# Patient Record
Sex: Female | Born: 1970
Health system: Southern US, Community
[De-identification: ages and names within clinical notes are randomized; demographics above are authoritative.]

## PROBLEM LIST (undated history)

## (undated) DIAGNOSIS — I2510 Atherosclerotic heart disease of native coronary artery without angina pectoris: Secondary | ICD-10-CM

## (undated) DIAGNOSIS — I951 Orthostatic hypotension: Secondary | ICD-10-CM

## (undated) DIAGNOSIS — E039 Hypothyroidism, unspecified: Secondary | ICD-10-CM

## (undated) DIAGNOSIS — E119 Type 2 diabetes mellitus without complications: Secondary | ICD-10-CM

## (undated) DIAGNOSIS — K635 Polyp of colon: Secondary | ICD-10-CM

## (undated) DIAGNOSIS — I712 Thoracic aortic aneurysm, without rupture: Secondary | ICD-10-CM

## (undated) DIAGNOSIS — D500 Iron deficiency anemia secondary to blood loss (chronic): Secondary | ICD-10-CM

## (undated) DIAGNOSIS — Z955 Presence of coronary angioplasty implant and graft: Secondary | ICD-10-CM

## (undated) DIAGNOSIS — I10 Essential (primary) hypertension: Secondary | ICD-10-CM

## (undated) DIAGNOSIS — I639 Cerebral infarction, unspecified: Secondary | ICD-10-CM

## (undated) DIAGNOSIS — D649 Anemia, unspecified: Secondary | ICD-10-CM

## (undated) DIAGNOSIS — A419 Sepsis, unspecified organism: Secondary | ICD-10-CM

## (undated) DIAGNOSIS — Z1371 Encounter for nonprocreative screening for genetic disease carrier status: Secondary | ICD-10-CM

## (undated) DIAGNOSIS — N183 Chronic kidney disease, stage 3 (moderate): Secondary | ICD-10-CM

## (undated) DIAGNOSIS — I7122 Aneurysm of the aortic arch, without rupture: Secondary | ICD-10-CM

## (undated) DIAGNOSIS — Z803 Family history of malignant neoplasm of breast: Secondary | ICD-10-CM

## (undated) DIAGNOSIS — Z9289 Personal history of other medical treatment: Secondary | ICD-10-CM

## (undated) DIAGNOSIS — I214 Non-ST elevation (NSTEMI) myocardial infarction: Secondary | ICD-10-CM

## (undated) DIAGNOSIS — C434 Malignant melanoma of scalp and neck: Secondary | ICD-10-CM

## (undated) DIAGNOSIS — N10 Acute tubulo-interstitial nephritis: Secondary | ICD-10-CM

## (undated) DIAGNOSIS — G709 Myoneural disorder, unspecified: Secondary | ICD-10-CM

## (undated) DIAGNOSIS — E785 Hyperlipidemia, unspecified: Secondary | ICD-10-CM

## (undated) DIAGNOSIS — F319 Bipolar disorder, unspecified: Secondary | ICD-10-CM

## (undated) HISTORY — PX: CORONARY ANGIOPLASTY: SHX604

## (undated) HISTORY — PX: OTHER SURGICAL HISTORY: SHX169

## (undated) HISTORY — DX: Non-ST elevation (NSTEMI) myocardial infarction: I21.4

## (undated) HISTORY — DX: Malignant melanoma of scalp and neck: C43.4

## (undated) HISTORY — DX: Aneurysm of the aortic arch, without rupture: I71.22

## (undated) HISTORY — DX: Essential (primary) hypertension: I10

## (undated) HISTORY — DX: Iron deficiency anemia secondary to blood loss (chronic): D50.0

## (undated) HISTORY — DX: Family history of malignant neoplasm of breast: Z80.3

## (undated) HISTORY — PX: APPENDECTOMY: SHX54

## (undated) HISTORY — DX: Cerebral infarction, unspecified: I63.9

## (undated) HISTORY — PX: DILATION AND CURETTAGE OF UTERUS: SHX78

## (undated) HISTORY — DX: Atherosclerotic heart disease of native coronary artery without angina pectoris: I25.10

## (undated) HISTORY — DX: Orthostatic hypotension: I95.1

## (undated) HISTORY — DX: Acute pyelonephritis: N10

## (undated) HISTORY — DX: Polyp of colon: K63.5

## (undated) HISTORY — DX: Thoracic aortic aneurysm, without rupture: I71.2

## (undated) HISTORY — PX: RIGHT OOPHORECTOMY: SHX2359

## (undated) HISTORY — DX: Sepsis, unspecified organism: A41.9

## (undated) HISTORY — DX: Encounter for nonprocreative screening for genetic disease carrier status: Z13.71

## (undated) HISTORY — DX: Personal history of other medical treatment: Z92.89

## (undated) HISTORY — PX: TRIGGER FINGER RELEASE: SHX641

---

## 1997-12-11 ENCOUNTER — Encounter: Admission: RE | Admit: 1997-12-11 | Discharge: 1997-12-11 | Payer: Self-pay | Admitting: *Deleted

## 1998-06-13 ENCOUNTER — Other Ambulatory Visit: Admission: RE | Admit: 1998-06-13 | Discharge: 1998-06-13 | Payer: Self-pay | Admitting: Obstetrics & Gynecology

## 1998-06-14 ENCOUNTER — Encounter: Admission: RE | Admit: 1998-06-14 | Discharge: 1998-06-14 | Payer: Self-pay | Admitting: Obstetrics & Gynecology

## 1998-06-28 ENCOUNTER — Emergency Department (HOSPITAL_COMMUNITY): Admission: EM | Admit: 1998-06-28 | Discharge: 1998-06-29 | Payer: Self-pay | Admitting: Emergency Medicine

## 1998-06-28 ENCOUNTER — Encounter: Payer: Self-pay | Admitting: Emergency Medicine

## 1998-07-28 ENCOUNTER — Emergency Department (HOSPITAL_COMMUNITY): Admission: EM | Admit: 1998-07-28 | Discharge: 1998-07-28 | Payer: Self-pay | Admitting: Emergency Medicine

## 1998-09-17 ENCOUNTER — Encounter: Admission: RE | Admit: 1998-09-17 | Discharge: 1998-09-17 | Payer: Self-pay | Admitting: Obstetrics & Gynecology

## 1999-01-02 ENCOUNTER — Emergency Department (HOSPITAL_COMMUNITY): Admission: EM | Admit: 1999-01-02 | Discharge: 1999-01-02 | Payer: Self-pay | Admitting: *Deleted

## 1999-01-03 ENCOUNTER — Encounter: Admission: RE | Admit: 1999-01-03 | Discharge: 1999-01-03 | Payer: Self-pay | Admitting: Obstetrics & Gynecology

## 1999-01-06 ENCOUNTER — Encounter: Admission: RE | Admit: 1999-01-06 | Discharge: 1999-01-06 | Payer: Self-pay | Admitting: Obstetrics & Gynecology

## 1999-01-20 ENCOUNTER — Emergency Department (HOSPITAL_COMMUNITY): Admission: EM | Admit: 1999-01-20 | Discharge: 1999-01-20 | Payer: Self-pay | Admitting: Emergency Medicine

## 1999-01-30 ENCOUNTER — Encounter: Admission: RE | Admit: 1999-01-30 | Discharge: 1999-01-30 | Payer: Self-pay | Admitting: Obstetrics

## 1999-02-04 ENCOUNTER — Encounter: Admission: RE | Admit: 1999-02-04 | Discharge: 1999-02-04 | Payer: Self-pay | Admitting: Obstetrics & Gynecology

## 1999-02-05 ENCOUNTER — Emergency Department (HOSPITAL_COMMUNITY): Admission: EM | Admit: 1999-02-05 | Discharge: 1999-02-05 | Payer: Self-pay | Admitting: Emergency Medicine

## 1999-05-04 ENCOUNTER — Inpatient Hospital Stay (HOSPITAL_COMMUNITY): Admission: AD | Admit: 1999-05-04 | Discharge: 1999-05-04 | Payer: Self-pay | Admitting: Obstetrics & Gynecology

## 1999-05-07 ENCOUNTER — Inpatient Hospital Stay (HOSPITAL_COMMUNITY): Admission: AD | Admit: 1999-05-07 | Discharge: 1999-05-07 | Payer: Self-pay | Admitting: *Deleted

## 1999-05-14 ENCOUNTER — Encounter: Admission: RE | Admit: 1999-05-14 | Discharge: 1999-05-14 | Payer: Self-pay | Admitting: Obstetrics & Gynecology

## 1999-05-14 ENCOUNTER — Ambulatory Visit (HOSPITAL_COMMUNITY): Admission: RE | Admit: 1999-05-14 | Discharge: 1999-05-14 | Payer: Self-pay | Admitting: Obstetrics

## 1999-05-14 ENCOUNTER — Encounter: Payer: Self-pay | Admitting: Obstetrics

## 1999-05-20 ENCOUNTER — Encounter: Admission: RE | Admit: 1999-05-20 | Discharge: 1999-08-18 | Payer: Self-pay | Admitting: Obstetrics & Gynecology

## 1999-05-28 ENCOUNTER — Encounter: Admission: RE | Admit: 1999-05-28 | Discharge: 1999-05-28 | Payer: Self-pay | Admitting: Obstetrics & Gynecology

## 1999-06-04 ENCOUNTER — Ambulatory Visit (HOSPITAL_COMMUNITY): Admission: RE | Admit: 1999-06-04 | Discharge: 1999-06-04 | Payer: Self-pay | Admitting: Obstetrics & Gynecology

## 1999-06-04 ENCOUNTER — Encounter: Payer: Self-pay | Admitting: Obstetrics & Gynecology

## 1999-06-04 ENCOUNTER — Inpatient Hospital Stay (HOSPITAL_COMMUNITY): Admission: AD | Admit: 1999-06-04 | Discharge: 1999-06-04 | Payer: Self-pay | Admitting: *Deleted

## 1999-06-04 ENCOUNTER — Encounter: Admission: RE | Admit: 1999-06-04 | Discharge: 1999-06-04 | Payer: Self-pay | Admitting: Obstetrics & Gynecology

## 1999-06-05 ENCOUNTER — Ambulatory Visit (HOSPITAL_COMMUNITY): Admission: RE | Admit: 1999-06-05 | Discharge: 1999-06-05 | Payer: Self-pay | Admitting: Obstetrics & Gynecology

## 1999-06-05 ENCOUNTER — Encounter (INDEPENDENT_AMBULATORY_CARE_PROVIDER_SITE_OTHER): Payer: Self-pay

## 1999-06-08 ENCOUNTER — Inpatient Hospital Stay (HOSPITAL_COMMUNITY): Admission: AD | Admit: 1999-06-08 | Discharge: 1999-06-08 | Payer: Self-pay | Admitting: *Deleted

## 1999-06-08 ENCOUNTER — Encounter: Payer: Self-pay | Admitting: *Deleted

## 1999-06-10 ENCOUNTER — Encounter: Admission: RE | Admit: 1999-06-10 | Discharge: 1999-06-10 | Payer: Self-pay | Admitting: Obstetrics & Gynecology

## 1999-06-24 ENCOUNTER — Encounter: Admission: RE | Admit: 1999-06-24 | Discharge: 1999-06-24 | Payer: Self-pay | Admitting: Obstetrics & Gynecology

## 1999-07-22 ENCOUNTER — Ambulatory Visit (HOSPITAL_COMMUNITY): Admission: RE | Admit: 1999-07-22 | Discharge: 1999-07-22 | Payer: Self-pay | Admitting: Obstetrics

## 1999-08-25 ENCOUNTER — Other Ambulatory Visit: Admission: RE | Admit: 1999-08-25 | Discharge: 1999-08-25 | Payer: Self-pay | Admitting: Obstetrics

## 1999-08-29 ENCOUNTER — Inpatient Hospital Stay (HOSPITAL_COMMUNITY): Admission: AD | Admit: 1999-08-29 | Discharge: 1999-08-29 | Payer: Self-pay | Admitting: Obstetrics

## 1999-09-05 ENCOUNTER — Encounter (HOSPITAL_COMMUNITY): Admission: RE | Admit: 1999-09-05 | Discharge: 1999-12-04 | Payer: Self-pay | Admitting: Obstetrics

## 2000-01-06 ENCOUNTER — Inpatient Hospital Stay (HOSPITAL_COMMUNITY): Admission: AD | Admit: 2000-01-06 | Discharge: 2000-01-06 | Payer: Self-pay | Admitting: Obstetrics

## 2000-01-13 ENCOUNTER — Encounter (INDEPENDENT_AMBULATORY_CARE_PROVIDER_SITE_OTHER): Payer: Self-pay

## 2000-01-13 ENCOUNTER — Inpatient Hospital Stay (HOSPITAL_COMMUNITY): Admission: AD | Admit: 2000-01-13 | Discharge: 2000-01-16 | Payer: Self-pay | Admitting: Obstetrics

## 2000-01-13 ENCOUNTER — Encounter: Payer: Self-pay | Admitting: Obstetrics

## 2000-02-03 HISTORY — PX: NOVASURE ABLATION: SHX5394

## 2000-03-12 ENCOUNTER — Encounter: Payer: Self-pay | Admitting: Obstetrics

## 2000-03-12 ENCOUNTER — Encounter (INDEPENDENT_AMBULATORY_CARE_PROVIDER_SITE_OTHER): Payer: Self-pay

## 2000-03-12 ENCOUNTER — Ambulatory Visit (HOSPITAL_COMMUNITY): Admission: AD | Admit: 2000-03-12 | Discharge: 2000-03-12 | Payer: Self-pay | Admitting: Obstetrics

## 2000-04-22 ENCOUNTER — Emergency Department (HOSPITAL_COMMUNITY): Admission: EM | Admit: 2000-04-22 | Discharge: 2000-04-22 | Payer: Self-pay | Admitting: Emergency Medicine

## 2000-04-22 ENCOUNTER — Encounter: Payer: Self-pay | Admitting: Emergency Medicine

## 2000-05-30 ENCOUNTER — Emergency Department (HOSPITAL_COMMUNITY): Admission: EM | Admit: 2000-05-30 | Discharge: 2000-05-30 | Payer: Self-pay | Admitting: Emergency Medicine

## 2001-05-20 ENCOUNTER — Emergency Department (HOSPITAL_COMMUNITY): Admission: EM | Admit: 2001-05-20 | Discharge: 2001-05-20 | Payer: Self-pay | Admitting: Emergency Medicine

## 2001-06-22 ENCOUNTER — Encounter: Payer: Self-pay | Admitting: Emergency Medicine

## 2001-06-22 ENCOUNTER — Emergency Department (HOSPITAL_COMMUNITY): Admission: EM | Admit: 2001-06-22 | Discharge: 2001-06-22 | Payer: Self-pay | Admitting: Emergency Medicine

## 2001-06-27 ENCOUNTER — Emergency Department (HOSPITAL_COMMUNITY): Admission: EM | Admit: 2001-06-27 | Discharge: 2001-06-27 | Payer: Self-pay | Admitting: Emergency Medicine

## 2001-11-03 ENCOUNTER — Encounter: Admission: RE | Admit: 2001-11-03 | Discharge: 2001-11-03 | Payer: Self-pay | Admitting: Family Medicine

## 2001-11-03 ENCOUNTER — Encounter: Payer: Self-pay | Admitting: Family Medicine

## 2001-11-08 ENCOUNTER — Encounter: Admission: RE | Admit: 2001-11-08 | Discharge: 2001-11-08 | Payer: Self-pay | Admitting: *Deleted

## 2001-11-08 ENCOUNTER — Encounter: Payer: Self-pay | Admitting: *Deleted

## 2001-11-21 ENCOUNTER — Other Ambulatory Visit: Admission: RE | Admit: 2001-11-21 | Discharge: 2001-11-21 | Payer: Self-pay | Admitting: Obstetrics and Gynecology

## 2001-12-12 ENCOUNTER — Observation Stay (HOSPITAL_COMMUNITY): Admission: RE | Admit: 2001-12-12 | Discharge: 2001-12-13 | Payer: Self-pay | Admitting: Obstetrics and Gynecology

## 2001-12-12 ENCOUNTER — Encounter (INDEPENDENT_AMBULATORY_CARE_PROVIDER_SITE_OTHER): Payer: Self-pay | Admitting: *Deleted

## 2002-07-10 ENCOUNTER — Encounter (INDEPENDENT_AMBULATORY_CARE_PROVIDER_SITE_OTHER): Payer: Self-pay | Admitting: Specialist

## 2002-07-10 ENCOUNTER — Encounter: Payer: Self-pay | Admitting: Family Medicine

## 2002-07-10 ENCOUNTER — Ambulatory Visit (HOSPITAL_COMMUNITY): Admission: RE | Admit: 2002-07-10 | Discharge: 2002-07-10 | Payer: Self-pay | Admitting: Internal Medicine

## 2002-07-25 ENCOUNTER — Inpatient Hospital Stay (HOSPITAL_COMMUNITY): Admission: AD | Admit: 2002-07-25 | Discharge: 2002-07-28 | Payer: Self-pay | Admitting: Internal Medicine

## 2002-07-26 ENCOUNTER — Encounter: Payer: Self-pay | Admitting: Internal Medicine

## 2002-07-27 ENCOUNTER — Encounter: Payer: Self-pay | Admitting: Internal Medicine

## 2002-09-19 ENCOUNTER — Encounter: Admission: RE | Admit: 2002-09-19 | Discharge: 2002-09-19 | Payer: Self-pay | Admitting: Family Medicine

## 2002-09-19 ENCOUNTER — Encounter: Payer: Self-pay | Admitting: Family Medicine

## 2003-08-08 ENCOUNTER — Inpatient Hospital Stay (HOSPITAL_COMMUNITY): Admission: AD | Admit: 2003-08-08 | Discharge: 2003-08-10 | Payer: Self-pay | Admitting: Cardiology

## 2004-06-25 ENCOUNTER — Encounter: Admission: RE | Admit: 2004-06-25 | Discharge: 2004-06-25 | Payer: Self-pay | Admitting: Family Medicine

## 2004-08-22 ENCOUNTER — Ambulatory Visit (HOSPITAL_COMMUNITY): Admission: RE | Admit: 2004-08-22 | Discharge: 2004-08-22 | Payer: Self-pay | Admitting: Obstetrics

## 2004-09-21 ENCOUNTER — Emergency Department (HOSPITAL_COMMUNITY): Admission: EM | Admit: 2004-09-21 | Discharge: 2004-09-21 | Payer: Self-pay | Admitting: Family Medicine

## 2005-08-13 ENCOUNTER — Encounter (INDEPENDENT_AMBULATORY_CARE_PROVIDER_SITE_OTHER): Payer: Self-pay | Admitting: Specialist

## 2005-08-13 ENCOUNTER — Ambulatory Visit (HOSPITAL_COMMUNITY): Admission: RE | Admit: 2005-08-13 | Discharge: 2005-08-13 | Payer: Self-pay | Admitting: Obstetrics

## 2006-09-04 IMAGING — CR Imaging study
3 series · 3 of 3 positions shown · non-contrast
Comparison: none

CLINICAL DATA: Right ankle twisting injury.  Lateral pain and swelling. 
 RIGHT ANKLE - 3 VIEW:
 There is no evidence of fracture or dislocation.  Mild peripheral vascular calcification is noted.  Prominent dorsal and plantar calcaneal spurs are also noted.

[view not recorded (1 of 3)]
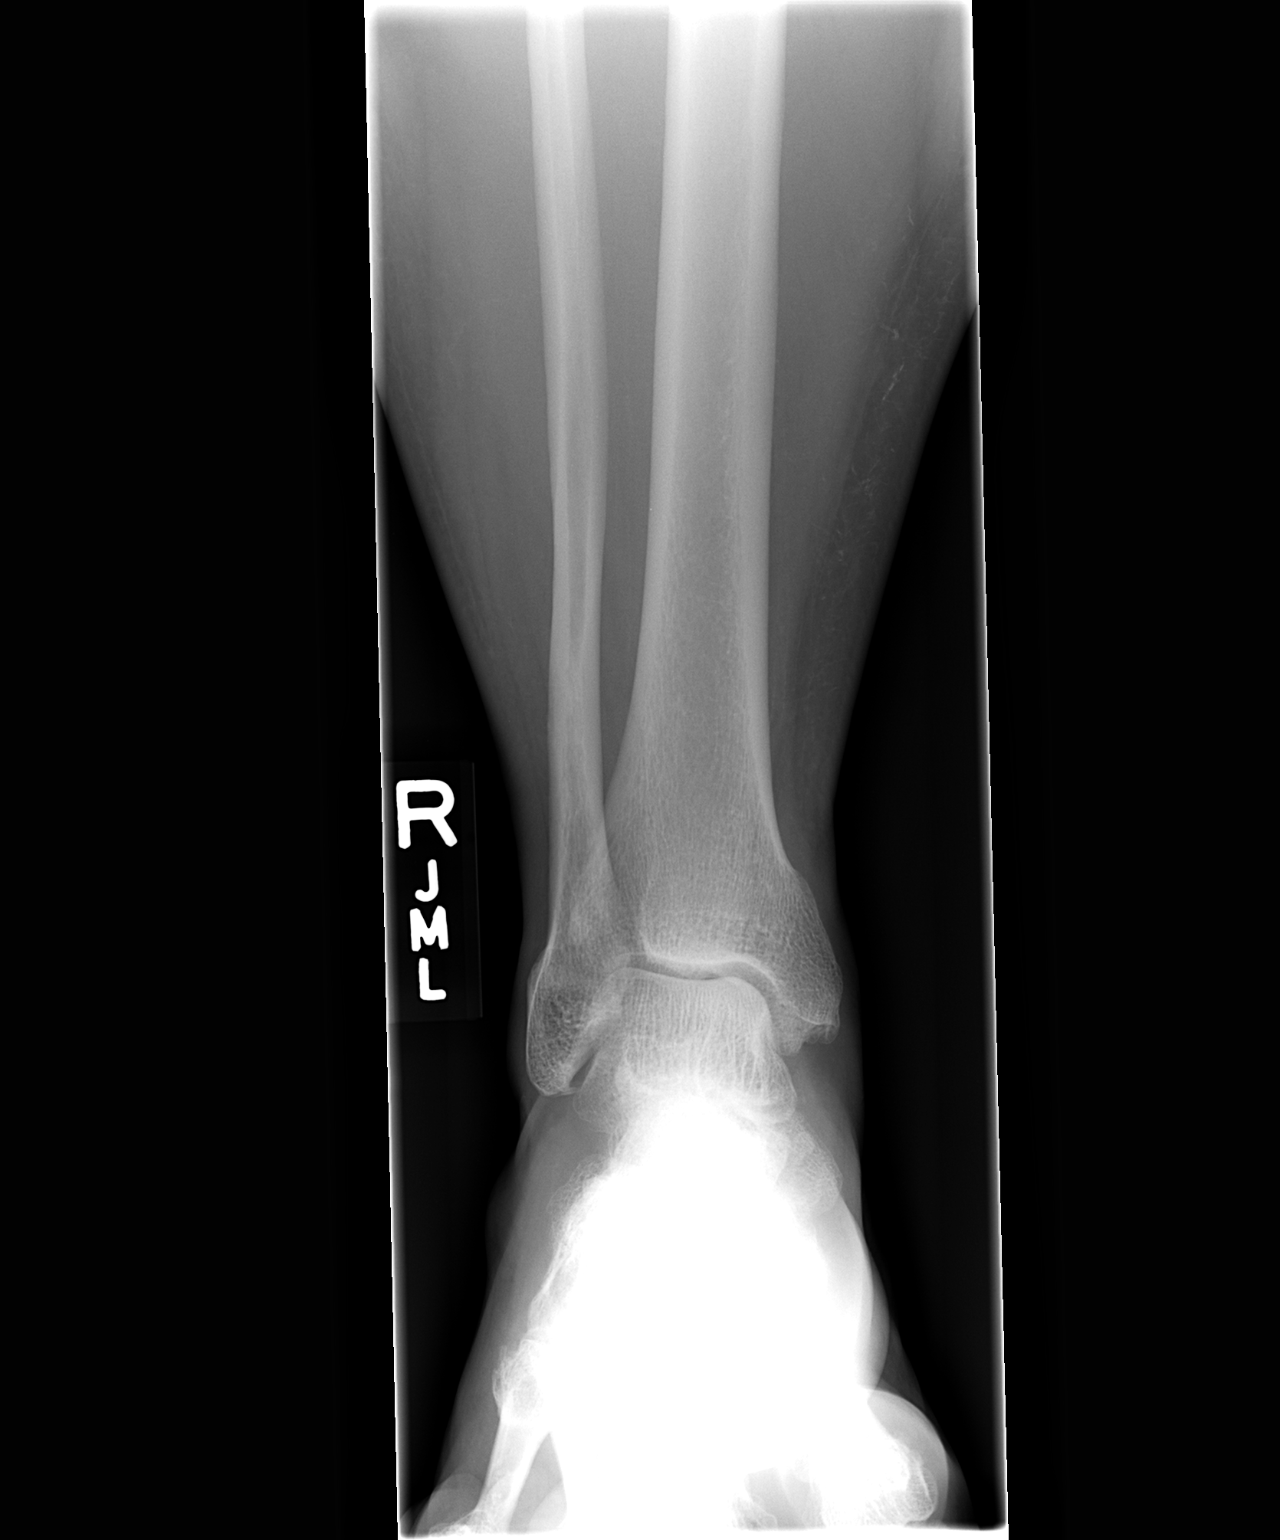

[view not recorded (2 of 3)]
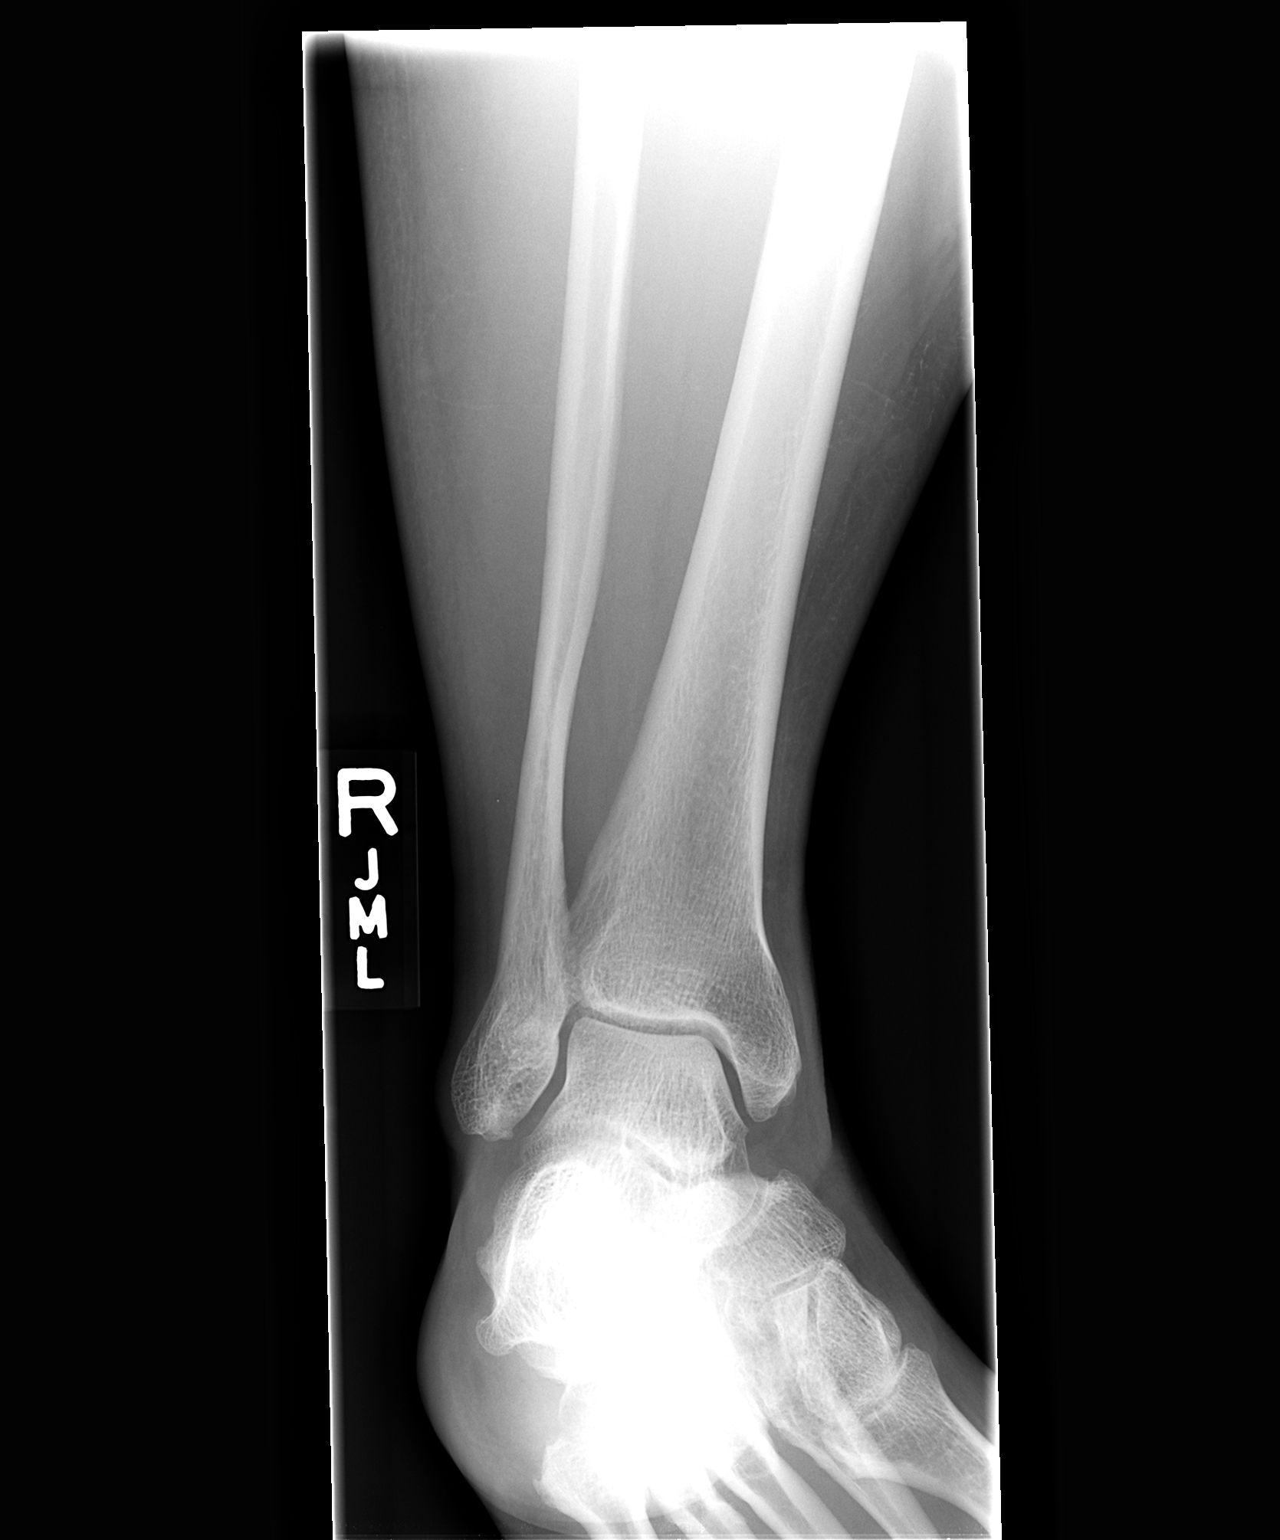

[view not recorded (3 of 3)]
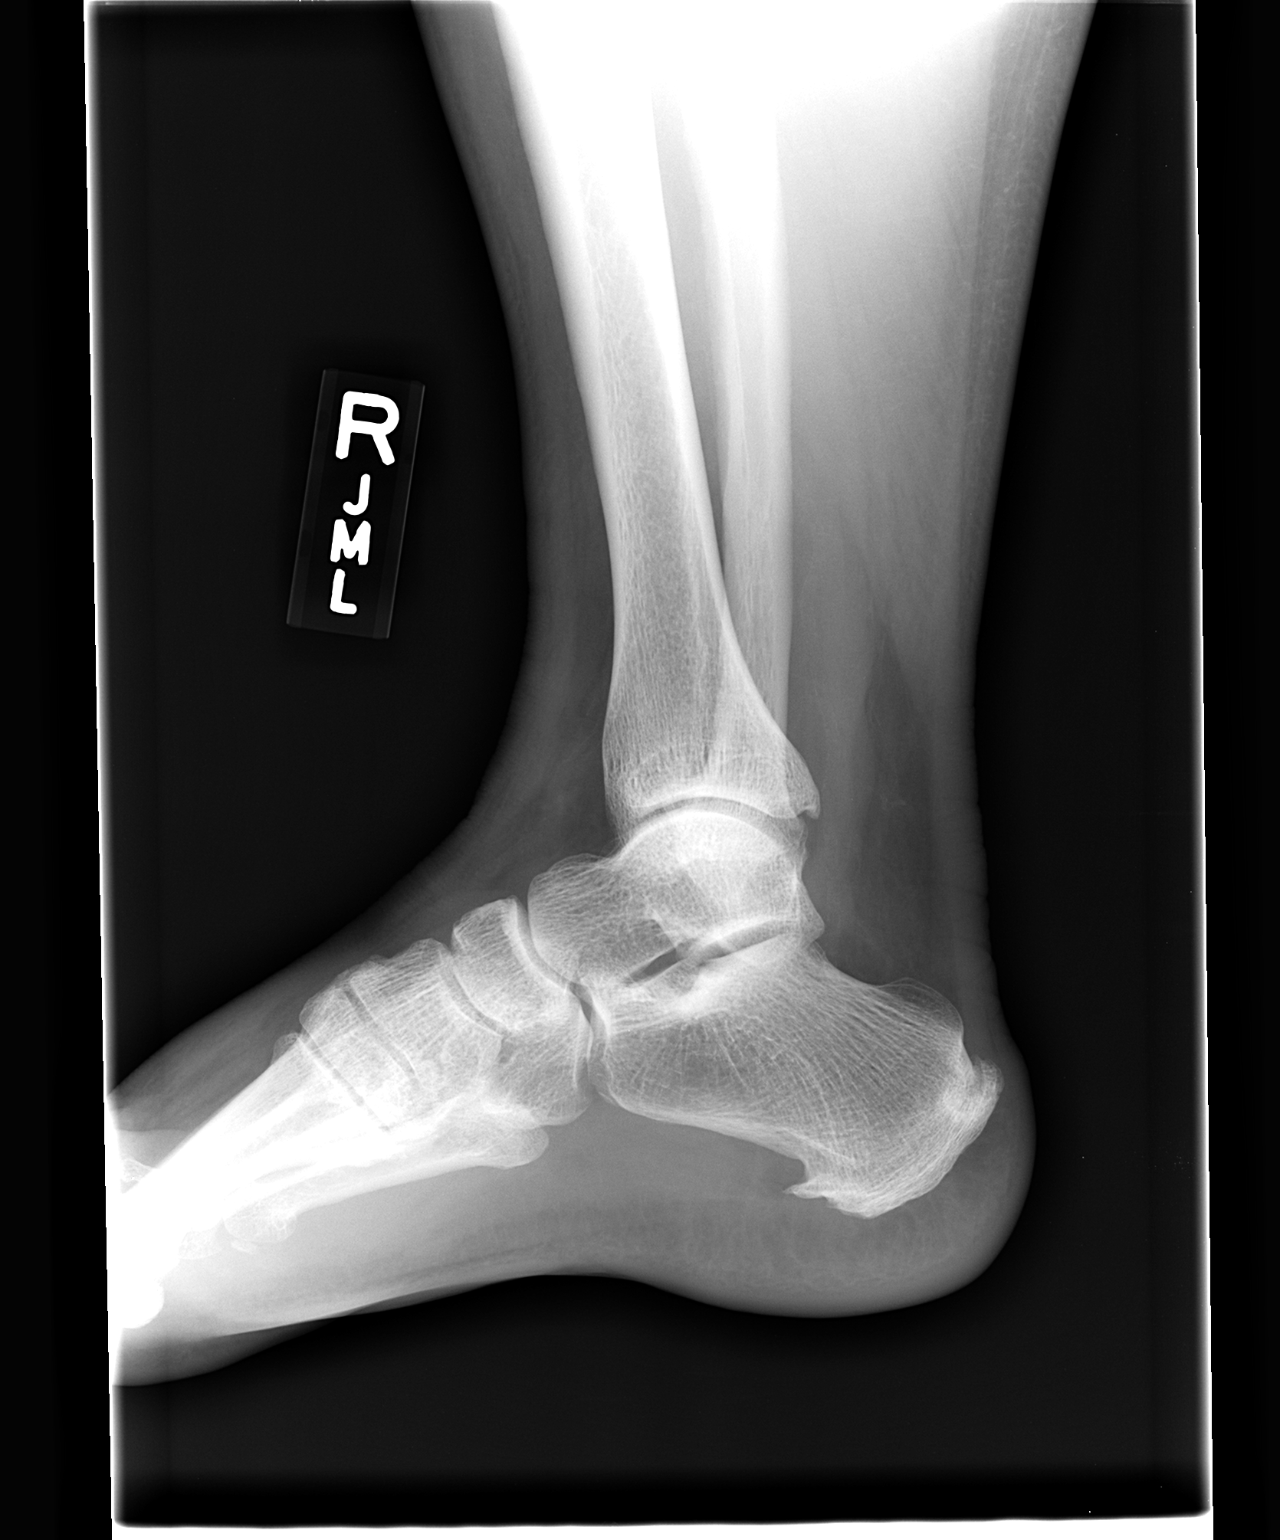

[3 of 3 positions shown; findings below may reference images not displayed]

IMPRESSION: No acute findings.

## 2007-02-12 ENCOUNTER — Emergency Department (HOSPITAL_COMMUNITY): Admission: EM | Admit: 2007-02-12 | Discharge: 2007-02-12 | Payer: Self-pay | Admitting: Family Medicine

## 2007-08-09 ENCOUNTER — Ambulatory Visit: Payer: Self-pay | Admitting: Cardiology

## 2007-08-12 ENCOUNTER — Encounter: Payer: Self-pay | Admitting: Cardiology

## 2007-08-12 ENCOUNTER — Ambulatory Visit: Payer: Self-pay

## 2007-09-06 ENCOUNTER — Encounter: Admission: RE | Admit: 2007-09-06 | Discharge: 2007-10-27 | Payer: Self-pay | Admitting: Orthopedic Surgery

## 2007-12-26 ENCOUNTER — Emergency Department (HOSPITAL_COMMUNITY): Admission: EM | Admit: 2007-12-26 | Discharge: 2007-12-26 | Payer: Self-pay | Admitting: Emergency Medicine

## 2008-04-02 ENCOUNTER — Encounter (INDEPENDENT_AMBULATORY_CARE_PROVIDER_SITE_OTHER): Payer: Self-pay | Admitting: *Deleted

## 2008-04-02 ENCOUNTER — Encounter: Payer: Self-pay | Admitting: Family Medicine

## 2008-04-02 LAB — CONVERTED CEMR LAB
Alkaline Phosphatase: 65 units/L
Chloride, Serum: 100 mmol/L
Cholesterol: 174 mg/dL
Creatinine, Ser: 0.9 mg/dL
HDL: 33 mg/dL
Hgb A1c MFr Bld: 8 %
Total Bilirubin: 0.4 mg/dL
Total Protein: 7.6 g/dL

## 2008-04-19 ENCOUNTER — Encounter (INDEPENDENT_AMBULATORY_CARE_PROVIDER_SITE_OTHER): Payer: Self-pay | Admitting: *Deleted

## 2008-05-01 ENCOUNTER — Ambulatory Visit: Payer: Self-pay | Admitting: Family Medicine

## 2008-05-01 DIAGNOSIS — I10 Essential (primary) hypertension: Secondary | ICD-10-CM

## 2008-05-01 DIAGNOSIS — F418 Other specified anxiety disorders: Secondary | ICD-10-CM

## 2008-05-08 ENCOUNTER — Encounter (INDEPENDENT_AMBULATORY_CARE_PROVIDER_SITE_OTHER): Payer: Self-pay | Admitting: *Deleted

## 2008-05-11 ENCOUNTER — Telehealth (INDEPENDENT_AMBULATORY_CARE_PROVIDER_SITE_OTHER): Payer: Self-pay | Admitting: *Deleted

## 2008-05-11 ENCOUNTER — Ambulatory Visit: Payer: Self-pay | Admitting: *Deleted

## 2008-05-14 ENCOUNTER — Encounter (INDEPENDENT_AMBULATORY_CARE_PROVIDER_SITE_OTHER): Payer: Self-pay | Admitting: *Deleted

## 2008-06-09 ENCOUNTER — Inpatient Hospital Stay (HOSPITAL_COMMUNITY): Admission: EM | Admit: 2008-06-09 | Discharge: 2008-06-15 | Payer: Self-pay | Admitting: Emergency Medicine

## 2008-06-09 ENCOUNTER — Ambulatory Visit: Payer: Self-pay | Admitting: Internal Medicine

## 2008-06-12 ENCOUNTER — Encounter: Payer: Self-pay | Admitting: Family Medicine

## 2008-06-13 ENCOUNTER — Ambulatory Visit: Payer: Self-pay | Admitting: Physical Medicine & Rehabilitation

## 2008-06-14 ENCOUNTER — Ambulatory Visit: Payer: Self-pay | Admitting: Cardiology

## 2008-06-15 ENCOUNTER — Encounter: Payer: Self-pay | Admitting: Internal Medicine

## 2008-06-21 ENCOUNTER — Ambulatory Visit: Payer: Self-pay | Admitting: Family Medicine

## 2008-06-21 DIAGNOSIS — I639 Cerebral infarction, unspecified: Secondary | ICD-10-CM | POA: Insufficient documentation

## 2008-06-25 ENCOUNTER — Telehealth (INDEPENDENT_AMBULATORY_CARE_PROVIDER_SITE_OTHER): Payer: Self-pay | Admitting: *Deleted

## 2008-07-10 ENCOUNTER — Encounter (INDEPENDENT_AMBULATORY_CARE_PROVIDER_SITE_OTHER): Payer: Self-pay | Admitting: *Deleted

## 2008-07-10 ENCOUNTER — Telehealth (INDEPENDENT_AMBULATORY_CARE_PROVIDER_SITE_OTHER): Payer: Self-pay | Admitting: *Deleted

## 2008-07-23 ENCOUNTER — Ambulatory Visit: Payer: Self-pay | Admitting: Family Medicine

## 2008-07-23 DIAGNOSIS — R209 Unspecified disturbances of skin sensation: Secondary | ICD-10-CM

## 2008-07-26 DIAGNOSIS — R5383 Other fatigue: Secondary | ICD-10-CM

## 2008-07-26 DIAGNOSIS — R5381 Other malaise: Secondary | ICD-10-CM

## 2008-07-31 ENCOUNTER — Telehealth (INDEPENDENT_AMBULATORY_CARE_PROVIDER_SITE_OTHER): Payer: Self-pay | Admitting: *Deleted

## 2008-08-27 ENCOUNTER — Ambulatory Visit: Payer: Self-pay | Admitting: Family Medicine

## 2008-08-27 DIAGNOSIS — B359 Dermatophytosis, unspecified: Secondary | ICD-10-CM | POA: Insufficient documentation

## 2008-08-31 ENCOUNTER — Telehealth (INDEPENDENT_AMBULATORY_CARE_PROVIDER_SITE_OTHER): Payer: Self-pay | Admitting: *Deleted

## 2008-09-05 ENCOUNTER — Encounter (INDEPENDENT_AMBULATORY_CARE_PROVIDER_SITE_OTHER): Payer: Self-pay | Admitting: *Deleted

## 2008-09-12 ENCOUNTER — Telehealth (INDEPENDENT_AMBULATORY_CARE_PROVIDER_SITE_OTHER): Payer: Self-pay | Admitting: *Deleted

## 2008-09-17 ENCOUNTER — Telehealth (INDEPENDENT_AMBULATORY_CARE_PROVIDER_SITE_OTHER): Payer: Self-pay | Admitting: *Deleted

## 2008-09-19 ENCOUNTER — Ambulatory Visit: Payer: Self-pay | Admitting: Family Medicine

## 2008-09-19 DIAGNOSIS — L259 Unspecified contact dermatitis, unspecified cause: Secondary | ICD-10-CM

## 2008-10-10 ENCOUNTER — Telehealth (INDEPENDENT_AMBULATORY_CARE_PROVIDER_SITE_OTHER): Payer: Self-pay | Admitting: *Deleted

## 2008-10-11 ENCOUNTER — Telehealth (INDEPENDENT_AMBULATORY_CARE_PROVIDER_SITE_OTHER): Payer: Self-pay | Admitting: *Deleted

## 2008-10-15 ENCOUNTER — Telehealth (INDEPENDENT_AMBULATORY_CARE_PROVIDER_SITE_OTHER): Payer: Self-pay | Admitting: *Deleted

## 2008-10-16 ENCOUNTER — Telehealth (INDEPENDENT_AMBULATORY_CARE_PROVIDER_SITE_OTHER): Payer: Self-pay | Admitting: *Deleted

## 2008-10-22 ENCOUNTER — Encounter: Payer: Self-pay | Admitting: Family Medicine

## 2008-10-24 ENCOUNTER — Telehealth (INDEPENDENT_AMBULATORY_CARE_PROVIDER_SITE_OTHER): Payer: Self-pay | Admitting: *Deleted

## 2008-11-08 ENCOUNTER — Telehealth (INDEPENDENT_AMBULATORY_CARE_PROVIDER_SITE_OTHER): Payer: Self-pay | Admitting: *Deleted

## 2008-11-14 ENCOUNTER — Encounter (INDEPENDENT_AMBULATORY_CARE_PROVIDER_SITE_OTHER): Payer: Self-pay | Admitting: *Deleted

## 2008-11-16 ENCOUNTER — Telehealth (INDEPENDENT_AMBULATORY_CARE_PROVIDER_SITE_OTHER): Payer: Self-pay | Admitting: *Deleted

## 2008-11-21 ENCOUNTER — Telehealth (INDEPENDENT_AMBULATORY_CARE_PROVIDER_SITE_OTHER): Payer: Self-pay | Admitting: *Deleted

## 2008-12-05 ENCOUNTER — Ambulatory Visit: Payer: Self-pay | Admitting: Family

## 2008-12-05 DIAGNOSIS — R351 Nocturia: Secondary | ICD-10-CM | POA: Insufficient documentation

## 2008-12-05 LAB — CONVERTED CEMR LAB
BUN: 11 mg/dL (ref 6–23)
Basophils Absolute: 0 10*3/uL (ref 0.0–0.1)
Basophils Relative: 0.1 % (ref 0.0–3.0)
CO2: 28 meq/L (ref 19–32)
Chloride: 103 meq/L (ref 96–112)
Eosinophils Absolute: 0.6 10*3/uL (ref 0.0–0.7)
Eosinophils Relative: 5.5 % — ABNORMAL HIGH (ref 0.0–5.0)
Glucose, Bld: 87 mg/dL (ref 70–99)
Hgb A1c MFr Bld: 7.5 % — ABNORMAL HIGH (ref 4.6–6.5)
Lymphocytes Relative: 18.6 % (ref 12.0–46.0)
Lymphs Abs: 1.9 10*3/uL (ref 0.7–4.0)
Monocytes Absolute: 0.6 10*3/uL (ref 0.1–1.0)
Neutro Abs: 6.9 10*3/uL (ref 1.4–7.7)
Platelets: 253 10*3/uL (ref 150.0–400.0)
Potassium: 4.2 meq/L (ref 3.5–5.1)
RBC: 5.11 M/uL (ref 3.87–5.11)
Sodium: 140 meq/L (ref 135–145)
TSH: 1.79 microintl units/mL (ref 0.35–5.50)

## 2008-12-07 ENCOUNTER — Encounter: Payer: Self-pay | Admitting: Family

## 2008-12-10 ENCOUNTER — Encounter: Payer: Self-pay | Admitting: Family

## 2008-12-10 ENCOUNTER — Telehealth (INDEPENDENT_AMBULATORY_CARE_PROVIDER_SITE_OTHER): Payer: Self-pay | Admitting: *Deleted

## 2008-12-18 ENCOUNTER — Telehealth (INDEPENDENT_AMBULATORY_CARE_PROVIDER_SITE_OTHER): Payer: Self-pay | Admitting: *Deleted

## 2008-12-26 ENCOUNTER — Telehealth (INDEPENDENT_AMBULATORY_CARE_PROVIDER_SITE_OTHER): Payer: Self-pay | Admitting: *Deleted

## 2009-01-25 IMAGING — CR Imaging study
2 series · 2 of 2 positions shown · non-contrast
Comparison: none

CLINICAL DATA: Fall.  
 LEFT HUMERUS - 2 VIEW:

[view not recorded (1 of 2)]
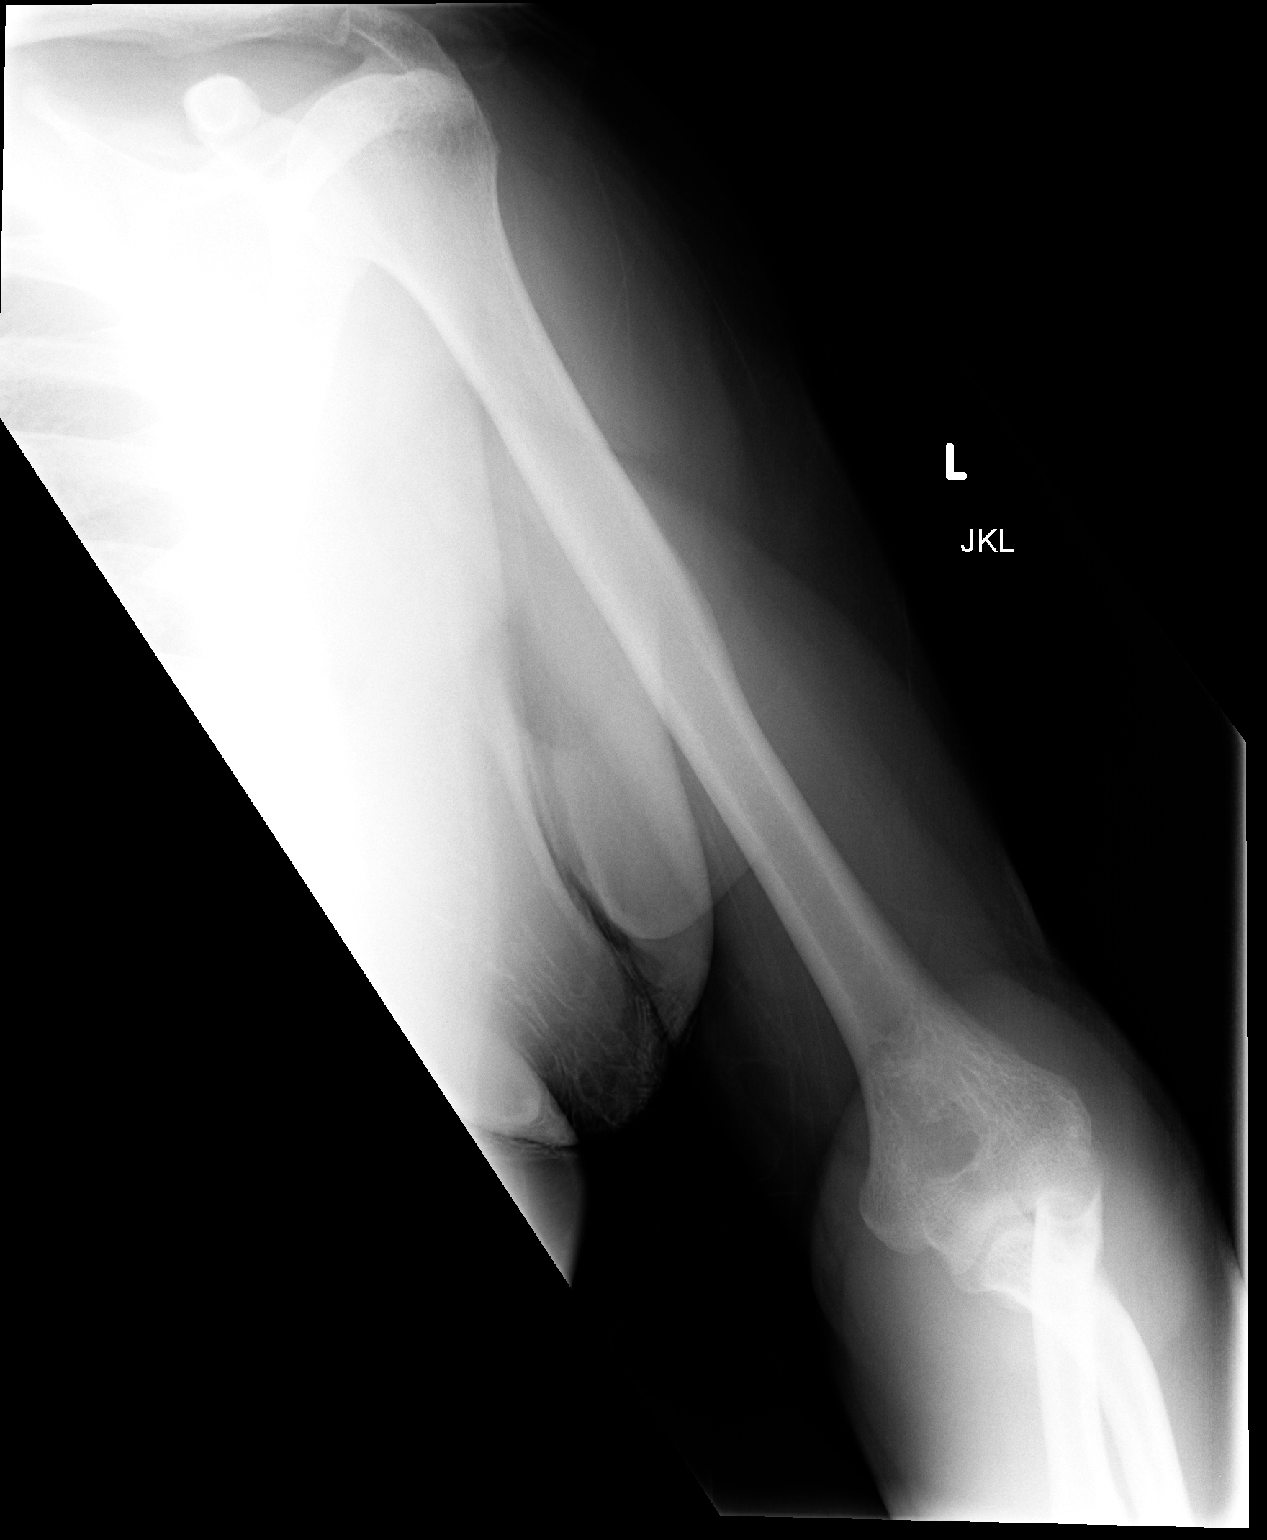

[view not recorded (2 of 2)]
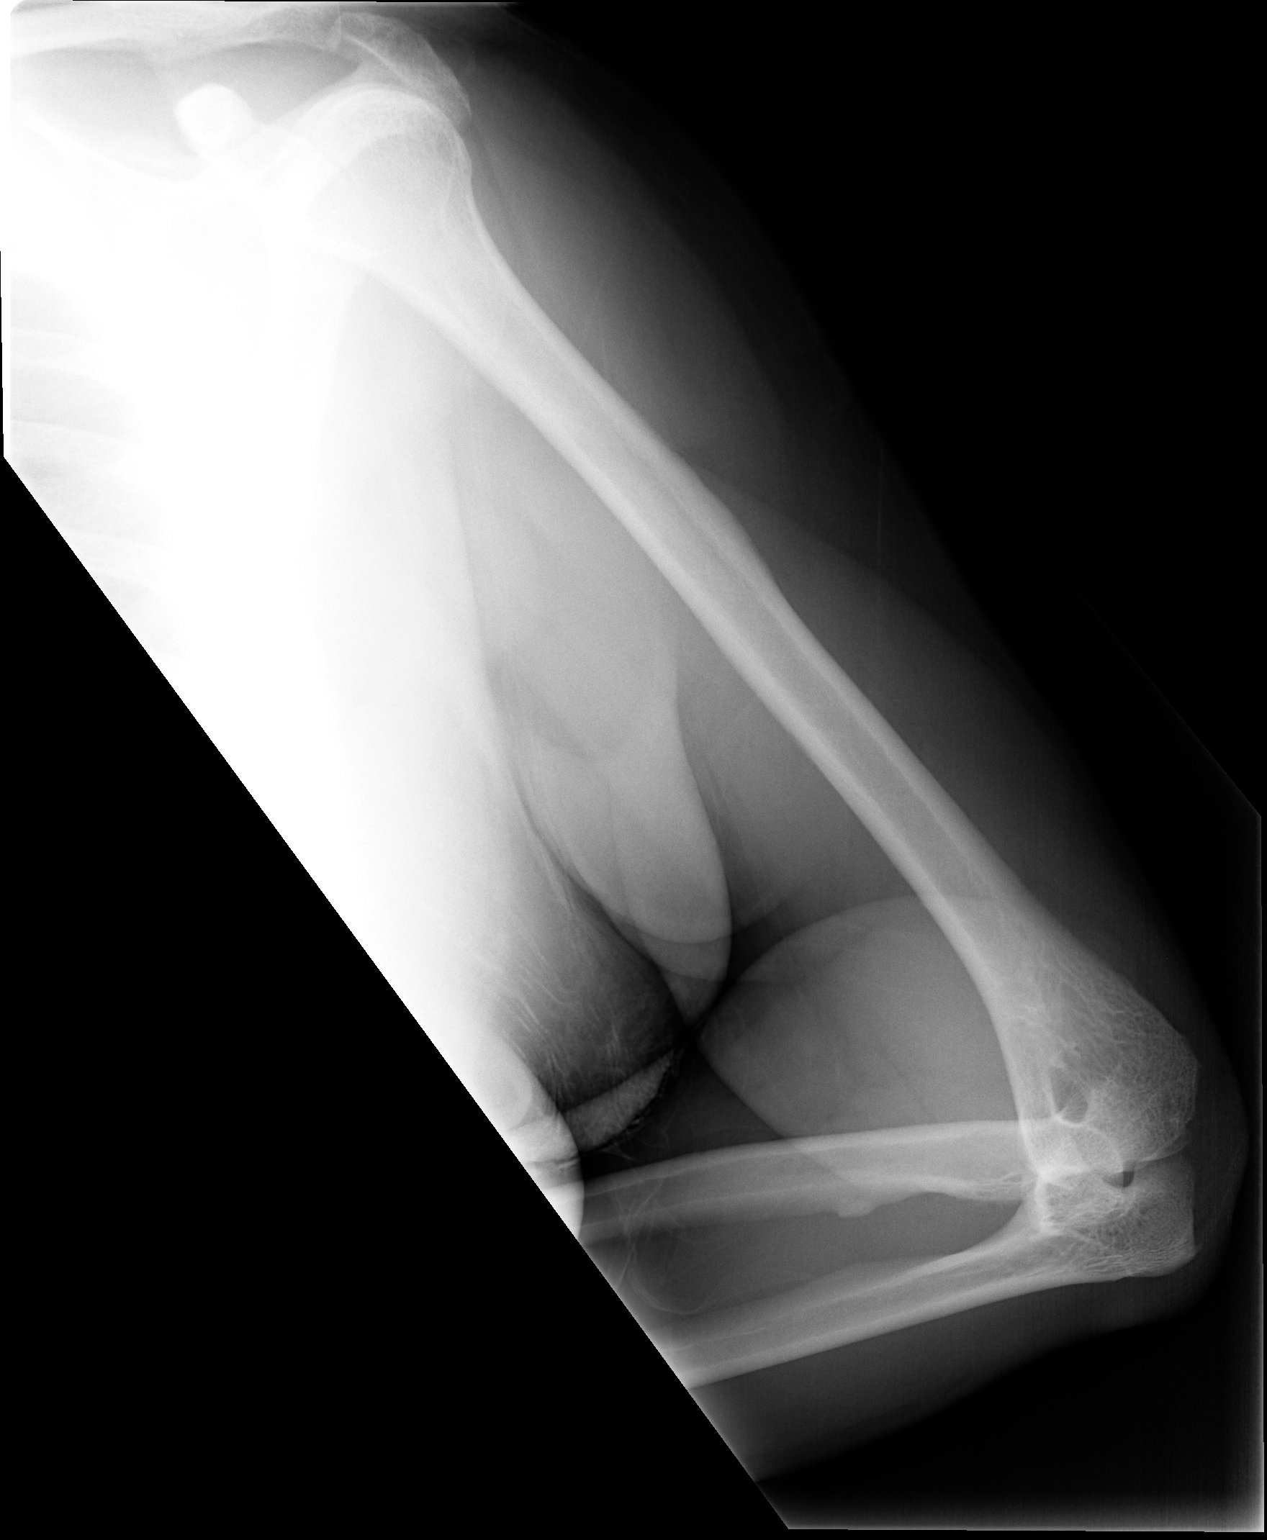

[2 of 2 positions shown; findings below may reference images not displayed]

FINDINGS: There is no evidence of fracture or other focal bone lesions.  Soft tissues are unremarkable.
IMPRESSION: Negative.

## 2009-02-18 ENCOUNTER — Telehealth (INDEPENDENT_AMBULATORY_CARE_PROVIDER_SITE_OTHER): Payer: Self-pay | Admitting: *Deleted

## 2009-03-04 ENCOUNTER — Telehealth (INDEPENDENT_AMBULATORY_CARE_PROVIDER_SITE_OTHER): Payer: Self-pay | Admitting: *Deleted

## 2009-03-11 ENCOUNTER — Ambulatory Visit: Payer: Self-pay | Admitting: Family

## 2009-03-11 ENCOUNTER — Telehealth (INDEPENDENT_AMBULATORY_CARE_PROVIDER_SITE_OTHER): Payer: Self-pay | Admitting: *Deleted

## 2009-03-11 LAB — CONVERTED CEMR LAB
ALT: 29 units/L (ref 0–35)
AST: 21 units/L (ref 0–37)
Alkaline Phosphatase: 60 units/L (ref 39–117)
Bilirubin Urine: NEGATIVE
Bilirubin, Direct: 0 mg/dL (ref 0.0–0.3)
Blood in Urine, dipstick: NEGATIVE
Creatinine,U: 45.6 mg/dL
Glucose, Bld: 81 mg/dL (ref 70–99)
Glucose, Urine, Semiquant: NEGATIVE
Hgb A1c MFr Bld: 8.2 % — ABNORMAL HIGH (ref 4.6–6.5)
LDL Cholesterol: 32 mg/dL (ref 0–99)
Microalb Creat Ratio: 6.6 mg/g (ref 0.0–30.0)
Protein, U semiquant: NEGATIVE
Total Bilirubin: 0.5 mg/dL (ref 0.3–1.2)
Total Protein: 7.1 g/dL (ref 6.0–8.3)
Triglycerides: 136 mg/dL (ref 0.0–149.0)
Urobilinogen, UA: 0.2

## 2009-03-12 ENCOUNTER — Encounter (INDEPENDENT_AMBULATORY_CARE_PROVIDER_SITE_OTHER): Payer: Self-pay | Admitting: *Deleted

## 2009-03-12 ENCOUNTER — Encounter: Payer: Self-pay | Admitting: Family Medicine

## 2009-03-19 ENCOUNTER — Telehealth (INDEPENDENT_AMBULATORY_CARE_PROVIDER_SITE_OTHER): Payer: Self-pay | Admitting: *Deleted

## 2009-04-01 ENCOUNTER — Telehealth (INDEPENDENT_AMBULATORY_CARE_PROVIDER_SITE_OTHER): Payer: Self-pay | Admitting: *Deleted

## 2009-04-24 ENCOUNTER — Telehealth: Payer: Self-pay | Admitting: Cardiology

## 2009-05-06 ENCOUNTER — Telehealth (INDEPENDENT_AMBULATORY_CARE_PROVIDER_SITE_OTHER): Payer: Self-pay | Admitting: *Deleted

## 2009-05-10 ENCOUNTER — Ambulatory Visit: Payer: Self-pay | Admitting: Family Medicine

## 2009-06-05 ENCOUNTER — Ambulatory Visit: Payer: Self-pay | Admitting: Cardiology

## 2009-06-12 ENCOUNTER — Ambulatory Visit: Payer: Self-pay | Admitting: Family Medicine

## 2009-06-14 ENCOUNTER — Telehealth (INDEPENDENT_AMBULATORY_CARE_PROVIDER_SITE_OTHER): Payer: Self-pay | Admitting: *Deleted

## 2009-06-14 LAB — CONVERTED CEMR LAB: Hgb A1c MFr Bld: 8.6 % — ABNORMAL HIGH (ref 4.6–6.5)

## 2009-06-20 ENCOUNTER — Telehealth: Payer: Self-pay | Admitting: Cardiology

## 2009-07-04 ENCOUNTER — Telehealth (INDEPENDENT_AMBULATORY_CARE_PROVIDER_SITE_OTHER): Payer: Self-pay | Admitting: *Deleted

## 2009-07-10 ENCOUNTER — Observation Stay (HOSPITAL_COMMUNITY): Admission: EM | Admit: 2009-07-10 | Discharge: 2009-07-11 | Payer: Self-pay | Admitting: Emergency Medicine

## 2009-07-10 ENCOUNTER — Ambulatory Visit: Payer: Self-pay | Admitting: Cardiovascular Disease

## 2009-07-11 ENCOUNTER — Encounter (INDEPENDENT_AMBULATORY_CARE_PROVIDER_SITE_OTHER): Payer: Self-pay | Admitting: Internal Medicine

## 2009-07-15 ENCOUNTER — Telehealth (INDEPENDENT_AMBULATORY_CARE_PROVIDER_SITE_OTHER): Payer: Self-pay | Admitting: *Deleted

## 2009-07-16 ENCOUNTER — Telehealth (INDEPENDENT_AMBULATORY_CARE_PROVIDER_SITE_OTHER): Payer: Self-pay | Admitting: *Deleted

## 2009-07-22 ENCOUNTER — Telehealth (INDEPENDENT_AMBULATORY_CARE_PROVIDER_SITE_OTHER): Payer: Self-pay | Admitting: *Deleted

## 2009-08-15 ENCOUNTER — Telehealth: Payer: Self-pay | Admitting: Family Medicine

## 2009-08-19 ENCOUNTER — Telehealth: Payer: Self-pay | Admitting: Family Medicine

## 2009-08-22 ENCOUNTER — Encounter: Payer: Self-pay | Admitting: Family Medicine

## 2009-08-22 ENCOUNTER — Telehealth (INDEPENDENT_AMBULATORY_CARE_PROVIDER_SITE_OTHER): Payer: Self-pay | Admitting: *Deleted

## 2009-09-02 ENCOUNTER — Encounter: Payer: Self-pay | Admitting: Family Medicine

## 2009-09-02 HISTORY — PX: GASTRIC BYPASS: SHX52

## 2009-09-03 ENCOUNTER — Telehealth (INDEPENDENT_AMBULATORY_CARE_PROVIDER_SITE_OTHER): Payer: Self-pay | Admitting: *Deleted

## 2009-09-06 ENCOUNTER — Encounter: Payer: Self-pay | Admitting: Family Medicine

## 2009-09-16 ENCOUNTER — Ambulatory Visit: Payer: Self-pay | Admitting: Family Medicine

## 2009-09-16 ENCOUNTER — Other Ambulatory Visit: Admission: RE | Admit: 2009-09-16 | Discharge: 2009-09-16 | Payer: Self-pay | Admitting: Family Medicine

## 2009-09-16 LAB — HM PAP SMEAR: HM Pap smear: NORMAL

## 2009-09-17 ENCOUNTER — Encounter: Payer: Self-pay | Admitting: Family Medicine

## 2009-09-17 LAB — CONVERTED CEMR LAB
AST: 13 units/L (ref 0–37)
Albumin: 3.7 g/dL (ref 3.5–5.2)
BUN: 14 mg/dL (ref 6–23)
Basophils Relative: 0.6 % (ref 0.0–3.0)
Bilirubin, Direct: 0.1 mg/dL (ref 0.0–0.3)
Chloride: 104 meq/L (ref 96–112)
Cholesterol: 103 mg/dL (ref 0–200)
Creatinine, Ser: 1 mg/dL (ref 0.4–1.2)
Eosinophils Relative: 1.3 % (ref 0.0–5.0)
GFR calc non Af Amer: 68.94 mL/min (ref >60)
Glucose, Bld: 170 mg/dL — ABNORMAL HIGH (ref 70–99)
LDL Cholesterol: 58 mg/dL (ref 0–99)
Lymphocytes Relative: 18.2 % (ref 12.0–46.0)
Lymphs Abs: 2.1 10*3/uL (ref 0.7–4.0)
MCV: 74.4 fL — ABNORMAL LOW (ref 78.0–100.0)
Monocytes Absolute: 0.6 10*3/uL (ref 0.1–1.0)
Neutro Abs: 8.8 10*3/uL — ABNORMAL HIGH (ref 1.4–7.7)
Neutrophils Relative %: 75 % (ref 43.0–77.0)
Potassium: 4.9 meq/L (ref 3.5–5.1)
RBC: 5.39 M/uL — ABNORMAL HIGH (ref 3.87–5.11)
RDW: 17.4 % — ABNORMAL HIGH (ref 11.5–14.6)
TSH: 1.21 microintl units/mL (ref 0.35–5.50)
WBC: 11.7 10*3/uL — ABNORMAL HIGH (ref 4.5–10.5)

## 2009-09-18 ENCOUNTER — Encounter (INDEPENDENT_AMBULATORY_CARE_PROVIDER_SITE_OTHER): Payer: Self-pay | Admitting: *Deleted

## 2009-09-18 LAB — CONVERTED CEMR LAB: Pap Smear: NEGATIVE

## 2009-09-19 ENCOUNTER — Encounter: Admission: RE | Admit: 2009-09-19 | Discharge: 2009-09-19 | Payer: Self-pay | Admitting: Family Medicine

## 2009-09-19 ENCOUNTER — Telehealth (INDEPENDENT_AMBULATORY_CARE_PROVIDER_SITE_OTHER): Payer: Self-pay | Admitting: *Deleted

## 2009-09-23 ENCOUNTER — Encounter: Payer: Self-pay | Admitting: Family Medicine

## 2009-09-26 ENCOUNTER — Encounter: Payer: Self-pay | Admitting: Family Medicine

## 2009-10-14 ENCOUNTER — Encounter: Payer: Self-pay | Admitting: Family

## 2009-11-04 ENCOUNTER — Telehealth: Payer: Self-pay | Admitting: Family Medicine

## 2009-11-21 ENCOUNTER — Emergency Department (HOSPITAL_COMMUNITY): Admission: EM | Admit: 2009-11-21 | Discharge: 2009-11-21 | Payer: Self-pay | Admitting: Emergency Medicine

## 2009-11-26 ENCOUNTER — Emergency Department (HOSPITAL_COMMUNITY)
Admission: EM | Admit: 2009-11-26 | Discharge: 2009-11-26 | Payer: Self-pay | Source: Home / Self Care | Admitting: Emergency Medicine

## 2009-12-02 ENCOUNTER — Ambulatory Visit: Payer: Self-pay | Admitting: Family Medicine

## 2009-12-07 ENCOUNTER — Encounter: Admission: RE | Admit: 2009-12-07 | Discharge: 2009-12-07 | Payer: Self-pay | Admitting: *Deleted

## 2009-12-18 ENCOUNTER — Ambulatory Visit: Payer: Self-pay | Admitting: Family Medicine

## 2009-12-20 LAB — CONVERTED CEMR LAB: Hgb A1c MFr Bld: 7.2 % — ABNORMAL HIGH (ref 4.6–6.5)

## 2009-12-23 ENCOUNTER — Telehealth (INDEPENDENT_AMBULATORY_CARE_PROVIDER_SITE_OTHER): Payer: Self-pay | Admitting: *Deleted

## 2010-01-14 ENCOUNTER — Telehealth (INDEPENDENT_AMBULATORY_CARE_PROVIDER_SITE_OTHER): Payer: Self-pay | Admitting: *Deleted

## 2010-02-10 ENCOUNTER — Ambulatory Visit
Admission: RE | Admit: 2010-02-10 | Discharge: 2010-02-10 | Payer: Self-pay | Source: Home / Self Care | Attending: Family Medicine | Admitting: Family Medicine

## 2010-02-10 ENCOUNTER — Telehealth (INDEPENDENT_AMBULATORY_CARE_PROVIDER_SITE_OTHER): Payer: Self-pay | Admitting: *Deleted

## 2010-02-10 ENCOUNTER — Encounter: Payer: Self-pay | Admitting: Family Medicine

## 2010-02-10 LAB — CONVERTED CEMR LAB
Ketones, urine, test strip: NEGATIVE
Nitrite: POSITIVE
Specific Gravity, Urine: <1.005

## 2010-02-24 ENCOUNTER — Encounter: Payer: Self-pay | Admitting: Internal Medicine

## 2010-03-04 NOTE — Progress Notes (Signed)
Summary: refill--Amlodipine  Phone Note Refill Request Message from:  Patient  Refills Requested: Medication #1:  AMLODIPINE BESYLATE 10 MG TABS take one tablet daily   Last Refilled: 09/03/2009 [facility] - fax [phone]  Initial call taken by: Carol Spring,  November 04, 2009 10:33 AM    Prescriptions: AMLODIPINE BESYLATE 10 MG TABS (AMLODIPINE BESYLATE) take one tablet daily  #30 x 3   Entered by:   Ernestene Mention CMA   Authorized by:   Annye Asa MD   Signed by:   Ernestene Mention CMA on 11/04/2009   Method used:   Electronically to        [facility] (retail)       [address]       [place], [place]  [address]       Ph: [phone]       Fax: [phone]   RxID:   [id number]

## 2010-03-04 NOTE — Assessment & Plan Note (Signed)
Summary: dm fu/kdc   Vital Signs:  Patient profile:   40 year old female Weight:      246 pounds Pulse rate:   76 / minute BP sitting:   120 / 80  (left arm)  Vitals Entered By: Malachi Bonds (Jun 12, 2009 3:26 PM) CC: dm f/u - ringworms used otc meds no relief   History of Present Illness: 40 yo woman here today for  1) DM- reports CBGs vary.  90-150 fasting.  no CP, SOB, HAs, visual changes, edema.  due for A1C.  reports dramatic sxs improvement since starting Gabapentin- foot numbness is much improved.  2) Somnolence- had to cancel pulm b/c son was in the hospital, plans on rescheduling  3) Ring worm- lesion on R arm and abd.  sxs 1st appeared a 'few weeks ago'.  has used OTC meds w/out relief but admits to inconsistent use.  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 One Tablet By Mouth Daily 2)  Amlodipine Besylate 10 Mg Tabs (Amlodipine Besylate) .... Take One Tablet Daily 3)  Crestor 10 Mg Tabs (Rosuvastatin Calcium) .... One Tablet By Mouth Daily 4)  Benazepril Hcl 40 Mg Tabs (Benazepril Hcl) .... Take One Tablet Daily 5)  Clonidine Hcl 0.1 Mg Tabs (Clonidine Hcl) .... Take One Tablet Two Times A Day 6)  Tricor 145 Mg Tabs (Fenofibrate) .... Take One Tablet Daily 7)  Novolog Penfill 100 Unit/ml Soln (Insulin Aspart) .... 50 Units Three Times A Day- Adjust As Directed in Office. 8)  Lantus Solostar 100 Unit/ml Soln (Insulin Glargine) .... 95 Units  Daily 9)  Aspirin 325 Mg Tabs (Aspirin) .... Take One Tablet Daily 10)  Metformin Hcl 1000 Mg Tabs (Metformin Hcl) .... Take 1 Tablet By Mouth Two Times A Day 11)  Gabapentin 300 Mg Caps (Gabapentin) .Marland Kitchen.. 1 Tab By Mouth At Bedtime.  Increase To Two Times A Day After 1 Week.  May Increase To Max of Three Times A Day.  Allergies (verified): No Known Drug Allergies  Past History:  Past Surgical History: Last updated: 06/03/2009 Left shoulder surgery Appendectomy R ovary and tube removed Hysteroscopy D&C and NovaSure ablation.  Social History: Last updated: 06/05/2009 currently separating/divorcing husband- has restraining order custodian at [place] Tobacco Use - No.  Alcohol Use - no  Review of Systems      See HPI  Physical Exam  General:  Obese white female awake, alert, NAD Lungs:  Normal respiratory effort, chest expands symmetrically. Lungs are clear to auscultation, no crackles or wheezes. Heart:  Normal rate and regular rhythm. S1 and S2 normal without gallop, murmur, click, rub or other extra sounds. Abdomen:  soft, NT,ND, +BS Pulses:  +2 carotid, radial, DP, PT Extremities:  no C/C/E Skin:  ring worm on R forearm and abdomen   Impression & Recommendations:  Problem # 1:  DIABETES MELLITUS, TYPE II (ICD-250.00) Assessment Unchanged due for A1C- pt did not bring log or meter.  will make  changes to meds as needed based on A1C. Her updated medication list for this problem includes:    Benazepril Hcl 40 Mg Tabs (Benazepril hcl) .Marland Kitchen... Take one tablet daily    Novolog Penfill 100 Unit/ml Soln (Insulin aspart) .Marland KitchenMarland KitchenMarland KitchenMarland Kitchen 50 units three times a day- adjust as directed in office.    Lantus Solostar 100 Unit/ml Soln (Insulin glargine) .Marland Kitchen... 95 units daily    Aspirin 325 Mg Tabs (Aspirin) .Marland Kitchen... Take one tablet daily    Metformin Hcl 1000 Mg Tabs (Metformin hcl) .Marland Kitchen... Take 1 tablet by mouth two times a day  Orders: Venipuncture [id number]) TLB-A1C / Hgb A1C  (Glycohemoglobin) (83036-A1C)  Problem # 2:  SOMNOLENCE (ICD-780.09) Assessment: Unchanged pt had to cancel pulm appt b/c son was in the hospital.  strongly encouraged pt to reschedule given high suspicion of sleep apnea.  Problem # 3:  RINGWORM (ICD-110.9) Assessment: Unchanged pt w/ classic lesions.  instructed pt on proper use of OTC Lamisil.  if no improvement after 1 month of use will consider systemic tx.  Problem # 4:  NUMBNESS (ICD-782.0) Assessment: Improved sxs much improved on neurontin.  continue meds.  Complete Medication List: 1)  Celexa 40 Mg Tabs (Citalopram hydrobromide) .... One tablet by mouth daily 2)  Amlodipine Besylate 10 Mg Tabs (Amlodipine besylate) .... Take one tablet daily 3)  Crestor 10 Mg Tabs (Rosuvastatin calcium) .... One tablet by mouth daily 4)  Benazepril Hcl 40 Mg Tabs (Benazepril hcl) .... Take one tablet daily 5)  Clonidine Hcl 0.1 Mg Tabs (Clonidine hcl) .... Take one tablet two times a day 6)  Tricor 145 Mg Tabs (Fenofibrate) .... Take one tablet daily 7)  Novolog Penfill 100 Unit/ml Soln (Insulin aspart) .... 50 units three times a day- adjust as directed in office. 8)  Lantus Solostar 100 Unit/ml Soln (Insulin glargine) .... 95 units daily 9)  Aspirin 325 Mg Tabs (Aspirin) .... Take one tablet daily 10)  Metformin Hcl 1000 Mg Tabs (Metformin hcl) .... Take 1 tablet by mouth two times a day 11)  Gabapentin 300 Mg Caps (Gabapentin) .Marland Kitchen.. 1 tab by mouth at bedtime.  increase to two times a day after 1 week.  may increase to max of three times a day.  Patient Instructions: 1)  Please schedule a follow-up appointment in 3 months for your complete physical- do not eat before this appt. 2)  We'll notify you of your lab results and adjust your meds as needed 3)  Please call and reschedule your pulmonary appt 4)  Use the Lamisil two times a day for at least a month on the ringworm 5)  GOOD LUCK WITH YOUR SURGERY

## 2010-03-04 NOTE — Assessment & Plan Note (Signed)
Summary: 2 mth fu/ns/kdc   Vital Signs:  Patient profile:   40 year old female Weight:      251.4 pounds Pulse rate:   66 / minute BP sitting:   114 / 70  (left arm)  Vitals Entered By: Malachi Bonds (March 11, 2009 10:36 AM) CC: 2 month f/u   CC:  2 month f/u.  History of Present Illness: [name] is a 40 year old female who presents today with c/o suprapubic pain.  Pain is sharp to dull in nature, worse with urination.  Started 2 days ago, took tylenol without relief.    Diabetes-  notes that her sugars AM fasting - range 89-200's,  Lunchtime- 150's, Bedtime- 230's.  She has appointment with a nutritionist tomorrow and recently started going to the gym. Patient is using lantus 40units subcutaneously two times a day, novolog 50 units three times a day, and metformin.  Declines referral to endocrinology.    Morbid obesity-  She is undergoing evaluation for gastric bypass.    Allergies: No Known Drug Allergies  Review of Systems       +frequency, + dysuria.    Physical Exam  General:  Obese white female awake, alert, NAD Lungs:  Normal respiratory effort, chest expands symmetrically. Lungs are clear to auscultation, no crackles or wheezes. Heart:  Normal rate and regular rhythm. S1 and S2 normal without gallop, murmur, click, rub or other extra sounds. Extremities:  no foot lesions or swelling   Impression & Recommendations:  Problem # 1:  SOMNOLENCE (ICD-780.09) Assessment Unchanged  Tells me that no-one contacted her about her sleep study- will reorder.  I am still highly suspicious that this is her primary problem.  Orders: Sleep Disorder Referral (Sleep Disorder)  Problem # 2:  DIABETES MELLITUS, TYPE II (ICD-250.00) Assessment: Deteriorated  Declines podiatry referral, she will make appointment for eye exam.  Will increase lantus to 45 units Subcutaneously two times a day from 40 units Subcutaneously two times a day. I also offered referral to endocrinology  which she refuses.  Her updated medication list for this problem includes:    Benazepril Hcl 40 Mg Tabs (Benazepril hcl) .Marland Kitchen... Take one tablet daily    Novolog Penfill 100 Unit/ml Soln (Insulin aspart) .Marland KitchenMarland KitchenMarland KitchenMarland Kitchen 50 units three times a day- adjust as directed in office.    Lantus Solostar 100 Unit/ml Soln (Insulin glargine) .Marland Kitchen... 95 units daily    Aspirin 325 Mg Tabs (Aspirin) .Marland Kitchen... Take one tablet daily    Metformin Hcl 1000 Mg Tabs (Metformin hcl) .Marland Kitchen... Take 1 tablet by mouth two times a day  Orders: Venipuncture [id number]) TLB-BMP (Basic Metabolic Panel-BMET) (99991111) TLB-A1C / Hgb A1C (Glycohemoglobin) (83036-A1C) TLB-Microalbumin/Creat Ratio, Urine (82043-MALB)  Problem # 3:  HYPERTENSION (ICD-401.9) Assessment: Improved  Her updated medication list for this problem includes:    Amlodipine Besylate 10 Mg Tabs (Amlodipine besylate) .Marland Kitchen... Take one tablet daily    Benazepril Hcl 40 Mg Tabs (Benazepril hcl) .Marland Kitchen... Take one tablet daily    Clonidine Hcl 0.1 Mg Tabs (Clonidine hcl) .Marland Kitchen... Take one tablet two times a day  BP today: 114/70 Prior BP: 124/72 (12/05/2008)  Labs Reviewed: K+: 4.2 (12/05/2008) Creat: : 1.0 (12/05/2008)   Chol: 174 (04/02/2008)   HDL: 33 (04/02/2008)   LDL: 110 (04/02/2008)   TG: 155 (04/02/2008)  Problem # 4:  ANXIETY (ICD-300.00) Assessment: Improved Notes that her anxiety is improved since celexa was increased to 40mg .   The following medications were removed from the  medication list:    Lorazepam 0.5 Mg Tabs (Lorazepam) .Marland Kitchen... Take one tablet as needed Her updated medication list for this problem includes:    Celexa 40 Mg Tabs (Citalopram hydrobromide) ..... One tablet by mouth daily  Problem # 5:  DYSURIA (ICD-788.1)  Orders: Specimen Handling (99000) T-Culture, Urine [id number]) UA Dipstick w/o Micro (manual) (81002)  Complete Medication List: 1)  Celexa 40 Mg Tabs (Citalopram hydrobromide) .... One tablet by mouth daily 2)  Amlodipine  Besylate 10 Mg Tabs (Amlodipine besylate) .... Take one tablet daily 3)  Crestor 20 Mg Tabs (Rosuvastatin calcium) .... Take one tablet at bedtime 4)  Benazepril Hcl 40 Mg Tabs (Benazepril hcl) .... Take one tablet daily 5)  Clonidine Hcl 0.1 Mg Tabs (Clonidine hcl) .... Take one tablet two times a day 6)  Tricor 145 Mg Tabs (Fenofibrate) .... Take one tablet daily 7)  Novolog Penfill 100 Unit/ml Soln (Insulin aspart) .... 50 units three times a day- adjust as directed in office. 8)  Lantus Solostar 100 Unit/ml Soln (Insulin glargine) .... 95 units daily 9)  Naprosyn 500 Mg Tabs (Naproxen) .Marland Kitchen.. 1 two times a day as needed for pain.  take w/ food 10)  Aspirin 325 Mg Tabs (Aspirin) .... Take one tablet daily 11)  Metformin Hcl 1000 Mg Tabs (Metformin hcl) .... Take 1 tablet by mouth two times a day 12)  Gabapentin 300 Mg Caps (Gabapentin) .Marland Kitchen.. 1 tab by mouth at bedtime.  increase to two times a day after 1 week.  may increase to max of three times a day. 13)  Triamcinolone Acetonide 0.1 % Oint (Triamcinolone acetonide) .... Apply in thin layer two times a day to affected area.  disp 1 large tube  Other Orders: TLB-Lipid Panel (80061-LIPID) TLB-Hepatic/Liver Function Pnl (80076-HEPATIC)  Patient Instructions: 1)  Please increase your Lantus to 45 units Subcutaneously twice daily.   Call if sugars are over 250 or less than 80 on this regimen.   2)  Keep your appointment with nutrition 3)  You will be contacted about your appointment for the sleep study- call us if you have not heard back in 1 week. 4)  Please follow up after your sleep study, and again in 3 months for diabetes follow up.  Laboratory Results   Urine Tests    Routine Urinalysis   Color: yellow Appearance: Clear Glucose: negative   (Normal Range: Negative) Bilirubin: negative   (Normal Range: Negative) Ketone: negative   (Normal Range: Negative) Spec. Gravity: 1.015   (Normal Range: 1.003-1.035) Blood: negative   (Normal  Range: Negative) pH: 6.0   (Normal Range: 5.0-8.0) Protein: negative   (Normal Range: Negative) Urobilinogen: 0.2   (Normal Range: 0-1) Nitrite: negative   (Normal Range: Negative) Leukocyte Esterace: negative   (Normal Range: Negative)       Laboratory Results   Urine Tests    Routine Urinalysis   Color: yellow Appearance: Clear Glucose: negative   (Normal Range: Negative) Bilirubin: negative   (Normal Range: Negative) Ketone: negative   (Normal Range: Negative) Spec. Gravity: 1.015   (Normal Range: 1.003-1.035) Blood: negative   (Normal Range: Negative) pH: 6.0   (Normal Range: 5.0-8.0) Protein: negative   (Normal Range: Negative) Urobilinogen: 0.2   (Normal Range: 0-1) Nitrite: negative   (Normal Range: Negative) Leukocyte Esterace: negative   (Normal Range: Negative)

## 2010-03-04 NOTE — Progress Notes (Signed)
Summary: crestor refill  Phone Note Refill Request Message from:  Fax from Pharmacy on April 01, 2009 11:05 AM  Refills Requested: Medication #1:  CRESTOR 10 MG TABS one tablet by mouth daily [facility] [address] fax [phone]   Method Requested: Fax to Local Pharmacy Next Appointment Scheduled: no appt Initial call taken by: Silva Bandy,  April 01, 2009 11:06 AM    Prescriptions: CRESTOR 10 MG TABS (ROSUVASTATIN CALCIUM) one tablet by mouth daily  #30 x 3   Entered by:   Malachi Bonds   Authorized by:   Annye Asa MD   Signed by:   Malachi Bonds on 04/02/2009   Method used:   Electronically to        [facility] (retail)       [address]       [place], [place]  [address]       Ph: [phone]       Fax: [phone]   RxID:   [id number]

## 2010-03-04 NOTE — Progress Notes (Signed)
Summary: gabapentin refill   Phone Note Refill Request Message from:  Pharmacy on [facility] [address] Joylene Igo #: [phone]  Refills Requested: Medication #1:  GABAPENTIN 300 MG CAPS 1 tab by mouth at bedtime.  increase to two times a day after 1 week.  may increase to max of three times a day.   Dosage confirmed as above?Dosage Confirmed   Supply Requested: 3 months Initial call taken by: Elna Breslow,  March 19, 2009 8:44 AM    Prescriptions: GABAPENTIN 300 MG CAPS (GABAPENTIN) 1 tab by mouth at bedtime.  increase to two times a day after 1 week.  may increase to max of three times a day.  #90 x 2   Entered by:   Malachi Bonds   Authorized by:   Annye Asa MD   Signed by:   Malachi Bonds on 03/19/2009   Method used:   Electronically to        [facility] (retail)       [address]       [place], [place]  [address]       Ph: [phone]       Fax: [phone]   RxID:   [id number]

## 2010-03-04 NOTE — Letter (Signed)
Summary: Blood Sugar Reading from Patient   Blood Sugar Reading from Patient   Imported By: Rise Patience 08/30/2009 10:17:03  _____________________________________________________________________  External Attachment:    Type:   Image     Comment:   External Document

## 2010-03-04 NOTE — Consult Note (Signed)
Summary: Lovey Newcomer MD Dermatology  Lovey Newcomer MD Dermatology   Imported By: Edmonia James 10/23/2009 14:21:05  _____________________________________________________________________  External Attachment:    Type:   Image     Comment:   External Document

## 2010-03-04 NOTE — Progress Notes (Signed)
Summary: note from pharmacy re celexa  Phone Note Refill Request   Refills Requested: Medication #1:  CELEXA 40 MG TABS 1 1/2 tablet by mouth daily [facility] - fax [phone] no phone # listed - note from pharmacy -  need new script FDA does not allow dose greater than 40 mg a day    Initial call taken by: Arbie Coo[place],  December 23, 2009 4:49 PM  Follow-up for Phone Call        spoke w/ pharmacist says just within the last month FDA come up w/ this warning due to heart arrthymia's so no longer can prescribe a dose greater than 40mg  ........Marland KitchenMalachi Bonds CMA  December 24, 2009 3:14 PM     New/Updated Medications: CELEXA 40 MG TABS (CITALOPRAM HYDROBROMIDE) take one tablet daily Prescriptions: CELEXA 40 MG TABS (CITALOPRAM HYDROBROMIDE) take one tablet daily  #30 x 3   Entered by:   Malachi Bonds CMA   Authorized by:   Annye Asa MD   Signed by:   Malachi Bonds CMA on 12/24/2009   Method used:   Electronically to        [facility] (retail)       [address]       [place], [place]  [address]       Ph: [phone]       Fax: [phone]   RxID:   [id number]

## 2010-03-04 NOTE — Assessment & Plan Note (Signed)
Summary: 3 month followup/kn   Vital Signs:  Patient profile:   40 year old female Weight:      198 pounds Pulse rate:   68 / minute BP sitting:   120 / 72  (left arm)  Vitals Entered By: Malachi Bonds CMA (December 18, 2009 3:47 PM) CC: diabetes f/u    History of Present Illness: 40 yo woman here today for  1) DM- s/p epidural injxn that elevated sugars.  otherwise running 60s-100.  had 3 episodes of lows in the 21-30s.  this was while taking the SSI.  taking Metformin daily.  not taking Lantus.  only using SSI when sugars are elevated.  2) HTN- excellent control on current meds.  no CP, SOB, visual changes, edema.  3) Depression- current stress related to home situation.  new place isn't ready til mid Dec, staying w/ parents, had large fight w/ mom and is now bouncing back and forth between sisters.  would like to increase Celexa.  4) Hyperlipidemia- stopped Crestor.  taking Tricor.  would like to switch to generic fenofibrate  Current Medications (verified): 1)  Celexa 40 Mg Tabs (Citalopram Hydrobromide) .... One Tablet By Mouth Daily 2)  Amlodipine Besylate 10 Mg Tabs (Amlodipine Besylate) .... Take One Tablet Daily 3)  Benazepril Hcl 40 Mg Tabs (Benazepril Hcl) .... Take One Tablet Daily 4)  Clonidine Hcl 0.1 Mg Tabs (Clonidine Hcl) .... Take One Tablet Two Times A Day 5)  Tricor 145 Mg Tabs (Fenofibrate) .... Take One Tablet Daily 6)  Novolog Penfill 100 Unit/ml Soln (Insulin Aspart) .... 50 Units Three Times A Day- Adjust As Directed in Office. 7)  Lantus Solostar 100 Unit/ml Soln (Insulin Glargine) .... 95 Units Daily 8)  Metformin Hcl 1000 Mg Tabs (Metformin Hcl) .... Take 1 Tablet By Mouth Two Times A Day 9)  Onetouch Ultra Test  Strp (Glucose Blood) .... Test Up To 6 Times Daily  Allergies (verified): No Known Drug Allergies  Past History:  Past medical, surgical, family and social histories (including risk factors) reviewed, and no changes noted (except as noted  below).  Past Medical History: Reviewed history from 06/05/2009 and no changes required. CVA (ICD-434.91) HYPERTENSION (ICD-401.9) HYPERLIPIDEMIA (ICD-272.4) OBESITY (ICD-278.00) DEPRESSION (ICD-311) ANXIETY (ICD-300.00) DIABETES MELLITUS, TYPE II (ICD-250.00)  Past Surgical History: Reviewed history from 09/16/2009 and no changes required. Left shoulder surgery Appendectomy R ovary and tube removed Hysteroscopy D&C and NovaSure ablation. gastric bypass 8/11  Family History: Reviewed history from 05/01/2008 and no changes required. CAD-father HTN-father,siblings,mother DM-father STROKE-father COLON CA-father BREAST CA-no  Social History: Reviewed history from 06/05/2009 and no changes required. currently separating/divorcing husband- has restraining order custodian at [place] Tobacco Use - No.  Alcohol Use - no  Review of Systems      See HPI  Physical Exam  General:  Well-developed,well-nourished,in no acute distress; alert,appropriate and cooperative throughout examination Neck:  very thick/short neck, but supple Lungs:  Normal respiratory effort, chest expands symmetrically. Lungs are clear to auscultation, no crackles or wheezes. Heart:  Normal rate and regular rhythm. S1 and S2 normal without gallop, murmur, click, rub or other extra sounds. Abdomen:  soft, NT/ND, +BS Pulses:  +2 carotid, radial, DP/PT Extremities:  no C/C/E Psych:  more engaged, good eye contact   Impression & Recommendations:  Problem # 1:  DIABETES MELLITUS, TYPE II (ICD-250.00) Assessment Unchanged given pt's ongoing weight loss and symptomatic and severe lows will stop Lantus and only use SSI when sugars are extremely elevated.  check A1C which may be falsely elevated due to steroids and epidural infxn.  will follow closely. The following medications were removed from the medication list:    Aspirin 325 Mg Tabs (Aspirin) .Marland Kitchen... Take one tablet daily Her updated  medication list for this problem includes:    Benazepril Hcl 40 Mg Tabs (Benazepril hcl) .Marland Kitchen... Take one tablet daily    Novolog Penfill 100 Unit/ml Soln (Insulin aspart) .Marland KitchenMarland KitchenMarland KitchenMarland Kitchen 50 units three times a day- adjust as directed in office.    Lantus Solostar 100 Unit/ml Soln (Insulin glargine) .Marland Kitchen... 95 units daily    Metformin Hcl 1000 Mg Tabs (Metformin hcl) .Marland Kitchen... Take 1 tablet by mouth two times a day  Orders: Venipuncture [id number]) TLB-A1C / Hgb A1C (Glycohemoglobin) (83036-A1C) Specimen Handling (99000)  Problem # 2:  HYPERTENSION (ICD-401.9) Assessment: Unchanged well controlled.  asymptomatic. Her updated medication list for this problem includes:    Amlodipine Besylate 10 Mg Tabs (Amlodipine besylate) .Marland Kitchen... Take one tablet daily    Benazepril Hcl 40 Mg Tabs (Benazepril hcl) .Marland Kitchen... Take one tablet daily    Clonidine Hcl 0.1 Mg Tabs (Clonidine hcl) .Marland Kitchen... Take one tablet two times a day  Problem # 3:  DEPRESSION (ICD-311) Assessment: Unchanged having difficult time w/ living situation.  increase Celexa to 60 mg (max dose) Her updated medication list for this problem includes:    Celexa 40 Mg Tabs (Citalopram hydrobromide) .Marland Kitchen... 1 1/2 tablet by mouth daily  Problem # 4:  HYPERLIPIDEMIA (ICD-272.4) Assessment: Unchanged switch to generic fenofibrate.  recheck cholesterol at next visit. The following medications were removed from the medication list:    Crestor 10 Mg Tabs (Rosuvastatin calcium) ..... One tablet by mouth daily Her updated medication list for this problem includes:    Fenofibrate 160 Mg Tabs (Fenofibrate) .Marland Kitchen... 1 tab daily  Complete Medication List: 1)  Celexa 40 Mg Tabs (Citalopram hydrobromide) .Marland Kitchen.. 1 1/2 tablet by mouth daily 2)  Amlodipine Besylate 10 Mg Tabs (Amlodipine besylate) .... Take one tablet daily 3)  Benazepril Hcl 40 Mg Tabs (Benazepril hcl) .... Take one tablet daily 4)  Clonidine Hcl 0.1 Mg Tabs (Clonidine hcl) .... Take one tablet two times a day 5)   Fenofibrate 160 Mg Tabs (Fenofibrate) .Marland Kitchen.. 1 tab daily 6)  Novolog Penfill 100 Unit/ml Soln (Insulin aspart) .... 50 units three times a day- adjust as directed in office. 7)  Lantus Solostar 100 Unit/ml Soln (Insulin glargine) .... 95 units daily 8)  Metformin Hcl 1000 Mg Tabs (Metformin hcl) .... Take 1 tablet by mouth two times a day 9)  Onetouch Ultra Test Strp (Glucose blood) .... Test up to 6 times daily  Patient Instructions: 1)  Follow up in 3 months for diabetes and cholesterol- do not eat before this appt 2)  Don't worry about the Vit D- we'll recheck this in Feb 3)  We'll notify you of your A1C- don't be surprised if it's high b/c of the steroids 4)  Make sure you're eating regularly- avoid the insulin unless your sugar is high 5)  Increase your Celexa to 1 1/2 tab daily 6)  The fenofibrate is in place of the Tricor 7)  Hang in there!! Prescriptions: CELEXA 40 MG TABS (CITALOPRAM HYDROBROMIDE) 1 1/2 tablet by mouth daily  #45 x 11   Entered and Authorized by:   Annye Asa MD   Signed by:   Annye Asa MD on 12/18/2009   Method used:   [facility]       [address]       [place], [place]  [address]       Ph: [phone]       Fax: [phone]   RxID:   [id number] FENOFIBRATE 160 MG TABS (FENOFIBRATE) 1 tab daily  #30 x 6   Entered and Authorized by:   Annye Asa MD   Signed by:   Annye Asa MD on 12/18/2009   Method used:   Electronically to        [facility] (retail)       [address]       [place], [place]  [address]       Ph: [phone]       Fax: [phone]   RxID:   [id number]    Orders Added: 1)  Venipuncture [id number] 2)  TLB-A1C / Hgb A1C (Glycohemoglobin) [83036-A1C] 3)  Specimen Handling [99000] 4)  Est. Patient Level IV [id number]

## 2010-03-04 NOTE — Progress Notes (Signed)
Summary: low BS  Phone Note Call from Patient   Caller: Patient Summary of Call: Patient calling says she has started liquid diet now she is having trouble w/ BS staying up. Wants to know what she should do about taking metformin has currently stopped insulin due to low reading between 50's-60's needs helping regulating. Was 46 this morning. Informed patient that she needs to stop both insulin's and metformin and callback week blood sugars weekly to see how they are running and we can make adjustments if needed.  Initial call taken by: Malachi Bonds CMA,  August 15, 2009 2:57 PM  Follow-up for Phone Call        agree.  if pt not eating needs to stop all diabetic medication. Follow-up by: Annye Asa MD,  August 15, 2009 3:01 PM

## 2010-03-04 NOTE — Letter (Signed)
Summary: [facility] General Surgery  [facility] General Surgery   Imported By: Edmonia James 09/17/2009 09:55:07  _____________________________________________________________________  External Attachment:    Type:   Image     Comment:   External Document

## 2010-03-04 NOTE — Progress Notes (Signed)
Summary: benazepril refill   Phone Note Refill Request   Refills Requested: Medication #1:  BENAZEPRIL HCL 40 MG TABS take one tablet daily    Prescriptions: BENAZEPRIL HCL 40 MG TABS (BENAZEPRIL HCL) take one tablet daily  #30 x 6   Entered by:   Malachi Bonds CMA   Authorized by:   Annye Asa MD   Signed by:   Malachi Bonds CMA on 09/20/2009   Method used:   Electronically to        [facility] (retail)       [address]       [place], [place]  [address]       Ph: [phone]       Fax: [phone]   RxID:   [id number]

## 2010-03-04 NOTE — Assessment & Plan Note (Signed)
Summary: SUR/GASTRIC BYPASS/DM    Primary Provider:  Annye Asa MD   History of Present Illness: The patient is a  female who I saw in the past for a nonischemic cardiomyopathy. Note, the patient had a nuclear study on August 08, 2003 for chest pain. At that time, her ejection fraction was 28%.  There was no scar or ischemia.  She did have a followup echocardiogram on August 08, 2003, and was felt to be technically difficult.  It was felt that there was significant wall motion abnormalities and ejection fraction was not given.  The patient, therefore ultimately underwent cardiac catheterization on August 09, 2003.  At that time, she was found to have mild plaque in the LAD, but there was no obstructive disease noted.  The ejection fraction cannot be evaluated due to ventricular ectopy.  She, therefore ultimately underwent a MUGA, which showed an ejection fraction of 51%.  Myoview in July of 2009 showed ejection fraction of 31% but the overall LV function was felt to be normal (LV function appeared better than calculated EF). There was no ischemia or infarction. Transesophageal echocardiogram in the setting of a CVA in May 2010  showed normal LV function. Patient is scheduled to have gastric bypass surgery and we were asked to evaluate preoperatively. She can have some dyspnea on exertion relieved with rest. There is no associated chest pain. There is no orthopnea or PND but has occasional mild pedal edema. She does not have exertional chest pain, claudication, palpitations or syncope.  Preventive Screening-Counseling & Management  Alcohol-Tobacco     Smoking Status: never  Current Medications (verified): 1)  Celexa 40 Mg Tabs (Citalopram Hydrobromide) .... One Tablet By Mouth Daily 2)  Amlodipine Besylate 10 Mg Tabs (Amlodipine Besylate) .... Take One Tablet Daily 3)  Crestor 10 Mg Tabs (Rosuvastatin Calcium) .... One Tablet By Mouth Daily 4)  Benazepril Hcl 40 Mg Tabs (Benazepril Hcl) .... Take One  Tablet Daily 5)  Clonidine Hcl 0.1 Mg Tabs (Clonidine Hcl) .... Take One Tablet Two Times A Day 6)  Tricor 145 Mg Tabs (Fenofibrate) .... Take One Tablet Daily 7)  Novolog Penfill 100 Unit/ml Soln (Insulin Aspart) .... 50 Units Three Times A Day- Adjust As Directed in Office. 8)  Lantus Solostar 100 Unit/ml Soln (Insulin Glargine) .... 95 Units Daily 9)  Aspirin 325 Mg Tabs (Aspirin) .... Take One Tablet Daily 10)  Metformin Hcl 1000 Mg Tabs (Metformin Hcl) .... Take 1 Tablet By Mouth Two Times A Day 11)  Gabapentin 300 Mg Caps (Gabapentin) .Marland Kitchen.. 1 Tab By Mouth At Bedtime.  Increase To Two Times A Day After 1 Week.  May Increase To Max of Three Times A Day.  Allergies: No Known Drug Allergies  Past History:  Past Medical History: CVA (ICD-434.91) HYPERTENSION (ICD-401.9) HYPERLIPIDEMIA (ICD-272.4) OBESITY (ICD-278.00) DEPRESSION (ICD-311) ANXIETY (ICD-300.00) DIABETES MELLITUS, TYPE II (ICD-250.00)  Past Surgical History: Reviewed history from 06/03/2009 and no changes required. Left shoulder surgery Appendectomy R ovary and tube removed Hysteroscopy D&C and NovaSure ablation.  Social History: Reviewed history from 05/01/2008 and no changes required. currently separating/divorcing husband- has restraining order custodian at [place] Tobacco Use - No.  Alcohol Use - no  Review of Systems       no fevers or chills, productive cough, hemoptysis, dysphasia, odynophagia, melena, hematochezia, dysuria, hematuria, rash, seizure activity, orthopnea, PND,  claudication. Remaining systems are negative.   Vital Signs:  Patient profile:   40 year old female Height:  64 inches Weight:      246 pounds BMI:     42.38 Pulse rate:   77 / minute Pulse rhythm:   regular Resp:     14 per minute BP sitting:   105 / 68  (left arm)  Vitals Entered By: Burnett Kanaris (Jun 05, 2009 4:09 PM)  Physical Exam  General:  Well-developed obese in no acute distress.  Skin  is warm and dry.  HEENT is normal.  Neck is supple. No thyromegaly.  Chest is clear to auscultation with normal expansion.  Cardiovascular exam is regular rate and rhythm.  Abdominal exam nontender or distended. No masses palpated. Extremities show no edema. neuro grossly intact    EKG  Procedure date:  06/05/2009  Findings:      Normal sinus rhythm at a rate of 77. Axis normal. Nonspecific ST changes.  Impression & Recommendations:  Problem # 1:  PREOPERATIVE EXAMINATION (ICD-V72.84) Patient is scheduled for preoperative evaluation. A previous catheterization showed nonobstructive coronary disease. A Myoview less than 2 years ago showed no ischemia. Her LV function is normal on recent transesophageal echocardiogram. I therefore think she can proceed with her surgery without further cardiac evaluation. I think her overall risk would be low.  Problem # 2:  HYPERTENSION (ICD-401.9) Blood pressure controlled on present medications. Will continue. Renal function monitored by her primary care. Her updated medication list for this problem includes:    Amlodipine Besylate 10 Mg Tabs (Amlodipine besylate) .Marland Kitchen... Take one tablet daily    Benazepril Hcl 40 Mg Tabs (Benazepril hcl) .Marland Kitchen... Take one tablet daily    Clonidine Hcl 0.1 Mg Tabs (Clonidine hcl) .Marland Kitchen... Take one tablet two times a day    Aspirin 325 Mg Tabs (Aspirin) .Marland Kitchen... Take one tablet daily  Problem # 3:  HYPERLIPIDEMIA (ICD-272.4) Continue present medications. Lipids and liver monitored by primary care. Her updated medication list for this problem includes:    Crestor 10 Mg Tabs (Rosuvastatin calcium) ..... One tablet by mouth daily    Tricor 145 Mg Tabs (Fenofibrate) .Marland Kitchen... Take one tablet daily  Problem # 4:  CVA (ICD-434.91)  Her updated medication list for this problem includes:    Aspirin 325 Mg Tabs (Aspirin) .Marland Kitchen... Take one tablet daily  Problem # 5:  DIABETES MELLITUS, TYPE II (ICD-250.00) Management per primary  care. Her updated medication list for this problem includes:    Benazepril Hcl 40 Mg Tabs (Benazepril hcl) .Marland Kitchen... Take one tablet daily    Novolog Penfill 100 Unit/ml Soln (Insulin aspart) .Marland KitchenMarland KitchenMarland KitchenMarland Kitchen 50 units three times a day- adjust as directed in office.    Lantus Solostar 100 Unit/ml Soln (Insulin glargine) .Marland Kitchen... 95 units daily    Aspirin 325 Mg Tabs (Aspirin) .Marland Kitchen... Take one tablet daily    Metformin Hcl 1000 Mg Tabs (Metformin hcl) .Marland Kitchen... Take 1 tablet by mouth two times a day  Problem # 6:  OBESITY (ICD-278.00)

## 2010-03-04 NOTE — Progress Notes (Signed)
Summary: discuss labs-lmom  Phone Note Outgoing Call   Action Taken: Assistance medications ready for pick up Summary of Call: pls call patient and let her know that she can cut her cholesterol medicine in half and take crestor 10 mg by mouth daily.  A1C is up from 7.5 to 8.2.  She should make the changes to her insulin that we talked about and follow up in 6 weeks for diabetes. Sooner if problems or concerns Initial call taken by: Nance Pear FNP,  March 11, 2009 5:55 PM  Follow-up for Phone Call        left message on machine [phone] patient cell ..........Marland KitchenMalachi Bonds  March 12, 2009 12:07 PM   Additional Follow-up for Phone Call Additional follow up Details #1::        PT AWARE....................Marland KitchenFelecia Deloach CMA  March 12, 2009 12:13 PM     New/Updated Medications: CRESTOR 10 MG TABS (ROSUVASTATIN CALCIUM) one tablet by mouth daily

## 2010-03-04 NOTE — Medication Information (Signed)
Summary: [facility]  [facility]   Imported By: Phillis Knack 10/23/2009 11:49:38  _____________________________________________________________________  External Attachment:    Type:   Image     Comment:   External Document

## 2010-03-04 NOTE — Progress Notes (Signed)
Summary: clonidine refill   Phone Note Refill Request Call back at [phone] Message from:  Pharmacy on July 22, 2009 9:56 AM  Refills Requested: Medication #1:  CLONIDINE HCL 0.1 MG TABS take one tablet two times a day   Dosage confirmed as above?Dosage Confirmed   Supply Requested: 1 month   Last Refilled: 06/25/2009 [facility] [address]  Next Appointment Scheduled: Auguest 15th 2011 Initial call taken by: Osborn Coho,  July 22, 2009 9:57 AM    Prescriptions: CLONIDINE HCL 0.1 MG TABS (CLONIDINE HCL) take one tablet two times a day  #60 x 6   Entered by:   Malachi Bonds   Authorized by:   Annye Asa MD   Signed by:   Malachi Bonds on 07/22/2009   Method used:   Electronically to        [facility] (retail)       [address]       [place], [place]  [address]       Ph: [phone]       Fax: [phone]   RxID:   [id number]

## 2010-03-04 NOTE — Progress Notes (Signed)
Summary: labs   Phone Note Call from Patient   Caller: Patient-msg on voicemail  Summary of Call: patient called back says during inital phone call she was unable to talk b/c she was around others but says she hasn't been taking medication regularly b/c she didn't have money for copay and during time son was in hospital she didn't have also would like to have another chance to get diabetes under control before being referred to endocrinology says she will do better by her next 3 month f/u.  Initial call taken by: Malachi Bonds,  Jun 14, 2009 3:35 PM  Follow-up for Phone Call        ok...we can give her 3 more months to try and get things together.  if we have samples of lantus or novolog please call her to pick these ups.  will hold on endo referral at this time Follow-up by: Annye Asa MD,  Jun 15, 2009 1:41 PM  Additional Follow-up for Phone Call Additional follow up Details #1::        spoke w/ patient aware of above information and that samples to be given at time of pick up .Marland KitchenMarland KitchenMarland KitchenMalachi Bonds  Jun 17, 2009 9:13 AM

## 2010-03-04 NOTE — Miscellaneous (Signed)
Summary: Vitamin D RX  Medications Added VITAMIN D (ERGOCALCIFEROL) 50000 UNIT CAPS (ERGOCALCIFEROL) 1 by mouth q week for 12 weeks       Clinical Lists Changes  Medications: Added new medication of VITAMIN D (ERGOCALCIFEROL) 50000 UNIT CAPS (ERGOCALCIFEROL) 1 by mouth q week for 12 weeks - Signed Rx of VITAMIN D (ERGOCALCIFEROL) 50000 UNIT CAPS (ERGOCALCIFEROL) 1 by mouth q week for 12 weeks;  #4 x 3;  Signed;  Entered by: Ernestene Mention CMA;  Authorized by: Annye Asa MD;  Method used: Electronically to [facility] [address] [place], [place], [place]  [address], Ph: [phone], Fax: [phone]    Prescriptions: VITAMIN D (ERGOCALCIFEROL) 50000 UNIT CAPS (ERGOCALCIFEROL) 1 by mouth q week for 12 weeks  #4 x 3   Entered by:   Ernestene Mention CMA   Authorized by:   Annye Asa MD   Signed by:   Ernestene Mention CMA on 09/17/2009   Method used:   Electronically to        [facility] (retail)       [address]       [place], [place]  [address]       Ph: [phone]       Fax: [phone]   RxID:   [id number]     Allergies: No Known Drug Allergies

## 2010-03-04 NOTE — Progress Notes (Signed)
Summary: gabapentin refill   Phone Note Refill Request Call back at [phone] Message from:  Pharmacy on July 15, 2009 9:58 AM  Refills Requested: Medication #1:  GABAPENTIN 300 MG CAPS 1 tab by mouth at bedtime.  increase to two times a day after 1 week.  may increase to max of three times a day..   Dosage confirmed as above?Dosage Confirmed   Supply Requested: 1 month [place]  Next Appointment Scheduled: Aug. 15th Initial call taken by: Elna Breslow,  July 15, 2009 10:02 AM    Prescriptions: GABAPENTIN 300 MG CAPS (GABAPENTIN) 1 tab by mouth at bedtime.  increase to two times a day after 1 week.  may increase to max of three times a day.  #90 x 2   Entered by:   Malachi Bonds   Authorized by:   Annye Asa MD   Signed by:   Malachi Bonds on 07/15/2009   Method used:   Electronically to        [facility] (retail)       [address]       [place], [place]  [address]       Ph: [phone]       Fax: [phone]   RxID:   [id number]

## 2010-03-04 NOTE — Progress Notes (Signed)
Summary: faxed surgery clearance   Phone Note From Other Clinic   Caller: tina from [place] office [phone] fax [phone] Request: Talk with Nurse Summary of Call: surgery is pending .  Initial call taken by: Neil Crouch,  Jun 20, 2009 2:36 PM  Follow-up for Phone Call        office note faxed Fredia Beets, RN  Jun 20, 2009 4:04 PM

## 2010-03-04 NOTE — Letter (Signed)
Summary: [facility]  [facility] Instructions/[facility]   Imported By: Edmonia James 09/19/2009 10:38:04  _____________________________________________________________________  External Attachment:    Type:   Image     Comment:   External Document

## 2010-03-04 NOTE — Progress Notes (Signed)
Summary: ?celexa  Phone Note From Other Clinic Call back at [phone]   Caller: Dr. Willette Alma (psychologist in [place]) Summary of Call: Patient is seeing Dr. Tamala Julian for psych clearance for wt loss surgery.  Patient is still depressed.  She is currently on Celexa 20mg .  She is not sleeping well.  Has difficulty going to sleep & then wakes up numerous times.  Dr. Tamala Julian feels like Celexa needs to be increased.  I advised Dr. Tamala Julian that I would discuss with Melissa & we would call the patient back tomorrow. Melissa, will you increase celexa & then have pt schedule f/u ov with you 3 weeks after increasing celexa?  Anything for sleep? She has lorazepam @ home to use as needed. Initial call taken by: Dawson Bills,  February 18, 2009 2:21 PM  Follow-up for Phone Call        Please call patient and let her know that I have increased her celexa to 40mg .  She should follow up with me in 3 weeks.  Advise patient that should she develop suicidal thoughts she is to go directly to ED.   Follow-up by: Nance Pear FNP,  February 18, 2009 5:07 PM  Additional Follow-up for Phone Call Additional follow up Details #1::        left message on machine for Dr. Willette Alma.Marland KitchenMarland KitchenMalachi Bonds  February 18, 2009 5:11 PM   left detailed msg on machine for patient of instructions but ask patient to call in the morning........Marland KitchenMalachi Bonds  February 18, 2009 5:14 PM     Additional Follow-up for Phone Call Additional follow up Details #2::    spoke w/ patient aware of recommendations and has appt scheduled to f/u .........Marland KitchenMalachi Bonds  February 19, 2009 9:31 AM   New/Updated Medications: CELEXA 40 MG TABS (CITALOPRAM HYDROBROMIDE) one tablet by mouth daily Prescriptions: CELEXA 40 MG TABS (CITALOPRAM HYDROBROMIDE) one tablet by mouth daily  #30 x 1   Entered and Authorized by:   Nance Pear FNP   Signed by:   Nance Pear FNP on 02/18/2009   Method used:   Electronically to        [facility] (retail)       [address]       [place], [place]  [address]       Ph: [phone]       Fax: [phone]   RxID:   [id number]

## 2010-03-04 NOTE — Letter (Signed)
Summary: Primary Care Consult Scheduled Letter  [facility]   [place], [place] [address]   Phone: [phone]  Fax: [phone]      03/12/2009 MRN: [id number]  [facility] [address] [place], [place]  [address]    Dear Ms. MASTIN,    We have scheduled an appointment for you.  At the recommendation of Debbrah Alar, FNP, we have scheduled you a consult with Dr. Danton Sewer with [facility] on 03-21-2009 at 11:30am.  Their address is [address]nd floor, [place] [address]. The office phone number is [phone].  If this appointment day and time is not convenient for you, please feel free to call the office of the doctor you are being referred to at the number listed above and reschedule the appointment.    It is important for you to keep your scheduled appointments. We are here to make sure you are given good patient care.   Thank you,    Renee, Patient Care Coordinator [facility]    **IF YOU ARE UNABLE TO KEEP THIS APPOINTMENT, OR NEED TO RESCHEDULE, PLEASE GIVE DR. CLANCE'S OFFICE A 24 HOUR NOTICE TO AVOID A $50 FEE**

## 2010-03-04 NOTE — Progress Notes (Signed)
Summary: amlodipine refill   Phone Note Refill Request Call back at [phone] Message from:  Pharmacy on July 04, 2009 2:51 PM  Refills Requested: Medication #1:  AMLODIPINE BESYLATE 10 MG TABS take one tablet daily   Dosage confirmed as above?Dosage Confirmed   Supply Requested: 1 month   Last Refilled: 06/04/2009 [facility] on [address]   Next Appointment Scheduled: 8.15.11 Initial call taken by: Elna Breslow,  July 04, 2009 2:51 PM    Prescriptions: AMLODIPINE BESYLATE 10 MG TABS (AMLODIPINE BESYLATE) take one tablet daily  #30 x 1   Entered by:   Malachi Bonds   Authorized by:   Annye Asa MD   Signed by:   Malachi Bonds on 07/04/2009   Method used:   Electronically to        [facility] (retail)       [address]       [place], [place]  [address]       Ph: [phone]       Fax: [phone]   RxID:   [id number]

## 2010-03-04 NOTE — Consult Note (Signed)
Summary: [facility]   Imported By: Edmonia James 12/19/2009 12:42:16  _____________________________________________________________________  External Attachment:    Type:   Image     Comment:   External Document

## 2010-03-04 NOTE — Progress Notes (Signed)
Summary: cardiac clearance   Phone Note Call from Patient Call back at Work Phone [phone]   Caller: Patient Reason for Call: Talk to Nurse Summary of Call: having gastric bypass, needs cardiac clearance Initial call taken by: Darnell Level,  April 24, 2009 9:15 AM  Follow-up for Phone Call        spoke with pt, she will need to be seen for clearence. appt 06-05-09 @ 4pm Fredia Beets, RN  April 24, 2009 10:43 AM

## 2010-03-04 NOTE — Progress Notes (Signed)
Summary: amlodipine refill   Phone Note Refill Request Call back at [phone] Message from:  Pharmacy on September 03, 2009 8:36 AM  Refills Requested: Medication #1:  AMLODIPINE BESYLATE 10 MG TABS take one tablet daily   Dosage confirmed as above?Dosage Confirmed   Supply Requested: 1 month   Last Refilled: 08/06/2009 [facility] [address]  Next Appointment Scheduled: [id number] Initial call taken by: Osborn Coho,  September 03, 2009 8:37 AM    Prescriptions: AMLODIPINE BESYLATE 10 MG TABS (AMLODIPINE BESYLATE) take one tablet daily  #30 x 1   Entered by:   Malachi Bonds CMA   Authorized by:   Annye Asa MD   Signed by:   Malachi Bonds CMA on 09/03/2009   Method used:   Electronically to        [facility] (retail)       [address]       [place], [place]  [address]       Ph: [phone]       Fax: [phone]   RxID:   [id number]

## 2010-03-04 NOTE — Progress Notes (Signed)
Summary: onetouch ultra refill   Phone Note Refill Request Message from:  Patient  Refills Requested: Medication #1:  ONETOUCH ULTRA TEST  STRP test up to 6 times daily. Initial call taken by: Malachi Bonds,  July 16, 2009 9:37 AM    New/Updated Medications: ONETOUCH ULTRA TEST  STRP (GLUCOSE BLOOD) test up to 6 times daily Prescriptions: ONETOUCH ULTRA TEST  STRP (GLUCOSE BLOOD) test up to 6 times daily  #200 x 0   Entered by:   Malachi Bonds   Authorized by:   Annye Asa MD   Signed by:   Malachi Bonds on 07/16/2009   Method used:   Electronically to        [facility] (retail)       [address]       [place], [place]  [address]       Ph: [phone]       Fax: [phone]   RxID:   [id number]

## 2010-03-04 NOTE — Assessment & Plan Note (Signed)
Summary: sleepy while driving and all the time//lch   Vital Signs:  Patient profile:   40 year old female Height:      64.25 inches Weight:      246 pounds BMI:     42.05 Pulse rate:   62 / minute BP sitting:   110 / 60  (left arm)  Vitals Entered By: Malachi Bonds (May 10, 2009 4:11 PM) CC: having trouble staying awake and now it's affecting her driving   History of Present Illness: 40 yo woman here today for somnolence.  did not go to pulm appt back in Feb.  pt reports that no matter how many hours she sleeps at night she wakes feeling tired.  sleep is not restful.  fell asleep in the carpool lane x2.  family and friends report pt snores loudly.  no episodes of apnea that pt is aware of.  Current Medications (verified): 1)  Celexa 40 Mg Tabs (Citalopram Hydrobromide) .... One Tablet By Mouth Daily 2)  Amlodipine Besylate 10 Mg Tabs (Amlodipine Besylate) .... Take One Tablet Daily 3)  Crestor 10 Mg Tabs (Rosuvastatin Calcium) .... One Tablet By Mouth Daily 4)  Benazepril Hcl 40 Mg Tabs (Benazepril Hcl) .... Take One Tablet Daily 5)  Clonidine Hcl 0.1 Mg Tabs (Clonidine Hcl) .... Take One Tablet Two Times A Day 6)  Tricor 145 Mg Tabs (Fenofibrate) .... Take One Tablet Daily 7)  Novolog Penfill 100 Unit/ml Soln (Insulin Aspart) .... 50 Units Three Times A Day- Adjust As Directed in Office. 8)  Lantus Solostar 100 Unit/ml Soln (Insulin Glargine) .... 95 Units Daily 9)  Naprosyn 500 Mg Tabs (Naproxen) .Marland Kitchen.. 1 Two Times A Day As Needed For Pain.  Take W/ Food 10)  Aspirin 325 Mg Tabs (Aspirin) .... Take One Tablet Daily 11)  Metformin Hcl 1000 Mg Tabs (Metformin Hcl) .... Take 1 Tablet By Mouth Two Times A Day 12)  Gabapentin 300 Mg Caps (Gabapentin) .Marland Kitchen.. 1 Tab By Mouth At Bedtime.  Increase To Two Times A Day After 1 Week.  May Increase To Max of Three Times A Day. 13)  Triamcinolone Acetonide 0.1 % Oint (Triamcinolone Acetonide) .... Apply in Thin Layer Two Times A Day To Affected  Area.  Disp 1 Large Tube  Allergies (verified): No Known Drug Allergies  Past History:  Past Medical History: Last updated: 06/21/2008 Diabetes mellitus, type II Hyperlipidemia Hypertension Cardio Myopathy Anxiety Depression hypothalamic CVA 5/10  Review of Systems      See HPI  Physical Exam  General:  Obese white female awake, alert, NAD Mouth:  very narrow palatine arch, crowded post pharynx Neck:  very thick/short neck, but supple Lungs:  Normal respiratory effort, chest expands symmetrically. Lungs are clear to auscultation, no crackles or wheezes. Heart:  Normal rate and regular rhythm. S1 and S2 normal without gallop, murmur, click, rub or other extra sounds.   Impression & Recommendations:  Problem # 1:  SOMNOLENCE (ICD-780.09) Assessment Unchanged pt's sxs mostly likely due to sleep apnea.  stressed that she definitely needs to keep her pulm appt b/c she needs a sleep study.  reviewed that sleep meds, alcohol will only confuse sleep picture.  no new meds at this time. Orders: Pulmonary Referral (Pulmonary)  Complete Medication List: 1)  Celexa 40 Mg Tabs (Citalopram hydrobromide) .... One tablet by mouth daily 2)  Amlodipine Besylate 10 Mg Tabs (Amlodipine besylate) .... Take one tablet daily 3)  Crestor 10 Mg Tabs (Rosuvastatin calcium) .... One tablet  by mouth daily 4)  Benazepril Hcl 40 Mg Tabs (Benazepril hcl) .... Take one tablet daily 5)  Clonidine Hcl 0.1 Mg Tabs (Clonidine hcl) .... Take one tablet two times a day 6)  Tricor 145 Mg Tabs (Fenofibrate) .... Take one tablet daily 7)  Novolog Penfill 100 Unit/ml Soln (Insulin aspart) .... 50 units three times a day- adjust as directed in office. 8)  Lantus Solostar 100 Unit/ml Soln (Insulin glargine) .... 95 units daily 9)  Naprosyn 500 Mg Tabs (Naproxen) .Marland Kitchen.. 1 two times a day as needed for pain.  take w/ food 10)  Aspirin 325 Mg Tabs (Aspirin) .... Take one tablet daily 11)  Metformin Hcl 1000 Mg Tabs  (Metformin hcl) .... Take 1 tablet by mouth two times a day 12)  Gabapentin 300 Mg Caps (Gabapentin) .Marland Kitchen.. 1 tab by mouth at bedtime.  increase to two times a day after 1 week.  may increase to max of three times a day. 13)  Triamcinolone Acetonide 0.1 % Oint (Triamcinolone acetonide) .... Apply in thin layer two times a day to affected area.  disp 1 large tube  Patient Instructions: 1)  Someone will call you with your sleep study appts 2)  Keep calling neurology to get your clearance 3)  Hang in there!!!

## 2010-03-04 NOTE — Progress Notes (Signed)
Summary: refill  Phone Note Refill Request Message from:  Fax from Pharmacy on August 19, 2009 10:35 AM  Refills Requested: Medication #1:  TRICOR 145 MG TABS take one tablet daily   Dosage confirmed as above?Dosage Confirmed [facility] - fax [phone]  Initial call taken by: Arbie Co[place],  August 19, 2009 10:37 AM    Prescriptions: TRICOR 145 MG TABS (FENOFIBRATE) take one tablet daily  #30 x 6   Entered by:   Clearnce Sorrel CMA   Authorized by:   Annye Asa MD   Signed by:   Clearnce Sorrel CMA on 08/19/2009   Method used:   Electronically to        [facility] (retail)       [address]       [place], [place]  [address]       Ph: [phone]       Fax: [phone]   RxID:   [id number]

## 2010-03-04 NOTE — Letter (Signed)
Summary: [facility]  [facility]   Imported By: Edmonia James 10/11/2009 08:25:44  _____________________________________________________________________  External Attachment:    Type:   Image     Comment:   External Document

## 2010-03-04 NOTE — Progress Notes (Signed)
Summary: fyi re blood sugars  Phone Note Outgoing Call   Call placed by: Verdie Mosher,  Jun 14, 2009 3:10 PM Call placed to: Patient Summary of Call: pt is taking insulin as directed and informed will be referred to endocrinology .Verdie Mosher  Jun 14, 2009 3:10 PM    Follow-up for Phone Call        please put in referral- [facility], she can address it when she gets back Follow-up by: Annye Asa MD,  Jun 14, 2009 3:13 PM  Additional Follow-up for Phone Call Additional follow up Details #1::        DONE .Verdie Mosher  Jun 14, 2009 3:23 PM  Additional Follow-up by: Verdie Mosher,  Jun 14, 2009 3:23 PM

## 2010-03-04 NOTE — Progress Notes (Signed)
Summary: refill  Phone Note Call from Patient   Caller: Patient Summary of Call: patient is requesting a metformin. [facility] on [address]. patient stated she has been out of her meds for 4 days. Initial call taken by: Silva Bandy,  March 04, 2009 3:50 PM  Follow-up for Phone Call        LEFT MSG FOR PT DAD, RX FAXED TO [facility].Verdie Mosher  March 04, 2009 4:21 PM  Follow-up by: Verdie Mosher,  March 04, 2009 4:21 PM    Prescriptions: METFORMIN HCL 1000 MG TABS (METFORMIN HCL) Take 1 tablet by mouth two times a day  #60 x 3   Entered by:   Verdie Mosher   Authorized by:   Alda Berthold. Paz MD   Signed by:   Verdie Mosher on 03/04/2009   Method used:   F[facility]ed to ...       [facility]       [address]       [place], [place]  [address]       Ph: [phone]       Fax: [phone]   RxID:   [id number]

## 2010-03-04 NOTE — Assessment & Plan Note (Signed)
Summary: cpx/lab/cbs   Vital Signs:  Patient profile:   40 year old female Height:      64 inches (162.56 cm) Weight:      219.38 pounds (99.72 kg) BMI:     37.79 Temp:     98.5 degrees F (36.94 degrees C) oral BP sitting:   110 / 70  (left arm) Cuff size:   regular  Vitals Entered By: Ernestene Mention CMA (September 16, 2009 8:16 AM) CC: CPX./kb Is Patient Diabetic? No Pain Assessment Patient in pain? no        History of Present Illness: 40 yo woman here today for CPE.  1) DM- CBGs ranging from 52-258.  have been more steady since surgery.  holding Metformin due to inability to swallow this.  doing sliding scale lantus and novolog as needed.  had eye exam, 'stage 1 diabetes' in R eye.  2) HTN- BP well controlled.  no CP, SOB, HAs, visual changes, edema.  3) Hyperlipidemia- forgetting to take Crestor nightly.  diet has drastically changed.  4) s/p Gastric Bypass- done on 09/04/09, has follow up on 8/25.  has lost 27 lbs.  Preventive Screening-Counseling & Management  Alcohol-Tobacco     Alcohol drinks/day: 0     Smoking Status: quit  Caffeine-Diet-Exercise     Does Patient Exercise: yes     Type of exercise: walking      Drug Use:  never.    Problems Prior to Update: 1)  Contact or Exposure To Other Viral Diseases  (ICD-V01.79) 2)  Screening For Malignant Neoplasm of The Cervix  (ICD-V76.2) 3)  Physical Examination  (ICD-V70.0) 4)  Preoperative Examination  (ICD-V72.84) 5)  Cva  (ICD-434.91) 6)  Hypertension  (ICD-401.9) 7)  Hyperlipidemia  (ICD-272.4) 8)  Dysuria  (ICD-788.1) 9)  Nocturia  (ICD-788.43) 10)  Somnolence  (ICD-780.09) 11)  Contact Dermatitis  (ICD-692.9) 12)  Ringworm  (ICD-110.9) 13)  Numbness  (ICD-782.0) 14)  Weakness  (ICD-780.79) 15)  Obesity  (ICD-278.00) 16)  Depression  (ICD-311) 17)  Anxiety  (ICD-300.00) 18)  Diabetes Mellitus, Type II  (ICD-250.00)  Current Medications (verified): 1)  Celexa 40 Mg Tabs (Citalopram Hydrobromide) ....  One Tablet By Mouth Daily 2)  Amlodipine Besylate 10 Mg Tabs (Amlodipine Besylate) .... Take One Tablet Daily 3)  Crestor 10 Mg Tabs (Rosuvastatin Calcium) .... One Tablet By Mouth Daily 4)  Benazepril Hcl 40 Mg Tabs (Benazepril Hcl) .... Take One Tablet Daily 5)  Clonidine Hcl 0.1 Mg Tabs (Clonidine Hcl) .... Take One Tablet Two Times A Day 6)  Tricor 145 Mg Tabs (Fenofibrate) .... Take One Tablet Daily 7)  Novolog Penfill 100 Unit/ml Soln (Insulin Aspart) .... 50 Units Three Times A Day- Adjust As Directed in Office. 8)  Lantus Solostar 100 Unit/ml Soln (Insulin Glargine) .... 95 Units Daily 9)  Aspirin 325 Mg Tabs (Aspirin) .... Take One Tablet Daily 10)  Metformin Hcl 1000 Mg Tabs (Metformin Hcl) .... Take 1 Tablet By Mouth Two Times A Day 11)  Gabapentin 300 Mg Caps (Gabapentin) .Marland Kitchen.. 1 Tab By Mouth At Bedtime.  Increase To Two Times A Day After 1 Week.  May Increase To Max of Three Times A Day. 12)  Onetouch Ultra Test  Strp (Glucose Blood) .... Test Up To 6 Times Daily 13)  Ursodiol 300 Mg Caps (Ursodiol) .Marland Kitchen.. 1 By Mouth Bid 14)  Centrum Silver  Chew (Multiple Vitamins-Minerals) .Marland Kitchen.. 1 By Mouth Bid 15)  Colace 100 Mg Caps (Docusate Sodium) .Marland KitchenMarland KitchenMarland Kitchen 1  By Mouth Qhs 16)  Omeprazole 20 Mg Cpdr (Omeprazole) .Marland Kitchen.. 1 By Mouth Once Daily  Allergies (verified): No Known Drug Allergies  Past History:  Family History: Last updated: 05/01/2008 CAD-father HTN-father,siblings,mother DM-father STROKE-father COLON CA-father BREAST CA-no  Social History: Last updated: 06/05/2009 currently separating/divorcing husband- has restraining order custodian at [employer] Tobacco Use - No.  Alcohol Use - no  Past Surgical History: Left shoulder surgery Appendectomy R ovary and tube removed Hysteroscopy D&C and NovaSure ablation. gastric bypass 8/11  Social History: Smoking Status:  quit Does Patient Exercise:  yes  Review of Systems       The patient complains of anorexia.  The  patient denies fever, weight loss, weight gain, vision loss, decreased hearing, hoarseness, chest pain, syncope, dyspnea on exertion, peripheral edema, prolonged cough, headaches, abdominal pain, melena, hematochezia, severe indigestion/heartburn, hematuria, suspicious skin lesions, depression, abnormal bleeding, enlarged lymph nodes, and breast masses.    Physical Exam  General:  Obese white female awake, alert, NAD Head:  Normocephalic and atraumatic without obvious abnormalities. No apparent alopecia or balding. Eyes:  PERRL, EOMI, fundi WNL Ears:  External ear exam shows no significant lesions or deformities.  Otoscopic examination reveals clear canals, tympanic membranes are intact bilaterally without bulging, retraction, inflammation or discharge. Hearing is grossly normal bilaterally. Nose:  External nasal examination shows no deformity or inflammation. Nasal mucosa are pink and moist without lesions or exudates. Mouth:  very narrow palatine arch, crowded post pharynx, otherwise WNL Neck:  very thick/short neck, but supple Breasts:  No mass, nodules, thickening, tenderness, bulging, retraction, inflamation, nipple discharge or skin changes noted.   Lungs:  Normal respiratory effort, chest expands symmetrically. Lungs are clear to auscultation, no crackles or wheezes. Heart:  Normal rate and regular rhythm. S1 and S2 normal without gallop, murmur, click, rub or other extra sounds. Abdomen:  5 surgical incisions healing well, soft NT/ND, +BS Genitalia:  Pelvic Exam:        External: normal female genitalia without lesions or masses        Vagina: normal without lesions or masses        Cervix: normal without lesions or masses        Adnexa: normal bimanual exam without masses or fullness        Uterus: normal by palpation        Pap smear: performed Pulses:  +2 carotid, radial, DP, PT Extremities:  no C/C/E Neurologic:  cranial nerves II-XII intact, sensation intact to light touch with  exception of R 4th and 5th toes which are numb, and DTRs symmetrical and normal.  strength in R arm 4/5, L arm 5/5.  legs 5/5 and symmetric. Skin:  irregularly shaped and irregularly colored mole on R side in midaxillary line Cervical Nodes:  No lymphadenopathy noted Axillary Nodes:  No palpable lymphadenopathy Psych:  more engaged, good eye contact   Impression & Recommendations:  Problem # 1:  PHYSICAL EXAMINATION (ICD-V70.0) Assessment New pt's PE WNL w/ exception of abnormal mole (see below).  check labs.  due for mammogram- will refer.  anticipatory guidance provided. Orders: T-Vitamin D (25-Hydroxy) [id number]) Specimen Handling (99000) Radiology Referral (Radiology)  Problem # 2:  SCREENING FOR MALIGNANT NEOPLASM OF THE CERVIX (ICD-V76.2) Assessment: New pap collected  Problem # 3:  CONTACT OR EXPOSURE TO OTHER VIRAL DISEASES (ICD-V01.79) Assessment: New check labs- Gonorrhea and CT from pap. Orders: T-HIV Antibody  (Reflex) [id number]) T-RPR (Syphilis) [id number])  Problem # 4:  DIABETES MELLITUS, TYPE  II (ICD-250.00) Assessment: Unchanged sugars labile w/ recent surgery.  will await results of A1C and adjust meds as needed.  unable to take metformin due to size of pills.  will follow closely. Her updated medication list for this problem includes:    Benazepril Hcl 40 Mg Tabs (Benazepril hcl) .Marland Kitchen... Take one tablet daily    Novolog Penfill 100 Unit/ml Soln (Insulin aspart) .Marland KitchenMarland KitchenMarland KitchenMarland Kitchen 50 units three times a day- adjust as directed in office.    Lantus Solostar 100 Unit/ml Soln (Insulin glargine) .Marland Kitchen... 95 units daily    Aspirin 325 Mg Tabs (Aspirin) .Marland Kitchen... Take one tablet daily    Metformin Hcl 1000 Mg Tabs (Metformin hcl) .Marland Kitchen... Take 1 tablet by mouth two times a day  Orders: Venipuncture [id number]) TLB-BMP (Basic Metabolic Panel-BMET) (99991111) TLB-TSH (Thyroid Stimulating Hormone) (84443-TSH) TLB-A1C / Hgb A1C (Glycohemoglobin) (83036-A1C) Specimen Handling  (99000)  Problem # 5:  HYPERLIPIDEMIA (ICD-272.4) Assessment: Unchanged not taking Crestor regularly- forgetting.  aware she needs to take this regularly.  check labs. Her updated medication list for this problem includes:    Crestor 10 Mg Tabs (Rosuvastatin calcium) ..... One tablet by mouth daily    Tricor 145 Mg Tabs (Fenofibrate) .Marland Kitchen... Take one tablet daily  Orders: TLB-Hepatic/Liver Function Pnl (80076-HEPATIC) TLB-Lipid Panel (80061-LIPID)  Problem # 6:  HYPERTENSION (ICD-401.9) Assessment: Unchanged well controlled and asymptomatic.  may need to adjust meds as pt continues to lose weight.  will follow. Her updated medication list for this problem includes:    Amlodipine Besylate 10 Mg Tabs (Amlodipine besylate) .Marland Kitchen... Take one tablet daily    Benazepril Hcl 40 Mg Tabs (Benazepril hcl) .Marland Kitchen... Take one tablet daily    Clonidine Hcl 0.1 Mg Tabs (Clonidine hcl) .Marland Kitchen... Take one tablet two times a day  Orders: TLB-CBC Platelet - w/Differential (85025-CBCD) Specimen Handling (99000)  Problem # 7:  BARIATRIC SURGERY STATUS (ICD-V45.86) Assessment: New pt has follow up on 8/25.  recovering well from surgery.  Problem # 8:  NEVUS, ATYPICAL (ICD-216.9) Assessment: New refer to derm for removal as mole is irregular in shape and color. Orders: Dermatology Referral (Derma)  Complete Medication List: 1)  Celexa 40 Mg Tabs (Citalopram hydrobromide) .... One tablet by mouth daily 2)  Amlodipine Besylate 10 Mg Tabs (Amlodipine besylate) .... Take one tablet daily 3)  Crestor 10 Mg Tabs (Rosuvastatin calcium) .... One tablet by mouth daily 4)  Benazepril Hcl 40 Mg Tabs (Benazepril hcl) .... Take one tablet daily 5)  Clonidine Hcl 0.1 Mg Tabs (Clonidine hcl) .... Take one tablet two times a day 6)  Tricor 145 Mg Tabs (Fenofibrate) .... Take one tablet daily 7)  Novolog Penfill 100 Unit/ml Soln (Insulin aspart) .... 50 units three times a day- adjust as directed in office. 8)  Lantus  Solostar 100 Unit/ml Soln (Insulin glargine) .... 95 units daily 9)  Aspirin 325 Mg Tabs (Aspirin) .... Take one tablet daily 10)  Metformin Hcl 1000 Mg Tabs (Metformin hcl) .... Take 1 tablet by mouth two times a day 11)  Gabapentin 300 Mg Caps (Gabapentin) .Marland Kitchen.. 1 tab by mouth at bedtime.  increase to two times a day after 1 week.  may increase to max of three times a day. 12)  Onetouch Ultra Test Strp (Glucose blood) .... Test up to 6 times daily 13)  Ursodiol 300 Mg Caps (Ursodiol) .Marland Kitchen.. 1 by mouth bid 14)  Centrum Silver Chew (Multiple vitamins-minerals) .Marland Kitchen.. 1 by mouth bid 15)  Colace 100 Mg Caps (Docusate sodium) .Marland KitchenMarland KitchenMarland Kitchen  1 by mouth qhs 16)  Omeprazole 20 Mg Cpdr (Omeprazole) .Marland Kitchen.. 1 by mouth once daily  Patient Instructions: 1)  Schedule a follow up in 3 months to recheck your diabetes 2)  Someone will call you with your dermatology and mammogram appts 3)  We'll notify you of your lab results 4)  Try and get regular exercise 5)  Call with any questions or concerns 6)  Keep up the good work!  You're doing great!

## 2010-03-04 NOTE — Progress Notes (Signed)
Summary: novolog refill   Phone Note Refill Request Message from:  Fax from Pharmacy  Refills Requested: Medication #1:  NOVOLOG PENFILL 100 UNIT/ML SOLN 50 units three times a day- adjust as directed in office. [facility] [address] [id number]  Initial call taken by: Despina Arias,  February 18, 2009 8:50 AM    Prescriptions: NOVOLOG PENFILL 100 UNIT/ML SOLN (INSULIN ASPART) 50 units three times a day- adjust as directed in office.  #3 boxes x 3   Entered by:   Malachi Bonds   Authorized by:   Annye Asa MD   Signed by:   Malachi Bonds on 02/19/2009   Method used:   Printed then faxed to ...       [facility]       [address]       [place], [place]  [address]       Ph: [phone]       Fax: [phone]   RxID:   [id number]

## 2010-03-04 NOTE — Progress Notes (Signed)
Summary: amolodipine refill   Phone Note Refill Request Message from:  Fax from Pharmacy on May 06, 2009 9:21 AM  Refills Requested: Medication #1:  AMLODIPINE BESYLATE 10 MG TABS take one tablet daily [facility] [address] Joylene Igo [id number]   Method Requested: Fax to Local Pharmacy Next Appointment Scheduled: NO APPT Initial call taken by: Silva Bandy,  May 06, 2009 9:22 AM    Prescriptions: AMLODIPINE BESYLATE 10 MG TABS (AMLODIPINE BESYLATE) take one tablet daily  #30 x 1   Entered by:   Malachi Bonds   Authorized by:   Annye Asa MD   Signed by:   Malachi Bonds on 05/06/2009   Method used:   Electronically to        [facility] (retail)       [address]       [place], [place]  [address]       Ph: [phone]       Fax: [phone]   RxID:   [id number]

## 2010-03-04 NOTE — Progress Notes (Signed)
Summary: blood sugar readings   Phone Note Call from Patient   Caller: Patient Summary of Call: patient faxed over copy of glucose reading and they have been ranging from 46-253 after reviewing per Dr. Birdie Riddle patient is to hold lantus if blood sugars are <100 and hold novolog if blood sugars are <100 prior to meal and take metformin two times a day patient informed of instructions. Initial call taken by: Malachi Bonds CMA,  August 22, 2009 3:09 PM

## 2010-03-04 NOTE — Assessment & Plan Note (Signed)
Summary: back pian.cbs   Vital Signs:  Patient profile:   40 year old female Weight:      198 pounds BMI:     34.11 Pulse rate:   98 / minute BP sitting:   112 / 78  (left arm)  Vitals Entered By: Malachi Bonds CMA (December 02, 2009 8:59 AM) CC: back pain x2 weeks painful to do anything and pain going down R side of leg and causing numbness    History of Present Illness: 40 yo woman here today w/ back pain.  sxs started weekend of 10/23 after going camping.  went to ER 6 days ago- given prednisone and vicodin w/out relief.  pain is no longer in the back but more in the R hip and leg.  pain described as a burning that is shooting down the leg.  R foot is numb.  ER did not do any xrays.  Allergies (verified): No Known Drug Allergies  Review of Systems      See HPI  Physical Exam  General:  obviously uncomfortable Msk:  + TTP over lumbar spine, R buttock + SLR at 20-30 degrees on R knee flexion/extension 3-4/5 on R, 5/5 on L hip adduction/abduction 3-4/5 on R, 5/5 on L Pulses:  +2 DP/PT Extremities:  no C/C/E   Impression & Recommendations:  Problem # 1:  LUMBAR RADICULOPATHY (ICD-724.4) Assessment New pt obviously uncomfortable.  having weakness and numbness in R leg.  start steroids.  refer to ortho ASAP for likely disc issue Her updated medication list for this problem includes:    Aspirin 325 Mg Tabs (Aspirin) .Marland Kitchen... Take one tablet daily  Orders: Orthopedic Referral (Ortho)  Complete Medication List: 1)  Celexa 40 Mg Tabs (Citalopram hydrobromide) .... One tablet by mouth daily 2)  Amlodipine Besylate 10 Mg Tabs (Amlodipine besylate) .... Take one tablet daily 3)  Crestor 10 Mg Tabs (Rosuvastatin calcium) .... One tablet by mouth daily 4)  Benazepril Hcl 40 Mg Tabs (Benazepril hcl) .... Take one tablet daily 5)  Clonidine Hcl 0.1 Mg Tabs (Clonidine hcl) .... Take one tablet two times a day 6)  Tricor 145 Mg Tabs (Fenofibrate) .... Take one tablet daily 7)   Novolog Penfill 100 Unit/ml Soln (Insulin aspart) .... 50 units three times a day- adjust as directed in office. 8)  Lantus Solostar 100 Unit/ml Soln (Insulin glargine) .... 95 units daily 9)  Aspirin 325 Mg Tabs (Aspirin) .... Take one tablet daily 10)  Metformin Hcl 1000 Mg Tabs (Metformin hcl) .... Take 1 tablet by mouth two times a day 11)  Gabapentin 300 Mg Caps (Gabapentin) .Marland Kitchen.. 1 tab by mouth at bedtime.  increase to two times a day after 1 week.  may increase to max of three times a day. 12)  Onetouch Ultra Test Strp (Glucose blood) .... Test up to 6 times daily 13)  Ursodiol 300 Mg Caps (Ursodiol) .Marland Kitchen.. 1 by mouth bid 14)  Centrum Silver Chew (Multiple vitamins-minerals) .Marland Kitchen.. 1 by mouth bid 15)  Colace 100 Mg Caps (Docusate sodium) .Marland Kitchen.. 1 by mouth qhs 16)  Omeprazole 20 Mg Cpdr (Omeprazole) .Marland Kitchen.. 1 by mouth once daily 17)  Vitamin D (ergocalciferol) 50000 Unit Caps (Ergocalciferol) .Marland Kitchen.. 1 by mouth q week for 12 weeks 18)  Prednisone (pak) 10 Mg Tabs (Prednisone) .Marland Kitchen.. 12 day pack.  take as directed.  Patient Instructions: 1)  Please go to ortho 2)  Take the Prednisone as directed for the inflammation 3)  Use the sliding scale for  your elevated sugars (this is normal w/ prednisone) 4)  Hang in there!!! Prescriptions: PREDNISONE (PAK) 10 MG TABS (PREDNISONE) 12 day pack.  take as directed.  #1 pack x 0   Entered and Authorized by:   Annye Asa MD   Signed by:   Annye Asa MD on 12/02/2009   Method used:   Electronically to        [facility] (retail)       [address]       [place], [place]  [address]       Ph: [phone]       Fax: [phone]   RxID:   [id number]    Orders Added: 1)  Orthopedic Referral [Ortho] 2)  Est. Patient Level III [id number]

## 2010-03-04 NOTE — Letter (Signed)
Summary: Results Follow up Letter  [facility]   [place], [place] [address]   Phone: [phone]  Fax: [phone]    09/18/2009 MRN: [id number]  [facility] [address] [place], [place]  [address]  Dear Ms. MASTIN,  The following are the results of your recent test(s):  Test         Result    Pap Smear:        Normal __X___  Not Normal _____ Comments: REPEAT 1 YR. ______________________________________________________ Cholesterol: LDL(Bad cholesterol):         Your goal is less than:         HDL (Good cholesterol):       Your goal is more than: Comments:  ______________________________________________________ Mammogram:        Normal _____  Not Normal _____ Comments:  ___________________________________________________________________ Hemoccult:        Normal _____  Not normal _______ Comments:    _____________________________________________________________________ Other Tests:    We routinely do not discuss normal results over the telephone.  If you desire a copy of the results, or you have any questions about this information we can discuss them at your next office visit.   Sincerely,

## 2010-03-04 NOTE — Letter (Signed)
Summary: [facility]   Imported By: Edmonia James 09/17/2009 09:59:40  _____________________________________________________________________  External Attachment:    Type:   Image     Comment:   External Document

## 2010-03-05 ENCOUNTER — Encounter: Payer: Self-pay | Admitting: Family Medicine

## 2010-03-06 NOTE — Progress Notes (Signed)
Summary: refill  Phone Note Refill Request Message from:  Fax from Pharmacy on January 14, 2010 10:33 AM  Refills Requested: Medication #1:  METFORMIN HCL 1000 MG TABS Take 1 tablet by mouth two times a day [facility] [address] [facility] [place] fax [phone]   Initial call taken by: Arbie [place],  January 14, 2010 10:34 AM    Prescriptions: METFORMIN HCL 1000 MG TABS (METFORMIN HCL) Take 1 tablet by mouth two times a day  #60 Each x 0   Entered by:   Aron Baba CMA ([place])   Authorized by:   Annye Asa MD   Signed by:   Aron Baba CMA ([place]) on 01/14/2010   Method used:   Electronically to        [facility] (retail)       [address]       [place],   [address]       Ph: [phone]       Fax: [phone]   RxID:   [id number]

## 2010-03-06 NOTE — Progress Notes (Signed)
Summary: urine cx-  Phone Note Outgoing Call   Call placed by: Malachi Bonds CMA,  February 10, 2010 4:41 PM Call placed to: Patient Summary of Call: can start Keflex 500mg  two times a day x5 days   Follow-up for Phone Call        spoke w/ patient aware of lab.[facility] KitchenMalachi Bonds CMA  February 10, 2010 4:43 PM     New/Updated Medications: KEFLEX 500 MG CAPS (CEPHALEXIN) take one tablet two times a day x5 days Prescriptions: KEFLEX 500 MG CAPS (CEPHALEXIN) take one tablet two times a day x5 days  #10 x 0   Entered by:   Malachi Bonds CMA   Authorized by:   Annye Asa MD   Signed by:   Malachi Bonds CMA on 02/10/2010   Method used:   Electronically to        [facility] (retail)       [address]       [place], [place]  [address]       Ph: [phone]       Fax: [phone]   RxID:   [id number]

## 2010-03-11 ENCOUNTER — Encounter (INDEPENDENT_AMBULATORY_CARE_PROVIDER_SITE_OTHER): Payer: Self-pay | Admitting: *Deleted

## 2010-03-20 ENCOUNTER — Ambulatory Visit: Payer: Self-pay | Admitting: Family Medicine

## 2010-03-20 NOTE — Miscellaneous (Signed)
   Clinical Lists Changes  Medications: Rx of AMLODIPINE BESYLATE 10 MG TABS (AMLODIPINE BESYLATE) take one tablet daily;  #30 x 6;  Signed;  Entered by: Malachi Bonds CMA;  Authorized by: Annye Asa MD;  Method used: Electronically to [facility] [address], [place], [place], [place]  [address], Ph: [phone], Fax: [phone]    Prescriptions: AMLODIPINE BESYLATE 10 MG TABS (AMLODIPINE BESYLATE) take one tablet daily  #30 x 6   Entered by:   Malachi Bonds CMA   Authorized by:   Annye Asa MD   Signed by:   Malachi Bonds CMA on 03/11/2010   Method used:   Electronically to        [facility] (retail)       [address]       [place], [place]  [address]       Ph: [phone]       Fax: [phone]   RxID:   [id number]

## 2010-03-21 ENCOUNTER — Ambulatory Visit: Payer: Self-pay | Admitting: Family Medicine

## 2010-03-24 ENCOUNTER — Encounter: Payer: Self-pay | Admitting: Family Medicine

## 2010-03-24 ENCOUNTER — Encounter (INDEPENDENT_AMBULATORY_CARE_PROVIDER_SITE_OTHER): Payer: BC Managed Care – PPO | Admitting: Family Medicine

## 2010-03-24 ENCOUNTER — Other Ambulatory Visit: Payer: Self-pay | Admitting: Family Medicine

## 2010-03-24 DIAGNOSIS — E785 Hyperlipidemia, unspecified: Secondary | ICD-10-CM

## 2010-03-24 DIAGNOSIS — E119 Type 2 diabetes mellitus without complications: Secondary | ICD-10-CM

## 2010-03-24 DIAGNOSIS — J019 Acute sinusitis, unspecified: Secondary | ICD-10-CM | POA: Insufficient documentation

## 2010-03-24 LAB — HEPATIC FUNCTION PANEL
ALT: 21 U/L (ref 0–35)
AST: 20 U/L (ref 0–37)
Albumin: 3.9 g/dL (ref 3.5–5.2)
Alkaline Phosphatase: 61 U/L (ref 39–117)
Total Protein: 6.6 g/dL (ref 6.0–8.3)

## 2010-03-24 LAB — LIPID PANEL
HDL: 31 mg/dL — ABNORMAL LOW (ref >39.00)
Triglycerides: 180 mg/dL — ABNORMAL HIGH (ref 0.0–149.0)

## 2010-03-24 LAB — BASIC METABOLIC PANEL
BUN: 9 mg/dL (ref 6–23)
CO2: 29 mEq/L (ref 19–32)
Calcium: 9.6 mg/dL (ref 8.4–10.5)
Chloride: 103 mEq/L (ref 96–112)
Creatinine, Ser: 0.8 mg/dL (ref 0.4–1.2)
GFR: 85.08 mL/min (ref >60.00)
Glucose, Bld: 148 mg/dL — ABNORMAL HIGH (ref 70–99)
Potassium: 4.4 mEq/L (ref 3.5–5.1)
Sodium: 140 mEq/L (ref 135–145)

## 2010-03-24 LAB — HEMOGLOBIN A1C: Hgb A1c MFr Bld: 6.7 % — ABNORMAL HIGH (ref 4.6–6.5)

## 2010-03-25 ENCOUNTER — Ambulatory Visit: Payer: Self-pay | Admitting: Family Medicine

## 2010-04-01 NOTE — Assessment & Plan Note (Signed)
Summary: 3 month diabetes check/ph--resch from Fri 2/17///sph  Medications Added AMOXICILLIN 875 MG TABS (AMOXICILLIN) 1 tab by mouth two times a day x10 days.  take w/ food.      Allergies Added: NKDA Nurse Visit   Vital Signs:  Patient profile:   40 year old female Weight:      186 pounds BMI:     32.04 Pulse rate:   72 / minute BP sitting:   110 / 80  (left arm)  Vitals Entered By: Malachi Bonds CMA (March 24, 2010 9:13 AM)  History of Present Illness: 40 yo woman here today for  dm- no CBGs >175.  only on Metformin.  no symptomatic lows since stopping lantus.  no CP, SOB, HAs, visual changes, edema.  illness- son recently sick.  started vomiting Tuesday night.  developed sore throat.  + HA, nasal congestion.  no longer having vomiting or diarrhea.  low grade temp on Friday.  no ear pain but pressure.  + facial pain.  hyperlipidemia- due for cholesterol recheck.  still on fenofibrate daily.  denies abd pain, N/V.  CC: 3 month roa and NVD xtues.   Current Medications (verified): 1)  Celexa 40 Mg Tabs (Citalopram Hydrobromide) .... Take One Tablet Daily 2)  Amlodipine Besylate 10 Mg Tabs (Amlodipine Besylate) .... Take One Tablet Daily 3)  Benazepril Hcl 40 Mg Tabs (Benazepril Hcl) .... Take One Tablet Daily 4)  Clonidine Hcl 0.1 Mg Tabs (Clonidine Hcl) .... Take One Tablet Two Times A Day 5)  Fenofibrate 160 Mg Tabs (Fenofibrate) .Marland Kitchen.. 1 Tab Daily 6)  Novolog Penfill 100 Unit/ml Soln (Insulin Aspart) .... 50 Units Three Times A Day- Adjust As Directed in Office. 7)  Lantus Solostar 100 Unit/ml Soln (Insulin Glargine) .... 95 Units Daily 8)  Metformin Hcl 1000 Mg Tabs (Metformin Hcl) .... Take 1 Tablet By Mouth Two Times A Day 9)  Onetouch Ultra Test  Strp (Glucose Blood) .... Test Up To 6 Times Daily 10)  Amoxicillin 875 Mg Tabs (Amoxicillin) .Marland Kitchen.. 1 Tab By Mouth Two Times A Day X10 Days.  Take W/ Food.  Allergies (verified): No Known Drug Allergies  Past  History:  Past medical, surgical, family and social histories (including risk factors) reviewed, and no changes noted (except as noted below).  Past Medical History: Reviewed history from 06/05/2009 and no changes required. CVA (ICD-434.91) HYPERTENSION (ICD-401.9) HYPERLIPIDEMIA (ICD-272.4) OBESITY (ICD-278.00) DEPRESSION (ICD-311) ANXIETY (ICD-300.00) DIABETES MELLITUS, TYPE II (ICD-250.00)  Past Surgical History: Reviewed history from 09/16/2009 and no changes required. Left shoulder surgery Appendectomy R ovary and tube removed Hysteroscopy D&C and NovaSure ablation. gastric bypass 8/11   Review of Systems      See HPI   Physical Exam  General:  obviously not feeling well Head:  Normocephalic and atraumatic without obvious abnormalities. No apparent alopecia or balding.  + TTP over frontal and maxillary sinuses Eyes:  no injxn or inflammation Ears:  External ear exam shows no significant lesions or deformities.  Otoscopic examination reveals clear canals, tympanic membranes are intact bilaterally without bulging, retraction, inflammation or discharge. Hearing is grossly normal bilaterally. Nose:  + congestion Mouth:  very narrow palatine arch, crowded post pharynx, otherwise WNL Neck:  No deformities, masses, or tenderness noted. Lungs:  Normal respiratory effort, chest expands symmetrically. Lungs are clear to auscultation, no crackles or wheezes. Heart:  Normal rate and regular rhythm. S1 and S2 normal without gallop, murmur, click, rub or other extra sounds. Abdomen:  soft, NT/ND, +BS Pulses:  +  2 carotid, radial, DP/PT Extremities:  no C/C/E    Impression & Recommendations:  Problem # 1:  SINUSITIS - ACUTE-NOS (ICD-461.9) Assessment New pt's sxs and PE consistent w/ sinus infxn.  start amox.  reviewed supportive care and red flags that should prompt return.  Pt expresses understanding and is in agreement w/ this plan. Her updated medication list for this  problem includes:    Amoxicillin 875 Mg Tabs (Amoxicillin) .Marland Kitchen... 1 tab by mouth two times a day x10 days.  take w/ food.  Problem # 2:  DIABETES MELLITUS, TYPE II (ICD-250.00) Assessment: Unchanged no longer on lantus or novolog- only on Metformin.  check A1C, adjust meds as needed. The following medications were removed from the medication list:    Novolog Penfill 100 Unit/ml Soln (Insulin aspart) .Marland KitchenMarland KitchenMarland KitchenMarland Kitchen 50 units three times a day- adjust as directed in office.    Lantus Solostar 100 Unit/ml Soln (Insulin glargine) .Marland Kitchen... 95 units daily Her updated medication list for this problem includes:    Benazepril Hcl 40 Mg Tabs (Benazepril hcl) .Marland Kitchen... Take one tablet daily    Metformin Hcl 1000 Mg Tabs (Metformin hcl) .Marland Kitchen... Take 1 tablet by mouth two times a day  Orders: Venipuncture [id number]) TLB-BMP (Basic Metabolic Panel-BMET) (99991111) TLB-A1C / Hgb A1C (Glycohemoglobin) (83036-A1C) Specimen Handling (99000)  Problem # 3:  HYPERLIPIDEMIA (ICD-272.4) Assessment: Unchanged check labs and adjust meds as needed. Her updated medication list for this problem includes:    Fenofibrate 160 Mg Tabs (Fenofibrate) .Marland Kitchen... 1 tab daily  Orders: TLB-Lipid Panel (80061-LIPID) TLB-Hepatic/Liver Function Pnl (80076-HEPATIC) Specimen Handling (99000)  Complete Medication List: 1)  Celexa 40 Mg Tabs (Citalopram hydrobromide) .... Take one tablet daily 2)  Amlodipine Besylate 10 Mg Tabs (Amlodipine besylate) .... Take one tablet daily 3)  Benazepril Hcl 40 Mg Tabs (Benazepril hcl) .... Take one tablet daily 4)  Clonidine Hcl 0.1 Mg Tabs (Clonidine hcl) .... Take one tablet two times a day 5)  Fenofibrate 160 Mg Tabs (Fenofibrate) .Marland Kitchen.. 1 tab daily 6)  Metformin Hcl 1000 Mg Tabs (Metformin hcl) .... Take 1 tablet by mouth two times a day 7)  Onetouch Ultra Test Strp (Glucose blood) .... Test up to 6 times daily 8)  Amoxicillin 875 Mg Tabs (Amoxicillin) .Marland Kitchen.. 1 tab by mouth two times a day x10 days.  take w/  food.   Patient Instructions: 1)  Please schedule a follow-up appointment in 3 months to recheck diabetes. 2)  You have a sinus infection- take the amoxicillin as directed.  take w/ food to avoid upset stomach 3)  Drink plenty of fluids 4)  Rest! 5)  We'll notify you of your lab results 6)  Call with any questions or concerns 7)  Hang in there!!!    Orders Added: 1)  Venipuncture [id number] 2)  TLB-BMP (Basic Metabolic Panel-BMET) [id number] 3)  TLB-A1C / Hgb A1C (Glycohemoglobin) [83036-A1C] 4)  TLB-Lipid Panel [80061-LIPID] 5)  TLB-Hepatic/Liver Function Pnl [80076-HEPATIC] 6)  Specimen Handling [99000] 7)  Est. Patient Level IV [id number] Prescriptions: METFORMIN HCL 1000 MG TABS (METFORMIN HCL) Take 1 tablet by mouth two times a day  #180 x 3   Entered and Authorized by:   Annye Asa MD   Signed by:   Annye Asa MD on 03/24/2010   Method used:   Electronically to        [facility].* (retail)       [address]  [place], [place]  [address]       Ph: [phone]       Fax: [phone]   RxID:   [id number] FENOFIBRATE 160 MG TABS (FENOFIBRATE) 1 tab daily  #90 x 3   Entered and Authorized by:   Annye Asa MD   Signed by:   Annye Asa MD on 03/24/2010   Method used:   Electronically to        [name].* (retail)       [address]       [place], [place]  [address]       Ph: [phone]       Fax: [phone]   RxID:   [id number] CLONIDINE HCL 0.1 MG TABS (CLONIDINE HCL) take one tablet two times a day  #180 x 3   Entered and Authorized by:   Annye Asa MD   Signed by:   Annye Asa MD on 03/24/2010   Method used:   Electronically to        [facility].* (retail)       [address]       [place], [place]  [address]       Ph: [phone]       Fax: [phone]   RxID:   [id number] BENAZEPRIL  HCL 40 MG TABS (BENAZEPRIL HCL) take one tablet daily  #90 x 3   Entered and Authorized by:   Annye Asa MD   Signed by:   Annye Asa MD on 03/24/2010   Method used:   Electronically to        [facility].* (retail)       [address]       [place], [place]  [address]       Ph: [phone]       Fax: [phone]   RxID:   [id number] AMLODIPINE BESYLATE 10 MG TABS (AMLODIPINE BESYLATE) take one tablet daily  #90 x 3   Entered and Authorized by:   Annye Asa MD   Signed by:   Annye Asa MD on 03/24/2010   Method used:   Electronically to        [facility].* (retail)       [address]       [place], [place]  [address]       Ph: [phone]       Fax: [phone]   RxID:   [id number] CELEXA 40 MG TABS (CITALOPRAM HYDROBROMIDE) take one tablet daily  #90 x 3   Entered and Authorized by:   Annye Asa MD   Signed by:   Annye Asa MD on 03/24/2010   Method used:   Electronically to        [facility].* (retail)       [address]       [place],   [address]       Ph: [phone]       Fax: [phone]   RxID:   [id number] AMOXICILLIN 875 MG TABS (AMOXICILLIN) 1 tab by mouth two times a day x10 days.  take w/ food.  #20 x 0   Entered and Authorized by:   Annye Asa MD  Signed by:   Annye Asa MD on 03/24/2010   Method used:   Electronically to        [facility] (retail)       [address]       [place], [place]  [address]       Ph: [phone]       Fax: [phone]   RxID:   [id number]

## 2010-04-07 ENCOUNTER — Telehealth: Payer: Self-pay | Admitting: Family Medicine

## 2010-04-14 ENCOUNTER — Ambulatory Visit (INDEPENDENT_AMBULATORY_CARE_PROVIDER_SITE_OTHER): Payer: BC Managed Care – PPO | Admitting: Family Medicine

## 2010-04-14 ENCOUNTER — Encounter: Payer: Self-pay | Admitting: Family Medicine

## 2010-04-14 DIAGNOSIS — N390 Urinary tract infection, site not specified: Secondary | ICD-10-CM

## 2010-04-14 LAB — CONVERTED CEMR LAB
Blood in Urine, dipstick: NEGATIVE
Nitrite: NEGATIVE
Protein, U semiquant: NEGATIVE
Specific Gravity, Urine: <1.005
Urobilinogen, UA: NEGATIVE

## 2010-04-15 ENCOUNTER — Encounter: Payer: Self-pay | Admitting: Family Medicine

## 2010-04-15 NOTE — Progress Notes (Signed)
Summary: Refill--Test strips  Phone Note Refill Request Message from:  Patient on April 07, 2010 2:54 PM  Refills Requested: Medication #1:  ONETOUCH ULTRA TEST  STRP test up to 6 times daily Patient notes on vm that pharmacy will send a fax, so I refilled med anyway.  Initial call taken by: Ernestene Mention CMA,  April 07, 2010 2:54 PM    Prescriptions: [name] TEST  STRP (GLUCOSE BLOOD) test up to 6 times daily  #200 x 0   Entered by:   Ernestene Mention CMA   Authorized by:   Annye Asa MD   Signed by:   Ernestene Mention CMA on 04/07/2010   Method used:   Electronically to        [facility].* (retail)       [address]       [place], [place]  [address]       Ph: [phone]       Fax: [phone]   RxID:   [id number]

## 2010-04-18 ENCOUNTER — Telehealth: Payer: Self-pay | Admitting: Family Medicine

## 2010-04-21 LAB — BASIC METABOLIC PANEL
BUN: 10 mg/dL (ref 6–23)
Calcium: 9.5 mg/dL (ref 8.4–10.5)
Creatinine, Ser: 0.87 mg/dL (ref 0.4–1.2)
GFR calc Af Amer: >60 mL/min (ref >60)
GFR calc non Af Amer: >60 mL/min (ref >60)

## 2010-04-21 LAB — RAPID URINE DRUG SCREEN, HOSP PERFORMED
Amphetamines: NOT DETECTED
Benzodiazepines: NOT DETECTED
Tetrahydrocannabinol: NOT DETECTED

## 2010-04-21 LAB — COMPREHENSIVE METABOLIC PANEL
ALT: 26 U/L (ref 0–35)
AST: 18 U/L (ref 0–37)
Albumin: 3.5 g/dL (ref 3.5–5.2)
Alkaline Phosphatase: 52 U/L (ref 39–117)
BUN: 8 mg/dL (ref 6–23)
CO2: 25 mEq/L (ref 19–32)
Calcium: 9 mg/dL (ref 8.4–10.5)
Chloride: 105 mEq/L (ref 96–112)
Creatinine, Ser: 0.93 mg/dL (ref 0.4–1.2)
GFR calc Af Amer: >60 mL/min (ref >60)
GFR calc non Af Amer: >60 mL/min (ref >60)
Glucose, Bld: 77 mg/dL (ref 70–99)
Potassium: 4.8 mEq/L (ref 3.5–5.1)
Sodium: 137 mEq/L (ref 135–145)
Total Bilirubin: 0.5 mg/dL (ref 0.3–1.2)

## 2010-04-21 LAB — URINALYSIS, ROUTINE W REFLEX MICROSCOPIC
Glucose, UA: NEGATIVE mg/dL
Hgb urine dipstick: NEGATIVE
Ketones, ur: NEGATIVE mg/dL
Protein, ur: NEGATIVE mg/dL

## 2010-04-21 LAB — CBC
MCHC: 32.7 g/dL (ref 30.0–36.0)
MCV: 72.8 fL — ABNORMAL LOW (ref 78.0–100.0)
Platelets: 225 10*3/uL (ref 150–400)
RBC: 5.05 MIL/uL (ref 3.87–5.11)
RDW: 15.8 % — ABNORMAL HIGH (ref 11.5–15.5)
WBC: 8.8 10*3/uL (ref 4.0–10.5)

## 2010-04-21 LAB — LIPID PANEL
Cholesterol: 120 mg/dL (ref 0–200)
HDL: 23 mg/dL — ABNORMAL LOW (ref >39)
Total CHOL/HDL Ratio: 5.2 RATIO
Triglycerides: 232 mg/dL — ABNORMAL HIGH (ref <150)

## 2010-04-21 LAB — PROTIME-INR
INR: 0.99 (ref 0.00–1.49)
Prothrombin Time: 13 seconds (ref 11.6–15.2)

## 2010-04-21 LAB — DIFFERENTIAL
Basophils Absolute: 0.1 10*3/uL (ref 0.0–0.1)
Basophils Relative: 1 % (ref 0–1)
Eosinophils Absolute: 0.3 10*3/uL (ref 0.0–0.7)
Eosinophils Relative: 3 % (ref 0–5)
Lymphocytes Relative: 19 % (ref 12–46)
Lymphs Abs: 1.6 10*3/uL (ref 0.7–4.0)
Monocytes Absolute: 0.5 10*3/uL (ref 0.1–1.0)
Neutro Abs: 7.1 10*3/uL (ref 1.7–7.7)
Neutrophils Relative %: 74 % (ref 43–77)

## 2010-04-21 LAB — GLUCOSE, CAPILLARY
Glucose-Capillary: 232 mg/dL — ABNORMAL HIGH (ref 70–99)
Glucose-Capillary: 321 mg/dL — ABNORMAL HIGH (ref 70–99)

## 2010-04-21 LAB — HEMOGLOBIN A1C
Hgb A1c MFr Bld: 8.5 % — ABNORMAL HIGH (ref <5.7)
Mean Plasma Glucose: 197 mg/dL — ABNORMAL HIGH (ref <117)

## 2010-04-21 LAB — CK TOTAL AND CKMB (NOT AT ARMC): Relative Index: INVALID (ref 0.0–2.5)

## 2010-04-22 NOTE — Progress Notes (Signed)
Summary: UTI still present  Phone Note Call from Patient Call back at Work Phone [phone]   Summary of Call: Patient called noting that she does not think Cipro treated her UTI. Patient would like to know if she should repeat ABX or try new ones. She notes it is better than it was, but is not gone. Please advise.  Initial call taken by: Ernestene Mention CMA,  April 18, 2010 10:46 AM  Follow-up for Phone Call        her urine culture didn't show that she actually needed antibiotics to treat the multilpe bacteria present (we all have bacteria to some degree).  she should continue to drink lots of water, consider cranberry juice, and if symptoms continue or worsen she should let us know.  she may need an appt w/ urology to make sure there is not something else going on Follow-up by: Annye Asa MD,  April 18, 2010 10:55 AM  Additional Follow-up for Phone Call Additional follow up Details #1::        Patient notified.  Additional Follow-up by: Ernestene Mention CMA,  April 18, 2010 11:11 AM

## 2010-04-22 NOTE — Assessment & Plan Note (Signed)
Summary: ?UTI/NTA   Vital Signs:  Patient profile:   40 year old female Height:      64 inches (162.56 cm) Weight:      187.13 pounds (85.06 kg) BMI:     32.24 Temp:     98.6 degrees F (37.00 degrees C) oral BP sitting:   130 / 68  (left arm) Cuff size:   regular  Vitals Entered By: Ernestene Mention CMA (April 14, 2010 2:43 PM) CC: Possible UTI./kb Is Patient Diabetic? Yes Pain Assessment Patient in pain? yes     Location: abdomen Intensity: 6 Type: cramp Comments Patient notes that she has been having abd pain, increased urinary frequency, and dysuria. She denies fever.   CC:  Possible UTI./kb.  History of Present Illness: 40 yo woman here today for ? UTI.  sxs started 5 days ago.  + dysuria, frequency, urgency.  no hematuria.  + abd pain.  no fevers.  having sex more regularly w/ new boyfriend.  Current Medications (verified): 1)  Celexa 40 Mg Tabs (Citalopram Hydrobromide) .... Take One Tablet Daily 2)  Amlodipine Besylate 10 Mg Tabs (Amlodipine Besylate) .... Take One Tablet Daily 3)  Benazepril Hcl 40 Mg Tabs (Benazepril Hcl) .... Take One Tablet Daily 4)  Clonidine Hcl 0.1 Mg Tabs (Clonidine Hcl) .... Take One Tablet Two Times A Day 5)  Fenofibrate 160 Mg Tabs (Fenofibrate) .Marland Kitchen.. 1 Tab Daily 6)  Metformin Hcl 1000 Mg Tabs (Metformin Hcl) .... Take 1 Tablet By Mouth Two Times A Day 7)  Onetouch Ultra Test  Strp (Glucose Blood) .... Test Up To 6 Times Daily 8)  Mvi .... Daily  Allergies (verified): No Known Drug Allergies  Review of Systems      See HPI  Physical Exam  General:  Well-developed,well-nourished,in no acute distress; alert,appropriate and cooperative throughout examination Abdomen:  soft, NT/ND, +BS, no suprapubic or CVA tenderness   Impression & Recommendations:  Problem # 1:  UTI (ICD-599.0) Assessment New pt's UA and sxs consistent w/ UTI.  start abx.  reviewed supportive care and red flags that should prompt return.  Pt expresses understanding  and is in agreement w/ this plan. The following medications were removed from the medication list:    Amoxicillin 875 Mg Tabs (Amoxicillin) .Marland Kitchen... 1 tab by mouth two times a day x10 days.  take w/ food. Her updated medication list for this problem includes:    Cipro 500 Mg Tabs (Ciprofloxacin hcl) .Marland Kitchen... 1 by mouth 2 times daily  Orders: Specimen Handling (99000) UA Dipstick W/ Micro (manual) (81000) T-Culture, Urine [id number])  Complete Medication List: 1)  Celexa 40 Mg Tabs (Citalopram hydrobromide) .... Take one tablet daily 2)  Amlodipine Besylate 10 Mg Tabs (Amlodipine besylate) .... Take one tablet daily 3)  Benazepril Hcl 40 Mg Tabs (Benazepril hcl) .... Take one tablet daily 4)  Clonidine Hcl 0.1 Mg Tabs (Clonidine hcl) .... Take one tablet two times a day 5)  Fenofibrate 160 Mg Tabs (Fenofibrate) .Marland Kitchen.. 1 tab daily 6)  Metformin Hcl 1000 Mg Tabs (Metformin hcl) .... Take 1 tablet by mouth two times a day 7)  Onetouch Ultra Test Strp (Glucose blood) .... Test up to 6 times daily 8)  Mvi  .... Daily 9)  Cipro 500 Mg Tabs (Ciprofloxacin hcl) .Marland Kitchen.. 1 by mouth 2 times daily  Patient Instructions: 1)  Follow up as scheduled 2)  Take the Cipro as directed for UTI 3)  Drink LOTS of water 4)  Hang in there!! Prescriptions:  CIPRO 500 MG TABS (CIPROFLOXACIN HCL) 1 by mouth 2 times daily  #6 x 0   Entered and Authorized by:   Annye Asa MD   Signed by:   Annye Asa MD on 04/14/2010   Method used:   Electronically to        [facility].* (retail)       [address]       [place], [place]  [address]       Ph: [phone]       Fax: [phone]   RxID:   [id number]    Orders Added: 1)  Specimen Handling [99000] 2)  UA Dipstick W/ Micro (manual) [81000] 3)  T-Culture, Urine [id number] 4)  Est. Patient Level III [id number]    Laboratory Results   Urine Tests   Date/Time Reported: April 14, 2010 2:44 PM   Routine  Urinalysis   Color: lt. yellow Appearance: Clear Glucose: negative   (Normal Range: Negative) Bilirubin: negative   (Normal Range: Negative) Ketone: negative   (Normal Range: Negative) Spec. Gravity: <1.005   (Normal Range: 1.003-1.035) Blood: negative   (Normal Range: Negative) pH: 5.0   (Normal Range: 5.0-8.0) Protein: negative   (Normal Range: Negative) Urobilinogen: negative   (Normal Range: 0-1) Nitrite: negative   (Normal Range: Negative) Leukocyte Esterace: moderate   (Normal Range: Negative)    Comments: Heath Lark  April 14, 2010 2:44 PM

## 2010-05-13 ENCOUNTER — Ambulatory Visit (INDEPENDENT_AMBULATORY_CARE_PROVIDER_SITE_OTHER): Payer: BC Managed Care – PPO | Admitting: Family Medicine

## 2010-05-13 ENCOUNTER — Encounter: Payer: Self-pay | Admitting: Family Medicine

## 2010-05-13 VITALS — BP 130/86 | HR 93 | Wt 185.2 lb

## 2010-05-13 DIAGNOSIS — M79609 Pain in unspecified limb: Secondary | ICD-10-CM

## 2010-05-13 DIAGNOSIS — M79673 Pain in unspecified foot: Secondary | ICD-10-CM

## 2010-05-13 LAB — LIPID PANEL
Cholesterol: 120 mg/dL (ref 0–200)
HDL: 20 mg/dL — ABNORMAL LOW (ref >39)
LDL Cholesterol: 70 mg/dL (ref 0–99)
Total CHOL/HDL Ratio: 6 RATIO
Triglycerides: 150 mg/dL — ABNORMAL HIGH (ref <150)

## 2010-05-13 LAB — BASIC METABOLIC PANEL
BUN: 12 mg/dL (ref 6–23)
BUN: 14 mg/dL (ref 6–23)
BUN: 9 mg/dL (ref 6–23)
CO2: 27 mEq/L (ref 19–32)
Chloride: 105 mEq/L (ref 96–112)
Chloride: 105 mEq/L (ref 96–112)
Creatinine, Ser: 0.92 mg/dL (ref 0.4–1.2)
Creatinine, Ser: 0.98 mg/dL (ref 0.4–1.2)
GFR calc Af Amer: >60 mL/min (ref >60)
GFR calc non Af Amer: >60 mL/min (ref >60)
GFR calc non Af Amer: >60 mL/min (ref >60)
Glucose, Bld: 118 mg/dL — ABNORMAL HIGH (ref 70–99)
Glucose, Bld: 199 mg/dL — ABNORMAL HIGH (ref 70–99)
Glucose, Bld: 200 mg/dL — ABNORMAL HIGH (ref 70–99)
Potassium: 3.9 mEq/L (ref 3.5–5.1)
Potassium: 3.9 mEq/L (ref 3.5–5.1)
Potassium: 4 mEq/L (ref 3.5–5.1)
Potassium: 4 mEq/L (ref 3.5–5.1)
Sodium: 133 mEq/L — ABNORMAL LOW (ref 135–145)
Sodium: 138 mEq/L (ref 135–145)

## 2010-05-13 LAB — GLUCOSE, CAPILLARY
Glucose-Capillary: 116 mg/dL — ABNORMAL HIGH (ref 70–99)
Glucose-Capillary: 128 mg/dL — ABNORMAL HIGH (ref 70–99)
Glucose-Capillary: 165 mg/dL — ABNORMAL HIGH (ref 70–99)
Glucose-Capillary: 165 mg/dL — ABNORMAL HIGH (ref 70–99)
Glucose-Capillary: 174 mg/dL — ABNORMAL HIGH (ref 70–99)
Glucose-Capillary: 196 mg/dL — ABNORMAL HIGH (ref 70–99)
Glucose-Capillary: 199 mg/dL — ABNORMAL HIGH (ref 70–99)
Glucose-Capillary: 225 mg/dL — ABNORMAL HIGH (ref 70–99)
Glucose-Capillary: 234 mg/dL — ABNORMAL HIGH (ref 70–99)
Glucose-Capillary: 240 mg/dL — ABNORMAL HIGH (ref 70–99)
Glucose-Capillary: 254 mg/dL — ABNORMAL HIGH (ref 70–99)
Glucose-Capillary: 303 mg/dL — ABNORMAL HIGH (ref 70–99)
Glucose-Capillary: 77 mg/dL (ref 70–99)

## 2010-05-13 LAB — CBC
HCT: 40.6 % (ref 36.0–46.0)
Hemoglobin: 13.6 g/dL (ref 12.0–15.0)
MCHC: 33.6 g/dL (ref 30.0–36.0)
MCV: 75.6 fL — ABNORMAL LOW (ref 78.0–100.0)
Platelets: 237 10*3/uL (ref 150–400)
RDW: 14.1 % (ref 11.5–15.5)

## 2010-05-13 LAB — MAGNESIUM: Magnesium: 2 mg/dL (ref 1.5–2.5)

## 2010-05-13 LAB — ANTITHROMBIN III: AntiThromb III Func: 101 % (ref 76–126)

## 2010-05-13 LAB — CARDIOLIPIN ANTIBODIES, IGG, IGM, IGA
Anticardiolipin IgA: <7 [APL'U] — ABNORMAL LOW (ref <13)
Anticardiolipin IgG: <7 [GPL'U] — ABNORMAL LOW (ref <11)

## 2010-05-13 LAB — APTT: aPTT: 25 seconds (ref 24–37)

## 2010-05-13 LAB — BETA-2-GLYCOPROTEIN I ABS, IGG/M/A
Beta-2-Glycoprotein I IgA: 5 U/mL (ref <10)
Beta-2-Glycoprotein I IgM: <4 U/mL (ref <10)

## 2010-05-13 LAB — LUPUS ANTICOAGULANT PANEL: Lupus Anticoagulant: NOT DETECTED

## 2010-05-13 LAB — TSH: TSH: 1.715 u[IU]/mL (ref 0.350–4.500)

## 2010-05-13 LAB — URINALYSIS, ROUTINE W REFLEX MICROSCOPIC
Bilirubin Urine: NEGATIVE
Nitrite: NEGATIVE
Specific Gravity, Urine: 1.016 (ref 1.005–1.030)
Urobilinogen, UA: 0.2 mg/dL (ref 0.0–1.0)

## 2010-05-13 LAB — VITAMIN B12: Vitamin B-12: 500 pg/mL (ref 211–911)

## 2010-05-13 LAB — HEMOGLOBIN A1C
Hgb A1c MFr Bld: 8.9 % — ABNORMAL HIGH (ref 4.6–6.1)
Mean Plasma Glucose: 209 mg/dL

## 2010-05-13 LAB — DIFFERENTIAL
Basophils Relative: 1 % (ref 0–1)
Monocytes Absolute: 0.5 10*3/uL (ref 0.1–1.0)

## 2010-05-13 LAB — PROTIME-INR: INR: 1 (ref 0.00–1.49)

## 2010-05-13 NOTE — Patient Instructions (Signed)
We'll call you with your ortho appt Wear your pretty post-op shoe Aleve for pain and swelling Hang in there!

## 2010-05-13 NOTE — Progress Notes (Signed)
  Subjective:    Patient ID: [name], female    DOB: Nov 24, 1970, 40 y.o.   MRN: [id number]  HPI L ankle pain- missed a step on Sunday.  Uncertain whether ankle rolled in or out- 'i just didn't want to hurt my back again'.  Ankle started swelling yesterday, + pain.  No bruising.  Review of Systems For ROS see HPI     Objective:   Physical Exam  Constitutional: She appears well-developed and well-nourished. No distress.  Musculoskeletal:       Left ankle: She exhibits normal range of motion, no ecchymosis and no deformity. tenderness. No lateral malleolus, no medial malleolus, no head of 5th metatarsal and no proximal fibula tenderness found.       Feet:          Assessment & Plan:

## 2010-05-13 NOTE — Assessment & Plan Note (Signed)
Given location of pain and swelling have concerns for metatarsal fx.  Place in post-op shoe and refer to ortho.

## 2010-05-20 ENCOUNTER — Telehealth: Payer: Self-pay | Admitting: Family Medicine

## 2010-05-20 NOTE — Telephone Encounter (Signed)
Pt called noting that she cannot see ortho due to previous bill dispute. The patient is sure that she paid it, but ortho must investigate. She notes that her foot is not any better and needs referral to alternate ortho. She is aware to await call from [facility] with appt.

## 2010-05-21 NOTE — Telephone Encounter (Signed)
I have now referred patient to [facility], she has appt on 05-22-10, and I have left message for patient to call me back asap for appt info /rh 05-21-10.

## 2010-05-23 IMAGING — CT HEAD^01A_HEAD_SEQUENCE (ADULT)
1 series · 16 of 30 positions shown, 20 images · non-contrast
Comparison: None

CLINICAL DATA: Numbness right side of the face, hand and foot

CT HEAD WITHOUT CONTRAST
TECHNIQUE: Contiguous axial images were obtained from the base of
the skull through the vertex without contrast.

[Series 2: headseq 4.8 h45s · axial · 0.41mm/px · z∈[-74,+59]mm · 16 of 30 slices shown, 20 images]
[im 2/30  brain]
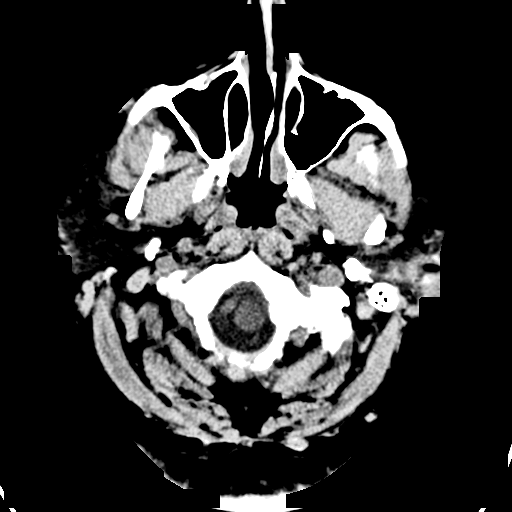
[im 2/30  bone]
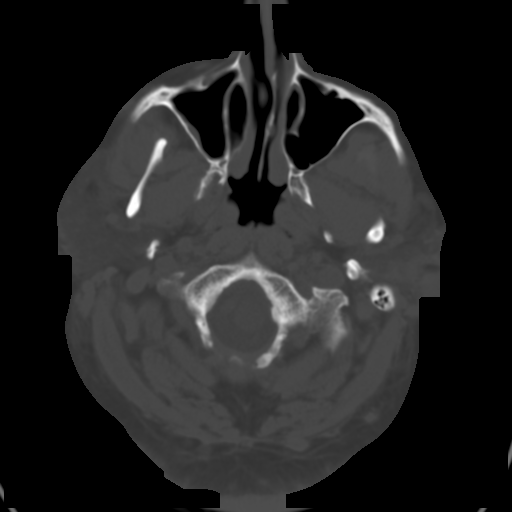
[im 4/30  brain]
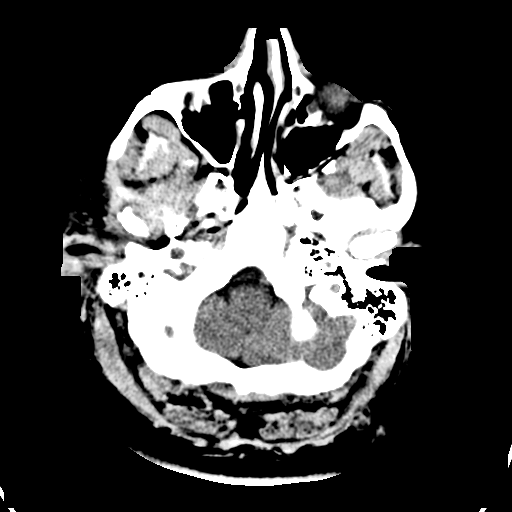
[im 6/30  brain]
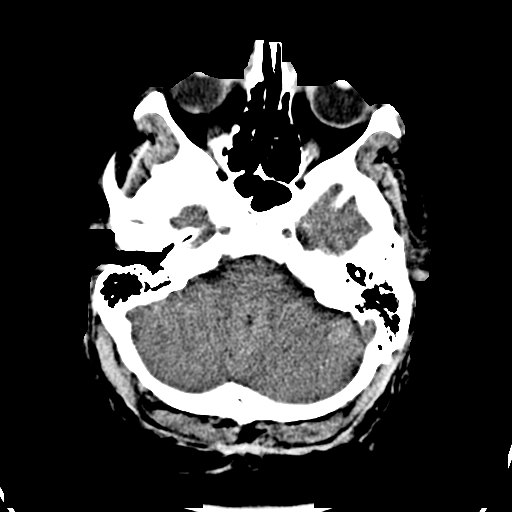
[im 8/30  brain]
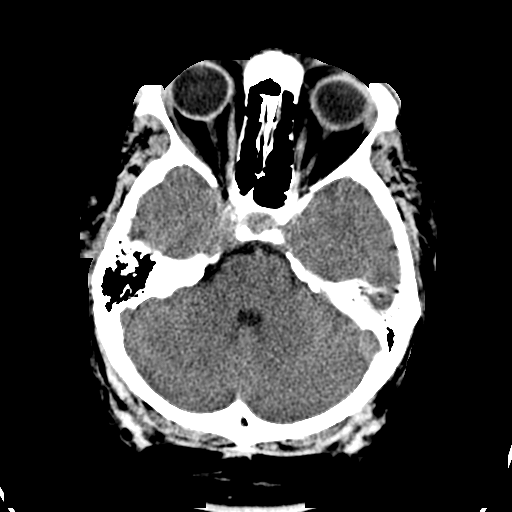
[im 9/30  brain]
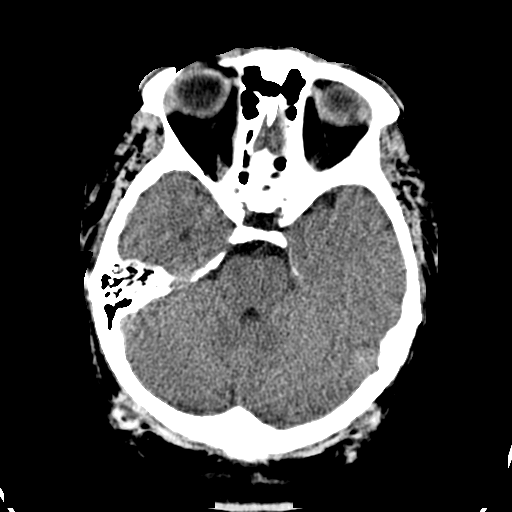
[im 9/30  bone]
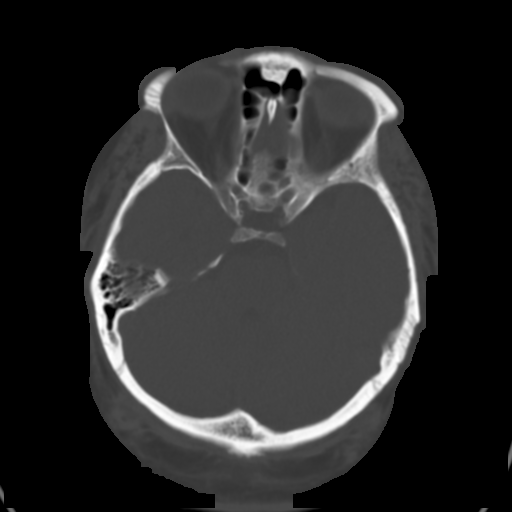
[im 11/30  brain]
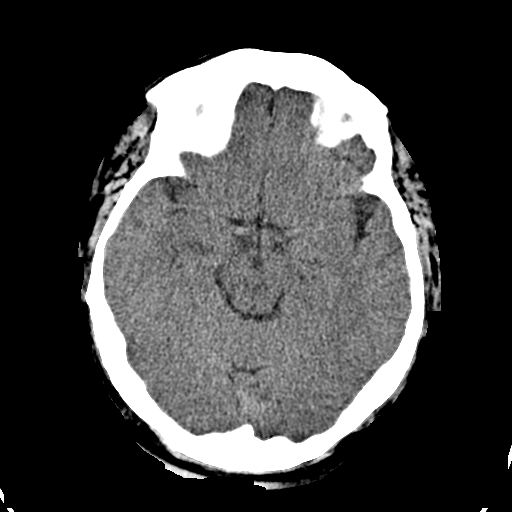
[im 13/30  brain]
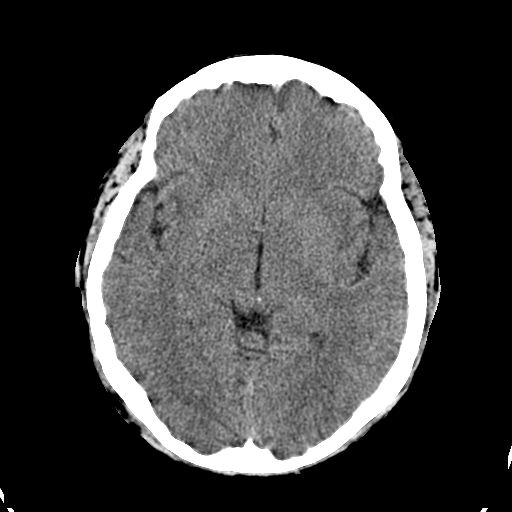
[im 15/30  brain]
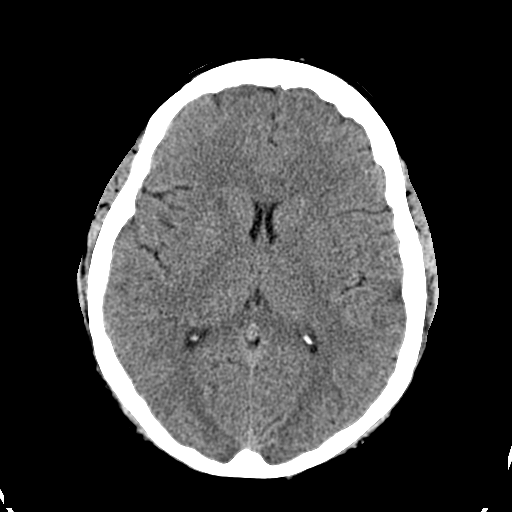
[im 16/30  brain]
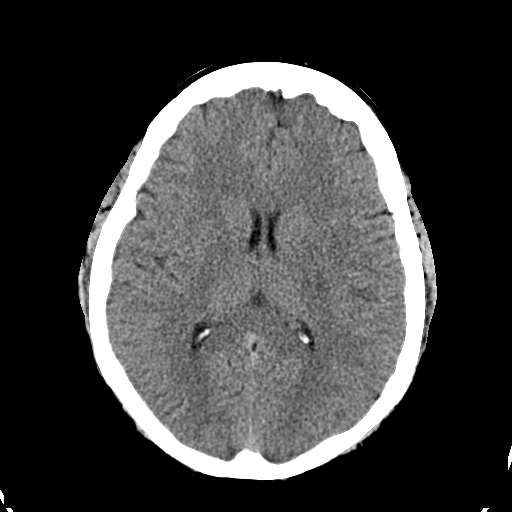
[im 16/30  bone]
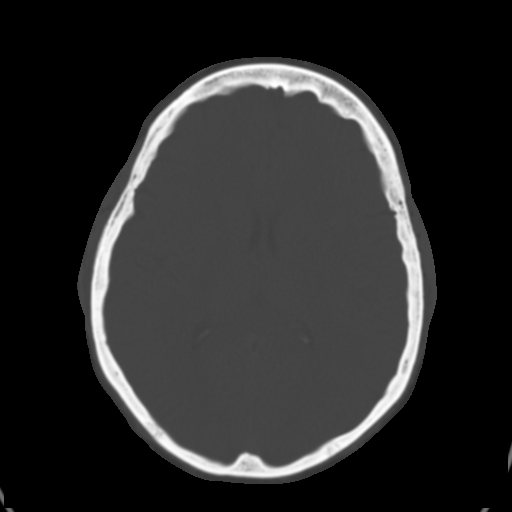
[im 18/30  brain]
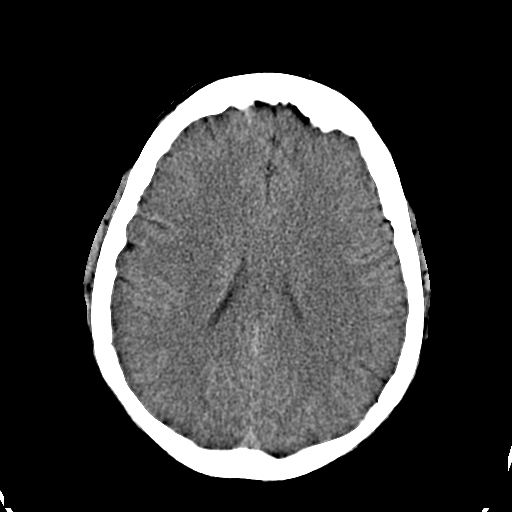
[im 20/30  brain]
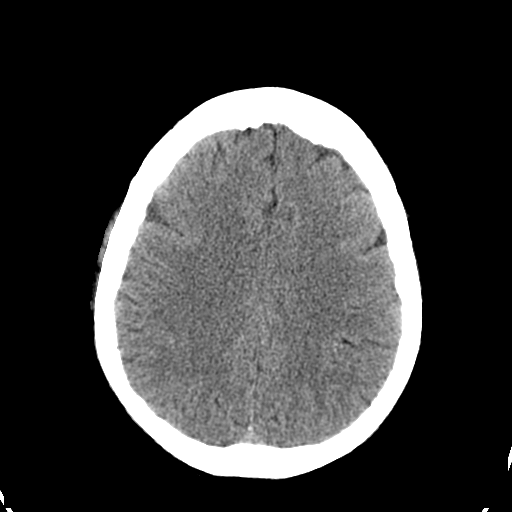
[im 22/30  brain]
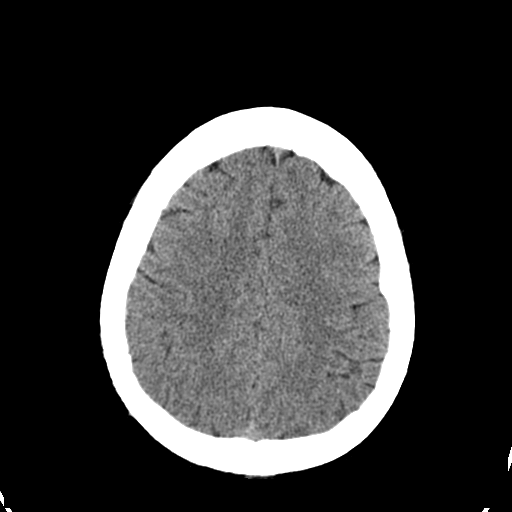
[im 23/30  brain]
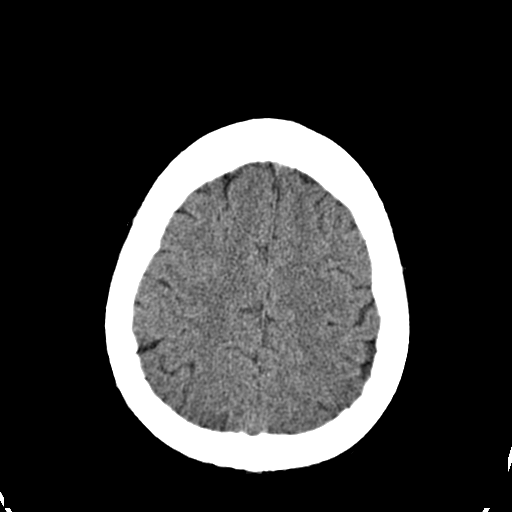
[im 23/30  bone]
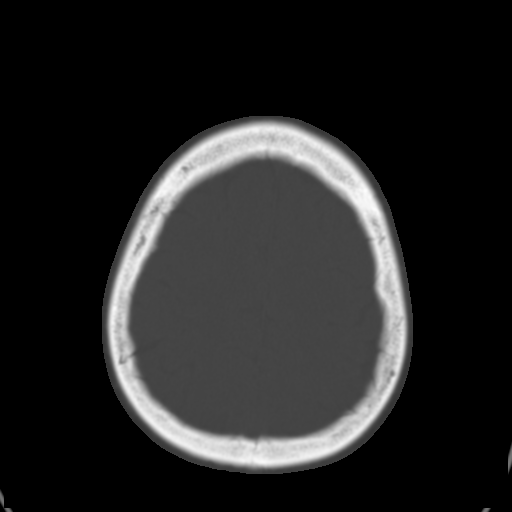
[im 25/30  brain]
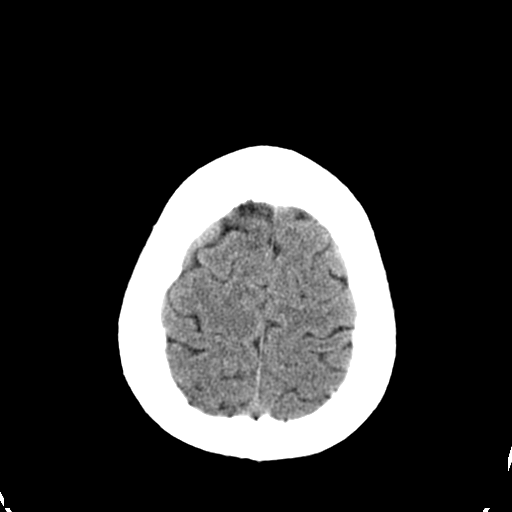
[im 27/30  brain]
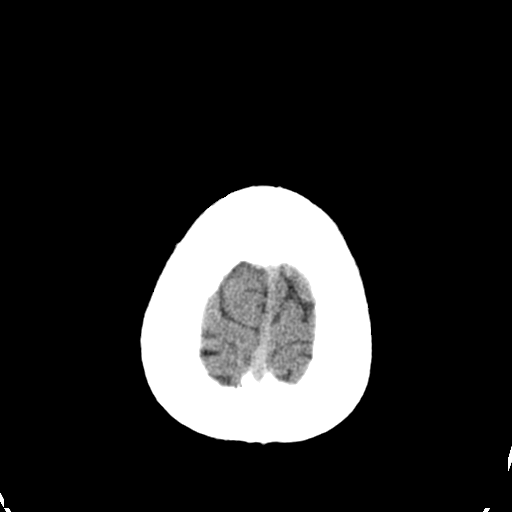
[im 29/30  brain]
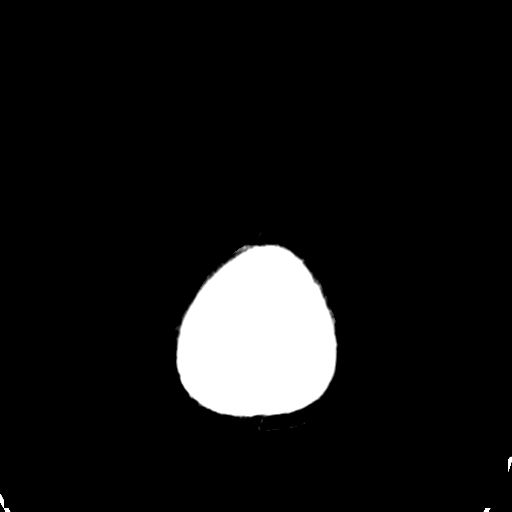

[16 of 30 positions shown; findings below may reference images not displayed]

FINDINGS: The cerebral and cerebellar parenchyma are symmetric and
normal appearance.  No acute intracranial hemorrhage, acute
infarction or mass lesions.  The ventricles and basilar cisterns
are midline.  Mastoid air cells and sinuses are clear.
IMPRESSION: No acute intracranial hemorrhage or edema.

## 2010-05-27 NOTE — Telephone Encounter (Signed)
I confirmed with [name] that pt was notified of the appt. We are not sure why encounter will not close.

## 2010-06-02 ENCOUNTER — Telehealth: Payer: Self-pay | Admitting: *Deleted

## 2010-06-02 MED ORDER — ZOLPIDEM TARTRATE 5 MG PO TABS: 5.0000 mg | ORAL_TABLET | ORAL | Status: DC

## 2010-06-02 NOTE — Telephone Encounter (Signed)
Pt left message on voicemail requesting for sleep stating that she has not been sleeping at all. Please advise.

## 2010-06-02 NOTE — Telephone Encounter (Signed)
Has she tried OTC sleep aid (Tylenol/Advil PM?)  This is the first step prior to prescription meds.

## 2010-06-02 NOTE — Telephone Encounter (Signed)
Ok for Ambien 5 mg nightly as needed.  Should not be used more than 3x/week to avoid body becoming dependent on meds.

## 2010-06-02 NOTE — Telephone Encounter (Signed)
RX sent and pt aware.  

## 2010-06-02 NOTE — Telephone Encounter (Signed)
Pt notes that she did try those meds, and it did not help that night. It only made her sleepy the next day. Pt takes an hour to go to sleep and if she wakes during the night it takes an hour and a half to go back to sleep. Pt is still requesting rx. Please advise.

## 2010-06-17 NOTE — Discharge Summary (Signed)
[name], [name]                 ACCOUNT NO.:  [id number]   MEDICAL RECORD NO.:  [id number]          PATIENT TYPE:  INP   LOCATION:  75                         FACILITY:  [facility]   PHYSICIAN:  Valerie A. Asa Lente, MDDATE OF BIRTH:  15-Apr-1970   DATE OF ADMISSION:  06/09/2008  DATE OF DISCHARGE:  06/14/2008                               DISCHARGE SUMMARY   DISCHARGE DIAGNOSES:  1. Acute left lateral thalamic stroke due to small vessel disease      status post neuro evaluation and further workup.  Continue medical      management as outlined below.  2. Mild right hemiparesis secondary to above.  No active PT needs      recommended.  3. Type 2 diabetes, insulin dependent and suboptimally controlled.      Hemoglobin A1c 8.9.  4. Hypertension.  5. Dyslipidemia.  6. History of depression.  7. Obesity.   DISCHARGE MEDICATIONS:  1. Aspirin 325 mg once daily.  2. Crestor 20 mg daily in place of previously prescribed Zocor due to      co-prescribed TriCor, see below.  3. Amlodipine 10 mg daily (see previously 5 mg daily).   Other medications are as prior to admission include:  1. Metformin 1000 mg q.a.m. plus 500 mg p.o. q.p.m.  2. TriCor 145 mg daily.  3. Lorazepam 0.5 mg t.i.d. p.r.n. anxiety.  4. Clonidine 0.1 mg b.i.d.  5. Benazepril 40 mg daily.  6. Celexa 20 mg daily.  7. NovoLog 35 units subcutaneously t.i.d. a.c. scheduled.  8. Lantus 40 units subcu b.i.d.  9. Naprosyn 500 mg p.o. b.i.d. p.r.n. arthritis pain.   DISPOSITION:  The patient is discharged home where she will be staying  for the time being with her father.  She lives also with her son.   Hospital followup will be with Stroke, Dr. Leonie Man and [facility]  to call for appointment in the next 3 weeks.  Also with primary care  physician, Dr. Raliegh Ip. Tibori.  To call for appointment in the next 2 weeks.   DISPOSITION:  As stated above.   HOSPITAL COURSE:  1. Acute left thalamic stroke.  The patient is a  40 year old woman      with multiple risk factors for atherosclerotic disease who came to      the emergency room on day of admission due to numbness and tingling      around her mouth as well as right side of her body.  Please see H      and P for further details.  She was found to have evidence of an      acute left thalamic stroke on MRI performed in the emergency room      and she was referred for admission for further evaluation and      treatment.  She was not on aspirin or other antiplatelet therapy      prior to admission, so she was begun on aspirin 325 mg daily.  2. Other risk stratification occurred during this hospitalization.      Hemoglobin A1c was suboptimally controlled diabetes,  at 8.9.  A 2-D      echo showed LVEF of 40-50% but no obvious source of embolism.  She      underwent a TEE at [facility] on Jun 15, 2008, which      excluded any possible embolism and had no other abnormalities.      Fasting lipid panel was rechecked and showed good control on her      current medications, but her Zocor was changed to equivalent-dosed      Crestor due to coexisting treatment with TriCor and increased risk      of myopathy and renal failure.  After review of her MRI and MRA,      there was not felt any need to pursue further angiogram but instead      continue aggressive medical management to control risk factors for      further small vessel disease complications.  3. Other medical issues.  The patient's other medical issues are as      listed above.  She is felt stable for discharge home at this time.      This has been discussed with the stroke team as well as the patient      interview.   Over 30 minutes were spent on day of discharge coordination for  discharge planning.      Valerie A. Asa Lente, MD  Electronically Signed     VAL/MEDQ  D:  06/15/2008  T:  06/16/2008  Job:  [id number]

## 2010-06-17 NOTE — Consult Note (Signed)
NAMEMALERY, PELLERITO                 ACCOUNT NO.:  [id number]   MEDICAL RECORD NO.:  [id number]          PATIENT TYPE:  INP   LOCATION:  [place]                         FACILITY:  [facility]   PHYSICIAN:  Princess Bruins. Hickling, M.D.DATE OF BIRTH:  [birth year]   DATE OF CONSULTATION:  06/12/2008  DATE OF DISCHARGE:                                 CONSULTATION   CHIEF COMPLAINT:  Right-sided weakness and numbness.   HISTORY OF THE PRESENT CONDITION:  [name] is a 40 year old separated  woman, mother of one, who has been  morbidly obese with type 2 diabetes  since she was 40 years of age.   The patient experienced onset of numbness in her face and arm beginning  around 10 a.m. May 7.  She did not seek medical care until the next  morning when her symptoms had progressed to involve her entire right  side, and were associated with difficulty ambulating with weakness and  numbness in the right leg.   The patient was brought to [facility] by her father.  She was  evaluated.  MRI scan of the brain showed a small acute to subacute  infarction of the left lateral thalamus.  This could be seen best on  diffusion, but could be seen to a lesser extent on T2 and flare images.  In addition, there was evidence of a fairly large gliotic lesion in the  centrum semiovale on the left side and a much smaller one on the right.  There was also widespread atrophy within the diencephalon manifested by  enlarged venous stasis and prominent sulci that were out of proportion  to the patient's age.   Her risk factors for stroke include type 2 diabetes mellitus that is  insulin-dependent and is in poor control, morbid obesity, hypertension,  hypercholesterolemia, and general anxiety disorder.   The patient has not taken particularly good care of herself and admits  that.  She has never had a stroke.   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   FAMILY HISTORY:  Remarkable for cerebrovascular accident in her father  that began at age 42.  He had to retire awhile ago.   SOCIAL HISTORY:  The patient is going through a difficult divorce and  custody battle.  She is separated from her husband, has custody of her 34-  year-old son.  She denies use of tobacco, alcohol, drugs.  She is a  Sports coach for [employer].  She is a Child psychotherapist.   MEDICATIONS:  1. Metformin 1000 mg in the morning, 500 mg in the evening.  2. Tricor 145 mg daily.  3. Simvastatin 80 mg daily.  4. Clonidine 0.1 mg twice daily.  5. Benazepril 40 mg daily.  6. Citalopram 20 mg daily.  7. Amlodipine 5 mg daily.  8. NovoLog 35 units with meals 3 times daily.  9. Lantus insulin 40 units twice daily.  10.Naproxen 500 mg twice daily.   DRUG ALLERGIES:  None  known.   REVIEW OF SYSTEMS:  Unremarkable for any symptoms except those referable  to the numbness and tingling and weakness that she has on her right  side.  She does not have headache.  She had some nausea initially.  This  has passed.  She has not had any vomiting, diarrhea, no rash, anemia,  bruisability, lymphadenopathy, no signs of systemic intercurrent  infection in head, neck, lungs, GI, GU.  A 12-system review is,  otherwise, negative.   EKG has been carried out and shows a regular sinus rhythm.   OTHER LABORATORY STUDIES:  Hemoglobin A1c is 8.9.  Urine pregnancy test  is negative.  Prothrombin time 13.3, INR 1.0, PTT 25.  CBC with  differential 9000 and white count 9200, hemoglobin 13.6, hematocrit  40.6, MCV 75.6, platelet count 237,000, 69 polys, 20 lymphs, 6 monos, 5  eosinophils, 1 basophil.  Basic metabolic panel:  Sodium Q000111Q, potassium  4.0, chloride 102, CO2 28, glucose has varied from 194-303, BUN 12,  creatinine 9.92.  Magnesium 2.0.  She has not had a lipid panel.  TSH,  vitamin B12, and BMET are pending at this time.   PHYSICAL  EXAMINATION:  Today a pleasant, morbidly obese woman in no  acute distress.  VITAL SIGNS:  Blood pressure 136/85 resting pulse 68, respirations 16,  temperature 97.9, oxygen saturation 99%.  EAR, NOSE, AND THROAT:  No bruit.  Supple neck.  No infection.  LUNGS:  Clear.  HEART:  No murmurs.  Pulses normal.  ABDOMEN:  Soft.  Bowel sounds normal.  No hepatosplenomegaly.  She has a  protuberant abdomen.  EXTREMITIES:  No edema, cyanosis.  Her legs are quite big.  She has no  tenderness or cords.  NEUROLOGIC:  The patient is awake, alert.  NIH stroke scale was 4.  She  has drift of her right leg, decreased sensation on the left, and  extinguishes to double simultaneous stimuli in the left leg.  Modified  Rankin was 0 prior to admission.  No dysphasia, dyspraxia, dysarthria.  Cranial nerves:  Round reactive pupils.  Fundi normal.  Visual fields  full.  Extraocular movements full.  Symmetric facial strength, midline  tongue, air conduction greater than bone conduction, hypesthesia of her  right face.  Motor examination:  She has no drift.  She has 4-/5 hip  flexor, 5/5 elsewhere.  Fine motor movements are slow and clumsy on the  right, normal on the left.  Sensation shows a right hemihypesthesia that  affects the right leg more so than the arm or face.  She has marked  decreased proprioception to the ankle and decreased vibration to the  ankle on the right side in comparison with the left.  Stereoagnosis,  however, was equal.  Finger-to-nose, heel-knee-shin were equal.  Gait  was not tested.  Deep tendon reflexes showed a right reflex predominance  of the right extensor and left flexor plantar response.   IMPRESSION:  1. Subcentimeter left lateral thalamic infarction. (434.01)  2. Risk factors for stroke include morbid obesity, type 2 diabetes      mellitus under poor control, hypertension, and dyslipidemia as well      as positive family history.  I believe that this is a small vessel       infarction.  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  The      anterior cerebral artery fills from the right side.  There is a      right posterior communicating artery.  There is a diverticulum at      the entrance of the left subclavian.  Posterior circulation shows a      dominant right vertebral normal basilar PICA, posterior inferior      cerebellar arteries and right anterior inferior cerebellar artery      is not seen.  Both superior cerebellar arteries and posterior      cerebral arteries are well seen.  The middle cerebral artery,      posterior cerebral artery, and anterior cerebral artery are robust.   I agree that the patient needs a 2-D echocardiogram.  If negative, I  would not persist with a transthoracic echocardiogram at this time.  She  might at some time need a bubble study, but I do not think this was  embolic.  She does not have atrial fibrillation.  I think this is small  vessel disease.  I doubt the need for an angiogram, but I will discuss  both of these with Dr. Leonie Man.  The patient needs aggressive risk factor  modification.  She needs physical and occupational therapy.  She passed  her nurse swallow screen.  She needs rehabilitation consult to consider  the venue for rehabilitation.  I agree with aspirin.  We need to check  her lipid panel.  She needs hypercoagulable panel.  She needs to start  Lovenox 40 units subcutaneously daily for deep venous thrombosis  prophylaxis.  She should be out of bed with assistance only.  We will  not be able to staff this person on the Stroke Service because of her  location at [facility].  We will try as best as we can to make certain  that she is seen by some member of the neurology team daily until her  workup is complete.      Princess Bruins. Gaynell Face, M.D.  Electronically Signed      WHH/MEDQ  D:  06/12/2008  T:  06/12/2008  Job:  [id number]   cc:   Annye Asa, M.D.   Evie Lacks. Plotnikov, MD  [name]  [place] [address]

## 2010-06-17 NOTE — Assessment & Plan Note (Signed)
HEALTHCARE                            CARDIOLOGY OFFICE NOTE   [name], [name]                        MRN:          [id number]  DATE:08/09/2007                            DOB:          1970-10-04    HISTORY OF PRESENT ILLNESS:  The patient is a 40 year old female who I  am asked to evaluate preoperatively prior to left shoulder surgery.  Note, the patient had a nuclear study on August 08, 2003 for chest pain.  At that time, her ejection fraction was 28%.  There was no scar or  ischemia.  She did have a followup echocardiogram on August 08, 2003, and  was felt to be technically difficult.  It was felt that there was  significant wall motion abnormalities and ejection fraction was not  given.  The patient, therefore ultimately underwent cardiac  catheterization on August 09, 2003.  At that time, she was found to have  mild plaque in the LAD, but there was no obstructive disease noted.  The  ejection fraction cannot be evaluated due to ventricular ectopy.  She,  therefore ultimately underwent a MUGA, which showed an ejection fraction  of 51%.  She was treated medically.  However, she is not returned to  this office since then.  She is scheduled for repair of her rotator cuff  on the left, and we are asked to evaluate prior to surgery.  Note, she  denies any dyspnea on exertion, orthopnea, PND, pedal edema,  palpitations, presyncope, syncope, or chest pain.   MEDICATIONS:  1. Zocor 40 mg p.o. daily.  2. Wellbutrin 300 mg p.o. daily.  3. Insulin.  4. Actos 15 mg p.o. daily.  5. Effexor 37.5 mg tablets 2 p.o. daily.  6. Benazepril 40 mg daily.  7. Clonidine 0.2 mg p.o. daily.  8. Metformin 1 gram in the morning and 500 in the evening.  9. TriCor 145 mg p.o. daily.   ALLERGIES:  No known drug allergies.   SOCIAL HISTORY:  She does not smoke nor does she consume alcohol.   FAMILY HISTORY:  Positive for coronary artery disease in her father who  she  states had a myocardial infarction in his 59s.   PAST MEDICAL HISTORY:  Significant for diabetes mellitus, hypertension,  and hyperlipidemia.   CARDIAC HISTORY:  As outlined in the HPI.   She has had a prior hysterectomy as well as an appendectomy.  She has  also had a cyst removed.  She has a history of anxiety and depression by  her report.  There is no other significant past medical history noted.   REVIEW OF SYSTEMS:  She denies any headaches, fevers, or chills.  There  is no productive cough or hemoptysis.  There is no dysphagia,  odynophagia, melena, or hematochezia.  There is no dysuria or hematuria.  No rashes or seizure activity.  There is orthopnea, PND, or pedal edema.  She does not have claudication.  She does have some pain in her feet  from neuropathy.  She also has some pain in her left shoulder.  Remaining systems  are negative.   PHYSICAL EXAMINATION:  VITAL SIGNS:  Blood pressure of 128/87 and the  pulse is 90.  She weighs 239 pounds.  GENERAL:  She is a well-developed and somewhat obese.  She is in no  acute distress at present.  SKIN:  Warm and dry.  She is not appeared to be depressed.  She has no  peripheral clubbing.  BACK:  Normal  HEENT:  Normal eyelids.  NECK:  Supple with normal upstroke bilaterally.  No bruits noted.  There  is no jugular venous distention.  I cannot appreciate thyromegaly.  CHEST:  Clear to auscultation and percussion.  CARDIOVASCULAR:  Regular rhythm.  Normal S1 and S2.  There are no  murmurs, rubs, or gallops noted.  She does have a tattoo over her chest.  ABDOMEN:  Nontender.  Positive bowel sounds.  No hepatosplenomegaly or  no mass appreciated.  There is no abdominal bruit.  EXTREMITIES:  She has 2+ femoral pulses bilaterally.  No bruits.  No  edema.  Palpate no cords.  Her distal pulses are diminished.  NEUROLOGIC:  Grossly intact.   Her electrocardiogram shows a sinus rhythm at a rate of 88.  The axis is  normal.  There  appears to be prior interseptal infarct.  There are  nonspecific ST changes.   DIAGNOSES:  1. Preoperative evaluation prior to shoulder surgery - the patient has      multiple risk factors including diabetes, hypertension,      hyperlipidemia, and family history.  Electrocardiogram also suggest      a prior anterior infarct.  She also has a history of      cardiomyopathy.  This was improved on her followup MUGA, however.      It has been 4 years since her previous evaluation.  I will proceed      with an stress Myoview for risk stratification as well as an      echocardiogram.  If these are normal then we are not pursue further      cardiac evaluation.  2. History of cardiomyopathy - we are repeating her echocardiogram to      reassess her left ventricular function.  3. Abnormal echocardiogram - as per #1, we will proceed with a stress      test.  4. Hypertension - blood pressure adequately controlled on present      medications.  5. Hyperlipidemia - she will continue on her statin, and this is being      followed by Dr. Juventino Slovak.  6. Diabetes mellitus - managed per Dr. Juventino Slovak.   We will see back her on an as-needed basis, pending results for Myoview  and echocardiogram.     Denice Bors. Stanford Breed, MD, [facility]  Electronically Signed    BSC/MedQ  DD: 08/09/2007  DT: 08/09/2007  Job #: [id number]   cc:   Herbie Baltimore A. Noemi Chapel, M.D.  Nelda Severe Juventino Slovak, M.D.

## 2010-06-17 NOTE — H&P (Signed)
NAMEKYLEEN, [name]                 ACCOUNT NO.:  [id number]   MEDICAL RECORD NO.:  [id number]          PATIENT TYPE:  INP   LOCATION:                               FACILITY:  [facility]   PHYSICIAN:  Scarlette Calico, MD       DATE OF BIRTH:  1970-03-24   DATE OF ADMISSION:  06/09/2008  DATE OF DISCHARGE:                              HISTORY & PHYSICAL   HISTORY OF PRESENT ILLNESS:  She developed numbness and tingling around  her mouth and her right upper and lower extremities at 10 a.m. the day  prior to admission.   HISTORY OF PRESENT ILLNESS:  This is a 40 year old obese female with  multiple medical problems who had this steady, but gradual onset of what  she describes as numbness, tingling, and incoordination around her mouth  in her right upper and lower extremities.  She says it developed rather  suddenly and has persisted.  She does not describe her symptoms as being  progressive, steady, or stuttering.  She states she delayed seeking  treatment because she felt the symptoms would dissipate on their own.  She had no warning that the symptoms were going to develop, and that she  was not experiencing any headache, blurred vision, chest pain, shortness  of breath, palpitations, or any preceding paresthesias.  She has been  under quite a bit of stress lately with going through a divorce, but  otherwise there have been no predisposing risk factors.   PAST MEDICAL HISTORY:  Positive for type 2 diabetes mellitus,  hypercholesterolemia, and hypertension.   SOCIAL HISTORY:  She is a Sports coach.  She is going through a divorce.  She denies abuse to tobacco, alcohol, or any illicit drugs.   FAMILY HISTORY:  Remarkable for several CVAs in her father.   CURRENT MEDICATIONS:  1. Metformin 1000 mg in the morning and 500 mg in the evening.  2. TriCor 145 mg once a day.  3. Simvastatin 80 mg once a day.  4. Clonidine 0.1 mg twice a day.  5. Benazepril 40 mg once a day.  6. Citalopram 20 mg once  a day.  7. Amlodipine 5 mg a day.  8. NovoLog 35 units with meals 3 times a day.  9. Lantus 40 units twice a day.  10.Naprosyn 500 mg twice a day.   She has no known drug allergies.   REVIEW OF SYSTEMS:  Negative for any recent fever, cough, shortness of  breath, chest pain, palpitations, edema, diarrhea, rash, or lethargy.   PHYSICAL EXAMINATION:  GENERAL:  An alert, pleasant, and obese female.  VITAL SIGNS:  Her temperature is 98.0, blood pressure is 173/91, pulse  is 85 and regular, respiratory rate is 16 and unlabored, pulse ox 98%,  and it is normal.  HEENT:  No facial asymmetry or cranial nerve defects.  Her pupils  equally react to light and accommodation.  Extraocular movements are  intact with no nystagmus.  Nasopharynx, oropharynx, and posterior  pharynx is normal.  NECK:  Supple with no lymphadenopathy or JVD. No carotid bruits.  LUNGS:  Clear anteriorly and posteriorly.  CARDIOVASCULAR:  Regular rhythm with no murmur, rub, or gallop.  ABDOMEN:  Obese, but there is no hepatosplenomegaly, mass, or tenderness  to palpation.  EXTREMITIES:  No edema.  Pulses are 2+ and equal throughout.  NEUROLOGIC:  She has mild drift in the right upper and right lower  extremities, and a positive Romberg sign.  Her gait is slightly ataxic.  Strength in the right upper and lower extremities 4+/5.  Left upper and  lower extremities 5/5.  Babinski responses are downgoing in both feet.  Cerebellar testing is mildly decreased on the right side compared to the  left.   LABORATORY DATA:  Her CBC is within normal limits.  Her protime is  normal at 13.3.  The urinalysis is unremarkable.  Basic metabolic panel  shows a blood sugar of 199.  The remainder is within normal limits.  She  has a GFR of greater than 60.   IMAGING DATA:  An MRI of the brain shows that she does have an acute  infarct in the left thalamic laterally.  There is mild chronic ischemia  as well.  An MRA of the head and neck  shows that she has several  pathological lesions.  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  There is mild stenosis of the left  common carotid artery proximally.  There is mild stenosis of the  proximal right vertebral artery.  There is left vertebral artery  occlusion within the small distal segment.  Radiologist comments that  this could to be due to a congenital variation of possibly a chronic  vertebral occlusion.  Telemetry here is showing a normal sinus rhythm  with no evidence of atrial fibrillation or atrial flutter.   ASSESSMENT:  1. Acute left thalamic cerebrovascular accident.  She has presented      too late for thrombolytics.  2. Hypertension, which is poorly controlled.  3. Diabetes, which is poorly controlled.   PLAN:  1. The patient has not been taking aspirin or any other antiplatelet      therapy, so she will be started on high dose aspirin at 325 mg once      a day and she will receive subcutaneous Lovenox for additional      anticoagulation as well as for prevention of a deep venous      thrombosis.  2. Her diabetes will be more aggressively managed with an increase in      the dose on the Metformin, an increase in the dose on the Lantus,      and sliding scale regular insulin to cover meals.  3. Continue on her current antihypertensive regimen with the exception      of increased amlodipine from 5 to 10.  I do not want to decrease      the blood pressure too much as it may exacerbate the      cerebrovascular accident.  4. She will be monitored on telemetry for dysrhythmia.  5. She will need a stroke consult since she has some anomalous vessels      noted on the MRA of the neck.  6. Other medications, we will continue without any changes.      Scarlette Calico, MD  Electronically Signed     TJ/MEDQ  D:  06/09/2008  T:  06/10/2008  Job:  [id number]

## 2010-06-20 NOTE — Op Note (Signed)
NAME:  [name], [name]                           ACCOUNT NO.:  [id number]   MEDICAL RECORD NO.:  [id number]                   PATIENT TYPE:  OBV   LOCATION:  9198                                 FACILITY:  [facility]   PHYSICIAN:  Daniel L. Cherylann Banas, M.D.           DATE OF BIRTH:  [birth year]   DATE OF PROCEDURE:  12/12/2001  DATE OF DISCHARGE:                                 OPERATIVE REPORT   PREOPERATIVE DIAGNOSIS:  Right lower quadrant pain, pelvic adhesions  suspected.   POSTOPERATIVE DIAGNOSIS:  Right lower quadrant pain, with pelvic adhesive  disease.   PROCEDURE:  Diagnostic laparoscopy with lysis of pelvic adhesions followed  by right salpingo-oophorectomy.   SURGEON:  Daniel L. Cherylann Banas, M.D.   FIRST ASSISTANT:  Timothy P. Fontaine, M.D.   INDICATIONS:  The patient is a 40 year old gravida 13, para 1, AB7, who came  to see me with a history of persistent right lower quadrant pain.  The pain  always starts at ovulation and gets more severe about one week before her  period and then is even worse during her period.  She has tried oral  contraceptives with no success.Marland Kitchen  She was seen by Dr. Redmond Pulling who was her  internist and by Dr. March Rummage who was the surgeon.  Abdominal CT scan as well a  pelvic CT scan failed to reveal any etiology of the above.  She also had an  ultrasound done which had a very small less than 1.5 cm left ovarian cyst.  She has a history of having had a previous ovarian cystectomy as well as a  previous appendectomy.  She also had a cesarean section.  As a result of  this chronic pain, she now enters the hospital for diagnostic laparoscopy.  If we have significant pelvic adhesive disease on the right ovary which I  suspect we will do a right salpingo-oophorectomy to prevent recurrence.   FINDINGS:  At the time of laparoscopy, the patient had a mobile, normal  uterus.  Her cul-de-sac was free of disease.  There was no endometriosis.  Left fallopian tube was  normal.  The left ovary had a small functional cyst  but otherwise was normal.  There was no adhesive disease on the left at all.  On the right side, there was significant adhesive disease involving the  right tube, right ovary and small bowel.  The small bowel was densely  adherent to the right adnexa.  There were also some adhesions between the  adnexa and the top of the peritoneum.   DESCRIPTION OF PROCEDURE:  After adequate general endotracheal anesthesia,  the patient was placed in the dorsal lithotomy position, prepped and draped  in the usual sterile manner.  A Foley catheter was inserted in the patient's  bladder.  A Hulka catheter was inserted into the patient's uterus.  A  pneumoperitoneum was created with a Voorhees needle.  Then a 10  mm trocar  was placed subumbilically and two 5 mm ports were placed in the pelvis.  The  pelvis was visualized and as noted above, a camera was used for  magnification.  It was felt that the source of the patient's pain was the  adhesive disease and it was decided that she should undergo a right salpingo-  oophorectomy.  Most of the procedure was done with the harmonic scalpel.  Care was taken to stay away from both the ureter and the bowel.  At one  point because the bowel was so densely adherent to the ovary and tube, a  scissor was used without using any harmonic scalpel current.  There were  several areas where bipolar was utilized when we were nowhere near the  bowel.  The right ovary and tube were then completely freed up both from its  blood supply in the uterus and the bowel without injuring the bowel and then  the ovary was cut in small pieces to remove as there was no neoplastic  lesion, and it was removed in pieces through the operating channel of the  scope.  Copious irrigation was done with 1000 cc of Ringer's lactate when  the procedure was done.  All fluid was removed.  There was absolutely no  bleeding at all.  The subumbilical  fascial incision was closed with 0  Vicryl, and the three skin  incisions were closed with Steri-Strips.  Estimated blood loss for the  entire procedure was less than 1000 cc with none replaced.  The patient  tolerated the procedure well and left the operating room in satisfactory  condition draining clear urine from the Foley catheter.                                               Daniel L. Cherylann Banas, M.D.    Lind Guest  D:  12/12/2001  T:  12/12/2001  Job:  [id number]

## 2010-06-20 NOTE — Discharge Summary (Signed)
   NAME:  [name], [name]                           ACCOUNT NO.:  [id number]   MEDICAL RECORD NO.:  [id number]                   PATIENT TYPE:  OBV   LOCATION:  9107                                 FACILITY:  [facility]   PHYSICIAN:  Daniel L. Cherylann Banas, M.D.           DATE OF BIRTH:  26-May-1970   DATE OF ADMISSION:  12/12/2001  DATE OF DISCHARGE:  12/13/2001                                 DISCHARGE SUMMARY   DISCHARGE DIAGNOSES:  Right lower quadrant pain with pelvic adhesive  disease.   PROCEDURE:  Diagnostic laparoscopy with lysis of pelvic adhesions followed  by right salpingo-oophorectomy.   HISTORY OF PRESENT ILLNESS:  The patient is a 40 year old gravida 62, para 1,  AB7, early history of persistent right lower quadrant pain.  Pain starts  always with ovulation, but gets more severe about one week before her  period.  Is even worse during her period.  She was tried on oral  contraceptives.  She has also had abdominal CT scan and pelvic CT which  failed to reveal any etiology.  She has also had a history of previous  ovarian cystectomy as well as previous appendectomy.  She enters the  hospital for diagnostic laparoscopy with the possibility of right salpingo-  oophorectomy to prevent recurrence.   HOSPITAL COURSE:  The patient was admitted where diagnostic laparoscopy,  lysis of pelvic adhesions, followed by ______ oophorectomy was performed by  Dr. Cherylann Banas, assisted by Dr. Phineas Real.  Findings included the patient had  a mobile, normal uterus.  Cul-de-sac was free of disease.  No endometriosis.  Left fallopian tube normal.  Left ovary had a small functional cyst, was  otherwise normal.  No adhesive disease on the left.  On the right there was  significant adhesive disease involving the right tube, right ovary, and  small bowel.  The small bowel was densely adherent to the right adnexa.  There were also some adhesions between the adnexa and the top of the  peritoneum.   Postoperatively patient had some chest discomfort, but no  shortness of breath and no pain otherwise.  Examination of the lungs  revealed some wheezing.  She was encouraged to continue with spirometer use.  CBC:  Hematocrit 34.2, hemoglobin 11.6, WBC 13.2, platelets 261,000.  The  patient is also a diabetic and was noted to have a CBG 328.  She was given  12 units of Regular insulin.  She was able to be discharged in satisfactory  condition on December 13, 2001.     Cyril Mourning . Hancock, N.P.                Daniel L. Cherylann Banas, M.D.    MKH/MEDQ  D:  01/03/2002  T:  01/03/2002  Job:  [id number]

## 2010-06-20 NOTE — Op Note (Signed)
[facility]  Patient:    [name], [name]                           MRN: [id number] Proc. Date: 01/13/00 Adm. Date:  [id number] Disc. Date: [id number] Attending:  Beatrix Fetters                           Operative Report  PREOPERATIVE DIAGNOSIS:       Prolonged rupture of membranes, chorioamnionitis 26-weeks gestation, transverse lie.  SURGEON:                      Frederico Hamman, M.D.  ASSISTANT:                    Margreta Journey, M.D.  ANESTHESIA:                   Spinal.  PROCEDURE:                    Patient placed on the operating table in supine position.  Abdomen prepped and draped.  Bladder emptied with Foley catheter. Transverse suprapubic incision made and carried down to rectus fascia.  The fascia incised the length of the incision.  Recti muscles retracted laterally, peritoneum incised longitudinally.  Transverse incision made in the visceral peritoneum above the bladder and bladder mobilized inferiorly.  The lower segment was developed.  A transverse lower uterine incision made and with the aid of the vacuum, fetus was converted to vertex and with the aid of the vacuum delivered.  She had a female, Apgars 3 and 6 and baby was intubated prior to leaving the operating room.  Placenta was removed manually and sent to pathology.  Uterine cavity cleaned with dry lap.  Uterine incision closed with interlocking suture of #1 chromic in two layers, including myometrium and endometrium.  Bladder flap reattached with 2-0 chromic.  The uterus was well contracted.  Tubes and ovaries normal.  Abdomen closed with ______ fascia ______  2-0 Dexon and the skin closed with subcuticular stitch of 3-0 Monocryl.  ______ . DD:  01/13/00 TD:  01/13/00 Job: [id number]

## 2010-06-20 NOTE — Op Note (Signed)
[name], [name]                 ACCOUNT NO.:  [id number]   MEDICAL RECORD NO.:  [id number]          PATIENT TYPE:  AMB   LOCATION:  [place]                           FACILITY:  [place]   PHYSICIAN:  Frederico Hamman, M.D.DATE OF BIRTH:  1970-10-12   DATE OF PROCEDURE:  08/13/2005  DATE OF DISCHARGE:                                 OPERATIVE REPORT   PREOPERATIVE DIAGNOSIS:  Dysfunctional uterine bleeding.   PROCEDURE:  Hysteroscopy D&C and NovaSure ablation.   Under general anesthesia, perineum and vagina prepped and draped. Bladder  emptied with straight catheter. Bimanual exam revealed uterus to be normal  size. Negative adnexa. Speculum inserted into the vagina. Cervix injected  with 8 cc of 1% Xylocaine, and the cervix curetted, small amount of tissue  obtained. Anterior lip of the cervix grasped with a tenaculum. The  endometrial cavity sounded to 10 cm. Cervical length was 5 cm. Cervix  dilated to #27 Kennon Rounds and the hysteroscope inserted. As noted, she had  endometrial polyp. Sharp curettage was then performed, a large amount of  tissue obtained. NovaSure device inserted. Cavity width was 4 cm. Cavity  integrity was established, and ablation occurred for 1 minute and 10 seconds  at 110 watts. Post ablation, the hysteroscopy was repeated, and the cavity  was totally ablated. Fluid deficit was 30 cc. The patient tolerated the  procedure well and was taken to the recovery room in good condition.           ______________________________  Frederico Hamman, M.D.     BAM/MEDQ  D:  08/13/2005  T:  08/13/2005  Job:  [id number]

## 2010-06-20 NOTE — Discharge Summary (Signed)
[name], [name]                             ACCOUNT NO.:  [id number]   MEDICAL RECORD NO.:  [id number]                   PATIENT TYPE:  INP   LOCATION:  2022                                 FACILITY:  [facility]   PHYSICIAN:  Kirk Ruths, M.D. [facility]            DATE OF BIRTH:  05-Dec-1970   DATE OF ADMISSION:  08/08/2003  DATE OF DISCHARGE:  08/10/2003                                 DISCHARGE SUMMARY   BRIEF HISTORY:  This is a 40 year old female who was referred to [facility] from Dr. Jeanann Lewandowsky for evaluation of chest pain,  palpitations, and dyspnea.  The patient had symptoms for at least two to  three weeks; however, she has felt very fatigued for six to eight months.  She has a strong family history of coronary artery disease.  She had been  experiencing exertional chest pain and dyspnea for several weeks.  She also  noted some palpitations and occasional episodes where her heart would race.  During these episodes, she felt dizzy and nauseated.  She had a Cardiolite  performed in the [place] office on the day of admission that revealed an  ejection fraction of only 28%.  A 2-D echocardiogram was also performed that  revealed a low ejection fraction; however, the final report was pending.  Decision was made to admit the patient for further cardiac evaluation.   ALLERGIES:  No known drug allergies.   PAST MEDICAL HISTORY:  1. Insulin-dependent diabetes mellitus x 15 years.  2. History of hypertension.  3. Elevated lipids, mainly triglycerides.  4. History of depression treated with Zoloft.  5. Gastroesophageal reflux disease.  6. Irritable bowel syndrome.   SOCIAL HISTORY:  The patient is married.  She lives in [place],  [place].  She has a 27-year-old son.  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  She smoked in high school but never heavily.  She uses  alcohol rarely.   FAMILY HISTORY:  Her father is alive in his 11s.  He had coronary  artery  bypass surgery with a previous MI.  His cardiac history began in his 87s.  Her mother is alive at age 63.  There is question of some heart problems  with her mother as well.  She has two sisters, one with kidney trouble.  Both have hypertension.   HOSPITAL COURSE:  As noted, this patient was admitted to [facility]  for further evaluation of chest pain with an abnormal Cardiolite, abnormal  echocardiogram, and abnormal EKG.  The Cardiolite was actually negative for  ischemia, but the ejection fraction was low at 28%.  Decision was made to  proceed with cardiac catheterization.  Her medications were adjusted prior  to the catheterization.   On August 09, 2003, the patient underwent cardiac catheterization performed by  Dr. Percival Spanish.  Please see his complete dictated report for full details.  The  LAD had a mid long 30 to 40% lesion.  There was no other significant  coronary artery disease.  The ejection fraction was difficult to assess  secondary to ectopy.  It was felt the patient would require a MUGA for  quantification of her ejection fraction.  Continued medical therapy and risk  reduction was recommended.  Arrangements were made to discharge the patient  home the following day with plans to titrate the Coreg up on outpatient  basis and plans for a MUGA after the Coreg had been increased.  The patient  would follow up with Dr. Stanford Breed in two to four weeks.   LABORATORY AND X-RAY DATA:  Cardiac enzymes were noted to be negative.  Comprehensive metabolic panel on July 6 was within normal limits except for  a glucose of 147.  BUN was 10, creatinine 0.9, potassium 3.8.  Serum  pregnancy test was negative.  INR was 1.0, PTT 24.  CBC revealed hemoglobin  of 13.3, hematocrit 40.2, WBC 12,000, platelets 323,000.  Hemoglobin A1C was  8.5.  TSH 1.751.   A portable chest x-ray showed no acute disease.   A BNP was 45.   DISCHARGE MEDICATIONS:  1. Coreg 3.125 mg b.i.d.  2.  Zoloft 50 mg daily.  3. TriCor as previously taken.  4. Lotensin 20 mg a.m. and 20 mg p.m.  5. She was not to take her Lotrel any longer.  6. She was to resume her Avandamet two days after her catheterization.  7. She was to resume her insulin as previously taken.  8. She was told to take Tylenol as needed for pain.   DISCHARGE INSTRUCTIONS:  1. The patient was told to avoid any heavy lifting for at least two weeks.  2. She was not to do any strenuous activity for two days.  She was not to     drive for two days.  3. She was to be on a low-salt, low-cholesterol, diabetic diet.  4. She was not to bathe or swim for one week.  5. She was to follow up with Dr. Stanford Breed July 28 at 2:30.  6. She was to follow up with her primary care physician, Dr. Joni Fears and     Dr. Jeanann Lewandowsky, as scheduled or as needed.   PROBLEM LIST AT TIME OF DISCHARGE:  1. Left ventricular dysfunction by office Cardiolite performed August 08, 2003,     with ejection fraction estimated at 28%.  2. Office echocardiogram with preliminary report indicating low ejection     fraction; final report is currently pending.  3. Cardiac catheterization performed August 09, 2003, revealed a 30 to 40% mid     left anterior descending artery lesion but no other significant coronary     disease with ejection fraction being difficult to evaluate.  4. Insulin-dependent diabetes mellitus.  5. Hypertension.  6. Elevated lipids.  7. History of palpitations with occasional tachyarrhythmias associated with     dizziness and nausea which probably needs further evaluation.  8. History of depression.  9. Positive family history of early coronary artery disease.  10.      Gastroesophageal reflux disease and irritable bowel syndrome.   PLAN:  As noted, the patient is to follow up with Dr. Stanford Breed within the  next couple of weeks.  Her Coreg will be titrated upward on an outpatient basis.  She will have a MUGA performed at some point in the  near future for  further evaluation of her ejection fraction. We  will call her at home to be  sure she is on the correct medication.  It appears that Dr. Stanford Breed also  added digoxin 0.125 mg prior to discharge.      Mikey Bussing, P.A. [facility]                  Kirk Ruths, M.D. [facility]    DR/MEDQ  D:  08/10/2003  T:  08/10/2003  Job:  [id number]   cc:   Jeanann Lewandowsky, M.D.  [address] [place] [address]  Fax: [place]. Redmond Pulling, M.D.  [address]  [place]  [place] [address]  Fax: [phone]

## 2010-06-20 NOTE — H&P (Signed)
[facility]  Patient:    [name], [name]                           MRN: [id number] Adm. Date:  [id number] Attending:  Beatrix Fetters                         History and Physical  HISTORY:                      The patient is a 40 year old gravida 5 para 1 abortus 4.  This is a patient of Dr. Alton Revere who delivered by way of cesarean section on December 12 at approximately [redacted] weeks gestation.  Patient indicates that she has been having continuous bleeding since that time.  She was evaluated by Dr. Ruthann Cancer at her six-weeks exam and was placed on progestin pills only.  The patient has continued bleeding and is here for evaluation.  PAST MEDICAL HISTORY:         Significant in that the patient has had a spontaneous abortion x 4, preterm delivery this past infant by way of C section at 24 weeks.  Has a history of hypertension that is followed by Dr. Montez Morita, and diabetes that is followed by Dr. Carlis Abbott.  SURGERIES:                    D&E x 2 in the past and an appendectomy.  SOCIAL HISTORY:               Denies tobacco, alcohol, and also drugs.  PHYSICAL EXAMINATION:  GENERAL:                      Revealed a well-developed, well-nourished female who is somewhat uncomfortable.  HEENT:                        Within normal limits.  NECK:                         Supple.  BREASTS:                      Without masses, tenderness, or discharge.  LUNGS:                        Clear to percussion and auscultation.  HEART:                        Normal sinus rhythm without murmur, rub, or gallop.  ABDOMEN:                      Obese; however, benign.  Surgical scar present.  EXTREMITIES AND NEUROLOGIC:   Within normal limits.  PELVIC:                       Revealed external genitalia and BUSB within normal limits.  The cervix revealed grayish fragments of products of conception at the cervical os.  The adnexa is benign.  The uterus  is approximately 16 to 18 weeks in size, moderate amount of tenderness on palpation.  LABORATORY DATA:              An ultrasound was obtained which revealed products of conception.  PLAN:  The patient was then scheduled for a dilatation and evacuation. DD:  03/12/00 TD:  03/13/00 Job: [id number]

## 2010-06-20 NOTE — Op Note (Signed)
[facility]  Patient:    MOE, ROHLFS                           MRN: [id number] Proc. Date: 03/12/00 Adm. Date:  [id number] Attending:  Beatrix Fetters                           Operative Report  PREOPERATIVE DIAGNOSIS:       Retained products of conception.  POSTOPERATIVE DIAGNOSIS:      Retained products of conception,                               submitted to pathology  OPERATION:                    Dilatation and evacuation.  SURGEON:                      Margreta Journey, M.D.  ANESTHESIA:                   MAC.  ESTIMATED BLOOD LOSS:         200 cc.  COMPLICATIONS:                None.  DISPOSITION:                  The patient tolerated the procedure well and returned to the recovery room in satisfactory condition.  DESCRIPTION OF PROCEDURE:     The patient was taken to the operating room and prepped and draped in the usual fashion for a dilatation and evacuation. The cervical os was already dilated to approximately a size 18 Pratt dilator. With the size 12 suction curet, evacuation of the endometrial cavity was then carried out removing a moderate amount of products of conception. This was done until the uterus was felt to be clean, at which point IV Pitocin was begun. The procedure was then terminated. The patient tolerated the procedure well and returned to recovery room in satisfactory condition.  The patient is to be discharged when fully alert. She has been instructed on the possible complications and care for this type of surgery. She has been told to return to Dr. Ruthann Cancer in two weeks for followup evaluation or to call me prior to that time should any problems arise. DD:  03/12/00 TD:  03/13/00 Job: [id number]

## 2010-06-20 NOTE — Cardiovascular Report (Signed)
NAMEMANJU, [name]                             ACCOUNT NO.:  [id number]   MEDICAL RECORD NO.:  [id number]                   PATIENT TYPE:  INP   LOCATION:  2908                                 FACILITY:  [facility]   PHYSICIAN:  Minus Breeding, M.D.                DATE OF BIRTH:  1970-03-15   DATE OF PROCEDURE:  08/09/2003  DATE OF DISCHARGE:                              CARDIAC CATHETERIZATION   REASON FOR PRESENTATION:  Evaluate the patient with cardiomyopathy (echo  suggested mildly reduced EF by report, the Cardiolite suggested EF was less  than 30%).  Chest pain during Cardiolite but no evidence of ischemia.   PROCEDURE NOTE:  Left heart catheterization performed via the right femoral  artery, right heart catheterization was performed via the right femoral  vein.  Both vessels were cannulated using puncture.  A #6 French arterial  sheath and #8 French venous sheath were inserted via modified Seldinger  technique.  Preformed Judkins and a pigtail catheter were utilized.  The  patient tolerated the procedure well and left the lab in stable condition.   RESULTS:  Hemodynamics:  LV 156/18, AO 152/88, RA mean 14, RV 29/8, PA 31/20  with a mean of 24, pulmonary capillary wedge pressure mean of 14, cardiac  output/cardiac index (thermodilution) 6.4/3.2.   Coronaries:  The left main was normal.  The LAD had mid long 30-40%  stenosis.  There was a first diagonal which was large and branching and  normal.  A second diagonal was large and normal.  A second diagonal was  large and normal.  The circumflex in the AV groove was normal.  There was  small OM1, OM2, and OM3.  There was a large OM4 which was normal.  The right  coronary artery was a large dominant vessel.  It was normal.  There was a  PDA and two posterolaterals.   Left ventriculogram:  The left ventriculogram was obtained in the RAO  projection.  The EF was impossible assess secondary to ventricular ectopy.   CONCLUSION:  Mild LAD  plaque.  Questionable LV function but with a preserved  cardiac output by thermodilution.   PLAN:  The patient will continue to be managed for secondary risk reduction  per Dr. Sherolyn Buba.  She will be managed for probable cardiomyopathy by  Dr. Stanford Breed.  He may reassess the LV function in the future with a MUGA.                                               Minus Breeding, M.D.    [place]  D:  08/09/2003  T:  08/10/2003  Job:  [id number]

## 2010-06-20 NOTE — Op Note (Signed)
[facility]  Patient:    [name], [name]                           MRN: [id number] Proc. Date: 06/05/99 Adm. Date:  [id number] Disc. Date: [id number] Attending:  Lahoma Crocker CC:         [facility]                           Operative Report  PREOPERATIVE DIAGNOSIS:       Embryonic demise.  POSTOPERATIVE DIAGNOSIS:      Embryonic demise.  OPERATION:                    Suction D&C.  SURGEON:                      Agnes Lawrence, M.D.  ASSISTANT:  ANESTHESIA:                   Paracervical block, managed anesthesia care.  COMPLICATIONS:                None.  ESTIMATED BLOOD LOSS:         50 cc.  FINDINGS:                     6 to 8 weeks size anteverted uterus, moderate products of conception.  DESCRIPTION OF PROCEDURE:     The patient was taken to the operating room.  A sterile speculum was placed in the patients vagina.  The cervix and vagina were  swabbed with Betadine and 2 cc of 2% lidocaine were injected into the anterior ip of the cervix.  The single tooth tenaculum was then applied to this location and 4 cc of 2% lidocaine were injected at 4 and 7 oclock to produce a paracervical block.  The cervix was then dilated with [facility] dilators and a 7 mm suction curet was then advanced gently to the uterine fundus.  The suction device was then activated and the curet rotated to clear the uterus of the products of conception. The sharp curettage was then performed until a gritty texture was noted.  The suction curet was then reintroduced to clear the uterus of all the remaining products of conception.  There was minimal bleeding noted and the tenaculum removed with good hemostasis noted.  The patient tolerated the procedure well.  The patient was taken to the PACU in stable condition.  PATHOLOGY:                    Products of conception.  Please note, a sample of the products were sent for genetic analysis. DD:  06/05/99 TD:   06/07/99 Job: [id number]

## 2010-06-20 NOTE — Discharge Summary (Signed)
[name], [name]                             ACCOUNT NO.:  [id number]   MEDICAL RECORD NO.:  [id number]                   PATIENT TYPE:  INP   LOCATION:  [id number]                                 FACILITY:  [facility]   PHYSICIAN:  Jeanann Lewandowsky, M.D.                 DATE OF BIRTH:  06-29-1970   DATE OF ADMISSION:  07/25/2002  DATE OF DISCHARGE:  07/28/2002                                 DISCHARGE SUMMARY   DISCHARGE DIAGNOSES:  1. Insulin-requiring type 2 diabetes mellitus with hyperosmolality.  2. Urinary tract infection.  3. Systemic hypertension.  4. Obesity.  5. Probable polycystic ovarian disease.   HISTORY OF PRESENT ILLNESS:  [name] is a 40 year old Caucasian lady who  has been followed in the office for the last six months with poorly-  controlled diabetes and significant insulin resistance.  The patient has  been diabetic for approximately 13 years.  She was seen in the emergency  room on the day of admission with a complaint of blurred vision, polyuria  and polydipsia, despite being on a sliding-scale regimen of 75/25 Humalog  twice a day.  She has a history of past infected ovarian cyst and was told  that she had hormonal problems related to this cyst and the ovaries.  She  subsequently had removal of her left tube and ovary related to infection.  Other medical problems include despondency, irritable bowel syndrome, GERD  and systemic hypertension.   PERTINENT PHYSICAL FINDINGS:  GENERAL:  She is an obese Caucasian lady  appearing quite anxious, without any respiratory distress and without an  odor of ketoacidemia.  VITAL SIGNS:  Her blood pressure was 165/84, a pulse rate of 84, respiratory  rate of 24 and temperature was 99.  HEENT:  Sclerae were anicteric.  No conjunctival pallor.  Funduscopic exam  revealed no proliferative changes.  She did have a few punctate hemorrhages.  She had no malar erythema.  NECK:  Her neck is supple.  No thyromegaly or jugular venous  distention.  LUNGS:  Her lungs were clear.  CARDIAC:  No murmurs, rubs, or gallops.  ABDOMEN:  She had no abdominal striae.  Her abdomen was obese but soft and  nontender.  SKIN:  There was no hirsutism noted.   PERTINENT LABORATORY DATA:  On admission, she had a normal EKG, a normal  sinus rhythm, no deviation of the electrical axis, no acute repolarization  abnormalities.   Three-way of the abdomen showed no acute abdominal or pulmonary  abnormalities.  She has a normal-sized heart and the lungs were clear.  An  ultrasound of the abdomen revealed no gallstones.  She did have some fatty  changes throughout the liver.   Admission CBC:  White count is normal, hematocrit of 35, MCV is low at 70,  sed rate of 23.  Her comprehensive metabolic panel is normal with the  exception of  a glucose of 205, cortisol of 11, FSH of 3.5, luteinizing  hormone of 5.2, testosterone of 61.8 and a urinalysis was completely normal  except for a glucose that was greater than 1000 mg% and she had many  bacteria.  A urine culture on two occasions revealed 35,000 colonies without  a definitive organism and greater than 100,000 colonies of multiple species  felt to possibly represent a contaminant.   HOSPITAL COURSE:  The patient was admitted with a diagnosis of poorly-  controlled diabetes mellitus without evidence of ketoacidemia, significant  insulin resistance, with a possible history of past polycystic ovarian  disease, without evidence of hyperketosis on this occasion.  She was started  on parenteral IV fluids along with initially a continuous infusion of  insulin until her blood sugars were brought down to the 200 range; at that  point, she was started on Lantus insulin and a fractional schedule of  Humalog to cover blood sugars before meals, at bedtime and 2:30 in the  morning.  On her second hospital day, her fasting capillary sugar was down  to 193, but she showed significant elevations  postprandially.  Her  fractional schedule was adjusted accordingly and she was started on Cipro  for presumed urinary tract infection.   Because of the patient's significant insulin resistance, it was decided that  we would not use Novolog, but instead would use 75/25 before each meal to  provide more aggressive control of her insulin-resistant state, particularly  postprandially.  It was my feeling, and confirmed by her husband, that the  patient was quite noncompliant with diet and ate significantly more calories  than she describes.   DISCHARGE DIET AND MEDICATIONS:  [name], she was discharged home on a  1600-calorie modified-carbohydrate diet, 20 units of Lantus each evening and  in addition to this, a sliding-scale regimen of 75/25 Humalog mix before  each meal; that regimen included a coverage schedule of between 100 to 150 -  - 10 units, 151 to 200 -- 15, 201 to 250 -- 22 units and 251 to 300 -- 30  units, greater than 300 -- 40 units, and she was to notify me of any blood  sugars greater than 300.  There was no coverage for her bedtime and 2:30-in-  the-morning blood sugars.  The patient is requested to resume her Effexor,  Prevacid, Lotrel and Levbid per her primary care physician.   DISCHARGE INSTRUCTIONS:  She is scheduled to be seen in my office in 7 to 10  days and she is requested to discontinue smoking any cigarettes at all.                                                Jeanann Lewandowsky, M.D.    PC/MEDQ  D:  08/31/2002  T:  09/01/2002  Job:  [id number]

## 2010-06-20 NOTE — Discharge Summary (Signed)
[facility]  Patient:    [name], [name]                           MRN: [id number] Adm. Date:  01/13/00 Disc. Date: 01/16/00 Attending:  Frederico Hamman, M.D.                           Discharge Summary                                The patient is a 40 year old gravida 5, para 0-0-4-0 [facility] April 19, 1999.  She is a diabetic on 100 units of Humalog in the a.m. and at lunch time and at dinner time and 100 units of Lantus at bedtime. She has had excellent control throughout the pregnancy.  She is also chronic hypertensive on Aldomet 500 mg p.o. b.i.d. plus Norvasc 10 mg q.d.  Patient was seen on December 11 with ruptured membranes at 26 weeks of gestation.  Her membranes had been leaking since last week Friday.  Her cervix was 2 cm with fetal hands protruding through the cervix.  On admission the ultrasound revealed a transverse lie.  Fetal heart was between 170-180.  Mothers temperature 99 on admission.  She received one dose of betamethasone.  Because of possible chorioamnionitis, it was decided she would be delivered by cesarean section because of transverse lie.  She underwent a primary low transverse cesarean section and had a female Apgar 3 and 6 weighing 2 pounds 4 ounces.  The placenta was removed manually and sent to pathology. Postoperatively the patient did well.  Her blood sugars were slightly elevated and her diabetes was followed by Dr. Carlis Abbott.  He restarted her on 25 of Lantus h.s.  Her blood pressures remained normal while in the hospital.  She was discharged on the third postoperative day to see me on Monday for a blood pressure check.  Hemoglobin was 10.5.  She was discharged home on Tylenol No. 3 one q.4h. p.r.n. for pain.  DISCHARGE DIAGNOSES:          Status post primary low transverse cesarean section at 26 weeks because of prolonged ruptured membranes, possible chorioamnionitis, diabetic and chronic hypertensive patient.DD:   01/16/00 TD:  01/16/00 Job: [id number]

## 2010-06-20 NOTE — H&P (Signed)
[facility]  Patient:    TENNYSON, KIRSCH                           MRN: [id number] Adm. Date:  [id number] Disc. Date: [id number] Attending:  Beatrix Fetters                         History and Physical  HISTORY OF PRESENT ILLNESS:   Patient is a 40 year old gravida 26, para 0-0-4-0 who had all early spontaneous abortions.  Her EDC was April 18, 2000.  She has been a diabetic for a number of years, and during this pregnancy she was well controlled with 100 units of Humalog in the a.m., 100 units at lunch, 100 units at dinner, 100 units at bedtime.  She was followed by Dr. Jeanann Lewandowsky. Her sugars have all been less than 140.  She is also chronic hypertensive and has been on Aldomet 500 mg p.o. b.i.d. plus Norvasc 10 mg q.d. with good control.  The patient was first seen at six weeks gestation and requested _________ agent because of habitual miscarriages.  The risks were explained to her.  She will be given 250 mg IM _________ .  The patient was seen today in the office _________ Friday.  The _________ was positive.  The _________ were palpable from the cervix and she was _________ .  She was not contracting.  She had been on terbutaline 5 mg p.o. q.6 because of some premature contractions.  This was started approximately a week ago. Ultrasound performed today showed transverse lie.  _________ was 99 plus fetal heart rate up to 170-180.  She received one dose of _________ .  The suspicion of chorioamnionitis was cited, but by C-section because of transverse lie.  PHYSICAL EXAMINATION:  GENERAL:                      Revealed well-developed female in no distress.  HEENT:                        Negative.  LUNGS:                        Clear.  HEART:                        Regular rhythm, normal _________ .  ABDOMEN:                      Abdomen is 26 weeks size.  EXTREMITIES:                  Negative. DD:  01/13/00 TD:  01/13/00 Job:  [id number]

## 2010-06-20 NOTE — H&P (Signed)
[name], [name]                             ACCOUNT NO.:  [id number]   MEDICAL RECORD NO.:  [id number]                   PATIENT TYPE:  INP   LOCATION:  2908                                 FACILITY:  [facility]   PHYSICIAN:  Kirk Ruths, M.D. [facility]            DATE OF BIRTH:  1970/11/19   DATE OF ADMISSION:  08/08/2003  DATE OF DISCHARGE:                                HISTORY & PHYSICAL   CHIEF COMPLAINT:  Chest pain and dyspnea.   HISTORY OF PRESENT ILLNESS:  This is a pleasant 40 year old female referred  by Dr. Carlis Abbott for evaluation of chest pain, palpitations, and dyspnea.  The  patient has had symptoms for at least two to three weeks, however, she has  felt very fatigued for six to eight months.  She has a strong family history  of coronary artery disease.  She has been experiencing exertional chest pain  and dyspnea over the past several weeks.  She has also noted some  palpitations and occasional episodes where her heart races.  During these  episodes she feels dizzy and nauseated.  She had a Cardiolite performed in  the [place] office today that showed no ischemia but her ejection fraction  was only 28%.  A 2-D echo was also performed that showed a low ejection  fraction, however, the final report is pending.  She was seen by myself and  Dr. Stanford Breed in the office and arrangements are being made to admit the  patient to [place] for cardiac catheterization to be  performed tomorrow.   ALLERGIES:  No known drug allergies.   MEDICATIONS:  1. Insulin Humalog 70/30 taken t.i.d. on a sliding scale basis, averaging     approximately 40 to 60 units per dose.  2. Zoloft 50 mg q.a.m.  3. Lotrel 10/20 q.a.m.  4. Avandamet one b.i.d. dosage not available.  5. Tricor one daily, dosage not available.   PAST MEDICAL HISTORY:  1. Insulin-dependent diabetes mellitus x15 years.  2. History of hypertension.  3. Elevated lipids, mainly elevated triglycerides.  4.  History of depression treated with Zoloft.  5. Gastroesophageal reflux disease.  6. Irritable bowel syndrome.   SOCIAL HISTORY:  The patient is married.  She lives in [place],  [place].  She has a 80-year-old son.  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  She smoked in high school but never heavily.  She uses  alcohol rarely.   FAMILY HISTORY:  Her father is alive in his 8s.  He had coronary artery  bypass graft surgery and previous MI.  His cardiac history started in his  44s.  Her mother is alive at age 76. There is a question of some heart  trouble with her mother as well.  She has two sisters, one with kidney  trouble. Both have hypertension.   REVIEW OF SYMPTOMS:  Significant for the above  noted symptoms as well as  recent dizziness, palpitations.  She wears glasses for reading and driving.  She has two pillow orthopnea.  She has paroxysmal nocturnal dyspnea as well  as dyspnea on exertion.  She has occasional dysphagia.  She has a tattoo on  her breast.  She had a colonoscopy with polyps one and a half years ago.  She had a bladder infection six months ago.  Her last mensis was July 30, 2003.  She has been a diabetic for 15 years.  Her weight has been stable.  She has occasional leg cramps.   PHYSICAL EXAMINATION:  GENERAL APPEARANCE:  A somewhat depressed appearing  40 year old white female in no acute distress.  VITAL SIGNS:  Blood pressure 160/100, pulse 72.  HEENT:  Unremarkable.  NECK:  No bruits, no jugular venous distension.  LUNGS:  Clear but slightly decreased.  CARDIOVASCULAR:  Regular rate and rhythm without murmur.  ABDOMEN:  Obese, soft and nontender.  EXTREMITIES:  Trace edema.  Pulses are intact.  SKIN:  Warm and dry.   LABORATORY DATA:  EKG shows normal sinus rhythm, rate 81 beats per minute  with a question of an old anterior MI.  Other labs are pending.   IMPRESSION:  1. Recent dyspnea and chest pain with a Cardiolite today  showing no     ischemia, ejection fraction 28% and echo showing a low ejection fraction     with final results pending.  2. Recent palpitations, question arrhythmia associated with dizziness and     nausea.  3. Abnormal EKG question previous myocardial infarction.  4. Diabetes mellitus.  5. Hypertension.  6. Elevated lipids.  7. Depression.  8. Positive family history of coronary artery disease.  9. Gastroesophageal reflux disease and irritable bowel syndrome.   PLAN:  As noted, the patient will be admitted to [facility] today for adjustment of her medication, further monitoring and  plans for cardiac catheterization tomorrow.  We will increase her Lotensin.  We  will add Coreg.  We will add aspirin.  We will give her Lasix and a statin  medication.  She may need an evaluation from the EP physicians.  We will  also plan on checking a BNP, TSH, enzymes and she will be treated with IV  heparin per pharmacy and IV nitroglycerin.  As noted, cardiac  catheterization will be performed tomorrow.      Mikey Bussing, P.A. [facility]                  Kirk Ruths, M.D. [facility]    DR/MEDQ  D:  08/08/2003  T:  08/08/2003  Job:  [id number]   cc:   Luna Kitchens. Redmond Pulling, M.D.  [address]  [place]  [place] [address]  Fax: [phone]   Jeanann Lewandowsky, M.D.  [address], [place]  [place] [address]  Fax: [phone]

## 2010-06-23 ENCOUNTER — Ambulatory Visit: Payer: BC Managed Care – PPO | Admitting: Family Medicine

## 2010-06-25 ENCOUNTER — Ambulatory Visit: Payer: BC Managed Care – PPO | Admitting: Family Medicine

## 2010-07-24 ENCOUNTER — Ambulatory Visit (INDEPENDENT_AMBULATORY_CARE_PROVIDER_SITE_OTHER): Payer: BC Managed Care – PPO | Admitting: Family Medicine

## 2010-07-24 VITALS — BP 130/66 | Temp 98.8°F | Wt 184.2 lb

## 2010-07-24 DIAGNOSIS — G4700 Insomnia, unspecified: Secondary | ICD-10-CM

## 2010-07-24 DIAGNOSIS — E119 Type 2 diabetes mellitus without complications: Secondary | ICD-10-CM

## 2010-07-24 DIAGNOSIS — R51 Headache: Secondary | ICD-10-CM

## 2010-07-24 DIAGNOSIS — R519 Headache, unspecified: Secondary | ICD-10-CM

## 2010-07-24 LAB — BASIC METABOLIC PANEL
CO2: 26 mEq/L (ref 19–32)
Chloride: 103 mEq/L (ref 96–112)
Creatinine, Ser: 0.9 mg/dL (ref 0.4–1.2)
Glucose, Bld: 202 mg/dL — ABNORMAL HIGH (ref 70–99)

## 2010-07-24 LAB — HEMOGLOBIN A1C: Hgb A1c MFr Bld: 7.5 % — ABNORMAL HIGH (ref 4.6–6.5)

## 2010-07-24 MED ORDER — TEMAZEPAM 15 MG PO CAPS: 15.0000 mg | ORAL_CAPSULE | Freq: Every evening | ORAL | Status: DC | PRN

## 2010-07-24 NOTE — Patient Instructions (Signed)
Schedule a follow up in 3 months to recheck diabetes and cholesterol- do not eat before this appt Your headaches are most likely allergy related.  Start the nasal spray- 2 sprays each nostril daily Start Claritin or Zyrtec daily Start the Temazepam nightly for sleep Call with any questions or concerns Have a great summer!

## 2010-07-24 NOTE — Progress Notes (Signed)
  Subjective:    Patient ID: [name], female    DOB: Mar 05, 1970, 40 y.o.   MRN: [id number]  HPI DM- reports CBG this AM was 111.  Yesterday sugar was high- asking for Novolog samples to use in these situations.  CBGs tend to depend on dietary intake.  Denies symptomatic lows.  No CP, SOB, edema, N/V/D.  HAs- sxs started 2 weeks ago, ~3 days/week.  Took aleve today w/out relief.  HAs are frontal.  No visual changes.  Denies photo or phonophobia.  Improve w/ sleep.  Don't correspond to particular time of day.  Today woke up w/ HA.  Denies nasal congestion but increased sneezing.  Denies facial pressure.  Insomnia- Ambien will help her go to sleep but it doesn't keep her asleep and 'i do crazy things when i'm on it'.  Took 1/2 a pill w/out good results.  Would like a different med if possible.  Son requesting her not to take it.   Review of Systems For ROS see HPI     Objective:   Physical Exam  Constitutional: She is oriented to person, place, and time. She appears well-developed and well-nourished. No distress.  HENT:  Head: Normocephalic and atraumatic.  Mouth/Throat: Oropharynx is clear and moist.       + edematous turbinates Mild discomfort w/ palpation of sinuses TMs normal bilaterally  Eyes: Conjunctivae and EOM are normal. Pupils are equal, round, and reactive to light.  Neck: Normal range of motion. Neck supple. No thyromegaly present.  Cardiovascular: Normal rate, regular rhythm, normal heart sounds and intact distal pulses.   Pulmonary/Chest: Effort normal and breath sounds normal. No respiratory distress. She has no wheezes. She has no rales.  Musculoskeletal: She exhibits no edema.  Lymphadenopathy:    She has no cervical adenopathy.  Neurological: She is alert and oriented to person, place, and time. No cranial nerve deficit.  Skin: Skin is warm and dry.  Psychiatric: She has a normal mood and affect. Her behavior is normal.          Assessment & Plan:

## 2010-07-25 ENCOUNTER — Encounter: Payer: Self-pay | Admitting: *Deleted

## 2010-08-03 ENCOUNTER — Encounter: Payer: Self-pay | Admitting: Family Medicine

## 2010-08-03 NOTE — Assessment & Plan Note (Signed)
Pt's frequent HAs are most likely sinus related and caused by untreated nasal allergies.  Start OTC anti-histamine.  Add steroid nasal spray.  Reviewed supportive care and red flags that should prompt return.  Pt expressed understanding and is in agreement w/ plan.

## 2010-08-03 NOTE — Assessment & Plan Note (Signed)
Stop ambien as pt is having severe side effects.  Start Temazepam prn for sleep.

## 2010-08-03 NOTE — Assessment & Plan Note (Signed)
Pt has not been following ADA diet.  Would like insulin to use for the times her sugar is high (has done sliding scale previously).  Stressed importance of healthy diet and exercise once she is able (currently has a broken knee cap).  Will check labs and make adjustments prn.

## 2010-08-13 ENCOUNTER — Telehealth: Payer: Self-pay | Admitting: *Deleted

## 2010-08-13 NOTE — Telephone Encounter (Signed)
Pt was requesting Novolog and Nasonex. Pt was advised that we do not have any Novolog, but will place 1 sample of Nasonex up front for pick up.

## 2010-08-29 ENCOUNTER — Other Ambulatory Visit: Payer: Self-pay | Admitting: Family Medicine

## 2010-08-29 DIAGNOSIS — Z1231 Encounter for screening mammogram for malignant neoplasm of breast: Secondary | ICD-10-CM

## 2010-09-22 ENCOUNTER — Ambulatory Visit: Payer: BC Managed Care – PPO

## 2010-09-22 ENCOUNTER — Ambulatory Visit
Admission: RE | Admit: 2010-09-22 | Discharge: 2010-09-22 | Disposition: A | Discharge status: Home / Self Care | Payer: BC Managed Care – PPO | Source: Ambulatory Visit | Attending: Family Medicine | Admitting: Family Medicine

## 2010-09-22 DIAGNOSIS — Z1231 Encounter for screening mammogram for malignant neoplasm of breast: Secondary | ICD-10-CM

## 2010-10-27 ENCOUNTER — Ambulatory Visit (INDEPENDENT_AMBULATORY_CARE_PROVIDER_SITE_OTHER): Payer: BC Managed Care – PPO | Admitting: Family Medicine

## 2010-10-27 ENCOUNTER — Encounter: Payer: Self-pay | Admitting: Family Medicine

## 2010-10-27 DIAGNOSIS — E785 Hyperlipidemia, unspecified: Secondary | ICD-10-CM

## 2010-10-27 DIAGNOSIS — E119 Type 2 diabetes mellitus without complications: Secondary | ICD-10-CM

## 2010-10-27 DIAGNOSIS — J019 Acute sinusitis, unspecified: Secondary | ICD-10-CM

## 2010-10-27 LAB — BASIC METABOLIC PANEL
BUN: 13 mg/dL (ref 6–23)
Creatinine, Ser: 1 mg/dL (ref 0.4–1.2)
GFR: 62.46 mL/min (ref >60.00)
Glucose, Bld: 155 mg/dL — ABNORMAL HIGH (ref 70–99)
Potassium: 5.3 mEq/L — ABNORMAL HIGH (ref 3.5–5.1)

## 2010-10-27 LAB — LIPID PANEL
Cholesterol: 158 mg/dL (ref 0–200)
Triglycerides: 231 mg/dL — ABNORMAL HIGH (ref 0.0–149.0)
VLDL: 46.2 mg/dL — ABNORMAL HIGH (ref 0.0–40.0)

## 2010-10-27 LAB — HEPATIC FUNCTION PANEL
ALT: 25 U/L (ref 0–35)
AST: 30 U/L (ref 0–37)
Albumin: 4.5 g/dL (ref 3.5–5.2)

## 2010-10-27 MED ORDER — BENZONATATE 200 MG PO CAPS: 200.0000 mg | ORAL_CAPSULE | Freq: Three times a day (TID) | ORAL | Status: AC | PRN

## 2010-10-27 MED ORDER — CLARITHROMYCIN ER 500 MG PO TB24: 1000.0000 mg | ORAL_TABLET | Freq: Every day | ORAL | Status: AC

## 2010-10-27 NOTE — Patient Instructions (Signed)
Follow up in 3 months to recheck diabetes We'll notify you of your lab results and make any med changes if needed You have a sinus infection- take the Biaxin as directed Use the cough pills as needed Take mucinex to thin your congestion Call with any questions or concerns Hang in there!!!

## 2010-10-27 NOTE — Assessment & Plan Note (Signed)
Start abx.  Reviewed supportive care and red flags that should prompt return.  Pt expressed understanding and is in agreement w/ plan.

## 2010-10-27 NOTE — Progress Notes (Signed)
  Subjective:    Patient ID: [name], female    DOB: [birth year], 40 y.o.   MRN: [id number]  HPI DM- chronic problem, s/p gastric bypass which has improved control.  On Metformin, Novolog as needed.  Pt reports CBGs are 'so-so'.  Pt has stopped low carb diet but plans on restarting.  No CP, SOB, HAs, visual changes, edema, symptomatic lows.  Hyperlipidemia- chronic problem, on fenofibrate but not statin.  Denies abd pain, N/V, myalgias.  URI- sxs started Thursday w/ vomiting and diarrhea.  Felt better on Friday.  Saturday 'could barely swallow' due to sore throat.  + chills, sweats, bilateral ear fullness, nasal congestion, facial pressure, body aches.  + cough w/ chest discomfort.  Review of Systems For ROS see HPI     Objective:   Physical Exam  Constitutional: She appears well-developed and well-nourished. No distress.  HENT:  Head: Normocephalic and atraumatic.  Right Ear: Tympanic membrane normal.  Left Ear: Tympanic membrane normal.  Nose: Mucosal edema and rhinorrhea present. Right sinus exhibits maxillary sinus tenderness and frontal sinus tenderness. Left sinus exhibits maxillary sinus tenderness and frontal sinus tenderness.  Mouth/Throat: Uvula is midline and mucous membranes are normal. Posterior oropharyngeal erythema present. No oropharyngeal exudate.  Eyes: Conjunctivae and EOM are normal. Pupils are equal, round, and reactive to light.  Neck: Normal range of motion. Neck supple.  Cardiovascular: Normal rate, regular rhythm, normal heart sounds and intact distal pulses.   Pulmonary/Chest: Effort normal and breath sounds normal. No respiratory distress. She has no wheezes.  Abdominal: Soft. Bowel sounds are normal. She exhibits no distension. There is no tenderness. There is no rebound.  Musculoskeletal: She exhibits no edema and no tenderness.  Lymphadenopathy:    She has no cervical adenopathy.  Skin: Skin is warm and dry.          Assessment & Plan:

## 2010-10-27 NOTE — Assessment & Plan Note (Signed)
Chronic problem.  Pt not currently following ADA diet. Encouraged her to do so.  Check labs.  Adjust meds prn.

## 2010-10-27 NOTE — Assessment & Plan Note (Signed)
Chronic problem for pt.  Tolerating fenofibrate w/out difficulty.  Check labs.  Add statin if needed to get LDL goal of 70.

## 2010-10-28 ENCOUNTER — Telehealth: Payer: Self-pay

## 2010-10-28 MED ORDER — SIMVASTATIN 10 MG PO TABS: 10.0000 mg | ORAL_TABLET | Freq: Every evening | ORAL | Status: DC

## 2010-10-28 NOTE — Telephone Encounter (Signed)
Message copied by Santiago Bumpers on Tue Oct 28, 2010  8:45 AM ------      Message from: Midge Minium      Created: Mon Oct 27, 2010  5:02 PM       A1C is stable- need to work on low carb diet.            Trigs are elevated again- needs to make sure she's taking the Fenofibrate daily.  Should also add Simvastatin 10mg  nightly to reduce her risk

## 2010-10-28 NOTE — Telephone Encounter (Signed)
Pt aware and Rx sent to pharmacy

## 2010-10-29 ENCOUNTER — Telehealth: Payer: Self-pay

## 2010-10-29 NOTE — Telephone Encounter (Signed)
Incoming fax for clarification on filling both simvastatin and biaxin due to a drug interaction.   Per Dr. Birdie Riddle, it is okay for pharmacy to fill both. Pharmacy notified

## 2010-12-17 ENCOUNTER — Telehealth: Payer: Self-pay | Admitting: *Deleted

## 2010-12-17 ENCOUNTER — Observation Stay: Payer: Self-pay | Admitting: Student

## 2010-12-17 NOTE — Telephone Encounter (Signed)
Pt c/o sob and pain/pressure in chest on and off for about 3 weeks. Pt states that it just feels funny in her chest just cant explain the feeling. Pt indicated that this feeling has gotten progressively worse throughout the week. Pt denies any increase in gas,burping, numbness, or tingling. Pt advise ED for evaluation

## 2010-12-17 NOTE — Telephone Encounter (Signed)
Pt return call and is currently in ED now for evaluation.

## 2010-12-17 NOTE — Telephone Encounter (Signed)
Left message to call office to verify the Pt did go to ED as previously instructed.

## 2010-12-17 NOTE — Telephone Encounter (Signed)
Must go to ER

## 2010-12-18 DIAGNOSIS — R072 Precordial pain: Secondary | ICD-10-CM

## 2010-12-19 ENCOUNTER — Other Ambulatory Visit: Payer: Self-pay | Admitting: Family Medicine

## 2010-12-22 ENCOUNTER — Encounter: Payer: Self-pay | Admitting: Family Medicine

## 2010-12-22 ENCOUNTER — Ambulatory Visit (INDEPENDENT_AMBULATORY_CARE_PROVIDER_SITE_OTHER): Payer: BC Managed Care – PPO | Admitting: Family Medicine

## 2010-12-22 DIAGNOSIS — R079 Chest pain, unspecified: Secondary | ICD-10-CM

## 2010-12-22 DIAGNOSIS — E119 Type 2 diabetes mellitus without complications: Secondary | ICD-10-CM

## 2010-12-22 MED ORDER — PROMETHAZINE HCL 25 MG PO TABS: 25.0000 mg | ORAL_TABLET | Freq: Four times a day (QID) | ORAL | Status: DC | PRN

## 2010-12-22 NOTE — Progress Notes (Signed)
  Subjective:    Patient ID: [name], female    DOB: [birth year], 40 y.o.   MRN: [id number]  HPI Chest pain- pt called on 11/14 w/ CP.  Directed to ER.  Went to [employer].  Was admitted for r/o.  Saw [place] cards while inpt.  No cardiac cause identified during hospital stay.  DM- during hospitalization, meds were adjusted.  Glipizide was added.  Pt reports she had hypoglycemic event in hospital but 'all they did was give me 2 cups of orange juice'.  Since D/C has felt dizzy, shaky, having diarrhea, feeling weak.  Yesterday sugar was 49.  Ate something- it rebounded to 80s but again dropped later.  Not eating due to nausea.  Review of Systems For ROS see HPI     Objective:   Physical Exam  Vitals reviewed. Constitutional: She is oriented to person, place, and time. She appears well-developed and well-nourished. No distress.       Pale  HENT:  Head: Normocephalic and atraumatic.  Eyes: Conjunctivae and EOM are normal. Pupils are equal, round, and reactive to light.  Neck: Normal range of motion. Neck supple. No thyromegaly present.  Cardiovascular: Normal rate, regular rhythm, normal heart sounds and intact distal pulses.   No murmur heard. Pulmonary/Chest: Effort normal and breath sounds normal. No respiratory distress.  Abdominal: Soft. She exhibits no distension. There is no tenderness.  Musculoskeletal: She exhibits no edema.  Lymphadenopathy:    She has no cervical adenopathy.  Neurological: She is alert and oriented to person, place, and time.  Skin: Skin is warm and dry.  Psychiatric: She has a normal mood and affect. Her behavior is normal.          Assessment & Plan:

## 2010-12-22 NOTE — Telephone Encounter (Signed)
Noted MD sent temazepam via e script

## 2010-12-22 NOTE — Telephone Encounter (Signed)
Last OV 10-27-10 last refill 10-18-10

## 2010-12-22 NOTE — Patient Instructions (Signed)
Follow up in 1 week to recheck sugars STOP the Glipizide Decrease Metformin to daily (if it's extended release) Continue the Omeprazole and the aspirin Make sure you are eating regularly Take the phenergan as needed for nausea Call with any questions or concerns Hang in there!!!

## 2010-12-24 ENCOUNTER — Telehealth: Payer: Self-pay | Admitting: *Deleted

## 2010-12-24 MED ORDER — METFORMIN HCL 1000 MG PO TABS: 1000.0000 mg | ORAL_TABLET | Freq: Two times a day (BID) | ORAL | Status: DC

## 2010-12-24 NOTE — Telephone Encounter (Signed)
Pt called to advise that MD tabori wanted clarification about her Metformin to verify that she is NOT on the extended release for Metformin as her chart states. Advised MD, was verbally advised to change to correct Metformin in chart and to advise pt to continue with medication as prescribed take 1000mg  two times daily with meals. Marland Kitchenleft message to have patient return my call for pt to verify what her medication bottle reads.

## 2010-12-30 NOTE — Assessment & Plan Note (Signed)
Deteriorated.  Pt is now having severe hypoglycemic episodes since addition of glipizide.  Will stop med.  Continue metformin.  Stressed the importance of eating regularly despite nausea to prevent additional episodes of hypoglycemia.  Script for phenergan given prn.  Will follow closely.  Pt expressed understanding and is in agreement w/ plan.

## 2010-12-30 NOTE — Assessment & Plan Note (Signed)
New.  Pt admitted to [facility] and ruled out for MI.  Uncertain as to whether she needs to f/u w/ cards- will need to get records from hospitalization.  Pt was started on PPI at d/c so it appears the thought is that it's GERD.  Will review records once available and refer to cards prn.  Pt expressed understanding and is in agreement w/ plan.

## 2011-01-02 ENCOUNTER — Ambulatory Visit (INDEPENDENT_AMBULATORY_CARE_PROVIDER_SITE_OTHER): Payer: BC Managed Care – PPO | Admitting: Family Medicine

## 2011-01-02 ENCOUNTER — Encounter: Payer: Self-pay | Admitting: Family Medicine

## 2011-01-02 DIAGNOSIS — F329 Major depressive disorder, single episode, unspecified: Secondary | ICD-10-CM

## 2011-01-02 DIAGNOSIS — G379 Demyelinating disease of central nervous system, unspecified: Secondary | ICD-10-CM | POA: Insufficient documentation

## 2011-01-02 DIAGNOSIS — G589 Mononeuropathy, unspecified: Secondary | ICD-10-CM

## 2011-01-02 DIAGNOSIS — E119 Type 2 diabetes mellitus without complications: Secondary | ICD-10-CM

## 2011-01-02 DIAGNOSIS — G629 Polyneuropathy, unspecified: Secondary | ICD-10-CM

## 2011-01-02 MED ORDER — BUPROPION HCL ER (XL) 150 MG PO TB24: 150.0000 mg | ORAL_TABLET | ORAL | Status: DC

## 2011-01-02 MED ORDER — GABAPENTIN 300 MG PO CAPS: 300.0000 mg | ORAL_CAPSULE | Freq: Three times a day (TID) | ORAL | Status: DC

## 2011-01-02 NOTE — Progress Notes (Signed)
  Subjective:    Patient ID: [name], female    DOB: [birth year], 40 y.o.   MRN: [id number]  HPI DM- no symptomatic lows since stopping Glipizide.  Still taking Metformin twice daily.  Neuropathy- feet and legs 'are burning real bad'.  sxs have worsened recently.  Previously on gabapentin but stopped after gastric bypass.  Wants to restart.  Depression- pharmacy would not increase script to 60mg .  Feels that sxs are not well controlled.  Chest pain- continues to have intermittent CP.  Had nuclear stress test during hospitalization and this was normal.  Had an episode at work on Tuesday and again yesterday.  sxs last 3-4 minutes, resolve spontaneously.   Review of Systems For ROS see HPI     Objective:   Physical Exam  Constitutional: She is oriented to person, place, and time. She appears well-developed and well-nourished. No distress.  HENT:  Head: Normocephalic and atraumatic.  Eyes: Conjunctivae and EOM are normal. Pupils are equal, round, and reactive to light.  Neck: Normal range of motion. Neck supple. No thyromegaly present.  Cardiovascular: Normal rate, regular rhythm, normal heart sounds and intact distal pulses.   No murmur heard. Pulmonary/Chest: Effort normal and breath sounds normal. No respiratory distress.  Abdominal: Soft. She exhibits no distension. There is no tenderness.  Musculoskeletal: She exhibits no edema.  Lymphadenopathy:    She has no cervical adenopathy.  Neurological: She is alert and oriented to person, place, and time.  Skin: Skin is warm and dry.  Psychiatric: She has a normal mood and affect. Her behavior is normal.          Assessment & Plan:

## 2011-01-02 NOTE — Patient Instructions (Signed)
Follow up in 2 months to recheck diabetes Start the gabapentin nightly.  Increase to twice daily after 1-2 weeks.  Then increase to 3 tabs daily Add Wellbutrin to Celexa If you continue to have chest pain, call me and we'll get you set up w/ cards Call with any questions or concerns Happy Holidays!!

## 2011-01-11 NOTE — Assessment & Plan Note (Signed)
Chronic problem- recently worsening.  Restart gabapentin.  Titrate per instructions.  Pt expressed understanding and is in agreement w/ plan.

## 2011-01-11 NOTE — Assessment & Plan Note (Signed)
Deteriorated.  Pharmacy would not fill script for celexa 60mg .  Will continue at 40mg  and add Wellbutrin.  Will continue to monitor closely.

## 2011-01-11 NOTE — Assessment & Plan Note (Signed)
Is no longer having symptomatic lows since stopping glipizide.  Encouraged ADA diet and regular exercise.  Will continue to follow closely.

## 2011-01-19 ENCOUNTER — Other Ambulatory Visit: Payer: Self-pay | Admitting: Family Medicine

## 2011-01-19 MED ORDER — OMEPRAZOLE 20 MG PO CPDR: 20.0000 mg | DELAYED_RELEASE_CAPSULE | Freq: Every day | ORAL | Status: DC

## 2011-01-19 NOTE — Telephone Encounter (Signed)
rx sent to pharmacy by e-script  

## 2011-01-26 ENCOUNTER — Ambulatory Visit (INDEPENDENT_AMBULATORY_CARE_PROVIDER_SITE_OTHER): Payer: BC Managed Care – PPO | Admitting: Family Medicine

## 2011-01-26 ENCOUNTER — Encounter: Payer: Self-pay | Admitting: Family Medicine

## 2011-01-26 DIAGNOSIS — E785 Hyperlipidemia, unspecified: Secondary | ICD-10-CM

## 2011-01-26 DIAGNOSIS — J029 Acute pharyngitis, unspecified: Secondary | ICD-10-CM

## 2011-01-26 DIAGNOSIS — E119 Type 2 diabetes mellitus without complications: Secondary | ICD-10-CM

## 2011-01-26 DIAGNOSIS — R002 Palpitations: Secondary | ICD-10-CM

## 2011-01-26 DIAGNOSIS — J069 Acute upper respiratory infection, unspecified: Secondary | ICD-10-CM

## 2011-01-26 LAB — BASIC METABOLIC PANEL
CO2: 27 mEq/L (ref 19–32)
Glucose, Bld: 88 mg/dL (ref 70–99)
Potassium: 4.3 mEq/L (ref 3.5–5.1)
Sodium: 140 mEq/L (ref 135–145)

## 2011-01-26 LAB — HEPATIC FUNCTION PANEL
ALT: 24 U/L (ref 0–35)
Alkaline Phosphatase: 53 U/L (ref 39–117)
Bilirubin, Direct: 0 mg/dL (ref 0.0–0.3)
Total Protein: 7.3 g/dL (ref 6.0–8.3)

## 2011-01-26 LAB — POCT RAPID STREP A (OFFICE): Rapid Strep A Screen: NEGATIVE

## 2011-01-26 LAB — LDL CHOLESTEROL, DIRECT: Direct LDL: 83.3 mg/dL

## 2011-01-26 MED ORDER — BUPROPION HCL ER (XL) 150 MG PO TB24: 150.0000 mg | ORAL_TABLET | ORAL | Status: DC

## 2011-01-26 MED ORDER — OMEPRAZOLE 20 MG PO CPDR: 20.0000 mg | DELAYED_RELEASE_CAPSULE | Freq: Every day | ORAL | Status: DC

## 2011-01-26 MED ORDER — SIMVASTATIN 10 MG PO TABS: 10.0000 mg | ORAL_TABLET | Freq: Every evening | ORAL | Status: DC

## 2011-01-26 MED ORDER — GABAPENTIN 300 MG PO CAPS: 300.0000 mg | ORAL_CAPSULE | Freq: Three times a day (TID) | ORAL | Status: DC

## 2011-01-26 NOTE — Progress Notes (Signed)
  Subjective:    Patient ID: [name], female    DOB: Nov 28, 1970, 40 y.o.   MRN: [id number]  HPI DM- chronic problem, CBGs better controlled since stopping Glipizide.  Now on metformin.  No longer having symptomatic lows.  Denies SOB, HAs, visual changes, edema.  Hyperlipidemia- chronic problem.  Started Zocor in September.  No abd pain, N/V, myalgias.  Sore throat- sxs started Friday, worsened yesterday.  Low grade temp yesterday.  Has been taking tylenol and coricidin.    Palpitations- reports she had these during her hospitalization, had normal stress test.  Does not have cards f/u pending.  Reports sxs are now 'constant'.  Denies pain, 'it's irritating'.   Review of Systems For ROS see HPI     Objective:   Physical Exam  Vitals reviewed. Constitutional: She is oriented to person, place, and time. She appears well-developed and well-nourished. No distress.  HENT:  Head: Normocephalic and atraumatic.  Nose: Nose normal.  Mouth/Throat: Oropharynx is clear and moist. No oropharyngeal exudate.       TMs WNL bilaterally No TTP over sinuses  Eyes: Conjunctivae and EOM are normal. Pupils are equal, round, and reactive to light.  Neck: Normal range of motion. Neck supple. No thyromegaly present.  Cardiovascular: Normal rate, regular rhythm, normal heart sounds and intact distal pulses.   No murmur heard. Pulmonary/Chest: Effort normal and breath sounds normal. No respiratory distress.  Abdominal: Soft. She exhibits no distension. There is no tenderness.  Musculoskeletal: She exhibits no edema.  Lymphadenopathy:    She has no cervical adenopathy.  Neurological: She is alert and oriented to person, place, and time.  Skin: Skin is warm and dry.  Psychiatric: She has a normal mood and affect. Her behavior is normal.          Assessment & Plan:

## 2011-01-26 NOTE — Patient Instructions (Signed)
Follow up in 3 months to recheck diabetes Someone will call you with your cardiology appt You have a viral illness- drink plenty of fluids, tylenol/ibuprofen for pain/fever, REST! We'll notify you of your lab results and make any changes if needed Call with any questions or concerns Merry Christmas!!!

## 2011-02-03 NOTE — Assessment & Plan Note (Signed)
Negative rapid strep.  No evidence of bacterial infxn on exam.  Most likely viral.  Reviewed supportive care and red flags that should prompt return.  Pt expressed understanding and is in agreement w/ plan.

## 2011-02-03 NOTE — Assessment & Plan Note (Signed)
Reports this has been persistent since hospitalization.  Had normal stress test.  Will refer back to cards for complete evaluation.

## 2011-02-03 NOTE — Assessment & Plan Note (Signed)
Chronic problem, tolerating statin w/out difficulty.  LDL goal is <70 due to DM and hx of CVA.  Check labs.  Adjust meds prn

## 2011-02-03 NOTE — Assessment & Plan Note (Signed)
Chronic problem.  Fair control.  Pt has had no further symptomatic lows since stopping Glipizide.  UTD on eye exam.  Check labs.  Adjust meds prn

## 2011-02-04 ENCOUNTER — Telehealth: Payer: Self-pay | Admitting: *Deleted

## 2011-02-04 NOTE — Telephone Encounter (Signed)
Pt requested her latest labs from 01-26-11 to be sent to her home address noted in chart verified with pt to be correct. Printed and mailed. Pt ended call abruptly

## 2011-02-06 ENCOUNTER — Telehealth: Payer: Self-pay | Admitting: *Deleted

## 2011-02-06 NOTE — Telephone Encounter (Signed)
Pt called per had received lab results in mail and her dad noted her liver function as 53.33 which is in the low range and her father is concerned based on family history of kidney issues and pt wants to have referral sent for urology, advised MD Birdie Riddle is out of the office and I can have another MD verify the referral and pt wanted to wait til MD Birdie Riddle could review the chart instead, she was not in a hurry and only wanted MD Tabori to advise, I advised that I would send note to MD Tabori and call her back asap. Pt agreed

## 2011-02-08 NOTE — Telephone Encounter (Signed)
Prior to referral to urology or nephrology (nephrology would be more appropriate), she needs to have labs repeated.  Hold all NSAIDS, increase fluid intake and repeat labs at her convenience.  Dx- renal insufficiency

## 2011-02-09 NOTE — Telephone Encounter (Signed)
Spoke to pt to advise results/instructions. Pt understood. Set up pt for labs to be drawn due to renal insufficency on 02-23-11 at 3:45pm Pt accepted appt and will discontinue use of NSAIDS and increase fluid intake

## 2011-02-11 ENCOUNTER — Ambulatory Visit: Payer: BC Managed Care – PPO | Admitting: Physician Assistant

## 2011-02-13 ENCOUNTER — Encounter: Payer: Self-pay | Admitting: Family Medicine

## 2011-02-13 ENCOUNTER — Encounter: Payer: Self-pay | Admitting: Cardiovascular Disease

## 2011-02-13 ENCOUNTER — Ambulatory Visit (INDEPENDENT_AMBULATORY_CARE_PROVIDER_SITE_OTHER): Payer: BC Managed Care – PPO | Admitting: Cardiovascular Disease

## 2011-02-13 ENCOUNTER — Ambulatory Visit (INDEPENDENT_AMBULATORY_CARE_PROVIDER_SITE_OTHER): Payer: BC Managed Care – PPO | Admitting: Family Medicine

## 2011-02-13 DIAGNOSIS — E785 Hyperlipidemia, unspecified: Secondary | ICD-10-CM

## 2011-02-13 DIAGNOSIS — R002 Palpitations: Secondary | ICD-10-CM

## 2011-02-13 DIAGNOSIS — F329 Major depressive disorder, single episode, unspecified: Secondary | ICD-10-CM

## 2011-02-13 DIAGNOSIS — R079 Chest pain, unspecified: Secondary | ICD-10-CM

## 2011-02-13 DIAGNOSIS — I6350 Cerebral infarction due to unspecified occlusion or stenosis of unspecified cerebral artery: Secondary | ICD-10-CM

## 2011-02-13 DIAGNOSIS — F3289 Other specified depressive episodes: Secondary | ICD-10-CM

## 2011-02-13 DIAGNOSIS — J111 Influenza due to unidentified influenza virus with other respiratory manifestations: Secondary | ICD-10-CM | POA: Insufficient documentation

## 2011-02-13 DIAGNOSIS — I10 Essential (primary) hypertension: Secondary | ICD-10-CM

## 2011-02-13 DIAGNOSIS — E119 Type 2 diabetes mellitus without complications: Secondary | ICD-10-CM

## 2011-02-13 NOTE — Assessment & Plan Note (Signed)
Cholesterol is at goal on the current lipid regimen. No changes to the medications were made. HDL is low. We have encouraged exercise.

## 2011-02-13 NOTE — Patient Instructions (Signed)
This was the flu Drink plenty of fluids Alternate tylenol and ibuprofen every 4 hrs for fever and body aches REST! Call with any questions or concerns Hang in there!!!

## 2011-02-13 NOTE — Assessment & Plan Note (Signed)
Pt's cluster of sxs consistent w/ flu.  Pt is through the worst of it so will not start Tamiflu due to cost.  Reviewed supportive care and red flags that should prompt return.  Pt expressed understanding and is in agreement w/ plan.

## 2011-02-13 NOTE — Assessment & Plan Note (Signed)
She has a history of chest pain dating back 8 years. Negative stress test in the past, heart catheterization showing no significant disease in 2005. Recent negative stress test.  Symptoms seem somewhat atypical. No further testing indicated at this time. We could try nitroglycerin p.r.n. For symptoms for possible spasm. She certainly may have GI issues given previous surgery. She currently takes omeprazole b.i.d..  Unable to exclude costochondritis or pericarditis and she could try Advil or Aleve on a periodic basis. If symptoms persist, we could perhaps try to arrange a cardiac CT scan though it is possible that insurance would not cover this.

## 2011-02-13 NOTE — Assessment & Plan Note (Signed)
She appeared depressed in the hospital and again today. Uncertain if this is playing a role in her current presentation. She does report that sleep, chronic headaches, decreased energy, and chronic chest pain. The symptoms combined may point towards underlying depression.

## 2011-02-13 NOTE — Progress Notes (Signed)
Patient ID: [name], female    DOB: 27-Apr-1970, 41 y.o.   MRN: [id number]  HPI Comments: 41 year old woman with a history of chest pain dating back to 2005 at that time with cardiac catheterization on August 09, 2003 found to have mild plaque in the LAD, but there was no obstructive disease noted.   MUGA, which showed an ejection fraction of 51%.   Transesophageal echocardiogram in the setting of a CVA in May 2010  showed normal LV function, s/p gastric bypass surgery, Who presented to the hospital December 18, 2010 with chest pain.   She had a stress test in the hospital and showed no ischemia. Cardiac enzymes were negative. No EKG changes noted.  Hemoglobin A1c was elevated estimated at 7.9 Total cholesterol 136, LDL 71, HDL 20, normal renal function  She reports that she continues to have occasional episodes of chest pain. Typically these seem to occur at rest, sometimes with exertion.   EKG shows normal sinus rhythm with rate 75 beats per minute with T-wave abnormality in lead V5 and V6    Outpatient Encounter Prescriptions as of 02/13/2011  Medication Sig Dispense Refill  . amLODipine (NORVASC) 10 MG tablet Take 10 mg by mouth daily.        . benazepril (LOTENSIN) 40 MG tablet Take 40 mg by mouth daily.        Marland Kitchen buPROPion (WELLBUTRIN XL) 150 MG 24 hr tablet Take 1 tablet (150 mg total) by mouth every morning.  90 tablet  3  . citalopram (CELEXA) 40 MG tablet Take 40 mg by mouth daily.        . cloNIDine (CATAPRES) 0.1 MG tablet Take 0.1 mg by mouth 2 (two) times daily.        . fenofibrate 160 MG tablet Take 160 mg by mouth daily.        Marland Kitchen gabapentin (NEURONTIN) 300 MG capsule Take 1 capsule (300 mg total) by mouth 3 (three) times daily.  270 capsule  3  . glucose blood (ONE TOUCH ULTRA TEST) test strip 1 each by Other route. Use as instructed  Up to 6 times daily       . insulin aspart (NOVOLOG) 100 UNIT/ML injection Inject into the skin 3 (three) times daily before meals. 50 units        . metFORMIN (GLUCOPHAGE) 1000 MG tablet Take 1 tablet (1,000 mg total) by mouth 2 (two) times daily with a meal.  60 tablet  11  . Multiple Vitamin (MULTIVITAMIN) tablet Take 1 tablet by mouth daily.      Marland Kitchen omeprazole (PRILOSEC) 20 MG capsule Take 1 capsule (20 mg total) by mouth daily.  90 capsule  3  . promethazine (PHENERGAN) 25 MG tablet       . simvastatin (ZOCOR) 10 MG tablet Take 1 tablet (10 mg total) by mouth every evening.  90 tablet  3  . temazepam (RESTORIL) 15 MG capsule TAKE ONE CAPSULE BY MOUTH AT BEDTIME AS NEEDED FOR  SLEEP  90 capsule  1     Review of Systems  Constitutional: Negative.   HENT: Negative.   Eyes: Negative.   Respiratory: Negative.   Cardiovascular: Positive for chest pain.  Gastrointestinal: Negative.   Musculoskeletal: Negative.   Skin: Negative.   Neurological: Negative.   Hematological: Negative.   Psychiatric/Behavioral: Positive for dysphoric mood.  All other systems reviewed and are negative.    BP 122/80  Pulse 75  Ht 5' 2.5" (1.588 m)  Wt 191 lb (86.637 kg)  BMI 34.38 kg/m2  Physical Exam  Nursing note and vitals reviewed. Constitutional: She is oriented to person, place, and time. She appears well-developed and well-nourished.  HENT:  Head: Normocephalic.  Nose: Nose normal.  Mouth/Throat: Oropharynx is clear and moist.  Eyes: Conjunctivae are normal. Pupils are equal, round, and reactive to light.  Neck: Normal range of motion. Neck supple. No JVD present.  Cardiovascular: Normal rate, regular rhythm, S1 normal, S2 normal, normal heart sounds and intact distal pulses.  Exam reveals no gallop and no friction rub.   No murmur heard. Pulmonary/Chest: Effort normal and breath sounds normal. No respiratory distress. She has no wheezes. She has no rales. She exhibits no tenderness.  Abdominal: Soft. Bowel sounds are normal. She exhibits no distension. There is no tenderness.  Musculoskeletal: Normal range of motion. She exhibits no  edema and no tenderness.  Lymphadenopathy:    She has no cervical adenopathy.  Neurological: She is alert and oriented to person, place, and time. Coordination normal.  Skin: Skin is warm and dry. No rash noted. No erythema.  Psychiatric: She has a normal mood and affect. Her behavior is normal. Judgment and thought content normal.         Assessment and Plan

## 2011-02-13 NOTE — Patient Instructions (Signed)
You are doing well. Try NSAIDS: advil or aleve for inflammation. May want to try a lower dose aspirin 81 mg daily  Please call us if you have new issues that need to be addressed before your next appt.

## 2011-02-13 NOTE — Assessment & Plan Note (Signed)
We have encouraged continued exercise, careful diet management in an effort to lose weight. 

## 2011-02-13 NOTE — Progress Notes (Signed)
  Subjective:    Patient ID: [name], female    DOB: 1970-09-01, 41 y.o.   MRN: [id number]  HPI Flu- sxs started Saturday morning, yesterday was 1st day out of bed.  + fever.  Cough, bodyaches, chills, sweats, HA.  Vomiting, diarrhea.  Taking Alka Seltzer cold and flu.  Able to hold down fluids.  Review of Systems For ROS see HPI     Objective:   Physical Exam  Vitals reviewed. Constitutional: She appears well-developed and well-nourished. No distress.  HENT:  Head: Normocephalic and atraumatic.       TMs normal bilaterally Mild nasal congestion Throat w/out erythema, edema, or exudate  Eyes: Conjunctivae and EOM are normal. Pupils are equal, round, and reactive to light.  Neck: Normal range of motion. Neck supple.  Cardiovascular: Normal rate, regular rhythm, normal heart sounds and intact distal pulses.   No murmur heard. Pulmonary/Chest: Effort normal and breath sounds normal. No respiratory distress. She has no wheezes.       + hacking cough  Abdominal: Soft. Bowel sounds are normal. She exhibits no distension. There is no tenderness. There is no rebound and no guarding.  Lymphadenopathy:    She has no cervical adenopathy.          Assessment & Plan:

## 2011-02-23 ENCOUNTER — Telehealth: Payer: Self-pay | Admitting: *Deleted

## 2011-02-23 ENCOUNTER — Other Ambulatory Visit: Payer: BC Managed Care – PPO

## 2011-02-23 ENCOUNTER — Other Ambulatory Visit (INDEPENDENT_AMBULATORY_CARE_PROVIDER_SITE_OTHER): Payer: BC Managed Care – PPO

## 2011-02-23 DIAGNOSIS — N189 Chronic kidney disease, unspecified: Secondary | ICD-10-CM

## 2011-02-23 DIAGNOSIS — N390 Urinary tract infection, site not specified: Secondary | ICD-10-CM

## 2011-02-23 LAB — POCT URINALYSIS DIPSTICK
Bilirubin, UA: NEGATIVE
Ketones, UA: NEGATIVE
Spec Grav, UA: 1.01

## 2011-02-23 NOTE — Telephone Encounter (Signed)
Advised LABs to be drawn with lab tech.

## 2011-02-23 NOTE — Telephone Encounter (Signed)
She needs a BMP and UA (dx renal insufficiency)

## 2011-02-23 NOTE — Telephone Encounter (Signed)
Pt was scheduled for labs today 02-23-11 and noted per renal insufficiency, however unable to locate which labs to be drawn, reviewed notes and encounters in chart but still unable to locate the exact labs to be drawn, pt came into office and [facility] labs and urine has been collected, please advise which tests/labs to send for pt?

## 2011-02-24 LAB — BASIC METABOLIC PANEL
BUN: 13 mg/dL (ref 6–23)
CO2: 25 mEq/L (ref 19–32)
Chloride: 103 mEq/L (ref 96–112)
Potassium: 4.4 mEq/L (ref 3.5–5.1)

## 2011-02-25 LAB — URINE CULTURE: Colony Count: >=100000

## 2011-02-25 MED ORDER — FLUCONAZOLE 100 MG PO TABS: 100.0000 mg | ORAL_TABLET | Freq: Every day | ORAL | Status: DC

## 2011-03-02 ENCOUNTER — Encounter: Payer: Self-pay | Admitting: Cardiovascular Disease

## 2011-03-18 ENCOUNTER — Telehealth: Payer: Self-pay | Admitting: *Deleted

## 2011-03-18 NOTE — Telephone Encounter (Signed)
Pt left vm stating that she is having sharp pain on the left side under her breast noted only after eating or drinking, called pt to offer an apt for tomorrow, offered 9:30am but pt requested to be seen later in the afternoon, pt accepted the 2:15pm apt for tomorrow, advised that she can take tums with the omeprazole per verbal instructions from MD Tabori, also advised if pt has any shortness of breath or worsening sxs to go to the ER, pt advised she did not want to go there because last time they kept her, I advised that I understand the concern however she needs to go if the sxs worsen of SOB starts, pt understood.

## 2011-03-19 ENCOUNTER — Encounter: Payer: Self-pay | Admitting: Family Medicine

## 2011-03-19 ENCOUNTER — Ambulatory Visit (INDEPENDENT_AMBULATORY_CARE_PROVIDER_SITE_OTHER): Payer: BC Managed Care – PPO | Admitting: Family Medicine

## 2011-03-19 ENCOUNTER — Telehealth: Payer: Self-pay | Admitting: Internal Medicine

## 2011-03-19 ENCOUNTER — Ambulatory Visit: Payer: BC Managed Care – PPO | Admitting: Family Medicine

## 2011-03-19 DIAGNOSIS — J019 Acute sinusitis, unspecified: Secondary | ICD-10-CM

## 2011-03-19 DIAGNOSIS — R079 Chest pain, unspecified: Secondary | ICD-10-CM

## 2011-03-19 DIAGNOSIS — R1012 Left upper quadrant pain: Secondary | ICD-10-CM

## 2011-03-19 LAB — CBC WITH DIFFERENTIAL/PLATELET
Basophils Relative: 1 % (ref 0.0–3.0)
Eosinophils Relative: 3 % (ref 0.0–5.0)
HCT: 36.2 % (ref 36.0–46.0)
Hemoglobin: 11.9 g/dL — ABNORMAL LOW (ref 12.0–15.0)
Lymphs Abs: 2.1 10*3/uL (ref 0.7–4.0)
MCV: 77.6 fl — ABNORMAL LOW (ref 78.0–100.0)
Monocytes Absolute: 0.5 10*3/uL (ref 0.1–1.0)
Monocytes Relative: 5.4 % (ref 3.0–12.0)
Neutro Abs: 6 10*3/uL (ref 1.4–7.7)
Platelets: 261 10*3/uL (ref 150.0–400.0)
WBC: 8.9 10*3/uL (ref 4.5–10.5)

## 2011-03-19 LAB — BASIC METABOLIC PANEL
BUN: 12 mg/dL (ref 6–23)
CO2: 27 mEq/L (ref 19–32)
Calcium: 9.6 mg/dL (ref 8.4–10.5)
Creatinine, Ser: 1.1 mg/dL (ref 0.4–1.2)
GFR: 59.66 mL/min — ABNORMAL LOW (ref >60.00)
Glucose, Bld: 105 mg/dL — ABNORMAL HIGH (ref 70–99)

## 2011-03-19 LAB — H. PYLORI ANTIBODY, IGG: H Pylori IgG: NEGATIVE

## 2011-03-19 MED ORDER — GI COCKTAIL ~~LOC~~
30.0000 mL | Freq: Once | ORAL | Status: AC
  Administered 2011-03-19: 30 mL via ORAL

## 2011-03-19 MED ORDER — SUCRALFATE 1 G PO TABS: 1.0000 g | ORAL_TABLET | Freq: Four times a day (QID) | ORAL | Status: DC

## 2011-03-19 MED ORDER — AMOXICILLIN 875 MG PO TABS: 875.0000 mg | ORAL_TABLET | Freq: Two times a day (BID) | ORAL | Status: AC

## 2011-03-19 NOTE — Telephone Encounter (Signed)
Dr. Birdie Riddle requesting pt be seen asap for abdominal pain. Pt scheduled to see Amy Esterwood PA 03/20/11@2pm . Joseph Art will notify pt of appt date and time.

## 2011-03-19 NOTE — Assessment & Plan Note (Signed)
Pt's pain w/ eating and location is consistent w/ peptic ulcer disease.  sxs temporarily improved w/ GI cocktail.  Will start Carafate, check H pylori.  Given hx of Gastric bypass have concerns about possible ulceration of surgical sites.  Will refer to GI.  Reviewed supportive care and red flags that should prompt return.  Pt expressed understanding and is in agreement w/ plan.

## 2011-03-19 NOTE — Patient Instructions (Signed)
We'll notify you of your lab results Start the Carafate 30-60 minutes before meals to improve pain Start the Nexium daily rather than the omeprazole We'll notify you of your lab results Call with any questions or concerns Hang in there!!!

## 2011-03-19 NOTE — Progress Notes (Signed)
  Subjective:    Patient ID: [name], female    DOB: 1970/11/29, 41 y.o.   MRN: [id number]  HPI Sharp pains under L breast w/ eating and drinking.  Started yesterday.  Vomited dinner.  No nausea when not eating.  No fevers, diarrhea.  Currently on omeprazole.  Added Tums w/out relief.  Burping caused increased pain.  HA- reports facial pain/pressure.  HAs occuring daily.  Denies fever.  Bilateral ear fullness, nasal congestion, PND.  No cough.  + sick contacts.   Review of Systems For ROS see HPI     Objective:   Physical Exam  Vitals reviewed. Constitutional: She appears well-developed and well-nourished. No distress.  HENT:  Head: Normocephalic and atraumatic.  Right Ear: Tympanic membrane normal.  Left Ear: Tympanic membrane normal.  Nose: Mucosal edema and rhinorrhea present. Right sinus exhibits maxillary sinus tenderness and frontal sinus tenderness. Left sinus exhibits maxillary sinus tenderness and frontal sinus tenderness.  Mouth/Throat: Uvula is midline and mucous membranes are normal. Posterior oropharyngeal erythema present. No oropharyngeal exudate.  Eyes: Conjunctivae and EOM are normal. Pupils are equal, round, and reactive to light.  Neck: Normal range of motion. Neck supple.  Cardiovascular: Normal rate, regular rhythm and normal heart sounds.   Pulmonary/Chest: Effort normal and breath sounds normal. No respiratory distress. She has no wheezes.  Abdominal: Soft. Bowel sounds are normal. She exhibits no distension. There is tenderness (LUQ TTP). There is no rebound and no guarding.  Lymphadenopathy:    She has no cervical adenopathy.          Assessment & Plan:

## 2011-03-20 ENCOUNTER — Encounter: Payer: Self-pay | Admitting: Internal Medicine

## 2011-03-20 ENCOUNTER — Encounter: Payer: Self-pay | Admitting: Physician Assistant

## 2011-03-20 ENCOUNTER — Ambulatory Visit (INDEPENDENT_AMBULATORY_CARE_PROVIDER_SITE_OTHER): Payer: BC Managed Care – PPO | Admitting: Physician Assistant

## 2011-03-20 VITALS — BP 110/76 | HR 60 | Ht 64.0 in | Wt 191.4 lb

## 2011-03-20 DIAGNOSIS — Z800 Family history of malignant neoplasm of digestive organs: Secondary | ICD-10-CM

## 2011-03-20 DIAGNOSIS — R1013 Epigastric pain: Secondary | ICD-10-CM

## 2011-03-20 MED ORDER — ESOMEPRAZOLE MAGNESIUM 40 MG PO CPDR: 40.0000 mg | DELAYED_RELEASE_CAPSULE | Freq: Every day | ORAL | Status: DC

## 2011-03-20 NOTE — Patient Instructions (Signed)
Take the Nexium samples 1 capsule twice daily. Stay on the Carafate twice daily.  Stay on a bland diet with small frequent meals. We scheduled the Endoscopy with Dr. Henrene Pastor for [facility] 03-25-2011. Directions provided.

## 2011-03-20 NOTE — Progress Notes (Signed)
I agree with EGD, then consider colon. ?symptoms related to gastric plication/small gastric remnant? R/o GOO

## 2011-03-20 NOTE — Progress Notes (Signed)
Subjective:    Patient ID: [name], female    DOB: 06-Jul-1970, 41 y.o.   MRN: [id number]  HPI [name] is a 41 year old female known to Dr.Perry from prior evaluation in 2004 at which time she had an endoscopy and colonoscopy. Colonoscopy revealed 2 small polyps and sigmoid colon diverticulosis. Colon biopsies showed benign colonic mucosa. Upper endoscopy showed , reflux esophagitis.  Patient underwent gastric bypass surgery 18 months ago done at [facility]. She does not know the name of the type of surgery she has had. She says over the past 6 months she has been having some problems with her stomach and intermittent chest pains. She has been seen and evaluated by cardiology and told that her pain was not cardiac in etiology and that she should be seen by GI. She has been on Prilosec twice daily over the past 6 months as well. She denies any regular heartburn or indigestion, no dysphagia, or odynophagia. She does stay on a 325 mg aspirin daily because of history of 2 prior strokes. She is not on any other blood thinners.  She now presents with acute epigastric and left upper quadrant abdominal pain over the past 3-4 days which is worse immediately after by mouth intake. She did have some nausea and vomiting 2 nights ago, has not vomited any blood. She describes her pain as sharp and intense. Her bowel movements have been normal for her and she has not noted any melena or hematochezia.  She was seen by primary care earlier this week and switch to Nexium 40 mg daily and Carafate 4 times daily was also added. She says she maybe feels a little bit better today.  H. pylori antibody was done and was negative CBC showed hemoglobin of 11.9 MCV low at 74    Review of Systems  Constitutional: Positive for appetite change.  HENT: Negative.   Eyes: Negative.   Respiratory: Negative.   Cardiovascular: Positive for chest pain.  Gastrointestinal: Positive for nausea and abdominal pain. Negative for  vomiting.  Genitourinary: Negative.   Musculoskeletal: Negative.   Skin: Negative.   Neurological: Negative.   Hematological: Negative.   Psychiatric/Behavioral: Negative.    Outpatient Prescriptions Prior to Visit  Medication Sig Dispense Refill  . amLODipine (NORVASC) 10 MG tablet Take 10 mg by mouth daily.        Marland Kitchen amoxicillin (AMOXIL) 875 MG tablet Take 1 tablet (875 mg total) by mouth 2 (two) times daily.  20 tablet  0  . benazepril (LOTENSIN) 40 MG tablet Take 40 mg by mouth daily.        Marland Kitchen buPROPion (WELLBUTRIN XL) 150 MG 24 hr tablet Take 1 tablet (150 mg total) by mouth every morning.  90 tablet  3  . citalopram (CELEXA) 40 MG tablet Take 40 mg by mouth daily.        . cloNIDine (CATAPRES) 0.1 MG tablet Take 0.1 mg by mouth 2 (two) times daily.        . fenofibrate 160 MG tablet Take 160 mg by mouth daily.        Marland Kitchen gabapentin (NEURONTIN) 300 MG capsule Take 1 capsule (300 mg total) by mouth 3 (three) times daily.  270 capsule  3  . glucose blood (ONE TOUCH ULTRA TEST) test strip 1 each by Other route. Use as instructed  Up to 6 times daily       . insulin aspart (NOVOLOG) 100 UNIT/ML injection Inject into the skin 3 (three) times daily  before meals. 50 units       . metFORMIN (GLUCOPHAGE) 1000 MG tablet Take 1 tablet (1,000 mg total) by mouth 2 (two) times daily with a meal.  60 tablet  11  . Multiple Vitamin (MULTIVITAMIN) tablet Take 1 tablet by mouth daily.      . promethazine (PHENERGAN) 25 MG tablet       . simvastatin (ZOCOR) 10 MG tablet Take 1 tablet (10 mg total) by mouth every evening.  90 tablet  3  . sucralfate (CARAFATE) 1 G tablet Take 1 tablet (1 g total) by mouth 4 (four) times daily.  120 tablet  0  . temazepam (RESTORIL) 15 MG capsule TAKE ONE CAPSULE BY MOUTH AT BEDTIME AS NEEDED FOR  SLEEP  90 capsule  1  . omeprazole (PRILOSEC) 20 MG capsule Take 1 capsule (20 mg total) by mouth daily.  90 capsule  3  . PRILOSEC OTC 20 MG tablet        No Known  Allergies Active Ambulatory Problems    Diagnosis Date Noted  . RINGWORM 08/27/2008  . NEVUS, ATYPICAL 09/16/2009  . DIABETES MELLITUS, TYPE II 05/01/2008  . HYPERLIPIDEMIA 05/01/2008  . OBESITY 05/01/2008  . ANXIETY 05/01/2008  . DEPRESSION 05/01/2008  . HYPERTENSION 05/01/2008  . CVA 06/21/2008  . CONTACT DERMATITIS 09/19/2008  . LUMBAR RADICULOPATHY 12/02/2009  . SOMNOLENCE 12/05/2008  . WEAKNESS 07/26/2008  . NUMBNESS 07/23/2008  . DYSURIA 03/11/2009  . NOCTURIA 12/05/2008  . BARIATRIC SURGERY STATUS 09/16/2009  . SINUSITIS - ACUTE-NOS 03/24/2010  . UTI 04/14/2010  . Foot pain 05/13/2010  . Sinus headache 08/03/2010  . Insomnia 08/03/2010  . Chest pain 12/22/2010  . Neuropathy 01/02/2011  . URI (upper respiratory infection) 01/26/2011  . Palpitations 01/26/2011  . Flu 02/13/2011  . LUQ pain 03/19/2011  . Family hx of colon cancer 03/20/2011   Resolved Ambulatory Problems    Diagnosis Date Noted  . No Resolved Ambulatory Problems   Past Medical History  Diagnosis Date  . CVA (cerebral vascular accident)   . Hypertension   . Hyperlipidemia   . Obesity   . Depression   . Anxiety   . Diabetes mellitus   . Colon polyp        Objective:   Physical Exam well-developed white female in no acute distress, pleasant, blood pressure 110/76 pulse 60 weight 191. Accompanied by her husband. HEENT; nontraumatic, normocephalic, EOMI PERRLA sclera Neck; Supple no JVD  Cardiovascular; regular rate and rhythm with S1-S2 no murmur gallop, Pulmonary; clear bilaterally, Abdomen; soft ,she is tender in the epigastrium and left upper quadrant no guarding no rebound no palpable mass or hepatosplenomegaly, she does have laparoscopic incisional scars. Bowel sounds are present, ;Rectal; Brown Hemoccult negative stool, Extremities ;no clubbing, cyanosis or edema, ;mood and affect appropriate.        Assessment & Plan:  #24 41 year old female status post gastric bypass 2011 for obesity  done at [facility] presenting with three-day history of sharp epigastric and left upper quadrant pain. Patient maintained on a full-strength aspirin chronically because of prior history of CVA. I am concerned she has ulcer  disease which may be aspirin related. Plan; schedule for upper endoscopy with Dr. Henrene Pastor next week, procedure was discussed in detail with the patient and she is agreeable to proceed Increase Nexium to 40 mg twice daily in the short-term, samples given today Continue Carafate 1 g 4 times daily between meals and at bedtime Bland diet with very small frequent  feedings Patient is to continue her aspirin given her history. If endoscopy is negative she will likely need CT abdomen and pelvis  #2 positive family history of colon cancer in patient's father is tender in his early 52s Patient did have a colonoscopy in 2004 with 2 benign polyps. She should have followup colonoscopy will need to be scheduled after her epigastric pain is sorted out and improved.  #3 history of CVA x2 #4 insulin-dependent diabetes mellitus

## 2011-03-25 ENCOUNTER — Ambulatory Visit (AMBULATORY_SURGERY_CENTER): Payer: BC Managed Care – PPO | Admitting: Internal Medicine

## 2011-03-25 ENCOUNTER — Encounter: Payer: Self-pay | Admitting: Internal Medicine

## 2011-03-25 VITALS — BP 136/73 | HR 63 | Temp 99.0°F | Resp 17 | Ht 64.0 in | Wt 191.0 lb

## 2011-03-25 DIAGNOSIS — K259 Gastric ulcer, unspecified as acute or chronic, without hemorrhage or perforation: Secondary | ICD-10-CM

## 2011-03-25 DIAGNOSIS — R079 Chest pain, unspecified: Secondary | ICD-10-CM

## 2011-03-25 DIAGNOSIS — R1013 Epigastric pain: Secondary | ICD-10-CM

## 2011-03-25 LAB — GLUCOSE, CAPILLARY: Glucose-Capillary: 101 mg/dL — ABNORMAL HIGH (ref 70–99)

## 2011-03-25 MED ORDER — SODIUM CHLORIDE 0.9 % IV SOLN: 500.0000 mL | INTRAVENOUS | Status: DC

## 2011-03-25 NOTE — Progress Notes (Signed)
Propofol per s camp crna per protocol. See scanned intra procedure report. emw

## 2011-03-25 NOTE — Progress Notes (Signed)
Patient did not experience any of the following events: a burn prior to discharge; a fall within the facility; wrong site/side/patient/procedure/implant event; or a hospital transfer or hospital admission upon discharge from the facility. (G8907) Patient did not have preoperative order for IV antibiotic SSI prophylaxis. (G8918)  

## 2011-03-25 NOTE — Op Note (Signed)
[facility] [address] [employer] [address] [place], [place]  [address]  ENDOSCOPY PROCEDURE REPORT  PATIENT:  [name], [name]  MR#:  [id number] BIRTHDATE:  [birth year], 40 yrs. old  GENDER:  female  ENDOSCOPIST:  Docia Chuck. Geri Seminole, MD Referred by:  Office  PROCEDURE DATE:  03/25/2011 PROCEDURE:  EGD, diagnostic [id number] ASA CLASS:  Class II INDICATIONS:  chest pain (deemed non-cardiac) x 6 months., Post prandial Abdominal pain, left upper quadraudrant 1-2 weeks. 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  The LB [id number] endoscope was introduced through the mouth and advanced to the roux limb, limited by prior surgery.   The instrument was slowly withdrawn as the mucosa was fully examined. <<PROCEDUREIMAGES>>  The esophagus and gastroesophageal junction were completely normal in appearance.  S/p roux-en/y gastric bypass. 3-4cm gastric pouch. widely patent anastomosis. A 1cm clean based ulcer was found at the anastomosis on the jejunal  side. Normal blind limb and roux limb.   Retroflexed views not attempted due to anatomy.    The scope was then withdrawn from the patient and the procedure completed.  COMPLICATIONS:  None  ENDOSCOPIC IMPRESSION: 1) Normal esophagus 2) S/p roux-en/y gastric bypass 3) Ulcer at the anastomosis  RECOMMENDATIONS: 1) Continue NEXIUM DAILY (MORNING) 2) Continue CARAFATE 3 TIMES DAILY (AFTERNOON, EVENNIG, BEDTIME)  3) Avoid NSAIDS----  LIKE IBUPROFEN TYPE MEDICATIONS 4) CHECK WITH YOUR PRIMARY CARE PROVIDER OR NEUROLOGIST TO SEE IF BABAY ASA       EQUALLY ACCEPTABLE TO FULL ASA 4) OP follow-up WITH DR Ura Yingling in 4 weeks.  ______________________________ Docia Chuck. Geri Seminole, MD  CC:  Midge Minium, MD;  The Patient  n. eSIGNED:   Docia Chuck. Geri Seminole at 03/25/2011 02:21 PM  [place], [place], [id number]

## 2011-03-25 NOTE — Patient Instructions (Signed)
YOU HAD AN ENDOSCOPIC PROCEDURE TODAY AT THE [facility]: Refer to the procedure report that was given to you for any specific questions about what was found during the examination.  If the procedure report does not answer your questions, please call your gastroenterologist to clarify.  If you requested that your care partner not be given the details of your procedure findings, then the procedure report has been included in a sealed envelope for you to review at your convenience later.  YOU SHOULD EXPECT: Some feelings of bloating in the abdomen. Passage of more gas than usual.  Walking can help get rid of the air that was put into your GI tract during the procedure and reduce the bloating. If you had a lower endoscopy (such as a colonoscopy or flexible sigmoidoscopy) you may notice spotting of blood in your stool or on the toilet paper. If you underwent a bowel prep for your procedure, then you may not have a normal bowel movement for a few days.  DIET: Your first meal following the procedure should be a light meal and then it is ok to progress to your normal diet.  A half-sandwich or bowl of soup is an example of a good first meal.  Heavy or fried foods are harder to digest and may make you feel nauseous or bloated.  Likewise meals heavy in dairy and vegetables can cause extra gas to form and this can also increase the bloating.  Drink plenty of fluids but you should avoid alcoholic beverages for 24 hours.  ACTIVITY: Your care partner should take you home directly after the procedure.  You should plan to take it easy, moving slowly for the rest of the day.  You can resume normal activity the day after the procedure however you should NOT DRIVE or use heavy machinery for 24 hours (because of the sedation medicines used during the test).    SYMPTOMS TO REPORT IMMEDIATELY: A gastroenterologist can be reached at any hour.  During normal business hours, 8:30 AM to 5:00 PM Monday through Friday,  call [phone].  After hours and on weekends, please call the GI answering service at [phone] who will take a message and have the physician on call contact you.   Following lower endoscopy (colonoscopy or flexible sigmoidoscopy):  Excessive amounts of blood in the stool  Significant tenderness or worsening of abdominal pains  Swelling of the abdomen that is new, acute  Fever of 100F or higher  Following upper endoscopy (EGD)  Vomiting of blood or coffee ground material  New chest pain or pain under the shoulder blades  Painful or persistently difficult swallowing  New shortness of breath  Fever of 100F or higher  Black, tarry-looking stools  FOLLOW UP: If any biopsies were taken you will be contacted by phone or by letter within the next 1-3 weeks.  Call your gastroenterologist if you have not heard about the biopsies in 3 weeks.  Our staff will call the home number listed on your records the next business day following your procedure to check on you and address any questions or concerns that you may have at that time regarding the information given to you following your procedure. This is a courtesy call and so if there is no answer at the home number and we have not heard from you through the emergency physician on call, we will assume that you have returned to your regular daily activities without incident.  SIGNATURES/CONFIDENTIALITY: You and/or your care   partner have signed paperwork which will be entered into your electronic medical record.  These signatures attest to the fact that that the information above on your After Visit Summary has been reviewed and is understood.  Full responsibility of the confidentiality of this discharge information lies with you and/or your care-partner.  

## 2011-03-26 ENCOUNTER — Telehealth: Payer: Self-pay

## 2011-03-26 NOTE — Telephone Encounter (Signed)
Left a message on the pt's answering machine for the pt to call if any questions or concerns called [phone]. [place]

## 2011-03-27 ENCOUNTER — Other Ambulatory Visit: Payer: Self-pay | Admitting: Family Medicine

## 2011-03-27 MED ORDER — CLONIDINE HCL 0.1 MG PO TABS: 0.1000 mg | ORAL_TABLET | Freq: Two times a day (BID) | ORAL | Status: DC

## 2011-03-27 NOTE — Telephone Encounter (Signed)
rx sent to pharmacy by e-script  

## 2011-03-31 NOTE — Assessment & Plan Note (Signed)
EKG unchanged.  Unlikely to be cardiac given recent negative w/u.  More likely severe GERD/PUD related.  Reviewed supportive care and red flags that should prompt return.  Pt expressed understanding and is in agreement w/ plan.

## 2011-03-31 NOTE — Assessment & Plan Note (Signed)
sxs and PE consistent w/ infxn.  Start abx.  Reviewed supportive care and red flags that should prompt return.  Pt expressed understanding and is in agreement w/ plan.  

## 2011-04-06 ENCOUNTER — Telehealth: Payer: Self-pay | Admitting: Family Medicine

## 2011-04-06 MED ORDER — AMLODIPINE BESYLATE 10 MG PO TABS: 10.0000 mg | ORAL_TABLET | Freq: Every day | ORAL | Status: DC

## 2011-04-06 NOTE — Telephone Encounter (Signed)
rx sent to pharmacy by e-script  

## 2011-04-06 NOTE — Telephone Encounter (Signed)
Refill: Amlodipine 10mg  tab. Take 1 tablet by mouth every day. Qty 90. Last fill 01-09-11

## 2011-04-13 ENCOUNTER — Telehealth: Payer: Self-pay | Admitting: Family Medicine

## 2011-04-13 NOTE — Telephone Encounter (Signed)
Refill: Fenofibrate 160mg  tab #90. Take 1 tablet by mouth every day. Last fill 01-16-11

## 2011-04-13 NOTE — Telephone Encounter (Signed)
Refill: Benazepril 40mg  tab #90. Take 1 tablet by mouth every day. Last fill 01-16-11

## 2011-04-14 MED ORDER — FENOFIBRATE 160 MG PO TABS: 160.0000 mg | ORAL_TABLET | Freq: Every day | ORAL | Status: DC

## 2011-04-14 MED ORDER — BENAZEPRIL HCL 40 MG PO TABS: 40.0000 mg | ORAL_TABLET | Freq: Every day | ORAL | Status: DC

## 2011-04-14 NOTE — Telephone Encounter (Signed)
rx sent to pharmacy by e-script  

## 2011-04-15 ENCOUNTER — Telehealth: Payer: Self-pay | Admitting: *Deleted

## 2011-04-15 MED ORDER — SULFAMETHOXAZOLE-TRIMETHOPRIM 800-160 MG PO TABS: 1.0000 | ORAL_TABLET | Freq: Two times a day (BID) | ORAL | Status: AC

## 2011-04-15 NOTE — Telephone Encounter (Signed)
Ok for Bactrim DS bid x 10 days

## 2011-04-15 NOTE — Telephone Encounter (Signed)
Pt has not been seen in a month.  Will need appt prior to abx.

## 2011-04-15 NOTE — Telephone Encounter (Signed)
Pt called to advise that she is still experiencing sinus issues, pt notes a headache in the front portion of her head, as well as her eyes and teeth are also hurting, with bloody drainage also noted, please advise

## 2011-04-15 NOTE — Telephone Encounter (Signed)
rx sent to pharmacy by e-script Pt aware

## 2011-04-15 NOTE — Telephone Encounter (Signed)
Called pt to advise about apt, pt advised that she is scheduled for an upcoming apt on 04-27-11 and that she currently can not afford a co-pay at this time

## 2011-04-22 ENCOUNTER — Encounter: Payer: Self-pay | Admitting: Internal Medicine

## 2011-04-22 ENCOUNTER — Ambulatory Visit (INDEPENDENT_AMBULATORY_CARE_PROVIDER_SITE_OTHER): Payer: BC Managed Care – PPO | Admitting: Internal Medicine

## 2011-04-22 DIAGNOSIS — R1013 Epigastric pain: Secondary | ICD-10-CM

## 2011-04-22 DIAGNOSIS — K259 Gastric ulcer, unspecified as acute or chronic, without hemorrhage or perforation: Secondary | ICD-10-CM

## 2011-04-22 DIAGNOSIS — D509 Iron deficiency anemia, unspecified: Secondary | ICD-10-CM

## 2011-04-22 DIAGNOSIS — K219 Gastro-esophageal reflux disease without esophagitis: Secondary | ICD-10-CM

## 2011-04-22 DIAGNOSIS — Z800 Family history of malignant neoplasm of digestive organs: Secondary | ICD-10-CM

## 2011-04-22 MED ORDER — ESOMEPRAZOLE MAGNESIUM 40 MG PO CPDR: 40.0000 mg | DELAYED_RELEASE_CAPSULE | Freq: Every day | ORAL | Status: DC

## 2011-04-22 MED ORDER — OMEPRAZOLE 40 MG PO CPDR: 40.0000 mg | DELAYED_RELEASE_CAPSULE | Freq: Two times a day (BID) | ORAL | Status: DC

## 2011-04-22 NOTE — Progress Notes (Signed)
HISTORY OF PRESENT ILLNESS:  [name] is a 41 y.o. female with hypertension, hyperlipidemia, coronary artery disease, prior CVA, and obesity status post Roux-en-Y gastric bypass surgery. She was seen in the office 03/20/2011 for abdominal pain. See that dictation. She was also noted to have a mild microcytic anemia. Upper endoscopy was performed February 20 and revealed evidence of prior gastric bypass surgery, normal esophagus, and a 1 cm anastomotic ulcer. She was treated with twice a day Nexium, 3 times a day Carafate, avoidance of NSAIDs, and check with her neurologist regarding changing from adult aspirin to baby aspirin. She could not comply with Carafate due to constipation. She has avoided NSAIDs. She did get approval for change to baby aspirin. Approximately 2 and half weeks after her endoscopy, she was feeling much better. Her appetite improved. However, she ran out of Nexium and did not refill it due to cost. Since then, she has had progressive recurrence of her abdominal complaints. As well, significant reflux symptoms. She did miss work yesterday due to feeling ill. No new issues or problems.  REVIEW OF SYSTEMS:  All non-GI ROS negative except for sinus an allergy trouble, anxiety, back pain, depression, fatigue, night sweats, shortness of breath, sleeping problems  Past Medical History  Diagnosis Date  . CVA (cerebral vascular accident)   . Hypertension   . Hyperlipidemia   . Obesity   . Depression   . Anxiety   . Diabetes mellitus     type 2  . Colon polyp   . GERD (gastroesophageal reflux disease)   . Myocardial infarction     WITH STROKE    Past Surgical History  Procedure Date  . Appendectomy   . Right oophorectomy   . Tubal ligation     R tube removed still w/ Left  . Dilation and curettage of uterus   . Gastric bypass 09/2009  . S/p gastric bypass     2011 [facility]    Social History [place]  reports that she has never smoked. She has never used  smokeless tobacco. She reports that she does not drink alcohol or use illicit drugs.  family history includes Colon cancer in her father; Diabetes in her father; Heart disease in her father; Hypertension in her father and mother; Kidney disease in her father; and Stroke in her father.  No Known Allergies     PHYSICAL EXAMINATION: Vital signs: BP 102/70  Pulse 64  Ht 5\' 4"  (1.626 m)  Wt 192 lb 4 oz (87.204 kg)  BMI 33.00 kg/m2 General: Well-developed, well-nourished, no acute distress HEENT: Sclerae are anicteric, conjunctiva pink. Oral mucosa intact Lungs: Clear Heart: Regular Abdomen: soft, nontender, nondistended, no obvious ascites, no peritoneal signs, normal bowel sounds. No organomegaly. Extremities: No edema Psychiatric: alert and oriented x3. Cooperative    ASSESSMENT:  #1. Problems with epigastric pain secondary to anastomotic ulcer and/or GERD. Significant recurrence of PPI. #2. Status post Roux-en-Y gastric bypass surgery #3. Family history of colon cancer in her father in his early 21s. Patient's last colonoscopy was in 2004. Due for followup. #4. Multiple significant medical problems   PLAN:  #1. Prescribe omeprazole 40 mg by mouth twice a day. Hopefully generic more affordable #2. Reflux precautions #3. Continue with other previous recommendations #4. Work excuse note for yesterday and today provided #5. Routine office followup in about 6 weeks #6. Discuss repeat screening colonoscopy at that time.

## 2011-04-22 NOTE — Patient Instructions (Signed)
We have sent the following medications to your pharmacy for you to pick up at your convenience:  Omeprazole.  We have given you some samples of Nexium to use until you fill your prescription.  Please follow up in 6 weeks

## 2011-04-24 ENCOUNTER — Other Ambulatory Visit: Payer: Self-pay | Admitting: *Deleted

## 2011-04-24 NOTE — Telephone Encounter (Signed)
Pt left vm requesting medications refills for Citalopram,Metformin and Phenagran, called to confirm, pt advised that she is coming in the office this Monday and that she has enough medications to last her until then she will just wait till she comes in the office to get the refills.

## 2011-04-27 ENCOUNTER — Encounter: Payer: Self-pay | Admitting: Family Medicine

## 2011-04-27 ENCOUNTER — Other Ambulatory Visit: Payer: Self-pay | Admitting: Internal Medicine

## 2011-04-27 ENCOUNTER — Ambulatory Visit (INDEPENDENT_AMBULATORY_CARE_PROVIDER_SITE_OTHER): Payer: BC Managed Care – PPO | Admitting: Family Medicine

## 2011-04-27 VITALS — BP 124/75 | HR 74 | Temp 98.6°F | Ht 63.75 in | Wt 194.2 lb

## 2011-04-27 DIAGNOSIS — K259 Gastric ulcer, unspecified as acute or chronic, without hemorrhage or perforation: Secondary | ICD-10-CM | POA: Insufficient documentation

## 2011-04-27 DIAGNOSIS — IMO0002 Reserved for concepts with insufficient information to code with codable children: Secondary | ICD-10-CM

## 2011-04-27 DIAGNOSIS — E119 Type 2 diabetes mellitus without complications: Secondary | ICD-10-CM

## 2011-04-27 MED ORDER — BUPROPION HCL ER (XL) 150 MG PO TB24: 150.0000 mg | ORAL_TABLET | ORAL | Status: DC

## 2011-04-27 MED ORDER — CLONIDINE HCL 0.1 MG PO TABS: 0.1000 mg | ORAL_TABLET | Freq: Two times a day (BID) | ORAL | Status: DC

## 2011-04-27 MED ORDER — FENOFIBRATE 160 MG PO TABS: 160.0000 mg | ORAL_TABLET | Freq: Every day | ORAL | Status: DC

## 2011-04-27 MED ORDER — BENAZEPRIL HCL 40 MG PO TABS: 40.0000 mg | ORAL_TABLET | Freq: Every day | ORAL | Status: DC

## 2011-04-27 MED ORDER — PROMETHAZINE HCL 25 MG PO TABS: 25.0000 mg | ORAL_TABLET | Freq: Four times a day (QID) | ORAL | Status: DC | PRN

## 2011-04-27 MED ORDER — METFORMIN HCL 1000 MG PO TABS: 1000.0000 mg | ORAL_TABLET | Freq: Two times a day (BID) | ORAL | Status: DC

## 2011-04-27 MED ORDER — CITALOPRAM HYDROBROMIDE 40 MG PO TABS: 40.0000 mg | ORAL_TABLET | Freq: Every day | ORAL | Status: DC

## 2011-04-27 MED ORDER — AMLODIPINE BESYLATE 10 MG PO TABS: 10.0000 mg | ORAL_TABLET | Freq: Every day | ORAL | Status: DC

## 2011-04-27 NOTE — Assessment & Plan Note (Signed)
Chronic problem.  Typically fair control.  On minimal meds since gastric bypass.  Check labs.  Adjust meds prn.

## 2011-04-27 NOTE — Assessment & Plan Note (Signed)
Deteriorated.  Unable to take NSAIDs due to gastric ulcer.  Using Vicodin and flexeril w/ some relief.  If sxs don't improve in 1 week (total of 2 weeks) she should call ortho for additional evaluation and tx.  Pt expressed understanding and is in agreement w/ plan.

## 2011-04-27 NOTE — Progress Notes (Signed)
  Subjective:    Patient ID: [name], female    DOB: [birth year], 41 y.o.   MRN: [id number]  HPI DM- chronic problem.  Off Novolog.  On Metformin BID.  CBGs well controlled per pt report.  Denies symptomatic lows.  No CP, SOB, HAs, visual changes, edema.  Gastric ulcer- pt saw Dr Henrene Pastor and was started on PPI BID.  Is having problems w/ pharmacy approval for BID dosing.  Has been out of meds and sxs are flaring.   Bulging discs- 1 week ago did heavy lifting and injured back.  Pain is radiating down R leg.  Taking boyfriend's Vicodin w/ some relief.  Also using Flexeril.  Leg cramps- bilateral, 'it's like a charley horse'.  Admits to poor water intake.   Review of Systems For ROS see HPI     Objective:   Physical Exam  Constitutional: She is oriented to person, place, and time. She appears well-developed and well-nourished. No distress.  HENT:  Head: Normocephalic and atraumatic.  Eyes: Conjunctivae and EOM are normal. Pupils are equal, round, and reactive to light.  Neck: Normal range of motion. Neck supple. No thyromegaly present.  Cardiovascular: Normal rate, regular rhythm, normal heart sounds and intact distal pulses.   No murmur heard. Pulmonary/Chest: Effort normal and breath sounds normal. No respiratory distress.  Abdominal: Soft. She exhibits no distension. There is no tenderness. There is no rebound and no guarding.  Musculoskeletal: She exhibits tenderness (over R lumbar area, paraspinal muscle and SI joint.). She exhibits no edema.       + SLR on R  Lymphadenopathy:    She has no cervical adenopathy.  Neurological: She is alert and oriented to person, place, and time. She has normal reflexes. Coordination normal.  Skin: Skin is warm and dry.  Psychiatric: She has a normal mood and affect. Her behavior is normal.          Assessment & Plan:

## 2011-04-27 NOTE — Patient Instructions (Signed)
Call Dr Henrene Pastor and ask about your prior authorization for the Omeprazole We'll notify you of your lab results and make any changes if needed Call ortho about your back Continue the Vicodin and Flexeril as needed for pain Call with any questions or concerns Hang in there!

## 2011-04-27 NOTE — Assessment & Plan Note (Signed)
New.  At site of gastric bypass anastomosis.  Pt has been out of PPI since 3/20 and sxs have returned.  Samples of Nexium provided and pt encouraged to call GI to check on status of prior auth.  Pt expressed understanding and is in agreement w/ plan.

## 2011-04-28 ENCOUNTER — Telehealth: Payer: Self-pay

## 2011-04-28 LAB — BASIC METABOLIC PANEL
BUN: 18 mg/dL (ref 6–23)
CO2: 27 mEq/L (ref 19–32)
Calcium: 9.1 mg/dL (ref 8.4–10.5)
Creatinine, Ser: 1.4 mg/dL — ABNORMAL HIGH (ref 0.4–1.2)
GFR: 43.73 mL/min — ABNORMAL LOW (ref >60.00)
Glucose, Bld: 96 mg/dL (ref 70–99)
Sodium: 135 mEq/L (ref 135–145)

## 2011-04-28 NOTE — Telephone Encounter (Signed)
left message requesting prior authorization for pt's prilosec

## 2011-04-28 NOTE — Telephone Encounter (Signed)
told pt I was waiting to hear back from [employer] on prior auth of prilosec; told her I would call her when I had heard back

## 2011-04-30 ENCOUNTER — Other Ambulatory Visit: Payer: Self-pay

## 2011-04-30 ENCOUNTER — Telehealth: Payer: Self-pay

## 2011-04-30 ENCOUNTER — Other Ambulatory Visit (INDEPENDENT_AMBULATORY_CARE_PROVIDER_SITE_OTHER): Payer: BC Managed Care – PPO

## 2011-04-30 DIAGNOSIS — N289 Disorder of kidney and ureter, unspecified: Secondary | ICD-10-CM

## 2011-04-30 LAB — BASIC METABOLIC PANEL
BUN: 12 mg/dL (ref 6–23)
GFR: 50.3 mL/min — ABNORMAL LOW (ref >60.00)
Glucose, Bld: 224 mg/dL — ABNORMAL HIGH (ref 70–99)
Potassium: 4 mEq/L (ref 3.5–5.1)

## 2011-04-30 MED ORDER — OMEPRAZOLE 40 MG PO CPDR: 40.0000 mg | DELAYED_RELEASE_CAPSULE | Freq: Two times a day (BID) | ORAL | Status: DC

## 2011-04-30 NOTE — Telephone Encounter (Signed)
Requesting prior auth. for omeprazole; representative said rx was approved so I sent rx to [facility]

## 2011-05-05 NOTE — Telephone Encounter (Signed)
CMA opened another phone note message answered.

## 2011-05-12 ENCOUNTER — Encounter: Payer: Self-pay | Admitting: Family Medicine

## 2011-05-12 NOTE — Telephone Encounter (Signed)
Please advise 

## 2011-05-14 NOTE — Telephone Encounter (Signed)
Called pt to schedule a 30 min office visit, received VM box, left message for pt to call back and schedule a 30 min office visit to discuss current issues.

## 2011-05-20 ENCOUNTER — Ambulatory Visit (INDEPENDENT_AMBULATORY_CARE_PROVIDER_SITE_OTHER): Payer: BC Managed Care – PPO | Admitting: Family Medicine

## 2011-05-20 ENCOUNTER — Encounter: Payer: Self-pay | Admitting: Family Medicine

## 2011-05-20 VITALS — BP 122/75 | HR 85 | Temp 98.7°F | Ht 64.25 in | Wt 196.6 lb

## 2011-05-20 DIAGNOSIS — R52 Pain, unspecified: Secondary | ICD-10-CM

## 2011-05-20 DIAGNOSIS — R404 Transient alteration of awareness: Secondary | ICD-10-CM

## 2011-05-20 DIAGNOSIS — F329 Major depressive disorder, single episode, unspecified: Secondary | ICD-10-CM

## 2011-05-20 DIAGNOSIS — F3289 Other specified depressive episodes: Secondary | ICD-10-CM

## 2011-05-20 LAB — CBC WITH DIFFERENTIAL/PLATELET
Basophils Absolute: 0.1 10*3/uL (ref 0.0–0.1)
Eosinophils Absolute: 0.3 10*3/uL (ref 0.0–0.7)
HCT: 33.3 % — ABNORMAL LOW (ref 36.0–46.0)
Lymphs Abs: 2.2 10*3/uL (ref 0.7–4.0)
MCV: 77.3 fl — ABNORMAL LOW (ref 78.0–100.0)
Monocytes Absolute: 0.5 10*3/uL (ref 0.1–1.0)
Monocytes Relative: 5.4 % (ref 3.0–12.0)
Platelets: 248 10*3/uL (ref 150.0–400.0)
RDW: 13.6 % (ref 11.5–14.6)

## 2011-05-20 LAB — BASIC METABOLIC PANEL
Calcium: 9.3 mg/dL (ref 8.4–10.5)
GFR: 52.73 mL/min — ABNORMAL LOW (ref >60.00)
Sodium: 133 mEq/L — ABNORMAL LOW (ref 135–145)

## 2011-05-20 LAB — SEDIMENTATION RATE: Sed Rate: 17 mm/hr (ref 0–22)

## 2011-05-20 MED ORDER — BUPROPION HCL ER (XL) 300 MG PO TB24: 300.0000 mg | ORAL_TABLET | Freq: Every day | ORAL | Status: DC

## 2011-05-20 NOTE — Assessment & Plan Note (Signed)
New.  Unclear if this is depression, rheumatologic process, or residual pain from recent injuries.  Check labs.  If continued sxs w/out evidence of cause will refer to rheum.  Reviewed supportive care and red flags that should prompt return.  Pt expressed understanding and is in agreement w/ plan.

## 2011-05-20 NOTE — Progress Notes (Signed)
  Subjective:    Patient ID: BRICIA FAHL, female    DOB: 07-06-1970, 41 y.o.   MRN: [id number]  HPI Not feeling well- reports >6 months of 'just not feeling good'.  + fatigue, joint pains, HA.  Having difficulty staying awake and forcing herself to get out of bed.  Still having CP despite normal cards eval.  Decreased urination.  Admits to depression w/ dad and sister's illnesses, work stress, son w/ ADHD.  Has good relationship.  Partner tells her that she snores.  Has never been told she has breathing pauses.  Will wake up feeling like she hasn't slept at all.   Review of Systems For ROS see HPI     Objective:   Physical Exam  Vitals reviewed. Constitutional: She is oriented to person, place, and time. She appears well-developed and well-nourished. No distress.  HENT:  Head: Normocephalic and atraumatic.  Eyes: Conjunctivae and EOM are normal. Pupils are equal, round, and reactive to light.  Neck: Normal range of motion. Neck supple. No thyromegaly present.  Cardiovascular: Normal rate, regular rhythm, normal heart sounds and intact distal pulses.   No murmur heard. Pulmonary/Chest: Effort normal and breath sounds normal. No respiratory distress.  Abdominal: Soft. She exhibits no distension. There is no tenderness.  Musculoskeletal: She exhibits no edema and no tenderness (no TTP over multiple trigger points).  Lymphadenopathy:    She has no cervical adenopathy.  Neurological: She is alert and oriented to person, place, and time.  Skin: Skin is warm and dry.  Psychiatric: She has a normal mood and affect. Her behavior is normal.          Assessment & Plan:

## 2011-05-20 NOTE — Assessment & Plan Note (Signed)
Given daytime sleepiness and fatigue, along w/ reported snoring- have concern for sleep apnea.  Refer to pulm for complete evaluation.  Pt expressed understanding and is in agreement w/ plan.

## 2011-05-20 NOTE — Patient Instructions (Signed)
We'll notify you of your pulmonary appt We'll notify you of your lab results Increase Wellbutrin to 300 Call with any questions or concerns Hang in there!!!

## 2011-05-20 NOTE — Assessment & Plan Note (Signed)
Deteriorated.  Pt tearful in office today.  Dad recently started HD, sister w/ closed head injury.  Will increase Wellbutrin to 300mg  daily and follow closely.

## 2011-05-21 LAB — ANA: Anti Nuclear Antibody(ANA): NEGATIVE

## 2011-05-22 ENCOUNTER — Encounter: Payer: Self-pay | Admitting: Family Medicine

## 2011-05-22 NOTE — Telephone Encounter (Signed)
Sent pt note back concerning request for sleep study.

## 2011-06-08 ENCOUNTER — Encounter: Payer: Self-pay | Admitting: Family Medicine

## 2011-06-09 ENCOUNTER — Ambulatory Visit: Payer: BC Managed Care – PPO | Admitting: Internal Medicine

## 2011-06-09 NOTE — Telephone Encounter (Signed)
Mailed 2 letters of mental health services for the pt, noted one list for [place] and the other list noted for pt current county [place], pt will contact our office if any further need noted, per pt email stating she is still having depression concerns, MD Birdie Riddle made aware verbally.

## 2011-06-19 ENCOUNTER — Institutional Professional Consult (permissible substitution): Payer: BC Managed Care – PPO | Admitting: Pulmonary Disease

## 2011-06-19 ENCOUNTER — Ambulatory Visit (INDEPENDENT_AMBULATORY_CARE_PROVIDER_SITE_OTHER): Payer: BC Managed Care – PPO | Admitting: Pulmonary Disease

## 2011-06-19 ENCOUNTER — Encounter: Payer: Self-pay | Admitting: Pulmonary Disease

## 2011-06-19 VITALS — BP 106/78 | HR 78 | Temp 98.7°F | Ht 62.5 in | Wt 195.2 lb

## 2011-06-19 DIAGNOSIS — G4710 Hypersomnia, unspecified: Secondary | ICD-10-CM

## 2011-06-19 DIAGNOSIS — R404 Transient alteration of awareness: Secondary | ICD-10-CM

## 2011-06-19 NOTE — Progress Notes (Signed)
Subjective:    Patient ID: [name], female    DOB: 1970/03/29, 41 y.o.   MRN: [id number]  HPI The patient is a 41 year old female who I've been asked to see for daytime hypersomnia.  The patient states that she has had significant daytime sleepiness over the last one year, but may have had some issues in early adulthood.  She has been noted to have very loud snoring, but no one has mentioned an abnormal breathing pattern during sleep.  She has very frequent awakenings at night, but denies choking arousals.  She is not rested in the mornings upon arising.  She does move her legs a lot at night for unknown reason, but is also on gabapentin for presumed neuropathy.  She is unsure if she kicks during the night, but states that her covers very disheveled in the mornings.  She notes severe sleep pressure during the day with her lunch and also rest breaks.  She falls asleep quickly trying to watch movies or television.  She also has sleepiness with driving.  The patient has had bariatric surgery in the past where she lost significant weight, but has gained 25 pounds back over the last 2 years.  Her Epworth sleepiness score today is 18.  Sleep Questionnaire: What time do you typically go to bed?( Between what hours) 9 - 10 pm How long does it take you to fall asleep? 1 hour How many times during the night do you wake up? 6 What time do you get out of bed to start your day? 0330 Do you drive or operate heavy machinery in your occupation? No How much has your weight changed (up or down) over the past two years? (In pounds) 25 lb (11.34 kg) Have you ever had a sleep study before? No Do you currently use CPAP? No Do you wear oxygen at any time? No    Review of Systems  Constitutional: Negative.  Negative for fever and unexpected weight change.  HENT: Negative for ear pain, nosebleeds, congestion, sore throat, rhinorrhea, sneezing, trouble swallowing, dental problem, postnasal drip and sinus pressure.   Eyes:  Negative.  Negative for redness and itching.  Respiratory: Positive for shortness of breath. Negative for cough, chest tightness and wheezing.   Cardiovascular: Positive for chest pain. Negative for palpitations and leg swelling.  Gastrointestinal: Negative.  Negative for nausea and vomiting.  Genitourinary: Negative.  Negative for dysuria.  Musculoskeletal: Negative.  Negative for joint swelling.  Skin: Negative.  Negative for rash.  Neurological: Positive for headaches.  Hematological: Negative.  Does not bruise/bleed easily.  Psychiatric/Behavioral: Negative for dysphoric mood. The patient is nervous/anxious.        Objective:   Physical Exam Constitutional:  Overweight female, no acute distress  HENT:  Nares patent without discharge, but deviation to left with narrowing.   Oropharynx without exudate, palate and uvula are elongated.  Small oropharynx.   Eyes:  Perrla, eomi, no scleral icterus  Neck:  No JVD, no TMG  Cardiovascular:  Normal rate, regular rhythm, no rubs or gallops.  No murmurs        Intact distal pulses  Pulmonary :  Normal breath sounds, no stridor or respiratory distress   No rales, rhonchi, or wheezing  Abdominal:  Soft, nondistended, bowel sounds present.  No tenderness noted.   Musculoskeletal:  No lower extremity edema noted.  Lymph Nodes:  No cervical lymphadenopathy noted  Skin:  No cyanosis noted  Neurologic: appears sleepy, appropriate, moves all 4 extremities  without obvious deficit.         Assessment & Plan:

## 2011-06-19 NOTE — Assessment & Plan Note (Signed)
I suspect from the patient's history that she has clinically significant obstructive sleep apnea.  However, I cannot exclude another issue such as the periodic limb movement syndrome.  Would also consider a daytime sleep disorder such as narcolepsy or idiopathic hypersomnia if her sleep study turns out to be unremarkable.  Will arrange for nocturnal polysomnography, and will see the patient back once the results are available.  I have also reminded her of her moral responsibility to not drive if she is sleepy.

## 2011-06-19 NOTE — Patient Instructions (Signed)
Will schedule for a sleep study, and arrange followup once results are available. Do not drive if sleepy.

## 2011-06-22 ENCOUNTER — Telehealth: Payer: Self-pay | Admitting: Family Medicine

## 2011-06-22 NOTE — Telephone Encounter (Signed)
Ok for #90, 1 refill 

## 2011-06-22 NOTE — Telephone Encounter (Signed)
Last OV 06-19-11 last refill 12-19-10 #90 with 1 refill

## 2011-06-22 NOTE — Telephone Encounter (Signed)
Refill: Temazepam 15mg  cap. Take one capsule by mouth at bedtime as needed for sleep. Qty 90. Last fill 03-21-11

## 2011-06-23 IMAGING — MR MR MRA HEAD W/O CM
7 of 11 series · 25 of 48 positions shown · non-contrast
Comparison: Head CT same day.  MRI 06/09/2008

MRI HEAD

CLINICAL DATA: Left sided numbness and weakness.  History of
previous stroke.  Diabetes and hypertension.

MRI HEAD WITHOUT CONTRAST
MRA HEAD WITHOUT CONTRAST
TECHNIQUE: Multiplanar, multiecho pulse sequences of the brain and
surrounding structures were obtained according to standard protocol
without intravenous contrast.  Angiographic images of the head were
obtained using MRA technique without contrast.

[Series 3: T1 · sagittal · 5.0mm · 0.47mm/px · 1 of 12 slices shown]
[im 1/12]
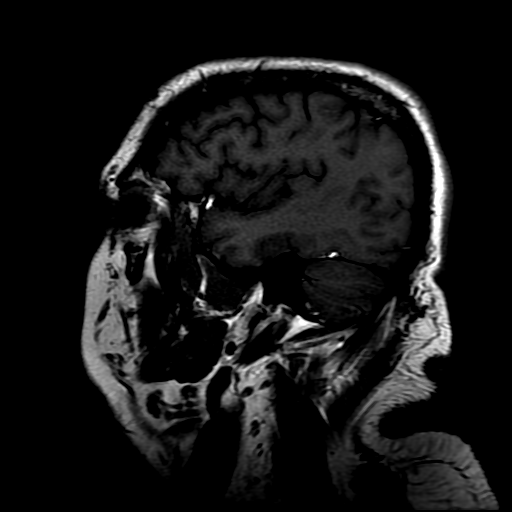

[Series 4: DWI · axial · 5.0mm · 1.09mm/px · z∈[-30,+101]mm · 6 of 50 slices shown (1 of 2)]
[im 1/50]
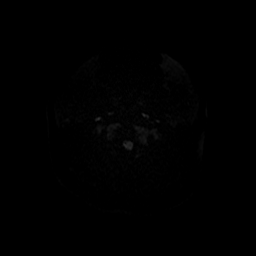
[im 10/50]
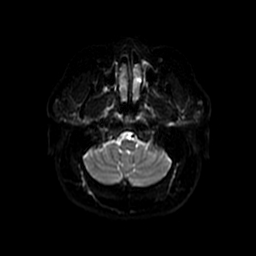
[im 20/50]
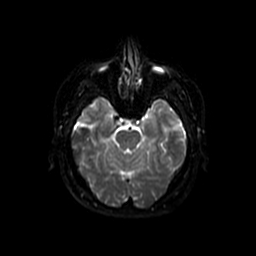
[im 30/50]
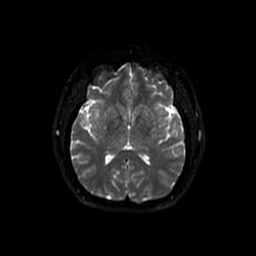
[im 40/50]
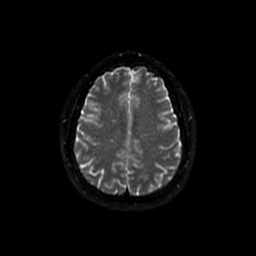
[im 50/50]
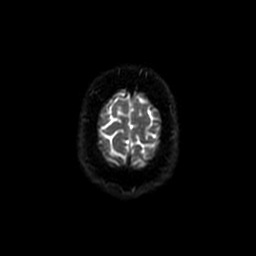

[Series 5: T2 · axial · 5.0mm · 0.43mm/px · z∈[-31,+101]mm · 3 of 20 slices shown (1 of 2)]
[im 1/20]
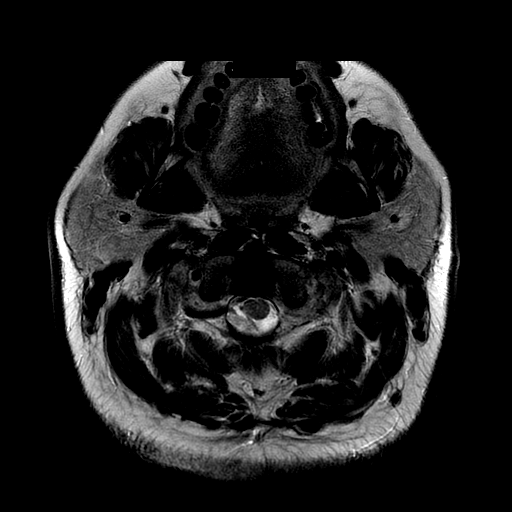
[im 10/20]
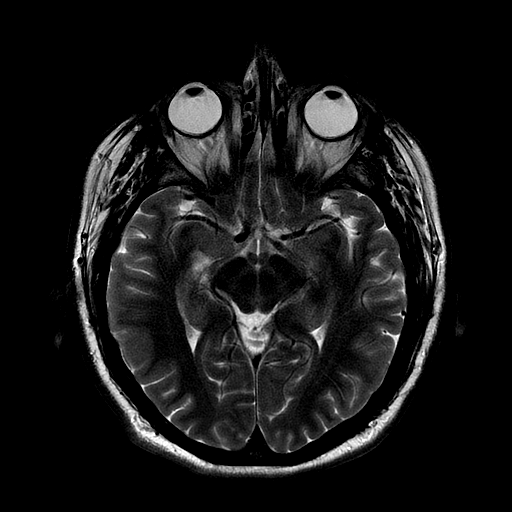
[im 20/20]
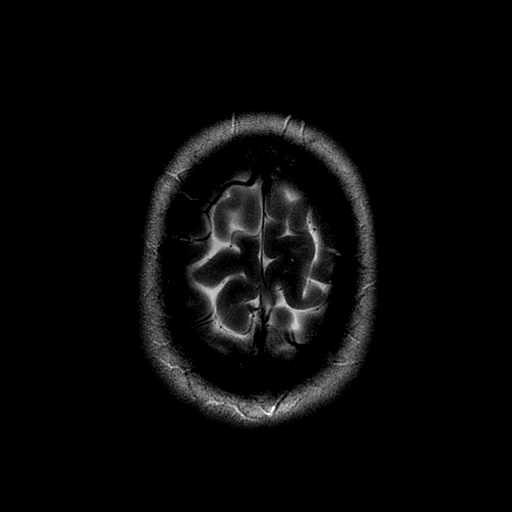

[Series 6: (id) mt fs · axial · 1.4mm · 0.43mm/px · z∈[-27,+34]mm · 6 of 136 slices shown]
[im 8/136]
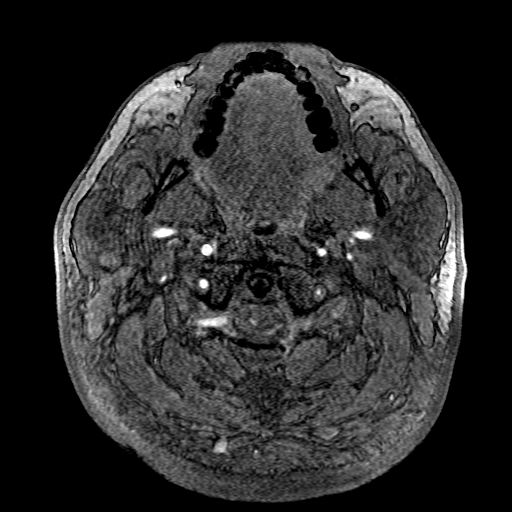
[im 24/136]
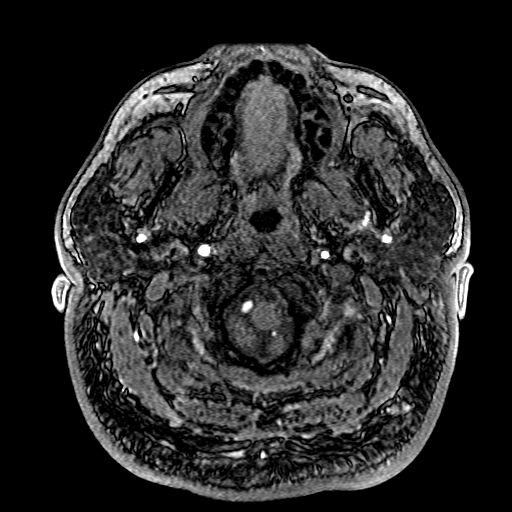
[im 40/136]
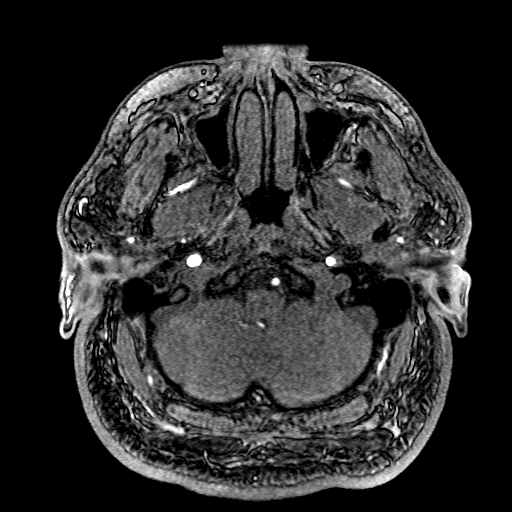
[im 56/136]
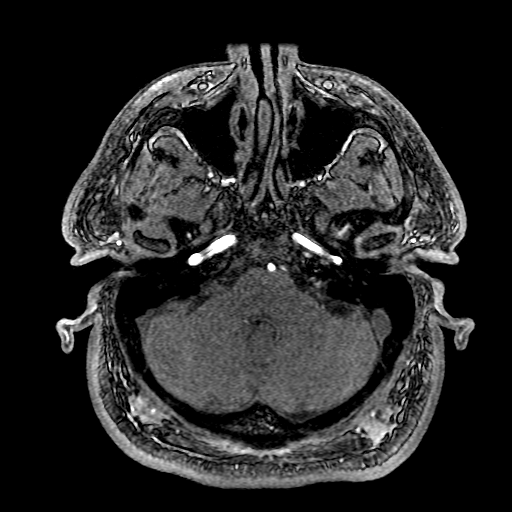
[im 80/136]
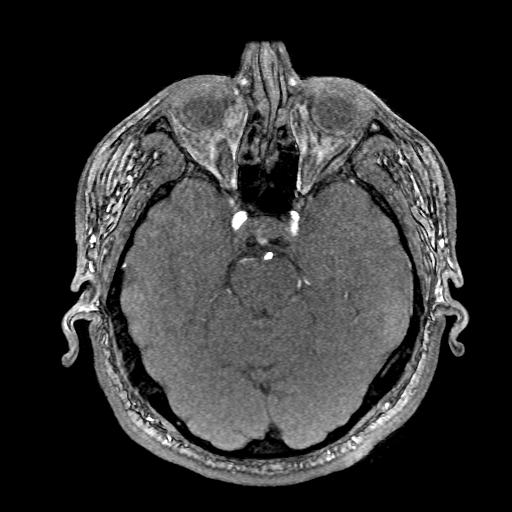
[im 96/136]
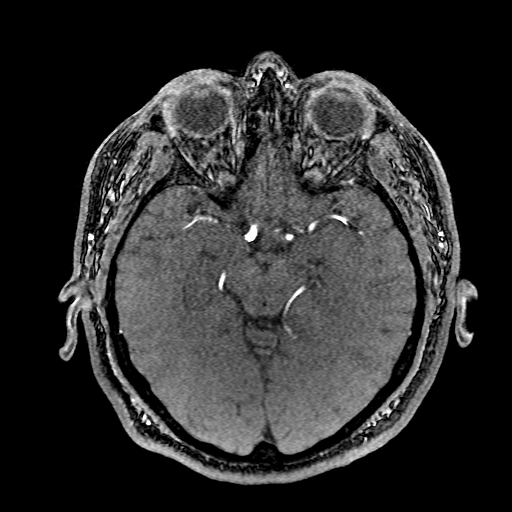

[Series 8: FLAIR · axial · 5.0mm · 0.43mm/px · z∈[-31,+101]mm · 3 of 20 slices shown]
[im 1/20]
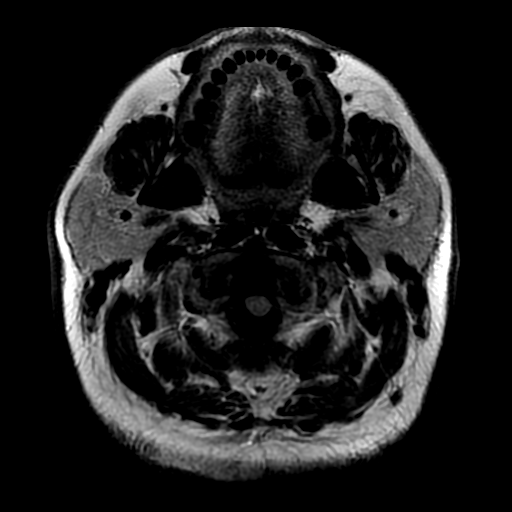
[im 10/20]
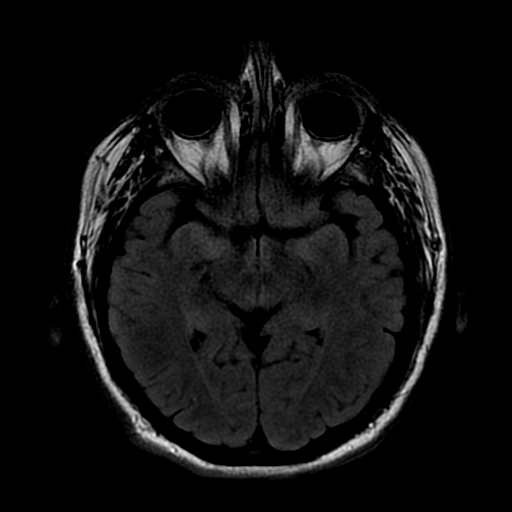
[im 20/20]
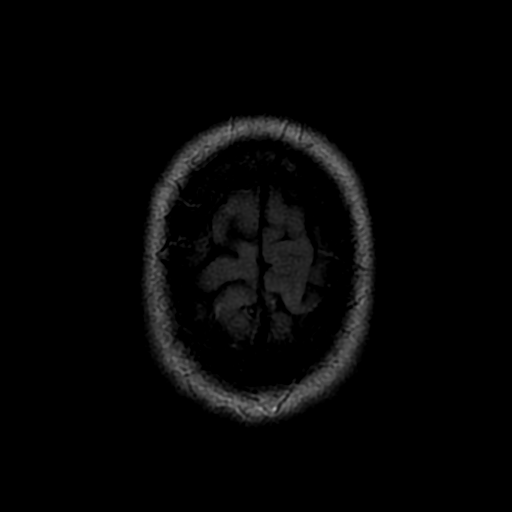

[Series 10: T2 · coronal · 5.0mm · 0.39mm/px · 3 of 22 slices shown (2 of 2)]
[im 1/22]
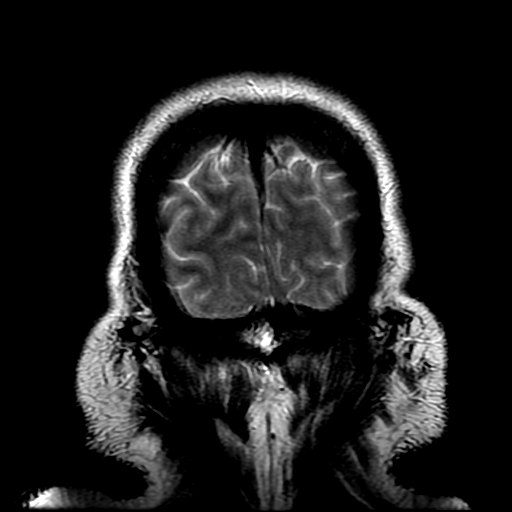
[im 11/22]
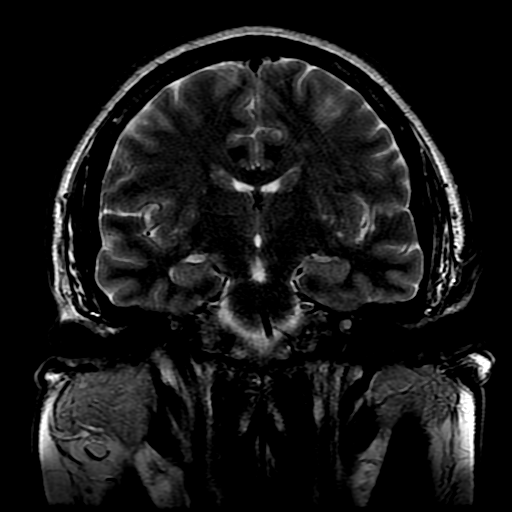
[im 22/22]
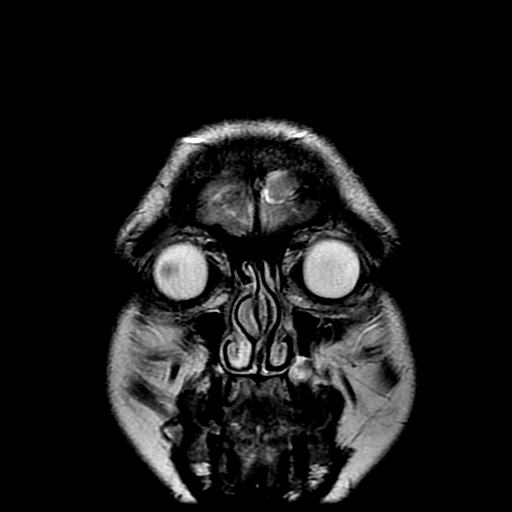

[Series 400: DWI · axial · 5.0mm · 1.09mm/px · z∈[-30,+101]mm · 3 of 25 slices shown (2 of 2)]
[im 1/25]
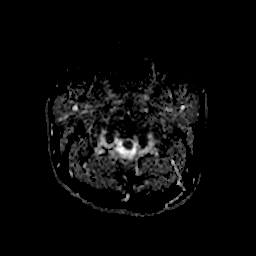
[im 13/25]
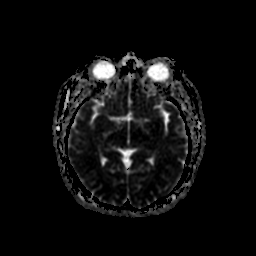
[im 25/25]
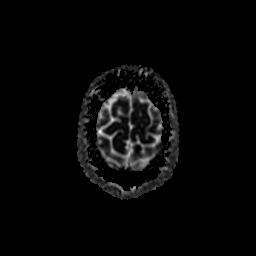

[25 of 48 positions shown; findings below may reference images not displayed]

FINDINGS: Diffusion imaging does not show any acute or subacute
infarction.  The brainstem and cerebellum are unremarkable.  Within
the cerebral hemispheres, there is an old lacunar infarction within
the left thalamus.  There are a few old small vessel changes effect
the basal ganglia and hemispheric deep white matter.  No cortical
or large vessel infarction.  No mass lesion, hemorrhage,
hydrocephalus or extra-axial collection.  No pituitary mass.
Sinuses, middle ears and mastoids are clear.
IMPRESSION: No acute finding.  Chronic small vessel insults throughout the
brain as outlined above.

MRA HEAD
FINDINGS: Both internal carotid arteries are patent into the brain.
The left internal carotid artery is smaller than the right, as it
supplies only the left middle cerebral artery territory.  The right
internal carotid artery supplies the right middle cerebral artery
territory and both anterior cerebral artery territories as well as
giving fetal origin to the right posterior cerebral artery.  Distal
branch vessels show some atherosclerotic irregularity diffusely.

I do not see flow in the distal left vertebral artery.  The right
vertebral artery is widely patent to the basilar.  No basilar
stenosis.  Posterior circulation branch vessels are patent but show
distal atherosclerotic irregularity.

I suspect there is a 2 x 3 mm anterior communicating region
aneurysm.  This has not changed since the previous study.
IMPRESSION: No anterior circulation vascular occlusion.  Anomalous anatomy as
outlined above.  Distal vessel atherosclerotic irregularity.

Chronic occlusion of the left vertebral artery.

2 x 3 mm anterior communicating aneurysm.

## 2011-06-23 IMAGING — CT CT HEAD W/O CM
1 series · 16 of 26 positions shown, 20 images · non-contrast
Comparison: MRI brain 06/09/2008

CLINICAL DATA: Left-sided weakness with headache

CT HEAD WITHOUT CONTRAST
TECHNIQUE: Contiguous axial images were obtained from the base of
the skull through the vertex without contrast.

[Series 2: brain · axial · 0.47mm/px · z∈[+111,+235]mm · 16 of 26 slices shown, 20 images]
[im 2/26  brain]
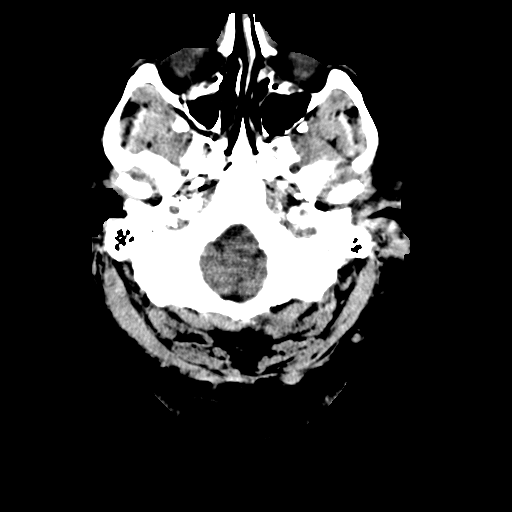
[im 2/26  bone]
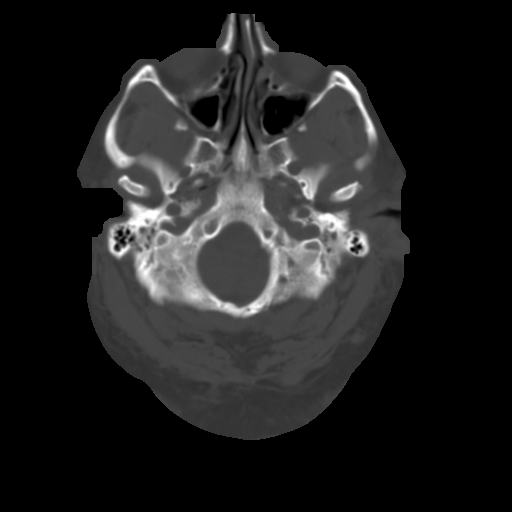
[im 4/26  brain]
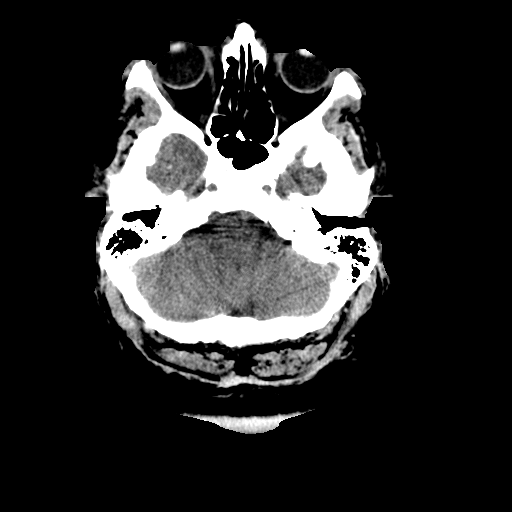
[im 5/26  brain]
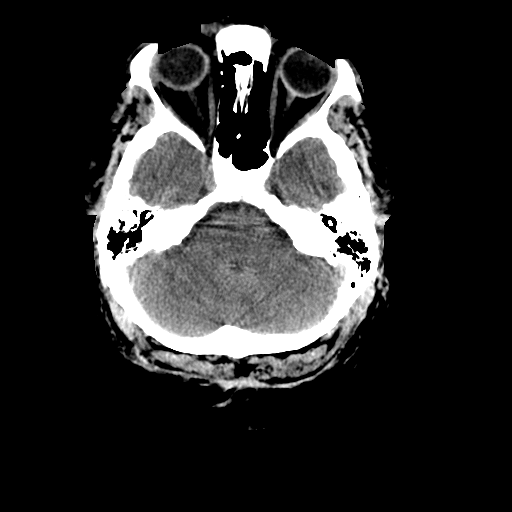
[im 7/26  brain]
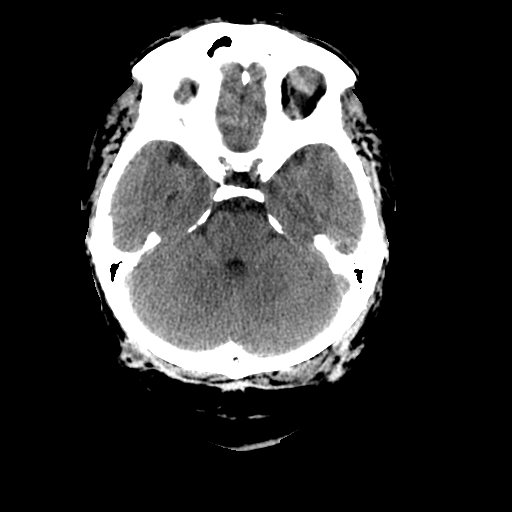
[im 8/26  brain]
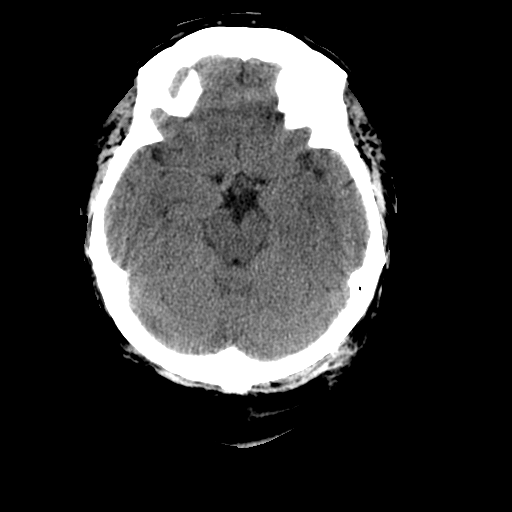
[im 8/26  bone]
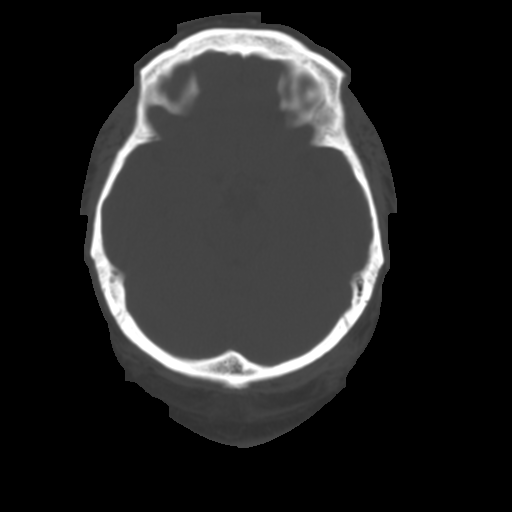
[im 10/26  brain]
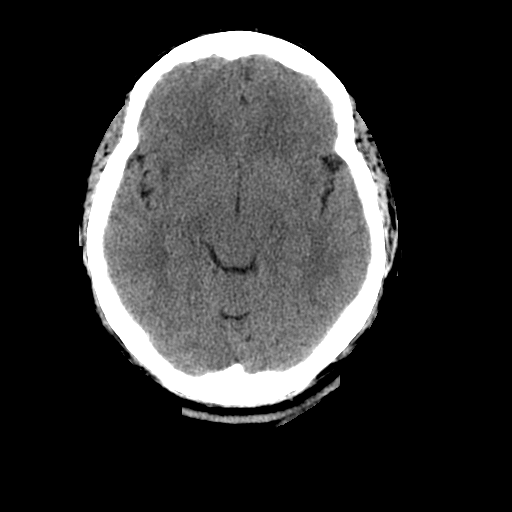
[im 11/26  brain]
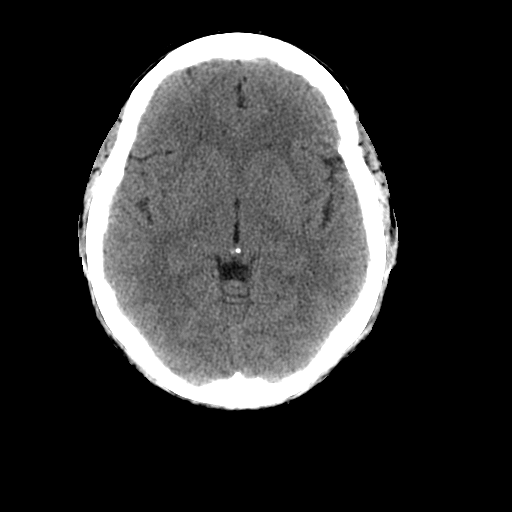
[im 13/26  brain]
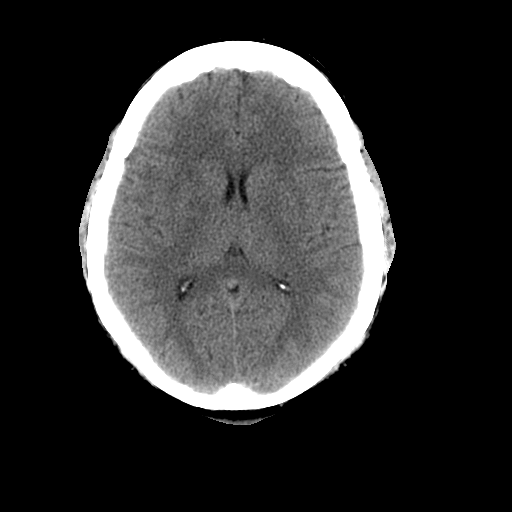
[im 14/26  brain]
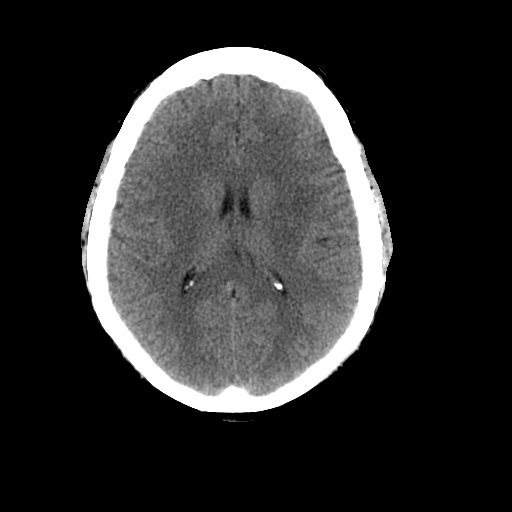
[im 14/26  bone]
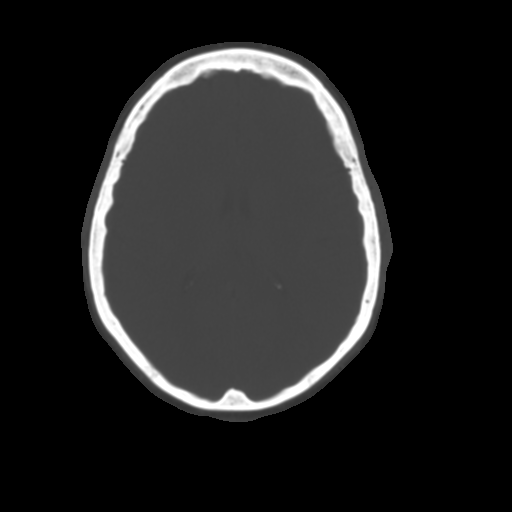
[im 16/26  brain]
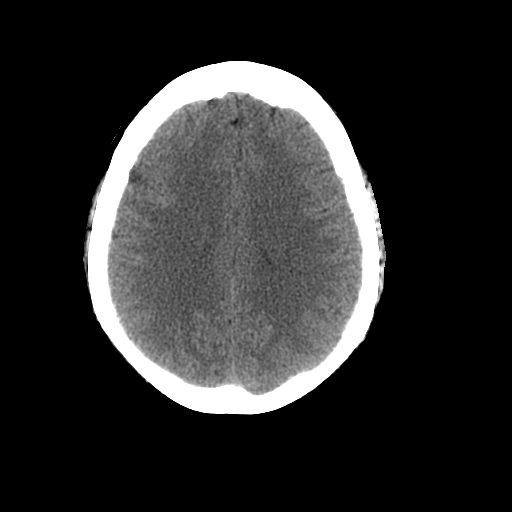
[im 17/26  brain]
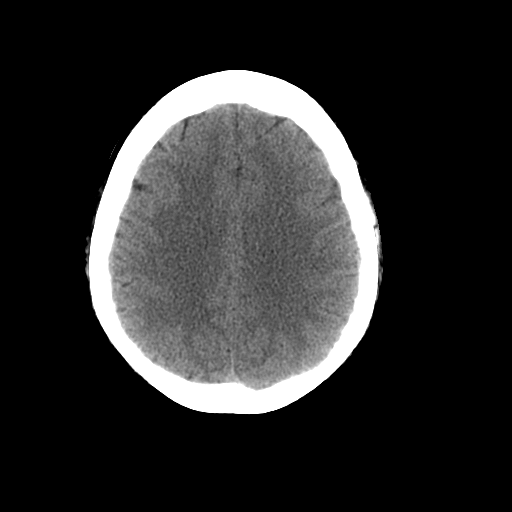
[im 19/26  brain]
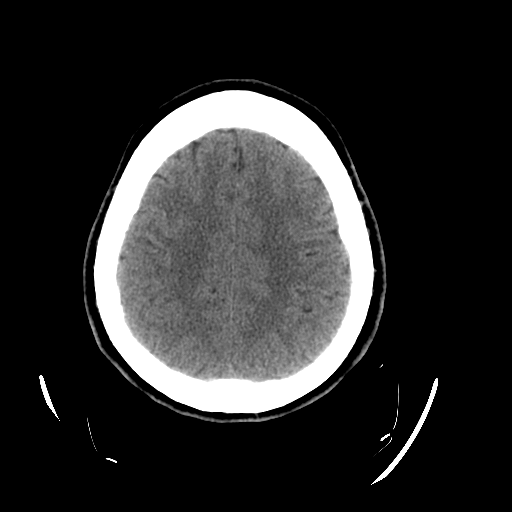
[im 20/26  brain]
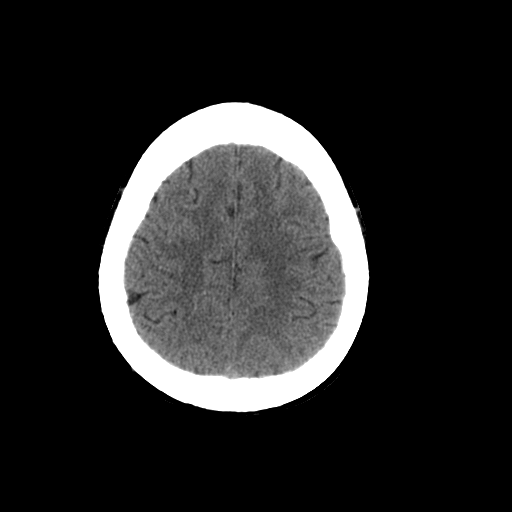
[im 20/26  bone]
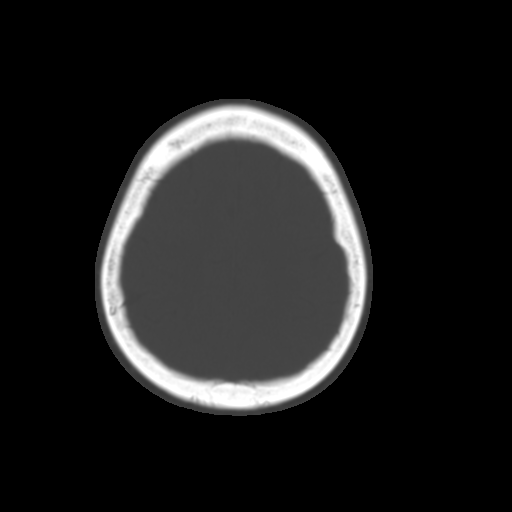
[im 22/26  brain]
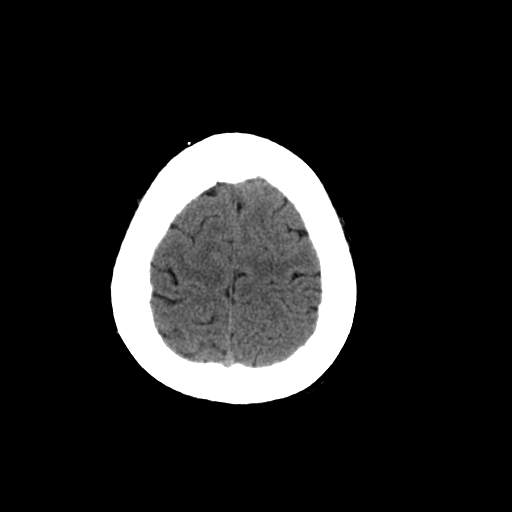
[im 23/26  brain]
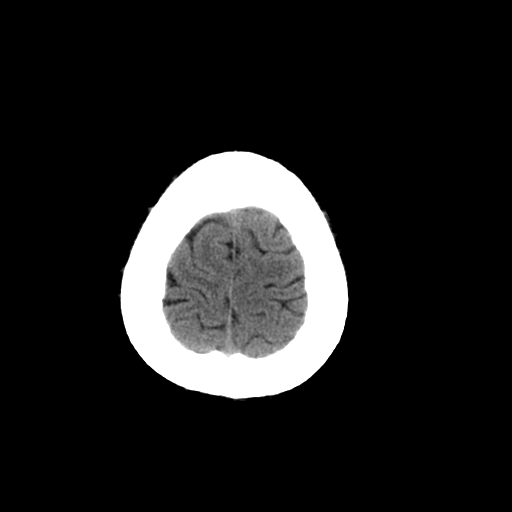
[im 25/26  brain]
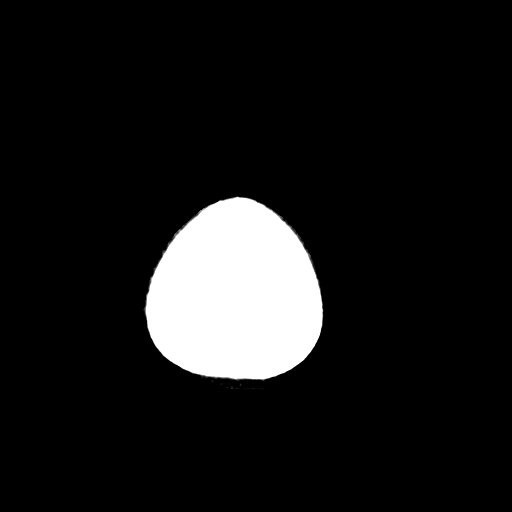

[16 of 26 positions shown; findings below may reference images not displayed]

FINDINGS: There is no evidence for acute infarction, intracranial
hemorrhage, mass lesion, hydrocephalus, or extra-axial fluid. There
is no atrophy or white matter disease.  Previously observed small
acute infarct is difficult to see on today's study.  The calvarium
is intact.  The sinuses and mastoids are clear.
IMPRESSION: Negative study

## 2011-06-23 MED ORDER — TEMAZEPAM 15 MG PO CAPS: 15.0000 mg | ORAL_CAPSULE | Freq: Every evening | ORAL | Status: DC | PRN

## 2011-06-23 NOTE — Telephone Encounter (Signed)
rx sent to pharmacy by e-script  

## 2011-06-24 IMAGING — MR MR [PERSON_NAME] SPINE W/O
4 of 5 series · 19 of 48 positions shown · non-contrast
Comparison: None.

CLINICAL DATA: Left leg pain, tingling, numbness 2 days.

MRI LUMBAR SPINE WITHOUT CONTRAST
TECHNIQUE: Multiplanar and multiecho pulse sequences of the lumbar
spine were obtained without intravenous contrast.

[Series 3: T2 · sagittal · 4.0mm · 0.55mm/px · 5 of 12 slices shown (1 of 2)]
[im 1/12]
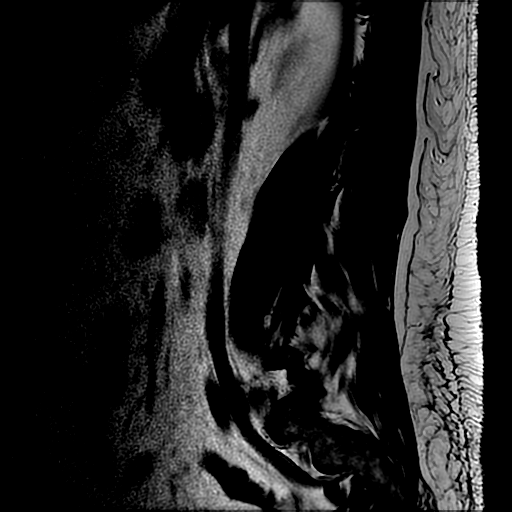
[im 3/12]
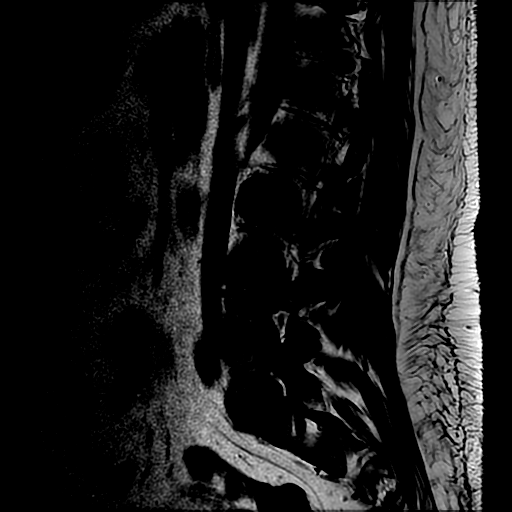
[im 6/12]
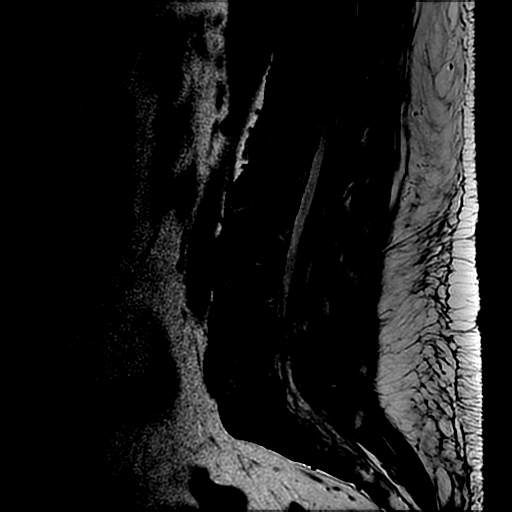
[im 9/12]
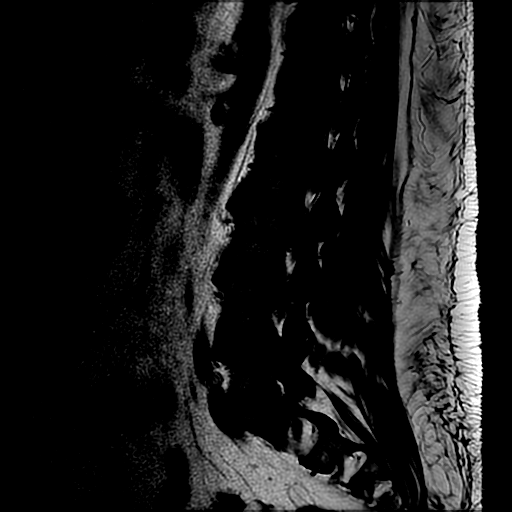
[im 12/12]
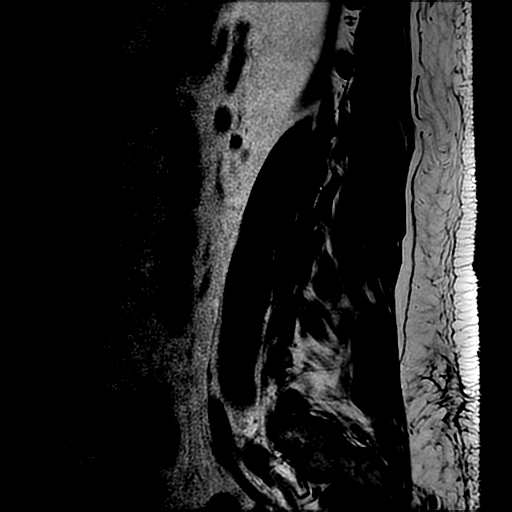

[Series 4: T1 · sagittal · 4.0mm · 0.55mm/px · 3 of 12 slices shown (1 of 2)]
[im 1/12]
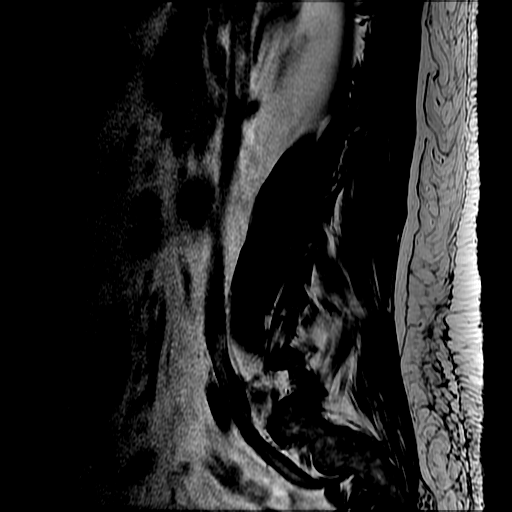
[im 6/12]
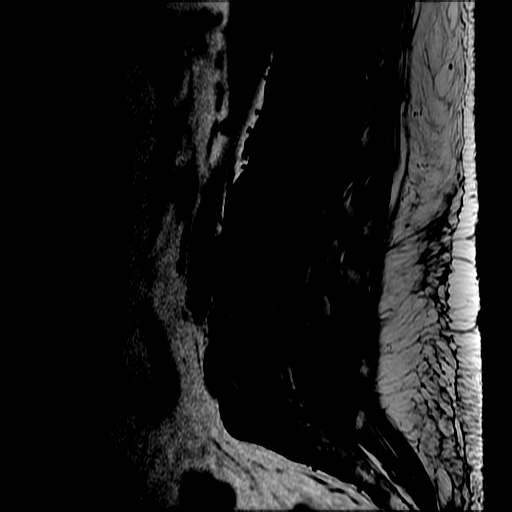
[im 12/12]
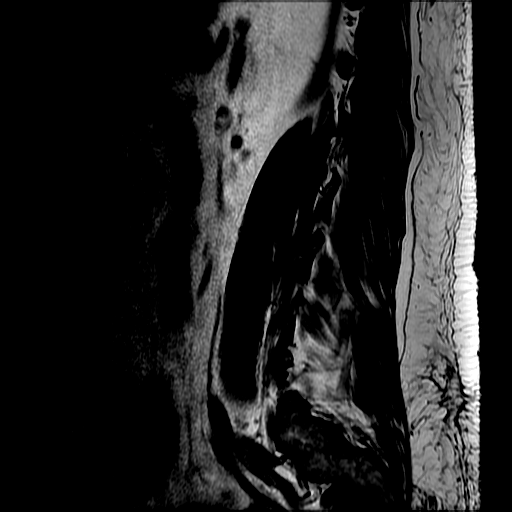

[Series 6: T2 · axial · 4.0mm · 0.39mm/px · z∈[-58,+147]mm · 8 of 46 slices shown (2 of 2)]
[im 3/46]
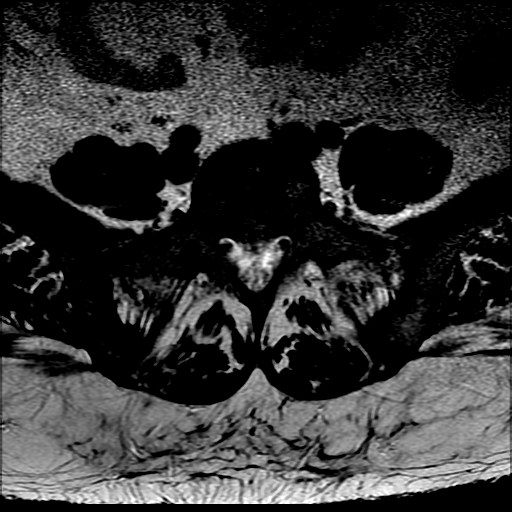
[im 6/46]
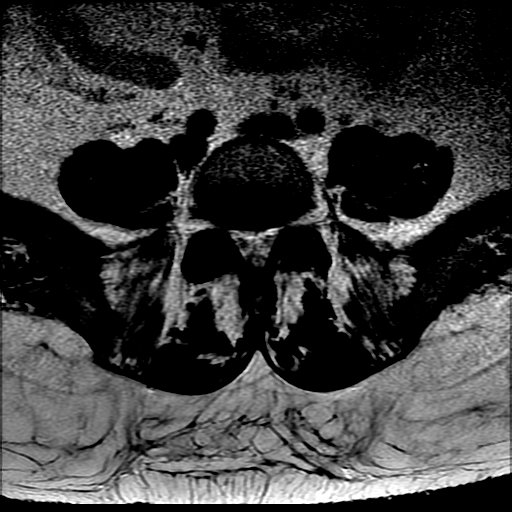
[im 9/46]
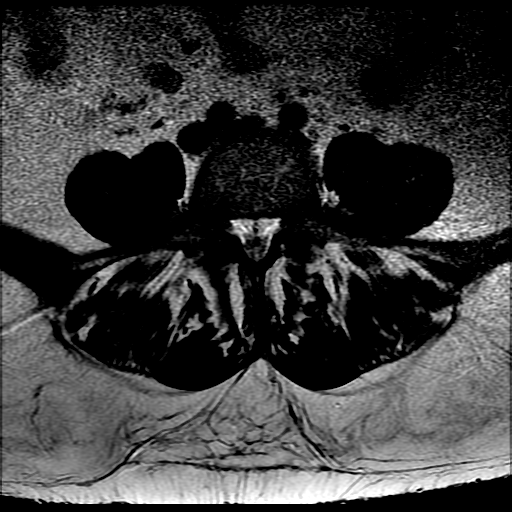
[im 15/46]
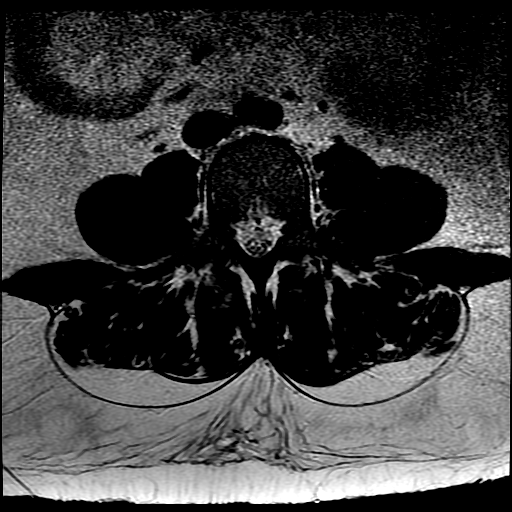
[im 20/46]
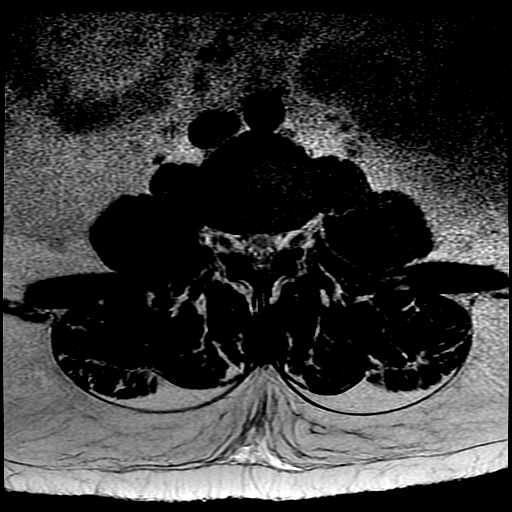
[im 23/46]
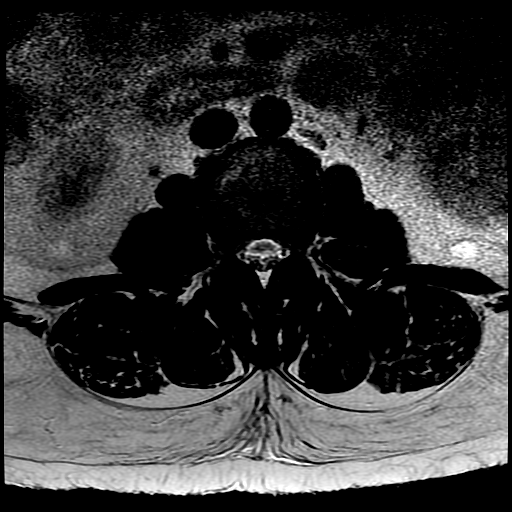
[im 26/46]
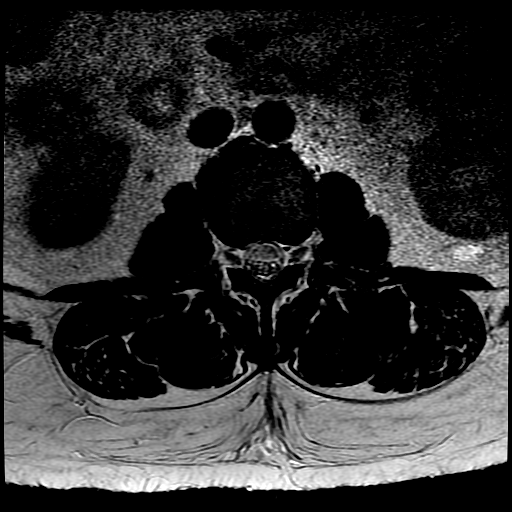
[im 40/46]
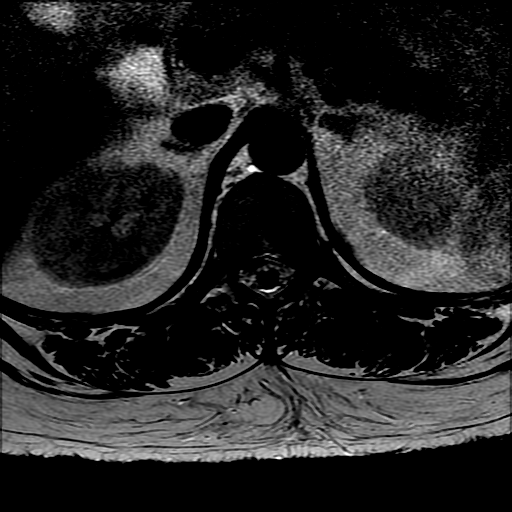

[Series 7: T1 · axial · 4.0mm · 0.39mm/px · z∈[-43,+147]mm · 3 of 46 slices shown (2 of 2)]
[im 6/46]
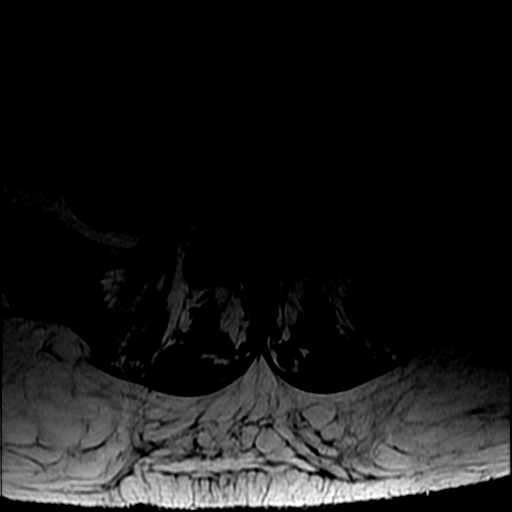
[im 23/46]
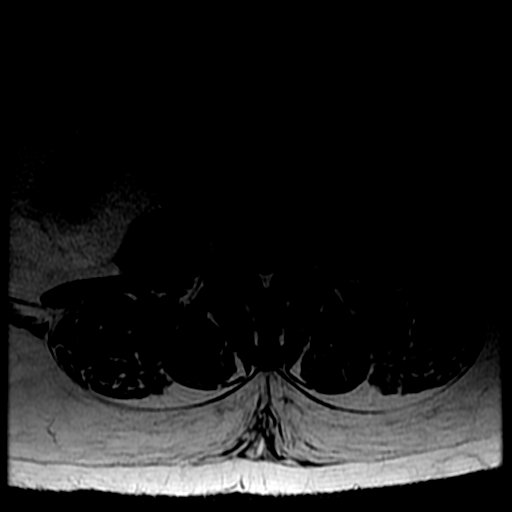
[im 40/46]
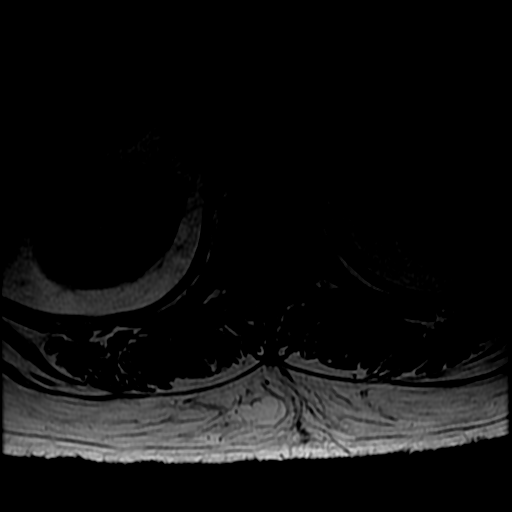

[19 of 48 positions shown; findings below may reference images not displayed]

FINDINGS: Normal lumbar alignment.  Negative for fracture or mass
lesion.  Conus medullaris is normal and terminates at L1-2.

T11-T12:  Small to moderate central disc protrusion with mild
flattening of the cord.  There is disc degeneration and mild facet
degeneration with mild spinal stenosis.

T12-L1:  Disc degeneration without spinal stenosis.

L1-2:  Disc degeneration with Schmorl's nodes.  No significant
spinal stenosis.

L2-3:  Mild disc degeneration.

L3-4:  Mild disc degeneration and facet degeneration with mild
spinal stenosis.

L4-5:  Moderately large central disc protrusion.  There is mild
vertebral spurring.  There is moderate facet and ligamentum flavum
hypertrophy with moderate spinal stenosis.

L5-S1:  Mild disc degeneration and disc bulging.  There is moderate
facet degeneration bilaterally.  Mild to moderate foraminal
encroachment bilaterally due to facet overgrowth.
IMPRESSION: Multilevel degenerative changes as described above.

The most degenerated level is L4-5 where there is a central disc
protrusion and moderate spinal stenosis.

## 2011-07-13 ENCOUNTER — Ambulatory Visit (HOSPITAL_BASED_OUTPATIENT_CLINIC_OR_DEPARTMENT_OTHER): Payer: BC Managed Care – PPO | Attending: Pulmonary Disease | Admitting: General Practice

## 2011-07-13 VITALS — Ht 62.0 in | Wt 195.0 lb

## 2011-07-13 DIAGNOSIS — G4710 Hypersomnia, unspecified: Secondary | ICD-10-CM | POA: Insufficient documentation

## 2011-07-13 DIAGNOSIS — R404 Transient alteration of awareness: Secondary | ICD-10-CM

## 2011-07-13 DIAGNOSIS — G4733 Obstructive sleep apnea (adult) (pediatric): Secondary | ICD-10-CM

## 2011-07-23 ENCOUNTER — Ambulatory Visit: Payer: BC Managed Care – PPO | Admitting: Internal Medicine

## 2011-07-28 ENCOUNTER — Ambulatory Visit: Payer: BC Managed Care – PPO | Admitting: Family Medicine

## 2011-07-29 DIAGNOSIS — G4730 Sleep apnea, unspecified: Secondary | ICD-10-CM

## 2011-07-29 DIAGNOSIS — G4710 Hypersomnia, unspecified: Secondary | ICD-10-CM

## 2011-07-29 NOTE — Procedures (Signed)
NAMESRISHTI, LABRAKE                 ACCOUNT NO.:  [id number]  MEDICAL RECORD NO.:  [id number]          PATIENT TYPE:  OUT  LOCATION:  SLEEP CENTER                 FACILITY:  [facility]  PHYSICIAN:  Kathee Delton, MD,FCCPDATE OF BIRTH:  05/28/68  DATE OF STUDY:  07/13/2011                           NOCTURNAL POLYSOMNOGRAM  REFERRING PHYSICIAN:  Kathee Delton, MD,FCCP  REFERRING PHYSICIAN:  Kathee Delton, MD,FCCP  INDICATIONS FOR STUDY:  Hypersomnia with sleep apnea.  EPWORTH SLEEPINESS SCORE:  22.  SLEEP ARCHITECTURE:  The patient had a total sleep time of 452 minutes with adequate slow-wave sleep for age and also 109 minutes of REM. Sleep onset latency was rapid at 0.5 minutes and REM onset was prolonged at 151 minutes.  Sleep efficiency was excellent at 98%.  RESPIRATORY DATA:  The patient had no obstructive apneas and only 2 obstructive hypopneas, giving her an apnea/hypopnea index of 0.3 events per hour.  The events that occurred were not positional and there was loud snoring noted throughout.  OXYGEN DATA:  There was O2 desaturation as low as 92% with her obstructive events.  CARDIAC DATA:  No clinically significant arrhythmias were noted.  MOVEMENT/PARASOMNIA:  The patient had no significant leg jerks or other abnormal behavior seen.  IMPRESSION/RECOMMENDATION: 1. Small numbers of obstructive events, which do not meet the AHI     criteria for the obstructive sleep apnea syndrome. 2. The patient had no significant sleep abnormality, which explains     her significant daytime sleepiness.  Clinical correlation is     suggested and consideration should be given to a possible daytime     sleep disorders such as narcolepsy or idiopathic hypersomnia.    Kathee Delton, MD,FCCP Diplomate, [place] Board of Sleep Medicine   [facility]/MED[facility]  D:  07/29/2011 08:39:48  T:  07/29/2011 10:06:54  Job:  [id number]

## 2011-08-03 ENCOUNTER — Telehealth: Payer: Self-pay | Admitting: Pulmonary Disease

## 2011-08-03 DIAGNOSIS — G4710 Hypersomnia, unspecified: Secondary | ICD-10-CM

## 2011-08-03 NOTE — Telephone Encounter (Signed)
[name], let pt know that her sleep study did not show sleep apnea or anything else to disrupt her sleep.  I would like to see her back in office to discuss  further workup if she wishes to keep evaluating her daytime sleepiness.

## 2011-08-05 NOTE — Telephone Encounter (Signed)
LMOMTCB x 1 

## 2011-08-05 NOTE — Telephone Encounter (Signed)
Pt made aware and says she is out of town right now and will call to get an appt scheduled once she returns on 08/20/11.

## 2011-09-03 ENCOUNTER — Ambulatory Visit: Payer: BC Managed Care – PPO | Admitting: Family Medicine

## 2011-09-04 ENCOUNTER — Other Ambulatory Visit: Payer: Self-pay | Admitting: Family Medicine

## 2011-09-04 DIAGNOSIS — Z1231 Encounter for screening mammogram for malignant neoplasm of breast: Secondary | ICD-10-CM

## 2011-09-23 ENCOUNTER — Ambulatory Visit: Payer: BC Managed Care – PPO

## 2011-09-30 ENCOUNTER — Ambulatory Visit
Admission: RE | Admit: 2011-09-30 | Discharge: 2011-09-30 | Disposition: A | Discharge status: Home / Self Care | Payer: BC Managed Care – PPO | Source: Ambulatory Visit | Attending: Family Medicine | Admitting: Family Medicine

## 2011-09-30 DIAGNOSIS — Z1231 Encounter for screening mammogram for malignant neoplasm of breast: Secondary | ICD-10-CM

## 2011-10-09 ENCOUNTER — Ambulatory Visit: Payer: BC Managed Care – PPO | Admitting: Pulmonary Disease

## 2011-11-04 IMAGING — CR Imaging study
3 series · 3 of 3 positions shown · non-contrast
Comparison: None.

CLINICAL DATA: Fell, pain

LEFT SHOULDER - 2+ VIEW

[view not recorded (1 of 3)]
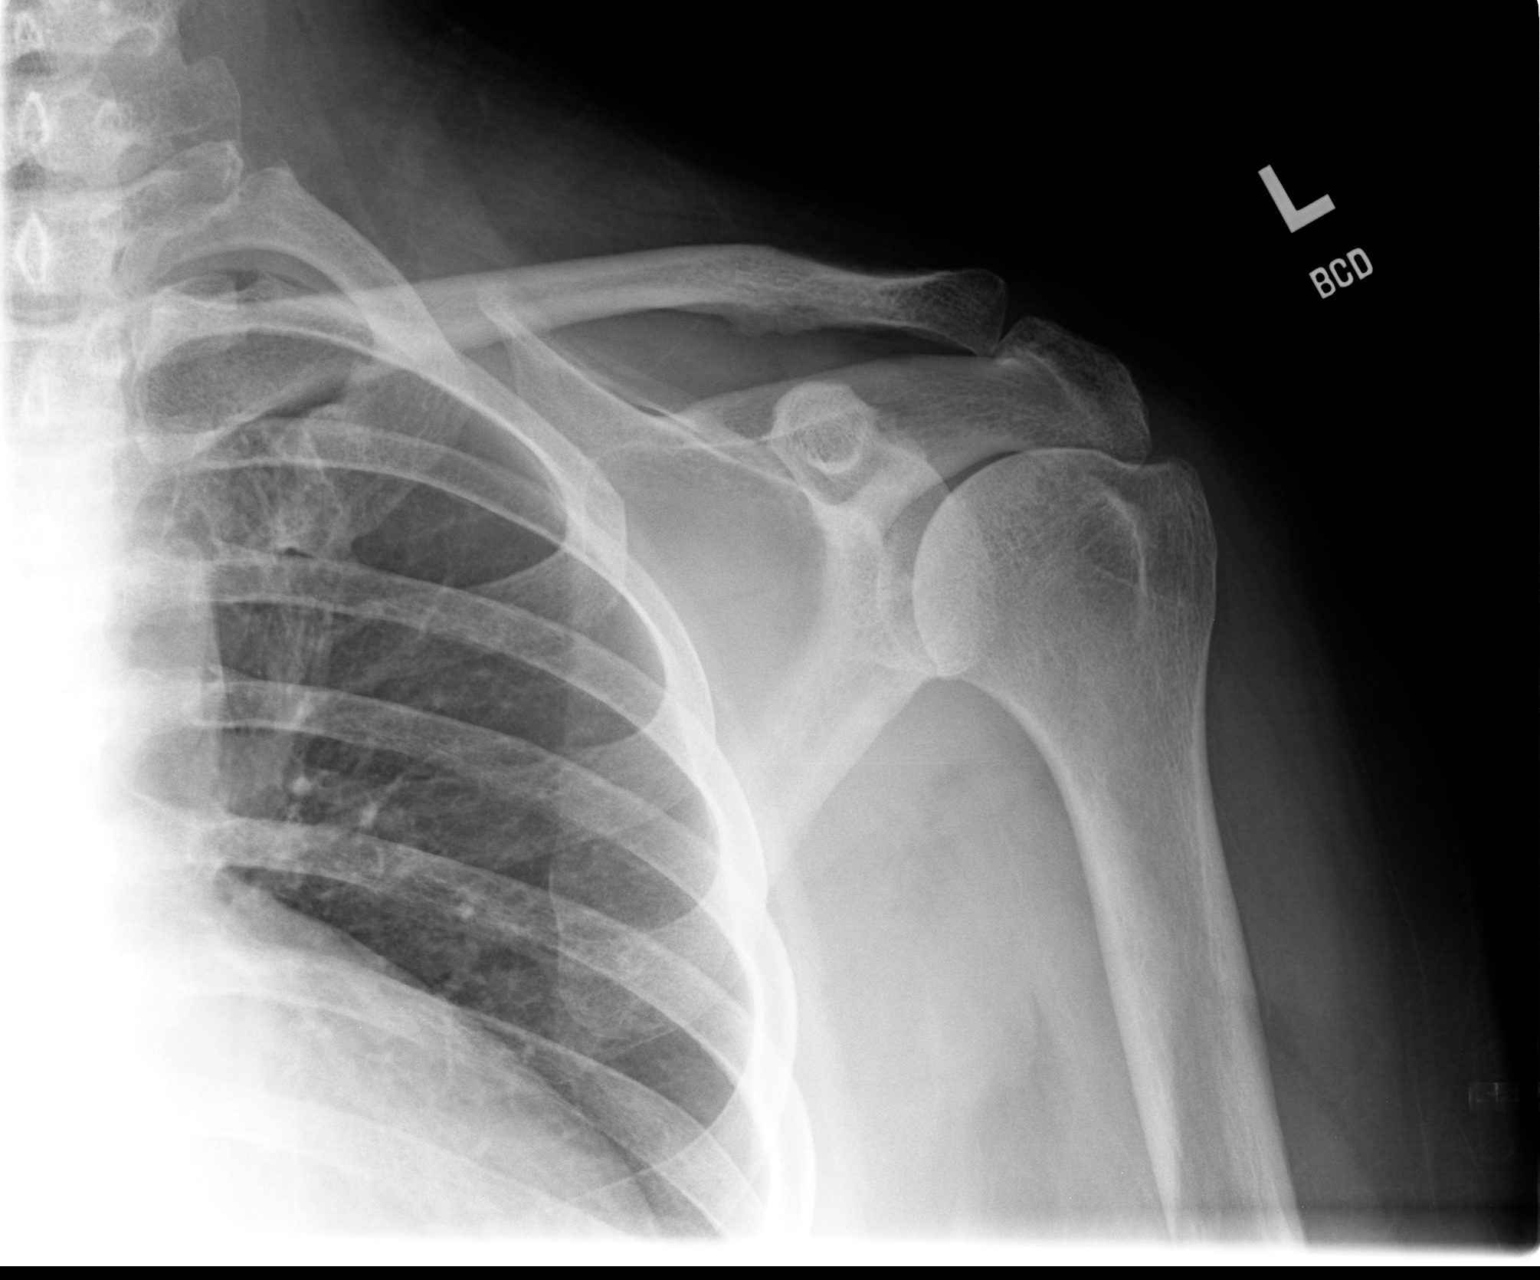

[view not recorded (2 of 3)]
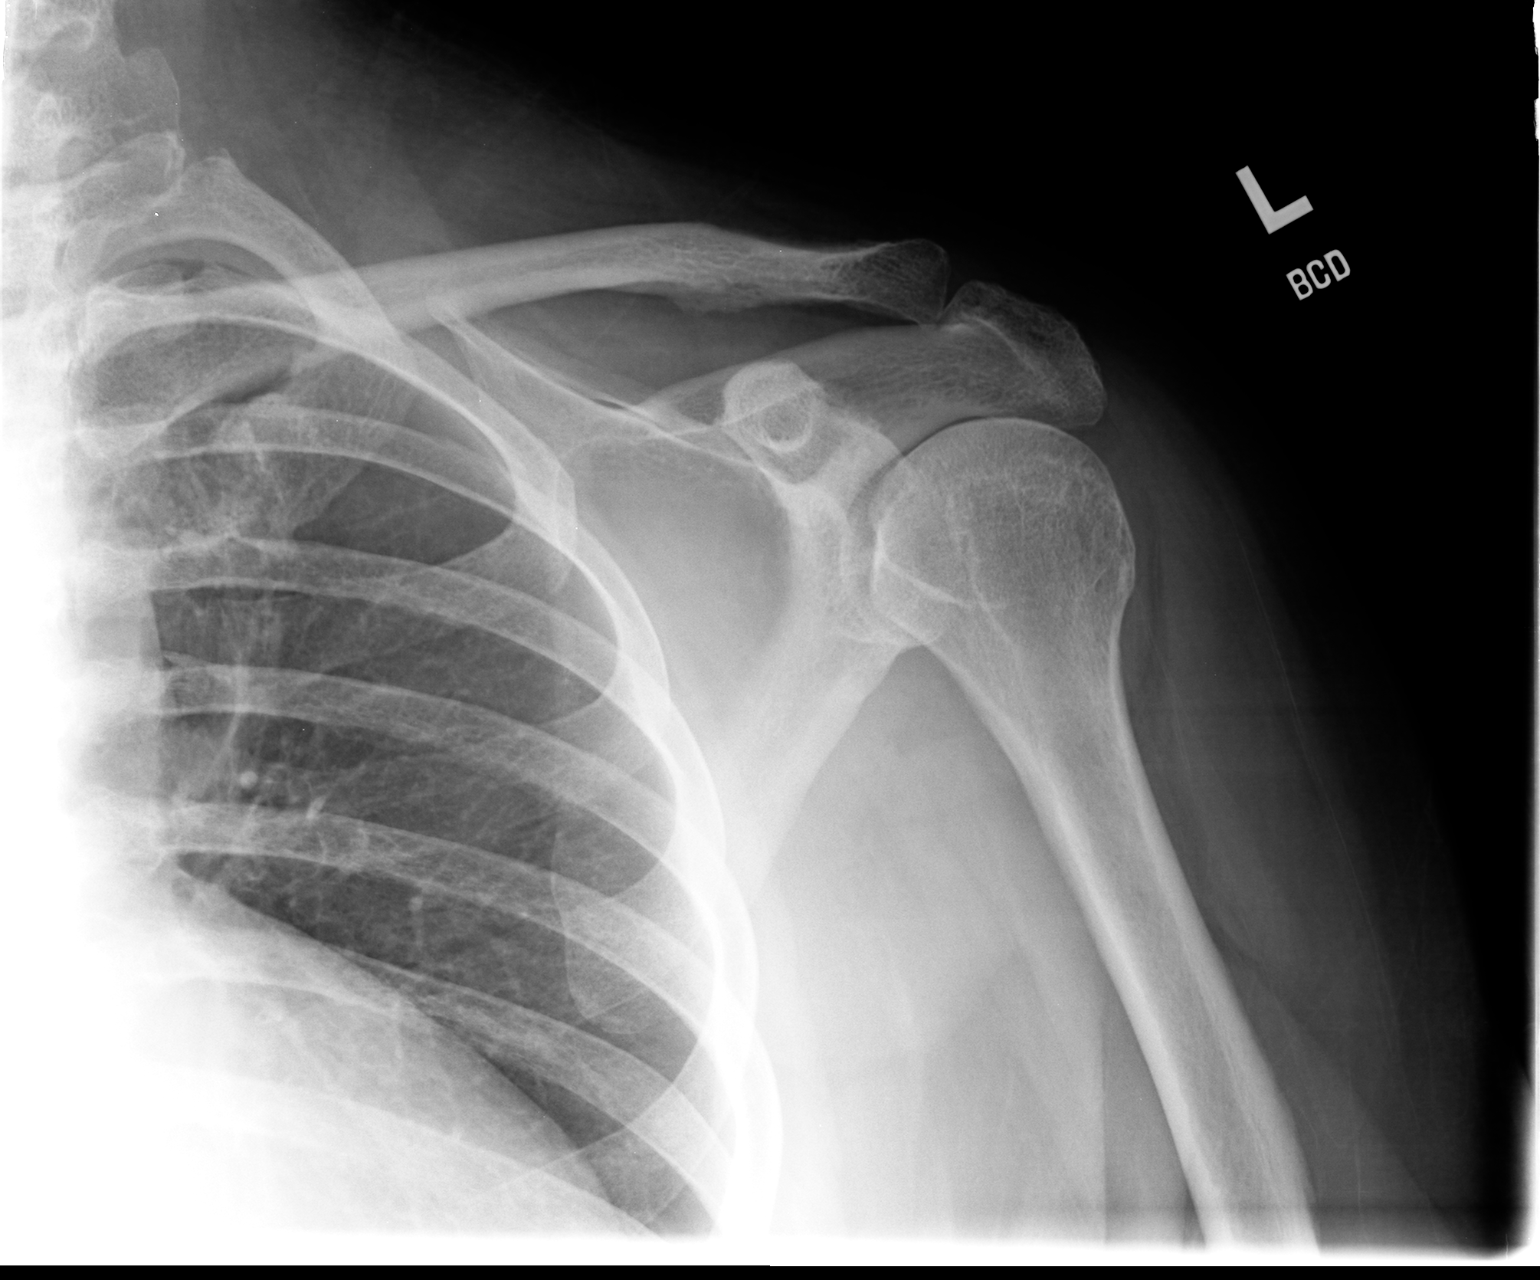

[view not recorded (3 of 3)]
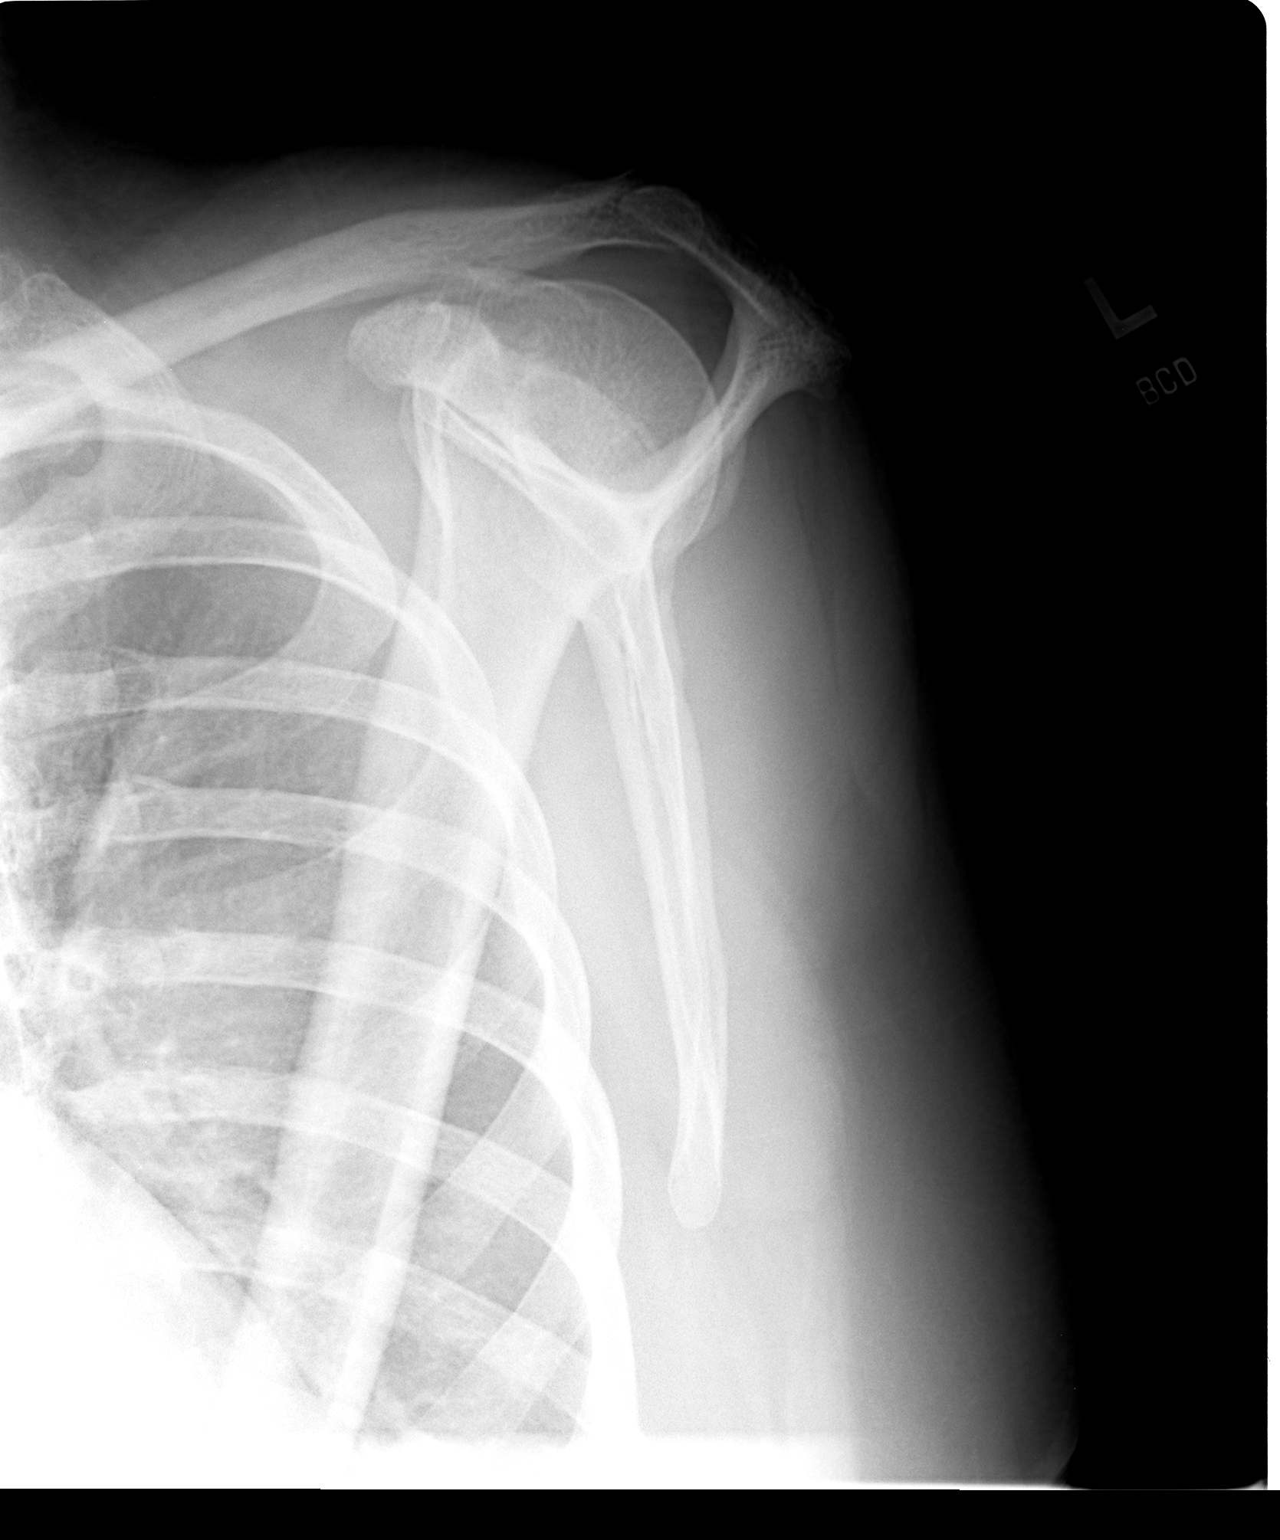

[3 of 3 positions shown; findings below may reference images not displayed]

FINDINGS: There is no evidence of fracture or dislocation.  There
is no evidence of arthropathy or other focal bone abnormality.
Soft tissues are unremarkable.
IMPRESSION: Negative.

## 2012-01-31 ENCOUNTER — Emergency Department: Payer: Self-pay | Admitting: Emergency Medicine

## 2012-01-31 LAB — COMPREHENSIVE METABOLIC PANEL
Albumin: 3.8 g/dL (ref 3.4–5.0)
BUN: 11 mg/dL (ref 7–18)
Calcium, Total: 9.2 mg/dL (ref 8.5–10.1)
Chloride: 105 mmol/L (ref 98–107)
EGFR (African American): >60
EGFR (Non-African Amer.): >60
Glucose: 335 mg/dL — ABNORMAL HIGH (ref 65–99)
SGOT(AST): 34 U/L (ref 15–37)
SGPT (ALT): 48 U/L (ref 12–78)
Total Protein: 7.5 g/dL (ref 6.4–8.2)

## 2012-01-31 LAB — URINALYSIS, COMPLETE
Bilirubin,UR: NEGATIVE
Blood: NEGATIVE
Ketone: NEGATIVE
Ph: 6 (ref 4.5–8.0)
RBC,UR: <1 /HPF (ref 0–5)
Specific Gravity: 1.007 (ref 1.003–1.030)
Squamous Epithelial: 2

## 2012-01-31 LAB — CBC
HCT: 37.3 % (ref 35.0–47.0)
HGB: 12 g/dL (ref 12.0–16.0)
MCH: 23.7 pg — ABNORMAL LOW (ref 26.0–34.0)
MCHC: 32.1 g/dL (ref 32.0–36.0)
MCV: 74 fL — ABNORMAL LOW (ref 80–100)
Platelet: 273 10*3/uL (ref 150–440)
RBC: 5.05 10*6/uL (ref 3.80–5.20)
WBC: 10 10*3/uL (ref 3.6–11.0)

## 2012-01-31 LAB — LIPASE, BLOOD: Lipase: 195 U/L (ref 73–393)

## 2012-02-01 LAB — URINE CULTURE

## 2012-02-02 ENCOUNTER — Telehealth: Payer: Self-pay | Admitting: Family Medicine

## 2012-02-02 DIAGNOSIS — G629 Polyneuropathy, unspecified: Secondary | ICD-10-CM

## 2012-02-02 MED ORDER — GABAPENTIN 300 MG PO CAPS: 300.0000 mg | ORAL_CAPSULE | Freq: Three times a day (TID) | ORAL | Status: DC

## 2012-02-02 NOTE — Telephone Encounter (Signed)
refill Gabapentin (Cap) 300 MG Take 1 capsule (300 mg total) by mouth 3 (three) times daily.  #270 last fill 11.29.13, last ov wt/labs 4.17.13

## 2012-02-02 NOTE — Telephone Encounter (Signed)
Refill for neurontin sent to [facility] pharmacy

## 2012-02-03 DIAGNOSIS — Z1371 Encounter for nonprocreative screening for genetic disease carrier status: Secondary | ICD-10-CM

## 2012-02-03 HISTORY — DX: Encounter for nonprocreative screening for genetic disease carrier status: Z13.71

## 2012-02-10 ENCOUNTER — Emergency Department: Payer: Self-pay

## 2012-02-10 LAB — COMPREHENSIVE METABOLIC PANEL
Albumin: 3.7 g/dL (ref 3.4–5.0)
Alkaline Phosphatase: 80 U/L (ref 50–136)
Anion Gap: 12 (ref 7–16)
BUN: 11 mg/dL (ref 7–18)
Bilirubin,Total: 0.2 mg/dL (ref 0.2–1.0)
Calcium, Total: 8.3 mg/dL — ABNORMAL LOW (ref 8.5–10.1)
Co2: 19 mmol/L — ABNORMAL LOW (ref 21–32)
Creatinine: 1.36 mg/dL — ABNORMAL HIGH (ref 0.60–1.30)
EGFR (African American): 55 — ABNORMAL LOW
EGFR (Non-African Amer.): 48 — ABNORMAL LOW
Glucose: 349 mg/dL — ABNORMAL HIGH (ref 65–99)
Osmolality: 287 (ref 275–301)
Potassium: 4.1 mmol/L (ref 3.5–5.1)
SGOT(AST): 36 U/L (ref 15–37)
SGPT (ALT): 42 U/L (ref 12–78)
Sodium: 137 mmol/L (ref 136–145)
Total Protein: 7.1 g/dL (ref 6.4–8.2)

## 2012-02-10 LAB — TROPONIN I: Troponin-I: <0.02 ng/mL

## 2012-02-10 LAB — URINALYSIS, COMPLETE
Blood: NEGATIVE
Glucose,UR: >=500 mg/dL (ref 0–75)
Protein: NEGATIVE
Specific Gravity: 1.003 (ref 1.003–1.030)
Squamous Epithelial: <1
WBC UR: NONE SEEN /HPF (ref 0–5)

## 2012-02-10 LAB — CBC
HCT: 35.2 % (ref 35.0–47.0)
MCHC: 32.3 g/dL (ref 32.0–36.0)
RBC: 4.81 10*6/uL (ref 3.80–5.20)
RDW: 13.9 % (ref 11.5–14.5)
WBC: 9.2 10*3/uL (ref 3.6–11.0)

## 2012-02-10 LAB — TSH: Thyroid Stimulating Horm: 1.85 u[IU]/mL

## 2012-02-15 ENCOUNTER — Emergency Department: Payer: Self-pay | Admitting: Emergency Medicine

## 2012-02-15 LAB — URINALYSIS, COMPLETE
Bilirubin,UR: NEGATIVE
Blood: NEGATIVE
Glucose,UR: NEGATIVE mg/dL (ref 0–75)
Ketone: NEGATIVE
Leukocyte Esterase: NEGATIVE
Nitrite: NEGATIVE
Ph: 6 (ref 4.5–8.0)
Protein: NEGATIVE
RBC,UR: NONE SEEN /HPF (ref 0–5)
Squamous Epithelial: <1
WBC UR: 1 /HPF (ref 0–5)

## 2012-02-15 LAB — COMPREHENSIVE METABOLIC PANEL
Alkaline Phosphatase: 87 U/L (ref 50–136)
Anion Gap: 5 — ABNORMAL LOW (ref 7–16)
BUN: 5 mg/dL — ABNORMAL LOW (ref 7–18)
Bilirubin,Total: 0.3 mg/dL (ref 0.2–1.0)
Calcium, Total: 9 mg/dL (ref 8.5–10.1)
Chloride: 108 mmol/L — ABNORMAL HIGH (ref 98–107)
Co2: 27 mmol/L (ref 21–32)
Creatinine: 1.04 mg/dL (ref 0.60–1.30)
EGFR (African American): >60
Glucose: 161 mg/dL — ABNORMAL HIGH (ref 65–99)
Osmolality: 280 (ref 275–301)
Potassium: 4.1 mmol/L (ref 3.5–5.1)
SGOT(AST): 25 U/L (ref 15–37)
SGPT (ALT): 40 U/L (ref 12–78)
Total Protein: 7.4 g/dL (ref 6.4–8.2)

## 2012-02-15 LAB — CBC
HGB: 11.8 g/dL — ABNORMAL LOW (ref 12.0–16.0)
MCH: 23.5 pg — ABNORMAL LOW (ref 26.0–34.0)
MCHC: 32.3 g/dL (ref 32.0–36.0)
Platelet: 261 10*3/uL (ref 150–440)

## 2012-03-19 ENCOUNTER — Other Ambulatory Visit: Payer: Self-pay

## 2012-04-28 ENCOUNTER — Other Ambulatory Visit: Payer: Self-pay | Admitting: Family Medicine

## 2012-05-16 ENCOUNTER — Telehealth: Payer: Self-pay

## 2012-05-16 MED ORDER — OMEPRAZOLE 40 MG PO CPDR: 40.0000 mg | DELAYED_RELEASE_CAPSULE | Freq: Two times a day (BID) | ORAL | Status: DC

## 2012-05-16 NOTE — Telephone Encounter (Signed)
Refilled Omeprazole 

## 2012-05-18 ENCOUNTER — Telehealth: Payer: Self-pay

## 2012-05-18 NOTE — Telephone Encounter (Signed)
Called pharmacy and verbally refilled Omeprazole - electronic rx did not go through

## 2012-05-24 ENCOUNTER — Ambulatory Visit: Payer: Self-pay | Admitting: Family Medicine

## 2012-05-25 ENCOUNTER — Inpatient Hospital Stay: Payer: Self-pay | Admitting: Internal Medicine

## 2012-05-25 ENCOUNTER — Telehealth: Payer: Self-pay | Admitting: *Deleted

## 2012-05-25 LAB — BASIC METABOLIC PANEL
Anion Gap: 6 — ABNORMAL LOW (ref 7–16)
Calcium, Total: 8.9 mg/dL (ref 8.5–10.1)
Chloride: 111 mmol/L — ABNORMAL HIGH (ref 98–107)
EGFR (African American): 51 — ABNORMAL LOW
Glucose: 85 mg/dL (ref 65–99)
Potassium: 4.5 mmol/L (ref 3.5–5.1)

## 2012-05-25 LAB — CBC
HCT: 32.7 % — ABNORMAL LOW (ref 35.0–47.0)
HGB: 10.8 g/dL — ABNORMAL LOW (ref 12.0–16.0)
MCH: 25.5 pg — ABNORMAL LOW (ref 26.0–34.0)
MCV: 77 fL — ABNORMAL LOW (ref 80–100)
RBC: 4.23 10*6/uL (ref 3.80–5.20)
WBC: 10.2 10*3/uL (ref 3.6–11.0)

## 2012-05-25 LAB — URINALYSIS, COMPLETE
Ketone: NEGATIVE
Ph: 5 (ref 4.5–8.0)
Protein: NEGATIVE
WBC UR: 1 /HPF (ref 0–5)

## 2012-05-25 LAB — TROPONIN I: Troponin-I: <0.02 ng/mL

## 2012-05-25 NOTE — Telephone Encounter (Signed)
Per DR. Athar work-in doctor, the patient  needs to go to urgent care if her pain level is at a ten. Patient has not been seen by Dr. Leonie Man since 2011 i let her know she needs a referral and we can get her in if she can wait  to go to  urgent care.

## 2012-05-25 NOTE — Telephone Encounter (Signed)
Patient  has been having severe headaches for a month, she went to her primary dr[facility]. He gave her a mri which only showed inflamed blood vessels patient tried excedrin migraine, tylenol Advil ibuprofen. Primary wants to put her on a prednisone pack patient. Today her headache is unbearable and wants to be seen. Light sensitive nose sensitive. Please contact [phone] please advise

## 2012-05-25 NOTE — Telephone Encounter (Signed)
Message copied by Joellen Jersey on Wed May 25, 2012  9:50 AM ------      Message from: Lawerance Sabal      Created: Wed May 25, 2012  9:21 AM      Contact: patient        Patient calling to see if she can come in today to see Dr. Leonie Man.  She's having HA's over x 1 month -- severe pain in the back of her head.  She would like to come in today if at all possible.              [facility]  ------

## 2012-05-26 DIAGNOSIS — I6789 Other cerebrovascular disease: Secondary | ICD-10-CM

## 2012-05-26 LAB — LIPID PANEL: VLDL Cholesterol, Calc: 70 mg/dL — ABNORMAL HIGH (ref 5–40)

## 2012-05-26 LAB — HEMOGLOBIN A1C: Hemoglobin A1C: 7.7 % — ABNORMAL HIGH (ref 4.2–6.3)

## 2012-06-01 ENCOUNTER — Telehealth: Payer: Self-pay | Admitting: Neurology

## 2012-06-01 NOTE — Telephone Encounter (Signed)
Message copied by Bridgett Larsson on Wed Jun 01, 2012  9:30 AM ------      Message from: Esperanza Sheets A      Created: Mon May 30, 2012  4:39 PM      Contact: Velva Harman, from [place] called and said that the Dr. There said that [facility] needs to be seen soon, having severe headaches. Please call Judson Roch  [phone] Ext: [phone]                  Thanks ------

## 2012-06-01 NOTE — Telephone Encounter (Signed)
Judson Roch, from [place] re: common pt.  I called and LMVM for her to return call.

## 2012-06-01 NOTE — Telephone Encounter (Signed)
Spoke to Judson Roch at [place], she said the patient had been having headaches and left sided numbness.  I told her the patient had not been seen since 2011 and that we needed a new patient referral.  She will send referral and we will get her in asap.  I asked her about a call for a MRI and she had no knowledge of what was needed.

## 2012-06-01 NOTE — Telephone Encounter (Signed)
ok 

## 2012-06-01 NOTE — Telephone Encounter (Signed)
I have faxed MRI and other studies from 2011 on patient, confirmation received.

## 2012-06-01 NOTE — Telephone Encounter (Signed)
Message copied by Bridgett Larsson on Wed Jun 01, 2012 11:11 AM ------      Message from: Lawerance Sabal      Created: Mon May 30, 2012  3:03 PM      Contact: Johnson M.D.'s assistant       Please return their call for MRI information on mutual patient call back number is:  [phone]  ------

## 2012-06-03 ENCOUNTER — Ambulatory Visit (INDEPENDENT_AMBULATORY_CARE_PROVIDER_SITE_OTHER): Payer: BC Managed Care – PPO | Admitting: Neurology

## 2012-06-03 ENCOUNTER — Encounter: Payer: Self-pay | Admitting: Neurology

## 2012-06-03 VITALS — BP 128/78 | HR 69 | Ht 62.0 in | Wt 191.0 lb

## 2012-06-03 DIAGNOSIS — I10 Essential (primary) hypertension: Secondary | ICD-10-CM

## 2012-06-03 DIAGNOSIS — K635 Polyp of colon: Secondary | ICD-10-CM

## 2012-06-03 DIAGNOSIS — R531 Weakness: Secondary | ICD-10-CM

## 2012-06-03 DIAGNOSIS — E669 Obesity, unspecified: Secondary | ICD-10-CM

## 2012-06-03 DIAGNOSIS — R200 Anesthesia of skin: Secondary | ICD-10-CM | POA: Insufficient documentation

## 2012-06-03 DIAGNOSIS — I219 Acute myocardial infarction, unspecified: Secondary | ICD-10-CM

## 2012-06-03 DIAGNOSIS — R4789 Other speech disturbances: Secondary | ICD-10-CM

## 2012-06-03 DIAGNOSIS — R209 Unspecified disturbances of skin sensation: Secondary | ICD-10-CM

## 2012-06-03 DIAGNOSIS — R5381 Other malaise: Secondary | ICD-10-CM

## 2012-06-03 DIAGNOSIS — K219 Gastro-esophageal reflux disease without esophagitis: Secondary | ICD-10-CM | POA: Insufficient documentation

## 2012-06-03 DIAGNOSIS — F32A Depression, unspecified: Secondary | ICD-10-CM | POA: Insufficient documentation

## 2012-06-03 DIAGNOSIS — R51 Headache: Secondary | ICD-10-CM

## 2012-06-03 DIAGNOSIS — E785 Hyperlipidemia, unspecified: Secondary | ICD-10-CM

## 2012-06-03 DIAGNOSIS — R4781 Slurred speech: Secondary | ICD-10-CM

## 2012-06-03 DIAGNOSIS — F329 Major depressive disorder, single episode, unspecified: Secondary | ICD-10-CM

## 2012-06-03 DIAGNOSIS — F419 Anxiety disorder, unspecified: Secondary | ICD-10-CM | POA: Insufficient documentation

## 2012-06-03 MED ORDER — NORTRIPTYLINE HCL 10 MG PO CAPS: 10.0000 mg | ORAL_CAPSULE | Freq: Every day | ORAL | Status: DC

## 2012-06-03 MED ORDER — TRAMADOL HCL 50 MG PO TABS: 50.0000 mg | ORAL_TABLET | Freq: Three times a day (TID) | ORAL | Status: DC | PRN

## 2012-06-03 NOTE — Progress Notes (Signed)
History: [name] is a 42 years old right-handed [name], accompanied by her fianc, and son at today's clinical visit,  she is referred by her primary care physician Dr. Caryn Section for evaluation of one month history of headaches   She has past medical history of diabetes, hypertension, hyperlipidemia, reported 1 month history of headaches, denied previous history of headaches, She complains of 10 out of 10 bilateral occipital area severe pounding headache, with associated left-sided weakness and numbness, was actually admitted to hospital in April 23rd 2014, at [facility], had extensive evaluation, MRI of the brain showed small vessel disease, MRA of the brain was normal, CT of the head was normal, echocardiogram was normal, MRA of the brain was normal,  Laboratory showed elevated creatinine 1.57, otherwise normal CMP, and elevated WBC 12, mild anemia at 10.8,  LDL 62, triglyceride 301  She continued to complain left arm numbness, weakness, left leg weakness, left knee buckle, difficulty with ambulating, 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   Constitutional:   fatigue Cardiovascular:  N/A Ear/Nose/Throat:  N/A Skin: N/A Eyes: Blurred vision Respiratory: N/A Gastroitestinal: N/A    Hematology/Lymphatic:  N/A Endocrine:  N/A Musculoskeletal:N/A Allergy/Immunology: N/A Neurological: Headache, numbness, weakness, insomnia dizziness Psychiatric:   Depression decreased energy,  PHYSICAL EXAMINATOINS:  Generalized: In no acute distress  Neck: Supple, no carotid bruits   Cardiac: Regular rate rhythm  Pulmonary: Clear to auscultation bilaterally  Musculoskeletal: No deformity  Neurological examination  Mentation: Alert oriented to time, place, history taking, and causual  conversation  Cranial nerve II-XII: Pupils were equal round reactive to light extraocular movements were full, visual field were full on confrontational test. facial sensation and strength were normal. hearing was intact to finger rubbing bilaterally. Uvula tongue midline.  head turning and shoulder shrug and were normal and symmetric.Tongue protrusion into cheek strength was normal.  Motor: give away weakness of left arm and leg.  Sensory: decreased fine touch, pinprick at left arm, leg,  Decreased left side vibratory sensation   Coordination: Normal finger to nose, heel-to-shin bilaterally there was no truncal ataxia  Gait: Rising up from seated position without assistance, normal stance, without trunk ataxia, moderate stride, good arm swing, smooth turning, able to perform tiptoe, and heel walking without difficulty.   Romberg signs: Negative  Deep tendon reflexes: Brachioradialis 2/2, biceps 2/2, triceps 2/2, patellar 2/2, Achilles 2/2, plantar responses were flexor bilaterally.  Assessment and plan: 42 years old right-handed female, presenting with one month history of headaches,with left-sided weakness, numbness, MRI of the brain showed small vessel disease  1 most consistent with complicated migraine. 2 keep aspirin, tramadol as needed nortriptyline every night, 3 bring MRI films to review on next visit in 3 months.Marland Kitchen

## 2012-06-04 ENCOUNTER — Encounter (HOSPITAL_COMMUNITY): Payer: Self-pay | Admitting: Family Medicine

## 2012-06-04 ENCOUNTER — Emergency Department (HOSPITAL_COMMUNITY)
Admission: EM | Admit: 2012-06-04 | Discharge: 2012-06-05 | Disposition: A | Discharge status: Home / Self Care | Payer: BC Managed Care – PPO | Attending: Emergency Medicine | Admitting: Emergency Medicine

## 2012-06-04 DIAGNOSIS — G43909 Migraine, unspecified, not intractable, without status migrainosus: Secondary | ICD-10-CM | POA: Insufficient documentation

## 2012-06-04 DIAGNOSIS — Z79899 Other long term (current) drug therapy: Secondary | ICD-10-CM | POA: Insufficient documentation

## 2012-06-04 DIAGNOSIS — R51 Headache: Secondary | ICD-10-CM | POA: Insufficient documentation

## 2012-06-04 DIAGNOSIS — Z794 Long term (current) use of insulin: Secondary | ICD-10-CM | POA: Insufficient documentation

## 2012-06-04 DIAGNOSIS — I252 Old myocardial infarction: Secondary | ICD-10-CM | POA: Insufficient documentation

## 2012-06-04 DIAGNOSIS — I10 Essential (primary) hypertension: Secondary | ICD-10-CM | POA: Insufficient documentation

## 2012-06-04 DIAGNOSIS — E669 Obesity, unspecified: Secondary | ICD-10-CM | POA: Insufficient documentation

## 2012-06-04 DIAGNOSIS — Z860100 Personal history of colon polyps, unspecified: Secondary | ICD-10-CM | POA: Insufficient documentation

## 2012-06-04 DIAGNOSIS — Z8719 Personal history of other diseases of the digestive system: Secondary | ICD-10-CM | POA: Insufficient documentation

## 2012-06-04 DIAGNOSIS — Z7982 Long term (current) use of aspirin: Secondary | ICD-10-CM | POA: Insufficient documentation

## 2012-06-04 DIAGNOSIS — Z8673 Personal history of transient ischemic attack (TIA), and cerebral infarction without residual deficits: Secondary | ICD-10-CM | POA: Insufficient documentation

## 2012-06-04 DIAGNOSIS — Z8679 Personal history of other diseases of the circulatory system: Secondary | ICD-10-CM | POA: Insufficient documentation

## 2012-06-04 DIAGNOSIS — F3289 Other specified depressive episodes: Secondary | ICD-10-CM | POA: Insufficient documentation

## 2012-06-04 DIAGNOSIS — E785 Hyperlipidemia, unspecified: Secondary | ICD-10-CM | POA: Insufficient documentation

## 2012-06-04 DIAGNOSIS — F411 Generalized anxiety disorder: Secondary | ICD-10-CM | POA: Insufficient documentation

## 2012-06-04 DIAGNOSIS — E119 Type 2 diabetes mellitus without complications: Secondary | ICD-10-CM | POA: Insufficient documentation

## 2012-06-04 DIAGNOSIS — F329 Major depressive disorder, single episode, unspecified: Secondary | ICD-10-CM | POA: Insufficient documentation

## 2012-06-04 DIAGNOSIS — Z8669 Personal history of other diseases of the nervous system and sense organs: Secondary | ICD-10-CM | POA: Insufficient documentation

## 2012-06-04 MED ORDER — DEXAMETHASONE SODIUM PHOSPHATE 4 MG/ML IJ SOLN: 4.0000 mg | Freq: Once | INTRAMUSCULAR | Status: DC

## 2012-06-04 MED ORDER — PROCHLORPERAZINE EDISYLATE 5 MG/ML IJ SOLN
10.0000 mg | Freq: Four times a day (QID) | INTRAMUSCULAR | Status: DC | PRN
  Administered 2012-06-04: 10 mg via INTRAVENOUS
  Filled 2012-06-04: qty 2

## 2012-06-04 MED ORDER — PROMETHAZINE HCL 25 MG/ML IJ SOLN
25.0000 mg | INTRAMUSCULAR | Status: DC | PRN
  Administered 2012-06-04: 25 mg via INTRAVENOUS
  Filled 2012-06-04: qty 1

## 2012-06-04 MED ORDER — FENTANYL CITRATE 0.05 MG/ML IJ SOLN
50.0000 ug | Freq: Once | INTRAMUSCULAR | Status: AC
  Administered 2012-06-04: 50 ug via INTRAVENOUS
  Filled 2012-06-04: qty 2

## 2012-06-04 MED ORDER — PROCHLORPERAZINE 25 MG RE SUPP: 25.0000 mg | Freq: Once | RECTAL | Status: DC

## 2012-06-04 MED ORDER — OXYCODONE-ACETAMINOPHEN 7.5-325 MG PO TABS: 1.0000 | ORAL_TABLET | ORAL | Status: DC | PRN

## 2012-06-04 MED ORDER — FENTANYL CITRATE 0.05 MG/ML IJ SOLN
100.0000 ug | Freq: Once | INTRAMUSCULAR | Status: AC
  Administered 2012-06-04: 100 ug via INTRAVENOUS
  Filled 2012-06-04: qty 2

## 2012-06-04 NOTE — ED Notes (Signed)
Pt resting on bed asleep.  Lightly shook pt's shoulder in an attempt to wake pt without arousing.  Pt breathing deep and regular

## 2012-06-04 NOTE — ED Provider Notes (Signed)
History     CSN: [id number]  Arrival date & time 06/04/12  1635   First MD Initiated Contact with Patient 06/04/12 1641      Chief Complaint  Patient presents with  . Headache     HPI Patient has headache for 1-2 months with history of depression hypertension and migraines.  Patient saw neurologist yesterday he diagnosed her with complex migraines and start her on a try cyclic along with tramadol.  Patient continues to have headache and left-sided weakness.   Patient admitted to hospital with MRI of the brain and MRA done on April 23 results which showed no acute findings.  No new symptoms today other than continuing headache. Past Medical History  Diagnosis Date  . CVA (cerebral vascular accident)   . Hypertension   . Hyperlipidemia   . Obesity   . Depression   . Anxiety   . Diabetes mellitus     type 2  . Colon polyp   . GERD (gastroesophageal reflux disease)   . Myocardial infarction     WITH STROKE  . Worsening headaches   . Numbness   . Slurred speech   . Depression   . Weakness     Past Surgical History  Procedure Laterality Date  . Appendectomy    . Right oophorectomy    . Tubal ligation      R tube removed still w/ Left  . Dilation and curettage of uterus    . Gastric bypass  09/2009  . S/p gastric bypass      2011 [facility]    Family History  Problem Relation Age of Onset  . Hypertension Mother   . Kidney disease Father   . Heart disease Father   . Hypertension Father   . Diabetes Father   . Stroke Father   . Colon cancer Father     colon cancer    History  Substance Use Topics  . Smoking status: Never Smoker   . Smokeless tobacco: Never Used  . Alcohol Use: No    OB History   Grav Para Term Preterm Abortions TAB SAB Ect Mult Living                  Review of Systems All other systems reviewed and are negative Allergies  Review of patient's allergies indicates no known allergies.  Home Medications   Current Outpatient Rx  Name   Route  Sig  Dispense  Refill  . amLODipine (NORVASC) 10 MG tablet      1 tab by mouth daily--Office visit due now   90 tablet   0   . aspirin 325 MG tablet   Oral   Take 325 mg by mouth daily.         . benazepril (LOTENSIN) 40 MG tablet   Oral   Take 1 tablet (40 mg total) by mouth daily.   90 tablet   3   . buPROPion (WELLBUTRIN XL) 300 MG 24 hr tablet   Oral   Take 300 mg by mouth daily.         . citalopram (CELEXA) 40 MG tablet   Oral   Take 1 tablet (40 mg total) by mouth daily.   90 tablet   3   . cloNIDine (CATAPRES) 0.1 MG tablet   Oral   Take 1 tablet (0.1 mg total) by mouth 2 (two) times daily.   180 tablet   3   . fenofibrate 160 MG tablet  Oral   Take 1 tablet (160 mg total) by mouth daily.   90 tablet   3   . gabapentin (NEURONTIN) 300 MG capsule   Oral   Take 1 capsule (300 mg total) by mouth 3 (three) times daily.   270 capsule   3   . glucose blood (ONE TOUCH ULTRA TEST) test strip   Other   1 each by Other route. Use as instructed  Up to 6 times daily          . Insulin Aspart (NOVOLOG [place])   Subcutaneous   Inject into the skin as directed.         Marland Kitchen EXPIRED: metFORMIN (GLUCOPHAGE) 1000 MG tablet   Oral   Take 1 tablet (1,000 mg total) by mouth 2 (two) times daily with a meal.   180 tablet   3   . nortriptyline (PAMELOR) 10 MG capsule   Oral   Take 1 capsule (10 mg total) by mouth at bedtime.   30 capsule   6     Patient needs office visit for further refills   . promethazine (PHENERGAN) 25 MG tablet   Oral   Take 25 mg by mouth every 6 (six) hours as needed.         . traMADol (ULTRAM) 50 MG tablet   Oral   Take 1 tablet (50 mg total) by mouth every 8 (eight) hours as needed for pain.   60 tablet   6     BP 91/48  Pulse 55  Temp(Src) 98.3 F (36.8 C) (Oral)  Resp 18  SpO2 97%  Physical Exam  Nursing note and vitals reviewed. Constitutional: She is oriented to person, place, and time. She appears  well-developed and well-nourished. No distress.  HENT:  Head: Normocephalic and atraumatic.  Eyes: Pupils are equal, round, and reactive to light.  Neck: Normal range of motion.  Cardiovascular: Normal rate and intact distal pulses.   Pulmonary/Chest: No respiratory distress.  Abdominal: Normal appearance. She exhibits no distension.  Musculoskeletal: Normal range of motion.  Neurological: She is alert and oriented to person, place, and time. No cranial nerve deficit. Coordination normal. GCS eye subscore is 4. GCS verbal subscore is 5. GCS motor subscore is 6.  Skin: Skin is warm and dry. No rash noted.  Psychiatric: She has a normal mood and affect. Her behavior is normal.    ED Course  Procedures (including critical care time) Medications  fentaNYL (SUBLIMAZE) injection 100 mcg (100 mcg Intravenous Given 06/04/12 1714)  fentaNYL (SUBLIMAZE) injection 50 mcg (50 mcg Intravenous Given 06/04/12 1810)  fentaNYL (SUBLIMAZE) injection 50 mcg (50 mcg Intravenous Given 06/04/12 1902)  oxyCODONE-acetaminophen (PERCOCET/ROXICET) 5-325 MG per tablet 1 tablet (1 tablet Oral Given 06/05/12 0041)    Labs Reviewed - No data to display No results found.   1. Chronic headache   2. Migraine       MDM   After treatment in the ED the patient feels much improved and wants to go home.      Dot Lanes, MD 06/09/12 [phone]

## 2012-06-04 NOTE — ED Notes (Signed)
Per pt she has had a HA x 1 month with left sided weakness. Was recently started on a new medication by her doctor for the HA. sts not getting any better.

## 2012-06-05 ENCOUNTER — Emergency Department (HOSPITAL_COMMUNITY): Payer: BC Managed Care – PPO

## 2012-06-05 MED ORDER — OXYCODONE-ACETAMINOPHEN 5-325 MG PO TABS
1.0000 | ORAL_TABLET | Freq: Once | ORAL | Status: AC
  Administered 2012-06-05: 1 via ORAL
  Filled 2012-06-05: qty 1

## 2012-06-05 NOTE — ED Notes (Signed)
Patient is resting comfortably. 

## 2012-06-06 ENCOUNTER — Telehealth: Payer: Self-pay | Admitting: Neurology

## 2012-06-06 NOTE — Telephone Encounter (Signed)
Patient is a patient of Dr.Yan for headaches. Patient wants to know will Dr. Krista Blue fill out FMLA paper?  I told patient I would ask the nurse. Told patient $20.00 dollar fee for form and 14 days turnover time.

## 2012-06-07 ENCOUNTER — Telehealth: Payer: Self-pay | Admitting: Neurology

## 2012-06-07 ENCOUNTER — Ambulatory Visit (INDEPENDENT_AMBULATORY_CARE_PROVIDER_SITE_OTHER): Payer: BC Managed Care – PPO | Admitting: Neurology

## 2012-06-07 DIAGNOSIS — G43901 Migraine, unspecified, not intractable, with status migrainosus: Secondary | ICD-10-CM

## 2012-06-07 MED ORDER — SODIUM CHLORIDE 0.9 % IV SOLN
500.0000 mg | INTRAVENOUS | Status: DC
  Administered 2012-06-07: 500 mg via INTRAVENOUS
  Administered 2012-06-07: 250 mg via INTRAVENOUS

## 2012-06-07 MED ORDER — VALPROATE SODIUM 500 MG/5ML IV SOLN
500.0000 mg | INTRAVENOUS | Status: DC
  Administered 2012-06-07: 500 mg via INTRAVENOUS

## 2012-06-07 MED ORDER — PROCHLORPERAZINE EDISYLATE 5 MG/ML IJ SOLN
10.0000 mg | Freq: Once | INTRAMUSCULAR | Status: AC
  Administered 2012-06-07: 10 mg via INTRAVENOUS

## 2012-06-07 MED ORDER — KETOROLAC TROMETHAMINE 60 MG/2ML IM SOLN
30.0000 mg | Freq: Once | INTRAMUSCULAR | Status: AC
  Administered 2012-06-07: 30 mg via INTRAMUSCULAR

## 2012-06-07 NOTE — Telephone Encounter (Signed)
I spoke to patient who stated that she has had a severe migraine since her visit on 06-03-12.  She went to the ER on Saturday and the ER doctor told her their is an interaction with her celexa and the meds Dr. Krista Blue put her on, so she stopped taking the celexa.  She wants to know what else she can do for the migraine.  Please advise her number is [phone].

## 2012-06-07 NOTE — Telephone Encounter (Signed)
I have called her, she complains of severe headaches for 3 weeks, it is at right side, pounding, severe, light noise sensitive, no nause, she has taken tramadol without helping,   I have advise her to come in for iv infusion. Depacone 500mg  iv, solumedrol 250mg ,

## 2012-06-07 NOTE — Telephone Encounter (Signed)
IV infusion, compazine 10mg  iv, toradol 30mg  im. For iv infusion

## 2012-06-07 NOTE — Telephone Encounter (Signed)
tws

## 2012-06-07 NOTE — Telephone Encounter (Signed)
Spoke to patient and told her we would fill out the [facility] paperwork, she just needs to pay $20.00 fee and we have a two week turn around time.  I also see another message about her severe migraine.  I left a message that I would forward that to the doctor.

## 2012-06-07 NOTE — Patient Instructions (Signed)
Patient told to call if no change in a day.

## 2012-06-07 NOTE — Progress Notes (Signed)
Patient here for Depacon 500mg , Solumedrol 250 mg, Toradol 30mg , and Compazine 10mg .   Patient to treatment room with husband.  Patient complains of headache pain level 10.  IV first attempted in left FA unsuccessful, second attempt in right FA unsuccessful, third attempt in right AC, good blood return, 24g angiocath.  Normal saline started and compazine 10mg  given IV push, then flushed with 8cc NS.  Depacon started and completed, no change in pain level.  New bag of 100cc NS started with Solumedrol 250mg , upon completion pain level about 7.  IV discontinued and Toradol 30 mg given IM in left gluteal.  Patient to check out and told to rest and call if no change tomorrow.

## 2012-06-07 NOTE — Telephone Encounter (Signed)
Patient is coming in this afternoon for an appointment.

## 2012-06-08 DIAGNOSIS — Z0289 Encounter for other administrative examinations: Secondary | ICD-10-CM

## 2012-06-10 ENCOUNTER — Telehealth: Payer: Self-pay | Admitting: Neurology

## 2012-06-10 NOTE — Telephone Encounter (Signed)
Patient was asked to call to advise [place] if IV infusion was not effective yesterday. Patient states that it was not. She reports that she is in bed with a pillow over her head and her pain is 9/10. She states that she does not like taking medication but will take her medication as ordered and will call back on Monday if she is still in pain. She was encouraged to take her medications as ordered and to stay well hydrated. Patient also stated that she submitted disability forms (they were submitted on Jun 07, 2012) and wants to go out of work on disability. She said she was asked if she had ever gone for headache rehabilitation or seen a headache specialist and she said she never has.

## 2012-06-13 NOTE — Telephone Encounter (Signed)
Reviewed agree above.

## 2012-06-20 ENCOUNTER — Telehealth: Payer: Self-pay | Admitting: Neurology

## 2012-06-20 NOTE — Telephone Encounter (Signed)
I spoke to patient and told her we do not do STD forms for headaches, but we did complete the FMLA form.  She will pick up forms tomorrow.  I have left them at the front.

## 2012-06-24 ENCOUNTER — Telehealth: Payer: Self-pay | Admitting: Neurology

## 2012-06-24 NOTE — Telephone Encounter (Signed)
The patient left message that she is having a bad headache and that none of the pain medicine is working.  I tried to call her back to get more info but had to leave a message saying I would forward her message to doctor to see what else can be done. Her number is [phone].

## 2012-06-24 NOTE — Telephone Encounter (Signed)
Left message 2nd time.

## 2012-06-24 NOTE — Telephone Encounter (Signed)
I have called and left a message

## 2012-06-28 ENCOUNTER — Telehealth: Payer: Self-pay | Admitting: Neurology

## 2012-06-28 MED ORDER — TOPIRAMATE 50 MG PO TABS: 100.0000 mg | ORAL_TABLET | Freq: Two times a day (BID) | ORAL | Status: DC

## 2012-06-28 NOTE — Telephone Encounter (Signed)
Patient called that she gets daily migraines x 2 months. If she wakes without a migraine, she has one by early afternoon. Severe (10/10) with throbbing and gets dizzy when trying to work around house. Unable to go to work. Taking 100 mg toradol plus 1000 mg excedrine migraine at the same time without any positive effect. Patient has come to office for IV methylPREDNISolone sodium succinate 500. Patient states that was ineffective and only made her sleepy. Please advise.

## 2012-06-28 NOTE — Telephone Encounter (Signed)
I have called her,  Left message at her cell and home phone.  Give her a follow up appt in next available for me or Hoyle Sauer

## 2012-07-11 NOTE — Telephone Encounter (Signed)
Error

## 2012-08-02 ENCOUNTER — Ambulatory Visit: Payer: Self-pay | Admitting: Hematology and Oncology

## 2012-08-02 LAB — CBC CANCER CENTER
Basophil %: 1.4 %
Eosinophil #: 0.2 x10 3/mm (ref 0.0–0.7)
HGB: 11.4 g/dL — ABNORMAL LOW (ref 12.0–16.0)
Lymphocyte %: 20.1 %
MCH: 24.5 pg — ABNORMAL LOW (ref 26.0–34.0)
MCHC: 32.3 g/dL (ref 32.0–36.0)
MCV: 76 fL — ABNORMAL LOW (ref 80–100)
Monocyte #: 0.4 x10 3/mm (ref 0.2–0.9)
Monocyte %: 4.8 %
Neutrophil #: 6.5 x10 3/mm (ref 1.4–6.5)
Neutrophil %: 72 %
RDW: 13.4 % (ref 11.5–14.5)

## 2012-08-02 LAB — IRON AND TIBC
Iron Saturation: 6 %
Iron: 24 ug/dL — ABNORMAL LOW (ref 50–170)
Unbound Iron-Bind.Cap.: 379 ug/dL

## 2012-08-02 LAB — TSH: Thyroid Stimulating Horm: 1.42 u[IU]/mL

## 2012-08-24 ENCOUNTER — Ambulatory Visit: Payer: BC Managed Care – PPO | Admitting: Neurology

## 2012-08-24 LAB — CBC CANCER CENTER
Basophil #: 0.1 x10 3/mm (ref 0.0–0.1)
Basophil %: 0.9 %
HGB: 11.4 g/dL — ABNORMAL LOW (ref 12.0–16.0)
Lymphocyte #: 2 x10 3/mm (ref 1.0–3.6)
Lymphocyte %: 17.8 %
MCH: 24.1 pg — ABNORMAL LOW (ref 26.0–34.0)
Monocyte %: 5.8 %
Neutrophil #: 8.4 x10 3/mm — ABNORMAL HIGH (ref 1.4–6.5)
Neutrophil %: 74 %
Platelet: 262 x10 3/mm (ref 150–440)
RBC: 4.75 10*6/uL (ref 3.80–5.20)
RDW: 13.5 % (ref 11.5–14.5)
WBC: 11.4 x10 3/mm — ABNORMAL HIGH (ref 3.6–11.0)

## 2012-08-24 LAB — FERRITIN: Ferritin (ARMC): 8 ng/mL (ref 8–388)

## 2012-08-24 LAB — IRON AND TIBC
Iron Bind.Cap.(Total): 361 ug/dL (ref 250–450)
Iron Saturation: 7 %
Unbound Iron-Bind.Cap.: 335 ug/dL

## 2012-08-24 LAB — RETICULOCYTES: Reticulocyte: 1.97 %

## 2012-08-26 ENCOUNTER — Ambulatory Visit: Payer: Self-pay | Admitting: Neurology

## 2012-09-02 ENCOUNTER — Ambulatory Visit: Payer: Self-pay | Admitting: Hematology and Oncology

## 2012-09-22 LAB — FERRITIN: Ferritin (ARMC): 262 ng/mL (ref 8–388)

## 2012-09-22 LAB — RETICULOCYTES
Absolute Retic Count: 0.1011 10*6/uL
Reticulocyte: 1.96 %

## 2012-09-22 LAB — CBC CANCER CENTER
Basophil #: 0.1 x10 3/mm (ref 0.0–0.1)
Eosinophil %: 1.6 %
HCT: 40 % (ref 35.0–47.0)
HGB: 13.2 g/dL (ref 12.0–16.0)
Lymphocyte %: 18.4 %
MCH: 25.6 pg — ABNORMAL LOW (ref 26.0–34.0)
MCHC: 33 g/dL (ref 32.0–36.0)
Monocyte %: 5.1 %
Neutrophil #: 7.6 x10 3/mm — ABNORMAL HIGH (ref 1.4–6.5)
Platelet: 220 x10 3/mm (ref 150–440)
RBC: 5.17 10*6/uL (ref 3.80–5.20)

## 2012-09-22 LAB — IRON AND TIBC
Iron Bind.Cap.(Total): 258 ug/dL (ref 250–450)
Iron: 63 ug/dL (ref 50–170)
Unbound Iron-Bind.Cap.: 195 ug/dL

## 2012-10-03 ENCOUNTER — Ambulatory Visit: Payer: Self-pay | Admitting: Hematology and Oncology

## 2012-10-29 ENCOUNTER — Emergency Department: Payer: Self-pay | Admitting: Internal Medicine

## 2012-10-29 LAB — CBC
HCT: 39.2 % (ref 35.0–47.0)
MCHC: 33.3 g/dL (ref 32.0–36.0)
RBC: 5.01 10*6/uL (ref 3.80–5.20)
RDW: 19.1 % — ABNORMAL HIGH (ref 11.5–14.5)
WBC: 11.5 10*3/uL — ABNORMAL HIGH (ref 3.6–11.0)

## 2012-10-29 LAB — COMPREHENSIVE METABOLIC PANEL
Albumin: 3.8 g/dL (ref 3.4–5.0)
Alkaline Phosphatase: 100 U/L (ref 50–136)
Bilirubin,Total: 0.3 mg/dL (ref 0.2–1.0)
Creatinine: 1.18 mg/dL (ref 0.60–1.30)
EGFR (African American): >60
Osmolality: 276 (ref 275–301)
SGPT (ALT): 52 U/L (ref 12–78)
Sodium: 137 mmol/L (ref 136–145)

## 2012-11-01 ENCOUNTER — Observation Stay: Payer: Self-pay | Admitting: Internal Medicine

## 2012-11-01 LAB — CBC
HGB: 13.4 g/dL (ref 12.0–16.0)
MCH: 26.3 pg (ref 26.0–34.0)
Platelet: 322 10*3/uL (ref 150–440)
RDW: 18.7 % — ABNORMAL HIGH (ref 11.5–14.5)
WBC: 12.9 10*3/uL — ABNORMAL HIGH (ref 3.6–11.0)

## 2012-11-01 LAB — BASIC METABOLIC PANEL
Anion Gap: 8 (ref 7–16)
BUN: 8 mg/dL (ref 7–18)
Chloride: 103 mmol/L (ref 98–107)
Creatinine: 1.38 mg/dL — ABNORMAL HIGH (ref 0.60–1.30)
EGFR (Non-African Amer.): 47 — ABNORMAL LOW
Glucose: 420 mg/dL — ABNORMAL HIGH (ref 65–99)
Osmolality: 288 (ref 275–301)
Potassium: 3.8 mmol/L (ref 3.5–5.1)

## 2012-11-01 LAB — CK TOTAL AND CKMB (NOT AT ARMC): CK-MB: 1.2 ng/mL (ref 0.5–3.6)

## 2012-11-01 LAB — TROPONIN I: Troponin-I: <0.02 ng/mL

## 2012-11-02 DIAGNOSIS — R079 Chest pain, unspecified: Secondary | ICD-10-CM

## 2012-11-02 LAB — CBC WITH DIFFERENTIAL/PLATELET
Basophil #: 0.1 10*3/uL (ref 0.0–0.1)
Basophil %: 0.9 %
Eosinophil #: 0.2 10*3/uL (ref 0.0–0.7)
Eosinophil %: 1.9 %
HCT: 34.7 % — ABNORMAL LOW (ref 35.0–47.0)
Lymphocyte #: 1.8 10*3/uL (ref 1.0–3.6)
Lymphocyte %: 20 %
Neutrophil %: 71.2 %
Platelet: 255 10*3/uL (ref 150–440)
WBC: 9 10*3/uL (ref 3.6–11.0)

## 2012-11-02 LAB — BASIC METABOLIC PANEL
Calcium, Total: 8.7 mg/dL (ref 8.5–10.1)
Chloride: 107 mmol/L (ref 98–107)
Creatinine: 1.14 mg/dL (ref 0.60–1.30)
EGFR (Non-African Amer.): 59 — ABNORMAL LOW
Potassium: 3.5 mmol/L (ref 3.5–5.1)
Sodium: 139 mmol/L (ref 136–145)

## 2012-11-02 LAB — TROPONIN I: Troponin-I: <0.02 ng/mL

## 2012-11-02 LAB — LIPID PANEL
Cholesterol: 147 mg/dL (ref 0–200)
Ldl Cholesterol, Calc: 55 mg/dL (ref 0–100)
VLDL Cholesterol, Calc: 73 mg/dL — ABNORMAL HIGH (ref 5–40)

## 2012-11-02 LAB — MAGNESIUM: Magnesium: 1.2 mg/dL — ABNORMAL LOW

## 2012-11-02 LAB — HEMOGLOBIN A1C: Hemoglobin A1C: 8.2 % — ABNORMAL HIGH (ref 4.2–6.3)

## 2012-11-02 LAB — TSH: Thyroid Stimulating Horm: 0.88 u[IU]/mL

## 2012-11-08 ENCOUNTER — Telehealth: Payer: Self-pay

## 2012-11-08 NOTE — Telephone Encounter (Signed)
Patient contacted regarding discharge from [facility] on 11/02/12.  Patient understands to follow up with provider Dr. [name] on 11/10/12 at 9:30 at [facility]. Patient understands discharge instructions? yes Patient understands medications and regiment? yes Patient understands to bring all medications to this visit? yes

## 2012-11-10 ENCOUNTER — Ambulatory Visit (INDEPENDENT_AMBULATORY_CARE_PROVIDER_SITE_OTHER): Payer: BC Managed Care – PPO | Admitting: Cardiovascular Disease

## 2012-11-10 ENCOUNTER — Encounter: Payer: Self-pay | Admitting: Cardiovascular Disease

## 2012-11-10 VITALS — BP 154/90 | HR 81 | Ht 62.5 in | Wt 192.5 lb

## 2012-11-10 DIAGNOSIS — R5381 Other malaise: Secondary | ICD-10-CM

## 2012-11-10 DIAGNOSIS — E785 Hyperlipidemia, unspecified: Secondary | ICD-10-CM

## 2012-11-10 DIAGNOSIS — R079 Chest pain, unspecified: Secondary | ICD-10-CM

## 2012-11-10 DIAGNOSIS — E119 Type 2 diabetes mellitus without complications: Secondary | ICD-10-CM

## 2012-11-10 DIAGNOSIS — R5383 Other fatigue: Secondary | ICD-10-CM

## 2012-11-10 DIAGNOSIS — R000 Tachycardia, unspecified: Secondary | ICD-10-CM

## 2012-11-10 DIAGNOSIS — I10 Essential (primary) hypertension: Secondary | ICD-10-CM

## 2012-11-10 MED ORDER — MAGNESIUM CITRATE 100 MG PO TABS: 100.0000 | ORAL_TABLET | Freq: Every day | ORAL | Status: DC

## 2012-11-10 MED ORDER — POTASSIUM CHLORIDE ER 10 MEQ PO TBCR: 10.0000 meq | EXTENDED_RELEASE_TABLET | Freq: Every day | ORAL | Status: DC | PRN

## 2012-11-10 NOTE — Assessment & Plan Note (Signed)
We have encouraged continued exercise, careful diet management in an effort to lose weight. 

## 2012-11-10 NOTE — Patient Instructions (Signed)
You are doing well. Please add clonidine 0.2 mg in the Am as well as the PM Start potassium one a day Start magnesium one a day  Please call us if you have new issues that need to be addressed before your next appt.  Your physician wants you to follow-up in: 2 month.

## 2012-11-10 NOTE — Assessment & Plan Note (Signed)
Long history of chest pain, noncardiac. Prior cardiac catheterizations showing no significant stenoses. Recent negative stress test. No further workup needed at this time

## 2012-11-10 NOTE — Assessment & Plan Note (Signed)
Suggested she stay on her simvastatin. Cholesterol is at goal on the current lipid regimen.

## 2012-11-10 NOTE — Progress Notes (Signed)
Patient ID: [name], female    DOB: 1970-05-26, 42 y.o.   MRN: [id number]  HPI Comments: 42 year old woman with a history of chest pain dating back to 2005 at that time with cardiac catheterization on August 09, 2003 found to have mild plaque in the LAD, but there was no obstructive disease noted.   MUGA, which showed an ejection fraction of 51%.   Transesophageal echocardiogram in the setting of a CVA in May 2010  showed normal LV function, s/p gastric bypass surgery, Who presented to the hospital December 18, 2010 with chest pain.   She had a stress test in the hospital and showed no ischemia. Cardiac enzymes were negative. No EKG changes noted.  Hemoglobin A1c was elevated estimated at 7.9 Total cholesterol 136, LDL 71, HDL 20, normal renal function  She reports that she continues to have occasional episodes of chest pain. Typically these seem to occur at rest, sometimes with exertion.   Recent admission to the hospital 11/01/2012 with discharge 11/02/2012 with chest pain. She ruled out with negative cardiac enzymes. Blood pressures were in the [id number] systolic range. Discharge blood pressure medications included amlodipine 10 mg daily, benazepril 40 mg daily, clonidine 0.2 mg., Coreg 3.125 mg twice a day  She is taking clonidine in the evening. Recent lab work showed total cholesterol 147, LDL 55, HDL 19 Echocardiogram April 2014 showed ejection fraction 50-55%, otherwise normal study Stress test 11/02/2012 showing no ischemia,  While in the hospital her potassium was 3.5, magnesium 1.2, hemoglobin A1c 8.2   she reports having significant loose bowel movements ( possibly from metformin)  EKG shows normal sinus rhythm with rate 81 beats per minute with poor R-wave progression to the anterior precordial leads    Outpatient Encounter Prescriptions as of 11/10/2012  Medication Sig Dispense Refill  . amLODipine (NORVASC) 10 MG tablet Take 10 mg by mouth daily.      Marland Kitchen aspirin 325 MG  tablet Take 325 mg by mouth daily.      . benazepril (LOTENSIN) 40 MG tablet Take 1 tablet (40 mg total) by mouth daily.  90 tablet  3  . buPROPion (WELLBUTRIN XL) 300 MG 24 hr tablet Take 300 mg by mouth daily.      . citalopram (CELEXA) 40 MG tablet Take 40 mg by mouth daily.      . cloNIDine (CATAPRES) 0.1 MG tablet Take 0.2 mg by mouth at bedtime.      . fenofibrate 160 MG tablet Take 1 tablet (160 mg total) by mouth daily.  90 tablet  3  . gabapentin (NEURONTIN) 300 MG capsule Take 1 capsule (300 mg total) by mouth 3 (three) times daily.  270 capsule  3  . glucose blood (ONE TOUCH ULTRA TEST) test strip 1 each by Other route. Use as instructed  Up to 6 times daily       . insulin aspart (NOVOLOG) 100 UNIT/ML injection Inject 4-15 Units into the skin 3 (three) times daily as needed for high blood sugar. Takes anywhere from 4-15 units as directed per sliding scale      . metFORMIN (GLUCOPHAGE) 1000 MG tablet Take 1,000 mg by mouth 2 (two) times daily with a meal.      . Multiple Vitamin (MULTIVITAMIN) tablet Take 1 tablet by mouth daily.       . nortriptyline (PAMELOR) 10 MG capsule Take 1 capsule (10 mg total) by mouth at bedtime.  30 capsule  6  . omeprazole (PRILOSEC) 40  MG capsule Take 1 capsule (40 mg total) by mouth 2 (two) times daily.  60 capsule  2  . promethazine (PHENERGAN) 25 MG tablet Take 25 mg by mouth every 6 (six) hours as needed. For nausea      . simvastatin (ZOCOR) 10 MG tablet Take 10 mg by mouth at bedtime.      . temazepam (RESTORIL) 15 MG capsule Take 1 capsule (15 mg total) by mouth at bedtime as needed.  90 capsule  1  . topiramate (TOPAMAX) 50 MG tablet Take 2 tablets (100 mg total) by mouth 2 (two) times daily. One po bid xone week.  120 tablet  6  . traMADol (ULTRAM) 50 MG tablet Take 1 tablet (50 mg total) by mouth every 8 (eight) hours as needed for pain.  60 tablet  6  . Magnesium Citrate 100 MG TABS Take 100 tablets by mouth daily.  30 tablet  6  . potassium  chloride (K-DUR) 10 MEQ tablet Take 1 tablet (10 mEq total) by mouth daily as needed.  30 tablet  6  . [DISCONTINUED] oxyCODONE-acetaminophen (PERCOCET) 7.5-325 MG per tablet Take 1 tablet by mouth every 4 (four) hours as needed for pain.  15 tablet  0   Review of Systems  Constitutional: Negative.   HENT: Negative.   Eyes: Negative.   Respiratory: Negative.   Gastrointestinal: Negative.   Endocrine: Negative.   Musculoskeletal: Negative.   Skin: Negative.   Allergic/Immunologic: Negative.   Neurological: Negative.   Hematological: Negative.   Psychiatric/Behavioral: Positive for dysphoric mood.  All other systems reviewed and are negative.    BP 154/90  Pulse 81  Ht 5' 2.5" (1.588 m)  Wt 192 lb 8 oz (87.317 kg)  BMI 34.63 kg/m2  Physical Exam  Nursing note and vitals reviewed. Constitutional: She is oriented to person, place, and time. She appears well-developed and well-nourished.  HENT:  Head: Normocephalic.  Nose: Nose normal.  Mouth/Throat: Oropharynx is clear and moist.  Eyes: Conjunctivae are normal. Pupils are equal, round, and reactive to light.  Neck: Normal range of motion. Neck supple. No JVD present.  Cardiovascular: Normal rate, regular rhythm, S1 normal, S2 normal, normal heart sounds and intact distal pulses.  Exam reveals no gallop and no friction rub.   No murmur heard. Pulmonary/Chest: Effort normal and breath sounds normal. No respiratory distress. She has no wheezes. She has no rales. She exhibits no tenderness.  Abdominal: Soft. Bowel sounds are normal. She exhibits no distension. There is no tenderness.  Musculoskeletal: Normal range of motion. She exhibits no edema and no tenderness.  Lymphadenopathy:    She has no cervical adenopathy.  Neurological: She is alert and oriented to person, place, and time. Coordination normal.  Skin: Skin is warm and dry. No rash noted. No erythema.  Psychiatric: She has a normal mood and affect. Her behavior is  normal. Judgment and thought content normal.    Assessment and Plan

## 2012-11-10 NOTE — Assessment & Plan Note (Signed)
Blood pressure is elevated. We have suggested she take her clonidine twice a day, not daily. Asked her to call us with her blood pressures over the next week or so.

## 2012-11-11 ENCOUNTER — Ambulatory Visit: Payer: Self-pay | Admitting: Family Medicine

## 2012-11-29 ENCOUNTER — Encounter: Payer: Self-pay | Admitting: *Deleted

## 2012-11-29 IMAGING — CR DG CHEST 2V
1 series · 2 of 2 positions shown · non-contrast
Comparison: none

REASON FOR EXAM: cp
COMMENTS:

PROCEDURE:     DXR - DXR CHEST PA (OR AP) AND LATERAL  - December 17, 2010 [DATE]
RESULT:     The lung fields are clear. The heart, mediastinal and osseous
structures show no significant abnormalities.

[Series 1: w chest pa · 0.14mm/px · 2 of 2 slices shown]
[im 1/2]
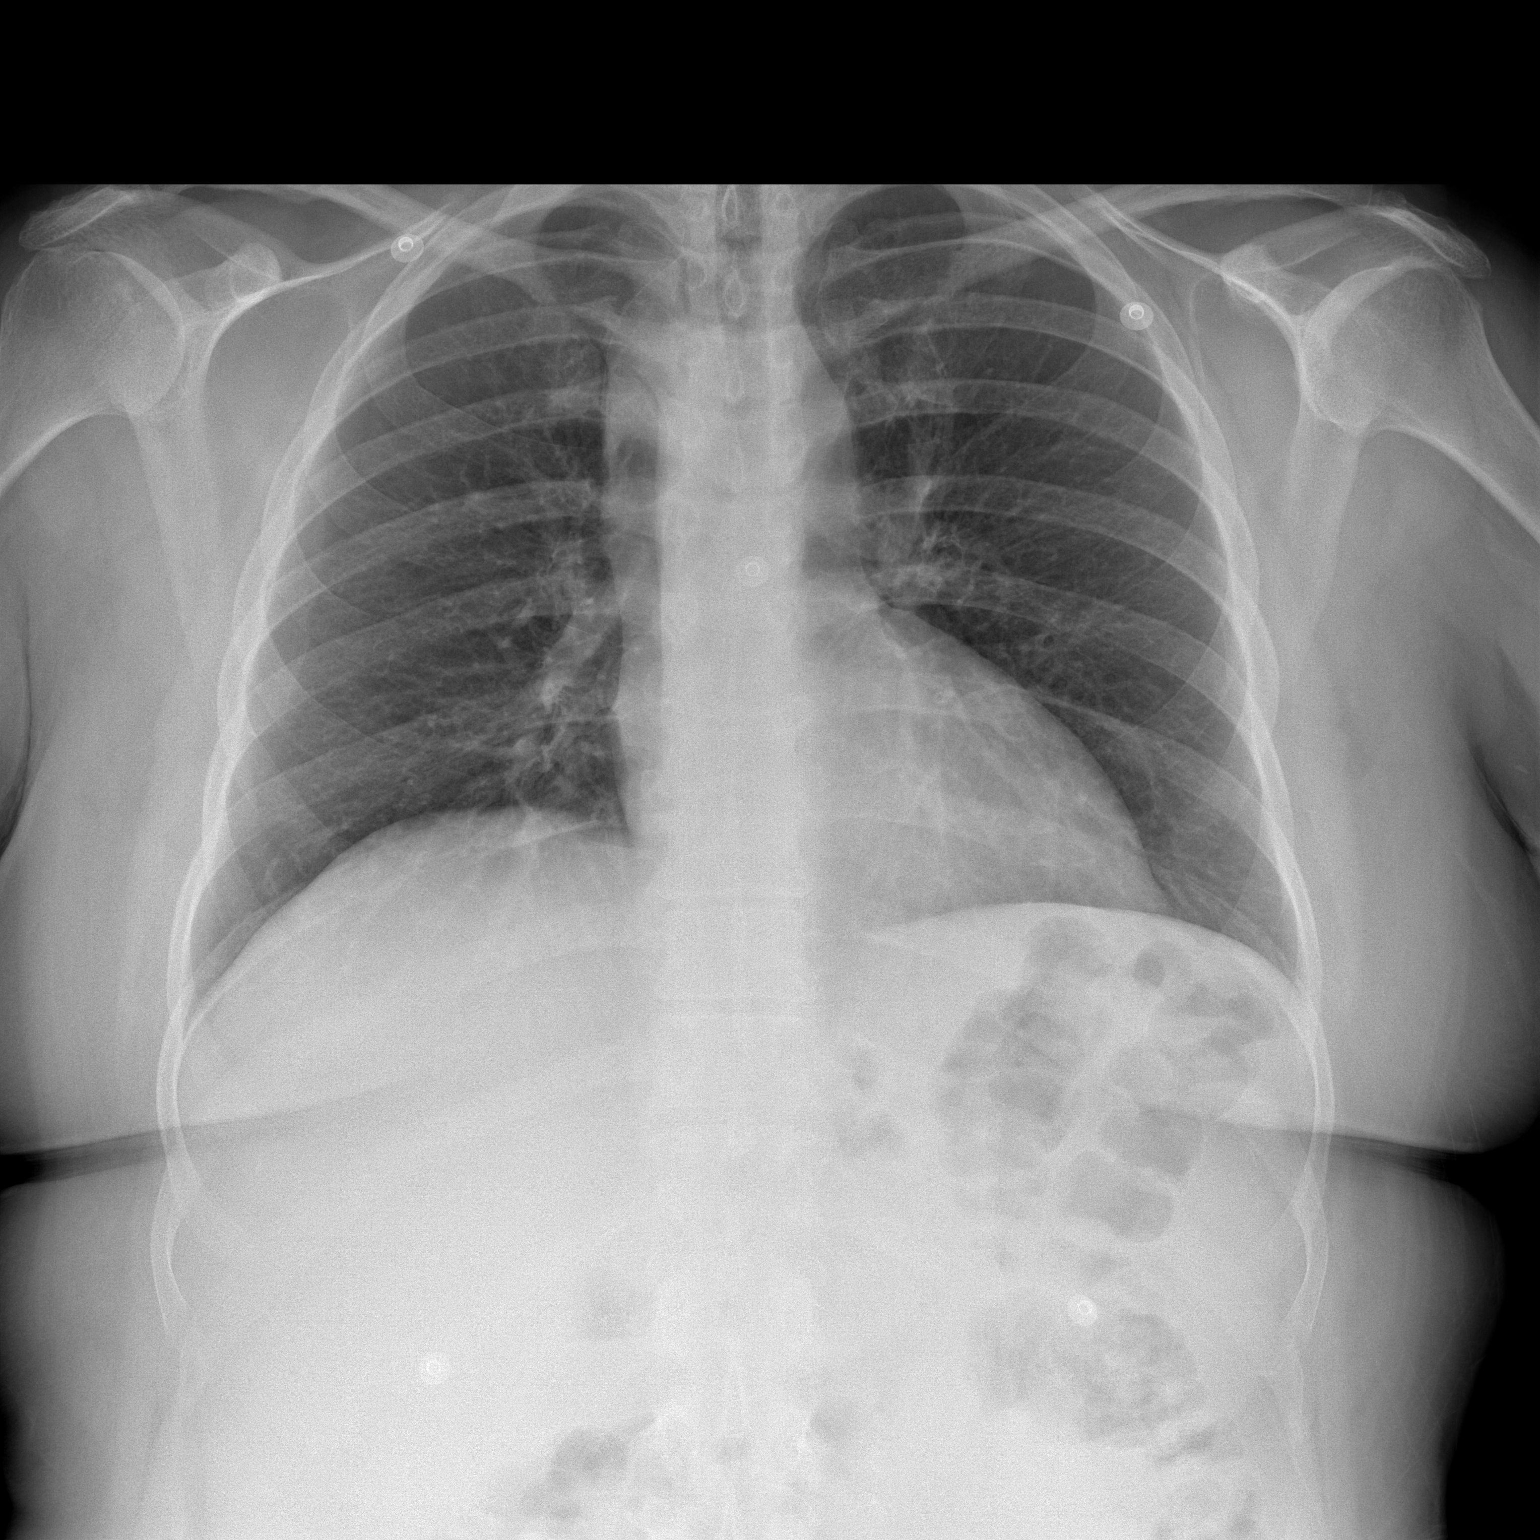
[im 2/2]
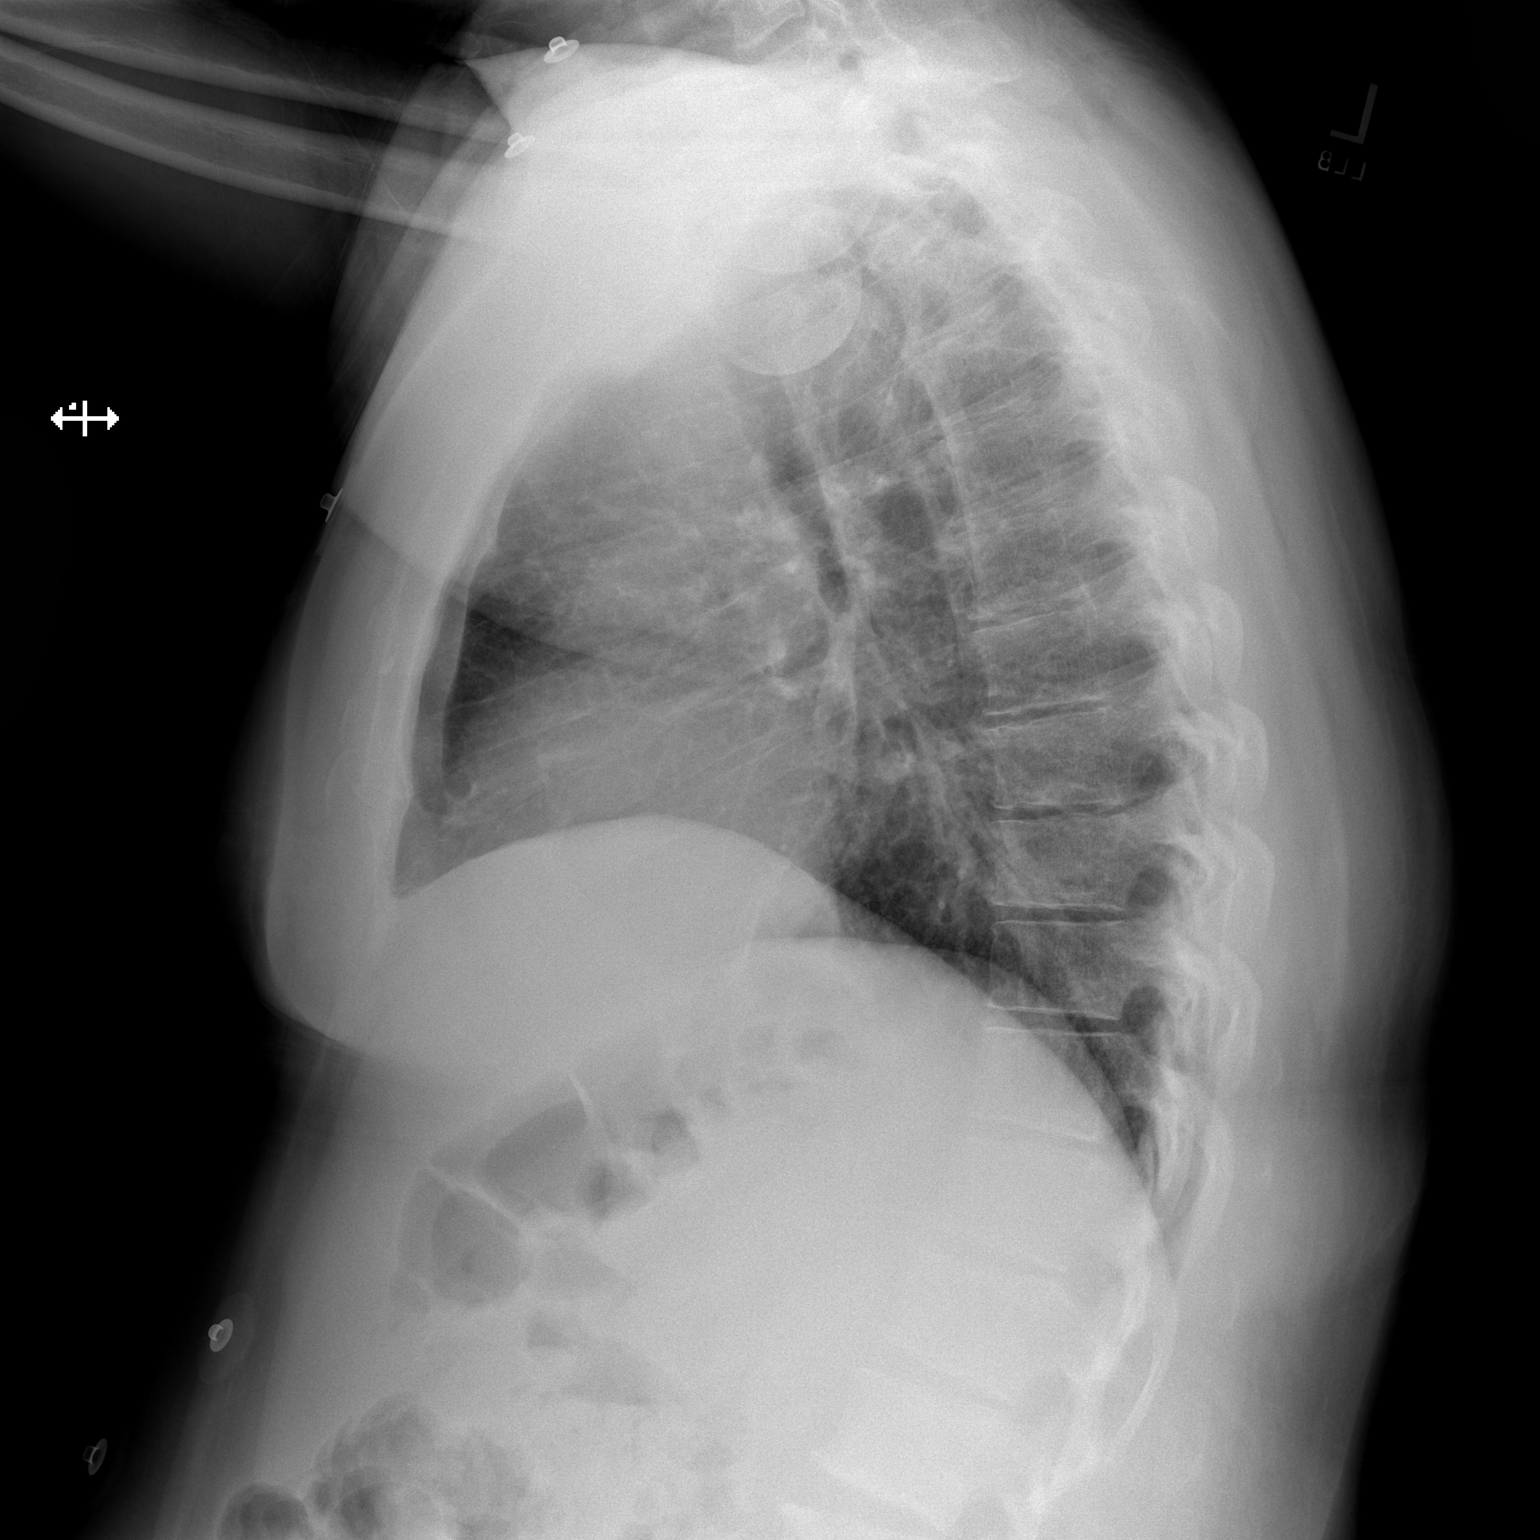

[2 of 2 positions shown; findings below may reference images not displayed]

IMPRESSION: 1.     No acute changes are identified.

## 2012-11-30 ENCOUNTER — Encounter: Payer: Self-pay | Admitting: *Deleted

## 2012-12-08 ENCOUNTER — Other Ambulatory Visit: Payer: Self-pay

## 2012-12-22 ENCOUNTER — Ambulatory Visit: Payer: Self-pay | Admitting: Gastroenterology

## 2013-01-12 ENCOUNTER — Encounter: Payer: Self-pay | Admitting: Cardiovascular Disease

## 2013-01-12 ENCOUNTER — Ambulatory Visit (INDEPENDENT_AMBULATORY_CARE_PROVIDER_SITE_OTHER): Payer: BC Managed Care – PPO | Admitting: Cardiovascular Disease

## 2013-01-12 VITALS — BP 142/98 | HR 81 | Ht 62.5 in | Wt 193.0 lb

## 2013-01-12 DIAGNOSIS — I10 Essential (primary) hypertension: Secondary | ICD-10-CM

## 2013-01-12 DIAGNOSIS — R002 Palpitations: Secondary | ICD-10-CM

## 2013-01-12 DIAGNOSIS — E669 Obesity, unspecified: Secondary | ICD-10-CM

## 2013-01-12 DIAGNOSIS — R079 Chest pain, unspecified: Secondary | ICD-10-CM

## 2013-01-12 LAB — EKG 12-LEAD

## 2013-01-12 MED ORDER — AMLODIPINE-VALSARTAN-HCTZ 10-320-25 MG PO TABS: 1.0000 | ORAL_TABLET | Freq: Every day | ORAL | Status: DC

## 2013-01-12 NOTE — Assessment & Plan Note (Signed)
She would like to consolidate her meds. We will start exforge HCTZ 10/320/25 mg daily, continue clonidine 0.1 mg BID

## 2013-01-12 NOTE — Patient Instructions (Signed)
You are doing well. Change to exforge HCT 10/320/25 mg once a day  (other option is called tribenzor) Once you get the pill and start the medication, Stop the amlodipine, stop the HCTZ, stop the benazepril  Try to take clonidine twice a day  Please call us if you have new issues that need to be addressed before your next appt.  Your physician wants you to follow-up in: 6 months.  You will receive a reminder letter in the mail two months in advance. If you don't receive a letter, please call our office to schedule the follow-up appointment.

## 2013-01-12 NOTE — Assessment & Plan Note (Signed)
No known CAD. No chest pain on todays visit

## 2013-01-12 NOTE — Assessment & Plan Note (Signed)
We have encouraged continued exercise, careful diet management in an effort to lose weight. 

## 2013-01-12 NOTE — Progress Notes (Signed)
Patient ID: [name], female    DOB: 12-08-1970, 42 y.o.   MRN: [id number]  HPI Comments: 42 year old woman with a long hx of smoking, history of chest pain dating back to 2005 at that time with cardiac catheterization on August 09, 2003 found to have mild plaque in the LAD, but there was no obstructive disease noted.   MUGA, which showed an ejection fraction of 51%.   Transesophageal echocardiogram in the setting of a CVA in May 2010  showed normal LV function, s/p gastric bypass surgery, Who presented to the hospital December 18, 2010 with chest pain.    stress test in the hospital and showed no ischemia. Cardiac enzymes were negative. No EKG changes noted.  Hemoglobin A1c was elevated estimated at 7.9 Total cholesterol 136, LDL 71, HDL 20, normal renal function  admission to the hospital 11/01/2012 with discharge 11/02/2012 with chest pain. She ruled out with negative cardiac enzymes. Blood pressures were in the 123XX123 systolic range. Discharge blood pressure medications included amlodipine 10 mg daily, benazepril 40 mg daily, clonidine 0.2 mg., Coreg 3.125 mg twice a day  She is taking clonidine in the evening only, 0.2 mg Blood pressure has been running high and she would like to consolidate her meds.  Recent lab work showed total cholesterol 147, LDL 55, HDL 19 Echocardiogram April 2014 showed ejection fraction 50-55%, otherwise normal study Stress test 11/02/2012 showing no ischemia,  While in the hospital her potassium was 3.5, magnesium 1.2, hemoglobin A1c 8.2   she reports having significant loose bowel movements ( possibly from metformin)  EKG shows normal sinus rhythm with rate 81 beats per minute with no significant ST or T wave changes   Outpatient Encounter Prescriptions as of 01/12/2013  Medication Sig  . amLODipine (NORVASC) 10 MG tablet Take 10 mg by mouth daily.  Marland Kitchen aspirin 325 MG tablet Take 325 mg by mouth daily.  . benazepril (LOTENSIN) 40 MG tablet Take 1 tablet  (40 mg total) by mouth daily.  Marland Kitchen buPROPion (WELLBUTRIN XL) 300 MG 24 hr tablet Take 300 mg by mouth daily.  . Canagliflozin (INVOKANA) 300 MG TABS Take by mouth daily.  . carvedilol (COREG) 3.125 MG tablet Take 3.125 mg by mouth 2 (two) times daily with a meal.  . citalopram (CELEXA) 40 MG tablet Take 40 mg by mouth daily.  . cloNIDine (CATAPRES) 0.1 MG tablet Take 0.2 mg by mouth at bedtime.  . fenofibrate (TRICOR) 145 MG tablet Take 145 mg by mouth daily.  Marland Kitchen gabapentin (NEURONTIN) 300 MG capsule Take 1 capsule (300 mg total) by mouth 3 (three) times daily.  Marland Kitchen glucose blood (ONE TOUCH ULTRA TEST) test strip 1 each by Other route. Use as instructed  Up to 6 times daily   . hydrochlorothiazide (HYDRODIURIL) 25 MG tablet Take 25 mg by mouth daily.  . insulin aspart (NOVOLOG) 100 UNIT/ML injection Inject 4-15 Units into the skin 3 (three) times daily as needed for high blood sugar. Takes anywhere from 4-15 units as directed per sliding scale  . lansoprazole (PREVACID) 15 MG capsule Take 15 mg by mouth 2 (two) times daily before a meal.  . metFORMIN (GLUCOPHAGE) 1000 MG tablet Take 1,000 mg by mouth 2 (two) times daily with a meal.  . Multiple Vitamin (MULTIVITAMIN) tablet Take 1 tablet by mouth daily.   . nortriptyline (PAMELOR) 10 MG capsule Take 1 capsule (10 mg total) by mouth at bedtime.  . promethazine (PHENERGAN) 25 MG tablet Take 25 mg  by mouth every 6 (six) hours as needed. For nausea  . simvastatin (ZOCOR) 10 MG tablet Take 10 mg by mouth at bedtime.  . temazepam (RESTORIL) 15 MG capsule Take 1 capsule (15 mg total) by mouth at bedtime as needed.  . [DISCONTINUED] fenofibrate 160 MG tablet Take 1 tablet (160 mg total) by mouth daily.  . [DISCONTINUED] Magnesium Citrate 100 MG TABS Take 100 tablets by mouth daily.  . [DISCONTINUED] omeprazole (PRILOSEC) 40 MG capsule Take 1 capsule (40 mg total) by mouth 2 (two) times daily.  . [DISCONTINUED] potassium chloride (K-DUR) 10 MEQ tablet Take 1  tablet (10 mEq total) by mouth daily as needed.  . [DISCONTINUED] topiramate (TOPAMAX) 50 MG tablet Take 2 tablets (100 mg total) by mouth 2 (two) times daily. One po bid xone week.  . [DISCONTINUED] traMADol (ULTRAM) 50 MG tablet Take 1 tablet (50 mg total) by mouth every 8 (eight) hours as needed for pain.     Review of Systems  Constitutional: Negative.   HENT: Negative.   Eyes: Negative.   Respiratory: Positive for shortness of breath.   Cardiovascular: Negative.   Gastrointestinal: Negative.   Endocrine: Negative.   Musculoskeletal: Negative.   Skin: Negative.   Allergic/Immunologic: Negative.   Neurological: Negative.   Hematological: Negative.   Psychiatric/Behavioral: Positive for dysphoric mood.  All other systems reviewed and are negative.    BP 142/98  Pulse 81  Ht 5' 2.5" (1.588 m)  Wt 193 lb (87.544 kg)  BMI 34.72 kg/m2  Physical Exam  Nursing note and vitals reviewed. Constitutional: She is oriented to person, place, and time. She appears well-developed and well-nourished.  HENT:  Head: Normocephalic.  Nose: Nose normal.  Mouth/Throat: Oropharynx is clear and moist.  Eyes: Conjunctivae are normal. Pupils are equal, round, and reactive to light.  Neck: Normal range of motion. Neck supple. No JVD present.  Cardiovascular: Normal rate, regular rhythm, S1 normal, S2 normal, normal heart sounds and intact distal pulses.  Exam reveals no gallop and no friction rub.   No murmur heard. Pulmonary/Chest: Effort normal and breath sounds normal. No respiratory distress. She has no wheezes. She has no rales. She exhibits no tenderness.  Abdominal: Soft. Bowel sounds are normal. She exhibits no distension. There is no tenderness.  Musculoskeletal: Normal range of motion. She exhibits no edema and no tenderness.  Lymphadenopathy:    She has no cervical adenopathy.  Neurological: She is alert and oriented to person, place, and time. Coordination normal.  Skin: Skin is warm  and dry. No rash noted. No erythema.  Psychiatric: She has a normal mood and affect. Her behavior is normal. Judgment and thought content normal.    Assessment and Plan

## 2013-01-28 ENCOUNTER — Inpatient Hospital Stay: Payer: Self-pay | Admitting: Internal Medicine

## 2013-01-28 LAB — URINALYSIS, COMPLETE
Bilirubin,UR: NEGATIVE
Blood: NEGATIVE
Ketone: NEGATIVE
Nitrite: POSITIVE
Ph: 5 (ref 4.5–8.0)
Specific Gravity: 1.014 (ref 1.003–1.030)
Squamous Epithelial: 7
WBC UR: 28 /HPF (ref 0–5)

## 2013-01-28 LAB — COMPREHENSIVE METABOLIC PANEL
Anion Gap: 7 (ref 7–16)
BUN: 15 mg/dL (ref 7–18)
Calcium, Total: 9 mg/dL (ref 8.5–10.1)
Co2: 26 mmol/L (ref 21–32)
Creatinine: 1.45 mg/dL — ABNORMAL HIGH (ref 0.60–1.30)
Glucose: 228 mg/dL — ABNORMAL HIGH (ref 65–99)
Potassium: 4.3 mmol/L (ref 3.5–5.1)
SGPT (ALT): 53 U/L (ref 12–78)
Sodium: 136 mmol/L (ref 136–145)

## 2013-01-28 LAB — CBC
HGB: 13 g/dL (ref 12.0–16.0)
MCH: 26.8 pg (ref 26.0–34.0)
MCHC: 33.3 g/dL (ref 32.0–36.0)
MCV: 81 fL (ref 80–100)
Platelet: 212 10*3/uL (ref 150–440)
RBC: 4.86 10*6/uL (ref 3.80–5.20)
WBC: 7.6 10*3/uL (ref 3.6–11.0)

## 2013-01-28 LAB — TROPONIN I: Troponin-I: <0.02 ng/mL

## 2013-01-29 ENCOUNTER — Ambulatory Visit: Payer: Self-pay | Admitting: Neurology

## 2013-01-29 DIAGNOSIS — I6350 Cerebral infarction due to unspecified occlusion or stenosis of unspecified cerebral artery: Secondary | ICD-10-CM

## 2013-01-29 LAB — HEMOGLOBIN A1C: Hemoglobin A1C: 8.5 % — ABNORMAL HIGH (ref 4.2–6.3)

## 2013-01-29 LAB — TROPONIN I: Troponin-I: <0.02 ng/mL

## 2013-01-29 LAB — LIPID PANEL

## 2013-01-30 LAB — COMPREHENSIVE METABOLIC PANEL
Alkaline Phosphatase: 121 U/L — ABNORMAL HIGH
BUN: 17 mg/dL (ref 7–18)
Calcium, Total: 9.4 mg/dL (ref 8.5–10.1)
Chloride: 104 mmol/L (ref 98–107)
Creatinine: 1.29 mg/dL (ref 0.60–1.30)
EGFR (African American): 58 — ABNORMAL LOW
EGFR (Non-African Amer.): 50 — ABNORMAL LOW
Osmolality: 278 (ref 275–301)
SGPT (ALT): 55 U/L (ref 12–78)
Sodium: 136 mmol/L (ref 136–145)
Total Protein: 7.1 g/dL (ref 6.4–8.2)

## 2013-01-30 LAB — CK: CK, Total: 41 U/L (ref 21–215)

## 2013-02-01 ENCOUNTER — Telehealth: Payer: Self-pay

## 2013-02-01 NOTE — Telephone Encounter (Signed)
Spoke w/ pt.  She is unclear as to whether or not she was put on the "right" meds while in the hospital.   She states that no one from our office saw while she was admitted and states that "I think they lied to me when they told me one of your doctors read my echo." Informed pt that Dr. Acie Fredrickson read her echo and that he is part of [facility].  She reports "I feel better knowing he's from your office." She states that her meds were changed while inpt and she would like to ensure that she was given the correct meds. Informed pt that she should receive a TCM call from doctors that she was scheduled to see after her d/c and they would address questions about her meds and d/c instructions. She states that her PCP's office called her today and sched an appt.    She reports that she will address med and d/c questions w/ PCP and will call us with further questions or concerns.

## 2013-02-01 NOTE — Telephone Encounter (Signed)
Pt was discharged yesterday from [facility] and has some questions regarding her medications

## 2013-02-03 LAB — CBC AND DIFFERENTIAL
HEMATOCRIT: 40 % (ref 36–46)
Hemoglobin: 12.9 g/dL (ref 12.0–16.0)
PLATELETS: 274 10*3/uL (ref 150–399)
WBC: 9.5 10^3/mL

## 2013-02-03 LAB — BASIC METABOLIC PANEL
BUN: 17 mg/dL (ref 4–21)
Creatinine: 1.6 mg/dL — AB (ref 0.5–1.1)
Glucose: 189 mg/dL
Potassium: 5.3 mmol/L (ref 3.4–5.3)
SODIUM: 137 mmol/L (ref 137–147)

## 2013-02-09 ENCOUNTER — Ambulatory Visit (INDEPENDENT_AMBULATORY_CARE_PROVIDER_SITE_OTHER): Payer: Medicaid Other | Admitting: Cardiovascular Disease

## 2013-02-09 ENCOUNTER — Encounter: Payer: Self-pay | Admitting: Cardiovascular Disease

## 2013-02-09 VITALS — BP 80/62 | HR 60 | Ht 62.0 in | Wt 193.0 lb

## 2013-02-09 DIAGNOSIS — R5381 Other malaise: Secondary | ICD-10-CM

## 2013-02-09 DIAGNOSIS — I6350 Cerebral infarction due to unspecified occlusion or stenosis of unspecified cerebral artery: Secondary | ICD-10-CM

## 2013-02-09 DIAGNOSIS — I10 Essential (primary) hypertension: Secondary | ICD-10-CM

## 2013-02-09 DIAGNOSIS — R531 Weakness: Secondary | ICD-10-CM

## 2013-02-09 DIAGNOSIS — E119 Type 2 diabetes mellitus without complications: Secondary | ICD-10-CM

## 2013-02-09 DIAGNOSIS — R5383 Other fatigue: Secondary | ICD-10-CM

## 2013-02-09 DIAGNOSIS — I951 Orthostatic hypotension: Secondary | ICD-10-CM

## 2013-02-09 DIAGNOSIS — E785 Hyperlipidemia, unspecified: Secondary | ICD-10-CM

## 2013-02-09 MED ORDER — MIDODRINE HCL 10 MG PO TABS: 10.0000 mg | ORAL_TABLET | Freq: Three times a day (TID) | ORAL | Status: DC | PRN

## 2013-02-09 NOTE — Assessment & Plan Note (Signed)
Recommended she stay on her high-dose Lipitor. Goal LDL less than 70

## 2013-02-09 NOTE — Patient Instructions (Signed)
Please add aspirin 81 mg with plavix If no complications, increase the aspirin up to 81 mg x 2 in a few weeks with plavix  For low blood pressure Start midodrine 5 mg (1/2 pil) three times a day Monitor your  Blood pressure If blood pressure continues to run low, You might need the full 10 mg pill three times a day  Please call us if you have new issues that need to be addressed before your next appt.  Your physician wants you to follow-up in: 1 month.

## 2013-02-09 NOTE — Assessment & Plan Note (Signed)
We have encouraged continued exercise, careful diet management in an effort to lose weight. 

## 2013-02-09 NOTE — Progress Notes (Signed)
Patient ID: [name], female    DOB: 05/15/1970, 43 y.o.   MRN: [id number]  HPI Comments: 43 year old woman with a long hx of smoking, numerous prior strokes,  history of chest pain dating back to 2005 at that time with cardiac catheterization on August 09, 2003 found to have mild plaque in the LAD, but there was no obstructive disease noted.   MUGA, which showed an ejection fraction of 51%.   Transesophageal echocardiogram in the setting of a CVA in May 2010  showed normal LV function, s/p gastric bypass surgery, Who presented to the hospital December 18, 2010 with chest pain.    stress test in the hospital and showed no ischemia. Cardiac enzymes were negative. No EKG changes noted.  Hemoglobin A1c was elevated estimated at 7.9 Total cholesterol 136, LDL 71, HDL 20, normal renal function  admission to the hospital 11/01/2012 with discharge 11/02/2012 with chest pain. She ruled out with negative cardiac enzymes. Blood pressures were in the 123XX123 systolic range. Discharge blood pressure medications included amlodipine 10 mg daily, benazepril 40 mg daily, clonidine 0.2 mg., Coreg 3.125 mg twice a day  In followup today, she reports recent stroke and admission to the hospital at the end of December 2014. She had acute onset left sided weakness including arm and leg. She reports that review of MRI showed acute stroke, as well as previous strokes. She was seen by neurology and aspirin changed to Plavix 75 mg daily. Hemoglobin A1c 8.5. Notes indicate short run of SVT while she was in the hospital and on the monitor. She is asymptomatic at the time. She does report occasional fast rhythms but is not particularly symptomatic.  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 buPROPion (WELLBUTRIN XL) 300 MG 24 hr tablet Take 300 mg by mouth daily.  . Canagliflozin (INVOKANA) 300 MG TABS Take by mouth daily.  . carvedilol (COREG) 3.125 MG tablet Take 3.125 mg by mouth 2 (two) times daily with a meal.  . gabapentin (NEURONTIN) 300 MG capsule Take 1 capsule (300 mg total) by mouth 3 (three) times daily.  Marland Kitchen glucose blood (ONE TOUCH ULTRA TEST) test strip 1 each by Other route. Use as instructed  Up to 6 times daily   . insulin aspart (NOVOLOG) 100 UNIT/ML injection Inject 4-15 Units into the skin 3 (three) times daily as needed for high blood sugar. Takes anywhere from 4-15 units as directed per sliding scale  . lansoprazole (PREVACID) 15 MG capsule Take 15 mg by mouth 2 (two) times daily before a meal.  . metFORMIN (GLUCOPHAGE) 1000 MG tablet Take 1,000 mg by mouth 2 (two) times daily with a meal.  . Multiple Vitamin (MULTIVITAMIN) tablet Take 1 tablet by mouth daily.   . nortriptyline (PAMELOR) 10 MG capsule Take 1 capsule (10 mg total) by mouth at bedtime.  . promethazine (PHENERGAN) 25 MG tablet Take 25 mg by mouth every 6 (six) hours as needed. For nausea  . temazepam (RESTORIL) 15 MG capsule Take  1 capsule (15 mg total) by mouth at bedtime as needed.  . fenofibrate (TRICOR) 145 MG tablet Take 145 mg by mouth daily.     Review of Systems  Constitutional: Negative.   HENT: Negative.   Eyes: Negative.   Cardiovascular: Negative.   Gastrointestinal: Negative.   Endocrine: Negative.   Musculoskeletal: Positive for gait problem.  Skin: Negative.   Allergic/Immunologic: Negative.   Neurological: Negative.        Left arm, left leg weakness  Hematological:  Negative.   Psychiatric/Behavioral: Positive for dysphoric mood.  All other systems reviewed and are negative.    BP 80/62  Pulse 60  Ht 5\' 2"  (1.575 m)  Wt 193 lb (87.544 kg)  BMI 35.29 kg/m2  Physical Exam  Nursing note and vitals reviewed. Constitutional: She is oriented to person, place, and time. She appears well-developed and well-nourished.  HENT:  Head: Normocephalic.  Nose: Nose normal.  Mouth/Throat: Oropharynx is clear and moist.  Eyes: Conjunctivae are normal. Pupils are equal, round, and reactive to light.  Neck: Normal range of motion. Neck supple. No JVD present.  Cardiovascular: Normal rate, regular rhythm, S1 normal, S2 normal, normal heart sounds and intact distal pulses.  Exam reveals no gallop and no friction rub.   No murmur heard. Pulmonary/Chest: Effort normal and breath sounds normal. No respiratory distress. She has no wheezes. She has no rales. She exhibits no tenderness.  Abdominal: Soft. Bowel sounds are normal. She exhibits no distension. There is no tenderness.  Musculoskeletal: Normal range of motion. She exhibits no edema and no tenderness.  Lymphadenopathy:    She has no cervical adenopathy.  Neurological: She is alert and oriented to person, place, and time. Coordination normal.  Skin: Skin is warm and dry. No rash noted. No erythema.  Psychiatric: She has a normal mood and affect. Her behavior is normal. Judgment and thought content normal.    Assessment and Plan

## 2013-02-09 NOTE — Assessment & Plan Note (Addendum)
Numerous prior strokes. We have suggested she restart aspirin 81 mg daily with her Plavix. Could titrate up to 81 mg x2 with her Plavix. If she has additional strokes, could consider warfarin or eliquis BID. We did discuss doing a Holter monitor or event monitor. This would help to rule out other arrhythmia. We'll discuss this with her again in followup. She did not seem particularly eager today to wear monitor

## 2013-02-09 NOTE — Assessment & Plan Note (Signed)
Since her stroke, blood pressure has been low sometimes in the 70 systolic, frequently 80 or 90 systolic. She has dizziness which is limiting her physical therapy and rehabilitation. We will start midodrine 5 mg 3 times a day. This can be titrated up to 10 mg 3 times a day for symptom relief. Suggested she closely monitor her blood pressure even when supine on the medication

## 2013-02-15 ENCOUNTER — Telehealth: Payer: Self-pay

## 2013-02-15 NOTE — Telephone Encounter (Signed)
Calling to report BP readings: 02/25/2013 80/50 sitting right arm. Standing, 60/36, right arm. After standing 1-2 minutes, feels lightheaded, loss of balance. This is with taking her BP medication.

## 2013-02-15 NOTE — Telephone Encounter (Signed)
Spoke w/ Izora Gala, home health nurse who reports that she is still in the driveway of [name]'s home and will go back inside to check on her.  States that when checking orthostatic pressures today, pt reported that she takes a whole midodrine pill in the am.  Izora Gala will clarify if pt is taking midodrine TID as instructed at her last ov. Per Dr. Donivan Scull instructions,   "For low blood pressure  Start midodrine 5 mg (1/2 pil) three times a day  Monitor your Blood pressure  If blood pressure continues to run low,  You might need the full 10 mg pill three times a day"  Izora Gala was given these instructions and will review them w/ pt. Advised Izora Gala that I will call and speak w/ pt if needed.  She will call back if pt needs clarification of if she has further questions or concerns.

## 2013-02-16 ENCOUNTER — Encounter: Payer: Self-pay | Admitting: *Deleted

## 2013-02-20 ENCOUNTER — Telehealth: Payer: Self-pay

## 2013-02-20 NOTE — Telephone Encounter (Signed)
Jeani Hawking, RN from [place] reports that pt was previously instructed by Dr. [name] at last ov:  "For low blood pressure  Start midodrine 5 mg (1/2 pil) three times a day  Monitor your Blood pressure  If blood pressure continues to run low,  You might need the full 10 mg pill three times a day"  Jeani Hawking reports that pt has been taking a whole 10mg  pill TID, but BP is still remaining low.  Reports sitting BP 90/60, standing 86/60 at today's visit to pt's home. Reports that she "just talked to her" about staying hydrated. Reports that since pt's stroke, pt's paperwork states a goal of systolic pressure of [id number] range, by pt is nowhere near this.  Please advise.  Thank you!

## 2013-02-21 ENCOUNTER — Telehealth: Payer: Self-pay

## 2013-02-21 NOTE — Telephone Encounter (Signed)
Which blood pressure medications is she currently taking? I see that several were recently discontinued. Would like to adjust her BP meds (if any). Continue midodrine at 10mg  TID for now. Stay hydrated. Add compression stockings.

## 2013-02-21 NOTE — Telephone Encounter (Signed)
Has dizziness improved from previous office visit 2 weeks ago? We don't have to get BP high, but definitely need to improve sx of dizziness.

## 2013-02-21 NOTE — Telephone Encounter (Signed)
error 

## 2013-02-21 NOTE — Telephone Encounter (Signed)
Spoke w/ pt.  She reports that all of her BP meds were d/c'd at her last ov and she is currently only taking midodrine 10mg  TID. Reports that her BP this am 98/62 sitting. Reports that she has TED hose at home, but has not been wearing them. Pt states that she will wear them today and see if this helps.  Reports that has been increasing her fluid intake in an effort to stay hydrated, but BP is not coming up. Please advise.  Thank you!

## 2013-02-22 NOTE — Telephone Encounter (Signed)
Spoke w/ pt.  She reports that she is not feeling as dizzy as she was before her last ov and states that has definitely improved. She reports that she started wearing her TED hose yesterday but has not noticed that these have helped.  Advised pt to continue to wear them and reiterated the need to stay hydrated and make sure she includes some salt so that her NA+ levels do not drop. She reports that she will monitor sx, home health nurse is sched to come out on Monday, but pt reports that she will contact us if she has any concerns before then.

## 2013-02-22 NOTE — Telephone Encounter (Signed)
Left message for pt to call back  °

## 2013-03-01 ENCOUNTER — Telehealth: Payer: Self-pay | Admitting: *Deleted

## 2013-03-01 DIAGNOSIS — I951 Orthostatic hypotension: Secondary | ICD-10-CM

## 2013-03-01 MED ORDER — FLUDROCORTISONE ACETATE 0.1 MG PO TABS: 0.1000 mg | ORAL_TABLET | Freq: Every day | ORAL | Status: DC

## 2013-03-01 NOTE — Telephone Encounter (Signed)
Home health nurse calling in her bp left arm fitting 90/60 and  Left arm stand 80/58 right arm sitting 88/58 r

## 2013-03-01 NOTE — Telephone Encounter (Signed)
Spoke w/ pt.  She reports that her BP is still low and she feels "like all I want to do is sleep".  Pt would like to know if Dr. [name] has any further recommendations for bringing her BP up so that she will feel better.  Please advise.  Thank you!

## 2013-03-01 NOTE — Telephone Encounter (Signed)
Glad she has less sx on midodrine. Could start florinef 0.1 mg daily with close monitoring of BP Call for significant leg swelling

## 2013-03-01 NOTE — Telephone Encounter (Signed)
Spoke w/ pt.  Advised her of Dr. Donivan Scull recommendations.  She is agreeable to starting florinef.  Reviewed medication w/ her and she is concerned that this will adversely affect her BG. Pt reports that she has to check her BG 7 times a day currently. Pt is to monitor sx and call with further questions or concerns.

## 2013-03-16 ENCOUNTER — Ambulatory Visit (INDEPENDENT_AMBULATORY_CARE_PROVIDER_SITE_OTHER): Payer: Medicaid Other | Admitting: Cardiovascular Disease

## 2013-03-16 ENCOUNTER — Encounter: Payer: Self-pay | Admitting: Cardiovascular Disease

## 2013-03-16 VITALS — BP 100/80 | HR 77 | Ht 62.5 in | Wt 193.5 lb

## 2013-03-16 DIAGNOSIS — R42 Dizziness and giddiness: Secondary | ICD-10-CM

## 2013-03-16 DIAGNOSIS — I951 Orthostatic hypotension: Secondary | ICD-10-CM

## 2013-03-16 DIAGNOSIS — E119 Type 2 diabetes mellitus without complications: Secondary | ICD-10-CM

## 2013-03-16 DIAGNOSIS — R002 Palpitations: Secondary | ICD-10-CM

## 2013-03-16 DIAGNOSIS — I6350 Cerebral infarction due to unspecified occlusion or stenosis of unspecified cerebral artery: Secondary | ICD-10-CM

## 2013-03-16 DIAGNOSIS — I10 Essential (primary) hypertension: Secondary | ICD-10-CM

## 2013-03-16 NOTE — Patient Instructions (Addendum)
Your blood pressure is low Hold the clonidine, Hold the HCTZ Cut the benazepril in 1/2, if not a 1/4 pill once a day OK to talk coreg twice a day  Monitor your blood pressure  Please call us if you have new issues that need to be addressed before your next appt.  Your physician wants you to follow-up in: 1 month.

## 2013-03-16 NOTE — Assessment & Plan Note (Signed)
Blood pressure running low today. Previously was very low and she held midodrine and Florinef, put herself back on her blood pressure medications. We have suggested she hold her clonidine, hold the HCTZ, continue Coreg 3.125 mg twice a day, cut her benazepril in half and monitor her blood pressure. If it continues to trend lower, she will need to hold her blood pressure medications, possibly even restart midodrine low-dose 5 mg 3 times a day, and if further decline, may need to add Florinef 3 times per week for blood pressure support

## 2013-03-16 NOTE — Assessment & Plan Note (Signed)
We have encouraged continued exercise, careful diet management in an effort to lose weight. 

## 2013-03-16 NOTE — Assessment & Plan Note (Signed)
Residual left sided weakness

## 2013-03-16 NOTE — Progress Notes (Signed)
Patient ID: [name], female    DOB: August 18, 1970, 43 y.o.   MRN: [id number]  HPI Comments: 43 year old woman with a long hx of smoking, numerous prior strokes,  history of chest pain dating back to 2005 at that time with cardiac catheterization on August 09, 2003 found to have mild plaque in the LAD, but there was no obstructive disease noted.   MUGA, which showed an ejection fraction of 51%.   Transesophageal echocardiogram in the setting of a CVA in May 2010  showed normal LV function, s/p gastric bypass surgery, Who presented to the hospital December 18, 2010 with chest pain.    stress test in the hospital and showed no ischemia. Cardiac enzymes were negative. No EKG changes noted.  Hemoglobin A1c was elevated estimated at 7.9 Total cholesterol 136, LDL 71, HDL 20, normal renal function  admission to the hospital 11/01/2012 with discharge 11/02/2012 with chest pain. She ruled out with negative cardiac enzymes. Blood pressures were in the 123XX123 systolic range. Discharge blood pressure medications included amlodipine 10 mg daily, benazepril 40 mg daily, clonidine 0.2 mg., Coreg 3.125 mg twice a day   recent stroke and admission to the hospital at the end of December 2014. She had acute onset left sided weakness including arm and leg. She reports that review of MRI showed acute stroke, as well as previous strokes. She was seen by neurology and aspirin changed to Plavix 75 mg daily. Hemoglobin A1c 8.5. Notes indicate short run of SVT while she was in the hospital and on the monitor. She is asymptomatic at the time. 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 He received a phone call that blood pressure continues to run low and midodrine dosing was increased up to 10 mg daily. She reports that this initially helped her blood pressure up to systolic 123XX123 or A999333. That it started to decrease down and  she was having continued symptoms of orthostasis. She was started on Florinef 0.1 mg daily. Today she reports she had systolic pressure of up to A999333 with diastolic pressure greater than 100. She stopped both the Florinef and the midodrine. She restarted several of her blood pressure medications including clonidine at nighttime, benazepril 40 mg in the day, HCTZ 25 daily, Coreg 3.125 mg twice a day. She reports that she does not feel well, systolic pressure of 123XX123 today. She has not been monitoring her blood pressure at home as previously recommended.  Echocardiogram 01/29/2013 suggest ejection fraction 40-45% CT scan of the head showing small vessel disease MRI showing acute infarct right parietal white matter, extensive chronic microvascular ischemic changes  total cholesterol 147, LDL 55, HDL 19 Echocardiogram April 2014 showed ejection fraction 50-55%, otherwise normal study Stress test 11/02/2012 showing no ischemia,  EKG shows normal sinus rhythm  with heart rate 77 beats per minute, nonspecific ST abnormality    Outpatient Encounter Prescriptions as of 03/16/2013  Medication Sig  . atorvastatin (LIPITOR) 40 MG tablet Take 40 mg by mouth daily.  . benazepril (LOTENSIN) 40 MG tablet Take 40 mg by mouth daily.  Marland Kitchen buPROPion (WELLBUTRIN XL) 300 MG 24 hr tablet Take 300 mg by mouth daily.  . carvedilol (COREG) 3.125 MG tablet Take 3.125 mg by mouth 2 (two) times daily with a meal.  . clopidogrel (PLAVIX) 75 MG tablet Take 75 mg by mouth daily with breakfast.  .  escitalopram (LEXAPRO) 20 MG tablet Take 20 mg by mouth daily.  . fenofibrate (TRICOR) 145 MG tablet Take 145 mg by mouth daily.  Marland Kitchen gabapentin (NEURONTIN) 300 MG capsule Take 1 capsule (300 mg total) by mouth 3 (three) times daily.  Marland Kitchen glucose blood (ONE TOUCH ULTRA TEST) test strip 1 each by Other route. Use as instructed  Up to 6 times daily   . hydrochlorothiazide (HYDRODIURIL) 25 MG tablet Take 25 mg by mouth daily.   . insulin  aspart (NOVOLOG) 100 UNIT/ML injection Inject 4-15 Units into the skin 3 (three) times daily as needed for high blood sugar. Takes anywhere from 4-15 units as directed per sliding scale  . insulin detemir (LEVEMIR) 100 UNIT/ML injection Inject 25 Units into the skin at bedtime.  . lansoprazole (PREVACID) 15 MG capsule Take 15 mg by mouth 2 (two) times daily before a meal.  . metFORMIN (GLUCOPHAGE) 1000 MG tablet Take 1,000 mg by mouth 2 (two) times daily with a meal.  . Multiple Vitamin (MULTIVITAMIN) tablet Take 1 tablet by mouth daily.   . promethazine (PHENERGAN) 25 MG tablet Take 25 mg by mouth every 6 (six) hours as needed. For nausea  . temazepam (RESTORIL) 15 MG capsule Take 1 capsule (15 mg total) by mouth at bedtime as needed.  . [DISCONTINUED] Canagliflozin (INVOKANA) 300 MG TABS Take by mouth daily.  . [DISCONTINUED] fludrocortisone (FLORINEF) 0.1 MG tablet Take 1 tablet (0.1 mg total) by mouth daily.  . [DISCONTINUED] midodrine (PROAMATINE) 10 MG tablet Take 1 tablet (10 mg total) by mouth 3 (three) times daily as needed.  . [DISCONTINUED] nortriptyline (PAMELOR) 10 MG capsule Take 1 capsule (10 mg total) by mouth at bedtime.      Review of Systems  Constitutional: Negative.        General malaise  HENT: Negative.   Eyes: Negative.   Respiratory: Negative.   Cardiovascular: Negative.   Gastrointestinal: Negative.   Endocrine: Negative.   Musculoskeletal: Positive for gait problem.  Skin: Negative.   Allergic/Immunologic: Negative.   Neurological: Positive for light-headedness.       Left arm, left leg weakness  Hematological: Negative.   Psychiatric/Behavioral: Positive for dysphoric mood.  All other systems reviewed and are negative.    BP 100/80  Pulse 77  Ht 5' 2.5" (1.588 m)  Wt 193 lb 8 oz (87.771 kg)  BMI 34.81 kg/m2  Physical Exam  Nursing note and vitals reviewed. Constitutional: She is oriented to person, place, and time. She appears well-developed and  well-nourished.  HENT:  Head: Normocephalic.  Nose: Nose normal.  Mouth/Throat: Oropharynx is clear and moist.  Eyes: Conjunctivae are normal. Pupils are equal, round, and reactive to light.  Neck: Normal range of motion. Neck supple. No JVD present.  Cardiovascular: Normal rate, regular rhythm, S1 normal, S2 normal, normal heart sounds and intact distal pulses.  Exam reveals no gallop and no friction rub.   No murmur heard. Pulmonary/Chest: Effort normal and breath sounds normal. No respiratory distress. She has no wheezes. She has no rales. She exhibits no tenderness.  Abdominal: Soft. Bowel sounds are normal. She exhibits no distension. There is no tenderness.  Musculoskeletal: Normal range of motion. She exhibits no edema and no tenderness.  Lymphadenopathy:    She has no cervical adenopathy.  Neurological: She is alert and oriented to person, place, and time. Coordination normal.  Skin: Skin is warm and dry. No rash noted. No erythema.  Psychiatric: She has a normal mood and affect.  Her behavior is normal. Judgment and thought content normal.    Assessment and Plan

## 2013-03-16 NOTE — Assessment & Plan Note (Signed)
Seems to have improved but we'll need to closely monitor her blood pressure over the next several weeks

## 2013-03-24 ENCOUNTER — Ambulatory Visit: Payer: Self-pay | Admitting: Family Medicine

## 2013-04-05 LAB — HM DIABETES EYE EXAM

## 2013-04-13 ENCOUNTER — Ambulatory Visit: Payer: Medicaid Other | Admitting: Cardiovascular Disease

## 2013-04-13 ENCOUNTER — Ambulatory Visit (INDEPENDENT_AMBULATORY_CARE_PROVIDER_SITE_OTHER): Payer: Medicaid Other | Admitting: Cardiovascular Disease

## 2013-04-13 ENCOUNTER — Encounter: Payer: Self-pay | Admitting: Cardiovascular Disease

## 2013-04-13 VITALS — BP 150/90 | HR 83 | Ht 62.0 in | Wt 194.0 lb

## 2013-04-13 DIAGNOSIS — E119 Type 2 diabetes mellitus without complications: Secondary | ICD-10-CM

## 2013-04-13 DIAGNOSIS — I6350 Cerebral infarction due to unspecified occlusion or stenosis of unspecified cerebral artery: Secondary | ICD-10-CM

## 2013-04-13 DIAGNOSIS — E669 Obesity, unspecified: Secondary | ICD-10-CM

## 2013-04-13 DIAGNOSIS — E785 Hyperlipidemia, unspecified: Secondary | ICD-10-CM

## 2013-04-13 DIAGNOSIS — I10 Essential (primary) hypertension: Secondary | ICD-10-CM

## 2013-04-13 MED ORDER — AMLODIPINE BESYLATE 10 MG PO TABS: 10.0000 mg | ORAL_TABLET | Freq: Every day | ORAL | Status: DC

## 2013-04-13 MED ORDER — HYDROCHLOROTHIAZIDE 25 MG PO TABS: 25.0000 mg | ORAL_TABLET | Freq: Every day | ORAL | Status: DC

## 2013-04-13 MED ORDER — CARVEDILOL 3.125 MG PO TABS: 3.1250 mg | ORAL_TABLET | Freq: Two times a day (BID) | ORAL | Status: DC

## 2013-04-13 NOTE — Patient Instructions (Signed)
You are doing well. Please start coreg twice a day Start amlodipine one a day (morning) Continue enalapril one a day Monitor blood pressure  Please call us if you have new issues that need to be addressed before your next appt.  Your physician wants you to follow-up in: 3 months.  You will receive a reminder letter in the mail two months in advance. If you don't receive a letter, please call our office to schedule the follow-up appointment.

## 2013-04-13 NOTE — Assessment & Plan Note (Signed)
Prior severe hypotension following recent stroke has now resolved, now restarting blood pressure medications. Residual left-sided weakness

## 2013-04-13 NOTE — Progress Notes (Signed)
Patient ID: [name], female    DOB: [birth year], 43 y.o.   MRN: [id number]  HPI Comments: 43 year old woman with a long hx of smoking, numerous prior strokes,  history of chest pain dating back to 2005 at that time with cardiac catheterization on August 09, 2003 found to have mild plaque in the LAD, but there was no obstructive disease noted.   MUGA, which showed an ejection fraction of 51%.   Transesophageal echocardiogram in the setting of a CVA in May 2010  showed normal LV function, s/p gastric bypass surgery, Who presented to the hospital December 18, 2010 with chest pain.    stress test in the hospital and showed no ischemia. Cardiac enzymes were negative. No EKG changes noted.  Hemoglobin A1c was elevated estimated at 7.9 Total cholesterol 136, LDL 71, HDL 20, normal renal function  admission to the hospital 11/01/2012 with discharge 11/02/2012 with chest pain. She ruled out with negative cardiac enzymes. Blood pressures were in the 123XX123 systolic range. Discharge blood pressure medications included amlodipine 10 mg daily, benazepril 40 mg daily, clonidine 0.2 mg., Coreg 3.125 mg twice a day   recent stroke and admission to the hospital at the end of December 2014. She had acute onset left sided weakness including arm and leg. She reports that review of MRI showed acute stroke, as well as previous strokes. She was seen by neurology and aspirin changed to Plavix 75 mg daily. Hemoglobin A1c 8.5. Notes indicate short run of SVT while she was in the hospital and on the monitor.  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 Now after several months, she has hypertension and is getting back on her previous hypertension medications . Recently started back on ACE inhibitor with enalapril 5 mg daily. On her last clinic visit we have suggested she hold the HCTZ, continue on carvedilol and cut the benazepril in  half. She's currently not taking anything other than enalapril Systolic pressures running in the 150-160 range.  She reports that she has stopped drinking sweet tea and hemoglobin A1c has significantly improved  Echocardiogram 01/29/2013 suggest ejection fraction 40-45% CT scan of the head showing small vessel disease MRI showing acute infarct right parietal white matter, extensive chronic microvascular ischemic changes  total cholesterol 147, LDL 55, HDL 19 Echocardiogram April 2014 showed ejection fraction 50-55%, otherwise normal study Stress test 11/02/2012 showing no ischemia,  EKG shows normal sinus rhythm  with heart rate 83 beats per minute, nonspecific ST abnormality    Outpatient Encounter Prescriptions as of 04/13/2013  Medication Sig  . atorvastatin (LIPITOR) 40 MG tablet Take 40 mg by mouth daily.  Marland Kitchen buPROPion (WELLBUTRIN XL) 300 MG 24 hr tablet Take 300 mg by mouth daily.  . clopidogrel (PLAVIX) 75 MG tablet Take 75 mg by mouth daily with breakfast.  . enalapril (VASOTEC) 5 MG tablet Take 5 mg by mouth daily.  Marland Kitchen escitalopram (LEXAPRO) 20 MG tablet Take 20 mg by mouth daily.  Marland Kitchen gabapentin (NEURONTIN) 300 MG capsule Take 1 capsule (300 mg total) by mouth 3 (three) times daily.  Marland Kitchen glucose blood (ONE TOUCH ULTRA TEST) test strip 1 each by Other route. Use as instructed  Up to 6 times daily   . insulin aspart (NOVOLOG) 100 UNIT/ML injection Inject 4-15 Units into the skin 3 (three) times daily as needed for high blood sugar. Takes anywhere from 4-15 units as directed per sliding scale  .  insulin detemir (LEVEMIR) 100 UNIT/ML injection Inject 25 Units into the skin at bedtime.  . lansoprazole (PREVACID) 15 MG capsule Take 15 mg by mouth 2 (two) times daily before a meal.  . metFORMIN (GLUCOPHAGE) 1000 MG tablet Take 1,000 mg by mouth 2 (two) times daily with a meal.  . Multiple Vitamin (MULTIVITAMIN) tablet Take 1 tablet by mouth daily.   . promethazine (PHENERGAN) 25 MG tablet Take  25 mg by mouth every 6 (six) hours as needed. For nausea  . temazepam (RESTORIL) 15 MG capsule Take 1 capsule (15 mg total) by mouth at bedtime as needed.     Review of Systems  Constitutional: Negative.   HENT: Negative.   Eyes: Negative.   Respiratory: Negative.   Cardiovascular: Negative.   Gastrointestinal: Negative.   Endocrine: Negative.   Musculoskeletal: Positive for gait problem.  Skin: Negative.   Allergic/Immunologic: Negative.   Neurological:       Left arm, left leg weakness  Hematological: Negative.   Psychiatric/Behavioral: Positive for dysphoric mood.  All other systems reviewed and are negative.    BP 150/90  Pulse 83  Ht 5\' 2"  (1.575 m)  Wt 194 lb (87.998 kg)  BMI 35.47 kg/m2  Physical Exam  Nursing note and vitals reviewed. Constitutional: She is oriented to person, place, and time. She appears well-developed and well-nourished.  HENT:  Head: Normocephalic.  Nose: Nose normal.  Mouth/Throat: Oropharynx is clear and moist.  Eyes: Conjunctivae are normal. Pupils are equal, round, and reactive to light.  Neck: Normal range of motion. Neck supple. No JVD present.  Cardiovascular: Normal rate, regular rhythm, S1 normal, S2 normal, normal heart sounds and intact distal pulses.  Exam reveals no gallop and no friction rub.   No murmur heard. Pulmonary/Chest: Effort normal and breath sounds normal. No respiratory distress. She has no wheezes. She has no rales. She exhibits no tenderness.  Abdominal: Soft. Bowel sounds are normal. She exhibits no distension. There is no tenderness.  Musculoskeletal: Normal range of motion. She exhibits no edema and no tenderness.  Lymphadenopathy:    She has no cervical adenopathy.  Neurological: She is alert and oriented to person, place, and time. Coordination normal.  Skin: Skin is warm and dry. No rash noted. No erythema.  Psychiatric: She has a normal mood and affect. Her behavior is normal. Judgment and thought content  normal.    Assessment and Plan

## 2013-04-13 NOTE — Assessment & Plan Note (Signed)
Cholesterol is at goal on the current lipid regimen. No changes to the medications were made.  

## 2013-04-13 NOTE — Assessment & Plan Note (Signed)
We have encouraged continued exercise, careful diet management in an effort to lose weight. 

## 2013-04-13 NOTE — Assessment & Plan Note (Signed)
She reports significantly improved diabetes control with dietary changes.

## 2013-04-13 NOTE — Assessment & Plan Note (Signed)
Will restart amlodipine 10 mg daily, restart coreg 3.125 continue enalapril 5 mg daily. If blood pressure continues to run high, she could either increase enalapril up to 10 mg daily or 5 mg twice a day, alternatively he could restart HCTZ

## 2013-04-17 ENCOUNTER — Telehealth: Payer: Self-pay | Admitting: *Deleted

## 2013-04-17 DIAGNOSIS — R002 Palpitations: Secondary | ICD-10-CM

## 2013-04-17 MED ORDER — CARVEDILOL 6.25 MG PO TABS: 6.2500 mg | ORAL_TABLET | Freq: Two times a day (BID) | ORAL | Status: DC

## 2013-04-17 NOTE — Telephone Encounter (Signed)
Okay to order a 48-hour Holter monitor Coreg can be increased as needed for better heart rate control Could try 6.25 mg twice a day

## 2013-04-17 NOTE — Telephone Encounter (Signed)
Patient called and having blurry vision, feels funny and heart pounding. Please advise

## 2013-04-17 NOTE — Telephone Encounter (Signed)
Spoke w/ pt.  She reports that at her last ov, she had discussed "spells" of her heart beating really fast and feeling like her heart is going to beat out of her chest. She experienced this again this afternoon.  BP @ kidney doctor this am 115/79. No other symptoms.  She reports that Dr. [name] had mentioned at her last visit that she would need a monitor and she is interested in this now.  Advised her that I would find out how long he recommends her to wear this and call her back.  Please advise.  Thank you.

## 2013-04-17 NOTE — Telephone Encounter (Signed)
Advised pt of Dr. Donivan Scull recommendations.   She is agreeable to this and will await a call from [facility] for her holter monitor.

## 2013-04-20 DIAGNOSIS — R002 Palpitations: Secondary | ICD-10-CM

## 2013-04-24 ENCOUNTER — Encounter: Payer: Self-pay | Admitting: Family Medicine

## 2013-04-28 ENCOUNTER — Telehealth: Payer: Self-pay

## 2013-04-28 NOTE — Telephone Encounter (Signed)
Reviewed holter results w/ pt.   She reports that she is still experiencing episodes of tachycardia, though not as frequent.  She is currently taking coreg 6.25mg  BID.  She would like to know if there are other options to reduce these episodes. Please advise.  Thank you.

## 2013-05-01 ENCOUNTER — Ambulatory Visit (INDEPENDENT_AMBULATORY_CARE_PROVIDER_SITE_OTHER): Payer: Medicaid Other

## 2013-05-01 ENCOUNTER — Other Ambulatory Visit: Payer: Self-pay

## 2013-05-01 DIAGNOSIS — R002 Palpitations: Secondary | ICD-10-CM

## 2013-05-01 NOTE — Telephone Encounter (Signed)
Spoke w/ pt.  Advised her of Dr. Donivan Scull recommendation.  She is agreeable to monitoring her HR when she feels good, as well as when she is experiencing episodes.  She will call with her numbers and discuss possible increase in coreg.

## 2013-05-01 NOTE — Telephone Encounter (Signed)
Need to know what pulse rate is running Could increase coreg dose but only if rate not too low

## 2013-05-03 ENCOUNTER — Encounter: Payer: Self-pay | Admitting: Family Medicine

## 2013-05-16 ENCOUNTER — Telehealth: Payer: Self-pay

## 2013-05-16 NOTE — Telephone Encounter (Signed)
Pt called and states she went to PCP and wanted dr [name] to know her BP is running low. 80/60. Please call.

## 2013-05-16 NOTE — Telephone Encounter (Signed)
Left message for pt to call back  °

## 2013-05-16 NOTE — Telephone Encounter (Signed)
Spoke w/ pt.  She reports that she was at her PCP's office today and was told to contact us, as her BP was 80/60. She reports that she was at the beach this weekend and felt funny, but attributed it to the change in the weather.  States that she felt a bit lightheaded, but not dizzy.  Advised pt to monitor her BP and call tomorrow w/ readings.  She states that she would prefer to have more numbers and will call Thursday, unless she is feeling bad again.

## 2013-05-18 ENCOUNTER — Telehealth: Payer: Self-pay

## 2013-05-18 NOTE — Telephone Encounter (Signed)
Pt was calling back with BP readings:  4/14- Left arm 83/65, HR 108; Right arm 87/62, HR 104  4/15 Left arm 100/66, HR 94; Right arm 104/61, HR 64; Left arm 98/64, HR 66; Right arm 106/73, HR 65; Left arm 105/62, HR 75; Right arm 104/63, HR 72  4/16 Left arm 72/54, HR 82; Right arm 86/54, HR 82

## 2013-05-18 NOTE — Telephone Encounter (Signed)
Pt was calling back with BP readings: 4/14- Left arm  83/65, HR 108; Right arm 87/62, HR 104 4/15 Left arm 100/66, HR 94; Right arm 104/61, HR 64; Left arm 98/64, HR 66; Right arm 106/73, HR 65; Left arm 105/62, HR 75; Right arm 104/63, HR 72 4/16 Left arm 72/54, HR 82; Right arm 86/54, HR 82

## 2013-05-18 NOTE — Telephone Encounter (Signed)
Would stop amlodipine If BP continues to run low, would hold enalapril

## 2013-05-19 ENCOUNTER — Emergency Department: Payer: Self-pay | Admitting: Emergency Medicine

## 2013-05-19 LAB — COMPREHENSIVE METABOLIC PANEL
ALT: 61 U/L (ref 12–78)
Albumin: 3.6 g/dL (ref 3.4–5.0)
Alkaline Phosphatase: 97 U/L
Anion Gap: 7 (ref 7–16)
BUN: 25 mg/dL — AB (ref 7–18)
Bilirubin,Total: 0.3 mg/dL (ref 0.2–1.0)
CO2: 25 mmol/L (ref 21–32)
Calcium, Total: 9.1 mg/dL (ref 8.5–10.1)
Chloride: 101 mmol/L (ref 98–107)
Creatinine: 1.56 mg/dL — ABNORMAL HIGH (ref 0.60–1.30)
EGFR (Non-African Amer.): 40 — ABNORMAL LOW
GFR CALC AF AMER: 46 — AB
Glucose: 107 mg/dL — ABNORMAL HIGH (ref 65–99)
OSMOLALITY: 271 (ref 275–301)
Potassium: 4.5 mmol/L (ref 3.5–5.1)
SGOT(AST): 20 U/L (ref 15–37)
Sodium: 133 mmol/L — ABNORMAL LOW (ref 136–145)
Total Protein: 7.1 g/dL (ref 6.4–8.2)

## 2013-05-19 LAB — URINALYSIS, COMPLETE
BILIRUBIN, UR: NEGATIVE
Bacteria: NONE SEEN
Blood: NEGATIVE
Glucose,UR: >=500 mg/dL (ref 0–75)
Ketone: NEGATIVE
Leukocyte Esterase: NEGATIVE
NITRITE: NEGATIVE
PROTEIN: NEGATIVE
Ph: 5 (ref 4.5–8.0)
Specific Gravity: 1.012 (ref 1.003–1.030)
Squamous Epithelial: 2

## 2013-05-19 LAB — CBC
HCT: 39.4 % (ref 35.0–47.0)
HGB: 12.8 g/dL (ref 12.0–16.0)
MCH: 26.5 pg (ref 26.0–34.0)
MCHC: 32.5 g/dL (ref 32.0–36.0)
MCV: 82 fL (ref 80–100)
PLATELETS: 248 10*3/uL (ref 150–440)
RBC: 4.83 10*6/uL (ref 3.80–5.20)
RDW: 14.1 % (ref 11.5–14.5)
WBC: 10.6 10*3/uL (ref 3.6–11.0)

## 2013-05-19 LAB — TROPONIN I

## 2013-05-19 NOTE — Telephone Encounter (Signed)
Spoke w/ pt.  Advised her of Dr. Donivan Scull recommendations.  She is agreeable to holding amlodipine and will call Monday w/ BP readings over the weekend.

## 2013-05-29 DIAGNOSIS — E785 Hyperlipidemia, unspecified: Secondary | ICD-10-CM | POA: Insufficient documentation

## 2013-05-29 DIAGNOSIS — G43909 Migraine, unspecified, not intractable, without status migrainosus: Secondary | ICD-10-CM | POA: Insufficient documentation

## 2013-06-06 ENCOUNTER — Ambulatory Visit: Payer: Self-pay | Admitting: Neurology

## 2013-06-06 LAB — GLUCOSE, CSF: Glucose, CSF: 118 mg/dL — ABNORMAL HIGH (ref 40–75)

## 2013-06-06 LAB — CSF CELL COUNT WITH DIFFERENTIAL
CSF Tube #: 3
EOS PCT: 0 %
Lymphocytes: 0 %
MONOCYTES/MACROPHAGES: 0 %
Neutrophils: 0 %
OTHER CELLS: 0 %
RBC (CSF): 105 /mm3
WBC (CSF): 0 /mm3

## 2013-06-06 LAB — CBC WITH DIFFERENTIAL/PLATELET
Basophil #: 0.1 10*3/uL (ref 0.0–0.1)
Basophil %: 1.2 %
EOS PCT: 3.4 %
Eosinophil #: 0.3 10*3/uL (ref 0.0–0.7)
HCT: 39.6 % (ref 35.0–47.0)
HGB: 12.8 g/dL (ref 12.0–16.0)
LYMPHS ABS: 2 10*3/uL (ref 1.0–3.6)
LYMPHS PCT: 21.4 %
MCH: 26.6 pg (ref 26.0–34.0)
MCHC: 32.2 g/dL (ref 32.0–36.0)
MCV: 82 fL (ref 80–100)
MONO ABS: 0.5 x10 3/mm (ref 0.2–0.9)
MONOS PCT: 5.8 %
NEUTROS ABS: 6.3 10*3/uL (ref 1.4–6.5)
NEUTROS PCT: 68.2 %
Platelet: 232 10*3/uL (ref 150–440)
RBC: 4.8 10*6/uL (ref 3.80–5.20)
RDW: 14 % (ref 11.5–14.5)
WBC: 9.3 10*3/uL (ref 3.6–11.0)

## 2013-06-06 LAB — COMPREHENSIVE METABOLIC PANEL
ALBUMIN: 4 g/dL (ref 3.4–5.0)
ALK PHOS: 113 U/L
Anion Gap: 6 — ABNORMAL LOW (ref 7–16)
BUN: 17 mg/dL (ref 7–18)
Bilirubin,Total: 0.4 mg/dL (ref 0.2–1.0)
CHLORIDE: 102 mmol/L (ref 98–107)
CO2: 28 mmol/L (ref 21–32)
Calcium, Total: 9.6 mg/dL (ref 8.5–10.1)
Creatinine: 1.42 mg/dL — ABNORMAL HIGH (ref 0.60–1.30)
EGFR (African American): 52 — ABNORMAL LOW
GFR CALC NON AF AMER: 45 — AB
GLUCOSE: 184 mg/dL — AB (ref 65–99)
OSMOLALITY: 278 (ref 275–301)
Potassium: 4.6 mmol/L (ref 3.5–5.1)
SGOT(AST): 32 U/L (ref 15–37)
SGPT (ALT): 106 U/L — ABNORMAL HIGH (ref 12–78)
Sodium: 136 mmol/L (ref 136–145)
Total Protein: 7.7 g/dL (ref 6.4–8.2)

## 2013-06-06 LAB — PROTEIN, CSF: Protein, CSF: 32 mg/dL (ref 15–45)

## 2013-06-06 LAB — PROTIME-INR
INR: 1
Prothrombin Time: 13 secs (ref 11.5–14.7)

## 2013-06-20 DIAGNOSIS — Z8673 Personal history of transient ischemic attack (TIA), and cerebral infarction without residual deficits: Secondary | ICD-10-CM

## 2013-06-20 DIAGNOSIS — I6930 Unspecified sequelae of cerebral infarction: Secondary | ICD-10-CM | POA: Insufficient documentation

## 2013-06-29 ENCOUNTER — Telehealth: Payer: Self-pay

## 2013-06-29 NOTE — Telephone Encounter (Signed)
Pt called and states her BP is still going low. Please call. (pt did have her readings with her).

## 2013-06-29 NOTE — Telephone Encounter (Signed)
Spoke w/ pt.  She reports that her BP has been running low, "in the range of 75-82/50" Pt reports that she has not felt syncopal, just tired.  Reports that she has been adjusting her own meds. She states that she stopped her clonidine at night, and her BP "shot back up". She then tried holding her enalapril, and again her BP rose. Advised pt against adjusting her own meds.  Attempted to discuss w/ her, but she shifted attention to her boyfriend, who is having issues and has an appt this afternoon.  She states that we will talk more when she comes in with him.

## 2013-06-29 NOTE — Telephone Encounter (Signed)
Pt would like to know if Dr. [name] can suggest anything over the phone or if she should come in for appt sooner than 6/15 in order to get her BP WNL.

## 2013-06-29 NOTE — Telephone Encounter (Signed)
Will probably need to cut doses in 1/2 rather than stopping anything Stopping clonidine can lead to rebound hypertension Will need to slowly wean meds

## 2013-06-30 NOTE — Telephone Encounter (Signed)
Left message for pt to call back  °

## 2013-06-30 NOTE — Telephone Encounter (Signed)
Spoke w/ pt.  Advised her of Dr. Donivan Scull recommendation.  She is agreeable and states that "if" she adjusts her own meds, she will not stop anything, only reduce the dosage. States that she and her boyfriend are going to the beach this weekend to relax.  Asked her to call w/ any questions or concerns.

## 2013-07-17 ENCOUNTER — Ambulatory Visit (INDEPENDENT_AMBULATORY_CARE_PROVIDER_SITE_OTHER): Payer: Medicaid Other | Admitting: Cardiovascular Disease

## 2013-07-17 ENCOUNTER — Encounter: Payer: Self-pay | Admitting: Cardiovascular Disease

## 2013-07-17 VITALS — BP 80/60 | HR 71 | Ht 62.0 in | Wt 187.2 lb

## 2013-07-17 DIAGNOSIS — R079 Chest pain, unspecified: Secondary | ICD-10-CM

## 2013-07-17 DIAGNOSIS — R4789 Other speech disturbances: Secondary | ICD-10-CM

## 2013-07-17 DIAGNOSIS — Z9884 Bariatric surgery status: Secondary | ICD-10-CM

## 2013-07-17 DIAGNOSIS — R4702 Dysphasia: Secondary | ICD-10-CM

## 2013-07-17 DIAGNOSIS — I6350 Cerebral infarction due to unspecified occlusion or stenosis of unspecified cerebral artery: Secondary | ICD-10-CM

## 2013-07-17 DIAGNOSIS — E119 Type 2 diabetes mellitus without complications: Secondary | ICD-10-CM

## 2013-07-17 DIAGNOSIS — I951 Orthostatic hypotension: Secondary | ICD-10-CM

## 2013-07-17 NOTE — Patient Instructions (Signed)
You are doing well. Please take HCTZ every other day Hold clonidine if BP runs low.   Please call us if you have new issues that need to be addressed before your next appt.  Your physician wants you to follow-up in: 6 months.  You will receive a reminder letter in the mail two months in advance. If you don't receive a letter, please call our office to schedule the follow-up appointment.

## 2013-07-17 NOTE — Assessment & Plan Note (Signed)
She has done well after her bariatric surgery. Weight has significantly decreased

## 2013-07-17 NOTE — Assessment & Plan Note (Signed)
We have encouraged continued exercise, careful diet management in an effort to lose weight. 

## 2013-07-17 NOTE — Assessment & Plan Note (Signed)
Suggested she continue on her aspirin and Plavix. No recent TIA or stroke symptoms.

## 2013-07-17 NOTE — Assessment & Plan Note (Signed)
Tomorrow she is scheduled to have barium swallow. She reports having food that sticks in her throat

## 2013-07-17 NOTE — Progress Notes (Signed)
Patient ID: [name], female    DOB: 1970/02/17, 43 y.o.   MRN: [id number]  HPI Comments: 43 year old woman with a long hx of smoking, numerous prior strokes,  history of chest pain dating back to 2005 at that time with cardiac catheterization on August 09, 2003 found to have mild plaque in the LAD, but there was no obstructive disease noted.   MUGA, which showed an ejection fraction of 51%.   Transesophageal echocardiogram in the setting of a CVA in May 2010  showed normal LV function, s/p gastric bypass surgery, Who presents for routine followup.  She states that her blood pressure has been running low. Blood pressure in the office today 80/60. He reports that this is not unusual for her. She provides a list of her blood pressure measurements over the past 2 weeks. Systolic pressure ranging from the high 70s up to 120. Frequent systolic pressures in the 80s to 90s. She does have orthostasis symptoms. She's not taking midodrine or Florinef. She is taking several medications for hypertension including HCTZ, clonidine in the evening, carvedilol, low-dose enalapril  She previously presented to the hospital December 18, 2010 with chest pain.   stress test in the hospital and showed no ischemia. Cardiac enzymes were negative. No EKG changes noted.  Hemoglobin A1c was elevated estimated at 7.9 Total cholesterol 136, LDL 71, HDL 20, normal renal function  admission to the hospital 11/01/2012 with discharge 11/02/2012 with chest pain. She ruled out with negative cardiac enzymes. Blood pressures were in the [id number] systolic range. Discharge blood pressure medications included amlodipine 10 mg daily, benazepril 40 mg daily, clonidine 0.2 mg., Coreg 3.125 mg twice a day  stroke and admission to the hospital at the end of December 2014. She had acute onset left sided weakness including arm and leg. She reports that review of MRI showed acute stroke, as well as previous strokes. She was seen by neurology and  aspirin changed to Plavix 75 mg daily. Hemoglobin A1c 8.5. Notes indicate short run of SVT while she was in the hospital and on the monitor.  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 After several months, these medications were weaned off, restarted on low-dose blood pressure medication  Echocardiogram 01/29/2013 suggest ejection fraction 40-45% CT scan of the head showing small vessel disease MRI showing acute infarct right parietal white matter, extensive chronic microvascular ischemic changes  total cholesterol 147, LDL 55, HDL 19 Echocardiogram April 2014 showed ejection fraction 50-55%, otherwise normal study Stress test 11/02/2012 showing no ischemia,  EKG shows normal sinus rhythm  with heart rate 71 beats per minute, nonspecific ST abnormality    Outpatient Encounter Prescriptions as of 07/17/2013  Medication Sig  . atorvastatin (LIPITOR) 40 MG tablet Take 40 mg by mouth daily.  Marland Kitchen buPROPion (WELLBUTRIN XL) 300 MG 24 hr tablet Take 300 mg by mouth daily.  . Canagliflozin 300 MG TABS Take 300 mg by mouth daily.  . carvedilol (COREG) 6.25 MG tablet Take 1 tablet (6.25 mg total) by mouth 2 (two) times daily.  . cloNIDine (CATAPRES) 0.2 MG tablet Take 0.2 mg by mouth daily.  . clopidogrel (PLAVIX) 75 MG tablet Take 75 mg by mouth daily with breakfast.  . cyclobenzaprine (FLEXERIL) 10 MG tablet Take 10 mg by mouth 3 (three) times daily as needed for muscle spasms.  . enalapril (VASOTEC) 5 MG tablet Take 5 mg by mouth daily.  Marland Kitchen escitalopram (LEXAPRO)  20 MG tablet Take 20 mg by mouth daily.  Marland Kitchen gabapentin (NEURONTIN) 300 MG capsule Take 1 capsule (300 mg total) by mouth 3 (three) times daily.  Marland Kitchen glucose blood (ONE TOUCH ULTRA TEST) test strip 1 each by Other route. Use as instructed  Up to 6 times daily   . hydrochlorothiazide (HYDRODIURIL) 25 MG tablet Take 1 tablet (25 mg total) by mouth daily.  .  insulin aspart (NOVOLOG) 100 UNIT/ML injection Inject 4-15 Units into the skin 3 (three) times daily as needed for high blood sugar. Takes anywhere from 4-15 units as directed per sliding scale  . insulin detemir (LEVEMIR) 100 UNIT/ML injection Inject 25 Units into the skin at bedtime.  . lansoprazole (PREVACID) 15 MG capsule Take 15 mg by mouth 2 (two) times daily before a meal.  . metFORMIN (GLUCOPHAGE) 1000 MG tablet Take 1,000 mg by mouth 2 (two) times daily with a meal.  . Multiple Vitamin (MULTIVITAMIN) tablet Take 1 tablet by mouth daily.   . promethazine (PHENERGAN) 25 MG tablet Take 25 mg by mouth every 6 (six) hours as needed. For nausea  . temazepam (RESTORIL) 15 MG capsule Take 1 capsule (15 mg total) by mouth at bedtime as needed.     Review of Systems  Constitutional: Negative.   HENT: Negative.   Eyes: Negative.   Respiratory: Negative.   Cardiovascular: Negative.   Gastrointestinal: Negative.   Endocrine: Negative.   Musculoskeletal: Positive for gait problem.  Skin: Negative.   Allergic/Immunologic: Negative.   Neurological: Negative.        Left arm, left leg weakness  Hematological: Negative.   Psychiatric/Behavioral: Positive for dysphoric mood.  All other systems reviewed and are negative.   BP 80/60  Pulse 71  Ht 5\' 2"  (1.575 m)  Wt 187 lb 4 oz (84.936 kg)  BMI 34.24 kg/m2  Physical Exam  Nursing note and vitals reviewed. Constitutional: She is oriented to person, place, and time. She appears well-developed and well-nourished.  HENT:  Head: Normocephalic.  Nose: Nose normal.  Mouth/Throat: Oropharynx is clear and moist.  Eyes: Conjunctivae are normal. Pupils are equal, round, and reactive to light.  Neck: Normal range of motion. Neck supple. No JVD present.  Cardiovascular: Normal rate, regular rhythm, S1 normal, S2 normal, normal heart sounds and intact distal pulses.  Exam reveals no gallop and no friction rub.   No murmur heard. Pulmonary/Chest:  Effort normal and breath sounds normal. No respiratory distress. She has no wheezes. She has no rales. She exhibits no tenderness.  Abdominal: Soft. Bowel sounds are normal. She exhibits no distension. There is no tenderness.  Musculoskeletal: Normal range of motion. She exhibits no edema and no tenderness.  Lymphadenopathy:    She has no cervical adenopathy.  Neurological: She is alert and oriented to person, place, and time. Coordination normal.  Skin: Skin is warm and dry. No rash noted. No erythema.  Psychiatric: She has a normal mood and affect. Her behavior is normal. Judgment and thought content normal.    Assessment and Plan

## 2013-07-17 NOTE — Assessment & Plan Note (Signed)
Low blood pressure on today's visit. We have recommended she take HCTZ every other day, consider holding the evening clonidine. Further medication modifications could be made based on new blood pressure numbers

## 2013-07-18 ENCOUNTER — Ambulatory Visit: Payer: Self-pay | Admitting: Gastroenterology

## 2013-07-31 ENCOUNTER — Ambulatory Visit: Payer: Self-pay | Admitting: Gastroenterology

## 2013-08-02 NOTE — Telephone Encounter (Signed)
This encounter was created in error - please disregard.

## 2013-08-08 ENCOUNTER — Telehealth: Payer: Self-pay

## 2013-08-08 NOTE — Telephone Encounter (Signed)
Request from [place] , sent to [place] on 08/08/2013 .

## 2013-09-05 ENCOUNTER — Telehealth: Payer: Self-pay

## 2013-09-05 ENCOUNTER — Telehealth: Payer: Self-pay | Admitting: *Deleted

## 2013-09-05 NOTE — Telephone Encounter (Signed)
Faxed cardiac clearance for pt to proceed w/ upper endoscopy on 09/12/13 under moderate sedation to [facility] at [phone]. Per Dr. [name], pt is cleared to proceed w/ procedure and to hold Plavix for 5 days, but he recommends that pt take aspirin 81mg  while off of Plavix.

## 2013-09-05 NOTE — Telephone Encounter (Signed)
Amy Nurse with Dr. Rayann Heman at [facility] needs clearance to hold plavix for 5 days. Please fax stat having egd next week 8/11

## 2013-09-05 NOTE — Telephone Encounter (Signed)
Request from [place] , sent to [facility] on 09/05/2013 Also mailed release information to pt

## 2013-09-12 ENCOUNTER — Ambulatory Visit: Payer: Self-pay | Admitting: Gastroenterology

## 2013-09-13 LAB — PATHOLOGY REPORT

## 2013-09-27 ENCOUNTER — Ambulatory Visit: Payer: Self-pay | Admitting: Gastroenterology

## 2013-10-05 ENCOUNTER — Telehealth: Payer: Self-pay

## 2013-10-05 ENCOUNTER — Ambulatory Visit: Payer: Self-pay | Admitting: Gastroenterology

## 2013-10-05 NOTE — Telephone Encounter (Signed)
If she has been consistently documenting low blood pressures (systolics < [id number]), then she can cut back on her coreg to 3.125mg  bid (cut the 6.25mg  tabs in 1/2) and on days that BP is running high, she can take a full tablet.

## 2013-10-05 NOTE — Telephone Encounter (Signed)
Pt states her BP is low, dizzy, and feels as if she is going to pass out, had to pull over and have someone come and get her. BP is 82/69.

## 2013-10-05 NOTE — Telephone Encounter (Signed)
Spoke w/ pt.  She reports that she has been having spells of lightheadedness & dizziness for the past 3-4 weeks.  Reports that they occur whether she has taken her HCTZ or note.  Pt reports that she is no longer taking clonidine.  She would like to know if any changes can be made to bring her BP up, as most of these episodes have occurred while she was driving.

## 2013-10-05 NOTE — Telephone Encounter (Signed)
Spoke w/ pt.  Advised her of Dr. Gollan's recommendation. She verbalizes understanding and will call back w/ any questions or concerns.  

## 2013-10-12 ENCOUNTER — Observation Stay: Payer: Self-pay | Admitting: Internal Medicine

## 2013-10-12 LAB — CBC WITH DIFFERENTIAL/PLATELET
BASOS ABS: 0.1 10*3/uL (ref 0.0–0.1)
BASOS PCT: 1.3 %
EOS ABS: 0.5 10*3/uL (ref 0.0–0.7)
Eosinophil %: 5.1 %
HCT: 39.2 % (ref 35.0–47.0)
HGB: 12.5 g/dL (ref 12.0–16.0)
LYMPHS ABS: 2.3 10*3/uL (ref 1.0–3.6)
Lymphocyte %: 22.4 %
MCH: 27 pg (ref 26.0–34.0)
MCHC: 31.9 g/dL — AB (ref 32.0–36.0)
MCV: 85 fL (ref 80–100)
Monocyte #: 0.5 x10 3/mm (ref 0.2–0.9)
Monocyte %: 5 %
Neutrophil #: 6.7 10*3/uL — ABNORMAL HIGH (ref 1.4–6.5)
Neutrophil %: 66.2 %
Platelet: 263 10*3/uL (ref 150–440)
RBC: 4.64 10*6/uL (ref 3.80–5.20)
RDW: 13.4 % (ref 11.5–14.5)
WBC: 10.1 10*3/uL (ref 3.6–11.0)

## 2013-10-12 LAB — BASIC METABOLIC PANEL
Anion Gap: 12 (ref 7–16)
BUN: 26 mg/dL — AB (ref 7–18)
CHLORIDE: 106 mmol/L (ref 98–107)
Calcium, Total: 8.1 mg/dL — ABNORMAL LOW (ref 8.5–10.1)
Co2: 15 mmol/L — ABNORMAL LOW (ref 21–32)
Creatinine: 1.88 mg/dL — ABNORMAL HIGH (ref 0.60–1.30)
EGFR (African American): 37 — ABNORMAL LOW
EGFR (Non-African Amer.): 32 — ABNORMAL LOW
GLUCOSE: 207 mg/dL — AB (ref 65–99)
Osmolality: 277 (ref 275–301)
POTASSIUM: 4.5 mmol/L (ref 3.5–5.1)
SODIUM: 133 mmol/L — AB (ref 136–145)

## 2013-10-12 LAB — URINALYSIS, COMPLETE
BILIRUBIN, UR: NEGATIVE
Blood: NEGATIVE
Ketone: NEGATIVE
LEUKOCYTE ESTERASE: NEGATIVE
NITRITE: NEGATIVE
PROTEIN: NEGATIVE
Ph: 5 (ref 4.5–8.0)
Specific Gravity: 1.011 (ref 1.003–1.030)
Squamous Epithelial: 4
WBC UR: 2 /HPF (ref 0–5)

## 2013-10-12 LAB — TROPONIN I
Troponin-I: <0.02 ng/mL
Troponin-I: <0.02 ng/mL

## 2013-10-13 ENCOUNTER — Telehealth: Payer: Self-pay | Admitting: *Deleted

## 2013-10-13 DIAGNOSIS — R55 Syncope and collapse: Secondary | ICD-10-CM

## 2013-10-13 LAB — BASIC METABOLIC PANEL
ANION GAP: 8 (ref 7–16)
BUN: 26 mg/dL — ABNORMAL HIGH (ref 7–18)
Calcium, Total: 8.1 mg/dL — ABNORMAL LOW (ref 8.5–10.1)
Chloride: 113 mmol/L — ABNORMAL HIGH (ref 98–107)
Co2: 23 mmol/L (ref 21–32)
Creatinine: 1.71 mg/dL — ABNORMAL HIGH (ref 0.60–1.30)
EGFR (African American): 41 — ABNORMAL LOW
EGFR (Non-African Amer.): 36 — ABNORMAL LOW
GLUCOSE: 238 mg/dL — AB (ref 65–99)
OSMOLALITY: 299 (ref 275–301)
Potassium: 4.3 mmol/L (ref 3.5–5.1)
Sodium: 144 mmol/L (ref 136–145)

## 2013-10-13 LAB — TROPONIN I: Troponin-I: <0.02 ng/mL

## 2013-10-13 NOTE — Telephone Encounter (Signed)
Patient called and she is at [facility] admitted last night due to lbp and passing out.

## 2013-10-13 NOTE — Telephone Encounter (Signed)
Dr. [name] is aware.  No consult has been requested as of this am.

## 2013-10-18 ENCOUNTER — Encounter: Payer: Self-pay | Admitting: *Deleted

## 2013-10-20 ENCOUNTER — Ambulatory Visit: Payer: Self-pay | Admitting: Cardiothoracic Surgery

## 2013-10-20 ENCOUNTER — Other Ambulatory Visit: Payer: Self-pay

## 2013-10-20 DIAGNOSIS — R55 Syncope and collapse: Secondary | ICD-10-CM

## 2013-10-20 DIAGNOSIS — R002 Palpitations: Secondary | ICD-10-CM

## 2013-10-25 ENCOUNTER — Encounter: Payer: Self-pay | Admitting: Physician Assistant

## 2013-10-26 ENCOUNTER — Ambulatory Visit: Payer: Self-pay | Admitting: Cardiothoracic Surgery

## 2013-10-26 ENCOUNTER — Ambulatory Visit: Payer: Medicaid Other | Admitting: Nurse Practitioner

## 2013-10-27 ENCOUNTER — Ambulatory Visit (INDEPENDENT_AMBULATORY_CARE_PROVIDER_SITE_OTHER): Payer: Medicaid Other | Admitting: Physician Assistant

## 2013-10-27 ENCOUNTER — Encounter: Payer: Self-pay | Admitting: Physician Assistant

## 2013-10-27 VITALS — BP 118/84 | HR 93 | Ht 62.0 in | Wt 190.2 lb

## 2013-10-27 DIAGNOSIS — I951 Orthostatic hypotension: Secondary | ICD-10-CM

## 2013-10-27 DIAGNOSIS — Z8673 Personal history of transient ischemic attack (TIA), and cerebral infarction without residual deficits: Secondary | ICD-10-CM

## 2013-10-27 DIAGNOSIS — R55 Syncope and collapse: Secondary | ICD-10-CM

## 2013-10-27 DIAGNOSIS — I2510 Atherosclerotic heart disease of native coronary artery without angina pectoris: Secondary | ICD-10-CM

## 2013-10-27 NOTE — Progress Notes (Signed)
Patient Name: [name], [name] February 16, 1970, MRN [id number]  Date of Encounter: 10/27/2013  Primary Care Provider:  Carlyn Reichert, MD Primary Cardiologist:  Dr. [name], MD  Patient Profile:  43 y.o. female with history of several prior CVAs, history of chronic systolic CHF, recurrent chest pain, IDDM, HTN, HLD, obesity, MS, and syncope who presents today for hospital follow up after recent admission at [facility] from 9/10-9/11 for syncope.    Problem List:   Past Medical History  Diagnosis Date  . Hypertension   . Hyperlipidemia   . Obesity   . Depression   . Anxiety   . IDDM (insulin dependent diabetes mellitus)   . Colon polyp   . GERD (gastroesophageal reflux disease)   . History of left heart catheterization (LHC)     a. cath 08/09/2003: mild LAD plaque, no obs dzs noted; b. 12/18/10 stress test w/o ischemia.   . Worsening headaches   . Slurred speech   . Depression   . CVA (cerebral vascular accident)     a. 06/2008-TEE nl LV fxn; b. 01/2013 - notes indicate short run of SVT at that time  . Multiple sclerosis 2015  . Syncope   . Chronic systolic CHF (congestive heart failure)     a. echo 01/29/2013 EF 40-45%, basal anteroseptal & mid anteroseptal segments abnl; b echo 05/2012 EF 50-55%, mildly dilated LA, nl RVSP  . Orthostatic hypotension   . Aortic arch aneurysm   . Cardiomyopathy   . Acute on chronic renal failure   . MS (mitral stenosis)    Past Surgical History  Procedure Laterality Date  . Appendectomy    . Right oophorectomy    . Tubal ligation      R tube removed still w/ Left  . Dilation and curettage of uterus    . Gastric bypass  09/2009  . S/p gastric bypass      2011 [facility]  . Cardiac catheterization      [facility]      Allergies:  Allergies  Allergen Reactions  . Tramadol     MOUTH BURNING AND CONFUSION     HPI:  43 y.o. female with the above problem list here for hospital follow up after recent admission at [facility] from  9/10-9/11 for syncope.   Patient with history of intermittent chest pain dating back to 2005 during which she underwent cardiac cath 08/09/2003 demonstrating mild plaque in the LAD, but no obstructive disease noted. She unfortunately suffered a CVA May 2010. TEE revealed normal LV function (no comment in note on thrombus status or bubble study) s/p gastric bypass study. She again presented with chest pain 12/18/2010, CE negative, no EKG changes. Stress test showed no ischemia. Again admitted with chest pain and acute onset of left sided weakness, including arm and leg12/2014, CE negative - notes indicate short run of SVT. MRI during that admission revealed acute stroke, as well as previous strokes. Neuro changed aspirin to Plavix 75 mg daily. Echo at that time revealed EF 40-45%. CT of the head showed small vessel disease. MRI with acute infarct of the right parietal white matter, extensive chronic microvascular ischemic changes. Factor V and anticardiolipin negative. 51 Hr Holter showed likely sinus tach or atrial tach. Follow up echo 05/2012 with EF 50-55%, o/w nl. Her last stress test was 11/02/2012 showing no ischemia.    She also has a history of hypertension, but complains of her BP running low. BP at her last OV 07/17/13 was 80/60  and this is apparently not unusual for her. She was noted to have orthostatic symptoms at that time. No taking midodrine or Florinef at that time. At that time antihypertensives included HCTZ, clonidine in the evening, carvedilol, and low-dose enalapril. Her HCTZ was changed to every other day and clonidine was held. She called during the first part of September with lightheadedness/dizziness x 3-4 weeks (BP 82/69). These were occuring regarding of taking HCTZ or not and she was no longer taking clonidine. She was advised to cut back her Coreg to 3.125 mg bid on days her BP was running <110 SBP.      She was recently admitted to [facility] 10/12/13 for low BP/syncope. Symptoms related to  orthostasis. No preceding events. No palpitations, chest pain, or shortness of breath. Had no postictal confusion, tongue biting, or loss of bowel or bladder function. Upon arrival to [facility] ER she was noted to be hypotensive with SBP in the 80s that improved with 2 liters NS. EKG without abnormalities. TnI negative x 3. SCr 1.88-->1.71. K+ 4.5-->4.3. She was discharged with a 48 hr Holter monitor that showed NSR with frequent periods of sinus tach, rare PVC.  She does have chronic headaches and has been diagnosed with Cadasil, which is a white matter disease with demyelination, presenting with chronic headaches.    Today she comes in stating she is feeling better. She has had one dizzy episode since her hospital discharge that lasted 10 minutes. She was laying in her bed when the episode occurred. She did not take her pulse or BP. 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 Per patient she believes this is what caused her two CVAs. She has been taper down on her antihypertensives to only Coreg 3.125 mg bid. She denies any chest pain, palpitations, SOB, DOE, diaphoresis, edema, orthopnea, PND, nausea, or vomiting.           Home Medications:  Prior to Admission medications   Medication Sig Start Date End Date Taking? Authorizing Provider  atorvastatin (LIPITOR) 40 MG tablet Take 40 mg by mouth daily.    Historical Provider, MD  buPROPion (WELLBUTRIN XL) 300 MG 24 hr tablet Take 300 mg by mouth daily.    Historical Provider, MD  Canagliflozin 300 MG TABS Take 300 mg by mouth daily.    Historical Provider, MD  carvedilol (COREG) 6.25 MG tablet Take 1 tablet (6.25 mg total) by mouth 2 (two) times daily. 04/17/13   Minna Merritts, MD  clopidogrel (PLAVIX) 75 MG tablet Take 75 mg by mouth daily with breakfast.    Historical Provider, MD  cyclobenzaprine (FLEXERIL) 10 MG tablet Take 10 mg by mouth 3 (three) times daily  as needed for muscle spasms.    Historical Provider, MD  escitalopram (LEXAPRO) 20 MG tablet Take 20 mg by mouth daily.    Historical Provider, MD  gabapentin (NEURONTIN) 300 MG capsule Take 1 capsule (300 mg total) by mouth 3 (three) times daily. 02/02/12 07/17/13  Midge Minium, MD  glucose blood (ONE TOUCH ULTRA TEST) test strip 1 each by Other route. Use as instructed  Up to 6 times daily     Historical Provider, MD  hydrochlorothiazide (HYDRODIURIL) 25 MG tablet Take 1 tablet (25 mg total) by mouth daily. 04/13/13   Minna Merritts, MD  insulin aspart (NOVOLOG) 100 UNIT/ML injection Inject 4-15 Units into the skin 3 (three) times daily as needed for high blood  sugar. Takes anywhere from 4-15 units as directed per sliding scale    Historical Provider, MD  insulin detemir (LEVEMIR) 100 UNIT/ML injection Inject 25 Units into the skin at bedtime.    Historical Provider, MD  lansoprazole (PREVACID) 15 MG capsule Take 15 mg by mouth 2 (two) times daily before a meal.    Historical Provider, MD  Multiple Vitamin (MULTIVITAMIN) tablet Take 1 tablet by mouth daily.     Historical Provider, MD  promethazine (PHENERGAN) 25 MG tablet Take 25 mg by mouth every 6 (six) hours as needed. For nausea 12/22/10   Midge Minium, MD  temazepam (RESTORIL) 15 MG capsule Take 1 capsule (15 mg total) by mouth at bedtime as needed. 06/23/11   Midge Minium, MD     Weights: Filed Weights   10/27/13 1311  Weight: 190 lb 4 oz (86.297 kg)     Review of Systems:  All other systems reviewed and are otherwise negative except as noted above.  Physical Exam:  Blood pressure 118/84, pulse 93, height 5\' 2"  (1.575 m), weight 190 lb 4 oz (86.297 kg).  General: Pleasant, NAD. She reported dizziness during exam - HR regular and 80s - BP 122/88.  Psych: Normal affect. Neuro: Alert and oriented X 3. Moves all extremities spontaneously. HEENT: Normal  Neck: Supple without bruits or JVD. Lungs:  Resp regular  and unlabored, CTA.  Heart: RRR no s3, s4, or murmurs. Abdomen: Soft, non-tender, non-distended, BS + x 4.  Extremities: No clubbing, cyanosis or edema. DP/PT/Radials 2+ and equal bilaterally.   Accessory Clinical Findings:  EKG - NSR, 93, slight flat st depression leads leads I & V6, slight up slopping st depression V4    Assessment & Plan:  1. History of syncopal episodes/orthostatic hypotension: -Symptoms appear to date back to her CVA in 01/2013. After this CVA many of her medications were stopped and she apparently restarted them all back at once. This in turn --> hypotension/orthostatic hypotension. She continues to takes varying medications and medication doses at home, possibly exacerbating her symptoms. She feels strongly that her symptoms are somehow related to this newly diagnosed congential vascular malformation, although I do not see how she was completely asymptomatic for her whole life up until 2010/2014. She has undergone 48 hr Holter monitoring x 2 - neither revealing a-fib. I discussed with her the possibility of further monitoring including 30 day or ILR, she would like to hold off on this until she has her vascular work performed and if she is still symptomatic she will revisit further cardiac monitoring. She has also undergone some coagulopathy evaluation, and she declined further evaluation at this time.  -Continue Coreg 3.125 mg bid   2. History of CVA x 2 (06/2008 & 01/2011): -See #1 -Continue daily aspirin 81 mg and Plavix 75 mg daily  3. CAD: -Continue aspirin and Plavix -On Lipitor 40 mg -No chest pain symptoms  4. IDDM: -She reports her A1C has improved to 7.1%     Christell Faith, PA-C [facility] [address], [place] [address] [phone] [place] 10/27/2013, 3:03 PM

## 2013-10-27 NOTE — Patient Instructions (Signed)
Your physician recommends that you schedule a follow-up appointment in:  Follow up with Dr. [name] in November

## 2013-11-02 ENCOUNTER — Ambulatory Visit: Payer: Self-pay | Admitting: Cardiothoracic Surgery

## 2013-11-02 DIAGNOSIS — G35 Multiple sclerosis: Secondary | ICD-10-CM | POA: Insufficient documentation

## 2013-11-07 ENCOUNTER — Telehealth: Payer: Self-pay | Admitting: Cardiovascular Disease

## 2013-11-07 NOTE — Telephone Encounter (Signed)
Spoke w/ pt.  She reports that she is scheduled to have surgery next Wed at [facility]. Reports that Dr. Genevive Bi feels that this will "fix her problem".   She will have her husband call after the surgery to update [place] on how she is doing.

## 2013-11-07 NOTE — Telephone Encounter (Signed)
Pt wanted to let [facility] and Dr.Gollan know that pt has been scheduled for surgery. For she had aneurysm behind her heart, dr over there said it wasn't causing the storkes but pt keeps on having them. Pt wanted to be called back as well had more to tell nurse.

## 2013-11-17 DIAGNOSIS — Q278 Other specified congenital malformations of peripheral vascular system: Secondary | ICD-10-CM | POA: Insufficient documentation

## 2013-11-22 ENCOUNTER — Encounter: Payer: Self-pay | Admitting: Physical Medicine & Rehabilitation

## 2013-12-20 ENCOUNTER — Telehealth: Payer: Self-pay

## 2013-12-20 NOTE — Telephone Encounter (Signed)
Spoke w/ pt.  She reports that she cancelled her last appt, as she cannot afford the co-pay. She recently had surgery at [facility] her that we do not have these records.  She will try to have these faxed over for her chart.  She reports that her HR has been running high.  On her BP monitor, she got a reading of 116.  She states that her HR typically "doesn't run under 100, but [name] was concerned about it". She reports episodes in which she can feel her heart beating fast, she does not check the rate, she lies down until it passes.  She states that she does not want her husband to know that something is going on, as she is afraid he will not let her drive.  She would like to know if there is something that can be done over the phone, as she cannot afford to come in for an EKG.  Please advise.  Thank you.

## 2013-12-20 NOTE — Telephone Encounter (Signed)
Left message for pt call back:  

## 2013-12-20 NOTE — Telephone Encounter (Signed)
Pt is returning your call

## 2013-12-20 NOTE — Telephone Encounter (Signed)
Pt would like to talk with a nurse, would not give a reason, states it is personal.

## 2013-12-20 NOTE — Telephone Encounter (Signed)
Left message for pt to call back  °

## 2013-12-21 NOTE — Telephone Encounter (Signed)
Left message for pt to call back  °

## 2013-12-21 NOTE — Telephone Encounter (Signed)
Spoke w/ pt.  Advised her of Dr. Donivan Scull recommendation.  She verbalizes understanding and is agreeable to plan.  Asked her to monitor her HR and BP, and to call me next week to let me know how she is doing.

## 2013-12-21 NOTE — Telephone Encounter (Signed)
Would increase the carvedilol slowly up to 6.25 mill grams twice a day, Increase up to 12.5 mg twice a day in one week if heart rate still elevated  We'll have to hold HCTZ, clonidine given low blood pressure in the past Uncertain if she is taking enalapril

## 2013-12-22 ENCOUNTER — Ambulatory Visit: Payer: Medicaid Other | Admitting: Cardiovascular Disease

## 2013-12-27 ENCOUNTER — Ambulatory Visit: Payer: Medicaid Other | Admitting: Physical Medicine & Rehabilitation

## 2013-12-29 ENCOUNTER — Inpatient Hospital Stay: Payer: Self-pay | Admitting: Internal Medicine

## 2013-12-29 DIAGNOSIS — R000 Tachycardia, unspecified: Secondary | ICD-10-CM

## 2013-12-29 LAB — DRUG SCREEN, URINE
AMPHETAMINES, UR SCREEN: NEGATIVE (ref ?–1000)
Barbiturates, Ur Screen: NEGATIVE (ref ?–200)
Benzodiazepine, Ur Scrn: POSITIVE (ref ?–200)
Cannabinoid 50 Ng, Ur ~~LOC~~: NEGATIVE (ref ?–50)
Cocaine Metabolite,Ur ~~LOC~~: NEGATIVE (ref ?–300)
MDMA (Ecstasy)Ur Screen: POSITIVE (ref ?–500)
METHADONE, UR SCREEN: NEGATIVE (ref ?–300)
Opiate, Ur Screen: NEGATIVE (ref ?–300)
PHENCYCLIDINE (PCP) UR S: NEGATIVE (ref ?–25)
Tricyclic, Ur Screen: NEGATIVE (ref ?–1000)

## 2013-12-29 LAB — TROPONIN I: Troponin-I: <0.02 ng/mL

## 2013-12-29 LAB — COMPREHENSIVE METABOLIC PANEL
ALBUMIN: 4.2 g/dL (ref 3.4–5.0)
Alkaline Phosphatase: 97 U/L
Anion Gap: 5 — ABNORMAL LOW (ref 7–16)
BILIRUBIN TOTAL: 0.3 mg/dL (ref 0.2–1.0)
BUN: 27 mg/dL — ABNORMAL HIGH (ref 7–18)
CALCIUM: 9.6 mg/dL (ref 8.5–10.1)
Chloride: 108 mmol/L — ABNORMAL HIGH (ref 98–107)
Co2: 26 mmol/L (ref 21–32)
Creatinine: 1.81 mg/dL — ABNORMAL HIGH (ref 0.60–1.30)
EGFR (African American): 39 — ABNORMAL LOW
GFR CALC NON AF AMER: 32 — AB
Glucose: 64 mg/dL — ABNORMAL LOW (ref 65–99)
OSMOLALITY: 281 (ref 275–301)
POTASSIUM: 3.9 mmol/L (ref 3.5–5.1)
SGOT(AST): 24 U/L (ref 15–37)
SGPT (ALT): 36 U/L
SODIUM: 139 mmol/L (ref 136–145)
Total Protein: 7.6 g/dL (ref 6.4–8.2)

## 2013-12-29 LAB — CBC
HCT: 43.8 % (ref 35.0–47.0)
HGB: 13.9 g/dL (ref 12.0–16.0)
MCH: 26.2 pg (ref 26.0–34.0)
MCHC: 31.7 g/dL — ABNORMAL LOW (ref 32.0–36.0)
MCV: 83 fL (ref 80–100)
PLATELETS: 292 10*3/uL (ref 150–440)
RBC: 5.29 10*6/uL — AB (ref 3.80–5.20)
RDW: 14.6 % — ABNORMAL HIGH (ref 11.5–14.5)
WBC: 14.7 10*3/uL — AB (ref 3.6–11.0)

## 2013-12-29 LAB — URINALYSIS, COMPLETE
Bacteria: NONE SEEN
Bilirubin,UR: NEGATIVE
Blood: NEGATIVE
Glucose,UR: >=500 mg/dL (ref 0–75)
Ketone: NEGATIVE
Leukocyte Esterase: NEGATIVE
Nitrite: NEGATIVE
Ph: 5 (ref 4.5–8.0)
Protein: NEGATIVE
Specific Gravity: 1.012 (ref 1.003–1.030)
Squamous Epithelial: 3

## 2013-12-29 LAB — BASIC METABOLIC PANEL
Anion Gap: 8 (ref 7–16)
BUN: 24 mg/dL — AB (ref 7–18)
CALCIUM: 7.9 mg/dL — AB (ref 8.5–10.1)
Chloride: 108 mmol/L — ABNORMAL HIGH (ref 98–107)
Co2: 20 mmol/L — ABNORMAL LOW (ref 21–32)
Creatinine: 1.89 mg/dL — ABNORMAL HIGH (ref 0.60–1.30)
EGFR (Non-African Amer.): 31 — ABNORMAL LOW
GFR CALC AF AMER: 37 — AB
Glucose: 319 mg/dL — ABNORMAL HIGH (ref 65–99)
Osmolality: 288 (ref 275–301)
Potassium: 4.4 mmol/L (ref 3.5–5.1)
Sodium: 136 mmol/L (ref 136–145)

## 2013-12-29 LAB — PROTIME-INR
INR: 1
Prothrombin Time: 13 secs (ref 11.5–14.7)

## 2013-12-29 LAB — T4, FREE: Free Thyroxine: 0.75 ng/dL — ABNORMAL LOW (ref 0.76–1.46)

## 2013-12-29 LAB — TSH: Thyroid Stimulating Horm: 6.69 u[IU]/mL — ABNORMAL HIGH

## 2013-12-29 LAB — MAGNESIUM
Magnesium: 1.7 mg/dL — ABNORMAL LOW
Magnesium: 1.6 mg/dL — ABNORMAL LOW

## 2013-12-29 LAB — PHOSPHORUS: Phosphorus: 2.4 mg/dL — ABNORMAL LOW (ref 2.5–4.9)

## 2013-12-30 DIAGNOSIS — E039 Hypothyroidism, unspecified: Secondary | ICD-10-CM | POA: Insufficient documentation

## 2013-12-30 LAB — CBC WITH DIFFERENTIAL/PLATELET
Basophil #: 0 10*3/uL (ref 0.0–0.1)
Basophil %: 0.2 %
Eosinophil #: 0 10*3/uL (ref 0.0–0.7)
Eosinophil %: 0.1 %
HCT: 40.1 % (ref 35.0–47.0)
HGB: 12.8 g/dL (ref 12.0–16.0)
Lymphocyte #: 0.7 10*3/uL — ABNORMAL LOW (ref 1.0–3.6)
Lymphocyte %: 6.1 %
MCH: 26 pg (ref 26.0–34.0)
MCHC: 31.9 g/dL — ABNORMAL LOW (ref 32.0–36.0)
MCV: 82 fL (ref 80–100)
MONO ABS: 0.1 x10 3/mm — AB (ref 0.2–0.9)
Monocyte %: 1.1 %
NEUTROS ABS: 11.2 10*3/uL — AB (ref 1.4–6.5)
NEUTROS PCT: 92.5 %
PLATELETS: 251 10*3/uL (ref 150–440)
RBC: 4.92 10*6/uL (ref 3.80–5.20)
RDW: 14.7 % — ABNORMAL HIGH (ref 11.5–14.5)
WBC: 12.1 10*3/uL — ABNORMAL HIGH (ref 3.6–11.0)

## 2013-12-30 LAB — COMPREHENSIVE METABOLIC PANEL
ALK PHOS: 87 U/L
Albumin: 3.5 g/dL (ref 3.4–5.0)
Anion Gap: 6 — ABNORMAL LOW (ref 7–16)
BUN: 23 mg/dL — AB (ref 7–18)
Bilirubin,Total: 0.2 mg/dL (ref 0.2–1.0)
Calcium, Total: 8.6 mg/dL (ref 8.5–10.1)
Chloride: 107 mmol/L (ref 98–107)
Co2: 23 mmol/L (ref 21–32)
Creatinine: 1.75 mg/dL — ABNORMAL HIGH (ref 0.60–1.30)
GFR CALC AF AMER: 40 — AB
GFR CALC NON AF AMER: 33 — AB
Glucose: 199 mg/dL — ABNORMAL HIGH (ref 65–99)
Osmolality: 281 (ref 275–301)
Potassium: 4.8 mmol/L (ref 3.5–5.1)
SGOT(AST): 14 U/L — ABNORMAL LOW (ref 15–37)
SGPT (ALT): 33 U/L
SODIUM: 136 mmol/L (ref 136–145)
TOTAL PROTEIN: 7.1 g/dL (ref 6.4–8.2)

## 2013-12-30 LAB — MAGNESIUM: MAGNESIUM: 2.5 mg/dL — AB

## 2013-12-30 LAB — HEMOGLOBIN A1C: Hemoglobin A1C: 6.8 % — ABNORMAL HIGH (ref 4.2–6.3)

## 2013-12-30 LAB — LIPID PANEL
Cholesterol: 197 mg/dL (ref 0–200)
HDL Cholesterol: 28 mg/dL — ABNORMAL LOW (ref 40–60)
Ldl Cholesterol, Calc: 117 mg/dL — ABNORMAL HIGH (ref 0–100)
Triglycerides: 261 mg/dL — ABNORMAL HIGH (ref 0–200)
VLDL CHOLESTEROL, CALC: 52 mg/dL — AB (ref 5–40)

## 2014-01-03 LAB — CULTURE, BLOOD (SINGLE)

## 2014-01-13 IMAGING — CT CT ABD-PELV W/ CM
1 of 2 series · 15 of 32 positions shown, 19 images · non-contrast
Comparison: none

REASON FOR EXAM: (1) LLQ pain; (2) LLQ pain
COMMENTS:

[Series 2: 3mm soft tissue · axial · 0.74mm/px · z∈[-549,-60]mm · 15 of 179 slices shown, 19 images]
[im 8/179  soft-tissue]
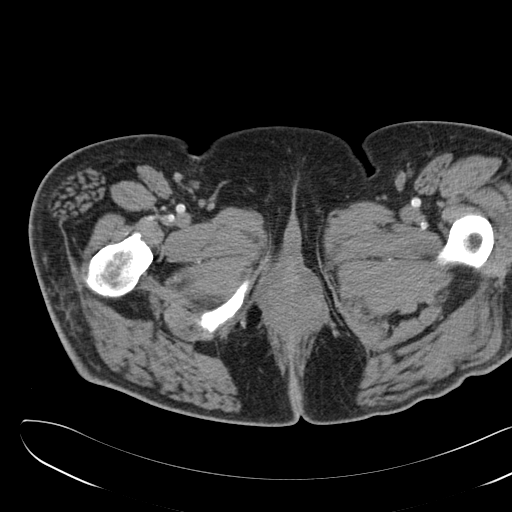
[im 8/179  bone]
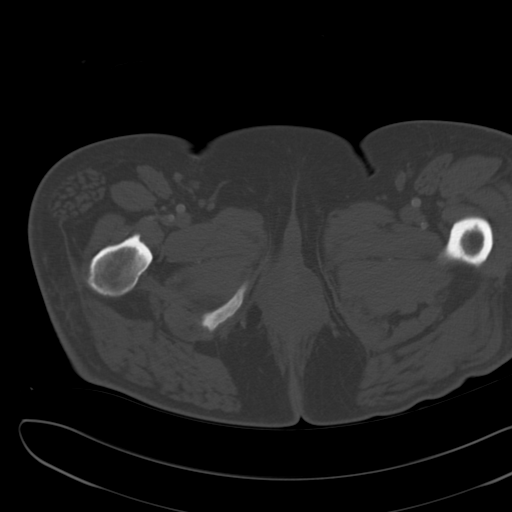
[im 24/179  soft-tissue]
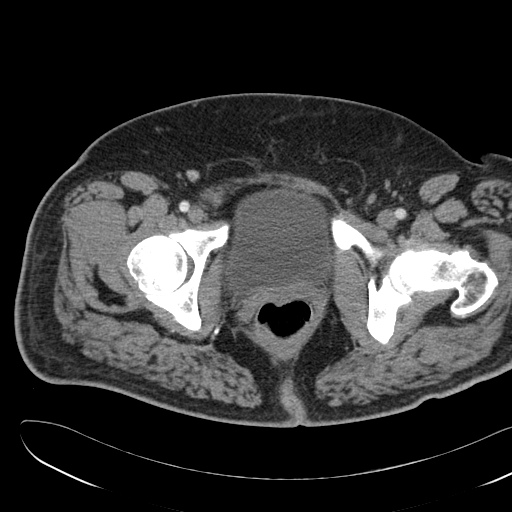
[im 39/179  soft-tissue]
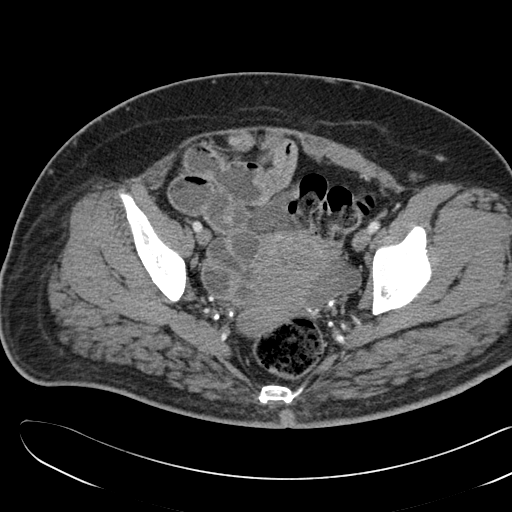
[im 47/179  soft-tissue]
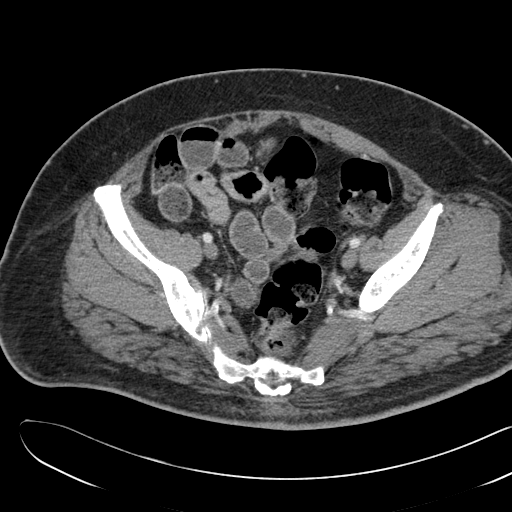
[im 62/179  soft-tissue]
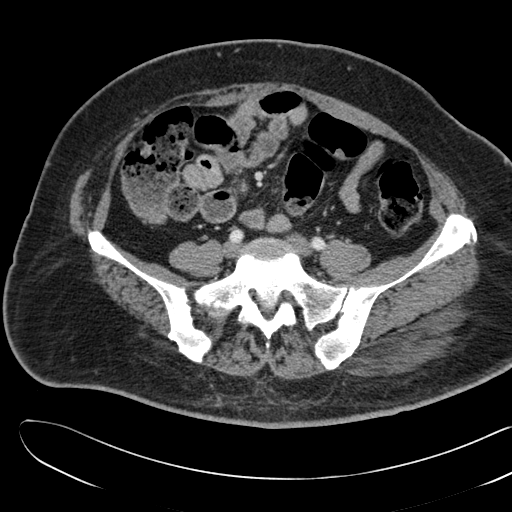
[im 78/179  soft-tissue]
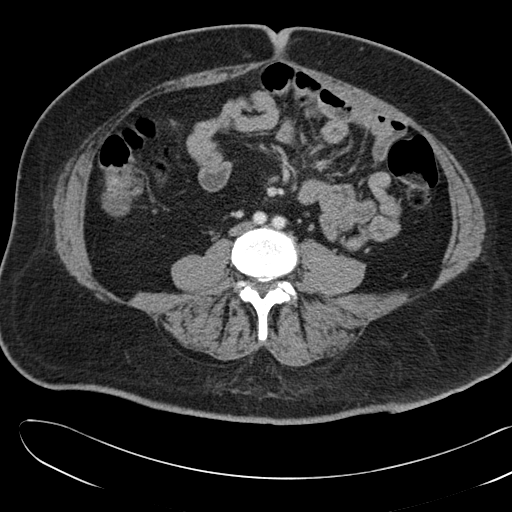
[im 93/179  soft-tissue]
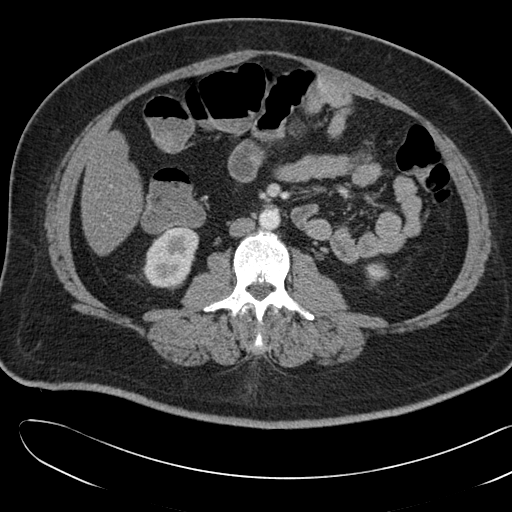
[im 101/179  soft-tissue]
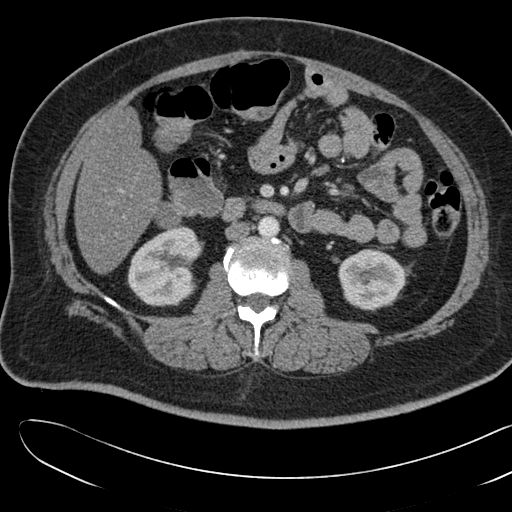
[im 117/179  soft-tissue]
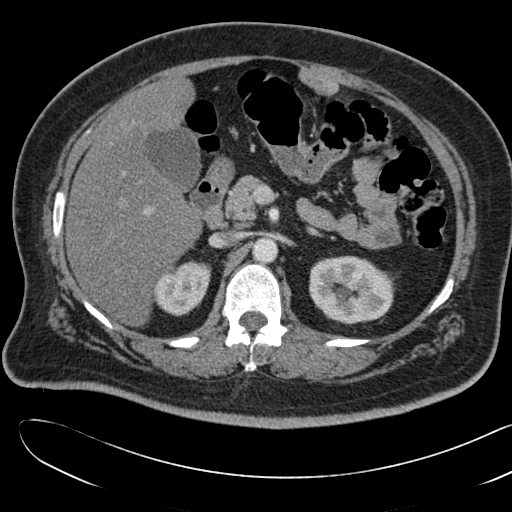
[im 117/179  bone]
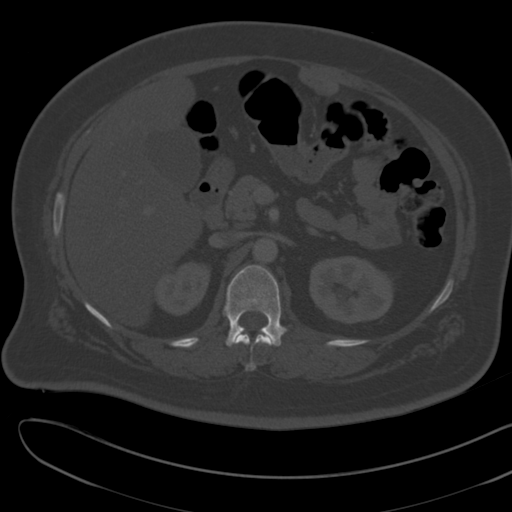
[im 132/179  soft-tissue]
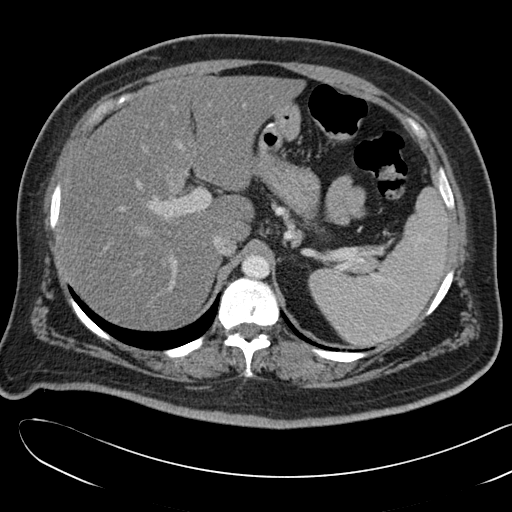
[im 140/179  soft-tissue]
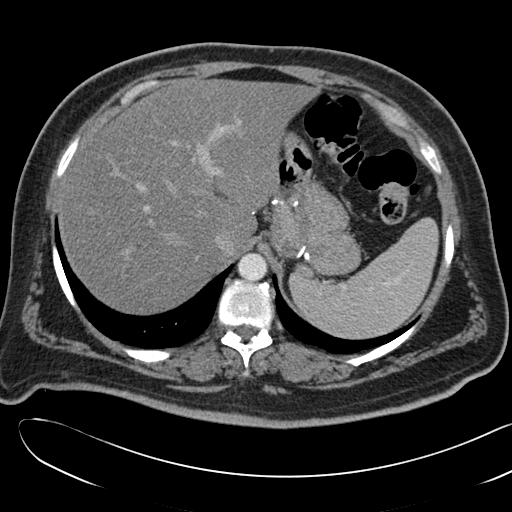
[im 148/179  lung]
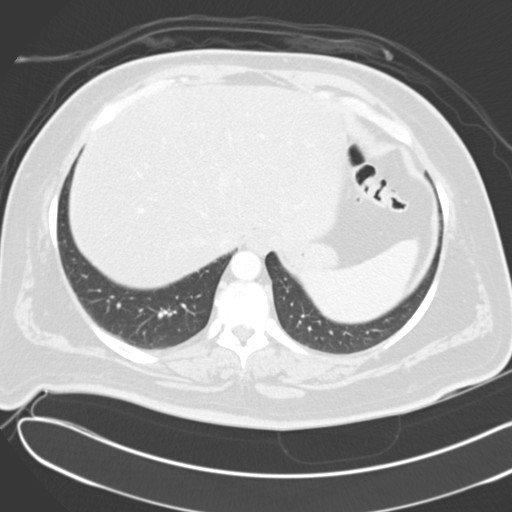
[im 155/179  soft-tissue]
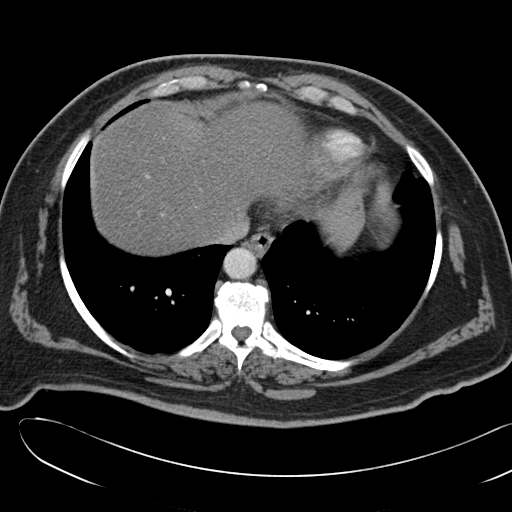
[im 155/179  lung]
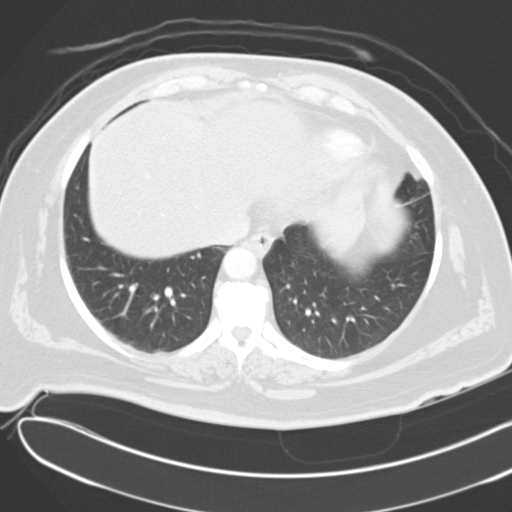
[im 163/179  lung]
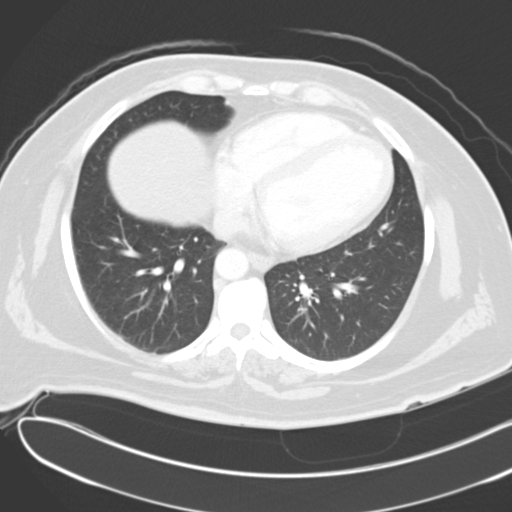
[im 171/179  soft-tissue]
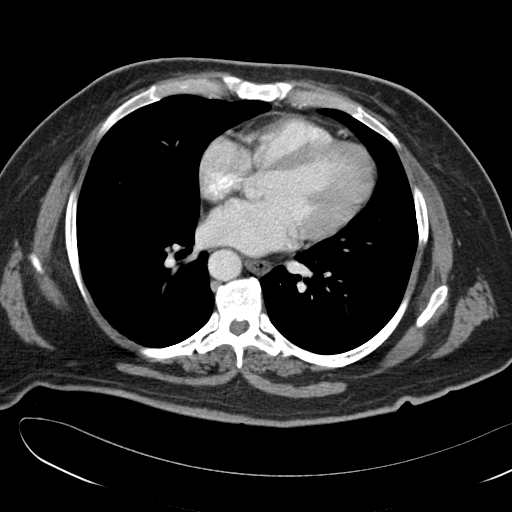
[im 171/179  lung]
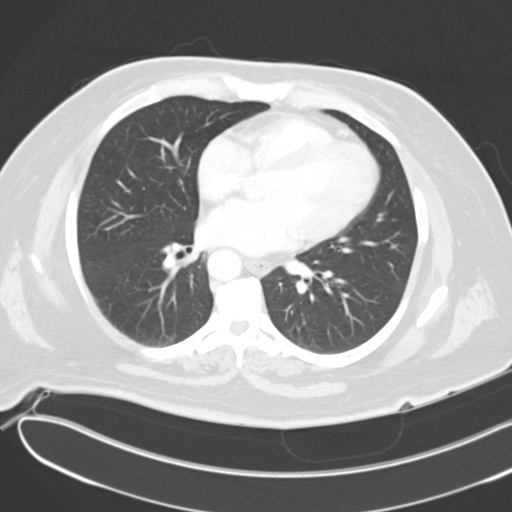

[15 of 32 positions shown; findings below may reference images not displayed]

PROCEDURE:     CT  - CT ABDOMEN / PELVIS  W  - January 31, 2012  [DATE]

RESULT:     CT of the abdomen and pelvis is performed utilizing 100 mL of
3sovue-WFF iodinated intravenous contrast. Images are reconstructed at
mm slice thickness in the axial plane. The patient has no previous exam for
comparison.

The lung bases appear clear. There is low-attenuation of the hepatic
parenchyma consistent with fatty infiltration. The spleen, pancreas,
gallbladder, adrenal glands and abdominal aorta appear normal. There appears
to be a nonobstructing 2 mm stone on image #78 in the anterior portion of
the lower pole of the left kidney. No additional stones are evident. The
abdominal aorta is normal in caliber. There is no free fluid or abnormal
fluid collection. The urinary bladder and uterus appear unremarkable. There
is a cystic area in the left adnexa, possibly representing a left ovarian
cyst measuring as much as 2.4 cm. Pelvic ultrasound could be performed for
correlation. Bony structures appear unremarkable.
IMPRESSION: 1. Left ovarian cyst appears to be present.
2. The appendix is not identified.

[REDACTED]

## 2014-01-13 IMAGING — US US PELV - US TRANSVAGINAL
2 series · 14 of 25 positions shown · non-contrast
Comparison: none

REASON FOR EXAM: LLQ pain
COMMENTS:

[Series 1: us pelv - us transvaginal · 0.33mm/px · 13 of 48 slices shown (1 of 2)]
[im 1/48]
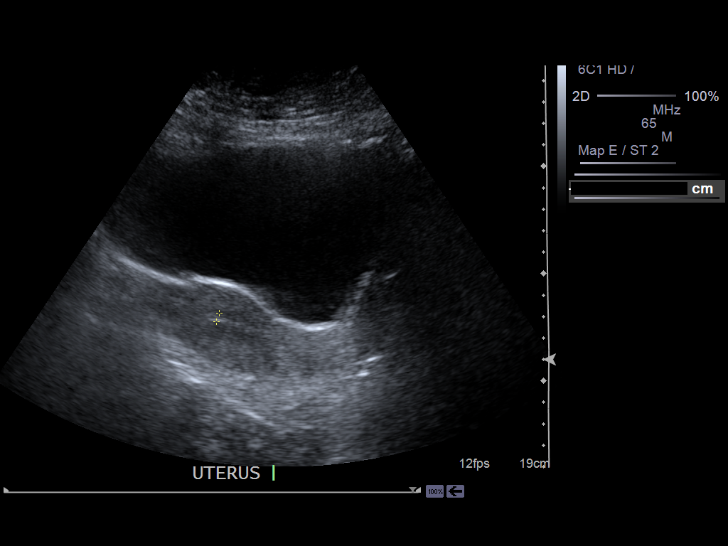
[im 5/48]
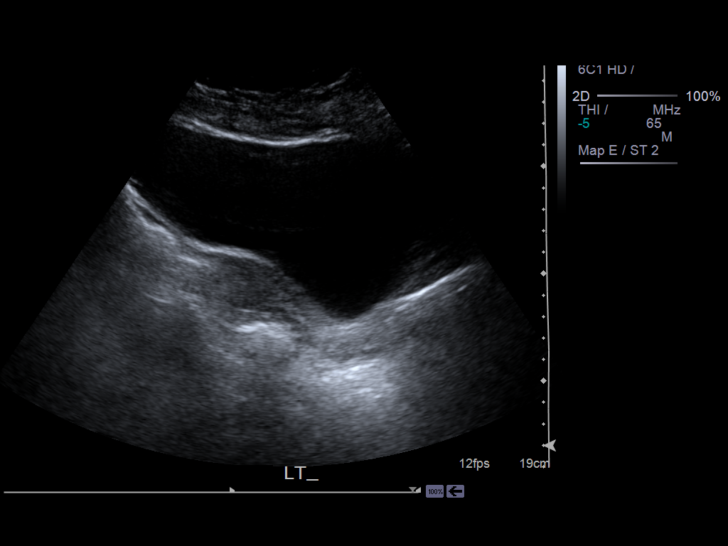
[im 9/48]
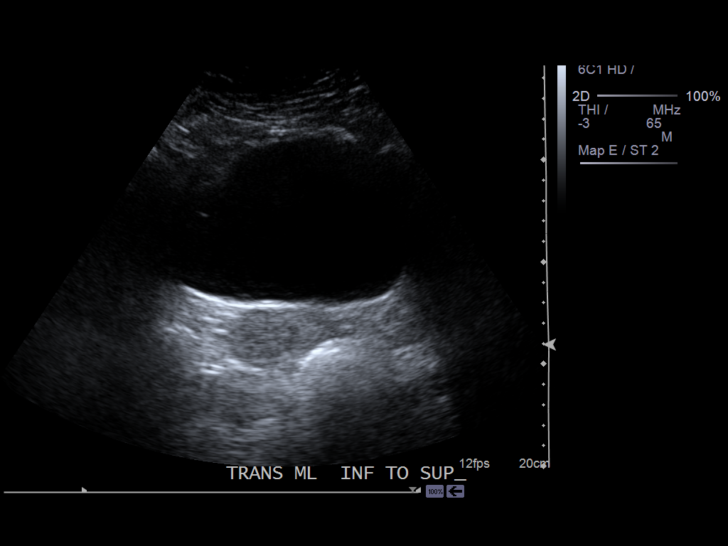
[im 13/48]
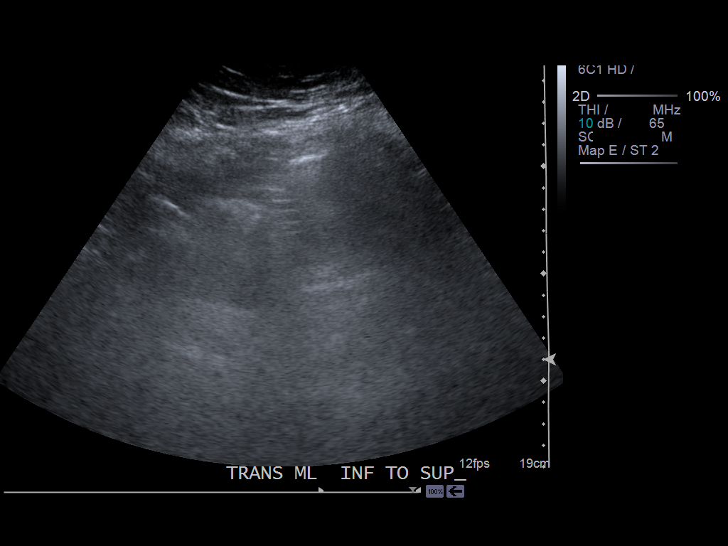
[im 17/48]
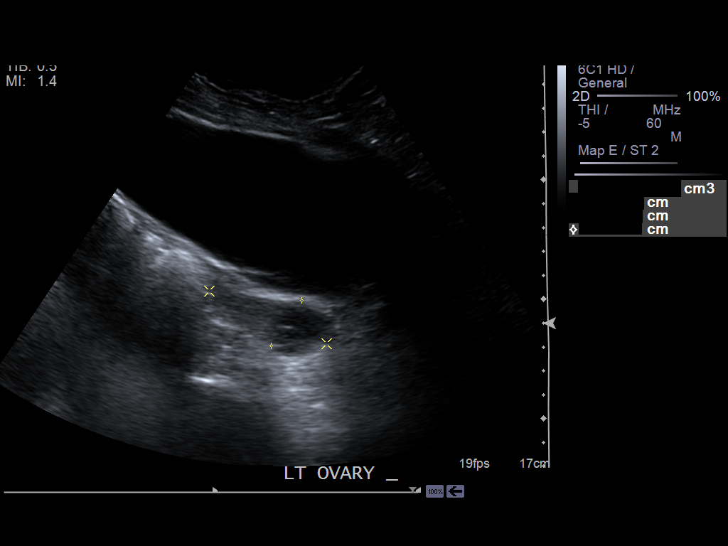
[im 19/48]
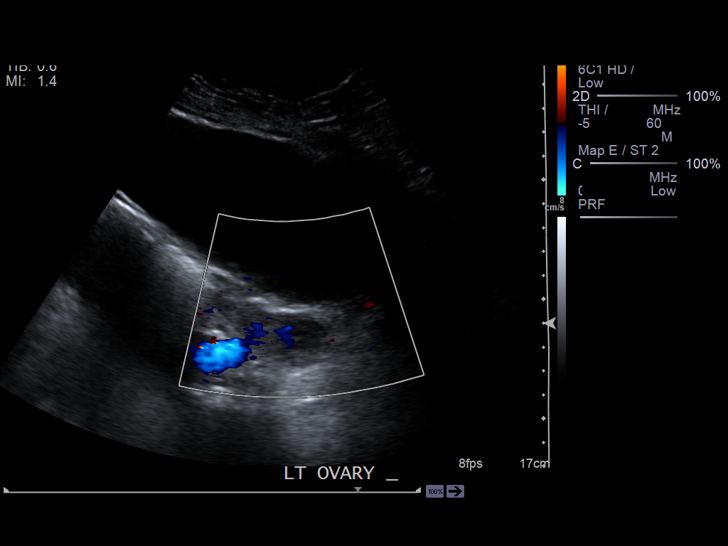
[im 23/48]
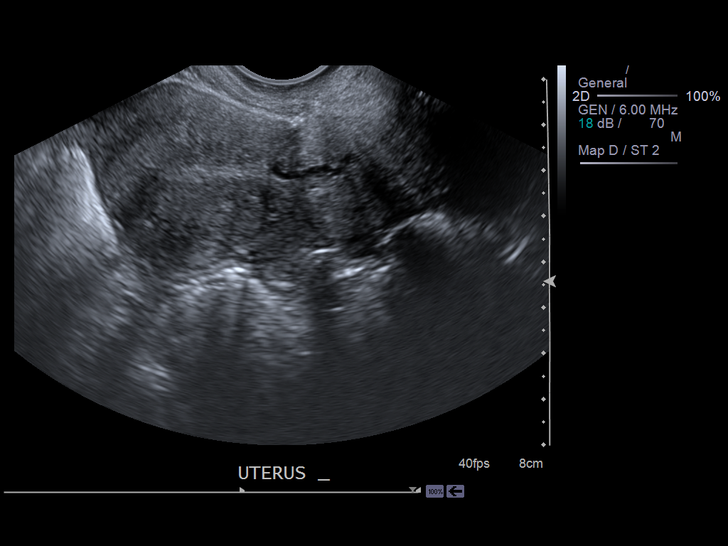
[im 27/48]
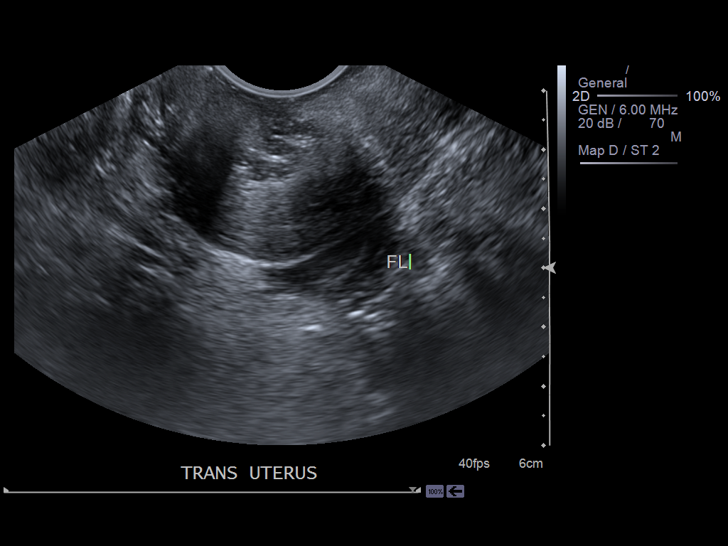
[im 31/48]
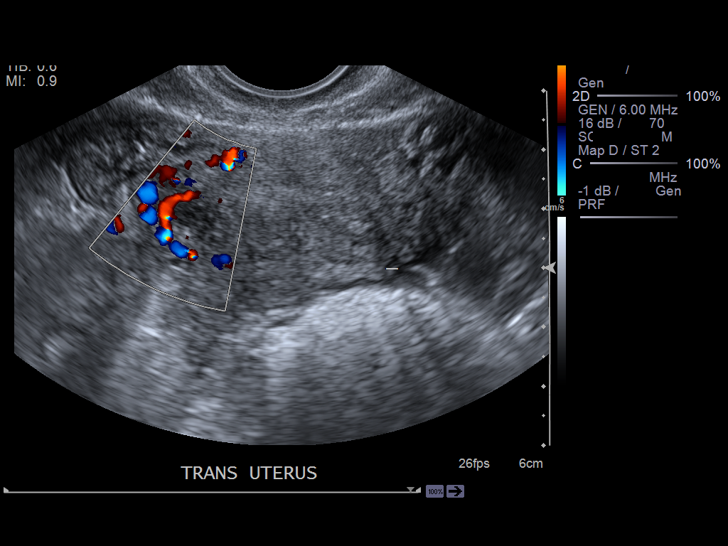
[im 33/48]
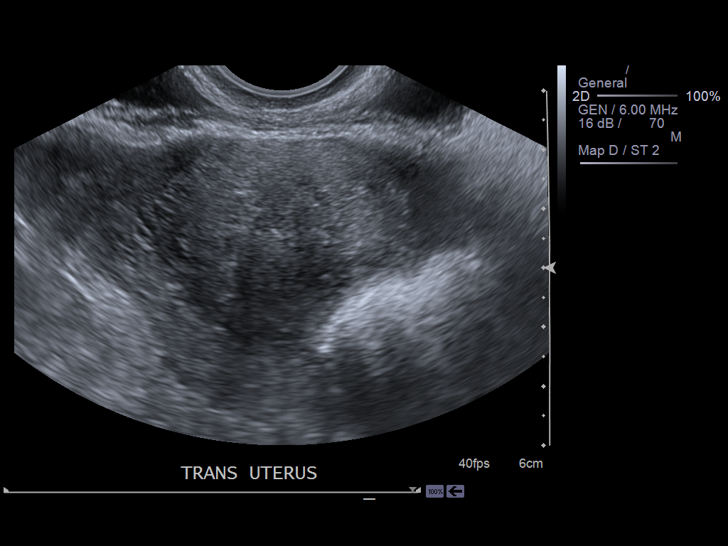
[im 37/48]
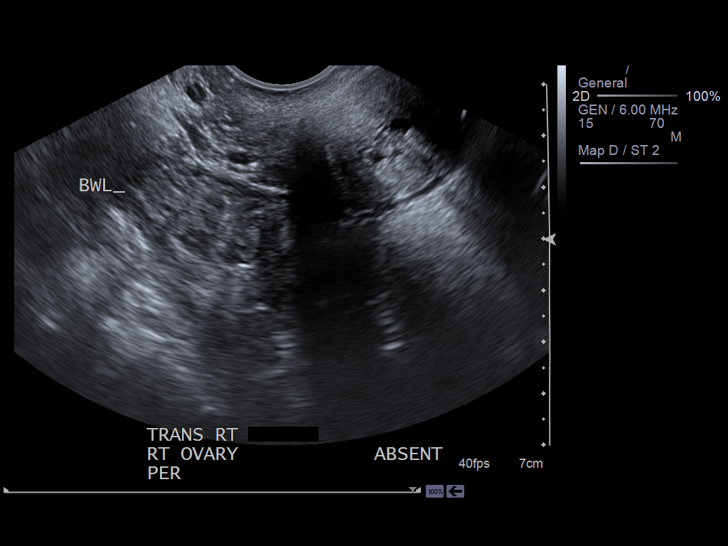
[im 41/48]
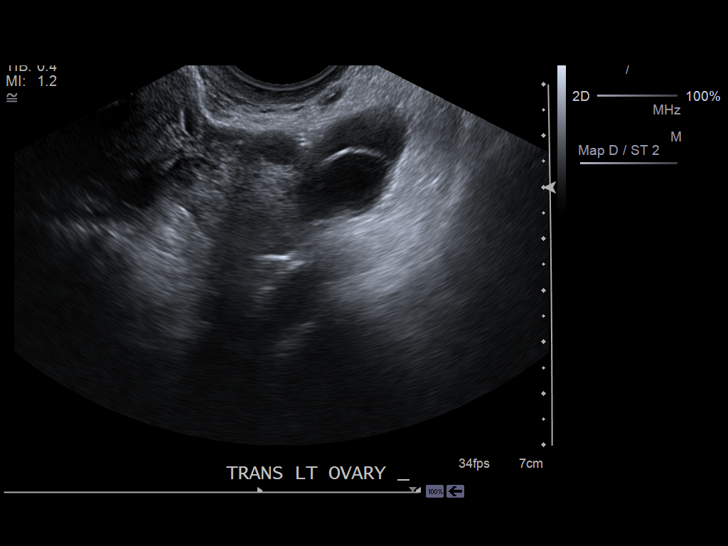
[im 45/48]
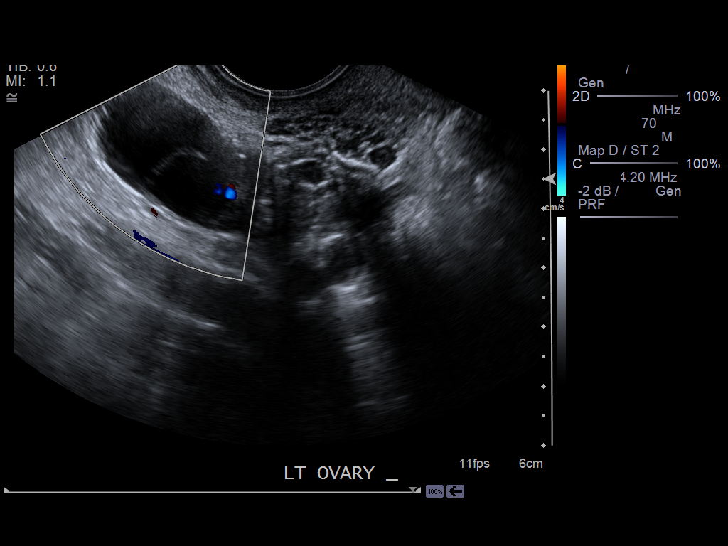

[Series 1001: us pelv - us transvaginal · 0.31mm/px · 1 of 2 slices shown (2 of 2)]
[im 1/2]
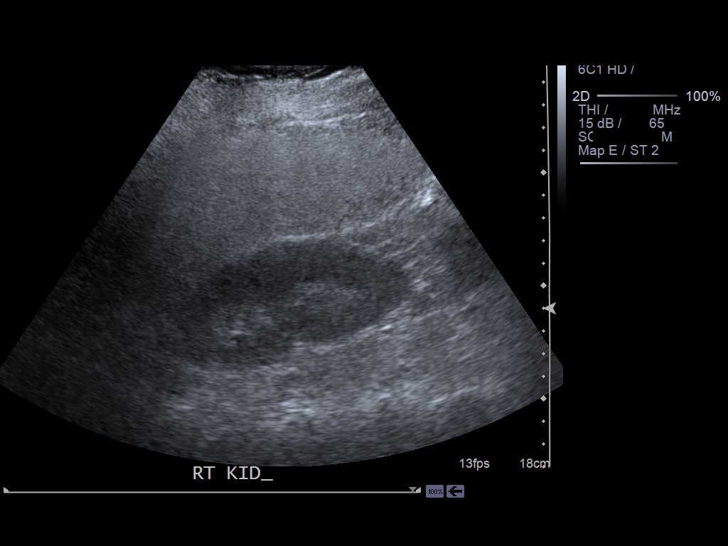

[14 of 25 positions shown; findings below may reference images not displayed]

PROCEDURE:     US  - US PELVIS EXAM W/TRANSVAGINAL  - January 31, 2012  [DATE]

RESULT:     Size, shape, and echotexture of the uterus is normal.
Endometrial stripe measures 4 mm. 1.2 cm fibroid in the right portion of the
uterus. Right ovary surgically absent. 3 cm septated left ovarian cyst is
present. No evidence of torsion. Small amount of free pelvic fluid.
IMPRESSION: 3 cm septated left ovarian cyst. Small uterine fibroid.
Small amount of free pelvic fluid.

## 2014-01-23 ENCOUNTER — Ambulatory Visit: Payer: Self-pay | Admitting: Family Medicine

## 2014-01-23 IMAGING — CR DG CHEST 2V
1 series · 2 of 2 positions shown · non-contrast
Comparison: none

REASON FOR EXAM: palpitations; LUQ pain
COMMENTS:

[Series 1: pa · 0.17mm/px · 2 of 2 slices shown]
[im 1/2]
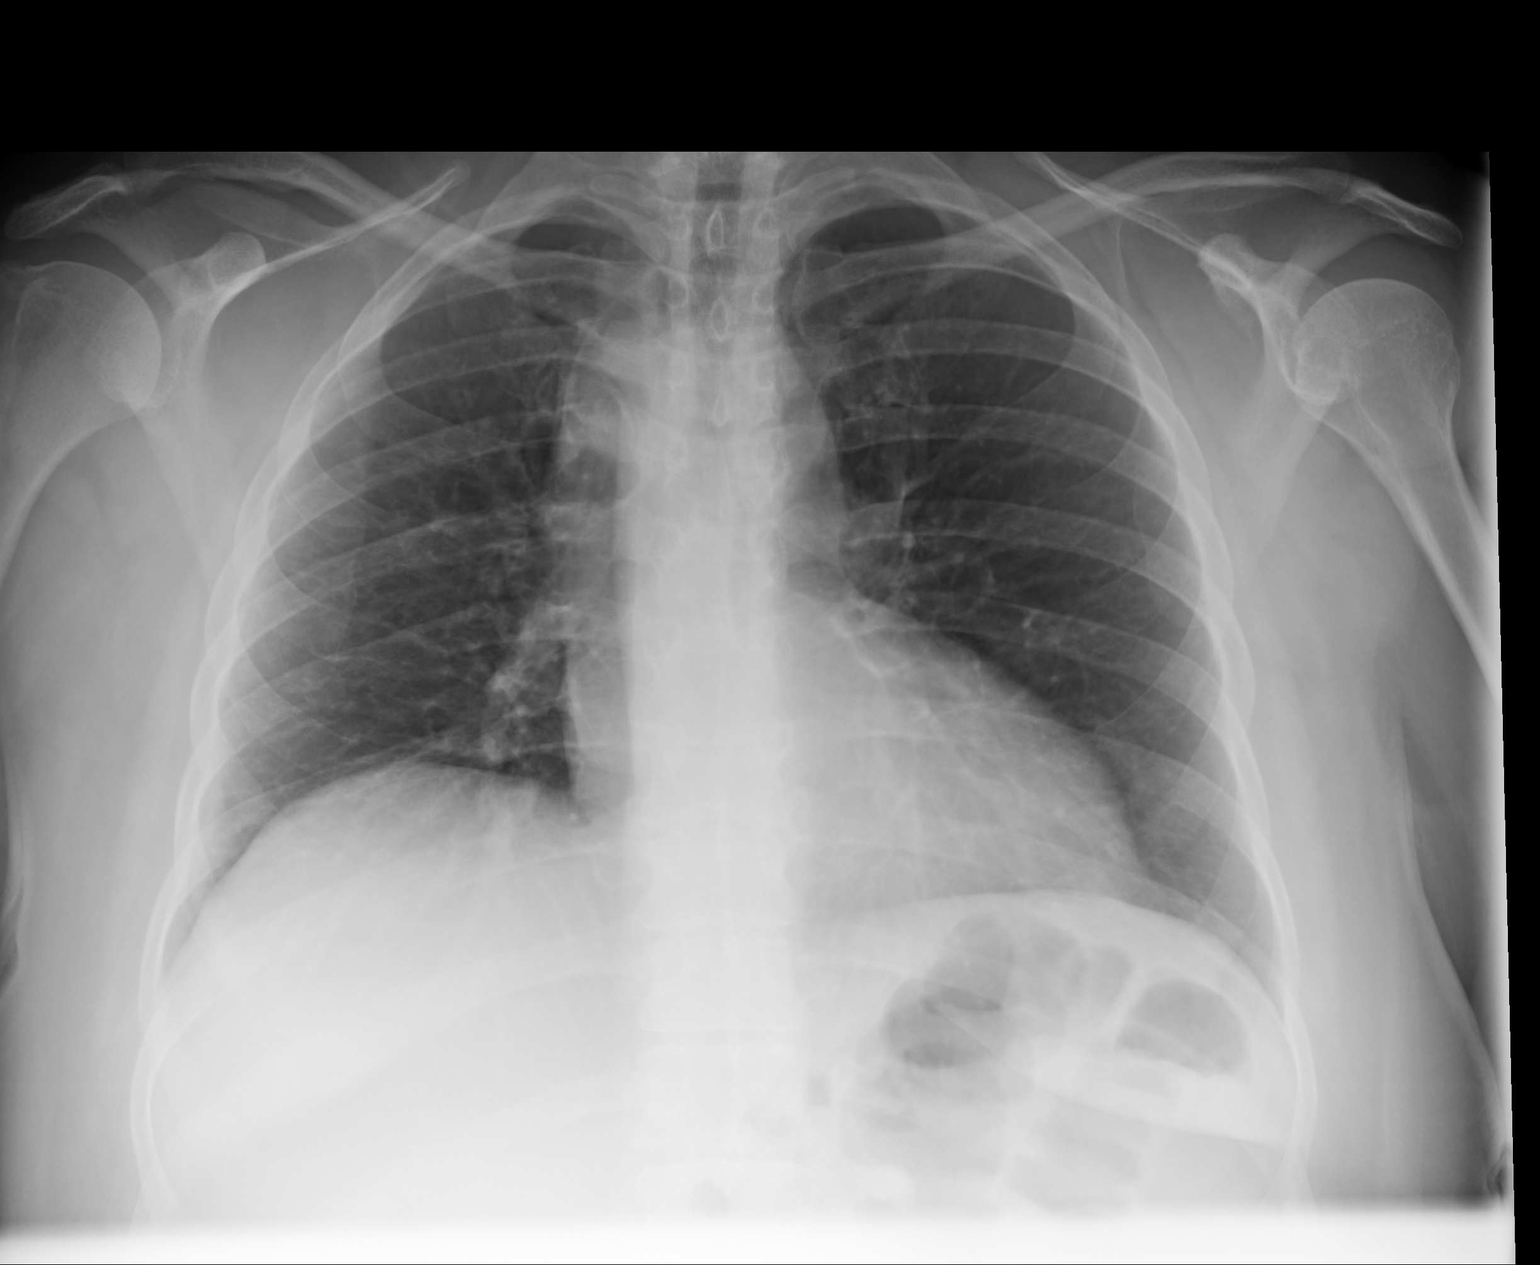
[im 2/2]
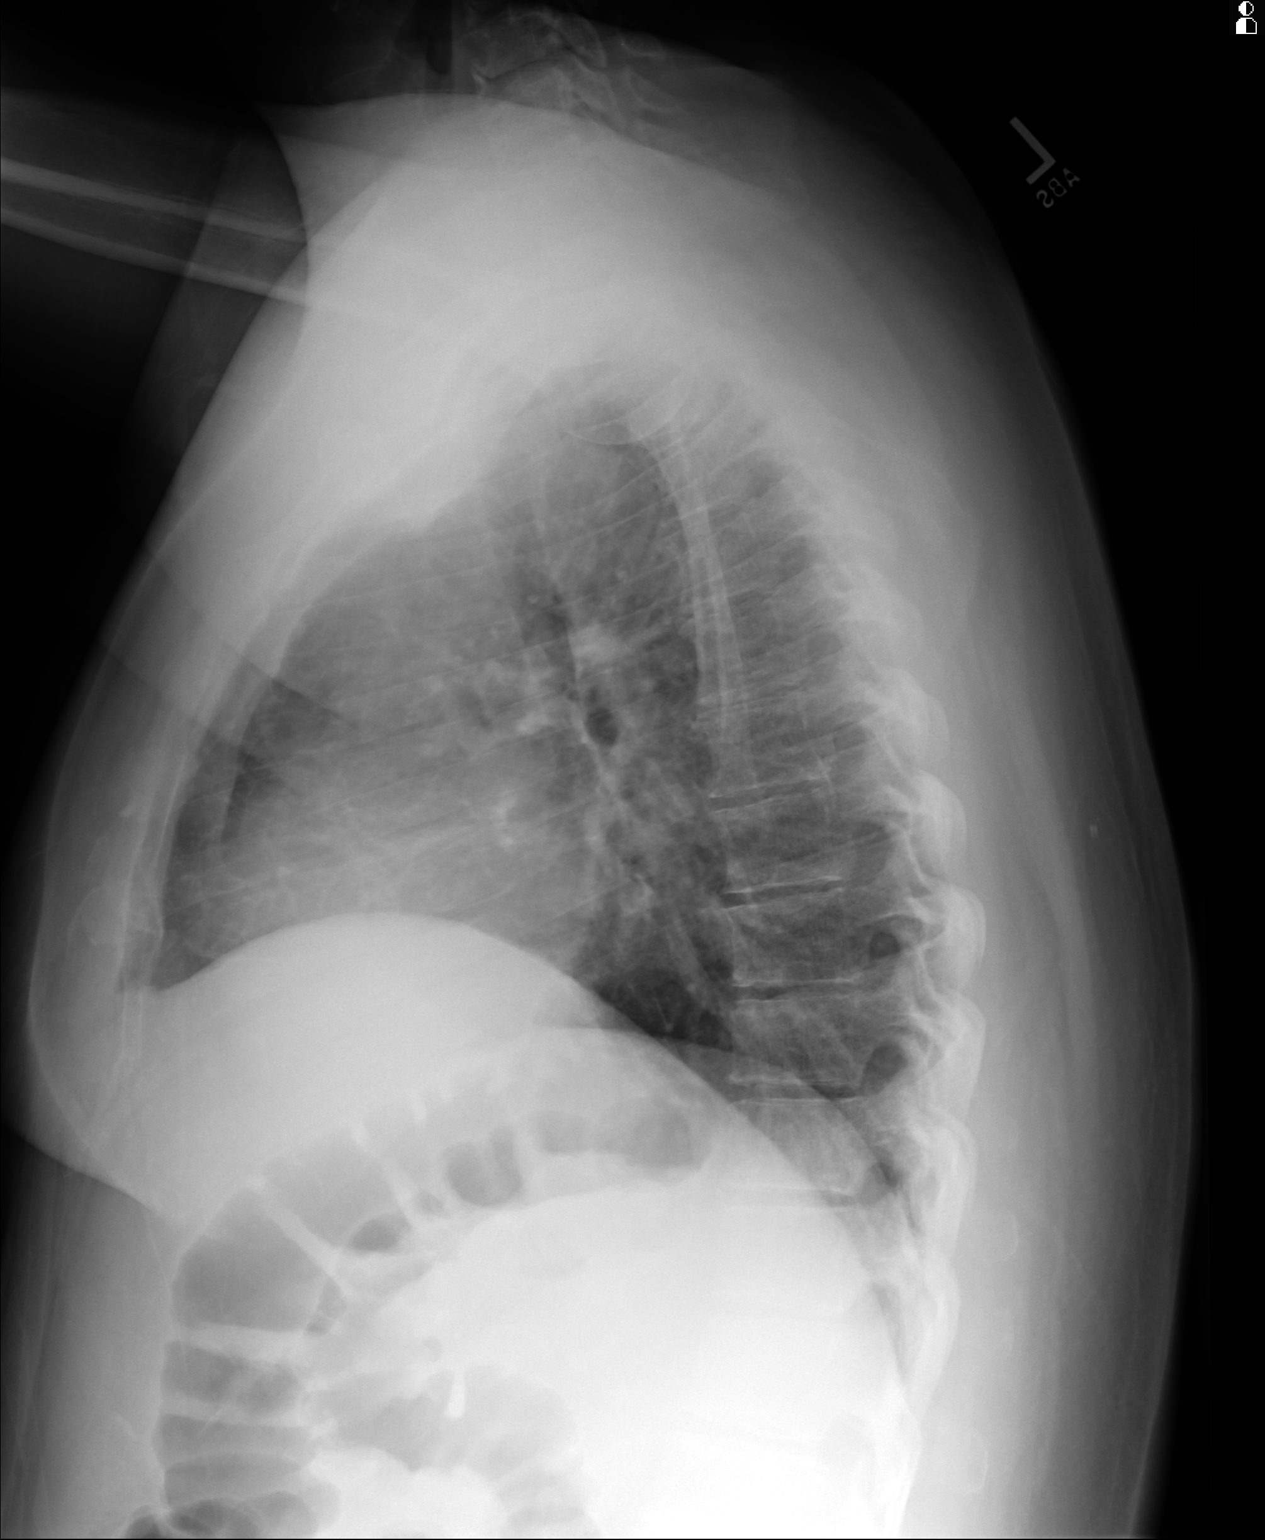

[2 of 2 positions shown; findings below may reference images not displayed]

PROCEDURE:     DXR - DXR CHEST PA (OR AP) AND LATERAL  - February 10, 2012  [DATE]

RESULT:     Comparison is made to the study December 17, 2010.

The lungs are reasonably well inflated. The right hemidiaphragm is higher
than the left which is stable. The cardiac silhouette is top normal in size.
The pulmonary vascularity is not engorged. Mediastinum is normal in width.
There is no pleural effusion. The bony thorax is normal in appearance.
IMPRESSION: There is mild pulmonary hypoinflation. I do not see
evidence of pneumonia nor CHF.

[REDACTED]

## 2014-01-28 IMAGING — CT CT STONE STUDY
1 of 2 series · 14 of 32 positions shown, 18 images · non-contrast
Comparison: none

REASON FOR EXAM: LLQ pain
COMMENTS:

[Series 2: 3mm soft tissue · axial · 0.75mm/px · z∈[-1038,-564]mm · 14 of 174 slices shown, 18 images]
[im 8/174  soft-tissue]
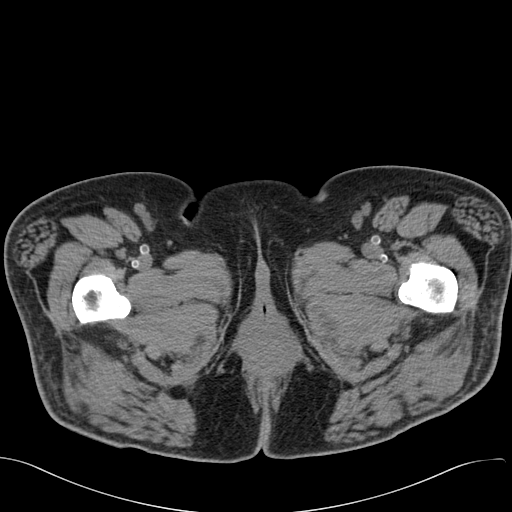
[im 8/174  bone]
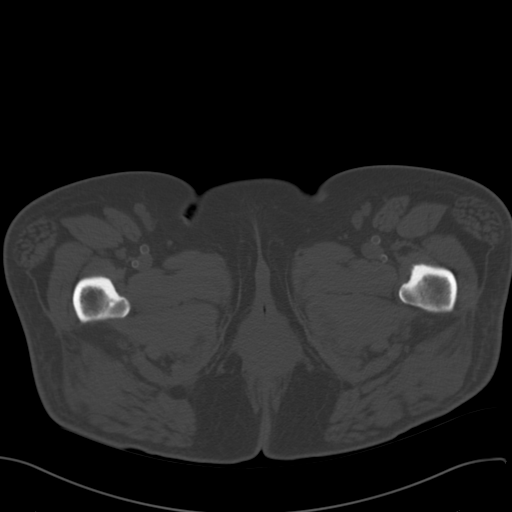
[im 22/174  soft-tissue]
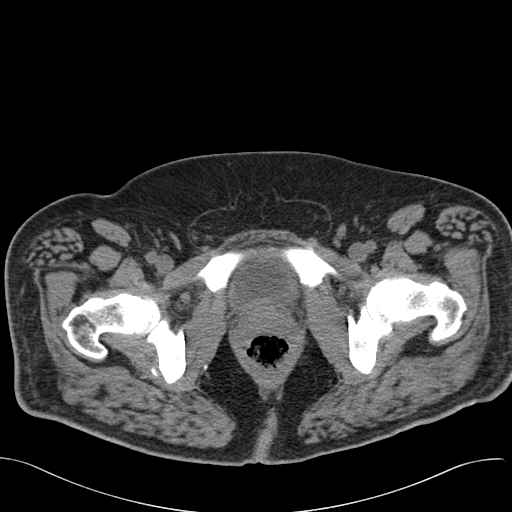
[im 37/174  soft-tissue]
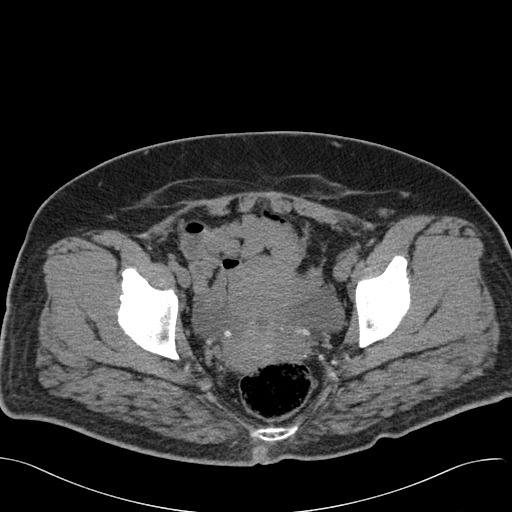
[im 51/174  soft-tissue]
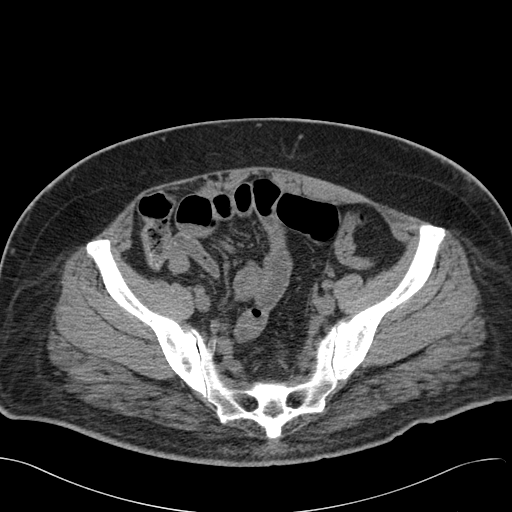
[im 65/174  soft-tissue]
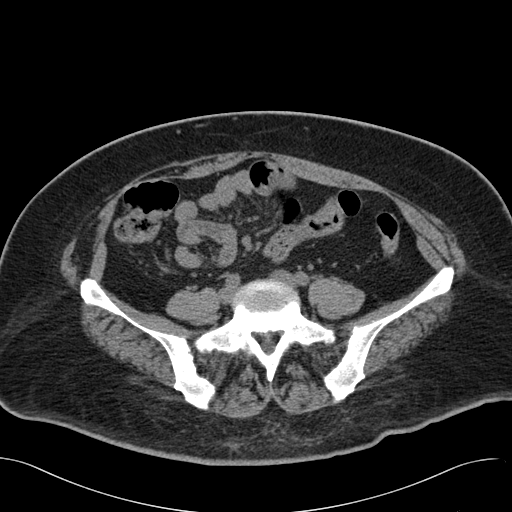
[im 80/174  soft-tissue]
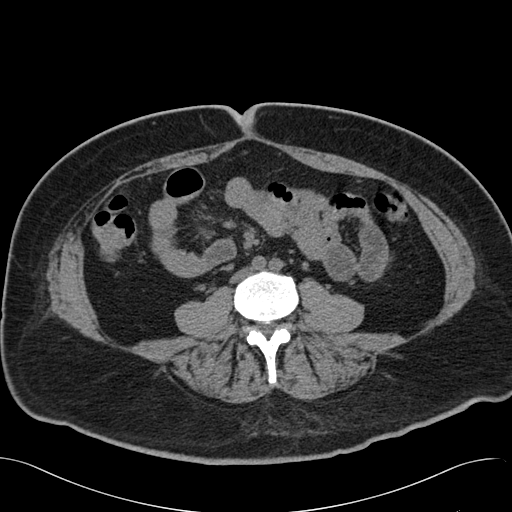
[im 94/174  soft-tissue]
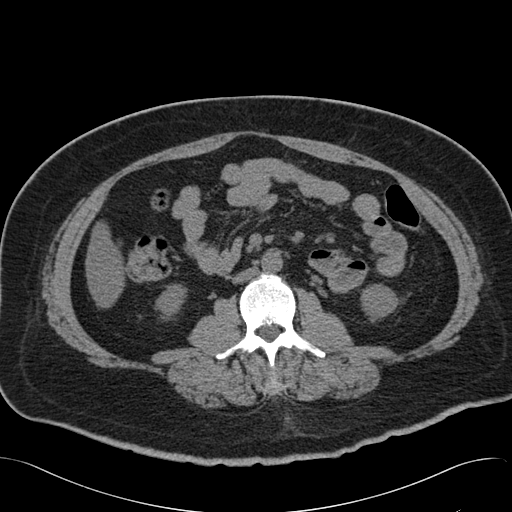
[im 109/174  soft-tissue]
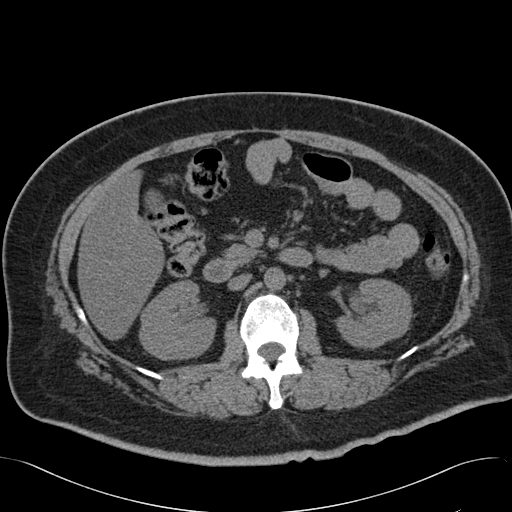
[im 123/174  soft-tissue]
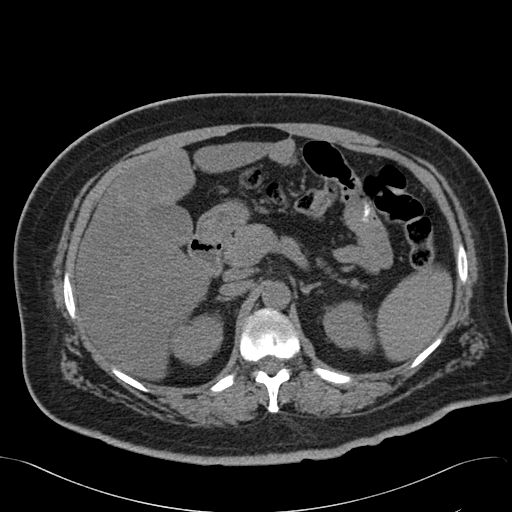
[im 123/174  bone]
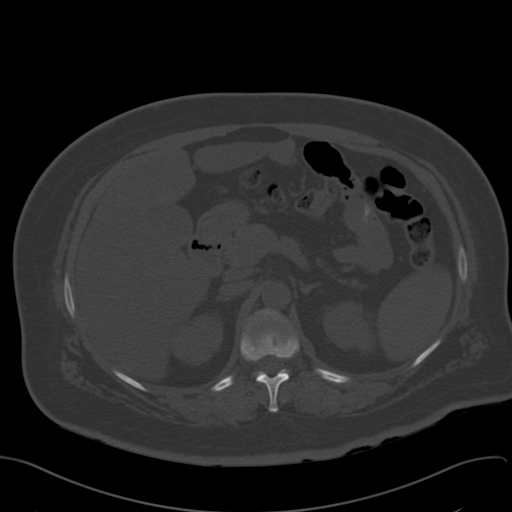
[im 137/174  soft-tissue]
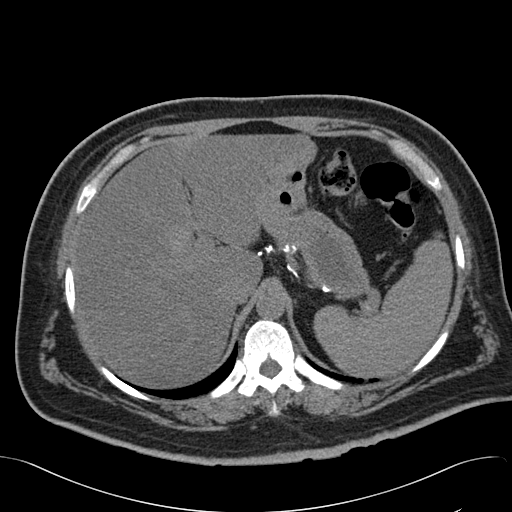
[im 145/174  lung]
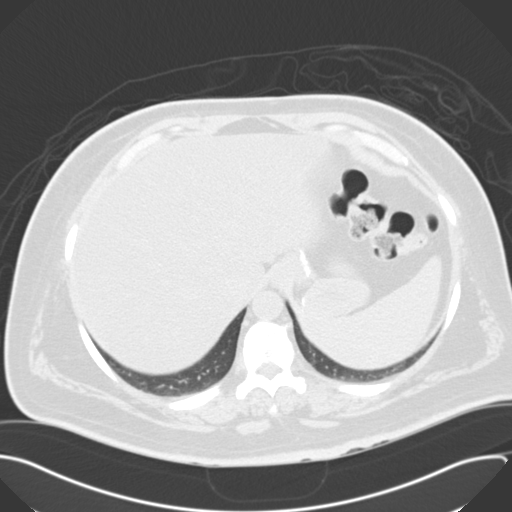
[im 152/174  soft-tissue]
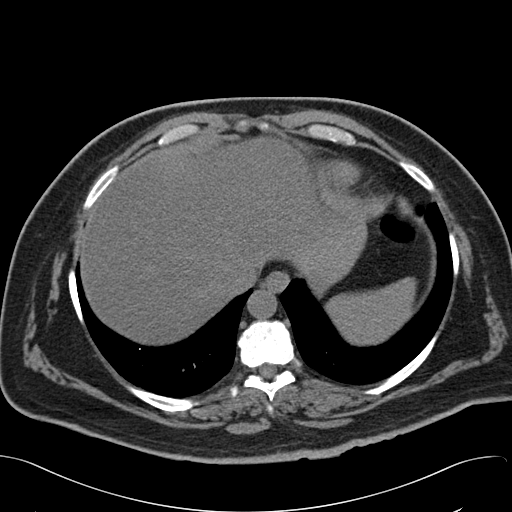
[im 152/174  lung]
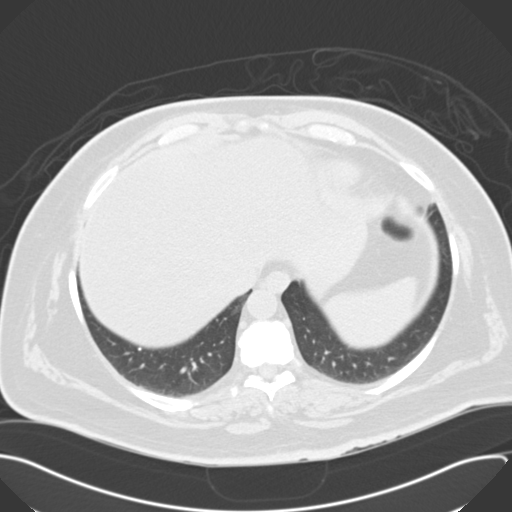
[im 159/174  lung]
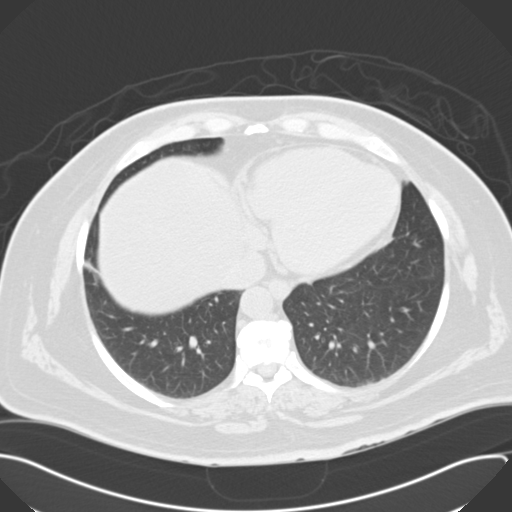
[im 166/174  soft-tissue]
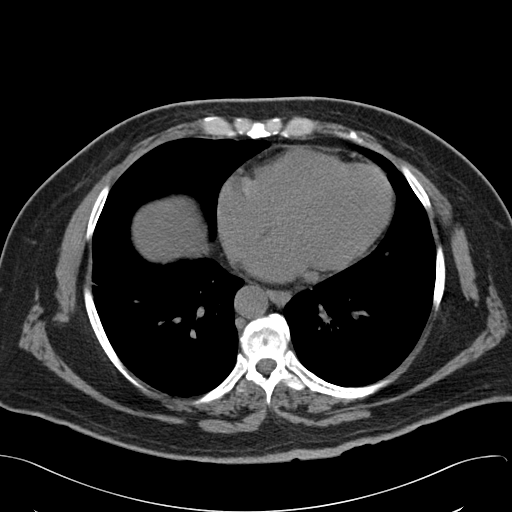
[im 166/174  lung]
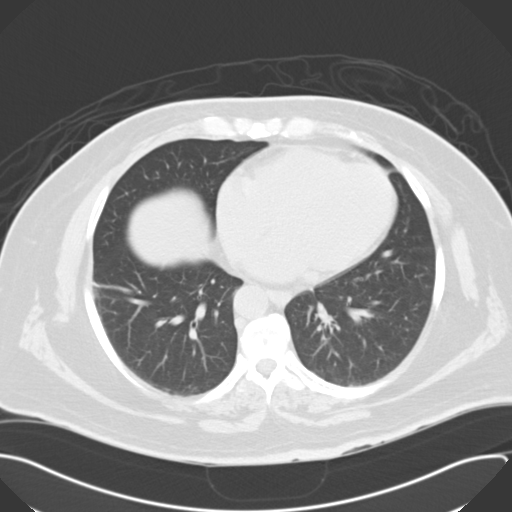

[14 of 32 positions shown; findings below may reference images not displayed]

PROCEDURE:     CT  - CT ABDOMEN /PELVIS WO (STONE)  - February 15, 2012  [DATE]

RESULT:     Axial noncontrast CT scanning was performed through the abdomen
and pelvis with reconstructions at 3 mm intervals and slice thicknesses.
Review of multiplanar reconstructed images was performed separately on the
VIA monitor. Comparison is made to the  study 31 January, 2012.

The kidneys exhibit normal contour. There is a trace of increased density in
the perinephric fat bilaterally which is stable. There is a nonobstructing
stone in a lower pole calyx on the left which is stable. No ureteral stones
are demonstrated. There are phleboliths within the pelvis. No ureterovesical
junction or bladder stone is demonstrated.

There are cystic appearing ovarian processes bilaterally with the largest on
the left. This left ovarian process measures approximately 3.3 cm AP x
cm transversely. The uterus is normal in appearance. A right adnexal cystic
process measures approximately 2.9 by 1.8 cm.

The unopacified loops of small and large bowel exhibit no evidence of ileus
nor of obstruction. There is no evidence of colitis or of diverticulitis.
The appendix is not discretely identified. The liver exhibits decreased
density consistent with fatty infiltration. The gallbladder is adequately
distended with no evidence of stones. There are surgical clips in the region
of the GE junction. The stomach is nondistended. The spleen is not enlarged.
There are no adrenal masses. The pancreas exhibits normal density with no
inflammatory changes. The caliber of the abdominal aorta is normal.

The lung bases are clear. The lumbar vertebral bodies are preserved in
height.
IMPRESSION: 1. There is no evidence of urinary tract obstruction currently. There is a
nonobstructing lower pole stone in the left kidney. No bladder abnormality
is demonstrated.
2. There are cystic adnexal processes bilaterally with the largest on the
left. There are internal densities which may indicate septations. Pelvic
ultrasound would be useful.
3. There is no evidence of colitis or diverticulitis nor other acute bowel
abnormality.
4. There is no acute hepatobiliary abnormality. There are fatty infiltrative
changes of the liver.

[REDACTED]

## 2014-02-02 HISTORY — PX: MELANOMA EXCISION: SHX5266

## 2014-03-06 ENCOUNTER — Encounter: Payer: Self-pay | Admitting: Cardiovascular Disease

## 2014-03-06 ENCOUNTER — Ambulatory Visit (INDEPENDENT_AMBULATORY_CARE_PROVIDER_SITE_OTHER): Payer: BC Managed Care – PPO | Admitting: Cardiovascular Disease

## 2014-03-06 VITALS — BP 110/82 | HR 89 | Ht 63.0 in | Wt 183.5 lb

## 2014-03-06 DIAGNOSIS — Q2548 Anomalous origin of subclavian artery: Secondary | ICD-10-CM

## 2014-03-06 DIAGNOSIS — I10 Essential (primary) hypertension: Secondary | ICD-10-CM

## 2014-03-06 DIAGNOSIS — IMO0002 Reserved for concepts with insufficient information to code with codable children: Secondary | ICD-10-CM

## 2014-03-06 DIAGNOSIS — Q254 Other congenital malformations of aorta: Secondary | ICD-10-CM

## 2014-03-06 DIAGNOSIS — E785 Hyperlipidemia, unspecified: Secondary | ICD-10-CM

## 2014-03-06 DIAGNOSIS — I639 Cerebral infarction, unspecified: Secondary | ICD-10-CM

## 2014-03-06 DIAGNOSIS — E1165 Type 2 diabetes mellitus with hyperglycemia: Secondary | ICD-10-CM

## 2014-03-06 DIAGNOSIS — E118 Type 2 diabetes mellitus with unspecified complications: Secondary | ICD-10-CM

## 2014-03-06 NOTE — Assessment & Plan Note (Signed)
Previous stroke, will continue on aspirin and Plavix

## 2014-03-06 NOTE — Assessment & Plan Note (Signed)
She had ligation of her left subclavian artery, bypass of left carotid to left subclavian, still with residual anomalous vessel requiring further ligation per the patient. She will hold her Plavix for 5-7 days prior to the procedure. It was felt the left subclavian was causing her dysphagia leading to her bypass surgery

## 2014-03-06 NOTE — Assessment & Plan Note (Signed)
Blood pressure is well controlled on today's visit. No changes made to the medications. 

## 2014-03-06 NOTE — Assessment & Plan Note (Signed)
Cholesterol is at goal on the current lipid regimen. No changes to the medications were made.  

## 2014-03-06 NOTE — Progress Notes (Signed)
Patient ID: [name], female    DOB: 25-Jun-1970, 44 y.o.   MRN: [id number]  HPI Comments: 44 year old woman with a long hx of smoking, numerous prior strokes,  history of chest pain dating back to 2005 at that time with cardiac catheterization on August 09, 2003 found to have mild plaque in the LAD, but there was no obstructive disease noted.   MUGA, which showed an ejection fraction of 51%.   Transesophageal echocardiogram in the setting of a CVA in May 2010  showed normal LV function, s/p gastric bypass surgery, Who presents for routine followup of her hypertension. She is status post left carotid to subclavian artery bypass with subclavian artery ligation, done at [facility] by vascular surgery  CT in October 2015 showing Right aortic arch with a large retro- esophageal vascular segment, persistent dorsal aortic segment) giving off aberrant left subclavian artery. This large caliber retroaortic segment measuring 18 mm compared to the ascending aorta which measures 22 mm. This large retroesophageal aortic segment along with the partially calcified atretic ductus ligament between the left aberrant subclavian artery and the left pulmonary artery creates an ALMOSTcomplete vascular ring. This is likelythe cause ofpatient's dysphagia [place].  In follow-up today, she reports that she is doing relatively well. She continues to have labile sugars. In November 2015, she was sent to the hospital with mental status changes, hypoglycemia with sugar of 30, low blood pressure, hypo-thermic. Unable to exclude SIRS.  She recovered well and now travels with sugar tablets in her pockets. She's been working with Dr. Caryn Section for regulation of her sugars. She is scheduled to have a hemoglobin A1c in the near future.  Reports that her blood pressure has been relatively stable. He was running high and she has placed herself back on Coreg 6.25 mill grams twice a day, now up of 5 mill grams daily, HCTZ 25 mg  daily, clonidine 0.2 mg at nighttime She's also taking her statin Lipitor 40 mg daily  Previously was taking midodrine or Florinef for hypotension following her stroke.  EKG on today's visit shows normal sinus rhythm with rate 89 bpm, nonspecific ST abnormality  She previously presented to the hospital December 18, 2010 with chest pain.   stress test in the hospital and showed no ischemia. Cardiac enzymes were negative. No EKG changes noted.   admission to the hospital 11/01/2012 with discharge 11/02/2012 with chest pain. She ruled out with negative cardiac enzymes. Blood pressures were in the [id number] systolic range. Discharge blood pressure medications included amlodipine 10 mg daily, benazepril 40 mg daily, clonidine 0.2 mg., Coreg 3.125 mg twice a day  stroke and admission to the hospital at the end of December 2014. She had acute onset left sided weakness including arm and leg. She reports that review of MRI showed acute stroke, as well as previous strokes. She was seen by neurology and aspirin changed to Plavix 75 mg daily. Hemoglobin A1c 8.5. Notes indicate short run of SVT while she was in the hospital and on the monitor.  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  Echocardiogram 01/29/2013 suggest ejection fraction 40-45% CT scan of the head showing small vessel disease MRI showing acute infarct right parietal white matter, extensive chronic microvascular ischemic changes  Echocardiogram April 2014 showed ejection fraction 50-55%, otherwise normal study Stress test 11/02/2012 showing no ischemia,   Allergies  Allergen Reactions  . Tramadol     MOUTH  BURNING AND CONFUSION    Outpatient Encounter Prescriptions as of 03/06/2014  Medication Sig  . aspirin EC 325 MG tablet Take 325 mg by mouth daily.   Marland Kitchen atorvastatin (LIPITOR) 40 MG tablet Take 40 mg by mouth daily.  Marland Kitchen buPROPion (WELLBUTRIN XL)  300 MG 24 hr tablet Take 300 mg by mouth daily.  . Canagliflozin 300 MG TABS Take 300 mg by mouth daily.  . carvedilol (COREG) 6.25 MG tablet Take 1 tablet (6.25 mg total) by mouth 2 (two) times daily with a meal.  . cloNIDine (CATAPRES) 0.2 MG tablet Take 0.2 mg by mouth daily.   . clopidogrel (PLAVIX) 75 MG tablet Take 75 mg by mouth daily with breakfast.  . cyclobenzaprine (FLEXERIL) 10 MG tablet Take 10 mg by mouth 3 (three) times daily as needed for muscle spasms.  Marland Kitchen escitalopram (LEXAPRO) 20 MG tablet Take 20 mg by mouth daily.  Marland Kitchen gabapentin (NEURONTIN) 300 MG capsule Take 1 capsule (300 mg total) by mouth 3 (three) times daily.  Marland Kitchen glucose blood (ONE TOUCH ULTRA TEST) test strip 1 each by Other route. Use as instructed  Up to 6 times daily   . hydrochlorothiazide (HYDRODIURIL) 25 MG tablet Take 25 mg by mouth daily.   . insulin aspart (NOVOLOG) 100 UNIT/ML injection Inject 4-15 Units into the skin 3 (three) times daily as needed for high blood sugar. Takes anywhere from 4-15 units as directed per sliding scale  . insulin detemir (LEVEMIR) 100 UNIT/ML injection Inject 25 Units into the skin as needed.   Marland Kitchen levothyroxine (SYNTHROID, LEVOTHROID) 25 MCG tablet Take 25 mcg by mouth daily before breakfast.   . metFORMIN (GLUCOPHAGE) 1000 MG tablet Take 1,000 mg by mouth 2 (two) times daily with a meal.   . Multiple Vitamin (MULTIVITAMIN) tablet Take 1 tablet by mouth daily.   . pantoprazole (PROTONIX) 40 MG tablet Take 80 mg by mouth daily.  . promethazine (PHENERGAN) 25 MG tablet Take 25 mg by mouth every 6 (six) hours as needed. For nausea  . temazepam (RESTORIL) 15 MG capsule Take 1 capsule (15 mg total) by mouth at bedtime as needed.  . [DISCONTINUED] carvedilol (COREG) 6.25 MG tablet Take 3.125 mg by mouth 2 (two) times daily with a meal.  . enalapril (VASOTEC) 5 MG tablet Take 1 tablet (5 mg total) by mouth daily.    Past Medical History  Diagnosis Date  . Hypertension   .  Hyperlipidemia   . Obesity   . Depression   . Anxiety   . IDDM (insulin dependent diabetes mellitus)   . Colon polyp   . GERD (gastroesophageal reflux disease)   . History of left heart catheterization (LHC)     a. cath 08/09/2003: mild LAD plaque, no obs dzs noted; b. 12/18/10 stress test w/o ischemia.   . Worsening headaches   . Slurred speech   . Depression   . CVA (cerebral vascular accident)     a. 06/2008-TEE nl LV fxn; b. 01/2013 - notes indicate short run of SVT at that time  . Multiple sclerosis 2015  . Syncope   . Chronic systolic CHF (congestive heart failure)     a. echo 01/29/2013 EF 40-45%, basal anteroseptal & mid anteroseptal segments abnl; b echo 05/2012 EF 50-55%, mildly dilated LA, nl RVSP  . Orthostatic hypotension   . Aortic arch aneurysm   . Cardiomyopathy   . Acute on chronic renal failure   . MS (mitral stenosis)     Past Surgical  History  Procedure Laterality Date  . Appendectomy    . Right oophorectomy    . Tubal ligation      R tube removed still w/ Left  . Dilation and curettage of uterus    . Gastric bypass  09/2009  . S/p gastric bypass      2011 [facility]  . Cardiac catheterization      [facility]     Social History  reports that she has never smoked. She has never used smokeless tobacco. She reports that she does not drink alcohol or use illicit drugs.  Family History family history includes Colon cancer in her father; Diabetes in her father; Heart disease in her father; Hypertension in her father and mother; Kidney disease in her father; Stroke in her father.     Review of Systems  Constitutional: Negative.   Respiratory: Negative.   Cardiovascular: Negative.   Gastrointestinal: Negative.   Musculoskeletal: Negative.   Neurological: Negative.        Left arm, left leg weakness  Hematological: Negative.   All other systems reviewed and are negative.   BP 110/82 mmHg  Pulse 89  Ht 5\' 3"  (1.6 m)  Wt 183 lb 8 oz (83.235 kg)  BMI  32.51 kg/m2  Physical Exam  Constitutional: She is oriented to person, place, and time. She appears well-developed and well-nourished.  HENT:  Head: Normocephalic.  Nose: Nose normal.  Mouth/Throat: Oropharynx is clear and moist.  Eyes: Conjunctivae are normal. Pupils are equal, round, and reactive to light.  Neck: Normal range of motion. Neck supple. No JVD present.  Cardiovascular: Normal rate, regular rhythm, S1 normal, S2 normal, normal heart sounds and intact distal pulses.  Exam reveals no gallop and no friction rub.   No murmur heard. Pulmonary/Chest: Effort normal and breath sounds normal. No respiratory distress. She has no wheezes. She has no rales. She exhibits no tenderness.  Abdominal: Soft. Bowel sounds are normal. She exhibits no distension. There is no tenderness.  Musculoskeletal: Normal range of motion. She exhibits no edema or tenderness.  Lymphadenopathy:    She has no cervical adenopathy.  Neurological: She is alert and oriented to person, place, and time. Coordination normal.  Skin: Skin is warm and dry. No rash noted. No erythema.  Psychiatric: She has a normal mood and affect. Her behavior is normal. Judgment and thought content normal.    Assessment and Plan  Nursing note and vitals reviewed.

## 2014-03-06 NOTE — Assessment & Plan Note (Signed)
Managed by Dr. Caryn Section. She reports sugars have been doing better, was very low in November 2015 requiring hospitalization

## 2014-03-06 NOTE — Patient Instructions (Addendum)
You are doing well. No medication changes were made.  If you dizziness with standing, Cut the clonidine in 1/2 at night (or 1/2 pill twice a day instead of 0.2 mg at night)  Please call us if you have new issues that need to be addressed before your next appt.  Your physician wants you to follow-up in: 6 months.  You will receive a reminder letter in the mail two months in advance. If you don't receive a letter, please call our office to schedule the follow-up appointment.

## 2014-03-20 LAB — HEMOGLOBIN A1C: HEMOGLOBIN A1C: 5.7 % (ref 4.0–6.0)

## 2014-03-26 ENCOUNTER — Emergency Department: Payer: Self-pay | Admitting: Emergency Medicine

## 2014-03-26 ENCOUNTER — Telehealth: Payer: Self-pay

## 2014-03-26 NOTE — Telephone Encounter (Signed)
Pt states she is on her way to [facility] ED for CP and SOB, states she had a procedure on Friday at [facility], insertion of a "mesh", she's not sure of the name of it.

## 2014-03-26 NOTE — Telephone Encounter (Signed)
Pt husband called, states pt is at ED with Chest pain and ED  This encounter was created in error - please disregard.

## 2014-05-07 IMAGING — MR MRI HEAD WITHOUT CONTRAST
7 of 8 series · 34 of 48 positions shown · non-contrast
Comparison: None

REASON FOR EXAM: headaches blurred vision
COMMENTS:

PROCEDURE:     MMR - MMR BRAIN WO CONTRAST  - May 24, 2012 [DATE]
RESULT:     History: Headaches
TECHNIQUE: Multiplanar, multisequence MRI of the brain was obtained without
IV contrast.

[Series 2: T1 · axial · 8.0mm · 0.55mm/px · z∈[+0,+91]mm · 4 of 25 slices shown (1 of 2)]
[im 1/25]
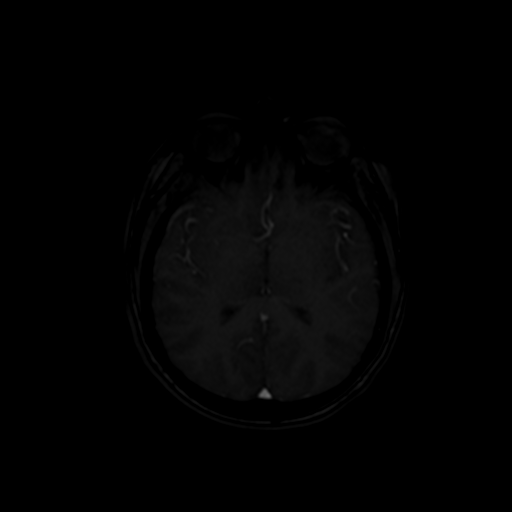
[im 9/25]
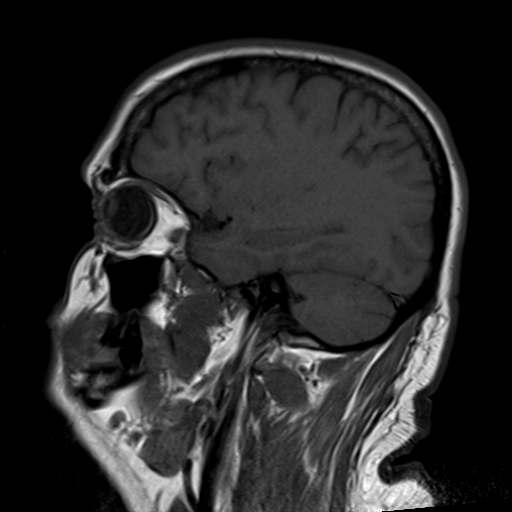
[im 17/25]
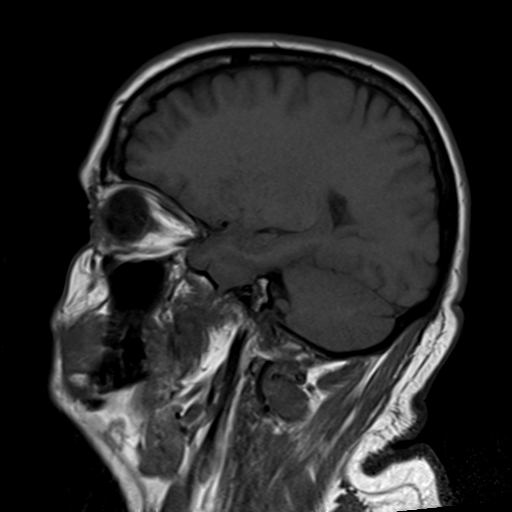
[im 25/25]
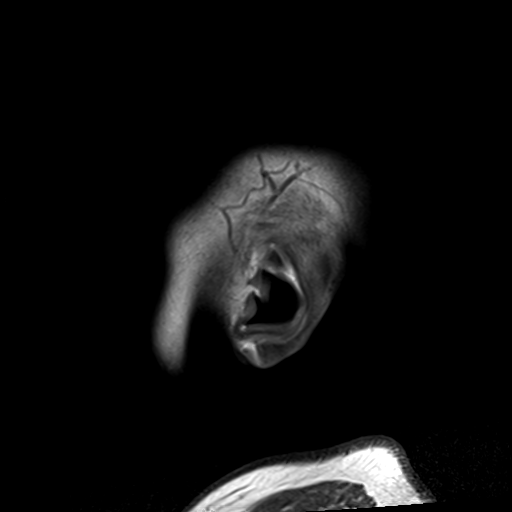

[Series 5: T1 · axial · 4.0mm · 0.45mm/px · 1 of 32 slices shown (2 of 2)]
[im 1/32]
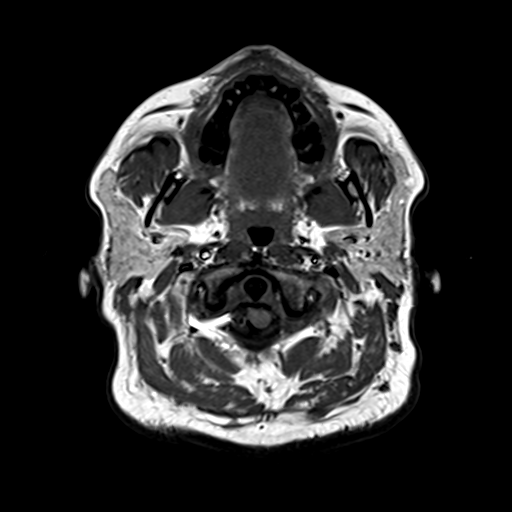

[Series 6: T2 · axial · 4.0mm · 0.72mm/px · z∈[-36,+144]mm · 7 of 32 slices shown (1 of 2)]
[im 1/32]
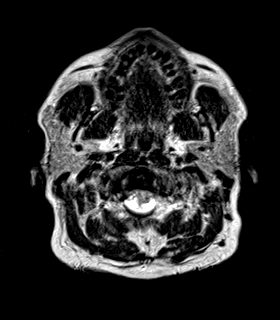
[im 6/32]
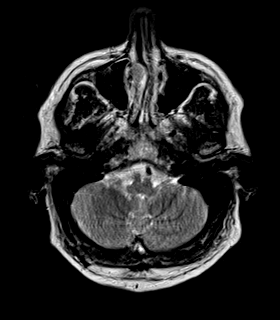
[im 11/32]
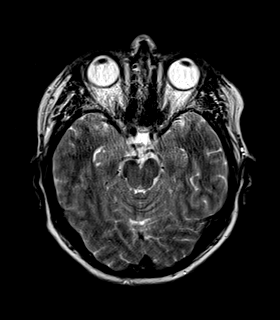
[im 16/32]
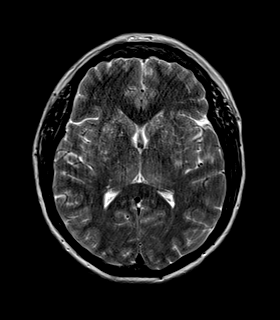
[im 21/32]
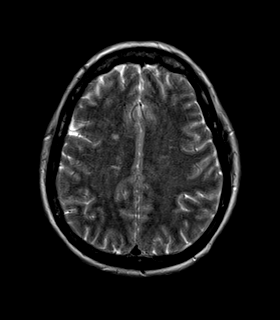
[im 26/32]
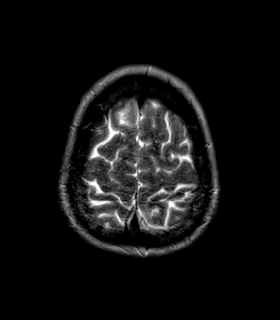
[im 32/32]
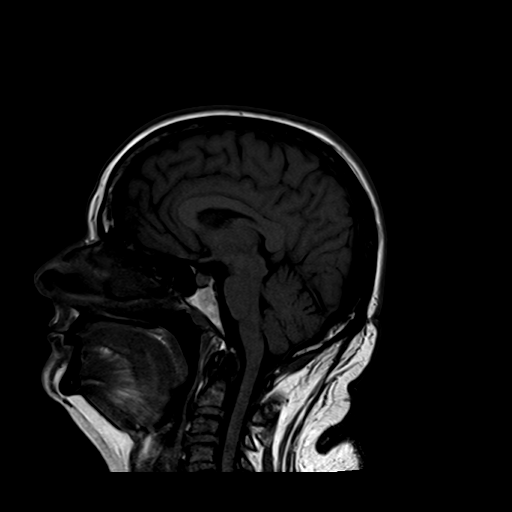

[Series 7: FLAIR · axial · 5.0mm · 0.45mm/px · z∈[-33,+141]mm · 5 of 24 slices shown]
[im 1/24]
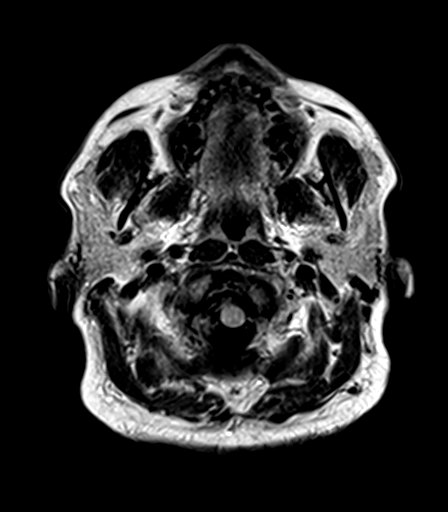
[im 6/24]
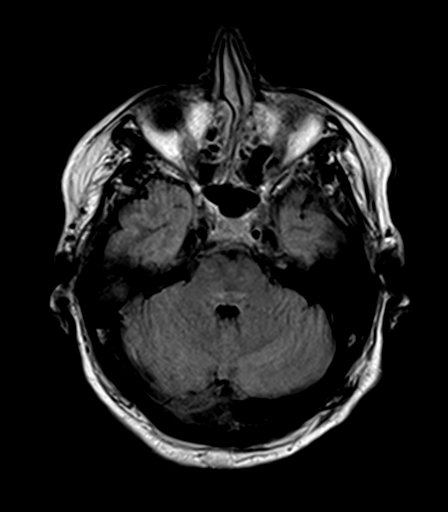
[im 12/24]
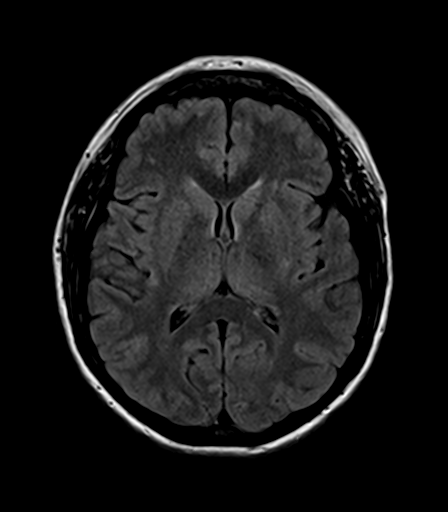
[im 18/24]
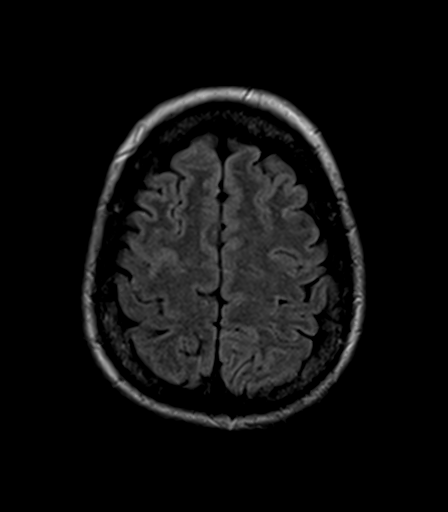
[im 24/24]
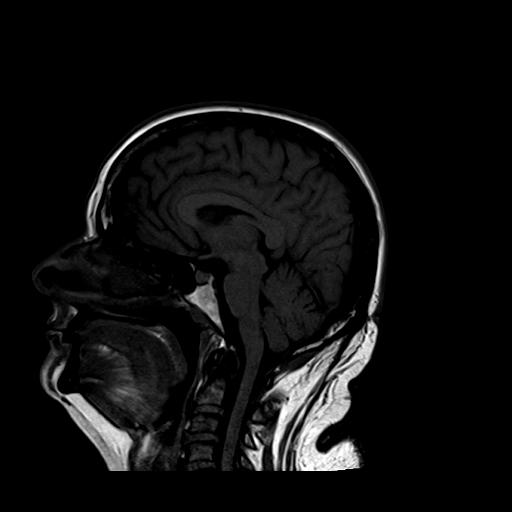

[Series 8: T2 · axial · 4.0mm · 0.72mm/px · z∈[-36,+144]mm · 7 of 32 slices shown (2 of 2)]
[im 1/32]
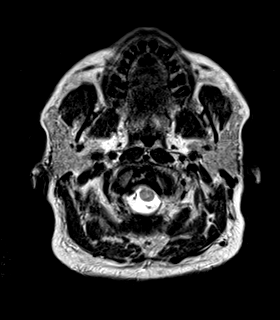
[im 6/32]
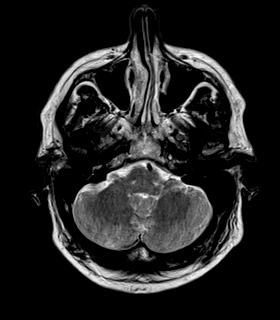
[im 11/32]
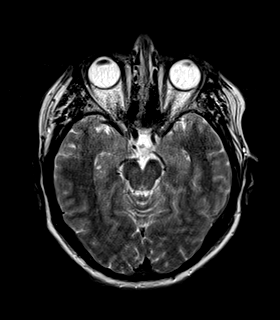
[im 16/32]
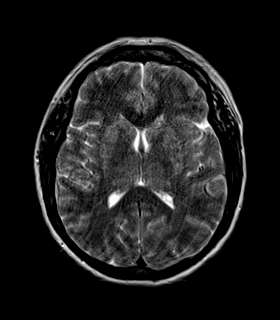
[im 21/32]
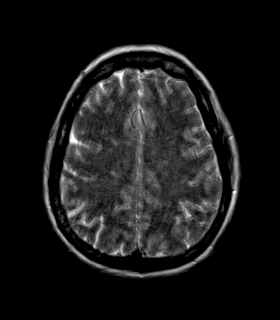
[im 26/32]
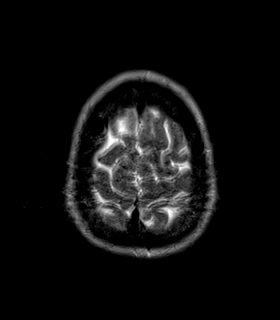
[im 32/32]
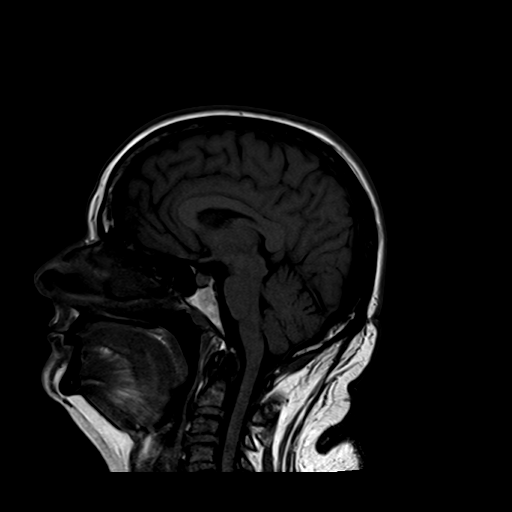

[Series 5002: ADC · axial · 5.0mm · 1.20mm/px · z∈[-33,+142]mm · 5 of 24 slices shown]
[im 1/24]
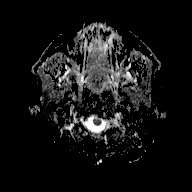
[im 6/24]
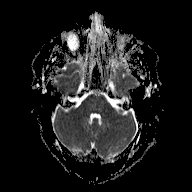
[im 12/24]
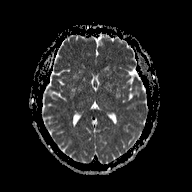
[im 18/24]
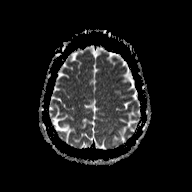
[im 24/24]
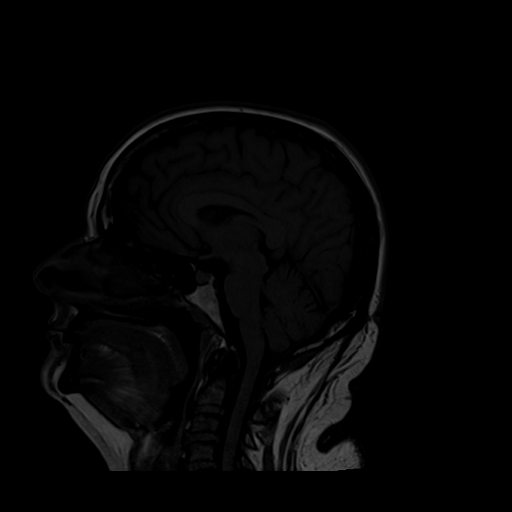

[Series 5003: DWI · axial · 5.0mm · 1.20mm/px · z∈[-33,+142]mm · 5 of 24 slices shown]
[im 1/24]
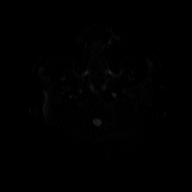
[im 6/24]
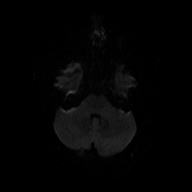
[im 12/24]
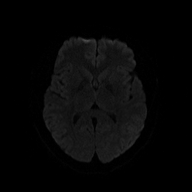
[im 18/24]
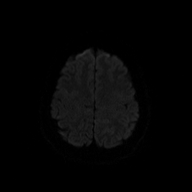
[im 24/24]
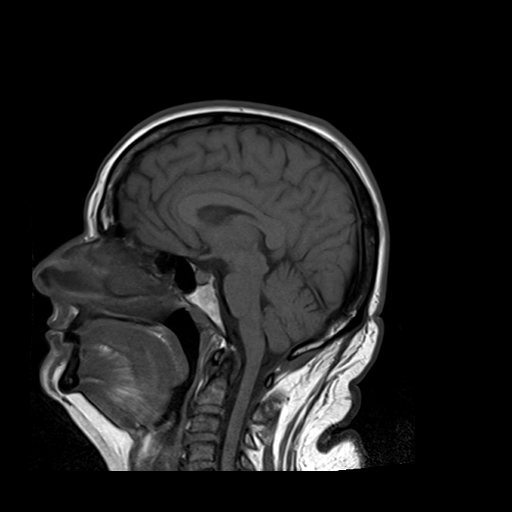

[34 of 48 positions shown; findings below may reference images not displayed]

FINDINGS: There is no acute infarct. There is no hemorrhage. There is no pathologic
extra-axial fluid collection. There is scattered foci of periventricular and
deep white matter T2 and FLAIR hyperintensity . There is no hydrocephalus.
The ventricles are normal for age. The basal cisterns are patent. There are
no abnormal extra-axial fluid collections.

The visualized paranasal sinuses and mastoid sinuses are clear. The skull
base and calvarium demonstrate normal signal. The major intracranial flow
voids, including dural venous sinuses appear patent.
IMPRESSION: No acute intracranial pathology.

There are scattered foci of T2 and FLAIR periventricular and deep white
matter hyperintensity which may be secondary to microangiopathic changes
versus vasculitis versus demyelinating process.

[REDACTED]

## 2014-05-08 IMAGING — US US CAROTID DUPLEX BILAT
1 series · 14 of 24 positions shown · non-contrast
Comparison: none

REASON FOR EXAM: CVA
COMMENTS:

PROCEDURE:     US  - US CAROTID DOPPLER BILATERAL  - May 25, 2012  [DATE]
RESULT:
TECHNIQUE: Grayscale, color flow, SPECTRAL waveform and Duplex color flow
Doppler imaging was performed of the right and left carotid systems.

[Series 1: us carotid duplex bilat · 0.08mm/px · 68 acquisitions, 14 frames shown]
[im 1/68]
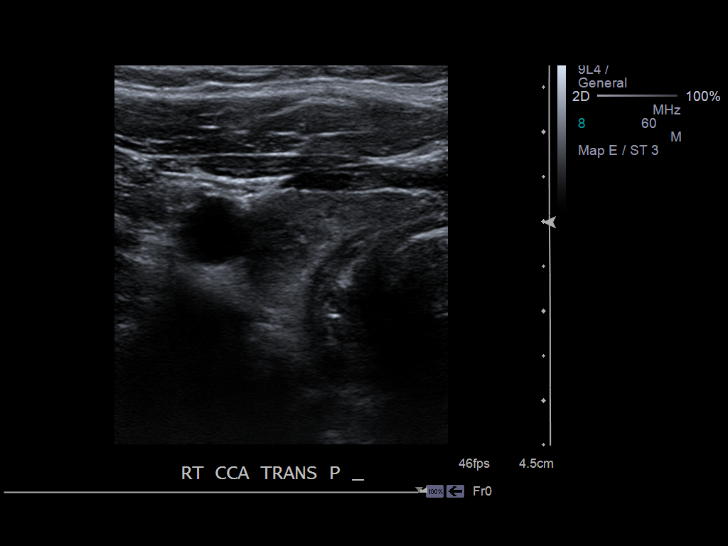
[im 6/68]
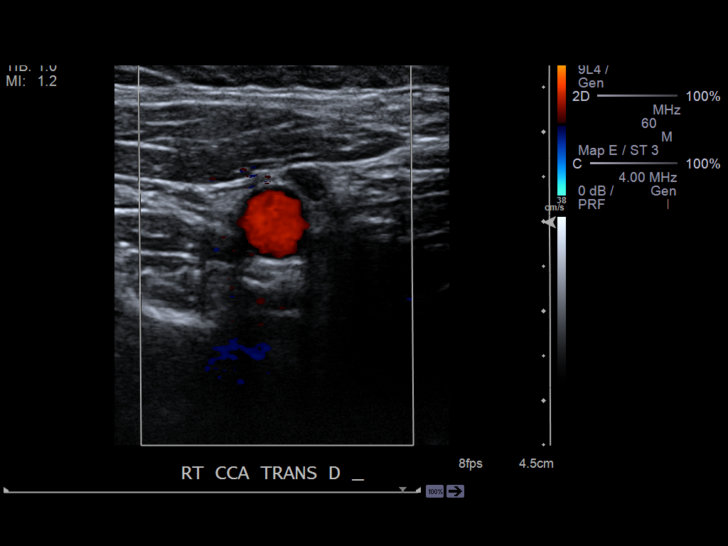
[im 12/68]
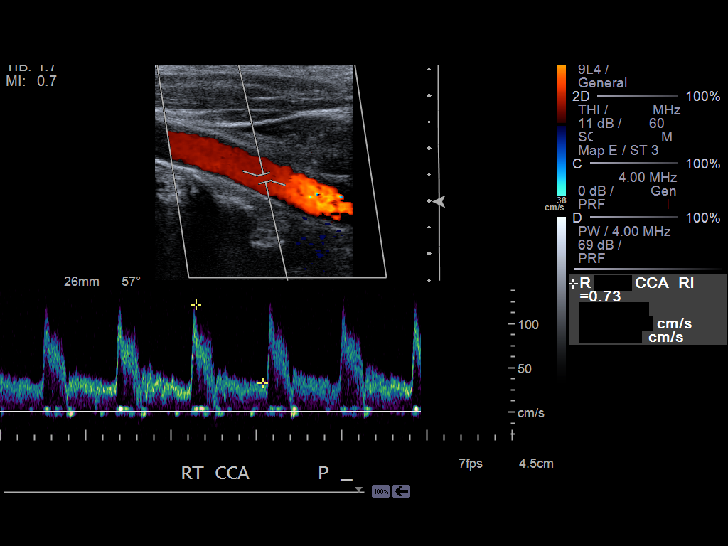
[im 18/68]
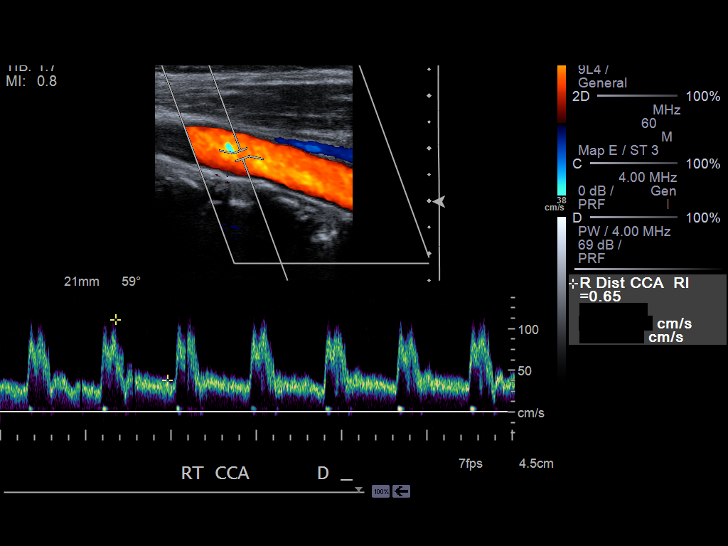
[im 21/68]
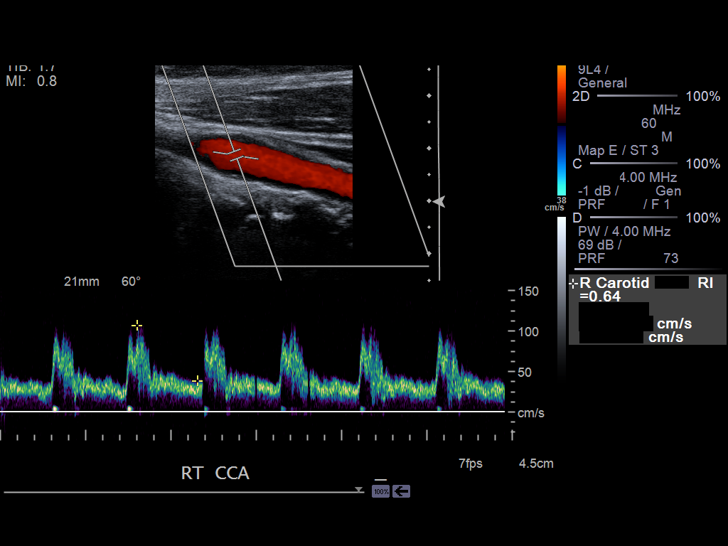
[im 27/68]
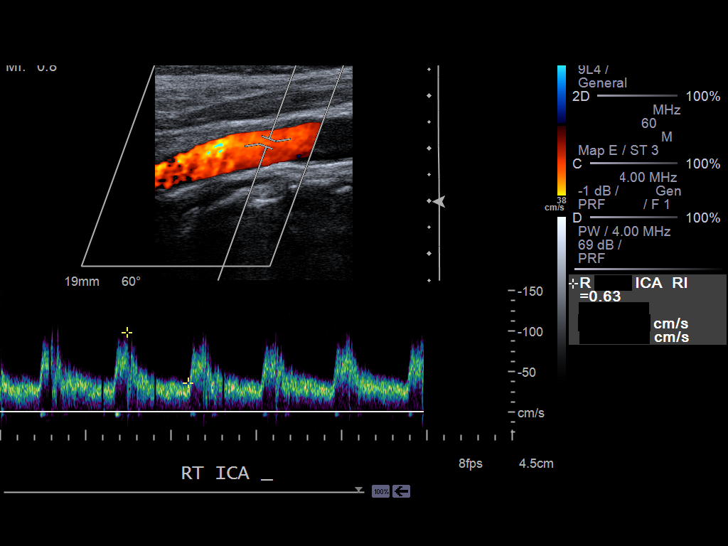
[im 33/68]
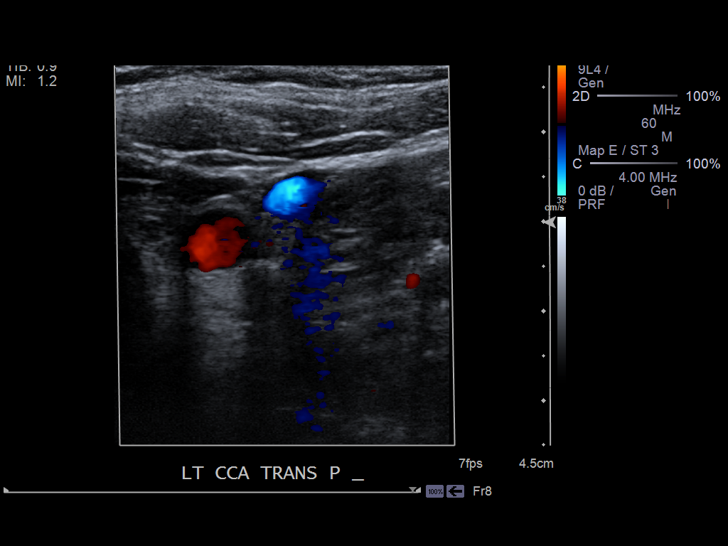
[im 38/68]
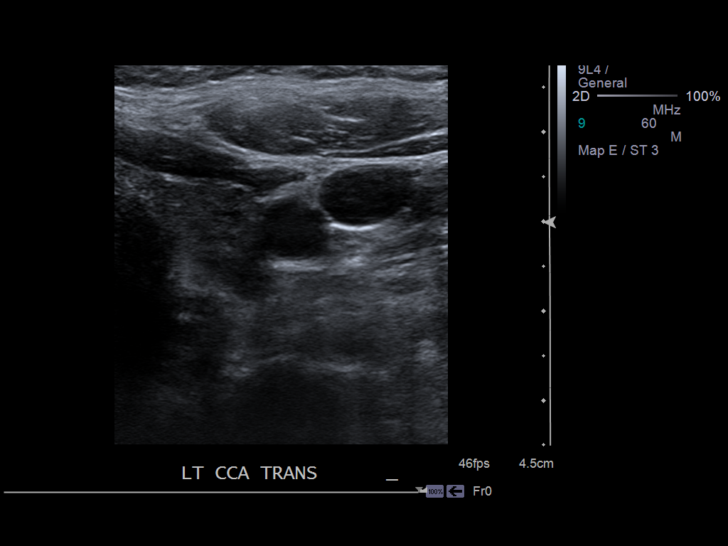
[im 44/68]
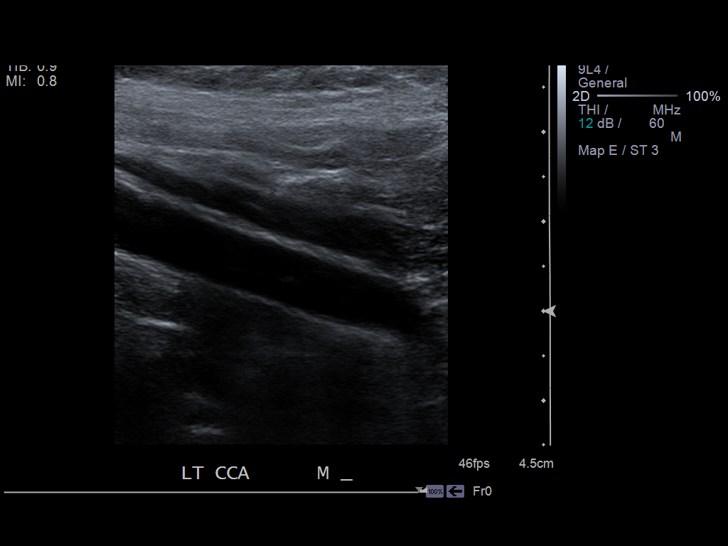
[im 50/68]
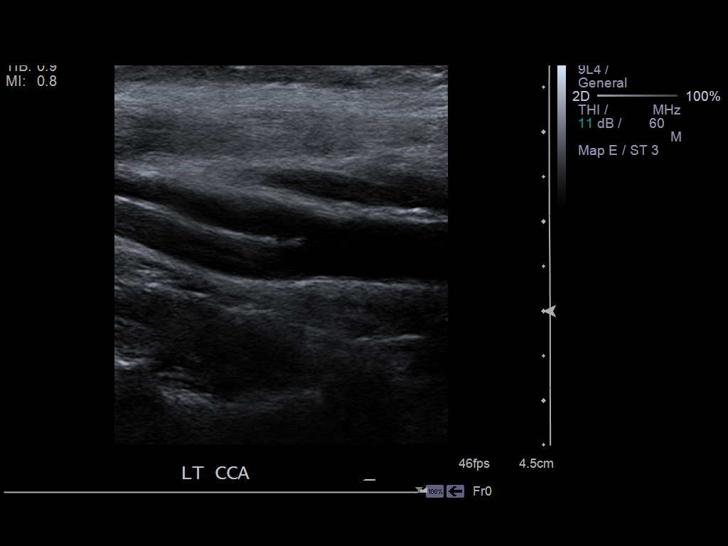
[im 56/68]
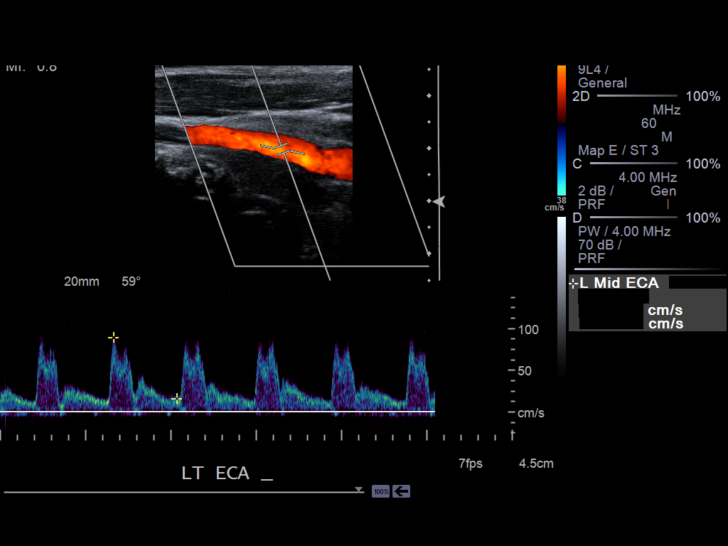
[im 59/68]
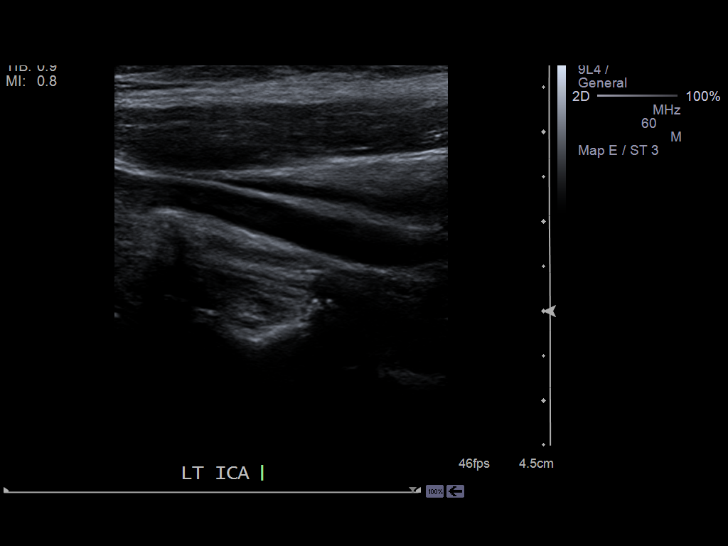
[im 65/68]
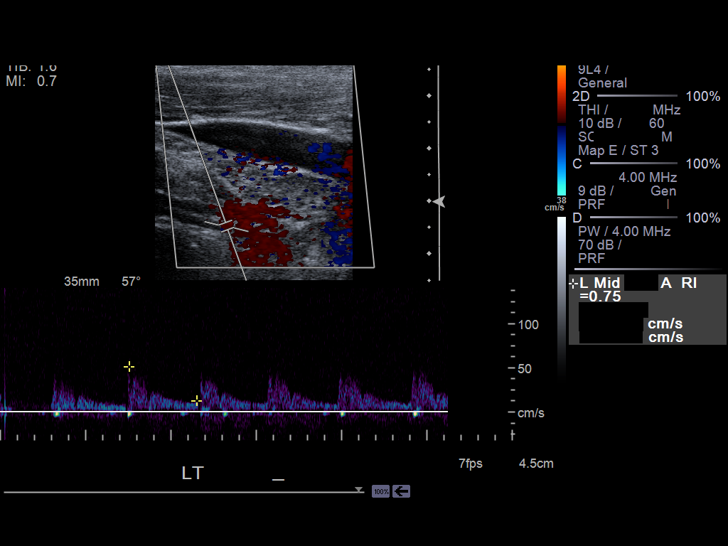
[im 68/68]
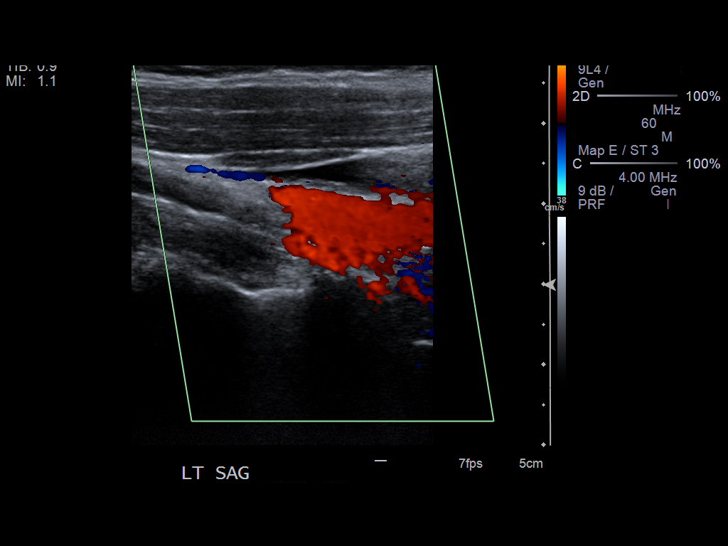

[14 of 24 positions shown; findings below may reference images not displayed]

Evaluation of the right carotid system demonstrates asymmetric plaque within
the internal carotid artery demonstrating less than 50% visual stenosis. No
further evidence of mural plaque is identified within the right or left
systems.

SPECTRAL waveform and color-flow imaging is unremarkable within the right
and left carotid systems.

ICA/CCA ratios:

Right: .81
Left:

Antegrade flow is identified within the right and left carotid arteries.
IMPRESSION: No evidence of hemodynamically significant stenosis within
the right or left carotid systems.

## 2014-05-08 IMAGING — CT CT HEAD WITHOUT CONTRAST
1 series · 16 of 30 positions shown, 20 images · non-contrast
Comparison: none

REASON FOR EXAM: headache, left sided paresthesia
COMMENTS:

PROCEDURE:     CT  - CT HEAD WITHOUT CONTRAST  - May 25, 2012  [DATE]
RESULT:     Comparison:  None
TECHNIQUE: Multiple axial images from the foramen magnum to the vertex were
obtained without IV contrast.

[Series 2: soft tissue · axial · 0.40mm/px · z∈[-162,-27]mm · 16 of 31 slices shown, 20 images]
[im 2/31  brain]
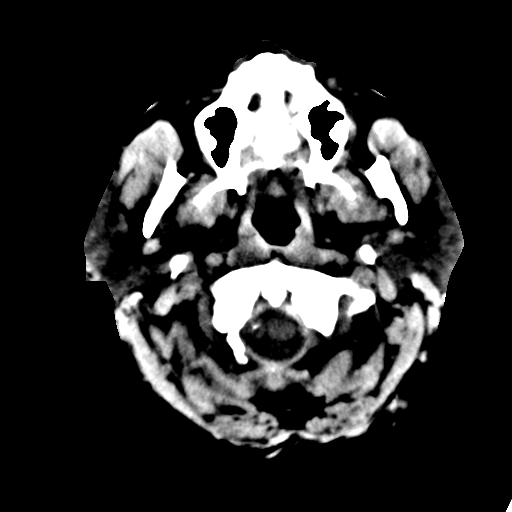
[im 2/31  bone]
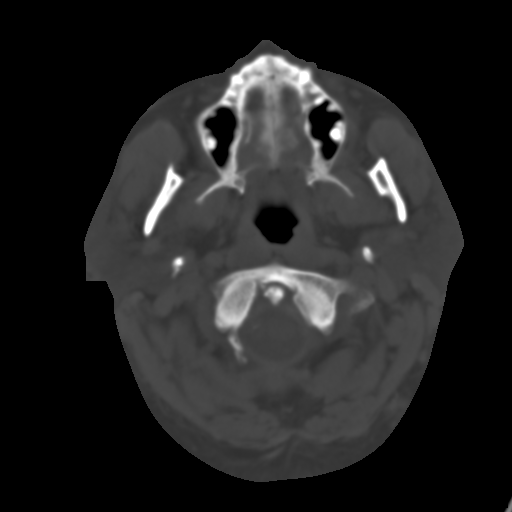
[im 4/31  brain]
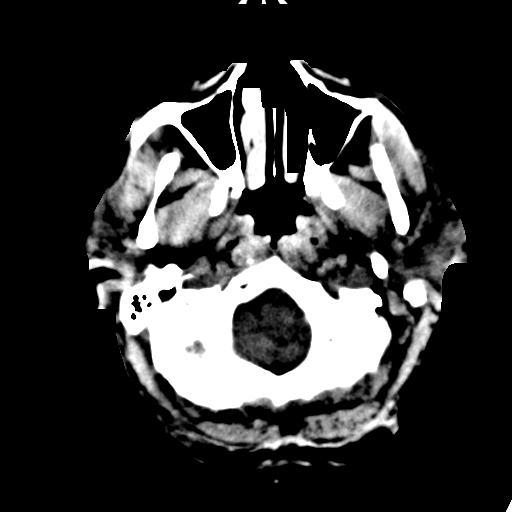
[im 6/31  brain]
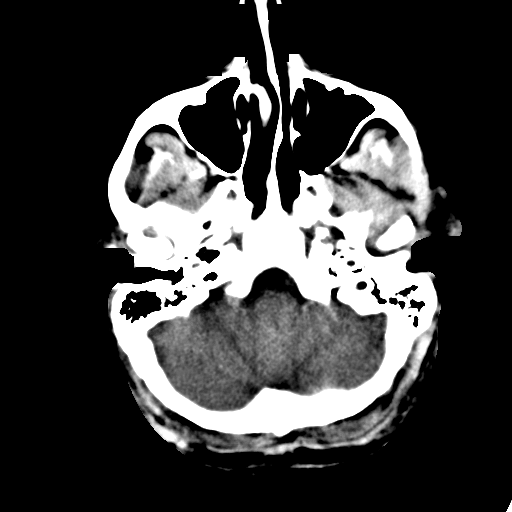
[im 8/31  brain]
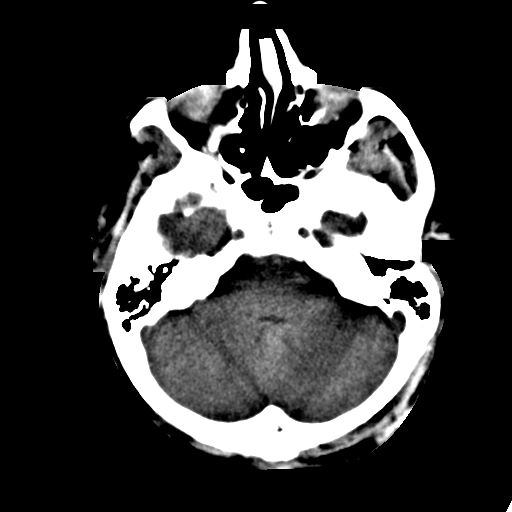
[im 9/31  brain]
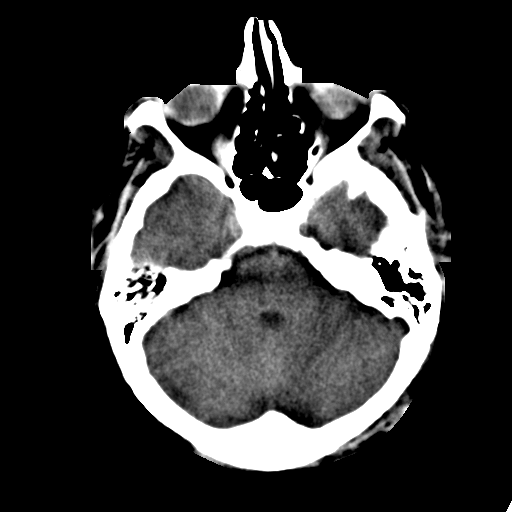
[im 9/31  bone]
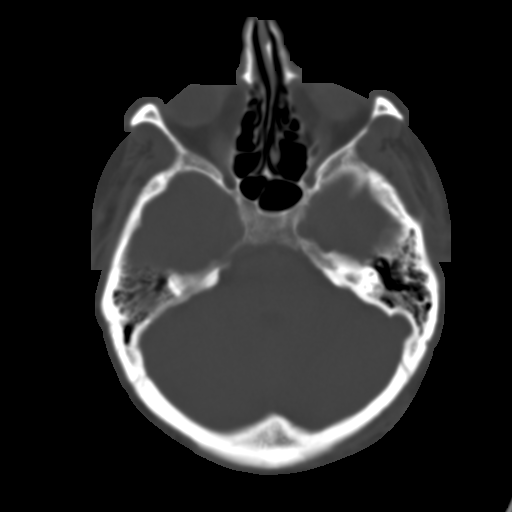
[im 11/31  brain]
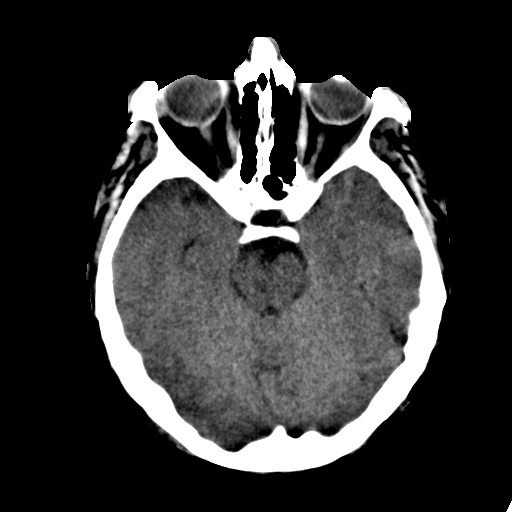
[im 13/31  brain]
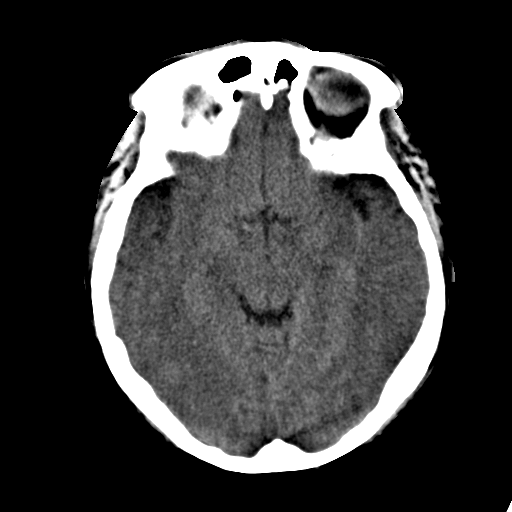
[im 15/31  brain]
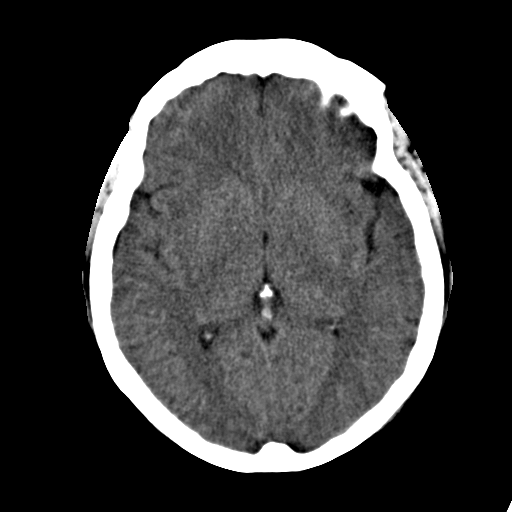
[im 16/31  brain]
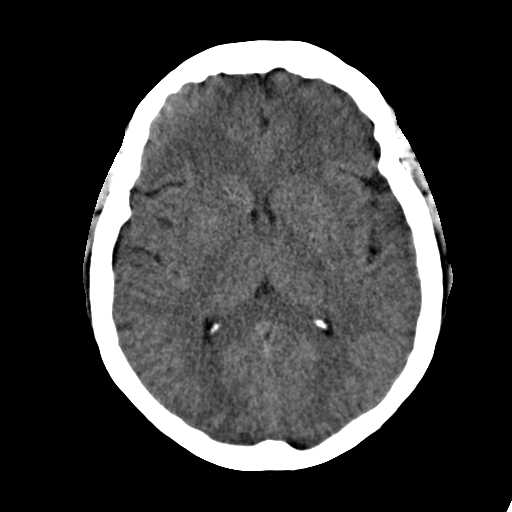
[im 16/31  bone]
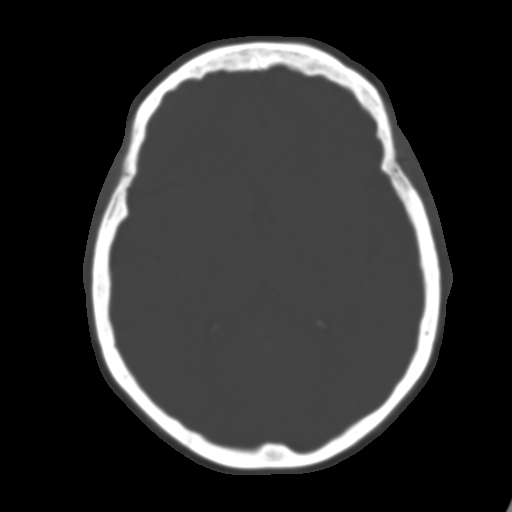
[im 18/31  brain]
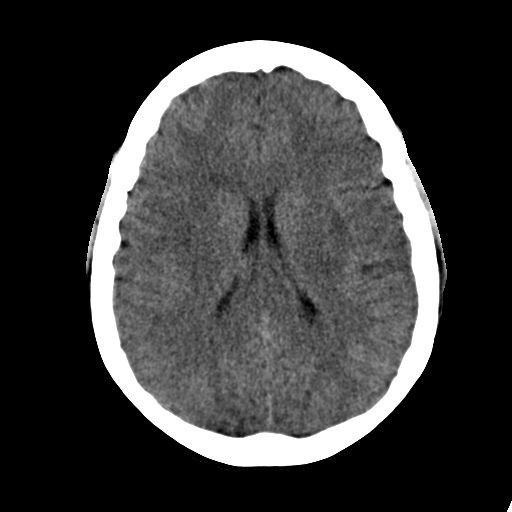
[im 20/31  brain]
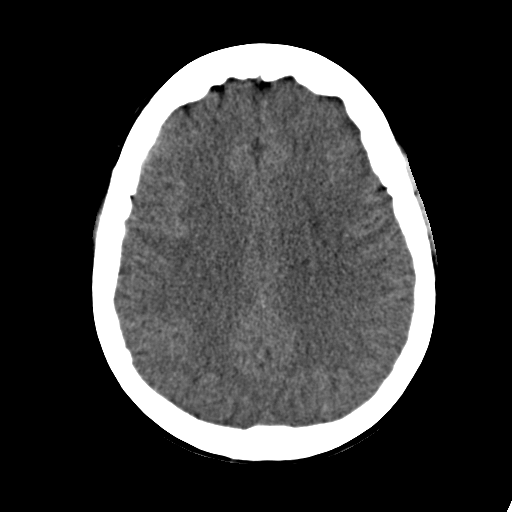
[im 22/31  brain]
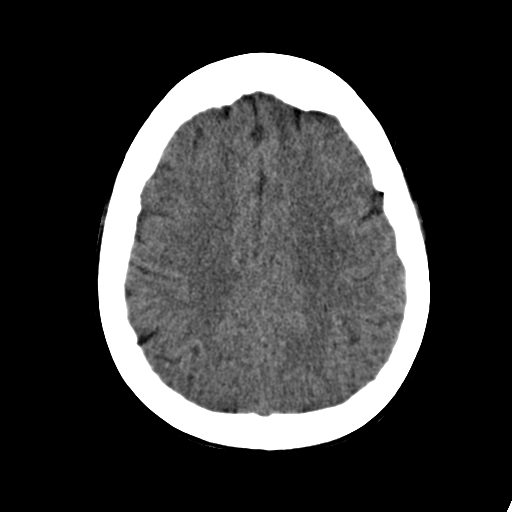
[im 23/31  brain]
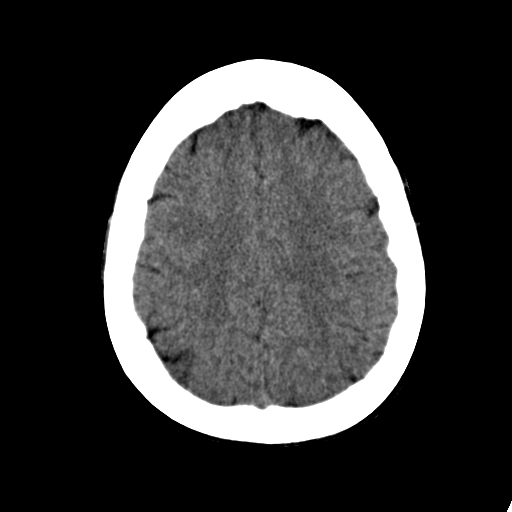
[im 23/31  bone]
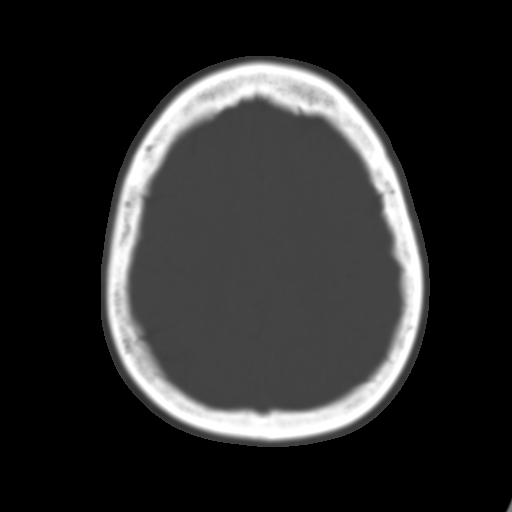
[im 25/31  brain]
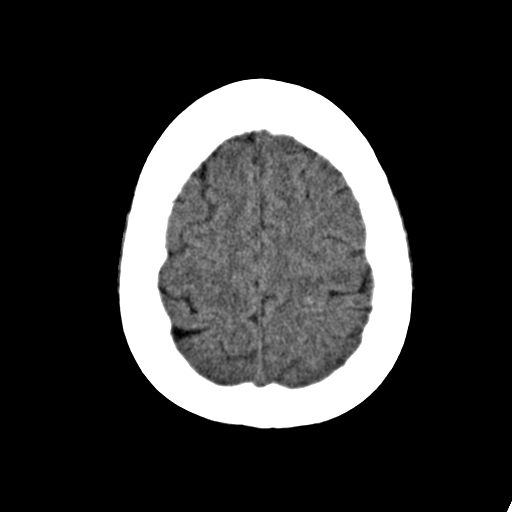
[im 27/31  brain]
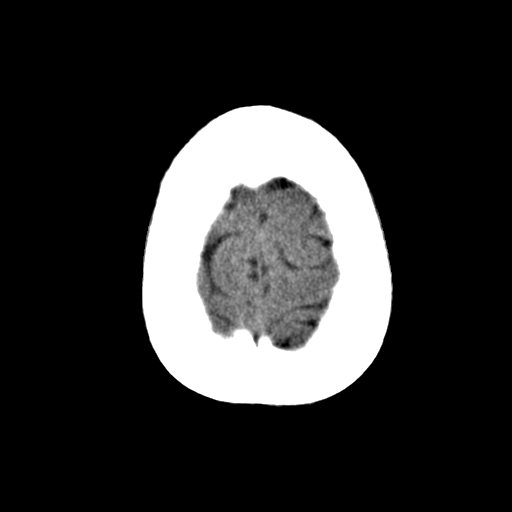
[im 29/31  brain]
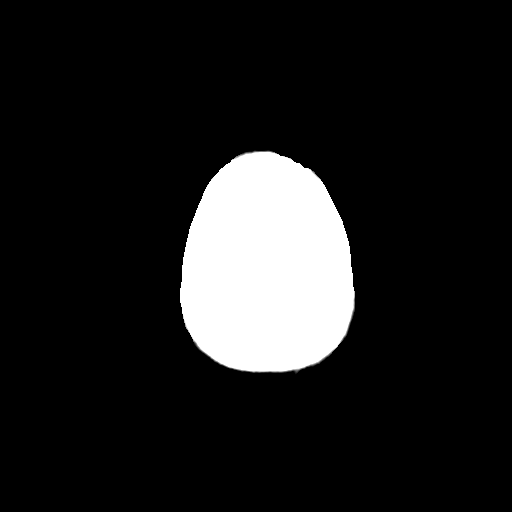

[16 of 30 positions shown; findings below may reference images not displayed]

FINDINGS: There is no evidence of mass effect, midline shift, or extra-axial fluid
collections.  There is no evidence of a space-occupying lesion or
intracranial hemorrhage. There is no evidence of a cortical-based area of
acute infarction.

The ventricles and sulci are appropriate for the patient's age. The basal
cisterns are patent.

Visualized portions of the orbits are unremarkable. The visualized portions
of the paranasal sinuses and mastoid air cells are unremarkable.

The osseous structures are unremarkable.
IMPRESSION: No acute intracranial process.

[REDACTED]

## 2014-05-09 IMAGING — MR MRA HEAD WITHOUT CONTRAST
1 series · 26 of 48 positions shown · non-contrast
Comparison: None

REASON FOR EXAM: left side weakness
COMMENTS:

PROCEDURE:     MR  - MRA BRAIN WO CONTRAST  - May 26, 2012  [DATE]
RESULT:     History: Left-sided weakness
TECHNIQUE: 3-D time-of-flight MRA images, without contrast, of the circle of
Willis were also obtained.

[Series 2: TOF · axial · 0.8mm · 0.39mm/px · z∈[-46,+21]mm · 26 of 92 slices shown]
[im 1/92]
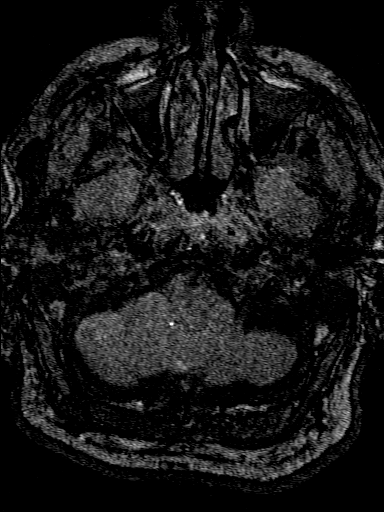
[im 2/92]
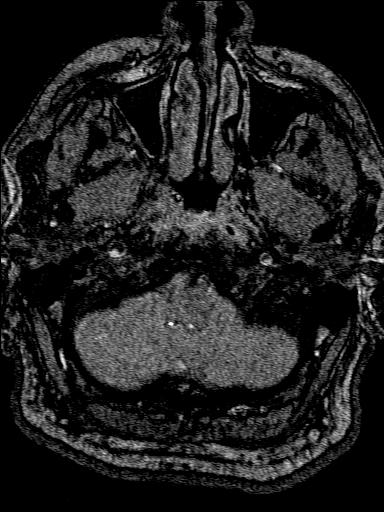
[im 4/92]
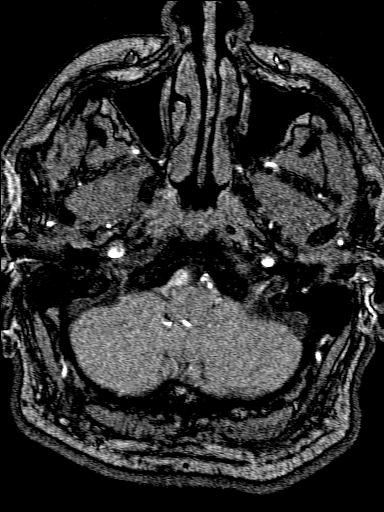
[im 6/92]
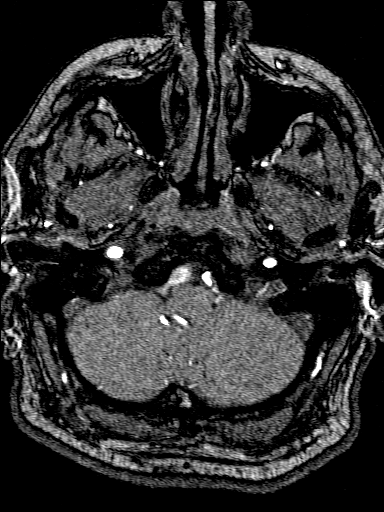
[im 8/92]
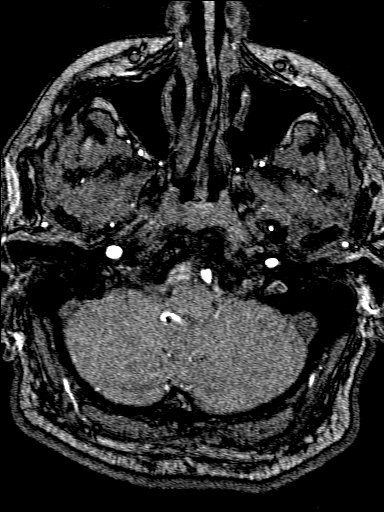
[im 10/92]
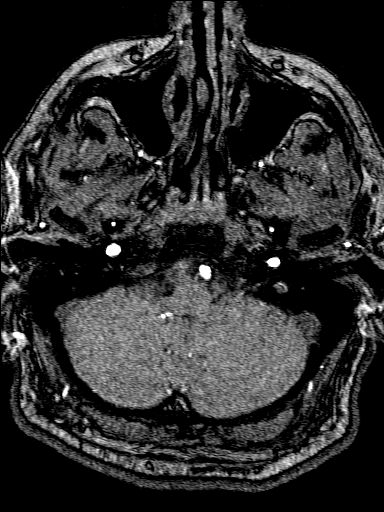
[im 12/92]
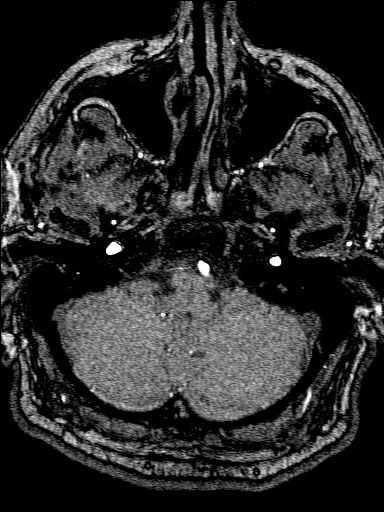
[im 14/92]
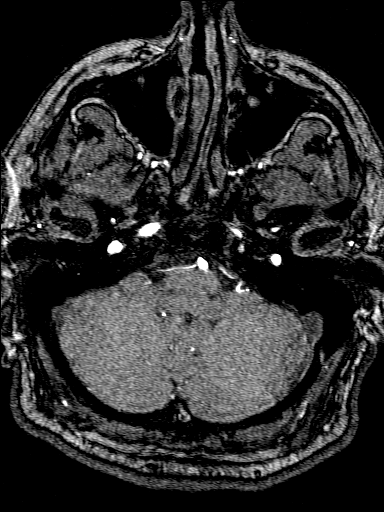
[im 16/92]
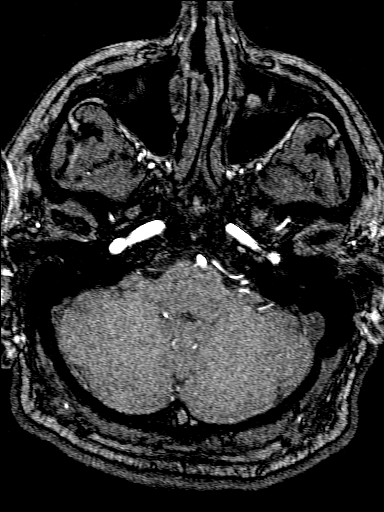
[im 18/92]
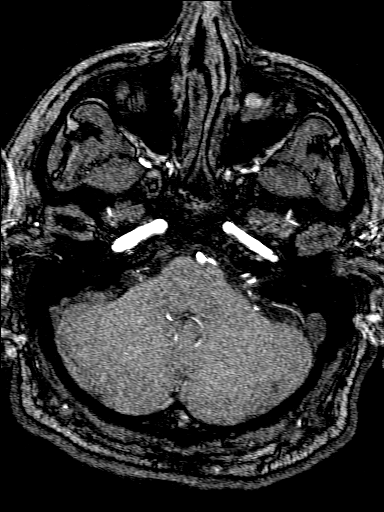
[im 20/92]
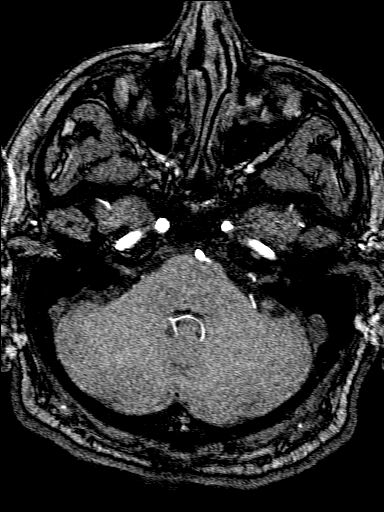
[im 22/92]
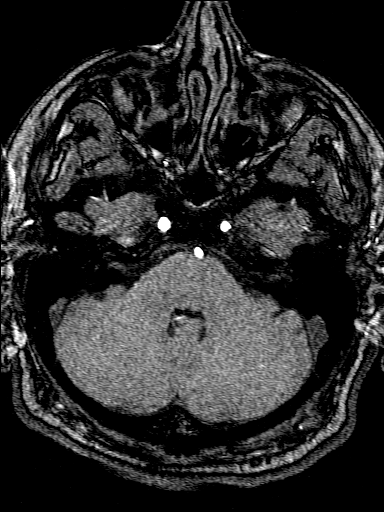
[im 24/92]
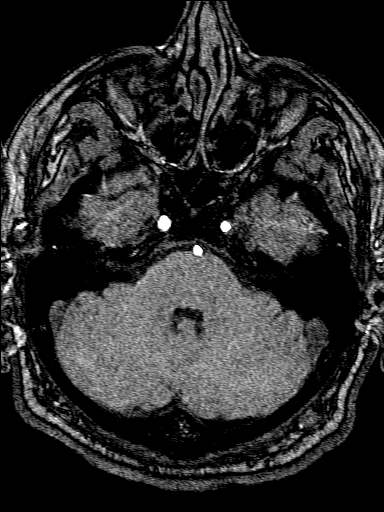
[im 26/92]
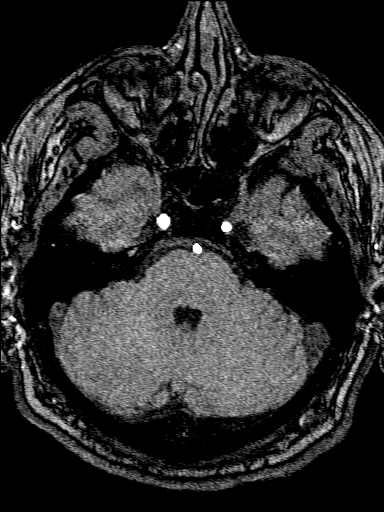
[im 28/92]
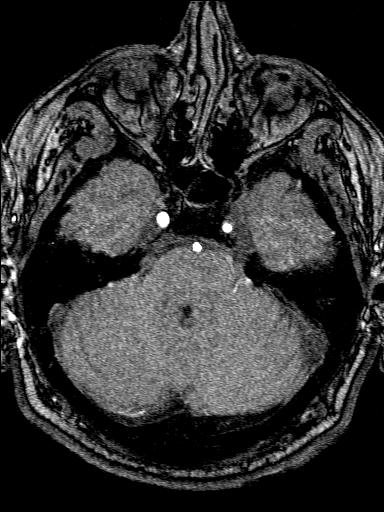
[im 30/92]
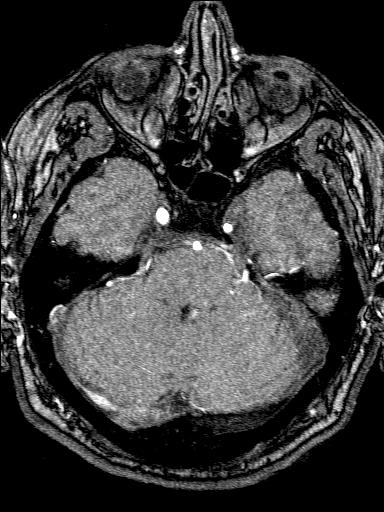
[im 31/92]
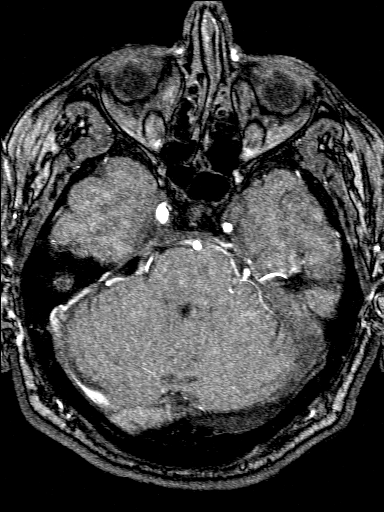
[im 33/92]
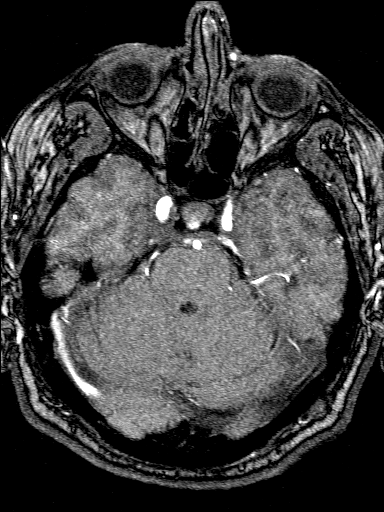
[im 35/92]
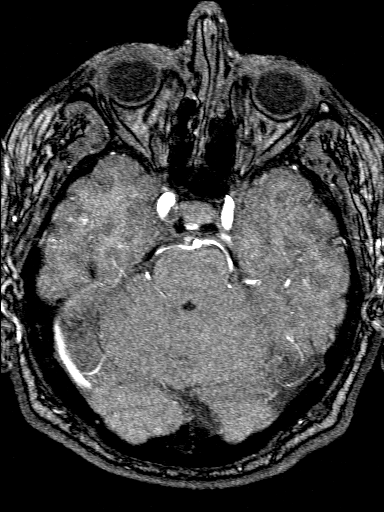
[im 41/92]
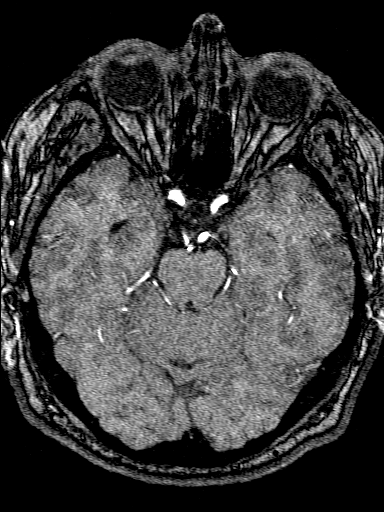
[im 47/92]
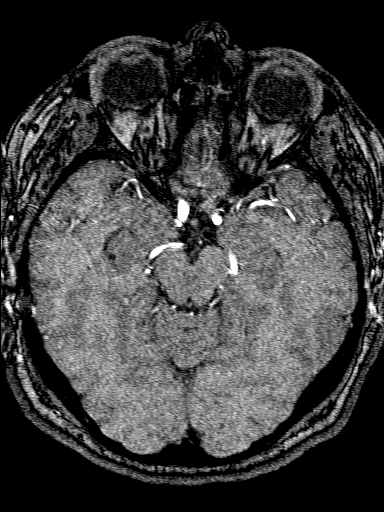
[im 53/92]
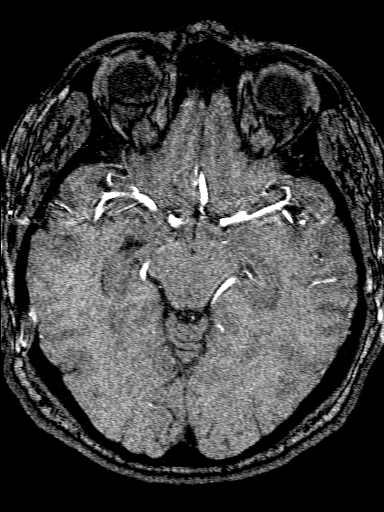
[im 64/92]
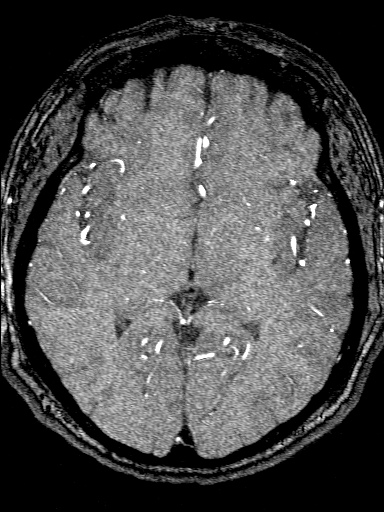
[im 76/92]
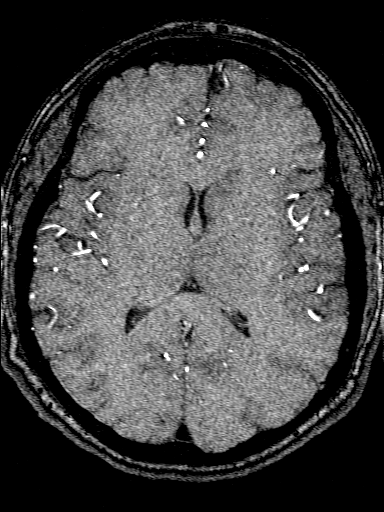
[im 78/92]
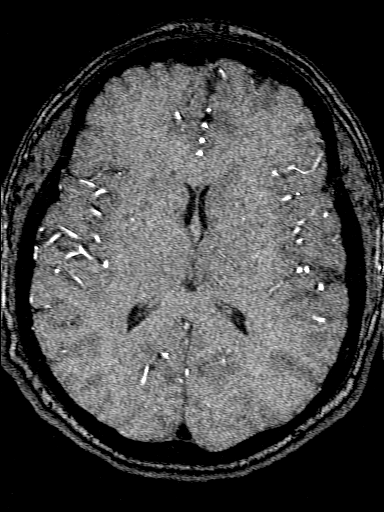
[im 88/92]
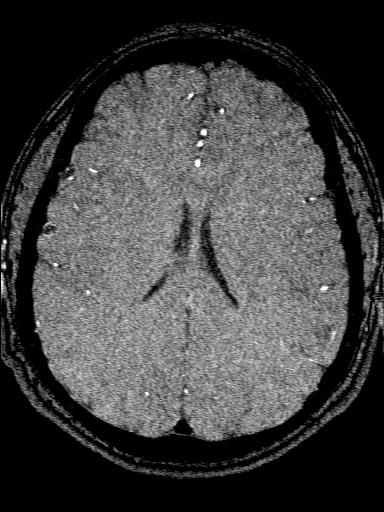

[26 of 48 positions shown; findings below may reference images not displayed]

FINDINGS: The intracranial internal carotid arteries, anterior cerebral arteries,
middle cerebral arteries and posterior cerebral arteries show no significant
abnormalities.

A small anterior communicating artery is present. There is a normal right
posterior communicating artery. The left posterior communicating artery is
not visualized. The distal vertebral arteries and basilar artery are normal
in appearance.

No aneurysm or significant intracranial stenosis demonstrated.
IMPRESSION: 1. No aneurysm or significant intracranial stenosis demonstrated.

[REDACTED]

## 2014-05-25 NOTE — H&P (Signed)
PATIENT NAME:  [name], REINER MR#:  [id number] DATE OF BIRTH:  [birth year]  DATE OF ADMISSION:  11/01/2012  PRIMARY CARE PHYSICIAN: Kirstie Peri. Caryn Section, MD  REFERRING PHYSICIAN:  Dr. Karma Greaser.  CHIEF COMPLAINT: Chest pain.   HISTORY OF PRESENT ILLNESS: A 44 year old [name] with a history of hypertension, diabetes, hyperlipidemia, presented to the ED with left arm pain for 2 days and chest pain for the past 1 day. The patient is alert, awake, oriented, in no acute distress. The patient said that she started to have left arm pain since yesterday and then developed left-sided chest pain today which is sharp, intermittent, no radiation. She also has some and nausea and mild sweating. The patient denies any headache, dizziness. No palpitation, orthopnea or nocturnal dyspnea. No leg edema. The patient had a stress test maybe 1 year or 2 years ago, has not gotten any heart workup.    PAST MEDICAL HISTORY: Hypertension, diabetes, hyperlipidemia, morbid obesity, migraine headache, CKD diagnosed about 7 months ago, also has a history of CVA.   PAST SURGICAL HISTORY: Post gastric bypass, hysterectomy, appendectomy, left shoulder surgery.   SOCIAL HISTORY: No smoking or drinking or illicit drugs.   FAMILY HISTORY:  Father had hypertension, diabetes, heart attack and stroke, also had colon cancer.   ALLERGIES: None.   HOME MEDICATIONS 1.  TriCor 140 mg p.o. daily.  2.  Topiramate 50 mg p.o. daily.  3.  Temazepam 50 mg p.o. at bedtime p.r.n. 4.  Zocor 20 mg p.o. at bedtime.  5.  Promethazine 25 mg IV every 6 hours p.r.n.  6.  NovoLog sliding scale p.r.n.  7.  Multivitamin 1 tablet p.o. b.i.d.  8.  Metformin 1000 mg p.o. b.i.d.  9.  Lansoprazole 30 mg p.o. daily.  10.  Gabapentin 300 mg p.o. t.i.d.  11.  Clonidine 0.1 mg p.o. b.i.d.  12.  Citalopram 40 mg p.o. daily.  13.  Bupropion 300 mg per 24 hours XL oral tablets 1 tablet a day.  14.  Benazepril 40 mg p.o. daily.  15.  Aspirin 325 mg  p.o. daily.  16.  Norvasc 10 mg p.o. daily.   REVIEW OF SYSTEMS CONSTITUTIONAL: The patient denies any fever or chills. No headache or dizziness. No weakness.  EYES: No double vision, blurred vision.  ENT: No postnasal drip, slurred speech or dysphagia.  CARDIOVASCULAR: Positive for chest pain, no palpitations. No orthopnea or nocturnal dyspnea. No leg edema.  PULMONARY: No cough, sputum, shortness of breath or hemoptysis.  GASTROINTESTINAL: No abdominal pain, nausea, vomiting or diarrhea. No melena or bloody stool.  GENITOURINARY: No dysuria, hematuria or incontinence.  SKIN: No rash or jaundice.  NEUROLOGIC: No syncope, loss of consciousness or seizure.  HEMATOLOGY: No easy bruising or bleeding.  ENDOCRINE: No polyuria, polydipsia, heat or cold intolerance.   PHYSICAL EXAMINATION VITAL SIGNS: Temperature 98.7, blood pressure was 205/117 and increased to 222/115, and now decreased to 146/58, pulse 94, O2 saturation 96% on room air.  GENERAL: The patient is alert, awake, oriented, in no acute distress.  HEENT: Pupils round, equal and reactive to light and accommodation. Moist oral mucosa. Clear oropharynx.  NECK: Supple. No JVD or carotid bruit. No lymphadenopathy. No thyromegaly.  CARDIOVASCULAR: S1, S2, regular rate and rhythm. No murmurs or gallops.  PULMONARY: Bilateral air entry. No wheezing or rales. No use of accessory muscles to breathe.  ABDOMEN: Obese, soft. Bowel sounds present. No organomegaly. No tenderness or distention.  EXTREMITIES: No edema, clubbing or cyanosis. No calf  tenderness. Strong bilateral pedal pulses.  SKIN: No rash or jaundice.  NEUROLOGY: A and O x 3. No focal deficit. Power 5/5. Sensation intact.   LABORATORY DATA: Glucose 420, BUN 8, creatinine 1.38, electrolytes are normal. Troponin less than 0.02.  WBC 12.9, hemoglobin 13.4, platelets 322. EKG showed sinus tachycardia at 112 BPM, left ventricular hypertrophy.   IMPRESSIONS 1.  Chest pain, possibly due  to hypertension malignancy but the need to rule out acute coronary syndrome.  2.  Hypertension malignancy.  3.  Acute renal failure on chronic kidney disease.  4.  Uncontrolled diabetes.  5.  Morbid obesity.   PLAN OF TREATMENT 1.  The patient will be placed for observation.  2.  We will continue aspirin, statin and ill get a stress test and follow up troponin level and lipid panel.  3.  Will continue hypertension medication, also we will give hydralazine IV p.r.n.  4.  For acute renal failure on CKD, we will give IV fluid support and follow up BMP.  5.  For uncontrolled diabetes, we will hold metformin and start a sliding scale, started Levemir 10 units at bedtime subcu and adjust the dose depending on patient's BS. I discussed patient's condition and plan of treatment with the patient. The patient is a full code.   TIME SPENT: About 52 minutes.     ____________________________ Demetrios Loll, MD qc:cs D: 11/01/2012 18:12:00 ET T: 11/01/2012 18:29:43 ET JOB#: [id number]  cc: Demetrios Loll, MD, <Dictator> Demetrios Loll MD ELECTRONICALLY SIGNED 11/03/2012 14:48

## 2014-05-25 NOTE — H&P (Signed)
PATIENT NAME:  [name], [name] MR#:  [id number] DATE OF BIRTH:  [birth year]  DATE OF ADMISSION:  01/28/2013  REASON FOR ADMISSION AND CHIEF COMPLAINT: Left-sided weakness.   REFERRING PHYSICIAN: Caryn Bee A. Paduchowski, MD  PRIMARY CARE PHYSICIAN: Demetrios Isaacs. Fisher, MD  HISTORY OF PRESENT ILLNESS: This is a very nice 44 year old female who has history of a previous CVA at the age of 10, severe headaches, migraine, CKD, hypertension, diabetes and hyperlipidemia, who comes today with a history of waking up at 1:00 a.m. in the morning feeling like her arm was numb, maybe because she slept on top of it, but then when she got up to go to the bathroom, she started feeling like her leg was really weak and very numb as well, and she was not able to walk properly. This morning, the symptoms persisted, for which she decided to come to the ER. Her left side was not able to be moved, especially in the lower extremity, today, and she had a headache that is stable. She has headaches every day, and she does not put attention to them. This headache does not look any different or more intense than her usual headaches. It is posterior, dull and occasionally gets sharp. She says that she has chronic nausea, for which she takes Phenergan every day, and that has not changed. There is no chest pain or any other problems. The patient apparently was treated at [facility] and then transferred to [facility] at age 63. We are going to request records.   REVIEW OF SYSTEMS: A 12-system review of systems is done.  CONSTITUTIONAL: No fever, chills, weight loss or weight gain.  EYES: No double vision right now. The patient states she has occasional blurry vision, but not right now, and it is related with headaches.  EARS, NOSE, THROAT: No postnasal drip. No difficulty swallowing. No slurred speech or dysphagia.  CARDIOVASCULAR: No chest pain. No shortness of breath, palpitations or orthopnea. No leg edema.  PULMONARY: No cough, wheezing or  shortness of breath. No hemoptysis.  GASTROINTESTINAL: No abdominal pain, constipation, diarrhea or melena.  GENITOURINARY: No dysuria, hematuria or changes in frequency. The patient actually has a urinary tract infection.  SKIN: No rashes, petechiae or new lesions.  NEUROLOGIC: Positive CVA in the past. Positive chronic headaches.  HEMATOLOGIC AND LYMPHATIC: No anemia, easy bruising or bleeding.  MUSCULOSKELETAL: No significant neck pain or back pain. No trauma to the back or neck.  PSYCHIATRIC: No significant anxiety or depression at this moment, but she has history of chronic anxiety and chronic depression. The patient feels her normal self now.  ENDOCRINOLOGY: No polyuria, polydipsia or polyphagia, cold or heat intolerance.   PAST MEDICAL HISTORY: 1. Hypertension.  2. Diabetes.  3. Hyperlipidemia.  4. Morbid obesity.  5. Migraine headaches.  6. CKD. 7. CVA at the age of 74.   PAST SURGICAL HISTORY:  1. Gastric bypass.  2. Hysterectomy.  3. Appendectomy.  4. Left shoulder surgery.   SOCIAL HISTORY: Never smoked. Does not use drugs. She is on medical leave because of her multiple problems, including her headaches. 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 She lives with her fiance and her son. She quit smoking, but she only smoked through high school as a social smoker.   FAMILY HISTORY: Her father had hypertension, diabetes and MI with a stroke and colon cancer.   ALLERGIES: Not known drug allergies.   CURRENT MEDICATIONS:  1. TriCor 145 mg once daily.  2. Simvastatin  20 mg once a day. 3. NovoLog sliding scale.  4. Multivitamin once daily.  5. Metformin 1000 mg twice daily. 6. Lansoprazole 30 mg twice daily. 7. Invokana 300 mg once a day.  8. Gabapentin 300 mg 3 times daily. 9. Clonidine 0.2 mg twice daily.   10. Citalopram 40 mg once daily. 11. Coreg 3.125 mg twice daily. 12. Bupropion 300 mg once a day. 13. Benazepril 40 mg once a day.  14. Aspirin 325 mg once a day.   15. Amlodipine 10 mg once a day.   PHYSICAL EXAMINATION:  VITAL SIGNS: Blood pressure 108/54, pulse 64, respirations 17, oxygen saturation 98%, temperature 97.6. Initially, her pulse was 70, respirations 18 and her blood pressure was 146/78, right now 108/54.  GENERAL: The patient is alert and oriented x3, in no acute distress. No respiratory distress. Hemodynamically stable. She is in bed, comfortable in a dark room.  HEENT: Her pupils are equal and reactive. Extraocular movements are intact. Mucosa are moist. Anicteric sclerae. Pink conjunctivae. No oral lesions. No oropharyngeal exudates.  NECK: Supple. No JVD. No thyromegaly. No adenopathy. No carotid bruits.  CARDIOVASCULAR: Regular rate and rhythm. No murmurs, rubs or gallops are appreciated. No displacement of PMI.  LUNGS: Clear, without any wheezing or crepitus. No use of accessory muscles. No dullness to percussion.  ABDOMEN: Soft, nontender, nondistended. No hepatosplenomegaly. No masses. Bowel sounds are positive. No rebounds.  GENITAL: Deferred.  EXTREMITIES: No significant edema, tenderness or clubbing.  VASCULAR: Pulses +2. Capillary refill less than 3.  PSYCHIATRIC: Flat affect.  NEUROLOGIC: Cranial nerves II through XII intact. No facial droop. Tongue is central. Uvula is central. Extraocular movements are intact. Campimetry is normal. The patient is able to raise her eyebrows without any asymmetric abnormalities. She is able to raise her shoulders with some degree of resistance at the level of the left shoulder. There is some drift of the left upper extremity whenever asked to hold in front of her, but it does it periodically. Sometimes, she can hold it, sometimes she cannot, and it is not a continuous drift. The patient just drops the arm right away or holds it for a little while. It does not correlate very well. Her sensation is decreased in both extremities, left upper and left lower. Her Babinski's are downward bilaterally, and  she has DTRs +2 bilaterally. Her strength is 1 out of 5 in the left lower extremity, at least 4 out of 5 on the left upper extremity and normal 5 out of 5 in right lower and right upper extremity. Cerebellar tests are intact. No dysmetria. Good rapid alternating movements. Good finger-to-nose test.  SKIN: Normal, without any rashes or petechiae.  LYMPHATIC: Negative for lymphadenopathy.  RESULTS: Her glucose is 228, creatinine 1.45, sodium 136, GFR is around 44, AST is 42, alkaline phosphatase 138. White count 7.6, hematocrit 39.1. Urinary: Positive white blood cells, positive red blood cells in urine, positive nitrites.   ASSESSMENT AND PLAN: This is a 44 year old female with left body numbness and weakness, with multiple risk factors, including hypertension, diabetes, hyperlipidemia and a previous cerebrovascular accident.   1. Left lower and upper extremity numbness and weakness, rule out cerebrovascular accident. The patient has continuation of these symptoms since the beginning at 1:00 a.m. There are no signs of them resolving, for which is more compatible with what could be a cerebrovascular accident, although her physical exam does not correlate all the symptoms, especially in the upper extremity, but her left lower extremity is very  numb and is very limp. I can get some strength giveaway at the level of the left upper extremity. So, if she is having a cerebrovascular accident, she is out of the window for tPA. The patient started on aspirin. Continue her cholesterol medication. Try to allow permissive hypertension. The patient is not hypertensive at this moment. She has high blood pressure, and she states it usually runs high, but right now, her blood pressure is in the 100s, for which we are going to hold all her medications except her Coreg, unless her systolic allows it. If her systolic stays below 120, we are going to hold the Coreg as well. The case was consulted with Dr. Loretha Brasil, who is going  to help [place] in evaluating this patient. She did have an ultrasound of the carotid arteries this year, which showed less than 50% stenosis, for which I do not think there is a need to repeat. Get an MRI today and echocardiogram. Get records from [facility] from her previous stroke. If she indeed has a stroke, consider for further workup, including an echocardiogram to rule out PFO as well as a possible TEE or a Holter monitoring. Consider the possibility of the patient having other coagulopathy, consider lupus anticoagulant. Overall, the patient is stable. We are going to admit her and observe her overnight.  2. For her blood pressure, continue Coreg unless her systolic is below 120. Allow permissive hypertension.  3. For her hyperlipidemia, continue TriCor and Zocor.  4. For her headaches, continue symptomatic treatment.  5. For her nausea, continue her chronic promethazine.  6. Diabetes. The patient also actually has chronic kidney disease with creatinine which is above 1.3, which is the cutoff for metformin in a female who has kidney disease due to the risk of metabolic acidosis. The patient's metformin is going to be held. If tomorrow her creatinine is better, will restart. For now, just keep her on insulin sliding scale. Consider the possibility of glipizide as treatment for her diabetes or Januvia.  7. Gastrointestinal prophylaxis with Protonix. 8. Deep venous thrombosis prophylaxis with heparin.   TIME SPENT: I spent about 45 minutes with this patient.    ____________________________ Felipa Furnace, MD rsg:lb D: 01/28/2013 13:15:57 ET T: 01/28/2013 14:01:20 ET JOB#: [id number]  cc: Felipa Furnace, MD, <Dictator> Monai Hindes Juanda Chance MD ELECTRONICALLY SIGNED 01/30/2013 0:52

## 2014-05-25 NOTE — Discharge Summary (Signed)
PATIENT NAME:  [name], [name] MR#:  [id number] DATE OF BIRTH:  [birth year]  DATE OF ADMISSION:  11/01/2012 DATE OF DISCHARGE:  11/02/2012  PRIMARY CARE PHYSICIAN: Dr. Caryn Section.   PRIMARY CARDIOLOGIST: Dr. [name]   CHIEF COMPLAINT: Chest pain, pressure.   DISCHARGE DIAGNOSES: 1.  Chest pain. I ruled out for acute coronary syndrome, likely secondary to malignant accelerated  hypertension.  2.  Malignant accelerated hypertension.  3.  diabetes.  4.   Hyperlipidemia.  5.  Obesity.  6.  History of migraines.  7.  History of chronic kidney disease. 8.  History of stroke.  9.  Status post gastric bypass.  10.  Hypomagnesemia.   DISCHARGE MEDICATIONS:  Metformin 1000 mg 2 times a day, amlodipine 10 mg daily, benazepril 40 mg daily, citalopram 40 mg daily, gabapentin 300 mg 3 times a day, bupropion extended release 300 mg once a day, NovoLog sliding scale, aspirin 325 mg daily, simvastatin 20 mg at bedtime, multivitamin 1 tab 2 times a day, lansoprazole 30 mg daily, TriCor 145 mg once a day, topiramate 50 mg once a day, clonidine 0.2 mg 2 times a day, carvedilol 3.125 mg 2 times a day.   DIET: Low sodium, low fat, low cholesterol, ADA diet.   ACTIVITY: As tolerated.   DISCHARGE INSTRUCTIONS:  1.  Please follow with PCP within 1 to 2 weeks.  2.  Please follow with Dr. [name] within a week for blood pressure check and monitoring as well as issues with your heart.  3.  Please follow with Dr. Gabriel Carina endocrinologist within 2 to 4 weeks.   DISPOSITION: Home.   SIGNIFICANT LABS AND IMAGING: A stress test done October 01 showing no significant ischemia and no significant wall motion abnormalities. Estimated EF is 47%. There are equivocal EKG changes. There is GI uptake artifact noted in the study. Overall this is a low risk scan. X-ray of the chest, PA and lateral on admission: No evidence of acute cardiopulmonary disease. Initial BUN 8, creatinine 1.38, creatinine the following day was 1.14. Initial  sodium 136, potassium 3.8. HDL is 19, LDL 55. Magnesium today was 1.2. Triglycerides are 367. Troponins negative x 2. TSH 0.88. Initial WBC was 12.9, last WBC of 9, platelets of 322 initially.   HISTORY OF PRESENT ILLNESS: AND HOSPITAL COURSE: For full details of H and P, please see the dictation on 09/30 by Dr. Bridgett Larsson but briefly this is a pleasant 44 year old obese female with history of hypertension, stroke, diabetes, hyperlipidemia, who came in with left arm pain since the day before admission, which developed with left-sided involvement of the chest,  sharp intermittent, no radiation, associated with some nausea and mild sweats. She was admitted to the hospitalist service under observation and was also noted to have significantly elevated blood pressure on arrival. Her initial blood pressure 193/110 but blood pressures peaked at 222/115. She was admitted to telemetry unit. She stated that her blood pressures have been running high for a few weeks. She had been recently started on Coreg by her nephrologist. She was ruled out for acute coronary syndrome with cyclic cardiac markers, had no further issues with chest pains and underwent a stress test. I discussed the results of the stress test with Dr. [name]. This is overall a low-risk study, but the patient has multiple risk factors including uncontrolled high blood pressure, suboptimally controlled diabetes and hyperlipidemia. I discussed all these concerns with the patient and we are in the process of making an appointment for her to  follow with an endocrinologist to treat these issues and also Dr. [name] is gracious enough to see the patient within a week for blood pressure management and further evaluation.   DIET: Adherence to her medication, eating healthy was strongly stressed to her and at this point as her symptoms are better with the low risk stress test, we will go ahead and discharge her. She had some magnesium replaced today. The symptoms are likely  secondary to malignant accelerated hypertension, which was on arrival, and at this point, we had increased her clonidine to twice a day and further blood pressure management to be done by Dr. [name] as an outpatient.   CODE STATUS: The patient is FULL CODE.    Total time spent is 40 minutes overall.  ____________________________ Vivien Presto, MD sa:dp D: 11/02/2012 14:28:54 ET T: 11/02/2012 16:00:01 ET JOB#: [id number]  cc: Vivien Presto, MD, <Dictator> [place] Caryn Section, MD Minna Merritts, MD  [facility] MD ELECTRONICALLY SIGNED 11/17/2012 14:14

## 2014-05-25 NOTE — H&P (Signed)
PATIENT NAME:  [name], [name] MR#:  [id number] DATE OF BIRTH:  [birth year]  DATE OF ADMISSION:  05/25/2012  PRIMARY CARE PHYSICIAN: Kirstie Peri. Caryn Section, MD  REFERRING PHYSICIAN:   Ponciano Ort, MD   CHIEF COMPLAINT: Left side weakness today.   HISTORY OF PRESENT ILLNESS: The patient is a 44 year old [name] with a past medical history of hypertension, diabetes, hyperlipidemia, presented to the ED with left side weakness today. The patient is alert, awake, oriented, in no acute distress. The patient said she has had a headache for the past 1 month. Recently, the headache is getting worse, so her physician got an MRI of the brain yesterday which showed no acute intracranial pathology. The patient developed left arm weakness and numbness about 2:00 p.m. today, then developed left lower extremity weakness and numbness, but she denies any dysphagia or slurred speech or incontinence. No chest pain, palpitations, orthopnea or nocturnal dyspnea. CAT scan of head is negative.   PAST MEDICAL HISTORY:  1.  Hypertension.  2.  Hyperlipidemia.  3.  History of CVA at the age of 27 and 61 with right side numbness.  4.  Diabetes.  5.  Morbid obesity.   PAST SURGICAL HISTORY:  1.  Status post gastric bypass.  2.  Hysterectomy  3.  Appendectomy.  4.  Left shoulder surgery.   SOCIAL HISTORY: She denies any smoking, drinking or illicit drugs.   FAMILY HISTORY: Father had hypertension, diabetes, a heart attack and stroke, also has colon cancer.   ALLERGIES: No known drug allergies.   HOME MEDICATIONS: Norvasc 10 mg p.o. daily, aspirin 81 mg p.o. daily, benazepril 40 mg p.o. daily, Bupropion XL 300 mg 1 tablet  a day, citalopram 40 mg p.o. daily, clonidine 0.1 mg 2 tablets once a day in the evening, fenofibrate 160 mg p.o. daily, gabapentin 300 mg p.o. t.i.d., metformin 1000 mg p.o. b.i.d., multivitamin 1 tablet once a day, NovoLog sliding scale p.r.n., omeprazole 40 mg p.o. b.i.d., Zocor 10 mg p.o. at  bedtime, temazepam 15 mg once a day at bedtime.    REVIEW OF SYSTEMS:  CONSTITUTIONAL: The patient denies any fever or chills but has a headache, dizziness and weakness.  EYES: No double vision or blurry vision.  ENT: No postnasal drip, slurred speech or dysphagia. No epistaxis.  CARDIOVASCULAR: No chest pain, palpitations, orthopnea or nocturnal dyspnea. No leg edema.  PULMONARY: No cough, sputum, shortness of breath or hemoptysis.  GASTROINTESTINAL: No abdominal pain, nausea, vomiting or diarrhea. No melena or bloody stool.  GENITOURINARY: No dysuria, hematuria or incontinence.  SKIN: No rash or jaundice.  NEUROLOGY: Positive for left-sided weakness and numbness and headache, dizziness, but no seizure, loss of consciousness or syncope.  PSYCHIATRIC: No depression or anxiety.  HEMATOLOGY: No easy bruising or bleeding.  ENDOCRINOLOGY: No polyuria, polydipsia, heat or cold intolerance.   PHYSICAL EXAMINATION: VITAL SIGNS: Temperature 98.6, blood pressure 103/55, pulse 64, respirations 16, O2 oxygen saturation 97% on room air.  GENERAL: The patient is alert, awake, oriented, in no acute distress.  HEENT: Pupils are round, equal and reactive to light and accommodation. Moist oral mucosa, clear oropharynx.  NECK: Supple. No JVD or carotid bruit. No thyromegaly. No thyromegaly.  CARDIOVASCULAR: S1, S2 regular rate and rhythm. No murmurs or gallops.  PULMONARY: Bilateral air entry. No wheezing or rales. No use of accessory muscles to breathe.  ABDOMEN: Soft, obese, bowel sounds present. No distention or tenderness. No organomegaly.  EXTREMITIES: No edema, clubbing or cyanosis. No calf tenderness.  Strong bilateral pedal pulses.  NEUROLOGICAL: A and O x 3. No facial droop. Left power about 4 out of 5 on the left arm and leg and 5 out of 5 on the right side. Sensation intact.   LABORATORY, DIAGNOSTIC AND RADIOLOGIC DATA:  Urinalysis is negative. Glucose 85, BUN 13, creatinine 1.44. Sodium 139,  potassium 4.5, chloride 111, bicarbonate 22. WBC 10.2, hemoglobin 10.8, platelets 270. Troponin less than 0.02.    CAT scan of head: No acute intracranial process.  MRI of brain yesterday: No acute intracranial pathology  IMPRESSION: 1.  Possible cerebrovascular accident.  2.  Acute renal failure.  3.  Hypertension.  4.  Diabetes.  5.  Hyperlipidemia.  6.  Anemia.  7.  Obesity.   PLAN OF TREATMENT: 1.  Possible cerebrovascular accident:  The patient will be admitted to the telemetry floor. We will start neuro checks and get an MRA of the brain and a carotid duplex.  In addition, we will get an echocardiograph. We will give aspirin 325 mg p.o. daily and continue a statin.  2.  Acute renal failure:  We will start normal saline IV and follow up BMP.  3.  Diabetes:  We will start sliding scale, check hemoglobin A1c.  4.  Hypertension:  We will continue the patient's home hypertension medication  5.  Gastrointestinal  and deep vein thrombosis prophylaxis.   I discussed the patient's condition and the plan of treatment with the patient.         TIME SPENT: About 56 minutes.  ____________________________ Demetrios Loll, MD qc:cb D: 05/25/2012 19:56:23 ET T: 05/25/2012 20:06:38 ET JOB#: [id number]  cc: Demetrios Loll, MD, <Dictator> Demetrios Loll MD ELECTRONICALLY SIGNED 05/26/2012 16:01

## 2014-05-25 NOTE — Discharge Summary (Signed)
PATIENT NAME:  [name], [name] MR#:  [id number] DATE OF BIRTH:  1970-10-08  DATE OF ADMISSION:  05/25/2012 DATE OF DISCHARGE:  05/26/2012  PRIMARY CARE PHYSICIAN: Kirstie Peri. Caryn Section, MD   CHIEF COMPLAINT: Headache.   CONSULTATIONS: None.   DISCHARGE DIAGNOSES:  1.  Headache and left-sided weakness likely secondary to migraine, cerebrovascular accident is ruled out.  2.  Hypertension.  3.  Diabetes mellitus, type 2. 4.  Hypertriglyceridemia.  5.  History of previous stroke.   DISCHARGE MEDICATIONS: 1.  Metformin 1 gram p.o. b.i.d.  2.  Fenofibrate 160 mg p.o. daily.  3.  Amlodipine 10 mg p.o. daily.  4.  Benazepril 40 mg p.o. daily.  5.  Celexa 40 mg p.o. daily.  6.  Omeprazole 40 mg p.o. b.i.d.  7.  Aspirin is increased from 81 mg to 325 mg daily. 8.  She was given Fioricet prescription for her migraines.  I gave her 20 tablets of Fioricet.   HOSPITAL COURSE: This is a 44 year old female patient with history of prior CVA who has residual numbness on the right side  came in because of weakness of the left side with some facial numbness. The patient had an MRI done in the MRI suite which did not show any acute intracranial pathology.   She developed left arm weakness, numbness and was admitted to the hospitalist service. The patient sees Dr. [name], who recommended an MRI of the brain for headache, which was negative; but because of weakness in the left side with multiple risk factors of diabetes, hypertension, history of previous strokes, she is admitted for overnight observation.  The patient's MRI did not show any acute changes. CT head is unremarkable. The patient's neurological exam on admission was normal. Her vitals were blood pressure 103/55 on admission.  The patient had an MRI of the brain which did not show any acute pathology. Carotid ultrasound did not show any stroke, and the patient complained of headache which did not improve with even morphine IV; but during my  examination the patient said she has had some photophobia.  Headache is mainly in the back of the head. I thought she might have tension headache versus migraine. The patient was started on Fioricet and will see if that helps. I told the patient to follow up with Dr. Caryn Section to refer her to a neurologist, and the patient said she will do it; but for now we are going to continue her on aspirin higher dose because of history of previous strokes and symptoms of stroke this time, and she needs to continue her statin.  Her lipid levels this time showed LDL is 31, but triglycerides are still high at 348 . She can continue the TriCor.   As a part of  stroke workup, she also had an echocardiogram which showed EF of 50 to 55% with normal LV function. The patient was seen by Physical Therapy who recommended outpatient physical therapy. The patient is discharged home as her headache was the only thing at this time, but her weakness resolved, and the patient's MRI of the brain is negative. Symptoms are consistent with migraine, and she can continue Fioricet; but due to her history of hypertension, we did not give her any Imitrex. The patient is discharged to home.   TIME SPENT:   More than 30 minutes.   ____________________________ Epifanio Lesches, MD sk:cb D: 05/26/2012 21:58:32 ET T: 05/26/2012 22:43:57 ET JOB#: [id number]  cc: Epifanio Lesches, MD, <Dictator> Epifanio Lesches MD ELECTRONICALLY  SIGNED 06/08/2012 19:33

## 2014-05-26 NOTE — Discharge Summary (Signed)
PATIENT NAME:  [name], [name] MR#:  [id number] DATE OF BIRTH:  Jun 04, 1970  DATE OF ADMISSION:  10/12/2013  DATE OF DISCHARGE:  10/13/2013  ADMISSION DIAGNOSIS: Syncope.   DISCHARGE DIAGNOSES: 1. Syncope.  2. History of systolic heart failure which was chronic and stable at this admission.  3. Acute kidney injury on chronic renal failure stage 3.  4. Diabetes.   CONSULTATIONS: None.   LABORATORY DATA: Troponins x 3 were negative. Sodium 144, potassium 4.3, chloride 113, bicarbonate 23, BUN 26, creatinine 1.71 and glucose is 238.   HOSPITAL COURSE: A 44 year old female with a history of strokes, hypertension and diabetes who presented with syncope. For further details, please refer to the H and P.  1. Syncope. I suspect this is related to her hypotension, as she was significantly hypotensive in the ER, which responded to IV fluids. She was not orthostatic in the ER. We also checked orthostatics when she was admitted and she was not orthostatic. Her troponins were negative. Her telemetry was normal. Going back to her old records, it does appear that she has a  cardiomyopathy on echocardiogram December 2014. Her EF at that time was 40%. I will therefore discharge her with a Holter monitor and follow up with cardiology.  2. Systolic heart failure, which is chronic and stable. Her EF is 40%. This was performed by an echocardiogram in December 2014.  3. Acute kidney injury and chronic renal failure stage 3. Her baseline creatinine is 1.2 to 1.6. Her creatinine did improve with IV fluids, holding any nephrotoxic agents at this time as well as metformin due to her low GFR.  4. Diabetes. Her A1c is 7.1, as she reports with holding metformin. She may resume her other outpatient medications.   DISCHARGE MEDICATIONS: 1. Gabapentin 300 mg 3 times a day.  2. Bupropion 300 mg daily.  3. Coreg 3.125 b.i.d.  4. Invokana 300 mg daily.  5. Plavix 75 mg daily.  6. Prevacid 15 mg b.i.d.   7. Lexapro 20 mg  daily.  8. Temazepam 15 mg at bedtime.  9. Lipitor 40 mg at bedtime.  10. Levemir 25 units at bedtime.  11. NovoLog 12 units 3 times a day.   DISCHARGE DIET: Low sodium, ADA diet.   DISCHARGE ACTIVITY: As tolerated.   DISCHARGE FOLLOWUP: The patient will need to follow up with Dr. [name] in 1 week. Again, she will be discharged with a Holter monitor and she will follow up with Dr. Caryn Section in 1 week.  TIME SPENT: Approximately 35 minutes.    ____________________________ Donell Beers. Benjie Karvonen, MD spm:TT D: 10/13/2013 12:02:32 ET T: 10/13/2013 14:02:37 ET JOB#: [id number]  cc: Vernia Teem P. Benjie Karvonen, MD, <Dictator> Minna Merritts, MD Kirstie Peri Caryn Section, MD Donell Beers Tikesha Mort MD ELECTRONICALLY SIGNED 10/13/2013 15:45

## 2014-05-26 NOTE — Discharge Summary (Signed)
PATIENT NAME:  [name], [name] MR#:  [id number] DATE OF BIRTH:  01-25-1971  DATE OF ADMISSION:  01/28/2013 DATE OF DISCHARGE:  01/31/2013  ADMITTING DIAGNOSIS:  Stroke.    DISCHARGE DIAGNOSES:  1.  Stroke with left-sided weakness.  2.  Hypertension.  3.  Supraventricular tachycardia. 4.  Acute renal failure, result of IV fluids. 5.  Diabetes mellitus with hemoglobin A1c 8.5. Not on insulin therapy.  6.  Hyperlipidemia.  7.  Hypertriglyceridemia.  8.  History of migraine headaches.  9.  Prior cerebrovascular accident.      DISCHARGE CONDITION: Stable.   DISCHARGE MEDICATIONS:  The patient is to resume: 1.  Metformin 1 g twice daily.  2.  Benazepril 40 p.o. daily.  3.  Citalopram 40 mg p.o. daily.  4.  Gabapentin 300 mg p.o. 3 times daily.  5.  Bupropion extended release 300 mg p.o. daily.  6.  NovoLog sliding scale.  7.  TriCor 145 mg p.o. daily.  8.  Carvedilol 3.125 mg p.o. twice daily.  9.  Lansoprazole 30 mg p.o. twice daily.  10.  Multivitamins once daily.  11.  Invokana 300 mg p.o. daily.  12.  Acetaminophen hydrocodone 325/5 mg one tablet every six hours as needed.  13.  Insulin detemir, which is Levemir, 25 units subcutaneously at bedtime.  14.  Atorvastatin 40 mg p.o. daily.  15.  Plavix 75 mg p.o. daily.  16.  Senna 1 tablet twice daily as needed.  17.  Zofran ODT 4 mg up to 6 times daily as needed.   HOME OXYGEN: None.  HOME HEALTH: With physical therapy, occupational therapy, as well as nurse, as well as nurse's aide.    DIET: Two g salt, low fat, low cholesterol, carbohydrate-controlled diet. Regular consistency with thin liquids and  ground/chopped meats and aspiration precautions.   ACTIVITY LIMITATIONS: As tolerated.   FOLLOWUP: Appointment with Dr. Caryn Section in 2 days after discharge. Dr. Manuella Ghazi, neurology, in 1 week after discharge.    REFERRAL: To home health, physical therapy, occupational therapy, as well as speech therapy.   CONSULTANTS: Care  management, social work, physical therapy, speech therapy, Dr. Irish Elders, neurologist.   RADIOLOGIC STUDIES: Chest, portable, single view, on the 27th of December 2014, showed no acute abnormalities.  CT scan of head without contrast, the 27th of December 2014, revealed mild small vessel disease. No demonstrable intracranial mass, hemorrhage, or acute infarct. Mild right-sided maxillary sinus disease was noted.   MRI of brain without contrast, the 27th of December 2014, revealed acute infarct in the right parietal white matter. Extensive chronic microvascular ischemic changes were also noted.   Ultrasound of abdomen, limited survey, 29th of December 2014, revealed fatty infiltration of the liver. No other abnormality was seen in the right upper quadrant of the abdomen.   Carotid ultrasound was not performed as it was recently done.   Echocardiogram, the 28th of December 2014, showed suboptimal apical windows, suboptimal subcostal windows as well as technically difficult study due to poor echo windows. Left ventricular ejection fraction by visual estimation was 40% to 45%.   moderately to severely decreased global left ventricular systolic function, basal anteroseptal segment as well as mid anteroseptal segment were abnormal.   Myoview stress test was not performed as it was recently done in October 2014.   HOSPITAL COURSE: The patient is a 44 year old [name], with history of diabetes, poorly controlled, with hemoglobin A1c 8.5, who presents to the hospital with complaints of left-sided weakness. Please refer to  Dr. Laurin Coder Gutierrez's admission note done on the 27th of December 2014.  On arrival to the Emergency Room, the patient's vitals: Blood pressure was 108/54, pulse was 64, respiratory rate was 17. Her O2 sats were 98% on room air. Temperature initially was 97.6.   Physical exam revealed left-sided weakness.   The patient's lab data done in the Emergency Room on the day of  admission revealed elevation of creatinine to 1.45% and glucose of 228; otherwise, BMP was unremarkable. The patient's liver enzymes revealed elevation of AST to 42 and alkaline phosphatase 138. Cardiac enzymes x3 were within normal limits. CBC was within normal limits. Urinalysis revealed 28 white blood cells, 4 red blood cells, trace leukocyte esterase;  however, urine cultures were negative. Mixed organisms were only noted. Results suggestive of contamination.   EKG shows normal sinus rhythm at 64 beats per minute, normal axis. No acute ST-T changes were noted.   The patient's chest x-ray was unremarkable as well as her head CT.   The patient was admitted to the hospital for further evaluation for possible stroke. She underwent an MRI of her brain which did reveal acute stroke in the right parietal white matter.   The patient was evaluated by speech therapist who recommended mechanical soft diet and aspiration precautions.   She was also seen by a physical therapist who recommended home health physical therapy.   The patient's aspirin was changed by neurologist, Dr. Irish Elders, to Plavix. Her lipid panel was checked and the patient was noted to have marked lipid abnormalities more signifying poor diabetes control.   The patient's LDL was found to be not calculable due to markedly elevated triglyceride levels. The patient's triglyceride level was noted to be 706. Total cholesterol was 176. HDL was 71. Hemoglobin A1c was checked and was found to be 8.5.   It was felt that the patient should continue her medical therapy for hypertriglyceridemia; however, the patient's prior statin was changed to Lipitor.   The patient was also initiated on intense diabetes management with insulin. Levemir was initiated. Diabetic education was obtained as well as dietary education. The patient was taught to inject insulin.   The patient was evaluated by neurologist, Dr. Irish Elders, who followed her along.   Dr.  Irish Elders recommended, very strict hypertension, diabetes, as well as hyperlipidemia control and follow up with Dr. Manuella Ghazi as outpatient.  She is to continue home health, physical, occupational therapy as well as speech therapist followup and treatment and Plavix for secondary intervention.   In regards to hypertension, the patient's blood pressure was satisfactorily controlled. She is to continue current management. We did not advance her blood pressure medications since we wanted her blood pressure to remain around 150s to 160s range. The patient is to resume her oral blood pressure medications according to her primary care physician's recommendations, but for now until at least 7 days after acute stroke, the patient was advised to continue lower doses of her medication.   On the day of discharge, 20th of December 2014, the patient's vitals were stable with temperature of 98.2, pulse was 78, respiratory rate 18, blood pressure 156/94. Saturation was 98% on room air at rest.   In regards to diabetes, as mentioned above, the patient was initiated on insulin therapy. She will be getting also lifestyle education as outpatient.   The patient was noted to have a short run of SVT while in the hospital and was advised to return back with her beta blockers. She was restarted  on her usual dose of Coreg.   For cardiomyopathy, she is to continue ACE inhibitors.   For hyperlipidemia as well as hypertriglyceridemia, we hope that with better diabetes control, her hypertriglyceridemia will be also better controlled. However, the patient is to follow up with her primary care physician, Dr. Caryn Section, and make decisions about adding additional medications for hypertriglyceridemia, hyperlipidemia control.   For migraine headaches, the patient is to continue to follow up with Dr. Manuella Ghazi.  Of note, since it is second stroke over the past few years, we took hypercoagulable workup testing. The patient is to follow up with Dr.  Manuella Ghazi and have labs checked at that time and inform about any changes and abnormalities if they exist.   CONDITION ON DISCHARGE: The patient is being discharged in stable condition with above-mentioned medications and followup.   TIME SPENT: 40 minutes.    ____________________________ Theodoro Grist, MD rv:np D: 01/31/2013 17:55:26 ET T: 01/31/2013 21:35:42 ET JOB#: [id number]  cc: Theodoro Grist, MD, <Dictator> Hemang K. Manuella Ghazi, MD Kirstie Peri Caryn Section, MD  Theodoro Grist MD ELECTRONICALLY SIGNED 02/14/2013 13:10

## 2014-05-26 NOTE — Discharge Summary (Signed)
PATIENT NAME:  [name], [name] MR#:  [id number] DATE OF BIRTH:  06-17-1970  DATE OF ADMISSION:  12/29/2013 DATE OF DISCHARGE:  12/30/2013  ADMITTING DIAGNOSIS: Altered mental status, decrease in responsiveness.   DISCHARGE DIAGNOSES: 1. Acute encephalopathy due to hypoglycemia, now mental status back to normal.  2. Hypoglycemia, Possibly due to poor p.o. intake, as well as concurrent insulin therapy.  3. Possible systemic inflammatory response syndrome without any evidence of infection. Cultures negative.  4. Hypothyroidism.  5. Acute kidney injury on chronic kidney disease, needs outpatient followup.  6. Hypomagnesemia.  7. History of cerebrovascular accident in the past as well as transient ischemic attacks.  8. Depression.  9. Hyperlipidemia.  10. Status post aneurysm repair in 2014. Unable tell us what type of aneurysm.   CONSULTANTS: None.   LABORATORY DATA: Admitting glucose 64, BUN 27, creatinine 1.81, sodium 139, potassium 3.9, chloride 108, CO2 of 26, magnesium 1.7, phosphorus 2.4, hemoglobin A1c is 7.8. LFTs were normal. Troponin less than 0.02. Free thyroxine 0.75 and TSH 6.69. Urinalysis: Toxic urine drug screen positive for benzodiazepines. Admitting WBC count 14.7, hemoglobin 13.9, platelet count 292,000. WBC on discharge was 12.1. Blood cultures: No growth. Urinalysis Nitrites negative, leukocytes negative.   DIAGNOSTIC DATA: Chest x-ray shows no active disease. CT of the head without contrast showed no acute intracranial abnormality, patchy chronic macrovascular white matter changes.   HOSPITAL COURSE: Please refer to H and P done by the admitting physician. The patient is a 44 year old, white female with a history of insulin-requiring diabetes, chronic kidney disease and multiple other medical problems, who was brought in with unresponsiveness. In the ER, the patient was noted to have severe hypoglycemia with a blood glucose of 30. She was also noticed to be hypothermic and  hypotensive. There was concern maybe she had some sepsis syndrome. The patient was given IV fluids and empiric antibiotics. Her insulin was held. She was placed on dextrose-containing fluids with her blood glucose normalizing. By the time I saw her, she was awake and walking around without any complaints. At this time, I strongly recommended the patient monitor her blood sugars closely. She is recommended to hold Invokana and metformin. She was placed on a lower dose of Levemir. The patient will need outpatient followup for her blood sugars. She is currently stable and stable for discharge.   DISCHARGE MEDICATIONS: Gabapentin 300 one tablet p.o. 3 times a day, bupropion 300 one 1 tab p.o. daily, temazepam 15 mg at bedtime, carvedilol 6.25 one tab p.o. b.i.d., enalapril 5 daily, Protonix 40 daily, cyclobenzaprine 10 mg 1 tablet p.o. 3 times a day as needed, Lexapro 20 daily, aspirin 325 p.o. daily, NovoLog sliding scale 3 units 3 times a day, Levemir 12 units at bedtime, levothyroxine 25 mcg daily, magnesium oxide 400 mg 1 tab p.o. b.i.d.   DISCHARGE DIET: Low-sodium, low-fat, low-cholesterol, carbohydrate-controlled diet.   ACTIVITY: As tolerated.   FOLLOWUP: With primary M.D. in 1 to 2 weeks. Follow with [facility] in 2 to 4 weeks. Check blood glucose 3 times a day, keep a log of blood sugars to take to primary MD to review.  TIME SPENT:  35 minutes.     ____________________________ Lafonda Mosses Posey Pronto, MD shp:TT D: 12/31/2013 12:38:13 ET T: 12/31/2013 19:57:10 ET JOB#: [id number]  cc: Kayleen Alig H. Posey Pronto, MD, <Dictator> Alric Seton MD ELECTRONICALLY SIGNED 01/06/2014 8:37

## 2014-05-26 NOTE — Consult Note (Signed)
PATIENT NAME:  [name], WEARY MR#:  [id number] DATE OF BIRTH:  1970/11/16  DATE OF CONSULTATION:  01/29/2013  REFERRING PHYSICIAN:   CONSULTING PHYSICIAN:  Leotis Pain, MD  REASON FOR CONSULTATION: Headache and left-sided weakness.  HISTORY OF PRESENT ILLNESS:  This is a 44 year old female with past medical history of stroke in 2009 at the age of 15, treated in [facility]; history of chronic headaches, hypertension, diabetes, hyperlipidemia, who woke up with numbness in the left upper extremity, at which point she thought she was sleeping on the left upper extremity. When she tried to stand up, she noted she was weak in the left upper as well as left lower extremity, at which time she came to the hospital. The patient follows up with Dr. Manuella Ghazi as an outpatient for chronic headaches. There was recent suspicion of a possible Cadasil diagnosis, at which point Dr. Manuella Ghazi sent some blood work. 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 Currently, the patient states symptoms have improved, left lower extremity is getting stronger. At baseline, she is on aspirin 325 a day.   REVIEW OF SYSTEMS  CONSTITUTIONAL:  No fever. No chills. No double vision.  EAR, NOSE, THROAT: No postnasal drip, no difficulty swallowing.  CARDIOVASCULAR: No chest pain. No shortness of breath.  PULMONARY: No cough. No wheezing or shortness of breath.  GASTROINTESTINAL: No abdominal pain or constipation.  GENITOURINARY: No dysuria, hematuria or change in frequency.  SKIN: No rashes.  NEUROLOGIC: She has weakness of the left side.  MUSCULOSKELETAL: No neck pain. No joint pain.  PSYCHIATRIC: No anxiety or depression.   PAST MEDICAL HISTORY: Hypertension, diabetes, hyperlipidemia, morbid obesity, migraine headaches, chronic kidney disease and history of stroke about 5 years ago.   PAST SURGICAL HISTORY: Gastric bypass, hysterectomy, appendectomy, left shoulder  surgery.   SOCIAL HISTORY: Does not smoke. No drug use. Currently lives with her fiance and her son and currently off work because of her headaches.   FAMILY HISTORY: Father had a history of hypertension, diabetes and MI as well as stroke.   CURRENT MEDICATIONS: Include TriCor, simvastatin, NovoLog, multivitamin, metformin, pantoprazole, Invokana, gabapentin, clonidine, citalopram, Coreg, buspirone, benazepril, aspirin 325, amlodipine.   IMAGING: CAT scan did not find any acute intracranial abnormalities status post MRI of the brain that showed right parietal small infarct about 1 cm in size that is hitting her motor cortex which has contributed to the left side weakness.   LABORATORY DATA: Workup includes cholesterol 176, triglycerides severely elevated at 706. Hemoglobin A1c is 8.5. White blood cell count is 7.6, hemoglobin is 13, hematocrit 39.1. Creatinine 1.45, BUN is 15.   PHYSICAL EXAMINATION: VITAL SIGNS: Include a temperature of 98.6, pulse 63, respirations 16, blood pressure 148/89, pulse ox 90% on room air.   NEUROLOGICAL EXAMINATION:  The patient is alert, awake, oriented to time, place, location, why she is in the hospital, able to tell how many quarters are in $1.50. Orientation intact. Speech appears to be fluent. Cognition appears to be intact. Short-term memory intact. Cranial nerve examination: Pupils 3 mm to 2 mm, reactive bilaterally. Facial sensation intact. Facial motor is intact. Uvula elevates symmetrically. Tongue is midline. Shoulder shrug intact. Motor strength examination: The patient has a drift of left upper extremity which is 4 out of 5, right upper extremity is 5 out of 5, left lower extremity is 4- out of 5 proximally and 4 out of 5 distally, right  lower extremity is 5 out of 5. Coordination: Finger-to-nose intact.  Sensation appears to be intact.  Gait could not be assessed. Reflexes are symmetrical throughout.   IMPRESSION: This is 44 year old female with past  medical history of hypertension, diabetes, hyperlipidemia, previous stroke about 5 years ago.  She sees Dr. Manuella Ghazi as outpatient. Presenting with left-sided numbness and weakness. Left-sided numbness has resolved but the weakness is still present left upper extremity drift and 4- out of 5 proximally and 4 out of 5 distally with lower extremity weakness. On evaluation, her hemoglobin A1c is elevated. She has hyperlipidemia.   PLAN: Diabetic management, blood pressure control. Continue statin. Switch her from aspirin 325 which she uses at home to Plavix 75 daily since she has failed aspirin therapy. I agree with sending out hypercoagulable panel which was sent out. She would need to follow up with Dr. Manuella Ghazi as outpatient, who sees her for chronic headaches as there was a question of possibly Cadasil diagnosis. This is a white matter disease with demyelination, presenting with chronic headaches. I suspect he sent a notch 3 mutation for laboratory workup but I cannot access the results of it. Agree with 2-D echo.  She recently had a carotid workup done, would not further pursue it. Physical therapy, occupational therapy and speech therapy.   Thank you, it was a pleasure seeing this patient.   ____________________________ Leotis Pain, MD yz:cs D: 01/29/2013 12:31:00 ET T: 01/29/2013 15:15:02 ET JOB#: [id number]  cc: Leotis Pain, MD, <Dictator> Leotis Pain MD ELECTRONICALLY SIGNED 02/03/2013 14:50

## 2014-05-26 NOTE — H&P (Signed)
PATIENT NAME:  [name], [name] MR#:  [id number] DATE OF BIRTH:  September 04, 1970  DATE OF ADMISSION:  12/29/2013  PRIMARY CARE PHYSICIAN:  [name], MD     REFERRING EMERGENCY ROOM PHYSICIAN:  Joanne Gavel, MD   CHIEF COMPLAINT: Unresponsiveness.   HISTORY OF PRESENT ILLNESS: The patient is a 44 year old female with past medical history of insulin-requiring diabetes mellitus, chronic kidney disease stage III, and multiple other medical problems. Once again fine until today this morning. The patient was found unresponsive by her son and he called his dad. Dad immediately came home and called EMS. This incident happened at around 10:00 a.m. EMS checked her Accu-Chek, glucose was at around 30. D50 was given and it went up to 240; then dropped down to 100 again. Her altered mental status significantly improved when the blood sugar went up. The patient is supposed to take a long-acting insulin sliding scale but she is reporting that she takes NovoLog on as needed basis, not taking any long-acting insulin; husband also reporting the same thing, she takes insulin as needed basis.   Patient was brought into the ED, the patient was found to be hypothermic, hypotensive, somewhat lethargic. Rectal temperature initially was at 95.5; blood pressure initially was at 125/72, but dropped down to 98/59.   The patient was started on IV fluids. She was placed on [facility], and cultures were obtained. She was started on broad-spectrum antibiotics. The patient's baseline creatinine is at 1.2, and today it is at 1.81. Magnesium is at 1.7. TSH is elevated at 6.69, and the FT4 is at 0.75. Urine drug screen is positive. The patient denies any fever or sick contacts. As her Accu-Cheks were persistently low, patient is placed on D5 normal saline at 100 mL/h, to maintain her blood sugars. The patient's husband and son are at bedside. The patient had a stress dose of steroids 100 mg IV in the ED.   During my examination,  the patient is arousable and answering questions appropriately, denies any similar complaints like low blood sugars and hypothermia in the past; denies any chest pain or shortness of breath, just complaining of generalized weakness.   PAST MEDICAL HISTORY: Insulin-requiring diabetes mellitus, chronic kidney disease stage III, baseline creatinine is at 1.2, chronic history of depression, hyperlipidemia, hypertension, stroke x 2, in the past with no residual neurological deficits.   PAST SURGICAL HISTORY: She had some aneurysmal repair recently done in January 2014, but she could not recall the name of aneurysm.   ALLERGIES: No known drug allergies.   PSYCHOSOCIAL HISTORY: Lives at home with husband and son. No history of smoking, alcohol or illicit drug usage.   FAMILY HISTORY: Hypertension, diabetes, and coronary artery disease runs in her family; also strokes run in her family.   HOME MEDICATIONS: Bupropion 300 mg 1 tablet p.o. once daily, aspirin 81 mg p.o. once daily, amlodipine 10 mg p.o. once daily, temazepam 15 mg 1 capsule p.o. once daily, pantoprazole 40 mg p.o. once daily, NovoLog 12 units subcutaneously 3 times a day, metformin 1000 mg 1 tablet p.o. 2 times a day, Levemir 25 units subcutaneously at bedtime, Invokana 300 mg 1 tablet p.o. once daily for diabetes mellitus; hydrochlorothiazide 25 mg 1 tablet p.o. once daily, gabapentin 300 mg 1 capsule p.o. 3 times a day, citalopram 20 mg p.o. once daily, enalapril 5 mg once daily, cyclobenzaprine 10 mg 1 tablet p.o. 3 times a day, clonidine 0.1 mg tablet p.o. b.i.d. Coreg 6.25 mg 1  tablet p.o. 2 times a day, bupropion 300 mg 1 tablet p.o. once daily.   REVIEW OF SYSTEMS: CONSTITUTIONAL: Complaining of fatigue and weakness, denies any fevers.  EYES: Denies blurry vision, double vision, glaucoma.  ENT: Denies epistaxis, discharge.  RESPIRATORY: Denies cough, COPD.  CARDIOVASCULAR: No chest pain, palpitations; the patient had 1 episode of  syncope today.  GASTROINTESTINAL: Denies nausea, vomiting, diarrhea, abdominal pain, hematemesis.  GENITOURINARY: No dysuria, hematuria.  GYNECOLOGIC: Denies breast mass or vaginal discharge.  ENDOCRINE: Denies polyuria, nocturia. Has chronic history of insulin-requiring diabetes mellitus, denies any thyroid problems.  HEMATOLOGIC AND LYMPHATIC: No anemia, easy bruising, bleeding.  INTEGUMENTARY: No acne, rash, lesions.  MUSCULOSKELETAL: No joint pain in the neck and back. Denies any gout.  NEUROLOGIC: Has 2 episodes of stroke in the past, and multiple TIAs, denies any history of seizures.  PSYCHIATRIC: No ADD, but has chronic history of depression, denies any suicidal or homicidal ideation.   PHYSICAL EXAMINATION: VITAL SIGNS: Temperature 96.2, currently in [facility], pulse 72, respirations 18, blood pressure 97/60, pulse oximetry is 100% on room air.  GENERAL APPEARANCE: Not in any acute distress, obese and frail looking female.    HEENT: Normocephalic, atraumatic. Pupils are equally reacting to light and accommodation. No scleral icterus. No conjunctival injection. No sinus tenderness, dry mucous membranes.  NECK: Supple. No JVD. No thyromegaly. Range of motion is intact.  LUNGS: Clear to auscultation bilaterally. No accessory muscle use and no anterior chest wall tenderness on palpation.  CARDIAC: S1, S2 normal, regular rate and rhythm, no murmurs.  GASTROINTESTINAL: Soft, obese, bowel sounds are positive in all 4 quadrants, nontender, nondistended, no hepatosplenomegaly, no masses felt.  NEUROLOGIC: Lethargic but arousable and answering all questions appropriately, oriented x 3, following verbal commands, motor and sensory are intact, reflexes are 2+.  EXTREMITIES: No edema. No cyanosis. No clubbing.  SKIN: Warm to touch, normal turgor, no rashes, no lesions.  MUSCULOSKELETAL: No joint effusion, tenderness, or edema.    PSYCHIATRIC: Flat mood and affect.   DIAGNOSTIC DATA: Glucose 64,  BUN 27, creatinine 1.81, baseline seemed to be at 1.20, sodium and potassium are normal, chloride is 108, CO2 of 26, GFR 32, anion gap is 5, serum osmolality normal, phosphorus 2.4, magnesium is 1.7, LFTs are normal, troponin less than 0.02, free TSH is elevated at 6.69, FT4 is 0.75, urine drug screen is positive for MDMA, and benzodiazepines, WBC 14.7, hemoglobin and hematocrit are normal, platelets are normal, PT-INR are normal.  Urinalysis nitrites and leukocyte esterase are negative, protein negative, blood negative, glucose is greater than 500, lactic acid is normal, 2.0; CAT scan of the head without contrast, no acute intracranial abnormality, patchy chronic microvascular white matter ischemic changes; chest x-ray, portable, no active disease; a 12-lead EKG, normal sinus rhythm at 65 beats per minute, normal PR and QRS intervals, no acute Kendra-T wave changes.   ASSESSMENT AND PLAN: A 44 year old [name] brought into the ED after she was found unresponsive and unarousable by her son this morning at around 10:00 a.m. The patient's Accu-Chek was at 30, as checked by the EMS. The patient was given D50 and brought into the emergency department.  1. Altered mental status/syncope. Probably from hyperglycemia and systemic inflammatory response syndrome.  2.  Persistent hypoglycemia. Probably from noncompliance with insulin, questionable overdose with insulin with a complaint of sepsis and acute kidney injury.  We are holding her home dose insulin. We will provide her IV fluids with dextrose, monitor Accu-Cheks closely. The patient  will be admitted to intensive care unit. We will check hemoglobin A1c. We will put a consult to inpatient diabetic clinic.  3.  Systemic inflammatory response syndrome with hypotension, hypothermia. Source is unknown at this time. Chest x-ray looks fine. Urine is with no infection,  it can be septicemia, pan cultures are obtained, stress dose steroids were given with Solu-Cortef.  We will continue Solu-Cortef and broad-spectrum antibiotics. Zosyn, Levaquin and vancomycin will be given.  4.  Probably new onset hyperthyroidism. TSH is elevated, and FT4 is normal. We will start her on Synthroid. As the patient is responding we will give her p.o. Synthroid, and we will check free T3, as well.  5.  Acute kidney injury with chronic kidney disease. Renal function, baseline creatinine is at 1.2. We will provide her aggressive hydration with IV fluids with close monitoring of urine output.  6.  Hypomagnesemia. We will replace magnesium.  7.  History of strokes in the past with transient ischemic attacks. I will resume her aspirin and statin.  8.  Currently, patient is hypotensive. We will hold off on her blood pressure medications as well as benzodiazepines in view of altered mental status.   Diagnosis and plan of care was discussed in detail with the patient and her husband at bedside. They both expressed understanding of the plan. The patient will be provided with gastrointestinal prophylaxis with Pepcid and deep vein thrombosis prophylaxis with Lovenox subcutaneous.   TIME SPENT:  Was 50 minutes.    ____________________________ Nicholes Mango, MD ag:nt D: 12/29/2013 15:54:39 ET T: 12/29/2013 17:18:57 ET JOB#: [id number]  cc: Nicholes Mango, MD, <Dictator> Kirstie Peri. Caryn Section, MD Nicholes Mango MD ELECTRONICALLY SIGNED 01/18/2014 16:44

## 2014-05-26 NOTE — H&P (Signed)
PATIENT NAME:  [name], [name] MR#:  [id number] DATE OF BIRTH:  08/11/1970  DATE OF ADMISSION:  10/12/2013  REFERRING PHYSICIAN: Staff.  PRIMARY CARE PHYSICIAN:  Dr. Sherrie Mustache.    CHIEF COMPLAINT: She is complaining of weakness and passing out.    HISTORY OF PRESENT ILLNESS: A 44 year old [name] with a history of chronic kidney disease, hypertension, type 2 diabetes, insulin requiring, as well as CVA x 2 without residual neurological deficit presenting after a syncopal episode. She describes a 1 to 2 day duration of lightheadedness, particularly when going from a sitting to standing position, without any further symptomatology including fevers or chills.  Today, she had an episode where she actually passed out.  No preceding events. No palpitations, chest pain, or shortness of breath. She has an unknown duration of loss of consciousness, however, determined it must have been short as she was on the telephone when it occurred.  Had no postictal confusion, tongue biting, or loss of bowel or bladder function. She thus presented to the hospital for further workup and evaluation. She was noted to be hypotensive in the ER. Original systolic blood pressure was in the 80s; however, it improved after receiving 2 liters normal saline. Otherwise, denies further symptomatology.  REVIEW OF SYSTEMS:   CONSTITUTIONAL: Positive for fatigue, weakness. Denies fevers, chills.  EYES: Denies blurry vision, double vision, eye pain.   HEENT: Denies tinnitus, ear pain, hearing loss.  RESPIRATORY: Denies cough, wheeze, shortness of breath.  CARDIOVASCULAR: Denies chest pain, palpitations, edema.  GASTROINTESTINAL: Denies nausea, vomiting, diarrhea, abdominal pain.  GENITOURINARY: Denies dysuria, hematuria.  ENDOCRINE: Denies nocturia or thyroid problems. HEMATOLOGIC AND LYMPHATIC: Denies easy bruising or bleeding. SKIN: Denies rashes or lesions.  MUSCULOSKELETAL: Denies pain in neck, back, shoulder, knees, hips,  or arthritic symptoms.  NEUROLOGIC: Denies paralysis, paresthesias.  PSYCHIATRIC: Denies any depressive symptoms.  Otherwise, full review of systems performed and is negative.   PAST MEDICAL HISTORY: Depression not otherwise specified, chronic kidney disease with baseline creatinine of about 1.2, hypertension, hyperlipidemia, CVA x 2 without residual neurological deficit, type 2 diabetes, insulin requiring.   SOCIAL HISTORY: Remote tobacco use. Denies any alcohol or drug use.   FAMILY HISTORY: Positive for hypertension, diabetes, coronary artery as well as CVA.   ALLERGIES: No known drug allergies.   HOME MEDICATIONS: Include benazepril 40 mg p.o. daily, enalapril 5 mg p.o. daily, clonidine 0.2 mg p.o. at bedtime, gabapentin 300 mg p.o. 3 times daily, Lexapro 20 mg p.o. daily, Invokana 300 mg daily, Levemir 25 units subcutaneously daily, metformin 1000 mg p.o. b.i.d., NovoLog subcutaneous 12 units 3 times daily, Lipitor 40 mg p.o. at bedtime, Plavix 75 mg p.o. daily, temazepam 50 mg p.o. at bedtime, Coreg 3.125 mg p.o. b.i.d., hydrochlorothiazide 25 mg p.o. daily, Prevacid 15 mg p.o. b.i.d., bupropion 300 mg p.o. daily.    PHYSICAL EXAMINATION:  VITAL SIGNS: On arrival temperature 98.5, heart rate 116, respirations 18, blood pressure 86/54. Current blood pressure 111/73, saturating 97% on room air. Weight 83.9 kg. BMI 33.9.  GENERAL: Well-nourished, very pleasant, [name] in no acute distress.  HEAD: Normocephalic, atraumatic.  EYES: Pupils equal, round, reactive to light. Extraocular muscles intact. No scleral icterus. Moist mucous membranes. Dentition intact. No abscess noted.  EARS, NOSE, AND THROAT: Clear, without exudates. No external lesions.  NECK: Supple. No thyromegaly. No nodules. No JVD.  PULMONARY: Clear to auscultation bilaterally without wheezes, rales, or rhonchi.  No use of accessory muscles. Good respiratory rate.  CHEST: Nontender to  palpation.  CARDIOVASCULAR: S1,  S2, regular rate and rhythm.  No murmurs, rubs, or gallops. No edema. Pedal pulses 2+ bilaterally. GASTROINTESTINAL: Soft, nontender, nondistended. No masses. Positive bowel sounds. No hepatosplenomegaly.  MUSCULOSKELETAL: No swelling, clubbing, or edema. Range of motion full in all extremities.  NEUROLOGIC: Cranial nerves II through XII intact. No gross focal neurological deficits. Sensation intact. Reflexes intact.  Pronator drift within normal limits.  Strength 5/5 in all extremities including proximal and distal flexion and extension. SKIN: No ulceration, lesions, rashes, or cyanosis. Skin warm, dry. Turgor intact.  PSYCHIATRIC: Mood and affect blunted.  She is awake, alert, oriented x 3. Insight and judgment intact.   LABORATORY DATA: EKG reveals low voltage otherwise no ST-T abnormalities. No conduction deficits noted.  The remainder of laboratory data: Sodium 133, potassium 4.5, chloride 106, bicarbonate of 15, anion gap of 12, BUN 26, creatinine 1.88, glucose 207. WBC 10.1, hemoglobin 12.5, platelets 263,000. Urinalysis negative for evidence of infection.   ASSESSMENT AND PLAN: A 44 year old [name] with a history of chronic kidney disease, type 2 diabetes, insulin requiring, as well as CVA presenting after syncope.  1.  Syncope.  Admit to telemetry under observational status. Trend cardiac enzymes x 3. There is no evidence of EKG abnormalities at this time.  She is not orthostatic in the Emergency Department though unsure if this was after she had already received a liter of fluids.   2.  Acute kidney injury on chronic kidney disease likely prerenal in etiology.  IV fluid hydration, follow urine output and renal function. 3.  Hypertension.  Given her relative hypotensive state, we will hold off her medications.  When blood pressure improves, would restart her Coreg followed by her ACE inhibitor.  4.  Type 2 diabetes, insulin requiring. We will hold p.o. agents, continue her Levemir  and add insulin sliding scale with q. 6 hours Accu-Cheks.   5.  Venous thromboembolism prophylaxis with heparin subcutaneously.  CODE STATUS: Patient is full code.   TIME SPENT: 45 minutes.    ____________________________ Cletis Athens. Steel Kerney, MD dkh:nr D: 10/12/2013 20:46:16 ET T: 10/12/2013 21:52:05 ET JOB#: [id number]  cc: Cletis Athens. Shandon Matson, MD, <Dictator> Azai Gaffin Synetta Shadow MD ELECTRONICALLY SIGNED 10/13/2013 2:02

## 2014-06-03 ENCOUNTER — Emergency Department: Payer: BC Managed Care – PPO

## 2014-06-03 ENCOUNTER — Other Ambulatory Visit: Payer: Self-pay

## 2014-06-03 ENCOUNTER — Encounter: Payer: Self-pay | Admitting: Emergency Medicine

## 2014-06-03 ENCOUNTER — Emergency Department
Admission: EM | Admit: 2014-06-03 | Discharge: 2014-06-03 | Disposition: A | Discharge status: Home / Self Care | Payer: BC Managed Care – PPO | Attending: Internal Medicine | Admitting: Internal Medicine

## 2014-06-03 DIAGNOSIS — Y9389 Activity, other specified: Secondary | ICD-10-CM | POA: Diagnosis not present

## 2014-06-03 DIAGNOSIS — S0990XA Unspecified injury of head, initial encounter: Secondary | ICD-10-CM | POA: Insufficient documentation

## 2014-06-03 DIAGNOSIS — Z7982 Long term (current) use of aspirin: Secondary | ICD-10-CM | POA: Diagnosis not present

## 2014-06-03 DIAGNOSIS — S299XXA Unspecified injury of thorax, initial encounter: Secondary | ICD-10-CM | POA: Diagnosis not present

## 2014-06-03 DIAGNOSIS — R52 Pain, unspecified: Secondary | ICD-10-CM

## 2014-06-03 DIAGNOSIS — Y998 Other external cause status: Secondary | ICD-10-CM | POA: Insufficient documentation

## 2014-06-03 DIAGNOSIS — Z79899 Other long term (current) drug therapy: Secondary | ICD-10-CM | POA: Insufficient documentation

## 2014-06-03 DIAGNOSIS — Z87891 Personal history of nicotine dependence: Secondary | ICD-10-CM | POA: Diagnosis not present

## 2014-06-03 DIAGNOSIS — Y9222 Religious institution as the place of occurrence of the external cause: Secondary | ICD-10-CM | POA: Diagnosis not present

## 2014-06-03 DIAGNOSIS — M6281 Muscle weakness (generalized): Secondary | ICD-10-CM | POA: Diagnosis present

## 2014-06-03 DIAGNOSIS — R296 Repeated falls: Secondary | ICD-10-CM | POA: Insufficient documentation

## 2014-06-03 DIAGNOSIS — R531 Weakness: Secondary | ICD-10-CM

## 2014-06-03 DIAGNOSIS — W01198A Fall on same level from slipping, tripping and stumbling with subsequent striking against other object, initial encounter: Secondary | ICD-10-CM | POA: Diagnosis not present

## 2014-06-03 DIAGNOSIS — I10 Essential (primary) hypertension: Secondary | ICD-10-CM | POA: Insufficient documentation

## 2014-06-03 DIAGNOSIS — E119 Type 2 diabetes mellitus without complications: Secondary | ICD-10-CM | POA: Insufficient documentation

## 2014-06-03 DIAGNOSIS — Z7902 Long term (current) use of antithrombotics/antiplatelets: Secondary | ICD-10-CM | POA: Insufficient documentation

## 2014-06-03 DIAGNOSIS — S79911A Unspecified injury of right hip, initial encounter: Secondary | ICD-10-CM | POA: Diagnosis not present

## 2014-06-03 DIAGNOSIS — R200 Anesthesia of skin: Secondary | ICD-10-CM | POA: Insufficient documentation

## 2014-06-03 LAB — CBC
HCT: 37.3 % (ref 35.0–47.0)
Hemoglobin: 11.8 g/dL — ABNORMAL LOW (ref 12.0–16.0)
MCH: 24.5 pg — AB (ref 26.0–34.0)
MCHC: 31.6 g/dL — ABNORMAL LOW (ref 32.0–36.0)
MCV: 77.6 fL — ABNORMAL LOW (ref 80.0–100.0)
Platelets: 255 10*3/uL (ref 150–440)
RBC: 4.81 MIL/uL (ref 3.80–5.20)
RDW: 13.8 % (ref 11.5–14.5)
WBC: 12.2 10*3/uL — AB (ref 3.6–11.0)

## 2014-06-03 LAB — BASIC METABOLIC PANEL
ANION GAP: 13 (ref 5–15)
BUN: 15 mg/dL (ref 6–20)
CALCIUM: 9 mg/dL (ref 8.9–10.3)
CHLORIDE: 100 mmol/L — AB (ref 101–111)
CO2: 24 mmol/L (ref 22–32)
Creatinine, Ser: 1.46 mg/dL — ABNORMAL HIGH (ref 0.44–1.00)
GFR calc Af Amer: 49 mL/min — ABNORMAL LOW (ref >60)
GFR calc non Af Amer: 42 mL/min — ABNORMAL LOW (ref >60)
GLUCOSE: 230 mg/dL — AB (ref 65–99)
Potassium: 4.1 mmol/L (ref 3.5–5.1)
SODIUM: 137 mmol/L (ref 135–145)

## 2014-06-03 LAB — URINALYSIS COMPLETE WITH MICROSCOPIC (ARMC ONLY)
Bilirubin Urine: NEGATIVE
Hgb urine dipstick: NEGATIVE
Ketones, ur: NEGATIVE mg/dL
LEUKOCYTES UA: NEGATIVE
Nitrite: NEGATIVE
Protein, ur: NEGATIVE mg/dL
RBC / HPF: NONE SEEN RBC/hpf (ref 0–5)
Specific Gravity, Urine: 1.013 (ref 1.005–1.030)
pH: 5 (ref 5.0–8.0)

## 2014-06-03 LAB — GLUCOSE, CAPILLARY: Glucose-Capillary: 183 mg/dL — ABNORMAL HIGH (ref 70–99)

## 2014-06-03 MED ORDER — SODIUM CHLORIDE 0.9 % IV SOLN
INTRAVENOUS | Status: DC
  Administered 2014-06-03: 16:00:00 via INTRAVENOUS

## 2014-06-03 NOTE — ED Notes (Signed)
Patient c/o right sided weakness. States that she has had several fall at home. Hx CVA's. Last known normal was Thursday.

## 2014-06-03 NOTE — ED Provider Notes (Addendum)
[facility] Emergency Department Provider Note  Next ____________________________________________  Time seen: 6  I have reviewed the triage vital signs and the nursing notes.   HISTORY  Chief Complaint Weakness       HPI [name] is a 44 y.o. female with a chief complaint of weakness and falling.  Onset of symptoms was last night at 3:30 AM. Patient awoke to go to the bathroom she sat on the commode and then became lightheaded and fell she did not have a syncopal episode. She did not lose consciousness. When she fell she fell onto her right side. She hurt her right hip and right rib area. She thinks that she hit her head. She was unable to get up from the bathroom floor. She called her husband. He helped her get up and ambulate back to bed. She went back to sleep but needed to be awakened by her husband to go to church which is unusual. She got up and went to the shower and while she was showering she had a second fall. She did not have a syncopal episode in the shower. When she fell in the shower she fell on her bottom. She then went with her family to church. In church she felt weak her hands and her feet felt numb. The pastor sounded fuzzy when he was speaking. She needed help for walking in church. Her family brought her to the emergency department for further evaluation.     Past Medical History  Diagnosis Date  . Hypertension   . Hyperlipidemia   . Obesity   . Depression   . Anxiety   . IDDM (insulin dependent diabetes mellitus)   . Colon polyp   . GERD (gastroesophageal reflux disease)   . History of left heart catheterization (LHC)     a. cath 08/09/2003: mild LAD plaque, no obs dzs noted; b. 12/18/10 stress test w/o ischemia.   . Worsening headaches   . Slurred speech   . Depression   . CVA (cerebral vascular accident)     a. 06/2008-TEE nl LV fxn; b. 01/2013 - notes indicate short run of SVT at that time  . Multiple sclerosis 2015   . Syncope   . Chronic systolic CHF (congestive heart failure)     a. echo 01/29/2013 EF 40-45%, basal anteroseptal & mid anteroseptal segments abnl; b echo 05/2012 EF 50-55%, mildly dilated LA, nl RVSP  . Orthostatic hypotension   . Aortic arch aneurysm   . Cardiomyopathy   . Acute on chronic renal failure   . MS (mitral stenosis)     Patient Active Problem List   Diagnosis Date Noted  . Depression, major, recurrent, in partial remission 05/09/2014  . Bipolar 1 disorder, depressed 05/09/2014  . Left subclavian artery, anomalous origin 03/06/2014  . Dysphasia 07/17/2013  . Orthostatic hypotension 02/09/2013  . Hyperlipidemia   . Obesity   . Depression   . Anxiety   . Colon polyp   . GERD (gastroesophageal reflux disease)   . Worsening headaches   . Numbness   . Slurred speech   . Weakness   . Body aches 05/20/2011  . Gastric ulcer 04/27/2011  . Family hx of colon cancer 03/20/2011  . LUQ pain 03/19/2011  . Flu 02/13/2011  . URI (upper respiratory infection) 01/26/2011  . Palpitations 01/26/2011  . Neuropathy 01/02/2011  . Chest pain 12/22/2010  . Sinus headache 08/03/2010  . Insomnia 08/03/2010  . Foot pain 05/13/2010  . UTI  04/14/2010  . SINUSITIS - ACUTE-NOS 03/24/2010  . LUMBAR RADICULOPATHY 12/02/2009  . NEVUS, ATYPICAL 09/16/2009  . BARIATRIC SURGERY STATUS 09/16/2009  . DYSURIA 03/11/2009  . Hypersomnia 12/05/2008  . NOCTURIA 12/05/2008  . CONTACT DERMATITIS 09/19/2008  . RINGWORM 08/27/2008  . WEAKNESS 07/26/2008  . NUMBNESS 07/23/2008  . CVA (cerebral vascular accident) 06/21/2008  . Diabetes mellitus type 2 with complications, uncontrolled 05/01/2008  . HYPERLIPIDEMIA 05/01/2008  . OBESITY 05/01/2008  . ANXIETY 05/01/2008  . Depression with anxiety 05/01/2008  . Essential hypertension 05/01/2008    Past Surgical History  Procedure Laterality Date  . Appendectomy    . Right oophorectomy    . Tubal ligation      R tube removed still w/ Left  .  Dilation and curettage of uterus    . Gastric bypass  09/2009  . S/p gastric bypass      2011 [facility]  . Cardiac catheterization      [place] removed      Current Outpatient Rx  Name  Route  Sig  Dispense  Refill  . aspirin EC 325 MG tablet   Oral   Take 325 mg by mouth daily.       3   . atorvastatin (LIPITOR) 40 MG tablet   Oral   Take 40 mg by mouth daily.         Marland Kitchen atorvastatin (LIPITOR) 80 MG tablet   Oral   Take 1 tablet by mouth daily.         Marland Kitchen buPROPion (WELLBUTRIN XL) 300 MG 24 hr tablet   Oral   Take 300 mg by mouth daily.         . Canagliflozin 300 MG TABS   Oral   Take 300 mg by mouth daily.         . carvedilol (COREG) 6.25 MG tablet   Oral   Take 1 tablet (6.25 mg total) by mouth 2 (two) times daily with a meal.         . citalopram (CELEXA) 40 MG tablet   Oral   Take by mouth every morning.         . cloNIDine (CATAPRES) 0.2 MG tablet   Oral   Take 0.2 mg by mouth daily.       5   . clopidogrel (PLAVIX) 75 MG tablet   Oral   Take 75 mg by mouth daily with breakfast.         . cyclobenzaprine (FLEXERIL) 10 MG tablet   Oral   Take 10 mg by mouth 3 (three) times daily as needed for muscle spasms.         . enalapril (VASOTEC) 5 MG tablet   Oral   Take 1 tablet (5 mg total) by mouth daily.         Marland Kitchen escitalopram (LEXAPRO) 20 MG tablet   Oral   Take 20 mg by mouth daily.         Marland Kitchen EXPIRED: gabapentin (NEURONTIN) 300 MG capsule   Oral   Take 1 capsule (300 mg total) by mouth 3 (three) times daily.   270 capsule   3   . gabapentin (NEURONTIN) 300 MG capsule   Oral   Take 1 capsule by mouth 3 (three) times daily.         Marland Kitchen glucose blood (ONE TOUCH ULTRA TEST) test strip   Other   1 each by Other route. Use as instructed  Up to 6 times daily          . hydrochlorothiazide (HYDRODIURIL) 25 MG tablet   Oral   Take 25 mg by mouth daily.          . insulin aspart (NOVOLOG) 100 UNIT/ML  injection   Subcutaneous   Inject 4-15 Units into the skin 3 (three) times daily as needed for high blood sugar. Takes anywhere from 4-15 units as directed per sliding scale         . insulin detemir (LEVEMIR) 100 UNIT/ML injection   Subcutaneous   Inject 25 Units into the skin as needed.          . Insulin Detemir (LEVEMIR) 100 UNIT/ML Pen   Subcutaneous   Inject into the skin as needed.         Marland Kitchen levothyroxine (SYNTHROID, LEVOTHROID) 25 MCG tablet   Oral   Take 25 mcg by mouth daily before breakfast.          . metFORMIN (GLUCOPHAGE) 1000 MG tablet   Oral   Take 1,000 mg by mouth 2 (two) times daily with a meal.          . Multiple Vitamin (MULTIVITAMIN) tablet   Oral   Take 1 tablet by mouth daily.          . pantoprazole (PROTONIX) 40 MG tablet   Oral   Take 80 mg by mouth daily.         . promethazine (PHENERGAN) 25 MG tablet   Oral   Take 25 mg by mouth every 6 (six) hours as needed. For nausea         . QUEtiapine (SEROQUEL XR) 50 MG TB24 24 hr tablet   Oral   Take 2 tablets by mouth at bedtime.         . SODIUM BICARBONATE, ANTACID, PO   Oral   Take 1 tablet by mouth 2 (two) times daily.         . temazepam (RESTORIL) 15 MG capsule   Oral   Take 1 capsule (15 mg total) by mouth at bedtime as needed.   90 capsule   1     Allergies Tramadol  Family History  Problem Relation Age of Onset  . Hypertension Mother   . Kidney disease Father   . Heart disease Father   . Hypertension Father   . Diabetes Father   . Stroke Father   . Colon cancer Father     colon cancer    Social History History  Substance Use Topics  . Smoking status: Former Research scientist (life sciences)  . Smokeless tobacco: Never Used  . Alcohol Use: No    Review of Systems  Constitutional: Negative for fever. Eyes: Negative for visual changes. ENT: Negative for sore throat. Cardiovascular: Negative for chest pain. Respiratory: Negative for shortness of  breath. Gastrointestinal: Negative for abdominal pain, vomiting and diarrhea. Genitourinary: Negative for dysuria. Musculoskeletal: Negative for back pain. Skin: Negative for rash. Neurological: Negative for headaches, focal weakness or numbness.   10-point ROS otherwise negative.  ____________________________________________   PHYSICAL EXAM:  VITAL SIGNS: ED Triage Vitals  Enc Vitals Group     BP 06/03/14 1422 98/63 mmHg     Pulse Rate 06/03/14 1422 95     Resp 06/03/14 1422 18     Temp 06/03/14 1422 98.3 F (36.8 C)     Temp Source 06/03/14 1422 Oral     SpO2 06/03/14 1422 97 %  Weight 06/03/14 1422 178 lb (80.74 kg)     Height 06/03/14 1422 5\' 3"  (1.6 m)     Head Cir --      Peak Flow --      Pain Score 06/03/14 1422 10     Pain Loc --      Pain Edu? --      Excl. in [place]? --      Constitutional: Alert and oriented. Well appearing and in no distress. Eyes: Conjunctivae are normal. PERRL. Normal extraocular movements. ENT   Head: Normocephalic and atraumatic.   Nose: No congestion/rhinnorhea.   Mouth/Throat: Mucous membranes are moist.   Neck: No stridor. Hematological/Lymphatic/Immunilogical: No cervical lymphadenopathy. Cardiovascular: Normal rate, regular rhythm. Normal and symmetric distal pulses are present in all extremities. No murmurs, rubs, or gallops. Respiratory: Normal respiratory effort without tachypnea nor retractions. Breath sounds are clear and equal bilaterally. No wheezes/rales/rhonchi. Gastrointestinal: Soft and nontender. No distention. No abdominal bruits. There is no CVA tenderness. Genitourinary: Deferred Musculoskeletal: Nontender with normal range of motion in all extremities. No joint effusions.  No lower extremity tenderness nor edema. Neurologic:  Normal speech and language. No gross focal neurologic deficits are appreciated. Speech is normal. No gait instability. Skin:  Skin is warm, dry and intact. No rash  noted. Psychiatric: Mood and affect are normal. Speech and behavior are normal. Patient exhibits appropriate insight and judgment.  ____________________________________________   EKG  ED ECG REPORT   Date: 06/03/2014  EKG Time: 1430   Rate: 96  rhythm NSR  Axis: Normal  Intervals:none  ST&T Change: Nonspecific ST-T wave changes, left atrial enlargement, left ventricular hypertrophy, anterior infarct age unknown.   ____________________________________________    RADIOLOGY Chest x-ray negative for any acute pathology Right hip and pelvic x-ray negative for any acute pathology  CT of head; consistent with small vessel disease.  ____________________________________________   PROCEDURES  Procedure(s) performed: None  Critical Care performed: No  ____________________________________________   INITIAL IMPRESSION / ASSESSMENT AND PLAN / ED COURSE  Pertinent labs & imaging results that were available during my care of the patient were reviewed by me and considered in my medical decision making (see chart for details). Impression 44 year old woman with multiple medical problems who has had several falls in the past day complaints of generalized weakness. There is no objective evidence of injury or an acute stroke Laboratory evaluation and radiologic imaging in the emergency department including CT of the head does not show any acute etiology for her weakness and falls. She has not had any loss of consciousness or syncopal episodes. Plan after hydration in the emergency department and cardiac monitoring for several hours which did not show any acute etiology of her symptoms I feel she is stable for discharge home and close follow-up by her PMD. Recommendation is that she increase by mouth fluids continue her present medication and follow up with her PMD tomorrow. Patient is ambulatory in the emergency Department without difficulty or weakness on  discharge.  ____________________________________________   FINAL CLINICAL IMPRESSION(S) / ED DIAGNOSES  Final diagnoses:  None  1. Falls 2. Weakness   Boris Lown, DO 06/03/14 [place], DO 06/03/14 Joen Laura

## 2014-06-06 ENCOUNTER — Other Ambulatory Visit: Payer: Self-pay | Admitting: Neurology

## 2014-06-06 DIAGNOSIS — M6281 Muscle weakness (generalized): Secondary | ICD-10-CM

## 2014-06-06 DIAGNOSIS — R4781 Slurred speech: Secondary | ICD-10-CM

## 2014-06-06 DIAGNOSIS — H538 Other visual disturbances: Secondary | ICD-10-CM

## 2014-06-09 ENCOUNTER — Ambulatory Visit: Payer: BC Managed Care – PPO

## 2014-06-19 ENCOUNTER — Ambulatory Visit
Admission: RE | Admit: 2014-06-19 | Discharge: 2014-06-19 | Disposition: A | Discharge status: Home / Self Care | Payer: BC Managed Care – PPO | Source: Ambulatory Visit | Attending: Neurology | Admitting: Neurology

## 2014-06-19 DIAGNOSIS — H538 Other visual disturbances: Secondary | ICD-10-CM | POA: Insufficient documentation

## 2014-06-19 DIAGNOSIS — E119 Type 2 diabetes mellitus without complications: Secondary | ICD-10-CM | POA: Insufficient documentation

## 2014-06-19 DIAGNOSIS — R4781 Slurred speech: Secondary | ICD-10-CM | POA: Diagnosis present

## 2014-06-19 DIAGNOSIS — I10 Essential (primary) hypertension: Secondary | ICD-10-CM | POA: Diagnosis not present

## 2014-06-19 DIAGNOSIS — M6281 Muscle weakness (generalized): Secondary | ICD-10-CM

## 2014-06-19 MED ORDER — GADOBENATE DIMEGLUMINE 529 MG/ML IV SOLN: 8.0000 mL | Freq: Once | INTRAVENOUS | Status: AC | PRN

## 2014-07-04 ENCOUNTER — Other Ambulatory Visit: Payer: Self-pay | Admitting: Family Medicine

## 2014-07-09 ENCOUNTER — Other Ambulatory Visit: Payer: Self-pay

## 2014-07-09 NOTE — Telephone Encounter (Signed)
Please call in  [facility] [address] [place], [place] [phone]   Randal Buba, CMA  32 minutes ago   (4:46 PM)               Requested Medications     temazepam (RESTORIL) 15 MG capsule    Sig: Take 1 capsule (15 mg total) by mouth at bedtime as needed. for sleep

## 2014-07-10 ENCOUNTER — Encounter: Payer: Self-pay | Admitting: Urology

## 2014-07-10 ENCOUNTER — Ambulatory Visit (INDEPENDENT_AMBULATORY_CARE_PROVIDER_SITE_OTHER): Payer: BC Managed Care – PPO | Admitting: Urology

## 2014-07-10 VITALS — BP 164/81 | HR 73 | Ht 63.0 in | Wt 182.6 lb

## 2014-07-10 DIAGNOSIS — R339 Retention of urine, unspecified: Secondary | ICD-10-CM

## 2014-07-10 DIAGNOSIS — R32 Unspecified urinary incontinence: Secondary | ICD-10-CM

## 2014-07-10 LAB — MICROSCOPIC EXAMINATION

## 2014-07-10 LAB — URINALYSIS, COMPLETE
BILIRUBIN UA: NEGATIVE
Ketones, UA: NEGATIVE
NITRITE UA: NEGATIVE
PH UA: 5.5 (ref 5.0–7.5)
Protein, UA: NEGATIVE
RBC, UA: NEGATIVE
SPEC GRAV UA: 1.01 (ref 1.005–1.030)
UUROB: 0.2 mg/dL (ref 0.2–1.0)

## 2014-07-10 LAB — BLADDER SCAN AMB NON-IMAGING: Scan Result: 376

## 2014-07-10 MED ORDER — TEMAZEPAM 15 MG PO CAPS: 15.0000 mg | ORAL_CAPSULE | Freq: Every evening | ORAL | Status: DC | PRN

## 2014-07-10 NOTE — Telephone Encounter (Signed)
Rx phoned into pharmacy.

## 2014-07-10 NOTE — Progress Notes (Signed)
07/10/2014 11:05 AM   [place] [birth year] [id number]  Referring provider: [name], MD [address] [place], [place] [address]  Chief Complaint  Patient presents with  . Urinary Incontinence    2 week follow up on urge incontinence    HPI: Mrs. [name] is a 44 y/o white female with MS who presented to [place] office two weeks ago for incontinence.  At that visit, she was experiencing incontinence with no control that started three months ago.  Her leakage is so severe that she has to wear a pad at night for enuresis.    She was given Vesicare 5 mg (2 weeks of samples) by Dr. Erlene Quan to see if her urgency and incontinence would improve to a tolerable level.  She did not notice any change.  Her PVR increased from 111 cc to 376 cc.  She ran out of the Vesicare samples 2 days ago. She was found to have a +UCx at her last visit and was given Cipro 500 mg for three days. She is currently not experiencing any dysuria, gross hematuria and/or suprapubic pain.  She has no associated fevers, chills, nausea and/or vomiting.   Previous Hx: She's been a long-standing diabetic since the age of 44 and at times her blood sugar was poorly controlled. She does have a history of peripheral neuropathy, CKDIII and other sequela of her long-standing diabetes. More recently, she reports that her blood sugar control has been more reasonable, per patient most recent hemoglobin A1c 6.9.  She does have baseline urinary urgency but over the past 3 months, started to experience fairly profound urge incontinence. She reports that she will feel the sensation to need to urinate and then immediately go in her pad, relatively large volumes. She is going through 5 pads a day which are saturated and also wearing a diaper at night due to enuresis. Prior to 3 months ago, she denies any significant incontinence. She does have some baseline mild stress incontinence with laughing, coughing, sneezing  but this is minimal.  She is not currently sexually active. She denies any vaginal pain, bulging, or dyspareunia. No history of GYN surgeries other than endometrial ablation.  No history of kidney stones, flank pain, gross hematuria, recurrent urinary tract infections.   PMH: Past Medical History  Diagnosis Date  . Hypertension   . Hyperlipidemia   . Obesity   . Depression   . Anxiety   . IDDM (insulin dependent diabetes mellitus)   . Colon polyp   . GERD (gastroesophageal reflux disease)   . History of left heart catheterization (LHC)     a. cath 08/09/2003: mild LAD plaque, no obs dzs noted; b. 12/18/10 stress test w/o ischemia.   . Worsening headaches   . Slurred speech   . Depression   . CVA (cerebral vascular accident)     a. 06/2008-TEE nl LV fxn; b. 01/2013 - notes indicate short run of SVT at that time  . Multiple sclerosis 2015  . Syncope   . Chronic systolic CHF (congestive heart failure)     a. echo 01/29/2013 EF 40-45%, basal anteroseptal & mid anteroseptal segments abnl; b echo 05/2012 EF 50-55%, mildly dilated LA, nl RVSP  . Orthostatic hypotension   . Aortic arch aneurysm   . Cardiomyopathy   . Acute on chronic renal failure   . MS (mitral stenosis)   . Cancer     Surgical History: Past Surgical History  Procedure Laterality Date  .  Appendectomy    . Right oophorectomy    . Tubal ligation      R tube removed still w/ Left  . Dilation and curettage of uterus    . Gastric bypass  09/2009  . S/p gastric bypass      2011 [facility]  . Cardiac catheterization      [place] removed      Home Medications:    Medication List       This list is accurate as of: 07/10/14 11:59 PM.  Always use your most recent med list.               atorvastatin 80 MG tablet  Commonly known as:  LIPITOR  Take 1 tablet by mouth daily.     atorvastatin 40 MG tablet  Commonly known as:  LIPITOR  Take 40 mg by mouth daily.     canagliflozin 300 MG Tabs  tablet  Commonly known as:  INVOKANA  Take 300 mg by mouth daily.     carvedilol 6.25 MG tablet  Commonly known as:  COREG  Take 1 tablet (6.25 mg total) by mouth 2 (two) times daily with a meal.     chlorthalidone 25 MG tablet  Commonly known as:  HYGROTON     citalopram 40 MG tablet  Commonly known as:  CELEXA  Take by mouth every morning.     cloNIDine 0.2 MG tablet  Commonly known as:  CATAPRES  Take 0.2 mg by mouth daily.     clopidogrel 75 MG tablet  Commonly known as:  PLAVIX  Take 75 mg by mouth daily with breakfast.     cyclobenzaprine 10 MG tablet  Commonly known as:  FLEXERIL  Take 10 mg by mouth 3 (three) times daily as needed for muscle spasms.     escitalopram 20 MG tablet  Commonly known as:  LEXAPRO  Take 20 mg by mouth daily.     gabapentin 300 MG capsule  Commonly known as:  NEURONTIN  Take 1 capsule by mouth 3 (three) times daily.     gabapentin 300 MG capsule  Commonly known as:  NEURONTIN  Take 1 capsule (300 mg total) by mouth 3 (three) times daily.     hydrochlorothiazide 25 MG tablet  Commonly known as:  HYDRODIURIL  Take 25 mg by mouth daily.     insulin aspart 100 UNIT/ML injection  Commonly known as:  novoLOG  Inject 4-15 Units into the skin 3 (three) times daily as needed for high blood sugar. Takes anywhere from 4-15 units as directed per sliding scale     Insulin Detemir 100 UNIT/ML Pen  Commonly known as:  LEVEMIR  Inject into the skin as needed.     insulin detemir 100 UNIT/ML injection  Commonly known as:  LEVEMIR  Inject 25 Units into the skin as needed.     levothyroxine 25 MCG tablet  Commonly known as:  SYNTHROID, LEVOTHROID  Take 25 mcg by mouth daily before breakfast.     metFORMIN 1000 MG tablet  Commonly known as:  GLUCOPHAGE  Take 1,000 mg by mouth 2 (two) times daily with a meal.     multivitamin tablet  Take 1 tablet by mouth daily.     ONE TOUCH ULTRA TEST test strip  Generic drug:  glucose blood  1 each  by Other route. Use as instructed  Up to 6 times daily     pantoprazole 40 MG tablet  Commonly known as:  PROTONIX  Take 80 mg by mouth daily.     promethazine 25 MG tablet  Commonly known as:  PHENERGAN  Take 25 mg by mouth every 6 (six) hours as needed. For nausea     QUEtiapine Fumarate 150 MG 24 hr tablet  Commonly known as:  SEROQUEL XR  Take 150 mg by mouth at bedtime.     QUEtiapine 50 MG Tb24 24 hr tablet  Commonly known as:  SEROQUEL XR  Take 2 tablets by mouth at bedtime.     SODIUM BICARBONATE (ANTACID) PO  Take 1 tablet by mouth 2 (two) times daily.     temazepam 15 MG capsule  Commonly known as:  RESTORIL  Take 1 capsule (15 mg total) by mouth at bedtime as needed. for sleep     VASOTEC 5 MG tablet  Generic drug:  enalapril  Take 1 tablet (5 mg total) by mouth daily.     WELLBUTRIN XL 300 MG 24 hr tablet  Generic drug:  buPROPion  Take 300 mg by mouth daily.        Allergies:  Allergies  Allergen Reactions  . Tramadol     MOUTH BURNING AND CONFUSION    Family History: Family History  Problem Relation Age of Onset  . Hypertension Mother   . Kidney disease Father   . Heart disease Father   . Hypertension Father   . Diabetes Father   . Stroke Father   . Colon cancer Father     colon cancer    Social History:  reports that she has quit smoking. She has never used smokeless tobacco. She reports that she does not drink alcohol or use illicit drugs.  ROS: Urological Symptom Review  Patient is experiencing the following symptoms: Frequent urination Leakage of urine   Review of Systems  Gastrointestinal (upper)  : Nausea  Gastrointestinal (lower) : Negative for lower GI symptoms  Constitutional : Night Sweats Fatigue  Skin: Negative for skin symptoms  Eyes: Blurred vision  Ear/Nose/Throat : Negative for Ear/Nose/Throat symptoms  Hematologic/Lymphatic: Negative for Hematologic/Lymphatic symptoms  Cardiovascular : Negative  for cardiovascular symptoms  Respiratory : Negative for respiratory symptoms  Endocrine: Excessive thirst  Musculoskeletal: Back pain Joint pain  Neurological: Headaches Dizziness  Psychologic: Depression Anxiety   Physical Exam: BP 164/81 mmHg  Pulse 73  Ht 5\' 3"  (1.6 m)  Wt 182 lb 9.6 oz (82.827 kg)  BMI 32.35 kg/m2  Normal external genitalia.  Normal urethral meatus. No urethral masses and/or tenderness. No bladder fullness or masses. No vaginal lesions or discharge.   Laboratory Data: Lab Results  Component Value Date   WBC 12.2* 06/03/2014   HGB 11.8* 06/03/2014   HCT 37.3 06/03/2014   MCV 77.6* 06/03/2014   PLT 255 06/03/2014    Lab Results  Component Value Date   CREATININE 1.46* 06/03/2014    No results found for: PSA  No results found for: TESTOSTERONE  Lab Results  Component Value Date   HGBA1C 7.3* 04/27/2011    Urinalysis    Component Value Date/Time   COLORURINE YELLOW* 06/03/2014 1419   APPEARANCEUR CLEAR* 06/03/2014 1419   LABSPEC 1.013 06/03/2014 1419   PHURINE 5.0 06/03/2014 1419   GLUCOSEU 2+* 07/10/2014 1125   [place] 06/03/2014 1419   HGBUR negative 04/14/2010 1435   BILIRUBINUR Negative 07/10/2014 1125   [place] 06/03/2014 1419   BILIRUBINUR neg 02/23/2011 1540   [place] 06/03/2014 1419   PROTEINUR NEGATIVE  06/03/2014 1419   PROTEINUR neg 02/23/2011 1540   UROBILINOGEN 0.2 02/23/2011 1540   UROBILINOGEN negative 04/14/2010 1435   NITRITE Negative 07/10/2014 1125   NITRITE NEGATIVE 06/03/2014 1419   NITRITE neg 02/23/2011 1540   LEUKOCYTESUR Trace* 07/10/2014 1125   LEUKOCYTESUR NEGATIVE 06/03/2014 1419    Pertinent Imaging: Results for ST SEMAJE, ROETHER (MRN [id number]) as of 07/10/2014 16:18  Ref. Range 07/10/2014 11:43  Scan Result Unknown [id number]    Assessment & Plan:    1. Urinary incontinence in female-  Patient with MS and worsening of her urinary leakage.  She was started on  Vesicare at her last visit 2 weeks ago in hopes that it would control the urgency component of the incontinence.  She did not notice any improvement and her PVR increased.  She is experiencing a worsening in her MS, as well.  We will hold the Vesicare and have patient return in 2 weeks for a PVR.  She will contact the office if she experiences a complete inability to void or seek care in the ED.   We will also schedule UDS. I explained to the patient that urodynamics is a study that assesses how the bladder and urethra are performing their job of storing and releasing urine.  Urodynamic tests can help explain symptoms such as: incontinence, frequent urination, urgency, hesitancy, dysuria, urinary retention and/or recurrent UTI's.  To perform the test, a special catheter is placed into the urethra, a rectal catheter is placed and electrodes are placed around the perineum.  The bladder is filled with water and the patient will be asked to void, it they can.  There may be a video component involved at some center, where the bladder activity is monitored by a video.   Patient prefers [facility] or [facility].  She does not want to go to [place].  If her PVR remains elevated at return visit, we will discuss CIC and instruct her in this technique.  She was not interested in learning CIC at this time.    - Urinalysis, Complete - Bladder Scan (Post Void Residual) in office  2. Incomplete bladder emptying- Patient's residual increased since Dr. Erlene Quan saw her two weeks ago from 111 cc to 376 cc.  I straight cathed the patient today and received a PVR of 380 cc of clear yellow urine.  I was hoping she would see that self cathing would not be difficult to learn.  She did not want to learn CIC and wants to wait to see if her PVR returns to baseline when she is no longer taking the [place].  She will not continue the Vesicare and will RTC in 2 weeks for follow up PVR.  If it remains elevated, we will need to teach her CIC  techniques.  She is also being referred to [facility] or [facility] for UDS.  She does not want to go to [place].     3. UTI- Patient had a +UCx two weeks ago with Klebsiella.  We will check her UA and sent her urine for UCx at her return visit in 2 weeks.   No Follow-up on file.  Zara Council, [facility] [address], [place], [place] [address] [phone]

## 2014-07-11 ENCOUNTER — Telehealth: Payer: Self-pay

## 2014-07-11 NOTE — Telephone Encounter (Signed)
Pt stated her DM are controlled. Last week her HgbA1c was 6.0.

## 2014-07-11 NOTE — Telephone Encounter (Signed)
-----   Message from Nori Riis, PA-C sent at 07/11/2014  8:15 AM EDT ----- Patient's glucose is elevated in her urine.  If her DM is uncontrolled, that can also affect the bladder adversely.

## 2014-07-12 DIAGNOSIS — R339 Retention of urine, unspecified: Secondary | ICD-10-CM | POA: Insufficient documentation

## 2014-07-16 ENCOUNTER — Other Ambulatory Visit: Payer: Self-pay | Admitting: Urology

## 2014-07-16 ENCOUNTER — Other Ambulatory Visit: Payer: Self-pay | Admitting: Family Medicine

## 2014-07-16 DIAGNOSIS — R339 Retention of urine, unspecified: Secondary | ICD-10-CM

## 2014-07-17 ENCOUNTER — Ambulatory Visit (INDEPENDENT_AMBULATORY_CARE_PROVIDER_SITE_OTHER): Payer: BC Managed Care – PPO | Admitting: Urology

## 2014-07-17 ENCOUNTER — Encounter: Payer: Self-pay | Admitting: Urology

## 2014-07-17 VITALS — BP 137/89 | HR 64 | Ht 63.0 in | Wt 186.0 lb

## 2014-07-17 DIAGNOSIS — R32 Unspecified urinary incontinence: Secondary | ICD-10-CM | POA: Diagnosis not present

## 2014-07-17 DIAGNOSIS — R339 Retention of urine, unspecified: Secondary | ICD-10-CM | POA: Diagnosis not present

## 2014-07-17 DIAGNOSIS — N390 Urinary tract infection, site not specified: Secondary | ICD-10-CM

## 2014-07-17 LAB — BLADDER SCAN AMB NON-IMAGING: SCAN RESULT: 439

## 2014-07-17 NOTE — Progress Notes (Signed)
10:01 PM   [place] May 08, 1970 [id number]  Referring provider: [name], MD [address] [place], [place] [address]  Chief Complaint  Patient presents with  . Follow up on uti    HPI: [name] is a 44 y/o white female with MS who presented to [place] office four weeks ago for incontinence.  At that visit, she was experiencing incontinence with no control that started three months ago.  Her leakage is so severe that she has to wear a pad at night for enuresis.    She was given Vesicare 5 mg (2 weeks of samples) by Dr. Erlene Quan to see if her urgency and incontinence would improve to a tolerable level.  She did not notice any change.  Her PVR increased from 111 cc to 376 cc.   She was found to have a +UCx at her last visit and was given Cipro 500 mg for three days.   Previous Hx: She's been a long-standing diabetic since the age of 23 and at times her blood sugar was poorly controlled. She does have a history of peripheral neuropathy, CKDIII and other sequela of her long-standing diabetes. More recently, she reports that her blood sugar control has been more reasonable, per patient most recent hemoglobin A1c 6.9.  She does have baseline urinary urgency but over the past 3 months, started to experience fairly profound urge incontinence. She reports that she will feel the sensation to need to urinate and then immediately go in her pad, relatively large volumes. She is going through 5 pads a day which are saturated and also wearing a diaper at night due to enuresis. Prior to 3 months ago, she denies any significant incontinence. She does have some baseline mild stress incontinence with laughing, coughing, sneezing but this is minimal.  She is not currently sexually active. She denies any vaginal pain, bulging, or dyspareunia. No history of GYN surgeries other than endometrial ablation.  No history of kidney stones, flank pain, gross hematuria, recurrent  urinary tract infections.   She presents today for recheck of her PVR in hopes that discontinuing the Vesicare with lower her PVR volume. It did not.  It was found to be 439 mL.  She is currently not experiencing any dysuria, gross hematuria and/or suprapubic pain.  She has no associated fevers, chills, nausea and/or vomiting.     PMH: Past Medical History  Diagnosis Date  . Hypertension   . Hyperlipidemia   . Obesity   . Depression   . Anxiety   . IDDM (insulin dependent diabetes mellitus)   . Colon polyp   . GERD (gastroesophageal reflux disease)   . History of left heart catheterization (LHC)     a. cath 08/09/2003: mild LAD plaque, no obs dzs noted; b. 12/18/10 stress test w/o ischemia.   . Worsening headaches   . Slurred speech   . Depression   . CVA (cerebral vascular accident)     a. 06/2008-TEE nl LV fxn; b. 01/2013 - notes indicate short run of SVT at that time  . Multiple sclerosis 2015  . Syncope   . Chronic systolic CHF (congestive heart failure)     a. echo 01/29/2013 EF 40-45%, basal anteroseptal & mid anteroseptal segments abnl; b echo 05/2012 EF 50-55%, mildly dilated LA, nl RVSP  . Orthostatic hypotension   . Aortic arch aneurysm   . Cardiomyopathy   . Acute on chronic renal failure   . MS (mitral stenosis)   .  Cancer     Surgical History: Past Surgical History  Procedure Laterality Date  . Appendectomy    . Right oophorectomy    . Tubal ligation      R tube removed still w/ Left  . Dilation and curettage of uterus    . Gastric bypass  09/2009  . S/p gastric bypass      2011 [facility]  . Cardiac catheterization      [place] removed      Home Medications:    Medication List       This list is accurate as of: 07/17/14 11:59 PM.  Always use your most recent med list.               atorvastatin 80 MG tablet  Commonly known as:  LIPITOR  Take 1 tablet by mouth daily.     canagliflozin 300 MG Tabs tablet  Commonly known as:   INVOKANA  Take 300 mg by mouth daily.     carvedilol 12.5 MG tablet  Commonly known as:  COREG  TAKE ONE TABLET BY MOUTH TWICE DAILY     chlorthalidone 25 MG tablet  Commonly known as:  HYGROTON  TAKE ONE TABLET BY MOUTH ONCE DAILY     citalopram 40 MG tablet  Commonly known as:  CELEXA  Take by mouth every morning.     cloNIDine 0.2 MG tablet  Commonly known as:  CATAPRES  Take 0.2 mg by mouth daily.     clopidogrel 75 MG tablet  Commonly known as:  PLAVIX  Take 75 mg by mouth daily with breakfast.     cyclobenzaprine 10 MG tablet  Commonly known as:  FLEXERIL  Take 10 mg by mouth 3 (three) times daily as needed for muscle spasms.     escitalopram 20 MG tablet  Commonly known as:  LEXAPRO  Take 20 mg by mouth daily.     gabapentin 300 MG capsule  Commonly known as:  NEURONTIN  Take 1 capsule by mouth 3 (three) times daily.     gabapentin 300 MG capsule  Commonly known as:  NEURONTIN  Take 1 capsule (300 mg total) by mouth 3 (three) times daily.     hydrochlorothiazide 25 MG tablet  Commonly known as:  HYDRODIURIL  Take 25 mg by mouth daily.     insulin aspart 100 UNIT/ML injection  Commonly known as:  novoLOG  Inject 4-15 Units into the skin 3 (three) times daily as needed for high blood sugar. Takes anywhere from 4-15 units as directed per sliding scale     Insulin Detemir 100 UNIT/ML Pen  Commonly known as:  LEVEMIR  Inject into the skin as needed.     insulin detemir 100 UNIT/ML injection  Commonly known as:  LEVEMIR  Inject 25 Units into the skin as needed.     levothyroxine 25 MCG tablet  Commonly known as:  SYNTHROID, LEVOTHROID  Take 25 mcg by mouth daily before breakfast.     metFORMIN 1000 MG tablet  Commonly known as:  GLUCOPHAGE  Take 1,000 mg by mouth 2 (two) times daily with a meal.     multivitamin tablet  Take 1 tablet by mouth daily.     naproxen 500 MG tablet  Commonly known as:  NAPROSYN     ONE TOUCH ULTRA TEST test strip    Generic drug:  glucose blood  1 each by Other route. Use as instructed  Up to 6 times  daily     pantoprazole 40 MG tablet  Commonly known as:  PROTONIX  Take 80 mg by mouth daily.     promethazine 25 MG tablet  Commonly known as:  PHENERGAN  Take 25 mg by mouth every 6 (six) hours as needed. For nausea     QUEtiapine Fumarate 150 MG 24 hr tablet  Commonly known as:  SEROQUEL XR  Take 150 mg by mouth at bedtime.     QUEtiapine 50 MG Tb24 24 hr tablet  Commonly known as:  SEROQUEL XR  Take 2 tablets by mouth at bedtime.     SODIUM BICARBONATE (ANTACID) PO  Take 1 tablet by mouth 2 (two) times daily.     temazepam 15 MG capsule  Commonly known as:  RESTORIL  Take 1 capsule (15 mg total) by mouth at bedtime as needed. for sleep     VASOTEC 5 MG tablet  Generic drug:  enalapril  Take 1 tablet (5 mg total) by mouth daily.     WELLBUTRIN XL 300 MG 24 hr tablet  Generic drug:  buPROPion  Take 300 mg by mouth daily.        Allergies:  Allergies  Allergen Reactions  . Tramadol     MOUTH BURNING AND CONFUSION    Family History: Family History  Problem Relation Age of Onset  . Hypertension Mother   . Kidney disease Father   . Heart disease Father   . Hypertension Father   . Diabetes Father   . Stroke Father   . Colon cancer Father     colon cancer    Social History:  reports that she has quit smoking. She has never used smokeless tobacco. She reports that she does not drink alcohol or use illicit drugs.  ROS: Urological Symptom Review  Patient is experiencing the following symptoms: Frequent urination Leakage of urine   Review of Systems  Gastrointestinal (upper)  : Nausea  Gastrointestinal (lower) : Negative for lower GI symptoms  Constitutional : Night Sweats Fatigue  Skin: Negative for skin symptoms  Eyes: Blurred vision  Ear/Nose/Throat : Negative for Ear/Nose/Throat symptoms  Hematologic/Lymphatic: Negative for Hematologic/Lymphatic  symptoms  Cardiovascular : Negative for cardiovascular symptoms  Respiratory : Negative for respiratory symptoms  Endocrine: Excessive thirst  Musculoskeletal: Back pain Joint pain  Neurological: Headaches Dizziness  Psychologic: Depression Anxiety   Physical Exam: BP 137/89 mmHg  Pulse 64  Ht 5\' 3"  (1.6 m)  Wt 186 lb (84.369 kg)  BMI 32.96 kg/m2   Laboratory Data: Results for orders placed or performed in visit on 07/17/14  CULTURE, URINE COMPREHENSIVE  Result Value Ref Range   Urine Culture, Comprehensive Final report    Result 1 Comment   Microscopic Examination  Result Value Ref Range   WBC, UA 0-5 0 -  5 /hpf   RBC, UA None seen 0 -  2 /hpf   Epithelial Cells (non renal) 0-10 0 - 10 /hpf   Bacteria, UA Few None seen/Few   Yeast, UA Present (A) None seen  Urinalysis, Complete  Result Value Ref Range   Specific Gravity, UA <1.005 (L) 1.005 - 1.030   pH, UA 5.0 5.0 - 7.5   Color, UA Yellow Yellow   Appearance Ur Cloudy (A) Clear   Leukocytes, UA Trace (A) Negative   Protein, UA Negative Negative/Trace   Glucose, UA 2+ (A) Negative   Ketones, UA Negative Negative   RBC, UA Negative Negative   Bilirubin, UA Negative Negative  Urobilinogen, Ur 0.2 0.2 - 1.0 mg/dL   Nitrite, UA Negative Negative   Microscopic Examination See below:   BLADDER SCAN AMB NON-IMAGING  Result Value Ref Range   Scan Result [id number]    Lab Results  Component Value Date   WBC 12.2* 06/03/2014   HGB 11.8* 06/03/2014   HCT 37.3 06/03/2014   MCV 77.6* 06/03/2014   PLT 255 06/03/2014    Lab Results  Component Value Date   CREATININE 1.46* 06/03/2014    No results found for: PSA  No results found for: TESTOSTERONE  Lab Results  Component Value Date   HGBA1C 7.3* 04/27/2011    Urinalysis    Component Value Date/Time   COLORURINE YELLOW* 06/03/2014 1419   APPEARANCEUR CLEAR* 06/03/2014 1419   LABSPEC 1.013 06/03/2014 1419   PHURINE 5.0 06/03/2014 1419   GLUCOSEU  2+* 07/17/2014 1455   HGBUR NEGATIVE 06/03/2014 1419   HGBUR negative 04/14/2010 1435   BILIRUBINUR Negative 07/17/2014 1455   BILIRUBINUR NEGATIVE 06/03/2014 1419   BILIRUBINUR neg 02/23/2011 1540   KETONESUR NEGATIVE 06/03/2014 1419   PROTEINUR NEGATIVE 06/03/2014 1419   PROTEINUR neg 02/23/2011 1540   UROBILINOGEN 0.2 02/23/2011 1540   UROBILINOGEN negative 04/14/2010 1435   NITRITE Negative 07/17/2014 1455   NITRITE NEGATIVE 06/03/2014 1419   NITRITE neg 02/23/2011 1540   LEUKOCYTESUR Trace* 07/17/2014 1455   LEUKOCYTESUR NEGATIVE 06/03/2014 1419    Pertinent Imaging: Results for [name], [name] (MRN [id number]) as of 07/18/2014 23:14  Ref. Range 07/17/2014 15:19  Scan Result Unknown [id number]     Assessment & Plan:    1. Urinary incontinence in female-  Patient with MS and worsening of her urinary leakage.  She was started on Vesicare at her last visit 4 weeks ago in hopes that it would control the urgency component of the incontinence.  She did not notice any improvement and her PVR increased.  She is experiencing a worsening in her MS, as well.  We held the [facility] and had patient return today for a PVR.    Unfortunately, the patient's PVR only increased. I spoke with the patient offering CIC versus an indwelling Foley. She was a bit resistant at first, but once she was instructed on the technique she found it fairly easy to catheterize herself.  She will herself up to 4 times daily keeping her PVR's below 200 mL.  She is referred to [facility] for UDS.  - Urinalysis, Complete - Bladder Scan (Post Void Residual) in office  2. Incomplete bladder emptying- Patient's residual increase from two weeks ago from 376 cc to 439 cc.  Patient was instructed on self-catheterization today.  3. UTI-   We will check her UA and send her urine for UCx.   No Follow-up on file.  Zara Council, [facility] [address] [place],  [place] [address] [phone]

## 2014-07-18 LAB — URINALYSIS, COMPLETE
BILIRUBIN UA: NEGATIVE
KETONES UA: NEGATIVE
Nitrite, UA: NEGATIVE
PH UA: 5 (ref 5.0–7.5)
Protein, UA: NEGATIVE
RBC UA: NEGATIVE
Urobilinogen, Ur: 0.2 mg/dL (ref 0.2–1.0)

## 2014-07-18 LAB — MICROSCOPIC EXAMINATION: RBC, UA: NONE SEEN /hpf (ref 0–?)

## 2014-07-19 LAB — CULTURE, URINE COMPREHENSIVE

## 2014-07-19 NOTE — Progress Notes (Signed)
Continuous Intermittent Catheterization  Due to incomplete bladder emptying patient is present today for a teaching of self I & O Catheterization. Patient was given detailed verbal and printed instructions of self catheterization. Patient was cleaned and prepped in a sterile fashion.  With instruction and assistance patient inserted a 14FR and urine return was noted 200 ml, urine was yellow in color. Patient tolerated well, no complications were noted Patient was given a sample bag with supplies to take home.  Instructions were given per Zara Council for patient to cath four times daily.  An order was placed with [employer] for catheters to be sent to the patient's home. Patient is to follow up after having UDS.  Preformed by: Toniann Fail, LPN

## 2014-07-20 ENCOUNTER — Telehealth: Payer: Self-pay

## 2014-07-20 NOTE — Telephone Encounter (Signed)
-----   Message from Nori Riis, PA-C sent at 07/20/2014  8:36 AM EDT ----- Patient has a negative UCx.

## 2014-07-20 NOTE — Telephone Encounter (Signed)
Spoke with pt to make aware of negative urine cx. Cw,lpn

## 2014-07-23 ENCOUNTER — Other Ambulatory Visit: Payer: Self-pay | Admitting: Family Medicine

## 2014-07-24 ENCOUNTER — Ambulatory Visit: Payer: BC Managed Care – PPO | Admitting: Psychiatry

## 2014-07-25 ENCOUNTER — Telehealth: Payer: Self-pay | Admitting: Family Medicine

## 2014-07-30 ENCOUNTER — Other Ambulatory Visit: Payer: Self-pay

## 2014-08-02 ENCOUNTER — Telehealth: Payer: Self-pay | Admitting: Family Medicine

## 2014-08-02 NOTE — Telephone Encounter (Signed)
Ok to call in rx.  Thanks.  

## 2014-08-02 NOTE — Telephone Encounter (Signed)
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  Patient states that pharmacy has not received refill for levothyroxine (SYNTHROID, LEVOTHROID) 25 MCG tablet.  Patient is out of this medication.  Looks like it was sent to [facility] on 07/23/2014 but they are saying they did not receive it.   She uses [facility] [place].  CB # [phone] jld

## 2014-08-09 ENCOUNTER — Telehealth: Payer: Self-pay | Admitting: Urology

## 2014-08-09 NOTE — Telephone Encounter (Signed)
LMOM

## 2014-08-09 NOTE — Telephone Encounter (Signed)
Abigail Butts from [place] office is wanting results from the pts urine test. Best number is [phone]) [phone](Rhea)

## 2014-08-09 NOTE — Telephone Encounter (Signed)
Spoke with pt

## 2014-08-13 ENCOUNTER — Telehealth: Payer: Self-pay | Admitting: Psychiatry

## 2014-08-13 NOTE — Telephone Encounter (Signed)
Leveda Anna from [employer] has stopped by the office several times stating he is unable to get in touch with pt. Remo Lipps states his company has to get in touch with pt before catheter supplies can be sent out. Nurse was able to get in contact with pt. Made pt aware [employer] is attempting to get in touch with her. Pt voiced understanding.  Nurse also has attempted to call Abigail Butts at the numbers provided. The lady who answers both number states there is no Abigail Butts at provided numbers. Cw,lpn

## 2014-08-17 MED ORDER — ARIPIPRAZOLE 5 MG PO TABS: 5.0000 mg | ORAL_TABLET | ORAL | Status: DC

## 2014-08-17 NOTE — Telephone Encounter (Signed)
Patient indicated that the co-pay for even the regular release Seroquel is too much for her. I discussed that the alternatives for augmentation for depression. She is artery had an augmentation trial with Wellbutrin. I discussed that perhaps we could use Abilify. Thus I will send in Abilify as an alternative in place of Seroquel. Should that medication be too costly as well we will discuss at the patient's next appointment trying a different antidepressant but side citalopram. Patient was agreeable to this plan.

## 2014-08-20 ENCOUNTER — Encounter: Payer: Self-pay | Admitting: Family Medicine

## 2014-08-20 ENCOUNTER — Ambulatory Visit (INDEPENDENT_AMBULATORY_CARE_PROVIDER_SITE_OTHER): Payer: BC Managed Care – PPO | Admitting: Family Medicine

## 2014-08-20 VITALS — BP 154/90 | HR 86 | Temp 98.4°F | Resp 16 | Wt 173.0 lb

## 2014-08-20 DIAGNOSIS — N760 Acute vaginitis: Secondary | ICD-10-CM | POA: Diagnosis not present

## 2014-08-20 DIAGNOSIS — N390 Urinary tract infection, site not specified: Secondary | ICD-10-CM | POA: Diagnosis not present

## 2014-08-20 LAB — POCT URINALYSIS DIPSTICK
BILIRUBIN UA: NEGATIVE
Glucose, UA: 500
KETONES UA: NEGATIVE
Nitrite, UA: NEGATIVE
Protein, UA: NEGATIVE
Spec Grav, UA: 1.015
Urobilinogen, UA: 0.2
pH, UA: 6

## 2014-08-20 MED ORDER — SULFAMETHOXAZOLE-TRIMETHOPRIM 800-160 MG PO TABS: 1.0000 | ORAL_TABLET | Freq: Two times a day (BID) | ORAL | Status: AC

## 2014-08-20 MED ORDER — FLUCONAZOLE 150 MG PO TABS: 150.0000 mg | ORAL_TABLET | Freq: Once | ORAL | Status: AC

## 2014-08-20 NOTE — Progress Notes (Signed)
Patient: [name] Female    DOB: Jul 31, 1970   44 y.o.   MRN: [id number] Visit Date: 08/20/2014  Today's Provider: [name], MD   Chief Complaint  Patient presents with  . Dysuria   Subjective:    Dysuria  The current episode started 1 to 4 weeks ago. The problem occurs intermittently. The problem has been unchanged. The quality of the pain is described as burning. Associated symptoms include chills, hesitancy, nausea, sweats and urgency. Pertinent negatives include no discharge, flank pain, frequency, hematuria, possible pregnancy or vomiting. She has tried antibiotics for the symptoms. The treatment provided no relief.   Patient comes in today stating she was seen by Dr. Layla Maw at [facility] the first week of July and was told she had a UTI. Cultures grew Klebsiella and she was initially treated with Cephalexin 250mg  four times daily , but was unable to tolerate the medication so she was changed to Tetracycline 250mg  four times daily, which she started 3 days ago. Patient states her symptoms have not improved and she thinks the Tetracycline is making her sick with chills and sweats. Patient thinks she may now also have a yeast infection due to new onset vaginal itching that started 3 days ago. She admits to poor PO intake including very little fluid intake the last several days.     Allergies  Allergen Reactions  . Tramadol     MOUTH BURNING AND CONFUSION   Previous Medications   ARIPIPRAZOLE (ABILIFY) 5 MG TABLET    Take 1 tablet (5 mg total) by mouth every morning. Stop seroquel.   ATORVASTATIN (LIPITOR) 80 MG TABLET    Take 1 tablet by mouth daily.   BUPROPION (WELLBUTRIN XL) 300 MG 24 HR TABLET    Take 300 mg by mouth daily.   CANAGLIFLOZIN 300 MG TABS    Take 300 mg by mouth daily.   CARVEDILOL (COREG) 12.5 MG TABLET    TAKE ONE TABLET BY MOUTH TWICE DAILY   CHLORTHALIDONE (HYGROTON) 25 MG TABLET    TAKE ONE TABLET BY MOUTH ONCE DAILY   CITALOPRAM (CELEXA)  40 MG TABLET    Take by mouth every morning.   CLONIDINE (CATAPRES) 0.2 MG TABLET    Take 0.2 mg by mouth daily.    CLOPIDOGREL (PLAVIX) 75 MG TABLET    Take 75 mg by mouth daily with breakfast.   CYCLOBENZAPRINE (FLEXERIL) 10 MG TABLET    Take 10 mg by mouth 3 (three) times daily as needed for muscle spasms.   ENALAPRIL (VASOTEC) 5 MG TABLET    Take 1 tablet (5 mg total) by mouth daily.   ESCITALOPRAM (LEXAPRO) 20 MG TABLET    Take 20 mg by mouth daily.   GABAPENTIN (NEURONTIN) 300 MG CAPSULE    Take 1 capsule (300 mg total) by mouth 3 (three) times daily.   GABAPENTIN (NEURONTIN) 300 MG CAPSULE    Take 1 capsule by mouth 3 (three) times daily.   GLUCOSE BLOOD (ONE TOUCH ULTRA TEST) TEST STRIP    1 each by Other route. Use as instructed  Up to 6 times daily    HYDROCHLOROTHIAZIDE (HYDRODIURIL) 25 MG TABLET    Take 25 mg by mouth daily.    INSULIN ASPART (NOVOLOG) 100 UNIT/ML INJECTION    Inject 4-15 Units into the skin 3 (three) times daily as needed for high blood sugar. Takes anywhere from 4-15 units as directed per sliding scale   INSULIN  DETEMIR (LEVEMIR) 100 UNIT/ML INJECTION    Inject 25 Units into the skin as needed.    INSULIN DETEMIR (LEVEMIR) 100 UNIT/ML PEN    Inject into the skin as needed.   LEVOTHYROXINE (SYNTHROID, LEVOTHROID) 25 MCG TABLET    TAKE ONE TABLET BY MOUTH ONCE DAILY   METFORMIN (GLUCOPHAGE) 1000 MG TABLET    Take 1,000 mg by mouth 2 (two) times daily with a meal.    MULTIPLE VITAMIN (MULTIVITAMIN) TABLET    Take 1 tablet by mouth daily.    NAPROXEN (NAPROSYN) 500 MG TABLET       PANTOPRAZOLE (PROTONIX) 40 MG TABLET    Take 80 mg by mouth daily.   PROMETHAZINE (PHENERGAN) 25 MG TABLET    Take 25 mg by mouth every 6 (six) hours as needed. For nausea   QUETIAPINE (SEROQUEL XR) 50 MG TB24 24 HR TABLET    Take 2 tablets by mouth at bedtime.   QUETIAPINE FUMARATE (SEROQUEL XR) 150 MG 24 HR TABLET    Take 150 mg by mouth at bedtime.   SODIUM BICARBONATE, ANTACID, PO    Take  1 tablet by mouth 2 (two) times daily.   TEMAZEPAM (RESTORIL) 15 MG CAPSULE    Take 1 capsule (15 mg total) by mouth at bedtime as needed. for sleep   TETRACYCLINE (ACHROMYCIN,SUMYCIN) 250 MG CAPSULE    Take 1 capsule by mouth QID.    Review of Systems  Constitutional: Positive for chills.  Gastrointestinal: Positive for nausea. Negative for vomiting.  Genitourinary: Positive for dysuria, hesitancy and urgency. Negative for frequency, hematuria and flank pain.    History  Substance Use Topics  . Smoking status: Former Research scientist (life sciences)  . Smokeless tobacco: Never Used     Comment: quit 28 years ago  . Alcohol Use: No   Objective:   BP 154/90 mmHg  Pulse 86  Temp(Src) 98.4 F (36.9 C) (Oral)  Resp 16  Wt 173 lb (78.472 kg)  SpO2 98%  Physical Exam    General Appearance:    Alert, cooperative, no distress  Eyes:    PERRL, conjunctiva/corneas clear, EOM's intact       Lungs:     Clear to auscultation bilaterally, respirations unlabored  Heart:    Regular rate and rhythm  Neurologic:   Awake, alert, oriented x 3. No apparent focal neurological           defect.         Results for orders placed or performed in visit on 08/20/14  POCT Urinalysis Dipstick  Result Value Ref Range   Color, UA yellow    Clarity, UA clear    Glucose, UA 500    Bilirubin, UA neg    Ketones, UA neg    Spec Grav, UA 1.015    Blood, UA Trace NH    pH, UA 6.0    Protein, UA NEG    Urobilinogen, UA 0.2    Nitrite, UA NEG    Leukocytes, UA moderate (2+) (A) Negative       Assessment & Plan:      1. UTI (lower urinary tract infection) Positive for Klebsiella at [facility] on 08-09-14. Reviewed C&S and was sensative to trimethoprim sulfa.  - POCT Urinalysis Dipstick - sulfamethoxazole-trimethoprim (BACTRIM DS,SEPTRA DS) 800-160 MG per tablet; Take 1 tablet by mouth 2 (two) times daily.  Dispense: 20 tablet; Refill: 0 - Urine culture - Counseled to increase fluid intake, particularly water. May try a few  glasses of  Cranberry juice a day as well.   2. Vaginitis Likely yeast - fluconazole (DIFLUCAN) 150 MG tablet; Take 1 tablet (150 mg total) by mouth once.  Dispense: 1 tablet; Refill: 0       [name], MD  [facility]

## 2014-08-21 ENCOUNTER — Other Ambulatory Visit: Payer: Self-pay | Admitting: Neurology

## 2014-08-21 DIAGNOSIS — R32 Unspecified urinary incontinence: Secondary | ICD-10-CM

## 2014-08-22 LAB — URINE CULTURE

## 2014-08-22 LAB — PLEASE NOTE

## 2014-08-23 ENCOUNTER — Encounter: Payer: Self-pay | Admitting: Family Medicine

## 2014-08-25 ENCOUNTER — Encounter: Payer: Self-pay | Admitting: Family Medicine

## 2014-08-25 DIAGNOSIS — C439 Malignant melanoma of skin, unspecified: Secondary | ICD-10-CM | POA: Insufficient documentation

## 2014-08-25 DIAGNOSIS — I5022 Chronic systolic (congestive) heart failure: Secondary | ICD-10-CM | POA: Insufficient documentation

## 2014-08-28 ENCOUNTER — Other Ambulatory Visit: Payer: Self-pay | Admitting: Family Medicine

## 2014-08-29 ENCOUNTER — Ambulatory Visit
Admission: RE | Admit: 2014-08-29 | Discharge: 2014-08-29 | Disposition: A | Discharge status: Home / Self Care | Payer: BC Managed Care – PPO | Source: Ambulatory Visit | Attending: Neurology | Admitting: Neurology

## 2014-08-29 DIAGNOSIS — R32 Unspecified urinary incontinence: Secondary | ICD-10-CM | POA: Diagnosis present

## 2014-08-29 MED ORDER — GADOBENATE DIMEGLUMINE 529 MG/ML IV SOLN
10.0000 mL | Freq: Once | INTRAVENOUS | Status: AC | PRN
  Administered 2014-08-29: 8 mL via INTRAVENOUS

## 2014-08-31 ENCOUNTER — Ambulatory Visit (INDEPENDENT_AMBULATORY_CARE_PROVIDER_SITE_OTHER): Payer: BC Managed Care – PPO | Admitting: Psychiatry

## 2014-08-31 ENCOUNTER — Encounter: Payer: Self-pay | Admitting: Psychiatry

## 2014-08-31 VITALS — BP 108/62 | HR 86 | Temp 97.1°F | Ht 63.0 in | Wt 171.4 lb

## 2014-08-31 DIAGNOSIS — F331 Major depressive disorder, recurrent, moderate: Secondary | ICD-10-CM

## 2014-08-31 MED ORDER — CITALOPRAM HYDROBROMIDE 40 MG PO TABS: 40.0000 mg | ORAL_TABLET | Freq: Every day | ORAL | Status: DC

## 2014-08-31 MED ORDER — BREXPIPRAZOLE 1 MG PO TABS: 1.0000 mg | ORAL_TABLET | Freq: Every morning | ORAL | Status: DC

## 2014-08-31 NOTE — Progress Notes (Signed)
[place] MD/PA/NP OP Progress Note  08/31/2014 9:53 AM [name]  MRN:  [id number]  Subjective:  Patient returns for follow-up of her major depressive disorder. She presents with her husband. Husband reports that patient has had issues with irritability. He states she can snap and become very angry. I did review signs of mania and hypomania and it does not appear that is going on where there is episodes of decreased sleep, pressured speech or impulsive or reckless behaviors. Patient states that she does take her citalopram and feels like when she doesn't take it her mood is even more unstable. Patient endorses some anhedonia and lack of enjoyment of things. Husband stated he went to the beach and she did not seem to enjoy things too much.  They expressed that the cost of the other augmenting medications Seroquel and Abilify were too much for them. I did discuss perhaps lithium as augmentation and mood stability however patient is taking a thiazide and it might be ideal if he could select another agent. We have decided we would give Rexulti  Chief Complaint:  Chief Complaint    Follow-up; Anxiety; Depression; Panic Attack     Visit Diagnosis:  No diagnosis found.  Past Medical History:  Past Medical History  Diagnosis Date  . Colon polyp   . GERD (gastroesophageal reflux disease)   . History of left heart catheterization (LHC)     a. cath 08/09/2003: mild LAD plaque, no obs dzs noted; b. 12/18/10 stress test w/o ischemia.   Marland Kitchen CVA (cerebral vascular accident)     a. 06/2008-TEE nl LV fxn; b. 01/2013 - notes indicate short run of SVT at that time  . Multiple sclerosis 2015  . Syncope   . Chronic systolic CHF (congestive heart failure)     a. echo 01/29/2013 EF 40-45%, basal anteroseptal & mid anteroseptal segments abnl; b echo 05/2012 EF 50-55%, mildly dilated LA, nl RVSP  . Orthostatic hypotension   . Aortic arch aneurysm   . Cardiomyopathy   . MS (mitral stenosis)   . Malignant melanoma  of skin of scalp     Past Surgical History  Procedure Laterality Date  . Appendectomy    . Right oophorectomy    . Tubal ligation      R tube removed still w/ Left  . Dilation and curettage of uterus    . Gastric bypass  09/2009  . S/p gastric bypass      2011 [facility]  . Cardiac catheterization      [place] removed     Family History:  Family History  Problem Relation Age of Onset  . Hypertension Mother   . Kidney disease Father   . Heart disease Father   . Hypertension Father   . Diabetes Father   . Stroke Father   . Colon cancer Father     colon cancer  . Kidney disease Sister   . Thyroid nodules Sister   . Hypertension Sister   . Hypertension Sister   . Diabetes Sister   . Hyperlipidemia Sister    Social History:  History   Social History  . Marital Status: Married    Spouse Name: N/A  . Number of Children: 1  . Years of Education: N/A   Occupational History  . CUSTODIAN   . Disabled    Social History Main Topics  . Smoking status: Former Smoker    Types: Cigarettes    Quit date: 08/31/1994  .  Smokeless tobacco: Never Used     Comment: quit 28 years ago  . Alcohol Use: No  . Drug Use: No  . Sexual Activity: Yes    Birth Control/ Protection: None   Other Topics Concern  . None   Social History Narrative   previously did custolial work. 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 Disabled through 08/02/2013 per forms 02/03/13   Additional History:   Assessment:   Musculoskeletal: Strength & Muscle Tone: within normal limits Gait & Station: normal Patient leans: N/A  Psychiatric Specialty Exam: HPI  Review of Systems  Psychiatric/Behavioral: Positive for depression. Negative for suicidal ideas, hallucinations, memory loss and substance abuse. The patient is not nervous/anxious and does not have insomnia.     Blood pressure 108/62, pulse 86, temperature 97.1 F (36.2 C), temperature source Tympanic,  height 5\' 3"  (1.6 m), weight 171 lb 6.4 oz (77.747 kg), SpO2 98 %.Body mass index is 30.37 kg/(m^2).  General Appearance: Neat and Well Groomed  Eye Contact:  Good  Speech:  Normal Rate  Volume:  Normal  Mood:  Not good  Affect:  Constricted  Thought Process:  Linear and Logical  Orientation:  Full (Time, Place, and Person)  Thought Content:  Negative  Suicidal Thoughts:  No  Homicidal Thoughts:  No  Memory:  Immediate;   Good Recent;   Good Remote;   Good  Judgement:  Good  Insight:  Good  Psychomotor Activity:  Negative  Concentration:  Good  Recall:  Good  Fund of Knowledge: Good  Language: Good  Akathisia:  Negative  Handed:  Right  AIMS (if indicated):  Done on 3/16 and was normal  Assets:  Communication Skills Desire for Improvement Social Support  ADL's:  Intact  Cognition: WNL  Sleep:  At times too much   Is the patient at risk to self?  No. Has the patient been a risk to self in the past 6 months?  No. Has the patient been a risk to self within the distant past?  Yes.   November 2015 Is the patient a risk to others?  No. Has the patient been a risk to others in the past 6 months?  No. Has the patient been a risk to others within the distant past?  No.  Current Medications: Current Outpatient Prescriptions  Medication Sig Dispense Refill  . atorvastatin (LIPITOR) 80 MG tablet Take 1 tablet by mouth daily.    . Canagliflozin 300 MG TABS Take 300 mg by mouth daily.    . carvedilol (COREG) 12.5 MG tablet TAKE ONE TABLET BY MOUTH TWICE DAILY 60 tablet 12  . chlorthalidone (HYGROTON) 25 MG tablet TAKE ONE TABLET BY MOUTH ONCE DAILY 30 tablet 6  . citalopram (CELEXA) 40 MG tablet Take by mouth every morning.    . cloNIDine (CATAPRES) 0.2 MG tablet Take 0.2 mg by mouth daily.   5  . clopidogrel (PLAVIX) 75 MG tablet Take 75 mg by mouth daily with breakfast.    . glucose blood (ONE TOUCH ULTRA TEST) test strip 1 each by Other route. Use as instructed  Up to 6 times  daily     . hydrochlorothiazide (HYDRODIURIL) 25 MG tablet Take 25 mg by mouth daily.     . insulin aspart (NOVOLOG) 100 UNIT/ML injection Inject 4-15 Units into the skin 3 (three) times daily as needed for high blood sugar. Takes anywhere from 4-15 units as directed per sliding scale    .  insulin detemir (LEVEMIR) 100 UNIT/ML injection Inject 25 Units into the skin as needed.     Marland Kitchen levothyroxine (SYNTHROID, LEVOTHROID) 25 MCG tablet TAKE ONE TABLET BY MOUTH ONCE DAILY 30 tablet 12  . metFORMIN (GLUCOPHAGE) 1000 MG tablet Take 1,000 mg by mouth 2 (two) times daily with a meal.     . pantoprazole (PROTONIX) 40 MG tablet Take 80 mg by mouth daily.    . promethazine (PHENERGAN) 25 MG tablet Take 25 mg by mouth every 6 (six) hours as needed. For nausea    . QUEtiapine (SEROQUEL XR) 50 MG TB24 24 hr tablet Take 2 tablets by mouth at bedtime.    . temazepam (RESTORIL) 15 MG capsule Take 1 capsule (15 mg total) by mouth at bedtime as needed. for sleep 30 capsule 3  . Brexpiprazole (REXULTI) 1 MG TABS Take 1 mg by mouth every morning. 30 tablet 1   Current Facility-Administered Medications  Medication Dose Route Frequency Provider Last Rate Last Dose  . methylPREDNISolone sodium succinate (SOLU-MEDROL) 500 mg in sodium chloride 0.9 % 100 mL IVPB  500 mg Intravenous Continuous Marcial Pacas, MD 0 mL/hr at 06/07/12 1600 250 mg at 06/07/12 1836  . valproate (DEPACON) 500 mg in sodium chloride 0.9 % 100 mL IVPB  500 mg Intravenous Continuous Marcial Pacas, MD 105 mL/hr at 06/07/12 1450 500 mg at 06/07/12 1450    Medical Decision Making:  Established Problem, Worsening (2), Review of Medication Regimen & Side Effects (2) and Review of New Medication or Change in Dosage (2)  Treatment Plan Summary:Medication management and Plan We will continue the patient's citalopram at 40 mg a day. I have told her to refrain from the to temazepam as much as possible. It appears this is prescribed by her primary care. We will start  rexulti 0.5 milligrams in the morning for 7 days and then she will increase to 1 mg. I provided her with sample packs two 0.5/1mg  starter and one 1mg /2mg  starter pack. I've written out instructions for her to start with a starter pack taking 0.5 for for 7 days and then going to the 1 mg tablets. I have also given her some other sample packs and instructed her that when she has completed the initial 0.5 mg for 7 days she should then use the additional 0.5 mg tablets and take 2 of them to equal her dose of 1 mg. I've also given her a sample pack to contains 1 mg and 2 mg tablets. I've written out instructions for her to take the 1 mg tablets daily and then to cut the 2 mg tablets in half to equal her daily dose of 1 mg. She's been provided written instructions on the sample packs. She'll follow-up in one month. She's been encouraged calling questions concerns prior to next point. Alternatives depending on success of this trial may be to implement a trial of clonazepam or Abilify.   [name] 08/31/2014, 9:53 AM

## 2014-08-31 NOTE — Addendum Note (Signed)
Addended by: Marjie Skiff on: 08/31/2014 10:11 AM   Modules accepted: Orders

## 2014-09-13 ENCOUNTER — Encounter: Payer: Self-pay | Admitting: *Deleted

## 2014-09-14 ENCOUNTER — Encounter
Admission: RE | Disposition: A | Discharge status: Home / Self Care | Payer: Self-pay | Source: Ambulatory Visit | Attending: Gastroenterology

## 2014-09-14 ENCOUNTER — Ambulatory Visit: Payer: BC Managed Care – PPO | Admitting: Certified Registered"

## 2014-09-14 ENCOUNTER — Encounter: Payer: Self-pay | Admitting: Anesthesiology

## 2014-09-14 ENCOUNTER — Ambulatory Visit
Admission: RE | Admit: 2014-09-14 | Discharge: 2014-09-14 | Disposition: A | Discharge status: Home / Self Care | Payer: BC Managed Care – PPO | Source: Ambulatory Visit | Attending: Gastroenterology | Admitting: Gastroenterology

## 2014-09-14 ENCOUNTER — Other Ambulatory Visit: Payer: Self-pay | Admitting: Family Medicine

## 2014-09-14 DIAGNOSIS — Z9884 Bariatric surgery status: Secondary | ICD-10-CM | POA: Diagnosis not present

## 2014-09-14 DIAGNOSIS — Z85820 Personal history of malignant melanoma of skin: Secondary | ICD-10-CM | POA: Diagnosis not present

## 2014-09-14 DIAGNOSIS — I5022 Chronic systolic (congestive) heart failure: Secondary | ICD-10-CM | POA: Diagnosis not present

## 2014-09-14 DIAGNOSIS — Z8673 Personal history of transient ischemic attack (TIA), and cerebral infarction without residual deficits: Secondary | ICD-10-CM | POA: Diagnosis not present

## 2014-09-14 DIAGNOSIS — Z86010 Personal history of colonic polyps: Secondary | ICD-10-CM | POA: Diagnosis not present

## 2014-09-14 DIAGNOSIS — R1013 Epigastric pain: Secondary | ICD-10-CM | POA: Diagnosis present

## 2014-09-14 DIAGNOSIS — E119 Type 2 diabetes mellitus without complications: Secondary | ICD-10-CM

## 2014-09-14 DIAGNOSIS — I25119 Atherosclerotic heart disease of native coronary artery with unspecified angina pectoris: Secondary | ICD-10-CM | POA: Insufficient documentation

## 2014-09-14 DIAGNOSIS — K219 Gastro-esophageal reflux disease without esophagitis: Secondary | ICD-10-CM | POA: Insufficient documentation

## 2014-09-14 DIAGNOSIS — I951 Orthostatic hypotension: Secondary | ICD-10-CM | POA: Insufficient documentation

## 2014-09-14 DIAGNOSIS — I429 Cardiomyopathy, unspecified: Secondary | ICD-10-CM | POA: Insufficient documentation

## 2014-09-14 DIAGNOSIS — I739 Peripheral vascular disease, unspecified: Secondary | ICD-10-CM | POA: Insufficient documentation

## 2014-09-14 DIAGNOSIS — E785 Hyperlipidemia, unspecified: Secondary | ICD-10-CM | POA: Insufficient documentation

## 2014-09-14 DIAGNOSIS — F419 Anxiety disorder, unspecified: Secondary | ICD-10-CM | POA: Insufficient documentation

## 2014-09-14 DIAGNOSIS — R1310 Dysphagia, unspecified: Secondary | ICD-10-CM | POA: Insufficient documentation

## 2014-09-14 DIAGNOSIS — I10 Essential (primary) hypertension: Secondary | ICD-10-CM | POA: Insufficient documentation

## 2014-09-14 DIAGNOSIS — G35 Multiple sclerosis: Secondary | ICD-10-CM | POA: Insufficient documentation

## 2014-09-14 DIAGNOSIS — E039 Hypothyroidism, unspecified: Secondary | ICD-10-CM | POA: Insufficient documentation

## 2014-09-14 DIAGNOSIS — Z87891 Personal history of nicotine dependence: Secondary | ICD-10-CM | POA: Insufficient documentation

## 2014-09-14 DIAGNOSIS — F329 Major depressive disorder, single episode, unspecified: Secondary | ICD-10-CM | POA: Insufficient documentation

## 2014-09-14 DIAGNOSIS — I050 Rheumatic mitral stenosis: Secondary | ICD-10-CM | POA: Insufficient documentation

## 2014-09-14 DIAGNOSIS — I209 Angina pectoris, unspecified: Secondary | ICD-10-CM | POA: Diagnosis not present

## 2014-09-14 HISTORY — DX: Type 2 diabetes mellitus without complications: E11.9

## 2014-09-14 HISTORY — PX: ESOPHAGOGASTRODUODENOSCOPY (EGD) WITH PROPOFOL: SHX5813

## 2014-09-14 LAB — GLUCOSE, CAPILLARY: GLUCOSE-CAPILLARY: 103 mg/dL — AB (ref 65–99)

## 2014-09-14 SURGERY — ESOPHAGOGASTRODUODENOSCOPY (EGD) WITH PROPOFOL
Anesthesia: General

## 2014-09-14 MED ORDER — PROPOFOL INFUSION 10 MG/ML OPTIME
INTRAVENOUS | Status: DC | PRN
  Administered 2014-09-14: 120 ug/kg/min via INTRAVENOUS

## 2014-09-14 MED ORDER — FENTANYL CITRATE (PF) 100 MCG/2ML IJ SOLN
INTRAMUSCULAR | Status: DC | PRN
  Administered 2014-09-14: 50 ug via INTRAVENOUS

## 2014-09-14 MED ORDER — INSULIN DETEMIR 100 UNIT/ML ~~LOC~~ SOLN: 25.0000 [IU] | SUBCUTANEOUS | Status: DC | PRN

## 2014-09-14 MED ORDER — SODIUM CHLORIDE 0.9 % IV SOLN: INTRAVENOUS | Status: DC

## 2014-09-14 MED ORDER — SODIUM CHLORIDE 0.9 % IV SOLN
INTRAVENOUS | Status: DC
  Administered 2014-09-14: 1000 mL via INTRAVENOUS

## 2014-09-14 MED ORDER — LIDOCAINE HCL (CARDIAC) 20 MG/ML IV SOLN
INTRAVENOUS | Status: DC | PRN
  Administered 2014-09-14: 50 mg via INTRAVENOUS

## 2014-09-14 MED ORDER — PROPOFOL 10 MG/ML IV BOLUS
INTRAVENOUS | Status: DC | PRN
  Administered 2014-09-14: 50 mg via INTRAVENOUS

## 2014-09-14 MED ORDER — GLYCOPYRROLATE 0.2 MG/ML IJ SOLN
INTRAMUSCULAR | Status: DC | PRN
  Administered 2014-09-14: 0.2 mg via INTRAVENOUS

## 2014-09-14 MED ORDER — MIDAZOLAM HCL 5 MG/5ML IJ SOLN
INTRAMUSCULAR | Status: DC | PRN
  Administered 2014-09-14: 1 mg via INTRAVENOUS

## 2014-09-14 NOTE — H&P (Signed)
Primary Care Physician:  [name], MD  Pre-Procedure History & Physical: HPI:  [name] is a 44 y.o. female is here for an endoscopy.   Past Medical History  Diagnosis Date  . Colon polyp   . GERD (gastroesophageal reflux disease)   . History of left heart catheterization (LHC)     a. cath 08/09/2003: mild LAD plaque, no obs dzs noted; b. 12/18/10 stress test w/o ischemia.   Marland Kitchen CVA (cerebral vascular accident)     a. 06/2008-TEE nl LV fxn; b. 01/2013 - notes indicate short run of SVT at that time  . Multiple sclerosis 2015  . Syncope   . Chronic systolic CHF (congestive heart failure)     a. echo 01/29/2013 EF 40-45%, basal anteroseptal & mid anteroseptal segments abnl; b echo 05/2012 EF 50-55%, mildly dilated LA, nl RVSP  . Orthostatic hypotension   . Aortic arch aneurysm   . Cardiomyopathy   . MS (mitral stenosis)   . Malignant melanoma of skin of scalp   . Hypertension   . Depression   . Diabetes mellitus without complication   . Headache     Migrains  . Hyperlipidemia   . Renal disease   . Renal disease   . Hypothyroidism   . Anxiety     Past Surgical History  Procedure Laterality Date  . Appendectomy    . Right oophorectomy    . Tubal ligation      R tube removed still w/ Left  . Dilation and curettage of uterus    . Gastric bypass  09/2009  . S/p gastric bypass      2011 [facility]  . Cardiac catheterization      [place] removed      Prior to Admission medications   Medication Sig Start Date End Date Taking? Authorizing Provider  aspirin 325 MG EC tablet Take 325 mg by mouth daily.   Yes Historical Provider, MD  atorvastatin (LIPITOR) 80 MG tablet Take 80 mg by mouth daily.    Yes Historical Provider, MD  baclofen (LIORESAL) 10 MG tablet Take 10 mg by mouth 3 (three) times daily.   Yes Historical Provider, MD  Brexpiprazole (REXULTI) 1 MG TABS Take 1 mg by mouth every morning. 08/31/14  Yes Marjie Skiff, MD  Canagliflozin 300  MG TABS Take 300 mg by mouth daily.   Yes Historical Provider, MD  carvedilol (COREG) 12.5 MG tablet TAKE ONE TABLET BY MOUTH TWICE DAILY 08/28/14  Yes [name], MD  chlorthalidone (HYGROTON) 25 MG tablet TAKE ONE TABLET BY MOUTH ONCE DAILY 07/16/14  Yes [name], MD  citalopram (CELEXA) 40 MG tablet Take 1 tablet (40 mg total) by mouth daily. 08/31/14  Yes Marjie Skiff, MD  citalopram (CELEXA) 40 MG tablet Take 40 mg by mouth daily.   Yes Historical Provider, MD  cloNIDine (CATAPRES) 0.2 MG tablet Take 0.2 mg by mouth daily.  01/06/14  Yes Historical Provider, MD  clopidogrel (PLAVIX) 75 MG tablet Take 75 mg by mouth daily with breakfast.   Yes Historical Provider, MD  glucose blood (ONE TOUCH ULTRA TEST) test strip 1 each by Other route. Use as instructed  Up to 6 times daily    Yes Historical Provider, MD  hydrochlorothiazide (HYDRODIURIL) 25 MG tablet Take 25 mg by mouth daily.  02/11/14  Yes Historical Provider, MD  insulin aspart (NOVOLOG) 100 UNIT/ML injection Inject 4-15 Units into the skin 3 (three)  times daily as needed for high blood sugar. Takes anywhere from 4-15 units as directed per sliding scale   Yes Historical Provider, MD  insulin detemir (LEVEMIR) 100 UNIT/ML injection Inject 25 Units into the skin as needed.    Yes Historical Provider, MD  levothyroxine (SYNTHROID, LEVOTHROID) 25 MCG tablet TAKE ONE TABLET BY MOUTH ONCE DAILY 07/23/14  Yes [name], MD  metFORMIN (GLUCOPHAGE) 1000 MG tablet Take 1,000 mg by mouth 2 (two) times daily with a meal.  03/05/14  Yes Historical Provider, MD  pantoprazole (PROTONIX) 40 MG tablet Take 80 mg by mouth daily.   Yes Historical Provider, MD  promethazine (PHENERGAN) 25 MG tablet Take 25 mg by mouth every 6 (six) hours as needed. For nausea 12/22/10  Yes Midge Minium, MD  QUEtiapine (SEROQUEL XR) 50 MG TB24 24 hr tablet Take 2 tablets by mouth at bedtime. 04/05/14  Yes Historical Provider, MD  temazepam (RESTORIL) 15 MG  capsule Take 1 capsule (15 mg total) by mouth at bedtime as needed. for sleep 07/10/14  Yes [name], MD    Allergies as of 09/06/2014 - Review Complete 08/31/2014  Allergen Reaction Noted  . Tramadol  01/12/2013    Family History  Problem Relation Age of Onset  . Hypertension Mother   . Kidney disease Father   . Heart disease Father   . Hypertension Father   . Diabetes Father   . Stroke Father   . Colon cancer Father     colon cancer  . Kidney disease Sister   . Thyroid nodules Sister   . Hypertension Sister   . Hypertension Sister   . Diabetes Sister   . Hyperlipidemia Sister     Social History   Social History  . Marital Status: Married    Spouse Name: N/A  . Number of Children: 1  . Years of Education: N/A   Occupational History  . CUSTODIAN   . Disabled    Social History Main Topics  . Smoking status: Former Smoker    Types: Cigarettes    Quit date: 08/31/1994  . Smokeless tobacco: Never Used     Comment: quit 28 years ago  . Alcohol Use: No  . Drug Use: No  . Sexual Activity: Yes    Birth Control/ Protection: None   Other Topics Concern  . Not on file   Social History Narrative   previously did custolial work. 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 Disabled through 08/02/2013 per forms 02/03/13     Physical Exam: BP 106/71 mmHg  Pulse 68  Temp(Src) 98.6 F (37 C) (Tympanic)  Resp 16  Ht 5\' 3"  (1.6 m)  Wt 77.565 kg (171 lb)  BMI 30.30 kg/m2  SpO2 100% General:   Alert,  pleasant and cooperative in NAD Head:  Normocephalic and atraumatic. Neck:  Supple; no masses or thyromegaly. Lungs:  Clear throughout to auscultation.    Heart:  Regular rate and rhythm. Abdomen:  Soft, nontender and nondistended. Normal bowel sounds, without guarding, and without rebound.   Neurologic:  Alert and  oriented x4;  grossly normal neurologically.  Impression/Plan: [name] is here for an endoscopy to be performed for  GERD, dysphagia  Risks, benefits, limitations, and alternatives regarding  endoscopy have been reviewed with the patient.  Questions have been answered.  All parties agreeable.   Josefine Class, MD  09/14/2014, 10:16 AM

## 2014-09-14 NOTE — Op Note (Signed)
[facility] Patient Name: [name] Procedure Date: 09/14/2014 10:23 AM MRN: [id number] Account #: [id number] Date of Birth: May 13, 1970 Admit Type: Outpatient Age: 44 Room: [facility] ENDO ROOM 2 Gender: Female Note Status: Finalized Procedure:         Upper GI endoscopy Indications:       Dysphagia, Heartburn, Suspected esophageal reflux Patient Profile:   This is a 44 year old female. Providers:         Gerrit Heck. Rayann Heman, MD Referring MD:      Kirstie Peri. Caryn Section, MD (Referring MD) Medicines:         Propofol per Anesthesia Complications:     No immediate complications. Procedure:         Pre-Anesthesia Assessment:                    - Prior to the procedure, a History and Physical was                     performed, and patient medications, allergies and                     sensitivities were reviewed. The patient's tolerance of                     previous anesthesia was reviewed.                    After obtaining informed consent, the endoscope was passed                     under direct vision. Throughout the procedure, the                     patient's blood pressure, pulse, and oxygen saturations                     were monitored continuously. The Olympus GIF-160 endoscope                     (S#. [id number]) was introduced through the mouth, and                     advanced to the jejunum. The upper GI endoscopy was                     accomplished without difficulty. The patient tolerated the                     procedure well. Findings:      The esophagus was normal.      Evidence of a gastric bypass was found. A gastric pouch with a small       size was found containing pill debris. The staple line appeared intact.       The gastrojejunal anastomosis was characterized by healthy appearing       mucosa. This was traversed. The jejunojejunal anastomosis was       characterized by healthy appearing mucosa.      The examined jejunum was  normal.      Biopsies were taken with a cold forceps in the lower third of the       esophagus for histology. Impression:        - Normal esophagus.                    -  Gastric bypass with a small-sized pouch intact staple                     line.                    - Normal examined jejunum.                    - Biopsies were taken with a cold forceps for histology in                     the lower third of the esophagus. Recommendation:    - Observe patient in GI recovery unit.                    - Resume regular diet.                    - Continue present medications.                    - Await pathology results.                    - Small frequent meals.                    - Consider 24 hour pH study to eval for GERD vs                     hypersensitive esophagus.                    - Consider carafate.                    - Cont PPI                    - The findings and recommendations were discussed with the                     patient.                    - The findings and recommendations were discussed with the                     patient's family. Procedure Code(s): --- Professional ---                    [phone], Esophagogastroduodenoscopy, flexible, transoral;                     with biopsy, single or multiple CPT copyright 2014 American Medical Association. All rights reserved. The codes documented in this report are preliminary and upon coder review may  be revised to meet current compliance requirements. [name], MD 09/14/2014 10:37:14 AM This report has been signed electronically. Number of Addenda: 0 Note Initiated On: 09/14/2014 10:23 AM      [facility] [place]

## 2014-09-14 NOTE — Discharge Instructions (Signed)
YOU HAD AN ENDOSCOPIC PROCEDURE TODAY: Refer to the procedure report that was given to you for any specific questions about what was found during the examination.  If the procedure report does not answer your questions, please call your gastroenterologist to clarify. ° °YOU SHOULD EXPECT: Some feelings of bloating in the abdomen. Passage of more gas than usual.  Walking can help get rid of the air that was put into your GI tract during the procedure and reduce the bloating. If you had a lower endoscopy (such as a colonoscopy or flexible sigmoidoscopy) you may notice spotting of blood in your stool or on the toilet paper.  ° °DIET: Your first meal following the procedure should be a light meal and then it is ok to progress to your normal diet.  A half-sandwich or bowl of soup is an example of a good first meal.  Heavy or fried foods are harder to digest and may make you feel nasueas or bloated.  Drink plenty of fluids but you should avoid alcoholic beverages for 24 hours. ° °ACTIVITY: Your care partner should take you home directly after the procedure.  You should plan to take it easy, moving slowly for the rest of the day.  You can resume normal activity the day after the procedure however you should NOT DRIVE or use heavy machinery for 24 hours (because of the sedation medicines used during the test).   ° °SYMPTOMS TO REPORT IMMEDIATELY  °A gastroenterologist can be reached at any hour.  Please call your doctor's office for any of the following symptoms: ° °· Following lower endoscopy (colonoscopy, flexible sigmoidoscopy) ° Excessive amounts of blood in the stool ° Significant tenderness, worsening of abdominal pains ° Swelling of the abdomen that is new, acute ° Fever of 100° or higher °· Following upper endoscopy (EGD, EUS, ERCP) ° Vomiting of blood or coffee ground material ° New, significant abdominal pain ° New, significant chest pain or pain under the shoulder blades ° Painful or persistently difficult  swallowing ° New shortness of breath ° Black, tarry-looking stools ° °FOLLOW UP: °If any biopsies were taken you will be contacted by phone or by letter within the next 1-3 weeks.  Call your gastroenterologist if you have not heard about the biopsies in 3 weeks.  °Please also call your gastroenterologist's office with any specific questions about appointments or follow up tests. ° °

## 2014-09-14 NOTE — Transfer of Care (Signed)
Immediate Anesthesia Transfer of Care Note  Patient: [name]  Procedure(s) Performed: Procedure(s): ESOPHAGOGASTRODUODENOSCOPY (EGD) WITH PROPOFOL (N/A)  Patient Location: Endoscopy Unit  Anesthesia Type:General  Level of Consciousness: awake  Airway & Oxygen Therapy: Patient Spontanous Breathing and Patient connected to nasal cannula oxygen  Post-op Assessment: Report given to RN  Post vital signs: Reviewed  Last Vitals:  Filed Vitals:   09/14/14 1036  BP: 97/57  Pulse: 73  Temp: 36.5 C  Resp: 12    Complications: No apparent anesthesia complications

## 2014-09-14 NOTE — Anesthesia Preprocedure Evaluation (Signed)
Anesthesia Evaluation  Patient identified by MRN, date of birth, ID band Patient awake    Reviewed: Allergy & Precautions, H&P , NPO status , Patient's Chart, lab work & pertinent test results, reviewed documented beta blocker date and time   History of Anesthesia Complications Negative for: history of anesthetic complications  Airway Mallampati: II  TM Distance: >3 FB Neck ROM: full    Dental no notable dental hx.    Pulmonary neg shortness of breath, neg sleep apnea, neg COPDneg recent URI, former smoker,  breath sounds clear to auscultation  Pulmonary exam normal       Cardiovascular Exercise Tolerance: Good hypertension, On Medications - angina+ Peripheral Vascular Disease and +CHF (cardiomyopathy) - CAD, - Past MI, - Cardiac Stents and - CABG Normal cardiovascular exam+ Valvular Problems/Murmurs (Mitral stenosis) Rhythm:regular Rate:Normal     Neuro/Psych  Headaches, PSYCHIATRIC DISORDERS (depression)  Neuromuscular disease (Multilple sclerosis) CVA (x2, residual left sided weakness), Residual Symptoms    GI/Hepatic Neg liver ROS, PUD, GERD-  Medicated,  Endo/Other  diabetesHypothyroidism   Renal/GU CRFRenal disease  negative genitourinary   Musculoskeletal   Abdominal   Peds  Hematology negative hematology ROS (+)   Anesthesia Other Findings Past Medical History:   Colon polyp                                                  GERD (gastroesophageal reflux disease)                       History of left heart catheterization (LHC)                    Comment:a. cath 08/09/2003: mild LAD plaque, no obs dzs               noted; b. 12/18/10 stress test w/o ischemia.    CVA (cerebral vascular accident)                               Comment:a. 06/2008-TEE nl LV fxn; b. 01/2013 - notes               indicate short run of SVT at that time   Multiple sclerosis                              2015         Syncope                                                       Chronic systolic CHF (congestive heart failure)                Comment:a. echo 01/29/2013 EF 40-45%, basal               anteroseptal & mid anteroseptal segments abnl;               b echo 05/2012 EF 50-55%, mildly dilated LA, nl               RVSP   Orthostatic hypotension  Aortic arch aneurysm                                         Cardiomyopathy                                               MS (mitral stenosis)                                         Malignant melanoma of skin of scalp                          Hypertension                                                 Depression                                                   Diabetes mellitus without complication                       Headache                                                       Comment:Migrains   Hyperlipidemia                                               Renal disease                                                Renal disease                                                Hypothyroidism                                               Anxiety                                                      Reproductive/Obstetrics negative OB ROS  Anesthesia Physical Anesthesia Plan  ASA: III  Anesthesia Plan: General   Post-op Pain Management:    Induction:   Airway Management Planned:   Additional Equipment:   Intra-op Plan:   Post-operative Plan:   Informed Consent: I have reviewed the patients History and Physical, chart, labs and discussed the procedure including the risks, benefits and alternatives for the proposed anesthesia with the patient or authorized representative who has indicated his/her understanding and acceptance.   Dental Advisory Given  Plan Discussed with: Anesthesiologist, CRNA and Surgeon  Anesthesia Plan Comments:         Anesthesia Quick  Evaluation

## 2014-09-14 NOTE — Telephone Encounter (Signed)
Pt contacted office for refill request on the following medications: insulin detemir (LEVEMIR) 100 UNIT/ML injection to [facility] in [place]. Pt stated that she would like to go back on this. Thanks TNP

## 2014-09-16 NOTE — Anesthesia Postprocedure Evaluation (Signed)
  Anesthesia Post-op Note  Patient: [name]  Procedure(s) Performed: Procedure(s): ESOPHAGOGASTRODUODENOSCOPY (EGD) WITH PROPOFOL (N/A)  Anesthesia type:General  Patient location: PACU  Post pain: Pain level controlled  Post assessment: Post-op Vital signs reviewed, Patient's Cardiovascular Status Stable, Respiratory Function Stable, Patent Airway and No signs of Nausea or vomiting  Post vital signs: Reviewed and stable  Last Vitals:  Filed Vitals:   09/14/14 1110  BP: 109/72  Pulse: 81  Temp:   Resp: 17    Level of consciousness: awake, alert  and patient cooperative  Complications: No apparent anesthesia complications

## 2014-09-17 LAB — SURGICAL PATHOLOGY

## 2014-09-19 ENCOUNTER — Telehealth: Payer: Self-pay

## 2014-09-19 NOTE — Telephone Encounter (Signed)
pt called states that she needs a rx for the rexulti but she wants a 2mg  instead of 1mg .  pt was last seen on 08-31-14. next appt is 10-02-14. pt states that "the 1mg  was not working and her stepson told her that she needed to increase it because it not working so she has been taking 2mg  for the last 3 days and she feel much better and it works better."  So pt would like for you to increase her medication to 2mg .  Do you want to increase her to 2mg  or keep her at 1mg  and discuss when she comes back in 10-02-14?

## 2014-09-20 MED ORDER — BREXPIPRAZOLE 2 MG PO TABS: 2.0000 mg | ORAL_TABLET | Freq: Every day | ORAL | Status: DC

## 2014-09-21 NOTE — Telephone Encounter (Signed)
pt was called and told that rx was sent into pharmacy but that she is not to make anymore adjustment to her medications without seeing dr.williams first

## 2014-09-24 ENCOUNTER — Encounter: Payer: Self-pay | Admitting: Cardiovascular Disease

## 2014-10-02 ENCOUNTER — Encounter: Payer: Self-pay | Admitting: Psychiatry

## 2014-10-02 ENCOUNTER — Ambulatory Visit (INDEPENDENT_AMBULATORY_CARE_PROVIDER_SITE_OTHER): Payer: BC Managed Care – PPO | Admitting: Psychiatry

## 2014-10-02 VITALS — BP 134/86 | HR 104 | Temp 97.7°F | Ht 63.0 in | Wt 177.4 lb

## 2014-10-02 DIAGNOSIS — F331 Major depressive disorder, recurrent, moderate: Secondary | ICD-10-CM

## 2014-10-02 MED ORDER — BREXPIPRAZOLE 2 MG PO TABS: 2.0000 mg | ORAL_TABLET | Freq: Every day | ORAL | Status: DC

## 2014-10-02 MED ORDER — CITALOPRAM HYDROBROMIDE 40 MG PO TABS: 40.0000 mg | ORAL_TABLET | Freq: Every day | ORAL | Status: DC

## 2014-10-02 NOTE — Progress Notes (Signed)
[facility] MD/PA/NP OP Progress Note  10/02/2014 11:32 AM [place]  MRN:  [id number]  Subjective:  Patient returns for follow-up of her major depressive disorder. Patient indicated overall she does think she is doing better. She can't say things are perfect. However she states she feels like she is handling things better. She cites a few stressors in her life that she feels she's tolerating better then she might without the current medicines. She states her son just began high school. She states that they have taken in her husband's godson because he has some unstable housing issues at this time. She states overall she does feel like she is not getting his irritated by things and does think the ricks salty has helped her have a more positive mood. She discussed attending a professional football game and having a panic attack as she moved up to the eights that were high up in the stadium. However she states when she was able to sit down and focus on looking only at the field she became calm. Chief Complaint: Better Chief Complaint    Follow-up     Visit Diagnosis:  No diagnosis found.  Past Medical History:  Past Medical History  Diagnosis Date  . Colon polyp   . GERD (gastroesophageal reflux disease)   . History of left heart catheterization (LHC)     a. cath 08/09/2003: mild LAD plaque, no obs dzs noted; b. 12/18/10 stress test w/o ischemia.   Marland Kitchen CVA (cerebral vascular accident)     a. 06/2008-TEE nl LV fxn; b. 01/2013 - notes indicate short run of SVT at that time  . Multiple sclerosis 2015  . Syncope   . Chronic systolic CHF (congestive heart failure)     a. echo 01/29/2013 EF 40-45%, basal anteroseptal & mid anteroseptal segments abnl; b echo 05/2012 EF 50-55%, mildly dilated LA, nl RVSP  . Orthostatic hypotension   . Aortic arch aneurysm   . Cardiomyopathy   . MS (mitral stenosis)   . Malignant melanoma of skin of scalp   . Hypertension   . Depression   . Diabetes mellitus without  complication   . Headache     Migrains  . Hyperlipidemia   . Renal disease   . Renal disease   . Hypothyroidism   . Anxiety     Past Surgical History  Procedure Laterality Date  . Appendectomy    . Right oophorectomy    . Tubal ligation      R tube removed still w/ Left  . Dilation and curettage of uterus    . Gastric bypass  09/2009  . S/p gastric bypass      2011 [facility]  . Cardiac catheterization      August removed    . Esophagogastroduodenoscopy (egd) with propofol N/A 09/14/2014    Procedure: ESOPHAGOGASTRODUODENOSCOPY (EGD) WITH PROPOFOL;  Surgeon: Josefine Class, MD;  Location: [facility] ENDOSCOPY;  Service: Endoscopy;  Laterality: N/A;   Family History:  Family History  Problem Relation Age of Onset  . Hypertension Mother   . Anxiety disorder Mother   . Depression Mother   . Bipolar disorder Mother   . Kidney disease Father   . Heart disease Father   . Hypertension Father   . Diabetes Father   . Stroke Father   . Colon cancer Father     colon cancer  . Anxiety disorder Father   . Kidney disease Sister   . Thyroid nodules  Sister   . Hypertension Sister   . Hypertension Sister   . Diabetes Sister   . Hyperlipidemia Sister   . Depression Sister    Social History:  Social History   Social History  . Marital Status: Married    Spouse Name: N/A  . Number of Children: 1  . Years of Education: N/A   Occupational History  . CUSTODIAN   . Disabled    Social History Main Topics  . Smoking status: Former Smoker    Types: Cigarettes    Quit date: 08/31/1994  . Smokeless tobacco: Never Used     Comment: quit 28 years ago  . Alcohol Use: No  . Drug Use: No  . Sexual Activity: Not Currently    Birth Control/ Protection: None   Other Topics Concern  . None   Social History Narrative   previously did custolial work. 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 Disabled through 08/02/2013 per forms 02/03/13    Additional History:   Assessment:   Musculoskeletal: Strength & Muscle Tone: within normal limits Gait & Station: normal Patient leans: N/A  Psychiatric Specialty Exam: HPI  Review of Systems  Psychiatric/Behavioral: Positive for depression (States that this is improved). Negative for suicidal ideas, hallucinations, memory loss and substance abuse. The patient is not nervous/anxious and does not have insomnia.     Blood pressure 134/86, pulse 104, temperature 97.7 F (36.5 C), temperature source Tympanic, height 5\' 3"  (1.6 m), weight 177 lb 6.4 oz (80.468 kg), SpO2 98 %.Body mass index is 31.43 kg/(m^2).  General Appearance: Neat and Well Groomed  Eye Contact:  Good  Speech:  Normal Rate  Volume:  Normal  Mood:  Better  Affect:  Constricted  Thought Process:  Linear and Logical  Orientation:  Full (Time, Place, and Person)  Thought Content:  Negative  Suicidal Thoughts:  No  Homicidal Thoughts:  No  Memory:  Immediate;   Good Recent;   Good Remote;   Good  Judgement:  Good  Insight:  Good  Psychomotor Activity:  Negative  Concentration:  Good  Recall:  Good  Fund of Knowledge: Good  Language: Good  Akathisia:  Negative  Handed:  Right  AIMS (if indicated):  Done on 3/16 and was normal  Assets:  Communication Skills Desire for Improvement Social Support  ADL's:  Intact  Cognition: WNL  Sleep:  At times too much   Is the patient at risk to self?  No. Has the patient been a risk to self in the past 6 months?  No. Has the patient been a risk to self within the distant past?  Yes.   November 2015 Is the patient a risk to others?  No. Has the patient been a risk to others in the past 6 months?  No. Has the patient been a risk to others within the distant past?  No.  Current Medications: Current Outpatient Prescriptions  Medication Sig Dispense Refill  . aspirin 325 MG EC tablet Take 325 mg by mouth daily.    . baclofen (LIORESAL) 10 MG tablet Take 10 mg by  mouth 3 (three) times daily.    . Brexpiprazole (REXULTI) 1 MG TABS Take 1 mg by mouth every morning. 30 tablet 1  . Brexpiprazole 2 MG TABS Take 2 mg by mouth daily. One 2 mg tablet daily. 30 tablet 0  . Canagliflozin 300 MG TABS Take 300 mg by mouth daily.    . carvedilol (COREG)  12.5 MG tablet TAKE ONE TABLET BY MOUTH TWICE DAILY 60 tablet 12  . chlorthalidone (HYGROTON) 25 MG tablet TAKE ONE TABLET BY MOUTH ONCE DAILY 30 tablet 6  . citalopram (CELEXA) 40 MG tablet Take 40 mg by mouth daily.    . cloNIDine (CATAPRES) 0.2 MG tablet Take 0.2 mg by mouth daily.   5  . glucose blood (ONE TOUCH ULTRA TEST) test strip 1 each by Other route. Use as instructed  Up to 6 times daily     . hydrochlorothiazide (HYDRODIURIL) 25 MG tablet Take 25 mg by mouth daily.     . insulin aspart (NOVOLOG) 100 UNIT/ML injection Inject 4-15 Units into the skin 3 (three) times daily as needed for high blood sugar. Takes anywhere from 4-15 units as directed per sliding scale    . insulin detemir (LEVEMIR) 100 UNIT/ML injection Inject 0.25 mLs (25 Units total) into the skin as needed. 10 mL 1  . levothyroxine (SYNTHROID, LEVOTHROID) 25 MCG tablet TAKE ONE TABLET BY MOUTH ONCE DAILY 30 tablet 12  . metFORMIN (GLUCOPHAGE) 1000 MG tablet Take 1,000 mg by mouth 2 (two) times daily with a meal.     . pantoprazole (PROTONIX) 40 MG tablet Take 80 mg by mouth daily.    . temazepam (RESTORIL) 15 MG capsule Take 1 capsule (15 mg total) by mouth at bedtime as needed. for sleep 30 capsule 3   Current Facility-Administered Medications  Medication Dose Route Frequency Provider Last Rate Last Dose  . methylPREDNISolone sodium succinate (SOLU-MEDROL) 500 mg in sodium chloride 0.9 % 100 mL IVPB  500 mg Intravenous Continuous Marcial Pacas, MD 0 mL/hr at 06/07/12 1600 250 mg at 06/07/12 1836  . valproate (DEPACON) 500 mg in sodium chloride 0.9 % 100 mL IVPB  500 mg Intravenous Continuous Marcial Pacas, MD 105 mL/hr at 06/07/12 1450 500 mg at  06/07/12 1450    Medical Decision Making:  Established Problem, Worsening (2), Review of Medication Regimen & Side Effects (2) and Review of New Medication or Change in Dosage (2)  Treatment Plan Summary:Medication management and Plan We will continue the patient's citalopram at 40 mg a day. I have told her to refrain from the to temazepam as much. We will continue the ricks salty at 2 mg daily. Patient feels better on this regimen. She'll follow up in 2 months. She's been encouraged colony questions concerns prior to her next point.   Faith Rogue 10/02/2014, 11:32 AM

## 2014-10-12 IMAGING — CR DG ABDOMEN 2V
1 series · 3 of 3 positions shown · non-contrast
Comparison: none

REASON FOR EXAM: pain
COMMENTS:

PROCEDURE:     DXR - DXR ABDOMEN 2 V FLAT AND ERECT  - October 29, 2012  [DATE]
RESULT:     History: Pain.
Comparison Study: CT abdomen 02/15/2012.

[Series 1: w abdomen upright · 0.14mm/px · 3 of 3 slices shown]
[im 1/3]
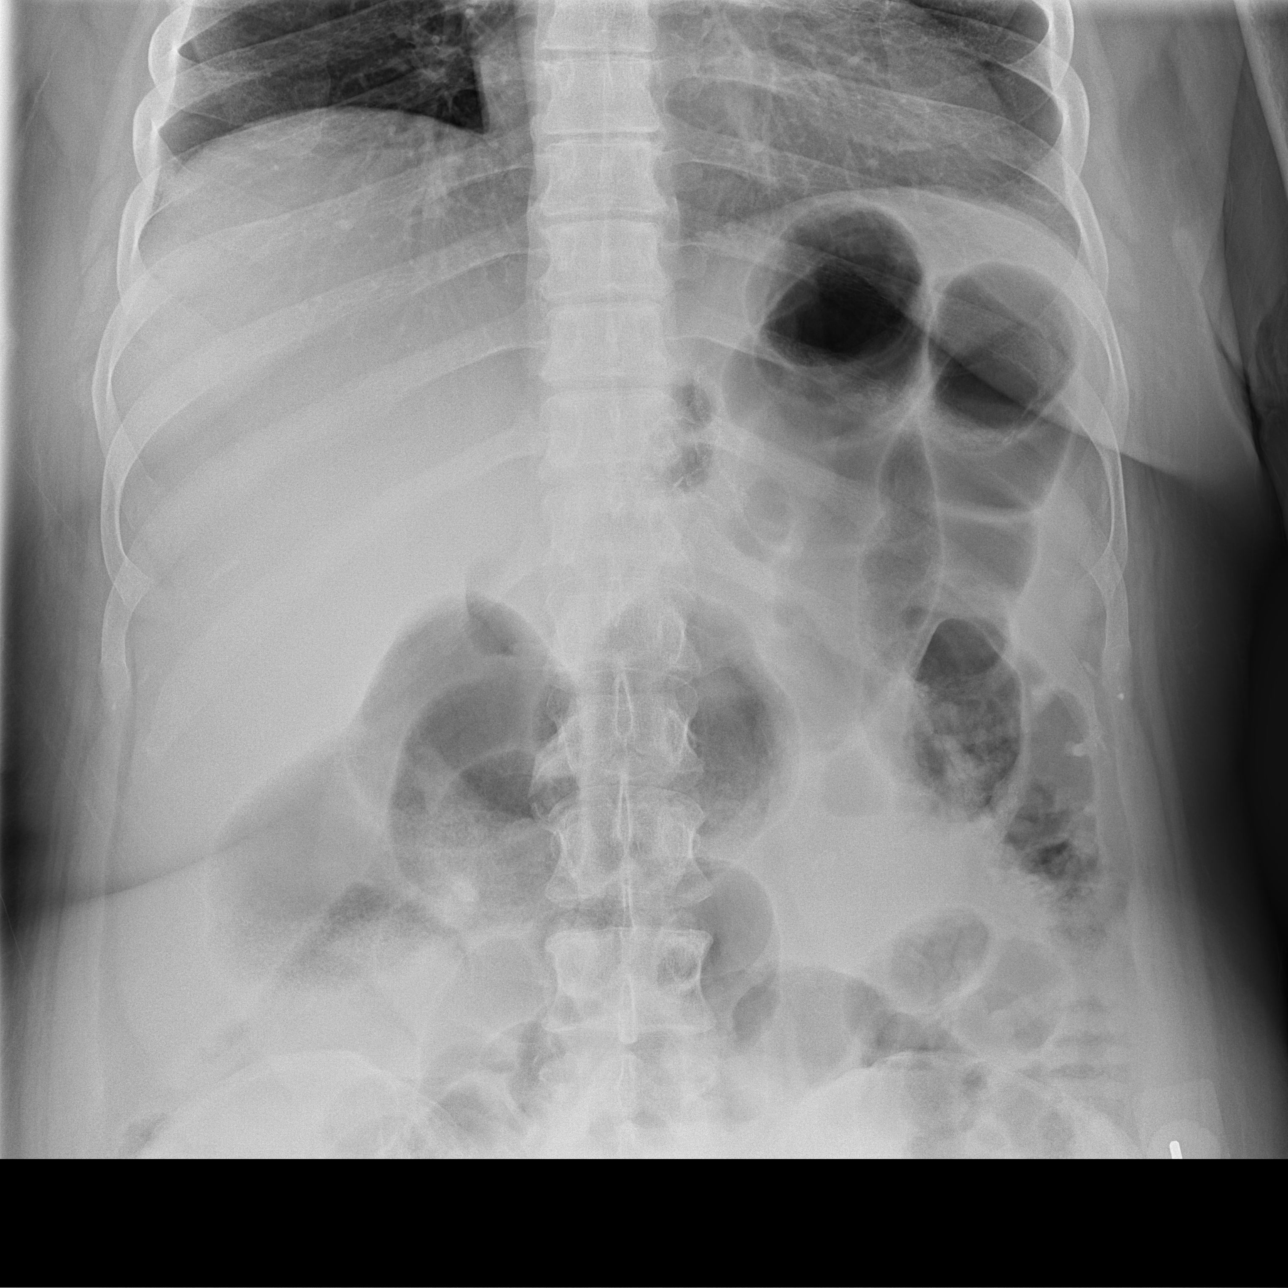
[im 2/3]
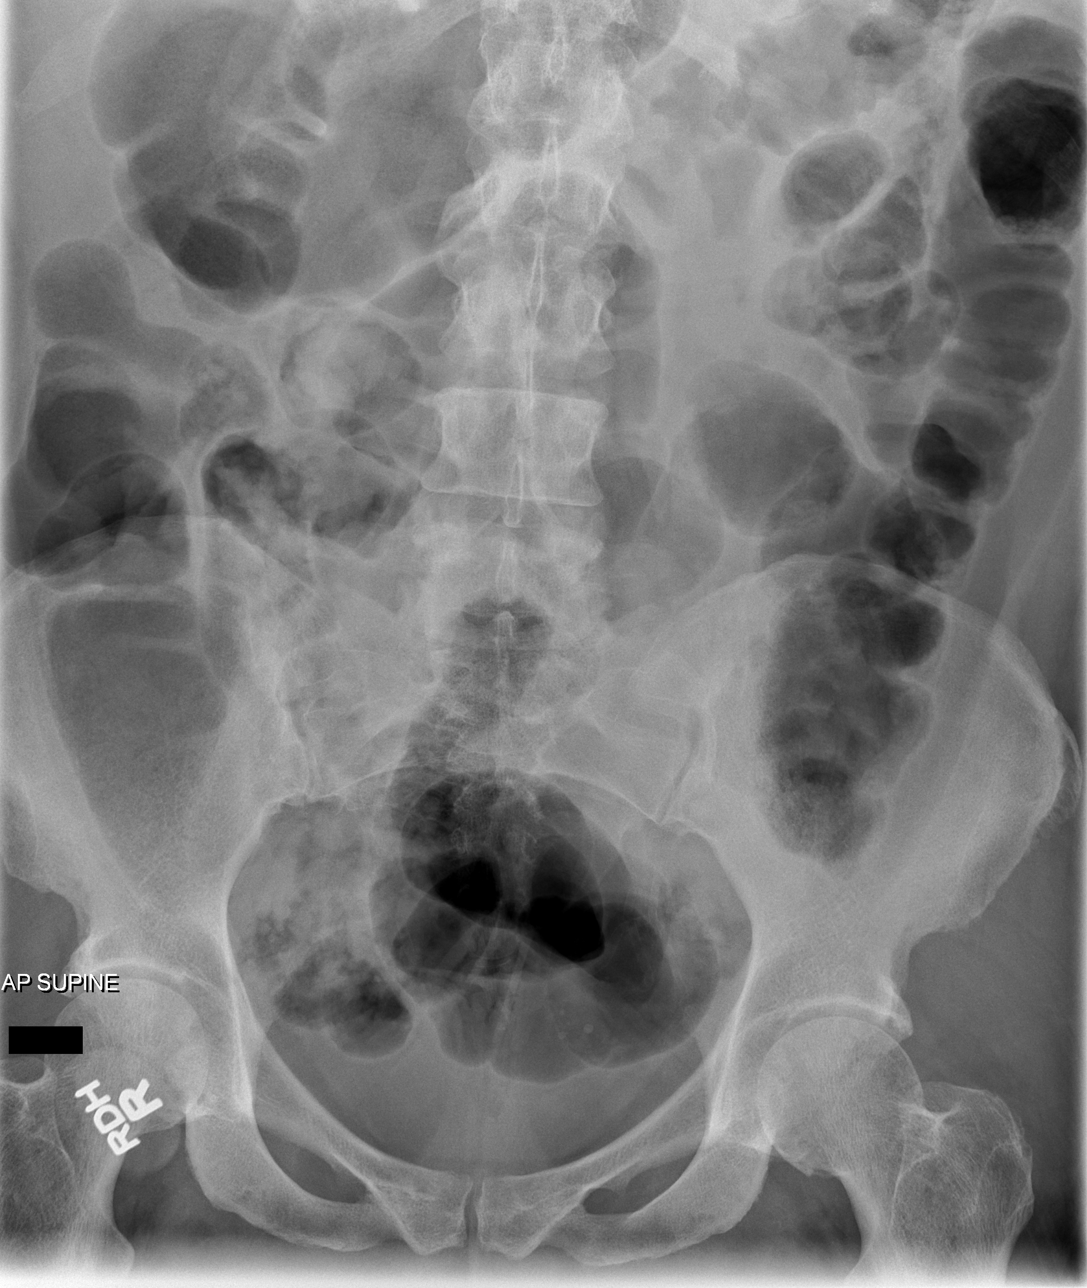
[im 3/3]
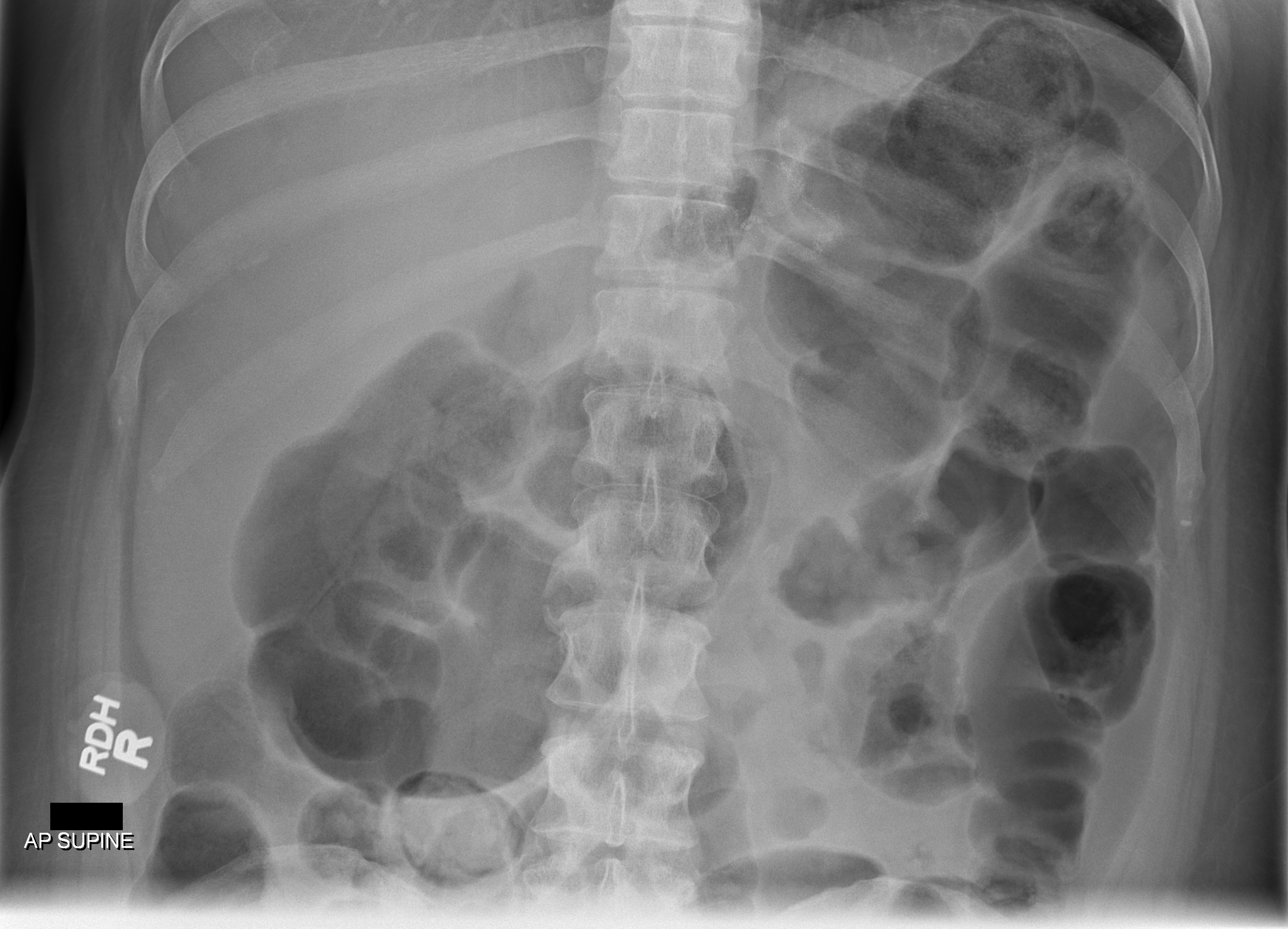

[3 of 3 positions shown; findings below may reference images not displayed]

FINDINGS: Soft tissue structures are unremarkable. Surgical sutures noted in
the upper abdomen. Mildly distended air-filled loops of small and large
bowel noted consistent with adynamic ileus. Calcification in pelvis most
consistent with phleboliths. Degenerative changes lumbar spine and both
hips. No free air.
IMPRESSION: Findings suggesting adynamic ileus.

## 2014-10-15 IMAGING — CR DG CHEST 2V
1 series · 2 of 2 positions shown · non-contrast
Comparison: none

REASON FOR EXAM: Chest Pain
COMMENTS:

PROCEDURE:     DXR - DXR CHEST PA (OR AP) AND LATERAL  - November 01, 2012  [DATE]
RESULT:     The lungs are clear. The cardiac silhouette and visualized bony
skeleton are unremarkable.

[Series 1: pa · 0.17mm/px · 2 of 2 slices shown]
[im 1/2]
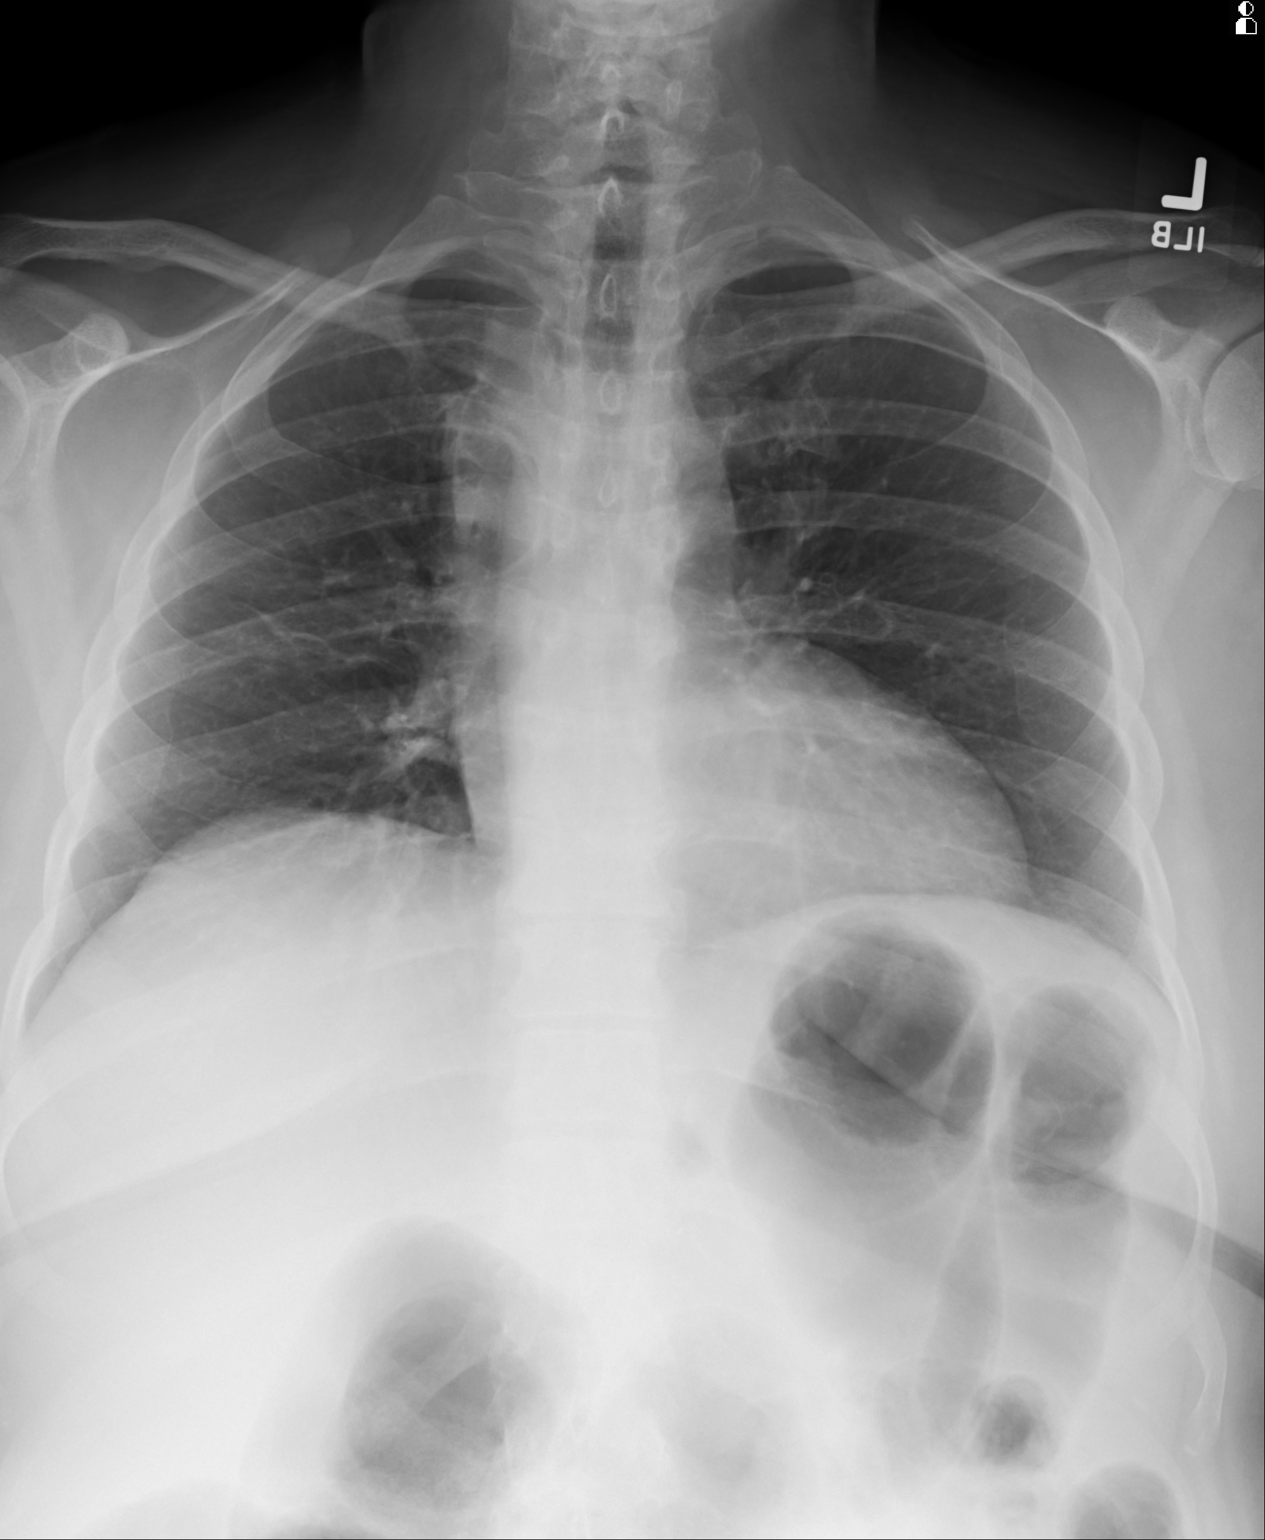
[im 2/2]
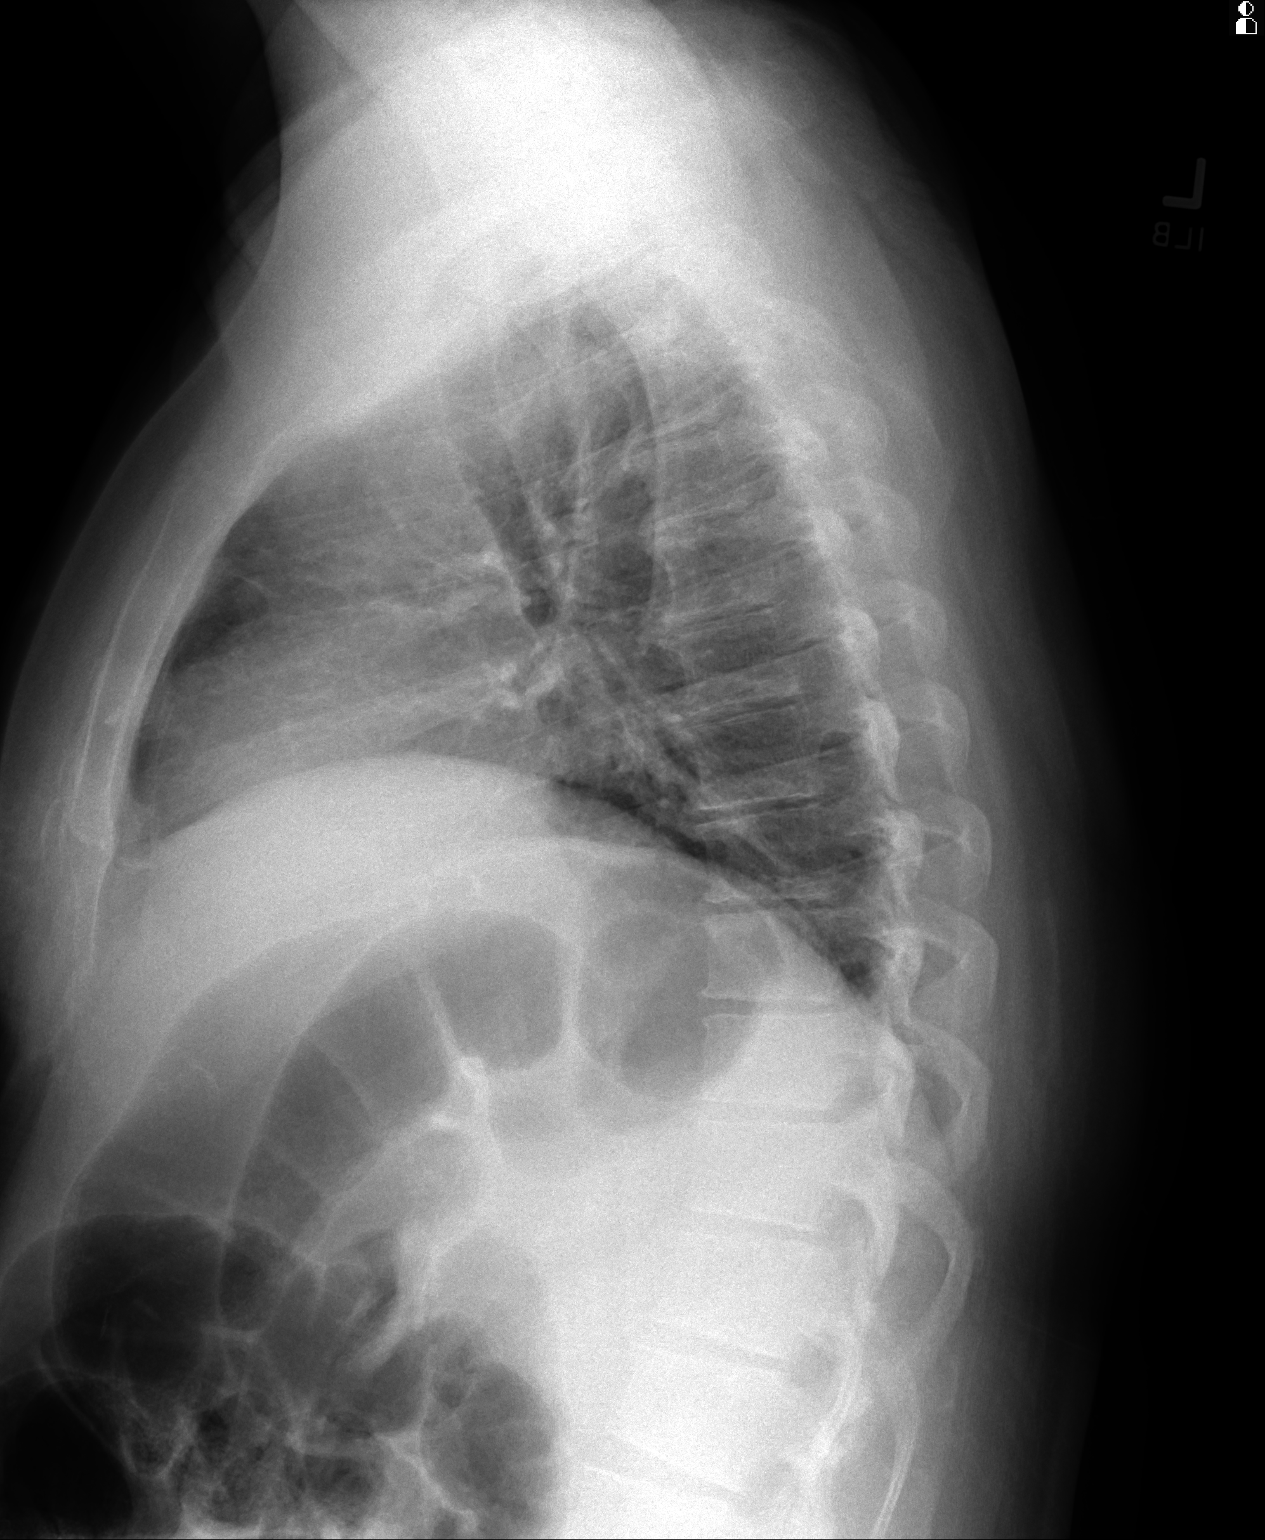

[2 of 2 positions shown; findings below may reference images not displayed]

IMPRESSION: 1. Chest radiograph without evidence of acute cardiopulmonary disease.
Comparison to prior study dated 02/10/2012.

## 2014-10-24 ENCOUNTER — Other Ambulatory Visit: Payer: Self-pay | Admitting: Family Medicine

## 2014-10-25 IMAGING — CR RIGHT KNEE - 3 VIEW
1 series · 3 of 3 positions shown · non-contrast
Comparison: none

REASON FOR EXAM: knee pain
COMMENTS:

PROCEDURE:     KDR - KDXR KNEE RT- 3 VIEWS ONLY  - November 11, 2012  [DATE]
RESULT:     Comparison:  None

[Series 1: ap · 0.17mm/px · 3 of 3 slices shown]
[im 1/3]
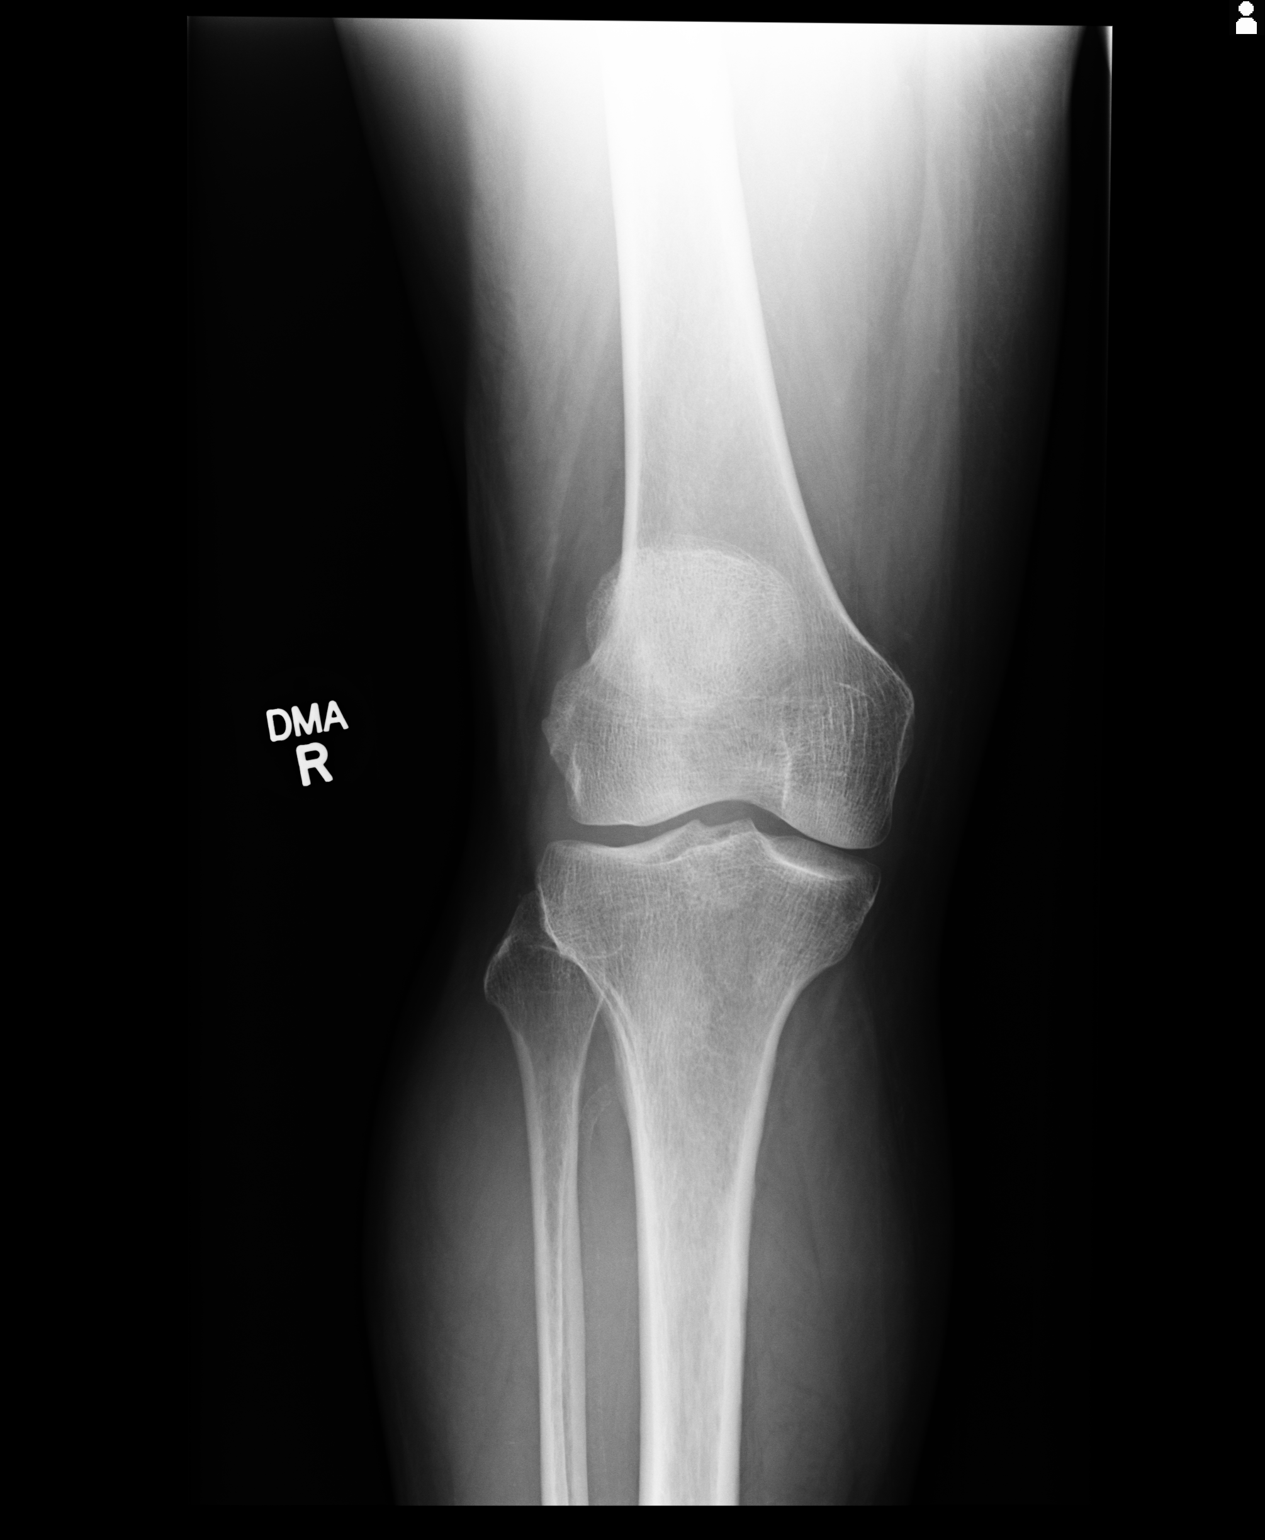
[im 2/3]
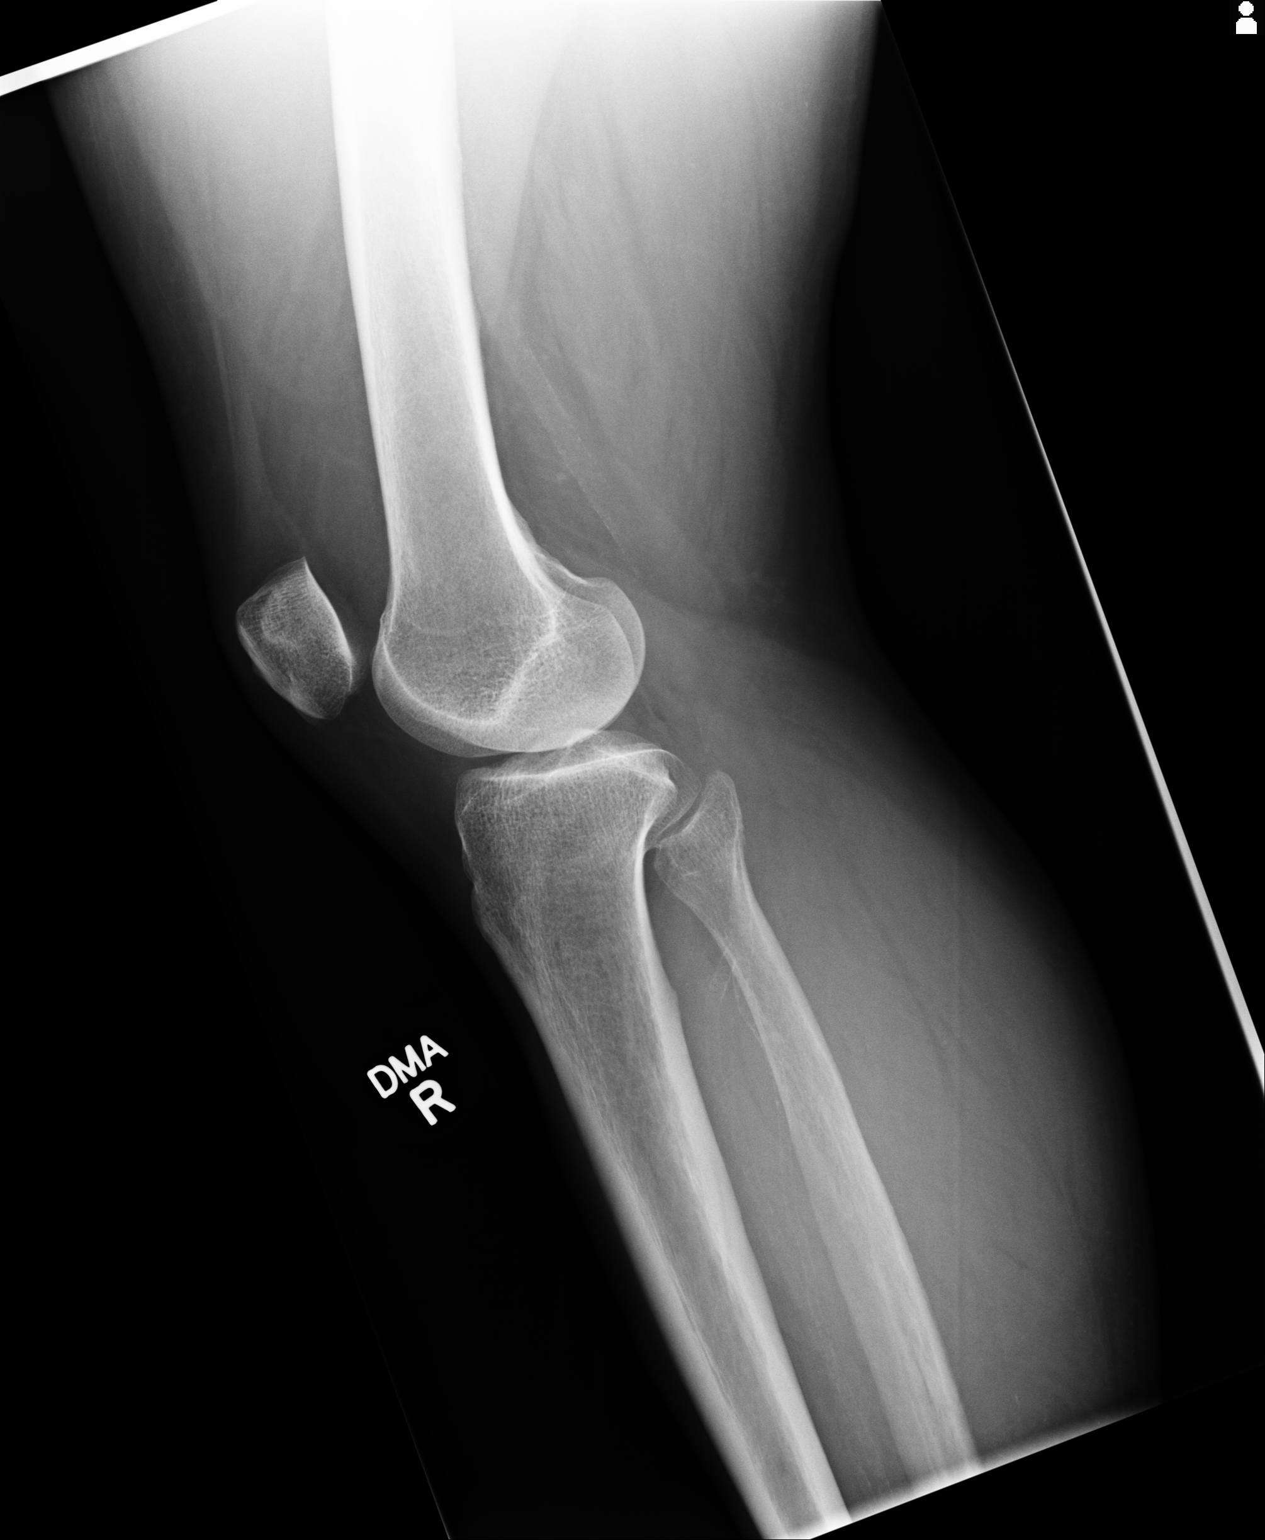
[im 3/3]
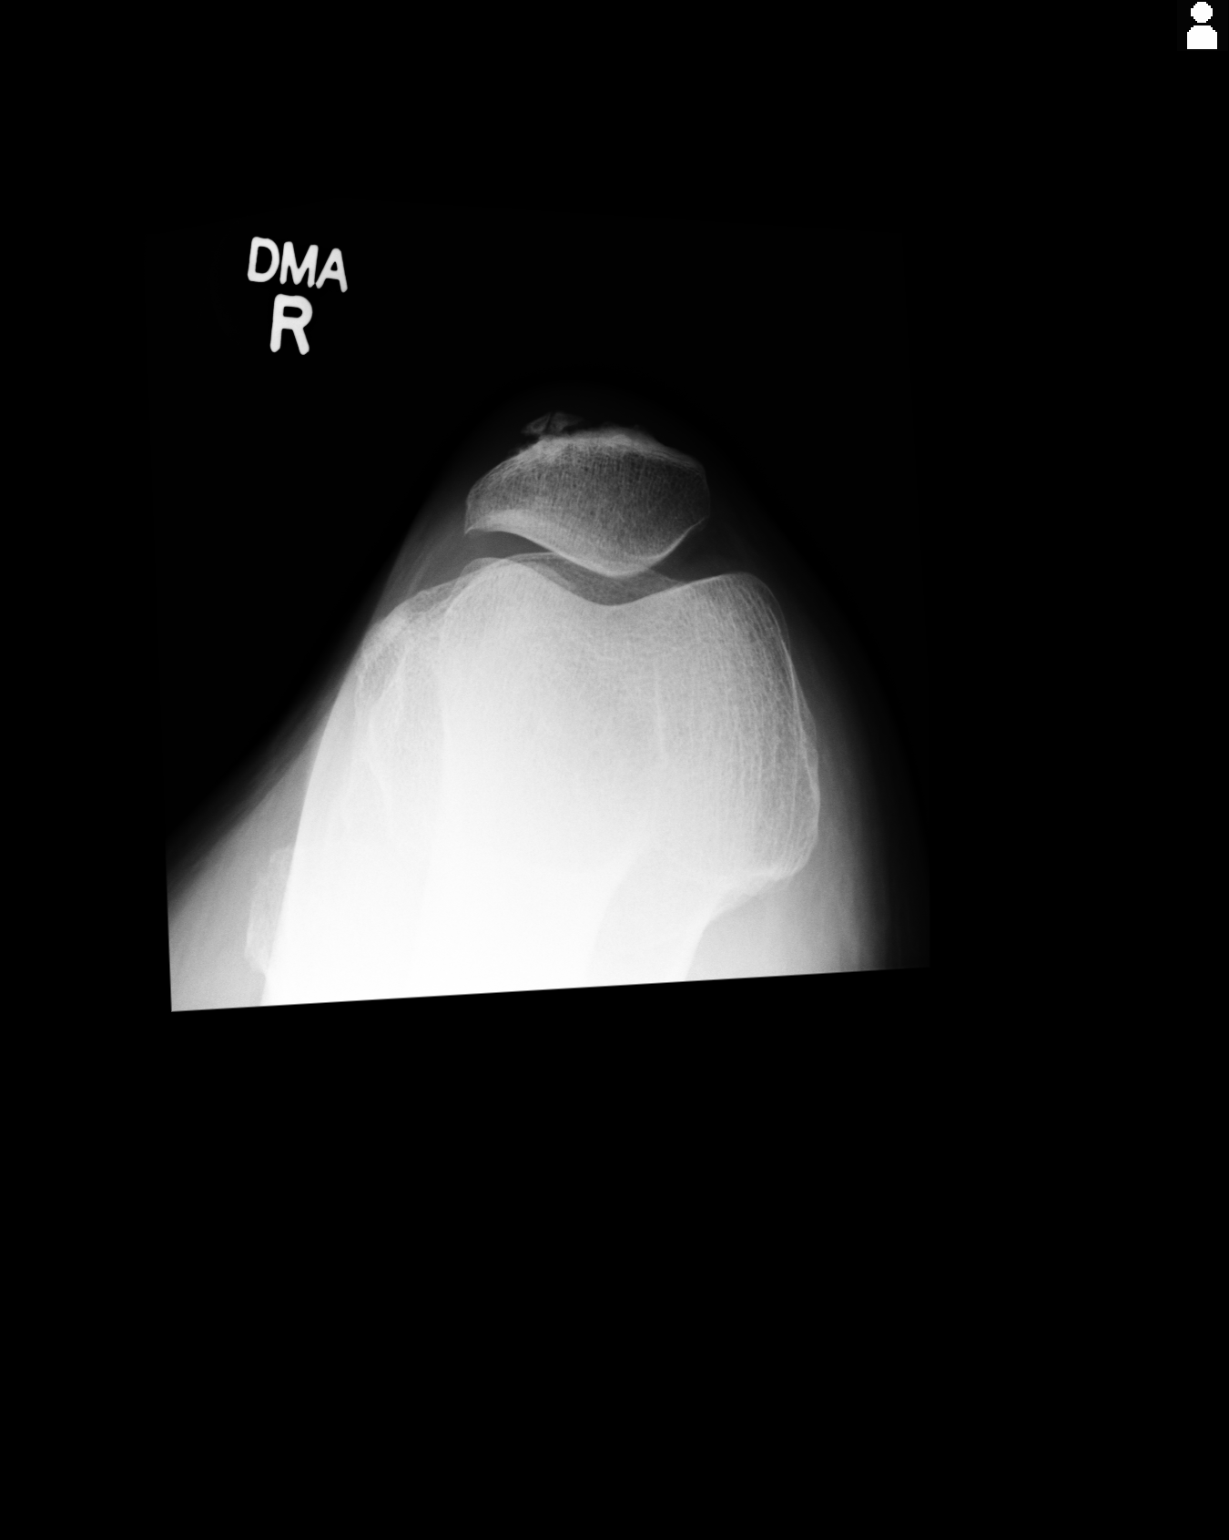

[3 of 3 positions shown; findings below may reference images not displayed]

FINDINGS: 3 views of the right knee demonstrates no acute fracture or dislocation.
There is no significant joint effusion.
IMPRESSION: No acute osseous injury of the right knee.

[REDACTED]

## 2014-11-04 ENCOUNTER — Other Ambulatory Visit: Payer: Self-pay | Admitting: Family Medicine

## 2014-11-04 NOTE — Telephone Encounter (Signed)
Please call in temazepam 

## 2014-11-05 ENCOUNTER — Other Ambulatory Visit: Payer: Self-pay | Admitting: Family Medicine

## 2014-11-06 ENCOUNTER — Other Ambulatory Visit: Payer: Self-pay | Admitting: Family Medicine

## 2014-11-06 NOTE — Telephone Encounter (Signed)
Pt called back saying the pharmacy told her this has not ok'd for a refill yet.  Pt's call back is    [phone]  Thanks Con Memos

## 2014-11-06 NOTE — Telephone Encounter (Signed)
Rx called in to pharmacy. 

## 2014-11-07 ENCOUNTER — Inpatient Hospital Stay
Admission: EM | Admit: 2014-11-07 | Discharge: 2014-11-13 | DRG: 247 | Disposition: A | Discharge status: Home / Self Care | Payer: BC Managed Care – PPO | Attending: Internal Medicine | Admitting: Internal Medicine

## 2014-11-07 ENCOUNTER — Other Ambulatory Visit: Payer: Self-pay

## 2014-11-07 ENCOUNTER — Encounter: Payer: Self-pay | Admitting: Cardiovascular Disease

## 2014-11-07 ENCOUNTER — Encounter: Payer: Self-pay | Admitting: *Deleted

## 2014-11-07 ENCOUNTER — Emergency Department: Payer: BC Managed Care – PPO

## 2014-11-07 DIAGNOSIS — I129 Hypertensive chronic kidney disease with stage 1 through stage 4 chronic kidney disease, or unspecified chronic kidney disease: Secondary | ICD-10-CM | POA: Diagnosis present

## 2014-11-07 DIAGNOSIS — F329 Major depressive disorder, single episode, unspecified: Secondary | ICD-10-CM | POA: Diagnosis present

## 2014-11-07 DIAGNOSIS — M5416 Radiculopathy, lumbar region: Secondary | ICD-10-CM | POA: Diagnosis present

## 2014-11-07 DIAGNOSIS — F419 Anxiety disorder, unspecified: Secondary | ICD-10-CM | POA: Diagnosis present

## 2014-11-07 DIAGNOSIS — I5022 Chronic systolic (congestive) heart failure: Secondary | ICD-10-CM | POA: Diagnosis present

## 2014-11-07 DIAGNOSIS — Z6832 Body mass index (BMI) 32.0-32.9, adult: Secondary | ICD-10-CM

## 2014-11-07 DIAGNOSIS — N184 Chronic kidney disease, stage 4 (severe): Secondary | ICD-10-CM | POA: Diagnosis present

## 2014-11-07 DIAGNOSIS — Z7984 Long term (current) use of oral hypoglycemic drugs: Secondary | ICD-10-CM

## 2014-11-07 DIAGNOSIS — E1122 Type 2 diabetes mellitus with diabetic chronic kidney disease: Secondary | ICD-10-CM | POA: Diagnosis present

## 2014-11-07 DIAGNOSIS — G43909 Migraine, unspecified, not intractable, without status migrainosus: Secondary | ICD-10-CM | POA: Diagnosis present

## 2014-11-07 DIAGNOSIS — D509 Iron deficiency anemia, unspecified: Secondary | ICD-10-CM | POA: Diagnosis present

## 2014-11-07 DIAGNOSIS — E039 Hypothyroidism, unspecified: Secondary | ICD-10-CM | POA: Diagnosis present

## 2014-11-07 DIAGNOSIS — R079 Chest pain, unspecified: Secondary | ICD-10-CM | POA: Diagnosis not present

## 2014-11-07 DIAGNOSIS — Z87891 Personal history of nicotine dependence: Secondary | ICD-10-CM

## 2014-11-07 DIAGNOSIS — Z7902 Long term (current) use of antithrombotics/antiplatelets: Secondary | ICD-10-CM

## 2014-11-07 DIAGNOSIS — E785 Hyperlipidemia, unspecified: Secondary | ICD-10-CM | POA: Diagnosis present

## 2014-11-07 DIAGNOSIS — Z8673 Personal history of transient ischemic attack (TIA), and cerebral infarction without residual deficits: Secondary | ICD-10-CM

## 2014-11-07 DIAGNOSIS — I25111 Atherosclerotic heart disease of native coronary artery with angina pectoris with documented spasm: Secondary | ICD-10-CM | POA: Diagnosis not present

## 2014-11-07 DIAGNOSIS — I050 Rheumatic mitral stenosis: Secondary | ICD-10-CM | POA: Diagnosis present

## 2014-11-07 DIAGNOSIS — Z85820 Personal history of malignant melanoma of skin: Secondary | ICD-10-CM

## 2014-11-07 DIAGNOSIS — E11319 Type 2 diabetes mellitus with unspecified diabetic retinopathy without macular edema: Secondary | ICD-10-CM | POA: Diagnosis present

## 2014-11-07 DIAGNOSIS — K219 Gastro-esophageal reflux disease without esophagitis: Secondary | ICD-10-CM | POA: Diagnosis present

## 2014-11-07 DIAGNOSIS — Z7982 Long term (current) use of aspirin: Secondary | ICD-10-CM

## 2014-11-07 DIAGNOSIS — Z794 Long term (current) use of insulin: Secondary | ICD-10-CM

## 2014-11-07 DIAGNOSIS — R197 Diarrhea, unspecified: Secondary | ICD-10-CM | POA: Diagnosis not present

## 2014-11-07 DIAGNOSIS — Z888 Allergy status to other drugs, medicaments and biological substances status: Secondary | ICD-10-CM

## 2014-11-07 DIAGNOSIS — I429 Cardiomyopathy, unspecified: Secondary | ICD-10-CM | POA: Diagnosis present

## 2014-11-07 DIAGNOSIS — D631 Anemia in chronic kidney disease: Secondary | ICD-10-CM | POA: Diagnosis present

## 2014-11-07 DIAGNOSIS — E1140 Type 2 diabetes mellitus with diabetic neuropathy, unspecified: Secondary | ICD-10-CM | POA: Diagnosis present

## 2014-11-07 DIAGNOSIS — I248 Other forms of acute ischemic heart disease: Secondary | ICD-10-CM | POA: Diagnosis present

## 2014-11-07 DIAGNOSIS — I209 Angina pectoris, unspecified: Secondary | ICD-10-CM

## 2014-11-07 DIAGNOSIS — Z79899 Other long term (current) drug therapy: Secondary | ICD-10-CM

## 2014-11-07 DIAGNOSIS — Z9884 Bariatric surgery status: Secondary | ICD-10-CM

## 2014-11-07 DIAGNOSIS — E1169 Type 2 diabetes mellitus with other specified complication: Secondary | ICD-10-CM | POA: Diagnosis present

## 2014-11-07 DIAGNOSIS — I69354 Hemiplegia and hemiparesis following cerebral infarction affecting left non-dominant side: Secondary | ICD-10-CM

## 2014-11-07 DIAGNOSIS — E1121 Type 2 diabetes mellitus with diabetic nephropathy: Secondary | ICD-10-CM | POA: Diagnosis present

## 2014-11-07 DIAGNOSIS — E669 Obesity, unspecified: Secondary | ICD-10-CM | POA: Diagnosis present

## 2014-11-07 DIAGNOSIS — Z8711 Personal history of peptic ulcer disease: Secondary | ICD-10-CM

## 2014-11-07 DIAGNOSIS — Z86010 Personal history of colonic polyps: Secondary | ICD-10-CM

## 2014-11-07 DIAGNOSIS — G35 Multiple sclerosis: Secondary | ICD-10-CM | POA: Diagnosis present

## 2014-11-07 DIAGNOSIS — M4802 Spinal stenosis, cervical region: Secondary | ICD-10-CM | POA: Diagnosis present

## 2014-11-07 HISTORY — DX: Chronic kidney disease, stage 3 (moderate): N18.3

## 2014-11-07 LAB — BASIC METABOLIC PANEL
Anion gap: 8 (ref 5–15)
BUN: 24 mg/dL — AB (ref 6–20)
CO2: 20 mmol/L — ABNORMAL LOW (ref 22–32)
CREATININE: 2 mg/dL — AB (ref 0.44–1.00)
Calcium: 8.4 mg/dL — ABNORMAL LOW (ref 8.9–10.3)
Chloride: 106 mmol/L (ref 101–111)
GFR calc Af Amer: 33 mL/min — ABNORMAL LOW (ref >60)
GFR calc non Af Amer: 29 mL/min — ABNORMAL LOW (ref >60)
Glucose, Bld: 234 mg/dL — ABNORMAL HIGH (ref 65–99)
Potassium: 3.9 mmol/L (ref 3.5–5.1)
SODIUM: 134 mmol/L — AB (ref 135–145)

## 2014-11-07 LAB — CBC
HCT: 36.1 % (ref 35.0–47.0)
Hemoglobin: 11.8 g/dL — ABNORMAL LOW (ref 12.0–16.0)
MCH: 25.8 pg — ABNORMAL LOW (ref 26.0–34.0)
MCHC: 32.8 g/dL (ref 32.0–36.0)
MCV: 78.7 fL — ABNORMAL LOW (ref 80.0–100.0)
PLATELETS: 293 10*3/uL (ref 150–440)
RBC: 4.58 MIL/uL (ref 3.80–5.20)
RDW: 14.6 % — AB (ref 11.5–14.5)
WBC: 10.4 10*3/uL (ref 3.6–11.0)

## 2014-11-07 LAB — GLUCOSE, CAPILLARY: Glucose-Capillary: 134 mg/dL — ABNORMAL HIGH (ref 65–99)

## 2014-11-07 LAB — TROPONIN I: TROPONIN I: 0.04 ng/mL — AB (ref <0.031)

## 2014-11-07 MED ORDER — ASPIRIN EC 81 MG PO TBEC
81.0000 mg | DELAYED_RELEASE_TABLET | Freq: Every day | ORAL | Status: DC
  Administered 2014-11-08 – 2014-11-11 (×4): 81 mg via ORAL
  Filled 2014-11-07 (×4): qty 1

## 2014-11-07 MED ORDER — PANTOPRAZOLE SODIUM 40 MG PO TBEC
40.0000 mg | DELAYED_RELEASE_TABLET | Freq: Two times a day (BID) | ORAL | Status: DC
  Administered 2014-11-07 – 2014-11-13 (×12): 40 mg via ORAL
  Filled 2014-11-07 (×12): qty 1

## 2014-11-07 MED ORDER — NITROGLYCERIN 2 % TD OINT
1.0000 [in_us] | TOPICAL_OINTMENT | Freq: Once | TRANSDERMAL | Status: AC
Start: 2014-11-07 — End: 2014-11-07
  Administered 2014-11-07: 1 [in_us] via TOPICAL

## 2014-11-07 MED ORDER — ONDANSETRON HCL 4 MG PO TABS: 4.0000 mg | ORAL_TABLET | Freq: Four times a day (QID) | ORAL | Status: DC | PRN

## 2014-11-07 MED ORDER — CLOPIDOGREL BISULFATE 75 MG PO TABS
75.0000 mg | ORAL_TABLET | Freq: Every day | ORAL | Status: DC
  Administered 2014-11-08 – 2014-11-11 (×4): 75 mg via ORAL
  Filled 2014-11-07 (×4): qty 1

## 2014-11-07 MED ORDER — NITROGLYCERIN 2 % TD OINT
1.0000 [in_us] | TOPICAL_OINTMENT | Freq: Once | TRANSDERMAL | Status: AC
Start: 2014-11-07 — End: 2014-11-07
  Administered 2014-11-07: 1 [in_us] via TOPICAL
  Filled 2014-11-07: qty 1

## 2014-11-07 MED ORDER — BACLOFEN 10 MG PO TABS: 10.0000 mg | ORAL_TABLET | Freq: Three times a day (TID) | ORAL | Status: DC | PRN

## 2014-11-07 MED ORDER — CITALOPRAM HYDROBROMIDE 20 MG PO TABS
40.0000 mg | ORAL_TABLET | Freq: Every day | ORAL | Status: DC
  Administered 2014-11-08 – 2014-11-13 (×6): 40 mg via ORAL
  Filled 2014-11-07 (×7): qty 2

## 2014-11-07 MED ORDER — CARVEDILOL 12.5 MG PO TABS
12.5000 mg | ORAL_TABLET | Freq: Two times a day (BID) | ORAL | Status: DC
  Administered 2014-11-07: 12.5 mg via ORAL
  Filled 2014-11-07 (×2): qty 1

## 2014-11-07 MED ORDER — MORPHINE SULFATE (PF) 2 MG/ML IV SOLN
2.0000 mg | INTRAVENOUS | Status: DC | PRN
  Administered 2014-11-07 – 2014-11-12 (×9): 2 mg via INTRAVENOUS
  Filled 2014-11-07 (×9): qty 1

## 2014-11-07 MED ORDER — BREXPIPRAZOLE 2 MG PO TABS: 2.0000 mg | ORAL_TABLET | Freq: Every day | ORAL | Status: DC

## 2014-11-07 MED ORDER — ACETAMINOPHEN 325 MG PO TABS
650.0000 mg | ORAL_TABLET | Freq: Four times a day (QID) | ORAL | Status: DC | PRN
  Administered 2014-11-10 – 2014-11-11 (×6): 650 mg via ORAL
  Filled 2014-11-07 (×7): qty 2

## 2014-11-07 MED ORDER — CANAGLIFLOZIN 300 MG PO TABS
300.0000 mg | ORAL_TABLET | Freq: Every day | ORAL | Status: DC
  Administered 2014-11-08: 300 mg via ORAL
  Filled 2014-11-07 (×3): qty 300

## 2014-11-07 MED ORDER — LEVOTHYROXINE SODIUM 50 MCG PO TABS
25.0000 ug | ORAL_TABLET | Freq: Every day | ORAL | Status: DC
  Administered 2014-11-08 – 2014-11-13 (×6): 25 ug via ORAL
  Filled 2014-11-07 (×6): qty 1

## 2014-11-07 MED ORDER — SODIUM CHLORIDE 0.9 % IJ SOLN
3.0000 mL | Freq: Two times a day (BID) | INTRAMUSCULAR | Status: DC
  Administered 2014-11-07 – 2014-11-12 (×10): 3 mL via INTRAVENOUS

## 2014-11-07 MED ORDER — TEMAZEPAM 15 MG PO CAPS
15.0000 mg | ORAL_CAPSULE | Freq: Every day | ORAL | Status: DC
  Administered 2014-11-07 – 2014-11-12 (×6): 15 mg via ORAL
  Filled 2014-11-07 (×6): qty 1

## 2014-11-07 MED ORDER — ENALAPRIL MALEATE 5 MG PO TABS
5.0000 mg | ORAL_TABLET | Freq: Every day | ORAL | Status: DC
  Administered 2014-11-09: 5 mg via ORAL
  Filled 2014-11-07 (×2): qty 1

## 2014-11-07 MED ORDER — ONDANSETRON HCL 4 MG/2ML IJ SOLN: 4.0000 mg | Freq: Four times a day (QID) | INTRAMUSCULAR | Status: DC | PRN

## 2014-11-07 MED ORDER — ENOXAPARIN SODIUM 40 MG/0.4ML ~~LOC~~ SOLN
40.0000 mg | SUBCUTANEOUS | Status: DC
  Administered 2014-11-07 – 2014-11-08 (×2): 40 mg via SUBCUTANEOUS
  Filled 2014-11-07 (×2): qty 0.4

## 2014-11-07 MED ORDER — INSULIN ASPART 100 UNIT/ML ~~LOC~~ SOLN
0.0000 [IU] | Freq: Every day | SUBCUTANEOUS | Status: DC
  Administered 2014-11-11 – 2014-11-12 (×2): 2 [IU] via SUBCUTANEOUS
  Filled 2014-11-07 (×2): qty 2

## 2014-11-07 MED ORDER — GABAPENTIN 300 MG PO CAPS
300.0000 mg | ORAL_CAPSULE | Freq: Every day | ORAL | Status: DC
  Administered 2014-11-07 – 2014-11-12 (×6): 300 mg via ORAL
  Filled 2014-11-07 (×5): qty 1

## 2014-11-07 MED ORDER — PROMETHAZINE HCL 25 MG PO TABS: 25.0000 mg | ORAL_TABLET | Freq: Four times a day (QID) | ORAL | Status: DC | PRN

## 2014-11-07 MED ORDER — ACETAMINOPHEN 650 MG RE SUPP: 650.0000 mg | Freq: Four times a day (QID) | RECTAL | Status: DC | PRN

## 2014-11-07 MED ORDER — INSULIN ASPART 100 UNIT/ML ~~LOC~~ SOLN
0.0000 [IU] | Freq: Three times a day (TID) | SUBCUTANEOUS | Status: DC
  Administered 2014-11-08: 2 [IU] via SUBCUTANEOUS
  Administered 2014-11-09 – 2014-11-10 (×2): 3 [IU] via SUBCUTANEOUS
  Administered 2014-11-10 – 2014-11-12 (×4): 2 [IU] via SUBCUTANEOUS
  Administered 2014-11-12: 3 [IU] via SUBCUTANEOUS
  Filled 2014-11-07 (×2): qty 2
  Filled 2014-11-07 (×3): qty 3
  Filled 2014-11-07 (×3): qty 2

## 2014-11-07 MED ORDER — GABAPENTIN 300 MG PO CAPS
600.0000 mg | ORAL_CAPSULE | Freq: Every morning | ORAL | Status: DC
  Administered 2014-11-08 – 2014-11-13 (×6): 600 mg via ORAL
  Filled 2014-11-07 (×7): qty 2

## 2014-11-07 MED ORDER — ASPIRIN 81 MG PO CHEW
243.0000 mg | CHEWABLE_TABLET | Freq: Once | ORAL | Status: AC
  Administered 2014-11-07: 243 mg via ORAL
  Filled 2014-11-07: qty 3

## 2014-11-07 MED ORDER — CLONIDINE HCL 0.1 MG PO TABS
0.2000 mg | ORAL_TABLET | Freq: Every day | ORAL | Status: DC
  Administered 2014-11-07: 0.2 mg via ORAL
  Filled 2014-11-07: qty 2

## 2014-11-07 NOTE — H&P (Signed)
[facility] NAME: Moriah Guarnieri    MR#:  [id number]  DATE OF BIRTH:  1970-02-17  DATE OF ADMISSION:  11/07/2014  PRIMARY CARE PHYSICIAN: [name], MD   REQUESTING/REFERRING PHYSICIAN: Dr. Conni Slipper  CHIEF COMPLAINT:   Chief Complaint  Patient presents with  . Chest Pain     HISTORY OF PRESENT ILLNESS:  [address] [name]  is a 44 y.o. female with a known history of coronary disease, history of previous CVA, GERD, anxiety, history of multiple sclerosis, history of mitral stenosis, depression, type 2 diabetes without complication insulin-dependent, CKD Stage III, who presents to the hospital due to chest pain that began yesterday. Patient says she developed substernal chest pressure while she was driving yesterday but resolved by the time she got home. She said it lasted anywhere between a few minutes and then she had a recurrence of her chest pain while she was preparing dinner yesterday evening. Patient took some nitroglycerin and the pain shortly thereafter resolved. She then broke up in the middle night last night having some substernal chest pressure radiating to her left arm. She took some more nitroglycerin which seemed to alleviate her pain and she was a fall back asleep. She called her cardiologist this morning who recommended that she come to the ER for further evaluation. Patient says that she still has some mild substernal chest pain but it's much improved since yesterday. She also admits to shortness of breath, diaphoresis, nausea with the symptoms as mentioned above. Given the recurrence of her symptoms hospitalist services were contacted for further treatment and evaluation.  PAST MEDICAL HISTORY:   Past Medical History  Diagnosis Date  . Colon polyp   . GERD (gastroesophageal reflux disease)   . History of left heart catheterization (LHC)     a. cath 08/09/2003: mild LAD plaque, no obs dzs noted; b. 12/18/10  stress test w/o ischemia.   Marland Kitchen CVA (cerebral vascular accident) ([place])     a. 06/2008-TEE nl LV fxn; b. 01/2013 - notes indicate short run of SVT at that time  . Multiple sclerosis ([place]) 2015  . Syncope   . Chronic systolic CHF (congestive heart failure) ([place])     a. echo 01/29/2013 EF 40-45%, basal anteroseptal & mid anteroseptal segments abnl; b echo 05/2012 EF 50-55%, mildly dilated LA, nl RVSP  . Orthostatic hypotension   . Aortic arch aneurysm ([place])   . Cardiomyopathy ([place])   . MS (mitral stenosis)   . Malignant melanoma of skin of scalp ([place])   . Hypertension   . Depression   . Diabetes mellitus without complication ([place])   . Headache     Migrains  . Hyperlipidemia   . Renal disease   . Renal disease   . Hypothyroidism   . Anxiety     PAST SURGICAL HISTORY:   Past Surgical History  Procedure Laterality Date  . Appendectomy    . Right oophorectomy    . Tubal ligation      R tube removed still w/ Left  . Dilation and curettage of uterus    . Gastric bypass  09/2009  . S/p gastric bypass      2011 [facility]  . Cardiac catheterization      [place] removed    . Esophagogastroduodenoscopy (egd) with propofol N/A 09/14/2014    Procedure: ESOPHAGOGASTRODUODENOSCOPY (EGD) WITH PROPOFOL;  Surgeon: Josefine Class, MD;  Location: [facility] ENDOSCOPY;  Service: Endoscopy;  Laterality: N/A;    SOCIAL HISTORY:   Social History  Substance Use Topics  . Smoking status: Former Smoker    Types: Cigarettes    Quit date: 08/31/1994  . Smokeless tobacco: Never Used     Comment: quit 28 years ago  . Alcohol Use: No    FAMILY HISTORY:   Family History  Problem Relation Age of Onset  . Hypertension Mother   . Anxiety disorder Mother   . Depression Mother   . Bipolar disorder Mother   . Kidney disease Father   . Heart disease Father   . Hypertension Father   . Diabetes Father   . Stroke Father   . Colon cancer Father     colon cancer  . Anxiety disorder  Father   . Kidney disease Sister   . Thyroid nodules Sister   . Hypertension Sister   . Hypertension Sister   . Diabetes Sister   . Hyperlipidemia Sister   . Depression Sister     DRUG ALLERGIES:   Allergies  Allergen Reactions  . Tramadol Other (See Comments)    Reaction:  Burning of pts mouth and confusion     REVIEW OF SYSTEMS:   Review of Systems  Constitutional: Negative for fever and weight loss.  HENT: Negative for congestion, nosebleeds and tinnitus.   Eyes: Negative for blurred vision, double vision and redness.  Respiratory: Positive for shortness of breath. Negative for cough and hemoptysis.   Cardiovascular: Positive for chest pain. Negative for orthopnea, leg swelling and PND.  Gastrointestinal: Positive for nausea. Negative for vomiting, abdominal pain, diarrhea and melena.  Genitourinary: Negative for dysuria, urgency and hematuria.  Musculoskeletal: Negative for joint pain and falls.  Neurological: Negative for dizziness, tingling, sensory change, focal weakness, seizures, weakness and headaches.  Endo/Heme/Allergies: Negative for polydipsia. Does not bruise/bleed easily.  Psychiatric/Behavioral: Negative for depression and memory loss. The patient is not nervous/anxious.     MEDICATIONS AT HOME:   Prior to Admission medications   Medication Sig Start Date End Date Taking? Authorizing Provider  aspirin EC 81 MG tablet Take 81 mg by mouth daily.   Yes Historical Provider, MD  baclofen (LIORESAL) 10 MG tablet Take 10 mg by mouth 3 (three) times daily as needed for muscle spasms.    Yes Historical Provider, MD  Brexpiprazole 2 MG TABS Take 2 mg by mouth daily. One tablet daily. 10/02/14  Yes Marjie Skiff, MD  canagliflozin (INVOKANA) 300 MG TABS tablet Take 300 mg by mouth daily.   Yes Historical Provider, MD  carvedilol (COREG) 12.5 MG tablet Take 12.5 mg by mouth 2 (two) times daily.   Yes Historical Provider, MD  citalopram (CELEXA) 40 MG tablet Take 1  tablet (40 mg total) by mouth daily. 10/02/14  Yes Marjie Skiff, MD  cloNIDine (CATAPRES) 0.2 MG tablet Take 0.2 mg by mouth at bedtime.   Yes Historical Provider, MD  clopidogrel (PLAVIX) 75 MG tablet Take 75 mg by mouth daily.   Yes Historical Provider, MD  enalapril (VASOTEC) 5 MG tablet Take 5 mg by mouth daily.   Yes Historical Provider, MD  gabapentin (NEURONTIN) 300 MG capsule Take 300-600 mg by mouth 2 (two) times daily. Pt takes two in the morning and one at bedtime.   Yes Historical Provider, MD  insulin aspart (NOVOLOG) 100 UNIT/ML injection Inject 4-15 Units into the skin 3 (three) times daily as needed for high blood sugar. Pt uses per sliding scale.  Yes Historical Provider, MD  insulin detemir (LEVEMIR) 100 UNIT/ML injection Inject 0.25 mLs (25 Units total) into the skin as needed. Patient taking differently: Inject 25 Units into the skin at bedtime as needed (for high blood sugar).  09/14/14  Yes Margarita Rana, MD  levothyroxine (SYNTHROID, LEVOTHROID) 25 MCG tablet Take 25 mcg by mouth daily before breakfast.   Yes Historical Provider, MD  metFORMIN (GLUCOPHAGE) 1000 MG tablet Take 1,000 mg by mouth 2 (two) times daily.   Yes Historical Provider, MD  pantoprazole (PROTONIX) 40 MG tablet Take 40 mg by mouth 2 (two) times daily.    Yes Historical Provider, MD  promethazine (PHENERGAN) 25 MG tablet Take 25 mg by mouth every 6 (six) hours as needed for nausea or vomiting.   Yes Historical Provider, MD  temazepam (RESTORIL) 15 MG capsule Take 15 mg by mouth at bedtime.   Yes Historical Provider, MD  Brexpiprazole (REXULTI) 1 MG TABS Take 1 mg by mouth every morning. Patient not taking: Reported on 11/07/2014 08/31/14   Marjie Skiff, MD      VITAL SIGNS:  Blood pressure 137/91, pulse 79, temperature 98.8 F (37.1 C), temperature source Oral, resp. rate 22, height 5\' 3"  (1.6 m), weight 81.194 kg (179 lb), SpO2 99 %.  PHYSICAL EXAMINATION:  Physical Exam  GENERAL:  44  y.o.-year-old patient lying in the bed with no acute distress.  EYES: Pupils equal, round, reactive to light and accommodation. No scleral icterus. Extraocular muscles intact.  HEENT: Head atraumatic, normocephalic. Oropharynx and nasopharynx clear. No oropharyngeal erythema, moist oral mucosa  NECK:  Supple, no jugular venous distention. No thyroid enlargement, no tenderness.  LUNGS: Normal breath sounds bilaterally, no wheezing, rales, rhonchi. No use of accessory muscles of respiration.  CARDIOVASCULAR: S1, S2 RRR. No murmurs, rubs, gallops, clicks.  ABDOMEN: Soft, nontender, nondistended. Bowel sounds present. No organomegaly or mass.  EXTREMITIES: No pedal edema, cyanosis, or clubbing. + 2 pedal & radial pulses b/l.   NEUROLOGIC: Cranial nerves II through XII are intact. No focal Motor or sensory deficits appreciated b/l PSYCHIATRIC: The patient is alert and oriented x 3. Good affect.  SKIN: No obvious rash, lesion, or ulcer.   LABORATORY PANEL:   CBC  Recent Labs Lab 11/07/14 1802  WBC 10.4  HGB 11.8*  HCT 36.1  PLT 293   ------------------------------------------------------------------------------------------------------------------  Chemistries   Recent Labs Lab 11/07/14 1802  NA 134*  K 3.9  CL 106  CO2 20*  GLUCOSE 234*  BUN 24*  CREATININE 2.00*  CALCIUM 8.4*   ------------------------------------------------------------------------------------------------------------------  Cardiac Enzymes  Recent Labs Lab 11/07/14 1802  TROPONINI 0.04*   ------------------------------------------------------------------------------------------------------------------  RADIOLOGY:  Dg Chest 2 View  11/07/2014   CLINICAL DATA:  Chest pain since yesterday  EXAM: CHEST  2 VIEW  COMPARISON:  06/03/2014  FINDINGS: Normal heart size. Clear lungs. Azygos lobe is noted. Low volumes. No pneumothorax. Postoperative changes in the left thoracic inlet.  IMPRESSION: No active  cardiopulmonary disease.   Electronically Signed   By: Marybelle Killings M.D.   On: 11/07/2014 19:00     IMPRESSION AND PLAN:   44 year old female with past medical history of coronary disease, multiple sclerosis, hypertension, hyperlipidemia, anxiety, chronic kidney disease stage III, 2 diabetes without complication, who presents to the hospital due to chest pain with mildly elevated troponin.  #1 chest pain-patient does have risk factors given her history of diabetes, history of coronary artery disease.   -We'll observe the patient overnight on telemetry, follow  serial cardiac markers. -I will get a nuclear medicine stress test in the a.m. Get a cardiology consult. -Continue aspirin, Plavix, beta blocker, statin, nitroglycerin.  #2 hypothyroidism-continue Synthroid.  #3 type 2 diabetes without complication-continue NovoLog sliding scale. -cont. Invokana.   #4 diabetic neuropathy-continue Neurontin.  #5 GERD-continue Protonix.  #6 depression-continue Celexa/Restoril   All the records are reviewed and case discussed with ED provider. Management plans discussed with the patient, family and they are in agreement.  CODE STATUS: Full  TOTAL TIME TAKING CARE OF THIS PATIENT: 45 minutes.    Henreitta Leber M.D on 11/07/2014 at 8:23 PM  Between 7am to 6pm - Pager - [phone]  After 6pm go to [email or web address] - password [email or web address] [facility]  Office  [phone]  CC: Primary care physician; [name], MD

## 2014-11-07 NOTE — Telephone Encounter (Signed)
Rx called in to pharmacy. 

## 2014-11-07 NOTE — Telephone Encounter (Signed)
Please call in temazepam 

## 2014-11-07 NOTE — ED Notes (Signed)
Pt reports chest pain since yesterday, has taken over 8 nitro since onset of the pain. Last took a nitro around 1pm today.

## 2014-11-07 NOTE — ED Notes (Signed)
Lab called with troponin of 0.04   md aware.

## 2014-11-07 NOTE — ED Provider Notes (Signed)
[facility] Emergency Department Provider Note  ____________________________________________  Time seen: Approximately 7:02 PM  I have reviewed the triage vital signs and the nursing notes.   HISTORY  Chief Complaint Chest Pain    HPI [name] is a 44 y.o. female patient reports she's been having chest pain since last night. The pain is heavy in her chest goes up into her neck into her left shoulder blade has gone all the way down the left arm 1.. Patient reports she's taken approximately 8 nitroglycerin since last night the Nitrol make the pain go away for about 2 hours and he comes back again. Patient takes one 81 mg aspirin and Plavix every day. Patient has had episodes of sweating and shortness of breath with this in the last couple days. It's worse with exercise 2.  Past Medical History  Diagnosis Date  . Colon polyp   . GERD (gastroesophageal reflux disease)   . History of left heart catheterization (LHC)     a. cath 08/09/2003: mild LAD plaque, no obs dzs noted; b. 12/18/10 stress test w/o ischemia.   Marland Kitchen CVA (cerebral vascular accident) ([place])     a. 06/2008-TEE nl LV fxn; b. 01/2013 - notes indicate short run of SVT at that time  . Multiple sclerosis ([place]) 2015  . Syncope   . Chronic systolic CHF (congestive heart failure) ([place])     a. echo 01/29/2013 EF 40-45%, basal anteroseptal & mid anteroseptal segments abnl; b echo 05/2012 EF 50-55%, mildly dilated LA, nl RVSP  . Orthostatic hypotension   . Aortic arch aneurysm ([place])   . Cardiomyopathy ([place])   . MS (mitral stenosis)   . Malignant melanoma of skin of scalp ([place])   . Hypertension   . Depression   . Diabetes mellitus without complication ([place])   . Headache     Migrains  . Hyperlipidemia   . Renal disease   . Renal disease   . Hypothyroidism   . Anxiety     Patient Active Problem List   Diagnosis Date Noted  . Malignant melanoma ([place]) 08/25/2014  . Chronic kidney disease (CKD),  stage III (moderate) 08/25/2014  . Chronic systolic CHF (congestive heart failure) ([place])   . Urinary incontinence in female 07/12/2014  . Incomplete bladder emptying 07/12/2014  . H/O surgical procedure 06/14/2014  . Depression, major, recurrent, in partial remission ([place]) 05/09/2014  . Bipolar 1 disorder, depressed ([place]) 05/09/2014  . Left subclavian artery, anomalous origin 03/06/2014  . Aberrant subclavian artery 11/17/2013  . Dysphagia lusoria 11/02/2013  . Cerebrovascular accident, old 11/02/2013  . DS (disseminated sclerosis) ([place]) 11/02/2013  . Diabetes mellitus, type 2 ([place]) 11/02/2013  . Mechanical and motor problems with internal organs 07/31/2013  . Dysphasia 07/17/2013  . Cerebral vascular accident ([place]) 06/20/2013  . Personal history of urinary disorder 05/30/2013  . Headache, migraine 05/29/2013  . Orthostatic hypotension 02/09/2013  . Diabetes ([place]) 12/12/2012  . Headache disorder 12/12/2012  . Hyperlipidemia   . Obesity   . GERD (gastroesophageal reflux disease)   . Numbness   . Slurred speech   . Weakness   . Gastric ulcer 04/27/2011  . Family hx of colon cancer 03/20/2011  . Palpitations 01/26/2011  . Neuropathy ([place]) 01/02/2011  . Chest pain 12/22/2010  . Insomnia 08/03/2010  . LUMBAR RADICULOPATHY 12/02/2009  . NEVUS, ATYPICAL 09/16/2009  . BARIATRIC SURGERY STATUS 09/16/2009  . DYSURIA 03/11/2009  . Hypersomnia 12/05/2008  . NOCTURIA 12/05/2008  . CONTACT DERMATITIS  09/19/2008  . WEAKNESS 07/26/2008  . NUMBNESS 07/23/2008  . CVA (cerebral vascular accident) ([place]) 06/21/2008  . Diabetes mellitus type 2 with complications, uncontrolled ([place]) 05/01/2008  . HYPERLIPIDEMIA 05/01/2008  . OBESITY 05/01/2008  . ANXIETY 05/01/2008  . Essential hypertension 05/01/2008    Past Surgical History  Procedure Laterality Date  . Appendectomy    . Right oophorectomy    . Tubal ligation      R tube removed still w/ Left  . Dilation and curettage of uterus     . Gastric bypass  09/2009  . S/p gastric bypass      2011 [facility]  . Cardiac catheterization      [place] removed    . Esophagogastroduodenoscopy (egd) with propofol N/A 09/14/2014    Procedure: ESOPHAGOGASTRODUODENOSCOPY (EGD) WITH PROPOFOL;  Surgeon: Josefine Class, MD;  Location: [facility] ENDOSCOPY;  Service: Endoscopy;  Laterality: N/A;    Current Outpatient Rx  Name  Route  Sig  Dispense  Refill  . aspirin 325 MG EC tablet   Oral   Take 325 mg by mouth daily.         . baclofen (LIORESAL) 10 MG tablet   Oral   Take 10 mg by mouth 3 (three) times daily.         . Brexpiprazole (REXULTI) 1 MG TABS   Oral   Take 1 mg by mouth every morning.   30 tablet   1   . Brexpiprazole 2 MG TABS   Oral   Take 2 mg by mouth daily. One tablet daily.   30 tablet   3     Hod, until patient calls for prescription.   . carvedilol (COREG) 12.5 MG tablet      TAKE ONE TABLET BY MOUTH TWICE DAILY   60 tablet   12   . chlorthalidone (HYGROTON) 25 MG tablet      TAKE ONE TABLET BY MOUTH ONCE DAILY   30 tablet   6   . citalopram (CELEXA) 40 MG tablet   Oral   Take 1 tablet (40 mg total) by mouth daily.   30 tablet   3     Hold until patient calls for a refill.   . cloNIDine (CATAPRES) 0.2 MG tablet   Oral   Take 0.2 mg by mouth daily.       5   . glucose blood (ONE TOUCH ULTRA TEST) test strip   Other   1 each by Other route. Use as instructed  Up to 6 times daily          . hydrochlorothiazide (HYDRODIURIL) 25 MG tablet   Oral   Take 25 mg by mouth daily.          . insulin aspart (NOVOLOG) 100 UNIT/ML injection   Subcutaneous   Inject 4-15 Units into the skin 3 (three) times daily as needed for high blood sugar. Takes anywhere from 4-15 units as directed per sliding scale         . insulin detemir (LEVEMIR) 100 UNIT/ML injection   Subcutaneous   Inject 0.25 mLs (25 Units total) into the skin as needed.   10 mL   1   . INVOKANA  300 MG TABS tablet      TAKE ONE TABLET BY MOUTH ONCE DAILY   30 tablet   5   . levothyroxine (SYNTHROID, LEVOTHROID) 25 MCG tablet      TAKE ONE TABLET BY MOUTH  ONCE DAILY   30 tablet   12   . metFORMIN (GLUCOPHAGE) 1000 MG tablet   Oral   Take 1,000 mg by mouth 2 (two) times daily with a meal.          . pantoprazole (PROTONIX) 40 MG tablet   Oral   Take 80 mg by mouth daily.         . promethazine (PHENERGAN) 25 MG tablet      TAKE ONE TABLET BY MOUTH EVERY 6 HOURS AS NEEDED FOR NAUSEA   30 tablet   3   . temazepam (RESTORIL) 15 MG capsule      TAKE ONE CAPSULE BY MOUTH AT BEDTIME AS NEEDED FOR SLEEP   30 capsule   2     Allergies Tramadol  Family History  Problem Relation Age of Onset  . Hypertension Mother   . Anxiety disorder Mother   . Depression Mother   . Bipolar disorder Mother   . Kidney disease Father   . Heart disease Father   . Hypertension Father   . Diabetes Father   . Stroke Father   . Colon cancer Father     colon cancer  . Anxiety disorder Father   . Kidney disease Sister   . Thyroid nodules Sister   . Hypertension Sister   . Hypertension Sister   . Diabetes Sister   . Hyperlipidemia Sister   . Depression Sister     Social History Social History  Substance Use Topics  . Smoking status: Former Smoker    Types: Cigarettes    Quit date: 08/31/1994  . Smokeless tobacco: Never Used     Comment: quit 28 years ago  . Alcohol Use: No    Review of Systems Constitutional: No fever/chills Eyes: No visual changes. ENT: No sore throat. Cardiovascular: See history of present illness Respiratory: See history of present illness Gastrointestinal: No abdominal pain.   no vomiting.  No diarrhea.  No constipation. Genitourinary: Negative for dysuria. Musculoskeletal: Negative for back pain. Skin: Negative for rash. Neurological: Negative for headaches, focal weakness or numbness.  10-point ROS otherwise  negative.  ____________________________________________   PHYSICAL EXAM:  VITAL SIGNS: ED Triage Vitals  Enc Vitals Group     BP 11/07/14 1803 115/73 mmHg     Pulse Rate 11/07/14 1803 99     Resp 11/07/14 1803 18     Temp 11/07/14 1803 98.8 F (37.1 C)     Temp Source 11/07/14 1803 Oral     SpO2 11/07/14 1803 100 %     Weight 11/07/14 1803 179 lb (81.194 kg)     Height 11/07/14 1803 5\' 3"  (1.6 m)     Head Cir --      Peak Flow --      Pain Score 11/07/14 1803 9     Pain Loc --      Pain Edu? --      Excl. in [place]? --     Constitutional: Alert and oriented. Well appearing and in no acute distress. Eyes: Conjunctivae are normal. PERRL. EOMI. Head: Atraumatic. Nose: No congestion/rhinnorhea. Mouth/Throat: Mucous membranes are moist.  Oropharynx non-erythematous. Neck: No stridor. Cardiovascular: Normal rate, regular rhythm. Grossly normal heart sounds.  Good peripheral circulation. Respiratory: Normal respiratory effort.  No retractions. Lungs CTAB. Gastrointestinal: Soft and nontender. No distention. No abdominal bruits. No CVA tenderness. Musculoskeletal: No lower extremity tenderness nor edema.  No joint effusions. Neurologic:  Normal speech and language. No gross focal neurologic deficits  are appreciated. No gait instability. Skin:  Skin is warm, dry and intact. No rash noted. Psychiatric: Mood and affect are normal. Speech and behavior are normal.  ____________________________________________   LABS (all labs ordered are listed, but only abnormal results are displayed)  Labs Reviewed  BASIC METABOLIC PANEL - Abnormal; Notable for the following:    Sodium 134 (*)    CO2 20 (*)    Glucose, Bld 234 (*)    BUN 24 (*)    Creatinine, Ser 2.00 (*)    Calcium 8.4 (*)    GFR calc non Af Amer 29 (*)    GFR calc Af Amer 33 (*)    All other components within normal limits  CBC - Abnormal; Notable for the following:    Hemoglobin 11.8 (*)    MCV 78.7 (*)    MCH 25.8 (*)     RDW 14.6 (*)    All other components within normal limits  TROPONIN I - Abnormal; Notable for the following:    Troponin I 0.04 (*)    All other components within normal limits   ____________________________________________  EKG  EKG read and interpreted by me shows normal sinus rhythm at a rate of 97 normal axis nonspecific ST-T wave changes there is a hint of nonsignificant ST elevation in V1 and V2 ____________________________________________  RADIOLOGY  Chest x-ray read by the radiologist as normal By me as well compared to previous chest x-ray the patient has a known aortic aneurysm in the chest the x-ray from today looks same as previous one although the mediastinum is slightly wide and both of them. ____________________________________________   PROCEDURES    ____________________________________________   INITIAL IMPRESSION / ASSESSMENT AND PLAN / ED COURSE  Pertinent labs & imaging results that were available during my care of the patient were reviewed by me and considered in my medical decision making (see chart for details).  Patient will get 3 more baby aspirin some Nitropaste along with IV fluid we will plan to admit her for chest pain ____________________________________________   FINAL CLINICAL IMPRESSION(S) / ED DIAGNOSES  Final diagnoses:  Ischemic chest pain [facility])      Nena Polio, MD 11/07/14 1907

## 2014-11-07 NOTE — ED Notes (Signed)
MD at bedside. 

## 2014-11-08 ENCOUNTER — Observation Stay (HOSPITAL_COMMUNITY): Payer: BC Managed Care – PPO

## 2014-11-08 ENCOUNTER — Encounter: Payer: Self-pay | Admitting: Physician Assistant

## 2014-11-08 DIAGNOSIS — R0789 Other chest pain: Secondary | ICD-10-CM

## 2014-11-08 DIAGNOSIS — R079 Chest pain, unspecified: Secondary | ICD-10-CM

## 2014-11-08 DIAGNOSIS — I209 Angina pectoris, unspecified: Secondary | ICD-10-CM | POA: Diagnosis not present

## 2014-11-08 LAB — NM MYOCAR MULTI W/SPECT W/WALL MOTION / EF
CHL CUP NUCLEAR SRS: 8
CHL CUP NUCLEAR SSS: 0
CHL CUP STRESS STAGE 2 GRADE: 0 %
CHL CUP STRESS STAGE 2 HR: 55 {beats}/min
CHL CUP STRESS STAGE 2 SBP: 116 mmHg
CHL CUP STRESS STAGE 2 SPEED: 0 mph
CHL CUP STRESS STAGE 3 GRADE: 0 %
CHL CUP STRESS STAGE 3 HR: 55 {beats}/min
CHL CUP STRESS STAGE 5 HR: 65 {beats}/min
CHL CUP STRESS STAGE 5 SPEED: 0 mph
CSEPEW: 1 METS
CSEPHR: 48 %
CSEPPHR: 84 {beats}/min
LV dias vol: 99 mL
LVSYSVOL: 47 mL
NUC STRESS TID: 1.25
Percent of predicted max HR: 48 %
Rest HR: 59 {beats}/min
SDS: 0
Stage 1 Grade: 0 %
Stage 1 HR: 59 {beats}/min
Stage 1 Speed: 0 mph
Stage 2 DBP: 62 mmHg
Stage 3 Speed: 0 mph
Stage 4 Grade: 0 %
Stage 4 HR: 84 {beats}/min
Stage 4 Speed: 0 mph
Stage 5 DBP: 66 mmHg
Stage 5 Grade: 0 %
Stage 5 SBP: 114 mmHg

## 2014-11-08 LAB — GLUCOSE, CAPILLARY
GLUCOSE-CAPILLARY: 127 mg/dL — AB (ref 65–99)
GLUCOSE-CAPILLARY: 168 mg/dL — AB (ref 65–99)
Glucose-Capillary: 96 mg/dL (ref 65–99)
Glucose-Capillary: 193 mg/dL — ABNORMAL HIGH (ref 65–99)

## 2014-11-08 LAB — TROPONIN I: TROPONIN I: 0.04 ng/mL — AB (ref <0.031)

## 2014-11-08 MED ORDER — SODIUM CHLORIDE 0.9 % IV BOLUS (SEPSIS)
500.0000 mL | Freq: Once | INTRAVENOUS | Status: AC
  Administered 2014-11-08: 500 mL via INTRAVENOUS

## 2014-11-08 MED ORDER — TECHNETIUM TC 99M SESTAMIBI - CARDIOLITE
13.4940 | Freq: Once | INTRAVENOUS | Status: AC | PRN
Start: 2014-11-08 — End: 2014-11-08
  Administered 2014-11-08: 13.494 via INTRAVENOUS

## 2014-11-08 MED ORDER — ATORVASTATIN CALCIUM 20 MG PO TABS
40.0000 mg | ORAL_TABLET | Freq: Every day | ORAL | Status: DC
  Filled 2014-11-08: qty 2

## 2014-11-08 MED ORDER — ISOSORBIDE MONONITRATE ER 30 MG PO TB24
30.0000 mg | ORAL_TABLET | Freq: Every day | ORAL | Status: DC
  Administered 2014-11-08 – 2014-11-13 (×6): 30 mg via ORAL
  Filled 2014-11-08 (×6): qty 1

## 2014-11-08 MED ORDER — REGADENOSON 0.4 MG/5ML IV SOLN
0.4000 mg | Freq: Once | INTRAVENOUS | Status: AC
  Administered 2014-11-08: 0.4 mg via INTRAVENOUS

## 2014-11-08 MED ORDER — CARVEDILOL 6.25 MG PO TABS
6.2500 mg | ORAL_TABLET | Freq: Two times a day (BID) | ORAL | Status: DC
  Administered 2014-11-08 – 2014-11-11 (×6): 6.25 mg via ORAL
  Filled 2014-11-08 (×6): qty 1

## 2014-11-08 MED ORDER — TECHNETIUM TC 99M SESTAMIBI - CARDIOLITE
32.3990 | Freq: Once | INTRAVENOUS | Status: AC | PRN
  Administered 2014-11-08: 32.399 via INTRAVENOUS

## 2014-11-08 MED ORDER — IBUPROFEN 200 MG PO TABS
400.0000 mg | ORAL_TABLET | Freq: Three times a day (TID) | ORAL | Status: DC
  Administered 2014-11-08 (×2): 400 mg via ORAL
  Filled 2014-11-08 (×3): qty 2

## 2014-11-08 MED ORDER — GI COCKTAIL ~~LOC~~
30.0000 mL | Freq: Once | ORAL | Status: AC
  Administered 2014-11-08: 30 mL via ORAL
  Filled 2014-11-08: qty 30

## 2014-11-08 NOTE — Progress Notes (Signed)
Alert and oriented. Sinus brady in the 50's to NSR in the 60's on tele. Complaints of chest pressure that at times radiates down her left arm and shoots pain up her shoulder, given PRN medications and scheduled ibuprofen. Slight improvement with the morphine, no improvement from ibuprofen after first dose. Patient is resting okay with no shortness of breath and is able to ambulate in the room independently. Patient verbalized to RN that she is very uncomfortable leaving the hospital without having a cardiac catheterization, and voiced she is frustrated with the cardiologist's decision. Stating "I have the same problems as my dad and doctors don't catch things in [place] until it is too late". Discussed with patient she could readdress it in the morning with the cardiology team when they round on her, patient is comfortable with this. No further questions at this time. Will continue to monitor.

## 2014-11-08 NOTE — Progress Notes (Signed)
Patient is complaining of chest pressure 5/10. Morphine is the only thing available. BP is currently 99/63. PA Christell Faith in room to assess patient and notified. Oral pain medication is being considered. Nitro paste removed from chest per PA order. Fluid bolus will be given before stress test. Will continue to monitor.

## 2014-11-08 NOTE — Progress Notes (Signed)
Discussed bed alarm importance with patient and educated on necessity of bed alarm.  Pt refuses bed alarm at this time.  Will continue to monitor and pt informed to call if she needs help.   Lynnda Shields, RN

## 2014-11-08 NOTE — Progress Notes (Signed)
Pt complaining of chest pain, pain not relieved after 2 mg Morphine.  MD, Dr. Lavetta Nielsen came to see pt.  MD ordered to finish bolus and monitor BP.  GI cocktail ordered in case r/t heart burn.  MD ordered EKG as well.  If BP improves pt can have more pain medication at scheduled time.  Will continue to monitor. Jessee Avers

## 2014-11-08 NOTE — Progress Notes (Signed)
[facility] - [place] at [facility]   PATIENT NAME: [name]    MR#:  [id number]  DATE OF BIRTH:  March 14, 1970  SUBJECTIVE:  CHIEF COMPLAINT:   Chief Complaint  Patient presents with  . Chest Pain  persistent pain on lt with some reproducibility. REVIEW OF SYSTEMS:  Review of Systems  Constitutional: Negative for fever, weight loss, malaise/fatigue and diaphoresis.  HENT: Negative for ear discharge, ear pain, hearing loss, nosebleeds, sore throat and tinnitus.   Eyes: Negative for blurred vision and pain.  Respiratory: Negative for cough, hemoptysis, shortness of breath and wheezing.   Cardiovascular: Positive for chest pain. Negative for palpitations, orthopnea and leg swelling.  Gastrointestinal: Negative for heartburn, nausea, vomiting, abdominal pain, diarrhea, constipation and blood in stool.  Genitourinary: Negative for dysuria, urgency and frequency.  Musculoskeletal: Negative for myalgias and back pain.  Skin: Negative for itching and rash.  Neurological: Negative for dizziness, tingling, tremors, focal weakness, seizures, weakness and headaches.  Psychiatric/Behavioral: Negative for depression. The patient is not nervous/anxious.    DRUG ALLERGIES:   Allergies  Allergen Reactions  . Tramadol Other (See Comments)    Reaction:  Burning of pts mouth and confusion    VITALS:  Blood pressure 124/74, pulse 56, temperature 98.7 F (37.1 C), temperature source Oral, resp. rate 19, height 5\' 3"  (1.6 m), weight 81.194 kg (179 lb), SpO2 100 %. PHYSICAL EXAMINATION:  Physical Exam  Constitutional: She is oriented to person, place, and time and well-developed, well-nourished, and in no distress.  HENT:  Head: Normocephalic and atraumatic.  Eyes: Conjunctivae and EOM are normal. Pupils are equal, round, and reactive to light.  Neck: Normal range of motion. Neck supple. No tracheal deviation present. No thyromegaly present.  Cardiovascular: Normal  rate, regular rhythm and normal heart sounds.   Pulmonary/Chest: Effort normal and breath sounds normal. No respiratory distress. She has no wheezes. She exhibits no tenderness.  Abdominal: Soft. Bowel sounds are normal. She exhibits no distension. There is no tenderness.  Musculoskeletal: Normal range of motion.  Lt chest tenderness  Neurological: She is alert and oriented to person, place, and time. No cranial nerve deficit.  Skin: Skin is warm and dry. No rash noted.  Psychiatric: Mood and affect normal.   LABORATORY PANEL:   CBC  Recent Labs Lab 11/07/14 1802  WBC 10.4  HGB 11.8*  HCT 36.1  PLT 293   ------------------------------------------------------------------------------------------------------------------ Chemistries   Recent Labs Lab 11/07/14 1802  NA 134*  K 3.9  CL 106  CO2 20*  GLUCOSE 234*  BUN 24*  CREATININE 2.00*  CALCIUM 8.4*   RADIOLOGY:  Dg Chest 2 View  11/07/2014   CLINICAL DATA:  Chest pain since yesterday  EXAM: CHEST  2 VIEW  COMPARISON:  06/03/2014  FINDINGS: Normal heart size. Clear lungs. Azygos lobe is noted. Low volumes. No pneumothorax. Postoperative changes in the left thoracic inlet.  IMPRESSION: No active cardiopulmonary disease.   Electronically Signed   By: Jolaine Click M.D.   On: 11/07/2014 19:00   ASSESSMENT AND PLAN:  44 year old female with past medical history of coronary disease, multiple sclerosis, hypertension, hyperlipidemia, anxiety, chronic kidney disease stage III, 2 diabetes without complication, who presents to the hospital due to chest pain with mildly elevated troponin.  #1 chest pain-patient does have risk factors given her history of diabetes, history of coronary artery disease.  -We'll observe the patient overnight on telemetry, follow serial cardiac markers. -nuclear medicine stress test results pending -  likely negative although patient still having pain. Sounds muscular - will try NSAIDs for 24 hrs. May need  VQ scan if pain persists. -Continue aspirin, Plavix, beta blocker, statin, nitroglycerin.  #2 hypothyroidism-continue Synthroid.  #3 type 2 diabetes without complication-continue NovoLog sliding scale. -cont. Invokana.   #4 diabetic neuropathy-continue Neurontin.  #5 GERD-continue Protonix.  #6 depression-continue Celexa/Restoril     All the records are reviewed and case discussed with Care Management/Social Worker. Management plans discussed with the patient, family and they are in agreement.  CODE STATUS: Full Code  TOTAL TIME TAKING CARE OF THIS PATIENT: 35 minutes.   More than 50% of the time was spent in counseling/coordination of care: YES  POSSIBLE D/C IN AM, DEPENDING ON CLINICAL CONDITION.   [facility], Skiler Olden M.D on 11/08/2014 at 5:06 PM  Between 7am to 6pm - Pager - [phone]  After 6pm go to [email or web address] - password [email or web address] [email or web address]  [place] [facility]  Office  [phone]  CC:  Primary care physician; Mila Merry, MD

## 2014-11-08 NOTE — Consult Note (Signed)
Cardiology Consultation Note  Patient ID: [name], MRN: [id number], DOB/AGE: Jun 02, 1970 44 y.o. Admit date: 11/07/2014   Date of Consult: 11/08/2014 Primary Physician: [name], MD Primary Cardiologist: Dr. [name], MD  Chief Complaint: Chest pain Reason for Consult: Left upper, outer chest wall pain  HPI: 44 y.o. female with h/o nonobstructive CAD by cardiac cath on August 09, 2003, numerous prior strokes, chronic systolic CHF, aortic arch aneurysm, aberrant left subclavian artery s/p bypass surgery, s/p gastric bypass surgery, CKD stage III, multiple sclerosis, orthostatic hypotension, HTN, HLD, syncope, hypothyroidism, migraine disorder, anxiety, and depression who presented to [facility] on 10/5 with complaints of nonexertional chest pain that began on the afternoon of 10/4 and have been stuttering in nature.   She last underwent ischemic evaluation in 2012 via nuclear stress testing that showed no ischemia in the setting of chest pain. Since then she has done well from a chest pain stand point. She underwent CT in 11/2013 showing right aortic arch with a large retro-esophageal vascular segment with persistent dorsal aortic segment giving off an aberrant left subclavian artery. The large caliber retroaortic segment measured 18 mm compared to the ascending aorta measured 22 mm. This large retroesophageal aortic segment along with the partially calcified atretic ductus ligament between the left aberrant subclavian artery and the left pulmonary artery created an almost complete vascular ring. This was felt to be the likelycause ofpatient's dysphagia. At her last follow up in 03/2014 she was doing well. She continued to have labile blood sugars, though glucose tabs were helping.   She emailed the office on 7:30 stating she had developed chest pain. Pain is worse with deep inspiration and is reproducible to palpation. Pain has been stuttering in nature since the evening of 10/4 when she was riding  in the car with her mother. She initially took a SL NTG from her husband with good results. Pain returned while she was cooking dinner and cleaning that night and lasted several minutes and self resolved. Pain then returned while sleeping x 2. She contacted the office on 10/5 asking what to do.    Upon her arrival to [facility] she was found to have troponin 0.04-->0.04-->0.03. ECG as below. CXR negative. SCr 2.00, K+ 3.9, glucose 234, WBC 10.4, hgb 11.8. Nitro paste was applied. Her BP has remained soft in the [id number] systolic. She has received a 500 mL NS bolus and GI cocktail. She continues to note intermittent chest pain. She is for [facility] this morning.     Past Medical History  Diagnosis Date  . Colon polyp   . GERD (gastroesophageal reflux disease)   . History of left heart catheterization (LHC)     a. cath 08/09/2003: mild LAD plaque, no obs dzs noted; b. 12/18/10 stress test w/o ischemia.   Marland Kitchen CVA (cerebral vascular accident) ([place])     a. 06/2008-TEE nl LV fxn; b. 01/2013 - notes indicate short run of SVT at that time  . Multiple sclerosis ([place]) 2015  . Syncope   . Chronic systolic CHF (congestive heart failure) ([place])     a. echo 01/29/2013 EF 40-45%, basal anteroseptal & mid anteroseptal segments abnl; b echo 05/2012 EF 50-55%, mildly dilated LA, nl RVSP  . Orthostatic hypotension   . Aortic arch aneurysm ([place])   . Cardiomyopathy ([place])   . MS (mitral stenosis)   . Malignant melanoma of skin of scalp ([place])   . Hypertension   . Depression   . Diabetes mellitus without complication (  [place])   . Headache     Migrains  . Hyperlipidemia   . Renal disease   . Renal disease   . Hypothyroidism   . Anxiety       Most Recent Cardiac Studies: Per PMH   Surgical History:  Past Surgical History  Procedure Laterality Date  . Appendectomy    . Right oophorectomy    . Tubal ligation      R tube removed still w/ Left  . Dilation and curettage of uterus    . Gastric bypass  09/2009  .  S/p gastric bypass      2011 [facility]  . Cardiac catheterization      [place] removed    . Esophagogastroduodenoscopy (egd) with propofol N/A 09/14/2014    Procedure: ESOPHAGOGASTRODUODENOSCOPY (EGD) WITH PROPOFOL;  Surgeon: Josefine Class, MD;  Location: [facility] ENDOSCOPY;  Service: Endoscopy;  Laterality: N/A;     Home Meds: Prior to Admission medications   Medication Sig Start Date End Date Taking? Authorizing Provider  aspirin EC 81 MG tablet Take 81 mg by mouth daily.   Yes Historical Provider, MD  baclofen (LIORESAL) 10 MG tablet Take 10 mg by mouth 3 (three) times daily as needed for muscle spasms.    Yes Historical Provider, MD  Brexpiprazole 2 MG TABS Take 2 mg by mouth daily. One tablet daily. 10/02/14  Yes Marjie Skiff, MD  canagliflozin (INVOKANA) 300 MG TABS tablet Take 300 mg by mouth daily.   Yes Historical Provider, MD  carvedilol (COREG) 12.5 MG tablet Take 12.5 mg by mouth 2 (two) times daily.   Yes Historical Provider, MD  citalopram (CELEXA) 40 MG tablet Take 1 tablet (40 mg total) by mouth daily. 10/02/14  Yes Marjie Skiff, MD  cloNIDine (CATAPRES) 0.2 MG tablet Take 0.2 mg by mouth at bedtime.   Yes Historical Provider, MD  clopidogrel (PLAVIX) 75 MG tablet Take 75 mg by mouth daily.   Yes Historical Provider, MD  enalapril (VASOTEC) 5 MG tablet Take 5 mg by mouth daily.   Yes Historical Provider, MD  gabapentin (NEURONTIN) 300 MG capsule Take 300-600 mg by mouth 2 (two) times daily. Pt takes two in the morning and one at bedtime.   Yes Historical Provider, MD  insulin aspart (NOVOLOG) 100 UNIT/ML injection Inject 4-15 Units into the skin 3 (three) times daily as needed for high blood sugar. Pt uses per sliding scale.   Yes Historical Provider, MD  insulin detemir (LEVEMIR) 100 UNIT/ML injection Inject 0.25 mLs (25 Units total) into the skin as needed. Patient taking differently: Inject 25 Units into the skin at bedtime as needed (for high blood  sugar).  09/14/14  Yes Margarita Rana, MD  levothyroxine (SYNTHROID, LEVOTHROID) 25 MCG tablet Take 25 mcg by mouth daily before breakfast.   Yes Historical Provider, MD  metFORMIN (GLUCOPHAGE) 1000 MG tablet Take 1,000 mg by mouth 2 (two) times daily.   Yes Historical Provider, MD  pantoprazole (PROTONIX) 40 MG tablet Take 40 mg by mouth 2 (two) times daily.    Yes Historical Provider, MD  promethazine (PHENERGAN) 25 MG tablet Take 25 mg by mouth every 6 (six) hours as needed for nausea or vomiting.   Yes Historical Provider, MD  temazepam (RESTORIL) 15 MG capsule Take 15 mg by mouth at bedtime.   Yes Historical Provider, MD  Brexpiprazole (REXULTI) 1 MG TABS Take 1 mg by mouth every morning. Patient not taking: Reported on 11/07/2014  08/31/14   Marjie Skiff, MD    Inpatient Medications:  . aspirin EC  81 mg Oral Daily  . Brexpiprazole  2 mg Oral Daily  . canagliflozin  300 mg Oral Daily  . carvedilol  12.5 mg Oral BID  . citalopram  40 mg Oral Daily  . cloNIDine  0.2 mg Oral QHS  . clopidogrel  75 mg Oral Daily  . enalapril  5 mg Oral Daily  . enoxaparin (LOVENOX) injection  40 mg Subcutaneous Q24H  . gabapentin  300 mg Oral QHS  . gabapentin  600 mg Oral q morning - 10a  . insulin aspart  0-5 Units Subcutaneous QHS  . insulin aspart  0-9 Units Subcutaneous TID WC  . levothyroxine  25 mcg Oral QAC breakfast  . pantoprazole  40 mg Oral BID  . sodium chloride  3 mL Intravenous Q12H  . temazepam  15 mg Oral QHS      Allergies:  Allergies  Allergen Reactions  . Tramadol Other (See Comments)    Reaction:  Burning of pts mouth and confusion     Social History   Social History  . Marital Status: Married    Spouse Name: N/A  . Number of Children: 1  . Years of Education: N/A   Occupational History  . CUSTODIAN   . Disabled    Social History Main Topics  . Smoking status: Former Smoker    Types: Cigarettes    Quit date: 08/31/1994  . Smokeless tobacco: Never Used      Comment: quit 28 years ago  . Alcohol Use: No  . Drug Use: No  . Sexual Activity: Not Currently    Birth Control/ Protection: None   Other Topics Concern  . Not on file   Social History Narrative   previously did custolial work. 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 Disabled through 08/02/2013 per forms 02/03/13     Family History  Problem Relation Age of Onset  . Hypertension Mother   . Anxiety disorder Mother   . Depression Mother   . Bipolar disorder Mother   . Kidney disease Father   . Heart disease Father   . Hypertension Father   . Diabetes Father   . Stroke Father   . Colon cancer Father     colon cancer  . Anxiety disorder Father   . Kidney disease Sister   . Thyroid nodules Sister   . Hypertension Sister   . Hypertension Sister   . Diabetes Sister   . Hyperlipidemia Sister   . Depression Sister      Review of Systems: Review of Systems  Constitutional: Positive for weight loss, malaise/fatigue and diaphoresis. Negative for fever and chills.  HENT: Negative for congestion.   Eyes: Negative for discharge and redness.  Respiratory: Positive for shortness of breath. Negative for cough, hemoptysis, sputum production and wheezing.   Cardiovascular: Positive for chest pain. Negative for palpitations, orthopnea, claudication, leg swelling and PND.       Left upper outer chest wall pain  Gastrointestinal: Positive for nausea. Negative for heartburn, vomiting and abdominal pain.  Musculoskeletal: Negative for falls.  Skin: Negative for rash.  Neurological: Positive for weakness. Negative for dizziness, tingling, tremors, sensory change, speech change, focal weakness and loss of consciousness.  Endo/Heme/Allergies: Does not bruise/bleed easily.  Psychiatric/Behavioral: The patient is nervous/anxious.   All other systems reviewed and are negative.    Labs:  Recent Labs  11/07/14  1802 11/08/14 0045 11/08/14 0658  TROPONINI 0.04* 0.04*  <0.03   Lab Results  Component Value Date   WBC 10.4 11/07/2014   HGB 11.8* 11/07/2014   HCT 36.1 11/07/2014   MCV 78.7* 11/07/2014   PLT 293 11/07/2014    Recent Labs Lab 11/07/14 1802  NA 134*  K 3.9  CL 106  CO2 20*  BUN 24*  CREATININE 2.00*  CALCIUM 8.4*  GLUCOSE 234*   Lab Results  Component Value Date   CHOL 197 12/30/2013   HDL 28* 12/30/2013   LDLCALC 117* 12/30/2013   TRIG 261* 12/30/2013   No results found for: DDIMER  Radiology/Studies:  Dg Chest 2 View  11/07/2014   CLINICAL DATA:  Chest pain since yesterday  EXAM: CHEST  2 VIEW  COMPARISON:  06/03/2014  FINDINGS: Normal heart size. Clear lungs. Azygos lobe is noted. Low volumes. No pneumothorax. Postoperative changes in the left thoracic inlet.  IMPRESSION: No active cardiopulmonary disease.   Electronically Signed   By: Marybelle Killings M.D.   On: 11/07/2014 19:00    EKG: 10/5 at 17:57 - NSR, 97 bpm, nonspecific st/t changes, TWI III. 10/6 at 4:10 AM - Sinus bradycardia, 55 bpm, prolonged QT at 485 msec, nonspecific st/t changes  Weights: Filed Weights   11/07/14 1803  Weight: 179 lb (81.194 kg)     Physical Exam: Blood pressure 99/63, pulse 55, temperature 97.7 F (36.5 C), temperature source Oral, resp. rate 18, height 5\' 3"  (1.6 m), weight 179 lb (81.194 kg), SpO2 100 %. Body mass index is 31.72 kg/(m^2). General: Well developed, well nourished, in no acute distress. Head: Normocephalic, atraumatic, sclera non-icteric, no xanthomas, nares are without discharge.  Neck: Negative for carotid bruits. JVD not elevated. Lungs: Clear bilaterally to auscultation without wheezes, rales, or rhonchi. Breathing is unlabored. Heart: RRR with S1 S2. No murmurs, rubs, or gallops appreciated. Chest pain worse with deep inspiration and reproducible to palpation along the left upper-outer chest wall.  Abdomen: Obese, soft, non-tender, non-distended with normoactive bowel sounds. No hepatomegaly. No rebound/guarding.  No obvious abdominal masses. Msk:  Strength and tone appear normal for age. Extremities: No clubbing or cyanosis. No edema.  Distal pedal pulses are 2+ and equal bilaterally. Neuro: Alert and oriented X 3. No facial asymmetry. No focal deficit. Moves all extremities spontaneously. Psych:  Responds to questions appropriately with a normal affect.    Assessment and Plan:   1. Elevated troponin with atypical chest pain: -Mildly elevated at 0.04-->0.04-->0.03, likely supply demand ischemia in the setting of her hypotension and CKD stage IV -Chest pain is atypical in nature as it is worse with deep inspiration, preventing her from taking a deep breath with auscultation on lung exam and and quite reproducible to palpation along the left upper-outer chest wall -She is for [facility] today to evaluate for high risk ischemia, previously with nonobstructive CAD by cardiac cath in 2005, and no ischemia by nuclear stress test in 2012 -Would be hesitant to rush to cardiac cath given her poor renal function with a baseline SCr around 2.0 -If Lexiscan is normal look to alternative etiologies such as pulmonary, MSK, or anxiety  2. Hypotension: -In the setting of persisting nitro paste being applied, this was removed this morning at the time of her consult by RN -Would give NS bolus, though patient is down for stress currently -Will monitor BP throughout the day, could cut back on her Coreg to 6.25 mg bid given her bradycardia as well -  Continue to monitor  3. History of previous strokes: -Continue aspirin and Plavix  4. CKD stage IV: -SCr stable -Avoid nephrotoxic agents -Should cardiac cath be needed would likely need to be a staged procedure, though would attempt to avoid this if possible -May see slight bump in renal function given her hypotension   5. History of left subclavian artery bypass: -Pain is just inferior to this and easily reproducible to palpation -Unlikely to be a complication,  though if symptoms persist could have vascular see  6. DM2: -Per IM  7. HLD: -Statin  Signed, Christell Faith, PA-C Pager: [phone] 11/08/2014, 9:45 AM

## 2014-11-08 NOTE — Progress Notes (Signed)
Pt complaining of CP- vital signs obtained.  BP low.  MD, Dr. Lavetta Nielsen notified-- 500 cc bolus ordered, okay'd to give prn dose of morphine with bolus.  Will continue to monitor. Jessee Avers

## 2014-11-08 NOTE — Progress Notes (Signed)
Patient taken for stress test before orders were placed to give bolus.

## 2014-11-09 ENCOUNTER — Encounter: Payer: Self-pay | Admitting: Cardiovascular Disease

## 2014-11-09 ENCOUNTER — Encounter
Admission: EM | Disposition: A | Discharge status: Home / Self Care | Payer: Self-pay | Source: Home / Self Care | Attending: Internal Medicine

## 2014-11-09 DIAGNOSIS — Z9884 Bariatric surgery status: Secondary | ICD-10-CM | POA: Diagnosis not present

## 2014-11-09 DIAGNOSIS — M5416 Radiculopathy, lumbar region: Secondary | ICD-10-CM | POA: Diagnosis present

## 2014-11-09 DIAGNOSIS — Z6832 Body mass index (BMI) 32.0-32.9, adult: Secondary | ICD-10-CM | POA: Diagnosis not present

## 2014-11-09 DIAGNOSIS — Z794 Long term (current) use of insulin: Secondary | ICD-10-CM | POA: Diagnosis not present

## 2014-11-09 DIAGNOSIS — F329 Major depressive disorder, single episode, unspecified: Secondary | ICD-10-CM | POA: Diagnosis present

## 2014-11-09 DIAGNOSIS — E11319 Type 2 diabetes mellitus with unspecified diabetic retinopathy without macular edema: Secondary | ICD-10-CM | POA: Diagnosis present

## 2014-11-09 DIAGNOSIS — E039 Hypothyroidism, unspecified: Secondary | ICD-10-CM | POA: Diagnosis present

## 2014-11-09 DIAGNOSIS — I69354 Hemiplegia and hemiparesis following cerebral infarction affecting left non-dominant side: Secondary | ICD-10-CM | POA: Diagnosis not present

## 2014-11-09 DIAGNOSIS — E1122 Type 2 diabetes mellitus with diabetic chronic kidney disease: Secondary | ICD-10-CM | POA: Diagnosis present

## 2014-11-09 DIAGNOSIS — E1121 Type 2 diabetes mellitus with diabetic nephropathy: Secondary | ICD-10-CM | POA: Diagnosis present

## 2014-11-09 DIAGNOSIS — I050 Rheumatic mitral stenosis: Secondary | ICD-10-CM | POA: Diagnosis present

## 2014-11-09 DIAGNOSIS — I25111 Atherosclerotic heart disease of native coronary artery with angina pectoris with documented spasm: Secondary | ICD-10-CM | POA: Diagnosis present

## 2014-11-09 DIAGNOSIS — I10 Essential (primary) hypertension: Secondary | ICD-10-CM | POA: Diagnosis not present

## 2014-11-09 DIAGNOSIS — D509 Iron deficiency anemia, unspecified: Secondary | ICD-10-CM | POA: Diagnosis present

## 2014-11-09 DIAGNOSIS — E785 Hyperlipidemia, unspecified: Secondary | ICD-10-CM | POA: Diagnosis present

## 2014-11-09 DIAGNOSIS — R197 Diarrhea, unspecified: Secondary | ICD-10-CM | POA: Diagnosis not present

## 2014-11-09 DIAGNOSIS — F419 Anxiety disorder, unspecified: Secondary | ICD-10-CM | POA: Diagnosis present

## 2014-11-09 DIAGNOSIS — E669 Obesity, unspecified: Secondary | ICD-10-CM | POA: Diagnosis present

## 2014-11-09 DIAGNOSIS — Z888 Allergy status to other drugs, medicaments and biological substances status: Secondary | ICD-10-CM | POA: Diagnosis not present

## 2014-11-09 DIAGNOSIS — E1140 Type 2 diabetes mellitus with diabetic neuropathy, unspecified: Secondary | ICD-10-CM | POA: Diagnosis present

## 2014-11-09 DIAGNOSIS — I5022 Chronic systolic (congestive) heart failure: Secondary | ICD-10-CM | POA: Diagnosis present

## 2014-11-09 DIAGNOSIS — I248 Other forms of acute ischemic heart disease: Secondary | ICD-10-CM | POA: Diagnosis present

## 2014-11-09 DIAGNOSIS — R079 Chest pain, unspecified: Secondary | ICD-10-CM | POA: Diagnosis present

## 2014-11-09 DIAGNOSIS — I209 Angina pectoris, unspecified: Secondary | ICD-10-CM | POA: Diagnosis not present

## 2014-11-09 DIAGNOSIS — E118 Type 2 diabetes mellitus with unspecified complications: Secondary | ICD-10-CM | POA: Diagnosis not present

## 2014-11-09 DIAGNOSIS — Z85820 Personal history of malignant melanoma of skin: Secondary | ICD-10-CM | POA: Diagnosis not present

## 2014-11-09 DIAGNOSIS — Z87891 Personal history of nicotine dependence: Secondary | ICD-10-CM | POA: Diagnosis not present

## 2014-11-09 DIAGNOSIS — Z7902 Long term (current) use of antithrombotics/antiplatelets: Secondary | ICD-10-CM | POA: Diagnosis not present

## 2014-11-09 DIAGNOSIS — Z8711 Personal history of peptic ulcer disease: Secondary | ICD-10-CM | POA: Diagnosis not present

## 2014-11-09 DIAGNOSIS — K219 Gastro-esophageal reflux disease without esophagitis: Secondary | ICD-10-CM | POA: Diagnosis present

## 2014-11-09 DIAGNOSIS — Z7982 Long term (current) use of aspirin: Secondary | ICD-10-CM | POA: Diagnosis not present

## 2014-11-09 DIAGNOSIS — Z86010 Personal history of colonic polyps: Secondary | ICD-10-CM | POA: Diagnosis not present

## 2014-11-09 DIAGNOSIS — G43909 Migraine, unspecified, not intractable, without status migrainosus: Secondary | ICD-10-CM | POA: Diagnosis present

## 2014-11-09 DIAGNOSIS — M4802 Spinal stenosis, cervical region: Secondary | ICD-10-CM | POA: Diagnosis present

## 2014-11-09 DIAGNOSIS — E1169 Type 2 diabetes mellitus with other specified complication: Secondary | ICD-10-CM | POA: Diagnosis present

## 2014-11-09 DIAGNOSIS — I129 Hypertensive chronic kidney disease with stage 1 through stage 4 chronic kidney disease, or unspecified chronic kidney disease: Secondary | ICD-10-CM | POA: Diagnosis present

## 2014-11-09 DIAGNOSIS — I429 Cardiomyopathy, unspecified: Secondary | ICD-10-CM | POA: Diagnosis present

## 2014-11-09 DIAGNOSIS — Z955 Presence of coronary angioplasty implant and graft: Secondary | ICD-10-CM | POA: Diagnosis not present

## 2014-11-09 DIAGNOSIS — N184 Chronic kidney disease, stage 4 (severe): Secondary | ICD-10-CM | POA: Diagnosis present

## 2014-11-09 DIAGNOSIS — Z7984 Long term (current) use of oral hypoglycemic drugs: Secondary | ICD-10-CM | POA: Diagnosis not present

## 2014-11-09 DIAGNOSIS — D631 Anemia in chronic kidney disease: Secondary | ICD-10-CM | POA: Diagnosis present

## 2014-11-09 DIAGNOSIS — Z79899 Other long term (current) drug therapy: Secondary | ICD-10-CM | POA: Diagnosis not present

## 2014-11-09 DIAGNOSIS — G35 Multiple sclerosis: Secondary | ICD-10-CM | POA: Diagnosis present

## 2014-11-09 DIAGNOSIS — Z8673 Personal history of transient ischemic attack (TIA), and cerebral infarction without residual deficits: Secondary | ICD-10-CM | POA: Diagnosis not present

## 2014-11-09 HISTORY — PX: CARDIAC CATHETERIZATION: SHX172

## 2014-11-09 LAB — BASIC METABOLIC PANEL
ANION GAP: 6 (ref 5–15)
BUN: 27 mg/dL — ABNORMAL HIGH (ref 6–20)
CALCIUM: 8.8 mg/dL — AB (ref 8.9–10.3)
CO2: 23 mmol/L (ref 22–32)
Chloride: 111 mmol/L (ref 101–111)
Creatinine, Ser: 1.76 mg/dL — ABNORMAL HIGH (ref 0.44–1.00)
GFR, EST AFRICAN AMERICAN: 39 mL/min — AB (ref >60)
GFR, EST NON AFRICAN AMERICAN: 34 mL/min — AB (ref >60)
GLUCOSE: 110 mg/dL — AB (ref 65–99)
POTASSIUM: 4.4 mmol/L (ref 3.5–5.1)
Sodium: 140 mmol/L (ref 135–145)

## 2014-11-09 LAB — LIPID PANEL
CHOLESTEROL: 164 mg/dL (ref 0–200)
HDL: 27 mg/dL — ABNORMAL LOW (ref >40)
LDL CALC: 85 mg/dL (ref 0–99)
TRIGLYCERIDES: 259 mg/dL — AB (ref <150)
Total CHOL/HDL Ratio: 6.1 RATIO
VLDL: 52 mg/dL — AB (ref 0–40)

## 2014-11-09 LAB — MRSA PCR SCREENING: MRSA by PCR: NEGATIVE

## 2014-11-09 LAB — GLUCOSE, CAPILLARY
GLUCOSE-CAPILLARY: 249 mg/dL — AB (ref 65–99)
Glucose-Capillary: 98 mg/dL (ref 65–99)
Glucose-Capillary: 120 mg/dL — ABNORMAL HIGH (ref 65–99)

## 2014-11-09 SURGERY — LEFT HEART CATH AND CORONARY ANGIOGRAPHY
Anesthesia: Moderate Sedation

## 2014-11-09 MED ORDER — MIDAZOLAM HCL 2 MG/2ML IJ SOLN
INTRAMUSCULAR | Status: DC | PRN
  Administered 2014-11-09: 1 mg via INTRAVENOUS

## 2014-11-09 MED ORDER — HEPARIN BOLUS VIA INFUSION
4000.0000 [IU] | Freq: Once | INTRAVENOUS | Status: AC
  Administered 2014-11-09: 4000 [IU] via INTRAVENOUS
  Filled 2014-11-09: qty 4000

## 2014-11-09 MED ORDER — FERROUS SULFATE 325 (65 FE) MG PO TABS
325.0000 mg | ORAL_TABLET | Freq: Every day | ORAL | Status: DC
  Administered 2014-11-10: 325 mg via ORAL
  Filled 2014-11-09: qty 1

## 2014-11-09 MED ORDER — HEPARIN (PORCINE) IN NACL 2-0.9 UNIT/ML-% IJ SOLN
INTRAMUSCULAR | Status: AC
  Filled 2014-11-09: qty 500

## 2014-11-09 MED ORDER — FENTANYL CITRATE (PF) 100 MCG/2ML IJ SOLN
INTRAMUSCULAR | Status: DC | PRN
  Administered 2014-11-09: 50 ug via INTRAVENOUS

## 2014-11-09 MED ORDER — METOPROLOL TARTRATE 1 MG/ML IV SOLN
5.0000 mg | Freq: Once | INTRAVENOUS | Status: AC
  Administered 2014-11-09: 5 mg via INTRAVENOUS
  Filled 2014-11-09: qty 5

## 2014-11-09 MED ORDER — IOHEXOL 300 MG/ML  SOLN
INTRAMUSCULAR | Status: DC | PRN
  Administered 2014-11-09: 80 mL via INTRA_ARTERIAL

## 2014-11-09 MED ORDER — SODIUM CHLORIDE 0.9 % IV SOLN
INTRAVENOUS | Status: DC
  Administered 2014-11-09 – 2014-11-10 (×3): via INTRAVENOUS

## 2014-11-09 MED ORDER — NITROGLYCERIN IN D5W 200-5 MCG/ML-% IV SOLN
10.0000 ug/min | INTRAVENOUS | Status: DC
  Administered 2014-11-09: 10 ug/min via INTRAVENOUS
  Filled 2014-11-09 (×2): qty 250

## 2014-11-09 MED ORDER — NITROGLYCERIN 5 MG/ML IV SOLN
INTRAVENOUS | Status: AC
  Filled 2014-11-09: qty 10

## 2014-11-09 MED ORDER — MIDAZOLAM HCL 2 MG/2ML IJ SOLN
INTRAMUSCULAR | Status: AC
  Filled 2014-11-09: qty 2

## 2014-11-09 MED ORDER — SODIUM CHLORIDE 0.9 % IV SOLN: INTRAVENOUS | Status: DC

## 2014-11-09 MED ORDER — NITROGLYCERIN 2 % TD OINT: 1.0000 [in_us] | TOPICAL_OINTMENT | Freq: Once | TRANSDERMAL | Status: DC

## 2014-11-09 MED ORDER — FENTANYL CITRATE (PF) 100 MCG/2ML IJ SOLN
INTRAMUSCULAR | Status: AC
  Filled 2014-11-09: qty 2

## 2014-11-09 MED ORDER — NITROGLYCERIN 1 MG/10 ML FOR IR/CATH LAB
INTRA_ARTERIAL | Status: DC | PRN
  Administered 2014-11-09: 200 ug

## 2014-11-09 MED ORDER — ASPIRIN 81 MG PO CHEW: 81.0000 mg | CHEWABLE_TABLET | ORAL | Status: AC

## 2014-11-09 MED ORDER — HEPARIN (PORCINE) IN NACL 100-0.45 UNIT/ML-% IJ SOLN
1750.0000 [IU]/h | INTRAMUSCULAR | Status: DC
  Administered 2014-11-09: 850 [IU]/h via INTRAVENOUS
  Administered 2014-11-10: 1350 [IU]/h via INTRAVENOUS
  Administered 2014-11-11: 1450 [IU]/h via INTRAVENOUS
  Filled 2014-11-09 (×10): qty 250

## 2014-11-09 SURGICAL SUPPLY — 11 items
CATH INFINITI 5FR ANG PIGTAIL (CATHETERS) ×1 IMPLANT
CATH INFINITI 5FR JL4 (CATHETERS) ×3 IMPLANT
CATH INFINITI JR4 5F (CATHETERS) ×3 IMPLANT
DEVICE CLOSURE MYNXGRIP 5F (Vascular Products) ×2 IMPLANT
KIT MANI 3VAL PERCEP (MISCELLANEOUS) ×3 IMPLANT
NDL PERC 18GX7CM (NEEDLE) ×1 IMPLANT
NEEDLE PERC 18GX7CM (NEEDLE) ×3 IMPLANT
NEEDLE SMART 18G ACCESS (NEEDLE) IMPLANT
PACK CARDIAC CATH (CUSTOM PROCEDURE TRAY) ×3 IMPLANT
SHEATH AVANTI 5FR X 11CM (SHEATH) ×3 IMPLANT
WIRE EMERALD 3MM-J .035X150CM (WIRE) ×3 IMPLANT

## 2014-11-09 NOTE — Progress Notes (Signed)
Patient is reporting that her chest pain has significantly increased to 9/10. Patient is sweaty and had intermittent periods of shortness of breath. Shortness of breath has mostly resolved now. Still sinus rhythm on tele. Vitals are stable. Patient placed on 2L of oxygen. Hospitalist on call paged, ordered to give nitro paste and call cardiologist on call. Will continue to closely monitor.

## 2014-11-09 NOTE — Progress Notes (Signed)
Report to leahj by cathy jarrett.  Check right groin for bleeding or hematoma.  Patient will be on bedrest for 2 hours post sheath pull---out of bed at 14:00.  Bilateral pulses are 2's DP's.Marland Kitchen

## 2014-11-09 NOTE — Progress Notes (Signed)
Cardiac catheterization report Severe mid LAD lesion estimated at 85-90%, long, tubular stenosis Otherwise only mild luminal irregularities  Case discussed with interventional cardiology. Given her renal failure, we will stage the procedure. Creatinine yesterday 2.0, today 1.79. She has had contrast from diagnostic procedure today. We will need to monitor her BMP closely over the weekend, continue fluid hydration today Restart fluids Sunday night in preparation for cardiac catheterization on Monday morning for PCI of the mid LAD Dr. Josefa Half to perform. Orders for catheterization will be placed on Sunday

## 2014-11-09 NOTE — Progress Notes (Addendum)
[facility] - [place] at [facility]   PATIENT NAME: [name]    MR#:  [id number]  DATE OF BIRTH:  Oct 18, 1970  SUBJECTIVE:  CHIEF COMPLAINT:   Chief Complaint  Patient presents with  . Chest Pain  Persistent pain on lt - cath showing severe LAD REVIEW OF SYSTEMS:  Review of Systems  Constitutional: Negative for fever, weight loss, malaise/fatigue and diaphoresis.  HENT: Negative for ear discharge, ear pain, hearing loss, nosebleeds, sore throat and tinnitus.   Eyes: Negative for blurred vision and pain.  Respiratory: Negative for cough, hemoptysis, shortness of breath and wheezing.   Cardiovascular: Positive for chest pain. Negative for palpitations, orthopnea and leg swelling.  Gastrointestinal: Negative for heartburn, nausea, vomiting, abdominal pain, diarrhea, constipation and blood in stool.  Genitourinary: Negative for dysuria, urgency and frequency.  Musculoskeletal: Negative for myalgias and back pain.  Skin: Negative for itching and rash.  Neurological: Negative for dizziness, tingling, tremors, focal weakness, seizures, weakness and headaches.  Psychiatric/Behavioral: Negative for depression. The patient is not nervous/anxious.    DRUG ALLERGIES:   Allergies  Allergen Reactions  . Tramadol Other (See Comments)    Reaction:  Burning of pts mouth and confusion    VITALS:  Blood pressure 128/72, pulse 64, temperature 98.4 F (36.9 C), temperature source Oral, resp. rate 16, height 5\' 3"  (1.6 m), weight 79.878 kg (176 lb 1.6 oz), SpO2 100 %. PHYSICAL EXAMINATION:  Physical Exam  Constitutional: She is oriented to person, place, and time and well-developed, well-nourished, and in no distress.  HENT:  Head: Normocephalic and atraumatic.  Eyes: Conjunctivae and EOM are normal. Pupils are equal, round, and reactive to light.  Neck: Normal range of motion. Neck supple. No tracheal deviation present. No thyromegaly present.  Cardiovascular:  Normal rate, regular rhythm and normal heart sounds.   Pulmonary/Chest: Effort normal and breath sounds normal. No respiratory distress. She has no wheezes. She exhibits no tenderness.  Abdominal: Soft. Bowel sounds are normal. She exhibits no distension. There is no tenderness.  Musculoskeletal: Normal range of motion.  Lt chest tenderness  Neurological: She is alert and oriented to person, place, and time. No cranial nerve deficit.  Skin: Skin is warm and dry. No rash noted.  Psychiatric: Mood and affect normal.   LABORATORY PANEL:   CBC  Recent Labs Lab 11/07/14 1802  WBC 10.4  HGB 11.8*  HCT 36.1  PLT 293   ------------------------------------------------------------------------------------------------------------------ Chemistries   Recent Labs Lab 11/09/14 0454  NA 140  K 4.4  CL 111  CO2 23  GLUCOSE 110*  BUN 27*  CREATININE 1.76*  CALCIUM 8.8*   RADIOLOGY:  No results found. ASSESSMENT AND PLAN:  44 year old female with past medical history of coronary disease, multiple sclerosis, hypertension, hyperlipidemia, anxiety, chronic kidney disease stage III, 2 diabetes without complication, who presents to the hospital due to chest pain with mildly elevated troponin.  #1 chest pain-patient does have risk factors given her history of diabetes, history of coronary artery disease.  Stress test neg. Due to her persistent symptoms - Cardiac catheterization performed this am. Showed - Severe mid LAD lesion estimated at 85-90%, long, tubular stenosis  Considering her renal function they are planning -  Monday morning for PCI of the mid LAD Will start gentle hydration and c/s nephro. Consider renal protection for cath on Monday.  #2 hypothyroidism-continue Synthroid.  #3 type 2 diabetes without complication-continue NovoLog sliding scale. -stop Invokana due to renal function.  #4 diabetic  neuropathy-continue Neurontin.  #5 GERD-continue Protonix.  #6  depression-continue Celexa/Restoril  #7 CKD 4: creat 1.7 (2.0)   All the records are reviewed and case discussed with Care Management/Social Worker. Management plans discussed with the patient, Dr Kirke Corin and they are in agreement.  CODE STATUS: Full Code  TOTAL TIME TAKING CARE OF THIS PATIENT: 35 minutes.   More than 50% of the time was spent in counseling/coordination of care: YES  POSSIBLE D/C IN AM, DEPENDING ON CLINICAL CONDITION.   [facility], Markes Shatswell M.D on 11/09/2014 at 2:39 PM  Between 7am to 6pm - Pager - [phone]  After 6pm go to [email or web address] - password [email or web address] [facility]  [facility]  Office  [phone]  CC: Primary care physician; Mila Merry, MD

## 2014-11-09 NOTE — Progress Notes (Signed)
CHF vest measurement was 34

## 2014-11-09 NOTE — Progress Notes (Signed)
[facility]  ROUNDING NOTE   Subjective:   [name]. [name] was admitted on 10/4 for chest pain. Had cardiac catheterization earlier today finding LAD obstruction. Scheduled for stent placement on Monday.   Was last seen in our office on 10/4 where she had chest pain but did not tell anyone. She has not been complaint with her medications.   Objective:  Vital signs in last 24 hours:  Temp:  [97.9 F (36.6 C)-98.9 F (37.2 C)] 98.4 F (36.9 C) (10/07 1403) Pulse Rate:  [59-86] 64 (10/07 1403) Resp:  [13-20] 16 (10/07 1403) BP: (108-157)/(56-75) 128/72 mmHg (10/07 1403) SpO2:  [96 %-100 %] 100 % (10/07 1403) Weight:  [79.878 kg (176 lb 1.6 oz)] 79.878 kg (176 lb 1.6 oz) (10/07 0906)  Weight change:  Filed Weights   11/07/14 1803 11/09/14 0906  Weight: 81.194 kg (179 lb) 79.878 kg (176 lb 1.6 oz)    Intake/Output: I/O last 3 completed shifts: In: 480 [P.O.:480] Out: 0    Intake/Output this shift:  Total I/O In: 3 [I.V.:3] Out: 200 [Urine:200]  Physical Exam: General: NAD  Head: Normocephalic, atraumatic. Moist oral mucosal membranes  Eyes: Anicteric, PERRL  Neck: Supple, trachea midline  Lungs:  Clear to auscultation  Heart: Regular rate and rhythm  Abdomen:  Soft, nontender,   Extremities: no peripheral edema.  Neurologic: Nonfocal, moving all four extremities  Skin: No lesions       Basic Metabolic Panel:  Recent Labs Lab 11/07/14 1802 11/09/14 0454  NA 134* 140  K 3.9 4.4  CL 106 111  CO2 20* 23  GLUCOSE 234* 110*  BUN 24* 27*  CREATININE 2.00* 1.76*  CALCIUM 8.4* 8.8*    Liver Function Tests: No results for input(s): AST, ALT, ALKPHOS, BILITOT, PROT, ALBUMIN in the last 168 hours. No results for input(s): LIPASE, AMYLASE in the last 168 hours. No results for input(s): AMMONIA in the last 168 hours.  CBC:  Recent Labs Lab 11/07/14 1802  WBC 10.4  HGB 11.8*  HCT 36.1  MCV 78.7*  PLT 293    Cardiac Enzymes:  Recent  Labs Lab 11/07/14 1802 11/08/14 0045 11/08/14 0658  TROPONINI 0.04* 0.04* <0.03    BNP: Invalid input(s): POCBNP  CBG:  Recent Labs Lab 11/08/14 1248 11/08/14 1640 11/08/14 1951 11/09/14 0749 11/09/14 1629  GLUCAP 96 193* 168* 98 249*    Microbiology: Results for orders placed or performed in visit on 08/20/14  Urine culture     Status: None   Collection Time: 08/21/14 12:00 AM  Result Value Ref Range Status   Urine Culture, Routine Final report  Final   Urine Culture result 1 Comment  Final    Comment: Mixed urogenital flora Less than 10,000 colonies/mL     Coagulation Studies: No results for input(s): LABPROT, INR in the last 72 hours.  Urinalysis: No results for input(s): COLORURINE, LABSPEC, PHURINE, GLUCOSEU, HGBUR, BILIRUBINUR, KETONESUR, PROTEINUR, UROBILINOGEN, NITRITE, LEUKOCYTESUR in the last 72 hours.  Invalid input(s): APPERANCEUR    Imaging: Dg Chest 2 View  11/07/2014   CLINICAL DATA:  Chest pain since yesterday  EXAM: CHEST  2 VIEW  COMPARISON:  06/03/2014  FINDINGS: Normal heart size. Clear lungs. Azygos lobe is noted. Low volumes. No pneumothorax. Postoperative changes in the left thoracic inlet.  IMPRESSION: No active cardiopulmonary disease.   Electronically Signed   By: Marybelle Killings M.D.   On: 11/07/2014 19:00   Nm Myocar Multi W/spect W/wall Motion / Ef  11/08/2014  There was no ST segment deviation noted during stress.  This is a low risk study.  The left ventricular ejection fraction is mildly decreased (45-54%).   The study is overall suboptimal due to GI uptake. No convincing evidence  of ischemia or infarcts.     Medications:   . sodium chloride 100 mL/hr at 11/09/14 0938   . [START ON 11/10/2014] aspirin  81 mg Oral Pre-Cath  . aspirin EC  81 mg Oral Daily  . atorvastatin  40 mg Oral q1800  . carvedilol  6.25 mg Oral BID  . citalopram  40 mg Oral Daily  . clopidogrel  75 mg Oral Daily  . enoxaparin (LOVENOX) injection  40  mg Subcutaneous Q24H  . gabapentin  300 mg Oral QHS  . gabapentin  600 mg Oral q morning - 10a  . insulin aspart  0-5 Units Subcutaneous QHS  . insulin aspart  0-9 Units Subcutaneous TID WC  . isosorbide mononitrate  30 mg Oral Daily  . levothyroxine  25 mcg Oral QAC breakfast  . pantoprazole  40 mg Oral BID  . sodium chloride  3 mL Intravenous Q12H  . temazepam  15 mg Oral QHS   acetaminophen **OR** acetaminophen, baclofen, morphine injection, ondansetron **OR** ondansetron (ZOFRAN) IV, promethazine  Assessment/ Plan:  [name] [name] is a 44 y.o. white female with hypertension, diabetes mellitus for over 20 years, diabetic retinopathy, history of CVA, gastric ulcer, colonic polyps, hyperlipidemia, gastric bypass performed [facility], CVA with mild L sided weakness 2015, carotid artery bypass and depression   1. Chronic kidney disease stage III with proteinuria: chronic kidney disease stage III secondary to diabetic nephropathy and vascular disease. Baseline creatinine of 1.93, eGFR of 31. Has received IV contrast with cardiac catheterization earlier today and then schedule for more contrast exposure on Monday.  - Continue IV fluids: NS at 59m/hr - Continue to monitor volume status.  - Continue to hold enalapril.   2. Hypertension: has a history of hypotension with multiple blood pressure medications.  -  Home regimen of carvedilol, clonidine, enalapril and chlorthalidone.  - off chlorthalidone, do not recommend restarting this medication due to low GFR.  - holding enalapril  3. Anemia of chronic kidney disease: hemoglobin 11.8. No indication for epo.  - history of iron deficiency anemia: will restart ferrous sulfate  4. Hyperlipidemia: she states atorvastatin gives myalgias of lower extremities. We will need to find another agent that patient can tolerate.   5. Diabetes mellitus type II Insulin dependent: off metformin and invokana during hospitalization. These are  not ideal for patient's level of kidney disease.  - Continue glucose control.    LOS: 0 Zoeya Gramajo 10/7/20165:24 PM

## 2014-11-09 NOTE — Progress Notes (Signed)
Paged cardiology due to patient's change in condition. Dr. Haroldine Laws ordered stat EKG, respiratory called and is currently performing. IV metoprolol, nitro drip, heparin per pharmacy ordered and patient will be transferred to the unit. Orders placed, meds will be given. Bed request has been put in.

## 2014-11-09 NOTE — Progress Notes (Signed)
  Called about patient with worsening CP and episode of diaphoresis post cath.   Cath films reviewed and shows 80% LAD lesion pending PCI on Monday.   SBP 150  HR 70s.   ECG done STAT and nos ST changed.   Will move to ICU. Restart heparin. Start IV NTG. Give one dose lopressor 5mg  IV.   Cate Oravec,MD 7:05 PM

## 2014-11-09 NOTE — Progress Notes (Signed)
Patient to have cardiac cath today. Consent and education completed. Fluids started. Per specials cath will be done around 12:30, patient can have clear liquids this AM.

## 2014-11-09 NOTE — Progress Notes (Signed)
Report called to CCU RN, Sharyn Lull. Orderly has been called for transport. Heparin drip has been started and IV metoprolol given. Vitals are stable. Patient is still having chest pressure that is going down her left arm, but has improved from a 9/10 to 6/10. Diaphoresis has improved slightly, but patient states she still feels very hot and is really tired.

## 2014-11-09 NOTE — Progress Notes (Signed)
Initial Nutrition Assessment   INTERVENTION:   Meals and Snacks: Cater to patient preferences on Heart healthy diet order Education: pt verbally understands current heart healthy diet order and is aware of Mrs. Dash to be sent on meal trays at this time.   NUTRITION DIAGNOSIS:   Inadequate oral intake related to acute illness as evidenced by per patient/family report.  GOAL:   Patient will meet greater than or equal to 90% of their needs  MONITOR:    (Energy Intake, Glucose Profile, Anthropometrics, Electrolyte and renal Profile)  REASON FOR ASSESSMENT:   Malnutrition Screening Tool    ASSESSMENT:   Pt admitted with CP. Pt scheduled for cardiac cath Monday 10/10.  Past Medical History  Diagnosis Date  . Colon polyp   . GERD (gastroesophageal reflux disease)   . History of left heart catheterization (LHC)     a. cath 08/09/2003: mild LAD plaque, no obs dzs noted; b. 12/18/10 stress test w/o ischemia.   Marland Kitchen CVA (cerebral vascular accident) ([place])     a. 06/2008-TEE nl LV fxn; b. 01/2013 - notes indicate short run of SVT at that time  . Multiple sclerosis ([place]) 2015  . Syncope   . Chronic systolic CHF (congestive heart failure) ([place])     a. echo 01/29/2013 EF 40-45%, basal anteroseptal & mid anteroseptal segments abnl; b echo 05/2012 EF 50-55%, mildly dilated LA, nl RVSP  . Orthostatic hypotension   . Aortic arch aneurysm ([place])   . NICM (nonischemic cardiomyopathy) ([place])   . MS (mitral stenosis)   . Malignant melanoma of skin of scalp ([place])   . Hypertension   . Depression   . Diabetes mellitus without complication ([place])   . Headache     Migrains  . Hyperlipidemia   . CKD (chronic kidney disease), stage III   . Hypothyroidism   . Anxiety      Diet Order:  Diet Heart Room service appropriate?: Yes; Fluid consistency:: Thin    Current Nutrition: Pt reports having just ordered lunch of chicken and cooked carrots. Pt ate 90-100% of CL tray this am.    Food/Nutrition-Related History: Pt reports appetite PTA usually really good some days and not so good other days. Pt reports not eating breakfast but usually eats later in the day and has bedtime snacks.    Medications: SS novolog, protonix, NS at 166mL/hr  Electrolyte/Renal Profile and Glucose Profile:   Recent Labs Lab 11/07/14 1802 11/09/14 0454  NA 134* 140  K 3.9 4.4  CL 106 111  CO2 20* 23  BUN 24* 27*  CREATININE 2.00* 1.76*  CALCIUM 8.4* 8.8*  GLUCOSE 234* 110*   Protein Profile: No results for input(s): ALBUMIN in the last 168 hours.  Gastrointestinal Profile: Last BM:  11/07/2014  Weight Change: Pt reports a little bit of weight loss probably recently. Per CHL weight loss of 2% in 4 months.   Skin:  Reviewed, no issues  Height:   Ht Readings from Last 1 Encounters:  11/09/14 5\' 3"  (1.6 m)    Weight:   Wt Readings from Last 1 Encounters:  11/09/14 176 lb 1.6 oz (79.878 kg)    Wt Readings from Last 10 Encounters:  11/09/14 176 lb 1.6 oz (79.878 kg)  10/02/14 177 lb 6.4 oz (80.468 kg)  09/14/14 171 lb (77.565 kg)  08/31/14 171 lb 6.4 oz (77.747 kg)  08/29/14 179 lb (81.194 kg)  08/20/14 173 lb (78.472 kg)  07/17/14 186 lb (84.369 kg)  07/10/14 182 lb  9.6 oz (82.827 kg)  06/19/14 178 lb (80.74 kg)  06/03/14 178 lb (80.74 kg)    BMI:  Body mass index is 31.2 kg/(m^2).   LOW Care Level  Dwyane Luo, [place], [place] Pager [phone]

## 2014-11-09 NOTE — Progress Notes (Signed)
SUBJECTIVE:   She continues to have substernal chest tightness.   Filed Vitals:   11/08/14 1817 11/08/14 1950 11/09/14 0137 11/09/14 0420  BP: 157/73 143/74 133/75 128/60  Pulse: 86 68 59 65  Temp:  98.4 F (36.9 C)  97.9 F (36.6 C)  TempSrc:  Oral  Oral  Resp:  16  18  Height:      Weight:      SpO2:  99%  98%    Intake/Output Summary (Last 24 hours) at 11/09/14 0836 Last data filed at 11/09/14 0640  Gross per 24 hour  Intake    480 ml  Output      0 ml  Net    480 ml    LABS: Basic Metabolic Panel:  Recent Labs  11/07/14 1802 11/09/14 0454  NA 134* 140  K 3.9 4.4  CL 106 111  CO2 20* 23  GLUCOSE 234* 110*  BUN 24* 27*  CREATININE 2.00* 1.76*  CALCIUM 8.4* 8.8*   Liver Function Tests: No results for input(s): AST, ALT, ALKPHOS, BILITOT, PROT, ALBUMIN in the last 72 hours. No results for input(s): LIPASE, AMYLASE in the last 72 hours. CBC:  Recent Labs  11/07/14 1802  WBC 10.4  HGB 11.8*  HCT 36.1  MCV 78.7*  PLT 293   Cardiac Enzymes:  Recent Labs  11/07/14 1802 11/08/14 0045 11/08/14 0658  TROPONINI 0.04* 0.04* <0.03   BNP: Invalid input(s): POCBNP D-Dimer: No results for input(s): DDIMER in the last 72 hours. Hemoglobin A1C: No results for input(s): HGBA1C in the last 72 hours. Fasting Lipid Panel:  Recent Labs  11/09/14 0454  CHOL 164  HDL 27*  LDLCALC 85  TRIG 259*  CHOLHDL 6.1   Thyroid Function Tests: No results for input(s): TSH, T4TOTAL, T3FREE, THYROIDAB in the last 72 hours.  Invalid input(s): FREET3 Anemia Panel: No results for input(s): VITAMINB12, FOLATE, FERRITIN, TIBC, IRON, RETICCTPCT in the last 72 hours.   PHYSICAL EXAM General: Well developed, well nourished, in no acute distress HEENT:  Normocephalic and atramatic Neck:  No JVD.  Lungs: Clear bilaterally to auscultation and percussion. Heart: HRRR . Normal S1 and S2 without gallops or murmurs.  Abdomen: Bowel sounds are positive, abdomen soft and  non-tender  Msk:  Back normal, normal gait. Normal strength and tone for age. Extremities: No clubbing, cyanosis or edema.   Neuro: Alert and oriented X 3. Psych:  Good affect, responds appropriately  TELEMETRY: Reviewed telemetry pt in normal sinus rhythm    ASSESSMENT AND PLAN:   1. Elevated troponin with atypical chest pain: -Mildly elevated at 0.04-->0.04-->0.03, likely supply demand ischemia in the setting of her hypotension and CKD stage IV -Chest pain is atypical in nature but she is extremely worried about this. She underwent a nuclear stress test which was overall suboptimal due to GI uptake but did not show convincing evidence of ischemia. The patient wants a definitive answer and she is extremely worried because her father had previous cardiac events after negative stress testing. I explained to the risk of contrast nephropathy that she is aware of this and wants to proceed. I also discussed the different risks associated with cardiac catheterization. Will hydrate the patient today and plan on doing the cardiac cath later today. She is otherwise on optimal medical therapy for coronary artery disease   2.Essential hypertension : Blood pressure was running low but seems to be optimal now.  3. History of previous strokes: -Continue aspirin and Plavix  4.  CKD stage IV: Creatinine improved.   5. History of left subclavian artery bypass:   6. DM2: -Per IM  7. HLD: -Statin  Kathlyn Sacramento, MD, [facility] 11/09/2014 8:36 AM

## 2014-11-09 NOTE — Progress Notes (Signed)
ANTICOAGULATION CONSULT NOTE - Initial Consult  Pharmacy Consult for Heparin Indication: chest pain/ACS  Allergies  Allergen Reactions  . Tramadol Other (See Comments)    Reaction:  Burning of pts mouth and confusion     Patient Measurements: Height: 5\' 3"  (160 cm) Weight: 176 lb 1.6 oz (79.878 kg) IBW/kg (Calculated) : 52.4 Heparin Dosing Weight: 69.8 kg   Vital Signs: Temp: 98.3 F (36.8 C) (10/07 1918) Temp Source: Oral (10/07 1918) BP: 145/86 mmHg (10/07 1918) Pulse Rate: 69 (10/07 1918)  Labs:  Recent Labs  11/07/14 1802 11/08/14 0045 11/08/14 0658 11/09/14 0454  HGB 11.8*  --   --   --   HCT 36.1  --   --   --   PLT 293  --   --   --   CREATININE 2.00*  --   --  1.76*  TROPONINI 0.04* 0.04* <0.03  --     Estimated Creatinine Clearance: 40 mL/min (by C-G formula based on Cr of 1.76).   Medical History: Past Medical History  Diagnosis Date  . Colon polyp   . GERD (gastroesophageal reflux disease)   . History of left heart catheterization (LHC)     a. cath 08/09/2003: mild LAD plaque, no obs dzs noted; b. 12/18/10 stress test w/o ischemia.   Marland Kitchen CVA (cerebral vascular accident) ([place])     a. 06/2008-TEE nl LV fxn; b. 01/2013 - notes indicate short run of SVT at that time  . Multiple sclerosis ([place]) 2015  . Syncope   . Chronic systolic CHF (congestive heart failure) ([place])     a. echo 01/29/2013 EF 40-45%, basal anteroseptal & mid anteroseptal segments abnl; b echo 05/2012 EF 50-55%, mildly dilated LA, nl RVSP  . Orthostatic hypotension   . Aortic arch aneurysm ([place])   . NICM (nonischemic cardiomyopathy) ([place])   . MS (mitral stenosis)   . Malignant melanoma of skin of scalp ([place])   . Hypertension   . Depression   . Diabetes mellitus without complication ([place])   . Headache     Migrains  . Hyperlipidemia   . CKD (chronic kidney disease), stage III   . Hypothyroidism   . Anxiety     Medications:  Facility-administered medications prior to admission   Medication Dose Route Frequency Provider Last Rate Last Dose  . methylPREDNISolone sodium succinate (SOLU-MEDROL) 500 mg in sodium chloride 0.9 % 100 mL IVPB  500 mg Intravenous Continuous Marcial Pacas, MD 0 mL/hr at 06/07/12 1600 250 mg at 06/07/12 1836  . valproate (DEPACON) 500 mg in sodium chloride 0.9 % 100 mL IVPB  500 mg Intravenous Continuous Marcial Pacas, MD 105 mL/hr at 06/07/12 1450 500 mg at 06/07/12 1450   Prescriptions prior to admission  Medication Sig Dispense Refill Last Dose  . aspirin EC 81 MG tablet Take 81 mg by mouth daily.   11/07/2014 at 0600  . baclofen (LIORESAL) 10 MG tablet Take 10 mg by mouth 3 (three) times daily as needed for muscle spasms.    Past Week at Unknown time  . Brexpiprazole 2 MG TABS Take 2 mg by mouth daily. One tablet daily. 30 tablet 3 11/07/2014 at Unknown time  . canagliflozin (INVOKANA) 300 MG TABS tablet Take 300 mg by mouth daily.   11/07/2014 at Unknown time  . carvedilol (COREG) 12.5 MG tablet Take 12.5 mg by mouth 2 (two) times daily.   11/07/2014 at 0600  . citalopram (CELEXA) 40 MG tablet Take 1 tablet (40 mg total)  by mouth daily. 30 tablet 3 11/07/2014 at Unknown time  . cloNIDine (CATAPRES) 0.2 MG tablet Take 0.2 mg by mouth at bedtime.   11/06/2014 at Unknown time  . clopidogrel (PLAVIX) 75 MG tablet Take 75 mg by mouth daily.   11/07/2014 at 0600  . enalapril (VASOTEC) 5 MG tablet Take 5 mg by mouth daily.   11/07/2014 at Unknown time  . gabapentin (NEURONTIN) 300 MG capsule Take 300-600 mg by mouth 2 (two) times daily. Pt takes two in the morning and one at bedtime.   11/07/2014 at Unknown time  . insulin aspart (NOVOLOG) 100 UNIT/ML injection Inject 4-15 Units into the skin 3 (three) times daily as needed for high blood sugar. Pt uses per sliding scale.   PRN at PRN  . insulin detemir (LEVEMIR) 100 UNIT/ML injection Inject 0.25 mLs (25 Units total) into the skin as needed. (Patient taking differently: Inject 25 Units into the skin at bedtime as  needed (for high blood sugar). ) 10 mL 1 Past Week at Unknown time  . levothyroxine (SYNTHROID, LEVOTHROID) 25 MCG tablet Take 25 mcg by mouth daily before breakfast.   11/07/2014 at Unknown time  . metFORMIN (GLUCOPHAGE) 1000 MG tablet Take 1,000 mg by mouth 2 (two) times daily.   11/07/2014 at Unknown time  . pantoprazole (PROTONIX) 40 MG tablet Take 40 mg by mouth 2 (two) times daily.    11/07/2014 at Unknown time  . promethazine (PHENERGAN) 25 MG tablet Take 25 mg by mouth every 6 (six) hours as needed for nausea or vomiting.   11/07/2014 at Unknown time  . temazepam (RESTORIL) 15 MG capsule Take 15 mg by mouth at bedtime.   11/06/2014 at Unknown time  . Brexpiprazole (REXULTI) 1 MG TABS Take 1 mg by mouth every morning. (Patient not taking: Reported on 11/07/2014) 30 tablet 1 Taking    Assessment: Pharmacy consulted to dose heparin in this 44 yr old female with ACS.  CrCl = 40 ml/min No prior anticoagulation noted.   Goal of Therapy:  Heparin level 0.3-0.7 units/ml Monitor platelets by anticoagulation protocol: Yes   Plan:  Will order Heparin 850 units IV X 1 bolus and then start Heparin gtt @ 850 units/hr. Will draw 1st HL 6 hrs after start of drip on 10/8 @ 1:30.   Raymond Azure D 11/09/2014,7:56 PM

## 2014-11-09 NOTE — Progress Notes (Signed)
Dr. Manuella Ghazi notified of patient's 2 second pause. No new orders. MD stated if it reoccurs more to call back. Will continue to assess. Patient was asymptomatic.

## 2014-11-10 LAB — BASIC METABOLIC PANEL
Anion gap: 3 — ABNORMAL LOW (ref 5–15)
BUN: 26 mg/dL — AB (ref 6–20)
CO2: 23 mmol/L (ref 22–32)
CREATININE: 1.58 mg/dL — AB (ref 0.44–1.00)
Calcium: 8.7 mg/dL — ABNORMAL LOW (ref 8.9–10.3)
Chloride: 113 mmol/L — ABNORMAL HIGH (ref 101–111)
GFR, EST AFRICAN AMERICAN: 44 mL/min — AB (ref >60)
GFR, EST NON AFRICAN AMERICAN: 38 mL/min — AB (ref >60)
Glucose, Bld: 119 mg/dL — ABNORMAL HIGH (ref 65–99)
Potassium: 4.5 mmol/L (ref 3.5–5.1)
SODIUM: 139 mmol/L (ref 135–145)

## 2014-11-10 LAB — CBC
HCT: 30.7 % — ABNORMAL LOW (ref 35.0–47.0)
HEMOGLOBIN: 9.9 g/dL — AB (ref 12.0–16.0)
MCH: 25.2 pg — ABNORMAL LOW (ref 26.0–34.0)
MCHC: 32.1 g/dL (ref 32.0–36.0)
MCV: 78.5 fL — ABNORMAL LOW (ref 80.0–100.0)
PLATELETS: 212 10*3/uL (ref 150–440)
RBC: 3.91 MIL/uL (ref 3.80–5.20)
RDW: 14.5 % (ref 11.5–14.5)
WBC: 10.1 10*3/uL (ref 3.6–11.0)

## 2014-11-10 LAB — HEPARIN LEVEL (UNFRACTIONATED)
HEPARIN UNFRACTIONATED: 0.12 [IU]/mL — AB (ref 0.30–0.70)
HEPARIN UNFRACTIONATED: 0.14 [IU]/mL — AB (ref 0.30–0.70)
HEPARIN UNFRACTIONATED: 0.2 [IU]/mL — AB (ref 0.30–0.70)

## 2014-11-10 LAB — GLUCOSE, CAPILLARY
GLUCOSE-CAPILLARY: 228 mg/dL — AB (ref 65–99)
GLUCOSE-CAPILLARY: 151 mg/dL — AB (ref 65–99)
Glucose-Capillary: 111 mg/dL — ABNORMAL HIGH (ref 65–99)
Glucose-Capillary: 123 mg/dL — ABNORMAL HIGH (ref 65–99)

## 2014-11-10 MED ORDER — HEPARIN BOLUS VIA INFUSION
2100.0000 [IU] | Freq: Once | INTRAVENOUS | Status: AC
  Administered 2014-11-10: 2100 [IU] via INTRAVENOUS
  Filled 2014-11-10: qty 2100

## 2014-11-10 MED ORDER — HEPARIN BOLUS VIA INFUSION
1050.0000 [IU] | Freq: Once | INTRAVENOUS | Status: AC
  Administered 2014-11-10: 1050 [IU] via INTRAVENOUS
  Filled 2014-11-10: qty 1050

## 2014-11-10 MED ORDER — LOPERAMIDE HCL 2 MG PO CAPS
2.0000 mg | ORAL_CAPSULE | ORAL | Status: DC | PRN
  Administered 2014-11-10 – 2014-11-12 (×2): 2 mg via ORAL
  Filled 2014-11-10 (×2): qty 1

## 2014-11-10 MED ORDER — BREXPIPRAZOLE 2 MG PO TABS
1.0000 | ORAL_TABLET | Freq: Every day | ORAL | Status: DC
  Filled 2014-11-10: qty 1

## 2014-11-10 MED ORDER — INFLUENZA VAC SPLIT QUAD 0.5 ML IM SUSY
0.5000 mL | PREFILLED_SYRINGE | INTRAMUSCULAR | Status: AC
  Administered 2014-11-11: 0.5 mL via INTRAMUSCULAR
  Filled 2014-11-10: qty 0.5

## 2014-11-10 MED ORDER — BREXPIPRAZOLE 2 MG PO TABS
2.0000 mg | ORAL_TABLET | Freq: Every day | ORAL | Status: DC
  Administered 2014-11-10 – 2014-11-12 (×3): 2 mg via ORAL
  Filled 2014-11-10: qty 1

## 2014-11-10 MED ORDER — ATORVASTATIN CALCIUM 20 MG PO TABS
80.0000 mg | ORAL_TABLET | Freq: Every day | ORAL | Status: DC
  Filled 2014-11-10: qty 4

## 2014-11-10 NOTE — Progress Notes (Signed)
[facility] [place] NAME: [name]    MR#:  [id number]  DATE OF BIRTH:  20-Jul-1970  SUBJECTIVE:  CHIEF COMPLAINT:   Chief Complaint  Patient presents with  . Chest Pain   Admitted for chest pain. Had normal Myoview. cath showing severe LAD. Waiting for repeat catheterization on Monday for PCI. Stage procedure due to CKD stage III. Transferred to ICU overnight due to recurring chest pain. Placed on nitro drip and heparin drip by cardiology.  REVIEW OF SYSTEMS:  Review of Systems  Constitutional: Negative for fever, weight loss, malaise/fatigue and diaphoresis.  HENT: Negative for ear discharge, ear pain, hearing loss, nosebleeds, sore throat and tinnitus.   Eyes: Negative for blurred vision and pain.  Respiratory: Negative for cough, hemoptysis, shortness of breath and wheezing.   Cardiovascular: Positive for chest pain. Negative for palpitations, orthopnea and leg swelling.  Gastrointestinal: Negative for heartburn, nausea, vomiting, abdominal pain, diarrhea, constipation and blood in stool.  Genitourinary: Negative for dysuria, urgency and frequency.  Musculoskeletal: Negative for myalgias and back pain.  Skin: Negative for itching and rash.  Neurological: Negative for dizziness, tingling, tremors, focal weakness, seizures, weakness and headaches.  Psychiatric/Behavioral: Negative for depression. The patient is not nervous/anxious.    DRUG ALLERGIES:   Allergies  Allergen Reactions  . Tramadol Other (See Comments)    Reaction:  Burning of pts mouth and confusion    VITALS:  Blood pressure 113/56, pulse 81, temperature 98.4 F (36.9 C), temperature source Oral, resp. rate 11, height 5\' 3"  (1.6 m), weight 79.878 kg (176 lb 1.6 oz), SpO2 97 %. PHYSICAL EXAMINATION:  Physical Exam  Constitutional: She is oriented to person, place, and time and well-developed, well-nourished, and in no distress.  HENT:  Head:  Normocephalic and atraumatic.  Eyes: Conjunctivae and EOM are normal. Pupils are equal, round, and reactive to light.  Neck: Normal range of motion. Neck supple. No tracheal deviation present. No thyromegaly present.  Cardiovascular: Normal rate, regular rhythm and normal heart sounds.   Pulmonary/Chest: Effort normal and breath sounds normal. No respiratory distress. She has no wheezes. She exhibits no tenderness.  Abdominal: Soft. Bowel sounds are normal. She exhibits no distension. There is no tenderness.  Musculoskeletal: Normal range of motion.  Lt chest tenderness  Neurological: She is alert and oriented to person, place, and time. No cranial nerve deficit.  Skin: Skin is warm and dry. No rash noted.  Psychiatric: Mood and affect normal.   LABORATORY PANEL:   CBC  Recent Labs Lab 11/10/14 0214  WBC 10.1  HGB 9.9*  HCT 30.7*  PLT 212   ------------------------------------------------------------------------------------------------------------------ Chemistries   Recent Labs Lab 11/10/14 0214  NA 139  K 4.5  CL 113*  CO2 23  GLUCOSE 119*  BUN 26*  CREATININE 1.58*  CALCIUM 8.7*   RADIOLOGY:  No results found. ASSESSMENT AND PLAN:  44 year old female with past medical history of coronary disease, multiple sclerosis, hypertension, hyperlipidemia, anxiety, chronic kidney disease stage III, 2 diabetes without complication, who presents to the hospital due to chest pain with mildly elevated troponin.  * Unstable angina-patient does have risk factors given her history of diabetes, history of coronary artery disease.  Stress test neg. Due to her persistent symptoms - Cardiac catheterization performed. Showed - Severe mid LAD lesion estimated at 85-90%, long, tubular stenosis. On aspirin, Plavix, atorvastatin, heparin drip, Coreg and Imdur. Continue nitro drip. Waiting for PCI on Monday.  * Hypothyroidism-continue  Synthroid.  * Type 2 diabetes without  complication-continue NovoLog sliding scale. Stop Invokana due to renal function. Metformin stopped.  * Diabetic neuropathy-continue Neurontin.  * GERD-continue Protonix.  * Depression-continue Celexa/Restoril  * CKD 4 Creatinine improving with IV fluids. Will get contrast again with repeat catheterization.   All the records are reviewed and case discussed with Care Management/Social Worker. Management plans discussed with the patient and in agreement.  CODE STATUS: Full Code  TOTAL TIME TAKING CARE OF THIS PATIENT: 35 minutes.   More than 50% of the time was spent in counseling/coordination of care: Renne Crigler R M.D on 11/10/2014 at 11:19 AM  Between 7am to 6pm - Pager - [phone]  After 6pm go to [email or web address] - password [email or web address] [facility]  Office  [phone]  CC: Primary care physician; [name], MD

## 2014-11-10 NOTE — Progress Notes (Signed)
Nitro stopped for apprx 2.5 hrs since shift change, since pt pain free and BP stable, however restarted due to pt c/o pressure returning to chest.  Rounding physician present and aware.  Restarted at previous rate

## 2014-11-10 NOTE — Progress Notes (Signed)
[facility]  ROUNDING NOTE   Subjective:   Transferred to CCU due to chest pain. Restarted on heparin gtt.   Creatinine 1.58 (1.76) Objective:  Vital signs in last 24 hours:  Temp:  [98.2 F (36.8 C)-98.9 F (37.2 C)] 98.4 F (36.9 C) (10/08 0700) Pulse Rate:  [63-84] 63 (10/08 0800) Resp:  [13-20] 14 (10/08 0800) BP: (108-169)/(56-87) 149/81 mmHg (10/08 0800) SpO2:  [93 %-100 %] 98 % (10/08 0800) Weight:  [79.878 kg (176 lb 1.6 oz)] 79.878 kg (176 lb 1.6 oz) (10/07 0906)  Weight change:  Filed Weights   11/07/14 1803 11/09/14 0906  Weight: 81.194 kg (179 lb) 79.878 kg (176 lb 1.6 oz)    Intake/Output: I/O last 3 completed shifts: In: 2677.7 [P.O.:840; I.V.:1837.7] Out: 200 [Urine:200]   Intake/Output this shift:  Total I/O In: 151.9 [I.V.:151.9] Out: 500 [Urine:500]  Physical Exam: General: NAD  Head: Normocephalic, atraumatic. Moist oral mucosal membranes  Eyes: Anicteric, PERRL  Neck: Supple, trachea midline  Lungs:  Clear to auscultation  Heart: Regular rate and rhythm  Abdomen:  Soft, nontender,   Extremities: no peripheral edema.  Neurologic: Nonfocal, moving all four extremities  Skin: No lesions       Basic Metabolic Panel:  Recent Labs Lab 11/07/14 1802 11/09/14 0454 11/10/14 0214  NA 134* 140 139  K 3.9 4.4 4.5  CL 106 111 113*  CO2 20* 23 23  GLUCOSE 234* 110* 119*  BUN 24* 27* 26*  CREATININE 2.00* 1.76* 1.58*  CALCIUM 8.4* 8.8* 8.7*    Liver Function Tests: No results for input(s): AST, ALT, ALKPHOS, BILITOT, PROT, ALBUMIN in the last 168 hours. No results for input(s): LIPASE, AMYLASE in the last 168 hours. No results for input(s): AMMONIA in the last 168 hours.  CBC:  Recent Labs Lab 11/07/14 1802 11/10/14 0214  WBC 10.4 10.1  HGB 11.8* 9.9*  HCT 36.1 30.7*  MCV 78.7* 78.5*  PLT 293 212    Cardiac Enzymes:  Recent Labs Lab 11/07/14 1802 11/08/14 0045 11/08/14 0658  TROPONINI 0.04* 0.04* <0.03     BNP: Invalid input(s): POCBNP  CBG:  Recent Labs Lab 11/08/14 1951 11/09/14 0749 11/09/14 1629 11/09/14 2125 11/10/14 0703  GLUCAP 168* 14 249* 120* 111*    Microbiology: Results for orders placed or performed during the hospital encounter of 11/07/14  MRSA PCR Screening     Status: None   Collection Time: 11/09/14  8:00 PM  Result Value Ref Range Status   MRSA by PCR NEGATIVE NEGATIVE Final    Comment:        The GeneXpert MRSA Assay (FDA approved for NASAL specimens only), is one component of a comprehensive MRSA colonization surveillance program. It is not intended to diagnose MRSA infection nor to guide or monitor treatment for MRSA infections.     Coagulation Studies: No results for input(s): LABPROT, INR in the last 72 hours.  Urinalysis: No results for input(s): COLORURINE, LABSPEC, PHURINE, GLUCOSEU, HGBUR, BILIRUBINUR, KETONESUR, PROTEINUR, UROBILINOGEN, NITRITE, LEUKOCYTESUR in the last 72 hours.  Invalid input(s): APPERANCEUR    Imaging: Nm Myocar Multi W/spect W/wall Motion / Ef  11/08/2014    There was no ST segment deviation noted during stress.  This is a low risk study.  The left ventricular ejection fraction is mildly decreased (45-54%).   The study is overall suboptimal due to GI uptake. No convincing evidence  of ischemia or infarcts.     Medications:   . sodium chloride 75 mL/hr  at 11/09/14 2045  . heparin 1,100 Units/hr (11/10/14 0515)  . nitroGLYCERIN 5 mcg/min (11/10/14 0233)   . aspirin  81 mg Oral Pre-Cath  . aspirin EC  81 mg Oral Daily  . atorvastatin  40 mg Oral q1800  . Brexpiprazole  2 mg Oral Daily  . carvedilol  6.25 mg Oral BID  . citalopram  40 mg Oral Daily  . clopidogrel  75 mg Oral Daily  . ferrous sulfate  325 mg Oral Q breakfast  . gabapentin  300 mg Oral QHS  . gabapentin  600 mg Oral q morning - 10a  . insulin aspart  0-5 Units Subcutaneous QHS  . insulin aspart  0-9 Units Subcutaneous TID WC  .  isosorbide mononitrate  30 mg Oral Daily  . levothyroxine  25 mcg Oral QAC breakfast  . nitroGLYCERIN  1 inch Topical Once  . pantoprazole  40 mg Oral BID  . sodium chloride  3 mL Intravenous Q12H  . temazepam  15 mg Oral QHS   acetaminophen **OR** acetaminophen, baclofen, morphine injection, ondansetron **OR** ondansetron (ZOFRAN) IV, promethazine  Assessment/ Plan:  [name] is a 44 y.o. white female with hypertension, diabetes mellitus for over 20 years, diabetic retinopathy, history of CVA, gastric ulcer, colonic polyps, hyperlipidemia, gastric bypass performed [facility], CVA with mild L sided weakness 2015, carotid artery bypass and depression   1. Chronic kidney disease stage III with proteinuria: chronic kidney disease stage III secondary to diabetic nephropathy and vascular disease. Baseline creatinine of 1.93, eGFR of 31. Has received IV contrast with cardiac catheterization earlier today and then schedule for more contrast exposure with further cardiac procedures scheduled.  - Continue IV fluids: NS at 33m/hr - Continue to monitor volume status.  - Continue to hold enalapril.   2. Hypertension: has a history of hypotension with multiple blood pressure medications.  -  Home regimen of carvedilol, clonidine, enalapril and chlorthalidone.  - off chlorthalidone, do not recommend restarting this medication due to low GFR.  - holding enalapril  3. Anemia of chronic kidney disease: hemoglobin 9.9. No indication for epo. Acute drop seems to be dilutional - history of iron deficiency anemia: restarted ferrous sulfate  4. Hyperlipidemia: she states atorvastatin gives myalgias of lower extremities. We will need to find another agent that patient can tolerate.   5. Diabetes mellitus type II Insulin dependent with chronic kidney disease: off metformin and invokana during hospitalization. These are not ideal for patient's level of kidney disease.  - Continue glucose  control.    LOS: [address] [address] and C10/8/20168:43 AM

## 2014-11-10 NOTE — Progress Notes (Signed)
ANTICOAGULATION CONSULT NOTE - Initial Consult  Pharmacy Consult for Heparin Indication: chest pain/ACS  Allergies  Allergen Reactions  . Tramadol Other (See Comments)    Reaction:  Burning of pts mouth and confusion     Patient Measurements: Height: 5\' 3"  (160 cm) Weight: 176 lb 1.6 oz (79.878 kg) IBW/kg (Calculated) : 52.4 Heparin Dosing Weight: 69.8 kg   Vital Signs: Temp: 98.4 F (36.9 C) (10/08 0700) Temp Source: Oral (10/08 0700) BP: 152/74 mmHg (10/08 1000) Pulse Rate: 98 (10/08 1000)  Labs:  Recent Labs  11/07/14 1802 11/08/14 0045 11/08/14 0658 11/09/14 0454 11/10/14 0214 11/10/14 1002  HGB 11.8*  --   --   --  9.9*  --   HCT 36.1  --   --   --  30.7*  --   PLT 293  --   --   --  212  --   HEPARINUNFRC  --   --   --   --  0.12* 0.14*  CREATININE 2.00*  --   --  1.76* 1.58*  --   TROPONINI 0.04* 0.04* <0.03  --   --   --     Estimated Creatinine Clearance: 44.5 mL/min (by C-G formula based on Cr of 1.58).   Medical History: Past Medical History  Diagnosis Date  . Colon polyp   . GERD (gastroesophageal reflux disease)   . History of left heart catheterization (LHC)     a. cath 08/09/2003: mild LAD plaque, no obs dzs noted; b. 12/18/10 stress test w/o ischemia.   Marland Kitchen CVA (cerebral vascular accident) ([place])     a. 06/2008-TEE nl LV fxn; b. 01/2013 - notes indicate short run of SVT at that time  . Multiple sclerosis ([place]) 2015  . Syncope   . Chronic systolic CHF (congestive heart failure) ([place])     a. echo 01/29/2013 EF 40-45%, basal anteroseptal & mid anteroseptal segments abnl; b echo 05/2012 EF 50-55%, mildly dilated LA, nl RVSP  . Orthostatic hypotension   . Aortic arch aneurysm ([place])   . NICM (nonischemic cardiomyopathy) ([place])   . MS (mitral stenosis)   . Malignant melanoma of skin of scalp ([place])   . Hypertension   . Depression   . Diabetes mellitus without complication ([place])   . Headache     Migrains  . Hyperlipidemia   . CKD (chronic kidney  disease), stage III   . Hypothyroidism   . Anxiety     Medications:  Facility-administered medications prior to admission  Medication Dose Route Frequency Provider Last Rate Last Dose  . methylPREDNISolone sodium succinate (SOLU-MEDROL) 500 mg in sodium chloride 0.9 % 100 mL IVPB  500 mg Intravenous Continuous Marcial Pacas, MD 0 mL/hr at 06/07/12 1600 250 mg at 06/07/12 1836  . valproate (DEPACON) 500 mg in sodium chloride 0.9 % 100 mL IVPB  500 mg Intravenous Continuous Marcial Pacas, MD 105 mL/hr at 06/07/12 1450 500 mg at 06/07/12 1450   Prescriptions prior to admission  Medication Sig Dispense Refill Last Dose  . aspirin EC 81 MG tablet Take 81 mg by mouth daily.   11/07/2014 at 0600  . baclofen (LIORESAL) 10 MG tablet Take 10 mg by mouth 3 (three) times daily as needed for muscle spasms.    Past Week at Unknown time  . Brexpiprazole 2 MG TABS Take 2 mg by mouth daily. One tablet daily. 30 tablet 3 11/07/2014 at Unknown time  . canagliflozin (INVOKANA) 300 MG TABS tablet Take 300 mg by  mouth daily.   11/07/2014 at Unknown time  . carvedilol (COREG) 12.5 MG tablet Take 12.5 mg by mouth 2 (two) times daily.   11/07/2014 at 0600  . citalopram (CELEXA) 40 MG tablet Take 1 tablet (40 mg total) by mouth daily. 30 tablet 3 11/07/2014 at Unknown time  . cloNIDine (CATAPRES) 0.2 MG tablet Take 0.2 mg by mouth at bedtime.   11/06/2014 at Unknown time  . clopidogrel (PLAVIX) 75 MG tablet Take 75 mg by mouth daily.   11/07/2014 at 0600  . enalapril (VASOTEC) 5 MG tablet Take 5 mg by mouth daily.   11/07/2014 at Unknown time  . gabapentin (NEURONTIN) 300 MG capsule Take 300-600 mg by mouth 2 (two) times daily. Pt takes two in the morning and one at bedtime.   11/07/2014 at Unknown time  . insulin aspart (NOVOLOG) 100 UNIT/ML injection Inject 4-15 Units into the skin 3 (three) times daily as needed for high blood sugar. Pt uses per sliding scale.   PRN at PRN  . insulin detemir (LEVEMIR) 100 UNIT/ML injection Inject  0.25 mLs (25 Units total) into the skin as needed. (Patient taking differently: Inject 25 Units into the skin at bedtime as needed (for high blood sugar). ) 10 mL 1 Past Week at Unknown time  . levothyroxine (SYNTHROID, LEVOTHROID) 25 MCG tablet Take 25 mcg by mouth daily before breakfast.   11/07/2014 at Unknown time  . metFORMIN (GLUCOPHAGE) 1000 MG tablet Take 1,000 mg by mouth 2 (two) times daily.   11/07/2014 at Unknown time  . pantoprazole (PROTONIX) 40 MG tablet Take 40 mg by mouth 2 (two) times daily.    11/07/2014 at Unknown time  . promethazine (PHENERGAN) 25 MG tablet Take 25 mg by mouth every 6 (six) hours as needed for nausea or vomiting.   11/07/2014 at Unknown time  . temazepam (RESTORIL) 15 MG capsule Take 15 mg by mouth at bedtime.   11/06/2014 at Unknown time  . Brexpiprazole (REXULTI) 1 MG TABS Take 1 mg by mouth every morning. (Patient not taking: Reported on 11/07/2014) 30 tablet 1 Taking    Assessment: Pharmacy consulted to dose heparin in this 44 yr old female with ACS.  CrCl = 40 ml/min No prior anticoagulation noted.   Goal of Therapy:  Heparin level 0.3-0.7 units/ml Monitor platelets by anticoagulation protocol: Yes   Plan:  Heparin level remains below goal at 0.14. Will bolus heparin 2100 units iv and increase infusion rate to 1350 units/hr. Will check another HL in 6 hours.    Ulice Dash D 11/10/2014,11:01 AM

## 2014-11-10 NOTE — Progress Notes (Signed)
Subjectiv No CP  NO SOB   Objective: Filed Vitals:   11/10/14 1000 11/10/14 1100 11/10/14 1200 11/10/14 1300  BP: 152/74 113/56 136/82 163/81  Pulse: 98 81 76 92  Temp:      TempSrc:      Resp: 18 11 19 20   Height:      Weight:      SpO2: 98% 97% 100% 99%   Weight change:   Intake/Output Summary (Last 24 hours) at 11/10/14 1402 Last data filed at 11/10/14 1307  Gross per 24 hour  Intake 3053.62 ml  Output    500 ml  Net 2553.62 ml   Net 2.8 L positive    General: Alert, awake, oriented x3, in no acute distress Neck:  JVP is normal Heart: Regular rate and rhythm, without murmurs, rubs, gallops.  Lungs: Clear to auscultation.  No rales or wheezes. Exemities:  No edema.   Neuro: Grossly intact, nonfocal.  Tel:  SR/ ST   Lab Results: Results for orders placed or performed during the hospital encounter of 11/07/14 (from the past 24 hour(s))  Glucose, capillary     Status: Abnormal   Collection Time: 11/09/14  4:29 PM  Result Value Ref Range   Glucose-Capillary 249 (H) 65 - 99 mg/dL  MRSA PCR Screening     Status: None   Collection Time: 11/09/14  8:00 PM  Result Value Ref Range   MRSA by PCR NEGATIVE NEGATIVE  Glucose, capillary     Status: Abnormal   Collection Time: 11/09/14  9:25 PM  Result Value Ref Range   Glucose-Capillary 120 (H) 65 - 99 mg/dL   Comment 1 Notify RN   CBC     Status: Abnormal   Collection Time: 11/10/14  2:14 AM  Result Value Ref Range   WBC 10.1 3.6 - 11.0 K/uL   RBC 3.91 3.80 - 5.20 MIL/uL   Hemoglobin 9.9 (L) 12.0 - 16.0 g/dL   HCT 30.7 (L) 35.0 - 47.0 %   MCV 78.5 (L) 80.0 - 100.0 fL   MCH 25.2 (L) 26.0 - 34.0 pg   MCHC 32.1 32.0 - 36.0 g/dL   RDW 14.5 11.5 - 14.5 %   Platelets 212 150 - 440 K/uL  Basic metabolic panel     Status: Abnormal   Collection Time: 11/10/14  2:14 AM  Result Value Ref Range   Sodium 139 135 - 145 mmol/L   Potassium 4.5 3.5 - 5.1 mmol/L   Chloride 113 (H) 101 - 111 mmol/L   CO2 23 22 - 32 mmol/L   Glucose, Bld 119 (H) 65 - 99 mg/dL   BUN 26 (H) 6 - 20 mg/dL   Creatinine, Ser 1.58 (H) 0.44 - 1.00 mg/dL   Calcium 8.7 (L) 8.9 - 10.3 mg/dL   GFR calc non Af Amer 38 (L) >60 mL/min   GFR calc Af Amer 44 (L) >60 mL/min   Anion gap 3 (L) 5 - 15  Heparin level (unfractionated)     Status: Abnormal   Collection Time: 11/10/14  2:14 AM  Result Value Ref Range   Heparin Unfractionated 0.12 (L) 0.30 - 0.70 IU/mL  Glucose, capillary     Status: Abnormal   Collection Time: 11/10/14  7:03 AM  Result Value Ref Range   Glucose-Capillary 111 (H) 65 - 99 mg/dL  Heparin level (unfractionated)     Status: Abnormal   Collection Time: 11/10/14 10:02 AM  Result Value Ref Range   Heparin Unfractionated 0.14 (L)  0.30 - 0.70 IU/mL  Glucose, capillary     Status: Abnormal   Collection Time: 11/10/14 11:16 AM  Result Value Ref Range   Glucose-Capillary 228 (H) 65 - 99 mg/dL    Studies/Results: No results found.  Medications:  REviewed    @PROBHOSP @  1  CAD  Cath showed 85 to 90% mid LAD lesion  Plan for cath on Monday with PCI of mid LAD  Dr Josefa Half to perform.   CP last night  None now  Back on IV heparin    2  Renal  Follow  Cr  Cut back on IV to 50 per hour total    3  HTN  BP up a little at times  Labile  Follow  I would not push lower  May compromise coronary flow      4  GI  Diarrhea   Pt thinks may be due to Fe  No one sick at home  Follow  Stop Fe  immodium ordered    LOS: 1 day   Dorris Carnes 11/10/2014, 2:02 PM

## 2014-11-10 NOTE — Progress Notes (Signed)
ANTICOAGULATION CONSULT NOTE - Initial Consult  Pharmacy Consult for Heparin Indication: chest pain/ACS  Allergies  Allergen Reactions  . Tramadol Other (See Comments)    Reaction:  Burning of pts mouth and confusion     Patient Measurements: Height: 5\' 3"  (160 cm) Weight: 176 lb 1.6 oz (79.878 kg) IBW/kg (Calculated) : 52.4 Heparin Dosing Weight: 69.8 kg   Vital Signs: Temp: 98.8 F (37.1 C) (10/08 0030) Temp Source: Oral (10/08 0030) BP: 123/76 mmHg (10/08 0200) Pulse Rate: 78 (10/08 0200)  Labs:  Recent Labs  11/07/14 1802 11/08/14 0045 11/08/14 0658 11/09/14 0454 11/10/14 0214  HGB 11.8*  --   --   --  9.9*  HCT 36.1  --   --   --  30.7*  PLT 293  --   --   --  212  HEPARINUNFRC  --   --   --   --  0.12*  CREATININE 2.00*  --   --  1.76* 1.58*  TROPONINI 0.04* 0.04* <0.03  --   --     Estimated Creatinine Clearance: 44.5 mL/min (by C-G formula based on Cr of 1.58).   Medical History: Past Medical History  Diagnosis Date  . Colon polyp   . GERD (gastroesophageal reflux disease)   . History of left heart catheterization (LHC)     a. cath 08/09/2003: mild LAD plaque, no obs dzs noted; b. 12/18/10 stress test w/o ischemia.   Marland Kitchen CVA (cerebral vascular accident) ([place])     a. 06/2008-TEE nl LV fxn; b. 01/2013 - notes indicate short run of SVT at that time  . Multiple sclerosis ([place]) 2015  . Syncope   . Chronic systolic CHF (congestive heart failure) ([place])     a. echo 01/29/2013 EF 40-45%, basal anteroseptal & mid anteroseptal segments abnl; b echo 05/2012 EF 50-55%, mildly dilated LA, nl RVSP  . Orthostatic hypotension   . Aortic arch aneurysm ([place])   . NICM (nonischemic cardiomyopathy) ([place])   . MS (mitral stenosis)   . Malignant melanoma of skin of scalp ([place])   . Hypertension   . Depression   . Diabetes mellitus without complication ([place])   . Headache     Migrains  . Hyperlipidemia   . CKD (chronic kidney disease), stage III   . Hypothyroidism   .  Anxiety     Medications:  Facility-administered medications prior to admission  Medication Dose Route Frequency Provider Last Rate Last Dose  . methylPREDNISolone sodium succinate (SOLU-MEDROL) 500 mg in sodium chloride 0.9 % 100 mL IVPB  500 mg Intravenous Continuous Marcial Pacas, MD 0 mL/hr at 06/07/12 1600 250 mg at 06/07/12 1836  . valproate (DEPACON) 500 mg in sodium chloride 0.9 % 100 mL IVPB  500 mg Intravenous Continuous Marcial Pacas, MD 105 mL/hr at 06/07/12 1450 500 mg at 06/07/12 1450   Prescriptions prior to admission  Medication Sig Dispense Refill Last Dose  . aspirin EC 81 MG tablet Take 81 mg by mouth daily.   11/07/2014 at 0600  . baclofen (LIORESAL) 10 MG tablet Take 10 mg by mouth 3 (three) times daily as needed for muscle spasms.    Past Week at Unknown time  . Brexpiprazole 2 MG TABS Take 2 mg by mouth daily. One tablet daily. 30 tablet 3 11/07/2014 at Unknown time  . canagliflozin (INVOKANA) 300 MG TABS tablet Take 300 mg by mouth daily.   11/07/2014 at Unknown time  . carvedilol (COREG) 12.5 MG tablet Take 12.5 mg  by mouth 2 (two) times daily.   11/07/2014 at 0600  . citalopram (CELEXA) 40 MG tablet Take 1 tablet (40 mg total) by mouth daily. 30 tablet 3 11/07/2014 at Unknown time  . cloNIDine (CATAPRES) 0.2 MG tablet Take 0.2 mg by mouth at bedtime.   11/06/2014 at Unknown time  . clopidogrel (PLAVIX) 75 MG tablet Take 75 mg by mouth daily.   11/07/2014 at 0600  . enalapril (VASOTEC) 5 MG tablet Take 5 mg by mouth daily.   11/07/2014 at Unknown time  . gabapentin (NEURONTIN) 300 MG capsule Take 300-600 mg by mouth 2 (two) times daily. Pt takes two in the morning and one at bedtime.   11/07/2014 at Unknown time  . insulin aspart (NOVOLOG) 100 UNIT/ML injection Inject 4-15 Units into the skin 3 (three) times daily as needed for high blood sugar. Pt uses per sliding scale.   PRN at PRN  . insulin detemir (LEVEMIR) 100 UNIT/ML injection Inject 0.25 mLs (25 Units total) into the skin as  needed. (Patient taking differently: Inject 25 Units into the skin at bedtime as needed (for high blood sugar). ) 10 mL 1 Past Week at Unknown time  . levothyroxine (SYNTHROID, LEVOTHROID) 25 MCG tablet Take 25 mcg by mouth daily before breakfast.   11/07/2014 at Unknown time  . metFORMIN (GLUCOPHAGE) 1000 MG tablet Take 1,000 mg by mouth 2 (two) times daily.   11/07/2014 at Unknown time  . pantoprazole (PROTONIX) 40 MG tablet Take 40 mg by mouth 2 (two) times daily.    11/07/2014 at Unknown time  . promethazine (PHENERGAN) 25 MG tablet Take 25 mg by mouth every 6 (six) hours as needed for nausea or vomiting.   11/07/2014 at Unknown time  . temazepam (RESTORIL) 15 MG capsule Take 15 mg by mouth at bedtime.   11/06/2014 at Unknown time  . Brexpiprazole (REXULTI) 1 MG TABS Take 1 mg by mouth every morning. (Patient not taking: Reported on 11/07/2014) 30 tablet 1 Taking    Assessment: Pharmacy consulted to dose heparin in this 44 yr old female with ACS.  CrCl = 40 ml/min No prior anticoagulation noted.   Goal of Therapy:  Heparin level 0.3-0.7 units/ml Monitor platelets by anticoagulation protocol: Yes   Plan:  Will order Heparin 850 units IV X 1 bolus and then start Heparin gtt @ 850 units/hr. Will draw 1st HL 6 hrs after start of drip on 10/8 @ 1:30.   10/08 02:00 anti-Xa 0.12. 2100 unit bolus and increase to 1100 units/hr. Recheck in 6 hours.  Kendle Erker S 11/10/2014,3:30 AM

## 2014-11-10 NOTE — Progress Notes (Signed)
ANTICOAGULATION CONSULT NOTE - Initial Consult  Pharmacy Consult for Heparin Indication: chest pain/ACS  Allergies  Allergen Reactions  . Tramadol Other (See Comments)    Reaction:  Burning of pts mouth and confusion     Patient Measurements: Height: 5\' 3"  (160 cm) Weight: 176 lb 1.6 oz (79.878 kg) IBW/kg (Calculated) : 52.4 Heparin Dosing Weight: 69.8 kg   Vital Signs: BP: 171/87 mmHg (10/08 1814) Pulse Rate: 82 (10/08 1814)  Labs:  Recent Labs  11/08/14 0045 11/08/14 0658 11/09/14 0454 11/10/14 0214 11/10/14 1002 11/10/14 1713  HGB  --   --   --  9.9*  --   --   HCT  --   --   --  30.7*  --   --   PLT  --   --   --  212  --   --   HEPARINUNFRC  --   --   --  0.12* 0.14* 0.20*  CREATININE  --   --  1.76* 1.58*  --   --   TROPONINI 0.04* <0.03  --   --   --   --     Estimated Creatinine Clearance: 44.5 mL/min (by C-G formula based on Cr of 1.58).   Medical History: Past Medical History  Diagnosis Date  . Colon polyp   . GERD (gastroesophageal reflux disease)   . History of left heart catheterization (LHC)     a. cath 08/09/2003: mild LAD plaque, no obs dzs noted; b. 12/18/10 stress test w/o ischemia.   Marland Kitchen CVA (cerebral vascular accident) ([place])     a. 06/2008-TEE nl LV fxn; b. 01/2013 - notes indicate short run of SVT at that time  . Multiple sclerosis ([place]) 2015  . Syncope   . Chronic systolic CHF (congestive heart failure) ([place])     a. echo 01/29/2013 EF 40-45%, basal anteroseptal & mid anteroseptal segments abnl; b echo 05/2012 EF 50-55%, mildly dilated LA, nl RVSP  . Orthostatic hypotension   . Aortic arch aneurysm ([place])   . NICM (nonischemic cardiomyopathy) ([place])   . MS (mitral stenosis)   . Malignant melanoma of skin of scalp ([place])   . Hypertension   . Depression   . Diabetes mellitus without complication ([place])   . Headache     Migrains  . Hyperlipidemia   . CKD (chronic kidney disease), stage III   . Hypothyroidism   . Anxiety     Medications:   Facility-administered medications prior to admission  Medication Dose Route Frequency Provider Last Rate Last Dose  . methylPREDNISolone sodium succinate (SOLU-MEDROL) 500 mg in sodium chloride 0.9 % 100 mL IVPB  500 mg Intravenous Continuous Marcial Pacas, MD 0 mL/hr at 06/07/12 1600 250 mg at 06/07/12 1836  . valproate (DEPACON) 500 mg in sodium chloride 0.9 % 100 mL IVPB  500 mg Intravenous Continuous Marcial Pacas, MD 105 mL/hr at 06/07/12 1450 500 mg at 06/07/12 1450   Prescriptions prior to admission  Medication Sig Dispense Refill Last Dose  . aspirin EC 81 MG tablet Take 81 mg by mouth daily.   11/07/2014 at 0600  . baclofen (LIORESAL) 10 MG tablet Take 10 mg by mouth 3 (three) times daily as needed for muscle spasms.    Past Week at Unknown time  . Brexpiprazole 2 MG TABS Take 2 mg by mouth daily. One tablet daily. 30 tablet 3 11/07/2014 at Unknown time  . canagliflozin (INVOKANA) 300 MG TABS tablet Take 300 mg by mouth daily.  11/07/2014 at Unknown time  . carvedilol (COREG) 12.5 MG tablet Take 12.5 mg by mouth 2 (two) times daily.   11/07/2014 at 0600  . citalopram (CELEXA) 40 MG tablet Take 1 tablet (40 mg total) by mouth daily. 30 tablet 3 11/07/2014 at Unknown time  . cloNIDine (CATAPRES) 0.2 MG tablet Take 0.2 mg by mouth at bedtime.   11/06/2014 at Unknown time  . clopidogrel (PLAVIX) 75 MG tablet Take 75 mg by mouth daily.   11/07/2014 at 0600  . enalapril (VASOTEC) 5 MG tablet Take 5 mg by mouth daily.   11/07/2014 at Unknown time  . gabapentin (NEURONTIN) 300 MG capsule Take 300-600 mg by mouth 2 (two) times daily. Pt takes two in the morning and one at bedtime.   11/07/2014 at Unknown time  . insulin aspart (NOVOLOG) 100 UNIT/ML injection Inject 4-15 Units into the skin 3 (three) times daily as needed for high blood sugar. Pt uses per sliding scale.   PRN at PRN  . insulin detemir (LEVEMIR) 100 UNIT/ML injection Inject 0.25 mLs (25 Units total) into the skin as needed. (Patient taking  differently: Inject 25 Units into the skin at bedtime as needed (for high blood sugar). ) 10 mL 1 Past Week at Unknown time  . levothyroxine (SYNTHROID, LEVOTHROID) 25 MCG tablet Take 25 mcg by mouth daily before breakfast.   11/07/2014 at Unknown time  . metFORMIN (GLUCOPHAGE) 1000 MG tablet Take 1,000 mg by mouth 2 (two) times daily.   11/07/2014 at Unknown time  . pantoprazole (PROTONIX) 40 MG tablet Take 40 mg by mouth 2 (two) times daily.    11/07/2014 at Unknown time  . promethazine (PHENERGAN) 25 MG tablet Take 25 mg by mouth every 6 (six) hours as needed for nausea or vomiting.   11/07/2014 at Unknown time  . temazepam (RESTORIL) 15 MG capsule Take 15 mg by mouth at bedtime.   11/06/2014 at Unknown time  . Brexpiprazole (REXULTI) 1 MG TABS Take 1 mg by mouth every morning. (Patient not taking: Reported on 11/07/2014) 30 tablet 1 Taking    Assessment: Pharmacy consulted to dose heparin in this 44 yr old female with ACS.  CrCl = 40 ml/min No prior anticoagulation noted.   Goal of Therapy:  Heparin level 0.3-0.7 units/ml Monitor platelets by anticoagulation protocol: Yes   Plan:  Heparin level remains below goal at 0.20. Will bolus heparin 1050 units iv and increase infusion rate to 1450 units/hr. Will check another HL in 6 hours.    Rebeka Kimble D Jeronimo Hellberg 11/10/2014,7:41 PM

## 2014-11-11 DIAGNOSIS — I10 Essential (primary) hypertension: Secondary | ICD-10-CM

## 2014-11-11 LAB — BASIC METABOLIC PANEL
ANION GAP: 6 (ref 5–15)
BUN: 18 mg/dL (ref 6–20)
CHLORIDE: 112 mmol/L — AB (ref 101–111)
CO2: 23 mmol/L (ref 22–32)
Calcium: 8.7 mg/dL — ABNORMAL LOW (ref 8.9–10.3)
Creatinine, Ser: 1.28 mg/dL — ABNORMAL HIGH (ref 0.44–1.00)
GFR calc non Af Amer: 49 mL/min — ABNORMAL LOW (ref >60)
GFR, EST AFRICAN AMERICAN: 57 mL/min — AB (ref >60)
Glucose, Bld: 124 mg/dL — ABNORMAL HIGH (ref 65–99)
Potassium: 4.2 mmol/L (ref 3.5–5.1)
Sodium: 141 mmol/L (ref 135–145)

## 2014-11-11 LAB — GLUCOSE, CAPILLARY
GLUCOSE-CAPILLARY: 176 mg/dL — AB (ref 65–99)
GLUCOSE-CAPILLARY: 188 mg/dL — AB (ref 65–99)
Glucose-Capillary: 92 mg/dL (ref 65–99)
Glucose-Capillary: 217 mg/dL — ABNORMAL HIGH (ref 65–99)

## 2014-11-11 LAB — HEPARIN LEVEL (UNFRACTIONATED)
HEPARIN UNFRACTIONATED: 0.28 [IU]/mL — AB (ref 0.30–0.70)
Heparin Unfractionated: 0.24 IU/mL — ABNORMAL LOW (ref 0.30–0.70)
Heparin Unfractionated: 0.31 IU/mL (ref 0.30–0.70)

## 2014-11-11 LAB — CBC
HCT: 30.9 % — ABNORMAL LOW (ref 35.0–47.0)
HEMOGLOBIN: 10 g/dL — AB (ref 12.0–16.0)
MCH: 25.4 pg — AB (ref 26.0–34.0)
MCHC: 32.4 g/dL (ref 32.0–36.0)
MCV: 78.6 fL — AB (ref 80.0–100.0)
Platelets: 195 10*3/uL (ref 150–440)
RBC: 3.93 MIL/uL (ref 3.80–5.20)
RDW: 14.3 % (ref 11.5–14.5)
WBC: 8.6 10*3/uL (ref 3.6–11.0)

## 2014-11-11 MED ORDER — CLONIDINE HCL ER 0.1 MG PO TB12
0.1000 mg | ORAL_TABLET | Freq: Every morning | ORAL | Status: DC
  Filled 2014-11-11: qty 1

## 2014-11-11 MED ORDER — ROSUVASTATIN CALCIUM 10 MG PO TABS
10.0000 mg | ORAL_TABLET | Freq: Every day | ORAL | Status: DC
  Administered 2014-11-11: 10 mg via ORAL
  Filled 2014-11-11: qty 1

## 2014-11-11 MED ORDER — HEPARIN BOLUS VIA INFUSION
1100.0000 [IU] | Freq: Once | INTRAVENOUS | Status: AC
  Administered 2014-11-11: 1100 [IU] via INTRAVENOUS
  Filled 2014-11-11: qty 1100

## 2014-11-11 MED ORDER — SODIUM BICARBONATE BOLUS VIA INFUSION
INTRAVENOUS | Status: DC
  Filled 2014-11-11: qty 1

## 2014-11-11 MED ORDER — CARVEDILOL 6.25 MG PO TABS
9.3750 mg | ORAL_TABLET | Freq: Two times a day (BID) | ORAL | Status: DC
  Administered 2014-11-11: 9.3 mg via ORAL
  Administered 2014-11-12 – 2014-11-13 (×3): 9.375 mg via ORAL
  Filled 2014-11-11 (×4): qty 1

## 2014-11-11 MED ORDER — SODIUM CHLORIDE 0.9 % IV SOLN
INTRAVENOUS | Status: DC
  Administered 2014-11-11: via INTRAVENOUS

## 2014-11-11 MED ORDER — SODIUM CHLORIDE 0.9 % IJ SOLN
3.0000 mL | Freq: Two times a day (BID) | INTRAMUSCULAR | Status: DC
Start: 2014-11-11 — End: 2014-11-12
  Administered 2014-11-11: 3 mL via INTRAVENOUS

## 2014-11-11 MED ORDER — SODIUM BICARBONATE 8.4 % IV SOLN
INTRAVENOUS | Status: AC
  Administered 2014-11-12: 06:00:00 via INTRAVENOUS
  Filled 2014-11-11: qty 1000

## 2014-11-11 MED ORDER — ASPIRIN 81 MG PO CHEW: 81.0000 mg | CHEWABLE_TABLET | ORAL | Status: DC

## 2014-11-11 MED ORDER — SODIUM CHLORIDE 0.9 % IJ SOLN: 3.0000 mL | INTRAMUSCULAR | Status: DC | PRN

## 2014-11-11 MED ORDER — CLONIDINE HCL 0.1 MG PO TABS
0.1000 mg | ORAL_TABLET | Freq: Every morning | ORAL | Status: DC
  Administered 2014-11-11 – 2014-11-13 (×3): 0.1 mg via ORAL
  Filled 2014-11-11 (×3): qty 1

## 2014-11-11 MED ORDER — HEPARIN BOLUS VIA INFUSION
1050.0000 [IU] | Freq: Once | INTRAVENOUS | Status: AC
  Administered 2014-11-11: 1050 [IU] via INTRAVENOUS
  Filled 2014-11-11: qty 1050

## 2014-11-11 MED ORDER — SODIUM CHLORIDE 0.9 % IV SOLN: 250.0000 mL | INTRAVENOUS | Status: DC | PRN

## 2014-11-11 MED ORDER — ASPIRIN 81 MG PO CHEW
81.0000 mg | CHEWABLE_TABLET | ORAL | Status: AC
  Administered 2014-11-12: 81 mg via ORAL
  Filled 2014-11-11: qty 1

## 2014-11-11 MED ORDER — SODIUM CHLORIDE 0.9 % WEIGHT BASED INFUSION
1.0000 mL/kg/h | INTRAVENOUS | Status: DC
  Administered 2014-11-11: 1 mL/kg/h via INTRAVENOUS

## 2014-11-11 NOTE — Progress Notes (Signed)
ANTICOAGULATION CONSULT NOTE - Initial Consult  Pharmacy Consult for Heparin Indication: chest pain/ACS  Allergies  Allergen Reactions  . Tramadol Other (See Comments)    Reaction:  Burning of pts mouth and confusion     Patient Measurements: Height: 5\' 3"  (160 cm) Weight: 176 lb 1.6 oz (79.878 kg) IBW/kg (Calculated) : 52.4 Heparin Dosing Weight: 69.8 kg   Vital Signs: Temp: 98.7 F (37.1 C) (10/09 0818) Temp Source: Oral (10/09 0818) BP: 158/100 mmHg (10/09 0818) Pulse Rate: 88 (10/09 0818)  Labs:  Recent Labs  11/09/14 0454 11/10/14 0214  11/10/14 1713 11/11/14 0228 11/11/14 0946  HGB  --  9.9*  --   --  10.0*  --   HCT  --  30.7*  --   --  30.9*  --   PLT  --  212  --   --  195  --   HEPARINUNFRC  --  0.12*  < > 0.20* 0.24* 0.31  CREATININE 1.76* 1.58*  --   --  1.28*  --   < > = values in this interval not displayed.  Estimated Creatinine Clearance: 55 mL/min (by C-G formula based on Cr of 1.28).   Medical History: Past Medical History  Diagnosis Date  . Colon polyp   . GERD (gastroesophageal reflux disease)   . History of left heart catheterization (LHC)     a. cath 08/09/2003: mild LAD plaque, no obs dzs noted; b. 12/18/10 stress test w/o ischemia.   Marland Kitchen CVA (cerebral vascular accident) ([place])     a. 06/2008-TEE nl LV fxn; b. 01/2013 - notes indicate short run of SVT at that time  . Multiple sclerosis ([place]) 2015  . Syncope   . Chronic systolic CHF (congestive heart failure) ([place])     a. echo 01/29/2013 EF 40-45%, basal anteroseptal & mid anteroseptal segments abnl; b echo 05/2012 EF 50-55%, mildly dilated LA, nl RVSP  . Orthostatic hypotension   . Aortic arch aneurysm ([place])   . NICM (nonischemic cardiomyopathy) ([place])   . MS (mitral stenosis)   . Malignant melanoma of skin of scalp ([place])   . Hypertension   . Depression   . Diabetes mellitus without complication ([place])   . Headache     Migrains  . Hyperlipidemia   . CKD (chronic kidney disease), stage  III   . Hypothyroidism   . Anxiety     Medications:  Facility-administered medications prior to admission  Medication Dose Route Frequency Provider Last Rate Last Dose  . methylPREDNISolone sodium succinate (SOLU-MEDROL) 500 mg in sodium chloride 0.9 % 100 mL IVPB  500 mg Intravenous Continuous Marcial Pacas, MD 0 mL/hr at 06/07/12 1600 250 mg at 06/07/12 1836  . valproate (DEPACON) 500 mg in sodium chloride 0.9 % 100 mL IVPB  500 mg Intravenous Continuous Marcial Pacas, MD 105 mL/hr at 06/07/12 1450 500 mg at 06/07/12 1450   Prescriptions prior to admission  Medication Sig Dispense Refill Last Dose  . aspirin EC 81 MG tablet Take 81 mg by mouth daily.   11/07/2014 at 0600  . baclofen (LIORESAL) 10 MG tablet Take 10 mg by mouth 3 (three) times daily as needed for muscle spasms.    Past Week at Unknown time  . Brexpiprazole 2 MG TABS Take 2 mg by mouth daily. One tablet daily. 30 tablet 3 11/07/2014 at Unknown time  . canagliflozin (INVOKANA) 300 MG TABS tablet Take 300 mg by mouth daily.   11/07/2014 at Unknown time  . carvedilol (  COREG) 12.5 MG tablet Take 12.5 mg by mouth 2 (two) times daily.   11/07/2014 at 0600  . citalopram (CELEXA) 40 MG tablet Take 1 tablet (40 mg total) by mouth daily. 30 tablet 3 11/07/2014 at Unknown time  . cloNIDine (CATAPRES) 0.2 MG tablet Take 0.2 mg by mouth at bedtime.   11/06/2014 at Unknown time  . clopidogrel (PLAVIX) 75 MG tablet Take 75 mg by mouth daily.   11/07/2014 at 0600  . enalapril (VASOTEC) 5 MG tablet Take 5 mg by mouth daily.   11/07/2014 at Unknown time  . gabapentin (NEURONTIN) 300 MG capsule Take 300-600 mg by mouth 2 (two) times daily. Pt takes two in the morning and one at bedtime.   11/07/2014 at Unknown time  . insulin aspart (NOVOLOG) 100 UNIT/ML injection Inject 4-15 Units into the skin 3 (three) times daily as needed for high blood sugar. Pt uses per sliding scale.   PRN at PRN  . insulin detemir (LEVEMIR) 100 UNIT/ML injection Inject 0.25 mLs (25  Units total) into the skin as needed. (Patient taking differently: Inject 25 Units into the skin at bedtime as needed (for high blood sugar). ) 10 mL 1 Past Week at Unknown time  . levothyroxine (SYNTHROID, LEVOTHROID) 25 MCG tablet Take 25 mcg by mouth daily before breakfast.   11/07/2014 at Unknown time  . metFORMIN (GLUCOPHAGE) 1000 MG tablet Take 1,000 mg by mouth 2 (two) times daily.   11/07/2014 at Unknown time  . pantoprazole (PROTONIX) 40 MG tablet Take 40 mg by mouth 2 (two) times daily.    11/07/2014 at Unknown time  . promethazine (PHENERGAN) 25 MG tablet Take 25 mg by mouth every 6 (six) hours as needed for nausea or vomiting.   11/07/2014 at Unknown time  . temazepam (RESTORIL) 15 MG capsule Take 15 mg by mouth at bedtime.   11/06/2014 at Unknown time  . Brexpiprazole (REXULTI) 1 MG TABS Take 1 mg by mouth every morning. (Patient not taking: Reported on 11/07/2014) 30 tablet 1 Taking    Assessment: Pharmacy consulted to dose heparin in this 44 yr old female with ACS.  No prior anticoagulation noted.   Goal of Therapy:  Heparin level 0.3-0.7 units/ml Monitor platelets by anticoagulation protocol: Yes   Plan:  Heparin level is at goal so will check a confirmatory level in 6 hours.    Ulice Dash D 11/11/2014,10:23 AM

## 2014-11-11 NOTE — Progress Notes (Signed)
[facility]  ROUNDING NOTE   Subjective:   Chest pain resolved on nitro gtt. Now with headache.  NS at 50  Creatinine 1.28 (1.58) (1.76) Objective:  Vital signs in last 24 hours:  Temp:  [98.3 F (36.8 C)-98.8 F (37.1 C)] 98.7 F (37.1 C) (10/09 0818) Pulse Rate:  [63-98] 88 (10/09 0818) Resp:  [10-22] 12 (10/09 0818) BP: (113-188)/(56-100) 158/100 mmHg (10/09 0818) SpO2:  [94 %-100 %] 99 % (10/09 0818)  Weight change:  Filed Weights   11/07/14 1803 11/09/14 0906  Weight: 81.194 kg (179 lb) 79.878 kg (176 lb 1.6 oz)    Intake/Output: I/O last 3 completed shifts: In: 3690.5 [I.V.:3690.5] Out: 500 [Urine:500]   Intake/Output this shift:  Total I/O In: 19.5 [I.V.:19.5] Out: -   Physical Exam: General: NAD, sitting in chair  Head: Normocephalic, atraumatic. Moist oral mucosal membranes  Eyes: Anicteric, PERRL  Neck: Supple, trachea midline  Lungs:  Clear to auscultation  Heart: Regular rate and rhythm  Abdomen:  Soft, nontender,   Extremities: no peripheral edema.  Neurologic: Nonfocal, moving all four extremities  Skin: No lesions       Basic Metabolic Panel:  Recent Labs Lab 11/07/14 1802 11/09/14 0454 11/10/14 0214 11/11/14 0228  NA 134* 140 139 141  K 3.9 4.4 4.5 4.2  CL 106 111 113* 112*  CO2 20* _0 GLUCOSE 234* 110* 119* 124*  BUN 24* 27* 26* 18  CREATININE 2.00* 1.76* 1.58* 1.28*  CALCIUM 8.4* 8.8* 8.7* 8.7*    Liver Function Tests: No results for input(s): AST, ALT, ALKPHOS, BILITOT, PROT, ALBUMIN in the last 168 hours. No results for input(s): LIPASE, AMYLASE in the last 168 hours. No results for input(s): AMMONIA in the last 168 hours.  CBC:  Recent Labs Lab 11/07/14 1802 11/10/14 0214 11/11/14 0228  WBC 10.4 10.1 8.6  HGB 11.8* 9.9* 10.0*  HCT 36.1 30.7* 30.9*  MCV 78.7* 78.5* 78.6*  PLT 293 212 195    Cardiac Enzymes:  Recent Labs Lab 11/07/14 1802 11/08/14 0045 11/08/14 0658  TROPONINI 0.04*  0.04* <0.03    BNP: Invalid input(s): POCBNP  CBG:  Recent Labs Lab 11/10/14 0703 11/10/14 1116 11/10/14 1611 11/10/14 2126 11/11/14 0729  GLUCAP 111* 228* 151* 123* 40    Microbiology: Results for orders placed or performed during the hospital encounter of 11/07/14  MRSA PCR Screening     Status: None   Collection Time: 11/09/14  8:00 PM  Result Value Ref Range Status   MRSA by PCR NEGATIVE NEGATIVE Final    Comment:        The GeneXpert MRSA Assay (FDA approved for NASAL specimens only), is one component of a comprehensive MRSA colonization surveillance program. It is not intended to diagnose MRSA infection nor to guide or monitor treatment for MRSA infections.     Coagulation Studies: No results for input(s): LABPROT, INR in the last 72 hours.  Urinalysis: No results for input(s): COLORURINE, LABSPEC, PHURINE, GLUCOSEU, HGBUR, BILIRUBINUR, KETONESUR, PROTEINUR, UROBILINOGEN, NITRITE, LEUKOCYTESUR in the last 72 hours.  Invalid input(s): APPERANCEUR    Imaging: No results found.   Medications:   . sodium chloride 50 mL/hr at 11/10/14 1430  . heparin 1,600 Units/hr (11/11/14 9150)  . nitroGLYCERIN 15 mcg/min (11/11/14 0741)   . aspirin EC  81 mg Oral Daily  . atorvastatin  80 mg Oral q1800  . Brexpiprazole  2 mg Oral Daily  . carvedilol  6.25 mg Oral BID  .  citalopram  40 mg Oral Daily  . clopidogrel  75 mg Oral Daily  . gabapentin  300 mg Oral QHS  . gabapentin  600 mg Oral q morning - 10a  . insulin aspart  0-5 Units Subcutaneous QHS  . insulin aspart  0-9 Units Subcutaneous TID WC  . isosorbide mononitrate  30 mg Oral Daily  . levothyroxine  25 mcg Oral QAC breakfast  . nitroGLYCERIN  1 inch Topical Once  . pantoprazole  40 mg Oral BID  . sodium chloride  3 mL Intravenous Q12H  . temazepam  15 mg Oral QHS   acetaminophen **OR** acetaminophen, baclofen, loperamide, morphine injection, ondansetron **OR** ondansetron (ZOFRAN) IV,  promethazine  Assessment/ Plan:  [name] is a 44 y.o. white female with hypertension, diabetes mellitus for over 20 years, diabetic retinopathy, history of CVA, gastric ulcer, colonic polyps, hyperlipidemia, gastric bypass performed [facility], CVA with mild L sided weakness 2015, carotid artery bypass and depression   1. Chronic kidney disease stage III with proteinuria: chronic kidney disease stage III secondary to diabetic nephropathy and vascular disease. Baseline creatinine of 1.93, eGFR of 31 on 10/4. Creatinine improved with IV fluids Has received IV contrast with cardiac catheterization on 10/7 and then schedule for more contrast exposure with further cardiac procedures scheduled.  - Continue IV fluids: NS at 61m/hr - Continue to monitor volume status.  - Continue to hold enalapril.   2. Hypertension: has a history of hypotension with multiple blood pressure medications.  However elevated currently.  -  Home regimen of carvedilol, clonidine, enalapril and chlorthalidone.  - holding enalapril and chlorthalidone.  - Clonidine to be restarted as per cards.   3. Anemia of chronic kidney disease: hemoglobin 10. No indication for epo - especially with ischemic event. - history of iron deficiency anemia: continue ferrous sulfate  4. Hyperlipidemia: she states atorvastatin gives myalgias of lower extremities. We will need to find another agent that patient can tolerate.   5. Diabetes mellitus type II Insulin dependent with chronic kidney disease: off metformin and invokana during hospitalization.  - Continue glucose control.    LOS: 2[place] Doroteo Nickolson 10/9/20169:28 AM

## 2014-11-11 NOTE — Progress Notes (Signed)
Subjective: Pt notes some chest pressure/pain earlier  NTG increased  Nne now.  Diarrhea resolved  Pt thinks t was iron supplement Objective: Filed Vitals:   11/11/14 1015 11/11/14 1045 11/11/14 1100 11/11/14 1200  BP:  150/87 108/71   Pulse: 102 82 86   Temp:    99 F (37.2 C)  TempSrc:    Oral  Resp: 16 18 18    Height:      Weight:      SpO2: 100% 97% 98%    Weight change:   Intake/Output Summary (Last 24 hours) at 11/11/14 1241 Last data filed at 11/11/14 1100  Gross per 24 hour  Intake 1986.11 ml  Output      0 ml  Net 1986.11 ml    General: Alert, awake, oriented x3, in no acute distress Neck:  JVP is normal Heart: Regular rate and rhythm, without murmurs, rubs, gallops.  Lungs: Clear to auscultation.  No rales or wheezes. Exemities:  No edema.   Neuro: Grossly intact, nonfocal.  Tele:  SR/ST   Lab Results: Results for orders placed or performed during the hospital encounter of 11/07/14 (from the past 24 hour(s))  Glucose, capillary     Status: Abnormal   Collection Time: 11/10/14  4:11 PM  Result Value Ref Range   Glucose-Capillary 151 (H) 65 - 99 mg/dL  Heparin level (unfractionated)     Status: Abnormal   Collection Time: 11/10/14  5:13 PM  Result Value Ref Range   Heparin Unfractionated 0.20 (L) 0.30 - 0.70 IU/mL  Glucose, capillary     Status: Abnormal   Collection Time: 11/10/14  9:26 PM  Result Value Ref Range   Glucose-Capillary 123 (H) 65 - 99 mg/dL   Comment 1 Notify RN   Basic metabolic panel     Status: Abnormal   Collection Time: 11/11/14  2:28 AM  Result Value Ref Range   Sodium 141 135 - 145 mmol/L   Potassium 4.2 3.5 - 5.1 mmol/L   Chloride 112 (H) 101 - 111 mmol/L   CO2 23 22 - 32 mmol/L   Glucose, Bld 124 (H) 65 - 99 mg/dL   BUN 18 6 - 20 mg/dL   Creatinine, Ser 1.28 (H) 0.44 - 1.00 mg/dL   Calcium 8.7 (L) 8.9 - 10.3 mg/dL   GFR calc non Af Amer 49 (L) >60 mL/min   GFR calc Af Amer 57 (L) >60 mL/min   Anion gap 6 5 - 15  CBC      Status: Abnormal   Collection Time: 11/11/14  2:28 AM  Result Value Ref Range   WBC 8.6 3.6 - 11.0 K/uL   RBC 3.93 3.80 - 5.20 MIL/uL   Hemoglobin 10.0 (L) 12.0 - 16.0 g/dL   HCT 30.9 (L) 35.0 - 47.0 %   MCV 78.6 (L) 80.0 - 100.0 fL   MCH 25.4 (L) 26.0 - 34.0 pg   MCHC 32.4 32.0 - 36.0 g/dL   RDW 14.3 11.5 - 14.5 %   Platelets 195 150 - 440 K/uL  Heparin level (unfractionated)     Status: Abnormal   Collection Time: 11/11/14  2:28 AM  Result Value Ref Range   Heparin Unfractionated 0.24 (L) 0.30 - 0.70 IU/mL  Glucose, capillary     Status: None   Collection Time: 11/11/14  7:29 AM  Result Value Ref Range   Glucose-Capillary 92 65 - 99 mg/dL  Heparin level (unfractionated)     Status: None   Collection Time:  11/11/14  9:46 AM  Result Value Ref Range   Heparin Unfractionated 0.31 0.30 - 0.70 IU/mL  Glucose, capillary     Status: Abnormal   Collection Time: 11/11/14 11:05 AM  Result Value Ref Range   Glucose-Capillary 176 (H) 65 - 99 mg/dL    Studies/Results: No results found.  Medications: Reviewed  @PROBHOSP @  1  CAD  Plan for PCI tomorrow  Orders written  Continue hydration.  Continue heparin  See 2.    2  HTN  BP is extremely labile  HR goes up as well when gets to commode Will add low dose clonidine 0.1 qd as she may be having rebound   Currently on 6.25 bid   Will increase to 9.375 bid    3.  Renal  Cr improved with hydration  COntinue  4.  Diarrhea  Resolved  5  HL LDL is 85  Needs to be on a statin  Myalgias on lipitor  Will try crestor    LOS: 2 days   Dorris Carnes 11/11/2014, 12:41 PM

## 2014-11-11 NOTE — Progress Notes (Signed)
ANTICOAGULATION CONSULT NOTE - Initial Consult  Pharmacy Consult for Heparin Indication: chest pain/ACS  Allergies  Allergen Reactions  . Tramadol Other (See Comments)    Reaction:  Burning of pts mouth and confusion     Patient Measurements: Height: 5\' 3"  (160 cm) Weight: 176 lb 1.6 oz (79.878 kg) IBW/kg (Calculated) : 52.4 Heparin Dosing Weight: 69.8 kg   Vital Signs: Temp: 98.8 F (37.1 C) (10/09 1900) Temp Source: Oral (10/09 1900) BP: 140/69 mmHg (10/09 1900) Pulse Rate: 97 (10/09 1900)  Labs:  Recent Labs  11/09/14 0454 11/10/14 0214  11/11/14 0228 11/11/14 0946 11/11/14 1617  HGB  --  9.9*  --  10.0*  --   --   HCT  --  30.7*  --  30.9*  --   --   PLT  --  212  --  195  --   --   HEPARINUNFRC  --  0.12*  < > 0.24* 0.31 0.28*  CREATININE 1.76* 1.58*  --  1.28*  --   --   < > = values in this interval not displayed.  Estimated Creatinine Clearance: 55 mL/min (by C-G formula based on Cr of 1.28).   Medical History: Past Medical History  Diagnosis Date  . Colon polyp   . GERD (gastroesophageal reflux disease)   . History of left heart catheterization (LHC)     a. cath 08/09/2003: mild LAD plaque, no obs dzs noted; b. 12/18/10 stress test w/o ischemia.   Marland Kitchen CVA (cerebral vascular accident) ([place])     a. 06/2008-TEE nl LV fxn; b. 01/2013 - notes indicate short run of SVT at that time  . Multiple sclerosis ([place]) 2015  . Syncope   . Chronic systolic CHF (congestive heart failure) ([place])     a. echo 01/29/2013 EF 40-45%, basal anteroseptal & mid anteroseptal segments abnl; b echo 05/2012 EF 50-55%, mildly dilated LA, nl RVSP  . Orthostatic hypotension   . Aortic arch aneurysm ([place])   . NICM (nonischemic cardiomyopathy) ([place])   . MS (mitral stenosis)   . Malignant melanoma of skin of scalp ([place])   . Hypertension   . Depression   . Diabetes mellitus without complication ([place])   . Headache     Migrains  . Hyperlipidemia   . CKD (chronic kidney disease), stage  III   . Hypothyroidism   . Anxiety     Medications:  Facility-administered medications prior to admission  Medication Dose Route Frequency Provider Last Rate Last Dose  . methylPREDNISolone sodium succinate (SOLU-MEDROL) 500 mg in sodium chloride 0.9 % 100 mL IVPB  500 mg Intravenous Continuous Marcial Pacas, MD 0 mL/hr at 06/07/12 1600 250 mg at 06/07/12 1836  . valproate (DEPACON) 500 mg in sodium chloride 0.9 % 100 mL IVPB  500 mg Intravenous Continuous Marcial Pacas, MD 105 mL/hr at 06/07/12 1450 500 mg at 06/07/12 1450   Prescriptions prior to admission  Medication Sig Dispense Refill Last Dose  . aspirin EC 81 MG tablet Take 81 mg by mouth daily.   11/07/2014 at 0600  . baclofen (LIORESAL) 10 MG tablet Take 10 mg by mouth 3 (three) times daily as needed for muscle spasms.    Past Week at Unknown time  . Brexpiprazole 2 MG TABS Take 2 mg by mouth daily. One tablet daily. 30 tablet 3 11/07/2014 at Unknown time  . canagliflozin (INVOKANA) 300 MG TABS tablet Take 300 mg by mouth daily.   11/07/2014 at Unknown time  . carvedilol (  COREG) 12.5 MG tablet Take 12.5 mg by mouth 2 (two) times daily.   11/07/2014 at 0600  . citalopram (CELEXA) 40 MG tablet Take 1 tablet (40 mg total) by mouth daily. 30 tablet 3 11/07/2014 at Unknown time  . cloNIDine (CATAPRES) 0.2 MG tablet Take 0.2 mg by mouth at bedtime.   11/06/2014 at Unknown time  . clopidogrel (PLAVIX) 75 MG tablet Take 75 mg by mouth daily.   11/07/2014 at 0600  . enalapril (VASOTEC) 5 MG tablet Take 5 mg by mouth daily.   11/07/2014 at Unknown time  . gabapentin (NEURONTIN) 300 MG capsule Take 300-600 mg by mouth 2 (two) times daily. Pt takes two in the morning and one at bedtime.   11/07/2014 at Unknown time  . insulin aspart (NOVOLOG) 100 UNIT/ML injection Inject 4-15 Units into the skin 3 (three) times daily as needed for high blood sugar. Pt uses per sliding scale.   PRN at PRN  . insulin detemir (LEVEMIR) 100 UNIT/ML injection Inject 0.25 mLs (25  Units total) into the skin as needed. (Patient taking differently: Inject 25 Units into the skin at bedtime as needed (for high blood sugar). ) 10 mL 1 Past Week at Unknown time  . levothyroxine (SYNTHROID, LEVOTHROID) 25 MCG tablet Take 25 mcg by mouth daily before breakfast.   11/07/2014 at Unknown time  . metFORMIN (GLUCOPHAGE) 1000 MG tablet Take 1,000 mg by mouth 2 (two) times daily.   11/07/2014 at Unknown time  . pantoprazole (PROTONIX) 40 MG tablet Take 40 mg by mouth 2 (two) times daily.    11/07/2014 at Unknown time  . promethazine (PHENERGAN) 25 MG tablet Take 25 mg by mouth every 6 (six) hours as needed for nausea or vomiting.   11/07/2014 at Unknown time  . temazepam (RESTORIL) 15 MG capsule Take 15 mg by mouth at bedtime.   11/06/2014 at Unknown time  . Brexpiprazole (REXULTI) 1 MG TABS Take 1 mg by mouth every morning. (Patient not taking: Reported on 11/07/2014) 30 tablet 1 Taking    Assessment: Pharmacy consulted to dose heparin in this 44 yr old female with ACS.  No prior anticoagulation noted.   Goal of Therapy:  Heparin level 0.3-0.7 units/ml Monitor platelets by anticoagulation protocol: Yes   Plan:  Heparin level is slighlty low at recheck. Will bolus 1050 units and then increase rate to 1750 units. Will recheck in 6 hours.   Sabree Nuon D Anyjah Roundtree 11/11/2014,7:56 PM

## 2014-11-11 NOTE — Progress Notes (Signed)
ANTICOAGULATION CONSULT NOTE - Initial Consult  Pharmacy Consult for Heparin Indication: chest pain/ACS  Allergies  Allergen Reactions  . Tramadol Other (See Comments)    Reaction:  Burning of pts mouth and confusion     Patient Measurements: Height: 5\' 3"  (160 cm) Weight: 176 lb 1.6 oz (79.878 kg) IBW/kg (Calculated) : 52.4 Heparin Dosing Weight: 69.8 kg   Vital Signs: Temp: 98.4 F (36.9 C) (10/09 0048) Temp Source: Oral (10/09 0048) BP: 145/80 mmHg (10/09 0000) Pulse Rate: 79 (10/09 0000)  Labs:  Recent Labs  11/08/14 0658 11/09/14 0454  11/10/14 0214 11/10/14 1002 11/10/14 1713 11/11/14 0228  HGB  --   --   --  9.9*  --   --  10.0*  HCT  --   --   --  30.7*  --   --  30.9*  PLT  --   --   --  212  --   --  195  HEPARINUNFRC  --   --   < > 0.12* 0.14* 0.20* 0.24*  CREATININE  --  1.76*  --  1.58*  --   --  1.28*  TROPONINI <0.03  --   --   --   --   --   --   < > = values in this interval not displayed.  Estimated Creatinine Clearance: 55 mL/min (by C-G formula based on Cr of 1.28).   Medical History: Past Medical History  Diagnosis Date  . Colon polyp   . GERD (gastroesophageal reflux disease)   . History of left heart catheterization (LHC)     a. cath 08/09/2003: mild LAD plaque, no obs dzs noted; b. 12/18/10 stress test w/o ischemia.   Marland Kitchen CVA (cerebral vascular accident) ([place])     a. 06/2008-TEE nl LV fxn; b. 01/2013 - notes indicate short run of SVT at that time  . Multiple sclerosis ([place]) 2015  . Syncope   . Chronic systolic CHF (congestive heart failure) ([place])     a. echo 01/29/2013 EF 40-45%, basal anteroseptal & mid anteroseptal segments abnl; b echo 05/2012 EF 50-55%, mildly dilated LA, nl RVSP  . Orthostatic hypotension   . Aortic arch aneurysm ([place])   . NICM (nonischemic cardiomyopathy) ([place])   . MS (mitral stenosis)   . Malignant melanoma of skin of scalp ([place])   . Hypertension   . Depression   . Diabetes mellitus without complication ([place])    . Headache     Migrains  . Hyperlipidemia   . CKD (chronic kidney disease), stage III   . Hypothyroidism   . Anxiety     Medications:  Facility-administered medications prior to admission  Medication Dose Route Frequency Provider Last Rate Last Dose  . methylPREDNISolone sodium succinate (SOLU-MEDROL) 500 mg in sodium chloride 0.9 % 100 mL IVPB  500 mg Intravenous Continuous Marcial Pacas, MD 0 mL/hr at 06/07/12 1600 250 mg at 06/07/12 1836  . valproate (DEPACON) 500 mg in sodium chloride 0.9 % 100 mL IVPB  500 mg Intravenous Continuous Marcial Pacas, MD 105 mL/hr at 06/07/12 1450 500 mg at 06/07/12 1450   Prescriptions prior to admission  Medication Sig Dispense Refill Last Dose  . aspirin EC 81 MG tablet Take 81 mg by mouth daily.   11/07/2014 at 0600  . baclofen (LIORESAL) 10 MG tablet Take 10 mg by mouth 3 (three) times daily as needed for muscle spasms.    Past Week at Unknown time  . Brexpiprazole 2 MG TABS Take  2 mg by mouth daily. One tablet daily. 30 tablet 3 11/07/2014 at Unknown time  . canagliflozin (INVOKANA) 300 MG TABS tablet Take 300 mg by mouth daily.   11/07/2014 at Unknown time  . carvedilol (COREG) 12.5 MG tablet Take 12.5 mg by mouth 2 (two) times daily.   11/07/2014 at 0600  . citalopram (CELEXA) 40 MG tablet Take 1 tablet (40 mg total) by mouth daily. 30 tablet 3 11/07/2014 at Unknown time  . cloNIDine (CATAPRES) 0.2 MG tablet Take 0.2 mg by mouth at bedtime.   11/06/2014 at Unknown time  . clopidogrel (PLAVIX) 75 MG tablet Take 75 mg by mouth daily.   11/07/2014 at 0600  . enalapril (VASOTEC) 5 MG tablet Take 5 mg by mouth daily.   11/07/2014 at Unknown time  . gabapentin (NEURONTIN) 300 MG capsule Take 300-600 mg by mouth 2 (two) times daily. Pt takes two in the morning and one at bedtime.   11/07/2014 at Unknown time  . insulin aspart (NOVOLOG) 100 UNIT/ML injection Inject 4-15 Units into the skin 3 (three) times daily as needed for high blood sugar. Pt uses per sliding scale.    PRN at PRN  . insulin detemir (LEVEMIR) 100 UNIT/ML injection Inject 0.25 mLs (25 Units total) into the skin as needed. (Patient taking differently: Inject 25 Units into the skin at bedtime as needed (for high blood sugar). ) 10 mL 1 Past Week at Unknown time  . levothyroxine (SYNTHROID, LEVOTHROID) 25 MCG tablet Take 25 mcg by mouth daily before breakfast.   11/07/2014 at Unknown time  . metFORMIN (GLUCOPHAGE) 1000 MG tablet Take 1,000 mg by mouth 2 (two) times daily.   11/07/2014 at Unknown time  . pantoprazole (PROTONIX) 40 MG tablet Take 40 mg by mouth 2 (two) times daily.    11/07/2014 at Unknown time  . promethazine (PHENERGAN) 25 MG tablet Take 25 mg by mouth every 6 (six) hours as needed for nausea or vomiting.   11/07/2014 at Unknown time  . temazepam (RESTORIL) 15 MG capsule Take 15 mg by mouth at bedtime.   11/06/2014 at Unknown time  . Brexpiprazole (REXULTI) 1 MG TABS Take 1 mg by mouth every morning. (Patient not taking: Reported on 11/07/2014) 30 tablet 1 Taking    Assessment: Pharmacy consulted to dose heparin in this 44 yr old female with ACS.  CrCl = 40 ml/min No prior anticoagulation noted.   Goal of Therapy:  Heparin level 0.3-0.7 units/ml Monitor platelets by anticoagulation protocol: Yes   Plan:  Heparin level remains below goal at 0.20. Will bolus heparin 1050 units iv and increase infusion rate to 1450 units/hr. Will check another HL in 6 hours.   10/9 02:30 heparin level 0.24. 1100 unit bolus and increase to 1600 units/hr. Recheck in 6 hours.   Felicity Penix S 11/11/2014,3:41 AM

## 2014-11-11 NOTE — Progress Notes (Signed)
[facility] [place] NAME: [name]    MR#:  [id number]  DATE OF BIRTH:  11-Nov-1970  SUBJECTIVE:  CHIEF COMPLAINT:   Chief Complaint  Patient presents with  . Chest Pain   Admitted for chest pain. Had normal Myoview. cath showing severe LAD. Waiting for repeat catheterization on Monday for PCI. Staged procedure due to CKD stage III. On heparin and nitro drip.  REVIEW OF SYSTEMS:  Review of Systems  Constitutional: Negative for fever, weight loss, malaise/fatigue and diaphoresis.  HENT: Negative for ear discharge, ear pain, hearing loss, nosebleeds, sore throat and tinnitus.   Eyes: Negative for blurred vision and pain.  Respiratory: Negative for cough, hemoptysis, shortness of breath and wheezing.   Cardiovascular: Positive for chest pain. Negative for palpitations, orthopnea and leg swelling.  Gastrointestinal: Negative for heartburn, nausea, vomiting, abdominal pain, diarrhea, constipation and blood in stool.  Genitourinary: Negative for dysuria, urgency and frequency.  Musculoskeletal: Negative for myalgias and back pain.  Skin: Negative for itching and rash.  Neurological: Negative for dizziness, tingling, tremors, focal weakness, seizures, weakness and headaches.  Psychiatric/Behavioral: Negative for depression. The patient is not nervous/anxious.    DRUG ALLERGIES:   Allergies  Allergen Reactions  . Tramadol Other (See Comments)    Reaction:  Burning of pts mouth and confusion    VITALS:  Blood pressure 108/71, pulse 86, temperature 99 F (37.2 C), temperature source Oral, resp. rate 18, height 5\' 3"  (1.6 m), weight 79.878 kg (176 lb 1.6 oz), SpO2 98 %. PHYSICAL EXAMINATION:  Physical Exam  Constitutional: She is oriented to person, place, and time and well-developed, well-nourished, and in no distress.  HENT:  Head: Normocephalic and atraumatic.  Eyes: Conjunctivae and EOM are normal. Pupils are equal,  round, and reactive to light.  Neck: Normal range of motion. Neck supple. No tracheal deviation present. No thyromegaly present.  Cardiovascular: Normal rate, regular rhythm and normal heart sounds.   Pulmonary/Chest: Effort normal and breath sounds normal. No respiratory distress. She has no wheezes. She exhibits no tenderness.  Abdominal: Soft. Bowel sounds are normal. She exhibits no distension. There is no tenderness.  Musculoskeletal: Normal range of motion.  Lt chest tenderness  Neurological: She is alert and oriented to person, place, and time. No cranial nerve deficit.  Skin: Skin is warm and dry. No rash noted.  Psychiatric: Mood and affect normal.   LABORATORY PANEL:   CBC  Recent Labs Lab 11/11/14 0228  WBC 8.6  HGB 10.0*  HCT 30.9*  PLT 195   ------------------------------------------------------------------------------------------------------------------ Chemistries   Recent Labs Lab 11/11/14 0228  NA 141  K 4.2  CL 112*  CO2 23  GLUCOSE 124*  BUN 18  CREATININE 1.28*  CALCIUM 8.7*   RADIOLOGY:  No results found. ASSESSMENT AND PLAN:  44 year old female with past medical history of coronary disease, multiple sclerosis, hypertension, hyperlipidemia, anxiety, chronic kidney disease stage III, 2 diabetes without complication, who presents to the hospital due to chest pain with mildly elevated troponin.  * Unstable angina-patient does have risk factors given her history of diabetes, history of coronary artery disease.  Stress test neg. Due to her persistent symptoms - Cardiac catheterization performed. Showed - Severe mid LAD lesion estimated at 85-90%, long, tubular stenosis. On aspirin, Plavix, atorvastatin, heparin drip, Coreg and Imdur. Continue nitro drip. Waiting for PCI on Monday.  * Hypothyroidism-continue Synthroid.  * Type 2 diabetes without complication-continue NovoLog sliding scale. Stop Invokana  due to renal function. Metformin  stopped.  * Diabetic neuropathy-continue Neurontin.  * GERD-continue Protonix.  * Depression-continue Celexa/Restoril  * CKD 3 Creatinine improving with IV fluids. Will get contrast again with repeat catheterization.   All the records are reviewed and case discussed with Care Management/Social Worker. Management plans discussed with the patient and in agreement.  CODE STATUS: Full Code  TOTAL TIME TAKING CARE OF THIS PATIENT: 25 minutes.     Hillary Bow R M.D on 11/11/2014 at 12:45 PM  Between 7am to 6pm - Pager - [phone]  After 6pm go to [email or web address] - password [email or web address] [facility]  Office  [phone]  CC: Primary care physician; [name], MD

## 2014-11-12 ENCOUNTER — Encounter
Admission: EM | Disposition: A | Discharge status: Home / Self Care | Payer: Self-pay | Source: Home / Self Care | Attending: Internal Medicine

## 2014-11-12 ENCOUNTER — Encounter: Payer: Self-pay | Admitting: Cardiology

## 2014-11-12 HISTORY — PX: CARDIAC CATHETERIZATION: SHX172

## 2014-11-12 LAB — BASIC METABOLIC PANEL
ANION GAP: 5 (ref 5–15)
BUN: 14 mg/dL (ref 6–20)
CALCIUM: 8.7 mg/dL — AB (ref 8.9–10.3)
CO2: 25 mmol/L (ref 22–32)
CREATININE: 1.23 mg/dL — AB (ref 0.44–1.00)
Chloride: 111 mmol/L (ref 101–111)
GFR calc Af Amer: >60 mL/min (ref >60)
GFR, EST NON AFRICAN AMERICAN: 52 mL/min — AB (ref >60)
GLUCOSE: 123 mg/dL — AB (ref 65–99)
Potassium: 4.2 mmol/L (ref 3.5–5.1)
Sodium: 141 mmol/L (ref 135–145)

## 2014-11-12 LAB — HEPARIN LEVEL (UNFRACTIONATED): HEPARIN UNFRACTIONATED: 0.42 [IU]/mL (ref 0.30–0.70)

## 2014-11-12 LAB — GLUCOSE, CAPILLARY
GLUCOSE-CAPILLARY: 105 mg/dL — AB (ref 65–99)
Glucose-Capillary: 188 mg/dL — ABNORMAL HIGH (ref 65–99)
Glucose-Capillary: 226 mg/dL — ABNORMAL HIGH (ref 65–99)
Glucose-Capillary: 238 mg/dL — ABNORMAL HIGH (ref 65–99)

## 2014-11-12 LAB — CBC
HCT: 29.1 % — ABNORMAL LOW (ref 35.0–47.0)
HEMOGLOBIN: 9.7 g/dL — AB (ref 12.0–16.0)
MCH: 26.1 pg (ref 26.0–34.0)
MCHC: 33.2 g/dL (ref 32.0–36.0)
MCV: 78.7 fL — ABNORMAL LOW (ref 80.0–100.0)
PLATELETS: 171 10*3/uL (ref 150–440)
RBC: 3.7 MIL/uL — ABNORMAL LOW (ref 3.80–5.20)
RDW: 14.5 % (ref 11.5–14.5)
WBC: 7.9 10*3/uL (ref 3.6–11.0)

## 2014-11-12 LAB — PROTIME-INR
INR: 1.13
PROTHROMBIN TIME: 14.7 s (ref 11.4–15.0)

## 2014-11-12 LAB — PLATELET COUNT: PLATELETS: 157 10*3/uL (ref 150–440)

## 2014-11-12 SURGERY — CORONARY STENT INTERVENTION
Anesthesia: Moderate Sedation

## 2014-11-12 SURGERY — CORONARY STENT INTERVENTION
Anesthesia: Moderate Sedation | Laterality: Bilateral

## 2014-11-12 MED ORDER — MIDAZOLAM HCL 2 MG/2ML IJ SOLN
INTRAMUSCULAR | Status: DC | PRN
  Administered 2014-11-12 (×2): 1 mg via INTRAVENOUS

## 2014-11-12 MED ORDER — LIDOCAINE HCL (PF) 1 % IJ SOLN
INTRAMUSCULAR | Status: AC
  Filled 2014-11-12: qty 30

## 2014-11-12 MED ORDER — HEPARIN (PORCINE) IN NACL 2-0.9 UNIT/ML-% IJ SOLN
INTRAMUSCULAR | Status: AC
  Filled 2014-11-12: qty 1000

## 2014-11-12 MED ORDER — ONDANSETRON HCL 4 MG/2ML IJ SOLN: 4.0000 mg | Freq: Four times a day (QID) | INTRAMUSCULAR | Status: DC | PRN

## 2014-11-12 MED ORDER — NITROGLYCERIN 1 MG/10 ML FOR IR/CATH LAB
INTRA_ARTERIAL | Status: DC | PRN
  Administered 2014-11-12 (×3): 200 ug via INTRACORONARY

## 2014-11-12 MED ORDER — MIDAZOLAM HCL 2 MG/2ML IJ SOLN
INTRAMUSCULAR | Status: AC
  Filled 2014-11-12: qty 2

## 2014-11-12 MED ORDER — SODIUM CHLORIDE 0.45 % IV SOLN
INTRAVENOUS | Status: DC
  Administered 2014-11-12: 13:00:00 via INTRAVENOUS

## 2014-11-12 MED ORDER — ACETAMINOPHEN 325 MG PO TABS: 650.0000 mg | ORAL_TABLET | ORAL | Status: DC | PRN

## 2014-11-12 MED ORDER — ASPIRIN 81 MG PO CHEW
CHEWABLE_TABLET | ORAL | Status: DC | PRN
  Administered 2014-11-12: 324 mg via ORAL

## 2014-11-12 MED ORDER — SODIUM CHLORIDE 0.9 % IV SOLN: 250.0000 mL | INTRAVENOUS | Status: DC | PRN

## 2014-11-12 MED ORDER — FENTANYL CITRATE (PF) 100 MCG/2ML IJ SOLN
INTRAMUSCULAR | Status: DC | PRN
  Administered 2014-11-12 (×2): 50 ug via INTRAVENOUS

## 2014-11-12 MED ORDER — ROSUVASTATIN CALCIUM 20 MG PO TABS
20.0000 mg | ORAL_TABLET | Freq: Every day | ORAL | Status: DC
  Administered 2014-11-12: 20 mg via ORAL
  Filled 2014-11-12: qty 1

## 2014-11-12 MED ORDER — CLOPIDOGREL BISULFATE 75 MG PO TABS
75.0000 mg | ORAL_TABLET | Freq: Every day | ORAL | Status: DC
  Administered 2014-11-13: 75 mg via ORAL
  Filled 2014-11-12: qty 1

## 2014-11-12 MED ORDER — FENTANYL CITRATE (PF) 100 MCG/2ML IJ SOLN
INTRAMUSCULAR | Status: AC
  Filled 2014-11-12: qty 2

## 2014-11-12 MED ORDER — BIVALIRUDIN BOLUS VIA INFUSION - CUPID
INTRAVENOUS | Status: DC | PRN
  Administered 2014-11-12: 61.725 mg via INTRAVENOUS

## 2014-11-12 MED ORDER — ONDANSETRON HCL 4 MG/2ML IJ SOLN
INTRAMUSCULAR | Status: AC
  Administered 2014-11-12: 4 mg
  Filled 2014-11-12: qty 2

## 2014-11-12 MED ORDER — NITROGLYCERIN 5 MG/ML IV SOLN
INTRAVENOUS | Status: AC
  Filled 2014-11-12: qty 10

## 2014-11-12 MED ORDER — IOHEXOL 300 MG/ML  SOLN
INTRAMUSCULAR | Status: DC | PRN
  Administered 2014-11-12: 160 mL via INTRA_ARTERIAL

## 2014-11-12 MED ORDER — ASPIRIN EC 325 MG PO TBEC
325.0000 mg | DELAYED_RELEASE_TABLET | Freq: Every day | ORAL | Status: DC
Start: 2014-11-12 — End: 2014-11-13
  Administered 2014-11-13: 325 mg via ORAL
  Filled 2014-11-12: qty 1

## 2014-11-12 MED ORDER — CLOPIDOGREL BISULFATE 75 MG PO TABS
ORAL_TABLET | ORAL | Status: DC | PRN
  Administered 2014-11-12: 600 mg via ORAL

## 2014-11-12 MED ORDER — BIVALIRUDIN 250 MG IV SOLR
INTRAVENOUS | Status: AC
  Filled 2014-11-12: qty 250

## 2014-11-12 MED ORDER — BIVALIRUDIN 250 MG IV SOLR
250.0000 mg | INTRAVENOUS | Status: DC | PRN
  Administered 2014-11-12: 1.75 mg/kg/h via INTRAVENOUS

## 2014-11-12 MED ORDER — SODIUM CHLORIDE 0.9 % IJ SOLN: 3.0000 mL | INTRAMUSCULAR | Status: DC | PRN

## 2014-11-12 MED ORDER — SODIUM CHLORIDE 0.9 % IJ SOLN: 3.0000 mL | Freq: Two times a day (BID) | INTRAMUSCULAR | Status: DC

## 2014-11-12 MED ORDER — SODIUM CHLORIDE 0.9 % WEIGHT BASED INFUSION
3.0000 mL/kg/h | INTRAVENOUS | Status: DC
  Administered 2014-11-12 (×2): 3 mL/kg/h via INTRAVENOUS

## 2014-11-12 MED ORDER — ASPIRIN 81 MG PO CHEW
CHEWABLE_TABLET | ORAL | Status: AC
  Filled 2014-11-12: qty 4

## 2014-11-12 MED ORDER — CLOPIDOGREL BISULFATE 75 MG PO TABS
ORAL_TABLET | ORAL | Status: AC
  Filled 2014-11-12: qty 8

## 2014-11-12 SURGICAL SUPPLY — 14 items
BALLN TREK RX 2.5X15 (BALLOONS) ×3
BALLOON TREK RX 2.5X15 (BALLOONS) IMPLANT
CATH VISTA GUIDE 6FR XB3 (CATHETERS) ×2 IMPLANT
CATH VISTA GUIDE 6FR XB3.5 (CATHETERS) ×2 IMPLANT
DEVICE CLOSURE MYNXGRIP 6/7F (Vascular Products) ×2 IMPLANT
DEVICE INFLAT 30 PLUS (MISCELLANEOUS) ×3 IMPLANT
KIT MANI 3VAL PERCEP (MISCELLANEOUS) ×3 IMPLANT
NDL PERC 18GX7CM (NEEDLE) ×1 IMPLANT
NEEDLE PERC 18GX7CM (NEEDLE) ×3 IMPLANT
PACK CARDIAC CATH (CUSTOM PROCEDURE TRAY) ×3 IMPLANT
SHEATH AVANTI 6FR X 11CM (SHEATH) ×3 IMPLANT
STENT XIENCE ALPINE RX 3.25X23 (Permanent Stent) ×2 IMPLANT
WIRE ASAHI PROWATER 180CM (WIRE) ×3 IMPLANT
WIRE EMERALD 3MM-J .035X150CM (WIRE) ×3 IMPLANT

## 2014-11-12 NOTE — Progress Notes (Signed)
Dr. Nadara Mustard with questions regarding bicarb gtt administration, orders received to continue bicarb gtt as currently infusing, no bolus in am and continue gtt prior to Catherization. See [facility] for further documentation, will continue to assess for changes/need.

## 2014-11-12 NOTE — Progress Notes (Signed)
Paged Dr. [name] about order clarification for plavix, aspirin, fluids, diet, and bed status.

## 2014-11-12 NOTE — Progress Notes (Signed)
[facility] [place] NAME: [name]    MR#:  [id number]  DATE OF BIRTH:  05/25/1970  SUBJECTIVE:  CHIEF COMPLAINT:   Chief Complaint  Patient presents with  . Chest Pain   Admitted for chest pain. Had normal Myoview. Cath today with PCI to LAD with DES  REVIEW OF SYSTEMS:  Review of Systems  Constitutional: Negative for fever, weight loss, malaise/fatigue and diaphoresis.  HENT: Negative for ear discharge, ear pain, hearing loss, nosebleeds, sore throat and tinnitus.   Eyes: Negative for blurred vision and pain.  Respiratory: Negative for cough, hemoptysis, shortness of breath and wheezing.   Cardiovascular: Positive for chest pain. Negative for palpitations, orthopnea and leg swelling.  Gastrointestinal: Negative for heartburn, nausea, vomiting, abdominal pain, diarrhea, constipation and blood in stool.  Genitourinary: Negative for dysuria, urgency and frequency.  Musculoskeletal: Negative for myalgias and back pain.  Skin: Negative for itching and rash.  Neurological: Negative for dizziness, tingling, tremors, focal weakness, seizures, weakness and headaches.  Psychiatric/Behavioral: Negative for depression. The patient is not nervous/anxious.    DRUG ALLERGIES:   Allergies  Allergen Reactions  . Tramadol Other (See Comments)    Reaction:  Burning of pts mouth and confusion    VITALS:  Blood pressure 144/74, pulse 68, temperature 97.9 F (36.6 C), temperature source Oral, resp. rate 23, height 5\' 3"  (1.6 m), weight 82.3 kg (181 lb 7 oz), SpO2 96 %. PHYSICAL EXAMINATION:  Physical Exam  Constitutional: She is oriented to person, place, and time and well-developed, well-nourished, and in no distress.  HENT:  Head: Normocephalic and atraumatic.  Eyes: Conjunctivae and EOM are normal. Pupils are equal, round, and reactive to light.  Neck: Normal range of motion. Neck supple. No tracheal deviation present. No  thyromegaly present.  Cardiovascular: Normal rate, regular rhythm and normal heart sounds.   Pulmonary/Chest: Effort normal and breath sounds normal. No respiratory distress. She has no wheezes. She exhibits no tenderness.  Abdominal: Soft. Bowel sounds are normal. She exhibits no distension. There is no tenderness.  Musculoskeletal: Normal range of motion.  Lt chest tenderness  Neurological: She is alert and oriented to person, place, and time. No cranial nerve deficit.  Skin: Skin is warm and dry. No rash noted.  Psychiatric: Mood and affect normal.   LABORATORY PANEL:   CBC  Recent Labs Lab 11/12/14 0333  WBC 7.9  HGB 9.7*  HCT 29.1*  PLT 171   ------------------------------------------------------------------------------------------------------------------ Chemistries   Recent Labs Lab 11/12/14 0333  NA 141  K 4.2  CL 111  CO2 25  GLUCOSE 123*  BUN 14  CREATININE 1.23*  CALCIUM 8.7*   RADIOLOGY:  No results found. ASSESSMENT AND PLAN:  44 year old female with past medical history of coronary disease, multiple sclerosis, hypertension, hyperlipidemia, anxiety, chronic kidney disease stage III, 2 diabetes without complication, who presents to the hospital due to chest pain with mildly elevated troponin.  * Unstable angina-patient does have risk factors given her history of diabetes, history of coronary artery disease.  Stress test neg. Due to her persistent symptoms - Cardiac catheterization performed. Showed - Severe mid LAD lesion estimated at 85-90%, long, tubular stenosis. On aspirin, Plavix, atorvastatin, heparin drip, Coreg and Imdur. PCI to LAD with DES done today.  * Hypothyroidism-continue Synthroid.  * Type 2 diabetes without complication-continue NovoLog sliding scale. Stop Invokana due to renal function. Metformin stopped.  * Diabetic neuropathy-continue Neurontin.  * GERD-continue Protonix.  *  Depression-continue Celexa/Restoril  * CKD  3 Place on IVF. repeat labs in AM   All the records are reviewed and case discussed with Care Management/Social Worker. Management plans discussed with the patient and in agreement.  CODE STATUS: Full Code  TOTAL TIME TAKING CARE OF THIS PATIENT: 25 minutes.   Discharge home tomorrow  Hillary Bow R M.D on 11/12/2014 at 11:58 AM  Between 7am to 6pm - Pager - [phone]  After 6pm go to [email or web address] - password [email or web address]  Office  [phone]  CC: Primary care physician; [name], MD

## 2014-11-12 NOTE — Progress Notes (Signed)
[facility]  ROUNDING NOTE   Subjective:  Pt just had stent placement. Stent placed in LAD.  No chest pain at the moment.  Renal function has been stable.   Objective:  Vital signs in last 24 hours:  Temp:  [97.5 F (36.4 C)-99.4 F (37.4 C)] 97.9 F (36.6 C) (10/10 1000) Pulse Rate:  [61-102] 68 (10/10 1015) Resp:  [13-27] 23 (10/10 1015) BP: (97-163)/(57-87) 144/74 mmHg (10/10 1000) SpO2:  [93 %-100 %] 96 % (10/10 1015) Weight:  [82.3 kg (181 lb 7 oz)] 82.3 kg (181 lb 7 oz) (10/10 0644)  Weight change:  Filed Weights   11/07/14 1803 11/09/14 0906 11/12/14 0644  Weight: 81.194 kg (179 lb) 79.878 kg (176 lb 1.6 oz) 82.3 kg (181 lb 7 oz)    Intake/Output: I/O last 3 completed shifts: In: 3242.8 [P.O.:720; I.V.:2522.8] Out: 1000 [Urine:1000]   Intake/Output this shift:     Physical Exam: General: NAD, laying in bed  Head: Normocephalic, atraumatic. Moist oral mucosal membranes  Eyes: Anicteric  Neck: Supple, trachea midline  Lungs:  Clear to auscultation normal effort  Heart: Regular rate and rhythm  Abdomen:  Soft, nontender, BS present  Extremities: no peripheral edema.  Neurologic: Nonfocal, moving all four extremities  Skin: No lesions       Basic Metabolic Panel:  Recent Labs Lab 11/07/14 1802 11/09/14 0454 11/10/14 0214 11/11/14 0228 11/12/14 0333  NA 134* 140 139 141 141  K 3.9 4.4 4.5 4.2 4.2  CL 106 111 113* 112* 111  CO2 20* '23 23 23 25  ' GLUCOSE 234* 110* 119* 124* 123*  BUN 24* 27* 26* 18 14  CREATININE 2.00* 1.76* 1.58* 1.28* 1.23*  CALCIUM 8.4* 8.8* 8.7* 8.7* 8.7*    Liver Function Tests: No results for input(s): AST, ALT, ALKPHOS, BILITOT, PROT, ALBUMIN in the last 168 hours. No results for input(s): LIPASE, AMYLASE in the last 168 hours. No results for input(s): AMMONIA in the last 168 hours.  CBC:  Recent Labs Lab 11/07/14 1802 11/10/14 0214 11/11/14 0228 11/12/14 0333  WBC 10.4 10.1 8.6 7.9  HGB 11.8* 9.9*  10.0* 9.7*  HCT 36.1 30.7* 30.9* 29.1*  MCV 78.7* 78.5* 78.6* 78.7*  PLT 293 212 195 171    Cardiac Enzymes:  Recent Labs Lab 11/07/14 1802 11/08/14 0045 11/08/14 0658  TROPONINI 0.04* 0.04* <0.03    BNP: Invalid input(s): POCBNP  CBG:  Recent Labs Lab 11/11/14 0729 11/11/14 1105 11/11/14 1546 11/11/14 1933 11/12/14 1012  GLUCAP 92 176* 188* 217* 105*    Microbiology: Results for orders placed or performed during the hospital encounter of 11/07/14  MRSA PCR Screening     Status: None   Collection Time: 11/09/14  8:00 PM  Result Value Ref Range Status   MRSA by PCR NEGATIVE NEGATIVE Final    Comment:        The GeneXpert MRSA Assay (FDA approved for NASAL specimens only), is one component of a comprehensive MRSA colonization surveillance program. It is not intended to diagnose MRSA infection nor to guide or monitor treatment for MRSA infections.     Coagulation Studies:  Recent Labs  11/12/14 0333  LABPROT 14.7  INR 1.13    Urinalysis: No results for input(s): COLORURINE, LABSPEC, PHURINE, GLUCOSEU, HGBUR, BILIRUBINUR, KETONESUR, PROTEINUR, UROBILINOGEN, NITRITE, LEUKOCYTESUR in the last 72 hours.  Invalid input(s): APPERANCEUR    Imaging: No results found.   Medications:   . sodium chloride 50 mL/hr at 11/11/14 2336  . sodium  chloride 1 mL/kg/hr (11/11/14 2336)  . sodium chloride     . aspirin EC  325 mg Oral Daily  . aspirin EC  81 mg Oral Daily  . Brexpiprazole  2 mg Oral Daily  . carvedilol  9.375 mg Oral BID  . citalopram  40 mg Oral Daily  . cloNIDine  0.1 mg Oral q morning - 10a  . clopidogrel  75 mg Oral Q breakfast  . gabapentin  300 mg Oral QHS  . gabapentin  600 mg Oral q morning - 10a  . insulin aspart  0-5 Units Subcutaneous QHS  . insulin aspart  0-9 Units Subcutaneous TID WC  . isosorbide mononitrate  30 mg Oral Daily  . levothyroxine  25 mcg Oral QAC breakfast  . nitroGLYCERIN  1 inch Topical Once  . pantoprazole   40 mg Oral BID  . rosuvastatin  10 mg Oral q1800  . sodium bicarbonate   Intravenous Pre-Cath  . sodium chloride  3 mL Intravenous Q12H  . sodium chloride  3 mL Intravenous Q12H  . temazepam  15 mg Oral QHS   sodium chloride, acetaminophen **OR** acetaminophen, acetaminophen, baclofen, loperamide, morphine injection, promethazine, sodium chloride  Assessment/ Plan:  [name] is a 44 y.o. white female with hypertension, diabetes mellitus for over 20 years, diabetic retinopathy, history of CVA, gastric ulcer, colonic polyps, hyperlipidemia, gastric bypass performed [facility], CVA with mild L sided weakness 2015, carotid artery bypass and depression   1. Chronic kidney disease stage III with proteinuria: chronic kidney disease stage III secondary to diabetic nephropathy and vascular disease. Baseline creatinine of 1.93, eGFR of 31 on 10/4. Has received IV contrast with cardiac catheterization on 10/7 and 10/10 -renal function has remained stable thus far, will continue IVF hydration post cardiac catherization.  Continue to monitor renal function trend for now.  2. Hypertension: has a history of hypotension with multiple blood pressure medications.   BP 144/74 this AM, continue coreg, clonidine, imdur.  Would like to add back an ACEi at some point.  3. Anemia of chronic kidney disease: hgb 9.7, avoiding epogen at this time given acute ischemic event.  4. Pure cholesterolemia:  On crestor now.  5. Diabetes mellitus type II Insulin dependent with chronic kidney disease: off metformin and invokana during hospitalization.  - Continue glucose control.    LOS: 3 [name] 10/10/201610:43 AM

## 2014-11-12 NOTE — Progress Notes (Signed)
Gave report to [place], Therapist, sports on 2A. Patient to be transferred to room 231. Vascular access site stable, level 1 no signs of bleeding or hematoma. Patient to notify family.

## 2014-11-12 NOTE — Progress Notes (Signed)
Pt refuses the bed alarm, pt has been educated regarding safety in the room.

## 2014-11-12 NOTE — OR Nursing (Signed)
7:15 Rt and left femoral pulses 2 plus  And PT pulses 1 plus.

## 2014-11-12 NOTE — Progress Notes (Signed)
Patient resting overnight, medicated for headache x 2 with relief. Up to [facility], steady on feet denies CP. NTG continues at 37mcg. Vital Signs stable. Patient transported by bed to Cath Lab 0705am, see [facility] for further updates. Will continue to assess for changes/need.

## 2014-11-12 NOTE — Progress Notes (Signed)
ANTICOAGULATION CONSULT NOTE - Initial Consult  Pharmacy Consult for Heparin Indication: chest pain/ACS  Allergies  Allergen Reactions  . Tramadol Other (See Comments)    Reaction:  Burning of pts mouth and confusion     Patient Measurements: Height: 5\' 3"  (160 cm) Weight: 176 lb 1.6 oz (79.878 kg) IBW/kg (Calculated) : 52.4 Heparin Dosing Weight: 69.8 kg   Vital Signs: Temp: 98.8 F (37.1 C) (10/09 1900) Temp Source: Oral (10/09 1900) BP: 111/66 mmHg (10/10 0200) Pulse Rate: 97 (10/09 1900)  Labs:  Recent Labs  11/10/14 0214  11/11/14 0228 11/11/14 0946 11/11/14 1617 11/12/14 0333  HGB 9.9*  --  10.0*  --   --  9.7*  HCT 30.7*  --  30.9*  --   --  29.1*  PLT 212  --  195  --   --  171  LABPROT  --   --   --   --   --  14.7  INR  --   --   --   --   --  1.13  HEPARINUNFRC 0.12*  < > 0.24* 0.31 0.28* 0.42  CREATININE 1.58*  --  1.28*  --   --  1.23*  < > = values in this interval not displayed.  Estimated Creatinine Clearance: 57.2 mL/min (by C-G formula based on Cr of 1.23).   Medical History: Past Medical History  Diagnosis Date  . Colon polyp   . GERD (gastroesophageal reflux disease)   . History of left heart catheterization (LHC)     a. cath 08/09/2003: mild LAD plaque, no obs dzs noted; b. 12/18/10 stress test w/o ischemia.   Marland Kitchen CVA (cerebral vascular accident) ([place])     a. 06/2008-TEE nl LV fxn; b. 01/2013 - notes indicate short run of SVT at that time  . Multiple sclerosis ([place]) 2015  . Syncope   . Chronic systolic CHF (congestive heart failure) ([place])     a. echo 01/29/2013 EF 40-45%, basal anteroseptal & mid anteroseptal segments abnl; b echo 05/2012 EF 50-55%, mildly dilated LA, nl RVSP  . Orthostatic hypotension   . Aortic arch aneurysm ([place])   . NICM (nonischemic cardiomyopathy) ([place])   . MS (mitral stenosis)   . Malignant melanoma of skin of scalp ([place])   . Hypertension   . Depression   . Diabetes mellitus without complication ([place])   .  Headache     Migrains  . Hyperlipidemia   . CKD (chronic kidney disease), stage III   . Hypothyroidism   . Anxiety     Medications:  Facility-administered medications prior to admission  Medication Dose Route Frequency Provider Last Rate Last Dose  . methylPREDNISolone sodium succinate (SOLU-MEDROL) 500 mg in sodium chloride 0.9 % 100 mL IVPB  500 mg Intravenous Continuous Marcial Pacas, MD 0 mL/hr at 06/07/12 1600 250 mg at 06/07/12 1836  . valproate (DEPACON) 500 mg in sodium chloride 0.9 % 100 mL IVPB  500 mg Intravenous Continuous Marcial Pacas, MD 105 mL/hr at 06/07/12 1450 500 mg at 06/07/12 1450   Prescriptions prior to admission  Medication Sig Dispense Refill Last Dose  . aspirin EC 81 MG tablet Take 81 mg by mouth daily.   11/07/2014 at 0600  . baclofen (LIORESAL) 10 MG tablet Take 10 mg by mouth 3 (three) times daily as needed for muscle spasms.    Past Week at Unknown time  . Brexpiprazole 2 MG TABS Take 2 mg by mouth daily. One tablet daily. 30 tablet  3 11/07/2014 at Unknown time  . canagliflozin (INVOKANA) 300 MG TABS tablet Take 300 mg by mouth daily.   11/07/2014 at Unknown time  . carvedilol (COREG) 12.5 MG tablet Take 12.5 mg by mouth 2 (two) times daily.   11/07/2014 at 0600  . citalopram (CELEXA) 40 MG tablet Take 1 tablet (40 mg total) by mouth daily. 30 tablet 3 11/07/2014 at Unknown time  . cloNIDine (CATAPRES) 0.2 MG tablet Take 0.2 mg by mouth at bedtime.   11/06/2014 at Unknown time  . clopidogrel (PLAVIX) 75 MG tablet Take 75 mg by mouth daily.   11/07/2014 at 0600  . enalapril (VASOTEC) 5 MG tablet Take 5 mg by mouth daily.   11/07/2014 at Unknown time  . gabapentin (NEURONTIN) 300 MG capsule Take 300-600 mg by mouth 2 (two) times daily. Pt takes two in the morning and one at bedtime.   11/07/2014 at Unknown time  . insulin aspart (NOVOLOG) 100 UNIT/ML injection Inject 4-15 Units into the skin 3 (three) times daily as needed for high blood sugar. Pt uses per sliding scale.   PRN  at PRN  . insulin detemir (LEVEMIR) 100 UNIT/ML injection Inject 0.25 mLs (25 Units total) into the skin as needed. (Patient taking differently: Inject 25 Units into the skin at bedtime as needed (for high blood sugar). ) 10 mL 1 Past Week at Unknown time  . levothyroxine (SYNTHROID, LEVOTHROID) 25 MCG tablet Take 25 mcg by mouth daily before breakfast.   11/07/2014 at Unknown time  . metFORMIN (GLUCOPHAGE) 1000 MG tablet Take 1,000 mg by mouth 2 (two) times daily.   11/07/2014 at Unknown time  . pantoprazole (PROTONIX) 40 MG tablet Take 40 mg by mouth 2 (two) times daily.    11/07/2014 at Unknown time  . promethazine (PHENERGAN) 25 MG tablet Take 25 mg by mouth every 6 (six) hours as needed for nausea or vomiting.   11/07/2014 at Unknown time  . temazepam (RESTORIL) 15 MG capsule Take 15 mg by mouth at bedtime.   11/06/2014 at Unknown time  . Brexpiprazole (REXULTI) 1 MG TABS Take 1 mg by mouth every morning. (Patient not taking: Reported on 11/07/2014) 30 tablet 1 Taking    Assessment: Pharmacy consulted to dose heparin in this 43 yr old female with ACS.  No prior anticoagulation noted.   Goal of Therapy:  Heparin level 0.3-0.7 units/ml Monitor platelets by anticoagulation protocol: Yes   Plan:  Heparin level is slighlty low at recheck. Will bolus 1050 units and then increase rate to 1750 units. Will recheck in 6 hours.  10/10 AM anti-Xa 0.42. Recheck in 6 hours to confirm.   Rabon Scholle S 11/12/2014,4:58 AM

## 2014-11-12 NOTE — Progress Notes (Signed)
Spoke with Dr. [name] on the phone about patient's orders. MD clarified fluids, medications, and activity. RN unable to put in activity from PCI orderset without re-doing whole orderset so had to place separately.

## 2014-11-13 ENCOUNTER — Telehealth: Payer: Self-pay

## 2014-11-13 DIAGNOSIS — Z955 Presence of coronary angioplasty implant and graft: Secondary | ICD-10-CM

## 2014-11-13 DIAGNOSIS — I25111 Atherosclerotic heart disease of native coronary artery with angina pectoris with documented spasm: Principal | ICD-10-CM

## 2014-11-13 DIAGNOSIS — E785 Hyperlipidemia, unspecified: Secondary | ICD-10-CM

## 2014-11-13 DIAGNOSIS — E118 Type 2 diabetes mellitus with unspecified complications: Secondary | ICD-10-CM

## 2014-11-13 LAB — CBC
HCT: 28.9 % — ABNORMAL LOW (ref 35.0–47.0)
Hemoglobin: 9.6 g/dL — ABNORMAL LOW (ref 12.0–16.0)
MCH: 26.3 pg (ref 26.0–34.0)
MCHC: 33.3 g/dL (ref 32.0–36.0)
MCV: 78.9 fL — ABNORMAL LOW (ref 80.0–100.0)
Platelets: 159 10*3/uL (ref 150–440)
RBC: 3.66 MIL/uL — ABNORMAL LOW (ref 3.80–5.20)
RDW: 14.6 % — ABNORMAL HIGH (ref 11.5–14.5)
WBC: 7.6 10*3/uL (ref 3.6–11.0)

## 2014-11-13 LAB — GLUCOSE, CAPILLARY: GLUCOSE-CAPILLARY: 82 mg/dL (ref 65–99)

## 2014-11-13 LAB — BASIC METABOLIC PANEL
ANION GAP: 7 (ref 5–15)
BUN: 13 mg/dL (ref 6–20)
CALCIUM: 8.8 mg/dL — AB (ref 8.9–10.3)
CHLORIDE: 109 mmol/L (ref 101–111)
CO2: 27 mmol/L (ref 22–32)
Creatinine, Ser: 1.26 mg/dL — ABNORMAL HIGH (ref 0.44–1.00)
GFR calc non Af Amer: 50 mL/min — ABNORMAL LOW (ref >60)
GFR, EST AFRICAN AMERICAN: 58 mL/min — AB (ref >60)
GLUCOSE: 107 mg/dL — AB (ref 65–99)
Potassium: 4.4 mmol/L (ref 3.5–5.1)
Sodium: 143 mmol/L (ref 135–145)

## 2014-11-13 MED ORDER — CLOPIDOGREL BISULFATE 75 MG PO TABS: 75.0000 mg | ORAL_TABLET | Freq: Every day | ORAL | Status: DC

## 2014-11-13 MED ORDER — ISOSORBIDE MONONITRATE ER 30 MG PO TB24: 30.0000 mg | ORAL_TABLET | Freq: Every day | ORAL | Status: DC

## 2014-11-13 MED ORDER — CLONIDINE HCL 0.2 MG PO TABS: 0.1000 mg | ORAL_TABLET | Freq: Every day | ORAL | Status: DC

## 2014-11-13 MED ORDER — CARVEDILOL 12.5 MG PO TABS: 12.5000 mg | ORAL_TABLET | Freq: Two times a day (BID) | ORAL | Status: DC

## 2014-11-13 MED ORDER — ROSUVASTATIN CALCIUM 20 MG PO TABS: 20.0000 mg | ORAL_TABLET | Freq: Every day | ORAL | Status: DC

## 2014-11-13 NOTE — Discharge Instructions (Signed)
°  DIET:  °Diabetic diet ° °DISCHARGE CONDITION:  °Stable ° °ACTIVITY:  °Activity as tolerated ° °OXYGEN:  °Home Oxygen: No. °  °Oxygen Delivery: room air ° °DISCHARGE LOCATION:  °home  ° °If you experience worsening of your admission symptoms, develop shortness of breath, life threatening emergency, suicidal or homicidal thoughts you must seek medical attention immediately by calling 911 or calling your MD immediately  if symptoms less severe. ° °You Must read complete instructions/literature along with all the possible adverse reactions/side effects for all the Medicines you take and that have been prescribed to you. Take any new Medicines after you have completely understood and accpet all the possible adverse reactions/side effects.  ° °Please note ° °You were cared for by a hospitalist during your hospital stay. If you have any questions about your discharge medications or the care you received while you were in the hospital after you are discharged, you can call the unit and asked to speak with the hospitalist on call if the hospitalist that took care of you is not available. Once you are discharged, your primary care physician will handle any further medical issues. Please note that NO REFILLS for any discharge medications will be authorized once you are discharged, as it is imperative that you return to your primary care physician (or establish a relationship with a primary care physician if you do not have one) for your aftercare needs so that they can reassess your need for medications and monitor your lab values. ° ° ° °

## 2014-11-13 NOTE — Progress Notes (Addendum)
Patient discharged home, IV and telemetry removed, reviewed instructions and home meds,vascular discharge instructions reviewed with patient, rx called into pharmacy, home meds returned to patient, family present at this time, no questions verbalized, escorted to visitors entrance

## 2014-11-13 NOTE — Progress Notes (Signed)
Spoke with patient over the phone about diabetes and home regimen for diabetes control. Patient reports that she is currently she taking Invokana 300 mg daily and Metformin 1000 mg BID as an outpatient for diabetes control.  Patient has Levemir 25 units QHS as needed and Novolog 4-12 units TID with meals as needed on home medication list. Patient reports that she does have Levemir and Novolog at home to use if needed but she has not had to use the Levemir in over 1 month and has not used the Novolog in over 3 months. Patient states that her glucose has been much better since she was started on Invokana. Patient reports that her last A1C was 6.2% at her last doctors visit. Patient reports that she has lost weight and had a decreased appetite since starting on the Invokana.  Patient states that she usually eats one good meal per day and has several snacks throughout the day. Asked patient to be sure her doctor knows that she is not requiring the Levemir and Novolog. Encouraged patient to continue to work with her doctor to maintain glycemic control. Patient verbalized understanding of information discussed and she states that she has no further questions at this time related to diabetes.   In reviewing the chart, noted glucose ranged from 105-238 mg/dl on 11/12/14 and fasting glucose is 82 mg/dl this morning. Added carb mod to heart healthy diet. Will continue to follow as inpatient.  Thanks, Barnie Alderman, RN, MSN, CCRN, CDE Diabetes Coordinator Inpatient Diabetes Program [phone] (Team Pager) [phone] (AP office) [phone] [facility] office) [phone] [facility] office)

## 2014-11-13 NOTE — Progress Notes (Signed)
Patient: [name] [name] / Admit Date: 11/07/2014 / Date of Encounter: 11/13/2014, 8:04 AM   Subjective: Feels well, no complaints,  Wants to go home, no chest pain lipitor causes leg pain, changed to crestor  Review of Systems: Review of Systems  Constitutional: Negative.   Respiratory: Negative.   Cardiovascular: Negative.   Gastrointestinal: Negative.   Musculoskeletal: Negative.   Neurological: Negative.   Psychiatric/Behavioral: Negative.   All other systems reviewed and are negative.    Objective: Telemetry: NSR Physical Exam: Blood pressure 131/70, pulse 64, temperature 98.3 F (36.8 C), temperature source Oral, resp. rate 16, height 5\' 3"  (1.6 m), weight 185 lb 13.6 oz (84.3 kg), SpO2 96 %. Body mass index is 32.93 kg/(m^2). General: Well developed, well nourished, in no acute distress. Head: Normocephalic, atraumatic, sclera non-icteric, no xanthomas, nares are without discharge. Neck: Negative for carotid bruits. JVP not elevated. Lungs: Clear bilaterally to auscultation without wheezes, rales, or rhonchi. Breathing is unlabored. Heart: RRR S1 S2 without murmurs, rubs, or gallops.  Abdomen: Soft, non-tender, non-distended with normoactive bowel sounds. No rebound/guarding. Extremities: No clubbing or cyanosis. No edema. Distal pedal pulses are 2+ and equal bilaterally. Neuro: Alert and oriented X 3. Moves all extremities spontaneously. Psych:  Responds to questions appropriately with a normal affect.   Intake/Output Summary (Last 24 hours) at 11/13/14 0804 Last data filed at 11/13/14 0701  Gross per 24 hour  Intake 2895.33 ml  Output   2450 ml  Net 445.33 ml    Inpatient Medications:  . aspirin EC  325 mg Oral Daily  . Brexpiprazole  2 mg Oral Daily  . carvedilol  9.375 mg Oral BID  . citalopram  40 mg Oral Daily  . cloNIDine  0.1 mg Oral q morning - 10a  . clopidogrel  75 mg Oral Q breakfast  . gabapentin  300 mg Oral QHS  . gabapentin  600 mg  Oral q morning - 10a  . insulin aspart  0-5 Units Subcutaneous QHS  . insulin aspart  0-9 Units Subcutaneous TID WC  . isosorbide mononitrate  30 mg Oral Daily  . levothyroxine  25 mcg Oral QAC breakfast  . pantoprazole  40 mg Oral BID  . rosuvastatin  20 mg Oral q1800  . sodium chloride  3 mL Intravenous Q12H  . temazepam  15 mg Oral QHS   Infusions:    Labs:  Recent Labs  11/12/14 0333 11/13/14 0355  NA 141 143  K 4.2 4.4  CL 111 109  CO2 25 27  GLUCOSE 123* 107*  BUN 14 13  CREATININE 1.23* 1.26*  CALCIUM 8.7* 8.8*   No results for input(s): AST, ALT, ALKPHOS, BILITOT, PROT, ALBUMIN in the last 72 hours.  Recent Labs  11/12/14 0333 11/12/14 1349 11/13/14 0355  WBC 7.9  --  7.6  HGB 9.7*  --  9.6*  HCT 29.1*  --  28.9*  MCV 78.7*  --  78.9*  PLT 171 157 159   No results for input(s): CKTOTAL, CKMB, TROPONINI in the last 72 hours. Invalid input(s): POCBNP No results for input(s): HGBA1C in the last 72 hours.   Weights: Filed Weights   11/09/14 [id number] 11/12/14 0644 11/12/14 1357  Weight: 176 lb 1.6 oz (79.878 kg) 181 lb 7 oz (82.3 kg) 185 lb 13.6 oz (84.3 kg)     Radiology/Studies:  Dg Chest 2 View  11/07/2014   CLINICAL DATA:  Chest pain since yesterday  EXAM: CHEST  2  VIEW  COMPARISON:  06/03/2014  FINDINGS: Normal heart size. Clear lungs. Azygos lobe is noted. Low volumes. No pneumothorax. Postoperative changes in the left thoracic inlet.  IMPRESSION: No active cardiopulmonary disease.   Electronically Signed   By: Marybelle Killings M.D.   On: 11/07/2014 19:00   Nm Myocar Multi W/spect W/wall Motion / Ef  11/08/2014    There was no ST segment deviation noted during stress.  This is a low risk study.  The left ventricular ejection fraction is mildly decreased (45-54%).   The study is overall suboptimal due to GI uptake. No convincing evidence  of ischemia or infarcts.     Assessment and Plan   1. Elevated troponin with unstable angina -Mildly elevated at  0.04-->0.04-->0.03,  nuclear stress test which was overall suboptimal due to GI uptake but did not show convincing evidence of ischemia. ---Cardiac catheterization performed for continued chest pain Severe mid LAD lesion estimated at 85-90%, long, tubular stenosis Creatinine  2.0,  1.79 day of cath PCI delayed secondary to renal failure Performed yesterday, successful PCI of the LAD -On aspirin, Plavix, crestor, Coreg and Imdur.  2.Essential hypertension : Stable on current meds  3. History of previous strokes: -Continue aspirin and Plavix  4. CKD stage IV: Creatinine improved.  Seen by Dr. Holley Raring  5. History of left subclavian artery bypass: Crestor, asa  6. DM2: -Per IM  7. HLD: -crestor 20 daily  Signed, Esmond Plants, MD [facility] 11/13/2014, 8:04 AM

## 2014-11-13 NOTE — Telephone Encounter (Signed)
Patient contacted regarding discharge from [facility] on 11/13/14.  Patient understands to follow up with Christell Faith, PA on 11/23/14 at 2:00 at [facility] Patient understands discharge instructions? yes Patient understands medications and regiment? yes Patient understands to bring all medications to this visit? yes

## 2014-11-13 NOTE — Progress Notes (Signed)
[facility]  ROUNDING NOTE   Subjective:  Doing well post cath. No chest pain this AM. Renal function stable. Good UOP noted.  Counselled pt on medication adherence.   Objective:  Vital signs in last 24 hours:  Temp:  [98.3 F (36.8 C)-98.8 F (37.1 C)] 98.3 F (36.8 C) (10/11 0406) Pulse Rate:  [64-71] 64 (10/11 0406) Resp:  [16-23] 16 (10/11 0406) BP: (107-166)/(57-75) 131/70 mmHg (10/11 0406) SpO2:  [96 %-98 %] 96 % (10/11 0406) Weight:  [84.3 kg (185 lb 13.6 oz)] 84.3 kg (185 lb 13.6 oz) (10/10 1357)  Weight change: 2 kg (4 lb 6.5 oz) Filed Weights   11/09/14 0906 11/12/14 0644 11/12/14 1357  Weight: 79.878 kg (176 lb 1.6 oz) 82.3 kg (181 lb 7 oz) 84.3 kg (185 lb 13.6 oz)    Intake/Output: I/O last 3 completed shifts: In: 3175.8 [P.O.:360; I.V.:2815.8] Out: 2450 [Urine:2450]   Intake/Output this shift:  Total I/O In: 816.7 [P.O.:240; I.V.:576.7] Out: -   Physical Exam: General: NAD, laying in bed  Head: Normocephalic, atraumatic. Moist oral mucosal membranes  Eyes: Anicteric  Neck: Supple, trachea midline  Lungs:  Clear to auscultation normal effort  Heart: Regular rate and rhythm  Abdomen:  Soft, nontender, BS present  Extremities: no peripheral edema.  Neurologic: Nonfocal, moving all four extremities  Skin: No lesions       Basic Metabolic Panel:  Recent Labs Lab 11/09/14 0454 11/10/14 0214 11/11/14 0228 11/12/14 0333 11/13/14 0355  NA 140 139 141 141 143  K 4.4 4.5 4.2 4.2 4.4  CL 111 113* 112* 111 109  CO2 '23 23 23 25 27  ' GLUCOSE 110* 119* 124* 123* 107*  BUN 27* 26* '18 14 13  ' CREATININE 1.76* 1.58* 1.28* 1.23* 1.26*  CALCIUM 8.8* 8.7* 8.7* 8.7* 8.8*    Liver Function Tests: No results for input(s): AST, ALT, ALKPHOS, BILITOT, PROT, ALBUMIN in the last 168 hours. No results for input(s): LIPASE, AMYLASE in the last 168 hours. No results for input(s): AMMONIA in the last 168 hours.  CBC:  Recent Labs Lab  11/07/14 1802 11/10/14 0214 11/11/14 0228 11/12/14 0333 11/12/14 1349 11/13/14 0355  WBC 10.4 10.1 8.6 7.9  --  7.6  HGB 11.8* 9.9* 10.0* 9.7*  --  9.6*  HCT 36.1 30.7* 30.9* 29.1*  --  28.9*  MCV 78.7* 78.5* 78.6* 78.7*  --  78.9*  PLT 293 212 195 171 157 159    Cardiac Enzymes:  Recent Labs Lab 11/07/14 1802 11/08/14 0045 11/08/14 0658  TROPONINI 0.04* 0.04* <0.03    BNP: Invalid input(s): POCBNP  CBG:  Recent Labs Lab 11/12/14 1012 11/12/14 1243 11/12/14 1733 11/12/14 1926 11/13/14 0729  GLUCAP 105* 188* 68* 56* 34    Microbiology: Results for orders placed or performed during the hospital encounter of 11/07/14  MRSA PCR Screening     Status: None   Collection Time: 11/09/14  8:00 PM  Result Value Ref Range Status   MRSA by PCR NEGATIVE NEGATIVE Final    Comment:        The GeneXpert MRSA Assay (FDA approved for NASAL specimens only), is one component of a comprehensive MRSA colonization surveillance program. It is not intended to diagnose MRSA infection nor to guide or monitor treatment for MRSA infections.     Coagulation Studies:  Recent Labs  11/12/14 0333  LABPROT 14.7  INR 1.13    Urinalysis: No results for input(s): COLORURINE, LABSPEC, [place], GLUCOSEU, [place], BILIRUBINUR, KETONESUR, PROTEINUR,  UROBILINOGEN, NITRITE, LEUKOCYTESUR in the last 72 hours.  Invalid input(s): APPERANCEUR    Imaging: No results found.   Medications:     . aspirin EC  325 mg Oral Daily  . Brexpiprazole  2 mg Oral Daily  . carvedilol  9.375 mg Oral BID  . citalopram  40 mg Oral Daily  . cloNIDine  0.1 mg Oral q morning - 10a  . clopidogrel  75 mg Oral Q breakfast  . gabapentin  300 mg Oral QHS  . gabapentin  600 mg Oral q morning - 10a  . insulin aspart  0-5 Units Subcutaneous QHS  . insulin aspart  0-9 Units Subcutaneous TID WC  . isosorbide mononitrate  30 mg Oral Daily  . levothyroxine  25 mcg Oral QAC breakfast  . pantoprazole  40 mg  Oral BID  . rosuvastatin  20 mg Oral q1800  . sodium chloride  3 mL Intravenous Q12H  . temazepam  15 mg Oral QHS   acetaminophen **OR** acetaminophen, acetaminophen, baclofen, loperamide, morphine injection, promethazine  Assessment/ Plan:  [name] is a 44 y.o. white female with hypertension, diabetes mellitus for over 20 years, diabetic retinopathy, history of CVA, gastric ulcer, colonic polyps, hyperlipidemia, gastric bypass performed [facility], CVA with mild L sided weakness 2015, carotid artery bypass and depression   1. Chronic kidney disease stage III with proteinuria: chronic kidney disease stage III secondary to diabetic nephropathy and vascular disease. Baseline creatinine of 1.93, eGFR of 31 on 10/4. Has received IV contrast with cardiac catheterization on 10/7 and 10/10 -Kidney function remains stable, has received post procedure hydration, ok to d/c IVFs.   Will continue to follow renal function as outpt.  2. Hypertension: has a history of hypotension with multiple blood pressure medications.   BP 131/70, continue current antihypertensives.  3. Anemia of chronic kidney disease: hgb 9.6, will continue to monitor hgb as outpt.  4. Pure cholesterolemia:  Continue crestor.   5. Diabetes mellitus type II Insulin dependent with chronic kidney disease: off metformin and invokana during hospitalization.  - Continue glucose control.    LOS: 4 [name] 10/11/201610:14 AM

## 2014-11-13 NOTE — Telephone Encounter (Signed)
-----   Message from Blain Pais sent at 11/13/2014 10:51 AM EDT ----- Regarding: tcm/ph 11/23/2014 2:00 Christell Faith, PA-C

## 2014-11-14 ENCOUNTER — Encounter: Payer: Self-pay | Admitting: Cardiovascular Disease

## 2014-11-19 NOTE — Discharge Summary (Signed)
[facility] NAME: [name]    MR#:  [id number]  DATE OF BIRTH:  1970/12/04  DATE OF ADMISSION:  11/07/2014 ADMITTING PHYSICIAN: Henreitta Leber, MD  DATE OF DISCHARGE: 11/13/2014 11:49 AM  PRIMARY CARE PHYSICIAN: [name], MD    ADMISSION DIAGNOSIS:  Ischemic chest pain ([place]) [I20.9]  DISCHARGE DIAGNOSIS:  Active Problems:   Chest pain   Ischemic chest pain (HCC)   Angina pectoris ([place])   Coronary artery disease involving native coronary artery of native heart with angina pectoris with documented spasm (HCC)   S/P coronary artery stent placement   Type 2 diabetes mellitus with complication, without long-term current use of insulin ([place])   SECONDARY DIAGNOSIS:   Past Medical History  Diagnosis Date  . Colon polyp   . GERD (gastroesophageal reflux disease)   . History of left heart catheterization (LHC)     a. cath 08/09/2003: mild LAD plaque, no obs dzs noted; b. 12/18/10 stress test w/o ischemia.   Marland Kitchen CVA (cerebral vascular accident) ([place])     a. 06/2008-TEE nl LV fxn; b. 01/2013 - notes indicate short run of SVT at that time  . Multiple sclerosis ([place]) 2015  . Syncope   . Chronic systolic CHF (congestive heart failure) ([place])     a. echo 01/29/2013 EF 40-45%, basal anteroseptal & mid anteroseptal segments abnl; b echo 05/2012 EF 50-55%, mildly dilated LA, nl RVSP  . Orthostatic hypotension   . Aortic arch aneurysm ([place])   . NICM (nonischemic cardiomyopathy) ([place])   . MS (mitral stenosis)   . Malignant melanoma of skin of scalp ([place])   . Hypertension   . Depression   . Diabetes mellitus without complication ([place])   . Headache     Migrains  . Hyperlipidemia   . CKD (chronic kidney disease), stage III   . Hypothyroidism   . Anxiety      ADMITTING HISTORY  Kendra [address] [name] [name] is a 44 y.o. female with a known history of coronary disease, history of previous CVA, GERD, anxiety, history of multiple  sclerosis, history of mitral stenosis, depression, type 2 diabetes without complication insulin-dependent, CKD Stage III, who presents to the hospital due to chest pain that began yesterday. Patient says she developed substernal chest pressure while she was driving yesterday but resolved by the time she got home. She said it lasted anywhere between a few minutes and then she had a recurrence of her chest pain while she was preparing dinner yesterday evening. Patient took some nitroglycerin and the pain shortly thereafter resolved. She then broke up in the middle night last night having some substernal chest pressure radiating to her left arm. She took some more nitroglycerin which seemed to alleviate her pain and she was a fall back asleep. She called her cardiologist this morning who recommended that she come to the ER for further evaluation. Patient says that she still has some mild substernal chest pain but it's much improved since yesterday. She also admits to shortness of breath, diaphoresis, nausea with the symptoms as mentioned above. Given the recurrence of her symptoms hospitalist services were contacted for further treatment and evaluation.   HOSPITAL COURSE:   44 year old female with past medical history of coronary disease, multiple sclerosis, hypertension, hyperlipidemia, anxiety, chronic kidney disease stage III, 2 diabetes without complication, who presents to the hospital due to chest pain with mildly elevated troponin.  * Unstable angina-patient does have risk factors  given her history of diabetes, history of coronary artery disease.  Stress test neg. Due to her persistent symptoms - Cardiac catheterization performed. Showed - Severe mid LAD lesion estimated at 85-90%, long, tubular stenosis. On aspirin, Plavix, atorvastatin Coreg and Imdur. Heparin started in hospital due to on going chest pain. PCI to LAD with DES done. Stale at time of discharge with no chest pain. Ambulated without  any issues. Referred to cardiac rehab by cardiology.  * Hypothyroidism-continue Synthroid.  * Type 2 diabetes without complication-continue NovoLog sliding scale. Invokana and metformin stopped initially due to elevated CR and contrast. Restart at discharge as cr is much improved.  * Diabetic neuropathy-continue Neurontin.  * GERD-continue Protonix.  * Depression-continue Celexa/Restoril  * CKD 3 Place on IVF. Cr stable inspite of contrast load with cath,   CONSULTS OBTAINED:  Treatment Team:  Wellington Hampshire, MD  DRUG ALLERGIES:   Allergies  Allergen Reactions  . Tramadol Other (See Comments)    Reaction:  Burning of pts mouth and confusion     DISCHARGE MEDICATIONS:   Discharge Medication List as of 11/13/2014 10:57 AM    START taking these medications   Details  isosorbide mononitrate (IMDUR) 30 MG 24 hr tablet Take 1 tablet (30 mg total) by mouth daily., Starting 11/13/2014, Until Discontinued, Normal    rosuvastatin (CRESTOR) 20 MG tablet Take 1 tablet (20 mg total) by mouth daily at 6 PM., Starting 11/13/2014, Until Discontinued, Normal      CONTINUE these medications which have CHANGED   Details  carvedilol (COREG) 12.5 MG tablet Take 1 tablet (12.5 mg total) by mouth 2 (two) times daily., Starting 11/13/2014, Until Discontinued, Normal    cloNIDine (CATAPRES) 0.2 MG tablet Take 0.5 tablets (0.1 mg total) by mouth at bedtime., Starting 11/13/2014, Until Discontinued, No Print    clopidogrel (PLAVIX) 75 MG tablet Take 1 tablet (75 mg total) by mouth daily., Starting 11/13/2014, Until Discontinued, Normal      CONTINUE these medications which have NOT CHANGED   Details  aspirin EC 81 MG tablet Take 81 mg by mouth daily., Until Discontinued, Historical Med    baclofen (LIORESAL) 10 MG tablet Take 10 mg by mouth 3 (three) times daily as needed for muscle spasms. , Until Discontinued, Historical Med    !! Brexpiprazole 2 MG TABS Take 2 mg by mouth daily. One  tablet daily., Starting 10/02/2014, Until Discontinued, Normal    canagliflozin (INVOKANA) 300 MG TABS tablet Take 300 mg by mouth daily., Until Discontinued, Historical Med    citalopram (CELEXA) 40 MG tablet Take 1 tablet (40 mg total) by mouth daily., Starting 10/02/2014, Until Discontinued, Normal    enalapril (VASOTEC) 5 MG tablet Take 5 mg by mouth daily., Until Discontinued, Historical Med    gabapentin (NEURONTIN) 300 MG capsule Take 300-600 mg by mouth 2 (two) times daily. Pt takes two in the morning and one at bedtime., Until Discontinued, Historical Med    insulin aspart (NOVOLOG) 100 UNIT/ML injection Inject 4-15 Units into the skin 3 (three) times daily as needed for high blood sugar. Pt uses per sliding scale., Until Discontinued, Historical Med    insulin detemir (LEVEMIR) 100 UNIT/ML injection Inject 0.25 mLs (25 Units total) into the skin as needed., Starting 09/14/2014, Until Discontinued, Normal    levothyroxine (SYNTHROID, LEVOTHROID) 25 MCG tablet Take 25 mcg by mouth daily before breakfast., Until Discontinued, Historical Med    metFORMIN (GLUCOPHAGE) 1000 MG tablet Take 1,000 mg by mouth 2 (two)  times daily., Until Discontinued, Historical Med    pantoprazole (PROTONIX) 40 MG tablet Take 40 mg by mouth 2 (two) times daily. , Until Discontinued, Historical Med    promethazine (PHENERGAN) 25 MG tablet Take 25 mg by mouth every 6 (six) hours as needed for nausea or vomiting., Until Discontinued, Historical Med    temazepam (RESTORIL) 15 MG capsule Take 15 mg by mouth at bedtime., Until Discontinued, Historical Med    !! Brexpiprazole (REXULTI) 1 MG TABS Take 1 mg by mouth every morning., Starting 08/31/2014, Until Discontinued, Print     !! - Potential duplicate medications found. Please discuss with provider.       Today    VITAL SIGNS:  Blood pressure 131/70, pulse 64, temperature 98.3 F (36.8 C), temperature source Oral, resp. rate 16, height 5\' 3"  (1.6 m),  weight 84.3 kg (185 lb 13.6 oz), SpO2 96 %.  I/O:  No intake or output data in the 24 hours ending 11/19/14 0856  PHYSICAL EXAMINATION:  Physical Exam  GENERAL:  44 y.o.-year-old patient lying in the bed with no acute distress.  LUNGS: Normal breath sounds bilaterally, no wheezing, rales,rhonchi or crepitation. No use of accessory muscles of respiration.  CARDIOVASCULAR: S1, S2 normal. No murmurs, rubs, or gallops.  ABDOMEN: Soft, non-tender, non-distended. Bowel sounds present. No organomegaly or mass.  NEUROLOGIC: Moves all 4 extremities. PSYCHIATRIC: The patient is alert and oriented x 3.  SKIN: No obvious rash, lesion, or ulcer.   DATA REVIEW:   CBC  Recent Labs Lab 11/13/14 0355  WBC 7.6  HGB 9.6*  HCT 28.9*  PLT 159    Chemistries   Recent Labs Lab 11/13/14 0355  NA 143  K 4.4  CL 109  CO2 27  GLUCOSE 107*  BUN 13  CREATININE 1.26*  CALCIUM 8.8*    Cardiac Enzymes No results for input(s): TROPONINI in the last 168 hours.  Microbiology Results  Results for orders placed or performed during the hospital encounter of 11/07/14  MRSA PCR Screening     Status: None   Collection Time: 11/09/14  8:00 PM  Result Value Ref Range Status   MRSA by PCR NEGATIVE NEGATIVE Final    Comment:        The GeneXpert MRSA Assay (FDA approved for NASAL specimens only), is one component of a comprehensive MRSA colonization surveillance program. It is not intended to diagnose MRSA infection nor to guide or monitor treatment for MRSA infections.     RADIOLOGY:  No results found.    Follow up with PCP in 1 week.  Management plans discussed with the patient, family and they are in agreement.  CODE STATUS:   TOTAL TIME TAKING CARE OF THIS PATIENT ON DAY OF DISCHARGE: more than 30 minutes.    Hillary Bow R M.D on 11/19/2014 at 8:56 AM  Between 7am to 6pm - Pager - [phone]  After 6pm go to [email or web address] - password [email or web address]  Office  [phone]  CC: Primary care physician; [name], MD     Note: This dictation was prepared with Dragon dictation along with smaller phrase technology. Any transcriptional errors that result from this process are unintentional.

## 2014-11-20 DIAGNOSIS — D126 Benign neoplasm of colon, unspecified: Secondary | ICD-10-CM | POA: Insufficient documentation

## 2014-11-21 ENCOUNTER — Ambulatory Visit (INDEPENDENT_AMBULATORY_CARE_PROVIDER_SITE_OTHER): Payer: BC Managed Care – PPO | Admitting: Family Medicine

## 2014-11-21 DIAGNOSIS — I25111 Atherosclerotic heart disease of native coronary artery with angina pectoris with documented spasm: Secondary | ICD-10-CM | POA: Diagnosis not present

## 2014-11-21 DIAGNOSIS — G629 Polyneuropathy, unspecified: Secondary | ICD-10-CM | POA: Diagnosis not present

## 2014-11-21 DIAGNOSIS — Z794 Long term (current) use of insulin: Secondary | ICD-10-CM

## 2014-11-21 DIAGNOSIS — E1140 Type 2 diabetes mellitus with diabetic neuropathy, unspecified: Secondary | ICD-10-CM | POA: Diagnosis not present

## 2014-11-21 MED ORDER — PREGABALIN 75 MG PO CAPS: 75.0000 mg | ORAL_CAPSULE | Freq: Two times a day (BID) | ORAL | Status: DC

## 2014-11-21 NOTE — Progress Notes (Signed)
Patient: [name] Female    DOB: [birth year]   44 y.o.   MRN: [id number] Visit Date: 11/21/2014  Today's Provider: [name], MD   Chief Complaint  Patient presents with  . Hospitalization Follow-up   Subjective:    HPI   Follow up Hospitalization  Patient was admitted to [facility] on 11/07/2014 and discharged on 11/13/2014. She was treated for chest pain.  Cardiac catheterization performed.  Showed -  Severe mid LAD lesion estimated at  85-90%, long, tubular stenosis. On aspirin, Plavix, atorvastatin Coreg and Imdur.  Heparin started in hospital due to on going chest pain.  PCI to LAD with DES done.  Stale at time of discharge with no chest pain. Ambulated without any issues. Referred to cardiac rehab by cardiology. Upon discharge chest pain resolved and referred to cardio and rehab. She reports good compliance with treatment. She reports this condition is Improved.  She states she has had no chest pain since discharge and feels much better with improvement in her energy level, breathing and generally sense of well being  However she does state that she has having worsening of burning and stinging in her feet, despite taking gabepentin regularly.   ----------------------------------------------------------------------   Allergies  Allergen Reactions  . Tramadol Other (See Comments)    Reaction:  Burning of pts mouth and confusion    Previous Medications   ASPIRIN EC 81 MG TABLET    Take 81 mg by mouth daily.   BACLOFEN (LIORESAL) 10 MG TABLET    Take 10 mg by mouth 3 (three) times daily as needed for muscle spasms.    CANAGLIFLOZIN (INVOKANA) 300 MG TABS TABLET    Take 300 mg by mouth daily.   CARVEDILOL (COREG) 12.5 MG TABLET    Take 1 tablet (12.5 mg total) by mouth 2 (two) times daily.   CITALOPRAM (CELEXA) 40 MG TABLET    Take 1 tablet (40 mg total) by mouth daily.   CLOPIDOGREL (PLAVIX) 75 MG TABLET    Take 1 tablet (75 mg total) by mouth daily.   GABAPENTIN (NEURONTIN) 300 MG CAPSULE    Take 300-600 mg by mouth 2 (two) times daily. Pt takes two in the morning and one at bedtime.   INSULIN ASPART (NOVOLOG) 100 UNIT/ML INJECTION    Inject 4-15 Units into the skin 3 (three) times daily as needed for high blood sugar. Pt uses per sliding scale.   INSULIN DETEMIR (LEVEMIR) 100 UNIT/ML INJECTION    Inject 0.25 mLs (25 Units total) into the skin as needed.   ISOSORBIDE MONONITRATE (IMDUR) 30 MG 24 HR TABLET    Take 1 tablet (30 mg total) by mouth daily.   LEVOTHYROXINE (SYNTHROID, LEVOTHROID) 25 MCG TABLET    Take 25 mcg by mouth daily before breakfast.   METFORMIN (GLUCOPHAGE) 1000 MG TABLET    Take 1,000 mg by mouth 2 (two) times daily.   PANTOPRAZOLE (PROTONIX) 40 MG TABLET    Take 40 mg by mouth 2 (two) times daily.    PROMETHAZINE (PHENERGAN) 25 MG TABLET    Take 25 mg by mouth every 6 (six) hours as needed for nausea or vomiting.   ROSUVASTATIN (CRESTOR) 20 MG TABLET    Take 1 tablet (20 mg total) by mouth daily at 6 PM.   TEMAZEPAM (RESTORIL) 15 MG CAPSULE    Take 15 mg by mouth at bedtime.    Review of Systems  Cardiovascular: Negative for chest pain and  palpitations.  Musculoskeletal: Positive for joint swelling and arthralgias.       Pain in bilateral hands and feet, also pain in left elbow  Neurological: Negative for dizziness and light-headedness.    Social History  Substance Use Topics  . Smoking status: Former Smoker    Types: Cigarettes    Quit date: 08/31/1994  . Smokeless tobacco: Never Used     Comment: quit 28 years ago  . Alcohol Use: No   Objective:   LMP  (Approximate)  Physical Exam   General Appearance:    Alert, cooperative, no distress  Eyes:    PERRL, conjunctiva/corneas clear, EOM's intact       Lungs:     Clear to auscultation bilaterally, respirations unlabored  Heart:    Regular rate and rhythm  Neurologic:   Awake, alert, oriented x 3. No apparent focal neurological           defect.            Assessment & Plan:     1. Neuropathy ([place]) No longer controlled with gabepentin. Will change to samples Lyrica 75mg  twice a day initially.   2. Coronary artery disease involving native coronary artery of native heart with angina pectoris with documented spasm (HCC) Asymptomatic. Compliant with medication.  Continue aggressive risk factor modification.  Follow up cardiology this week as scheduled.   3. Type 2 diabetes mellitus with diabetic neuropathy, with long-term current use of insulin ([place]) Follow up to check A1c in about 2 months.        [name], MD  [facility]

## 2014-11-23 ENCOUNTER — Ambulatory Visit (INDEPENDENT_AMBULATORY_CARE_PROVIDER_SITE_OTHER): Payer: BC Managed Care – PPO | Admitting: Physician Assistant

## 2014-11-23 ENCOUNTER — Other Ambulatory Visit: Payer: Self-pay

## 2014-11-23 ENCOUNTER — Encounter: Payer: Self-pay | Admitting: Family Medicine

## 2014-11-23 ENCOUNTER — Encounter: Payer: Self-pay | Admitting: Physician Assistant

## 2014-11-23 ENCOUNTER — Ambulatory Visit (INDEPENDENT_AMBULATORY_CARE_PROVIDER_SITE_OTHER): Payer: BC Managed Care – PPO

## 2014-11-23 VITALS — BP 140/90 | HR 105 | Ht 63.0 in | Wt 182.8 lb

## 2014-11-23 DIAGNOSIS — G8194 Hemiplegia, unspecified affecting left nondominant side: Secondary | ICD-10-CM | POA: Insufficient documentation

## 2014-11-23 DIAGNOSIS — A048 Other specified bacterial intestinal infections: Secondary | ICD-10-CM

## 2014-11-23 DIAGNOSIS — I255 Ischemic cardiomyopathy: Secondary | ICD-10-CM | POA: Insufficient documentation

## 2014-11-23 DIAGNOSIS — I2510 Atherosclerotic heart disease of native coronary artery without angina pectoris: Secondary | ICD-10-CM | POA: Diagnosis not present

## 2014-11-23 DIAGNOSIS — N184 Chronic kidney disease, stage 4 (severe): Secondary | ICD-10-CM

## 2014-11-23 DIAGNOSIS — R002 Palpitations: Secondary | ICD-10-CM | POA: Diagnosis not present

## 2014-11-23 DIAGNOSIS — I5022 Chronic systolic (congestive) heart failure: Secondary | ICD-10-CM | POA: Diagnosis not present

## 2014-11-23 DIAGNOSIS — I209 Angina pectoris, unspecified: Secondary | ICD-10-CM | POA: Diagnosis not present

## 2014-11-23 DIAGNOSIS — Z955 Presence of coronary angioplasty implant and graft: Secondary | ICD-10-CM

## 2014-11-23 DIAGNOSIS — R0602 Shortness of breath: Secondary | ICD-10-CM

## 2014-11-23 HISTORY — DX: Other specified bacterial intestinal infections: A04.8

## 2014-11-23 MED ORDER — ISOSORBIDE MONONITRATE ER 30 MG PO TB24: 30.0000 mg | ORAL_TABLET | Freq: Every day | ORAL | Status: DC

## 2014-11-23 MED ORDER — CLOPIDOGREL BISULFATE 75 MG PO TABS: 75.0000 mg | ORAL_TABLET | Freq: Every day | ORAL | Status: DC

## 2014-11-23 MED ORDER — CARVEDILOL 25 MG PO TABS: 25.0000 mg | ORAL_TABLET | Freq: Two times a day (BID) | ORAL | Status: DC

## 2014-11-23 NOTE — Progress Notes (Signed)
Cardiology Hospital Follow Up Note:   Date of Encounter: 11/23/2014  ID: [id number] DOB [birth year], MRN [id number]  PCP: [name], MD Primary Cardiologist: Dr. [name], MD  Chief Complaint  Patient presents with  . other    F/u hospital status post cardiac cath. c/o heart flutter lasted about 5-6 minutes and sob with lite excertion. Meds reviewed verbally with pt.     HPI:  44 year old female with history of previously nonobstructive CAD by cardiac cath on August 09, 2003, numerous prior strokes, chronic systolic CHF, aortic arch aneurysm, aberrant left subclavian artery s/p bypass surgery, s/p gastric bypass surgery, CKD stage III, multiple sclerosis, orthostatic hypotension, HTN, HLD, syncope, hypothyroidism, migraine disorder, anxiety, and depression who presents for hospital follow up after recent admission to [facility] from 10/5 to 10/11 with unstable angina and was found to have severe mid LAD lesion at 85-90% stenosis on diagnositc cardiac cath s/p PCI/DES on staged procedure.   She underwent ischemic evaluation in 2012 via nuclear stress testing that showed no ischemia in the setting of chest pain. Since then she had done well from a chest pain stand point. She underwent CT in 11/2013 showing right aortic arch with a large retro-esophageal vascular segment with persistent dorsal aortic segment giving off an aberrant left subclavian artery. The large caliber retroaortic segment measured 18 mm compared to the ascending aorta measured 22 mm. This large retroesophageal aortic segment along with the partially calcified atretic ductus ligament between the left aberrant subclavian artery and the left pulmonary artery created an almost complete vascular ring. This was felt to be the likelycause ofpatient's dysphagia. At her last follow up in 03/2014 she was doing well. She continued to have labile blood sugars, though glucose tabs were helping.    She emailed the office on 10/5 stating  she had developed chest pain. Pain was worse with deep inspiration and was reproducible to palpation. Pain had been stuttering in nature since the evening of 10/4 when she was riding in the car with her mother. She initially took a SL NTG from her husband with good results. Pain returned while she was cooking dinner and cleaning that night and lasted several minutes and self resolved. Pain then returned while sleeping x 2.    She was found to have troponin 0.04-->0.04-->0.03. ECG with nonspecific st/t changes. CXR negative. SCr 2.00, K+ 3.9, WBC 10.4, hgb 11.8. Nitro paste was applied. Her BP has remained soft in the [id number] systolic. She has received a 500 mL NS bolus and GI cocktail. She continued to note intermittent chest pain. She underwent Lexiscan Myoview on 10/6 that was low risk, EF mildly decreased at 45-54%. She continued to note chest pain, thus underwent diagnostic cardiac cath on 10/7 that showed LM no significant disease, mid LAD 85-90%, mild distal disease, prox LCx 30%, RCA with no significant disease, LV gram not performed given her CKD. Given her CKD (SCr 2.0 prior day and 1.79 on day of cath, it was recommended she undergo a staged procedure). She underwent successful PCI/DES to the mid LAD on 11/12/2014 with a 3.25 x 23 mm Xience Alpine stent. Post cardiac cath SCr 1.26. She was continued on aspirin and Plavix (previously on 2/2 history of strokes).   She has done well for the most part since her discharge, though has noted several episodes of palpitations, one occuring while cooking dinner, the others while walking around the house. No associated symptoms with the palpitations. Palpitations last approximately  5 minutes then self resolve. She has also noted an episode of SOB while walking to the mailbox on 10/20. No further chest pain. Her weight has been stable, no lower extremity edema, orthopnea, PND, or early satiety. She is tolerating her medications without issues.     Past Medical  History  Diagnosis Date  . Colon polyp   . GERD (gastroesophageal reflux disease)   . CAD (coronary artery disease)     a. cath 08/09/2003: mild LAD plaque, no obs dzs noted; b. 12/18/10 stress test w/o ischemia; c. cath 11/2014: LM: nl, mid 85-90% s/p PCI/DES, mid LCX 30%, RCA nl  . CVA (cerebral vascular accident) ([place])     a. 06/2008-TEE nl LV fxn; b. 01/2013 - notes indicate short run of SVT at that time  . Multiple sclerosis ([place]) 2015  . Syncope   . Chronic systolic CHF (congestive heart failure) ([place])     a. echo 01/29/2013 EF 40-45%, basal anteroseptal & mid anteroseptal segments abnl; b echo 05/2012 EF 50-55%, mildly dilated LA, nl RVSP  . Orthostatic hypotension   . Aortic arch aneurysm ([place])   . Ischemic cardiomyopathy   . MS (mitral stenosis)   . Malignant melanoma of skin of scalp ([place])   . Hypertension   . Depression   . Diabetes mellitus without complication ([place])   . Headache     Migrains  . Hyperlipidemia   . CKD (chronic kidney disease), stage IV ([place])   . Hypothyroidism   . Anxiety   . Gastric ulcer 04/27/2011  : Past Surgical History  Procedure Laterality Date  . Appendectomy    . Right oophorectomy    . Tubal ligation      R tube removed still w/ Left  . Dilation and curettage of uterus    . Gastric bypass  09/2009    [facility]   . Esophagogastroduodenoscopy (egd) with propofol N/A 09/14/2014    Procedure: ESOPHAGOGASTRODUODENOSCOPY (EGD) WITH PROPOFOL;  Surgeon: Josefine Class, MD;  Location: [facility] ENDOSCOPY;  Service: Endoscopy;  Laterality: N/A;  . Cardiac catheterization N/A 11/09/2014    Procedure: Coronary Angiography;  Surgeon: Minna Merritts, MD;  Location: [place] CV LAB;  Service: Cardiovascular;  Laterality: N/A;  . Cardiac catheterization N/A 11/12/2014    Procedure: Coronary Stent Intervention;  Surgeon: Isaias Cowman, MD;  Location: [place] CV LAB;  Service: Cardiovascular;  Laterality: N/A;  . Trigger  finger release    . Melanoma excision  2016    Dr. Evorn Gong  : Family History  Problem Relation Age of Onset  . Hypertension Mother   . Anxiety disorder Mother   . Depression Mother   . Bipolar disorder Mother   . Heart disease Mother   . Hyperlipidemia Mother   . Kidney disease Father   . Heart disease Father   . Hypertension Father   . Diabetes Father   . Stroke Father   . Colon cancer Father     dx in his 61's  . Anxiety disorder Father   . Depression Father   . Skin cancer Father   . Kidney disease Sister   . Thyroid nodules Sister   . Hypertension Sister   . Hypertension Sister   . Diabetes Sister   . Hyperlipidemia Sister   . Depression Sister   :  reports that she quit smoking about 20 years ago. Her smoking use included Cigarettes. She has never used smokeless tobacco. She reports that she does not  drink alcohol or use illicit drugs.:   Allergies:  Allergies  Allergen Reactions  . Tramadol Other (See Comments)    Reaction:  Burning of pts mouth and confusion      Home Medications:  Current Outpatient Prescriptions  Medication Sig Dispense Refill  . aspirin EC 81 MG tablet Take 81 mg by mouth daily.    . baclofen (LIORESAL) 10 MG tablet Take 10 mg by mouth 3 (three) times daily as needed for muscle spasms.     . Brexpiprazole 2 MG TABS Take 2 mg by mouth daily. One tablet daily. 30 tablet 3  . canagliflozin (INVOKANA) 300 MG TABS tablet Take 300 mg by mouth daily.    . carvedilol (COREG) 12.5 MG tablet Take 1 tablet (12.5 mg total) by mouth 2 (two) times daily. 60 tablet 0  . citalopram (CELEXA) 40 MG tablet Take 1 tablet (40 mg total) by mouth daily. 30 tablet 3  . clopidogrel (PLAVIX) 75 MG tablet Take 1 tablet (75 mg total) by mouth daily. 30 tablet 0  . insulin aspart (NOVOLOG) 100 UNIT/ML injection Inject 4-15 Units into the skin 3 (three) times daily as needed for high blood sugar. Pt uses per sliding scale.    . insulin detemir (LEVEMIR) 100 UNIT/ML  injection Inject 0.25 mLs (25 Units total) into the skin as needed. (Patient taking differently: Inject 25 Units into the skin at bedtime as needed (for high blood sugar). ) 10 mL 1  . isosorbide mononitrate (IMDUR) 30 MG 24 hr tablet Take 1 tablet (30 mg total) by mouth daily. 30 tablet 0  . levothyroxine (SYNTHROID, LEVOTHROID) 25 MCG tablet Take 25 mcg by mouth daily before breakfast.    . metFORMIN (GLUCOPHAGE) 1000 MG tablet Take 1,000 mg by mouth 2 (two) times daily.    . pantoprazole (PROTONIX) 40 MG tablet Take 40 mg by mouth 2 (two) times daily.     . pregabalin (LYRICA) 75 MG capsule Take 1 capsule (75 mg total) by mouth 2 (two) times daily. 28 capsule 0  . promethazine (PHENERGAN) 25 MG tablet Take 25 mg by mouth every 6 (six) hours as needed for nausea or vomiting.    . rosuvastatin (CRESTOR) 20 MG tablet Take 1 tablet (20 mg total) by mouth daily at 6 PM. 30 tablet 0  . temazepam (RESTORIL) 15 MG capsule Take 15 mg by mouth at bedtime.     No current facility-administered medications for this visit.     Review of Systems:  Review of Systems  Constitutional: Positive for malaise/fatigue. Negative for fever, chills, weight loss and diaphoresis.  HENT: Negative for congestion.   Eyes: Negative for discharge and redness.  Respiratory: Positive for shortness of breath. Negative for cough, hemoptysis, sputum production and wheezing.   Cardiovascular: Positive for palpitations. Negative for chest pain, orthopnea, claudication, leg swelling and PND.  Gastrointestinal: Negative for nausea, vomiting, blood in stool and melena.  Genitourinary: Negative for hematuria.  Musculoskeletal: Negative for falls.  Skin: Negative for rash.  Neurological: Positive for weakness. Negative for dizziness, sensory change, speech change, focal weakness and loss of consciousness.  Endo/Heme/Allergies: Does not bruise/bleed easily.  Psychiatric/Behavioral: Negative for substance abuse. The patient is  nervous/anxious.      Physical Exam:  Blood pressure 140/90, pulse 105, height 5\' 3"  (1.6 m), weight 182 lb 12 oz (82.895 kg). BMI: Body mass index is 32.38 kg/(m^2). General: Pleasant, NAD. Psych: Normal affect. Responds to questions with normal affect.  Neuro: Alert and oriented  X 3. Moves all extremities spontaneously. HEENT: Normocephalic, atraumatic. EOM intact bilaterally. Sclera anicteric.  Neck: Trachea midline. Supple without bruits or JVD. Lungs:  Respirations regular and unlabored, CTA bilaterally without wheezing, crackles, or rhonchi.  Heart: RRR, normal s3, s4. No murmurs, rubs, or gallops.  Abdomen: Obese, soft, non-tender, non-distended, BS + x 4.  Extremities: No clubbing, cyanosis or edema. DP/PT/Radials 2+ and equal bilaterally.   Accessory Clinical Findings:  EKG: sinus tachycardia, 105, bpm, nonspecific lateral st/t changes   Recent Labs: 12/30/2013: SGPT (ALT) 33 11/13/2014: BUN 13; Creatinine, Ser 1.26*; Hemoglobin 9.6*; Platelets 159; Potassium 4.4; Sodium 143     Component Value Date/Time   CHOL 164 11/09/2014 0454   CHOL 197 12/30/2013 0355   TRIG 259* 11/09/2014 0454   TRIG 261* 12/30/2013 0355   HDL 27* 11/09/2014 0454   HDL 28* 12/30/2013 0355   CHOLHDL 6.1 11/09/2014 0454   VLDL 52* 11/09/2014 0454   VLDL 52* 12/30/2013 0355   LDLCALC 85 11/09/2014 0454   LDLCALC 117* 12/30/2013 0355   LDLDIRECT 83.3 01/26/2011 0913    Weights: Wt Readings from Last 3 Encounters:  11/23/14 182 lb 12 oz (82.895 kg)  11/12/14 185 lb 13.6 oz (84.3 kg)  10/02/14 177 lb 6.4 oz (80.468 kg)    Estimated Creatinine Clearance: 56.9 mL/min (by C-G formula based on Cr of 1.26).   Other studies Reviewed: Additional studies/ records that were reviewed today include: prior office notes and [facility] admission.  Assessment & Plan:  1. CAD s/p PCI/DES as above:  -Currently without any angina -Continue aspirin 81 mg daily and Plavix 75 mg daily for at least 12 months  from the date of her PCI (11/12/2014)  -Continue Imdur  -Coreg increased to 25 mg bid given she was slightly tachycardic at 105 bpm and hypertensive at 140/90   2. Ischemic cardiomyopathy:  -Check echo to evaluate LV function and right side pressure  -Increase Coreg to 25 mg bid given she was slightly tachycardic and hypertensive as above -She appears euvolemic on exam -Given her history of CKD as below, will not add ACEi or spironolactone at this time   3. Palpitations: -Intermittent, lasting approximately 5-6 minutes when they occur -Check 48 hour Holter -If no palpitations are seen may need longer outpatient monitoring   4. Hypertension:  -Slightly hypertensive on today's visit -Increase Coreg to 25 mg bid -Imdur 30 mg daily  5. History of previous strokes:  -Aspirin and Plavix as above  -Stable  6. CKD stage IV:  -Check bmet -She has follow up with nephrology in February 2017  7. History of left subclavian artery bypass:  -Stable  8. DM2:  -Per PCP   9. HLD:  -Crestor 20 mg daily -Goal LDL <70 -Patient reports PCP plans to draw fasting lipid panel at the end of November    Dispo: -Follow up 6 months with Dr. [name], MD  Current medicines are reviewed at length with the patient today.  The patient did not have any concerns regarding medicines.   Christell Faith, PA-C [facility] [address] [place], [place] [address] [phone] [place] 11/23/2014, 2:49 PM

## 2014-11-23 NOTE — Patient Instructions (Addendum)
Medication Instructions:  Your physician has recommended you make the following change in your medication:  INCREASE coreg 25mg  twice per day   Labwork: BMET  Testing/Procedures: Your physician has requested that you have an echocardiogram. Echocardiography is a painless test that uses sound waves to create images of your heart. It provides your doctor with information about the size and shape of your heart and how well your heart's chambers and valves are working. This procedure takes approximately one hour. There are no restrictions for this procedure.  Your physician has recommended that you wear a holter monitor. Holter monitors are medical devices that record the heart's electrical activity. Doctors most often use these monitors to diagnose arrhythmias. Arrhythmias are problems with the speed or rhythm of the heartbeat. The monitor is a small, portable device. You can wear one while you do your normal daily activities. This is usually used to diagnose what is causing palpitations/syncope (passing out).    Follow-Up: Your physician recommends that you schedule a follow-up appointment in: six months with Dr. [name]   Any Other Special Instructions Will Be Listed Below (If Applicable).  Echocardiogram An echocardiogram, or echocardiography, uses sound waves (ultrasound) to produce an image of your heart. The echocardiogram is simple, painless, obtained within a short period of time, and offers valuable information to your health care provider. The images from an echocardiogram can provide information such as:  Evidence of coronary artery disease (CAD).  Heart size.  Heart muscle function.  Heart valve function.  Aneurysm detection.  Evidence of a past heart attack.  Fluid buildup around the heart.  Heart muscle thickening.  Assess heart valve function. LET [facility] CARE PROVIDER KNOW ABOUT:  Any allergies you have.  All medicines you are taking, including vitamins,  herbs, eye drops, creams, and over-the-counter medicines.  Previous problems you or members of your family have had with the use of anesthetics.  Any blood disorders you have.  Previous surgeries you have had.  Medical conditions you have.  Possibility of pregnancy, if this applies. BEFORE THE PROCEDURE  No special preparation is needed. Eat and drink normally.  PROCEDURE   In order to produce an image of your heart, gel will be applied to your chest and a wand-like tool (transducer) will be moved over your chest. The gel will help transmit the sound waves from the transducer. The sound waves will harmlessly bounce off your heart to allow the heart images to be captured in real-time motion. These images will then be recorded.  You may need an IV to receive a medicine that improves the quality of the pictures. AFTER THE PROCEDURE You may return to your normal schedule including diet, activities, and medicines, unless your health care provider tells you otherwise.   This information is not intended to replace advice given to you by your health care provider. Make sure you discuss any questions you have with your health care provider.   Document Released: 01/17/2000 Document Revised: 02/09/2014 Document Reviewed: 09/26/2012 Elsevier Interactive Patient Education 2016 [place]. Cardiac Event Monitoring A cardiac event monitor is a small recording device used to help detect abnormal heart rhythms (arrhythmias). The monitor is used to record heart rhythm when noticeable symptoms such as the following occur:  Fast heartbeats (palpitations), such as heart racing or fluttering.  Dizziness.  Fainting or light-headedness.  Unexplained weakness. The monitor is wired to two electrodes placed on your chest. Electrodes are flat, sticky disks that attach to your skin. The monitor can  be worn for up to 30 days. You will wear the monitor at all times, except when bathing.  HOW TO USE YOUR  CARDIAC EVENT MONITOR A technician will prepare your chest for the electrode placement. The technician will show you how to place the electrodes, how to work the monitor, and how to replace the batteries. Take time to practice using the monitor before you leave the office. Make sure you understand how to send the information from the monitor to your health care provider. This requires a telephone with a landline, not a cell phone. You need to:  Wear your monitor at all times, except when you are in water:  Do not get the monitor wet.  Take the monitor off when bathing. Do not swim or use a hot tub with it on.  Keep your skin clean. Do not put body lotion or moisturizer on your chest.  Change the electrodes daily or any time they stop sticking to your skin. You might need to use tape to keep them on.  It is possible that your skin under the electrodes could become irritated. To keep this from happening, try to put the electrodes in slightly different places on your chest. However, they must remain in the area under your left breast and in the upper right section of your chest.  Make sure the monitor is safely clipped to your clothing or in a location close to your body that your health care provider recommends.  Press the button to record when you feel symptoms of heart trouble, such as dizziness, weakness, light-headedness, palpitations, thumping, shortness of breath, unexplained weakness, or a fluttering or racing heart. The monitor is always on and records what happened slightly before you pressed the button, so do not worry about being too late to get good information.  Keep a diary of your activities, such as walking, doing chores, and taking medicine. It is especially important to note what you were doing when you pushed the button to record your symptoms. This will help your health care provider determine what might be contributing to your symptoms. The information stored in your monitor  will be reviewed by your health care provider alongside your diary entries.  Send the recorded information as recommended by your health care provider. It is important to understand that it will take some time for your health care provider to process the results.  Change the batteries as recommended by your health care provider. SEEK IMMEDIATE MEDICAL CARE IF:   You have chest pain.  You have extreme difficulty breathing or shortness of breath.  You develop a very fast heartbeat that persists.  You develop dizziness that does not go away.  You faint or constantly feel you are about to faint.   This information is not intended to replace advice given to you by your health care provider. Make sure you discuss any questions you have with your health care provider.   Document Released: 10/29/2007 Document Revised: 02/09/2014 Document Reviewed: 07/18/2012 Elsevier Interactive Patient Education [employer].

## 2014-11-24 LAB — BASIC METABOLIC PANEL
BUN / CREAT RATIO: 10 (ref 9–23)
BUN: 17 mg/dL (ref 6–24)
CALCIUM: 9 mg/dL (ref 8.7–10.2)
CHLORIDE: 104 mmol/L (ref 97–106)
CO2: 18 mmol/L (ref 18–29)
CREATININE: 1.71 mg/dL — AB (ref 0.57–1.00)
GFR calc non Af Amer: 35 mL/min/{1.73_m2} — ABNORMAL LOW (ref >59)
GFR, EST AFRICAN AMERICAN: 41 mL/min/{1.73_m2} — AB (ref >59)
Glucose: 93 mg/dL (ref 65–99)
Potassium: 4.7 mmol/L (ref 3.5–5.2)
Sodium: 141 mmol/L (ref 136–144)

## 2014-11-27 ENCOUNTER — Encounter: Payer: Self-pay | Admitting: Physician Assistant

## 2014-11-28 ENCOUNTER — Encounter: Payer: Self-pay | Admitting: Family Medicine

## 2014-11-28 ENCOUNTER — Ambulatory Visit (INDEPENDENT_AMBULATORY_CARE_PROVIDER_SITE_OTHER): Payer: BC Managed Care – PPO | Admitting: Cardiovascular Disease

## 2014-11-28 ENCOUNTER — Encounter: Payer: Self-pay | Admitting: Cardiovascular Disease

## 2014-11-28 VITALS — BP 150/96 | HR 85 | Ht 63.0 in | Wt 184.5 lb

## 2014-11-28 DIAGNOSIS — R079 Chest pain, unspecified: Secondary | ICD-10-CM | POA: Diagnosis not present

## 2014-11-28 DIAGNOSIS — E785 Hyperlipidemia, unspecified: Secondary | ICD-10-CM

## 2014-11-28 DIAGNOSIS — I471 Supraventricular tachycardia: Secondary | ICD-10-CM | POA: Diagnosis not present

## 2014-11-28 DIAGNOSIS — I255 Ischemic cardiomyopathy: Secondary | ICD-10-CM | POA: Diagnosis not present

## 2014-11-28 DIAGNOSIS — R000 Tachycardia, unspecified: Secondary | ICD-10-CM

## 2014-11-28 DIAGNOSIS — I25111 Atherosclerotic heart disease of native coronary artery with angina pectoris with documented spasm: Secondary | ICD-10-CM | POA: Diagnosis not present

## 2014-11-28 DIAGNOSIS — N184 Chronic kidney disease, stage 4 (severe): Secondary | ICD-10-CM

## 2014-11-28 MED ORDER — CARVEDILOL 12.5 MG PO TABS: 12.5000 mg | ORAL_TABLET | Freq: Two times a day (BID) | ORAL | Status: DC

## 2014-11-28 NOTE — Assessment & Plan Note (Signed)
Renal failure secondary to long-standing diabetes.  Stressed importance of aggressive diabetes control

## 2014-11-28 NOTE — Assessment & Plan Note (Signed)
Atypical chest pain since her stent was placed. Fatigue Encouraged her to start a regular walking program Stressed importance of aggressive diabetes control

## 2014-11-28 NOTE — Assessment & Plan Note (Signed)
We have encouraged continued exercise, careful diet management in an effort to lose weight. 

## 2014-11-28 NOTE — Assessment & Plan Note (Signed)
She continues to have paroxysmal tachycardia. Holter monitor showing narrow complex tachycardia at 150 bpm. Appears to be an atrial tachycardia. Increase Coreg up to 37.5 mg twice a day if tolerated We'll recommend a 30 day monitor

## 2014-11-28 NOTE — Assessment & Plan Note (Signed)
Currently with no symptoms of angina. No further workup at this time. Continue current medication regimen. 

## 2014-11-28 NOTE — Assessment & Plan Note (Signed)
Ideally LDL should be less than 70. In follow-up visit, will recommend she increase her Crestor up to 40 mg daily

## 2014-11-28 NOTE — Patient Instructions (Addendum)
Recent holter shows sinus tachycardia, rates 150 beats perminute  Please increase the coreg up to 1 1/2 twice a day (37.5 mg twice a day)  We will order a 30 day monitor for tachycardia  Please call us if you have new issues that need to be addressed before your next appt.  Your physician wants you to follow-up in: 6 weeks You will receive a reminder letter in the mail two months in advance. If you don't receive a letter, please call our office to schedule the follow-up appointment.    Cardiac Event Monitoring A cardiac event monitor is a small recording device used to help detect abnormal heart rhythms (arrhythmias). The monitor is used to record heart rhythm when noticeable symptoms such as the following occur:  Fast heartbeats (palpitations), such as heart racing or fluttering.  Dizziness.  Fainting or light-headedness.  Unexplained weakness. The monitor is wired to two electrodes placed on your chest. Electrodes are flat, sticky disks that attach to your skin. The monitor can be worn for up to 30 days. You will wear the monitor at all times, except when bathing.  HOW TO USE YOUR CARDIAC EVENT MONITOR A technician will prepare your chest for the electrode placement. The technician will show you how to place the electrodes, how to work the monitor, and how to replace the batteries. Take time to practice using the monitor before you leave the office. Make sure you understand how to send the information from the monitor to your health care provider. This requires a telephone with a landline, not a cell phone. You need to:  Wear your monitor at all times, except when you are in water:  Do not get the monitor wet.  Take the monitor off when bathing. Do not swim or use a hot tub with it on.  Keep your skin clean. Do not put body lotion or moisturizer on your chest.  Change the electrodes daily or any time they stop sticking to your skin. You might need to use tape to keep them  on.  It is possible that your skin under the electrodes could become irritated. To keep this from happening, try to put the electrodes in slightly different places on your chest. However, they must remain in the area under your left breast and in the upper right section of your chest.  Make sure the monitor is safely clipped to your clothing or in a location close to your body that your health care provider recommends.  Press the button to record when you feel symptoms of heart trouble, such as dizziness, weakness, light-headedness, palpitations, thumping, shortness of breath, unexplained weakness, or a fluttering or racing heart. The monitor is always on and records what happened slightly before you pressed the button, so do not worry about being too late to get good information.  Keep a diary of your activities, such as walking, doing chores, and taking medicine. It is especially important to note what you were doing when you pushed the button to record your symptoms. This will help your health care provider determine what might be contributing to your symptoms. The information stored in your monitor will be reviewed by your health care provider alongside your diary entries.  Send the recorded information as recommended by your health care provider. It is important to understand that it will take some time for your health care provider to process the results.  Change the batteries as recommended by your health care provider. SEEK IMMEDIATE MEDICAL CARE IF:  You have chest pain.  You have extreme difficulty breathing or shortness of breath.  You develop a very fast heartbeat that persists.  You develop dizziness that does not go away.  You faint or constantly feel you are about to faint.   This information is not intended to replace advice given to you by your health care provider. Make sure you discuss any questions you have with your health care provider.   Document Released:  10/29/2007 Document Revised: 02/09/2014 Document Reviewed: 07/18/2012 Elsevier Interactive Patient Education 2016 Else[employer]   Nonspecific Tachycardia Tachycardia is a faster than normal heartbeat (more than 100 beats per minute). In adults, the heart normally beats between 60 and 100 times a minute. A fast heartbeat may be a normal response to exercise or stress. It does not necessarily mean that something is wrong. However, sometimes when your heart beats too fast it may not be able to pump enough blood to the rest of your body. This can result in chest pain, shortness of breath, dizziness, and even fainting. Nonspecific tachycardia means that the specific cause or pattern of your tachycardia is unknown. CAUSES  Tachycardia may be harmless or it may be due to a more serious underlying cause. Possible causes of tachycardia include:  Exercise or exertion.  Fever.  Pain or injury.  Infection.  Loss of body fluids (dehydration).  Overactive thyroid.  Lack of red blood cells (anemia).  Anxiety and stress.  Alcohol.  Caffeine.  Tobacco products.  Diet pills.  Illegal drugs.  Heart disease. SYMPTOMS  Rapid or irregular heartbeat (palpitations).  Suddenly feeling your heart beating (cardiac awareness).  Dizziness.  Tiredness (fatigue).  Shortness of breath.  Chest pain.  Nausea.  Fainting. DIAGNOSIS  Your caregiver will perform a physical exam and take your medical history. In some cases, a heart specialist (cardiologist) may be consulted. Your caregiver may also order:  Blood tests.  Electrocardiography. This test records the electrical activity of your heart.  A heart monitoring test. TREATMENT  Treatment will depend on the likely cause of your tachycardia. The goal is to treat the underlying cause of your tachycardia. Treatment methods may include:  Replacement of fluids or blood through an intravenous (IV) tube for moderate to severe dehydration or  anemia.  New medicines or changes in your current medicines.  Diet and lifestyle changes.  Treatment for certain infections.  Stress relief or relaxation methods. HOME CARE INSTRUCTIONS   Rest.  Drink enough fluids to keep your urine clear or pale yellow.  Do not smoke.  Avoid:  Caffeine.  Tobacco.  Alcohol.  Chocolate.  Stimulants such as over-the-counter diet pills or pills that help you stay awake.  Situations that cause anxiety or stress.  Illegal drugs such as marijuana, phencyclidine (PCP), and cocaine.  Only take medicine as directed by your caregiver.  Keep all follow-up appointments as directed by your caregiver. SEEK IMMEDIATE MEDICAL CARE IF:   You have pain in your chest, upper arms, jaw, or neck.  You become weak, dizzy, or feel faint.  You have palpitations that will not go away.  You vomit, have diarrhea, or pass blood in your stool.  Your skin is cool, pale, and wet.  You have a fever that will not go away with rest, fluids, and medicine. MAKE SURE YOU:   Understand these instructions.  Will watch your condition.  Will get help right away if you are not doing well or get worse.   This information is not intended  to replace advice given to you by your health care provider. Make sure you discuss any questions you have with your health care provider.   Document Released: 02/27/2004 Document Revised: 04/13/2011 Document Reviewed: 08/03/2014 Elsevier Interactive Patient Education [employer].

## 2014-11-28 NOTE — Progress Notes (Signed)
Patient ID: [name], female    DOB: Apr 26, 1970, 44 y.o.   MRN: [id number]  HPI Comments: 44 year old woman with a long hx of smoking, numerous prior strokes,  history of chest pain dating back to 2005 at that time with cardiac catheterization on August 09, 2003 found to have mild plaque in the LAD, but there was no obstructive disease noted.   MUGA, which showed an ejection fraction of 51%.   Transesophageal echocardiogram in the setting of a CVA in May 2010  showed normal LV function, s/p gastric bypass surgery, Who presents for routine followup of her tachycardia and chest pain She is status post left carotid to subclavian artery bypass with subclavian artery ligation, done at [facility] by vascular surgery admission to [facility] from 10/5 to 10/11 with unstable angina and was found to have severe mid LAD lesion at 85-90% stenosis on diagnositc cardiac cath s/p PCI/DES on staged procedure.   In follow-up today, she reports having periods of tachycardia Tachycardia sporadic, comes and goes. Relatively symptomatic Holter monitor was previously ordered through our office that showed episode of narrow complex tachycardia with rate 150 bpm She reports that she was symptomatic Carvedilol was increased up to 25 mill grams twice a day. She has been on the higher dose for 6 days, continues to have symptoms Doing some low-grade exercise. Last weekend walked up to 1 mile at her pace  EKG on today's visit shows normal sinus rhythm with rate 85 bpm, no significant ST or T-wave changes  Other past medical history CT in October 2015 showing Right aortic arch with a large retro- esophageal vascular segment, persistent dorsal aortic segment) giving off aberrant left subclavian artery. This large caliber retroaortic segment measuring 18 mm compared to the ascending aorta which measures 22 mm. This large retroesophageal aortic segment along with the partially calcified atretic ductus ligament between the left  aberrant subclavian artery and the left pulmonary artery creates an ALMOSTcomplete vascular ring. This is likelythe cause ofpatient's dysphagia [place].  44 year old female with history of previously nonobstructive CAD by cardiac cath on August 09, 2003, numerous prior strokes, chronic systolic CHF, aortic arch aneurysm, aberrant left subclavian artery s/p bypass surgery, s/p gastric bypass surgery, CKD stage III, multiple sclerosis, orthostatic hypotension, HTN, HLD, syncope, hypothyroidism, migraine disorder, anxiety, and depression who presents for hospital follow up after recent   Previously was taking midodrine or Florinef for hypotension following her stroke.  EKG on today's visit shows normal sinus rhythm with rate 89 bpm, nonspecific ST abnormality  She previously presented to the hospital December 18, 2010 with chest pain.   stress test in the hospital and showed no ischemia. Cardiac enzymes were negative. No EKG changes noted.   admission to the hospital 11/01/2012 with discharge 11/02/2012 with chest pain. She ruled out with negative cardiac enzymes. Blood pressures were in the [id number] systolic range. Discharge blood pressure medications included amlodipine 10 mg daily, benazepril 40 mg daily, clonidine 0.2 mg., Coreg 3.125 mg twice a day  stroke and admission to the hospital at the end of December 2014. She had acute onset left sided weakness including arm and leg. She reports that review of MRI showed acute stroke, as well as previous strokes. She was seen by neurology and aspirin changed to Plavix 75 mg daily. Hemoglobin A1c 8.5. Notes indicate short run of SVT while she was in the hospital and on the monitor.  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  Echocardiogram 01/29/2013 suggest ejection fraction 40-45% CT scan of the head showing small vessel disease MRI showing acute infarct right  parietal white matter, extensive chronic microvascular ischemic changes  Echocardiogram April 2014 showed ejection fraction 50-55%, otherwise normal study Stress test 11/02/2012 showing no ischemia,   Allergies  Allergen Reactions  . Tramadol Other (See Comments)    Reaction:  Burning of pts mouth and confusion     Outpatient Encounter Prescriptions as of 11/28/2014  Medication Sig  . aspirin EC 81 MG tablet Take 81 mg by mouth daily.  . baclofen (LIORESAL) 10 MG tablet Take 10 mg by mouth 3 (three) times daily as needed for muscle spasms.   . Brexpiprazole 2 MG TABS Take 2 mg by mouth daily. One tablet daily.  . canagliflozin (INVOKANA) 300 MG TABS tablet Take 300 mg by mouth daily.  . carvedilol (COREG) 25 MG tablet Take 1 tablet (25 mg total) by mouth 2 (two) times daily.  . citalopram (CELEXA) 40 MG tablet Take 1 tablet (40 mg total) by mouth daily.  . clopidogrel (PLAVIX) 75 MG tablet Take 1 tablet (75 mg total) by mouth daily.  . insulin aspart (NOVOLOG) 100 UNIT/ML injection Inject 4-15 Units into the skin 3 (three) times daily as needed for high blood sugar. Pt uses per sliding scale.  . insulin detemir (LEVEMIR) 100 UNIT/ML injection Inject 0.25 mLs (25 Units total) into the skin as needed. (Patient taking differently: Inject 25 Units into the skin at bedtime as needed (for high blood sugar). )  . isosorbide mononitrate (IMDUR) 30 MG 24 hr tablet Take 1 tablet (30 mg total) by mouth daily.  Marland Kitchen levothyroxine (SYNTHROID, LEVOTHROID) 25 MCG tablet Take 25 mcg by mouth daily before breakfast.  . metFORMIN (GLUCOPHAGE) 1000 MG tablet Take 1,000 mg by mouth 2 (two) times daily.  . pantoprazole (PROTONIX) 40 MG tablet Take 40 mg by mouth 2 (two) times daily.   . pregabalin (LYRICA) 75 MG capsule Take 1 capsule (75 mg total) by mouth 2 (two) times daily.  . promethazine (PHENERGAN) 25 MG tablet Take 25 mg by mouth every 6 (six) hours as needed for nausea or vomiting.  . rosuvastatin  (CRESTOR) 20 MG tablet Take 1 tablet (20 mg total) by mouth daily at 6 PM.  . temazepam (RESTORIL) 15 MG capsule Take 15 mg by mouth at bedtime.  . carvedilol (COREG) 12.5 MG tablet Take 1 tablet (12.5 mg total) by mouth 2 (two) times daily.   No facility-administered encounter medications on file as of 11/28/2014.    Past Medical History  Diagnosis Date  . Colon polyp   . GERD (gastroesophageal reflux disease)   . CAD (coronary artery disease)     a. cath 08/09/2003: mild LAD plaque, no obs dzs noted; b. 12/18/10 stress test w/o ischemia; c. cath 11/2014: LM: nl, mid 85-90% s/p PCI/DES, mid LCX 30%, RCA nl  . CVA (cerebral vascular accident) ([place])     a. 06/2008-TEE nl LV fxn; b. 01/2013 - notes indicate short run of SVT at that time  . Multiple sclerosis ([place]) 2015  . Syncope   . Chronic systolic CHF (congestive heart failure) ([place])     a. echo 01/29/2013 EF 40-45%, basal anteroseptal & mid anteroseptal segments abnl; b echo 05/2012 EF 50-55%, mildly dilated LA, nl RVSP  . Orthostatic hypotension   . Aortic arch aneurysm ([place])   . Ischemic cardiomyopathy   . MS (  mitral stenosis)   . Malignant melanoma of skin of scalp ([place])   . Hypertension   . Depression   . Diabetes mellitus without complication ([place])   . Headache     Migrains  . Hyperlipidemia   . CKD (chronic kidney disease), stage IV ([name])   . Hypothyroidism   . Anxiety   . Gastric ulcer 04/27/2011    Past Surgical History  Procedure Laterality Date  . Appendectomy    . Right oophorectomy    . Tubal ligation      R tube removed still w/ Left  . Dilation and curettage of uterus    . Gastric bypass  09/2009    [facility]   . Esophagogastroduodenoscopy (egd) with propofol N/A 09/14/2014    Procedure: ESOPHAGOGASTRODUODENOSCOPY (EGD) WITH PROPOFOL;  Surgeon: Josefine Class, MD;  Location: [facility] ENDOSCOPY;  Service: Endoscopy;  Laterality: N/A;  . Cardiac catheterization N/A 11/09/2014    Procedure:  Coronary Angiography;  Surgeon: Minna Merritts, MD;  Location: [place] CV LAB;  Service: Cardiovascular;  Laterality: N/A;  . Cardiac catheterization N/A 11/12/2014    Procedure: Coronary Stent Intervention;  Surgeon: Isaias Cowman, MD;  Location: [place] CV LAB;  Service: Cardiovascular;  Laterality: N/A;  . Trigger finger release    . Melanoma excision  2016    Dr. Evorn Gong    Social History  reports that she quit smoking about 20 years ago. Her smoking use included Cigarettes. She has never used smokeless tobacco. She reports that she does not drink alcohol or use illicit drugs.  Family History family history includes Anxiety disorder in her father and mother; Bipolar disorder in her mother; Colon cancer in her father; Depression in her father, mother, and sister; Diabetes in her father and sister; Heart disease in her father and mother; Hyperlipidemia in her mother and sister; Hypertension in her father, mother, sister, and sister; Kidney disease in her father and sister; Skin cancer in her father; Stroke in her father; Thyroid nodules in her sister.     Review of Systems  Constitutional: Negative.   Respiratory: Negative.   Cardiovascular: Negative.   Gastrointestinal: Negative.   Musculoskeletal: Negative.   Neurological: Negative.        Left arm, left leg weakness  Hematological: Negative.   All other systems reviewed and are negative.   BP 150/96 mmHg  Pulse 85  Ht 5\' 3"  (1.6 m)  Wt 184 lb 8 oz (83.689 kg)  BMI 32.69 kg/m2  LMP  (Approximate)  Physical Exam  Constitutional: She is oriented to person, place, and time. She appears well-developed and well-nourished.  HENT:  Head: Normocephalic.  Nose: Nose normal.  Mouth/Throat: Oropharynx is clear and moist.  Eyes: Conjunctivae are normal. Pupils are equal, round, and reactive to light.  Neck: Normal range of motion. Neck supple. No JVD present.  Cardiovascular: Normal rate, regular rhythm, S1  normal, S2 normal, normal heart sounds and intact distal pulses.  Exam reveals no gallop and no friction rub.   No murmur heard. Pulmonary/Chest: Effort normal and breath sounds normal. No respiratory distress. She has no wheezes. She has no rales. She exhibits no tenderness.  Abdominal: Soft. Bowel sounds are normal. She exhibits no distension. There is no tenderness.  Musculoskeletal: Normal range of motion. She exhibits no edema or tenderness.  Lymphadenopathy:    She has no cervical adenopathy.  Neurological: She is alert and oriented to person, place, and time. Coordination normal.  Skin: Skin  is warm and dry. No rash noted. No erythema.  Psychiatric: She has a normal mood and affect. Her behavior is normal. Judgment and thought content normal.    Assessment and Plan  Nursing note and vitals reviewed.

## 2014-11-29 ENCOUNTER — Encounter (INDEPENDENT_AMBULATORY_CARE_PROVIDER_SITE_OTHER): Payer: BC Managed Care – PPO

## 2014-11-29 ENCOUNTER — Ambulatory Visit: Payer: Self-pay | Admitting: Psychiatry

## 2014-11-29 DIAGNOSIS — I471 Supraventricular tachycardia: Secondary | ICD-10-CM | POA: Diagnosis not present

## 2014-11-29 DIAGNOSIS — R079 Chest pain, unspecified: Secondary | ICD-10-CM

## 2014-12-05 ENCOUNTER — Encounter: Payer: Self-pay | Admitting: Psychiatry

## 2014-12-05 ENCOUNTER — Ambulatory Visit (INDEPENDENT_AMBULATORY_CARE_PROVIDER_SITE_OTHER): Payer: BC Managed Care – PPO | Admitting: Psychiatry

## 2014-12-05 VITALS — BP 124/88 | HR 84 | Temp 98.6°F | Ht 63.0 in | Wt 179.8 lb

## 2014-12-05 DIAGNOSIS — F331 Major depressive disorder, recurrent, moderate: Secondary | ICD-10-CM | POA: Diagnosis not present

## 2014-12-05 MED ORDER — CITALOPRAM HYDROBROMIDE 40 MG PO TABS: 40.0000 mg | ORAL_TABLET | Freq: Every day | ORAL | Status: DC

## 2014-12-05 MED ORDER — TEMAZEPAM 22.5 MG PO CAPS: 22.5000 mg | ORAL_CAPSULE | Freq: Every day | ORAL | Status: DC

## 2014-12-05 NOTE — Progress Notes (Signed)
[facility] MD/PA/NP OP Progress Note  12/05/2014 2:10 PM Gordon  MRN:  [id number]  Subjective:  Patient returns for follow-up of her major depressive disorder. Patient indicated that over the past 2 months she's had a significant stressor for her medical problems. She states she had a heart attack and then is now had a catheterization for which there is one location with a 90% blockage. She states she is now being worked up for arrhythmias. She states she did wear a 48 hour monitor and now is on a 30 day monitoring program. That being said she stated that while she was in the hospital she did not have her rexulti in the hospital did not stock it. She said as such she has not been taking the ricks salty and she states her husband thinks she is better without it. She states she does still feel like the citalopram has been helpful for her mood. She states that since she is now been out of work for the cardiac issues she's been moving around with her husband and they've been taking various trips and running errands. She states that she typically has been enjoying this.  She states that sleep was under control with the Restoril but now she notes that she is not sleeping as long on the medication. Chief Complaint: been a rough past two months Chief Complaint    Follow-up; Medication Refill; Medication Problem; Depression; Anxiety     Visit Diagnosis:     ICD-9-CM ICD-10-CM   1. Major depressive disorder, recurrent episode, moderate (HCC) 296.32 F33.1     Past Medical History:  Past Medical History  Diagnosis Date  . Colon polyp   . GERD (gastroesophageal reflux disease)   . CAD (coronary artery disease)     a. cath 08/09/2003: mild LAD plaque, no obs dzs noted; b. 12/18/10 stress test w/o ischemia; c. cath 11/2014: LM: nl, mid 85-90% s/p PCI/DES, mid LCX 30%, RCA nl  . CVA (cerebral vascular accident) ([place])     a. 06/2008-TEE nl LV fxn; b. 01/2013 - notes indicate short run of SVT at that time  .  Multiple sclerosis ([place]) 2015  . Syncope   . Chronic systolic CHF (congestive heart failure) ([place])     a. echo 01/29/2013 EF 40-45%, basal anteroseptal & mid anteroseptal segments abnl; b echo 05/2012 EF 50-55%, mildly dilated LA, nl RVSP  . Orthostatic hypotension   . Aortic arch aneurysm ([place])   . Ischemic cardiomyopathy   . MS (mitral stenosis)   . Malignant melanoma of skin of scalp ([place])   . Hypertension   . Depression   . Diabetes mellitus without complication ([place])   . Headache     Migrains  . Hyperlipidemia   . CKD (chronic kidney disease), stage IV ([place])   . Hypothyroidism   . Anxiety   . Gastric ulcer 04/27/2011  . Heart attack [facility])     Past Surgical History  Procedure Laterality Date  . Appendectomy    . Right oophorectomy    . Tubal ligation      R tube removed still w/ Left  . Dilation and curettage of uterus    . Gastric bypass  09/2009    [facility]   . Esophagogastroduodenoscopy (egd) with propofol N/A 09/14/2014    Procedure: ESOPHAGOGASTRODUODENOSCOPY (EGD) WITH PROPOFOL;  Surgeon: Josefine Class, MD;  Location: [facility] ENDOSCOPY;  Service: Endoscopy;  Laterality: N/A;  . Cardiac catheterization N/A 11/09/2014  Procedure: Coronary Angiography;  Surgeon: Minna Merritts, MD;  Location: [place] CV LAB;  Service: Cardiovascular;  Laterality: N/A;  . Cardiac catheterization N/A 11/12/2014    Procedure: Coronary Stent Intervention;  Surgeon: Isaias Cowman, MD;  Location: [place] CV LAB;  Service: Cardiovascular;  Laterality: N/A;  . Trigger finger release    . Melanoma excision  2016    Dr. Evorn Gong   Family History:  Family History  Problem Relation Age of Onset  . Hypertension Mother   . Anxiety disorder Mother   . Depression Mother   . Bipolar disorder Mother   . Heart disease Mother   . Hyperlipidemia Mother   . Kidney disease Father   . Heart disease Father   . Hypertension Father   . Diabetes Father   . Stroke  Father   . Colon cancer Father     dx in his 22's  . Anxiety disorder Father   . Depression Father   . Skin cancer Father   . Kidney disease Sister   . Thyroid nodules Sister   . Hypertension Sister   . Hypertension Sister   . Diabetes Sister   . Hyperlipidemia Sister   . Depression Sister    Social History:  Social History   Social History  . Marital Status: Married    Spouse Name: N/A  . Number of Children: 1  . Years of Education: N/A   Occupational History  . 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 Disabled through 08/02/2013 per forms 02/03/2013   Social History Main Topics  . Smoking status: Former Smoker    Types: Cigarettes    Quit date: 08/31/1994  . Smokeless tobacco: Never Used     Comment: quit 28 years ago  . Alcohol Use: No  . Drug Use: No  . Sexual Activity: Not Currently    Birth Control/ Protection: None   Other Topics Concern  . None   Social History Narrative   previously did custolial work. 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 Disabled through 08/02/2013 per forms 02/03/13   Additional History:   Assessment:   Musculoskeletal: Strength & Muscle Tone: within normal limits Gait & Station: normal Patient leans: N/A  Psychiatric Specialty Exam: Depression        Associated symptoms include insomnia.  Associated symptoms include no suicidal ideas.  Past medical history includes anxiety.   Anxiety Symptoms include insomnia. Patient reports no nervous/anxious behavior or suicidal ideas.      Review of Systems  Psychiatric/Behavioral: Negative for depression, suicidal ideas, hallucinations, memory loss and substance abuse. The patient has insomnia. The patient is not nervous/anxious.   All other systems reviewed and are negative.   Blood pressure 124/88, pulse 84, temperature 98.6 F (37 C), temperature source Tympanic, height 5\' 3"  (1.6 m),  weight 179 lb 12.8 oz (81.557 kg), SpO2 98 %.Body mass index is 31.86 kg/(m^2).  General Appearance: Neat and Well Groomed  Eye Contact:  Good  Speech:  Normal Rate  Volume:  Normal  Mood:  Okay  Affect:  Appropriate and able to smile despite her ongoing health issues  Thought Process:  Linear and Logical  Orientation:  Full (Time, Place, and Person)  Thought Content:  Negative  Suicidal Thoughts:  No  Homicidal Thoughts:  No  Memory:  Immediate;   Good Recent;   Good Remote;   Good  Judgement:  Good  Insight:  Good  Psychomotor Activity:  Negative  Concentration:  Good  Recall:  Good  Fund of Knowledge: Good  Language: Good  Akathisia:  Negative  Handed:  Right  AIMS (if indicated):  Done on 3/16 and was normal  Assets:  Communication Skills Desire for Improvement Social Support  ADL's:  Intact  Cognition: WNL  Sleep:  At times too much   Is the patient at risk to self?  No. Has the patient been a risk to self in the past 6 months?  No. Has the patient been a risk to self within the distant past?  Yes.   November 2015 Is the patient a risk to others?  No. Has the patient been a risk to others in the past 6 months?  No. Has the patient been a risk to others within the distant past?  No.  Current Medications: Current Outpatient Prescriptions  Medication Sig Dispense Refill  . aspirin EC 81 MG tablet Take 81 mg by mouth daily.    . baclofen (LIORESAL) 10 MG tablet Take 10 mg by mouth 3 (three) times daily as needed for muscle spasms.     . canagliflozin (INVOKANA) 300 MG TABS tablet Take 300 mg by mouth daily.    . carvedilol (COREG) 12.5 MG tablet Take 1 tablet (12.5 mg total) by mouth 2 (two) times daily. 60 tablet 11  . carvedilol (COREG) 25 MG tablet Take 1 tablet (25 mg total) by mouth 2 (two) times daily. 60 tablet 6  . citalopram (CELEXA) 40 MG tablet Take 1 tablet (40 mg total) by mouth daily. 30 tablet 3  . clopidogrel (PLAVIX) 75 MG tablet Take 1 tablet (75 mg  total) by mouth daily. 30 tablet 5  . insulin aspart (NOVOLOG) 100 UNIT/ML injection Inject 4-15 Units into the skin 3 (three) times daily as needed for high blood sugar. Pt uses per sliding scale.    . insulin detemir (LEVEMIR) 100 UNIT/ML injection Inject 0.25 mLs (25 Units total) into the skin as needed. (Patient taking differently: Inject 25 Units into the skin at bedtime as needed (for high blood sugar). ) 10 mL 1  . isosorbide mononitrate (IMDUR) 30 MG 24 hr tablet Take 1 tablet (30 mg total) by mouth daily. 30 tablet 5  . levothyroxine (SYNTHROID, LEVOTHROID) 25 MCG tablet Take 25 mcg by mouth daily before breakfast.    . metFORMIN (GLUCOPHAGE) 1000 MG tablet Take 1,000 mg by mouth 2 (two) times daily.    . pantoprazole (PROTONIX) 40 MG tablet Take 40 mg by mouth 2 (two) times daily.     . pregabalin (LYRICA) 75 MG capsule Take 1 capsule (75 mg total) by mouth 2 (two) times daily. 28 capsule 0  . promethazine (PHENERGAN) 25 MG tablet Take 25 mg by mouth every 6 (six) hours as needed for nausea or vomiting.    . rosuvastatin (CRESTOR) 20 MG tablet Take 1 tablet (20 mg total) by mouth daily at 6 PM. 30 tablet 0  . temazepam (RESTORIL) 22.5 MG capsule Take 1 capsule (22.5 mg total) by mouth at bedtime. 30 capsule 2   No current facility-administered medications for this visit.    Medical Decision Making:  Established Problem, Worsening (2), Review of Medication Regimen & Side Effects (2) and Review of New Medication or Change in Dosage (2)  Treatment Plan Summary:Medication management and Plan   Major depressive disorder, recurrent, moderate We will continue the patient's citalopram at 40 mg a  day. We will discontinue the ricks salty as patient has arty discontinue this and reports mood has continued to be stable.  Insomnia-increase  temazepam from 15 mg to 22.5 mg at bedtime.  Patient will follow up in 1 month. She's been encouraged call any questions or concerns prior to her next  appointment.       Faith Rogue 12/05/2014, 2:10 PM

## 2014-12-12 ENCOUNTER — Other Ambulatory Visit: Payer: Self-pay

## 2014-12-12 MED ORDER — PREGABALIN 75 MG PO CAPS: 75.0000 mg | ORAL_CAPSULE | Freq: Two times a day (BID) | ORAL | Status: DC

## 2014-12-12 NOTE — Telephone Encounter (Signed)
Please call in Lyrica

## 2014-12-12 NOTE — Telephone Encounter (Signed)
Patient states she was given samples of Lyrica 75mg . She states they have worked somewhat but she still has some burning in her feet. Patient has been out of this medication for 1 week and request more samples, or to have the rx sent into the pharmacy. Patient wants to know if the dosage should be increased.

## 2014-12-13 ENCOUNTER — Telehealth: Payer: Self-pay | Admitting: Family Medicine

## 2014-12-13 NOTE — Telephone Encounter (Signed)
Patient advised of x ray results and that rx has been called into pharmacy.

## 2014-12-13 NOTE — Telephone Encounter (Signed)
Pt states she would like to talk to [facility] about her getting simple's she had talked with her about, and also talk to her about her son's X-Ray [name] DOB [birth year])  to see if the results are back from last week. CC

## 2014-12-13 NOTE — Telephone Encounter (Signed)
Rx called in to pharmacy. 

## 2014-12-18 ENCOUNTER — Ambulatory Visit (INDEPENDENT_AMBULATORY_CARE_PROVIDER_SITE_OTHER): Payer: BC Managed Care – PPO

## 2014-12-18 ENCOUNTER — Other Ambulatory Visit: Payer: Self-pay

## 2014-12-18 DIAGNOSIS — R0602 Shortness of breath: Secondary | ICD-10-CM

## 2015-01-02 ENCOUNTER — Ambulatory Visit: Payer: Self-pay | Admitting: Family Medicine

## 2015-01-03 ENCOUNTER — Ambulatory Visit (INDEPENDENT_AMBULATORY_CARE_PROVIDER_SITE_OTHER): Payer: BC Managed Care – PPO | Admitting: Family Medicine

## 2015-01-03 ENCOUNTER — Encounter: Payer: Self-pay | Admitting: Family Medicine

## 2015-01-03 VITALS — BP 140/100 | HR 87 | Temp 98.7°F | Resp 16 | Ht 64.25 in | Wt 180.0 lb

## 2015-01-03 DIAGNOSIS — Z794 Long term (current) use of insulin: Secondary | ICD-10-CM

## 2015-01-03 DIAGNOSIS — J309 Allergic rhinitis, unspecified: Secondary | ICD-10-CM | POA: Diagnosis not present

## 2015-01-03 DIAGNOSIS — G629 Polyneuropathy, unspecified: Secondary | ICD-10-CM

## 2015-01-03 DIAGNOSIS — R51 Headache: Secondary | ICD-10-CM

## 2015-01-03 DIAGNOSIS — N3000 Acute cystitis without hematuria: Secondary | ICD-10-CM

## 2015-01-03 DIAGNOSIS — R309 Painful micturition, unspecified: Secondary | ICD-10-CM | POA: Diagnosis not present

## 2015-01-03 DIAGNOSIS — R519 Headache, unspecified: Secondary | ICD-10-CM

## 2015-01-03 DIAGNOSIS — I10 Essential (primary) hypertension: Secondary | ICD-10-CM

## 2015-01-03 DIAGNOSIS — E1140 Type 2 diabetes mellitus with diabetic neuropathy, unspecified: Secondary | ICD-10-CM

## 2015-01-03 LAB — POCT URINALYSIS DIPSTICK
Bilirubin, UA: NEGATIVE
Glucose, UA: 2
KETONES UA: NEGATIVE
Nitrite, UA: NEGATIVE
SPEC GRAV UA: 1.015
Urobilinogen, UA: 2
pH, UA: 6

## 2015-01-03 LAB — POCT GLYCOSYLATED HEMOGLOBIN (HGB A1C)
Est. average glucose Bld gHb Est-mCnc: 120
Hemoglobin A1C: 5.8

## 2015-01-03 MED ORDER — PREGABALIN 100 MG PO CAPS: 100.0000 mg | ORAL_CAPSULE | Freq: Two times a day (BID) | ORAL | Status: DC

## 2015-01-03 MED ORDER — SULFAMETHOXAZOLE-TRIMETHOPRIM 800-160 MG PO TABS: 1.0000 | ORAL_TABLET | Freq: Two times a day (BID) | ORAL | Status: DC

## 2015-01-03 NOTE — Progress Notes (Signed)
Patient: [name] Female    DOB: April 21, 1970   44 y.o.   MRN: [id number] Visit Date: 01/03/2015  Today's Provider: [name], MD   Chief Complaint  Patient presents with  . Follow-up  . Diabetes   Subjective:    HPI   Follow-up for neuropathy from 11/21/2014;   Changed from gabapentin which was not working to samples Lyrica 75mg  twice a day initially. Has been working much better than gabapentin. No longer having pins and needs in feet, although it often starts to wear off before next dose. She would like to increase does.     Diabetes Mellitus Type II, Follow-up:   Lab Results  Component Value Date   HGBA1C 5.7 03/20/2014   HGBA1C 6.8* 12/30/2013   HGBA1C 8.5* 01/29/2013    Last seen for diabetes 1 months ago.  Management since then includes no changes. She reports good compliance with treatment. She is not having side effects. none Current symptoms include none and have been stable. Home blood sugar records: fasting range: 248  Episodes of hypoglycemia? no   Current Insulin Regimen: Stopped Levemir several months ago, and rarely requires Novolog Most Recent Eye Exam: due Weight trend: fluctuating a bit Prior visit with dietician: no Current diet: well balanced Current exercise: none  Pertinent Labs:    Component Value Date/Time   CHOL 164 11/09/2014 0454   CHOL 197 12/30/2013 0355   TRIG 259* 11/09/2014 0454   TRIG 261* 12/30/2013 0355   HDL 27* 11/09/2014 0454   HDL 28* 12/30/2013 0355   LDLCALC 85 11/09/2014 0454   LDLCALC 117* 12/30/2013 0355   CREATININE 1.71* 11/23/2014 1453   CREATININE 1.75* 12/30/2013 0355   CREATININE 1.6* 02/03/2013    Wt Readings from Last 3 Encounters:  01/03/15 180 lb (81.647 kg)  12/05/14 179 lb 12.8 oz (81.557 kg)  11/28/14 184 lb 8 oz (83.689 kg)    ------------------------------------------------------------------------  Headaches Started having persistent headaches for the last 12 days. No  specific triggers, although her husband started smoking in the house a few weeks ago . She has also had a lot of clear nasal drainage and congestion. Headaches are on both sides across top of head and in back of neck. Baclofen helps, but makes her sleepy. Excedrin doesn't seem to help at all.   Dysruia Having burning on urination and frequency worsening over the last week or so. Some lower abdominal cramping. A few chills. No fevers.      Allergies  Allergen Reactions  . Tramadol Other (See Comments)    Reaction:  Burning of pts mouth and confusion    Previous Medications   ASPIRIN EC 81 MG TABLET    Take 81 mg by mouth daily.   ASPIRIN-ACETAMINOPHEN-CAFFEINE (EXCEDRIN PO)    Take by mouth as needed.   BACLOFEN (LIORESAL) 10 MG TABLET    Take 10 mg by mouth 3 (three) times daily as needed for muscle spasms.    CANAGLIFLOZIN (INVOKANA) 300 MG TABS TABLET    Take 300 mg by mouth daily.   CARVEDILOL (COREG) 12.5 MG TABLET    Take 1 tablet (12.5 mg total) by mouth 2 (two) times daily.   CARVEDILOL (COREG) 25 MG TABLET    Take 1 tablet (25 mg total) by mouth 2 (two) times daily.   CITALOPRAM (CELEXA) 40 MG TABLET    Take 1 tablet (40 mg total) by mouth daily.   CLOPIDOGREL (PLAVIX) 75 MG TABLET  Take 1 tablet (75 mg total) by mouth daily.   INSULIN ASPART (NOVOLOG) 100 UNIT/ML INJECTION    Inject 4-15 Units into the skin 3 (three) times daily as needed for high blood sugar. Pt uses per sliding scale.   ISOSORBIDE MONONITRATE (IMDUR) 30 MG 24 HR TABLET    Take 1 tablet (30 mg total) by mouth daily.   LEVOTHYROXINE (SYNTHROID, LEVOTHROID) 25 MCG TABLET    Take 25 mcg by mouth daily before breakfast.   METFORMIN (GLUCOPHAGE) 1000 MG TABLET    Take 1,000 mg by mouth 2 (two) times daily.   NAPROXEN SODIUM (ALEVE PO)    Take by mouth as needed.   PANTOPRAZOLE (PROTONIX) 40 MG TABLET    Take 40 mg by mouth 2 (two) times daily.    PROMETHAZINE (PHENERGAN) 25 MG TABLET    Take 25 mg by mouth every 6  (six) hours as needed for nausea or vomiting.   ROSUVASTATIN (CRESTOR) 20 MG TABLET    Take 1 tablet (20 mg total) by mouth daily at 6 PM.   TEMAZEPAM (RESTORIL) 22.5 MG CAPSULE    Take 1 capsule (22.5 mg total) by mouth at bedtime.    Review of Systems  Cardiovascular: Negative for chest pain.  Genitourinary: Positive for dysuria.       Painful urination and strong oder  Neurological: Positive for headaches. Negative for dizziness and light-headedness.    Social History  Substance Use Topics  . Smoking status: Former Smoker    Types: Cigarettes    Quit date: 08/31/1994  . Smokeless tobacco: Never Used     Comment: quit 28 years ago  . Alcohol Use: No   Objective:   BP 140/100 mmHg  Pulse 87  Temp(Src) 98.7 F (37.1 C) (Oral)  Resp 16  Ht 5' 4.25" (1.632 m)  Wt 180 lb (81.647 kg)  BMI 30.65 kg/m2  SpO2 98%  LMP  (Approximate)  Physical Exam   General Appearance:    Alert, cooperative, no distress  HEENT:  Pale boggy turbinates, clear discharge.   Eyes:    PERRL, conjunctiva/corneas clear, EOM's intact       Lungs:     Clear to auscultation bilaterally, respirations unlabored  Heart:    Regular rate and rhythm  Neurologic:   Awake, alert, oriented x 3. No apparent focal neurological           defect.   Abd:   No CVAT     Results for orders placed or performed in visit on 01/03/15  POCT urinalysis dipstick  Result Value Ref Range   Color, UA Yellow    Clarity, UA Cloudy    Glucose, UA 2 or more    Bilirubin, UA Neg    Ketones, UA Neg    Spec Grav, UA 1.015    Blood, UA Moderate    pH, UA 6.0    Protein, UA 100++    Urobilinogen, UA 2.0    Nitrite, UA Neg    Leukocytes, UA moderate (2+) (A) Negative   A1c=5.8    Assessment & Plan:     1. Painful urination  - POCT urinalysis dipstick  2. Type 2 diabetes mellitus with complication, with long-term current use of insulin ([place]) Doing much better since starting Invokana. Continue current medications.     3. Essential hypertension BP somewhat elevated today. She has follow up with Dr. [name] next week who is managing carvedilol.   4. Neuropathy ([place]) Much better since change from  gaba to pregabalin. Will increase from 75 to 100 BID to try to get more prolonged effect - pregabalin (LYRICA) 100 MG capsule; Take 1 capsule (100 mg total) by mouth 2 (two) times daily.  Dispense: 60 capsule; Refill: 5  5. Nonintractable headache, unspecified chronicity pattern, unspecified headache type Likely related to second hand smoke. May also be aggravated by allergies, possibly elevated BP. She is going to start OTC loratadine as below and eliminate second hand smoke exposure  6. Allergic rhinitis, unspecified allergic rhinitis type Loratadine 10mg  daily (OTC)  7. Acute cystitis without hematuria Call if symptoms change or if not rapidly improving.     - Urine culture - sulfamethoxazole-trimethoprim (BACTRIM DS,SEPTRA DS) 800-160 MG tablet; Take 1 tablet by mouth 2 (two) times daily.  Dispense: 14 tablet; Refill: 0   Addressed extensive list of chronic and acute medical problems today requiring extensive time in counseling and coordination care.  Over half of this 45 minute visit were spent in counseling and coordinating care of multiple medical problems.  Follow up 4 months.       [name], MD  [facility]

## 2015-01-03 NOTE — Patient Instructions (Signed)
Take OTC loratadine (Claritin) 10mg  daily for allerigies

## 2015-01-05 LAB — URINE CULTURE

## 2015-01-07 ENCOUNTER — Encounter: Payer: Self-pay | Admitting: Cardiovascular Disease

## 2015-01-07 ENCOUNTER — Ambulatory Visit (INDEPENDENT_AMBULATORY_CARE_PROVIDER_SITE_OTHER): Payer: BC Managed Care – PPO | Admitting: Cardiovascular Disease

## 2015-01-07 ENCOUNTER — Other Ambulatory Visit: Payer: Self-pay | Admitting: Family Medicine

## 2015-01-07 ENCOUNTER — Ambulatory Visit (INDEPENDENT_AMBULATORY_CARE_PROVIDER_SITE_OTHER): Payer: BC Managed Care – PPO | Admitting: Psychiatry

## 2015-01-07 ENCOUNTER — Ambulatory Visit: Payer: Self-pay | Admitting: Psychiatry

## 2015-01-07 ENCOUNTER — Encounter: Payer: Self-pay | Admitting: Psychiatry

## 2015-01-07 VITALS — BP 160/90 | HR 79 | Ht 63.0 in | Wt 180.5 lb

## 2015-01-07 VITALS — BP 172/102 | HR 94 | Temp 98.6°F | Ht 63.0 in | Wt 183.6 lb

## 2015-01-07 DIAGNOSIS — I209 Angina pectoris, unspecified: Secondary | ICD-10-CM

## 2015-01-07 DIAGNOSIS — R079 Chest pain, unspecified: Secondary | ICD-10-CM | POA: Diagnosis not present

## 2015-01-07 DIAGNOSIS — I5022 Chronic systolic (congestive) heart failure: Secondary | ICD-10-CM

## 2015-01-07 DIAGNOSIS — IMO0002 Reserved for concepts with insufficient information to code with codable children: Secondary | ICD-10-CM

## 2015-01-07 DIAGNOSIS — E1165 Type 2 diabetes mellitus with hyperglycemia: Secondary | ICD-10-CM

## 2015-01-07 DIAGNOSIS — F331 Major depressive disorder, recurrent, moderate: Secondary | ICD-10-CM | POA: Diagnosis not present

## 2015-01-07 DIAGNOSIS — I25111 Atherosclerotic heart disease of native coronary artery with angina pectoris with documented spasm: Secondary | ICD-10-CM

## 2015-01-07 DIAGNOSIS — Z955 Presence of coronary angioplasty implant and graft: Secondary | ICD-10-CM

## 2015-01-07 DIAGNOSIS — E118 Type 2 diabetes mellitus with unspecified complications: Secondary | ICD-10-CM

## 2015-01-07 DIAGNOSIS — E785 Hyperlipidemia, unspecified: Secondary | ICD-10-CM

## 2015-01-07 DIAGNOSIS — R000 Tachycardia, unspecified: Secondary | ICD-10-CM

## 2015-01-07 DIAGNOSIS — I10 Essential (primary) hypertension: Secondary | ICD-10-CM

## 2015-01-07 DIAGNOSIS — N183 Chronic kidney disease, stage 3 unspecified: Secondary | ICD-10-CM

## 2015-01-07 MED ORDER — LITHIUM CARBONATE ER 300 MG PO TBCR: 300.0000 mg | EXTENDED_RELEASE_TABLET | Freq: Two times a day (BID) | ORAL | Status: DC

## 2015-01-07 MED ORDER — DILTIAZEM HCL ER COATED BEADS 120 MG PO CP24: 120.0000 mg | ORAL_CAPSULE | Freq: Every day | ORAL | Status: DC

## 2015-01-07 MED ORDER — FLUCONAZOLE 150 MG PO TABS: 150.0000 mg | ORAL_TABLET | Freq: Once | ORAL | Status: DC

## 2015-01-07 NOTE — Patient Instructions (Signed)
Have lithium labs done 01/11/15 . Take morning does of lithium after the lab is drawn.

## 2015-01-07 NOTE — Assessment & Plan Note (Signed)
Currently with no symptoms of angina. No further workup at this time. Continue current medication regimen. Encouraged her to stay on her aspirin and Plavix

## 2015-01-07 NOTE — Assessment & Plan Note (Signed)
30 day monitor reviewed with her in detail. Given her blood pressure is elevated and she continues to have symptoms from her tachycardia, we will add diltiazem 120 mg daily Encouraged her to monitor her heart rate and blood pressure. If this continues to run high, could increase diltiazem up to 180 mg daily or 120 mg twice a day We did discuss sending propranolol as needed. We will wait for now

## 2015-01-07 NOTE — Patient Instructions (Addendum)
You are doing well.  Please start cardizem 120 mg daily  Ask Dr. Holley Raring about ACE inh, or ARB for blood pressure and kidney  Please monitor BP at home  Please call us if you have new issues that need to be addressed before your next appt.  Your physician wants you to follow-up in: 6 months.  You will receive a reminder letter in the mail two months in advance. If you don't receive a letter, please call our office to schedule the follow-up appointment.

## 2015-01-07 NOTE — Assessment & Plan Note (Signed)
Encouraged her to stay on her Crestor. Goal LDL less than 70

## 2015-01-07 NOTE — Assessment & Plan Note (Signed)
Recommended she talk with nephrology to consider starting ACE inhibitor or ARB

## 2015-01-07 NOTE — Assessment & Plan Note (Signed)
Angina pectoris has improved after stent placement October 2016 Continue current medication as above

## 2015-01-07 NOTE — Telephone Encounter (Signed)
rx sent to [facility]

## 2015-01-07 NOTE — Progress Notes (Signed)
Patient ID: [name], female    DOB: 05-24-1970, 44 y.o.   MRN: [id number]  HPI Comments: 44 year old woman with a long hx of smoking, numerous prior strokes,  history of chest pain dating back to 2005 at that time with cardiac catheterization on August 09, 2003 found to have mild plaque in the LAD, but there was no obstructive disease noted.   MUGA, which showed an ejection fraction of 51%.   Transesophageal echocardiogram in the setting of a CVA in May 2010  showed normal LV function, s/p gastric bypass surgery, Who presents for routine followup of her tachycardia and chest pain  She is status post left carotid to subclavian artery bypass with subclavian artery ligation, done at [facility] by vascular surgery admission to [facility] from 10/5 to 11/13/14 with unstable angina and was found to have severe mid LAD lesion at 85-90% stenosis on diagnositc cardiac cath s/p PCI/DES on staged procedure.   In follow-up today, she reports periods of tachycardia These can come on at rest, even with sitting 30 day monitor reviewed with her showing normal sinus rhythm with episodes of now complex tachycardia with rate greater than 100 bpm  She denies any significant chest pain. She is concerned as her blood pressure has been running high at home Typically systolic pressure [id number] or more She has been compliant on her medications. These were reviewed with her  EKG on today's visit shows normal sinus rhythm with rate 79 bpm, no significant ST changes, there is T-wave abnormality in inferior leads  Other past medical history CT in October 2015 showing Right aortic arch with a large retro- esophageal vascular segment, persistent dorsal aortic segment) giving off aberrant left subclavian artery. This large caliber retroaortic segment measuring 18 mm compared to the ascending aorta which measures 22 mm. This large retroesophageal aortic segment along with the partially calcified atretic ductus ligament between the  left aberrant subclavian artery and the left pulmonary artery creates an ALMOSTcomplete vascular ring. This is likelythe cause ofpatient's dysphagia [place].  Previously was taking midodrine or Florinef for hypotension following her stroke.  She previously presented to the hospital December 18, 2010 with chest pain.   stress test in the hospital and showed no ischemia. Cardiac enzymes were negative. No EKG changes noted.   admission to the hospital 11/01/2012 with discharge 11/02/2012 with chest pain. She ruled out with negative cardiac enzymes. Blood pressures were in the [id number] systolic range. Discharge blood pressure medications included amlodipine 10 mg daily, benazepril 40 mg daily, clonidine 0.2 mg., Coreg 3.125 mg twice a day  stroke and admission to the hospital at the end of December 2014. She had acute onset left sided weakness including arm and leg. She reports that review of MRI showed acute stroke, as well as previous strokes. She was seen by neurology and aspirin changed to Plavix 75 mg daily. Hemoglobin A1c 8.5. Notes indicate short run of SVT while she was in the hospital and on the monitor.  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  Echocardiogram 01/29/2013 suggest ejection fraction 40-45% CT scan of the head showing small vessel disease MRI showing acute infarct right parietal white matter, extensive chronic microvascular ischemic changes  Echocardiogram April 2014 showed ejection fraction 50-55%, otherwise normal study Stress test 11/02/2012 showing no ischemia,   Allergies  Allergen Reactions  . Tramadol Other (See Comments)    Reaction:  Burning of pts mouth  and confusion     Outpatient Encounter Prescriptions as of 01/07/2015  Medication Sig  . aspirin EC 81 MG tablet Take 81 mg by mouth daily.  . Aspirin-Acetaminophen-Caffeine (EXCEDRIN PO) Take by mouth as needed.   . baclofen (LIORESAL) 10 MG tablet Take 10 mg by mouth 3 (three) times daily as needed for muscle spasms.   . canagliflozin (INVOKANA) 300 MG TABS tablet Take 300 mg by mouth daily.  . carvedilol (COREG) 25 MG tablet Take 1 tablet (25 mg total) by mouth 2 (two) times daily.  . citalopram (CELEXA) 40 MG tablet Take 1 tablet (40 mg total) by mouth daily.  . clopidogrel (PLAVIX) 75 MG tablet Take 1 tablet (75 mg total) by mouth daily.  . ferrous sulfate 325 (65 FE) MG tablet Take 325 mg by mouth daily with breakfast.  . isosorbide mononitrate (IMDUR) 30 MG 24 hr tablet Take 1 tablet (30 mg total) by mouth daily.  Marland Kitchen levothyroxine (SYNTHROID, LEVOTHROID) 25 MCG tablet Take 25 mcg by mouth daily before breakfast.  . metFORMIN (GLUCOPHAGE) 1000 MG tablet Take 1,000 mg by mouth 2 (two) times daily.  . Naproxen Sodium (ALEVE PO) Take by mouth as needed.  . pantoprazole (PROTONIX) 40 MG tablet Take 40 mg by mouth 2 (two) times daily.   . pregabalin (LYRICA) 100 MG capsule Take 1 capsule (100 mg total) by mouth 2 (two) times daily.  . promethazine (PHENERGAN) 25 MG tablet Take 25 mg by mouth every 6 (six) hours as needed for nausea or vomiting.  . rosuvastatin (CRESTOR) 20 MG tablet Take 1 tablet (20 mg total) by mouth daily at 6 PM.  . sulfamethoxazole-trimethoprim (BACTRIM DS,SEPTRA DS) 800-160 MG tablet Take 1 tablet by mouth 2 (two) times daily.  . temazepam (RESTORIL) 22.5 MG capsule Take 1 capsule (22.5 mg total) by mouth at bedtime.  Marland Kitchen diltiazem (CARDIZEM CD) 120 MG 24 hr capsule Take 1 capsule (120 mg total) by mouth daily.  . [DISCONTINUED] carvedilol (COREG) 12.5 MG tablet Take 1 tablet (12.5 mg total) by mouth 2 (two) times daily. (Patient not taking: Reported on 01/07/2015)  . [DISCONTINUED] insulin aspart (NOVOLOG) 100 UNIT/ML injection Inject 4-15 Units into the skin 3 (three) times daily as needed for high blood sugar. Pt uses per sliding scale.   No facility-administered encounter  medications on file as of 01/07/2015.    Past Medical History  Diagnosis Date  . Colon polyp   . GERD (gastroesophageal reflux disease)   . CAD (coronary artery disease)     a. cath 08/09/2003: mild LAD plaque, no obs dzs noted; b. 12/18/10 stress test w/o ischemia; c. cath 11/2014: LM: nl, mid 85-90% s/p PCI/DES, mid LCX 30%, RCA nl  . CVA (cerebral vascular accident) ([place])     a. 06/2008-TEE nl LV fxn; b. 01/2013 - notes indicate short run of SVT at that time  . Multiple sclerosis ([place]) 2015  . Syncope   . Chronic systolic CHF (congestive heart failure) ([place])     a. echo 01/29/2013 EF 40-45%, basal anteroseptal & mid anteroseptal segments abnl; b echo 05/2012 EF 50-55%, mildly dilated LA, nl RVSP  . Orthostatic hypotension   . Aortic arch aneurysm ([place])   . Ischemic cardiomyopathy   . MS (mitral stenosis)   . Malignant melanoma of skin of scalp ([place])   . Hypertension   . Depression   . Diabetes mellitus without complication ([place])   . Headache     Migrains  . Hyperlipidemia   .  CKD (chronic kidney disease), stage IV ([place])   . Hypothyroidism   . Anxiety   . Gastric ulcer 04/27/2011  . Heart attack [facility])     Past Surgical History  Procedure Laterality Date  . Appendectomy    . Right oophorectomy    . Tubal ligation      R tube removed still w/ Left  . Dilation and curettage of uterus    . Gastric bypass  09/2009    [facility]   . Esophagogastroduodenoscopy (egd) with propofol N/A 09/14/2014    Procedure: ESOPHAGOGASTRODUODENOSCOPY (EGD) WITH PROPOFOL;  Surgeon: Josefine Class, MD;  Location: [facility] ENDOSCOPY;  Service: Endoscopy;  Laterality: N/A;  . Cardiac catheterization N/A 11/09/2014    Procedure: Coronary Angiography;  Surgeon: Minna Merritts, MD;  Location: [place] CV LAB;  Service: Cardiovascular;  Laterality: N/A;  . Cardiac catheterization N/A 11/12/2014    Procedure: Coronary Stent Intervention;  Surgeon: Isaias Cowman, MD;  Location:  [place] CV LAB;  Service: Cardiovascular;  Laterality: N/A;  . Trigger finger release    . Melanoma excision  2016    Dr. Evorn Gong    Social History  reports that she quit smoking about 20 years ago. Her smoking use included Cigarettes. She has never used smokeless tobacco. She reports that she does not drink alcohol or use illicit drugs.  Family History family history includes Anxiety disorder in her father and mother; Bipolar disorder in her mother; Colon cancer in her father; Depression in her father, mother, and sister; Diabetes in her father and sister; Heart disease in her father and mother; Hyperlipidemia in her mother and sister; Hypertension in her father, mother, sister, and sister; Kidney disease in her father and sister; Skin cancer in her father; Stroke in her father; Thyroid nodules in her sister.    Review of Systems  Constitutional: Positive for fatigue.  Respiratory: Negative.   Cardiovascular: Positive for palpitations.  Gastrointestinal: Negative.   Musculoskeletal: Negative.   Neurological: Negative.        Left arm, left leg weakness  Hematological: Negative.   All other systems reviewed and are negative.   BP 160/90 mmHg  Pulse 79  Ht 5\' 3"  (1.6 m)  Wt 180 lb 8 oz (81.874 kg)  BMI 31.98 kg/m2  LMP  (Approximate)  Physical Exam  Constitutional: She is oriented to person, place, and time. She appears well-developed and well-nourished.  HENT:  Head: Normocephalic.  Nose: Nose normal.  Mouth/Throat: Oropharynx is clear and moist.  Eyes: Conjunctivae are normal. Pupils are equal, round, and reactive to light.  Neck: Normal range of motion. Neck supple. No JVD present.  Cardiovascular: Normal rate, regular rhythm, S1 normal, S2 normal, normal heart sounds and intact distal pulses.  Exam reveals no gallop and no friction rub.   No murmur heard. Pulmonary/Chest: Effort normal and breath sounds normal. No respiratory distress. She has no wheezes. She has no  rales. She exhibits no tenderness.  Abdominal: Soft. Bowel sounds are normal. She exhibits no distension. There is no tenderness.  Musculoskeletal: Normal range of motion. She exhibits no edema or tenderness.  Lymphadenopathy:    She has no cervical adenopathy.  Neurological: She is alert and oriented to person, place, and time. Coordination normal.  Skin: Skin is warm and dry. No rash noted. No erythema.  Psychiatric: She has a normal mood and affect. Her behavior is normal. Judgment and thought content normal.    Assessment and Plan  Nursing note  and vitals reviewed.

## 2015-01-07 NOTE — Telephone Encounter (Signed)
Pt stated that she thinks the antibiotic that she started on 01/03/15 has caused a yeast infection and she would like something in the pill form to treat to [place]. Please advise. Thanks TNP

## 2015-01-07 NOTE — Progress Notes (Signed)
BH MD/PA/NP OP Progress Note  01/07/2015 2:47 PM [place]  MRN:  [id number]  Subjective:  Patient returns for follow-up of her major depressive disorder. Patient states that a big issues right now are anxiety and irritability. She states that she snapping at people quickly. She states that after she stopped the ricks salty her husband felt that her mood was much better however since her last visit he stated the old irritable patient has returned. We had previously discussed trials of Abilify and Seroquel however the co-pay was a concern to patient. I did discuss some of the other mood stabilizers that might have a lower co-pay such as Depakote and lithium. Given that this irritability seems to have persisted and be a common theme since the patient's initial presentation she may have depressive disorder with mixed features.  She states that sleep was under control with the Restoril and she found the increase from 15 mg to 22.5 mg helpful. Chief Complaint: been a rough past two months  Visit Diagnosis:   No diagnosis found.  Past Medical History:  Past Medical History  Diagnosis Date  . Colon polyp   . GERD (gastroesophageal reflux disease)   . CAD (coronary artery disease)     a. cath 08/09/2003: mild LAD plaque, no obs dzs noted; b. 12/18/10 stress test w/o ischemia; c. cath 11/2014: LM: nl, mid 85-90% s/p PCI/DES, mid LCX 30%, RCA nl  . CVA (cerebral vascular accident) ([place])     a. 06/2008-TEE nl LV fxn; b. 01/2013 - notes indicate short run of SVT at that time  . Multiple sclerosis ([place]) 2015  . Syncope   . Chronic systolic CHF (congestive heart failure) ([place])     a. echo 01/29/2013 EF 40-45%, basal anteroseptal & mid anteroseptal segments abnl; b echo 05/2012 EF 50-55%, mildly dilated LA, nl RVSP  . Orthostatic hypotension   . Aortic arch aneurysm ([place])   . Ischemic cardiomyopathy   . MS (mitral stenosis)   . Malignant melanoma of skin of scalp ([place])   . Hypertension   .  Depression   . Diabetes mellitus without complication ([place])   . Headache     Migrains  . Hyperlipidemia   . CKD (chronic kidney disease), stage IV ([place])   . Hypothyroidism   . Anxiety   . Gastric ulcer 04/27/2011  . Heart attack [facility])     Past Surgical History  Procedure Laterality Date  . Appendectomy    . Right oophorectomy    . Tubal ligation      R tube removed still w/ Left  . Dilation and curettage of uterus    . Gastric bypass  09/2009    [facility]   . Esophagogastroduodenoscopy (egd) with propofol N/A 09/14/2014    Procedure: ESOPHAGOGASTRODUODENOSCOPY (EGD) WITH PROPOFOL;  Surgeon: Josefine Class, MD;  Location: [facility] ENDOSCOPY;  Service: Endoscopy;  Laterality: N/A;  . Cardiac catheterization N/A 11/09/2014    Procedure: Coronary Angiography;  Surgeon: Minna Merritts, MD;  Location: [place] CV LAB;  Service: Cardiovascular;  Laterality: N/A;  . Cardiac catheterization N/A 11/12/2014    Procedure: Coronary Stent Intervention;  Surgeon: Isaias Cowman, MD;  Location: [place] CV LAB;  Service: Cardiovascular;  Laterality: N/A;  . Trigger finger release    . Melanoma excision  2016    Dr. Evorn Gong   Family History:  Family History  Problem Relation Age of Onset  . Hypertension Mother   . Anxiety disorder  Mother   . Depression Mother   . Bipolar disorder Mother   . Heart disease Mother   . Hyperlipidemia Mother   . Kidney disease Father   . Heart disease Father   . Hypertension Father   . Diabetes Father   . Stroke Father   . Colon cancer Father     dx in his 38's  . Anxiety disorder Father   . Depression Father   . Skin cancer Father   . Kidney disease Sister   . Thyroid nodules Sister   . Hypertension Sister   . Hypertension Sister   . Diabetes Sister   . Hyperlipidemia Sister   . Depression Sister    Social History:  Social History   Social History  . Marital Status: Married    Spouse Name: N/A  . Number of  Children: 1  . Years of Education: N/A   Occupational History  . 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 Disabled through 08/02/2013 per forms 02/03/2013   Social History Main Topics  . Smoking status: Former Smoker    Types: Cigarettes    Quit date: 08/31/1994  . Smokeless tobacco: Never Used     Comment: quit 28 years ago  . Alcohol Use: No  . Drug Use: No  . Sexual Activity: Not Currently    Birth Control/ Protection: None   Other Topics Concern  . Not on file   Social History Narrative   previously did custolial work. 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 Disabled through 08/02/2013 per forms 02/03/13   Additional History:   Assessment:   Musculoskeletal: Strength & Muscle Tone: within normal limits Gait & Station: normal Patient leans: N/A  Psychiatric Specialty Exam: Depression        Associated symptoms include insomnia (improved with temazepam).  Associated symptoms include no suicidal ideas.  Past medical history includes anxiety.   Anxiety Symptoms include insomnia (improved with temazepam). Patient reports no nervous/anxious behavior or suicidal ideas.      Review of Systems  Psychiatric/Behavioral: Negative for depression, suicidal ideas, hallucinations, memory loss and substance abuse. The patient has insomnia (improved with temazepam). The patient is not nervous/anxious.        Patient describes more of a baseline level of irritability  All other systems reviewed and are negative.   There were no vitals taken for this visit.There is no weight on file to calculate BMI.  General Appearance: Neat and Well Groomed  Eye Contact:  Good  Speech:  Normal Rate  Volume:  Normal  Mood:  Okay  Affect:  Appropriate and able to smile despite her ongoing health issues  Thought Process:  Linear and Logical  Orientation:  Full (Time, Place, and Person)  Thought Content:   Negative  Suicidal Thoughts:  No  Homicidal Thoughts:  No  Memory:  Immediate;   Good Recent;   Good Remote;   Good  Judgement:  Good  Insight:  Good  Psychomotor Activity:  Negative  Concentration:  Good  Recall:  Good  Fund of Knowledge: Good  Language: Good  Akathisia:  Negative  Handed:  Right  AIMS (if indicated):  Done on 3/16 and was normal  Assets:  Communication Skills Desire for Improvement Social Support  ADL's:  Intact  Cognition: WNL  Sleep:  At times too much   Is the patient at risk to self?  No. Has the patient been a risk to self in the past 6 months?  No. Has the patient been a risk to self within the distant past?  Yes.   November 2015 Is the patient a risk to others?  No. Has the patient been a risk to others in the past 6 months?  No. Has the patient been a risk to others within the distant past?  No.  Current Medications: Current Outpatient Prescriptions  Medication Sig Dispense Refill  . aspirin EC 81 MG tablet Take 81 mg by mouth daily.    . Aspirin-Acetaminophen-Caffeine (EXCEDRIN PO) Take by mouth as needed.    . baclofen (LIORESAL) 10 MG tablet Take 10 mg by mouth 3 (three) times daily as needed for muscle spasms.     . canagliflozin (INVOKANA) 300 MG TABS tablet Take 300 mg by mouth daily.    . carvedilol (COREG) 25 MG tablet Take 1 tablet (25 mg total) by mouth 2 (two) times daily. 60 tablet 6  . citalopram (CELEXA) 40 MG tablet Take 1 tablet (40 mg total) by mouth daily. 30 tablet 3  . clopidogrel (PLAVIX) 75 MG tablet Take 1 tablet (75 mg total) by mouth daily. 30 tablet 5  . diltiazem (CARDIZEM CD) 120 MG 24 hr capsule Take 1 capsule (120 mg total) by mouth daily. 30 capsule 11  . ferrous sulfate 325 (65 FE) MG tablet Take 325 mg by mouth daily with breakfast.    . isosorbide mononitrate (IMDUR) 30 MG 24 hr tablet Take 1 tablet (30 mg total) by mouth daily. 30 tablet 5  . levothyroxine (SYNTHROID, LEVOTHROID) 25 MCG tablet Take 25 mcg by  mouth daily before breakfast.    . lithium carbonate (LITHOBID) 300 MG CR tablet Take 1 tablet (300 mg total) by mouth 2 (two) times daily. 60 tablet 1  . metFORMIN (GLUCOPHAGE) 1000 MG tablet Take 1,000 mg by mouth 2 (two) times daily.    . Naproxen Sodium (ALEVE PO) Take by mouth as needed.    . pantoprazole (PROTONIX) 40 MG tablet Take 40 mg by mouth 2 (two) times daily.     . pregabalin (LYRICA) 100 MG capsule Take 1 capsule (100 mg total) by mouth 2 (two) times daily. 60 capsule 5  . promethazine (PHENERGAN) 25 MG tablet Take 25 mg by mouth every 6 (six) hours as needed for nausea or vomiting.    . rosuvastatin (CRESTOR) 20 MG tablet Take 1 tablet (20 mg total) by mouth daily at 6 PM. 30 tablet 0  . sulfamethoxazole-trimethoprim (BACTRIM DS,SEPTRA DS) 800-160 MG tablet Take 1 tablet by mouth 2 (two) times daily. 14 tablet 0  . temazepam (RESTORIL) 22.5 MG capsule Take 1 capsule (22.5 mg total) by mouth at bedtime. 30 capsule 2   No current facility-administered medications for this visit.    Medical Decision Making:  Established Problem, Worsening (2), Review of Medication Regimen & Side Effects (2) and Review of New Medication or Change in Dosage (2)  Treatment Plan Summary:Medication management and Plan   Major depressive disorder, recurrent, moderate We will continue the patient's citalopram at 40 mg a day. Patient may have a major depressive disorder with mixed features as she is continue to talk about the theme of irritability, low frustration tolerance. We have previously discussed use of Seroquel or Abilify that might help with depression and also provide mood stabilization for any mixed features. However patient has expressed concerns with the co-pays for these medications. I did discuss that  there are other mood stabilizers that are less expensive as alternatives. Patient states she is interested in perhaps trials of lithium or Depakote. Risk and benefits of lithium of been discussed  and patient is able to consent. We'll start lithium 300 mg twice a day to target any mixed features as well as perhaps augment for depression. I have given her a laboratory slip to have lithium levels and associated labs performed.  Insomnia-continue temazepam 22.5 mg at bedtime.  Patient will follow up in 1 month. She's been encouraged call any questions or concerns prior to her next appointment.       Faith Rogue 01/07/2015, 2:47 PM

## 2015-01-07 NOTE — Assessment & Plan Note (Signed)
We have encouraged continued exercise, careful diet management in an effort to lose weight. 

## 2015-01-07 NOTE — Assessment & Plan Note (Signed)
Currently on aspirin and Plavix. Was stay on this for least one year

## 2015-01-08 NOTE — Telephone Encounter (Signed)
Patient notified

## 2015-01-10 ENCOUNTER — Other Ambulatory Visit: Payer: Self-pay | Admitting: Family Medicine

## 2015-01-11 ENCOUNTER — Other Ambulatory Visit: Payer: Self-pay | Admitting: Psychiatry

## 2015-01-11 IMAGING — CR DG CHEST 1V PORT
1 series · 1 of 1 positions shown · non-contrast
Comparison: February 10, 2012

CLINICAL DATA: Left-sided numbness

EXAM:
PORTABLE CHEST - 1 VIEW

[ap]
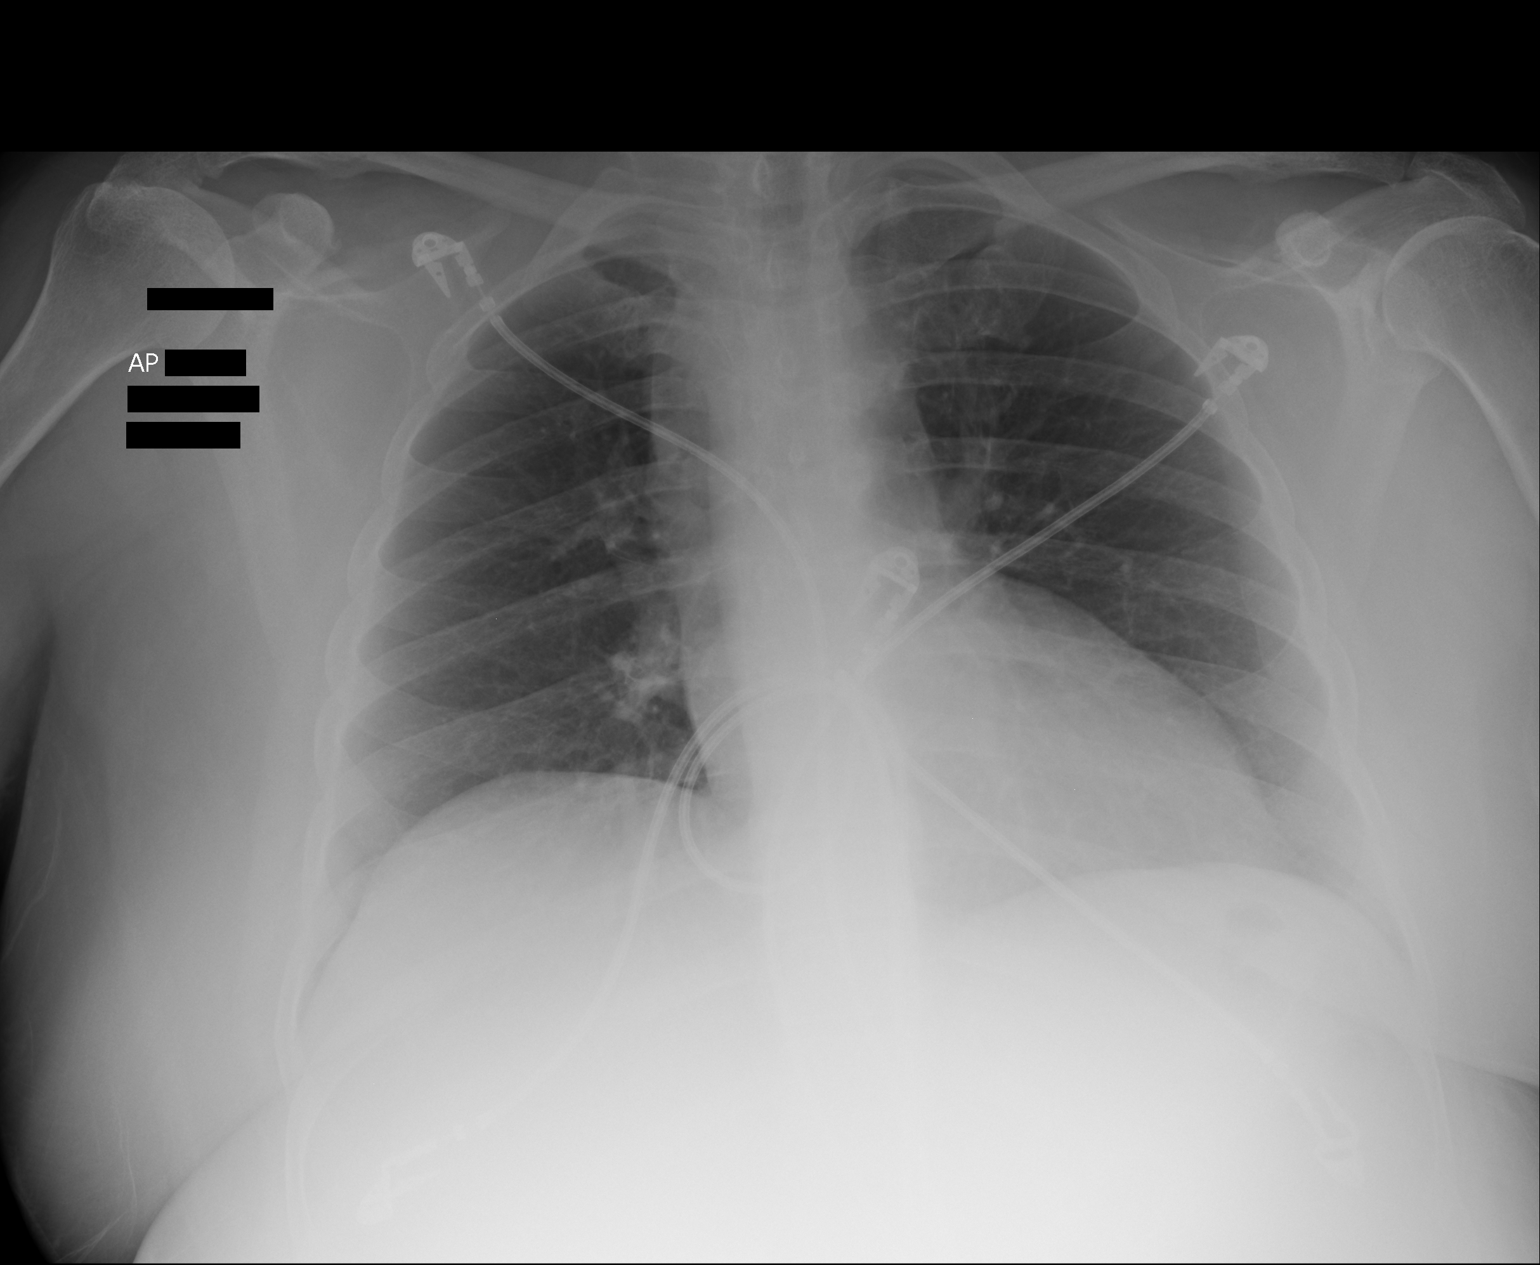

[1 of 1 positions shown; findings below may reference images not displayed]

FINDINGS: Lungs are clear. Heart size and pulmonary vascularity are normal. No
adenopathy. No pneumothorax. No bone lesions.
IMPRESSION: No abnormality noted.

## 2015-01-11 IMAGING — CT CT HEAD WITHOUT CONTRAST
2 of 3 series · 16 of 30 positions shown, 18 images · non-contrast
Comparison: May 25, 2012

CLINICAL DATA: Left-sided weakness

EXAM:
CT HEAD WITHOUT CONTRAST
TECHNIQUE: Contiguous axial images were obtained from the base of the skull
through the vertex without intravenous contrast. Study was obtained
within 24 hr of patient's arrival at the emergency department.

[Series 2: soft tissue · axial · 0.40mm/px · z∈[-148,-42]mm · 8 of 29 slices shown, 10 images]
[im 4/29  brain]
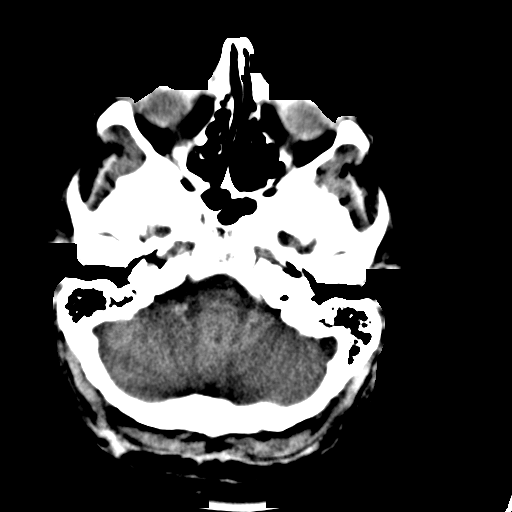
[im 4/29  bone]
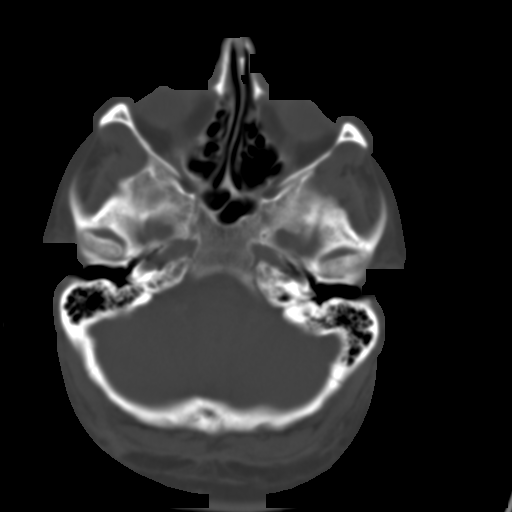
[im 7/29  brain]
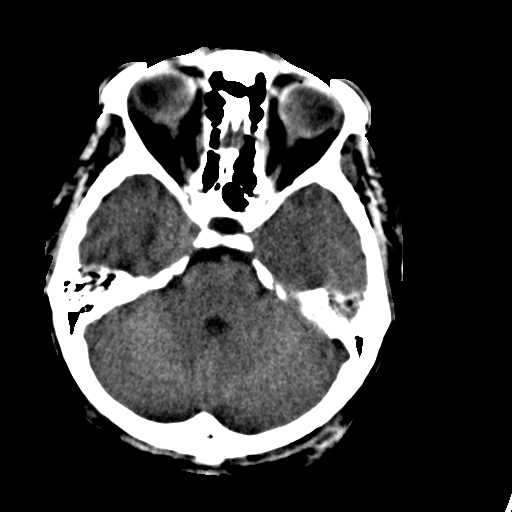
[im 10/29  brain]
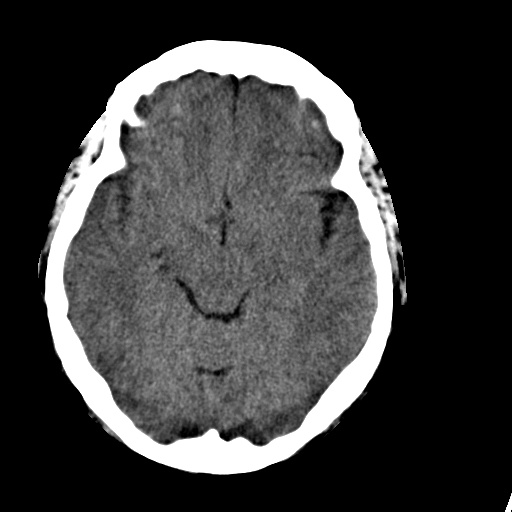
[im 13/29  brain]
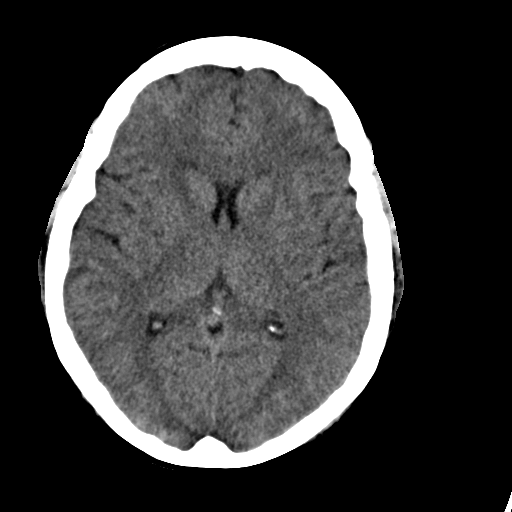
[im 16/29  brain]
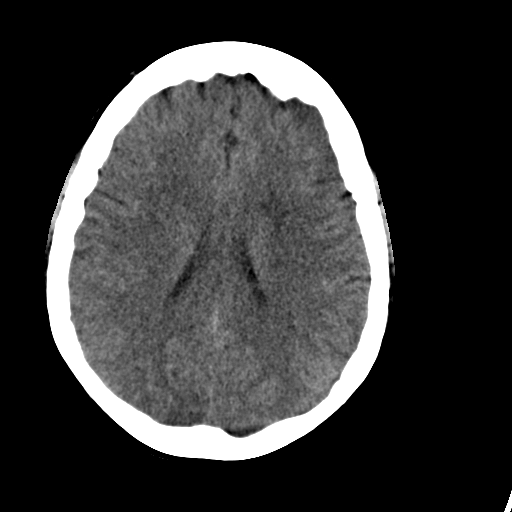
[im 16/29  bone]
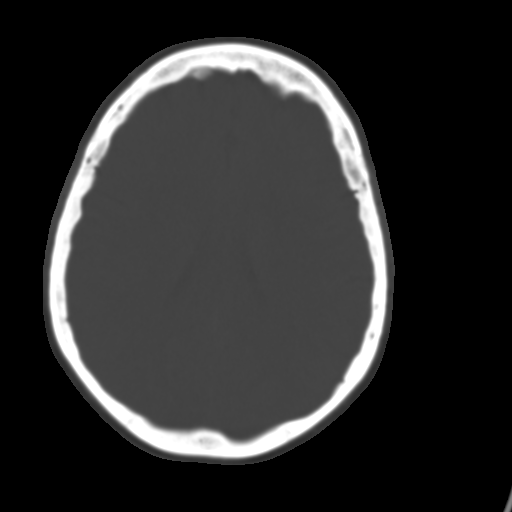
[im 19/29  brain]
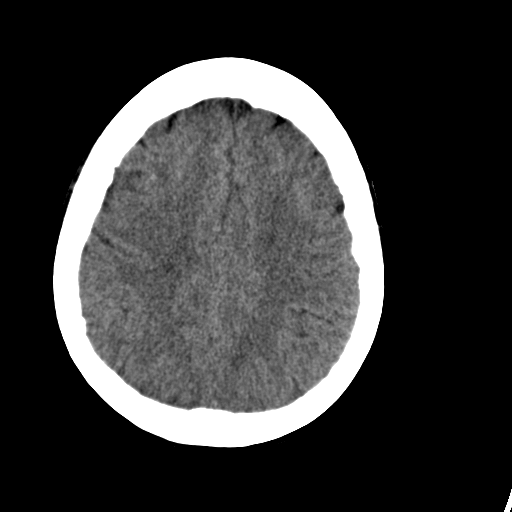
[im 22/29  brain]
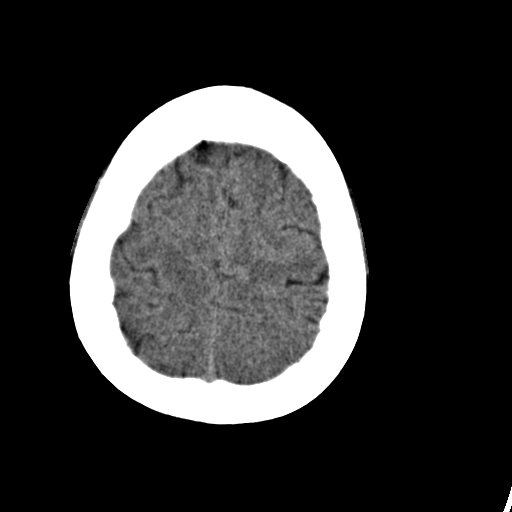
[im 25/29  brain]
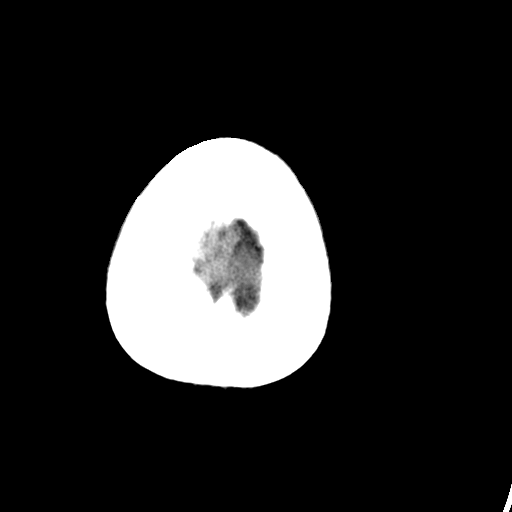

[Series 4: soft tissue recon · axial · 0.40mm/px · z∈[-162,-58]mm · 8 of 29 slices shown]
[im 4/29  brain]
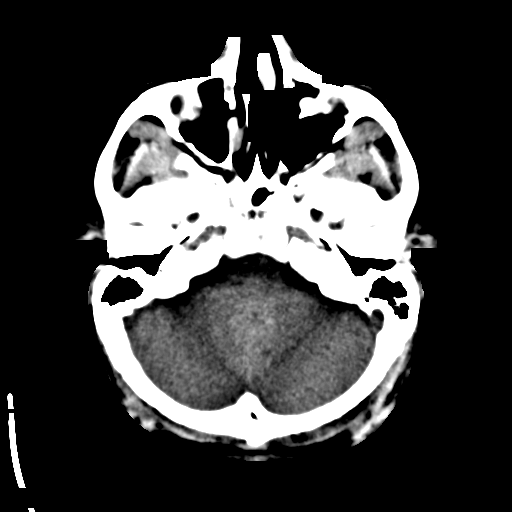
[im 7/29  brain]
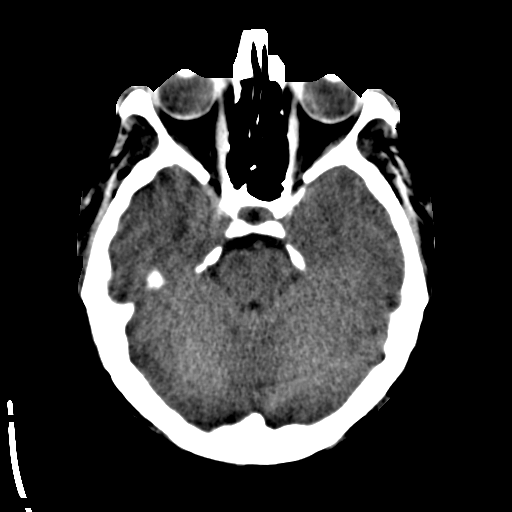
[im 10/29  brain]
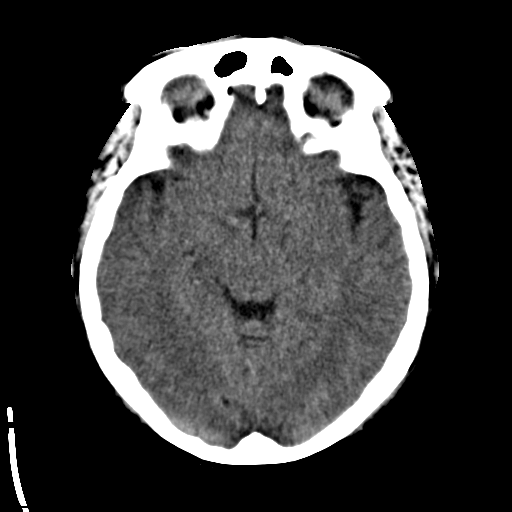
[im 13/29  brain]
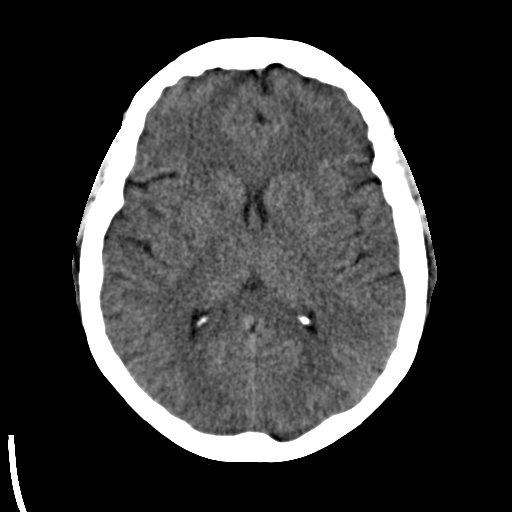
[im 16/29  brain]
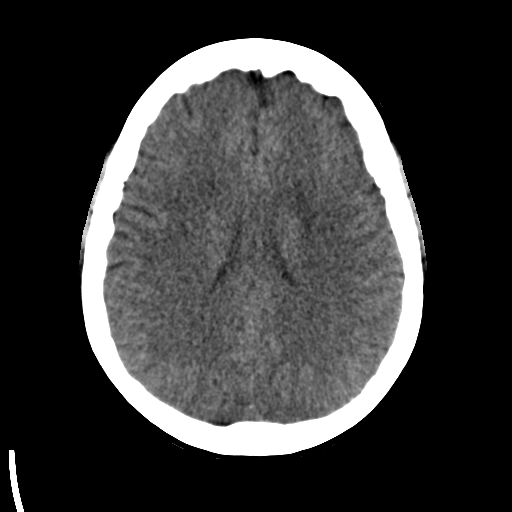
[im 19/29  brain]
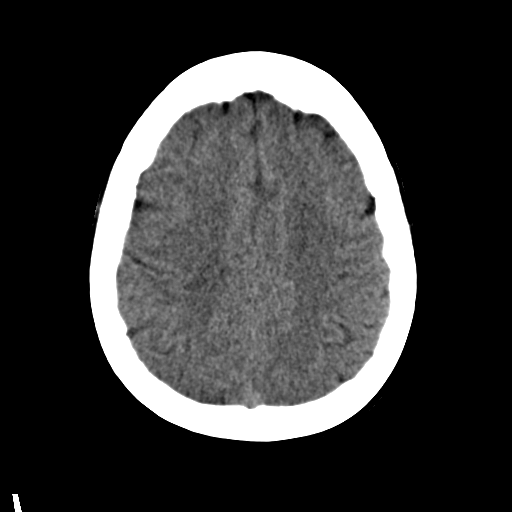
[im 22/29  brain]
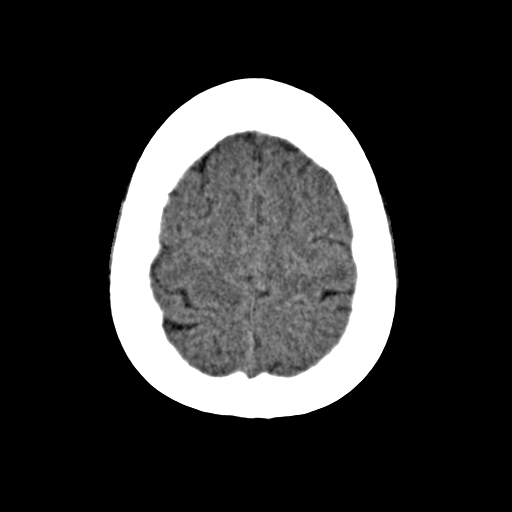
[im 25/29  brain]
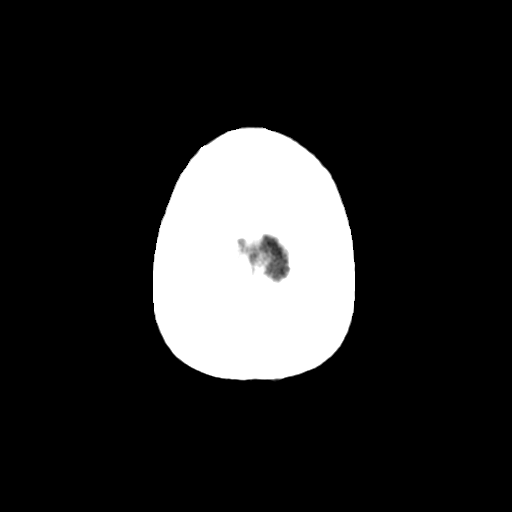

[16 of 30 positions shown; findings below may reference images not displayed]

FINDINGS: The ventricles are normal in size and configuration. There is no
mass, hemorrhage, extra-axial fluid collection, or midline shift.
There is mild patchy small vessel disease in the centra semiovale
bilaterally. There is no new apparent gray-white compartment lesion.
No acute infarct is demonstrable on this study.

Bony calvarium appears intact. The mastoid air cells are clear.
There is minimal mucosal thickening in the right maxillary antrum.
IMPRESSION: The mild small vessel disease. No demonstrable intracranial mass,
hemorrhage, or acute infarct. Mild right-sided maxillary sinus
disease.

## 2015-01-11 IMAGING — MR MRI HEAD WITHOUT CONTRAST
9 series · 43 of 48 positions shown · non-contrast
Comparison: CT today.  MRI 05/24/2012

CLINICAL DATA: Stroke.  Left-sided weakness

EXAM:
MRI HEAD WITHOUT CONTRAST
TECHNIQUE: Multiplanar, multiecho pulse sequences of the brain and surrounding
structures were obtained without intravenous contrast.

[Series 2: T1 · sagittal · 5.0mm · 0.45mm/px · 4 of 27 slices shown (1 of 2)]
[im 1/27]
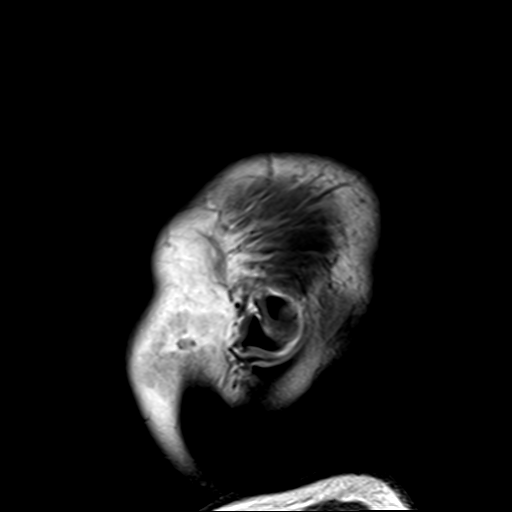
[im 9/27]
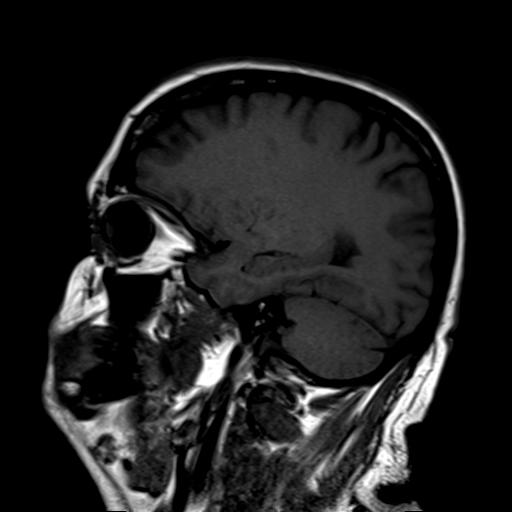
[im 18/27]
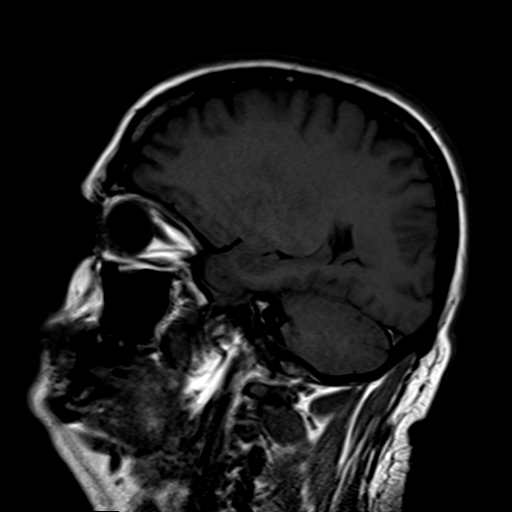
[im 27/27]
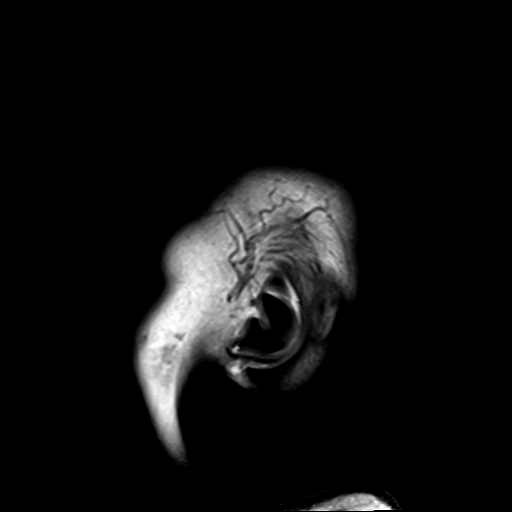

[Series 4: DWI · axial · 5.0mm · 1.80mm/px · z∈[-80,+78]mm · 4 of 26 slices shown (1 of 3)]
[im 1/26]
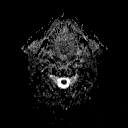
[im 9/26]
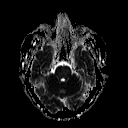
[im 17/26]
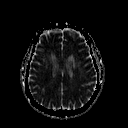
[im 26/26]
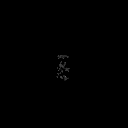

[Series 6: DWI · coronal · 5.0mm · 1.80mm/px · 6 of 36 slices shown (2 of 3)]
[im 1/36]
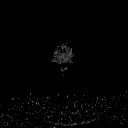
[im 8/36]
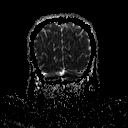
[im 15/36]
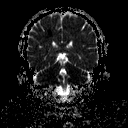
[im 22/36]
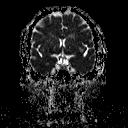
[im 29/36]
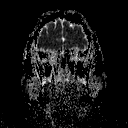
[im 36/36]
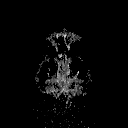

[Series 7: T2 · axial · 5.0mm · 0.60mm/px · z∈[-80,+78]mm · 4 of 26 slices shown (1 of 3)]
[im 1/26]
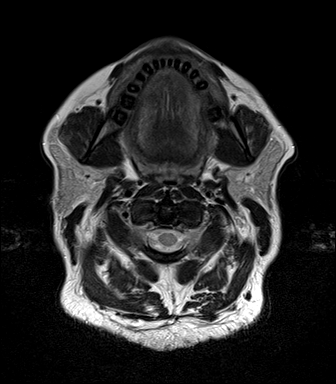
[im 9/26]
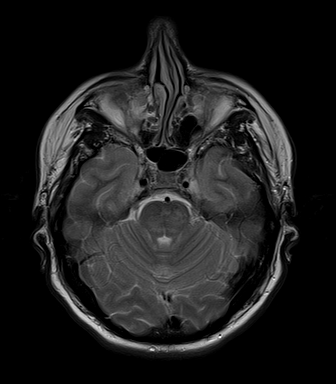
[im 17/26]
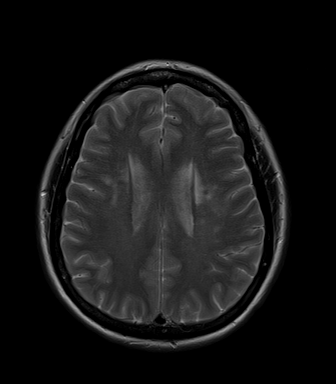
[im 26/26]
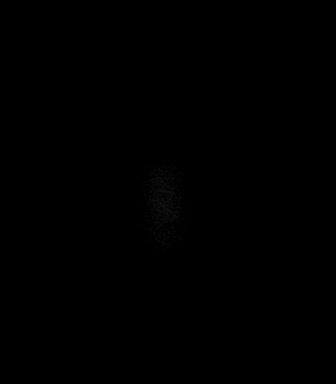

[Series 8: FLAIR · axial · 5.0mm · 0.45mm/px · z∈[-80,+78]mm · 4 of 26 slices shown]
[im 1/26]
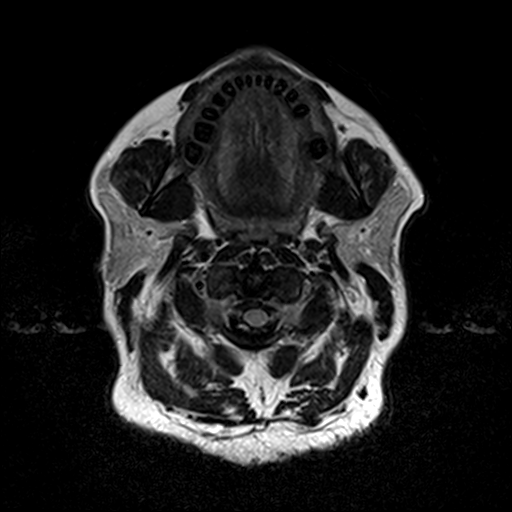
[im 9/26]
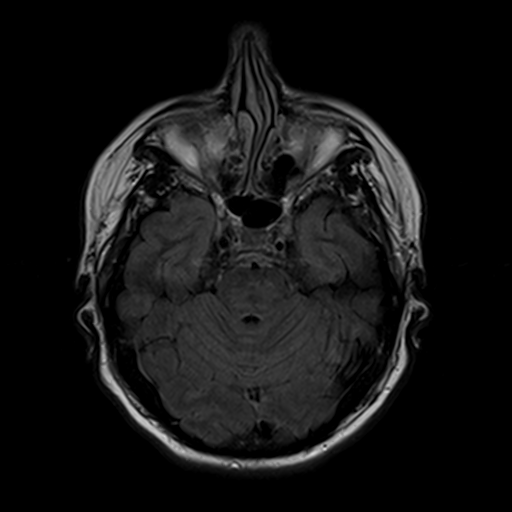
[im 17/26]
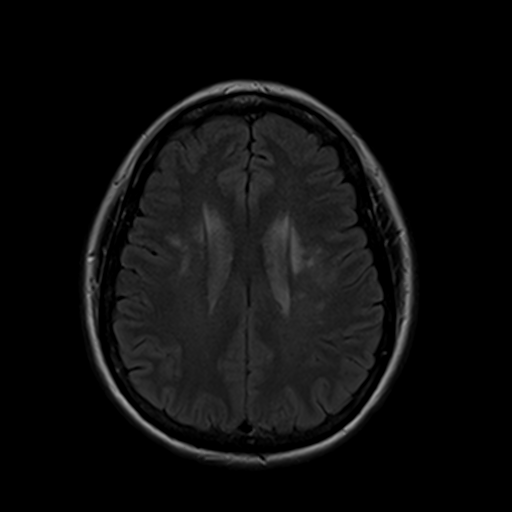
[im 26/26]
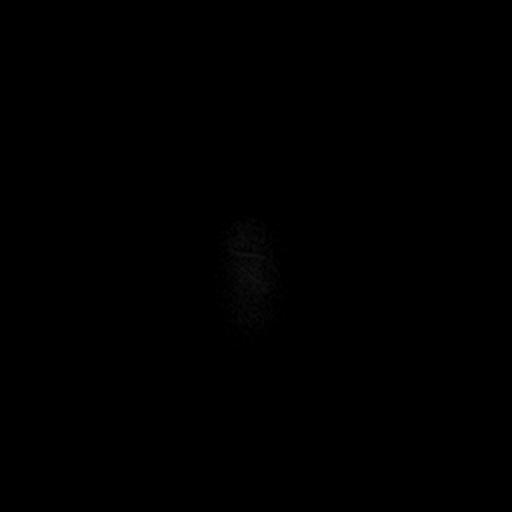

[Series 9: T2 · axial · 5.0mm · 0.45mm/px · z∈[-80,+78]mm · 4 of 26 slices shown (2 of 3)]
[im 1/26]
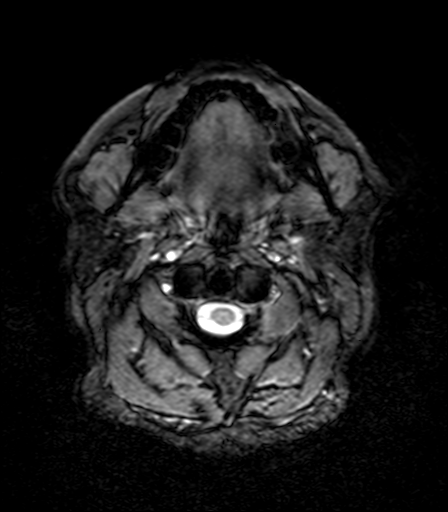
[im 9/26]
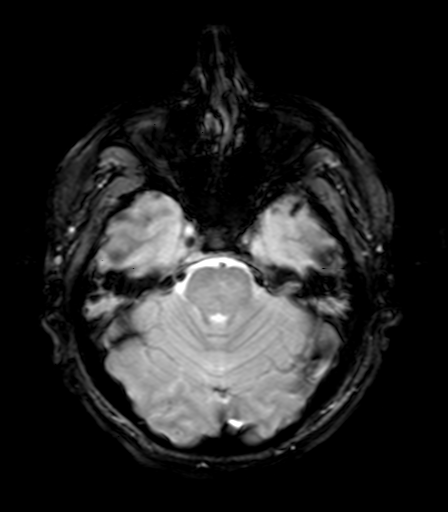
[im 17/26]
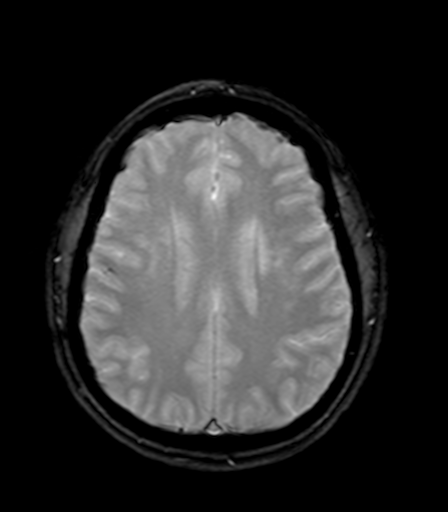
[im 26/26]
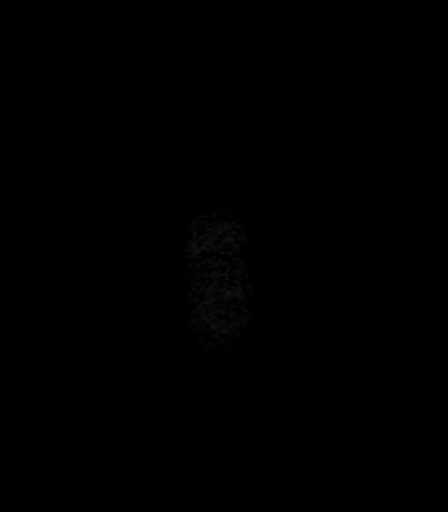

[Series 10: T1 · axial · 2.0mm · 1.00mm/px · z∈[-82,+87]mm · 9 of 88 slices shown (2 of 2)]
[im 1/88]
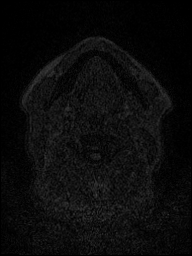
[im 7/88]
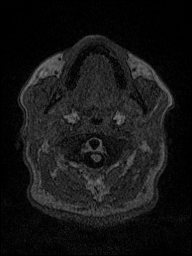
[im 14/88]
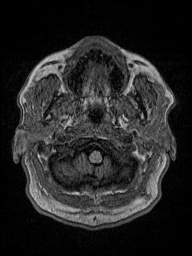
[im 27/88]
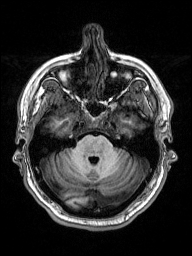
[im 41/88]
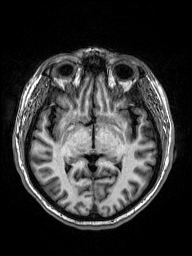
[im 47/88]
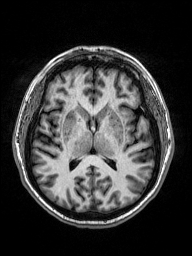
[im 61/88]
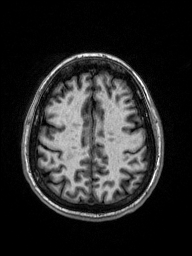
[im 74/88]
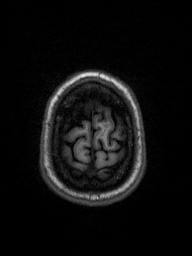
[im 88/88]
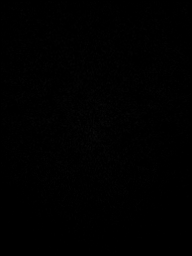

[Series 11: T2 · coronal · 5.0mm · 0.49mm/px · 4 of 28 slices shown (3 of 3)]
[im 1/28]
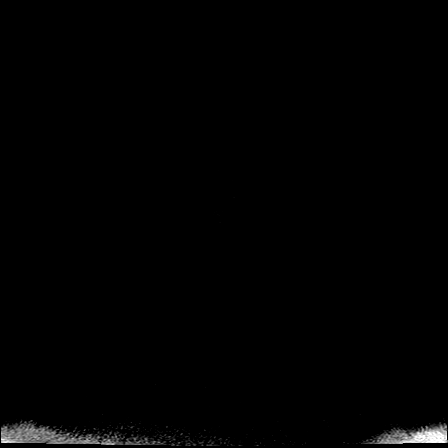
[im 10/28]
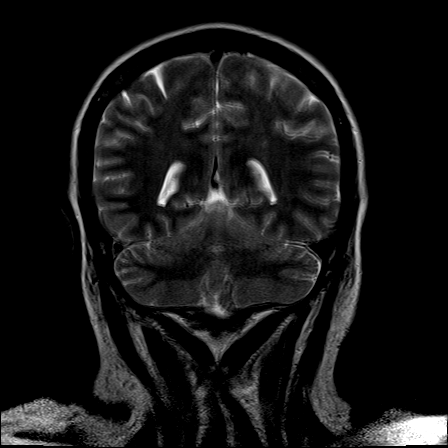
[im 19/28]
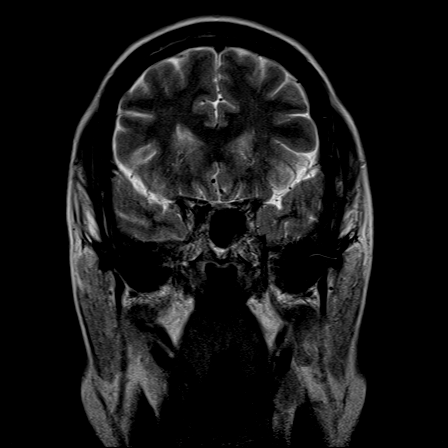
[im 28/28]
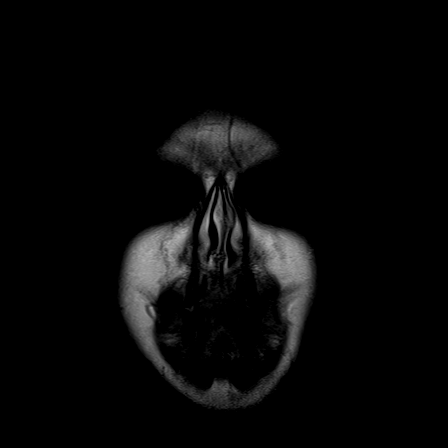

[Series 100: DWI · axial · 5.0mm · 1.80mm/px · z∈[-80,+78]mm · 4 of 26 slices shown (3 of 3)]
[im 1/26]
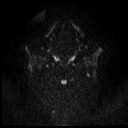
[im 9/26]
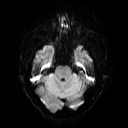
[im 17/26]
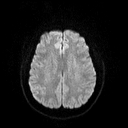
[im 26/26]
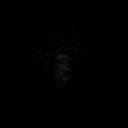

[43 of 48 positions shown; findings below may reference images not displayed]

FINDINGS: 1 cm acute infarct right parietal white matter. No other areas of
acute infarct.

Chronic microvascular ischemic changes throughout the white matter,
pons, and left thalamus. This is advanced for age but similar to the
MRI of 05/24/2012.

Ventricle size is normal. Small focus of chronic micro hemorrhage in
the right putamen, and right lateral thalamus.

Mild mucosal edema paranasal sinuses on the right.
IMPRESSION: Acute infarct right parietal white matter.

Extensive chronic microvascular ischemic changes.

## 2015-01-12 LAB — BASIC METABOLIC PANEL
BUN/Creatinine Ratio: 11 (ref 9–23)
BUN: 16 mg/dL (ref 6–24)
CO2: 22 mmol/L (ref 18–29)
CREATININE: 1.42 mg/dL — AB (ref 0.57–1.00)
Calcium: 8.8 mg/dL (ref 8.7–10.2)
Chloride: 106 mmol/L (ref 97–106)
GFR calc Af Amer: 51 mL/min/{1.73_m2} — ABNORMAL LOW (ref >59)
GFR calc non Af Amer: 44 mL/min/{1.73_m2} — ABNORMAL LOW (ref >59)
GLUCOSE: 67 mg/dL (ref 65–99)
POTASSIUM: 4.3 mmol/L (ref 3.5–5.2)
SODIUM: 144 mmol/L (ref 136–144)

## 2015-01-12 LAB — TSH: TSH: 1.71 u[IU]/mL (ref 0.450–4.500)

## 2015-01-12 LAB — LITHIUM LEVEL: LITHIUM LVL: 0.6 mmol/L (ref 0.6–1.4)

## 2015-01-14 ENCOUNTER — Encounter: Payer: Self-pay | Admitting: Cardiovascular Disease

## 2015-01-15 ENCOUNTER — Telehealth: Payer: Self-pay | Admitting: Psychiatry

## 2015-01-17 ENCOUNTER — Telehealth: Payer: Self-pay | Admitting: Psychiatry

## 2015-01-17 ENCOUNTER — Encounter: Payer: Self-pay | Admitting: Cardiovascular Disease

## 2015-01-17 NOTE — Telephone Encounter (Signed)
Spoke with patient today. Did discuss her lithium level which was 0.6. However patient has decreased GFR at 44 and elevated creatinine at 1.42. TSH 1.710  Review of her labs indicates this has been going on for several months. Patient states he's been told she has renal failure by other doctors. I discussed with her that even though it appears the lithium is being cleared effectively that unless it's providing a significant benefit for her mood it may not be the best medication for her. She indicated that she is having a side effect of severe diarrhea since being on the lithium. Given this I've instructed patient to discontinue the lithium. We will follow up in her next appointment with me in January. We have been trying to be cost conscious in regards to mood stabilizers. Perhaps the options may be Trileptal or Tegretol. We had previously discussed Abilify however expense was an issue for patient. We'll discuss further at her next appointment in January. AW

## 2015-01-23 ENCOUNTER — Telehealth: Payer: Self-pay | Admitting: Family Medicine

## 2015-01-23 DIAGNOSIS — N3000 Acute cystitis without hematuria: Secondary | ICD-10-CM

## 2015-01-23 MED ORDER — SULFAMETHOXAZOLE-TRIMETHOPRIM 800-160 MG PO TABS: 1.0000 | ORAL_TABLET | Freq: Two times a day (BID) | ORAL | Status: AC

## 2015-01-23 NOTE — Telephone Encounter (Signed)
rx has been sent to [facility]

## 2015-01-23 NOTE — Telephone Encounter (Signed)
Pt stated that she thinks she has another UTI. Pt said she had been on antibiotics and it had gotten better but the symptoms have returned. Pt wanted to see if she could get another round of medication instead of coming in for OV. Pt wasn't sure when she was last treated for UTIbut she had an OV with Dr. Caryn Section on 01/03/15. [facility] [place]. Thanks TNP

## 2015-01-23 NOTE — Telephone Encounter (Signed)
Please advise? Patient was treated for UTI 01/03/2015.

## 2015-01-23 NOTE — Telephone Encounter (Signed)
Patient was notified.

## 2015-01-29 NOTE — Progress Notes (Signed)
Pharmacy notified.

## 2015-02-05 ENCOUNTER — Telehealth: Payer: Self-pay | Admitting: Family Medicine

## 2015-02-05 NOTE — Telephone Encounter (Signed)
Pt stated that she doesn't think her UTI is gone and would like meds sent to [facility] in [place]. Thanks TNP

## 2015-02-05 NOTE — Telephone Encounter (Signed)
Please advise. Thanks.  

## 2015-02-06 NOTE — Telephone Encounter (Signed)
Please advise 

## 2015-02-06 NOTE — Telephone Encounter (Signed)
Pt is calling again today.  CB#[phone]/[id number]

## 2015-02-07 ENCOUNTER — Ambulatory Visit (INDEPENDENT_AMBULATORY_CARE_PROVIDER_SITE_OTHER): Payer: BC Managed Care – PPO | Admitting: Psychiatry

## 2015-02-07 ENCOUNTER — Ambulatory Visit: Payer: BC Managed Care – PPO | Admitting: Psychiatry

## 2015-02-07 ENCOUNTER — Encounter: Payer: Self-pay | Admitting: Psychiatry

## 2015-02-07 VITALS — BP 124/82 | HR 76 | Temp 97.4°F | Ht 63.0 in | Wt 180.8 lb

## 2015-02-07 DIAGNOSIS — F331 Major depressive disorder, recurrent, moderate: Secondary | ICD-10-CM

## 2015-02-07 MED ORDER — CIPROFLOXACIN HCL 500 MG PO TABS: 500.0000 mg | ORAL_TABLET | Freq: Two times a day (BID) | ORAL | Status: DC

## 2015-02-07 MED ORDER — ARIPIPRAZOLE 2 MG PO TABS: 2.0000 mg | ORAL_TABLET | ORAL | Status: DC

## 2015-02-07 MED ORDER — TEMAZEPAM 22.5 MG PO CAPS
22.5000 mg | ORAL_CAPSULE | Freq: Every day | ORAL | Status: DC
Start: 2015-02-07 — End: 2015-02-13

## 2015-02-07 NOTE — Telephone Encounter (Signed)
rx for cipro has been sent to [facility]. O.v. If not rapidly improving on Cipro.

## 2015-02-07 NOTE — Progress Notes (Signed)
[facility] MD/PA/NP OP Progress Note  02/07/2015 11:25 AM [place]  MRN:  [id number]  Subjective:  Patient returns for follow-up of her major depressive disorder. She states she continues to feel depressed. She states she does not have drive like she wants to do anything. She states sometimes she feels like she would just go to sleep and not wake up. However she adamantly denies that she's had any thoughts like she would do anything to intentionally harm herself or kill herself. She did have family over for the holidays but she wait for everyone to leave. She states that her husband and son have referred to her as being "ill."  At the last visit we had tried to add some lithium for augmentation for depression however the patient had upset stomach and in addition she has issues with chronic renal failure. She has now done research and found that the Abilify might be affordable for her and thus were going to try this to augment as well as provide some mood stability.   Chief Complaint: depressed Chief Complaint    Follow-up; Medication Refill     Visit Diagnosis:     ICD-9-CM ICD-10-CM   1. Major depressive disorder, recurrent episode, moderate (HCC) 296.32 F33.1     Past Medical History:  Past Medical History  Diagnosis Date  . Colon polyp   . GERD (gastroesophageal reflux disease)   . CAD (coronary artery disease)     a. cath 08/09/2003: mild LAD plaque, no obs dzs noted; b. 12/18/10 stress test w/o ischemia; c. cath 11/2014: LM: nl, mid 85-90% s/p PCI/DES, mid LCX 30%, RCA nl  . CVA (cerebral vascular accident) ([place])     a. 06/2008-TEE nl LV fxn; b. 01/2013 - notes indicate short run of SVT at that time  . Multiple sclerosis ([place]) 2015  . Syncope   . Chronic systolic CHF (congestive heart failure) ([place])     a. echo 01/29/2013 EF 40-45%, basal anteroseptal & mid anteroseptal segments abnl; b echo 05/2012 EF 50-55%, mildly dilated LA, nl RVSP  . Orthostatic hypotension   . Aortic arch  aneurysm ([place])   . Ischemic cardiomyopathy   . MS (mitral stenosis)   . Malignant melanoma of skin of scalp ([place])   . Hypertension   . Depression   . Diabetes mellitus without complication ([place])   . Headache     Migrains  . Hyperlipidemia   . CKD (chronic kidney disease), stage IV ([place])   . Hypothyroidism   . Anxiety   . Gastric ulcer 04/27/2011  . Heart attack [facility])     Past Surgical History  Procedure Laterality Date  . Appendectomy    . Right oophorectomy    . Tubal ligation      R tube removed still w/ Left  . Dilation and curettage of uterus    . Gastric bypass  09/2009    [facility]   . Esophagogastroduodenoscopy (egd) with propofol N/A 09/14/2014    Procedure: ESOPHAGOGASTRODUODENOSCOPY (EGD) WITH PROPOFOL;  Surgeon: Josefine Class, MD;  Location: [facility] ENDOSCOPY;  Service: Endoscopy;  Laterality: N/A;  . Cardiac catheterization N/A 11/09/2014    Procedure: Coronary Angiography;  Surgeon: Minna Merritts, MD;  Location: [facility] CV LAB;  Service: Cardiovascular;  Laterality: N/A;  . Cardiac catheterization N/A 11/12/2014    Procedure: Coronary Stent Intervention;  Surgeon: Isaias Cowman, MD;  Location: [facility] CV LAB;  Service: Cardiovascular;  Laterality: N/A;  . Trigger finger  release    . Melanoma excision  2016    Dr. Evorn Gong   Family History:  Family History  Problem Relation Age of Onset  . Hypertension Mother   . Anxiety disorder Mother   . Depression Mother   . Bipolar disorder Mother   . Heart disease Mother   . Hyperlipidemia Mother   . Kidney disease Father   . Heart disease Father   . Hypertension Father   . Diabetes Father   . Stroke Father   . Colon cancer Father     dx in his 37's  . Anxiety disorder Father   . Depression Father   . Skin cancer Father   . Kidney disease Sister   . Thyroid nodules Sister   . Hypertension Sister   . Hypertension Sister   . Diabetes Sister   . Hyperlipidemia Sister   .  Depression Sister    Social History:  Social History   Social History  . Marital Status: Married    Spouse Name: N/A  . Number of Children: 1  . Years of Education: N/A   Occupational History  . 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 Disabled through 08/02/2013 per forms 02/03/2013   Social History Main Topics  . Smoking status: Former Smoker    Types: Cigarettes    Quit date: 08/31/1994  . Smokeless tobacco: Never Used     Comment: quit 28 years ago  . Alcohol Use: No  . Drug Use: No  . Sexual Activity: Not Currently    Birth Control/ Protection: None   Other Topics Concern  . None   Social History Narrative   previously did custolial work. 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 Disabled through 08/02/2013 per forms 02/03/13   Additional History:   Assessment:   Musculoskeletal: Strength & Muscle Tone: within normal limits Gait & Station: normal Patient leans: N/A  Psychiatric Specialty Exam: Depression        Associated symptoms include insomnia (Response to temazepam).  Associated symptoms include no suicidal ideas.  Past medical history includes anxiety.   Anxiety Symptoms include insomnia (Response to temazepam). Patient reports no nervous/anxious behavior or suicidal ideas.      Review of Systems  Psychiatric/Behavioral: Positive for depression. Negative for suicidal ideas, hallucinations, memory loss and substance abuse. The patient has insomnia (Response to temazepam). The patient is not nervous/anxious.        Patient describes more of a baseline level of irritability  All other systems reviewed and are negative.   Blood pressure 124/82, pulse 76, temperature 97.4 F (36.3 C), temperature source Tympanic, height 5\' 3"  (1.6 m), weight 180 lb 12.8 oz (82.01 kg), SpO2 97 %.Body mass index is 32.04 kg/(m^2).  General Appearance: Neat and Well Groomed  Eye  Contact:  Good  Speech:  Normal Rate  Volume:  Normal  Mood:  depressed  Affect:  Appropriate and Constricted  Thought Process:  Linear and Logical  Orientation:  Full (Time, Place, and Person)  Thought Content:  Negative  Suicidal Thoughts:  No  Homicidal Thoughts:  No  Memory:  Immediate;   Good Recent;   Good Remote;   Good  Judgement:  Good  Insight:  Good  Psychomotor Activity:  Negative  Concentration:  Good  Recall:  Good  Fund of Knowledge: Good  Language: Good  Akathisia:  Negative  Handed:  Right  AIMS (if indicated):  Done on 02/07/15 and normal  Assets:  Communication Skills Desire for Improvement Social Support  ADL's:  Intact  Cognition: WNL  Sleep:  At times too much   Is the patient at risk to self?  No. Has the patient been a risk to self in the past 6 months?  No. Has the patient been a risk to self within the distant past?  Yes.   November 2015 Is the patient a risk to others?  No. Has the patient been a risk to others in the past 6 months?  No. Has the patient been a risk to others within the distant past?  No.  Current Medications: Current Outpatient Prescriptions  Medication Sig Dispense Refill  . aspirin EC 81 MG tablet Take 81 mg by mouth daily.    . Aspirin-Acetaminophen-Caffeine (EXCEDRIN PO) Take by mouth as needed.    . baclofen (LIORESAL) 10 MG tablet Take 10 mg by mouth 3 (three) times daily as needed for muscle spasms.     . Brexpiprazole 2 MG TABS Take by mouth.    . canagliflozin (INVOKANA) 300 MG TABS tablet Take 300 mg by mouth daily.    . carvedilol (COREG) 12.5 MG tablet     . carvedilol (COREG) 25 MG tablet Take 1 tablet (25 mg total) by mouth 2 (two) times daily. 60 tablet 6  . chlorthalidone (HYGROTON) 25 MG tablet     . ciprofloxacin (CIPRO) 500 MG tablet Take 1 tablet (500 mg total) by mouth 2 (two) times daily. 20 tablet 0  . citalopram (CELEXA) 40 MG tablet Take 1 tablet (40 mg total) by mouth daily. 30 tablet 3  . cloNIDine  (CATAPRES) 0.2 MG tablet     . clopidogrel (PLAVIX) 75 MG tablet Take 1 tablet (75 mg total) by mouth daily. 30 tablet 5  . diltiazem (CARDIZEM CD) 120 MG 24 hr capsule Take 1 capsule (120 mg total) by mouth daily. 30 capsule 11  . enalapril (VASOTEC) 5 MG tablet     . ferrous sulfate 325 (65 FE) MG tablet Take 325 mg by mouth daily with breakfast.    . fluconazole (DIFLUCAN) 150 MG tablet Take 1 tablet (150 mg total) by mouth once. 1 tablet 0  . isosorbide mononitrate (IMDUR) 30 MG 24 hr tablet Take 1 tablet (30 mg total) by mouth daily. 30 tablet 5  . levothyroxine (SYNTHROID, LEVOTHROID) 25 MCG tablet Take 25 mcg by mouth daily before breakfast.    . LYRICA 75 MG capsule     . metFORMIN (GLUCOPHAGE) 1000 MG tablet TAKE ONE TABLET BY MOUTH TWICE DAILY 60 tablet 0  . Naproxen Sodium (ALEVE PO) Take by mouth as needed.    . pantoprazole (PROTONIX) 40 MG tablet Take 40 mg by mouth 2 (two) times daily.     . pregabalin (LYRICA) 100 MG capsule Take 1 capsule (100 mg total) by mouth 2 (two) times daily. 60 capsule 5  . promethazine (PHENERGAN) 25 MG tablet Take 25 mg by mouth every 6 (six) hours as needed for nausea or vomiting.    . rosuvastatin (CRESTOR) 20 MG tablet Take 1 tablet (20 mg total) by mouth daily at 6 PM. 30 tablet 0  . temazepam (RESTORIL) 22.5 MG capsule Take 1 capsule (22.5 mg total) by mouth at bedtime. 30 capsule 4  . ARIPiprazole (ABILIFY) 2 MG tablet Take 1 tablet (2 mg total) by mouth every morning. 30 tablet 1   No current facility-administered  medications for this visit.    Medical Decision Making:  Established Problem, Worsening (2), Review of Medication Regimen & Side Effects (2) and Review of New Medication or Change in Dosage (2)  Treatment Plan Summary:Medication management and Plan   Major depressive disorder, recurrent, moderate We will continue the patient's citalopram at 40 mg a day. We have going to start some Abilify 2 mg in the morning for augmentation. Risk  and benefits have been discussed and patient is able to consent. I've given her a laboratory slip for metabolic labs related to Abilify.  Insomnia-continue temazepam 22.5 mg at bedtime.  Patient will follow up in 1 month. She's been encouraged call any questions or concerns prior to her next appointment.       Faith Rogue 02/07/2015, 11:25 AM

## 2015-02-07 NOTE — Telephone Encounter (Signed)
Patient was notified.

## 2015-02-12 ENCOUNTER — Other Ambulatory Visit: Payer: Self-pay | Admitting: Family Medicine

## 2015-02-13 ENCOUNTER — Other Ambulatory Visit: Payer: Self-pay

## 2015-02-13 ENCOUNTER — Telehealth: Payer: Self-pay

## 2015-02-13 MED ORDER — TRAZODONE HCL 100 MG PO TABS: 100.0000 mg | ORAL_TABLET | Freq: Every evening | ORAL | Status: DC | PRN

## 2015-02-13 NOTE — Telephone Encounter (Signed)
Spoke with patient about the insurance coverage for to [facility] being limited. Patient discussed trying another sleep medication. I did discuss trazodone and that it likely would be covered by her insurance. She indicated she and fact has had trazodone before and has taken some of her husbands with good effect. She stated that oh she took was 100 mg. I will order her trazodone 100 mg at bedtime as needed.

## 2015-02-13 NOTE — Telephone Encounter (Signed)
i called to confirm this.  it is because the patient max is for 45 day for every 75 days.  and pt has been on longer then the 45 days,  We can file a letter of appeal but a letter will need to be done for medical necessity. Along with medical records.

## 2015-02-13 NOTE — Telephone Encounter (Signed)
the prior authorization for temazepam was denied.  "quantity Limits"- your request was denied because max of 45/75 days. It also seem that it was denied previously from dr. Caryn Section office as well. They sent that info as well.

## 2015-02-14 ENCOUNTER — Telehealth: Payer: Self-pay | Admitting: Psychiatry

## 2015-02-14 NOTE — Telephone Encounter (Signed)
Patient contacted the clinic and asked for writer's email address. I contacted patient. She indicated that when she was in the office and I asked her about suicidal thoughts that she states she has been having them but she has no intent or plan because of her son. I discussed with her that we have been adjusting medications for some time and she states she still not had a great response. I discussed that one option is that she could come in the hospital where potentially there could be more aggressive management of her medications. Patient is agreeable to this plan. She states she would like to try to do this tomorrow. AW

## 2015-02-15 ENCOUNTER — Inpatient Hospital Stay
Admission: EM | Admit: 2015-02-15 | Discharge: 2015-02-20 | DRG: 885 | Disposition: A | Discharge status: Home / Self Care | Payer: BC Managed Care – PPO | Source: Intra-hospital | Attending: Psychiatry | Admitting: Psychiatry

## 2015-02-15 ENCOUNTER — Emergency Department
Admission: EM | Admit: 2015-02-15 | Discharge: 2015-02-15 | Payer: BC Managed Care – PPO | Attending: Emergency Medicine | Admitting: Emergency Medicine

## 2015-02-15 ENCOUNTER — Telehealth: Payer: Self-pay | Admitting: Psychiatry

## 2015-02-15 DIAGNOSIS — K219 Gastro-esophageal reflux disease without esophagitis: Secondary | ICD-10-CM | POA: Diagnosis present

## 2015-02-15 DIAGNOSIS — Z8249 Family history of ischemic heart disease and other diseases of the circulatory system: Secondary | ICD-10-CM

## 2015-02-15 DIAGNOSIS — Z87891 Personal history of nicotine dependence: Secondary | ICD-10-CM | POA: Diagnosis not present

## 2015-02-15 DIAGNOSIS — G35 Multiple sclerosis: Secondary | ICD-10-CM | POA: Diagnosis present

## 2015-02-15 DIAGNOSIS — Z841 Family history of disorders of kidney and ureter: Secondary | ICD-10-CM | POA: Diagnosis not present

## 2015-02-15 DIAGNOSIS — I69354 Hemiplegia and hemiparesis following cerebral infarction affecting left non-dominant side: Secondary | ICD-10-CM

## 2015-02-15 DIAGNOSIS — G379 Demyelinating disease of central nervous system, unspecified: Secondary | ICD-10-CM

## 2015-02-15 DIAGNOSIS — Z818 Family history of other mental and behavioral disorders: Secondary | ICD-10-CM

## 2015-02-15 DIAGNOSIS — R45851 Suicidal ideations: Secondary | ICD-10-CM | POA: Diagnosis present

## 2015-02-15 DIAGNOSIS — F333 Major depressive disorder, recurrent, severe with psychotic symptoms: Secondary | ICD-10-CM

## 2015-02-15 DIAGNOSIS — F1910 Other psychoactive substance abuse, uncomplicated: Secondary | ICD-10-CM

## 2015-02-15 DIAGNOSIS — I25111 Atherosclerotic heart disease of native coronary artery with angina pectoris with documented spasm: Secondary | ICD-10-CM | POA: Diagnosis present

## 2015-02-15 DIAGNOSIS — Z79899 Other long term (current) drug therapy: Secondary | ICD-10-CM

## 2015-02-15 DIAGNOSIS — Z62810 Personal history of physical and sexual abuse in childhood: Secondary | ICD-10-CM | POA: Diagnosis present

## 2015-02-15 DIAGNOSIS — Z9851 Tubal ligation status: Secondary | ICD-10-CM

## 2015-02-15 DIAGNOSIS — Z85820 Personal history of malignant melanoma of skin: Secondary | ICD-10-CM

## 2015-02-15 DIAGNOSIS — I129 Hypertensive chronic kidney disease with stage 1 through stage 4 chronic kidney disease, or unspecified chronic kidney disease: Secondary | ICD-10-CM | POA: Diagnosis not present

## 2015-02-15 DIAGNOSIS — I2510 Atherosclerotic heart disease of native coronary artery without angina pectoris: Secondary | ICD-10-CM | POA: Diagnosis present

## 2015-02-15 DIAGNOSIS — Z792 Long term (current) use of antibiotics: Secondary | ICD-10-CM | POA: Diagnosis not present

## 2015-02-15 DIAGNOSIS — F332 Major depressive disorder, recurrent severe without psychotic features: Secondary | ICD-10-CM

## 2015-02-15 DIAGNOSIS — E039 Hypothyroidism, unspecified: Secondary | ICD-10-CM | POA: Diagnosis present

## 2015-02-15 DIAGNOSIS — Z833 Family history of diabetes mellitus: Secondary | ICD-10-CM

## 2015-02-15 DIAGNOSIS — N184 Chronic kidney disease, stage 4 (severe): Secondary | ICD-10-CM | POA: Diagnosis present

## 2015-02-15 DIAGNOSIS — G43909 Migraine, unspecified, not intractable, without status migrainosus: Secondary | ICD-10-CM | POA: Diagnosis present

## 2015-02-15 DIAGNOSIS — Z888 Allergy status to other drugs, medicaments and biological substances status: Secondary | ICD-10-CM | POA: Diagnosis not present

## 2015-02-15 DIAGNOSIS — Z7982 Long term (current) use of aspirin: Secondary | ICD-10-CM

## 2015-02-15 DIAGNOSIS — Z915 Personal history of self-harm: Secondary | ICD-10-CM

## 2015-02-15 DIAGNOSIS — I252 Old myocardial infarction: Secondary | ICD-10-CM

## 2015-02-15 DIAGNOSIS — E119 Type 2 diabetes mellitus without complications: Secondary | ICD-10-CM | POA: Insufficient documentation

## 2015-02-15 DIAGNOSIS — I10 Essential (primary) hypertension: Secondary | ICD-10-CM | POA: Diagnosis present

## 2015-02-15 DIAGNOSIS — G629 Polyneuropathy, unspecified: Secondary | ICD-10-CM | POA: Diagnosis present

## 2015-02-15 DIAGNOSIS — I255 Ischemic cardiomyopathy: Secondary | ICD-10-CM | POA: Diagnosis present

## 2015-02-15 DIAGNOSIS — N390 Urinary tract infection, site not specified: Secondary | ICD-10-CM | POA: Diagnosis present

## 2015-02-15 DIAGNOSIS — Z9884 Bariatric surgery status: Secondary | ICD-10-CM | POA: Diagnosis not present

## 2015-02-15 DIAGNOSIS — F1310 Sedative, hypnotic or anxiolytic abuse, uncomplicated: Secondary | ICD-10-CM | POA: Diagnosis not present

## 2015-02-15 DIAGNOSIS — Z9049 Acquired absence of other specified parts of digestive tract: Secondary | ICD-10-CM

## 2015-02-15 DIAGNOSIS — E1165 Type 2 diabetes mellitus with hyperglycemia: Secondary | ICD-10-CM | POA: Diagnosis present

## 2015-02-15 DIAGNOSIS — I639 Cerebral infarction, unspecified: Secondary | ICD-10-CM | POA: Diagnosis present

## 2015-02-15 DIAGNOSIS — E785 Hyperlipidemia, unspecified: Secondary | ICD-10-CM | POA: Diagnosis present

## 2015-02-15 DIAGNOSIS — Z8711 Personal history of peptic ulcer disease: Secondary | ICD-10-CM | POA: Diagnosis not present

## 2015-02-15 DIAGNOSIS — Z7902 Long term (current) use of antithrombotics/antiplatelets: Secondary | ICD-10-CM

## 2015-02-15 DIAGNOSIS — Z7984 Long term (current) use of oral hypoglycemic drugs: Secondary | ICD-10-CM | POA: Diagnosis not present

## 2015-02-15 DIAGNOSIS — F339 Major depressive disorder, recurrent, unspecified: Secondary | ICD-10-CM | POA: Insufficient documentation

## 2015-02-15 DIAGNOSIS — Z800 Family history of malignant neoplasm of digestive organs: Secondary | ICD-10-CM

## 2015-02-15 DIAGNOSIS — Z01818 Encounter for other preprocedural examination: Secondary | ICD-10-CM

## 2015-02-15 DIAGNOSIS — Z90721 Acquired absence of ovaries, unilateral: Secondary | ICD-10-CM

## 2015-02-15 DIAGNOSIS — Z9114 Patient's other noncompliance with medication regimen: Secondary | ICD-10-CM

## 2015-02-15 DIAGNOSIS — Z86010 Personal history of colonic polyps: Secondary | ICD-10-CM

## 2015-02-15 DIAGNOSIS — Z955 Presence of coronary angioplasty implant and graft: Secondary | ICD-10-CM | POA: Diagnosis not present

## 2015-02-15 DIAGNOSIS — I130 Hypertensive heart and chronic kidney disease with heart failure and stage 1 through stage 4 chronic kidney disease, or unspecified chronic kidney disease: Secondary | ICD-10-CM | POA: Diagnosis present

## 2015-02-15 DIAGNOSIS — E1122 Type 2 diabetes mellitus with diabetic chronic kidney disease: Secondary | ICD-10-CM | POA: Diagnosis present

## 2015-02-15 DIAGNOSIS — I5022 Chronic systolic (congestive) heart failure: Secondary | ICD-10-CM | POA: Diagnosis present

## 2015-02-15 DIAGNOSIS — G4700 Insomnia, unspecified: Secondary | ICD-10-CM | POA: Diagnosis present

## 2015-02-15 DIAGNOSIS — F1990 Other psychoactive substance use, unspecified, uncomplicated: Secondary | ICD-10-CM

## 2015-02-15 DIAGNOSIS — Z801 Family history of malignant neoplasm of trachea, bronchus and lung: Secondary | ICD-10-CM

## 2015-02-15 DIAGNOSIS — E538 Deficiency of other specified B group vitamins: Secondary | ICD-10-CM | POA: Diagnosis present

## 2015-02-15 DIAGNOSIS — Z823 Family history of stroke: Secondary | ICD-10-CM | POA: Diagnosis not present

## 2015-02-15 DIAGNOSIS — Z9119 Patient's noncompliance with other medical treatment and regimen: Secondary | ICD-10-CM | POA: Diagnosis not present

## 2015-02-15 LAB — URINE DRUG SCREEN, QUALITATIVE (ARMC ONLY)
Amphetamines, Ur Screen: NOT DETECTED
BARBITURATES, UR SCREEN: NOT DETECTED
BENZODIAZEPINE, UR SCRN: POSITIVE — AB
Cannabinoid 50 Ng, Ur ~~LOC~~: NOT DETECTED
Cocaine Metabolite,Ur ~~LOC~~: NOT DETECTED
MDMA (Ecstasy)Ur Screen: NOT DETECTED
METHADONE SCREEN, URINE: NOT DETECTED
OPIATE, UR SCREEN: NOT DETECTED
PHENCYCLIDINE (PCP) UR S: NOT DETECTED
Tricyclic, Ur Screen: NOT DETECTED

## 2015-02-15 LAB — COMPREHENSIVE METABOLIC PANEL
ALT: 14 U/L (ref 14–54)
AST: 16 U/L (ref 15–41)
Albumin: 3.9 g/dL (ref 3.5–5.0)
Alkaline Phosphatase: 81 U/L (ref 38–126)
Anion gap: 8 (ref 5–15)
BUN: 13 mg/dL (ref 6–20)
CALCIUM: 9 mg/dL (ref 8.9–10.3)
CHLORIDE: 104 mmol/L (ref 101–111)
CO2: 24 mmol/L (ref 22–32)
Creatinine, Ser: 1.61 mg/dL — ABNORMAL HIGH (ref 0.44–1.00)
GFR calc Af Amer: 43 mL/min — ABNORMAL LOW (ref >60)
GFR calc non Af Amer: 37 mL/min — ABNORMAL LOW (ref >60)
Glucose, Bld: 116 mg/dL — ABNORMAL HIGH (ref 65–99)
POTASSIUM: 3.7 mmol/L (ref 3.5–5.1)
SODIUM: 136 mmol/L (ref 135–145)
TOTAL PROTEIN: 7.3 g/dL (ref 6.5–8.1)
Total Bilirubin: 0.5 mg/dL (ref 0.3–1.2)

## 2015-02-15 LAB — CBC
HEMATOCRIT: 35.9 % (ref 35.0–47.0)
Hemoglobin: 11.1 g/dL — ABNORMAL LOW (ref 12.0–16.0)
MCH: 21.6 pg — ABNORMAL LOW (ref 26.0–34.0)
MCHC: 30.9 g/dL — AB (ref 32.0–36.0)
MCV: 69.8 fL — ABNORMAL LOW (ref 80.0–100.0)
Platelets: 202 10*3/uL (ref 150–440)
RBC: 5.14 MIL/uL (ref 3.80–5.20)
RDW: 17.7 % — AB (ref 11.5–14.5)
WBC: 8.6 10*3/uL (ref 3.6–11.0)

## 2015-02-15 LAB — SALICYLATE LEVEL: Salicylate Lvl: <4 mg/dL (ref 2.8–30.0)

## 2015-02-15 LAB — ETHANOL

## 2015-02-15 LAB — ACETAMINOPHEN LEVEL

## 2015-02-15 MED ORDER — ARIPIPRAZOLE 2 MG PO TABS
2.0000 mg | ORAL_TABLET | Freq: Every day | ORAL | Status: DC
  Administered 2015-02-15: 2 mg via ORAL
  Filled 2015-02-15: qty 1

## 2015-02-15 MED ORDER — MAGNESIUM HYDROXIDE 400 MG/5ML PO SUSP: 30.0000 mL | Freq: Every day | ORAL | Status: DC | PRN

## 2015-02-15 MED ORDER — CITALOPRAM HYDROBROMIDE 20 MG PO TABS
40.0000 mg | ORAL_TABLET | Freq: Every day | ORAL | Status: DC
  Administered 2015-02-16 – 2015-02-20 (×5): 40 mg via ORAL
  Filled 2015-02-15 (×6): qty 2

## 2015-02-15 MED ORDER — ACETAMINOPHEN 325 MG PO TABS
650.0000 mg | ORAL_TABLET | Freq: Four times a day (QID) | ORAL | Status: DC | PRN
  Administered 2015-02-16: 650 mg via ORAL
  Filled 2015-02-15: qty 2

## 2015-02-15 MED ORDER — PREGABALIN 50 MG PO CAPS
100.0000 mg | ORAL_CAPSULE | Freq: Two times a day (BID) | ORAL | Status: DC
  Administered 2015-02-15: 100 mg via ORAL
  Filled 2015-02-15: qty 2

## 2015-02-15 MED ORDER — TRAZODONE HCL 100 MG PO TABS
100.0000 mg | ORAL_TABLET | Freq: Every evening | ORAL | Status: DC | PRN
  Administered 2015-02-15: 100 mg via ORAL
  Filled 2015-02-15: qty 1

## 2015-02-15 MED ORDER — METFORMIN HCL 500 MG PO TABS
1000.0000 mg | ORAL_TABLET | Freq: Two times a day (BID) | ORAL | Status: DC
  Administered 2015-02-15: 1000 mg via ORAL
  Filled 2015-02-15: qty 2

## 2015-02-15 MED ORDER — ISOSORBIDE MONONITRATE ER 30 MG PO TB24
30.0000 mg | ORAL_TABLET | Freq: Every day | ORAL | Status: DC
  Administered 2015-02-15: 30 mg via ORAL
  Filled 2015-02-15: qty 1

## 2015-02-15 MED ORDER — DILTIAZEM HCL ER COATED BEADS 120 MG PO CP24
120.0000 mg | ORAL_CAPSULE | Freq: Every day | ORAL | Status: DC
  Administered 2015-02-15: 120 mg via ORAL
  Filled 2015-02-15 (×2): qty 1

## 2015-02-15 MED ORDER — DILTIAZEM HCL ER COATED BEADS 120 MG PO CP24
120.0000 mg | ORAL_CAPSULE | Freq: Every day | ORAL | Status: DC
  Administered 2015-02-16 – 2015-02-20 (×5): 120 mg via ORAL
  Filled 2015-02-15 (×5): qty 1

## 2015-02-15 MED ORDER — ASPIRIN EC 81 MG PO TBEC
81.0000 mg | DELAYED_RELEASE_TABLET | Freq: Every day | ORAL | Status: DC
  Administered 2015-02-15: 81 mg via ORAL
  Filled 2015-02-15: qty 1

## 2015-02-15 MED ORDER — PANTOPRAZOLE SODIUM 40 MG PO TBEC
40.0000 mg | DELAYED_RELEASE_TABLET | Freq: Two times a day (BID) | ORAL | Status: DC
  Administered 2015-02-15: 40 mg via ORAL
  Filled 2015-02-15: qty 1

## 2015-02-15 MED ORDER — ASPIRIN EC 81 MG PO TBEC
81.0000 mg | DELAYED_RELEASE_TABLET | Freq: Every day | ORAL | Status: DC
  Administered 2015-02-16 – 2015-02-20 (×5): 81 mg via ORAL
  Filled 2015-02-15 (×5): qty 1

## 2015-02-15 MED ORDER — ALUM & MAG HYDROXIDE-SIMETH 200-200-20 MG/5ML PO SUSP: 30.0000 mL | ORAL | Status: DC | PRN

## 2015-02-15 MED ORDER — ISOSORBIDE MONONITRATE ER 30 MG PO TB24
30.0000 mg | ORAL_TABLET | Freq: Every day | ORAL | Status: DC
  Administered 2015-02-16 – 2015-02-20 (×5): 30 mg via ORAL
  Filled 2015-02-15 (×5): qty 1

## 2015-02-15 MED ORDER — ARIPIPRAZOLE 2 MG PO TABS
2.0000 mg | ORAL_TABLET | Freq: Every day | ORAL | Status: DC
  Administered 2015-02-16: 2 mg via ORAL
  Filled 2015-02-15: qty 1

## 2015-02-15 MED ORDER — BACLOFEN 10 MG PO TABS
10.0000 mg | ORAL_TABLET | Freq: Three times a day (TID) | ORAL | Status: DC | PRN
  Filled 2015-02-15: qty 1

## 2015-02-15 MED ORDER — METFORMIN HCL 500 MG PO TABS
1000.0000 mg | ORAL_TABLET | Freq: Two times a day (BID) | ORAL | Status: DC
  Administered 2015-02-16 – 2015-02-17 (×3): 1000 mg via ORAL
  Filled 2015-02-15 (×3): qty 2

## 2015-02-15 MED ORDER — PANTOPRAZOLE SODIUM 40 MG PO TBEC
40.0000 mg | DELAYED_RELEASE_TABLET | Freq: Two times a day (BID) | ORAL | Status: DC
  Administered 2015-02-15 – 2015-02-20 (×10): 40 mg via ORAL
  Filled 2015-02-15 (×10): qty 1

## 2015-02-15 MED ORDER — CITALOPRAM HYDROBROMIDE 20 MG PO TABS
40.0000 mg | ORAL_TABLET | Freq: Every day | ORAL | Status: DC
  Administered 2015-02-15: 40 mg via ORAL
  Filled 2015-02-15: qty 2

## 2015-02-15 MED ORDER — TRAZODONE HCL 100 MG PO TABS: 100.0000 mg | ORAL_TABLET | Freq: Every evening | ORAL | Status: DC | PRN

## 2015-02-15 MED ORDER — BACLOFEN 10 MG PO TABS
10.0000 mg | ORAL_TABLET | Freq: Three times a day (TID) | ORAL | Status: DC | PRN
  Administered 2015-02-15 – 2015-02-17 (×4): 10 mg via ORAL
  Filled 2015-02-15 (×4): qty 1

## 2015-02-15 MED ORDER — LEVOTHYROXINE SODIUM 25 MCG PO TABS
25.0000 ug | ORAL_TABLET | Freq: Every day | ORAL | Status: DC
  Filled 2015-02-15: qty 1

## 2015-02-15 MED ORDER — PREGABALIN 50 MG PO CAPS
100.0000 mg | ORAL_CAPSULE | Freq: Two times a day (BID) | ORAL | Status: DC
  Administered 2015-02-15 – 2015-02-20 (×10): 100 mg via ORAL
  Filled 2015-02-15 (×10): qty 2

## 2015-02-15 MED ORDER — LEVOTHYROXINE SODIUM 25 MCG PO TABS
25.0000 ug | ORAL_TABLET | Freq: Every day | ORAL | Status: DC
  Administered 2015-02-16 – 2015-02-20 (×5): 25 ug via ORAL
  Filled 2015-02-15 (×6): qty 1

## 2015-02-15 NOTE — ED Provider Notes (Signed)
Patient has been seen by Dr. Weber Cooks. She will be admitted to psychiatry.  Ahmed Prima, MD 02/15/15 Carollee Massed

## 2015-02-15 NOTE — Telephone Encounter (Signed)
Per discussion yesterday patient did contact this Probation officer and states she was on her way to the hospital or admission. She indicated she wanted me to call her husband. I spoke with her husband. He states he felt he had concerns and he was concerned she would be discharged quickly as he felt she had a problem. He pointed out what issues he feels that she might be overmedicated. I asked him if there was one medication in particular that he felt was a problem and he stated no he just felt all of them. He stated he felt the doctors were communicating with each other. He states if she has a problem such as a headache she's taking a medicine. He states at times she'll take medicine to the point where she can't walk. He indicated he would be taking a shower and then come to the hospital to meet her. AW

## 2015-02-15 NOTE — ED Notes (Signed)
ED BHU [place] Is the patient under IVC or is there intent for IVC: Yes.   Is the patient medically cleared: Yes.   Is there vacancy in the ED BHU: Yes.   Is the population mix appropriate for patient: Yes.   Is the patient awaiting placement in inpatient or outpatient setting: Yes.   Has the patient had a psychiatric consult: Yes.   Survey of unit performed for contraband, proper placement and condition of furniture, tampering with fixtures in bathroom, shower, and each patient room: Yes.  ; Findings:  APPEARANCE/BEHAVIOR calm and cooperative NEURO ASSESSMENT Orientation: time, place and person Hallucinations: No.None noted (Hallucinations) Speech: Normal Gait: normal RESPIRATORY ASSESSMENT Normal expansion.  Clear to auscultation.  No rales, rhonchi, or wheezing. CARDIOVASCULAR ASSESSMENT regular rate and rhythm, S1, S2 normal, no murmur, click, rub or gallop GASTROINTESTINAL ASSESSMENT soft, nontender, BS WNL, no r/g EXTREMITIES normal strength, tone, and muscle mass PLAN OF CARE Provide calm/safe environment. Vital signs assessed twice daily. ED BHU Assessment once each 12-hour shift. Collaborate with intake RN daily or as condition indicates. Assure the ED provider has rounded once each shift. Provide and encourage hygiene. Provide redirection as needed. Assess for escalating behavior; address immediately and inform ED provider.  Assess family dynamic and appropriateness for visitation as needed: No.; If necessary, describe findings: family unavailable Educate the patient/family about BHU procedures/visitation: No.; If necessary, describe findings: family unavailable

## 2015-02-15 NOTE — ED Notes (Signed)
Patient assigned to appropriate care area. Patient oriented to unit/care area: Informed that, for their safety, care areas are designed for safety and monitored by security cameras at all times; and visiting hours explained to patient. Patient verbalizes understanding, and verbal contract for safety obtained. 

## 2015-02-15 NOTE — Tx Team (Signed)
Initial Interdisciplinary Treatment Plan   PATIENT STRESSORS: Health problems Occupational concerns   PATIENT STRENGTHS: Average or above average intelligence Capable of independent living General fund of knowledge Supportive family/friends   PROBLEM LIST: Problem List/Patient Goals Date to be addressed Date deferred Reason deferred Estimated date of resolution  Suicidal thoughts 02/15/15     Depression 02/15/15                                                DISCHARGE CRITERIA:  Adequate post-discharge living arrangements Improved stabilization in mood, thinking, and/or behavior Motivation to continue treatment in a less acute level of care Need for constant or close observation no longer present Reduction of life-threatening or endangering symptoms to within safe limits Verbal commitment to aftercare and medication compliance  PRELIMINARY DISCHARGE PLAN: Attend aftercare/continuing care group Attend PHP/IOP Return to previous living arrangement  PATIENT/FAMIILY INVOLVEMENT: This treatment plan has been presented to and reviewed with the patient, [name], and/or family member.  The patient and family have been given the opportunity to ask questions and make suggestions.  [name] 02/15/2015, 11:11 PM

## 2015-02-15 NOTE — ED Notes (Signed)
Patient resting quietly in room. No noted distress or abnormal behaviors noted. Will continue 15 minute checks and observation by security camera for safety. 

## 2015-02-15 NOTE — ED Notes (Signed)
Pt waiting to see psychiatrist. No complaints or distress noted. Maintained on 15 minute checks and observation by security camera for safety.

## 2015-02-15 NOTE — ED Notes (Signed)
Pt states she has been depressed and they have tried to adjust her medication without any success.. States her pyschiatrist Dr. Jimmye Norman told her to come to the ED for possible admission to stabilize her.. States she is having thoughts of harming herself, states she would just take all of her medication or driving off the road, or I wish I would fall asleep and never wake up..states she has a hx of overdose in the past..

## 2015-02-15 NOTE — ED Notes (Signed)
Pt has passive SI that "comes and goes." Pt contracts for safety on the unit. Denies HI/AVH and pain. Pt stated she has dealt with depression for many years and feels she has not found the right medications.  Cooperative. Pt's husband came to visit. No complaints or distress noted. Maintained on 15 minute checks and observation by security camera for safety.

## 2015-02-15 NOTE — Progress Notes (Signed)
Pt admitted to unit from ED. Pt is alert and oriented x4. Pt verbalizes passive SI at this time and contracts for safety. Denies HI/AVH. Pt c/o back pain rated a 10 out of 10 due to a fall she had on Monday after slipping on some ice. Pt states that she has been depressed for years and nothing seems to work. States "I don't want to be around anyone. I just want to sleep." Pt reports she had a suicide attempt 2 years ago which no one knew about until this past week. Pt lives with husband and 47 year old son. Pt states "My son is the only thing keeping me alive. I worry about what would happen to him if I were to kill myself." Skin assessment performed and no contraband found. Pt has 3 tattoos, 1 on her right chest, 1 on her left chest, and 1 on her upper back. A scar is noted on pt's neck from an aneurysm. Another scar is noted on her upper back from a melanoma removal. Pt's feet are noted to be dry. Pt oriented to unit and provided with hygiene supplies. q15 minute safety checks maintained. Pt remains free from harm.

## 2015-02-15 NOTE — BH Assessment (Signed)
Tele Assessment Note   [name] is an 45 y.o. female. Patient walked-in to the ED as she reports directed by Dr. Jimmye Norman her psychiatrist because of suicidal thoughts with a plan to overdose.  Patient continues to endorse suicidal thoughts with a plan.  Patient reports 2 years ago overdosing on her personal medications and was taken to the emergency department for diabetic coma but never informed anyone she intentionally overdosed.  Patient reports she needs some help. Patient denies homicidal thoughts, A/HV, and other self-injurious behaviors.   This Probation officer consulted with Dr. Weber Cooks it is recommended to refer for inpatient hospitalization for safety.    Diagnosis: Major Depressive Disorder, severe w/o psychosis  Past Medical History:  Past Medical History  Diagnosis Date  . Colon polyp   . GERD (gastroesophageal reflux disease)   . CAD (coronary artery disease)     a. cath 08/09/2003: mild LAD plaque, no obs dzs noted; b. 12/18/10 stress test w/o ischemia; c. cath 11/2014: LM: nl, mid 85-90% s/p PCI/DES, mid LCX 30%, RCA nl  . CVA (cerebral vascular accident) ([name])     a. 06/2008-TEE nl LV fxn; b. 01/2013 - notes indicate short run of SVT at that time  . Multiple sclerosis ([name]) 2015  . Syncope   . Chronic systolic CHF (congestive heart failure) ([name])     a. echo 01/29/2013 EF 40-45%, basal anteroseptal & mid anteroseptal segments abnl; b echo 05/2012 EF 50-55%, mildly dilated LA, nl RVSP  . Orthostatic hypotension   . Aortic arch aneurysm ([name])   . Ischemic cardiomyopathy   . MS (mitral stenosis)   . Malignant melanoma of skin of scalp ([name])   . Hypertension   . Depression   . Diabetes mellitus without complication ([place])   . Headache     Migrains  . Hyperlipidemia   . CKD (chronic kidney disease), stage IV ([name])   . Hypothyroidism   . Anxiety   . Gastric ulcer 04/27/2011  . Heart attack [name])     Past Surgical History  Procedure Laterality Date  . Appendectomy    .  Right oophorectomy    . Tubal ligation      R tube removed still w/ Left  . Dilation and curettage of uterus    . Gastric bypass  09/2009    [facility]   . Esophagogastroduodenoscopy (egd) with propofol N/A 09/14/2014    Procedure: ESOPHAGOGASTRODUODENOSCOPY (EGD) WITH PROPOFOL;  Surgeon: Kendra Class, MD;  Location: [name] ENDOSCOPY;  Service: Endoscopy;  Laterality: N/A;  . Cardiac catheterization N/A 11/09/2014    Procedure: Coronary Angiography;  Surgeon: Kendra Merritts, MD;  Location: [place] CV LAB;  Service: Cardiovascular;  Laterality: N/A;  . Cardiac catheterization N/A 11/12/2014    Procedure: Coronary Stent Intervention;  Surgeon: Kendra Cowman, MD;  Location: [place] CV LAB;  Service: Cardiovascular;  Laterality: N/A;  . Trigger finger release    . Melanoma excision  2016    Dr. Evorn [name]    Family History:  Family History  Problem Relation Age of Onset  . Hypertension Mother   . Anxiety disorder Mother   . Depression Mother   . Bipolar disorder Mother   . Heart disease Mother   . Hyperlipidemia Mother   . Kidney disease Father   . Heart disease Father   . Hypertension Father   . Diabetes Father   . Stroke Father   . Colon cancer Father     dx in  his 20's  . Anxiety disorder Father   . Depression Father   . Skin cancer Father   . Kidney disease Sister   . Thyroid nodules Sister   . Hypertension Sister   . Hypertension Sister   . Diabetes Sister   . Hyperlipidemia Sister   . Depression Sister     Social History:  reports that she quit smoking about 20 years ago. Her smoking use included Cigarettes. She has never used smokeless tobacco. She reports that she does not drink alcohol or use illicit drugs.  Additional Social History:  Alcohol / Drug Use Pain Medications: see chart  Prescriptions: see chart Over the Counter: see chart History of alcohol / drug use?: No history of alcohol / drug abuse  CIWA: CIWA-Ar BP: (!)  144/62 mmHg Pulse Rate: 76 COWS:    PATIENT STRENGTHS: (choose at least two) Capable of independent living Communication skills Supportive family/friends Work skills  Allergies:  Allergies  Allergen Reactions  . Tramadol Other (See Comments)    Reaction:  Burning of pts mouth and confusion     Home Medications:  (Not in a hospital admission)  OB/GYN Status:  No LMP recorded (approximate). Patient has had a hysterectomy.  General Assessment Data Location of Assessment: [name] ED TTS Assessment: In system Is this a Tele or Face-to-Face Assessment?: Tele Assessment Is this an Initial Assessment or a Re-assessment for this encounter?: Initial Assessment Marital status: Married [place] name: [name] Is patient pregnant?: No Pregnancy Status: No Living Arrangements: Spouse/significant other, Children Can pt return to current living arrangement?: Yes Admission Status: Voluntary Is patient capable of signing voluntary admission?: Yes Referral Source: Self/Family/Friend Insurance type: Insurance risk surveyor Exam ([place]) Medical Exam completed: Yes  Crisis Care Plan Living Arrangements: Spouse/significant other, Children Name of Psychiatrist: Dr. Jimmye Norman  Education Status Is patient currently in school?: No Highest grade of school patient has completed: some college  Risk to self with the past 6 months Suicidal Ideation: Yes-Currently Present Has patient been a risk to self within the past 6 months prior to admission? : Yes Suicidal Intent: Yes-Currently Present Has patient had any suicidal intent within the past 6 months prior to admission? : Yes Is patient at risk for suicide?: Yes Suicidal Plan?: Yes-Currently Present Has patient had any suicidal plan within the past 6 months prior to admission? : Yes Specify Current Suicidal Plan: overdose Access to Means: Yes Specify Access to Suicidal Means: personal medications What has been your use of drugs/alcohol  within the last 12 months?: denies Previous Attempts/Gestures: Yes How many times?: 1 Other Self Harm Risks: denies Triggers for Past Attempts: Unpredictable Intentional Self Injurious Behavior: None Family Suicide History: No Recent stressful life event(s): Trauma (Comment) Persecutory voices/beliefs?: No Depression: Yes Depression Symptoms: Despondent, Isolating, Fatigue, Guilt, Loss of interest in usual pleasures, Feeling worthless/self pity, Feeling angry/irritable (hopelessness) Substance abuse history and/or treatment for substance abuse?: No  Risk to Others within the past 6 months Homicidal Ideation: No-Not Currently/Within Last 6 Months Does patient have any lifetime risk of violence toward others beyond the six months prior to admission? : No Thoughts of Harm to Others: No-Not Currently Present/Within Last 6 Months Current Homicidal Intent: No-Not Currently/Within Last 6 Months Current Homicidal Plan: No-Not Currently/Within Last 6 Months Access to Homicidal Means: No Identified Victim: na History of harm to others?: No Assessment of Violence: None Noted Violent Behavior Description: na Does patient have access to weapons?: No Criminal Charges Pending?: No Does patient have  a court date: No Is patient on probation?: No  Psychosis Hallucinations: None noted Delusions: None noted  Mental Status Report Appearance/Hygiene: In hospital gown Eye Contact: Fair Motor Activity: Freedom of movement Speech: Logical/coherent Level of Consciousness: Alert Mood: Depressed, Anhedonia Affect: Depressed Anxiety Level: None Thought Processes: Coherent, Relevant Judgement: Unimpaired Orientation: Person, Place, Time, Situation Obsessive Compulsive Thoughts/Behaviors: None  Cognitive Functioning Concentration: Fair Memory: Recent Intact, Remote Intact IQ: Average Insight: Fair Impulse Control: Fair Appetite: Fair Weight Loss: 0 Weight Gain: 0 Sleep: Increased Total  Hours of Sleep: 9 Vegetative Symptoms: Staying in bed  ADLScreening [facility] Assessment Services) Patient's cognitive ability adequate to safely complete daily activities?: Yes Patient able to express need for assistance with ADLs?: Yes Independently performs ADLs?: Yes (appropriate for developmental age)  Prior Inpatient Therapy Prior Inpatient Therapy: No  Prior Outpatient Therapy Prior Outpatient Therapy: Yes Prior Therapy Dates: currently Prior Therapy Facilty/Provider(s): Dr. Jimmye Norman Reason for Treatment: [place] Does patient have an ACCT team?: No Does patient have Intensive In-House Services?  : No Does patient have [facility] services? : No Does patient have P4CC services?: No  ADL Screening (condition at time of admission) Patient's cognitive ability adequate to safely complete daily activities?: Yes Patient able to express need for assistance with ADLs?: Yes Independently performs ADLs?: Yes (appropriate for developmental age)       Abuse/Neglect Assessment (Assessment to be complete while patient is alone) Physical Abuse: Denies Verbal Abuse: Denies Sexual Abuse: Denies Exploitation of patient/patient's resources: Denies Self-Neglect: Denies Values / Beliefs Cultural Requests During Hospitalization: None Spiritual Requests During Hospitalization: None Consults Spiritual Care Consult Needed: No Social Work Consult Needed: No Regulatory affairs officer (For Healthcare) Does patient have an advance directive?: No Would patient like information on creating an advanced directive?: No - patient declined information    Additional Information 1:1 In Past 12 Months?: No CIRT Risk: No Elopement Risk: No Does patient have medical clearance?: Yes     Disposition:  Disposition Initial Assessment Completed for this Encounter: Yes Disposition of Patient: Inpatient treatment program Type of inpatient treatment program: Adult  Edmund Hilda 02/15/2015 4:56 PM

## 2015-02-15 NOTE — ED Provider Notes (Signed)
[facility] Emergency Department Provider Note  ____________________________________________  Time seen: Approximately 9:43 AM  I have reviewed the triage vital signs and the nursing notes.   HISTORY  Chief Complaint Suicidal    HPI [name] is a 45 y.o. female since for evaluation of severe depression. She reports she is on treatment and followed by Dr. Jimmye Norman of psychiatry. She reports despite frequent visits and treatment her depression symptoms continue to worsen to the point that she has been having thoughts of killing herself by overdose. She denies taking an overdose or any action because she cannot come brand her son finding her dad.  She does have a history of heart disease and previous stroke but denies any current or active symptoms. She has not been sick recently and feels like she is in her normal state of health. She has some chronic left-sided weakness secondary to her old stroke.  No headaches fevers chills.  Reports a previous suicide attempt 2 years ago which she did not report to her family where she attempted overdose.   Past Medical History  Diagnosis Date  . Colon polyp   . GERD (gastroesophageal reflux disease)   . CAD (coronary artery disease)     a. cath 08/09/2003: mild LAD plaque, no obs dzs noted; b. 12/18/10 stress test w/o ischemia; c. cath 11/2014: LM: nl, mid 85-90% s/p PCI/DES, mid LCX 30%, RCA nl  . CVA (cerebral vascular accident) ([employer])     a. 06/2008-TEE nl LV fxn; b. 01/2013 - notes indicate short run of SVT at that time  . Multiple sclerosis ([place]) 2015  . Syncope   . Chronic systolic CHF (congestive heart failure) ([place])     a. echo 01/29/2013 EF 40-45%, basal anteroseptal & mid anteroseptal segments abnl; b echo 05/2012 EF 50-55%, mildly dilated LA, nl RVSP  . Orthostatic hypotension   . Aortic arch aneurysm ([place])   . Ischemic cardiomyopathy   . MS (mitral stenosis)   . Malignant melanoma of skin of scalp  ([place])   . Hypertension   . Depression   . Diabetes mellitus without complication ([place])   . Headache     Migrains  . Hyperlipidemia   . CKD (chronic kidney disease), stage IV ([place])   . Hypothyroidism   . Anxiety   . Gastric ulcer 04/27/2011  . Heart attack [facility])     Patient Active Problem List   Diagnosis Date Noted  . Tachycardia 11/28/2014  . Helicobacter pylori infection 11/23/2014  . Hemiparesis, left ([place]) 11/23/2014  . Ischemic cardiomyopathy   . CAD (coronary artery disease)   . CKD (chronic kidney disease), stage IV ([place])   . Arthritis 11/20/2014  . Benign neoplasm of colon 11/20/2014  . Coronary artery disease involving native coronary artery of native heart with angina pectoris with documented spasm ([place])   . S/P coronary artery stent placement   . Ischemic chest pain ([place])   . Angina pectoris ([place])   . Malignant melanoma ([place]) 08/25/2014  . Chronic kidney disease (CKD), stage III (moderate) 08/25/2014  . Chronic systolic CHF (congestive heart failure) ([place])   . Urinary incontinence in female 07/12/2014  . Incomplete bladder emptying 07/12/2014  . Depression, major, recurrent, in partial remission ([place]) 05/09/2014  . Bipolar 1 disorder, depressed ([place]) 05/09/2014  . Left subclavian artery, anomalous origin 03/06/2014  . Adult hypothyroidism 12/30/2013  . Aberrant subclavian artery 11/17/2013  . Dysphagia lusoria 11/02/2013  . DS (disseminated sclerosis) ([place]) 11/02/2013  .  Multiple sclerosis ([place]) 11/02/2013  . Mechanical and motor problems with internal organs 07/31/2013  . Cerebrovascular accident (CVA) ([place]) 06/20/2013  . Personal history of urinary disorder 05/30/2013  . Headache, migraine 05/29/2013  . Diabetes ([place]) 05/29/2013  . Hyperlipidemia   . Obesity   . GERD (gastroesophageal reflux disease)   . Family hx of colon cancer 03/20/2011  . Palpitations 01/26/2011  . Neuropathy ([place]) 01/02/2011  . Insomnia 08/03/2010  . LUMBAR RADICULOPATHY  12/02/2009  . Bariatric surgery status 09/16/2009  . DYSURIA 03/11/2009  . Hypersomnia 12/05/2008  . CVA (cerebral vascular accident) ([place]) 06/21/2008  . Diabetes mellitus type 2 with complications, uncontrolled ([place]) 05/01/2008  . ANXIETY 05/01/2008  . Essential hypertension 05/01/2008    Past Surgical History  Procedure Laterality Date  . Appendectomy    . Right oophorectomy    . Tubal ligation      R tube removed still w/ Left  . Dilation and curettage of uterus    . Gastric bypass  09/2009    [facility]   . Esophagogastroduodenoscopy (egd) with propofol N/A 09/14/2014    Procedure: ESOPHAGOGASTRODUODENOSCOPY (EGD) WITH PROPOFOL;  Surgeon: Josefine Class, MD;  Location[facility] ENDOSCOPY;  Service: Endoscopy;  Laterality: N/A;  . Cardiac catheterization N/A 11/09/2014    Procedure: Coronary Angiography;  Surgeon: Minna Merritts, MD;  Location: [place] CV LAB;  Service: Cardiovascular;  Laterality: N/A;  . Cardiac catheterization N/A 11/12/2014    Procedure: Coronary Stent Intervention;  Surgeon: Isaias Cowman, MD;  Location: [place] CV LAB;  Service: Cardiovascular;  Laterality: N/A;  . Trigger finger release    . Melanoma excision  2016    Dr. Evorn Gong    Current Outpatient Rx  Name  Route  Sig  Dispense  Refill  . ARIPiprazole (ABILIFY) 2 MG tablet   Oral   Take 1 tablet (2 mg total) by mouth every morning.   30 tablet   1   . canagliflozin (INVOKANA) 300 MG TABS tablet   Oral   Take 300 mg by mouth daily.         . ciprofloxacin (CIPRO) 500 MG tablet   Oral   Take 1 tablet (500 mg total) by mouth 2 (two) times daily.   20 tablet   0   . citalopram (CELEXA) 40 MG tablet   Oral   Take 1 tablet (40 mg total) by mouth daily.   30 tablet   3   . diltiazem (CARDIZEM CD) 120 MG 24 hr capsule   Oral   Take 1 capsule (120 mg total) by mouth daily.   30 capsule   11   . levothyroxine (SYNTHROID, LEVOTHROID) 25 MCG tablet    Oral   Take 25 mcg by mouth daily before breakfast.         . metFORMIN (GLUCOPHAGE) 1000 MG tablet      TAKE ONE TABLET BY MOUTH TWICE DAILY   60 tablet   12   . pantoprazole (PROTONIX) 40 MG tablet   Oral   Take 40 mg by mouth 2 (two) times daily.          . pregabalin (LYRICA) 100 MG capsule   Oral   Take 1 capsule (100 mg total) by mouth 2 (two) times daily.   60 capsule   5   . traZODone (DESYREL) 100 MG tablet   Oral   Take 1 tablet (100 mg total) by mouth at bedtime as needed for sleep.  30 tablet   0   . carvedilol (COREG) 25 MG tablet   Oral   Take 1 tablet (25 mg total) by mouth 2 (two) times daily. Patient not taking: Reported on 02/15/2015   60 tablet   6   . clopidogrel (PLAVIX) 75 MG tablet   Oral   Take 1 tablet (75 mg total) by mouth daily. Patient not taking: Reported on 02/15/2015   30 tablet   5   . fluconazole (DIFLUCAN) 150 MG tablet   Oral   Take 1 tablet (150 mg total) by mouth once. Patient not taking: Reported on 02/15/2015   1 tablet   0   . isosorbide mononitrate (IMDUR) 30 MG 24 hr tablet   Oral   Take 1 tablet (30 mg total) by mouth daily. Patient not taking: Reported on 02/15/2015   30 tablet   5   . rosuvastatin (CRESTOR) 20 MG tablet   Oral   Take 1 tablet (20 mg total) by mouth daily at 6 PM. Patient not taking: Reported on 02/15/2015   30 tablet   0     Allergies Tramadol  Family History  Problem Relation Age of Onset  . Hypertension Mother   . Anxiety disorder Mother   . Depression Mother   . Bipolar disorder Mother   . Heart disease Mother   . Hyperlipidemia Mother   . Kidney disease Father   . Heart disease Father   . Hypertension Father   . Diabetes Father   . Stroke Father   . Colon cancer Father     dx in his 31's  . Anxiety disorder Father   . Depression Father   . Skin cancer Father   . Kidney disease Sister   . Thyroid nodules Sister   . Hypertension Sister   . Hypertension Sister   .  Diabetes Sister   . Hyperlipidemia Sister   . Depression Sister     Social History Social History  Substance Use Topics  . Smoking status: Former Smoker    Types: Cigarettes    Quit date: 08/31/1994  . Smokeless tobacco: Never Used     Comment: quit 28 years ago  . Alcohol Use: No    Review of Systems Constitutional: No fever/chills Eyes: No visual changes. ENT: No sore throat. Cardiovascular: Denies chest pain. Respiratory: Denies shortness of breath. Gastrointestinal: No abdominal pain.  No nausea, no vomiting.  No diarrhea.  No constipation. Genitourinary: Negative for dysuria. Musculoskeletal: Negative for back pain. Skin: Negative for rash. Neurological: Negative for headaches, focal weakness or numbness.  10-point ROS otherwise negative.  ____________________________________________   PHYSICAL EXAM:  VITAL SIGNS: ED Triage Vitals  Enc Vitals Group     BP 02/15/15 0903 159/77 mmHg     Pulse Rate 02/15/15 0903 74     Resp 02/15/15 0903 18     Temp 02/15/15 0903 98.1 F (36.7 C)     Temp Source 02/15/15 0903 Oral     SpO2 02/15/15 0903 100 %     Weight 02/15/15 0903 180 lb (81.647 kg)     Height 02/15/15 0903 5\' 2"  (1.575 m)     Head Cir --      Peak Flow --      Pain Score --      Pain Loc --      Pain Edu? --      Excl. in [place]? --    Constitutional: Alert and oriented. Well appearing and in no  acute distress. Amicable, soft-spoken and somewhat flat affect it. Eyes: Conjunctivae are normal. PERRL. EOMI. Head: Atraumatic. Nose: No congestion/rhinnorhea. Mouth/Throat: Mucous membranes are moist.  Oropharynx non-erythematous. Neck: No stridor.   Cardiovascular: Normal rate, regular rhythm. Grossly normal heart sounds.  Good peripheral circulation. Respiratory: Normal respiratory effort.  No retractions. Lungs CTAB. Gastrointestinal: Soft and nontender. No distention. No abdominal bruits. No CVA tenderness. Musculoskeletal: No lower extremity tenderness  nor edema.  No joint effusions. Neurologic:  Normal speech and language. No gross focal neurologic deficits are appreciated except for some very minimal left-sided weakness which the patient reports is chronic since a previous stroke. No evidence of acute deficit. Skin:  Skin is warm, dry and intact. No rash noted. Psychiatric: Mood and affect are flat. ____________________________________________   LABS (all labs ordered are listed, but only abnormal results are displayed)  Labs Reviewed  COMPREHENSIVE METABOLIC PANEL - Abnormal; Notable for the following:    Glucose, Bld 116 (*)    Creatinine, Ser 1.61 (*)    GFR calc non Af Amer 37 (*)    GFR calc Af Amer 43 (*)    All other components within normal limits  ACETAMINOPHEN LEVEL - Abnormal; Notable for the following:    Acetaminophen (Tylenol), Serum <10 (*)    All other components within normal limits  CBC - Abnormal; Notable for the following:    Hemoglobin 11.1 (*)    MCV 69.8 (*)    MCH 21.6 (*)    MCHC 30.9 (*)    RDW 17.7 (*)    All other components within normal limits  URINE DRUG SCREEN, QUALITATIVE ([facility] ONLY) - Abnormal; Notable for the following:    Benzodiazepine, Ur Scrn POSITIVE (*)    All other components within normal limits  ETHANOL  SALICYLATE LEVEL   ____________________________________________  EKG   ____________________________________________  RADIOLOGY    ____________________________________________   PROCEDURES  Procedure(s) performed: None  Critical Care performed: No  ____________________________________________   INITIAL IMPRESSION / ASSESSMENT AND PLAN / ED COURSE  Pertinent labs & imaging results that were available during my care of the patient were reviewed by me and considered in my medical decision making (see chart for details).  Patient transfer evaluation of worsening depressive symptoms for which she reports has been steadily progressing for the last several years. No  signs or symptoms to suggest need for emergent CT imaging or brain imaging. No infectious signs or symptoms. We'll place the patient on her home medications and request psychiatry consultation. ____________________________________________   FINAL CLINICAL IMPRESSION(S) / ED DIAGNOSES  Final diagnoses:  Episode of recurrent major depressive disorder, unspecified depression episode severity (HCC)      Delman Kitten, MD 02/15/15 1550

## 2015-02-15 NOTE — Consult Note (Signed)
[facility] Consult   Reason for Consult:  Consult for this 45 year old woman with a history of recurrent severe depression who comes voluntarily to the emergency room seeking evaluation for suicidal thoughts Referring Physician:  Quale Patient Identification: [name] MRN:  [id number] Principal Diagnosis: Severe recurrent major depression without psychotic features [facility]) Diagnosis:   Patient Active Problem List   Diagnosis Date Noted  . Severe recurrent major depression without psychotic features ([place]) [F33.2] 02/15/2015  . Suicidal ideation [R45.851] 02/15/2015  . Coronary artery disease [I25.10] 02/15/2015  . Tachycardia [R00.0] 11/28/2014  . Helicobacter pylori infection [B96.81] 11/23/2014  . Hemiparesis, left ([place]) [G81.94] 11/23/2014  . Ischemic cardiomyopathy [I25.5]   . CAD (coronary artery disease) [I25.10]   . CKD (chronic kidney disease), stage IV ([place]) [N18.4]   . Arthritis [M19.90] 11/20/2014  . Benign neoplasm of colon [D12.6] 11/20/2014  . Coronary artery disease involving native coronary artery of native heart with angina pectoris with documented spasm ([place]) [I25.111]   . S/P coronary artery stent placement [Z95.5]   . Ischemic chest pain ([place]) [I20.9]   . Angina pectoris ([place]) [I20.9]   . Malignant melanoma ([place]) [C43.9] 08/25/2014  . Chronic kidney disease (CKD), stage III (moderate) [N18.3] 08/25/2014  . Chronic systolic CHF (congestive heart failure) ([place]) [I50.22]   . Urinary incontinence in female [R32] 07/12/2014  . Incomplete bladder emptying [R33.9] 07/12/2014  . Depression, major, recurrent, in partial remission ([place]) [F33.41] 05/09/2014  . Bipolar 1 disorder, depressed ([place]) [F31.9] 05/09/2014  . Left subclavian artery, anomalous origin [Q25.48] 03/06/2014  . Adult hypothyroidism [E03.9] 12/30/2013  . Aberrant subclavian artery [Q27.8] 11/17/2013  . Dysphagia lusoria [Q27.9] 11/02/2013  . DS (disseminated sclerosis) ([place]) [G35]  11/02/2013  . Multiple sclerosis ([place]) [G35] 11/02/2013  . Mechanical and motor problems with internal organs [R68.89] 07/31/2013  . Cerebrovascular accident (CVA) ([place]) [I63.9] 06/20/2013  . Personal history of urinary disorder [Z87.448] 05/30/2013  . Headache, migraine [G43.909] 05/29/2013  . Diabetes ([place]) [E11.9] 05/29/2013  . Hyperlipidemia [E78.5]   . Obesity [E66.9]   . GERD (gastroesophageal reflux disease) [K21.9]   . Family hx of colon cancer [Z80.0] 03/20/2011  . Palpitations [R00.2] 01/26/2011  . Neuropathy ([place]) [G62.9] 01/02/2011  . Insomnia [G47.00] 08/03/2010  . LUMBAR RADICULOPATHY [IMO0002] 12/02/2009  . Bariatric surgery status [Z98.84] 09/16/2009  . DYSURIA [R30.0] 03/11/2009  . Hypersomnia [G47.10] 12/05/2008  . CVA (cerebral vascular accident) ([place]) [I63.9] 06/21/2008  . Diabetes mellitus type 2 with complications, uncontrolled ([place]) [E11.8, E11.65] 05/01/2008  . ANXIETY [F41.1] 05/01/2008  . Essential hypertension [I10] 05/01/2008    Total Time spent with patient: 1 hour  Subjective:   Chaz [address] [name] is a 45 y.o. female patient admitted with "I just feel like nothing is working".  HPI:  Patient interviewed. Chart reviewed. Old notes reviewed. Labs reviewed. Discussed case with her outpatient psychiatric provider. This 45 year old woman reports that she's had problems with depression probably going back as much as 10 years or more. It has been getting worse recently. Her mood feels down and sad and negative pre-much all the time. She is consumed by negative thoughts about herself. Feels excessive guilt. She is taking medicine to help her sleep but despite that still wakes up frequently at night. Feels like she has no enjoyment in things. Finds himself frequently having suicidal thoughts. Has recently been thinking about overdosing on her medicine or cutting herself. Patient is compliant with her psychiatric treatment through her outpatient doctor, Dr. Jimmye Norman.  She denies that she is drinking or abusing any drugs. Patient is not working. Seems to have an adequate relationship with her family but still feels guilty all the time. She feels bad about her medical problems and the fact that she is not working. Patient denies she's having any actual hallucinations. No homicidal ideation.  Social history: Lives with her husband and 54 year old son. She and her husband been together for about 5 years. She is not currently working outside the home.  Medical history: Multiple medical problems including coronary artery disease and what she reports as a history of myocardial infarction, history of stroke, history of renal impairment, diabetes, history of migraines. Patient is on multiple medications.  Substance abuse history: Denies alcohol or substance abuse denies any past significant substance abuse problems  Past Psychiatric History: Patient has a long history of depression but says she went for many years not getting any treatment. Did talk to her primary care doctor. Has been seeing Dr. Jimmye Norman for about a year. She says she has 1 prior suicide attempt but it was not recognized as such the time and she has never been admitted to a psychiatric hospital. Dr. Jimmye Norman has prescribed multiple medicines for her including Seroquel and lithium without any clear benefit. Patient apparently has been considered to possibly have bipolar disorder although she is not describing a manic episode.  Risk to Self: Is patient at risk for suicide?: Yes Risk to Others:   Prior Inpatient Therapy:   Prior Outpatient Therapy:    Past Medical History:  Past Medical History  Diagnosis Date  . Colon polyp   . GERD (gastroesophageal reflux disease)   . CAD (coronary artery disease)     a. cath 08/09/2003: mild LAD plaque, no obs dzs noted; b. 12/18/10 stress test w/o ischemia; c. cath 11/2014: LM: nl, mid 85-90% s/p PCI/DES, mid LCX 30%, RCA nl  . CVA (cerebral vascular accident) ([place])      a. 06/2008-TEE nl LV fxn; b. 01/2013 - notes indicate short run of SVT at that time  . Multiple sclerosis ([place]) 2015  . Syncope   . Chronic systolic CHF (congestive heart failure) ([place])     a. echo 01/29/2013 EF 40-45%, basal anteroseptal & mid anteroseptal segments abnl; b echo 05/2012 EF 50-55%, mildly dilated LA, nl RVSP  . Orthostatic hypotension   . Aortic arch aneurysm ([place])   . Ischemic cardiomyopathy   . MS (mitral stenosis)   . Malignant melanoma of skin of scalp ([place])   . Hypertension   . Depression   . Diabetes mellitus without complication ([place])   . Headache     Migrains  . Hyperlipidemia   . CKD (chronic kidney disease), stage IV ([place])   . Hypothyroidism   . Anxiety   . Gastric ulcer 04/27/2011  . Heart attack [facility])     Past Surgical History  Procedure Laterality Date  . Appendectomy    . Right oophorectomy    . Tubal ligation      R tube removed still w/ Left  . Dilation and curettage of uterus    . Gastric bypass  09/2009    [facility]   . Esophagogastroduodenoscopy (egd) with propofol N/A 09/14/2014    Procedure: ESOPHAGOGASTRODUODENOSCOPY (EGD) WITH PROPOFOL;  Surgeon: Josefine Class, MD;  Location: [facility] ENDOSCOPY;  Service: Endoscopy;  Laterality: N/A;  . Cardiac catheterization N/A 11/09/2014    Procedure: Coronary Angiography;  Surgeon: Minna Merritts, MD;  Location: [facility]  CV LAB;  Service: Cardiovascular;  Laterality: N/A;  . Cardiac catheterization N/A 11/12/2014    Procedure: Coronary Stent Intervention;  Surgeon: Isaias Cowman, MD;  Location: [facility] CV LAB;  Service: Cardiovascular;  Laterality: N/A;  . Trigger finger release    . Melanoma excision  2016    Dr. Evorn Gong   Family History:  Family History  Problem Relation Age of Onset  . Hypertension Mother   . Anxiety disorder Mother   . Depression Mother   . Bipolar disorder Mother   . Heart disease Mother   . Hyperlipidemia Mother   . Kidney disease  Father   . Heart disease Father   . Hypertension Father   . Diabetes Father   . Stroke Father   . Colon cancer Father     dx in his 74's  . Anxiety disorder Father   . Depression Father   . Skin cancer Father   . Kidney disease Sister   . Thyroid nodules Sister   . Hypertension Sister   . Hypertension Sister   . Diabetes Sister   . Hyperlipidemia Sister   . Depression Sister    Family Psychiatric  History: Patient reports that her mother has bipolar disorder and also drinks too much also her sister has a history of depression but also a head injury Social History:  History  Alcohol Use No     History  Drug Use No    Social History   Social History  . Marital Status: Married    Spouse Name: N/A  . Number of Children: 1  . Years of Education: N/A   Occupational History  . 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 Disabled through 08/02/2013 per forms 02/03/2013   Social History Main Topics  . Smoking status: Former Smoker    Types: Cigarettes    Quit date: 08/31/1994  . Smokeless tobacco: Never Used     Comment: quit 28 years ago  . Alcohol Use: No  . Drug Use: No  . Sexual Activity: Not Currently    Birth Control/ Protection: None   Other Topics Concern  . None   Social History Narrative   previously did custolial work. 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 Disabled through 08/02/2013 per forms 02/03/13   Additional Social History:                          Allergies:   Allergies  Allergen Reactions  . Tramadol Other (See Comments)    Reaction:  Burning of pts mouth and confusion     Labs:  Results for orders placed or performed during the hospital encounter of 02/15/15 (from the past 48 hour(s))  Comprehensive metabolic panel     Status: Abnormal   Collection Time: 02/15/15  9:07 AM  Result Value Ref Range   Sodium 136 135 - 145 mmol/L   Potassium  3.7 3.5 - 5.1 mmol/L   Chloride 104 101 - 111 mmol/L   CO2 24 22 - 32 mmol/L   Glucose, Bld 116 (H) 65 - 99 mg/dL   BUN 13 6 - 20 mg/dL   Creatinine, Ser 1.61 (H) 0.44 - 1.00 mg/dL   Calcium 9.0 8.9 - 10.3 mg/dL   Total Protein 7.3 6.5 - 8.1 g/dL   Albumin 3.9 3.5 - 5.0 g/dL   AST 16 15 -  41 U/L   ALT 14 14 - 54 U/L   Alkaline Phosphatase 81 38 - 126 U/L   Total Bilirubin 0.5 0.3 - 1.2 mg/dL   GFR calc non Af Amer 37 (L) >60 mL/min   GFR calc Af Amer 43 (L) >60 mL/min    Comment: (NOTE) The eGFR has been calculated using the CKD EPI equation. This calculation has not been validated in all clinical situations. eGFR's persistently <60 mL/min signify possible Chronic Kidney Disease.    Anion gap 8 5 - 15  Ethanol (ETOH)     Status: None   Collection Time: 02/15/15  9:07 AM  Result Value Ref Range   Alcohol, Ethyl (B) <5 <5 mg/dL    Comment:        LOWEST DETECTABLE LIMIT FOR SERUM ALCOHOL IS 5 mg/dL FOR MEDICAL PURPOSES ONLY   Salicylate level     Status: None   Collection Time: 02/15/15  9:07 AM  Result Value Ref Range   Salicylate Lvl <3.5 2.8 - 30.0 mg/dL  Acetaminophen level     Status: Abnormal   Collection Time: 02/15/15  9:07 AM  Result Value Ref Range   Acetaminophen (Tylenol), Serum <10 (L) 10 - 30 ug/mL    Comment:        THERAPEUTIC CONCENTRATIONS VARY SIGNIFICANTLY. A RANGE OF 10-30 ug/mL MAY BE AN EFFECTIVE CONCENTRATION FOR MANY PATIENTS. HOWEVER, SOME ARE BEST TREATED AT CONCENTRATIONS OUTSIDE THIS RANGE. ACETAMINOPHEN CONCENTRATIONS >150 ug/mL AT 4 HOURS AFTER INGESTION AND >50 ug/mL AT 12 HOURS AFTER INGESTION ARE OFTEN ASSOCIATED WITH TOXIC REACTIONS.   CBC     Status: Abnormal   Collection Time: 02/15/15  9:07 AM  Result Value Ref Range   WBC 8.6 3.6 - 11.0 K/uL   RBC 5.14 3.80 - 5.20 MIL/uL   Hemoglobin 11.1 (L) 12.0 - 16.0 g/dL   HCT 35.9 35.0 - 47.0 %   MCV 69.8 (L) 80.0 - 100.0 fL   MCH 21.6 (L) 26.0 - 34.0 pg   MCHC 30.9 (L) 32.0 -  36.0 g/dL   RDW 17.7 (H) 11.5 - 14.5 %   Platelets 202 150 - 440 K/uL  Urine Drug Screen, Qualitative ([facility] only)     Status: Abnormal   Collection Time: 02/15/15 11:22 AM  Result Value Ref Range   Tricyclic, Ur Screen NONE DETECTED NONE DETECTED   Amphetamines, Ur Screen NONE DETECTED NONE DETECTED   MDMA (Ecstasy)Ur Screen NONE DETECTED NONE DETECTED   Cocaine Metabolite,Ur [place] NONE DETECTED NONE DETECTED   Opiate, Ur Screen NONE DETECTED NONE DETECTED   Phencyclidine (PCP) Ur S NONE DETECTED NONE DETECTED   Cannabinoid 50 Ng, Ur [place] NONE DETECTED NONE DETECTED   Barbiturates, Ur Screen NONE DETECTED NONE DETECTED   Benzodiazepine, Ur Scrn POSITIVE (A) NONE DETECTED   Methadone Scn, Ur NONE DETECTED NONE DETECTED    Comment: (NOTE) 573  Tricyclics, urine               Cutoff 1000 ng/mL 200  Amphetamines, urine             Cutoff 1000 ng/mL 300  MDMA (Ecstasy), urine           Cutoff 500 ng/mL 400  Cocaine Metabolite, urine       Cutoff 300 ng/mL 500  Opiate, urine                   Cutoff 300 ng/mL 600  Phencyclidine (PCP), urine  Cutoff 25 ng/mL 700  Cannabinoid, urine              Cutoff 50 ng/mL 800  Barbiturates, urine             Cutoff 200 ng/mL 900  Benzodiazepine, urine           Cutoff 200 ng/mL 1000 Methadone, urine                Cutoff 300 ng/mL 1100 1200 The urine drug screen provides only a preliminary, unconfirmed 1300 analytical test result and should not be used for non-medical 1400 purposes. Clinical consideration and professional judgment should 1500 be applied to any positive drug screen result due to possible 1600 interfering substances. A more specific alternate chemical method 1700 must be used in order to obtain a confirmed analytical result.  1800 Gas chromato graphy / mass spectrometry (GC/MS) is the preferred 1900 confirmatory method.     Current Facility-Administered Medications  Medication Dose Route Frequency Provider Last Rate Last Dose  .  ARIPiprazole (ABILIFY) tablet 2 mg  2 mg Oral Daily Gonzella Lex, MD      . aspirin EC tablet 81 mg  81 mg Oral Daily Delman Kitten, MD   81 mg at 02/15/15 1030  . baclofen (LIORESAL) tablet 10 mg  10 mg Oral TID PRN Delman Kitten, MD      . citalopram (CELEXA) tablet 40 mg  40 mg Oral Daily Delman Kitten, MD   40 mg at 02/15/15 1240  . diltiazem (CARDIZEM CD) 24 hr capsule 120 mg  120 mg Oral Daily Delman Kitten, MD   120 mg at 02/15/15 1443  . isosorbide mononitrate (IMDUR) 24 hr tablet 30 mg  30 mg Oral Daily Gonzella Lex, MD      . Derrill Memo ON 02/16/2015] levothyroxine (SYNTHROID, LEVOTHROID) tablet 25 mcg  25 mcg Oral QAC breakfast Delman Kitten, MD      . metFORMIN (GLUCOPHAGE) tablet 1,000 mg  1,000 mg Oral BID Delman Kitten, MD   1,000 mg at 02/15/15 1240  . pantoprazole (PROTONIX) EC tablet 40 mg  40 mg Oral BID Delman Kitten, MD   40 mg at 02/15/15 1241  . pregabalin (LYRICA) capsule 100 mg  100 mg Oral BID Delman Kitten, MD   100 mg at 02/15/15 1240  . traZODone (DESYREL) tablet 100 mg  100 mg Oral QHS PRN Delman Kitten, MD       Current Outpatient Prescriptions  Medication Sig Dispense Refill  . ARIPiprazole (ABILIFY) 2 MG tablet Take 1 tablet (2 mg total) by mouth every morning. 30 tablet 1  . canagliflozin (INVOKANA) 300 MG TABS tablet Take 300 mg by mouth daily.    . ciprofloxacin (CIPRO) 500 MG tablet Take 1 tablet (500 mg total) by mouth 2 (two) times daily. 20 tablet 0  . citalopram (CELEXA) 40 MG tablet Take 1 tablet (40 mg total) by mouth daily. 30 tablet 3  . diltiazem (CARDIZEM CD) 120 MG 24 hr capsule Take 1 capsule (120 mg total) by mouth daily. 30 capsule 11  . levothyroxine (SYNTHROID, LEVOTHROID) 25 MCG tablet Take 25 mcg by mouth daily before breakfast.    . metFORMIN (GLUCOPHAGE) 1000 MG tablet TAKE ONE TABLET BY MOUTH TWICE DAILY 60 tablet 12  . pantoprazole (PROTONIX) 40 MG tablet Take 40 mg by mouth 2 (two) times daily.     . pregabalin (LYRICA) 100 MG capsule Take 1 capsule (100 mg  total) by mouth 2 (two)  times daily. 60 capsule 5  . traZODone (DESYREL) 100 MG tablet Take 1 tablet (100 mg total) by mouth at bedtime as needed for sleep. 30 tablet 0  . carvedilol (COREG) 25 MG tablet Take 1 tablet (25 mg total) by mouth 2 (two) times daily. (Patient not taking: Reported on 02/15/2015) 60 tablet 6  . clopidogrel (PLAVIX) 75 MG tablet Take 1 tablet (75 mg total) by mouth daily. (Patient not taking: Reported on 02/15/2015) 30 tablet 5  . fluconazole (DIFLUCAN) 150 MG tablet Take 1 tablet (150 mg total) by mouth once. (Patient not taking: Reported on 02/15/2015) 1 tablet 0  . isosorbide mononitrate (IMDUR) 30 MG 24 hr tablet Take 1 tablet (30 mg total) by mouth daily. (Patient not taking: Reported on 02/15/2015) 30 tablet 5  . rosuvastatin (CRESTOR) 20 MG tablet Take 1 tablet (20 mg total) by mouth daily at 6 PM. (Patient not taking: Reported on 02/15/2015) 30 tablet 0    Musculoskeletal: Strength & Muscle Tone: within normal limits Gait & Station: normal Patient leans: N/A  Psychiatric Specialty Exam: Review of Systems  Constitutional: Positive for malaise/fatigue.  Eyes: Negative.   Respiratory: Negative.   Cardiovascular: Negative.   Gastrointestinal: Negative.   Musculoskeletal: Negative.   Skin: Negative.   Neurological: Positive for headaches.  Psychiatric/Behavioral: Positive for depression and suicidal ideas. Negative for hallucinations, memory loss and substance abuse. The patient is nervous/anxious and has insomnia.     Blood pressure 144/62, pulse 76, temperature 99.1 F (37.3 C), temperature source Oral, resp. rate 18, height _0  (1.575 m), weight 81.647 kg (180 lb), SpO2 99 %.Body mass index is 32.91 kg/(m^2).  General Appearance: Disheveled  Eye Sport and exercise psychologist::  Fair  Speech:  Slow  Volume:  Decreased  Mood:  Depressed  Affect:  Depressed  Thought Process:  Goal Directed  Orientation:  Full (Time, Place, and Person)  Thought Content:  Rumination  Suicidal  Thoughts:  Yes.  with intent/plan  Homicidal Thoughts:  No  Memory:  Immediate;   Fair Recent;   Fair Remote;   Fair  Judgement:  Fair  Insight:  Fair  Psychomotor Activity:  Decreased  Concentration:  Fair  Recall:  [employer] of Knowledge:Fair  Language: Fair  Akathisia:  No  Handed:  Right  AIMS (if indicated):     Assets:  Communication Skills Desire for Improvement Housing Resilience  ADL's:  Intact  Cognition: WNL  Sleep:      Treatment Plan Summary: Daily contact with patient to assess and evaluate symptoms and progress in treatment, Medication management and Plan 45 year old woman who presents with symptoms of major depression severe recurrent no obvious psychotic features. Suicidal ideation is an ongoing issue. She will be admitted to the psychiatric hospital and kept on close precautions for the time being. Depression will be treated with her current antidepressants including citalopram and Abilify. Treatment team on the unit can look into the possibility of any changes. Continue her on her blood pressure and cardiac medicines and medicines for diabetes. Recheck labs including hemoglobin A1c and lipid panel and prolactin level. I have left a message for her outpatient psychiatrist that she has been admitted to the hospital. Patient is agreeable to the plan.  Disposition: Recommend psychiatric Inpatient admission when medically cleared. Supportive therapy provided about ongoing stressors.  John Clapacs 02/15/2015 3:56 PM

## 2015-02-15 NOTE — Progress Notes (Signed)
This Probation officer spoke with Amy, RN, Kingsley Spittle, ED Secretary, and [place], ED Registration to inform them the patient was accepted to [facility] bed 303 by Dr. Weber Cooks. The attending psychiatrist is Dr. Jerilee Hoh.  The patient can transfer at 1800. The bed transfer sheet was faxed to each above named person.      Chesley Noon, MSW, Darlyn Read [facility] Triage Specialist [phone]

## 2015-02-16 ENCOUNTER — Inpatient Hospital Stay: Payer: BC Managed Care – PPO

## 2015-02-16 DIAGNOSIS — F332 Major depressive disorder, recurrent severe without psychotic features: Principal | ICD-10-CM

## 2015-02-16 LAB — BASIC METABOLIC PANEL
Anion gap: 9 (ref 5–15)
BUN: 19 mg/dL (ref 6–20)
CHLORIDE: 105 mmol/L (ref 101–111)
CO2: 24 mmol/L (ref 22–32)
Calcium: 9.4 mg/dL (ref 8.9–10.3)
Creatinine, Ser: 1.77 mg/dL — ABNORMAL HIGH (ref 0.44–1.00)
GFR calc non Af Amer: 33 mL/min — ABNORMAL LOW (ref >60)
GFR, EST AFRICAN AMERICAN: 39 mL/min — AB (ref >60)
Glucose, Bld: 196 mg/dL — ABNORMAL HIGH (ref 65–99)
POTASSIUM: 4.4 mmol/L (ref 3.5–5.1)
SODIUM: 138 mmol/L (ref 135–145)

## 2015-02-16 LAB — GLUCOSE, CAPILLARY
GLUCOSE-CAPILLARY: 94 mg/dL (ref 65–99)
GLUCOSE-CAPILLARY: 188 mg/dL — AB (ref 65–99)
GLUCOSE-CAPILLARY: 74 mg/dL (ref 65–99)
GLUCOSE-CAPILLARY: 135 mg/dL — AB (ref 65–99)
Glucose-Capillary: 106 mg/dL — ABNORMAL HIGH (ref 65–99)

## 2015-02-16 LAB — TROPONIN I

## 2015-02-16 LAB — VITAMIN B12: VITAMIN B 12: 275 pg/mL (ref 180–914)

## 2015-02-16 MED ORDER — NITROGLYCERIN 0.4 MG SL SUBL
0.4000 mg | SUBLINGUAL_TABLET | SUBLINGUAL | Status: DC | PRN
Start: 2015-02-16 — End: 2015-02-20
  Filled 2015-02-16: qty 1

## 2015-02-16 MED ORDER — CLOPIDOGREL BISULFATE 75 MG PO TABS
75.0000 mg | ORAL_TABLET | Freq: Every day | ORAL | Status: DC
  Administered 2015-02-16 – 2015-02-20 (×5): 75 mg via ORAL
  Filled 2015-02-16 (×6): qty 1

## 2015-02-16 MED ORDER — ROSUVASTATIN CALCIUM 20 MG PO TABS
20.0000 mg | ORAL_TABLET | Freq: Every day | ORAL | Status: DC
  Administered 2015-02-16 – 2015-02-19 (×4): 20 mg via ORAL
  Filled 2015-02-16 (×5): qty 1

## 2015-02-16 MED ORDER — INSULIN ASPART 100 UNIT/ML ~~LOC~~ SOLN: 0.0000 [IU] | Freq: Three times a day (TID) | SUBCUTANEOUS | Status: DC

## 2015-02-16 MED ORDER — CARVEDILOL 6.25 MG PO TABS
3.1250 mg | ORAL_TABLET | Freq: Two times a day (BID) | ORAL | Status: DC
  Administered 2015-02-16 – 2015-02-20 (×8): 3.125 mg via ORAL
  Filled 2015-02-16 (×8): qty 1

## 2015-02-16 MED ORDER — CANAGLIFLOZIN 100 MG PO TABS
300.0000 mg | ORAL_TABLET | Freq: Every day | ORAL | Status: DC
  Administered 2015-02-17 – 2015-02-20 (×4): 300 mg via ORAL
  Filled 2015-02-16 (×6): qty 3

## 2015-02-16 MED ORDER — CARVEDILOL 6.25 MG PO TABS
25.0000 mg | ORAL_TABLET | Freq: Two times a day (BID) | ORAL | Status: DC
  Filled 2015-02-16: qty 4

## 2015-02-16 MED ORDER — CANAGLIFLOZIN 300 MG PO TABS: 300.0000 mg | ORAL_TABLET | Freq: Every day | ORAL | Status: DC

## 2015-02-16 MED ORDER — ARIPIPRAZOLE 10 MG PO TABS
5.0000 mg | ORAL_TABLET | Freq: Every day | ORAL | Status: DC
  Administered 2015-02-17 – 2015-02-20 (×4): 5 mg via ORAL
  Filled 2015-02-16 (×4): qty 1

## 2015-02-16 MED ORDER — ISOSORBIDE MONONITRATE ER 30 MG PO TB24: 30.0000 mg | ORAL_TABLET | Freq: Every day | ORAL | Status: DC

## 2015-02-16 MED ORDER — TRAZODONE HCL 100 MG PO TABS
100.0000 mg | ORAL_TABLET | Freq: Every day | ORAL | Status: DC
  Administered 2015-02-16 – 2015-02-19 (×4): 100 mg via ORAL
  Filled 2015-02-16 (×4): qty 1
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
## 2015-02-16 NOTE — Consult Note (Signed)
[name]    MR#:  [id number]  DATE OF BIRTH:  [birth year]  DATE OF ADMISSION:  02/15/2015  PRIMARY CARE PHYSICIAN: [name], MD   REQUESTING/REFERRING PHYSICIAN: Dr.Hernanandez  CHIEF COMPLAINT:  No chief complaint on file.   HISTORY OF PRESENT ILLNESS:  [name]  is a 45 y.o. female with a known history of hypertension, coronary artery disease, status post recent stent 2 months ago comes in to psychiatric ward because of depression,/ we were called to see the patient because of episode of dizziness, fainting. She was started on Coreg, Cardizem today. Received dose of Coreg 25 mg, Cardizem 120 mg this morning. Around noontime she had a fainting episode. According to her the patient stopped taking Coreg, Cardizem,  Plavix 10 days ago because she says she is tired of taking the medication.any chest pain. Patient has some orthostatic the blood pressure changes, blood pressure lying down 163/82, standing it's 133/78.   PAST MEDICAL HISTORY:   Past Medical History  Diagnosis Date  . Colon polyp   . GERD (gastroesophageal reflux disease)   . CAD (coronary artery disease)     a. cath 08/09/2003: mild LAD plaque, no obs dzs noted; b. 12/18/10 stress test w/o ischemia; c. cath 11/2014: LM: nl, mid 85-90% s/p PCI/DES, mid LCX 30%, RCA nl  . CVA (cerebral vascular accident) ([place])     a. 06/2008-TEE nl LV fxn; b. 01/2013 - notes indicate short run of SVT at that time  . Multiple sclerosis ([place]) 2015  . Syncope   . Chronic systolic CHF (congestive heart failure) ([place])     a. echo 01/29/2013 EF 40-45%, basal anteroseptal & mid anteroseptal segments abnl; b echo 05/2012 EF 50-55%, mildly dilated LA, nl RVSP  . Orthostatic hypotension   . Aortic arch aneurysm ([place])   . Ischemic cardiomyopathy   . MS (mitral stenosis)   . Malignant melanoma of skin of scalp ([place])   . Hypertension   . Depression   .  Diabetes mellitus without complication ([place])   . Headache     Migrains  . Hyperlipidemia   . CKD (chronic kidney disease), stage IV ([name])   . Hypothyroidism   . Anxiety   . Gastric ulcer 04/27/2011  . Heart attack ([place])     PAST SURGICAL HISTOIRY:   Past Surgical History  Procedure Laterality Date  . Appendectomy    . Right oophorectomy    . Tubal ligation      R tube removed still w/ Left  . Dilation and curettage of uterus    . Gastric bypass  09/2009    [facility]   . Esophagogastroduodenoscopy (egd) with propofol N/A 09/14/2014    Procedure: ESOPHAGOGASTRODUODENOSCOPY (EGD) WITH PROPOFOL;  Surgeon: Kendra Class, MD;  Location: [facility] ENDOSCOPY;  Service: Endoscopy;  Laterality: N/A;  . Cardiac catheterization N/A 11/09/2014    Procedure: Coronary Angiography;  Surgeon: Kendra Merritts, MD;  Location: [facility] CV LAB;  Service: Cardiovascular;  Laterality: N/A;  . Cardiac catheterization N/A 11/12/2014    Procedure: Coronary Stent Intervention;  Surgeon: Kendra Cowman, MD;  Location: [facility] CV LAB;  Service: Cardiovascular;  Laterality: N/A;  . Trigger finger release    . Melanoma excision  2016    Dr. Evorn Gong    SOCIAL HISTORY:   Social History  Substance Use Topics  . Smoking status: Former Smoker  Types: Cigarettes    Quit date: 08/31/1994  . Smokeless tobacco: Never Used     Comment: quit 28 years ago  . Alcohol Use: No    FAMILY HISTORY:   Family History  Problem Relation Age of Onset  . Hypertension Mother   . Anxiety disorder Mother   . Depression Mother   . Bipolar disorder Mother   . Heart disease Mother   . Hyperlipidemia Mother   . Kidney disease Father   . Heart disease Father   . Hypertension Father   . Diabetes Father   . Stroke Father   . Colon cancer Father     dx in his 24's  . Anxiety disorder Father   . Depression Father   . Skin cancer Father   . Kidney disease Sister   . Thyroid nodules  Sister   . Hypertension Sister   . Hypertension Sister   . Diabetes Sister   . Hyperlipidemia Sister   . Depression Sister     DRUG ALLERGIES:   Allergies  Allergen Reactions  . Tramadol Other (See Comments)    Reaction:  Burning of pts mouth and confusion     REVIEW OF SYSTEMS:  CONSTITUTIONAL: No fever, fatigue or weakness.  she feels dizzy.Marland Kitchen  EYES: No blurred or double vision.  EARS, NOSE, AND THROAT: No tinnitus or ear pain.  RESPIRATORY: No cough, shortness of breath, wheezing or hemoptysis.  CARDIOVASCULAR: No chest pain, orthopnea, edema.  GASTROINTESTINAL: No nausea, vomiting, diarrhea or abdominal pain.  GENITOURINARY: No dysuria, hematuria.  ENDOCRINE: No polyuria, nocturia,  HEMATOLOGY: No anemia, easy bruising or bleeding SKIN: No rash or lesion. MUSCULOSKELETAL: No joint pain or arthritis.   NEUROLOGIC: No tingling, numbness, weakness.  PSYCHIATRY: No anxiety or depression.   MEDICATIONS AT HOME:   Prior to Admission medications   Medication Sig Start Date End Date Taking? Authorizing Provider  ARIPiprazole (ABILIFY) 2 MG tablet Take 1 tablet (2 mg total) by mouth every morning. 02/07/15   Kendra Skiff, MD  canagliflozin (INVOKANA) 300 MG TABS tablet Take 300 mg by mouth daily.    Historical Provider, MD  carvedilol (COREG) 25 MG tablet Take 1 tablet (25 mg total) by mouth 2 (two) times daily. Patient not taking: Reported on 02/15/2015 11/23/14   Kendra Haber Dunn, PA-C  ciprofloxacin (CIPRO) 500 MG tablet Take 1 tablet (500 mg total) by mouth 2 (two) times daily. 02/07/15 02/17/15  Kendra Sons, MD  citalopram (CELEXA) 40 MG tablet Take 1 tablet (40 mg total) by mouth daily. 12/05/14   Kendra Skiff, MD  clopidogrel (PLAVIX) 75 MG tablet Take 1 tablet (75 mg total) by mouth daily. Patient not taking: Reported on 02/15/2015 11/23/14   Kendra Haber Dunn, PA-C  diltiazem (CARDIZEM CD) 120 MG 24 hr capsule Take 1 capsule (120 mg total) by mouth daily. 01/07/15   Kendra Merritts, MD  fluconazole (DIFLUCAN) 150 MG tablet Take 1 tablet (150 mg total) by mouth once. Patient not taking: Reported on 02/15/2015 01/07/15   Kendra Sons, MD  isosorbide mononitrate (IMDUR) 30 MG 24 hr tablet Take 1 tablet (30 mg total) by mouth daily. Patient not taking: Reported on 02/15/2015 11/23/14   Kendra Haber Dunn, PA-C  levothyroxine (SYNTHROID, LEVOTHROID) 25 MCG tablet Take 25 mcg by mouth daily before breakfast.    Historical Provider, MD  metFORMIN (GLUCOPHAGE) 1000 MG tablet TAKE ONE TABLET BY MOUTH TWICE DAILY 02/12/15   Kendra Sons, MD  pantoprazole (  PROTONIX) 40 MG tablet Take 40 mg by mouth 2 (two) times daily.     Historical Provider, MD  pregabalin (LYRICA) 100 MG capsule Take 1 capsule (100 mg total) by mouth 2 (two) times daily. 01/03/15   Kendra Sons, MD  rosuvastatin (CRESTOR) 20 MG tablet Take 1 tablet (20 mg total) by mouth daily at 6 PM. Patient not taking: Reported on 02/15/2015 11/13/14   Hillary Bow, MD  traZODone (DESYREL) 100 MG tablet Take 1 tablet (100 mg total) by mouth at bedtime as needed for sleep. 02/13/15   Kendra Skiff, MD      VITAL SIGNS:  Blood pressure 113/76, pulse 87, temperature 98.2 F (36.8 C), temperature source Oral, resp. rate 20, height 5\' 2"  (1.575 m), weight 81.647 kg (180 lb).  PHYSICAL EXAMINATION:  GENERAL:  45 y.o.-year-old patient lying in the bed with no acute distress.  EYES: Pupils equal, round, reactive to light and accommodation. No scleral icterus. Extraocular muscles intact.  HEENT: Head atraumatic, normocephalic. Oropharynx and nasopharynx clear.  NECK:  Supple, no jugular venous distention. No thyroid enlargement, no tenderness.  LUNGS: Normal breath sounds bilaterally, no wheezing, rales,rhonchi or crepitation. No use of accessory muscles of respiration.  CARDIOVASCULAR: S1, S2 normal. No murmurs, rubs, or gallops.  ABDOMEN: Soft, nontender, nondistended. Bowel sounds present. No organomegaly or mass.   EXTREMITIES: No pedal edema, cyanosis, or clubbing.  NEUROLOGIC: Cranial nerves II through XII are intact. Muscle strength 5/5 in all extremities. Sensation intact. Gait not checked.  PSYCHIATRIC: The patient is alert and oriented x 3.  SKIN: No obvious rash, lesion, or ulcer.   LABORATORY PANEL:   CBC  Recent Labs Lab 02/15/15 0907  WBC 8.6  HGB 11.1*  HCT 35.9  PLT 202   ------------------------------------------------------------------------------------------------------------------  Chemistries   Recent Labs Lab 02/15/15 0907  NA 136  K 3.7  CL 104  CO2 24  GLUCOSE 116*  BUN 13  CREATININE 1.61*  CALCIUM 9.0  AST 16  ALT 14  ALKPHOS 81  BILITOT 0.5   ------------------------------------------------------------------------------------------------------------------  Cardiac Enzymes No results for input(s): TROPONINI in the last 168 hours. ------------------------------------------------------------------------------------------------------------------  RADIOLOGY:  Dg Chest 2 View  02/16/2015  CLINICAL DATA:  Preop clearance. EXAM: CHEST  2 VIEW COMPARISON:  None. FINDINGS: Normal mediastinum and cardiac silhouette. Normal pulmonary vasculature. No evidence of effusion, infiltrate, or pneumothorax. No acute bony abnormality. Surgical clips along the LEFT thyroid bed. IMPRESSION: No acute cardiopulmonary process. Electronically Signed   By: Suzy Bouchard M.D.   On: 02/16/2015 12:55   Ct Head Wo Contrast  02/16/2015  CLINICAL DATA:  Depression. Previous stroke. Preprocedure evaluation for electroconvulsive therapy. EXAM: CT HEAD WITHOUT CONTRAST TECHNIQUE: Contiguous axial images were obtained from the base of the skull through the vertex without intravenous contrast. COMPARISON:  06/03/2014 FINDINGS: There is no evidence of intracranial hemorrhage, brain edema, or other signs of acute infarction. There is no evidence of intracranial mass lesion or mass effect. No  abnormal extraaxial fluid collections are identified. Chronic small vessel disease is again demonstrated as well as old left thalamic lacunar infarct. No evidence hydrocephalus. No skull abnormality identified. IMPRESSION: No acute intracranial abnormality. Chronic small vessel disease and old left thalamic lacunar infarct. Electronically Signed   By: Earle Gell M.D.   On: 02/16/2015 13:30    EKG:   Orders placed or performed during the hospital encounter of 02/15/15  . EKG 12-Lead  . EKG 12-Lead  . EKG 12-Lead  . EKG  12-Lead   normal sinus rhythm at 64 bpm no ST-T changes.  IMPRESSION AND PLAN:   1 .dizziness, fainting episode likely secondary to restarting coreg and cardizem,decreased dose of coreg.pt also has orthostatic hypotension,. #2 coronary artery disease with the PCI recently: pt stopped taking plavix 10 days ago,but restarted today.. Check the troponins to evaluate for any ischemia. 3. chronic kidney disease a G3: Stable renal function. #4 history of stroke: Continue aspirin.  5.h/o DMII:takes Invokana. 6.depression;psych following he records are reviewed and case discussed with ED provider. Management plans discussed with the patient, family and they are in agreement.  CODE STATUS: full  TOTAL TIME TAKING CARE OF THIS PATIENT: 55 minutes.    Epifanio Lesches M.D on 02/16/2015 at 3:43 PM  Between 7am to 6pm - Pager - [phone]  After 6pm go to [email or web address] - password [email or web address] [facility]  Office  [phone]  CC: Primary care physician; [name], MD  Note: This dictation was prepared with Dragon dictation along with smaller phrase technology. Any transcriptional errors that result from this process are unintentional.

## 2015-02-16 NOTE — Progress Notes (Signed)
Patient to med room for vs and blood sugar monitoring and results recorded. Patient to take scheduled meds, writer picked up from pharmacy. Patient blood glucose 188 and patient refuses insulin per sliding scale as recommended by MD. MD to med room to speak with patient and patient "has a spell and feels dizzy". Patient lowered to floor by MD and RN. VS obtained with BP 128/72 and pulse 70. Skin warm and dry. No distress, no complaint. Patient did not hit head. Patient transferred to wheel chair and states "she will go eat her lunch and then lay down". Safety maintained. Meds held per MD.

## 2015-02-16 NOTE — Progress Notes (Signed)
Patient EKG performed. Patient with no distress, no complaint. Resting in bed.

## 2015-02-16 NOTE — BHH Group Notes (Signed)
[facility] Group Notes:  (Nursing/MHT/Case Management/Adjunct)  Date:  02/16/2015  Time:  10:32 AM  Type of Therapy:  Psychoeducational Skills  Participation Level:  Active  Participation Quality:  Appropriate  Affect:  Appropriate  Cognitive:  Appropriate  Insight:  Good  Engagement in Group:  Engaged  Modes of Intervention:  Education  Summary of Progress/Problems:  [name] 02/16/2015, 10:32 AM

## 2015-02-16 NOTE — Progress Notes (Signed)
[facility] [place] NAME: [name]    MR#:  [id number]  DATE OF BIRTH:  [birth year]  SUBJECTIVE:  CHIEF COMPLAINT:  Chest pain Called by psychiatry to assess patient regarding episode of chest pain. She states associated chest pain retrosternal radiating to the right chest, acute onset, sharp in quality, 3/10 intensity symptoms have now completely resolved.  REVIEW OF SYSTEMS:  CONSTITUTIONAL: No fever, fatigue or weakness.  EYES: No blurred or double vision.  EARS, NOSE, AND THROAT: No tinnitus or ear pain.  RESPIRATORY: No cough, shortness of breath, wheezing or hemoptysis.  CARDIOVASCULAR: Positive chest pain, denies orthopnea, edema.  GASTROINTESTINAL: Positive nausea, denies vomiting, diarrhea or abdominal pain.  GENITOURINARY: No dysuria, hematuria.  ENDOCRINE: No polyuria, nocturia,  HEMATOLOGY: No anemia, easy bruising or bleeding SKIN: No rash or lesion. MUSCULOSKELETAL: No joint pain or arthritis.   NEUROLOGIC: No tingling, numbness, weakness.  PSYCHIATRY: No active anxiety or depression.   DRUG ALLERGIES:   Allergies  Allergen Reactions  . Tramadol Other (See Comments)    Reaction:  Burning of pts mouth and confusion     VITALS:  Blood pressure 165/80, pulse 99, temperature 98 F (36.7 C), temperature source Oral, resp. rate 20, height 5\' 2"  (1.575 m), weight 180 lb (81.647 kg), SpO2 100 %.  PHYSICAL EXAMINATION:  VITAL SIGNS: Filed Vitals:   02/16/15 1211 02/16/15 2130  BP: 113/76 165/80  Pulse: 87 99  Temp:  98 F (36.7 C)  Resp: 20 20   GENERAL:45 y.o.female currently in no acute distress.  HEAD: Normocephalic, atraumatic.  EYES: Pupils equal, round, reactive to light. Extraocular muscles intact. No scleral icterus.  MOUTH: Moist mucosal membrane. Dentition intact. No abscess noted.  EAR, NOSE, THROAT: Clear without exudates. No external lesions.  NECK: Supple. No thyromegaly. No nodules. No  JVD.  PULMONARY: Clear to ascultation, without wheeze rails or rhonci. No use of accessory muscles, Good respiratory effort. good air entry bilaterally CHEST: Nontender to palpation.  CARDIOVASCULAR: S1 and S2. Regular rate and rhythm. No murmurs, rubs, or gallops. No edema. Pedal pulses 2+ bilaterally.  GASTROINTESTINAL: Soft, nontender, nondistended. No masses. Positive bowel sounds. No hepatosplenomegaly.  MUSCULOSKELETAL: No swelling, clubbing, or edema. Range of motion full in all extremities.  NEUROLOGIC: Cranial nerves II through XII are intact. No gross focal neurological deficits. Sensation intact. Reflexes intact.  SKIN: No ulceration, lesions, rashes, or cyanosis. Skin warm and dry. Turgor intact.  PSYCHIATRIC: Mood, affect within normal limits. The patient is awake, alert and oriented x 3. Insight, judgment intact.      LABORATORY PANEL:   CBC  Recent Labs Lab 02/15/15 0907  WBC 8.6  HGB 11.1*  HCT 35.9  PLT 202   ------------------------------------------------------------------------------------------------------------------  Chemistries   Recent Labs Lab 02/15/15 0907 02/16/15 1040  NA 136 138  K 3.7 4.4  CL 104 105  CO2 24 24  GLUCOSE 116* 196*  BUN 13 19  CREATININE 1.61* 1.77*  CALCIUM 9.0 9.4  AST 16  --   ALT 14  --   ALKPHOS 81  --   BILITOT 0.5  --    ------------------------------------------------------------------------------------------------------------------  Cardiac Enzymes  Recent Labs Lab 02/16/15 2139  TROPONINI <0.03   ------------------------------------------------------------------------------------------------------------------  RADIOLOGY:  Dg Chest 2 View  02/16/2015  CLINICAL DATA:  Preop clearance. EXAM: CHEST  2 VIEW COMPARISON:  None. FINDINGS: Normal mediastinum and cardiac silhouette. Normal pulmonary vasculature. No evidence of effusion, infiltrate, or pneumothorax. No  acute bony abnormality. Surgical clips along  the LEFT thyroid bed. IMPRESSION: No acute cardiopulmonary process. Electronically Signed   By: Suzy Bouchard M.D.   On: 02/16/2015 12:55   Ct Head Wo Contrast  02/16/2015  CLINICAL DATA:  Depression. Previous stroke. Preprocedure evaluation for electroconvulsive therapy. EXAM: CT HEAD WITHOUT CONTRAST TECHNIQUE: Contiguous axial images were obtained from the base of the skull through the vertex without intravenous contrast. COMPARISON:  06/03/2014 FINDINGS: There is no evidence of intracranial hemorrhage, brain edema, or other signs of acute infarction. There is no evidence of intracranial mass lesion or mass effect. No abnormal extraaxial fluid collections are identified. Chronic small vessel disease is again demonstrated as well as old left thalamic lacunar infarct. No evidence hydrocephalus. No skull abnormality identified. IMPRESSION: No acute intracranial abnormality. Chronic small vessel disease and old left thalamic lacunar infarct. Electronically Signed   By: Earle Gell M.D.   On: 02/16/2015 13:30    EKG:   Orders placed or performed during the hospital encounter of 02/15/15  . EKG 12-Lead  . EKG 12-Lead  . EKG 12-Lead  . EKG 12-Lead  . ED EKG  . ED EKG  . EKG 12-Lead  . EKG 12-Lead    ASSESSMENT AND PLAN:   45 year old [name] history of coronary artery disease originally admitted on 02/16/15 for major depressive disorder currently being evaluated for an episode of chest pain  1. Chest pain: She is on aspirin, statin, Plavix, beta blocker, Imdur therapy-her cardiac enzymes have been within normal limits 2, continue to follow cardiac enzymes were total of 3, EKG checked bedside read performed by myself no acute ST-T wave abnormalities, patient's symptoms have subsided at this time no further intervention required. I have added as needed nitroglycerin sublingual 2. Type 2 diabetes non-insulin-requiring: Continue oral regiment including invokana,,, metformin 3.  Hypothyroidism unspecified: Synthroid 4. GERD without esophagitis: PPI therapy 5. Major depressive disorder: Care per primary services    All the records are reviewed and case discussed with Care Management/Social Workerr. Management plans discussed with the patient, family and they are in agreement.  CODE STATUS FULL  TOTAL TIME TAKING CARE OF THIS PATIENT: 30 minutes.      Hower,  Karenann Cai.D on 02/16/2015 at 10:43 PM  Between 7am to 6pm - Pager - [phone]  After 6pm: House Pager: - [phone]  [facility]  Office  [phone]  CC: Primary care physician; [name], MD
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
## 2015-02-16 NOTE — Progress Notes (Signed)
Patient with depressed affect, cooperative behavior with meals, meds and plan of care. No SI/HI at this time. Good adls. Actively participating in plan of care, with daily audit sheet completed and group participation as her goal. [name] needs well with staff. Appropriate with peers. Safety maintained.

## 2015-02-16 NOTE — Progress Notes (Signed)
Patient to go with Security and Tech to c-scan and x-ray via wheelchair at this time.

## 2015-02-16 NOTE — BHH Group Notes (Signed)
[facility] LCSW Group Therapy  02/16/2015 3:50 PM  Type of Therapy:  Group Therapy  Participation Level:  Active  Participation Quality:  Attentive  Affect:  Depressed  Cognitive:  Alert  Insight:  Improving  Engagement in Therapy:  Improving  Modes of Intervention:  Discussion, Education, Socialization and Support  Summary of Progress/Problems: Pt will identify unhealthy thoughts and how they impact their emotions and behavior. Pt will be encouraged to discuss these thoughts, emotions and behaviors with the group. Aneth attended group and stayed the entire time. She discussed financial difficulties and how it impacts her thinking. She states her family and friends consider her to be a negative person and she agrees. She reports when she talks, she is mostly negative.   [place] MSW, [place]  02/16/2015, 3:50 PM

## 2015-02-16 NOTE — [facility] Group Notes (Signed)
[facility] Group Notes:  (Nursing/MHT/Case Management/Adjunct)  Date:  02/16/2015  Time:  10:34 PM  Type of Therapy:  Group Therapy  Participation Level:  Active  Participation Quality:  Appropriate  Affect:  Appropriate  Cognitive:  Appropriate  Insight:  Appropriate  Engagement in Group:  Engaged  Modes of Intervention:  Support  Summary of Progress/Problems:  [name] 02/16/2015, 10:34 PM

## 2015-02-16 NOTE — Progress Notes (Signed)
Contacted by nursing as pt c/o nausea and chest pain. BP and HR wnl.   Consulted Hospitalist STAT

## 2015-02-16 NOTE — Plan of Care (Signed)
Problem: Ineffective individual coping Goal: LTG: Patient will report a decrease in negative feelings Outcome: Progressing Actively participating in plan of care.

## 2015-02-17 LAB — BASIC METABOLIC PANEL
ANION GAP: 8 (ref 5–15)
BUN: 21 mg/dL — ABNORMAL HIGH (ref 6–20)
CALCIUM: 8.9 mg/dL (ref 8.9–10.3)
CO2: 24 mmol/L (ref 22–32)
Chloride: 110 mmol/L (ref 101–111)
Creatinine, Ser: 1.33 mg/dL — ABNORMAL HIGH (ref 0.44–1.00)
GFR, EST AFRICAN AMERICAN: 55 mL/min — AB (ref >60)
GFR, EST NON AFRICAN AMERICAN: 47 mL/min — AB (ref >60)
Glucose, Bld: 80 mg/dL (ref 65–99)
Potassium: 4.2 mmol/L (ref 3.5–5.1)
SODIUM: 142 mmol/L (ref 135–145)

## 2015-02-17 LAB — GLUCOSE, CAPILLARY
GLUCOSE-CAPILLARY: 87 mg/dL (ref 65–99)
GLUCOSE-CAPILLARY: 54 mg/dL — AB (ref 65–99)
Glucose-Capillary: 158 mg/dL — ABNORMAL HIGH (ref 65–99)
Glucose-Capillary: 64 mg/dL — ABNORMAL LOW (ref 65–99)
Glucose-Capillary: 96 mg/dL (ref 65–99)

## 2015-02-17 LAB — TROPONIN I: TROPONIN I: 0.03 ng/mL (ref <0.031)

## 2015-02-17 LAB — PROLACTIN: PROLACTIN: 11.6 ng/mL (ref 4.8–23.3)

## 2015-02-17 MED ORDER — BACLOFEN 10 MG PO TABS
5.0000 mg | ORAL_TABLET | Freq: Two times a day (BID) | ORAL | Status: DC
  Administered 2015-02-17: 5 mg via ORAL
  Filled 2015-02-17: qty 1

## 2015-02-17 MED ORDER — CYANOCOBALAMIN 1000 MCG/ML IJ SOLN
1000.0000 ug | Freq: Every day | INTRAMUSCULAR | Status: DC
  Administered 2015-02-17 – 2015-02-20 (×4): 1000 ug via SUBCUTANEOUS
  Filled 2015-02-17 (×4): qty 1

## 2015-02-17 NOTE — [facility] Suicide Risk Assessment (Signed)
[facility] INPATIENT:  Family/Significant Other Suicide Prevention Education  Suicide Prevention Education:  Education Completed;Mike [name] [phone] (husband) has been identified by the patient as the family member/significant other with whom the patient will be residing, and identified as the person(s) who will aid the patient in the event of a mental health crisis (suicidal ideations/suicide attempt).  With written consent from the patient, the family member/significant other has been provided the following suicide prevention education, prior to the and/or following the discharge of the patient.  The suicide prevention education provided includes the following:  Suicide risk factors  Suicide prevention and interventions  National Suicide Hotline telephone number  [facility] assessment telephone number  [facility] Emergency Assistance [place] and/or Residential Mobile Crisis Unit telephone number  Request made of family/significant other to:  Remove weapons (e.g., guns, rifles, knives), all items previously/currently identified as safety concern.    Remove drugs/medications (over-the-counter, prescriptions, illicit drugs), all items previously/currently identified as a safety concern.  The family member/significant other verbalizes understanding of the suicide prevention education information provided.  The family member/significant other agrees to remove the items of safety concern listed above.  [place] MSW, [place]  02/17/2015, 11:15 AM

## 2015-02-17 NOTE — Plan of Care (Signed)
Problem: Ineffective individual coping Goal: STG: Patient will remain free from self harm Outcome: Progressing No SI/HI at this time.

## 2015-02-17 NOTE — Progress Notes (Signed)
D: Pt affect is brighter this evening. Denies SI/HI/AVH at this time. Pt c/o nausea and chest pain this evening, stating "the last time this happened I had a 98% blockage". MD on call notified and hospitalist consult done. Pt does not request PRN Nitroglycerin that was ordered. Pt reports that she has just been informed that her aunt, who was diagnosed with stage 4 lung cancer, has been given 24 hours to live according to hospice. Pt states she would like to be discharged in time for the funeral should it occur but wants to stay here and get the treatment she needs. Pt reports that her parents are not supportive of her being here, but she has support from her husband. Pt is open to doing ECT this week. A: Pt instructed to lay in bed if she begins to feel dizzy and call staff before getting up during the night. Pt does have a wheelchair in her room. Emotional support and encouragement provided. Medications given as prescribed. q15 minute safety checks maintained. R: Pt verbalizes understanding. Pt remains free from harm.

## 2015-02-17 NOTE — Progress Notes (Signed)
Patient blood glucose 64, no s/s of hypo/hyperglycemia. Patient eating dinner at this time. Safety maintained.

## 2015-02-17 NOTE — Progress Notes (Signed)
Patient with appropriate affect, cooperative behavior with meals, meds and plan of care. No SI/HI/AVH at this time. Good adls. Verbalizes needs appropriately with staff. Patient remains orthostatic vs and vs monitored and recorded. Blood glucose monitored and 54 at this time. Patient given crackers and peanut butter and writer to recheck glucose level. No distress, no complaint. Patient using wheelchair at this time. Safety maintained.Marland Kitchen

## 2015-02-17 NOTE — Progress Notes (Signed)
[facility] MD Progress Note  02/17/2015 9:14 AM [name]  MRN:  [id number] Subjective:  Patient reports feeling better today she denies having any physical complaints. She denies suicidal ideation but mood still depressed. Denies major problems with sleep, appetite and concentration are still low.  She denies auditory or visual hallucinations, denies homicidal ideation. Denies side effects from medications.  Patient says that one of her aunts is in hospice, she is afraid that she may passed away at any time.   Social worker spoke with the patient's husband. He reported she has been abusing pain medications, says that she will take pain medications for any little pain. He also reports that she usually gets more depressed when somebody in her family and sat in the hospital apparently apparently there are 2 people now in the hospital and one of them is in hospice.  Per nursing: D: Pt affect is brighter this evening. Denies SI/HI/AVH at this time. Pt c/o nausea and chest pain this evening, stating "the last time this happened I had a 98% blockage". MD on call notified and hospitalist consult done. Pt does not request PRN Nitroglycerin that was ordered. Pt reports that she has just been informed that her aunt, who was diagnosed with stage 4 lung cancer, has been given 24 hours to live according to hospice. Pt states she would like to be discharged in time for the funeral should it occur but wants to stay here and get the treatment she needs. Pt reports that her parents are not supportive of her being here, but she has support from her husband. Pt is open to doing ECT this week. A: Pt instructed to lay in bed if she begins to feel dizzy and call staff before getting up during the night. Pt does have a wheelchair in her room. Emotional support and encouragement provided. Medications given as prescribed. q15 minute safety checks maintained. R: Pt verbalizes understanding. Pt remains free from harm.  Principal  Problem: Severe recurrent major depression without psychotic features ([place]) Diagnosis:   Patient Active Problem List   Diagnosis Date Noted  . Chest pain [R07.9] 02/16/2015  . Severe recurrent major depression without psychotic features ([place]) [F33.2] 02/15/2015  . Helicobacter pylori infection [B96.81] 11/23/2014  . Hemiparesis, left ([place]) [G81.94] 11/23/2014  . Ischemic cardiomyopathy [I25.5]   . Benign neoplasm of colon [D12.6] 11/20/2014  . Coronary artery disease involving native coronary artery of native heart with angina pectoris with documented spasm ([place]) [I25.111]   . Malignant melanoma ([place]) [C43.9] 08/25/2014  . Chronic kidney disease (CKD), stage III (moderate) [N18.3] 08/25/2014  . Chronic systolic CHF (congestive heart failure) ([place]) [I50.22]   . Incomplete bladder emptying [R33.9] 07/12/2014  . Adult hypothyroidism [E03.9] 12/30/2013  . Aberrant subclavian artery [Q27.8] 11/17/2013  . Multiple sclerosis ([place]) [G35] 11/02/2013  . Headache, migraine [G43.909] 05/29/2013  . Hyperlipidemia [E78.5]   . GERD (gastroesophageal reflux disease) [K21.9]   . Neuropathy ([place]) [G62.9] 01/02/2011  . CVA (cerebral vascular accident) ([place]) [I63.9] 06/21/2008  . Diabetes mellitus type 2 with complications, uncontrolled ([place]) [E11.8, E11.65] 05/01/2008  . Essential hypertension [I10] 05/01/2008   Total Time spent with patient: 30 minutes     Sleep: Fair  Appetite:  Fair   Past Psychiatric History: Patient denies prior admissions for mental health issues. She did overdose 2 years ago on her medications but did not tell anybody about it. She was hospitalized due to hypoglycemic hypotension but people were not aware that this was actually  an overdose. The patient also overdosed as a teenager on her mother medications patient didn't require to be seen in the emergency department. Patient has been seeing Dr. Jimmye Norman for about 1 year. She had a therapist in our clinic but due to missing 3 of  her appointments she was dismissed.  Risk to Self: Is patient at risk for suicide?: Yes Risk to Others:   no  Past Medical History: Patient follows up with Dr. Raquel Sarna neurologist from [place] [facility], Dr. Zollie Scale nephrologist, Dr. Caryn Section is her primary care doctor and her cardiologist is [facility] Past Medical History  Diagnosis Date  . Colon polyp   . GERD (gastroesophageal reflux disease)   . CAD (coronary artery disease)     a. cath 08/09/2003: mild LAD plaque, no obs dzs noted; b. 12/18/10 stress test w/o ischemia; c. cath 11/2014: LM: nl, mid 85-90% s/p PCI/DES, mid LCX 30%, RCA nl  . CVA (cerebral vascular accident) ([place])     a. 06/2008-TEE nl LV fxn; b. 01/2013 - notes indicate short run of SVT at that time  . Multiple sclerosis ([place]) 2015  . Syncope   . Chronic systolic CHF (congestive heart failure) ([place])     a. echo 01/29/2013 EF 40-45%, basal anteroseptal & mid anteroseptal segments abnl; b echo 05/2012 EF 50-55%, mildly dilated LA, nl RVSP  . Orthostatic hypotension   . Aortic arch aneurysm ([place])   . Ischemic cardiomyopathy   . MS (mitral stenosis)   . Malignant melanoma of skin of scalp ([place])   . Hypertension   . Depression   . Diabetes mellitus without complication ([place])   . Headache     Migrains  . Hyperlipidemia   . CKD (chronic kidney disease), stage IV ([place])   . Hypothyroidism   . Anxiety   . Gastric ulcer 04/27/2011  . Heart attack [facility])     Past Surgical History  Procedure Laterality Date  . Appendectomy    . Right oophorectomy    . Tubal ligation      R tube removed still w/ Left  . Dilation and curettage of uterus    . Gastric bypass  09/2009    [facility]   . Esophagogastroduodenoscopy (egd) with propofol N/A 09/14/2014    Procedure: ESOPHAGOGASTRODUODENOSCOPY (EGD) WITH PROPOFOL; Surgeon: Josefine Class, MD;  Location: [facility] ENDOSCOPY; Service: Endoscopy; Laterality: N/A;  . Cardiac catheterization N/A 11/09/2014    Procedure: Coronary Angiography; Surgeon: Minna Merritts, MD; Location: [place] CV LAB; Service: Cardiovascular; Laterality: N/A;  . Cardiac catheterization N/A 11/12/2014    Procedure: Coronary Stent Intervention; Surgeon: Isaias Cowman, MD; Location: [place] CV LAB; Service: Cardiovascular; Laterality: N/A;  . Trigger finger release    . Melanoma excision  2016    Dr. Evorn Gong   Family History:  Family History  Problem Relation Age of Onset  . Hypertension Mother   . Anxiety disorder Mother   . Depression Mother   . Bipolar disorder Mother   . Heart disease Mother   . Hyperlipidemia Mother   . Kidney disease Father   . Heart disease Father   . Hypertension Father   . Diabetes Father   . Stroke Father   . Colon cancer Father     dx in his 43's  . Anxiety disorder Father   . Depression Father   . Skin cancer Father   . Kidney disease Sister   . Thyroid nodules Sister   . Hypertension Sister   . Hypertension  Sister   . Diabetes Sister   . Hyperlipidemia Sister   . Depression Sister    Family Psychiatric History: Patient reports her mother had a diagnosis of bipolar disorder and used to drinking heavily. There is no history of suicides in her family  Social History: Patient has been with her husband for about 5 years but they have only been married for 1 year. This is the patient's third marriage patient stated that HER-2 previous marriages ended as her husbands were unfaithful. She has a son who is now 90 years old from her second marriage. Patient lives in a house with her son and her husband. She is currently on long-term disability through her employer. 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 She  is currently trying to apply for disability. She has a 12th grade education. She denies any legal problems. History  Alcohol Use No    History  Drug Use No    Social History   Social History  . Marital Status: Married    Spouse Name: N/A  . Number of Children: 1  . Years of Education: N/A   Occupational History  . 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 Disabled through 08/02/2013 per forms 02/03/2013   Social History Main Topics  . Smoking status: Former Smoker    Types: Cigarettes    Quit date: 08/31/1994  . Smokeless tobacco: Never Used     Comment: quit 28 years ago  . Alcohol Use: No  . Drug Use: No  . Sexual Activity: Not Currently    Birth Control/ Protection: None   Other Topics Concern  . None   Social History Narrative   previously did custolial work. 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 Disabled through 08/02/2013 per forms 02/03/13        Current Medications: Current Facility-Administered Medications  Medication Dose Route Frequency Provider Last Rate Last Dose  . acetaminophen (TYLENOL) tablet 650 mg  650 mg Oral Q6H PRN Gonzella Lex, MD   650 mg at 02/16/15 9528  . alum & mag hydroxide-simeth (MAALOX/MYLANTA) 200-200-20 MG/5ML suspension 30 mL  30 mL Oral Q4H PRN Gonzella Lex, MD      . ARIPiprazole (ABILIFY) tablet 5 mg  5 mg Oral Daily Hildred Priest, MD   5 mg at 02/17/15 0902  . aspirin EC tablet 81 mg  81 mg Oral Daily Gonzella Lex, MD   81 mg at 02/17/15 4132  . baclofen (LIORESAL) tablet 5 mg  5 mg Oral BID Hildred Priest, MD      . canagliflozin [facility]) tablet 300 mg  300 mg Oral QAC breakfast Lenis Noon, RPH   300 mg at 02/17/15 0804  . carvedilol (COREG) tablet 3.125 mg  3.125 mg Oral BID Epifanio Lesches, MD   3.125 mg at 02/17/15 0902   . citalopram (CELEXA) tablet 40 mg  40 mg Oral Daily Gonzella Lex, MD   40 mg at 02/17/15 0904  . clopidogrel (PLAVIX) tablet 75 mg  75 mg Oral Daily Hildred Priest, MD   75 mg at 02/17/15 0903  . diltiazem (CARDIZEM CD) 24 hr capsule 120 mg  120 mg Oral Daily Epifanio Lesches, MD   120 mg at 02/17/15 0903  . insulin aspart (novoLOG) injection 0-9 Units  0-9 Units Subcutaneous TID WC Hildred Priest, MD  0 Units at 02/16/15 1205  . isosorbide mononitrate (IMDUR) 24 hr tablet 30 mg  30 mg Oral Daily Gonzella Lex, MD   30 mg at 02/17/15 0902  . levothyroxine (SYNTHROID, LEVOTHROID) tablet 25 mcg  25 mcg Oral QAC breakfast Gonzella Lex, MD   25 mcg at 02/17/15 0803  . magnesium hydroxide (MILK OF MAGNESIA) suspension 30 mL  30 mL Oral Daily PRN Gonzella Lex, MD      . nitroGLYCERIN (NITROSTAT) SL tablet 0.4 mg  0.4 mg Sublingual Q5 min PRN Lytle Butte, MD      . pantoprazole (PROTONIX) EC tablet 40 mg  40 mg Oral BID Gonzella Lex, MD   40 mg at 02/17/15 0903  . pregabalin (LYRICA) capsule 100 mg  100 mg Oral BID Gonzella Lex, MD   100 mg at 02/17/15 0904  . rosuvastatin (CRESTOR) tablet 20 mg  20 mg Oral q1800 Hildred Priest, MD   20 mg at 02/16/15 1658  . traZODone (DESYREL) tablet 100 mg  100 mg Oral QHS Hildred Priest, MD   100 mg at 02/16/15 2242    Lab Results:  Results for orders placed or performed during the hospital encounter of 02/15/15 (from the past 48 hour(s))  Glucose, capillary     Status: Abnormal   Collection Time: 02/15/15  8:46 PM  Result Value Ref Range   Glucose-Capillary 106 (H) 65 - 99 mg/dL   Comment 1 Notify RN    Comment 2 Document in Chart   Glucose, capillary     Status: None   Collection Time: 02/16/15  7:04 AM  Result Value Ref Range   Glucose-Capillary 94 65 - 99 mg/dL  Vitamin B12     Status: None   Collection Time: 02/16/15 10:40 AM  Result Value Ref Range   Vitamin B-12 275 180 - 914 pg/mL     Comment: (NOTE) This assay is not validated for testing neonatal or myeloproliferative syndrome specimens for Vitamin B12 levels. Performed at [facility] metabolic panel     Status: Abnormal   Collection Time: 02/16/15 10:40 AM  Result Value Ref Range   Sodium 138 135 - 145 mmol/L   Potassium 4.4 3.5 - 5.1 mmol/L   Chloride 105 101 - 111 mmol/L   CO2 24 22 - 32 mmol/L   Glucose, Bld 196 (H) 65 - 99 mg/dL   BUN 19 6 - 20 mg/dL   Creatinine, Ser 1.77 (H) 0.44 - 1.00 mg/dL   Calcium 9.4 8.9 - 10.3 mg/dL   GFR calc non Af Amer 33 (L) >60 mL/min   GFR calc Af Amer 39 (L) >60 mL/min    Comment: (NOTE) The eGFR has been calculated using the CKD EPI equation. This calculation has not been validated in all clinical situations. eGFR's persistently <60 mL/min signify possible Chronic Kidney Disease.    Anion gap 9 5 - 15  Troponin I (q 6hr x 3)     Status: None   Collection Time: 02/16/15 10:40 AM  Result Value Ref Range   Troponin I <0.03 <0.031 ng/mL    Comment:        NO INDICATION OF MYOCARDIAL INJURY.   Glucose, capillary     Status: Abnormal   Collection Time: 02/16/15 11:58 AM  Result Value Ref Range   Glucose-Capillary 188 (H) 65 - 99 mg/dL  Glucose, capillary     Status: None   Collection Time: 02/16/15  5:02  PM  Result Value Ref Range   Glucose-Capillary 74 65 - 99 mg/dL  Glucose, capillary     Status: Abnormal   Collection Time: 02/16/15  8:12 PM  Result Value Ref Range   Glucose-Capillary 135 (H) 65 - 99 mg/dL  Troponin I (q 6hr x 3)     Status: None   Collection Time: 02/16/15  9:39 PM  Result Value Ref Range   Troponin I <0.03 <0.031 ng/mL    Comment:        NO INDICATION OF MYOCARDIAL INJURY.   Troponin I (q 6hr x 3)     Status: None   Collection Time: 02/17/15  4:00 AM  Result Value Ref Range   Troponin I 0.03 <0.031 ng/mL    Comment:        NO INDICATION OF MYOCARDIAL INJURY.   Basic metabolic panel     Status: Abnormal   Collection  Time: 02/17/15  4:00 AM  Result Value Ref Range   Sodium 142 135 - 145 mmol/L   Potassium 4.2 3.5 - 5.1 mmol/L   Chloride 110 101 - 111 mmol/L   CO2 24 22 - 32 mmol/L   Glucose, Bld 80 65 - 99 mg/dL   BUN 21 (H) 6 - 20 mg/dL   Creatinine, Ser 1.33 (H) 0.44 - 1.00 mg/dL   Calcium 8.9 8.9 - 10.3 mg/dL   GFR calc non Af Amer 47 (L) >60 mL/min   GFR calc Af Amer 55 (L) >60 mL/min    Comment: (NOTE) The eGFR has been calculated using the CKD EPI equation. This calculation has not been validated in all clinical situations. eGFR's persistently <60 mL/min signify possible Chronic Kidney Disease.    Anion gap 8 5 - 15  Glucose, capillary     Status: None   Collection Time: 02/17/15  6:41 AM  Result Value Ref Range   Glucose-Capillary 87 65 - 99 mg/dL   Comment 1 Notify RN     Physical Findings: AIMS: Facial and Oral Movements Muscles of Facial Expression: None, normal Lips and Perioral Area: None, normal Jaw: None, normal Tongue: None, normal,Extremity Movements Upper (arms, wrists, hands, fingers): None, normal Lower (legs, knees, ankles, toes): None, normal, Trunk Movements Neck, shoulders, hips: None, normal, Overall Severity Severity of abnormal movements (highest score from questions above): None, normal Incapacitation due to abnormal movements: None, normal Patient's awareness of abnormal movements (rate only patient's report): No Awareness, Dental Status Current problems with teeth and/or dentures?: No Does patient usually wear dentures?: No  CIWA:    COWS:     Musculoskeletal: Strength & Muscle Tone: within normal limits Gait & Station: normal Patient leans: N/A  Psychiatric Specialty Exam: Review of Systems  Constitutional: Negative.   HENT: Negative.   Eyes: Negative.   Respiratory: Negative.   Cardiovascular: Negative.   Gastrointestinal: Negative.   Genitourinary: Negative.   Musculoskeletal: Negative.   Skin: Negative.   Neurological: Negative.    Endo/Heme/Allergies: Negative.   Psychiatric/Behavioral: Positive for depression.    Blood pressure 160/80, pulse 67, temperature 98 F (36.7 C), temperature source Oral, resp. rate 20, height '5\' 2"'  (1.575 m), weight 81.647 kg (180 lb), SpO2 100 %.Body mass index is 32.91 kg/(m^2).  General Appearance: Well Groomed  Engineer, water::  Good  Speech:  Clear and Coherent  Volume:  Normal  Mood:  Dysphoric  Affect:  Congruent  Thought Process:  Linear  Orientation:  Full (Time, Place, and Person)  Thought Content:  Hallucinations: None  Suicidal Thoughts:  No  Homicidal Thoughts:  No  Memory:  Immediate;   Fair Recent;   Fair Remote;   Good  Judgement:  Fair  Insight:  Fair  Psychomotor Activity:  Decreased  Concentration:  Fair  Recall:  [place] of Knowledge:Good  Language: Good  Akathisia:  No  Handed:    AIMS (if indicated):     Assets:  Communication Skills Desire for Improvement Financial Resources/Insurance Housing Intimacy Social Support  ADL's:  Intact  Cognition: WNL  Sleep:  Number of Hours: 6.5   Treatment Plan Summary: Daily contact with patient to assess and evaluate symptoms and progress in treatment and Medication management   45 years old [name] with a long history of depression. Admitted from our emergency department due to suicidal ideation. The patient has a multitude of medical problems that worsening her prognosis. The patient has not been compliant with some of the medications prescribed by her cardiologist such a score rate, Plavix and Imdur. Patient reports no improvement in her symptomatology with current medication management to her outpatient psychiatrist. Patient reports serious suicidal attempt 2 years ago.  Major depressive disorder: Continue citalopram 40 mg daily and Abilify, which has been increased from 2 mg to 5 mg. Primary psychiatrist in the outpatient clinic brought up the possibility of possible ECT as patient has failed  treatment with antidepressants. High suspicion the patient has not been compliant. She also was discharged from the therapist in our clinic due to missing 3 appointments.  In preparation for possible ECT I will order checks x-ray, EKG and head CT.--- Concerns with patient's poor physical health. She has been seen by the hospitalist twice since admission.  Insomnia: Continue trazodone 100 mg by mouth daily at bedtime. Slept 6 hours last night  Diabetes type 2: Continue  Invokana. Capillary blood glucose will be checked 4 times a day. I ordered sliding scale insulin. Metformin will be d/c due to elevated Cr.  Hypertension/ischemic cardiomyopathy/congestive heart failure: Patient has been is started on Correctol 25 mg by mouth 4 times a day, Cardizem 120 mg daily in 2 or 30 mg daily. Patient is on aspirin 81 mg a day and Plavix 75 mg a day.----- chest pain overnight. Troponins were not elevated. Seen by the hospitalist in the morning and then in the evening again yesterday. Earlier in the day on 1/14 patient had a syncopal episode.  History of 2 cerebrovascular accidents: Continue Plavix and aspirin. Patient suffers from left side hemiparesis. The weakness appears mild, patient does not have any difficulties with gait  Chronic renal insufficiency stage III: Creatinine on January 15 was 1.33.  Dyslipidemia: Continue Crestor 20 mg daily  Hypothyroidism: Continue Synthroid 25 g daily  Neuropathic pain: Continue Lyrica 100 mg by mouth twice a day  Multiple sclerosis: Not on any treatment at this time  Migraines and back pain: Continue baclofen but will decrease to 5 mg twice a day when necessary   GERD: Continue Protonix 40 mg twice a day  B12 def: will replace B12 with inj for 7 days.  Diet: Carb control and low sodium  Precautions every 15 minute checks  Vital signs 3 times a day  Labs: Hemoglobin A1c is pending. B12 was less than 300  Imaging:  Head CT: Chronic small vessel  disease and  old left thalamic lacunar infarct.   Chest x-ray: wnl  Discharge disposition: Home once a stable  Discharge follow-up: Will continue to follow-up with Dr. Jimmye Norman.  Hildred Priest 02/17/2015, 9:14 AM

## 2015-02-17 NOTE — BHH Group Notes (Signed)
[place] LCSW Group Therapy  02/17/2015 5:41 PM  Type of Therapy:  Group Therapy  Participation Level:  Active  Participation Quality:  Appropriate  Affect:  Appropriate  Cognitive:  Alert  Insight:  Improving  Engagement in Therapy:  Improving  Modes of Intervention:  Discussion, Education, Socialization and Support  Summary of Progress/Problems: Todays topic: Grudges  Patients will be encouraged to discuss their thoughts, feelings, and behaviors as to why one holds on to grudges and reasons why people have grudges. Patients will process the impact of grudges on their daily lives and identify thoughts and feelings related to holding grudges. Patients will identify feelings and thoughts related to what life would look like without grudges.   [place] MSW, LCSWA  02/17/2015, 5:41 PM
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
## 2015-02-17 NOTE — Plan of Care (Signed)
Problem: Diagnosis: Increased Risk For Suicide Attempt Goal: LTG-Patient Will Report Improved Mood and Deny Suicidal LTG (by discharge) Patient will report improved mood and deny suicidal ideation.  Outcome: Progressing Pt reports that she feels better today and denies SI Goal: STG-Patient Will Attend All Groups On The Unit Outcome: Progressing Pt attending groups on the unit

## 2015-02-17 NOTE — Progress Notes (Addendum)
[facility] - [place] at [facility]   PATIENT NAME: [name]    MR#:  [id number]  DATE OF BIRTH:  1970-06-23  SUBJECTIVE:   Ambulating in the psych unit. No cp today. No sob. Stressed out with aunt's illness. REVIEW OF SYSTEMS:   Review of Systems  Constitutional: Negative for fever, chills and weight loss.  HENT: Negative for ear discharge, ear pain and nosebleeds.   Eyes: Negative for blurred vision, pain and discharge.  Respiratory: Negative for sputum production, shortness of breath, wheezing and stridor.   Cardiovascular: Negative for chest pain, palpitations, orthopnea and PND.  Gastrointestinal: Negative for nausea, vomiting, abdominal pain and diarrhea.  Genitourinary: Negative for urgency and frequency.  Musculoskeletal: Negative for back pain and joint pain.  Neurological: Negative for sensory change, speech change, focal weakness and weakness.  Psychiatric/Behavioral: Negative for depression and hallucinations. The patient is not nervous/anxious.   All other systems reviewed and are negative.  Tolerating Diet:yes Tolerating PT: not needed  DRUG ALLERGIES:   Allergies  Allergen Reactions  . Tramadol Other (See Comments)    Reaction:  Burning of pts mouth and confusion     VITALS:  Blood pressure 160/80, pulse 67, temperature 98 F (36.7 C), temperature source Oral, resp. rate 20, height 5\' 2"  (1.575 m), weight 81.647 kg (180 lb), SpO2 100 %.  PHYSICAL EXAMINATION:   Physical Exam  GENERAL:  45 y.o.-year-old patient lying in the bed with no acute distress.  EYES: Pupils equal, round, reactive to light and accommodation. No scleral icterus. Extraocular muscles intact.  HEENT: Head atraumatic, normocephalic. Oropharynx and nasopharynx clear.  NECK:  Supple, no jugular venous distention. No thyroid enlargement, no tenderness.  LUNGS: Normal breath sounds bilaterally, no wheezing, rales, rhonchi. No use of accessory muscles of  respiration.  CARDIOVASCULAR: S1, S2 normal. No murmurs, rubs, or gallops.  ABDOMEN: Soft, nontender, nondistended. Bowel sounds present. No organomegaly or mass.  EXTREMITIES: No cyanosis, clubbing or edema b/l.    NEUROLOGIC: Cranial nerves II through XII are intact. No focal Motor or sensory deficits b/l.   PSYCHIATRIC: The patient is alert and oriented x 3.  SKIN: No obvious rash, lesion, or ulcer.   LABORATORY PANEL:  CBC  Recent Labs Lab 02/15/15 0907  WBC 8.6  HGB 11.1*  HCT 35.9  PLT 202    Chemistries   Recent Labs Lab 02/15/15 0907  02/17/15 0400  NA 136  < > 142  K 3.7  < > 4.2  CL 104  < > 110  CO2 24  < > 24  GLUCOSE 116*  < > 80  BUN 13  < > 21*  CREATININE 1.61*  < > 1.33*  CALCIUM 9.0  < > 8.9  AST 16  --   --   ALT 14  --   --   ALKPHOS 81  --   --   BILITOT 0.5  --   --   < > = values in this interval not displayed.  Cardiac Enzymes  Recent Labs Lab 02/17/15 0400  TROPONINI 0.03   RADIOLOGY:  Dg Chest 2 View  02/16/2015  CLINICAL DATA:  Preop clearance. EXAM: CHEST  2 VIEW COMPARISON:  None. FINDINGS: Normal mediastinum and cardiac silhouette. Normal pulmonary vasculature. No evidence of effusion, infiltrate, or pneumothorax. No acute bony abnormality. Surgical clips along the LEFT thyroid bed. IMPRESSION: No acute cardiopulmonary process. Electronically Signed   By: Genevive Bi M.D.   On: 02/16/2015 12:55  Ct Head Wo Contrast  02/16/2015  CLINICAL DATA:  Depression. Previous stroke. Preprocedure evaluation for electroconvulsive therapy. EXAM: CT HEAD WITHOUT CONTRAST TECHNIQUE: Contiguous axial images were obtained from the base of the skull through the vertex without intravenous contrast. COMPARISON:  06/03/2014 FINDINGS: There is no evidence of intracranial hemorrhage, brain edema, or other signs of acute infarction. There is no evidence of intracranial mass lesion or mass effect. No abnormal extraaxial fluid collections are identified.  Chronic small vessel disease is again demonstrated as well as old left thalamic lacunar infarct. No evidence hydrocephalus. No skull abnormality identified. IMPRESSION: No acute intracranial abnormality. Chronic small vessel disease and old left thalamic lacunar infarct. Electronically Signed   By: Myles Rosenthal M.D.   On: 02/16/2015 13:30   ASSESSMENT AND PLAN:  45 year old [name] history of coronary artery disease originally admitted on 02/16/15 for major depressive disorder currently being evaluated for an episode of chest pain  1. Chest pain: She is on aspirin, statin, Plavix, beta blocker, Imdur therapy -her cardiac enzymes have been within normal limits 2, EKG no acute ST-T wave abnormalities, patient's symptoms have subsided at this time no further intervention required -cont as needed nitroglycerin sublingual -pt will need cardiology clearance if inpt ECT therapy is considered for her depression management.  2. Type 2 diabetes non-insulin-requiring: Continue oral regiment including invokana, metformin   3. Hypothyroidism unspecified: Synthroid  4. GERD without esophagitis: PPI therapy  5. Major depressive disorder: Care per primary services   Will sign off. Call if needed.  Management plans discussed with the patient and in agreement.  CODE STATUS: full  DVT Prophylaxis: ambulaiton  TOTAL TIME TAKING CARE OF THIS PATIENT: 30 minutes.  >50% time spent on counselling and coordination of care   Note: This dictation was prepared with Dragon dictation along with smaller phrase technology. Any transcriptional errors that result from this process are unintentional.  Haruko Mersch M.D on 02/17/2015 at 1:14 PM  Between 7am to 6pm - Pager - [phone]  After 6pm go to [email or web address] - password [email or web address] [facility]  [facility] [facility] [facility]  Office  [phone]  CC: Primary care physician; Mila Merry, MD

## 2015-02-18 DIAGNOSIS — Z9114 Patient's other noncompliance with medication regimen: Secondary | ICD-10-CM

## 2015-02-18 DIAGNOSIS — E538 Deficiency of other specified B group vitamins: Secondary | ICD-10-CM

## 2015-02-18 DIAGNOSIS — F1910 Other psychoactive substance abuse, uncomplicated: Secondary | ICD-10-CM

## 2015-02-18 DIAGNOSIS — Z91148 Patient's other noncompliance with medication regimen for other reason: Secondary | ICD-10-CM

## 2015-02-18 DIAGNOSIS — F1990 Other psychoactive substance use, unspecified, uncomplicated: Secondary | ICD-10-CM

## 2015-02-18 LAB — URINALYSIS COMPLETE WITH MICROSCOPIC (ARMC ONLY)
BACTERIA UA: NONE SEEN
Bilirubin Urine: NEGATIVE
HGB URINE DIPSTICK: NEGATIVE
Ketones, ur: NEGATIVE mg/dL
NITRITE: NEGATIVE
PH: 6 (ref 5.0–8.0)
Protein, ur: 30 mg/dL — AB
SPECIFIC GRAVITY, URINE: 1.007 (ref 1.005–1.030)

## 2015-02-18 LAB — GLUCOSE, CAPILLARY
GLUCOSE-CAPILLARY: 73 mg/dL (ref 65–99)
Glucose-Capillary: 89 mg/dL (ref 65–99)
Glucose-Capillary: 230 mg/dL — ABNORMAL HIGH (ref 65–99)

## 2015-02-18 MED ORDER — BACLOFEN 10 MG PO TABS
5.0000 mg | ORAL_TABLET | Freq: Two times a day (BID) | ORAL | Status: DC | PRN
  Filled 2015-02-18: qty 1

## 2015-02-18 MED ORDER — BACLOFEN 10 MG PO TABS
10.0000 mg | ORAL_TABLET | Freq: Two times a day (BID) | ORAL | Status: DC | PRN
  Administered 2015-02-18 – 2015-02-20 (×4): 10 mg via ORAL
  Filled 2015-02-18 (×3): qty 1

## 2015-02-18 NOTE — Progress Notes (Signed)
Recreation Therapy Notes  INPATIENT RECREATION THERAPY ASSESSMENT  Patient Details Name: [name] MRN: [id number] DOB: 1971-01-22 Today's Date: 02/18/2015  Patient Stressors: Family, Death, Other (Comment) ("so-so" relationship with parents - mother had bipolar and does not take meds, she is judgmental, dad is not supportive of her hospitalization; cousin's husband died recently and her aunt is in hospice; "everything")  Coping Skills:   Isolate, Arguments, Avoidance, Art/Dance, Music, Sports  Personal Challenges: Anger, Communication, Concentration, Decision-Making, Expressing Yourself, Problem-Solving, Relationships, Self-Esteem/Confidence, Social Interaction, Stress Management, Trusting Others  Leisure Interests (2+):  Individual - TV, Individual - Other (Comment) (Play on her cell phone)  Awareness of Community Resources:  No  Community Resources:     Current Use:    If no, Barriers?:    Patient Strengths:  Nothing  Patient Identified Areas of Improvement:  All of them  Current Recreation Participation:  Nothing but playing on her phone  Patient Goal for Hospitalization:  To work on self-esteem, trust, and anger  [place] of Residence:  [place] of Residence:  [place]   Current [place] (including self-harm):  No  Current HI:  No  Consent to Intern Participation: N/A   Leonette Monarch, LRT/CTRS 02/18/2015, 2:50 PM

## 2015-02-18 NOTE — BHH Group Notes (Signed)
[place] LCSW Group Therapy  02/18/2015 4:39 PM  Type of Therapy:  Group Therapy  Participation Level:  Minimal  Participation Quality:  Appropriate  Affect:  Appropriate  Cognitive:  Alert and Appropriate  Insight:  Improving  Engagement in Therapy:  Engaged  Modes of Intervention:  Discussion, Education, Problem-solving, Rapport Building and Socialization  Summary of Progress/Problems:Pt attended group and participated in discussion of goal setting and discharge planning. She verbalizes that her goal is to be less afraid and that she plans to do that by being around other patients and being social in groups.  August Saucer, MSW, LCSW 02/18/2015, 4:39 PM

## 2015-02-18 NOTE — Progress Notes (Signed)
Recreation Therapy Notes  Date: 01.16.17 Time: 3:00 pm Location: Craft Room  Group Topic: Wellness  Goal Area(s) Addresses:  Patient will identify at least one item per dimension of health. Patient will examine areas they are deficient in.  Behavioral Response: Attentive, Interactive  Intervention: 6 Dimensions of Health  Activity: Patients were given a worksheet with the definitions of the 6 dimensions of health. Patients were given a worksheet with the 6 dimensions of health listed and were encouraged to write 2-3 things they are currently doing in each category.  Education: LRT educated patients on how this activity is related to their admission and d/c.  Education Outcome: Acknowledges education/In group clarification offered  Clinical Observations/Feedback: Patient completed activity by writing at least 2 items in each category. Patient contributed to group discussion by stating what areas she was not giving enough attention to.  Leonette Monarch, LRT/CTRS 02/18/2015 4:26 PM

## 2015-02-18 NOTE — Progress Notes (Signed)
Patient is not having ECT procedure done today.  She is not NPO as of now.  There was not any order for NPO.  Confirmed no ECT per Dr. Weber Cooks and Helene Kelp, RN.

## 2015-02-18 NOTE — Progress Notes (Signed)
D: Patient is no longer NPO.  Breakfast tray was ordered for her and medications were given.  She has a few somatic compaints; ongoing diarrhea and UTI.  Patient rates her depression, hopelessness and anxiety as a 6.  She is pleasant upon approach and mood is depressed.  She states she had some suicidal ideation last night, however, she reports none today.  She denies HI/AVH. A: Continue to monitor medication management and MD orders.  Safety checks completed every 15 minutes per protocol. R: Patient is receptive to staff; her behavior is appropriate.

## 2015-02-18 NOTE — Progress Notes (Signed)
Patient denies SI/HI/AV during shift. Patient states that she is hopeful of her treatment plan and is NPO after midnight for AM procedure. Patient vitals signs stable during shift. Patient medication compliant. Will continue to monitor.
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
## 2015-02-18 NOTE — Progress Notes (Signed)
[facility] MD Progress Note  02/18/2015 9:15 AM [place]  MRN:  [id number] Subjective:  Patient reports feeling somewhat better.  She continues to have thoughts about suicide on and off. Mood is still depressed. Energy and concentration are low. Her appetite and his sleep has been fair. She denies auditory or visual hallucinations, denies homicidal ideation. Denies side effects from medications.  Today she complains of having low back pain and wants to have an increased dose of baclofen, sister 5 mg is too little of a dose.  Social worker spoke with the patient's husband. He reported she has been abusing pain medications, says that she will take pain medications for any little pain. He also reports that she usually gets more depressed when somebody in her family and sat in the hospital apparently apparently there are 2 people now in the hospital and one of them is in hospice.  Per nursing: Patient with appropriate affect, cooperative behavior with meals, meds and plan of care. No SI/HI/AVH at this time. Good adls. Verbalizes needs appropriately with staff. Patient remains orthostatic vs and vs monitored and recorded. Blood glucose monitored and 54 at this time. Patient given crackers and peanut butter and writer to recheck glucose level. No distress, no complaint. Patient using wheelchair at this time. Safety maintained..   Principal Problem: Severe recurrent major depression without psychotic features ([place]) Diagnosis:   Patient Active Problem List   Diagnosis Date Noted  . Vitamin B12 deficiency [E53.8] 02/18/2015  . Opioid use disorder, moderate, dependence ([place]) [F11.20] 02/17/2015  . Chest pain [R07.9] 02/16/2015  . Severe recurrent major depression without psychotic features ([place]) [F33.2] 02/15/2015  . Helicobacter pylori infection [B96.81] 11/23/2014  . Hemiparesis, left ([place]) [G81.94] 11/23/2014  . Ischemic cardiomyopathy [I25.5]   . Benign neoplasm of colon [D12.6] 11/20/2014  .  Coronary artery disease involving native coronary artery of native heart with angina pectoris with documented spasm ([place]) [I25.111]   . Malignant melanoma ([place]) [C43.9] 08/25/2014  . Chronic kidney disease (CKD), stage III (moderate) [N18.3] 08/25/2014  . Chronic systolic CHF (congestive heart failure) ([place]) [I50.22]   . Incomplete bladder emptying [R33.9] 07/12/2014  . Adult hypothyroidism [E03.9] 12/30/2013  . Aberrant subclavian artery [Q27.8] 11/17/2013  . Multiple sclerosis ([place]) [G35] 11/02/2013  . Headache, migraine [G43.909] 05/29/2013  . Hyperlipidemia [E78.5]   . GERD (gastroesophageal reflux disease) [K21.9]   . Neuropathy ([place]) [G62.9] 01/02/2011  . CVA (cerebral vascular accident) ([place]) [I63.9] 06/21/2008  . Diabetes mellitus type 2 with complications, uncontrolled ([place]) [E11.8, E11.65] 05/01/2008  . Essential hypertension [I10] 05/01/2008   Total Time spent with patient: 30 minutes     Sleep: Fair  Appetite:  Fair   Past Psychiatric History: Patient denies prior admissions for mental health issues. She did overdose 2 years ago on her medications but did not tell anybody about it. She was hospitalized due to hypoglycemic hypotension but people were not aware that this was actually an overdose. The patient also overdosed as a teenager on her mother medications patient didn't require to be seen in the emergency department. Patient has been seeing Dr. Jimmye Norman for about 1 year. She had a therapist in our clinic but due to missing 3 of her appointments she was dismissed.  Risk to Self: Is patient at risk for suicide?: Yes Risk to Others:   no  Past Medical History: Patient follows up with Dr. Raquel Sarna neurologist from [facility], Dr. Zollie Scale nephrologist, Dr. Caryn Section is her primary care doctor and her  cardiologist is [place] Past Medical History  Diagnosis Date  . Colon polyp   . GERD (gastroesophageal reflux disease)   . CAD (coronary artery disease)      a. cath 08/09/2003: mild LAD plaque, no obs dzs noted; b. 12/18/10 stress test w/o ischemia; c. cath 11/2014: LM: nl, mid 85-90% s/p PCI/DES, mid LCX 30%, RCA nl  . CVA (cerebral vascular accident) ([place])     a. 06/2008-TEE nl LV fxn; b. 01/2013 - notes indicate short run of SVT at that time  . Multiple sclerosis ([place]) 2015  . Syncope   . Chronic systolic CHF (congestive heart failure) ([place])     a. echo 01/29/2013 EF 40-45%, basal anteroseptal & mid anteroseptal segments abnl; b echo 05/2012 EF 50-55%, mildly dilated LA, nl RVSP  . Orthostatic hypotension   . Aortic arch aneurysm ([place])   . Ischemic cardiomyopathy   . MS (mitral stenosis)   . Malignant melanoma of skin of scalp ([place])   . Hypertension   . Depression   . Diabetes mellitus without complication ([place])   . Headache     Migrains  . Hyperlipidemia   . CKD (chronic kidney disease), stage IV ([place])   . Hypothyroidism   . Anxiety   . Gastric ulcer 04/27/2011  . Heart attack [facility])     Past Surgical History  Procedure Laterality Date  . Appendectomy    . Right oophorectomy    . Tubal ligation      R tube removed still w/ Left  . Dilation and curettage of uterus    . Gastric bypass  09/2009    [facility]   . Esophagogastroduodenoscopy (egd) with propofol N/A 09/14/2014    Procedure: ESOPHAGOGASTRODUODENOSCOPY (EGD) WITH PROPOFOL; Surgeon: Josefine Class, MD; Location: [facility] ENDOSCOPY; Service: Endoscopy; Laterality: N/A;  . Cardiac catheterization N/A 11/09/2014    Procedure: Coronary Angiography; Surgeon: Minna Merritts, MD; Location: [place] CV LAB; Service: Cardiovascular; Laterality: N/A;  . Cardiac catheterization N/A 11/12/2014    Procedure: Coronary Stent Intervention; Surgeon: Isaias Cowman, MD; Location: [place] CV LAB; Service: Cardiovascular; Laterality:  N/A;  . Trigger finger release    . Melanoma excision  2016    Dr. Evorn Gong   Family History:  Family History  Problem Relation Age of Onset  . Hypertension Mother   . Anxiety disorder Mother   . Depression Mother   . Bipolar disorder Mother   . Heart disease Mother   . Hyperlipidemia Mother   . Kidney disease Father   . Heart disease Father   . Hypertension Father   . Diabetes Father   . Stroke Father   . Colon cancer Father     dx in his 70's  . Anxiety disorder Father   . Depression Father   . Skin cancer Father   . Kidney disease Sister   . Thyroid nodules Sister   . Hypertension Sister   . Hypertension Sister   . Diabetes Sister   . Hyperlipidemia Sister   . Depression Sister    Family Psychiatric History: Patient reports her mother had a diagnosis of bipolar disorder and used to drinking heavily. There is no history of suicides in her family  Social History: Patient has been with her husband for about 5 years but they have only been married for 1 year. This is the patient's third marriage patient stated that HER-2 previous marriages ended as her husbands were unfaithful. She has a son who is now 52  years old from her second marriage. Patient lives in a house with her son and her husband. She is currently on long-term disability through her employer. 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 She is currently trying to apply for disability. She has a 12th grade education. She denies any legal problems. History  Alcohol Use No    History  Drug Use No    Social History   Social History  . Marital Status: Married    Spouse Name: N/A  . Number of Children: 1  . Years of Education: N/A   Occupational History  . Disabled     Previously did custodial work. Disabled as of 05/25/2012 due to CVA causing LUE and  LLE weakness. Disabled through 08/02/2013 per forms 02/03/2013   Social History Main Topics  . Smoking status: Former Smoker    Types: Cigarettes    Quit date: 08/31/1994  . Smokeless tobacco: Never Used     Comment: quit 28 years ago  . Alcohol Use: No  . Drug Use: No  . Sexual Activity: Not Currently    Birth Control/ Protection: None   Other Topics Concern  . None   Social History Narrative   previously did custolial work. 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 Disabled through 08/02/2013 per forms 02/03/13        Current Medications: Current Facility-Administered Medications  Medication Dose Route Frequency Provider Last Rate Last Dose  . acetaminophen (TYLENOL) tablet 650 mg  650 mg Oral Q6H PRN Gonzella Lex, MD   650 mg at 02/16/15 4332  . alum & mag hydroxide-simeth (MAALOX/MYLANTA) 200-200-20 MG/5ML suspension 30 mL  30 mL Oral Q4H PRN Gonzella Lex, MD      . ARIPiprazole (ABILIFY) tablet 5 mg  5 mg Oral Daily Hildred Priest, MD   Stopped at 02/18/15 [phone]  . aspirin EC tablet 81 mg  81 mg Oral Daily Gonzella Lex, MD   81 mg at 02/17/15 8416  . baclofen (LIORESAL) tablet 5 mg  5 mg Oral BID PRN Hildred Priest, MD      . canagliflozin [facility]) tablet 300 mg  300 mg Oral QAC breakfast Lenis Noon, [facility]   Stopped at 02/18/15 0750  . carvedilol (COREG) tablet 3.125 mg  3.125 mg Oral BID Epifanio Lesches, MD   3.125 mg at 02/18/15 0738  . citalopram (CELEXA) tablet 40 mg  40 mg Oral Daily Gonzella Lex, MD   Stopped at 02/18/15 0750  . clopidogrel (PLAVIX) tablet 75 mg  75 mg Oral Daily Hildred Priest, MD   Stopped at 02/18/15 0750  . cyanocobalamin ((VITAMIN B-12)) injection 1,000 mcg  1,000 mcg Subcutaneous Daily Hildred Priest, MD   Stopped at 02/18/15 [phone]  . diltiazem (CARDIZEM CD) 24 hr capsule 120 mg  120 mg Oral Daily Epifanio Lesches, MD   120  mg at 02/18/15 0737  . insulin aspart (novoLOG) injection 0-9 Units  0-9 Units Subcutaneous TID WC Hildred Priest, MD   0 Units at 02/16/15 1205  . isosorbide mononitrate (IMDUR) 24 hr tablet 30 mg  30 mg Oral Daily Gonzella Lex, MD   30 mg at 02/18/15 0737  . levothyroxine (SYNTHROID, LEVOTHROID) tablet 25 mcg  25 mcg Oral QAC breakfast Gonzella Lex, MD   Stopped at 02/18/15 [phone]  . magnesium hydroxide (MILK OF MAGNESIA) suspension 30 mL  30 mL Oral Daily PRN Gonzella Lex, MD      .  nitroGLYCERIN (NITROSTAT) SL tablet 0.4 mg  0.4 mg Sublingual Q5 min PRN Lytle Butte, MD      . pantoprazole (PROTONIX) EC tablet 40 mg  40 mg Oral BID Gonzella Lex, MD   Stopped at 02/18/15 [phone]  . pregabalin (LYRICA) capsule 100 mg  100 mg Oral BID Gonzella Lex, MD   Stopped at 02/18/15 [phone]  . rosuvastatin (CRESTOR) tablet 20 mg  20 mg Oral q1800 Hildred Priest, MD   20 mg at 02/17/15 1657  . traZODone (DESYREL) tablet 100 mg  100 mg Oral QHS Hildred Priest, MD   100 mg at 02/17/15 2059    Lab Results:  Results for orders placed or performed during the hospital encounter of 02/15/15 (from the past 48 hour(s))  Vitamin B12     Status: None   Collection Time: 02/16/15 10:40 AM  Result Value Ref Range   Vitamin B-12 275 180 - 914 pg/mL    Comment: (NOTE) This assay is not validated for testing neonatal or myeloproliferative syndrome specimens for Vitamin B12 levels. Performed at [facility] metabolic panel     Status: Abnormal   Collection Time: 02/16/15 10:40 AM  Result Value Ref Range   Sodium 138 135 - 145 mmol/L   Potassium 4.4 3.5 - 5.1 mmol/L   Chloride 105 101 - 111 mmol/L   CO2 24 22 - 32 mmol/L   Glucose, Bld 196 (H) 65 - 99 mg/dL   BUN 19 6 - 20 mg/dL   Creatinine, Ser 1.77 (H) 0.44 - 1.00 mg/dL   Calcium 9.4 8.9 - 10.3 mg/dL   GFR calc non Af Amer 33 (L) >60 mL/min   GFR calc Af Amer 39 (L) >60 mL/min    Comment: (NOTE) The eGFR  has been calculated using the CKD EPI equation. This calculation has not been validated in all clinical situations. eGFR's persistently <60 mL/min signify possible Chronic Kidney Disease.    Anion gap 9 5 - 15  Troponin I (q 6hr x 3)     Status: None   Collection Time: 02/16/15 10:40 AM  Result Value Ref Range   Troponin I <0.03 <0.031 ng/mL    Comment:        NO INDICATION OF MYOCARDIAL INJURY.   Glucose, capillary     Status: Abnormal   Collection Time: 02/16/15 11:58 AM  Result Value Ref Range   Glucose-Capillary 188 (H) 65 - 99 mg/dL  Glucose, capillary     Status: None   Collection Time: 02/16/15  5:02 PM  Result Value Ref Range   Glucose-Capillary 74 65 - 99 mg/dL  Glucose, capillary     Status: Abnormal   Collection Time: 02/16/15  8:12 PM  Result Value Ref Range   Glucose-Capillary 135 (H) 65 - 99 mg/dL  Troponin I (q 6hr x 3)     Status: None   Collection Time: 02/16/15  9:39 PM  Result Value Ref Range   Troponin I <0.03 <0.031 ng/mL    Comment:        NO INDICATION OF MYOCARDIAL INJURY.   Troponin I (q 6hr x 3)     Status: None   Collection Time: 02/17/15  4:00 AM  Result Value Ref Range   Troponin I 0.03 <0.031 ng/mL    Comment:        NO INDICATION OF MYOCARDIAL INJURY.   Basic metabolic panel     Status: Abnormal   Collection Time: 02/17/15  4:00 AM  Result Value Ref Range   Sodium 142 135 - 145 mmol/L   Potassium 4.2 3.5 - 5.1 mmol/L   Chloride 110 101 - 111 mmol/L   CO2 24 22 - 32 mmol/L   Glucose, Bld 80 65 - 99 mg/dL   BUN 21 (H) 6 - 20 mg/dL   Creatinine, Ser 1.33 (H) 0.44 - 1.00 mg/dL   Calcium 8.9 8.9 - 10.3 mg/dL   GFR calc non Af Amer 47 (L) >60 mL/min   GFR calc Af Amer 55 (L) >60 mL/min    Comment: (NOTE) The eGFR has been calculated using the CKD EPI equation. This calculation has not been validated in all clinical situations. eGFR's persistently <60 mL/min signify possible Chronic Kidney Disease.    Anion gap 8 5 - 15  Glucose,  capillary     Status: None   Collection Time: 02/17/15  6:41 AM  Result Value Ref Range   Glucose-Capillary 87 65 - 99 mg/dL   Comment 1 Notify RN   Glucose, capillary     Status: Abnormal   Collection Time: 02/17/15 11:39 AM  Result Value Ref Range   Glucose-Capillary 54 (L) 65 - 99 mg/dL  Glucose, capillary     Status: Abnormal   Collection Time: 02/17/15 12:44 PM  Result Value Ref Range   Glucose-Capillary 158 (H) 65 - 99 mg/dL  Glucose, capillary     Status: Abnormal   Collection Time: 02/17/15  5:04 PM  Result Value Ref Range   Glucose-Capillary 64 (L) 65 - 99 mg/dL  Glucose, capillary     Status: None   Collection Time: 02/17/15  8:50 PM  Result Value Ref Range   Glucose-Capillary 96 65 - 99 mg/dL   Comment 1 Notify RN   Glucose, capillary     Status: None   Collection Time: 02/18/15  7:31 AM  Result Value Ref Range   Glucose-Capillary 89 65 - 99 mg/dL    Physical Findings: AIMS: Facial and Oral Movements Muscles of Facial Expression: None, normal Lips and Perioral Area: None, normal Jaw: None, normal Tongue: None, normal,Extremity Movements Upper (arms, wrists, hands, fingers): None, normal Lower (legs, knees, ankles, toes): None, normal, Trunk Movements Neck, shoulders, hips: None, normal, Overall Severity Severity of abnormal movements (highest score from questions above): None, normal Incapacitation due to abnormal movements: None, normal Patient's awareness of abnormal movements (rate only patient's report): No Awareness, Dental Status Current problems with teeth and/or dentures?: No Does patient usually wear dentures?: No  CIWA:    COWS:     Musculoskeletal: Strength & Muscle Tone: within normal limits Gait & Station: normal Patient leans: N/A  Psychiatric Specialty Exam: Review of Systems  Constitutional: Negative.   HENT: Negative.   Eyes: Negative.   Respiratory: Negative.   Cardiovascular: Negative.   Gastrointestinal: Negative.    Genitourinary: Negative.   Musculoskeletal: Negative.   Skin: Negative.   Neurological: Negative.   Endo/Heme/Allergies: Negative.   Psychiatric/Behavioral: Positive for depression and substance abuse. Negative for suicidal ideas, hallucinations and memory loss. The patient is not nervous/anxious and does not have insomnia.     Blood pressure 147/85, pulse 65, temperature 97.9 F (36.6 C), temperature source Oral, resp. rate 20, height '5\' 2"'  (1.575 m), weight 81.647 kg (180 lb), SpO2 100 %.Body mass index is 32.91 kg/(m^2).  General Appearance: Well Groomed  Engineer, water::  Good  Speech:  Clear and Coherent  Volume:  Normal  Mood:  Dysphoric  Affect:  Congruent  Thought Process:  Linear  Orientation:  Full (Time, Place, and Person)  Thought Content:  Hallucinations: None  Suicidal Thoughts:  No  Homicidal Thoughts:  No  Memory:  Immediate;   Fair Recent;   Fair Remote;   Good  Judgement:  Fair  Insight:  Fair  Psychomotor Activity:  Decreased  Concentration:  Fair  Recall:  [employer] Knowledge:Good  Language: Good  Akathisia:  No  Handed:    AIMS (if indicated):     Assets:  Communication Skills Desire for Improvement Financial Resources/Insurance Housing Intimacy Social Support  ADL's:  Intact  Cognition: WNL  Sleep:  Number of Hours: 5.75   Treatment Plan Summary: Daily contact with patient to assess and evaluate symptoms and progress in treatment and Medication management   45 years old [name] with a long history of depression. Admitted from our emergency department due to suicidal ideation. The patient has a multitude of medical problems that worsening her prognosis. The patient has not been compliant with some of the medications prescribed by her cardiologist such a score rate, Plavix and Imdur. Patient reports no improvement in her symptomatology with current medication management to her outpatient psychiatrist. Patient reports serious suicidal  attempt 2 years ago.  Major depressive disorder: Continue citalopram 40 mg daily and Abilify, which has been increased from 2 mg to 5 mg. Primary psychiatrist in the outpatient clinic brought up the possibility of possible ECT as patient has failed treatment with antidepressants. High suspicion the patient has not been compliant. She also was discharged from the therapist in our clinic due to missing 3 appointments.  Initially would consider ECT however this patient has several medical conditions that increase the risk for complications. Also were highly suspect she was noncompliant with medications and family has reported she has been abusing medications. ECT will not be pursued further.  Also found to have vitamin B12 low levels which is likely contributing to depression.  Insomnia: Continue trazodone 100 mg by mouth daily at bedtime. Slept 5 hours last night  Diabetes type 2: Continue  Invokana. Capillary blood glucose will be checked 4 times a day. I ordered sliding scale insulin. Metformin will be d/c due to elevated Cr.  Labs were reviewed with pharmacies who is states is okay to continue in the current with current creatinine and GFR. Hemoglobin A1c was 5.8 in December.  Hypertension/ischemic cardiomyopathy/congestive heart failure: Patient has been is started on Correctol 25 mg by mouth 4 times a day, Cardizem 120 mg daily in 2 or 30 mg daily. Patient is on aspirin 81 mg a day and Plavix 75 mg a day.----- chest pain on 1/14. Troponins were not elevated. Seen by the hospitalist in the morning and then in the evening again on 1/14. Earlier in the day on 1/14 patient had a syncopal episode.  History of 2 cerebrovascular accidents: Continue Plavix and aspirin. Patient suffers from left side hemiparesis. The weakness appears mild, patient does not have any difficulties with gait  Chronic renal insufficiency stage III: Creatinine on January 15 was 1.33. GFR 45  Dyslipidemia: Continue Crestor  20 mg daily  Hypothyroidism: Continue Synthroid 25 g daily  Neuropathic pain: Continue Lyrica 100 mg by mouth twice a day  Multiple sclerosis: Not on any treatment at this time  Migraines and back pain: Continue baclofen but will decrease to 5 mg twice a day when necessary   GERD: Continue Protonix 40 mg twice a day  B12 def: will replace B12  with inj for 7 days. (6 more days)  Diet: Carb control and low sodium  Precautions every 15 minute checks  Vital signs 3 times a day  Labs: Hemoglobin A1c is pending. B12 was less than 300  Imaging:  Head CT: Chronic small vessel disease and  old left thalamic lacunar infarct.   Chest x-ray: wnl  Discharge disposition: Home once a stable  Discharge follow-up: Will continue to follow-up with Dr. Jimmye Norman.  We'll try to set of outpatient psychotherapy   Hildred Priest 02/18/2015, 9:15 AM

## 2015-02-18 NOTE — BHH Group Notes (Signed)
[place] Group Notes:  (Nursing/MHT/Case Management/Adjunct)  Date:  02/18/2015  Time:  12:03 PM  Type of Therapy:  Group Therapy  Participation Level:  Active  Participation Quality:  Appropriate and Attentive  Affect:  Appropriate  Cognitive:  Appropriate  Insight:  Good  Engagement in Group:  Engaged  Modes of Intervention:  Support  Summary of Progress/Problems:  [name] 02/18/2015, 12:03 PM

## 2015-02-18 NOTE — BHH Group Notes (Signed)
[place] LCSW Group Therapy  02/18/2015 4:44 PM  Type of Therapy:  Group Therapy  Participation Level:  Minimal  Participation Quality:  Appropriate  Affect:  Appropriate  Cognitive:  Appropriate  Insight:  Improving  Engagement in Therapy:  Engaged  Modes of Intervention:  Discussion, Education, Problem-solving and Socialization  Summary of Progress/Problems: Pt participated in discussion around resiliency and was able to offer several good suggestions on what made her feel like she could "bounce back"  She was engaged asking questions when she did not understand something. Dossie Arbour PMSW, LCSW 02/18/2015, 4:44 PM

## 2015-02-19 LAB — GLUCOSE, CAPILLARY
GLUCOSE-CAPILLARY: 82 mg/dL (ref 65–99)
Glucose-Capillary: 110 mg/dL — ABNORMAL HIGH (ref 65–99)

## 2015-02-19 MED ORDER — BACLOFEN 10 MG PO TABS: 10.0000 mg | ORAL_TABLET | Freq: Two times a day (BID) | ORAL | Status: DC | PRN

## 2015-02-19 MED ORDER — ARIPIPRAZOLE 5 MG PO TABS: 5.0000 mg | ORAL_TABLET | Freq: Every day | ORAL | Status: DC

## 2015-02-19 MED ORDER — CYANOCOBALAMIN 1000 MCG/ML IJ SOLN: 1000.0000 ug | INTRAMUSCULAR | Status: DC

## 2015-02-19 MED ORDER — SULFAMETHOXAZOLE-TRIMETHOPRIM 800-160 MG PO TABS
1.0000 | ORAL_TABLET | Freq: Two times a day (BID) | ORAL | Status: DC
  Administered 2015-02-19 – 2015-02-20 (×2): 1 via ORAL
  Filled 2015-02-19 (×3): qty 1

## 2015-02-19 MED ORDER — ASPIRIN EC 81 MG PO TBEC: 81.0000 mg | DELAYED_RELEASE_TABLET | Freq: Every day | ORAL | Status: DC

## 2015-02-19 MED ORDER — SULFAMETHOXAZOLE-TRIMETHOPRIM 800-160 MG PO TABS: 1.0000 | ORAL_TABLET | Freq: Two times a day (BID) | ORAL | Status: DC

## 2015-02-19 NOTE — BHH Group Notes (Signed)
[place] Group Notes:  (Nursing/MHT/Case Management/Adjunct)  Date:  02/19/2015  Time:  1:00 AM  Type of Therapy:  Group Therapy  Participation Level:  Active  Participation Quality:  Appropriate  Affect:  Appropriate  Cognitive:  Appropriate  Insight:  Good and Improving  Engagement in Group:  Developing/Improving and Engaged  Modes of Intervention:  n/a  Summary of Progress/Problems:  [name] 02/19/2015, 1:00 AM

## 2015-02-19 NOTE — Progress Notes (Signed)
Recreation Therapy Notes  Date: 01.17.17 Time: 3:00 pm Location: Craft Room  Group Topic: Self-expression  Goal Area(s) Addresses:  Patient will be able to identify a color that represents each emotion. Patient will verbalize benefit of using art as a means of self-expression. Patient will verbalize one positive emotion experienced while participating in the activity.  Behavioral Response: Attentive, Interactive   Intervention: The Colors Within Me  Activity: Patients were given a blank face worksheet and instructed to analyze the emotions they were experiencing, pick a color for each emotion, and show on the face how much of that emotion they were experiencing.  Education: LRT educated patients on different forms of self-expression.  Education Outcome: Acknowledges education/In group clarification offered   Clinical Observations/Feedback: Patient completed activity by picking a color for each emotion and showing how much of that emotion she was experiencing on the worksheet. Patient contributed to group discussion by stating what emotions she was experiencing, how she sees her emotions changing once she can d/c, that it was helpful to see her emotions in color and why, what steps she is taking to change her emotions, and what emotions she experienced during group.  Leonette Monarch, LRT/CTRS 02/19/2015 4:21 PM

## 2015-02-19 NOTE — BHH Group Notes (Signed)
[facility] Group Notes:  (Nursing/MHT/Case Management/Adjunct)  Date:  02/19/2015  Time:  9:45 PM  Type of Therapy:  Group Therapy  Participation Level:  Active  Participation Quality:  Appropriate  Affect:  Appropriate  Cognitive:  Appropriate  Insight:  Appropriate  Engagement in Group:  Engaged  Modes of Intervention:  Education  Summary of Progress/Problems:  [name] 02/19/2015, 9:45 PM

## 2015-02-19 NOTE — Plan of Care (Signed)
Problem: [facility] Participation in Recreation Therapeutic Interventions Goal: STG-Patient will demonstrate improved self esteem by identif STG: Self-Esteem - Within 4 treatment sessions, patient will verbalize at least 5 positive affirmation statements in each of 2 treatment sessions to increase self-esteem post d/c.  Outcome: Progressing Treatment Session 1; Completed 1 out of 2: At approximately 9:45 am, LRT met with patient in craft room. Patient verbalized 5 positive affirmation statements. Patient reported it felt "good". LRT encouraged patient to continue saying positive affirmation statements.  Intervention Used: I Am statements  Leonette Monarch, LRT/CTRS 01.17.17 1:21 pm Goal: STG-Patient will identify at least five coping skills for ** STG: Coping Skills - Within 4 treatment sessions, patient will verbalize at least 5 coping skills for anger in each of 2 treatment sessions to increase anger management skills post d/c.  Outcome: Progressing Treatment Session 1; Completed 1 out of 2: At approximately 9:45 am, LRT met with patient in craft room. Patient verbalized 5 coping skills for anger. Patient verbalized what triggers her to get angry, how her body responds to anger, and how she is going to remember to use her healthy coping skills. LRT provided suggestions as well. Intervention Use: Coping Skills worksheet  Leonette Monarch, LRT/CTRS 01.17.17 1:23 pm Goal: STG-Other Recreation Therapy Goal (Specify) STG: Stress Management - Within 4 treatment sessions, patient will verbalize understanding of the stress management techniques in each of 2 treatment sessions to increase stress management skills post d/c.  Outcome: Progressing Treatment Session 1; Completed 1 out of 2: At approximately 9:45 am, LRT met with patient in craft room. LRT educated and provided patient with handouts on stress management techniques. Patient verbalized understanding. LRT encouraged patient to read over and  practice the stress management techniques. Intervention Used: Stress Management handouts  Leonette Monarch, LRT/CTRS 01.17.17 1:25 pm

## 2015-02-19 NOTE — [facility] Group Notes (Signed)
[facility] Group Notes:  (Nursing/MHT/Case Management/Adjunct)  Date:  02/19/2015  Time:  1:56 PM  Type of Therapy:  Psychoeducational Skills  Participation Level:  Active  Participation Quality:  Appropriate  Affect:  Appropriate  Cognitive:  Appropriate  Insight:  Appropriate  Engagement in Group:  Engaged  Modes of Intervention:  Discussion and Education  Summary of Progress/Problems:  Drake Leach 02/19/2015, 1:56 PM

## 2015-02-19 NOTE — Progress Notes (Signed)
[facility] MD Progress Note  02/19/2015 8:51 AM [place]  MRN:  [id number] Subjective:  Patient reports feeling much better today, which she attributes this improvement to receive and vitamin B12 injections. She feels energetic, her mood is good, and she is being able to concentrate. She has been eating well, her sleep is been good. Denies side effects from any of her medications.  Denies suicidality, homicidality or having auditory or visual hallucinations. Patient says she has not felt this good in a very long time.  As far as physical complaints patient reports dysuria.  Social worker spoke with the patient's husband. He reported she has been abusing pain medications, says that she will take pain medications for any little pain. He also reports that she usually gets more depressed when somebody in her family and sat in the hospital apparently apparently there are 2 people now in the hospital and one of them is in hospice.  Per nursing: D: Pt is appropriate and pleasant this evening. States "I feel excellent. I've had energy today and I still do." Denies SI/HI/AVH at this time. Denies pain. Pt CBG taken after snacks tonight and was 360. Pt states she drank some orange juice and had cereal. Pt reports that she feels fine at this time. A: CBG taken twice to verify results. Emotional support and encouragement provided. Medications administered as prescribed. q15 minute safety checks maintained. R: Pt remains free from harm.  Principal Problem: Severe recurrent major depression without psychotic features ([place]) Diagnosis:   Patient Active Problem List   Diagnosis Date Noted  . Vitamin B12 deficiency [E53.8] 02/18/2015  . Non compliance w medication regimen [Z91.14] 02/18/2015  . Misuse of medications for pain [F19.10] 02/18/2015  . Chest pain [R07.9] 02/16/2015  . Severe recurrent major depression without psychotic features ([place]) [F33.2] 02/15/2015  . Helicobacter pylori infection [B96.81]  11/23/2014  . Hemiparesis, left ([place]) [G81.94] 11/23/2014  . Ischemic cardiomyopathy [I25.5]   . Benign neoplasm of colon [D12.6] 11/20/2014  . Coronary artery disease involving native coronary artery of native heart with angina pectoris with documented spasm ([place]) [I25.111]   . Malignant melanoma ([place]) [C43.9] 08/25/2014  . Chronic kidney disease (CKD), stage III (moderate) [N18.3] 08/25/2014  . Chronic systolic CHF (congestive heart failure) ([place]) [I50.22]   . Incomplete bladder emptying [R33.9] 07/12/2014  . Adult hypothyroidism [E03.9] 12/30/2013  . Aberrant subclavian artery [Q27.8] 11/17/2013  . Multiple sclerosis ([place]) [G35] 11/02/2013  . Headache, migraine [G43.909] 05/29/2013  . Hyperlipidemia [E78.5]   . GERD (gastroesophageal reflux disease) [K21.9]   . Neuropathy ([place]) [G62.9] 01/02/2011  . CVA (cerebral vascular accident) ([place]) [I63.9] 06/21/2008  . Diabetes mellitus type 2 with complications, uncontrolled ([place]) [E11.8, E11.65] 05/01/2008  . Essential hypertension [I10] 05/01/2008   Total Time spent with patient: 30 minutes     Sleep: Fair  Appetite:  Fair   Past Psychiatric History: Patient denies prior admissions for mental health issues. She did overdose 2 years ago on her medications but did not tell anybody about it. She was hospitalized due to hypoglycemic hypotension but people were not aware that this was actually an overdose. The patient also overdosed as a teenager on her mother medications patient didn't require to be seen in the emergency department. Patient has been seeing Dr. Jimmye Norman for about 1 year. She had a therapist in our clinic but due to missing 3 of her appointments she was dismissed.  Risk to Self: Is patient at risk for suicide?: Yes Risk to  Others:   no  Past Medical History: Patient follows up with Dr. Raquel Sarna neurologist from [facility], Dr. Zollie Scale nephrologist, Dr. Caryn Section is her primary care doctor and her cardiologist is [place] Past  Medical History  Diagnosis Date  . Colon polyp   . GERD (gastroesophageal reflux disease)   . CAD (coronary artery disease)     a. cath 08/09/2003: mild LAD plaque, no obs dzs noted; b. 12/18/10 stress test w/o ischemia; c. cath 11/2014: LM: nl, mid 85-90% s/p PCI/DES, mid LCX 30%, RCA nl  . CVA (cerebral vascular accident) ([place])     a. 06/2008-TEE nl LV fxn; b. 01/2013 - notes indicate short run of SVT at that time  . Multiple sclerosis ([place]) 2015  . Syncope   . Chronic systolic CHF (congestive heart failure) ([place])     a. echo 01/29/2013 EF 40-45%, basal anteroseptal & mid anteroseptal segments abnl; b echo 05/2012 EF 50-55%, mildly dilated LA, nl RVSP  . Orthostatic hypotension   . Aortic arch aneurysm ([place])   . Ischemic cardiomyopathy   . MS (mitral stenosis)   . Malignant melanoma of skin of scalp ([place])   . Hypertension   . Depression   . Diabetes mellitus without complication ([place])   . Headache     Migrains  . Hyperlipidemia   . CKD (chronic kidney disease), stage IV ([place])   . Hypothyroidism   . Anxiety   . Gastric ulcer 04/27/2011  . Heart attack [facility])     Past Surgical History  Procedure Laterality Date  . Appendectomy    . Right oophorectomy    . Tubal ligation      R tube removed still w/ Left  . Dilation and curettage of uterus    . Gastric bypass  09/2009    [facility]   . Esophagogastroduodenoscopy (egd) with propofol N/A 09/14/2014    Procedure: ESOPHAGOGASTRODUODENOSCOPY (EGD) WITH PROPOFOL; Surgeon: Josefine Class, MD; Location: [facility] ENDOSCOPY; Service: Endoscopy; Laterality: N/A;  . Cardiac catheterization N/A 11/09/2014    Procedure: Coronary Angiography; Surgeon: Minna Merritts, MD; Location: [facility] LAB; Service: Cardiovascular; Laterality: N/A;  . Cardiac catheterization N/A 11/12/2014     Procedure: Coronary Stent Intervention; Surgeon: Isaias Cowman, MD; Location: [facility]; Service: Cardiovascular; Laterality: N/A;  . Trigger finger release    . Melanoma excision  2016    Dr. Evorn Gong   Family History:  Family History  Problem Relation Age of Onset  . Hypertension Mother   . Anxiety disorder Mother   . Depression Mother   . Bipolar disorder Mother   . Heart disease Mother   . Hyperlipidemia Mother   . Kidney disease Father   . Heart disease Father   . Hypertension Father   . Diabetes Father   . Stroke Father   . Colon cancer Father     dx in his 37's  . Anxiety disorder Father   . Depression Father   . Skin cancer Father   . Kidney disease Sister   . Thyroid nodules Sister   . Hypertension Sister   . Hypertension Sister   . Diabetes Sister   . Hyperlipidemia Sister   . Depression Sister    Family Psychiatric History: Patient reports her mother had a diagnosis of bipolar disorder and used to drinking heavily. There is no history of suicides in her family  Social History: Patient has been with her husband for about 5 years but they have only been  married for 1 year. This is the patient's third marriage patient stated that HER-2 previous marriages ended as her husbands were unfaithful. She has a son who is now 1 years old from her second marriage. Patient lives in a house with her son and her husband. She is currently on long-term disability through her employer. 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 She is currently trying to apply for disability. She has a 12th grade education. She denies any legal problems. History  Alcohol Use No    History  Drug Use No    Social History   Social History  . Marital Status: Married    Spouse Name: N/A  . Number of Children: 1  . Years of  Education: N/A   Occupational History  . 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 Disabled through 08/02/2013 per forms 02/03/2013   Social History Main Topics  . Smoking status: Former Smoker    Types: Cigarettes    Quit date: 08/31/1994  . Smokeless tobacco: Never Used     Comment: quit 28 years ago  . Alcohol Use: No  . Drug Use: No  . Sexual Activity: Not Currently    Birth Control/ Protection: None   Other Topics Concern  . None   Social History Narrative   previously did custolial work. 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 Disabled through 08/02/2013 per forms 02/03/13        Current Medications: Current Facility-Administered Medications  Medication Dose Route Frequency Provider Last Rate Last Dose  . acetaminophen (TYLENOL) tablet 650 mg  650 mg Oral Q6H PRN Gonzella Lex, MD   650 mg at 02/16/15 0383  . alum & mag hydroxide-simeth (MAALOX/MYLANTA) 200-200-20 MG/5ML suspension 30 mL  30 mL Oral Q4H PRN Gonzella Lex, MD      . ARIPiprazole (ABILIFY) tablet 5 mg  5 mg Oral Daily Hildred Priest, MD   5 mg at 02/18/15 1110  . aspirin EC tablet 81 mg  81 mg Oral Daily Gonzella Lex, MD   81 mg at 02/18/15 1110  . baclofen (LIORESAL) tablet 10 mg  10 mg Oral BID PRN Hildred Priest, MD   10 mg at 02/19/15 0743  . canagliflozin [facility]) tablet 300 mg  300 mg Oral QAC breakfast Lenis Noon, RPH   300 mg at 02/19/15 3383  . carvedilol (COREG) tablet 3.125 mg  3.125 mg Oral BID Epifanio Lesches, MD   3.125 mg at 02/18/15 2148  . citalopram (CELEXA) tablet 40 mg  40 mg Oral Daily Gonzella Lex, MD   40 mg at 02/18/15 1109  . clopidogrel (PLAVIX) tablet 75 mg  75 mg Oral Daily Hildred Priest, MD   75 mg at 02/18/15 1109  . cyanocobalamin ((VITAMIN B-12)) injection 1,000 mcg  1,000 mcg  Subcutaneous Daily Hildred Priest, MD   1,000 mcg at 02/18/15 1120  . diltiazem (CARDIZEM CD) 24 hr capsule 120 mg  120 mg Oral Daily Epifanio Lesches, MD   120 mg at 02/18/15 0737  . isosorbide mononitrate (IMDUR) 24 hr tablet 30 mg  30 mg Oral Daily Gonzella Lex, MD   30 mg at 02/18/15 0737  . levothyroxine (SYNTHROID, LEVOTHROID) tablet 25 mcg  25 mcg Oral QAC breakfast Gonzella Lex, MD   25 mcg at 02/19/15 0742  . magnesium hydroxide (MILK  OF MAGNESIA) suspension 30 mL  30 mL Oral Daily PRN Gonzella Lex, MD      . nitroGLYCERIN (NITROSTAT) SL tablet 0.4 mg  0.4 mg Sublingual Q5 min PRN Lytle [place], MD      . pantoprazole (PROTONIX) EC tablet 40 mg  40 mg Oral BID Gonzella Lex, MD   40 mg at 02/18/15 2148  . pregabalin (LYRICA) capsule 100 mg  100 mg Oral BID Gonzella Lex, MD   100 mg at 02/18/15 2148  . rosuvastatin (CRESTOR) tablet 20 mg  20 mg Oral q1800 Hildred Priest, MD   20 mg at 02/18/15 1745  . traZODone (DESYREL) tablet 100 mg  100 mg Oral QHS Hildred Priest, MD   100 mg at 02/18/15 2148    Lab Results:  Results for orders placed or performed during the hospital encounter of 02/15/15 (from the past 48 hour(s))  Glucose, capillary     Status: Abnormal   Collection Time: 02/17/15 11:39 AM  Result Value Ref Range   Glucose-Capillary 54 (L) 65 - 99 mg/dL  Glucose, capillary     Status: Abnormal   Collection Time: 02/17/15 12:44 PM  Result Value Ref Range   Glucose-Capillary 158 (H) 65 - 99 mg/dL  Glucose, capillary     Status: Abnormal   Collection Time: 02/17/15  5:04 PM  Result Value Ref Range   Glucose-Capillary 64 (L) 65 - 99 mg/dL  Glucose, capillary     Status: None   Collection Time: 02/17/15  8:50 PM  Result Value Ref Range   Glucose-Capillary 96 65 - 99 mg/dL   Comment 1 Notify RN   Glucose, capillary     Status: None   Collection Time: 02/18/15  7:31 AM  Result Value Ref Range   Glucose-Capillary 89 65 - 99 mg/dL   Glucose, capillary     Status: Abnormal   Collection Time: 02/18/15 11:58 AM  Result Value Ref Range   Glucose-Capillary 230 (H) 65 - 99 mg/dL  Urinalysis complete, with microscopic ([facility] only)     Status: Abnormal   Collection Time: 02/18/15  1:09 PM  Result Value Ref Range   Color, Urine STRAW (A) YELLOW   APPearance CLEAR (A) CLEAR   Glucose, UA >500 (A) NEGATIVE mg/dL   Bilirubin Urine NEGATIVE NEGATIVE   Ketones, ur NEGATIVE NEGATIVE mg/dL   Specific Gravity, Urine 1.007 1.005 - 1.030   Hgb urine dipstick NEGATIVE NEGATIVE   pH 6.0 5.0 - 8.0   Protein, ur 30 (A) NEGATIVE mg/dL   Nitrite NEGATIVE NEGATIVE   Leukocytes, UA 2+ (A) NEGATIVE   RBC / HPF 0-5 0 - 5 RBC/hpf   WBC, UA TOO NUMEROUS TO COUNT 0 - 5 WBC/hpf   Bacteria, UA NONE SEEN NONE SEEN   Squamous Epithelial / LPF 0-5 (A) NONE SEEN   WBC Clumps PRESENT   Glucose, capillary     Status: None   Collection Time: 02/18/15  5:04 PM  Result Value Ref Range   Glucose-Capillary 73 65 - 99 mg/dL   Comment 1 Notify RN     Physical Findings: AIMS: Facial and Oral Movements Muscles of Facial Expression: None, normal Lips and Perioral Area: None, normal Jaw: None, normal Tongue: None, normal,Extremity Movements Upper (arms, wrists, hands, fingers): None, normal Lower (legs, knees, ankles, toes): None, normal, Trunk Movements Neck, shoulders, hips: None, normal, Overall Severity Severity of abnormal movements (highest score from questions above): None, normal Incapacitation due to abnormal movements: None,  normal Patient's awareness of abnormal movements (rate only patient's report): No Awareness, Dental Status Current problems with teeth and/or dentures?: No Does patient usually wear dentures?: No  CIWA:    COWS:     Musculoskeletal: Strength & Muscle Tone: within normal limits Gait & Station: normal Patient leans: N/A  Psychiatric Specialty Exam: Review of Systems  Constitutional: Negative.   HENT: Negative.    Eyes: Negative.   Respiratory: Negative.   Cardiovascular: Negative.   Gastrointestinal: Negative.   Genitourinary: Negative.   Musculoskeletal: Negative.   Skin: Negative.   Neurological: Negative.   Endo/Heme/Allergies: Negative.   Psychiatric/Behavioral: Positive for depression and substance abuse. Negative for suicidal ideas, hallucinations and memory loss. The patient is not nervous/anxious and does not have insomnia.     Blood pressure 158/90, pulse 61, temperature 98 F (36.7 C), temperature source Oral, resp. rate 20, height '5\' 2"'  (1.575 m), weight 81.647 kg (180 lb), SpO2 100 %.Body mass index is 32.91 kg/(m^2).  General Appearance: Well Groomed  Engineer, water::  Good  Speech:  Clear and Coherent  Volume:  Normal  Mood:  Euthymic  Affect:  Congruent  Thought Process:  Linear  Orientation:  Full (Time, Place, and Person)  Thought Content:  Hallucinations: None  Suicidal Thoughts:  No  Homicidal Thoughts:  No  Memory:  Immediate;   Fair Recent;   Fair Remote;   Good  Judgement:  Fair  Insight:  Fair  Psychomotor Activity:  Normal  Concentration:  Fair  Recall:  [place] of Knowledge:Good  Language: Good  Akathisia:  No  Handed:    AIMS (if indicated):     Assets:  Communication Skills Desire for Improvement Financial Resources/Insurance Housing Intimacy Social Support  ADL's:  Intact  Cognition: WNL  Sleep:  Number of Hours: 5   Treatment Plan Summary: Daily contact with patient to assess and evaluate symptoms and progress in treatment and Medication management   45 years old [name] with a long history of depression. Admitted from our emergency department due to suicidal ideation. The patient has a multitude of medical problems that worsening her prognosis. The patient has not been compliant with some of the medications prescribed by her cardiologist such a score rate, Plavix and Imdur. Patient reports no improvement in her symptomatology with  current medication management to her outpatient psychiatrist. Patient reports serious suicidal attempt 2 years ago.  Major depressive disorder: Continue citalopram 40 mg daily and Abilify, which has been increased from 2 mg to 5 mg. Primary psychiatrist in the outpatient clinic brought up the possibility of possible ECT as patient has failed treatment with antidepressants. High suspicion the patient has not been compliant. She also was discharged from the therapist in our clinic due to missing 3 appointments.  Initially would consider ECT however this patient has several medical conditions that increase the risk for complications. Also were highly suspect she was noncompliant with medications and family has reported she has been abusing medications. ECT will not be pursued further.  Also found to have vitamin B12 low levels which is likely contributing to depression.  Insomnia: Continue trazodone 100 mg by mouth daily at bedtime. Slept 5 hours last night  Diabetes type 2: Continue  Invokana. Capillary blood glucose will be checked 4 times a day. I ordered sliding scale insulin. Metformin will be d/c due to elevated Cr.  Labs were reviewed with pharmacies who is states is okay to continue in the current with current creatinine and GFR.  Hemoglobin A1c was 5.8 in December.  Hypertension/ischemic cardiomyopathy/congestive heart failure: Patient has been is started on Correctol 25 mg by mouth 4 times a day, Cardizem 120 mg daily in 2 or 30 mg daily. Patient is on aspirin 81 mg a day and Plavix 75 mg a day.----- chest pain on 1/14. Troponins were not elevated. Seen by the hospitalist in the morning and then in the evening again on 1/14. Earlier in the day on 1/14 patient had a syncopal episode.  History of 2 cerebrovascular accidents: Continue Plavix and aspirin. Patient suffers from left side hemiparesis. The weakness appears mild, patient does not have any difficulties with gait  Chronic renal  insufficiency stage III: Creatinine on January 15 was 1.33. GFR 45  Dyslipidemia: Continue Crestor 20 mg daily  Hypothyroidism: Continue Synthroid 25 g daily.  TSH 1.7 in December  Neuropathic pain: Continue Lyrica 100 mg by mouth twice a day  Multiple sclerosis: Not on any treatment at this time  Migraines and back pain: Continue baclofen 10 bid prn  GERD: Continue Protonix 40 mg twice a day  B12 def: will replace B12 with inj for 7 days. (5 more days)  UTI: UA shows evidence of UTI, patient is complaining of symptoms, dysuria. I will start her on Septra DS for 3 days.  Diet: Carb control and low sodium  Precautions every 15 minute checks  Vital signs 3 times a day  Labs: Hemoglobin A1c is pending. B12 was less than 300  Imaging:  Head CT: Chronic small vessel disease and  old left thalamic lacunar infarct.   Chest x-ray: wnl  Discharge disposition: Home once a stable  Discharge follow-up: Will continue to follow-up with Dr. Jimmye Norman.  We'll try to set of outpatient psychotherapy   Hildred Priest 02/19/2015, 8:51 AM

## 2015-02-19 NOTE — Progress Notes (Signed)
D: Pt is appropriate and pleasant this evening. States "I feel excellent. I've had energy today and I still do." Denies SI/HI/AVH at this time. Denies pain. Pt CBG taken after snacks tonight and was 360. Pt states she drank some orange juice and had cereal. Pt reports that she feels fine at this time. A: CBG taken twice to verify results. Emotional support and encouragement provided. Medications administered as prescribed. q15 minute safety checks maintained. R: Pt remains free from harm.

## 2015-02-19 NOTE — Progress Notes (Signed)
D: Working on coping skills goal today Patient stated slept good last night .Stated appetite is good and energy level  Is high. Stated concentration is good . Stated on Depression scale 1 , hopeless 1 and anxiety 1 .( low 0-10 high) Denies suicidal  homicidal ideations  .  No auditory hallucinations  No pain concerns . Appropriate ADL'S. Interacting with peers and staff.  A: Encourage patient participation with unit programming . Instruction  Given on  Medication , verbalize understanding. R: Voice no other concerns. Staff continue to monitor

## 2015-02-19 NOTE — Plan of Care (Signed)
Problem: Diagnosis: Increased Risk For Suicide Attempt Goal: LTG-Patient Will Report Improved Mood and Deny Suicidal LTG (by discharge) Patient will report improved mood and deny suicidal ideation.  Outcome: Progressing Pt reports that she has felt great today and denies SI Goal: STG-Patient Will Comply With Medication Regime Outcome: Progressing Pt taking medications as prescribed

## 2015-02-20 ENCOUNTER — Telehealth: Payer: Self-pay | Admitting: Family Medicine

## 2015-02-20 LAB — BASIC METABOLIC PANEL
ANION GAP: 9 (ref 5–15)
BUN: 24 mg/dL — AB (ref 6–20)
CALCIUM: 8.6 mg/dL — AB (ref 8.9–10.3)
CO2: 24 mmol/L (ref 22–32)
Chloride: 105 mmol/L (ref 101–111)
Creatinine, Ser: 1.64 mg/dL — ABNORMAL HIGH (ref 0.44–1.00)
GFR calc Af Amer: 42 mL/min — ABNORMAL LOW (ref >60)
GFR, EST NON AFRICAN AMERICAN: 37 mL/min — AB (ref >60)
Glucose, Bld: 233 mg/dL — ABNORMAL HIGH (ref 65–99)
POTASSIUM: 4.4 mmol/L (ref 3.5–5.1)
SODIUM: 138 mmol/L (ref 135–145)

## 2015-02-20 NOTE — Plan of Care (Signed)
Problem: Ineffective individual coping Goal: LTG: Patient will report a decrease in negative feelings Outcome: Progressing Attending unit programing  Voice of benefits  Less negativity

## 2015-02-20 NOTE — Progress Notes (Signed)
Recreation Therapy Notes  INPATIENT RECREATION TR PLAN  Patient Details Name: [name] MRN: [id number] DOB: 1971/01/05 Today's Date: 02/20/2015  Rec Therapy Plan Is patient appropriate for Therapeutic Recreation?: Yes Treatment times per week: At least once a week TR Treatment/Interventions: 1:1 session, Group participation (Comment) (Appropriate participation in daily recreation therapy tx)  Discharge Criteria Pt will be discharged from therapy if:: Treatment goals are met, Discharged Treatment plan/goals/alternatives discussed and agreed upon by:: Patient/family  Discharge Summary Short term goals set: See Care Plan Short term goals met: Complete Progress toward goals comments: One-to-one attended Which groups?: Wellness, Other (Comment) (Self-expression) One-to-one attended: Self-esteem, stress management, anger management Reason goals not met: N/A Therapeutic equipment acquired: None Reason patient discharged from therapy: Discharge from hospital Pt/family agrees with progress & goals achieved: Yes Date patient discharged from therapy: 02/20/15   Leonette Monarch, LRT/CTRS 02/20/2015, 5:07 PM
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
## 2015-02-20 NOTE — BHH Suicide Risk Assessment (Signed)
[facility] Discharge Suicide Risk Assessment   Principal Problem: Severe recurrent major depression without psychotic features [facility]) Discharge Diagnoses:  Patient Active Problem List   Diagnosis Date Noted  . Diabetes type 2, controlled ([place]) [E11.9] 02/19/2015  . Vitamin B12 deficiency [E53.8] 02/18/2015  . Non compliance w medication regimen [Z91.14] 02/18/2015  . Misuse of medications for pain [F19.10] 02/18/2015  . Chest pain [R07.9] 02/16/2015  . Severe recurrent major depression without psychotic features ([place]) [F33.2] 02/15/2015  . Helicobacter pylori infection [B96.81] 11/23/2014  . Hemiparesis, left ([place]) [G81.94] 11/23/2014  . Ischemic cardiomyopathy [I25.5]   . Benign neoplasm of colon [D12.6] 11/20/2014  . Coronary artery disease involving native coronary artery of native heart with angina pectoris with documented spasm ([place]) [I25.111]   . Malignant melanoma ([place]) [C43.9] 08/25/2014  . Chronic kidney disease (CKD), stage III (moderate) [N18.3] 08/25/2014  . Chronic systolic CHF (congestive heart failure) ([place]) [I50.22]   . Incomplete bladder emptying [R33.9] 07/12/2014  . Adult hypothyroidism [E03.9] 12/30/2013  . Aberrant subclavian artery [Q27.8] 11/17/2013  . Multiple sclerosis ([place]) [G35] 11/02/2013  . Headache, migraine [G43.909] 05/29/2013  . Hyperlipidemia [E78.5]   . GERD (gastroesophageal reflux disease) [K21.9]   . Neuropathy ([place]) [G62.9] 01/02/2011  . CVA (cerebral vascular accident) ([place]) [I63.9] 06/21/2008  . Essential hypertension [I10] 05/01/2008    Total Time spent with patient: 30 minutes    Psychiatric Specialty Exam: ROS                                                         Mental Status Per Nursing Assessment::   On Admission:  Suicidal ideation indicated by patient, Suicide plan, Self-harm thoughts, Self-harm behaviors  Demographic Factors:  Caucasian  Loss Factors: Decline in physical health  Historical  Factors: Prior suicide attempts and Impulsivity  Risk Reduction Factors:   Sense of responsibility to family, Living with another person, especially a relative, Positive social support and Positive therapeutic relationship  Continued Clinical Symptoms:  Chronic Pain Previous Psychiatric Diagnoses and Treatments Medical Diagnoses and Treatments/Surgeries  Cognitive Features That Contribute To Risk:  None    Suicide Risk:  Minimal: No identifiable suicidal ideation.  Patients presenting with no risk factors but with morbid ruminations; may be classified as minimal risk based on the severity of the depressive symptoms  Follow-up Information    Follow up with [facility].   Why:  Please arrive at the walk-in clinic between the hours of 9am-2:30pm for an assessment for meidcation managment and therapy.   Contact information:   [address] [place], [place] [address] Phone: [phone] fax:        Hildred Priest, MD 02/20/2015, 8:44 AM

## 2015-02-20 NOTE — Progress Notes (Signed)
D: Pt is very pleasant this evening. Reports that she is experiencing improved mood after receiving vitamin B12 injections. Pt discusses with Probation officer the plan that is set in place for her to continue receiving these injections after d/c. Pt denies SI/HI/AVH at this time. C/o back pain which she has been receiving PRN baclofen for.  A: Emotional support and encouragement provided. Medications given as prescribed. q15 minute safety checks maintained. R: Pt remains free from harm.

## 2015-02-20 NOTE — Telephone Encounter (Signed)
Pt is being discharged today from [facility] for depression.  I have scheduled a hospital follow up appointment/MW
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
## 2015-02-20 NOTE — Plan of Care (Signed)
Problem: Diagnosis: Increased Risk For Suicide Attempt Goal: LTG-Patient Will Show Positive Response to Medication LTG (by discharge) : Patient will show positive response to medication and will participate in the development of the discharge plan.  Outcome: Progressing Pt reports increased mood which she attributes to vitamin B12 injections  Problem: Alteration in mood Goal: LTG-Patient reports reduction in suicidal thoughts (Patient reports reduction in suicidal thoughts and is able to verbalize a safety plan for whenever patient is feeling suicidal)  Outcome: Progressing Pt denies SI at this time

## 2015-02-20 NOTE — BHH Group Notes (Signed)
[place] Group Notes:  (Nursing/MHT/Case Management/Adjunct)  Date:  02/20/2015  Time:  12:18 PM  Type of Therapy:  Psychoeducational Skills  Participation Level:  Active  Participation Quality:  Appropriate, Attentive and Sharing  Affect:  Appropriate  Cognitive:  Appropriate  Insight:  Appropriate  Engagement in Group:  Engaged  Modes of Intervention:  Discussion, Education and Support  Summary of Progress/Problems:  [name] 02/20/2015, 12:18 PM

## 2015-02-20 NOTE — Plan of Care (Signed)
Problem: [facility] Participation in Recreation Therapeutic Interventions Goal: STG-Patient will demonstrate improved self esteem by identif STG: Self-Esteem - Within 4 treatment sessions, patient will verbalize at least 5 positive affirmation statements in each of 2 treatment sessions to increase self-esteem post d/c.  Outcome: Completed/Met Date Met:  02/20/15 Treatment Session 2; Completed 2 out of 2: At approximately 11:50 am, LRT met with patient in craft room. Patient verbalized 5 positive affirmation statements. Patient reported it felt "good". LRT encouraged patient to continue saying positive affirmation statements. Intervention Used: I Am statements  Leonette Monarch, LRT/CTRS 01.18.17 12:01 pm Goal: STG-Patient will identify at least five coping skills for ** STG: Coping Skills - Within 4 treatment sessions, patient will verbalize at least 5 coping skills for anger in each of 2 treatment sessions to increase anger management skills post d/c.  Outcome: Completed/Met Date Met:  02/20/15 Treatment Session 2; Completed 2 out of 2: At approximately 11:50 am, LRT met with patient in craft room. Patient verbalized 5 coping skills for anger. LRT encouraged patient to use her coping skills when she felt herself getting angry to help calm herself down. Intervention Used: Coping Skills worksheet  Leonette Monarch, LRT/CTRS 01.18.17 12:02 pm Goal: STG-Other Recreation Therapy Goal (Specify) STG: Stress Management - Within 4 treatment sessions, patient will verbalize understanding of the stress management techniques in each of 2 treatment sessions to increase stress management skills post d/c.  Outcome: Completed/Met Date Met:  02/20/15 Treatment Session 2; Completed 2 out of 2: At approximately 11:50 am, LRT met with patient in craft room. Patient reported she read over and practiced the stress management techniques. Patient verbalized understanding and reported the techniques were helpful. LRT  encouraged patient to continue practicing the stress management techniques. Intervention Used: Stress Management handouts  Leonette Monarch, LRT/CTRS 01.18.17 12:03 pm

## 2015-02-20 NOTE — Progress Notes (Addendum)
  [facility] Adult Case Management Discharge Plan :  Will you be returning to the same living situation after discharge:  Yes,  pt will be returning home to [place], [place] to live with her husband At discharge, do you have transportation home?: Yes,  pt will be picked up by her husband Do you have the ability to pay for your medications: Yes,  pt will be provided with prescriptions at discharge  Release of information consent forms completed and in the chart;  Patient's signature needed at discharge.  Patient to Follow up at: Follow-up Information    Follow up with [facility].   Why:  Please arrive at the walk-in clinic between the hours of 9am-2:30pm for an assessment for therapy.   Contact information:   [address] [place], [place] [address] Phone: [phone] Fax: [phone]       Follow up with [facility] On 02/22/2015.   Why:  Please arrive for your appointment with Dr. [name] at Friday January 20th at 10:00 am    Contact information:   [address] [place] [address] [place] [address] Ph: [phone] Fax: [phone]       Follow up with [facility].   Why:  Please arrive for your appointment on Monday February 6th, 2017 at 8:45am for your hospital follow up appointment   Contact information:   [place], [place] [address] Ph: [phone] Fax: [phone]      Next level of care provider has access to [place] and Suicide Prevention discussed: Yes,  completed with pt  Have you used any form of tobacco in the last 30 days? (Cigarettes, Smokeless Tobacco, Cigars, and/or Pipes): No  Has patient been referred to the Quitline?: N/A patient is not a smoker  Patient has been referred for addiction treatment: Pt. refused referral  [name] 02/20/2015, 11:22 AM

## 2015-02-21 ENCOUNTER — Telehealth: Payer: Self-pay

## 2015-02-21 LAB — GLUCOSE, CAPILLARY: Glucose-Capillary: 97 mg/dL (ref 65–99)

## 2015-02-21 NOTE — Telephone Encounter (Signed)
Dr. Jerilee Hoh from [facility] called stating that they are discharging the patient from the hospital and she reports that the patient's GFR was low, and that she recommends that patient be discontinued off of the [place]. SHe reports that Invokana could cause more damage to her kidneys.

## 2015-02-22 ENCOUNTER — Ambulatory Visit (INDEPENDENT_AMBULATORY_CARE_PROVIDER_SITE_OTHER): Payer: BC Managed Care – PPO | Admitting: Family Medicine

## 2015-02-22 ENCOUNTER — Encounter: Payer: Self-pay | Admitting: Family Medicine

## 2015-02-22 VITALS — BP 140/80 | HR 79 | Temp 98.0°F | Resp 16 | Ht 64.25 in | Wt 183.0 lb

## 2015-02-22 DIAGNOSIS — F32A Depression, unspecified: Secondary | ICD-10-CM

## 2015-02-22 DIAGNOSIS — Z794 Long term (current) use of insulin: Secondary | ICD-10-CM | POA: Diagnosis not present

## 2015-02-22 DIAGNOSIS — E1140 Type 2 diabetes mellitus with diabetic neuropathy, unspecified: Secondary | ICD-10-CM

## 2015-02-22 DIAGNOSIS — R300 Dysuria: Secondary | ICD-10-CM

## 2015-02-22 DIAGNOSIS — F329 Major depressive disorder, single episode, unspecified: Secondary | ICD-10-CM | POA: Diagnosis not present

## 2015-02-22 LAB — POCT URINALYSIS DIPSTICK
BILIRUBIN UA: NEGATIVE
Blood, UA: NEGATIVE
Glucose, UA: 2000
KETONES UA: NEGATIVE
Leukocytes, UA: NEGATIVE
Nitrite, UA: NEGATIVE
PH UA: 6.5
SPEC GRAV UA: 1.015
Urobilinogen, UA: 2

## 2015-02-22 MED ORDER — METFORMIN HCL 1000 MG PO TABS: 1000.0000 mg | ORAL_TABLET | Freq: Every day | ORAL | Status: DC

## 2015-02-22 MED ORDER — FLUCONAZOLE 150 MG PO TABS
150.0000 mg | ORAL_TABLET | Freq: Once | ORAL | Status: DC
Start: 2015-02-22 — End: 2015-03-22

## 2015-02-22 MED ORDER — CANAGLIFLOZIN 100 MG PO TABS: 100.0000 mg | ORAL_TABLET | Freq: Every day | ORAL | Status: DC

## 2015-02-22 NOTE — Progress Notes (Signed)
Patient: [name] Female    DOB: Feb 12, 1970   45 y.o.   MRN: [id number] Visit Date: 02/22/2015  Today's Provider: [name], MD   Chief Complaint  Patient presents with  . Hospitalization Follow-up   Subjective:    HPI   Follow up Hospitalization  Patient was admitted to [facility] on 02/15/2015 and discharged on 02/20/2015. She was treated for depression. Treatment for this included; admitted to behavorial healthcare for assessment for medication management and therapy. Abilify was increased from 2.5 mg to 5 mg qd. Patient to continue citalopram 40 mg qd. She reports good compliance with treatment. She reports this condition is Improved.  ----------------------------------------------------------------------    Follow up diabetes She states her home blood sugars have been very good. Her kidney functions had been doing better, but eGFR was noted to be in the 30s and 40s with recent hospitalizations. Metformin was discontinued and was advised to consider stopping Invokana. She had dramatic improvement in blood sugars when Invokana was first started.   She also states she has had frequent urination and burning for a couple weeks and is just finishing prescription for sulfa antibiotic.   BMP Latest Ref Rng 02/20/2015 02/17/2015 02/16/2015  Glucose 65 - 99 mg/dL 233(H) 80 196(H)  BUN 6 - 20 mg/dL 24(H) 21(H) 19  Creatinine 0.44 - 1.00 mg/dL 1.64(H) 1.33(H) 1.77(H)  BUN/Creat Ratio 9 - 23 - - -  Sodium 135 - 145 mmol/L 138 142 138  Potassium 3.5 - 5.1 mmol/L 4.4 4.2 4.4  Chloride 101 - 111 mmol/L 105 110 105  CO2 22 - 32 mmol/L _0 Calcium 8.9 - 10.3 mg/dL 8.6(L) 8.9 9.4    Lab Results  Component Value Date   HGBA1C 5.8 01/03/2015    Allergies  Allergen Reactions  . Tramadol Other (See Comments)    Reaction:  Burning of pts mouth and confusion    Previous Medications   ARIPIPRAZOLE (ABILIFY) 5 MG TABLET    Take 1 tablet (5 mg total) by mouth daily.   ASPIRIN EC 81 MG TABLET    Take 1 tablet (81 mg total) by mouth daily.   BACLOFEN (LIORESAL) 10 MG TABLET    Take 1 tablet (10 mg total) by mouth 2 (two) times daily as needed for muscle spasms.   CANAGLIFLOZIN (INVOKANA) 300 MG TABS TABLET    Take 300 mg by mouth daily.   CARVEDILOL (COREG) 25 MG TABLET    Take 1 tablet (25 mg total) by mouth 2 (two) times daily.   CITALOPRAM (CELEXA) 40 MG TABLET    Take 1 tablet (40 mg total) by mouth daily.   CLOPIDOGREL (PLAVIX) 75 MG TABLET    Take 1 tablet (75 mg total) by mouth daily.   CYANOCOBALAMIN (,VITAMIN B-12,) 1000 MCG/ML INJECTION    Inject 1 mL (1,000 mcg total) into the skin once a week.   DILTIAZEM (CARDIZEM CD) 120 MG 24 HR CAPSULE    Take 1 capsule (120 mg total) by mouth daily.   ISOSORBIDE MONONITRATE (IMDUR) 30 MG 24 HR TABLET    Take 1 tablet (30 mg total) by mouth daily.   LEVOTHYROXINE (SYNTHROID, LEVOTHROID) 25 MCG TABLET    Take 25 mcg by mouth daily before breakfast.   PANTOPRAZOLE (PROTONIX) 40 MG TABLET    Take 40 mg by mouth 2 (two) times daily.    PREGABALIN (LYRICA) 100 MG CAPSULE    Take 1 capsule (100 mg total)  by mouth 2 (two) times daily.   ROSUVASTATIN (CRESTOR) 20 MG TABLET    Take 1 tablet (20 mg total) by mouth daily at 6 PM.   SULFAMETHOXAZOLE-TRIMETHOPRIM (BACTRIM DS,SEPTRA DS) 800-160 MG TABLET    Take 1 tablet by mouth every 12 (twelve) hours.   TRAZODONE (DESYREL) 100 MG TABLET    Take 1 tablet (100 mg total) by mouth at bedtime as needed for sleep.    Review of Systems  Constitutional: Positive for fatigue. Negative for fever, chills and appetite change.  Respiratory: Negative for chest tightness and shortness of breath.   Cardiovascular: Negative for chest pain and palpitations.  Gastrointestinal: Positive for nausea. Negative for vomiting and abdominal pain.  Neurological: Negative for dizziness and weakness.    Social History  Substance Use Topics  . Smoking status: Former Smoker    Types: Cigarettes     Quit date: 08/31/1994  . Smokeless tobacco: Never Used     Comment: quit 28 years ago  . Alcohol Use: No   Objective:   BP 140/80 mmHg  Pulse 79  Temp(Src) 98 F (36.7 C) (Oral)  Resp 16  Ht 5' 4.25" (1.632 m)  Wt 183 lb (83.008 kg)  BMI 31.17 kg/m2  SpO2 99%  LMP  (Approximate)  Physical Exam    General Appearance:    Alert, cooperative, no distress  Eyes:    PERRL, conjunctiva/corneas clear, EOM's intact       Lungs:     Clear to auscultation bilaterally, respirations unlabored  Heart:    Regular rate and rhythm  Neurologic:   Awake, alert, oriented x 3. No apparent focal neurological           defect.         Results for orders placed or performed in visit on 02/22/15  POCT urinalysis dipstick  Result Value Ref Range   Color, UA Yellow    Clarity, UA Clear    Glucose, UA 2000 or more    Bilirubin, UA Neg    Ketones, UA Neg    Spec Grav, UA 1.015    Blood, UA Neg    pH, UA 6.5    Protein, UA 30+    Urobilinogen, UA 2.0    Nitrite, UA Neg    Leukocytes, UA Negative Negative       Assessment & Plan:     1. Dysuria Normal u/a. She does report symptoms of vaginal yeast infection which may be responsible of symptoms - POCT urinalysis dipstick - fluconazole (DIFLUCAN) 150 MG tablet; Take 1 tablet (150 mg total) by mouth once.  Dispense: 1 tablet; Refill: 3  2. Controlled type 2 diabetes mellitus with diabetic neuropathy, with long-term current use of insulin (HCC) Last A1c was very good, but sugars running hight since metformin stopped. Will reduce Invokana due to low eGFR and start back on lower dose of metformin. She has appointment with nephrology next month and will follow up here in about a month. Call if sugars become elevated above 200.   - canagliflozin (INVOKANA) 100 MG TABS tablet; Take 1 tablet (100 mg total) by mouth daily before breakfast.  Dispense: 30 tablet; Refill: 5 - metFORMIN (GLUCOPHAGE) 1000 MG tablet; Take 1 tablet (1,000 mg total) by  mouth daily.  Dispense: 1 tablet; Refill: 1   3. Depression Is doing much better and scheduled for follow up with psychiatrist.       [name], MD  [place]

## 2015-02-22 NOTE — Patient Instructions (Signed)
You may take 1 1/2 tablets of trazodone at bedtime, or try taking an extra 1/2 tablet if you wake in the middle of the night

## 2015-02-23 LAB — GLUCOSE, CAPILLARY
Glucose-Capillary: 360 mg/dL — ABNORMAL HIGH (ref 65–99)
Glucose-Capillary: 406 mg/dL — ABNORMAL HIGH (ref 65–99)

## 2015-03-01 ENCOUNTER — Telehealth: Payer: Self-pay | Admitting: Psychiatry

## 2015-03-01 ENCOUNTER — Other Ambulatory Visit: Payer: Self-pay | Admitting: Family Medicine

## 2015-03-01 MED ORDER — TRAZODONE HCL 100 MG PO TABS: 200.0000 mg | ORAL_TABLET | Freq: Every evening | ORAL | Status: DC | PRN

## 2015-03-01 NOTE — Telephone Encounter (Signed)
Advised patient as below. Patient reports that she will call Dr. Grace Bushy office.

## 2015-03-01 NOTE — Telephone Encounter (Signed)
This is prescribed by Dr. Jimmye Norman.

## 2015-03-01 NOTE — Telephone Encounter (Signed)
Spoke with patient today. She indicated that her primary care had advised her she could increase her trazodone up to 200 mg at bedtime. Patient states is done this and it is helping her sleep better. I have now ordered trazodone 100 mg, #60 with 4 refills. Patient is scheduled to follow-up with me on 03/11/2015.

## 2015-03-01 NOTE — Telephone Encounter (Signed)
Pt contacted office for refill request on the following medications:  traZODone (DESYREL) 100 MG tablet.  Pt states per Dr Caryn Section she is taking 2 at night.  [facility] [place].  CB#[phone]/MW

## 2015-03-02 LAB — GLUCOSE, CAPILLARY: Glucose-Capillary: 127 mg/dL — ABNORMAL HIGH (ref 65–99)

## 2015-03-05 ENCOUNTER — Encounter: Payer: Self-pay | Admitting: Psychiatry

## 2015-03-07 ENCOUNTER — Ambulatory Visit: Payer: Self-pay | Admitting: Nurse Practitioner

## 2015-03-11 ENCOUNTER — Other Ambulatory Visit: Payer: Self-pay | Admitting: Psychiatry

## 2015-03-11 ENCOUNTER — Ambulatory Visit (INDEPENDENT_AMBULATORY_CARE_PROVIDER_SITE_OTHER): Payer: BC Managed Care – PPO | Admitting: Psychiatry

## 2015-03-11 ENCOUNTER — Encounter: Payer: Self-pay | Admitting: Psychiatry

## 2015-03-11 VITALS — BP 122/84 | HR 81 | Temp 98.6°F | Ht 64.25 in | Wt 185.4 lb

## 2015-03-11 DIAGNOSIS — F331 Major depressive disorder, recurrent, moderate: Secondary | ICD-10-CM

## 2015-03-11 MED ORDER — ESZOPICLONE 2 MG PO TABS: 2.0000 mg | ORAL_TABLET | Freq: Every evening | ORAL | Status: DC | PRN

## 2015-03-11 MED ORDER — ARIPIPRAZOLE 5 MG PO TABS: 5.0000 mg | ORAL_TABLET | Freq: Every day | ORAL | Status: DC

## 2015-03-11 NOTE — Progress Notes (Signed)
[place] MD/PA/NP OP Progress Note  03/11/2015 9:07 AM [place]  MRN:  [id number]  Subjective:  Patient returns for follow-up of her major depressive disorder. This is patient's first follow-up appointment since the psychiatric hospitalization earlier this month for suicidal ideation. She states her main complaint today is insomnia. She states she's been taking the trazodone 200 mg but that she'll falsely for an hour and then she states she is up the rest of the night. She principally been on Ambien however her son and husband stated that she was talking nonsense on Ambien.  I did discuss with her suicidal ideation and she indicates she has not had any of that since her hospitalization. She discusses that during the hospitalization she felt she was not supported by her husband but more recently she started to feel better support. During the hospitalization they continued her largely on her citalopram but increased her Abilify from 2 mg to 5 mg for augmentation purposes. She feels like her depression is better.  Patient expresses concern that if her B12 shots are discontinued she will lose her energy. I indicated that typically people don't continue to get these once their levels are normal. I encouraged her to discuss things further her primary care physician. However I've given her a laboratory slip to have a B12 level obtained.  I did discuss with patient that her husband raise the issue of the patient was taking a lot of different medications. Patient clarified that this is in relation to the medicine ibuprofen where she'll take at 4 of the tablets as opposed to the instructed to tablets. I encouraged her to follow all prescriptions and instructions on bottles.   Chief Complaint: sleep  Visit Diagnosis:     ICD-9-CM ICD-10-CM   1. Major depressive disorder, recurrent episode, moderate (HCC) 296.32 F33.1     Past Medical History:  Past Medical History  Diagnosis Date  . Colon polyp   .  GERD (gastroesophageal reflux disease)   . CAD (coronary artery disease)     a. cath 08/09/2003: mild LAD plaque, no obs dzs noted; b. 12/18/10 stress test w/o ischemia; c. cath 11/2014: LM: nl, mid 85-90% s/p PCI/DES, mid LCX 30%, RCA nl  . CVA (cerebral vascular accident) ([place])     a. 06/2008-TEE nl LV fxn; b. 01/2013 - notes indicate short run of SVT at that time  . Multiple sclerosis ([place]) 2015  . Syncope   . Chronic systolic CHF (congestive heart failure) ([place])     a. echo 01/29/2013 EF 40-45%, basal anteroseptal & mid anteroseptal segments abnl; b echo 05/2012 EF 50-55%, mildly dilated LA, nl RVSP  . Orthostatic hypotension   . Aortic arch aneurysm ([place])   . Ischemic cardiomyopathy   . MS (mitral stenosis)   . Malignant melanoma of skin of scalp ([place])   . Hypertension   . Depression   . Diabetes mellitus without complication ([place])   . Headache     Migrains  . Hyperlipidemia   . CKD (chronic kidney disease), stage IV ([place])   . Hypothyroidism   . Anxiety   . Gastric ulcer 04/27/2011  . Heart attack [facility])     Past Surgical History  Procedure Laterality Date  . Appendectomy    . Right oophorectomy    . Tubal ligation      R tube removed still w/ Left  . Dilation and curettage of uterus    . Gastric bypass  09/2009    [facility]  [facility]   . Esophagogastroduodenoscopy (egd) with propofol N/A 09/14/2014    Procedure: ESOPHAGOGASTRODUODENOSCOPY (EGD) WITH PROPOFOL;  Surgeon: Josefine Class, MD;  Location: [facility] ENDOSCOPY;  Service: Endoscopy;  Laterality: N/A;  . Cardiac catheterization N/A 11/09/2014    Procedure: Coronary Angiography;  Surgeon: Minna Merritts, MD;  Location: [place] CV LAB;  Service: Cardiovascular;  Laterality: N/A;  . Cardiac catheterization N/A 11/12/2014    Procedure: Coronary Stent Intervention;  Surgeon: Isaias Cowman, MD;  Location: [place] CV LAB;  Service: Cardiovascular;  Laterality: N/A;  . Trigger finger release    .  Melanoma excision  2016    Dr. Evorn Gong   Family History:  Family History  Problem Relation Age of Onset  . Hypertension Mother   . Anxiety disorder Mother   . Depression Mother   . Bipolar disorder Mother   . Heart disease Mother   . Hyperlipidemia Mother   . Kidney disease Father   . Heart disease Father   . Hypertension Father   . Diabetes Father   . Stroke Father   . Colon cancer Father     dx in his 52's  . Anxiety disorder Father   . Depression Father   . Skin cancer Father   . Kidney disease Sister   . Thyroid nodules Sister   . Hypertension Sister   . Hypertension Sister   . Diabetes Sister   . Hyperlipidemia Sister   . Depression Sister    Social History:  Social History   Social History  . Marital Status: Married    Spouse Name: N/A  . Number of Children: 1  . Years of Education: N/A   Occupational History  . 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 Disabled through 08/02/2013 per forms 02/03/2013   Social History Main Topics  . Smoking status: Former Smoker    Types: Cigarettes    Quit date: 08/31/1994  . Smokeless tobacco: Never Used     Comment: quit 28 years ago  . Alcohol Use: No  . Drug Use: No  . Sexual Activity: Not Currently    Birth Control/ Protection: None   Other Topics Concern  . Not on file   Social History Narrative   previously did custolial work. 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 Disabled through 08/02/2013 per forms 02/03/13   Additional History:   Assessment:   Musculoskeletal: Strength & Muscle Tone: within normal limits Gait & Station: normal Patient leans: N/A  Psychiatric Specialty Exam: Depression        Associated symptoms include insomnia (Response to temazepam).  Associated symptoms include no suicidal ideas.  Past medical history includes anxiety.   Anxiety Symptoms include insomnia (Response to temazepam).  Patient reports no nervous/anxious behavior or suicidal ideas.      Review of Systems  Psychiatric/Behavioral: Negative for depression, suicidal ideas, hallucinations, memory loss and substance abuse. The patient has insomnia (Response to temazepam). The patient is not nervous/anxious.        Patient describes more of a baseline level of irritability  All other systems reviewed and are negative.   There were no vitals taken for this visit.There is no weight on file to calculate BMI.  General Appearance: Neat and Well Groomed  Eye Contact:  Good  Speech:  Normal Rate  Volume:  Normal  Mood:  depressed  Affect:  Appropriate and  Constricted  Thought Process:  Linear and Logical  Orientation:  Full (Time, Place, and Person)  Thought Content:  Negative  Suicidal Thoughts:  No  Homicidal Thoughts:  No  Memory:  Immediate;   Good Recent;   Good Remote;   Good  Judgement:  Good  Insight:  Good  Psychomotor Activity:  Negative  Concentration:  Good  Recall:  Good  Fund of Knowledge: Good  Language: Good  Akathisia:  Negative  Handed:  Right  AIMS (if indicated):  Done on 02/07/15 and normal  Assets:  Communication Skills Desire for Improvement Social Support  ADL's:  Intact  Cognition: WNL  Sleep:  At times too much   Is the patient at risk to self?  No. Has the patient been a risk to self in the past 6 months?  No. Has the patient been a risk to self within the distant past?  Yes.   November 2015 Is the patient a risk to others?  No. Has the patient been a risk to others in the past 6 months?  No. Has the patient been a risk to others within the distant past?  No.  Current Medications: Current Outpatient Prescriptions  Medication Sig Dispense Refill  . ARIPiprazole (ABILIFY) 5 MG tablet Take 1 tablet (5 mg total) by mouth daily. 30 tablet 3  . aspirin EC 81 MG tablet Take 1 tablet (81 mg total) by mouth daily.    . baclofen (LIORESAL) 10 MG tablet Take 1 tablet (10 mg  total) by mouth 2 (two) times daily as needed for muscle spasms.  0  . canagliflozin (INVOKANA) 100 MG TABS tablet Take 1 tablet (100 mg total) by mouth daily before breakfast. 30 tablet 5  . carvedilol (COREG) 25 MG tablet Take 1 tablet (25 mg total) by mouth 2 (two) times daily. 60 tablet 6  . citalopram (CELEXA) 40 MG tablet Take 1 tablet (40 mg total) by mouth daily. 30 tablet 3  . clopidogrel (PLAVIX) 75 MG tablet Take 1 tablet (75 mg total) by mouth daily. 30 tablet 5  . cyanocobalamin (,VITAMIN B-12,) 1000 MCG/ML injection Inject 1 mL (1,000 mcg total) into the skin once a week. 1 mL 4  . diltiazem (CARDIZEM CD) 120 MG 24 hr capsule Take 1 capsule (120 mg total) by mouth daily. 30 capsule 11  . eszopiclone (LUNESTA) 2 MG TABS tablet Take 1 tablet (2 mg total) by mouth at bedtime as needed for sleep. Take immediately before bedtime 30 tablet 1  . fluconazole (DIFLUCAN) 150 MG tablet Take 1 tablet (150 mg total) by mouth once. 1 tablet 3  . isosorbide mononitrate (IMDUR) 30 MG 24 hr tablet Take 1 tablet (30 mg total) by mouth daily. 30 tablet 5  . levothyroxine (SYNTHROID, LEVOTHROID) 25 MCG tablet Take 25 mcg by mouth daily before breakfast.    . metFORMIN (GLUCOPHAGE) 1000 MG tablet Take 1 tablet (1,000 mg total) by mouth daily. 1 tablet 1  . pantoprazole (PROTONIX) 40 MG tablet Take 40 mg by mouth 2 (two) times daily.     . pregabalin (LYRICA) 100 MG capsule Take 1 capsule (100 mg total) by mouth 2 (two) times daily. 60 capsule 5  . rosuvastatin (CRESTOR) 20 MG tablet Take 1 tablet (20 mg total) by mouth daily at 6 PM. 30 tablet 0  . sulfamethoxazole-trimethoprim (BACTRIM DS,SEPTRA DS) 800-160 MG tablet Take 1 tablet by mouth every 12 (twelve) hours. 6 tablet 0   No current facility-administered medications for this  visit.    Medical Decision Making:  Established Problem, Worsening (2), Review of Medication Regimen & Side Effects (2) and Review of New Medication or Change in Dosage  (2)  Treatment Plan Summary:Medication management and Plan   Major depressive disorder, recurrent, moderate We will continue the patient's citalopram at 40 mg a day and Abilify 5 milligrams daily.  Insomnia-she is not getting a significant response from trazodone 200 mg per Will discontinue that at this time and try Lunesta 2 mg at bedtime as needed  Patient will follow up in 1 month. She's been encouraged call any questions or concerns prior to her next appointment.       Faith Rogue 03/11/2015, 9:07 AM

## 2015-03-12 LAB — VITAMIN B12: Vitamin B-12: >2000 pg/mL — ABNORMAL HIGH (ref 211–946)

## 2015-03-13 ENCOUNTER — Telehealth: Payer: Self-pay | Admitting: Family Medicine

## 2015-03-13 NOTE — Telephone Encounter (Signed)
Returned call. [name] was checking on her husband refill and scheduled an appt for him.

## 2015-03-13 NOTE — Telephone Encounter (Signed)
Pt wants you to call her back regarding a prescription.  She would not give me anymore info.  Her call back is [phone].  Thank sTeri

## 2015-03-14 ENCOUNTER — Telehealth: Payer: Self-pay | Admitting: Psychiatry

## 2015-03-14 ENCOUNTER — Ambulatory Visit (INDEPENDENT_AMBULATORY_CARE_PROVIDER_SITE_OTHER): Payer: BC Managed Care – PPO | Admitting: Physician Assistant

## 2015-03-14 ENCOUNTER — Encounter: Payer: Self-pay | Admitting: Physician Assistant

## 2015-03-14 ENCOUNTER — Other Ambulatory Visit
Admission: RE | Admit: 2015-03-14 | Discharge: 2015-03-14 | Disposition: A | Discharge status: Home / Self Care | Payer: BC Managed Care – PPO | Source: Ambulatory Visit | Attending: Physician Assistant | Admitting: Physician Assistant

## 2015-03-14 ENCOUNTER — Other Ambulatory Visit: Payer: Self-pay | Admitting: Student

## 2015-03-14 VITALS — BP 138/94 | HR 65 | Ht 63.0 in | Wt 185.5 lb

## 2015-03-14 DIAGNOSIS — I25111 Atherosclerotic heart disease of native coronary artery with angina pectoris with documented spasm: Secondary | ICD-10-CM | POA: Diagnosis not present

## 2015-03-14 DIAGNOSIS — I255 Ischemic cardiomyopathy: Secondary | ICD-10-CM | POA: Diagnosis not present

## 2015-03-14 DIAGNOSIS — I2510 Atherosclerotic heart disease of native coronary artery without angina pectoris: Secondary | ICD-10-CM | POA: Diagnosis not present

## 2015-03-14 DIAGNOSIS — M7581 Other shoulder lesions, right shoulder: Secondary | ICD-10-CM

## 2015-03-14 DIAGNOSIS — R55 Syncope and collapse: Secondary | ICD-10-CM | POA: Insufficient documentation

## 2015-03-14 DIAGNOSIS — E785 Hyperlipidemia, unspecified: Secondary | ICD-10-CM

## 2015-03-14 DIAGNOSIS — Z8673 Personal history of transient ischemic attack (TIA), and cerebral infarction without residual deficits: Secondary | ICD-10-CM

## 2015-03-14 DIAGNOSIS — I10 Essential (primary) hypertension: Secondary | ICD-10-CM

## 2015-03-14 LAB — CBC WITH DIFFERENTIAL/PLATELET
BASOS ABS: 0.1 10*3/uL (ref 0–0.1)
BASOS PCT: 1 %
EOS ABS: 0.2 10*3/uL (ref 0–0.7)
Eosinophils Relative: 2 %
HEMATOCRIT: 32.9 % — AB (ref 35.0–47.0)
HEMOGLOBIN: 10.4 g/dL — AB (ref 12.0–16.0)
Lymphocytes Relative: 20 %
Lymphs Abs: 1.8 10*3/uL (ref 1.0–3.6)
MCH: 21.8 pg — ABNORMAL LOW (ref 26.0–34.0)
MCHC: 31.7 g/dL — AB (ref 32.0–36.0)
MCV: 68.9 fL — ABNORMAL LOW (ref 80.0–100.0)
MONOS PCT: 6 %
Monocytes Absolute: 0.6 10*3/uL (ref 0.2–0.9)
NEUTROS ABS: 6.4 10*3/uL (ref 1.4–6.5)
NEUTROS PCT: 71 %
Platelets: 171 10*3/uL (ref 150–440)
RBC: 4.77 MIL/uL (ref 3.80–5.20)
RDW: 17.3 % — ABNORMAL HIGH (ref 11.5–14.5)
WBC: 9 10*3/uL (ref 3.6–11.0)

## 2015-03-14 LAB — BASIC METABOLIC PANEL
ANION GAP: 9 (ref 5–15)
BUN: 12 mg/dL (ref 6–20)
CALCIUM: 8.9 mg/dL (ref 8.9–10.3)
CHLORIDE: 104 mmol/L (ref 101–111)
CO2: 25 mmol/L (ref 22–32)
CREATININE: 1.39 mg/dL — AB (ref 0.44–1.00)
GFR calc non Af Amer: 45 mL/min — ABNORMAL LOW (ref >60)
GFR, EST AFRICAN AMERICAN: 52 mL/min — AB (ref >60)
Glucose, Bld: 104 mg/dL — ABNORMAL HIGH (ref 65–99)
Potassium: 4.1 mmol/L (ref 3.5–5.1)
SODIUM: 138 mmol/L (ref 135–145)

## 2015-03-14 LAB — MAGNESIUM: MAGNESIUM: 1.8 mg/dL (ref 1.7–2.4)

## 2015-03-14 LAB — TSH: TSH: 2.18 u[IU]/mL (ref 0.350–4.500)

## 2015-03-14 NOTE — Telephone Encounter (Signed)
Left patient a message informing her that her B12 levels were high at than 2000. I requested she call me if she had any questions or concerns. AW

## 2015-03-14 NOTE — Progress Notes (Signed)
Cardiology Office Note Date:  03/14/2015  Patient ID:  [name], [name] November 23, 1970, MRN [id number] PCP:  [name], MD  Cardiologist:  Dr. [name], MD    Chief Complaint: Syncope  History of Present Illness: [name] is a 45 y.o. female with history of CAD s/p PCI/DES to LAD in the settin of mildly elevated troponin of a peak of 0.04 with chest pain, numerous prior strokes, chronic systolic CHF, aortic arch aneurysm, aberrant left subclavian artery s/p bypass surgery, s/p gastric bypass surgery, CKD stage III, multiple sclerosis, orthostatic hypotension, HTN, HLD, prior syncope, hypothyroidism, migraine disorder, anxiety, and depression who presents for evaluation of syncope.   She has a history of chest pain dating back to 2005 with cardiac cath in July 2005 showed nonobstructive CAD with mild plaque in the LAD. Nuclear stress test in 2012 in the setting of chest pain showed no ischemia. She underwent CT in 11/2013 showing right aortic arch with a large retro-esophageal vascular segment with persistent dorsal aortic segment giving off an aberrant left subclavian artery, and is s/p left carotid to subclavian artery bypass with subclavian artery ligation. The large caliber retroaortic segment measured 18 mm compared to the ascending aorta measured 22 mm. This large retroesophageal aortic segment along with the partially calcified atretic ductus ligament between the left aberrant subclavian artery and the left pulmonary artery created an almost complete vascular ring. This was felt to be the likelycause ofpatient's prior dysphagia. She was admitted to [facility] in October 2016 with chest pain. Troponin with a peak of 0.04. She underwent nuclear stress test that was overall suboptimal due to GI uptake, EF 45-54%, no ST segmanet changes, low risk. She continued to have chest pain and underwent cardiac cath that showed proximal LAD 85% stenosis s/p PCI/DES (staged procedure given her renal  dysfunction), distal LAD 30% stenosis, proximal LCx 30%. In follow up on 10/21 she reported palpitations, 48 hour Holter showed sinus rhythm with rare PACs and PVCs. In January she was having issues with lower extremity swelling, being treated with compression hose. As of late, she has had issues with depression and is seeing behavioral health for this.   She has had 3 syncopal episodes over since January and 2 presyncopal episodes. Episodes occur when she is already standing up. There is no positional change occuring. No associated chest pain, SOB, nausea, vomiting, or diaphoresis. She does note some tachy-palpitations, though is uncertain if these are 2/2 anxiety or not. Her weight has been stable. She is tolerating her medications without issues. Immediately following these events her BP and blood sugar have been checked and found to be normal per her report. She denies any loss of bowel or bladder function with these events. No post ictal period.    Past Medical History  Diagnosis Date  . Colon polyp   . GERD (gastroesophageal reflux disease)   . CAD (coronary artery disease)     a. cath 08/09/2003: mild LAD plaque, no obs dzs noted; b. 12/18/10 stress test w/o ischemia; c. cath 11/2014: LM: nl, mid 85-90% s/p PCI/DES, mid LCX 30%, RCA nl  . CVA (cerebral vascular accident) ([place])     a. 06/2008-TEE nl LV fxn; b. 01/2013 - notes indicate short run of SVT at that time  . Multiple sclerosis ([place]) 2015  . Syncope   . Chronic systolic CHF (congestive heart failure) ([place])     a. echo 01/29/2013 EF 40-45%, basal anteroseptal & mid anteroseptal segments abnl; b  echo 05/2012 EF 50-55%, mildly dilated LA, nl RVSP  . Orthostatic hypotension   . Aortic arch aneurysm ([place])   . Ischemic cardiomyopathy   . MS (mitral stenosis)   . Malignant melanoma of skin of scalp ([place])   . Hypertension   . Depression   . Diabetes mellitus without complication ([place])   . Headache     Migrains  . Hyperlipidemia   . CKD  (chronic kidney disease), stage IV ([place])   . Hypothyroidism   . Anxiety   . Gastric ulcer 04/27/2011  . Heart attack [facility])     Past Surgical History  Procedure Laterality Date  . Appendectomy    . Right oophorectomy    . Tubal ligation      R tube removed still w/ Left  . Dilation and curettage of uterus    . Gastric bypass  09/2009    [facility]   . Esophagogastroduodenoscopy (egd) with propofol N/A 09/14/2014    Procedure: ESOPHAGOGASTRODUODENOSCOPY (EGD) WITH PROPOFOL;  Surgeon: Josefine Class, MD;  Location: [facility] ENDOSCOPY;  Service: Endoscopy;  Laterality: N/A;  . Cardiac catheterization N/A 11/09/2014    Procedure: Coronary Angiography;  Surgeon: Minna Merritts, MD;  Location: [place] CV LAB;  Service: Cardiovascular;  Laterality: N/A;  . Cardiac catheterization N/A 11/12/2014    Procedure: Coronary Stent Intervention;  Surgeon: Isaias Cowman, MD;  Location: [place] CV LAB;  Service: Cardiovascular;  Laterality: N/A;  . Trigger finger release    . Melanoma excision  2016    Dr. Evorn Gong    Current Outpatient Prescriptions  Medication Sig Dispense Refill  . ARIPiprazole (ABILIFY) 5 MG tablet Take 1 tablet (5 mg total) by mouth daily. 30 tablet 3  . aspirin EC 81 MG tablet Take 1 tablet (81 mg total) by mouth daily.    . baclofen (LIORESAL) 10 MG tablet Take 1 tablet (10 mg total) by mouth 2 (two) times daily as needed for muscle spasms.  0  . canagliflozin (INVOKANA) 100 MG TABS tablet Take 1 tablet (100 mg total) by mouth daily before breakfast. 30 tablet 5  . carvedilol (COREG) 25 MG tablet Take 1 tablet (25 mg total) by mouth 2 (two) times daily. 60 tablet 6  . citalopram (CELEXA) 40 MG tablet Take 1 tablet (40 mg total) by mouth daily. 30 tablet 3  . clopidogrel (PLAVIX) 75 MG tablet Take 1 tablet (75 mg total) by mouth daily. 30 tablet 5  . cyanocobalamin (,VITAMIN B-12,) 1000 MCG/ML injection Inject 1 mL (1,000 mcg total) into the  skin once a week. 1 mL 4  . diltiazem (CARDIZEM CD) 120 MG 24 hr capsule Take 1 capsule (120 mg total) by mouth daily. 30 capsule 11  . eszopiclone (LUNESTA) 2 MG TABS tablet Take 1 tablet (2 mg total) by mouth at bedtime as needed for sleep. Take immediately before bedtime 30 tablet 1  . fluconazole (DIFLUCAN) 150 MG tablet Take 1 tablet (150 mg total) by mouth once. (Patient taking differently: Take 150 mg by mouth as needed. ) 1 tablet 3  . isosorbide mononitrate (IMDUR) 30 MG 24 hr tablet Take 1 tablet (30 mg total) by mouth daily. 30 tablet 5  . levothyroxine (SYNTHROID, LEVOTHROID) 25 MCG tablet Take 25 mcg by mouth daily before breakfast.    . losartan (COZAAR) 25 MG tablet Take 25 mg by mouth daily.     . pantoprazole (PROTONIX) 40 MG tablet Take 40 mg by mouth 2 (two)  times daily.     . pregabalin (LYRICA) 100 MG capsule Take 1 capsule (100 mg total) by mouth 2 (two) times daily. 60 capsule 5  . promethazine (PHENERGAN) 25 MG tablet Take 25 mg by mouth as needed.     . rosuvastatin (CRESTOR) 20 MG tablet Take 1 tablet (20 mg total) by mouth daily at 6 PM. 30 tablet 0  . traZODone (DESYREL) 100 MG tablet Take 200 mg by mouth at bedtime.    . Vitamin D, Ergocalciferol, (DRISDOL) 50000 units CAPS capsule Take 50,000 Units by mouth every 7 (seven) days.     . metFORMIN (GLUCOPHAGE) 1000 MG tablet Take 1 tablet (1,000 mg total) by mouth daily. 1 tablet 1   No current facility-administered medications for this visit.    Allergies:   Tramadol   Social History:  The patient  reports that she quit smoking about 20 years ago. Her smoking use included Cigarettes. She has never used smokeless tobacco. She reports that she does not drink alcohol or use illicit drugs.   Family History:  The patient's family history includes Anxiety disorder in her father and mother; Bipolar disorder in her mother; Colon cancer in her father; Depression in her father, mother, and sister; Diabetes in her father and  sister; Heart disease in her father and mother; Hyperlipidemia in her mother and sister; Hypertension in her father, mother, sister, and sister; Kidney disease in her father and sister; Skin cancer in her father; Stroke in her father; Thyroid nodules in her sister.  ROS:   Review of Systems  Constitutional: Positive for malaise/fatigue. Negative for fever, chills, weight loss and diaphoresis.  HENT: Negative for congestion.   Eyes: Negative for discharge and redness.  Respiratory: Negative for cough, hemoptysis, sputum production, shortness of breath and wheezing.   Cardiovascular: Positive for palpitations. Negative for chest pain, orthopnea, claudication, leg swelling and PND.  Gastrointestinal: Negative for heartburn, nausea, vomiting and abdominal pain.  Musculoskeletal: Positive for falls. Negative for myalgias.  Skin: Negative for rash.  Neurological: Positive for dizziness, loss of consciousness and weakness. Negative for tingling, tremors, sensory change, speech change, focal weakness and seizures.  Endo/Heme/Allergies: Does not bruise/bleed easily.  Psychiatric/Behavioral: Positive for depression. Negative for suicidal ideas, hallucinations, memory loss and substance abuse. The patient is nervous/anxious. The patient does not have insomnia.   All other systems reviewed and are negative.     PHYSICAL EXAM:  VS:  BP 138/94 mmHg  Pulse 65  Ht 5\' 3"  (1.6 m)  Wt 185 lb 8 oz (84.142 kg)  BMI 32.87 kg/m2  LMP  (Approximate) BMI: Body mass index is 32.87 kg/(m^2). Well nourished, well developed, in no acute distress HEENT: normocephalic, atraumatic Neck: no JVD, carotid bruits or masses Cardiac:  normal S1, S2; RRR; no murmurs, rubs, or gallops Lungs:  clear to auscultation bilaterally, no wheezing, rhonchi or rales Abd: obese, soft, nontender, no hepatomegaly, + BS MS: no deformity or atrophy Ext: no edema Skin: warm and dry, no rash Neuro:  moves all extremities spontaneously,  no focal abnormalities noted, follows commands Psych: euthymic mood, full affect   EKG:  Was ordered today. Shows NSR, 65 bpm, nonspecific inferior st/t changes   Orthostatic Vital Signs: Lying: 144/86, 64 bpm Sitting:127/81, 73 bpm Standing: 128/82, 79 bpm Standing x 3 min: 127/83, 87 bpm  Recent Labs: 02/15/2015: ALT 14 03/14/2015: BUN 12; Creatinine, Ser 1.39*; Hemoglobin 10.4*; Magnesium 1.8; Platelets 171; Potassium 4.1; Sodium 138; TSH 2.180  11/09/2014: Cholesterol 164; HDL  27*; LDL Cholesterol 85; Total CHOL/HDL Ratio 6.1; Triglycerides 259*; VLDL 52*   Estimated Creatinine Clearance: 52 mL/min (by C-G formula based on Cr of 1.39).   Wt Readings from Last 3 Encounters:  03/14/15 185 lb 8 oz (84.142 kg)  03/11/15 185 lb 6.4 oz (84.097 kg)  02/22/15 183 lb (83.008 kg)     Other studies reviewed: Additional studies/records reviewed today include: summarized above  ASSESSMENT AND PLAN:  1. Syncope with history of orthostatic hypotension: -She is nearly orthostatic today -Prior 30 day event monitor has been unrevealing. Most recent 48 hour Holter was unrevealing -Repeat 30 day event monitor given her above history -Check carotid doppler -Check echo -Increase fluids -Check labs  2. CAD s/p PCI as above: -No chest pain symptoms -Continue current medications  3. Chronic systolic CHF: -She does not appear volume overloaded at this time -Continue current medications  4. CKD stage III: -Followed by Renal  5. History of prior strokes: -No symptoms of stroke at this time -As above  6. HTN: -Continue current medications  7. HLD: -Continue Crestor  Disposition: F/u with Dr. [name] in 6 weeks  Current medicines are reviewed at length with the patient today.  The patient did not have any concerns regarding medicines.  Melvern Banker PA-C 03/14/2015 1:02 PM     [address], [place] [address] [phone]

## 2015-03-14 NOTE — Patient Instructions (Addendum)
Medication Instructions:  Please continue your current medications  Labwork: BMET, CBC, TSH, Magnesium Please take [facility] to the [facility]  Testing/Procedures:  Your physician has recommended that you wear a 30 day event monitor. Event monitors are medical devices that record the heart's electrical activity. Doctors most often [place] these monitors to diagnose arrhythmias. Arrhythmias are problems with the speed or rhythm of the heartbeat. The monitor is a small, portable device. You can wear one while you do your normal daily activities. This is usually used to diagnose what is causing palpitations/syncope (passing out). [employer] will call you to verify your address before mailing monitor to your home.   Your physician has requested that you have an echocardiogram. Echocardiography is a painless test that uses sound waves to create images of your heart. It provides your doctor with information about the size and shape of your heart and how well your heart's chambers and valves are working. This procedure takes approximately one hour. There are no restrictions for this procedure.  Date & time: _________________________________  Your physician has requested that you have a carotid ultrasound. This test is an ultrasound of the carotid arteries in your neck. It looks at blood flow through these arteries that supply the brain with blood. Allow one hour for this exam. There are no restrictions or special instructions.   Date & time: ___________________________________  Follow-Up: 6 weeks  If you need a refill on your cardiac medications before your next appointment, please call your pharmacy.  Cardiac Event Monitoring A cardiac event monitor is a small recording device used to help detect abnormal heart rhythms (arrhythmias). The monitor is used to record heart rhythm when noticeable symptoms such as the following occur:  Fast heartbeats (palpitations), such as heart racing or  fluttering.  Dizziness.  Fainting or light-headedness.  Unexplained weakness. The monitor is wired to two electrodes placed on your chest. Electrodes are flat, sticky disks that attach to your skin. The monitor can be worn for up to 30 days. You will wear the monitor at all times, except when bathing.  HOW TO USE YOUR CARDIAC EVENT MONITOR A technician will prepare your chest for the electrode placement. The technician will show you how to place the electrodes, how to work the monitor, and how to replace the batteries. Take time to practice using the monitor before you leave the office. Make sure you understand how to send the information from the monitor to your health care provider. This requires a telephone with a landline, not a cell phone. You need to:  Wear your monitor at all times, except when you are in water:  Do not get the monitor wet.  Take the monitor off when bathing. Do not swim or use a hot tub with it on.  Keep your skin clean. Do not put body lotion or moisturizer on your chest.  Change the electrodes daily or any time they stop sticking to your skin. You might need to use tape to keep them on.  It is possible that your skin under the electrodes could become irritated. To keep this from happening, try to put the electrodes in slightly different places on your chest. However, they must remain in the area under your left breast and in the upper right section of your chest.  Make sure the monitor is safely clipped to your clothing or in a location close to your body that your health care provider recommends.  Press the button to record when you feel symptoms  of heart trouble, such as dizziness, weakness, light-headedness, palpitations, thumping, shortness of breath, unexplained weakness, or a fluttering or racing heart. The monitor is always on and records what happened slightly before you pressed the button, so do not worry about being too late to get good  information.  Keep a diary of your activities, such as walking, doing chores, and taking medicine. It is especially important to note what you were doing when you pushed the button to record your symptoms. This will help your health care provider determine what might be contributing to your symptoms. The information stored in your monitor will be reviewed by your health care provider alongside your diary entries.  Send the recorded information as recommended by your health care provider. It is important to understand that it will take some time for your health care provider to process the results.  Change the batteries as recommended by your health care provider. SEEK IMMEDIATE MEDICAL CARE IF:   You have chest pain.  You have extreme difficulty breathing or shortness of breath.  You develop a very fast heartbeat that persists.  You develop dizziness that does not go away.  You faint or constantly feel you are about to faint.   This information is not intended to replace advice given to you by your health care provider. Make sure you discuss any questions you have with your health care provider.   Document Released: 10/29/2007 Document Revised: 02/09/2014 Document Reviewed: 07/18/2012 Elsevier Interactive Patient Education [employer]. Echocardiogram An echocardiogram, or echocardiography, uses sound waves (ultrasound) to produce an image of your heart. The echocardiogram is simple, painless, obtained within a short period of time, and offers valuable information to your health care provider. The images from an echocardiogram can provide information such as:  Evidence of coronary artery disease (CAD).  Heart size.  Heart muscle function.  Heart valve function.  Aneurysm detection.  Evidence of a past heart attack.  Fluid buildup around the heart.  Heart muscle thickening.  Assess heart valve function. LET [facility] CARE PROVIDER KNOW ABOUT:  Any allergies you  have.  All medicines you are taking, including vitamins, herbs, eye drops, creams, and over-the-counter medicines.  Previous problems you or members of your family have had with the use of anesthetics.  Any blood disorders you have.  Previous surgeries you have had.  Medical conditions you have.  Possibility of pregnancy, if this applies. BEFORE THE PROCEDURE  No special preparation is needed. Eat and drink normally.  PROCEDURE   In order to produce an image of your heart, gel will be applied to your chest and a wand-like tool (transducer) will be moved over your chest. The gel will help transmit the sound waves from the transducer. The sound waves will harmlessly bounce off your heart to allow the heart images to be captured in real-time motion. These images will then be recorded.  You may need an IV to receive a medicine that improves the quality of the pictures. AFTER THE PROCEDURE You may return to your normal schedule including diet, activities, and medicines, unless your health care provider tells you otherwise.   This information is not intended to replace advice given to you by your health care provider. Make sure you discuss any questions you have with your health care provider.   Document Released: 01/17/2000 Document Revised: 02/09/2014 Document Reviewed: 09/26/2012 Elsevier Interactive Patient Education [employer].

## 2015-03-15 ENCOUNTER — Telehealth: Payer: Self-pay | Admitting: Psychiatry

## 2015-03-15 NOTE — Telephone Encounter (Signed)
Spoke with patient today and informed her that her B12 levels were high at greater than 2000. I've also send a message to the patient's primary care physician. Patient stated she is going to the [facility] to get iron infusions and would ask the physician there about the B12. I also instructed her to follow up with her primary care physician.

## 2015-03-18 ENCOUNTER — Encounter (INDEPENDENT_AMBULATORY_CARE_PROVIDER_SITE_OTHER): Payer: BC Managed Care – PPO

## 2015-03-18 DIAGNOSIS — R55 Syncope and collapse: Secondary | ICD-10-CM

## 2015-03-18 DIAGNOSIS — I25111 Atherosclerotic heart disease of native coronary artery with angina pectoris with documented spasm: Secondary | ICD-10-CM | POA: Diagnosis not present

## 2015-03-18 DIAGNOSIS — I255 Ischemic cardiomyopathy: Secondary | ICD-10-CM | POA: Diagnosis not present

## 2015-03-20 ENCOUNTER — Telehealth: Payer: Self-pay

## 2015-03-20 NOTE — Telephone Encounter (Signed)
went online and did the prior auth threw [email or web address]- pending review.

## 2015-03-20 NOTE — Telephone Encounter (Signed)
recieved notice that a prior authorization was needed for eszopiclone 2mg 

## 2015-03-20 NOTE — Telephone Encounter (Signed)
received fax from [facility] that prior auth was approved from 03-14-15-- 03-13-2018

## 2015-03-22 ENCOUNTER — Inpatient Hospital Stay: Payer: BC Managed Care – PPO | Attending: Oncology | Admitting: Oncology

## 2015-03-22 VITALS — BP 147/83 | HR 70 | Temp 97.3°F | Resp 20 | Wt 185.8 lb

## 2015-03-22 DIAGNOSIS — Z7984 Long term (current) use of oral hypoglycemic drugs: Secondary | ICD-10-CM

## 2015-03-22 DIAGNOSIS — Z800 Family history of malignant neoplasm of digestive organs: Secondary | ICD-10-CM | POA: Diagnosis not present

## 2015-03-22 DIAGNOSIS — F329 Major depressive disorder, single episode, unspecified: Secondary | ICD-10-CM

## 2015-03-22 DIAGNOSIS — I255 Ischemic cardiomyopathy: Secondary | ICD-10-CM | POA: Diagnosis not present

## 2015-03-22 DIAGNOSIS — E039 Hypothyroidism, unspecified: Secondary | ICD-10-CM

## 2015-03-22 DIAGNOSIS — Z8669 Personal history of other diseases of the nervous system and sense organs: Secondary | ICD-10-CM

## 2015-03-22 DIAGNOSIS — I5022 Chronic systolic (congestive) heart failure: Secondary | ICD-10-CM | POA: Diagnosis not present

## 2015-03-22 DIAGNOSIS — Z8673 Personal history of transient ischemic attack (TIA), and cerebral infarction without residual deficits: Secondary | ICD-10-CM

## 2015-03-22 DIAGNOSIS — Z87891 Personal history of nicotine dependence: Secondary | ICD-10-CM

## 2015-03-22 DIAGNOSIS — E119 Type 2 diabetes mellitus without complications: Secondary | ICD-10-CM

## 2015-03-22 DIAGNOSIS — E785 Hyperlipidemia, unspecified: Secondary | ICD-10-CM | POA: Diagnosis not present

## 2015-03-22 DIAGNOSIS — K219 Gastro-esophageal reflux disease without esophagitis: Secondary | ICD-10-CM | POA: Diagnosis not present

## 2015-03-22 DIAGNOSIS — I129 Hypertensive chronic kidney disease with stage 1 through stage 4 chronic kidney disease, or unspecified chronic kidney disease: Secondary | ICD-10-CM

## 2015-03-22 DIAGNOSIS — F419 Anxiety disorder, unspecified: Secondary | ICD-10-CM

## 2015-03-22 DIAGNOSIS — Z8719 Personal history of other diseases of the digestive system: Secondary | ICD-10-CM

## 2015-03-22 DIAGNOSIS — I951 Orthostatic hypotension: Secondary | ICD-10-CM

## 2015-03-22 DIAGNOSIS — N184 Chronic kidney disease, stage 4 (severe): Secondary | ICD-10-CM

## 2015-03-22 DIAGNOSIS — Z7982 Long term (current) use of aspirin: Secondary | ICD-10-CM

## 2015-03-22 DIAGNOSIS — D509 Iron deficiency anemia, unspecified: Secondary | ICD-10-CM

## 2015-03-22 DIAGNOSIS — Z79899 Other long term (current) drug therapy: Secondary | ICD-10-CM

## 2015-03-22 DIAGNOSIS — N289 Disorder of kidney and ureter, unspecified: Secondary | ICD-10-CM | POA: Diagnosis not present

## 2015-03-22 DIAGNOSIS — I2510 Atherosclerotic heart disease of native coronary artery without angina pectoris: Secondary | ICD-10-CM | POA: Diagnosis not present

## 2015-03-22 DIAGNOSIS — G35 Multiple sclerosis: Secondary | ICD-10-CM

## 2015-03-22 DIAGNOSIS — I252 Old myocardial infarction: Secondary | ICD-10-CM

## 2015-03-22 DIAGNOSIS — Z85820 Personal history of malignant melanoma of skin: Secondary | ICD-10-CM

## 2015-03-22 DIAGNOSIS — Z86010 Personal history of colonic polyps: Secondary | ICD-10-CM | POA: Diagnosis not present

## 2015-03-22 NOTE — Progress Notes (Signed)
Patient here today for initial evaluation regarding anemia. Patient reports she has been taking oral iron. Patient denies pain, reports "passing out spells" and is currently wearing holter monitor and being evaluated by Dr. [name].

## 2015-03-26 ENCOUNTER — Ambulatory Visit (INDEPENDENT_AMBULATORY_CARE_PROVIDER_SITE_OTHER): Payer: BC Managed Care – PPO

## 2015-03-26 ENCOUNTER — Ambulatory Visit: Payer: BC Managed Care – PPO

## 2015-03-26 ENCOUNTER — Other Ambulatory Visit: Payer: Self-pay

## 2015-03-26 DIAGNOSIS — I255 Ischemic cardiomyopathy: Secondary | ICD-10-CM

## 2015-03-26 DIAGNOSIS — I25111 Atherosclerotic heart disease of native coronary artery with angina pectoris with documented spasm: Secondary | ICD-10-CM | POA: Diagnosis not present

## 2015-03-26 DIAGNOSIS — R55 Syncope and collapse: Secondary | ICD-10-CM

## 2015-03-28 ENCOUNTER — Ambulatory Visit: Payer: BC Managed Care – PPO

## 2015-03-28 ENCOUNTER — Inpatient Hospital Stay: Payer: BC Managed Care – PPO

## 2015-03-28 VITALS — BP 190/84 | HR 58 | Temp 98.2°F | Resp 20

## 2015-03-28 DIAGNOSIS — D509 Iron deficiency anemia, unspecified: Secondary | ICD-10-CM

## 2015-03-28 MED ORDER — SODIUM CHLORIDE 0.9 % IV SOLN
Freq: Once | INTRAVENOUS | Status: AC
  Administered 2015-03-28: 10:00:00 via INTRAVENOUS
  Filled 2015-03-28: qty 1000

## 2015-03-28 MED ORDER — SODIUM CHLORIDE 0.9 % IV SOLN
510.0000 mg | Freq: Once | INTRAVENOUS | Status: AC
  Administered 2015-03-28: 510 mg via INTRAVENOUS
  Filled 2015-03-28: qty 17

## 2015-03-28 NOTE — Patient Instructions (Signed)

## 2015-03-29 ENCOUNTER — Ambulatory Visit: Payer: Self-pay

## 2015-03-29 NOTE — Progress Notes (Signed)
[facility]  Telephone:([phone]) [phone] Fax:(336) [phone]  ID: [name] OB: 29-Aug-1970  MR#: [id number]  [id number]  Patient Care Team: [name], MD as PCP - General (Family Medicine) Dagoberto Ligas, MD as Consulting Physician (Nephrology) Minna Merritts, MD as Consulting Physician (Cardiology) Vladimir Crofts, MD as Consulting Physician (Neurology) Irene Shipper, MD as Consulting Physician (Gastroenterology)  CHIEF COMPLAINT:  Chief Complaint  Patient presents with  . New Evaluation    Anemia    INTERVAL HISTORY: Patient is a 45 year old female with mild renal insufficiency who was found to have decreased iron stores on routine blood work. Patient reports she recently had some "passing out spells" and is currently wearing a Holter monitor prescribed by cardiology. She otherwise feels well. She has no other neurologic complaints. She denies any recent fevers or illnesses. She has a good appetite and denies weight loss. She denies any chest pain or shortness of breath. She denies any nausea, vomiting, constipation, or diarrhea. She has no urinary complaints. Patient otherwise feels well and offers no further specific complaints.  REVIEW OF SYSTEMS:   Review of Systems  Constitutional: Negative.  Negative for fever and weight loss.  Respiratory: Negative.  Negative for cough and shortness of breath.   Cardiovascular: Negative.  Negative for chest pain and palpitations.  Gastrointestinal: Negative.  Negative for blood in stool and melena.  Musculoskeletal: Negative.   Neurological: Positive for loss of consciousness. Negative for weakness.    As per HPI. Otherwise, a complete review of systems is negatve.  PAST MEDICAL HISTORY: Past Medical History  Diagnosis Date  . Colon polyp   . GERD (gastroesophageal reflux disease)   . CAD (coronary artery disease)     a. cath 08/09/2003: mild LAD plaque, no obs dzs noted; b. 12/18/10 stress test w/o  ischemia; c. cath 11/2014: LM: nl, mid 85-90% s/p PCI/DES, mid LCX 30%, RCA nl  . CVA (cerebral vascular accident) ([place])     a. 06/2008-TEE nl LV fxn; b. 01/2013 - notes indicate short run of SVT at that time  . Multiple sclerosis ([place]) 2015  . Syncope   . Chronic systolic CHF (congestive heart failure) ([place])     a. echo 01/29/2013 EF 40-45%, basal anteroseptal & mid anteroseptal segments abnl; b echo 05/2012 EF 50-55%, mildly dilated LA, nl RVSP  . Orthostatic hypotension   . Aortic arch aneurysm ([place])   . Ischemic cardiomyopathy   . MS (mitral stenosis)   . Malignant melanoma of skin of scalp ([place])   . Hypertension   . Depression   . Diabetes mellitus without complication ([name])   . Headache     Migrains  . Hyperlipidemia   . CKD (chronic kidney disease), stage IV ([place])   . Hypothyroidism   . Anxiety   . Gastric ulcer 04/27/2011  . Heart attack ([place])     PAST SURGICAL HISTORY: Past Surgical History  Procedure Laterality Date  . Appendectomy    . Right oophorectomy    . Tubal ligation      R tube removed still w/ Left  . Dilation and curettage of uterus    . Gastric bypass  09/2009    [facility]   . Esophagogastroduodenoscopy (egd) with propofol N/A 09/14/2014    Procedure: ESOPHAGOGASTRODUODENOSCOPY (EGD) WITH PROPOFOL;  Surgeon: Josefine Class, MD;  Location: [facility] ENDOSCOPY;  Service: Endoscopy;  Laterality: N/A;  . Cardiac catheterization N/A 11/09/2014    Procedure: Coronary Angiography;  Surgeon: Minna Merritts, MD;  Location: [facility] CV LAB;  Service: Cardiovascular;  Laterality: N/A;  . Cardiac catheterization N/A 11/12/2014    Procedure: Coronary Stent Intervention;  Surgeon: Isaias Cowman, MD;  Location: [facility];  Service: Cardiovascular;  Laterality: N/A;  . Trigger finger release    . Melanoma excision  2016    Dr. Evorn Gong    FAMILY HISTORY Family History  Problem Relation Age of Onset  . Hypertension Mother   .  Anxiety disorder Mother   . Depression Mother   . Bipolar disorder Mother   . Heart disease Mother   . Hyperlipidemia Mother   . Kidney disease Father   . Heart disease Father   . Hypertension Father   . Diabetes Father   . Stroke Father   . Colon cancer Father     dx in his 31's  . Anxiety disorder Father   . Depression Father   . Skin cancer Father   . Kidney disease Sister   . Thyroid nodules Sister   . Hypertension Sister   . Hypertension Sister   . Diabetes Sister   . Hyperlipidemia Sister   . Depression Sister        ADVANCED DIRECTIVES:    HEALTH MAINTENANCE: Social History  Substance Use Topics  . Smoking status: Former Smoker    Types: Cigarettes    Quit date: 08/31/1994  . Smokeless tobacco: Never Used     Comment: quit 28 years ago  . Alcohol Use: No     Colonoscopy:  PAP:  Bone density:  Lipid panel:  Allergies  Allergen Reactions  . Tramadol Other (See Comments)    Reaction:  Burning of pts mouth and confusion     Current Outpatient Prescriptions  Medication Sig Dispense Refill  . aspirin EC 81 MG tablet Take 1 tablet (81 mg total) by mouth daily.    . baclofen (LIORESAL) 10 MG tablet Take 1 tablet (10 mg total) by mouth 2 (two) times daily as needed for muscle spasms.  0  . canagliflozin (INVOKANA) 100 MG TABS tablet Take 1 tablet (100 mg total) by mouth daily before breakfast. 30 tablet 5  . carvedilol (COREG) 25 MG tablet Take 1 tablet (25 mg total) by mouth 2 (two) times daily. 60 tablet 6  . citalopram (CELEXA) 40 MG tablet Take 1 tablet (40 mg total) by mouth daily. 30 tablet 3  . clopidogrel (PLAVIX) 75 MG tablet Take 1 tablet (75 mg total) by mouth daily. 30 tablet 5  . diltiazem (CARDIZEM CD) 120 MG 24 hr capsule Take 1 capsule (120 mg total) by mouth daily. 30 capsule 11  . eszopiclone (LUNESTA) 2 MG TABS tablet Take 1 tablet (2 mg total) by mouth at bedtime as needed for sleep. Take immediately before bedtime 30 tablet 1  .  isosorbide mononitrate (IMDUR) 30 MG 24 hr tablet Take 1 tablet (30 mg total) by mouth daily. 30 tablet 5  . levothyroxine (SYNTHROID, LEVOTHROID) 25 MCG tablet Take 25 mcg by mouth daily before breakfast.    . losartan (COZAAR) 25 MG tablet Take 25 mg by mouth daily.     . pantoprazole (PROTONIX) 40 MG tablet Take 40 mg by mouth 2 (two) times daily.     . pregabalin (LYRICA) 100 MG capsule Take 1 capsule (100 mg total) by mouth 2 (two) times daily. 60 capsule 5  . promethazine (PHENERGAN) 25 MG tablet Take 25 mg by mouth as needed.     Marland Kitchen  rosuvastatin (CRESTOR) 20 MG tablet Take 1 tablet (20 mg total) by mouth daily at 6 PM. 30 tablet 0  . traZODone (DESYREL) 100 MG tablet Take 200 mg by mouth at bedtime.    . Vitamin D, Ergocalciferol, (DRISDOL) 50000 units CAPS capsule Take 50,000 Units by mouth every 7 (seven) days.     . metFORMIN (GLUCOPHAGE) 1000 MG tablet Take 1 tablet (1,000 mg total) by mouth daily. 1 tablet 1   No current facility-administered medications for this visit.    OBJECTIVE: Filed Vitals:   03/22/15 1131  BP: 147/83  Pulse: 70  Temp: 97.3 F (36.3 C)  Resp: 20     Body mass index is 32.93 kg/(m^2).    ECOG FS:0 - Asymptomatic  General: Well-developed, well-nourished, no acute distress. Eyes: Pink conjunctiva, anicteric sclera. HEENT: Normocephalic, moist mucous membranes, clear oropharnyx. Lungs: Clear to auscultation bilaterally. Heart: Regular rate and rhythm. No rubs, murmurs, or gallops. Abdomen: Soft, nontender, nondistended. No organomegaly noted, normoactive bowel sounds. Musculoskeletal: No edema, cyanosis, or clubbing. Neuro: Alert, answering all questions appropriately. Cranial nerves grossly intact. Skin: No rashes or petechiae noted. Psych: Normal affect. Lymphatics: No cervical, calvicular, axillary or inguinal LAD.   LAB RESULTS:  Lab Results  Component Value Date   NA 138 03/14/2015   K 4.1 03/14/2015   CL 104 03/14/2015   CO2 25  03/14/2015   GLUCOSE 104* 03/14/2015   BUN 12 03/14/2015   CREATININE 1.39* 03/14/2015   CALCIUM 8.9 03/14/2015   PROT 7.3 02/15/2015   ALBUMIN 3.9 02/15/2015   AST 16 02/15/2015   ALT 14 02/15/2015   ALKPHOS 81 02/15/2015   BILITOT 0.5 02/15/2015   GFRNONAA 45* 03/14/2015   GFRAA 52* 03/14/2015    Lab Results  Component Value Date   WBC 9.0 03/14/2015   NEUTROABS 6.4 03/14/2015   HGB 10.4* 03/14/2015   HCT 32.9* 03/14/2015   MCV 68.9* 03/14/2015   PLT 171 03/14/2015     STUDIES: No results found.  ASSESSMENT: Iron deficiency anemia.  PLAN:    1. Iron deficiency anemia: Possibly secondary to poor absorption given patient's history of gastric bypass surgery. Recent laboratory work revealed a hemoglobin of 10.5, MCV 72, iron saturation 7%, total iron 23, ferritin 8. Patient will definitely benefit from IV iron. Return to clinic in 1 and 2 weeks for consideration of 510 mg IV iron. Patient will then return to clinic in 3 months with repeat laboratory work and further evaluation. Will also do a full anemia workup at that time. 2. Mild renal insufficiency: Treatment per nephrology. 3. Syncopal cells: Patient is currently being evaluated by cardiology.  Patient expressed understanding and was in agreement with this plan. She also understands that She can call clinic at any time with any questions, concerns, or complaints.   Lloyd Huger, MD   03/29/2015 3:13 PM

## 2015-04-03 ENCOUNTER — Encounter: Payer: Self-pay | Admitting: Cardiovascular Disease

## 2015-04-04 ENCOUNTER — Inpatient Hospital Stay: Payer: BC Managed Care – PPO | Attending: Oncology

## 2015-04-04 ENCOUNTER — Encounter: Payer: Self-pay | Admitting: Cardiovascular Disease

## 2015-04-04 VITALS — BP 170/90 | HR 64 | Temp 97.8°F | Resp 20

## 2015-04-04 DIAGNOSIS — D509 Iron deficiency anemia, unspecified: Secondary | ICD-10-CM | POA: Insufficient documentation

## 2015-04-04 DIAGNOSIS — Z79899 Other long term (current) drug therapy: Secondary | ICD-10-CM | POA: Insufficient documentation

## 2015-04-04 MED ORDER — SODIUM CHLORIDE 0.9 % IV SOLN
510.0000 mg | Freq: Once | INTRAVENOUS | Status: AC
  Administered 2015-04-04: 510 mg via INTRAVENOUS
  Filled 2015-04-04: qty 17

## 2015-04-04 MED ORDER — SODIUM CHLORIDE 0.9 % IV SOLN
Freq: Once | INTRAVENOUS | Status: AC
  Administered 2015-04-04: 11:00:00 via INTRAVENOUS
  Filled 2015-04-04: qty 1000

## 2015-04-05 ENCOUNTER — Ambulatory Visit (INDEPENDENT_AMBULATORY_CARE_PROVIDER_SITE_OTHER): Payer: BC Managed Care – PPO | Admitting: Family Medicine

## 2015-04-05 ENCOUNTER — Encounter: Payer: Self-pay | Admitting: Family Medicine

## 2015-04-05 VITALS — BP 138/80 | HR 81 | Temp 98.2°F | Resp 18

## 2015-04-05 DIAGNOSIS — J0100 Acute maxillary sinusitis, unspecified: Secondary | ICD-10-CM | POA: Diagnosis not present

## 2015-04-05 DIAGNOSIS — N3000 Acute cystitis without hematuria: Secondary | ICD-10-CM

## 2015-04-05 DIAGNOSIS — E119 Type 2 diabetes mellitus without complications: Secondary | ICD-10-CM | POA: Diagnosis not present

## 2015-04-05 DIAGNOSIS — N390 Urinary tract infection, site not specified: Secondary | ICD-10-CM | POA: Insufficient documentation

## 2015-04-05 DIAGNOSIS — J309 Allergic rhinitis, unspecified: Secondary | ICD-10-CM | POA: Diagnosis not present

## 2015-04-05 LAB — POCT URINALYSIS DIPSTICK
Bilirubin, UA: NEGATIVE
GLUCOSE UA: 2000
Ketones, UA: NEGATIVE
NITRITE UA: NEGATIVE
SPEC GRAV UA: 1.01
UROBILINOGEN UA: 0.2
pH, UA: 5

## 2015-04-05 LAB — POCT UA - MICROALBUMIN: Microalbumin Ur, POC: 50 mg/L

## 2015-04-05 LAB — POCT GLYCOSYLATED HEMOGLOBIN (HGB A1C)
Est. average glucose Bld gHb Est-mCnc: 120
Hemoglobin A1C: 5.8

## 2015-04-05 MED ORDER — CEFDINIR 300 MG PO CAPS: 600.0000 mg | ORAL_CAPSULE | Freq: Every day | ORAL | Status: AC

## 2015-04-05 NOTE — Progress Notes (Addendum)
Patient: [name] Female    DOB: 30-Jan-1971   45 y.o.   MRN: [id number] Visit Date: 04/05/2015  Today's Provider: [name], MD   Chief Complaint  Patient presents with  . Diabetes    6 week follow up  . URI    x 3 days  . Dysuria    x 2 weeks   Subjective:    HPI   Diabetes Mellitus Type II, Follow-up:   Lab Results  Component Value Date   HGBA1C 5.8 01/03/2015   HGBA1C 5.7 03/20/2014   HGBA1C 6.8* 12/30/2013   Lab Results  Component Value Date   CREATININE 1.39* 03/14/2015   BUN 12 03/14/2015   NA 138 03/14/2015   K 4.1 03/14/2015   CL 104 03/14/2015   CO2 25 03/14/2015   No results found for: GGT Last seen for diabetes 6 weeks ago.  Management since then includes decreasing Invokana to 138m before breakfast due to low eGFR and also started back on lower dose of Metformin 1,0072mdaily. . She reports good compliance with treatment. She is not having side effects.  Current symptoms include none and have been stable. Home blood sugar records: not being checked  Episodes of hypoglycemia? no   Current Insulin Regimen: none Most Recent Eye Exam: >1 year Weight trend: stable Prior visit with dietician: no Current diet: healthy Current exercise: cardiovascular workout on exercise equipment  Pertinent Labs:    Component Value Date/Time   CHOL 164 11/09/2014 0454   CHOL 197 12/30/2013 0355   TRIG 259* 11/09/2014 0454   TRIG 261* 12/30/2013 0355   HDL 27* 11/09/2014 0454   HDL 28* 12/30/2013 0355   LDLCALC 85 11/09/2014 0454   LDLCALC 117* 12/30/2013 0355   CREATININE 1.39* 03/14/2015 1052   CREATININE 1.75* 12/30/2013 0355   CREATININE 1.6* 02/03/2013    Wt Readings from Last 3 Encounters:  03/22/15 185 lb 13.6 oz (84.3 kg)  03/14/15 185 lb 8 oz (84.142 kg)  03/11/15 185 lb 6.4 oz (84.097 kg)    ------------------------------------------------------------------------ Dysuria:  Patient beleives she has a bladder infection.  Patient complains of a painful burning sensation during and after urination. She also has urinary urgency and frequency along with foul odor. Patient states symptoms started 2 weeks ago and are worsening.   Cold Symptoms:  Patient complains of itchy eyes, sneezing, watery eyes, runny nose, stuffy nose, ear clogged up and headache for the past 3 days. Patient denies fevers, chills or cough. Patient has tried Sudafed with mild relief.       Allergies  Allergen Reactions  . Tramadol Other (See Comments)    Reaction:  Burning of pts mouth and confusion    Previous Medications   ASPIRIN EC 81 MG TABLET    Take 1 tablet (81 mg total) by mouth daily.   BACLOFEN (LIORESAL) 10 MG TABLET    Take 1 tablet (10 mg total) by mouth 2 (two) times daily as needed for muscle spasms.   CANAGLIFLOZIN (INVOKANA) 100 MG TABS TABLET    Take 1 tablet (100 mg total) by mouth daily before breakfast.   CARVEDILOL (COREG) 25 MG TABLET    Take 1 tablet (25 mg total) by mouth 2 (two) times daily.   CITALOPRAM (CELEXA) 40 MG TABLET    Take 1 tablet (40 mg total) by mouth daily.   CLOPIDOGREL (PLAVIX) 75 MG TABLET    Take 1 tablet (75 mg total) by mouth  daily.   DILTIAZEM (CARDIZEM CD) 120 MG 24 HR CAPSULE    Take 1 capsule (120 mg total) by mouth daily.   ESZOPICLONE (LUNESTA) 2 MG TABS TABLET    Take 1 tablet (2 mg total) by mouth at bedtime as needed for sleep. Take immediately before bedtime   ISOSORBIDE MONONITRATE (IMDUR) 30 MG 24 HR TABLET    Take 1 tablet (30 mg total) by mouth daily.   LEVOTHYROXINE (SYNTHROID, LEVOTHROID) 25 MCG TABLET    Take 25 mcg by mouth daily before breakfast.   LOSARTAN (COZAAR) 25 MG TABLET    Take 25 mg by mouth daily.    METFORMIN (GLUCOPHAGE) 1000 MG TABLET    Take 1 tablet (1,000 mg total) by mouth daily.   PANTOPRAZOLE (PROTONIX) 40 MG TABLET    Take 40 mg by mouth 2 (two) times daily.    PREGABALIN (LYRICA) 100 MG CAPSULE    Take 1 capsule (100 mg total) by mouth 2 (two) times  daily.   PROMETHAZINE (PHENERGAN) 25 MG TABLET    Take 25 mg by mouth as needed.    ROSUVASTATIN (CRESTOR) 20 MG TABLET    Take 1 tablet (20 mg total) by mouth daily at 6 PM.   TRAZODONE (DESYREL) 100 MG TABLET    Take 200 mg by mouth at bedtime.   VITAMIN D, ERGOCALCIFEROL, (DRISDOL) 50000 UNITS CAPS CAPSULE    Take 50,000 Units by mouth every 7 (seven) days.     Review of Systems  Constitutional: Positive for fatigue. Negative for fever, chills and appetite change.  HENT: Positive for congestion, postnasal drip, rhinorrhea, sinus pressure and sneezing. Negative for nosebleeds and sore throat.   Eyes: Positive for discharge and itching.  Respiratory: Negative for cough, chest tightness and shortness of breath.   Cardiovascular: Negative for chest pain and palpitations.  Gastrointestinal: Negative for nausea, vomiting and abdominal pain.  Endocrine: Positive for polyuria. Negative for polydipsia and polyphagia.  Genitourinary: Positive for dysuria, urgency and frequency.  Neurological: Negative for dizziness and weakness.    Social History  Substance Use Topics  . Smoking status: Former Smoker    Types: Cigarettes    Quit date: 08/31/1994  . Smokeless tobacco: Never Used     Comment: quit 28 years ago  . Alcohol Use: No   Objective:   BP 134/82 mmHg  Pulse 81  Temp(Src) 98.2 F (36.8 C) (Oral)  Resp 18  SpO2 97%  LMP  (Approximate)  Repeat BP=138/80 Physical Exam  General Appearance:    Alert, cooperative, no distress  HENT:   bilateral TM normal without fluid or infection, neck without nodes, throat normal without erythema or exudate, frontal sinus tender and nasal mucosa pale and congested  Eyes:    PERRL, conjunctiva/corneas clear, EOM's intact       Lungs:     Clear to auscultation bilaterally, respirations unlabored  Heart:    Regular rate and rhythm  Neurologic:   Awake, alert, oriented x 3. No apparent focal neurological           defect.        Results for  orders placed or performed in visit on 04/05/15  POCT HgB A1C  Result Value Ref Range   Hemoglobin A1C 5.8    Est. average glucose Bld gHb Est-mCnc 120   POCT urinalysis dipstick  Result Value Ref Range   Color, UA yellow    Clarity, UA cloudy    Glucose, UA 2000  Bilirubin, UA negative    Ketones, UA negative    Spec Grav, UA 1.010    Blood, UA Trace (hemolyzed)    pH, UA 5.0    Protein, UA Trace    Urobilinogen, UA 0.2    Nitrite, UA negative    Leukocytes, UA moderate (2+) (A) Negative  POCT UA - Microalbumin  Result Value Ref Range   Microalbumin Ur, POC 50 mg/L   Creatinine, POC n/a mg/dL   Albumin/Creatinine Ratio, Urine, POC n/a       Assessment & Plan:     1. Controlled type 2 diabetes mellitus without complication, without long-term current use of insulin ([place]) Well controlled since reducing Invokana 100. eGFR has stabilized in vicinity of 45. Continue current medications. Recheck in 4 months.  - POCT HgB A1C - POCT UA - Microalbumin  2. Acute cystitis without hematuria Cefdinir 64m daily for 10 days - POCT urinalysis dipstick - Urine Culture  3. Subacute maxillary sinusitis Cefdinir 6059mdaily for 10 days Call if symptoms change or if not rapidly improving.    4. Allergic rhinitis, unspecified allergic rhinitis type Patient Instructions  Try OTC Nasocort AQ once a day for allergies          DoLelon HuhMD  [facility]

## 2015-04-05 NOTE — Patient Instructions (Signed)
Try OTC Nasocort AQ once a day for allergies

## 2015-04-08 ENCOUNTER — Ambulatory Visit: Payer: No Typology Code available for payment source | Admitting: Psychiatry

## 2015-04-08 LAB — URINE CULTURE

## 2015-04-17 ENCOUNTER — Emergency Department: Payer: BC Managed Care – PPO

## 2015-04-17 ENCOUNTER — Observation Stay
Admission: EM | Admit: 2015-04-17 | Discharge: 2015-04-18 | Disposition: A | Discharge status: Home / Self Care | Payer: BC Managed Care – PPO | Attending: Internal Medicine | Admitting: Internal Medicine

## 2015-04-17 DIAGNOSIS — I208 Other forms of angina pectoris: Secondary | ICD-10-CM | POA: Diagnosis present

## 2015-04-17 DIAGNOSIS — I259 Chronic ischemic heart disease, unspecified: Secondary | ICD-10-CM | POA: Diagnosis not present

## 2015-04-17 DIAGNOSIS — Z8711 Personal history of peptic ulcer disease: Secondary | ICD-10-CM | POA: Diagnosis not present

## 2015-04-17 DIAGNOSIS — I130 Hypertensive heart and chronic kidney disease with heart failure and stage 1 through stage 4 chronic kidney disease, or unspecified chronic kidney disease: Secondary | ICD-10-CM | POA: Diagnosis not present

## 2015-04-17 DIAGNOSIS — Z9884 Bariatric surgery status: Secondary | ICD-10-CM | POA: Insufficient documentation

## 2015-04-17 DIAGNOSIS — E785 Hyperlipidemia, unspecified: Secondary | ICD-10-CM | POA: Diagnosis not present

## 2015-04-17 DIAGNOSIS — B9681 Helicobacter pylori [H. pylori] as the cause of diseases classified elsewhere: Secondary | ICD-10-CM | POA: Insufficient documentation

## 2015-04-17 DIAGNOSIS — I25110 Atherosclerotic heart disease of native coronary artery with unstable angina pectoris: Secondary | ICD-10-CM | POA: Diagnosis not present

## 2015-04-17 DIAGNOSIS — E039 Hypothyroidism, unspecified: Secondary | ICD-10-CM | POA: Diagnosis not present

## 2015-04-17 DIAGNOSIS — I252 Old myocardial infarction: Secondary | ICD-10-CM | POA: Diagnosis not present

## 2015-04-17 DIAGNOSIS — Z818 Family history of other mental and behavioral disorders: Secondary | ICD-10-CM | POA: Insufficient documentation

## 2015-04-17 DIAGNOSIS — I5022 Chronic systolic (congestive) heart failure: Secondary | ICD-10-CM | POA: Insufficient documentation

## 2015-04-17 DIAGNOSIS — K219 Gastro-esophageal reflux disease without esophagitis: Secondary | ICD-10-CM | POA: Insufficient documentation

## 2015-04-17 DIAGNOSIS — Z841 Family history of disorders of kidney and ureter: Secondary | ICD-10-CM | POA: Diagnosis not present

## 2015-04-17 DIAGNOSIS — Z91148 Patient's other noncompliance with medication regimen for other reason: Secondary | ICD-10-CM

## 2015-04-17 DIAGNOSIS — N183 Chronic kidney disease, stage 3 (moderate): Secondary | ICD-10-CM | POA: Diagnosis not present

## 2015-04-17 DIAGNOSIS — R339 Retention of urine, unspecified: Secondary | ICD-10-CM | POA: Insufficient documentation

## 2015-04-17 DIAGNOSIS — R61 Generalized hyperhidrosis: Secondary | ICD-10-CM | POA: Diagnosis not present

## 2015-04-17 DIAGNOSIS — I2089 Other forms of angina pectoris: Secondary | ICD-10-CM | POA: Diagnosis present

## 2015-04-17 DIAGNOSIS — Z9119 Patient's noncompliance with other medical treatment and regimen: Secondary | ICD-10-CM | POA: Insufficient documentation

## 2015-04-17 DIAGNOSIS — Z7982 Long term (current) use of aspirin: Secondary | ICD-10-CM | POA: Insufficient documentation

## 2015-04-17 DIAGNOSIS — R0602 Shortness of breath: Secondary | ICD-10-CM | POA: Insufficient documentation

## 2015-04-17 DIAGNOSIS — Z794 Long term (current) use of insulin: Secondary | ICD-10-CM | POA: Diagnosis not present

## 2015-04-17 DIAGNOSIS — Z8673 Personal history of transient ischemic attack (TIA), and cerebral infarction without residual deficits: Secondary | ICD-10-CM | POA: Insufficient documentation

## 2015-04-17 DIAGNOSIS — Z800 Family history of malignant neoplasm of digestive organs: Secondary | ICD-10-CM | POA: Diagnosis not present

## 2015-04-17 DIAGNOSIS — R918 Other nonspecific abnormal finding of lung field: Secondary | ICD-10-CM | POA: Diagnosis not present

## 2015-04-17 DIAGNOSIS — G35 Multiple sclerosis: Secondary | ICD-10-CM | POA: Diagnosis not present

## 2015-04-17 DIAGNOSIS — Z833 Family history of diabetes mellitus: Secondary | ICD-10-CM | POA: Diagnosis not present

## 2015-04-17 DIAGNOSIS — Z9114 Patient's other noncompliance with medication regimen: Secondary | ICD-10-CM

## 2015-04-17 DIAGNOSIS — Z85820 Personal history of malignant melanoma of skin: Secondary | ICD-10-CM | POA: Insufficient documentation

## 2015-04-17 DIAGNOSIS — E1142 Type 2 diabetes mellitus with diabetic polyneuropathy: Secondary | ICD-10-CM | POA: Insufficient documentation

## 2015-04-17 DIAGNOSIS — R55 Syncope and collapse: Secondary | ICD-10-CM | POA: Diagnosis not present

## 2015-04-17 DIAGNOSIS — Z8249 Family history of ischemic heart disease and other diseases of the circulatory system: Secondary | ICD-10-CM | POA: Insufficient documentation

## 2015-04-17 DIAGNOSIS — I712 Thoracic aortic aneurysm, without rupture: Secondary | ICD-10-CM | POA: Diagnosis not present

## 2015-04-17 DIAGNOSIS — E1122 Type 2 diabetes mellitus with diabetic chronic kidney disease: Secondary | ICD-10-CM | POA: Diagnosis not present

## 2015-04-17 DIAGNOSIS — N390 Urinary tract infection, site not specified: Secondary | ICD-10-CM | POA: Diagnosis not present

## 2015-04-17 DIAGNOSIS — Z955 Presence of coronary angioplasty implant and graft: Secondary | ICD-10-CM | POA: Insufficient documentation

## 2015-04-17 DIAGNOSIS — I951 Orthostatic hypotension: Secondary | ICD-10-CM | POA: Insufficient documentation

## 2015-04-17 DIAGNOSIS — I10 Essential (primary) hypertension: Secondary | ICD-10-CM | POA: Diagnosis present

## 2015-04-17 DIAGNOSIS — R0789 Other chest pain: Secondary | ICD-10-CM | POA: Insufficient documentation

## 2015-04-17 DIAGNOSIS — F332 Major depressive disorder, recurrent severe without psychotic features: Secondary | ICD-10-CM | POA: Diagnosis not present

## 2015-04-17 DIAGNOSIS — R079 Chest pain, unspecified: Secondary | ICD-10-CM

## 2015-04-17 DIAGNOSIS — R778 Other specified abnormalities of plasma proteins: Secondary | ICD-10-CM | POA: Diagnosis not present

## 2015-04-17 DIAGNOSIS — F419 Anxiety disorder, unspecified: Secondary | ICD-10-CM | POA: Diagnosis not present

## 2015-04-17 DIAGNOSIS — I050 Rheumatic mitral stenosis: Secondary | ICD-10-CM | POA: Diagnosis not present

## 2015-04-17 DIAGNOSIS — E538 Deficiency of other specified B group vitamins: Secondary | ICD-10-CM | POA: Diagnosis not present

## 2015-04-17 DIAGNOSIS — G43909 Migraine, unspecified, not intractable, without status migrainosus: Secondary | ICD-10-CM | POA: Diagnosis not present

## 2015-04-17 DIAGNOSIS — I255 Ischemic cardiomyopathy: Secondary | ICD-10-CM | POA: Diagnosis not present

## 2015-04-17 DIAGNOSIS — G8194 Hemiplegia, unspecified affecting left nondominant side: Secondary | ICD-10-CM | POA: Diagnosis not present

## 2015-04-17 DIAGNOSIS — D509 Iron deficiency anemia, unspecified: Secondary | ICD-10-CM | POA: Insufficient documentation

## 2015-04-17 DIAGNOSIS — Z86010 Personal history of colonic polyps: Secondary | ICD-10-CM | POA: Diagnosis not present

## 2015-04-17 LAB — BASIC METABOLIC PANEL
ANION GAP: 6 (ref 5–15)
BUN: 14 mg/dL (ref 6–20)
CALCIUM: 9.1 mg/dL (ref 8.9–10.3)
CHLORIDE: 104 mmol/L (ref 101–111)
CO2: 27 mmol/L (ref 22–32)
CREATININE: 1.35 mg/dL — AB (ref 0.44–1.00)
GFR, EST AFRICAN AMERICAN: 54 mL/min — AB (ref >60)
GFR, EST NON AFRICAN AMERICAN: 46 mL/min — AB (ref >60)
Glucose, Bld: 112 mg/dL — ABNORMAL HIGH (ref 65–99)
POTASSIUM: 4.2 mmol/L (ref 3.5–5.1)
SODIUM: 137 mmol/L (ref 135–145)

## 2015-04-17 LAB — CBC
HCT: 36 % (ref 35.0–47.0)
HEMOGLOBIN: 11.8 g/dL — AB (ref 12.0–16.0)
MCH: 23.7 pg — AB (ref 26.0–34.0)
MCHC: 32.7 g/dL (ref 32.0–36.0)
MCV: 72.4 fL — ABNORMAL LOW (ref 80.0–100.0)
PLATELETS: 187 10*3/uL (ref 150–440)
RBC: 4.96 MIL/uL (ref 3.80–5.20)
RDW: 23.4 % — AB (ref 11.5–14.5)
WBC: 9.2 10*3/uL (ref 3.6–11.0)

## 2015-04-17 LAB — TROPONIN I

## 2015-04-17 MED ORDER — CITALOPRAM HYDROBROMIDE 20 MG PO TABS: 40.0000 mg | ORAL_TABLET | Freq: Every day | ORAL | Status: DC

## 2015-04-17 MED ORDER — ASPIRIN 81 MG PO CHEW
324.0000 mg | CHEWABLE_TABLET | Freq: Once | ORAL | Status: AC
  Administered 2015-04-17: 324 mg via ORAL
  Filled 2015-04-17: qty 4

## 2015-04-17 MED ORDER — DILTIAZEM HCL ER COATED BEADS 120 MG PO CP24: 120.0000 mg | ORAL_CAPSULE | Freq: Every day | ORAL | Status: DC

## 2015-04-17 MED ORDER — CLOPIDOGREL BISULFATE 75 MG PO TABS: 75.0000 mg | ORAL_TABLET | Freq: Every day | ORAL | Status: DC

## 2015-04-17 MED ORDER — LOSARTAN POTASSIUM 25 MG PO TABS: 25.0000 mg | ORAL_TABLET | Freq: Every day | ORAL | Status: DC

## 2015-04-17 MED ORDER — PANTOPRAZOLE SODIUM 40 MG PO TBEC
40.0000 mg | DELAYED_RELEASE_TABLET | Freq: Two times a day (BID) | ORAL | Status: DC
  Administered 2015-04-17 – 2015-04-18 (×2): 40 mg via ORAL
  Filled 2015-04-17 (×2): qty 1

## 2015-04-17 MED ORDER — MORPHINE SULFATE (PF) 4 MG/ML IV SOLN
4.0000 mg | Freq: Once | INTRAVENOUS | Status: AC
Start: 2015-04-17 — End: 2015-04-17
  Administered 2015-04-17: 4 mg via INTRAVENOUS
  Filled 2015-04-17: qty 1

## 2015-04-17 MED ORDER — TRAZODONE HCL 100 MG PO TABS
200.0000 mg | ORAL_TABLET | Freq: Every day | ORAL | Status: DC
  Administered 2015-04-17: 200 mg via ORAL
  Filled 2015-04-17: qty 2

## 2015-04-17 MED ORDER — ROSUVASTATIN CALCIUM 20 MG PO TABS: 20.0000 mg | ORAL_TABLET | Freq: Every day | ORAL | Status: DC

## 2015-04-17 MED ORDER — CLOPIDOGREL BISULFATE 75 MG PO TABS
75.0000 mg | ORAL_TABLET | Freq: Every day | ORAL | Status: DC
  Administered 2015-04-18: 75 mg via ORAL
  Filled 2015-04-17: qty 1

## 2015-04-17 MED ORDER — CITALOPRAM HYDROBROMIDE 20 MG PO TABS
40.0000 mg | ORAL_TABLET | Freq: Every day | ORAL | Status: DC
  Administered 2015-04-18: 40 mg via ORAL
  Filled 2015-04-17: qty 2

## 2015-04-17 MED ORDER — LEVOTHYROXINE SODIUM 25 MCG PO TABS
25.0000 ug | ORAL_TABLET | Freq: Every day | ORAL | Status: DC
  Administered 2015-04-18: 25 ug via ORAL
  Filled 2015-04-17: qty 1

## 2015-04-17 MED ORDER — NITROGLYCERIN 0.4 MG SL SUBL
0.4000 mg | SUBLINGUAL_TABLET | Freq: Once | SUBLINGUAL | Status: AC
  Administered 2015-04-17: 0.4 mg via SUBLINGUAL
  Filled 2015-04-17: qty 1

## 2015-04-17 MED ORDER — PROMETHAZINE HCL 25 MG PO TABS: 25.0000 mg | ORAL_TABLET | Freq: Four times a day (QID) | ORAL | Status: DC | PRN

## 2015-04-17 MED ORDER — ASPIRIN EC 81 MG PO TBEC: 81.0000 mg | DELAYED_RELEASE_TABLET | Freq: Every day | ORAL | Status: DC

## 2015-04-17 MED ORDER — CARVEDILOL 12.5 MG PO TABS
25.0000 mg | ORAL_TABLET | Freq: Two times a day (BID) | ORAL | Status: DC
Start: 2015-04-17 — End: 2015-04-18
  Administered 2015-04-17: 25 mg via ORAL
  Filled 2015-04-17: qty 2

## 2015-04-17 MED ORDER — NITROGLYCERIN 0.4 MG SL SUBL
0.4000 mg | SUBLINGUAL_TABLET | SUBLINGUAL | Status: DC | PRN
Start: 2015-04-17 — End: 2015-04-18
  Administered 2015-04-17 – 2015-04-18 (×5): 0.4 mg via SUBLINGUAL
  Filled 2015-04-17: qty 1
  Filled 2015-04-17: qty 3

## 2015-04-17 MED ORDER — PREGABALIN 50 MG PO CAPS
100.0000 mg | ORAL_CAPSULE | Freq: Two times a day (BID) | ORAL | Status: DC
  Administered 2015-04-17 – 2015-04-18 (×2): 100 mg via ORAL
  Filled 2015-04-17 (×2): qty 2

## 2015-04-17 MED ORDER — ASPIRIN EC 81 MG PO TBEC
81.0000 mg | DELAYED_RELEASE_TABLET | Freq: Every day | ORAL | Status: DC
  Administered 2015-04-18: 81 mg via ORAL
  Filled 2015-04-17: qty 1

## 2015-04-17 MED ORDER — ISOSORBIDE MONONITRATE ER 30 MG PO TB24
30.0000 mg | ORAL_TABLET | Freq: Every day | ORAL | Status: DC
  Administered 2015-04-18: 30 mg via ORAL
  Filled 2015-04-17: qty 1

## 2015-04-17 MED ORDER — VITAMIN D (ERGOCALCIFEROL) 1.25 MG (50000 UNIT) PO CAPS: 50000.0000 [IU] | ORAL_CAPSULE | ORAL | Status: DC

## 2015-04-17 MED ORDER — HEPARIN SODIUM (PORCINE) 5000 UNIT/ML IJ SOLN
5000.0000 [IU] | Freq: Three times a day (TID) | INTRAMUSCULAR | Status: DC
  Administered 2015-04-17 – 2015-04-18 (×3): 5000 [IU] via SUBCUTANEOUS
  Filled 2015-04-17 (×3): qty 1

## 2015-04-17 MED ORDER — ASPIRIN 81 MG PO CHEW
CHEWABLE_TABLET | ORAL | Status: AC
  Filled 2015-04-17: qty 1

## 2015-04-17 MED ORDER — BACLOFEN 10 MG PO TABS: 10.0000 mg | ORAL_TABLET | Freq: Two times a day (BID) | ORAL | Status: DC | PRN

## 2015-04-17 NOTE — ED Notes (Signed)
Pt here for chest pain that radiates to left arm. Had catherization in October.

## 2015-04-17 NOTE — H&P (Signed)
[facility] [place] NAME: [name]    MR#:  [id number]  DATE OF BIRTH:  1970/06/07  DATE OF ADMISSION:  04/17/2015  PRIMARY CARE PHYSICIAN: [name], MD   REQUESTING/REFERRING PHYSICIAN: Edd Fabian  CHIEF COMPLAINT:   Chief Complaint  Patient presents with  . Chest Pain    HISTORY OF PRESENT ILLNESS: [name]  is a 45 y.o. female with a known history of colon polyp, coronary artery disease status post catheterization and stent placement in October 2016, cerebrovascular accident, multiple sclerosis, chronic systolic CHF, aortic arch aneurysm, ischemic cardiomyopathy, hypertension, diabetes, hyperlipidemia, chronic kidney disease, hypothyroidism- started having chest pain on - off since yesterday evening and was going to her left arm and making it feels numb. He did not had any correlation with activities. It was getting relieved by nitroglycerin sublingual tablets. Worried with this she called Dr.gollan's office, and he suggested to go to emergency room.  PAST MEDICAL HISTORY:   Past Medical History  Diagnosis Date  . Colon polyp   . GERD (gastroesophageal reflux disease)   . CAD (coronary artery disease)     a. cath 08/09/2003: mild LAD plaque, no obs dzs noted; b. 12/18/10 stress test w/o ischemia; c. cath 11/2014: LM: nl, mid 85-90% s/p PCI/DES, mid LCX 30%, RCA nl  . CVA (cerebral vascular accident) ([place])     a. 06/2008-TEE nl LV fxn; b. 01/2013 - notes indicate short run of SVT at that time  . Multiple sclerosis ([place]) 2015  . Syncope   . Chronic systolic CHF (congestive heart failure) ([place])     a. echo 01/29/2013 EF 40-45%, basal anteroseptal & mid anteroseptal segments abnl; b echo 05/2012 EF 50-55%, mildly dilated LA, nl RVSP  . Orthostatic hypotension   . Aortic arch aneurysm ([place])   . Ischemic cardiomyopathy   . MS (mitral stenosis)   . Malignant melanoma of skin of scalp ([place])   . Hypertension   . Depression    . Diabetes mellitus without complication ([place])   . Headache     Migrains  . Hyperlipidemia   . CKD (chronic kidney disease), stage IV ([place])   . Hypothyroidism   . Anxiety   . Gastric ulcer 04/27/2011  . Heart attack ([place])     PAST SURGICAL HISTORY: Past Surgical History  Procedure Laterality Date  . Appendectomy    . Right oophorectomy    . Tubal ligation      R tube removed still w/ Left  . Dilation and curettage of uterus    . Gastric bypass  09/2009    [facility]   . Esophagogastroduodenoscopy (egd) with propofol N/A 09/14/2014    Procedure: ESOPHAGOGASTRODUODENOSCOPY (EGD) WITH PROPOFOL;  Surgeon: Josefine Class, MD;  Location: [facility] ENDOSCOPY;  Service: Endoscopy;  Laterality: N/A;  . Cardiac catheterization N/A 11/09/2014    Procedure: Coronary Angiography;  Surgeon: Minna Merritts, MD;  Location: [place] CV LAB;  Service: Cardiovascular;  Laterality: N/A;  . Cardiac catheterization N/A 11/12/2014    Procedure: Coronary Stent Intervention;  Surgeon: Isaias Cowman, MD;  Location: [place] CV LAB;  Service: Cardiovascular;  Laterality: N/A;  . Trigger finger release    . Melanoma excision  2016    Dr. Evorn Gong    SOCIAL HISTORY:  Social History  Substance Use Topics  . Smoking status: Former Smoker    Types: Cigarettes    Quit date: 08/31/1994  . Smokeless  tobacco: Never Used     Comment: quit 28 years ago  . Alcohol Use: No    FAMILY HISTORY:  Family History  Problem Relation Age of Onset  . Hypertension Mother   . Anxiety disorder Mother   . Depression Mother   . Bipolar disorder Mother   . Heart disease Mother   . Hyperlipidemia Mother   . Kidney disease Father   . Heart disease Father   . Hypertension Father   . Diabetes Father   . Stroke Father   . Colon cancer Father     dx in his 80's  . Anxiety disorder Father   . Depression Father   . Skin cancer Father   . Kidney disease Sister   . Thyroid nodules Sister    . Hypertension Sister   . Hypertension Sister   . Diabetes Sister   . Hyperlipidemia Sister   . Depression Sister     DRUG ALLERGIES: No Known Allergies  REVIEW OF SYSTEMS:   CONSTITUTIONAL: No fever, fatigue or weakness.  EYES: No blurred or double vision.  EARS, NOSE, AND THROAT: No tinnitus or ear pain.  RESPIRATORY: No cough, shortness of breath, wheezing or hemoptysis.  CARDIOVASCULAR: Positive for chest pain, no orthopnea, edema.  GASTROINTESTINAL: No nausea, vomiting, diarrhea or abdominal pain.  GENITOURINARY: No dysuria, hematuria.  ENDOCRINE: No polyuria, nocturia,  HEMATOLOGY: No anemia, easy bruising or bleeding SKIN: No rash or lesion. MUSCULOSKELETAL: No joint pain or arthritis.   NEUROLOGIC: No tingling, numbness, weakness.  PSYCHIATRY: No anxiety or depression.   MEDICATIONS AT HOME:  Prior to Admission medications   Medication Sig Start Date End Date Taking? Authorizing Provider  aspirin EC 81 MG tablet Take 1 tablet (81 mg total) by mouth daily. 02/19/15  Yes Hildred Priest, MD  baclofen (LIORESAL) 10 MG tablet Take 1 tablet (10 mg total) by mouth 2 (two) times daily as needed for muscle spasms. 02/19/15  Yes Hildred Priest, MD  canagliflozin [facility]) 100 MG TABS tablet Take 1 tablet (100 mg total) by mouth daily before breakfast. 02/22/15  Yes [name], MD  carvedilol (COREG) 25 MG tablet Take 1 tablet (25 mg total) by mouth 2 (two) times daily. 11/23/14  Yes Ryan M Dunn, PA-C  citalopram (CELEXA) 40 MG tablet Take 1 tablet (40 mg total) by mouth daily. 12/05/14  Yes Marjie Skiff, MD  clopidogrel (PLAVIX) 75 MG tablet Take 1 tablet (75 mg total) by mouth daily. 11/23/14  Yes Ryan M Dunn, PA-C  diltiazem (CARDIZEM CD) 120 MG 24 hr capsule Take 1 capsule (120 mg total) by mouth daily. 01/07/15  Yes Minna Merritts, MD  eszopiclone (LUNESTA) 2 MG TABS tablet Take 2 mg by mouth at bedtime as needed for sleep. Take immediately before  bedtime   Yes Historical Provider, MD  isosorbide mononitrate (IMDUR) 30 MG 24 hr tablet Take 1 tablet (30 mg total) by mouth daily. 11/23/14  Yes Ryan M Dunn, PA-C  levothyroxine (SYNTHROID, LEVOTHROID) 25 MCG tablet Take 25 mcg by mouth daily before breakfast.   Yes Historical Provider, MD  losartan (COZAAR) 25 MG tablet Take 25 mg by mouth daily.    Yes Historical Provider, MD  metFORMIN (GLUCOPHAGE) 500 MG tablet Take 1,000 mg by mouth at bedtime.   Yes Historical Provider, MD  pantoprazole (PROTONIX) 40 MG tablet Take 40 mg by mouth 2 (two) times daily.    Yes Historical Provider, MD  pregabalin (LYRICA) 100 MG capsule Take 1 capsule (  100 mg total) by mouth 2 (two) times daily. 01/03/15  Yes [name], MD  promethazine (PHENERGAN) 25 MG tablet Take 25 mg by mouth every 6 (six) hours as needed for nausea or vomiting.    Yes Historical Provider, MD  rosuvastatin (CRESTOR) 20 MG tablet Take 1 tablet (20 mg total) by mouth daily at 6 PM. 11/13/14  Yes Srikar Sudini, MD  traZODone (DESYREL) 100 MG tablet Take 200 mg by mouth at bedtime.   Yes Historical Provider, MD  Vitamin D, Ergocalciferol, (DRISDOL) 50000 units CAPS capsule Take 50,000 Units by mouth every 7 (seven) days. Pt takes on Saturday.   Yes Historical Provider, MD      PHYSICAL EXAMINATION:   VITAL SIGNS: Blood pressure 171/75, pulse 54, temperature 98.3 F (36.8 C), temperature source Oral, resp. rate 16, weight 83.915 kg (185 lb), SpO2 93 %.  GENERAL:  45 y.o.-year-old patient lying in the bed with no acute distress.  EYES: Pupils equal, round, reactive to light and accommodation. No scleral icterus. Extraocular muscles intact.  HEENT: Head atraumatic, normocephalic. Oropharynx and nasopharynx clear.  NECK:  Supple, no jugular venous distention. No thyroid enlargement, no tenderness.  LUNGS: Normal breath sounds bilaterally, no wheezing, rales,rhonchi or crepitation. No use of accessory muscles of respiration.   CARDIOVASCULAR: S1, S2 normal. No murmurs, rubs, or gallops.  ABDOMEN: Soft, nontender, nondistended. Bowel sounds present. No organomegaly or mass.  EXTREMITIES: No pedal edema, cyanosis, or clubbing.  NEUROLOGIC: Cranial nerves II through XII are intact. Muscle strength 5/5 in all extremities. Sensation intact. Gait not checked.  PSYCHIATRIC: The patient is alert and oriented x 3.  SKIN: No obvious rash, lesion, or ulcer.   LABORATORY PANEL:   CBC  Recent Labs Lab 04/17/15 1729  WBC 9.2  HGB 11.8*  HCT 36.0  PLT 187  MCV 72.4*  MCH 23.7*  MCHC 32.7  RDW 23.4*   ------------------------------------------------------------------------------------------------------------------  Chemistries   Recent Labs Lab 04/17/15 1729  NA 137  K 4.2  CL 104  CO2 27  GLUCOSE 112*  BUN 14  CREATININE 1.35*  CALCIUM 9.1   ------------------------------------------------------------------------------------------------------------------ estimated creatinine clearance is 53.4 mL/min (by C-G formula based on Cr of 1.35). ------------------------------------------------------------------------------------------------------------------ No results for input(s): TSH, T4TOTAL, T3FREE, THYROIDAB in the last 72 hours.  Invalid input(s): FREET3   Coagulation profile No results for input(s): INR, PROTIME in the last 168 hours. ------------------------------------------------------------------------------------------------------------------- No results for input(s): DDIMER in the last 72 hours. -------------------------------------------------------------------------------------------------------------------  Cardiac Enzymes  Recent Labs Lab 04/17/15 1729  TROPONINI <0.03   ------------------------------------------------------------------------------------------------------------------ Invalid input(s):  POCBNP  ---------------------------------------------------------------------------------------------------------------  Urinalysis    Component Value Date/Time   COLORURINE STRAW* 02/18/2015 1309   COLORURINE Yellow 12/29/2013 1210   APPEARANCEUR CLEAR* 02/18/2015 1309   APPEARANCEUR Cloudy* 07/17/2014 1455   APPEARANCEUR Clear 12/29/2013 1210   LABSPEC 1.007 02/18/2015 1309   LABSPEC 1.012 12/29/2013 1210   PHURINE 6.0 02/18/2015 1309   PHURINE 5.0 12/29/2013 1210   GLUCOSEU >500* 02/18/2015 1309   GLUCOSEU >=500 12/29/2013 1210   HGBUR NEGATIVE 02/18/2015 1309   HGBUR Negative 12/29/2013 1210   HGBUR negative 04/14/2010 1435   BILIRUBINUR negative 04/05/2015 [place] 02/18/2015 1309   BILIRUBINUR Negative 07/17/2014 1455   BILIRUBINUR Negative 12/29/2013 1210   KETONESUR NEGATIVE 02/18/2015 1309   KETONESUR Negative 12/29/2013 1210   PROTEINUR Trace 04/05/2015 1147   PROTEINUR 30* 02/18/2015 1309   PROTEINUR Negative 07/17/2014 1455   PROTEINUR Negative 12/29/2013 1210   UROBILINOGEN  0.2 04/05/2015 1147   UROBILINOGEN negative 04/14/2010 1435   NITRITE negative 04/05/2015 1147   NITRITE NEGATIVE 02/18/2015 1309   NITRITE Negative 07/17/2014 1455   NITRITE Negative 12/29/2013 1210   LEUKOCYTESUR moderate (2+)* 04/05/2015 1147   LEUKOCYTESUR Trace* 07/17/2014 1455   LEUKOCYTESUR Negative 12/29/2013 1210     RADIOLOGY: Dg Chest 2 View  04/17/2015  CLINICAL DATA:  Acute onset of generalized chest pain, radiating to the left arm. Initial encounter. EXAM: CHEST  2 VIEW COMPARISON:  Chest radiograph performed 02/16/2015 FINDINGS: The lungs are well-aerated. Mild peribronchial thickening is noted. There is no evidence of focal opacification, pleural effusion or pneumothorax. The heart is normal in size; the mediastinal contour is within normal limits. Postoperative change is noted about the left side of the superior mediastinum. No acute osseous  abnormalities are seen. IMPRESSION: Mild peribronchial thickening noted.  Lungs otherwise clear Electronically Signed   By: Garald Balding M.D.   On: 04/17/2015 18:36    EKG: Orders placed or performed during the hospital encounter of 04/17/15  . EKG 12-Lead  . EKG 12-Lead  . ED EKG within 10 minutes  . ED EKG within 10 minutes    IMPRESSION AND PLAN:  * Chest pain   Monitor on telemetry, follow serial troponin, get cardiology consult with Dr. Greig Castilla.   Keep nothing by mouth from midnight, if he decided to go for cardiac catheterization tomorrow morning.  * Coronary artery disease status post stent   SHe is on aspirin and Plavix and other cardiac medications, and confirms that she did not miss any of her tablets.  * Hypertension  Continue home medications.  * Chronic kidney disease   Creatinine appears to be at baseline.  * Hypothyroidism   Continue levothyroxine  * Hyperlipidemia   Continue rosuvastatin.  All the records are reviewed and case discussed with ED provider. Management plans discussed with the patient, family and they are in agreement.  CODE STATUS: Code Status History    Date Active Date Inactive Code Status Order ID Comments User Context   02/15/2015  8:04 PM 02/20/2015  7:03 PM Full Code [id number]  Gonzella Lex, MD Inpatient   11/12/2014  8:33 AM 11/13/2014  2:49 PM Full Code [id number]  Isaias Cowman, MD Inpatient   11/07/2014  9:53 PM 11/12/2014  8:33 AM Full Code [id number]  Henreitta Leber, MD Inpatient       TOTAL TIME TAKING CARE OF THIS PATIENT: 50 minutes.    Vaughan Basta M.D on 04/17/2015   Between 7am to 6pm - Pager - [phone]  After 6pm go to [email or web address] - password [email or web address] [facility]  Office  [phone]  CC: Primary care physician; [name], MD   Note: This dictation was prepared with Dragon dictation along with smaller phrase technology. Any transcriptional errors that result from this  process are unintentional.

## 2015-04-17 NOTE — ED Provider Notes (Signed)
[facility] Emergency Department Provider Note  ____________________________________________  Time seen: Approximately 5:27 PM  I have reviewed the triage vital signs and the nursing notes.   HISTORY  Chief Complaint Chest Pain    HPI [name] is a 45 y.o. female with CAD s/p PCI/DES to LAD, numerous prior strokes, chronic systolic CHF, aortic arch aneurysm, aberrant left subclavian artery s/p bypass surgery, s/p gastric bypass surgery, CKD stage III, multiple sclerosis, orthostatic hypotension, HTN, HLD, syncope, hypothyroidism, migraine disorder, anxiety, and depression who presents for evaluation of intermittent anterior chest discomfort since last night, gradual onset, intermittent, currently moderate, improves with nitroglycerin. Patient reports that the pain does radiate into the left arm and feels similar to when she presented presented for chest pain and required LAD stent. She reports that her pain was much worse when she tried to do her normal exercise routine on the treadmill this morning. She has had some shortness of breath, nausea, diaphoresis. No vomiting, diarrhea, fevers or chills.   Past Medical History  Diagnosis Date  . Colon polyp   . GERD (gastroesophageal reflux disease)   . CAD (coronary artery disease)     a. cath 08/09/2003: mild LAD plaque, no obs dzs noted; b. 12/18/10 stress test w/o ischemia; c. cath 11/2014: LM: nl, mid 85-90% s/p PCI/DES, mid LCX 30%, RCA nl  . CVA (cerebral vascular accident) ([place])     a. 06/2008-TEE nl LV fxn; b. 01/2013 - notes indicate short run of SVT at that time  . Multiple sclerosis ([place]) 2015  . Syncope   . Chronic systolic CHF (congestive heart failure) ([place])     a. echo 01/29/2013 EF 40-45%, basal anteroseptal & mid anteroseptal segments abnl; b echo 05/2012 EF 50-55%, mildly dilated LA, nl RVSP  . Orthostatic hypotension   . Aortic arch aneurysm ([place])   . Ischemic cardiomyopathy   . MS (mitral  stenosis)   . Malignant melanoma of skin of scalp ([place])   . Hypertension   . Depression   . Diabetes mellitus without complication ([place])   . Headache     Migrains  . Hyperlipidemia   . CKD (chronic kidney disease), stage IV ([place])   . Hypothyroidism   . Anxiety   . Gastric ulcer 04/27/2011  . Heart attack [facility])     Patient Active Problem List   Diagnosis Date Noted  . Urinary tract infection 04/05/2015  . Iron deficiency anemia 03/22/2015  . Depression 02/22/2015  . Diabetes type 2, controlled ([place]) 02/19/2015  . Vitamin B12 deficiency 02/18/2015  . Non compliance w medication regimen 02/18/2015  . Misuse of medications for pain 02/18/2015  . Chest pain 02/16/2015  . Severe recurrent major depression without psychotic features ([place]) 02/15/2015  . Helicobacter pylori infection 11/23/2014  . Hemiparesis, left ([place]) 11/23/2014  . Ischemic cardiomyopathy   . Benign neoplasm of colon 11/20/2014  . Coronary artery disease involving native coronary artery of native heart with angina pectoris with documented spasm ([place])   . Malignant melanoma ([place]) 08/25/2014  . Chronic kidney disease (CKD), stage III (moderate) 08/25/2014  . Chronic systolic CHF (congestive heart failure) ([place])   . Incomplete bladder emptying 07/12/2014  . Adult hypothyroidism 12/30/2013  . Aberrant subclavian artery 11/17/2013  . Multiple sclerosis ([place]) 11/02/2013  . Cerebrovascular accident (CVA) ([place]) 06/20/2013  . Headache, migraine 05/29/2013  . Diabetes mellitus ([place]) 05/29/2013  . HLD (hyperlipidemia) 05/29/2013  . Type 2 diabetes mellitus ([place]) 12/12/2012  . Hyperlipidemia   .  GERD (gastroesophageal reflux disease)   . Neuropathy ([place]) 01/02/2011  . CVA (cerebral vascular accident) ([place]) 06/21/2008  . Essential hypertension 05/01/2008    Past Surgical History  Procedure Laterality Date  . Appendectomy    . Right oophorectomy    . Tubal ligation      R tube removed still w/ Left  . Dilation and  curettage of uterus    . Gastric bypass  09/2009    [facility]   . Esophagogastroduodenoscopy (egd) with propofol N/A 09/14/2014    Procedure: ESOPHAGOGASTRODUODENOSCOPY (EGD) WITH PROPOFOL;  Surgeon: Josefine Class, MD;  Location: [facility] ENDOSCOPY;  Service: Endoscopy;  Laterality: N/A;  . Cardiac catheterization N/A 11/09/2014    Procedure: Coronary Angiography;  Surgeon: Minna Merritts, MD;  Location: [place] CV LAB;  Service: Cardiovascular;  Laterality: N/A;  . Cardiac catheterization N/A 11/12/2014    Procedure: Coronary Stent Intervention;  Surgeon: Isaias Cowman, MD;  Location: [place] CV LAB;  Service: Cardiovascular;  Laterality: N/A;  . Trigger finger release    . Melanoma excision  2016    Dr. Evorn Gong    Current Outpatient Rx  Name  Route  Sig  Dispense  Refill  . aspirin EC 81 MG tablet   Oral   Take 1 tablet (81 mg total) by mouth daily.         . baclofen (LIORESAL) 10 MG tablet   Oral   Take 1 tablet (10 mg total) by mouth 2 (two) times daily as needed for muscle spasms.      0   . canagliflozin (INVOKANA) 100 MG TABS tablet   Oral   Take 1 tablet (100 mg total) by mouth daily before breakfast.   30 tablet   5   . carvedilol (COREG) 25 MG tablet   Oral   Take 1 tablet (25 mg total) by mouth 2 (two) times daily.   60 tablet   6   . citalopram (CELEXA) 40 MG tablet   Oral   Take 1 tablet (40 mg total) by mouth daily.   30 tablet   3   . clopidogrel (PLAVIX) 75 MG tablet   Oral   Take 1 tablet (75 mg total) by mouth daily.   30 tablet   5   . diltiazem (CARDIZEM CD) 120 MG 24 hr capsule   Oral   Take 1 capsule (120 mg total) by mouth daily.   30 capsule   11   . eszopiclone (LUNESTA) 2 MG TABS tablet   Oral   Take 1 tablet (2 mg total) by mouth at bedtime as needed for sleep. Take immediately before bedtime   30 tablet   1   . isosorbide mononitrate (IMDUR) 30 MG 24 hr tablet   Oral   Take 1 tablet (30  mg total) by mouth daily.   30 tablet   5   . levothyroxine (SYNTHROID, LEVOTHROID) 25 MCG tablet   Oral   Take 25 mcg by mouth daily before breakfast.         . losartan (COZAAR) 25 MG tablet   Oral   Take 25 mg by mouth daily.          Marland Kitchen EXPIRED: metFORMIN (GLUCOPHAGE) 1000 MG tablet   Oral   Take 1 tablet (1,000 mg total) by mouth daily.   1 tablet   1   . pantoprazole (PROTONIX) 40 MG tablet   Oral   Take 40 mg by mouth  2 (two) times daily.          . pregabalin (LYRICA) 100 MG capsule   Oral   Take 1 capsule (100 mg total) by mouth 2 (two) times daily.   60 capsule   5   . promethazine (PHENERGAN) 25 MG tablet   Oral   Take 25 mg by mouth as needed.          . rosuvastatin (CRESTOR) 20 MG tablet   Oral   Take 1 tablet (20 mg total) by mouth daily at 6 PM.   30 tablet   0   . traZODone (DESYREL) 100 MG tablet   Oral   Take 200 mg by mouth at bedtime.         . Vitamin D, Ergocalciferol, (DRISDOL) 50000 units CAPS capsule   Oral   Take 50,000 Units by mouth every 7 (seven) days.            Allergies Tramadol  Family History  Problem Relation Age of Onset  . Hypertension Mother   . Anxiety disorder Mother   . Depression Mother   . Bipolar disorder Mother   . Heart disease Mother   . Hyperlipidemia Mother   . Kidney disease Father   . Heart disease Father   . Hypertension Father   . Diabetes Father   . Stroke Father   . Colon cancer Father     dx in his 24's  . Anxiety disorder Father   . Depression Father   . Skin cancer Father   . Kidney disease Sister   . Thyroid nodules Sister   . Hypertension Sister   . Hypertension Sister   . Diabetes Sister   . Hyperlipidemia Sister   . Depression Sister     Social History Social History  Substance Use Topics  . Smoking status: Former Smoker    Types: Cigarettes    Quit date: 08/31/1994  . Smokeless tobacco: Never Used     Comment: quit 28 years ago  . Alcohol Use: No     Review of Systems Constitutional: No fever/chills Eyes: No visual changes. ENT: No sore throat. Cardiovascular: +chest pain. Respiratory: +shortness of breath. Gastrointestinal: No abdominal pain.  No nausea, no vomiting.  No diarrhea.  No constipation. Genitourinary: Negative for dysuria. Musculoskeletal: Negative for back pain. Skin: Negative for rash. Neurological: Negative for headaches, focal weakness or numbness.  10-point ROS otherwise negative.  ____________________________________________   PHYSICAL EXAM:  VITAL SIGNS: ED Triage Vitals  Enc Vitals Group     BP 04/17/15 1720 178/85 mmHg     Pulse Rate 04/17/15 1720 59     Resp 04/17/15 1720 18     Temp 04/17/15 1720 98.3 F (36.8 C)     Temp Source 04/17/15 1720 Oral     SpO2 04/17/15 1720 95 %     Weight 04/17/15 1720 185 lb (83.915 kg)     Height --      Head Cir --      Peak Flow --      Pain Score 04/17/15 1722 8     Pain Loc --      Pain Edu? --      Excl. in [place]? --     Constitutional: Alert and oriented. Well appearing and in no acute distress. Eyes: Conjunctivae are normal. PERRL. EOMI. Head: Atraumatic. Nose: No congestion/rhinnorhea. Mouth/Throat: Mucous membranes are moist.  Oropharynx non-erythematous. Neck: No stridor. Supple without meningismus. Cardiovascular: Normal rate, regular rhythm. Grossly  normal heart sounds.  Good peripheral circulation. Respiratory: Normal respiratory effort.  No retractions. Lungs CTAB. Gastrointestinal: Soft and nontender. No distention. No abdominal bruits. No CVA tenderness. Genitourinary: deferred Musculoskeletal: No lower extremity tenderness nor edema.  No joint effusions. Neurologic:  Normal speech and language. No gross focal neurologic deficits are appreciated. No gait instability. Skin:  Skin is warm, dry and intact. No rash noted. Psychiatric: Mood and affect are normal. Speech and behavior are  normal.  ____________________________________________   LABS (all labs ordered are listed, but only abnormal results are displayed)  Labs Reviewed  BASIC METABOLIC PANEL - Abnormal; Notable for the following:    Glucose, Bld 112 (*)    Creatinine, Ser 1.35 (*)    GFR calc non Af Amer 46 (*)    GFR calc Af Amer 54 (*)    All other components within normal limits  CBC - Abnormal; Notable for the following:    Hemoglobin 11.8 (*)    MCV 72.4 (*)    MCH 23.7 (*)    RDW 23.4 (*)    All other components within normal limits  TROPONIN I   ____________________________________________  EKG  ED ECG REPORT I, Joanne Gavel, the attending physician, personally viewed and interpreted this ECG.   Date: 04/17/2015  EKG Time: 17:19  Rate: 69  Rhythm: normal EKG, normal sinus rhythm  Axis: normal  Intervals:none  ST&T Change: No acute ST elevation.  ____________________________________________  RADIOLOGY  CXR  IMPRESSION: Mild peribronchial thickening noted. Lungs otherwise clear  ____________________________________________   PROCEDURES  Procedure(s) performed: None  Critical Care performed: No  ____________________________________________   INITIAL IMPRESSION / ASSESSMENT AND PLAN / ED COURSE  Pertinent labs & imaging results that were available during my care of the patient were reviewed by me and considered in my medical decision making (see chart for details).  [name] is a 45 y.o. female with CAD s/p PCI/DES to LAD, numerous prior strokes, chronic systolic CHF, aortic arch aneurysm, aberrant left subclavian artery s/p bypass surgery, s/p gastric bypass surgery, CKD stage III, multiple sclerosis, orthostatic hypotension, HTN, HLD, syncope, hypothyroidism, migraine disorder, anxiety, and depression who presents for evaluation of intermittent anterior chest discomfort. On exam, she is nontoxic appearing and in no acute distress. Vital signs stable, she is  afebrile. EKG shows normal sinus rhythm, initial troponin negative. CBC with mild anemia. Creatinine mildly elevated at 1.35. Pain resolved at this time after sublingual nitroglycerin as well as morphine. Concern for ACS given known artery disease, similar pain requiring acute cardiac intervention. Clinical picture is not consistent with PE or acute aortic dissection. Aspirin ordered. Case discussed with the hospitalist, Dr. Anselm Jungling, for admission at 7:30 PM. ____________________________________________   FINAL CLINICAL IMPRESSION(S) / ED DIAGNOSES  Final diagnoses:  Chest pain, unspecified chest pain type      Joanne Gavel, MD 04/17/15 1930

## 2015-04-18 ENCOUNTER — Encounter
Admission: EM | Disposition: A | Discharge status: Home / Self Care | Payer: Self-pay | Source: Home / Self Care | Attending: Student

## 2015-04-18 ENCOUNTER — Telehealth: Payer: Self-pay | Admitting: Family Medicine

## 2015-04-18 DIAGNOSIS — N183 Chronic kidney disease, stage 3 (moderate): Secondary | ICD-10-CM | POA: Diagnosis not present

## 2015-04-18 DIAGNOSIS — I10 Essential (primary) hypertension: Secondary | ICD-10-CM

## 2015-04-18 DIAGNOSIS — E785 Hyperlipidemia, unspecified: Secondary | ICD-10-CM

## 2015-04-18 DIAGNOSIS — I25110 Atherosclerotic heart disease of native coronary artery with unstable angina pectoris: Secondary | ICD-10-CM

## 2015-04-18 DIAGNOSIS — I208 Other forms of angina pectoris: Secondary | ICD-10-CM

## 2015-04-18 DIAGNOSIS — I209 Angina pectoris, unspecified: Secondary | ICD-10-CM

## 2015-04-18 DIAGNOSIS — I2510 Atherosclerotic heart disease of native coronary artery without angina pectoris: Secondary | ICD-10-CM | POA: Diagnosis not present

## 2015-04-18 DIAGNOSIS — Z9114 Patient's other noncompliance with medication regimen: Secondary | ICD-10-CM

## 2015-04-18 DIAGNOSIS — E1122 Type 2 diabetes mellitus with diabetic chronic kidney disease: Secondary | ICD-10-CM | POA: Insufficient documentation

## 2015-04-18 DIAGNOSIS — R0789 Other chest pain: Secondary | ICD-10-CM | POA: Insufficient documentation

## 2015-04-18 DIAGNOSIS — E118 Type 2 diabetes mellitus with unspecified complications: Secondary | ICD-10-CM

## 2015-04-18 HISTORY — PX: CARDIAC CATHETERIZATION: SHX172

## 2015-04-18 LAB — BASIC METABOLIC PANEL
Anion gap: 6 (ref 5–15)
BUN: 15 mg/dL (ref 6–20)
CHLORIDE: 105 mmol/L (ref 101–111)
CO2: 27 mmol/L (ref 22–32)
CREATININE: 1.33 mg/dL — AB (ref 0.44–1.00)
Calcium: 8.8 mg/dL — ABNORMAL LOW (ref 8.9–10.3)
GFR calc Af Amer: 55 mL/min — ABNORMAL LOW (ref >60)
GFR calc non Af Amer: 47 mL/min — ABNORMAL LOW (ref >60)
GLUCOSE: 107 mg/dL — AB (ref 65–99)
POTASSIUM: 4.3 mmol/L (ref 3.5–5.1)
Sodium: 138 mmol/L (ref 135–145)

## 2015-04-18 LAB — CBC
HEMATOCRIT: 35.5 % (ref 35.0–47.0)
Hemoglobin: 11.5 g/dL — ABNORMAL LOW (ref 12.0–16.0)
MCH: 23.9 pg — AB (ref 26.0–34.0)
MCHC: 32.4 g/dL (ref 32.0–36.0)
MCV: 73.6 fL — AB (ref 80.0–100.0)
Platelets: 190 10*3/uL (ref 150–440)
RBC: 4.82 MIL/uL (ref 3.80–5.20)
RDW: 23.3 % — AB (ref 11.5–14.5)
WBC: 7.5 10*3/uL (ref 3.6–11.0)

## 2015-04-18 LAB — TROPONIN I
Troponin I: <0.03 ng/mL (ref <0.031)
Troponin I: <0.03 ng/mL (ref <0.031)

## 2015-04-18 LAB — GLUCOSE, CAPILLARY: Glucose-Capillary: 104 mg/dL — ABNORMAL HIGH (ref 65–99)

## 2015-04-18 SURGERY — LEFT HEART CATH AND CORONARY ANGIOGRAPHY

## 2015-04-18 SURGERY — LEFT HEART CATH AND CORONARY ANGIOGRAPHY
Anesthesia: Moderate Sedation

## 2015-04-18 MED ORDER — NITROGLYCERIN 0.4 MG SL SUBL
SUBLINGUAL_TABLET | SUBLINGUAL | Status: AC
  Filled 2015-04-18: qty 1

## 2015-04-18 MED ORDER — CITALOPRAM HYDROBROMIDE 40 MG PO TABS: 40.0000 mg | ORAL_TABLET | Freq: Every day | ORAL | Status: DC

## 2015-04-18 MED ORDER — FENTANYL CITRATE (PF) 100 MCG/2ML IJ SOLN
INTRAMUSCULAR | Status: AC
  Filled 2015-04-18: qty 2

## 2015-04-18 MED ORDER — SODIUM CHLORIDE 0.9 % WEIGHT BASED INFUSION: 1.0000 mL/kg/h | INTRAVENOUS | Status: DC

## 2015-04-18 MED ORDER — METFORMIN HCL 500 MG PO TABS: 1000.0000 mg | ORAL_TABLET | Freq: Every day | ORAL | Status: DC

## 2015-04-18 MED ORDER — ROSUVASTATIN CALCIUM 20 MG PO TABS: 20.0000 mg | ORAL_TABLET | Freq: Every day | ORAL | Status: DC

## 2015-04-18 MED ORDER — MIDAZOLAM HCL 2 MG/2ML IJ SOLN
INTRAMUSCULAR | Status: DC | PRN
  Administered 2015-04-18: 1 mg via INTRAVENOUS
  Administered 2015-04-18: 0.5 mg via INTRAVENOUS

## 2015-04-18 MED ORDER — NITROGLYCERIN 0.4 MG SL SUBL: 0.4000 mg | SUBLINGUAL_TABLET | SUBLINGUAL | Status: DC | PRN

## 2015-04-18 MED ORDER — ACETAMINOPHEN 325 MG PO TABS: 650.0000 mg | ORAL_TABLET | ORAL | Status: DC | PRN

## 2015-04-18 MED ORDER — SODIUM CHLORIDE 0.9 % WEIGHT BASED INFUSION
3.0000 mL/kg/h | INTRAVENOUS | Status: AC
  Administered 2015-04-18: 3 mL/kg/h via INTRAVENOUS

## 2015-04-18 MED ORDER — SODIUM CHLORIDE 0.9% FLUSH: 3.0000 mL | INTRAVENOUS | Status: DC | PRN

## 2015-04-18 MED ORDER — CLOPIDOGREL BISULFATE 75 MG PO TABS: 75.0000 mg | ORAL_TABLET | Freq: Every day | ORAL | Status: DC

## 2015-04-18 MED ORDER — PANTOPRAZOLE SODIUM 40 MG PO TBEC: 40.0000 mg | DELAYED_RELEASE_TABLET | Freq: Two times a day (BID) | ORAL | Status: DC

## 2015-04-18 MED ORDER — CARVEDILOL 25 MG PO TABS
25.0000 mg | ORAL_TABLET | Freq: Two times a day (BID) | ORAL | Status: DC
Start: 2015-04-18 — End: 2015-09-27

## 2015-04-18 MED ORDER — MIDAZOLAM HCL 2 MG/2ML IJ SOLN
INTRAMUSCULAR | Status: AC
  Filled 2015-04-18: qty 2

## 2015-04-18 MED ORDER — ONDANSETRON HCL 4 MG/2ML IJ SOLN: 4.0000 mg | Freq: Four times a day (QID) | INTRAMUSCULAR | Status: DC | PRN

## 2015-04-18 MED ORDER — LEVOTHYROXINE SODIUM 25 MCG PO TABS: 25.0000 ug | ORAL_TABLET | Freq: Every day | ORAL | Status: DC

## 2015-04-18 MED ORDER — SODIUM CHLORIDE 0.9 % IV SOLN: 250.0000 mL | INTRAVENOUS | Status: DC | PRN

## 2015-04-18 MED ORDER — HEPARIN (PORCINE) IN NACL 2-0.9 UNIT/ML-% IJ SOLN
INTRAMUSCULAR | Status: AC
  Filled 2015-04-18: qty 500

## 2015-04-18 MED ORDER — DILTIAZEM HCL ER COATED BEADS 120 MG PO CP24: 120.0000 mg | ORAL_CAPSULE | Freq: Every day | ORAL | Status: DC

## 2015-04-18 MED ORDER — IOHEXOL 300 MG/ML  SOLN
INTRAMUSCULAR | Status: DC | PRN
  Administered 2015-04-18: 180 mL via INTRA_ARTERIAL

## 2015-04-18 MED ORDER — SODIUM CHLORIDE 0.9% FLUSH
3.0000 mL | Freq: Two times a day (BID) | INTRAVENOUS | Status: DC
  Administered 2015-04-18: 3 mL via INTRAVENOUS

## 2015-04-18 MED ORDER — ISOSORBIDE MONONITRATE ER 60 MG PO TB24: 60.0000 mg | ORAL_TABLET | Freq: Every day | ORAL | Status: DC

## 2015-04-18 MED ORDER — ASPIRIN 81 MG PO CHEW: 81.0000 mg | CHEWABLE_TABLET | ORAL | Status: DC

## 2015-04-18 MED ORDER — SODIUM CHLORIDE 0.9 % WEIGHT BASED INFUSION
3.0000 mL/kg/h | INTRAVENOUS | Status: AC
Start: 2015-04-18 — End: 2015-04-18

## 2015-04-18 MED ORDER — FENTANYL CITRATE (PF) 100 MCG/2ML IJ SOLN
INTRAMUSCULAR | Status: DC | PRN
  Administered 2015-04-18: 50 ug via INTRAVENOUS
  Administered 2015-04-18: 25 ug via INTRAVENOUS

## 2015-04-18 MED ORDER — LOSARTAN POTASSIUM 25 MG PO TABS: 25.0000 mg | ORAL_TABLET | Freq: Every day | ORAL | Status: DC

## 2015-04-18 SURGICAL SUPPLY — 10 items
CATH INFINITI 5FR ANG PIGTAIL (CATHETERS) ×3 IMPLANT
CATH INFINITI 5FR JL4 (CATHETERS) ×3 IMPLANT
CATH INFINITI JR4 5F (CATHETERS) ×3 IMPLANT
KIT MANI 3VAL PERCEP (MISCELLANEOUS) ×3 IMPLANT
NDL PERC 18GX7CM (NEEDLE) ×1 IMPLANT
NEEDLE PERC 18GX7CM (NEEDLE) ×3 IMPLANT
NEEDLE SMART 18G ACCESS (NEEDLE) ×6 IMPLANT
PACK CARDIAC CATH (CUSTOM PROCEDURE TRAY) ×3 IMPLANT
SHEATH AVANTI 5FR X 11CM (SHEATH) ×3 IMPLANT
WIRE EMERALD 3MM-J .035X150CM (WIRE) ×3 IMPLANT

## 2015-04-18 NOTE — Telephone Encounter (Signed)
Pt is scheduled for hospital f/u on 05/02/15. Pt is being discharged today 04/18/15 and was treating for chest pains. Thanks TNP

## 2015-04-18 NOTE — Progress Notes (Signed)
Patient is discharged to home with spouse. Escorted vis wheelchair by staff. Discharge instructions completed and explained. Pt is in agreement with plan of care.

## 2015-04-18 NOTE — Consult Note (Signed)
Cardiology Consult    Patient ID: [name] MRN: [id number], DOB/AGE: [birth year]   Admit date: 04/17/2015 Date of Consult: 04/18/2015  Primary Physician: [name], MD Reason for Consult: Chest Pain Primary Cardiologist: Dr. [name] Requesting Provider: Dr. Anselm Jungling   History of Present Illness    [name] is a 45 y.o. female with past medical history of CAD (cath 11/2014 with 85-90% stenosis of mid-LAD, s/p PCI with DES), prior CVA's, chronic systolic CHF, aortic arch aneurysm, aberrant left subclavian artery (s/p bypass), Stage 3 CKD, MS, Type 2 DM, HTN, HLD, anxiety, and depression who presented to [facility] on 04/17/2015 for evaluation of chest pain.   The patient reports she developed pain at rest two days ago, lasting 10-15 minutes and relieved with SL NTG. She reports associated diaphoresis and dyspnea. While she was walking on the treadmill yesterday, she developed a central chest pressure after less than 3 minutes of exercise. She is usually able to walk 30-45 minutes without any symptoms. After returning home, she had repeat symptoms and was very diaphoretic while at rest. She also reported having a numbing sensation going down her left arm. Reports these symptoms are similar to what she experienced in 11/2014 when she required a stent.  She reports only taking her medications a few days per week, due to trying to "stretch them out" in regards to the cost of the medication. Only takes her ASA and Plavix 2-3 days per week.  At the time of this encounter, she denies any current pain.  While admitted, cyclic troponin values have been negative thus far. WBC 9.2. Hgb 11.8. Platelets 187. No significant electrylyte abnormalities. Creatinine stable at 1.35. EKG shows NSR, HR 89, nonspecific T-wave abnormality in V1 and V2 with LVH.  Her initial ischemic evaluation occurred in 2005 which was a cardiac catheterization showing nonobstructive CAD. She had a nuclear stress  test in 2012 whichg showed no evidence of ischemia. In 11/2014, she had a mildly elevated troponin of 0.04 but due to her symptoms concerning for unstable angina, a cardiac catheterization was performed which showed 85-90% stenosis of the mid-LAD, 30% stenosis of the LCx, and no significant disease in the RCA. Due to her CKD, a staged PCI procedure was performed 3 days later with a DES placed to the mid-LAD.   She was recently seen in the office by Christell Faith, PA-C on 03/14/2015 and reported having 3 syncopal events. She denied any associated symptoms. She was instructed to wear a 30-day cardiac event monitor. An echocardiogram was repeated and showed normal LV function with an EF of 50-55%. Carotid dopplers were also performed and showed 1-39% stenosis bilaterally.   Past Medical History   Past Medical History  Diagnosis Date  . Colon polyp   . GERD (gastroesophageal reflux disease)   . CAD (coronary artery disease)     a. cath 08/09/2003: mild LAD plaque, no obs dzs noted; b. 12/18/10 stress test w/o ischemia; c. cath 11/2014: LM: nl, mid 85-90% s/p PCI/DES, mid LCX 30%, RCA nl  . CVA (cerebral vascular accident) ([place])     a. 06/2008-TEE nl LV fxn; b. 01/2013 - notes indicate short run of SVT at that time  . Multiple sclerosis ([place]) 2015  . Syncope   . Chronic systolic CHF (congestive heart failure) ([place])     a. echo 01/29/2013 EF 40-45%, basal anteroseptal & mid anteroseptal segments abnl; b echo 05/2012 EF 50-55%, mildly dilated LA, nl RVSP  .  Orthostatic hypotension   . Aortic arch aneurysm ([place])   . Ischemic cardiomyopathy   . MS (mitral stenosis)   . Malignant melanoma of skin of scalp ([place])   . Hypertension   . Depression   . Diabetes mellitus without complication ([place])   . Headache     Migrains  . Hyperlipidemia   . CKD (chronic kidney disease), stage IV ([place])   . Hypothyroidism   . Anxiety   . Gastric ulcer 04/27/2011  . Heart attack [facility])     Past Surgical History  Procedure  Laterality Date  . Appendectomy    . Right oophorectomy    . Tubal ligation      R tube removed still w/ Left  . Dilation and curettage of uterus    . Gastric bypass  09/2009    [facility]   . Esophagogastroduodenoscopy (egd) with propofol N/A 09/14/2014    Procedure: ESOPHAGOGASTRODUODENOSCOPY (EGD) WITH PROPOFOL;  Surgeon: Josefine Class, MD;  Location: [facility] ENDOSCOPY;  Service: Endoscopy;  Laterality: N/A;  . Cardiac catheterization N/A 11/09/2014    Procedure: Coronary Angiography;  Surgeon: Minna Merritts, MD;  Location: [place] CV LAB;  Service: Cardiovascular;  Laterality: N/A;  . Cardiac catheterization N/A 11/12/2014    Procedure: Coronary Stent Intervention;  Surgeon: Isaias Cowman, MD;  Location: [place] [facility] LAB;  Service: Cardiovascular;  Laterality: N/A;  . Trigger finger release    . Melanoma excision  2016    Dr. Evorn Gong     Allergies  No Known Allergies  Inpatient Medications    . aspirin EC  81 mg Oral Daily  . carvedilol  25 mg Oral BID  . citalopram  40 mg Oral Daily  . clopidogrel  75 mg Oral Daily  . diltiazem  120 mg Oral Daily  . heparin  5,000 Units Subcutaneous 3 times per day  . isosorbide mononitrate  30 mg Oral Daily  . levothyroxine  25 mcg Oral Q0600  . losartan  25 mg Oral Daily  . pantoprazole  40 mg Oral BID  . pregabalin  100 mg Oral BID  . rosuvastatin  20 mg Oral q1800  . traZODone  200 mg Oral QHS  . [START ON 04/20/2015] Vitamin D (Ergocalciferol)  50,000 Units Oral Q7 days    Family History    Family History  Problem Relation Age of Onset  . Hypertension Mother   . Anxiety disorder Mother   . Depression Mother   . Bipolar disorder Mother   . Heart disease Mother   . Hyperlipidemia Mother   . Kidney disease Father   . Heart disease Father   . Hypertension Father   . Diabetes Father   . Stroke Father   . Colon cancer Father     dx in his 47's  . Anxiety disorder Father   . Depression  Father   . Skin cancer Father   . Kidney disease Sister   . Thyroid nodules Sister   . Hypertension Sister   . Hypertension Sister   . Diabetes Sister   . Hyperlipidemia Sister   . Depression Sister     Social History    Social History   Social History  . Marital Status: Married    Spouse Name: N/A  . Number of Children: 1  . Years of Education: N/A   Occupational History  . 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 Disabled through 08/02/2013 per forms 02/03/2013   Social History Main Topics  . Smoking status: Former Smoker    Types: Cigarettes    Quit date: 08/31/1994  . Smokeless tobacco: Never Used     Comment: quit 28 years ago  . Alcohol Use: No  . Drug Use: No  . Sexual Activity: Not Currently    Birth Control/ Protection: None   Other Topics Concern  . Not on file   Social History Narrative   previously did custolial work. 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 Disabled through 08/02/2013 per forms 02/03/13     Review of Systems    General:  No chills, fever, night sweats or weight changes.  Cardiovascular:  No edema, orthopnea, palpitations, paroxysmal nocturnal dyspnea. Positive for chest pain, dyspnea on exertion, and diaphoresis. Dermatological: No rash, lesions/masses Respiratory: No cough, Positive for dyspnea. Urologic: No hematuria, dysuria Abdominal:   No nausea, vomiting, diarrhea, bright red blood per rectum, melena, or hematemesis Neurologic:  No visual changes, wkns, changes in mental status. Positive for numbness down left arm. All other systems reviewed and are otherwise negative except as noted above.  Physical Exam    Blood pressure 142/69, pulse 55, temperature 98 F (36.7 C), temperature source Oral, resp. rate 18, height 5\' 3"  (1.6 m), weight 184 lb 4.8 oz (83.598 kg), SpO2 94 %.  General: Pleasant, [name] appearing in NAD Psych:  Normal affect. Neuro: Alert and oriented X 3. Moves all extremities spontaneously. HEENT: Normal  Neck: Supple without bruits or JVD. Lungs:  Resp regular and unlabored, CTA without wheezing or rales. Heart: RRR no s3, s4, or murmurs. Abdomen: Soft, non-tender, non-distended, BS + x 4.  Extremities: No clubbing, cyanosis or edema. DP/PT/Radials 2+ and equal bilaterally.  Labs    Troponin (Point of Care Test) No results for input(s): TROPIPOC in the last 72 hours.  Recent Labs  04/17/15 1729 04/17/15 2104 04/18/15 0016 04/18/15 0504  TROPONINI <0.03 <0.03 <0.03 <0.03   Lab Results  Component Value Date   WBC 7.5 04/18/2015   HGB 11.5* 04/18/2015   HCT 35.5 04/18/2015   MCV 73.6* 04/18/2015   PLT 190 04/18/2015    Recent Labs Lab 04/18/15 0504  NA 138  K 4.3  CL 105  CO2 27  BUN 15  CREATININE 1.33*  CALCIUM 8.8*  GLUCOSE 107*   Lab Results  Component Value Date   CHOL 164 11/09/2014   HDL 27* 11/09/2014   LDLCALC 85 11/09/2014   TRIG 259* 11/09/2014     Radiology Studies    Dg Chest 2 View: 04/17/2015  CLINICAL DATA:  Acute onset of generalized chest pain, radiating to the left arm. Initial encounter. EXAM: CHEST  2 VIEW COMPARISON:  Chest radiograph performed 02/16/2015 FINDINGS: The lungs are well-aerated. Mild peribronchial thickening is noted. There is no evidence of focal opacification, pleural effusion or pneumothorax. The heart is normal in size; the mediastinal contour is within normal limits. Postoperative change is noted about the left side of the superior mediastinum. No acute osseous abnormalities are seen. IMPRESSION: Mild peribronchial thickening noted.  Lungs otherwise clear Electronically Signed   By: Garald Balding M.D.   On: 04/17/2015 18:36    EKG & Cardiac Imaging    EKG:  NSR, HR 89, nonspecific T-wave abnormality in V1 and V2 with LVH.  Echocardiogram: 03/26/2015 Study Conclusions - Left ventricle: The cavity size was mildly dilated.  There was  mild concentric hypertrophy. Systolic function was normal. The  estimated ejection fraction was in the range of 50% to 55%. Wall  motion was normal; there were no regional wall motion  abnormalities. Left ventricular diastolic function parameters  were normal. - Mitral valve: There was mild regurgitation. - Left atrium: The atrium was mildly dilated.  Cardiac Catheterization: 11/09/2015 Coronary angiography:  Coronary dominance: Right  Left mainstem: Large vessel that bifurcates into the LAD and left circumflex, no significant disease noted  Left anterior descending (LAD): Large vessel that extends around the apical region, severe mid LAD lesion estimated at 85-90%, long tubular stenosis. Mild distal LAD disease  Left circumflex (LCx): Large vessel with several obtuse marginal branches, very mild 30% proximal circumflex disease noted  Right coronary artery (RCA): Large vessel, dominant, no significant disease noted  Left ventriculography: LV gram was not performed today given her chronic kidney disease  Final Conclusions:  Single vessel disease, severe mid LAD lesion. Patient has severe renal dysfunction, chronic kidney disease. Creatinine yesterday 2.0, today 1.79. Case discussed with Dr. Fletcher Anon and Dr. Josefa Half.  Recommendation for staged procedure given her renal failure. We'll hydrate today, and overnight on Sunday night in preparation for prevention on Monday morning. Close observation of her renal function over the weekend.  Recommendations:  Staged procedure with diagnostic catheterization today, hydration today, close monitoring of her renal function, PCI of her mid LAD lesion scheduled for Monday morning, would give fluids starting Sunday night in preparation  Coronary Stent Intervention: 11/12/2014  Prox LAD lesion, 90% stenosed. Post intervention, there is a 0% residual stenosis. The lesion was previously treated with a stent (unknown type).  1.  Successful PCI mid LAD  Assessment & Plan    1. Unstable Angina/ History of CAD - cath in 11/2014 with 85-90% stenosis of mid-LAD, s/p PCI with DES - developed chest pressure at rest two days ago, lasting 10-15 minutes and relieved with SL NTG. She reports associated diaphoresis and dyspnea. Walking on the treadmill yesterday and developed a central chest pressure after less than 3 minutes of exercise (usually able to walk 30-45 minutes without any symptoms). Symptoms are similar to what she experienced in 11/2014 when she required a stent. - has been noncompliant with ASA and Plavix over the past several months. - cyclic troponin values have been negative thus far. EKG shows NSR, HR 89, nonspecific T-wave abnormality in V1 and V2 with LVH. - discussion held between myself, Dr. [name], and the patient. She prefers a cardiac catheterization over a stress test due to having a negative stress test in 2016 then having significant stenosis noted on her catheterization. - will plan for cardiac catheterization later today. - continue ASA, Plavix, BB, Imdur, and statin.  2. Chronic systolic CHF - recent echo shows improved EF of 50-55%. Does not appear volume overloaded on physical exam. - continue ARB and BB.  3. Stage 3 CKD - creatinine stable at 1.33. - continue to monitor.  4. Type 2 DM - per admitting team  5. HTN - BP has been 121/62 - 179/110 while admitted. Improved with resuming home medications. - continue to monitor.  6. HLD - continue statin therapy.  7. Medication Noncompliance - patient reports skipping doses of medication due to financial constraints. The importance of her ASA and Plavix in the setting of her recent stent was thoroughly reviewed with her. - will ask Case Management to see.   Signed, Erma Heritage, PA-C 04/18/2015, 7:45 AM Pager: [phone]

## 2015-04-18 NOTE — Discharge Summary (Signed)
[facility] [place] NAME: [name]    MR#:  [id number]  DATE OF BIRTH:  22-Oct-1970  DATE OF ADMISSION:  04/17/2015 ADMITTING PHYSICIAN: Vaughan Basta, MD  DATE OF DISCHARGE: 04/18/2015  PRIMARY CARE PHYSICIAN: [name], MD    ADMISSION DIAGNOSIS:  Chest pain, unspecified chest pain type [R07.9]  DISCHARGE DIAGNOSIS:  Active Problems:   Essential hypertension   Hyperlipidemia   Chronic kidney disease (CKD), stage III (moderate)   Coronary artery disease involving native coronary artery of native heart with unstable angina pectoris (HCC)   Chest pain   Non compliance w medication regimen   SECONDARY DIAGNOSIS:   Past Medical History  Diagnosis Date  . Colon polyp   . GERD (gastroesophageal reflux disease)   . CAD (coronary artery disease)     a. cath 08/09/2003: mild LAD plaque, no obs dzs noted; b. 12/18/10 stress test w/o ischemia; c. cath 11/2014: LM: nl, mid 85-90% s/p PCI/DES, mid LCX 30%, RCA nl  . CVA (cerebral vascular accident) ([place])     a. 06/2008-TEE nl LV fxn; b. 01/2013 - notes indicate short run of SVT at that time  . Multiple sclerosis ([place]) 2015  . Syncope   . Chronic systolic CHF (congestive heart failure) ([place])     a. echo 01/29/2013 EF 40-45%, basal anteroseptal & mid anteroseptal segments abnl; b echo 05/2012 EF 50-55%, mildly dilated LA, nl RVSP  . Orthostatic hypotension   . Aortic arch aneurysm ([place])   . Ischemic cardiomyopathy   . MS (mitral stenosis)   . Malignant melanoma of skin of scalp ([place])   . Hypertension   . Depression   . Diabetes mellitus without complication ([place])   . Headache     Migrains  . Hyperlipidemia   . CKD (chronic kidney disease), stage IV ([place])   . Hypothyroidism   . Anxiety   . Gastric ulcer 04/27/2011  . Heart attack [facility])     HOSPITAL COURSE:   45 year old female with past medical history significant for coronary artery disease status post  cardiac stent in October 2016, multiple sclerosis, systolic CHF, hypertension, diabetes and history of CVA and CK D presents with chest pain.  #1 unstable angina-Patient had a cardiac catheterization done on 04/18/2015 which showed 70% stenosis in mid diagonal branch. Her previous stent in the mid LAD was patent. Patient has been noncompliant with her medications. Counseled. -Continue her aspirin, Plavix. Imdur has been increased to 60 mg. Also on Coreg and statin and losartan. -Advised to follow up with cardiology next week. -Ambulate and if stable possible discharge later today.  #2 hypertension-her home medications have been continued. -Continue carvedilol, Cardizem, losartan  #3 DM- restart canagliflozin. Metformin can be restarted in 2 days due to contrast from cath today  #4 Diabetic neuropathy- restart lyrica  #5 GERD- protonix   Will be discharged home today Counseled about being compliant with meds.  DISCHARGE CONDITIONS:   Stable  CONSULTS OBTAINED:  Treatment Team:  Leonie Man, MD Minna Merritts, MD  DRUG ALLERGIES:  No Known Allergies  DISCHARGE MEDICATIONS:   Current Discharge Medication List    START taking these medications   Details  nitroGLYCERIN (NITROSTAT) 0.4 MG SL tablet Place 1 tablet (0.4 mg total) under the tongue every 5 (five) minutes as needed for chest pain. Qty: 30 tablet, Refills: 5      CONTINUE these medications which have CHANGED   Details  carvedilol (  COREG) 25 MG tablet Take 1 tablet (25 mg total) by mouth 2 (two) times daily. Qty: 60 tablet, Refills: 6    citalopram (CELEXA) 40 MG tablet Take 1 tablet (40 mg total) by mouth daily. Qty: 30 tablet, Refills: 3    clopidogrel (PLAVIX) 75 MG tablet Take 1 tablet (75 mg total) by mouth daily. Qty: 30 tablet, Refills: 5    diltiazem (CARDIZEM CD) 120 MG 24 hr capsule Take 1 capsule (120 mg total) by mouth daily. Qty: 30 capsule, Refills: 5    isosorbide mononitrate (IMDUR)  60 MG 24 hr tablet Take 1 tablet (60 mg total) by mouth daily. Qty: 30 tablet, Refills: 5    levothyroxine (SYNTHROID, LEVOTHROID) 25 MCG tablet Take 1 tablet (25 mcg total) by mouth daily before breakfast. Qty: 30 tablet, Refills: 5    losartan (COZAAR) 25 MG tablet Take 1 tablet (25 mg total) by mouth daily. Qty: 30 tablet, Refills: 5    metFORMIN (GLUCOPHAGE) 500 MG tablet Take 2 tablets (1,000 mg total) by mouth at bedtime. START FROM 04/21/15 Qty: 60 tablet, Refills: 2    pantoprazole (PROTONIX) 40 MG tablet Take 1 tablet (40 mg total) by mouth 2 (two) times daily. Qty: 60 tablet, Refills: 5    rosuvastatin (CRESTOR) 20 MG tablet Take 1 tablet (20 mg total) by mouth daily at 6 PM. Qty: 30 tablet, Refills: 5      CONTINUE these medications which have NOT CHANGED   Details  aspirin EC 81 MG tablet Take 1 tablet (81 mg total) by mouth daily.    baclofen (LIORESAL) 10 MG tablet Take 1 tablet (10 mg total) by mouth 2 (two) times daily as needed for muscle spasms. Refills: 0    canagliflozin (INVOKANA) 100 MG TABS tablet Take 1 tablet (100 mg total) by mouth daily before breakfast. Qty: 30 tablet, Refills: 5   Associated Diagnoses: Controlled type 2 diabetes mellitus with diabetic neuropathy, with long-term current use of insulin (HCC)    eszopiclone (LUNESTA) 2 MG TABS tablet Take 2 mg by mouth at bedtime as needed for sleep. Take immediately before bedtime    pregabalin (LYRICA) 100 MG capsule Take 1 capsule (100 mg total) by mouth 2 (two) times daily. Qty: 60 capsule, Refills: 5   Associated Diagnoses: Neuropathy (HCC)    promethazine (PHENERGAN) 25 MG tablet Take 25 mg by mouth every 6 (six) hours as needed for nausea or vomiting.     traZODone (DESYREL) 100 MG tablet Take 200 mg by mouth at bedtime.    Vitamin D, Ergocalciferol, (DRISDOL) 50000 units CAPS capsule Take 50,000 Units by mouth every 7 (seven) days. Pt takes on Saturday.         DISCHARGE INSTRUCTIONS:    1. Cardiology follow up in 1 week 2. PCP follow-up in 2 weeks   If you experience worsening of your admission symptoms, develop shortness of breath, life threatening emergency, suicidal or homicidal thoughts you must seek medical attention immediately by calling 911 or calling your MD immediately  if symptoms less severe.  You Must read complete instructions/literature along with all the possible adverse reactions/side effects for all the Medicines you take and that have been prescribed to you. Take any new Medicines after you have completely understood and accept all the possible adverse reactions/side effects.   Please note  You were cared for by a hospitalist during your hospital stay. If you have any questions about your discharge medications or the care you received while you  were in the hospital after you are discharged, you can call the unit and asked to speak with the hospitalist on call if the hospitalist that took care of you is not available. Once you are discharged, your primary care physician will handle any further medical issues. Please note that NO REFILLS for any discharge medications will be authorized once you are discharged, as it is imperative that you return to your primary care physician (or establish a relationship with a primary care physician if you do not have one) for your aftercare needs so that they can reassess your need for medications and monitor your lab values.    Today   CHIEF COMPLAINT:   Chief Complaint  Patient presents with  . Chest Pain    VITAL SIGNS:  Blood pressure 119/64, pulse 57, temperature 98 F (36.7 C), temperature source Oral, resp. rate 12, height 5\' 3"  (1.6 m), weight 83.598 kg (184 lb 4.8 oz), SpO2 99 %.  I/O:   Intake/Output Summary (Last 24 hours) at 04/18/15 1452 Last data filed at 04/18/15 1409  Gross per 24 hour  Intake      0 ml  Output   1800 ml  Net  -1800 ml    PHYSICAL EXAMINATION:   Physical Exam  GENERAL:   45 y.o.-year-old patient lying in the bed with no acute distress.  EYES: Pupils equal, round, reactive to light and accommodation. No scleral icterus. Extraocular muscles intact.  HEENT: Head atraumatic, normocephalic. Oropharynx and nasopharynx clear.  NECK:  Supple, no jugular venous distention. No thyroid enlargement, no tenderness.  LUNGS: Normal breath sounds bilaterally, no wheezing, rales,rhonchi or crepitation. No use of accessory muscles of respiration.  CARDIOVASCULAR: S1, S2 normal. No murmurs, rubs, or gallops.  ABDOMEN: Soft, non-tender, non-distended. Bowel sounds present. No organomegaly or mass.  EXTREMITIES: No pedal edema, cyanosis, or clubbing.  Right groin site- no bleeding noted. NEUROLOGIC: Cranial nerves II through XII are intact. Muscle strength 5/5 in all extremities. Sensation intact. Gait not checked.  PSYCHIATRIC: The patient is alert and oriented x 3.  SKIN: No obvious rash, lesion, or ulcer.    DATA REVIEW:   CBC  Recent Labs Lab 04/18/15 0504  WBC 7.5  HGB 11.5*  HCT 35.5  PLT 190    Chemistries   Recent Labs Lab 04/18/15 0504  NA 138  K 4.3  CL 105  CO2 27  GLUCOSE 107*  BUN 15  CREATININE 1.33*  CALCIUM 8.8*    Cardiac Enzymes  Recent Labs Lab 04/18/15 0504  TROPONINI <0.03    Microbiology Results  Results for orders placed or performed in visit on 04/05/15  Urine Culture     Status: Abnormal   Collection Time: 04/05/15 11:45 AM  Result Value Ref Range Status   Urine Culture, Routine Final report (A)  Final   Urine Culture result 1 Klebsiella pneumoniae (A)  Final    Comment: Greater than 100,000 colony forming units per mL   ANTIMICROBIAL SUSCEPTIBILITY Comment  Final    Comment:       ** S = Susceptible; I = Intermediate; R = Resistant **                    P = Positive; N = Negative             MICS are expressed in micrograms per mL    Antibiotic  RSLT#1    RSLT#2    RSLT#3     RSLT#4 Amoxicillin/Clavulanic Acid    S Ampicillin                     R Cefepime                       S Ceftriaxone                    S Cefuroxime                     S Cephalothin                    S Ciprofloxacin                  S Ertapenem                      S Gentamicin                     S Imipenem                       S Levofloxacin                   S Nitrofurantoin                 S Piperacillin                   S Tetracycline                   S Tobramycin                     S Trimethoprim/Sulfa             S     RADIOLOGY:  Dg Chest 2 View  04/17/2015  CLINICAL DATA:  Acute onset of generalized chest pain, radiating to the left arm. Initial encounter. EXAM: CHEST  2 VIEW COMPARISON:  Chest radiograph performed 02/16/2015 FINDINGS: The lungs are well-aerated. Mild peribronchial thickening is noted. There is no evidence of focal opacification, pleural effusion or pneumothorax. The heart is normal in size; the mediastinal contour is within normal limits. Postoperative change is noted about the left side of the superior mediastinum. No acute osseous abnormalities are seen. IMPRESSION: Mild peribronchial thickening noted.  Lungs otherwise clear Electronically Signed   By: Garald Balding M.D.   On: 04/17/2015 18:36    EKG:   Orders placed or performed during the hospital encounter of 04/17/15  . EKG 12-Lead  . EKG 12-Lead  . ED EKG within 10 minutes  . ED EKG within 10 minutes      Management plans discussed with the patient, family and they are in agreement.  CODE STATUS:     Code Status Orders        Start     Ordered   04/17/15 2200  Full code   Continuous     04/17/15 2159    Code Status History    Date Active Date Inactive Code Status Order ID Comments User Context   02/15/2015  8:04 PM 02/20/2015  7:03 PM Full Code [id number]  Gonzella Lex, MD Inpatient   11/12/2014  8:33 AM 11/13/2014  2:49 PM Full Code [id number]  Isaias Cowman, MD  Inpatient  11/07/2014  9:53 PM 11/12/2014  8:33 AM Full Code [id number]  Henreitta Leber, MD Inpatient      TOTAL TIME TAKING CARE OF THIS PATIENT: 39 minutes.    Gladstone Lighter M.D on 04/18/2015 at 2:52 PM  Between 7am to 6pm - Pager - [phone]  After 6pm go to [email or web address] - password [email or web address] [facility]  Office  [phone]  CC: Primary care physician; [name], MD

## 2015-04-18 NOTE — Care Management (Signed)
CM consult for medication needs. Patient has insurance but says her copays are high because she is on so many medications.  Most of her cardiac meds are on the [facility] list.  Her Lyrica copay is forty five dollars.  Discussed the cash  price for this drug is approximately 400.00.  She has tried gabapentin but it did not work and Lyrica does control her sx. Discussed web sites, individual drug company patient assist programs and Journalist, newspaper.

## 2015-04-18 NOTE — Progress Notes (Signed)
  Per Dr. [name], the patient's cardiac catheterization showed 70% stenosis in the mid-diagonal at the bifurcation. Stent in the mid-LAD was patent. Medical management was recommended. Full case report to follow.  Will titrate her Imdur from 30mg  daily to 60mg  daily. I have placed a Care Management consult in regards to her medication noncompliance secondary to financial stressors and to see if any assistance programs are available to the patient.  Signed, Erma Heritage, PA-C 04/18/2015, 12:07 PM Pager: [phone]

## 2015-04-18 NOTE — Progress Notes (Signed)
Patient tolerated procedure well, right groin WNL, pulses WNL. Tolerable pain control. Report called to floor RN, Kristine Garbe, notified that patient can sit up at 1400. Will transfer shortly.

## 2015-04-22 ENCOUNTER — Other Ambulatory Visit (INDEPENDENT_AMBULATORY_CARE_PROVIDER_SITE_OTHER): Payer: BC Managed Care – PPO

## 2015-04-22 ENCOUNTER — Other Ambulatory Visit: Payer: Self-pay

## 2015-04-22 ENCOUNTER — Telehealth: Payer: Self-pay | Admitting: Family Medicine

## 2015-04-22 ENCOUNTER — Encounter: Payer: Self-pay | Admitting: Cardiovascular Disease

## 2015-04-22 DIAGNOSIS — N3000 Acute cystitis without hematuria: Secondary | ICD-10-CM

## 2015-04-22 LAB — POCT URINALYSIS DIPSTICK
BILIRUBIN UA: NEGATIVE
GLUCOSE UA: 2000
Ketones, UA: NEGATIVE
NITRITE UA: NEGATIVE
PH UA: 6
SPEC GRAV UA: 1.01
Urobilinogen, UA: 0.2

## 2015-04-22 NOTE — Telephone Encounter (Signed)
Pt stated that the cefdinir (OMNICEF) 300 MG capsule hasn't helped her UTI and is having a lot of back pain. [facility]. Please advise. Thanks TNP

## 2015-04-22 NOTE — Telephone Encounter (Signed)
We will need another urine sample for u/a and to send for culture if positive so we know what antibiotics it is sensitive to

## 2015-04-22 NOTE — Telephone Encounter (Signed)
Advised patient as below. Patient will come in and drop off a specimen.

## 2015-04-25 ENCOUNTER — Telehealth: Payer: Self-pay | Admitting: Family Medicine

## 2015-04-25 ENCOUNTER — Encounter: Payer: Self-pay | Admitting: Cardiovascular Disease

## 2015-04-25 NOTE — Telephone Encounter (Signed)
Please advise results? 

## 2015-04-25 NOTE — Telephone Encounter (Signed)
Pt wants to know if we have gotten her test results back for her ua  Her call back is  [phone]  Thanks Con Memos

## 2015-04-25 NOTE — Telephone Encounter (Signed)
Urine culture was ordered 04-22-15. Please check with [facility] to see what the status is. Thanks.

## 2015-04-26 ENCOUNTER — Other Ambulatory Visit: Payer: Self-pay | Admitting: Family Medicine

## 2015-04-26 ENCOUNTER — Other Ambulatory Visit: Payer: Self-pay | Admitting: Emergency Medicine

## 2015-04-26 DIAGNOSIS — N3000 Acute cystitis without hematuria: Secondary | ICD-10-CM

## 2015-04-26 MED ORDER — CIPROFLOXACIN HCL 500 MG PO TABS: 500.0000 mg | ORAL_TABLET | Freq: Two times a day (BID) | ORAL | Status: DC

## 2015-04-26 NOTE — Telephone Encounter (Signed)
Results are being faxed.

## 2015-04-26 NOTE — Telephone Encounter (Signed)
Tried to call pt. No answer, will try again later. I have pulled down medication, need to verify pharmacy.

## 2015-04-26 NOTE — Telephone Encounter (Signed)
Please advise patient final results of urine culture will probably not be done today. Recommend she go ahead and start cipro 500mg  twice a day for 7 days.

## 2015-04-26 NOTE — Telephone Encounter (Signed)
Cultures are not complete. They show bacteria, but have not yet identified the type of bacteria or antibiotic sensitivities. Final report should be back later today.

## 2015-04-26 NOTE — Telephone Encounter (Signed)
Med sent to pharmacy. Pt informed.  

## 2015-04-26 NOTE — Telephone Encounter (Signed)
Patient was notified.

## 2015-04-30 ENCOUNTER — Other Ambulatory Visit: Payer: Self-pay

## 2015-04-30 ENCOUNTER — Encounter: Payer: Self-pay | Admitting: Cardiovascular Disease

## 2015-04-30 ENCOUNTER — Ambulatory Visit (INDEPENDENT_AMBULATORY_CARE_PROVIDER_SITE_OTHER): Payer: BC Managed Care – PPO | Admitting: Cardiovascular Disease

## 2015-04-30 VITALS — BP 138/80 | HR 73 | Ht 63.0 in | Wt 190.8 lb

## 2015-04-30 DIAGNOSIS — I6529 Occlusion and stenosis of unspecified carotid artery: Secondary | ICD-10-CM | POA: Insufficient documentation

## 2015-04-30 DIAGNOSIS — I5022 Chronic systolic (congestive) heart failure: Secondary | ICD-10-CM | POA: Diagnosis not present

## 2015-04-30 DIAGNOSIS — I6523 Occlusion and stenosis of bilateral carotid arteries: Secondary | ICD-10-CM

## 2015-04-30 DIAGNOSIS — N183 Chronic kidney disease, stage 3 unspecified: Secondary | ICD-10-CM

## 2015-04-30 DIAGNOSIS — R079 Chest pain, unspecified: Secondary | ICD-10-CM

## 2015-04-30 DIAGNOSIS — I10 Essential (primary) hypertension: Secondary | ICD-10-CM | POA: Diagnosis not present

## 2015-04-30 DIAGNOSIS — I25110 Atherosclerotic heart disease of native coronary artery with unstable angina pectoris: Secondary | ICD-10-CM | POA: Diagnosis not present

## 2015-04-30 MED ORDER — ISOSORBIDE MONONITRATE ER 60 MG PO TB24
60.0000 mg | ORAL_TABLET | Freq: Every day | ORAL | Status: DC
Start: 2015-04-30 — End: 2015-05-31

## 2015-04-30 MED ORDER — ROSUVASTATIN CALCIUM 40 MG PO TABS: 40.0000 mg | ORAL_TABLET | Freq: Every day | ORAL | Status: DC

## 2015-04-30 MED ORDER — NITROGLYCERIN 0.4 MG SL SUBL: 0.4000 mg | SUBLINGUAL_TABLET | SUBLINGUAL | Status: DC | PRN

## 2015-04-30 NOTE — Patient Instructions (Signed)
You are doing well.  Please increase the crestor up to 40 mg daily  Please call us if you have new issues that need to be addressed before your next appt.  Your physician wants you to follow-up in: 6 months.  You will receive a reminder letter in the mail two months in advance. If you don't receive a letter, please call our office to schedule the follow-up appointment.

## 2015-04-30 NOTE — Assessment & Plan Note (Signed)
Blood pressure is well controlled on today's visit. No changes made to the medications. 

## 2015-04-30 NOTE — Assessment & Plan Note (Signed)
Significant coronary atherosclerosis Recent catheterization, medical management recommended We'll increase Crestor up to 40 mg daily to achieve LDL less than 70 Nitroglycerin as needed for chest pain

## 2015-04-30 NOTE — Progress Notes (Signed)
Patient ID: [name], female    DOB: 1971-01-04, 45 y.o.   MRN: [id number]  HPI Comments: 45 year old woman with a long hx of smoking, numerous prior strokes,  history of chest pain dating back to 2005 at that time with cardiac catheterization on August 09, 2003 found to have mild plaque in the LAD, but there was no obstructive disease noted.   MUGA, which showed an ejection fraction of 51%.   Transesophageal echocardiogram in the setting of a CVA in May 2010  showed normal LV function, s/p gastric bypass surgery, Who presents for routine followup of her coronary artery disease  Cardiac catheterization 2 weeks ago showing moderate LAD disease, 70% diagonal disease not amenable to intervention as it is a small vessel, near the ostium.   In follow-up today, she feels relatively well, denies having significant chest pain, takes nitroglycerin periodically Planning on traveling to [place] with her husband Reports blood pressure has been relatively well-controlled Denies having periods of tachycardia as she had in the past  Recent carotid ultrasound reviewed with her showing minimal bilateral carotid disease Recent echocardiogram reviewed with her showing essentially normal ejection fraction  EKG on today's visit shows normal sinus rhythm with rate 73 bpm, no significant ST or T-wave changes  Other past medical history reviewed  She is status post left carotid to subclavian artery bypass with subclavian artery ligation, done at [facility] by vascular surgery admission to [facility] from 10/5 to 11/13/14 with unstable angina and was found to have severe mid LAD lesion at 85-90% stenosis on diagnositc cardiac cath s/p PCI/DES on staged procedure.   CT in October 2015 showing Right aortic arch with a large retro- esophageal vascular segment, persistent dorsal aortic segment) giving off aberrant left subclavian artery. This large caliber retroaortic segment measuring 18 mm compared to the ascending aorta  which measures 22 mm. This large retroesophageal aortic segment along with the partially calcified atretic ductus ligament between the left aberrant subclavian artery and the left pulmonary artery creates an ALMOSTcomplete vascular ring. This is likelythe cause ofpatient's dysphagia [place].  Previously was taking midodrine or Florinef for hypotension following her stroke.  She previously presented to the hospital December 18, 2010 with chest pain.   stress test in the hospital and showed no ischemia. Cardiac enzymes were negative. No EKG changes noted.   admission to the hospital 11/01/2012 with discharge 11/02/2012 with chest pain. She ruled out with negative cardiac enzymes. Blood pressures were in the [id number] systolic range. Discharge blood pressure medications included amlodipine 10 mg daily, benazepril 40 mg daily, clonidine 0.2 mg., Coreg 3.125 mg twice a day  stroke and admission to the hospital at the end of December 2014. She had acute onset left sided weakness including arm and leg. She reports that review of MRI showed acute stroke, as well as previous strokes. She was seen by neurology and aspirin changed to Plavix 75 mg daily. Hemoglobin A1c 8.5. Notes indicate short run of SVT while she was in the hospital and on the monitor.  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  Echocardiogram 01/29/2013 suggest ejection fraction 40-45% CT scan of the head showing small vessel disease MRI showing acute infarct right parietal white matter, extensive chronic microvascular ischemic changes  Echocardiogram April 2014 showed ejection fraction 50-55%, otherwise normal study Stress test 11/02/2012 showing no ischemia,   No Known Allergies  Outpatient Encounter Prescriptions as of 04/30/2015  Medication Sig  . aspirin EC 81 MG tablet Take 1 tablet (81 mg total) by mouth daily.  . baclofen (LIORESAL)  10 MG tablet Take 1 tablet (10 mg total) by mouth 2 (two) times daily as needed for muscle spasms.  . canagliflozin (INVOKANA) 100 MG TABS tablet Take 1 tablet (100 mg total) by mouth daily before breakfast.  . carvedilol (COREG) 25 MG tablet Take 1 tablet (25 mg total) by mouth 2 (two) times daily.  . ciprofloxacin (CIPRO) 500 MG tablet Take 1 tablet (500 mg total) by mouth 2 (two) times daily.  . citalopram (CELEXA) 40 MG tablet Take 1 tablet (40 mg total) by mouth daily.  . clopidogrel (PLAVIX) 75 MG tablet Take 1 tablet (75 mg total) by mouth daily.  Marland Kitchen diltiazem (CARDIZEM CD) 120 MG 24 hr capsule Take 1 capsule (120 mg total) by mouth daily.  . eszopiclone (LUNESTA) 2 MG TABS tablet Take 2 mg by mouth at bedtime as needed for sleep. Take immediately before bedtime  . isosorbide mononitrate (IMDUR) 60 MG 24 hr tablet Take 1 tablet (60 mg total) by mouth daily.  Marland Kitchen levothyroxine (SYNTHROID, LEVOTHROID) 25 MCG tablet Take 1 tablet (25 mcg total) by mouth daily before breakfast.  . losartan (COZAAR) 25 MG tablet Take 1 tablet (25 mg total) by mouth daily.  . metFORMIN (GLUCOPHAGE) 500 MG tablet Take 2 tablets (1,000 mg total) by mouth at bedtime. START FROM 04/21/15  . nitroGLYCERIN (NITROSTAT) 0.4 MG SL tablet Place 1 tablet (0.4 mg total) under the tongue every 5 (five) minutes as needed for chest pain.  . pantoprazole (PROTONIX) 40 MG tablet Take 1 tablet (40 mg total) by mouth 2 (two) times daily.  . pregabalin (LYRICA) 100 MG capsule Take 1 capsule (100 mg total) by mouth 2 (two) times daily.  . promethazine (PHENERGAN) 25 MG tablet Take 25 mg by mouth every 6 (six) hours as needed for nausea or vomiting.   . rosuvastatin (CRESTOR) 40 MG tablet Take 1 tablet (40 mg total) by mouth daily at 6 PM.  . traZODone (DESYREL) 100 MG tablet Take 200 mg by mouth at bedtime.  . Vitamin D, Ergocalciferol, (DRISDOL) 50000 units CAPS capsule Take 50,000 Units by mouth every 7 (seven) days. Pt takes on  Saturday.  . [DISCONTINUED] isosorbide mononitrate (IMDUR) 60 MG 24 hr tablet Take 1 tablet (60 mg total) by mouth daily.  . [DISCONTINUED] nitroGLYCERIN (NITROSTAT) 0.4 MG SL tablet Place 1 tablet (0.4 mg total) under the tongue every 5 (five) minutes as needed for chest pain.  . [DISCONTINUED] rosuvastatin (CRESTOR) 20 MG tablet Take 1 tablet (20 mg total) by mouth daily at 6 PM.   No facility-administered encounter medications on file as of 04/30/2015.    Past Medical History  Diagnosis Date  . Colon polyp   . GERD (gastroesophageal reflux disease)   . CAD (coronary artery disease)     a. cath 08/09/2003: mild LAD plaque, no obs dzs noted; b. 12/18/10 stress test w/o ischemia; c. cath 11/2014: LM: nl, mid 85-90% s/p PCI/DES, mid LCX 30%, RCA nl d. cath 04/2015: 70% stenosis mid-diagonal at bifurcation. patent stent in mid-LAD. --> Medical management recommended  . CVA (cerebral vascular accident) ([place])     a. 06/2008-TEE nl LV fxn; b. 01/2013 - notes indicate short run of SVT at that time  . Multiple sclerosis ([place]) 2015  . Syncope   . Chronic systolic CHF (congestive heart failure) ([place])     a. echo  01/29/2013 EF 40-45%, basal anteroseptal & mid anteroseptal segments abnl; b echo 05/2012 EF 50-55%, mildly dilated LA, nl RVSP  . Orthostatic hypotension   . 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 Ischemic cardiomyopathy   . MS (mitral stenosis)   . Malignant melanoma of skin of scalp ([place])   . Hypertension   . Depression   . Diabetes mellitus without complication ([place])   . Headache     Migrains  . Hyperlipidemia   . CKD (chronic kidney disease), stage IV ([place])   . Hypothyroidism   . Anxiety   . Gastric ulcer 04/27/2011    Past Surgical History  Procedure Laterality Date  . Appendectomy    . Right oophorectomy    . Tubal ligation      R tube removed still w/ Left  . Dilation and curettage of uterus    . Gastric bypass  09/2009    [facility]   .  Esophagogastroduodenoscopy (egd) with propofol N/A 09/14/2014    Procedure: ESOPHAGOGASTRODUODENOSCOPY (EGD) WITH PROPOFOL;  Surgeon: Josefine Class, MD;  Location: [facility] ENDOSCOPY;  Service: Endoscopy;  Laterality: N/A;  . Cardiac catheterization N/A 11/09/2014    Procedure: Coronary Angiography;  Surgeon: Minna Merritts, MD;  Location: [place] [facility] LAB;  Service: Cardiovascular;  Laterality: N/A;  . Cardiac catheterization N/A 11/12/2014    Procedure: Coronary Stent Intervention;  Surgeon: Isaias Cowman, MD;  Location: [place] [facility];  Service: Cardiovascular;  Laterality: N/A;  . Trigger finger release    . Melanoma excision  2016    Dr. Evorn Gong  . Cardiac catheterization N/A 04/18/2015    Procedure: Left Heart Cath and Coronary Angiography;  Surgeon: Minna Merritts, MD;  Location: [place] [facility];  Service: Cardiovascular;  Laterality: N/A;    Social History  reports that she quit smoking about 20 years ago. Her smoking use included Cigarettes. She has never used smokeless tobacco. She reports that she does not drink alcohol or use illicit drugs.  Family History family history includes Anxiety disorder in her father and mother; Bipolar disorder in her mother; Colon cancer in her father; Depression in her father, mother, and sister; Diabetes in her father and sister; Heart disease in her father and mother; Hyperlipidemia in her mother and sister; Hypertension in her father, mother, sister, and sister; Kidney disease in her father and sister; Skin cancer in her father; Stroke in her father; Thyroid nodules in her sister.    Review of Systems  Constitutional: Negative.   Respiratory: Negative.   Cardiovascular: Positive for chest pain.  Gastrointestinal: Negative.   Musculoskeletal: Negative.   Neurological: Negative.        Left arm, left leg weakness  Hematological: Negative.   All other systems reviewed and are negative.   BP 138/80 mmHg  Pulse 73  Ht  5\' 3"  (1.6 m)  Wt 190 lb 12 oz (86.524 kg)  BMI 33.80 kg/m2  LMP  (Approximate)  Physical Exam  Constitutional: She is oriented to person, place, and time. She appears well-developed and well-nourished.  HENT:  Head: Normocephalic.  Nose: Nose normal.  Mouth/Throat: Oropharynx is clear and moist.  Eyes: Conjunctivae are normal. Pupils are equal, round, and reactive to light.  Neck: Normal range of motion. Neck supple. No JVD present.  Cardiovascular: Normal rate, regular rhythm, S1 normal, S2 normal, normal heart sounds and intact distal pulses.  Exam reveals no gallop and no friction rub.   No murmur heard. Pulmonary/Chest:  Effort normal and breath sounds normal. No respiratory distress. She has no wheezes. She has no rales. She exhibits no tenderness.  Abdominal: Soft. Bowel sounds are normal. She exhibits no distension. There is no tenderness.  Musculoskeletal: Normal range of motion. She exhibits no edema or tenderness.  Lymphadenopathy:    She has no cervical adenopathy.  Neurological: She is alert and oriented to person, place, and time. Coordination normal.  Skin: Skin is warm and dry. No rash noted. No erythema.  Psychiatric: She has a normal mood and affect. Her behavior is normal. Judgment and thought content normal.    Assessment and Plan  Nursing note and vitals reviewed.

## 2015-04-30 NOTE — Assessment & Plan Note (Signed)
Mild bilateral carotid stenosis,  stressed importance of aggressive diabetes control, aggressive cholesterol control

## 2015-04-30 NOTE — Assessment & Plan Note (Signed)
Results of recent echocardiogram discussed with her showing preserved ejection fraction No changes to her medications, appears euvolemic

## 2015-04-30 NOTE — Assessment & Plan Note (Signed)
Chronic kidney disease by history, Stressed the importance of aggressive diabetes control as she is doing, avoiding nephrotoxic medications   Total encounter time more than 25 minutes  Greater than 50% was spent in counseling and coordination of care with the patient

## 2015-05-02 ENCOUNTER — Inpatient Hospital Stay: Payer: Self-pay | Admitting: Family Medicine

## 2015-05-02 IMAGING — CT CT HEAD WITHOUT CONTRAST
1 series · 16 of 28 positions shown, 20 images · non-contrast
Comparison: MRI scan of January 28, 2013; CT scan of January 28, 2013.

CLINICAL DATA: Headache, right lower extremity numbness.

EXAM:
CT HEAD WITHOUT CONTRAST
TECHNIQUE: Contiguous axial images were obtained from the base of the skull
through the vertex without intravenous contrast.

[Series 2: soft tissue · axial · 0.40mm/px · z∈[+497,+622]mm · 16 of 28 slices shown, 20 images]
[im 2/28  brain]
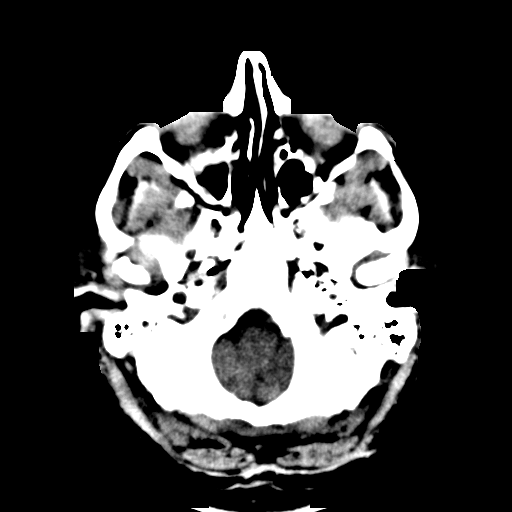
[im 2/28  bone]
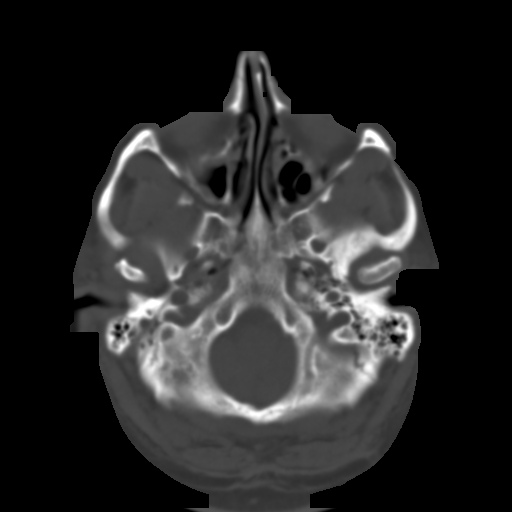
[im 4/28  brain]
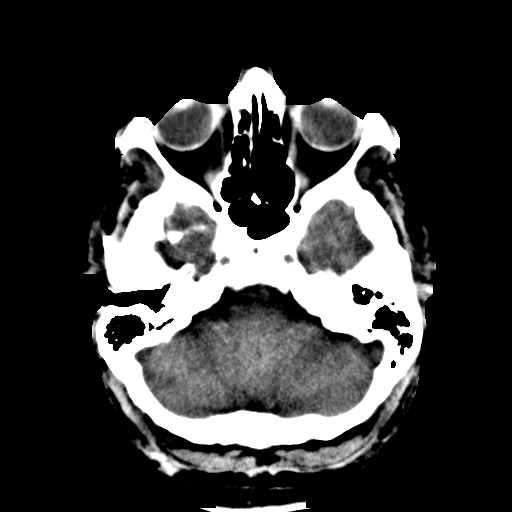
[im 6/28  brain]
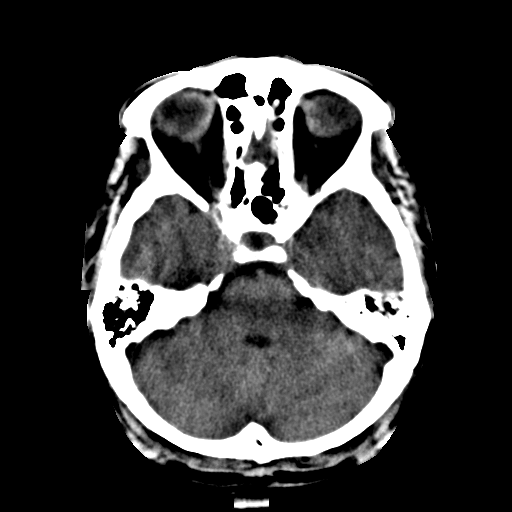
[im 7/28  brain]
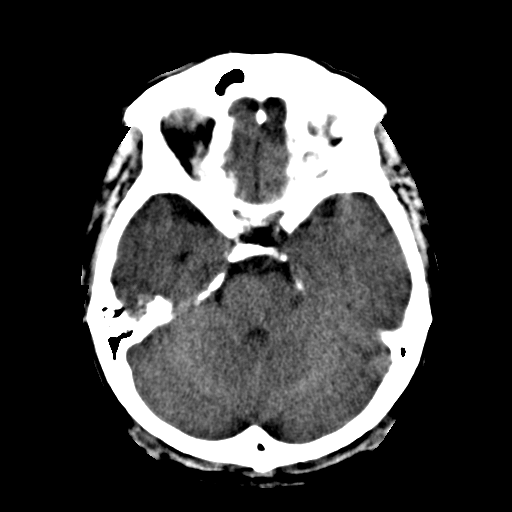
[im 9/28  brain]
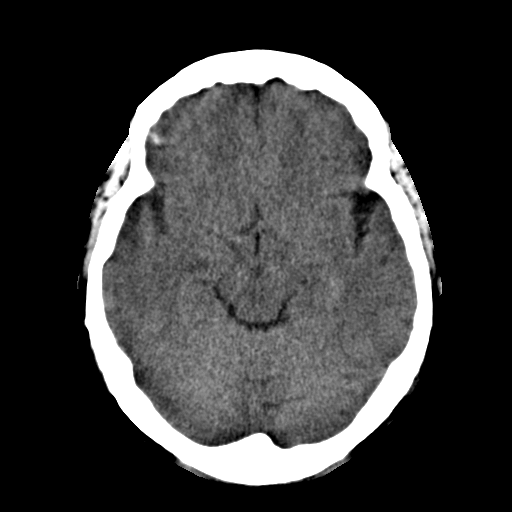
[im 9/28  bone]
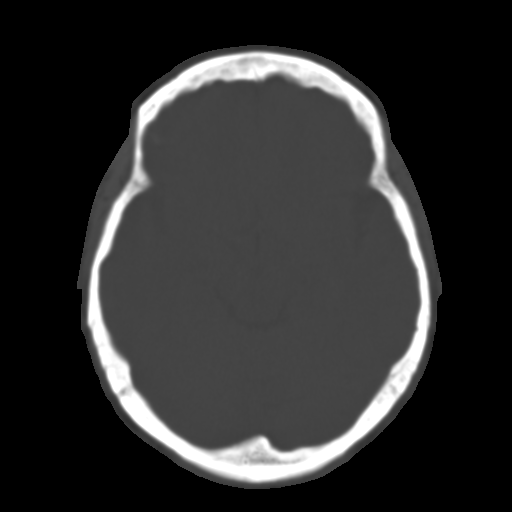
[im 10/28  brain]
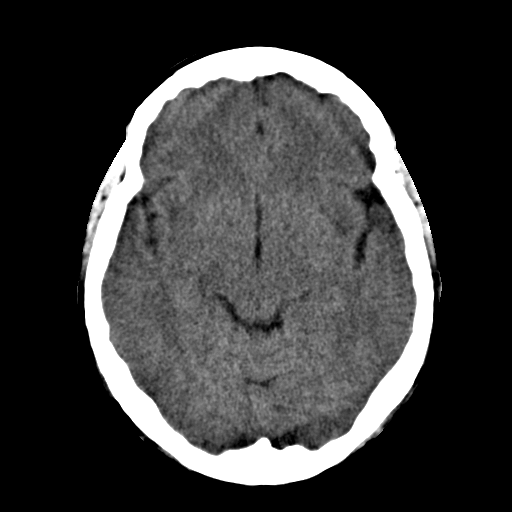
[im 12/28  brain]
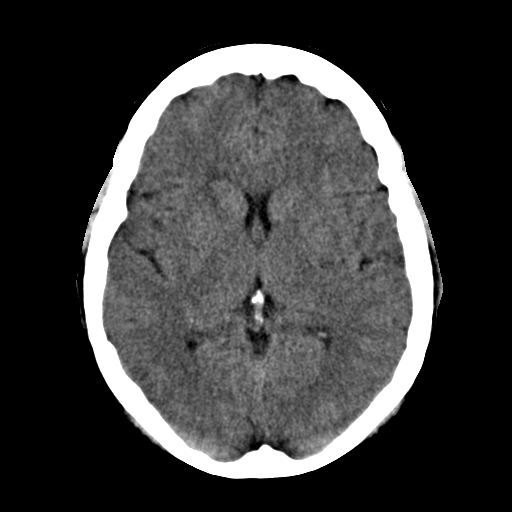
[im 14/28  brain]
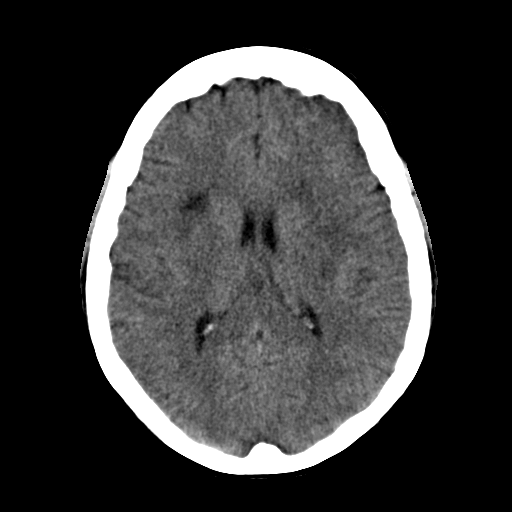
[im 15/28  brain]
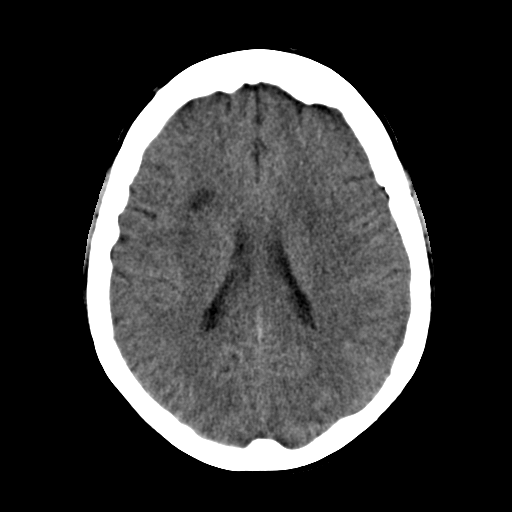
[im 15/28  bone]
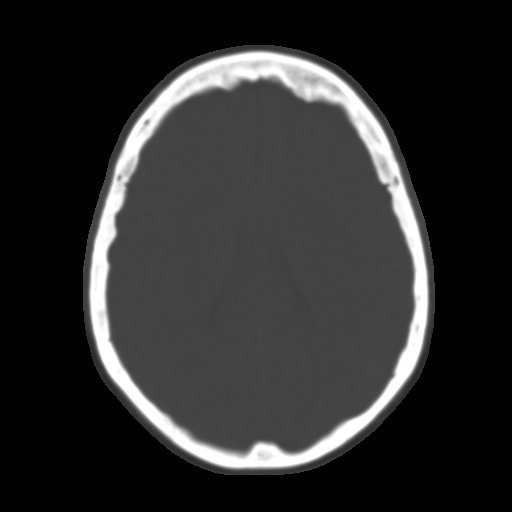
[im 17/28  brain]
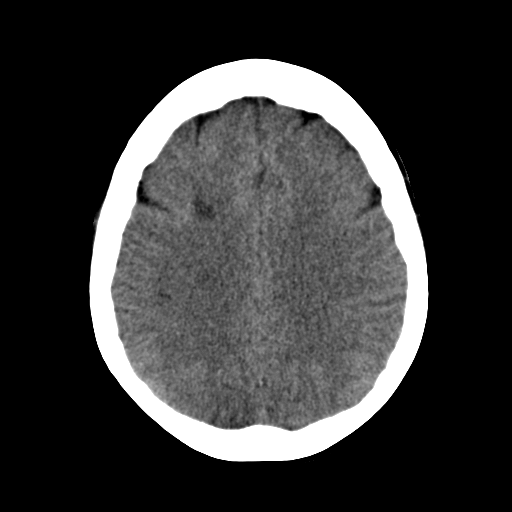
[im 19/28  brain]
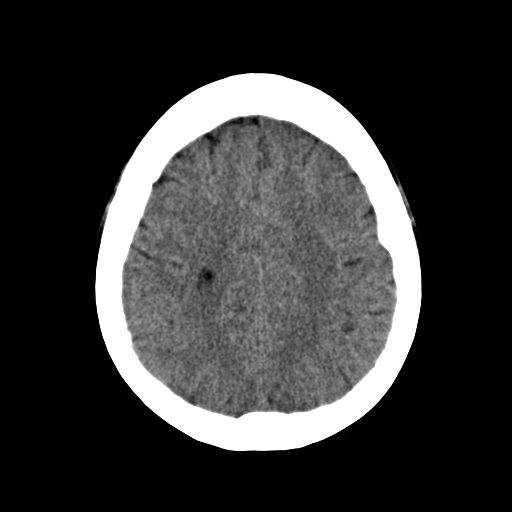
[im 20/28  brain]
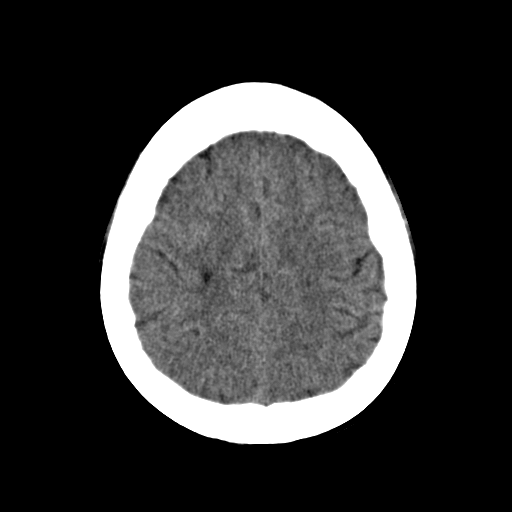
[im 22/28  brain]
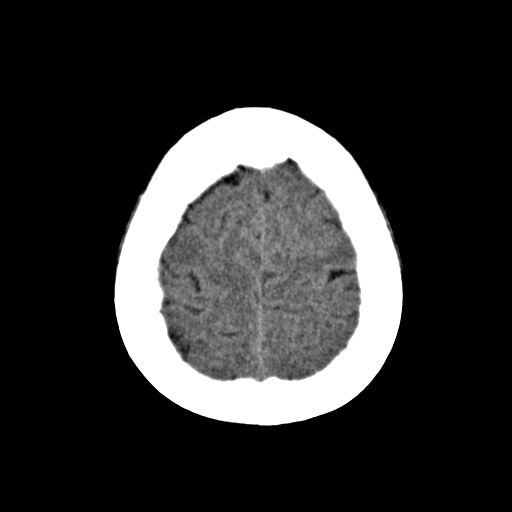
[im 22/28  bone]
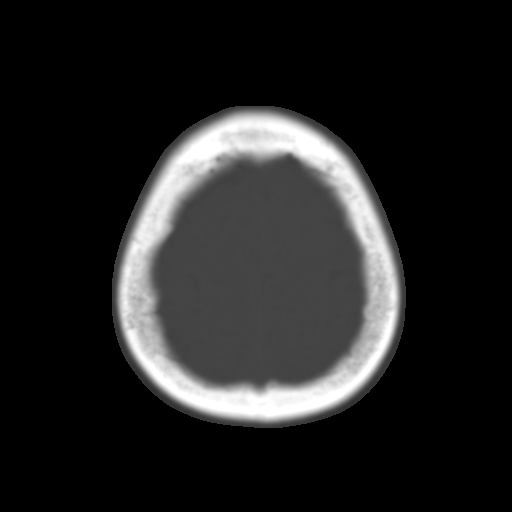
[im 23/28  brain]
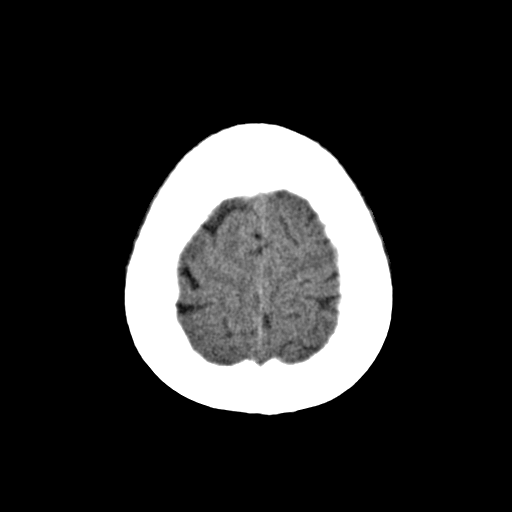
[im 25/28  brain]
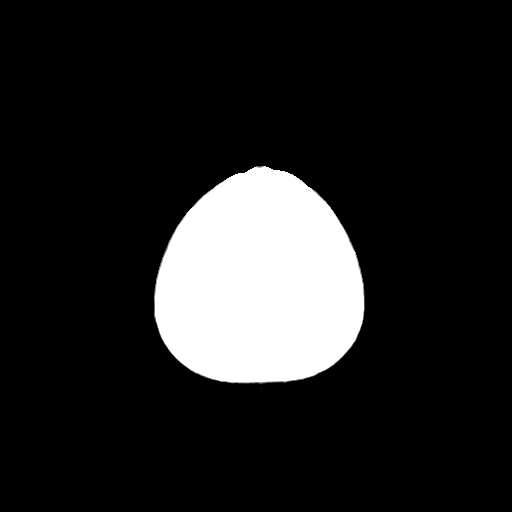
[im 27/28  brain]
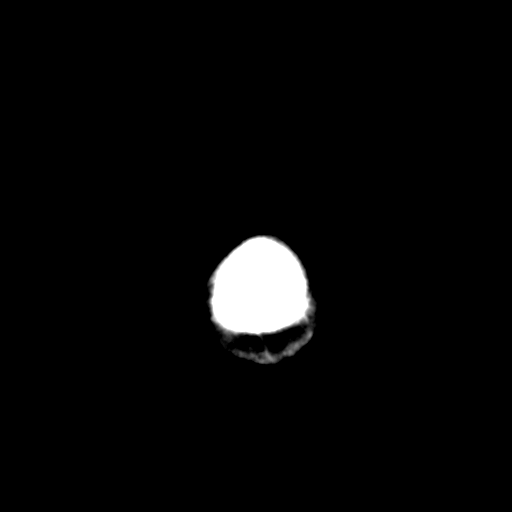

[16 of 28 positions shown; findings below may reference images not displayed]

FINDINGS: Bony calvarium appears intact. Two low-density areas are noted in
the right periventricular white matter consistent with old
infarction. No mass effect or midline shift is noted. Ventricular
size is within normal limits. There is no evidence of mass lesion,
hemorrhage or acute infarction.
IMPRESSION: Two old right periventricular white matter infarctions are noted. No
acute intracranial abnormality seen.

## 2015-05-02 IMAGING — MR MR HEAD W/O CM - MRA HEAD W/O CM
11 series · 36 of 48 positions shown · non-contrast
Comparison: Head CT without contrast 9799 hr today. Brain MRI
01/28/2013. Brain MRA 05/26/2012.

CLINICAL DATA: 42-year-old female with headache and right side
weakness plus word finding difficulty. Initial encounter.

EXAM:
MRI HEAD WITHOUT CONTRAST
MRA HEAD WITHOUT CONTRAST
TECHNIQUE: Multiplanar, multiecho pulse sequences of the brain and surrounding
structures were obtained without intravenous contrast. Angiographic
images of the head were obtained using MRA technique without
contrast.

[Series 2: T1 · sagittal · 5.0mm · 0.45mm/px · 2 of 27 slices shown (1 of 2)]
[im 1/27]
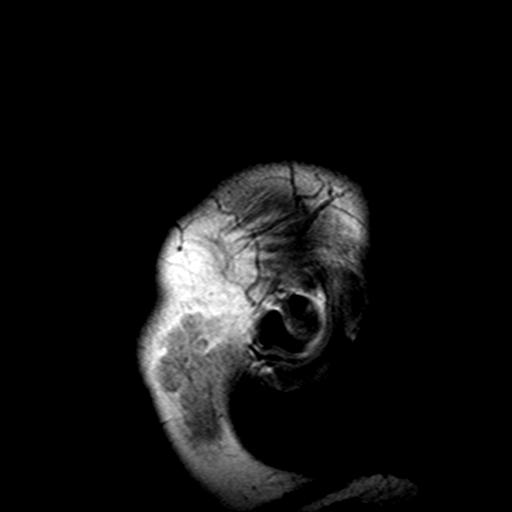
[im 27/27]
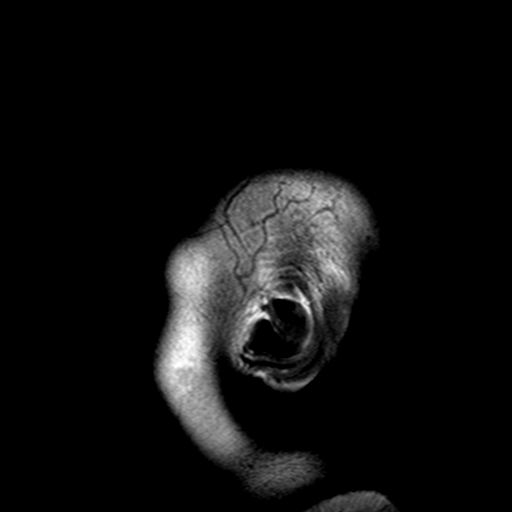

[Series 4: TOF · axial · 0.8mm · 0.39mm/px · 1 of 112 slices shown]
[im 1/112]
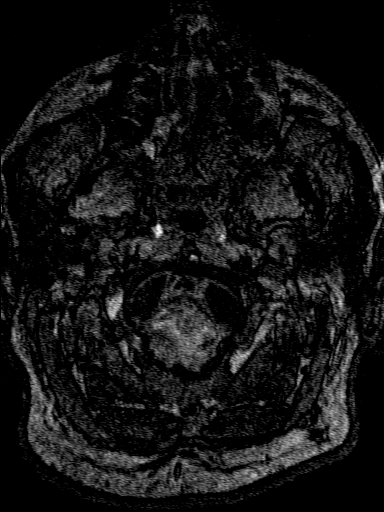

[Series 14: T2 · axial · 5.0mm · 0.43mm/px · z∈[-61,+80]mm · 3 of 24 slices shown (1 of 3)]
[im 1/24]
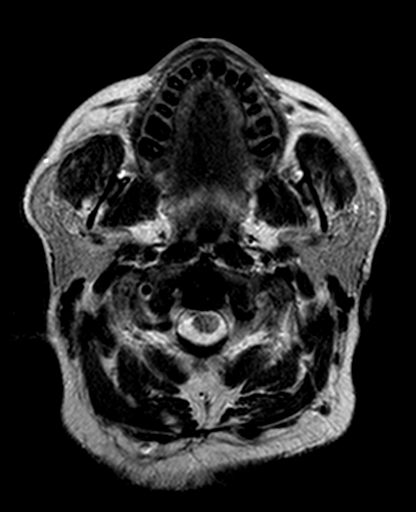
[im 12/24]
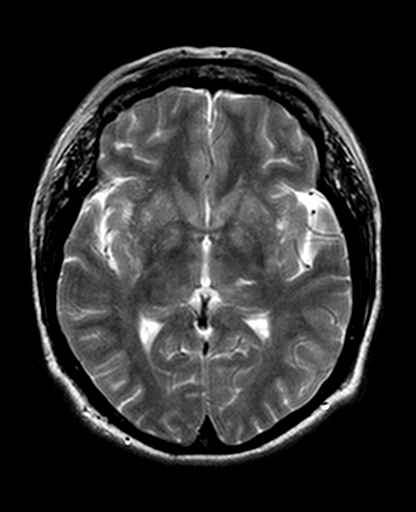
[im 24/24]
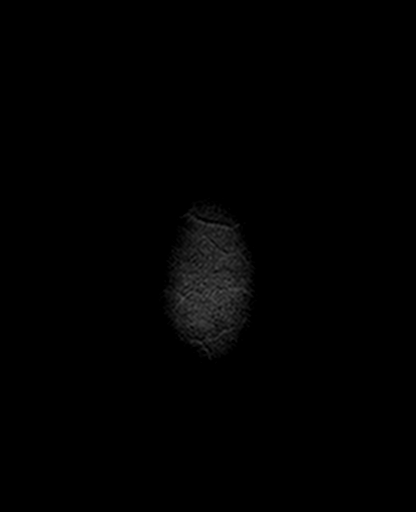

[Series 17: FLAIR · axial · 5.0mm · 0.86mm/px · z∈[-61,+81]mm · 3 of 24 slices shown]
[im 1/24]
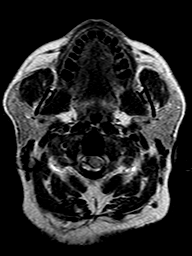
[im 12/24]
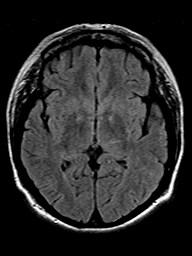
[im 24/24]
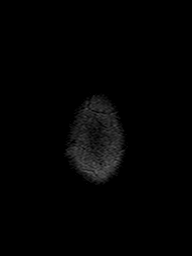

[Series 19: T2 · axial · 5.0mm · 0.43mm/px · z∈[-61,+80]mm · 3 of 24 slices shown (2 of 3)]
[im 1/24]
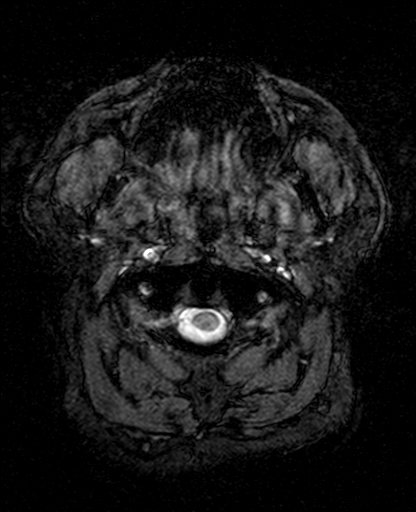
[im 12/24]
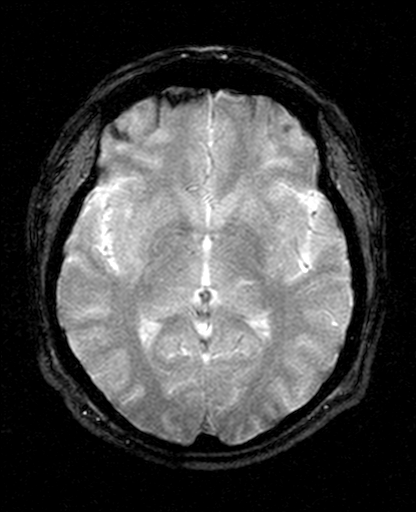
[im 24/24]
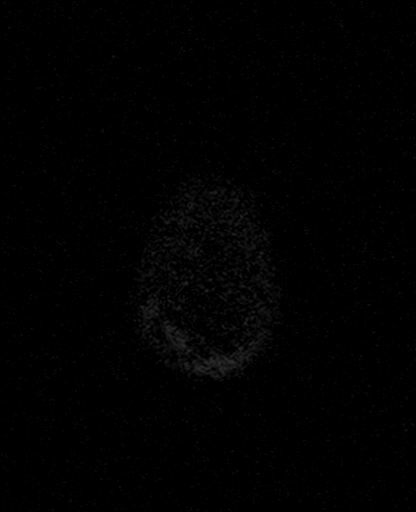

[Series 21: DWI · axial · 5.0mm · 1.80mm/px · z∈[-62,+80]mm · 3 of 24 slices shown (1 of 2)]
[im 1/24]
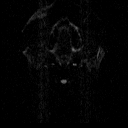
[im 12/24]
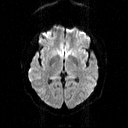
[im 24/24]
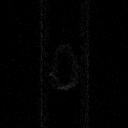

[Series 22: ADC · axial · 5.0mm · 1.80mm/px · z∈[-62,+80]mm · 3 of 24 slices shown (1 of 2)]
[im 1/24]
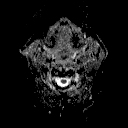
[im 12/24]
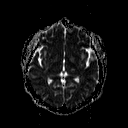
[im 24/24]
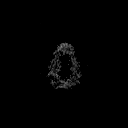

[Series 23: T1 · axial · 3.0mm · 0.45mm/px · z∈[-67,+89]mm · 7 of 56 slices shown (2 of 2)]
[im 1/56]
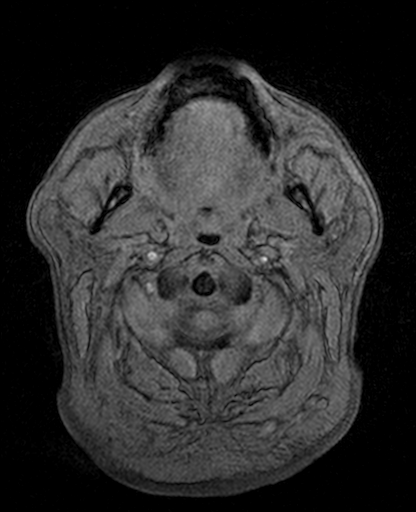
[im 10/56]
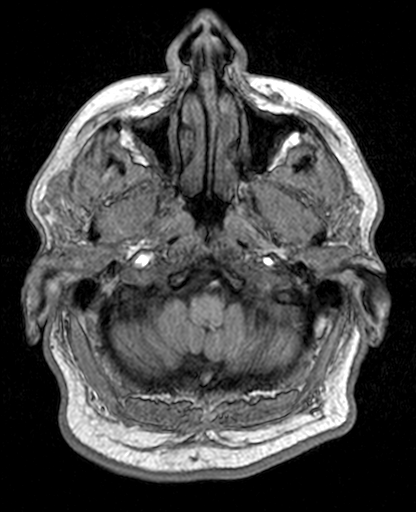
[im 19/56]
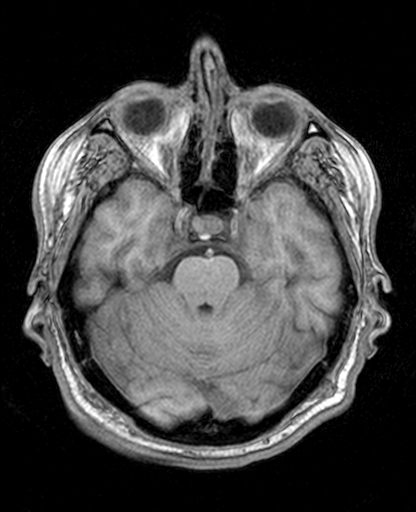
[im 28/56]
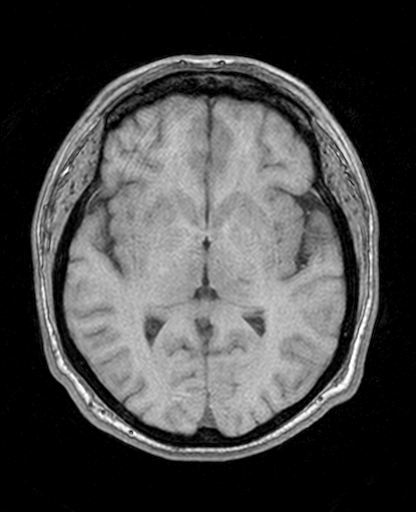
[im 37/56]
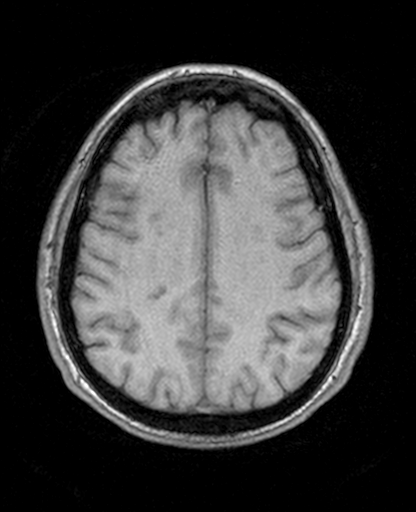
[im 46/56]
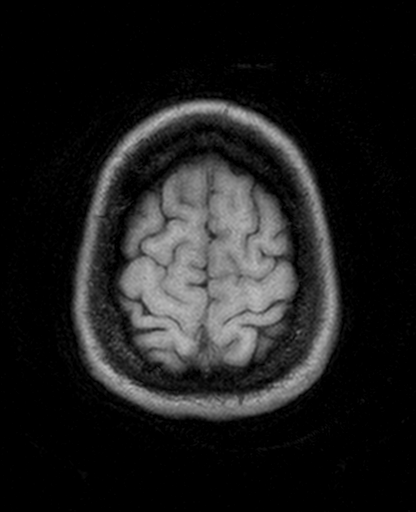
[im 56/56]
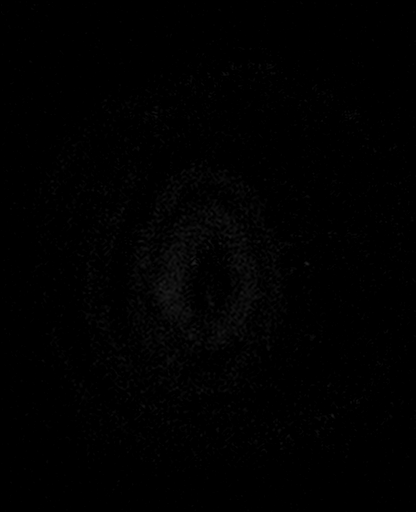

[Series 24: DWI · coronal · 5.0mm · 1.80mm/px · 4 of 33 slices shown (2 of 2)]
[im 1/33]
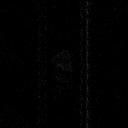
[im 11/33]
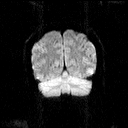
[im 22/33]
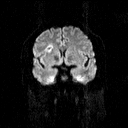
[im 33/33]
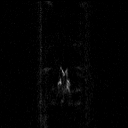

[Series 25: ADC · coronal · 5.0mm · 1.80mm/px · 4 of 35 slices shown (2 of 2)]
[im 1/35]
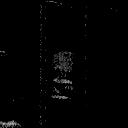
[im 12/35]
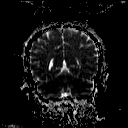
[im 23/35]
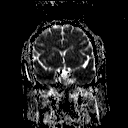
[im 35/35]
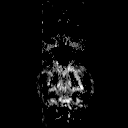

[Series 26: T2 · coronal · 5.0mm · 0.45mm/px · 3 of 27 slices shown (3 of 3)]
[im 1/27]
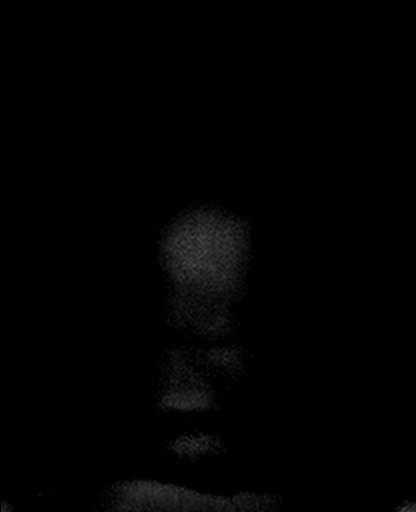
[im 14/27]
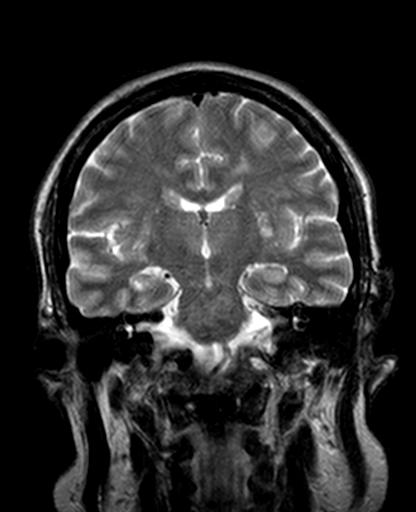
[im 27/27]
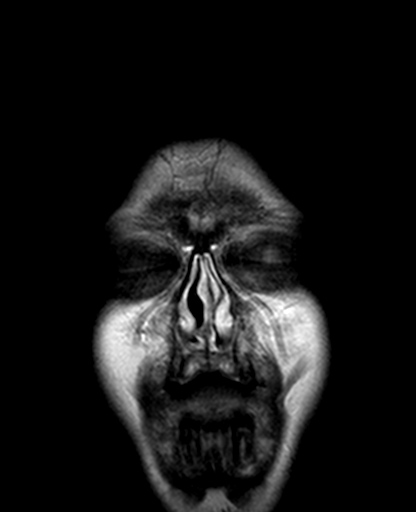

[36 of 48 positions shown; findings below may reference images not displayed]

FINDINGS: MRI HEAD FINDINGS

Chronically advanced signal changes in the cerebral white matter,
today with progressed lesions with some poor encephalic changes
since [REDACTED], demonstrating rim of increased diffusion signal
(series 14, image 17, series 17, image 17, and series 21 images
15-18).

It no restricted diffusion elsewhere. Major intracranial vascular
flow voids are stable. No hemorrhage or mass effect associated with
the above acute changes.

Left hemisphere signal abnormality is stable. No cortical
involvement identified. Chronic linear signal abnormality in the
left thalamus which more resembles chronic lacunar infarct. Other
deep gray matter nuclei are relatively spared. Stable T2
hyperintensity in the pons which is nonspecific. No cerebellar
involvement.

No midline shift, mass effect, evidence of mass lesion,
ventriculomegaly, extra-axial collection or acute intracranial
hemorrhage. Cervicomedullary junction and pituitary are within
normal limits. Grossly negative visualized cervical spine. Stable
bone marrow signal. Visible internal auditory structures appear
normal. Visualized orbit soft tissues are within normal limits. Mild
paranasal sinus mucosal thickening. Mastoids are clear. Visualized
scalp soft tissues are within normal limits.

MRA HEAD FINDINGS

Antegrade flow in the posterior circulation with dominant distal
right vertebral artery, non dominant appearing distal left vertebral
artery. Patent vertebrobasilar junction. No basilar stenosis. SCA
and AICA origins are within normal limits. Normal left PCA origin.
Stable fetal type right PCA origin. Left posterior communicating
artery diminutive or absent. Bilateral PCA branches are within
normal limits.

Antegrade flow in both ICA siphons, the right is dominant as before.
No ICA stenosis. Ophthalmic artery origins and right posterior
communicating artery origin are normal. Normal right carotid
terminus and bilateral MCA origins. Hypoplastic or absent left ACA
A1 segment. Median artery of the corpus callosum is present.
Anterior communicating artery within normal limits. Visualized
bilateral ACA branches within normal limits. Visualized bilateral
MCA branches are within normal limits.
IMPRESSION: 1. Chronically advanced cerebral white matter disease with
progression in the right corona radiata since [REDACTED]. Top
differential considerations include multiple sclerosis (mildly
favored) and progressive small vessel ischemia. CSF analysis might
aid in the diagnosis or exclusion of MS.
2. No mass effect or hemorrhage.
3. Stable, negative intracranial MRA.

## 2015-05-02 IMAGING — CR DG CHEST 1V PORT
1 series · 2 of 2 positions shown · non-contrast
Comparison: 01/28/2013

CLINICAL DATA: Weakness, headache

EXAM:
PORTABLE CHEST - 1 VIEW

[Series 1: w chest pa 8-(id) (15-22cm) · 0.14mm/px · 2 of 2 slices shown]
[im 1/2]
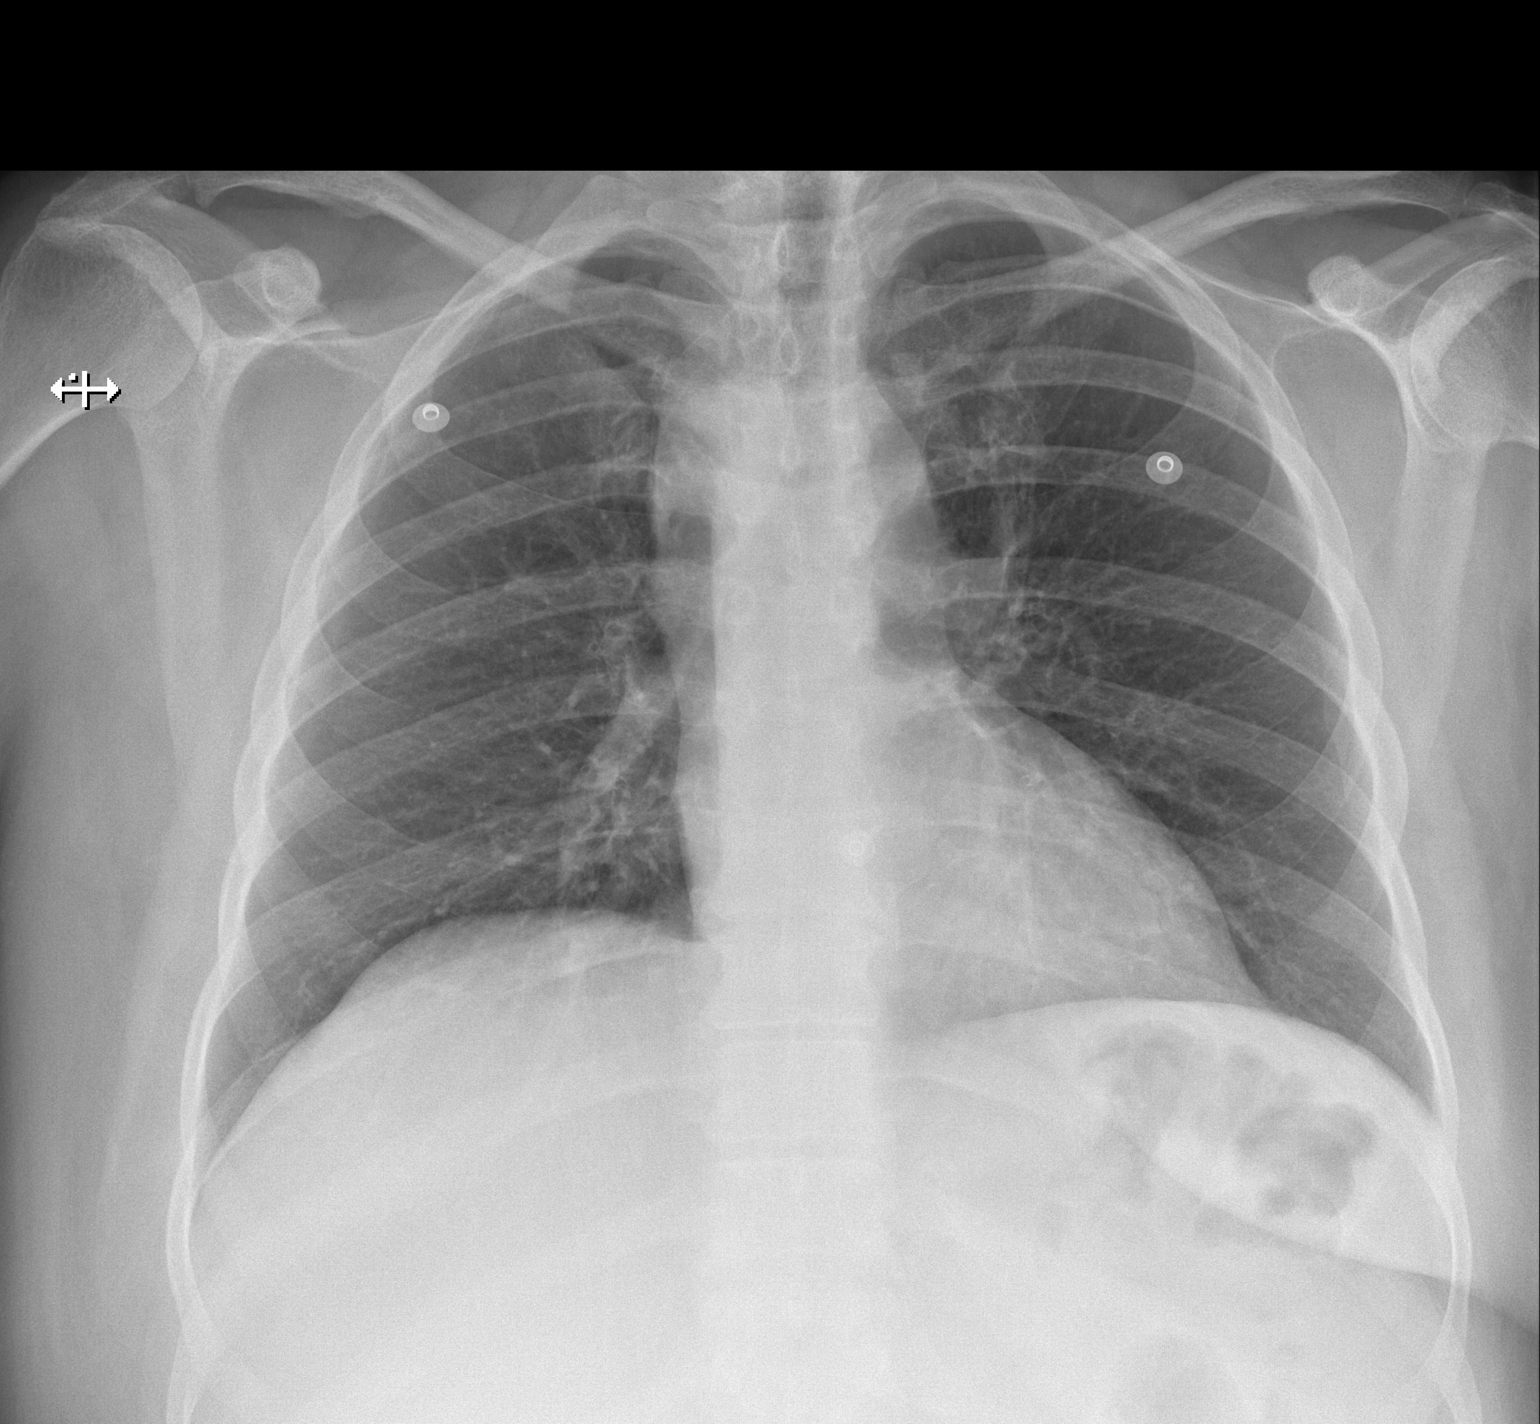
[im 2/2]
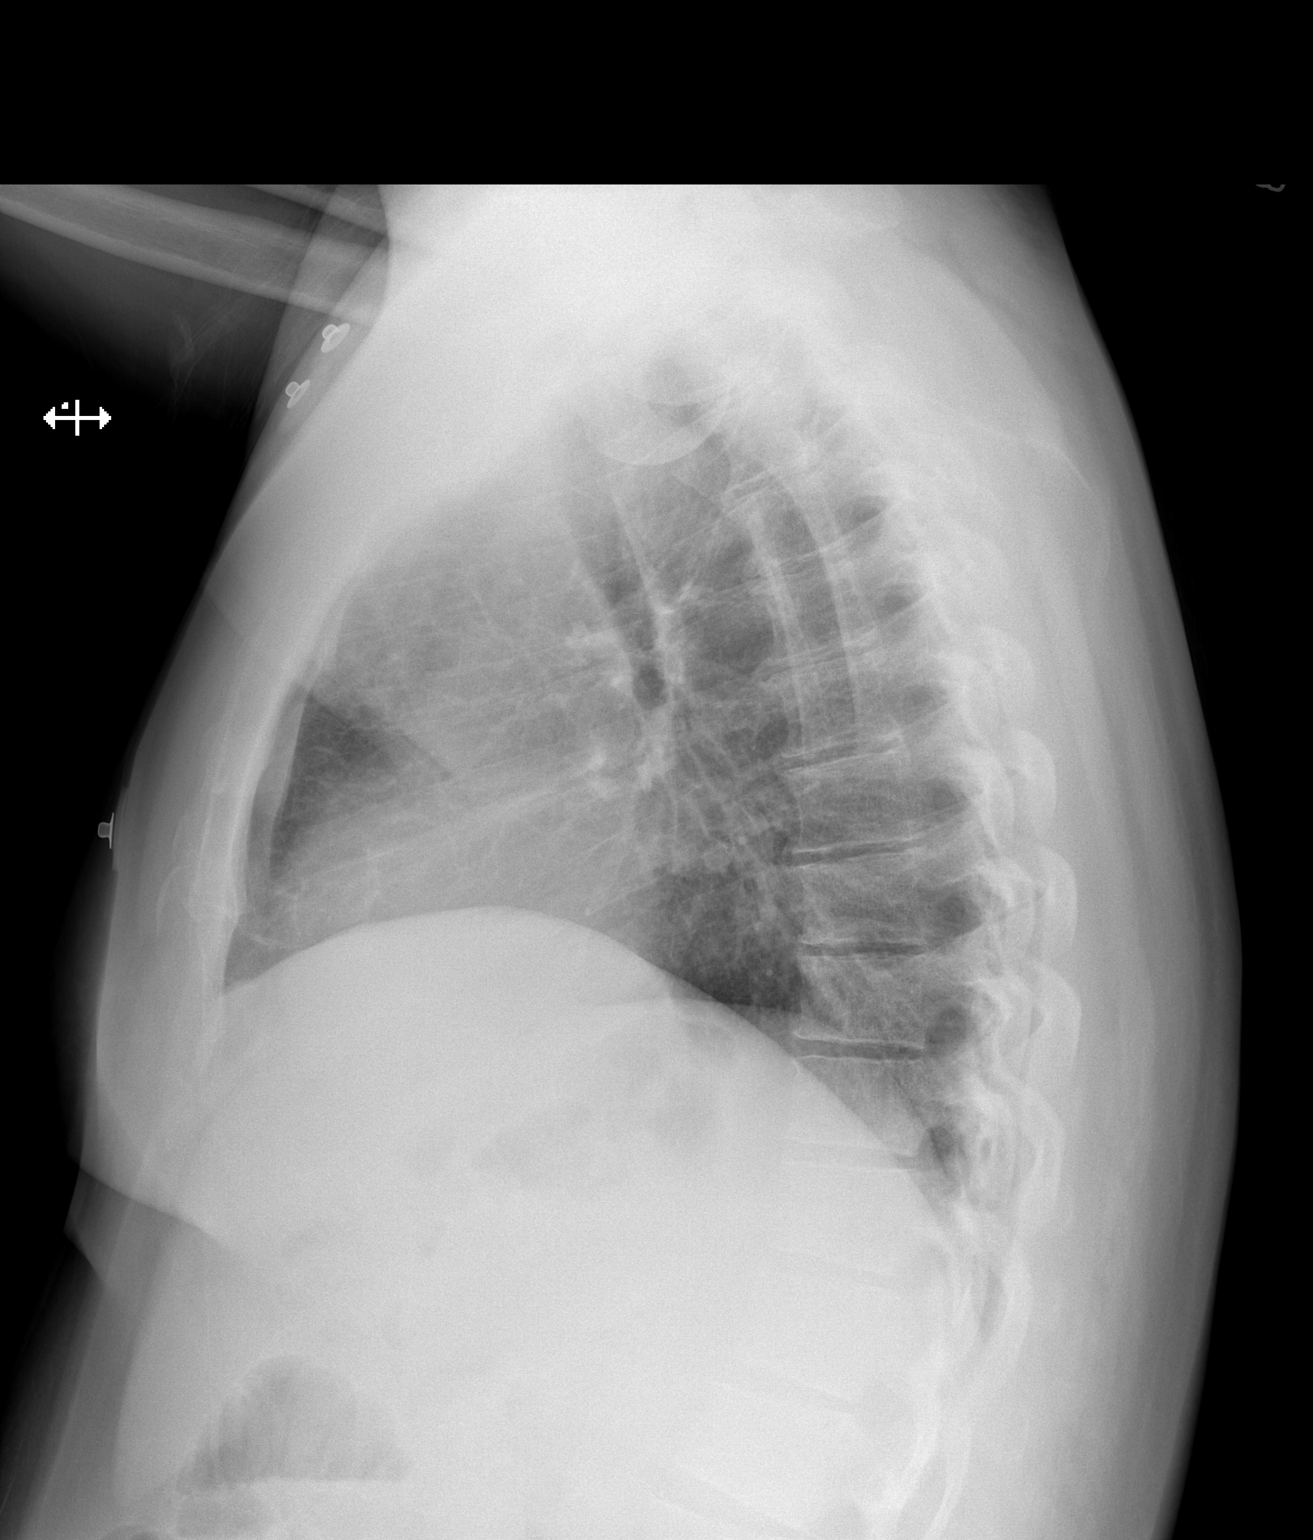

[2 of 2 positions shown; findings below may reference images not displayed]

FINDINGS: The heart size and mediastinal contours are within normal limits.
Both lungs are clear. The visualized skeletal structures are
unremarkable. Minimal L1 compression deformity is reidentified.
IMPRESSION: No active disease.

## 2015-05-03 ENCOUNTER — Other Ambulatory Visit: Payer: Self-pay | Admitting: Family Medicine

## 2015-05-03 ENCOUNTER — Encounter: Payer: Self-pay | Admitting: Family Medicine

## 2015-05-03 MED ORDER — SULFAMETHOXAZOLE-TRIMETHOPRIM 800-160 MG PO TABS: 1.0000 | ORAL_TABLET | Freq: Two times a day (BID) | ORAL | Status: AC

## 2015-05-03 MED ORDER — FLUCONAZOLE 150 MG PO TABS: 150.0000 mg | ORAL_TABLET | Freq: Once | ORAL | Status: DC

## 2015-05-03 NOTE — Progress Notes (Signed)
Patient states UTI improved a few days after starting Cipro, but burning returned about 3 days ago. Changed to bactrim DSx7 d

## 2015-05-06 ENCOUNTER — Ambulatory Visit: Payer: Self-pay | Admitting: Family Medicine

## 2015-05-09 ENCOUNTER — Other Ambulatory Visit: Payer: Self-pay | Admitting: Student

## 2015-05-09 DIAGNOSIS — M7541 Impingement syndrome of right shoulder: Secondary | ICD-10-CM

## 2015-05-09 DIAGNOSIS — M7581 Other shoulder lesions, right shoulder: Secondary | ICD-10-CM

## 2015-05-24 ENCOUNTER — Ambulatory Visit
Admission: RE | Admit: 2015-05-24 | Discharge: 2015-05-24 | Disposition: A | Discharge status: Home / Self Care | Payer: BC Managed Care – PPO | Source: Ambulatory Visit | Attending: Student | Admitting: Student

## 2015-05-24 DIAGNOSIS — M7581 Other shoulder lesions, right shoulder: Secondary | ICD-10-CM | POA: Diagnosis not present

## 2015-05-24 DIAGNOSIS — M7541 Impingement syndrome of right shoulder: Secondary | ICD-10-CM

## 2015-05-31 ENCOUNTER — Emergency Department: Payer: BC Managed Care – PPO

## 2015-05-31 ENCOUNTER — Emergency Department
Admission: EM | Admit: 2015-05-31 | Discharge: 2015-05-31 | Disposition: A | Discharge status: Home / Self Care | Payer: BC Managed Care – PPO | Attending: Student | Admitting: Student

## 2015-05-31 ENCOUNTER — Telehealth: Payer: Self-pay

## 2015-05-31 ENCOUNTER — Encounter: Payer: Self-pay | Admitting: Cardiovascular Disease

## 2015-05-31 ENCOUNTER — Encounter: Payer: Self-pay | Admitting: Emergency Medicine

## 2015-05-31 DIAGNOSIS — E039 Hypothyroidism, unspecified: Secondary | ICD-10-CM | POA: Insufficient documentation

## 2015-05-31 DIAGNOSIS — F329 Major depressive disorder, single episode, unspecified: Secondary | ICD-10-CM | POA: Diagnosis not present

## 2015-05-31 DIAGNOSIS — Z7982 Long term (current) use of aspirin: Secondary | ICD-10-CM | POA: Insufficient documentation

## 2015-05-31 DIAGNOSIS — R0789 Other chest pain: Secondary | ICD-10-CM | POA: Insufficient documentation

## 2015-05-31 DIAGNOSIS — Z86010 Personal history of colonic polyps: Secondary | ICD-10-CM | POA: Insufficient documentation

## 2015-05-31 DIAGNOSIS — R079 Chest pain, unspecified: Secondary | ICD-10-CM

## 2015-05-31 DIAGNOSIS — Z7984 Long term (current) use of oral hypoglycemic drugs: Secondary | ICD-10-CM | POA: Insufficient documentation

## 2015-05-31 DIAGNOSIS — Z87891 Personal history of nicotine dependence: Secondary | ICD-10-CM | POA: Insufficient documentation

## 2015-05-31 DIAGNOSIS — Z85820 Personal history of malignant melanoma of skin: Secondary | ICD-10-CM | POA: Insufficient documentation

## 2015-05-31 DIAGNOSIS — N184 Chronic kidney disease, stage 4 (severe): Secondary | ICD-10-CM | POA: Insufficient documentation

## 2015-05-31 DIAGNOSIS — Z8673 Personal history of transient ischemic attack (TIA), and cerebral infarction without residual deficits: Secondary | ICD-10-CM | POA: Diagnosis not present

## 2015-05-31 DIAGNOSIS — I342 Nonrheumatic mitral (valve) stenosis: Secondary | ICD-10-CM | POA: Diagnosis not present

## 2015-05-31 DIAGNOSIS — I255 Ischemic cardiomyopathy: Secondary | ICD-10-CM | POA: Diagnosis not present

## 2015-05-31 DIAGNOSIS — Z79899 Other long term (current) drug therapy: Secondary | ICD-10-CM | POA: Insufficient documentation

## 2015-05-31 DIAGNOSIS — E1122 Type 2 diabetes mellitus with diabetic chronic kidney disease: Secondary | ICD-10-CM | POA: Insufficient documentation

## 2015-05-31 DIAGNOSIS — E785 Hyperlipidemia, unspecified: Secondary | ICD-10-CM | POA: Insufficient documentation

## 2015-05-31 DIAGNOSIS — I2510 Atherosclerotic heart disease of native coronary artery without angina pectoris: Secondary | ICD-10-CM | POA: Insufficient documentation

## 2015-05-31 DIAGNOSIS — I5032 Chronic diastolic (congestive) heart failure: Secondary | ICD-10-CM | POA: Insufficient documentation

## 2015-05-31 DIAGNOSIS — I130 Hypertensive heart and chronic kidney disease with heart failure and stage 1 through stage 4 chronic kidney disease, or unspecified chronic kidney disease: Secondary | ICD-10-CM | POA: Insufficient documentation

## 2015-05-31 LAB — CBC
HCT: 39.9 % (ref 35.0–47.0)
HEMOGLOBIN: 13.2 g/dL (ref 12.0–16.0)
MCH: 25.3 pg — ABNORMAL LOW (ref 26.0–34.0)
MCHC: 33.1 g/dL (ref 32.0–36.0)
MCV: 76.4 fL — AB (ref 80.0–100.0)
PLATELETS: 155 10*3/uL (ref 150–440)
RBC: 5.22 MIL/uL — AB (ref 3.80–5.20)
RDW: 22.1 % — AB (ref 11.5–14.5)
WBC: 11.1 10*3/uL — AB (ref 3.6–11.0)

## 2015-05-31 LAB — TROPONIN I
Troponin I: <0.03 ng/mL (ref <0.031)
Troponin I: <0.03 ng/mL (ref <0.031)

## 2015-05-31 LAB — BASIC METABOLIC PANEL
ANION GAP: 10 (ref 5–15)
BUN: 25 mg/dL — ABNORMAL HIGH (ref 6–20)
CHLORIDE: 105 mmol/L (ref 101–111)
CO2: 24 mmol/L (ref 22–32)
CREATININE: 1.53 mg/dL — AB (ref 0.44–1.00)
Calcium: 9.4 mg/dL (ref 8.9–10.3)
GFR calc non Af Amer: 40 mL/min — ABNORMAL LOW (ref >60)
GFR, EST AFRICAN AMERICAN: 46 mL/min — AB (ref >60)
Glucose, Bld: 193 mg/dL — ABNORMAL HIGH (ref 65–99)
POTASSIUM: 3.9 mmol/L (ref 3.5–5.1)
SODIUM: 139 mmol/L (ref 135–145)

## 2015-05-31 MED ORDER — IOPAMIDOL (ISOVUE-370) INJECTION 76%
80.0000 mL | Freq: Once | INTRAVENOUS | Status: AC | PRN
  Administered 2015-05-31: 80 mL via INTRAVENOUS

## 2015-05-31 MED ORDER — SODIUM CHLORIDE 0.9 % IV BOLUS (SEPSIS)
500.0000 mL | Freq: Once | INTRAVENOUS | Status: AC
  Administered 2015-05-31: 500 mL via INTRAVENOUS

## 2015-05-31 MED ORDER — ONDANSETRON HCL 4 MG/2ML IJ SOLN
4.0000 mg | Freq: Once | INTRAMUSCULAR | Status: AC
  Administered 2015-05-31: 4 mg via INTRAVENOUS
  Filled 2015-05-31: qty 2

## 2015-05-31 MED ORDER — MORPHINE SULFATE (PF) 4 MG/ML IV SOLN
4.0000 mg | Freq: Once | INTRAVENOUS | Status: AC
  Administered 2015-05-31: 4 mg via INTRAVENOUS
  Filled 2015-05-31: qty 1

## 2015-05-31 MED ORDER — ISOSORBIDE MONONITRATE ER 30 MG PO TB24: 30.0000 mg | ORAL_TABLET | Freq: Every day | ORAL | Status: DC

## 2015-05-31 MED ORDER — RANOLAZINE ER 1000 MG PO TB12: 1000.0000 mg | ORAL_TABLET | Freq: Two times a day (BID) | ORAL | Status: DC

## 2015-05-31 MED ORDER — ASPIRIN 81 MG PO CHEW
324.0000 mg | CHEWABLE_TABLET | Freq: Once | ORAL | Status: AC
  Administered 2015-05-31: 324 mg via ORAL
  Filled 2015-05-31: qty 4

## 2015-05-31 MED ORDER — ISOSORBIDE MONONITRATE ER 60 MG PO TB24: 60.0000 mg | ORAL_TABLET | Freq: Every day | ORAL | Status: DC

## 2015-05-31 NOTE — ED Notes (Signed)
Patient transported to X-ray 

## 2015-05-31 NOTE — ED Provider Notes (Signed)
[facility] Emergency Department Provider Note  ____________________________________________  Time seen: Approximately 10:22 AM  I have reviewed the triage vital signs and the nursing notes.   HISTORY  Chief Complaint Chest Pain    HPI [name] is a 46 y.o. female history of CVA, hypertension, diabetes, coronary artery disease with cardiac catheterization on 04/18/2015 showing moderate LAD disease, 70% diagonal disease not amenable to intervention due to it being a small vessel but previous LAD stent patent, chronic systolic CHF, aortic arch aneurysm, aberrant left subclavian artery s/p bypass surgery, s/p gastric bypass surgery, CKD stage III, multiple sclerosis, orthostatic hypotension, HTN, HLD, syncope, hypothyroidism, migraine disorder, anxiety, and depression presents for evaluation of intermittent chest pressure radiating down her left arm, associated with some diaphoresis and shortness of breath at times but sometimes not, sometimes worse with exertion sometimes not, occurs with rest and exertion. No vomiting, diarrhea, fevers or chills. Pain is not radiating towards the back or down towards the feet, not ripping or tearing in nature. She has taken several subungual nitroglycerin tabs at home with minimal improvement of her symptoms.    Past Medical History  Diagnosis Date  . Colon polyp   . GERD (gastroesophageal reflux disease)   . CAD (coronary artery disease)     a. cath 08/09/2003: mild LAD plaque, no obs dzs noted; b. 12/18/10 stress test w/o ischemia; c. cath 11/2014: LM: nl, mid 85-90% s/p PCI/DES, mid LCX 30%, RCA nl d. cath 04/2015: 70% stenosis mid-diagonal at bifurcation. patent stent in mid-LAD. --> Medical management recommended  . CVA (cerebral vascular accident) ([place])     a. 06/2008-TEE nl LV fxn; b. 01/2013 - notes indicate short run of SVT at that time  . Multiple sclerosis ([place]) 2015  . Syncope   . Chronic systolic CHF (congestive  heart failure) ([place])     a. echo 01/29/2013 EF 40-45%, basal anteroseptal & mid anteroseptal segments abnl; b echo 05/2012 EF 50-55%, mildly dilated LA, nl RVSP  . Orthostatic hypotension   . Aortic arch aneurysm ([place])   . Ischemic cardiomyopathy   . MS (mitral stenosis)   . Malignant melanoma of skin of scalp ([place])   . Hypertension   . Depression   . Diabetes mellitus without complication ([place])   . Headache     Migrains  . Hyperlipidemia   . CKD (chronic kidney disease), stage IV ([place])   . Hypothyroidism   . Anxiety   . Gastric ulcer 04/27/2011    Patient Active Problem List   Diagnosis Date Noted  . Carotid stenosis 04/30/2015  . Type 2 diabetes mellitus with complication, without long-term current use of insulin ([place])   . Pain in the chest   . Urinary tract infection 04/05/2015  . Iron deficiency anemia 03/22/2015  . Depression 02/22/2015  . Diabetes type 2, controlled ([place]) 02/19/2015  . Vitamin B12 deficiency 02/18/2015  . Non compliance w medication regimen 02/18/2015  . Misuse of medications for pain 02/18/2015  . Chest pain 02/16/2015  . Severe recurrent major depression without psychotic features ([place]) 02/15/2015  . Helicobacter pylori infection 11/23/2014  . Hemiparesis, left ([place]) 11/23/2014  . Benign neoplasm of colon 11/20/2014  . Coronary artery disease involving native coronary artery of native heart with unstable angina pectoris ([place])   . Malignant melanoma ([place]) 08/25/2014  . Chronic kidney disease (CKD), stage III (moderate) 08/25/2014  . Chronic systolic CHF (congestive heart failure) ([place])   . Incomplete bladder emptying 07/12/2014  .  Adult hypothyroidism 12/30/2013  . Aberrant subclavian artery 11/17/2013  . Multiple sclerosis ([place]) 11/02/2013  . Cerebrovascular accident (CVA) ([place]) 06/20/2013  . Headache, migraine 05/29/2013  . Diabetes mellitus ([place]) 05/29/2013  . Type 2 diabetes mellitus ([place]) 12/12/2012  . Hyperlipidemia   . GERD  (gastroesophageal reflux disease)   . Neuropathy ([place]) 01/02/2011  . CVA (cerebral vascular accident) ([place]) 06/21/2008  . Essential hypertension 05/01/2008    Past Surgical History  Procedure Laterality Date  . Appendectomy    . Right oophorectomy    . Tubal ligation      R tube removed still w/ Left  . Dilation and curettage of uterus    . Gastric bypass  09/2009    [facility]   . Esophagogastroduodenoscopy (egd) with propofol N/A 09/14/2014    Procedure: ESOPHAGOGASTRODUODENOSCOPY (EGD) WITH PROPOFOL;  Surgeon: Josefine Class, MD;  Location: [facility] ENDOSCOPY;  Service: Endoscopy;  Laterality: N/A;  . Cardiac catheterization N/A 11/09/2014    Procedure: Coronary Angiography;  Surgeon: Minna Merritts, MD;  Location: [place] CV LAB;  Service: Cardiovascular;  Laterality: N/A;  . Cardiac catheterization N/A 11/12/2014    Procedure: Coronary Stent Intervention;  Surgeon: Isaias Cowman, MD;  Location: [place] [facility] LAB;  Service: Cardiovascular;  Laterality: N/A;  . Trigger finger release    . Melanoma excision  2016    Dr. Evorn Gong  . Cardiac catheterization N/A 04/18/2015    Procedure: Left Heart Cath and Coronary Angiography;  Surgeon: Minna Merritts, MD;  Location: [place] [facility];  Service: Cardiovascular;  Laterality: N/A;    Current Outpatient Rx  Name  Route  Sig  Dispense  Refill  . aspirin EC 81 MG tablet   Oral   Take 1 tablet (81 mg total) by mouth daily.         . baclofen (LIORESAL) 10 MG tablet   Oral   Take 1 tablet (10 mg total) by mouth 2 (two) times daily as needed for muscle spasms.      0   . canagliflozin (INVOKANA) 100 MG TABS tablet   Oral   Take 1 tablet (100 mg total) by mouth daily before breakfast.   30 tablet   5   . carvedilol (COREG) 25 MG tablet   Oral   Take 1 tablet (25 mg total) by mouth 2 (two) times daily.   60 tablet   6   . citalopram (CELEXA) 40 MG tablet   Oral   Take 1 tablet (40 mg  total) by mouth daily.   30 tablet   3   . clopidogrel (PLAVIX) 75 MG tablet   Oral   Take 1 tablet (75 mg total) by mouth daily.   30 tablet   5   . diltiazem (CARDIZEM CD) 120 MG 24 hr capsule   Oral   Take 1 capsule (120 mg total) by mouth daily.   30 capsule   5   . eszopiclone (LUNESTA) 2 MG TABS tablet   Oral   Take 2 mg by mouth at bedtime as needed for sleep. Take immediately before bedtime         . isosorbide mononitrate (IMDUR) 60 MG 24 hr tablet   Oral   Take 1 tablet (60 mg total) by mouth daily.   30 tablet   5   . levothyroxine (SYNTHROID, LEVOTHROID) 25 MCG tablet   Oral   Take 1 tablet (25 mcg total) by mouth daily before breakfast.  30 tablet   5   . losartan (COZAAR) 25 MG tablet   Oral   Take 1 tablet (25 mg total) by mouth daily.   30 tablet   5   . metFORMIN (GLUCOPHAGE) 500 MG tablet   Oral   Take 2 tablets (1,000 mg total) by mouth at bedtime. START FROM 04/21/15   60 tablet   2   . Multiple Vitamin (MULTIVITAMIN WITH MINERALS) TABS tablet   Oral   Take 1 tablet by mouth daily.         . nitroGLYCERIN (NITROSTAT) 0.4 MG SL tablet   Sublingual   Place 1 tablet (0.4 mg total) under the tongue every 5 (five) minutes as needed for chest pain.   25 tablet   6   . pantoprazole (PROTONIX) 40 MG tablet   Oral   Take 1 tablet (40 mg total) by mouth 2 (two) times daily.   60 tablet   5   . pregabalin (LYRICA) 100 MG capsule   Oral   Take 1 capsule (100 mg total) by mouth 2 (two) times daily.   60 capsule   5   . promethazine (PHENERGAN) 25 MG tablet   Oral   Take 25 mg by mouth every 6 (six) hours as needed for nausea or vomiting. Reported on 05/31/2015         . rosuvastatin (CRESTOR) 40 MG tablet   Oral   Take 1 tablet (40 mg total) by mouth daily at 6 PM.   90 tablet   3   . traZODone (DESYREL) 100 MG tablet   Oral   Take 200 mg by mouth at bedtime.         . ranolazine (RANEXA) 1000 MG SR tablet   Oral   Take 1  tablet (1,000 mg total) by mouth 2 (two) times daily.   60 tablet   6     Allergies Review of patient's allergies indicates no known allergies.  Family History  Problem Relation Age of Onset  . Hypertension Mother   . Anxiety disorder Mother   . Depression Mother   . Bipolar disorder Mother   . Heart disease Mother   . Hyperlipidemia Mother   . Kidney disease Father   . Heart disease Father   . Hypertension Father   . Diabetes Father   . Stroke Father   . Colon cancer Father     dx in his 10's  . Anxiety disorder Father   . Depression Father   . Skin cancer Father   . Kidney disease Sister   . Thyroid nodules Sister   . Hypertension Sister   . Hypertension Sister   . Diabetes Sister   . Hyperlipidemia Sister   . Depression Sister     Social History Social History  Substance Use Topics  . Smoking status: Former Smoker    Types: Cigarettes    Quit date: 08/31/1994  . Smokeless tobacco: Never Used     Comment: quit 28 years ago  . Alcohol Use: No    Review of Systems Constitutional: No fever/chills Eyes: No visual changes. ENT: No sore throat. Cardiovascular: + chest pain. Respiratory: + shortness of breath. Gastrointestinal: No abdominal pain.  No nausea, no vomiting.  No diarrhea.  No constipation. Genitourinary: Negative for dysuria. Musculoskeletal: Negative for back pain. Skin: Negative for rash. Neurological: Negative for headaches, focal weakness or numbness.  10-point ROS otherwise negative.  ____________________________________________   PHYSICAL EXAM:  Filed Vitals:  05/31/15 1028 05/31/15 1030 05/31/15 1324  BP: 159/96 154/93 160/80  Pulse: 73 83 61  Temp: 98.2 F (36.8 C)    TempSrc: Oral    Resp: 15 12 14   Height: 5\' 3"  (1.6 m)    Weight: 185 lb (83.915 kg)    SpO2: 96% 97% 97%     Constitutional: Alert and oriented. Well appearing and in no acute distress. Eyes: Conjunctivae are normal. PERRL. EOMI. Head: Atraumatic. Nose:  No congestion/rhinnorhea. Mouth/Throat: Mucous membranes are moist.  Oropharynx non-erythematous. Neck: No stridor. Supple without meningismus. Cardiovascular: Normal rate, regular rhythm. Grossly normal heart sounds.  Good peripheral circulation. Respiratory: Normal respiratory effort.  No retractions. Lungs CTAB. Gastrointestinal: Soft and nontender. No distention.  No CVA tenderness. Genitourinary: deferred Musculoskeletal: No lower extremity tenderness nor edema.  No joint effusions. Neurologic:  Normal speech and language. No gross focal neurologic deficits are appreciated. No gait instability. Skin:  Skin is warm, dry and intact. No rash noted. Psychiatric: Mood and affect are normal. Speech and behavior are normal.  ____________________________________________   LABS (all labs ordered are listed, but only abnormal results are displayed)  Labs Reviewed  BASIC METABOLIC PANEL - Abnormal; Notable for the following:    Glucose, Bld 193 (*)    BUN 25 (*)    Creatinine, Ser 1.53 (*)    GFR calc non Af Amer 40 (*)    GFR calc Af Amer 46 (*)    All other components within normal limits  CBC - Abnormal; Notable for the following:    WBC 11.1 (*)    RBC 5.22 (*)    MCV 76.4 (*)    MCH 25.3 (*)    RDW 22.1 (*)    All other components within normal limits  TROPONIN I  TROPONIN I   ____________________________________________  EKG  ED ECG REPORT I, Joanne Gavel, the attending physician, personally viewed and interpreted this ECG.   Date: 05/31/2015  EKG Time: 10:29  Rate: 77  Rhythm: normal sinus rhythm  Axis: normal  Intervals:none  ST&T Change: Q waves in V2, V3 and T-wave flattening in V5, V6. No acute ST elevation.  ____________________________________________  RADIOLOGY  CXR  IMPRESSION: No acute disease.   CTA chest IMPRESSION: No evidence of aortic dissection.  Right aortic arch with left a variant subclavian artery, closure, and bypass from left  common carotid. Similar appearance to prior study. ____________________________________________   PROCEDURES  Procedure(s) performed: None  Critical Care performed: No  ____________________________________________   INITIAL IMPRESSION / ASSESSMENT AND PLAN / ED COURSE  Pertinent labs & imaging results that were available during my care of the patient were reviewed by me and considered in my medical decision making (see chart for details).  [name] is a 45 y.o. female history of CVA, CKD, hypertension, diabetes, coronary artery disease with cardiac catheterization on 04/18/2015 showing moderate LAD disease, 70% diagonal disease not amenable to intervention due to it being a small vessel but previous LAD stent patent, chronic systolic CHF, aortic arch aneurysm, aberrant left subclavian artery s/p bypass surgery, s/p gastric bypass surgery, CKD stage III, multiple sclerosis, orthostatic hypotension, HTN, HLD, syncope, hypothyroidism, migraine disorder, anxiety, and depression presents for evaluation of intermittent chest pressure radiating down her left arm. On exam, she is nontoxic appearing and in no acute distress. Vital signs stable, she is afebrile. EKG shows new anterior Q waves as well as T wave flattening laterally. Initial troponin negative however given no guarding artery  disease, EKG changes, ongoing chest pain, we'll discuss case with cardiology. Will treat her pain and reassess for disposition. Aspirin ordered.  ----------------------------------------- 11:46 AM on 05/31/2015 ----------------------------------------- Initial troponin negative, pain improving. I discussed the case with Dr. [name], the patient's cardiologist. He reviewed her EKG from today as well as prior EKGs and reports that they are similar when accounting for differences in lead placement, there is no new abnormalities. He reports the patient has chronic chest discomfort and given that she recently had  catheterization, he recommends treatment of pain and medication modification. If her pain is well-controlled and her second troponin is negative, she can be discharged and he will arrange expedient follow-up. If not, will admit.  ----------------------------------------- 2:32 PM on 05/31/2015 -----------------------------------------  Second troponin negative. Patient appears comfortable with improvement of her pain. Per our discussion with Dr. [name] and we will increase her isosorbide, adding 30 mg at night. She is hypertensive here and I suspect her blood pressure will tolerate that fine. We discussed return precautions, need for close outpatient follow-up and she is comfortable with the discharge plan. DC home. ____________________________________________   FINAL CLINICAL IMPRESSION(S) / ED DIAGNOSES  Final diagnoses:  Chest pain, unspecified chest pain type      Joanne Gavel, MD 05/31/15 1437

## 2015-05-31 NOTE — ED Notes (Signed)
Pt verbalized understanding of discharge instructions. NAD at this time. 

## 2015-05-31 NOTE — Telephone Encounter (Signed)
Spoke w/pt.  She is waiting to be d/c'd from ED.  She is crying, stating "I'm ready to give up." States that she is "missing out on life" b/c of her chest pain and constant visits to the ED. She states that she does not want to take anymore pills, just wants the pain to resolve so that she can live a "normal" life. Her husband is not present w/ her at the moment, he took the dog to the groomer's and is on his way back to pick her up. She states that she feels that he "getting tired" of her and all of her health issues. Pt denies feeling unsafe in her home, reports that "they are getting along". Advised pt that she is not yet scheduled for f/u, but she can be seen on Monday, as we had a cancellation.  She is concerned about the cost of the visit, as she does not have the money for a co-pay. Advised her that we can bill her for this, that I recommend she come in for ov to keep her from going back to ED so often.  She states that she is "not coming back here for any reason". Asked her to call the office is she has sx, as we can determine if her meds can be adjusted over the phone or if she needs immediate attention.  She is appreciative of the call and will stop by to p/u samples and sched f/u for next week.  Asked her to call back if we can be of further assistance.

## 2015-05-31 NOTE — ED Notes (Signed)
Patient to ER for c/o chest pain that began a few days ago. Patient states she has been taking nitro to relieve pain. Has h/o 95% blockage in one artery, 75% in another. Patient states today she has had 3 nitro with minimal relief. Now having pain radiating down left arm, diaphoresis, and light headedness.

## 2015-05-31 NOTE — Telephone Encounter (Addendum)
Per Dr. [name], pt seen in ED for chest pain, she was advised to take an extra isosorbide 30 mg QHS for anginal pain. Ranexa 500 mg (we are out of 1000 mg) samples left at front desk for pt to p/u at her convenience w/ instructions to take 500 mg BID x 1 week, then increase to 1000 mg BID. Savings coupon provided, as well.  Pt will need 1 week f/u appt w/ Dr. Lissa Hoard, Ignacia Bayley, NP or Christell Faith, [place].

## 2015-06-03 ENCOUNTER — Encounter: Payer: Self-pay | Admitting: Cardiovascular Disease

## 2015-06-03 ENCOUNTER — Inpatient Hospital Stay
Admission: EM | Admit: 2015-06-03 | Discharge: 2015-06-05 | DRG: 247 | Disposition: A | Discharge status: Home / Self Care | Payer: BC Managed Care – PPO | Attending: Internal Medicine | Admitting: Internal Medicine

## 2015-06-03 ENCOUNTER — Ambulatory Visit (INDEPENDENT_AMBULATORY_CARE_PROVIDER_SITE_OTHER): Payer: BC Managed Care – PPO | Admitting: Cardiovascular Disease

## 2015-06-03 VITALS — BP 128/80 | HR 119 | Ht 63.0 in | Wt 190.8 lb

## 2015-06-03 DIAGNOSIS — I200 Unstable angina: Secondary | ICD-10-CM | POA: Diagnosis present

## 2015-06-03 DIAGNOSIS — Z841 Family history of disorders of kidney and ureter: Secondary | ICD-10-CM | POA: Diagnosis not present

## 2015-06-03 DIAGNOSIS — Z8711 Personal history of peptic ulcer disease: Secondary | ICD-10-CM

## 2015-06-03 DIAGNOSIS — Z955 Presence of coronary angioplasty implant and graft: Secondary | ICD-10-CM | POA: Diagnosis not present

## 2015-06-03 DIAGNOSIS — I255 Ischemic cardiomyopathy: Secondary | ICD-10-CM | POA: Diagnosis present

## 2015-06-03 DIAGNOSIS — R079 Chest pain, unspecified: Secondary | ICD-10-CM | POA: Diagnosis not present

## 2015-06-03 DIAGNOSIS — Z9884 Bariatric surgery status: Secondary | ICD-10-CM

## 2015-06-03 DIAGNOSIS — Z800 Family history of malignant neoplasm of digestive organs: Secondary | ICD-10-CM | POA: Diagnosis not present

## 2015-06-03 DIAGNOSIS — E1122 Type 2 diabetes mellitus with diabetic chronic kidney disease: Secondary | ICD-10-CM | POA: Diagnosis present

## 2015-06-03 DIAGNOSIS — E785 Hyperlipidemia, unspecified: Secondary | ICD-10-CM | POA: Diagnosis present

## 2015-06-03 DIAGNOSIS — Z7982 Long term (current) use of aspirin: Secondary | ICD-10-CM | POA: Diagnosis not present

## 2015-06-03 DIAGNOSIS — I25110 Atherosclerotic heart disease of native coronary artery with unstable angina pectoris: Secondary | ICD-10-CM | POA: Diagnosis not present

## 2015-06-03 DIAGNOSIS — Z85820 Personal history of malignant melanoma of skin: Secondary | ICD-10-CM | POA: Diagnosis not present

## 2015-06-03 DIAGNOSIS — Z823 Family history of stroke: Secondary | ICD-10-CM | POA: Diagnosis not present

## 2015-06-03 DIAGNOSIS — E1159 Type 2 diabetes mellitus with other circulatory complications: Secondary | ICD-10-CM | POA: Diagnosis present

## 2015-06-03 DIAGNOSIS — IMO0002 Reserved for concepts with insufficient information to code with codable children: Secondary | ICD-10-CM | POA: Diagnosis present

## 2015-06-03 DIAGNOSIS — N184 Chronic kidney disease, stage 4 (severe): Secondary | ICD-10-CM | POA: Diagnosis present

## 2015-06-03 DIAGNOSIS — Z8249 Family history of ischemic heart disease and other diseases of the circulatory system: Secondary | ICD-10-CM

## 2015-06-03 DIAGNOSIS — Z9049 Acquired absence of other specified parts of digestive tract: Secondary | ICD-10-CM

## 2015-06-03 DIAGNOSIS — Z79899 Other long term (current) drug therapy: Secondary | ICD-10-CM

## 2015-06-03 DIAGNOSIS — Z9889 Other specified postprocedural states: Secondary | ICD-10-CM | POA: Diagnosis not present

## 2015-06-03 DIAGNOSIS — I951 Orthostatic hypotension: Secondary | ICD-10-CM | POA: Diagnosis present

## 2015-06-03 DIAGNOSIS — K219 Gastro-esophageal reflux disease without esophagitis: Secondary | ICD-10-CM | POA: Diagnosis present

## 2015-06-03 DIAGNOSIS — G35 Multiple sclerosis: Secondary | ICD-10-CM | POA: Diagnosis present

## 2015-06-03 DIAGNOSIS — Z9851 Tubal ligation status: Secondary | ICD-10-CM | POA: Diagnosis not present

## 2015-06-03 DIAGNOSIS — Z87891 Personal history of nicotine dependence: Secondary | ICD-10-CM

## 2015-06-03 DIAGNOSIS — I25111 Atherosclerotic heart disease of native coronary artery with angina pectoris with documented spasm: Secondary | ICD-10-CM | POA: Diagnosis present

## 2015-06-03 DIAGNOSIS — K529 Noninfective gastroenteritis and colitis, unspecified: Secondary | ICD-10-CM

## 2015-06-03 DIAGNOSIS — I2510 Atherosclerotic heart disease of native coronary artery without angina pectoris: Secondary | ICD-10-CM | POA: Diagnosis not present

## 2015-06-03 DIAGNOSIS — Z818 Family history of other mental and behavioral disorders: Secondary | ICD-10-CM

## 2015-06-03 DIAGNOSIS — I130 Hypertensive heart and chronic kidney disease with heart failure and stage 1 through stage 4 chronic kidney disease, or unspecified chronic kidney disease: Secondary | ICD-10-CM | POA: Diagnosis present

## 2015-06-03 DIAGNOSIS — I214 Non-ST elevation (NSTEMI) myocardial infarction: Secondary | ICD-10-CM | POA: Diagnosis present

## 2015-06-03 DIAGNOSIS — I5022 Chronic systolic (congestive) heart failure: Secondary | ICD-10-CM | POA: Diagnosis present

## 2015-06-03 DIAGNOSIS — E039 Hypothyroidism, unspecified: Secondary | ICD-10-CM | POA: Diagnosis present

## 2015-06-03 DIAGNOSIS — Z808 Family history of malignant neoplasm of other organs or systems: Secondary | ICD-10-CM

## 2015-06-03 DIAGNOSIS — Z833 Family history of diabetes mellitus: Secondary | ICD-10-CM | POA: Diagnosis not present

## 2015-06-03 DIAGNOSIS — Z90721 Acquired absence of ovaries, unilateral: Secondary | ICD-10-CM | POA: Diagnosis not present

## 2015-06-03 DIAGNOSIS — E1165 Type 2 diabetes mellitus with hyperglycemia: Secondary | ICD-10-CM | POA: Diagnosis present

## 2015-06-03 DIAGNOSIS — N183 Chronic kidney disease, stage 3 (moderate): Secondary | ICD-10-CM

## 2015-06-03 DIAGNOSIS — Z8673 Personal history of transient ischemic attack (TIA), and cerebral infarction without residual deficits: Secondary | ICD-10-CM | POA: Diagnosis not present

## 2015-06-03 DIAGNOSIS — Z86010 Personal history of colonic polyps: Secondary | ICD-10-CM

## 2015-06-03 HISTORY — DX: Noninfective gastroenteritis and colitis, unspecified: K52.9

## 2015-06-03 LAB — COMPREHENSIVE METABOLIC PANEL
ALT: 24 U/L (ref 14–54)
ANION GAP: 10 (ref 5–15)
AST: 23 U/L (ref 15–41)
Albumin: 3.9 g/dL (ref 3.5–5.0)
Alkaline Phosphatase: 90 U/L (ref 38–126)
BILIRUBIN TOTAL: 0.5 mg/dL (ref 0.3–1.2)
BUN: 21 mg/dL — ABNORMAL HIGH (ref 6–20)
CHLORIDE: 108 mmol/L (ref 101–111)
CO2: 22 mmol/L (ref 22–32)
Calcium: 9.4 mg/dL (ref 8.9–10.3)
Creatinine, Ser: 1.57 mg/dL — ABNORMAL HIGH (ref 0.44–1.00)
GFR calc Af Amer: 45 mL/min — ABNORMAL LOW (ref >60)
GFR, EST NON AFRICAN AMERICAN: 39 mL/min — AB (ref >60)
Glucose, Bld: 164 mg/dL — ABNORMAL HIGH (ref 65–99)
POTASSIUM: 4.3 mmol/L (ref 3.5–5.1)
Sodium: 140 mmol/L (ref 135–145)
TOTAL PROTEIN: 7.1 g/dL (ref 6.5–8.1)

## 2015-06-03 LAB — MAGNESIUM: MAGNESIUM: 1.8 mg/dL (ref 1.7–2.4)

## 2015-06-03 LAB — GLUCOSE, CAPILLARY
GLUCOSE-CAPILLARY: 106 mg/dL — AB (ref 65–99)
GLUCOSE-CAPILLARY: 266 mg/dL — AB (ref 65–99)

## 2015-06-03 LAB — TROPONIN I: TROPONIN I: 0.04 ng/mL — AB (ref <0.031)

## 2015-06-03 LAB — PROTIME-INR
INR: 1.04
PROTHROMBIN TIME: 13.8 s (ref 11.4–15.0)

## 2015-06-03 LAB — CBC
HEMATOCRIT: 38.9 % (ref 35.0–47.0)
Hemoglobin: 12.9 g/dL (ref 12.0–16.0)
MCH: 25.6 pg — ABNORMAL LOW (ref 26.0–34.0)
MCHC: 33.2 g/dL (ref 32.0–36.0)
MCV: 76.9 fL — AB (ref 80.0–100.0)
PLATELETS: 154 10*3/uL (ref 150–440)
RBC: 5.06 MIL/uL (ref 3.80–5.20)
RDW: 22 % — AB (ref 11.5–14.5)
WBC: 11 10*3/uL (ref 3.6–11.0)

## 2015-06-03 LAB — APTT: APTT: 28 s (ref 24–36)

## 2015-06-03 LAB — HEPARIN LEVEL (UNFRACTIONATED): Heparin Unfractionated: <0.1 IU/mL — ABNORMAL LOW (ref 0.30–0.70)

## 2015-06-03 MED ORDER — ACETAMINOPHEN 325 MG PO TABS: 650.0000 mg | ORAL_TABLET | ORAL | Status: DC | PRN

## 2015-06-03 MED ORDER — ZOLPIDEM TARTRATE 5 MG PO TABS: 5.0000 mg | ORAL_TABLET | Freq: Every evening | ORAL | Status: DC | PRN

## 2015-06-03 MED ORDER — CARVEDILOL 12.5 MG PO TABS
25.0000 mg | ORAL_TABLET | Freq: Two times a day (BID) | ORAL | Status: DC
  Administered 2015-06-03 – 2015-06-05 (×3): 25 mg via ORAL
  Filled 2015-06-03 (×3): qty 2

## 2015-06-03 MED ORDER — ISOSORBIDE MONONITRATE ER 30 MG PO TB24
30.0000 mg | ORAL_TABLET | Freq: Every day | ORAL | Status: DC
  Administered 2015-06-03 – 2015-06-04 (×2): 30 mg via ORAL
  Filled 2015-06-03 (×2): qty 1

## 2015-06-03 MED ORDER — HEPARIN BOLUS VIA INFUSION
2150.0000 [IU] | Freq: Once | INTRAVENOUS | Status: AC
  Administered 2015-06-03: 2150 [IU] via INTRAVENOUS
  Filled 2015-06-03: qty 2150

## 2015-06-03 MED ORDER — NITROGLYCERIN 2 % TD OINT: 1.0000 [in_us] | TOPICAL_OINTMENT | Freq: Three times a day (TID) | TRANSDERMAL | Status: DC

## 2015-06-03 MED ORDER — LOSARTAN POTASSIUM 25 MG PO TABS
25.0000 mg | ORAL_TABLET | Freq: Every day | ORAL | Status: DC
  Administered 2015-06-05: 25 mg via ORAL
  Filled 2015-06-03: qty 1

## 2015-06-03 MED ORDER — CANAGLIFLOZIN 100 MG PO TABS
100.0000 mg | ORAL_TABLET | Freq: Every day | ORAL | Status: DC
  Filled 2015-06-03 (×2): qty 1

## 2015-06-03 MED ORDER — HEPARIN SODIUM (PORCINE) 5000 UNIT/ML IJ SOLN
INTRAMUSCULAR | Status: AC
  Filled 2015-06-03: qty 1

## 2015-06-03 MED ORDER — PANTOPRAZOLE SODIUM 40 MG PO TBEC
40.0000 mg | DELAYED_RELEASE_TABLET | Freq: Two times a day (BID) | ORAL | Status: DC
  Administered 2015-06-03 – 2015-06-05 (×4): 40 mg via ORAL
  Filled 2015-06-03 (×4): qty 1

## 2015-06-03 MED ORDER — CITALOPRAM HYDROBROMIDE 20 MG PO TABS
40.0000 mg | ORAL_TABLET | Freq: Every day | ORAL | Status: DC
  Administered 2015-06-04 – 2015-06-05 (×2): 40 mg via ORAL
  Filled 2015-06-03 (×2): qty 2

## 2015-06-03 MED ORDER — TRAZODONE HCL 100 MG PO TABS
200.0000 mg | ORAL_TABLET | Freq: Every day | ORAL | Status: DC
  Administered 2015-06-03 – 2015-06-04 (×2): 200 mg via ORAL
  Filled 2015-06-03 (×2): qty 2

## 2015-06-03 MED ORDER — INSULIN ASPART 100 UNIT/ML ~~LOC~~ SOLN: 0.0000 [IU] | Freq: Three times a day (TID) | SUBCUTANEOUS | Status: DC

## 2015-06-03 MED ORDER — ROSUVASTATIN CALCIUM 20 MG PO TABS
40.0000 mg | ORAL_TABLET | Freq: Every day | ORAL | Status: DC
  Administered 2015-06-03: 40 mg via ORAL
  Filled 2015-06-03: qty 2

## 2015-06-03 MED ORDER — MORPHINE SULFATE (PF) 2 MG/ML IV SOLN
2.0000 mg | INTRAVENOUS | Status: DC | PRN
  Administered 2015-06-03 (×2): 2 mg via INTRAVENOUS
  Filled 2015-06-03: qty 1

## 2015-06-03 MED ORDER — ONDANSETRON HCL 4 MG/2ML IJ SOLN
4.0000 mg | INTRAMUSCULAR | Status: AC
  Administered 2015-06-03: 4 mg via INTRAVENOUS
  Filled 2015-06-03: qty 2

## 2015-06-03 MED ORDER — CLOPIDOGREL BISULFATE 75 MG PO TABS
75.0000 mg | ORAL_TABLET | Freq: Every day | ORAL | Status: DC
  Administered 2015-06-04 – 2015-06-05 (×2): 75 mg via ORAL
  Filled 2015-06-03 (×2): qty 1

## 2015-06-03 MED ORDER — NITROGLYCERIN 2 % TD OINT
1.0000 [in_us] | TOPICAL_OINTMENT | Freq: Four times a day (QID) | TRANSDERMAL | Status: DC
  Administered 2015-06-03 – 2015-06-04 (×3): 1 [in_us] via TOPICAL
  Filled 2015-06-03: qty 1
  Filled 2015-06-03: qty 30
  Filled 2015-06-03: qty 1

## 2015-06-03 MED ORDER — HEPARIN BOLUS VIA INFUSION
4000.0000 [IU] | Freq: Once | INTRAVENOUS | Status: AC
  Administered 2015-06-03: 4000 [IU] via INTRAVENOUS
  Filled 2015-06-03: qty 4000

## 2015-06-03 MED ORDER — ISOSORBIDE MONONITRATE ER 60 MG PO TB24
60.0000 mg | ORAL_TABLET | Freq: Every day | ORAL | Status: DC
  Administered 2015-06-05: 60 mg via ORAL
  Filled 2015-06-03: qty 1

## 2015-06-03 MED ORDER — RANOLAZINE ER 500 MG PO TB12
1000.0000 mg | ORAL_TABLET | Freq: Two times a day (BID) | ORAL | Status: DC
  Administered 2015-06-03 – 2015-06-05 (×3): 1000 mg via ORAL
  Filled 2015-06-03 (×4): qty 2

## 2015-06-03 MED ORDER — ASPIRIN 81 MG PO CHEW
324.0000 mg | CHEWABLE_TABLET | Freq: Once | ORAL | Status: AC
  Administered 2015-06-03: 324 mg via ORAL
  Filled 2015-06-03: qty 4

## 2015-06-03 MED ORDER — PREGABALIN 50 MG PO CAPS
100.0000 mg | ORAL_CAPSULE | Freq: Two times a day (BID) | ORAL | Status: DC
  Administered 2015-06-03 – 2015-06-05 (×4): 100 mg via ORAL
  Filled 2015-06-03 (×4): qty 2

## 2015-06-03 MED ORDER — MORPHINE SULFATE (PF) 4 MG/ML IV SOLN
INTRAVENOUS | Status: AC
  Administered 2015-06-03: 2 mg via INTRAVENOUS
  Filled 2015-06-03: qty 1

## 2015-06-03 MED ORDER — ONDANSETRON HCL 4 MG/2ML IJ SOLN
4.0000 mg | Freq: Four times a day (QID) | INTRAMUSCULAR | Status: DC | PRN
  Administered 2015-06-03: 4 mg via INTRAVENOUS
  Filled 2015-06-03: qty 2

## 2015-06-03 MED ORDER — MORPHINE SULFATE (PF) 4 MG/ML IV SOLN
4.0000 mg | Freq: Once | INTRAVENOUS | Status: AC
  Administered 2015-06-03: 4 mg via INTRAVENOUS
  Administered 2015-06-03: 2 mg via INTRAVENOUS
  Filled 2015-06-03: qty 1

## 2015-06-03 MED ORDER — ASPIRIN EC 81 MG PO TBEC
81.0000 mg | DELAYED_RELEASE_TABLET | Freq: Every day | ORAL | Status: DC
  Administered 2015-06-04 – 2015-06-05 (×2): 81 mg via ORAL
  Filled 2015-06-03 (×2): qty 1

## 2015-06-03 MED ORDER — LEVOTHYROXINE SODIUM 25 MCG PO TABS
25.0000 ug | ORAL_TABLET | Freq: Every day | ORAL | Status: DC
  Administered 2015-06-04 – 2015-06-05 (×2): 25 ug via ORAL
  Filled 2015-06-03 (×2): qty 1

## 2015-06-03 MED ORDER — HEPARIN (PORCINE) IN NACL 100-0.45 UNIT/ML-% IJ SOLN
1400.0000 [IU]/h | INTRAMUSCULAR | Status: DC
  Administered 2015-06-03: 850 [IU]/h via INTRAVENOUS
  Filled 2015-06-03 (×3): qty 250

## 2015-06-03 MED ORDER — DILTIAZEM HCL ER COATED BEADS 120 MG PO CP24
120.0000 mg | ORAL_CAPSULE | Freq: Every day | ORAL | Status: DC
  Administered 2015-06-05: 120 mg via ORAL
  Filled 2015-06-03: qty 1

## 2015-06-03 MED ORDER — ADULT MULTIVITAMIN W/MINERALS CH
1.0000 | ORAL_TABLET | Freq: Every day | ORAL | Status: DC
  Administered 2015-06-04 – 2015-06-05 (×2): 1 via ORAL
  Filled 2015-06-03 (×2): qty 1

## 2015-06-03 NOTE — Progress Notes (Signed)
Patient ID: [name], female    DOB: [birth year], 45 y.o.   MRN: [id number]  HPI Comments: 45 year old woman with a long hx of smoking, numerous prior strokes,  history of chest pain dating back to 2005 at that time with cardiac catheterization on August 09, 2003 found to have mild plaque in the LAD, but there was no obstructive disease noted.   MUGA, which showed an ejection fraction of 51%.   Transesophageal echocardiogram in the setting of a CVA in May 2010  showed normal LV function, s/p gastric bypass surgery, Who presents for routine followup of her coronary artery disease  Cardiac catheterization   March 2017showing moderate LAD disease, 70% diagonal disease small vessel, near the ostium,  Decision was made to treat this medically   Over the past week she has had worsening unstable angina symptoms  Reports that she was in the emergency room last Friday, EKG unchanged, cardiac enzymes negative, recommended to increase isosorbide in the evening and start Ranexa  For  small vessel disease.   she has done this and despite this has continued to have chest pain symptoms.  Unable to drive this weekend secondary to symptoms  Reports that today she is orally taken 5 nitroglycerin for chest pain symptoms, had 10 out of 10 chest pain in the office today  she reports feeling sweaty   EKG done on today's visit reviewed with her shows new ST and T-wave depression inversion V5, V6, 1 and aVL  new EKG changes compared to 1 week ago and prior EKGs   other past medical history reviewed Recent carotid ultrasound reviewed with her showing minimal bilateral carotid disease Recent echocardiogram reviewed with her showing essentially normal ejection fraction  She is status post left carotid to subclavian artery bypass with subclavian artery ligation, done at [facility] by vascular surgery admission to [facility] from 10/5 to 11/13/14 with unstable angina and was found to have severe mid LAD lesion at 85-90%  stenosis on diagnositc cardiac cath s/p PCI/DES on staged procedure.   CT in October 2015 showing Right aortic arch with a large retro- esophageal vascular segment, persistent dorsal aortic segment) giving off aberrant left subclavian artery. This large caliber retroaortic segment measuring 18 mm compared to the ascending aorta which measures 22 mm. This large retroesophageal aortic segment along with the partially calcified atretic ductus ligament between the left aberrant subclavian artery and the left pulmonary artery creates an ALMOSTcomplete vascular ring. This is likelythe cause ofpatient's dysphagia [place].  Previously was taking midodrine or Florinef for hypotension following her stroke.  She previously presented to the hospital December 18, 2010 with chest pain.   stress test in the hospital and showed no ischemia. Cardiac enzymes were negative. No EKG changes noted.   admission to the hospital 11/01/2012 with discharge 11/02/2012 with chest pain. She ruled out with negative cardiac enzymes. Blood pressures were in the [id number] systolic range. Discharge blood pressure medications included amlodipine 10 mg daily, benazepril 40 mg daily, clonidine 0.2 mg., Coreg 3.125 mg twice a day  stroke and admission to the hospital at the end of December 2014. She had acute onset left sided weakness including arm and leg. She reports that review of MRI showed acute stroke, as well as previous strokes. She was seen by neurology and aspirin changed to Plavix 75 mg daily. Hemoglobin A1c 8.5. Notes indicate short run of SVT while she was in the hospital and on the monitor.  having workup for [place]  syndrome Following her stroke, she had severe hypotension requiring treatment with midodrine and Florinef.  Echocardiogram 01/29/2013 suggest ejection fraction 40-45% CT scan of the head showing small vessel disease MRI showing acute infarct right parietal white matter, extensive chronic microvascular  ischemic changes  Echocardiogram April 2014 showed ejection fraction 50-55%, otherwise normal study Stress test 11/02/2012 showing no ischemia,   No Known Allergies  Outpatient Encounter Prescriptions as of 06/03/2015  Medication Sig  . aspirin EC 81 MG tablet Take 1 tablet (81 mg total) by mouth daily.  . baclofen (LIORESAL) 10 MG tablet Take 1 tablet (10 mg total) by mouth 2 (two) times daily as needed for muscle spasms.  . canagliflozin (INVOKANA) 100 MG TABS tablet Take 1 tablet (100 mg total) by mouth daily before breakfast.  . carvedilol (COREG) 25 MG tablet Take 1 tablet (25 mg total) by mouth 2 (two) times daily.  . citalopram (CELEXA) 40 MG tablet Take 1 tablet (40 mg total) by mouth daily.  . clopidogrel (PLAVIX) 75 MG tablet Take 1 tablet (75 mg total) by mouth daily.  Marland Kitchen diltiazem (CARDIZEM CD) 120 MG 24 hr capsule Take 1 capsule (120 mg total) by mouth daily.  . eszopiclone (LUNESTA) 2 MG TABS tablet Take 2 mg by mouth at bedtime as needed for sleep. Take immediately before bedtime  . isosorbide mononitrate (IMDUR) 30 MG 24 hr tablet Take 1 tablet (30 mg total) by mouth at bedtime.  . isosorbide mononitrate (IMDUR) 60 MG 24 hr tablet Take 1 tablet (60 mg total) by mouth daily after breakfast.  . levothyroxine (SYNTHROID, LEVOTHROID) 25 MCG tablet Take 1 tablet (25 mcg total) by mouth daily before breakfast.  . losartan (COZAAR) 25 MG tablet Take 1 tablet (25 mg total) by mouth daily.  . metFORMIN (GLUCOPHAGE) 500 MG tablet Take 2 tablets (1,000 mg total) by mouth at bedtime. START FROM 04/21/15  . Multiple Vitamin (MULTIVITAMIN WITH MINERALS) TABS tablet Take 1 tablet by mouth daily.  . nitroGLYCERIN (NITROSTAT) 0.4 MG SL tablet Place 1 tablet (0.4 mg total) under the tongue every 5 (five) minutes as needed for chest pain.  . pantoprazole (PROTONIX) 40 MG tablet Take 1 tablet (40 mg total) by mouth 2 (two) times daily.  . pregabalin (LYRICA) 100 MG capsule Take 1 capsule (100 mg  total) by mouth 2 (two) times daily.  . promethazine (PHENERGAN) 25 MG tablet Take 25 mg by mouth every 6 (six) hours as needed for nausea or vomiting. Reported on 05/31/2015  . ranolazine (RANEXA) 1000 MG SR tablet Take 1 tablet (1,000 mg total) by mouth 2 (two) times daily.  . rosuvastatin (CRESTOR) 40 MG tablet Take 1 tablet (40 mg total) by mouth daily at 6 PM.  . traZODone (DESYREL) 100 MG tablet Take 200 mg by mouth at bedtime.   No facility-administered encounter medications on file as of 06/03/2015.    Past Medical History  Diagnosis Date  . Colon polyp   . GERD (gastroesophageal reflux disease)   . CAD (coronary artery disease)     a. cath 08/09/2003: mild LAD plaque, no obs dzs noted; b. 12/18/10 stress test w/o ischemia; c. cath 11/2014: LM: nl, mid 85-90% s/p PCI/DES, mid LCX 30%, RCA nl d. cath 04/2015: 70% stenosis mid-diagonal at bifurcation. patent stent in mid-LAD. --> Medical management recommended  . CVA (cerebral vascular accident) ([place])     a. 06/2008-TEE nl LV fxn; b. 01/2013 - notes indicate short run of SVT at that time  .  Multiple sclerosis ([place]) 2015  . Syncope   . Chronic systolic CHF (congestive heart failure) ([place])     a. echo 01/29/2013 EF 40-45%, basal anteroseptal & mid anteroseptal segments abnl; b echo 05/2012 EF 50-55%, mildly dilated LA, nl RVSP  . Orthostatic hypotension   . 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 Ischemic cardiomyopathy   . MS (mitral stenosis)   . Malignant melanoma of skin of scalp ([place])   . Hypertension   . Depression   . Diabetes mellitus without complication ([place])   . Headache     Migrains  . Hyperlipidemia   . CKD (chronic kidney disease), stage IV ([place])   . Hypothyroidism   . Anxiety   . Gastric ulcer 04/27/2011    Past Surgical History  Procedure Laterality Date  . Appendectomy    . Right oophorectomy    . Tubal ligation      R tube removed still w/ Left  . Dilation and curettage of uterus    . Gastric bypass  09/2009    [facility]   . Esophagogastroduodenoscopy (egd) with propofol N/A 09/14/2014    Procedure: ESOPHAGOGASTRODUODENOSCOPY (EGD) WITH PROPOFOL;  Surgeon: Josefine Class, MD;  Location: [facility] ENDOSCOPY;  Service: Endoscopy;  Laterality: N/A;  . Cardiac catheterization N/A 11/09/2014    Procedure: Coronary Angiography;  Surgeon: Minna Merritts, MD;  Location: [place] CV LAB;  Service: Cardiovascular;  Laterality: N/A;  . Cardiac catheterization N/A 11/12/2014    Procedure: Coronary Stent Intervention;  Surgeon: Isaias Cowman, MD;  Location: [place] CV LAB;  Service: Cardiovascular;  Laterality: N/A;  . Trigger finger release    . Melanoma excision  2016    Dr. Evorn Gong  . Cardiac catheterization N/A 04/18/2015    Procedure: Left Heart Cath and Coronary Angiography;  Surgeon: Minna Merritts, MD;  Location: [place] CV LAB;  Service: Cardiovascular;  Laterality: N/A;    Social History  reports that she quit smoking about 20 years ago. Her smoking use included Cigarettes. She has never used smokeless tobacco. She reports that she does not drink alcohol or use illicit drugs.  Family History family history includes Anxiety disorder in her father and mother; Bipolar disorder in her mother; Colon cancer in her father; Depression in her father, mother, and sister; Diabetes in her father and sister; Heart disease in her father and mother; Hyperlipidemia in her mother and sister; Hypertension in her father, mother, sister, and sister; Kidney disease in her father and sister; Skin cancer in her father; Stroke in her father; Thyroid nodules in her sister.   Review of Systems  Constitutional: Positive for diaphoresis.  Respiratory: Negative.   Cardiovascular: Positive for chest pain.  Gastrointestinal: Negative.   Musculoskeletal: Negative.   Neurological: Negative.        Left arm, left leg weakness  Hematological: Negative.   All other systems reviewed and are  negative.   BP 128/80 mmHg  Pulse 119  Ht 5\' 3"  (1.6 m)  Wt 190 lb 12 oz (86.524 kg)  BMI 33.80 kg/m2  SpO2 98%  LMP  (Approximate)  Physical Exam  Constitutional: She is oriented to person, place, and time. She appears well-developed and well-nourished.  HENT:  Head: Normocephalic.  Nose: Nose normal.  Mouth/Throat: Oropharynx is clear and moist.  Eyes: Conjunctivae are normal. Pupils are equal, round, and reactive to light.  Neck: Normal range of motion. Neck supple. No JVD present.  Cardiovascular: Normal rate, regular rhythm,  S1 normal, S2 normal, normal heart sounds and intact distal pulses.  Exam reveals no gallop and no friction rub.   No murmur heard. Pulmonary/Chest: Effort normal and breath sounds normal. No respiratory distress. She has no wheezes. She has no rales. She exhibits no tenderness.  Abdominal: Soft. Bowel sounds are normal. She exhibits no distension. There is no tenderness.  Musculoskeletal: Normal range of motion. She exhibits no edema or tenderness.  Lymphadenopathy:    She has no cervical adenopathy.  Neurological: She is alert and oriented to person, place, and time. Coordination normal.  Skin: Skin is warm and dry. No rash noted. No erythema.  Psychiatric: She has a normal mood and affect. Her behavior is normal. Judgment and thought content normal.    Assessment and Plan  Nursing note and vitals reviewed.

## 2015-06-03 NOTE — ED Notes (Signed)
Pt sent from cardiologistoffice with c/o CP,. Notes changes in [place] from Friday when pt was seen here in the ED. Pt states she has had CP since last Tuesday.. States over the weekend she began having diaphoresis.[name]

## 2015-06-03 NOTE — Progress Notes (Signed)
ANTICOAGULATION CONSULT NOTE - Initial Consult  Pharmacy Consult for heparin drip Indication: chest pain/ACS  No Known Allergies  Patient Measurements: Height: 5\' 3"  (160 cm) Weight: 190 lb 11.2 oz (86.501 kg) IBW/kg (Calculated) : 52.4 Heparin Dosing Weight: 71.7 kg  Vital Signs: Temp: 98 F (36.7 C) (05/01 2023) Temp Source: Oral (05/01 2023) BP: 158/82 mmHg (05/01 2023) Pulse Rate: 59 (05/01 2023)  Labs:  Recent Labs  06/03/15 1246 06/03/15 1936  HGB 12.9  --   HCT 38.9  --   PLT 154  --   APTT 28  --   LABPROT 13.8  --   INR 1.04  --   HEPARINUNFRC  --  <0.10*  CREATININE 1.57*  --   TROPONINI 0.04*  --    Estimated Creatinine Clearance: 46.7 mL/min (by C-G formula based on Cr of 1.57).  Medical History: Past Medical History  Diagnosis Date  . Colon polyp   . GERD (gastroesophageal reflux disease)   . CAD (coronary artery disease)     a. cath 08/09/2003: mild LAD plaque, no obs dzs noted; b. 12/18/10 stress test w/o ischemia; c. cath 11/2014: LM: nl, mid 85-90% s/p PCI/DES, mid LCX 30%, RCA nl d. cath 04/2015: 70% stenosis mid-diagonal at bifurcation. patent stent in mid-LAD. --> Medical management recommended  . CVA (cerebral vascular accident) ([place])     a. 06/2008-TEE nl LV fxn; b. 01/2013 - notes indicate short run of SVT at that time  . Multiple sclerosis ([place]) 2015  . Syncope   . Chronic systolic CHF (congestive heart failure) ([place])     a. echo 01/29/2013 EF 40-45%, basal anteroseptal & mid anteroseptal segments abnl; b echo 05/2012 EF 50-55%, mildly dilated LA, nl RVSP  . Orthostatic hypotension   . 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 Ischemic cardiomyopathy   . MS (mitral stenosis)   . Malignant melanoma of skin of scalp ([place])   . Hypertension   . Depression   . Diabetes mellitus without complication ([place])   . Headache     Migrains  . Hyperlipidemia   . CKD (chronic kidney disease), stage IV ([place])   . Hypothyroidism   . Anxiety   . Gastric ulcer  04/27/2011   Assessment: Pharmacy consulted to dose and monitor heparin drip in this 45 year old female for chest pain/ACS. I spoke with patient regarding home medications and she denies taking and anticoagulants prior to admission. Baseline labs (protime INR, CBC, and aPTT) have been ordered.  Goal of Therapy:  Heparin level 0.3-0.7 units/ml Monitor platelets by anticoagulation protocol: Yes   Plan:  Give 4000 units bolus x 1 (~55 units/kg) Start heparin infusion at 850 units/hr (~12 units/kg/hr) Check anti-Xa level in 6 hours and daily while on heparin Continue to monitor H&H and platelets  5/1:  HL @ 19:30 = < 0.1  Will order Heparin 2150 units IV X 1 bolus and increase drip rate to 1150 units/hr. Will recheck HL 6 hrs after rate change.   Thank you for the consult.  Orene Desanctis, PharmD Clinical Pharmacist 06/03/2015,9:30 PM

## 2015-06-03 NOTE — Patient Instructions (Addendum)
  No medication changes were made.  We will get you to the ER for chest pain, unstable angina, new EKG changes You need to be started on heparin IV. Cardiac cath tomorrow 1:30 PM  Please call us if you have new issues that need to be addressed before your next appt.  Your physician wants you to follow-up in: 1 month.

## 2015-06-03 NOTE — Assessment & Plan Note (Addendum)
Worsening unstable angina symptoms this week , severe today with new EKG changes compared to EKG from last week.  New ST depression V5, V6, 1 and aVL  10 out of 10 chest pain in the exam room, relieved with nitroglycerin.  Given her unstable angina symptoms, new EKG abnormality , we'll sent to the emergency room for started heparin infusion.  Tentatively set up for catheterization tomorrow at 1:30 PM  If she develops worsening chest pain before then, may need to perform cardiac catheterization urgently.  Case discussed with Dr. Fletcher Anon.  She may need intervention on the small diagonal branch.  Discussed case with the emergency room in preparation

## 2015-06-03 NOTE — H&P (Signed)
[facility] - [place] at [facility]   PATIENT NAME: [name]    MR#:  [id number]  DATE OF BIRTH:  1970-09-04  DATE OF ADMISSION:  06/03/2015  PRIMARY CARE PHYSICIAN: Mila Merry, MD   REQUESTING/REFERRING PHYSICIAN: dr York Cerise   CHIEF COMPLAINT:   Chest pain on and off for last few days. HISTORY OF PRESENT ILLNESS:  [name]  is a 45 y.o. female with a known history of CVA, CAD status post stent in the remote past, recent cardiac catheter March 2017 showing moderate LAD disease, having percent diagonal disease small vessel near the ostium which was decided to be treated medically, type 2 diabetes, long-standing history of smoking in the past comes to the emergency room after she was seen at Dr Windell Hummingbird office and was found to have EKG changes with ST depression in V5 V6 and aVL and 1. Patient was sent to the emergency room. She received 5 nitroglycerin for chest pain is down from 10 out of 10-5 out of 10. She started on IV heparin drip is being admitted with unstable angina/NSTEMI.  PAST MEDICAL HISTORY:   Past Medical History  Diagnosis Date  . Colon polyp   . GERD (gastroesophageal reflux disease)   . CAD (coronary artery disease)     a. cath 08/09/2003: mild LAD plaque, no obs dzs noted; b. 12/18/10 stress test w/o ischemia; c. cath 11/2014: LM: nl, mid 85-90% s/p PCI/DES, mid LCX 30%, RCA nl d. cath 04/2015: 70% stenosis mid-diagonal at bifurcation. patent stent in mid-LAD. --> Medical management recommended  . CVA (cerebral vascular accident) (HCC)     a. 06/2008-TEE nl LV fxn; b. 01/2013 - notes indicate short run of SVT at that time  . Multiple sclerosis (HCC) 2015  . Syncope   . Chronic systolic CHF (congestive heart failure) (HCC)     a. echo 01/29/2013 EF 40-45%, basal anteroseptal & mid anteroseptal segments abnl; b echo 05/2012 EF 50-55%, mildly dilated LA, nl RVSP  . Orthostatic hypotension   . Aortic arch aneurysm (HCC)   .  Ischemic cardiomyopathy   . MS (mitral stenosis)   . Malignant melanoma of skin of scalp (HCC)   . Hypertension   . Depression   . Diabetes mellitus without complication (HCC)   . Headache     Migrains  . Hyperlipidemia   . CKD (chronic kidney disease), stage IV (HCC)   . Hypothyroidism   . Anxiety   . Gastric ulcer 04/27/2011    PAST SURGICAL HISTOIRY:   Past Surgical History  Procedure Laterality Date  . Appendectomy    . Right oophorectomy    . Tubal ligation      R tube removed still w/ Left  . Dilation and curettage of uterus    . Gastric bypass  09/2009    [facility]   . Esophagogastroduodenoscopy (egd) with propofol N/A 09/14/2014    Procedure: ESOPHAGOGASTRODUODENOSCOPY (EGD) WITH PROPOFOL;  Surgeon: Elnita Maxwell, MD;  Location: [facility] ENDOSCOPY;  Service: Endoscopy;  Laterality: N/A;  . Cardiac catheterization N/A 11/09/2014    Procedure: Coronary Angiography;  Surgeon: Antonieta Iba, MD;  Location: [facility] INVASIVE CV LAB;  Service: Cardiovascular;  Laterality: N/A;  . Cardiac catheterization N/A 11/12/2014    Procedure: Coronary Stent Intervention;  Surgeon: Marcina Millard, MD;  Location: [facility] INVASIVE CV LAB;  Service: Cardiovascular;  Laterality: N/A;  . Trigger finger release    . Melanoma excision  2016  Dr. Adolphus Birchwood  . Cardiac catheterization N/A 04/18/2015    Procedure: Left Heart Cath and Coronary Angiography;  Surgeon: Antonieta Iba, MD;  Location: [facility] INVASIVE CV LAB;  Service: Cardiovascular;  Laterality: N/A;    SOCIAL HISTORY:   Social History  Substance Use Topics  . Smoking status: Former Smoker    Types: Cigarettes    Quit date: 08/31/1994  . Smokeless tobacco: Never Used     Comment: quit 28 years ago  . Alcohol Use: No    FAMILY HISTORY:   Family History  Problem Relation Age of Onset  . Hypertension Mother   . Anxiety disorder Mother   . Depression Mother   . Bipolar disorder Mother   . Heart disease  Mother   . Hyperlipidemia Mother   . Kidney disease Father   . Heart disease Father   . Hypertension Father   . Diabetes Father   . Stroke Father   . Colon cancer Father     dx in his 69's  . Anxiety disorder Father   . Depression Father   . Skin cancer Father   . Kidney disease Sister   . Thyroid nodules Sister   . Hypertension Sister   . Hypertension Sister   . Diabetes Sister   . Hyperlipidemia Sister   . Depression Sister     DRUG ALLERGIES:  No Known Allergies  REVIEW OF SYSTEMS:  Review of Systems  Constitutional: Negative for fever, chills and weight loss.  HENT: Negative for ear discharge, ear pain and nosebleeds.   Eyes: Negative for blurred vision, pain and discharge.  Respiratory: Positive for shortness of breath. Negative for sputum production, wheezing and stridor.   Cardiovascular: Positive for chest pain. Negative for palpitations, orthopnea and PND.  Gastrointestinal: Negative for nausea, vomiting, abdominal pain and diarrhea.  Genitourinary: Negative for urgency and frequency.  Musculoskeletal: Negative for back pain and joint pain.  Neurological: Negative for sensory change, speech change, focal weakness and weakness.  Psychiatric/Behavioral: Negative for depression and hallucinations. The patient is not nervous/anxious.   All other systems reviewed and are negative.    MEDICATIONS AT HOME:   Prior to Admission medications   Medication Sig Start Date End Date Taking? Authorizing Provider  aspirin EC 81 MG tablet Take 1 tablet (81 mg total) by mouth daily. 02/19/15  Yes Jimmy Footman, MD  baclofen (LIORESAL) 10 MG tablet Take 1 tablet (10 mg total) by mouth 2 (two) times daily as needed for muscle spasms. 02/19/15  Yes Jimmy Footman, MD  canagliflozin [facility]) 100 MG TABS tablet Take 1 tablet (100 mg total) by mouth daily before breakfast. 02/22/15  Yes [name], MD  carvedilol (COREG) 25 MG tablet Take 1 tablet (25 mg  total) by mouth 2 (two) times daily. 04/18/15  Yes Enid Baas, MD  citalopram (CELEXA) 40 MG tablet Take 1 tablet (40 mg total) by mouth daily. 04/18/15  Yes Enid Baas, MD  clopidogrel (PLAVIX) 75 MG tablet Take 1 tablet (75 mg total) by mouth daily. 04/18/15  Yes Enid Baas, MD  diltiazem (CARDIZEM CD) 120 MG 24 hr capsule Take 1 capsule (120 mg total) by mouth daily. 04/18/15  Yes Enid Baas, MD  eszopiclone (LUNESTA) 2 MG TABS tablet Take 2 mg by mouth at bedtime as needed for sleep. Take immediately before bedtime   Yes Historical Provider, MD  isosorbide mononitrate (IMDUR) 30 MG 24 hr tablet Take 1 tablet (30 mg total) by mouth at bedtime. 05/31/15 05/30/16 Yes  Gayla Doss, MD  isosorbide mononitrate (IMDUR) 60 MG 24 hr tablet Take 1 tablet (60 mg total) by mouth daily after breakfast. 05/31/15  Yes Gayla Doss, MD  levothyroxine (SYNTHROID, LEVOTHROID) 25 MCG tablet Take 1 tablet (25 mcg total) by mouth daily before breakfast. 04/18/15  Yes Enid Baas, MD  losartan (COZAAR) 25 MG tablet Take 1 tablet (25 mg total) by mouth daily. 04/18/15  Yes Enid Baas, MD  metFORMIN (GLUCOPHAGE) 500 MG tablet Take 2 tablets (1,000 mg total) by mouth at bedtime. START FROM 04/21/15 04/18/15  Yes Enid Baas, MD  Multiple Vitamin (MULTIVITAMIN WITH MINERALS) TABS tablet Take 1 tablet by mouth daily.   Yes Historical Provider, MD  nitroGLYCERIN (NITROSTAT) 0.4 MG SL tablet Place 1 tablet (0.4 mg total) under the tongue every 5 (five) minutes as needed for chest pain. 04/30/15  Yes Antonieta Iba, MD  pantoprazole (PROTONIX) 40 MG tablet Take 1 tablet (40 mg total) by mouth 2 (two) times daily. 04/18/15  Yes Enid Baas, MD  pregabalin (LYRICA) 100 MG capsule Take 1 capsule (100 mg total) by mouth 2 (two) times daily. 01/03/15  Yes [name], MD  promethazine (PHENERGAN) 25 MG tablet Take 25 mg by mouth every 6 (six) hours as needed for nausea or vomiting.  Reported on 05/31/2015   Yes Historical Provider, MD  ranolazine (RANEXA) 1000 MG SR tablet Take 1 tablet (1,000 mg total) by mouth 2 (two) times daily. 05/31/15  Yes Antonieta Iba, MD  rosuvastatin (CRESTOR) 40 MG tablet Take 1 tablet (40 mg total) by mouth daily at 6 PM. 04/30/15  Yes Antonieta Iba, MD  traZODone (DESYREL) 100 MG tablet Take 200 mg by mouth at bedtime.   Yes Historical Provider, MD      VITAL SIGNS:  Blood pressure 178/74, pulse 62, temperature 98.1 F (36.7 C), temperature source Oral, resp. rate 19, height 5\' 3"  (1.6 m), weight 86.183 kg (190 lb), SpO2 100 %.  PHYSICAL EXAMINATION:  GENERAL:  45 y.o.-year-old patient lying in the bed with no acute distress.  EYES: Pupils equal, round, reactive to light and accommodation. No scleral icterus. Extraocular muscles intact.  HEENT: Head atraumatic, normocephalic. Oropharynx and nasopharynx clear.  NECK:  Supple, no jugular venous distention. No thyroid enlargement, no tenderness.  LUNGS: Normal breath sounds bilaterally, no wheezing, rales,rhonchi or crepitation. No use of accessory muscles of respiration.  CARDIOVASCULAR: S1, S2 normal. No murmurs, rubs, or gallops.  ABDOMEN: Soft, nontender, nondistended. Bowel sounds present. No organomegaly or mass.  EXTREMITIES: No pedal edema, cyanosis, or clubbing.  NEUROLOGIC: Cranial nerves II through XII are intact. Muscle strength 5/5 in all extremities. Sensation intact. Gait not checked.  PSYCHIATRIC: The patient is alert and oriented x 3.  SKIN: No obvious rash, lesion, or ulcer.   LABORATORY PANEL:   CBC  Recent Labs Lab 06/03/15 1246  WBC 11.0  HGB 12.9  HCT 38.9  PLT 154   ------------------------------------------------------------------------------------------------------------------  Chemistries   Recent Labs Lab 06/03/15 1246  NA 140  K 4.3  CL 108  CO2 22  GLUCOSE 164*  BUN 21*  CREATININE 1.57*  CALCIUM 9.4  MG 1.8  AST 23  ALT 24  ALKPHOS  90  BILITOT 0.5   ------------------------------------------------------------------------------------------------------------------  Cardiac Enzymes  Recent Labs Lab 06/03/15 1246  TROPONINI 0.04*   ------------------------------------------------------------------------------------------------------------------  RADIOLOGY:  No results found.  EKG:   Normal sinus rhythm with no acute ST elevation or depression Patient's EKG at Dr.  Gollan's office earlier showed ST depression in V5 and V6 1 and aVL IMPRESSION AND PLAN:   [name]  is a 45 y.o. female with a known history of CVA, CAD status post stent in the remote past, recent cardiac catheter March 2017 showing moderate LAD disease, having percent diagonal disease small vessel near the ostium which was decided to be treated medically, type 2 diabetes, long-standing history of smoking in the past comes to the emergency room after she was seen at Dr Windell Hummingbird office and was found to have EKG changes with ST depression in V5 V6 and aVL and 1.  1. Acute non-Q-wave MI/unstable angina -Patient had new EKG changes at cardiology office with ST depression in V5 and V6 aVL and 1 -She is being admitted on medical floor/telemetry -IV heparin drip -Nitroglycerin ointment -Continue aspirin and Plavix diltiazem, imdur, losartan, Ranexa, Crestor, Coreg -Cardiology consultation. Patient is scheduled to have cardiac catheter tomorrow  2. Hypertension continue home dose antihypertensives  3. Type 2 diabetes sliding scale insulin and home meds. Hold off on metformin given cardiac catheterization being scheduled  4. Hyperlipidemia on Crestor  5. DVT prophylaxis patient already on IV heparin    All the records are reviewed and case discussed with ED provider. Management plans discussed with the patient, family and they are in agreement.  CODE STATUS: Full  TOTAL TIME TAKING CARE OF THIS PATIENT: 45 minutes.    Shamond Skelton M.D on  06/03/2015 at 3:16 PM  Between 7am to 6pm - Pager - [phone]  After 6pm go to [email or web address] - password [email or web address] [facility]  [facility]  Office  [phone]  CC: Primary care physician; Mila Merry, MD

## 2015-06-03 NOTE — ED Provider Notes (Signed)
[facility] Emergency Department Provider Note  ____________________________________________  Time seen: Approximately 12:32 PM  I have reviewed the triage vital signs and the nursing notes.   HISTORY  Chief Complaint Chest Pain    HPI [name] is a 45 y.o. female with past medical history that includes CVA, hypertension, diabetes, CAD with a cardiac catheterization about 6 weeks ago who presents directly from her cardiologist's office (Dr. Esmond Plants) with concern of ongoing unstable angina with EKG changes.  After Gollan called and spoke with me by phone to let me know if his concerns.  In short, the patient was seen several days ago in the emergency department and had 2 negative troponins and a reassuring EKG so outpatient follow-up with Dr. [name] was scheduled.  However, when the patient followed up today in the office she has severe chest pain, only minimally relieved after 5 nitroglycerin, and she has EKG changes as documented by Dr. [name] with ST depression in the lateral leads.  I brought up the possibility of direct admission to the hospital with Dr. [name], but he pointed out that he is concerned she has active unstable angina at a minimum at this time if not NSTEMI and that she would be best served in the emergency department for emergent labs and intervention if needed.  I evaluated the patient within minutes of her being placed in 17H. she said that her pain is slightly improved to 5 out of 10 but it is still persistent and located in the center of her chest.  At this time she has no shortness of breath although she occasionally does when the pain is severe.  Nothing in particular makes it worse and it does occur at rest.  It is severe at its worst and currently moderate.  She denies nausea, vomiting, abdominal pain, dysuria.  She frequently has diaphoresis now along with the chest pain which she did not really sleep.   Past Medical History    Diagnosis Date  . Colon polyp   . GERD (gastroesophageal reflux disease)   . CAD (coronary artery disease)     a. cath 08/09/2003: mild LAD plaque, no obs dzs noted; b. 12/18/10 stress test w/o ischemia; c. cath 11/2014: LM: nl, mid 85-90% s/p PCI/DES, mid LCX 30%, RCA nl d. cath 04/2015: 70% stenosis mid-diagonal at bifurcation. patent stent in mid-LAD. --> Medical management recommended  . CVA (cerebral vascular accident) ([place])     a. 06/2008-TEE nl LV fxn; b. 01/2013 - notes indicate short run of SVT at that time  . Multiple sclerosis ([place]) 2015  . Syncope   . Chronic systolic CHF (congestive heart failure) ([place])     a. echo 01/29/2013 EF 40-45%, basal anteroseptal & mid anteroseptal segments abnl; b echo 05/2012 EF 50-55%, mildly dilated LA, nl RVSP  . Orthostatic hypotension   . Aortic arch aneurysm ([place])   . Ischemic cardiomyopathy   . MS (mitral stenosis)   . Malignant melanoma of skin of scalp ([place])   . Hypertension   . Depression   . Diabetes mellitus without complication ([place])   . Headache     Migrains  . Hyperlipidemia   . CKD (chronic kidney disease), stage IV ([place])   . Hypothyroidism   . Anxiety   . Gastric ulcer 04/27/2011    Patient Active Problem List   Diagnosis Date Noted  . Carotid stenosis 04/30/2015  . Type 2 diabetes mellitus with complication, without long-term current use of  insulin ([place])   . Pain in the chest   . Urinary tract infection 04/05/2015  . Iron deficiency anemia 03/22/2015  . Depression 02/22/2015  . Diabetes type 2, controlled ([place]) 02/19/2015  . Vitamin B12 deficiency 02/18/2015  . Non compliance w medication regimen 02/18/2015  . Misuse of medications for pain 02/18/2015  . Chest pain 02/16/2015  . Severe recurrent major depression without psychotic features ([place]) 02/15/2015  . Helicobacter pylori infection 11/23/2014  . Hemiparesis, left ([place]) 11/23/2014  . Benign neoplasm of colon 11/20/2014  . Coronary artery disease involving native  coronary artery of native heart with unstable angina pectoris ([place])   . Malignant melanoma ([place]) 08/25/2014  . Chronic kidney disease (CKD), stage III (moderate) 08/25/2014  . Chronic systolic CHF (congestive heart failure) ([place])   . Incomplete bladder emptying 07/12/2014  . Adult hypothyroidism 12/30/2013  . Aberrant subclavian artery 11/17/2013  . Multiple sclerosis ([place]) 11/02/2013  . Cerebrovascular accident (CVA) ([place]) 06/20/2013  . Headache, migraine 05/29/2013  . Diabetes mellitus ([place]) 05/29/2013  . Type 2 diabetes mellitus ([place]) 12/12/2012  . Hyperlipidemia   . GERD (gastroesophageal reflux disease)   . Neuropathy ([place]) 01/02/2011  . CVA (cerebral vascular accident) ([place]) 06/21/2008  . Essential hypertension 05/01/2008    Past Surgical History  Procedure Laterality Date  . Appendectomy    . Right oophorectomy    . Tubal ligation      R tube removed still w/ Left  . Dilation and curettage of uterus    . Gastric bypass  09/2009    [facility]   . Esophagogastroduodenoscopy (egd) with propofol N/A 09/14/2014    Procedure: ESOPHAGOGASTRODUODENOSCOPY (EGD) WITH PROPOFOL;  Surgeon: Josefine Class, MD;  Location: [facility] ENDOSCOPY;  Service: Endoscopy;  Laterality: N/A;  . Cardiac catheterization N/A 11/09/2014    Procedure: Coronary Angiography;  Surgeon: Minna Merritts, MD;  Location: [facility];  Service: Cardiovascular;  Laterality: N/A;  . Cardiac catheterization N/A 11/12/2014    Procedure: Coronary Stent Intervention;  Surgeon: Isaias Cowman, MD;  Location: [facility];  Service: Cardiovascular;  Laterality: N/A;  . Trigger finger release    . Melanoma excision  2016    Dr. Evorn Gong  . Cardiac catheterization N/A 04/18/2015    Procedure: Left Heart Cath and Coronary Angiography;  Surgeon: Minna Merritts, MD;  Location: [facility];  Service: Cardiovascular;  Laterality: N/A;    Current Outpatient Rx  Name  Route   Sig  Dispense  Refill  . aspirin EC 81 MG tablet   Oral   Take 1 tablet (81 mg total) by mouth daily.         . baclofen (LIORESAL) 10 MG tablet   Oral   Take 1 tablet (10 mg total) by mouth 2 (two) times daily as needed for muscle spasms.      0   . canagliflozin (INVOKANA) 100 MG TABS tablet   Oral   Take 1 tablet (100 mg total) by mouth daily before breakfast.   30 tablet   5   . carvedilol (COREG) 25 MG tablet   Oral   Take 1 tablet (25 mg total) by mouth 2 (two) times daily.   60 tablet   6   . citalopram (CELEXA) 40 MG tablet   Oral   Take 1 tablet (40 mg total) by mouth daily.   30 tablet   3   . clopidogrel (PLAVIX) 75 MG tablet  Oral   Take 1 tablet (75 mg total) by mouth daily.   30 tablet   5   . diltiazem (CARDIZEM CD) 120 MG 24 hr capsule   Oral   Take 1 capsule (120 mg total) by mouth daily.   30 capsule   5   . eszopiclone (LUNESTA) 2 MG TABS tablet   Oral   Take 2 mg by mouth at bedtime as needed for sleep. Take immediately before bedtime         . isosorbide mononitrate (IMDUR) 30 MG 24 hr tablet   Oral   Take 1 tablet (30 mg total) by mouth at bedtime.   15 tablet   0   . isosorbide mononitrate (IMDUR) 60 MG 24 hr tablet   Oral   Take 1 tablet (60 mg total) by mouth daily after breakfast.   30 tablet   5   . levothyroxine (SYNTHROID, LEVOTHROID) 25 MCG tablet   Oral   Take 1 tablet (25 mcg total) by mouth daily before breakfast.   30 tablet   5   . losartan (COZAAR) 25 MG tablet   Oral   Take 1 tablet (25 mg total) by mouth daily.   30 tablet   5   . metFORMIN (GLUCOPHAGE) 500 MG tablet   Oral   Take 2 tablets (1,000 mg total) by mouth at bedtime. START FROM 04/21/15   60 tablet   2   . Multiple Vitamin (MULTIVITAMIN WITH MINERALS) TABS tablet   Oral   Take 1 tablet by mouth daily.         . nitroGLYCERIN (NITROSTAT) 0.4 MG SL tablet   Sublingual   Place 1 tablet (0.4 mg total) under the tongue every 5 (five)  minutes as needed for chest pain.   25 tablet   6   . pantoprazole (PROTONIX) 40 MG tablet   Oral   Take 1 tablet (40 mg total) by mouth 2 (two) times daily.   60 tablet   5   . pregabalin (LYRICA) 100 MG capsule   Oral   Take 1 capsule (100 mg total) by mouth 2 (two) times daily.   60 capsule   5   . promethazine (PHENERGAN) 25 MG tablet   Oral   Take 25 mg by mouth every 6 (six) hours as needed for nausea or vomiting. Reported on 05/31/2015         . ranolazine (RANEXA) 1000 MG SR tablet   Oral   Take 1 tablet (1,000 mg total) by mouth 2 (two) times daily.   60 tablet   6   . rosuvastatin (CRESTOR) 40 MG tablet   Oral   Take 1 tablet (40 mg total) by mouth daily at 6 PM.   90 tablet   3   . traZODone (DESYREL) 100 MG tablet   Oral   Take 200 mg by mouth at bedtime.           Allergies Review of patient's allergies indicates no known allergies.  Family History  Problem Relation Age of Onset  . Hypertension Mother   . Anxiety disorder Mother   . Depression Mother   . Bipolar disorder Mother   . Heart disease Mother   . Hyperlipidemia Mother   . Kidney disease Father   . Heart disease Father   . Hypertension Father   . Diabetes Father   . Stroke Father   . Colon cancer Father     dx in his  79's  . Anxiety disorder Father   . Depression Father   . Skin cancer Father   . Kidney disease Sister   . Thyroid nodules Sister   . Hypertension Sister   . Hypertension Sister   . Diabetes Sister   . Hyperlipidemia Sister   . Depression Sister     Social History Social History  Substance Use Topics  . Smoking status: Former Smoker    Types: Cigarettes    Quit date: 08/31/1994  . Smokeless tobacco: Never Used     Comment: quit 28 years ago  . Alcohol Use: No    Review of Systems Constitutional: No fever/chills Eyes: No visual changes. ENT: No sore throat. Cardiovascular: Severe chest pain, slightly better now Respiratory: Denies shortness of  breath. Gastrointestinal: No abdominal pain.  No nausea, no vomiting.  No diarrhea.  No constipation. Genitourinary: Negative for dysuria. Musculoskeletal: Negative for back pain. Skin: Negative for rash. Neurological: Negative for headaches, focal weakness or numbness.  10-point ROS otherwise negative.  ____________________________________________   PHYSICAL EXAM:  VITAL SIGNS: ED Triage Vitals  Enc Vitals Group     BP 06/03/15 1229 169/88 mmHg     Pulse Rate 06/03/15 1229 87     Resp 06/03/15 1229 18     Temp 06/03/15 1229 98.6 F (37 C)     Temp Source 06/03/15 1229 Oral     SpO2 06/03/15 1229 97 %     Weight 06/03/15 1229 190 lb (86.183 kg)     Height 06/03/15 1229 5\' 3"  (1.6 m)     Head Cir --      Peak Flow --      Pain Score 06/03/15 1230 5     Pain Loc --      Pain Edu? --      Excl. in [place]? --     Constitutional: Alert and oriented. Well appearing and in no acute distress but does appear uncomfortable. Eyes: Conjunctivae are normal. PERRL. EOMI. Head: Atraumatic. Nose: No congestion/rhinnorhea. Mouth/Throat: Mucous membranes are moist.  Oropharynx non-erythematous. Neck: No stridor.  No meningeal signs.   Cardiovascular: Normal rate, regular rhythm. Good peripheral circulation. Grossly normal heart sounds.   Respiratory: Normal respiratory effort.  No retractions. Lungs CTAB. Gastrointestinal: Soft and nontender. No distention.  Musculoskeletal: No lower extremity tenderness nor edema. No gross deformities of extremities. Neurologic:  Normal speech and language. No gross focal neurologic deficits are appreciated.  Skin:  Skin is warm, dry and intact. No rash noted. Psychiatric: Mood and affect are normal. Speech and behavior are normal.  ____________________________________________   LABS (all labs ordered are listed, but only abnormal results are displayed)  Labs Reviewed  CBC - Abnormal; Notable for the following:    MCV 76.9 (*)    MCH 25.6 (*)     RDW 22.0 (*)    All other components within normal limits  COMPREHENSIVE METABOLIC PANEL - Abnormal; Notable for the following:    Glucose, Bld 164 (*)    BUN 21 (*)    Creatinine, Ser 1.57 (*)    GFR calc non Af Amer 39 (*)    GFR calc Af Amer 45 (*)    All other components within normal limits  TROPONIN I - Abnormal; Notable for the following:    Troponin I 0.04 (*)    All other components within normal limits  APTT  MAGNESIUM  PROTIME-INR  HEPARIN LEVEL (UNFRACTIONATED)   ____________________________________________  EKG  ED ECG REPORT I, Chelby Salata,  Ellington Cornia, the attending physician, personally viewed and interpreted this ECG.  Date: 06/03/2015 EKG Time: 12:22 Rate: 86 Rhythm: normal sinus rhythm QRS Axis: normal Intervals: normal ST/T Wave abnormalities: normal Conduction Disturbances: none Narrative Interpretation: unremarkable  ____________________________________________  RADIOLOGY   No results found.  ____________________________________________   PROCEDURES  Procedure(s) performed: None  Critical Care performed: Yes, see critical care note(s)   CRITICAL CARE Performed by: Hinda Kehr   Total critical care time: 30 minutes  Critical care time was exclusive of separately billable procedures and treating other patients.  Critical care was necessary to treat or prevent imminent or life-threatening deterioration.  Critical care was time spent personally by me on the following activities: development of treatment plan with patient and/or surrogate as well as nursing, discussions with consultants, evaluation of patient's response to treatment, examination of patient, obtaining history from patient or surrogate, ordering and performing treatments and interventions, ordering and review of laboratory studies, ordering and review of radiographic studies, pulse oximetry and re-evaluation of patient's  condition.  ____________________________________________   INITIAL IMPRESSION / ASSESSMENT AND PLAN / ED COURSE  Pertinent labs & imaging results that were available during my care of the patient were reviewed by me and considered in my medical decision making (see chart for details).  I have ordered heparin with bolus plus infusion as well as a full dose aspirin (the patient only had an 81 mg earlier today) as per Dr. Donivan Scull recommendations.  As soon as I receive back her enzyme results I will contact the hospitalist for admission.  The ST depression seen at Dr. Donivan Scull office on the EKG has resolved, likely associated with her pain improving from a 10 to a 5.  I instructed the patient to let us know if her pain gets worse and we will repeat an EKG.  She is stable at this point  ----------------------------------------- 1:48 PM on 06/03/2015 -----------------------------------------  Patient's pain has improved after morphine.  Troponin very slightly elevated at 0.04, by consistent with NSTEMI.  Will admit as per plan.  Discussed everything with the hospitalist including the cardiologist's plan for catheterization tomorrow.  No reason to keep the patient nothing by mouth at this time given the plan for intervention tomorrow. ____________________________________________  FINAL CLINICAL IMPRESSION(S) / ED DIAGNOSES  Final diagnoses:  NSTEMI (non-ST elevated myocardial infarction) ([place])     MEDICATIONS GIVEN AND/OR PRESCRIBED DURING THIS VISIT:  Medications  heparin ADULT infusion 100 units/mL (25000 units/250 mL) (850 Units/hr Intravenous New Bag/Given 06/03/15 1332)  heparin 5000 UNIT/ML injection (0 Units  Not Given 06/03/15 1320)  aspirin chewable tablet 324 mg (324 mg Oral Given 06/03/15 1311)  morphine 4 MG/ML injection 4 mg (4 mg Intravenous Given 06/03/15 1312)  ondansetron (ZOFRAN) injection 4 mg (4 mg Intravenous Given 06/03/15 1312)  heparin bolus via infusion 4,000 Units (4,000  Units Intravenous Given 06/03/15 1333)     NEW OUTPATIENT MEDICATIONS STARTED DURING THIS VISIT:  New Prescriptions   No medications on file      Note:  This document was prepared using Dragon voice recognition software and may include unintentional dictation errors.   Hinda Kehr, MD 06/03/15 1349

## 2015-06-03 NOTE — Assessment & Plan Note (Signed)
Encouraged her to stay on her Crestor 40 mg daily

## 2015-06-03 NOTE — Progress Notes (Addendum)
ANTICOAGULATION CONSULT NOTE - Initial Consult  Pharmacy Consult for heparin drip Indication: chest pain/ACS  No Known Allergies  Patient Measurements: Height: 5\' 3"  (160 cm) Weight: 190 lb (86.183 kg) IBW/kg (Calculated) : 52.4 Heparin Dosing Weight: 71.7 kg  Vital Signs: Temp: 98.6 F (37 C) (05/01 1229) Temp Source: Oral (05/01 1229) BP: 169/88 mmHg (05/01 1229) Pulse Rate: 87 (05/01 1229)  Labs:  Recent Labs  05/31/15 1345  TROPONINI <0.03   Estimated Creatinine Clearance: 47.8 mL/min (by C-G formula based on Cr of 1.53).  Medical History: Past Medical History  Diagnosis Date  . Colon polyp   . GERD (gastroesophageal reflux disease)   . CAD (coronary artery disease)     a. cath 08/09/2003: mild LAD plaque, no obs dzs noted; b. 12/18/10 stress test w/o ischemia; c. cath 11/2014: LM: nl, mid 85-90% s/p PCI/DES, mid LCX 30%, RCA nl d. cath 04/2015: 70% stenosis mid-diagonal at bifurcation. patent stent in mid-LAD. --> Medical management recommended  . CVA (cerebral vascular accident) ([place])     a. 06/2008-TEE nl LV fxn; b. 01/2013 - notes indicate short run of SVT at that time  . Multiple sclerosis ([place]) 2015  . Syncope   . Chronic systolic CHF (congestive heart failure) ([place])     a. echo 01/29/2013 EF 40-45%, basal anteroseptal & mid anteroseptal segments abnl; b echo 05/2012 EF 50-55%, mildly dilated LA, nl RVSP  . Orthostatic hypotension   . Aortic arch aneurysm ([place])   . Ischemic cardiomyopathy   . MS (mitral stenosis)   . Malignant melanoma of skin of scalp ([place])   . Hypertension   . Depression   . Diabetes mellitus without complication ([place])   . Headache     Migrains  . Hyperlipidemia   . CKD (chronic kidney disease), stage IV ([place])   . Hypothyroidism   . Anxiety   . Gastric ulcer 04/27/2011   Assessment: Pharmacy consulted to dose and monitor heparin drip in this 45 year old female for chest pain/ACS. I spoke with patient regarding home medications and she  denies taking and anticoagulants prior to admission. Baseline labs (protime INR, CBC, and aPTT) have been ordered.  Goal of Therapy:  Heparin level 0.3-0.7 units/ml Monitor platelets by anticoagulation protocol: Yes   Plan:  Give 4000 units bolus x 1 (~55 units/kg) Start heparin infusion at 850 units/hr (~12 units/kg/hr) Check anti-Xa level in 6 hours and daily while on heparin Continue to monitor H&H and platelets  Thank you for the consult.  Lenis Noon, PharmD Clinical Pharmacist 06/03/2015,1:06 PM

## 2015-06-04 ENCOUNTER — Encounter
Admission: EM | Disposition: A | Discharge status: Home / Self Care | Payer: Self-pay | Source: Home / Self Care | Attending: Internal Medicine

## 2015-06-04 DIAGNOSIS — I2510 Atherosclerotic heart disease of native coronary artery without angina pectoris: Secondary | ICD-10-CM

## 2015-06-04 DIAGNOSIS — E1165 Type 2 diabetes mellitus with hyperglycemia: Secondary | ICD-10-CM

## 2015-06-04 DIAGNOSIS — E785 Hyperlipidemia, unspecified: Secondary | ICD-10-CM

## 2015-06-04 DIAGNOSIS — I214 Non-ST elevation (NSTEMI) myocardial infarction: Secondary | ICD-10-CM | POA: Diagnosis present

## 2015-06-04 DIAGNOSIS — I200 Unstable angina: Secondary | ICD-10-CM

## 2015-06-04 DIAGNOSIS — E1159 Type 2 diabetes mellitus with other circulatory complications: Secondary | ICD-10-CM

## 2015-06-04 DIAGNOSIS — Z955 Presence of coronary angioplasty implant and graft: Secondary | ICD-10-CM

## 2015-06-04 DIAGNOSIS — IMO0002 Reserved for concepts with insufficient information to code with codable children: Secondary | ICD-10-CM | POA: Diagnosis present

## 2015-06-04 DIAGNOSIS — Z9884 Bariatric surgery status: Secondary | ICD-10-CM

## 2015-06-04 HISTORY — PX: CARDIAC CATHETERIZATION: SHX172

## 2015-06-04 HISTORY — DX: Presence of coronary angioplasty implant and graft: Z95.5

## 2015-06-04 LAB — URINE DRUG SCREEN, QUALITATIVE (ARMC ONLY)
AMPHETAMINES, UR SCREEN: NOT DETECTED
Barbiturates, Ur Screen: NOT DETECTED
Benzodiazepine, Ur Scrn: NOT DETECTED
COCAINE METABOLITE, UR ~~LOC~~: NOT DETECTED
Cannabinoid 50 Ng, Ur ~~LOC~~: NOT DETECTED
MDMA (ECSTASY) UR SCREEN: NOT DETECTED
METHADONE SCREEN, URINE: NOT DETECTED
OPIATE, UR SCREEN: POSITIVE — AB
PHENCYCLIDINE (PCP) UR S: NOT DETECTED
Tricyclic, Ur Screen: NOT DETECTED

## 2015-06-04 LAB — TROPONIN I
TROPONIN I: 0.04 ng/mL — AB (ref <0.031)
Troponin I: 0.04 ng/mL — ABNORMAL HIGH (ref <0.031)

## 2015-06-04 LAB — GLUCOSE, CAPILLARY
GLUCOSE-CAPILLARY: 273 mg/dL — AB (ref 65–99)
Glucose-Capillary: 131 mg/dL — ABNORMAL HIGH (ref 65–99)
Glucose-Capillary: 155 mg/dL — ABNORMAL HIGH (ref 65–99)

## 2015-06-04 LAB — HEPARIN LEVEL (UNFRACTIONATED): Heparin Unfractionated: 0.17 IU/mL — ABNORMAL LOW (ref 0.30–0.70)

## 2015-06-04 SURGERY — LEFT HEART CATH AND CORONARY ANGIOGRAPHY
Anesthesia: Moderate Sedation

## 2015-06-04 MED ORDER — ASPIRIN 81 MG PO CHEW
CHEWABLE_TABLET | ORAL | Status: DC | PRN
  Administered 2015-06-04: 243 mg via ORAL

## 2015-06-04 MED ORDER — FENTANYL CITRATE (PF) 100 MCG/2ML IJ SOLN
INTRAMUSCULAR | Status: DC | PRN
  Administered 2015-06-04 (×2): 25 ug via INTRAVENOUS
  Administered 2015-06-04: 50 ug via INTRAVENOUS

## 2015-06-04 MED ORDER — HEPARIN (PORCINE) IN NACL 2-0.9 UNIT/ML-% IJ SOLN
INTRAMUSCULAR | Status: AC
  Filled 2015-06-04: qty 500

## 2015-06-04 MED ORDER — MIDAZOLAM HCL 2 MG/2ML IJ SOLN
INTRAMUSCULAR | Status: AC
  Filled 2015-06-04: qty 2

## 2015-06-04 MED ORDER — SODIUM CHLORIDE 0.9% FLUSH: 3.0000 mL | INTRAVENOUS | Status: DC | PRN

## 2015-06-04 MED ORDER — SODIUM CHLORIDE 0.9% FLUSH
3.0000 mL | Freq: Two times a day (BID) | INTRAVENOUS | Status: DC
  Administered 2015-06-05: 3 mL via INTRAVENOUS

## 2015-06-04 MED ORDER — SODIUM CHLORIDE 0.9 % WEIGHT BASED INFUSION
1.0000 mL/kg/h | INTRAVENOUS | Status: DC
Start: 2015-06-05 — End: 2015-06-04

## 2015-06-04 MED ORDER — SODIUM CHLORIDE 0.9 % IV SOLN: 250.0000 mL | INTRAVENOUS | Status: DC | PRN

## 2015-06-04 MED ORDER — VERAPAMIL HCL 2.5 MG/ML IV SOLN
INTRAVENOUS | Status: DC | PRN
  Administered 2015-06-04: 2.5 mg via INTRA_ARTERIAL

## 2015-06-04 MED ORDER — NITROGLYCERIN 1 MG/10 ML FOR IR/CATH LAB
INTRA_ARTERIAL | Status: DC | PRN
  Administered 2015-06-04 (×3): 300 ug via INTRACORONARY

## 2015-06-04 MED ORDER — ASPIRIN 81 MG PO CHEW: 81.0000 mg | CHEWABLE_TABLET | ORAL | Status: DC

## 2015-06-04 MED ORDER — FENTANYL CITRATE (PF) 100 MCG/2ML IJ SOLN
INTRAMUSCULAR | Status: AC
  Filled 2015-06-04: qty 2

## 2015-06-04 MED ORDER — HEPARIN SODIUM (PORCINE) 1000 UNIT/ML IJ SOLN
INTRAMUSCULAR | Status: DC | PRN
  Administered 2015-06-04: 4000 [IU] via INTRAVENOUS
  Administered 2015-06-04: 3000 [IU] via INTRAVENOUS

## 2015-06-04 MED ORDER — SODIUM CHLORIDE 0.9 % WEIGHT BASED INFUSION
3.0000 mL/kg/h | INTRAVENOUS | Status: DC
Start: 2015-06-05 — End: 2015-06-04

## 2015-06-04 MED ORDER — SODIUM CHLORIDE 0.9 % IV SOLN
INTRAVENOUS | Status: AC
  Administered 2015-06-04: 19:00:00 via INTRAVENOUS

## 2015-06-04 MED ORDER — HEPARIN SODIUM (PORCINE) 1000 UNIT/ML IJ SOLN
INTRAMUSCULAR | Status: AC
  Filled 2015-06-04: qty 1

## 2015-06-04 MED ORDER — NITROGLYCERIN 5 MG/ML IV SOLN
INTRAVENOUS | Status: AC
  Filled 2015-06-04: qty 10

## 2015-06-04 MED ORDER — MIDAZOLAM HCL 2 MG/2ML IJ SOLN
INTRAMUSCULAR | Status: DC | PRN
  Administered 2015-06-04 (×2): 1 mg via INTRAVENOUS

## 2015-06-04 MED ORDER — VERAPAMIL HCL 2.5 MG/ML IV SOLN
INTRAVENOUS | Status: AC
  Filled 2015-06-04: qty 2

## 2015-06-04 MED ORDER — IOPAMIDOL (ISOVUE-300) INJECTION 61%
INTRAVENOUS | Status: DC | PRN
  Administered 2015-06-04: 195 mL via INTRA_ARTERIAL

## 2015-06-04 MED ORDER — ASPIRIN 81 MG PO CHEW
CHEWABLE_TABLET | ORAL | Status: AC
  Filled 2015-06-04: qty 3

## 2015-06-04 MED ORDER — HEPARIN BOLUS VIA INFUSION
2200.0000 [IU] | Freq: Once | INTRAVENOUS | Status: AC
  Administered 2015-06-04: 2200 [IU] via INTRAVENOUS
  Filled 2015-06-04: qty 2200

## 2015-06-04 SURGICAL SUPPLY — 14 items
BALLN MINITREK RX 2.0X12 (BALLOONS) ×4
BALLOON MINITREK RX 2.0X12 (BALLOONS) IMPLANT
CATH HEARTRAIL 6F IL3.5 (CATHETERS) ×2 IMPLANT
CATH INFINITI 5 FR JL3.5 (CATHETERS) ×2 IMPLANT
CATH OPTITORQUE JACKY 4.0 5F (CATHETERS) ×6 IMPLANT
DEVICE INFLAT 30 PLUS (MISCELLANEOUS) ×2 IMPLANT
DEVICE RAD TR BAND REGULAR (VASCULAR PRODUCTS) ×3 IMPLANT
GLIDESHEATH SLEND SS 6F .021 (SHEATH) ×4 IMPLANT
KIT MANI 3VAL PERCEP (MISCELLANEOUS) ×4 IMPLANT
PACK CARDIAC CATH (CUSTOM PROCEDURE TRAY) ×4 IMPLANT
STENT RESOLUTE INTEG 2.25X12 (Permanent Stent) ×2 IMPLANT
WIRE HITORQ VERSACORE ST 145CM (WIRE) ×2 IMPLANT
WIRE RUNTHROUGH .014X180CM (WIRE) ×4 IMPLANT
WIRE SAFE-T 1.5MM-J .035X260CM (WIRE) ×4 IMPLANT

## 2015-06-04 NOTE — Interval H&P Note (Signed)
History and Physical Interval Note:  06/04/2015 2:35 PM  [name]  has presented today for surgery, with the diagnosis of IN PT Rm [id number]    Chest Pain SW [facility] b4 sched date and time  The various methods of treatment have been discussed with the patient and family. After consideration of risks, benefits and other options for treatment, the patient has consented to  Procedure(s): Left Heart Cath and Coronary Angiography (Left) as a surgical intervention .  The patient's history has been reviewed, patient examined, no change in status, stable for surgery.  I have reviewed the patient's chart and labs.  Questions were answered to the patient's satisfaction.     Kathlyn [place]

## 2015-06-04 NOTE — Progress Notes (Signed)
Report to [facility] telemetry.  Keep right wrist elevated on pillow above the heart for today.  Watch right wrist for evidence of bleeding or hematoma.. If bleeding or hematoma noted, hold pressure over the site for at least 15 minutes and notify the physician.  No blending or flexing of the wrist--no lifting for the remainder of the day or for 2 weeks after your procedure. Notify the physician for evidence of infection or if you get a temperature.

## 2015-06-04 NOTE — Progress Notes (Signed)
Patient back from cardiac cath. Vitals are stable. Right wrist entry site is clean dry and intact with no evidence of hematoma. No complaints of pain. Normal saline running at 100. Will continue to monitor.

## 2015-06-04 NOTE — Progress Notes (Signed)
A&O. Medicated for pain. Slept well through the night. Heparin drip increased to 11.5 this shift. Will continue to monitor.

## 2015-06-04 NOTE — Progress Notes (Signed)
Confirmed with specials and Christell Faith that patient is on the list for cardiac cath at 1330 today. Patient can have clear liquids this morning.

## 2015-06-04 NOTE — H&P (View-Only) (Signed)
Patient: [name] [name] / Admit Date: 06/03/2015 / Date of Encounter: 06/04/2015, 9:01 AM   Subjective: Denies any significant chest pain overnight Has Nitropaste in place, on heparin infusion Discussion concerning cardiac catheterization, access, recovery, risk and benefit  Review of Systems: Review of Systems  Constitutional: Negative.   Respiratory: Negative.   Cardiovascular: Negative.   Gastrointestinal: Negative.   Musculoskeletal: Negative.   Neurological: Negative.   Psychiatric/Behavioral: Negative.   All other systems reviewed and are negative.    Objective: Telemetry: NSR Physical Exam: Blood pressure 102/61, pulse 72, temperature 97.8 F (36.6 C), temperature source Oral, resp. rate 20, height 5\' 3"  (1.6 m), weight 190 lb 11.2 oz (86.501 kg), SpO2 98 %. Body mass index is 33.79 kg/(m^2). General: Well developed, well nourished, in no acute distress. Head: Normocephalic, atraumatic, sclera non-icteric, no xanthomas, nares are without discharge. Neck: Negative for carotid bruits. JVP not elevated. Lungs: Clear bilaterally to auscultation without wheezes, rales, or rhonchi. Breathing is unlabored. Heart: RRR S1 S2 without murmurs, rubs, or gallops.  Abdomen: Soft, non-tender, non-distended with normoactive bowel sounds. No rebound/guarding. Extremities: No clubbing or cyanosis. No edema. Distal pedal pulses are 2+ and equal bilaterally. Neuro: Alert and oriented X 3. Moves all extremities spontaneously. Psych:  Responds to questions appropriately with a normal affect.   Intake/Output Summary (Last 24 hours) at 06/04/15 0901 Last data filed at 06/04/15 0622  Gross per 24 hour  Intake 403.81 ml  Output   1800 ml  Net -1396.19 ml    Inpatient Medications:  . [START ON 06/05/2015] aspirin  81 mg Oral Pre-Cath  . aspirin EC  81 mg Oral Daily  . canagliflozin  100 mg Oral QAC breakfast  . carvedilol  25 mg Oral BID  . citalopram  40 mg Oral Daily  . clopidogrel   75 mg Oral Daily  . diltiazem  120 mg Oral Daily  . insulin aspart  0-9 Units Subcutaneous TID WC  . isosorbide mononitrate  30 mg Oral QHS  . isosorbide mononitrate  60 mg Oral QPC breakfast  . levothyroxine  25 mcg Oral QAC breakfast  . losartan  25 mg Oral Daily  . multivitamin with minerals  1 tablet Oral Daily  . nitroGLYCERIN  1 inch Topical Q6H  . pantoprazole  40 mg Oral BID  . pregabalin  100 mg Oral BID  . ranolazine  1,000 mg Oral BID  . rosuvastatin  40 mg Oral q1800  . traZODone  200 mg Oral QHS   Infusions:  . [START ON 06/05/2015] sodium chloride     Followed by  . [START ON 06/05/2015] sodium chloride    . heparin 1,400 Units/hr (06/04/15 0613)    Labs:  Recent Labs  06/03/15 1246  NA 140  K 4.3  CL 108  CO2 22  GLUCOSE 164*  BUN 21*  CREATININE 1.57*  CALCIUM 9.4  MG 1.8    Recent Labs  06/03/15 1246  AST 23  ALT 24  ALKPHOS 90  BILITOT 0.5  PROT 7.1  ALBUMIN 3.9    Recent Labs  06/03/15 1246  WBC 11.0  HGB 12.9  HCT 38.9  MCV 76.9*  PLT 154    Recent Labs  06/03/15 1246 06/04/15 0411 06/04/15 0754  TROPONINI 0.04* 0.04* 0.04*   Invalid input(s): POCBNP No results for input(s): HGBA1C in the last 72 hours.   Weights: Filed Weights   06/03/15 1229 06/03/15 1513  Weight: 190 lb (86.183 kg)  190 lb 11.2 oz (86.501 kg)     Radiology/Studies:  Dg Chest 2 View  05/31/2015  CLINICAL DATA:  Chest pain beginning a few days ago. Pain now radiates into the left arm. Diaphoresis and lightheadedness. Initial encounter. No known injury. EXAM: CHEST  2 VIEW COMPARISON:  PA and lateral chest 04/17/2015 and 06/03/2014. FINDINGS: The lungs are clear. Heart size is normal. No pneumothorax or pleural effusion. Surgical clips in the left upper chest are noted. No bony abnormality. IMPRESSION: No acute disease. Electronically Signed   By: Inge Rise M.D.   On: 05/31/2015 11:05   Mr Shoulder Right Wo Contrast  05/24/2015  CLINICAL DATA:  No  known injury. Weakness and decreased range of motion. EXAM: MRI OF THE RIGHT SHOULDER WITHOUT CONTRAST TECHNIQUE: Multiplanar, multisequence MR imaging of the shoulder was performed. No intravenous contrast was administered. COMPARISON:  None. FINDINGS: Rotator cuff: Severe tendinosis of the supraspinatus tendon with a insertional interstitial tear with possible bursal surface extension. Infraspinatus tendon is intact. Teres minor tendon is intact. Subscapularis tendon is intact. Muscles: No atrophy or fatty replacement of nor abnormal signal within, the muscles of the rotator cuff. Biceps long head: Mild tendinosis of the intraarticular portion of the long head of the biceps tendon. Acromioclavicular Joint: Mild degenerative changes of the acromioclavicular joint. Type I acromion. No subacromial/ subdeltoid bursal fluid. Glenohumeral Joint: No joint effusion. Thickening of the inferior joint capsule. Mild effacement of the normal subcoracoid fat, replaced with intermediate signal soft tissue. Labrum: Grossly intact, but evaluation is limited by lack of intraarticular fluid. Bones: Subcortical reactive marrow changes in the anteromedial humeral head. No acute fracture or dislocation. IMPRESSION: 1. Severe tendinosis of the supraspinatus tendon with a insertional interstitial tear with possible bursal surface extension. 2. Mild tendinosis of the intraarticular portion of the long head of the biceps tendon. 3. Thickening of the inferior joint capsule as can be seen with adhesive capsulitis. Electronically Signed   By: Kathreen Devoid   On: 05/24/2015 09:53   Ct Angio Chest Aorta W/cm &/or Wo/cm  05/31/2015  CLINICAL DATA:  Shortness of breath and back pain for 2 days, concern for dissection EXAM: CT ANGIOGRAPHY CHEST WITH CONTRAST TECHNIQUE: Multidetector CT imaging of the chest was performed using the standard protocol during bolus administration of intravenous contrast. Multiplanar CT image reconstructions and MIPs  were obtained to evaluate the vascular anatomy. CONTRAST:  80 mL Isovue 370 COMPARISON:  03/26/2014 FINDINGS: Right-sided aortic arch again identified with closure of left subclavian artery. Right subclavian right carotid left carotid arteries are patent. Possible bypass from left common carotid to left subclavian. No evidence of aortic dissection. Pulmonary arteries not the target of this examination but central pulmonary arteries grossly negative. Mild cardiac enlargement. No pericardial effusion. Lungs are clear with no pleural effusion. Aberrant subclavian artery and closure device again exerts mass effect on the esophagus which is situated between the adherent closed artery and the trachea. No acute musculoskeletal findings. No acute abnormalities in the upper abdomen. Review of the MIP images confirms the above findings. IMPRESSION: No evidence of aortic dissection. Right aortic arch with left a variant subclavian artery, closure, and bypass from left common carotid. Similar appearance to prior study. Electronically Signed   By: Skipper Cliche M.D.   On: 05/31/2015 12:57     Assessment and Plan  45 y.o. female    1) Unstabl;e angina: Worsening unstable angina symptoms this week , severe yesterday new EKG changes compared to EKG from  last week. New ST depression V5, V6, 1 and aVL 10 out of 10 chest pain in the exam room in office yesterday, relieved with nitroglycerin. Recent visit to ER 4 days ago for similar sx Given her unstable angina symptoms, new EKG abnormality ,  sent to the emergency room yesterday for  heparin infusion. Tentatively set up for catheterization  at 1:30 PM  Case discussed with Dr. Fletcher Anon.  She may need intervention on the small diagonal branch.     Cath Lab Visit  Clinical Evaluation Leading to the Procedure:  ACS: no  Non-ACS: 40yes  Anginal Classification: CCS IV  Anti-ischemic medical therapy: MaxTherapy (3 class of medications)  Non-Invasive Test  Results: No non-invasive testing performed  Prior CABG: No previous CABG  [name] has presented today for surgery, with the diagnosis of unstable angina. The various methods of treatment have been discussed with the patient. After consideration of risks, benefits and other options for treatment, the patient has consented to Procedure(s): Left Heart Cath and Coronary Angiography (N/A) as a surgical intervention .  The patient's history has been reviewed, patient examined, no change in status, stable for surgery. I have reviewed the patient's chart and labs. Questions were answered to the patient's satisfaction.     Total encounter time more than 35 minutes  Greater than 50% was spent in counseling and coordination of care with the patient   Signed, Esmond Plants, MD, Ph.D. [facility] 06/04/2015, 9:01 AM

## 2015-06-04 NOTE — Progress Notes (Signed)
ANTICOAGULATION CONSULT NOTE - Initial Consult  Pharmacy Consult for heparin drip Indication: chest pain/ACS  No Known Allergies  Patient Measurements: Height: 5\' 3"  (160 cm) Weight: 190 lb 11.2 oz (86.501 kg) IBW/kg (Calculated) : 52.4 Heparin Dosing Weight: 71.7 kg  Vital Signs: Temp: 97.8 F (36.6 C) (05/02 0340) Temp Source: Oral (05/02 0340) BP: 96/42 mmHg (05/02 0341) Pulse Rate: 56 (05/02 0341)  Labs:  Recent Labs  06/03/15 1246 06/03/15 1936 06/04/15 0404  HGB 12.9  --   --   HCT 38.9  --   --   PLT 154  --   --   APTT 28  --   --   LABPROT 13.8  --   --   INR 1.04  --   --   HEPARINUNFRC  --  <0.10* 0.17*  CREATININE 1.57*  --   --   TROPONINI 0.04*  --   --    Estimated Creatinine Clearance: 46.7 mL/min (by C-G formula based on Cr of 1.57).  Medical History: Past Medical History  Diagnosis Date  . Colon polyp   . GERD (gastroesophageal reflux disease)   . CAD (coronary artery disease)     a. cath 08/09/2003: mild LAD plaque, no obs dzs noted; b. 12/18/10 stress test w/o ischemia; c. cath 11/2014: LM: nl, mid 85-90% s/p PCI/DES, mid LCX 30%, RCA nl d. cath 04/2015: 70% stenosis mid-diagonal at bifurcation. patent stent in mid-LAD. --> Medical management recommended  . CVA (cerebral vascular accident) ([place])     a. 06/2008-TEE nl LV fxn; b. 01/2013 - notes indicate short run of SVT at that time  . Multiple sclerosis ([place]) 2015  . Syncope   . Chronic systolic CHF (congestive heart failure) ([place])     a. echo 01/29/2013 EF 40-45%, basal anteroseptal & mid anteroseptal segments abnl; b echo 05/2012 EF 50-55%, mildly dilated LA, nl RVSP  . Orthostatic hypotension   . Aortic arch aneurysm ([place])   . Ischemic cardiomyopathy   . MS (mitral stenosis)   . Malignant melanoma of skin of scalp ([place])   . Hypertension   . Depression   . Diabetes mellitus without complication ([place])   . Headache     Migrains  . Hyperlipidemia   . CKD (chronic kidney disease), stage IV  ([place])   . Hypothyroidism   . Anxiety   . Gastric ulcer 04/27/2011   Assessment: Pharmacy consulted to dose and monitor heparin drip in this 45 year old female for chest pain/ACS. I spoke with patient regarding home medications and she denies taking and anticoagulants prior to admission. Baseline labs (protime INR, CBC, and aPTT) have been ordered.  Goal of Therapy:  Heparin level 0.3-0.7 units/ml Monitor platelets by anticoagulation protocol: Yes   Plan:  Give 4000 units bolus x 1 (~55 units/kg) Start heparin infusion at 850 units/hr (~12 units/kg/hr) Check anti-Xa level in 6 hours and daily while on heparin Continue to monitor H&H and platelets  5/1:  HL @ 19:30 = < 0.1  Will order Heparin 2150 units IV X 1 bolus and increase drip rate to 1150 units/hr. Will recheck HL 6 hrs after rate change.   5/2 04:00 heparin level 0.17. 2200 unit bolus and increase to 1400 units/hr. Recheck heparin level and CBC in 6 hours.  Thank you for the consult.  Eloise Harman, PharmD Clinical Pharmacist 06/04/2015,4:59 AM

## 2015-06-04 NOTE — Progress Notes (Signed)
[facility] [place] [facility]   PATIENT NAME: [name]    MR#:  [id number]  DATE OF BIRTH:  1970-09-15  SUBJECTIVE:  CHIEF COMPLAINT:   Chief Complaint  Patient presents with  . Chest Pain   Patient is a 45 year old [name] with past medical history significant for history of coronary artery disease status post cardiac catheterization a few months ago revealing small branch of LAD stenosis, status post stem placement in the past, stroke, diabetes, tobacco abuse who presents to the hospital from Dr. Marylou Flesher office when she was found to have EKG changes with ST depressions in V5, V6 aVL and 1, chest pain. The patient had intermittent chest pain overnight and was noted to have minimal elevation of troponin. Dr. Lewie Loron is planning cardiac catheterization for this patient later today Review of Systems  Constitutional: Negative for fever, chills and weight loss.  HENT: Negative for congestion.   Eyes: Negative for blurred vision and double vision.  Respiratory: Positive for shortness of breath. Negative for cough, sputum production and wheezing.   Cardiovascular: Positive for chest pain. Negative for palpitations, orthopnea, leg swelling and PND.  Gastrointestinal: Negative for nausea, vomiting, abdominal pain, diarrhea, constipation and blood in stool.  Genitourinary: Negative for dysuria, urgency, frequency and hematuria.  Musculoskeletal: Negative for falls.  Neurological: Negative for dizziness, tremors, focal weakness and headaches.  Endo/Heme/Allergies: Does not bruise/bleed easily.  Psychiatric/Behavioral: Negative for depression. The patient does not have insomnia.     VITAL SIGNS: Blood pressure 137/80, pulse 57, temperature 97.5 F (36.4 C), temperature source Oral, resp. rate 13, height 5\' 3"  (1.6 m), weight 87.181 kg (192 lb 3.2 oz), SpO2 100 %.  PHYSICAL EXAMINATION:   GENERAL:  45 y.o.-year-old patient lying in the bed with no  acute distress.  EYES: Pupils equal, round, reactive to light and accommodation. No scleral icterus. Extraocular muscles intact.  HEENT: Head atraumatic, normocephalic. Oropharynx and nasopharynx clear.  NECK:  Supple, no jugular venous distention. No thyroid enlargement, no tenderness.  LUNGS: Normal breath sounds bilaterally, no wheezing, rales,rhonchi or crepitation. No use of accessory muscles of respiration.  CARDIOVASCULAR: S1, S2 normal. No murmurs, rubs, or gallops.  ABDOMEN: Soft, nontender, nondistended. Bowel sounds present. No organomegaly or mass.  EXTREMITIES: No pedal edema, cyanosis, or clubbing.  NEUROLOGIC: Cranial nerves II through XII are intact. Muscle strength 5/5 in all extremities. Sensation intact. Gait not checked.  PSYCHIATRIC: The patient is alert and oriented x 3.  SKIN: No obvious rash, lesion, or ulcer.   ORDERS/RESULTS REVIEWED:   CBC  Recent Labs Lab 05/31/15 1032 06/03/15 1246  WBC 11.1* 11.0  HGB 13.2 12.9  HCT 39.9 38.9  PLT 155 154  MCV 76.4* 76.9*  MCH 25.3* 25.6*  MCHC 33.1 33.2  RDW 22.1* 22.0*   ------------------------------------------------------------------------------------------------------------------  Chemistries   Recent Labs Lab 05/31/15 1032 06/03/15 1246  NA 139 140  K 3.9 4.3  CL 105 108  CO2 24 22  GLUCOSE 193* 164*  BUN 25* 21*  CREATININE 1.53* 1.57*  CALCIUM 9.4 9.4  MG  --  1.8  AST  --  23  ALT  --  24  ALKPHOS  --  90  BILITOT  --  0.5   ------------------------------------------------------------------------------------------------------------------ estimated creatinine clearance is 46.9 mL/min (by C-G formula based on Cr of 1.57). ------------------------------------------------------------------------------------------------------------------ No results for input(s): TSH, T4TOTAL, T3FREE, THYROIDAB in the last 72 hours.  Invalid input(s): FREET3  Cardiac Enzymes  Recent Labs Lab  06/03/15 1246  06/04/15 0411 06/04/15 0754  TROPONINI 0.04* 0.04* 0.04*   ------------------------------------------------------------------------------------------------------------------ Invalid input(s): POCBNP ---------------------------------------------------------------------------------------------------------------  RADIOLOGY: No results found.  EKG:  Orders placed or performed during the hospital encounter of 06/03/15  . EKG 12-Lead  . EKG 12-Lead    ASSESSMENT AND PLAN:  Active Problems:   NSTEMI (non-ST elevated myocardial infarction) (HCC)   Colitis   Unstable angina (HCC)   Coronary artery disease involving native coronary artery of native heart with angina pectoris with documented spasm (HCC)   Uncontrolled type 2 diabetes mellitus with circulatory disorder (HCC)  #1. Non-STEMI, continue patient on current medications including heparin intravenously, aspirin, Coreg, Plavix, Imdur. Patient will undergo cardiac catheterization by Dr. Lewie Loron, and depending on findings, decision will be made regarding discharge plan, discussed with Dr. Lewie Loron, and the Management #2 CK D stage III, follow after cardiac catheterization, continue IV fluids #3. Diabetes mellitus. Type 2, continue outpatient medications, sliding scale insulin. Glucose levels 106-266. Hold metformin after cardiac catheterization #4. Essential hypertension, well-controlled on current medications Management plans discussed with the patient, family and they are in agreement.   DRUG ALLERGIES: No Known Allergies  CODE STATUS:     Code Status Orders        Start     Ordered   06/03/15 1511  Full code   Continuous     06/03/15 1510    Code Status History    Date Active Date Inactive Code Status Order ID Comments User Context   04/17/2015  9:59 PM 04/18/2015  8:45 PM Full Code [id number]  Altamese Dilling, MD ED   02/15/2015  8:04 PM 02/20/2015  7:03 PM Full Code [id number]  Audery Amel, MD Inpatient   11/12/2014   8:33 AM 11/13/2014  2:49 PM Full Code [id number]  Marcina Millard, MD Inpatient   11/07/2014  9:53 PM 11/12/2014  8:33 AM Full Code [id number]  Houston Siren, MD Inpatient      TOTAL TIME TAKING CARE OF THIS PATIENT: 35 minutes.  Discussed with Dr. Lewie Loron and care management  Ioma Chismar M.D on 06/04/2015 at 1:29 PM  Between 7am to 6pm - Pager - [phone]  After 6pm go to [email or web address] - password [email or web address] [facility]  [facility]  Office  [phone]  CC: Primary care physician; Mila Merry, MD

## 2015-06-04 NOTE — Progress Notes (Addendum)
Patient: [name] / Admit Date: 06/03/2015 / Date of Encounter: 06/04/2015, 9:01 AM   Subjective: Denies any significant chest pain overnight Has Nitropaste in place, on heparin infusion Discussion concerning cardiac catheterization, access, recovery, risk and benefit  Review of Systems: Review of Systems  Constitutional: Negative.   Respiratory: Negative.   Cardiovascular: Negative.   Gastrointestinal: Negative.   Musculoskeletal: Negative.   Neurological: Negative.   Psychiatric/Behavioral: Negative.   All other systems reviewed and are negative.    Objective: Telemetry: NSR Physical Exam: Blood pressure 102/61, pulse 72, temperature 97.8 F (36.6 C), temperature source Oral, resp. rate 20, height 5\' 3"  (1.6 m), weight 190 lb 11.2 oz (86.501 kg), SpO2 98 %. Body mass index is 33.79 kg/(m^2). General: Well developed, well nourished, in no acute distress. Head: Normocephalic, atraumatic, sclera non-icteric, no xanthomas, nares are without discharge. Neck: Negative for carotid bruits. JVP not elevated. Lungs: Clear bilaterally to auscultation without wheezes, rales, or rhonchi. Breathing is unlabored. Heart: RRR S1 S2 without murmurs, rubs, or gallops.  Abdomen: Soft, non-tender, non-distended with normoactive bowel sounds. No rebound/guarding. Extremities: No clubbing or cyanosis. No edema. Distal pedal pulses are 2+ and equal bilaterally. Neuro: Alert and oriented X 3. Moves all extremities spontaneously. Psych:  Responds to questions appropriately with a normal affect.   Intake/Output Summary (Last 24 hours) at 06/04/15 0901 Last data filed at 06/04/15 0622  Gross per 24 hour  Intake 403.81 ml  Output   1800 ml  Net -1396.19 ml    Inpatient Medications:  . [START ON 06/05/2015] aspirin  81 mg Oral Pre-Cath  . aspirin EC  81 mg Oral Daily  . canagliflozin  100 mg Oral QAC breakfast  . carvedilol  25 mg Oral BID  . citalopram  40 mg Oral Daily  . clopidogrel   75 mg Oral Daily  . diltiazem  120 mg Oral Daily  . insulin aspart  0-9 Units Subcutaneous TID WC  . isosorbide mononitrate  30 mg Oral QHS  . isosorbide mononitrate  60 mg Oral QPC breakfast  . levothyroxine  25 mcg Oral QAC breakfast  . losartan  25 mg Oral Daily  . multivitamin with minerals  1 tablet Oral Daily  . nitroGLYCERIN  1 inch Topical Q6H  . pantoprazole  40 mg Oral BID  . pregabalin  100 mg Oral BID  . ranolazine  1,000 mg Oral BID  . rosuvastatin  40 mg Oral q1800  . traZODone  200 mg Oral QHS   Infusions:  . [START ON 06/05/2015] sodium chloride     Followed by  . [START ON 06/05/2015] sodium chloride    . heparin 1,400 Units/hr (06/04/15 0613)    Labs:  Recent Labs  06/03/15 1246  NA 140  K 4.3  CL 108  CO2 22  GLUCOSE 164*  BUN 21*  CREATININE 1.57*  CALCIUM 9.4  MG 1.8    Recent Labs  06/03/15 1246  AST 23  ALT 24  ALKPHOS 90  BILITOT 0.5  PROT 7.1  ALBUMIN 3.9    Recent Labs  06/03/15 1246  WBC 11.0  HGB 12.9  HCT 38.9  MCV 76.9*  PLT 154    Recent Labs  06/03/15 1246 06/04/15 0411 06/04/15 0754  TROPONINI 0.04* 0.04* 0.04*   Invalid input(s): POCBNP No results for input(s): HGBA1C in the last 72 hours.   Weights: Filed Weights   06/03/15 1229 06/03/15 1513  Weight: 190 lb (86.183 kg)  190 lb 11.2 oz (86.501 kg)     Radiology/Studies:  Dg Chest 2 View  05/31/2015  CLINICAL DATA:  Chest pain beginning a few days ago. Pain now radiates into the left arm. Diaphoresis and lightheadedness. Initial encounter. No known injury. EXAM: CHEST  2 VIEW COMPARISON:  PA and lateral chest 04/17/2015 and 06/03/2014. FINDINGS: The lungs are clear. Heart size is normal. No pneumothorax or pleural effusion. Surgical clips in the left upper chest are noted. No bony abnormality. IMPRESSION: No acute disease. Electronically Signed   By: Inge Rise M.D.   On: 05/31/2015 11:05   Mr Shoulder Right Wo Contrast  05/24/2015  CLINICAL DATA:  No  known injury. Weakness and decreased range of motion. EXAM: MRI OF THE RIGHT SHOULDER WITHOUT CONTRAST TECHNIQUE: Multiplanar, multisequence MR imaging of the shoulder was performed. No intravenous contrast was administered. COMPARISON:  None. FINDINGS: Rotator cuff: Severe tendinosis of the supraspinatus tendon with a insertional interstitial tear with possible bursal surface extension. Infraspinatus tendon is intact. Teres minor tendon is intact. Subscapularis tendon is intact. Muscles: No atrophy or fatty replacement of nor abnormal signal within, the muscles of the rotator cuff. Biceps long head: Mild tendinosis of the intraarticular portion of the long head of the biceps tendon. Acromioclavicular Joint: Mild degenerative changes of the acromioclavicular joint. Type I acromion. No subacromial/ subdeltoid bursal fluid. Glenohumeral Joint: No joint effusion. Thickening of the inferior joint capsule. Mild effacement of the normal subcoracoid fat, replaced with intermediate signal soft tissue. Labrum: Grossly intact, but evaluation is limited by lack of intraarticular fluid. Bones: Subcortical reactive marrow changes in the anteromedial humeral head. No acute fracture or dislocation. IMPRESSION: 1. Severe tendinosis of the supraspinatus tendon with a insertional interstitial tear with possible bursal surface extension. 2. Mild tendinosis of the intraarticular portion of the long head of the biceps tendon. 3. Thickening of the inferior joint capsule as can be seen with adhesive capsulitis. Electronically Signed   By: Kathreen Devoid   On: 05/24/2015 09:53   Ct Angio Chest Aorta W/cm &/or Wo/cm  05/31/2015  CLINICAL DATA:  Shortness of breath and back pain for 2 days, concern for dissection EXAM: CT ANGIOGRAPHY CHEST WITH CONTRAST TECHNIQUE: Multidetector CT imaging of the chest was performed using the standard protocol during bolus administration of intravenous contrast. Multiplanar CT image reconstructions and MIPs  were obtained to evaluate the vascular anatomy. CONTRAST:  80 mL Isovue 370 COMPARISON:  03/26/2014 FINDINGS: Right-sided aortic arch again identified with closure of left subclavian artery. Right subclavian right carotid left carotid arteries are patent. Possible bypass from left common carotid to left subclavian. No evidence of aortic dissection. Pulmonary arteries not the target of this examination but central pulmonary arteries grossly negative. Mild cardiac enlargement. No pericardial effusion. Lungs are clear with no pleural effusion. Aberrant subclavian artery and closure device again exerts mass effect on the esophagus which is situated between the adherent closed artery and the trachea. No acute musculoskeletal findings. No acute abnormalities in the upper abdomen. Review of the MIP images confirms the above findings. IMPRESSION: No evidence of aortic dissection. Right aortic arch with left a variant subclavian artery, closure, and bypass from left common carotid. Similar appearance to prior study. Electronically Signed   By: Skipper Cliche M.D.   On: 05/31/2015 12:57     Assessment and Plan  45 y.o. female    1) Unstabl;e angina: Worsening unstable angina symptoms this week , severe yesterday new EKG changes compared to EKG from  last week. New ST depression V5, V6, 1 and aVL 10 out of 10 chest pain in the exam room in office yesterday, relieved with nitroglycerin. Recent visit to ER 4 days ago for similar sx Given her unstable angina symptoms, new EKG abnormality ,  sent to the emergency room yesterday for  heparin infusion. Tentatively set up for catheterization  at 1:30 PM  Case discussed with Dr. Fletcher Anon.  She may need intervention on the small diagonal branch.     Cath Lab Visit  Clinical Evaluation Leading to the Procedure:  ACS: no  Non-ACS: 40yes  Anginal Classification: CCS IV  Anti-ischemic medical therapy: MaxTherapy (3 class of medications)  Non-Invasive Test  Results: No non-invasive testing performed  Prior CABG: No previous CABG  Ms Courtlynn Minus has presented today for surgery, with the diagnosis of unstable angina. The various methods of treatment have been discussed with the patient. After consideration of risks, benefits and other options for treatment, the patient has consented to Procedure(s): Left Heart Cath and Coronary Angiography (N/A) as a surgical intervention .  The patient's history has been reviewed, patient examined, no change in status, stable for surgery. I have reviewed the patient's chart and labs. Questions were answered to the patient's satisfaction.     Total encounter time more than 35 minutes  Greater than 50% was spent in counseling and coordination of care with the patient   Signed, Esmond Plants, MD, Ph.D. [facility] 06/04/2015, 9:01 AM

## 2015-06-05 ENCOUNTER — Telehealth: Payer: Self-pay | Admitting: Family Medicine

## 2015-06-05 ENCOUNTER — Encounter: Payer: Self-pay | Admitting: Cardiovascular Disease

## 2015-06-05 DIAGNOSIS — I25111 Atherosclerotic heart disease of native coronary artery with angina pectoris with documented spasm: Secondary | ICD-10-CM

## 2015-06-05 DIAGNOSIS — I214 Non-ST elevation (NSTEMI) myocardial infarction: Principal | ICD-10-CM

## 2015-06-05 DIAGNOSIS — N183 Chronic kidney disease, stage 3 (moderate): Secondary | ICD-10-CM

## 2015-06-05 DIAGNOSIS — Z955 Presence of coronary angioplasty implant and graft: Secondary | ICD-10-CM

## 2015-06-05 LAB — BASIC METABOLIC PANEL
ANION GAP: 7 (ref 5–15)
BUN: 28 mg/dL — AB (ref 6–20)
CHLORIDE: 108 mmol/L (ref 101–111)
CO2: 26 mmol/L (ref 22–32)
Calcium: 8.8 mg/dL — ABNORMAL LOW (ref 8.9–10.3)
Creatinine, Ser: 1.63 mg/dL — ABNORMAL HIGH (ref 0.44–1.00)
GFR calc Af Amer: 43 mL/min — ABNORMAL LOW (ref >60)
GFR, EST NON AFRICAN AMERICAN: 37 mL/min — AB (ref >60)
Glucose, Bld: 109 mg/dL — ABNORMAL HIGH (ref 65–99)
POTASSIUM: 4.5 mmol/L (ref 3.5–5.1)
SODIUM: 141 mmol/L (ref 135–145)

## 2015-06-05 LAB — CBC
HEMATOCRIT: 34.8 % — AB (ref 35.0–47.0)
HEMOGLOBIN: 11.6 g/dL — AB (ref 12.0–16.0)
MCH: 25.7 pg — ABNORMAL LOW (ref 26.0–34.0)
MCHC: 33.2 g/dL (ref 32.0–36.0)
MCV: 77.5 fL — AB (ref 80.0–100.0)
Platelets: 137 10*3/uL — ABNORMAL LOW (ref 150–440)
RBC: 4.49 MIL/uL (ref 3.80–5.20)
RDW: 21.8 % — AB (ref 11.5–14.5)
WBC: 8.9 10*3/uL (ref 3.6–11.0)

## 2015-06-05 LAB — GLUCOSE, CAPILLARY: Glucose-Capillary: 99 mg/dL (ref 65–99)

## 2015-06-05 NOTE — Telephone Encounter (Signed)
[facility] called to schedule hospital f/u/ Pt is being DC today 06/05/15 and was treated for NSTEMI. Pt is scheduled to come in on 06/19/15 @ 10 am. Thanks TNP

## 2015-06-05 NOTE — Discharge Summary (Signed)
[facility] [place] [facility]   PATIENT NAME: [name]    MR#:  [id number]  DATE OF BIRTH:  [birth year]  DATE OF ADMISSION:  06/03/2015 ADMITTING PHYSICIAN: Enedina Finner, MD  DATE OF DISCHARGE: 06/05/2015 12:00 PM  PRIMARY CARE PHYSICIAN: Mila Merry, MD     ADMISSION DIAGNOSIS:  NSTEMI (non-ST elevated myocardial infarction) (HCC) [I21.4]  DISCHARGE DIAGNOSIS:  Principal Problem:   MI, acute, non ST segment elevation (HCC) Active Problems:   NSTEMI (non-ST elevated myocardial infarction) (HCC)   Hyperlipidemia   Coronary artery disease involving native coronary artery of native heart with unstable angina pectoris (HCC)   Coronary artery disease involving native coronary artery of native heart with angina pectoris with documented spasm (HCC)   Uncontrolled type 2 diabetes mellitus with circulatory disorder (HCC)   Presence of drug coated stent in LAD coronary artery & now in D1   CKD (chronic kidney disease) stage 3, GFR 30-59 ml/min   Colitis   SECONDARY DIAGNOSIS:   Past Medical History  Diagnosis Date  . Colon polyp   . GERD (gastroesophageal reflux disease)   . CAD (coronary artery disease)     a. cath 11/2014: LM: nl, mLAD 85-90% s/p PCI/DES (Xience 3.25 x 23), mLCX 30%, RCA nl;; b. cath 04/2015: 70% mD1 @ bifurcation. Patent LAD stent - > Med Rx.; c. Cath-PCI 5/'17: Patent LAD stent, D1 now ~80% - PCI Resolute DES 2.25 x 12.    Marland Kitchen CVA (cerebral vascular accident) (HCC)     a. 06/2008-TEE nl LV fxn; b. 01/2013 - notes indicate short run of SVT at that time  . Multiple sclerosis (HCC) 2015  . Syncope   . Chronic systolic CHF (congestive heart failure) (HCC)     a. echo 01/29/2013 EF 40-45%, basal anteroseptal & mid anteroseptal segments abnl; b echo 05/2012 EF 50-55%, mildly dilated LA, nl RVSP  . Orthostatic hypotension   . Aortic arch aneurysm (HCC)   . Ischemic cardiomyopathy   . MS (mitral stenosis)   . Malignant melanoma of skin  of scalp (HCC)   . Hypertension   . Depression   . Diabetes mellitus without complication (HCC)   . Headache     Migrains  . Hyperlipidemia   . CKD (chronic kidney disease), stage IV (HCC)   . Hypothyroidism   . Anxiety   . Gastric ulcer 04/27/2011    .pro HOSPITAL COURSE:  Patient is a 45 year old [name] with past medical history significant for history of coronary artery disease status post cardiac catheterization a few months ago revealing small branch of LAD stenosis, status post stent placement in the past, stroke, diabetes, tobacco abuse who presents to the hospital from Dr. Marylou Flesher office when she was found to have EKG changes with ST depressions in V5, V6 aVL and 1, chest pain. The patient had intermittent chest pain overnight After admission despite medical therapy and was noted to have minimal elevation of troponin, decision was made to repeat cardiac catheterization. Cardiac catheterization revealed the first diagonal lesion, 80% stenosis, drug-eluting stent was placed with residual 0% stenosis. Postprocedure, patient did well, complained of no pain, shortness of breath, was able to ambulate around nursing station with no discomfort, she was advised to continue her usual home medications and follow up with Dr. Lewie Loron as outpatient Discussion by problem: #1. Non-STEMI, , status post cardiac catheterization on 06/04/2015 revealing 80% stenosis and first diagonal, drug-eluting stent was placed with residual 0% stenosis, patient is  to continue aspirin, Coreg, Plavix, Imdur. Patient is being discharged home today and she is to follow-up with Dr. Lewie Loron within 1 week after discharge. #2 CK D stage III, creatinine is 1.63 today after cardiac catheterization, patient was managed on IV fluids while in the hospital, it is recommended to follow patient's creatinine level as outpatient and resume ACE inhibitor, metformin, if kidney function remains stable #3. Diabetes mellitus. Type 2,  continue outpatient medications, sliding scale insulin, except of metformin, which is to be held 48 hours after cardiac catheterization. . Glucose levels 99-273. Patient was advised to continue diabetic diet, watching blood glucose levels closely. Patient is to continue in [place].  #4. Essential hypertension, well-controlled on current medications  DISCHARGE CONDITIONS:   Stable  CONSULTS OBTAINED:  Treatment Team:  Iran Ouch, MD Antonieta Iba, MD  DRUG ALLERGIES:  No Known Allergies  DISCHARGE MEDICATIONS:   Discharge Medication List as of 06/05/2015 11:30 AM    CONTINUE these medications which have NOT CHANGED   Details  aspirin EC 81 MG tablet Take 1 tablet (81 mg total) by mouth daily., Starting 02/19/2015, Until Discontinued, No Print    baclofen (LIORESAL) 10 MG tablet Take 1 tablet (10 mg total) by mouth 2 (two) times daily as needed for muscle spasms., Starting 02/19/2015, Until Discontinued, No Print    canagliflozin (INVOKANA) 100 MG TABS tablet Take 1 tablet (100 mg total) by mouth daily before breakfast., Starting 02/22/2015, Until Discontinued, Normal    carvedilol (COREG) 25 MG tablet Take 1 tablet (25 mg total) by mouth 2 (two) times daily., Starting 04/18/2015, Until Discontinued, Normal    citalopram (CELEXA) 40 MG tablet Take 1 tablet (40 mg total) by mouth daily., Starting 04/18/2015, Until Discontinued, Normal    clopidogrel (PLAVIX) 75 MG tablet Take 1 tablet (75 mg total) by mouth daily., Starting 04/18/2015, Until Discontinued, Normal    diltiazem (CARDIZEM CD) 120 MG 24 hr capsule Take 1 capsule (120 mg total) by mouth daily., Starting 04/18/2015, Until Discontinued, Normal    eszopiclone (LUNESTA) 2 MG TABS tablet Take 2 mg by mouth at bedtime as needed for sleep. Take immediately before bedtime, Until Discontinued, Historical Med    !! isosorbide mononitrate (IMDUR) 30 MG 24 hr tablet Take 1 tablet (30 mg total) by mouth at bedtime., Starting  05/31/2015, Until Sat 05/30/16, Print    !! isosorbide mononitrate (IMDUR) 60 MG 24 hr tablet Take 1 tablet (60 mg total) by mouth daily after breakfast., Starting 05/31/2015, Until Discontinued, Print    levothyroxine (SYNTHROID, LEVOTHROID) 25 MCG tablet Take 1 tablet (25 mcg total) by mouth daily before breakfast., Starting 04/18/2015, Until Discontinued, Normal    Multiple Vitamin (MULTIVITAMIN WITH MINERALS) TABS tablet Take 1 tablet by mouth daily., Until Discontinued, Historical Med    nitroGLYCERIN (NITROSTAT) 0.4 MG SL tablet Place 1 tablet (0.4 mg total) under the tongue every 5 (five) minutes as needed for chest pain., Starting 04/30/2015, Until Discontinued, Normal    pantoprazole (PROTONIX) 40 MG tablet Take 1 tablet (40 mg total) by mouth 2 (two) times daily., Starting 04/18/2015, Until Discontinued, Normal    pregabalin (LYRICA) 100 MG capsule Take 1 capsule (100 mg total) by mouth 2 (two) times daily., Starting 01/03/2015, Until Discontinued, Print    promethazine (PHENERGAN) 25 MG tablet Take 25 mg by mouth every 6 (six) hours as needed for nausea or vomiting. Reported on 05/31/2015, Until Discontinued, Historical Med    ranolazine (RANEXA) 1000 MG SR tablet Take 1  tablet (1,000 mg total) by mouth 2 (two) times daily., Starting 05/31/2015, Until Discontinued, Normal    rosuvastatin (CRESTOR) 40 MG tablet Take 1 tablet (40 mg total) by mouth daily at 6 PM., Starting 04/30/2015, Until Discontinued, Normal    traZODone (DESYREL) 100 MG tablet Take 200 mg by mouth at bedtime., Until Discontinued, Historical Med     !! - Potential duplicate medications found. Please discuss with provider.    STOP taking these medications     losartan (COZAAR) 25 MG tablet      metFORMIN (GLUCOPHAGE) 500 MG tablet          DISCHARGE INSTRUCTIONS:    Patient is to follow-up with primary care physician, cardiologist, Dr. Lewie Loron  If you experience worsening of your admission symptoms, develop  shortness of breath, life threatening emergency, suicidal or homicidal thoughts you must seek medical attention immediately by calling 911 or calling your MD immediately  if symptoms less severe.  You Must read complete instructions/literature along with all the possible adverse reactions/side effects for all the Medicines you take and that have been prescribed to you. Take any new Medicines after you have completely understood and accept all the possible adverse reactions/side effects.   Please note  You were cared for by a hospitalist during your hospital stay. If you have any questions about your discharge medications or the care you received while you were in the hospital after you are discharged, you can call the unit and asked to speak with the hospitalist on call if the hospitalist that took care of you is not available. Once you are discharged, your primary care physician will handle any further medical issues. Please note that NO REFILLS for any discharge medications will be authorized once you are discharged, as it is imperative that you return to your primary care physician (or establish a relationship with a primary care physician if you do not have one) for your aftercare needs so that they can reassess your need for medications and monitor your lab values.    Today   CHIEF COMPLAINT:   Chief Complaint  Patient presents with  . Chest Pain    HISTORY OF PRESENT ILLNESS:  [place] [address] [name]  is a 45 y.o. female with a known history of coronary artery disease status post cardiac catheterization a few months ago revealing small branch of LAD stenosis, status post stent placement in the past, stroke, diabetes, tobacco abuse who presents to the hospital from Dr. Marylou Flesher office when she was found to have EKG changes with ST depressions in V5, V6 aVL and 1, chest pain. The patient had intermittent chest pain overnight After admission despite medical therapy and was noted to have minimal  elevation of troponin, decision was made to repeat cardiac catheterization. Cardiac catheterization revealed the first diagonal lesion, 80% stenosis, drug-eluting stent was placed with residual 0% stenosis. Postprocedure, patient did well, complained of no pain, shortness of breath, was able to ambulate around nursing station with no discomfort, she was advised to continue her usual home medications and follow up with Dr. Lewie Loron as outpatient Discussion by problem: #1. Non-STEMI, , status post cardiac catheterization on 06/04/2015 revealing 80% stenosis and first diagonal, drug-eluting stent was placed with residual 0% stenosis, patient is to continue aspirin, Coreg, Plavix, Imdur. Patient is being discharged home today and she is to follow-up with Dr. Lewie Loron within 1 week after discharge. #2 CK D stage III, creatinine is 1.63 today after cardiac catheterization, patient was managed on IV  fluids while in the hospital, it is recommended to follow patient's creatinine level as outpatient and resume ACE inhibitor, metformin, if kidney function remains stable #3. Diabetes mellitus. Type 2, continue outpatient medications, sliding scale insulin, except of metformin, which is to be held 48 hours after cardiac catheterization. . Glucose levels 99-273. Patient was advised to continue diabetic diet, watching blood glucose levels closely. Patient is to continue in [place].  #4. Essential hypertension, well-controlled on current medications    VITAL SIGNS:  Blood pressure 111/71, pulse 58, temperature 97.5 F (36.4 C), temperature source Oral, resp. rate 18, height 5\' 3"  (1.6 m), weight 87.181 kg (192 lb 3.2 oz), SpO2 93 %.  I/O:   Intake/Output Summary (Last 24 hours) at 06/05/15 1434 Last data filed at 06/05/15 0830  Gross per 24 hour  Intake    240 ml  Output   2600 ml  Net  -2360 ml    PHYSICAL EXAMINATION:  GENERAL:  45 y.o.-year-old patient lying in the bed with no acute distress.  EYES: Pupils  equal, round, reactive to light and accommodation. No scleral icterus. Extraocular muscles intact.  HEENT: Head atraumatic, normocephalic. Oropharynx and nasopharynx clear.  NECK:  Supple, no jugular venous distention. No thyroid enlargement, no tenderness.  LUNGS: Normal breath sounds bilaterally, no wheezing, rales,rhonchi or crepitation. No use of accessory muscles of respiration.  CARDIOVASCULAR: S1, S2 normal. No murmurs, rubs, or gallops.  ABDOMEN: Soft, non-tender, non-distended. Bowel sounds present. No organomegaly or mass.  EXTREMITIES: No pedal edema, cyanosis, or clubbing.  NEUROLOGIC: Cranial nerves II through XII are intact. Muscle strength 5/5 in all extremities. Sensation intact. Gait not checked.  PSYCHIATRIC: The patient is alert and oriented x 3.  SKIN: No obvious rash, lesion, or ulcer.   DATA REVIEW:   CBC  Recent Labs Lab 06/05/15 0413  WBC 8.9  HGB 11.6*  HCT 34.8*  PLT 137*    Chemistries   Recent Labs Lab 06/03/15 1246 06/05/15 0413  NA 140 141  K 4.3 4.5  CL 108 108  CO2 22 26  GLUCOSE 164* 109*  BUN 21* 28*  CREATININE 1.57* 1.63*  CALCIUM 9.4 8.8*  MG 1.8  --   AST 23  --   ALT 24  --   ALKPHOS 90  --   BILITOT 0.5  --     Cardiac Enzymes  Recent Labs Lab 06/04/15 0754  TROPONINI 0.04*    Microbiology Results  Results for orders placed or performed in visit on 04/05/15  Urine Culture     Status: Abnormal   Collection Time: 04/05/15 11:45 AM  Result Value Ref Range Status   Urine Culture, Routine Final report (A)  Final   Urine Culture result 1 Klebsiella pneumoniae (A)  Final    Comment: Greater than 100,000 colony forming units per mL   ANTIMICROBIAL SUSCEPTIBILITY Comment  Final    Comment:       ** S = Susceptible; I = Intermediate; R = Resistant **                    P = Positive; N = Negative             MICS are expressed in micrograms per mL    Antibiotic                 RSLT#1    RSLT#2    RSLT#3     RSLT#4 Amoxicillin/Clavulanic Acid    S Ampicillin  R Cefepime                       S Ceftriaxone                    S Cefuroxime                     S Cephalothin                    S Ciprofloxacin                  S Ertapenem                      S Gentamicin                     S Imipenem                       S Levofloxacin                   S Nitrofurantoin                 S Piperacillin                   S Tetracycline                   S Tobramycin                     S Trimethoprim/Sulfa             S     RADIOLOGY:  No results found.  EKG:   Orders placed or performed during the hospital encounter of 06/03/15  . EKG 12-Lead  . EKG 12-Lead  . EKG 12-Lead immediately post procedure  . EKG 12-Lead  . EKG 12-Lead immediately post procedure  . EKG 12-Lead      Management plans discussed with the patient, family and they are in agreement.  CODE STATUS:     Code Status Orders        Start     Ordered   06/03/15 1511  Full code   Continuous     06/03/15 1510    Code Status History    Date Active Date Inactive Code Status Order ID Comments User Context   04/17/2015  9:59 PM 04/18/2015  8:45 PM Full Code [id number]  Altamese Dilling, MD ED   02/15/2015  8:04 PM 02/20/2015  7:03 PM Full Code [id number]  Audery Amel, MD Inpatient   11/12/2014  8:33 AM 11/13/2014  2:49 PM Full Code [id number]  Marcina Millard, MD Inpatient   11/07/2014  9:53 PM 11/12/2014  8:33 AM Full Code [id number]  Houston Siren, MD Inpatient      TOTAL TIME TAKING CARE OF THIS PATIENT: 40 minutes.   Discussed with cardiologist Reda Gettis M.D on 06/05/2015 at 2:34 PM  Between 7am to 6pm - Pager - [phone]  After 6pm go to [email or web address] - password [email or web address] [facility]  [facility]  Office  [phone]  CC: Primary care physician; Mila Merry, MD

## 2015-06-05 NOTE — Progress Notes (Signed)
Pt. Discharged to home via wc. Discharge instructions and medication regimen reviewed at bedside with patient. Pt. verbalizes understanding of instructions and medication regimen. Stent card given to patient with d/c papers. Patient assessment unchanged from this morning. TELE and IV discontinued per policy.

## 2015-06-05 NOTE — Progress Notes (Signed)
Patient: [name] / Admit Date: 06/03/2015 / Date of Encounter: 06/05/2015, 9:34 AM   Subjective: Status post PCI/DES to D1. No further chest pain. Feels much better. Had a good night sleep for the first time in a long time. Has ambulated in her room without issues. BP and HR stable. Has diuresed 4.8 L for the admission and 3.4 L for the past 24 HR.  Review of Systems: Review of Systems  Constitutional: Negative for fever, chills, weight loss, malaise/fatigue and diaphoresis.  HENT: Negative for congestion.   Eyes: Negative for discharge and redness.  Respiratory: Negative for cough, hemoptysis, sputum production, shortness of breath and wheezing.   Cardiovascular: Negative for chest pain, palpitations, orthopnea, claudication, leg swelling and PND.  Gastrointestinal: Negative for nausea, vomiting and abdominal pain.  Musculoskeletal: Negative for myalgias and falls.  Skin: Negative for rash.  Neurological: Negative for dizziness, tingling, tremors, sensory change, speech change, focal weakness, loss of consciousness and weakness.  Endo/Heme/Allergies: Does not bruise/bleed easily.  Psychiatric/Behavioral: The patient is not nervous/anxious.   All other systems reviewed and are negative.    Objective: Telemetry: NSR, 70's Physical Exam: Blood pressure 111/71, pulse 58, temperature 97.5 F (36.4 C), temperature source Oral, resp. rate 18, height 5\' 3"  (1.6 m), weight 192 lb 3.2 oz (87.181 kg), SpO2 93 %. Body mass index is 34.06 kg/(m^2). General: Well developed, well nourished, in no acute distress. Head: Normocephalic, atraumatic, sclera non-icteric, no xanthomas, nares are without discharge. Neck: Negative for carotid bruits. JVP not elevated. Lungs: Clear bilaterally to auscultation without wheezes, rales, or rhonchi. Breathing is unlabored. Heart: RRR S1 S2 without murmurs, rubs, or gallops.  Abdomen: Soft, non-tender, non-distended with normoactive bowel sounds. No  rebound/guarding. Extremities: No clubbing or cyanosis. No edema. Distal pedal pulses are 2+ and equal bilaterally. Cath site well healed without bleeding, bruising, erythema, swelling, or TTP. Distal pulse 2+.  Neuro: Alert and oriented X 3. Moves all extremities spontaneously. Psych:  Responds to questions appropriately with a normal affect.   Intake/Output Summary (Last 24 hours) at 06/05/15 0934 Last data filed at 06/05/15 0519  Gross per 24 hour  Intake     70 ml  Output   3500 ml  Net  -3430 ml    Inpatient Medications:  . aspirin EC  81 mg Oral Daily  . carvedilol  25 mg Oral BID  . citalopram  40 mg Oral Daily  . clopidogrel  75 mg Oral Daily  . diltiazem  120 mg Oral Daily  . insulin aspart  0-9 Units Subcutaneous TID WC  . isosorbide mononitrate  30 mg Oral QHS  . isosorbide mononitrate  60 mg Oral QPC breakfast  . levothyroxine  25 mcg Oral QAC breakfast  . losartan  25 mg Oral Daily  . multivitamin with minerals  1 tablet Oral Daily  . pantoprazole  40 mg Oral BID  . pregabalin  100 mg Oral BID  . ranolazine  1,000 mg Oral BID  . rosuvastatin  40 mg Oral q1800  . sodium chloride flush  3 mL Intravenous Q12H  . traZODone  200 mg Oral QHS   Infusions:    Labs:  Recent Labs  06/03/15 1246 06/05/15 0413  NA 140 141  K 4.3 4.5  CL 108 108  CO2 22 26  GLUCOSE 164* 109*  BUN 21* 28*  CREATININE 1.57* 1.63*  CALCIUM 9.4 8.8*  MG 1.8  --     Recent Labs  06/03/15 1246  AST 23  ALT 24  ALKPHOS 90  BILITOT 0.5  PROT 7.1  ALBUMIN 3.9    Recent Labs  06/03/15 1246 06/05/15 0413  WBC 11.0 8.9  HGB 12.9 11.6*  HCT 38.9 34.8*  MCV 76.9* 77.5*  PLT 154 137*    Recent Labs  06/03/15 1246 06/04/15 0411 06/04/15 0754  TROPONINI 0.04* 0.04* 0.04*   Invalid input(s): POCBNP No results for input(s): HGBA1C in the last 72 hours.   Weights: Filed Weights   06/03/15 1229 06/03/15 1513 06/04/15 0859  Weight: 190 lb (86.183 kg) 190 lb 11.2 oz  (86.501 kg) 192 lb 3.2 oz (87.181 kg)     Radiology/Studies:  Dg Chest 2 View  05/31/2015  CLINICAL DATA:  Chest pain beginning a few days ago. Pain now radiates into the left arm. Diaphoresis and lightheadedness. Initial encounter. No known injury. EXAM: CHEST  2 VIEW COMPARISON:  PA and lateral chest 04/17/2015 and 06/03/2014. FINDINGS: The lungs are clear. Heart size is normal. No pneumothorax or pleural effusion. Surgical clips in the left upper chest are noted. No bony abnormality. IMPRESSION: No acute disease. Electronically Signed   By: Inge Rise M.D.   On: 05/31/2015 11:05   Mr Shoulder Right Wo Contrast  05/24/2015  CLINICAL DATA:  No known injury. Weakness and decreased range of motion. EXAM: MRI OF THE RIGHT SHOULDER WITHOUT CONTRAST TECHNIQUE: Multiplanar, multisequence MR imaging of the shoulder was performed. No intravenous contrast was administered. COMPARISON:  None. FINDINGS: Rotator cuff: Severe tendinosis of the supraspinatus tendon with a insertional interstitial tear with possible bursal surface extension. Infraspinatus tendon is intact. Teres minor tendon is intact. Subscapularis tendon is intact. Muscles: No atrophy or fatty replacement of nor abnormal signal within, the muscles of the rotator cuff. Biceps long head: Mild tendinosis of the intraarticular portion of the long head of the biceps tendon. Acromioclavicular Joint: Mild degenerative changes of the acromioclavicular joint. Type I acromion. No subacromial/ subdeltoid bursal fluid. Glenohumeral Joint: No joint effusion. Thickening of the inferior joint capsule. Mild effacement of the normal subcoracoid fat, replaced with intermediate signal soft tissue. Labrum: Grossly intact, but evaluation is limited by lack of intraarticular fluid. Bones: Subcortical reactive marrow changes in the anteromedial humeral head. No acute fracture or dislocation. IMPRESSION: 1. Severe tendinosis of the supraspinatus tendon with a  insertional interstitial tear with possible bursal surface extension. 2. Mild tendinosis of the intraarticular portion of the long head of the biceps tendon. 3. Thickening of the inferior joint capsule as can be seen with adhesive capsulitis. Electronically Signed   By: Kathreen Devoid   On: 05/24/2015 09:53   Ct Angio Chest Aorta W/cm &/or Wo/cm  05/31/2015  CLINICAL DATA:  Shortness of breath and back pain for 2 days, concern for dissection EXAM: CT ANGIOGRAPHY CHEST WITH CONTRAST TECHNIQUE: Multidetector CT imaging of the chest was performed using the standard protocol during bolus administration of intravenous contrast. Multiplanar CT image reconstructions and MIPs were obtained to evaluate the vascular anatomy. CONTRAST:  80 mL Isovue 370 COMPARISON:  03/26/2014 FINDINGS: Right-sided aortic arch again identified with closure of left subclavian artery. Right subclavian right carotid left carotid arteries are patent. Possible bypass from left common carotid to left subclavian. No evidence of aortic dissection. Pulmonary arteries not the target of this examination but central pulmonary arteries grossly negative. Mild cardiac enlargement. No pericardial effusion. Lungs are clear with no pleural effusion. Aberrant subclavian artery and closure device again exerts mass effect on  the esophagus which is situated between the adherent closed artery and the trachea. No acute musculoskeletal findings. No acute abnormalities in the upper abdomen. Review of the MIP images confirms the above findings. IMPRESSION: No evidence of aortic dissection. Right aortic arch with left a variant subclavian artery, closure, and bypass from left common carotid. Similar appearance to prior study. Electronically Signed   By: Skipper Cliche M.D.   On: 05/31/2015 12:57     Assessment and Plan   1. CAD s/p prior PCI/DES to mid LAD 11/2014 s/p PCI/DES to D1 on 06/04/2015: -No further angina -Continue DAPT with aspirin and Plavix -Continue  Coreg, Imdur, Ranexa, losartan, Crestor -Cardiac rehab as an outpatient  2. Ischemic cardiomyopathy: -She does not appear to be volume overloaded at this time -Has diuresed 4.8 L for the admission  -Coreg, losartan  -Limit PO fluids  3. History of syncope: -None recently  4. History of stroke: -Aspirin and Plavix  5. HLD: -Crestor  Signed, Christell Faith, PA-C Pager: [phone] 06/05/2015, 9:34 AM   I have seen, examined and evaluated the patient this AM along with Mr. Purcell Mouton on AM rounds.  After reviewing all the available data and chart we discussed her case on rounds.  I agree with his findings, examination as well as impression recommendations as per our discussion.  She looks & feels good this AM following PCI to her Diag lesion yesterday with DES.  She is on a stable medication regimen. She has been walking up & down the halls without any further angina or CHF Sx.  Exam is normal - cath site stable.  She is fine for d/c from a CV standpoint today --> She will f/u with Dr. [name].    Leonie Man, M.D., M.S. Interventional Cardiologist   Pager # [phone] Phone # [phone] [address] [place], [place] [address]

## 2015-06-06 ENCOUNTER — Other Ambulatory Visit: Payer: BC Managed Care – PPO

## 2015-06-10 ENCOUNTER — Ambulatory Visit: Payer: BC Managed Care – PPO | Admitting: Cardiovascular Disease

## 2015-06-11 ENCOUNTER — Ambulatory Visit: Admission: RE | Admit: 2015-06-11 | Payer: BC Managed Care – PPO | Source: Ambulatory Visit | Admitting: Surgery

## 2015-06-11 ENCOUNTER — Encounter: Admission: RE | Payer: Self-pay | Source: Ambulatory Visit

## 2015-06-11 SURGERY — ARTHROSCOPY, SHOULDER WITH REPAIR, ROTATOR CUFF, OPEN
Anesthesia: Choice | Laterality: Right

## 2015-06-13 ENCOUNTER — Ambulatory Visit (INDEPENDENT_AMBULATORY_CARE_PROVIDER_SITE_OTHER): Payer: BC Managed Care – PPO | Admitting: Cardiovascular Disease

## 2015-06-13 ENCOUNTER — Encounter: Payer: Self-pay | Admitting: Cardiovascular Disease

## 2015-06-13 VITALS — BP 150/90 | HR 65 | Ht 63.0 in | Wt 194.0 lb

## 2015-06-13 DIAGNOSIS — I2510 Atherosclerotic heart disease of native coronary artery without angina pectoris: Secondary | ICD-10-CM

## 2015-06-13 DIAGNOSIS — I25110 Atherosclerotic heart disease of native coronary artery with unstable angina pectoris: Secondary | ICD-10-CM

## 2015-06-13 DIAGNOSIS — I5022 Chronic systolic (congestive) heart failure: Secondary | ICD-10-CM

## 2015-06-13 DIAGNOSIS — I10 Essential (primary) hypertension: Secondary | ICD-10-CM

## 2015-06-13 DIAGNOSIS — E118 Type 2 diabetes mellitus with unspecified complications: Secondary | ICD-10-CM

## 2015-06-13 DIAGNOSIS — N183 Chronic kidney disease, stage 3 unspecified: Secondary | ICD-10-CM

## 2015-06-13 MED ORDER — FUROSEMIDE 20 MG PO TABS: 20.0000 mg | ORAL_TABLET | Freq: Every day | ORAL | Status: DC | PRN

## 2015-06-13 MED ORDER — FUROSEMIDE 20 MG PO TABS: 20.0000 mg | ORAL_TABLET | Freq: Every day | ORAL | Status: DC

## 2015-06-13 NOTE — Progress Notes (Signed)
Patient ID: [name], female    DOB: 01-18-1971, 45 y.o.   MRN: [id number]  HPI Comments: 45 year old woman with a long hx of smoking, numerous prior strokes,  history of chest pain dating back to 2005 at that time with cardiac catheterization on August 09, 2003 found to have mild plaque in the LAD, but there was no obstructive disease noted.   MUGA, which showed an ejection fraction of 51%.   Transesophageal echocardiogram in the setting of a CVA in May 2010  showed normal LV function, s/p gastric bypass surgery, Who presents for routine followup of her coronary artery disease  Cardiac catheterization   March 2017showing moderate LAD disease, 70% diagonal disease small vessel, near the ostium,  Decision was made to treat this medically  She has continued to have worsening unstable angina symptoms Went to the emergency room for recurrent chest pain symptoms Despite optimal medical management, continued to have angina She had new EKG changes in clinic 2 weeks ago Scheduled for cardiac catheterization 06/04/2015, had stent placed to her small diagonal branch  In follow-up today she reports chest pain has resolved. Weight has been trending upwards, feels like she is retaining fluid. Numbers provided today shows weight up from 190 pounds up to 195 pounds in the past week Denies any leg edema, no PND or orthopnea She does have significant salt and fluid intake She is going to the beach this weekend  EKG on today's visit shows normal sinus rhythm with rate 65 bpm, nonspecific ST abnormality   other past medical history reviewed Recent carotid ultrasound reviewed with her showing minimal bilateral carotid disease Recent echocardiogram reviewed with her showing essentially normal ejection fraction  She is status post left carotid to subclavian artery bypass with subclavian artery ligation, done at [facility] by vascular surgery admission to [facility] from 10/5 to 11/13/14 with unstable angina  and was found to have severe mid LAD lesion at 85-90% stenosis on diagnositc cardiac cath s/p PCI/DES on staged procedure.   CT in October 2015 showing Right aortic arch with a large retro- esophageal vascular segment, persistent dorsal aortic segment) giving off aberrant left subclavian artery. This large caliber retroaortic segment measuring 18 mm compared to the ascending aorta which measures 22 mm. This large retroesophageal aortic segment along with the partially calcified atretic ductus ligament between the left aberrant subclavian artery and the left pulmonary artery creates an ALMOSTcomplete vascular ring. This is likelythe cause ofpatient's dysphagia [place].  Previously was taking midodrine or Florinef for hypotension following her stroke.  She previously presented to the hospital December 18, 2010 with chest pain.   stress test in the hospital and showed no ischemia. Cardiac enzymes were negative. No EKG changes noted.   admission to the hospital 11/01/2012 with discharge 11/02/2012 with chest pain. She ruled out with negative cardiac enzymes. Blood pressures were in the [id number] systolic range. Discharge blood pressure medications included amlodipine 10 mg daily, benazepril 40 mg daily, clonidine 0.2 mg., Coreg 3.125 mg twice a day  stroke and admission to the hospital at the end of December 2014. She had acute onset left sided weakness including arm and leg. She reports that review of MRI showed acute stroke, as well as previous strokes. She was seen by neurology and aspirin changed to Plavix 75 mg daily. Hemoglobin A1c 8.5. Notes indicate short run of SVT while she was in the hospital and on the monitor.  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  Echocardiogram 01/29/2013 suggest ejection fraction 40-45% CT scan of the head showing small vessel disease MRI showing acute infarct right  parietal white matter, extensive chronic microvascular ischemic changes  Echocardiogram April 2014 showed ejection fraction 50-55%, otherwise normal study Stress test 11/02/2012 showing no ischemia,   No Known Allergies  Outpatient Encounter Prescriptions as of 06/13/2015  Medication Sig  . aspirin EC 81 MG tablet Take 1 tablet (81 mg total) by mouth daily.  . baclofen (LIORESAL) 10 MG tablet Take 1 tablet (10 mg total) by mouth 2 (two) times daily as needed for muscle spasms.  . canagliflozin (INVOKANA) 100 MG TABS tablet Take 1 tablet (100 mg total) by mouth daily before breakfast.  . carvedilol (COREG) 25 MG tablet Take 1 tablet (25 mg total) by mouth 2 (two) times daily.  . citalopram (CELEXA) 40 MG tablet Take 1 tablet (40 mg total) by mouth daily.  . clopidogrel (PLAVIX) 75 MG tablet Take 1 tablet (75 mg total) by mouth daily.  Marland Kitchen diltiazem (CARDIZEM CD) 120 MG 24 hr capsule Take 1 capsule (120 mg total) by mouth daily.  . eszopiclone (LUNESTA) 2 MG TABS tablet Take 2 mg by mouth at bedtime as needed for sleep. Take immediately before bedtime  . isosorbide mononitrate (IMDUR) 30 MG 24 hr tablet Take 1 tablet (30 mg total) by mouth at bedtime.  . isosorbide mononitrate (IMDUR) 60 MG 24 hr tablet Take 1 tablet (60 mg total) by mouth daily after breakfast.  . levothyroxine (SYNTHROID, LEVOTHROID) 25 MCG tablet Take 1 tablet (25 mcg total) by mouth daily before breakfast.  . losartan (COZAAR) 25 MG tablet Take 25 mg by mouth daily.  . metFORMIN (GLUCOPHAGE) 1000 MG tablet Take 1,000 mg by mouth at bedtime.  . Multiple Vitamin (MULTIVITAMIN WITH MINERALS) TABS tablet Take 1 tablet by mouth daily.  . nitroGLYCERIN (NITROSTAT) 0.4 MG SL tablet Place 1 tablet (0.4 mg total) under the tongue every 5 (five) minutes as needed for chest pain.  . pantoprazole (PROTONIX) 40 MG tablet Take 1 tablet (40 mg total) by mouth 2 (two) times daily.  . pregabalin (LYRICA) 100 MG capsule Take 1 capsule (100 mg  total) by mouth 2 (two) times daily.  . promethazine (PHENERGAN) 25 MG tablet Take 25 mg by mouth every 6 (six) hours as needed for nausea or vomiting. Reported on 05/31/2015  . ranolazine (RANEXA) 1000 MG SR tablet Take 1 tablet (1,000 mg total) by mouth 2 (two) times daily.  . rosuvastatin (CRESTOR) 40 MG tablet Take 1 tablet (40 mg total) by mouth daily at 6 PM.  . traZODone (DESYREL) 100 MG tablet Take 200 mg by mouth at bedtime.  . furosemide (LASIX) 20 MG tablet Take 1 tablet (20 mg total) by mouth daily as needed.  . [DISCONTINUED] furosemide (LASIX) 20 MG tablet Take 1 tablet (20 mg total) by mouth daily.   No facility-administered encounter medications on file as of 06/13/2015.    Past Medical History  Diagnosis Date  . Colon polyp   . GERD (gastroesophageal reflux disease)   . CAD (coronary artery disease)     a. cath 11/2014: LM: nl, mLAD 85-90% s/p PCI/DES (Xience 3.25 x 23), mLCX 30%, RCA nl;; b. cath 04/2015: 70% mD1 @ bifurcation. Patent LAD stent - > Med Rx.; c. Cath-PCI 5/'17: Patent LAD stent, D1 now ~80% - PCI Resolute DES 2.25 x 12.    Marland Kitchen CVA (cerebral vascular accident) (  [place])     a. 06/2008-TEE nl LV fxn; b. 01/2013 - notes indicate short run of SVT at that time  . Multiple sclerosis ([place]) 2015  . Syncope   . Chronic systolic CHF (congestive heart failure) ([place])     a. echo 01/29/2013 EF 40-45%, basal anteroseptal & mid anteroseptal segments abnl; b echo 05/2012 EF 50-55%, mildly dilated LA, nl RVSP  . Orthostatic hypotension   . 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 Ischemic cardiomyopathy   . MS (mitral stenosis)   . Malignant melanoma of skin of scalp ([place])   . Hypertension   . Depression   . Diabetes mellitus without complication ([place])   . Headache     Migrains  . Hyperlipidemia   . CKD (chronic kidney disease), stage IV ([place])   . Hypothyroidism   . Anxiety   . Gastric ulcer 04/27/2011    Past Surgical History  Procedure Laterality Date  . Appendectomy    .  Right oophorectomy    . Tubal ligation      R tube removed still w/ Left  . Dilation and curettage of uterus    . Gastric bypass  09/2009    [facility]   . Esophagogastroduodenoscopy (egd) with propofol N/A 09/14/2014    Procedure: ESOPHAGOGASTRODUODENOSCOPY (EGD) WITH PROPOFOL;  Surgeon: Josefine Class, MD;  Location[facility];  Service: Endoscopy;  Laterality: N/A;  . Cardiac catheterization N/A 11/09/2014    Procedure: Coronary Angiography;  Surgeon: Minna Merritts, MD;  Location: [place] CV LAB;  Service: Cardiovascular;  Laterality: N/A;  . Cardiac catheterization N/A 11/12/2014    Procedure: Coronary Stent Intervention;  Surgeon: Isaias Cowman, MD;  Location: [place] CV LAB;  Service: Cardiovascular;  Laterality: N/A;  . Trigger finger release    . Melanoma excision  2016    Dr. Evorn Gong  . Cardiac catheterization N/A 04/18/2015    Procedure: Left Heart Cath and Coronary Angiography;  Surgeon: Minna Merritts, MD;  Location: [place] CV LAB;  Service: Cardiovascular;  Laterality: N/A;  . Cardiac catheterization Left 06/04/2015    Procedure: Left Heart Cath and Coronary Angiography;  Surgeon: Wellington Hampshire, MD;  Location: [place] CV LAB;  Service: Cardiovascular;  Laterality: Left;  . Cardiac catheterization N/A 06/04/2015    Procedure: Coronary Stent Intervention;  Surgeon: Wellington Hampshire, MD;  Location: [place] CV LAB;  Service: Cardiovascular;  Laterality: N/A;    Social History  reports that she quit smoking about 20 years ago. Her smoking use included Cigarettes. She has never used smokeless tobacco. She reports that she does not drink alcohol or use illicit drugs.  Family History family history includes Anxiety disorder in her father and mother; Bipolar disorder in her mother; Colon cancer in her father; Depression in her father, mother, and sister; Diabetes in her father and sister; Heart disease in her father and  mother; Hyperlipidemia in her mother and sister; Hypertension in her father, mother, sister, and sister; Kidney disease in her father and sister; Skin cancer in her father; Stroke in her father; Thyroid nodules in her sister.   Review of Systems  Respiratory: Negative.   Cardiovascular: Negative.   Gastrointestinal: Negative.   Musculoskeletal: Negative.   Neurological: Negative.   Hematological: Negative.   All other systems reviewed and are negative.   BP 150/90 mmHg  Pulse 65  Ht 5\' 3"  (1.6 m)  Wt 194 lb (87.998 kg)  BMI 34.37 kg/m2  LMP  (  Approximate)  Physical Exam  Constitutional: She is oriented to person, place, and time. She appears well-developed and well-nourished.  HENT:  Head: Normocephalic.  Nose: Nose normal.  Mouth/Throat: Oropharynx is clear and moist.  Eyes: Conjunctivae are normal. Pupils are equal, round, and reactive to light.  Neck: Normal range of motion. Neck supple. No JVD present.  Cardiovascular: Normal rate, regular rhythm, S1 normal, S2 normal, normal heart sounds and intact distal pulses.  Exam reveals no gallop and no friction rub.   No murmur heard. Pulmonary/Chest: Effort normal and breath sounds normal. No respiratory distress. She has no wheezes. She has no rales. She exhibits no tenderness.  Abdominal: Soft. Bowel sounds are normal. She exhibits no distension. There is no tenderness.  Musculoskeletal: Normal range of motion. She exhibits no edema or tenderness.  Lymphadenopathy:    She has no cervical adenopathy.  Neurological: She is alert and oriented to person, place, and time. Coordination normal.  Skin: Skin is warm and dry. No rash noted. No erythema.  Psychiatric: She has a normal mood and affect. Her behavior is normal. Judgment and thought content normal.    Assessment and Plan  Nursing note and vitals reviewed.

## 2015-06-13 NOTE — Assessment & Plan Note (Signed)
Recent stent placement to her diagonal vessel with improvement of her anginal symptoms Incarcerated to stay on her aspirin, Plavix, statin

## 2015-06-13 NOTE — Assessment & Plan Note (Signed)
We have given her a prescription for Lasix given recent weight gain, symptoms of shortness of breath and bloating consistent with fluid retention. Suggested she not take this every day given underlying renal dysfunction unless she talks with Dr. Holley Raring

## 2015-06-13 NOTE — Patient Instructions (Addendum)
You are doing well.  Please start lasix sparingly as needed for weight gain, shortness of breath Take with banana or OTC potassium Check with Dr. Holley Raring  Monitor blood pressure Call if numbers run high  Please call us if you have new issues that need to be addressed before your next appt.  Your physician wants you to follow-up in: 6 months.  You will receive a reminder letter in the mail two months in advance. If you don't receive a letter, please call our office to schedule the follow-up appointment.

## 2015-06-13 NOTE — Assessment & Plan Note (Signed)
We have encouraged continued exercise, careful diet management in an effort to lose weight. 

## 2015-06-13 NOTE — Assessment & Plan Note (Signed)
Etiology of her renal failure likely secondary to long-standing diabetes, poorly controlled blood pressure. Recent climb in her creatinine, unable to exclude contrast She has close follow-up with Dr. Holley Raring   Total encounter time more than 25 minutes  Greater than 50% was spent in counseling and coordination of care with the patient

## 2015-06-13 NOTE — Assessment & Plan Note (Signed)
Blood pressure elevated on today's visit We has suggested she monitor pressure at home, take Lasix for abdominal bloating, shortness of breath. She may need additional treatment of her blood pressure Options include adding hydralazine, Cardura, clonidine, etc. She is scheduled see Dr. Holley Raring in several weeks' time

## 2015-06-17 ENCOUNTER — Encounter: Payer: Self-pay | Admitting: Cardiovascular Disease

## 2015-06-19 ENCOUNTER — Inpatient Hospital Stay: Payer: Self-pay | Admitting: Family Medicine

## 2015-06-21 ENCOUNTER — Other Ambulatory Visit: Payer: Self-pay

## 2015-06-24 ENCOUNTER — Telehealth: Payer: Self-pay | Admitting: Family Medicine

## 2015-06-24 ENCOUNTER — Inpatient Hospital Stay: Payer: BC Managed Care – PPO | Attending: Oncology

## 2015-06-24 DIAGNOSIS — G35 Multiple sclerosis: Secondary | ICD-10-CM | POA: Insufficient documentation

## 2015-06-24 DIAGNOSIS — I129 Hypertensive chronic kidney disease with stage 1 through stage 4 chronic kidney disease, or unspecified chronic kidney disease: Secondary | ICD-10-CM | POA: Diagnosis not present

## 2015-06-24 DIAGNOSIS — R55 Syncope and collapse: Secondary | ICD-10-CM | POA: Diagnosis not present

## 2015-06-24 DIAGNOSIS — R42 Dizziness and giddiness: Secondary | ICD-10-CM | POA: Insufficient documentation

## 2015-06-24 DIAGNOSIS — Z8673 Personal history of transient ischemic attack (TIA), and cerebral infarction without residual deficits: Secondary | ICD-10-CM | POA: Diagnosis not present

## 2015-06-24 DIAGNOSIS — R61 Generalized hyperhidrosis: Secondary | ICD-10-CM | POA: Insufficient documentation

## 2015-06-24 DIAGNOSIS — Z85820 Personal history of malignant melanoma of skin: Secondary | ICD-10-CM | POA: Insufficient documentation

## 2015-06-24 DIAGNOSIS — F419 Anxiety disorder, unspecified: Secondary | ICD-10-CM | POA: Diagnosis not present

## 2015-06-24 DIAGNOSIS — Z808 Family history of malignant neoplasm of other organs or systems: Secondary | ICD-10-CM | POA: Insufficient documentation

## 2015-06-24 DIAGNOSIS — E785 Hyperlipidemia, unspecified: Secondary | ICD-10-CM | POA: Diagnosis not present

## 2015-06-24 DIAGNOSIS — I5022 Chronic systolic (congestive) heart failure: Secondary | ICD-10-CM | POA: Insufficient documentation

## 2015-06-24 DIAGNOSIS — Z79899 Other long term (current) drug therapy: Secondary | ICD-10-CM | POA: Diagnosis not present

## 2015-06-24 DIAGNOSIS — I729 Aneurysm of unspecified site: Secondary | ICD-10-CM | POA: Insufficient documentation

## 2015-06-24 DIAGNOSIS — E119 Type 2 diabetes mellitus without complications: Secondary | ICD-10-CM | POA: Insufficient documentation

## 2015-06-24 DIAGNOSIS — F329 Major depressive disorder, single episode, unspecified: Secondary | ICD-10-CM | POA: Insufficient documentation

## 2015-06-24 DIAGNOSIS — I255 Ischemic cardiomyopathy: Secondary | ICD-10-CM | POA: Insufficient documentation

## 2015-06-24 DIAGNOSIS — Q2547 Right aortic arch: Secondary | ICD-10-CM | POA: Insufficient documentation

## 2015-06-24 DIAGNOSIS — K219 Gastro-esophageal reflux disease without esophagitis: Secondary | ICD-10-CM | POA: Insufficient documentation

## 2015-06-24 DIAGNOSIS — N184 Chronic kidney disease, stage 4 (severe): Secondary | ICD-10-CM | POA: Diagnosis not present

## 2015-06-24 DIAGNOSIS — I2510 Atherosclerotic heart disease of native coronary artery without angina pectoris: Secondary | ICD-10-CM | POA: Insufficient documentation

## 2015-06-24 DIAGNOSIS — Z9884 Bariatric surgery status: Secondary | ICD-10-CM | POA: Diagnosis not present

## 2015-06-24 DIAGNOSIS — Z87891 Personal history of nicotine dependence: Secondary | ICD-10-CM | POA: Insufficient documentation

## 2015-06-24 DIAGNOSIS — Z8719 Personal history of other diseases of the digestive system: Secondary | ICD-10-CM | POA: Diagnosis not present

## 2015-06-24 DIAGNOSIS — R079 Chest pain, unspecified: Secondary | ICD-10-CM | POA: Insufficient documentation

## 2015-06-24 DIAGNOSIS — R531 Weakness: Secondary | ICD-10-CM | POA: Insufficient documentation

## 2015-06-24 DIAGNOSIS — I050 Rheumatic mitral stenosis: Secondary | ICD-10-CM | POA: Insufficient documentation

## 2015-06-24 DIAGNOSIS — Z7982 Long term (current) use of aspirin: Secondary | ICD-10-CM | POA: Diagnosis not present

## 2015-06-24 DIAGNOSIS — I951 Orthostatic hypotension: Secondary | ICD-10-CM | POA: Diagnosis not present

## 2015-06-24 DIAGNOSIS — Z800 Family history of malignant neoplasm of digestive organs: Secondary | ICD-10-CM | POA: Insufficient documentation

## 2015-06-24 DIAGNOSIS — E039 Hypothyroidism, unspecified: Secondary | ICD-10-CM | POA: Diagnosis not present

## 2015-06-24 DIAGNOSIS — D509 Iron deficiency anemia, unspecified: Secondary | ICD-10-CM | POA: Insufficient documentation

## 2015-06-24 DIAGNOSIS — Z8669 Personal history of other diseases of the nervous system and sense organs: Secondary | ICD-10-CM | POA: Diagnosis not present

## 2015-06-24 DIAGNOSIS — R5383 Other fatigue: Secondary | ICD-10-CM | POA: Insufficient documentation

## 2015-06-24 LAB — IRON AND TIBC
Iron: 35 ug/dL (ref 28–170)
Saturation Ratios: 13 % (ref 10.4–31.8)
TIBC: 267 ug/dL (ref 250–450)
UIBC: 232 ug/dL

## 2015-06-24 LAB — FERRITIN: Ferritin: 70 ng/mL (ref 11–307)

## 2015-06-24 LAB — CBC
HEMATOCRIT: 42.1 % (ref 35.0–47.0)
Hemoglobin: 14.2 g/dL (ref 12.0–16.0)
MCH: 26.4 pg (ref 26.0–34.0)
MCHC: 33.7 g/dL (ref 32.0–36.0)
MCV: 78.2 fL — AB (ref 80.0–100.0)
Platelets: 170 10*3/uL (ref 150–440)
RBC: 5.39 MIL/uL — ABNORMAL HIGH (ref 3.80–5.20)
RDW: 18.4 % — AB (ref 11.5–14.5)
WBC: 10.6 10*3/uL (ref 3.6–11.0)

## 2015-06-24 LAB — FOLATE: Folate: 16.3 ng/mL (ref >5.9)

## 2015-06-24 LAB — VITAMIN B12: Vitamin B-12: 824 pg/mL (ref 180–914)

## 2015-06-24 LAB — LACTATE DEHYDROGENASE: LDH: 106 U/L (ref 98–192)

## 2015-06-24 LAB — DAT, POLYSPECIFIC AHG (ARMC ONLY): Polyspecific AHG test: NEGATIVE

## 2015-06-24 NOTE — Telephone Encounter (Signed)
Please advise 

## 2015-06-24 NOTE — Telephone Encounter (Signed)
Pt is request to switch from pregabalin (LYRICA) 100 MG capsule to Gabapentin 300mg  due to the co-pay for pregabalin (LYRICA) 100 MG capsule is too expensive.  [facility] [place].  CB#[phone]/MW

## 2015-06-25 LAB — ERYTHROPOIETIN: Erythropoietin: 6.8 m[IU]/mL (ref 2.6–18.5)

## 2015-06-25 LAB — HAPTOGLOBIN: Haptoglobin: 208 mg/dL — ABNORMAL HIGH (ref 34–200)

## 2015-06-25 LAB — ANA W/REFLEX: Anti Nuclear Antibody(ANA): NEGATIVE

## 2015-06-27 ENCOUNTER — Other Ambulatory Visit: Payer: Self-pay | Admitting: Family Medicine

## 2015-06-27 MED ORDER — GABAPENTIN 300 MG PO CAPS: ORAL_CAPSULE | ORAL | Status: DC

## 2015-06-27 NOTE — Telephone Encounter (Signed)
rx has been sent to [facility] in [place] [facility]

## 2015-06-27 NOTE — Telephone Encounter (Signed)
Pt is calling to see if this was approved. A note was sent for pt and her husband.  Pt states he husbands Rx was sent to the pharmacy but hers was not.  [phone]

## 2015-06-27 NOTE — Telephone Encounter (Signed)
Please advise message below  °

## 2015-06-28 ENCOUNTER — Inpatient Hospital Stay: Payer: BC Managed Care – PPO

## 2015-06-28 ENCOUNTER — Inpatient Hospital Stay (HOSPITAL_BASED_OUTPATIENT_CLINIC_OR_DEPARTMENT_OTHER): Payer: BC Managed Care – PPO | Admitting: Oncology

## 2015-06-28 VITALS — BP 108/78 | HR 92 | Temp 98.9°F | Resp 18 | Wt 187.2 lb

## 2015-06-28 DIAGNOSIS — K219 Gastro-esophageal reflux disease without esophagitis: Secondary | ICD-10-CM

## 2015-06-28 DIAGNOSIS — Z800 Family history of malignant neoplasm of digestive organs: Secondary | ICD-10-CM

## 2015-06-28 DIAGNOSIS — Q2547 Right aortic arch: Secondary | ICD-10-CM

## 2015-06-28 DIAGNOSIS — I050 Rheumatic mitral stenosis: Secondary | ICD-10-CM

## 2015-06-28 DIAGNOSIS — E785 Hyperlipidemia, unspecified: Secondary | ICD-10-CM

## 2015-06-28 DIAGNOSIS — R531 Weakness: Secondary | ICD-10-CM | POA: Diagnosis not present

## 2015-06-28 DIAGNOSIS — N184 Chronic kidney disease, stage 4 (severe): Secondary | ICD-10-CM

## 2015-06-28 DIAGNOSIS — I951 Orthostatic hypotension: Secondary | ICD-10-CM

## 2015-06-28 DIAGNOSIS — R42 Dizziness and giddiness: Secondary | ICD-10-CM

## 2015-06-28 DIAGNOSIS — F329 Major depressive disorder, single episode, unspecified: Secondary | ICD-10-CM

## 2015-06-28 DIAGNOSIS — E119 Type 2 diabetes mellitus without complications: Secondary | ICD-10-CM

## 2015-06-28 DIAGNOSIS — R55 Syncope and collapse: Secondary | ICD-10-CM

## 2015-06-28 DIAGNOSIS — I2510 Atherosclerotic heart disease of native coronary artery without angina pectoris: Secondary | ICD-10-CM

## 2015-06-28 DIAGNOSIS — R079 Chest pain, unspecified: Secondary | ICD-10-CM

## 2015-06-28 DIAGNOSIS — I255 Ischemic cardiomyopathy: Secondary | ICD-10-CM

## 2015-06-28 DIAGNOSIS — I729 Aneurysm of unspecified site: Secondary | ICD-10-CM

## 2015-06-28 DIAGNOSIS — D509 Iron deficiency anemia, unspecified: Secondary | ICD-10-CM | POA: Diagnosis not present

## 2015-06-28 DIAGNOSIS — Z8673 Personal history of transient ischemic attack (TIA), and cerebral infarction without residual deficits: Secondary | ICD-10-CM

## 2015-06-28 DIAGNOSIS — Z8669 Personal history of other diseases of the nervous system and sense organs: Secondary | ICD-10-CM

## 2015-06-28 DIAGNOSIS — Z8719 Personal history of other diseases of the digestive system: Secondary | ICD-10-CM

## 2015-06-28 DIAGNOSIS — Z9884 Bariatric surgery status: Secondary | ICD-10-CM

## 2015-06-28 DIAGNOSIS — I129 Hypertensive chronic kidney disease with stage 1 through stage 4 chronic kidney disease, or unspecified chronic kidney disease: Secondary | ICD-10-CM

## 2015-06-28 DIAGNOSIS — Z7982 Long term (current) use of aspirin: Secondary | ICD-10-CM

## 2015-06-28 DIAGNOSIS — R5383 Other fatigue: Secondary | ICD-10-CM

## 2015-06-28 DIAGNOSIS — R61 Generalized hyperhidrosis: Secondary | ICD-10-CM

## 2015-06-28 DIAGNOSIS — Z87891 Personal history of nicotine dependence: Secondary | ICD-10-CM

## 2015-06-28 DIAGNOSIS — Z808 Family history of malignant neoplasm of other organs or systems: Secondary | ICD-10-CM

## 2015-06-28 DIAGNOSIS — E039 Hypothyroidism, unspecified: Secondary | ICD-10-CM

## 2015-06-28 DIAGNOSIS — I5022 Chronic systolic (congestive) heart failure: Secondary | ICD-10-CM

## 2015-06-28 DIAGNOSIS — Z85820 Personal history of malignant melanoma of skin: Secondary | ICD-10-CM

## 2015-06-28 DIAGNOSIS — F419 Anxiety disorder, unspecified: Secondary | ICD-10-CM

## 2015-06-28 DIAGNOSIS — G35 Multiple sclerosis: Secondary | ICD-10-CM

## 2015-06-28 DIAGNOSIS — Z79899 Other long term (current) drug therapy: Secondary | ICD-10-CM

## 2015-06-28 NOTE — Progress Notes (Signed)
[facility]  [phone] [phone]  ID: Wilhemina Cash OB: [birth year]  MR#: [id number]  [id number]  Patient Care Team: [name], MD as PCP - General (Family Medicine) Dagoberto Ligas, MD as Consulting Physician (Nephrology) Minna Merritts, MD as Consulting Physician (Cardiology) Vladimir Crofts, MD as Consulting Physician (Neurology) Irene Shipper, MD as Consulting Physician (Gastroenterology)  CHIEF COMPLAINT:  Chief Complaint  Patient presents with  . Anemia    INTERVAL HISTORY: Patient is here today for labs and further evaluation and consideration of IV feraheme. She still feels tired but otherwise well. She has no other neurologic complaints. She denies any recent fevers or illnesses. She has a good appetite and denies weight loss. She denies any chest pain or shortness of breath. She denies any nausea, vomiting, constipation, or diarrhea. She has no urinary complaints. Patient otherwise feels well and offers no further specific complaints.  REVIEW OF SYSTEMS:   Review of Systems  Constitutional: Positive for malaise/fatigue. Negative for fever and weight loss.  Respiratory: Negative.  Negative for cough and shortness of breath.   Cardiovascular: Negative.  Negative for chest pain and palpitations.  Gastrointestinal: Negative.  Negative for blood in stool and melena.  Musculoskeletal: Negative.   Neurological: Negative for loss of consciousness and weakness.    As per HPI. Otherwise, a complete review of systems is negatve.  PAST MEDICAL HISTORY: Past Medical History  Diagnosis Date  . Colon polyp   . GERD (gastroesophageal reflux disease)   . CAD (coronary artery disease)     a. cath 11/2014: LM: nl, mLAD 85-90% s/p PCI/DES (Xience 3.25 x 23), mLCX 30%, RCA nl;; b. cath 04/2015: 70% mD1 @ bifurcation. Patent LAD stent - > Med Rx.; c. Cath-PCI 5/'17: Patent LAD stent, D1 now ~80% - PCI Resolute DES 2.25 x 12.    Marland Kitchen CVA  (cerebral vascular accident) ([place])     a. 06/2008-TEE nl LV fxn; b. 01/2013 - notes indicate short run of SVT at that time  . Multiple sclerosis ([place]) 2015  . Syncope   . Chronic systolic CHF (congestive heart failure) ([place])     a. echo 01/29/2013 EF 40-45%, basal anteroseptal & mid anteroseptal segments abnl; b echo 05/2012 EF 50-55%, mildly dilated LA, nl RVSP  . Orthostatic hypotension   . Aortic arch aneurysm ([place])   . Ischemic cardiomyopathy   . MS (mitral stenosis)   . Malignant melanoma of skin of scalp ([place])   . Hypertension   . Depression   . Diabetes mellitus without complication ([place])   . Headache     Migrains  . Hyperlipidemia   . CKD (chronic kidney disease), stage IV ([place])   . Hypothyroidism   . Anxiety   . Gastric ulcer 04/27/2011    PAST SURGICAL HISTORY: Past Surgical History  Procedure Laterality Date  . Appendectomy    . Right oophorectomy    . Tubal ligation      R tube removed still w/ Left  . Dilation and curettage of uterus    . Gastric bypass  09/2009    [facility]   . Esophagogastroduodenoscopy (egd) with propofol N/A 09/14/2014    Procedure: ESOPHAGOGASTRODUODENOSCOPY (EGD) WITH PROPOFOL;  Surgeon: Josefine Class, MD;  Location: [facility] ENDOSCOPY;  Service: Endoscopy;  Laterality: N/A;  . Cardiac catheterization N/A 11/09/2014    Procedure: Coronary Angiography;  Surgeon: Minna Merritts, MD;  Location: [place] CV LAB;  Service: Cardiovascular;  Laterality: N/A;  . Cardiac catheterization N/A 11/12/2014    Procedure: Coronary Stent Intervention;  Surgeon: Isaias Cowman, MD;  Location: [place] CV LAB;  Service: Cardiovascular;  Laterality: N/A;  . Trigger finger release    . Melanoma excision  2016    Dr. Evorn Gong  . Cardiac catheterization N/A 04/18/2015    Procedure: Left Heart Cath and Coronary Angiography;  Surgeon: Minna Merritts, MD;  Location: [place] CV LAB;  Service: Cardiovascular;  Laterality: N/A;    . Cardiac catheterization Left 06/04/2015    Procedure: Left Heart Cath and Coronary Angiography;  Surgeon: Wellington Hampshire, MD;  Location: [place] CV LAB;  Service: Cardiovascular;  Laterality: Left;  . Cardiac catheterization N/A 06/04/2015    Procedure: Coronary Stent Intervention;  Surgeon: Wellington Hampshire, MD;  Location: [place] CV LAB;  Service: Cardiovascular;  Laterality: N/A;    FAMILY HISTORY Family History  Problem Relation Age of Onset  . Hypertension Mother   . Anxiety disorder Mother   . Depression Mother   . Bipolar disorder Mother   . Heart disease Mother   . Hyperlipidemia Mother   . Kidney disease Father   . Heart disease Father   . Hypertension Father   . Diabetes Father   . Stroke Father   . Colon cancer Father     dx in his 29's  . Anxiety disorder Father   . Depression Father   . Skin cancer Father   . Kidney disease Sister   . Thyroid nodules Sister   . Hypertension Sister   . Hypertension Sister   . Diabetes Sister   . Hyperlipidemia Sister   . Depression Sister        ADVANCED DIRECTIVES:    HEALTH MAINTENANCE: Social History  Substance Use Topics  . Smoking status: Former Smoker    Types: Cigarettes    Quit date: 08/31/1994  . Smokeless tobacco: Never Used     Comment: quit 28 years ago  . Alcohol Use: No     No Known Allergies  Current Outpatient Prescriptions  Medication Sig Dispense Refill  . aspirin EC 81 MG tablet Take 1 tablet (81 mg total) by mouth daily.    . baclofen (LIORESAL) 10 MG tablet Take 1 tablet (10 mg total) by mouth 2 (two) times daily as needed for muscle spasms.  0  . canagliflozin (INVOKANA) 100 MG TABS tablet Take 1 tablet (100 mg total) by mouth daily before breakfast. 30 tablet 5  . carvedilol (COREG) 25 MG tablet Take 1 tablet (25 mg total) by mouth 2 (two) times daily. 60 tablet 6  . citalopram (CELEXA) 40 MG tablet Take 1 tablet (40 mg total) by mouth daily. 30 tablet 3  . clopidogrel  (PLAVIX) 75 MG tablet Take 1 tablet (75 mg total) by mouth daily. 30 tablet 5  . diltiazem (CARDIZEM CD) 120 MG 24 hr capsule Take 1 capsule (120 mg total) by mouth daily. 30 capsule 5  . eszopiclone (LUNESTA) 2 MG TABS tablet Take 2 mg by mouth at bedtime as needed for sleep. Take immediately before bedtime    . furosemide (LASIX) 20 MG tablet Take 1 tablet (20 mg total) by mouth daily as needed. 90 tablet 3  . gabapentin (NEURONTIN) 300 MG capsule One tablet at bedtime for 2 days, then one twice daily for two days, then one three times daily 90 capsule 1  . isosorbide mononitrate (IMDUR) 30 MG 24 hr tablet Take 1 tablet (  30 mg total) by mouth at bedtime. 15 tablet 0  . isosorbide mononitrate (IMDUR) 60 MG 24 hr tablet Take 1 tablet (60 mg total) by mouth daily after breakfast. 30 tablet 5  . levothyroxine (SYNTHROID, LEVOTHROID) 25 MCG tablet Take 1 tablet (25 mcg total) by mouth daily before breakfast. 30 tablet 5  . losartan (COZAAR) 25 MG tablet Take 25 mg by mouth daily.    . metFORMIN (GLUCOPHAGE) 1000 MG tablet Take 1,000 mg by mouth at bedtime.    . Multiple Vitamin (MULTIVITAMIN WITH MINERALS) TABS tablet Take 1 tablet by mouth daily.    . nitroGLYCERIN (NITROSTAT) 0.4 MG SL tablet Place 1 tablet (0.4 mg total) under the tongue every 5 (five) minutes as needed for chest pain. 25 tablet 6  . pantoprazole (PROTONIX) 40 MG tablet Take 1 tablet (40 mg total) by mouth 2 (two) times daily. 60 tablet 5  . promethazine (PHENERGAN) 25 MG tablet Take 25 mg by mouth every 6 (six) hours as needed for nausea or vomiting. Reported on 05/31/2015    . ranolazine (RANEXA) 1000 MG SR tablet Take 1 tablet (1,000 mg total) by mouth 2 (two) times daily. 60 tablet 6  . rosuvastatin (CRESTOR) 40 MG tablet Take 1 tablet (40 mg total) by mouth daily at 6 PM. 90 tablet 3  . traZODone (DESYREL) 100 MG tablet Take 200 mg by mouth at bedtime.     No current facility-administered medications for this visit.     OBJECTIVE: Filed Vitals:   06/28/15 1026  BP: 108/78  Pulse: 92  Temp: 98.9 F (37.2 C)  Resp: 18     Body mass index is 33.16 kg/(m^2).    ECOG FS:0 - Asymptomatic  General: Well-developed, well-nourished, no acute distress. Eyes: Pink conjunctiva, anicteric sclera. HEENT: Normocephalic, moist mucous membranes, clear oropharnyx. Lungs: Clear to auscultation bilaterally. Heart: Regular rate and rhythm. No rubs, murmurs, or gallops. Abdomen: Soft, nontender, nondistended. No organomegaly noted, normoactive bowel sounds. Musculoskeletal: No edema, cyanosis, or clubbing. Neuro: Alert, answering all questions appropriately. Cranial nerves grossly intact. Skin: No rashes or petechiae noted. Psych: Normal affect.  LAB RESULTS:  Lab Results  Component Value Date   NA 141 06/05/2015   K 4.5 06/05/2015   CL 108 06/05/2015   CO2 26 06/05/2015   GLUCOSE 109* 06/05/2015   BUN 28* 06/05/2015   CREATININE 1.63* 06/05/2015   CALCIUM 8.8* 06/05/2015   PROT 7.1 06/03/2015   ALBUMIN 3.9 06/03/2015   AST 23 06/03/2015   ALT 24 06/03/2015   ALKPHOS 90 06/03/2015   BILITOT 0.5 06/03/2015   GFRNONAA 37* 06/05/2015   GFRAA 43* 06/05/2015    Lab Results  Component Value Date   WBC 10.6 06/24/2015   NEUTROABS 6.4 03/14/2015   HGB 14.2 06/24/2015   HCT 42.1 06/24/2015   MCV 78.2* 06/24/2015   PLT 170 06/24/2015   Lab Results  Component Value Date   IRON 35 06/24/2015   TIBC 267 06/24/2015   IRONPCTSAT 13 06/24/2015    Lab Results  Component Value Date   FERRITIN 70 06/24/2015     STUDIES: Dg Chest 2 View  05/31/2015  CLINICAL DATA:  Chest pain beginning a few days ago. Pain now radiates into the left arm. Diaphoresis and lightheadedness. Initial encounter. No known injury. EXAM: CHEST  2 VIEW COMPARISON:  PA and lateral chest 04/17/2015 and 06/03/2014. FINDINGS: The lungs are clear. Heart size is normal. No pneumothorax or pleural effusion. Surgical clips in the left  upper chest are noted. No bony abnormality. IMPRESSION: No acute disease. Electronically Signed   By: Inge Rise M.D.   On: 05/31/2015 11:05   Ct Angio Chest Aorta W/cm &/or Wo/cm  05/31/2015  CLINICAL DATA:  Shortness of breath and back pain for 2 days, concern for dissection EXAM: CT ANGIOGRAPHY CHEST WITH CONTRAST TECHNIQUE: Multidetector CT imaging of the chest was performed using the standard protocol during bolus administration of intravenous contrast. Multiplanar CT image reconstructions and MIPs were obtained to evaluate the vascular anatomy. CONTRAST:  80 mL Isovue 370 COMPARISON:  03/26/2014 FINDINGS: Right-sided aortic arch again identified with closure of left subclavian artery. Right subclavian right carotid left carotid arteries are patent. Possible bypass from left common carotid to left subclavian. No evidence of aortic dissection. Pulmonary arteries not the target of this examination but central pulmonary arteries grossly negative. Mild cardiac enlargement. No pericardial effusion. Lungs are clear with no pleural effusion. Aberrant subclavian artery and closure device again exerts mass effect on the esophagus which is situated between the adherent closed artery and the trachea. No acute musculoskeletal findings. No acute abnormalities in the upper abdomen. Review of the MIP images confirms the above findings. IMPRESSION: No evidence of aortic dissection. Right aortic arch with left a variant subclavian artery, closure, and bypass from left common carotid. Similar appearance to prior study. Electronically Signed   By: Skipper Cliche M.D.   On: 05/31/2015 12:57    ASSESSMENT: Iron deficiency anemia.  PLAN:    1. Iron deficiency anemia: Possibly secondary to poor absorption given patient's history of gastric bypass surgery. Patient's hemoglobin and iron stores continue to be within normal limits. Previous, her anemia workup was also either negative or within normal limits. Patient does  take oral iron.  No IV feraheme needed today. Patient will then return to clinic in 3 months with repeat laboratory work and further evaluation.    Patient expressed understanding and was in agreement with this plan. She also understands that She can call clinic at any time with any questions, concerns, or complaints.   Mayra Reel, NP   06/28/2015 10:47 AM  Patient was seen and evaluated independently and I agree with the assessment and plan as dictated above.  Lloyd Huger, MD 06/28/2015 4:42 PM

## 2015-07-01 IMAGING — RF DG BARIUM SWALLOW
9 of 11 series · 14 of 22 positions shown · non-contrast
Comparison: No priors.

CLINICAL DATA: Difficulty swallowing solid foods and pills. History
of acid reflux disease.

EXAM:
ESOPHOGRAM / BARIUM SWALLOW / BARIUM TABLET STUDY
TECHNIQUE: Combined double contrast and single contrast examination performed
using effervescent crystals, thick barium liquid, and thin barium
liquid. The patient was observed with fluoroscopy swallowing a 13mm
barium sulphate tablet.
FLUOROSCOPY TIME:  2 min and 36 seconds.

[Series 1: fluoro_barium 2fps_bw · 0.17mm/px · 1 of 1 slices shown (1 of 8)]
[im 1/1]
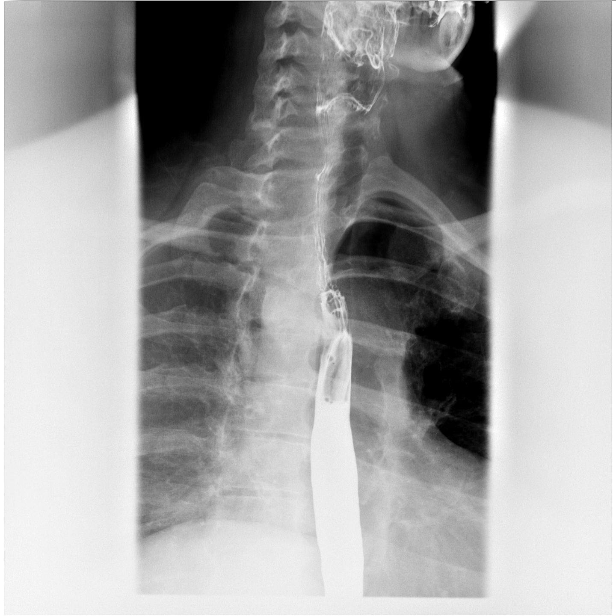

[Series 3: fluoro_barium 2fps_bw · 0.17mm/px · 1 of 1 slices shown (2 of 8)]
[im 1/1]
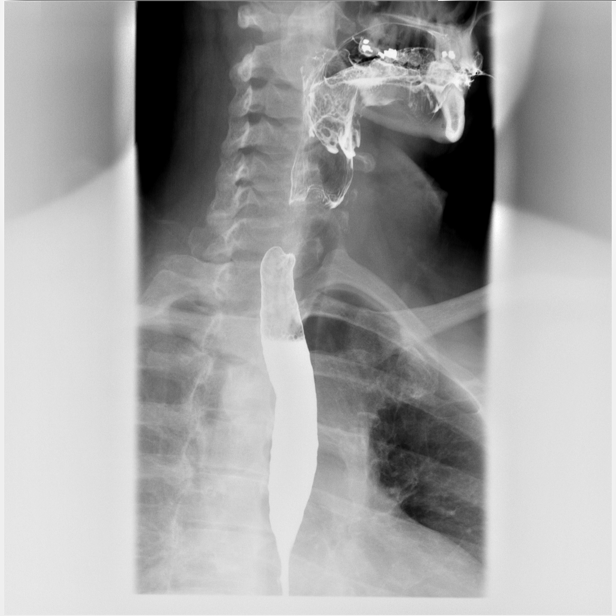

[Series 4: fluoro_barium 2fps_bw · 0.17mm/px · 1 of 2 frames shown (3 of 8)]
[frame 1/2]
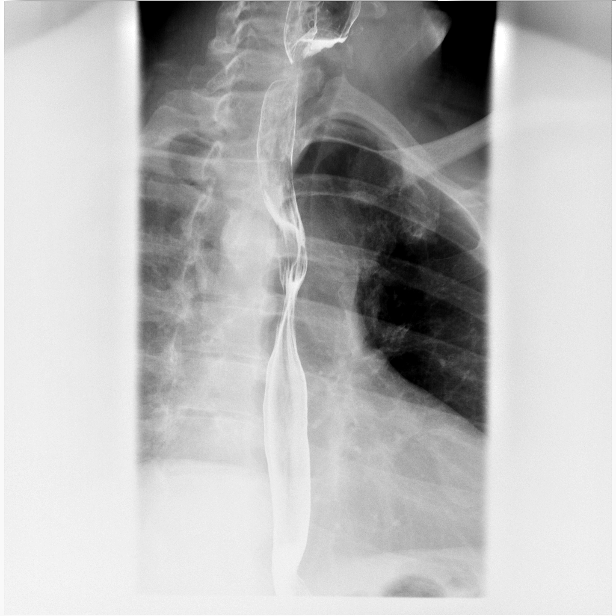

[Series 5: fluoro_barium 2fps_bw · 0.17mm/px · 1 of 1 slices shown (4 of 8)]
[im 1/1]
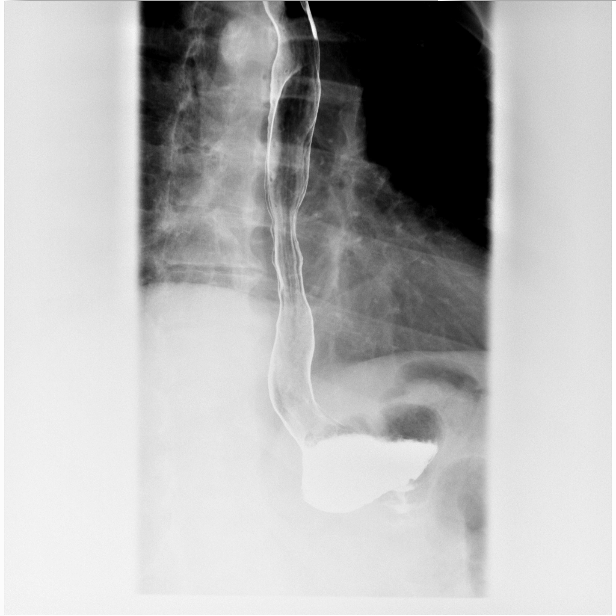

[Series 6: fluoro_barium 2fps_bw · 0.17mm/px · 1 of 2 frames shown (5 of 8)]
[frame 2/2]
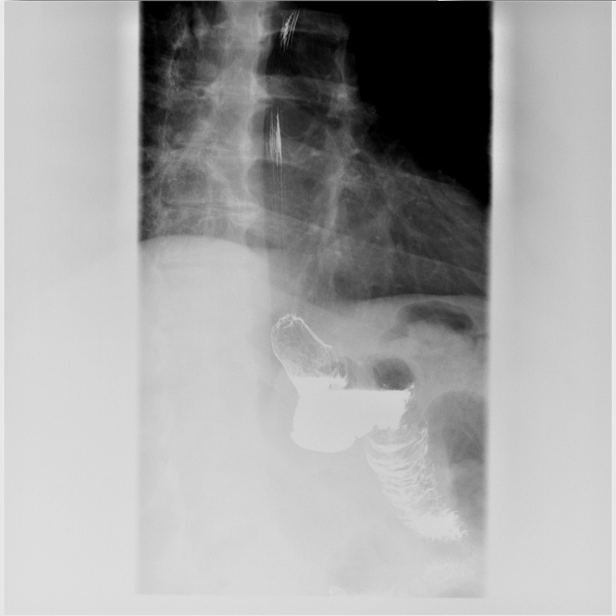

[Series 7: fluoro_barium 2fps_bw · 0.19mm/px · 1 of 1 slices shown (6 of 8)]
[im 1/1]
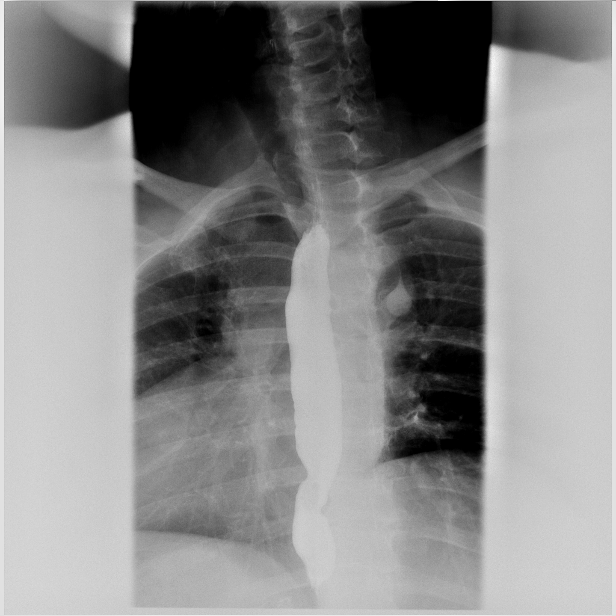

[Series 8: fluoro_barium 2fps_bw · 0.19mm/px · 1 of 2 frames shown (7 of 8)]
[frame 2/2]
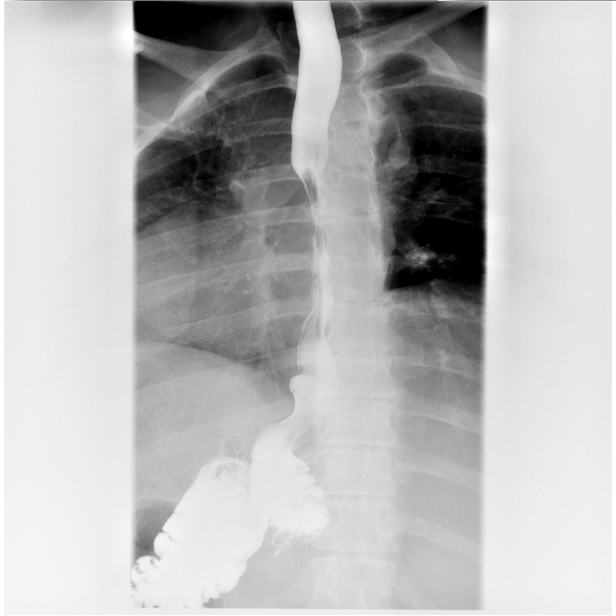

[Series 9: fluoro_barium 2fps_bw · 0.19mm/px · 1 of 1 slices shown (8 of 8)]
[im 1/1]
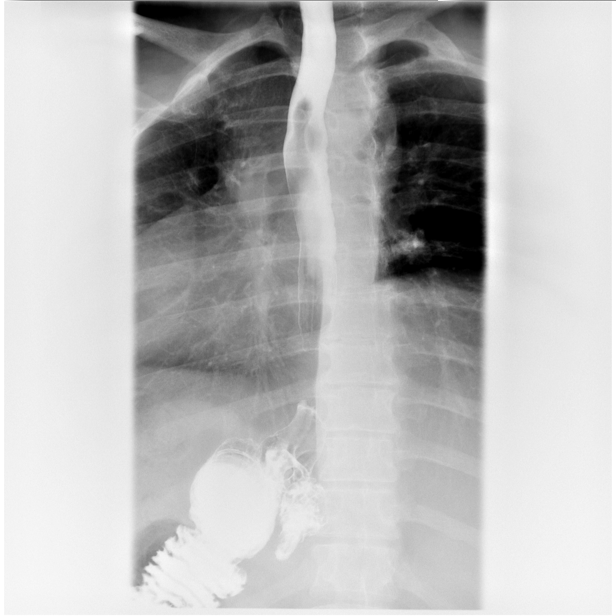

[Series 11: cp_standard · 0.29mm/px · 6 of 9 slices shown]
[im 1/9]
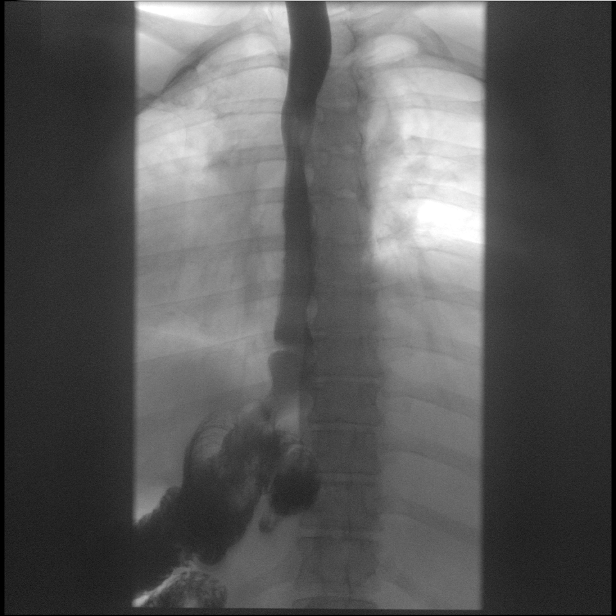
[im 2/9]
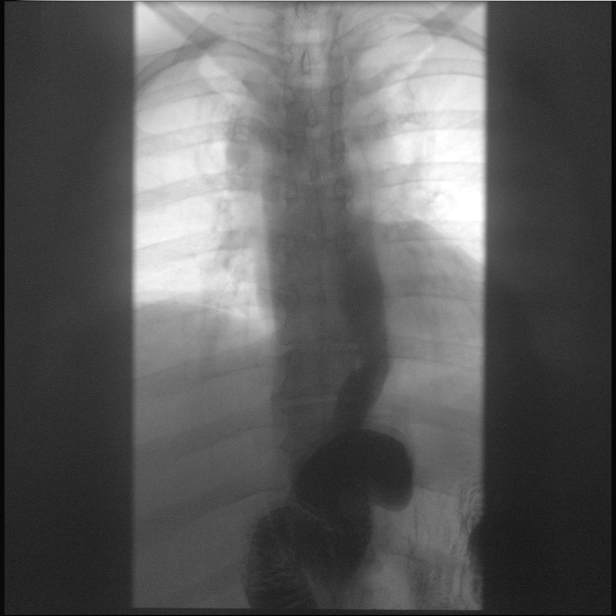
[im 4/9]
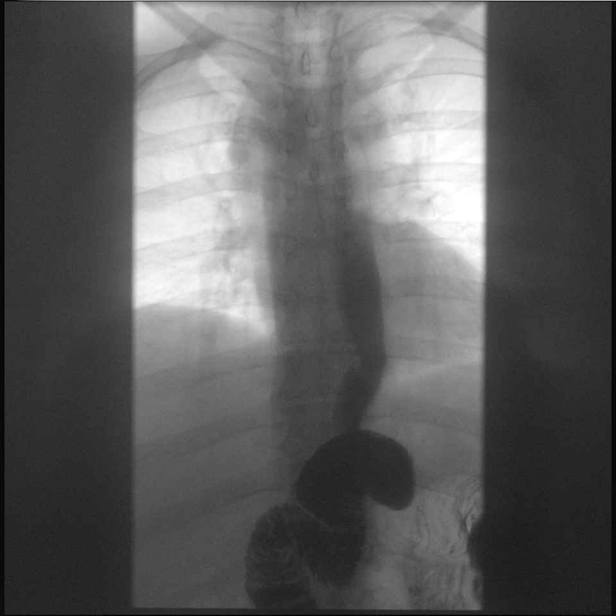
[im 6/9]
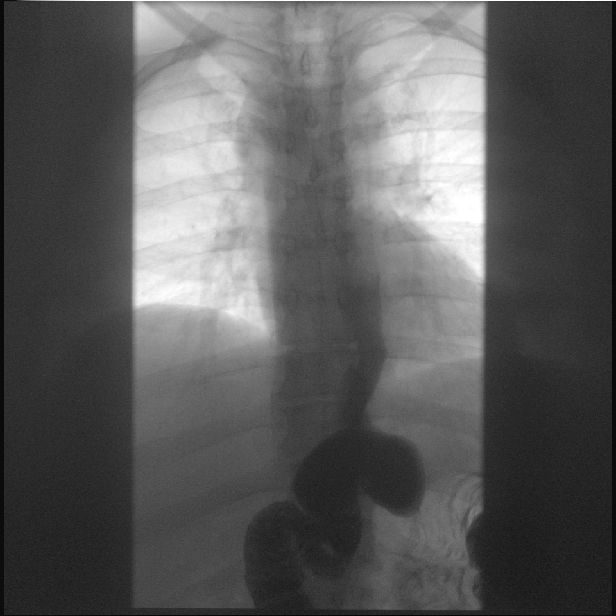
[im 7/9]
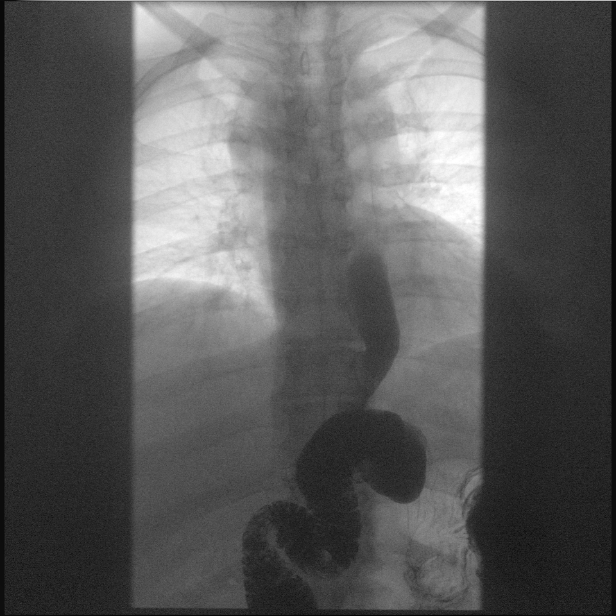
[im 9/9]
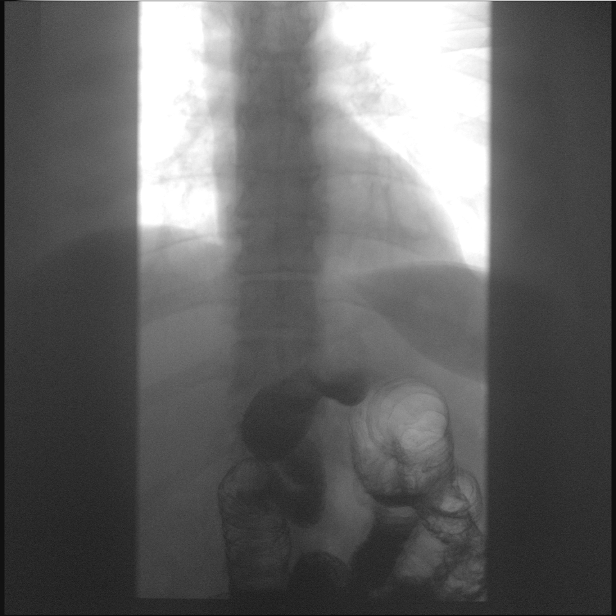

[14 of 22 positions shown; findings below may reference images not displayed]

FINDINGS: Initial double contrast images of the esophagus demonstrated a
normal appearance of the esophageal mucosa. Multiple single swallow
attempts were observed, which demonstrated normal esophageal
motility. Full column esophagram demonstrated no evidence of
esophageal mass, stricture or esophageal ring. A small hiatal hernia
was noted. Gastroesophageal reflux was observed during the water
siphon test. A barium tablet was administered which passed readily
into the stomach.
IMPRESSION: 1. Normal esophageal motility.
2. Small hiatal hernia.
3. Study was positive for gastroesophageal reflux.

## 2015-07-04 ENCOUNTER — Telehealth: Payer: Self-pay | Admitting: Cardiovascular Disease

## 2015-07-04 NOTE — Telephone Encounter (Signed)
Routed to Dr. Nicholaus Bloom office [phone].

## 2015-07-04 NOTE — Telephone Encounter (Signed)
Received cardiac clearance request for pt to proceed w/ Rt shoulder arthroscopy w/ debridement, decompression and possible rotator cuff repair w/ Dr. [name]. Surgery has not been scheduled yet, pending this clearance. Please fax response to [phone].

## 2015-07-04 NOTE — Telephone Encounter (Signed)
Recent cardiac catheterization, stent placement 06/04/2015 Per Dr. Fletcher Anon: "Dual antiplatelet therapy for at least 6 months and preferably 12 months. " That would mean no surgery for at least 6 months, preferably 12 Unable to stop blood thinners

## 2015-07-08 ENCOUNTER — Encounter: Payer: Self-pay | Admitting: Cardiovascular Disease

## 2015-07-12 ENCOUNTER — Other Ambulatory Visit: Payer: Self-pay | Admitting: Family Medicine

## 2015-08-08 ENCOUNTER — Ambulatory Visit: Payer: Self-pay | Admitting: Family Medicine

## 2015-08-14 ENCOUNTER — Ambulatory Visit (INDEPENDENT_AMBULATORY_CARE_PROVIDER_SITE_OTHER): Payer: BC Managed Care – PPO | Admitting: Family Medicine

## 2015-08-14 ENCOUNTER — Encounter: Payer: Self-pay | Admitting: Family Medicine

## 2015-08-14 VITALS — BP 138/70 | HR 94 | Temp 98.8°F | Resp 16 | Wt 193.0 lb

## 2015-08-14 DIAGNOSIS — N3000 Acute cystitis without hematuria: Secondary | ICD-10-CM

## 2015-08-14 DIAGNOSIS — G4700 Insomnia, unspecified: Secondary | ICD-10-CM | POA: Diagnosis not present

## 2015-08-14 DIAGNOSIS — E118 Type 2 diabetes mellitus with unspecified complications: Secondary | ICD-10-CM | POA: Diagnosis not present

## 2015-08-14 DIAGNOSIS — R300 Dysuria: Secondary | ICD-10-CM | POA: Diagnosis not present

## 2015-08-14 LAB — POCT URINALYSIS DIPSTICK
BILIRUBIN UA: NEGATIVE
Blood, UA: NEGATIVE
GLUCOSE UA: 2000
Ketones, UA: NEGATIVE
LEUKOCYTES UA: NEGATIVE
NITRITE UA: NEGATIVE
Protein, UA: NEGATIVE
Spec Grav, UA: <=1.005
Urobilinogen, UA: 0.2
pH, UA: 6

## 2015-08-14 LAB — POCT GLYCOSYLATED HEMOGLOBIN (HGB A1C)
ESTIMATED AVERAGE GLUCOSE: 163
Hemoglobin A1C: 7.3

## 2015-08-14 MED ORDER — FLUCONAZOLE 150 MG PO TABS
150.0000 mg | ORAL_TABLET | Freq: Once | ORAL | Status: DC
Start: 2015-08-14 — End: 2015-09-27

## 2015-08-14 MED ORDER — ESZOPICLONE 2 MG PO TABS: 2.0000 mg | ORAL_TABLET | Freq: Every evening | ORAL | Status: DC | PRN

## 2015-08-14 NOTE — Progress Notes (Signed)
Patient: [name] Female    DOB: 1970-02-21   45 y.o.   MRN: [id number] Visit Date: 08/14/2015  Today's Provider: [name], MD   Chief Complaint  Patient presents with  . Diabetes    follow up  . Dysuria   Subjective:    HPI  Diabetes Mellitus Type II, Follow-up:   Lab Results  Component Value Date   HGBA1C 5.8 04/05/2015   HGBA1C 5.8 01/03/2015   HGBA1C 5.7 03/20/2014    Last seen for diabetes 4 months ago.  Management since then includes no changes. She reports good compliance with treatment. She is not having side effects.  Current symptoms include paresthesia of the feet, polydipsia, polyuria and visual disturbances and have been stable. Home blood sugar records: not being checked  Episodes of hypoglycemia? no   Current Insulin Regimen: none Most Recent Eye Exam: <1 year Weight trend: increasing steadily Prior visit with dietician: no Current diet: well balanced Current exercise: none  Pertinent Labs:    Component Value Date/Time   CHOL 164 11/09/2014 0454   CHOL 197 12/30/2013 0355   TRIG 259* 11/09/2014 0454   TRIG 261* 12/30/2013 0355   HDL 27* 11/09/2014 0454   HDL 28* 12/30/2013 0355   LDLCALC 85 11/09/2014 0454   LDLCALC 117* 12/30/2013 0355   CREATININE 1.63* 06/05/2015 0413   CREATININE 1.75* 12/30/2013 0355   CREATININE 1.6* 02/03/2013    Wt Readings from Last 3 Encounters:  08/14/15 193 lb (87.544 kg)  06/28/15 187 lb 2.7 oz (84.9 kg)  06/13/15 194 lb (87.998 kg)    ------------------------------------------------------------------------  Follow up Neuropathy:  Recent medication change was made 2 months ago. Lyrica was changed  to Gabapentin. Patient  Reports good compliance with treatment, good tolerance and fair symptom control.   Urinary Symptoms:  Patient comes in stating he has been having urinary frequency, low back pain and burning during urination for several months off and on. Patient was last treated  for UTI 2-3 weeks ago by Dr. Holley Raring with Augmentin but states didn't notice much improvement     No Known Allergies Current Meds  Medication Sig  . aspirin EC 81 MG tablet Take 1 tablet (81 mg total) by mouth daily.  . baclofen (LIORESAL) 10 MG tablet Take 1 tablet (10 mg total) by mouth 2 (two) times daily as needed for muscle spasms.  . canagliflozin (INVOKANA) 100 MG TABS tablet Take 1 tablet (100 mg total) by mouth daily before breakfast.  . citalopram (CELEXA) 40 MG tablet Take 1 tablet (40 mg total) by mouth daily.  . clopidogrel (PLAVIX) 75 MG tablet Take 1 tablet (75 mg total) by mouth daily.  . eszopiclone (LUNESTA) 2 MG TABS tablet Take 2 mg by mouth at bedtime as needed for sleep. Take immediately before bedtime  . furosemide (LASIX) 20 MG tablet Take 1 tablet (20 mg total) by mouth daily as needed.  . gabapentin (NEURONTIN) 300 MG capsule One tablet at bedtime for 2 days, then one twice daily for two days, then one three times daily  . levothyroxine (SYNTHROID, LEVOTHROID) 25 MCG tablet Take 1 tablet (25 mcg total) by mouth daily before breakfast.  . losartan (COZAAR) 25 MG tablet Take 25 mg by mouth daily.  . metFORMIN (GLUCOPHAGE) 1000 MG tablet Take 1,000 mg by mouth at bedtime.  . Multiple Vitamin (MULTIVITAMIN WITH MINERALS) TABS tablet Take 1 tablet by mouth daily.  . nitroGLYCERIN (NITROSTAT) 0.4 MG SL  tablet Place 1 tablet (0.4 mg total) under the tongue every 5 (five) minutes as needed for chest pain.  . pantoprazole (PROTONIX) 40 MG tablet Take 1 tablet (40 mg total) by mouth 2 (two) times daily.  . promethazine (PHENERGAN) 25 MG tablet Take 25 mg by mouth every 6 (six) hours as needed for nausea or vomiting. Reported on 05/31/2015  . rosuvastatin (CRESTOR) 40 MG tablet Take 1 tablet (40 mg total) by mouth daily at 6 PM.  . traZODone (DESYREL) 100 MG tablet Take 200 mg by mouth at bedtime.  . [DISCONTINUED] fluconazole (DIFLUCAN) 150 MG tablet TAKE ONE TABLET BY MOUTH ONCE  DAILY    Review of Systems  Constitutional: Negative for fever, chills, appetite change and fatigue.  Respiratory: Negative for chest tightness and shortness of breath.   Cardiovascular: Negative for chest pain and palpitations.  Gastrointestinal: Positive for diarrhea. Negative for nausea, vomiting and abdominal pain.  Endocrine: Positive for polydipsia.  Genitourinary: Positive for dysuria and frequency.  Musculoskeletal: Positive for back pain.  Neurological: Negative for dizziness and weakness.    Social History  Substance Use Topics  . Smoking status: Former Smoker    Types: Cigarettes    Quit date: 08/31/1994  . Smokeless tobacco: Never Used     Comment: quit 28 years ago  . Alcohol Use: No   Objective:   BP 138/70 mmHg  Pulse 94  Temp(Src) 98.8 F (37.1 C) (Oral)  Resp 16  Wt 193 lb (87.544 kg)  SpO2 98%  LMP  (Approximate)  Physical Exam   General Appearance:    Alert, cooperative, no distress  Eyes:    PERRL, conjunctiva/corneas clear, EOM's intact       Lungs:     Clear to auscultation bilaterally, respirations unlabored  Heart:    Regular rate and rhythm  Neurologic:   Awake, alert, oriented x 3. No apparent focal neurological           defect.         Results for orders placed or performed in visit on 08/14/15  POCT urinalysis dipstick  Result Value Ref Range   Color, UA yelow    Clarity, UA clear    Glucose, UA 2000    Bilirubin, UA negative    Ketones, UA negative    Spec Grav, UA <=1.005    Blood, UA negative    pH, UA 6.0    Protein, UA negative    Urobilinogen, UA 0.2    Nitrite, UA negative    Leukocytes, UA Negative Negative  POCT HgB A1C  Result Value Ref Range   Hemoglobin A1C 7.3    Est. average glucose Bld gHb Est-mCnc 163         Assessment & Plan:     1. Type 2 diabetes mellitus with complication, without long-term current use of insulin (HCC) A1c is up a bit, but she has not been checking sugars. Counseled to check  fasting sugars every day. Call if sugars consistently over 200. Her renal functions have been much better later and we may be able to go back up on metformin - POCT HgB A1C  2. Dysuria States it feels like she has yeast infection.  - POCT urinalysis dipstick - fluconazole (DIFLUCAN) 150 MG tablet; Take 1 tablet (150 mg total) by mouth once.  Dispense: 1 tablet; Refill: 0   3. Acute cystitis without hematuria Treated with augmentin by Dr. Holley Raring. Recheck culture.  - Urine culture  4. Insomnia Requests  to go back on lunesta which she states worked well for her in the past.  - eszopiclone (LUNESTA) 2 MG TABS tablet; Take 1 tablet (2 mg total) by mouth at bedtime as needed for sleep. Take immediately before bedtime  Dispense: 30 tablet; Refill: 0  Return in about 3 months (around 11/14/2015).       [name], MD  [facility]

## 2015-08-17 LAB — URINE CULTURE

## 2015-08-18 ENCOUNTER — Other Ambulatory Visit: Payer: Self-pay | Admitting: Family Medicine

## 2015-08-18 MED ORDER — SULFAMETHOXAZOLE-TRIMETHOPRIM 800-160 MG PO TABS
1.0000 | ORAL_TABLET | Freq: Two times a day (BID) | ORAL | Status: AC
Start: 2015-08-18 — End: 2015-08-25

## 2015-08-19 ENCOUNTER — Telehealth: Payer: Self-pay

## 2015-08-19 ENCOUNTER — Other Ambulatory Visit: Payer: Self-pay | Admitting: Family Medicine

## 2015-08-19 DIAGNOSIS — E118 Type 2 diabetes mellitus with unspecified complications: Secondary | ICD-10-CM

## 2015-08-19 MED ORDER — EMPAGLIFLOZIN 25 MG PO TABS: 25.0000 mg | ORAL_TABLET | Freq: Every day | ORAL | Status: DC

## 2015-08-19 NOTE — Telephone Encounter (Signed)
Pt requested to speak with [name] regarding her last appointment.    Thanks,   -Mickel Baas

## 2015-08-19 NOTE — Telephone Encounter (Signed)
Returned call to pt. Patient stated that her blood sugars readings have been elevated for the last several days. Blood sugar readings as follows: Thursday 7/13- 186 am & 162 pm Sunday 7/16- 350 am, & 500 pm Monday 7/17- 408 12:00 am, 294 3:00 am, 216 8:00 am and 280 10:00 am.  Patient wanted advise on what she should do about elevated sugars? Patient stated that if she has to go back on insulin, she would like to samples. Patient is also requesting a new meter sent to [facility]. Please advise?

## 2015-08-19 NOTE — Telephone Encounter (Signed)
Recommend she change from Invokana to Arthur 25mg  a day. #30, rf x 3.   Ok to call in glucometer to [facility]

## 2015-08-19 NOTE — Telephone Encounter (Signed)
Pt advised; rx sent to [facility] she is going to call with the name of the meter that her insurance will cover.   Thanks,   -Mickel Baas

## 2015-08-20 ENCOUNTER — Telehealth: Payer: Self-pay | Admitting: Family Medicine

## 2015-08-20 DIAGNOSIS — Z794 Long term (current) use of insulin: Principal | ICD-10-CM

## 2015-08-20 DIAGNOSIS — E1140 Type 2 diabetes mellitus with diabetic neuropathy, unspecified: Secondary | ICD-10-CM

## 2015-08-20 NOTE — Telephone Encounter (Signed)
Pt is asking if she still needs to take the Rx metFORMIN at night since she was given a new Rx yesterday.    Pt states she is getting a free meter through [facility] so they are going to be faxing a request for Dr Caryn Section to sign and fax back/MW

## 2015-08-21 NOTE — Telephone Encounter (Signed)
She should continue taking metformin 1000mg  a day. The new medication, Jardiance, is to replace the Invokana (because it is easier on the kidneys)

## 2015-08-21 NOTE — Telephone Encounter (Signed)
Patient was notified.

## 2015-08-23 ENCOUNTER — Telehealth: Payer: Self-pay | Admitting: Family Medicine

## 2015-08-23 NOTE — Telephone Encounter (Signed)
Pt states she is suppose to call back to give her sugar readings:  CB#[phone] or [phone]/MW  08/15/15  421 morning  179 lunch  185 2 hours after dinner  7/14//17 169 morning  200 lunch  220 at bedtime  08/17/15 243 morning  170 lunch  155 dinner  400 bedtime  08/18/15  314 morning  310 lunch  315 bedtime  08/19/15  500 morning  272 lunch  186 dinner  162 bedtime  08/20/15  350 morning  408 lunch  294 bedtime  08/21/15  216 morning  495 lunch  510 bedtime  08/22/15  160 morning  198 lunch  216 dinner  343 bedtime  08/23/15  167 morning   225 lunch

## 2015-09-06 NOTE — Telephone Encounter (Signed)
error 

## 2015-09-16 ENCOUNTER — Telehealth: Payer: Self-pay | Admitting: Family Medicine

## 2015-09-16 DIAGNOSIS — M25561 Pain in right knee: Secondary | ICD-10-CM

## 2015-09-16 NOTE — Telephone Encounter (Signed)
Pt called wanting to speak to [place].  She stated it was personal.   PCB is [phone]  Thanks, Con Memos

## 2015-09-16 NOTE — Telephone Encounter (Signed)
Patient stated that she fell while she was on vacation 2 weeks ago. She fell on her right knee. Patient stated that right knee is swollen and throbbing. Pt wants to know if she can have an x-ray of knee done without coming in for ov? Pt stated that she can not afford an ov at this time. Please advise?

## 2015-09-17 NOTE — Telephone Encounter (Signed)
Order for knee XR has been sent to [facility].

## 2015-09-17 NOTE — Telephone Encounter (Signed)
Patient was notified.

## 2015-09-18 ENCOUNTER — Ambulatory Visit
Admission: RE | Admit: 2015-09-18 | Discharge: 2015-09-18 | Disposition: A | Discharge status: Home / Self Care | Payer: BC Managed Care – PPO | Source: Ambulatory Visit | Attending: Family Medicine | Admitting: Family Medicine

## 2015-09-18 DIAGNOSIS — I739 Peripheral vascular disease, unspecified: Secondary | ICD-10-CM | POA: Diagnosis not present

## 2015-09-18 DIAGNOSIS — M25561 Pain in right knee: Secondary | ICD-10-CM | POA: Insufficient documentation

## 2015-09-18 IMAGING — CT CT CHEST W/O CM
2 of 4 series · 15 of 36 positions shown, 18 images · non-contrast
Comparison: Chest radiograph 05/19/2013

CLINICAL DATA: History of recent endoscopy demonstrating mass
effect on esophagus presumably from aorta, difficulty swallowing for
1 month, no IV contrast due to elevated renal function tests

EXAM:
CT CHEST WITHOUT CONTRAST
TECHNIQUE: Multidetector CT imaging of the chest was performed following the
standard protocol without IV contrast..

[Series 2: routine chest wo · axial · 0.72mm/px · z∈[-280,-45]mm · 12 of 57 slices shown, 15 images]
[im 5/57  mediastinal]
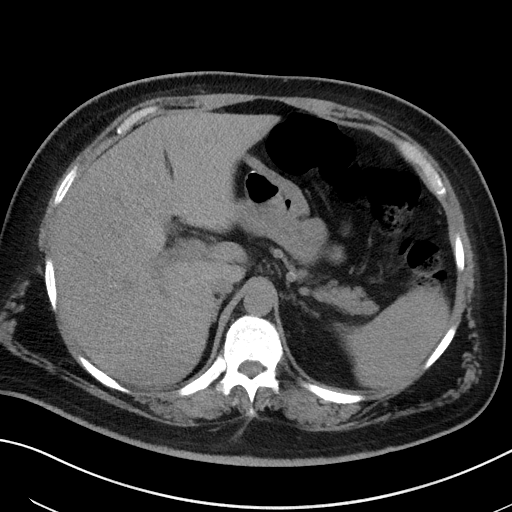
[im 5/57  lung]
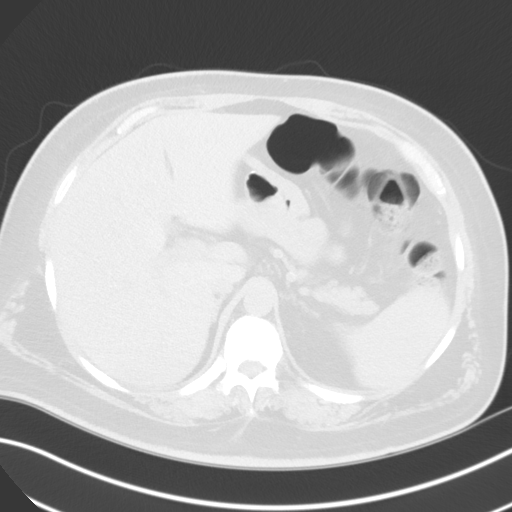
[im 9/57  lung]
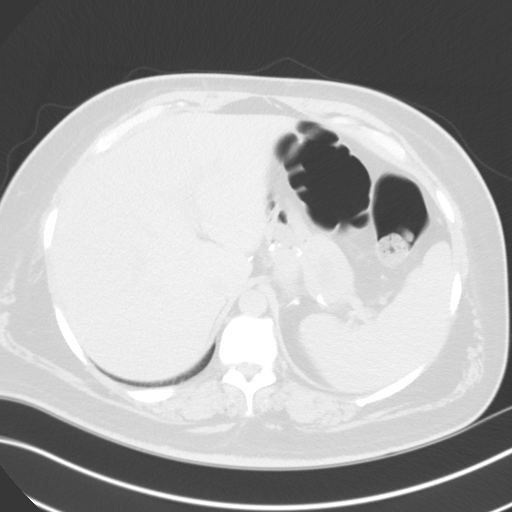
[im 13/57  lung]
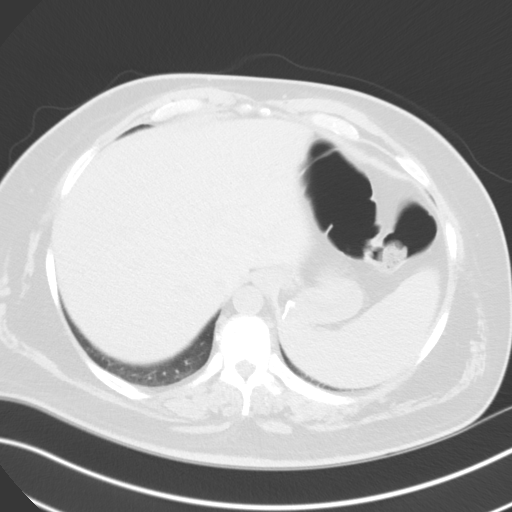
[im 18/57  lung]
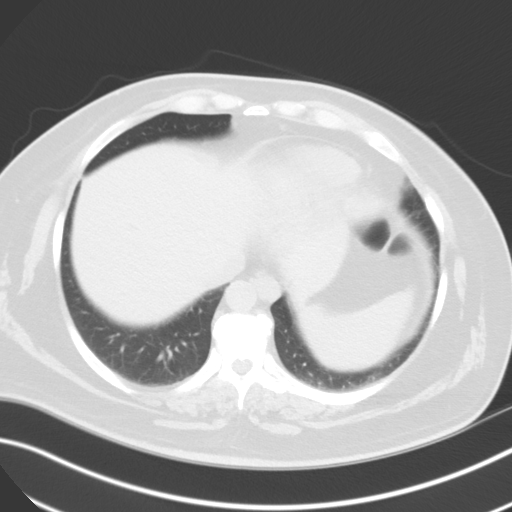
[im 22/57  mediastinal]
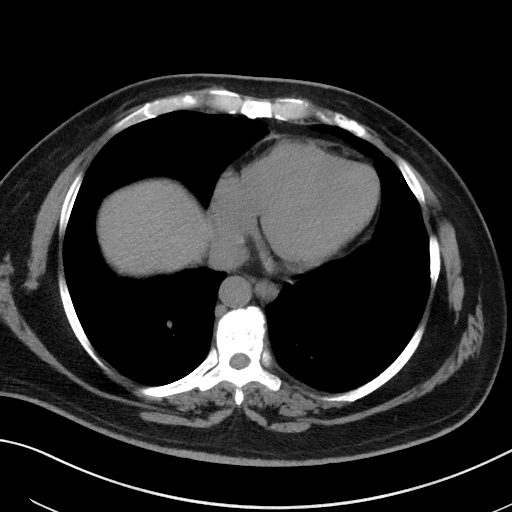
[im 22/57  lung]
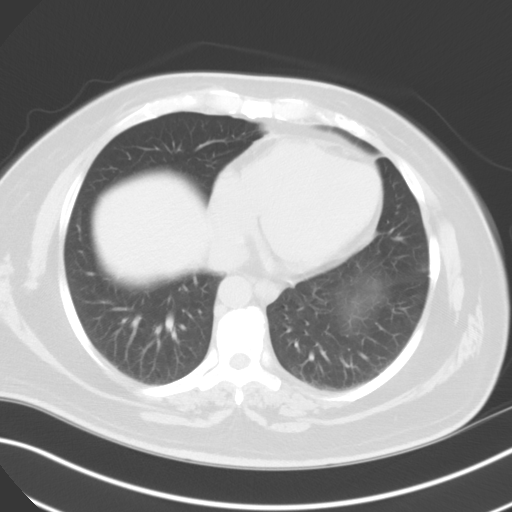
[im 26/57  lung]
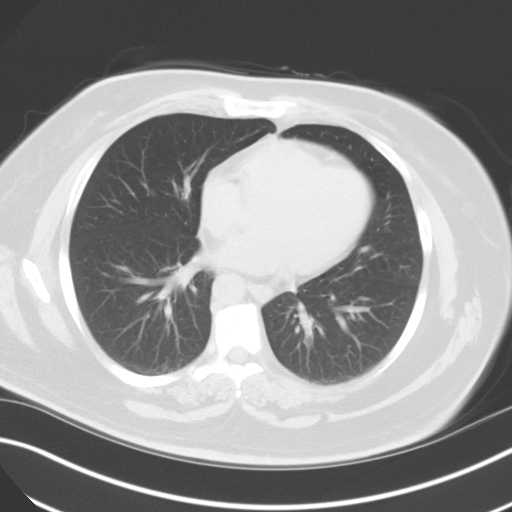
[im 31/57  lung]
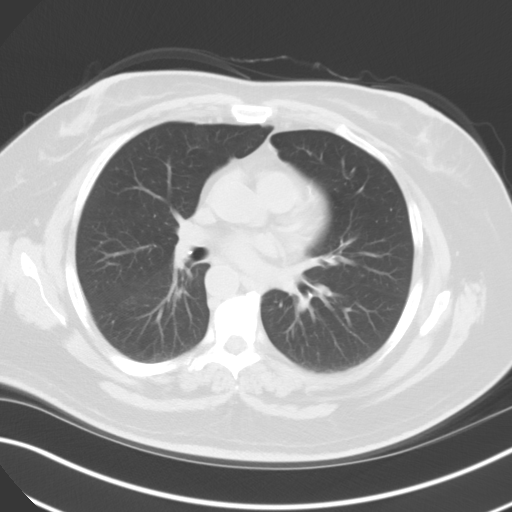
[im 35/57  lung]
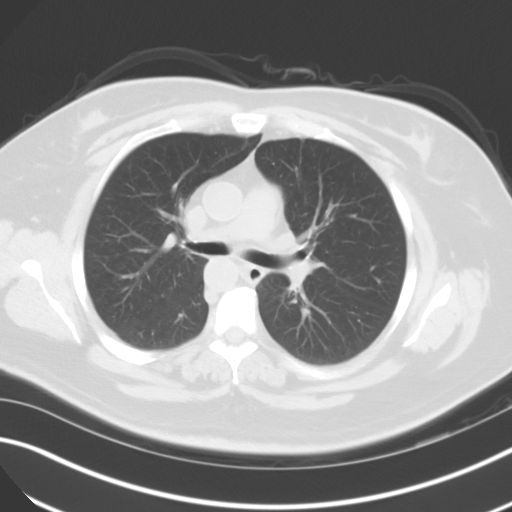
[im 39/57  mediastinal]
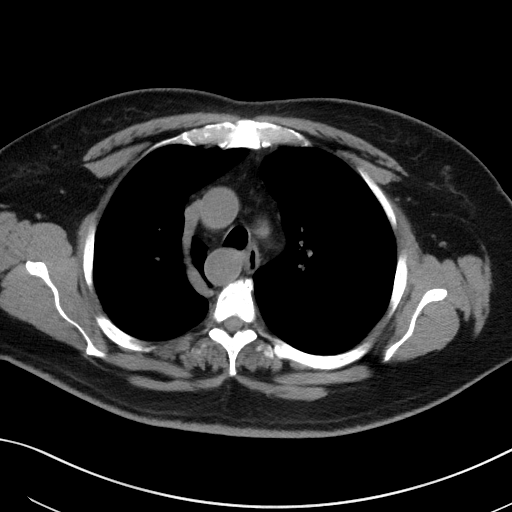
[im 39/57  lung]
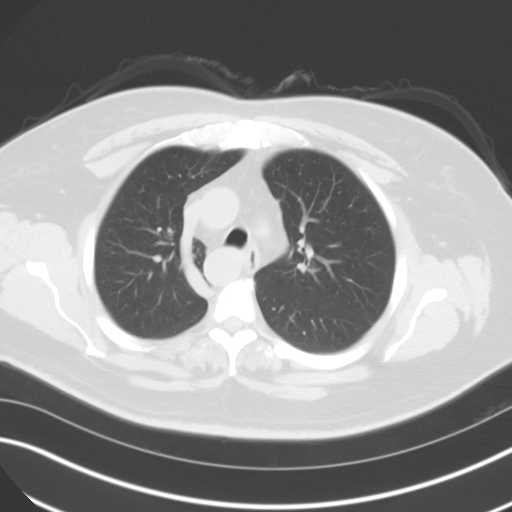
[im 44/57  lung]
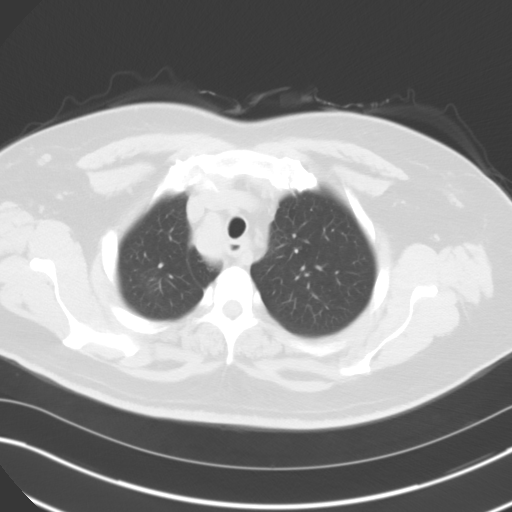
[im 48/57  lung]
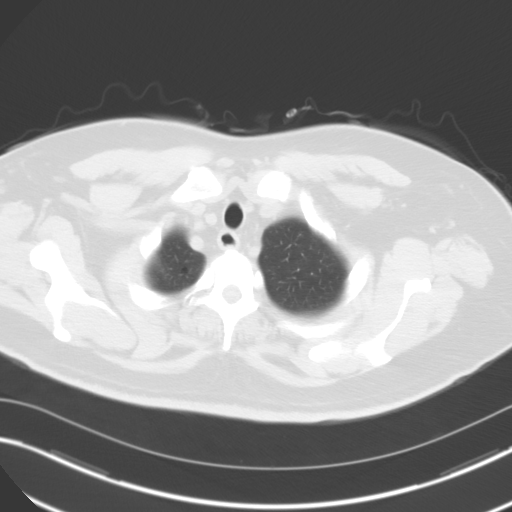
[im 52/57  lung]
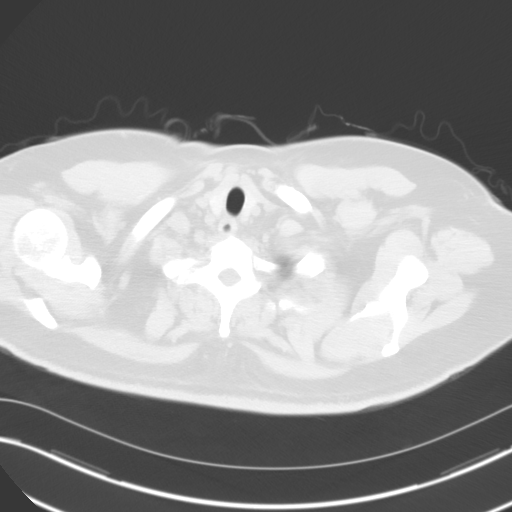

[Series 5: cor routine chest wo · coronal · 0.59mm/px · 3 of 134 slices shown]
[im 27/134  lung]
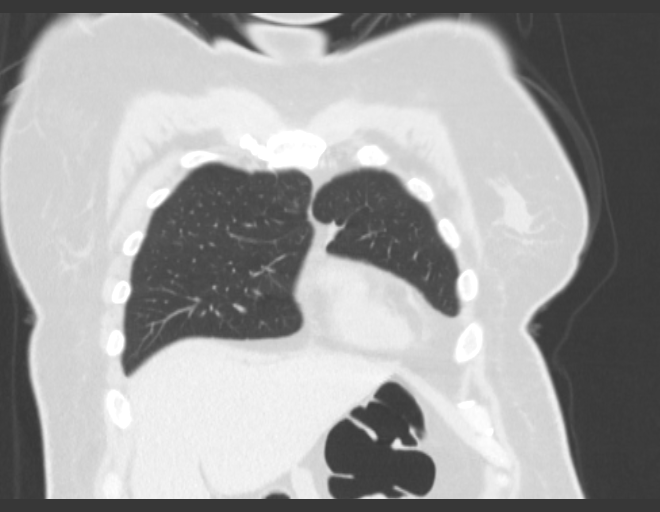
[im 54/134  lung]
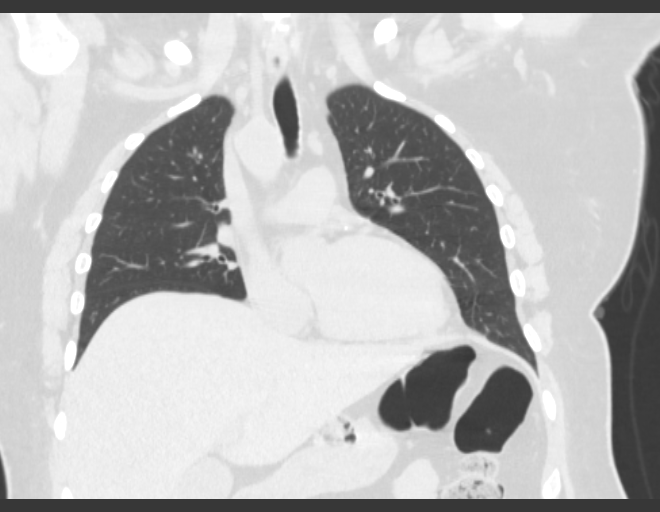
[im 80/134  lung]
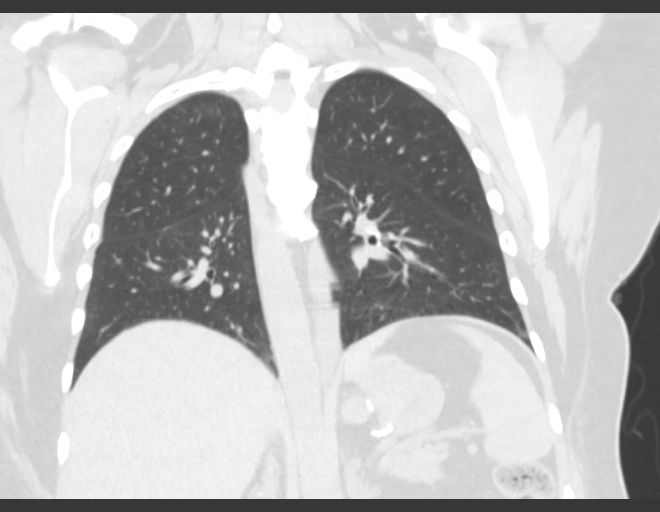

[15 of 36 positions shown; findings below may reference images not displayed]

FINDINGS: The lungs are clear. Thyroid is normal. No significant hilar or
mediastinal adenopathy. No pleural or pericardial effusion. Images
through the upper abdomen demonstrate surgical change in the upper
stomach suggesting possibility of prior gastric bypass.

There is a right-sided aortic arch. There is anomalous origin of
left subclavian artery off all of the proximal descending thoracic
aorta posteriorly, with subclavian artery passing posterior to the
esophagus. At this level there is mild focal esophageal dilatation.

There is also incidental note of a right azygos lobe. There are no
acute musculoskeletal abnormalities.
IMPRESSION: Anatomic variant consisting of right aortic arch and anomalous
origin of left subclavian artery. Due to the passage of the left
subclavian artery posterior to the esophagus, there is mild focal
esophageal dilatation, suggesting mild focal esophageal obstruction.

## 2015-09-20 ENCOUNTER — Other Ambulatory Visit: Payer: Self-pay | Admitting: *Deleted

## 2015-09-20 ENCOUNTER — Inpatient Hospital Stay: Payer: BC Managed Care – PPO | Attending: Oncology

## 2015-09-20 DIAGNOSIS — I129 Hypertensive chronic kidney disease with stage 1 through stage 4 chronic kidney disease, or unspecified chronic kidney disease: Secondary | ICD-10-CM | POA: Insufficient documentation

## 2015-09-20 DIAGNOSIS — I712 Thoracic aortic aneurysm, without rupture: Secondary | ICD-10-CM | POA: Insufficient documentation

## 2015-09-20 DIAGNOSIS — G35 Multiple sclerosis: Secondary | ICD-10-CM | POA: Insufficient documentation

## 2015-09-20 DIAGNOSIS — I5022 Chronic systolic (congestive) heart failure: Secondary | ICD-10-CM | POA: Insufficient documentation

## 2015-09-20 DIAGNOSIS — Z87891 Personal history of nicotine dependence: Secondary | ICD-10-CM | POA: Insufficient documentation

## 2015-09-20 DIAGNOSIS — N184 Chronic kidney disease, stage 4 (severe): Secondary | ICD-10-CM | POA: Insufficient documentation

## 2015-09-20 DIAGNOSIS — E039 Hypothyroidism, unspecified: Secondary | ICD-10-CM | POA: Diagnosis not present

## 2015-09-20 DIAGNOSIS — Z8673 Personal history of transient ischemic attack (TIA), and cerebral infarction without residual deficits: Secondary | ICD-10-CM | POA: Diagnosis not present

## 2015-09-20 DIAGNOSIS — Z85828 Personal history of other malignant neoplasm of skin: Secondary | ICD-10-CM | POA: Diagnosis not present

## 2015-09-20 DIAGNOSIS — I2510 Atherosclerotic heart disease of native coronary artery without angina pectoris: Secondary | ICD-10-CM | POA: Diagnosis not present

## 2015-09-20 DIAGNOSIS — D509 Iron deficiency anemia, unspecified: Secondary | ICD-10-CM | POA: Insufficient documentation

## 2015-09-20 DIAGNOSIS — F419 Anxiety disorder, unspecified: Secondary | ICD-10-CM | POA: Diagnosis not present

## 2015-09-20 DIAGNOSIS — Z86010 Personal history of colonic polyps: Secondary | ICD-10-CM | POA: Diagnosis not present

## 2015-09-20 DIAGNOSIS — R531 Weakness: Secondary | ICD-10-CM | POA: Diagnosis not present

## 2015-09-20 DIAGNOSIS — I951 Orthostatic hypotension: Secondary | ICD-10-CM | POA: Insufficient documentation

## 2015-09-20 DIAGNOSIS — I429 Cardiomyopathy, unspecified: Secondary | ICD-10-CM | POA: Diagnosis not present

## 2015-09-20 DIAGNOSIS — Z8669 Personal history of other diseases of the nervous system and sense organs: Secondary | ICD-10-CM | POA: Diagnosis not present

## 2015-09-20 DIAGNOSIS — Z7982 Long term (current) use of aspirin: Secondary | ICD-10-CM | POA: Insufficient documentation

## 2015-09-20 DIAGNOSIS — I050 Rheumatic mitral stenosis: Secondary | ICD-10-CM | POA: Insufficient documentation

## 2015-09-20 DIAGNOSIS — Z7984 Long term (current) use of oral hypoglycemic drugs: Secondary | ICD-10-CM | POA: Insufficient documentation

## 2015-09-20 DIAGNOSIS — Z9884 Bariatric surgery status: Secondary | ICD-10-CM | POA: Insufficient documentation

## 2015-09-20 DIAGNOSIS — F329 Major depressive disorder, single episode, unspecified: Secondary | ICD-10-CM | POA: Insufficient documentation

## 2015-09-20 DIAGNOSIS — K219 Gastro-esophageal reflux disease without esophagitis: Secondary | ICD-10-CM | POA: Diagnosis not present

## 2015-09-20 DIAGNOSIS — Z808 Family history of malignant neoplasm of other organs or systems: Secondary | ICD-10-CM | POA: Insufficient documentation

## 2015-09-20 DIAGNOSIS — Z79899 Other long term (current) drug therapy: Secondary | ICD-10-CM | POA: Insufficient documentation

## 2015-09-20 DIAGNOSIS — E785 Hyperlipidemia, unspecified: Secondary | ICD-10-CM | POA: Insufficient documentation

## 2015-09-20 DIAGNOSIS — E119 Type 2 diabetes mellitus without complications: Secondary | ICD-10-CM | POA: Insufficient documentation

## 2015-09-20 DIAGNOSIS — Z800 Family history of malignant neoplasm of digestive organs: Secondary | ICD-10-CM | POA: Insufficient documentation

## 2015-09-20 DIAGNOSIS — R5383 Other fatigue: Secondary | ICD-10-CM | POA: Insufficient documentation

## 2015-09-20 DIAGNOSIS — Z8711 Personal history of peptic ulcer disease: Secondary | ICD-10-CM | POA: Insufficient documentation

## 2015-09-20 LAB — CBC WITH DIFFERENTIAL/PLATELET
BASOS ABS: 0.1 10*3/uL (ref 0–0.1)
BASOS PCT: 1 %
EOS ABS: 0.2 10*3/uL (ref 0–0.7)
EOS PCT: 2 %
HCT: 42 % (ref 35.0–47.0)
Hemoglobin: 13.8 g/dL (ref 12.0–16.0)
LYMPHS PCT: 19 %
Lymphs Abs: 2 10*3/uL (ref 1.0–3.6)
MCH: 26.4 pg (ref 26.0–34.0)
MCHC: 32.8 g/dL (ref 32.0–36.0)
MCV: 80.5 fL (ref 80.0–100.0)
MONO ABS: 0.5 10*3/uL (ref 0.2–0.9)
Monocytes Relative: 5 %
Neutro Abs: 7.7 10*3/uL — ABNORMAL HIGH (ref 1.4–6.5)
Neutrophils Relative %: 73 %
PLATELETS: 217 10*3/uL (ref 150–440)
RBC: 5.22 MIL/uL — ABNORMAL HIGH (ref 3.80–5.20)
RDW: 15 % — AB (ref 11.5–14.5)
WBC: 10.5 10*3/uL (ref 3.6–11.0)

## 2015-09-20 LAB — IRON AND TIBC
IRON: 36 ug/dL (ref 28–170)
Saturation Ratios: 14 % (ref 10.4–31.8)
TIBC: 258 ug/dL (ref 250–450)
UIBC: 222 ug/dL

## 2015-09-20 LAB — FERRITIN: FERRITIN: 57 ng/mL (ref 11–307)

## 2015-09-23 ENCOUNTER — Encounter: Payer: Self-pay | Admitting: Cardiovascular Disease

## 2015-09-25 NOTE — Progress Notes (Signed)
[facility]  Telephone:([phone]) [phone] Fax:(336) [phone]  ID: [name] OB: 14-Jan-1971  MR#: [id number]  [id number]  Patient Care Team: [name], MD as PCP - General (Family Medicine) Dagoberto Ligas, MD as Consulting Physician (Nephrology) Minna Merritts, MD as Consulting Physician (Cardiology) Vladimir Crofts, MD as Consulting Physician (Neurology) Irene Shipper, MD as Consulting Physician (Gastroenterology)  CHIEF COMPLAINT: Iron deficiency anemia.  INTERVAL HISTORY: Patient is here today for labs and further evaluation and consideration of IV feraheme. She continues to have chronic weakness and fatigue, but otherwise well. She has no neurologic complaints. She denies any recent fevers or illnesses. She has a good appetite and denies weight loss. She denies any chest pain or shortness of breath. She denies any nausea, vomiting, constipation, or diarrhea. She has no melena or hematochezia. She has no urinary complaints. Patient otherwise feels well and offers no further specific complaints.  REVIEW OF SYSTEMS:   Review of Systems  Constitutional: Positive for malaise/fatigue. Negative for fever and weight loss.  Respiratory: Negative.  Negative for cough and shortness of breath.   Cardiovascular: Negative.  Negative for chest pain and palpitations.  Gastrointestinal: Negative.  Negative for blood in stool and melena.  Genitourinary: Negative.   Musculoskeletal: Negative.   Neurological: Positive for weakness.  Psychiatric/Behavioral: Negative.     As per HPI. Otherwise, a complete review of systems is negatve.  PAST MEDICAL HISTORY: Past Medical History:  Diagnosis Date  . Anxiety   . 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 CAD (coronary artery disease)    a. cath 11/2014: LM: nl, mLAD 85-90% s/p PCI/DES (Xience 3.25 x 23), mLCX 30%, RCA nl;; b. cath 04/2015: 70% mD1 @ bifurcation. Patent LAD stent - > Med Rx.; c. Cath-PCI 5/'17: Patent LAD stent, D1  now ~80% - PCI Resolute DES 2.25 x 12.    . Chronic systolic CHF (congestive heart failure) ([place])    a. echo 01/29/2013 EF 40-45%, basal anteroseptal & mid anteroseptal segments abnl; b echo 05/2012 EF 50-55%, mildly dilated LA, nl RVSP  . CKD (chronic kidney disease), stage IV ([place])   . Colon polyp   . CVA (cerebral vascular accident) ([place])    a. 06/2008-TEE nl LV fxn; b. 01/2013 - notes indicate short run of SVT at that time  . Depression   . Diabetes mellitus without complication ([place])   . Gastric ulcer 04/27/2011  . GERD (gastroesophageal reflux disease)   . Headache    Migrains  . Hyperlipidemia   . Hypertension   . Hypothyroidism   . Ischemic cardiomyopathy   . Malignant melanoma of skin of scalp ([place])   . MS (mitral stenosis)   . Multiple sclerosis ([place]) 2015  . Orthostatic hypotension   . Syncope     PAST SURGICAL HISTORY: Past Surgical History:  Procedure Laterality Date  . APPENDECTOMY    . CARDIAC CATHETERIZATION N/A 11/09/2014   Procedure: Coronary Angiography;  Surgeon: Minna Merritts, MD;  Location: [facility];  Service: Cardiovascular;  Laterality: N/A;  . CARDIAC CATHETERIZATION N/A 11/12/2014   Procedure: Coronary Stent Intervention;  Surgeon: Isaias Cowman, MD;  Location: [facility];  Service: Cardiovascular;  Laterality: N/A;  . CARDIAC CATHETERIZATION N/A 04/18/2015   Procedure: Left Heart Cath and Coronary Angiography;  Surgeon: Minna Merritts, MD;  Location: [facility];  Service: Cardiovascular;  Laterality: N/A;  . CARDIAC CATHETERIZATION Left 06/04/2015   Procedure: Left Heart Cath  and Coronary Angiography;  Surgeon: Wellington Hampshire, MD;  Location: [place] [facility];  Service: Cardiovascular;  Laterality: Left;  . CARDIAC CATHETERIZATION N/A 06/04/2015   Procedure: Coronary Stent Intervention;  Surgeon: Wellington Hampshire, MD;  Location: [facility];  Service: Cardiovascular;  Laterality: N/A;  . DILATION AND  CURETTAGE OF UTERUS    . ESOPHAGOGASTRODUODENOSCOPY (EGD) WITH PROPOFOL N/A 09/14/2014   Procedure: ESOPHAGOGASTRODUODENOSCOPY (EGD) WITH PROPOFOL;  Surgeon: Josefine Class, MD;  Location: [facility];  Service: Endoscopy;  Laterality: N/A;  . GASTRIC BYPASS  09/2009   [facility] [place]  2016   Dr. Evorn Gong  . RIGHT OOPHORECTOMY    . TRIGGER FINGER RELEASE    . TUBAL LIGATION     R tube removed still w/ Left    FAMILY HISTORY Family History  Problem Relation Age of Onset  . Hypertension Mother   . Anxiety disorder Mother   . Depression Mother   . Bipolar disorder Mother   . Heart disease Mother   . Hyperlipidemia Mother   . Kidney disease Father   . Heart disease Father   . Hypertension Father   . Diabetes Father   . Stroke Father   . Colon cancer Father     dx in his 53's  . Anxiety disorder Father   . Depression Father   . Skin cancer Father   . Kidney disease Sister   . Thyroid nodules Sister   . Hypertension Sister   . Hypertension Sister   . Diabetes Sister   . Hyperlipidemia Sister   . Depression Sister        ADVANCED DIRECTIVES:    HEALTH MAINTENANCE: Social History  Substance Use Topics  . Smoking status: Former Smoker    Types: Cigarettes    Quit date: 08/31/1994  . Smokeless tobacco: Never Used     Comment: quit 28 years ago  . Alcohol use No     No Known Allergies  Current Outpatient Prescriptions  Medication Sig Dispense Refill  . aspirin EC 81 MG tablet Take 1 tablet (81 mg total) by mouth daily.    . baclofen (LIORESAL) 10 MG tablet Take 1 tablet (10 mg total) by mouth 2 (two) times daily as needed for muscle spasms.  0  . diltiazem (CARDIZEM SR) 120 MG 12 hr capsule Take 120 mg by mouth daily.    . ergocalciferol (VITAMIN D2) 50000 units capsule Take 50,000 Units by mouth once a week.    . eszopiclone (LUNESTA) 2 MG TABS tablet Take 1 tablet (2 mg total) by mouth at bedtime as needed for sleep. Take  immediately before bedtime 30 tablet 0  . isosorbide mononitrate (IMDUR) 60 MG 24 hr tablet Take 60 mg by mouth daily.    Marland Kitchen levothyroxine (SYNTHROID, LEVOTHROID) 25 MCG tablet Take 1 tablet (25 mcg total) by mouth daily before breakfast. 30 tablet 5  . losartan (COZAAR) 25 MG tablet Take 25 mg by mouth daily.    . metFORMIN (GLUCOPHAGE) 1000 MG tablet Take 1,000 mg by mouth at bedtime.    . Multiple Vitamin (MULTIVITAMIN WITH MINERALS) TABS tablet Take 1 tablet by mouth daily.    . nitroGLYCERIN (NITROSTAT) 0.4 MG SL tablet Place 1 tablet (0.4 mg total) under the tongue every 5 (five) minutes as needed for chest pain. 25 tablet 6  . pantoprazole (PROTONIX) 40 MG tablet Take 1 tablet (40 mg total) by mouth 2 (two) times daily.  60 tablet 5  . pregabalin (LYRICA) 100 MG capsule Take 100 mg by mouth 2 (two) times daily.    . promethazine (PHENERGAN) 25 MG tablet Take 25 mg by mouth every 6 (six) hours as needed for nausea or vomiting. Reported on 05/31/2015    . rosuvastatin (CRESTOR) 40 MG tablet Take 1 tablet (40 mg total) by mouth daily at 6 PM. 90 tablet 3  . traZODone (DESYREL) 100 MG tablet Take 200 mg by mouth at bedtime.     No current facility-administered medications for this visit.     OBJECTIVE: Vitals:   09/27/15 1011  BP: 131/89  Pulse: 69  Resp: 18  Temp: 98.1 F (36.7 C)     Body mass index is 31.79 kg/m.    ECOG FS:0 - Asymptomatic  General: Well-developed, well-nourished, no acute distress. Eyes: Pink conjunctiva, anicteric sclera. HEENT: Normocephalic, moist mucous membranes, clear oropharnyx. Lungs: Clear to auscultation bilaterally. Heart: Regular rate and rhythm. No rubs, murmurs, or gallops. Abdomen: Soft, nontender, nondistended. No organomegaly noted, normoactive bowel sounds. Musculoskeletal: No edema, cyanosis, or clubbing. Neuro: Alert, answering all questions appropriately. Cranial nerves grossly intact. Skin: No rashes or petechiae noted. Psych: Normal  affect.  LAB RESULTS:  Lab Results  Component Value Date   NA 141 06/05/2015   K 4.5 06/05/2015   CL 108 06/05/2015   CO2 26 06/05/2015   GLUCOSE 109 (H) 06/05/2015   BUN 28 (H) 06/05/2015   CREATININE 1.63 (H) 06/05/2015   CALCIUM 8.8 (L) 06/05/2015   PROT 7.1 06/03/2015   ALBUMIN 3.9 06/03/2015   AST 23 06/03/2015   ALT 24 06/03/2015   ALKPHOS 90 06/03/2015   BILITOT 0.5 06/03/2015   GFRNONAA 37 (L) 06/05/2015   GFRAA 43 (L) 06/05/2015    Lab Results  Component Value Date   WBC 10.5 09/20/2015   NEUTROABS 7.7 (H) 09/20/2015   HGB 13.8 09/20/2015   HCT 42.0 09/20/2015   MCV 80.5 09/20/2015   PLT 217 09/20/2015   Lab Results  Component Value Date   IRON 36 09/20/2015   TIBC 258 09/20/2015   IRONPCTSAT 14 09/20/2015    Lab Results  Component Value Date   FERRITIN 57 09/20/2015     STUDIES: Dg Knee Complete 4 Views Right  Result Date: 09/18/2015 CLINICAL DATA:  Fall.  Initial evaluation EXAM: RIGHT KNEE - COMPLETE 4+ VIEW COMPARISON:  06/03/2014 . FINDINGS: Mild degenerative change. No acute bony or joint abnormality. Mild medial compartment degenerative change . Peripheral vascular calcification. IMPRESSION: 1.  No acute or focal bony abnormality. 2. Peripheral vascular disease. Electronically Signed   By: Marcello Moores  Register   On: 09/18/2015 09:27    ASSESSMENT: Iron deficiency anemia.  PLAN:    1. Iron deficiency anemia: Possibly secondary to poor absorption given patient's history of gastric bypass surgery. Patient's hemoglobin and iron stores continue to be within normal limits. Previous, her entire anemia workup was also either negative or within normal limits. Patient does take oral iron.  No IV feraheme needed today. Patient last received IV iron in March 2017. Patient will return to clinic in 3 months with repeat laboratory work and further evaluation.    Patient expressed understanding and was in agreement with this plan. She also understands that She  can call clinic at any time with any questions, concerns, or complaints.   Lloyd Huger, MD   09/27/2015 1:19 PM

## 2015-09-27 ENCOUNTER — Inpatient Hospital Stay (HOSPITAL_BASED_OUTPATIENT_CLINIC_OR_DEPARTMENT_OTHER): Payer: BC Managed Care – PPO | Admitting: Oncology

## 2015-09-27 VITALS — BP 131/89 | HR 69 | Temp 98.1°F | Resp 18 | Wt 179.5 lb

## 2015-09-27 DIAGNOSIS — I2510 Atherosclerotic heart disease of native coronary artery without angina pectoris: Secondary | ICD-10-CM

## 2015-09-27 DIAGNOSIS — I050 Rheumatic mitral stenosis: Secondary | ICD-10-CM

## 2015-09-27 DIAGNOSIS — D509 Iron deficiency anemia, unspecified: Secondary | ICD-10-CM

## 2015-09-27 DIAGNOSIS — I429 Cardiomyopathy, unspecified: Secondary | ICD-10-CM

## 2015-09-27 DIAGNOSIS — G35 Multiple sclerosis: Secondary | ICD-10-CM

## 2015-09-27 DIAGNOSIS — I129 Hypertensive chronic kidney disease with stage 1 through stage 4 chronic kidney disease, or unspecified chronic kidney disease: Secondary | ICD-10-CM

## 2015-09-27 DIAGNOSIS — Z800 Family history of malignant neoplasm of digestive organs: Secondary | ICD-10-CM

## 2015-09-27 DIAGNOSIS — F419 Anxiety disorder, unspecified: Secondary | ICD-10-CM

## 2015-09-27 DIAGNOSIS — F329 Major depressive disorder, single episode, unspecified: Secondary | ICD-10-CM

## 2015-09-27 DIAGNOSIS — Z7984 Long term (current) use of oral hypoglycemic drugs: Secondary | ICD-10-CM

## 2015-09-27 DIAGNOSIS — E039 Hypothyroidism, unspecified: Secondary | ICD-10-CM

## 2015-09-27 DIAGNOSIS — N184 Chronic kidney disease, stage 4 (severe): Secondary | ICD-10-CM

## 2015-09-27 DIAGNOSIS — R5383 Other fatigue: Secondary | ICD-10-CM

## 2015-09-27 DIAGNOSIS — Z87891 Personal history of nicotine dependence: Secondary | ICD-10-CM

## 2015-09-27 DIAGNOSIS — K219 Gastro-esophageal reflux disease without esophagitis: Secondary | ICD-10-CM

## 2015-09-27 DIAGNOSIS — Z85828 Personal history of other malignant neoplasm of skin: Secondary | ICD-10-CM

## 2015-09-27 DIAGNOSIS — R531 Weakness: Secondary | ICD-10-CM | POA: Diagnosis not present

## 2015-09-27 DIAGNOSIS — I951 Orthostatic hypotension: Secondary | ICD-10-CM

## 2015-09-27 DIAGNOSIS — Z808 Family history of malignant neoplasm of other organs or systems: Secondary | ICD-10-CM

## 2015-09-27 DIAGNOSIS — I5022 Chronic systolic (congestive) heart failure: Secondary | ICD-10-CM

## 2015-09-27 DIAGNOSIS — E785 Hyperlipidemia, unspecified: Secondary | ICD-10-CM

## 2015-09-27 DIAGNOSIS — Z9884 Bariatric surgery status: Secondary | ICD-10-CM

## 2015-09-27 DIAGNOSIS — Z8673 Personal history of transient ischemic attack (TIA), and cerebral infarction without residual deficits: Secondary | ICD-10-CM

## 2015-09-27 DIAGNOSIS — Z79899 Other long term (current) drug therapy: Secondary | ICD-10-CM

## 2015-09-27 DIAGNOSIS — Z7982 Long term (current) use of aspirin: Secondary | ICD-10-CM

## 2015-09-27 DIAGNOSIS — I712 Thoracic aortic aneurysm, without rupture: Secondary | ICD-10-CM

## 2015-09-27 DIAGNOSIS — E119 Type 2 diabetes mellitus without complications: Secondary | ICD-10-CM

## 2015-09-27 DIAGNOSIS — Z8669 Personal history of other diseases of the nervous system and sense organs: Secondary | ICD-10-CM

## 2015-09-27 DIAGNOSIS — Z86010 Personal history of colonic polyps: Secondary | ICD-10-CM

## 2015-09-27 DIAGNOSIS — Z8711 Personal history of peptic ulcer disease: Secondary | ICD-10-CM
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
## 2015-10-28 ENCOUNTER — Emergency Department: Payer: BC Managed Care – PPO

## 2015-10-28 ENCOUNTER — Encounter: Payer: Self-pay | Admitting: Cardiovascular Disease

## 2015-10-28 ENCOUNTER — Inpatient Hospital Stay
Admission: EM | Admit: 2015-10-28 | Discharge: 2015-11-02 | DRG: 872 | Disposition: A | Discharge status: Home / Self Care | Payer: BC Managed Care – PPO | Attending: Internal Medicine | Admitting: Internal Medicine

## 2015-10-28 ENCOUNTER — Encounter: Payer: Self-pay | Admitting: Emergency Medicine

## 2015-10-28 ENCOUNTER — Emergency Department (HOSPITAL_BASED_OUTPATIENT_CLINIC_OR_DEPARTMENT_OTHER)
Admit: 2015-10-28 | Discharge: 2015-10-28 | Disposition: A | Discharge status: Home / Self Care | Payer: BC Managed Care – PPO | Attending: Emergency Medicine | Admitting: Emergency Medicine

## 2015-10-28 DIAGNOSIS — K219 Gastro-esophageal reflux disease without esophagitis: Secondary | ICD-10-CM | POA: Diagnosis present

## 2015-10-28 DIAGNOSIS — Z8711 Personal history of peptic ulcer disease: Secondary | ICD-10-CM

## 2015-10-28 DIAGNOSIS — Z87891 Personal history of nicotine dependence: Secondary | ICD-10-CM

## 2015-10-28 DIAGNOSIS — N184 Chronic kidney disease, stage 4 (severe): Secondary | ICD-10-CM | POA: Diagnosis present

## 2015-10-28 DIAGNOSIS — R109 Unspecified abdominal pain: Secondary | ICD-10-CM

## 2015-10-28 DIAGNOSIS — R6520 Severe sepsis without septic shock: Secondary | ICD-10-CM | POA: Diagnosis present

## 2015-10-28 DIAGNOSIS — I255 Ischemic cardiomyopathy: Secondary | ICD-10-CM | POA: Diagnosis present

## 2015-10-28 DIAGNOSIS — R079 Chest pain, unspecified: Secondary | ICD-10-CM

## 2015-10-28 DIAGNOSIS — I5022 Chronic systolic (congestive) heart failure: Secondary | ICD-10-CM | POA: Diagnosis present

## 2015-10-28 DIAGNOSIS — Z955 Presence of coronary angioplasty implant and graft: Secondary | ICD-10-CM

## 2015-10-28 DIAGNOSIS — E039 Hypothyroidism, unspecified: Secondary | ICD-10-CM | POA: Diagnosis present

## 2015-10-28 DIAGNOSIS — I69354 Hemiplegia and hemiparesis following cerebral infarction affecting left non-dominant side: Secondary | ICD-10-CM

## 2015-10-28 DIAGNOSIS — I2510 Atherosclerotic heart disease of native coronary artery without angina pectoris: Secondary | ICD-10-CM | POA: Diagnosis present

## 2015-10-28 DIAGNOSIS — N319 Neuromuscular dysfunction of bladder, unspecified: Secondary | ICD-10-CM | POA: Diagnosis present

## 2015-10-28 DIAGNOSIS — Z7982 Long term (current) use of aspirin: Secondary | ICD-10-CM

## 2015-10-28 DIAGNOSIS — N390 Urinary tract infection, site not specified: Secondary | ICD-10-CM | POA: Diagnosis present

## 2015-10-28 DIAGNOSIS — Z818 Family history of other mental and behavioral disorders: Secondary | ICD-10-CM

## 2015-10-28 DIAGNOSIS — Z833 Family history of diabetes mellitus: Secondary | ICD-10-CM

## 2015-10-28 DIAGNOSIS — E785 Hyperlipidemia, unspecified: Secondary | ICD-10-CM | POA: Diagnosis present

## 2015-10-28 DIAGNOSIS — R0602 Shortness of breath: Secondary | ICD-10-CM | POA: Diagnosis not present

## 2015-10-28 DIAGNOSIS — Z9889 Other specified postprocedural states: Secondary | ICD-10-CM

## 2015-10-28 DIAGNOSIS — R000 Tachycardia, unspecified: Secondary | ICD-10-CM | POA: Diagnosis present

## 2015-10-28 DIAGNOSIS — Z9884 Bariatric surgery status: Secondary | ICD-10-CM

## 2015-10-28 DIAGNOSIS — Z9049 Acquired absence of other specified parts of digestive tract: Secondary | ICD-10-CM

## 2015-10-28 DIAGNOSIS — Z823 Family history of stroke: Secondary | ICD-10-CM

## 2015-10-28 DIAGNOSIS — Z85820 Personal history of malignant melanoma of skin: Secondary | ICD-10-CM

## 2015-10-28 DIAGNOSIS — I25118 Atherosclerotic heart disease of native coronary artery with other forms of angina pectoris: Secondary | ICD-10-CM | POA: Diagnosis present

## 2015-10-28 DIAGNOSIS — R112 Nausea with vomiting, unspecified: Secondary | ICD-10-CM

## 2015-10-28 DIAGNOSIS — Z841 Family history of disorders of kidney and ureter: Secondary | ICD-10-CM

## 2015-10-28 DIAGNOSIS — Z7984 Long term (current) use of oral hypoglycemic drugs: Secondary | ICD-10-CM

## 2015-10-28 DIAGNOSIS — Z808 Family history of malignant neoplasm of other organs or systems: Secondary | ICD-10-CM

## 2015-10-28 DIAGNOSIS — Z86010 Personal history of colonic polyps: Secondary | ICD-10-CM

## 2015-10-28 DIAGNOSIS — A419 Sepsis, unspecified organism: Principal | ICD-10-CM | POA: Diagnosis present

## 2015-10-28 DIAGNOSIS — I119 Hypertensive heart disease without heart failure: Secondary | ICD-10-CM | POA: Diagnosis present

## 2015-10-28 DIAGNOSIS — N12 Tubulo-interstitial nephritis, not specified as acute or chronic: Secondary | ICD-10-CM | POA: Diagnosis present

## 2015-10-28 DIAGNOSIS — N179 Acute kidney failure, unspecified: Secondary | ICD-10-CM | POA: Diagnosis present

## 2015-10-28 DIAGNOSIS — E11319 Type 2 diabetes mellitus with unspecified diabetic retinopathy without macular edema: Secondary | ICD-10-CM | POA: Diagnosis present

## 2015-10-28 DIAGNOSIS — Z8249 Family history of ischemic heart disease and other diseases of the circulatory system: Secondary | ICD-10-CM

## 2015-10-28 DIAGNOSIS — Z9851 Tubal ligation status: Secondary | ICD-10-CM

## 2015-10-28 DIAGNOSIS — R61 Generalized hyperhidrosis: Secondary | ICD-10-CM | POA: Diagnosis present

## 2015-10-28 DIAGNOSIS — R0789 Other chest pain: Secondary | ICD-10-CM | POA: Diagnosis not present

## 2015-10-28 DIAGNOSIS — B9620 Unspecified Escherichia coli [E. coli] as the cause of diseases classified elsewhere: Secondary | ICD-10-CM | POA: Diagnosis present

## 2015-10-28 DIAGNOSIS — E876 Hypokalemia: Secondary | ICD-10-CM | POA: Diagnosis present

## 2015-10-28 DIAGNOSIS — G8929 Other chronic pain: Secondary | ICD-10-CM | POA: Diagnosis present

## 2015-10-28 DIAGNOSIS — N1330 Unspecified hydronephrosis: Secondary | ICD-10-CM | POA: Diagnosis present

## 2015-10-28 DIAGNOSIS — I25110 Atherosclerotic heart disease of native coronary artery with unstable angina pectoris: Secondary | ICD-10-CM | POA: Diagnosis present

## 2015-10-28 DIAGNOSIS — R1110 Vomiting, unspecified: Secondary | ICD-10-CM

## 2015-10-28 DIAGNOSIS — Z800 Family history of malignant neoplasm of digestive organs: Secondary | ICD-10-CM

## 2015-10-28 DIAGNOSIS — I959 Hypotension, unspecified: Secondary | ICD-10-CM | POA: Diagnosis present

## 2015-10-28 DIAGNOSIS — I25119 Atherosclerotic heart disease of native coronary artery with unspecified angina pectoris: Secondary | ICD-10-CM | POA: Diagnosis present

## 2015-10-28 DIAGNOSIS — N10 Acute tubulo-interstitial nephritis: Secondary | ICD-10-CM | POA: Diagnosis present

## 2015-10-28 DIAGNOSIS — I130 Hypertensive heart and chronic kidney disease with heart failure and stage 1 through stage 4 chronic kidney disease, or unspecified chronic kidney disease: Secondary | ICD-10-CM | POA: Diagnosis present

## 2015-10-28 DIAGNOSIS — G35 Multiple sclerosis: Secondary | ICD-10-CM | POA: Diagnosis present

## 2015-10-28 DIAGNOSIS — Z79899 Other long term (current) drug therapy: Secondary | ICD-10-CM

## 2015-10-28 DIAGNOSIS — N289 Disorder of kidney and ureter, unspecified: Secondary | ICD-10-CM

## 2015-10-28 DIAGNOSIS — B961 Klebsiella pneumoniae [K. pneumoniae] as the cause of diseases classified elsewhere: Secondary | ICD-10-CM | POA: Diagnosis present

## 2015-10-28 DIAGNOSIS — D509 Iron deficiency anemia, unspecified: Secondary | ICD-10-CM | POA: Diagnosis present

## 2015-10-28 DIAGNOSIS — E1122 Type 2 diabetes mellitus with diabetic chronic kidney disease: Secondary | ICD-10-CM | POA: Diagnosis present

## 2015-10-28 DIAGNOSIS — E1140 Type 2 diabetes mellitus with diabetic neuropathy, unspecified: Secondary | ICD-10-CM | POA: Diagnosis present

## 2015-10-28 LAB — TROPONIN I

## 2015-10-28 LAB — CBC WITH DIFFERENTIAL/PLATELET
BASOS ABS: 0.1 10*3/uL (ref 0–0.1)
Basophils Relative: 0 %
EOS ABS: 0.1 10*3/uL (ref 0–0.7)
EOS PCT: 1 %
HCT: 38.2 % (ref 35.0–47.0)
Hemoglobin: 13.2 g/dL (ref 12.0–16.0)
Lymphocytes Relative: 5 %
Lymphs Abs: 0.8 10*3/uL — ABNORMAL LOW (ref 1.0–3.6)
MCH: 27.1 pg (ref 26.0–34.0)
MCHC: 34.6 g/dL (ref 32.0–36.0)
MCV: 78.3 fL — ABNORMAL LOW (ref 80.0–100.0)
Monocytes Absolute: 0.9 10*3/uL (ref 0.2–0.9)
Monocytes Relative: 6 %
NEUTROS PCT: 88 %
Neutro Abs: 14.2 10*3/uL — ABNORMAL HIGH (ref 1.4–6.5)
PLATELETS: 160 10*3/uL (ref 150–440)
RBC: 4.88 MIL/uL (ref 3.80–5.20)
RDW: 14.2 % (ref 11.5–14.5)
WBC: 16.1 10*3/uL — AB (ref 3.6–11.0)

## 2015-10-28 LAB — COMPREHENSIVE METABOLIC PANEL
ALT: 17 U/L (ref 14–54)
AST: 24 U/L (ref 15–41)
Albumin: 4 g/dL (ref 3.5–5.0)
Alkaline Phosphatase: 84 U/L (ref 38–126)
Anion gap: 12 (ref 5–15)
BILIRUBIN TOTAL: 0.8 mg/dL (ref 0.3–1.2)
BUN: 33 mg/dL — AB (ref 6–20)
CO2: 22 mmol/L (ref 22–32)
CREATININE: 2.32 mg/dL — AB (ref 0.44–1.00)
Calcium: 8.6 mg/dL — ABNORMAL LOW (ref 8.9–10.3)
Chloride: 101 mmol/L (ref 101–111)
GFR, EST AFRICAN AMERICAN: 28 mL/min — AB (ref >60)
GFR, EST NON AFRICAN AMERICAN: 24 mL/min — AB (ref >60)
Glucose, Bld: 123 mg/dL — ABNORMAL HIGH (ref 65–99)
POTASSIUM: 3.7 mmol/L (ref 3.5–5.1)
Sodium: 135 mmol/L (ref 135–145)
TOTAL PROTEIN: 7.4 g/dL (ref 6.5–8.1)

## 2015-10-28 LAB — GLUCOSE, CAPILLARY
GLUCOSE-CAPILLARY: 98 mg/dL (ref 65–99)
GLUCOSE-CAPILLARY: 108 mg/dL — AB (ref 65–99)
Glucose-Capillary: 388 mg/dL — ABNORMAL HIGH (ref 65–99)

## 2015-10-28 LAB — CBC
HEMATOCRIT: 36.3 % (ref 35.0–47.0)
HEMOGLOBIN: 12.5 g/dL (ref 12.0–16.0)
MCH: 26.9 pg (ref 26.0–34.0)
MCHC: 34.3 g/dL (ref 32.0–36.0)
MCV: 78.4 fL — AB (ref 80.0–100.0)
Platelets: 155 10*3/uL (ref 150–440)
RBC: 4.63 MIL/uL (ref 3.80–5.20)
RDW: 14.5 % (ref 11.5–14.5)
WBC: 16.7 10*3/uL — AB (ref 3.6–11.0)

## 2015-10-28 LAB — TYPE AND SCREEN
ABO/RH(D): O NEG
Antibody Screen: NEGATIVE

## 2015-10-28 LAB — ECHOCARDIOGRAM LIMITED
HEIGHTINCHES: 62 in
WEIGHTICAEL: 2864 [oz_av]

## 2015-10-28 LAB — APTT: aPTT: 27 seconds (ref 24–36)

## 2015-10-28 LAB — CREATININE, SERUM
Creatinine, Ser: 1.96 mg/dL — ABNORMAL HIGH (ref 0.44–1.00)
GFR, EST AFRICAN AMERICAN: 34 mL/min — AB (ref >60)
GFR, EST NON AFRICAN AMERICAN: 29 mL/min — AB (ref >60)

## 2015-10-28 LAB — PROTIME-INR
INR: 1.1
Prothrombin Time: 14.2 seconds (ref 11.4–15.2)

## 2015-10-28 MED ORDER — ACETAMINOPHEN 325 MG PO TABS
650.0000 mg | ORAL_TABLET | Freq: Four times a day (QID) | ORAL | Status: DC | PRN
  Administered 2015-10-28 – 2015-10-30 (×4): 650 mg via ORAL
  Filled 2015-10-28 (×4): qty 2

## 2015-10-28 MED ORDER — MORPHINE SULFATE (PF) 4 MG/ML IV SOLN
4.0000 mg | Freq: Once | INTRAVENOUS | Status: AC
  Administered 2015-10-28: 4 mg via INTRAVENOUS
  Filled 2015-10-28: qty 1

## 2015-10-28 MED ORDER — NITROGLYCERIN 0.4 MG SL SUBL
0.4000 mg | SUBLINGUAL_TABLET | SUBLINGUAL | Status: DC | PRN
  Administered 2015-10-28: 0.4 mg via SUBLINGUAL
  Filled 2015-10-28: qty 1

## 2015-10-28 MED ORDER — ISOSORBIDE MONONITRATE ER 60 MG PO TB24
60.0000 mg | ORAL_TABLET | Freq: Every day | ORAL | Status: DC
  Filled 2015-10-28: qty 1

## 2015-10-28 MED ORDER — PANTOPRAZOLE SODIUM 40 MG PO TBEC
40.0000 mg | DELAYED_RELEASE_TABLET | Freq: Two times a day (BID) | ORAL | Status: DC
  Administered 2015-10-28 – 2015-11-02 (×10): 40 mg via ORAL
  Filled 2015-10-28 (×10): qty 1

## 2015-10-28 MED ORDER — ZOLPIDEM TARTRATE 5 MG PO TABS
5.0000 mg | ORAL_TABLET | Freq: Every evening | ORAL | Status: DC | PRN
  Administered 2015-10-29 – 2015-10-30 (×2): 5 mg via ORAL
  Filled 2015-10-28 (×2): qty 1

## 2015-10-28 MED ORDER — ONDANSETRON HCL 4 MG PO TABS: 4.0000 mg | ORAL_TABLET | Freq: Four times a day (QID) | ORAL | Status: DC | PRN

## 2015-10-28 MED ORDER — TRAZODONE HCL 100 MG PO TABS
200.0000 mg | ORAL_TABLET | Freq: Every day | ORAL | Status: DC
  Administered 2015-10-28 – 2015-11-01 (×5): 200 mg via ORAL
  Filled 2015-10-28 (×5): qty 2

## 2015-10-28 MED ORDER — SODIUM CHLORIDE 0.9 % IV SOLN
INTRAVENOUS | Status: DC
  Administered 2015-10-28 – 2015-10-30 (×4): via INTRAVENOUS

## 2015-10-28 MED ORDER — INFLUENZA VAC SPLIT QUAD 0.5 ML IM SUSY
0.5000 mL | PREFILLED_SYRINGE | INTRAMUSCULAR | Status: AC
  Administered 2015-11-02: 0.5 mL via INTRAMUSCULAR
  Filled 2015-10-28: qty 0.5

## 2015-10-28 MED ORDER — ASPIRIN EC 81 MG PO TBEC
81.0000 mg | DELAYED_RELEASE_TABLET | Freq: Every day | ORAL | Status: DC
  Administered 2015-10-29 – 2015-11-02 (×5): 81 mg via ORAL
  Filled 2015-10-28 (×6): qty 1

## 2015-10-28 MED ORDER — ASPIRIN EC 81 MG PO TBEC
244.0000 mg | DELAYED_RELEASE_TABLET | Freq: Once | ORAL | Status: AC
  Administered 2015-10-28: 244 mg via ORAL
  Filled 2015-10-28: qty 3

## 2015-10-28 MED ORDER — SODIUM CHLORIDE 0.9% FLUSH
3.0000 mL | Freq: Two times a day (BID) | INTRAVENOUS | Status: DC
  Administered 2015-10-30 – 2015-11-02 (×6): 3 mL via INTRAVENOUS

## 2015-10-28 MED ORDER — CANAGLIFLOZIN 100 MG PO TABS: 100.0000 mg | ORAL_TABLET | Freq: Every day | ORAL | Status: DC

## 2015-10-28 MED ORDER — ONDANSETRON HCL 4 MG/2ML IJ SOLN: 4.0000 mg | Freq: Four times a day (QID) | INTRAMUSCULAR | Status: DC | PRN

## 2015-10-28 MED ORDER — ACETAMINOPHEN 650 MG RE SUPP: 650.0000 mg | Freq: Four times a day (QID) | RECTAL | Status: DC | PRN

## 2015-10-28 MED ORDER — SODIUM CHLORIDE 0.9 % IV BOLUS (SEPSIS)
1000.0000 mL | Freq: Once | INTRAVENOUS | Status: AC
  Administered 2015-10-28: 1000 mL via INTRAVENOUS

## 2015-10-28 MED ORDER — ADULT MULTIVITAMIN W/MINERALS CH
1.0000 | ORAL_TABLET | Freq: Every day | ORAL | Status: DC
  Administered 2015-10-29 – 2015-11-02 (×5): 1 via ORAL
  Filled 2015-10-28 (×5): qty 1

## 2015-10-28 MED ORDER — BACLOFEN 10 MG PO TABS
10.0000 mg | ORAL_TABLET | Freq: Two times a day (BID) | ORAL | Status: DC | PRN
  Administered 2015-10-29: 10 mg via ORAL
  Filled 2015-10-28: qty 1

## 2015-10-28 MED ORDER — GABAPENTIN 300 MG PO CAPS
300.0000 mg | ORAL_CAPSULE | Freq: Three times a day (TID) | ORAL | Status: DC
  Administered 2015-10-28 – 2015-11-02 (×15): 300 mg via ORAL
  Filled 2015-10-28 (×15): qty 1

## 2015-10-28 MED ORDER — INSULIN ASPART 100 UNIT/ML ~~LOC~~ SOLN
0.0000 [IU] | Freq: Every day | SUBCUTANEOUS | Status: DC
  Administered 2015-10-28: 5 [IU] via SUBCUTANEOUS
  Administered 2015-10-29: 2 [IU] via SUBCUTANEOUS
  Administered 2015-10-30 – 2015-10-31 (×2): 4 [IU] via SUBCUTANEOUS
  Filled 2015-10-28 (×2): qty 4
  Filled 2015-10-28: qty 5
  Filled 2015-10-28: qty 2

## 2015-10-28 MED ORDER — CITALOPRAM HYDROBROMIDE 20 MG PO TABS
40.0000 mg | ORAL_TABLET | Freq: Every day | ORAL | Status: DC
  Administered 2015-10-29 – 2015-11-02 (×5): 40 mg via ORAL
  Filled 2015-10-28 (×5): qty 2

## 2015-10-28 MED ORDER — LEVOTHYROXINE SODIUM 25 MCG PO TABS
25.0000 ug | ORAL_TABLET | Freq: Every day | ORAL | Status: DC
  Administered 2015-10-29 – 2015-11-02 (×5): 25 ug via ORAL
  Filled 2015-10-28 (×5): qty 1

## 2015-10-28 MED ORDER — DILTIAZEM HCL ER 60 MG PO CP12
120.0000 mg | ORAL_CAPSULE | Freq: Every day | ORAL | Status: DC
Start: 2015-10-29 — End: 2015-11-02
  Administered 2015-10-29 – 2015-11-02 (×5): 120 mg via ORAL
  Filled 2015-10-28 (×5): qty 2

## 2015-10-28 MED ORDER — ROSUVASTATIN CALCIUM 20 MG PO TABS
40.0000 mg | ORAL_TABLET | Freq: Every day | ORAL | Status: DC
  Administered 2015-10-28 – 2015-11-01 (×5): 40 mg via ORAL
  Filled 2015-10-28 (×5): qty 2

## 2015-10-28 MED ORDER — INSULIN ASPART 100 UNIT/ML ~~LOC~~ SOLN
0.0000 [IU] | Freq: Three times a day (TID) | SUBCUTANEOUS | Status: DC
  Administered 2015-10-29 (×2): 2 [IU] via SUBCUTANEOUS
  Administered 2015-10-29: 3 [IU] via SUBCUTANEOUS
  Administered 2015-10-30: 1 [IU] via SUBCUTANEOUS
  Administered 2015-10-30 (×2): 2 [IU] via SUBCUTANEOUS
  Administered 2015-10-31: 1 [IU] via SUBCUTANEOUS
  Administered 2015-10-31: 2 [IU] via SUBCUTANEOUS
  Administered 2015-10-31: 1 [IU] via SUBCUTANEOUS
  Administered 2015-11-01: 2 [IU] via SUBCUTANEOUS
  Administered 2015-11-01: 1 [IU] via SUBCUTANEOUS
  Administered 2015-11-01: 2 [IU] via SUBCUTANEOUS
  Filled 2015-10-28 (×3): qty 1
  Filled 2015-10-28 (×2): qty 2
  Filled 2015-10-28: qty 1
  Filled 2015-10-28: qty 2
  Filled 2015-10-28: qty 3
  Filled 2015-10-28: qty 2
  Filled 2015-10-28: qty 1
  Filled 2015-10-28 (×3): qty 2

## 2015-10-28 MED ORDER — CLOPIDOGREL BISULFATE 75 MG PO TABS
75.0000 mg | ORAL_TABLET | Freq: Every day | ORAL | Status: DC
  Administered 2015-10-29 – 2015-11-02 (×5): 75 mg via ORAL
  Filled 2015-10-28 (×5): qty 1

## 2015-10-28 MED ORDER — SODIUM CHLORIDE 0.9 % IV BOLUS (SEPSIS)
500.0000 mL | Freq: Once | INTRAVENOUS | Status: AC
  Administered 2015-10-28: 500 mL via INTRAVENOUS

## 2015-10-28 MED ORDER — ONDANSETRON HCL 4 MG/2ML IJ SOLN
4.0000 mg | Freq: Once | INTRAMUSCULAR | Status: AC
  Administered 2015-10-28: 4 mg via INTRAVENOUS
  Filled 2015-10-28: qty 2

## 2015-10-28 MED ORDER — MORPHINE SULFATE (PF) 4 MG/ML IV SOLN
4.0000 mg | Freq: Once | INTRAVENOUS | Status: AC
Start: 2015-10-28 — End: 2015-10-28
  Administered 2015-10-28: 4 mg via INTRAVENOUS
  Filled 2015-10-28: qty 1

## 2015-10-28 MED ORDER — HEPARIN SODIUM (PORCINE) 5000 UNIT/ML IJ SOLN
5000.0000 [IU] | Freq: Three times a day (TID) | INTRAMUSCULAR | Status: DC
  Administered 2015-10-28 – 2015-10-29 (×4): 5000 [IU] via SUBCUTANEOUS
  Filled 2015-10-28 (×4): qty 1

## 2015-10-28 NOTE — ED Notes (Signed)
[employer] at bedside.  

## 2015-10-28 NOTE — ED Notes (Signed)
Admitting MD at bedside.

## 2015-10-28 NOTE — ED Provider Notes (Signed)
[facility] Emergency Department Provider Note  ____________________________________________  Time seen: Approximately 10:18 AM  I have reviewed the triage vital signs and the nursing notes.   HISTORY  Chief Complaint Chest Pain    HPI [name] is a 45 y.o. female w/ a hx of CAD s/p MI, chronic CHF, aortic arch aneurysm, CVA, CKD, presenting for chest pain.The patient reports that she has chronic angina which she treats with nitroglycerin. Over the past 2 weeks, she has had to increase the amount of nitroglycerin that she has been taking.  At 3 AM today, she will woke up with a "sharp" substernal chest pain which radiated to the left shoulder, left neck, and back. She had associated diaphoresis, shortness breath, nausea with vomiting. Her pain has continued to come and go and decreases from 10/10 to 7/10 when she takes SL Nitro. She denies any lightheadedness or syncope. She denies any recent illness including cough or cold symptoms. She is on Plavix and low-dose aspirin and took both of those medications this morning.  Past Medical History:  Diagnosis Date  . Anxiety   . Aortic arch aneurysm ([place])   . CAD (coronary artery disease)    a. cath 11/2014: LM: nl, mLAD 85-90% s/p PCI/DES (Xience 3.25 x 23), mLCX 30%, RCA nl;; b. cath 04/2015: 70% mD1 @ bifurcation. Patent LAD stent - > Med Rx.; c. Cath-PCI 5/'17: Patent LAD stent, D1 now ~80% - PCI Resolute DES 2.25 x 12.    . Chronic systolic CHF (congestive heart failure) ([place])    a. echo 01/29/2013 EF 40-45%, basal anteroseptal & mid anteroseptal segments abnl; b echo 05/2012 EF 50-55%, mildly dilated LA, nl RVSP  . CKD (chronic kidney disease), stage IV ([place])   . Colon polyp   . CVA (cerebral vascular accident) ([place])    a. 06/2008-TEE nl LV fxn; b. 01/2013 - notes indicate short run of SVT at that time  . Depression   . Diabetes mellitus without complication ([place])   . Gastric ulcer 04/27/2011  . GERD  (gastroesophageal reflux disease)   . Headache    Migrains  . Hyperlipidemia   . Hypertension   . Hypothyroidism   . Ischemic cardiomyopathy   . Malignant melanoma of skin of scalp ([place])   . MS (mitral stenosis)   . Multiple sclerosis ([place]) 2015  . Orthostatic hypotension   . Syncope     Patient Active Problem List   Diagnosis Date Noted  . CKD (chronic kidney disease) stage 3, GFR 30-59 ml/min 06/05/2015  . Presence of drug coated stent in LAD coronary artery & now in D1 06/04/2015  . MI, acute, non ST segment elevation ([place])   . Coronary artery disease involving native coronary artery of native heart with angina pectoris with documented spasm ([place])   . Uncontrolled type 2 diabetes mellitus with circulatory disorder ([place])   . NSTEMI (non-ST elevated myocardial infarction) ([place]) 06/03/2015  . Colitis 06/03/2015  . Carotid stenosis 04/30/2015  . Type 2 diabetes mellitus with complication, without long-term current use of insulin ([place])   . Pain in the chest   . Urinary tract infection 04/05/2015  . Iron deficiency anemia 03/22/2015  . Depression 02/22/2015  . Diabetes type 2, controlled ([place]) 02/19/2015  . Vitamin B12 deficiency 02/18/2015  . Non compliance w medication regimen 02/18/2015  . Misuse of medications for pain 02/18/2015  . Chest pain 02/16/2015  . Severe recurrent major depression without psychotic features ([place]) 02/15/2015  .  Helicobacter pylori infection 11/23/2014  . Hemiparesis, left ([place]) 11/23/2014  . Benign neoplasm of colon 11/20/2014  . Coronary artery disease involving native coronary artery of native heart with unstable angina pectoris ([place])   . Malignant melanoma ([place]) 08/25/2014  . Chronic kidney disease (CKD), stage III (moderate) 08/25/2014  . Chronic systolic CHF (congestive heart failure) ([place])   . Incomplete bladder emptying 07/12/2014  . Adult hypothyroidism 12/30/2013  . Aberrant subclavian artery 11/17/2013  . Multiple sclerosis ([place])  11/02/2013  . Cerebrovascular accident (CVA) ([place]) 06/20/2013  . Headache, migraine 05/29/2013  . Diabetes mellitus ([place]) 05/29/2013  . Type 2 diabetes mellitus ([place]) 12/12/2012  . Hyperlipidemia   . GERD (gastroesophageal reflux disease)   . Neuropathy ([place]) 01/02/2011  . CVA (cerebral vascular accident) ([place]) 06/21/2008  . Essential hypertension 05/01/2008    Past Surgical History:  Procedure Laterality Date  . APPENDECTOMY    . CARDIAC CATHETERIZATION N/A 11/09/2014   Procedure: Coronary Angiography;  Surgeon: Minna Merritts, MD;  Location: [place] [facility];  Service: Cardiovascular;  Laterality: N/A;  . CARDIAC CATHETERIZATION N/A 11/12/2014   Procedure: Coronary Stent Intervention;  Surgeon: Isaias Cowman, MD;  Location: [place] [facility];  Service: Cardiovascular;  Laterality: N/A;  . CARDIAC CATHETERIZATION N/A 04/18/2015   Procedure: Left Heart Cath and Coronary Angiography;  Surgeon: Minna Merritts, MD;  Location: [place] [facility];  Service: Cardiovascular;  Laterality: N/A;  . CARDIAC CATHETERIZATION Left 06/04/2015   Procedure: Left Heart Cath and Coronary Angiography;  Surgeon: Wellington Hampshire, MD;  Location: [place] [facility];  Service: Cardiovascular;  Laterality: Left;  . CARDIAC CATHETERIZATION N/A 06/04/2015   Procedure: Coronary Stent Intervention;  Surgeon: Wellington Hampshire, MD;  Location: [place] [facility];  Service: Cardiovascular;  Laterality: N/A;  . DILATION AND CURETTAGE OF UTERUS    . ESOPHAGOGASTRODUODENOSCOPY (EGD) WITH PROPOFOL N/A 09/14/2014   Procedure: ESOPHAGOGASTRODUODENOSCOPY (EGD) WITH PROPOFOL;  Surgeon: Josefine Class, MD;  Location: [facility] ENDOSCOPY;  Service: Endoscopy;  Laterality: N/A;  . GASTRIC BYPASS  09/2009   [facility]  2016   Dr. Evorn Gong  . RIGHT OOPHORECTOMY    . TRIGGER FINGER RELEASE    . TUBAL LIGATION     R tube removed still w/ Left    Current Outpatient Rx  .  Order #: [id number] Class: No Print  . Order #: [id number] Class: No Print  . Order #: [id number] Class: Historical Med  . Order #: [id number] Class: Historical Med  . Order #: [id number] Class: Print  . Order #: [id number] Class: Historical Med  . Order #: [id number] Class: Normal  . Order #: [id number] Class: Historical Med  . Order #: [id number] Class: Historical Med  . Order #: [id number] Class: Historical Med  . Order #: [id number] Class: Normal  . Order #: [id number] Class: Normal  . Order #: [id number] Class: Historical Med  . Order #: [id number] Class: Historical Med  . Order #: [id number] Class: Normal  . Order #: [id number] Class: Historical Med    Allergies Review of patient's allergies indicates no known allergies.  Family History  Problem Relation Age of Onset  . Hypertension Mother   . Anxiety disorder Mother   . Depression Mother   . Bipolar disorder Mother   . Heart disease Mother   . Hyperlipidemia Mother   . Kidney disease Father   . Heart disease Father   . Hypertension Father   . Diabetes Father   . Stroke Father   . Colon cancer Father  dx in his 61's  . Anxiety disorder Father   . Depression Father   . Skin cancer Father   . Kidney disease Sister   . Thyroid nodules Sister   . Hypertension Sister   . Hypertension Sister   . Diabetes Sister   . Hyperlipidemia Sister   . Depression Sister     Social History Social History  Substance Use Topics  . Smoking status: Former Smoker    Types: Cigarettes    Quit date: 08/31/1994  . Smokeless tobacco: Never Used     Comment: quit 28 years ago  . Alcohol use No    Review of Systems Constitutional: No fever/chills.No lightheadedness or syncope. Positive diaphoresis. Eyes: No visual changes. ENT: No sore throat. No congestion or rhinorrhea. Cardiovascular: Positive chest pain. Denies palpitations. Respiratory: Positive shortness of breath.  No cough. Gastrointestinal: No abdominal pain.  Positive nausea, positive vomiting.   No diarrhea.  No constipation. Genitourinary: Negative for dysuria. Musculoskeletal: Positive for back pain. Skin: Negative for rash. Neurological: Negative for headaches. No focal numbness, tingling or weakness.   10-point ROS otherwise negative.  ____________________________________________   PHYSICAL EXAM:  VITAL SIGNS: ED Triage Vitals  Enc Vitals Group     BP --      Pulse --      Resp --      Temp --      Temp src --      SpO2 --      Weight 10/28/15 0853 179 lb (81.2 kg)     Height 10/28/15 0853 5\' 2"  (1.575 m)     Head Circumference --      Peak Flow --      Pain Score 10/28/15 0854 10     Pain Loc --      Pain Edu? --      Excl. in [place]? --     Constitutional: Alert and oriented. Chronically ill appearing but nontoxic. Answers questions appropriately. Eyes: Conjunctivae are normal.  EOMI. No scleral icterus. Head: Atraumatic. Nose: No congestion/rhinnorhea. Mouth/Throat: Mucous membranes are moist.  Neck: No stridor.  Supple.  Pos JVD Cardiovascular: Normal rate, regular rhythm. No murmurs, rubs or gallops.  Respiratory: Normal respiratory effort.  No accessory muscle use or retractions. Lungs CTAB.  No wheezes, rales or ronchi. Gastrointestinal: Soft, nontender and nondistended.  No guarding or rebound.  No peritoneal signs. Musculoskeletal: No LE edema. No ttp in the calves or palpable cords.  Negative Homan's sign. Neurologic:  A&Ox3.  Speech is clear.  Face and smile are symmetric.  EOMI.  Moves all extremities well. Skin:  Skin is warm, dry and intact. No rash noted. Psychiatric: Mood and affect are normal. Speech and behavior are normal.  Normal judgement  ____________________________________________   LABS (all labs ordered are listed, but only abnormal results are displayed)  Labs Reviewed  COMPREHENSIVE METABOLIC PANEL - Abnormal; Notable for the following:       Result Value   Glucose, Bld 123 (*)    BUN 33 (*)    Creatinine, Ser 2.32 (*)     Calcium 8.6 (*)    GFR calc non Af Amer 24 (*)    GFR calc Af Amer 28 (*)    All other components within normal limits  CBC WITH DIFFERENTIAL/PLATELET - Abnormal; Notable for the following:    WBC 16.1 (*)    MCV 78.3 (*)    Neutro Abs 14.2 (*)    Lymphs Abs 0.8 (*)  All other components within normal limits  TROPONIN I  APTT  PROTIME-INR  TROPONIN I  TYPE AND SCREEN   ____________________________________________  EKG  ED ECG REPORT I, Eula Listen, the attending physician, personally viewed and interpreted this ECG.   Date: 10/28/2015  EKG Time: 857  Rate: 92  Rhythm: normal sinus rhythm; + PVC  Axis: normal  Intervals:none  ST&T Change: 1 mm of ST elevation in V1 and V2, no ST depression, no STEMI.  ____________________________________________  RADIOLOGY  Dg Chest 2 View  Result Date: 10/28/2015 CLINICAL DATA:  Chest pain. EXAM: CHEST  2 VIEW COMPARISON:  CT 05/31/2015. FINDINGS: Surgical clips upper chest. Right-sided aortic arch is noted. Heart size normal. Lungs are clear. No pleural effusion or pneumothorax. Degenerative changes thoracic spine. IMPRESSION: No acute cardiopulmonary disease. Right-sided aortic arch noted. Heart size normal. Electronically Signed   By: Marcello Moores  Register   On: 10/28/2015 10:49    ____________________________________________   PROCEDURES  Procedure(s) performed: None  Procedures  Critical Care performed: No ____________________________________________   INITIAL IMPRESSION / ASSESSMENT AND PLAN / ED COURSE  Pertinent labs & imaging results that were available during my care of the patient were reviewed by me and considered in my medical decision making (see chart for details).  45 y.o. female with a known aortic arch aneurysm and CAD status post stent presenting with chest pain. This patient is extremely high risk for ACS or MI, unstable angina, as well as aortic pathology. Unfortunate, she has acute on chronic  renal insufficiency today and her creatinine is greater than 2.3. I am concerned about giving her IV contrast to evaluate her aorta, and given that her aneurysm is in the arch, I have ordered an immediate echocardiogram to see if were able to evaluate her aorta this way. In addition, her troponin is negative but we'll continue to follow this serially. We will treat the patient with morphine for pain until I am able to establish that she does not have aortic pathology, as I do not want to decrease her preload. She has taken Plavix and low-dose aspirin today, and I will hold on any further anticoagulation until I have a result for her aortic pathology. I have a call to Dr. [name], who is her primary cardiologist as well.  ----------------------------------------- 11:06 AM on 10/28/2015 -----------------------------------------  I have reviewed the patient's previous imaging and she has a CT angiogram from 4/17 which does not note an aortic aneurysm however she does have a right aortic arch with aberrant subclavian artery.   ----------------------------------------- 11:23 AM on 10/28/2015 -----------------------------------------  Spoke w/ Dr. [name] who agrees with plan.  Right aortic arch is unlikely to predispose the patient aortic dissection, but we will proceed with the plan for stat echocardiogram. If the echocardiogram does not show aortic pathology, and she has negative cardiac markers 2, Dr. [name] would not recommend anticoagulation. If her aorta looks okay, will be able to proceed with nitroglycerin. However, the patient will still require admission for her acute on chronic renal insufficiency.  ----------------------------------------- 1:28 PM on 10/28/2015 -----------------------------------------  The patient's echocardiogram does not show any wall motion abnormality, she has an EF of 55-60%, and aortic root is normal in size. At this time, I will supplement her aspirin to complete to  full aspirin dosage for today. She will be admitted to the hospitalists for her renal insufficiency and chest pain evaluation. ____________________________________________  FINAL CLINICAL IMPRESSION(S) / ED DIAGNOSES  Final diagnoses:  Acute renal insufficiency  Chest  pain, unspecified chest pain type  Diaphoresis  Shortness of breath  Non-intractable vomiting with nausea, vomiting of unspecified type    Clinical Course      NEW MEDICATIONS STARTED DURING THIS VISIT:  New Prescriptions   No medications on file      Eula Listen, MD 10/28/15 1329

## 2015-10-28 NOTE — H&P (Signed)
[facility] [place] NAME: [name]    MR#:  [id number]  DATE OF BIRTH:  Jun 29, 1970  DATE OF ADMISSION:  10/28/2015  PRIMARY CARE PHYSICIAN: Kendra Huh, MD   REQUESTING/REFERRING PHYSICIAN: Dr. Mariea Clonts.  CHIEF COMPLAINT:   Chief Complaint  Patient presents with  . Chest Pain    HISTORY OF PRESENT ILLNESS:  [name]  is a 45 y.o. female with a known history of Anxiety, history of aortic arch aneurysm, history of coronary disease status post stent placement, chronic systolic CHF, chronic kidney disease stage III, history of previous CVA, diabetes, depression, GERD who presents to the hospital due to chest pain. Patient describes the chest pain being a pressure-like sensation in the center of her chest radiating sometimes to her neck. It is associated with some shortness of breath and also some nausea and vomiting that she had a couple days back. This morning at 3 AM she was sleeping in her chest pain was much severe and woke her up. She therefore came to the ER for further evaluation. She had a negative EKG, negative cardiac markers, and a limited echocardiogram showing no evidence of aortic arch disease. Given her risk factors Hospitalist services were called for further treatment and evaluation.  PAST MEDICAL HISTORY:   Past Medical History:  Diagnosis Date  . Anxiety   . Aortic arch aneurysm ([place])   . CAD (coronary artery disease)    a. cath 11/2014: LM: nl, mLAD 85-90% s/p PCI/DES (Xience 3.25 x 23), mLCX 30%, RCA nl;; b. cath 04/2015: 70% mD1 @ bifurcation. Patent LAD stent - > Med Rx.; c. Cath-PCI 5/'17: Patent LAD stent, D1 now ~80% - PCI Resolute DES 2.25 x 12.    . Chronic systolic CHF (congestive heart failure) ([place])    a. echo 01/29/2013 EF 40-45%, basal anteroseptal & mid anteroseptal segments abnl; b echo 05/2012 EF 50-55%, mildly dilated LA, nl RVSP  . CKD (chronic kidney disease), stage IV ([place])   . Colon polyp   .  CVA (cerebral vascular accident) ([name])    a. 06/2008-TEE nl LV fxn; b. 01/2013 - notes indicate short run of SVT at that time  . Depression   . Diabetes mellitus without complication ([place])   . Gastric ulcer 04/27/2011  . GERD (gastroesophageal reflux disease)   . Headache    Migrains  . Hyperlipidemia   . Hypertension   . Hypothyroidism   . Ischemic cardiomyopathy   . Malignant melanoma of skin of scalp ([place])   . MS (mitral stenosis)   . Multiple sclerosis ([place]) 2015  . Orthostatic hypotension   . Syncope     PAST SURGICAL HISTORY:   Past Surgical History:  Procedure Laterality Date  . APPENDECTOMY    . CARDIAC CATHETERIZATION N/A 11/09/2014   Procedure: Coronary Angiography;  Surgeon: Minna Merritts, MD;  Location: [facility];  Service: Cardiovascular;  Laterality: N/A;  . CARDIAC CATHETERIZATION N/A 11/12/2014   Procedure: Coronary Stent Intervention;  Surgeon: Isaias Cowman, MD;  Location: [facility];  Service: Cardiovascular;  Laterality: N/A;  . CARDIAC CATHETERIZATION N/A 04/18/2015   Procedure: Left Heart Cath and Coronary Angiography;  Surgeon: Minna Merritts, MD;  Location: [facility];  Service: Cardiovascular;  Laterality: N/A;  . CARDIAC CATHETERIZATION Left 06/04/2015   Procedure: Left Heart Cath and Coronary Angiography;  Surgeon: Wellington Hampshire, MD;  Location: [facility];  Service: Cardiovascular;  Laterality: Left;  . CARDIAC CATHETERIZATION N/A 06/04/2015   Procedure: Coronary Stent Intervention;  Surgeon: Wellington Hampshire, MD;  Location: [facility];  Service: Cardiovascular;  Laterality: N/A;  . DILATION AND CURETTAGE OF UTERUS    . ESOPHAGOGASTRODUODENOSCOPY (EGD) WITH PROPOFOL N/A 09/14/2014   Procedure: ESOPHAGOGASTRODUODENOSCOPY (EGD) WITH PROPOFOL;  Surgeon: Josefine Class, MD;  Location[facility];  Service: Endoscopy;  Laterality: N/A;  . GASTRIC BYPASS  09/2009   [facility]  2016   Dr. Evorn Gong  . RIGHT OOPHORECTOMY    . TRIGGER FINGER RELEASE    . TUBAL LIGATION     R tube removed still w/ Left    SOCIAL HISTORY:   Social History  Substance Use Topics  . Smoking status: Former Smoker    Types: Cigarettes    Quit date: 08/31/1994  . Smokeless tobacco: Never Used     Comment: quit 28 years ago  . Alcohol use No    FAMILY HISTORY:   Family History  Problem Relation Age of Onset  . Hypertension Mother   . Anxiety disorder Mother   . Depression Mother   . Bipolar disorder Mother   . Heart disease Mother   . Hyperlipidemia Mother   . Kidney disease Father   . Heart disease Father   . Hypertension Father   . Diabetes Father   . Stroke Father   . Colon cancer Father     dx in his 68's  . Anxiety disorder Father   . Depression Father   . Skin cancer Father   . Kidney disease Sister   . Thyroid nodules Sister   . Hypertension Sister   . Hypertension Sister   . Diabetes Sister   . Hyperlipidemia Sister   . Depression Sister     DRUG ALLERGIES:  No Known Allergies  REVIEW OF SYSTEMS:   Review of Systems  Constitutional: Negative for fever and weight loss.  HENT: Negative for congestion, nosebleeds and tinnitus.   Eyes: Negative for blurred vision, double vision and redness.  Respiratory: Negative for cough, hemoptysis and shortness of breath.   Cardiovascular: Positive for chest pain. Negative for orthopnea, leg swelling and PND.  Gastrointestinal: Negative for abdominal pain, diarrhea, melena, nausea and vomiting.  Genitourinary: Negative for dysuria, hematuria and urgency.  Musculoskeletal: Negative for falls and joint pain.  Neurological: Negative for dizziness, tingling, sensory change, focal weakness, seizures, weakness and headaches.  Endo/Heme/Allergies: Negative for polydipsia. Does not bruise/bleed easily.  Psychiatric/Behavioral: Negative for depression and memory loss. The patient is not  nervous/anxious.     MEDICATIONS AT HOME:   Prior to Admission medications   Medication Sig Start Date End Date Taking? Authorizing Provider  aspirin EC 81 MG tablet Take 1 tablet (81 mg total) by mouth daily. 02/19/15  Yes Hildred Priest, MD  baclofen (LIORESAL) 10 MG tablet Take 1 tablet (10 mg total) by mouth 2 (two) times daily as needed for muscle spasms. 02/19/15  Yes Hildred Priest, MD  citalopram (CELEXA) 40 MG tablet Take 40 mg by mouth daily.   Yes Historical Provider, MD  clopidogrel (PLAVIX) 75 MG tablet Take 75 mg by mouth daily.   Yes Historical Provider, MD  diltiazem (CARDIZEM SR) 120 MG 12 hr capsule Take 120 mg by mouth daily.   Yes Historical Provider, MD  empagliflozin (JARDIANCE) 25 MG TABS tablet Take 25 mg by mouth daily.   Yes Historical Provider, MD  eszopiclone (  LUNESTA) 2 MG TABS tablet Take 1 tablet (2 mg total) by mouth at bedtime as needed for sleep. Take immediately before bedtime 08/14/15  Yes [name], MD  furosemide (LASIX) 20 MG tablet Take 20 mg by mouth daily.   Yes Historical Provider, MD  gabapentin (NEURONTIN) 300 MG capsule Take 300 mg by mouth 3 (three) times daily.   Yes Historical Provider, MD  isosorbide mononitrate (IMDUR) 60 MG 24 hr tablet Take 60 mg by mouth daily.   Yes Historical Provider, MD  levothyroxine (SYNTHROID, LEVOTHROID) 25 MCG tablet Take 1 tablet (25 mcg total) by mouth daily before breakfast. 04/18/15  Yes Gladstone Lighter, MD  losartan (COZAAR) 25 MG tablet Take 25 mg by mouth daily. 05/05/15  Yes Historical Provider, MD  metFORMIN (GLUCOPHAGE) 1000 MG tablet Take 1,000 mg by mouth 2 (two) times daily with a meal.  05/09/15  Yes Historical Provider, MD  Multiple Vitamin (MULTIVITAMIN WITH MINERALS) TABS tablet Take 1 tablet by mouth daily.   Yes Historical Provider, MD  nitroGLYCERIN (NITROSTAT) 0.4 MG SL tablet Place 1 tablet (0.4 mg total) under the tongue every 5 (five) minutes as needed for chest pain.  04/30/15  Yes Minna Merritts, MD  pantoprazole (PROTONIX) 40 MG tablet Take 1 tablet (40 mg total) by mouth 2 (two) times daily. 04/18/15  Yes Gladstone Lighter, MD  promethazine (PHENERGAN) 25 MG tablet Take 25 mg by mouth every 6 (six) hours as needed for nausea or vomiting. Reported on 05/31/2015   Yes Historical Provider, MD  rosuvastatin (CRESTOR) 40 MG tablet Take 1 tablet (40 mg total) by mouth daily at 6 PM. 04/30/15  Yes Minna Merritts, MD  traZODone (DESYREL) 100 MG tablet Take 200 mg by mouth at bedtime.   Yes Historical Provider, MD  ergocalciferol (VITAMIN D2) 50000 units capsule Take 50,000 Units by mouth once a week.    Historical Provider, MD  pregabalin (LYRICA) 100 MG capsule Take 100 mg by mouth 2 (two) times daily.    Historical Provider, MD      VITAL SIGNS:  Blood pressure (!) 148/78, pulse 74, resp. rate 19, height 5\' 2"  (1.575 m), weight 81.2 kg (179 lb), SpO2 100 %.  PHYSICAL EXAMINATION:  Physical Exam  GENERAL:  45 y.o.-year-old patient lying in the bed with no acute distress.  EYES: Pupils equal, round, reactive to light and accommodation. No scleral icterus. Extraocular muscles intact.  HEENT: Head atraumatic, normocephalic. Oropharynx and nasopharynx clear. No oropharyngeal erythema, moist oral mucosa  NECK:  Supple, no jugular venous distention. No thyroid enlargement, no tenderness.  LUNGS: Normal breath sounds bilaterally, no wheezing, rales, rhonchi. No use of accessory muscles of respiration.  CARDIOVASCULAR: S1, S2 RRR. No murmurs, rubs, gallops, clicks.  ABDOMEN: Soft, nontender, nondistended. Bowel sounds present. No organomegaly or mass.  EXTREMITIES: No pedal edema, cyanosis, or clubbing. + 2 pedal & radial pulses b/l.   NEUROLOGIC: Cranial nerves II through XII are intact. No focal Motor or sensory deficits appreciated b/l PSYCHIATRIC: The patient is alert and oriented x 3. Good affect.  SKIN: No obvious rash, lesion, or ulcer.   LABORATORY PANEL:    CBC  Recent Labs Lab 10/28/15 0859  WBC 16.1*  HGB 13.2  HCT 38.2  PLT 160   ------------------------------------------------------------------------------------------------------------------  Chemistries   Recent Labs Lab 10/28/15 0859  NA 135  K 3.7  CL 101  CO2 22  GLUCOSE 123*  BUN 33*  CREATININE 2.32*  CALCIUM 8.6*  AST 24  ALT 17  ALKPHOS 84  BILITOT 0.8   ------------------------------------------------------------------------------------------------------------------  Cardiac Enzymes  Recent Labs Lab 10/28/15 1410  TROPONINI <0.03   ------------------------------------------------------------------------------------------------------------------  RADIOLOGY:  Dg Chest 2 View  Result Date: 10/28/2015 CLINICAL DATA:  Chest pain. EXAM: CHEST  2 VIEW COMPARISON:  CT 05/31/2015. FINDINGS: Surgical clips upper chest. Right-sided aortic arch is noted. Heart size normal. Lungs are clear. No pleural effusion or pneumothorax. Degenerative changes thoracic spine. IMPRESSION: No acute cardiopulmonary disease. Right-sided aortic arch noted. Heart size normal. Electronically Signed   By: Marcello Moores  Register   On: 10/28/2015 10:49     IMPRESSION AND PLAN:   44 year old female with past medical history of hypertension, hyperlipidemia, coronary disease status post stent placement, history of aortic arch aneurysm, diabetes imaging to the hospital due to chest pain.  1. Chest pain-patient does have significant risk factors given her history of hypertension, previous history of coronary disease. -Her EKG shows no acute ST or T-wave changes, her first set of cardiac markers are negative. She had limited echocardiogram done which showed no evidence of aortic aneurysm. -We will observe on telemetry, cycle her cardiac markers. I will get a cardiology consult. -Continue aspirin, Plavix, Imdur, Crestor, Cardizem.  2. Acute kidney injury-patient has acute on chronic kidney  injury. Baseline creatinines are 1.5-1.6 currently elevated at 2.3. -I will gently hydrate with IV fluids, follow BUN and creatinine. -Hold losartan, Lasix for now. - get a nephrology consult.  3. Diabetes type 2 without complication-hold metformin given the patient's acute kidney injury. -We'll place on son's counseling.  4. GERD- cont.  Protonix.  5. Hypothyroidism-continue Synthroid.  6. Diabetic neuropathy-continue lyrica, gabapentin.  7. Hyperlipidemia - cont. Crestor.    All the records are reviewed and case discussed with ED provider. Management plans discussed with the patient, family and they are in agreement.  CODE STATUS: Full code  TOTAL TIME TAKING CARE OF THIS PATIENT: 45 minutes.    Henreitta Leber M.D on 10/28/2015 at 2:50 PM  Between 7am to 6pm - Pager - [phone]  After 6pm go to [email or web address] - password [email or web address] [facility]  Office  [phone]  CC: Primary care physician; Kendra Huh, MD

## 2015-10-28 NOTE — Progress Notes (Signed)
[facility]  ROUNDING NOTE   Subjective:  Patient well known to [place]. We follow her for outpatient management of chronic kidney disease. Baseline creatinine is 1.4 with an EGFR 45. She appears to have acute renal failure now in the setting of an episode of chest pain and nausea. Patient states that she woke up around 3 AM with substernal chest pain that was sharp in nature. This radiated to her neck. She is currently chest pain-free. Troponins thus far negative.   Objective:  Vital signs in last 24 hours:  Temp:  [102 F (38.9 C)] 102 F (38.9 C) (09/25 1602) Pulse Rate:  [74-87] 74 (09/25 1602) Resp:  [12-19] 16 (09/25 1602) BP: (112-148)/(57-94) 131/57 (09/25 1602) SpO2:  [94 %-100 %] 100 % (09/25 1602) Weight:  [81.2 kg (179 lb)-81.3 kg (179 lb 3.2 oz)] 81.3 kg (179 lb 3.2 oz) (09/25 1602)  Weight change:  Filed Weights   10/28/15 0853 10/28/15 1602  Weight: 81.2 kg (179 lb) 81.3 kg (179 lb 3.2 oz)    Intake/Output: No intake/output data recorded.   Intake/Output this shift:  Total I/O In: 1230 [IV Piggyback:1230] Out: 300 [Urine:300]  Physical Exam: General: No acute distress  Head: Normocephalic, atraumatic. Moist oral mucosal membranes  Eyes: Anicteric  Neck: Supple, trachea midline  Lungs:  Clear to auscultation, normal effort  Heart: S1S2 no rubs  Abdomen:  Soft, nontender, BS present  Extremities: No peripheral edema.  Neurologic: Nonfocal, moving all four extremities  Skin: No lesions  Access:     Basic Metabolic Panel:  Recent Labs Lab 10/28/15 0859  NA 135  K 3.7  CL 101  CO2 22  GLUCOSE 123*  BUN 33*  CREATININE 2.32*  CALCIUM 8.6*    Liver Function Tests:  Recent Labs Lab 10/28/15 0859  AST 24  ALT 17  ALKPHOS 84  BILITOT 0.8  PROT 7.4  ALBUMIN 4.0   No results for input(s): LIPASE, AMYLASE in the last 168 hours. No results for input(s): AMMONIA in the last 168 hours.  CBC:  Recent Labs Lab 10/28/15 0859   WBC 16.1*  NEUTROABS 14.2*  HGB 13.2  HCT 38.2  MCV 78.3*  PLT 160    Cardiac Enzymes:  Recent Labs Lab 10/28/15 0859 10/28/15 1410  TROPONINI <0.03 <0.03    BNP: Invalid input(s): POCBNP  CBG:  Recent Labs Lab 10/28/15 1527 10/28/15 1652  GLUCAP 98 108*    Microbiology: Results for orders placed or performed in visit on 08/14/15  Urine culture     Status: Abnormal   Collection Time: 08/15/15 12:00 AM  Result Value Ref Range Status   Urine Culture, Routine Final report (A)  Final   Urine Culture result 1 Escherichia coli (A)  Final    Comment: Greater than 100,000 colony forming units per mL   ANTIMICROBIAL SUSCEPTIBILITY Comment  Final    Comment:       ** S = Susceptible; I = Intermediate; R = Resistant **                    P = Positive; N = Negative             MICS are expressed in micrograms per mL    Antibiotic                 RSLT#1    RSLT#2    RSLT#3    RSLT#4 Amoxicillin/Clavulanic Acid    S Ampicillin  S Cefepime                       S Ceftriaxone                    S Cefuroxime                     S Cephalothin                    S Ciprofloxacin                  S Ertapenem                      S Gentamicin                     S Imipenem                       S Levofloxacin                   S Nitrofurantoin                 S Piperacillin                   S Tetracycline                   S Tobramycin                     S Trimethoprim/Sulfa             S     Coagulation Studies:  Recent Labs  10/28/15 0859  LABPROT 14.2  INR 1.10    Urinalysis: No results for input(s): COLORURINE, LABSPEC, PHURINE, GLUCOSEU, HGBUR, BILIRUBINUR, KETONESUR, PROTEINUR, UROBILINOGEN, NITRITE, LEUKOCYTESUR in the last 72 hours.  Invalid input(s): APPERANCEUR    Imaging: Dg Chest 2 View  Result Date: 10/28/2015 CLINICAL DATA:  Chest pain. EXAM: CHEST  2 VIEW COMPARISON:  CT 05/31/2015. FINDINGS: Surgical clips upper chest.  Right-sided aortic arch is noted. Heart size normal. Lungs are clear. No pleural effusion or pneumothorax. Degenerative changes thoracic spine. IMPRESSION: No acute cardiopulmonary disease. Right-sided aortic arch noted. Heart size normal. Electronically Signed   By: [place]   On: 10/28/2015 10:49     Medications:   . sodium chloride 75 mL/hr at 10/28/15 1713   . [START ON 10/29/2015] aspirin EC  81 mg Oral Daily  . [START ON 10/29/2015] citalopram  40 mg Oral Daily  . [START ON 10/29/2015] clopidogrel  75 mg Oral Daily  . [START ON 10/29/2015] diltiazem  120 mg Oral Daily  . gabapentin  300 mg Oral TID  . heparin  5,000 Units Subcutaneous Q8H  . [START ON 10/29/2015] Influenza vac split quadrivalent PF  0.5 mL Intramuscular Tomorrow-1000  . insulin aspart  0-5 Units Subcutaneous QHS  . insulin aspart  0-9 Units Subcutaneous TID WC  . [START ON 10/29/2015] isosorbide mononitrate  60 mg Oral Daily  . [START ON 10/29/2015] levothyroxine  25 mcg Oral QAC breakfast  . [START ON 10/29/2015] multivitamin with minerals  1 tablet Oral Daily  . pantoprazole  40 mg Oral BID  . rosuvastatin  40 mg Oral q1800  . sodium chloride flush  3 mL Intravenous Q12H  . traZODone  200 mg Oral QHS   acetaminophen **OR**  acetaminophen, baclofen, nitroGLYCERIN, ondansetron **OR** ondansetron (ZOFRAN) IV, zolpidem  Assessment/ Plan:  45 y.o. female [name] past medical history hypertension, diabetes mellitus for over 20 years, diabetic retinopathy, history of CVA, gastric ulcer, colonic polyps, hyperlipidemia, gastric bypass performed [facility], CVA with mild L sided weakness 2015, carotild artery bypass and depression who returns today for of chronic kidney disease stage III.  1. Acute renal failure/Chronic kidney disease stage III/proteinuria. Nephrology consultation requested for episode of acute renal failure. Baseline creatinine 1.4 with EGFR 45. BUN currently 33 with a creatinine of  2.3. This could be related to volume depletion. Patient had episode of nausea and vomiting earlier this a.m. Agree with holding losartan and diuretics for now. Continue IV fluid hydration.  If renal function does not improve tomorrow a.m. consider renal ultrasound.   2. Hypertension. Agree with holding losartan at this time. Continue to monitor blood pressure.   3. Iron deficiency anemia.  This has actually improved. Hemoglobin currently 12.5. No indication for Procrit.  4. Thanks for consultation.   LOS: 0 [name] 9/25/20175:47 PM

## 2015-10-28 NOTE — ED Triage Notes (Signed)
Reports chest pain onset 3am.  States nitro has not helped. Skin w/d with good color.

## 2015-10-28 NOTE — Progress Notes (Signed)
  Echocardiogram 2D Echocardiogram has been performed.  Jennette Dubin 10/28/2015, 11:47 AM

## 2015-10-28 NOTE — Consult Note (Signed)
Cardiology Consult    Patient ID: [name] MRN: [id number], DOB/AGE: 08-23-1970   Admit date: 10/28/2015 Date of Consult: 10/28/2015  Primary Physician: [name], MD Primary Cardiologist: Johnny Bridge, MD  Requesting Provider: Bobetta Lime  Patient Profile    45 y/o ? with a h/o CAD s/p prior LAD and D1 stenting, chronic syst CHF/ICM, HTN, HL, DM, CKD III-IV, and Ao arch aneurysm, who presented to [facility] today with chest pain.  Past Medical History   Past Medical History:  Diagnosis Date  . Anxiety   . Aortic arch aneurysm ([place])   . CAD (coronary artery disease)    a. cath 08/2003 nonobs dzs;  b. 11/2014: mLAD 85-90% s/p PCI/DES (Xience 3.25 x 23); c. cath 04/2015: 70% mD1 @ bifurcation. Patent LAD stent - > Med Rx.; d. Cath-PCI 5/17: Patent LAD stent, D1 now ~80% - PCI Resolute DES 2.25 x 12.    . Chronic systolic CHF (congestive heart failure) ([place])    a. echo 01/29/2013 EF 40-45%, basal anteroseptal & mid anteroseptal segments abnl; b echo 05/2012 EF 50-55%, mildly dilated LA, nl RVSP; c. 10/2015 Echo: EF 55-60%, mild conc LVH, no rwma.  . CKD (chronic kidney disease), stage IV ([place])   . Colon polyp   . CVA (cerebral vascular accident) ([place])    a. 06/2008-TEE nl LV fxn; b. 01/2013 - notes indicate short run of SVT at that time  . Depression   . Diabetes mellitus without complication ([place])   . Gastric ulcer 04/27/2011  . GERD (gastroesophageal reflux disease)   . Headache    Migrains  . Hyperlipidemia   . Hypertensive heart disease   . Hypothyroidism   . Ischemic cardiomyopathy    a. EF prev 40-45% in 2014-->Improved to 55-60% (10/2015).  . Malignant melanoma of skin of scalp ([name])   . Morbid obesity ([place])    a. s/p gastric bypass.  . Multiple sclerosis ([place]) 2015  . Orthostatic hypotension   . Syncope     Past Surgical History:  Procedure Laterality Date  . APPENDECTOMY    . CARDIAC CATHETERIZATION N/A 11/09/2014   Procedure: Coronary Angiography;  Surgeon:  Minna Merritts, MD;  Location: [place] [facility];  Service: Cardiovascular;  Laterality: N/A;  . CARDIAC CATHETERIZATION N/A 11/12/2014   Procedure: Coronary Stent Intervention;  Surgeon: Isaias Cowman, MD;  Location: [place] [facility];  Service: Cardiovascular;  Laterality: N/A;  . CARDIAC CATHETERIZATION N/A 04/18/2015   Procedure: Left Heart Cath and Coronary Angiography;  Surgeon: Minna Merritts, MD;  Location: [place] [facility];  Service: Cardiovascular;  Laterality: N/A;  . CARDIAC CATHETERIZATION Left 06/04/2015   Procedure: Left Heart Cath and Coronary Angiography;  Surgeon: Wellington Hampshire, MD;  Location: [place] [facility];  Service: Cardiovascular;  Laterality: Left;  . CARDIAC CATHETERIZATION N/A 06/04/2015   Procedure: Coronary Stent Intervention;  Surgeon: Wellington Hampshire, MD;  Location: [place] [facility];  Service: Cardiovascular;  Laterality: N/A;  . DILATION AND CURETTAGE OF UTERUS    . ESOPHAGOGASTRODUODENOSCOPY (EGD) WITH PROPOFOL N/A 09/14/2014   Procedure: ESOPHAGOGASTRODUODENOSCOPY (EGD) WITH PROPOFOL;  Surgeon: Josefine Class, MD;  Location: [facility] ENDOSCOPY;  Service: Endoscopy;  Laterality: N/A;  . GASTRIC BYPASS  09/2009   [facility]   . Left Carotid to sublcavian artery bypass w/ subclavian artery ligation     a. Performed @ [place].  . MELANOMA EXCISION  2016   Dr. Evorn Gong  . RIGHT  OOPHORECTOMY    . TRIGGER FINGER RELEASE    . TUBAL LIGATION     R tube removed still w/ Left     Allergies  No Known Allergies  History of Present Illness    45 year old female above complex past medical history including coronary artery disease, hypertension, hyperlipidemia, diabetes, stage III 4 chronic kidney disease, multiple strokes, and aortic arch aneurysm. Cardiac history dates back at least to 2005, at which time she underwent diagnostic catheterization revealing nonobstructive LAD disease. More recently, she underwent PCI and  stenting of the mid LAD in October 2016 with a drug-eluting stent. In March of this year, she underwent catheterization revealing moderate diagonal disease with patent LAD stent. She was initially medically managed but had recurrent chest pain in May 2017 and subsequently underwent drug-eluting stent placement to the first diagonal. She also has a history of ischemic cardiopathy with EF as low as 40-45% dating back to 2014 with subsequent normalization.  She says that she did well over the summer w/o chest pain, but then about two weeks ago, she started to experience constant retrosternal chest pain @ a minimum of 3/10, often worsening to 6/10, @ which point she'd take a ntg to knock it back down to 3/10.  Pain has been similar in character to prior angina but duration has been longer.  Over the past week, she has also noted DOE.  This AM, she was awakened by c/p @ 3 am and then vomited @ 3:30 am.  She took ntg with some relief but c/p recurred, prompting her to come into the ED.  Here, ECG is non-acute.  Trop is nl.  She continues to have c/p, which is worse with deep breathing and deep palpation over chest wall.  Inpatient Medications    . [START ON 10/29/2015] aspirin EC  81 mg Oral Daily  . [START ON 10/29/2015] citalopram  40 mg Oral Daily  . [START ON 10/29/2015] clopidogrel  75 mg Oral Daily  . [START ON 10/29/2015] diltiazem  120 mg Oral Daily  . gabapentin  300 mg Oral TID  . heparin  5,000 Units Subcutaneous Q8H  . [START ON 10/29/2015] Influenza vac split quadrivalent PF  0.5 mL Intramuscular Tomorrow-1000  . insulin aspart  0-5 Units Subcutaneous QHS  . insulin aspart  0-9 Units Subcutaneous TID WC  . [START ON 10/29/2015] isosorbide mononitrate  60 mg Oral Daily  . [START ON 10/29/2015] levothyroxine  25 mcg Oral QAC breakfast  . [START ON 10/29/2015] multivitamin with minerals  1 tablet Oral Daily  . pantoprazole  40 mg Oral BID  . rosuvastatin  40 mg Oral q1800  . sodium chloride flush  3  mL Intravenous Q12H  . traZODone  200 mg Oral QHS    Family History    Family History  Problem Relation Age of Onset  . Hypertension Mother   . Anxiety disorder Mother   . Depression Mother   . Bipolar disorder Mother   . Heart disease Mother   . Hyperlipidemia Mother   . Kidney disease Father   . Heart disease Father   . Hypertension Father   . Diabetes Father   . Stroke Father   . Colon cancer Father     dx in his 37's  . Anxiety disorder Father   . Depression Father   . Skin cancer Father   . Kidney disease Sister   . Thyroid nodules Sister   . Hypertension Sister   . Hypertension Sister   .  Diabetes Sister   . Hyperlipidemia Sister   . Depression Sister     Social History    Social History   Social History  . Marital status: Married    Spouse name: N/A  . Number of children: 1  . Years of education: N/A   Occupational History  . 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 Disabled through 08/02/2013 per forms 02/03/2013   Social History Main Topics  . Smoking status: Former Smoker    Types: Cigarettes    Quit date: 08/31/1994  . Smokeless tobacco: Never Used     Comment: quit 28 years ago  . Alcohol use No  . Drug use: No  . Sexual activity: Not Currently    Birth control/ protection: None   Other Topics Concern  . 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     Review of Systems    General:  No chills, fever, night sweats or weight changes.  Cardiovascular:  +++ chest pain, +++ dyspnea on exertion, no edema, orthopnea, palpitations, paroxysmal nocturnal dyspnea. Dermatological: No rash, lesions/masses Respiratory: No cough, +++ dyspnea Urologic: No hematuria, dysuria Abdominal:   No nausea, vomiting, diarrhea, bright red blood per rectum, melena, or hematemesis Neurologic:  No visual  changes, wkns, changes in mental status. All other systems reviewed and are otherwise negative except as noted above.  Physical Exam    Blood pressure (!) 131/57, pulse 74, temperature (!) 102 F (38.9 C), temperature source Oral, resp. rate 16, height 5\' 2"  (1.575 m), weight 179 lb 3.2 oz (81.3 kg), SpO2 100 %.  General: Pleasant, NAD Psych: Normal affect. Neuro: Alert and oriented X 3. Moves all extremities spontaneously. HEENT: Normal  Neck: Supple without bruits or JVD. Lungs:  Resp regular and unlabored, CTA. Heart: RRR no s3, s4, or murmurs. Abdomen: Soft, non-tender, non-distended, BS + x 4.  Extremities: No clubbing, cyanosis or edema. DP/PT/Radials 2+ and equal bilaterally.  Labs     Recent Labs  10/28/15 0859 10/28/15 1410  TROPONINI <0.03 <0.03   Lab Results  Component Value Date   WBC 16.1 (H) 10/28/2015   HGB 13.2 10/28/2015   HCT 38.2 10/28/2015   MCV 78.3 (L) 10/28/2015   PLT 160 10/28/2015    Recent Labs Lab 10/28/15 0859  NA 135  K 3.7  CL 101  CO2 22  BUN 33*  CREATININE 2.32*  CALCIUM 8.6*  PROT 7.4  BILITOT 0.8  ALKPHOS 84  ALT 17  AST 24  GLUCOSE 123*   Lab Results  Component Value Date   CHOL 164 11/09/2014   HDL 27 (L) 11/09/2014   LDLCALC 85 11/09/2014   TRIG 259 (H) 11/09/2014    Radiology Studies    Dg Chest 2 View  Result Date: 10/28/2015 CLINICAL DATA:  Chest pain. EXAM: CHEST  2 VIEW COMPARISON:  CT 05/31/2015. FINDINGS: Surgical clips upper chest. Right-sided aortic arch is noted. Heart size normal. Lungs are clear. No pleural effusion or pneumothorax. Degenerative changes thoracic spine. IMPRESSION: No acute cardiopulmonary disease. Right-sided aortic arch noted. Heart size normal. Electronically Signed   By: [place]   On: 10/28/2015 10:49    ECG & Cardiac Imaging    RSR, 92, PVC, LAE, non-specific ST/T changes.  No acute changes.  Assessment & Plan  1.  Unstable Angina/CAD:  Pt with a h/o CAD s/p LAD DES  in 11/2014 and D1 DES in 06/2015.  She did well over the summer but began to have recurrent, constant c/p beginning ~ 2 wks ago with associated dyspnea beginning ~ 1 wk ago.  Chest pain has been worse with deep breathing and palpation.  She awoke @ 3 am this morning with increased c/p and vomiting.  Pain has been minimally nitrate responsive.  In ED, ecg non-acute.  Echo already performed today showing nl EF w/o rwma.  Despite prolonged Ss (2 wks), trop is nl.  Doubt ACS. Continue to cycle CE.  In the absence of objective evidence of ischemia, would not pursue cath esp given CKD III -IV with acute worsening this admission (Creat 2.32).  Cont medical therapy including asa, plavix, nitrate, and statin.  2.  Hypertensive Heart Disease:  BP relatively stable on dilt/nitrate.  3.  HL:  Cont statin therapy.  LDL 85 when last check in 11/2014.  4.  DM II:  Per IM.  5.  CKD III-IV w/ AKI this admission:  Follow creat.  Avoid nephrotoxic agents.  6.  H/O ICM:  EF normalized.  Euvolemic on exam.    7.  Leukocytosis: ? Etiology.  Afebrile. No recent fevers/chills/localizing Ss.  Signed, Murray Hodgkins, NP 10/28/2015, 4:52 PM

## 2015-10-28 NOTE — Progress Notes (Signed)
DR Hollace Kinnier was informed about pt's temp of 102.f , see new order , tylenol given , will continue to monitor

## 2015-10-29 ENCOUNTER — Observation Stay: Payer: BC Managed Care – PPO

## 2015-10-29 DIAGNOSIS — N184 Chronic kidney disease, stage 4 (severe): Secondary | ICD-10-CM | POA: Diagnosis not present

## 2015-10-29 DIAGNOSIS — R61 Generalized hyperhidrosis: Secondary | ICD-10-CM | POA: Diagnosis not present

## 2015-10-29 DIAGNOSIS — N289 Disorder of kidney and ureter, unspecified: Secondary | ICD-10-CM | POA: Diagnosis not present

## 2015-10-29 DIAGNOSIS — I209 Angina pectoris, unspecified: Secondary | ICD-10-CM | POA: Diagnosis not present

## 2015-10-29 DIAGNOSIS — R112 Nausea with vomiting, unspecified: Secondary | ICD-10-CM

## 2015-10-29 DIAGNOSIS — I25111 Atherosclerotic heart disease of native coronary artery with angina pectoris with documented spasm: Secondary | ICD-10-CM | POA: Diagnosis not present

## 2015-10-29 DIAGNOSIS — R1110 Vomiting, unspecified: Secondary | ICD-10-CM

## 2015-10-29 LAB — URINALYSIS COMPLETE WITH MICROSCOPIC (ARMC ONLY)
BILIRUBIN URINE: NEGATIVE
KETONES UR: NEGATIVE mg/dL
NITRITE: POSITIVE — AB
PH: 5 (ref 5.0–8.0)
Protein, ur: NEGATIVE mg/dL
Specific Gravity, Urine: 1.008 (ref 1.005–1.030)

## 2015-10-29 LAB — CBC
HEMATOCRIT: 36 % (ref 35.0–47.0)
HEMOGLOBIN: 12.2 g/dL (ref 12.0–16.0)
MCH: 26.9 pg (ref 26.0–34.0)
MCHC: 33.9 g/dL (ref 32.0–36.0)
MCV: 79.4 fL — ABNORMAL LOW (ref 80.0–100.0)
Platelets: 151 10*3/uL (ref 150–440)
RBC: 4.54 MIL/uL (ref 3.80–5.20)
RDW: 14.5 % (ref 11.5–14.5)
WBC: 19.8 10*3/uL — AB (ref 3.6–11.0)

## 2015-10-29 LAB — BASIC METABOLIC PANEL
ANION GAP: 9 (ref 5–15)
BUN: 31 mg/dL — ABNORMAL HIGH (ref 6–20)
CO2: 21 mmol/L — ABNORMAL LOW (ref 22–32)
Calcium: 7.5 mg/dL — ABNORMAL LOW (ref 8.9–10.3)
Chloride: 106 mmol/L (ref 101–111)
Creatinine, Ser: 2.13 mg/dL — ABNORMAL HIGH (ref 0.44–1.00)
GFR calc Af Amer: 31 mL/min — ABNORMAL LOW (ref >60)
GFR, EST NON AFRICAN AMERICAN: 27 mL/min — AB (ref >60)
Glucose, Bld: 202 mg/dL — ABNORMAL HIGH (ref 65–99)
POTASSIUM: 3.2 mmol/L — AB (ref 3.5–5.1)
SODIUM: 136 mmol/L (ref 135–145)

## 2015-10-29 LAB — MAGNESIUM: Magnesium: 1.1 mg/dL — ABNORMAL LOW (ref 1.7–2.4)

## 2015-10-29 LAB — GLUCOSE, CAPILLARY
GLUCOSE-CAPILLARY: 172 mg/dL — AB (ref 65–99)
Glucose-Capillary: 176 mg/dL — ABNORMAL HIGH (ref 65–99)
Glucose-Capillary: 238 mg/dL — ABNORMAL HIGH (ref 65–99)
Glucose-Capillary: 250 mg/dL — ABNORMAL HIGH (ref 65–99)

## 2015-10-29 MED ORDER — POTASSIUM CHLORIDE CRYS ER 20 MEQ PO TBCR
40.0000 meq | EXTENDED_RELEASE_TABLET | Freq: Once | ORAL | Status: AC
  Administered 2015-10-29: 40 meq via ORAL
  Filled 2015-10-29: qty 2

## 2015-10-29 MED ORDER — ACETAMINOPHEN-CODEINE #3 300-30 MG PO TABS
1.0000 | ORAL_TABLET | Freq: Four times a day (QID) | ORAL | Status: DC | PRN
  Administered 2015-10-29: 1 via ORAL
  Filled 2015-10-29: qty 1

## 2015-10-29 MED ORDER — ENOXAPARIN SODIUM 40 MG/0.4ML ~~LOC~~ SOLN
40.0000 mg | SUBCUTANEOUS | Status: DC
  Administered 2015-10-29 – 2015-11-01 (×4): 40 mg via SUBCUTANEOUS
  Filled 2015-10-29 (×4): qty 0.4

## 2015-10-29 MED ORDER — DEXTROSE 5 % IV SOLN
1.0000 g | INTRAVENOUS | Status: DC
  Administered 2015-10-29 – 2015-11-01 (×4): 1 g via INTRAVENOUS
  Filled 2015-10-29 (×4): qty 10

## 2015-10-29 MED ORDER — ISOSORBIDE MONONITRATE ER 60 MG PO TB24
60.0000 mg | ORAL_TABLET | Freq: Every day | ORAL | Status: DC
  Administered 2015-10-30 – 2015-11-02 (×4): 60 mg via ORAL
  Filled 2015-10-29 (×4): qty 1

## 2015-10-29 MED ORDER — MAGNESIUM SULFATE 4 GM/100ML IV SOLN
4.0000 g | Freq: Once | INTRAVENOUS | Status: AC
  Administered 2015-10-29: 4 g via INTRAVENOUS
  Filled 2015-10-29: qty 100

## 2015-10-29 NOTE — Care Management (Signed)
Discussed inpatient status due to sepsis related to uti with attending and UR.  Elevated white count, temp, tachycardia, IV antibiotics and cultures pending

## 2015-10-29 NOTE — Progress Notes (Signed)
Patients BP soft this morning:  Vitals:   10/29/15 0529 10/29/15 0803  BP:  115/61  Pulse:  95  Resp:  20  Temp: 99.3 F (37.4 C) 98.4 F (36.9 C)   HR can be elevated at times as well. Dr. Bridgett Larsson paged and asked about AM PO cardizem and imdur. Per MD, hold Imdur but continue to give cardizem as scheduled. Will continue to monitor.

## 2015-10-29 NOTE — Progress Notes (Signed)
[facility]  ROUNDING NOTE   Subjective:  Patient still having some low-grade fevers. Renal function initially improved but creatinine back up to 2.1 with a BUN of 31 despite IV fluid hydration. Leukocytosis also noted.   Objective:  Vital signs in last 24 hours:  Temp:  [97.6 F (36.4 C)-102 F (38.9 C)] 100.3 F (37.9 C) (09/26 1442) Pulse Rate:  [69-117] 78 (09/26 1155) Resp:  [14-20] 15 (09/26 1155) BP: (103-131)/(55-63) 118/63 (09/26 1155) SpO2:  [93 %-100 %] 98 % (09/26 1155) Weight:  [81.3 kg (179 lb 3.2 oz)] 81.3 kg (179 lb 3.2 oz) (09/25 1602)  Weight change:  Filed Weights   10/28/15 0853 10/28/15 1602  Weight: 81.2 kg (179 lb) 81.3 kg (179 lb 3.2 oz)    Intake/Output: I/O last 3 completed shifts: In: 2743.8 [P.O.:480; I.V.:1033.8; IV Piggyback:1230] Out: 2400 [Urine:2400]   Intake/Output this shift:  Total I/O In: 770 [P.O.:240; I.V.:380; IV Piggyback:150] Out: 1700 [Urine:1700]  Physical Exam: General: No acute distress  Head: Normocephalic, atraumatic. Moist oral mucosal membranes  Eyes: Anicteric  Neck: Supple, trachea midline  Lungs:  Clear to auscultation, normal effort  Heart: S1S2 no rubs  Abdomen:  Soft, nontender, BS present  Extremities: No peripheral edema.  Neurologic: Nonfocal, moving all four extremities  Skin: No lesions  Access:     Basic Metabolic Panel:  Recent Labs Lab 10/28/15 0859 10/28/15 1712 10/29/15 0630  NA 135  --  136  K 3.7  --  3.2*  CL 101  --  106  CO2 22  --  21*  GLUCOSE 123*  --  202*  BUN 33*  --  31*  CREATININE 2.32* 1.96* 2.13*  CALCIUM 8.6*  --  7.5*  MG  --   --  1.1*    Liver Function Tests:  Recent Labs Lab 10/28/15 0859  AST 24  ALT 17  ALKPHOS 84  BILITOT 0.8  PROT 7.4  ALBUMIN 4.0   No results for input(s): LIPASE, AMYLASE in the last 168 hours. No results for input(s): AMMONIA in the last 168 hours.  CBC:  Recent Labs Lab 10/28/15 0859 10/28/15 1712  10/29/15 0630  WBC 16.1* 16.7* 19.8*  NEUTROABS 14.2*  --   --   HGB 13.2 12.5 12.2  HCT 38.2 36.3 36.0  MCV 78.3* 78.4* 79.4*  PLT 160 155 151    Cardiac Enzymes:  Recent Labs Lab 10/28/15 0859 10/28/15 1410  TROPONINI <0.03 <0.03    BNP: Invalid input(s): POCBNP  CBG:  Recent Labs Lab 10/28/15 1527 10/28/15 1652 10/28/15 2111 10/29/15 0722 10/29/15 1157  GLUCAP 98 108* 388* 176* 19*    Microbiology: Results for orders placed or performed during the hospital encounter of 10/28/15  CULTURE, BLOOD (ROUTINE X 2) w Reflex to ID Panel     Status: None (Preliminary result)   Collection Time: 10/28/15  5:12 PM  Result Value Ref Range Status   Specimen Description BLOOD RIGHT ARM  Final   Special Requests BOTTLES DRAWN AEROBIC AND ANAEROBIC 5CC  Final   Culture NO GROWTH < 24 HOURS  Final   Report Status PENDING  Incomplete  CULTURE, BLOOD (ROUTINE X 2) w Reflex to ID Panel     Status: None (Preliminary result)   Collection Time: 10/28/15  6:38 PM  Result Value Ref Range Status   Specimen Description BLOOD PERIPHERAL  Final   Special Requests BOTTLES DRAWN AEROBIC AND ANAEROBIC 5CC  Final   Culture NO GROWTH <  24 HOURS  Final   Report Status PENDING  Incomplete    Coagulation Studies:  Recent Labs  10/28/15 0859  LABPROT 14.2  INR 1.10    Urinalysis:  Recent Labs  10/28/15 1657  COLORURINE YELLOW*  LABSPEC 1.008  PHURINE 5.0  GLUCOSEU >500*  HGBUR 2+*  BILIRUBINUR NEGATIVE  KETONESUR NEGATIVE  PROTEINUR NEGATIVE  NITRITE POSITIVE*  LEUKOCYTESUR 1+*      Imaging: Dg Chest 2 View  Result Date: 10/28/2015 CLINICAL DATA:  Chest pain. EXAM: CHEST  2 VIEW COMPARISON:  CT 05/31/2015. FINDINGS: Surgical clips upper chest. Right-sided aortic arch is noted. Heart size normal. Lungs are clear. No pleural effusion or pneumothorax. Degenerative changes thoracic spine. IMPRESSION: No acute cardiopulmonary disease. Right-sided aortic arch noted. Heart size  normal. Electronically Signed   By: Marcello Moores  Register   On: 10/28/2015 10:49   US Renal  Result Date: 10/29/2015 CLINICAL DATA:  Acute renal failure, diabetes, hypertension EXAM: RENAL / URINARY TRACT ULTRASOUND COMPLETE COMPARISON:  CT abdomen pelvis 10/29/2012 FINDINGS: Right Kidney: Length: 10.0 cm. No hydronephrosis is seen. The echogenicity of the renal parenchyma is within normal limits. Left Kidney: Length: 10.5 cm. No hydronephrosis is noted. The echogenicity of the renal parenchyma is within normal limits. Bladder: The urinary bladder is unremarkable. IMPRESSION: Negative ultrasound of the kidneys.  No hydronephrosis. Electronically Signed   By: Ivar Drape M.D.   On: 10/29/2015 13:26     Medications:   . sodium chloride 100 mL/hr at 10/29/15 1204   . aspirin EC  81 mg Oral Daily  . cefTRIAXone (ROCEPHIN)  IV  1 g Intravenous Q24H  . citalopram  40 mg Oral Daily  . clopidogrel  75 mg Oral Daily  . diltiazem  120 mg Oral Daily  . enoxaparin (LOVENOX) injection  40 mg Subcutaneous Q24H  . gabapentin  300 mg Oral TID  . Influenza vac split quadrivalent PF  0.5 mL Intramuscular Tomorrow-1000  . insulin aspart  0-5 Units Subcutaneous QHS  . insulin aspart  0-9 Units Subcutaneous TID WC  . [START ON 10/30/2015] isosorbide mononitrate  60 mg Oral Daily  . levothyroxine  25 mcg Oral QAC breakfast  . multivitamin with minerals  1 tablet Oral Daily  . pantoprazole  40 mg Oral BID  . rosuvastatin  40 mg Oral q1800  . sodium chloride flush  3 mL Intravenous Q12H  . traZODone  200 mg Oral QHS   acetaminophen **OR** acetaminophen, acetaminophen-codeine, nitroGLYCERIN, ondansetron **OR** ondansetron (ZOFRAN) IV, zolpidem  Assessment/ Plan:  45 y.o. female [name] past medical history hypertension, diabetes mellitus for over 20 years, diabetic retinopathy, history of CVA, gastric ulcer, colonic polyps, hyperlipidemia, gastric bypass performed [facility], CVA with mild L  sided weakness 2015, carotild artery bypass and depression who returns today for of chronic kidney disease stage III.  1. Acute renal failure/Chronic kidney disease stage III/proteinuria.. Baseline creatinine 1.4 with EGFR 45. Renal ultrasound negative for obstruction.  BUN and creatinine both remain higher than baseline.  Continue IV fluid hydration for now.  2. Hypertension. Continue to hold losartan for now given acute renal failure.  3. Iron deficiency anemia.  This has actually improved. Hemoglobin currently 12.5. No indication for Procrit.  4.  Fever, possible sepsis.  The patient has been spiking fevers.  Changed to inpatient status.  UA suggestive of urinary tract infection.  The patient is on ceftriaxone at the moment.   LOS: 0 Moana Munford 9/26/20173:50 PM

## 2015-10-29 NOTE — Progress Notes (Signed)
Patient: [name] / Admit Date: 10/28/2015 / Date of Encounter: 10/29/2015, 9:53 AM   Subjective: Resting in bed comfortably this morning. No further chest pain. Fever noted to 102.4 in the afternoon of 9/25 and 100 this morning. UA pending. CXR non-acute. Blood cultures negative to date. WBC count up trending to 19.8 this morning from 16.7 on 9/25. Renal function slightly worse this morning at 2.13 from 1.96 at time of admission and 2.32 in the ED. On IV fluids.   Review of Systems: Review of Systems  Constitutional: Positive for chills, fever and malaise/fatigue. Negative for diaphoresis and weight loss.  HENT: Negative for congestion.   Eyes: Negative for discharge and redness.  Respiratory: Negative for cough, sputum production, shortness of breath and wheezing.   Cardiovascular: Negative for chest pain, palpitations, orthopnea, claudication, leg swelling and PND.  Gastrointestinal: Negative for abdominal pain, heartburn, nausea and vomiting.  Musculoskeletal: Positive for myalgias. Negative for falls.  Skin: Negative for rash.  Neurological: Positive for weakness. Negative for dizziness, tingling, tremors, sensory change, speech change, focal weakness and loss of consciousness.  Endo/Heme/Allergies: Does not bruise/bleed easily.  Psychiatric/Behavioral: Negative for substance abuse. The patient is not nervous/anxious.   All other systems reviewed and are negative.   Objective: Telemetry: NSR, 90's bpm Physical Exam: Blood pressure 115/61, pulse 95, temperature 98.4 F (36.9 C), temperature source Oral, resp. rate 20, height 5\' 2"  (1.575 m), weight 179 lb 3.2 oz (81.3 kg), SpO2 95 %. Body mass index is 32.78 kg/m. General: Well developed, well nourished, in no acute distress. Head: Normocephalic, atraumatic, sclera non-icteric, no xanthomas, nares are without discharge. Neck: Negative for carotid bruits. JVP not elevated. Lungs: Clear bilaterally to auscultation without  wheezes, rales, or rhonchi. Breathing is unlabored. Heart: RRR S1 S2 without murmurs, rubs, or gallops.  Abdomen: Soft, non-tender, non-distended with normoactive bowel sounds. No rebound/guarding. Extremities: No clubbing or cyanosis. No edema. Distal pedal pulses are 2+ and equal bilaterally. Neuro: Alert and oriented X 3. Moves all extremities spontaneously. Psych:  Responds to questions appropriately with a normal affect.   Intake/Output Summary (Last 24 hours) at 10/29/15 0953 Last data filed at 10/29/15 0801  Gross per 24 hour  Intake          1768.75 ml  Output             2900 ml  Net         -1131.25 ml    Inpatient Medications:  . aspirin EC  81 mg Oral Daily  . cefTRIAXone (ROCEPHIN)  IV  1 g Intravenous Q24H  . citalopram  40 mg Oral Daily  . clopidogrel  75 mg Oral Daily  . diltiazem  120 mg Oral Daily  . gabapentin  300 mg Oral TID  . heparin  5,000 Units Subcutaneous Q8H  . Influenza vac split quadrivalent PF  0.5 mL Intramuscular Tomorrow-1000  . insulin aspart  0-5 Units Subcutaneous QHS  . insulin aspart  0-9 Units Subcutaneous TID WC  . isosorbide mononitrate  60 mg Oral Daily  . levothyroxine  25 mcg Oral QAC breakfast  . multivitamin with minerals  1 tablet Oral Daily  . pantoprazole  40 mg Oral BID  . rosuvastatin  40 mg Oral q1800  . sodium chloride flush  3 mL Intravenous Q12H  . traZODone  200 mg Oral QHS   Infusions:  . sodium chloride 75 mL/hr at 10/29/15 0654    Labs:  Recent Labs  10/28/15 0859 10/28/15 1712 10/29/15 0630  NA 135  --  136  K 3.7  --  3.2*  CL 101  --  106  CO2 22  --  21*  GLUCOSE 123*  --  202*  BUN 33*  --  31*  CREATININE 2.32* 1.96* 2.13*  CALCIUM 8.6*  --  7.5*  MG  --   --  1.1*    Recent Labs  10/28/15 0859  AST 24  ALT 17  ALKPHOS 84  BILITOT 0.8  PROT 7.4  ALBUMIN 4.0    Recent Labs  10/28/15 0859 10/28/15 1712 10/29/15 0630  WBC 16.1* 16.7* 19.8*  NEUTROABS 14.2*  --   --   HGB 13.2 12.5  12.2  HCT 38.2 36.3 36.0  MCV 78.3* 78.4* 79.4*  PLT 160 155 151    Recent Labs  10/28/15 0859 10/28/15 1410  TROPONINI <0.03 <0.03   Invalid input(s): POCBNP No results for input(s): HGBA1C in the last 72 hours.   Weights: Filed Weights   10/28/15 0853 10/28/15 1602  Weight: 179 lb (81.2 kg) 179 lb 3.2 oz (81.3 kg)     Radiology/Studies:  Dg Chest 2 View  Result Date: 10/28/2015 CLINICAL DATA:  Chest pain. EXAM: CHEST  2 VIEW COMPARISON:  CT 05/31/2015. FINDINGS: Surgical clips upper chest. Right-sided aortic arch is noted. Heart size normal. Lungs are clear. No pleural effusion or pneumothorax. Degenerative changes thoracic spine. IMPRESSION: No acute cardiopulmonary disease. Right-sided aortic arch noted. Heart size normal. Electronically Signed   By: Marcello Moores  Register   On: 10/28/2015 10:49     Assessment and Plan  Active Problems:   Chest pain   CKD (chronic kidney disease), stage IV (HCC)   CAD (coronary artery disease)   Hypertensive heart disease    1. Unstable angina/CAD s/p PCI/DES to LAD in 11/2014 and D1 in 06/2015: -Symptoms with some typical and atypical features at this time -Perhaps the most concerning issue right now is her fever and renal function -Given negative troponins doubt ACS at this time -Would not pursue ischemic work up in light of her worsening renal function and she is now asymptomatic -Continue medical therapy with ASA, Plavix, nitrate and statin  2. Fever/leukocytosis: -Of uncertain etiology at this time -Cultures pending per IM -Defer work up to IM  3. Hypertensive heart disease: -Continue current medications  4. HLD: -Statin  5. Acute on CKD stage III-IV: -Renal on board -Avoid nephrotoxic agents  6. History of ICM: -Does not appear to be volume overloaded  Signed, Christell Faith, PA-C [facility] Pager: [phone] 10/29/2015, 9:53 AM

## 2015-10-29 NOTE — Progress Notes (Signed)
Per lab, ok to add on urinalysis order to specimen that is already collected and in lab (urine culture).

## 2015-10-29 NOTE — Progress Notes (Addendum)
Patient boarderline febrile again this afternoon, 100.3. Tylenol given, Dr. Bridgett Larsson made aware. No further orders. Will continue to monitor.

## 2015-10-29 NOTE — Progress Notes (Addendum)
[place] at [place] NAME: [name]    MR#:  [id number]  DATE OF BIRTH:  May 26, 1970  SUBJECTIVE:  CHIEF COMPLAINT:   Chief Complaint  Patient presents with  . Chest Pain   Fever and chills this am, BP was 103/55. HR was 117. Left flank pain, Chest tightness this am, but not now. REVIEW OF SYSTEMS:  Review of Systems  Constitutional: Positive for chills, fever and malaise/fatigue.  HENT: Negative for sore throat.   Eyes: Negative for blurred vision and double vision.  Respiratory: Negative for cough, shortness of breath, wheezing and stridor.   Cardiovascular: Positive for chest pain. Negative for palpitations and leg swelling.  Gastrointestinal: Positive for nausea. Negative for abdominal pain, blood in stool, melena and vomiting.  Genitourinary: Positive for flank pain and frequency. Negative for dysuria and urgency.  Musculoskeletal: Negative for joint pain.  Skin: Negative for itching and rash.  Neurological: Negative for dizziness, seizures, loss of consciousness and headaches.  Psychiatric/Behavioral: Negative for depression. The patient is not nervous/anxious.     DRUG ALLERGIES:  No Known Allergies VITALS:  Blood pressure 118/63, pulse 78, temperature 97.6 F (36.4 C), resp. rate 15, height 5\' 2"  (1.575 m), weight 179 lb 3.2 oz (81.3 kg), SpO2 98 %. PHYSICAL EXAMINATION:  Physical Exam  Constitutional: She is oriented to person, place, and time and well-developed, well-nourished, and in no distress. No distress.  HENT:  Head: Normocephalic.  Mouth/Throat: Oropharynx is clear and moist.  Eyes: Conjunctivae and EOM are normal. No scleral icterus.  Neck: Normal range of motion. Neck supple. No JVD present. No thyromegaly present.  Cardiovascular: Normal rate, regular rhythm and normal heart sounds.  Exam reveals no gallop.   No murmur heard. Pulmonary/Chest: Effort normal and breath sounds normal. No respiratory  distress. She has no wheezes. She has no rales.  Abdominal: Soft. Bowel sounds are normal. She exhibits no distension. There is no tenderness.  Musculoskeletal: Normal range of motion. She exhibits no edema or tenderness.  Neurological: She is alert and oriented to person, place, and time. No cranial nerve deficit.  Skin: Skin is warm and dry.  Psychiatric: Affect and judgment normal.   LABORATORY PANEL:   CBC  Recent Labs Lab 10/29/15 0630  WBC 19.8*  HGB 12.2  HCT 36.0  PLT 151   ------------------------------------------------------------------------------------------------------------------ Chemistries   Recent Labs Lab 10/28/15 0859  10/29/15 0630  NA 135  --  136  K 3.7  --  3.2*  CL 101  --  106  CO2 22  --  21*  GLUCOSE 123*  --  202*  BUN 33*  --  31*  CREATININE 2.32*  < > 2.13*  CALCIUM 8.6*  --  7.5*  MG  --   --  1.1*  AST 24  --   --   ALT 17  --   --   ALKPHOS 84  --   --   BILITOT 0.8  --   --   < > = values in this interval not displayed. RADIOLOGY:  No results found. ASSESSMENT AND PLAN:   45 year old female with past medical history of hypertension, hyperlipidemia, coronary disease status post stent placement, history of aortic arch aneurysm, diabetes imaging to the hospital due to chest pain.  1. Stable angina. coronary disease. Per Dr. [name], No plans at this time for cardiac intervention given stable symptoms, recent intervention several months ago, worsening renal function.  Continue aspirin,  Plavix, Imdur, Crestor, Cardizem.  2. Acute kidney injury on CKD stage 3.  Baseline creatinines are 1.5-1.6 currently elevated at 2.3. continue IV fluids, follow BUN and creatinine. -Hold losartan, Lasix for now. Renal US.  3. Diabetes type 2 without complication-hold metformin given the patient's acute kidney injury. Continue sliding scale.  4. GERD- cont.  Protonix.  5. Hypothyroidism-continue Synthroid.  6. Diabetic  neuropathy-continue lyrica, gabapentin.  7. Hyperlipidemia - cont. Crestor.    * Sepsis with UTI (presented on admission, fever, leukocytosis, tachycardia and hypotension), WBC up to 19.8. UA shows UTI. Start rocephin, f/u CBC, blood cultures and urine culture. Hold HTN meds due to low BP. Continue NS iv.  Hypokalemia. Given KCl. Hypomagnesemia. Given mag iv, f/u mag.  All the records are reviewed and case discussed with Dr. [name], RN, Care Management/Social Worker. Management plans discussed with the patient, family and they are in agreement.  CODE STATUS: full code.  TOTAL TIME TAKING CARE OF THIS PATIENT: 42 minutes.   More than 50% of the time was spent in counseling/coordination of care: YES  POSSIBLE D/C IN 2 DAYS, DEPENDING ON CLINICAL CONDITION.   Demetrios Loll M.D on 10/29/2015 at 1:10 PM  Between 7am to 6pm - Pager - [phone]  After 6pm go to [email or web address] - Proofreader  [facility]  Office  [phone]  CC: Primary care physician; [name], MD  Note: This dictation was prepared with Dragon dictation along with smaller phrase technology. Any transcriptional errors that result from this process are unintentional.

## 2015-10-29 NOTE — Progress Notes (Signed)
UA results back, positive for WBCs, leukocytes & nitrites. Results text paged to Dr. Bridgett Larsson, orders received.

## 2015-10-30 DIAGNOSIS — Z9049 Acquired absence of other specified parts of digestive tract: Secondary | ICD-10-CM | POA: Diagnosis not present

## 2015-10-30 DIAGNOSIS — Z9851 Tubal ligation status: Secondary | ICD-10-CM | POA: Diagnosis not present

## 2015-10-30 DIAGNOSIS — E039 Hypothyroidism, unspecified: Secondary | ICD-10-CM | POA: Diagnosis present

## 2015-10-30 DIAGNOSIS — A419 Sepsis, unspecified organism: Secondary | ICD-10-CM | POA: Diagnosis present

## 2015-10-30 DIAGNOSIS — N10 Acute tubulo-interstitial nephritis: Secondary | ICD-10-CM

## 2015-10-30 DIAGNOSIS — I130 Hypertensive heart and chronic kidney disease with heart failure and stage 1 through stage 4 chronic kidney disease, or unspecified chronic kidney disease: Secondary | ICD-10-CM | POA: Diagnosis present

## 2015-10-30 DIAGNOSIS — N289 Disorder of kidney and ureter, unspecified: Secondary | ICD-10-CM | POA: Diagnosis not present

## 2015-10-30 DIAGNOSIS — N184 Chronic kidney disease, stage 4 (severe): Secondary | ICD-10-CM | POA: Diagnosis present

## 2015-10-30 DIAGNOSIS — G35 Multiple sclerosis: Secondary | ICD-10-CM | POA: Diagnosis present

## 2015-10-30 DIAGNOSIS — I69354 Hemiplegia and hemiparesis following cerebral infarction affecting left non-dominant side: Secondary | ICD-10-CM | POA: Diagnosis not present

## 2015-10-30 DIAGNOSIS — Z85820 Personal history of malignant melanoma of skin: Secondary | ICD-10-CM | POA: Diagnosis not present

## 2015-10-30 DIAGNOSIS — I25110 Atherosclerotic heart disease of native coronary artery with unstable angina pectoris: Secondary | ICD-10-CM | POA: Diagnosis present

## 2015-10-30 DIAGNOSIS — N179 Acute kidney failure, unspecified: Secondary | ICD-10-CM | POA: Diagnosis present

## 2015-10-30 DIAGNOSIS — N12 Tubulo-interstitial nephritis, not specified as acute or chronic: Secondary | ICD-10-CM | POA: Diagnosis not present

## 2015-10-30 DIAGNOSIS — Z9884 Bariatric surgery status: Secondary | ICD-10-CM | POA: Diagnosis not present

## 2015-10-30 DIAGNOSIS — I5022 Chronic systolic (congestive) heart failure: Secondary | ICD-10-CM | POA: Diagnosis present

## 2015-10-30 DIAGNOSIS — I959 Hypotension, unspecified: Secondary | ICD-10-CM | POA: Diagnosis present

## 2015-10-30 DIAGNOSIS — R0602 Shortness of breath: Secondary | ICD-10-CM | POA: Diagnosis present

## 2015-10-30 DIAGNOSIS — K219 Gastro-esophageal reflux disease without esophagitis: Secondary | ICD-10-CM | POA: Diagnosis present

## 2015-10-30 DIAGNOSIS — I209 Angina pectoris, unspecified: Secondary | ICD-10-CM | POA: Diagnosis not present

## 2015-10-30 DIAGNOSIS — I255 Ischemic cardiomyopathy: Secondary | ICD-10-CM | POA: Diagnosis present

## 2015-10-30 DIAGNOSIS — E1122 Type 2 diabetes mellitus with diabetic chronic kidney disease: Secondary | ICD-10-CM | POA: Diagnosis present

## 2015-10-30 DIAGNOSIS — I25111 Atherosclerotic heart disease of native coronary artery with angina pectoris with documented spasm: Secondary | ICD-10-CM | POA: Diagnosis not present

## 2015-10-30 DIAGNOSIS — Z9889 Other specified postprocedural states: Secondary | ICD-10-CM | POA: Diagnosis not present

## 2015-10-30 DIAGNOSIS — Z955 Presence of coronary angioplasty implant and graft: Secondary | ICD-10-CM | POA: Diagnosis not present

## 2015-10-30 DIAGNOSIS — Z8711 Personal history of peptic ulcer disease: Secondary | ICD-10-CM | POA: Diagnosis not present

## 2015-10-30 DIAGNOSIS — Z86010 Personal history of colonic polyps: Secondary | ICD-10-CM | POA: Diagnosis not present

## 2015-10-30 DIAGNOSIS — N319 Neuromuscular dysfunction of bladder, unspecified: Secondary | ICD-10-CM | POA: Diagnosis not present

## 2015-10-30 DIAGNOSIS — N1330 Unspecified hydronephrosis: Secondary | ICD-10-CM | POA: Diagnosis not present

## 2015-10-30 LAB — GLUCOSE, CAPILLARY
GLUCOSE-CAPILLARY: 126 mg/dL — AB (ref 65–99)
GLUCOSE-CAPILLARY: 164 mg/dL — AB (ref 65–99)
GLUCOSE-CAPILLARY: 306 mg/dL — AB (ref 65–99)
Glucose-Capillary: 172 mg/dL — ABNORMAL HIGH (ref 65–99)

## 2015-10-30 LAB — MAGNESIUM: Magnesium: 2.2 mg/dL (ref 1.7–2.4)

## 2015-10-30 LAB — BASIC METABOLIC PANEL
ANION GAP: 3 — AB (ref 5–15)
BUN: 30 mg/dL — AB (ref 6–20)
CHLORIDE: 112 mmol/L — AB (ref 101–111)
CO2: 23 mmol/L (ref 22–32)
Calcium: 7.9 mg/dL — ABNORMAL LOW (ref 8.9–10.3)
Creatinine, Ser: 1.95 mg/dL — ABNORMAL HIGH (ref 0.44–1.00)
GFR calc Af Amer: 34 mL/min — ABNORMAL LOW (ref >60)
GFR calc non Af Amer: 30 mL/min — ABNORMAL LOW (ref >60)
GLUCOSE: 142 mg/dL — AB (ref 65–99)
POTASSIUM: 3.5 mmol/L (ref 3.5–5.1)
Sodium: 138 mmol/L (ref 135–145)

## 2015-10-30 LAB — CBC
HEMATOCRIT: 31.8 % — AB (ref 35.0–47.0)
HEMOGLOBIN: 11 g/dL — AB (ref 12.0–16.0)
MCH: 27.3 pg (ref 26.0–34.0)
MCHC: 34.7 g/dL (ref 32.0–36.0)
MCV: 78.6 fL — AB (ref 80.0–100.0)
Platelets: 122 10*3/uL — ABNORMAL LOW (ref 150–440)
RBC: 4.05 MIL/uL (ref 3.80–5.20)
RDW: 14.5 % (ref 11.5–14.5)
WBC: 9.8 10*3/uL (ref 3.6–11.0)

## 2015-10-30 MED ORDER — OXYCODONE HCL 5 MG PO TABS
5.0000 mg | ORAL_TABLET | Freq: Four times a day (QID) | ORAL | Status: DC | PRN
  Administered 2015-10-31 – 2015-11-01 (×6): 10 mg via ORAL
  Filled 2015-10-30 (×6): qty 2

## 2015-10-30 MED ORDER — OXYCODONE HCL 5 MG PO TABS
5.0000 mg | ORAL_TABLET | Freq: Four times a day (QID) | ORAL | Status: DC | PRN
  Administered 2015-10-30: 5 mg via ORAL
  Filled 2015-10-30: qty 1

## 2015-10-30 MED ORDER — MORPHINE SULFATE (PF) 2 MG/ML IV SOLN
1.0000 mg | INTRAVENOUS | Status: DC | PRN
  Administered 2015-10-30: 1 mg via INTRAVENOUS
  Filled 2015-10-30: qty 1

## 2015-10-30 MED ORDER — TRAMADOL HCL 50 MG PO TABS
50.0000 mg | ORAL_TABLET | Freq: Four times a day (QID) | ORAL | Status: DC | PRN
  Administered 2015-10-30: 50 mg via ORAL
  Filled 2015-10-30: qty 1

## 2015-10-30 MED ORDER — CIPROFLOXACIN HCL 250 MG PO TABS: 250.0000 mg | ORAL_TABLET | Freq: Two times a day (BID) | ORAL | 0 refills | Status: DC

## 2015-10-30 NOTE — Progress Notes (Signed)
[place] at [place] NAME: [name]    MR#:  [id number]  DATE OF BIRTH:  1970-05-14  SUBJECTIVE:  CHIEF COMPLAINT:   Chief Complaint  Patient presents with  . Chest Pain   Afebrile today. Complains of chronic back pain and on and off chest pain. REVIEW OF SYSTEMS:  Review of Systems  Constitutional: Positive for chills, fever and malaise/fatigue.  HENT: Negative for sore throat.   Eyes: Negative for blurred vision and double vision.  Respiratory: Negative for cough, shortness of breath, wheezing and stridor.   Cardiovascular: Positive for chest pain. Negative for palpitations and leg swelling.  Gastrointestinal: Positive for nausea. Negative for abdominal pain, blood in stool, melena and vomiting.  Genitourinary: Positive for flank pain and frequency. Negative for dysuria and urgency.  Musculoskeletal: Negative for joint pain.  Skin: Negative for itching and rash.  Neurological: Negative for dizziness, seizures, loss of consciousness and headaches.  Psychiatric/Behavioral: Negative for depression. The patient is not nervous/anxious.    DRUG ALLERGIES:  No Known Allergies VITALS:  Blood pressure 107/67, pulse 77, temperature 98.4 F (36.9 C), temperature source Oral, resp. rate 18, height 5\' 2"  (1.575 m), weight 81.3 kg (179 lb 3.2 oz), SpO2 100 %. PHYSICAL EXAMINATION:  Physical Exam  Constitutional: She is oriented to person, place, and time and well-developed, well-nourished, and in no distress. No distress.  HENT:  Head: Normocephalic.  Mouth/Throat: Oropharynx is clear and moist.  Eyes: Conjunctivae and EOM are normal. No scleral icterus.  Neck: Normal range of motion. Neck supple. No JVD present. No thyromegaly present.  Cardiovascular: Normal rate, regular rhythm and normal heart sounds.  Exam reveals no gallop.   No murmur heard. Pulmonary/Chest: Effort normal and breath sounds normal. No respiratory distress. She  has no wheezes. She has no rales.  Abdominal: Soft. Bowel sounds are normal. She exhibits no distension. There is no tenderness.  Musculoskeletal: Normal range of motion. She exhibits no edema or tenderness.  Neurological: She is alert and oriented to person, place, and time. No cranial nerve deficit.  Skin: Skin is warm and dry.  Psychiatric: Affect and judgment normal.   LABORATORY PANEL:   CBC  Recent Labs Lab 10/30/15 0400  WBC 9.8  HGB 11.0*  HCT 31.8*  PLT 122*   ------------------------------------------------------------------------------------------------------------------ Chemistries   Recent Labs Lab 10/28/15 0859  10/30/15 0400  NA 135  < > 138  K 3.7  < > 3.5  CL 101  < > 112*  CO2 22  < > 23  GLUCOSE 123*  < > 142*  BUN 33*  < > 30*  CREATININE 2.32*  < > 1.95*  CALCIUM 8.6*  < > 7.9*  MG  --   < > 2.2  AST 24  --   --   ALT 17  --   --   ALKPHOS 84  --   --   BILITOT 0.8  --   --   < > = values in this interval not displayed. RADIOLOGY:  US Renal  Result Date: 10/29/2015 CLINICAL DATA:  Acute renal failure, diabetes, hypertension EXAM: RENAL / URINARY TRACT ULTRASOUND COMPLETE COMPARISON:  CT abdomen pelvis 10/29/2012 FINDINGS: Right Kidney: Length: 10.0 cm. No hydronephrosis is seen. The echogenicity of the renal parenchyma is within normal limits. Left Kidney: Length: 10.5 cm. No hydronephrosis is noted. The echogenicity of the renal parenchyma is within normal limits. Bladder: The urinary bladder is unremarkable. IMPRESSION: Negative  ultrasound of the kidneys.  No hydronephrosis. Electronically Signed   By: Ivar Drape M.D.   On: 10/29/2015 13:26   ASSESSMENT AND PLAN:   45 year old female with past medical history of hypertension, hyperlipidemia, coronary disease status post stent placement, history of aortic arch aneurysm, diabetes imaging to the hospital due to chest pain.  * UTI with sepsis Ucx with GNR Continue IV abx. Change to PO per cx  results  * Stable angina. coronary disease. Per Dr. [name], No plans at this time for cardiac intervention given stable symptoms, recent intervention several months ago, worsening renal function.  Continue aspirin, Plavix, Imdur, Crestor, Cardizem.  * Acute kidney injury on CKD stage 3.  Baseline creatinines are 1.4 currently elevated at 2.3. continue IV fluids, follow BUN and creatinine. -Hold losartan, Lasix for now. Renal US - nothing acute. Discussed with Dr. Holley Raring. Likely discharge tomorrow if creatinine improving.  * Diabetes type 2 without complication-hold metformin given the patient's acute kidney injury. Continue sliding scale.  * Hypothyroidism-continue Synthroid.  *  Diabetic neuropathy-continue lyrica, gabapentin.   Hypokalemia. Given KCl. Hypomagnesemia. Given mag iv, f/u mag.  All the records are reviewed and case discussed with Dr. [name], RN, Care Management/Social Worker, Dr. Holley Raring. Management plans discussed with the patient, family and they are in agreement.  CODE STATUS: Full code.  TOTAL TIME TAKING CARE OF THIS PATIENT: 30 minutes.   POSSIBLE D/C IN 2 DAYS, DEPENDING ON CLINICAL CONDITION.  Hillary Bow R M.D on 10/30/2015 at 1:05 PM  Between 7am to 6pm - Pager - [phone]  After 6pm go to [email or web address] - Proofreader  [facility]  Office  [phone]  CC: Primary care physician; [name], MD  Note: This dictation was prepared with Dragon dictation along with smaller phrase technology. Any transcriptional errors that result from this process are unintentional.

## 2015-10-30 NOTE — Progress Notes (Signed)
[facility]  ROUNDING NOTE   Subjective:  Patient appears to be improving with treatment of underlying infection. Creatinine continues to fluctuate over the past several days. Currently creatinine is 1.95.    Objective:  Vital signs in last 24 hours:  Temp:  [97.5 F (36.4 C)-102 F (38.9 C)] 98.4 F (36.9 C) (09/27 1117) Pulse Rate:  [58-79] 77 (09/27 1117) Resp:  [16-18] 18 (09/27 1117) BP: (92-113)/(40-67) 107/67 (09/27 1117) SpO2:  [95 %-100 %] 100 % (09/27 1117)  Weight change:  Filed Weights   10/28/15 0853 10/28/15 1602  Weight: 81.2 kg (179 lb) 81.3 kg (179 lb 3.2 oz)    Intake/Output: I/O last 3 completed shifts: In: 3638.3 [P.O.:240; I.V.:3248.3; IV Piggyback:150] Out: 5400 [Urine:5400]   Intake/Output this shift:  Total I/O In: 480 [P.O.:480] Out: 900 [Urine:900]  Physical Exam: General: No acute distress  Head: Normocephalic, atraumatic. Moist oral mucosal membranes  Eyes: Anicteric  Neck: Supple, trachea midline  Lungs:  Clear to auscultation, normal effort  Heart: S1S2 no rubs  Abdomen:  Soft, nontender, BS present  Extremities: No peripheral edema.  Neurologic: Nonfocal, moving all four extremities  Skin: No lesions  Access:     Basic Metabolic Panel:  Recent Labs Lab 10/28/15 0859 10/28/15 1712 10/29/15 0630 10/30/15 0400  NA 135  --  136 138  K 3.7  --  3.2* 3.5  CL 101  --  106 112*  CO2 22  --  21* 23  GLUCOSE 123*  --  202* 142*  BUN 33*  --  31* 30*  CREATININE 2.32* 1.96* 2.13* 1.95*  CALCIUM 8.6*  --  7.5* 7.9*  MG  --   --  1.1* 2.2    Liver Function Tests:  Recent Labs Lab 10/28/15 0859  AST 24  ALT 17  ALKPHOS 84  BILITOT 0.8  PROT 7.4  ALBUMIN 4.0   No results for input(s): LIPASE, AMYLASE in the last 168 hours. No results for input(s): AMMONIA in the last 168 hours.  CBC:  Recent Labs Lab 10/28/15 0859 10/28/15 1712 10/29/15 0630 10/30/15 0400  WBC 16.1* 16.7* 19.8* 9.8  NEUTROABS  14.2*  --   --   --   HGB 13.2 12.5 12.2 11.0*  HCT 38.2 36.3 36.0 31.8*  MCV 78.3* 78.4* 79.4* 78.6*  PLT 160 155 151 122*    Cardiac Enzymes:  Recent Labs Lab 10/28/15 0859 10/28/15 1410  TROPONINI <0.03 <0.03    BNP: Invalid input(s): POCBNP  CBG:  Recent Labs Lab 10/29/15 1157 10/29/15 1650 10/29/15 2032 10/30/15 0818 10/30/15 1119  GLUCAP 238* 172* 250* 172* 126*    Microbiology: Results for orders placed or performed during the hospital encounter of 10/28/15  Urine culture     Status: Abnormal (Preliminary result)   Collection Time: 10/28/15  4:30 PM  Result Value Ref Range Status   Specimen Description URINE, CLEAN CATCH  Final   Special Requests Normal  Final   Culture (A)  Final    >=100,000 COLONIES/mL KLEBSIELLA PNEUMONIAE SUSCEPTIBILITIES TO FOLLOW Performed at [facility]    Report Status PENDING  Incomplete  CULTURE, BLOOD (ROUTINE X 2) w Reflex to ID Panel     Status: None (Preliminary result)   Collection Time: 10/28/15  5:12 PM  Result Value Ref Range Status   Specimen Description BLOOD RIGHT ARM  Final   Special Requests BOTTLES DRAWN AEROBIC AND ANAEROBIC 5CC  Final   Culture NO GROWTH 2 DAYS  Final   Report Status PENDING  Incomplete  CULTURE, BLOOD (ROUTINE X 2) w Reflex to ID Panel     Status: None (Preliminary result)   Collection Time: 10/28/15  6:38 PM  Result Value Ref Range Status   Specimen Description BLOOD PERIPHERAL  Final   Special Requests BOTTLES DRAWN AEROBIC AND ANAEROBIC 5CC  Final   Culture NO GROWTH 2 DAYS  Final   Report Status PENDING  Incomplete    Coagulation Studies:  Recent Labs  10/28/15 0859  LABPROT 14.2  INR 1.10    Urinalysis:  Recent Labs  10/28/15 1657  COLORURINE YELLOW*  LABSPEC 1.008  PHURINE 5.0  GLUCOSEU >500*  HGBUR 2+*  BILIRUBINUR NEGATIVE  KETONESUR NEGATIVE  PROTEINUR NEGATIVE  NITRITE POSITIVE*  LEUKOCYTESUR 1+*      Imaging: US Renal  Result Date:  10/29/2015 CLINICAL DATA:  Acute renal failure, diabetes, hypertension EXAM: RENAL / URINARY TRACT ULTRASOUND COMPLETE COMPARISON:  CT abdomen pelvis 10/29/2012 FINDINGS: Right Kidney: Length: 10.0 cm. No hydronephrosis is seen. The echogenicity of the renal parenchyma is within normal limits. Left Kidney: Length: 10.5 cm. No hydronephrosis is noted. The echogenicity of the renal parenchyma is within normal limits. Bladder: The urinary bladder is unremarkable. IMPRESSION: Negative ultrasound of the kidneys.  No hydronephrosis. Electronically Signed   By: Ivar Drape M.D.   On: 10/29/2015 13:26     Medications:     . aspirin EC  81 mg Oral Daily  . cefTRIAXone (ROCEPHIN)  IV  1 g Intravenous Q24H  . citalopram  40 mg Oral Daily  . clopidogrel  75 mg Oral Daily  . diltiazem  120 mg Oral Daily  . enoxaparin (LOVENOX) injection  40 mg Subcutaneous Q24H  . gabapentin  300 mg Oral TID  . Influenza vac split quadrivalent PF  0.5 mL Intramuscular Tomorrow-1000  . insulin aspart  0-5 Units Subcutaneous QHS  . insulin aspart  0-9 Units Subcutaneous TID WC  . isosorbide mononitrate  60 mg Oral Daily  . levothyroxine  25 mcg Oral QAC breakfast  . multivitamin with minerals  1 tablet Oral Daily  . pantoprazole  40 mg Oral BID  . rosuvastatin  40 mg Oral q1800  . sodium chloride flush  3 mL Intravenous Q12H  . traZODone  200 mg Oral QHS   acetaminophen **OR** acetaminophen, acetaminophen-codeine, nitroGLYCERIN, ondansetron **OR** ondansetron (ZOFRAN) IV, zolpidem  Assessment/ Plan:  45 y.o. female [name] past medical history hypertension, diabetes mellitus for over 20 years, diabetic retinopathy, history of CVA, gastric ulcer, colonic polyps, hyperlipidemia, gastric bypass performed [facility], CVA with mild L sided weakness 2015, carotild artery bypass and depression who returns today for of chronic kidney disease stage III.  1. Acute renal failure/Chronic kidney disease stage  III/proteinuria.. Baseline creatinine 1.4 with EGFR 45. Renal ultrasound negative for obstruction.   -  Patient taken off of IV fluid hydration. This is reasonable as she is received IV fluids for the past 2 days.  Continue to monitor renal function daily for now.  2. Hypertension. Blood pressure currently 107/67.  Continue diltiazem for now.  3. Iron deficiency anemia.  Hemoglobin currently down to 11.  No indication for Procrit at the moment.  4.  Fever, possible sepsis.  The patient has been spiking fevers.  Changed to inpatient status.  UA suggestive of urinary tract infection.  Klebsiella noted in the urine.  Patient is currently on ceftriaxone.   LOS: 0 [name] 9/27/20173:01 PM

## 2015-10-30 NOTE — Progress Notes (Signed)
Patient: [name] [name] / Admit Date: 10/28/2015 / Date of Encounter: 10/30/2015, 8:21 AM   Subjective: No acute overnight events. No chest pain. Renal function improving. UA c/w UTI. Tmax 102 in the afternoon of 9/26.   Review of Systems: Review of Systems  Constitutional: Positive for chills, fever and malaise/fatigue. Negative for diaphoresis and weight loss.  HENT: Negative for congestion.   Eyes: Negative for discharge and redness.  Respiratory: Negative for cough, sputum production, shortness of breath and wheezing.   Cardiovascular: Negative for chest pain, palpitations, orthopnea, claudication, leg swelling and PND.  Gastrointestinal: Negative for abdominal pain, heartburn, nausea and vomiting.  Musculoskeletal: Positive for myalgias. Negative for falls.  Skin: Negative for rash.  Neurological: Positive for weakness. Negative for dizziness, tingling, tremors, sensory change, speech change, focal weakness and loss of consciousness.  Endo/Heme/Allergies: Does not bruise/bleed easily.  Psychiatric/Behavioral: Negative for substance abuse. The patient is not nervous/anxious.     Objective: Telemetry: NSR, 90's bpm Physical Exam: Blood pressure 112/60, pulse (!) 58, temperature 97.5 F (36.4 C), temperature source Oral, resp. rate 18, height 5\' 2"  (1.575 m), weight 179 lb 3.2 oz (81.3 kg), SpO2 96 %. Body mass index is 32.78 kg/m. General: Well developed, well nourished, in no acute distress. Head: Normocephalic, atraumatic, sclera non-icteric, no xanthomas, nares are without discharge. Neck: Negative for carotid bruits. JVP not elevated. Lungs: Clear bilaterally to auscultation without wheezes, rales, or rhonchi. Breathing is unlabored. Heart: RRR S1 S2 without murmurs, rubs, or gallops.  Abdomen: Soft, non-tender, non-distended with normoactive bowel sounds. No rebound/guarding. Extremities: No clubbing or cyanosis. No edema. Distal pedal pulses are 2+ and equal  bilaterally. Neuro: Alert and oriented X 3. Moves all extremities spontaneously. Psych:  Responds to questions appropriately with a normal affect.   Intake/Output Summary (Last 24 hours) at 10/30/15 0821 Last data filed at 10/30/15 0700  Gross per 24 hour  Intake          2663.33 ml  Output             2800 ml  Net          -136.67 ml    Inpatient Medications:  . aspirin EC  81 mg Oral Daily  . cefTRIAXone (ROCEPHIN)  IV  1 g Intravenous Q24H  . citalopram  40 mg Oral Daily  . clopidogrel  75 mg Oral Daily  . diltiazem  120 mg Oral Daily  . enoxaparin (LOVENOX) injection  40 mg Subcutaneous Q24H  . gabapentin  300 mg Oral TID  . Influenza vac split quadrivalent PF  0.5 mL Intramuscular Tomorrow-1000  . insulin aspart  0-5 Units Subcutaneous QHS  . insulin aspart  0-9 Units Subcutaneous TID WC  . isosorbide mononitrate  60 mg Oral Daily  . levothyroxine  25 mcg Oral QAC breakfast  . multivitamin with minerals  1 tablet Oral Daily  . pantoprazole  40 mg Oral BID  . rosuvastatin  40 mg Oral q1800  . sodium chloride flush  3 mL Intravenous Q12H  . traZODone  200 mg Oral QHS   Infusions:  . sodium chloride 100 mL/hr at 10/29/15 2113    Labs:  Recent Labs  10/29/15 0630 10/30/15 0400  NA 136 138  K 3.2* 3.5  CL 106 112*  CO2 21* 23  GLUCOSE 202* 142*  BUN 31* 30*  CREATININE 2.13* 1.95*  CALCIUM 7.5* 7.9*  MG 1.1* 2.2    Recent Labs  10/28/15 0859  AST 24  ALT 17  ALKPHOS 84  BILITOT 0.8  PROT 7.4  ALBUMIN 4.0    Recent Labs  10/28/15 0859  10/29/15 0630 10/30/15 0400  WBC 16.1*  < > 19.8* 9.8  NEUTROABS 14.2*  --   --   --   HGB 13.2  < > 12.2 11.0*  HCT 38.2  < > 36.0 31.8*  MCV 78.3*  < > 79.4* 78.6*  PLT 160  < > 151 122*  < > = values in this interval not displayed.  Recent Labs  10/28/15 0859 10/28/15 1410  TROPONINI <0.03 <0.03   Invalid input(s): POCBNP No results for input(s): HGBA1C in the last 72 hours.   Weights: Filed  Weights   10/28/15 0853 10/28/15 1602  Weight: 179 lb (81.2 kg) 179 lb 3.2 oz (81.3 kg)     Radiology/Studies:  Dg Chest 2 View  Result Date: 10/28/2015 CLINICAL DATA:  Chest pain. EXAM: CHEST  2 VIEW COMPARISON:  CT 05/31/2015. FINDINGS: Surgical clips upper chest. Right-sided aortic arch is noted. Heart size normal. Lungs are clear. No pleural effusion or pneumothorax. Degenerative changes thoracic spine. IMPRESSION: No acute cardiopulmonary disease. Right-sided aortic arch noted. Heart size normal. Electronically Signed   By: Marcello Moores  Register   On: 10/28/2015 10:49   US Renal  Result Date: 10/29/2015 CLINICAL DATA:  Acute renal failure, diabetes, hypertension EXAM: RENAL / URINARY TRACT ULTRASOUND COMPLETE COMPARISON:  CT abdomen pelvis 10/29/2012 FINDINGS: Right Kidney: Length: 10.0 cm. No hydronephrosis is seen. The echogenicity of the renal parenchyma is within normal limits. Left Kidney: Length: 10.5 cm. No hydronephrosis is noted. The echogenicity of the renal parenchyma is within normal limits. Bladder: The urinary bladder is unremarkable. IMPRESSION: Negative ultrasound of the kidneys.  No hydronephrosis. Electronically Signed   By: Ivar Drape M.D.   On: 10/29/2015 13:26     Assessment and Plan  Active Problems:   Chest pain   CKD (chronic kidney disease), stage IV (HCC)   CAD (coronary artery disease)   Hypertensive heart disease   Acute renal insufficiency   Diaphoresis   Emesis    1. Stable angina/CAD s/p PCI/DES to LAD in 11/2014 and D1 in 06/2015: -Symptoms with some typical and atypical features at this time -Perhaps the most concerning issue right now is her UTI, fever and renal function -Given negative troponins doubt ACS at this time -Would not pursue ischemic work up in light of her worsening renal function and she is now asymptomatic -Continue medical therapy with ASA, Plavix, nitrate and statin  2. UTI/fever/leukocytosis: -On Rocephin per IM -Blood cultures  negative x 2 to date  3. Hypertensive heart disease: -Continue current medications  4. HLD: -Statin  5. Acute on CKD stage III-IV: -Improving with hydration  -Renal on board -Avoid nephrotoxic agents  6. History of ICM: -Does not appear to be volume overloaded   Signed, Christell Faith, PA-C [place] Pager: [phone] 10/30/2015, 8:21 AM

## 2015-10-30 NOTE — Discharge Instructions (Signed)
Resumed diet and activity as before °

## 2015-10-30 NOTE — Care Management (Signed)
Patient wanted prayer for family members. We talked for awhile and prayed together. [name], although sick is recovering and in good spirits and hope that things will improve.

## 2015-10-31 ENCOUNTER — Inpatient Hospital Stay: Payer: BC Managed Care – PPO

## 2015-10-31 LAB — CBC
HEMATOCRIT: 32.5 % — AB (ref 35.0–47.0)
Hemoglobin: 11 g/dL — ABNORMAL LOW (ref 12.0–16.0)
MCH: 26.9 pg (ref 26.0–34.0)
MCHC: 33.9 g/dL (ref 32.0–36.0)
MCV: 79.4 fL — ABNORMAL LOW (ref 80.0–100.0)
Platelets: 138 10*3/uL — ABNORMAL LOW (ref 150–440)
RBC: 4.1 MIL/uL (ref 3.80–5.20)
RDW: 14.9 % — ABNORMAL HIGH (ref 11.5–14.5)
WBC: 8.5 10*3/uL (ref 3.6–11.0)

## 2015-10-31 LAB — GLUCOSE, CAPILLARY
GLUCOSE-CAPILLARY: 185 mg/dL — AB (ref 65–99)
GLUCOSE-CAPILLARY: 139 mg/dL — AB (ref 65–99)
Glucose-Capillary: 141 mg/dL — ABNORMAL HIGH (ref 65–99)
Glucose-Capillary: 335 mg/dL — ABNORMAL HIGH (ref 65–99)

## 2015-10-31 LAB — BASIC METABOLIC PANEL
Anion gap: 6 (ref 5–15)
BUN: 22 mg/dL — ABNORMAL HIGH (ref 6–20)
CALCIUM: 7.9 mg/dL — AB (ref 8.9–10.3)
CHLORIDE: 108 mmol/L (ref 101–111)
CO2: 21 mmol/L — AB (ref 22–32)
CREATININE: 1.84 mg/dL — AB (ref 0.44–1.00)
GFR calc non Af Amer: 32 mL/min — ABNORMAL LOW (ref >60)
GFR, EST AFRICAN AMERICAN: 37 mL/min — AB (ref >60)
GLUCOSE: 138 mg/dL — AB (ref 65–99)
Potassium: 3.9 mmol/L (ref 3.5–5.1)
Sodium: 135 mmol/L (ref 135–145)

## 2015-10-31 LAB — URINE CULTURE: Special Requests: NORMAL

## 2015-10-31 MED ORDER — LIDOCAINE 5 % EX OINT
TOPICAL_OINTMENT | Freq: Two times a day (BID) | CUTANEOUS | Status: DC | PRN
  Filled 2015-10-31: qty 35.44

## 2015-10-31 MED ORDER — MAGIC MOUTHWASH
10.0000 mL | Freq: Three times a day (TID) | ORAL | Status: DC
  Administered 2015-10-31 – 2015-11-02 (×6): 10 mL via ORAL
  Filled 2015-10-31 (×6): qty 10

## 2015-10-31 NOTE — Progress Notes (Signed)
Inpatient Diabetes Program Recommendations  AACE/ADA: New Consensus Statement on Inpatient Glycemic Control (2015)  Target Ranges:  Prepandial:   less than 140 mg/dL      Peak postprandial:   less than 180 mg/dL (1-2 hours)      Critically ill patients:  140 - 180 mg/dL  Results for [name], [name] (MRN [id number]) as of 10/31/2015 08:39  Ref. Range 10/30/2015 08:18 10/30/2015 11:19 10/30/2015 16:12 10/30/2015 21:04 10/31/2015 07:21  Glucose-Capillary Latest Ref Range: 65 - 99 mg/dL 172 (H) 126 (H) 164 (H) 306 (H) 141 (H)   Review of Glycemic Control  Diabetes history: DM2 Outpatient Diabetes medications: Jardiance 25 mg daily, Metformin 1000 mg BID Current orders for Inpatient glycemic control: Novolog 0-9 units TID with meals, Novolog 0-5 units QHS  Inpatient Diabetes Program Recommendations: Insulin - Meal Coverage: While inpatient, please consider ordering Novolog 2 units TID with meals for meal coverage (in addition to Novolog correction) if patient eats at least 50% of meals.  Thanks, Barnie Alderman, RN, MSN, CDE Diabetes Coordinator Inpatient Diabetes Program [phone] (Team Pager from [place] to [place]) [phone] ([place] office) [phone] [facility] office) [phone] [facility] office)

## 2015-10-31 NOTE — Progress Notes (Signed)
[facility]  ROUNDING NOTE   Subjective:  Pt seen at bedside.  Cr down to 1.8. Complaning of left flank pain.  Good UOP noted.   Discussed obtaining CT scan abd/pelvis given L flank pain.   Objective:  Vital signs in last 24 hours:  Temp:  [98.2 F (36.8 C)-101.5 F (38.6 C)] 98.2 F (36.8 C) (09/28 0816) Pulse Rate:  [77-109] 82 (09/28 0816) Resp:  [18] 18 (09/28 0816) BP: (96-146)/(60-73) 129/73 (09/28 0816) SpO2:  [92 %-100 %] 94 % (09/28 0816)  Weight change:  Filed Weights   10/28/15 0853 10/28/15 1602  Weight: 81.2 kg (179 lb) 81.3 kg (179 lb 3.2 oz)    Intake/Output: I/O last 3 completed shifts: In: 1830 [P.O.:480; I.V.:1300; IV Piggyback:50] Out: 4300 [Urine:4300]   Intake/Output this shift:  Total I/O In: 240 [P.O.:240] Out: -   Physical Exam: General: No acute distress  Head: Normocephalic, atraumatic. Moist oral mucosal membranes  Eyes: Anicteric  Neck: Supple, trachea midline  Lungs:  Clear to auscultation, normal effort  Heart: S1S2 no rubs  Abdomen:  Soft, mild left flank tenderness  Extremities: No peripheral edema.  Neurologic: Nonfocal, moving all four extremities  Skin: No lesions  Access:     Basic Metabolic Panel:  Recent Labs Lab 10/28/15 0859 10/28/15 1712 10/29/15 0630 10/30/15 0400 10/31/15 0325  NA 135  --  136 138 135  K 3.7  --  3.2* 3.5 3.9  CL 101  --  106 112* 108  CO2 22  --  21* 23 21*  GLUCOSE 123*  --  202* 142* 138*  BUN 33*  --  31* 30* 22*  CREATININE 2.32* 1.96* 2.13* 1.95* 1.84*  CALCIUM 8.6*  --  7.5* 7.9* 7.9*  MG  --   --  1.1* 2.2  --     Liver Function Tests:  Recent Labs Lab 10/28/15 0859  AST 24  ALT 17  ALKPHOS 84  BILITOT 0.8  PROT 7.4  ALBUMIN 4.0   No results for input(s): LIPASE, AMYLASE in the last 168 hours. No results for input(s): AMMONIA in the last 168 hours.  CBC:  Recent Labs Lab 10/28/15 0859 10/28/15 1712 10/29/15 0630 10/30/15 0400 10/31/15 0325   WBC 16.1* 16.7* 19.8* 9.8 8.5  NEUTROABS 14.2*  --   --   --   --   HGB 13.2 12.5 12.2 11.0* 11.0*  HCT 38.2 36.3 36.0 31.8* 32.5*  MCV 78.3* 78.4* 79.4* 78.6* 79.4*  PLT 160 155 151 122* 138*    Cardiac Enzymes:  Recent Labs Lab 10/28/15 0859 10/28/15 1410  TROPONINI <0.03 <0.03    BNP: Invalid input(s): POCBNP  CBG:  Recent Labs Lab 10/30/15 0818 10/30/15 1119 10/30/15 1612 10/30/15 2104 10/31/15 0721  GLUCAP 172* 126* 164* 306* 141*    Microbiology: Results for orders placed or performed during the hospital encounter of 10/28/15  Urine culture     Status: Abnormal   Collection Time: 10/28/15  4:30 PM  Result Value Ref Range Status   Specimen Description URINE, CLEAN CATCH  Final   Special Requests Normal  Final   Culture >=100,000 COLONIES/mL KLEBSIELLA PNEUMONIAE (A)  Final   Report Status 10/31/2015 FINAL  Final   Organism ID, Bacteria KLEBSIELLA PNEUMONIAE (A)  Final      Susceptibility   Klebsiella pneumoniae - MIC*    AMPICILLIN >=32 RESISTANT Resistant     CEFAZOLIN <=4 SENSITIVE Sensitive     CEFTRIAXONE <=1 SENSITIVE Sensitive  CIPROFLOXACIN <=0.25 SENSITIVE Sensitive     GENTAMICIN <=1 SENSITIVE Sensitive     IMIPENEM <=0.25 SENSITIVE Sensitive     NITROFURANTOIN 64 INTERMEDIATE Intermediate     TRIMETH/SULFA >=320 RESISTANT Resistant     AMPICILLIN/SULBACTAM 4 SENSITIVE Sensitive     PIP/TAZO <=4 SENSITIVE Sensitive     Extended ESBL NEGATIVE Sensitive     * >=100,000 COLONIES/mL KLEBSIELLA PNEUMONIAE  CULTURE, BLOOD (ROUTINE X 2) w Reflex to ID Panel     Status: None (Preliminary result)   Collection Time: 10/28/15  5:12 PM  Result Value Ref Range Status   Specimen Description BLOOD RIGHT ARM  Final   Special Requests BOTTLES DRAWN AEROBIC AND ANAEROBIC 5CC  Final   Culture NO GROWTH 3 DAYS  Final   Report Status PENDING  Incomplete  CULTURE, BLOOD (ROUTINE X 2) w Reflex to ID Panel     Status: None (Preliminary result)   Collection  Time: 10/28/15  6:38 PM  Result Value Ref Range Status   Specimen Description BLOOD PERIPHERAL  Final   Special Requests BOTTLES DRAWN AEROBIC AND ANAEROBIC 5CC  Final   Culture NO GROWTH 3 DAYS  Final   Report Status PENDING  Incomplete    Coagulation Studies: No results for input(s): LABPROT, INR in the last 72 hours.  Urinalysis:  Recent Labs  10/28/15 1657  COLORURINE YELLOW*  LABSPEC 1.008  PHURINE 5.0  GLUCOSEU >500*  HGBUR 2+*  BILIRUBINUR NEGATIVE  KETONESUR NEGATIVE  PROTEINUR NEGATIVE  NITRITE POSITIVE*  LEUKOCYTESUR 1+*      Imaging: US Renal  Result Date: 10/29/2015 CLINICAL DATA:  Acute renal failure, diabetes, hypertension EXAM: RENAL / URINARY TRACT ULTRASOUND COMPLETE COMPARISON:  CT abdomen pelvis 10/29/2012 FINDINGS: Right Kidney: Length: 10.0 cm. No hydronephrosis is seen. The echogenicity of the renal parenchyma is within normal limits. Left Kidney: Length: 10.5 cm. No hydronephrosis is noted. The echogenicity of the renal parenchyma is within normal limits. Bladder: The urinary bladder is unremarkable. IMPRESSION: Negative ultrasound of the kidneys.  No hydronephrosis. Electronically Signed   By: Ivar Drape M.D.   On: 10/29/2015 13:26     Medications:     . aspirin EC  81 mg Oral Daily  . cefTRIAXone (ROCEPHIN)  IV  1 g Intravenous Q24H  . citalopram  40 mg Oral Daily  . clopidogrel  75 mg Oral Daily  . diltiazem  120 mg Oral Daily  . enoxaparin (LOVENOX) injection  40 mg Subcutaneous Q24H  . gabapentin  300 mg Oral TID  . Influenza vac split quadrivalent PF  0.5 mL Intramuscular Tomorrow-1000  . insulin aspart  0-5 Units Subcutaneous QHS  . insulin aspart  0-9 Units Subcutaneous TID WC  . isosorbide mononitrate  60 mg Oral Daily  . levothyroxine  25 mcg Oral QAC breakfast  . multivitamin with minerals  1 tablet Oral Daily  . pantoprazole  40 mg Oral BID  . rosuvastatin  40 mg Oral q1800  . sodium chloride flush  3 mL Intravenous Q12H  .  traZODone  200 mg Oral QHS   acetaminophen **OR** acetaminophen, acetaminophen-codeine, morphine injection, nitroGLYCERIN, ondansetron **OR** ondansetron (ZOFRAN) IV, oxyCODONE, traMADol, zolpidem  Assessment/ Plan:  45 y.o. female [name] past medical history hypertension, diabetes mellitus for over 20 years, diabetic retinopathy, history of CVA, gastric ulcer, colonic polyps, hyperlipidemia, gastric bypass performed [facility], CVA with mild L sided weakness 2015, carotild artery bypass and depression who returns today for of chronic kidney disease  stage III.  1. Acute renal failure/Chronic kidney disease stage III/proteinuria.. Baseline creatinine 1.4 with EGFR 45. Renal ultrasound negative for obstruction.   -  Renal function improved, CR down to 1.8, encouraged PO hydration to the patient.   2. Hypertension. Blood pressure 129/73 this AM.  Continue diltiazem for now.  3. Iron deficiency anemia.  hgb remains stable at 11, continue to monitor.   4.  Fever, possible sepsis/left flank pain.  Had fever again last night, despite being on abx, will check CT scan abd/pelvis w/o contrast for further work up.   LOS: 1 [name] 9/28/201710:46 AM

## 2015-10-31 NOTE — Progress Notes (Signed)
[place] [facility] [place] NAME: [name]    MR#:  [id number]  DATE OF BIRTH:  08-13-1970  SUBJECTIVE:  CHIEF COMPLAINT:   Chief Complaint  Patient presents with  . Chest Pain   Again had fever last night. Have more pain in left flank pain. Complains of chronic back pain and on and off chest pain.  REVIEW OF SYSTEMS:  Review of Systems  Constitutional: Positive for chills, fever and malaise/fatigue.  HENT: Negative for sore throat.   Eyes: Negative for blurred vision and double vision.  Respiratory: Negative for cough, shortness of breath, wheezing and stridor.   Cardiovascular: Positive for chest pain. Negative for palpitations and leg swelling.  Gastrointestinal: Positive for nausea. Negative for abdominal pain, blood in stool, melena and vomiting.  Genitourinary: Positive for flank pain and frequency. Negative for dysuria and urgency.  Musculoskeletal: Negative for joint pain.  Skin: Negative for itching and rash.  Neurological: Negative for dizziness, seizures, loss of consciousness and headaches.  Psychiatric/Behavioral: Negative for depression. The patient is not nervous/anxious.    DRUG ALLERGIES:  No Known Allergies VITALS:  Blood pressure 125/66, pulse 72, temperature 98.4 F (36.9 C), temperature source Oral, resp. rate 12, height 5\' 2"  (1.575 m), weight 81.3 kg (179 lb 3.2 oz), SpO2 94 %. PHYSICAL EXAMINATION:  Physical Exam  Constitutional: She is oriented to person, place, and time and well-developed, well-nourished, and in no distress. No distress.  HENT:  Head: Normocephalic.  Mouth/Throat: Oropharynx is clear and moist.  Eyes: Conjunctivae and EOM are normal. No scleral icterus.  Neck: Normal range of motion. Neck supple. No JVD present. No thyromegaly present.  Cardiovascular: Normal rate, regular rhythm and normal heart sounds.  Exam reveals no gallop.   No murmur heard. Pulmonary/Chest: Effort normal and  breath sounds normal. No respiratory distress. She has no wheezes. She has no rales.  Abdominal: Soft. Bowel sounds are normal. She exhibits no distension. There is no tenderness.  Genitourinary:  Genitourinary Comments: Left side flank tenderness.  Musculoskeletal: Normal range of motion. She exhibits no edema or tenderness.  Neurological: She is alert and oriented to person, place, and time. No cranial nerve deficit.  Skin: Skin is warm and dry.  Psychiatric: Affect and judgment normal.   LABORATORY PANEL:   CBC  Recent Labs Lab 10/31/15 0325  WBC 8.5  HGB 11.0*  HCT 32.5*  PLT 138*   ------------------------------------------------------------------------------------------------------------------ Chemistries   Recent Labs Lab 10/28/15 0859  10/30/15 0400 10/31/15 0325  NA 135  < > 138 135  K 3.7  < > 3.5 3.9  CL 101  < > 112* 108  CO2 22  < > 23 21*  GLUCOSE 123*  < > 142* 138*  BUN 33*  < > 30* 22*  CREATININE 2.32*  < > 1.95* 1.84*  CALCIUM 8.6*  < > 7.9* 7.9*  MG  --   < > 2.2  --   AST 24  --   --   --   ALT 17  --   --   --   ALKPHOS 84  --   --   --   BILITOT 0.8  --   --   --   < > = values in this interval not displayed. RADIOLOGY:  Ct Abdomen Pelvis Wo Contrast  Result Date: 10/31/2015 CLINICAL DATA:  Left-sided flank pain since Monday. EXAM: CT ABDOMEN AND PELVIS WITHOUT CONTRAST TECHNIQUE: Multidetector CT imaging of  the abdomen and pelvis was performed following the standard protocol without IV contrast. COMPARISON:  10/29/2012 FINDINGS: Lower chest: Tortuous aorta. Small pleural effusions mild basilar atelectasis. There is body wall edema but no interstitial thickening to suggest pulmonary edema. Hepatobiliary: No focal liver abnormality.No evidence of biliary obstruction or stone. Pancreas: Unremarkable. Spleen: Stable generous size without focal abnormality. Adrenals/Urinary Tract: Negative adrenals. Mild left hydroureter without visible obstruction.  Perinephric stranding is symmetric. Left hilar calcifications are likely vascular. Unremarkable bladder. Stomach/Bowel: Status post gastric bypass. No bowel obstruction. Formed stool throughout the colon. Appendectomy. Vascular/Lymphatic: Diffuse arterial calcification. Chronic reactive appearing enlargement of deep liver drainage lymph nodes. Reproductive:Negative for age. Other: No ascites or pneumoperitoneum. Musculoskeletal: No acute abnormalities. Spondylosis and chronic endplate spurs. IMPRESSION: 1. Mild left hydroureter without visible stone or other obstructive process. Correlate for signs of ascending infection. 2. Trace pleural effusions and mild basilar atelectasis. 3. Possible constipation. Electronically Signed   By: Monte Fantasia M.D.   On: 10/31/2015 16:16   ASSESSMENT AND PLAN:   45 year old female with past medical history of hypertension, hyperlipidemia, coronary disease status post stent placement, history of aortic arch aneurysm, diabetes imaging to the hospital due to chest pain.  * UTI with sepsis Ucx with Klebsiella Pneumoniae- sensitive to rocephin. Continue IV abx. Continue to have fever.  also have left flank pain- get CT abd.  * Stable angina. coronary disease. Per Dr. [name], No plans at this time for cardiac intervention given stable symptoms, recent intervention several months ago, worsening renal function.  Continue aspirin, Plavix, Imdur, Crestor, Cardizem.  * Acute kidney injury on CKD stage 3.  Baseline creatinines are 1.4 currently elevated at 2.3. continue IV fluids, follow BUN and creatinine. -Hold losartan, Lasix for now. Renal US - nothing acute. Discussed with Dr. Holley Raring.   * Diabetes type 2 without complication-hold metformin given the patient's acute kidney injury. Continue sliding scale.  * Hypothyroidism-continue Synthroid.  *  Diabetic neuropathy-continue lyrica, gabapentin.   Hypokalemia. Given KCl. Hypomagnesemia. Given mag iv, f/u  mag.  All the records are reviewed and case discussed with Dr. [name], RN, Care Management/Social Worker, Dr. Holley Raring. Management plans discussed with the patient, family and they are in agreement.  CODE STATUS: Full code.  TOTAL TIME TAKING CARE OF THIS PATIENT: 35 minutes.   POSSIBLE D/C IN 2 DAYS, DEPENDING ON CLINICAL CONDITION.  Vaughan Basta M.D on 10/31/2015 at 4:37 PM  Between 7am to 6pm - Pager - [phone]  After 6pm go to [email or web address] - Proofreader  [facility]  Office  [phone]  CC: Primary care physician; [name], MD  Note: This dictation was prepared with Dragon dictation along with smaller phrase technology. Any transcriptional errors that result from this process are unintentional.

## 2015-11-01 LAB — BASIC METABOLIC PANEL
ANION GAP: 4 — AB (ref 5–15)
BUN: 19 mg/dL (ref 6–20)
CHLORIDE: 112 mmol/L — AB (ref 101–111)
CO2: 25 mmol/L (ref 22–32)
Calcium: 8.3 mg/dL — ABNORMAL LOW (ref 8.9–10.3)
Creatinine, Ser: 1.72 mg/dL — ABNORMAL HIGH (ref 0.44–1.00)
GFR calc Af Amer: 40 mL/min — ABNORMAL LOW (ref >60)
GFR calc non Af Amer: 35 mL/min — ABNORMAL LOW (ref >60)
GLUCOSE: 84 mg/dL (ref 65–99)
POTASSIUM: 4.2 mmol/L (ref 3.5–5.1)
SODIUM: 141 mmol/L (ref 135–145)

## 2015-11-01 LAB — GLUCOSE, CAPILLARY
GLUCOSE-CAPILLARY: 122 mg/dL — AB (ref 65–99)
GLUCOSE-CAPILLARY: 183 mg/dL — AB (ref 65–99)
GLUCOSE-CAPILLARY: 154 mg/dL — AB (ref 65–99)
Glucose-Capillary: 173 mg/dL — ABNORMAL HIGH (ref 65–99)

## 2015-11-01 MED ORDER — CEPHALEXIN 500 MG PO CAPS
500.0000 mg | ORAL_CAPSULE | Freq: Two times a day (BID) | ORAL | Status: DC
  Administered 2015-11-02: 500 mg via ORAL
  Filled 2015-11-01: qty 1

## 2015-11-01 MED ORDER — INSULIN ASPART 100 UNIT/ML ~~LOC~~ SOLN
2.0000 [IU] | Freq: Three times a day (TID) | SUBCUTANEOUS | Status: DC
  Administered 2015-11-01 – 2015-11-02 (×2): 2 [IU] via SUBCUTANEOUS
  Filled 2015-11-01 (×2): qty 2

## 2015-11-01 NOTE — Progress Notes (Signed)
[place] at [place] NAME: [name]    MR#:  [id number]  DATE OF BIRTH:  [birth year]  SUBJECTIVE:  CHIEF COMPLAINT:   Chief Complaint  Patient presents with  . Chest Pain   No fever for last 36 hours. Have pain in left flank pain. Complains of chronic back pain and on and off chest pain.  REVIEW OF SYSTEMS:  Review of Systems  Constitutional: Negative for chills, fever and malaise/fatigue.  HENT: Negative for sore throat.   Eyes: Negative for blurred vision and double vision.  Respiratory: Negative for cough, shortness of breath, wheezing and stridor.   Cardiovascular: Negative for chest pain, palpitations and leg swelling.  Gastrointestinal: Negative for abdominal pain, blood in stool, melena, nausea and vomiting.  Genitourinary: Positive for flank pain and frequency. Negative for dysuria and urgency.  Musculoskeletal: Negative for joint pain.  Skin: Negative for itching and rash.  Neurological: Negative for dizziness, seizures, loss of consciousness and headaches.  Psychiatric/Behavioral: Negative for depression. The patient is not nervous/anxious.    DRUG ALLERGIES:  No Known Allergies VITALS:  Blood pressure 134/69, pulse 78, temperature 98.6 F (37 C), temperature source Oral, resp. rate 14, height 5\' 2"  (1.575 m), weight 81.3 kg (179 lb 3.2 oz), SpO2 97 %. PHYSICAL EXAMINATION:  Physical Exam  Constitutional: She is oriented to person, place, and time and well-developed, well-nourished, and in no distress. No distress.  HENT:  Head: Normocephalic.  Mouth/Throat: Oropharynx is clear and moist.  Eyes: Conjunctivae and EOM are normal. No scleral icterus.  Neck: Normal range of motion. Neck supple. No JVD present. No thyromegaly present.  Cardiovascular: Normal rate, regular rhythm and normal heart sounds.  Exam reveals no gallop.   No murmur heard. Pulmonary/Chest: Effort normal and breath sounds normal. No  respiratory distress. She has no wheezes. She has no rales.  Abdominal: Soft. Bowel sounds are normal. She exhibits no distension. There is no tenderness.  Genitourinary:  Genitourinary Comments: Left side flank tenderness.  Musculoskeletal: Normal range of motion. She exhibits no edema or tenderness.  Neurological: She is alert and oriented to person, place, and time. No cranial nerve deficit.  Skin: Skin is warm and dry.  Psychiatric: Affect and judgment normal.   LABORATORY PANEL:   CBC  Recent Labs Lab 10/31/15 0325  WBC 8.5  HGB 11.0*  HCT 32.5*  PLT 138*   ------------------------------------------------------------------------------------------------------------------ Chemistries   Recent Labs Lab 10/28/15 0859  10/30/15 0400  11/01/15 0411  NA 135  < > 138  < > 141  K 3.7  < > 3.5  < > 4.2  CL 101  < > 112*  < > 112*  CO2 22  < > 23  < > 25  GLUCOSE 123*  < > 142*  < > 84  BUN 33*  < > 30*  < > 19  CREATININE 2.32*  < > 1.95*  < > 1.72*  CALCIUM 8.6*  < > 7.9*  < > 8.3*  MG  --   < > 2.2  --   --   AST 24  --   --   --   --   ALT 17  --   --   --   --   ALKPHOS 84  --   --   --   --   BILITOT 0.8  --   --   --   --   < > = values  in this interval not displayed. RADIOLOGY:  No results found. ASSESSMENT AND PLAN:   45 year old female with past medical history of hypertension, hyperlipidemia, coronary disease status post stent placement, history of aortic arch aneurysm, diabetes imaging to the hospital due to chest pain.  * UTI with sepsis Ucx with Klebsiella Pneumoniae- sensitive to rocephin. Continue IV abx.  also have left flank pain- got CT abd.   Showed hydroureter, called urology consult.  * Stable angina. coronary disease. Per Dr. [name], No plans at this time for cardiac intervention given stable symptoms, recent intervention several months ago, worsening renal function.  Continue aspirin, Plavix, Imdur, Crestor, Cardizem.  * Acute kidney  injury on CKD stage 3.  Baseline creatinines are 1.4 currently elevated at 2.3. continue IV fluids, follow BUN and creatinine. -Hold losartan, Lasix for now. Renal US - nothing acute. Discussed with Dr. Holley Raring.   * Diabetes type 2 without complication-hold metformin given the patient's acute kidney injury. Continue sliding scale.  * Hypothyroidism-continue Synthroid.  * Diabetic neuropathy-continue lyrica, gabapentin.  * Hypokalemia. Given KCl. Hypomagnesemia. Given mag iv, f/u mag.  All the records are reviewed and case discussed with Dr. [name], RN, Care Management/Social Worker, Dr. Holley Raring. Management plans discussed with the patient, family and they are in agreement.  CODE STATUS: Full code.  TOTAL TIME TAKING CARE OF THIS PATIENT: 35 minutes.   POSSIBLE D/C IN 2 DAYS, DEPENDING ON CLINICAL CONDITION.  Vaughan Basta M.D on 11/01/2015 at 7:11 PM  Between 7am to 6pm - Pager - [phone]  After 6pm go to [email or web address] - Proofreader  [facility]  Office  [phone]  CC: Primary care physician; [name], MD  Note: This dictation was prepared with Dragon dictation along with smaller phrase technology. Any transcriptional errors that result from this process are unintentional.

## 2015-11-01 NOTE — Progress Notes (Signed)
[facility]  ROUNDING NOTE   Subjective:  Pt was found to have mild left-sided hydroureter on CT scan of the abdomen and pelvis. No obvious stone was found however. She could potentially have passed a stone. She still has some mild left-sided flank pain. Creatinine is decreasing and urine output currently 3.8 L.  Objective:  Vital signs in last 24 hours:  Temp:  [97.8 F (36.6 C)-98.7 F (37.1 C)] 98.6 F (37 C) (09/29 1151) Pulse Rate:  [78-103] 78 (09/29 1151) Resp:  [14-20] 14 (09/29 1151) BP: (122-136)/(67-77) 134/69 (09/29 1151) SpO2:  [95 %-97 %] 97 % (09/29 1151)  Weight change:  Filed Weights   10/28/15 0853 10/28/15 1602  Weight: 81.2 kg (179 lb) 81.3 kg (179 lb 3.2 oz)    Intake/Output: I/O last 3 completed shifts: In: 26 [P.O.:960] Out: 3800 [Urine:3800]   Intake/Output this shift:  Total I/O In: 240 [P.O.:240] Out: 600 [Urine:600]  Physical Exam: General: No acute distress  Head: Normocephalic, atraumatic. Moist oral mucosal membranes  Eyes: Anicteric  Neck: Supple, trachea midline  Lungs:  Clear to auscultation, normal effort  Heart: S1S2 no rubs  Abdomen:  Soft, mild left flank tenderness  Extremities: No peripheral edema.  Neurologic: Nonfocal, moving all four extremities  Skin: No lesions  Access:     Basic Metabolic Panel:  Recent Labs Lab 10/28/15 0859 10/28/15 1712 10/29/15 0630 10/30/15 0400 10/31/15 0325 11/01/15 0411  NA 135  --  136 138 135 141  K 3.7  --  3.2* 3.5 3.9 4.2  CL 101  --  106 112* 108 112*  CO2 22  --  21* 23 21* 25  GLUCOSE 123*  --  202* 142* 138* 84  BUN 33*  --  31* 30* 22* 19  CREATININE 2.32* 1.96* 2.13* 1.95* 1.84* 1.72*  CALCIUM 8.6*  --  7.5* 7.9* 7.9* 8.3*  MG  --   --  1.1* 2.2  --   --     Liver Function Tests:  Recent Labs Lab 10/28/15 0859  AST 24  ALT 17  ALKPHOS 84  BILITOT 0.8  PROT 7.4  ALBUMIN 4.0   No results for input(s): LIPASE, AMYLASE in the last 168  hours. No results for input(s): AMMONIA in the last 168 hours.  CBC:  Recent Labs Lab 10/28/15 0859 10/28/15 1712 10/29/15 0630 10/30/15 0400 10/31/15 0325  WBC 16.1* 16.7* 19.8* 9.8 8.5  NEUTROABS 14.2*  --   --   --   --   HGB 13.2 12.5 12.2 11.0* 11.0*  HCT 38.2 36.3 36.0 31.8* 32.5*  MCV 78.3* 78.4* 79.4* 78.6* 79.4*  PLT 160 155 151 122* 138*    Cardiac Enzymes:  Recent Labs Lab 10/28/15 0859 10/28/15 1410  TROPONINI <0.03 <0.03    BNP: Invalid input(s): POCBNP  CBG:  Recent Labs Lab 10/31/15 1057 10/31/15 1659 10/31/15 2109 11/01/15 0732 11/01/15 1151  GLUCAP 185* 139* 335* 36* 173*    Microbiology: Results for orders placed or performed during the hospital encounter of 10/28/15  Urine culture     Status: Abnormal   Collection Time: 10/28/15  4:30 PM  Result Value Ref Range Status   Specimen Description URINE, CLEAN CATCH  Final   Special Requests Normal  Final   Culture >=100,000 COLONIES/mL KLEBSIELLA PNEUMONIAE (A)  Final   Report Status 10/31/2015 FINAL  Final   Organism ID, Bacteria KLEBSIELLA PNEUMONIAE (A)  Final      Susceptibility   Klebsiella pneumoniae -  MIC*    AMPICILLIN >=32 RESISTANT Resistant     CEFAZOLIN <=4 SENSITIVE Sensitive     CEFTRIAXONE <=1 SENSITIVE Sensitive     CIPROFLOXACIN <=0.25 SENSITIVE Sensitive     GENTAMICIN <=1 SENSITIVE Sensitive     IMIPENEM <=0.25 SENSITIVE Sensitive     NITROFURANTOIN 64 INTERMEDIATE Intermediate     TRIMETH/SULFA >=320 RESISTANT Resistant     AMPICILLIN/SULBACTAM 4 SENSITIVE Sensitive     PIP/TAZO <=4 SENSITIVE Sensitive     Extended ESBL NEGATIVE Sensitive     * >=100,000 COLONIES/mL KLEBSIELLA PNEUMONIAE  CULTURE, BLOOD (ROUTINE X 2) w Reflex to ID Panel     Status: None (Preliminary result)   Collection Time: 10/28/15  5:12 PM  Result Value Ref Range Status   Specimen Description BLOOD RIGHT ARM  Final   Special Requests BOTTLES DRAWN AEROBIC AND ANAEROBIC 5CC  Final   Culture  NO GROWTH 4 DAYS  Final   Report Status PENDING  Incomplete  CULTURE, BLOOD (ROUTINE X 2) w Reflex to ID Panel     Status: None (Preliminary result)   Collection Time: 10/28/15  6:38 PM  Result Value Ref Range Status   Specimen Description BLOOD PERIPHERAL  Final   Special Requests BOTTLES DRAWN AEROBIC AND ANAEROBIC 5CC  Final   Culture NO GROWTH 4 DAYS  Final   Report Status PENDING  Incomplete    Coagulation Studies: No results for input(s): LABPROT, INR in the last 72 hours.  Urinalysis: No results for input(s): COLORURINE, LABSPEC, PHURINE, GLUCOSEU, HGBUR, BILIRUBINUR, KETONESUR, PROTEINUR, UROBILINOGEN, NITRITE, LEUKOCYTESUR in the last 72 hours.  Invalid input(s): APPERANCEUR    Imaging: Ct Abdomen Pelvis Wo Contrast  Result Date: 10/31/2015 CLINICAL DATA:  Left-sided flank pain since Monday. EXAM: CT ABDOMEN AND PELVIS WITHOUT CONTRAST TECHNIQUE: Multidetector CT imaging of the abdomen and pelvis was performed following the standard protocol without IV contrast. COMPARISON:  10/29/2012 FINDINGS: Lower chest: Tortuous aorta. Small pleural effusions mild basilar atelectasis. There is body wall edema but no interstitial thickening to suggest pulmonary edema. Hepatobiliary: No focal liver abnormality.No evidence of biliary obstruction or stone. Pancreas: Unremarkable. Spleen: Stable generous size without focal abnormality. Adrenals/Urinary Tract: Negative adrenals. Mild left hydroureter without visible obstruction. Perinephric stranding is symmetric. Left hilar calcifications are likely vascular. Unremarkable bladder. Stomach/Bowel: Status post gastric bypass. No bowel obstruction. Formed stool throughout the colon. Appendectomy. Vascular/Lymphatic: Diffuse arterial calcification. Chronic reactive appearing enlargement of deep liver drainage lymph nodes. Reproductive:Negative for age. Other: No ascites or pneumoperitoneum. Musculoskeletal: No acute abnormalities. Spondylosis and chronic  endplate spurs. IMPRESSION: 1. Mild left hydroureter without visible stone or other obstructive process. Correlate for signs of ascending infection. 2. Trace pleural effusions and mild basilar atelectasis. 3. Possible constipation. Electronically Signed   By: Monte Fantasia M.D.   On: 10/31/2015 16:16     Medications:     . aspirin EC  81 mg Oral Daily  . cefTRIAXone (ROCEPHIN)  IV  1 g Intravenous Q24H  . citalopram  40 mg Oral Daily  . clopidogrel  75 mg Oral Daily  . diltiazem  120 mg Oral Daily  . enoxaparin (LOVENOX) injection  40 mg Subcutaneous Q24H  . gabapentin  300 mg Oral TID  . Influenza vac split quadrivalent PF  0.5 mL Intramuscular Tomorrow-1000  . insulin aspart  0-5 Units Subcutaneous QHS  . insulin aspart  0-9 Units Subcutaneous TID WC  . isosorbide mononitrate  60 mg Oral Daily  . levothyroxine  25 mcg Oral  QAC breakfast  . magic mouthwash  10 mL Oral TID  . multivitamin with minerals  1 tablet Oral Daily  . pantoprazole  40 mg Oral BID  . rosuvastatin  40 mg Oral q1800  . sodium chloride flush  3 mL Intravenous Q12H  . traZODone  200 mg Oral QHS   acetaminophen **OR** acetaminophen, acetaminophen-codeine, lidocaine, morphine injection, nitroGLYCERIN, ondansetron **OR** ondansetron (ZOFRAN) IV, oxyCODONE, traMADol, zolpidem  Assessment/ Plan:  45 y.o. female [name] past medical history hypertension, diabetes mellitus for over 20 years, diabetic retinopathy, history of CVA, gastric ulcer, colonic polyps, hyperlipidemia, gastric bypass performed [facility], CVA with mild L sided weakness 2015, carotild artery bypass and depression who returns today for of chronic kidney disease stage III.  1. Acute renal failure/Chronic kidney disease stage III/proteinuria.. Baseline creatinine 1.4 with EGFR 45. Renal ultrasound negative for obstruction.   -  enal function continues to improve.  Creatinine currently down to 1.7 with excellent urine output.   Continue to monitor renal function trend.  2. Hypertension. Blood pressure Well-controlled at 134/69 Continue diltiazem for now.  3. Iron deficiency anemia.  No new hemoglobin today.  No indication for Procrit.  4.  Fever, possible sepsis/left flank pain/left sided hydroureter/UTI:  Patient was found to have a urinary tract infection.  She also had left-sided flank pain and has left-sided hydroureter.  No obvious stone noted.  Given the mild left-sided hydroureter we will consult with urology for their input.   LOS: 2 Shaida Route 9/29/201712:24 PM

## 2015-11-02 DIAGNOSIS — N1330 Unspecified hydronephrosis: Secondary | ICD-10-CM

## 2015-11-02 DIAGNOSIS — N319 Neuromuscular dysfunction of bladder, unspecified: Secondary | ICD-10-CM

## 2015-11-02 DIAGNOSIS — N12 Tubulo-interstitial nephritis, not specified as acute or chronic: Secondary | ICD-10-CM

## 2015-11-02 LAB — CULTURE, BLOOD (ROUTINE X 2)
Culture: NO GROWTH
Culture: NO GROWTH

## 2015-11-02 LAB — GLUCOSE, CAPILLARY: GLUCOSE-CAPILLARY: 116 mg/dL — AB (ref 65–99)

## 2015-11-02 MED ORDER — CEFPODOXIME PROXETIL 200 MG PO TABS: 200.0000 mg | ORAL_TABLET | Freq: Two times a day (BID) | ORAL | 0 refills | Status: DC

## 2015-11-02 MED ORDER — OXYCODONE HCL 5 MG PO TABS: 5.0000 mg | ORAL_TABLET | Freq: Four times a day (QID) | ORAL | 0 refills | Status: DC | PRN

## 2015-11-02 NOTE — Discharge Summary (Addendum)
[facility] [place] [facility]   PATIENT NAME: Linsy Baldassari    MR#:  [id number]  DATE OF BIRTH:  30-Apr-1970  DATE OF ADMISSION:  10/28/2015 ADMITTING PHYSICIAN: Houston Siren, MD  DATE OF DISCHARGE: 11/02/15  PRIMARY CARE PHYSICIAN: Mila Merry, MD    ADMISSION DIAGNOSIS:  Shortness of breath [R06.02] Diaphoresis [R61] Acute renal insufficiency [N28.9] Chest pain, unspecified chest pain type [R07.9] Non-intractable vomiting with nausea, vomiting of unspecified type [R11.2]  DISCHARGE DIAGNOSIS:  Principal Problem:   Sepsis (HCC)   Acute pyelonephritis Acute renal insufficiency Active Problems:   UTI (lower urinary tract infection)   CKD (chronic kidney disease), stage IV (HCC)   CAD (coronary artery disease)   Hypertensive heart disease   Diaphoresis   Emesis      SECONDARY DIAGNOSIS:   Past Medical History:  Diagnosis Date  . Anxiety   . Aortic arch aneurysm (HCC)   . CAD (coronary artery disease)    a. cath 08/2003 nonobs dzs;  b. 11/2014: mLAD 85-90% s/p PCI/DES (Xience 3.25 x 23); c. cath 04/2015: 70% mD1 @ bifurcation. Patent LAD stent - > Med Rx.; d. Cath-PCI 5/17: Patent LAD stent, D1 now ~80% - PCI Resolute DES 2.25 x 12.    . Chronic systolic CHF (congestive heart failure) (HCC)    a. echo 01/29/2013 EF 40-45%, basal anteroseptal & mid anteroseptal segments abnl; b echo 05/2012 EF 50-55%, mildly dilated LA, nl RVSP; c. 10/2015 Echo: EF 55-60%, mild conc LVH, no rwma.  . CKD (chronic kidney disease), stage IV (HCC)   . Colon polyp   . CVA (cerebral vascular accident) (HCC)    a. 06/2008-TEE nl LV fxn; b. 01/2013 - notes indicate short run of SVT at that time  . Depression   . Diabetes mellitus without complication (HCC)   . Gastric ulcer 04/27/2011  . GERD (gastroesophageal reflux disease)   . Headache    Migrains  . Hyperlipidemia   . Hypertensive heart disease   . Hypothyroidism   . Ischemic cardiomyopathy    a. EF prev  40-45% in 2014-->Improved to 55-60% (10/2015).  . Malignant melanoma of skin of scalp (HCC)   . Morbid obesity (HCC)    a. s/p gastric bypass.  . Multiple sclerosis (HCC) 2015  . Orthostatic hypotension   . Syncope     HOSPITAL COURSE:  Classie Aujla [name]  is a 45 y.o. female admitted 10/28/2015 with chief complaint Chest Pain . Please see H&P performed by Houston Siren, MD for further information. Patient presented with chest pain which on further questioning was actually flank pain. Noted to have AKI on admission with Cr of 2.3. Meeting septic criteria on admission. Secondary to UTI - Klebsiella Pneumoniae, placed on broad antibiotics. She was evaluated by urology and nephrology - with improvement of symptoms and renal function prior to discharge     DISCHARGE CONDITIONS:   stable  CONSULTS OBTAINED:  Treatment Team:  Mady Haagensen, MD Iran Ouch, MD Malen Gauze, MD  DRUG ALLERGIES:  No Known Allergies  DISCHARGE MEDICATIONS:   Current Discharge Medication List    START taking these medications   Details  cefpodoxime (VANTIN) 200 MG tablet Take 1 tablet (200 mg total) by mouth 2 (two) times daily. Qty: 28 tablet, Refills: 0    oxyCODONE (OXY IR/ROXICODONE) 5 MG immediate release tablet Take 1 tablet (5 mg total) by mouth every 6 (six) hours as needed for moderate pain. Qty:  30 tablet, Refills: 0      CONTINUE these medications which have NOT CHANGED   Details  aspirin EC 81 MG tablet Take 1 tablet (81 mg total) by mouth daily.    baclofen (LIORESAL) 10 MG tablet Take 1 tablet (10 mg total) by mouth 2 (two) times daily as needed for muscle spasms. Refills: 0    citalopram (CELEXA) 40 MG tablet Take 40 mg by mouth daily.    clopidogrel (PLAVIX) 75 MG tablet Take 75 mg by mouth daily.    diltiazem (CARDIZEM SR) 120 MG 12 hr capsule Take 120 mg by mouth daily.   Associated Diagnoses: Iron deficiency anemia    empagliflozin (JARDIANCE) 25 MG TABS tablet  Take 25 mg by mouth daily.    eszopiclone (LUNESTA) 2 MG TABS tablet Take 1 tablet (2 mg total) by mouth at bedtime as needed for sleep. Take immediately before bedtime Qty: 30 tablet, Refills: 0   Associated Diagnoses: Insomnia    furosemide (LASIX) 20 MG tablet Take 20 mg by mouth daily.    gabapentin (NEURONTIN) 300 MG capsule Take 300 mg by mouth 3 (three) times daily.    isosorbide mononitrate (IMDUR) 60 MG 24 hr tablet Take 60 mg by mouth daily.   Associated Diagnoses: Iron deficiency anemia    levothyroxine (SYNTHROID, LEVOTHROID) 25 MCG tablet Take 1 tablet (25 mcg total) by mouth daily before breakfast. Qty: 30 tablet, Refills: 5    losartan (COZAAR) 25 MG tablet Take 25 mg by mouth daily.    metFORMIN (GLUCOPHAGE) 1000 MG tablet Take 1,000 mg by mouth 2 (two) times daily with a meal.     Multiple Vitamin (MULTIVITAMIN WITH MINERALS) TABS tablet Take 1 tablet by mouth daily.    nitroGLYCERIN (NITROSTAT) 0.4 MG SL tablet Place 1 tablet (0.4 mg total) under the tongue every 5 (five) minutes as needed for chest pain. Qty: 25 tablet, Refills: 6    pantoprazole (PROTONIX) 40 MG tablet Take 1 tablet (40 mg total) by mouth 2 (two) times daily. Qty: 60 tablet, Refills: 5    promethazine (PHENERGAN) 25 MG tablet Take 25 mg by mouth every 6 (six) hours as needed for nausea or vomiting. Reported on 05/31/2015    rosuvastatin (CRESTOR) 40 MG tablet Take 1 tablet (40 mg total) by mouth daily at 6 PM. Qty: 90 tablet, Refills: 3    traZODone (DESYREL) 100 MG tablet Take 200 mg by mouth at bedtime.    ergocalciferol (VITAMIN D2) 50000 units capsule Take 50,000 Units by mouth once a week.   Associated Diagnoses: Iron deficiency anemia    pregabalin (LYRICA) 100 MG capsule Take 100 mg by mouth 2 (two) times daily.   Associated Diagnoses: Iron deficiency anemia         DISCHARGE INSTRUCTIONS:   1. Pyelonephritis: Please transition pt to Vantin 200mg  BID for 14 days.   2.  Neurogenic bladder: I discussed treatment with the patient including CIC versus indwelling foley and the patient defers at this time. Pt istructed to performed timed voiding every 2 hours and double void.  3. Mild left hydronephrosis: The hydronephrosis is likely related to a distended bladder and poor bladder emptying. She will need to followup with [facility] in 2 weeks after discharge with a renal and bladder US DIET:  Regular diet  DISCHARGE CONDITION:  Stable  ACTIVITY:  Activity as tolerated  OXYGEN:  Home Oxygen: No.   Oxygen Delivery: room air  DISCHARGE LOCATION:  home   If  you experience worsening of your admission symptoms, develop shortness of breath, life threatening emergency, suicidal or homicidal thoughts you must seek medical attention immediately by calling 911 or calling your MD immediately  if symptoms less severe.  You Must read complete instructions/literature along with all the possible adverse reactions/side effects for all the Medicines you take and that have been prescribed to you. Take any new Medicines after you have completely understood and accpet all the possible adverse reactions/side effects.   Please note  You were cared for by a hospitalist during your hospital stay. If you have any questions about your discharge medications or the care you received while you were in the hospital after you are discharged, you can call the unit and asked to speak with the hospitalist on call if the hospitalist that took care of you is not available. Once you are discharged, your primary care physician will handle any further medical issues. Please note that NO REFILLS for any discharge medications will be authorized once you are discharged, as it is imperative that you return to your primary care physician (or establish a relationship with a primary care physician if you do not have one) for your aftercare needs so that they can reassess your need for medications  and monitor your lab values.    On the day of Discharge:   VITAL SIGNS:  Blood pressure (!) 148/71, pulse 64, temperature 98.6 F (37 C), temperature source Oral, resp. rate 18, height 5\' 2"  (1.575 m), weight 81.3 kg (179 lb 3.2 oz), SpO2 95 %.  I/O:   Intake/Output Summary (Last 24 hours) at 11/02/15 1023 Last data filed at 11/02/15 0900  Gross per 24 hour  Intake             1400 ml  Output             4450 ml  Net            -3050 ml    PHYSICAL EXAMINATION:  GENERAL:  45 y.o.-year-old patient lying in the bed with no acute distress.  EYES: Pupils equal, round, reactive to light and accommodation. No scleral icterus. Extraocular muscles intact.  HEENT: Head atraumatic, normocephalic. Oropharynx and nasopharynx clear.  NECK:  Supple, no jugular venous distention. No thyroid enlargement, no tenderness.  LUNGS: Normal breath sounds bilaterally, no wheezing, rales,rhonchi or crepitation. No use of accessory muscles of respiration.  CARDIOVASCULAR: S1, S2 normal. No murmurs, rubs, or gallops.  ABDOMEN: Soft, non-tender, non-distended. Bowel sounds present. No organomegaly or mass.  EXTREMITIES: No pedal edema, cyanosis, or clubbing.  NEUROLOGIC: Cranial nerves II through XII are intact. Muscle strength 5/5 in all extremities. Sensation intact. Gait not checked.  PSYCHIATRIC: The patient is alert and oriented x 3.  SKIN: No obvious rash, lesion, or ulcer.   DATA REVIEW:   CBC  Recent Labs Lab 10/31/15 0325  WBC 8.5  HGB 11.0*  HCT 32.5*  PLT 138*    Chemistries   Recent Labs Lab 10/28/15 0859  10/30/15 0400  11/01/15 0411  NA 135  < > 138  < > 141  K 3.7  < > 3.5  < > 4.2  CL 101  < > 112*  < > 112*  CO2 22  < > 23  < > 25  GLUCOSE 123*  < > 142*  < > 84  BUN 33*  < > 30*  < > 19  CREATININE 2.32*  < > 1.95*  < > 1.72*  CALCIUM 8.6*  < > 7.9*  < > 8.3*  MG  --   < > 2.2  --   --   AST 24  --   --   --   --   ALT 17  --   --   --   --   ALKPHOS 84  --   --    --   --   BILITOT 0.8  --   --   --   --   < > = values in this interval not displayed.  Cardiac Enzymes  Recent Labs Lab 10/28/15 1410  TROPONINI <0.03    Microbiology Results  Results for orders placed or performed during the hospital encounter of 10/28/15  Urine culture     Status: Abnormal   Collection Time: 10/28/15  4:30 PM  Result Value Ref Range Status   Specimen Description URINE, CLEAN CATCH  Final   Special Requests Normal  Final   Culture >=100,000 COLONIES/mL KLEBSIELLA PNEUMONIAE (A)  Final   Report Status 10/31/2015 FINAL  Final   Organism ID, Bacteria KLEBSIELLA PNEUMONIAE (A)  Final      Susceptibility   Klebsiella pneumoniae - MIC*    AMPICILLIN >=32 RESISTANT Resistant     CEFAZOLIN <=4 SENSITIVE Sensitive     CEFTRIAXONE <=1 SENSITIVE Sensitive     CIPROFLOXACIN <=0.25 SENSITIVE Sensitive     GENTAMICIN <=1 SENSITIVE Sensitive     IMIPENEM <=0.25 SENSITIVE Sensitive     NITROFURANTOIN 64 INTERMEDIATE Intermediate     TRIMETH/SULFA >=320 RESISTANT Resistant     AMPICILLIN/SULBACTAM 4 SENSITIVE Sensitive     PIP/TAZO <=4 SENSITIVE Sensitive     Extended ESBL NEGATIVE Sensitive     * >=100,000 COLONIES/mL KLEBSIELLA PNEUMONIAE  CULTURE, BLOOD (ROUTINE X 2) w Reflex to ID Panel     Status: None   Collection Time: 10/28/15  5:12 PM  Result Value Ref Range Status   Specimen Description BLOOD RIGHT ARM  Final   Special Requests BOTTLES DRAWN AEROBIC AND ANAEROBIC 5CC  Final   Culture NO GROWTH 5 DAYS  Final   Report Status 11/02/2015 FINAL  Final  CULTURE, BLOOD (ROUTINE X 2) w Reflex to ID Panel     Status: None   Collection Time: 10/28/15  6:38 PM  Result Value Ref Range Status   Specimen Description BLOOD PERIPHERAL  Final   Special Requests BOTTLES DRAWN AEROBIC AND ANAEROBIC 5CC  Final   Culture NO GROWTH 5 DAYS  Final   Report Status 11/02/2015 FINAL  Final    RADIOLOGY:  Ct Abdomen Pelvis Wo Contrast  Result Date: 10/31/2015 CLINICAL DATA:   Left-sided flank pain since Monday. EXAM: CT ABDOMEN AND PELVIS WITHOUT CONTRAST TECHNIQUE: Multidetector CT imaging of the abdomen and pelvis was performed following the standard protocol without IV contrast. COMPARISON:  10/29/2012 FINDINGS: Lower chest: Tortuous aorta. Small pleural effusions mild basilar atelectasis. There is body wall edema but no interstitial thickening to suggest pulmonary edema. Hepatobiliary: No focal liver abnormality.No evidence of biliary obstruction or stone. Pancreas: Unremarkable. Spleen: Stable generous size without focal abnormality. Adrenals/Urinary Tract: Negative adrenals. Mild left hydroureter without visible obstruction. Perinephric stranding is symmetric. Left hilar calcifications are likely vascular. Unremarkable bladder. Stomach/Bowel: Status post gastric bypass. No bowel obstruction. Formed stool throughout the colon. Appendectomy. Vascular/Lymphatic: Diffuse arterial calcification. Chronic reactive appearing enlargement of deep liver drainage lymph nodes. Reproductive:Negative for age. Other: No ascites or pneumoperitoneum. Musculoskeletal: No acute abnormalities. Spondylosis and chronic endplate spurs.  IMPRESSION: 1. Mild left hydroureter without visible stone or other obstructive process. Correlate for signs of ascending infection. 2. Trace pleural effusions and mild basilar atelectasis. 3. Possible constipation. Electronically Signed   By: Marnee Spring M.D.   On: 10/31/2015 16:16     Management plans discussed with the patient, family and they are in agreement.  CODE STATUS:     Code Status Orders        Start     Ordered   10/28/15 1605  Full code  Continuous     10/28/15 1604    Code Status History    Date Active Date Inactive Code Status Order ID Comments User Context   06/03/2015  3:10 PM 06/05/2015  3:01 PM Full Code [id number]  Enedina Finner, MD Inpatient   04/17/2015  9:59 PM 04/18/2015  8:45 PM Full Code [id number]  Altamese Dilling, MD ED    02/15/2015  8:04 PM 02/20/2015  7:03 PM Full Code [id number]  Audery Amel, MD Inpatient   11/12/2014  8:33 AM 11/13/2014  2:49 PM Full Code [id number]  Marcina Millard, MD Inpatient   11/07/2014  9:53 PM 11/12/2014  8:33 AM Full Code [id number]  Houston Siren, MD Inpatient      TOTAL TIME TAKING CARE OF THIS PATIENT: 33 minutes.    Madoc Holquin,  Mardi Mainland.D on 11/02/2015 at 10:23 AM  Between 7am to 6pm - Pager - [phone]  After 6pm go to [email or web address] - Social research officer, government  [facility]  Office  [phone]  CC: Primary care physician; Mila Merry, MD

## 2015-11-02 NOTE — Consult Note (Signed)
Urology Consult  Referring physician: Dr. Lavetta Nielsen Reason for referral: Pyelonephritis, hydronephrosis  Chief Complaint: left flank pain  History of Present Illness: [name] is a 45yo with a hx of Stage 4 CKD, CVA, and DMII who was admitted with fatigue and left flank pain. She has a history of neurogenic bladder which was evaluated at [place] 1-2 years ago and the patient was instructed to perform intermittent catehterization. She stopped doing CIC due to infections and difficulty with cathetwerizing. Per pt she had urodynamics at [facility]. She was diagnosed with a klebsiella UTi during this hospitalization which is sensitive to the Rocephin she is currently prescribed. Here pain is dull, constant, moderate, nonradiating left flank pain. She has associated nausea. No exacerbating events. Pain alleviated with narcotics. CT scan shows an enlarge left kidney with mild hydroureteronephrosis to the level fo the bladder. Her bladder is distended on the CT scan. She has urgency without urge incontinence. She denies dysuria and hematuria Her sister has a history of VUR and duplicated system.  Past Medical History:  Diagnosis Date  . Anxiety   . Aortic arch aneurysm ([place])   . CAD (coronary artery disease)    a. cath 08/2003 nonobs dzs;  b. 11/2014: mLAD 85-90% s/p PCI/DES (Xience 3.25 x 23); c. cath 04/2015: 70% mD1 @ bifurcation. Patent LAD stent - > Med Rx.; d. Cath-PCI 5/17: Patent LAD stent, D1 now ~80% - PCI Resolute DES 2.25 x 12.    . Chronic systolic CHF (congestive heart failure) ([place])    a. echo 01/29/2013 EF 40-45%, basal anteroseptal & mid anteroseptal segments abnl; b echo 05/2012 EF 50-55%, mildly dilated LA, nl RVSP; c. 10/2015 Echo: EF 55-60%, mild conc LVH, no rwma.  . CKD (chronic kidney disease), stage IV ([place])   . Colon polyp   . CVA (cerebral vascular accident) ([place])    a. 06/2008-TEE nl LV fxn; b. 01/2013 - notes indicate short run of SVT at that time  . Depression   . Diabetes mellitus  without complication ([place])   . Gastric ulcer 04/27/2011  . GERD (gastroesophageal reflux disease)   . Headache    Migrains  . Hyperlipidemia   . Hypertensive heart disease   . Hypothyroidism   . Ischemic cardiomyopathy    a. EF prev 40-45% in 2014-->Improved to 55-60% (10/2015).  . Malignant melanoma of skin of scalp ([place])   . Morbid obesity ([place])    a. s/p gastric bypass.  . Multiple sclerosis ([place]) 2015  . Orthostatic hypotension   . Syncope    Past Surgical History:  Procedure Laterality Date  . APPENDECTOMY    . CARDIAC CATHETERIZATION N/A 11/09/2014   Procedure: Coronary Angiography;  Surgeon: Minna Merritts, MD;  Location: [place] [facility];  Service: Cardiovascular;  Laterality: N/A;  . CARDIAC CATHETERIZATION N/A 11/12/2014   Procedure: Coronary Stent Intervention;  Surgeon: Isaias Cowman, MD;  Location: [place] [facility];  Service: Cardiovascular;  Laterality: N/A;  . CARDIAC CATHETERIZATION N/A 04/18/2015   Procedure: Left Heart Cath and Coronary Angiography;  Surgeon: Minna Merritts, MD;  Location: [place] [facility];  Service: Cardiovascular;  Laterality: N/A;  . CARDIAC CATHETERIZATION Left 06/04/2015   Procedure: Left Heart Cath and Coronary Angiography;  Surgeon: Wellington Hampshire, MD;  Location: [place] [facility];  Service: Cardiovascular;  Laterality: Left;  . CARDIAC CATHETERIZATION N/A 06/04/2015   Procedure: Coronary Stent Intervention;  Surgeon: Wellington Hampshire, MD;  Location: [place] [facility];  Service:  Cardiovascular;  Laterality: N/A;  . DILATION AND CURETTAGE OF UTERUS    . ESOPHAGOGASTRODUODENOSCOPY (EGD) WITH PROPOFOL N/A 09/14/2014   Procedure: ESOPHAGOGASTRODUODENOSCOPY (EGD) WITH PROPOFOL;  Surgeon: Josefine Class, MD;  [facility];  Service: Endoscopy;  Laterality: N/A;  . GASTRIC BYPASS  09/2009   [facility]   . Left Carotid to sublcavian artery bypass w/ subclavian artery ligation     a. Performed  @ [place].  . MELANOMA EXCISION  2016   Dr. Evorn Gong  . RIGHT OOPHORECTOMY    . TRIGGER FINGER RELEASE    . TUBAL LIGATION     R tube removed still w/ Left    Medications: I have reviewed the patient's current medications. Allergies: No Known Allergies  Family History  Problem Relation Age of Onset  . Hypertension Mother   . Anxiety disorder Mother   . Depression Mother   . Bipolar disorder Mother   . Heart disease Mother   . Hyperlipidemia Mother   . Kidney disease Father   . Heart disease Father   . Hypertension Father   . Diabetes Father   . Stroke Father   . Colon cancer Father     dx in his 26's  . Anxiety disorder Father   . Depression Father   . Skin cancer Father   . Kidney disease Sister   . Thyroid nodules Sister   . Hypertension Sister   . Hypertension Sister   . Diabetes Sister   . Hyperlipidemia Sister   . Depression Sister    Social History:  reports that she quit smoking about 21 years ago. Her smoking use included Cigarettes. She has never used smokeless tobacco. She reports that she does not drink alcohol or use drugs.  Review of Systems  Gastrointestinal: Positive for nausea.  Genitourinary: Positive for flank pain and urgency.    Physical Exam:  Vital signs in last 24 hours: Temp:  [97.9 F (36.6 C)-98.6 F (37 C)] 98.6 F (37 C) (09/30 0520) Pulse Rate:  [64-78] 64 (09/30 0520) Resp:  [14-18] 18 (09/30 0520) BP: (132-148)/(64-71) 148/71 (09/30 0520) SpO2:  [94 %-97 %] 95 % (09/30 0520) Physical Exam  Constitutional: She is oriented to person, place, and time. She appears well-developed and well-nourished.  HENT:  Head: Normocephalic and atraumatic.  Eyes: EOM are normal. Pupils are equal, round, and reactive to light.  Neck: Normal range of motion. No thyromegaly present.  Cardiovascular: Normal rate and regular rhythm.   Respiratory: Effort normal. No respiratory distress.  GI: Soft. She exhibits no distension.  Musculoskeletal:  Normal range of motion. She exhibits no edema.  Neurological: She is alert and oriented to person, place, and time.  Skin: Skin is warm and dry.  Psychiatric: She has a normal mood and affect. Her behavior is normal. Judgment and thought content normal.    Laboratory Data:  Results for orders placed or performed during the hospital encounter of 10/28/15 (from the past 72 hour(s))  Glucose, capillary     Status: Abnormal   Collection Time: 10/30/15 11:19 AM  Result Value Ref Range   Glucose-Capillary 126 (H) 65 - 99 mg/dL   Comment 1 Notify RN    Comment 2 Document in Chart   Glucose, capillary     Status: Abnormal   Collection Time: 10/30/15  4:12 PM  Result Value Ref Range   Glucose-Capillary 164 (H) 65 - 99 mg/dL   Comment 1 Notify RN    Comment 2 Document  in Chart   Glucose, capillary     Status: Abnormal   Collection Time: 10/30/15  9:04 PM  Result Value Ref Range   Glucose-Capillary 306 (H) 65 - 99 mg/dL  Basic metabolic panel     Status: Abnormal   Collection Time: 10/31/15  3:25 AM  Result Value Ref Range   Sodium 135 135 - 145 mmol/L   Potassium 3.9 3.5 - 5.1 mmol/L   Chloride 108 101 - 111 mmol/L   CO2 21 (L) 22 - 32 mmol/L   Glucose, Bld 138 (H) 65 - 99 mg/dL   BUN 22 (H) 6 - 20 mg/dL   Creatinine, Ser 1.84 (H) 0.44 - 1.00 mg/dL   Calcium 7.9 (L) 8.9 - 10.3 mg/dL   GFR calc non Af Amer 32 (L) >60 mL/min   GFR calc Af Amer 37 (L) >60 mL/min    Comment: (NOTE) The eGFR has been calculated using the CKD EPI equation. This calculation has not been validated in all clinical situations. eGFR's persistently <60 mL/min signify possible Chronic Kidney Disease.    Anion gap 6 5 - 15  CBC     Status: Abnormal   Collection Time: 10/31/15  3:25 AM  Result Value Ref Range   WBC 8.5 3.6 - 11.0 K/uL   RBC 4.10 3.80 - 5.20 MIL/uL   Hemoglobin 11.0 (L) 12.0 - 16.0 g/dL   HCT 32.5 (L) 35.0 - 47.0 %   MCV 79.4 (L) 80.0 - 100.0 fL   MCH 26.9 26.0 - 34.0 pg   MCHC 33.9 32.0 -  36.0 g/dL   RDW 14.9 (H) 11.5 - 14.5 %   Platelets 138 (L) 150 - 440 K/uL  Glucose, capillary     Status: Abnormal   Collection Time: 10/31/15  7:21 AM  Result Value Ref Range   Glucose-Capillary 141 (H) 65 - 99 mg/dL  Glucose, capillary     Status: Abnormal   Collection Time: 10/31/15 10:57 AM  Result Value Ref Range   Glucose-Capillary 185 (H) 65 - 99 mg/dL  Glucose, capillary     Status: Abnormal   Collection Time: 10/31/15  4:59 PM  Result Value Ref Range   Glucose-Capillary 139 (H) 65 - 99 mg/dL  Glucose, capillary     Status: Abnormal   Collection Time: 10/31/15  9:09 PM  Result Value Ref Range   Glucose-Capillary 335 (H) 65 - 99 mg/dL  Basic metabolic panel     Status: Abnormal   Collection Time: 11/01/15  4:11 AM  Result Value Ref Range   Sodium 141 135 - 145 mmol/L   Potassium 4.2 3.5 - 5.1 mmol/L   Chloride 112 (H) 101 - 111 mmol/L   CO2 25 22 - 32 mmol/L   Glucose, Bld 84 65 - 99 mg/dL   BUN 19 6 - 20 mg/dL   Creatinine, Ser 1.72 (H) 0.44 - 1.00 mg/dL   Calcium 8.3 (L) 8.9 - 10.3 mg/dL   GFR calc non Af Amer 35 (L) >60 mL/min   GFR calc Af Amer 40 (L) >60 mL/min    Comment: (NOTE) The eGFR has been calculated using the CKD EPI equation. This calculation has not been validated in all clinical situations. eGFR's persistently <60 mL/min signify possible Chronic Kidney Disease.    Anion gap 4 (L) 5 - 15  Glucose, capillary     Status: Abnormal   Collection Time: 11/01/15  7:32 AM  Result Value Ref Range   Glucose-Capillary 122 (H) 65 -  99 mg/dL  Glucose, capillary     Status: Abnormal   Collection Time: 11/01/15 11:51 AM  Result Value Ref Range   Glucose-Capillary 173 (H) 65 - 99 mg/dL  Glucose, capillary     Status: Abnormal   Collection Time: 11/01/15  4:23 PM  Result Value Ref Range   Glucose-Capillary 183 (H) 65 - 99 mg/dL  Glucose, capillary     Status: Abnormal   Collection Time: 11/01/15  9:09 PM  Result Value Ref Range   Glucose-Capillary 154 (H)  65 - 99 mg/dL   Comment 1 Notify RN    Comment 2 Document in Chart   Glucose, capillary     Status: Abnormal   Collection Time: 11/02/15  7:59 AM  Result Value Ref Range   Glucose-Capillary 116 (H) 65 - 99 mg/dL   Recent Results (from the past 240 hour(s))  Urine culture     Status: Abnormal   Collection Time: 10/28/15  4:30 PM  Result Value Ref Range Status   Specimen Description URINE, CLEAN CATCH  Final   Special Requests Normal  Final   Culture >=100,000 COLONIES/mL KLEBSIELLA PNEUMONIAE (A)  Final   Report Status 10/31/2015 FINAL  Final   Organism ID, Bacteria KLEBSIELLA PNEUMONIAE (A)  Final      Susceptibility   Klebsiella pneumoniae - MIC*    AMPICILLIN >=32 RESISTANT Resistant     CEFAZOLIN <=4 SENSITIVE Sensitive     CEFTRIAXONE <=1 SENSITIVE Sensitive     CIPROFLOXACIN <=0.25 SENSITIVE Sensitive     GENTAMICIN <=1 SENSITIVE Sensitive     IMIPENEM <=0.25 SENSITIVE Sensitive     NITROFURANTOIN 64 INTERMEDIATE Intermediate     TRIMETH/SULFA >=320 RESISTANT Resistant     AMPICILLIN/SULBACTAM 4 SENSITIVE Sensitive     PIP/TAZO <=4 SENSITIVE Sensitive     Extended ESBL NEGATIVE Sensitive     * >=100,000 COLONIES/mL KLEBSIELLA PNEUMONIAE  CULTURE, BLOOD (ROUTINE X 2) w Reflex to ID Panel     Status: None   Collection Time: 10/28/15  5:12 PM  Result Value Ref Range Status   Specimen Description BLOOD RIGHT ARM  Final   Special Requests BOTTLES DRAWN AEROBIC AND ANAEROBIC 5CC  Final   Culture NO GROWTH 5 DAYS  Final   Report Status 11/02/2015 FINAL  Final  CULTURE, BLOOD (ROUTINE X 2) w Reflex to ID Panel     Status: None   Collection Time: 10/28/15  6:38 PM  Result Value Ref Range Status   Specimen Description BLOOD PERIPHERAL  Final   Special Requests BOTTLES DRAWN AEROBIC AND ANAEROBIC 5CC  Final   Culture NO GROWTH 5 DAYS  Final   Report Status 11/02/2015 FINAL  Final   Creatinine:  Recent Labs  10/28/15 0859 10/28/15 1712 10/29/15 0630 10/30/15 0400  10/31/15 0325 11/01/15 0411  CREATININE 2.32* 1.96* 2.13* 1.95* 1.84* 1.72*   Baseline Creatinine: 1.5  Impression/Assessment:  45yo with pyelonephritis, neurogenic bladder, mild left hydronephrosis  Plan:  1. Pyelonephritis: Please transition pt to Vantin 237m BID for 14 days.   2. Neurogenic bladder: I discussed treatment with the patient including CIC versus indwelling foley and the patient defers at this time. Pt istructed to performed timed voiding every 2 hours and double void.  3. Mild left hydronephrosis: The hydronephrosis is likely related to a distended bladder and poor bladder emptying. [facility] with a renal and bladder [place] [name] 11/02/2015, 9:45 AM

## 2015-11-02 NOTE — Progress Notes (Signed)
Discharge instructions and prescriptions given with verbalized understanding.  VSS.  IV removed per policy and procedure.  Patient taken to visitors entrance via wheelchair to be taken home in personal vehicle by husband.

## 2015-11-07 ENCOUNTER — Encounter: Payer: Self-pay | Admitting: Emergency Medicine

## 2015-11-07 ENCOUNTER — Ambulatory Visit: Payer: Self-pay | Admitting: Urology

## 2015-11-07 ENCOUNTER — Observation Stay
Admission: EM | Admit: 2015-11-07 | Discharge: 2015-11-08 | Disposition: A | Discharge status: Home / Self Care | Payer: BC Managed Care – PPO | Attending: Internal Medicine | Admitting: Internal Medicine

## 2015-11-07 DIAGNOSIS — I6529 Occlusion and stenosis of unspecified carotid artery: Secondary | ICD-10-CM | POA: Diagnosis not present

## 2015-11-07 DIAGNOSIS — I255 Ischemic cardiomyopathy: Secondary | ICD-10-CM | POA: Diagnosis not present

## 2015-11-07 DIAGNOSIS — I5022 Chronic systolic (congestive) heart failure: Secondary | ICD-10-CM | POA: Diagnosis not present

## 2015-11-07 DIAGNOSIS — R109 Unspecified abdominal pain: Secondary | ICD-10-CM | POA: Insufficient documentation

## 2015-11-07 DIAGNOSIS — I130 Hypertensive heart and chronic kidney disease with heart failure and stage 1 through stage 4 chronic kidney disease, or unspecified chronic kidney disease: Secondary | ICD-10-CM | POA: Diagnosis not present

## 2015-11-07 DIAGNOSIS — Z9884 Bariatric surgery status: Secondary | ICD-10-CM | POA: Insufficient documentation

## 2015-11-07 DIAGNOSIS — K219 Gastro-esophageal reflux disease without esophagitis: Secondary | ICD-10-CM | POA: Diagnosis not present

## 2015-11-07 DIAGNOSIS — F419 Anxiety disorder, unspecified: Secondary | ICD-10-CM | POA: Diagnosis not present

## 2015-11-07 DIAGNOSIS — Z87891 Personal history of nicotine dependence: Secondary | ICD-10-CM | POA: Insufficient documentation

## 2015-11-07 DIAGNOSIS — E860 Dehydration: Secondary | ICD-10-CM | POA: Insufficient documentation

## 2015-11-07 DIAGNOSIS — Z79899 Other long term (current) drug therapy: Secondary | ICD-10-CM | POA: Diagnosis not present

## 2015-11-07 DIAGNOSIS — I2510 Atherosclerotic heart disease of native coronary artery without angina pectoris: Secondary | ICD-10-CM | POA: Diagnosis not present

## 2015-11-07 DIAGNOSIS — Z8673 Personal history of transient ischemic attack (TIA), and cerebral infarction without residual deficits: Secondary | ICD-10-CM | POA: Insufficient documentation

## 2015-11-07 DIAGNOSIS — B961 Klebsiella pneumoniae [K. pneumoniae] as the cause of diseases classified elsewhere: Secondary | ICD-10-CM | POA: Insufficient documentation

## 2015-11-07 DIAGNOSIS — N12 Tubulo-interstitial nephritis, not specified as acute or chronic: Principal | ICD-10-CM | POA: Insufficient documentation

## 2015-11-07 DIAGNOSIS — N184 Chronic kidney disease, stage 4 (severe): Secondary | ICD-10-CM | POA: Diagnosis not present

## 2015-11-07 DIAGNOSIS — N390 Urinary tract infection, site not specified: Secondary | ICD-10-CM | POA: Insufficient documentation

## 2015-11-07 DIAGNOSIS — G8194 Hemiplegia, unspecified affecting left nondominant side: Secondary | ICD-10-CM | POA: Diagnosis not present

## 2015-11-07 DIAGNOSIS — G35 Multiple sclerosis: Secondary | ICD-10-CM | POA: Insufficient documentation

## 2015-11-07 DIAGNOSIS — Z7984 Long term (current) use of oral hypoglycemic drugs: Secondary | ICD-10-CM | POA: Insufficient documentation

## 2015-11-07 DIAGNOSIS — R112 Nausea with vomiting, unspecified: Secondary | ICD-10-CM | POA: Diagnosis present

## 2015-11-07 DIAGNOSIS — I252 Old myocardial infarction: Secondary | ICD-10-CM | POA: Insufficient documentation

## 2015-11-07 DIAGNOSIS — F329 Major depressive disorder, single episode, unspecified: Secondary | ICD-10-CM | POA: Diagnosis not present

## 2015-11-07 DIAGNOSIS — E039 Hypothyroidism, unspecified: Secondary | ICD-10-CM | POA: Diagnosis not present

## 2015-11-07 DIAGNOSIS — E785 Hyperlipidemia, unspecified: Secondary | ICD-10-CM | POA: Insufficient documentation

## 2015-11-07 DIAGNOSIS — G629 Polyneuropathy, unspecified: Secondary | ICD-10-CM | POA: Insufficient documentation

## 2015-11-07 DIAGNOSIS — N3000 Acute cystitis without hematuria: Secondary | ICD-10-CM

## 2015-11-07 DIAGNOSIS — Z7902 Long term (current) use of antithrombotics/antiplatelets: Secondary | ICD-10-CM | POA: Insufficient documentation

## 2015-11-07 DIAGNOSIS — E1122 Type 2 diabetes mellitus with diabetic chronic kidney disease: Secondary | ICD-10-CM | POA: Insufficient documentation

## 2015-11-07 DIAGNOSIS — Z955 Presence of coronary angioplasty implant and graft: Secondary | ICD-10-CM | POA: Insufficient documentation

## 2015-11-07 DIAGNOSIS — N319 Neuromuscular dysfunction of bladder, unspecified: Secondary | ICD-10-CM | POA: Insufficient documentation

## 2015-11-07 DIAGNOSIS — Z85820 Personal history of malignant melanoma of skin: Secondary | ICD-10-CM | POA: Insufficient documentation

## 2015-11-07 DIAGNOSIS — Z7982 Long term (current) use of aspirin: Secondary | ICD-10-CM | POA: Insufficient documentation

## 2015-11-07 LAB — CBC WITH DIFFERENTIAL/PLATELET
BASOS PCT: 1 %
Basophils Absolute: 0.1 10*3/uL (ref 0–0.1)
EOS ABS: 0.1 10*3/uL (ref 0–0.7)
Eosinophils Relative: 1 %
HEMATOCRIT: 42.3 % (ref 35.0–47.0)
HEMOGLOBIN: 14.2 g/dL (ref 12.0–16.0)
LYMPHS ABS: 2.4 10*3/uL (ref 1.0–3.6)
Lymphocytes Relative: 13 %
MCH: 26.4 pg (ref 26.0–34.0)
MCHC: 33.5 g/dL (ref 32.0–36.0)
MCV: 78.9 fL — AB (ref 80.0–100.0)
MONO ABS: 0.9 10*3/uL (ref 0.2–0.9)
MONOS PCT: 5 %
NEUTROS PCT: 80 %
Neutro Abs: 14.5 10*3/uL — ABNORMAL HIGH (ref 1.4–6.5)
PLATELETS: 312 10*3/uL (ref 150–440)
RBC: 5.36 MIL/uL — ABNORMAL HIGH (ref 3.80–5.20)
RDW: 14.8 % — ABNORMAL HIGH (ref 11.5–14.5)
WBC: 18.1 10*3/uL — ABNORMAL HIGH (ref 3.6–11.0)

## 2015-11-07 LAB — COMPREHENSIVE METABOLIC PANEL
ALBUMIN: 4 g/dL (ref 3.5–5.0)
ALT: 22 U/L (ref 14–54)
AST: 23 U/L (ref 15–41)
Alkaline Phosphatase: 144 U/L — ABNORMAL HIGH (ref 38–126)
Anion gap: 10 (ref 5–15)
BUN: 10 mg/dL (ref 6–20)
CHLORIDE: 98 mmol/L — AB (ref 101–111)
CO2: 30 mmol/L (ref 22–32)
CREATININE: 1.2 mg/dL — AB (ref 0.44–1.00)
Calcium: 9.1 mg/dL (ref 8.9–10.3)
GFR calc Af Amer: >60 mL/min (ref >60)
GFR, EST NON AFRICAN AMERICAN: 53 mL/min — AB (ref >60)
GLUCOSE: 180 mg/dL — AB (ref 65–99)
Potassium: 3 mmol/L — ABNORMAL LOW (ref 3.5–5.1)
Sodium: 138 mmol/L (ref 135–145)
Total Bilirubin: 0.4 mg/dL (ref 0.3–1.2)
Total Protein: 8.2 g/dL — ABNORMAL HIGH (ref 6.5–8.1)

## 2015-11-07 LAB — GLUCOSE, CAPILLARY
GLUCOSE-CAPILLARY: 116 mg/dL — AB (ref 65–99)
Glucose-Capillary: 168 mg/dL — ABNORMAL HIGH (ref 65–99)

## 2015-11-07 LAB — URINALYSIS COMPLETE WITH MICROSCOPIC (ARMC ONLY)
BACTERIA UA: NONE SEEN
Bilirubin Urine: NEGATIVE
Glucose, UA: >500 mg/dL — AB
Ketones, ur: NEGATIVE mg/dL
Nitrite: NEGATIVE
PH: 6 (ref 5.0–8.0)
PROTEIN: 100 mg/dL — AB
Specific Gravity, Urine: 1.015 (ref 1.005–1.030)

## 2015-11-07 LAB — LIPASE, BLOOD: LIPASE: 26 U/L (ref 11–51)

## 2015-11-07 MED ORDER — INSULIN ASPART 100 UNIT/ML ~~LOC~~ SOLN: 0.0000 [IU] | Freq: Every day | SUBCUTANEOUS | Status: DC

## 2015-11-07 MED ORDER — HYDRALAZINE HCL 20 MG/ML IJ SOLN
10.0000 mg | Freq: Four times a day (QID) | INTRAMUSCULAR | Status: DC | PRN
  Administered 2015-11-07 (×2): 10 mg via INTRAVENOUS
  Filled 2015-11-07: qty 1

## 2015-11-07 MED ORDER — NITROGLYCERIN 0.4 MG SL SUBL: 0.4000 mg | SUBLINGUAL_TABLET | SUBLINGUAL | Status: DC | PRN

## 2015-11-07 MED ORDER — PIPERACILLIN-TAZOBACTAM 3.375 G IVPB 30 MIN
3.3750 g | Freq: Once | INTRAVENOUS | Status: AC
  Administered 2015-11-07: 3.375 g via INTRAVENOUS
  Filled 2015-11-07: qty 50

## 2015-11-07 MED ORDER — ZOLPIDEM TARTRATE 5 MG PO TABS: 5.0000 mg | ORAL_TABLET | Freq: Every evening | ORAL | Status: DC | PRN

## 2015-11-07 MED ORDER — ONDANSETRON HCL 4 MG/2ML IJ SOLN
4.0000 mg | Freq: Four times a day (QID) | INTRAMUSCULAR | Status: DC | PRN
  Administered 2015-11-07 – 2015-11-08 (×2): 4 mg via INTRAVENOUS
  Filled 2015-11-07 (×2): qty 2

## 2015-11-07 MED ORDER — PANTOPRAZOLE SODIUM 40 MG PO TBEC
40.0000 mg | DELAYED_RELEASE_TABLET | Freq: Two times a day (BID) | ORAL | Status: DC
  Administered 2015-11-07 – 2015-11-08 (×2): 40 mg via ORAL
  Filled 2015-11-07 (×2): qty 1

## 2015-11-07 MED ORDER — LOSARTAN POTASSIUM 50 MG PO TABS
25.0000 mg | ORAL_TABLET | Freq: Every day | ORAL | Status: DC
  Administered 2015-11-07 – 2015-11-08 (×2): 25 mg via ORAL
  Filled 2015-11-07: qty 1

## 2015-11-07 MED ORDER — GABAPENTIN 300 MG PO CAPS
300.0000 mg | ORAL_CAPSULE | Freq: Three times a day (TID) | ORAL | Status: DC
  Administered 2015-11-07 – 2015-11-08 (×3): 300 mg via ORAL
  Filled 2015-11-07 (×3): qty 1

## 2015-11-07 MED ORDER — ASPIRIN EC 81 MG PO TBEC
81.0000 mg | DELAYED_RELEASE_TABLET | Freq: Every day | ORAL | Status: DC
  Administered 2015-11-08: 81 mg via ORAL
  Filled 2015-11-07: qty 1

## 2015-11-07 MED ORDER — CLOPIDOGREL BISULFATE 75 MG PO TABS
75.0000 mg | ORAL_TABLET | Freq: Every day | ORAL | Status: DC
  Administered 2015-11-08: 75 mg via ORAL
  Filled 2015-11-07: qty 1

## 2015-11-07 MED ORDER — TRAZODONE HCL 100 MG PO TABS
200.0000 mg | ORAL_TABLET | Freq: Every day | ORAL | Status: DC
  Administered 2015-11-07: 200 mg via ORAL
  Filled 2015-11-07: qty 2

## 2015-11-07 MED ORDER — SODIUM CHLORIDE 0.9 % IV SOLN
INTRAVENOUS | Status: DC
  Administered 2015-11-07 – 2015-11-08 (×2): via INTRAVENOUS

## 2015-11-07 MED ORDER — ENOXAPARIN SODIUM 40 MG/0.4ML ~~LOC~~ SOLN
40.0000 mg | SUBCUTANEOUS | Status: DC
  Administered 2015-11-07: 40 mg via SUBCUTANEOUS
  Filled 2015-11-07: qty 0.4

## 2015-11-07 MED ORDER — ONDANSETRON HCL 4 MG/2ML IJ SOLN
4.0000 mg | Freq: Once | INTRAMUSCULAR | Status: AC
  Administered 2015-11-07: 4 mg via INTRAVENOUS
  Filled 2015-11-07: qty 2

## 2015-11-07 MED ORDER — OXYCODONE HCL 5 MG PO TABS
5.0000 mg | ORAL_TABLET | Freq: Four times a day (QID) | ORAL | Status: DC | PRN
  Administered 2015-11-07: 5 mg via ORAL
  Filled 2015-11-07: qty 1

## 2015-11-07 MED ORDER — PROMETHAZINE HCL 25 MG PO TABS
25.0000 mg | ORAL_TABLET | Freq: Four times a day (QID) | ORAL | Status: DC | PRN
  Administered 2015-11-07: 25 mg via ORAL
  Filled 2015-11-07: qty 1

## 2015-11-07 MED ORDER — ISOSORBIDE MONONITRATE ER 60 MG PO TB24
60.0000 mg | ORAL_TABLET | Freq: Every day | ORAL | Status: DC
  Administered 2015-11-08: 60 mg via ORAL
  Filled 2015-11-07: qty 1

## 2015-11-07 MED ORDER — CITALOPRAM HYDROBROMIDE 20 MG PO TABS
40.0000 mg | ORAL_TABLET | Freq: Every day | ORAL | Status: DC
  Administered 2015-11-08: 40 mg via ORAL
  Filled 2015-11-07: qty 2

## 2015-11-07 MED ORDER — ROSUVASTATIN CALCIUM 20 MG PO TABS
40.0000 mg | ORAL_TABLET | Freq: Every day | ORAL | Status: DC
  Administered 2015-11-07: 40 mg via ORAL
  Filled 2015-11-07: qty 2

## 2015-11-07 MED ORDER — BACLOFEN 10 MG PO TABS: 10.0000 mg | ORAL_TABLET | Freq: Two times a day (BID) | ORAL | Status: DC | PRN

## 2015-11-07 MED ORDER — LOSARTAN POTASSIUM 50 MG PO TABS
ORAL_TABLET | ORAL | Status: AC
  Administered 2015-11-07: 25 mg via ORAL
  Filled 2015-11-07: qty 1

## 2015-11-07 MED ORDER — HYDRALAZINE HCL 20 MG/ML IJ SOLN
INTRAMUSCULAR | Status: AC
  Filled 2015-11-07: qty 1

## 2015-11-07 MED ORDER — LEVOTHYROXINE SODIUM 25 MCG PO TABS
25.0000 ug | ORAL_TABLET | Freq: Every day | ORAL | Status: DC
  Administered 2015-11-08: 25 ug via ORAL
  Filled 2015-11-07: qty 1

## 2015-11-07 MED ORDER — DILTIAZEM HCL ER 60 MG PO CP12
120.0000 mg | ORAL_CAPSULE | Freq: Every day | ORAL | Status: DC
  Administered 2015-11-08: 120 mg via ORAL
  Filled 2015-11-07: qty 2

## 2015-11-07 MED ORDER — ADULT MULTIVITAMIN W/MINERALS CH
1.0000 | ORAL_TABLET | Freq: Every day | ORAL | Status: DC
  Administered 2015-11-08: 1 via ORAL
  Filled 2015-11-07: qty 1

## 2015-11-07 MED ORDER — INSULIN ASPART 100 UNIT/ML ~~LOC~~ SOLN
0.0000 [IU] | Freq: Three times a day (TID) | SUBCUTANEOUS | Status: DC
  Administered 2015-11-08: 2 [IU] via SUBCUTANEOUS
  Filled 2015-11-07: qty 2

## 2015-11-07 MED ORDER — SODIUM CHLORIDE 0.9 % IV BOLUS (SEPSIS)
500.0000 mL | Freq: Once | INTRAVENOUS | Status: AC
  Administered 2015-11-07: 500 mL via INTRAVENOUS

## 2015-11-07 MED ORDER — PIPERACILLIN-TAZOBACTAM 3.375 G IVPB
3.3750 g | Freq: Three times a day (TID) | INTRAVENOUS | Status: DC
  Administered 2015-11-07 – 2015-11-08 (×2): 3.375 g via INTRAVENOUS
  Filled 2015-11-07 (×2): qty 50

## 2015-11-07 NOTE — ED Provider Notes (Signed)
[facility] Emergency Department Provider Note   ____________________________________________   First MD Initiated Contact with Patient 11/07/15 1012     (approximate)  I have reviewed the triage vital signs and the nursing notes.   HISTORY  Chief Complaint Emesis and Abdominal Pain    HPI [name] is a 45 y.o. female with multiple chronic medical problems including coronary artery disease, CHF, CKD and ischemic cardiomyopathy who presents for evaluation of subjective fevers and chills as well as dysuria and nausea and vomiting since last night, gradual onset, constant, severe, no modified factors. Patient was discharged from [facility] on 11/02/2015 after treatment for sepsis secondary to Klebsiella UTI, discharged with Cefpodoxime. She reports that since discharge she has not felt any better. She has not been able to empty her bladder at times. She also has a neurogenic bladder, at the time of discharge was offered a Foley catheter however she declined. She has not been intermittently catheterizing herself either because she says she wasn't told to do that. She denies any chest pain or difficulty breathing. No diarrhea.   Past Medical History:  Diagnosis Date  . Anxiety   . Aortic arch aneurysm ([place])   . CAD (coronary artery disease)    a. cath 08/2003 nonobs dzs;  b. 11/2014: mLAD 85-90% s/p PCI/DES (Xience 3.25 x 23); c. cath 04/2015: 70% mD1 @ bifurcation. Patent LAD stent - > Med Rx.; d. Cath-PCI 5/17: Patent LAD stent, D1 now ~80% - PCI Resolute DES 2.25 x 12.    . Chronic systolic CHF (congestive heart failure) ([place])    a. echo 01/29/2013 EF 40-45%, basal anteroseptal & mid anteroseptal segments abnl; b echo 05/2012 EF 50-55%, mildly dilated LA, nl RVSP; c. 10/2015 Echo: EF 55-60%, mild conc LVH, no rwma.  . CKD (chronic kidney disease), stage IV ([place])   . Colon polyp   . CVA (cerebral vascular accident) ([place])    a.  06/2008-TEE nl LV fxn; b. 01/2013 - notes indicate short run of SVT at that time  . Depression   . Diabetes mellitus without complication ([place])   . Gastric ulcer 04/27/2011  . GERD (gastroesophageal reflux disease)   . Headache    Migrains  . Hyperlipidemia   . Hypertensive heart disease   . Hypothyroidism   . Ischemic cardiomyopathy    a. EF prev 40-45% in 2014-->Improved to 55-60% (10/2015).  . Malignant melanoma of skin of scalp ([place])   . Morbid obesity ([place])    a. s/p gastric bypass.  . Multiple sclerosis ([place]) 2015  . Orthostatic hypotension   . Syncope     Patient Active Problem List   Diagnosis Date Noted  . Nausea & vomiting 11/07/2015  . Sepsis ([place]) 10/30/2015  . Acute pyelonephritis   . Acute renal insufficiency   . Diaphoresis   . Emesis   . CKD (chronic kidney disease), stage IV ([place])   . CAD (coronary artery disease)   . Hypertensive heart disease   . CKD (chronic kidney disease) stage 3, GFR 30-59 ml/min 06/05/2015  . Presence of drug coated stent in LAD coronary artery & now in D1 06/04/2015  . MI, acute, non ST segment elevation ([place])   . Coronary artery disease involving native coronary artery of native heart with angina pectoris with documented spasm ([place])   . Uncontrolled type 2 diabetes mellitus with circulatory disorder ([place])   . NSTEMI (non-ST elevated myocardial infarction) ([place]) 06/03/2015  . Colitis  06/03/2015  . Carotid stenosis 04/30/2015  . Type 2 diabetes mellitus with complication, without long-term current use of insulin ([place])   . Pain in the chest   . UTI (lower urinary tract infection) 04/05/2015  . Iron deficiency anemia 03/22/2015  . Depression 02/22/2015  . Diabetes type 2, controlled ([place]) 02/19/2015  . Vitamin B12 deficiency 02/18/2015  . Non compliance w medication regimen 02/18/2015  . Misuse of medications for pain 02/18/2015  . Chest pain 02/16/2015  . Severe recurrent major depression without psychotic features ([place]) 02/15/2015   . Helicobacter pylori infection 11/23/2014  . Hemiparesis, left ([place]) 11/23/2014  . Benign neoplasm of colon 11/20/2014  . Coronary artery disease involving native coronary artery of native heart with unstable angina pectoris ([place])   . Malignant melanoma ([name]) 08/25/2014  . Chronic kidney disease (CKD), stage III (moderate) 08/25/2014  . Chronic systolic CHF (congestive heart failure) ([place])   . Incomplete bladder emptying 07/12/2014  . Adult hypothyroidism 12/30/2013  . Aberrant subclavian artery 11/17/2013  . Multiple sclerosis ([place]) 11/02/2013  . Cerebrovascular accident (CVA) ([place]) 06/20/2013  . Headache, migraine 05/29/2013  . Diabetes mellitus ([place]) 05/29/2013  . Type 2 diabetes mellitus ([place]) 12/12/2012  . Hyperlipidemia   . GERD (gastroesophageal reflux disease)   . Neuropathy ([place]) 01/02/2011  . CVA (cerebral vascular accident) ([place]) 06/21/2008  . Essential hypertension 05/01/2008    Past Surgical History:  Procedure Laterality Date  . APPENDECTOMY    . CARDIAC CATHETERIZATION N/A 11/09/2014   Procedure: Coronary Angiography;  Surgeon: Minna Merritts, MD;  Location: [place] [facility];  Service: Cardiovascular;  Laterality: N/A;  . CARDIAC CATHETERIZATION N/A 11/12/2014   Procedure: Coronary Stent Intervention;  Surgeon: Isaias Cowman, MD;  Location: [place] [facility];  Service: Cardiovascular;  Laterality: N/A;  . CARDIAC CATHETERIZATION N/A 04/18/2015   Procedure: Left Heart Cath and Coronary Angiography;  Surgeon: Minna Merritts, MD;  Location: [place] [facility];  Service: Cardiovascular;  Laterality: N/A;  . CARDIAC CATHETERIZATION Left 06/04/2015   Procedure: Left Heart Cath and Coronary Angiography;  Surgeon: Wellington Hampshire, MD;  Location: [place] [facility];  Service: Cardiovascular;  Laterality: Left;  . CARDIAC CATHETERIZATION N/A 06/04/2015   Procedure: Coronary Stent Intervention;  Surgeon: Wellington Hampshire, MD;  Location: [place] [facility];   Service: Cardiovascular;  Laterality: N/A;  . DILATION AND CURETTAGE OF UTERUS    . ESOPHAGOGASTRODUODENOSCOPY (EGD) WITH PROPOFOL N/A 09/14/2014   Procedure: ESOPHAGOGASTRODUODENOSCOPY (EGD) WITH PROPOFOL;  Surgeon: Josefine Class, MD;  [facility] ENDOSCOPY;  Service: Endoscopy;  Laterality: N/A;  . GASTRIC BYPASS  09/2009   [facility]   . Left Carotid to sublcavian artery bypass w/ subclavian artery ligation     a. Performed @ [place].  . MELANOMA EXCISION  2016   Dr. Evorn Gong  . RIGHT OOPHORECTOMY    . TRIGGER FINGER RELEASE    . TUBAL LIGATION     R tube removed still w/ Left    Prior to Admission medications   Medication Sig Start Date End Date Taking? Authorizing Provider  aspirin EC 81 MG tablet Take 1 tablet (81 mg total) by mouth daily. 02/19/15  Yes Hildred Priest, MD  baclofen (LIORESAL) 10 MG tablet Take 1 tablet (10 mg total) by mouth 2 (two) times daily as needed for muscle spasms. 02/19/15  Yes Hildred Priest, MD  cefpodoxime (VANTIN) 200 MG tablet Take 1 tablet (200 mg total) by mouth 2 (two) times  daily. 11/02/15 11/16/15 Yes Lytle Butte, MD  citalopram (CELEXA) 40 MG tablet Take 40 mg by mouth daily.   Yes Historical Provider, MD  clopidogrel (PLAVIX) 75 MG tablet Take 75 mg by mouth daily.   Yes Historical Provider, MD  diltiazem (CARDIZEM SR) 120 MG 12 hr capsule Take 120 mg by mouth daily.   Yes Historical Provider, MD  eszopiclone (LUNESTA) 2 MG TABS tablet Take 1 tablet (2 mg total) by mouth at bedtime as needed for sleep. Take immediately before bedtime 08/14/15  Yes [name], MD  furosemide (LASIX) 20 MG tablet Take 20 mg by mouth daily.   Yes Historical Provider, MD  gabapentin (NEURONTIN) 300 MG capsule Take 300 mg by mouth 3 (three) times daily.   Yes Historical Provider, MD  isosorbide mononitrate (IMDUR) 60 MG 24 hr tablet Take 60 mg by mouth daily.   Yes Historical Provider, MD  levothyroxine (SYNTHROID,  LEVOTHROID) 25 MCG tablet Take 1 tablet (25 mcg total) by mouth daily before breakfast. 04/18/15  Yes Gladstone Lighter, MD  losartan (COZAAR) 25 MG tablet Take 25 mg by mouth daily. 05/05/15  Yes Historical Provider, MD  metFORMIN (GLUCOPHAGE) 1000 MG tablet Take 1,000 mg by mouth 2 (two) times daily with a meal.  05/09/15  Yes Historical Provider, MD  Multiple Vitamin (MULTIVITAMIN WITH MINERALS) TABS tablet Take 1 tablet by mouth daily.   Yes Historical Provider, MD  nitroGLYCERIN (NITROSTAT) 0.4 MG SL tablet Place 1 tablet (0.4 mg total) under the tongue every 5 (five) minutes as needed for chest pain. 04/30/15  Yes Minna Merritts, MD  oxyCODONE (OXY IR/ROXICODONE) 5 MG immediate release tablet Take 1 tablet (5 mg total) by mouth every 6 (six) hours as needed for moderate pain. 11/02/15  Yes Lytle Butte, MD  pantoprazole (PROTONIX) 40 MG tablet Take 1 tablet (40 mg total) by mouth 2 (two) times daily. 04/18/15  Yes Gladstone Lighter, MD  promethazine (PHENERGAN) 25 MG tablet Take 25 mg by mouth every 6 (six) hours as needed for nausea or vomiting. Reported on 05/31/2015   Yes Historical Provider, MD  rosuvastatin (CRESTOR) 40 MG tablet Take 1 tablet (40 mg total) by mouth daily at 6 PM. 04/30/15  Yes Minna Merritts, MD  traZODone (DESYREL) 100 MG tablet Take 200 mg by mouth at bedtime.   Yes Historical Provider, MD    Allergies Review of patient's allergies indicates no known allergies.  Family History  Problem Relation Age of Onset  . Hypertension Mother   . Anxiety disorder Mother   . Depression Mother   . Bipolar disorder Mother   . Heart disease Mother   . Hyperlipidemia Mother   . Kidney disease Father   . Heart disease Father   . Hypertension Father   . Diabetes Father   . Stroke Father   . Colon cancer Father     dx in his 70's  . Anxiety disorder Father   . Depression Father   . Skin cancer Father   . Kidney disease Sister   . Thyroid nodules Sister   . Hypertension Sister    . Hypertension Sister   . Diabetes Sister   . Hyperlipidemia Sister   . Depression Sister     Social History Social History  Substance Use Topics  . Smoking status: Former Smoker    Types: Cigarettes    Quit date: 08/31/1994  . Smokeless tobacco: Never Used     Comment: quit 28 years ago  .  Alcohol use No    Review of Systems Constitutional: + fever/chills Eyes: No visual changes. ENT: No sore throat. Cardiovascular: Denies chest pain. Respiratory: Denies shortness of breath. Gastrointestinal: No abdominal pain.  + nausea, + vomiting.  No diarrhea.  No constipation. Genitourinary: Negative for dysuria. Musculoskeletal: Negative for back pain. Skin: Negative for rash. Neurological: Negative for headaches, focal weakness or numbness.  10-point ROS otherwise negative.  ____________________________________________   PHYSICAL EXAM:  VITAL SIGNS: ED Triage Vitals  Enc Vitals Group     BP 11/07/15 1002 (!) 192/111     Pulse Rate 11/07/15 1002 89     Resp 11/07/15 1002 18     Temp 11/07/15 1002 97.6 F (36.4 C)     Temp Source 11/07/15 1002 Oral     SpO2 11/07/15 1002 100 %     Weight 11/07/15 1003 172 lb (78 kg)     Height 11/07/15 1003 5\' 2"  (1.575 m)     Head Circumference --      Peak Flow --      Pain Score 11/07/15 1003 8     Pain Loc --      Pain Edu? --      Excl. in [place]? --     Constitutional: Alert and oriented. Nontoxic appearing and in no acute distress. Eyes: Conjunctivae are normal. PERRL. EOMI. Head: Atraumatic. Nose: No congestion/rhinnorhea. Mouth/Throat: Mucous membranes are moist.  Oropharynx non-erythematous. Neck: No stridor. Supple without meningismus. Cardiovascular: Normal rate, regular rhythm. Grossly normal heart sounds.  Good peripheral circulation. Respiratory: Normal respiratory effort.  No retractions. Lungs CTAB. Gastrointestinal: Soft and nontender. No distention. Normal bowel sounds. No CVA tenderness. Genitourinary:  deferred Musculoskeletal: No lower extremity tenderness nor edema.  No joint effusions. Neurologic:  Normal speech and language. No gross focal neurologic deficits are appreciated. No gait instability. Skin:  Skin is warm, dry and intact. No rash noted. Psychiatric: Mood and affect are normal. Speech and behavior are normal.  ____________________________________________   LABS (all labs ordered are listed, but only abnormal results are displayed)  Labs Reviewed  CBC WITH DIFFERENTIAL/PLATELET - Abnormal; Notable for the following:       Result Value   WBC 18.1 (*)    RBC 5.36 (*)    MCV 78.9 (*)    RDW 14.8 (*)    Neutro Abs 14.5 (*)    All other components within normal limits  COMPREHENSIVE METABOLIC PANEL - Abnormal; Notable for the following:    Potassium 3.0 (*)    Chloride 98 (*)    Glucose, Bld 180 (*)    Creatinine, Ser 1.20 (*)    Total Protein 8.2 (*)    Alkaline Phosphatase 144 (*)    GFR calc non Af Amer 53 (*)    All other components within normal limits  URINALYSIS COMPLETEWITH MICROSCOPIC ([facility] ONLY) - Abnormal; Notable for the following:    Color, Urine YELLOW (*)    APPearance HAZY (*)    Glucose, UA >500 (*)    Hgb urine dipstick 2+ (*)    Protein, ur 100 (*)    Leukocytes, UA TRACE (*)    Squamous Epithelial / LPF 0-5 (*)    All other components within normal limits  CULTURE, BLOOD (ROUTINE X 2)  CULTURE, BLOOD (ROUTINE X 2)  URINE CULTURE  LIPASE, BLOOD   ____________________________________________  EKG  none ____________________________________________  RADIOLOGY  none ____________________________________________   PROCEDURES  Procedure(s) performed: None  Procedures  Critical Care performed: No  ____________________________________________   INITIAL IMPRESSION / ASSESSMENT AND PLAN / ED COURSE  Pertinent labs & imaging results that were available during my care of the patient were reviewed by me and considered in my medical  decision making (see chart for details).  [name] is a 45 y.o. female with multiple chronic medical problems including coronary artery disease, CHF, CKD and ischemic cardiomyopathy who presents for evaluation of subjective fevers and chills as well as dysuria and nausea and vomiting since last night. On exam, she is nontoxic appearing and in no acute distress. Her vital signs are stable she is afebrile. She is benign abdominal exam. Plan for screening labs, repeat urinalysis, we'll treat her symptomatic only with fluids and antibiotics. Reassess for disposition.  ----------------------------------------- 11:53 AM on 11/07/2015 ----------------------------------------- White blood count IS again elevated at 18,000. Urinalysis is concerning for persistent urinary tract infection which is likely failed outpatient management with Cefpodoxime. Previously, cultures sensitive to Zosyn which I ordered. CMP shows mild hypokalemia or case discussed with hospitalist for admission.   Clinical Course     ____________________________________________   FINAL CLINICAL IMPRESSION(S) / ED DIAGNOSES  Final diagnoses:  Acute cystitis without hematuria  Nausea and vomiting, intractability of vomiting not specified, unspecified vomiting type      NEW MEDICATIONS STARTED DURING THIS VISIT:  New Prescriptions   No medications on file     Note:  This document was prepared using Dragon voice recognition software and may include unintentional dictation errors.    Joanne Gavel, MD 11/07/15 [phone]

## 2015-11-07 NOTE — Progress Notes (Signed)
Pt.'s current manual BP is 168/90 after the administration of 10 mg IV hydralazine given at 1538 for a BP of 198/98 at 1522. Pt is currently moderately sweating, facial flush, and is still having a headache after the administration of 5 mg of oxycodone PRN. Dr. Edwina Barth was notified of pt.'s current condition, no new orders given at this time. Will continue to monitor pt closely.   [name]

## 2015-11-07 NOTE — ED Triage Notes (Signed)
Pt presents to ED with reports of lower abdominal pain and vomiting since last night. Pt states recently admitted and discharge on 11/02/15 for UTI, E. Coli infection and kidney problem. Pt states she has been feeling bad since discharge.

## 2015-11-07 NOTE — Progress Notes (Signed)
Pt stated that "I am going to pull this catheter out if you do not take it out." Dr. Edwina Barth was notified of pt statement. New order to remove foley. Pt has until 2300 11/07/2015 to void.   [name]

## 2015-11-07 NOTE — Progress Notes (Signed)
Pharmacy Antibiotic Note  STUART GUILLEN [name] is a 45 y.o. female admitted on 11/07/2015 with UTI.  Pharmacy has been consulted for Zosyn dosing.  *(Patient just d/ced 11/02/15 from [facility]- here with pyelonephritis then- Ucx: Klebsieelapneumo + on that visit. Received Ceftriaxone and discharged on Cefpodoxime)  Plan: Zosyn 3.375g IV q8h (4 hour infusion).    Height: 5\' 2"  (157.5 cm) Weight: 172 lb (78 kg) IBW/kg (Calculated) : 50.1  Temp (24hrs), Avg:97.6 F (36.4 C), Min:97.6 F (36.4 C), Max:97.6 F (36.4 C)   Recent Labs Lab 11/01/15 0411 11/07/15 1013  WBC  --  18.1*  CREATININE 1.72* 1.20*    Estimated Creatinine Clearance: 56.1 mL/min (by C-G formula based on SCr of 1.2 mg/dL (H)).    No Known Allergies  Antimicrobials this admission: Zosyn 10/5 >>       >>    Dose adjustments this admission:    Microbiology results:  10/5  BCx:  Pending UA: trace LE, TNTC WBC, no bac.  10/5 UCx: pending    Sputum:      MRSA PCR:    Thank you for allowing pharmacy to be a part of this patient's care.  Drucella Karbowski A 11/07/2015 2:14 PM

## 2015-11-07 NOTE — H&P (Signed)
[name] is an 45 y.o. female.   Chief Complaint: Nausea/Vomiting HPI: Discharged 4 days ago following admission for UTI and acute renal failure. Cultures then grew out Klebsiella. Prescribed cefpodoxime at discharge which it should have been sensative to. Not feeling good since. Started having nausea/vomiting and fever yesterday. Unable to keep meds down.   Past Medical History:  Diagnosis Date  . Anxiety   . Aortic arch aneurysm ([place])   . CAD (coronary artery disease)    a. cath 08/2003 nonobs dzs;  b. 11/2014: mLAD 85-90% s/p PCI/DES (Xience 3.25 x 23); c. cath 04/2015: 70% mD1 @ bifurcation. Patent LAD stent - > Med Rx.; d. Cath-PCI 5/17: Patent LAD stent, D1 now ~80% - PCI Resolute DES 2.25 x 12.    . Chronic systolic CHF (congestive heart failure) ([place])    a. echo 01/29/2013 EF 40-45%, basal anteroseptal & mid anteroseptal segments abnl; b echo 05/2012 EF 50-55%, mildly dilated LA, nl RVSP; c. 10/2015 Echo: EF 55-60%, mild conc LVH, no rwma.  . CKD (chronic kidney disease), stage IV ([name])   . Colon polyp   . CVA (cerebral vascular accident) ([place])    a. 06/2008-TEE nl LV fxn; b. 01/2013 - notes indicate short run of SVT at that time  . Depression   . Diabetes mellitus without complication ([place])   . Gastric ulcer 04/27/2011  . GERD (gastroesophageal reflux disease)   . Headache    Migrains  . Hyperlipidemia   . Hypertensive heart disease   . Hypothyroidism   . Ischemic cardiomyopathy    a. EF prev 40-45% in 2014-->Improved to 55-60% (10/2015).  . Malignant melanoma of skin of scalp ([place])   . Morbid obesity ([place])    a. s/p gastric bypass.  . Multiple sclerosis ([place]) 2015  . Orthostatic hypotension   . Syncope     Past Surgical History:  Procedure Laterality Date  . APPENDECTOMY    . CARDIAC CATHETERIZATION N/A 11/09/2014   Procedure: Coronary Angiography;  Surgeon: Minna Merritts, MD;  Location: [facility];  Service: Cardiovascular;  Laterality: N/A;  .  CARDIAC CATHETERIZATION N/A 11/12/2014   Procedure: Coronary Stent Intervention;  Surgeon: Isaias Cowman, MD;  Location: [facility];  Service: Cardiovascular;  Laterality: N/A;  . CARDIAC CATHETERIZATION N/A 04/18/2015   Procedure: Left Heart Cath and Coronary Angiography;  Surgeon: Minna Merritts, MD;  Location: [facility];  Service: Cardiovascular;  Laterality: N/A;  . CARDIAC CATHETERIZATION Left 06/04/2015   Procedure: Left Heart Cath and Coronary Angiography;  Surgeon: Wellington Hampshire, MD;  Location: [place] [facility];  Service: Cardiovascular;  Laterality: Left;  . CARDIAC CATHETERIZATION N/A 06/04/2015   Procedure: Coronary Stent Intervention;  Surgeon: Wellington Hampshire, MD;  Location: [facility];  Service: Cardiovascular;  Laterality: N/A;  . DILATION AND CURETTAGE OF UTERUS    . ESOPHAGOGASTRODUODENOSCOPY (EGD) WITH PROPOFOL N/A 09/14/2014   Procedure: ESOPHAGOGASTRODUODENOSCOPY (EGD) WITH PROPOFOL;  Surgeon: Josefine Class, MD;  Location: [facility];  Service: Endoscopy;  Laterality: N/A;  . GASTRIC BYPASS  09/2009   [facility]   . Left Carotid to sublcavian artery bypass w/ subclavian artery ligation     a. Performed @ [facility].  . MELANOMA EXCISION  2016   Dr. Evorn Gong  . RIGHT OOPHORECTOMY    . TRIGGER FINGER RELEASE    . TUBAL LIGATION     R tube removed still w/ Left  Family History  Problem Relation Age of Onset  . Hypertension Mother   . Anxiety disorder Mother   . Depression Mother   . Bipolar disorder Mother   . Heart disease Mother   . Hyperlipidemia Mother   . Kidney disease Father   . Heart disease Father   . Hypertension Father   . Diabetes Father   . Stroke Father   . Colon cancer Father     dx in his 48's  . Anxiety disorder Father   . Depression Father   . Skin cancer Father   . Kidney disease Sister   . Thyroid nodules Sister   . Hypertension Sister   . Hypertension Sister   . Diabetes  Sister   . Hyperlipidemia Sister   . Depression Sister    Social History:  reports that she quit smoking about 21 years ago. Her smoking use included Cigarettes. She has never used smokeless tobacco. She reports that she does not drink alcohol or use drugs.  Allergies: No Known Allergies   (Not in a hospital admission)  Results for orders placed or performed during the hospital encounter of 11/07/15 (from the past 48 hour(s))  CBC with Differential     Status: Abnormal   Collection Time: 11/07/15 10:13 AM  Result Value Ref Range   WBC 18.1 (H) 3.6 - 11.0 K/uL   RBC 5.36 (H) 3.80 - 5.20 MIL/uL   Hemoglobin 14.2 12.0 - 16.0 g/dL   HCT 42.3 35.0 - 47.0 %   MCV 78.9 (L) 80.0 - 100.0 fL   MCH 26.4 26.0 - 34.0 pg   MCHC 33.5 32.0 - 36.0 g/dL   RDW 14.8 (H) 11.5 - 14.5 %   Platelets 312 150 - 440 K/uL   Neutrophils Relative % 80 %   Neutro Abs 14.5 (H) 1.4 - 6.5 K/uL   Lymphocytes Relative 13 %   Lymphs Abs 2.4 1.0 - 3.6 K/uL   Monocytes Relative 5 %   Monocytes Absolute 0.9 0.2 - 0.9 K/uL   Eosinophils Relative 1 %   Eosinophils Absolute 0.1 0 - 0.7 K/uL   Basophils Relative 1 %   Basophils Absolute 0.1 0 - 0.1 K/uL  Comprehensive metabolic panel     Status: Abnormal   Collection Time: 11/07/15 10:13 AM  Result Value Ref Range   Sodium 138 135 - 145 mmol/L   Potassium 3.0 (L) 3.5 - 5.1 mmol/L   Chloride 98 (L) 101 - 111 mmol/L   CO2 30 22 - 32 mmol/L   Glucose, Bld 180 (H) 65 - 99 mg/dL   BUN 10 6 - 20 mg/dL   Creatinine, Ser 1.20 (H) 0.44 - 1.00 mg/dL   Calcium 9.1 8.9 - 10.3 mg/dL   Total Protein 8.2 (H) 6.5 - 8.1 g/dL   Albumin 4.0 3.5 - 5.0 g/dL   AST 23 15 - 41 U/L   ALT 22 14 - 54 U/L   Alkaline Phosphatase 144 (H) 38 - 126 U/L   Total Bilirubin 0.4 0.3 - 1.2 mg/dL   GFR calc non Af Amer 53 (L) >60 mL/min   GFR calc Af Amer >60 >60 mL/min    Comment: (NOTE) The eGFR has been calculated using the CKD EPI equation. This calculation has not been validated in all  clinical situations. eGFR's persistently <60 mL/min signify possible Chronic Kidney Disease.    Anion gap 10 5 - 15  Lipase, blood     Status: None   Collection Time: 11/07/15  10:13 AM  Result Value Ref Range   Lipase 26 11 - 51 U/L  Urinalysis complete, with microscopic ([facility] only)     Status: Abnormal   Collection Time: 11/07/15 10:13 AM  Result Value Ref Range   Color, Urine YELLOW (A) YELLOW   APPearance HAZY (A) CLEAR   Glucose, UA >500 (A) NEGATIVE mg/dL   Bilirubin Urine NEGATIVE NEGATIVE   Ketones, ur NEGATIVE NEGATIVE mg/dL   Specific Gravity, Urine 1.015 1.005 - 1.030   Hgb urine dipstick 2+ (A) NEGATIVE   pH 6.0 5.0 - 8.0   Protein, ur 100 (A) NEGATIVE mg/dL   Nitrite NEGATIVE NEGATIVE   Leukocytes, UA TRACE (A) NEGATIVE   RBC / HPF 6-30 0 - 5 RBC/hpf   WBC, UA TOO NUMEROUS TO COUNT 0 - 5 WBC/hpf   Bacteria, UA NONE SEEN NONE SEEN   Squamous Epithelial / LPF 0-5 (A) NONE SEEN   Mucous PRESENT    No results found.  Review of Systems  Constitutional: Positive for chills and fever.  HENT: Negative for hearing loss.   Eyes: Negative for blurred vision.  Respiratory: Negative for cough and shortness of breath.   Cardiovascular: Negative for chest pain.  Gastrointestinal: Positive for abdominal pain, nausea and vomiting.  Genitourinary: Positive for dysuria.  Musculoskeletal: Negative for joint pain.  Skin: Negative for rash.  Neurological: Negative for dizziness.    Blood pressure (!) 192/111, pulse 89, temperature 97.6 F (36.4 C), temperature source Oral, resp. rate 18, height _0  (1.575 m), weight 78 kg (172 lb), SpO2 100 %. Physical Exam  Constitutional: She is oriented to person, place, and time. She appears well-developed.  Ill appearing.  HENT:  Head: Normocephalic and atraumatic.  Mouth/Throat: Oropharynx is clear and moist.  Eyes: Pupils are equal, round, and reactive to light.  Neck: Neck supple. No JVD present. No tracheal deviation present. No  thyromegaly present.  Cardiovascular: Normal rate and regular rhythm.   Respiratory: Breath sounds normal. No respiratory distress. She has no wheezes. She exhibits no tenderness.  GI: Soft. Bowel sounds are normal.  Mild suprapubic tenderness with no rebound or guarding.  Musculoskeletal: Normal range of motion. She exhibits no edema.  Lymphadenopathy:    She has no cervical adenopathy.  Neurological: She is alert and oriented to person, place, and time.  Skin: No rash noted.     Assessment/Plan 1. Intractable Nausea and vomiting: Will give IV fluids and antiemetics. Will change abx to IV. 2. Dehydration: Secondary to above. IV fluids. 3. UTI: Will change to IV abx. Has failed outpatient treatment due to inability to keep medication down because of vomiting. 4. Abdominal Pain: Likely from bladder irritation.  Time spent= 35 min  Baxter Hire, MD 11/07/2015, 2:04 PM

## 2015-11-07 NOTE — Progress Notes (Signed)
Pt arrived on 2C. Pt. BP manually was 198/98, pt complains of a headache that has been present since last night. Dr. Edwina Barth was notified of pt.'s manual BP, new orders to place order of Hydralazine IV PRN Q6 for SBP >160. Will monitor pt closely.  Will reasses pt BP after administration.   [name]

## 2015-11-07 NOTE — ED Notes (Signed)
MD made aware of pts BP, orders received to give pts Losartan and Imdur. Continue to monitor

## 2015-11-08 ENCOUNTER — Encounter: Payer: Self-pay | Admitting: Urology

## 2015-11-08 ENCOUNTER — Telehealth: Payer: Self-pay | Admitting: Family Medicine

## 2015-11-08 LAB — BASIC METABOLIC PANEL
Anion gap: 11 (ref 5–15)
BUN: 8 mg/dL (ref 6–20)
CHLORIDE: 106 mmol/L (ref 101–111)
CO2: 22 mmol/L (ref 22–32)
CREATININE: 1.31 mg/dL — AB (ref 0.44–1.00)
Calcium: 8.3 mg/dL — ABNORMAL LOW (ref 8.9–10.3)
GFR calc Af Amer: 55 mL/min — ABNORMAL LOW (ref >60)
GFR calc non Af Amer: 48 mL/min — ABNORMAL LOW (ref >60)
GLUCOSE: 107 mg/dL — AB (ref 65–99)
Potassium: 3.7 mmol/L (ref 3.5–5.1)
Sodium: 139 mmol/L (ref 135–145)

## 2015-11-08 LAB — URINE CULTURE

## 2015-11-08 LAB — CBC
HEMATOCRIT: 38.5 % (ref 35.0–47.0)
Hemoglobin: 12.7 g/dL (ref 12.0–16.0)
MCH: 26.3 pg (ref 26.0–34.0)
MCHC: 33.1 g/dL (ref 32.0–36.0)
MCV: 79.4 fL — AB (ref 80.0–100.0)
Platelets: 231 10*3/uL (ref 150–440)
RBC: 4.84 MIL/uL (ref 3.80–5.20)
RDW: 14.7 % — AB (ref 11.5–14.5)
WBC: 14 10*3/uL — ABNORMAL HIGH (ref 3.6–11.0)

## 2015-11-08 LAB — GLUCOSE, CAPILLARY
GLUCOSE-CAPILLARY: 192 mg/dL — AB (ref 65–99)
Glucose-Capillary: 100 mg/dL — ABNORMAL HIGH (ref 65–99)

## 2015-11-08 LAB — MAGNESIUM: Magnesium: 1.5 mg/dL — ABNORMAL LOW (ref 1.7–2.4)

## 2015-11-08 MED ORDER — CIPROFLOXACIN IN D5W 200 MG/100ML IV SOLN
200.0000 mg | Freq: Two times a day (BID) | INTRAVENOUS | Status: DC
  Administered 2015-11-08: 200 mg via INTRAVENOUS
  Filled 2015-11-08 (×3): qty 100

## 2015-11-08 MED ORDER — MAGNESIUM SULFATE 2 GM/50ML IV SOLN
2.0000 g | Freq: Once | INTRAVENOUS | Status: AC
  Administered 2015-11-08: 2 g via INTRAVENOUS
  Filled 2015-11-08: qty 50

## 2015-11-08 MED ORDER — CIPROFLOXACIN HCL 500 MG PO TABS: 500.0000 mg | ORAL_TABLET | Freq: Two times a day (BID) | ORAL | 0 refills | Status: AC

## 2015-11-08 NOTE — Progress Notes (Signed)
Pharmacy Antibiotic Note  [name] [name] is a 45 y.o. female admitted on 11/07/2015 with UTI.  Pharmacy has been consulted for cipro dosing.  *(Patient just d/ced 11/02/15 from [facility]- here with pyelonephritis then- Ucx: Klebsieelapneumo + on that visit. Received Ceftriaxone and discharged on Cefpodoxime)  Plan: Cipro 200mg  IV Q12H.  Recommended EKG to evaluate QT interval due to risk of QTc prolongation with ciprofloxacin/citalopram.  Height: 5\' 2"  (157.5 cm) Weight: 173 lb (78.5 kg) IBW/kg (Calculated) : 50.1  Temp (24hrs), Avg:98.6 F (37 C), Min:98.3 F (36.8 C), Max:98.9 F (37.2 C)   Recent Labs Lab 11/07/15 1013 11/08/15 0519 11/08/15 0523  WBC 18.1* 14.0*  --   CREATININE 1.20*  --  1.31*    Estimated Creatinine Clearance: 51.5 mL/min (by C-G formula based on SCr of 1.31 mg/dL (H)).    No Known Allergies  Antimicrobials this admission: Zosyn 10/5 >>  10/6 Cipro 10/6  >>    Dose adjustments this admission:    Microbiology results:  10/5  BCx:  Pending UA: trace LE, TNTC WBC, no bac.  10/5 UCx: pending    Sputum:      MRSA PCR:    Thank you for allowing pharmacy to be a part of this patient's care.  Eleuterio Dollar C 11/08/2015 3:30 PM

## 2015-11-08 NOTE — Discharge Summary (Signed)
[employer] [facility] [place] [facility]   PATIENT NAME: [name]    MR#:  [id number]  DATE OF BIRTH:  19-Jul-1970  DATE OF ADMISSION:  11/07/2015 ADMITTING PHYSICIAN: Gracelyn Nurse, MD  DATE OF DISCHARGE: 11/08/2015  PRIMARY CARE PHYSICIAN: Mila Merry, MD    ADMISSION DIAGNOSIS:  Acute cystitis without hematuria [N30.00] Nausea and vomiting, intractability of vomiting not specified, unspecified vomiting type [R11.2]  DISCHARGE DIAGNOSIS:  Active Problems:   Nausea & vomiting   SECONDARY DIAGNOSIS:   Past Medical History:  Diagnosis Date  . Anxiety   . Aortic arch aneurysm ([facility])   . CAD (coronary artery disease)    a. cath 08/2003 nonobs dzs;  b. 11/2014: mLAD 85-90% s/p PCI/DES (Xience 3.25 x 23); c. cath 04/2015: 70% mD1 @ bifurcation. Patent LAD stent - > Med Rx.; d. Cath-PCI 5/17: Patent LAD stent, D1 now ~80% - PCI Resolute DES 2.25 x 12.    . Chronic systolic CHF (congestive heart failure) ([facility])    a. echo 01/29/2013 EF 40-45%, basal anteroseptal & mid anteroseptal segments abnl; b echo 05/2012 EF 50-55%, mildly dilated LA, nl RVSP; c. 10/2015 Echo: EF 55-60%, mild conc LVH, no rwma.  . CKD (chronic kidney disease), stage IV ([facility])   . Colon polyp   . CVA (cerebral vascular accident) ([facility])    a. 06/2008-TEE nl LV fxn; b. 01/2013 - notes indicate short run of SVT at that time  . Depression   . Diabetes mellitus without complication ([facility])   . Gastric ulcer 04/27/2011  . GERD (gastroesophageal reflux disease)   . Headache    Migrains  . Hyperlipidemia   . Hypertensive heart disease   . Hypothyroidism   . Ischemic cardiomyopathy    a. EF prev 40-45% in 2014-->Improved to 55-60% (10/2015).  . Malignant melanoma of skin of scalp ([facility])   . Morbid obesity ([facility])    a. s/p gastric bypass.  . Multiple sclerosis ([facility]) 2015  . Orthostatic hypotension   . Syncope     HOSPITAL COURSE:    45 year old female with hx of neurogenic bladder who was treated  for klebsiella UTI here with nausea and vomiting.  1. Nausea and Vomiting this has subsided and she is tolerating her diet.  2. PyelooNephritis: I am suspecting that she needs a prolonged course of antibiotics. Klebsiella was sensitive to CIPRO during last hospital stay. She will be discharged with ciprofloxacin. She underwent CT of the abdomen during last hospital stay which showed known hydroureter. She was evaluated by urology. This is thought to be due to distended bladder and poor bladder and 2. She already has a follow-up with [facility] urology with a repeat renal and bladder ultrasound.  3. Neurogenic bladder: Patient did not want indwelling Foley during last hospital stay. She is instructed to perform timed voiding every 2 hours.  4.  Hypothyroidism: Continue Synthroid  5. Diabetes: Continue outpatient medications and diabetic diet  6. Hypertension: Continue diltiazem, isosorbide and losartan.     DISCHARGE CONDITIONS AND DIET:  Stable Cardiac diet  CONSULTS OBTAINED:    DRUG ALLERGIES:  No Known Allergies  DISCHARGE MEDICATIONS:   Current Discharge Medication List    START taking these medications   Details  ciprofloxacin (CIPRO) 500 MG tablet Take 1 tablet (500 mg total) by mouth 2 (two) times daily. Qty: 12 tablet, Refills: 0      CONTINUE these medications which have NOT CHANGED   Details  aspirin EC 81 MG  tablet Take 1 tablet (81 mg total) by mouth daily.    baclofen (LIORESAL) 10 MG tablet Take 1 tablet (10 mg total) by mouth 2 (two) times daily as needed for muscle spasms. Refills: 0    citalopram (CELEXA) 40 MG tablet Take 40 mg by mouth daily.    clopidogrel (PLAVIX) 75 MG tablet Take 75 mg by mouth daily.    diltiazem (CARDIZEM SR) 120 MG 12 hr capsule Take 120 mg by mouth daily.   Associated Diagnoses: Iron deficiency anemia    eszopiclone (LUNESTA) 2 MG TABS tablet Take 1 tablet (2 mg total) by mouth at bedtime as needed for sleep. Take  immediately before bedtime Qty: 30 tablet, Refills: 0   Associated Diagnoses: Insomnia    furosemide (LASIX) 20 MG tablet Take 20 mg by mouth daily.    gabapentin (NEURONTIN) 300 MG capsule Take 300 mg by mouth 3 (three) times daily.    isosorbide mononitrate (IMDUR) 60 MG 24 hr tablet Take 60 mg by mouth daily.   Associated Diagnoses: Iron deficiency anemia    levothyroxine (SYNTHROID, LEVOTHROID) 25 MCG tablet Take 1 tablet (25 mcg total) by mouth daily before breakfast. Qty: 30 tablet, Refills: 5    losartan (COZAAR) 25 MG tablet Take 25 mg by mouth daily.    metFORMIN (GLUCOPHAGE) 1000 MG tablet Take 1,000 mg by mouth 2 (two) times daily with a meal.     Multiple Vitamin (MULTIVITAMIN WITH MINERALS) TABS tablet Take 1 tablet by mouth daily.    nitroGLYCERIN (NITROSTAT) 0.4 MG SL tablet Place 1 tablet (0.4 mg total) under the tongue every 5 (five) minutes as needed for chest pain. Qty: 25 tablet, Refills: 6    oxyCODONE (OXY IR/ROXICODONE) 5 MG immediate release tablet Take 1 tablet (5 mg total) by mouth every 6 (six) hours as needed for moderate pain. Qty: 30 tablet, Refills: 0    pantoprazole (PROTONIX) 40 MG tablet Take 1 tablet (40 mg total) by mouth 2 (two) times daily. Qty: 60 tablet, Refills: 5    promethazine (PHENERGAN) 25 MG tablet Take 25 mg by mouth every 6 (six) hours as needed for nausea or vomiting. Reported on 05/31/2015    rosuvastatin (CRESTOR) 40 MG tablet Take 1 tablet (40 mg total) by mouth daily at 6 PM. Qty: 90 tablet, Refills: 3    traZODone (DESYREL) 100 MG tablet Take 200 mg by mouth at bedtime.      STOP taking these medications     cefpodoxime (VANTIN) 200 MG tablet               Today   CHIEF COMPLAINT:  Patient has improved symptoms. She is complaining of some discomfort however much improved. She is tolerating a diet.   VITAL SIGNS:  Blood pressure 119/66, pulse (!) 119, temperature 98.9 F (37.2 C), temperature source Oral,  resp. rate 16, height 5\' 2"  (1.575 m), weight 78.5 kg (173 lb), SpO2 97 %.   REVIEW OF SYSTEMS:  Review of Systems  Constitutional: Negative.  Negative for chills, fever and malaise/fatigue.  HENT: Negative.  Negative for ear discharge, ear pain, hearing loss, nosebleeds and sore throat.   Eyes: Negative.  Negative for blurred vision and pain.  Respiratory: Negative.  Negative for cough, hemoptysis, shortness of breath and wheezing.   Cardiovascular: Negative.  Negative for chest pain, palpitations and leg swelling.  Gastrointestinal: Positive for abdominal pain (improved). Negative for blood in stool, diarrhea, nausea and vomiting.  Genitourinary: Negative.  Negative for  dysuria.  Musculoskeletal: Negative.  Negative for back pain.  Skin: Negative.   Neurological: Negative for dizziness, tremors, speech change, focal weakness, seizures and headaches.  Endo/Heme/Allergies: Negative.  Does not bruise/bleed easily.  Psychiatric/Behavioral: Negative.  Negative for depression, hallucinations and suicidal ideas.     PHYSICAL EXAMINATION:  GENERAL:  45 y.o.-year-old patient lying in the bed with no acute distress.  NECK:  Supple, no jugular venous distention. No thyroid enlargement, no tenderness.  LUNGS: Normal breath sounds bilaterally, no wheezing, rales,rhonchi  No use of accessory muscles of respiration.  CARDIOVASCULAR: S1, S2 normal. No murmurs, rubs, or gallops.  ABDOMEN: Soft, non-tender, non-distended. Bowel sounds present. No organomegaly or mass.  EXTREMITIES: No pedal edema, cyanosis, or clubbing.  PSYCHIATRIC: The patient is alert and oriented x 3.  SKIN: No obvious rash, lesion, or ulcer.   DATA REVIEW:   CBC  Recent Labs Lab 11/08/15 0519  WBC 14.0*  HGB 12.7  HCT 38.5  PLT 231    Chemistries   Recent Labs Lab 11/07/15 1013 11/08/15 0523  NA 138 139  K 3.0* 3.7  CL 98* 106  CO2 30 22  GLUCOSE 180* 107*  BUN 10 8  CREATININE 1.20* 1.31*  CALCIUM 9.1  8.3*  MG  --  1.5*  AST 23  --   ALT 22  --   ALKPHOS 144*  --   BILITOT 0.4  --     Cardiac Enzymes No results for input(s): TROPONINI in the last 168 hours.  Microbiology Results  @[email or web address] @  RADIOLOGY:  No results found.    Management plans discussed with the patient and she is in agreement. Stable for discharge home  Patient should follow up with pcp  CODE STATUS:     Code Status Orders        Start     Ordered   11/07/15 1522  Full code  Continuous     11/07/15 1521    Code Status History    Date Active Date Inactive Code Status Order ID Comments User Context   10/28/2015  4:04 PM 11/02/2015  3:04 PM Full Code [id number]  Houston Siren, MD Inpatient   06/03/2015  3:10 PM 06/05/2015  3:01 PM Full Code [id number]  Enedina Finner, MD Inpatient   04/17/2015  9:59 PM 04/18/2015  8:45 PM Full Code [id number]  Altamese Dilling, MD ED   02/15/2015  8:04 PM 02/20/2015  7:03 PM Full Code [id number]  Audery Amel, MD Inpatient   11/12/2014  8:33 AM 11/13/2014  2:49 PM Full Code [id number]  Marcina Millard, MD Inpatient   11/07/2014  9:53 PM 11/12/2014  8:33 AM Full Code [id number]  Houston Siren, MD Inpatient      TOTAL TIME TAKING CARE OF THIS PATIENT: 35 minutes.    Note: This dictation was prepared with Dragon dictation along with smaller phrase technology. Any transcriptional errors that result from this process are unintentional.  Jossie Smoot M.D on 11/08/2015 at 12:49 PM  Between 7am to 6pm - Pager - [phone] After 6pm go to [email or web address] - Social research officer, government  [facility]  Office  [phone]  CC: Primary care physician; Mila Merry, MD

## 2015-11-08 NOTE — Progress Notes (Signed)
Patient does not want to go home. She ate her lunch and has no nausea/vomiting or abdmonimal pain. WBC down no fever.  She is medically stable for discharge.  I discussed findings from last hospital stay and importance of follow up with UROLOGY. She has pyelo and it will take up to 10-14 days to improved from last admit.  Patient can be discharged today

## 2015-11-08 NOTE — Telephone Encounter (Signed)
Pt returned call and pt already has appt on 11/15/15 @ 8 am so I changed that appt to hospital F/U. Thanks TNP

## 2015-11-08 NOTE — Telephone Encounter (Signed)
OK to put her in one of the same day slots on Thursday or Friday.

## 2015-11-08 NOTE — Telephone Encounter (Signed)
Please advise 

## 2015-11-08 NOTE — Telephone Encounter (Signed)
Called pt no answer. LMTCB. Thanks TNP

## 2015-11-08 NOTE — Progress Notes (Signed)
Pt d/c to home today.  IV removed intact.  Rx's given to pt w/all questions and concerns addressed.  D/C paperwork reviewed and education provided with all questions and concerns addressed.  Pt husband at bedside for home transport.  

## 2015-11-08 NOTE — Telephone Encounter (Signed)
[place] called to schedule hospital f/u for one week out. Pt is being discharged today and was treated for nausea and vomiting. Please advise when pt can be worked in. Thanks TNP

## 2015-11-12 LAB — CULTURE, BLOOD (ROUTINE X 2)
CULTURE: NO GROWTH
Culture: NO GROWTH

## 2015-11-15 ENCOUNTER — Encounter: Payer: Self-pay | Admitting: Family Medicine

## 2015-11-15 ENCOUNTER — Ambulatory Visit (INDEPENDENT_AMBULATORY_CARE_PROVIDER_SITE_OTHER): Payer: BC Managed Care – PPO | Admitting: Family Medicine

## 2015-11-15 VITALS — BP 124/74 | HR 92 | Temp 98.8°F | Resp 16 | Ht 62.0 in | Wt 173.0 lb

## 2015-11-15 DIAGNOSIS — R112 Nausea with vomiting, unspecified: Secondary | ICD-10-CM

## 2015-11-15 DIAGNOSIS — N390 Urinary tract infection, site not specified: Secondary | ICD-10-CM | POA: Diagnosis not present

## 2015-11-15 LAB — POCT URINALYSIS DIPSTICK
Bilirubin, UA: NEGATIVE
Glucose, UA: NEGATIVE
Nitrite, UA: NEGATIVE
PH UA: 6
SPEC GRAV UA: 1.025
Urobilinogen, UA: 0.2

## 2015-11-15 MED ORDER — PROMETHAZINE HCL 25 MG PO TABS: 25.0000 mg | ORAL_TABLET | Freq: Four times a day (QID) | ORAL | 3 refills | Status: DC | PRN

## 2015-11-15 MED ORDER — CITALOPRAM HYDROBROMIDE 40 MG PO TABS: 40.0000 mg | ORAL_TABLET | Freq: Every day | ORAL | 5 refills | Status: DC

## 2015-11-15 MED ORDER — FLUCONAZOLE 150 MG PO TABS: 150.0000 mg | ORAL_TABLET | Freq: Once | ORAL | 2 refills | Status: AC

## 2015-11-15 MED ORDER — NITROFURANTOIN MONOHYD MACRO 100 MG PO CAPS: 100.0000 mg | ORAL_CAPSULE | Freq: Two times a day (BID) | ORAL | 0 refills | Status: DC

## 2015-11-15 NOTE — Progress Notes (Signed)
Patient: [name] Female    DOB: Feb 27, 1970   45 y.o.   MRN: [id number] Visit Date: 11/15/2015  Today's Provider: [name], MD   Chief Complaint  Patient presents with  . Hospitalization Follow-up   Subjective:    Patient still has nausea, vomiting, diarrhea and stomach pain. Patient states that she has no appetite. Patient also has burning and pain when urinating.       Follow up Hospitalization  Patient was admitted to [facility] on 11/07/2015 and discharged on 11/08/2015. She was treated for nausea and vomiting, and pyelonephritis. Treatment for this included; patient was discharged with ciprofloxacin. Patient was advised to follow-up with [facility] with a repeat renal and bladder ultrasound. She reports good compliance with treatment. She reports this condition is Unchanged. She completed course of cipro yesterday.   Urine cultures grew multiple species which were not identified.  ----------------------------------------------------------------    No Known Allergies   Current Outpatient Prescriptions:  .  aspirin EC 81 MG tablet, Take 1 tablet (81 mg total) by mouth daily., Disp: , Rfl:  .  baclofen (LIORESAL) 10 MG tablet, Take 1 tablet (10 mg total) by mouth 2 (two) times daily as needed for muscle spasms., Disp: , Rfl: 0 .  citalopram (CELEXA) 40 MG tablet, Take 40 mg by mouth daily., Disp: , Rfl:  .  clopidogrel (PLAVIX) 75 MG tablet, Take 75 mg by mouth daily., Disp: , Rfl:  .  diltiazem (CARDIZEM SR) 120 MG 12 hr capsule, Take 120 mg by mouth daily., Disp: , Rfl:  .  eszopiclone (LUNESTA) 2 MG TABS tablet, Take 1 tablet (2 mg total) by mouth at bedtime as needed for sleep. Take immediately before bedtime, Disp: 30 tablet, Rfl: 0 .  furosemide (LASIX) 20 MG tablet, Take 20 mg by mouth daily., Disp: , Rfl:  .  gabapentin (NEURONTIN) 300 MG capsule, Take 300 mg by mouth 3 (three) times daily., Disp: , Rfl:  .  isosorbide mononitrate (IMDUR) 60  MG 24 hr tablet, Take 60 mg by mouth daily., Disp: , Rfl:  .  levothyroxine (SYNTHROID, LEVOTHROID) 25 MCG tablet, Take 1 tablet (25 mcg total) by mouth daily before breakfast., Disp: 30 tablet, Rfl: 5 .  losartan (COZAAR) 25 MG tablet, Take 25 mg by mouth daily., Disp: , Rfl:  .  metFORMIN (GLUCOPHAGE) 1000 MG tablet, Take 1,000 mg by mouth 2 (two) times daily with a meal. , Disp: , Rfl:  .  Multiple Vitamin (MULTIVITAMIN WITH MINERALS) TABS tablet, Take 1 tablet by mouth daily., Disp: , Rfl:  .  nitroGLYCERIN (NITROSTAT) 0.4 MG SL tablet, Place 1 tablet (0.4 mg total) under the tongue every 5 (five) minutes as needed for chest pain., Disp: 25 tablet, Rfl: 6 .  oxyCODONE (OXY IR/ROXICODONE) 5 MG immediate release tablet, Take 1 tablet (5 mg total) by mouth every 6 (six) hours as needed for moderate pain., Disp: 30 tablet, Rfl: 0 .  pantoprazole (PROTONIX) 40 MG tablet, Take 1 tablet (40 mg total) by mouth 2 (two) times daily., Disp: 60 tablet, Rfl: 5 .  promethazine (PHENERGAN) 25 MG tablet, Take 25 mg by mouth every 6 (six) hours as needed for nausea or vomiting. Reported on 05/31/2015, Disp: , Rfl:  .  rosuvastatin (CRESTOR) 40 MG tablet, Take 1 tablet (40 mg total) by mouth daily at 6 PM., Disp: 90 tablet, Rfl: 3 .  traZODone (DESYREL) 100 MG tablet, Take 200 mg by mouth at  bedtime., Disp: , Rfl:   Review of Systems  Constitutional: Positive for appetite change. Negative for chills, fatigue and fever.  Respiratory: Negative for chest tightness and shortness of breath.   Cardiovascular: Negative for chest pain and palpitations.  Gastrointestinal: Positive for abdominal pain, diarrhea, nausea and vomiting.  Neurological: Negative for dizziness and weakness.    Social History  Substance Use Topics  . Smoking status: Former Smoker    Types: Cigarettes    Quit date: 08/31/1994  . Smokeless tobacco: Never Used     Comment: quit 28 years ago  . Alcohol use No   Objective:   BP 124/74 (BP  Location: Right Arm, Patient Position: Sitting, Cuff Size: Normal)   Pulse 92   Temp 98.8 F (37.1 C) (Oral)   Resp 16   Ht 5\' 2"  (1.575 m)   Wt 173 lb (78.5 kg)   LMP  (Approximate) Comment: 14 years ago  SpO2 98%   BMI 31.64 kg/m   Physical Exam  General Appearance:    Alert, cooperative, no distress  Eyes:    PERRL, conjunctiva/corneas clear, EOM's intact       Lungs:     Clear to auscultation bilaterally, respirations unlabored  Heart:    Regular rate and rhythm  Abdomen:   bowel sounds present and normal in all 4 quadrants, soft or round. Mild diffuse abdominal tenderness. CVA tenderness is present bilaterally   Results for orders placed or performed in visit on 11/15/15  POCT urinalysis dipstick  Result Value Ref Range   Color, UA Dark Yellow    Clarity, UA Cloudy    Glucose, UA Neg    Bilirubin, UA Neg    Ketones, UA Trace    Spec Grav, UA 1.025    Blood, UA Small    pH, UA 6.0    Protein, UA Trace    Urobilinogen, UA 0.2    Nitrite, UA Neg    Leukocytes, UA moderate (2+) (A) Negative        Assessment & Plan:     1. Urinary tract infection without hematuria, site unspecified Persistent symptoms and positive u/a after extensive IV antibiotic and finished with oral Cipro. Send urine for culture. Has follow up urology on 10-20. Cover with nitrofurantoin while awaiting culture - fluconazole (DIFLUCAN) 150 MG tablet; Take 1 tablet (150 mg total) by mouth once.  Dispense: 1 tablet; Refill: 2 - POCT urinalysis dipstick - nitrofurantoin, macrocrystal-monohydrate, (MACROBID) 100 MG capsule; Take 1 capsule (100 mg total) by mouth 2 (two) times daily.  Dispense: 14 capsule; Refill: 0 - Urine culture  2. Nausea and vomiting, intractability of vomiting not specified, unspecified vomiting type  - promethazine (PHENERGAN) 25 MG tablet; Take 1 tablet (25 mg total) by mouth every 6 (six) hours as needed for nausea or vomiting. Reported on 05/31/2015  Dispense: 30 tablet;  Refill: 3 - CBC - Comprehensive metabolic panel - Urine culture       [name], MD  [facility].

## 2015-11-16 LAB — COMPREHENSIVE METABOLIC PANEL
ALT: 24 IU/L (ref 0–32)
AST: 24 IU/L (ref 0–40)
Albumin/Globulin Ratio: 1.4 (ref 1.2–2.2)
Albumin: 4.1 g/dL (ref 3.5–5.5)
Alkaline Phosphatase: 119 IU/L — ABNORMAL HIGH (ref 39–117)
BUN/Creatinine Ratio: 8 — ABNORMAL LOW (ref 9–23)
BUN: 13 mg/dL (ref 6–24)
Bilirubin Total: 0.3 mg/dL (ref 0.0–1.2)
CALCIUM: 8.9 mg/dL (ref 8.7–10.2)
CO2: 27 mmol/L (ref 18–29)
CREATININE: 1.71 mg/dL — AB (ref 0.57–1.00)
Chloride: 98 mmol/L (ref 96–106)
GFR calc Af Amer: 41 mL/min/{1.73_m2} — ABNORMAL LOW (ref >59)
GFR, EST NON AFRICAN AMERICAN: 35 mL/min/{1.73_m2} — AB (ref >59)
GLOBULIN, TOTAL: 3 g/dL (ref 1.5–4.5)
Glucose: 76 mg/dL (ref 65–99)
Potassium: 4.2 mmol/L (ref 3.5–5.2)
Sodium: 142 mmol/L (ref 134–144)
Total Protein: 7.1 g/dL (ref 6.0–8.5)

## 2015-11-16 LAB — CBC
HEMATOCRIT: 38.2 % (ref 34.0–46.6)
Hemoglobin: 12.7 g/dL (ref 11.1–15.9)
MCH: 26.6 pg (ref 26.6–33.0)
MCHC: 33.2 g/dL (ref 31.5–35.7)
MCV: 80 fL (ref 79–97)
PLATELETS: 276 10*3/uL (ref 150–379)
RBC: 4.77 x10E6/uL (ref 3.77–5.28)
RDW: 15.4 % (ref 12.3–15.4)
WBC: 9.6 10*3/uL (ref 3.4–10.8)

## 2015-11-17 LAB — URINE CULTURE

## 2015-11-17 LAB — PLEASE NOTE

## 2015-11-18 ENCOUNTER — Telehealth: Payer: Self-pay | Admitting: *Deleted

## 2015-11-18 NOTE — Telephone Encounter (Signed)
Patient is requesting a medication for arthritis in her hands. Pt stated that she forgot to mention this at her last office visit. Patient stated that the pain is really bad in her fingers and she has hard time bending them. Please advise?

## 2015-11-19 NOTE — Telephone Encounter (Signed)
Can call in tramadol 50mg  one every eight hours as needed for pain, #30, rf x 3

## 2015-11-20 MED ORDER — TRAMADOL HCL 50 MG PO TABS: 50.0000 mg | ORAL_TABLET | Freq: Three times a day (TID) | ORAL | 3 refills | Status: DC | PRN

## 2015-11-20 NOTE — Telephone Encounter (Signed)
Patient was notified. Patient stated that she will try the Tramadol. Rx called into pharmacy.

## 2015-11-22 ENCOUNTER — Ambulatory Visit (INDEPENDENT_AMBULATORY_CARE_PROVIDER_SITE_OTHER): Payer: BC Managed Care – PPO | Admitting: Urology

## 2015-11-22 VITALS — BP 142/88 | HR 87 | Ht 63.0 in | Wt 177.0 lb

## 2015-11-22 DIAGNOSIS — N183 Chronic kidney disease, stage 3 unspecified: Secondary | ICD-10-CM

## 2015-11-22 DIAGNOSIS — R339 Retention of urine, unspecified: Secondary | ICD-10-CM

## 2015-11-22 DIAGNOSIS — N1339 Other hydronephrosis: Secondary | ICD-10-CM

## 2015-11-22 LAB — BLADDER SCAN AMB NON-IMAGING: Scan Result: 0

## 2015-11-22 NOTE — Progress Notes (Signed)
11/22/2015 9:04 AM   [place] Feb 06, 1970 [id number]  Referring provider: [name], MD [address] [place],  [address]  Chief Complaint  Patient presents with  . Follow-up    difficulty emptying, hdronephrosis     HPI: The patient is a 45 year old female who suffered follow-up of a mild left hydroureteronephrosis seen in the hospital. She was CT scan performed which showed no stones but very mild left hydroureteronephrosis. Of note at that time, her bladder was markedly distended.  Two days prior to this CT, she had a negative renal ultrasound. She had a Klebsiella urinary tract infection at that time. She was also stating she is of difficult voiding at the time of her CT scan. She feels that she is voiding now at baseline and feels like her bladder is empty.  The patient states that she has urinary tract infections frequently however in review of her chart have seen multiple urine cultures and only one of these has been positive.  Of note, her PVR today was 0 cc.  She does have CKD stage III.  PMH: Past Medical History:  Diagnosis Date  . Anxiety   . Aortic arch aneurysm ([place])   . CAD (coronary artery disease)    a. cath 08/2003 nonobs dzs;  b. 11/2014: mLAD 85-90% s/p PCI/DES (Xience 3.25 x 23); c. cath 04/2015: 70% mD1 @ bifurcation. Patent LAD stent - > Med Rx.; d. Cath-PCI 5/17: Patent LAD stent, D1 now ~80% - PCI Resolute DES 2.25 x 12.    . Chronic systolic CHF (congestive heart failure) ([place])    a. echo 01/29/2013 EF 40-45%, basal anteroseptal & mid anteroseptal segments abnl; b echo 05/2012 EF 50-55%, mildly dilated LA, nl RVSP; c. 10/2015 Echo: EF 55-60%, mild conc LVH, no rwma.  . CKD (chronic kidney disease), stage IV ([place])   . Colon polyp   . CVA (cerebral vascular accident) ([place])    a. 06/2008-TEE nl LV fxn; b. 01/2013 - notes indicate short run of SVT at that time  . Depression   . Diabetes mellitus without complication ([place])   .  Gastric ulcer 04/27/2011  . GERD (gastroesophageal reflux disease)   . Headache    Migrains  . Hyperlipidemia   . Hypertensive heart disease   . Hypothyroidism   . Ischemic cardiomyopathy    a. EF prev 40-45% in 2014-->Improved to 55-60% (10/2015).  . Malignant melanoma of skin of scalp ([place])   . Morbid obesity ([place])    a. s/p gastric bypass.  . Multiple sclerosis ([place]) 2015  . Orthostatic hypotension   . Syncope     Surgical History: Past Surgical History:  Procedure Laterality Date  . APPENDECTOMY    . CARDIAC CATHETERIZATION N/A 11/09/2014   Procedure: Coronary Angiography;  Surgeon: Minna Merritts, MD;  Location: [place] [facility];  Service: Cardiovascular;  Laterality: N/A;  . CARDIAC CATHETERIZATION N/A 11/12/2014   Procedure: Coronary Stent Intervention;  Surgeon: Isaias Cowman, MD;  Location: [place] [facility];  Service: Cardiovascular;  Laterality: N/A;  . CARDIAC CATHETERIZATION N/A 04/18/2015   Procedure: Left Heart Cath and Coronary Angiography;  Surgeon: Minna Merritts, MD;  Location: [place] [facility];  Service: Cardiovascular;  Laterality: N/A;  . CARDIAC CATHETERIZATION Left 06/04/2015   Procedure: Left Heart Cath and Coronary Angiography;  Surgeon: Wellington Hampshire, MD;  Location: [place] [facility];  Service: Cardiovascular;  Laterality: Left;  . CARDIAC CATHETERIZATION N/A 06/04/2015  Procedure: Coronary Stent Intervention;  Surgeon: Wellington Hampshire, MD;  Location: [place] [facility];  Service: Cardiovascular;  Laterality: N/A;  . DILATION AND CURETTAGE OF UTERUS    . ESOPHAGOGASTRODUODENOSCOPY (EGD) WITH PROPOFOL N/A 09/14/2014   Procedure: ESOPHAGOGASTRODUODENOSCOPY (EGD) WITH PROPOFOL;  Surgeon: Josefine Class, MD;  Location: [facility];  Service: Endoscopy;  Laterality: N/A;  . GASTRIC BYPASS  09/2009   [facility]   . Left Carotid to sublcavian artery bypass w/ subclavian artery ligation     a. Performed @ [facility].    . MELANOMA EXCISION  2016   Dr. Evorn Gong  . RIGHT OOPHORECTOMY    . TRIGGER FINGER RELEASE    . TUBAL LIGATION     R tube removed still w/ Left    Home Medications:    Medication List       Accurate as of 11/22/15  9:04 AM. Always use your most recent med list.          aspirin EC 81 MG tablet Take 1 tablet (81 mg total) by mouth daily.   baclofen 10 MG tablet Commonly known as:  LIORESAL Take 1 tablet (10 mg total) by mouth 2 (two) times daily as needed for muscle spasms.   citalopram 40 MG tablet Commonly known as:  CELEXA Take 1 tablet (40 mg total) by mouth daily.   clopidogrel 75 MG tablet Commonly known as:  PLAVIX Take 75 mg by mouth daily.   diltiazem 120 MG 12 hr capsule Commonly known as:  CARDIZEM SR Take 120 mg by mouth daily.   eszopiclone 2 MG Tabs tablet Commonly known as:  LUNESTA Take 1 tablet (2 mg total) by mouth at bedtime as needed for sleep. Take immediately before bedtime   furosemide 20 MG tablet Commonly known as:  LASIX Take 20 mg by mouth daily.   gabapentin 300 MG capsule Commonly known as:  NEURONTIN Take 300 mg by mouth 3 (three) times daily.   isosorbide mononitrate 60 MG 24 hr tablet Commonly known as:  IMDUR Take 60 mg by mouth daily.   levothyroxine 25 MCG tablet Commonly known as:  SYNTHROID, LEVOTHROID Take 1 tablet (25 mcg total) by mouth daily before breakfast.   losartan 25 MG tablet Commonly known as:  COZAAR Take 25 mg by mouth daily.   metFORMIN 1000 MG tablet Commonly known as:  GLUCOPHAGE Take 1,000 mg by mouth 2 (two) times daily with a meal.   nitroGLYCERIN 0.4 MG SL tablet Commonly known as:  NITROSTAT Place 1 tablet (0.4 mg total) under the tongue every 5 (five) minutes as needed for chest pain.   pantoprazole 40 MG tablet Commonly known as:  PROTONIX Take 1 tablet (40 mg total) by mouth 2 (two) times daily.   promethazine 25 MG tablet Commonly known as:  PHENERGAN Take 1 tablet (25 mg total) by  mouth every 6 (six) hours as needed for nausea or vomiting. Reported on 05/31/2015   rosuvastatin 40 MG tablet Commonly known as:  CRESTOR Take 1 tablet (40 mg total) by mouth daily at 6 PM.   traMADol 50 MG tablet Commonly known as:  ULTRAM Take 1 tablet (50 mg total) by mouth every 8 (eight) hours as needed.   traZODone 100 MG tablet Commonly known as:  DESYREL Take 200 mg by mouth at bedtime.       Allergies: No Known Allergies  Family History: Family History  Problem Relation Age of Onset  . Hypertension Mother   .  Anxiety disorder Mother   . Depression Mother   . Bipolar disorder Mother   . Heart disease Mother   . Hyperlipidemia Mother   . Kidney disease Father   . Heart disease Father   . Hypertension Father   . Diabetes Father   . Stroke Father   . Colon cancer Father     dx in his 49's  . Anxiety disorder Father   . Depression Father   . Skin cancer Father   . Kidney disease Sister   . Thyroid nodules Sister   . Hypertension Sister   . Hypertension Sister   . Diabetes Sister   . Hyperlipidemia Sister   . Depression Sister     Social History:  reports that she quit smoking about 21 years ago. Her smoking use included Cigarettes. She has never used smokeless tobacco. She reports that she does not drink alcohol or use drugs.  ROS: UROLOGY Frequent Urination?: Yes Hard to postpone urination?: No Burning/pain with urination?: No Get up at night to urinate?: Yes Leakage of urine?: No Urine stream starts and stops?: No Trouble starting stream?: No Do you have to strain to urinate?: No Blood in urine?: No Urinary tract infection?: Yes Sexually transmitted disease?: No Injury to kidneys or bladder?: No Painful intercourse?: No Weak stream?: No Currently pregnant?: No Vaginal bleeding?: No Last menstrual period?: n  Gastrointestinal Nausea?: Yes Vomiting?: No Indigestion/heartburn?: No Diarrhea?: Yes Constipation?: No  Constitutional Fever:  No Night sweats?: No Weight loss?: Yes Fatigue?: No  Skin Skin rash/lesions?: No Itching?: No  Eyes Blurred vision?: No Double vision?: No  Ears/Nose/Throat Sore throat?: No Sinus problems?: No  Hematologic/Lymphatic Swollen glands?: No Easy bruising?: No  Cardiovascular Leg swelling?: No Chest pain?: No  Respiratory Cough?: No Shortness of breath?: No  Endocrine Excessive thirst?: No  Musculoskeletal Back pain?: Yes Joint pain?: No  Neurological Headaches?: No Dizziness?: No  Psychologic Depression?: Yes Anxiety?: Yes  Physical Exam: BP (!) 142/88   Pulse 87   Ht 5\' 3"  (1.6 m)   Wt 177 lb (80.3 kg)   LMP  (Approximate) Comment: 14 years ago  BMI 31.35 kg/m   Constitutional:  Alert and oriented, No acute distress. HEENT: [place] AT, moist mucus membranes.  Trachea midline, no masses. Cardiovascular: No clubbing, cyanosis, or edema. Respiratory: Normal respiratory effort, no increased work of breathing. GI: Abdomen is soft, nontender, nondistended, no abdominal masses GU: No CVA tenderness.  Skin: No rashes, bruises or suspicious lesions. Lymph: No cervical or inguinal adenopathy. Neurologic: Grossly intact, no focal deficits, moving all 4 extremities. Psychiatric: Normal mood and affect.  Laboratory Data: Lab Results  Component Value Date   WBC 9.6 11/15/2015   HGB 12.7 11/08/2015   HCT 38.2 11/15/2015   MCV 80 11/15/2015   PLT 276 11/15/2015    Lab Results  Component Value Date   CREATININE 1.71 (H) 11/15/2015    No results found for: PSA  No results found for: TESTOSTERONE  Lab Results  Component Value Date   HGBA1C 7.3 08/14/2015    Urinalysis    Component Value Date/Time   COLORURINE YELLOW (A) 11/07/2015 1013   APPEARANCEUR HAZY (A) 11/07/2015 1013   APPEARANCEUR Cloudy (A) 07/17/2014 1455   LABSPEC 1.015 11/07/2015 1013   LABSPEC 1.012 12/29/2013 1210   PHURINE 6.0 11/07/2015 1013   GLUCOSEU >500 (A) 11/07/2015 1013    GLUCOSEU >=500 12/29/2013 1210   HGBUR 2+ (A) 11/07/2015 1013   HGBUR negative 04/14/2010 1435  BILIRUBINUR Neg 11/15/2015 0838   BILIRUBINUR Negative 07/17/2014 1455   BILIRUBINUR Negative 12/29/2013 1210   KETONESUR NEGATIVE 11/07/2015 1013   PROTEINUR Trace 11/15/2015 0838   PROTEINUR 100 (A) 11/07/2015 1013   UROBILINOGEN 0.2 11/15/2015 0838   UROBILINOGEN negative 04/14/2010 1435   NITRITE Neg 11/15/2015 0838   NITRITE NEGATIVE 11/07/2015 1013   LEUKOCYTESUR moderate (2+) (A) 11/15/2015 0838   LEUKOCYTESUR Trace (A) 07/17/2014 1455   LEUKOCYTESUR Negative 12/29/2013 1210     Assessment & Plan:    1. Left hydroureteronephrosis 2. Acute urinary retention 3. CKD stage III The patient's mild left hydroureteronephrosis at the time of her urinary tract infection is likely related to acute urinary retention. Her urinary retention has since resolved as her PVR is 0 today. I suspect her hydroureteronephrosis also will have resolved at this time. We will check a renal ultrasound to ensure this. We'll call the patient with the results.  Nickie Retort, MD  [facility] [address] [place], [place] [address] [phone]

## 2015-11-25 ENCOUNTER — Other Ambulatory Visit: Payer: Self-pay | Admitting: Family Medicine

## 2015-11-25 DIAGNOSIS — Z1231 Encounter for screening mammogram for malignant neoplasm of breast: Secondary | ICD-10-CM

## 2015-11-28 ENCOUNTER — Ambulatory Visit
Admission: RE | Admit: 2015-11-28 | Discharge: 2015-11-28 | Disposition: A | Discharge status: Home / Self Care | Payer: BC Managed Care – PPO | Source: Ambulatory Visit | Attending: Urology | Admitting: Urology

## 2015-11-28 DIAGNOSIS — N1339 Other hydronephrosis: Secondary | ICD-10-CM | POA: Diagnosis not present

## 2015-12-02 ENCOUNTER — Other Ambulatory Visit: Payer: Self-pay

## 2015-12-02 MED ORDER — TRAZODONE HCL 100 MG PO TABS: 200.0000 mg | ORAL_TABLET | Freq: Every day | ORAL | 6 refills | Status: DC

## 2015-12-02 NOTE — Telephone Encounter (Signed)
Please advise 

## 2015-12-02 NOTE — Telephone Encounter (Signed)
Patient requesting refill on trazodone 100 mg sent to [facility] in [place].

## 2015-12-06 ENCOUNTER — Telehealth: Payer: Self-pay | Admitting: Urology

## 2015-12-06 NOTE — Telephone Encounter (Signed)
Patient called said she is still waiting on her RUS results Can you please call her with them  Sharyn Lull

## 2015-12-09 ENCOUNTER — Telehealth: Payer: Self-pay

## 2015-12-09 NOTE — Telephone Encounter (Signed)
Pt walked in today requesting RUS results. Please advise.

## 2015-12-10 ENCOUNTER — Encounter: Payer: Self-pay | Admitting: Cardiovascular Disease

## 2015-12-11 ENCOUNTER — Inpatient Hospital Stay: Payer: BC Managed Care – PPO | Attending: Oncology

## 2015-12-11 DIAGNOSIS — Z87891 Personal history of nicotine dependence: Secondary | ICD-10-CM | POA: Insufficient documentation

## 2015-12-11 DIAGNOSIS — Z8669 Personal history of other diseases of the nervous system and sense organs: Secondary | ICD-10-CM | POA: Insufficient documentation

## 2015-12-11 DIAGNOSIS — Z803 Family history of malignant neoplasm of breast: Secondary | ICD-10-CM | POA: Insufficient documentation

## 2015-12-11 DIAGNOSIS — E785 Hyperlipidemia, unspecified: Secondary | ICD-10-CM | POA: Insufficient documentation

## 2015-12-11 DIAGNOSIS — Z85820 Personal history of malignant melanoma of skin: Secondary | ICD-10-CM | POA: Diagnosis not present

## 2015-12-11 DIAGNOSIS — Z800 Family history of malignant neoplasm of digestive organs: Secondary | ICD-10-CM | POA: Insufficient documentation

## 2015-12-11 DIAGNOSIS — R5383 Other fatigue: Secondary | ICD-10-CM | POA: Diagnosis not present

## 2015-12-11 DIAGNOSIS — F419 Anxiety disorder, unspecified: Secondary | ICD-10-CM | POA: Diagnosis not present

## 2015-12-11 DIAGNOSIS — E039 Hypothyroidism, unspecified: Secondary | ICD-10-CM | POA: Insufficient documentation

## 2015-12-11 DIAGNOSIS — K219 Gastro-esophageal reflux disease without esophagitis: Secondary | ICD-10-CM | POA: Insufficient documentation

## 2015-12-11 DIAGNOSIS — R531 Weakness: Secondary | ICD-10-CM | POA: Diagnosis not present

## 2015-12-11 DIAGNOSIS — I951 Orthostatic hypotension: Secondary | ICD-10-CM | POA: Diagnosis not present

## 2015-12-11 DIAGNOSIS — N1330 Unspecified hydronephrosis: Secondary | ICD-10-CM | POA: Insufficient documentation

## 2015-12-11 DIAGNOSIS — Z86010 Personal history of colonic polyps: Secondary | ICD-10-CM | POA: Insufficient documentation

## 2015-12-11 DIAGNOSIS — I130 Hypertensive heart and chronic kidney disease with heart failure and stage 1 through stage 4 chronic kidney disease, or unspecified chronic kidney disease: Secondary | ICD-10-CM | POA: Diagnosis not present

## 2015-12-11 DIAGNOSIS — N390 Urinary tract infection, site not specified: Secondary | ICD-10-CM | POA: Insufficient documentation

## 2015-12-11 DIAGNOSIS — Z7982 Long term (current) use of aspirin: Secondary | ICD-10-CM | POA: Insufficient documentation

## 2015-12-11 DIAGNOSIS — I5022 Chronic systolic (congestive) heart failure: Secondary | ICD-10-CM | POA: Insufficient documentation

## 2015-12-11 DIAGNOSIS — N184 Chronic kidney disease, stage 4 (severe): Secondary | ICD-10-CM | POA: Insufficient documentation

## 2015-12-11 DIAGNOSIS — D509 Iron deficiency anemia, unspecified: Secondary | ICD-10-CM | POA: Diagnosis not present

## 2015-12-11 DIAGNOSIS — I255 Ischemic cardiomyopathy: Secondary | ICD-10-CM | POA: Insufficient documentation

## 2015-12-11 DIAGNOSIS — Z8719 Personal history of other diseases of the digestive system: Secondary | ICD-10-CM | POA: Insufficient documentation

## 2015-12-11 DIAGNOSIS — Z7984 Long term (current) use of oral hypoglycemic drugs: Secondary | ICD-10-CM | POA: Insufficient documentation

## 2015-12-11 DIAGNOSIS — E119 Type 2 diabetes mellitus without complications: Secondary | ICD-10-CM | POA: Diagnosis not present

## 2015-12-11 DIAGNOSIS — G35 Multiple sclerosis: Secondary | ICD-10-CM | POA: Insufficient documentation

## 2015-12-11 DIAGNOSIS — Z8673 Personal history of transient ischemic attack (TIA), and cerebral infarction without residual deficits: Secondary | ICD-10-CM | POA: Insufficient documentation

## 2015-12-11 DIAGNOSIS — I2510 Atherosclerotic heart disease of native coronary artery without angina pectoris: Secondary | ICD-10-CM | POA: Diagnosis not present

## 2015-12-11 DIAGNOSIS — Z9884 Bariatric surgery status: Secondary | ICD-10-CM | POA: Insufficient documentation

## 2015-12-11 DIAGNOSIS — F329 Major depressive disorder, single episode, unspecified: Secondary | ICD-10-CM | POA: Diagnosis not present

## 2015-12-11 DIAGNOSIS — Z79899 Other long term (current) drug therapy: Secondary | ICD-10-CM | POA: Insufficient documentation

## 2015-12-11 LAB — IRON AND TIBC
Iron: 42 ug/dL (ref 28–170)
SATURATION RATIOS: 20 % (ref 10.4–31.8)
TIBC: 215 ug/dL — AB (ref 250–450)
UIBC: 173 ug/dL

## 2015-12-11 LAB — CBC WITH DIFFERENTIAL/PLATELET
BASOS ABS: 0.1 10*3/uL (ref 0–0.1)
Basophils Relative: 1 %
Eosinophils Absolute: 0.1 10*3/uL (ref 0–0.7)
Eosinophils Relative: 1 %
HEMATOCRIT: 38.2 % (ref 35.0–47.0)
Hemoglobin: 12.5 g/dL (ref 12.0–16.0)
LYMPHS PCT: 15 %
Lymphs Abs: 1.8 10*3/uL (ref 1.0–3.6)
MCH: 26.1 pg (ref 26.0–34.0)
MCHC: 32.7 g/dL (ref 32.0–36.0)
MCV: 79.6 fL — AB (ref 80.0–100.0)
Monocytes Absolute: 0.4 10*3/uL (ref 0.2–0.9)
Monocytes Relative: 4 %
NEUTROS ABS: 9.2 10*3/uL — AB (ref 1.4–6.5)
NEUTROS PCT: 79 %
Platelets: 229 10*3/uL (ref 150–440)
RBC: 4.79 MIL/uL (ref 3.80–5.20)
RDW: 14.9 % — ABNORMAL HIGH (ref 11.5–14.5)
WBC: 11.7 10*3/uL — AB (ref 3.6–11.0)

## 2015-12-11 LAB — FERRITIN: Ferritin: 79 ng/mL (ref 11–307)

## 2015-12-11 NOTE — Telephone Encounter (Signed)
Spoke with pt in reference to RUS results. Pt voiced understanding.  

## 2015-12-12 IMAGING — CR DG CHEST 1V PORT
1 series · 1 of 1 positions shown · non-contrast
Comparison: None.

CLINICAL DATA: 43-year-old with unresponsiveness

EXAM:
PORTABLE CHEST - 1 VIEW

[ap]
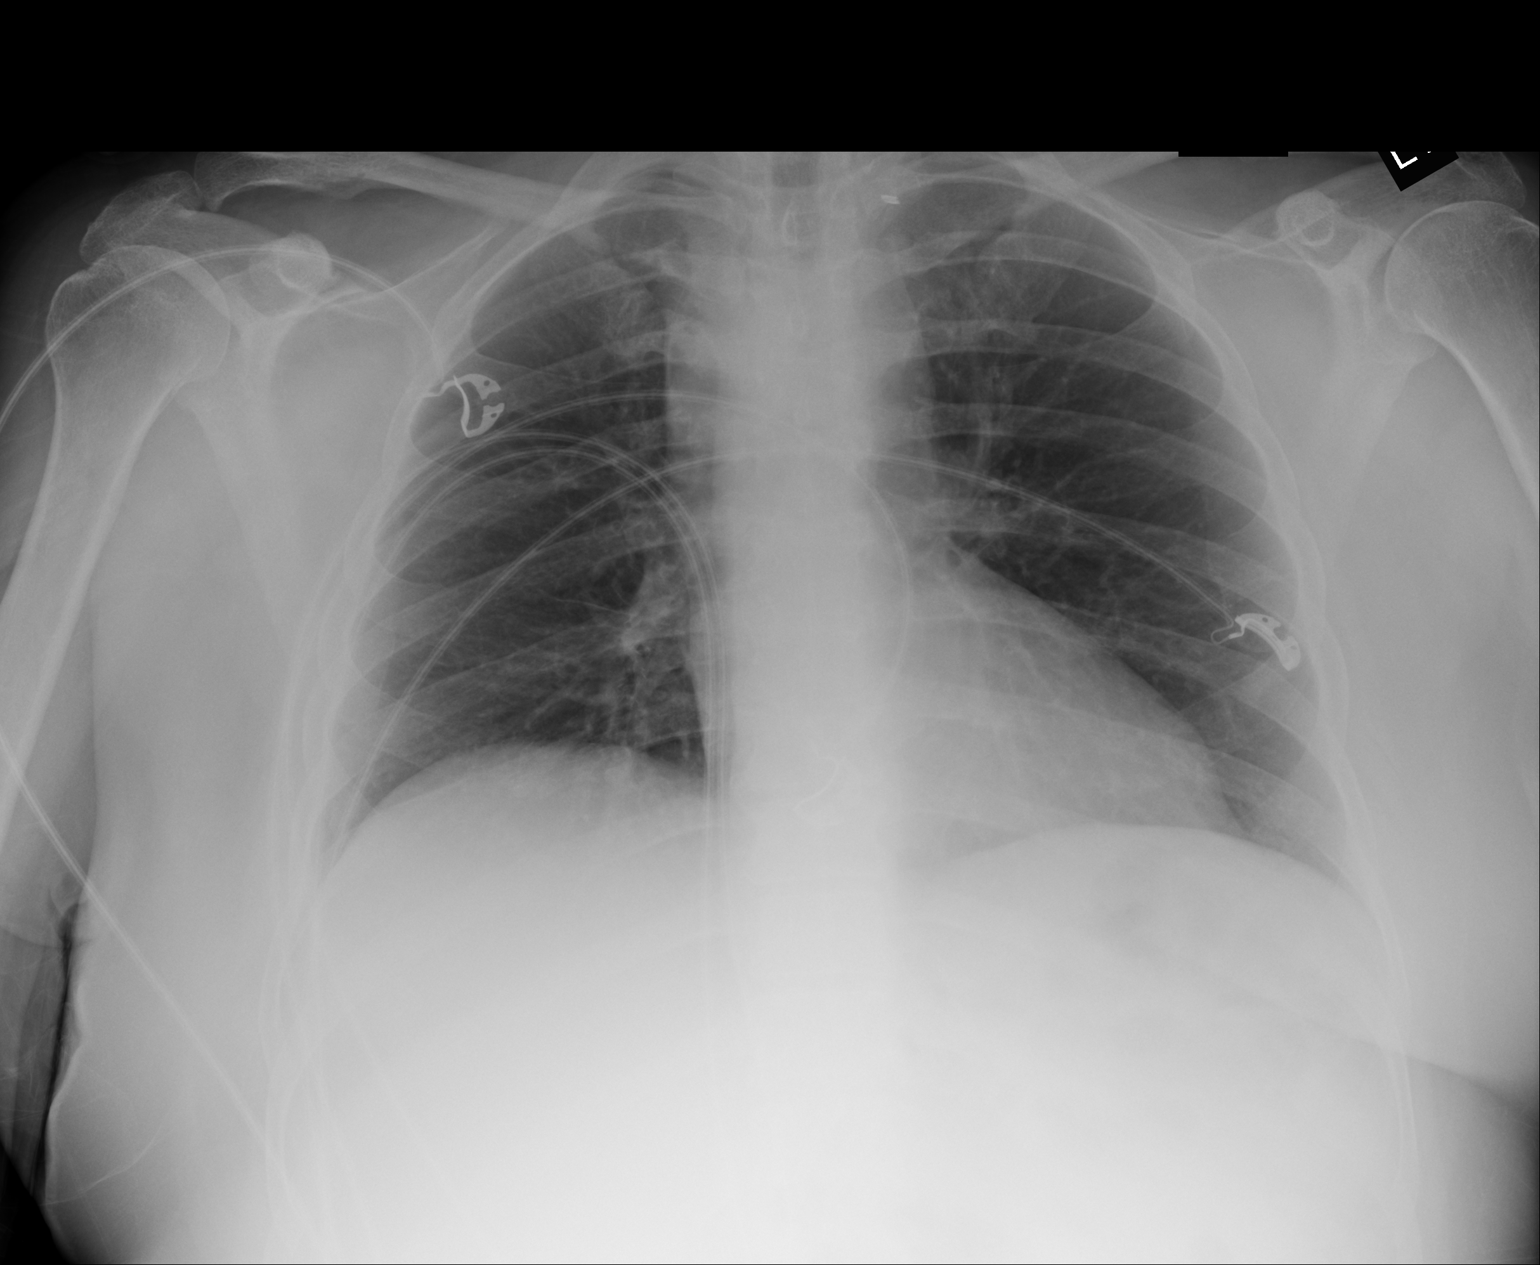

[1 of 1 positions shown; findings below may reference images not displayed]

FINDINGS: The heart size and mediastinal contours are within normal limits.
Both lungs are clear. The visualized skeletal structures are
unremarkable.
IMPRESSION: No active disease.

## 2015-12-12 IMAGING — CT CT HEAD WITHOUT CONTRAST
1 series · 16 of 30 positions shown, 20 images · non-contrast
Comparison: Brain MR and CT head 05/19/2013.

CLINICAL DATA: Found unresponsive.

EXAM:
CT HEAD WITHOUT CONTRAST
TECHNIQUE: Contiguous axial images were obtained from the base of the skull
through the vertex without intravenous contrast.

[Series 2: head wo · axial · 0.38mm/px · z∈[+995,+1121]mm · 16 of 32 slices shown, 20 images]
[im 2/32  brain]
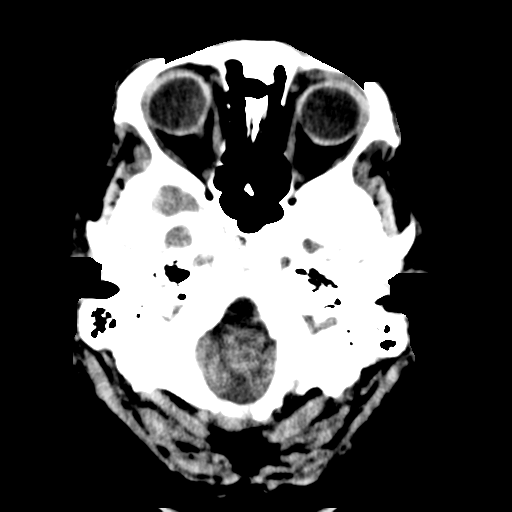
[im 2/32  bone]
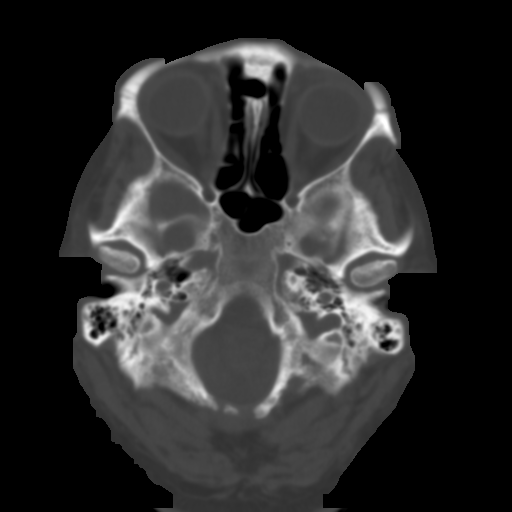
[im 4/32  brain]
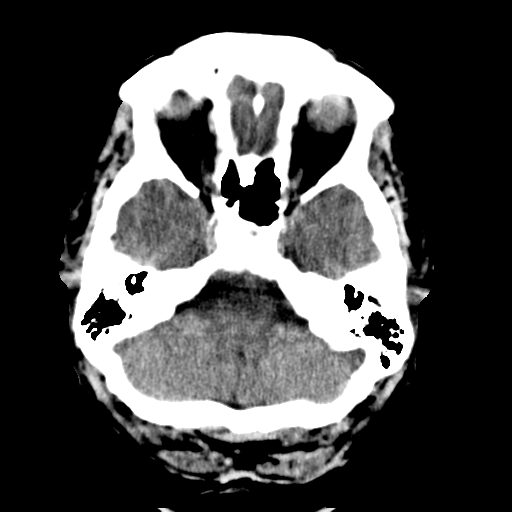
[im 6/32  brain]
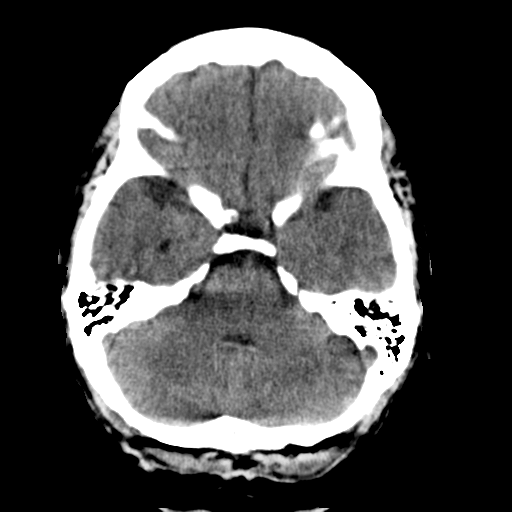
[im 8/32  brain]
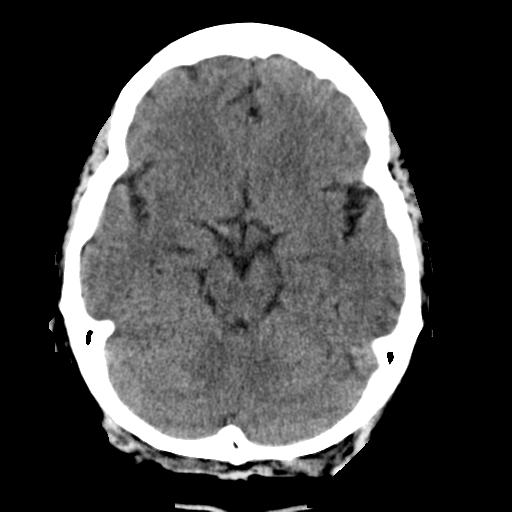
[im 9/32  brain]
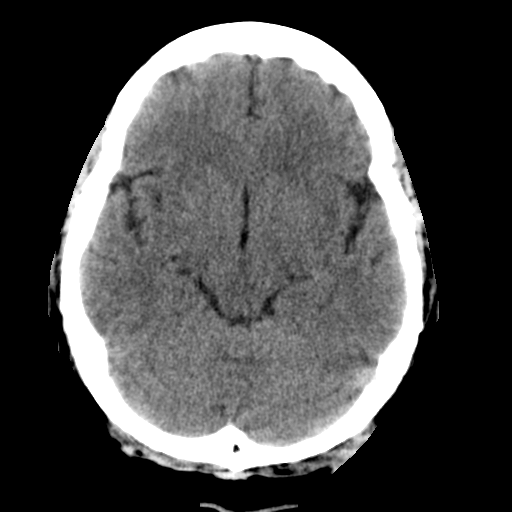
[im 9/32  bone]
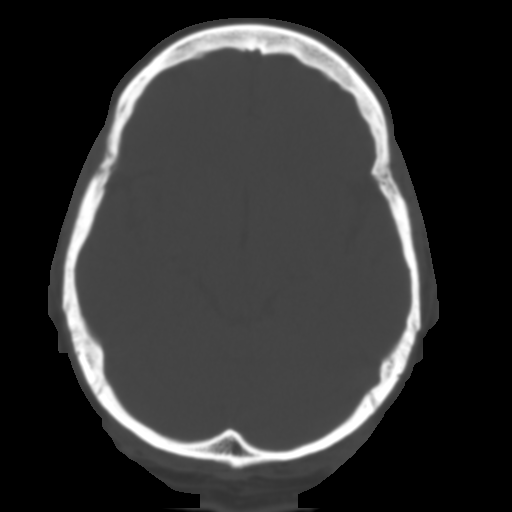
[im 11/32  brain]
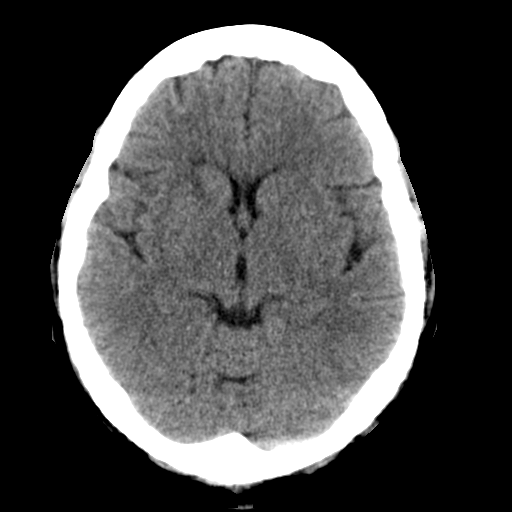
[im 13/32  brain]
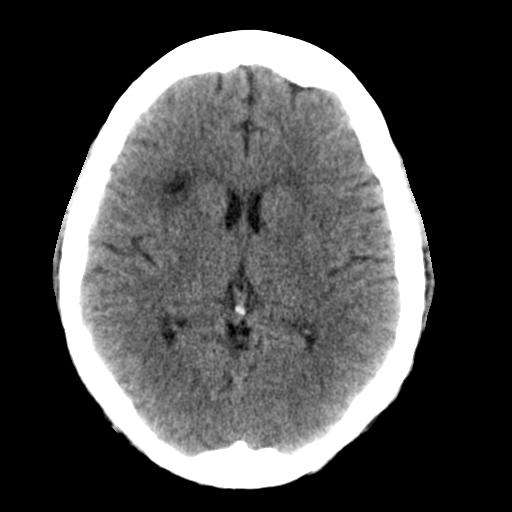
[im 15/32  brain]
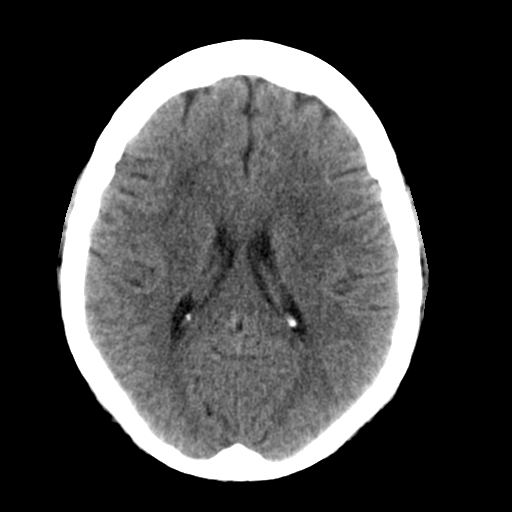
[im 17/32  brain]
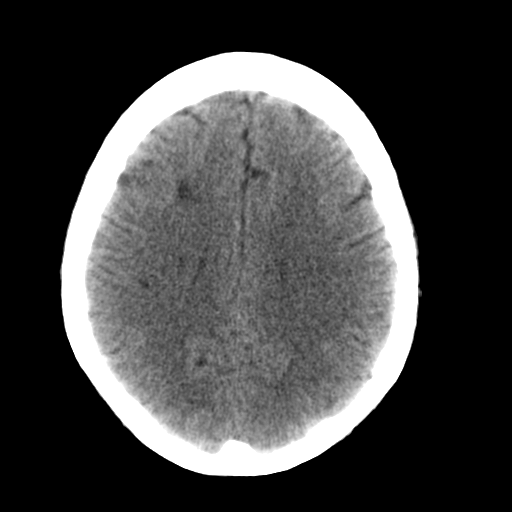
[im 17/32  bone]
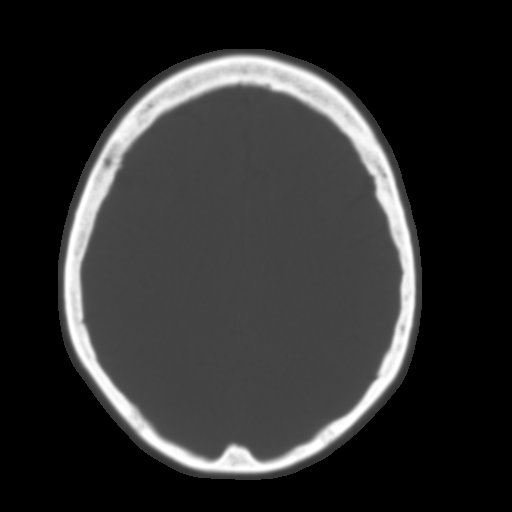
[im 19/32  brain]
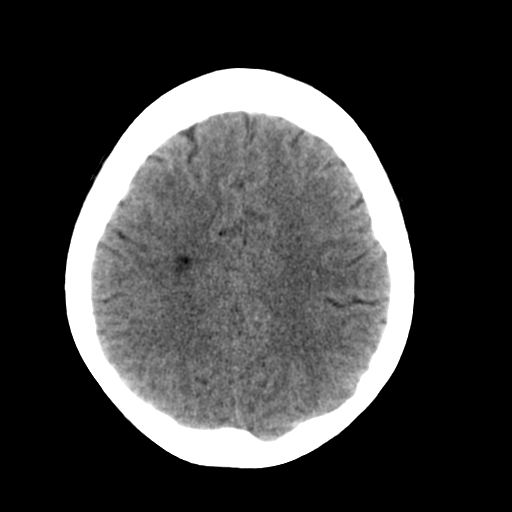
[im 21/32  brain]
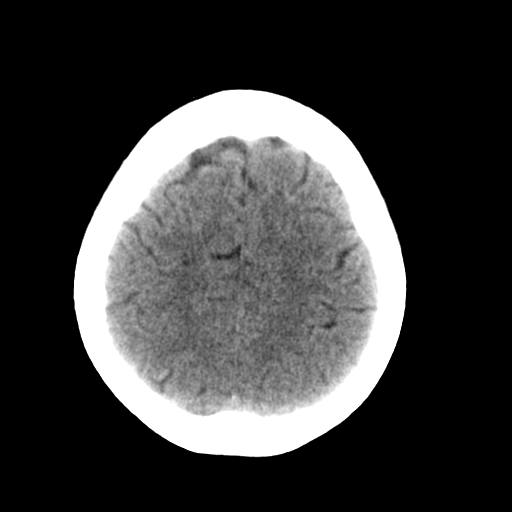
[im 23/32  brain]
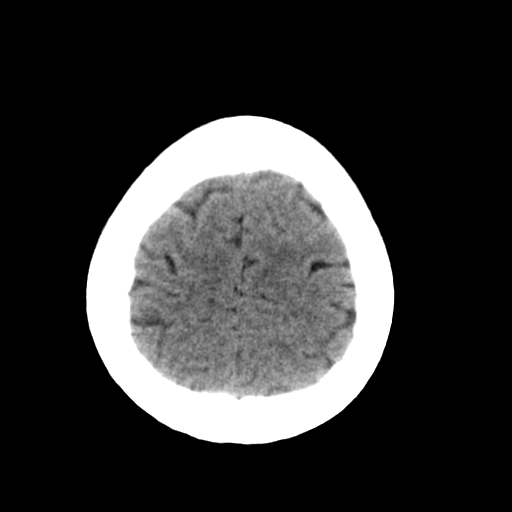
[im 24/32  brain]
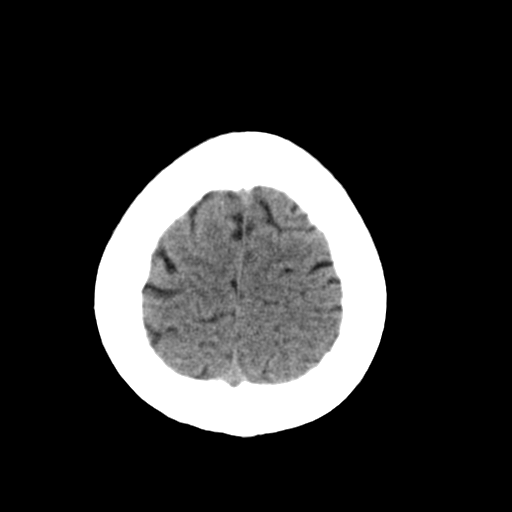
[im 24/32  bone]
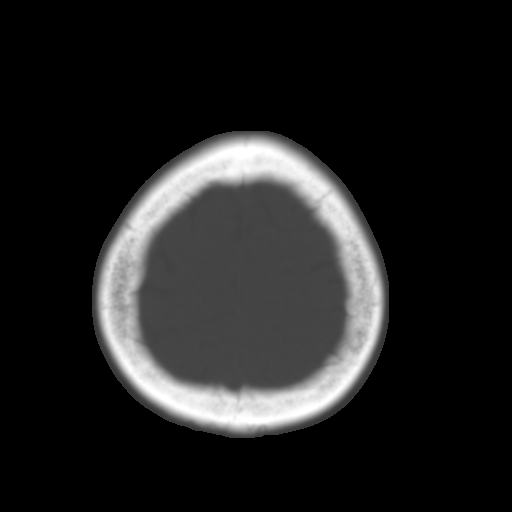
[im 26/32  brain]
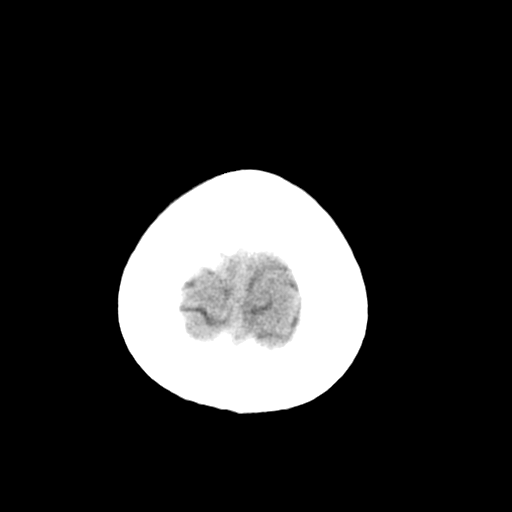
[im 28/32  brain]
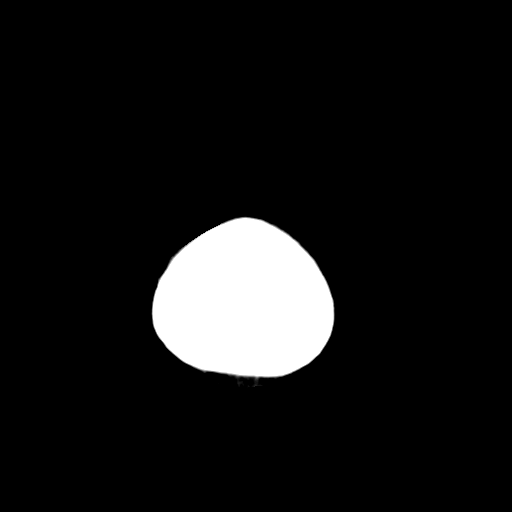
[im 30/32  brain]
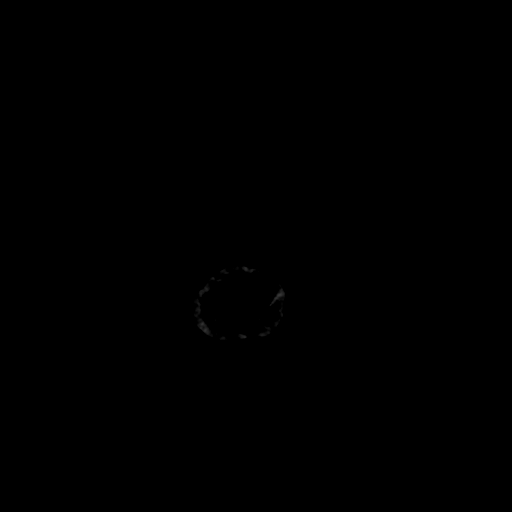

[16 of 30 positions shown; findings below may reference images not displayed]

FINDINGS: No evidence of an acute infarct, acute hemorrhage, mass lesion, mass
effect or hydrocephalus. Patchy areas appear ventricle
low-attenuation, as before. Visualized portions of the paranasal
sinuses and mastoid air cells are clear.
IMPRESSION: 1. No acute intracranial abnormality.
2. Patchy chronic microvascular white matter ischemic changes.

## 2015-12-12 NOTE — Telephone Encounter (Signed)
Pt notified. Per [place].

## 2015-12-13 ENCOUNTER — Other Ambulatory Visit: Payer: Self-pay

## 2015-12-13 ENCOUNTER — Emergency Department: Payer: BC Managed Care – PPO

## 2015-12-13 ENCOUNTER — Ambulatory Visit: Payer: BC Managed Care – PPO | Admitting: Oncology

## 2015-12-13 ENCOUNTER — Ambulatory Visit
Admission: RE | Admit: 2015-12-13 | Discharge: 2015-12-13 | Disposition: A | Discharge status: Home / Self Care | Payer: BC Managed Care – PPO | Source: Ambulatory Visit | Attending: Family Medicine | Admitting: Family Medicine

## 2015-12-13 ENCOUNTER — Observation Stay
Admission: EM | Admit: 2015-12-13 | Discharge: 2015-12-14 | Disposition: A | Discharge status: Home / Self Care | Payer: BC Managed Care – PPO | Attending: Internal Medicine | Admitting: Internal Medicine

## 2015-12-13 DIAGNOSIS — I5022 Chronic systolic (congestive) heart failure: Secondary | ICD-10-CM | POA: Diagnosis not present

## 2015-12-13 DIAGNOSIS — Z1231 Encounter for screening mammogram for malignant neoplasm of breast: Secondary | ICD-10-CM | POA: Insufficient documentation

## 2015-12-13 DIAGNOSIS — I255 Ischemic cardiomyopathy: Secondary | ICD-10-CM | POA: Diagnosis not present

## 2015-12-13 DIAGNOSIS — G35 Multiple sclerosis: Secondary | ICD-10-CM | POA: Diagnosis not present

## 2015-12-13 DIAGNOSIS — Z87891 Personal history of nicotine dependence: Secondary | ICD-10-CM | POA: Insufficient documentation

## 2015-12-13 DIAGNOSIS — I2510 Atherosclerotic heart disease of native coronary artery without angina pectoris: Secondary | ICD-10-CM | POA: Diagnosis not present

## 2015-12-13 DIAGNOSIS — R079 Chest pain, unspecified: Secondary | ICD-10-CM

## 2015-12-13 DIAGNOSIS — Z955 Presence of coronary angioplasty implant and graft: Secondary | ICD-10-CM | POA: Insufficient documentation

## 2015-12-13 DIAGNOSIS — E039 Hypothyroidism, unspecified: Secondary | ICD-10-CM | POA: Diagnosis not present

## 2015-12-13 DIAGNOSIS — F419 Anxiety disorder, unspecified: Secondary | ICD-10-CM | POA: Insufficient documentation

## 2015-12-13 DIAGNOSIS — Z9114 Patient's other noncompliance with medication regimen: Secondary | ICD-10-CM | POA: Diagnosis not present

## 2015-12-13 DIAGNOSIS — N179 Acute kidney failure, unspecified: Secondary | ICD-10-CM | POA: Diagnosis present

## 2015-12-13 DIAGNOSIS — E876 Hypokalemia: Secondary | ICD-10-CM | POA: Insufficient documentation

## 2015-12-13 DIAGNOSIS — I10 Essential (primary) hypertension: Secondary | ICD-10-CM | POA: Diagnosis present

## 2015-12-13 DIAGNOSIS — I252 Old myocardial infarction: Secondary | ICD-10-CM | POA: Diagnosis not present

## 2015-12-13 DIAGNOSIS — E1159 Type 2 diabetes mellitus with other circulatory complications: Secondary | ICD-10-CM | POA: Diagnosis not present

## 2015-12-13 DIAGNOSIS — G8929 Other chronic pain: Secondary | ICD-10-CM | POA: Insufficient documentation

## 2015-12-13 DIAGNOSIS — E1122 Type 2 diabetes mellitus with diabetic chronic kidney disease: Secondary | ICD-10-CM | POA: Insufficient documentation

## 2015-12-13 DIAGNOSIS — E785 Hyperlipidemia, unspecified: Secondary | ICD-10-CM | POA: Insufficient documentation

## 2015-12-13 DIAGNOSIS — Z7982 Long term (current) use of aspirin: Secondary | ICD-10-CM | POA: Insufficient documentation

## 2015-12-13 DIAGNOSIS — I160 Hypertensive urgency: Secondary | ICD-10-CM | POA: Diagnosis not present

## 2015-12-13 DIAGNOSIS — Z7984 Long term (current) use of oral hypoglycemic drugs: Secondary | ICD-10-CM | POA: Insufficient documentation

## 2015-12-13 DIAGNOSIS — Z8249 Family history of ischemic heart disease and other diseases of the circulatory system: Secondary | ICD-10-CM | POA: Insufficient documentation

## 2015-12-13 DIAGNOSIS — I130 Hypertensive heart and chronic kidney disease with heart failure and stage 1 through stage 4 chronic kidney disease, or unspecified chronic kidney disease: Secondary | ICD-10-CM | POA: Diagnosis not present

## 2015-12-13 DIAGNOSIS — Z8673 Personal history of transient ischemic attack (TIA), and cerebral infarction without residual deficits: Secondary | ICD-10-CM | POA: Insufficient documentation

## 2015-12-13 DIAGNOSIS — I25119 Atherosclerotic heart disease of native coronary artery with unspecified angina pectoris: Secondary | ICD-10-CM | POA: Diagnosis present

## 2015-12-13 DIAGNOSIS — R0789 Other chest pain: Secondary | ICD-10-CM | POA: Diagnosis not present

## 2015-12-13 DIAGNOSIS — F329 Major depressive disorder, single episode, unspecified: Secondary | ICD-10-CM | POA: Diagnosis not present

## 2015-12-13 DIAGNOSIS — N184 Chronic kidney disease, stage 4 (severe): Secondary | ICD-10-CM | POA: Diagnosis not present

## 2015-12-13 DIAGNOSIS — Z6830 Body mass index (BMI) 30.0-30.9, adult: Secondary | ICD-10-CM | POA: Insufficient documentation

## 2015-12-13 DIAGNOSIS — Z7902 Long term (current) use of antithrombotics/antiplatelets: Secondary | ICD-10-CM | POA: Insufficient documentation

## 2015-12-13 DIAGNOSIS — K219 Gastro-esophageal reflux disease without esophagitis: Secondary | ICD-10-CM | POA: Diagnosis not present

## 2015-12-13 DIAGNOSIS — E1140 Type 2 diabetes mellitus with diabetic neuropathy, unspecified: Secondary | ICD-10-CM | POA: Insufficient documentation

## 2015-12-13 DIAGNOSIS — Z85820 Personal history of malignant melanoma of skin: Secondary | ICD-10-CM | POA: Insufficient documentation

## 2015-12-13 DIAGNOSIS — M25511 Pain in right shoulder: Secondary | ICD-10-CM | POA: Insufficient documentation

## 2015-12-13 DIAGNOSIS — I25118 Atherosclerotic heart disease of native coronary artery with other forms of angina pectoris: Secondary | ICD-10-CM | POA: Diagnosis present

## 2015-12-13 DIAGNOSIS — Z9884 Bariatric surgery status: Secondary | ICD-10-CM | POA: Insufficient documentation

## 2015-12-13 DIAGNOSIS — Z79899 Other long term (current) drug therapy: Secondary | ICD-10-CM | POA: Insufficient documentation

## 2015-12-13 DIAGNOSIS — N189 Chronic kidney disease, unspecified: Secondary | ICD-10-CM

## 2015-12-13 LAB — CBC
HCT: 35.9 % (ref 35.0–47.0)
HEMOGLOBIN: 11.8 g/dL — AB (ref 12.0–16.0)
MCH: 26.4 pg (ref 26.0–34.0)
MCHC: 33 g/dL (ref 32.0–36.0)
MCV: 79.8 fL — AB (ref 80.0–100.0)
PLATELETS: 221 10*3/uL (ref 150–440)
RBC: 4.49 MIL/uL (ref 3.80–5.20)
RDW: 14.8 % — ABNORMAL HIGH (ref 11.5–14.5)
WBC: 14.7 10*3/uL — AB (ref 3.6–11.0)

## 2015-12-13 LAB — COMPREHENSIVE METABOLIC PANEL
ALK PHOS: 73 U/L (ref 38–126)
ALT: 16 U/L (ref 14–54)
AST: 21 U/L (ref 15–41)
Albumin: 3.7 g/dL (ref 3.5–5.0)
Anion gap: 10 (ref 5–15)
BUN: 24 mg/dL — AB (ref 6–20)
CALCIUM: 8.4 mg/dL — AB (ref 8.9–10.3)
CHLORIDE: 102 mmol/L (ref 101–111)
CO2: 25 mmol/L (ref 22–32)
CREATININE: 1.94 mg/dL — AB (ref 0.44–1.00)
GFR, EST AFRICAN AMERICAN: 34 mL/min — AB (ref >60)
GFR, EST NON AFRICAN AMERICAN: 30 mL/min — AB (ref >60)
Glucose, Bld: 122 mg/dL — ABNORMAL HIGH (ref 65–99)
Potassium: 2.9 mmol/L — ABNORMAL LOW (ref 3.5–5.1)
SODIUM: 137 mmol/L (ref 135–145)
Total Bilirubin: 0.4 mg/dL (ref 0.3–1.2)
Total Protein: 6.9 g/dL (ref 6.5–8.1)

## 2015-12-13 LAB — TROPONIN I: Troponin I: <0.03 ng/mL (ref <0.03)

## 2015-12-13 NOTE — ED Notes (Addendum)
Pt assisted to the restroom with a steady gait. No increased work of breathing or distress noted at this time.

## 2015-12-13 NOTE — ED Provider Notes (Signed)
[facility] Emergency Department Provider Note  Time seen: 10:39 PM  I have reviewed the triage vital signs and the nursing notes.   HISTORY  Chief Complaint Chest Pain    HPI [name] is a 45 y.o. female with multiple medical problems including cardiac stents who presents to the emergency department with left-sided chest pain. According to the patient several hours ago she began experiencing left-sided chest pain with radiation down her left arm associated with nausea. Patient went to the fire department for evaluation, they checked her blood pressure nose guard 196 222 systolic with continued left chest pain, the patient was brought to the emergency department via EMS. EMS state in route to the emergency department the patient had a 6 second run of ventricular tachycardia. Patient received 2 sprays of nitroglycerin and 324 mg of aspirin. Reducing her chest pain from a 10/10 to a 3/10. Patient continues to state mild chest pressure but states the nausea and left arm pain have resolved.  Past Medical History:  Diagnosis Date  . Anxiety   . Aortic arch aneurysm ([place])   . CAD (coronary artery disease)    a. cath 08/2003 nonobs dzs;  b. 11/2014: mLAD 85-90% s/p PCI/DES (Xience 3.25 x 23); c. cath 04/2015: 70% mD1 @ bifurcation. Patent LAD stent - > Med Rx.; d. Cath-PCI 5/17: Patent LAD stent, D1 now ~80% - PCI Resolute DES 2.25 x 12.    . Chronic systolic CHF (congestive heart failure) ([place])    a. echo 01/29/2013 EF 40-45%, basal anteroseptal & mid anteroseptal segments abnl; b echo 05/2012 EF 50-55%, mildly dilated LA, nl RVSP; c. 10/2015 Echo: EF 55-60%, mild conc LVH, no rwma.  . CKD (chronic kidney disease), stage IV ([place])   . Colon polyp   . CVA (cerebral vascular accident) ([place])    a. 06/2008-TEE nl LV fxn; b. 01/2013 - notes indicate short run of SVT at that time  . Depression   . Diabetes mellitus without complication ([place])   . Gastric ulcer 04/27/2011   . GERD (gastroesophageal reflux disease)   . Headache    Migrains  . Hyperlipidemia   . Hypertension   . Hypertensive heart disease   . Hypothyroidism   . Ischemic cardiomyopathy    a. EF prev 40-45% in 2014-->Improved to 55-60% (10/2015).  . Malignant melanoma of skin of scalp ([place])   . Morbid obesity ([place])    a. s/p gastric bypass.  . Multiple sclerosis ([place]) 2015  . Orthostatic hypotension   . Syncope     Patient Active Problem List   Diagnosis Date Noted  . Nausea & vomiting 11/07/2015  . Sepsis ([place]) 10/30/2015  . Acute pyelonephritis   . Acute renal insufficiency   . Diaphoresis   . Emesis   . CKD (chronic kidney disease), stage IV ([place])   . CAD (coronary artery disease)   . Hypertensive heart disease   . CKD (chronic kidney disease) stage 3, GFR 30-59 ml/min 06/05/2015  . Presence of drug coated stent in LAD coronary artery & now in D1 06/04/2015  . MI, acute, non ST segment elevation ([place])   . Coronary artery disease involving native coronary artery of native heart with angina pectoris with documented spasm ([place])   . Uncontrolled type 2 diabetes mellitus with circulatory disorder ([place])   . NSTEMI (non-ST elevated myocardial infarction) ([place]) 06/03/2015  . Colitis 06/03/2015  . Carotid stenosis 04/30/2015  . Type 2 diabetes mellitus with complication, without  long-term current use of insulin ([place])   . Pain in the chest   . UTI (lower urinary tract infection) 04/05/2015  . Iron deficiency anemia 03/22/2015  . Depression 02/22/2015  . Diabetes type 2, controlled ([place]) 02/19/2015  . Vitamin B12 deficiency 02/18/2015  . Non compliance w medication regimen 02/18/2015  . Misuse of medications for pain 02/18/2015  . Chest pain 02/16/2015  . Severe recurrent major depression without psychotic features ([place]) 02/15/2015  . Helicobacter pylori infection 11/23/2014  . Hemiparesis, left ([place]) 11/23/2014  . Benign neoplasm of colon 11/20/2014  . Coronary artery disease  involving native coronary artery of native heart with unstable angina pectoris ([place])   . Malignant melanoma ([place]) 08/25/2014  . Chronic kidney disease (CKD), stage III (moderate) 08/25/2014  . Chronic systolic CHF (congestive heart failure) ([place])   . Incomplete bladder emptying 07/12/2014  . Adult hypothyroidism 12/30/2013  . Aberrant subclavian artery 11/17/2013  . Multiple sclerosis ([place]) 11/02/2013  . Cerebrovascular accident (CVA) ([place]) 06/20/2013  . Headache, migraine 05/29/2013  . Diabetes mellitus ([place]) 05/29/2013  . Type 2 diabetes mellitus ([place]) 12/12/2012  . Hyperlipidemia   . GERD (gastroesophageal reflux disease)   . Neuropathy ([place]) 01/02/2011  . CVA (cerebral vascular accident) ([place]) 06/21/2008  . Essential hypertension 05/01/2008    Past Surgical History:  Procedure Laterality Date  . APPENDECTOMY    . CARDIAC CATHETERIZATION N/A 11/09/2014   Procedure: Coronary Angiography;  Surgeon: Minna Merritts, MD;  Location: [place] [facility];  Service: Cardiovascular;  Laterality: N/A;  . CARDIAC CATHETERIZATION N/A 11/12/2014   Procedure: Coronary Stent Intervention;  Surgeon: Isaias Cowman, MD;  Location: [place] [facility];  Service: Cardiovascular;  Laterality: N/A;  . CARDIAC CATHETERIZATION N/A 04/18/2015   Procedure: Left Heart Cath and Coronary Angiography;  Surgeon: Minna Merritts, MD;  Location: [facility] [facility];  Service: Cardiovascular;  Laterality: N/A;  . CARDIAC CATHETERIZATION Left 06/04/2015   Procedure: Left Heart Cath and Coronary Angiography;  Surgeon: Wellington Hampshire, MD;  Location: [facility];  Service: Cardiovascular;  Laterality: Left;  . CARDIAC CATHETERIZATION N/A 06/04/2015   Procedure: Coronary Stent Intervention;  Surgeon: Wellington Hampshire, MD;  Location: [place] [facility];  Service: Cardiovascular;  Laterality: N/A;  . DILATION AND CURETTAGE OF UTERUS    . ESOPHAGOGASTRODUODENOSCOPY (EGD) WITH PROPOFOL N/A 09/14/2014    Procedure: ESOPHAGOGASTRODUODENOSCOPY (EGD) WITH PROPOFOL;  Surgeon: Josefine Class, MD;  Location: [facility] ENDOSCOPY;  Service: Endoscopy;  Laterality: N/A;  . GASTRIC BYPASS  09/2009   [facility]   . Left Carotid to sublcavian artery bypass w/ subclavian artery ligation     a. Performed @ [facility].  . MELANOMA EXCISION  2016   Dr. Evorn Gong  . RIGHT OOPHORECTOMY    . TRIGGER FINGER RELEASE    . TUBAL LIGATION     R tube removed still w/ Left    Prior to Admission medications   Medication Sig Start Date End Date Taking? Authorizing Provider  aspirin EC 81 MG tablet Take 1 tablet (81 mg total) by mouth daily. 02/19/15   Hildred Priest, MD  baclofen (LIORESAL) 10 MG tablet Take 1 tablet (10 mg total) by mouth 2 (two) times daily as needed for muscle spasms. 02/19/15   Hildred Priest, MD  citalopram (CELEXA) 40 MG tablet Take 1 tablet (40 mg total) by mouth daily. 11/15/15   [name], MD  clopidogrel (PLAVIX) 75 MG tablet Take 75 mg by  mouth daily.    Historical Provider, MD  diltiazem (CARDIZEM SR) 120 MG 12 hr capsule Take 120 mg by mouth daily.    Historical Provider, MD  eszopiclone (LUNESTA) 2 MG TABS tablet Take 1 tablet (2 mg total) by mouth at bedtime as needed for sleep. Take immediately before bedtime 08/14/15   [name], MD  furosemide (LASIX) 20 MG tablet Take 20 mg by mouth daily.    Historical Provider, MD  gabapentin (NEURONTIN) 300 MG capsule Take 300 mg by mouth 3 (three) times daily.    Historical Provider, MD  isosorbide mononitrate (IMDUR) 60 MG 24 hr tablet Take 60 mg by mouth daily.    Historical Provider, MD  levothyroxine (SYNTHROID, LEVOTHROID) 25 MCG tablet Take 1 tablet (25 mcg total) by mouth daily before breakfast. 04/18/15   Gladstone Lighter, MD  losartan (COZAAR) 25 MG tablet Take 25 mg by mouth daily. 05/05/15   Historical Provider, MD  metFORMIN (GLUCOPHAGE) 1000 MG tablet Take 1,000 mg by mouth 2 (two) times daily  with a meal.  05/09/15   Historical Provider, MD  nitroGLYCERIN (NITROSTAT) 0.4 MG SL tablet Place 1 tablet (0.4 mg total) under the tongue every 5 (five) minutes as needed for chest pain. Patient not taking: Reported on 11/22/2015 04/30/15   Minna Merritts, MD  pantoprazole (PROTONIX) 40 MG tablet Take 1 tablet (40 mg total) by mouth 2 (two) times daily. 04/18/15   Gladstone Lighter, MD  promethazine (PHENERGAN) 25 MG tablet Take 1 tablet (25 mg total) by mouth every 6 (six) hours as needed for nausea or vomiting. Reported on 05/31/2015 11/15/15   [name], MD  rosuvastatin (CRESTOR) 40 MG tablet Take 1 tablet (40 mg total) by mouth daily at 6 PM. 04/30/15   Minna Merritts, MD  traMADol (ULTRAM) 50 MG tablet Take 1 tablet (50 mg total) by mouth every 8 (eight) hours as needed. Patient taking differently: Take 50 mg by mouth every 8 (eight) hours as needed.  11/20/15   [name], MD  traZODone (DESYREL) 100 MG tablet Take 2 tablets (200 mg total) by mouth at bedtime. 12/02/15   [name], MD    No Known Allergies  Family History  Problem Relation Age of Onset  . Hypertension Mother   . Anxiety disorder Mother   . Depression Mother   . Bipolar disorder Mother   . Heart disease Mother   . Hyperlipidemia Mother   . Kidney disease Father   . Heart disease Father   . Hypertension Father   . Diabetes Father   . Stroke Father   . Colon cancer Father     dx in his 16's  . Anxiety disorder Father   . Depression Father   . Skin cancer Father   . Kidney disease Sister   . Thyroid nodules Sister   . Hypertension Sister   . Hypertension Sister   . Diabetes Sister   . Hyperlipidemia Sister   . Depression Sister   . Breast cancer Maternal Aunt   . Breast cancer Maternal Aunt     Social History Social History  Substance Use Topics  . Smoking status: Former Smoker    Types: Cigarettes    Quit date: 08/31/1994  . Smokeless tobacco: Never Used     Comment: quit 28 years  ago  . Alcohol use No    Review of Systems Constitutional: Negative for fever. Cardiovascular: Positive for chest pain, so she would nausea and  left arm radiation. Respiratory: Negative for shortness of breath. Gastrointestinal: Negative for abdominal pain Neurological: Negative for headache 10-point ROS otherwise negative.  ____________________________________________   PHYSICAL EXAM:  VITAL SIGNS: ED Triage Vitals  Enc Vitals Group     BP 12/13/15 2200 (!) 178/103     Pulse Rate 12/13/15 2200 99     Resp 12/13/15 2200 18     Temp 12/13/15 2209 97.8 F (36.6 C)     Temp Source 12/13/15 2209 Oral     SpO2 12/13/15 2200 96 %     Weight 12/13/15 2209 169 lb (76.7 kg)     Height 12/13/15 2209 5\' 3"  (1.6 m)     Head Circumference --      Peak Flow --      Pain Score 12/13/15 2210 3     Pain Loc --      Pain Edu? --      Excl. in [place]? --     Constitutional: Alert and oriented. Well appearing and in no distress. Eyes: Normal exam ENT   Head: Normocephalic and atraumatic.   Mouth/Throat: Mucous membranes are moist. Cardiovascular: Normal rate, regular rhythm. No murmur Respiratory: Normal respiratory effort without tachypnea nor retractions. Breath sounds are clear and equal bilaterally.  Gastrointestinal: Soft and nontender. No distention.  Musculoskeletal: Nontender with normal range of motion in all extremities. Neurologic:  Normal speech and language. No gross focal neurologic deficits Skin:  Skin is warm, dry and intact.  Psychiatric: Mood and affect are normal.   ____________________________________________    EKG  EKG reviewed and interpreted by myself shows normal sinus rhythm at 90 bpm, narrow QRS, normal axis, largely normal intervals, and the patient does have ST depressions most pronounced in the lateral leads which appear to be increased from prior EKGs.  ____________________________________________    RADIOLOGY  Chest x-ray  negative  ____________________________________________   INITIAL IMPRESSION / ASSESSMENT AND PLAN / ED COURSE  Pertinent labs & imaging results that were available during my care of the patient were reviewed by me and considered in my medical decision making (see chart for details).  Patient presents the emergency department with significant chest pain with left arm radiation, nausea and 80 pressure sensation to the chest. Largely resolved after 2 sprays of nitroglycerin and aspirin administration by EMS. EMS does state the patient had a 6 second run of ventricular tachycardia in route to the hospital. Given the patient's significant medical history in concerning chest pain we will admit the patient to the hospital for further evaluation and workup.  ____________________________________________   FINAL CLINICAL IMPRESSION(S) / ED DIAGNOSES  Chest pain    Harvest Dark, MD 12/13/15 2245

## 2015-12-13 NOTE — ED Triage Notes (Signed)
Pt brought in via ems for mid sternal chest pain that radiates into left arm - pt reports pain as pressure - c/o nausea - pt states history of CVA/MI/HTN/DM/aortic arch aneurysm - during transport to er pt had a 6 second run of VTach per ems - pt was giv en 324mg  of asa and ntg spray x2 with some relief from a 10/10 pain to a 3/10 pain - CBG 240

## 2015-12-13 NOTE — ED Notes (Addendum)
Pt brought in via ems for mid sternal chest pain that radiates into left arm - pt reports pain as pressure - c/o nausea - pt states history of CVA/MI/HTN/DM/aortic arch aneurysm - during transport to er pt had a 6 second run of VTach per ems - pt was giv en 324mg  of asa and ntg spray x2 with some relief from a 10/10 pain to a 3/10 pain - CBG 240 - pt denies shortness of breath - pt was in the hospital Sept 25 and again Sept 30 for "severe UTI and E. Coli"

## 2015-12-14 ENCOUNTER — Encounter: Payer: Self-pay | Admitting: *Deleted

## 2015-12-14 DIAGNOSIS — I10 Essential (primary) hypertension: Secondary | ICD-10-CM

## 2015-12-14 DIAGNOSIS — R0789 Other chest pain: Secondary | ICD-10-CM

## 2015-12-14 DIAGNOSIS — N183 Chronic kidney disease, stage 3 (moderate): Secondary | ICD-10-CM

## 2015-12-14 DIAGNOSIS — N189 Chronic kidney disease, unspecified: Secondary | ICD-10-CM

## 2015-12-14 DIAGNOSIS — E876 Hypokalemia: Secondary | ICD-10-CM

## 2015-12-14 DIAGNOSIS — N179 Acute kidney failure, unspecified: Secondary | ICD-10-CM | POA: Diagnosis present

## 2015-12-14 DIAGNOSIS — I25111 Atherosclerotic heart disease of native coronary artery with angina pectoris with documented spasm: Secondary | ICD-10-CM | POA: Diagnosis not present

## 2015-12-14 DIAGNOSIS — Z9114 Patient's other noncompliance with medication regimen: Secondary | ICD-10-CM

## 2015-12-14 LAB — POTASSIUM: Potassium: 4 mmol/L (ref 3.5–5.1)

## 2015-12-14 LAB — GLUCOSE, CAPILLARY
Glucose-Capillary: 104 mg/dL — ABNORMAL HIGH (ref 65–99)
Glucose-Capillary: 257 mg/dL — ABNORMAL HIGH (ref 65–99)

## 2015-12-14 LAB — TSH: TSH: 3.168 u[IU]/mL (ref 0.350–4.500)

## 2015-12-14 LAB — TROPONIN I
Troponin I: <0.03 ng/mL (ref <0.03)
Troponin I: <0.03 ng/mL (ref <0.03)

## 2015-12-14 LAB — MAGNESIUM: Magnesium: 1.1 mg/dL — ABNORMAL LOW (ref 1.7–2.4)

## 2015-12-14 MED ORDER — CITALOPRAM HYDROBROMIDE 20 MG PO TABS
40.0000 mg | ORAL_TABLET | Freq: Every day | ORAL | Status: DC
  Administered 2015-12-14: 40 mg via ORAL
  Filled 2015-12-14: qty 2

## 2015-12-14 MED ORDER — LABETALOL HCL 5 MG/ML IV SOLN
5.0000 mg | INTRAVENOUS | Status: DC | PRN
  Administered 2015-12-14: 5 mg via INTRAVENOUS
  Filled 2015-12-14: qty 4

## 2015-12-14 MED ORDER — LABETALOL HCL 5 MG/ML IV SOLN: 5.0000 mg | Freq: Once | INTRAVENOUS | Status: DC

## 2015-12-14 MED ORDER — HEPARIN SODIUM (PORCINE) 5000 UNIT/ML IJ SOLN
5000.0000 [IU] | Freq: Three times a day (TID) | INTRAMUSCULAR | Status: DC
  Administered 2015-12-14: 5000 [IU] via SUBCUTANEOUS
  Filled 2015-12-14: qty 1

## 2015-12-14 MED ORDER — SODIUM CHLORIDE 0.9% FLUSH
3.0000 mL | Freq: Two times a day (BID) | INTRAVENOUS | Status: DC
  Administered 2015-12-14: 3 mL via INTRAVENOUS

## 2015-12-14 MED ORDER — POTASSIUM CHLORIDE CRYS ER 20 MEQ PO TBCR
40.0000 meq | EXTENDED_RELEASE_TABLET | Freq: Once | ORAL | Status: AC
  Administered 2015-12-14: 40 meq via ORAL
  Filled 2015-12-14: qty 2

## 2015-12-14 MED ORDER — PROMETHAZINE HCL 25 MG PO TABS: 25.0000 mg | ORAL_TABLET | Freq: Four times a day (QID) | ORAL | Status: DC | PRN

## 2015-12-14 MED ORDER — POTASSIUM CHLORIDE IN NACL 40-0.9 MEQ/L-% IV SOLN
INTRAVENOUS | Status: DC
  Administered 2015-12-14 (×2): 125 mL/h via INTRAVENOUS
  Filled 2015-12-14 (×4): qty 1000

## 2015-12-14 MED ORDER — LOSARTAN POTASSIUM 50 MG PO TABS
50.0000 mg | ORAL_TABLET | Freq: Every day | ORAL | Status: DC
  Administered 2015-12-14: 50 mg via ORAL

## 2015-12-14 MED ORDER — NITROGLYCERIN 2 % TD OINT
0.5000 [in_us] | TOPICAL_OINTMENT | Freq: Four times a day (QID) | TRANSDERMAL | Status: DC
  Administered 2015-12-14 (×2): 0.5 [in_us] via TOPICAL
  Filled 2015-12-14 (×3): qty 1

## 2015-12-14 MED ORDER — BACLOFEN 10 MG PO TABS: 10.0000 mg | ORAL_TABLET | Freq: Two times a day (BID) | ORAL | Status: DC | PRN

## 2015-12-14 MED ORDER — LEVOTHYROXINE SODIUM 50 MCG PO TABS
25.0000 ug | ORAL_TABLET | Freq: Every day | ORAL | Status: DC
  Administered 2015-12-14: 25 ug via ORAL
  Filled 2015-12-14: qty 1

## 2015-12-14 MED ORDER — ACETAMINOPHEN 650 MG RE SUPP: 650.0000 mg | Freq: Four times a day (QID) | RECTAL | Status: DC | PRN

## 2015-12-14 MED ORDER — LOSARTAN POTASSIUM 50 MG PO TABS: 50.0000 mg | ORAL_TABLET | Freq: Every day | ORAL | 0 refills | Status: DC

## 2015-12-14 MED ORDER — ISOSORBIDE MONONITRATE ER 60 MG PO TB24
60.0000 mg | ORAL_TABLET | Freq: Every day | ORAL | Status: DC
  Administered 2015-12-14: 60 mg via ORAL
  Filled 2015-12-14: qty 1

## 2015-12-14 MED ORDER — ASPIRIN EC 81 MG PO TBEC
81.0000 mg | DELAYED_RELEASE_TABLET | Freq: Every day | ORAL | Status: DC
  Administered 2015-12-14: 81 mg via ORAL
  Filled 2015-12-14: qty 1

## 2015-12-14 MED ORDER — TRAZODONE HCL 100 MG PO TABS: 200.0000 mg | ORAL_TABLET | Freq: Every day | ORAL | Status: DC

## 2015-12-14 MED ORDER — DOCUSATE SODIUM 100 MG PO CAPS
100.0000 mg | ORAL_CAPSULE | Freq: Two times a day (BID) | ORAL | Status: DC
  Filled 2015-12-14: qty 1

## 2015-12-14 MED ORDER — ONDANSETRON HCL 4 MG PO TABS: 4.0000 mg | ORAL_TABLET | Freq: Four times a day (QID) | ORAL | Status: DC | PRN

## 2015-12-14 MED ORDER — GABAPENTIN 300 MG PO CAPS
300.0000 mg | ORAL_CAPSULE | Freq: Three times a day (TID) | ORAL | Status: DC
  Administered 2015-12-14: 300 mg via ORAL
  Filled 2015-12-14: qty 1

## 2015-12-14 MED ORDER — CLOPIDOGREL BISULFATE 75 MG PO TABS
75.0000 mg | ORAL_TABLET | Freq: Every day | ORAL | Status: DC
  Administered 2015-12-14: 75 mg via ORAL
  Filled 2015-12-14: qty 1

## 2015-12-14 MED ORDER — INSULIN ASPART 100 UNIT/ML ~~LOC~~ SOLN: 0.0000 [IU] | Freq: Every day | SUBCUTANEOUS | Status: DC

## 2015-12-14 MED ORDER — INSULIN ASPART 100 UNIT/ML ~~LOC~~ SOLN
0.0000 [IU] | Freq: Three times a day (TID) | SUBCUTANEOUS | Status: DC
  Administered 2015-12-14: 5 [IU] via SUBCUTANEOUS
  Filled 2015-12-14: qty 5

## 2015-12-14 MED ORDER — ROSUVASTATIN CALCIUM 20 MG PO TABS: 40.0000 mg | ORAL_TABLET | Freq: Every day | ORAL | Status: DC

## 2015-12-14 MED ORDER — NITROGLYCERIN 0.4 MG SL SUBL: 0.4000 mg | SUBLINGUAL_TABLET | SUBLINGUAL | Status: DC | PRN

## 2015-12-14 MED ORDER — LOSARTAN POTASSIUM 50 MG PO TABS
25.0000 mg | ORAL_TABLET | Freq: Every day | ORAL | Status: DC
  Filled 2015-12-14: qty 1

## 2015-12-14 MED ORDER — LABETALOL HCL 5 MG/ML IV SOLN: 10.0000 mg | Freq: Once | INTRAVENOUS | Status: DC

## 2015-12-14 MED ORDER — ONDANSETRON HCL 4 MG/2ML IJ SOLN: 4.0000 mg | Freq: Four times a day (QID) | INTRAMUSCULAR | Status: DC | PRN

## 2015-12-14 MED ORDER — MAGNESIUM SULFATE 4 GM/100ML IV SOLN
4.0000 g | Freq: Once | INTRAVENOUS | Status: DC
  Filled 2015-12-14: qty 100

## 2015-12-14 MED ORDER — ZOLPIDEM TARTRATE 5 MG PO TABS: 5.0000 mg | ORAL_TABLET | Freq: Every evening | ORAL | Status: DC | PRN

## 2015-12-14 MED ORDER — DILTIAZEM HCL ER 60 MG PO CP12
120.0000 mg | ORAL_CAPSULE | Freq: Every day | ORAL | Status: DC
  Administered 2015-12-14: 120 mg via ORAL
  Filled 2015-12-14: qty 2

## 2015-12-14 MED ORDER — ACETAMINOPHEN 325 MG PO TABS: 650.0000 mg | ORAL_TABLET | Freq: Four times a day (QID) | ORAL | Status: DC | PRN

## 2015-12-14 MED ORDER — FUROSEMIDE 40 MG PO TABS
20.0000 mg | ORAL_TABLET | Freq: Every day | ORAL | Status: DC
  Filled 2015-12-14: qty 1

## 2015-12-14 MED ORDER — PANTOPRAZOLE SODIUM 40 MG PO TBEC
40.0000 mg | DELAYED_RELEASE_TABLET | Freq: Two times a day (BID) | ORAL | Status: DC
  Administered 2015-12-14: 40 mg via ORAL
  Filled 2015-12-14: qty 1

## 2015-12-14 NOTE — Progress Notes (Signed)
Dr. Benjie Karvonen made aware of pt's MG level of 1.1, new orders receieved.

## 2015-12-14 NOTE — H&P (Signed)
Kendra [address] [name] is an 45 y.o. female.   Chief Complaint: Chest pain HPI: The patient with past medical history of coronary artery disease status post myocardial infarction as well as stenting, and CK D presents emergency department complaining of chest pain. The patient states that she had been up since [place] helping in the kitchen at a community event. She was starting a large pot of stool when she felt centralized chest pressure. She became slightly dizzy and nauseous. She took nitroglycerin spray which did very little to relieve her chest pressure. She was taken to the emergency department and found to have a systolic blood pressure greater than 200. Nitropaste was applied to the patient's chest and her pain decreased. She reports the pain seems to move to her left shoulder. This pain is not to be confused with her intermittent chronic right shoulder pain which is secondary to her surgically repaired rotator cuff. Also of note, EMS reports the patient had a 6 beat run of ventricular tachycardia en route to the hospital but this was not captured on a telemetry strip. Following stabilization in the emergency department the patient was admitted for rule out myocardial ischemia.  Past Medical History:  Diagnosis Date  . Anxiety   . Aortic arch aneurysm ([place])   . CAD (coronary artery disease)    a. cath 08/2003 nonobs dzs;  b. 11/2014: mLAD 85-90% s/p PCI/DES (Xience 3.25 x 23); c. cath 04/2015: 70% mD1 @ bifurcation. Patent LAD stent - > Med Rx.; d. Cath-PCI 5/17: Patent LAD stent, D1 now ~80% - PCI Resolute DES 2.25 x 12.    . Chronic systolic CHF (congestive heart failure) ([place])    a. echo 01/29/2013 EF 40-45%, basal anteroseptal & mid anteroseptal segments abnl; b echo 05/2012 EF 50-55%, mildly dilated LA, nl RVSP; c. 10/2015 Echo: EF 55-60%, mild conc LVH, no rwma.  . CKD (chronic kidney disease), stage IV ([place])   . Colon polyp   . CVA (cerebral vascular accident) ([place])    a. 06/2008-TEE nl LV fxn; b.  01/2013 - notes indicate short run of SVT at that time  . Depression   . Diabetes mellitus without complication ([place])   . Gastric ulcer 04/27/2011  . GERD (gastroesophageal reflux disease)   . Headache    Migrains  . Hyperlipidemia   . Hypertension   . Hypertensive heart disease   . Hypothyroidism   . Ischemic cardiomyopathy    a. EF prev 40-45% in 2014-->Improved to 55-60% (10/2015).  . Malignant melanoma of skin of scalp ([place])   . Morbid obesity ([place])    a. s/p gastric bypass.  . Multiple sclerosis ([place]) 2015  . Orthostatic hypotension   . Syncope     Past Surgical History:  Procedure Laterality Date  . APPENDECTOMY    . CARDIAC CATHETERIZATION N/A 11/09/2014   Procedure: Coronary Angiography;  Surgeon: Minna Merritts, MD;  Location: [facility];  Service: Cardiovascular;  Laterality: N/A;  . CARDIAC CATHETERIZATION N/A 11/12/2014   Procedure: Coronary Stent Intervention;  Surgeon: Isaias Cowman, MD;  Location: [facility];  Service: Cardiovascular;  Laterality: N/A;  . CARDIAC CATHETERIZATION N/A 04/18/2015   Procedure: Left Heart Cath and Coronary Angiography;  Surgeon: Minna Merritts, MD;  Location: [facility];  Service: Cardiovascular;  Laterality: N/A;  . CARDIAC CATHETERIZATION Left 06/04/2015   Procedure: Left Heart Cath and Coronary Angiography;  Surgeon: Wellington Hampshire, MD;  Location: [facility];  Service: Cardiovascular;  Laterality: Left;  . CARDIAC CATHETERIZATION N/A 06/04/2015   Procedure: Coronary Stent Intervention;  Surgeon: Wellington Hampshire, MD;  Location: [facility];  Service: Cardiovascular;  Laterality: N/A;  . DILATION AND CURETTAGE OF UTERUS    . ESOPHAGOGASTRODUODENOSCOPY (EGD) WITH PROPOFOL N/A 09/14/2014   Procedure: ESOPHAGOGASTRODUODENOSCOPY (EGD) WITH PROPOFOL;  Surgeon: Josefine Class, MD;  Location: [facility] ENDOSCOPY;  Service: Endoscopy;  Laterality: N/A;  . GASTRIC BYPASS  09/2009   [facility]   . Left Carotid to sublcavian artery bypass w/ subclavian artery ligation     a. Performed @ [facility].  . MELANOMA EXCISION  2016   Dr. Evorn Gong  . RIGHT OOPHORECTOMY    . TRIGGER FINGER RELEASE    . TUBAL LIGATION     R tube removed still w/ Left    Family History  Problem Relation Age of Onset  . Hypertension Mother   . Anxiety disorder Mother   . Depression Mother   . Bipolar disorder Mother   . Heart disease Mother   . Hyperlipidemia Mother   . Kidney disease Father   . Heart disease Father   . Hypertension Father   . Diabetes Father   . Stroke Father   . Colon cancer Father     dx in his 20's  . Anxiety disorder Father   . Depression Father   . Skin cancer Father   . Kidney disease Sister   . Thyroid nodules Sister   . Hypertension Sister   . Hypertension Sister   . Diabetes Sister   . Hyperlipidemia Sister   . Depression Sister   . Breast cancer Maternal Aunt   . Breast cancer Maternal Aunt    Social History:  reports that she quit smoking about 21 years ago. Her smoking use included Cigarettes. She has never used smokeless tobacco. She reports that she does not drink alcohol or use drugs.  Allergies: No Known Allergies  Medications Prior to Admission  Medication Sig Dispense Refill  . aspirin EC 81 MG tablet Take 1 tablet (81 mg total) by mouth daily.    . baclofen (LIORESAL) 10 MG tablet Take 1 tablet (10 mg total) by mouth 2 (two) times daily as needed for muscle spasms.  0  . citalopram (CELEXA) 40 MG tablet Take 1 tablet (40 mg total) by mouth daily. 30 tablet 5  . clopidogrel (PLAVIX) 75 MG tablet Take 75 mg by mouth daily.    Marland Kitchen diltiazem (CARDIZEM SR) 120 MG 12 hr capsule Take 120 mg by mouth daily.    . eszopiclone (LUNESTA) 2 MG TABS tablet Take 1 tablet (2 mg total) by mouth at bedtime as needed for sleep. Take immediately before bedtime 30 tablet 0  . furosemide (LASIX) 20 MG tablet Take 20 mg by mouth daily.    Marland Kitchen gabapentin  (NEURONTIN) 300 MG capsule Take 300 mg by mouth 3 (three) times daily.    . isosorbide mononitrate (IMDUR) 60 MG 24 hr tablet Take 60 mg by mouth daily.    Marland Kitchen levothyroxine (SYNTHROID, LEVOTHROID) 25 MCG tablet Take 1 tablet (25 mcg total) by mouth daily before breakfast. 30 tablet 5  . losartan (COZAAR) 25 MG tablet Take 25 mg by mouth daily.    . metFORMIN (GLUCOPHAGE) 1000 MG tablet Take 1,000 mg by mouth 2 (two) times daily with a meal.     . nitroGLYCERIN (NITROSTAT) 0.4 MG SL tablet Place 1 tablet (0.4 mg total) under  the tongue every 5 (five) minutes as needed for chest pain. 25 tablet 6  . pantoprazole (PROTONIX) 40 MG tablet Take 1 tablet (40 mg total) by mouth 2 (two) times daily. 60 tablet 5  . promethazine (PHENERGAN) 25 MG tablet Take 1 tablet (25 mg total) by mouth every 6 (six) hours as needed for nausea or vomiting. Reported on 05/31/2015 30 tablet 3  . rosuvastatin (CRESTOR) 40 MG tablet Take 1 tablet (40 mg total) by mouth daily at 6 PM. 90 tablet 3  . traZODone (DESYREL) 100 MG tablet Take 2 tablets (200 mg total) by mouth at bedtime. 30 tablet 6    Results for orders placed or performed during the hospital encounter of 12/13/15 (from the past 48 hour(s))  CBC     Status: Abnormal   Collection Time: 12/13/15 10:03 PM  Result Value Ref Range   WBC 14.7 (H) 3.6 - 11.0 K/uL   RBC 4.49 3.80 - 5.20 MIL/uL   Hemoglobin 11.8 (L) 12.0 - 16.0 g/dL   HCT 35.9 35.0 - 47.0 %   MCV 79.8 (L) 80.0 - 100.0 fL   MCH 26.4 26.0 - 34.0 pg   MCHC 33.0 32.0 - 36.0 g/dL   RDW 14.8 (H) 11.5 - 14.5 %   Platelets 221 150 - 440 K/uL  Comprehensive metabolic panel     Status: Abnormal   Collection Time: 12/13/15 10:03 PM  Result Value Ref Range   Sodium 137 135 - 145 mmol/L   Potassium 2.9 (L) 3.5 - 5.1 mmol/L   Chloride 102 101 - 111 mmol/L   CO2 25 22 - 32 mmol/L   Glucose, Bld 122 (H) 65 - 99 mg/dL   BUN 24 (H) 6 - 20 mg/dL   Creatinine, Ser 1.94 (H) 0.44 - 1.00 mg/dL   Calcium 8.4 (L) 8.9  - 10.3 mg/dL   Total Protein 6.9 6.5 - 8.1 g/dL   Albumin 3.7 3.5 - 5.0 g/dL   AST 21 15 - 41 U/L   ALT 16 14 - 54 U/L   Alkaline Phosphatase 73 38 - 126 U/L   Total Bilirubin 0.4 0.3 - 1.2 mg/dL   GFR calc non Af Amer 30 (L) >60 mL/min   GFR calc Af Amer 34 (L) >60 mL/min    Comment: (NOTE) The eGFR has been calculated using the CKD EPI equation. This calculation has not been validated in all clinical situations. eGFR's persistently <60 mL/min signify possible Chronic Kidney Disease.    Anion gap 10 5 - 15  Troponin I     Status: None   Collection Time: 12/13/15 10:03 PM  Result Value Ref Range   Troponin I <0.03 <0.03 ng/mL  Troponin I     Status: None   Collection Time: 12/14/15  4:15 AM  Result Value Ref Range   Troponin I <0.03 <0.03 ng/mL  TSH     Status: None   Collection Time: 12/14/15  4:15 AM  Result Value Ref Range   TSH 3.168 0.350 - 4.500 uIU/mL    Comment: Performed by a 3rd Generation assay with a functional sensitivity of <=0.01 uIU/mL.   Dg Chest 2 View  Result Date: 12/13/2015 CLINICAL DATA:  Midsternal chest pain tonight radiating to the left arm. Nausea. EXAM: CHEST  2 VIEW COMPARISON:  10/28/2015 FINDINGS: Normal heart size and pulmonary vascularity. No focal airspace disease or consolidation in the lungs. No blunting of costophrenic angles. No pneumothorax. Mediastinal contours appear intact. Surgical clips in the base  of the neck. IMPRESSION: No active cardiopulmonary disease. Electronically Signed   By: Lucienne Capers M.D.   On: 12/13/2015 22:17    Review of Systems  Constitutional: Negative for chills and fever.  HENT: Negative for sore throat and tinnitus.   Eyes: Negative for blurred vision and redness.  Respiratory: Negative for cough and shortness of breath.   Cardiovascular: Positive for chest pain. Negative for palpitations, orthopnea and PND.  Gastrointestinal: Positive for nausea. Negative for abdominal pain, diarrhea and vomiting.   Genitourinary: Negative for dysuria, frequency and urgency.  Musculoskeletal: Negative for joint pain and myalgias.  Skin: Negative for rash.       No lesions  Neurological: Positive for dizziness. Negative for speech change, focal weakness and weakness.  Endo/Heme/Allergies: Does not bruise/bleed easily.       No temperature intolerance  Psychiatric/Behavioral: Negative for depression and suicidal ideas.    Blood pressure (!) 191/77, pulse 70, temperature 98.5 F (36.9 C), temperature source Oral, resp. rate 16, height '5\' 3"'  (1.6 m), weight 77.3 kg (170 lb 6.4 oz), SpO2 96 %. Physical Exam  Vitals reviewed. Constitutional: She is oriented to person, place, and time. She appears well-developed and well-nourished. No distress.  HENT:  Head: Normocephalic and atraumatic.  Mouth/Throat: Oropharynx is clear and moist.  Eyes: Conjunctivae and EOM are normal. Pupils are equal, round, and reactive to light. No scleral icterus.  Neck: Normal range of motion. Neck supple. No JVD present. No tracheal deviation present. No thyromegaly present.  Cardiovascular: Normal rate, regular rhythm and normal heart sounds.  Exam reveals no gallop and no friction rub.   No murmur heard. Respiratory: Effort normal and breath sounds normal.  GI: Soft. Bowel sounds are normal. She exhibits no distension. There is no tenderness.  Genitourinary:  Genitourinary Comments: Deferred  Musculoskeletal: Normal range of motion. She exhibits no edema.  Lymphadenopathy:    She has no cervical adenopathy.  Neurological: She is alert and oriented to person, place, and time. No cranial nerve deficit. She exhibits normal muscle tone.  Skin: Skin is warm and dry. No rash noted. No erythema.  Psychiatric: She has a normal mood and affect. Her behavior is normal. Judgment and thought content normal.     Assessment/Plan This is a 45 year old female with coronary artery disease admitted for chest pain. 1. Chest pain:  Exertional; consistent with angina. The patient has an extensive history of coronary artery disease and last had a left heart catheterization in May 2017. She also had a history of systolic congestive heart failure which apparently has improved following PCI. Initial troponin is reassuring. EKG shows no signs of new ischemia. Follow enzymes and monitor telemetry. Cardiology has been consulted. Continue Plavix and aspirin. Nitroglycerin as needed. 2. Hypertensive urgency: Appears to be etiology of chest pain. As the patient's blood pressure improved so did her symptoms. Continue losartan, diltiazem and Imdur. Labetalol as needed. 3. Diabetes mellitus type 2: Hold metformin for now as patient may undergo procedures requiring contrast. Sliding scale insulin will hospitalized. 4. Chronic kidney disease: Stage III; avoid nephrotoxic agents. Hydrate with intravenous saline. 5. Hypothyroidism: Check TSH. Continue Synthroid 6. Hyperlipidemia: Continue statin therapy 7. Depression: Continue Celexa. Trazodone and Ambien as needed for sleep 8. DVT prophylaxis: Heparin 9. GI prophylaxis: Pantoprazole per home regimen The patient is a full code. Time spent on admission orders and patient care proximally 45 minutes   Harrie Foreman, MD 12/14/2015, 7:02 AM

## 2015-12-14 NOTE — ED Notes (Signed)
Admitting MD at the bedside for pt evaluation.  

## 2015-12-14 NOTE — Plan of Care (Signed)
Problem: Pain Managment: Goal: General experience of comfort will improve Outcome: Progressing Prn medications  Problem: Cardiac: Goal: Ability to achieve and maintain adequate cardiovascular perfusion will improve Outcome: Progressing No further complaints of chest pain

## 2015-12-14 NOTE — ED Notes (Signed)
Pt assisted to bedside commode. Ambulated with steady gait. Provided for comfort and safety and will continue to assess. No increased work of breathing noted and pt had no complaints of worsening pain upon exertion.

## 2015-12-14 NOTE — Consult Note (Signed)
Cardiology Consultation Note  Patient ID: [name], MRN: [id number], DOB/AGE: 47-Nov-1972 47 y.o. Admit date: 12/13/2015   Date of Consult: 12/14/2015 Primary Physician: [name], MD Primary Cardiologist: Dr. [name], MD Requesting Physician: Dr. [name], MD  Chief Complaint: Felt hot and dizzy while cooking large quantities of stew Reason for Consult: Atypical chest pain  HPI: 45 y.o. female with h/o CAD s/p prior LAD and D1 stenting, chronic systolci CHF/ICM, hypertensive heart disease, HLD, CKD stage III-IV, medication noncompliance, prior stoke, and aortic arch aneurysm who was brought to [facility] with accelerated HTN and atypical chest pain.   Patient has been in her USOH and was cooking large quantities of stew and eating BBQ on 11/10 when she felt overheated and dizzy. She noted a brief episode of chest pain at rest. She notified the fireman she was cooking with who called EMS and checked her BP which was found to be elevated, reading unknown. She was brought to [facility].   Upon the patient's arrival to [facility] they were found to have negative cardiac enzymes, potassium 2.9 currently being repleted, SCr 1.94 (baseline 1.2), BUN 24, WBC 11.7, hgb 12.5, and SBP in the 170's mmHg up into the 200's mmHg, currently in the 160's mmHg. She had missed at least the past 24 hours of all her medications ECG non-acute as below, CXR showed no active cardiopulmonary disease. She has not had any further chest pain and wants to go home.    Past Medical History:  Diagnosis Date  . Anxiety   . Aortic arch aneurysm ([place])   . CAD (coronary artery disease)    a. cath 08/2003 nonobs dzs;  b. 11/2014: mLAD 85-90% s/p PCI/DES (Xience 3.25 x 23); c. cath 04/2015: 70% mD1 @ bifurcation. Patent LAD stent - > Med Rx.; d. Cath-PCI 5/17: Patent LAD stent, D1 now ~80% - PCI Resolute DES 2.25 x 12.    . Chronic systolic CHF (congestive heart failure) ([place])    a. echo 01/29/2013 EF 40-45%, basal anteroseptal & mid  anteroseptal segments abnl; b echo 05/2012 EF 50-55%, mildly dilated LA, nl RVSP; c. 10/2015 Echo: EF 55-60%, mild conc LVH, no rwma.  . CKD (chronic kidney disease), stage IV ([place])   . Colon polyp   . CVA (cerebral vascular accident) ([place])    a. 06/2008-TEE nl LV fxn; b. 01/2013 - notes indicate short run of SVT at that time  . Depression   . Diabetes mellitus without complication ([place])   . Gastric ulcer 04/27/2011  . GERD (gastroesophageal reflux disease)   . Headache    Migrains  . Hyperlipidemia   . Hypertension   . Hypertensive heart disease   . Hypothyroidism   . Ischemic cardiomyopathy    a. EF prev 40-45% in 2014-->Improved to 55-60% (10/2015).  . Malignant melanoma of skin of scalp ([place])   . Morbid obesity ([place])    a. s/p gastric bypass.  . Multiple sclerosis ([place]) 2015  . Orthostatic hypotension   . Syncope       Most Recent Cardiac Studies: LHC 06/04/2015: Conclusion    Prox LAD lesion, 30% stenosed.  Dist LAD lesion, 50% stenosed.  Prox Cx lesion, 40% stenosed.  Prox LAD to Mid LAD lesion, 5% stenosed. The lesion was previously treated with a drug-eluting stent six to twelve months ago.  The left ventricular systolic function is normal.  1st Diag lesion, 80% stenosed. Post intervention, there is a 0% residual stenosis.   1. Significant one-vessel coronary  artery disease with widely patent LAD stent. Significant stenosis affecting the first diagonal. 2. Normal LV systolic function and normal LVEDP. 3. Successful angioplasty and drug-eluting stent placement to the first diagonal  Recommendations: Dual antiplatelet therapy for at least 6 months and preferably 12 months. Aggressive treatment of risk factors. I am not certain if this stenosis accounts for all her symptoms. She might have endothelial dysfunction.     Surgical History:  Past Surgical History:  Procedure Laterality Date  . APPENDECTOMY    . CARDIAC CATHETERIZATION N/A 11/09/2014   Procedure:  Coronary Angiography;  Surgeon: Minna Merritts, MD;  Location: [place] [facility];  Service: Cardiovascular;  Laterality: N/A;  . CARDIAC CATHETERIZATION N/A 11/12/2014   Procedure: Coronary Stent Intervention;  Surgeon: Isaias Cowman, MD;  Location: [place] [facility];  Service: Cardiovascular;  Laterality: N/A;  . CARDIAC CATHETERIZATION N/A 04/18/2015   Procedure: Left Heart Cath and Coronary Angiography;  Surgeon: Minna Merritts, MD;  Location: [place] [facility];  Service: Cardiovascular;  Laterality: N/A;  . CARDIAC CATHETERIZATION Left 06/04/2015   Procedure: Left Heart Cath and Coronary Angiography;  Surgeon: Wellington Hampshire, MD;  Location: [place] [facility];  Service: Cardiovascular;  Laterality: Left;  . CARDIAC CATHETERIZATION N/A 06/04/2015   Procedure: Coronary Stent Intervention;  Surgeon: Wellington Hampshire, MD;  Location: [place] [facility];  Service: Cardiovascular;  Laterality: N/A;  . DILATION AND CURETTAGE OF UTERUS    . ESOPHAGOGASTRODUODENOSCOPY (EGD) WITH PROPOFOL N/A 09/14/2014   Procedure: ESOPHAGOGASTRODUODENOSCOPY (EGD) WITH PROPOFOL;  Surgeon: Josefine Class, MD;  Location: [facility] ENDOSCOPY;  Service: Endoscopy;  Laterality: N/A;  . GASTRIC BYPASS  09/2009   [facility]   . Left Carotid to sublcavian artery bypass w/ subclavian artery ligation     a. Performed @ [place].  . MELANOMA EXCISION  2016   Dr. Evorn Gong  . RIGHT OOPHORECTOMY    . TRIGGER FINGER RELEASE    . TUBAL LIGATION     R tube removed still w/ Left     Home Meds: Prior to Admission medications   Medication Sig Start Date End Date Taking? Authorizing Provider  aspirin EC 81 MG tablet Take 1 tablet (81 mg total) by mouth daily. 02/19/15  Yes Hildred Priest, MD  baclofen (LIORESAL) 10 MG tablet Take 1 tablet (10 mg total) by mouth 2 (two) times daily as needed for muscle spasms. 02/19/15  Yes Hildred Priest, MD  citalopram (CELEXA) 40 MG tablet  Take 1 tablet (40 mg total) by mouth daily. 11/15/15  Yes [name], MD  clopidogrel (PLAVIX) 75 MG tablet Take 75 mg by mouth daily.   Yes Historical Provider, MD  diltiazem (CARDIZEM SR) 120 MG 12 hr capsule Take 120 mg by mouth daily.   Yes Historical Provider, MD  eszopiclone (LUNESTA) 2 MG TABS tablet Take 1 tablet (2 mg total) by mouth at bedtime as needed for sleep. Take immediately before bedtime 08/14/15  Yes [name], MD  furosemide (LASIX) 20 MG tablet Take 20 mg by mouth daily.   Yes Historical Provider, MD  gabapentin (NEURONTIN) 300 MG capsule Take 300 mg by mouth 3 (three) times daily.   Yes Historical Provider, MD  isosorbide mononitrate (IMDUR) 60 MG 24 hr tablet Take 60 mg by mouth daily.   Yes Historical Provider, MD  levothyroxine (SYNTHROID, LEVOTHROID) 25 MCG tablet Take 1 tablet (25 mcg total) by mouth daily before breakfast. 04/18/15  Yes Gladstone Lighter,  MD  losartan (COZAAR) 25 MG tablet Take 25 mg by mouth daily. 05/05/15  Yes Historical Provider, MD  metFORMIN (GLUCOPHAGE) 1000 MG tablet Take 1,000 mg by mouth 2 (two) times daily with a meal.  05/09/15  Yes Historical Provider, MD  nitroGLYCERIN (NITROSTAT) 0.4 MG SL tablet Place 1 tablet (0.4 mg total) under the tongue every 5 (five) minutes as needed for chest pain. 04/30/15  Yes Minna Merritts, MD  pantoprazole (PROTONIX) 40 MG tablet Take 1 tablet (40 mg total) by mouth 2 (two) times daily. 04/18/15  Yes Gladstone Lighter, MD  promethazine (PHENERGAN) 25 MG tablet Take 1 tablet (25 mg total) by mouth every 6 (six) hours as needed for nausea or vomiting. Reported on 05/31/2015 11/15/15  Yes [name], MD  rosuvastatin (CRESTOR) 40 MG tablet Take 1 tablet (40 mg total) by mouth daily at 6 PM. 04/30/15  Yes Minna Merritts, MD  losartan (COZAAR) 50 MG tablet Take 1 tablet (50 mg total) by mouth daily. 12/14/15   Bettey Costa, MD  traZODone (DESYREL) 100 MG tablet Take 2 tablets (200 mg total) by mouth at  bedtime. 12/02/15   [name], MD    Inpatient Medications:  . aspirin EC  81 mg Oral Daily  . citalopram  40 mg Oral Daily  . clopidogrel  75 mg Oral Daily  . diltiazem  120 mg Oral Daily  . docusate sodium  100 mg Oral BID  . furosemide  20 mg Oral Daily  . gabapentin  300 mg Oral TID  . heparin  5,000 Units Subcutaneous Q8H  . insulin aspart  0-5 Units Subcutaneous QHS  . insulin aspart  0-9 Units Subcutaneous TID WC  . isosorbide mononitrate  60 mg Oral Daily  . labetalol  5 mg Intravenous Once  . levothyroxine  25 mcg Oral QAC breakfast  . losartan  50 mg Oral Daily  . nitroGLYCERIN  0.5 inch Topical Q6H  . pantoprazole  40 mg Oral BID AC  . potassium chloride  40 mEq Oral Once  . rosuvastatin  40 mg Oral q1800  . sodium chloride flush  3 mL Intravenous Q12H  . traZODone  200 mg Oral QHS   . 0.9 % NaCl with KCl 40 mEq / L 125 mL/hr (12/14/15 0324)    Allergies: No Known Allergies  Social History   Social History  . Marital status: Married    Spouse name: N/A  . Number of children: 1  . Years of education: N/A   Occupational History  . 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 Disabled through 08/02/2013 per forms 02/03/2013   Social History Main Topics  . Smoking status: Former Smoker    Types: Cigarettes    Quit date: 08/31/1994  . Smokeless tobacco: Never Used     Comment: quit 28 years ago  . Alcohol use No  . Drug use: No  . Sexual activity: Not Currently    Birth control/ protection: None   Other Topics Concern  . 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     Family History  Problem Relation Age of Onset  . Hypertension Mother   . Anxiety disorder Mother   . Depression Mother   . Bipolar disorder Mother   . Heart disease Mother   .  Hyperlipidemia Mother   . Kidney disease  Father   . Heart disease Father   . Hypertension Father   . Diabetes Father   . Stroke Father   . Colon cancer Father     dx in his 38's  . Anxiety disorder Father   . Depression Father   . Skin cancer Father   . Kidney disease Sister   . Thyroid nodules Sister   . Hypertension Sister   . Hypertension Sister   . Diabetes Sister   . Hyperlipidemia Sister   . Depression Sister   . Breast cancer Maternal Aunt   . Breast cancer Maternal Aunt      Review of Systems: Review of Systems  Constitutional: Positive for malaise/fatigue. Negative for chills, diaphoresis, fever and weight loss.  HENT: Negative for congestion.   Eyes: Negative for discharge and redness.  Respiratory: Negative for cough, hemoptysis, sputum production, shortness of breath and wheezing.   Cardiovascular: Positive for chest pain. Negative for palpitations, orthopnea, claudication, leg swelling and PND.  Gastrointestinal: Positive for diarrhea. Negative for abdominal pain, blood in stool, heartburn, melena, nausea and vomiting.  Genitourinary: Negative for hematuria.  Musculoskeletal: Positive for joint pain. Negative for falls and myalgias.  Skin: Negative for rash.  Neurological: Positive for dizziness. Negative for tingling, tremors, sensory change, speech change, focal weakness, loss of consciousness and weakness.  Endo/Heme/Allergies: Does not bruise/bleed easily.  Psychiatric/Behavioral: Negative for substance abuse. The patient is nervous/anxious.   All other systems reviewed and are negative.   Labs:  Recent Labs  12/13/15 2203 12/14/15 0415  TROPONINI <0.03 <0.03   Lab Results  Component Value Date   WBC 14.7 (H) 12/13/2015   HGB 11.8 (L) 12/13/2015   HCT 35.9 12/13/2015   MCV 79.8 (L) 12/13/2015   PLT 221 12/13/2015     Recent Labs Lab 12/13/15 2203  NA 137  K 2.9*  CL 102  CO2 25  BUN 24*  CREATININE 1.94*  CALCIUM 8.4*  PROT 6.9  BILITOT 0.4  ALKPHOS 73  ALT 16  AST 21    GLUCOSE 122*   Lab Results  Component Value Date   CHOL 164 11/09/2014   HDL 27 (L) 11/09/2014   LDLCALC 85 11/09/2014   TRIG 259 (H) 11/09/2014   No results found for: DDIMER  Radiology/Studies:  Dg Chest 2 View  Result Date: 12/13/2015 CLINICAL DATA:  Midsternal chest pain tonight radiating to the left arm. Nausea. EXAM: CHEST  2 VIEW COMPARISON:  10/28/2015 FINDINGS: Normal heart size and pulmonary vascularity. No focal airspace disease or consolidation in the lungs. No blunting of costophrenic angles. No pneumothorax. Mediastinal contours appear intact. Surgical clips in the base of the neck. IMPRESSION: No active cardiopulmonary disease. Electronically Signed   By: Lucienne Capers M.D.   On: 12/13/2015 22:17   US Renal  Result Date: 11/29/2015 CLINICAL DATA:  Hydronephrosis.  UTI. EXAM: RENAL / URINARY TRACT ULTRASOUND COMPLETE COMPARISON:  CT 10/31/2015. FINDINGS: Right Kidney: Length: 9.9 cm. Echogenicity within normal limits. No mass. Minimal right mid renal pelvic fullness. No prominent hydronephrosis. Left Kidney: Length: 10.3 cm. Echogenicity within normal limits. No mass. No hydronephrosis visualized on today's exam. Bladder: Small amount of debris noted within the bladder consistent patient's history of UTI. IMPRESSION: 1. Minimal right renal pelvic fullness. No prominent hydronephrosis. No hydronephrosis noted of the left kidney on today's exam. 2. Small amount of debris noted the bladder consistent patient's history of UTI . Electronically Signed   By:  [place]   On: 11/29/2015 07:26    EKG: Interpreted by me showed: NSR, 90 bpm, lateral TWI Telemetry: Interpreted by me showed: NSR, 90's bpm  Weights: Filed Weights   12/13/15 2209 12/14/15 0307  Weight: 169 lb (76.7 kg) 170 lb 6.4 oz (77.3 kg)     Physical Exam: Blood pressure (!) 165/87, pulse 65, temperature 98 F (36.7 C), temperature source Oral, resp. rate 16, height '5\' 3"'  (1.6 m), weight 170 lb 6.4  oz (77.3 kg), SpO2 98 %. Body mass index is 30.19 kg/m. General: Well developed, well nourished, in no acute distress. Head: Normocephalic, atraumatic, sclera non-icteric, no xanthomas, nares are without discharge.  Neck: Negative for carotid bruits. JVD not elevated. Lungs: Clear bilaterally to auscultation without wheezes, rales, or rhonchi. Breathing is unlabored. Heart: Tachycardic with S1 S2. No murmurs, rubs, or gallops appreciated. Abdomen: Soft, non-tender, non-distended with normoactive bowel sounds. No hepatomegaly. No rebound/guarding. No obvious abdominal masses. Msk:  Strength and tone appear normal for age. Extremities: No clubbing or cyanosis. No edema. Distal pedal pulses are 2+ and equal bilaterally. Neuro: Alert and oriented X 3. No facial asymmetry. No focal deficit. Moves all extremities spontaneously. Psych:  Responds to questions appropriately with a normal affect.    Assessment and Plan:  Principal Problem:   Accelerated hypertension Active Problems:   Hypokalemia   H/O medication noncompliance   Acute kidney injury superimposed on CKD (HCC)   Chronic kidney disease (CKD), stage III (moderate)   Atypical chest pain   CAD (coronary artery disease)    1. 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 Accelerated hypertension: -Has missed at least 24 hours of all medications, possibly longer -Restart home medications -IM increased losartan to 50 mg daily -Would hold Lasix given acute on CKD  3. Hypokalemia: -Status post IV potassium -Give an additional KCl 40 meq this morning -Recheck potassium around noon -Likely in the setting of recent diarrhea and Lasix  at home without repletion   4. CAD as above: -No symptoms concerning for angina -Continue home medications including DAPT -As above  5. Acute on CKD stage II-III: -Likely in the setting of diarrhea with volume loss -IV fluids as above -Would not take lasix scheduled any longer at this time and start taking Lasix prn at time of discharge  6. Long standing shoulder pain: -Wants to have surgery given she has met her deductible  -Recent PCI as above just 6 months prior -Would ideally like 12 months of uninterrupted DAPT -Primary cardiologist to d/w interventional cardiology    Signed, Christell Faith, PA-C [place] Pager: [phone] 12/14/2015, 9:45 AM

## 2015-12-14 NOTE — Progress Notes (Signed)
Pt mag 1.1, needs to be replaced, explained this to pt.  Pt is stating she "does not want to wait another 2 hours for the medication to infuse, states she has MG at home she can take." Page out to Dr. Benjie Karvonen, Dr. Benjie Karvonen updated to call pt and call this RN back.

## 2015-12-14 NOTE — Discharge Summary (Signed)
[facility] - [place] at [facility]   PATIENT NAME: [name]    MR#:  [id number]  DATE OF BIRTH:  1970/02/11  DATE OF ADMISSION:  12/13/2015 ADMITTING PHYSICIAN: Arnaldo Natal, MD  DATE OF DISCHARGE: 12/14/2015  PRIMARY CARE PHYSICIAN: Mila Merry, MD    ADMISSION DIAGNOSIS:  Chest pain, unspecified type [R07.9]  DISCHARGE DIAGNOSIS:  Principal Problem:   Accelerated hypertension Active Problems:   Chronic kidney disease (CKD), stage III (moderate)   Atypical chest pain   CAD (coronary artery disease)   Hypokalemia   H/O medication noncompliance   Acute kidney injury superimposed on CKD (HCC)   SECONDARY DIAGNOSIS:   Past Medical History:  Diagnosis Date  . Anxiety   . Aortic arch aneurysm (HCC)   . CAD (coronary artery disease)    a. cath 08/2003 nonobs dzs;  b. 11/2014: mLAD 85-90% s/p PCI/DES (Xience 3.25 x 23); c. cath 04/2015: 70% mD1 @ bifurcation. Patent LAD stent - > Med Rx.; d. Cath-PCI 5/17: Patent LAD stent, D1 now ~80% - PCI Resolute DES 2.25 x 12.    . Chronic systolic CHF (congestive heart failure) (HCC)    a. echo 01/29/2013 EF 40-45%, basal anteroseptal & mid anteroseptal segments abnl; b echo 05/2012 EF 50-55%, mildly dilated LA, nl RVSP; c. 10/2015 Echo: EF 55-60%, mild conc LVH, no rwma.  . CKD (chronic kidney disease), stage IV (HCC)   . Colon polyp   . CVA (cerebral vascular accident) (HCC)    a. 06/2008-TEE nl LV fxn; b. 01/2013 - notes indicate short run of SVT at that time  . Depression   . Diabetes mellitus without complication (HCC)   . Gastric ulcer 04/27/2011  . GERD (gastroesophageal reflux disease)   . Headache    Migrains  . Hyperlipidemia   . Hypertension   . Hypertensive heart disease   . Hypothyroidism   . Ischemic cardiomyopathy    a. EF prev 40-45% in 2014-->Improved to 55-60% (10/2015).  . Malignant melanoma of skin of scalp (HCC)   . Morbid obesity (HCC)    a. s/p gastric bypass.  . Multiple  sclerosis (HCC) 2015  . Orthostatic hypotension   . Syncope     HOSPITAL COURSE:   45 y.o. female with h/o CAD s/p prior LAD and D1 stenting, chronic systolci CHF/ICM, hypertensive heart disease, HLD, CKD stage III-IV, medication noncompliance, prior stoke, and aortic arch aneurysm who was brought to [facility] with accelerated HTN and atypical chest pain.    1. Atypical chest pain in the setting of severe hypokalemia Hypertension: Patient has ruled out for ACS. No plans for ischemic evaluation as per cardiology recommendations. 2. Accelerated hypertension: Patient missed her medications. Her home medications have been restarted. Losartan has been increased to 50 mg daily. She will need outpatient follow-up for her blood pressure.  3. Acute on chronic kidney disease stage II: This is likely in the setting of diarrhea with volume loss. Patient's Lasix has been discontinued.  4. Coronary artery disease: Patient continue outpatient medications. 5. Diabetes: Patient will continue metformin and diabetic diet  6. Hyperlipidemia: Patient continue statin therapy  7. Hypothyroid: Continue Synthroid  8. Depression and anxiety: Patient will continue Celexa  DISCHARGE CONDITIONS AND DIET:  Stable Heart healthy diet  CONSULTS OBTAINED:  Treatment Team:  Antonieta Iba, MD  DRUG ALLERGIES:  No Known Allergies  DISCHARGE MEDICATIONS:   Current Discharge Medication List    CONTINUE these medications which have CHANGED  Details  losartan (COZAAR) 50 MG tablet Take 1 tablet (50 mg total) by mouth daily. Qty: 30 tablet, Refills: 0      CONTINUE these medications which have NOT CHANGED   Details  aspirin EC 81 MG tablet Take 1 tablet (81 mg total) by mouth daily.    baclofen (LIORESAL) 10 MG tablet Take 1 tablet (10 mg total) by mouth 2 (two) times daily as needed for muscle spasms. Refills: 0    citalopram (CELEXA) 40 MG tablet Take 1 tablet (40 mg total) by mouth daily. Qty: 30  tablet, Refills: 5    clopidogrel (PLAVIX) 75 MG tablet Take 75 mg by mouth daily.    diltiazem (CARDIZEM SR) 120 MG 12 hr capsule Take 120 mg by mouth daily.   Associated Diagnoses: Iron deficiency anemia    eszopiclone (LUNESTA) 2 MG TABS tablet Take 1 tablet (2 mg total) by mouth at bedtime as needed for sleep. Take immediately before bedtime Qty: 30 tablet, Refills: 0   Associated Diagnoses: Insomnia    gabapentin (NEURONTIN) 300 MG capsule Take 300 mg by mouth 3 (three) times daily.    isosorbide mononitrate (IMDUR) 60 MG 24 hr tablet Take 60 mg by mouth daily.   Associated Diagnoses: Iron deficiency anemia    levothyroxine (SYNTHROID, LEVOTHROID) 25 MCG tablet Take 1 tablet (25 mcg total) by mouth daily before breakfast. Qty: 30 tablet, Refills: 5    metFORMIN (GLUCOPHAGE) 1000 MG tablet Take 1,000 mg by mouth 2 (two) times daily with a meal.     nitroGLYCERIN (NITROSTAT) 0.4 MG SL tablet Place 1 tablet (0.4 mg total) under the tongue every 5 (five) minutes as needed for chest pain. Qty: 25 tablet, Refills: 6    pantoprazole (PROTONIX) 40 MG tablet Take 1 tablet (40 mg total) by mouth 2 (two) times daily. Qty: 60 tablet, Refills: 5    promethazine (PHENERGAN) 25 MG tablet Take 1 tablet (25 mg total) by mouth every 6 (six) hours as needed for nausea or vomiting. Reported on 05/31/2015 Qty: 30 tablet, Refills: 3   Associated Diagnoses: Nausea and vomiting, intractability of vomiting not specified, unspecified vomiting type    rosuvastatin (CRESTOR) 40 MG tablet Take 1 tablet (40 mg total) by mouth daily at 6 PM. Qty: 90 tablet, Refills: 3    traZODone (DESYREL) 100 MG tablet Take 2 tablets (200 mg total) by mouth at bedtime. Qty: 30 tablet, Refills: 6      STOP taking these medications     furosemide (LASIX) 20 MG tablet               Today   CHIEF COMPLAINT:   Patient ready to be discharged home. Patient is not complaining of chest pain. She has had a  headache for one week. She has not monitored her blood pressure.   VITAL SIGNS:  Blood pressure 133/68, pulse 80, temperature 98.3 F (36.8 C), temperature source Oral, resp. rate 16, height 5\' 3"  (1.6 m), weight 77.3 kg (170 lb 6.4 oz), SpO2 97 %.   REVIEW OF SYSTEMS:  Review of Systems  Constitutional: Negative.  Negative for chills, fever and malaise/fatigue.  HENT: Negative.  Negative for ear discharge, ear pain, hearing loss, nosebleeds and sore throat.   Eyes: Negative.  Negative for blurred vision and pain.  Respiratory: Negative.  Negative for cough, hemoptysis, shortness of breath and wheezing.   Cardiovascular: Negative.  Negative for chest pain, palpitations and leg swelling.  Gastrointestinal: Negative.  Negative for abdominal  pain, blood in stool, diarrhea, nausea and vomiting.  Genitourinary: Negative.  Negative for dysuria.  Musculoskeletal: Negative.  Negative for back pain.  Skin: Negative.   Neurological: Negative for dizziness, tremors, speech change, focal weakness, seizures and headaches.  Endo/Heme/Allergies: Negative.  Does not bruise/bleed easily.  Psychiatric/Behavioral: Negative.  Negative for depression, hallucinations and suicidal ideas.     PHYSICAL EXAMINATION:  GENERAL:  45 y.o.-year-old patient lying in the bed with no acute distress.  NECK:  Supple, no jugular venous distention. No thyroid enlargement, no tenderness.  LUNGS: Normal breath sounds bilaterally, no wheezing, rales,rhonchi  No use of accessory muscles of respiration.  CARDIOVASCULAR: S1, S2 normal. No murmurs, rubs, or gallops.  ABDOMEN: Soft, non-tender, non-distended. Bowel sounds present. No organomegaly or mass.  EXTREMITIES: No pedal edema, cyanosis, or clubbing.  PSYCHIATRIC: The patient is alert and oriented x 3.  SKIN: No obvious rash, lesion, or ulcer.   DATA REVIEW:   CBC  Recent Labs Lab 12/13/15 2203  WBC 14.7*  HGB 11.8*  HCT 35.9  PLT 221    Chemistries    Recent Labs Lab 12/13/15 2203  NA 137  K 2.9*  CL 102  CO2 25  GLUCOSE 122*  BUN 24*  CREATININE 1.94*  CALCIUM 8.4*  AST 21  ALT 16  ALKPHOS 73  BILITOT 0.4    Cardiac Enzymes  Recent Labs Lab 12/13/15 2203 12/14/15 0415  TROPONINI <0.03 <0.03    Microbiology Results  @[email or web address] @  RADIOLOGY:  Dg Chest 2 View  Result Date: 12/13/2015 CLINICAL DATA:  Midsternal chest pain tonight radiating to the left arm. Nausea. EXAM: CHEST  2 VIEW COMPARISON:  10/28/2015 FINDINGS: Normal heart size and pulmonary vascularity. No focal airspace disease or consolidation in the lungs. No blunting of costophrenic angles. No pneumothorax. Mediastinal contours appear intact. Surgical clips in the base of the neck. IMPRESSION: No active cardiopulmonary disease. Electronically Signed   By: Burman Nieves M.D.   On: 12/13/2015 22:17      Management plans discussed with the patient and she is in agreement. Stable for discharge home  Patient should follow up with cardiology  CODE STATUS:     Code Status Orders        Start     Ordered   12/14/15 0240  Full code  Continuous     12/14/15 0239    Code Status History    Date Active Date Inactive Code Status Order ID Comments User Context   11/07/2015  3:21 PM 11/08/2015  7:20 PM Full Code [id number]  Gracelyn Nurse, MD Inpatient   10/28/2015  4:04 PM 11/02/2015  3:04 PM Full Code [id number]  Houston Siren, MD Inpatient   06/03/2015  3:10 PM 06/05/2015  3:01 PM Full Code [id number]  Enedina Finner, MD Inpatient   04/17/2015  9:59 PM 04/18/2015  8:45 PM Full Code [id number]  Altamese Dilling, MD ED   02/15/2015  8:04 PM 02/20/2015  7:03 PM Full Code [id number]  Audery Amel, MD Inpatient   11/12/2014  8:33 AM 11/13/2014  2:49 PM Full Code [id number]  Marcina Millard, MD Inpatient   11/07/2014  9:53 PM 11/12/2014  8:33 AM Full Code [id number]  Houston Siren, MD Inpatient      TOTAL TIME TAKING CARE OF THIS PATIENT: 36 minutes.     Note: This dictation was prepared with Dragon dictation along with smaller phrase technology. Any transcriptional errors that result from this process are unintentional.  Errica Dutil M.D  on 12/14/2015 at 11:48 AM  Between 7am to 6pm - Pager - [phone] After 6pm go to [email or web address] - Social research officer, government  [facility]  Office  [phone]  CC: Primary care physician; Mila Merry, MD

## 2015-12-14 NOTE — Progress Notes (Signed)
Pt refusing to take IV magnesium

## 2015-12-14 NOTE — Progress Notes (Signed)
MEDICATION RELATED CONSULT NOTE - INITIAL   Pharmacy Consult for Electrolyte Monitoring  Indication: Hypomagnesemia  No Known Allergies  Patient Measurements: Height: 5\' 3"  (160 cm) Weight: 170 lb 6.4 oz (77.3 kg) IBW/kg (Calculated) : 52.4  Vital Signs: Temp: 98.3 F (36.8 C) (11/11 1108) Temp Source: Oral (11/11 1108) BP: 133/68 (11/11 1108) Pulse Rate: 80 (11/11 1108) Intake/Output from previous day: 11/10 0701 - 11/11 0700 In: 453 [I.V.:453] Out: 100 [Urine:100] Intake/Output from this shift: Total I/O In: 777.5 [P.O.:240; I.V.:537.5] Out: 900 [Urine:900]  Labs:  Recent Labs  12/13/15 2203 12/14/15 1144  WBC 14.7*  --   HGB 11.8*  --   HCT 35.9  --   PLT 221  --   CREATININE 1.94*  --   MG  --  1.1*  ALBUMIN 3.7  --   PROT 6.9  --   AST 21  --   ALT 16  --   ALKPHOS 73  --   BILITOT 0.4  --    Estimated Creatinine Clearance: 35.3 mL/min (by C-G formula based on SCr of 1.94 mg/dL (H)).   Microbiology: Recent Results (from the past 720 hour(s))  Urine culture     Status: None   Collection Time: 11/15/15 12:00 AM  Result Value Ref Range Status   Urine Culture, Routine Final report  Final   Urine Culture result 1 Comment  Final    Comment: Culture shows less than 10,000 colony forming units of bacteria per milliliter of urine. This colony count is not generally considered to be clinically significant.     Medical History: Past Medical History:  Diagnosis Date  . Anxiety   . Aortic arch aneurysm ([place])   . CAD (coronary artery disease)    a. cath 08/2003 nonobs dzs;  b. 11/2014: mLAD 85-90% s/p PCI/DES (Xience 3.25 x 23); c. cath 04/2015: 70% mD1 @ bifurcation. Patent LAD stent - > Med Rx.; d. Cath-PCI 5/17: Patent LAD stent, D1 now ~80% - PCI Resolute DES 2.25 x 12.    . Chronic systolic CHF (congestive heart failure) ([place])    a. echo 01/29/2013 EF 40-45%, basal anteroseptal & mid anteroseptal segments abnl; b echo 05/2012 EF 50-55%, mildly dilated  LA, nl RVSP; c. 10/2015 Echo: EF 55-60%, mild conc LVH, no rwma.  . CKD (chronic kidney disease), stage IV ([place])   . Colon polyp   . CVA (cerebral vascular accident) ([place])    a. 06/2008-TEE nl LV fxn; b. 01/2013 - notes indicate short run of SVT at that time  . Depression   . Diabetes mellitus without complication ([place])   . Gastric ulcer 04/27/2011  . GERD (gastroesophageal reflux disease)   . Headache    Migrains  . Hyperlipidemia   . Hypertension   . Hypertensive heart disease   . Hypothyroidism   . Ischemic cardiomyopathy    a. EF prev 40-45% in 2014-->Improved to 55-60% (10/2015).  . Malignant melanoma of skin of scalp ([place])   . Morbid obesity ([place])    a. s/p gastric bypass.  . Multiple sclerosis ([place]) 2015  . Orthostatic hypotension   . Syncope     Assessment: 45 y/o F with a h/o CAD, CHF, HLD, CKD, and CVA admitted for chest pain. Pharmacy has been consulted to assist in managing electrolytes.   Plan:  Magnesium sulfate 4 g iv once and f/u AM labs if patient not d/c.   Ulice Dash D 12/14/2015,1:05 PM

## 2015-12-14 NOTE — Progress Notes (Signed)
Pt discharged to home via wc.  Instructions  given to pt.  Questions answered.  No distress.  

## 2015-12-15 NOTE — Progress Notes (Signed)
[place]  [phone] [phone]  ID: [name] OB: 1970/11/29  MR#: [id number]  HER#:[id number]  Patient Care Team: [name], MD as PCP - General (Family Medicine) Dagoberto Ligas, MD as Consulting Physician (Nephrology) Minna Merritts, MD as Consulting Physician (Cardiology) Vladimir Crofts, MD as Consulting Physician (Neurology) Irene Shipper, MD as Consulting Physician (Gastroenterology)  CHIEF COMPLAINT: Iron deficiency anemia.  INTERVAL HISTORY: Patient is here today for labs and further evaluation and consideration of IV feraheme. She was admitted overnight recently for hypertensive urgency and chest pain. She currently feels well and is back to her baseline. She has no neurologic complaints. She denies any recent fevers or illnesses. She has a good appetite and denies weight loss. She denies any chest pain or shortness of breath. She denies any nausea, vomiting, constipation, or diarrhea. She has no melena or hematochezia. She has no urinary complaints. Patient offers no further specific complaints.  REVIEW OF SYSTEMS:   Review of Systems  Constitutional: Positive for malaise/fatigue. Negative for fever and weight loss.  Respiratory: Negative.  Negative for cough and shortness of breath.   Cardiovascular: Negative.  Negative for chest pain, palpitations and leg swelling.  Gastrointestinal: Negative.  Negative for blood in stool and melena.  Genitourinary: Negative.   Musculoskeletal: Negative.   Neurological: Positive for weakness. Negative for sensory change.  Psychiatric/Behavioral: Negative.  The patient is not nervous/anxious.     As per HPI. Otherwise, a complete review of systems is negative.  PAST MEDICAL HISTORY: Past Medical History:  Diagnosis Date  . Anxiety   . Aortic arch aneurysm ([place])   . CAD (coronary artery disease)    a. cath 08/2003 nonobs dzs;  b. 11/2014: mLAD 85-90% s/p PCI/DES (Xience 3.25 x 23);  c. cath 04/2015: 70% mD1 @ bifurcation. Patent LAD stent - > Med Rx.; d. Cath-PCI 5/17: Patent LAD stent, D1 now ~80% - PCI Resolute DES 2.25 x 12.    . Chronic systolic CHF (congestive heart failure) ([place])    a. echo 01/29/2013 EF 40-45%, basal anteroseptal & mid anteroseptal segments abnl; b echo 05/2012 EF 50-55%, mildly dilated LA, nl RVSP; c. 10/2015 Echo: EF 55-60%, mild conc LVH, no rwma.  . CKD (chronic kidney disease), stage IV ([place])   . Colon polyp   . CVA (cerebral vascular accident) ([place])    a. 06/2008-TEE nl LV fxn; b. 01/2013 - notes indicate short run of SVT at that time  . Depression   . Diabetes mellitus without complication ([place])   . Gastric ulcer 04/27/2011  . GERD (gastroesophageal reflux disease)   . Headache    Migrains  . Hyperlipidemia   . Hypertension   . Hypertensive heart disease   . Hypothyroidism   . Ischemic cardiomyopathy    a. EF prev 40-45% in 2014-->Improved to 55-60% (10/2015).  . Malignant melanoma of skin of scalp ([place])   . Morbid obesity ([place])    a. s/p gastric bypass.  . Multiple sclerosis ([place]) 2015  . Orthostatic hypotension   . Syncope     PAST SURGICAL HISTORY: Past Surgical History:  Procedure Laterality Date  . APPENDECTOMY    . CARDIAC CATHETERIZATION N/A 11/09/2014   Procedure: Coronary Angiography;  Surgeon: Minna Merritts, MD;  Location: [facility];  Service: Cardiovascular;  Laterality: N/A;  . CARDIAC CATHETERIZATION N/A 11/12/2014   Procedure: Coronary Stent Intervention;  Surgeon: Isaias Cowman, MD;  Location: [place] [facility];  Service: Cardiovascular;  Laterality: N/A;  . CARDIAC CATHETERIZATION N/A 04/18/2015   Procedure: Left Heart Cath and Coronary Angiography;  Surgeon: Minna Merritts, MD;  Location: [place] [facility];  Service: Cardiovascular;  Laterality: N/A;  . CARDIAC CATHETERIZATION Left 06/04/2015   Procedure: Left Heart Cath and Coronary Angiography;  Surgeon: Wellington Hampshire, MD;  Location:  [place];  Service: Cardiovascular;  Laterality: Left;  . CARDIAC CATHETERIZATION N/A 06/04/2015   Procedure: Coronary Stent Intervention;  Surgeon: Wellington Hampshire, MD;  Location: [place] [facility];  Service: Cardiovascular;  Laterality: N/A;  . DILATION AND CURETTAGE OF UTERUS    . ESOPHAGOGASTRODUODENOSCOPY (EGD) WITH PROPOFOL N/A 09/14/2014   Procedure: ESOPHAGOGASTRODUODENOSCOPY (EGD) WITH PROPOFOL;  Surgeon: Josefine Class, MD;  Location: [facility] ENDOSCOPY;  Service: Endoscopy;  Laterality: N/A;  . GASTRIC BYPASS  09/2009   [facility]   . Left Carotid to sublcavian artery bypass w/ subclavian artery ligation     a. Performed @ [employer] [facility].  . MELANOMA EXCISION  2016   Dr. Evorn Gong  . RIGHT OOPHORECTOMY    . TRIGGER FINGER RELEASE    . TUBAL LIGATION     R tube removed still w/ Left    FAMILY HISTORY Family History  Problem Relation Age of Onset  . Hypertension Mother   . Anxiety disorder Mother   . Depression Mother   . Bipolar disorder Mother   . Heart disease Mother   . Hyperlipidemia Mother   . Kidney disease Father   . Heart disease Father   . Hypertension Father   . Diabetes Father   . Stroke Father   . Colon cancer Father     dx in his 69's  . Anxiety disorder Father   . Depression Father   . Skin cancer Father   . Kidney disease Sister   . Thyroid nodules Sister   . Hypertension Sister   . Hypertension Sister   . Diabetes Sister   . Hyperlipidemia Sister   . Depression Sister   . Breast cancer Maternal Aunt   . Breast cancer Maternal Aunt        ADVANCED DIRECTIVES:    HEALTH MAINTENANCE: Social History  Substance Use Topics  . Smoking status: Former Smoker    Types: Cigarettes    Quit date: 08/31/1994  . Smokeless tobacco: Never Used     Comment: quit 28 years ago  . Alcohol use No     No Known Allergies  Current Outpatient Prescriptions  Medication Sig Dispense Refill  . aspirin EC 81 MG tablet Take 1 tablet  (81 mg total) by mouth daily.    . baclofen (LIORESAL) 10 MG tablet Take 1 tablet (10 mg total) by mouth 2 (two) times daily as needed for muscle spasms.  0  . citalopram (CELEXA) 40 MG tablet Take 1 tablet (40 mg total) by mouth daily. 30 tablet 5  . clopidogrel (PLAVIX) 75 MG tablet Take 75 mg by mouth daily.    Marland Kitchen diltiazem (CARDIZEM SR) 120 MG 12 hr capsule Take 120 mg by mouth daily.    . eszopiclone (LUNESTA) 2 MG TABS tablet Take 1 tablet (2 mg total) by mouth at bedtime as needed for sleep. Take immediately before bedtime 30 tablet 0  . gabapentin (NEURONTIN) 300 MG capsule Take 300 mg by mouth 3 (three) times daily.    . isosorbide mononitrate (IMDUR) 60 MG 24 hr tablet Take 60 mg by mouth daily.    Marland Kitchen  levothyroxine (SYNTHROID, LEVOTHROID) 25 MCG tablet Take 1 tablet (25 mcg total) by mouth daily before breakfast. 30 tablet 5  . losartan (COZAAR) 50 MG tablet Take 1 tablet (50 mg total) by mouth daily. 30 tablet 0  . metFORMIN (GLUCOPHAGE) 1000 MG tablet Take 1,000 mg by mouth 2 (two) times daily with a meal.     . nitroGLYCERIN (NITROSTAT) 0.4 MG SL tablet Place 1 tablet (0.4 mg total) under the tongue every 5 (five) minutes as needed for chest pain. 25 tablet 6  . pantoprazole (PROTONIX) 40 MG tablet Take 1 tablet (40 mg total) by mouth 2 (two) times daily. 60 tablet 5  . promethazine (PHENERGAN) 25 MG tablet Take 1 tablet (25 mg total) by mouth every 6 (six) hours as needed for nausea or vomiting. Reported on 05/31/2015 30 tablet 3  . rosuvastatin (CRESTOR) 40 MG tablet Take 1 tablet (40 mg total) by mouth daily at 6 PM. 90 tablet 3  . traZODone (DESYREL) 100 MG tablet Take 2 tablets (200 mg total) by mouth at bedtime. 30 tablet 6   No current facility-administered medications for this visit.     OBJECTIVE: Vitals:   12/16/15 1005  BP: (!) 156/104  Pulse: 75  Resp: 18  Temp: 98.1 F (36.7 C)     Body mass index is 30.17 kg/m.    ECOG FS:0 - Asymptomatic  General:  Well-developed, well-nourished, no acute distress. Eyes: Pink conjunctiva, anicteric sclera. Lungs: Clear to auscultation bilaterally. Heart: Regular rate and rhythm. No rubs, murmurs, or gallops. Abdomen: Soft, nontender, nondistended. No organomegaly noted, normoactive bowel sounds. Musculoskeletal: No edema, cyanosis, or clubbing. Neuro: Alert, answering all questions appropriately. Cranial nerves grossly intact. Skin: No rashes or petechiae noted. Psych: Normal affect.  LAB RESULTS:  Lab Results  Component Value Date   NA 137 12/13/2015   K 4.0 12/14/2015   CL 102 12/13/2015   CO2 25 12/13/2015   GLUCOSE 122 (H) 12/13/2015   BUN 24 (H) 12/13/2015   CREATININE 1.94 (H) 12/13/2015   CALCIUM 8.4 (L) 12/13/2015   PROT 6.9 12/13/2015   ALBUMIN 3.7 12/13/2015   AST 21 12/13/2015   ALT 16 12/13/2015   ALKPHOS 73 12/13/2015   BILITOT 0.4 12/13/2015   GFRNONAA 30 (L) 12/13/2015   GFRAA 34 (L) 12/13/2015    Lab Results  Component Value Date   WBC 14.7 (H) 12/13/2015   NEUTROABS 9.2 (H) 12/11/2015   HGB 11.8 (L) 12/13/2015   HCT 35.9 12/13/2015   MCV 79.8 (L) 12/13/2015   PLT 221 12/13/2015   Lab Results  Component Value Date   IRON 42 12/11/2015   TIBC 215 (L) 12/11/2015   IRONPCTSAT 20 12/11/2015    Lab Results  Component Value Date   FERRITIN 79 12/11/2015     STUDIES: Dg Chest 2 View  Result Date: 12/13/2015 CLINICAL DATA:  Midsternal chest pain tonight radiating to the left arm. Nausea. EXAM: CHEST  2 VIEW COMPARISON:  10/28/2015 FINDINGS: Normal heart size and pulmonary vascularity. No focal airspace disease or consolidation in the lungs. No blunting of costophrenic angles. No pneumothorax. Mediastinal contours appear intact. Surgical clips in the base of the neck. IMPRESSION: No active cardiopulmonary disease. Electronically Signed   By: Lucienne Capers M.D.   On: 12/13/2015 22:17   US Renal  Result Date: 11/29/2015 CLINICAL DATA:  Hydronephrosis.  UTI.  EXAM: RENAL / URINARY TRACT ULTRASOUND COMPLETE COMPARISON:  CT 10/31/2015. FINDINGS: Right Kidney: Length: 9.9 cm. Echogenicity within normal  limits. No mass. Minimal right mid renal pelvic fullness. No prominent hydronephrosis. Left Kidney: Length: 10.3 cm. Echogenicity within normal limits. No mass. No hydronephrosis visualized on today's exam. Bladder: Small amount of debris noted within the bladder consistent patient's history of UTI. IMPRESSION: 1. Minimal right renal pelvic fullness. No prominent hydronephrosis. No hydronephrosis noted of the left kidney on today's exam. 2. Small amount of debris noted the bladder consistent patient's history of UTI . Electronically Signed   By: Marcello Moores  Register   On: 11/29/2015 07:26    ASSESSMENT: Iron deficiency anemia.  PLAN:    1. Iron deficiency anemia: Possibly secondary to poor absorption given patient's history of gastric bypass surgery. Patient's hemoglobin and iron stores continue to be within normal limits. Previous, her entire anemia workup was also either negative or within normal limits. Patient does not take oral iron.  No IV feraheme needed today. Patient last received IV iron in March 2017. Patient will return to clinic in 4 months with repeat laboratory work and further evaluation.  2. Hypertension: Patient's blood pressure improved from her admission this past weekend, but still elevated. Continue monitoring and treatment per primary care. 3. Chronic renal insufficiency: Patient's creatinine appears to be at her baseline. Continue monitoring treatment per nephrology.   Patient expressed understanding and was in agreement with this plan. She also understands that She can call clinic at any time with any questions, concerns, or complaints.   Lloyd Huger, MD   12/16/2015 10:44 AM

## 2015-12-16 ENCOUNTER — Inpatient Hospital Stay (HOSPITAL_BASED_OUTPATIENT_CLINIC_OR_DEPARTMENT_OTHER): Payer: BC Managed Care – PPO | Admitting: Oncology

## 2015-12-16 VITALS — BP 156/104 | HR 75 | Temp 98.1°F | Resp 18 | Wt 170.3 lb

## 2015-12-16 DIAGNOSIS — Z8669 Personal history of other diseases of the nervous system and sense organs: Secondary | ICD-10-CM

## 2015-12-16 DIAGNOSIS — Z803 Family history of malignant neoplasm of breast: Secondary | ICD-10-CM

## 2015-12-16 DIAGNOSIS — R531 Weakness: Secondary | ICD-10-CM

## 2015-12-16 DIAGNOSIS — D508 Other iron deficiency anemias: Secondary | ICD-10-CM

## 2015-12-16 DIAGNOSIS — E785 Hyperlipidemia, unspecified: Secondary | ICD-10-CM

## 2015-12-16 DIAGNOSIS — Z85820 Personal history of malignant melanoma of skin: Secondary | ICD-10-CM

## 2015-12-16 DIAGNOSIS — F329 Major depressive disorder, single episode, unspecified: Secondary | ICD-10-CM

## 2015-12-16 DIAGNOSIS — I5022 Chronic systolic (congestive) heart failure: Secondary | ICD-10-CM

## 2015-12-16 DIAGNOSIS — Z8673 Personal history of transient ischemic attack (TIA), and cerebral infarction without residual deficits: Secondary | ICD-10-CM

## 2015-12-16 DIAGNOSIS — F419 Anxiety disorder, unspecified: Secondary | ICD-10-CM | POA: Diagnosis not present

## 2015-12-16 DIAGNOSIS — Z9884 Bariatric surgery status: Secondary | ICD-10-CM

## 2015-12-16 DIAGNOSIS — K219 Gastro-esophageal reflux disease without esophagitis: Secondary | ICD-10-CM

## 2015-12-16 DIAGNOSIS — N390 Urinary tract infection, site not specified: Secondary | ICD-10-CM

## 2015-12-16 DIAGNOSIS — E039 Hypothyroidism, unspecified: Secondary | ICD-10-CM

## 2015-12-16 DIAGNOSIS — N1330 Unspecified hydronephrosis: Secondary | ICD-10-CM

## 2015-12-16 DIAGNOSIS — Z7982 Long term (current) use of aspirin: Secondary | ICD-10-CM

## 2015-12-16 DIAGNOSIS — R5383 Other fatigue: Secondary | ICD-10-CM

## 2015-12-16 DIAGNOSIS — I255 Ischemic cardiomyopathy: Secondary | ICD-10-CM

## 2015-12-16 DIAGNOSIS — I130 Hypertensive heart and chronic kidney disease with heart failure and stage 1 through stage 4 chronic kidney disease, or unspecified chronic kidney disease: Secondary | ICD-10-CM

## 2015-12-16 DIAGNOSIS — Z86010 Personal history of colonic polyps: Secondary | ICD-10-CM

## 2015-12-16 DIAGNOSIS — Z8719 Personal history of other diseases of the digestive system: Secondary | ICD-10-CM

## 2015-12-16 DIAGNOSIS — D509 Iron deficiency anemia, unspecified: Secondary | ICD-10-CM

## 2015-12-16 DIAGNOSIS — N184 Chronic kidney disease, stage 4 (severe): Secondary | ICD-10-CM

## 2015-12-16 DIAGNOSIS — G35 Multiple sclerosis: Secondary | ICD-10-CM

## 2015-12-16 DIAGNOSIS — Z7984 Long term (current) use of oral hypoglycemic drugs: Secondary | ICD-10-CM

## 2015-12-16 DIAGNOSIS — Z87891 Personal history of nicotine dependence: Secondary | ICD-10-CM

## 2015-12-16 DIAGNOSIS — Z79899 Other long term (current) drug therapy: Secondary | ICD-10-CM

## 2015-12-16 DIAGNOSIS — Z800 Family history of malignant neoplasm of digestive organs: Secondary | ICD-10-CM

## 2015-12-16 DIAGNOSIS — E119 Type 2 diabetes mellitus without complications: Secondary | ICD-10-CM

## 2015-12-16 DIAGNOSIS — I2510 Atherosclerotic heart disease of native coronary artery without angina pectoris: Secondary | ICD-10-CM

## 2015-12-16 DIAGNOSIS — I951 Orthostatic hypotension: Secondary | ICD-10-CM

## 2015-12-16 NOTE — Progress Notes (Signed)
Recently admitted to hospital for hypertension and hypomagnesium. BP meds were adjusted and pt given IV mag. Pt has follow up with cardiologist on 11/16 for further evaluation. Feeling tired today due to recent hospitalization.

## 2015-12-17 ENCOUNTER — Telehealth: Payer: Self-pay | Admitting: Cardiovascular Disease

## 2015-12-17 NOTE — Telephone Encounter (Signed)
Pt called and asks the status of her clearance for her orhtopaedic surgery for her right shoulder. Please call.

## 2015-12-17 NOTE — Telephone Encounter (Signed)
Pt was sent and read MyChart message.  She has appt w/ Dr. [name] on 11/16 to discuss further.

## 2015-12-19 ENCOUNTER — Ambulatory Visit (INDEPENDENT_AMBULATORY_CARE_PROVIDER_SITE_OTHER): Payer: BC Managed Care – PPO | Admitting: Cardiovascular Disease

## 2015-12-19 ENCOUNTER — Encounter: Payer: Self-pay | Admitting: Cardiovascular Disease

## 2015-12-19 VITALS — BP 150/78 | HR 69 | Ht 63.0 in | Wt 168.5 lb

## 2015-12-19 DIAGNOSIS — E1159 Type 2 diabetes mellitus with other circulatory complications: Secondary | ICD-10-CM | POA: Diagnosis not present

## 2015-12-19 DIAGNOSIS — IMO0002 Reserved for concepts with insufficient information to code with codable children: Secondary | ICD-10-CM

## 2015-12-19 DIAGNOSIS — N183 Chronic kidney disease, stage 3 unspecified: Secondary | ICD-10-CM

## 2015-12-19 DIAGNOSIS — E1165 Type 2 diabetes mellitus with hyperglycemia: Secondary | ICD-10-CM

## 2015-12-19 DIAGNOSIS — E7800 Pure hypercholesterolemia, unspecified: Secondary | ICD-10-CM

## 2015-12-19 DIAGNOSIS — I5022 Chronic systolic (congestive) heart failure: Secondary | ICD-10-CM

## 2015-12-19 DIAGNOSIS — I25111 Atherosclerotic heart disease of native coronary artery with angina pectoris with documented spasm: Secondary | ICD-10-CM

## 2015-12-19 DIAGNOSIS — I10 Essential (primary) hypertension: Secondary | ICD-10-CM

## 2015-12-19 MED ORDER — ISOSORBIDE MONONITRATE ER 60 MG PO TB24: 60.0000 mg | ORAL_TABLET | Freq: Every day | ORAL | 6 refills | Status: DC

## 2015-12-19 NOTE — Progress Notes (Signed)
Patient ID: [name], female    DOB: [birth year], 45 y.o.   MRN: [id number]  45 year old woman with a long hx of smoking, numerous prior strokes,  history of chest pain dating back to 2005 at that time with cardiac catheterization on August 09, 2003 found to have mild plaque in the LAD, but there was no obstructive disease noted.   MUGA, which showed an ejection fraction of 51%.   Transesophageal echocardiogram in the setting of a CVA in May 2010  showed normal LV function, s/p gastric bypass surgery, Who presents for routine followup of her coronary artery disease  Cardiac catheterization   March 2017showing moderate LAD disease, 70% diagonal disease small vessel, near the ostium,  Decision was made to treat this medically  She has continued to have worsening unstable angina symptoms Went to the emergency room for recurrent chest pain symptoms Despite optimal medical management, continued to have angina She had new EKG changes in clinic 2 weeks ago Scheduled for cardiac catheterization 06/04/2015, had stent placed to her small diagonal branch  In follow-up today she reports chest pain has resolved. Weight has been trending upwards, feels like she is retaining fluid. Numbers provided today shows weight up from 190 pounds up to 195 pounds in the past week Denies any leg edema, no PND or orthopnea She does have significant salt and fluid intake She is going to the beach this weekend  EKG on today's visit shows normal sinus rhythm with rate 65 bpm, nonspecific ST abnormality   other past medical history reviewed Recent carotid ultrasound reviewed with her showing minimal bilateral carotid disease Recent echocardiogram reviewed with her showing essentially normal ejection fraction  She is status post left carotid to subclavian artery bypass with subclavian artery ligation, done at [facility] by vascular surgery admission to [facility] from 10/5 to 11/13/14 with unstable angina and was found to  have severe mid LAD lesion at 85-90% stenosis on diagnositc cardiac cath s/p PCI/DES on staged procedure.   CT in October 2015 showing Right aortic arch with a large retro- esophageal vascular segment, persistent dorsal aortic segment) giving off aberrant left subclavian artery. This large caliber retroaortic segment measuring 18 mm compared to the ascending aorta which measures 22 mm. This large retroesophageal aortic segment along with the partially calcified atretic ductus ligament between the left aberrant subclavian artery and the left pulmonary artery creates an ALMOSTcomplete vascular ring. This is likelythe cause ofpatient's dysphagia [place].  Previously was taking midodrine or Florinef for hypotension following her stroke.  She previously presented to the hospital December 18, 2010 with chest pain.   stress test in the hospital and showed no ischemia. Cardiac enzymes were negative. No EKG changes noted.   admission to the hospital 11/01/2012 with discharge 11/02/2012 with chest pain. She ruled out with negative cardiac enzymes. Blood pressures were in the 841-660 systolic range. Discharge blood pressure medications included amlodipine 10 mg daily, benazepril 40 mg daily, clonidine 0.2 mg., Coreg 3.125 mg twice a day  stroke and admission to the hospital at the end of December 2014. She had acute onset left sided weakness including arm and leg. She reports that review of MRI showed acute stroke, as well as previous strokes. She was seen by neurology and aspirin changed to Plavix 75 mg daily. Hemoglobin A1c 8.5. Notes indicate short run of SVT while she was in the hospital and on the monitor.  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  Echocardiogram 01/29/2013 suggest ejection fraction 40-45% CT scan of the head showing small vessel disease MRI showing acute infarct right parietal white matter,  extensive chronic microvascular ischemic changes  Echocardiogram April 2014 showed ejection fraction 50-55%, otherwise normal study Stress test 11/02/2012 showing no ischemia,    No Known Allergies  Outpatient Encounter Prescriptions as of 12/19/2015  Medication Sig  . aspirin EC 81 MG tablet Take 1 tablet (81 mg total) by mouth daily.  . baclofen (LIORESAL) 10 MG tablet Take 1 tablet (10 mg total) by mouth 2 (two) times daily as needed for muscle spasms.  . citalopram (CELEXA) 40 MG tablet Take 1 tablet (40 mg total) by mouth daily.  . clopidogrel (PLAVIX) 75 MG tablet Take 75 mg by mouth daily.  Marland Kitchen diltiazem (CARDIZEM SR) 120 MG 12 hr capsule Take 120 mg by mouth daily.  . eszopiclone (LUNESTA) 2 MG TABS tablet Take 1 tablet (2 mg total) by mouth at bedtime as needed for sleep. Take immediately before bedtime  . gabapentin (NEURONTIN) 300 MG capsule Take 300 mg by mouth 3 (three) times daily.  . isosorbide mononitrate (IMDUR) 60 MG 24 hr tablet Take 60 mg by mouth daily.  Marland Kitchen levothyroxine (SYNTHROID, LEVOTHROID) 25 MCG tablet Take 1 tablet (25 mcg total) by mouth daily before breakfast.  . losartan (COZAAR) 50 MG tablet Take 1 tablet (50 mg total) by mouth daily.  . metFORMIN (GLUCOPHAGE) 1000 MG tablet Take 1,000 mg by mouth 2 (two) times daily with a meal.   . nitroGLYCERIN (NITROSTAT) 0.4 MG SL tablet Place 1 tablet (0.4 mg total) under the tongue every 5 (five) minutes as needed for chest pain.  . pantoprazole (PROTONIX) 40 MG tablet Take 1 tablet (40 mg total) by mouth 2 (two) times daily.  . promethazine (PHENERGAN) 25 MG tablet Take 1 tablet (25 mg total) by mouth every 6 (six) hours as needed for nausea or vomiting. Reported on 05/31/2015  . rosuvastatin (CRESTOR) 40 MG tablet Take 1 tablet (40 mg total) by mouth daily at 6 PM.  . traZODone (DESYREL) 100 MG tablet Take 2 tablets (200 mg total) by mouth at bedtime.   No facility-administered encounter medications on file as of  12/19/2015.     Past Medical History:  Diagnosis Date  . Anxiety   . Aortic arch aneurysm ([place])   . CAD (coronary artery disease)    a. cath 08/2003 nonobs dzs;  b. 11/2014: mLAD 85-90% s/p PCI/DES (Xience 3.25 x 23); c. cath 04/2015: 70% mD1 @ bifurcation. Patent LAD stent - > Med Rx.; d. Cath-PCI 5/17: Patent LAD stent, D1 now ~80% - PCI Resolute DES 2.25 x 12.    . Chronic systolic CHF (congestive heart failure) ([place])    a. echo 01/29/2013 EF 40-45%, basal anteroseptal & mid anteroseptal segments abnl; b echo 05/2012 EF 50-55%, mildly dilated LA, nl RVSP; c. 10/2015 Echo: EF 55-60%, mild conc LVH, no rwma.  . CKD (chronic kidney disease), stage IV ([place])   . Colon polyp   . CVA (cerebral vascular accident) ([place])    a. 06/2008-TEE nl LV fxn; b. 01/2013 - notes indicate short run of SVT at that time  . Depression   . Diabetes mellitus without complication ([place])   . Gastric ulcer 04/27/2011  . GERD (gastroesophageal reflux disease)   . Headache    Migrains  . Hyperlipidemia   . Hypertension   . Hypertensive heart disease   .  Hypothyroidism   . Ischemic cardiomyopathy    a. EF prev 40-45% in 2014-->Improved to 55-60% (10/2015).  . Malignant melanoma of skin of scalp ([place])   . Morbid obesity ([place])    a. s/p gastric bypass.  . Multiple sclerosis ([place]) 2015  . Orthostatic hypotension   . Syncope     Past Surgical History:  Procedure Laterality Date  . APPENDECTOMY    . CARDIAC CATHETERIZATION N/A 11/09/2014   Procedure: Coronary Angiography;  Surgeon: Minna Merritts, MD;  Location: [place] [facility] LAB;  Service: Cardiovascular;  Laterality: N/A;  . CARDIAC CATHETERIZATION N/A 11/12/2014   Procedure: Coronary Stent Intervention;  Surgeon: Isaias Cowman, MD;  Location: [place] [facility];  Service: Cardiovascular;  Laterality: N/A;  . CARDIAC CATHETERIZATION N/A 04/18/2015   Procedure: Left Heart Cath and Coronary Angiography;  Surgeon: Minna Merritts, MD;  Location: [place] [facility] LAB;  Service: Cardiovascular;  Laterality: N/A;  . CARDIAC CATHETERIZATION Left 06/04/2015   Procedure: Left Heart Cath and Coronary Angiography;  Surgeon: Wellington Hampshire, MD;  Location: [place] [facility] LAB;  Service: Cardiovascular;  Laterality: Left;  . CARDIAC CATHETERIZATION N/A 06/04/2015   Procedure: Coronary Stent Intervention;  Surgeon: Wellington Hampshire, MD;  Location: [place] [facility];  Service: Cardiovascular;  Laterality: N/A;  . DILATION AND CURETTAGE OF UTERUS    . ESOPHAGOGASTRODUODENOSCOPY (EGD) WITH PROPOFOL N/A 09/14/2014   Procedure: ESOPHAGOGASTRODUODENOSCOPY (EGD) WITH PROPOFOL;  Surgeon: Josefine Class, MD;  Location: [facility] ENDOSCOPY;  Service: Endoscopy;  Laterality: N/A;  . GASTRIC BYPASS  09/2009   [facility]   . Left Carotid to sublcavian artery bypass w/ subclavian artery ligation     a. Performed @ [place].  . MELANOMA EXCISION  2016   Dr. Evorn Gong  . RIGHT OOPHORECTOMY    . TRIGGER FINGER RELEASE    . TUBAL LIGATION     R tube removed still w/ Left    Social History  reports that she quit smoking about 21 years ago. Her smoking use included Cigarettes. She has never used smokeless tobacco. She reports that she does not drink alcohol or use drugs.  Family History family history includes Anxiety disorder in her father and mother; Bipolar disorder in her mother; Breast cancer in her maternal aunt and maternal aunt; Colon cancer in her father; Depression in her father, mother, and sister; Diabetes in her father and sister; Heart disease in her father and mother; Hyperlipidemia in her mother and sister; Hypertension in her father, mother, sister, and sister; Kidney disease in her father and sister; Skin cancer in her father; Stroke in her father; Thyroid nodules in her sister.   Review of Systems  Respiratory: Negative.   Cardiovascular: Negative.   Gastrointestinal: Negative.   Musculoskeletal: Negative.   Neurological:  Negative.   Hematological: Negative.   All other systems reviewed and are negative.   BP (!) 150/78 (BP Location: Left Arm, Patient Position: Sitting, Cuff Size: Normal)   Pulse 69   Ht 5\' 3"  (1.6 m)   Wt 168 lb 8 oz (76.4 kg)   LMP  (Approximate) Comment: 14 years ago  BMI 29.85 kg/m   Physical Exam  Constitutional: She is oriented to person, place, and time. She appears well-developed and well-nourished.  HENT:  Head: Normocephalic.  Nose: Nose normal.  Mouth/Throat: Oropharynx is clear and moist.  Eyes: Conjunctivae are normal. Pupils are equal, round, and reactive to light.  Neck: Normal range of motion. Neck  supple. No JVD present.  Cardiovascular: Normal rate, regular rhythm, S1 normal, S2 normal, normal heart sounds and intact distal pulses.  Exam reveals no gallop and no friction rub.   No murmur heard. Pulmonary/Chest: Effort normal and breath sounds normal. No respiratory distress. She has no wheezes. She has no rales. She exhibits no tenderness.  Abdominal: Soft. Bowel sounds are normal. She exhibits no distension. There is no tenderness.  Musculoskeletal: Normal range of motion. She exhibits no edema or tenderness.  Lymphadenopathy:    She has no cervical adenopathy.  Neurological: She is alert and oriented to person, place, and time. Coordination normal.  Skin: Skin is warm and dry. No rash noted. No erythema.  Psychiatric: She has a normal mood and affect. Her behavior is normal. Judgment and thought content normal.    Assessment and Plan  Nursing note and vitals reviewed.

## 2015-12-19 NOTE — Patient Instructions (Addendum)
Medication Instructions:   No medication changes made  Please restart isosorbide one a day  Hold plavix for 5 days before the surgery  Labwork:  No new labs needed  Testing/Procedures:  No further testing at this time   I recommend watching educational videos on topics of interest to you at:       [email or web address]  Enter code: [email or web address]    Follow-Up: It was a pleasure seeing you in the office today. Please call us if you have new issues that need to be addressed before your next appt.  [phone]  Your physician wants you to follow-up in: 6 months.  You will receive a reminder letter in the mail two months in advance. If you don't receive a letter, please call our office to schedule the follow-up appointment.  If you need a refill on your cardiac medications before your next appointment, please call your pharmacy.

## 2015-12-19 NOTE — Progress Notes (Signed)
Cardiology Office Note  Date:  12/19/2015   ID:  [address] [place] 1970/09/23, MRN [id number]  PCP:  [name], MD   Chief Complaint  Patient presents with  . other    Cardiac clearance for shoulder surgery; [facility] ortho with Dr. [name]. Meds reviewed by the pt. verbally. Pt. was at [facility] ER 12/13/2015 with HTN.     HPI:  46 year old woman with a long hx of smoking, numerous prior strokes,  history of chest pain dating back to 2005 at that time with cardiac catheterization on August 09, 2003 found to have mild plaque in the LAD, but there was no obstructive disease noted.   MUGA, which showed an ejection fraction of 51%.   Transesophageal echocardiogram in the setting of a CVA in May 2010  showed normal LV function, s/p gastric bypass surgery, Who presents for routine followup of her coronary artery disease   cardiac catheterization 06/04/2015, had stent placed to her small diagonal branch  Recently admitted to the hospital with severe hypertension, lab abnormality 12/13/2015,  was making [place] stew all day yesterday She did not take her blood pressure medications in the morning or noon Unclear if she took her medications the day before  she does take Lasix 3 days per week, does not take potassium Denies having any leg edema, shortness of breath, only takes Lasix out of habit  Did not feel well later in the day, had blood pressure checked which was markedly elevated She did not want to go to the emergency room, "EMTs tricked her"  In the hospital she had acute renal failure, creatinine up to 1.94, potassium 2.9 Likely from taking her Lasix Severe hypertension from not taking her blood pressure pills She was given IV fluid hydration, potassium, started back on her blood pressure pills and was discharged home  In follow-up today she reports that she feels fine, would like to proceed with shoulder surgery.Denies any chest pain concerning for angina  EKG on today's visit shows  normal sinus rhythm with rate 69 bpm, no significant ST or T-wave changes   other past medical history reviewed Recent carotid ultrasound reviewed with her showing minimal bilateral carotid disease Recent echocardiogram reviewed with her showing essentially normal ejection fraction  She is status post left carotid to subclavian artery bypass with subclavian artery ligation, done at [facility] by vascular surgery admission to [facility] from 10/5 to 11/13/14 with unstable angina and was found to have severe mid LAD lesion at 85-90% stenosis on diagnositc cardiac cath s/p PCI/DES on staged procedure.   CT in October 2015 showing Right aortic arch with a large retro- esophageal vascular segment, persistent dorsal aortic segment) giving off aberrant left subclavian artery. This large caliber retroaortic segment measuring 18 mm compared to the ascending aorta which measures 22 mm. This large retroesophageal aortic segment along with the partially calcified atretic ductus ligament between the left aberrant subclavian artery and the left pulmonary artery creates an ALMOSTcomplete vascular ring. This is likelythe cause ofpatient's dysphagia [place].  Previously was taking midodrine or Florinef for hypotension following her stroke.  She previously presented to the hospital December 18, 2010 with chest pain.   stress test in the hospital and showed no ischemia. Cardiac enzymes were negative. No EKG changes noted.   admission to the hospital 11/01/2012 with discharge 11/02/2012 with chest pain. She ruled out with negative cardiac enzymes. Blood pressures were in the 916-384 systolic range. Discharge blood pressure medications included amlodipine 10 mg daily,  benazepril 40 mg daily, clonidine 0.2 mg., Coreg 3.125 mg twice a day  stroke and admission to the hospital at the end of December 2014. She had acute onset left sided weakness including arm and leg. She reports that review of MRI showed acute  stroke, as well as previous strokes. She was seen by neurology and aspirin changed to Plavix 75 mg daily. Hemoglobin A1c 8.5. Notes indicate short run of SVT while she was in the hospital and on the monitor.  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  Echocardiogram 01/29/2013 suggest ejection fraction 40-45% CT scan of the head showing small vessel disease MRI showing acute infarct right parietal white matter, extensive chronic microvascular ischemic changes  Echocardiogram April 2014 showed ejection fraction 50-55%, otherwise normal study Stress test 11/02/2012 showing no ischemia,  PMH:   has a past medical history of Anxiety; Aortic arch aneurysm (HCC); CAD (coronary artery disease); Chronic systolic CHF (congestive heart failure) ([place]); CKD (chronic kidney disease), stage IV ([place]); Colon polyp; CVA (cerebral vascular accident) ([place]); Depression; Diabetes mellitus without complication ([place]); Gastric ulcer (04/27/2011); GERD (gastroesophageal reflux disease); Headache; Hyperlipidemia; Hypertension; Hypertensive heart disease; Hypothyroidism; Ischemic cardiomyopathy; Malignant melanoma of skin of scalp ([place]); Morbid obesity ([place]); Multiple sclerosis ([place]) (2015); Orthostatic hypotension; and Syncope.  PSH:    Past Surgical History:  Procedure Laterality Date  . APPENDECTOMY    . CARDIAC CATHETERIZATION N/A 11/09/2014   Procedure: Coronary Angiography;  Surgeon: Minna Merritts, MD;  Location: [place] [facility];  Service: Cardiovascular;  Laterality: N/A;  . CARDIAC CATHETERIZATION N/A 11/12/2014   Procedure: Coronary Stent Intervention;  Surgeon: Isaias Cowman, MD;  Location: [place] [facility];  Service: Cardiovascular;  Laterality: N/A;  . CARDIAC CATHETERIZATION N/A 04/18/2015   Procedure: Left Heart Cath and Coronary Angiography;  Surgeon: Minna Merritts, MD;  Location: [place] [facility]  LAB;  Service: Cardiovascular;  Laterality: N/A;  . CARDIAC CATHETERIZATION Left 06/04/2015   Procedure: Left Heart Cath and Coronary Angiography;  Surgeon: Wellington Hampshire, MD;  Location: [place] [facility];  Service: Cardiovascular;  Laterality: Left;  . CARDIAC CATHETERIZATION N/A 06/04/2015   Procedure: Coronary Stent Intervention;  Surgeon: Wellington Hampshire, MD;  Location: [place] [facility];  Service: Cardiovascular;  Laterality: N/A;  . DILATION AND CURETTAGE OF UTERUS    . ESOPHAGOGASTRODUODENOSCOPY (EGD) WITH PROPOFOL N/A 09/14/2014   Procedure: ESOPHAGOGASTRODUODENOSCOPY (EGD) WITH PROPOFOL;  Surgeon: Josefine Class, MD;  Location[facility] ENDOSCOPY;  Service: Endoscopy;  Laterality: N/A;  . GASTRIC BYPASS  09/2009   [facility]   . Left Carotid to sublcavian artery bypass w/ subclavian artery ligation     a. Performed @ [facility].  . MELANOMA EXCISION  2016   Dr. Evorn Gong  . RIGHT OOPHORECTOMY    . TRIGGER FINGER RELEASE    . TUBAL LIGATION     R tube removed still w/ Left    Current Outpatient Prescriptions  Medication Sig Dispense Refill  . aspirin EC 81 MG tablet Take 1 tablet (81 mg total) by mouth daily.    . baclofen (LIORESAL) 10 MG tablet Take 1 tablet (10 mg total) by mouth 2 (two) times daily as needed for muscle spasms.  0  . citalopram (CELEXA) 40 MG tablet Take 1 tablet (40 mg total) by mouth daily. 30 tablet 5  . clopidogrel (PLAVIX) 75 MG tablet Take 75 mg by mouth daily.    Marland Kitchen  diltiazem (CARDIZEM SR) 120 MG 12 hr capsule Take 120 mg by mouth daily.    . eszopiclone (LUNESTA) 2 MG TABS tablet Take 1 tablet (2 mg total) by mouth at bedtime as needed for sleep. Take immediately before bedtime 30 tablet 0  . gabapentin (NEURONTIN) 300 MG capsule Take 300 mg by mouth 3 (three) times daily.    . isosorbide mononitrate (IMDUR) 60 MG 24 hr tablet Take 1 tablet (60 mg total) by mouth daily. 30 tablet 6  . levothyroxine (SYNTHROID, LEVOTHROID) 25 MCG tablet  Take 1 tablet (25 mcg total) by mouth daily before breakfast. 30 tablet 5  . losartan (COZAAR) 50 MG tablet Take 1 tablet (50 mg total) by mouth daily. 30 tablet 0  . metFORMIN (GLUCOPHAGE) 1000 MG tablet Take 1,000 mg by mouth 2 (two) times daily with a meal.     . nitroGLYCERIN (NITROSTAT) 0.4 MG SL tablet Place 1 tablet (0.4 mg total) under the tongue every 5 (five) minutes as needed for chest pain. 25 tablet 6  . pantoprazole (PROTONIX) 40 MG tablet Take 1 tablet (40 mg total) by mouth 2 (two) times daily. 60 tablet 5  . promethazine (PHENERGAN) 25 MG tablet Take 1 tablet (25 mg total) by mouth every 6 (six) hours as needed for nausea or vomiting. Reported on 05/31/2015 30 tablet 3  . rosuvastatin (CRESTOR) 40 MG tablet Take 1 tablet (40 mg total) by mouth daily at 6 PM. 90 tablet 3  . traZODone (DESYREL) 100 MG tablet Take 2 tablets (200 mg total) by mouth at bedtime. 30 tablet 6   No current facility-administered medications for this visit.      Allergies:   Patient has no known allergies.   Social History:  The patient  reports that she quit smoking about 21 years ago. Her smoking use included Cigarettes. She has never used smokeless tobacco. She reports that she does not drink alcohol or use drugs.   Family History:   family history includes Anxiety disorder in her father and mother; Bipolar disorder in her mother; Breast cancer in her maternal aunt and maternal aunt; Colon cancer in her father; Depression in her father, mother, and sister; Diabetes in her father and sister; Heart disease in her father and mother; Hyperlipidemia in her mother and sister; Hypertension in her father, mother, sister, and sister; Kidney disease in her father and sister; Skin cancer in her father; Stroke in her father; Thyroid nodules in her sister.    Review of Systems: Review of Systems  Constitutional: Negative.   Respiratory: Negative.   Cardiovascular: Negative.   Gastrointestinal: Negative.    Musculoskeletal: Negative.        Shoulder pain  Neurological: Negative.   Psychiatric/Behavioral: Negative.   All other systems reviewed and are negative.    PHYSICAL EXAM: VS:  BP (!) 150/78 (BP Location: Left Arm, Patient Position: Sitting, Cuff Size: Normal)   Pulse 69   Ht 5\' 3"  (1.6 m)   Wt 168 lb 8 oz (76.4 kg)   LMP  (Approximate) Comment: 14 years ago  BMI 29.85 kg/m  , BMI Body mass index is 29.85 kg/m. GEN: Well nourished, well developed, in no acute distress  HEENT: normal  Neck: no JVD, carotid bruits, or masses Cardiac: RRR; no murmurs, rubs, or gallops,no edema  Respiratory:  clear to auscultation bilaterally, normal work of breathing GI: soft, nontender, nondistended, + BS MS: no deformity or atrophy  Skin: warm and dry, no rash Neuro:  Strength  and sensation are intact Psych: euthymic mood, full affect    Recent Labs: 12/13/2015: ALT 16; BUN 24; Creatinine, Ser 1.94; Hemoglobin 11.8; Platelets 221; Sodium 137 12/14/2015: Magnesium 1.1; Potassium 4.0; TSH 3.168    Lipid Panel Lab Results  Component Value Date   CHOL 164 11/09/2014   HDL 27 (L) 11/09/2014   LDLCALC 85 11/09/2014   TRIG 259 (H) 11/09/2014      Wt Readings from Last 3 Encounters:  12/19/15 168 lb 8 oz (76.4 kg)  12/16/15 170 lb 4.9 oz (77.3 kg)  12/14/15 170 lb 6.4 oz (77.3 kg)       ASSESSMENT AND PLAN:  Coronary artery disease involving native coronary artery of native heart with angina pectoris with documented spasm ([place]) - Plan: EKG 12-Lead Currently with no symptoms of angina. No further workup at this time. Continue current medication regimen. Acceptable risk to stop her antiplatelet medication in preparation for her shoulder surgery. Case was discussed with Dr. Fletcher Anon who placed her coronary stent  She will stay on low-dose aspirin throughout the procedure  Chronic systolic CHF (congestive heart failure) ([place]) - Plan: EKG 12-Lead Recommended she only takes Lasix as  needed for ankle swelling If she does take Lasix would take with potassium  Pure hypercholesterolemia Encouraged to stay on her Crestor, goal LDL less than 70  Uncontrolled type 2 diabetes mellitus with other circulatory complication, without long-term current use of insulin ([place])  Chronic kidney disease (CKD), stage III (moderate)  Essential hypertension Did not take her blood pressure medications is morning, is out of her isosorbide Stressed her the importance of compliance with her medications Recent hospital admission for not taking her blood pressure medication   Total encounter time more than 25 minutes  Greater than 50% was spent in counseling and coordination of care with the patient   Disposition:   F/U  6 months   Orders Placed This Encounter  Procedures  . EKG 12-Lead     Signed, Esmond Plants, M.D., Ph.D. 12/19/2015  [place], [place]

## 2015-12-25 ENCOUNTER — Encounter: Payer: Self-pay | Admitting: Family Medicine

## 2015-12-25 ENCOUNTER — Ambulatory Visit (INDEPENDENT_AMBULATORY_CARE_PROVIDER_SITE_OTHER): Payer: BC Managed Care – PPO | Admitting: Family Medicine

## 2015-12-25 VITALS — BP 112/82 | HR 91 | Temp 98.3°F | Resp 16 | Wt 170.0 lb

## 2015-12-25 DIAGNOSIS — R300 Dysuria: Secondary | ICD-10-CM | POA: Diagnosis not present

## 2015-12-25 DIAGNOSIS — Z794 Long term (current) use of insulin: Secondary | ICD-10-CM | POA: Diagnosis not present

## 2015-12-25 DIAGNOSIS — I10 Essential (primary) hypertension: Secondary | ICD-10-CM

## 2015-12-25 DIAGNOSIS — N390 Urinary tract infection, site not specified: Secondary | ICD-10-CM

## 2015-12-25 DIAGNOSIS — E876 Hypokalemia: Secondary | ICD-10-CM

## 2015-12-25 DIAGNOSIS — E1140 Type 2 diabetes mellitus with diabetic neuropathy, unspecified: Secondary | ICD-10-CM | POA: Diagnosis not present

## 2015-12-25 LAB — POCT URINALYSIS DIPSTICK
BILIRUBIN UA: NEGATIVE
GLUCOSE UA: NEGATIVE
KETONES UA: NEGATIVE
Nitrite, UA: NEGATIVE
SPEC GRAV UA: 1.01
Urobilinogen, UA: 0.2
pH, UA: 6

## 2015-12-25 MED ORDER — CIPROFLOXACIN HCL 500 MG PO TABS: 500.0000 mg | ORAL_TABLET | Freq: Two times a day (BID) | ORAL | 0 refills | Status: AC

## 2015-12-25 NOTE — Progress Notes (Signed)
Patient: [name] Female    DOB: 02/06/1970   45 y.o.   MRN: [id number] Visit Date: 12/25/2015  Today's Provider: [name], MD   Chief Complaint  Patient presents with  . Dysuria   Subjective:    Dysuria   This is a new problem. Episode onset: 1 week ago. The problem occurs every urination. The problem has been unchanged. The quality of the pain is described as burning. There has been no fever. Associated symptoms include a discharge, frequency and urgency. Pertinent negatives include no chills, flank pain, hematuria, hesitancy, nausea, sweats or vomiting. She has tried nothing for the symptoms.   Follow up diabetes Lab Results  Component Value Date   HGBA1C 7.3 08/14/2015   She states her blood sugars have been running mostly in the low to mid 100s. Had been well controlled on metformin and Jardiance (and Invokan before that) Since her last diabetic follow up the Jardiance was discontinued during one of her hospitalizations, presumable due to AKI. She is not aware of any other averse effects from medication.   Follow up hospitalization accelerated hypertension Was admitted overnight on 12/13/2015 and discharged the next day for BP of    Which patient attributes to overexerting herself and may have missed her BP medications. Losartan was increase to 50mg , she was also noted to be hypokalemic (K=2.9)   Which was corrected by the time of discharge, had low magnesium level = 1.1 and AKI. With Cr of 1.94. She ruled out for MI with serial troponin I.     No Known Allergies   Current Outpatient Prescriptions:  .  aspirin EC 81 MG tablet, Take 1 tablet (81 mg total) by mouth daily., Disp: , Rfl:  .  baclofen (LIORESAL) 10 MG tablet, Take 1 tablet (10 mg total) by mouth 2 (two) times daily as needed for muscle spasms., Disp: , Rfl: 0 .  citalopram (CELEXA) 40 MG tablet, Take 1 tablet (40 mg total) by mouth daily., Disp: 30 tablet, Rfl: 5 .  clopidogrel (PLAVIX) 75 MG  tablet, Take 75 mg by mouth daily., Disp: , Rfl:  .  diltiazem (CARDIZEM SR) 120 MG 12 hr capsule, Take 120 mg by mouth daily., Disp: , Rfl:  .  eszopiclone (LUNESTA) 2 MG TABS tablet, Take 1 tablet (2 mg total) by mouth at bedtime as needed for sleep. Take immediately before bedtime, Disp: 30 tablet, Rfl: 0 .  gabapentin (NEURONTIN) 300 MG capsule, Take 300 mg by mouth 3 (three) times daily., Disp: , Rfl:  .  isosorbide mononitrate (IMDUR) 60 MG 24 hr tablet, Take 1 tablet (60 mg total) by mouth daily., Disp: 30 tablet, Rfl: 6 .  levothyroxine (SYNTHROID, LEVOTHROID) 25 MCG tablet, Take 1 tablet (25 mcg total) by mouth daily before breakfast., Disp: 30 tablet, Rfl: 5 .  losartan (COZAAR) 50 MG tablet, Take 1 tablet (50 mg total) by mouth daily., Disp: 30 tablet, Rfl: 0 .  metFORMIN (GLUCOPHAGE) 1000 MG tablet, Take 1,000 mg by mouth 2 (two) times daily with a meal. , Disp: , Rfl:  .  nitroGLYCERIN (NITROSTAT) 0.4 MG SL tablet, Place 1 tablet (0.4 mg total) under the tongue every 5 (five) minutes as needed for chest pain., Disp: 25 tablet, Rfl: 6 .  pantoprazole (PROTONIX) 40 MG tablet, Take 1 tablet (40 mg total) by mouth 2 (two) times daily., Disp: 60 tablet, Rfl: 5 .  promethazine (PHENERGAN) 25 MG tablet, Take 1 tablet (25  mg total) by mouth every 6 (six) hours as needed for nausea or vomiting. Reported on 05/31/2015, Disp: 30 tablet, Rfl: 3 .  rosuvastatin (CRESTOR) 40 MG tablet, Take 1 tablet (40 mg total) by mouth daily at 6 PM., Disp: 90 tablet, Rfl: 3 .  traZODone (DESYREL) 100 MG tablet, Take 2 tablets (200 mg total) by mouth at bedtime., Disp: 30 tablet, Rfl: 6  Review of Systems  Constitutional: Negative for appetite change, chills, fatigue and fever.  Respiratory: Negative for chest tightness and shortness of breath.   Cardiovascular: Negative for chest pain and palpitations.  Gastrointestinal: Negative for abdominal pain, nausea and vomiting.  Genitourinary: Positive for dysuria,  frequency and urgency. Negative for flank pain, hematuria and hesitancy.  Neurological: Negative for dizziness and weakness.    Social History  Substance Use Topics  . Smoking status: Former Smoker    Types: Cigarettes    Quit date: 08/31/1994  . Smokeless tobacco: Never Used     Comment: quit 28 years ago  . Alcohol use No   Objective:   BP 112/82 (BP Location: Left Arm, Patient Position: Sitting, Cuff Size: Normal)   Pulse 91   Temp 98.3 F (36.8 C) (Oral)   Resp 16   Wt 170 lb (77.1 kg)   LMP  (Approximate) Comment: 14 years ago  SpO2 97% Comment: room air  BMI 30.11 kg/m   Physical Exam  General Appearance:    Alert, cooperative, no distress  Eyes:    PERRL, conjunctiva/corneas clear, EOM's intact       Lungs:     Clear to auscultation bilaterally, respirations unlabored  Heart:    Regular rate and rhythm  Abdomen:   No CVA tenderness        Assessment & Plan:     1. Dysuria  - POCT Urinalysis Dipstick  2. Urinary tract infection without hematuria, site unspecified Cipro 500mg  bid for 7 days.  - Urine Culture  3. Hypomagnesemia  - Magnesium  4. Controlled type 2 diabetes mellitus with diabetic neuropathy, with long-term current use of insulin (HCC) Previously did well on SGLT-2 but now off apparently due to AKI. Is still on metformin.  - Hemoglobin A1c - Renal function panel  5. Hypokalemia  - Renal function panel  6. Essential hypertension Tolerating increased dose of losartan well with good BP control.        [name], MD  [facility]

## 2015-12-27 LAB — URINE CULTURE

## 2015-12-31 ENCOUNTER — Telehealth: Payer: Self-pay

## 2015-12-31 DIAGNOSIS — N289 Disorder of kidney and ureter, unspecified: Secondary | ICD-10-CM

## 2015-12-31 LAB — RENAL FUNCTION PANEL
ALBUMIN: 4 g/dL (ref 3.5–5.5)
BUN / CREAT RATIO: 10 (ref 9–23)
BUN: 23 mg/dL (ref 6–24)
CALCIUM: 9.5 mg/dL (ref 8.7–10.2)
CHLORIDE: 103 mmol/L (ref 96–106)
CO2: 22 mmol/L (ref 18–29)
Creatinine, Ser: 2.32 mg/dL — ABNORMAL HIGH (ref 0.57–1.00)
GFR calc non Af Amer: 24 mL/min/{1.73_m2} — ABNORMAL LOW (ref >59)
GFR, EST AFRICAN AMERICAN: 28 mL/min/{1.73_m2} — AB (ref >59)
GLUCOSE: 86 mg/dL (ref 65–99)
POTASSIUM: 4.4 mmol/L (ref 3.5–5.2)
Phosphorus: 3.8 mg/dL (ref 2.5–4.5)
Sodium: 145 mmol/L — ABNORMAL HIGH (ref 134–144)

## 2015-12-31 LAB — HEMOGLOBIN A1C
Est. average glucose Bld gHb Est-mCnc: 108 mg/dL
Hgb A1c MFr Bld: 5.4 % (ref 4.8–5.6)

## 2015-12-31 LAB — MAGNESIUM: Magnesium: 1.4 mg/dL — ABNORMAL LOW (ref 1.6–2.3)

## 2015-12-31 NOTE — Telephone Encounter (Addendum)
Per patient request ( verbal order over phone) wants her recent labs faxed to her kidney Dr.Lateef at [facility]. Patient provided Fax # [phone] and phone # [phone]. Thanks CC

## 2015-12-31 NOTE — Telephone Encounter (Signed)
Ok, please fax labs to her nephrologist. Thanks.

## 2015-12-31 NOTE — Telephone Encounter (Signed)
-----   Message from [name], MD sent at 12/31/2015  7:46 AM EST ----- Kidney functions have worsened. Magnesium levels are very level. Please double check and see if she is still taking metformin twice a day. If so she needs to reduce to once a day due to decline in kidney functions. Also, need to start OTC magnesium oxide 500mg  twice a day due to low magnesium. Need to drink more water.  A1c is good at 5.4   Please see when her next appointment with nephrology is.  Need to recheck renal panel and magnesium level in 1 month.

## 2015-12-31 NOTE — Telephone Encounter (Signed)
Patient was advised an future order was placed in chart, patient was instructed to reduce Metformin to once a day. Patient reports that her nephrology appt is Feb 2018. KW

## 2016-01-02 ENCOUNTER — Encounter
Admission: RE | Admit: 2016-01-02 | Discharge: 2016-01-02 | Disposition: A | Discharge status: Home / Self Care | Payer: BC Managed Care – PPO | Source: Ambulatory Visit | Attending: Surgery | Admitting: Surgery

## 2016-01-02 DIAGNOSIS — Z01812 Encounter for preprocedural laboratory examination: Secondary | ICD-10-CM | POA: Insufficient documentation

## 2016-01-02 HISTORY — DX: Bipolar disorder, unspecified: F31.9

## 2016-01-02 HISTORY — DX: Anemia, unspecified: D64.9

## 2016-01-02 HISTORY — DX: Myoneural disorder, unspecified: G70.9

## 2016-01-02 LAB — CBC
HEMATOCRIT: 36.3 % (ref 35.0–47.0)
Hemoglobin: 12.2 g/dL (ref 12.0–16.0)
MCH: 27.4 pg (ref 26.0–34.0)
MCHC: 33.6 g/dL (ref 32.0–36.0)
MCV: 81.7 fL (ref 80.0–100.0)
Platelets: 231 10*3/uL (ref 150–440)
RBC: 4.45 MIL/uL (ref 3.80–5.20)
RDW: 15.5 % — AB (ref 11.5–14.5)
WBC: 13.7 10*3/uL — ABNORMAL HIGH (ref 3.6–11.0)

## 2016-01-02 LAB — DIFFERENTIAL
BASOS ABS: 0.1 10*3/uL (ref 0–0.1)
BASOS PCT: 1 %
EOS ABS: 0.2 10*3/uL (ref 0–0.7)
EOS PCT: 1 %
Lymphocytes Relative: 11 %
Lymphs Abs: 1.4 10*3/uL (ref 1.0–3.6)
MONO ABS: 0.6 10*3/uL (ref 0.2–0.9)
MONOS PCT: 4 %
Neutro Abs: 11.4 10*3/uL — ABNORMAL HIGH (ref 1.4–6.5)
Neutrophils Relative %: 83 %

## 2016-01-02 NOTE — Patient Instructions (Signed)
  Your procedure is scheduled on: January 07, 2016 (Tuesday) Report to Same Day Surgery 2nd floor medical mall [facility] Entrance-take elevator on left to 2nd floor.  Check in with surgery information desk.) To find out your arrival time please call [phone] between 1PM - 3PM on January 06, 2016 (Monday)  Remember: Instructions that are not followed completely may result in serious medical risk, up to and including death, or upon the discretion of your surgeon and anesthesiologist your surgery may need to be rescheduled.    _x___ 1. Do not eat food or drink liquids after midnight. No gum chewing or hard candies.     __x__ 2. No Alcohol for 24 hours before or after surgery.   __x__3. No Smoking for 24 prior to surgery.   ____  4. Bring all medications with you on the day of surgery if instructed.    __x__ 5. Notify your doctor if there is any change in your medical condition     (cold, fever, infections).     Do not wear jewelry, make-up, hairpins, clips or nail polish.  Do not wear lotions, powders, or perfumes. You may wear deodorant.  Do not shave 48 hours prior to surgery. Men may shave face and neck.  Do not bring valuables to the hospital.    [facility] is not responsible for any belongings or valuables.               Contacts, dentures or bridgework may not be worn into surgery.  Leave your suitcase in the car. After surgery it may be brought to your room.  For patients admitted to the hospital, discharge time is determined by your treatment team.   Patients discharged the day of surgery will not be allowed to drive home.  You will need someone to drive you home and stay with you the night of your procedure.    Please read over the following fact sheets that you were given:   [facility] Preparing for Surgery and or MRSA Information   _x___ Take these medicines the morning of surgery with A SIP OF WATER:    1. Citalopram  2.Diltiazem  3.Gabapentin  4.  Losartan  5. Isosorbide  6. Levothyroxine         7. Magnesium       8. Pantoprazole    ____Fleets enema or Magnesium Citrate as directed.   _x___ Use CHG Soap or sage wipes as directed on instruction sheet   ____ Use inhalers on the day of surgery and bring to hospital day of surgery  _x___ Stop metformin 2 days prior to surgery (Stop Metformin on December 3)    ____ Take 1/2 of usual insulin dose the night before surgery and none on the morning of           surgery.   __x__ Stop Aspirin, Coumadin, Pllavix ,Eliquis, Effient, or Pradaxa (Continue Aspirin but do not take the morning of surgery)  x__ Stop Anti-inflammatories such as Advil, Aleve, Ibuprofen, Motrin, Naproxen,          Naprosyn, Goodies powders or aspirin products. Ok to take Tylenol.   _x___ Stop supplements until after surgery.  (Stop Hair, Skin and Nails , and Vitamin B-12 Gummies NOW)  ____ Bring C-Pap to the hospital.

## 2016-01-02 NOTE — Pre-Procedure Instructions (Signed)
DISCUSSED PATIENT HISTORY WITH DR Randa Lynn / HAS CARDIAC CLEARANCE / NO MEDS FOR MS. OK TO PROCEED WITHOUT FURTHER WORKUP

## 2016-01-06 IMAGING — CR RIGHT INDEX FINGER 2+V
1 series · 3 of 3 positions shown · non-contrast
Comparison: None.

CLINICAL DATA: Pain in the right second digit for 1 month, no known
injury

EXAM:
RIGHT INDEX FINGER 2+V

[Series 1: kdxr finger index 2nd dig rt han · 0.14mm/px · 3 of 3 slices shown]
[im 1/3]
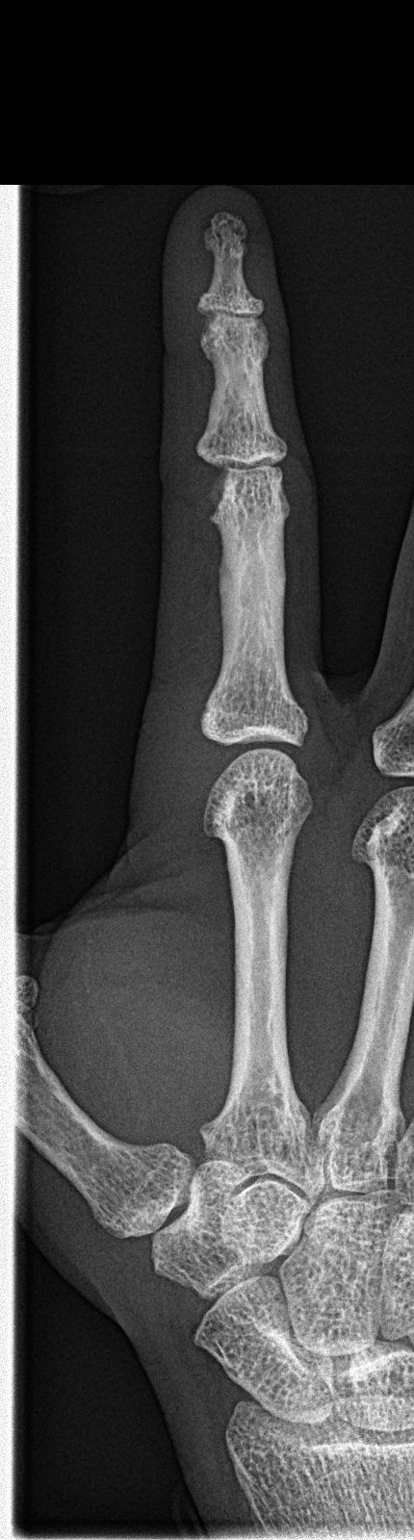
[im 2/3]
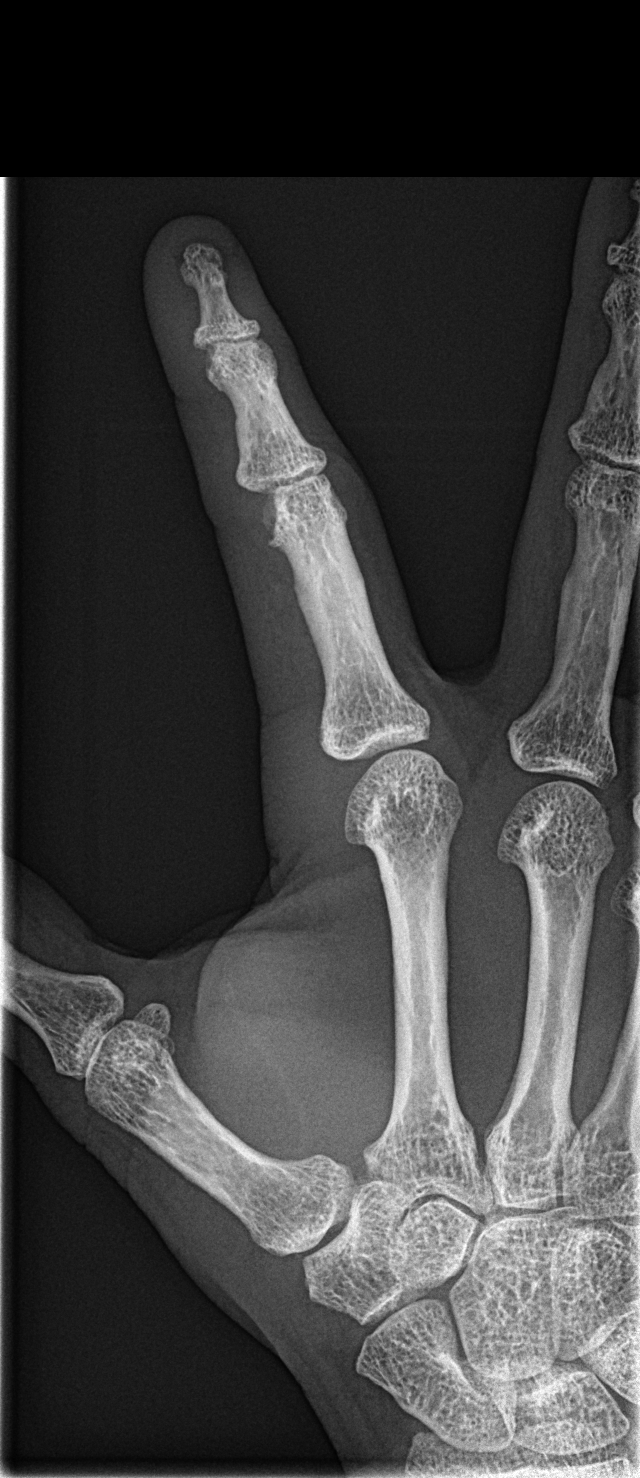
[im 3/3]
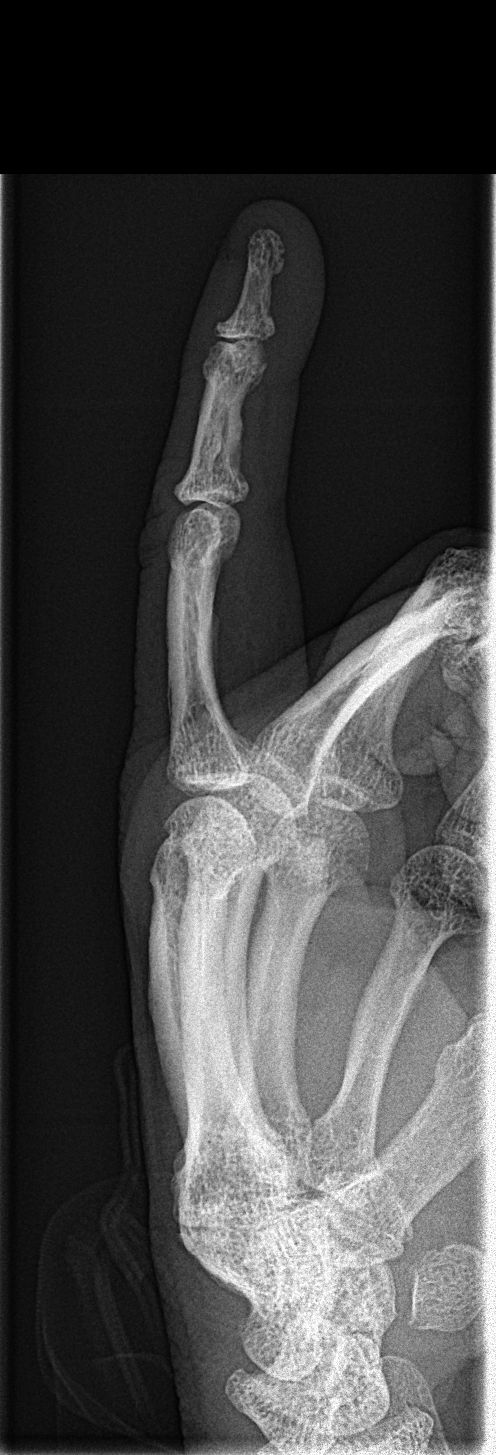

[3 of 3 positions shown; findings below may reference images not displayed]

FINDINGS: No acute fracture is seen. However small erosions cannot be excluded
involving the distal aspect the of the proximal phalanx and the
middle phalanx of the right second digit. An erosive arthritis is
the primary consideration. Correlate clinically. Mild degenerative
change of the DIP joint is noted as well.
IMPRESSION: Question small erosions involving the distal aspects of the proximal
and middle phalanges of the second digit. Possible erosive
arthropathy. Correlate clinically.

## 2016-01-07 ENCOUNTER — Ambulatory Visit: Payer: BC Managed Care – PPO | Admitting: Anesthesiology

## 2016-01-07 ENCOUNTER — Ambulatory Visit
Admission: RE | Admit: 2016-01-07 | Discharge: 2016-01-07 | Disposition: A | Discharge status: Home / Self Care | Payer: BC Managed Care – PPO | Source: Ambulatory Visit | Attending: Surgery | Admitting: Surgery

## 2016-01-07 ENCOUNTER — Encounter
Admission: RE | Disposition: A | Discharge status: Home / Self Care | Payer: Self-pay | Source: Ambulatory Visit | Attending: Surgery

## 2016-01-07 DIAGNOSIS — I255 Ischemic cardiomyopathy: Secondary | ICD-10-CM | POA: Insufficient documentation

## 2016-01-07 DIAGNOSIS — E7800 Pure hypercholesterolemia, unspecified: Secondary | ICD-10-CM | POA: Insufficient documentation

## 2016-01-07 DIAGNOSIS — Z9884 Bariatric surgery status: Secondary | ICD-10-CM | POA: Insufficient documentation

## 2016-01-07 DIAGNOSIS — K279 Peptic ulcer, site unspecified, unspecified as acute or chronic, without hemorrhage or perforation: Secondary | ICD-10-CM | POA: Insufficient documentation

## 2016-01-07 DIAGNOSIS — F319 Bipolar disorder, unspecified: Secondary | ICD-10-CM | POA: Insufficient documentation

## 2016-01-07 DIAGNOSIS — N183 Chronic kidney disease, stage 3 (moderate): Secondary | ICD-10-CM | POA: Diagnosis not present

## 2016-01-07 DIAGNOSIS — M65811 Other synovitis and tenosynovitis, right shoulder: Secondary | ICD-10-CM | POA: Diagnosis not present

## 2016-01-07 DIAGNOSIS — Z85820 Personal history of malignant melanoma of skin: Secondary | ICD-10-CM | POA: Insufficient documentation

## 2016-01-07 DIAGNOSIS — M7541 Impingement syndrome of right shoulder: Secondary | ICD-10-CM | POA: Insufficient documentation

## 2016-01-07 DIAGNOSIS — I712 Thoracic aortic aneurysm, without rupture: Secondary | ICD-10-CM | POA: Diagnosis not present

## 2016-01-07 DIAGNOSIS — E1122 Type 2 diabetes mellitus with diabetic chronic kidney disease: Secondary | ICD-10-CM | POA: Insufficient documentation

## 2016-01-07 DIAGNOSIS — M75111 Incomplete rotator cuff tear or rupture of right shoulder, not specified as traumatic: Secondary | ICD-10-CM | POA: Diagnosis not present

## 2016-01-07 DIAGNOSIS — E039 Hypothyroidism, unspecified: Secondary | ICD-10-CM | POA: Insufficient documentation

## 2016-01-07 DIAGNOSIS — F419 Anxiety disorder, unspecified: Secondary | ICD-10-CM | POA: Insufficient documentation

## 2016-01-07 DIAGNOSIS — I25119 Atherosclerotic heart disease of native coronary artery with unspecified angina pectoris: Secondary | ICD-10-CM | POA: Insufficient documentation

## 2016-01-07 DIAGNOSIS — I5022 Chronic systolic (congestive) heart failure: Secondary | ICD-10-CM | POA: Diagnosis not present

## 2016-01-07 DIAGNOSIS — K219 Gastro-esophageal reflux disease without esophagitis: Secondary | ICD-10-CM | POA: Diagnosis not present

## 2016-01-07 DIAGNOSIS — I050 Rheumatic mitral stenosis: Secondary | ICD-10-CM | POA: Insufficient documentation

## 2016-01-07 DIAGNOSIS — Z7984 Long term (current) use of oral hypoglycemic drugs: Secondary | ICD-10-CM | POA: Insufficient documentation

## 2016-01-07 DIAGNOSIS — D649 Anemia, unspecified: Secondary | ICD-10-CM | POA: Insufficient documentation

## 2016-01-07 DIAGNOSIS — Z6830 Body mass index (BMI) 30.0-30.9, adult: Secondary | ICD-10-CM | POA: Insufficient documentation

## 2016-01-07 DIAGNOSIS — E1151 Type 2 diabetes mellitus with diabetic peripheral angiopathy without gangrene: Secondary | ICD-10-CM | POA: Insufficient documentation

## 2016-01-07 DIAGNOSIS — I252 Old myocardial infarction: Secondary | ICD-10-CM | POA: Diagnosis not present

## 2016-01-07 DIAGNOSIS — M1990 Unspecified osteoarthritis, unspecified site: Secondary | ICD-10-CM | POA: Diagnosis not present

## 2016-01-07 DIAGNOSIS — Z7982 Long term (current) use of aspirin: Secondary | ICD-10-CM | POA: Insufficient documentation

## 2016-01-07 DIAGNOSIS — G35 Multiple sclerosis: Secondary | ICD-10-CM | POA: Insufficient documentation

## 2016-01-07 DIAGNOSIS — Z87891 Personal history of nicotine dependence: Secondary | ICD-10-CM | POA: Insufficient documentation

## 2016-01-07 DIAGNOSIS — E782 Mixed hyperlipidemia: Secondary | ICD-10-CM | POA: Insufficient documentation

## 2016-01-07 DIAGNOSIS — Z86010 Personal history of colonic polyps: Secondary | ICD-10-CM | POA: Insufficient documentation

## 2016-01-07 DIAGNOSIS — I130 Hypertensive heart and chronic kidney disease with heart failure and stage 1 through stage 4 chronic kidney disease, or unspecified chronic kidney disease: Secondary | ICD-10-CM | POA: Insufficient documentation

## 2016-01-07 HISTORY — PX: SHOULDER ARTHROSCOPY WITH OPEN ROTATOR CUFF REPAIR: SHX6092

## 2016-01-07 LAB — GLUCOSE, CAPILLARY
GLUCOSE-CAPILLARY: 111 mg/dL — AB (ref 65–99)
GLUCOSE-CAPILLARY: 104 mg/dL — AB (ref 65–99)
Glucose-Capillary: 67 mg/dL (ref 65–99)

## 2016-01-07 LAB — POCT PREGNANCY, URINE: PREG TEST UR: NEGATIVE

## 2016-01-07 SURGERY — ARTHROSCOPY, SHOULDER WITH REPAIR, ROTATOR CUFF, OPEN
Anesthesia: General | Site: Shoulder | Laterality: Right | Wound class: Clean

## 2016-01-07 MED ORDER — OXYCODONE HCL 5 MG PO TABS: 5.0000 mg | ORAL_TABLET | ORAL | 0 refills | Status: DC | PRN

## 2016-01-07 MED ORDER — PROPOFOL 10 MG/ML IV BOLUS
INTRAVENOUS | Status: DC | PRN
  Administered 2016-01-07: 120 mg via INTRAVENOUS

## 2016-01-07 MED ORDER — SODIUM CHLORIDE 0.9 % IV SOLN
INTRAVENOUS | Status: DC
  Administered 2016-01-07 (×2): via INTRAVENOUS

## 2016-01-07 MED ORDER — METOCLOPRAMIDE HCL 5 MG/ML IJ SOLN: 5.0000 mg | Freq: Three times a day (TID) | INTRAMUSCULAR | Status: DC | PRN

## 2016-01-07 MED ORDER — FENTANYL CITRATE (PF) 100 MCG/2ML IJ SOLN
INTRAMUSCULAR | Status: DC | PRN
  Administered 2016-01-07: 50 ug via INTRAVENOUS

## 2016-01-07 MED ORDER — BUPIVACAINE HCL (PF) 0.5 % IJ SOLN
INTRAMUSCULAR | Status: AC
  Filled 2016-01-07: qty 30

## 2016-01-07 MED ORDER — ACETAMINOPHEN 10 MG/ML IV SOLN
INTRAVENOUS | Status: DC | PRN
  Administered 2016-01-07: 1000 mg via INTRAVENOUS

## 2016-01-07 MED ORDER — LIDOCAINE HCL (CARDIAC) 20 MG/ML IV SOLN
INTRAVENOUS | Status: DC | PRN
  Administered 2016-01-07: 40 mg via INTRAVENOUS

## 2016-01-07 MED ORDER — DEXTROSE 50 % IV SOLN
INTRAVENOUS | Status: AC
  Administered 2016-01-07: 25 mL via INTRAVENOUS
  Filled 2016-01-07: qty 50

## 2016-01-07 MED ORDER — SUCCINYLCHOLINE CHLORIDE 20 MG/ML IJ SOLN
INTRAMUSCULAR | Status: DC | PRN
  Administered 2016-01-07: 80 mg via INTRAVENOUS

## 2016-01-07 MED ORDER — PHENYLEPHRINE HCL 10 MG/ML IJ SOLN
INTRAMUSCULAR | Status: DC | PRN
  Administered 2016-01-07: 200 ug via INTRAVENOUS
  Administered 2016-01-07 (×4): 100 ug via INTRAVENOUS

## 2016-01-07 MED ORDER — FENTANYL CITRATE (PF) 100 MCG/2ML IJ SOLN
50.0000 ug | Freq: Once | INTRAMUSCULAR | Status: AC
  Administered 2016-01-07: 50 ug via INTRAVENOUS

## 2016-01-07 MED ORDER — MIDAZOLAM HCL 2 MG/2ML IJ SOLN
1.0000 mg | Freq: Once | INTRAMUSCULAR | Status: AC
  Administered 2016-01-07: 1 mg via INTRAVENOUS

## 2016-01-07 MED ORDER — ROCURONIUM BROMIDE 100 MG/10ML IV SOLN
INTRAVENOUS | Status: DC | PRN
  Administered 2016-01-07: 35 mg via INTRAVENOUS
  Administered 2016-01-07: 10 mg via INTRAVENOUS
  Administered 2016-01-07: 5 mg via INTRAVENOUS

## 2016-01-07 MED ORDER — POTASSIUM CHLORIDE IN NACL 20-0.9 MEQ/L-% IV SOLN: INTRAVENOUS | Status: DC

## 2016-01-07 MED ORDER — DEXTROSE 50 % IV SOLN
25.0000 mL | Freq: Once | INTRAVENOUS | Status: AC
  Administered 2016-01-07: 25 mL via INTRAVENOUS

## 2016-01-07 MED ORDER — CEFAZOLIN SODIUM-DEXTROSE 2-4 GM/100ML-% IV SOLN
INTRAVENOUS | Status: AC
  Filled 2016-01-07: qty 100

## 2016-01-07 MED ORDER — EPINEPHRINE PF 1 MG/ML IJ SOLN
INTRAMUSCULAR | Status: DC | PRN
  Administered 2016-01-07: 2 mg

## 2016-01-07 MED ORDER — ONDANSETRON HCL 4 MG/2ML IJ SOLN: 4.0000 mg | Freq: Once | INTRAMUSCULAR | Status: DC | PRN

## 2016-01-07 MED ORDER — ONDANSETRON HCL 4 MG/2ML IJ SOLN: 4.0000 mg | Freq: Four times a day (QID) | INTRAMUSCULAR | Status: DC | PRN

## 2016-01-07 MED ORDER — BUPIVACAINE-EPINEPHRINE 0.25% -1:200000 IJ SOLN
INTRAMUSCULAR | Status: DC | PRN
  Administered 2016-01-07: 30 mL

## 2016-01-07 MED ORDER — METOCLOPRAMIDE HCL 10 MG PO TABS: 5.0000 mg | ORAL_TABLET | Freq: Three times a day (TID) | ORAL | Status: DC | PRN

## 2016-01-07 MED ORDER — BUPIVACAINE-EPINEPHRINE (PF) 0.25% -1:200000 IJ SOLN
INTRAMUSCULAR | Status: AC
Start: 2016-01-07 — End: 2016-01-07
  Filled 2016-01-07: qty 30

## 2016-01-07 MED ORDER — MIDAZOLAM HCL 2 MG/2ML IJ SOLN
INTRAMUSCULAR | Status: DC | PRN
  Administered 2016-01-07: 1 mg via INTRAVENOUS

## 2016-01-07 MED ORDER — FENTANYL CITRATE (PF) 100 MCG/2ML IJ SOLN
INTRAMUSCULAR | Status: AC
  Administered 2016-01-07: 50 ug via INTRAVENOUS
  Filled 2016-01-07: qty 2

## 2016-01-07 MED ORDER — ONDANSETRON HCL 4 MG PO TABS: 4.0000 mg | ORAL_TABLET | Freq: Four times a day (QID) | ORAL | Status: DC | PRN

## 2016-01-07 MED ORDER — ONDANSETRON HCL 4 MG/2ML IJ SOLN
INTRAMUSCULAR | Status: DC | PRN
  Administered 2016-01-07: 4 mg via INTRAVENOUS

## 2016-01-07 MED ORDER — SODIUM CHLORIDE 0.9 % IV SOLN
INTRAVENOUS | Status: DC | PRN
  Administered 2016-01-07: 20 ug/min via INTRAVENOUS

## 2016-01-07 MED ORDER — FENTANYL CITRATE (PF) 100 MCG/2ML IJ SOLN: 25.0000 ug | INTRAMUSCULAR | Status: DC | PRN

## 2016-01-07 MED ORDER — CEFAZOLIN SODIUM-DEXTROSE 2-4 GM/100ML-% IV SOLN
2.0000 g | Freq: Once | INTRAVENOUS | Status: AC
  Administered 2016-01-07: 2 g via INTRAVENOUS

## 2016-01-07 MED ORDER — SUGAMMADEX SODIUM 200 MG/2ML IV SOLN
INTRAVENOUS | Status: DC | PRN
  Administered 2016-01-07: 154.2 mg via INTRAVENOUS

## 2016-01-07 MED ORDER — MIDAZOLAM HCL 2 MG/2ML IJ SOLN
INTRAMUSCULAR | Status: AC
  Administered 2016-01-07: 1 mg via INTRAVENOUS
  Filled 2016-01-07: qty 2

## 2016-01-07 MED ORDER — OXYCODONE HCL 5 MG PO TABS
5.0000 mg | ORAL_TABLET | ORAL | Status: DC | PRN
Start: 2016-01-07 — End: 2016-01-07

## 2016-01-07 MED ORDER — EPINEPHRINE PF 1 MG/ML IJ SOLN
INTRAMUSCULAR | Status: AC
  Filled 2016-01-07: qty 3

## 2016-01-07 SURGICAL SUPPLY — 45 items
BIT DRILL JUGRKNT W/NDL BIT2.9 (DRILL) ×2 IMPLANT
BLADE FULL RADIUS 3.5 (BLADE) ×3 IMPLANT
BLADE SHAVER 4.5X7 STR FR (MISCELLANEOUS) ×1 IMPLANT
BUR ACROMIONIZER 4.0 (BURR) ×1 IMPLANT
BUR BR 5.5 WIDE MOUTH (BURR) ×1 IMPLANT
CANNULA SHAVER 8MMX76MM (CANNULA) ×3 IMPLANT
CHLORAPREP W/TINT 26ML (MISCELLANEOUS) ×6 IMPLANT
COVER MAYO STAND STRL (DRAPES) ×3 IMPLANT
DRAPE IMP U-DRAPE 54X76 (DRAPES) ×6 IMPLANT
DRILL JUGGERKNOT W/NDL BIT 2.9 (DRILL)
DRSG OPSITE POSTOP 4X8 (GAUZE/BANDAGES/DRESSINGS) ×3 IMPLANT
ELECT REM PT RETURN 9FT ADLT (ELECTROSURGICAL) ×3
ELECTRODE REM PT RTRN 9FT ADLT (ELECTROSURGICAL) ×1 IMPLANT
GAUZE PETRO XEROFOAM 1X8 (MISCELLANEOUS) ×3 IMPLANT
GAUZE SPONGE 4X4 12PLY STRL (GAUZE/BANDAGES/DRESSINGS) ×3 IMPLANT
GLOVE BIO SURGEON STRL SZ7.5 (GLOVE) ×6 IMPLANT
GLOVE BIO SURGEON STRL SZ8 (GLOVE) ×6 IMPLANT
GLOVE BIOGEL PI IND STRL 8 (GLOVE) ×1 IMPLANT
GLOVE BIOGEL PI INDICATOR 8 (GLOVE) ×2
GLOVE INDICATOR 8.0 STRL GRN (GLOVE) ×3 IMPLANT
GOWN STRL REUS W/ TWL LRG LVL3 (GOWN DISPOSABLE) ×2 IMPLANT
GOWN STRL REUS W/ TWL XL LVL3 (GOWN DISPOSABLE) ×1 IMPLANT
GOWN STRL REUS W/TWL LRG LVL3 (GOWN DISPOSABLE) ×6
GOWN STRL REUS W/TWL XL LVL3 (GOWN DISPOSABLE) ×3
GRASPER SUT 15 45D LOW PRO (SUTURE) ×2 IMPLANT
IV LACTATED RINGER IRRG 3000ML (IV SOLUTION) ×6
IV LR IRRIG 3000ML ARTHROMATIC (IV SOLUTION) ×2 IMPLANT
MANIFOLD NEPTUNE II (INSTRUMENTS) ×3 IMPLANT
MASK FACE SPIDER DISP (MASK) ×3 IMPLANT
MAT BLUE FLOOR 46X72 FLO (MISCELLANEOUS) ×3 IMPLANT
NDL REVERSE CUT 1/2 CRC (NEEDLE) ×1 IMPLANT
NEEDLE REVERSE CUT 1/2 CRC (NEEDLE) ×3 IMPLANT
PACK ARTHROSCOPY SHOULDER (MISCELLANEOUS) ×3 IMPLANT
SLING ARM LRG DEEP (SOFTGOODS) ×3 IMPLANT
SLING ULTRA II LG (MISCELLANEOUS) ×3 IMPLANT
STAPLER SKIN PROX 35W (STAPLE) ×3 IMPLANT
STRAP SAFETY BODY (MISCELLANEOUS) ×3 IMPLANT
SUT ETHIBOND 0 MO6 C/R (SUTURE) ×3 IMPLANT
SUT VIC AB 2-0 CT1 27 (SUTURE) ×6
SUT VIC AB 2-0 CT1 TAPERPNT 27 (SUTURE) ×2 IMPLANT
TAPE MICROFOAM 4IN (TAPE) ×3 IMPLANT
TUBING ARTHRO INFLOW-ONLY STRL (TUBING) ×3 IMPLANT
TUBING CONNECTING 10 (TUBING) ×2 IMPLANT
TUBING CONNECTING 10' (TUBING) ×1
WAND HAND CNTRL MULTIVAC 90 (MISCELLANEOUS) ×3 IMPLANT

## 2016-01-07 NOTE — Discharge Instructions (Addendum)
Keep dressing dry and intact.  °May shower after dressing changed on post-op day #4 (Saturday).  °Cover staples with Band-Aids after drying off. °Apply ice frequently to shoulder. °Take Aleve 2 tabs BID with meals for 7-10 days, then as necessary. °Take oxycodone as prescribed when needed.  °May supplement with ES Tylenol if necessary. °Keep shoulder immobilizer on at all times except may remove for bathing purposes. °Follow-up in 10-14 days or as scheduled. ° ° ° °AMBULATORY SURGERY  °DISCHARGE INSTRUCTIONS ° ° °1) The drugs that you were given will stay in your system until tomorrow so for the next 24 hours you should not: ° °A) Drive an automobile °B) Make any legal decisions °C) Drink any alcoholic beverage ° ° °2) You may resume regular meals tomorrow.  Today it is better to start with liquids and gradually work up to solid foods. ° °You may eat anything you prefer, but it is better to start with liquids, then soup and crackers, and gradually work up to solid foods. ° ° °3) Please notify your doctor immediately if you have any unusual bleeding, trouble breathing, redness and pain at the surgery site, drainage, fever, or pain not relieved by medication. ° ° ° °4) Additional Instructions: ° ° ° ° ° ° ° °Please contact your physician with any problems or Same Day Surgery at [phone], Monday through Friday 6 am to 4 pm, or [place] [facility] Main number at [phone]. °

## 2016-01-07 NOTE — H&P (Signed)
Paper H&P to be scanned into permanent record. H&P reviewed. No changes. 

## 2016-01-07 NOTE — Op Note (Addendum)
01/07/2016  3:22 PM  Patient:   [name]  Pre-Op Diagnosis:   Impingement/tendinopathy, right shoulder.  Postoperative diagnosis: Impingement/tendinopathy with partial-thickness rotator cuff tear and labral fraying, right shoulder.  Procedure: Limited arthroscopic debridement and arthroscopic subacromial decompression, right shoulder.  Anesthesia: General endotracheal with interscalene block placed preoperatively by the anesthesiologist.  Surgeon:   Pascal Lux, MD  Assistant:   None  Findings: As above. The labrum demonstrated fraying and inflammation anteriorly and additional fraying superiorly but showed no detachment from the glenoid. The biceps tendon was in satisfactory condition as was the articular surface the rotator cuff. There was some bursal surface fraying involving the anterior insertional fibers of the supraspinatus tendon with compromise of at most 10% of the thickness of the tendon. The remainder of the rotator cuff was in excellent condition, as were the articular surfaces of the glenoid and humerus.  Complications: None  Fluids:   700 cc  Estimated blood loss: 2 cc  Tourniquet time: None  Drains: None  Closure: Staples   Brief clinical note: The patient is a 45 year old female with a history of persistent right shoulder pain.. The patient's symptoms have progressed despite medications, activity modification, etc. The patient's history and examination are consistent with impingement/tendinopathy with a possible rotator cuff tear. A recent MRI scan confirmed the presence of tendinopathic changes with a possible partial thickness bursal surface tear. The patient presents at this time for definitive management of these shoulder symptoms.  Procedure: The patient underwent placement of an interscalene block by the anesthesiologist in the preoperative holding area before she was brought into the operating room and lain in the supine  position. The patient then underwent general endotracheal intubation and anesthesia before being repositioned in the beach chair position using the beach chair positioner. The right shoulder and upper extremity were prepped with ChloraPrep solution before being draped sterilely. Preoperative antibiotics were administered. A timeout was performed to confirm the proper surgical site before the expected portal sites and incision site were injected with 0.5% Sensorcaine with epinephrine. A posterior portal was created and the glenohumeral joint thoroughly inspected with the findings as described above. An anterior portal was created using an outside-in technique. The labrum and rotator cuff were further probed, again confirming the above-noted findings. Areas of reactive synovitis were debrided using the full-radius resector before the ArthroCare wand was inserted and used to obtain hemostasis as well as to "anneal" the labrum superiorly and anteriorly. The instruments were removed from the joint after suctioning the excess fluid.  The camera was repositioned through the posterior portal into the subacromial space. A separate lateral portal was created using an outside-in technique. The 3.5 mm full-radius resector was introduced and used to perform a subtotal bursectomy. The ArthroCare wand was then inserted and used to remove the periosteal tissue off the undersurface of the anterior third of the acromion as well as to recess the coracoacromial ligament from its attachment along the anterior and lateral margins of the acromion. The 4.0 mm acromionizing bur was introduced and used to complete the decompression by removing the undersurface of the anterior third of the acromion. The full radius resector was reintroduced to remove any residual bony debris before the ArthroCare wand was reintroduced to obtain hemostasis. The instruments were then removed from the subacromial space after suctioning the excess fluid.  An  approximately 3.5-4 cm incision was made over the anterolateral aspect of the shoulder beginning at the anterolateral corner of the acromion and extending  distally in line with the bicipital groove. This incision was carried down through the subcutaneous tissues to expose the deltoid fascia. The raphae between the anterior and middle thirds was identified and this plane developed to provide access into the subacromial space. Additional bursal tissues were debrided sharply using Metzenbaum scissors. The rotator cuff tear was carefully inspected. There indeed was bursal surface fraying involving the anterior portion of the supraspinatus, but only approximately 10% of the rotator cuff thickness was involved. Therefore, the frayed margins were debrided back to stable margins using Metzenbaum scissors.  The wound was copiously irrigated with sterile saline solution before the deltoid raphae was reapproximated using 2-0 Vicryl interrupted sutures. The subcutaneous tissues were closed in two layers using 2-0 Vicryl interrupted sutures before the skin was closed using staples. The portal sites also were closed using staples. A sterile bulky dressing was applied to the shoulder before the arm was placed into a shoulder sling. The patient was then awakened, extubated, and returned to the recovery room in satisfactory condition after tolerating the procedure well.

## 2016-01-07 NOTE — Anesthesia Procedure Notes (Signed)
Procedure Name: Intubation Date/Time: 01/07/2016 2:00 PM Performed by: Allean Found Pre-anesthesia Checklist: Patient identified, Emergency Drugs available, Suction available, Patient being monitored and Timeout performed Patient Re-evaluated:Patient Re-evaluated prior to inductionOxygen Delivery Method: Circle system utilized Preoxygenation: Pre-oxygenation with 100% oxygen Intubation Type: IV induction Ventilation: Mask ventilation without difficulty Laryngoscope Size: Mac and 3 Grade View: Grade I Tube size: 7.0 mm Number of attempts: 1 Airway Equipment and Method: Stylet Placement Confirmation: ETT inserted through vocal cords under direct vision,  positive ETCO2 and breath sounds checked- equal and bilateral Secured at: 21 cm Tube secured with: Tape Dental Injury: Teeth and Oropharynx as per pre-operative assessment

## 2016-01-07 NOTE — Anesthesia Procedure Notes (Signed)
Anesthesia Regional Block:  Interscalene brachial plexus block  Pre-Anesthetic Checklist: ,, timeout performed, Correct Patient, Correct Site, Correct Laterality, Correct Procedure, Correct Position, site marked, Risks and benefits discussed,  Surgical consent,  Pre-op evaluation,  At surgeon's request and post-op pain management  Laterality: Right  Prep: Maximum Sterile Barrier Precautions used, chloraprep, alcohol swabs       Needles:  Injection technique: Single-shot  Needle Type: Stimiplex     Needle Length: 9cm 9 cm Needle Gauge: 22 and 22 G    Additional Needles:  Procedures: ultrasound guided (picture in chart) and nerve stimulator Interscalene brachial plexus block  Nerve Stimulator or Paresthesia:  Response: 0.55 mA,   Additional Responses:   Narrative:  Start time: 01/07/2016 1:30 PM End time: 01/07/2016 1:40 PM Injection made incrementally with aspirations every 5 mL. Anesthesiologist: Alvin Critchley  Additional Notes: Time out called.  A roll was placed under the right side of the back.  The right neck was prepped and draped in sterile fashion.  Sterile lubrication was used on the probe and neck.  The US probe was guided to the C6 position and the bundle was visualized in the typical stoplight pattern.  A skin wheal was made lateral to the probe with 1% Lidocaine plain.  The 22 G stimuplex needle was inserted lateral to the probe and directed to the bundle. A twitch was obtained which faded at 0.55 mAmps.  Incremental injection of 30 cc of a mix of 0.2% Ropivacaine +  0.5% Ropivacine.  No pain on injection and the injection was smooth with no resistance.  The patient tolerated the procedure well.  Thick, tough skin necessitated a few attempted tries to pierce the skin.

## 2016-01-07 NOTE — Anesthesia Preprocedure Evaluation (Addendum)
Anesthesia Evaluation  Patient identified by MRN, date of birth, ID band Patient awake    Reviewed: Allergy & Precautions, H&P , NPO status , Patient's Chart, lab work & pertinent test results, reviewed documented beta blocker date and time   History of Anesthesia Complications Negative for: history of anesthetic complications  Airway Mallampati: II  TM Distance: >3 FB Neck ROM: full    Dental no notable dental hx.    Pulmonary neg shortness of breath, neg sleep apnea, neg COPD, neg recent URI, former smoker,    Pulmonary exam normal breath sounds clear to auscultation       Cardiovascular Exercise Tolerance: Good hypertension, On Medications + angina + CAD, + Past MI, + Peripheral Vascular Disease and +CHF  (-) Cardiac Stents and (-) CABG Normal cardiovascular exam+ Valvular Problems/Murmurs (Mitral stenosis)  Rhythm:regular Rate:Normal     Neuro/Psych  Headaches, PSYCHIATRIC DISORDERS Anxiety Depression Bipolar Disorder  Neuromuscular disease CVA, Residual Symptoms    GI/Hepatic Neg liver ROS, PUD, GERD  Medicated,  Endo/Other  diabetes, Well Controlled, Type 2, Oral Hypoglycemic AgentsHypothyroidism   Renal/GU Renal InsufficiencyRenal disease  negative genitourinary   Musculoskeletal  (+) Arthritis , Osteoarthritis,    Abdominal   Peds negative pediatric ROS (+)  Hematology negative hematology ROS (+) anemia ,   Anesthesia Other Findings Past Medical History:   Colon polyp                                                  GERD (gastroesophageal reflux disease)                       History of left heart catheterization (LHC)                    Comment:a. cath 08/09/2003: mild LAD plaque, no obs dzs               noted; b. 12/18/10 stress test w/o ischemia.    CVA (cerebral vascular accident)                               Comment:a. 06/2008-TEE nl LV fxn; b. 01/2013 - notes               indicate short run of SVT  at that time   Multiple sclerosis                              2015         Syncope                                                      Chronic systolic CHF (congestive heart failure)                Comment:a. echo 01/29/2013 EF 40-45%, basal               anteroseptal & mid anteroseptal segments abnl;               b echo 05/2012 EF 50-55%, mildly dilated LA, nl  RVSP   Orthostatic hypotension                                      Aortic arch aneurysm                                         Cardiomyopathy                                               MS (mitral stenosis)                                         Malignant melanoma of skin of scalp                          Hypertension                                                 Depression                                                   Diabetes mellitus without complication                       Headache                                                       Comment:Migrains   Hyperlipidemia                                               Renal disease                                                Renal disease                                                Hypothyroidism                                               Anxiety  Reproductive/Obstetrics negative OB ROS                             Anesthesia Physical  Anesthesia Plan  ASA: IV  Anesthesia Plan: General   Post-op Pain Management:  Regional for Post-op pain   Induction:   Airway Management Planned: Oral ETT  Additional Equipment:   Intra-op Plan:   Post-operative Plan: Extubation in OR  Informed Consent: I have reviewed the patients History and Physical, chart, labs and discussed the procedure including the risks, benefits and alternatives for the proposed anesthesia with the patient or authorized representative who has indicated his/her understanding and acceptance.    Dental Advisory Given  Plan Discussed with: Anesthesiologist, CRNA and Surgeon  Anesthesia Plan Comments: (Surgeon requested Interscalene block for post-op pain control.  The patient was advised of all the risks and insisted on the block.)       Anesthesia Quick Evaluation

## 2016-01-07 NOTE — Transfer of Care (Signed)
Immediate Anesthesia Transfer of Care Note  Patient: [name]  Procedure(s) Performed: Procedure(s): SHOULDER ARTHROSCOPY WITH DEBRIDMENT, SUBACHROMIAL DECOMPRESSION (Right)  Patient Location: PACU  Anesthesia Type:General  Level of Consciousness: sedated  Airway & Oxygen Therapy: Patient Spontanous Breathing and Patient connected to face mask oxygen  Post-op Assessment: Report given to RN and Post -op Vital signs reviewed and stable  Post vital signs: Reviewed and stable  Last Vitals:  Vitals:   01/07/16 1355 01/07/16 1529  BP: (!) 160/83 (!) 177/82  Pulse: 69 (!) 103  Resp: 14 17  Temp:  36.3 C    Last Pain:  Vitals:   01/07/16 1101  TempSrc: Oral  PainSc: 8       Patients Stated Pain Goal: 2 (97/02/63 [id number])  Complications: No apparent anesthesia complications

## 2016-01-07 NOTE — Anesthesia Postprocedure Evaluation (Signed)
Anesthesia Post Note  Patient: [name]  Procedure(s) Performed: Procedure(s) (LRB): SHOULDER ARTHROSCOPY WITH DEBRIDMENT, SUBACHROMIAL DECOMPRESSION (Right)  Patient location during evaluation: PACU Anesthesia Type: General Level of consciousness: awake and alert and oriented Pain management: pain level controlled Vital Signs Assessment: post-procedure vital signs reviewed and stable Respiratory status: spontaneous breathing Cardiovascular status: blood pressure returned to baseline Anesthetic complications: no    Last Vitals:  Vitals:   01/07/16 1529 01/07/16 1544  BP: (!) 177/82 (!) 157/73  Pulse: (!) 103 83  Resp: 17 14  Temp: 36.3 C     Last Pain:  Vitals:   01/07/16 1529  TempSrc:   PainSc: Asleep                 Finnlee Guarnieri

## 2016-01-08 ENCOUNTER — Encounter: Payer: Self-pay | Admitting: Surgery

## 2016-01-14 ENCOUNTER — Other Ambulatory Visit: Payer: Self-pay | Admitting: *Deleted

## 2016-01-14 MED ORDER — LOSARTAN POTASSIUM 50 MG PO TABS: 50.0000 mg | ORAL_TABLET | Freq: Every day | ORAL | 12 refills | Status: DC

## 2016-01-30 ENCOUNTER — Other Ambulatory Visit: Payer: Self-pay

## 2016-01-30 ENCOUNTER — Telehealth: Payer: Self-pay

## 2016-01-30 LAB — RENAL FUNCTION PANEL
Albumin: 4.1 g/dL (ref 3.5–5.5)
BUN / CREAT RATIO: 15 (ref 9–23)
BUN: 22 mg/dL (ref 6–24)
CO2: 25 mmol/L (ref 18–29)
CREATININE: 1.49 mg/dL — AB (ref 0.57–1.00)
Calcium: 9.6 mg/dL (ref 8.7–10.2)
Chloride: 103 mmol/L (ref 96–106)
GFR, EST AFRICAN AMERICAN: 48 mL/min/{1.73_m2} — AB (ref >59)
GFR, EST NON AFRICAN AMERICAN: 42 mL/min/{1.73_m2} — AB (ref >59)
GLUCOSE: 98 mg/dL (ref 65–99)
POTASSIUM: 4.2 mmol/L (ref 3.5–5.2)
Phosphorus: 3.6 mg/dL (ref 2.5–4.5)
SODIUM: 146 mmol/L — AB (ref 134–144)

## 2016-01-30 LAB — MAGNESIUM: MAGNESIUM: 1.7 mg/dL (ref 1.6–2.3)

## 2016-01-30 MED ORDER — DILTIAZEM HCL ER 120 MG PO CP12: 120.0000 mg | ORAL_CAPSULE | Freq: Every day | ORAL | 3 refills | Status: DC

## 2016-01-30 NOTE — Telephone Encounter (Signed)
Patient advised as below. Patient verbalizes understanding and is in agreement with treatment plan.  

## 2016-01-30 NOTE — Telephone Encounter (Signed)
-----   Message from [name], MD sent at 01/30/2016  7:53 AM EST ----- Kidney functions are much better, back to her baseline. Magnesium levels are back to normal. Continue current medications.  Follow up in march as scheduled.

## 2016-01-31 ENCOUNTER — Other Ambulatory Visit: Payer: Self-pay | Admitting: Cardiovascular Disease

## 2016-01-31 MED ORDER — DILTIAZEM HCL ER COATED BEADS 120 MG PO CP24: 120.0000 mg | ORAL_CAPSULE | Freq: Every day | ORAL | 3 refills | Status: DC

## 2016-02-26 ENCOUNTER — Encounter: Payer: Self-pay | Admitting: Cardiovascular Disease

## 2016-02-26 ENCOUNTER — Telehealth: Payer: Self-pay | Admitting: Cardiovascular Disease

## 2016-02-26 NOTE — Telephone Encounter (Signed)
Pt calling stating she's been having more SOB and feels like she is having tachycardia  She has been having this for about 2 weeks She states right now she is having some Pressure on left side and sob   Pt c/o Shortness Of Breath: STAT if SOB developed within the last 24 hours or pt is noticeably SOB on the phone  1. Are you currently SOB (can you hear that pt is SOB on the phone)? No   2. How long have you been experiencing SOB? About 2 weeks   3. Are you SOB when sitting or when up moving around? Both, right now she is driving and is SOB   4. Are you currently experiencing any other symptoms? Just feels like her heart is about to come out of her chest.  Please advise.

## 2016-02-26 NOTE — Telephone Encounter (Signed)
Spoke w/ pt.  She reports SOB and HR 110-120s. Advised her that I spoke w/ Dr. [name] and he recommends she utilize stress reducing techniques and keep appt tomorrow.   She reports that she does not have anti-anxiety meds, only Celexa for her depression.  She typically goes home to lie down when she has these episodes. She is home now and will go rest. Advised her to keep appt tomorrow, added her to wait list to call in the event of a cancellation this afternoon. She is appreciative of the call.

## 2016-02-27 ENCOUNTER — Encounter: Payer: Self-pay | Admitting: Cardiovascular Disease

## 2016-02-27 ENCOUNTER — Ambulatory Visit (INDEPENDENT_AMBULATORY_CARE_PROVIDER_SITE_OTHER): Payer: BC Managed Care – PPO | Admitting: Cardiovascular Disease

## 2016-02-27 VITALS — BP 140/100 | HR 73 | Ht 63.0 in | Wt 168.5 lb

## 2016-02-27 DIAGNOSIS — I25110 Atherosclerotic heart disease of native coronary artery with unstable angina pectoris: Secondary | ICD-10-CM

## 2016-02-27 DIAGNOSIS — E7800 Pure hypercholesterolemia, unspecified: Secondary | ICD-10-CM | POA: Diagnosis not present

## 2016-02-27 DIAGNOSIS — N183 Chronic kidney disease, stage 3 unspecified: Secondary | ICD-10-CM

## 2016-02-27 DIAGNOSIS — I10 Essential (primary) hypertension: Secondary | ICD-10-CM

## 2016-02-27 DIAGNOSIS — IMO0002 Reserved for concepts with insufficient information to code with codable children: Secondary | ICD-10-CM

## 2016-02-27 DIAGNOSIS — E1159 Type 2 diabetes mellitus with other circulatory complications: Secondary | ICD-10-CM | POA: Diagnosis not present

## 2016-02-27 DIAGNOSIS — R000 Tachycardia, unspecified: Secondary | ICD-10-CM

## 2016-02-27 DIAGNOSIS — E1165 Type 2 diabetes mellitus with hyperglycemia: Secondary | ICD-10-CM

## 2016-02-27 DIAGNOSIS — I639 Cerebral infarction, unspecified: Secondary | ICD-10-CM

## 2016-02-27 MED ORDER — CLOPIDOGREL BISULFATE 75 MG PO TABS: 75.0000 mg | ORAL_TABLET | Freq: Every day | ORAL | 11 refills | Status: DC

## 2016-02-27 MED ORDER — METOPROLOL SUCCINATE ER 25 MG PO TB24: 25.0000 mg | ORAL_TABLET | Freq: Every day | ORAL | 11 refills | Status: DC

## 2016-02-27 NOTE — Progress Notes (Signed)
Cardiology Office Note  Date:  02/27/2016   ID:  [address], [place] February 09, 1970, MRN [id number]  PCP:  [name], MD   Chief Complaint  Patient presents with  . OTHER    C/o chest pain and elevated HR. Meds reviewed verbally with pt.    HPI:  46 year old woman with a long hx of smoking, numerous prior strokes,  history of chest pain dating back to 2005 at that time with cardiac catheterization on August 09, 2003 found to have mild plaque in the LAD, no obstructive disease noted.    CVA in May 2010,  normal LV function, s/p gastric bypass surgery, Who presents for routine followup of her coronary artery disease Baseline creatinine 1.5, previously taken off her diuretic Catheterization May 2017 with stent placed to her first diagonal, there was residual 50% distal LAD disease, 30% proximal, 40% proximal circumflex  In follow-up today she reports that she is having Gi issues, trouble swallowing She's had symptoms for several years, perhaps worse recently Will eat only once a day Hx of gastric bypass  Anticoagulation/Plavix held for surgery right shoulder 01/07/2016   We did receive phone calls yesterday that blood pressure and heart rate were elevated She reported having tachycardia, heart rate up to 140 bpm Tachycardia "comes and goes" Previous event monitor showing sinus tachycardia up to 160 Yesterday the symptoms lasted for 10 min, then heart rate improved down to 112 bpm About daily, 2 to 3 times a day Worse with exertion, cleaning  On today's visit, and eyes having significant chest pain Cardiac catheterization   results reviewed with her from  March 2017showing moderate LAD disease, 70% diagonal disease small vessel, near the ostium,  Decision was made to treat this medically  Reports having some periodic ankle swelling  EKG on today's visit shows normal sinus rhythm with rate 73 bpm, nonspecific ST and T wave abnormality anterolateral leads, inferior leads   other  past medical history reviewed Recent carotid ultrasound reviewed with her showing minimal bilateral carotid disease Recent echocardiogram reviewed with her showing essentially normal ejection fraction  She is status post left carotid to subclavian artery bypass with subclavian artery ligation, done at [facility] by vascular surgery admission to [facility] from 10/5 to 11/13/14 with unstable angina and was found to have severe mid LAD lesion at 85-90% stenosis on diagnositc cardiac cath s/p PCI/DES on staged procedure.   CT in October 2015 showing Right aortic arch with a large retro- esophageal vascular segment, persistent dorsal aortic segment) giving off aberrant left subclavian artery. This large caliber retroaortic segment measuring 18 mm compared to the ascending aorta which measures 22 mm. This large retroesophageal aortic segment along with the partially calcified atretic ductus ligament between the left aberrant subclavian artery and the left pulmonary artery creates an ALMOSTcomplete vascular ring. This is likelythe cause ofpatient's dysphagia [place].  Previously was taking midodrine or Florinef for hypotension following her stroke.  She previously presented to the hospital December 18, 2010 with chest pain.   stress test in the hospital showed no ischemia. Cardiac enzymes were negative. No EKG changes noted.   admission to the hospital 11/01/2012 with discharge 11/02/2012 with chest pain. She ruled out with negative cardiac enzymes. Blood pressures were in the 622-633 systolic range. Discharge blood pressure medications included amlodipine 10 mg daily, benazepril 40 mg daily, clonidine 0.2 mg., Coreg 3.125 mg twice a day  stroke and admission to the hospital at the end of December 2014. She had  acute onset left sided weakness including arm and leg. She reports that review of MRI showed acute stroke, as well as previous strokes. She was seen by neurology and aspirin changed to  Plavix 75 mg daily. Hemoglobin A1c 8.5. Notes indicate short run of SVT while she was in the hospital and on the monitor.  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[place]); Arthritis; Bipolar disorder ([place]); CAD (coronary artery disease); Chronic systolic CHF (congestive heart failure) ([place]); CKD (chronic kidney disease), stage IV ([place]); Colon polyp; CVA (cerebral vascular accident) ([place]); Depression; Diabetes mellitus without complication ([place]); Gastric ulcer (04/27/2011); GERD (gastroesophageal reflux disease); Headache; Hyperlipidemia; Hypertension; Hypertensive heart disease; Hypothyroidism; Ischemic cardiomyopathy; Malignant melanoma of skin of scalp ([place]); Morbid obesity ([place]); Multiple sclerosis ([place]) (2015); Neuromuscular disorder ([place]); Neuropathy ([place]); Orthostatic hypotension; and Syncope.  PSH:    Past Surgical History:  Procedure Laterality Date  . APPENDECTOMY    . CARDIAC CATHETERIZATION N/A 11/09/2014   Procedure: Coronary Angiography;  Surgeon: Minna Merritts, MD;  Location: [place] [facility];  Service: Cardiovascular;  Laterality: N/A;  . CARDIAC CATHETERIZATION N/A 11/12/2014   Procedure: Coronary Stent Intervention;  Surgeon: Isaias Cowman, MD;  Location: [place] [facility];  Service: Cardiovascular;  Laterality: N/A;  . CARDIAC CATHETERIZATION N/A 04/18/2015   Procedure: Left Heart Cath and Coronary Angiography;  Surgeon: Minna Merritts, MD;  Location: [place] [facility];  Service: Cardiovascular;  Laterality: N/A;  . CARDIAC CATHETERIZATION Left 06/04/2015   Procedure: Left Heart Cath and Coronary Angiography;  Surgeon: Wellington Hampshire, MD;  Location: [place] [facility];  Service: Cardiovascular;  Laterality: Left;  . CARDIAC CATHETERIZATION N/A 06/04/2015   Procedure: Coronary Stent Intervention;  Surgeon: Wellington Hampshire, MD;  Location: [place] [facility];  Service: Cardiovascular;  Laterality: N/A;  . CESAREAN SECTION  2001  . CORONARY ANGIOPLASTY    . DILATION AND CURETTAGE OF UTERUS    . ESOPHAGOGASTRODUODENOSCOPY (EGD) WITH PROPOFOL N/A 09/14/2014   Procedure: ESOPHAGOGASTRODUODENOSCOPY (EGD) WITH PROPOFOL;  Surgeon: Josefine Class, MD;  Location: [facility] ENDOSCOPY;  Service: Endoscopy;  Laterality: N/A;  . GASTRIC BYPASS  09/2009   [facility]   . Left Carotid to sublcavian artery bypass w/ subclavian artery ligation     a. Performed @ [place].  . MELANOMA EXCISION  2016   Dr. Evorn Gong  . NOVASURE ABLATION    . RIGHT OOPHORECTOMY    . SHOULDER ARTHROSCOPY WITH OPEN ROTATOR CUFF REPAIR Right 01/07/2016   Procedure: SHOULDER ARTHROSCOPY WITH DEBRIDMENT, SUBACHROMIAL DECOMPRESSION;  Surgeon: Corky Mull, MD;  Location: [facility] ORS;  Service: Orthopedics;  Laterality: Right;  . TRIGGER FINGER RELEASE Right     [place]  . TUBAL LIGATION     R tube removed still w/ Left    Current Outpatient Prescriptions  Medication Sig Dispense Refill  . aspirin EC 81 MG tablet Take 1 tablet (81 mg total) by mouth daily.    . citalopram (CELEXA) 40 MG tablet Take 1 tablet (40 mg total) by mouth daily. 30 tablet  5  . diltiazem (CARDIZEM CD) 120 MG 24 hr capsule Take 1 capsule (120 mg total) by mouth daily. 90 capsule 3  . eszopiclone (LUNESTA) 2 MG TABS tablet Take 1 tablet (2 mg total) by mouth at bedtime as needed for sleep. Take immediately before bedtime 30 tablet 0  . gabapentin (NEURONTIN) 300 MG capsule Take 300 mg by  mouth at bedtime. 600 mg in the morning    . isosorbide mononitrate (IMDUR) 60 MG 24 hr tablet Take 1 tablet (60 mg total) by mouth daily. 30 tablet 6  . levothyroxine (SYNTHROID, LEVOTHROID) 25 MCG tablet Take 1 tablet (25 mcg total) by mouth daily before breakfast. 30 tablet 5  . losartan (COZAAR) 50 MG tablet Take 1 tablet (50 mg total) by mouth daily. 30 tablet 12  . Magnesium 500 MG TABS Take 500 mg by mouth daily.    . metFORMIN (GLUCOPHAGE) 1000 MG tablet Take 1,000 mg by mouth 2 (two) times daily with a meal.     . nitroGLYCERIN (NITROSTAT) 0.4 MG SL tablet Place 1 tablet (0.4 mg total) under the tongue every 5 (five) minutes as needed for chest pain. 25 tablet 6  . OVER THE COUNTER MEDICATION Take 2 tablets by mouth daily. Hair, Skin and Nails Gummies    . OVER THE COUNTER MEDICATION Take 2 tablets by mouth daily. B-12 gummies    . pantoprazole (PROTONIX) 40 MG tablet Take 1 tablet (40 mg total) by mouth 2 (two) times daily. 60 tablet 5  . promethazine (PHENERGAN) 25 MG tablet Take 1 tablet (25 mg total) by mouth every 6 (six) hours as needed for nausea or vomiting. Reported on 05/31/2015 30 tablet 3  . rosuvastatin (CRESTOR) 40 MG tablet Take 1 tablet (40 mg total) by mouth daily at 6 PM. 90 tablet 3  . traZODone (DESYREL) 100 MG tablet Take 2 tablets (200 mg total) by mouth at bedtime. 30 tablet 6  . metoprolol succinate (TOPROL-XL) 25 MG 24 hr tablet Take 1 tablet (25 mg total) by mouth daily. Take with or immediately following a meal. 30 tablet 11   No current facility-administered medications for this visit.      Allergies:   Patient has no known allergies.   Social History:  The patient  reports that she quit smoking about 21 years ago. Her smoking use included Cigarettes. She has never used smokeless tobacco. She reports that she does not drink alcohol or use drugs.   Family History:   family history includes Anxiety disorder in her father and mother; Bipolar disorder in her  mother; Breast cancer in her maternal aunt and maternal aunt; Colon cancer in her father; Depression in her father, mother, and sister; Diabetes in her father and sister; Heart disease in her father and mother; Hyperlipidemia in her mother and sister; Hypertension in her father, mother, sister, and sister; Kidney disease in her father and sister; Skin cancer in her father; Stroke in her father; Thyroid nodules in her sister.    Review of Systems: Review of Systems  Constitutional: Negative.   Respiratory: Negative.   Cardiovascular: Positive for chest pain and palpitations.       Tachycardia  Gastrointestinal: Negative.   Musculoskeletal: Negative.   Neurological: Negative.   Psychiatric/Behavioral: Negative.   All other systems reviewed and are negative.    PHYSICAL EXAM: VS:  BP (!) 140/100 (BP Location: Left Arm, Patient Position: Sitting, Cuff Size: Normal)   Pulse 73   Ht 5\' 3"  (1.6 m)  Wt 168 lb 8 oz (76.4 kg)   BMI 29.85 kg/m  , BMI Body mass index is 29.85 kg/m. GEN: Well nourished, well developed, in no acute distress  HEENT: normal  Neck: no JVD, carotid bruits, or masses Cardiac: RRR; no murmurs, rubs, or gallops,no edema  Respiratory:  clear to auscultation bilaterally, normal work of breathing GI: soft, nontender, nondistended, + BS MS: no deformity or atrophy  Skin: warm and dry, no rash Neuro:  Strength and sensation are intact Psych: euthymic mood, full affect    Recent Labs: 12/13/2015: ALT 16 12/14/2015: TSH 3.168 01/02/2016: Hemoglobin 12.2; Platelets 231 01/29/2016: BUN 22; Creatinine, Ser 1.49; Magnesium 1.7; Potassium 4.2; Sodium 146    Lipid Panel Lab Results  Component Value Date   CHOL 164 11/09/2014   HDL 27 (L) 11/09/2014   LDLCALC 85 11/09/2014   TRIG 259 (H) 11/09/2014      Wt Readings from Last 3 Encounters:  02/27/16 168 lb 8 oz (76.4 kg)  01/02/16 170 lb (77.1 kg)  12/25/15 170 lb (77.1 kg)       ASSESSMENT AND  PLAN:  Coronary artery disease involving native coronary artery of native heart with unstable angina pectoris ([place]) - Plan: EKG 12-Lead Currently with no symptoms of angina. No further workup at this time. Continue current medication regimen.we would recommend that she restart her Plavix. This was held prematurely for right shoulder surgery. Prior stent May 2017  Pure hypercholesterolemia Recommended she stay on her Crestor 40 mg daily Goal LDL less than 70  Uncontrolled type 2 diabetes mellitus with other circulatory complication, without long-term current use of insulin ([place]) We have encouraged continued exercise, careful diet management in an effort to lose weight. She reports that she has been losing weight recently  Essential hypertension Blood pressure is well controlled on today's visit.  For her episodes of tachycardia we will add metoprolol succinate 25 mg daily  Cerebrovascular accident (CVA), unspecified mechanism ([place])  Chronic kidney disease (CKD), stage III (moderate) Followed by nephrology, creatinine down to 1.5 Recommend we avoid Lasix and NSAIDs  Sinus tachycardia Prior event monitor showing sinus tachycardia We will add Toprol succinate 25 mg daily, slowly increase the dose for symptom relief Previously was on Coreg in the past. This was held in the hospital for unclear reasons    Disposition:   F/U  6 months   Orders Placed This Encounter  Procedures  . EKG 12-Lead     Signed, Esmond Plants, M.D., Ph.D. 02/27/2016  [place], [place]

## 2016-02-27 NOTE — Patient Instructions (Addendum)
Medication Instructions:   Please start metoprolol succinate one a day for tachycardia  Restart Plavix 75 mg daily  Labwork:  No new labs needed  Testing/Procedures:  No further testing at this time   I recommend watching educational videos on topics of interest to you at:       [email or web address]  Enter code: HEARTCARE    Follow-Up: It was a pleasure seeing you in the office today. Please call us if you have new issues that need to be addressed before your next appt.  [phone]  Your physician wants you to follow-up in: 6 months.  You will receive a reminder letter in the mail two months in advance. If you don't receive a letter, please call our office to schedule the follow-up appointment.  If you need a refill on your cardiac medications before your next appointment, please call your pharmacy.

## 2016-02-28 ENCOUNTER — Other Ambulatory Visit: Payer: Self-pay | Admitting: *Deleted

## 2016-02-28 MED ORDER — LEVOTHYROXINE SODIUM 25 MCG PO TABS: 25.0000 ug | ORAL_TABLET | Freq: Every day | ORAL | 12 refills | Status: DC

## 2016-02-28 NOTE — Progress Notes (Signed)
Sent pt MyChart message w/ Dr. Donivan Scull recommendation.

## 2016-02-29 ENCOUNTER — Other Ambulatory Visit: Payer: Self-pay | Admitting: Family Medicine

## 2016-02-29 DIAGNOSIS — G4700 Insomnia, unspecified: Secondary | ICD-10-CM

## 2016-03-01 NOTE — Telephone Encounter (Signed)
Please call in [place]

## 2016-03-02 NOTE — Telephone Encounter (Signed)
Rx called in to pharmacy. 

## 2016-03-08 IMAGING — CT CT ANGIO CHEST
2 of 6 series · 18 of 36 positions shown · IV contrast (APPLIED)
Comparison: 10/05/2013

CLINICAL DATA: Chest pain

EXAM:
CT ANGIOGRAPHY CHEST WITH CONTRAST
TECHNIQUE: Multidetector CT imaging of the chest was performed using the
standard protocol during bolus administration of intravenous
contrast. Multiplanar CT image reconstructions and MIPs were
obtained to evaluate the vascular anatomy.
CONTRAST:  60 cc Omnipaque 350

[Series 5: pe 1.0 thins · axial · 0.66mm/px · z∈[-174,+58]mm · 17 of 263 slices shown]
[im 15/263  lung]
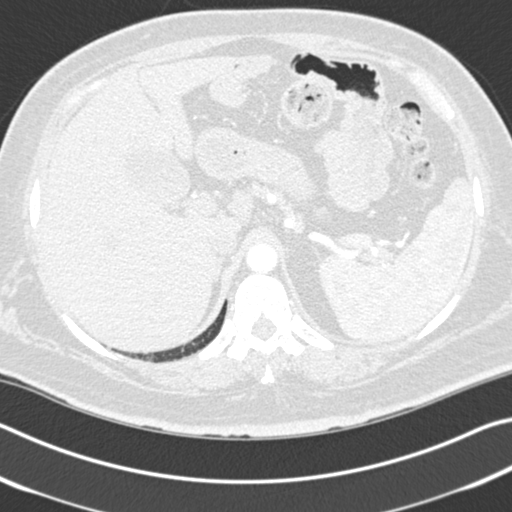
[im 30/263  mediastinal]
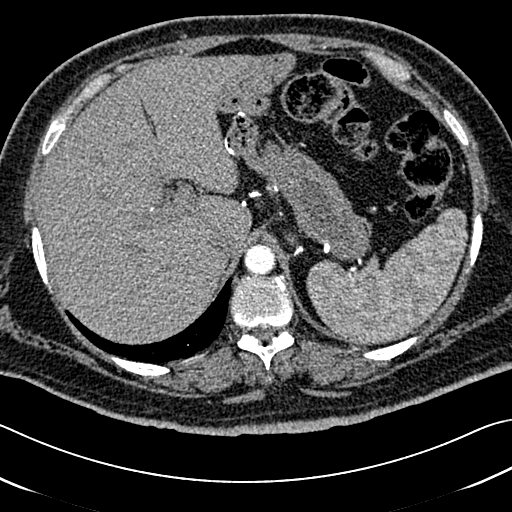
[im 44/263  lung]
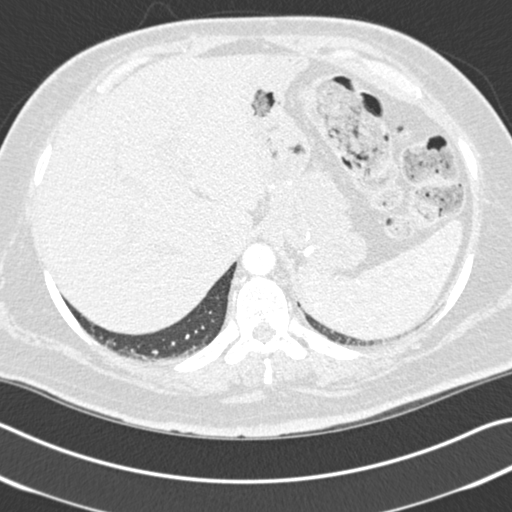
[im 59/263  mediastinal]
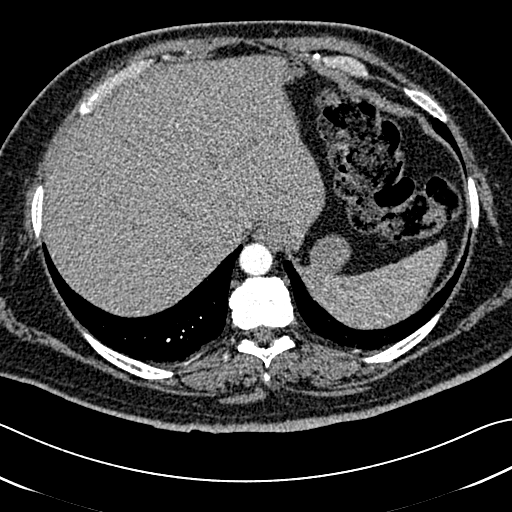
[im 73/263  lung]
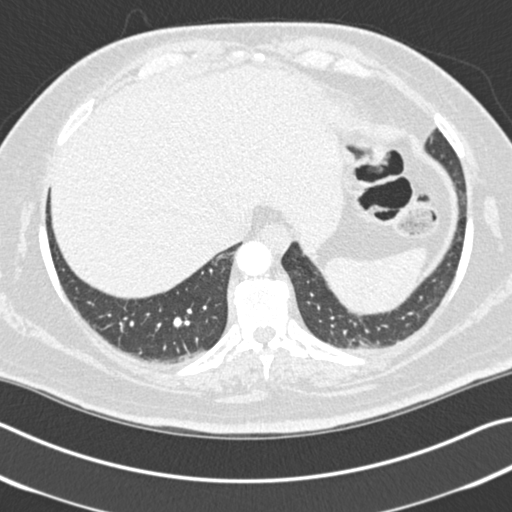
[im 88/263  mediastinal]
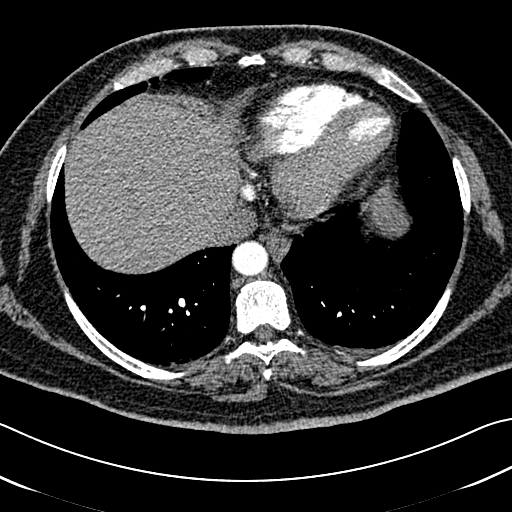
[im 102/263  lung]
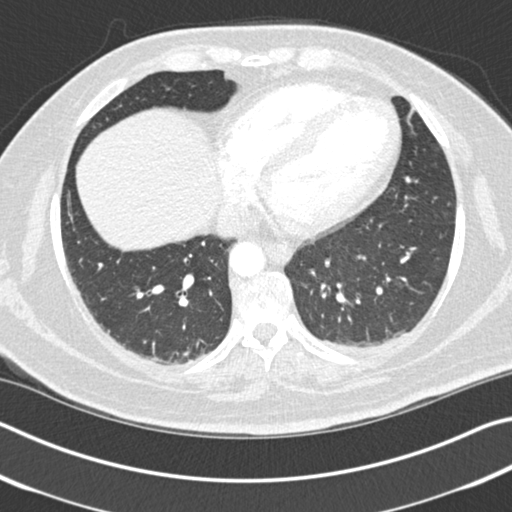
[im 117/263  mediastinal]
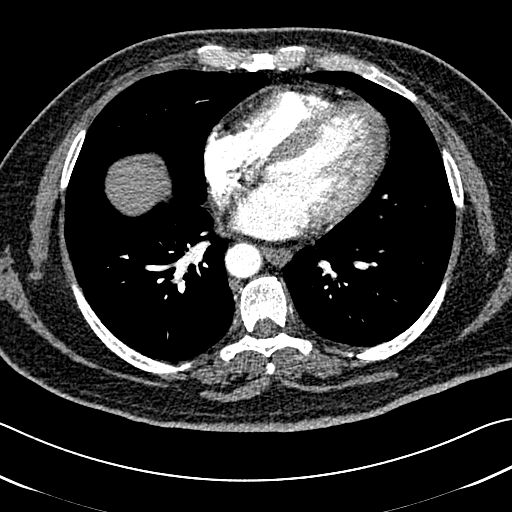
[im 132/263  lung]
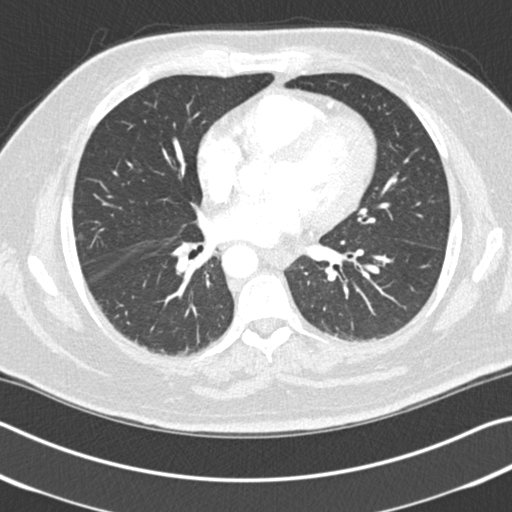
[im 146/263  mediastinal]
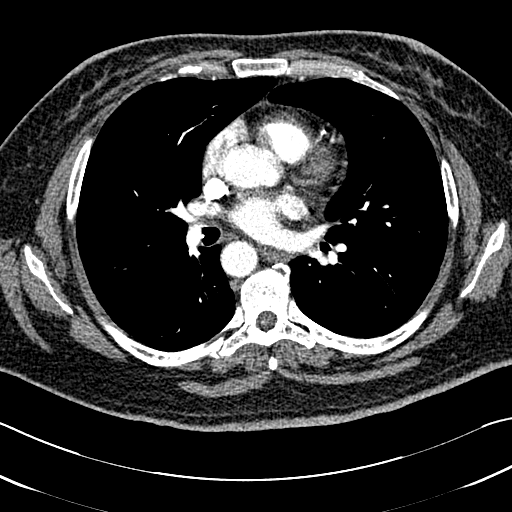
[im 161/263  lung]
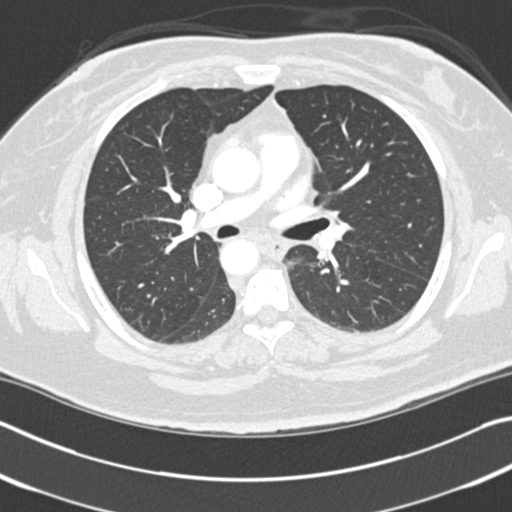
[im 175/263  mediastinal]
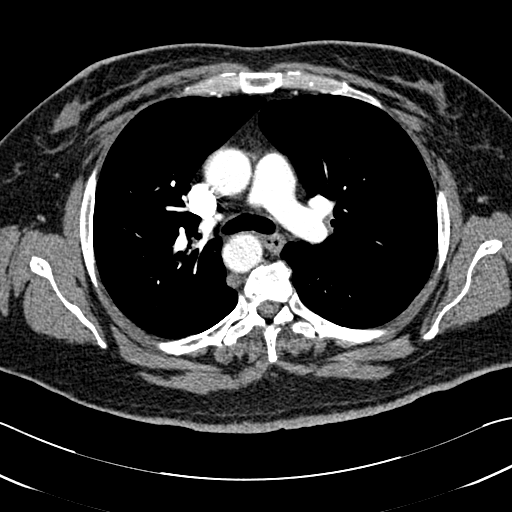
[im 190/263  lung]
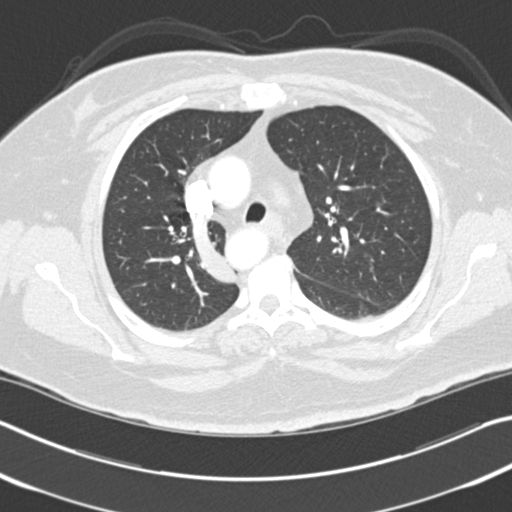
[im 204/263  mediastinal]
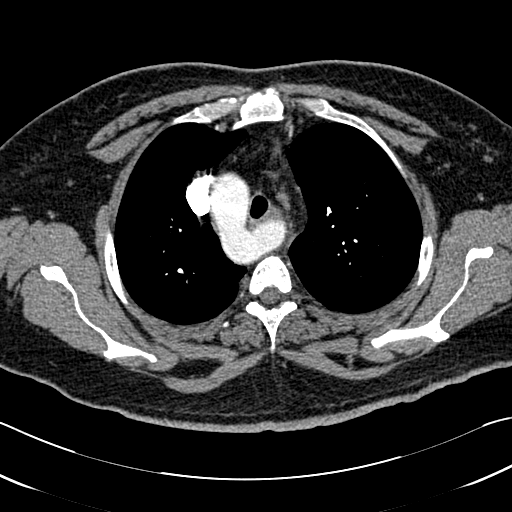
[im 219/263  lung]
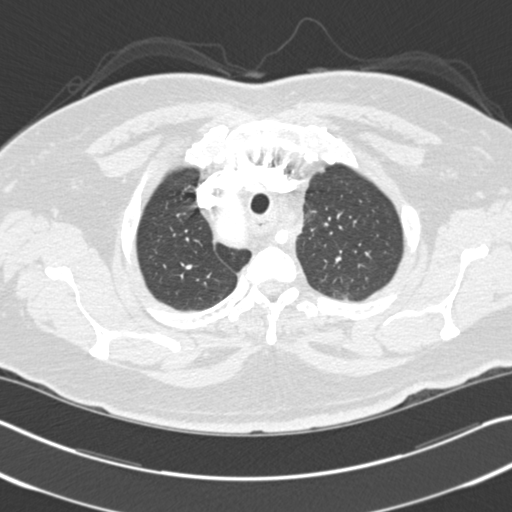
[im 233/263  mediastinal]
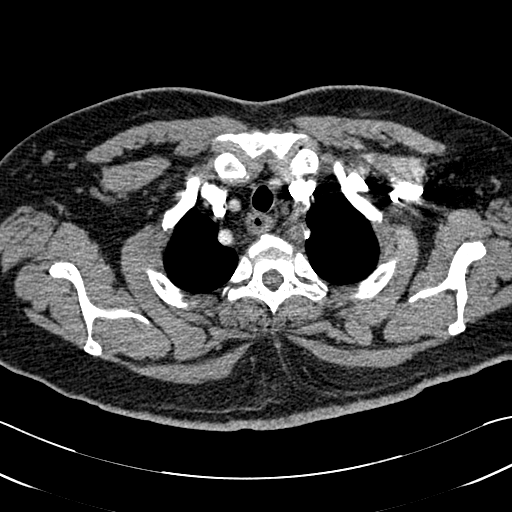
[im 248/263  lung]
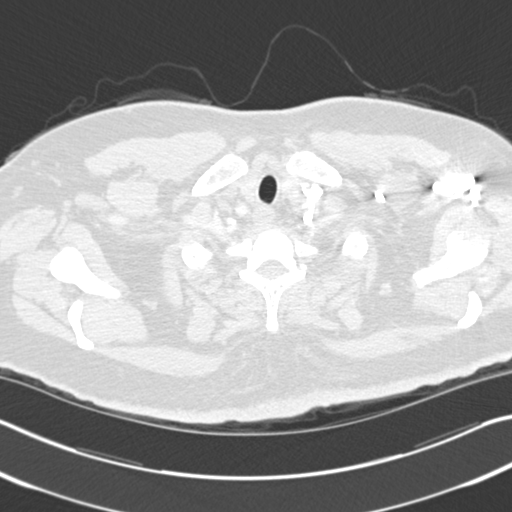

[Series 7: cor pe 2.0 mpr · coronal · 0.51mm/px · 1 of 131 slices shown]
[im 66/131  mediastinal]
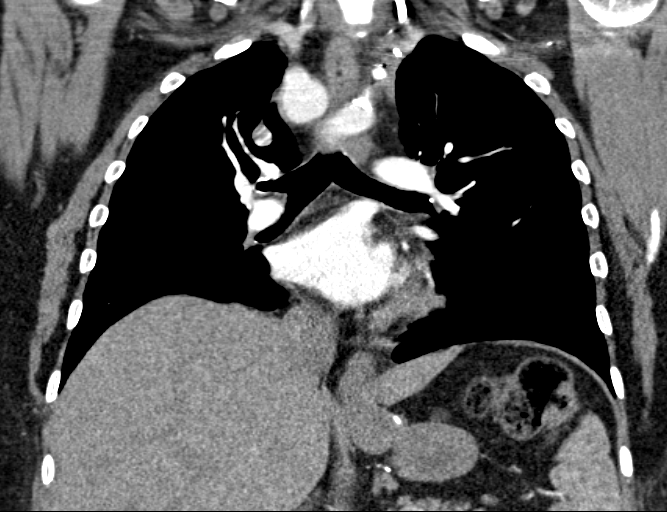

[18 of 36 positions shown; findings below may reference images not displayed]

FINDINGS: Right-sided aortic arch with aberrant left subclavian artery is
again noted. A metallic device has been placed within the proximal
left subclavian artery in the configuration of a waist. This portion
of the left subclavian artery is occluded. The right subclavian
artery, right common carotid artery are patent. The left common
carotid artery is poorly visualized but is grossly patent. There may
be a bypass extending from the left common carotid artery to the
left subclavian artery above the closure device.

The aberrant left subclavian artery exerts mass effect upon the
esophagus. Within this location, there is some wall thickening of
the esophagus. There is also stranding in the adjacent fat.

There are no filling defects in the pulmonary arterial tree to
suggest acute pulmonary thromboembolism.

No abnormal mediastinal adenopathy.

Minimal dependent atelectasis.  Lungs otherwise clear.

Postoperative changes from gastric bypass are suspected.

Review of the MIP images confirms the above findings.
IMPRESSION: The above findings are most likely due to closure of the aberrant
left subclavian artery and bypass from the left common carotid
artery to the left subclavian artery. This would also explain wall
thickening of the esophagus and stranding in the adjacent fat within
the mediastinum. Correlate clinically with patient's surgical
history.

No evidence of acute pulmonary thromboembolism.

## 2016-03-08 IMAGING — CR DG CHEST 2V
1 series · 2 of 2 positions shown · non-contrast
Comparison: 12/29/2013

CLINICAL DATA: Chest pain radiating to the back per 2 day. Short of
breath for 1 day. Cardiac catheterization 3 days ago.

EXAM:
CHEST  2 VIEW

[Series 1: dxr chest pa (or ap) and lateral · 0.14mm/px · 2 of 2 slices shown]
[im 1/2]
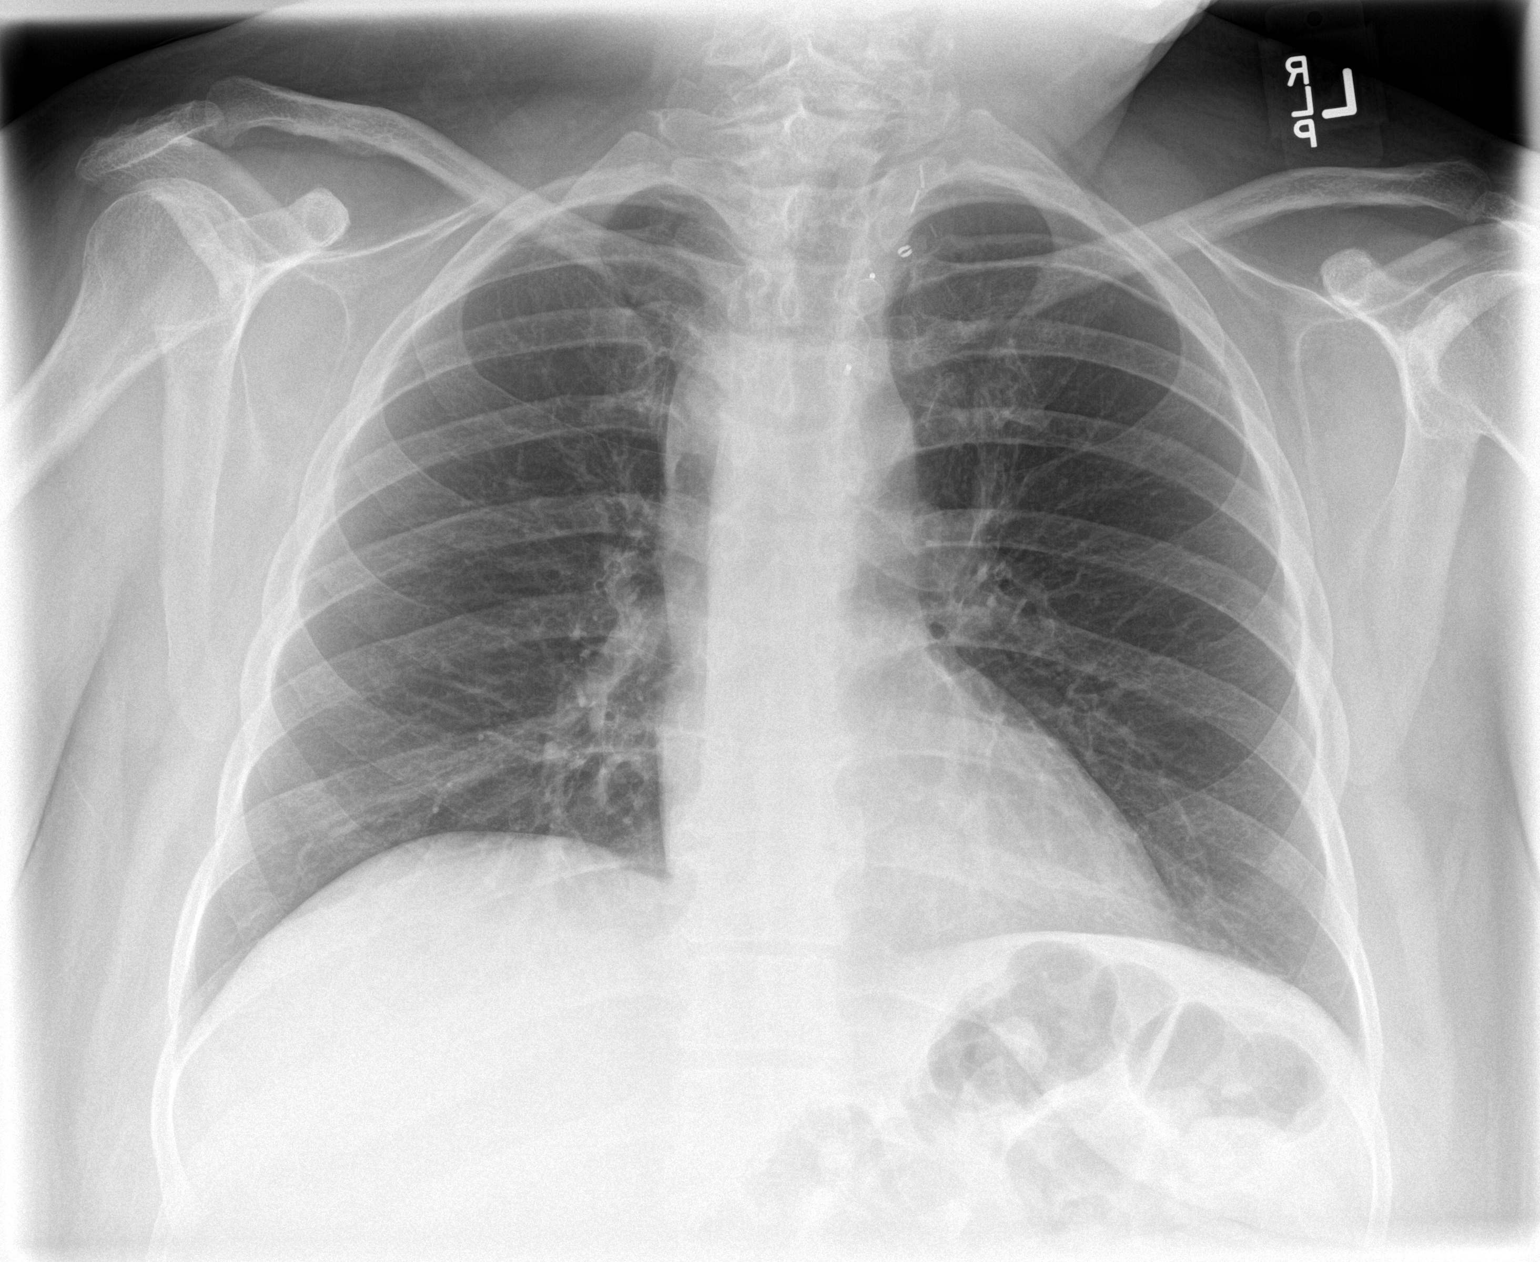
[im 2/2]
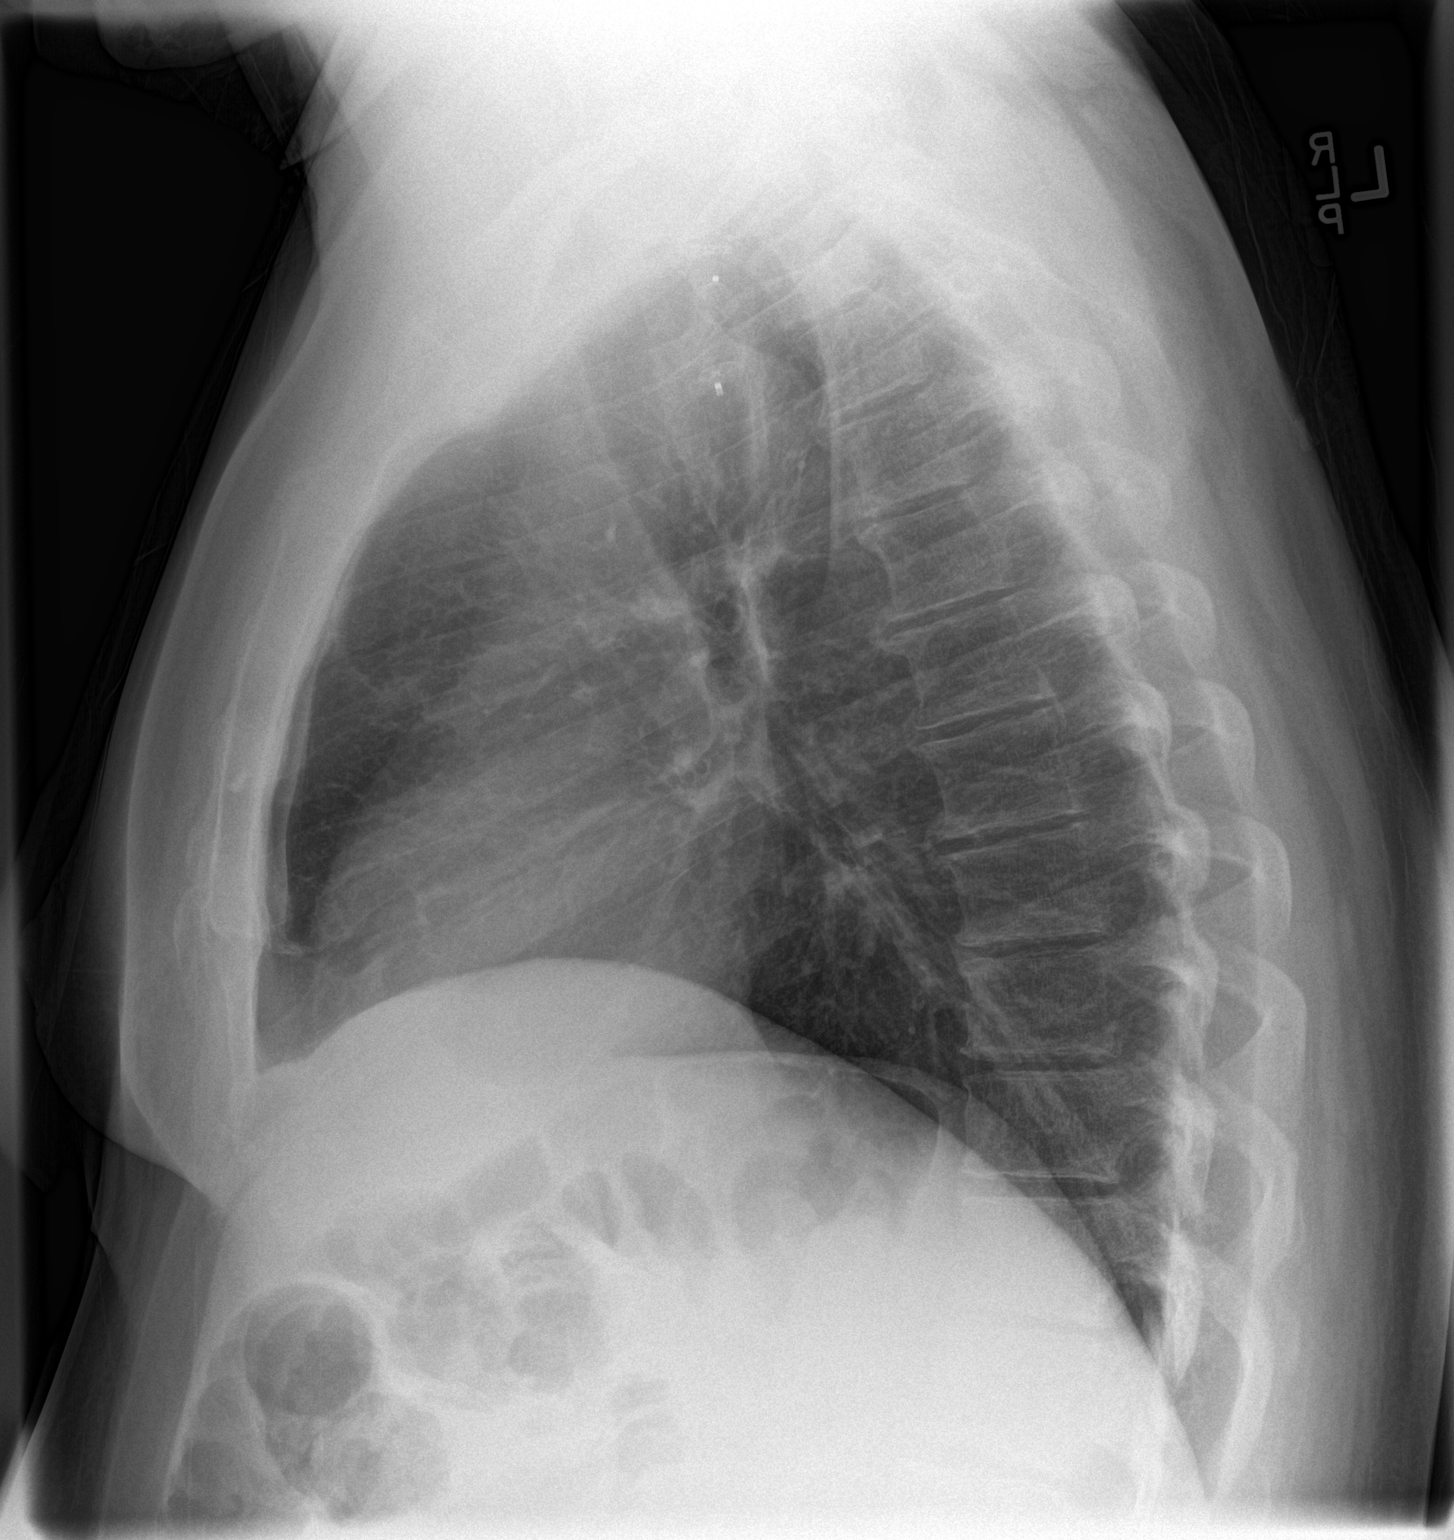

[2 of 2 positions shown; findings below may reference images not displayed]

FINDINGS: Normal heart size. Clear lung. Postoperative changes within the left
thoracic inlet. No pneumothorax or plural fluid.
IMPRESSION: No active cardiopulmonary disease.

## 2016-03-09 ENCOUNTER — Ambulatory Visit: Payer: Self-pay | Admitting: Urology

## 2016-03-16 ENCOUNTER — Ambulatory Visit (INDEPENDENT_AMBULATORY_CARE_PROVIDER_SITE_OTHER): Payer: BC Managed Care – PPO | Admitting: Family Medicine

## 2016-03-16 ENCOUNTER — Encounter: Payer: Self-pay | Admitting: Family Medicine

## 2016-03-16 VITALS — BP 182/102 | HR 85 | Temp 97.9°F | Resp 16 | Wt 166.0 lb

## 2016-03-16 DIAGNOSIS — N390 Urinary tract infection, site not specified: Secondary | ICD-10-CM

## 2016-03-16 DIAGNOSIS — R319 Hematuria, unspecified: Secondary | ICD-10-CM

## 2016-03-16 DIAGNOSIS — R350 Frequency of micturition: Secondary | ICD-10-CM

## 2016-03-16 DIAGNOSIS — R110 Nausea: Secondary | ICD-10-CM | POA: Diagnosis not present

## 2016-03-16 LAB — POCT URINALYSIS DIPSTICK
BILIRUBIN UA: NEGATIVE
Glucose, UA: NEGATIVE
KETONES UA: NEGATIVE
Nitrite, UA: POSITIVE
PH UA: 6
Urobilinogen, UA: 0.2

## 2016-03-16 MED ORDER — CEFTRIAXONE SODIUM 1 G IJ SOLR
1.0000 g | Freq: Once | INTRAMUSCULAR | Status: AC
  Administered 2016-03-16: 1 g via INTRAMUSCULAR

## 2016-03-16 MED ORDER — LEVOFLOXACIN 500 MG PO TABS: 500.0000 mg | ORAL_TABLET | Freq: Every day | ORAL | 0 refills | Status: AC

## 2016-03-16 MED ORDER — PROMETHAZINE HCL 25 MG/ML IJ SOLN
25.0000 mg | Freq: Once | INTRAMUSCULAR | Status: AC
  Administered 2016-03-16: 25 mg via INTRAMUSCULAR

## 2016-03-16 NOTE — Progress Notes (Signed)
Patient: [name] Female    DOB: Sep 03, 1970   46 y.o.   MRN: [id number] Visit Date: 03/16/2016  Today's Provider: [name], MD   Chief Complaint  Patient presents with  . Nausea    x 1 month   Subjective:    HPI Nausea:  Patient comes in reporting that she has had constant nausea for the past month. Nausea worsens whenever she eats or drinks. Symptoms have worsened since the onset. Nothing makes her nausea better. Patient has not tried any treatment to help with symptoms. Patient also has some mild abdominal pain, burning during urination, frequent urination, and dark colored urine.  States she not taking her chronic medications for the last 3 days due to nausea, but has not had any vomiting. She does have promethazine at home, but it hasn't helped much.     No Known Allergies   Current Outpatient Prescriptions:  .  aspirin EC 81 MG tablet, Take 1 tablet (81 mg total) by mouth daily., Disp: , Rfl:  .  citalopram (CELEXA) 40 MG tablet, Take 1 tablet (40 mg total) by mouth daily., Disp: 30 tablet, Rfl: 5 .  clopidogrel (PLAVIX) 75 MG tablet, Take 1 tablet (75 mg total) by mouth daily., Disp: 30 tablet, Rfl: 11 .  diltiazem (CARDIZEM CD) 120 MG 24 hr capsule, Take 1 capsule (120 mg total) by mouth daily., Disp: 90 capsule, Rfl: 3 .  eszopiclone (LUNESTA) 2 MG TABS tablet, TAKE 1 TABLET BY MOUTH AT BEDTIME AS NEEDED FOR SLEEP, Disp: 30 tablet, Rfl: 5 .  gabapentin (NEURONTIN) 300 MG capsule, Take 300 mg by mouth at bedtime. 600 mg in the morning, Disp: , Rfl:  .  isosorbide mononitrate (IMDUR) 60 MG 24 hr tablet, Take 1 tablet (60 mg total) by mouth daily., Disp: 30 tablet, Rfl: 6 .  levothyroxine (SYNTHROID, LEVOTHROID) 25 MCG tablet, Take 1 tablet (25 mcg total) by mouth daily before breakfast., Disp: 30 tablet, Rfl: 12 .  losartan (COZAAR) 50 MG tablet, Take 1 tablet (50 mg total) by mouth daily., Disp: 30 tablet, Rfl: 12 .  Magnesium 500 MG TABS, Take 500 mg by  mouth daily., Disp: , Rfl:  .  metFORMIN (GLUCOPHAGE) 1000 MG tablet, Take 1,000 mg by mouth 2 (two) times daily with a meal. , Disp: , Rfl:  .  metoprolol succinate (TOPROL-XL) 25 MG 24 hr tablet, Take 1 tablet (25 mg total) by mouth daily. Take with or immediately following a meal., Disp: 30 tablet, Rfl: 11 .  nitroGLYCERIN (NITROSTAT) 0.4 MG SL tablet, Place 1 tablet (0.4 mg total) under the tongue every 5 (five) minutes as needed for chest pain., Disp: 25 tablet, Rfl: 6 .  OVER THE COUNTER MEDICATION, Take 2 tablets by mouth daily. Hair, Skin and Nails Gummies, Disp: , Rfl:  .  OVER THE COUNTER MEDICATION, Take 2 tablets by mouth daily. B-12 gummies, Disp: , Rfl:  .  pantoprazole (PROTONIX) 40 MG tablet, Take 1 tablet (40 mg total) by mouth 2 (two) times daily., Disp: 60 tablet, Rfl: 5 .  promethazine (PHENERGAN) 25 MG tablet, Take 1 tablet (25 mg total) by mouth every 6 (six) hours as needed for nausea or vomiting. Reported on 05/31/2015, Disp: 30 tablet, Rfl: 3 .  rosuvastatin (CRESTOR) 40 MG tablet, Take 1 tablet (40 mg total) by mouth daily at 6 PM., Disp: 90 tablet, Rfl: 3 .  traZODone (DESYREL) 100 MG tablet, Take 2 tablets (200 mg  total) by mouth at bedtime., Disp: 30 tablet, Rfl: 6  Review of Systems  Constitutional: Positive for diaphoresis and fatigue. Negative for appetite change, chills and fever.  Respiratory: Negative for chest tightness and shortness of breath.   Cardiovascular: Negative for chest pain and palpitations.  Gastrointestinal: Positive for abdominal pain, diarrhea and nausea. Negative for vomiting.  Genitourinary: Positive for dysuria and frequency.  Neurological: Negative for dizziness and weakness.    Social History  Substance Use Topics  . Smoking status: Former Smoker    Types: Cigarettes    Quit date: 08/31/1994  . Smokeless tobacco: Never Used     Comment: quit 28 years ago  . Alcohol use No   Objective:   BP (!) 182/102 (BP Location: Right Arm,  Patient Position: Sitting)   Pulse 85   Temp 97.9 F (36.6 C) (Oral)   Resp 16   Wt 166 lb (75.3 kg)   SpO2 98% Comment: room air  BMI 29.41 kg/m   Physical Exam  General Appearance:    Alert, cooperative, no distress  Eyes:    PERRL, conjunctiva/corneas clear, EOM's intact       Lungs:     Clear to auscultation bilaterally, respirations unlabored  Heart:    Regular rate and rhythm  Abdomen:   bowel sounds present and normal in all 4 quadrants, soft, round or nontender. No CVA tenderness. Mild suprapubic tenderness.     Results for orders placed or performed in visit on 03/16/16  POCT Urinalysis Dipstick  Result Value Ref Range   Color, UA yellow    Clarity, UA cloudy    Glucose, UA negative    Bilirubin, UA negative    Ketones, UA negative    Spec Grav, UA >=1.030    Blood, UA small (hemolyzed)    pH, UA 6.0    Protein, UA 100 (++)    Urobilinogen, UA 0.2    Nitrite, UA Positive    Leukocytes, UA moderate (2+) (A) Negative       Assessment & Plan:     1. Urinary tract infection with hematuria, site unspecified  - Urine Culture - cefTRIAXone (ROCEPHIN) injection 1 g; Inject 1 g into the muscle once.  2. Urinary frequency  - POCT Urinalysis Dipstick  3. Nausea  - promethazine (PHENERGAN) injection 25 mg; Inject 1 mL (25 mg total) into the muscle once.  Advised to take her BP medications as soon as she gets home since promethazine should be helping with nausea by then. Counseled to take frequent small sips of water.   Call if symptoms change or if not rapidly improving.           [name], MD  [facility]

## 2016-03-18 ENCOUNTER — Ambulatory Visit: Payer: BC Managed Care – PPO | Admitting: Gastroenterology

## 2016-03-18 LAB — URINE CULTURE

## 2016-03-19 ENCOUNTER — Ambulatory Visit (INDEPENDENT_AMBULATORY_CARE_PROVIDER_SITE_OTHER): Payer: BC Managed Care – PPO | Admitting: Urology

## 2016-03-19 ENCOUNTER — Encounter: Payer: Self-pay | Admitting: Urology

## 2016-03-19 ENCOUNTER — Other Ambulatory Visit: Payer: Self-pay | Admitting: Family Medicine

## 2016-03-19 ENCOUNTER — Ambulatory Visit: Payer: BC Managed Care – PPO | Admitting: Gastroenterology

## 2016-03-19 VITALS — BP 151/71 | HR 93 | Ht 63.0 in | Wt 168.5 lb

## 2016-03-19 DIAGNOSIS — N390 Urinary tract infection, site not specified: Secondary | ICD-10-CM

## 2016-03-19 DIAGNOSIS — N952 Postmenopausal atrophic vaginitis: Secondary | ICD-10-CM | POA: Diagnosis not present

## 2016-03-19 LAB — BLADDER SCAN AMB NON-IMAGING: SCAN RESULT: 12

## 2016-03-19 NOTE — Progress Notes (Signed)
03/19/2016 11:57 AM   [place] [birth year] [id number]  Referring provider: [name], MD [address] [place], [place] [address]  Chief Complaint  Patient presents with  . Follow-up    4 month follow up recurrent uti    HPI: 46 yo WF with a history of Bipolar disorder, current smoker with CVA, CHF and CAD who presents today for further evaluation for recurrent UTI's.    Patient states that she has had a lot urinary tract infections over the last year.  Her symptoms with a urinary tract infection consist of dysuria, low back pain and sweats.    She denies gross hematuria, suprapubic pain, abdominal pain or flank pain.  She states her urine is dark and has a strong odor all the time.  She has not had any recent fevers, chills, nausea or vomiting.   She does/does not have a history of nephrolithiasis, GU surgery or GU trauma.   Reviewing her records,  she has had three documented UTI over the last year.  She is not sexually active.   She is peri menopausal.   She admits to diarrhea.   She does engage in good perineal hygiene. She does take tub baths.   She does have incontinence.  She is using incontinence pads occasionally.  She is not having pain with bladder filling.    She had a CT wo contrast on 10/31/2015 which noted mild left hydroureter without visible stone or other obstructive process. Correlate for signs of ascending infection.  Trace pleural effusions and mild basilar atelectasis.  Possible constipation.  Follow up ultrasound on 11/28/2015 Minimal right renal pelvic fullness. No prominent hydronephrosis.  No hydronephrosis noted of the left kidney on today's exam. Small amount of debris noted the bladder consistent patient's history of UTI . have independently reviewed the films.  She is drinking two 16 oz bottles of water daily.   She mainly drinks sweet tea.  Only drinks clear sodas.    PMH: Past Medical History:  Diagnosis Date  .  Anemia    iron deficiency anemia  . Anxiety   . Aortic arch aneurysm ([place])   . Arthritis   . Bipolar disorder ([place])   . CAD (coronary artery disease)    a. cath 08/2003 nonobs dzs;  b. 11/2014: mLAD 85-90% s/p PCI/DES (Xience 3.25 x 23); c. cath 04/2015: 70% mD1 @ bifurcation. Patent LAD stent - > Med Rx.; d. Cath-PCI 5/17: Patent LAD stent, D1 now ~80% - PCI Resolute DES 2.25 x 12.    . Chronic systolic CHF (congestive heart failure) ([place])    a. echo 01/29/2013 EF 40-45%, basal anteroseptal & mid anteroseptal segments abnl; b echo 05/2012 EF 50-55%, mildly dilated LA, nl RVSP; c. 10/2015 Echo: EF 55-60%, mild conc LVH, no rwma.  . CKD (chronic kidney disease), stage IV ([facility])   . Colon polyp   . CVA (cerebral vascular accident) ([place])    a. 06/2008-TEE nl LV fxn; b. 01/2013 - notes indicate short run of SVT at that time  . Depression   . Diabetes mellitus without complication ([place])   . Gastric ulcer 04/27/2011  . GERD (gastroesophageal reflux disease)   . Headache    Migrains  . Hyperlipidemia   . Hypertension   . Hypertensive heart disease   . Hypothyroidism   . Ischemic cardiomyopathy    a. EF prev 40-45% in 2014-->Improved to 55-60% (10/2015).  . Malignant melanoma of skin of scalp ([place])   .  Morbid obesity ([place])    a. s/p gastric bypass.  . Multiple sclerosis ([place]) 2015  . Neuromuscular disorder ([place])   . Neuropathy ([place])   . Orthostatic hypotension   . Syncope     Surgical History: Past Surgical History:  Procedure Laterality Date  . APPENDECTOMY    . CARDIAC CATHETERIZATION N/A 11/09/2014   Procedure: Coronary Angiography;  Surgeon: Minna Merritts, MD;  Location: [facility];  Service: Cardiovascular;  Laterality: N/A;  . CARDIAC CATHETERIZATION N/A 11/12/2014   Procedure: Coronary Stent Intervention;  Surgeon: Isaias Cowman, MD;  Location: [facility];  Service: Cardiovascular;  Laterality: N/A;  . CARDIAC CATHETERIZATION N/A 04/18/2015   Procedure:  Left Heart Cath and Coronary Angiography;  Surgeon: Minna Merritts, MD;  Location: [facility];  Service: Cardiovascular;  Laterality: N/A;  . CARDIAC CATHETERIZATION Left 06/04/2015   Procedure: Left Heart Cath and Coronary Angiography;  Surgeon: Wellington Hampshire, MD;  Location: [facility];  Service: Cardiovascular;  Laterality: Left;  . CARDIAC CATHETERIZATION N/A 06/04/2015   Procedure: Coronary Stent Intervention;  Surgeon: Wellington Hampshire, MD;  Location: [facility];  Service: Cardiovascular;  Laterality: N/A;  . CESAREAN SECTION  2001  . CORONARY ANGIOPLASTY    . DILATION AND CURETTAGE OF UTERUS    . ESOPHAGOGASTRODUODENOSCOPY (EGD) WITH PROPOFOL N/A 09/14/2014   Procedure: ESOPHAGOGASTRODUODENOSCOPY (EGD) WITH PROPOFOL;  Surgeon: Josefine Class, MD;  Location: [facility];  Service: Endoscopy;  Laterality: N/A;  . GASTRIC BYPASS  09/2009   [facility]   . Left Carotid to sublcavian artery bypass w/ subclavian artery ligation     a. Performed @ [facility].  . MELANOMA EXCISION  2016   Dr. Evorn Gong  . NOVASURE ABLATION    . RIGHT OOPHORECTOMY    . SHOULDER ARTHROSCOPY WITH OPEN ROTATOR CUFF REPAIR Right 01/07/2016   Procedure: SHOULDER ARTHROSCOPY WITH DEBRIDMENT, SUBACHROMIAL DECOMPRESSION;  Surgeon: Corky Mull, MD;  Location: [facility];  Service: Orthopedics;  Laterality: Right;  . TRIGGER FINGER RELEASE Right     Middle Finger  . TUBAL LIGATION     R tube removed still w/ Left    Home Medications:  Allergies as of 03/19/2016   No Known Allergies     Medication List       Accurate as of 03/19/16 11:57 AM. Always use your most recent med list.          aspirin EC 81 MG tablet Take 1 tablet (81 mg total) by mouth daily.   citalopram 40 MG tablet Commonly known as:  CELEXA Take 1 tablet (40 mg total) by mouth daily.   clopidogrel 75 MG tablet Commonly known as:  PLAVIX Take 1 tablet (75 mg total) by mouth daily.     diltiazem 120 MG 24 hr capsule Commonly known as:  CARDIZEM CD Take 1 capsule (120 mg total) by mouth daily.   eszopiclone 2 MG Tabs tablet Commonly known as:  LUNESTA TAKE 1 TABLET BY MOUTH AT BEDTIME AS NEEDED FOR SLEEP   gabapentin 300 MG capsule Commonly known as:  NEURONTIN Take 300 mg by mouth at bedtime. 600 mg in the morning   isosorbide mononitrate 60 MG 24 hr tablet Commonly known as:  IMDUR Take 1 tablet (60 mg total) by mouth daily.   levofloxacin 500 MG tablet Commonly known as:  LEVAQUIN Take 1 tablet (500 mg total) by mouth daily.   levothyroxine 25 MCG tablet  Commonly known as:  SYNTHROID, LEVOTHROID Take 1 tablet (25 mcg total) by mouth daily before breakfast.   losartan 50 MG tablet Commonly known as:  COZAAR Take 1 tablet (50 mg total) by mouth daily.   Magnesium 500 MG Tabs Take 500 mg by mouth daily.   metFORMIN 1000 MG tablet Commonly known as:  GLUCOPHAGE Take 1,000 mg by mouth 2 (two) times daily with a meal.   metFORMIN 1000 MG tablet Commonly known as:  GLUCOPHAGE TAKE ONE TABLET BY MOUTH TWICE DAILY   metoprolol succinate 25 MG 24 hr tablet Commonly known as:  TOPROL-XL Take 1 tablet (25 mg total) by mouth daily. Take with or immediately following a meal.   nitroGLYCERIN 0.4 MG SL tablet Commonly known as:  NITROSTAT Place 1 tablet (0.4 mg total) under the tongue every 5 (five) minutes as needed for chest pain.   OVER THE COUNTER MEDICATION Take 2 tablets by mouth daily. Hair, Skin and Nails Gummies   OVER THE COUNTER MEDICATION Take 2 tablets by mouth daily. B-12 gummies   pantoprazole 40 MG tablet Commonly known as:  PROTONIX Take 1 tablet (40 mg total) by mouth 2 (two) times daily.   promethazine 25 MG tablet Commonly known as:  PHENERGAN Take 1 tablet (25 mg total) by mouth every 6 (six) hours as needed for nausea or vomiting. Reported on 05/31/2015   rosuvastatin 40 MG tablet Commonly known as:  CRESTOR Take 1 tablet (40  mg total) by mouth daily at 6 PM.   traZODone 100 MG tablet Commonly known as:  DESYREL Take 2 tablets (200 mg total) by mouth at bedtime.       Allergies: No Known Allergies  Family History: Family History  Problem Relation Age of Onset  . Hypertension Mother   . Anxiety disorder Mother   . Depression Mother   . Bipolar disorder Mother   . Heart disease Mother   . Hyperlipidemia Mother   . Kidney disease Father   . Heart disease Father   . Hypertension Father   . Diabetes Father   . Stroke Father   . Colon cancer Father     dx in his 14's  . Anxiety disorder Father   . Depression Father   . Skin cancer Father   . Kidney disease Sister   . Thyroid nodules Sister   . Hypertension Sister   . Hypertension Sister   . Diabetes Sister   . Hyperlipidemia Sister   . Depression Sister   . Breast cancer Maternal Aunt   . Breast cancer Maternal Aunt   . Kidney cancer Neg Hx   . Bladder Cancer Neg Hx     Social History:  reports that she quit smoking about 21 years ago. Her smoking use included Cigarettes. She has never used smokeless tobacco. She reports that she does not drink alcohol or use drugs.  ROS: UROLOGY Frequent Urination?: Yes Hard to postpone urination?: No Burning/pain with urination?: Yes Get up at night to urinate?: Yes Leakage of urine?: No Urine stream starts and stops?: No Trouble starting stream?: No Do you have to strain to urinate?: No Blood in urine?: No Urinary tract infection?: Yes Sexually transmitted disease?: No Injury to kidneys or bladder?: No Painful intercourse?: No Weak stream?: No Currently pregnant?: No Vaginal bleeding?: No Last menstrual period?: n  Gastrointestinal Nausea?: Yes Vomiting?: Yes Indigestion/heartburn?: No Diarrhea?: No Constipation?: No  Constitutional Fever: No Night sweats?: Yes Weight loss?: No Fatigue?: Yes  Skin Skin rash/lesions?:  No Itching?: No  Eyes Blurred vision?: No Double vision?:  No  Ears/Nose/Throat Sore throat?: No Sinus problems?: No  Hematologic/Lymphatic Swollen glands?: No Easy bruising?: No  Cardiovascular Leg swelling?: No Chest pain?: No  Respiratory Cough?: No Shortness of breath?: No  Endocrine Excessive thirst?: Yes  Musculoskeletal Back pain?: No Joint pain?: No  Neurological Headaches?: Yes Dizziness?: No  Psychologic Depression?: Yes Anxiety?: Yes  Physical Exam: BP (!) 151/71   Pulse 93   Ht _0  (1.6 m)   Wt 168 lb 8 oz (76.4 kg)   BMI 29.85 kg/m   Constitutional: Well nourished. Alert and oriented, No acute distress. HEENT: [place] AT, moist mucus membranes. Trachea midline, no masses. Cardiovascular: No clubbing, cyanosis, or edema. Respiratory: Normal respiratory effort, no increased work of breathing. GI: Abdomen is soft, non tender, non distended, no abdominal masses. Liver and spleen not palpable.  No hernias appreciated.  Stool sample for occult testing is not indicated.   GU: No CVA tenderness.  No bladder fullness or masses.  Atrophic external genitalia, normal pubic hair distribution, no lesions.  Normal urethral meatus, no lesions, no prolapse, no discharge.   No urethral masses, tenderness and/or tenderness. No bladder fullness, tenderness or masses. Pale vagina mucosa, poor estrogen effect, no discharge, no lesions, good pelvic support, no cystocele or rectocele noted.  No cervical motion tenderness.  Uterus is freely mobile and non-fixed.  No adnexal/parametria masses or tenderness noted.  Anus and perineum are without rashes or lesions.    Skin: No rashes, bruises or suspicious lesions. Lymph: No cervical or inguinal adenopathy. Neurologic: Grossly intact, no focal deficits, moving all 4 extremities. Psychiatric: Normal mood and affect.  Laboratory Data: Lab Results  Component Value Date   WBC 13.7 (H) 01/02/2016   HGB 12.2 01/02/2016   HCT 36.3 01/02/2016   MCV 81.7 01/02/2016   PLT 231 01/02/2016     Lab Results  Component Value Date   CREATININE 1.49 (H) 01/29/2016     Lab Results  Component Value Date   HGBA1C 5.4 12/30/2015    Lab Results  Component Value Date   TSH 3.168 12/14/2015       Component Value Date/Time   CHOL 164 11/09/2014 0454   CHOL 197 12/30/2013 0355   HDL 27 (L) 11/09/2014 0454   HDL 28 (L) 12/30/2013 0355   CHOLHDL 6.1 11/09/2014 0454   VLDL 52 (H) 11/09/2014 0454   VLDL 52 (H) 12/30/2013 0355   LDLCALC 85 11/09/2014 0454   LDLCALC 117 (H) 12/30/2013 0355    Lab Results  Component Value Date   AST 21 12/13/2015   Lab Results  Component Value Date   ALT 16 12/13/2015     Pertinent Imaging: Results for [facility] JOHANNE, MCGLADE (MRN [id number]) as of 03/19/2016 11:35  Ref. Range 03/19/2016 11:29  Scan Result Unknown 12    Assessment & Plan:    1. Recurrent UTI  - criteria for recurrent UTI has been met with  3 or greater infections in one year   - Patient is instructed to increase their water intake until the urine is pale yellow or clear (10 to 12 cups daily)   - probiotics (yogurt, oral pills or vaginal suppositories), take cranberry pills or drink the juice and Vitamin C 1,000 mg daily to acidify the urine should be added to their daily regimen   -avoid soaking in tubs and wipe front to back after urinating   - advised them to have CATH  UA's for urinalysis and culture to prevent skin contamination of the specimen  - reviewed symptoms of UTI and advised not to have urine checked or be treated for UTI if not experiencing symptoms  - discussed antibiotic stewardship with the patient    2. Vaginal atrophy  - patient is not a candidate for vaginal estrogen cream due to her history of strokes  - advised patient to use vaginal probiotics nightly                                              Return in about 1 month (around 04/16/2016) for PVR and OAB questionnaire.  These notes generated with voice recognition software. I apologize for  typographical errors.  Zara Council, [facility] [address], [place], [place] [address] [phone]

## 2016-03-19 NOTE — Patient Instructions (Signed)
                                             Urinary Tract Infection Prevention Patient Education Stay Hydrated: Urinary tract infections (UTIs) are less likely to occur in someone who is drinking enough water to promote regular urination, so it is very important to stay hydrated in order to help flush out bacteria from the urinary tract. Respond to "Nature's Call": It is always a good idea to urinate as soon as you feel the need. While "holding it in" does not directly cause an infection, it can cause overdistension that can damage the lining of the bladder, making it more vulnerable to bacteria. Remove Tampons Before Going: Remember to always take out tampons before urinating, and change tampons often.  Practice Proper Bathroom Hygiene: To keep bacteria near the urethral opening to a minimum, it is important to practice proper wiping techniques (i.e. front to back wiping) to help prevent rectal bacteria from entering the uretro-genital area. It can also be helpful to take showers and avoid soaking in the bathtub.  Take a Vitamin C Supplement: About 1,000 milligrams of vitamin C taken daily can help inhibit the growth of some bacteria by acidifying the urine. Maintain Control with Cranberries: Cranberries contain hippuronic acid, which is a natural antiseptic that may help prevent the adherence of bacteria to the bladder lining. Drinking 100% pure cranberry juice or taking over the counter cranberry supplements twice daily may help to prevent an infection. However, it is important to note that cranberry juices/supplements are not helpful once a urinary tract infection (UTI) is present. Strengthen Your Core: Often, a lazy bladder (unable to empty urine properly) occurs due to lower back problem, so consider doing exercises to help strengthen your back, pelvic floor, and stomach muscles. Vaginal probiotics  Pay Attention to Your Urine: Your urine can change color for a variety of reasons, including from  the medications you take, so pay close attention to it to monitor your overall health. One key thing to note is that if your urine is typically a darker yellow, your body is dehydrated, so you need to step up your water intake.    

## 2016-04-06 ENCOUNTER — Other Ambulatory Visit: Payer: Self-pay | Admitting: Surgery

## 2016-04-06 DIAGNOSIS — M75111 Incomplete rotator cuff tear or rupture of right shoulder, not specified as traumatic: Secondary | ICD-10-CM

## 2016-04-06 DIAGNOSIS — M24111 Other articular cartilage disorders, right shoulder: Secondary | ICD-10-CM

## 2016-04-06 DIAGNOSIS — M7581 Other shoulder lesions, right shoulder: Secondary | ICD-10-CM

## 2016-04-09 ENCOUNTER — Encounter: Payer: Self-pay | Admitting: Gastroenterology

## 2016-04-09 ENCOUNTER — Ambulatory Visit (INDEPENDENT_AMBULATORY_CARE_PROVIDER_SITE_OTHER): Payer: BC Managed Care – PPO | Admitting: Gastroenterology

## 2016-04-09 VITALS — BP 131/75 | HR 72 | Ht 63.0 in | Wt 168.0 lb

## 2016-04-09 DIAGNOSIS — R112 Nausea with vomiting, unspecified: Secondary | ICD-10-CM | POA: Diagnosis not present

## 2016-04-09 DIAGNOSIS — K219 Gastro-esophageal reflux disease without esophagitis: Secondary | ICD-10-CM | POA: Diagnosis not present

## 2016-04-09 MED ORDER — SUCRALFATE 1 GM/10ML PO SUSP: 1.0000 g | Freq: Four times a day (QID) | ORAL | 1 refills | Status: DC

## 2016-04-09 NOTE — Progress Notes (Signed)
Gastroenterology Consultation  Referring Provider:     [name], MD Primary Care Physician:  [name], MD Primary Gastroenterologist:  Dr. Allen Norris     Reason for Consultation:     Nausea and vomiting        HPI:   [name] is a 46 y.o. y/o female referred for consultation & management of Nausea and vomiting by Dr. [name], MD.  This patient comes to me after being seen in [facility] for nausea and vomiting the past.  The patient was then sent by Dr. Caryn Section to the [facility] clinic and was seen by Dr. Rayann Heman for the same issue.  She reports that she continues to have nausea and vomiting.  The patient states that it all started after she had gastric bypass surgery.  Now that her gastrologist in [place] has retired and her doctor at the [place] [facility] left, she is now here to see me.  The patient had a upper endoscopy in [place] that showed her to have a ulcer in her anastomotic area.  The patient was suggested to take a PPI and Carafate but she reports that she is only been taking Protonix.  Past Medical History:  Diagnosis Date  . Anemia    iron deficiency anemia  . Anxiety   . Aortic arch aneurysm ([place])   . Arthritis   . Bipolar disorder ([place])   . CAD (coronary artery disease)    a. cath 08/2003 nonobs dzs;  b. 11/2014: mLAD 85-90% s/p PCI/DES (Xience 3.25 x 23); c. cath 04/2015: 70% mD1 @ bifurcation. Patent LAD stent - > Med Rx.; d. Cath-PCI 5/17: Patent LAD stent, D1 now ~80% - PCI Resolute DES 2.25 x 12.    . Chronic systolic CHF (congestive heart failure) ([place])    a. echo 01/29/2013 EF 40-45%, basal anteroseptal & mid anteroseptal segments abnl; b echo 05/2012 EF 50-55%, mildly dilated LA, nl RVSP; c. 10/2015 Echo: EF 55-60%, mild conc LVH, no rwma.  . CKD (chronic kidney disease), stage IV ([place])   . Colon polyp   . CVA (cerebral vascular accident) ([place])    a. 06/2008-TEE nl LV fxn; b. 01/2013 - notes indicate short run of SVT at that time  . Depression     . Diabetes mellitus without complication ([place])   . Gastric ulcer 04/27/2011  . GERD (gastroesophageal reflux disease)   . Headache    Migrains  . Hyperlipidemia   . Hypertension   . Hypertensive heart disease   . Hypothyroidism   . Ischemic cardiomyopathy    a. EF prev 40-45% in 2014-->Improved to 55-60% (10/2015).  . Malignant melanoma of skin of scalp ([place])   . Morbid obesity ([place])    a. s/p gastric bypass.  . Multiple sclerosis ([place]) 2015  . Neuromuscular disorder ([place])   . Neuropathy ([place])   . Orthostatic hypotension   . Syncope     Past Surgical History:  Procedure Laterality Date  . APPENDECTOMY    . CARDIAC CATHETERIZATION N/A 11/09/2014   Procedure: Coronary Angiography;  Surgeon: Minna Merritts, MD;  Location: [place] [facility];  Service: Cardiovascular;  Laterality: N/A;  . CARDIAC CATHETERIZATION N/A 11/12/2014   Procedure: Coronary Stent Intervention;  Surgeon: Isaias Cowman, MD;  Location: [place] [facility];  Service: Cardiovascular;  Laterality: N/A;  . CARDIAC CATHETERIZATION N/A 04/18/2015   Procedure: Left Heart Cath and Coronary Angiography;  Surgeon: Minna Merritts, MD;  Location: [place] [facility];  Service: Cardiovascular;  Laterality: N/A;  . CARDIAC CATHETERIZATION Left 06/04/2015   Procedure: Left Heart Cath and Coronary Angiography;  Surgeon: Wellington Hampshire, MD;  Location: [place] [facility];  Service: Cardiovascular;  Laterality: Left;  . CARDIAC CATHETERIZATION N/A 06/04/2015   Procedure: Coronary Stent Intervention;  Surgeon: Wellington Hampshire, MD;  Location: [place] [facility];  Service: Cardiovascular;  Laterality: N/A;  . CESAREAN SECTION  2001  . CORONARY ANGIOPLASTY    . DILATION AND CURETTAGE OF UTERUS    . ESOPHAGOGASTRODUODENOSCOPY (EGD) WITH PROPOFOL N/A 09/14/2014   Procedure: ESOPHAGOGASTRODUODENOSCOPY (EGD) WITH PROPOFOL;  Surgeon: Josefine Class, MD;  Location: [facility]COPY;  Service: Endoscopy;  Laterality: N/A;   . GASTRIC BYPASS  09/2009   [facility]   . Left Carotid to sublcavian artery bypass w/ subclavian artery ligation     a. Performed @ [facility].  . MELANOMA EXCISION  2016   Dr. Evorn Gong  . NOVASURE ABLATION    . RIGHT OOPHORECTOMY    . SHOULDER ARTHROSCOPY WITH OPEN ROTATOR CUFF REPAIR Right 01/07/2016   Procedure: SHOULDER ARTHROSCOPY WITH DEBRIDMENT, SUBACHROMIAL DECOMPRESSION;  Surgeon: Corky Mull, MD;  Location: [facility] ORS;  Service: Orthopedics;  Laterality: Right;  . TRIGGER FINGER RELEASE Right     Middle Finger  . TUBAL LIGATION     R tube removed still w/ Left    Prior to Admission medications   Medication Sig Start Date End Date Taking? Authorizing Provider  aspirin EC 81 MG tablet Take 1 tablet (81 mg total) by mouth daily. 02/19/15  Yes Hildred Priest, MD  citalopram (CELEXA) 40 MG tablet Take 1 tablet (40 mg total) by mouth daily. 11/15/15  Yes [name], MD  clopidogrel (PLAVIX) 75 MG tablet Take 1 tablet (75 mg total) by mouth daily. 02/27/16  Yes Minna Merritts, MD  diltiazem (CARDIZEM CD) 120 MG 24 hr capsule Take 1 capsule (120 mg total) by mouth daily. 01/31/16  Yes Minna Merritts, MD  eszopiclone (LUNESTA) 2 MG TABS tablet TAKE 1 TABLET BY MOUTH AT BEDTIME AS NEEDED FOR SLEEP 03/01/16  Yes [name], MD  gabapentin (NEURONTIN) 300 MG capsule Take 300 mg by mouth at bedtime. 600 mg in the morning   Yes Historical Provider, MD  isosorbide mononitrate (IMDUR) 60 MG 24 hr tablet Take 1 tablet (60 mg total) by mouth daily. 12/19/15  Yes Minna Merritts, MD  levothyroxine (SYNTHROID, LEVOTHROID) 25 MCG tablet Take 1 tablet (25 mcg total) by mouth daily before breakfast. 02/28/16  Yes [name], MD  losartan (COZAAR) 50 MG tablet Take 1 tablet (50 mg total) by mouth daily. 01/14/16  Yes [name], MD  metFORMIN (GLUCOPHAGE) 1000 MG tablet TAKE ONE TABLET BY MOUTH TWICE DAILY 03/19/16  Yes [name], MD  metoprolol  succinate (TOPROL-XL) 25 MG 24 hr tablet Take 1 tablet (25 mg total) by mouth daily. Take with or immediately following a meal. 02/27/16 02/26/17 Yes Minna Merritts, MD  nitroGLYCERIN (NITROSTAT) 0.4 MG SL tablet Place 1 tablet (0.4 mg total) under the tongue every 5 (five) minutes as needed for chest pain. 04/30/15  Yes Minna Merritts, MD  OVER THE COUNTER MEDICATION Take 2 tablets by mouth daily. Hair, Skin and Nails Gummies   Yes Historical Provider, MD  OVER THE COUNTER MEDICATION Take 2 tablets by mouth daily. B-12 gummies   Yes Historical Provider, MD  pantoprazole (PROTONIX) 40 MG tablet Take  1 tablet (40 mg total) by mouth 2 (two) times daily. 04/18/15  Yes Gladstone Lighter, MD  promethazine (PHENERGAN) 25 MG tablet Take 1 tablet (25 mg total) by mouth every 6 (six) hours as needed for nausea or vomiting. Reported on 05/31/2015 11/15/15  Yes [name], MD  rosuvastatin (CRESTOR) 40 MG tablet Take 1 tablet (40 mg total) by mouth daily at 6 PM. 04/30/15  Yes Minna Merritts, MD  traZODone (DESYREL) 100 MG tablet Take 2 tablets (200 mg total) by mouth at bedtime. 12/02/15  Yes [name], MD  sucralfate (CARAFATE) 1 GM/10ML suspension Take 10 mLs (1 g total) by mouth 4 (four) times daily. 04/09/16   [name], MD    Family History  Problem Relation Age of Onset  . Hypertension Mother   . Anxiety disorder Mother   . Depression Mother   . Bipolar disorder Mother   . Heart disease Mother   . Hyperlipidemia Mother   . Kidney disease Father   . Heart disease Father   . Hypertension Father   . Diabetes Father   . Stroke Father   . Colon cancer Father     dx in his 25's  . Anxiety disorder Father   . Depression Father   . Skin cancer Father   . Kidney disease Sister   . Thyroid nodules Sister   . Hypertension Sister   . Hypertension Sister   . Diabetes Sister   . Hyperlipidemia Sister   . Depression Sister   . Breast cancer Maternal Aunt   . Breast cancer Maternal Aunt     . Kidney cancer Neg Hx   . Bladder Cancer Neg Hx      Social History  Substance Use Topics  . Smoking status: Former Smoker    Types: Cigarettes    Quit date: 08/31/1994  . Smokeless tobacco: Never Used     Comment: quit 28 years ago  . Alcohol use No    Allergies as of 04/09/2016  . (No Known Allergies)    Review of Systems:    All systems reviewed and negative except where noted in HPI.   Physical Exam:  BP 131/75   Pulse 72   Ht 5\' 3"  (1.6 m)   Wt 168 lb (76.2 kg)   BMI 29.76 kg/m  No LMP recorded. Patient has had an ablation. Psych:  Alert and cooperative. Normal mood and affect. General:   Alert,  Well-developed, well-nourished, pleasant and cooperative in NAD Head:  Normocephalic and atraumatic. Eyes:  Sclera clear, no icterus.   Conjunctiva pink. Ears:  Normal auditory acuity. Nose:  No deformity, discharge, or lesions. Mouth:  No deformity or lesions,oropharynx pink & moist. Neck:  Supple; no masses or thyromegaly. Lungs:  Respirations even and unlabored.  Clear throughout to auscultation.   No wheezes, crackles, or rhonchi. No acute distress. Heart:  Regular rate and rhythm; no murmurs, clicks, rubs, or gallops. Abdomen:  Normal bowel sounds.  No bruits.  Soft, Mildly diffusely tender and non-distended without masses, hepatosplenomegaly or hernias noted.  No guarding or rebound tenderness.  Negative Carnett sign.   Rectal:  Deferred.  Msk:  Symmetrical without gross deformities.  Good, equal movement & strength bilaterally. Pulses:  Normal pulses noted. Extremities:  No clubbing or edema.  No cyanosis. Neurologic:  Alert and oriented x3;  grossly normal neurologically. Skin:  Intact without significant lesions or rashes.  No jaundice. Lymph Nodes:  No significant cervical adenopathy. Psych:  Alert  and cooperative. Normal mood and affect.  Imaging Studies: No results found.  Assessment and Plan:   [name] is a 46 y.o. y/o female who has been  seen by multiple gastrologist in the past. The patient was seen by Dr. Henrene Pastor in [place] and then by Dr. Rayann Heman at [place] [facility]. The patient has had her symptoms since having her gastric bypass surgery.  The patient has been explained the possible etiologies of her nausea.  She has been told that it can be repeat peptic ulcer disease versus reflux versus a possible anatomical change since her surgery.  The patient will be started on Dexilant and her Protonix will be stopped.  She will also be set up for a upper endoscopy and she will be started on liquid Carafate 4 times a day.  The patient has been explained the plan and agrees with it.    [name], MD. Marval Regal   Note: This dictation was prepared with Dragon dictation along with smaller phrase technology. Any transcriptional errors that result from this process are unintentional.

## 2016-04-15 ENCOUNTER — Other Ambulatory Visit: Payer: Self-pay

## 2016-04-15 DIAGNOSIS — D508 Other iron deficiency anemias: Secondary | ICD-10-CM

## 2016-04-15 NOTE — Progress Notes (Deleted)
[facility]  Telephone:(336) [phone] [phone]) [phone]  ID: [name] OB: December 20, 1970  MR#: [id number]  OLM#:[id number]  Patient Care Team: [name], MD as PCP - General (Family Medicine) Dagoberto Ligas, MD as Consulting Physician (Nephrology) Minna Merritts, MD as Consulting Physician (Cardiology) Vladimir Crofts, MD as Consulting Physician (Neurology) Irene Shipper, MD as Consulting Physician (Gastroenterology)  CHIEF COMPLAINT: Iron deficiency anemia.  INTERVAL HISTORY: Patient is here today for labs and further evaluation and consideration of IV feraheme. She was admitted overnight recently for hypertensive urgency and chest pain. She currently feels well and is back to her baseline. She has no neurologic complaints. She denies any recent fevers or illnesses. She has a good appetite and denies weight loss. She denies any chest pain or shortness of breath. She denies any nausea, vomiting, constipation, or diarrhea. She has no melena or hematochezia. She has no urinary complaints. Patient offers no further specific complaints.  REVIEW OF SYSTEMS:   Review of Systems  Constitutional: Positive for malaise/fatigue. Negative for fever and weight loss.  Respiratory: Negative.  Negative for cough and shortness of breath.   Cardiovascular: Negative.  Negative for chest pain, palpitations and leg swelling.  Gastrointestinal: Negative.  Negative for blood in stool and melena.  Genitourinary: Negative.   Musculoskeletal: Negative.   Neurological: Positive for weakness. Negative for sensory change.  Psychiatric/Behavioral: Negative.  The patient is not nervous/anxious.     As per HPI. Otherwise, a complete review of systems is negative.  PAST MEDICAL HISTORY: Past Medical History:  Diagnosis Date  . Anemia    iron deficiency anemia  . Anxiety   . Aortic arch aneurysm ([place])   . Arthritis   . Bipolar disorder ([place])   . CAD (coronary artery disease)    a.  cath 08/2003 nonobs dzs;  b. 11/2014: mLAD 85-90% s/p PCI/DES (Xience 3.25 x 23); c. cath 04/2015: 70% mD1 @ bifurcation. Patent LAD stent - > Med Rx.; d. Cath-PCI 5/17: Patent LAD stent, D1 now ~80% - PCI Resolute DES 2.25 x 12.    . Chronic systolic CHF (congestive heart failure) ([place])    a. echo 01/29/2013 EF 40-45%, basal anteroseptal & mid anteroseptal segments abnl; b echo 05/2012 EF 50-55%, mildly dilated LA, nl RVSP; c. 10/2015 Echo: EF 55-60%, mild conc LVH, no rwma.  . CKD (chronic kidney disease), stage IV ([place])   . Colon polyp   . CVA (cerebral vascular accident) ([place])    a. 06/2008-TEE nl LV fxn; b. 01/2013 - notes indicate short run of SVT at that time  . Depression   . Diabetes mellitus without complication ([place])   . Gastric ulcer 04/27/2011  . GERD (gastroesophageal reflux disease)   . Headache    Migrains  . Hyperlipidemia   . Hypertension   . Hypertensive heart disease   . Hypothyroidism   . Ischemic cardiomyopathy    a. EF prev 40-45% in 2014-->Improved to 55-60% (10/2015).  . Malignant melanoma of skin of scalp ([place])   . Morbid obesity ([place])    a. s/p gastric bypass.  . Multiple sclerosis ([place]) 2015  . Neuromuscular disorder ([place])   . Neuropathy ([place])   . Orthostatic hypotension   . Syncope     PAST SURGICAL HISTORY: Past Surgical History:  Procedure Laterality Date  . APPENDECTOMY    . CARDIAC CATHETERIZATION N/A 11/09/2014   Procedure: Coronary Angiography;  Surgeon: Minna Merritts, MD;  Location: [place] [facility];  Service: Cardiovascular;  Laterality: N/A;  . CARDIAC CATHETERIZATION N/A 11/12/2014   Procedure: Coronary Stent Intervention;  Surgeon: Isaias Cowman, MD;  Location: [place] [facility];  Service: Cardiovascular;  Laterality: N/A;  . CARDIAC CATHETERIZATION N/A 04/18/2015   Procedure: Left Heart Cath and Coronary Angiography;  Surgeon: Minna Merritts, MD;  Location: [place] [facility];  Service: Cardiovascular;  Laterality: N/A;  .  CARDIAC CATHETERIZATION Left 06/04/2015   Procedure: Left Heart Cath and Coronary Angiography;  Surgeon: Wellington Hampshire, MD;  Location: [place];  Service: Cardiovascular;  Laterality: Left;  . CARDIAC CATHETERIZATION N/A 06/04/2015   Procedure: Coronary Stent Intervention;  Surgeon: Wellington Hampshire, MD;  Location: [place] [facility];  Service: Cardiovascular;  Laterality: N/A;  . CESAREAN SECTION  2001  . CORONARY ANGIOPLASTY    . DILATION AND CURETTAGE OF UTERUS    . ESOPHAGOGASTRODUODENOSCOPY (EGD) WITH PROPOFOL N/A 09/14/2014   Procedure: ESOPHAGOGASTRODUODENOSCOPY (EGD) WITH PROPOFOL;  Surgeon: Josefine Class, MD;  Location: [facility] ENDOSCOPY;  Service: Endoscopy;  Laterality: N/A;  . GASTRIC BYPASS  09/2009   [facility]   . Left Carotid to sublcavian artery bypass w/ subclavian artery ligation     a. Performed @ [facility].  . MELANOMA EXCISION  2016   Dr. Evorn Gong  . NOVASURE ABLATION    . RIGHT OOPHORECTOMY    . SHOULDER ARTHROSCOPY WITH OPEN ROTATOR CUFF REPAIR Right 01/07/2016   Procedure: SHOULDER ARTHROSCOPY WITH DEBRIDMENT, SUBACHROMIAL DECOMPRESSION;  Surgeon: Corky Mull, MD;  Location: [facility] ORS;  Service: Orthopedics;  Laterality: Right;  . TRIGGER FINGER RELEASE Right     Middle Finger  . TUBAL LIGATION     R tube removed still w/ Left    FAMILY HISTORY Family History  Problem Relation Age of Onset  . Hypertension Mother   . Anxiety disorder Mother   . Depression Mother   . Bipolar disorder Mother   . Heart disease Mother   . Hyperlipidemia Mother   . Kidney disease Father   . Heart disease Father   . Hypertension Father   . Diabetes Father   . Stroke Father   . Colon cancer Father     dx in his 5's  . Anxiety disorder Father   . Depression Father   . Skin cancer Father   . Kidney disease Sister   . Thyroid nodules Sister   . Hypertension Sister   . Hypertension Sister   . Diabetes Sister   . Hyperlipidemia Sister   .  Depression Sister   . Breast cancer Maternal Aunt   . Breast cancer Maternal Aunt   . Kidney cancer Neg Hx   . Bladder Cancer Neg Hx        ADVANCED DIRECTIVES:    HEALTH MAINTENANCE: Social History  Substance Use Topics  . Smoking status: Former Smoker    Types: Cigarettes    Quit date: 08/31/1994  . Smokeless tobacco: Never Used     Comment: quit 28 years ago  . Alcohol use No     No Known Allergies  Current Outpatient Prescriptions  Medication Sig Dispense Refill  . aspirin EC 81 MG tablet Take 1 tablet (81 mg total) by mouth daily.    . citalopram (CELEXA) 40 MG tablet Take 1 tablet (40 mg total) by mouth daily. 30 tablet 5  . clopidogrel (PLAVIX) 75 MG tablet Take 1 tablet (75 mg total) by mouth daily. 30 tablet 11  . diltiazem (  CARDIZEM CD) 120 MG 24 hr capsule Take 1 capsule (120 mg total) by mouth daily. 90 capsule 3  . eszopiclone (LUNESTA) 2 MG TABS tablet TAKE 1 TABLET BY MOUTH AT BEDTIME AS NEEDED FOR SLEEP 30 tablet 5  . gabapentin (NEURONTIN) 300 MG capsule Take 300 mg by mouth at bedtime. 600 mg in the morning    . isosorbide mononitrate (IMDUR) 60 MG 24 hr tablet Take 1 tablet (60 mg total) by mouth daily. 30 tablet 6  . levothyroxine (SYNTHROID, LEVOTHROID) 25 MCG tablet Take 1 tablet (25 mcg total) by mouth daily before breakfast. 30 tablet 12  . losartan (COZAAR) 50 MG tablet Take 1 tablet (50 mg total) by mouth daily. 30 tablet 12  . metFORMIN (GLUCOPHAGE) 1000 MG tablet TAKE ONE TABLET BY MOUTH TWICE DAILY 60 tablet 12  . metoprolol succinate (TOPROL-XL) 25 MG 24 hr tablet Take 1 tablet (25 mg total) by mouth daily. Take with or immediately following a meal. 30 tablet 11  . nitroGLYCERIN (NITROSTAT) 0.4 MG SL tablet Place 1 tablet (0.4 mg total) under the tongue every 5 (five) minutes as needed for chest pain. 25 tablet 6  . OVER THE COUNTER MEDICATION Take 2 tablets by mouth daily. Hair, Skin and Nails Gummies    . OVER THE COUNTER MEDICATION Take 2  tablets by mouth daily. B-12 gummies    . pantoprazole (PROTONIX) 40 MG tablet Take 1 tablet (40 mg total) by mouth 2 (two) times daily. 60 tablet 5  . promethazine (PHENERGAN) 25 MG tablet Take 1 tablet (25 mg total) by mouth every 6 (six) hours as needed for nausea or vomiting. Reported on 05/31/2015 30 tablet 3  . rosuvastatin (CRESTOR) 40 MG tablet Take 1 tablet (40 mg total) by mouth daily at 6 PM. 90 tablet 3  . sucralfate (CARAFATE) 1 GM/10ML suspension Take 10 mLs (1 g total) by mouth 4 (four) times daily. 420 mL 1  . traZODone (DESYREL) 100 MG tablet Take 2 tablets (200 mg total) by mouth at bedtime. 30 tablet 6   No current facility-administered medications for this visit.     OBJECTIVE: There were no vitals filed for this visit.   There is no height or weight on file to calculate BMI.    ECOG FS:0 - Asymptomatic  General: Well-developed, well-nourished, no acute distress. Eyes: Pink conjunctiva, anicteric sclera. Lungs: Clear to auscultation bilaterally. Heart: Regular rate and rhythm. No rubs, murmurs, or gallops. Abdomen: Soft, nontender, nondistended. No organomegaly noted, normoactive bowel sounds. Musculoskeletal: No edema, cyanosis, or clubbing. Neuro: Alert, answering all questions appropriately. Cranial nerves grossly intact. Skin: No rashes or petechiae noted. Psych: Normal affect.  LAB RESULTS:  Lab Results  Component Value Date   NA 146 (H) 01/29/2016   K 4.2 01/29/2016   CL 103 01/29/2016   CO2 25 01/29/2016   GLUCOSE 98 01/29/2016   BUN 22 01/29/2016   CREATININE 1.49 (H) 01/29/2016   CALCIUM 9.6 01/29/2016   PROT 6.9 12/13/2015   ALBUMIN 4.1 01/29/2016   AST 21 12/13/2015   ALT 16 12/13/2015   ALKPHOS 73 12/13/2015   BILITOT 0.4 12/13/2015   GFRNONAA 42 (L) 01/29/2016   GFRAA 48 (L) 01/29/2016    Lab Results  Component Value Date   WBC 13.7 (H) 01/02/2016   NEUTROABS 11.4 (H) 01/02/2016   HGB 12.2 01/02/2016   HCT 36.3 01/02/2016   MCV 81.7  01/02/2016   PLT 231 01/02/2016   Lab Results  Component Value  Date   IRON 42 12/11/2015   TIBC 215 (L) 12/11/2015   IRONPCTSAT 20 12/11/2015    Lab Results  Component Value Date   FERRITIN 79 12/11/2015     STUDIES: No results found.  ASSESSMENT: Iron deficiency anemia.  PLAN:    1. Iron deficiency anemia: Possibly secondary to poor absorption given patient's history of gastric bypass surgery. Patient's hemoglobin and iron stores continue to be within normal limits. Previous, her entire anemia workup was also either negative or within normal limits. Patient does not take oral iron.  No IV feraheme needed today. Patient last received IV iron in March 2017. Patient will return to clinic in 4 months with repeat laboratory work and further evaluation.  2. Hypertension: Patient's blood pressure improved from her admission this past weekend, but still elevated. Continue monitoring and treatment per primary care. 3. Chronic renal insufficiency: Patient's creatinine appears to be at her baseline. Continue monitoring treatment per nephrology.   Patient expressed understanding and was in agreement with this plan. She also understands that She can call clinic at any time with any questions, concerns, or complaints.   Lloyd Huger, MD   04/15/2016 9:54 PM

## 2016-04-15 NOTE — Progress Notes (Signed)
04/16/2016 12:10 PM   [place] 06-12-1970 [id number]  Referring provider: [name], MD [address] [place], [place] [address]  Chief Complaint  Patient presents with  . Recurrent UTI    1 month follow up  . Vaginal Atrophy    HPI: 46 yo WF who presents today for a one month follow up for a history or recurrent UTI's and vaginal atrophy.    Background history 46 yo WF with a history of Bipolar disorder, current smoker with CVA, CHF and CAD who presents today for further evaluation for recurrent UTI's.  Patient states that she has had a lot urinary tract infections over the last year. Her symptoms with a urinary tract infection consist of dysuria, low back pain and sweats.  She denies gross hematuria, suprapubic pain, abdominal pain or flank pain.  She states her urine is dark and has a strong odor all the time.  She has not had any recent fevers, chills, nausea or vomiting.   She does/does not have a history of nephrolithiasis, GU surgery or GU trauma.  Reviewing her records,  she has had three documented UTI over the last year.  She is not sexually active.   She is peri menopausal.  She admits to diarrhea.  She does engage in good perineal hygiene. She does take tub baths.  She does have incontinence.  She is using incontinence pads occasionally.  She is not having pain with bladder filling.  She had a CT wo contrast on 10/31/2015 which noted mild left hydroureter without visible stone or other obstructive process. Correlate for signs of ascending infection.  Trace pleural effusions and mild basilar atelectasis.  Possible constipation.  Follow up ultrasound on 11/28/2015 Minimal right renal pelvic fullness. No prominent hydronephrosis.  No hydronephrosis noted of the left kidney on today's exam.  Small amount of debris noted the bladder consistent patient's history of UTI . have independently reviewed the films.  She is drinking two 16 oz bottles of water daily.    She mainly drinks sweet tea.  Only drinks clear sodas.    History of recurrent UTI's She has had three documented UTI over the last year.  Risk factors for UTI's; vaginal atrophy, diarrhea, soaking in the time, incontinence and sugar-free drinks.  She has had an UTI positive for Klebsiella since her last visit with [place] one month ago.  She is not having dysuria, gross hematuria and suprapubic pain.  She has not had fevers, chills, nausea or vomiting.    Vaginal atrophy Not a candidate for vaginal estrogen cream due to strokes.    Frequency She is experiencing urgency x 8, frequency x 8, incontinence x 0-3 and nocturia x 8.  She is limiting fluids in an effort to reduce voids and engages in toilet mapping.   Her PVR is 0 mL.  She states she had been evaluated at [facility] and was told she drinks too much.     PMH: Past Medical History:  Diagnosis Date  . Anemia    iron deficiency anemia  . Anxiety   . Aortic arch aneurysm ([place])   . Arthritis   . Bipolar disorder ([place])   . CAD (coronary artery disease)    a. cath 08/2003 nonobs dzs;  b. 11/2014: mLAD 85-90% s/p PCI/DES (Xience 3.25 x 23); c. cath 04/2015: 70% mD1 @ bifurcation. Patent LAD stent - > Med Rx.; d. Cath-PCI 5/17: Patent LAD stent, D1 now ~80% - PCI Resolute  DES 2.25 x 12.    . Chronic systolic CHF (congestive heart failure) ([place])    a. echo 01/29/2013 EF 40-45%, basal anteroseptal & mid anteroseptal segments abnl; b echo 05/2012 EF 50-55%, mildly dilated LA, nl RVSP; c. 10/2015 Echo: EF 55-60%, mild conc LVH, no rwma.  . CKD (chronic kidney disease), stage IV ([place])   . Colon polyp   . CVA (cerebral vascular accident) ([place])    a. 06/2008-TEE nl LV fxn; b. 01/2013 - notes indicate short run of SVT at that time  . Depression   . Diabetes mellitus without complication ([place])   . Gastric ulcer 04/27/2011  . GERD (gastroesophageal reflux disease)   . Headache    Migrains  . Hyperlipidemia   . Hypertension   . Hypertensive heart disease     . Hypothyroidism   . Ischemic cardiomyopathy    a. EF prev 40-45% in 2014-->Improved to 55-60% (10/2015).  . Malignant melanoma of skin of scalp ([place])   . Morbid obesity ([place])    a. s/p gastric bypass.  . Multiple sclerosis ([place]) 2015  . Neuromuscular disorder ([place])   . Neuropathy ([place])   . Orthostatic hypotension   . Syncope     Surgical History: Past Surgical History:  Procedure Laterality Date  . APPENDECTOMY    . CARDIAC CATHETERIZATION N/A 11/09/2014   Procedure: Coronary Angiography;  Surgeon: Minna Merritts, MD;  Location: [facility];  Service: Cardiovascular;  Laterality: N/A;  . CARDIAC CATHETERIZATION N/A 11/12/2014   Procedure: Coronary Stent Intervention;  Surgeon: Isaias Cowman, MD;  Location: [facility];  Service: Cardiovascular;  Laterality: N/A;  . CARDIAC CATHETERIZATION N/A 04/18/2015   Procedure: Left Heart Cath and Coronary Angiography;  Surgeon: Minna Merritts, MD;  Location: [facility];  Service: Cardiovascular;  Laterality: N/A;  . CARDIAC CATHETERIZATION Left 06/04/2015   Procedure: Left Heart Cath and Coronary Angiography;  Surgeon: Wellington Hampshire, MD;  Location: [facility];  Service: Cardiovascular;  Laterality: Left;  . CARDIAC CATHETERIZATION N/A 06/04/2015   Procedure: Coronary Stent Intervention;  Surgeon: Wellington Hampshire, MD;  Location: [facility];  Service: Cardiovascular;  Laterality: N/A;  . CESAREAN SECTION  2001  . CORONARY ANGIOPLASTY    . DILATION AND CURETTAGE OF UTERUS    . ESOPHAGOGASTRODUODENOSCOPY (EGD) WITH PROPOFOL N/A 09/14/2014   Procedure: ESOPHAGOGASTRODUODENOSCOPY (EGD) WITH PROPOFOL;  Surgeon: Josefine Class, MD;  Location: [facility] ENDOSCOPY;  Service: Endoscopy;  Laterality: N/A;  . GASTRIC BYPASS  09/2009   [facility]   . Left Carotid to sublcavian artery bypass w/ subclavian artery ligation     a. Performed @ [facility].  . MELANOMA EXCISION  2016   Dr.  Evorn Gong  . NOVASURE ABLATION    . RIGHT OOPHORECTOMY    . SHOULDER ARTHROSCOPY WITH OPEN ROTATOR CUFF REPAIR Right 01/07/2016   Procedure: SHOULDER ARTHROSCOPY WITH DEBRIDMENT, SUBACHROMIAL DECOMPRESSION;  Surgeon: Corky Mull, MD;  Location: [facility] OR[facility];  Service: Orthopedics;  Laterality: Right;  . TRIGGER FINGER RELEASE Right     Middle Finger  . TUBAL LIGATION     R tube removed still w/ Left    Home Medications:  Allergies as of 04/16/2016   No Known Allergies     Medication List       Accurate as of 04/16/16 12:10 PM. Always use your most recent med list.          aspirin EC 81  MG tablet Take 1 tablet (81 mg total) by mouth daily.   citalopram 40 MG tablet Commonly known as:  CELEXA Take 1 tablet (40 mg total) by mouth daily.   clopidogrel 75 MG tablet Commonly known as:  PLAVIX Take 1 tablet (75 mg total) by mouth daily.   diltiazem 120 MG 24 hr capsule Commonly known as:  CARDIZEM CD Take 1 capsule (120 mg total) by mouth daily.   eszopiclone 2 MG Tabs tablet Commonly known as:  LUNESTA TAKE 1 TABLET BY MOUTH AT BEDTIME AS NEEDED FOR SLEEP   gabapentin 300 MG capsule Commonly known as:  NEURONTIN Take 300 mg by mouth at bedtime. 600 mg in the morning   isosorbide mononitrate 60 MG 24 hr tablet Commonly known as:  IMDUR Take 1 tablet (60 mg total) by mouth daily.   levothyroxine 25 MCG tablet Commonly known as:  SYNTHROID, LEVOTHROID Take 1 tablet (25 mcg total) by mouth daily before breakfast.   losartan 50 MG tablet Commonly known as:  COZAAR Take 1 tablet (50 mg total) by mouth daily.   metFORMIN 1000 MG tablet Commonly known as:  GLUCOPHAGE TAKE ONE TABLET BY MOUTH TWICE DAILY   metoprolol succinate 25 MG 24 hr tablet Commonly known as:  TOPROL-XL Take 1 tablet (25 mg total) by mouth daily. Take with or immediately following a meal.   nitroGLYCERIN 0.4 MG SL tablet Commonly known as:  NITROSTAT Place 1 tablet (0.4 mg total) under the tongue  every 5 (five) minutes as needed for chest pain.   OVER THE COUNTER MEDICATION Take 2 tablets by mouth daily. Hair, Skin and Nails Gummies   OVER THE COUNTER MEDICATION Take 2 tablets by mouth daily. B-12 gummies   pantoprazole 40 MG tablet Commonly known as:  PROTONIX Take 1 tablet (40 mg total) by mouth 2 (two) times daily.   promethazine 25 MG tablet Commonly known as:  PHENERGAN Take 1 tablet (25 mg total) by mouth every 6 (six) hours as needed for nausea or vomiting. Reported on 05/31/2015   rosuvastatin 40 MG tablet Commonly known as:  CRESTOR Take 1 tablet (40 mg total) by mouth daily at 6 PM.   sucralfate 1 GM/10ML suspension Commonly known as:  CARAFATE Take 10 mLs (1 g total) by mouth 4 (four) times daily.   traZODone 100 MG tablet Commonly known as:  DESYREL Take 2 tablets (200 mg total) by mouth at bedtime.       Allergies: No Known Allergies  Family History: Family History  Problem Relation Age of Onset  . Hypertension Mother   . Anxiety disorder Mother   . Depression Mother   . Bipolar disorder Mother   . Heart disease Mother   . Hyperlipidemia Mother   . Kidney disease Father   . Heart disease Father   . Hypertension Father   . Diabetes Father   . Stroke Father   . Colon cancer Father     dx in his 62's  . Anxiety disorder Father   . Depression Father   . Skin cancer Father   . Kidney disease Sister   . Thyroid nodules Sister   . Hypertension Sister   . Hypertension Sister   . Diabetes Sister   . Hyperlipidemia Sister   . Depression Sister   . Breast cancer Maternal Aunt   . Breast cancer Maternal Aunt   . Kidney cancer Neg Hx   . Bladder Cancer Neg Hx     Social History:  reports that she quit smoking about 21 years ago. Her smoking use included Cigarettes. She has never used smokeless tobacco. She reports that she does not drink alcohol or use drugs.  ROS: UROLOGY Frequent Urination?: Yes Hard to postpone urination?:  No Burning/pain with urination?: No Get up at night to urinate?: Yes Leakage of urine?: Yes Urine stream starts and stops?: No Trouble starting stream?: No Do you have to strain to urinate?: No Blood in urine?: No Urinary tract infection?: No Sexually transmitted disease?: No Injury to kidneys or bladder?: No Painful intercourse?: No Weak stream?: No Currently pregnant?: No Vaginal bleeding?: No Last menstrual period?: n  Gastrointestinal Nausea?: No Vomiting?: No Indigestion/heartburn?: No Diarrhea?: No Constipation?: No  Constitutional Fever: No Night sweats?: No Weight loss?: No Fatigue?: No  Skin Skin rash/lesions?: No Itching?: No  Eyes Blurred vision?: No Double vision?: No  Ears/Nose/Throat Sore throat?: No Sinus problems?: No  Hematologic/Lymphatic Swollen glands?: No Easy bruising?: No  Cardiovascular Leg swelling?: No Chest pain?: No  Respiratory Cough?: No Shortness of breath?: No  Endocrine Excessive thirst?: Yes  Musculoskeletal Back pain?: No Joint pain?: No  Neurological Headaches?: No Dizziness?: No  Psychologic Depression?: Yes Anxiety?: No  Physical Exam: BP (!) 144/84   Pulse 71   Ht 5\' 3"  (1.6 m)   Wt 167 lb (75.8 kg)   BMI 29.58 kg/m   Constitutional: Well nourished. Alert and oriented, No acute distress. HEENT: [place] AT, moist mucus membranes. Trachea midline, no masses. Cardiovascular: No clubbing, cyanosis, or edema. Respiratory: Normal respiratory effort, no increased work of breathing. GI: Abdomen is soft, non tender, non distended, no abdominal masses. Liver and spleen not palpable.  No hernias appreciated.  Stool sample for occult testing is not indicated.   GU: No CVA tenderness.  No bladder fullness or masses.   Skin: No rashes, bruises or suspicious lesions. Lymph: No cervical or inguinal adenopathy. Neurologic: Grossly intact, no focal deficits, moving all 4 extremities. Psychiatric: Normal mood and  affect.  Laboratory Data: Lab Results  Component Value Date   WBC 13.7 (H) 01/02/2016   HGB 12.2 01/02/2016   HCT 36.3 01/02/2016   MCV 81.7 01/02/2016   PLT 231 01/02/2016    Lab Results  Component Value Date   CREATININE 1.49 (H) 01/29/2016     Lab Results  Component Value Date   HGBA1C 5.4 12/30/2015    Lab Results  Component Value Date   TSH 3.168 12/14/2015       Component Value Date/Time   CHOL 164 11/09/2014 0454   CHOL 197 12/30/2013 0355   HDL 27 (L) 11/09/2014 0454   HDL 28 (L) 12/30/2013 0355   CHOLHDL 6.1 11/09/2014 0454   VLDL 52 (H) 11/09/2014 0454   VLDL 52 (H) 12/30/2013 0355   LDLCALC 85 11/09/2014 0454   LDLCALC 117 (H) 12/30/2013 0355    Lab Results  Component Value Date   AST 21 12/13/2015   Lab Results  Component Value Date   ALT 16 12/13/2015     Pertinent Imaging: Results for ST BRYNDA, HEICK (MRN [id number]) as of 04/16/2016 14:54  Ref. Range 04/16/2016 12:00  Scan Result Unknown 0     Assessment & Plan:    1. Recurrent UTI's  - Reviewed strategies for UTI prevention  - advised them to have CATH UA's for urinalysis and culture to prevent skin contamination of the specimen  - reviewed symptoms of UTI and advised not to have urine checked or be treated  for UTI if not experiencing symptoms  - discussed antibiotic stewardship with the patient    2. Vaginal atrophy  - patient is not a candidate for vaginal estrogen cream due to her history of strokes  - advised patient to use vaginal probiotics nightly                                            3. Frequency  - patient failed OAB medications as she went into retention while on the medication  - we discussed PT and PTNS as options for treatment  - she would like to pursue PTNS treatment   Return for schedule PTNS.  These notes generated with voice recognition software. I apologize for typographical errors.  Zara Council, [facility] [address], [place], [place] [address] [phone]

## 2016-04-16 ENCOUNTER — Encounter: Payer: Self-pay | Admitting: Urology

## 2016-04-16 ENCOUNTER — Other Ambulatory Visit: Payer: Self-pay

## 2016-04-16 ENCOUNTER — Ambulatory Visit (INDEPENDENT_AMBULATORY_CARE_PROVIDER_SITE_OTHER): Payer: BC Managed Care – PPO | Admitting: Urology

## 2016-04-16 VITALS — BP 144/84 | HR 71 | Ht 63.0 in | Wt 167.0 lb

## 2016-04-16 DIAGNOSIS — N952 Postmenopausal atrophic vaginitis: Secondary | ICD-10-CM

## 2016-04-16 DIAGNOSIS — N390 Urinary tract infection, site not specified: Secondary | ICD-10-CM | POA: Diagnosis not present

## 2016-04-16 DIAGNOSIS — R112 Nausea with vomiting, unspecified: Secondary | ICD-10-CM

## 2016-04-16 DIAGNOSIS — K2100 Gastro-esophageal reflux disease with esophagitis, without bleeding: Secondary | ICD-10-CM

## 2016-04-16 DIAGNOSIS — R350 Frequency of micturition: Secondary | ICD-10-CM | POA: Diagnosis not present

## 2016-04-16 DIAGNOSIS — Z1211 Encounter for screening for malignant neoplasm of colon: Secondary | ICD-10-CM

## 2016-04-16 LAB — BLADDER SCAN AMB NON-IMAGING: SCAN RESULT: 0

## 2016-04-17 ENCOUNTER — Inpatient Hospital Stay: Payer: BC Managed Care – PPO

## 2016-04-17 ENCOUNTER — Inpatient Hospital Stay: Payer: BC Managed Care – PPO | Admitting: Oncology

## 2016-04-21 ENCOUNTER — Encounter: Payer: Self-pay | Admitting: *Deleted

## 2016-04-22 ENCOUNTER — Telehealth: Payer: Self-pay

## 2016-04-22 NOTE — Telephone Encounter (Signed)
-----   Message from [place] sent at 04/21/2016  5:31 PM EDT ----- npr per bcbs ded 1080 rem 1080 Max oop 4388 rem 4388 pu 80% This would apply only if your insurance company considers this as a diagnostic procedure. Most screening procedures are covered at 100%.  ----- Message ----- From: [place]ie [place], CMA Sent: 04/16/2016   9:34 AM To: [place] Slider  EGD and Colonoscopy at [facility] on 04/27/16 with [place]. Please precert for screening B01.49 family hx of colon cancer Z80.0 GERD K21.9 Nausea and vomiting R11.2.

## 2016-04-23 ENCOUNTER — Inpatient Hospital Stay: Payer: BC Managed Care – PPO | Attending: Oncology

## 2016-04-23 ENCOUNTER — Ambulatory Visit: Payer: BC Managed Care – PPO | Admitting: Family Medicine

## 2016-04-23 DIAGNOSIS — R112 Nausea with vomiting, unspecified: Secondary | ICD-10-CM | POA: Insufficient documentation

## 2016-04-23 DIAGNOSIS — F319 Bipolar disorder, unspecified: Secondary | ICD-10-CM | POA: Insufficient documentation

## 2016-04-23 DIAGNOSIS — I2510 Atherosclerotic heart disease of native coronary artery without angina pectoris: Secondary | ICD-10-CM | POA: Insufficient documentation

## 2016-04-23 DIAGNOSIS — E039 Hypothyroidism, unspecified: Secondary | ICD-10-CM | POA: Insufficient documentation

## 2016-04-23 DIAGNOSIS — Z87891 Personal history of nicotine dependence: Secondary | ICD-10-CM | POA: Insufficient documentation

## 2016-04-23 DIAGNOSIS — F419 Anxiety disorder, unspecified: Secondary | ICD-10-CM | POA: Diagnosis not present

## 2016-04-23 DIAGNOSIS — Z79899 Other long term (current) drug therapy: Secondary | ICD-10-CM | POA: Diagnosis not present

## 2016-04-23 DIAGNOSIS — E785 Hyperlipidemia, unspecified: Secondary | ICD-10-CM | POA: Diagnosis not present

## 2016-04-23 DIAGNOSIS — D509 Iron deficiency anemia, unspecified: Secondary | ICD-10-CM | POA: Insufficient documentation

## 2016-04-23 DIAGNOSIS — K219 Gastro-esophageal reflux disease without esophagitis: Secondary | ICD-10-CM | POA: Insufficient documentation

## 2016-04-23 DIAGNOSIS — R5383 Other fatigue: Secondary | ICD-10-CM | POA: Insufficient documentation

## 2016-04-23 DIAGNOSIS — R531 Weakness: Secondary | ICD-10-CM | POA: Insufficient documentation

## 2016-04-23 DIAGNOSIS — D508 Other iron deficiency anemias: Secondary | ICD-10-CM

## 2016-04-23 DIAGNOSIS — I252 Old myocardial infarction: Secondary | ICD-10-CM | POA: Diagnosis not present

## 2016-04-23 DIAGNOSIS — R12 Heartburn: Secondary | ICD-10-CM | POA: Diagnosis not present

## 2016-04-23 DIAGNOSIS — I129 Hypertensive chronic kidney disease with stage 1 through stage 4 chronic kidney disease, or unspecified chronic kidney disease: Secondary | ICD-10-CM | POA: Insufficient documentation

## 2016-04-23 DIAGNOSIS — Z800 Family history of malignant neoplasm of digestive organs: Secondary | ICD-10-CM | POA: Insufficient documentation

## 2016-04-23 DIAGNOSIS — G35 Multiple sclerosis: Secondary | ICD-10-CM | POA: Diagnosis not present

## 2016-04-23 DIAGNOSIS — Z85820 Personal history of malignant melanoma of skin: Secondary | ICD-10-CM | POA: Insufficient documentation

## 2016-04-23 DIAGNOSIS — E119 Type 2 diabetes mellitus without complications: Secondary | ICD-10-CM | POA: Insufficient documentation

## 2016-04-23 DIAGNOSIS — I255 Ischemic cardiomyopathy: Secondary | ICD-10-CM | POA: Diagnosis not present

## 2016-04-23 DIAGNOSIS — Z7982 Long term (current) use of aspirin: Secondary | ICD-10-CM | POA: Insufficient documentation

## 2016-04-23 DIAGNOSIS — I509 Heart failure, unspecified: Secondary | ICD-10-CM | POA: Insufficient documentation

## 2016-04-23 DIAGNOSIS — Z7984 Long term (current) use of oral hypoglycemic drugs: Secondary | ICD-10-CM | POA: Diagnosis not present

## 2016-04-23 DIAGNOSIS — N184 Chronic kidney disease, stage 4 (severe): Secondary | ICD-10-CM | POA: Diagnosis not present

## 2016-04-23 DIAGNOSIS — Z9884 Bariatric surgery status: Secondary | ICD-10-CM | POA: Insufficient documentation

## 2016-04-23 DIAGNOSIS — Z87442 Personal history of urinary calculi: Secondary | ICD-10-CM | POA: Insufficient documentation

## 2016-04-23 DIAGNOSIS — Z803 Family history of malignant neoplasm of breast: Secondary | ICD-10-CM | POA: Insufficient documentation

## 2016-04-23 DIAGNOSIS — Z86010 Personal history of colonic polyps: Secondary | ICD-10-CM | POA: Insufficient documentation

## 2016-04-23 DIAGNOSIS — Z8673 Personal history of transient ischemic attack (TIA), and cerebral infarction without residual deficits: Secondary | ICD-10-CM | POA: Insufficient documentation

## 2016-04-23 DIAGNOSIS — Z8669 Personal history of other diseases of the nervous system and sense organs: Secondary | ICD-10-CM | POA: Insufficient documentation

## 2016-04-23 DIAGNOSIS — G629 Polyneuropathy, unspecified: Secondary | ICD-10-CM | POA: Insufficient documentation

## 2016-04-23 LAB — CBC WITH DIFFERENTIAL/PLATELET
BASOS ABS: 0.1 10*3/uL (ref 0–0.1)
BASOS PCT: 1 %
EOS ABS: 0.3 10*3/uL (ref 0–0.7)
EOS PCT: 2 %
HCT: 37.1 % (ref 35.0–47.0)
Hemoglobin: 12.1 g/dL (ref 12.0–16.0)
LYMPHS PCT: 18 %
Lymphs Abs: 2 10*3/uL (ref 1.0–3.6)
MCH: 26.5 pg (ref 26.0–34.0)
MCHC: 32.7 g/dL (ref 32.0–36.0)
MCV: 81.1 fL (ref 80.0–100.0)
MONO ABS: 0.5 10*3/uL (ref 0.2–0.9)
Monocytes Relative: 5 %
Neutro Abs: 8.6 10*3/uL — ABNORMAL HIGH (ref 1.4–6.5)
Neutrophils Relative %: 74 %
PLATELETS: 242 10*3/uL (ref 150–440)
RBC: 4.58 MIL/uL (ref 3.80–5.20)
RDW: 13.8 % (ref 11.5–14.5)
WBC: 11.6 10*3/uL — AB (ref 3.6–11.0)

## 2016-04-23 LAB — IRON AND TIBC
IRON: 24 ug/dL — AB (ref 28–170)
SATURATION RATIOS: 10 % — AB (ref 10.4–31.8)
TIBC: 252 ug/dL (ref 250–450)
UIBC: 228 ug/dL

## 2016-04-23 LAB — FERRITIN: FERRITIN: 38 ng/mL (ref 11–307)

## 2016-04-23 NOTE — Progress Notes (Signed)
[place]  [phone]) [phone] [phone]) [phone]  ID: [name] OB: Jun 12, 1970  MR#: [id number]  GUR#:[id number]  Patient Care Team: [name], MD as PCP - General (Family Medicine) Dagoberto Ligas, MD as Consulting Physician (Nephrology) Minna Merritts, MD as Consulting Physician (Cardiology) Vladimir Crofts, MD as Consulting Physician (Neurology) Irene Shipper, MD as Consulting Physician (Gastroenterology)  CHIEF COMPLAINT: Iron deficiency anemia.  INTERVAL HISTORY: Patient is here today for labs and further evaluation and consideration of IV feraheme. She has been feeling tired and has no energy. She has been craving ice and has had a metallic taste in her mouth. She has no neurologic complaints. She denies any recent fevers or illnesses. She has had some nausea and vomiting after eating meals for the past few weeks. She denies any chest pain or shortness of breath. She is scheduled to have an colonoscopy and endoscopy on Monday with Dr. Rogers Blocker for evaluation of her nausea and vomiting. She denies any constipation, or diarrhea. She has no melena or hematochezia. She has no urinary complaints. Patient offers no further specific complaints.  REVIEW OF SYSTEMS:   Review of Systems  Constitutional: Positive for malaise/fatigue. Negative for fever and weight loss.  Respiratory: Negative.  Negative for cough and shortness of breath.   Cardiovascular: Negative.  Negative for chest pain, palpitations and leg swelling.  Gastrointestinal: Positive for heartburn, nausea and vomiting. Negative for blood in stool and melena.  Genitourinary: Negative.   Musculoskeletal: Negative.   Neurological: Positive for weakness. Negative for sensory change.  Psychiatric/Behavioral: Negative.  The patient is not nervous/anxious.     As per HPI. Otherwise, a complete review of systems is negative.  PAST MEDICAL HISTORY: Past Medical History:  Diagnosis Date  . Anemia    iron deficiency anemia  . Anxiety   . Aortic arch aneurysm ([place])   . Arthritis   . Bipolar disorder ([place])   . CAD (coronary artery disease)    a. cath 08/2003 nonobs dzs;  b. 11/2014: mLAD 85-90% s/p PCI/DES (Xience 3.25 x 23); c. cath 04/2015: 70% mD1 @ bifurcation. Patent LAD stent - > Med Rx.; d. Cath-PCI 5/17: Patent LAD stent, D1 now ~80% - PCI Resolute DES 2.25 x 12.    . Chronic systolic CHF (congestive heart failure) ([place])    a. echo 01/29/2013 EF 40-45%, basal anteroseptal & mid anteroseptal segments abnl; b echo 05/2012 EF 50-55%, mildly dilated LA, nl RVSP; c. 10/2015 Echo: EF 55-60%, mild conc LVH, no rwma.  . CKD (chronic kidney disease), stage IV ([place])   . Colon polyp   . CVA (cerebral vascular accident) ([place])    a. 06/2008-TEE nl LV fxn; b. 01/2013 - notes indicate short run of SVT at that time  . Depression   . Diabetes mellitus without complication ([place])   . Gastric ulcer 04/27/2011  . GERD (gastroesophageal reflux disease)   . Headache    Migraines  . Hyperlipidemia   . Hypertension   . Hypertensive heart disease   . Hypothyroidism   . Ischemic cardiomyopathy    a. EF prev 40-45% in 2014-->Improved to 55-60% (10/2015).  . Malignant melanoma of skin of scalp ([place])   . Morbid obesity ([place])    a. s/p gastric bypass.  . Multiple sclerosis ([place]) 2015  . Myocardial infarction 06/03/2015  . Neuromuscular disorder ([place])   . Neuropathy ([place])   . Orthostatic hypotension   . S/P drug eluting coronary stent placement 06/04/2015  .  Syncope     PAST SURGICAL HISTORY: Past Surgical History:  Procedure Laterality Date  . APPENDECTOMY    . CARDIAC CATHETERIZATION N/A 11/09/2014   Procedure: Coronary Angiography;  Surgeon: Minna Merritts, MD;  Location: [facility];  Service: Cardiovascular;  Laterality: N/A;  . CARDIAC CATHETERIZATION N/A 11/12/2014   Procedure: Coronary Stent Intervention;  Surgeon: Isaias Cowman, MD;  Location: [facility];  Service:  Cardiovascular;  Laterality: N/A;  . CARDIAC CATHETERIZATION N/A 04/18/2015   Procedure: Left Heart Cath and Coronary Angiography;  Surgeon: Minna Merritts, MD;  Location: [facility];  Service: Cardiovascular;  Laterality: N/A;  . CARDIAC CATHETERIZATION Left 06/04/2015   Procedure: Left Heart Cath and Coronary Angiography;  Surgeon: Wellington Hampshire, MD;  Location: [facility];  Service: Cardiovascular;  Laterality: Left;  . CARDIAC CATHETERIZATION N/A 06/04/2015   Procedure: Coronary Stent Intervention;  Surgeon: Wellington Hampshire, MD;  Location: [facility];  Service: Cardiovascular;  Laterality: N/A;  . CESAREAN SECTION  2001  . CORONARY ANGIOPLASTY    . DILATION AND CURETTAGE OF UTERUS    . ESOPHAGOGASTRODUODENOSCOPY (EGD) WITH PROPOFOL N/A 09/14/2014   Procedure: ESOPHAGOGASTRODUODENOSCOPY (EGD) WITH PROPOFOL;  Surgeon: Josefine Class, MD;  Location: [facility] ENDOSCOPY;  Service: Endoscopy;  Laterality: N/A;  . GASTRIC BYPASS  09/2009   [facility]   . Left Carotid to sublcavian artery bypass w/ subclavian artery ligation     a. Performed @ [place].  . MELANOMA EXCISION  2016   Dr. Evorn Gong  . [place]  2002  . RIGHT OOPHORECTOMY    . SHOULDER ARTHROSCOPY WITH OPEN ROTATOR CUFF REPAIR Right 01/07/2016   Procedure: SHOULDER ARTHROSCOPY WITH DEBRIDMENT, SUBACHROMIAL DECOMPRESSION;  Surgeon: Corky Mull, MD;  Location: [facility];  Service: Orthopedics;  Laterality: Right;  . TRIGGER FINGER RELEASE Right     [place]  . TUBAL LIGATION     R tube removed still w/ Left    FAMILY HISTORY Family History  Problem Relation Age of Onset  . Hypertension Mother   . Anxiety disorder Mother   . Depression Mother   . Bipolar disorder Mother   . Heart disease Mother   . Hyperlipidemia Mother   . Kidney disease Father   . Heart disease Father   . Hypertension Father   . Diabetes Father   . Stroke Father   . Colon cancer Father     dx  in his 61's  . Anxiety disorder Father   . Depression Father   . Skin cancer Father   . Kidney disease Sister   . Thyroid nodules Sister   . Hypertension Sister   . Hypertension Sister   . Diabetes Sister   . Hyperlipidemia Sister   . Depression Sister   . Breast cancer Maternal Aunt   . Breast cancer Maternal Aunt   . Kidney cancer Neg Hx   . Bladder Cancer Neg Hx        ADVANCED DIRECTIVES:    HEALTH MAINTENANCE: Social History  Substance Use Topics  . Smoking status: Former Smoker    Types: Cigarettes    Quit date: 08/31/1994  . Smokeless tobacco: Never Used     Comment: quit 28 years ago  . Alcohol use No     No Known Allergies  Current Outpatient Prescriptions  Medication Sig Dispense Refill  . Ascorbic Acid (VITAMIN C PO) Take by mouth daily.    Marland Kitchen  aspirin EC 81 MG tablet Take 1 tablet (81 mg total) by mouth daily.    . Biotin w/ Vitamins C & E (HAIR SKIN & NAILS GUMMIES PO) Take by mouth daily.    . citalopram (CELEXA) 40 MG tablet Take 1 tablet (40 mg total) by mouth daily. 30 tablet 5  . clopidogrel (PLAVIX) 75 MG tablet Take 1 tablet (75 mg total) by mouth daily. 30 tablet 11  . diltiazem (CARDIZEM CD) 120 MG 24 hr capsule Take 1 capsule (120 mg total) by mouth daily. 90 capsule 3  . eszopiclone (LUNESTA) 2 MG TABS tablet TAKE 1 TABLET BY MOUTH AT BEDTIME AS NEEDED FOR SLEEP 30 tablet 5  . gabapentin (NEURONTIN) 300 MG capsule Take 300 mg by mouth at bedtime. 600 mg in the morning    . isosorbide mononitrate (IMDUR) 60 MG 24 hr tablet Take 1 tablet (60 mg total) by mouth daily. 30 tablet 6  . levothyroxine (SYNTHROID, LEVOTHROID) 25 MCG tablet Take 1 tablet (25 mcg total) by mouth daily before breakfast. 30 tablet 12  . losartan (COZAAR) 50 MG tablet Take 1 tablet (50 mg total) by mouth daily. 30 tablet 12  . metFORMIN (GLUCOPHAGE) 1000 MG tablet TAKE ONE TABLET BY MOUTH TWICE DAILY 60 tablet 12  . metoprolol succinate (TOPROL-XL) 25 MG 24 hr tablet Take 1  tablet (25 mg total) by mouth daily. Take with or immediately following a meal. 30 tablet 11  . nitroGLYCERIN (NITROSTAT) 0.4 MG SL tablet Place 1 tablet (0.4 mg total) under the tongue every 5 (five) minutes as needed for chest pain. 25 tablet 6  . OVER THE COUNTER MEDICATION Take 2 tablets by mouth daily. B-12 gummies    . pantoprazole (PROTONIX) 40 MG tablet Take 1 tablet (40 mg total) by mouth 2 (two) times daily. 60 tablet 5  . promethazine (PHENERGAN) 25 MG tablet Take 1 tablet (25 mg total) by mouth every 6 (six) hours as needed for nausea or vomiting. Reported on 05/31/2015 30 tablet 3  . rosuvastatin (CRESTOR) 40 MG tablet Take 1 tablet (40 mg total) by mouth daily at 6 PM. 90 tablet 3  . sucralfate (CARAFATE) 1 GM/10ML suspension Take 10 mLs (1 g total) by mouth 4 (four) times daily. 420 mL 1  . traZODone (DESYREL) 100 MG tablet Take 2 tablets (200 mg total) by mouth at bedtime. 30 tablet 6   No current facility-administered medications for this visit.     OBJECTIVE: Vitals:   04/24/16 1152  BP: 129/79  Pulse: 63  Resp: 18  Temp: (!) 96.6 F (35.9 C)     Body mass index is 29.88 kg/m.    ECOG FS:0 - Asymptomatic  General: Well-developed, well-nourished, no acute distress. Eyes: Pink conjunctiva, anicteric sclera. Lungs: Clear to auscultation bilaterally. Heart: Regular rate and rhythm. No rubs, murmurs, or gallops. Abdomen: Soft, nontender, nondistended. No organomegaly noted, normoactive bowel sounds. Musculoskeletal: No edema, cyanosis, or clubbing. Neuro: Alert, answering all questions appropriately. Cranial nerves grossly intact. Skin: No rashes or petechiae noted. Psych: Normal affect.  LAB RESULTS:  Lab Results  Component Value Date   NA 146 (H) 01/29/2016   K 4.2 01/29/2016   CL 103 01/29/2016   CO2 25 01/29/2016   GLUCOSE 98 01/29/2016   BUN 22 01/29/2016   CREATININE 1.49 (H) 01/29/2016   CALCIUM 9.6 01/29/2016   PROT 6.9 12/13/2015   ALBUMIN 4.1  01/29/2016   AST 21 12/13/2015   ALT 16 12/13/2015  ALKPHOS 73 12/13/2015   BILITOT 0.4 12/13/2015   GFRNONAA 42 (L) 01/29/2016   GFRAA 48 (L) 01/29/2016    Lab Results  Component Value Date   WBC 11.6 (H) 04/23/2016   NEUTROABS 8.6 (H) 04/23/2016   HGB 12.1 04/23/2016   HCT 37.1 04/23/2016   MCV 81.1 04/23/2016   PLT 242 04/23/2016   Lab Results  Component Value Date   IRON 24 (L) 04/23/2016   TIBC 252 04/23/2016   IRONPCTSAT 10 (L) 04/23/2016    Lab Results  Component Value Date   FERRITIN 38 04/23/2016     STUDIES: No results found.  ASSESSMENT: Iron deficiency anemia.  PLAN:    1. Iron deficiency anemia: Possibly secondary to poor absorption given patient's history of gastric bypass surgery. Patient's hemoglobin continues to be within normal limits. Iron stores and iron saturation are trending down. Previous, her entire anemia workup was also either negative or within normal limits. Patient does not take oral iron. One dose IV feraheme today. Patient last received IV iron in March 2017. Patient will return to clinic in 3 months with repeat laboratory work and further evaluation.  2. Hypertension: Much improved. Continue monitoring and treatment per primary care. 3. Nausea/vomiting: Scheduled for Endo/Colonoscopy on Monday with Dr. Rogers Blocker.   Patient expressed understanding and was in agreement with this plan. She also understands that She can call clinic at any time with any questions, concerns, or complaints.   Faythe Casa, NP   Patient was seen and evaluated independently and I agree with the assessment and plan as dictated above. Patient is also symptomatic today. Proceed with 1 dose of Feraheme as above.  Lloyd Huger, MD 04/24/16 1:08 PM

## 2016-04-24 ENCOUNTER — Inpatient Hospital Stay (HOSPITAL_BASED_OUTPATIENT_CLINIC_OR_DEPARTMENT_OTHER): Payer: BC Managed Care – PPO | Admitting: Oncology

## 2016-04-24 ENCOUNTER — Inpatient Hospital Stay: Payer: BC Managed Care – PPO

## 2016-04-24 VITALS — BP 129/79 | HR 63 | Temp 96.6°F | Resp 18 | Wt 168.7 lb

## 2016-04-24 DIAGNOSIS — R531 Weakness: Secondary | ICD-10-CM | POA: Diagnosis not present

## 2016-04-24 DIAGNOSIS — I129 Hypertensive chronic kidney disease with stage 1 through stage 4 chronic kidney disease, or unspecified chronic kidney disease: Secondary | ICD-10-CM | POA: Diagnosis not present

## 2016-04-24 DIAGNOSIS — D508 Other iron deficiency anemias: Secondary | ICD-10-CM

## 2016-04-24 DIAGNOSIS — Z7982 Long term (current) use of aspirin: Secondary | ICD-10-CM

## 2016-04-24 DIAGNOSIS — R112 Nausea with vomiting, unspecified: Secondary | ICD-10-CM | POA: Diagnosis not present

## 2016-04-24 DIAGNOSIS — D509 Iron deficiency anemia, unspecified: Secondary | ICD-10-CM | POA: Diagnosis not present

## 2016-04-24 DIAGNOSIS — F319 Bipolar disorder, unspecified: Secondary | ICD-10-CM | POA: Diagnosis not present

## 2016-04-24 DIAGNOSIS — Z85820 Personal history of malignant melanoma of skin: Secondary | ICD-10-CM

## 2016-04-24 DIAGNOSIS — Z8673 Personal history of transient ischemic attack (TIA), and cerebral infarction without residual deficits: Secondary | ICD-10-CM

## 2016-04-24 DIAGNOSIS — Z79899 Other long term (current) drug therapy: Secondary | ICD-10-CM

## 2016-04-24 DIAGNOSIS — I509 Heart failure, unspecified: Secondary | ICD-10-CM | POA: Diagnosis not present

## 2016-04-24 DIAGNOSIS — Z800 Family history of malignant neoplasm of digestive organs: Secondary | ICD-10-CM

## 2016-04-24 DIAGNOSIS — R5383 Other fatigue: Secondary | ICD-10-CM

## 2016-04-24 DIAGNOSIS — F419 Anxiety disorder, unspecified: Secondary | ICD-10-CM

## 2016-04-24 DIAGNOSIS — I2510 Atherosclerotic heart disease of native coronary artery without angina pectoris: Secondary | ICD-10-CM

## 2016-04-24 DIAGNOSIS — Z7984 Long term (current) use of oral hypoglycemic drugs: Secondary | ICD-10-CM

## 2016-04-24 DIAGNOSIS — N184 Chronic kidney disease, stage 4 (severe): Secondary | ICD-10-CM | POA: Diagnosis not present

## 2016-04-24 DIAGNOSIS — E119 Type 2 diabetes mellitus without complications: Secondary | ICD-10-CM

## 2016-04-24 DIAGNOSIS — E785 Hyperlipidemia, unspecified: Secondary | ICD-10-CM

## 2016-04-24 DIAGNOSIS — I255 Ischemic cardiomyopathy: Secondary | ICD-10-CM

## 2016-04-24 DIAGNOSIS — R12 Heartburn: Secondary | ICD-10-CM | POA: Diagnosis not present

## 2016-04-24 DIAGNOSIS — Z803 Family history of malignant neoplasm of breast: Secondary | ICD-10-CM

## 2016-04-24 DIAGNOSIS — G35 Multiple sclerosis: Secondary | ICD-10-CM

## 2016-04-24 DIAGNOSIS — E039 Hypothyroidism, unspecified: Secondary | ICD-10-CM

## 2016-04-24 DIAGNOSIS — K219 Gastro-esophageal reflux disease without esophagitis: Secondary | ICD-10-CM

## 2016-04-24 DIAGNOSIS — G629 Polyneuropathy, unspecified: Secondary | ICD-10-CM

## 2016-04-24 DIAGNOSIS — Z86010 Personal history of colonic polyps: Secondary | ICD-10-CM

## 2016-04-24 DIAGNOSIS — Z87891 Personal history of nicotine dependence: Secondary | ICD-10-CM

## 2016-04-24 DIAGNOSIS — Z9884 Bariatric surgery status: Secondary | ICD-10-CM

## 2016-04-24 DIAGNOSIS — I252 Old myocardial infarction: Secondary | ICD-10-CM

## 2016-04-24 DIAGNOSIS — Z87442 Personal history of urinary calculi: Secondary | ICD-10-CM

## 2016-04-24 DIAGNOSIS — Z8669 Personal history of other diseases of the nervous system and sense organs: Secondary | ICD-10-CM

## 2016-04-24 MED ORDER — SODIUM CHLORIDE 0.9 % IV SOLN
510.0000 mg | Freq: Once | INTRAVENOUS | Status: AC
  Administered 2016-04-24: 510 mg via INTRAVENOUS
  Filled 2016-04-24: qty 17

## 2016-04-24 MED ORDER — SODIUM CHLORIDE 0.9 % IV SOLN
Freq: Once | INTRAVENOUS | Status: AC
  Administered 2016-04-24: 13:00:00 via INTRAVENOUS
  Filled 2016-04-24: qty 1000

## 2016-04-24 NOTE — Discharge Instructions (Signed)
General Anesthesia, Adult, Care After °These instructions provide you with information about caring for yourself after your procedure. Your health care provider may also give you more specific instructions. Your treatment has been planned according to current medical practices, but problems sometimes occur. Call your health care provider if you have any problems or questions after your procedure. °What can I expect after the procedure? °After the procedure, it is common to have: °· Vomiting. °· A sore throat. °· Mental slowness. °It is common to feel: °· Nauseous. °· Cold or shivery. °· Sleepy. °· Tired. °· Sore or achy, even in parts of your body where you did not have surgery. °Follow these instructions at home: °For at least 24 hours after the procedure:  °· Do not: °¨ Participate in activities where you could fall or become injured. °¨ Drive. °¨ Use heavy machinery. °¨ Drink alcohol. °¨ Take sleeping pills or medicines that cause drowsiness. °¨ Make important decisions or sign legal documents. °¨ Take care of children on your own. °· Rest. °Eating and drinking  °· If you vomit, drink water, juice, or soup when you can drink without vomiting. °· Drink enough fluid to keep your urine clear or pale yellow. °· Make sure you have little or no nausea before eating solid foods. °· Follow the diet recommended by your health care provider. °General instructions  °· Have a responsible adult stay with you until you are awake and alert. °· Return to your normal activities as told by your health care provider. Ask your health care provider what activities are safe for you. °· Take over-the-counter and prescription medicines only as told by your health care provider. °· If you smoke, do not smoke without supervision. °· Keep all follow-up visits as told by your health care provider. This is important. °Contact a health care provider if: °· You continue to have nausea or vomiting at home, and medicines are not helpful. °· You  cannot drink fluids or start eating again. °· You cannot urinate after 8-12 hours. °· You develop a skin rash. °· You have fever. °· You have increasing redness at the site of your procedure. °Get help right away if: °· You have difficulty breathing. °· You have chest pain. °· You have unexpected bleeding. °· You feel that you are having a life-threatening or urgent problem. °This information is not intended to replace advice given to you by your health care provider. Make sure you discuss any questions you have with your health care provider. °Document Released: 04/27/2000 Document Revised: 06/24/2015 Document Reviewed: 01/03/2015 °Elsevier Interactive Patient Education © 2017 [employer] ° °

## 2016-04-24 NOTE — Progress Notes (Signed)
Complains of feeling tired.

## 2016-04-24 NOTE — Patient Instructions (Signed)

## 2016-04-25 ENCOUNTER — Encounter: Payer: Self-pay | Admitting: Radiology

## 2016-04-25 ENCOUNTER — Ambulatory Visit
Admission: RE | Admit: 2016-04-25 | Discharge: 2016-04-25 | Disposition: A | Discharge status: Home / Self Care | Payer: BC Managed Care – PPO | Source: Ambulatory Visit | Attending: Surgery | Admitting: Surgery

## 2016-04-25 DIAGNOSIS — M7501 Adhesive capsulitis of right shoulder: Secondary | ICD-10-CM | POA: Diagnosis not present

## 2016-04-25 DIAGNOSIS — M7581 Other shoulder lesions, right shoulder: Secondary | ICD-10-CM

## 2016-04-25 DIAGNOSIS — M75111 Incomplete rotator cuff tear or rupture of right shoulder, not specified as traumatic: Secondary | ICD-10-CM | POA: Insufficient documentation

## 2016-04-25 DIAGNOSIS — M7551 Bursitis of right shoulder: Secondary | ICD-10-CM | POA: Insufficient documentation

## 2016-04-25 DIAGNOSIS — M24111 Other articular cartilage disorders, right shoulder: Secondary | ICD-10-CM

## 2016-04-27 ENCOUNTER — Ambulatory Visit: Payer: BC Managed Care – PPO | Admitting: Anesthesiology

## 2016-04-27 ENCOUNTER — Encounter
Admission: RE | Disposition: A | Discharge status: Home / Self Care | Payer: Self-pay | Source: Ambulatory Visit | Attending: Gastroenterology

## 2016-04-27 ENCOUNTER — Ambulatory Visit
Admission: RE | Admit: 2016-04-27 | Discharge: 2016-04-27 | Disposition: A | Discharge status: Home / Self Care | Payer: BC Managed Care – PPO | Source: Ambulatory Visit | Attending: Gastroenterology | Admitting: Gastroenterology

## 2016-04-27 DIAGNOSIS — Z85820 Personal history of malignant melanoma of skin: Secondary | ICD-10-CM | POA: Insufficient documentation

## 2016-04-27 DIAGNOSIS — F419 Anxiety disorder, unspecified: Secondary | ICD-10-CM | POA: Diagnosis not present

## 2016-04-27 DIAGNOSIS — I2510 Atherosclerotic heart disease of native coronary artery without angina pectoris: Secondary | ICD-10-CM | POA: Insufficient documentation

## 2016-04-27 DIAGNOSIS — Z1211 Encounter for screening for malignant neoplasm of colon: Secondary | ICD-10-CM | POA: Insufficient documentation

## 2016-04-27 DIAGNOSIS — I252 Old myocardial infarction: Secondary | ICD-10-CM | POA: Diagnosis not present

## 2016-04-27 DIAGNOSIS — K219 Gastro-esophageal reflux disease without esophagitis: Secondary | ICD-10-CM | POA: Insufficient documentation

## 2016-04-27 DIAGNOSIS — Z808 Family history of malignant neoplasm of other organs or systems: Secondary | ICD-10-CM | POA: Insufficient documentation

## 2016-04-27 DIAGNOSIS — I130 Hypertensive heart and chronic kidney disease with heart failure and stage 1 through stage 4 chronic kidney disease, or unspecified chronic kidney disease: Secondary | ICD-10-CM | POA: Diagnosis not present

## 2016-04-27 DIAGNOSIS — E039 Hypothyroidism, unspecified: Secondary | ICD-10-CM | POA: Insufficient documentation

## 2016-04-27 DIAGNOSIS — F319 Bipolar disorder, unspecified: Secondary | ICD-10-CM | POA: Insufficient documentation

## 2016-04-27 DIAGNOSIS — R1013 Epigastric pain: Secondary | ICD-10-CM | POA: Diagnosis present

## 2016-04-27 DIAGNOSIS — F329 Major depressive disorder, single episode, unspecified: Secondary | ICD-10-CM | POA: Insufficient documentation

## 2016-04-27 DIAGNOSIS — Z9884 Bariatric surgery status: Secondary | ICD-10-CM | POA: Diagnosis not present

## 2016-04-27 DIAGNOSIS — E1122 Type 2 diabetes mellitus with diabetic chronic kidney disease: Secondary | ICD-10-CM | POA: Diagnosis not present

## 2016-04-27 DIAGNOSIS — I5022 Chronic systolic (congestive) heart failure: Secondary | ICD-10-CM | POA: Diagnosis not present

## 2016-04-27 DIAGNOSIS — N184 Chronic kidney disease, stage 4 (severe): Secondary | ICD-10-CM | POA: Insufficient documentation

## 2016-04-27 DIAGNOSIS — Z800 Family history of malignant neoplasm of digestive organs: Secondary | ICD-10-CM | POA: Insufficient documentation

## 2016-04-27 DIAGNOSIS — Z955 Presence of coronary angioplasty implant and graft: Secondary | ICD-10-CM | POA: Insufficient documentation

## 2016-04-27 DIAGNOSIS — Z7982 Long term (current) use of aspirin: Secondary | ICD-10-CM | POA: Insufficient documentation

## 2016-04-27 DIAGNOSIS — I719 Aortic aneurysm of unspecified site, without rupture: Secondary | ICD-10-CM | POA: Diagnosis not present

## 2016-04-27 DIAGNOSIS — M1990 Unspecified osteoarthritis, unspecified site: Secondary | ICD-10-CM | POA: Insufficient documentation

## 2016-04-27 DIAGNOSIS — Z8249 Family history of ischemic heart disease and other diseases of the circulatory system: Secondary | ICD-10-CM | POA: Insufficient documentation

## 2016-04-27 DIAGNOSIS — I255 Ischemic cardiomyopathy: Secondary | ICD-10-CM | POA: Diagnosis not present

## 2016-04-27 DIAGNOSIS — E1140 Type 2 diabetes mellitus with diabetic neuropathy, unspecified: Secondary | ICD-10-CM | POA: Insufficient documentation

## 2016-04-27 DIAGNOSIS — Z4802 Encounter for removal of sutures: Secondary | ICD-10-CM | POA: Insufficient documentation

## 2016-04-27 DIAGNOSIS — Z6829 Body mass index (BMI) 29.0-29.9, adult: Secondary | ICD-10-CM | POA: Insufficient documentation

## 2016-04-27 DIAGNOSIS — Z86010 Personal history of colonic polyps: Secondary | ICD-10-CM | POA: Insufficient documentation

## 2016-04-27 DIAGNOSIS — Z7902 Long term (current) use of antithrombotics/antiplatelets: Secondary | ICD-10-CM | POA: Insufficient documentation

## 2016-04-27 DIAGNOSIS — Z79899 Other long term (current) drug therapy: Secondary | ICD-10-CM | POA: Insufficient documentation

## 2016-04-27 DIAGNOSIS — Z7984 Long term (current) use of oral hypoglycemic drugs: Secondary | ICD-10-CM | POA: Insufficient documentation

## 2016-04-27 DIAGNOSIS — Z803 Family history of malignant neoplasm of breast: Secondary | ICD-10-CM | POA: Insufficient documentation

## 2016-04-27 DIAGNOSIS — Z87891 Personal history of nicotine dependence: Secondary | ICD-10-CM | POA: Diagnosis not present

## 2016-04-27 DIAGNOSIS — Z8673 Personal history of transient ischemic attack (TIA), and cerebral infarction without residual deficits: Secondary | ICD-10-CM | POA: Diagnosis not present

## 2016-04-27 DIAGNOSIS — Z823 Family history of stroke: Secondary | ICD-10-CM | POA: Insufficient documentation

## 2016-04-27 HISTORY — DX: Presence of coronary angioplasty implant and graft: Z95.5

## 2016-04-27 HISTORY — PX: COLONOSCOPY WITH PROPOFOL: SHX5780

## 2016-04-27 HISTORY — PX: ESOPHAGOGASTRODUODENOSCOPY (EGD) WITH PROPOFOL: SHX5813

## 2016-04-27 LAB — GLUCOSE, CAPILLARY
Glucose-Capillary: 89 mg/dL (ref 65–99)
Glucose-Capillary: 100 mg/dL — ABNORMAL HIGH (ref 65–99)

## 2016-04-27 SURGERY — COLONOSCOPY WITH PROPOFOL
Anesthesia: Monitor Anesthesia Care | Wound class: Contaminated

## 2016-04-27 MED ORDER — STERILE WATER FOR IRRIGATION IR SOLN
Status: DC | PRN
  Administered 2016-04-27: 10:00:00

## 2016-04-27 MED ORDER — PROPOFOL 10 MG/ML IV BOLUS
INTRAVENOUS | Status: DC | PRN
  Administered 2016-04-27: 10 mg via INTRAVENOUS
  Administered 2016-04-27: 20 mg via INTRAVENOUS
  Administered 2016-04-27: 10 mg via INTRAVENOUS
  Administered 2016-04-27 (×2): 20 mg via INTRAVENOUS
  Administered 2016-04-27: 60 mg via INTRAVENOUS
  Administered 2016-04-27 (×3): 20 mg via INTRAVENOUS

## 2016-04-27 MED ORDER — GLYCOPYRROLATE 0.2 MG/ML IJ SOLN
INTRAMUSCULAR | Status: DC | PRN
  Administered 2016-04-27: 0.1 mg via INTRAVENOUS

## 2016-04-27 MED ORDER — ACETAMINOPHEN 325 MG PO TABS: 325.0000 mg | ORAL_TABLET | ORAL | Status: DC | PRN

## 2016-04-27 MED ORDER — ACETAMINOPHEN 160 MG/5ML PO SOLN: 325.0000 mg | ORAL | Status: DC | PRN

## 2016-04-27 MED ORDER — LACTATED RINGERS IV SOLN
INTRAVENOUS | Status: DC
  Administered 2016-04-27 (×3): via INTRAVENOUS

## 2016-04-27 MED ORDER — LIDOCAINE HCL (CARDIAC) 20 MG/ML IV SOLN
INTRAVENOUS | Status: DC | PRN
  Administered 2016-04-27: 20 mg via INTRAVENOUS

## 2016-04-27 SURGICAL SUPPLY — 35 items
BALLN DILATOR 10-12 8 (BALLOONS)
BALLN DILATOR 12-15 8 (BALLOONS)
BALLN DILATOR 15-18 8 (BALLOONS)
BALLN DILATOR CRE 0-12 8 (BALLOONS)
BALLN DILATOR ESOPH 8 10 CRE (MISCELLANEOUS) IMPLANT
BALLOON DILATOR 12-15 8 (BALLOONS) IMPLANT
BALLOON DILATOR 15-18 8 (BALLOONS) IMPLANT
BALLOON DILATOR CRE 0-12 8 (BALLOONS) IMPLANT
BLOCK BITE 60FR ADLT L/F GRN (MISCELLANEOUS) ×3 IMPLANT
CANISTER SUCT 1200ML W/VALVE (MISCELLANEOUS) ×3 IMPLANT
CLIP HMST 235XBRD CATH ROT (MISCELLANEOUS) IMPLANT
CLIP RESOLUTION 360 11X235 (MISCELLANEOUS)
FCP ESCP3.2XJMB 240X2.8X (MISCELLANEOUS)
FORCEPS BIOP RAD 4 LRG CAP 4 (CUTTING FORCEPS) ×2 IMPLANT
FORCEPS BIOP RJ4 240 W/NDL (MISCELLANEOUS)
FORCEPS ESCP3.2XJMB 240X2.8X (MISCELLANEOUS) IMPLANT
GOWN CVR UNV OPN BCK APRN NK (MISCELLANEOUS) ×2 IMPLANT
GOWN ISOL THUMB LOOP REG UNIV (MISCELLANEOUS) ×6
INJECTOR VARIJECT VIN23 (MISCELLANEOUS) IMPLANT
KIT DEFENDO VALVE AND CONN (KITS) IMPLANT
KIT ENDO PROCEDURE OLY (KITS) ×3 IMPLANT
MARKER SPOT ENDO TATTOO 5ML (MISCELLANEOUS) IMPLANT
PAD GROUND ADULT SPLIT (MISCELLANEOUS) IMPLANT
PROBE APC STR FIRE (PROBE) IMPLANT
RETRIEVER NET PLAT FOOD (MISCELLANEOUS) IMPLANT
RETRIEVER NET ROTH 2.5X230 LF (MISCELLANEOUS) IMPLANT
SNARE SHORT THROW 13M SML OVAL (MISCELLANEOUS) IMPLANT
SNARE SHORT THROW 30M LRG OVAL (MISCELLANEOUS) IMPLANT
SNARE SNG USE RND 15MM (INSTRUMENTS) IMPLANT
SPOT EX ENDOSCOPIC TATTOO (MISCELLANEOUS)
SYR INFLATION 60ML (SYRINGE) IMPLANT
TRAP ETRAP POLY (MISCELLANEOUS) IMPLANT
VARIJECT INJECTOR VIN23 (MISCELLANEOUS)
WATER STERILE IRR 250ML POUR (IV SOLUTION) ×3 IMPLANT
WIRE CRE 18-20MM 8CM F G (MISCELLANEOUS) IMPLANT

## 2016-04-27 NOTE — Anesthesia Preprocedure Evaluation (Signed)
Anesthesia Evaluation  Patient identified by MRN, date of birth, ID band Patient awake    Reviewed: Allergy & Precautions, H&P , NPO status , Patient's Chart, lab work & pertinent test results  Airway Mallampati: II  TM Distance: >3 FB Neck ROM: full    Dental no notable dental hx.    Pulmonary former smoker,    Pulmonary exam normal        Cardiovascular hypertension, + angina + CAD, + Past MI, + Peripheral Vascular Disease and +CHF  Normal cardiovascular exam     Neuro/Psych PSYCHIATRIC DISORDERS CVA    GI/Hepatic GERD  ,  Endo/Other  diabetesHypothyroidism   Renal/GU Renal disease     Musculoskeletal   Abdominal   Peds  Hematology   Anesthesia Other Findings   Reproductive/Obstetrics                             Anesthesia Physical Anesthesia Plan  ASA: III  Anesthesia Plan: MAC   Post-op Pain Management:    Induction:   Airway Management Planned:   Additional Equipment:   Intra-op Plan:   Post-operative Plan:   Informed Consent: I have reviewed the patients History and Physical, chart, labs and discussed the procedure including the risks, benefits and alternatives for the proposed anesthesia with the patient or authorized representative who has indicated his/her understanding and acceptance.     Plan Discussed with:   Anesthesia Plan Comments:         Anesthesia Quick Evaluation

## 2016-04-27 NOTE — H&P (Signed)
Kendra Lame, MD [facility] [address], [place], [place] [address] Phone:[phone] Fax : [phone]  Primary Care Physician:  [name], MD Primary Gastroenterologist:  Dr. Allen Norris  Pre-Procedure History & Physical: HPI:  [name] is a 46 y.o. female is here for an endoscopy and colonoscopy.   Past Medical History:  Diagnosis Date  . Anemia    iron deficiency anemia  . Anxiety   . Aortic arch aneurysm ([place])   . Arthritis   . Bipolar disorder ([place])   . CAD (coronary artery disease)    a. cath 08/2003 nonobs dzs;  b. 11/2014: mLAD 85-90% s/p PCI/DES (Xience 3.25 x 23); c. cath 04/2015: 70% mD1 @ bifurcation. Patent LAD stent - > Med Rx.; d. Cath-PCI 5/17: Patent LAD stent, D1 now ~80% - PCI Resolute DES 2.25 x 12.    . Chronic systolic CHF (congestive heart failure) ([place])    a. echo 01/29/2013 EF 40-45%, basal anteroseptal & mid anteroseptal segments abnl; b echo 05/2012 EF 50-55%, mildly dilated LA, nl RVSP; c. 10/2015 Echo: EF 55-60%, mild conc LVH, no rwma.  . CKD (chronic kidney disease), stage IV ([place])   . Colon polyp   . CVA (cerebral vascular accident) ([place])    a. 06/2008-TEE nl LV fxn; b. 01/2013 - notes indicate short run of SVT at that time  . Depression   . Diabetes mellitus without complication ([place])   . Gastric ulcer 04/27/2011  . GERD (gastroesophageal reflux disease)   . Headache    Migraines  . Hyperlipidemia   . Hypertension   . Hypertensive heart disease   . Hypothyroidism   . Ischemic cardiomyopathy    a. EF prev 40-45% in 2014-->Improved to 55-60% (10/2015).  . Malignant melanoma of skin of scalp ([place])   . Morbid obesity ([place])    a. s/p gastric bypass.  . Multiple sclerosis ([place]) 2015  . Myocardial infarction 06/03/2015  . Neuromuscular disorder ([place])   . Neuropathy ([place])   . Orthostatic hypotension   . S/P drug eluting coronary stent placement 06/04/2015  . Syncope     Past Surgical History:  Procedure Laterality Date  .  APPENDECTOMY    . CARDIAC CATHETERIZATION N/A 11/09/2014   Procedure: Coronary Angiography;  Surgeon: Minna Merritts, MD;  Location: [place] [facility];  Service: Cardiovascular;  Laterality: N/A;  . CARDIAC CATHETERIZATION N/A 11/12/2014   Procedure: Coronary Stent Intervention;  Surgeon: Isaias Cowman, MD;  Location: [place] [facility];  Service: Cardiovascular;  Laterality: N/A;  . CARDIAC CATHETERIZATION N/A 04/18/2015   Procedure: Left Heart Cath and Coronary Angiography;  Surgeon: Minna Merritts, MD;  Location: [place] [facility];  Service: Cardiovascular;  Laterality: N/A;  . CARDIAC CATHETERIZATION Left 06/04/2015   Procedure: Left Heart Cath and Coronary Angiography;  Surgeon: Wellington Hampshire, MD;  Location: [place] [facility];  Service: Cardiovascular;  Laterality: Left;  . CARDIAC CATHETERIZATION N/A 06/04/2015   Procedure: Coronary Stent Intervention;  Surgeon: Wellington Hampshire, MD;  Location: [place] [facility];  Service: Cardiovascular;  Laterality: N/A;  . CESAREAN SECTION  2001  . CORONARY ANGIOPLASTY    . DILATION AND CURETTAGE OF UTERUS    . ESOPHAGOGASTRODUODENOSCOPY (EGD) WITH PROPOFOL N/A 09/14/2014   Procedure: ESOPHAGOGASTRODUODENOSCOPY (EGD) WITH PROPOFOL;  Surgeon: Josefine Class, MD;  Location: [facility] ENDOSCOPY;  Service: Endoscopy;  Laterality: N/A;  . GASTRIC BYPASS  09/2009   [facility]   . Left Carotid to  sublcavian artery bypass w/ subclavian artery ligation     a. Performed @ [place].  . MELANOMA EXCISION  2016   Dr. Evorn Gong  . [place]  2002  . RIGHT OOPHORECTOMY    . SHOULDER ARTHROSCOPY WITH OPEN ROTATOR CUFF REPAIR Right 01/07/2016   Procedure: SHOULDER ARTHROSCOPY WITH DEBRIDMENT, SUBACHROMIAL DECOMPRESSION;  Surgeon: Corky Mull, MD;  Location: [facility] ORS;  Service: Orthopedics;  Laterality: Right;  . TRIGGER FINGER RELEASE Right     Middle Finger  . TUBAL LIGATION     R tube removed still w/ Left    Prior  to Admission medications   Medication Sig Start Date End Date Taking? Authorizing Provider  Ascorbic Acid (VITAMIN C PO) Take by mouth daily.   Yes Historical Provider, MD  aspirin EC 81 MG tablet Take 1 tablet (81 mg total) by mouth daily. 02/19/15  Yes Hildred Priest, MD  Biotin w/ Vitamins C & E (HAIR SKIN & NAILS GUMMIES PO) Take by mouth daily.   Yes Historical Provider, MD  citalopram (CELEXA) 40 MG tablet Take 1 tablet (40 mg total) by mouth daily. 11/15/15  Yes [name], MD  diltiazem (CARDIZEM CD) 120 MG 24 hr capsule Take 1 capsule (120 mg total) by mouth daily. 01/31/16  Yes Minna Merritts, MD  eszopiclone (LUNESTA) 2 MG TABS tablet TAKE 1 TABLET BY MOUTH AT BEDTIME AS NEEDED FOR SLEEP 03/01/16  Yes [name], MD  gabapentin (NEURONTIN) 300 MG capsule Take 300 mg by mouth at bedtime. 600 mg in the morning   Yes Historical Provider, MD  isosorbide mononitrate (IMDUR) 60 MG 24 hr tablet Take 1 tablet (60 mg total) by mouth daily. 12/19/15  Yes Minna Merritts, MD  levothyroxine (SYNTHROID, LEVOTHROID) 25 MCG tablet Take 1 tablet (25 mcg total) by mouth daily before breakfast. 02/28/16  Yes [name], MD  losartan (COZAAR) 50 MG tablet Take 1 tablet (50 mg total) by mouth daily. 01/14/16  Yes [name], MD  metFORMIN (GLUCOPHAGE) 1000 MG tablet TAKE ONE TABLET BY MOUTH TWICE DAILY 03/19/16  Yes [name], MD  metoprolol succinate (TOPROL-XL) 25 MG 24 hr tablet Take 1 tablet (25 mg total) by mouth daily. Take with or immediately following a meal. 02/27/16 02/26/17 Yes Minna Merritts, MD  nitroGLYCERIN (NITROSTAT) 0.4 MG SL tablet Place 1 tablet (0.4 mg total) under the tongue every 5 (five) minutes as needed for chest pain. 04/30/15  Yes Minna Merritts, MD  OVER THE COUNTER MEDICATION Take 2 tablets by mouth daily. B-12 gummies   Yes Historical Provider, MD  promethazine (PHENERGAN) 25 MG tablet Take 1 tablet (25 mg total) by mouth every 6 (six) hours  as needed for nausea or vomiting. Reported on 05/31/2015 11/15/15  Yes [name], MD  rosuvastatin (CRESTOR) 40 MG tablet Take 1 tablet (40 mg total) by mouth daily at 6 PM. 04/30/15  Yes Minna Merritts, MD  traZODone (DESYREL) 100 MG tablet Take 2 tablets (200 mg total) by mouth at bedtime. 12/02/15  Yes [name], MD  clopidogrel (PLAVIX) 75 MG tablet Take 1 tablet (75 mg total) by mouth daily. 02/27/16   Minna Merritts, MD  pantoprazole (PROTONIX) 40 MG tablet Take 1 tablet (40 mg total) by mouth 2 (two) times daily. 04/18/15   [place] Lighter, MD  sucralfate (CARAFATE) 1 GM/10ML suspension Take 10 mLs (1 g total) by mouth 4 (four) times daily. 04/09/16   Latarshia [place]  Allen Norris, MD    Allergies as of 04/16/2016  . (No Known Allergies)    Family History  Problem Relation Age of Onset  . Hypertension Mother   . Anxiety disorder Mother   . Depression Mother   . Bipolar disorder Mother   . Heart disease Mother   . Hyperlipidemia Mother   . Kidney disease Father   . Heart disease Father   . Hypertension Father   . Diabetes Father   . Stroke Father   . Colon cancer Father     dx in his 70's  . Anxiety disorder Father   . Depression Father   . Skin cancer Father   . Kidney disease Sister   . Thyroid nodules Sister   . Hypertension Sister   . Hypertension Sister   . Diabetes Sister   . Hyperlipidemia Sister   . Depression Sister   . Breast cancer Maternal Aunt   . Breast cancer Maternal Aunt   . Kidney cancer Neg Hx   . Bladder Cancer Neg Hx     Social History   Social History  . Marital status: Married    Spouse name: N/A  . Number of children: 1  . Years of education: N/A   Occupational History  . 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 Disabled through 08/02/2013 per forms 02/03/2013   Social History Main Topics  . Smoking status: Former Smoker    Types: Cigarettes    Quit date: 08/31/1994    . Smokeless tobacco: Never Used     Comment: quit 28 years ago  . Alcohol use No  . Drug use: No  . Sexual activity: Not Currently    Birth control/ protection: None   Other Topics Concern  . 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    Review of Systems: See HPI, otherwise negative ROS  Physical Exam: Ht 5\' 3"  (1.6 m)   Wt 167 lb (75.8 kg)   BMI 29.58 kg/m  General:   Alert,  pleasant and cooperative in NAD Head:  Normocephalic and atraumatic. Neck:  Supple; no masses or thyromegaly. Lungs:  Clear throughout to auscultation.    Heart:  Regular rate and rhythm. Abdomen:  Soft, nontender and nondistended. Normal bowel sounds, without guarding, and without rebound.   Neurologic:  Alert and  oriented x4;  grossly normal neurologically.  Impression/Plan: [place] is here for an endoscopy and colonoscopy to be performed for history of polyps and epigastric pain with nausea and vomiting.  Risks, benefits, limitations, and alternatives regarding  endoscopy and colonoscopy have been reviewed with the patient.  Questions have been answered.  All parties agreeable.   Kendra Lame, MD  04/27/2016, 9:42 AM

## 2016-04-27 NOTE — Anesthesia Postprocedure Evaluation (Signed)
Anesthesia Post Note  Patient: [name]  Procedure(s) Performed: Procedure(s) (LRB): COLONOSCOPY WITH PROPOFOL (N/A) ESOPHAGOGASTRODUODENOSCOPY (EGD) WITH PROPOFOL (N/A)  Patient location during evaluation: PACU Anesthesia Type: MAC Level of consciousness: awake and alert and oriented Pain management: satisfactory to patient Vital Signs Assessment: post-procedure vital signs reviewed and stable Respiratory status: spontaneous breathing, nonlabored ventilation and respiratory function stable Cardiovascular status: blood pressure returned to baseline and stable Postop Assessment: Adequate PO intake and No signs of nausea or vomiting Anesthetic complications: no    Raliegh Ip

## 2016-04-27 NOTE — Op Note (Signed)
[facility] Gastroenterology Patient Name: [name] Procedure Date: 04/27/2016 9:45 AM MRN: [id number] Account #: [id number] Date of Birth: 1970/07/19 Admit Type: Outpatient Age: 46 Room: [facility] OR ROOM 01 Gender: Female Note Status: Finalized Procedure:            Upper GI endoscopy Indications:          Epigastric abdominal pain Providers:            [name] MD, MD Referring MD:         Kirstie Peri. Caryn Section, MD (Referring MD) Medicines:            Propofol per Anesthesia Complications:        No immediate complications. Procedure:            Pre-Anesthesia Assessment:                       - Prior to the procedure, a History and Physical was                        performed, and patient medications and allergies were                        reviewed. The patient's tolerance of previous                        anesthesia was also reviewed. The risks and benefits of                        the procedure and the sedation options and risks were                        discussed with the patient. All questions were                        answered, and informed consent was obtained. Prior                        Anticoagulants: The patient has taken no previous                        anticoagulant or antiplatelet agents. ASA Grade                        Assessment: III - A patient with severe systemic                        disease. After reviewing the risks and benefits, the                        patient was deemed in satisfactory condition to undergo                        the procedure.                       After obtaining informed consent, the endoscope was                        passed under direct vision. Throughout the procedure,  the patient's blood pressure, pulse, and oxygen                        saturations were monitored continuously. The Olympus                        [id number]-HQ190 Endoscope (S#. [id number]) was introduced            through the mouth, and advanced to the jejunum. The                        upper GI endoscopy was accomplished without difficulty.                        The patient tolerated the procedure well. Findings:      The examined esophagus was normal.      Evidence of a gastric bypass was found. A gastric pouch with a normal       size was found containing staples. The staple line appeared intact. The       gastrojejunal anastomosis was characterized by healthy appearing mucosa.       This was traversed. The pouch-to-jejunum limb was characterized by       healthy appearing mucosa. The duodenum-to-jejunum limb was examined.       Removal of a staple was accomplished with a regular forceps.      The examined jejunum was normal. Impression:           - Normal esophagus.                       - Gastric bypass with a normal-sized pouch and intact                        staple line. Gastrojejunal anastomosis characterized by                        healthy appearing mucosa.                       - Normal examined jejunum.                       - Three staples removed.                       - No cause for the patients symptoms seen. Recommendation:       - Perform a colonoscopy today. Procedure Code(s):    --- Professional ---                       [phone], Esophagogastroduodenoscopy, flexible, transoral;                        with removal of foreign body(s) Diagnosis Code(s):    --- Professional ---                       R10.13, Epigastric pain                       Z98.84, Bariatric surgery status CPT copyright 2016 American Medical Association. All rights reserved. The codes documented in this report are preliminary and upon coder review may  be revised to  meet current compliance requirements. [name] MD, MD 04/27/2016 10:02:50 AM This report has been signed electronically. Number of Addenda: 0 Note Initiated On: 04/27/2016 9:45 AM      [facility]

## 2016-04-27 NOTE — Anesthesia Procedure Notes (Signed)
Procedure Name: MAC Performed by: Lind Guest Pre-anesthesia Checklist: Patient identified, Emergency Drugs available, Suction available, Patient being monitored and Timeout performed Patient Re-evaluated:Patient Re-evaluated prior to inductionOxygen Delivery Method: Nasal cannula

## 2016-04-27 NOTE — Op Note (Signed)
[facility]astroenterology Patient Name: [name] Procedure Date: 04/27/2016 9:38 AM MRN: [id number] Account #: [id number] Date of Birth: 12/01/1970 Admit Type: Outpatient Age: 46 Room: [facility] OR ROOM 01 Gender: Female Note Status: Finalized Procedure:            Colonoscopy Indications:          High risk colon cancer surveillance: Personal history                        of colonic polyps Providers:            [name] MD, MD Medicines:            Propofol per Anesthesia Complications:        No immediate complications. Procedure:            Pre-Anesthesia Assessment:                       - Prior to the procedure, a History and Physical was                        performed, and patient medications and allergies were                        reviewed. The patient's tolerance of previous                        anesthesia was also reviewed. The risks and benefits of                        the procedure and the sedation options and risks were                        discussed with the patient. All questions were                        answered, and informed consent was obtained. Prior                        Anticoagulants: The patient has taken no previous                        anticoagulant or antiplatelet agents. ASA Grade                        Assessment: III - A patient with severe systemic                        disease. After reviewing the risks and benefits, the                        patient was deemed in satisfactory condition to undergo                        the procedure.                       After obtaining informed consent, the colonoscope was                        passed under direct vision. Throughout  the procedure,                        the patient's blood pressure, pulse, and oxygen                        saturations were monitored continuously. The [place] [phone]) was introduced through the                        anus and advanced to the the cecum, identified by                        appendiceal orifice and ileocecal valve. The                        colonoscopy was performed without difficulty. The                        patient tolerated the procedure well. The quality of                        the bowel preparation was good. Findings:      The perianal and digital rectal examinations were normal.      The entire examined colon appeared normal. Impression:           - The entire examined colon is normal.                       - No specimens collected. Recommendation:       - Discharge patient to home.                       - Resume previous diet.                       - Continue present medications.                       - Repeat colonoscopy in 5 years for surveillance. Procedure Code(s):    --- Professional ---                       [phone], Colonoscopy, flexible; diagnostic, including                        collection of specimen(s) by brushing or washing, when                        performed (separate procedure) Diagnosis Code(s):    --- Professional ---                       Z86.010, Personal history of colonic polyps CPT copyright 2016 American Medical Association. All rights reserved. The codes documented in this report are preliminary and upon coder review may  be revised to meet current compliance requirements. [name] MD, MD 04/27/2016 10:13:39 AM This report has been signed electronically. Number of Addenda: 0 Note Initiated On: 04/27/2016 9:38 AM Scope Withdrawal Time: 0 hours 6 minutes 44 seconds  Total Procedure Duration: 0 hours 8  minutes 15 seconds       [facility]

## 2016-04-27 NOTE — Transfer of Care (Signed)
Immediate Anesthesia Transfer of Care Note  Patient: [name]  Procedure(s) Performed: Procedure(s) with comments: COLONOSCOPY WITH PROPOFOL (N/A) ESOPHAGOGASTRODUODENOSCOPY (EGD) WITH PROPOFOL (N/A) - Diabetic - oral meds  Patient Location: PACU  Anesthesia Type: MAC  Level of Consciousness: awake, alert  and patient cooperative  Airway and Oxygen Therapy: Patient Spontanous Breathing and Patient connected to supplemental oxygen  Post-op Assessment: Post-op Vital signs reviewed, Patient's Cardiovascular Status Stable, Respiratory Function Stable, Patent Airway and No signs of Nausea or vomiting  Post-op Vital Signs: Reviewed and stable  Complications: No apparent anesthesia complications

## 2016-04-28 ENCOUNTER — Encounter: Payer: Self-pay | Admitting: Gastroenterology

## 2016-05-11 ENCOUNTER — Other Ambulatory Visit: Payer: Self-pay | Admitting: Family Medicine

## 2016-05-11 MED ORDER — PANTOPRAZOLE SODIUM 40 MG PO TBEC: 40.0000 mg | DELAYED_RELEASE_TABLET | Freq: Two times a day (BID) | ORAL | 5 refills | Status: DC

## 2016-05-11 NOTE — Telephone Encounter (Signed)
[facility] faxed a request for the following medication for patient.  Thanks [facility]  pantoprazole (PROTONIX) 40 MG tablet  Take 1 (one) tablet by mouth twice daily.

## 2016-05-16 IMAGING — CR DG CHEST 2V
1 series · 2 of 2 positions shown · non-contrast
Comparison: None.

CLINICAL DATA: Weakness

EXAM:
CHEST  2 VIEW

[Series 1: dg chest 2 view · 0.14mm/px · 2 of 2 slices shown]
[im 1/2]
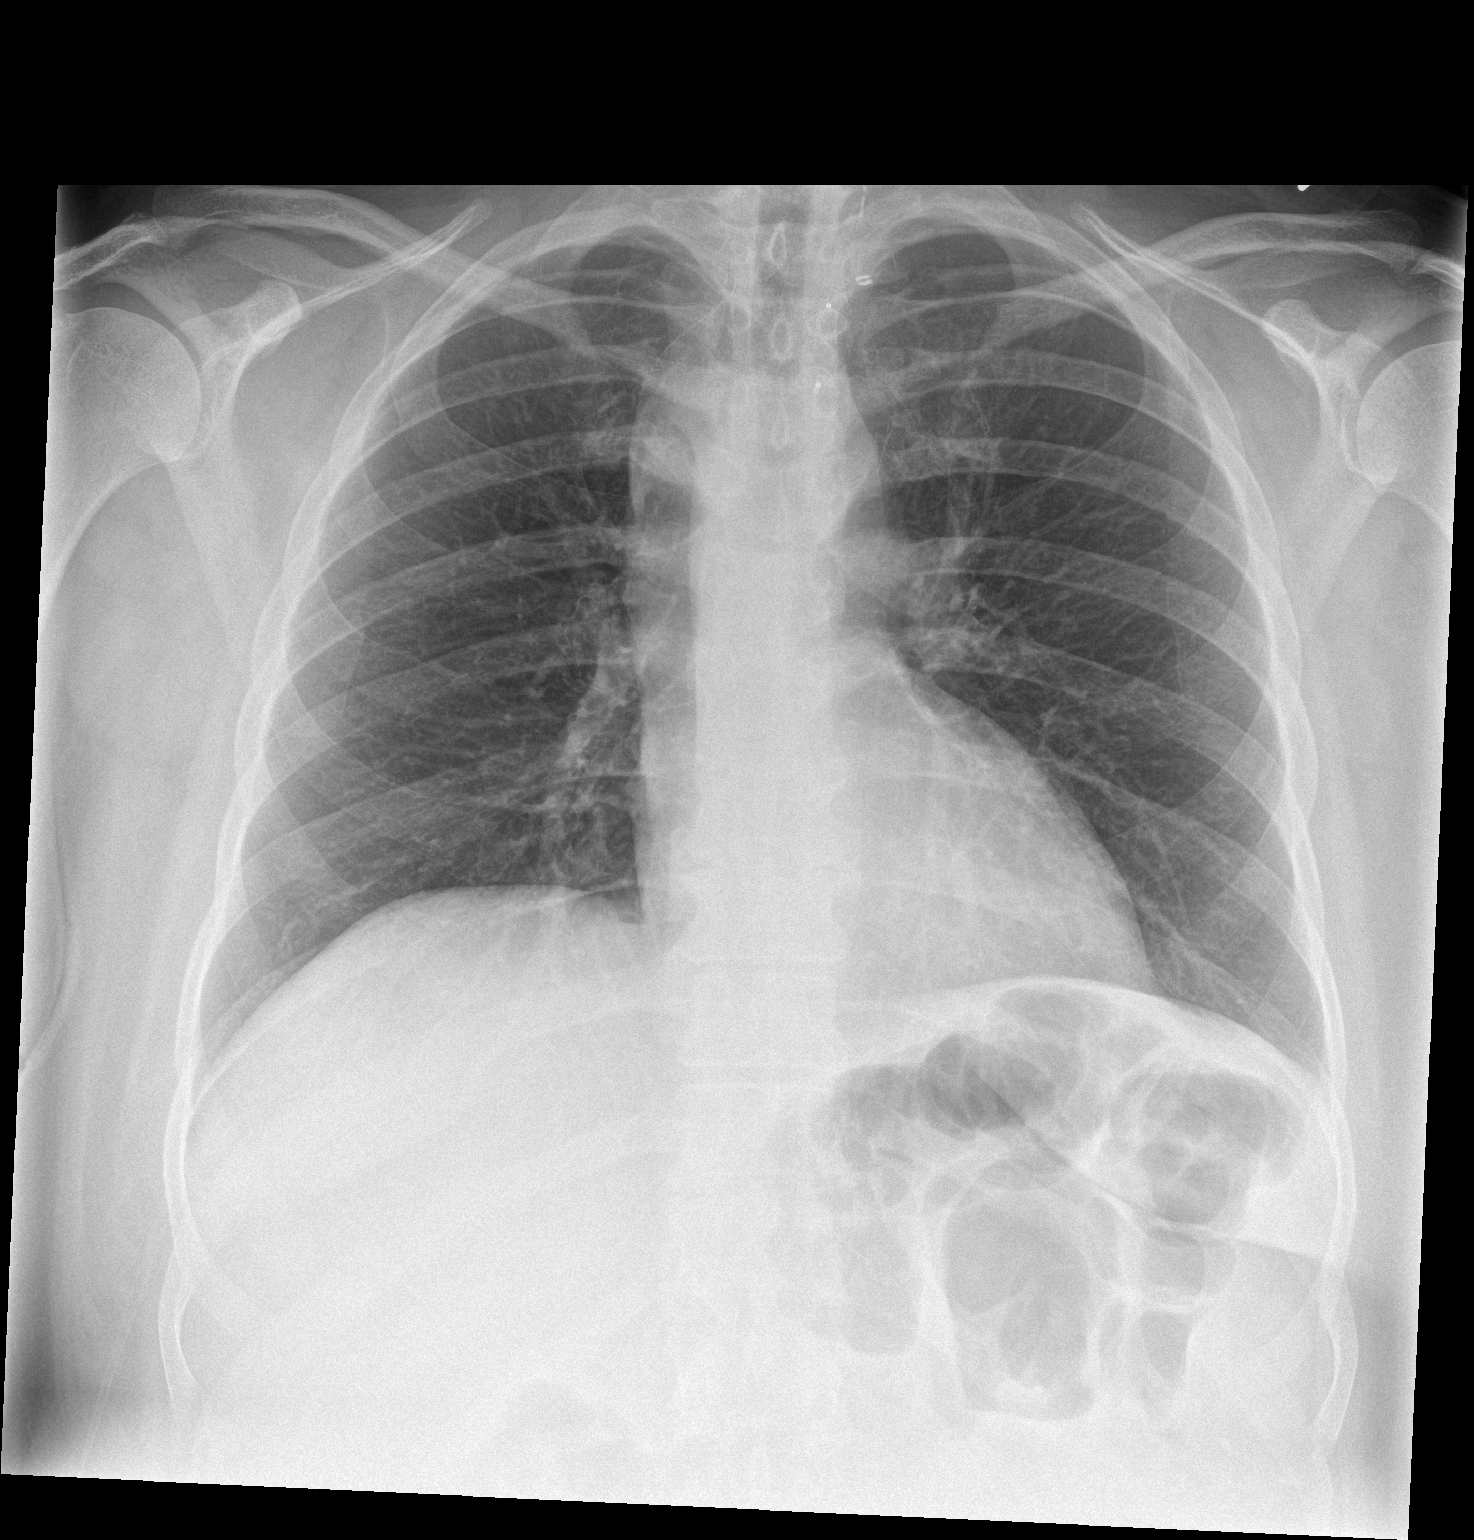
[im 2/2]
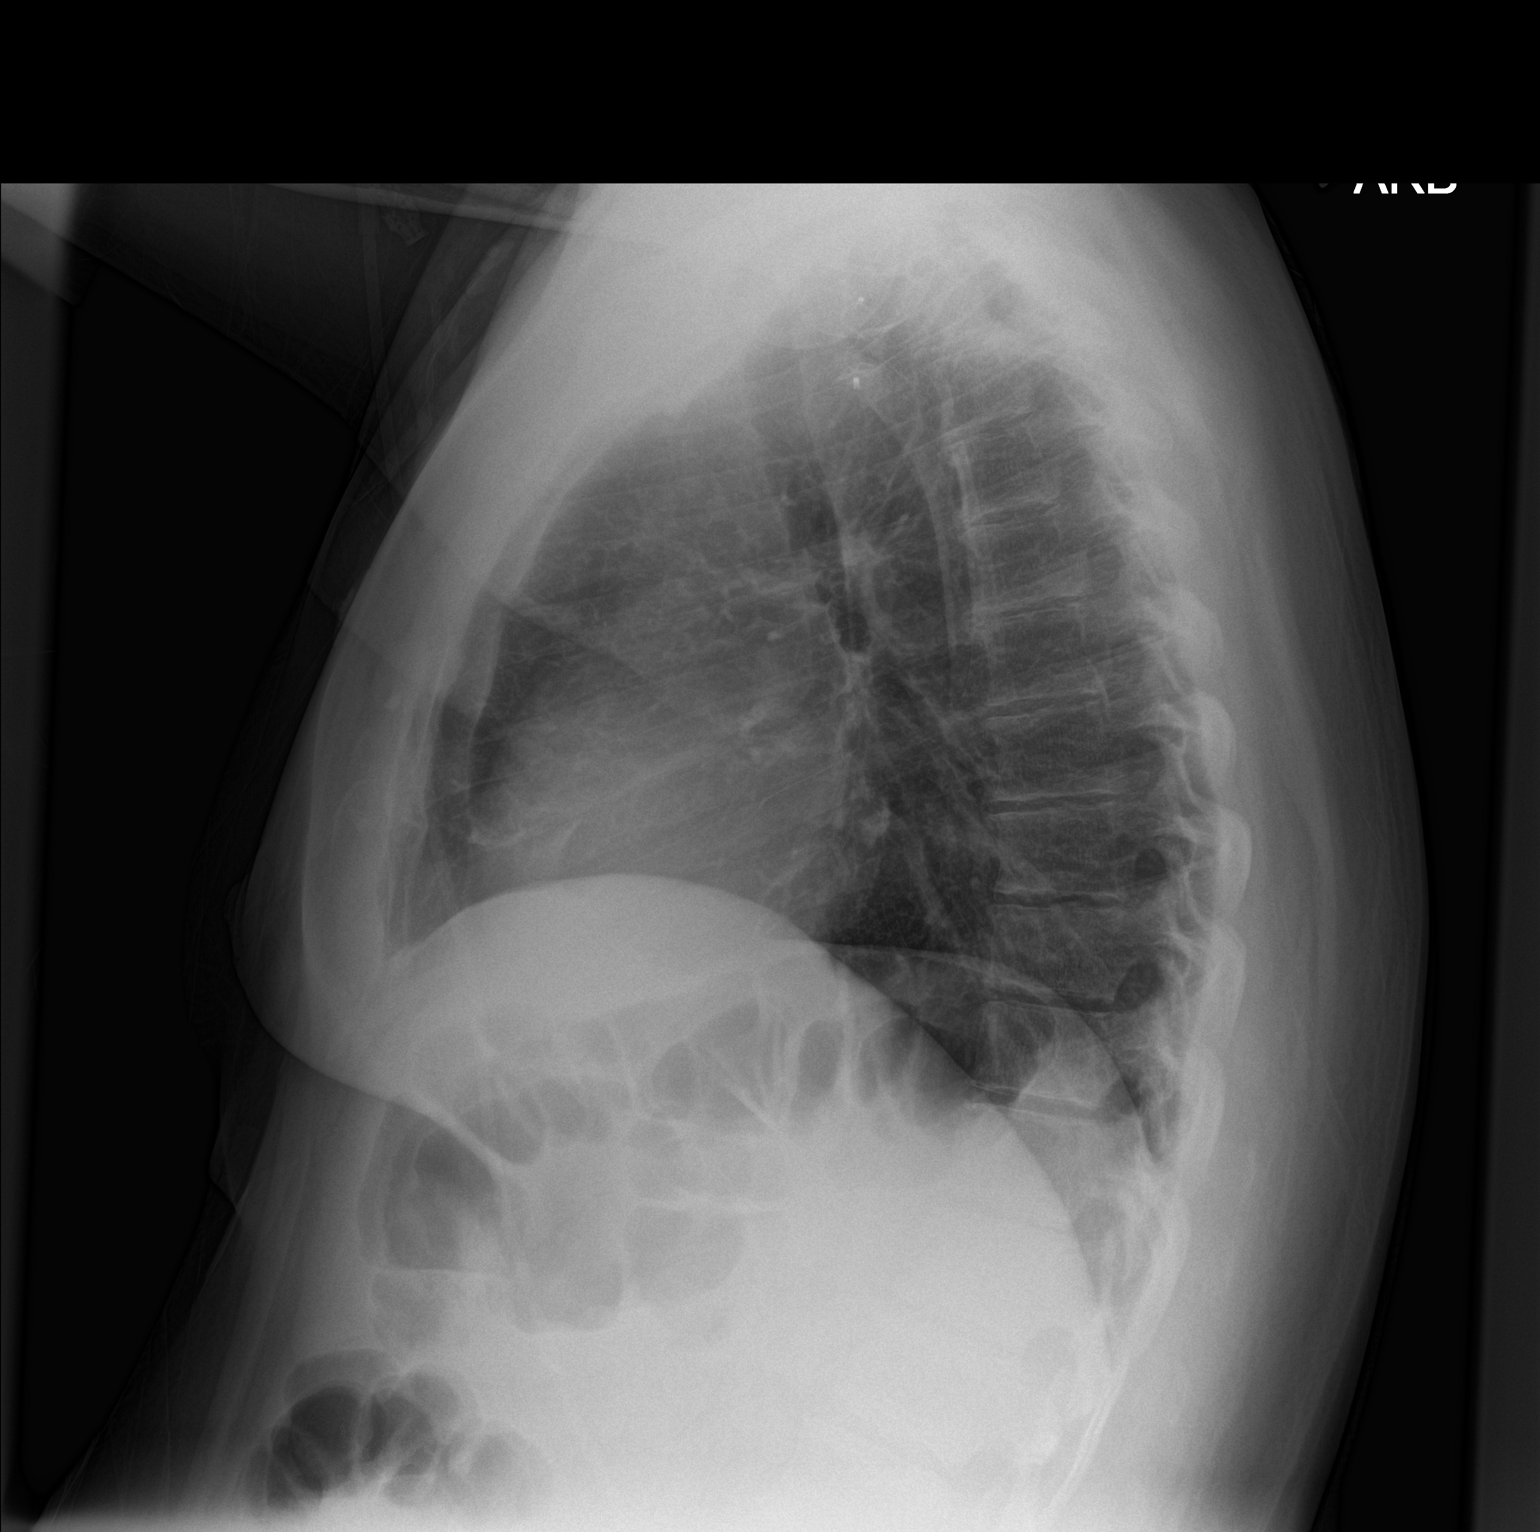

[2 of 2 positions shown; findings below may reference images not displayed]

FINDINGS: Lungs are clear.  No pleural effusion or pneumothorax.

The heart is normal in size.

Vascular clips along the left paratracheal stripe.

Visualized osseous structures are within normal limits.
IMPRESSION: Normal chest radiographs.

## 2016-05-16 IMAGING — CR DG HIP (WITH OR WITHOUT PELVIS) 2-3V*R*
1 series · 3 of 3 positions shown · non-contrast
Comparison: None.

CLINICAL DATA: Fell last night in the bathroom onto the right side.
Weakness.

EXAM:
RIGHT HIP (WITH PELVIS) 2-3 VIEWS

[Series 1: dg hip unilat with pelvis 2-3 views righ · 0.14mm/px · 3 of 3 slices shown]
[im 1/3]
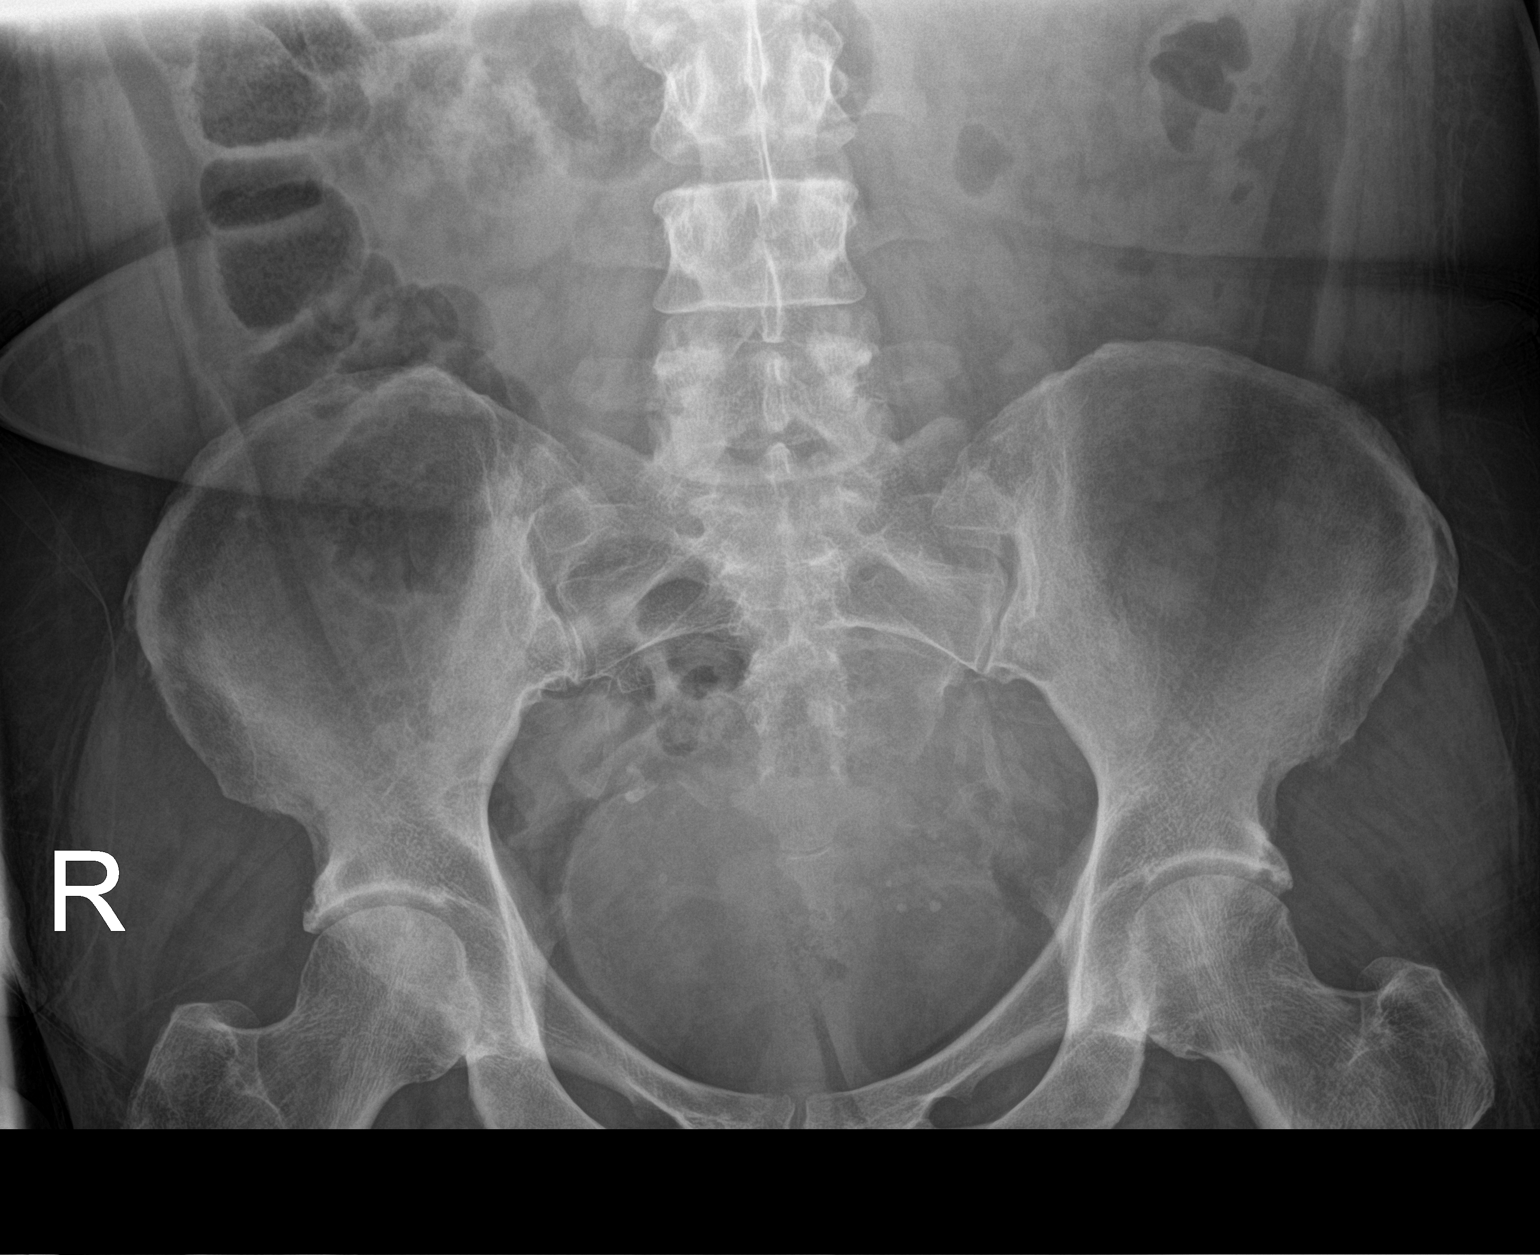
[im 2/3]
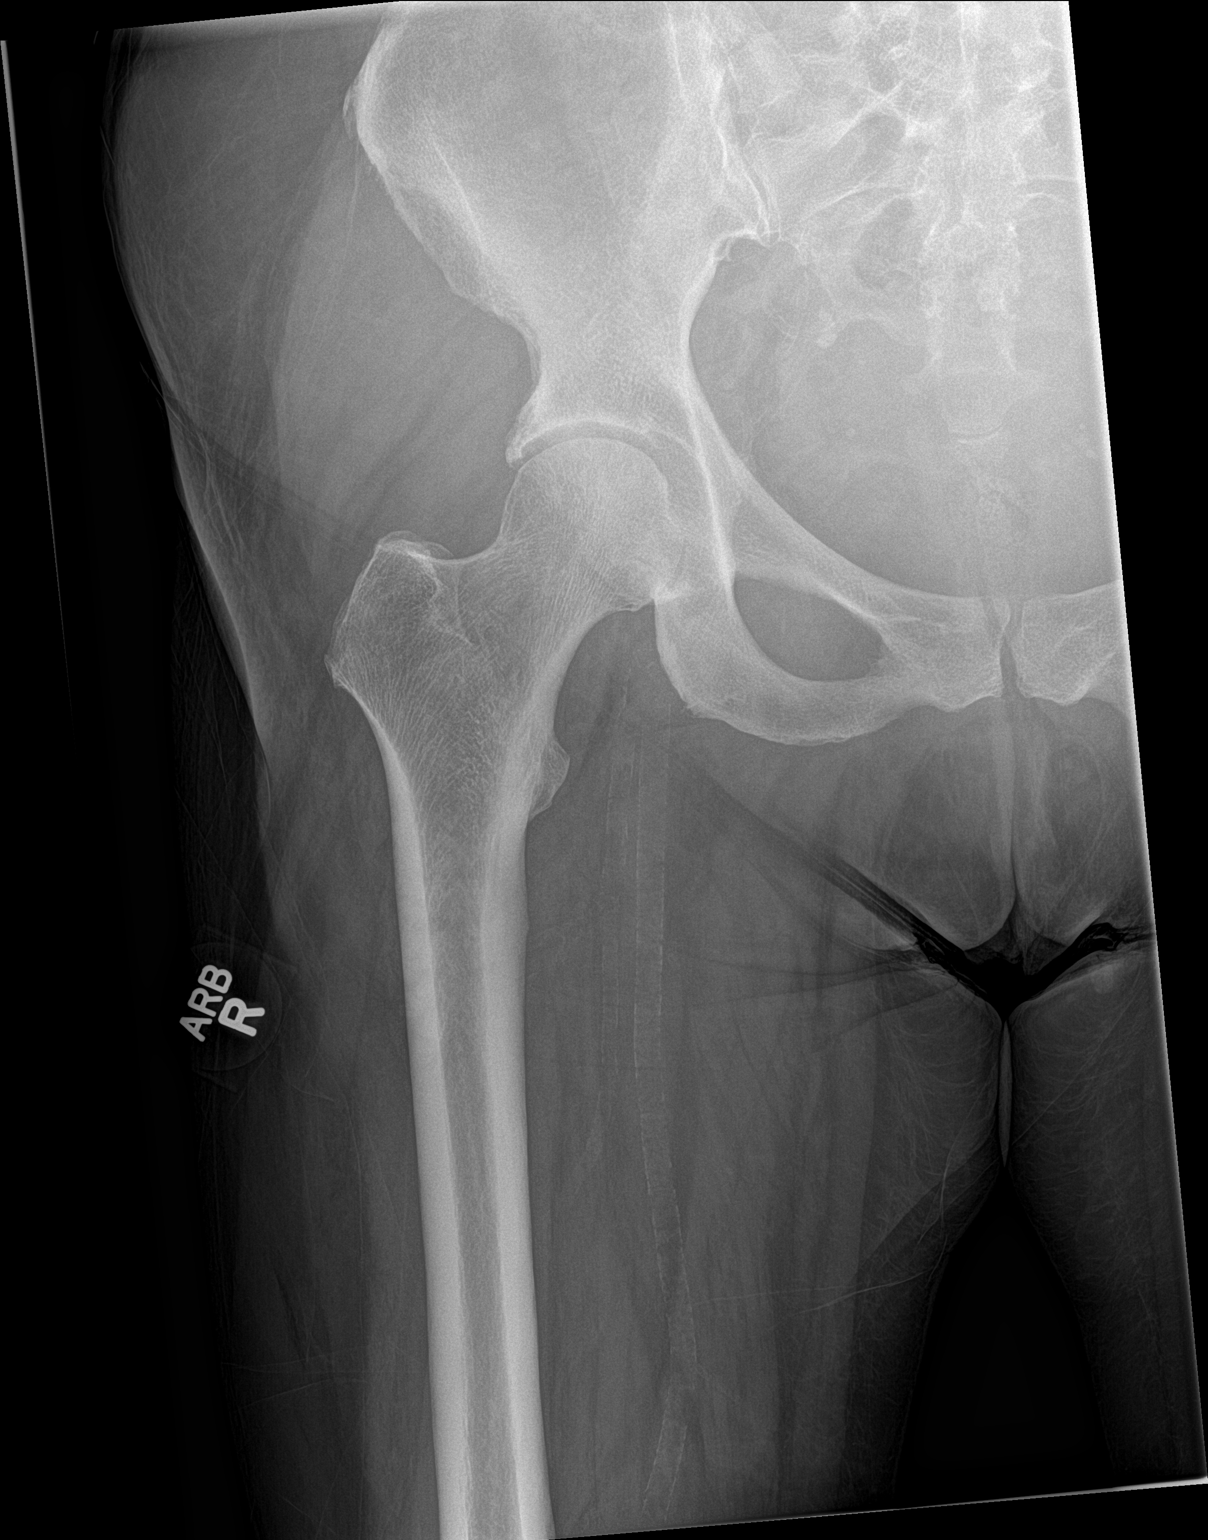
[im 3/3]
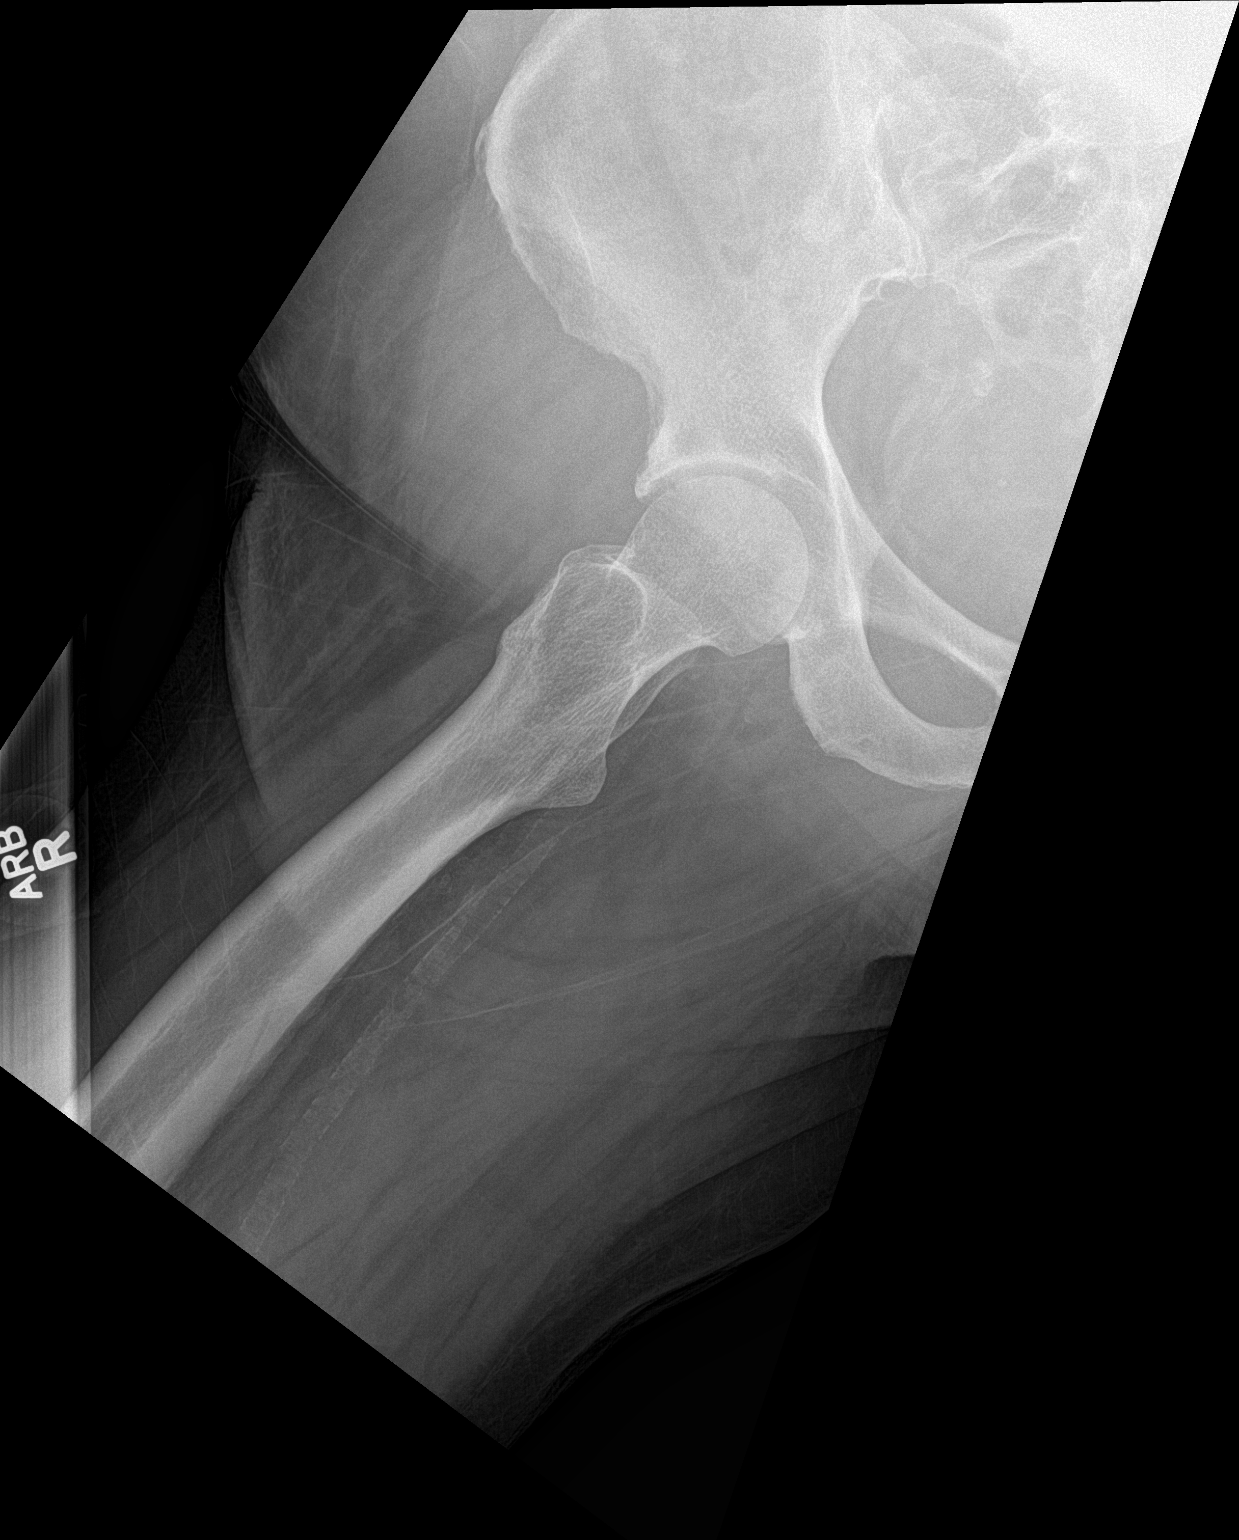

[3 of 3 positions shown; findings below may reference images not displayed]

FINDINGS: There is no evidence of hip fracture or dislocation. There is no
evidence of arthropathy or other focal bone abnormality.
IMPRESSION: Negative.

## 2016-05-16 IMAGING — CT CT HEAD W/O CM
2 series · 14 of 30 positions shown, 16 images · non-contrast
Comparison: 12/29/2013

CLINICAL DATA: Three falls over the last 2 days. Unsure if she hit
her head. Generalized weakness. History of stroke.

EXAM:
CT HEAD WITHOUT CONTRAST
TECHNIQUE: Contiguous axial images were obtained from the base of the skull
through the vertex without intravenous contrast.

[Series 2: head wo · axial · 0.39mm/px · z∈[-126,-36]mm · 6 of 26 slices shown, 8 images]
[im 4/26  brain]
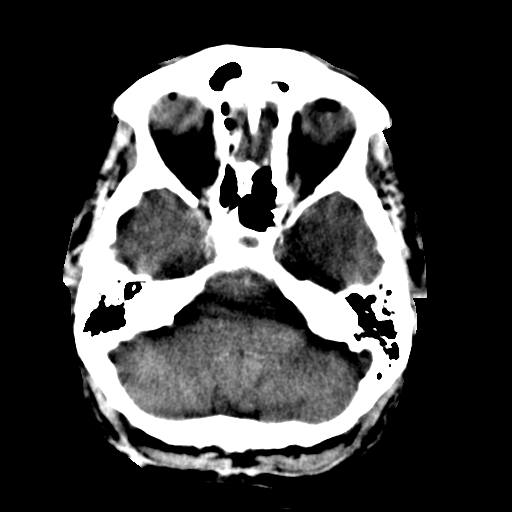
[im 4/26  bone]
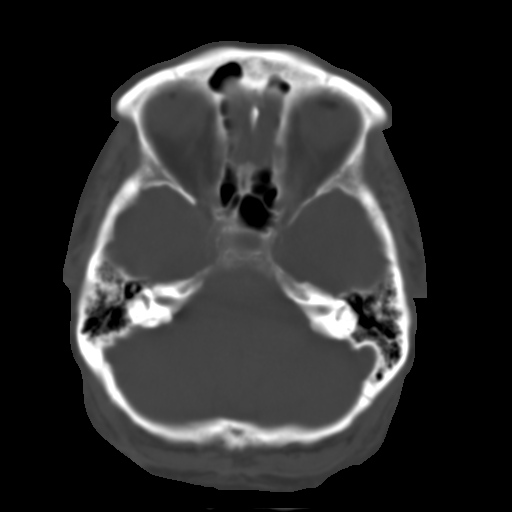
[im 8/26  brain]
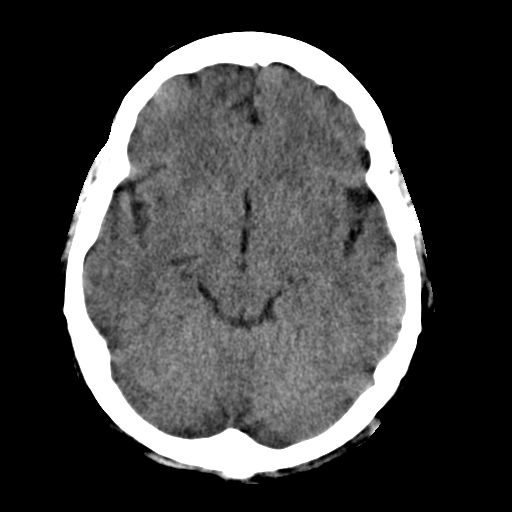
[im 11/26  brain]
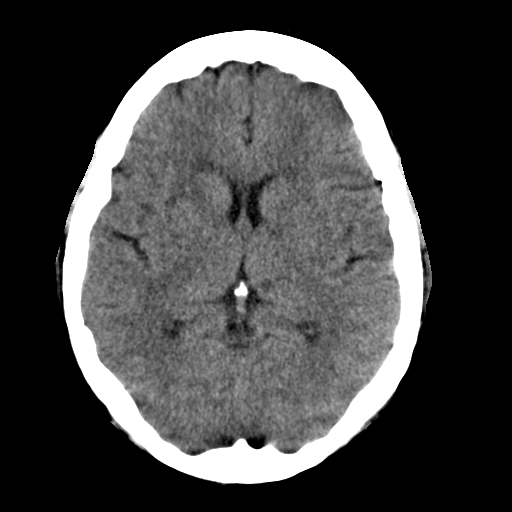
[im 15/26  brain]
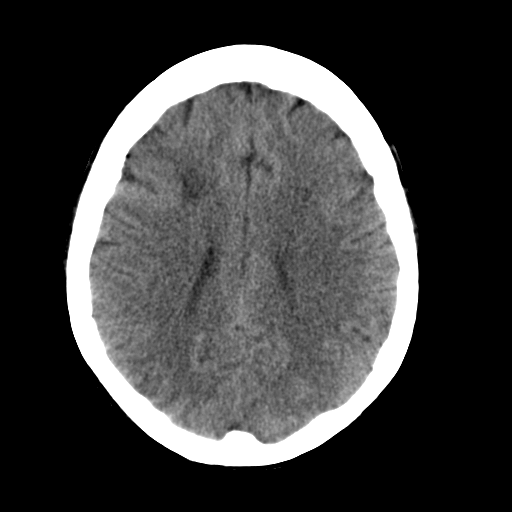
[im 18/26  brain]
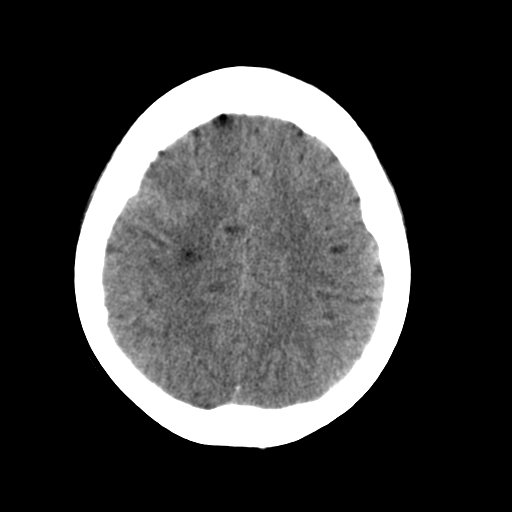
[im 18/26  bone]
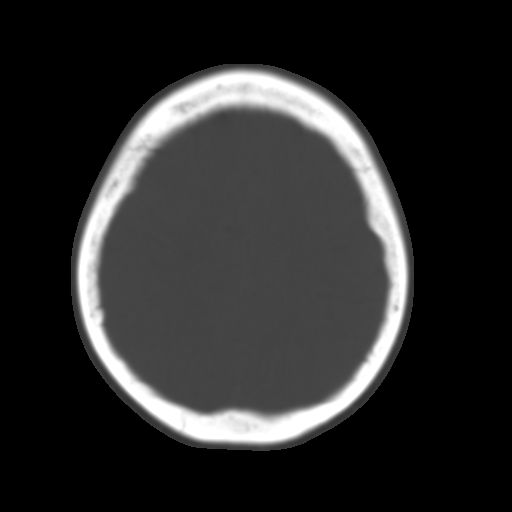
[im 22/26  brain]
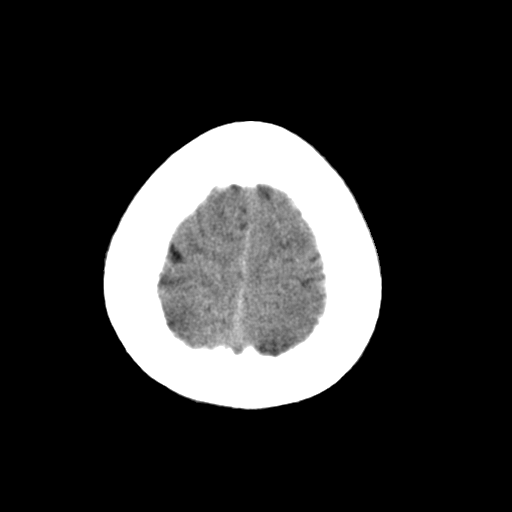

[Series 3: head bone · axial · 0.39mm/px · z∈[-137,-19]mm · 8 of 75 slices shown]
[im 8/75  bone]
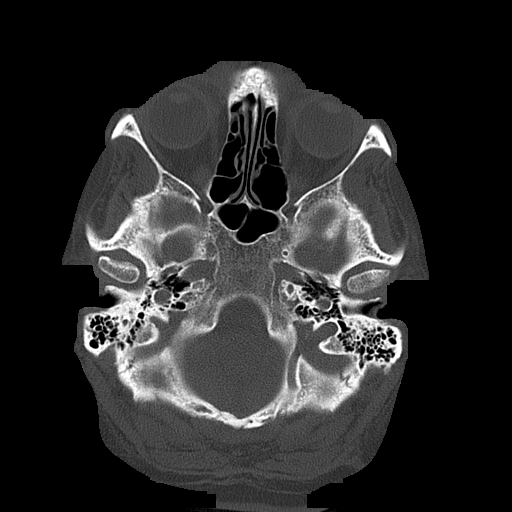
[im 15/75  bone]
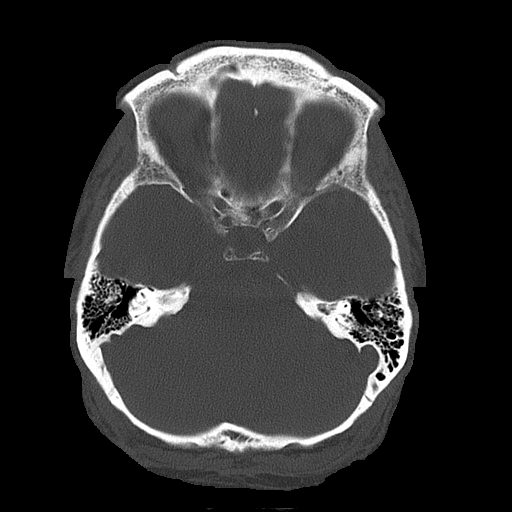
[im 25/75  bone]
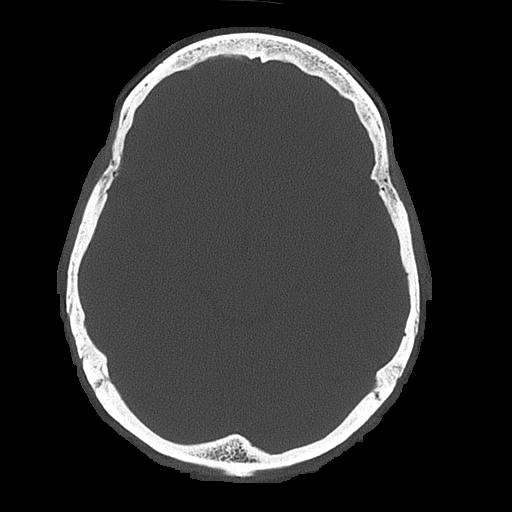
[im 32/75  bone]
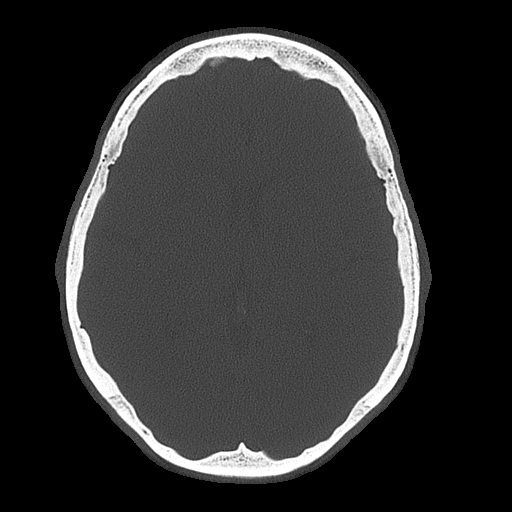
[im 43/75  bone]
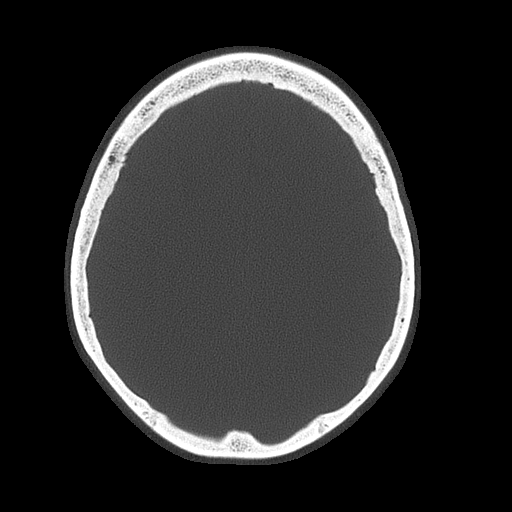
[im 50/75  bone]
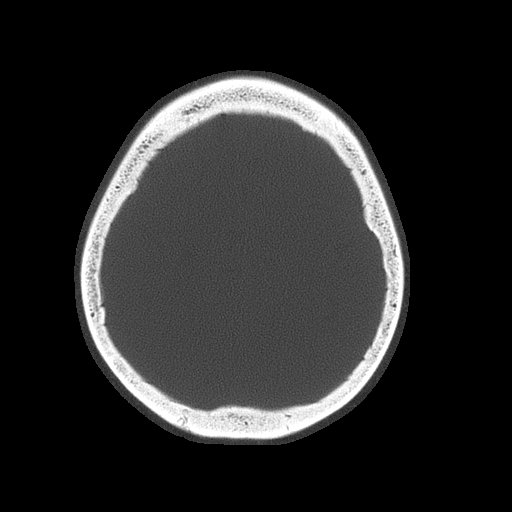
[im 60/75  bone]
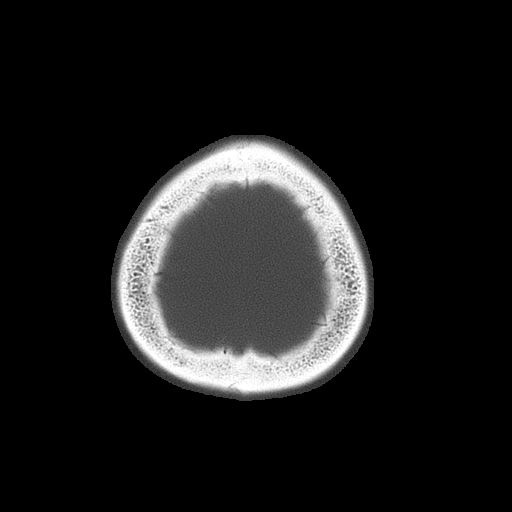
[im 67/75  bone]
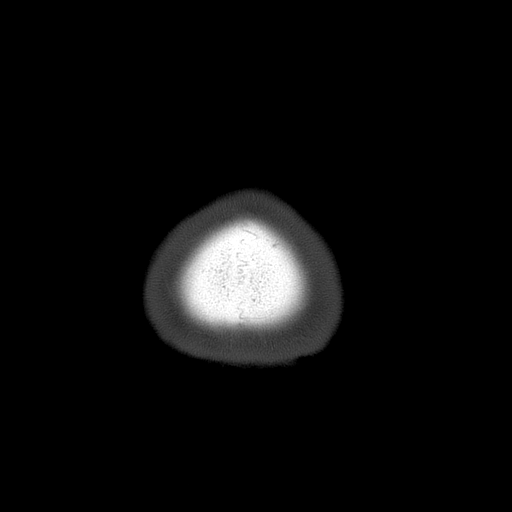

[14 of 30 positions shown; findings below may reference images not displayed]

FINDINGS: There is periventricular white matter change consistent with small
vessel disease. There is no intra or extra-axial fluid collection or
mass lesion. The basilar cisterns and ventricles have a normal
appearance. There is no CT evidence for acute infarction or
hemorrhage. No acute findings on bone windows. Note is made of
atherosclerotic calcification of the internal carotid arteries.
IMPRESSION: 1. Changes consistent with small vessel disease.
2.  No evidence for acute intracranial abnormality.

## 2016-05-17 ENCOUNTER — Other Ambulatory Visit: Payer: Self-pay | Admitting: Family Medicine

## 2016-05-18 ENCOUNTER — Ambulatory Visit (INDEPENDENT_AMBULATORY_CARE_PROVIDER_SITE_OTHER): Payer: BC Managed Care – PPO | Admitting: Family Medicine

## 2016-05-18 VITALS — BP 112/66 | HR 88 | Temp 98.7°F | Resp 16 | Wt 173.0 lb

## 2016-05-18 DIAGNOSIS — E7800 Pure hypercholesterolemia, unspecified: Secondary | ICD-10-CM

## 2016-05-18 DIAGNOSIS — E118 Type 2 diabetes mellitus with unspecified complications: Secondary | ICD-10-CM | POA: Diagnosis not present

## 2016-05-18 DIAGNOSIS — I10 Essential (primary) hypertension: Secondary | ICD-10-CM

## 2016-05-18 LAB — POCT GLYCOSYLATED HEMOGLOBIN (HGB A1C): HEMOGLOBIN A1C: 5.4

## 2016-05-18 NOTE — Patient Instructions (Signed)
Please contact local Gyn to schedule pap test.

## 2016-05-18 NOTE — Progress Notes (Signed)
Patient: [name] Female    DOB: [birth year]   46 y.o.   MRN: [id number] Visit Date: 05/18/2016  Today's Provider: [name], MD   Chief Complaint  Patient presents with  . Hyperlipidemia  . Hypertension  . Diabetes   Subjective:    Depression         This is a chronic problem.  The problem has been gradually worsening (Especially in the last two weeks.  ) since onset.  Associated symptoms include decreased concentration, fatigue, insomnia, irritable, restlessness, decreased interest, myalgias, headaches, indigestion and sad.  Associated symptoms include no helplessness, no hopelessness, no appetite change, no body aches and no suicidal ideas.  Past treatments include SSRIs - Selective serotonin reuptake inhibitors.  Compliance with treatment is good.  Previous treatment provided no relief (Pt feels like the celexa is not working as well anymore. ) relief.    Diabetes Mellitus Type II, Follow-up:   Lab Results  Component Value Date   HGBA1C 5.4 12/30/2015   HGBA1C 7.3 08/14/2015   HGBA1C 5.8 04/05/2015   Last seen for diabetes 5 months ago.  Management since then includes Decreased Metformin to 1,000mg  day from twice a day secondary to worsening kidney functions.   She reports fair compliance with treatment. Pt reports she is still taking Metformin twice a day She is not having side effects.  Current symptoms include hypoglycemia , nausea, polydipsia and vomitting and have been stable. Home blood sugar records: Low to mid 100's  Episodes of hypoglycemia? yes - Pt says she has had "5 lows in the past few months".    Current Insulin Regimen: None Most Recent Eye Exam: Over a year Weight trend: stable Current diet: in general, an "unhealthy" diet Current exercise: none  Has gone back up on the metformin to 1000mg  twice a day, and has stopped Invokana due to frequent UTIs.   ------------------------------------------------------------------------   Hypertension, follow-up:  BP Readings from Last 3 Encounters:  05/18/16 112/66  04/24/16 129/79  04/16/16 (!) 144/84    She was last seen for hypertension 5 months ago.  BP at that visit was 112/82. Management since that visit includes Advised pt to drink more water and follow up with Nephrology. She reports excellent compliance with treatment. She is not having side effects.  She is not exercising. She is adherent to low salt diet.    She is experiencing chest pain, fatigue and palpitations.  Patient denies lower extremity edema.   Cardiovascular risk factors include diabetes mellitus, dyslipidemia, hypertension and sedentary lifestyle.   ------------------------------------------------------------------------    Lipid/Cholesterol, Follow-up:   Last seen for this 5 months ago.  Management since that visit includes None.  Last Lipid Panel:    Component Value Date/Time   CHOL 164 11/09/2014 0454   CHOL 197 12/30/2013 0355   TRIG 259 (H) 11/09/2014 0454   TRIG 261 (H) 12/30/2013 0355   HDL 27 (L) 11/09/2014 0454   HDL 28 (L) 12/30/2013 0355   CHOLHDL 6.1 11/09/2014 0454   VLDL 52 (H) 11/09/2014 0454   VLDL 52 (H) 12/30/2013 0355   LDLCALC 85 11/09/2014 0454   LDLCALC 117 (H) 12/30/2013 0355   LDLDIRECT 83.3 01/26/2011 0913    She reports excellent compliance with treatment. She is not having side effects.   Wt Readings from Last 3 Encounters:  05/18/16 173 lb (78.5 kg)  04/21/16 167 lb (75.8 kg)  04/24/16 168 lb 10.4 oz (76.5 kg)    ------------------------------------------------------------------------  No Known Allergies   Current Outpatient Prescriptions:  .  Ascorbic Acid (VITAMIN C PO), Take by mouth daily., Disp: , Rfl:  .  aspirin EC 81 MG tablet, Take 1 tablet (81 mg total) by mouth daily., Disp: , Rfl:  .  Biotin w/ Vitamins C & E (HAIR SKIN & NAILS GUMMIES  PO), Take by mouth daily., Disp: , Rfl:  .  citalopram (CELEXA) 40 MG tablet, TAKE ONE TABLET BY MOUTH ONCE DAILY, Disp: 30 tablet, Rfl: 5 .  clopidogrel (PLAVIX) 75 MG tablet, Take 1 tablet (75 mg total) by mouth daily., Disp: 30 tablet, Rfl: 11 .  diltiazem (CARDIZEM CD) 120 MG 24 hr capsule, Take 1 capsule (120 mg total) by mouth daily., Disp: 90 capsule, Rfl: 3 .  eszopiclone (LUNESTA) 2 MG TABS tablet, TAKE 1 TABLET BY MOUTH AT BEDTIME AS NEEDED FOR SLEEP, Disp: 30 tablet, Rfl: 5 .  gabapentin (NEURONTIN) 300 MG capsule, Take 300 mg by mouth at bedtime. 600 mg in the morning, Disp: , Rfl:  .  isosorbide mononitrate (IMDUR) 60 MG 24 hr tablet, Take 1 tablet (60 mg total) by mouth daily., Disp: 30 tablet, Rfl: 6 .  levothyroxine (SYNTHROID, LEVOTHROID) 25 MCG tablet, Take 1 tablet (25 mcg total) by mouth daily before breakfast., Disp: 30 tablet, Rfl: 12 .  losartan (COZAAR) 50 MG tablet, Take 1 tablet (50 mg total) by mouth daily., Disp: 30 tablet, Rfl: 12 .  metFORMIN (GLUCOPHAGE) 1000 MG tablet, TAKE ONE TABLET BY MOUTH TWICE DAILY, Disp: 60 tablet, Rfl: 12 .  metoprolol succinate (TOPROL-XL) 25 MG 24 hr tablet, Take 1 tablet (25 mg total) by mouth daily. Take with or immediately following a meal., Disp: 30 tablet, Rfl: 11 .  nitroGLYCERIN (NITROSTAT) 0.4 MG SL tablet, Place 1 tablet (0.4 mg total) under the tongue every 5 (five) minutes as needed for chest pain., Disp: 25 tablet, Rfl: 6 .  OVER THE COUNTER MEDICATION, Take 2 tablets by mouth daily. B-12 gummies, Disp: , Rfl:  .  pantoprazole (PROTONIX) 40 MG tablet, Take 1 tablet (40 mg total) by mouth 2 (two) times daily., Disp: 60 tablet, Rfl: 5 .  promethazine (PHENERGAN) 25 MG tablet, Take 1 tablet (25 mg total) by mouth every 6 (six) hours as needed for nausea or vomiting. Reported on 05/31/2015, Disp: 30 tablet, Rfl: 3 .  rosuvastatin (CRESTOR) 40 MG tablet, Take 1 tablet (40 mg total) by mouth daily at 6 PM., Disp: 90 tablet, Rfl: 3 .   sucralfate (CARAFATE) 1 GM/10ML suspension, Take 10 mLs (1 g total) by mouth 4 (four) times daily., Disp: 420 mL, Rfl: 1 .  traZODone (DESYREL) 100 MG tablet, Take 2 tablets (200 mg total) by mouth at bedtime., Disp: 30 tablet, Rfl: 6  Review of Systems  Constitutional: Positive for fatigue. Negative for activity change, appetite change, chills, diaphoresis, fever and unexpected weight change.  Respiratory: Positive for shortness of breath. Negative for apnea, cough, choking, chest tightness, wheezing and stridor.   Cardiovascular: Positive for chest pain and palpitations. Negative for leg swelling.  Gastrointestinal: Positive for abdominal pain, diarrhea, nausea and vomiting. Negative for abdominal distention, anal bleeding, blood in stool, constipation and rectal pain.  Endocrine: Positive for polydipsia. Negative for cold intolerance, heat intolerance, polyphagia and polyuria.  Musculoskeletal: Positive for myalgias.  Neurological: Positive for headaches. Negative for dizziness and light-headedness.  Psychiatric/Behavioral: Positive for agitation, decreased concentration, depression, dysphoric mood and sleep disturbance. Negative for behavioral problems, confusion, hallucinations, self-injury and suicidal  ideas. The patient is nervous/anxious and has insomnia. The patient is not hyperactive.     Social History  Substance Use Topics  . Smoking status: Former Smoker    Types: Cigarettes    Quit date: 08/31/1994  . Smokeless tobacco: Never Used     Comment: quit 28 years ago  . Alcohol use No   Objective:   BP 112/66 (BP Location: Left Arm, Patient Position: Sitting, Cuff Size: Normal)   Pulse 88   Temp 98.7 F (37.1 C) (Oral)   Resp 16   Wt 173 lb (78.5 kg)   BMI 30.65 kg/m  Vitals:   05/18/16 1629  BP: 112/66  Pulse: 88  Resp: 16  Temp: 98.7 F (37.1 C)  TempSrc: Oral  Weight: 173 lb (78.5 kg)   Depression screen PHQ 2/9 05/18/2016  Decreased Interest 2  Down, Depressed,  Hopeless 2  PHQ - 2 Score 4  Altered sleeping 3  Tired, decreased energy 3  Change in appetite 3  Feeling bad or failure about yourself  1  Trouble concentrating 1  Moving slowly or fidgety/restless 1  Suicidal thoughts 0  PHQ-9 Score 16  Some recent data might be hidden    Results for orders placed or performed in visit on 05/18/16  POCT glycosylated hemoglobin (Hb A1C)  Result Value Ref Range   Hemoglobin A1C 5.4      Physical Exam  Constitutional: She is irritable.  (computer error, patient is not irritable)   General Appearance:    Alert, cooperative, no distress  Eyes:    PERRL, conjunctiva/corneas clear, EOM's intact       Lungs:     Clear to auscultation bilaterally, respirations unlabored  Heart:    Regular rate and rhythm  Neurologic:   Awake, alert, oriented x 3. No apparent focal neurological           defect.         Results for orders placed or performed in visit on 05/18/16  POCT glycosylated hemoglobin (Hb A1C)  Result Value Ref Range   Hemoglobin A1C 5.4        Assessment & Plan:     1. Pure hypercholesterolemia She is tolerating rosuvastatin well with no adverse effects.   - Lipid panel  2. Type 2 diabetes mellitus with complication, without long-term current use of insulin (HCC) Well controlled.  Continue current medications.   - POCT glycosylated hemoglobin (Hb A1C)  3. Essential hypertension Well controlled.  Continue current medications.         [name], MD  [facility]

## 2016-05-29 ENCOUNTER — Telehealth: Payer: Self-pay | Admitting: *Deleted

## 2016-05-29 ENCOUNTER — Encounter: Payer: Self-pay | Admitting: Family Medicine

## 2016-05-29 DIAGNOSIS — E7800 Pure hypercholesterolemia, unspecified: Secondary | ICD-10-CM

## 2016-05-29 LAB — LIPID PANEL
CHOL/HDL RATIO: 4.7 ratio — AB (ref 0.0–4.4)
CHOLESTEROL TOTAL: 159 mg/dL (ref 100–199)
HDL: 34 mg/dL — AB (ref >39)
LDL CALC: 95 mg/dL (ref 0–99)
TRIGLYCERIDES: 148 mg/dL (ref 0–149)
VLDL Cholesterol Cal: 30 mg/dL (ref 5–40)

## 2016-05-29 MED ORDER — EZETIMIBE 10 MG PO TABS: 10.0000 mg | ORAL_TABLET | Freq: Every day | ORAL | 5 refills | Status: DC

## 2016-05-29 NOTE — Telephone Encounter (Signed)
Patient was notified of results. Expressed understanding. Rx sent to pharmacy. 

## 2016-05-29 NOTE — Telephone Encounter (Signed)
-----   Message from [name], MD sent at 05/29/2016  7:54 AM EDT ----- LDL cholesterol is 95, but due to her underlying heart disease and history of stroke it needs to be under 70. Make sure she is still taking 40mg  Crestor every day. If so need to Add Zetia 10mg  once a day, #30, rf x 5. Will recheck cholesterol at next follow up appointment.

## 2016-06-01 IMAGING — MR MR HEAD WO/W CM
13 of 14 series · 41 of 48 positions shown · IV contrast (multihance)
Comparison: CT head 06/03/2014.  MR head 05/19/2013.

CLINICAL DATA: Fell 3 weeks ago, headache, nausea, and blurred
vision. Imbalance and confusion. History of hypertension. History of
diabetes.

EXAM:
MRI HEAD WITHOUT AND WITH CONTRAST
TECHNIQUE: Multiplanar, multiecho pulse sequences of the brain and surrounding
structures were obtained without and with intravenous contrast.
CONTRAST:  8 mL MultiHance.

[Series 2: T1 · sagittal · 5.0mm · 0.45mm/px · 1 of 28 slices shown]
[im 1/28]
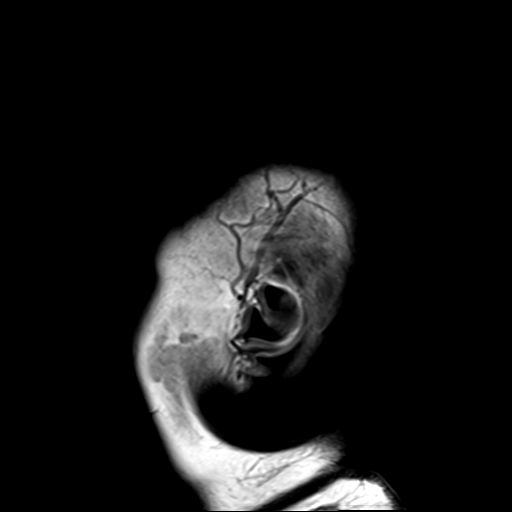

[Series 4: DWI · axial · 3.0mm · 1.80mm/px · z∈[-52,+102]mm · 5 of 55 slices shown (1 of 4)]
[im 1/55]
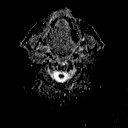
[im 14/55]
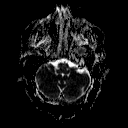
[im 28/55]
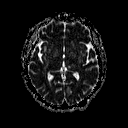
[im 41/55]
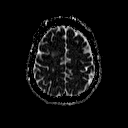
[im 55/55]
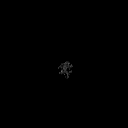

[Series 6: DWI · coronal · 3.0mm · 1.80mm/px · 4 of 46 slices shown (2 of 4)]
[im 1/46]
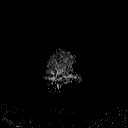
[im 16/46]
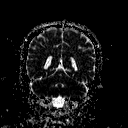
[im 31/46]
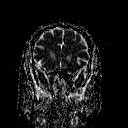
[im 46/46]
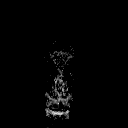

[Series 7: T2 · axial · 5.0mm · 0.60mm/px · z∈[-49,+99]mm · 2 of 25 slices shown (1 of 2)]
[im 1/25]
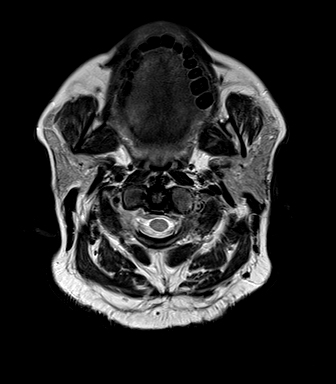
[im 25/25]
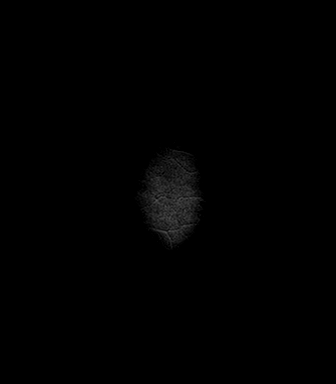

[Series 8: FLAIR · axial · 5.0mm · 0.45mm/px · z∈[-49,+99]mm · 2 of 25 slices shown (1 of 2)]
[im 1/25]
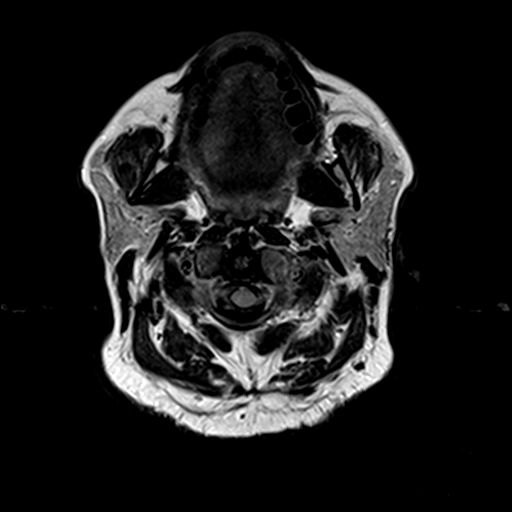
[im 25/25]
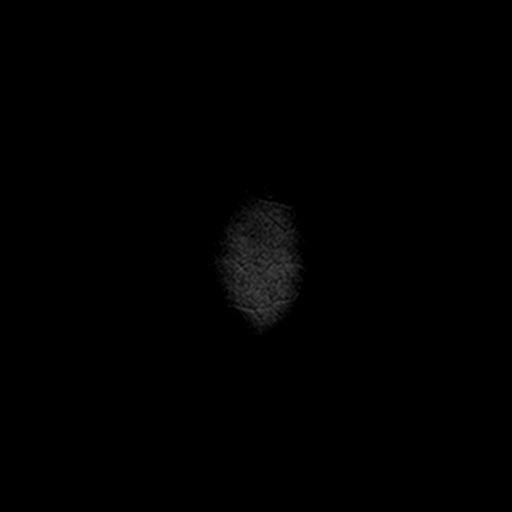

[Series 9: T2 · axial · 5.0mm · 0.45mm/px · z∈[-49,+99]mm · 2 of 25 slices shown (2 of 2)]
[im 1/25]
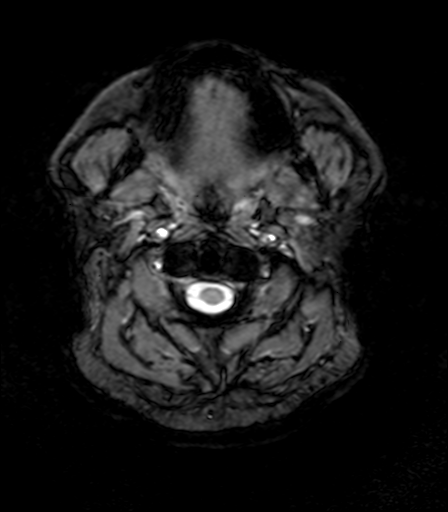
[im 25/25]
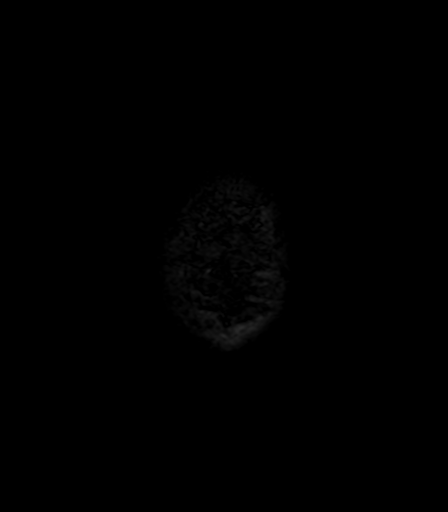

[Series 11: FLAIR · sagittal · 5.0mm · 0.43mm/px · 2 of 25 slices shown (2 of 2)]
[im 1/25]
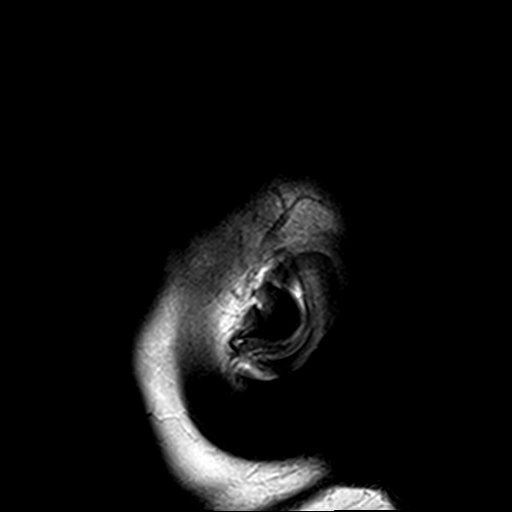
[im 25/25]
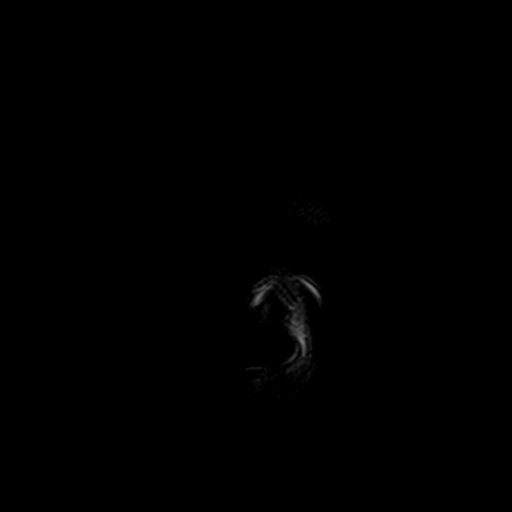

[Series 12: T2 post-contrast · coronal · 5.0mm · 0.49mm/px · 3 of 29 slices shown]
[im 1/29]
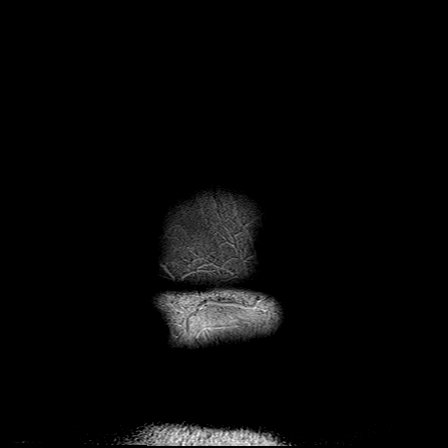
[im 15/29]
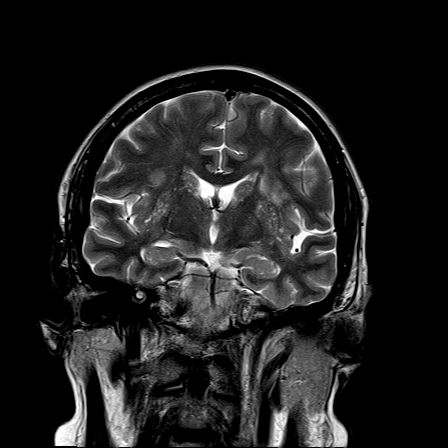
[im 29/29]
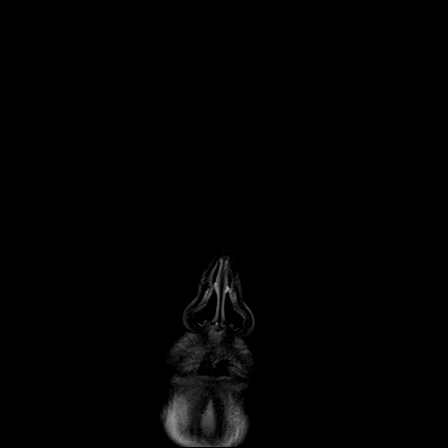

[Series 13: T1 post-contrast · axial · 3.0mm · 1.00mm/px · z∈[-55,+113]mm · 5 of 60 slices shown (1 of 3)]
[im 1/60]
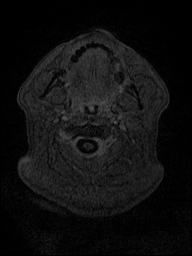
[im 15/60]
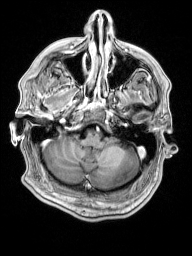
[im 30/60]
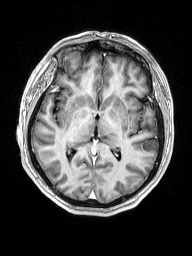
[im 45/60]
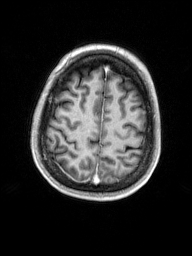
[im 60/60]
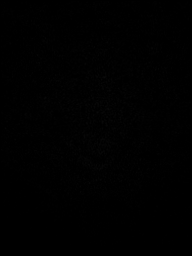

[Series 14: T1 post-contrast · coronal · 5.0mm · 0.43mm/px · 3 of 29 slices shown (2 of 3)]
[im 1/29]
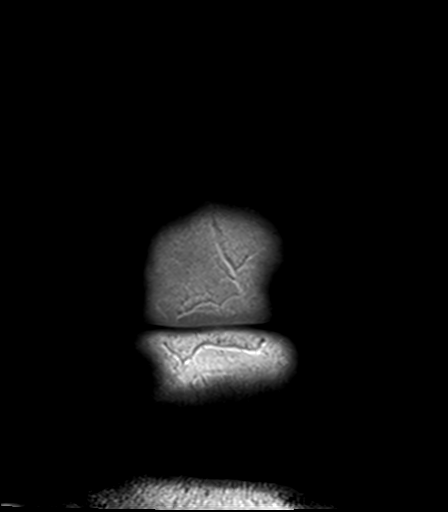
[im 15/29]
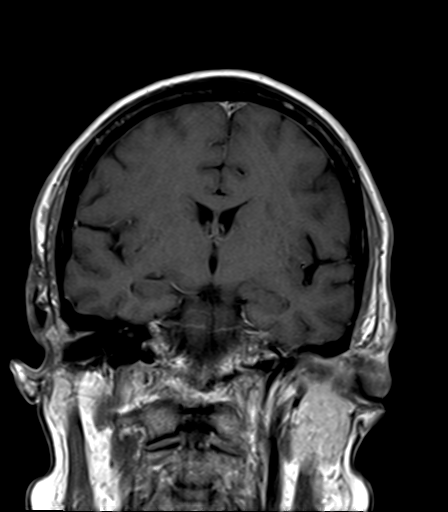
[im 29/29]
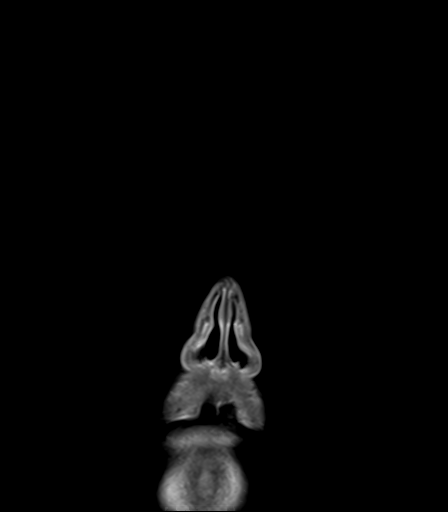

[Series 15: T1 post-contrast · sagittal · 5.0mm · 0.45mm/px · 3 of 28 slices shown (3 of 3)]
[im 1/28]
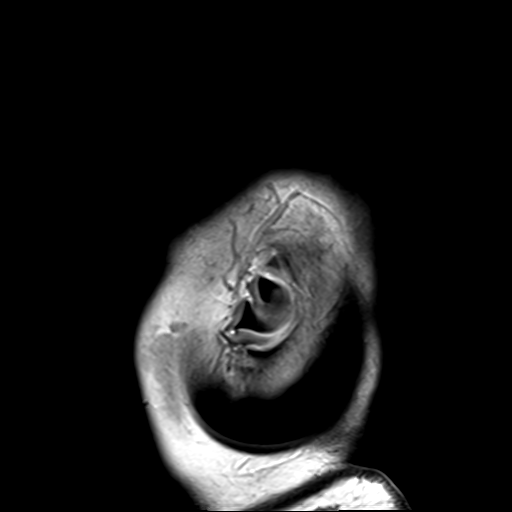
[im 14/28]
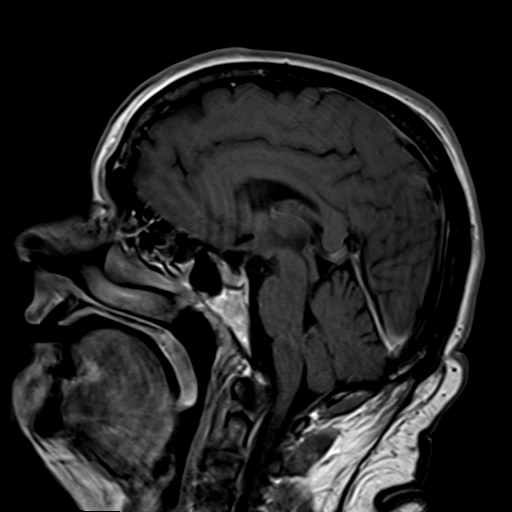
[im 28/28]
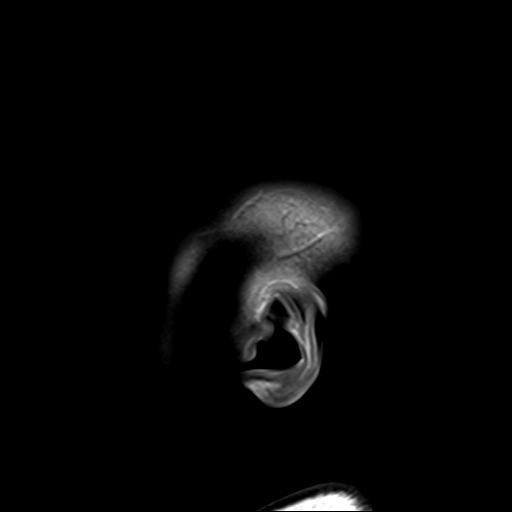

[Series 100: DWI · axial · 3.0mm · 1.80mm/px · z∈[-52,+102]mm · 5 of 55 slices shown (3 of 4)]
[im 1/55]
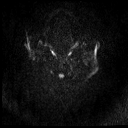
[im 14/55]
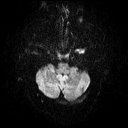
[im 28/55]
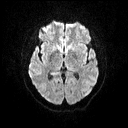
[im 41/55]
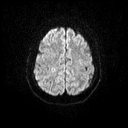
[im 55/55]
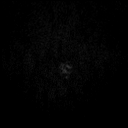

[Series 101: DWI · coronal · 3.0mm · 1.80mm/px · 4 of 47 slices shown (4 of 4)]
[im 1/47]
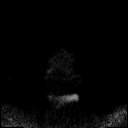
[im 16/47]
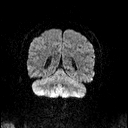
[im 31/47]
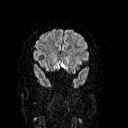
[im 47/47]
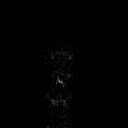

[41 of 48 positions shown; findings below may reference images not displayed]

FINDINGS: No restricted diffusion, acute hemorrhage, mass lesion,
hydrocephalus, or extra-axial fluid. Premature for age cerebral and
cerebellar atrophy.

Extensive T2 and FLAIR hyperintensities throughout the
periventricular greater than subcortical white matter, greatest in
the RIGHT frontal region, favored to represent demyelinating
disease. The ovoid morphology perpendicular to, and involvement of,
the corpus callosum is more typical of demyelinating disease.
Chronic microvascular ischemic change related to hypertension and
hyperlipidemia, vasculitis, chronic infection, or chronic migraine
less favored. Prominent perivascular spaces. Flow voids are
maintained throughout the carotid, basilar, and vertebral arteries.
There are no areas of chronic hemorrhage. Pituitary, pineal, and
cerebellar tonsils unremarkable. No upper cervical lesions.

Post infusion, no abnormal enhancement of the brain or meninges.
Extracranial soft tissues grossly unremarkable.

Compared with 05/09/2013, there is additional chronic demyelination
in the RIGHT frontal lobe, LEFT thalamus, LEFT lentiform nucleus,
and LEFT pons. None of these areas display diffusion restriction on
today's exam.
IMPRESSION: Progression of white matter disease since May 2013 favored to
represent progressive demyelinating disease. Chronic microvascular
ischemic change is certainly possible, but less favored.

No posttraumatic sequelae are evident despite the recent fall 3
weeks ago.

## 2016-06-05 ENCOUNTER — Ambulatory Visit (INDEPENDENT_AMBULATORY_CARE_PROVIDER_SITE_OTHER): Payer: BC Managed Care – PPO | Admitting: Family Medicine

## 2016-06-05 ENCOUNTER — Encounter: Payer: Self-pay | Admitting: Family Medicine

## 2016-06-05 VITALS — BP 144/90 | HR 68 | Temp 98.5°F | Resp 16 | Wt 169.0 lb

## 2016-06-05 DIAGNOSIS — F5102 Adjustment insomnia: Secondary | ICD-10-CM

## 2016-06-05 DIAGNOSIS — F439 Reaction to severe stress, unspecified: Secondary | ICD-10-CM

## 2016-06-05 MED ORDER — ALPRAZOLAM 0.5 MG PO TABS
0.2500 mg | ORAL_TABLET | Freq: Four times a day (QID) | ORAL | 1 refills | Status: DC | PRN
Start: 2016-06-05 — End: 2016-07-24

## 2016-06-05 NOTE — Progress Notes (Signed)
Patient: [name] Female    DOB: 1970/04/09   46 y.o.   MRN: [id number] Visit Date: 06/05/2016  Today's Provider: [name], MD   Chief Complaint  Patient presents with  . Stress   Subjective:    HPI Stress: Patient comes in today reporting that she has been under more stress lately. She recently separated from her husband, and her dad has been sick. Patient states she has not been able to eat or sleep. Patient reports that she has had some panic attacks and frequent crying spells. Patient is currently taking Celexa which she has been on for many years for depression. She has taken alprazolam in the past which she found to work well.     No Known Allergies   Current Outpatient Prescriptions:  .  Ascorbic Acid (VITAMIN C PO), Take by mouth daily., Disp: , Rfl:  .  aspirin EC 81 MG tablet, Take 1 tablet (81 mg total) by mouth daily., Disp: , Rfl:  .  Biotin w/ Vitamins C & E (HAIR SKIN & NAILS GUMMIES PO), Take by mouth daily., Disp: , Rfl:  .  citalopram (CELEXA) 40 MG tablet, TAKE ONE TABLET BY MOUTH ONCE DAILY, Disp: 30 tablet, Rfl: 5 .  clopidogrel (PLAVIX) 75 MG tablet, Take 1 tablet (75 mg total) by mouth daily., Disp: 30 tablet, Rfl: 11 .  diltiazem (CARDIZEM CD) 120 MG 24 hr capsule, Take 1 capsule (120 mg total) by mouth daily., Disp: 90 capsule, Rfl: 3 .  eszopiclone (LUNESTA) 2 MG TABS tablet, TAKE 1 TABLET BY MOUTH AT BEDTIME AS NEEDED FOR SLEEP, Disp: 30 tablet, Rfl: 5 .  ezetimibe (ZETIA) 10 MG tablet, Take 1 tablet (10 mg total) by mouth daily., Disp: 30 tablet, Rfl: 5 .  gabapentin (NEURONTIN) 300 MG capsule, Take 300 mg by mouth at bedtime. 600 mg in the morning, Disp: , Rfl:  .  isosorbide mononitrate (IMDUR) 60 MG 24 hr tablet, Take 1 tablet (60 mg total) by mouth daily., Disp: 30 tablet, Rfl: 6 .  levothyroxine (SYNTHROID, LEVOTHROID) 25 MCG tablet, Take 1 tablet (25 mcg total) by mouth daily before breakfast., Disp: 30 tablet, Rfl: 12 .  losartan  (COZAAR) 50 MG tablet, Take 1 tablet (50 mg total) by mouth daily., Disp: 30 tablet, Rfl: 12 .  metFORMIN (GLUCOPHAGE) 1000 MG tablet, TAKE ONE TABLET BY MOUTH TWICE DAILY, Disp: 60 tablet, Rfl: 12 .  metoprolol succinate (TOPROL-XL) 25 MG 24 hr tablet, Take 1 tablet (25 mg total) by mouth daily. Take with or immediately following a meal., Disp: 30 tablet, Rfl: 11 .  nitroGLYCERIN (NITROSTAT) 0.4 MG SL tablet, Place 1 tablet (0.4 mg total) under the tongue every 5 (five) minutes as needed for chest pain., Disp: 25 tablet, Rfl: 6 .  OVER THE COUNTER MEDICATION, Take 2 tablets by mouth daily. B-12 gummies, Disp: , Rfl:  .  pantoprazole (PROTONIX) 40 MG tablet, Take 1 tablet (40 mg total) by mouth 2 (two) times daily., Disp: 60 tablet, Rfl: 5 .  promethazine (PHENERGAN) 25 MG tablet, Take 1 tablet (25 mg total) by mouth every 6 (six) hours as needed for nausea or vomiting. Reported on 05/31/2015, Disp: 30 tablet, Rfl: 3 .  rosuvastatin (CRESTOR) 40 MG tablet, Take 1 tablet (40 mg total) by mouth daily at 6 PM., Disp: 90 tablet, Rfl: 3 .  sucralfate (CARAFATE) 1 GM/10ML suspension, Take 10 mLs (1 g total) by mouth 4 (four) times daily.,  Disp: 420 mL, Rfl: 1 .  traZODone (DESYREL) 100 MG tablet, Take 2 tablets (200 mg total) by mouth at bedtime., Disp: 30 tablet, Rfl: 6  Review of Systems  Constitutional: Negative for appetite change, chills, fatigue and fever.  Respiratory: Negative for chest tightness and shortness of breath.   Cardiovascular: Negative for chest pain and palpitations.  Gastrointestinal: Negative for abdominal pain, nausea and vomiting.  Neurological: Negative for dizziness and weakness.  Psychiatric/Behavioral: Positive for agitation, confusion, decreased concentration, dysphoric mood and sleep disturbance. Negative for hallucinations, self-injury and suicidal ideas. The patient is nervous/anxious.     Social History  Substance Use Topics  . Smoking status: Former Smoker    Types:  Cigarettes    Quit date: 08/31/1994  . Smokeless tobacco: Never Used     Comment: quit 28 years ago  . Alcohol use No   Objective:   BP (!) 144/90 (BP Location: Left Arm, Patient Position: Sitting, Cuff Size: Normal)   Pulse 68   Temp 98.5 F (36.9 C) (Oral)   Resp 16   Wt 169 lb (76.7 kg)   SpO2 98% Comment: room air  BMI 29.94 kg/m  There were no vitals filed for this visit.   Physical Exam  General appearance: alert, well developed, well nourished, cooperative and in no distress Head: Normocephalic, without obvious abnormality, atraumatic Respiratory: Respirations even and unlabored, normal respiratory rate Extremities: No gross deformities Skin: Skin color, texture, turgor normal. No rashes seen  Psych: Appropriate mood and affect. Neurologic: Mental status: Alert, oriented to person, place, and time, thought content appropriate.     Assessment & Plan:     1. Situational stress Discussed prn benzodiazepine versus changing SSRI. She feels the causes of her stress or transient and would prefer to continue citalopram for now.  - ALPRAZolam (XANAX) 0.5 MG tablet; Take 0.5-1 tablets (0.25-0.5 mg total) by mouth every 6 (six) hours as needed for anxiety or sleep.  Dispense: 60 tablet; Refill: 1  2. Adjustment insomnia  - ALPRAZolam (XANAX) 0.5 MG tablet; Take 0.5-1 tablets (0.25-0.5 mg total) by mouth every 6 (six) hours as needed for anxiety or sleep.  Dispense: 60 tablet; Refill: 1  Call if symptoms change or if not rapidly improving.          [name], MD  [facility]

## 2016-06-15 NOTE — Addendum Note (Signed)
Addendum  created 06/15/16 0930 by Doreen Salvage, CRNA   Charge Capture section accepted

## 2016-06-19 ENCOUNTER — Ambulatory Visit: Payer: BC Managed Care – PPO | Admitting: Advanced Practice Midwife

## 2016-07-10 ENCOUNTER — Ambulatory Visit (INDEPENDENT_AMBULATORY_CARE_PROVIDER_SITE_OTHER): Payer: BC Managed Care – PPO | Admitting: Advanced Practice Midwife

## 2016-07-10 ENCOUNTER — Other Ambulatory Visit: Payer: Self-pay

## 2016-07-10 ENCOUNTER — Encounter: Payer: Self-pay | Admitting: Advanced Practice Midwife

## 2016-07-10 VITALS — BP 138/82 | HR 66 | Ht 63.0 in | Wt 161.0 lb

## 2016-07-10 DIAGNOSIS — Z01419 Encounter for gynecological examination (general) (routine) without abnormal findings: Secondary | ICD-10-CM

## 2016-07-10 DIAGNOSIS — Z124 Encounter for screening for malignant neoplasm of cervix: Secondary | ICD-10-CM

## 2016-07-10 DIAGNOSIS — D508 Other iron deficiency anemias: Secondary | ICD-10-CM

## 2016-07-10 DIAGNOSIS — Z113 Encounter for screening for infections with a predominantly sexual mode of transmission: Secondary | ICD-10-CM

## 2016-07-10 NOTE — Progress Notes (Signed)
Patient ID: [name], female   DOB: Feb 29, 1970, 46 y.o.   MRN: [id number]     Gynecology Annual Exam  PCP: [name], MD  Chief Complaint:  Chief Complaint  Patient presents with  . Gynecologic Exam    History of Present Illness: Patient is a 46 y.o. G7P1 presents for annual exam. The patient has complaints today of occasional hot flashes.   LMP: No LMP recorded. Patient has had a uterine ablation.   The patient is sexually active. She denies dyspareunia.  The patient does occasionally perform self breast exams.  There is notable family history of breast or ovarian cancer in her family. The patient is interested in genetic screening but has Medicaid which will not cover the screening.   The patient wears seatbelts: yes.   The patient has regular exercise: she walks 3 days per week.    The patient admits some anxiety and current symptoms of depression. She is followed by Dr Caryn Section.    Review of Systems: Review of Systems  Constitutional: Negative.   HENT: Negative.   Eyes: Negative.   Respiratory: Negative.   Cardiovascular: Negative.   Gastrointestinal: Negative.   Genitourinary: Negative.   Musculoskeletal: Negative.   Skin: Negative.   Neurological: Negative.   Endo/Heme/Allergies: Negative.   Psychiatric/Behavioral: Negative.     Past Medical History:  Past Medical History:  Diagnosis Date  . Anemia    iron deficiency anemia  . Anxiety   . Aortic arch aneurysm ([place])   . Arthritis   . Bipolar disorder ([place])   . CAD (coronary artery disease)    a. cath 08/2003 nonobs dzs;  b. 11/2014: mLAD 85-90% s/p PCI/DES (Xience 3.25 x 23); c. cath 04/2015: 70% mD1 @ bifurcation. Patent LAD stent - > Med Rx.; d. Cath-PCI 5/17: Patent LAD stent, D1 now ~80% - PCI Resolute DES 2.25 x 12.    . Chronic systolic CHF (congestive heart failure) ([place])    a. echo 01/29/2013 EF 40-45%, basal anteroseptal & mid anteroseptal segments abnl; b echo 05/2012 EF 50-55%, mildly  dilated LA, nl RVSP; c. 10/2015 Echo: EF 55-60%, mild conc LVH, no rwma.  . CKD (chronic kidney disease), stage IV ([place])   . Colon polyp   . CVA (cerebral vascular accident) ([place])    a. 06/2008-TEE nl LV fxn; b. 01/2013 - notes indicate short run of SVT at that time  . Depression   . Diabetes mellitus without complication ([place])   . Gastric ulcer 04/27/2011  . GERD (gastroesophageal reflux disease)   . Headache    Migraines  . Hyperlipidemia   . Hypertension   . Hypertensive heart disease   . Hypothyroidism   . Ischemic cardiomyopathy    a. EF prev 40-45% in 2014-->Improved to 55-60% (10/2015).  . Malignant melanoma of skin of scalp ([place])   . MI, acute, non ST segment elevation ([place])   . Morbid obesity ([place])    a. s/p gastric bypass.  . Multiple sclerosis ([place]) 2015  . Myocardial infarction ([place]) 06/03/2015  . Neuromuscular disorder ([place])   . Neuropathy   . Orthostatic hypotension   . S/P drug eluting coronary stent placement 06/04/2015  . Syncope     Past Surgical History:  Past Surgical History:  Procedure Laterality Date  . APPENDECTOMY    . CARDIAC CATHETERIZATION N/A 11/09/2014   Procedure: Coronary Angiography;  Surgeon: Minna Merritts, MD;  Location: [facility];  Service: Cardiovascular;  Laterality: N/A;  .  CARDIAC CATHETERIZATION N/A 11/12/2014   Procedure: Coronary Stent Intervention;  Surgeon: Isaias Cowman, MD;  Location: [facility];  Service: Cardiovascular;  Laterality: N/A;  . CARDIAC CATHETERIZATION N/A 04/18/2015   Procedure: Left Heart Cath and Coronary Angiography;  Surgeon: Minna Merritts, MD;  Location: [facility];  Service: Cardiovascular;  Laterality: N/A;  . CARDIAC CATHETERIZATION Left 06/04/2015   Procedure: Left Heart Cath and Coronary Angiography;  Surgeon: Wellington Hampshire, MD;  Location: [facility];  Service: Cardiovascular;  Laterality: Left;  . CARDIAC CATHETERIZATION N/A 06/04/2015   Procedure: Coronary  Stent Intervention;  Surgeon: Wellington Hampshire, MD;  Location: [facility];  Service: Cardiovascular;  Laterality: N/A;  . CESAREAN SECTION  2001  . COLONOSCOPY WITH PROPOFOL N/A 04/27/2016   Procedure: COLONOSCOPY WITH PROPOFOL;  Surgeon: [name], MD;  Location: [facility];  Service: Endoscopy;  Laterality: N/A;  . CORONARY ANGIOPLASTY    . DILATION AND CURETTAGE OF UTERUS    . ESOPHAGOGASTRODUODENOSCOPY (EGD) WITH PROPOFOL N/A 09/14/2014   Procedure: ESOPHAGOGASTRODUODENOSCOPY (EGD) WITH PROPOFOL;  Surgeon: Josefine Class, MD;  Location: [facility];  Service: Endoscopy;  Laterality: N/A;  . ESOPHAGOGASTRODUODENOSCOPY (EGD) WITH PROPOFOL N/A 04/27/2016   Procedure: ESOPHAGOGASTRODUODENOSCOPY (EGD) WITH PROPOFOL;  Surgeon: [name], MD;  Location: [facility];  Service: Endoscopy;  Laterality: N/A;  Diabetic - oral meds  . GASTRIC BYPASS  09/2009   [facility]   . Left Carotid to sublcavian artery bypass w/ subclavian artery ligation     a. Performed @ [place].  . MELANOMA EXCISION  2016   Dr. Evorn Gong  . [place]  2002  . RIGHT OOPHORECTOMY    . SHOULDER ARTHROSCOPY WITH OPEN ROTATOR CUFF REPAIR Right 01/07/2016   Procedure: SHOULDER ARTHROSCOPY WITH DEBRIDMENT, SUBACHROMIAL DECOMPRESSION;  Surgeon: Corky Mull, MD;  Location: [facility];  Service: Orthopedics;  Laterality: Right;  . TRIGGER FINGER RELEASE Right     Middle Finger  . TUBAL LIGATION     R tube removed still w/ Left    Gynecologic History:  No LMP recorded. Patient has had a uterine ablation Contraception: uterine ablation Last Pap: 10 years ago Results were: unknown Last mammogram: 1 year Results were: normal  Family History:  Family History  Problem Relation Age of Onset  . Hypertension Mother   . Anxiety disorder Mother   . Depression Mother   . Bipolar disorder Mother   . Heart disease Mother   . Hyperlipidemia Mother   . Kidney disease Father   .  Heart disease Father   . Hypertension Father   . Diabetes Father   . Stroke Father   . Colon cancer Father        dx in his 86's  . Anxiety disorder Father   . Depression Father   . Skin cancer Father   . Kidney disease Sister   . Thyroid nodules Sister   . Hypertension Sister   . Hypertension Sister   . Diabetes Sister   . Hyperlipidemia Sister   . Depression Sister   . Breast cancer Maternal Aunt   . Breast cancer Maternal Aunt   . Kidney cancer Neg Hx   . Bladder Cancer Neg Hx     Social History:  Social History   Social History  . Marital status: Married    Spouse name: N/A  . Number of children: 1  . Years of education: N/A   Occupational  History  . 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 Disabled through 08/02/2013 per forms 02/03/2013   Social History Main Topics  . Smoking status: Former Smoker    Types: Cigarettes    Quit date: 08/31/1994  . Smokeless tobacco: Never Used     Comment: quit 28 years ago  . Alcohol use No  . Drug use: No  . Sexual activity: Not Currently    Birth control/ protection: None   Other Topics Concern  . 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    Allergies:  No Known Allergies  Medications: Prior to Admission medications   Medication Sig Start Date End Date Taking? Authorizing Provider  ALPRAZolam Duanne Moron) 0.5 MG tablet Take 0.5-1 tablets (0.25-0.5 mg total) by mouth every 6 (six) hours as needed for anxiety or sleep. 06/05/16 08/04/16  [name], MD  Ascorbic Acid (VITAMIN C PO) Take by mouth daily.    [provider]  aspirin EC 81 MG tablet Take 1 tablet (81 mg total) by mouth daily. 02/19/15   Hildred Priest, MD  Biotin w/ Vitamins C & E (HAIR SKIN & NAILS GUMMIES PO) Take by mouth daily.    [provider]  citalopram  (CELEXA) 40 MG tablet TAKE ONE TABLET BY MOUTH ONCE DAILY 05/17/16   [name], MD  clopidogrel (PLAVIX) 75 MG tablet Take 1 tablet (75 mg total) by mouth daily. 02/27/16   Minna Merritts, MD  diltiazem (CARDIZEM CD) 120 MG 24 hr capsule Take 1 capsule (120 mg total) by mouth daily. 01/31/16   Minna Merritts, MD  eszopiclone (LUNESTA) 2 MG TABS tablet TAKE 1 TABLET BY MOUTH AT BEDTIME AS NEEDED FOR SLEEP 03/01/16   [name], MD  ezetimibe (ZETIA) 10 MG tablet Take 1 tablet (10 mg total) by mouth daily. 05/29/16   [name], MD  gabapentin (NEURONTIN) 300 MG capsule Take 300 mg by mouth at bedtime. 600 mg in the morning    [provider]  isosorbide mononitrate (IMDUR) 60 MG 24 hr tablet Take 1 tablet (60 mg total) by mouth daily. 12/19/15   Minna Merritts, MD  levothyroxine (SYNTHROID, LEVOTHROID) 25 MCG tablet Take 1 tablet (25 mcg total) by mouth daily before breakfast. 02/28/16   [name], MD  losartan (COZAAR) 50 MG tablet Take 1 tablet (50 mg total) by mouth daily. 01/14/16   [name], MD  metFORMIN (GLUCOPHAGE) 1000 MG tablet TAKE ONE TABLET BY MOUTH TWICE DAILY 03/19/16   [name], MD  metoprolol succinate (TOPROL-XL) 25 MG 24 hr tablet Take 1 tablet (25 mg total) by mouth daily. Take with or immediately following a meal. 02/27/16 02/26/17  Gollan, Kathlene November, MD  nitroGLYCERIN (NITROSTAT) 0.4 MG SL tablet Place 1 tablet (0.4 mg total) under the tongue every 5 (five) minutes as needed for chest pain. 04/30/15   Minna Merritts, MD  OVER THE COUNTER MEDICATION Take 2 tablets by mouth daily. B-12 gummies    [provider]  pantoprazole (PROTONIX) 40 MG tablet Take 1 tablet (40 mg total) by mouth 2 (two) times daily. 05/11/16   [name], MD  promethazine (PHENERGAN) 25 MG tablet Take 1 tablet (25 mg total) by mouth every 6 (six) hours as needed for  nausea or vomiting. Reported on 05/31/2015 11/15/15   [name], MD    rosuvastatin (CRESTOR) 40 MG tablet Take 1 tablet (40 mg total) by mouth daily at 6 PM. 04/30/15   Gollan, Kathlene November, MD  sucralfate (CARAFATE) 1 GM/10ML suspension Take 10 mLs (1 g total) by mouth 4 (four) times daily. 04/09/16   [name], MD  traZODone (DESYREL) 100 MG tablet Take 2 tablets (200 mg total) by mouth at bedtime. 12/02/15   [name], MD    Physical Exam Vitals: Blood pressure 138/82, pulse 66, height 5\' 3"  (1.6 m), weight 161 lb (73 kg).  General: NAD HEENT: normocephalic, anicteric Thyroid: no enlargement, no palpable nodules Pulmonary: No increased work of breathing, CTAB Cardiovascular: RRR, distal pulses 2+ Breast: Breast symmetrical, no tenderness, no palpable nodules or masses, no skin or nipple retraction present, no nipple discharge.  No axillary or supraclavicular lymphadenopathy. Abdomen: NABS, soft, non-tender, non-distended.  Umbilicus without lesions.  No hepatomegaly, splenomegaly or masses palpable. No evidence of hernia  Genitourinary:  External: Normal external female genitalia.  Normal urethral meatus, normal  Bartholin's and Skene's glands.    Vagina: Normal vaginal mucosa, no evidence of prolapse.    Cervix: Grossly normal in appearance, no bleeding, no CMT  Uterus: Non-enlarged, mobile, normal contour.    Adnexa: ovaries non-enlarged, no adnexal masses  Rectal: deferred  Lymphatic: no evidence of inguinal lymphadenopathy Extremities: no edema, erythema, or tenderness Neurologic: Grossly intact Psychiatric: mood appropriate, affect full    Assessment: 46 y.o. G7P1 Well woman exam with PAP smear.   Plan: Problem List Items Addressed This Visit    None    Visit Diagnoses    Well woman exam with routine gynecological exam    -  Primary   Relevant Orders   FSH/LH   Testosterone   IGP,CtNgTv,Apt HPV,rfx16/18,45   Cervical cancer screening       Relevant Orders   IGP,CtNgTv,Apt HPV,rfx16/18,45   Screen for sexually transmitted  diseases       Relevant Orders   IGP,CtNgTv,Apt HPV,rfx16/18,45      1) Mammogram - recommend yearly screening mammogram.  Mammogram patient to schedule   2) STI screening was offered and accepted  3) ASCCP guidelines and rational discussed.  Patient opts for 3-5 year screening interval  4) Increase healthy lifestyle diet, hydration and exercise  5) Routine healthcare maintenance including cholesterol, diabetes screening discussed managed by PCP  6) Return in 1 year for annual   Rod Can, [place]

## 2016-07-11 LAB — TESTOSTERONE: Testosterone: 37 ng/dL (ref 8–48)

## 2016-07-11 LAB — FSH/LH
FSH: 31 m[IU]/mL
LH: 47.4 m[IU]/mL

## 2016-07-15 ENCOUNTER — Telehealth: Payer: Self-pay

## 2016-07-15 LAB — IGP,CTNGTV,APT HPV,RFX16/18,45
CHLAMYDIA, NUC. ACID AMP: NEGATIVE
GONOCOCCUS, NUC. ACID AMP: NEGATIVE
HPV APTIMA: NEGATIVE
PAP Smear Comment: 0
TRICH VAG BY NAA: NEGATIVE

## 2016-07-15 NOTE — Telephone Encounter (Signed)
Pt calling for lab results from last Fri. [phone]

## 2016-07-21 ENCOUNTER — Other Ambulatory Visit: Payer: Self-pay | Admitting: Advanced Practice Midwife

## 2016-07-21 NOTE — Telephone Encounter (Signed)
Patient is requesting HRT for post menopausal symptoms. Consult with Dr Kenton Kingfisher regarding appropriate plan of care.

## 2016-07-23 NOTE — Progress Notes (Deleted)
[facility]  Telephone:([phone]) [phone] Fax:(336) [phone]  ID: [name] OB: 12-01-1970  MR#: [id number]  AOZ#:[id number]  Patient Care Team: [name], MD as PCP - General (Family Medicine) Dagoberto Ligas, MD as Consulting Physician (Nephrology) Minna Merritts, MD as Consulting Physician (Cardiology) Vladimir Crofts, MD as Consulting Physician (Neurology) Irene Shipper, MD as Consulting Physician (Gastroenterology)  CHIEF COMPLAINT: Iron deficiency anemia.  INTERVAL HISTORY: Patient is here today for labs and further evaluation and consideration of IV feraheme. She was admitted overnight recently for hypertensive urgency and chest pain. She currently feels well and is back to her baseline. She has no neurologic complaints. She denies any recent fevers or illnesses. She has a good appetite and denies weight loss. She denies any chest pain or shortness of breath. She denies any nausea, vomiting, constipation, or diarrhea. She has no melena or hematochezia. She has no urinary complaints. Patient offers no further specific complaints.  REVIEW OF SYSTEMS:   Review of Systems  Constitutional: Positive for malaise/fatigue. Negative for fever and weight loss.  Respiratory: Negative.  Negative for cough and shortness of breath.   Cardiovascular: Negative.  Negative for chest pain, palpitations and leg swelling.  Gastrointestinal: Negative.  Negative for blood in stool and melena.  Genitourinary: Negative.   Musculoskeletal: Negative.   Neurological: Positive for weakness. Negative for sensory change.  Psychiatric/Behavioral: Negative.  The patient is not nervous/anxious.     As per HPI. Otherwise, a complete review of systems is negative.  PAST MEDICAL HISTORY: Past Medical History:  Diagnosis Date  . Anemia    iron deficiency anemia  . Anxiety   . Aortic arch aneurysm ([place])   . Arthritis   . Bipolar disorder ([place])   . CAD (coronary artery disease)      a. cath 08/2003 nonobs dzs;  b. 11/2014: mLAD 85-90% s/p PCI/DES (Xience 3.25 x 23); c. cath 04/2015: 70% mD1 @ bifurcation. Patent LAD stent - > Med Rx.; d. Cath-PCI 5/17: Patent LAD stent, D1 now ~80% - PCI Resolute DES 2.25 x 12.    . Chronic systolic CHF (congestive heart failure) ([place])    a. echo 01/29/2013 EF 40-45%, basal anteroseptal & mid anteroseptal segments abnl; b echo 05/2012 EF 50-55%, mildly dilated LA, nl RVSP; c. 10/2015 Echo: EF 55-60%, mild conc LVH, no rwma.  . CKD (chronic kidney disease), stage IV ([place])   . Colon polyp   . CVA (cerebral vascular accident) ([place])    a. 06/2008-TEE nl LV fxn; b. 01/2013 - notes indicate short run of SVT at that time  . Depression   . Diabetes mellitus without complication ([place])   . Gastric ulcer 04/27/2011  . GERD (gastroesophageal reflux disease)   . Headache    Migraines  . Hyperlipidemia   . Hypertension   . Hypertensive heart disease   . Hypothyroidism   . Ischemic cardiomyopathy    a. EF prev 40-45% in 2014-->Improved to 55-60% (10/2015).  . Malignant melanoma of skin of scalp ([place])   . MI, acute, non ST segment elevation ([place])   . Morbid obesity ([place])    a. s/p gastric bypass.  . Multiple sclerosis ([place]) 2015  . Myocardial infarction ([place]) 06/03/2015  . Neuromuscular disorder ([place])   . Neuropathy   . Orthostatic hypotension   . S/P drug eluting coronary stent placement 06/04/2015  . Syncope     PAST SURGICAL HISTORY: Past Surgical History:  Procedure Laterality Date  . APPENDECTOMY    .  CARDIAC CATHETERIZATION N/A 11/09/2014   Procedure: Coronary Angiography;  Surgeon: Minna Merritts, MD;  Location: [facility];  Service: Cardiovascular;  Laterality: N/A;  . CARDIAC CATHETERIZATION N/A 11/12/2014   Procedure: Coronary Stent Intervention;  Surgeon: Isaias Cowman, MD;  Location: [facility];  Service: Cardiovascular;  Laterality: N/A;  . CARDIAC CATHETERIZATION N/A 04/18/2015   Procedure: Left  Heart Cath and Coronary Angiography;  Surgeon: Minna Merritts, MD;  Location: [facility];  Service: Cardiovascular;  Laterality: N/A;  . CARDIAC CATHETERIZATION Left 06/04/2015   Procedure: Left Heart Cath and Coronary Angiography;  Surgeon: Wellington Hampshire, MD;  Location: [facility];  Service: Cardiovascular;  Laterality: Left;  . CARDIAC CATHETERIZATION N/A 06/04/2015   Procedure: Coronary Stent Intervention;  Surgeon: Wellington Hampshire, MD;  Location: [place] [facility];  Service: Cardiovascular;  Laterality: N/A;  . CESAREAN SECTION  2001  . COLONOSCOPY WITH PROPOFOL N/A 04/27/2016   Procedure: COLONOSCOPY WITH PROPOFOL;  Surgeon: [name], MD;  Location: [facility];  Service: Endoscopy;  Laterality: N/A;  . CORONARY ANGIOPLASTY    . DILATION AND CURETTAGE OF UTERUS    . ESOPHAGOGASTRODUODENOSCOPY (EGD) WITH PROPOFOL N/A 09/14/2014   Procedure: ESOPHAGOGASTRODUODENOSCOPY (EGD) WITH PROPOFOL;  Surgeon: Josefine Class, MD;  Location: [facility];  Service: Endoscopy;  Laterality: N/A;  . ESOPHAGOGASTRODUODENOSCOPY (EGD) WITH PROPOFOL N/A 04/27/2016   Procedure: ESOPHAGOGASTRODUODENOSCOPY (EGD) WITH PROPOFOL;  Surgeon: [name], MD;  Location: [facility];  Service: Endoscopy;  Laterality: N/A;  Diabetic - oral meds  . GASTRIC BYPASS  09/2009   [facility]   . Left Carotid to sublcavian artery bypass w/ subclavian artery ligation     a. Performed @ [facility].  . MELANOMA EXCISION  2016   Dr. Evorn Gong  . [place]  2002  . RIGHT OOPHORECTOMY    . SHOULDER ARTHROSCOPY WITH OPEN ROTATOR CUFF REPAIR Right 01/07/2016   Procedure: SHOULDER ARTHROSCOPY WITH DEBRIDMENT, SUBACHROMIAL DECOMPRESSION;  Surgeon: Corky Mull, MD;  Location: [facility];  Service: Orthopedics;  Laterality: Right;  . TRIGGER FINGER RELEASE Right     Middle Finger  . TUBAL LIGATION     R tube removed still w/ Left    FAMILY HISTORY Family History   Problem Relation Age of Onset  . Hypertension Mother   . Anxiety disorder Mother   . Depression Mother   . Bipolar disorder Mother   . Heart disease Mother   . Hyperlipidemia Mother   . Kidney disease Father   . Heart disease Father   . Hypertension Father   . Diabetes Father   . Stroke Father   . Colon cancer Father        dx in his 21's  . Anxiety disorder Father   . Depression Father   . Skin cancer Father   . Kidney disease Sister   . Thyroid nodules Sister   . Hypertension Sister   . Hypertension Sister   . Diabetes Sister   . Hyperlipidemia Sister   . Depression Sister   . Breast cancer Maternal Aunt   . Breast cancer Maternal Aunt   . Kidney cancer Neg Hx   . Bladder Cancer Neg Hx        ADVANCED DIRECTIVES:    HEALTH MAINTENANCE: Social History  Substance Use Topics  . Smoking status: Former Smoker    Types: Cigarettes    Quit date: 08/31/1994  . Smokeless tobacco: Never  Used     Comment: quit 28 years ago  . Alcohol use No     No Known Allergies  Current Outpatient Prescriptions  Medication Sig Dispense Refill  . ALPRAZolam (XANAX) 0.5 MG tablet Take 0.5-1 tablets (0.25-0.5 mg total) by mouth every 6 (six) hours as needed for anxiety or sleep. 60 tablet 1  . Ascorbic Acid (VITAMIN C PO) Take by mouth daily.    Marland Kitchen aspirin EC 81 MG tablet Take 1 tablet (81 mg total) by mouth daily.    . Biotin w/ Vitamins C & E (HAIR SKIN & NAILS GUMMIES PO) Take by mouth daily.    . citalopram (CELEXA) 40 MG tablet TAKE ONE TABLET BY MOUTH ONCE DAILY 30 tablet 5  . clopidogrel (PLAVIX) 75 MG tablet Take 1 tablet (75 mg total) by mouth daily. 30 tablet 11  . diltiazem (CARDIZEM CD) 120 MG 24 hr capsule Take 1 capsule (120 mg total) by mouth daily. 90 capsule 3  . eszopiclone (LUNESTA) 2 MG TABS tablet TAKE 1 TABLET BY MOUTH AT BEDTIME AS NEEDED FOR SLEEP 30 tablet 5  . ezetimibe (ZETIA) 10 MG tablet Take 1 tablet (10 mg total) by mouth daily. 30 tablet 5  . gabapentin  (NEURONTIN) 300 MG capsule Take 300 mg by mouth at bedtime. 600 mg in the morning    . isosorbide mononitrate (IMDUR) 60 MG 24 hr tablet Take 1 tablet (60 mg total) by mouth daily. 30 tablet 6  . levothyroxine (SYNTHROID, LEVOTHROID) 25 MCG tablet Take 1 tablet (25 mcg total) by mouth daily before breakfast. 30 tablet 12  . losartan (COZAAR) 50 MG tablet Take 1 tablet (50 mg total) by mouth daily. 30 tablet 12  . metFORMIN (GLUCOPHAGE) 1000 MG tablet TAKE ONE TABLET BY MOUTH TWICE DAILY 60 tablet 12  . metoprolol succinate (TOPROL-XL) 25 MG 24 hr tablet Take 1 tablet (25 mg total) by mouth daily. Take with or immediately following a meal. 30 tablet 11  . nitroGLYCERIN (NITROSTAT) 0.4 MG SL tablet Place 1 tablet (0.4 mg total) under the tongue every 5 (five) minutes as needed for chest pain. 25 tablet 6  . OVER THE COUNTER MEDICATION Take 2 tablets by mouth daily. B-12 gummies    . pantoprazole (PROTONIX) 40 MG tablet Take 1 tablet (40 mg total) by mouth 2 (two) times daily. 60 tablet 5  . promethazine (PHENERGAN) 25 MG tablet Take 1 tablet (25 mg total) by mouth every 6 (six) hours as needed for nausea or vomiting. Reported on 05/31/2015 30 tablet 3  . rosuvastatin (CRESTOR) 40 MG tablet Take 1 tablet (40 mg total) by mouth daily at 6 PM. 90 tablet 3  . sucralfate (CARAFATE) 1 GM/10ML suspension Take 10 mLs (1 g total) by mouth 4 (four) times daily. 420 mL 1  . traZODone (DESYREL) 100 MG tablet Take 2 tablets (200 mg total) by mouth at bedtime. 30 tablet 6   No current facility-administered medications for this visit.     OBJECTIVE: There were no vitals filed for this visit.   There is no height or weight on file to calculate BMI.    ECOG FS:0 - Asymptomatic  General: Well-developed, well-nourished, no acute distress. Eyes: Pink conjunctiva, anicteric sclera. Lungs: Clear to auscultation bilaterally. Heart: Regular rate and rhythm. No rubs, murmurs, or gallops. Abdomen: Soft, nontender,  nondistended. No organomegaly noted, normoactive bowel sounds. Musculoskeletal: No edema, cyanosis, or clubbing. Neuro: Alert, answering all questions appropriately. Cranial nerves grossly intact. Skin: No  rashes or petechiae noted. Psych: Normal affect.  LAB RESULTS:  Lab Results  Component Value Date   NA 146 (H) 01/29/2016   K 4.2 01/29/2016   CL 103 01/29/2016   CO2 25 01/29/2016   GLUCOSE 98 01/29/2016   BUN 22 01/29/2016   CREATININE 1.49 (H) 01/29/2016   CALCIUM 9.6 01/29/2016   PROT 6.9 12/13/2015   ALBUMIN 4.1 01/29/2016   AST 21 12/13/2015   ALT 16 12/13/2015   ALKPHOS 73 12/13/2015   BILITOT 0.4 12/13/2015   GFRNONAA 42 (L) 01/29/2016   GFRAA 48 (L) 01/29/2016    Lab Results  Component Value Date   WBC 11.6 (H) 04/23/2016   NEUTROABS 8.6 (H) 04/23/2016   HGB 12.1 04/23/2016   HCT 37.1 04/23/2016   MCV 81.1 04/23/2016   PLT 242 04/23/2016   Lab Results  Component Value Date   IRON 24 (L) 04/23/2016   TIBC 252 04/23/2016   IRONPCTSAT 10 (L) 04/23/2016    Lab Results  Component Value Date   FERRITIN 38 04/23/2016     STUDIES: No results found.  ASSESSMENT: Iron deficiency anemia.  PLAN:    1. Iron deficiency anemia: Possibly secondary to poor absorption given patient's history of gastric bypass surgery. Patient's hemoglobin and iron stores continue to be within normal limits. Previous, her entire anemia workup was also either negative or within normal limits. Patient does not take oral iron.  No IV feraheme needed today. Patient last received IV iron in March 2017. Patient will return to clinic in 4 months with repeat laboratory work and further evaluation.  2. Hypertension: Patient's blood pressure improved from her admission this past weekend, but still elevated. Continue monitoring and treatment per primary care. 3. Chronic renal insufficiency: Patient's creatinine appears to be at her baseline. Continue monitoring treatment per  nephrology.   Patient expressed understanding and was in agreement with this plan. She also understands that She can call clinic at any time with any questions, concerns, or complaints.   Lloyd Huger, MD   07/23/2016 11:23 PM

## 2016-07-24 ENCOUNTER — Inpatient Hospital Stay: Payer: BC Managed Care – PPO

## 2016-07-24 ENCOUNTER — Inpatient Hospital Stay: Payer: BC Managed Care – PPO | Admitting: Oncology

## 2016-07-24 ENCOUNTER — Other Ambulatory Visit: Payer: Self-pay | Admitting: Family Medicine

## 2016-07-24 ENCOUNTER — Other Ambulatory Visit: Payer: Self-pay | Admitting: Cardiovascular Disease

## 2016-07-24 DIAGNOSIS — F5102 Adjustment insomnia: Secondary | ICD-10-CM

## 2016-07-24 DIAGNOSIS — F439 Reaction to severe stress, unspecified: Secondary | ICD-10-CM

## 2016-07-25 NOTE — Telephone Encounter (Signed)
Please call in alprazolam.  

## 2016-07-27 NOTE — Telephone Encounter (Signed)
Please advise.  Per last office visit Dr. [name] stated  "Recommend we avoid Lasix and NSAIDs"

## 2016-07-27 NOTE — Telephone Encounter (Signed)
Rx called in to pharmacy. 

## 2016-07-29 ENCOUNTER — Telehealth: Payer: Self-pay

## 2016-07-29 NOTE — Telephone Encounter (Signed)
Pt saw JEG two weeks ago and was supposed to have a mammogram sched and HRT called in.  Pt hasn't heard anything about either one of them.  [phone]

## 2016-07-30 ENCOUNTER — Other Ambulatory Visit: Payer: Self-pay | Admitting: Advanced Practice Midwife

## 2016-07-30 DIAGNOSIS — R232 Flushing: Secondary | ICD-10-CM

## 2016-07-30 MED ORDER — CLONIDINE HCL 0.1 MG/24HR TD PTWK: 0.1000 mg | MEDICATED_PATCH | TRANSDERMAL | 11 refills | Status: DC

## 2016-07-30 NOTE — Progress Notes (Signed)
[place]  Telephone:([phone]) [phone] Fax:(336) [phone]  ID: [name] OB: 1970-10-11  MR#: [id number]  IOX#:[id number]  Patient Care Team: [name], MD as PCP - General (Family Medicine) Dagoberto Ligas, MD as Consulting Physician (Nephrology) Minna Merritts, MD as Consulting Physician (Cardiology) Vladimir Crofts, MD as Consulting Physician (Neurology) Irene Shipper, MD as Consulting Physician (Gastroenterology)  CHIEF COMPLAINT: Iron deficiency anemia.  INTERVAL HISTORY: Patient returns to clinic today for repeat laboratory work and further evaluation. She has chronic weakness and fatigue, but otherwise feels well.  She has no neurologic complaints. She denies any recent fevers or illnesses. She has a good appetite and denies weight loss. She denies any chest pain or shortness of breath. She denies any nausea, vomiting, constipation, or diarrhea. She has no melena or hematochezia. She has no urinary complaints. Patient offers no further specific complaints today.  REVIEW OF SYSTEMS:   Review of Systems  Constitutional: Positive for malaise/fatigue. Negative for fever and weight loss.  Respiratory: Negative.  Negative for cough and shortness of breath.   Cardiovascular: Negative.  Negative for chest pain, palpitations and leg swelling.  Gastrointestinal: Negative.  Negative for blood in stool and melena.  Genitourinary: Negative.   Musculoskeletal: Negative.   Neurological: Positive for weakness. Negative for sensory change.  Psychiatric/Behavioral: Negative.  The patient is not nervous/anxious.     As per HPI. Otherwise, a complete review of systems is negative.  PAST MEDICAL HISTORY: Past Medical History:  Diagnosis Date  . Anemia    iron deficiency anemia  . Anxiety   . Aortic arch aneurysm ([place])   . Arthritis   . Bipolar disorder ([place])   . CAD (coronary artery disease)    a. cath 08/2003 nonobs dzs;  b. 11/2014: mLAD 85-90% s/p PCI/DES  (Xience 3.25 x 23); c. cath 04/2015: 70% mD1 @ bifurcation. Patent LAD stent - > Med Rx.; d. Cath-PCI 5/17: Patent LAD stent, D1 now ~80% - PCI Resolute DES 2.25 x 12.    . Chronic systolic CHF (congestive heart failure) ([place])    a. echo 01/29/2013 EF 40-45%, basal anteroseptal & mid anteroseptal segments abnl; b echo 05/2012 EF 50-55%, mildly dilated LA, nl RVSP; c. 10/2015 Echo: EF 55-60%, mild conc LVH, no rwma.  . CKD (chronic kidney disease), stage IV ([place])   . Colon polyp   . CVA (cerebral vascular accident) ([place])    a. 06/2008-TEE nl LV fxn; b. 01/2013 - notes indicate short run of SVT at that time  . Depression   . Diabetes mellitus without complication ([place])   . Gastric ulcer 04/27/2011  . GERD (gastroesophageal reflux disease)   . Headache    Migraines  . Hyperlipidemia   . Hypertension   . Hypertensive heart disease   . Hypothyroidism   . Ischemic cardiomyopathy    a. EF prev 40-45% in 2014-->Improved to 55-60% (10/2015).  . Malignant melanoma of skin of scalp ([place])   . MI, acute, non ST segment elevation ([place])   . Morbid obesity ([place])    a. s/p gastric bypass.  . Multiple sclerosis ([place]) 2015  . Myocardial infarction ([place]) 06/03/2015  . Neuromuscular disorder ([place])   . Neuropathy   . Orthostatic hypotension   . S/P drug eluting coronary stent placement 06/04/2015  . Syncope     PAST SURGICAL HISTORY: Past Surgical History:  Procedure Laterality Date  . APPENDECTOMY    . CARDIAC CATHETERIZATION N/A 11/09/2014   Procedure: Coronary  Angiography;  Surgeon: Minna Merritts, MD;  Location: [facility];  Service: Cardiovascular;  Laterality: N/A;  . CARDIAC CATHETERIZATION N/A 11/12/2014   Procedure: Coronary Stent Intervention;  Surgeon: Isaias Cowman, MD;  Location: [facility];  Service: Cardiovascular;  Laterality: N/A;  . CARDIAC CATHETERIZATION N/A 04/18/2015   Procedure: Left Heart Cath and Coronary Angiography;  Surgeon: Minna Merritts, MD;   Location: [facility];  Service: Cardiovascular;  Laterality: N/A;  . CARDIAC CATHETERIZATION Left 06/04/2015   Procedure: Left Heart Cath and Coronary Angiography;  Surgeon: Wellington Hampshire, MD;  Location: [facility];  Service: Cardiovascular;  Laterality: Left;  . CARDIAC CATHETERIZATION N/A 06/04/2015   Procedure: Coronary Stent Intervention;  Surgeon: Wellington Hampshire, MD;  Location: [facility];  Service: Cardiovascular;  Laterality: N/A;  . CESAREAN SECTION  2001  . COLONOSCOPY WITH PROPOFOL N/A 04/27/2016   Procedure: COLONOSCOPY WITH PROPOFOL;  Surgeon: [name], MD;  Location: [facility];  Service: Endoscopy;  Laterality: N/A;  . CORONARY ANGIOPLASTY    . DILATION AND CURETTAGE OF UTERUS    . ESOPHAGOGASTRODUODENOSCOPY (EGD) WITH PROPOFOL N/A 09/14/2014   Procedure: ESOPHAGOGASTRODUODENOSCOPY (EGD) WITH PROPOFOL;  Surgeon: Josefine Class, MD;  Location: [facility];  Service: Endoscopy;  Laterality: N/A;  . ESOPHAGOGASTRODUODENOSCOPY (EGD) WITH PROPOFOL N/A 04/27/2016   Procedure: ESOPHAGOGASTRODUODENOSCOPY (EGD) WITH PROPOFOL;  Surgeon: [name], MD;  Location: [facility];  Service: Endoscopy;  Laterality: N/A;  Diabetic - oral meds  . GASTRIC BYPASS  09/2009   [facility]   . Left Carotid to sublcavian artery bypass w/ subclavian artery ligation     a. Performed @ [facility].  . MELANOMA EXCISION  2016   Dr. Evorn Gong  . [place]  2002  . RIGHT OOPHORECTOMY    . SHOULDER ARTHROSCOPY WITH OPEN ROTATOR CUFF REPAIR Right 01/07/2016   Procedure: SHOULDER ARTHROSCOPY WITH DEBRIDMENT, SUBACHROMIAL DECOMPRESSION;  Surgeon: Corky Mull, MD;  Location: [facility];  Service: Orthopedics;  Laterality: Right;  . TRIGGER FINGER RELEASE Right     Middle Finger  . TUBAL LIGATION     R tube removed still w/ Left    FAMILY HISTORY Family History  Problem Relation Age of Onset  . Hypertension Mother   . Anxiety disorder  Mother   . Depression Mother   . Bipolar disorder Mother   . Heart disease Mother   . Hyperlipidemia Mother   . Kidney disease Father   . Heart disease Father   . Hypertension Father   . Diabetes Father   . Stroke Father   . Colon cancer Father        dx in his 42's  . Anxiety disorder Father   . Depression Father   . Skin cancer Father   . Kidney disease Sister   . Thyroid nodules Sister   . Hypertension Sister   . Hypertension Sister   . Diabetes Sister   . Hyperlipidemia Sister   . Depression Sister   . Breast cancer Maternal Aunt   . Breast cancer Maternal Aunt   . Kidney cancer Neg Hx   . Bladder Cancer Neg Hx        ADVANCED DIRECTIVES:    HEALTH MAINTENANCE: Social History  Substance Use Topics  . Smoking status: Former Smoker    Types: Cigarettes    Quit date: 08/31/1994  . Smokeless tobacco: Never Used     Comment: quit 28  years ago  . Alcohol use No     No Known Allergies  Current Outpatient Prescriptions  Medication Sig Dispense Refill  . ALPRAZolam (XANAX) 0.5 MG tablet TAKE 1/2 TO 1 TABLET BY MOUTH EVERY 6 HOURS AS NEEDED FOR ANXIETY OR SLEEP 60 tablet 2  . Ascorbic Acid (VITAMIN C PO) Take by mouth daily.    Marland Kitchen aspirin EC 81 MG tablet Take 1 tablet (81 mg total) by mouth daily.    . citalopram (CELEXA) 40 MG tablet TAKE ONE TABLET BY MOUTH ONCE DAILY 30 tablet 5  . cloNIDine (CATAPRES - DOSED IN MG/24 HR) 0.1 mg/24hr patch Place 1 patch (0.1 mg total) onto the skin every 7 (seven) days. 4 patch 11  . clopidogrel (PLAVIX) 75 MG tablet Take 1 tablet (75 mg total) by mouth daily. 30 tablet 11  . diltiazem (CARDIZEM CD) 120 MG 24 hr capsule Take 1 capsule (120 mg total) by mouth daily. 90 capsule 3  . eszopiclone (LUNESTA) 2 MG TABS tablet TAKE 1 TABLET BY MOUTH AT BEDTIME AS NEEDED FOR SLEEP 30 tablet 5  . ezetimibe (ZETIA) 10 MG tablet Take 1 tablet (10 mg total) by mouth daily. 30 tablet 5  . gabapentin (NEURONTIN) 300 MG capsule Take 300 mg by  mouth at bedtime. 600 mg in the morning    . isosorbide mononitrate (IMDUR) 60 MG 24 hr tablet Take 1 tablet (60 mg total) by mouth daily. 30 tablet 6  . levothyroxine (SYNTHROID, LEVOTHROID) 25 MCG tablet Take 1 tablet (25 mcg total) by mouth daily before breakfast. 30 tablet 12  . losartan (COZAAR) 50 MG tablet Take 1 tablet (50 mg total) by mouth daily. 30 tablet 12  . metFORMIN (GLUCOPHAGE) 1000 MG tablet TAKE ONE TABLET BY MOUTH TWICE DAILY 60 tablet 12  . metoprolol succinate (TOPROL-XL) 25 MG 24 hr tablet Take 1 tablet (25 mg total) by mouth daily. Take with or immediately following a meal. 30 tablet 11  . nitroGLYCERIN (NITROSTAT) 0.4 MG SL tablet Place 1 tablet (0.4 mg total) under the tongue every 5 (five) minutes as needed for chest pain. 25 tablet 6  . OVER THE COUNTER MEDICATION Take 2 tablets by mouth daily. B-12 gummies    . pantoprazole (PROTONIX) 40 MG tablet Take 1 tablet (40 mg total) by mouth 2 (two) times daily. 60 tablet 5  . promethazine (PHENERGAN) 25 MG tablet Take 1 tablet (25 mg total) by mouth every 6 (six) hours as needed for nausea or vomiting. Reported on 05/31/2015 30 tablet 3  . rosuvastatin (CRESTOR) 40 MG tablet Take 1 tablet (40 mg total) by mouth daily at 6 PM. 90 tablet 3  . traZODone (DESYREL) 100 MG tablet Take 2 tablets (200 mg total) by mouth at bedtime. 30 tablet 6  . Biotin w/ Vitamins C & E (HAIR SKIN & NAILS GUMMIES PO) Take by mouth daily.    . sucralfate (CARAFATE) 1 GM/10ML suspension Take 10 mLs (1 g total) by mouth 4 (four) times daily. (Patient not taking: Reported on 07/31/2016) 420 mL 1   No current facility-administered medications for this visit.     OBJECTIVE: Vitals:   07/31/16 1000  BP: (!) 116/98  Pulse: 67  Temp: (!) 96.8 F (36 C)     Body mass index is 28.87 kg/m.    ECOG FS:0 - Asymptomatic  General: Well-developed, well-nourished, no acute distress. Eyes: Pink conjunctiva, anicteric sclera. Lungs: Clear to auscultation  bilaterally. Heart: Regular rate and rhythm. No  rubs, murmurs, or gallops. Abdomen: Soft, nontender, nondistended. No organomegaly noted, normoactive bowel sounds. Musculoskeletal: No edema, cyanosis, or clubbing. Neuro: Alert, answering all questions appropriately. Cranial nerves grossly intact. Skin: No rashes or petechiae noted. Psych: Normal affect.  LAB RESULTS:  Lab Results  Component Value Date   NA 146 (H) 01/29/2016   K 4.2 01/29/2016   CL 103 01/29/2016   CO2 25 01/29/2016   GLUCOSE 98 01/29/2016   BUN 22 01/29/2016   CREATININE 1.49 (H) 01/29/2016   CALCIUM 9.6 01/29/2016   PROT 6.9 12/13/2015   ALBUMIN 4.1 01/29/2016   AST 21 12/13/2015   ALT 16 12/13/2015   ALKPHOS 73 12/13/2015   BILITOT 0.4 12/13/2015   GFRNONAA 42 (L) 01/29/2016   GFRAA 48 (L) 01/29/2016    Lab Results  Component Value Date   WBC 9.7 07/31/2016   NEUTROABS 7.1 (H) 07/31/2016   HGB 12.4 07/31/2016   HCT 37.0 07/31/2016   MCV 83.6 07/31/2016   PLT 192 07/31/2016   Lab Results  Component Value Date   IRON 40 07/31/2016   TIBC 205 (L) 07/31/2016   IRONPCTSAT 20 07/31/2016    Lab Results  Component Value Date   FERRITIN 88 07/31/2016     STUDIES: No results found.  ASSESSMENT: Iron deficiency anemia.  PLAN:    1. Iron deficiency anemia: Possibly secondary to poor absorption given patient's history of gastric bypass surgery. Patient's hemoglobin and iron stores continue to be within normal limits. Previous, her entire anemia workup was also either negative or within normal limits. Patient does not take oral iron.  No IV feraheme needed today. Patient last received IV iron in March 2017. Patient will return to clinic in 4 months with repeat laboratory work and further evaluation. If patient's lab work continues to be within normal limits at that time, can consider discharging from clinic. 2. Hypertension: Patient's blood pressure improved. Continue monitoring and treatment per  primary care. 3. Chronic renal insufficiency: Patient's creatinine appears to be at her baseline. Continue monitoring treatment per nephrology.   Patient expressed understanding and was in agreement with this plan. She also understands that She can call clinic at any time with any questions, concerns, or complaints.   Lloyd Huger, MD   08/03/2016 10:50 AM

## 2016-07-31 ENCOUNTER — Inpatient Hospital Stay: Payer: BC Managed Care – PPO

## 2016-07-31 ENCOUNTER — Inpatient Hospital Stay: Payer: BC Managed Care – PPO | Attending: Oncology | Admitting: Oncology

## 2016-07-31 VITALS — BP 116/98 | HR 67 | Temp 96.8°F | Wt 163.0 lb

## 2016-07-31 DIAGNOSIS — Z8673 Personal history of transient ischemic attack (TIA), and cerebral infarction without residual deficits: Secondary | ICD-10-CM | POA: Insufficient documentation

## 2016-07-31 DIAGNOSIS — I252 Old myocardial infarction: Secondary | ICD-10-CM | POA: Diagnosis not present

## 2016-07-31 DIAGNOSIS — Z79899 Other long term (current) drug therapy: Secondary | ICD-10-CM | POA: Diagnosis not present

## 2016-07-31 DIAGNOSIS — F329 Major depressive disorder, single episode, unspecified: Secondary | ICD-10-CM | POA: Insufficient documentation

## 2016-07-31 DIAGNOSIS — F419 Anxiety disorder, unspecified: Secondary | ICD-10-CM | POA: Diagnosis not present

## 2016-07-31 DIAGNOSIS — N184 Chronic kidney disease, stage 4 (severe): Secondary | ICD-10-CM | POA: Insufficient documentation

## 2016-07-31 DIAGNOSIS — N2889 Other specified disorders of kidney and ureter: Secondary | ICD-10-CM

## 2016-07-31 DIAGNOSIS — Z8742 Personal history of other diseases of the female genital tract: Secondary | ICD-10-CM | POA: Diagnosis not present

## 2016-07-31 DIAGNOSIS — R5383 Other fatigue: Secondary | ICD-10-CM | POA: Diagnosis not present

## 2016-07-31 DIAGNOSIS — I2510 Atherosclerotic heart disease of native coronary artery without angina pectoris: Secondary | ICD-10-CM | POA: Diagnosis not present

## 2016-07-31 DIAGNOSIS — Z86010 Personal history of colonic polyps: Secondary | ICD-10-CM | POA: Insufficient documentation

## 2016-07-31 DIAGNOSIS — Z7984 Long term (current) use of oral hypoglycemic drugs: Secondary | ICD-10-CM | POA: Diagnosis not present

## 2016-07-31 DIAGNOSIS — I509 Heart failure, unspecified: Secondary | ICD-10-CM | POA: Insufficient documentation

## 2016-07-31 DIAGNOSIS — D508 Other iron deficiency anemias: Secondary | ICD-10-CM

## 2016-07-31 DIAGNOSIS — H9319 Tinnitus, unspecified ear: Secondary | ICD-10-CM | POA: Diagnosis not present

## 2016-07-31 DIAGNOSIS — Z9884 Bariatric surgery status: Secondary | ICD-10-CM | POA: Insufficient documentation

## 2016-07-31 DIAGNOSIS — R531 Weakness: Secondary | ICD-10-CM | POA: Insufficient documentation

## 2016-07-31 DIAGNOSIS — I255 Ischemic cardiomyopathy: Secondary | ICD-10-CM | POA: Insufficient documentation

## 2016-07-31 DIAGNOSIS — E876 Hypokalemia: Secondary | ICD-10-CM | POA: Diagnosis not present

## 2016-07-31 DIAGNOSIS — Z7982 Long term (current) use of aspirin: Secondary | ICD-10-CM | POA: Diagnosis not present

## 2016-07-31 DIAGNOSIS — I129 Hypertensive chronic kidney disease with stage 1 through stage 4 chronic kidney disease, or unspecified chronic kidney disease: Secondary | ICD-10-CM | POA: Insufficient documentation

## 2016-07-31 DIAGNOSIS — D509 Iron deficiency anemia, unspecified: Secondary | ICD-10-CM | POA: Diagnosis present

## 2016-07-31 DIAGNOSIS — D649 Anemia, unspecified: Secondary | ICD-10-CM | POA: Diagnosis not present

## 2016-07-31 DIAGNOSIS — F319 Bipolar disorder, unspecified: Secondary | ICD-10-CM | POA: Insufficient documentation

## 2016-07-31 DIAGNOSIS — E119 Type 2 diabetes mellitus without complications: Secondary | ICD-10-CM | POA: Diagnosis not present

## 2016-07-31 DIAGNOSIS — F17210 Nicotine dependence, cigarettes, uncomplicated: Secondary | ICD-10-CM

## 2016-07-31 DIAGNOSIS — Z87891 Personal history of nicotine dependence: Secondary | ICD-10-CM | POA: Diagnosis not present

## 2016-07-31 DIAGNOSIS — Z923 Personal history of irradiation: Secondary | ICD-10-CM | POA: Diagnosis not present

## 2016-07-31 DIAGNOSIS — E785 Hyperlipidemia, unspecified: Secondary | ICD-10-CM | POA: Diagnosis not present

## 2016-07-31 DIAGNOSIS — Z85820 Personal history of malignant melanoma of skin: Secondary | ICD-10-CM | POA: Insufficient documentation

## 2016-07-31 DIAGNOSIS — Z800 Family history of malignant neoplasm of digestive organs: Secondary | ICD-10-CM | POA: Insufficient documentation

## 2016-07-31 DIAGNOSIS — K219 Gastro-esophageal reflux disease without esophagitis: Secondary | ICD-10-CM | POA: Insufficient documentation

## 2016-07-31 DIAGNOSIS — E1122 Type 2 diabetes mellitus with diabetic chronic kidney disease: Secondary | ICD-10-CM | POA: Insufficient documentation

## 2016-07-31 DIAGNOSIS — C519 Malignant neoplasm of vulva, unspecified: Secondary | ICD-10-CM | POA: Diagnosis not present

## 2016-07-31 DIAGNOSIS — G35 Multiple sclerosis: Secondary | ICD-10-CM | POA: Insufficient documentation

## 2016-07-31 DIAGNOSIS — E039 Hypothyroidism, unspecified: Secondary | ICD-10-CM | POA: Insufficient documentation

## 2016-07-31 DIAGNOSIS — D473 Essential (hemorrhagic) thrombocythemia: Secondary | ICD-10-CM

## 2016-07-31 DIAGNOSIS — Z803 Family history of malignant neoplasm of breast: Secondary | ICD-10-CM

## 2016-07-31 LAB — CBC WITH DIFFERENTIAL/PLATELET
BASOS PCT: 1 %
Basophils Absolute: 0.1 10*3/uL (ref 0–0.1)
EOS ABS: 0.2 10*3/uL (ref 0–0.7)
Eosinophils Relative: 2 %
HCT: 37 % (ref 35.0–47.0)
HEMOGLOBIN: 12.4 g/dL (ref 12.0–16.0)
LYMPHS ABS: 1.8 10*3/uL (ref 1.0–3.6)
Lymphocytes Relative: 19 %
MCH: 28 pg (ref 26.0–34.0)
MCHC: 33.5 g/dL (ref 32.0–36.0)
MCV: 83.6 fL (ref 80.0–100.0)
MONO ABS: 0.5 10*3/uL (ref 0.2–0.9)
MONOS PCT: 5 %
Neutro Abs: 7.1 10*3/uL — ABNORMAL HIGH (ref 1.4–6.5)
Neutrophils Relative %: 73 %
Platelets: 192 10*3/uL (ref 150–440)
RBC: 4.42 MIL/uL (ref 3.80–5.20)
RDW: 14.2 % (ref 11.5–14.5)
WBC: 9.7 10*3/uL (ref 3.6–11.0)

## 2016-07-31 LAB — IRON AND TIBC
IRON: 40 ug/dL (ref 28–170)
Saturation Ratios: 20 % (ref 10.4–31.8)
TIBC: 205 ug/dL — AB (ref 250–450)
UIBC: 165 ug/dL

## 2016-07-31 LAB — FERRITIN: FERRITIN: 88 ng/mL (ref 11–307)

## 2016-07-31 NOTE — Progress Notes (Signed)
Patient denies any concerns today.  

## 2016-07-31 NOTE — Telephone Encounter (Signed)
Spoke with patient and told her Rx sent for clonidine for hot flash relief based on recommendations of Dr's Harris and Risk manager.

## 2016-07-31 NOTE — Telephone Encounter (Signed)
Hormone med was supposed to be called in for pt.  Pt states Clonidine was called in.  Per pharm, she is on enough BP med.  Clonidine has nothing to do c hormones.  [phone]

## 2016-08-11 IMAGING — MR MR CERVICAL SPINE WO/W CM
8 of 9 series · 42 of 48 positions shown · IV contrast (multihance)
Comparison: MRI head 06/19/2014

CLINICAL DATA: Urinary incontinence.  History of melanoma.

EXAM:
MRI CERVICAL SPINE WITHOUT AND WITH CONTRAST
TECHNIQUE: Multiplanar and multiecho pulse sequences of the cervical spine, to
include the craniocervical junction and cervicothoracic junction,
were obtained according to standard protocol without and with
intravenous contrast.
CONTRAST:  8mL MULTIHANCE GADOBENATE DIMEGLUMINE 529 MG/ML IV SOLN

[Series 2: T2 · sagittal · 3.0mm · 0.56mm/px · 4 of 13 slices shown (1 of 2)]
[im 1/13]
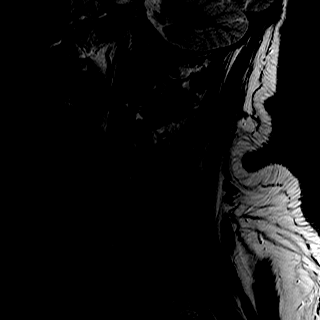
[im 5/13]
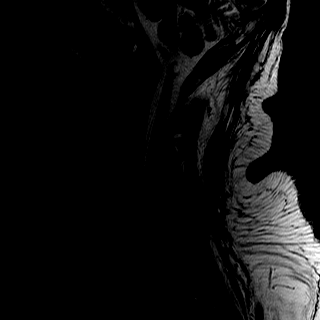
[im 9/13]
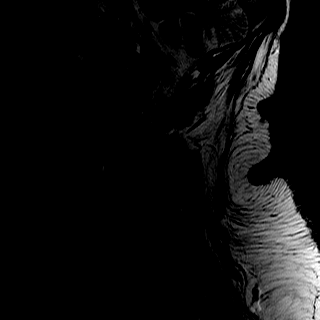
[im 13/13]
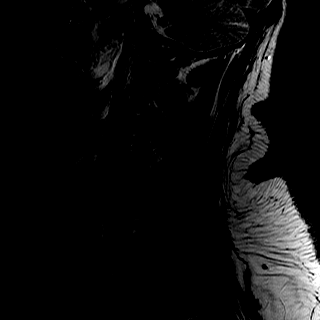

[Series 3: T1 · sagittal · 3.0mm · 0.70mm/px · 4 of 13 slices shown (1 of 3)]
[im 1/13]
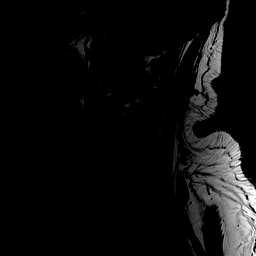
[im 5/13]
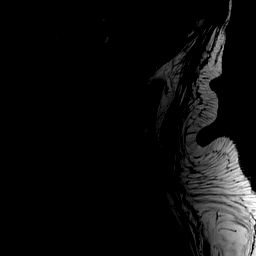
[im 9/13]
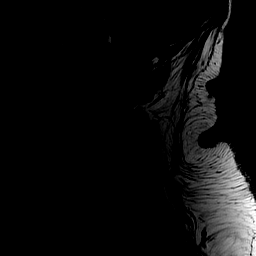
[im 13/13]
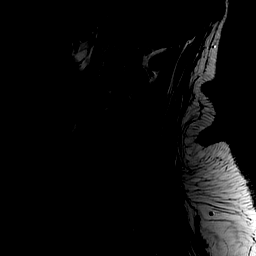

[Series 4: STIR · sagittal · 3.0mm · 0.70mm/px · 3 of 13 slices shown]
[im 1/13]
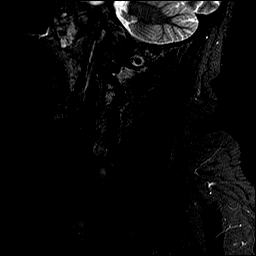
[im 7/13]
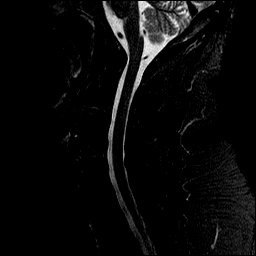
[im 13/13]
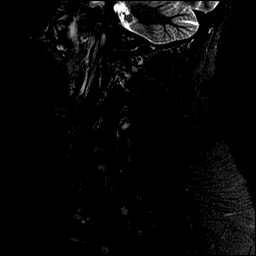

[Series 8: T2 · axial · 3.0mm · 0.70mm/px · z∈[-32,+74]mm · 7 of 28 slices shown (2 of 2)]
[im 1/28]
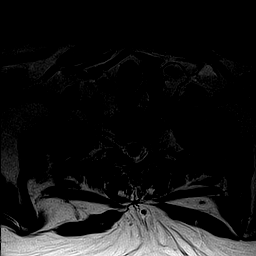
[im 5/28]
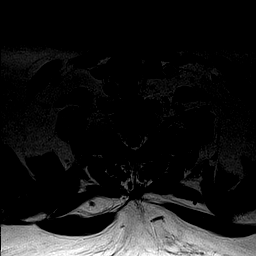
[im 10/28]
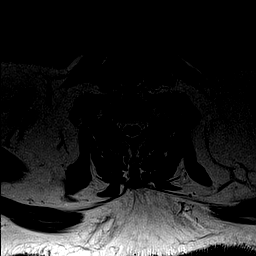
[im 14/28]
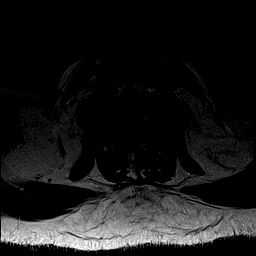
[im 19/28]
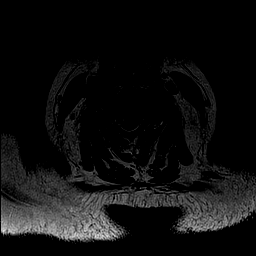
[im 23/28]
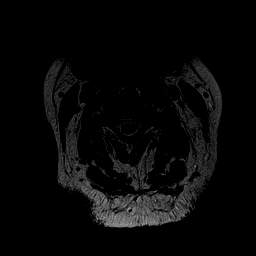
[im 28/28]
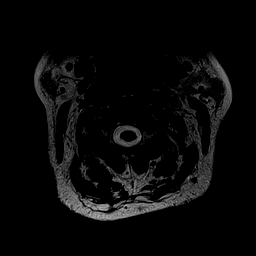

[Series 9: mpgr ax · axial · 3.0mm · 0.35mm/px · z∈[-34,+29]mm · 5 of 29 slices shown]
[im 1/29]
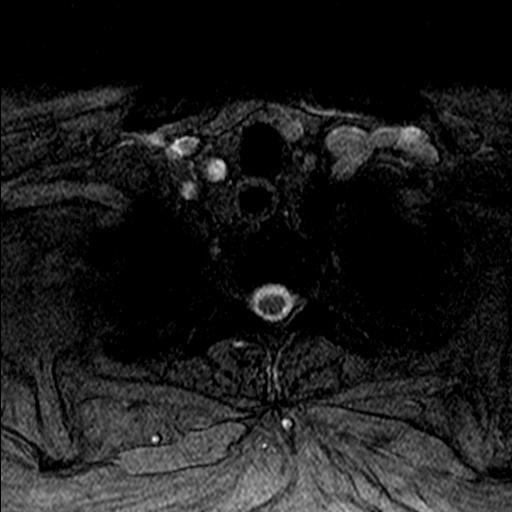
[im 5/29]
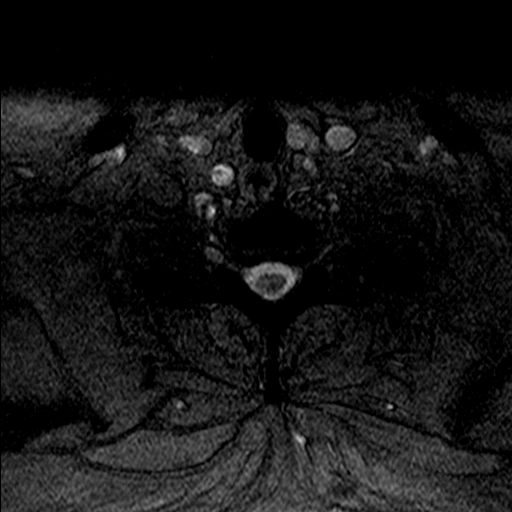
[im 9/29]
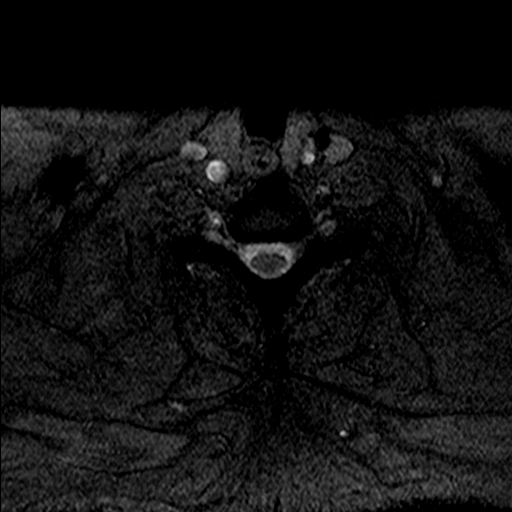
[im 13/29]
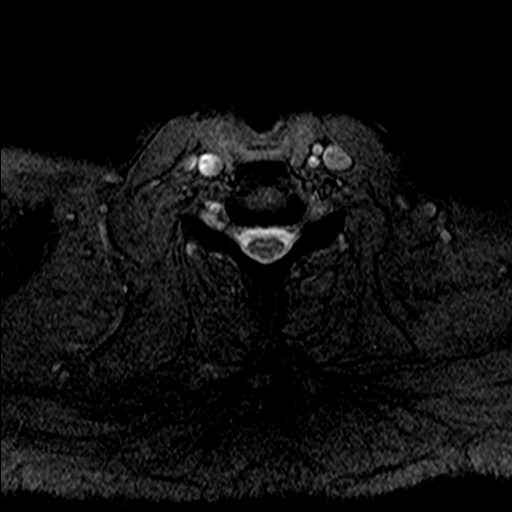
[im 17/29]
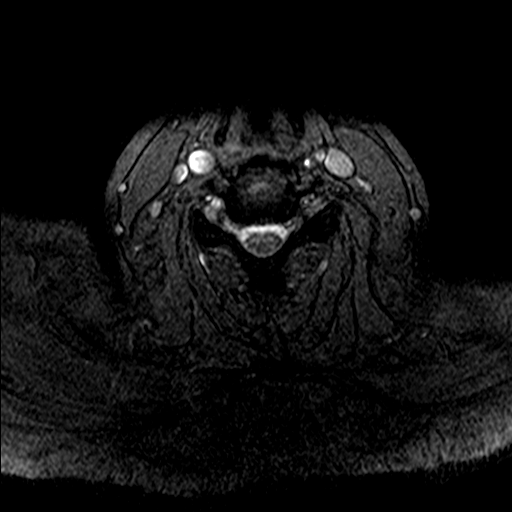

[Series 10: T1 · axial · non-contrast · 3.0mm · 0.56mm/px · z∈[-34,+76]mm · 8 of 29 slices shown (2 of 3)]
[im 1/29]
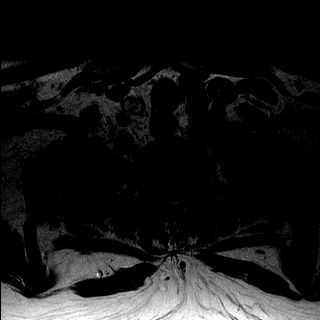
[im 5/29]
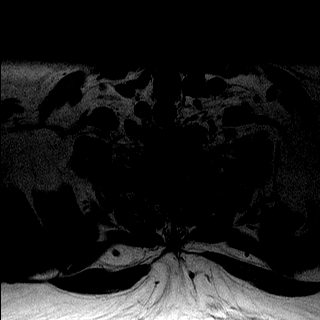
[im 9/29]
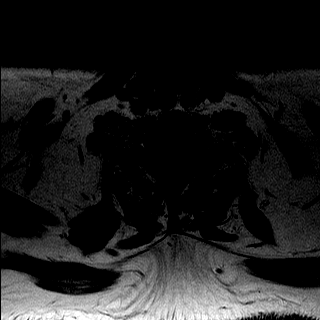
[im 13/29]
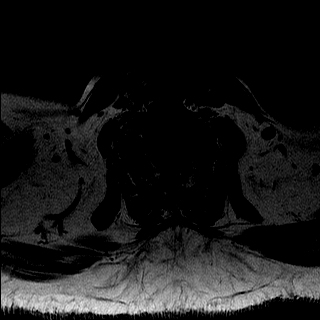
[im 17/29]
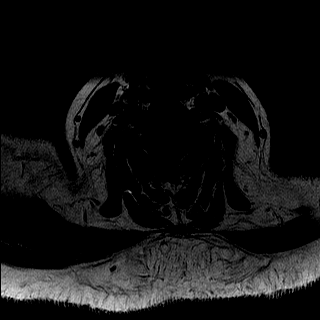
[im 21/29]
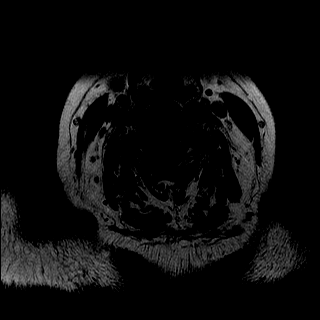
[im 25/29]
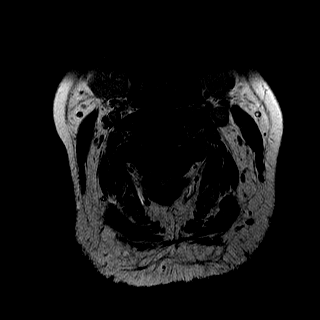
[im 29/29]
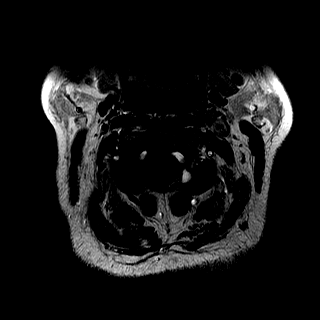

[Series 13: T1 fat-sat · sagittal · 3.0mm · 0.70mm/px · 3 of 13 slices shown]
[im 1/13]
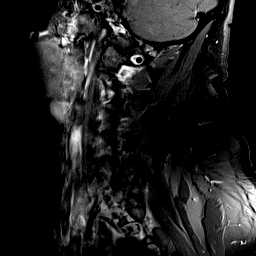
[im 7/13]
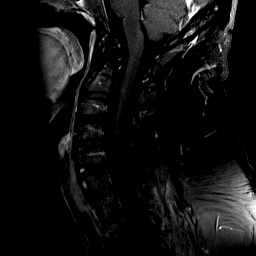
[im 13/13]
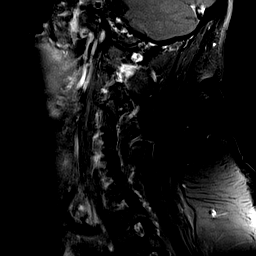

[Series 15: T1 · axial · 3.0mm · 0.56mm/px · z∈[-34,+76]mm · 8 of 29 slices shown (3 of 3)]
[im 1/29]
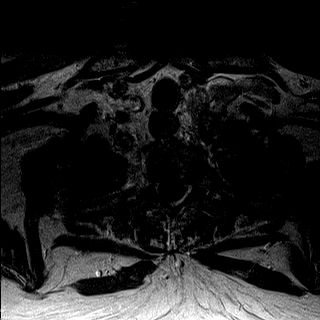
[im 5/29]
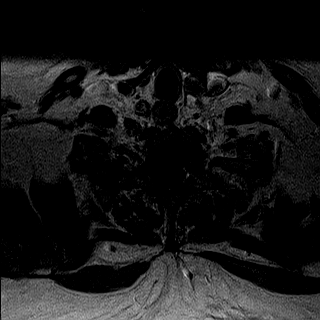
[im 9/29]
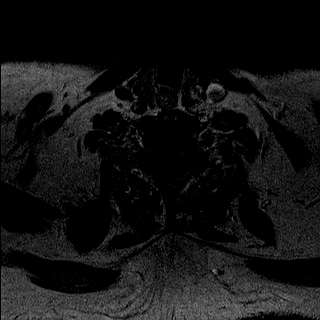
[im 13/29]
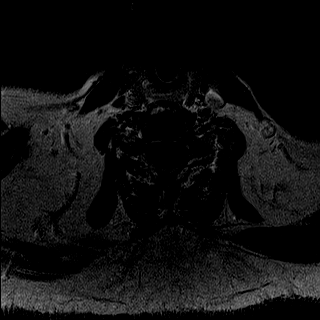
[im 17/29]
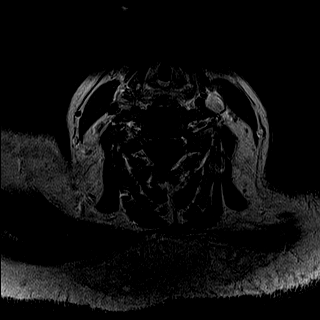
[im 21/29]
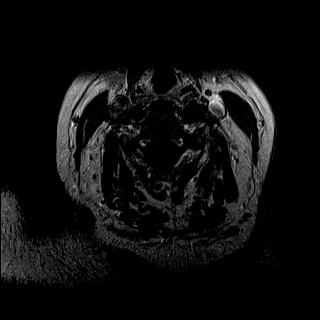
[im 25/29]
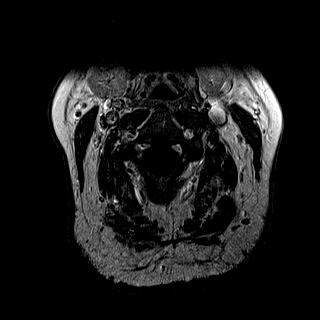
[im 29/29]
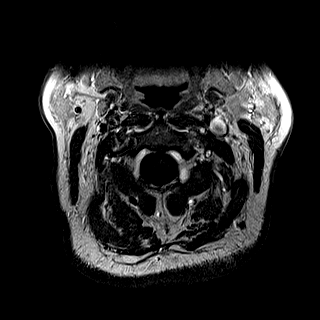

[42 of 48 positions shown; findings below may reference images not displayed]

FINDINGS: Image quality degraded by motion.

Normal alignment. Negative for fracture or mass lesion. Normal
enhancement following contrast infusion

No cord lesion identified. Cord evaluation is limited by motion.
Cervical medullary junction normal. Hyperintensity in the pons as
noted on MRI of the brain.

C2-3:  Negative

C3-4: Mild disc degeneration with early uncinate spurring. No
significant spinal stenosis.

C4-5:  Mild disc degeneration and disc bulging without stenosis

C5-6: Mild disc degeneration and disc bulging without stenosis. Mild
facet degeneration

C6-7:  Negative

C7-T1:  Negative
IMPRESSION: Image quality degraded by motion. Allowing for this, no acute
abnormality. No cord lesion identified. Mild degenerative change of
the cervical spine without spinal stenosis.

## 2016-08-25 ENCOUNTER — Telehealth: Payer: Self-pay

## 2016-08-25 NOTE — Telephone Encounter (Signed)
Pt saw JEG for breast lump.  JEG was going to ref her for a mammogram.  Pt hasn't heard anything about it yet.  Says lump feels a little bigger.  [phone]

## 2016-08-25 NOTE — Progress Notes (Deleted)
Cardiology Office Note  Date:  08/25/2016   ID:  [id number] [place] 1970-04-23, MRN [id number]  PCP:  [name], MD   No chief complaint on file.   HPI:  46 year old woman with a hx of  smoking,  numerous prior strokes,   chest pain dating back to 2005  cardiac catheterization on August 09, 2003 found to have mild plaque in the LAD, no obstructive disease noted.    CVA in May 2010,   normal LV function,  s/p gastric bypass surgery,  Baseline creatinine 1.5, previously taken off her diuretic Catheterization May 2017 with stent placed to her first diagonal, there was residual 50% distal LAD disease, 30% proximal, 40% proximal circumflex Who presents for routine followup of her coronary artery disease  In follow-up today she reports that she is having Gi issues, trouble swallowing She's had symptoms for several years, perhaps worse recently Will eat only once a day Hx of gastric bypass  Anticoagulation/Plavix held for surgery right shoulder 01/07/2016  We did receive phone calls yesterday that blood pressure and heart rate were elevated She reported having tachycardia, heart rate up to 140 bpm Tachycardia "comes and goes" Previous event monitor showing sinus tachycardia up to 160 Yesterday the symptoms lasted for 10 min, then heart rate improved down to 112 bpm About daily, 2 to 3 times a day Worse with exertion, cleaning  On today's visit, and eyes having significant chest pain Cardiac catheterization   results reviewed with her from  March 2017showing moderate LAD disease, 70% diagonal disease small vessel, near the ostium,  Decision was made to treat this medically  Reports having some periodic ankle swelling  EKG on today's visit shows normal sinus rhythm with rate 73 bpm, nonspecific ST and T wave abnormality anterolateral leads, inferior leads   other past medical history reviewed Recent carotid ultrasound reviewed with her showing minimal bilateral carotid  disease Recent echocardiogram reviewed with her showing essentially normal ejection fraction  She is status post left carotid to subclavian artery bypass with subclavian artery ligation, done at [facility] by vascular surgery admission to [facility] from 10/5 to 11/13/14 with unstable angina and was found to have severe mid LAD lesion at 85-90% stenosis on diagnositc cardiac cath s/p PCI/DES on staged procedure.   CT in October 2015 showing Right aortic arch with a large retro- esophageal vascular segment, persistent dorsal aortic segment) giving off aberrant left subclavian artery. This large caliber retroaortic segment measuring 18 mm compared to the ascending aorta which measures 22 mm. This large retroesophageal aortic segment along with the partially calcified atretic ductus ligament between the left aberrant subclavian artery and the left pulmonary artery creates an ALMOSTcomplete vascular ring. This is likelythe cause ofpatient's dysphagia [place].  Previously was taking midodrine or Florinef for hypotension following her stroke.  She previously presented to the hospital December 18, 2010 with chest pain.   stress test in the hospital showed no ischemia. Cardiac enzymes were negative. No EKG changes noted.   admission to the hospital 11/01/2012 with discharge 11/02/2012 with chest pain. She ruled out with negative cardiac enzymes. Blood pressures were in the 382-505 systolic range. Discharge blood pressure medications included amlodipine 10 mg daily, benazepril 40 mg daily, clonidine 0.2 mg., Coreg 3.125 mg twice a day  stroke and admission to the hospital at the end of December 2014. She had acute onset left sided weakness including arm and leg. She reports that review of MRI showed acute stroke, as  well as previous strokes. She was seen by neurology and aspirin changed to Plavix 75 mg daily. Hemoglobin A1c 8.5. Notes indicate short run of SVT while she was in the hospital and on the  monitor.  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[place]); Arthritis; Bipolar disorder ([place]); CAD (coronary artery disease); Chronic systolic CHF (congestive heart failure) ([place]); CKD (chronic kidney disease), stage IV ([place]); Colon polyp; CVA (cerebral vascular accident) ([place]); Depression; Diabetes mellitus without complication ([place]); Gastric ulcer (04/27/2011); GERD (gastroesophageal reflux disease); Headache; Hyperlipidemia; Hypertension; Hypertensive heart disease; Hypothyroidism; Ischemic cardiomyopathy; Malignant melanoma of skin of scalp ([place]); MI, acute, non ST segment elevation ([place]); Morbid obesity ([place]); Multiple sclerosis ([place]) (2015); Myocardial infarction ([place]) (06/03/2015); Neuromuscular disorder ([place]); Neuropathy; Orthostatic hypotension; S/P drug eluting coronary stent placement (06/04/2015); and Syncope.  PSH:    Past Surgical History:  Procedure Laterality Date  . APPENDECTOMY    . CARDIAC CATHETERIZATION N/A 11/09/2014   Procedure: Coronary Angiography;  Surgeon: Minna Merritts, MD;  Location: [place] [facility];  Service: Cardiovascular;  Laterality: N/A;  . CARDIAC CATHETERIZATION N/A 11/12/2014   Procedure: Coronary Stent Intervention;  Surgeon: Isaias Cowman, MD;  Location: [place] [facility];  Service: Cardiovascular;  Laterality: N/A;  . CARDIAC CATHETERIZATION N/A 04/18/2015   Procedure: Left Heart Cath and Coronary Angiography;  Surgeon: Minna Merritts, MD;   Location: [place] [facility];  Service: Cardiovascular;  Laterality: N/A;  . CARDIAC CATHETERIZATION Left 06/04/2015   Procedure: Left Heart Cath and Coronary Angiography;  Surgeon: Wellington Hampshire, MD;  Location: [place] [facility];  Service: Cardiovascular;  Laterality: Left;  . CARDIAC CATHETERIZATION N/A 06/04/2015   Procedure: Coronary Stent Intervention;  Surgeon: Wellington Hampshire, MD;  Location: [place] [facility];  Service: Cardiovascular;  Laterality: N/A;  . CESAREAN SECTION  2001  . COLONOSCOPY WITH PROPOFOL N/A 04/27/2016   Procedure: COLONOSCOPY WITH PROPOFOL;  Surgeon: [name], MD;  Location: [place];  Service: Endoscopy;  Laterality: N/A;  . CORONARY ANGIOPLASTY    . DILATION AND CURETTAGE OF UTERUS    . ESOPHAGOGASTRODUODENOSCOPY (EGD) WITH PROPOFOL N/A 09/14/2014   Procedure: ESOPHAGOGASTRODUODENOSCOPY (EGD) WITH PROPOFOL;  Surgeon: Josefine Class, MD;  Location: [facility]COPY;  Service: Endoscopy;  Laterality: N/A;  . ESOPHAGOGASTRODUODENOSCOPY (EGD) WITH PROPOFOL N/A 04/27/2016   Procedure: ESOPHAGOGASTRODUODENOSCOPY (EGD) WITH PROPOFOL;  Surgeon: [name], MD;  Location: [place];  Service: Endoscopy;  Laterality: N/A;  Diabetic - oral meds  . GASTRIC BYPASS  09/2009   [facility]   . Left Carotid to sublcavian artery bypass w/ subclavian artery ligation     a. Performed @ [facility].  . MELANOMA EXCISION  2016   Dr. Evorn Gong  . [place]  2002  . RIGHT OOPHORECTOMY    . SHOULDER ARTHROSCOPY WITH OPEN ROTATOR CUFF REPAIR Right 01/07/2016   Procedure: SHOULDER ARTHROSCOPY WITH DEBRIDMENT, SUBACHROMIAL DECOMPRESSION;  Surgeon: Corky Mull, MD;  Location: [facility] ORS;  Service: Orthopedics;  Laterality: Right;  . TRIGGER FINGER RELEASE Right  [place]  . TUBAL LIGATION     R tube removed still w/ Left    Current Outpatient Prescriptions  Medication Sig Dispense Refill  . ALPRAZolam (XANAX) 0.5 MG tablet TAKE  1/2 TO 1 TABLET BY MOUTH EVERY 6 HOURS AS NEEDED FOR ANXIETY OR SLEEP 60 tablet 2  . Ascorbic Acid (VITAMIN C PO) Take by mouth daily.    Marland Kitchen aspirin EC 81 MG tablet Take 1 tablet (81 mg total) by mouth daily.    . Biotin w/ Vitamins C & E (HAIR SKIN & NAILS GUMMIES PO) Take by mouth daily.    . citalopram (CELEXA) 40 MG tablet TAKE ONE TABLET BY MOUTH ONCE DAILY 30 tablet 5  . cloNIDine (CATAPRES - DOSED IN MG/24 HR) 0.1 mg/24hr patch Place 1 patch (0.1 mg total) onto the skin every 7 (seven) days. 4 patch 11  . clopidogrel (PLAVIX) 75 MG tablet Take 1 tablet (75 mg total) by mouth daily. 30 tablet 11  . diltiazem (CARDIZEM CD) 120 MG 24 hr capsule Take 1 capsule (120 mg total) by mouth daily. 90 capsule 3  . eszopiclone (LUNESTA) 2 MG TABS tablet TAKE 1 TABLET BY MOUTH AT BEDTIME AS NEEDED FOR SLEEP 30 tablet 5  . ezetimibe (ZETIA) 10 MG tablet Take 1 tablet (10 mg total) by mouth daily. 30 tablet 5  . gabapentin (NEURONTIN) 300 MG capsule Take 300 mg by mouth at bedtime. 600 mg in the morning    . isosorbide mononitrate (IMDUR) 60 MG 24 hr tablet Take 1 tablet (60 mg total) by mouth daily. 30 tablet 6  . levothyroxine (SYNTHROID, LEVOTHROID) 25 MCG tablet Take 1 tablet (25 mcg total) by mouth daily before breakfast. 30 tablet 12  . losartan (COZAAR) 50 MG tablet Take 1 tablet (50 mg total) by mouth daily. 30 tablet 12  . metFORMIN (GLUCOPHAGE) 1000 MG tablet TAKE ONE TABLET BY MOUTH TWICE DAILY 60 tablet 12  . metoprolol succinate (TOPROL-XL) 25 MG 24 hr tablet Take 1 tablet (25 mg total) by mouth daily. Take with or immediately following a meal. 30 tablet 11  . nitroGLYCERIN (NITROSTAT) 0.4 MG SL tablet Place 1 tablet (0.4 mg total) under the tongue every 5 (five) minutes as needed for chest pain. 25 tablet 6  . OVER THE COUNTER MEDICATION Take 2 tablets by mouth daily. B-12 gummies    . pantoprazole (PROTONIX) 40 MG tablet Take 1 tablet (40 mg total) by mouth 2 (two) times daily. 60 tablet 5  .  promethazine (PHENERGAN) 25 MG tablet Take 1 tablet (25 mg total) by mouth every 6 (six) hours as needed for nausea or vomiting. Reported on 05/31/2015 30 tablet 3  . rosuvastatin (CRESTOR) 40 MG tablet Take 1 tablet (40 mg total) by mouth daily at 6 PM. 90 tablet 3  . sucralfate (CARAFATE) 1 GM/10ML suspension Take 10 mLs (1 g total) by mouth 4 (four) times daily. (Patient not taking: Reported on 07/31/2016) 420 mL 1  . traZODone (DESYREL) 100 MG tablet Take 2 tablets (200 mg total) by mouth at bedtime. 30 tablet 6   No current facility-administered medications for this visit.      Allergies:   Patient has no known allergies.   Social History:  The patient  reports that she quit smoking about 22 years ago. Her smoking use included Cigarettes. She has never used smokeless tobacco. She reports that she does not drink alcohol or use drugs.   Family History:   family history  includes Anxiety disorder in her father and mother; Bipolar disorder in her mother; Breast cancer in her maternal aunt and maternal aunt; Colon cancer in her father; Depression in her father, mother, and sister; Diabetes in her father and sister; Heart disease in her father and mother; Hyperlipidemia in her mother and sister; Hypertension in her father, mother, sister, and sister; Kidney disease in her father and sister; Skin cancer in her father; Stroke in her father; Thyroid nodules in her sister.    Review of Systems: Review of Systems  Constitutional: Negative.   Respiratory: Negative.   Cardiovascular: Positive for chest pain and palpitations.       Tachycardia  Gastrointestinal: Negative.   Musculoskeletal: Negative.   Neurological: Negative.   Psychiatric/Behavioral: Negative.   All other systems reviewed and are negative.    PHYSICAL EXAM: VS:  There were no vitals taken for this visit. , BMI There is no height or weight on file to calculate BMI. GEN: Well nourished, well developed, in no acute distress   HEENT: normal  Neck: no JVD, carotid bruits, or masses Cardiac: RRR; no murmurs, rubs, or gallops,no edema  Respiratory:  clear to auscultation bilaterally, normal work of breathing GI: soft, nontender, nondistended, + BS MS: no deformity or atrophy  Skin: warm and dry, no rash Neuro:  Strength and sensation are intact Psych: euthymic mood, full affect    Recent Labs: 12/13/2015: ALT 16 12/14/2015: TSH 3.168 01/29/2016: BUN 22; Creatinine, Ser 1.49; Magnesium 1.7; Potassium 4.2; Sodium 146 07/31/2016: Hemoglobin 12.4; Platelets 192    Lipid Panel Lab Results  Component Value Date   CHOL 159 05/28/2016   HDL 34 (L) 05/28/2016   LDLCALC 95 05/28/2016   TRIG 148 05/28/2016      Wt Readings from Last 3 Encounters:  07/31/16 163 lb (73.9 kg)  07/10/16 161 lb (73 kg)  06/05/16 169 lb (76.7 kg)       ASSESSMENT AND PLAN:  Coronary artery disease involving native coronary artery of native heart with unstable angina pectoris ([place]) - Plan: EKG 12-Lead Currently with no symptoms of angina. No further workup at this time. Continue current medication regimen.we would recommend that she restart her Plavix. This was held prematurely for right shoulder surgery. Prior stent May 2017  Pure hypercholesterolemia Recommended she stay on her Crestor 40 mg daily Goal LDL less than 70  Uncontrolled type 2 diabetes mellitus with other circulatory complication, without long-term current use of insulin ([place]) We have encouraged continued exercise, careful diet management in an effort to lose weight. She reports that she has been losing weight recently  Essential hypertension Blood pressure is well controlled on today's visit.  For her episodes of tachycardia we will add metoprolol succinate 25 mg daily  Cerebrovascular accident (CVA), unspecified mechanism ([place])  Chronic kidney disease (CKD), stage III (moderate) Followed by nephrology, creatinine down to 1.5 Recommend we avoid Lasix  and NSAIDs  Sinus tachycardia Prior event monitor showing sinus tachycardia We will add Toprol succinate 25 mg daily, slowly increase the dose for symptom relief Previously was on Coreg in the past. This was held in the hospital for unclear reasons    Disposition:   F/U  6 months   No orders of the defined types were placed in this encounter.    Signed, Esmond Plants, M.D., Ph.D. 08/25/2016  [place], [place]

## 2016-08-26 ENCOUNTER — Ambulatory Visit: Payer: BC Managed Care – PPO | Admitting: Cardiovascular Disease

## 2016-08-28 NOTE — Telephone Encounter (Signed)
Pt is schedule 7/71/16 with Elmo Putt [place]land

## 2016-09-01 ENCOUNTER — Ambulatory Visit: Payer: BC Managed Care – PPO | Admitting: Obstetrics and Gynecology

## 2016-09-14 ENCOUNTER — Ambulatory Visit (INDEPENDENT_AMBULATORY_CARE_PROVIDER_SITE_OTHER): Payer: Medicare Other | Admitting: Obstetrics and Gynecology

## 2016-09-14 ENCOUNTER — Encounter: Payer: Self-pay | Admitting: Obstetrics and Gynecology

## 2016-09-14 VITALS — BP 126/84 | Ht 62.0 in | Wt 162.0 lb

## 2016-09-14 DIAGNOSIS — N6311 Unspecified lump in the right breast, upper outer quadrant: Secondary | ICD-10-CM | POA: Diagnosis not present

## 2016-09-14 DIAGNOSIS — N6321 Unspecified lump in the left breast, upper outer quadrant: Secondary | ICD-10-CM

## 2016-09-14 DIAGNOSIS — N6310 Unspecified lump in the right breast, unspecified quadrant: Secondary | ICD-10-CM

## 2016-09-14 DIAGNOSIS — N6320 Unspecified lump in the left breast, unspecified quadrant: Secondary | ICD-10-CM

## 2016-09-14 NOTE — Progress Notes (Signed)
Chief Complaint  Patient presents with  . Breast Pain     HPI:      [name] is a 46 y.o. G7P1 who LMP was No LMP recorded. Patient has had an ablation., presents today for breast symptoms. She complains of enlarging RT breast mass. It was noted at her 6/18 annual with Rod Can, but pt states it has gotten larger since then. It is tender but she denies any erythema, nipple d/c. She sometimes does SBE but has been keeping an eye on it. She has a strong FH of breast/ovar cancer on her mat side and had neg BRCA testing 2014 with Dr. Cristino Martes. She denies any hx of breast masses in the past. She had a neg mammo 11/17 at [facility].  Past Medical History:  Diagnosis Date  . Anemia    iron deficiency anemia  . Anxiety   . 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 Arthritis   . Bipolar disorder ([place])   . BRCA negative 2014  . CAD (coronary artery disease)    a. cath 08/2003 nonobs dzs;  b. 11/2014: mLAD 85-90% s/p PCI/DES (Xience 3.25 x 23); c. cath 04/2015: 70% mD1 @ bifurcation. Patent LAD stent - > Med Rx.; d. Cath-PCI 5/17: Patent LAD stent, D1 now ~80% - PCI Resolute DES 2.25 x 12.    . Chronic systolic CHF (congestive heart failure) ([place])    a. echo 01/29/2013 EF 40-45%, basal anteroseptal & mid anteroseptal segments abnl; b echo 05/2012 EF 50-55%, mildly dilated LA, nl RVSP; c. 10/2015 Echo: EF 55-60%, mild conc LVH, no rwma.  . CKD (chronic kidney disease), stage IV ([place])   . Colon polyp   . CVA (cerebral vascular accident) ([place])    a. 06/2008-TEE nl LV fxn; b. 01/2013 - notes indicate short run of SVT at that time  . Depression   . Diabetes mellitus without complication ([place])   . Family history of breast cancer    BRCA neg 2014  . Gastric ulcer 04/27/2011  . GERD (gastroesophageal reflux disease)   . Headache    Migraines  . Hyperlipidemia   . Hypertension   . Hypertensive heart disease   . Hypothyroidism   . Ischemic cardiomyopathy    a. EF prev 40-45% in 2014-->Improved to  55-60% (10/2015).  . Malignant melanoma of skin of scalp ([place])   . MI, acute, non ST segment elevation ([place])   . Morbid obesity ([place])    a. s/p gastric bypass.  . Multiple sclerosis ([place]) 2015  . Myocardial infarction ([place]) 06/03/2015  . Neuromuscular disorder ([place])   . Neuropathy   . Orthostatic hypotension   . S/P drug eluting coronary stent placement 06/04/2015  . Syncope     Past Surgical History:  Procedure Laterality Date  . APPENDECTOMY    . CARDIAC CATHETERIZATION N/A 11/09/2014   Procedure: Coronary Angiography;  Surgeon: Minna Merritts, MD;  Location: [place] [facility] LAB;  Service: Cardiovascular;  Laterality: N/A;  . CARDIAC CATHETERIZATION N/A 11/12/2014   Procedure: Coronary Stent Intervention;  Surgeon: Isaias Cowman, MD;  Location: [place] [facility] LAB;  Service: Cardiovascular;  Laterality: N/A;  . CARDIAC CATHETERIZATION N/A 04/18/2015   Procedure: Left Heart Cath and Coronary Angiography;  Surgeon: Minna Merritts, MD;  Location: [place] [facility];  Service: Cardiovascular;  Laterality: N/A;  . CARDIAC CATHETERIZATION Left 06/04/2015   Procedure: Left Heart Cath and Coronary Angiography;  Surgeon: Wellington Hampshire, MD;  [facility]  INVASIVE CV LAB;  Service: Cardiovascular;  Laterality: Left;  . CARDIAC CATHETERIZATION N/A 06/04/2015   Procedure: Coronary Stent Intervention;  Surgeon: Wellington Hampshire, MD;  Location: [place] CV LAB;  Service: Cardiovascular;  Laterality: N/A;  . CESAREAN SECTION  2001  . COLONOSCOPY WITH PROPOFOL N/A 04/27/2016   Procedure: COLONOSCOPY WITH PROPOFOL;  Surgeon: [name], MD;  Location: [place];  Service: Endoscopy;  Laterality: N/A;  . CORONARY ANGIOPLASTY    . DILATION AND CURETTAGE OF UTERUS    . ESOPHAGOGASTRODUODENOSCOPY (EGD) WITH PROPOFOL N/A 09/14/2014   Procedure: ESOPHAGOGASTRODUODENOSCOPY (EGD) WITH PROPOFOL;  Surgeon: Josefine Class, MD;  Location: [facility] ENDOSCOPY;  Service: Endoscopy;   Laterality: N/A;  . ESOPHAGOGASTRODUODENOSCOPY (EGD) WITH PROPOFOL N/A 04/27/2016   Procedure: ESOPHAGOGASTRODUODENOSCOPY (EGD) WITH PROPOFOL;  Surgeon: [name], MD;  Location: [place];  Service: Endoscopy;  Laterality: N/A;  Diabetic - oral meds  . GASTRIC BYPASS  09/2009   [facility]   . Left Carotid to sublcavian artery bypass w/ subclavian artery ligation     a. Performed @ [place].  . MELANOMA EXCISION  2016   Dr. Evorn Gong  . [place]  2002  . RIGHT OOPHORECTOMY    . SHOULDER ARTHROSCOPY WITH OPEN ROTATOR CUFF REPAIR Right 01/07/2016   Procedure: SHOULDER ARTHROSCOPY WITH DEBRIDMENT, SUBACHROMIAL DECOMPRESSION;  Surgeon: Corky Mull, MD;  Location: [facility] ORS;  Service: Orthopedics;  Laterality: Right;  . TRIGGER FINGER RELEASE Right     [place]  . TUBAL LIGATION     R tube removed still w/ Left    Family History  Problem Relation Age of Onset  . Hypertension Mother   . Anxiety disorder Mother   . Depression Mother   . Bipolar disorder Mother   . Heart disease Mother   . Hyperlipidemia Mother   . Kidney disease Father   . Heart disease Father   . Hypertension Father   . Diabetes Father   . Stroke Father   . Colon cancer Father        dx in his 21's  . Anxiety disorder Father   . Depression Father   . Skin cancer Father   . Kidney disease Sister   . Thyroid nodules Sister   . Hypertension Sister   . Hypertension Sister   . Diabetes Sister   . Hyperlipidemia Sister   . Depression Sister   . Breast cancer Maternal Aunt 40  . Breast cancer Maternal Aunt 29  . Ovarian cancer Cousin   . Colon cancer Cousin   . Breast cancer Other   . Kidney cancer Neg Hx   . Bladder Cancer Neg Hx     No orders of the defined types were placed in this encounter.   ROS:  Review of Systems  Constitutional: Positive for malaise/fatigue. Negative for fever.  Gastrointestinal: Positive for nausea. Negative for constipation, diarrhea and  vomiting.  Psychiatric/Behavioral: Positive for depression. The patient does not have insomnia.    Breast ROS: positive for - new or changing breast lumps  Objective: BP 126/84   Ht '5\' 2"'  (1.575 m)   Wt 162 lb (73.5 kg)   BMI 29.63 kg/m    Physical Exam  Constitutional: She is oriented to person, place, and time. She appears well-developed.  Pulmonary/Chest: Right breast exhibits mass and tenderness. Right breast exhibits no inverted nipple, no nipple discharge and no skin change. Left breast exhibits mass. Left breast exhibits no inverted nipple, no nipple  discharge, no skin change and no tenderness. There is no breast swelling.    Abdominal: Soft.  Neurological: She is alert and oriented to person, place, and time. No cranial nerve deficit.  Skin: Skin is warm and dry.  Psychiatric: She has a normal mood and affect. Her behavior is normal. Thought content normal.  Vitals reviewed.     Assessment/Plan:  Breast mass, left - 1:00 position with 1x1 cm firm, mobile area. Check dx mammo and u/s. - Plan: MM DIAG BREAST TOMO BILATERAL, US BREAST LTD UNI LEFT INC AXILLA  Breast mass, right - 10:00 position with 2x2 cm firm, somewhat mobile, tender mass. Check dx mammo and u/s. - Plan: MM DIAG BREAST TOMO BILATERAL, US BREAST LTD UNI RIGHT INC AXILLA  Will call pt with results and f/u.    F/U  Return if symptoms worsen or fail to improve.  Glendoris Nodarse B. Jivan Symanski, PA-C 09/14/2016 11:43 AM

## 2016-09-15 DIAGNOSIS — E559 Vitamin D deficiency, unspecified: Secondary | ICD-10-CM | POA: Diagnosis not present

## 2016-09-15 DIAGNOSIS — E1122 Type 2 diabetes mellitus with diabetic chronic kidney disease: Secondary | ICD-10-CM | POA: Diagnosis not present

## 2016-09-15 DIAGNOSIS — I10 Essential (primary) hypertension: Secondary | ICD-10-CM | POA: Diagnosis not present

## 2016-09-15 DIAGNOSIS — N183 Chronic kidney disease, stage 3 (moderate): Secondary | ICD-10-CM | POA: Diagnosis not present

## 2016-09-16 ENCOUNTER — Telehealth: Payer: Self-pay | Admitting: Family Medicine

## 2016-09-16 ENCOUNTER — Other Ambulatory Visit: Payer: Self-pay | Admitting: Family Medicine

## 2016-09-16 DIAGNOSIS — G4700 Insomnia, unspecified: Secondary | ICD-10-CM

## 2016-09-16 NOTE — Telephone Encounter (Signed)
[facility] call the request a new Rx for eszopiclone (LUNESTA) 2 MG TABS tablet  due to this is not cover by insurance and pt will need a sleep aid.  The pharmacy is requesting an alternative.  [facility].  CB#[phone]

## 2016-09-16 NOTE — Telephone Encounter (Signed)
Please call in [place]

## 2016-09-16 NOTE — Telephone Encounter (Signed)
Please advise 

## 2016-09-16 NOTE — Telephone Encounter (Signed)
[facility] called in at [facility]  

## 2016-09-17 MED ORDER — ZOLPIDEM TARTRATE 5 MG PO TABS: 5.0000 mg | ORAL_TABLET | Freq: Every evening | ORAL | 3 refills | Status: DC | PRN

## 2016-09-17 NOTE — Telephone Encounter (Signed)
Please call in zolpidem  

## 2016-09-17 NOTE — Telephone Encounter (Signed)
Called in Rx as below.  

## 2016-09-17 NOTE — Telephone Encounter (Signed)
Patient said she is fine with going back on Ambien.

## 2016-09-17 NOTE — Telephone Encounter (Signed)
Please see if the patient would like to go back to Ambien (zolpidem 5mg  qhs) there are not any other generic sleep medications to try.

## 2016-09-23 ENCOUNTER — Ambulatory Visit
Admission: RE | Admit: 2016-09-23 | Discharge: 2016-09-23 | Disposition: A | Discharge status: Home / Self Care | Payer: Medicare Other | Source: Ambulatory Visit | Attending: Obstetrics and Gynecology | Admitting: Obstetrics and Gynecology

## 2016-09-23 DIAGNOSIS — N6311 Unspecified lump in the right breast, upper outer quadrant: Secondary | ICD-10-CM | POA: Insufficient documentation

## 2016-09-23 DIAGNOSIS — N6320 Unspecified lump in the left breast, unspecified quadrant: Secondary | ICD-10-CM

## 2016-09-23 DIAGNOSIS — R922 Inconclusive mammogram: Secondary | ICD-10-CM | POA: Diagnosis not present

## 2016-09-23 DIAGNOSIS — N6310 Unspecified lump in the right breast, unspecified quadrant: Secondary | ICD-10-CM

## 2016-09-23 DIAGNOSIS — N6489 Other specified disorders of breast: Secondary | ICD-10-CM | POA: Diagnosis not present

## 2016-09-23 DIAGNOSIS — N6321 Unspecified lump in the left breast, upper outer quadrant: Secondary | ICD-10-CM | POA: Diagnosis not present

## 2016-09-24 NOTE — Progress Notes (Signed)
Cardiology Office Note  Date:  09/25/2016   ID:  [address], [place] 1970-10-21, MRN [id number]  PCP:  [name], MD   Chief Complaint  Patient presents with  . other    6 month follow up. Meds reviewed by the pt. verbally. Pt. c/o fatigue and fluttering in chest at times.     HPI:  46 year old woman with a long hx of  smoking,  strokes,  2010 chest pain dating back to 2005  cardiac catheterization on August 09, 2003 found to have mild plaque in the LAD, no obstructive disease noted.      normal LV function,   s/p gastric bypass surgery,  Baseline creatinine 1.5, previously taken off her diuretic Catheterization May 2017 with stent placed to her first diagonal, there was residual 50% distal LAD disease, 30% proximal, 40% proximal circumflex Who presents for routine followup of her coronary artery disease  In follow-up today she reports having episodes of tachycardia Taking metoprolol succinate 25 mg daily Occasionally taking extra dose for breakthrough palpitations  Previous 30 day monitor reviewed with her showing sinus tachycardia episodes Lots of stress in her life, going through separation from her husband Sometimes Xanax helps her symptoms  Previous Gi issues, trouble swallowing She's had symptoms for several years, perhaps worse recently Will eat only once a day Hx of gastric bypass  EKG on today's visit shows normal sinus rhythm with rate 52 bpm, nonspecific ST and T wave abnormality anterolateral leads, inferior leads  There past medical history reviewed Cardiac catheterization   results reviewed with her from  March 2017showing moderate LAD disease, 70% diagonal disease small vessel, near the ostium,  Decision was made to treat this medically   other past medical history reviewed Recent carotid ultrasound reviewed with her showing minimal bilateral carotid disease Recent echocardiogram reviewed with her showing essentially normal ejection fraction  She  is status post left carotid to subclavian artery bypass with subclavian artery ligation, done at [facility] by vascular surgery admission to [facility] from 10/5 to 11/13/14 with unstable angina and was found to have severe mid LAD lesion at 85-90% stenosis on diagnositc cardiac cath s/p PCI/DES on staged procedure.   CT in October 2015 showing Right aortic arch with a large retro- esophageal vascular segment, persistent dorsal aortic segment) giving off aberrant left subclavian artery. This large caliber retroaortic segment measuring 18 mm compared to the ascending aorta which measures 22 mm. This large retroesophageal aortic segment along with the partially calcified atretic ductus ligament between the left aberrant subclavian artery and the left pulmonary artery creates an ALMOSTcomplete vascular ring. This is likelythe cause ofpatient's dysphagia [place].  Previously was taking midodrine or Florinef for hypotension following her stroke.  She previously presented to the hospital December 18, 2010 with chest pain.   stress test in the hospital showed no ischemia. Cardiac enzymes were negative. No EKG changes noted.   admission to the hospital 11/01/2012 with discharge 11/02/2012 with chest pain. She ruled out with negative cardiac enzymes. Blood pressures were in the 562-130 systolic range. Discharge blood pressure medications included amlodipine 10 mg daily, benazepril 40 mg daily, clonidine 0.2 mg., Coreg 3.125 mg twice a day  stroke and admission to the hospital at the end of December 2014. She had acute onset left sided weakness including arm and leg. She reports that review of MRI showed acute stroke, as well as previous strokes. She was seen by neurology and aspirin changed to Plavix 75 mg  daily. Hemoglobin A1c 8.5. Notes indicate short run of SVT while she was in the hospital and on the monitor.  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[place]); Arthritis; Bipolar disorder ([place]); BRCA negative (2014); CAD (coronary artery disease); Chronic systolic CHF (congestive heart failure) ([place]); CKD (chronic kidney disease), stage IV ([place]); Colon polyp; CVA (cerebral vascular accident) ([place]); Depression; Diabetes mellitus without complication ([place]); Family history of breast cancer; Gastric ulcer (04/27/2011); GERD (gastroesophageal reflux disease); Headache; Hyperlipidemia; Hypertension; Hypertensive heart disease; Hypothyroidism; Ischemic cardiomyopathy; Malignant melanoma of skin of scalp ([place]); MI, acute, non ST segment elevation ([place]); Morbid obesity ([place]); Multiple sclerosis ([place]) (2015); Myocardial infarction ([place]) (06/03/2015); Neuromuscular disorder ([place]); Neuropathy; Orthostatic hypotension; S/P drug eluting coronary stent placement (06/04/2015); and Syncope.  PSH:    Past Surgical History:  Procedure Laterality Date  . APPENDECTOMY    . CARDIAC CATHETERIZATION N/A 11/09/2014   Procedure: Coronary Angiography;  Surgeon: Minna Merritts, MD;  Location: [place] [facility];  Service: Cardiovascular;  Laterality: N/A;  . CARDIAC CATHETERIZATION N/A 11/12/2014   Procedure: Coronary Stent Intervention;  Surgeon: Isaias Cowman, MD;  Location: [place] [facility];  Service: Cardiovascular;  Laterality: N/A;  . CARDIAC CATHETERIZATION N/A 04/18/2015   Procedure: Left Heart Cath and Coronary Angiography;  Surgeon: Minna Merritts, MD;  Location: [place] [facility];   Service: Cardiovascular;  Laterality: N/A;  . CARDIAC CATHETERIZATION Left 06/04/2015   Procedure: Left Heart Cath and Coronary Angiography;  Surgeon: Wellington Hampshire, MD;  Location: [place] [facility];  Service: Cardiovascular;  Laterality: Left;  . CARDIAC CATHETERIZATION N/A 06/04/2015   Procedure: Coronary Stent Intervention;  Surgeon: Wellington Hampshire, MD;  Location: [place] [facility];  Service: Cardiovascular;  Laterality: N/A;  . CESAREAN SECTION  2001  . COLONOSCOPY WITH PROPOFOL N/A 04/27/2016   Procedure: COLONOSCOPY WITH PROPOFOL;  Surgeon: [name], MD;  Location: [place];  Service: Endoscopy;  Laterality: N/A;  . CORONARY ANGIOPLASTY    . DILATION AND CURETTAGE OF UTERUS    . ESOPHAGOGASTRODUODENOSCOPY (EGD) WITH PROPOFOL N/A 09/14/2014   Procedure: ESOPHAGOGASTRODUODENOSCOPY (EGD) WITH PROPOFOL;  Surgeon: Josefine Class, MD;  Location: [facility];  Service: Endoscopy;  Laterality: N/A;  . ESOPHAGOGASTRODUODENOSCOPY (EGD) WITH PROPOFOL N/A 04/27/2016   Procedure: ESOPHAGOGASTRODUODENOSCOPY (EGD) WITH PROPOFOL;  Surgeon: [name], MD;  Location: [place];  Service: Endoscopy;  Laterality: N/A;  Diabetic - oral meds  . GASTRIC BYPASS  09/2009   [facility]   . Left Carotid to sublcavian artery bypass w/ subclavian artery ligation     a. Performed @ [facility].  . MELANOMA EXCISION  2016   Dr. Evorn Gong  . [place]  2002  . RIGHT OOPHORECTOMY    . SHOULDER ARTHROSCOPY WITH OPEN ROTATOR CUFF REPAIR Right 01/07/2016   Procedure: SHOULDER ARTHROSCOPY WITH DEBRIDMENT, SUBACHROMIAL DECOMPRESSION;  Surgeon: Corky Mull, MD;  Location: [facility];  Service: Orthopedics;  Laterality: Right;  . TRIGGER FINGER RELEASE Right     [place]  . TUBAL LIGATION  R tube removed still w/ Left    Current Outpatient Prescriptions  Medication Sig Dispense Refill  . ALPRAZolam (XANAX) 0.5 MG tablet TAKE 1/2 TO 1 TABLET BY MOUTH EVERY 6  HOURS AS NEEDED FOR ANXIETY OR SLEEP 60 tablet 2  . Ascorbic Acid (VITAMIN C PO) Take by mouth daily.    Marland Kitchen aspirin EC 81 MG tablet Take 1 tablet (81 mg total) by mouth daily.    . Biotin w/ Vitamins C & E (HAIR SKIN & NAILS GUMMIES PO) Take by mouth daily.    . citalopram (CELEXA) 40 MG tablet TAKE ONE TABLET BY MOUTH ONCE DAILY 30 tablet 5  . clopidogrel (PLAVIX) 75 MG tablet Take 1 tablet (75 mg total) by mouth daily. 30 tablet 11  . diltiazem (CARDIZEM CD) 120 MG 24 hr capsule Take 1 capsule (120 mg total) by mouth daily. 90 capsule 3  . ezetimibe (ZETIA) 10 MG tablet Take 1 tablet (10 mg total) by mouth daily. 30 tablet 5  . gabapentin (NEURONTIN) 300 MG capsule Take 300 mg by mouth at bedtime. 600 mg in the morning    . isosorbide mononitrate (IMDUR) 60 MG 24 hr tablet Take 1 tablet (60 mg total) by mouth daily. 30 tablet 6  . levothyroxine (SYNTHROID, LEVOTHROID) 25 MCG tablet Take 1 tablet (25 mcg total) by mouth daily before breakfast. 30 tablet 12  . losartan (COZAAR) 50 MG tablet Take 1 tablet (50 mg total) by mouth daily. (Patient taking differently: Take 100 mg by mouth daily. ) 30 tablet 12  . metFORMIN (GLUCOPHAGE) 1000 MG tablet TAKE ONE TABLET BY MOUTH TWICE DAILY 60 tablet 12  . metoprolol succinate (TOPROL-XL) 25 MG 24 hr tablet Take 1 tablet (25 mg total) by mouth daily. Take with or immediately following a meal. 30 tablet 11  . nitroGLYCERIN (NITROSTAT) 0.4 MG SL tablet Place 1 tablet (0.4 mg total) under the tongue every 5 (five) minutes as needed for chest pain. 25 tablet 6  . OVER THE COUNTER MEDICATION Take 2 tablets by mouth daily. B-12 gummies    . pantoprazole (PROTONIX) 40 MG tablet Take 1 tablet (40 mg total) by mouth 2 (two) times daily. 60 tablet 5  . promethazine (PHENERGAN) 25 MG tablet Take 1 tablet (25 mg total) by mouth every 6 (six) hours as needed for nausea or vomiting. Reported on 05/31/2015 30 tablet 3  . rosuvastatin (CRESTOR) 40 MG tablet Take 1 tablet (40  mg total) by mouth daily at 6 PM. 90 tablet 3  . traZODone (DESYREL) 100 MG tablet Take 2 tablets (200 mg total) by mouth at bedtime. 30 tablet 6  . zolpidem (AMBIEN) 5 MG tablet Take 1-2 tablets (5-10 mg total) by mouth at bedtime as needed for sleep. 40 tablet 3   No current facility-administered medications for this visit.      Allergies:   Patient has no known allergies.   Social History:  The patient  reports that she quit smoking about 22 years ago. Her smoking use included Cigarettes. She has never used smokeless tobacco. She reports that she does not drink alcohol or use drugs.   Family History:   family history includes Anxiety disorder in her father and mother; Bipolar disorder in her mother; Breast cancer in her other; Breast cancer (age of onset: 67) in her maternal aunt and maternal aunt; Colon cancer in her cousin and father; Depression in her father, mother, and sister; Diabetes in her father and sister; Heart disease in  her father and mother; Hyperlipidemia in her mother and sister; Hypertension in her father, mother, sister, and sister; Kidney disease in her father and sister; Ovarian cancer in her cousin; Skin cancer in her father; Stroke in her father; Thyroid nodules in her sister.    Review of Systems: Review of Systems  Constitutional: Negative.   Respiratory: Negative.   Cardiovascular: Positive for palpitations.       Tachycardia  Gastrointestinal: Negative.   Musculoskeletal: Negative.   Neurological: Negative.   Psychiatric/Behavioral: Negative.   All other systems reviewed and are negative.   PHYSICAL EXAM: VS:  BP 140/80 (BP Location: Left Arm, Patient Position: Sitting, Cuff Size: Normal)   Pulse (!) 52   Ht '5\' 2"'  (1.575 m)   Wt 163 lb 8 oz (74.2 kg)   BMI 29.90 kg/m  , BMI Body mass index is 29.9 kg/m. GEN: Well nourished, well developed, in no acute distress  HEENT: normal  Neck: no JVD, carotid bruits, or masses Cardiac: RRR; no murmurs, rubs, or  gallops,no edema  Respiratory:  clear to auscultation bilaterally, normal work of breathing GI: soft, nontender, nondistended, + BS MS: no deformity or atrophy  Skin: warm and dry, no rash Neuro:  Strength and sensation are intact Psych: euthymic mood, full affect    Recent Labs: 12/13/2015: ALT 16 12/14/2015: TSH 3.168 01/29/2016: BUN 22; Creatinine, Ser 1.49; Magnesium 1.7; Potassium 4.2; Sodium 146 07/31/2016: Hemoglobin 12.4; Platelets 192    Lipid Panel Lab Results  Component Value Date   CHOL 159 05/28/2016   HDL 34 (L) 05/28/2016   LDLCALC 95 05/28/2016   TRIG 148 05/28/2016      Wt Readings from Last 3 Encounters:  09/25/16 163 lb 8 oz (74.2 kg)  09/14/16 162 lb (73.5 kg)  07/31/16 163 lb (73.9 kg)       ASSESSMENT AND PLAN:   Coronary artery disease involving native coronary artery of native heart with unstable angina pectoris ([place]) - Plan: EKG 12-Lead  Prior stent May 2017 Currently with no symptoms of angina. No further workup at this time. Continue current medication regimen.  Pure hypercholesterolemia Recommended she stay on her Crestor 40 mg daily and Zetia Goal LDL less than 70  Uncontrolled type 2 diabetes mellitus with other circulatory complication, without long-term current use of insulin ([place]) We have encouraged continued exercise, careful diet management in an effort to lose weight. She reports that she has been losing weight recently  Essential hypertension Blood pressure is well controlled on today's visit.  For her episodes of tachycardia we will add metoprolol succinate 25 mg daily  Cerebrovascular accident (CVA), unspecified mechanism ([place])  Chronic kidney disease (CKD), stage III (moderate) Followed by nephrology, creatinine down to 1.5  avoid Lasix and NSAIDs  Sinus tachycardia Prior event monitor showing sinus tachycardia Previously was on Coreg in the past. This was held in the hospital for unclear reasons  She is on  metoprolol succinate 25 daily She is requesting to go up to 50 daily as this seems to help her symptoms of tachycardia Recommended she go up to the higher dose but watch her heart rate. She has bradycardia on today but is relatively asymptomatic  Disposition:   F/U  12 months   Total encounter time more than 25 minutes  Greater than 50% was spent in counseling and coordination of care with the patient    No orders of the defined types were placed in this encounter.    Signed, Esmond Plants, M.D.,  Ph.D. 09/25/2016  [place], [place]

## 2016-09-25 ENCOUNTER — Ambulatory Visit (INDEPENDENT_AMBULATORY_CARE_PROVIDER_SITE_OTHER): Payer: Medicare Other | Admitting: Cardiovascular Disease

## 2016-09-25 ENCOUNTER — Encounter: Payer: Self-pay | Admitting: Cardiovascular Disease

## 2016-09-25 VITALS — BP 140/80 | HR 52 | Ht 62.0 in | Wt 163.5 lb

## 2016-09-25 DIAGNOSIS — E1159 Type 2 diabetes mellitus with other circulatory complications: Secondary | ICD-10-CM | POA: Diagnosis not present

## 2016-09-25 DIAGNOSIS — N183 Chronic kidney disease, stage 3 unspecified: Secondary | ICD-10-CM

## 2016-09-25 DIAGNOSIS — E118 Type 2 diabetes mellitus with unspecified complications: Secondary | ICD-10-CM | POA: Diagnosis not present

## 2016-09-25 DIAGNOSIS — I25111 Atherosclerotic heart disease of native coronary artery with angina pectoris with documented spasm: Secondary | ICD-10-CM

## 2016-09-25 DIAGNOSIS — G35 Multiple sclerosis: Secondary | ICD-10-CM

## 2016-09-25 DIAGNOSIS — I119 Hypertensive heart disease without heart failure: Secondary | ICD-10-CM | POA: Diagnosis not present

## 2016-09-25 MED ORDER — METOPROLOL SUCCINATE ER 25 MG PO TB24: 25.0000 mg | ORAL_TABLET | Freq: Two times a day (BID) | ORAL | 9 refills | Status: DC

## 2016-09-25 NOTE — Patient Instructions (Signed)
Medication Instructions:   Ok to increase metoprolol up to 2  a day  Labwork:  No new labs needed  Testing/Procedures:  No further testing at this time  Follow-Up: It was a pleasure seeing you in the office today. Please call us if you have new issues that need to be addressed before your next appt.  [phone]  Your physician wants you to follow-up in: 12 months.  You will receive a reminder letter in the mail two months in advance. If you don't receive a letter, please call our office to schedule the follow-up appointment.  If you need a refill on your cardiac medications before your next appointment, please call your pharmacy.

## 2016-10-01 ENCOUNTER — Other Ambulatory Visit: Payer: Self-pay | Admitting: Family Medicine

## 2016-10-01 DIAGNOSIS — F439 Reaction to severe stress, unspecified: Secondary | ICD-10-CM

## 2016-10-01 DIAGNOSIS — F5102 Adjustment insomnia: Secondary | ICD-10-CM

## 2016-10-01 NOTE — Telephone Encounter (Signed)
Please call in alprazolam.  

## 2016-10-01 NOTE — Telephone Encounter (Signed)
Called in Rx as below.  

## 2016-10-14 ENCOUNTER — Ambulatory Visit (INDEPENDENT_AMBULATORY_CARE_PROVIDER_SITE_OTHER): Payer: Medicare Other | Admitting: Family Medicine

## 2016-10-14 ENCOUNTER — Encounter: Payer: Self-pay | Admitting: Family Medicine

## 2016-10-14 VITALS — BP 142/82 | HR 69 | Temp 98.7°F | Resp 16 | Wt 162.0 lb

## 2016-10-14 DIAGNOSIS — E1159 Type 2 diabetes mellitus with other circulatory complications: Secondary | ICD-10-CM | POA: Diagnosis not present

## 2016-10-14 DIAGNOSIS — N390 Urinary tract infection, site not specified: Secondary | ICD-10-CM | POA: Diagnosis not present

## 2016-10-14 DIAGNOSIS — Z23 Encounter for immunization: Secondary | ICD-10-CM | POA: Diagnosis not present

## 2016-10-14 DIAGNOSIS — R112 Nausea with vomiting, unspecified: Secondary | ICD-10-CM | POA: Diagnosis not present

## 2016-10-14 DIAGNOSIS — I25111 Atherosclerotic heart disease of native coronary artery with angina pectoris with documented spasm: Secondary | ICD-10-CM

## 2016-10-14 DIAGNOSIS — IMO0002 Reserved for concepts with insufficient information to code with codable children: Secondary | ICD-10-CM

## 2016-10-14 DIAGNOSIS — E1165 Type 2 diabetes mellitus with hyperglycemia: Secondary | ICD-10-CM | POA: Diagnosis not present

## 2016-10-14 LAB — POCT UA - MICROALBUMIN: Microalbumin Ur, POC: 20 mg/L

## 2016-10-14 LAB — POCT URINALYSIS DIPSTICK
BILIRUBIN UA: NEGATIVE
GLUCOSE UA: NEGATIVE
Ketones, UA: NEGATIVE
NITRITE UA: NEGATIVE
Protein, UA: NEGATIVE
Spec Grav, UA: 1.01 (ref 1.010–1.025)
Urobilinogen, UA: 0.2 E.U./dL
pH, UA: 6 (ref 5.0–8.0)

## 2016-10-14 MED ORDER — PROMETHAZINE HCL 25 MG PO TABS: 25.0000 mg | ORAL_TABLET | Freq: Four times a day (QID) | ORAL | 3 refills | Status: DC | PRN

## 2016-10-14 NOTE — Progress Notes (Signed)
Patient: [name] Female    DOB: 1970/06/12   46 y.o.   MRN: [id number] Visit Date: 10/14/2016  Today's Provider: [name], MD   Chief Complaint  Patient presents with  . Nausea   Subjective:    HPI Nausea:  Patient comes in reporting that in the past 2 weeks she has had 3 episode of where she is nauseous to the point where she is unable to keep any food or liquids down. She has had some cold chills, sweats, abdominal cramping and vomiting. Patient states she has been under more stress lately due to a recent divorce.  No bowel changes.     No Known Allergies   Current Outpatient Prescriptions:  .  ALPRAZolam (XANAX) 0.5 MG tablet, TAKE 1/2 TO 1 TABLET BY MOUTH EVERY 6 HOURS AS NEEDED, Disp: 60 tablet, Rfl: 5 .  Ascorbic Acid (VITAMIN C PO), Take by mouth daily., Disp: , Rfl:  .  aspirin EC 81 MG tablet, Take 1 tablet (81 mg total) by mouth daily., Disp: , Rfl:  .  Biotin w/ Vitamins C & E (HAIR SKIN & NAILS GUMMIES PO), Take by mouth daily., Disp: , Rfl:  .  citalopram (CELEXA) 40 MG tablet, TAKE ONE TABLET BY MOUTH ONCE DAILY, Disp: 30 tablet, Rfl: 5 .  clopidogrel (PLAVIX) 75 MG tablet, Take 1 tablet (75 mg total) by mouth daily., Disp: 30 tablet, Rfl: 11 .  diltiazem (CARDIZEM CD) 120 MG 24 hr capsule, Take 1 capsule (120 mg total) by mouth daily., Disp: 90 capsule, Rfl: 3 .  ezetimibe (ZETIA) 10 MG tablet, Take 1 tablet (10 mg total) by mouth daily., Disp: 30 tablet, Rfl: 5 .  gabapentin (NEURONTIN) 300 MG capsule, Take 300 mg by mouth at bedtime. 600 mg in the morning, Disp: , Rfl:  .  isosorbide mononitrate (IMDUR) 60 MG 24 hr tablet, Take 1 tablet (60 mg total) by mouth daily., Disp: 30 tablet, Rfl: 6 .  levothyroxine (SYNTHROID, LEVOTHROID) 25 MCG tablet, Take 1 tablet (25 mcg total) by mouth daily before breakfast., Disp: 30 tablet, Rfl: 12 .  losartan (COZAAR) 50 MG tablet, Take 1 tablet (50 mg total) by mouth daily. (Patient taking differently: Take  100 mg by mouth daily. ), Disp: 30 tablet, Rfl: 12 .  metFORMIN (GLUCOPHAGE) 1000 MG tablet, TAKE ONE TABLET BY MOUTH TWICE DAILY, Disp: 60 tablet, Rfl: 12 .  metoprolol succinate (TOPROL-XL) 25 MG 24 hr tablet, Take 1 tablet (25 mg total) by mouth 2 (two) times daily. Take with or immediately following a meal., Disp: 60 tablet, Rfl: 9 .  nitroGLYCERIN (NITROSTAT) 0.4 MG SL tablet, Place 1 tablet (0.4 mg total) under the tongue every 5 (five) minutes as needed for chest pain., Disp: 25 tablet, Rfl: 6 .  OVER THE COUNTER MEDICATION, Take 2 tablets by mouth daily. B-12 gummies, Disp: , Rfl:  .  pantoprazole (PROTONIX) 40 MG tablet, Take 1 tablet (40 mg total) by mouth 2 (two) times daily., Disp: 60 tablet, Rfl: 5 .  promethazine (PHENERGAN) 25 MG tablet, Take 1 tablet (25 mg total) by mouth every 6 (six) hours as needed for nausea or vomiting. Reported on 05/31/2015, Disp: 30 tablet, Rfl: 3 .  rosuvastatin (CRESTOR) 40 MG tablet, Take 1 tablet (40 mg total) by mouth daily at 6 PM., Disp: 90 tablet, Rfl: 3 .  traZODone (DESYREL) 100 MG tablet, Take 2 tablets (200 mg total) by mouth at bedtime., Disp: 30  tablet, Rfl: 6 .  zolpidem (AMBIEN) 5 MG tablet, Take 1-2 tablets (5-10 mg total) by mouth at bedtime as needed for sleep., Disp: 40 tablet, Rfl: 3  Review of Systems  Constitutional: Positive for chills, diaphoresis and fatigue. Negative for appetite change and fever.  Respiratory: Negative for chest tightness and shortness of breath.   Cardiovascular: Negative for chest pain and palpitations.  Gastrointestinal: Positive for abdominal pain, diarrhea, nausea and vomiting.  Neurological: Negative for dizziness and weakness.    Social History  Substance Use Topics  . Smoking status: Former Smoker    Types: Cigarettes    Quit date: 08/31/1994  . Smokeless tobacco: Never Used     Comment: quit 28 years ago  . Alcohol use No   Objective:   BP (!) 142/82 (BP Location: Left Arm, Patient Position:  Sitting, Cuff Size: Normal)   Pulse 69   Temp 98.7 F (37.1 C) (Oral)   Resp 16   Wt 162 lb (73.5 kg)   SpO2 99% Comment: room air  BMI 29.63 kg/m  Vitals:   10/14/16 1528  BP: (!) 142/82  Pulse: 69  Resp: 16  Temp: 98.7 F (37.1 C)  TempSrc: Oral  SpO2: 99%  Weight: 162 lb (73.5 kg)     Physical Exam  General Appearance:    Alert, cooperative, no distress  Eyes:    PERRL, conjunctiva/corneas clear, EOM's intact       Lungs:     Clear to auscultation bilaterally, respirations unlabored  Heart:    Regular rate and rhythm  Abdomen:   bowel sounds present and normal in all 4 quadrants, soft or round. No CVA tenderness. Mild epigastric and RUQ tenderness. No masses     Results for orders placed or performed in visit on 10/14/16  POCT Urinalysis Dipstick  Result Value Ref Range   Color, UA yellow    Clarity, UA cloudy    Glucose, UA negative    Bilirubin, UA negative    Ketones, UA negative    Spec Grav, UA 1.010 1.010 - 1.025   Blood, UA Trace (Non hemolyzed)    pH, UA 6.0 5.0 - 8.0   Protein, UA negative    Urobilinogen, UA 0.2 0.2 or 1.0 E.U./dL   Nitrite, UA negative    Leukocytes, UA Moderate (2+) (A) Negative  POCT UA - Microalbumin  Result Value Ref Range   Microalbumin Ur, POC 20 mg/L   Creatinine, POC n/a mg/dL   Albumin/Creatinine Ratio, Urine, POC n/a        Assessment & Plan:     1. Nausea and vomiting, intractability of vomiting not specified, unspecified vomiting type  - POCT Urinalysis Dipstick - COMPLETE METABOLIC PANEL WITH GFR - CBC - Amylase - US Abdomen Complete; Future - promethazine (PHENERGAN) 25 MG tablet; Take 1 tablet (25 mg total) by mouth every 6 (six) hours as needed for nausea or vomiting. Reported on 05/31/2015  Dispense: 30 tablet; Refill: 3  2. Urinary tract infection without hematuria, site unspecified  - Urine Culture  3. Uncontrolled type 2 diabetes mellitus with other circulatory complication, without long-term  current use of insulin (HCC)  - POCT UA - Microalbumin  4. Need for influenza vaccination  - Flu Vaccine QUAD 6+ mos PF IM (Fluarix Quad PF)       [name], MD  [facility]

## 2016-10-15 ENCOUNTER — Telehealth: Payer: Self-pay | Admitting: Family Medicine

## 2016-10-15 LAB — COMPLETE METABOLIC PANEL WITH GFR
AG Ratio: 1.5 (calc) (ref 1.0–2.5)
ALT: 10 U/L (ref 6–29)
AST: 14 U/L (ref 10–35)
Albumin: 4.2 g/dL (ref 3.6–5.1)
Alkaline phosphatase (APISO): 124 U/L — ABNORMAL HIGH (ref 33–115)
BUN/Creatinine Ratio: 7 (calc) (ref 6–22)
BUN: 14 mg/dL (ref 7–25)
CALCIUM: 8.2 mg/dL — AB (ref 8.6–10.2)
CO2: 27 mmol/L (ref 20–32)
CREATININE: 1.92 mg/dL — AB (ref 0.50–1.10)
Chloride: 96 mmol/L — ABNORMAL LOW (ref 98–110)
GFR, EST NON AFRICAN AMERICAN: 30 mL/min/{1.73_m2} — AB (ref >=60)
GFR, Est African American: 35 mL/min/{1.73_m2} — ABNORMAL LOW (ref >=60)
GLOBULIN: 2.8 g/dL (ref 1.9–3.7)
Glucose, Bld: 103 mg/dL — ABNORMAL HIGH (ref 65–99)
Potassium: 3.4 mmol/L — ABNORMAL LOW (ref 3.5–5.3)
SODIUM: 136 mmol/L (ref 135–146)
Total Bilirubin: 0.4 mg/dL (ref 0.2–1.2)
Total Protein: 7 g/dL (ref 6.1–8.1)

## 2016-10-15 LAB — CBC
HCT: 38.4 % (ref 35.0–45.0)
HEMOGLOBIN: 12.7 g/dL (ref 11.7–15.5)
MCH: 27.4 pg (ref 27.0–33.0)
MCHC: 33.1 g/dL (ref 32.0–36.0)
MCV: 82.9 fL (ref 80.0–100.0)
MPV: 11.5 fL (ref 7.5–12.5)
Platelets: 245 10*3/uL (ref 140–400)
RBC: 4.63 10*6/uL (ref 3.80–5.10)
RDW: 12.1 % (ref 11.0–15.0)
WBC: 10.3 10*3/uL (ref 3.8–10.8)

## 2016-10-15 LAB — AMYLASE: Amylase: 53 U/L (ref 21–101)

## 2016-10-15 MED ORDER — CIPROFLOXACIN HCL 500 MG PO TABS: 500.0000 mg | ORAL_TABLET | Freq: Two times a day (BID) | ORAL | 0 refills | Status: AC

## 2016-10-15 NOTE — Telephone Encounter (Signed)
Please advise patient I want her to go ahead and start antibiotic for possible uti since we are closed tomorrow and will not be here when results of urine culture are complete. We will contact her on Monday when we reopen. Have sent prescription to [facility] in [facility]

## 2016-10-15 NOTE — Telephone Encounter (Signed)
Patient advised and verbally voiced understanding. Pharmacist Lexine Baton called stating there is a drug interaction between the [place] and [place]. Dr. Caryn Section was advised of this and he states that while on Cipro patient should cut back to 1/2 tablet of Tizanizine. Patient advised and verbally voiced understanding.

## 2016-10-15 NOTE — Telephone Encounter (Signed)
Nikki w/[facility] called to advise there is a drug interaction with the Rxciprofloxacin (CIPRO) 500 MG tablet  and the Rx Tizanizine 4MG , pt gets this from Dr Jennings Books.  SE#[phone]/VP

## 2016-10-15 NOTE — Telephone Encounter (Signed)
Dr. Caryn Section was advised of this and he states that while on Cipro patient should cut back to 1/2 tablet of Tizanizine. Nikki with [employer] was advised.  Patient was also advised and verbally voiced understanding.

## 2016-10-17 ENCOUNTER — Other Ambulatory Visit: Payer: Self-pay | Admitting: Family Medicine

## 2016-10-17 LAB — URINE CULTURE
MICRO NUMBER:: 81006970
SPECIMEN QUALITY:: ADEQUATE

## 2016-10-20 ENCOUNTER — Ambulatory Visit
Admission: RE | Admit: 2016-10-20 | Discharge: 2016-10-20 | Disposition: A | Discharge status: Home / Self Care | Payer: Medicare Other | Source: Ambulatory Visit | Attending: Family Medicine | Admitting: Family Medicine

## 2016-10-20 DIAGNOSIS — R112 Nausea with vomiting, unspecified: Secondary | ICD-10-CM | POA: Diagnosis not present

## 2016-10-20 DIAGNOSIS — R938 Abnormal findings on diagnostic imaging of other specified body structures: Secondary | ICD-10-CM | POA: Insufficient documentation

## 2016-10-20 DIAGNOSIS — N1330 Unspecified hydronephrosis: Secondary | ICD-10-CM | POA: Diagnosis not present

## 2016-10-20 IMAGING — CR DG CHEST 2V
1 series · 2 of 2 positions shown · non-contrast
Comparison: 06/03/2014

CLINICAL DATA: Chest pain since yesterday

EXAM:
CHEST  2 VIEW

[Series 1: w chest pa · 0.14mm/px · 2 of 2 slices shown]
[im 1/2]
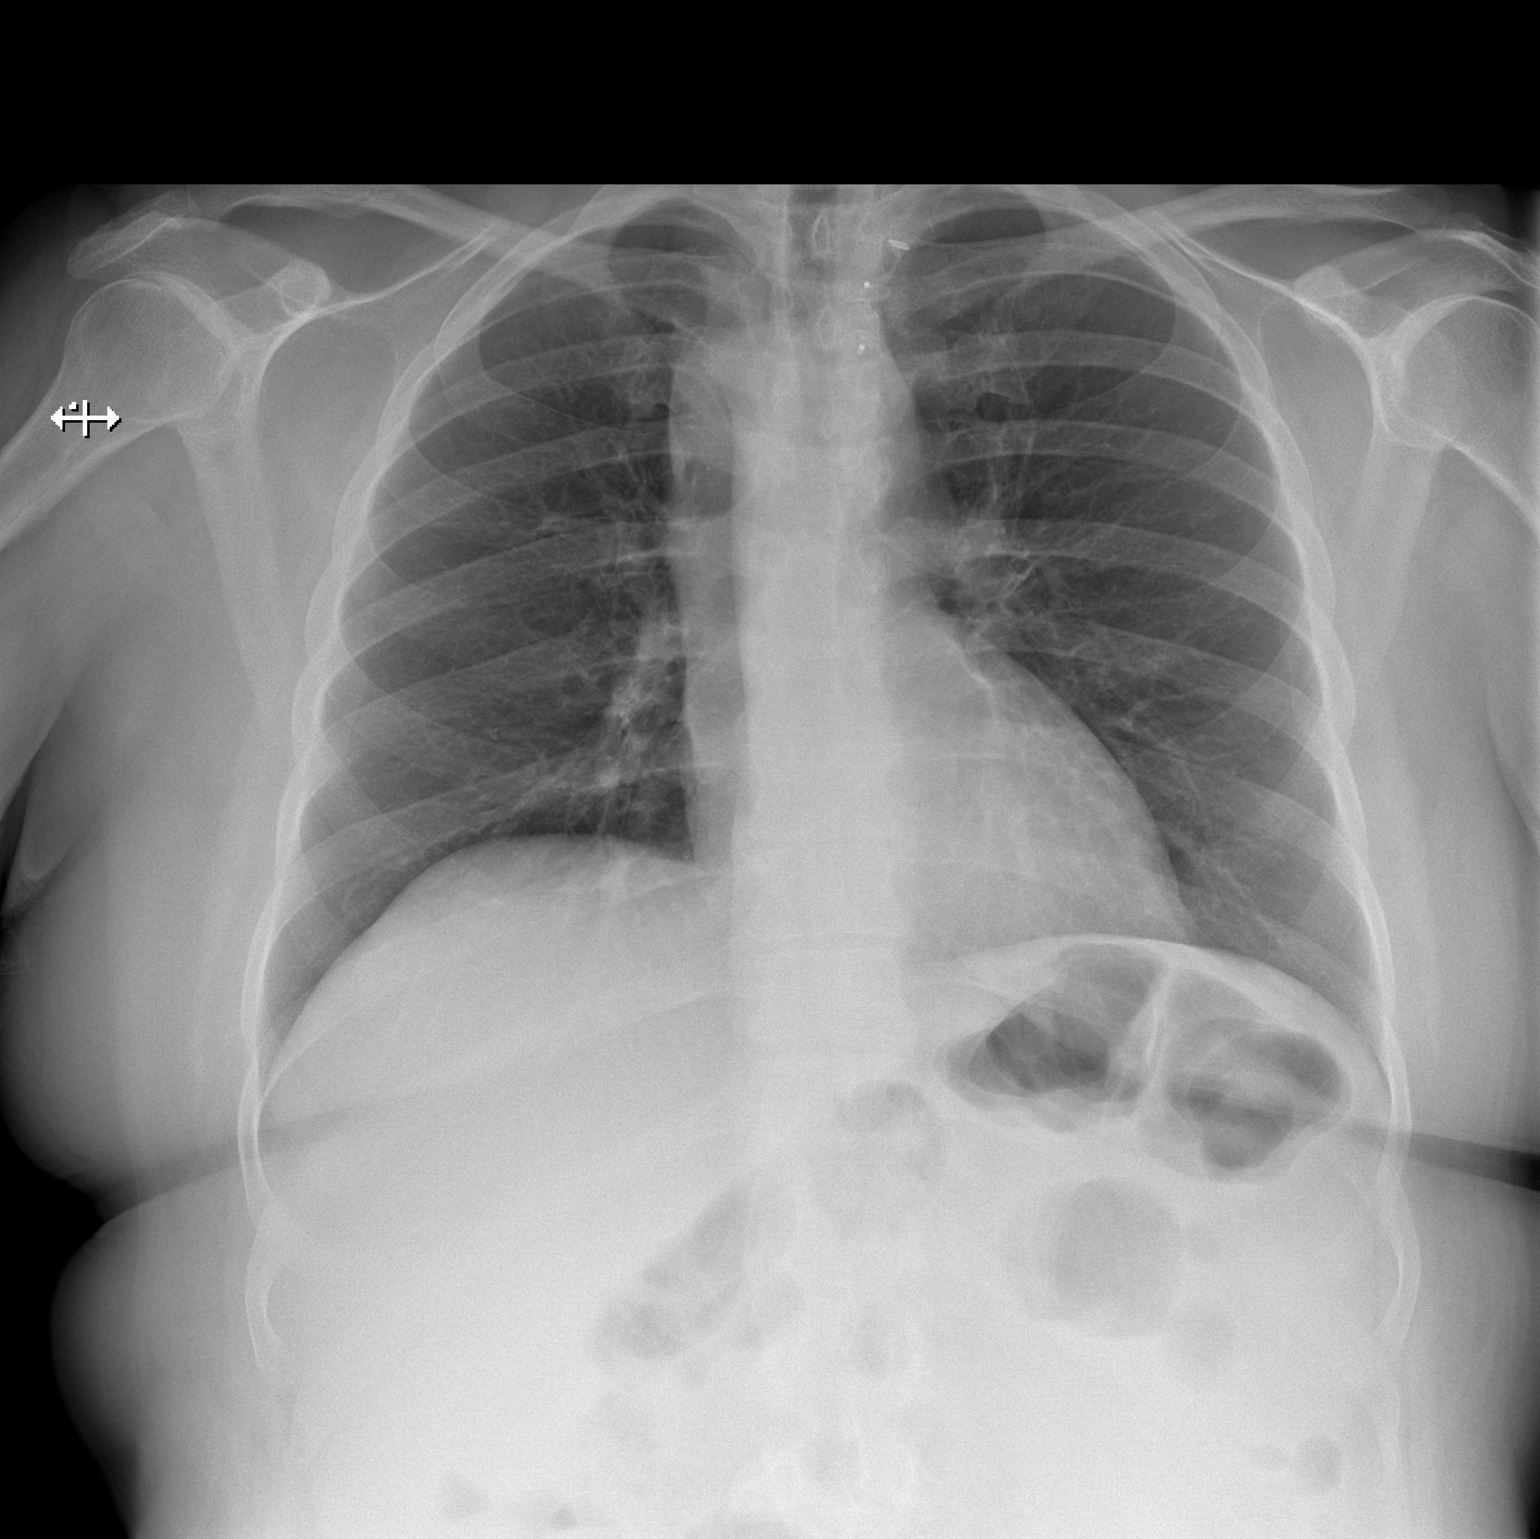
[im 2/2]
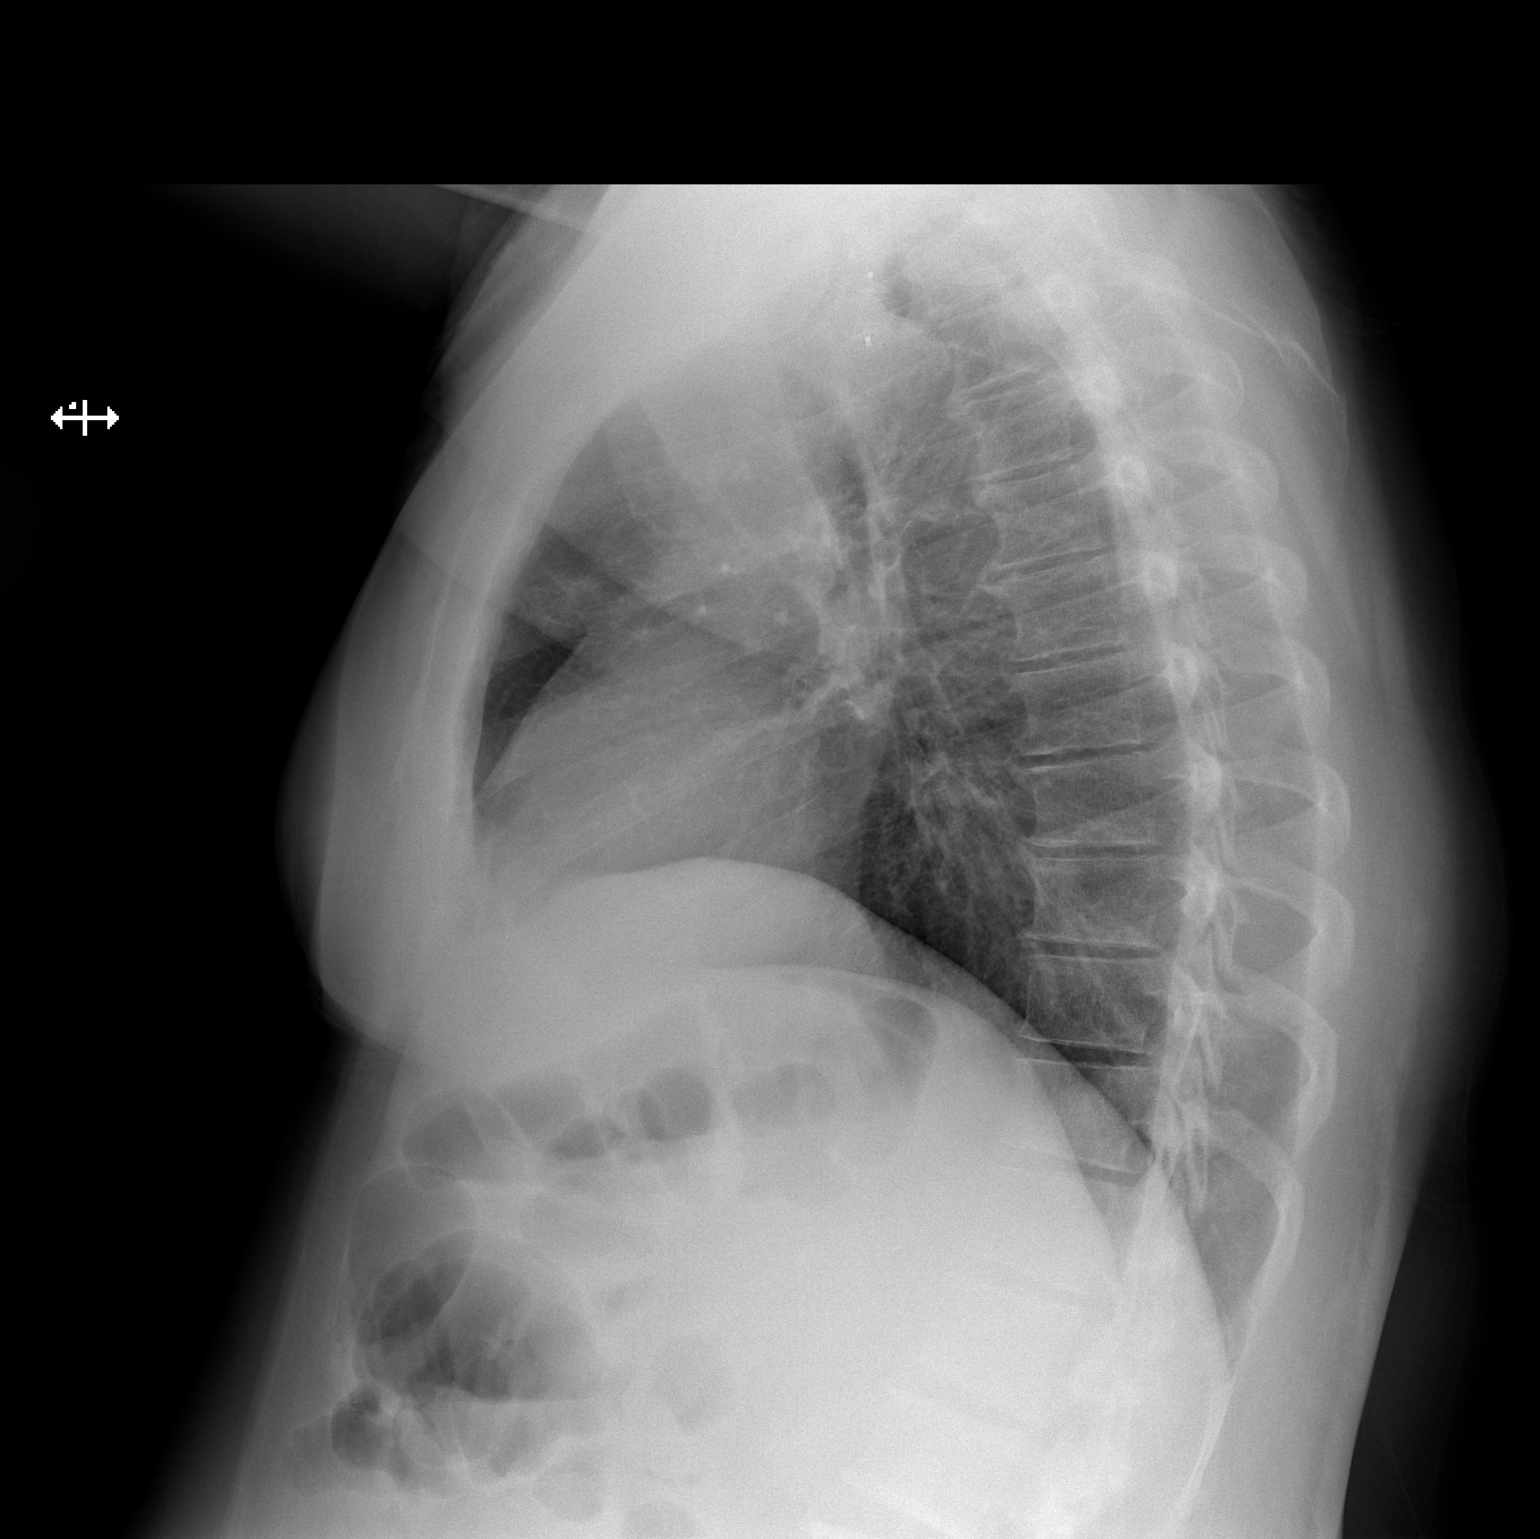

[2 of 2 positions shown; findings below may reference images not displayed]

FINDINGS: Normal heart size. Clear lungs. Azygos lobe is noted. Low volumes.
No pneumothorax. Postoperative changes in the left thoracic inlet.
IMPRESSION: No active cardiopulmonary disease.

## 2016-10-21 ENCOUNTER — Telehealth: Payer: Self-pay

## 2016-10-21 DIAGNOSIS — K828 Other specified diseases of gallbladder: Secondary | ICD-10-CM

## 2016-10-21 NOTE — Telephone Encounter (Signed)
-----   Message from [name], MD sent at 10/20/2016  1:00 PM EDT ----- Ultrasound shows gallbladder sludge which could be causing her symptoms, if not doing better, then she should see surgery to consider removing gall bladder.

## 2016-10-21 NOTE — Telephone Encounter (Signed)
Patient advised as below. Patient request referral to surgery for consult.

## 2016-10-26 ENCOUNTER — Ambulatory Visit (INDEPENDENT_AMBULATORY_CARE_PROVIDER_SITE_OTHER): Payer: Medicare Other | Admitting: General Surgery

## 2016-10-26 ENCOUNTER — Encounter: Payer: Self-pay | Admitting: General Surgery

## 2016-10-26 VITALS — BP 118/72 | HR 70 | Resp 12 | Ht 62.0 in | Wt 159.0 lb

## 2016-10-26 DIAGNOSIS — N183 Chronic kidney disease, stage 3 unspecified: Secondary | ICD-10-CM

## 2016-10-26 DIAGNOSIS — I2510 Atherosclerotic heart disease of native coronary artery without angina pectoris: Secondary | ICD-10-CM

## 2016-10-26 DIAGNOSIS — K8020 Calculus of gallbladder without cholecystitis without obstruction: Secondary | ICD-10-CM

## 2016-10-26 DIAGNOSIS — R1011 Right upper quadrant pain: Secondary | ICD-10-CM

## 2016-10-26 NOTE — Patient Instructions (Signed)
Laparoscopic Cholecystectomy °Laparoscopic cholecystectomy is surgery to remove the gallbladder. The gallbladder is a pear-shaped organ that lies beneath the liver on the right side of the body. The gallbladder stores bile, which is a fluid that helps the body to digest fats. Cholecystectomy is often done for inflammation of the gallbladder (cholecystitis). This condition is usually caused by a buildup of gallstones (cholelithiasis) in the gallbladder. Gallstones can block the flow of bile, which can result in inflammation and pain. In severe cases, emergency surgery may be required. °This procedure is done though small incisions in your abdomen (laparoscopic surgery). A thin scope with a camera (laparoscope) is inserted through one incision. Thin surgical instruments are inserted through the other incisions. In some cases, a laparoscopic procedure may be turned into a type of surgery that is done through a larger incision (open surgery). °Tell a health care provider about: °· Any allergies you have. °· All medicines you are taking, including vitamins, herbs, eye drops, creams, and over-the-counter medicines. °· Any problems you or family members have had with anesthetic medicines. °· Any blood disorders you have. °· Any surgeries you have had. °· Any medical conditions you have. °· Whether you are pregnant or may be pregnant. °What are the risks? °Generally, this is a safe procedure. However, problems may occur, including: °· Infection. °· Bleeding. °· Allergic reactions to medicines. °· Damage to other structures or organs. °· A stone remaining in the common bile duct. The common bile duct carries bile from the gallbladder into the small intestine. °· A bile leak from the cyst duct that is clipped when your gallbladder is removed. ° °What happens before the procedure? °Staying hydrated °Follow instructions from your health care provider about hydration, which may include: °· Up to 2 hours before the procedure -  you may continue to drink clear liquids, such as water, clear fruit juice, black coffee, and plain tea. ° °Eating and drinking restrictions °Follow instructions from your health care provider about eating and drinking, which may include: °· 8 hours before the procedure - stop eating heavy meals or foods such as meat, fried foods, or fatty foods. °· 6 hours before the procedure - stop eating light meals or foods, such as toast or cereal. °· 6 hours before the procedure - stop drinking milk or drinks that contain milk. °· 2 hours before the procedure - stop drinking clear liquids. ° °Medicines °· Ask your health care provider about: °? Changing or stopping your regular medicines. This is especially important if you are taking diabetes medicines or blood thinners. °? Taking medicines such as aspirin and ibuprofen. These medicines can thin your blood. Do not take these medicines before your procedure if your health care provider instructs you not to. °· You may be given antibiotic medicine to help prevent infection. °General instructions °· Let your health care provider know if you develop a cold or an infection before surgery. °· Plan to have someone take you home from the hospital or clinic. °· Ask your health care provider how your surgical site will be marked or identified. °What happens during the procedure? °· To reduce your risk of infection: °? Your health care team will wash or sanitize their hands. °? Your skin will be washed with soap. °? Hair may be removed from the surgical area. °· An IV tube may be inserted into one of your veins. °· You will be given one or more of the following: °? A medicine to help you relax (sedative). °?   A medicine to make you fall asleep (general anesthetic). °· A breathing tube will be placed in your mouth. °· Your surgeon will make several small cuts (incisions) in your abdomen. °· The laparoscope will be inserted through one of the small incisions. The camera on the laparoscope  will send images to a TV screen (monitor) in the operating room. This lets your surgeon see inside your abdomen. °· Air-like gas will be pumped into your abdomen. This will expand your abdomen to give the surgeon more room to perform the surgery. °· Other tools that are needed for the procedure will be inserted through the other incisions. The gallbladder will be removed through one of the incisions. °· Your common bile duct may be examined. If stones are found in the common bile duct, they may be removed. °· After your gallbladder has been removed, the incisions will be closed with stitches (sutures), staples, or skin glue. °· Your incisions may be covered with a bandage (dressing). °The procedure may vary among health care providers and hospitals. °What happens after the procedure? °· Your blood pressure, heart rate, breathing rate, and blood oxygen level will be monitored until the medicines you were given have worn off. °· You will be given medicines as needed to control your pain. °· Do not drive for 24 hours if you were given a sedative. °This information is not intended to replace advice given to you by your health care provider. Make sure you discuss any questions you have with your health care provider. °Document Released: 01/19/2005 Document Revised: 08/11/2015 Document Reviewed: 07/08/2015 °Elsevier Interactive Patient Education © 2018 [employer] Inc. ° °

## 2016-10-26 NOTE — Progress Notes (Signed)
Patient ID: [name], female   DOB: August 03, 1970, 46 y.o.   MRN: [id number]  Chief Complaint  Patient presents with  . Abdominal Pain    HPI [name] [address] [name] is a 46 y.o. female is here today for an evaluation of abdominal pain. Patient stated the pain started about one month ago. The pain is located near the right upper abdomen. Patient had an ultrasound done on 10/20/16. Patient states she has been vomiting and feeling nauseous all the time. She state she can only eat mashed potatoes. She has been having diarrhea. This has been going on for about 3 weeks. History gastric bypass surgery with 100 lb weight loss, gastric ulcers, GERD, DM, CAD with 2 stents placed, stage 3 CKD. Sees Dr. [name] as her cardiologist and Dr. Holley Raring as her nephrologist.  HPI  Past Medical History:  Diagnosis Date  . Anemia    iron deficiency anemia  . Anxiety   . Aortic arch aneurysm ([name])   . Arthritis   . Bipolar disorder ([name])   . BRCA negative 2014  . CAD (coronary artery disease)    a. cath 08/2003 nonobs dzs;  b. 11/2014: mLAD 85-90% s/p PCI/DES (Xience 3.25 x 23); c. cath 04/2015: 70% mD1 @ bifurcation. Patent LAD stent - > Med Rx.; d. Cath-PCI 5/17: Patent LAD stent, D1 now ~80% - PCI Resolute DES 2.25 x 12.    . Chronic systolic CHF (congestive heart failure) ([name])    a. echo 01/29/2013 EF 40-45%, basal anteroseptal & mid anteroseptal segments abnl; b echo 05/2012 EF 50-55%, mildly dilated LA, nl RVSP; c. 10/2015 Echo: EF 55-60%, mild conc LVH, no rwma.  . CKD (chronic kidney disease), stage IV ([name])   . Colon polyp   . CVA (cerebral vascular accident) ([place])    a. 06/2008-TEE nl LV fxn; b. 01/2013 - notes indicate short run of SVT at that time  . Depression   . Diabetes mellitus without complication ([place])   . Family history of breast cancer    BRCA neg 2014  . Gastric ulcer 04/27/2011  . GERD (gastroesophageal reflux disease)   . Headache    Migraines  . Hyperlipidemia   . Hypertension   .  Hypertensive heart disease   . Hypothyroidism   . Ischemic cardiomyopathy    a. EF prev 40-45% in 2014-->Improved to 55-60% (10/2015).  . Malignant melanoma of skin of scalp ([name])   . MI, acute, non ST segment elevation ([name])   . Morbid obesity ([name])    a. s/p gastric bypass.  . Multiple sclerosis ([name]) 2015  . Myocardial infarction ([name]) 06/03/2015  . Neuromuscular disorder ([name])   . Neuropathy   . Orthostatic hypotension   . S/P drug eluting coronary stent placement 06/04/2015  . Syncope     Past Surgical History:  Procedure Laterality Date  . APPENDECTOMY    . CARDIAC CATHETERIZATION N/A 11/09/2014   Procedure: Coronary Angiography;  Surgeon: Minna Merritts, MD;  Location: [facility];  Service: Cardiovascular;  Laterality: N/A;  . CARDIAC CATHETERIZATION N/A 11/12/2014   Procedure: Coronary Stent Intervention;  Surgeon: Isaias Cowman, MD;  Location: [facility];  Service: Cardiovascular;  Laterality: N/A;  . CARDIAC CATHETERIZATION N/A 04/18/2015   Procedure: Left Heart Cath and Coronary Angiography;  Surgeon: Minna Merritts, MD;  Location: [facility];  Service: Cardiovascular;  Laterality: N/A;  . CARDIAC CATHETERIZATION Left 06/04/2015   Procedure: Left Heart Cath and Coronary  Angiography;  Surgeon: Kendra Hampshire, MD;  Location: [facility];  Service: Cardiovascular;  Laterality: Left;  . CARDIAC CATHETERIZATION N/A 06/04/2015   Procedure: Coronary Stent Intervention;  Surgeon: Kendra Hampshire, MD;  Location: [facility];  Service: Cardiovascular;  Laterality: N/A;  . CESAREAN SECTION  2001  . COLONOSCOPY WITH PROPOFOL N/A 04/27/2016   Procedure: COLONOSCOPY WITH PROPOFOL;  Surgeon: Kendra Lame, MD;  Location: [facility];  Service: Endoscopy;  Laterality: N/A;  . CORONARY ANGIOPLASTY    . DILATION AND CURETTAGE OF UTERUS    . ESOPHAGOGASTRODUODENOSCOPY (EGD) WITH PROPOFOL N/A 09/14/2014   Procedure:  ESOPHAGOGASTRODUODENOSCOPY (EGD) WITH PROPOFOL;  Surgeon: Kendra Class, MD;  Location: [facility];  Service: Endoscopy;  Laterality: N/A;  . ESOPHAGOGASTRODUODENOSCOPY (EGD) WITH PROPOFOL N/A 04/27/2016   Procedure: ESOPHAGOGASTRODUODENOSCOPY (EGD) WITH PROPOFOL;  Surgeon: Kendra Lame, MD;  Location: [facility];  Service: Endoscopy;  Laterality: N/A;  Diabetic - oral meds  . GASTRIC BYPASS  09/2009   [facility]   . Left Carotid to sublcavian artery bypass w/ subclavian artery ligation     a. Performed @ [facility].  . MELANOMA EXCISION  2016   Dr. Evorn Gong  . [place]  2002  . RIGHT OOPHORECTOMY    . SHOULDER ARTHROSCOPY WITH OPEN ROTATOR CUFF REPAIR Right 01/07/2016   Procedure: SHOULDER ARTHROSCOPY WITH DEBRIDMENT, SUBACHROMIAL DECOMPRESSION;  Surgeon: Kendra Mull, MD;  Location: [facility];  Service: Orthopedics;  Laterality: Right;  . TRIGGER FINGER RELEASE Right     Middle Finger  . TUBAL LIGATION     R tube removed still w/ Left    Family History  Problem Relation Age of Onset  . Hypertension Mother   . Anxiety disorder Mother   . Depression Mother   . Bipolar disorder Mother   . Heart disease Mother   . Hyperlipidemia Mother   . Kidney disease Father   . Heart disease Father   . Hypertension Father   . Diabetes Father   . Stroke Father   . Colon cancer Father        dx in his 67's  . Anxiety disorder Father   . Depression Father   . Skin cancer Father   . Kidney disease Sister   . Thyroid nodules Sister   . Hypertension Sister   . Hypertension Sister   . Diabetes Sister   . Hyperlipidemia Sister   . Depression Sister   . Breast cancer Maternal Aunt 70  . Breast cancer Maternal Aunt 57  . Ovarian cancer Cousin   . Colon cancer Cousin   . Breast cancer Other   . Kidney cancer Neg Hx   . Bladder Cancer Neg Hx     Social History Social History  Substance Use Topics  . Smoking status: Former Smoker    Types: Cigarettes      Quit date: 08/31/1994  . Smokeless tobacco: Never Used     Comment: quit 28 years ago  . Alcohol use No    No Known Allergies  Current Outpatient Prescriptions  Medication Sig Dispense Refill  . ALPRAZolam (XANAX) 0.5 MG tablet TAKE 1/2 TO 1 TABLET BY MOUTH EVERY 6 HOURS AS NEEDED 60 tablet 5  . aspirin EC 81 MG tablet Take 1 tablet (81 mg total) by mouth daily.    . citalopram (CELEXA) 40 MG tablet TAKE ONE TABLET BY MOUTH ONCE DAILY 30 tablet 5  . clopidogrel (PLAVIX) 75 MG tablet  Take 1 tablet (75 mg total) by mouth daily. 30 tablet 11  . diltiazem (CARDIZEM CD) 120 MG 24 hr capsule Take 1 capsule (120 mg total) by mouth daily. 90 capsule 3  . gabapentin (NEURONTIN) 300 MG capsule TAKE ONE CAPSULE BY MOUTH THREE TIMES DAILY 90 capsule 12  . isosorbide mononitrate (IMDUR) 60 MG 24 hr tablet Take 1 tablet (60 mg total) by mouth daily. 30 tablet 6  . levothyroxine (SYNTHROID, LEVOTHROID) 25 MCG tablet Take 1 tablet (25 mcg total) by mouth daily before breakfast. 30 tablet 12  . losartan (COZAAR) 50 MG tablet Take 1 tablet (50 mg total) by mouth daily. (Patient taking differently: Take 100 mg by mouth daily. ) 30 tablet 12  . metFORMIN (GLUCOPHAGE) 1000 MG tablet TAKE ONE TABLET BY MOUTH TWICE DAILY 60 tablet 12  . metoprolol succinate (TOPROL-XL) 25 MG 24 hr tablet Take 1 tablet (25 mg total) by mouth 2 (two) times daily. Take with or immediately following a meal. 60 tablet 9  . nitroGLYCERIN (NITROSTAT) 0.4 MG SL tablet Place 1 tablet (0.4 mg total) under the tongue every 5 (five) minutes as needed for chest pain. 25 tablet 6  . OVER THE COUNTER MEDICATION Take 2 tablets by mouth daily. B-12 gummies    . pantoprazole (PROTONIX) 40 MG tablet Take 1 tablet (40 mg total) by mouth 2 (two) times daily. 60 tablet 5  . promethazine (PHENERGAN) 25 MG tablet Take 1 tablet (25 mg total) by mouth every 6 (six) hours as needed for nausea or vomiting. Reported on 05/31/2015 30 tablet 3  . traZODone  (DESYREL) 100 MG tablet Take 2 tablets (200 mg total) by mouth at bedtime. 30 tablet 6  . zolpidem (AMBIEN) 5 MG tablet Take 1-2 tablets (5-10 mg total) by mouth at bedtime as needed for sleep. 40 tablet 3   No current facility-administered medications for this visit.     Review of Systems Review of Systems  Constitutional: Negative.   Respiratory: Negative.   Cardiovascular: Negative.   Gastrointestinal: Positive for abdominal pain, diarrhea, nausea and vomiting.    Blood pressure 118/72, pulse 70, resp. rate 12, height _0  (1.575 m), weight 159 lb (72.1 kg).  Physical Exam Physical Exam  Constitutional: She is oriented to person, place, and time. She appears well-developed and well-nourished.  Eyes: Conjunctivae are normal. No scleral icterus.  Cardiovascular: Normal rate, regular rhythm and normal heart sounds.   Pulmonary/Chest: Effort normal and breath sounds normal. No respiratory distress.  Abdominal: Soft. Bowel sounds are normal.    Mild RUQ tenderness  Neurological: She is alert and oriented to person, place, and time.  Skin: Skin is warm and dry.    Data Reviewed Prior US, labs, and notes reviewed.  Assessment    Cholelithiasis - [place] on 10/20/2016 showed sludge and possible gallstone without wall thickening. CMP on 10/14/2016 showed elevated Alk phos 124, liver function otherwise normal. Amylase and CBC were normal.   CKD, stage 3 -  CMP on 10/14/16 shows Cr 1.92, GFR 30, BUN 14. Being followed by Dr. Holley Raring and sees him every 4 months, not on dialysis.  Coronary artery disease with stent placement 06/2015 - followed by Dr. [name], on Plavix.   DM- on oral agent    Plan    Discussed laparoscopic cholecystectomy with patient and she was agreeable. Will consult with Dr. [name] for cardiac clearance and Dr. Holley Raring, her nephrologist, prior to cholecystectomy.   Laparoscopic Cholecystectomy with Intraoperative Cholangiogram. The procedure,  including it's potential  risks and complications (including but not limited to infection, bleeding, injury to intra-abdominal organs or bile ducts, bile leak, poor cosmetic result, sepsis and death) were discussed with the patient in detail. Non-operative options, including their inherent risks (acute calculous cholecystitis with possible choledocholithiasis or gallstone pancreatitis, with the risk of ascending cholangitis, sepsis, and death) were discussed as well. The patient expressed and understanding of what we discussed and wishes to proceed with laparoscopic cholecystectomy. The patient further understands that if it is technically not possible, or it is unsafe to proceed laparoscopically, that I will convert to an open cholecystectomy.  HPI, Physical Exam, Assessment and Plan have been scribed under the direction and in the presence of Mckinley Jewel, MD.  Verlene Mayer, CMA   I have completed the exam and reviewed the above documentation for accuracy and completeness.  I agree with the above.  Haematologist has been used and any errors in dictation or transcription are unintentional.  Seeplaputhur G. Jamal Collin, M.D., F.A.C.S.      Junie Panning G 10/26/2016, 12:54 PM  Patient has been asked to stop Plavix five days prior to procedure. It is okay for patient to continue an 81 mg aspirin once daily. She will be contacted to arrange a date for surgery once we have the okay from Dr. [name] and Dr. Holley Raring.  Dominga Ferry, CMA

## 2016-10-27 ENCOUNTER — Telehealth: Payer: Self-pay | Admitting: *Deleted

## 2016-10-27 NOTE — Telephone Encounter (Signed)
Would confirm no new sx since last office visit If no changes, acceptable risk, Ok to hold plavix 5 days, Stay on aspirin

## 2016-10-27 NOTE — Telephone Encounter (Signed)
Request received for Cardiac clearance for gallbladder surgery. Patient last office visit 09/25/16 with previous echo 10/28/15 which was normal, Stress test 11/2014 which was normal, and Left heart cath 06/04/15. She is also on Aspirin and plavix as well. Will route to Dr. [name] for review and recommendations.

## 2016-10-27 NOTE — Telephone Encounter (Signed)
-----   Message from Dominga Ferry, [place] sent at 10/26/2016 11:45 AM EDT ----- Patient last seen by you on 09-25-16. Dr. Jamal Collin is requesting cardiac clearance prior to gallbladder surgery. Date TBA. Can you clear patient based upon last office visit or will she need an appointment? If no appointment is needed, can you make a note in EPIC that states she is cleared for surgery? Thank you!

## 2016-10-28 NOTE — Telephone Encounter (Signed)
Spoke with patient and she states that she has had no new symptoms and just needs her gallbladder removed. Let her know that I would route this clearance over to their office and to give [place] a call if she needs anything else. She was appreciative for the call and had no further questions at this time.

## 2016-10-29 NOTE — Telephone Encounter (Signed)
Note routed over to [place].

## 2016-11-06 ENCOUNTER — Telehealth: Payer: Self-pay

## 2016-11-06 NOTE — Telephone Encounter (Signed)
Call to patient to speak with her about surgery. She is scheduled for surgery at [facility] on 11/18/16. She will pre admit at the hospital on 11/10/16 at 8:45 am. She is aware to stop her Plavix 5 days prior to surgery.

## 2016-11-09 ENCOUNTER — Other Ambulatory Visit: Payer: Self-pay | Admitting: General Surgery

## 2016-11-09 DIAGNOSIS — K8010 Calculus of gallbladder with chronic cholecystitis without obstruction: Secondary | ICD-10-CM

## 2016-11-10 ENCOUNTER — Encounter
Admission: RE | Admit: 2016-11-10 | Discharge: 2016-11-10 | Disposition: A | Discharge status: Home / Self Care | Payer: Medicare Other | Source: Ambulatory Visit | Attending: General Surgery | Admitting: General Surgery

## 2016-11-10 DIAGNOSIS — Z01818 Encounter for other preprocedural examination: Secondary | ICD-10-CM | POA: Insufficient documentation

## 2016-11-10 NOTE — Pre-Procedure Instructions (Signed)
PATIENT SEEN BY DR [name] 09/25/16. REQUEST FOR CLEARANCE STATEMENT FAXED AND SPOKE WITH HIRA.

## 2016-11-10 NOTE — Patient Instructions (Signed)
Your procedure is scheduled on: Wednesday Oct. 17, 2018. Report to Same Day Surgery.. To find out your arrival time please call [phone] between 1PM - 3PM on Tuesday Oct. 16, 2018.  Remember: Instructions that are not followed completely may result in serious medical risk, up to and including death, or upon the discretion of your surgeon and anesthesiologist your surgery may need to be rescheduled.     _X__ 1. Do not eat food after midnight the night before your procedure.                 No gum chewing or hard candies. You may drink clear liquids up to 2 hours                 before you are scheduled to arrive for your surgery- DO not drink clear                 liquids within 2 hours of the start of your surgery.                 Clear liquids include water only.     ___ 2.  No Alcohol for 24 hours before or after surgery.   ___ 3.  Do Not Smoke or use e-cigarettes For 24 Hours Prior to Your Surgery.                 Do not use any chewable tobacco products for at least 6 hours prior to                 surgery.  ____  4.  Bring all medications with you on the day of surgery if instructed.   __x__  5.  Notify your doctor if there is any change in your medical condition      (cold, fever, infections).     Do not wear jewelry, make-up, hairpins, clips or nail polish. Do not wear lotions, powders, or perfumes. You may wear deodorant. Do not shave 48 hours prior to surgery. Men may shave face and neck. Do not bring valuables to the hospital.    [facility] is not responsible for any belongings or valuables.  Contacts, dentures or bridgework may not be worn into surgery. Leave your suitcase in the car. After surgery it may be brought to your room. For patients admitted to the hospital, discharge time is determined by your treatment team.   Patients discharged the day of surgery will not be allowed to drive home.   Please read over the following fact sheets  that you were given:   Preparing for Surgery          ____ Take these medicines the morning of surgery with A SIP OF WATER:    1. citalopram (CELEXA)  2. diltiazem (CARDIZEM CD)  3. gabapentin (NEURONTIN)  4. isosorbide mononitrate (IMDUR)  5.levothyroxine (SYNTHROID, LEVOTHROID  6. metoprolol succinate (TOPROL-XL)  7. pantoprazole (PROTONIX)  ____ Fleet Enema (as directed)   _x___ Use CHG Soap as directed  ____ Use inhalers on the day of surgery  __x__ Stop metformin 2 days prior to surgery,  Last dose will be on Sunday evening.    ____ Take 1/2 of usual insulin dose the night before surgery. No insulin the morning          of surgery.   _x___ Stop Plavix 5 days prior to surgery per Dr. Donivan Scull instructions.  ____ Stop Anti-inflammatories: naproxen sodium (ANAPROX) now, OK to take Tylenol for  pain.   ____ Stop supplements until after surgery.    ____ Bring C-Pap to the hospital.

## 2016-11-10 NOTE — Pre-Procedure Instructions (Signed)
Cardiac and Renal clearances on chart.

## 2016-11-16 ENCOUNTER — Ambulatory Visit: Payer: Self-pay | Admitting: Family Medicine

## 2016-11-17 MED ORDER — CEFAZOLIN SODIUM-DEXTROSE 2-4 GM/100ML-% IV SOLN
2.0000 g | INTRAVENOUS | Status: AC
  Administered 2016-11-18: 2 g via INTRAVENOUS

## 2016-11-18 ENCOUNTER — Ambulatory Visit
Admission: RE | Admit: 2016-11-18 | Discharge: 2016-11-18 | Disposition: A | Discharge status: Home / Self Care | Payer: Medicare Other | Source: Ambulatory Visit | Attending: General Surgery | Admitting: General Surgery

## 2016-11-18 ENCOUNTER — Ambulatory Visit: Payer: Medicare Other

## 2016-11-18 ENCOUNTER — Encounter: Payer: Self-pay | Admitting: Emergency Medicine

## 2016-11-18 ENCOUNTER — Encounter
Admission: RE | Disposition: A | Discharge status: Home / Self Care | Payer: Self-pay | Source: Ambulatory Visit | Attending: General Surgery

## 2016-11-18 ENCOUNTER — Ambulatory Visit: Payer: Medicare Other | Admitting: Anesthesiology

## 2016-11-18 DIAGNOSIS — K8010 Calculus of gallbladder with chronic cholecystitis without obstruction: Secondary | ICD-10-CM | POA: Diagnosis not present

## 2016-11-18 DIAGNOSIS — I252 Old myocardial infarction: Secondary | ICD-10-CM | POA: Diagnosis not present

## 2016-11-18 DIAGNOSIS — K8020 Calculus of gallbladder without cholecystitis without obstruction: Secondary | ICD-10-CM | POA: Diagnosis not present

## 2016-11-18 DIAGNOSIS — I2510 Atherosclerotic heart disease of native coronary artery without angina pectoris: Secondary | ICD-10-CM | POA: Diagnosis not present

## 2016-11-18 DIAGNOSIS — N183 Chronic kidney disease, stage 3 (moderate): Secondary | ICD-10-CM | POA: Diagnosis not present

## 2016-11-18 DIAGNOSIS — E1122 Type 2 diabetes mellitus with diabetic chronic kidney disease: Secondary | ICD-10-CM | POA: Diagnosis not present

## 2016-11-18 DIAGNOSIS — Z7982 Long term (current) use of aspirin: Secondary | ICD-10-CM | POA: Diagnosis not present

## 2016-11-18 DIAGNOSIS — Z7984 Long term (current) use of oral hypoglycemic drugs: Secondary | ICD-10-CM | POA: Diagnosis not present

## 2016-11-18 DIAGNOSIS — Z87891 Personal history of nicotine dependence: Secondary | ICD-10-CM | POA: Insufficient documentation

## 2016-11-18 DIAGNOSIS — R200 Anesthesia of skin: Secondary | ICD-10-CM | POA: Diagnosis not present

## 2016-11-18 DIAGNOSIS — Z79899 Other long term (current) drug therapy: Secondary | ICD-10-CM | POA: Insufficient documentation

## 2016-11-18 DIAGNOSIS — Z9884 Bariatric surgery status: Secondary | ICD-10-CM | POA: Insufficient documentation

## 2016-11-18 DIAGNOSIS — E119 Type 2 diabetes mellitus without complications: Secondary | ICD-10-CM | POA: Diagnosis not present

## 2016-11-18 DIAGNOSIS — I509 Heart failure, unspecified: Secondary | ICD-10-CM | POA: Diagnosis not present

## 2016-11-18 DIAGNOSIS — F419 Anxiety disorder, unspecified: Secondary | ICD-10-CM | POA: Insufficient documentation

## 2016-11-18 DIAGNOSIS — F329 Major depressive disorder, single episode, unspecified: Secondary | ICD-10-CM | POA: Diagnosis not present

## 2016-11-18 DIAGNOSIS — Z9889 Other specified postprocedural states: Secondary | ICD-10-CM | POA: Diagnosis not present

## 2016-11-18 DIAGNOSIS — R109 Unspecified abdominal pain: Secondary | ICD-10-CM | POA: Diagnosis present

## 2016-11-18 DIAGNOSIS — K219 Gastro-esophageal reflux disease without esophagitis: Secondary | ICD-10-CM | POA: Diagnosis not present

## 2016-11-18 DIAGNOSIS — Z7902 Long term (current) use of antithrombotics/antiplatelets: Secondary | ICD-10-CM | POA: Diagnosis not present

## 2016-11-18 DIAGNOSIS — F319 Bipolar disorder, unspecified: Secondary | ICD-10-CM | POA: Diagnosis not present

## 2016-11-18 DIAGNOSIS — I69351 Hemiplegia and hemiparesis following cerebral infarction affecting right dominant side: Secondary | ICD-10-CM | POA: Insufficient documentation

## 2016-11-18 DIAGNOSIS — Z419 Encounter for procedure for purposes other than remedying health state, unspecified: Secondary | ICD-10-CM

## 2016-11-18 DIAGNOSIS — G35 Multiple sclerosis: Secondary | ICD-10-CM | POA: Diagnosis not present

## 2016-11-18 DIAGNOSIS — I129 Hypertensive chronic kidney disease with stage 1 through stage 4 chronic kidney disease, or unspecified chronic kidney disease: Secondary | ICD-10-CM | POA: Diagnosis not present

## 2016-11-18 DIAGNOSIS — I69398 Other sequelae of cerebral infarction: Secondary | ICD-10-CM | POA: Insufficient documentation

## 2016-11-18 DIAGNOSIS — E785 Hyperlipidemia, unspecified: Secondary | ICD-10-CM | POA: Insufficient documentation

## 2016-11-18 DIAGNOSIS — E039 Hypothyroidism, unspecified: Secondary | ICD-10-CM | POA: Diagnosis not present

## 2016-11-18 DIAGNOSIS — I110 Hypertensive heart disease with heart failure: Secondary | ICD-10-CM | POA: Diagnosis not present

## 2016-11-18 DIAGNOSIS — E1140 Type 2 diabetes mellitus with diabetic neuropathy, unspecified: Secondary | ICD-10-CM | POA: Diagnosis not present

## 2016-11-18 DIAGNOSIS — Z955 Presence of coronary angioplasty implant and graft: Secondary | ICD-10-CM | POA: Insufficient documentation

## 2016-11-18 HISTORY — PX: CHOLECYSTECTOMY: SHX55

## 2016-11-18 LAB — GLUCOSE, CAPILLARY
Glucose-Capillary: 61 mg/dL — ABNORMAL LOW (ref 65–99)
Glucose-Capillary: 168 mg/dL — ABNORMAL HIGH (ref 65–99)
Glucose-Capillary: 82 mg/dL (ref 65–99)

## 2016-11-18 LAB — POCT PREGNANCY, URINE: PREG TEST UR: NEGATIVE

## 2016-11-18 SURGERY — LAPAROSCOPIC CHOLECYSTECTOMY WITH INTRAOPERATIVE CHOLANGIOGRAM
Anesthesia: General

## 2016-11-18 MED ORDER — GLYCOPYRROLATE 0.2 MG/ML IJ SOLN
INTRAMUSCULAR | Status: DC | PRN
  Administered 2016-11-18: 0.2 mg via INTRAVENOUS

## 2016-11-18 MED ORDER — OXYCODONE HCL 5 MG PO TABS
5.0000 mg | ORAL_TABLET | Freq: Once | ORAL | Status: AC
  Administered 2016-11-18: 5 mg via ORAL

## 2016-11-18 MED ORDER — PROPOFOL 10 MG/ML IV BOLUS
INTRAVENOUS | Status: AC
  Filled 2016-11-18: qty 20

## 2016-11-18 MED ORDER — DEXTROSE-NACL 5-0.45 % IV SOLN
INTRAVENOUS | Status: DC
  Administered 2016-11-18: 11:00:00 via INTRAVENOUS

## 2016-11-18 MED ORDER — FENTANYL CITRATE (PF) 100 MCG/2ML IJ SOLN
INTRAMUSCULAR | Status: AC
  Administered 2016-11-18: 25 ug via INTRAVENOUS
  Filled 2016-11-18: qty 2

## 2016-11-18 MED ORDER — FENTANYL CITRATE (PF) 100 MCG/2ML IJ SOLN
25.0000 ug | INTRAMUSCULAR | Status: AC | PRN
  Administered 2016-11-18 (×6): 25 ug via INTRAVENOUS

## 2016-11-18 MED ORDER — SODIUM CHLORIDE 0.9 % IJ SOLN
INTRAMUSCULAR | Status: AC
  Filled 2016-11-18: qty 50

## 2016-11-18 MED ORDER — ACETAMINOPHEN 10 MG/ML IV SOLN
INTRAVENOUS | Status: DC | PRN
  Administered 2016-11-18: 1000 mg via INTRAVENOUS

## 2016-11-18 MED ORDER — PHENYLEPHRINE HCL 10 MG/ML IJ SOLN
INTRAMUSCULAR | Status: DC | PRN
  Administered 2016-11-18: 100 ug via INTRAVENOUS

## 2016-11-18 MED ORDER — FENTANYL CITRATE (PF) 100 MCG/2ML IJ SOLN
INTRAMUSCULAR | Status: AC
  Filled 2016-11-18: qty 2

## 2016-11-18 MED ORDER — SUGAMMADEX SODIUM 200 MG/2ML IV SOLN
INTRAVENOUS | Status: AC
  Filled 2016-11-18: qty 2

## 2016-11-18 MED ORDER — PROPOFOL 10 MG/ML IV BOLUS
INTRAVENOUS | Status: DC | PRN
  Administered 2016-11-18: 150 mg via INTRAVENOUS

## 2016-11-18 MED ORDER — ONDANSETRON HCL 4 MG/2ML IJ SOLN
4.0000 mg | Freq: Once | INTRAMUSCULAR | Status: AC | PRN
  Administered 2016-11-18: 4 mg via INTRAVENOUS

## 2016-11-18 MED ORDER — MIDAZOLAM HCL 2 MG/2ML IJ SOLN
INTRAMUSCULAR | Status: AC
  Filled 2016-11-18: qty 2

## 2016-11-18 MED ORDER — SODIUM CHLORIDE 0.9 % IV SOLN
INTRAVENOUS | Status: DC
  Administered 2016-11-18 (×2): via INTRAVENOUS

## 2016-11-18 MED ORDER — LIDOCAINE HCL (CARDIAC) 20 MG/ML IV SOLN
INTRAVENOUS | Status: DC | PRN
  Administered 2016-11-18: 40 mg via INTRAVENOUS

## 2016-11-18 MED ORDER — SODIUM CHLORIDE 0.9 % IV SOLN
INTRAVENOUS | Status: DC | PRN
  Administered 2016-11-18: 10 mL

## 2016-11-18 MED ORDER — MIDAZOLAM HCL 2 MG/2ML IJ SOLN
INTRAMUSCULAR | Status: DC | PRN
  Administered 2016-11-18: 2 mg via INTRAVENOUS

## 2016-11-18 MED ORDER — ROCURONIUM BROMIDE 100 MG/10ML IV SOLN
INTRAVENOUS | Status: DC | PRN
  Administered 2016-11-18 (×2): 5 mg via INTRAVENOUS
  Administered 2016-11-18: 35 mg via INTRAVENOUS

## 2016-11-18 MED ORDER — KETOROLAC TROMETHAMINE 30 MG/ML IJ SOLN
INTRAMUSCULAR | Status: DC | PRN
  Administered 2016-11-18: 30 mg via INTRAVENOUS

## 2016-11-18 MED ORDER — SUGAMMADEX SODIUM 500 MG/5ML IV SOLN
INTRAVENOUS | Status: DC | PRN
  Administered 2016-11-18: 200 mg via INTRAVENOUS

## 2016-11-18 MED ORDER — CHLORHEXIDINE GLUCONATE CLOTH 2 % EX PADS: 6.0000 | MEDICATED_PAD | Freq: Once | CUTANEOUS | Status: DC

## 2016-11-18 MED ORDER — FENTANYL CITRATE (PF) 100 MCG/2ML IJ SOLN
INTRAMUSCULAR | Status: DC | PRN
  Administered 2016-11-18 (×2): 50 ug via INTRAVENOUS

## 2016-11-18 MED ORDER — ROCURONIUM BROMIDE 50 MG/5ML IV SOLN
INTRAVENOUS | Status: AC
  Filled 2016-11-18: qty 1

## 2016-11-18 MED ORDER — CEFAZOLIN SODIUM-DEXTROSE 2-4 GM/100ML-% IV SOLN
INTRAVENOUS | Status: AC
  Filled 2016-11-18: qty 100

## 2016-11-18 MED ORDER — OXYCODONE HCL 5 MG PO TABS
ORAL_TABLET | ORAL | Status: AC
  Administered 2016-11-18: 5 mg via ORAL
  Filled 2016-11-18: qty 1

## 2016-11-18 MED ORDER — OXYCODONE HCL 5 MG PO TABS: 5.0000 mg | ORAL_TABLET | ORAL | 0 refills | Status: DC | PRN

## 2016-11-18 MED ORDER — ONDANSETRON HCL 4 MG/2ML IJ SOLN
INTRAMUSCULAR | Status: AC
  Administered 2016-11-18: 4 mg via INTRAVENOUS
  Filled 2016-11-18: qty 2

## 2016-11-18 MED ORDER — SEVOFLURANE IN SOLN
RESPIRATORY_TRACT | Status: AC
  Filled 2016-11-18: qty 250

## 2016-11-18 MED ORDER — ACETAMINOPHEN 10 MG/ML IV SOLN
INTRAVENOUS | Status: AC
  Filled 2016-11-18: qty 100

## 2016-11-18 MED ORDER — KETOROLAC TROMETHAMINE 30 MG/ML IJ SOLN
INTRAMUSCULAR | Status: AC
  Filled 2016-11-18: qty 1

## 2016-11-18 SURGICAL SUPPLY — 43 items
AGENT HMST MTR 8 SURGIFLO (HEMOSTASIS)
ANCHOR TIS RET SYS 235ML (MISCELLANEOUS) ×3 IMPLANT
APL ESCP 34 STRL LF DISP (HEMOSTASIS)
APPLICATOR SURGIFLO ENDO (HEMOSTASIS) IMPLANT
APPLIER CLIP LOGIC TI 5 (MISCELLANEOUS) ×3 IMPLANT
APR CLP MED LRG 33X5 (MISCELLANEOUS) ×1
BAG TISS RTRVL C235 10X14 (MISCELLANEOUS) ×1
BLADE SURG 11 STRL SS SAFETY (MISCELLANEOUS) ×3 IMPLANT
CANISTER SUCT 1200ML W/VALVE (MISCELLANEOUS) ×3 IMPLANT
CANNULA DILATOR 10 W/SLV (CANNULA) ×2 IMPLANT
CANNULA DILATOR 10MM W/SLV (CANNULA) ×1
CATH CHOLANG 76X19 KUMAR (CATHETERS) ×3 IMPLANT
CHLORAPREP W/TINT 26ML (MISCELLANEOUS) ×3 IMPLANT
CLOSURE WOUND 1/2 X4 (GAUZE/BANDAGES/DRESSINGS) ×1
DEFOGGER SCOPE WARMER CLEARIFY (MISCELLANEOUS) ×3 IMPLANT
DRAPE C-ARM XRAY 36X54 (DRAPES) ×3 IMPLANT
DRAPE INCISE IOBAN 66X45 STRL (DRAPES) ×3 IMPLANT
DRSG TEGADERM 2-3/8X2-3/4 SM (GAUZE/BANDAGES/DRESSINGS) ×12 IMPLANT
DRSG TELFA 4X3 1S NADH ST (GAUZE/BANDAGES/DRESSINGS) ×3 IMPLANT
ELECT REM PT RETURN 9FT ADLT (ELECTROSURGICAL) ×3
ELECTRODE REM PT RTRN 9FT ADLT (ELECTROSURGICAL) ×1 IMPLANT
GLOVE BIO SURGEON STRL SZ7 (GLOVE) ×3 IMPLANT
GOWN STRL REUS W/ TWL LRG LVL3 (GOWN DISPOSABLE) ×3 IMPLANT
GOWN STRL REUS W/TWL LRG LVL3 (GOWN DISPOSABLE) ×9
GRASPER SUT TROCAR 14GX15 (MISCELLANEOUS) ×3 IMPLANT
HEMOSTAT SURGICEL 2X3 (HEMOSTASIS) IMPLANT
IRRIGATION STRYKERFLOW (MISCELLANEOUS) ×1 IMPLANT
IRRIGATOR STRYKERFLOW (MISCELLANEOUS) ×3
IV LACTATED RINGERS 1000ML (IV SOLUTION) ×3 IMPLANT
KIT RM TURNOVER STRD PROC AR (KITS) ×3 IMPLANT
LABEL OR SOLS (LABEL) ×3 IMPLANT
NDL INSUFF ACCESS 14 VERSASTEP (NEEDLE) ×3 IMPLANT
PACK LAP CHOLECYSTECTOMY (MISCELLANEOUS) ×3 IMPLANT
SCISSORS METZENBAUM CVD 33 (INSTRUMENTS) ×3 IMPLANT
SLEEVE ENDOPATH XCEL 5M (ENDOMECHANICALS) ×6 IMPLANT
SPOGE SURGIFLO 8M (HEMOSTASIS)
SPONGE SURGIFLO 8M (HEMOSTASIS) IMPLANT
STRIP CLOSURE SKIN 1/2X4 (GAUZE/BANDAGES/DRESSINGS) ×2 IMPLANT
SUT VIC AB 0 SH 27 (SUTURE) ×3 IMPLANT
SUT VIC AB 4-0 FS2 27 (SUTURE) ×3 IMPLANT
SWABSTK COMLB BENZOIN TINCTURE (MISCELLANEOUS) ×3 IMPLANT
TROCAR XCEL NON-BLD 5MMX100MML (ENDOMECHANICALS) ×3 IMPLANT
TUBING INSUFFLATOR HI FLOW (MISCELLANEOUS) ×3 IMPLANT

## 2016-11-18 NOTE — Anesthesia Post-op Follow-up Note (Signed)
Anesthesia QCDR form completed.        

## 2016-11-18 NOTE — Interval H&P Note (Signed)
History and Physical Interval Note:  11/18/2016 12:27 PM  [place]  has presented today for surgery, with the diagnosis of CHOLELITHIASIS  The various methods of treatment have been discussed with the patient and family. After consideration of risks, benefits and other options for treatment, the patient has consented to  Procedure(s): LAPAROSCOPIC CHOLECYSTECTOMY WITH INTRAOPERATIVE CHOLANGIOGRAM (N/A) as a surgical intervention .  The patient's history has been reviewed, patient examined, no change in status, stable for surgery.  I have reviewed the patient's chart and labs.  Questions were answered to the patient's satisfaction.     Rmoni Keplinger G

## 2016-11-18 NOTE — Transfer of Care (Signed)
Immediate Anesthesia Transfer of Care Note  Patient: [place]  Procedure(s) Performed: LAPAROSCOPIC CHOLECYSTECTOMY WITH INTRAOPERATIVE CHOLANGIOGRAM (N/A )  Patient Location: PACU  Anesthesia Type:General  Level of Consciousness: sedated  Airway & Oxygen Therapy: Patient Spontanous Breathing and Patient connected to face mask oxygen  Post-op Assessment: Report given to RN and Post -op Vital signs reviewed and stable  Post vital signs: Reviewed and stable  Last Vitals:  Vitals:   11/18/16 1116 11/18/16 1440  BP: 137/87   Pulse: (!) 53   Resp: 17   Temp: (!) 35.6 C (P) 36.6 C  SpO2: 100%     Last Pain:  Vitals:   11/18/16 1116  TempSrc: Tympanic         Complications: No apparent anesthesia complications

## 2016-11-18 NOTE — Anesthesia Postprocedure Evaluation (Signed)
Anesthesia Post Note  Patient: [place]  Procedure(s) Performed: LAPAROSCOPIC CHOLECYSTECTOMY WITH INTRAOPERATIVE CHOLANGIOGRAM (N/A )  Patient location during evaluation: PACU Anesthesia Type: General Level of consciousness: awake and alert Pain management: pain level controlled Vital Signs Assessment: post-procedure vital signs reviewed and stable Respiratory status: spontaneous breathing, nonlabored ventilation, respiratory function stable and patient connected to nasal cannula oxygen Cardiovascular status: blood pressure returned to baseline and stable Postop Assessment: no apparent nausea or vomiting Anesthetic complications: no     Last Vitals:  Vitals:   11/18/16 1558 11/18/16 1622  BP: 132/67 138/70  Pulse: 72 68  Resp: 16   Temp: (!) 36.3 C   SpO2: 96% 99%    Last Pain:  Vitals:   11/18/16 1622  TempSrc:   PainSc: 4                  Martha Clan

## 2016-11-18 NOTE — Progress Notes (Signed)
States wants to go home

## 2016-11-18 NOTE — H&P (View-Only) (Signed)
Patient ID: [name], female   DOB: August 03, 1970, 46 y.o.   MRN: [id number]  Chief Complaint  Patient presents with  . Abdominal Pain    HPI [name] is a 46 y.o. female is here today for an evaluation of abdominal pain. Patient stated the pain started about one month ago. The pain is located near the right upper abdomen. Patient had an ultrasound done on 10/20/16. Patient states she has been vomiting and feeling nauseous all the time. She state she can only eat mashed potatoes. She has been having diarrhea. This has been going on for about 3 weeks. History gastric bypass surgery with 100 lb weight loss, gastric ulcers, GERD, DM, CAD with 2 stents placed, stage 3 CKD. Sees Dr. [name] as her cardiologist and Dr. Holley Raring as her nephrologist.  HPI  Past Medical History:  Diagnosis Date  . Anemia    iron deficiency anemia  . Anxiety   . Aortic arch aneurysm ([place])   . Arthritis   . Bipolar disorder ([place])   . BRCA negative 2014  . CAD (coronary artery disease)    a. cath 08/2003 nonobs dzs;  b. 11/2014: mLAD 85-90% s/p PCI/DES (Xience 3.25 x 23); c. cath 04/2015: 70% mD1 @ bifurcation. Patent LAD stent - > Med Rx.; d. Cath-PCI 5/17: Patent LAD stent, D1 now ~80% - PCI Resolute DES 2.25 x 12.    . Chronic systolic CHF (congestive heart failure) ([place])    a. echo 01/29/2013 EF 40-45%, basal anteroseptal & mid anteroseptal segments abnl; b echo 05/2012 EF 50-55%, mildly dilated LA, nl RVSP; c. 10/2015 Echo: EF 55-60%, mild conc LVH, no rwma.  . CKD (chronic kidney disease), stage IV ([place])   . Colon polyp   . CVA (cerebral vascular accident) ([place])    a. 06/2008-TEE nl LV fxn; b. 01/2013 - notes indicate short run of SVT at that time  . Depression   . Diabetes mellitus without complication ([place])   . Family history of breast cancer    BRCA neg 2014  . Gastric ulcer 04/27/2011  . GERD (gastroesophageal reflux disease)   . Headache    Migraines  . Hyperlipidemia   . Hypertension   .  Hypertensive heart disease   . Hypothyroidism   . Ischemic cardiomyopathy    a. EF prev 40-45% in 2014-->Improved to 55-60% (10/2015).  . Malignant melanoma of skin of scalp ([place])   . MI, acute, non ST segment elevation ([place])   . Morbid obesity ([place])    a. s/p gastric bypass.  . Multiple sclerosis ([place]) 2015  . Myocardial infarction ([place]) 06/03/2015  . Neuromuscular disorder ([place])   . Neuropathy   . Orthostatic hypotension   . S/P drug eluting coronary stent placement 06/04/2015  . Syncope     Past Surgical History:  Procedure Laterality Date  . APPENDECTOMY    . CARDIAC CATHETERIZATION N/A 11/09/2014   Procedure: Coronary Angiography;  Surgeon: Minna Merritts, MD;  Location: [facility];  Service: Cardiovascular;  Laterality: N/A;  . CARDIAC CATHETERIZATION N/A 11/12/2014   Procedure: Coronary Stent Intervention;  Surgeon: Isaias Cowman, MD;  Location: [facility];  Service: Cardiovascular;  Laterality: N/A;  . CARDIAC CATHETERIZATION N/A 04/18/2015   Procedure: Left Heart Cath and Coronary Angiography;  Surgeon: Minna Merritts, MD;  Location: [facility];  Service: Cardiovascular;  Laterality: N/A;  . CARDIAC CATHETERIZATION Left 06/04/2015   Procedure: Left Heart Cath and Coronary  Angiography;  Surgeon: Wellington Hampshire, MD;  Location: [facility];  Service: Cardiovascular;  Laterality: Left;  . CARDIAC CATHETERIZATION N/A 06/04/2015   Procedure: Coronary Stent Intervention;  Surgeon: Wellington Hampshire, MD;  Location: [facility];  Service: Cardiovascular;  Laterality: N/A;  . CESAREAN SECTION  2001  . COLONOSCOPY WITH PROPOFOL N/A 04/27/2016   Procedure: COLONOSCOPY WITH PROPOFOL;  Surgeon: [name], MD;  Location: [facility];  Service: Endoscopy;  Laterality: N/A;  . CORONARY ANGIOPLASTY    . DILATION AND CURETTAGE OF UTERUS    . ESOPHAGOGASTRODUODENOSCOPY (EGD) WITH PROPOFOL N/A 09/14/2014   Procedure:  ESOPHAGOGASTRODUODENOSCOPY (EGD) WITH PROPOFOL;  Surgeon: Josefine Class, MD;  Location[facility];  Service: Endoscopy;  Laterality: N/A;  . ESOPHAGOGASTRODUODENOSCOPY (EGD) WITH PROPOFOL N/A 04/27/2016   Procedure: ESOPHAGOGASTRODUODENOSCOPY (EGD) WITH PROPOFOL;  Surgeon: [name], MD;  Location: [facility];  Service: Endoscopy;  Laterality: N/A;  Diabetic - oral meds  . GASTRIC BYPASS  09/2009   [facility]   . Left Carotid to sublcavian artery bypass w/ subclavian artery ligation     a. Performed @ [facility].  . MELANOMA EXCISION  2016   Dr. Evorn Gong  . [place]  2002  . RIGHT OOPHORECTOMY    . SHOULDER ARTHROSCOPY WITH OPEN ROTATOR CUFF REPAIR Right 01/07/2016   Procedure: SHOULDER ARTHROSCOPY WITH DEBRIDMENT, SUBACHROMIAL DECOMPRESSION;  Surgeon: Corky Mull, MD;  Location: [facility];  Service: Orthopedics;  Laterality: Right;  . TRIGGER FINGER RELEASE Right     [place]  . TUBAL LIGATION     R tube removed still w/ Left    Family History  Problem Relation Age of Onset  . Hypertension Mother   . Anxiety disorder Mother   . Depression Mother   . Bipolar disorder Mother   . Heart disease Mother   . Hyperlipidemia Mother   . Kidney disease Father   . Heart disease Father   . Hypertension Father   . Diabetes Father   . Stroke Father   . Colon cancer Father        dx in his 67's  . Anxiety disorder Father   . Depression Father   . Skin cancer Father   . Kidney disease Sister   . Thyroid nodules Sister   . Hypertension Sister   . Hypertension Sister   . Diabetes Sister   . Hyperlipidemia Sister   . Depression Sister   . Breast cancer Maternal Aunt 70  . Breast cancer Maternal Aunt 57  . Ovarian cancer Cousin   . Colon cancer Cousin   . Breast cancer Other   . Kidney cancer Neg Hx   . Bladder Cancer Neg Hx     Social History Social History  Substance Use Topics  . Smoking status: Former Smoker    Types: Cigarettes      Quit date: 08/31/1994  . Smokeless tobacco: Never Used     Comment: quit 28 years ago  . Alcohol use No    No Known Allergies  Current Outpatient Prescriptions  Medication Sig Dispense Refill  . ALPRAZolam (XANAX) 0.5 MG tablet TAKE 1/2 TO 1 TABLET BY MOUTH EVERY 6 HOURS AS NEEDED 60 tablet 5  . aspirin EC 81 MG tablet Take 1 tablet (81 mg total) by mouth daily.    . citalopram (CELEXA) 40 MG tablet TAKE ONE TABLET BY MOUTH ONCE DAILY 30 tablet 5  . clopidogrel (PLAVIX) 75 MG tablet  Take 1 tablet (75 mg total) by mouth daily. 30 tablet 11  . diltiazem (CARDIZEM CD) 120 MG 24 hr capsule Take 1 capsule (120 mg total) by mouth daily. 90 capsule 3  . gabapentin (NEURONTIN) 300 MG capsule TAKE ONE CAPSULE BY MOUTH THREE TIMES DAILY 90 capsule 12  . isosorbide mononitrate (IMDUR) 60 MG 24 hr tablet Take 1 tablet (60 mg total) by mouth daily. 30 tablet 6  . levothyroxine (SYNTHROID, LEVOTHROID) 25 MCG tablet Take 1 tablet (25 mcg total) by mouth daily before breakfast. 30 tablet 12  . losartan (COZAAR) 50 MG tablet Take 1 tablet (50 mg total) by mouth daily. (Patient taking differently: Take 100 mg by mouth daily. ) 30 tablet 12  . metFORMIN (GLUCOPHAGE) 1000 MG tablet TAKE ONE TABLET BY MOUTH TWICE DAILY 60 tablet 12  . metoprolol succinate (TOPROL-XL) 25 MG 24 hr tablet Take 1 tablet (25 mg total) by mouth 2 (two) times daily. Take with or immediately following a meal. 60 tablet 9  . nitroGLYCERIN (NITROSTAT) 0.4 MG SL tablet Place 1 tablet (0.4 mg total) under the tongue every 5 (five) minutes as needed for chest pain. 25 tablet 6  . OVER THE COUNTER MEDICATION Take 2 tablets by mouth daily. B-12 gummies    . pantoprazole (PROTONIX) 40 MG tablet Take 1 tablet (40 mg total) by mouth 2 (two) times daily. 60 tablet 5  . promethazine (PHENERGAN) 25 MG tablet Take 1 tablet (25 mg total) by mouth every 6 (six) hours as needed for nausea or vomiting. Reported on 05/31/2015 30 tablet 3  . traZODone  (DESYREL) 100 MG tablet Take 2 tablets (200 mg total) by mouth at bedtime. 30 tablet 6  . zolpidem (AMBIEN) 5 MG tablet Take 1-2 tablets (5-10 mg total) by mouth at bedtime as needed for sleep. 40 tablet 3   No current facility-administered medications for this visit.     Review of Systems Review of Systems  Constitutional: Negative.   Respiratory: Negative.   Cardiovascular: Negative.   Gastrointestinal: Positive for abdominal pain, diarrhea, nausea and vomiting.    Blood pressure 118/72, pulse 70, resp. rate 12, height _0  (1.575 m), weight 159 lb (72.1 kg).  Physical Exam Physical Exam  Constitutional: She is oriented to person, place, and time. She appears well-developed and well-nourished.  Eyes: Conjunctivae are normal. No scleral icterus.  Cardiovascular: Normal rate, regular rhythm and normal heart sounds.   Pulmonary/Chest: Effort normal and breath sounds normal. No respiratory distress.  Abdominal: Soft. Bowel sounds are normal.    Mild RUQ tenderness  Neurological: She is alert and oriented to person, place, and time.  Skin: Skin is warm and dry.    Data Reviewed Prior US, labs, and notes reviewed.  Assessment    Cholelithiasis - [place] on 10/20/2016 showed sludge and possible gallstone without wall thickening. CMP on 10/14/2016 showed elevated Alk phos 124, liver function otherwise normal. Amylase and CBC were normal.   CKD, stage 3 -  CMP on 10/14/16 shows Cr 1.92, GFR 30, BUN 14. Being followed by Dr. Holley Raring and sees him every 4 months, not on dialysis.  Coronary artery disease with stent placement 06/2015 - followed by Dr. [name], on Plavix.   DM- on oral agent    Plan    Discussed laparoscopic cholecystectomy with patient and she was agreeable. Will consult with Dr. [name] for cardiac clearance and Dr. Holley Raring, her nephrologist, prior to cholecystectomy.   Laparoscopic Cholecystectomy with Intraoperative Cholangiogram. The procedure,  including it's potential  risks and complications (including but not limited to infection, bleeding, injury to intra-abdominal organs or bile ducts, bile leak, poor cosmetic result, sepsis and death) were discussed with the patient in detail. Non-operative options, including their inherent risks (acute calculous cholecystitis with possible choledocholithiasis or gallstone pancreatitis, with the risk of ascending cholangitis, sepsis, and death) were discussed as well. The patient expressed and understanding of what we discussed and wishes to proceed with laparoscopic cholecystectomy. The patient further understands that if it is technically not possible, or it is unsafe to proceed laparoscopically, that I will convert to an open cholecystectomy.  HPI, Physical Exam, Assessment and Plan have been scribed under the direction and in the presence of Kendra Jewel, MD.  [name], CMA   I have completed the exam and reviewed the above documentation for accuracy and completeness.  I agree with the above.  Haematologist has been used and any errors in dictation or transcription are unintentional.  [name] G. Jamal Collin, M.D., F.A.C.S.      [name] G 10/26/2016, 12:54 PM  Patient has been asked to stop Plavix five days prior to procedure. It is okay for patient to continue an 81 mg aspirin once daily. She will be contacted to arrange a date for surgery once we have the okay from Dr. [name] and Dr. Holley Raring.  [name], CMA

## 2016-11-18 NOTE — Anesthesia Procedure Notes (Signed)
Procedure Name: Intubation Date/Time: 11/18/2016 1:20 PM Performed by: Melinda Gwinner Pre-anesthesia Checklist: Patient identified, Emergency Drugs available, Suction available, Patient being monitored and Timeout performed Patient Re-evaluated:Patient Re-evaluated prior to induction Oxygen Delivery Method: Circle system utilized Preoxygenation: Pre-oxygenation with 100% oxygen Induction Type: IV induction Ventilation: Mask ventilation without difficulty Laryngoscope Size: McGraph and 3 Grade View: Grade I Tube type: Oral Tube size: 7.0 mm Number of attempts: 1 Airway Equipment and Method: Stylet Placement Confirmation: ETT inserted through vocal cords under direct vision,  positive ETCO2 and breath sounds checked- equal and bilateral Secured at: 21 cm Tube secured with: Tape Dental Injury: Teeth and Oropharynx as per pre-operative assessment

## 2016-11-18 NOTE — Anesthesia Preprocedure Evaluation (Signed)
Anesthesia Evaluation  Patient identified by MRN, date of birth, ID band Patient awake    Reviewed: Allergy & Precautions, NPO status , Patient's Chart, lab work & pertinent test results  History of Anesthesia Complications Negative for: history of anesthetic complications  Airway Mallampati: III       Dental   Pulmonary neg sleep apnea, neg COPD, former smoker,           Cardiovascular hypertension, Pt. on medications and Pt. on home beta blockers + CAD, + Past MI, + Cardiac Stents and +CHF  (-) dysrhythmias (-) Valvular Problems/Murmurs     Neuro/Psych Anxiety Depression Bipolar Disorder CVA (R sided weakness, numbness of r toes), Residual Symptoms    GI/Hepatic Neg liver ROS, PUD, GERD  Medicated and Controlled,  Endo/Other  diabetes, Type 2, Oral Hypoglycemic AgentsHypothyroidism   Renal/GU Renal InsufficiencyRenal disease     Musculoskeletal   Abdominal   Peds  Hematology   Anesthesia Other Findings   Reproductive/Obstetrics                             Anesthesia Physical Anesthesia Plan  ASA: III  Anesthesia Plan: General   Post-op Pain Management:    Induction: Intravenous  PONV Risk Score and Plan: 3 and Ondansetron, Dexamethasone, Midazolam and Treatment may vary due to age or medical condition  Airway Management Planned: Oral ETT  Additional Equipment:   Intra-op Plan:   Post-operative Plan:   Informed Consent: I have reviewed the patients History and Physical, chart, labs and discussed the procedure including the risks, benefits and alternatives for the proposed anesthesia with the patient or authorized representative who has indicated his/her understanding and acceptance.     Plan Discussed with:   Anesthesia Plan Comments:         Anesthesia Quick Evaluation

## 2016-11-18 NOTE — Op Note (Signed)
Preop diagnosis: Cholelithiasis and chronic cholecystitis  Post op diagnosis: Same  Operation: Laparoscopy cholecystectomy and intraoperative cholangiogram  Surgeon: Mckinley Jewel    Assistant:     Anesthesia: Gen.  Complications: None  EBL: Less than 20 mL  Drains: None  Description: Patient was put to sleep and the abdomen prepped and draped as sterile field. Timeout was performed. The patient had had previous bariatric surgery done via laparoscopy and the port incisions were at different locations outside of the normal ones used over cholecystectomy. Initial port site incision was made just above the umbilicus in a Veress needle with the InnerDyne sleeve was position the peritoneal cavity verified of the hanging drop method. Pneumoperitoneum was obtained and 10 mm port was placed. Camera was introduced and epigastric and 2 lateral 5 mm ports were placed. Gallbladder was noted be distended however with no acute changes. Few adhesions were noted in the midportion extending down to the duodenal area which was tented up. With careful traction cephalad and traction on the Hartman's pouch the adhesions were first freed of the duodenum was freed. Cystic duct was freed and the cystic artery identified in the normal anatomical position. Kumar clamp and catheter were positioned and cholangiogram performed which showed an extremely long narrow cystic duct. The common bile duct filled but only a small part of the proximal hepatic duct filled because of the distance. There was no apparent dilatation or filling defect in the common bile duct. The catheter was used to decompress the gallbladder and then removed. Cystic duct and the cystic artery were freed hemoclipped and cut separately. A second branch of the cystic artery was separately hemoclipped and cut. The gallbladder was dissected free from its bed using cautery for control of bleeding. The area was irrigated and all fluid suctioned out and hemostasis  was intact. Gallbladder was then brought out through the umbilical port site. The bile contain multiple tiny sound like stones. The fascial opening the umbilicus closed with 0 Vicryl stitch with the use of suture passer. All skin incisions were then closed with subcuticular 4-0 Vicryl reinforced with Steri-Strips and tincture benzoin. Spot Telfa and Tegaderm dressings were placed. No immediate problems were encountered and patient subsequently extubated and returned recovery room stable condition

## 2016-11-19 ENCOUNTER — Encounter: Payer: Self-pay | Admitting: General Surgery

## 2016-11-20 LAB — SURGICAL PATHOLOGY

## 2016-11-23 ENCOUNTER — Inpatient Hospital Stay: Payer: Medicare Other | Attending: Oncology

## 2016-11-25 ENCOUNTER — Ambulatory Visit (INDEPENDENT_AMBULATORY_CARE_PROVIDER_SITE_OTHER): Payer: Medicare Other | Admitting: General Surgery

## 2016-11-25 ENCOUNTER — Encounter: Payer: Self-pay | Admitting: General Surgery

## 2016-11-25 ENCOUNTER — Ambulatory Visit: Payer: Self-pay | Admitting: Family Medicine

## 2016-11-25 VITALS — BP 140/84 | HR 60 | Resp 14 | Ht 62.0 in | Wt 156.0 lb

## 2016-11-25 DIAGNOSIS — K8064 Calculus of gallbladder and bile duct with chronic cholecystitis without obstruction: Secondary | ICD-10-CM

## 2016-11-25 NOTE — Progress Notes (Deleted)
[place]  [phone] [phone]  ID: [name] OB: December 04, 1970  MR#: [id number]  RAQ#:[id number]  Patient Care Team: [name], MD as PCP - General (Family Medicine) Dagoberto Ligas, MD as Consulting Physician (Nephrology) Minna Merritts, MD as Consulting Physician (Cardiology) Vladimir Crofts, MD as Consulting Physician (Neurology) Irene Shipper, MD as Consulting Physician (Gastroenterology) Caryn Section Kirstie Peri, MD as Referring Physician (Family Medicine) Christene Lye, MD (General Surgery)  CHIEF COMPLAINT: Iron deficiency anemia.  INTERVAL HISTORY: Patient returns to clinic today for repeat laboratory work and further evaluation. She has chronic weakness and fatigue, but otherwise feels well.  She has no neurologic complaints. She denies any recent fevers or illnesses. She has a good appetite and denies weight loss. She denies any chest pain or shortness of breath. She denies any nausea, vomiting, constipation, or diarrhea. She has no melena or hematochezia. She has no urinary complaints. Patient offers no further specific complaints today.  REVIEW OF SYSTEMS:   Review of Systems  Constitutional: Positive for malaise/fatigue. Negative for fever and weight loss.  Respiratory: Negative.  Negative for cough and shortness of breath.   Cardiovascular: Negative.  Negative for chest pain, palpitations and leg swelling.  Gastrointestinal: Negative.  Negative for blood in stool and melena.  Genitourinary: Negative.   Musculoskeletal: Negative.   Neurological: Positive for weakness. Negative for sensory change.  Psychiatric/Behavioral: Negative.  The patient is not nervous/anxious.     As per HPI. Otherwise, a complete review of systems is negative.  PAST MEDICAL HISTORY: Past Medical History:  Diagnosis Date  . Anemia    iron deficiency anemia  . Anxiety   . Aortic arch aneurysm ([place])   . Arthritis   . Bipolar disorder  ([place])   . BRCA negative 2014  . CAD (coronary artery disease)    a. cath 08/2003 nonobs dzs;  b. 11/2014: mLAD 85-90% s/p PCI/DES (Xience 3.25 x 23); c. cath 04/2015: 70% mD1 @ bifurcation. Patent LAD stent - > Med Rx.; d. Cath-PCI 5/17: Patent LAD stent, D1 now ~80% - PCI Resolute DES 2.25 x 12.    . Chronic systolic CHF (congestive heart failure) ([place])    a. echo 01/29/2013 EF 40-45%, basal anteroseptal & mid anteroseptal segments abnl; b echo 05/2012 EF 50-55%, mildly dilated LA, nl RVSP; c. 10/2015 Echo: EF 55-60%, mild conc LVH, no rwma.  . CKD (chronic kidney disease), stage IV ([place])   . Colon polyp   . CVA (cerebral vascular accident) ([place])    a. 06/2008-TEE nl LV fxn; b. 01/2013 - notes indicate short run of SVT at that time  . Depression   . Diabetes mellitus without complication ([place])   . Family history of breast cancer    BRCA neg 2014  . Gastric ulcer 04/27/2011  . GERD (gastroesophageal reflux disease)   . Headache    Migraines  . Hyperlipidemia   . Hypertension   . Hypertensive heart disease   . Hypothyroidism   . Ischemic cardiomyopathy    a. EF prev 40-45% in 2014-->Improved to 55-60% (10/2015).  . Malignant melanoma of skin of scalp ([place])   . MI, acute, non ST segment elevation ([place])   . Morbid obesity ([place])    a. s/p gastric bypass.  . Multiple sclerosis ([place]) 2015  . Myocardial infarction ([place]) 06/03/2015  . Neuromuscular disorder ([place])   . Neuropathy   . Orthostatic hypotension   . S/P drug eluting coronary stent placement  06/04/2015  . Syncope     PAST SURGICAL HISTORY: Past Surgical History:  Procedure Laterality Date  . APPENDECTOMY    . CARDIAC CATHETERIZATION N/A 11/09/2014   Procedure: Coronary Angiography;  Surgeon: Minna Merritts, MD;  Location: [place] [facility];  Service: Cardiovascular;  Laterality: N/A;  . CARDIAC CATHETERIZATION N/A 11/12/2014   Procedure: Coronary Stent Intervention;  Surgeon: Isaias Cowman, MD;  Location: [place] [facility];  Service: Cardiovascular;  Laterality: N/A;  . CARDIAC CATHETERIZATION N/A 04/18/2015   Procedure: Left Heart Cath and Coronary Angiography;  Surgeon: Minna Merritts, MD;  Location: [facility];  Service: Cardiovascular;  Laterality: N/A;  . CARDIAC CATHETERIZATION Left 06/04/2015   Procedure: Left Heart Cath and Coronary Angiography;  Surgeon: Wellington Hampshire, MD;  Location: [place] [facility];  Service: Cardiovascular;  Laterality: Left;  . CARDIAC CATHETERIZATION N/A 06/04/2015   Procedure: Coronary Stent Intervention;  Surgeon: Wellington Hampshire, MD;  Location: [facility] [facility];  Service: Cardiovascular;  Laterality: N/A;  . CESAREAN SECTION  2001  . CHOLECYSTECTOMY N/A 11/18/2016   Procedure: LAPAROSCOPIC CHOLECYSTECTOMY WITH INTRAOPERATIVE CHOLANGIOGRAM;  Surgeon: Christene Lye, MD;  Location: [facility];  Service: General;  Laterality: N/A;  . COLONOSCOPY WITH PROPOFOL N/A 04/27/2016   Procedure: COLONOSCOPY WITH PROPOFOL;  Surgeon: [name], MD;  Location: [place];  Service: Endoscopy;  Laterality: N/A;  . CORONARY ANGIOPLASTY    . DILATION AND CURETTAGE OF UTERUS    . ESOPHAGOGASTRODUODENOSCOPY (EGD) WITH PROPOFOL N/A 09/14/2014   Procedure: ESOPHAGOGASTRODUODENOSCOPY (EGD) WITH PROPOFOL;  Surgeon: Josefine Class, MD;  Location[facility] END[facility];  Service: Endoscopy;  Laterality: N/A;  . ESOPHAGOGASTRODUODENOSCOPY (EGD) WITH PROPOFOL N/A 04/27/2016   Procedure: ESOPHAGOGASTRODUODENOSCOPY (EGD) WITH PROPOFOL;  Surgeon: [name], MD;  Location: [place];  Service: Endoscopy;  Laterality: N/A;  Diabetic - oral meds  . GASTRIC BYPASS  09/2009   [facility]   . Left Carotid to sublcavian artery bypass w/ subclavian artery ligation     a. Performed @ [place].  . MELANOMA EXCISION  2016   Dr. Evorn Gong  . [place]  2002  . RIGHT OOPHORECTOMY    . SHOULDER ARTHROSCOPY WITH OPEN ROTATOR CUFF REPAIR Right  01/07/2016   Procedure: SHOULDER ARTHROSCOPY WITH DEBRIDMENT, SUBACHROMIAL DECOMPRESSION;  Surgeon: Corky Mull, MD;  Location: [facility];  Service: Orthopedics;  Laterality: Right;  . TRIGGER FINGER RELEASE Right     [place]    FAMILY HISTORY Family History  Problem Relation Age of Onset  . Hypertension Mother   . Anxiety disorder Mother   . Depression Mother   . Bipolar disorder Mother   . Heart disease Mother   . Hyperlipidemia Mother   . Kidney disease Father   . Heart disease Father   . Hypertension Father   . Diabetes Father   . Stroke Father   . Colon cancer Father        dx in his 31's  . Anxiety disorder Father   . Depression Father   . Skin cancer Father   . Kidney disease Sister   . Thyroid nodules Sister   . Hypertension Sister   . Hypertension Sister   . Diabetes Sister   . Hyperlipidemia Sister   . Depression Sister   . Breast cancer Maternal Aunt 42  . Breast cancer Maternal Aunt 84  . Ovarian cancer Cousin   . Colon cancer Cousin   . Breast  cancer Other   . Kidney cancer Neg Hx   . Bladder Cancer Neg Hx        ADVANCED DIRECTIVES:    HEALTH MAINTENANCE: Social History  Substance Use Topics  . Smoking status: Former Smoker    Types: Cigarettes    Quit date: 08/31/1994  . Smokeless tobacco: Never Used     Comment: quit 28 years ago  . Alcohol use No     No Known Allergies  Current Outpatient Prescriptions  Medication Sig Dispense Refill  . ALPRAZolam (XANAX) 0.5 MG tablet TAKE 1/2 TO 1 TABLET BY MOUTH EVERY 6 HOURS AS NEEDED (Patient taking differently: 1 TABLET TWICE DAILY AS NEEDED FOR ANXIETY) 60 tablet 5  . aspirin EC 81 MG tablet Take 1 tablet (81 mg total) by mouth daily.    . citalopram (CELEXA) 40 MG tablet TAKE ONE TABLET BY MOUTH ONCE DAILY 30 tablet 5  . clopidogrel (PLAVIX) 75 MG tablet Take 1 tablet (75 mg total) by mouth daily. 30 tablet 11  . diltiazem (CARDIZEM CD) 120 MG 24 hr capsule Take 1 capsule (120 mg total) by  mouth daily. 90 capsule 3  . gabapentin (NEURONTIN) 300 MG capsule TAKE ONE CAPSULE BY MOUTH THREE TIMES DAILY (Patient taking differently: TAKE 2 CAPSULES IN THE MORNING AND 1 CAPSULE AT BEDTIME) 90 capsule 12  . isosorbide mononitrate (IMDUR) 60 MG 24 hr tablet Take 1 tablet (60 mg total) by mouth daily. 30 tablet 6  . levothyroxine (SYNTHROID, LEVOTHROID) 25 MCG tablet Take 1 tablet (25 mcg total) by mouth daily before breakfast. 30 tablet 12  . losartan (COZAAR) 50 MG tablet Take 1 tablet (50 mg total) by mouth daily. (Patient taking differently: Take 100 mg by mouth daily. ) 30 tablet 12  . metFORMIN (GLUCOPHAGE) 1000 MG tablet TAKE ONE TABLET BY MOUTH TWICE DAILY 60 tablet 12  . metoprolol succinate (TOPROL-XL) 25 MG 24 hr tablet Take 1 tablet (25 mg total) by mouth 2 (two) times daily. Take with or immediately following a meal. (Patient taking differently: Take 25 mg by mouth See admin instructions. Take with or immediately following a takes 1 tablet daily and another dose only if needed for tachycardia) 60 tablet 9  . naproxen sodium (ANAPROX) 220 MG tablet Take 440 mg by mouth daily as needed (PAIN).    Marland Kitchen nitroGLYCERIN (NITROSTAT) 0.4 MG SL tablet Place 1 tablet (0.4 mg total) under the tongue every 5 (five) minutes as needed for chest pain. 25 tablet 6  . pantoprazole (PROTONIX) 40 MG tablet Take 1 tablet (40 mg total) by mouth 2 (two) times daily. 60 tablet 5  . promethazine (PHENERGAN) 25 MG tablet Take 1 tablet (25 mg total) by mouth every 6 (six) hours as needed for nausea or vomiting. Reported on 05/31/2015 30 tablet 3  . tiZANidine (ZANAFLEX) 4 MG tablet Take 4 mg by mouth 2 (two) times daily as needed (migraines).    . traZODone (DESYREL) 100 MG tablet Take 2 tablets (200 mg total) by mouth at bedtime. 30 tablet 6  . zolpidem (AMBIEN) 5 MG tablet Take 1-2 tablets (5-10 mg total) by mouth at bedtime as needed for sleep. (Patient taking differently: Take 10 mg by mouth at bedtime. ) 40  tablet 3   No current facility-administered medications for this visit.     OBJECTIVE: There were no vitals filed for this visit.   There is no height or weight on file to calculate BMI.    ECOG FS:0 -  Asymptomatic  General: Well-developed, well-nourished, no acute distress. Eyes: Pink conjunctiva, anicteric sclera. Lungs: Clear to auscultation bilaterally. Heart: Regular rate and rhythm. No rubs, murmurs, or gallops. Abdomen: Soft, nontender, nondistended. No organomegaly noted, normoactive bowel sounds. Musculoskeletal: No edema, cyanosis, or clubbing. Neuro: Alert, answering all questions appropriately. Cranial nerves grossly intact. Skin: No rashes or petechiae noted. Psych: Normal affect.  LAB RESULTS:  Lab Results  Component Value Date   NA 136 10/14/2016   K 3.4 (L) 10/14/2016   CL 96 (L) 10/14/2016   CO2 27 10/14/2016   GLUCOSE 103 (H) 10/14/2016   BUN 14 10/14/2016   CREATININE 1.92 (H) 10/14/2016   CALCIUM 8.2 (L) 10/14/2016   PROT 7.0 10/14/2016   ALBUMIN 4.1 01/29/2016   AST 14 10/14/2016   ALT 10 10/14/2016   ALKPHOS 73 12/13/2015   BILITOT 0.4 10/14/2016   GFRNONAA 30 (L) 10/14/2016   GFRAA 35 (L) 10/14/2016    Lab Results  Component Value Date   WBC 10.3 10/14/2016   NEUTROABS 7.1 (H) 07/31/2016   HGB 12.7 10/14/2016   HCT 38.4 10/14/2016   MCV 82.9 10/14/2016   PLT 245 10/14/2016   Lab Results  Component Value Date   IRON 40 07/31/2016   TIBC 205 (L) 07/31/2016   IRONPCTSAT 20 07/31/2016    Lab Results  Component Value Date   FERRITIN 88 07/31/2016     STUDIES: Dg Cholangiogram Operative  Result Date: 11/18/2016 CLINICAL DATA:  Intraoperative cholangiogram during laparoscopic cholecystectomy. EXAM: INTRAOPERATIVE CHOLANGIOGRAM FLUOROSCOPY TIME:  36 seconds (9.6 mGy) COMPARISON:  Abdominal ultrasound - 10/20/2016 FINDINGS: Intraoperative cholangiographic images of the right upper abdominal quadrant during laparoscopic cholecystectomy  are provided for review. Surgical clips overlie the expected location of the gallbladder fossa. Contrast injection demonstrates selective cannulation of the central aspect of the cystic duct. There is passage of contrast through the central aspect of the cystic duct with faint opacification of a non dilated common bile duct. There is no definitive opacification of the duodenum. There are no discrete filling defects within the opacified portions of the biliary system to suggest the presence of choledocholithiasis. IMPRESSION: While there are no discrete filling defects within the opacified portions of the cystic or common bile ducts to suggest choledocholithiasis, there is no definitive opacification of the duodenum. Correlation with the operative report is recommended. Electronically Signed   By: Sandi Mariscal M.D.   On: 11/18/2016 14:47    ASSESSMENT: Iron deficiency anemia.  PLAN:    1. Iron deficiency anemia: Possibly secondary to poor absorption given patient's history of gastric bypass surgery. Patient's hemoglobin and iron stores continue to be within normal limits. Previous, her entire anemia workup was also either negative or within normal limits. Patient does not take oral iron.  No IV feraheme needed today. Patient last received IV iron in March 2017. Patient will return to clinic in 4 months with repeat laboratory work and further evaluation. If patient's lab work continues to be within normal limits at that time, can consider discharging from clinic. 2. Hypertension: Patient's blood pressure improved. Continue monitoring and treatment per primary care. 3. Chronic renal insufficiency: Patient's creatinine appears to be at her baseline. Continue monitoring treatment per nephrology.   Patient expressed understanding and was in agreement with this plan. She also understands that She can call clinic at any time with any questions, concerns, or complaints.   Lloyd Huger, MD   11/25/2016 1:09  PM

## 2016-11-25 NOTE — Patient Instructions (Addendum)
The patient is aware to call back for any questions or concerns. Follow up as needed. Resume activities as tolerated. No dietary restrictions.

## 2016-11-25 NOTE — Progress Notes (Signed)
Patient ID: [name], female   DOB: 16-Dec-1970, 46 y.o.   MRN: [id number]  Chief Complaint  Patient presents with  . Routine Post Op    HPI [name] [name] is a 46 y.o. female.  Here today for postoperative visit, lap cholecystectomy on 11-18-16, she states she is doing well, still quite sore around her umbilical region. Denies any gastrointestinal issues, bowels are moving regularly. She states her appetite is fair.  HPI  Past Medical History:  Diagnosis Date  . Anemia    iron deficiency anemia  . Anxiety   . Aortic arch aneurysm ([facility])   . Arthritis   . Bipolar disorder ([facility])   . BRCA negative 2014  . CAD (coronary artery disease)    a. cath 08/2003 nonobs dzs;  b. 11/2014: mLAD 85-90% s/p PCI/DES (Xience 3.25 x 23); c. cath 04/2015: 70% mD1 @ bifurcation. Patent LAD stent - > Med Rx.; d. Cath-PCI 5/17: Patent LAD stent, D1 now ~80% - PCI Resolute DES 2.25 x 12.    . Chronic systolic CHF (congestive heart failure) ([facility])    a. echo 01/29/2013 EF 40-45%, basal anteroseptal & mid anteroseptal segments abnl; b echo 05/2012 EF 50-55%, mildly dilated LA, nl RVSP; c. 10/2015 Echo: EF 55-60%, mild conc LVH, no rwma.  . CKD (chronic kidney disease), stage IV ([facility])   . Colon polyp   . CVA (cerebral vascular accident) ([facility])    a. 06/2008-TEE nl LV fxn; b. 01/2013 - notes indicate short run of SVT at that time  . Depression   . Diabetes mellitus without complication ([facility])   . Family history of breast cancer    BRCA neg 2014  . Gastric ulcer 04/27/2011  . GERD (gastroesophageal reflux disease)   . Headache    Migraines  . Hyperlipidemia   . Hypertension   . Hypertensive heart disease   . Hypothyroidism   . Ischemic cardiomyopathy    a. EF prev 40-45% in 2014-->Improved to 55-60% (10/2015).  . Malignant melanoma of skin of scalp ([facility])   . MI, acute, non ST segment elevation ([facility])   . Morbid obesity ([facility])    a. s/p gastric bypass.  . Multiple sclerosis ([facility]) 2015  . Myocardial  infarction ([facility]) 06/03/2015  . Neuromuscular disorder ([facility])   . Neuropathy   . Orthostatic hypotension   . S/P drug eluting coronary stent placement 06/04/2015  . Syncope     Past Surgical History:  Procedure Laterality Date  . APPENDECTOMY    . CARDIAC CATHETERIZATION N/A 11/09/2014   Procedure: Coronary Angiography;  Surgeon: Antonieta Iba, MD;  Location: [facility];  Service: Cardiovascular;  Laterality: N/A;  . CARDIAC CATHETERIZATION N/A 11/12/2014   Procedure: Coronary Stent Intervention;  Surgeon: Marcina Millard, MD;  Location: [facility];  Service: Cardiovascular;  Laterality: N/A;  . CARDIAC CATHETERIZATION N/A 04/18/2015   Procedure: Left Heart Cath and Coronary Angiography;  Surgeon: Antonieta Iba, MD;  Location: [facility];  Service: Cardiovascular;  Laterality: N/A;  . CARDIAC CATHETERIZATION Left 06/04/2015   Procedure: Left Heart Cath and Coronary Angiography;  Surgeon: Iran Ouch, MD;  Location: [facility];  Service: Cardiovascular;  Laterality: Left;  . CARDIAC CATHETERIZATION N/A 06/04/2015   Procedure: Coronary Stent Intervention;  Surgeon: Iran Ouch, MD;  Location: [facility];  Service: Cardiovascular;  Laterality: N/A;  . CESAREAN SECTION  2001  . CHOLECYSTECTOMY N/A 11/18/2016   Procedure:  LAPAROSCOPIC CHOLECYSTECTOMY WITH INTRAOPERATIVE CHOLANGIOGRAM;  Surgeon: Kieth Brightly, MD;  Location: [facility];  Service: General;  Laterality: N/A;  . COLONOSCOPY WITH PROPOFOL N/A 04/27/2016   Procedure: COLONOSCOPY WITH PROPOFOL;  Surgeon: Midge Minium, MD;  Location: [facility];  Service: Endoscopy;  Laterality: N/A;  . CORONARY ANGIOPLASTY    . DILATION AND CURETTAGE OF UTERUS    . ESOPHAGOGASTRODUODENOSCOPY (EGD) WITH PROPOFOL N/A 09/14/2014   Procedure: ESOPHAGOGASTRODUODENOSCOPY (EGD) WITH PROPOFOL;  Surgeon: Elnita Maxwell, MD;  Location: [facility] ENDOSCOPY;  Service: Endoscopy;   Laterality: N/A;  . ESOPHAGOGASTRODUODENOSCOPY (EGD) WITH PROPOFOL N/A 04/27/2016   Procedure: ESOPHAGOGASTRODUODENOSCOPY (EGD) WITH PROPOFOL;  Surgeon: Midge Minium, MD;  Location: [facility];  Service: Endoscopy;  Laterality: N/A;  Diabetic - oral meds  . GASTRIC BYPASS  09/2009   [facility]   . Left Carotid to sublcavian artery bypass w/ subclavian artery ligation     a. Performed @ [place].  . MELANOMA EXCISION  2016   Dr. Adolphus Birchwood  . NOVASURE ABLATION  2002  . RIGHT OOPHORECTOMY    . SHOULDER ARTHROSCOPY WITH OPEN ROTATOR CUFF REPAIR Right 01/07/2016   Procedure: SHOULDER ARTHROSCOPY WITH DEBRIDMENT, SUBACHROMIAL DECOMPRESSION;  Surgeon: Christena Flake, MD;  Location: [facility];  Service: Orthopedics;  Laterality: Right;  . TRIGGER FINGER RELEASE Right     [place]    Family History  Problem Relation Age of Onset  . Hypertension Mother   . Anxiety disorder Mother   . Depression Mother   . Bipolar disorder Mother   . Heart disease Mother   . Hyperlipidemia Mother   . Kidney disease Father   . Heart disease Father   . Hypertension Father   . Diabetes Father   . Stroke Father   . Colon cancer Father        dx in his 11's  . Anxiety disorder Father   . Depression Father   . Skin cancer Father   . Kidney disease Sister   . Thyroid nodules Sister   . Hypertension Sister   . Hypertension Sister   . Diabetes Sister   . Hyperlipidemia Sister   . Depression Sister   . Breast cancer Maternal Aunt 50  . Breast cancer Maternal Aunt 50  . Ovarian cancer Cousin   . Colon cancer Cousin   . Breast cancer Other   . Kidney cancer Neg Hx   . Bladder Cancer Neg Hx     Social History Social History  Substance Use Topics  . Smoking status: Former Smoker    Types: Cigarettes    Quit date: 08/31/1994  . Smokeless tobacco: Never Used     Comment: quit 28 years ago  . Alcohol use No    No Known Allergies  Current Outpatient Prescriptions  Medication  Sig Dispense Refill  . ALPRAZolam (XANAX) 0.5 MG tablet TAKE 1/2 TO 1 TABLET BY MOUTH EVERY 6 HOURS AS NEEDED (Patient taking differently: 1 TABLET TWICE DAILY AS NEEDED FOR ANXIETY) 60 tablet 5  . aspirin EC 81 MG tablet Take 1 tablet (81 mg total) by mouth daily.    . citalopram (CELEXA) 40 MG tablet TAKE ONE TABLET BY MOUTH ONCE DAILY 30 tablet 5  . clopidogrel (PLAVIX) 75 MG tablet Take 1 tablet (75 mg total) by mouth daily. 30 tablet 11  . diltiazem (CARDIZEM CD) 120 MG 24 hr capsule Take 1 capsule (120 mg total) by mouth daily. 90 capsule 3  . gabapentin (NEURONTIN)  300 MG capsule TAKE ONE CAPSULE BY MOUTH THREE TIMES DAILY (Patient taking differently: TAKE 2 CAPSULES IN THE MORNING AND 1 CAPSULE AT BEDTIME) 90 capsule 12  . isosorbide mononitrate (IMDUR) 60 MG 24 hr tablet Take 1 tablet (60 mg total) by mouth daily. 30 tablet 6  . levothyroxine (SYNTHROID, LEVOTHROID) 25 MCG tablet Take 1 tablet (25 mcg total) by mouth daily before breakfast. 30 tablet 12  . losartan (COZAAR) 50 MG tablet Take 1 tablet (50 mg total) by mouth daily. (Patient taking differently: Take 100 mg by mouth daily. ) 30 tablet 12  . metFORMIN (GLUCOPHAGE) 1000 MG tablet TAKE ONE TABLET BY MOUTH TWICE DAILY 60 tablet 12  . metoprolol succinate (TOPROL-XL) 25 MG 24 hr tablet Take 1 tablet (25 mg total) by mouth 2 (two) times daily. Take with or immediately following a meal. (Patient taking differently: Take 25 mg by mouth See admin instructions. Take with or immediately following a takes 1 tablet daily and another dose only if needed for tachycardia) 60 tablet 9  . naproxen sodium (ANAPROX) 220 MG tablet Take 440 mg by mouth daily as needed (PAIN).    Marland Kitchen nitroGLYCERIN (NITROSTAT) 0.4 MG SL tablet Place 1 tablet (0.4 mg total) under the tongue every 5 (five) minutes as needed for chest pain. 25 tablet 6  . pantoprazole (PROTONIX) 40 MG tablet Take 1 tablet (40 mg total) by mouth 2 (two) times daily. 60 tablet 5  .  promethazine (PHENERGAN) 25 MG tablet Take 1 tablet (25 mg total) by mouth every 6 (six) hours as needed for nausea or vomiting. Reported on 05/31/2015 30 tablet 3  . tiZANidine (ZANAFLEX) 4 MG tablet Take 4 mg by mouth 2 (two) times daily as needed (migraines).    . traZODone (DESYREL) 100 MG tablet Take 2 tablets (200 mg total) by mouth at bedtime. 30 tablet 6  . zolpidem (AMBIEN) 5 MG tablet Take 1-2 tablets (5-10 mg total) by mouth at bedtime as needed for sleep. (Patient taking differently: Take 10 mg by mouth at bedtime. ) 40 tablet 3   No current facility-administered medications for this visit.     Review of Systems Review of Systems  Constitutional: Negative.   Respiratory: Negative.   Cardiovascular: Negative.     Blood pressure 140/84, pulse 60, resp. rate 14, height 5\' 2"  (1.575 m), weight 156 lb (70.8 kg), SpO2 100 %.  Physical Exam Physical Exam  Constitutional: She is oriented to person, place, and time. She appears well-developed and well-nourished.  Abdominal: Soft. Bowel sounds are normal. There is no tenderness.  Neurological: She is alert and oriented to person, place, and time.  Skin: Skin is warm and dry. Erythema: Incision and port sites well healed.  Psychiatric: Her behavior is normal.    Data Reviewed Prior notes Pathology- consistent with diagnosis, gallbladder with cholelithiasis and chronic cholecystitis.    Assessment     Lap cholecystectomy 11-18-16. Stable postoperative course.   Plan    Follow up as needed. Begin to resume activities as tolerated. No dietary restrictions. The patient is aware to call back for any questions or new concerns.    HPI, Physical Exam, Assessment and Plan have been scribed under the direction and in the presence of Kathreen Cosier, MD Dorathy Daft, RN   I have completed the exam and reviewed the above documentation for accuracy and completeness.  I agree with the above.  Museum/gallery conservator has been used and any errors  in dictation or  transcription are unintentional.  Roxy Filler G. Evette Cristal, M.D., F.A.C.S.  Gerlene Burdock G 11/25/2016, 12:26 PM

## 2016-11-25 NOTE — Progress Notes (Deleted)
Patient: [name] Female    DOB: [birth year]   46 y.o.   MRN: [id number] Visit Date: 11/25/2016  Today's Provider: [name], MD   No chief complaint on file.  Subjective:    HPI     No Known Allergies   Current Outpatient Prescriptions:  .  ALPRAZolam (XANAX) 0.5 MG tablet, TAKE 1/2 TO 1 TABLET BY MOUTH EVERY 6 HOURS AS NEEDED (Patient taking differently: 1 TABLET TWICE DAILY AS NEEDED FOR ANXIETY), Disp: 60 tablet, Rfl: 5 .  aspirin EC 81 MG tablet, Take 1 tablet (81 mg total) by mouth daily., Disp: , Rfl:  .  citalopram (CELEXA) 40 MG tablet, TAKE ONE TABLET BY MOUTH ONCE DAILY, Disp: 30 tablet, Rfl: 5 .  clopidogrel (PLAVIX) 75 MG tablet, Take 1 tablet (75 mg total) by mouth daily., Disp: 30 tablet, Rfl: 11 .  diltiazem (CARDIZEM CD) 120 MG 24 hr capsule, Take 1 capsule (120 mg total) by mouth daily., Disp: 90 capsule, Rfl: 3 .  gabapentin (NEURONTIN) 300 MG capsule, TAKE ONE CAPSULE BY MOUTH THREE TIMES DAILY (Patient taking differently: TAKE 2 CAPSULES IN THE MORNING AND 1 CAPSULE AT BEDTIME), Disp: 90 capsule, Rfl: 12 .  isosorbide mononitrate (IMDUR) 60 MG 24 hr tablet, Take 1 tablet (60 mg total) by mouth daily., Disp: 30 tablet, Rfl: 6 .  levothyroxine (SYNTHROID, LEVOTHROID) 25 MCG tablet, Take 1 tablet (25 mcg total) by mouth daily before breakfast., Disp: 30 tablet, Rfl: 12 .  losartan (COZAAR) 50 MG tablet, Take 1 tablet (50 mg total) by mouth daily. (Patient taking differently: Take 100 mg by mouth daily. ), Disp: 30 tablet, Rfl: 12 .  metFORMIN (GLUCOPHAGE) 1000 MG tablet, TAKE ONE TABLET BY MOUTH TWICE DAILY, Disp: 60 tablet, Rfl: 12 .  metoprolol succinate (TOPROL-XL) 25 MG 24 hr tablet, Take 1 tablet (25 mg total) by mouth 2 (two) times daily. Take with or immediately following a meal. (Patient taking differently: Take 25 mg by mouth See admin instructions. Take with or immediately following a takes 1 tablet daily and another dose only if needed for  tachycardia), Disp: 60 tablet, Rfl: 9 .  naproxen sodium (ANAPROX) 220 MG tablet, Take 440 mg by mouth daily as needed (PAIN)., Disp: , Rfl:  .  nitroGLYCERIN (NITROSTAT) 0.4 MG SL tablet, Place 1 tablet (0.4 mg total) under the tongue every 5 (five) minutes as needed for chest pain. (Patient not taking: Reported on 11/18/2016), Disp: 25 tablet, Rfl: 6 .  oxyCODONE (ROXICODONE) 5 MG immediate release tablet, Take 1 tablet (5 mg total) by mouth every 4 (four) hours as needed., Disp: 20 tablet, Rfl: 0 .  pantoprazole (PROTONIX) 40 MG tablet, Take 1 tablet (40 mg total) by mouth 2 (two) times daily., Disp: 60 tablet, Rfl: 5 .  promethazine (PHENERGAN) 25 MG tablet, Take 1 tablet (25 mg total) by mouth every 6 (six) hours as needed for nausea or vomiting. Reported on 05/31/2015, Disp: 30 tablet, Rfl: 3 .  tiZANidine (ZANAFLEX) 4 MG tablet, Take 4 mg by mouth 2 (two) times daily as needed (migraines)., Disp: , Rfl:  .  traZODone (DESYREL) 100 MG tablet, Take 2 tablets (200 mg total) by mouth at bedtime., Disp: 30 tablet, Rfl: 6 .  zolpidem (AMBIEN) 5 MG tablet, Take 1-2 tablets (5-10 mg total) by mouth at bedtime as needed for sleep. (Patient taking differently: Take 10 mg by mouth at bedtime. ), Disp: 40 tablet, Rfl: 3  Review of Systems  Constitutional: Negative for appetite change, chills, fatigue and fever.  Respiratory: Negative for chest tightness and shortness of breath.   Cardiovascular: Negative for chest pain and palpitations.  Gastrointestinal: Negative for abdominal pain, nausea and vomiting.  Neurological: Negative for dizziness and weakness.    Social History  Substance Use Topics  . Smoking status: Former Smoker    Types: Cigarettes    Quit date: 08/31/1994  . Smokeless tobacco: Never Used     Comment: quit 28 years ago  . Alcohol use No   Objective:   There were no vitals taken for this visit. There were no vitals filed for this visit.   Physical Exam      Assessment &  Plan:           [name], MD  [facility]

## 2016-11-26 ENCOUNTER — Inpatient Hospital Stay: Payer: Medicare Other

## 2016-11-27 ENCOUNTER — Inpatient Hospital Stay: Payer: Medicare Other

## 2016-11-27 ENCOUNTER — Other Ambulatory Visit: Payer: Self-pay | Admitting: Family Medicine

## 2016-11-27 ENCOUNTER — Inpatient Hospital Stay: Payer: Medicare Other | Admitting: Oncology

## 2016-11-27 NOTE — Telephone Encounter (Signed)
Pt need refill sent to [facility] for citalopram 40 mg.  States pharmacy said it was sent over yesterday.  States she will be out of the medication tomorrow.

## 2016-11-28 MED ORDER — ZOLPIDEM TARTRATE 5 MG PO TABS: 5.0000 mg | ORAL_TABLET | Freq: Every evening | ORAL | 3 refills | Status: DC | PRN

## 2016-11-28 NOTE — Telephone Encounter (Signed)
Please call in zolpidem  

## 2016-12-02 ENCOUNTER — Other Ambulatory Visit: Payer: Self-pay | Admitting: Family Medicine

## 2016-12-02 NOTE — Telephone Encounter (Signed)
Pt contacted office for refill request on the following medications:  citalopram (CELEXA) 40 MG tablet  Ong.  CB#[phone]/MW

## 2016-12-03 ENCOUNTER — Ambulatory Visit: Payer: Self-pay | Admitting: Family Medicine

## 2016-12-03 MED ORDER — CITALOPRAM HYDROBROMIDE 40 MG PO TABS: 40.0000 mg | ORAL_TABLET | Freq: Every day | ORAL | 0 refills | Status: DC

## 2016-12-03 NOTE — Progress Notes (Deleted)
Patient: [name] Female    DOB: [birth year]   46 y.o.   MRN: [id number] Visit Date: 12/03/2016  Today's Provider: [name], MD   No chief complaint on file.  Subjective:    HPI   Diabetes Mellitus Type II, Follow-up:   Lab Results  Component Value Date   HGBA1C 5.4 05/18/2016   HGBA1C 5.4 12/30/2015   HGBA1C 7.3 08/14/2015   Last seen for diabetes 1 months ago.  Management since then includes; no changes. She reports {excellent/good/fair/poor:19665} compliance with treatment. She {ACTION; IS/IS VHQ:46962952} having side effects. *** Current symptoms include {Symptoms; diabetes:14075} and have been {Desc; course:15616}. Home blood sugar records: {diabetes glucometry results:16657}  Episodes of hypoglycemia? {yes***/no:17258}   Current Insulin Regimen: *** Most Recent Eye Exam: *** Weight trend: {trend:16658} Prior visit with dietician: {yes/no:17258} Current diet: {diet habits:16563} Current exercise: {exercise types:16438}  ------------------------------------------------------------------------   Hypertension, follow-up:  BP Readings from Last 3 Encounters:  11/25/16 140/84  11/10/16 (!) 165/85  10/26/16 118/72    She was last seen for hypertension 6 months ago.  BP at that visit was 112/66. Management since that visit includes; no changes.She reports {excellent/good/fair/poor:19665} compliance with treatment. She {ACTION; IS/IS WUX:32440102} having side effects. *** She {is/is not:9024} exercising. She {is/is not:9024} adherent to low salt diet.   Outside blood pressures are ***. She is experiencing {Symptoms; cardiac:12860}.  Patient denies {Symptoms; cardiac:12860}.   Cardiovascular risk factors include {cv risk factors:510}.  Use of agents associated with hypertension: {bp agents assoc with hypertension:511::"none"}.   ------------------------------------------------------------------------    Lipid/Cholesterol, Follow-up:    Last seen for this 6 months ago.  Management since that visit includes; labs checked. Added Zetia 10 mg qd. Will recheck cholesterol at next follow-up.  Last Lipid Panel:    Component Value Date/Time   CHOL 159 05/28/2016 1212   CHOL 197 12/30/2013 0355   TRIG 148 05/28/2016 1212   TRIG 261 (H) 12/30/2013 0355   HDL 34 (L) 05/28/2016 1212   HDL 28 (L) 12/30/2013 0355   CHOLHDL 4.7 (H) 05/28/2016 1212   CHOLHDL 6.1 11/09/2014 0454   VLDL 52 (H) 11/09/2014 0454   VLDL 52 (H) 12/30/2013 0355   LDLCALC 95 05/28/2016 1212   LDLCALC 117 (H) 12/30/2013 0355   LDLDIRECT 83.3 01/26/2011 0913    She reports {excellent/good/fair/poor:19665} compliance with treatment. She {ACTION; IS/IS VOZ:[id number]} having side effects. ***  Wt Readings from Last 3 Encounters:  11/25/16 156 lb (70.8 kg)  11/10/16 159 lb (72.1 kg)  10/26/16 159 lb (72.1 kg)    ------------------------------------------------------------------------  Situational stress From 06/05/2016-no changes. Continue citalopram for now.   Adjustment insomnia From 06/05/2016-started ALPRAZolam (XANAX) 0.5 MG tablet for anxiety and sleep. Call if symptoms change or if not rapidly improving.    No Known Allergies   Current Outpatient Prescriptions:  .  ALPRAZolam (XANAX) 0.5 MG tablet, TAKE 1/2 TO 1 TABLET BY MOUTH EVERY 6 HOURS AS NEEDED (Patient taking differently: 1 TABLET TWICE DAILY AS NEEDED FOR ANXIETY), Disp: 60 tablet, Rfl: 5 .  aspirin EC 81 MG tablet, Take 1 tablet (81 mg total) by mouth daily., Disp: , Rfl:  .  citalopram (CELEXA) 40 MG tablet, TAKE ONE TABLET BY MOUTH ONCE DAILY, Disp: 30 tablet, Rfl: 5 .  clopidogrel (PLAVIX) 75 MG tablet, Take 1 tablet (75 mg total) by mouth daily., Disp: 30 tablet, Rfl: 11 .  diltiazem (CARDIZEM CD) 120 MG 24 hr capsule, Take 1 capsule (120  mg total) by mouth daily., Disp: 90 capsule, Rfl: 3 .  gabapentin (NEURONTIN) 300 MG capsule, TAKE ONE CAPSULE BY MOUTH THREE TIMES DAILY  (Patient taking differently: TAKE 2 CAPSULES IN THE MORNING AND 1 CAPSULE AT BEDTIME), Disp: 90 capsule, Rfl: 12 .  isosorbide mononitrate (IMDUR) 60 MG 24 hr tablet, Take 1 tablet (60 mg total) by mouth daily., Disp: 30 tablet, Rfl: 6 .  levothyroxine (SYNTHROID, LEVOTHROID) 25 MCG tablet, Take 1 tablet (25 mcg total) by mouth daily before breakfast., Disp: 30 tablet, Rfl: 12 .  losartan (COZAAR) 50 MG tablet, Take 1 tablet (50 mg total) by mouth daily. (Patient taking differently: Take 100 mg by mouth daily. ), Disp: 30 tablet, Rfl: 12 .  metFORMIN (GLUCOPHAGE) 1000 MG tablet, TAKE ONE TABLET BY MOUTH TWICE DAILY, Disp: 60 tablet, Rfl: 12 .  metoprolol succinate (TOPROL-XL) 25 MG 24 hr tablet, Take 1 tablet (25 mg total) by mouth 2 (two) times daily. Take with or immediately following a meal. (Patient taking differently: Take 25 mg by mouth See admin instructions. Take with or immediately following a takes 1 tablet daily and another dose only if needed for tachycardia), Disp: 60 tablet, Rfl: 9 .  naproxen sodium (ANAPROX) 220 MG tablet, Take 440 mg by mouth daily as needed (PAIN)., Disp: , Rfl:  .  nitroGLYCERIN (NITROSTAT) 0.4 MG SL tablet, Place 1 tablet (0.4 mg total) under the tongue every 5 (five) minutes as needed for chest pain., Disp: 25 tablet, Rfl: 6 .  pantoprazole (PROTONIX) 40 MG tablet, Take 1 tablet (40 mg total) by mouth 2 (two) times daily., Disp: 60 tablet, Rfl: 5 .  promethazine (PHENERGAN) 25 MG tablet, Take 1 tablet (25 mg total) by mouth every 6 (six) hours as needed for nausea or vomiting. Reported on 05/31/2015, Disp: 30 tablet, Rfl: 3 .  tiZANidine (ZANAFLEX) 4 MG tablet, Take 4 mg by mouth 2 (two) times daily as needed (migraines)., Disp: , Rfl:  .  traZODone (DESYREL) 100 MG tablet, Take 2 tablets (200 mg total) by mouth at bedtime., Disp: 30 tablet, Rfl: 6 .  zolpidem (AMBIEN) 5 MG tablet, Take 1-2 tablets (5-10 mg total) by mouth at bedtime as needed for sleep., Disp: 40  tablet, Rfl: 3  Review of Systems  Constitutional: Negative for appetite change, chills, fatigue and fever.  Respiratory: Negative for chest tightness and shortness of breath.   Cardiovascular: Negative for chest pain and palpitations.  Gastrointestinal: Negative for abdominal pain, nausea and vomiting.  Neurological: Negative for dizziness and weakness.    Social History  Substance Use Topics  . Smoking status: Former Smoker    Types: Cigarettes    Quit date: 08/31/1994  . Smokeless tobacco: Never Used     Comment: quit 28 years ago  . Alcohol use No   Objective:   There were no vitals taken for this visit. There were no vitals filed for this visit.   Physical Exam      Assessment & Plan:           [name], MD  [facility]

## 2016-12-07 ENCOUNTER — Ambulatory Visit: Payer: Self-pay | Admitting: Family Medicine

## 2016-12-08 ENCOUNTER — Ambulatory Visit (INDEPENDENT_AMBULATORY_CARE_PROVIDER_SITE_OTHER): Payer: Medicare Other | Admitting: Family Medicine

## 2016-12-08 ENCOUNTER — Encounter: Payer: Self-pay | Admitting: Family Medicine

## 2016-12-08 VITALS — BP 140/90 | HR 79 | Temp 98.7°F | Resp 16 | Wt 160.0 lb

## 2016-12-08 DIAGNOSIS — I10 Essential (primary) hypertension: Secondary | ICD-10-CM | POA: Diagnosis not present

## 2016-12-08 DIAGNOSIS — E039 Hypothyroidism, unspecified: Secondary | ICD-10-CM

## 2016-12-08 DIAGNOSIS — E7800 Pure hypercholesterolemia, unspecified: Secondary | ICD-10-CM

## 2016-12-08 DIAGNOSIS — I2510 Atherosclerotic heart disease of native coronary artery without angina pectoris: Secondary | ICD-10-CM

## 2016-12-08 DIAGNOSIS — E118 Type 2 diabetes mellitus with unspecified complications: Secondary | ICD-10-CM

## 2016-12-08 NOTE — Progress Notes (Signed)
Patient: [name] Female    DOB: [birth year]   47 y.o.   MRN: [id number] Visit Date: 12/08/2016  Today's Provider: [name], MD   Chief Complaint  Patient presents with  . Diabetes    follow up  . Hypertension    follow up  . Insomnia    follow up  . Hyperlipidemia    follow up   Subjective:    HPI   Diabetes Mellitus Type II, Follow-up:   Lab Results  Component Value Date   HGBA1C 5.4 05/18/2016   HGBA1C 5.4 12/30/2015   HGBA1C 7.3 08/14/2015   Last seen for diabetes 7 months ago.  Management since then includes; no changes. She reports good compliance with treatment. She is not having side effects.  Current symptoms include paresthesia of the feet, polydipsia and visual disturbances and have been stable. Home blood sugar records: patient has not been checking blood sugars regularly  Episodes of hypoglycemia? no   Current Insulin Regimen: none Most Recent Eye Exam: 2 year ago Weight trend: stable Prior visit with dietician: no Current diet: in general, a "healthy" diet   Current exercise: none  ------------------------------------------------------------------------   Hypertension, follow-up:  BP Readings from Last 3 Encounters:  11/25/16 140/84  11/10/16 (!) 165/85  10/26/16 118/72    She was last seen for hypertension 7 months ago.  BP at that visit was 112/66. Management since that visit includes; no changes.She reports good compliance with treatment. She is not having side effects.  She is not exercising. She is adherent to low salt diet.   Outside blood pressures are not being checked. She is experiencing chest pain.  Patient denies chest pressure/discomfort, claudication, dyspnea, exertional chest pressure/discomfort, fatigue, irregular heart beat, lower extremity edema, near-syncope, orthopnea, palpitations, paroxysmal nocturnal dyspnea, syncope and tachypnea.   Cardiovascular risk factors include diabetes mellitus,  dyslipidemia and hypertension.  Use of agents associated with hypertension: NSAIDS.   ------------------------------------------------------------------------  Lipid/Cholesterol, Follow-up:   Last seen for this 6 months ago.  Management changes since that visit include adding Zetia 10mg  daily. . Last Lipid Panel:    Component Value Date/Time   CHOL 159 05/28/2016 1212   CHOL 197 12/30/2013 0355   TRIG 148 05/28/2016 1212   TRIG 261 (H) 12/30/2013 0355   HDL 34 (L) 05/28/2016 1212   HDL 28 (L) 12/30/2013 0355   CHOLHDL 4.7 (H) 05/28/2016 1212   CHOLHDL 6.1 11/09/2014 0454   VLDL 52 (H) 11/09/2014 0454   VLDL 52 (H) 12/30/2013 0355   LDLCALC 95 05/28/2016 1212   LDLCALC 117 (H) 12/30/2013 0355   LDLDIRECT 83.3 01/26/2011 0913    Risk factors for vascular disease include diabetes mellitus, hypercholesterolemia and hypertension  She reports good compliance with treatment. She is not having side effects.  Current symptoms include paresthesia of the feet, polydipsia and visual disturbances and have been stable. Weight trend: stable Prior visit with dietician: no Current diet: in general, a "healthy" diet   Current exercise: none  Wt Readings from Last 3 Encounters:  12/08/16 160 lb (72.6 kg)  11/25/16 156 lb (70.8 kg)  11/10/16 159 lb (72.1 kg)    ------------------------------------------------------------------- Follow up of Situational Insomnia:  Patient was last seen for this problem 6 months ago. Changes made during that visit includes starting Xanax as needed for Anxiety and sleep. Patient reports good compliance with treatment, good tolerance and good symptom control.   Follow up of Situational stress:  Patient  was last seen for this problem 6 months ago and no changes were made. Patient reports good compliance with treatment, good tolerance and good symptom control.  Patient states she has been out of Citalopram for 1-2 weeks and needs refills.    No Known  Allergies   Current Outpatient Medications:  .  ezetimibe (ZETIA) 10 MG tablet, Take 10 mg daily by mouth., Disp: , Rfl:  .  ALPRAZolam (XANAX) 0.5 MG tablet, TAKE 1/2 TO 1 TABLET BY MOUTH EVERY 6 HOURS AS NEEDED (Patient taking differently: 1 TABLET TWICE DAILY AS NEEDED FOR ANXIETY), Disp: 60 tablet, Rfl: 5 .  aspirin EC 81 MG tablet, Take 1 tablet (81 mg total) by mouth daily., Disp: , Rfl:  .  citalopram (CELEXA) 40 MG tablet, Take 1 tablet (40 mg total) by mouth daily., Disp: 30 tablet, Rfl: 0 .  clopidogrel (PLAVIX) 75 MG tablet, Take 1 tablet (75 mg total) by mouth daily., Disp: 30 tablet, Rfl: 11 .  diltiazem (CARDIZEM CD) 120 MG 24 hr capsule, Take 1 capsule (120 mg total) by mouth daily., Disp: 90 capsule, Rfl: 3 .  gabapentin (NEURONTIN) 300 MG capsule, TAKE ONE CAPSULE BY MOUTH THREE TIMES DAILY (Patient taking differently: TAKE 2 CAPSULES IN THE MORNING AND 1 CAPSULE AT BEDTIME), Disp: 90 capsule, Rfl: 12 .  isosorbide mononitrate (IMDUR) 60 MG 24 hr tablet, Take 1 tablet (60 mg total) by mouth daily., Disp: 30 tablet, Rfl: 6 .  levothyroxine (SYNTHROID, LEVOTHROID) 25 MCG tablet, Take 1 tablet (25 mcg total) by mouth daily before breakfast., Disp: 30 tablet, Rfl: 12 .  losartan (COZAAR) 50 MG tablet, Take 1 tablet (50 mg total) by mouth daily. (Patient taking differently: Take 100 mg by mouth daily. ), Disp: 30 tablet, Rfl: 12 .  metFORMIN (GLUCOPHAGE) 1000 MG tablet, TAKE ONE TABLET BY MOUTH TWICE DAILY, Disp: 60 tablet, Rfl: 12 .  metoprolol succinate (TOPROL-XL) 25 MG 24 hr tablet, Take 1 tablet (25 mg total) by mouth 2 (two) times daily. Take with or immediately following a meal. (Patient taking differently: Take 25 mg by mouth See admin instructions. Take with or immediately following a takes 1 tablet daily and another dose only if needed for tachycardia), Disp: 60 tablet, Rfl: 9 .  naproxen sodium (ANAPROX) 220 MG tablet, Take 440 mg by mouth daily as needed (PAIN)., Disp: , Rfl:    .  nitroGLYCERIN (NITROSTAT) 0.4 MG SL tablet, Place 1 tablet (0.4 mg total) under the tongue every 5 (five) minutes as needed for chest pain., Disp: 25 tablet, Rfl: 6 .  pantoprazole (PROTONIX) 40 MG tablet, Take 1 tablet (40 mg total) by mouth 2 (two) times daily., Disp: 60 tablet, Rfl: 5 .  promethazine (PHENERGAN) 25 MG tablet, Take 1 tablet (25 mg total) by mouth every 6 (six) hours as needed for nausea or vomiting. Reported on 05/31/2015, Disp: 30 tablet, Rfl: 3 .  tiZANidine (ZANAFLEX) 4 MG tablet, Take 4 mg by mouth 2 (two) times daily as needed (migraines)., Disp: , Rfl:  .  traZODone (DESYREL) 100 MG tablet, Take 2 tablets (200 mg total) by mouth at bedtime., Disp: 30 tablet, Rfl: 6 .  zolpidem (AMBIEN) 5 MG tablet, Take 1-2 tablets (5-10 mg total) by mouth at bedtime as needed for sleep., Disp: 40 tablet, Rfl: 3  Review of Systems  Constitutional: Negative for appetite change, chills, fatigue and fever.  Eyes: Positive for visual disturbance.  Respiratory: Negative for chest tightness and shortness of breath.  Cardiovascular: Positive for chest pain. Negative for palpitations.  Gastrointestinal: Negative for abdominal pain, nausea and vomiting.  Endocrine: Positive for polydipsia.  Neurological: Positive for numbness. Negative for dizziness and weakness.  Psychiatric/Behavioral: The patient is nervous/anxious.     Social History   Tobacco Use  . Smoking status: Former Smoker    Types: Cigarettes    Last attempt to quit: 08/31/1994    Years since quitting: 22.2  . Smokeless tobacco: Never Used  . Tobacco comment: quit 28 years ago  Substance Use Topics  . Alcohol use: No    Alcohol/week: 0.0 oz   Objective:   BP 140/90 (BP Location: Right Arm, Cuff Size: Normal)   Pulse 79   Temp 98.7 F (37.1 C) (Oral)   Resp 16   Wt 160 lb (72.6 kg)   SpO2 99% Comment: room air  BMI 29.26 kg/m  There were no vitals filed for this visit.   Physical Exam   General Appearance:     Alert, cooperative, no distress  Eyes:    PERRL, conjunctiva/corneas clear, EOM's intact       Lungs:     Clear to auscultation bilaterally, respirations unlabored  Heart:    Regular rate and rhythm  Neurologic:   Awake, alert, oriented x 3. No apparent focal neurological           defect.           Assessment & Plan:     1. Type 2 diabetes mellitus with complication, without long-term current use of insulin (HCC) Doing well on metformin.  - Hemoglobin A1c  2. Pure hypercholesterolemia Is tolerating Zetia well. Has not had lipids checked since starting.  - Lipid panel  3. Essential hypertension Stable. Continue current medications.    4. Adult hypothyroidism Tolerating current dose of levothyroxine well.  - TSH       [name], MD  [facility]

## 2016-12-09 ENCOUNTER — Other Ambulatory Visit: Payer: Self-pay

## 2016-12-09 DIAGNOSIS — F439 Reaction to severe stress, unspecified: Secondary | ICD-10-CM

## 2016-12-09 DIAGNOSIS — F5102 Adjustment insomnia: Secondary | ICD-10-CM

## 2016-12-09 LAB — LIPID PANEL
CHOL/HDL RATIO: 4.8 (calc) (ref <5.0)
Cholesterol: 162 mg/dL (ref <200)
HDL: 34 mg/dL — ABNORMAL LOW (ref >50)
LDL CHOLESTEROL (CALC): 103 mg/dL — AB
NON-HDL CHOLESTEROL (CALC): 128 mg/dL (ref <130)
TRIGLYCERIDES: 146 mg/dL (ref <150)

## 2016-12-09 LAB — HEMOGLOBIN A1C
HEMOGLOBIN A1C: 5 %{Hb} (ref <5.7)
Mean Plasma Glucose: 97 (calc)
eAG (mmol/L): 5.4 (calc)

## 2016-12-09 LAB — TSH: TSH: 1.53 m[IU]/L

## 2016-12-09 MED ORDER — ROSUVASTATIN CALCIUM 40 MG PO TABS: 40.0000 mg | ORAL_TABLET | Freq: Every day | ORAL | 3 refills | Status: DC

## 2016-12-09 MED ORDER — EZETIMIBE 10 MG PO TABS: 10.0000 mg | ORAL_TABLET | Freq: Every day | ORAL | 3 refills | Status: DC

## 2016-12-09 NOTE — Telephone Encounter (Signed)
Advised patient of results. Patient reports that she is no longer taking Zetia, but she is taking Crestor.

## 2016-12-09 NOTE — Telephone Encounter (Signed)
Advised patient as below. Ezetimibe was sent into [facility] per patient request.

## 2016-12-09 NOTE — Telephone Encounter (Signed)
-----   Message from [name], MD sent at 12/09/2016  7:48 AM EST ----- Cholesterol is high at 162, please verify that patient is taking ezetimibe. Also need to see if she is still taking Crestor (rosuvastatin). It is marked as discontinued on her medication list, but she is still supposed to be taking it.

## 2016-12-09 NOTE — Telephone Encounter (Signed)
She needs to take both to get her LDL below 70. Continue rosuvastatin 40mg  and send in prescription for ezetmibe to the pharmacy of her choice.   Need to schedule follow up for chol and diabetes in 3 months.

## 2016-12-18 ENCOUNTER — Encounter: Payer: Self-pay | Admitting: Family Medicine

## 2016-12-18 ENCOUNTER — Ambulatory Visit (INDEPENDENT_AMBULATORY_CARE_PROVIDER_SITE_OTHER): Payer: Medicare Other | Admitting: Family Medicine

## 2016-12-18 VITALS — BP 170/92 | HR 66 | Temp 98.6°F | Resp 16 | Wt 163.0 lb

## 2016-12-18 DIAGNOSIS — N183 Chronic kidney disease, stage 3 unspecified: Secondary | ICD-10-CM

## 2016-12-18 DIAGNOSIS — E538 Deficiency of other specified B group vitamins: Secondary | ICD-10-CM

## 2016-12-18 DIAGNOSIS — G629 Polyneuropathy, unspecified: Secondary | ICD-10-CM

## 2016-12-18 DIAGNOSIS — I2510 Atherosclerotic heart disease of native coronary artery without angina pectoris: Secondary | ICD-10-CM | POA: Diagnosis not present

## 2016-12-18 DIAGNOSIS — E039 Hypothyroidism, unspecified: Secondary | ICD-10-CM | POA: Diagnosis not present

## 2016-12-18 MED ORDER — GABAPENTIN 600 MG PO TABS: ORAL_TABLET | ORAL | 3 refills | Status: DC

## 2016-12-18 MED ORDER — LEVOTHYROXINE SODIUM 25 MCG PO TABS: 25.0000 ug | ORAL_TABLET | Freq: Every day | ORAL | 12 refills | Status: DC

## 2016-12-18 NOTE — Progress Notes (Signed)
Patient: [name] Female    DOB: [birth year]   46 y.o.   MRN: [id number] Visit Date: 12/18/2016  Today's Provider: [name], MD   Chief Complaint  Patient presents with  . Foot Pain   Subjective:    HPI Foot pain:  Patient presents  reporting that she has had pain in both feet for several years. She states the pain has worsened in the past few weeks. She has a constant burning pain in her big toe of the right foot. She states it feels like her toe is on fire. There has been some swelling in her feet. Patient denies any redness. Is currently taking gabapentin 300mg  two at night and one in the am. She states she cannot afford Lyrica.     No Known Allergies   Current Outpatient Medications:  .  ALPRAZolam (XANAX) 0.5 MG tablet, TAKE 1/2 TO 1 TABLET BY MOUTH EVERY 6 HOURS AS NEEDED (Patient taking differently: 1 TABLET TWICE DAILY AS NEEDED FOR ANXIETY), Disp: 60 tablet, Rfl: 5 .  aspirin EC 81 MG tablet, Take 1 tablet (81 mg total) by mouth daily., Disp: , Rfl:  .  citalopram (CELEXA) 40 MG tablet, Take 1 tablet (40 mg total) by mouth daily., Disp: 30 tablet, Rfl: 0 .  clopidogrel (PLAVIX) 75 MG tablet, Take 1 tablet (75 mg total) by mouth daily., Disp: 30 tablet, Rfl: 11 .  diltiazem (CARDIZEM CD) 120 MG 24 hr capsule, Take 1 capsule (120 mg total) by mouth daily., Disp: 90 capsule, Rfl: 3 .  ezetimibe (ZETIA) 10 MG tablet, Take 1 tablet (10 mg total) daily by mouth., Disp: 90 tablet, Rfl: 3 .  gabapentin (NEURONTIN) 300 MG capsule, TAKE ONE CAPSULE BY MOUTH THREE TIMES DAILY (Patient taking differently: TAKE 2 CAPSULES IN THE MORNING AND 1 CAPSULE AT BEDTIME), Disp: 90 capsule, Rfl: 12 .  isosorbide mononitrate (IMDUR) 60 MG 24 hr tablet, Take 1 tablet (60 mg total) by mouth daily., Disp: 30 tablet, Rfl: 6 .  levothyroxine (SYNTHROID, LEVOTHROID) 25 MCG tablet, Take 1 tablet (25 mcg total) by mouth daily before breakfast., Disp: 30 tablet, Rfl: 12 .  losartan  (COZAAR) 50 MG tablet, Take 1 tablet (50 mg total) by mouth daily. (Patient taking differently: Take 100 mg by mouth daily. ), Disp: 30 tablet, Rfl: 12 .  metFORMIN (GLUCOPHAGE) 1000 MG tablet, TAKE ONE TABLET BY MOUTH TWICE DAILY, Disp: 60 tablet, Rfl: 12 .  metoprolol succinate (TOPROL-XL) 25 MG 24 hr tablet, Take 1 tablet (25 mg total) by mouth 2 (two) times daily. Take with or immediately following a meal. (Patient taking differently: Take 25 mg by mouth See admin instructions. Take with or immediately following a takes 1 tablet daily and another dose only if needed for tachycardia), Disp: 60 tablet, Rfl: 9 .  naproxen sodium (ANAPROX) 220 MG tablet, Take 440 mg by mouth daily as needed (PAIN)., Disp: , Rfl:  .  nitroGLYCERIN (NITROSTAT) 0.4 MG SL tablet, Place 1 tablet (0.4 mg total) under the tongue every 5 (five) minutes as needed for chest pain., Disp: 25 tablet, Rfl: 6 .  pantoprazole (PROTONIX) 40 MG tablet, Take 1 tablet (40 mg total) by mouth 2 (two) times daily., Disp: 60 tablet, Rfl: 5 .  promethazine (PHENERGAN) 25 MG tablet, Take 1 tablet (25 mg total) by mouth every 6 (six) hours as needed for nausea or vomiting. Reported on 05/31/2015, Disp: 30 tablet, Rfl: 3 .  rosuvastatin (CRESTOR)  40 MG tablet, Take 1 tablet (40 mg total) daily at 6 PM by mouth., Disp: 3 tablet, Rfl: 3 .  tiZANidine (ZANAFLEX) 4 MG tablet, Take 4 mg by mouth 2 (two) times daily as needed (migraines)., Disp: , Rfl:  .  traZODone (DESYREL) 100 MG tablet, Take 2 tablets (200 mg total) by mouth at bedtime., Disp: 30 tablet, Rfl: 6 .  zolpidem (AMBIEN) 5 MG tablet, Take 1-2 tablets (5-10 mg total) by mouth at bedtime as needed for sleep., Disp: 40 tablet, Rfl: 3  Review of Systems  Constitutional: Positive for fatigue. Negative for appetite change, chills and fever.  HENT: Positive for rhinorrhea.   Respiratory: Negative for chest tightness and shortness of breath.   Cardiovascular: Negative for chest pain and  palpitations.  Gastrointestinal: Negative for abdominal pain, nausea and vomiting.  Musculoskeletal: Positive for arthralgias (pain in both feet).  Neurological: Positive for headaches. Negative for dizziness and weakness.    Social History   Tobacco Use  . Smoking status: Former Smoker    Types: Cigarettes    Last attempt to quit: 08/31/1994    Years since quitting: 22.3  . Smokeless tobacco: Never Used  . Tobacco comment: quit 28 years ago  Substance Use Topics  . Alcohol use: No    Alcohol/week: 0.0 oz   Objective:   BP (!) 170/92 (BP Location: Right Arm, Cuff Size: Normal)   Pulse 66   Temp 98.6 F (37 C) (Oral)   Resp 16   Wt 163 lb (73.9 kg)   SpO2 100% Comment: room air  BMI 29.81 kg/m  Vitals:   12/18/16 1131 12/18/16 1136  BP: (!) 172/100 (!) 170/92  Pulse: 66   Resp: 16   Temp: 98.6 F (37 C)   TempSrc: Oral   SpO2: 100%   Weight: 163 lb (73.9 kg)      Physical Exam   General Appearance:    Alert, cooperative, no distress  Eyes:    PERRL, conjunctiva/corneas clear, EOM's intact       Lungs:     Clear to auscultation bilaterally, respirations unlabored  Heart:    Regular rate and rhythm  Ext:  2+ pedal pulses bilaterally cap refill <2 seconds.   Neurologic:   Awake, alert, oriented x 3. Diminished s/s both feet. .           Assessment & Plan:     1. Neuropathy ([place]) Likely diabetic neuropathy. Increase gabapentin, may be exacerbate by b12 and d deficiencies.  - gabapentin (NEURONTIN) 600 MG tablet; Take one every morning and two every evening  Dispense: 90 tablet; Refill: 3  2. Vitamin B12 deficiency  - Vitamin B12  3. CKD (chronic kidney disease) stage 3, GFR 30-59 ml/min (HCC)  - VITAMIN D 25 Hydroxy (Vit-D Deficiency, Fractures)  4. . Adult hypothyroidism Needs refill - levothyroxine (SYNTHROID, LEVOTHROID) 25 MCG tablet; Take 1 tablet (25 mcg total) daily before breakfast by mouth.  Dispense: 30 tablet; Refill: 12       [name], MD  [facility]

## 2016-12-19 LAB — VITAMIN D 25 HYDROXY (VIT D DEFICIENCY, FRACTURES): Vit D, 25-Hydroxy: 31 ng/mL (ref 30–100)

## 2016-12-19 LAB — VITAMIN B12: Vitamin B-12: 774 pg/mL (ref 200–1100)

## 2016-12-22 ENCOUNTER — Telehealth: Payer: Self-pay

## 2016-12-22 NOTE — Telephone Encounter (Signed)
Patient advised as below. Patient reports that she will try to take 2 capsules of gabapentin during the morning, due to pain being worse during the day. Patient reports she does not need a referral to neurology, pt is already established with one. sd

## 2016-12-22 NOTE — Telephone Encounter (Signed)
-----   Message from [name], MD sent at 12/21/2016 12:27 PM EST ----- b12 and vitamin d levels are normal. Neuropathy is likely entirely due to diabetes. If not improved since increasing dose of gabapentin then I would recommend referral to neurology.

## 2017-01-08 ENCOUNTER — Other Ambulatory Visit: Payer: Self-pay | Admitting: Family Medicine

## 2017-01-08 ENCOUNTER — Telehealth: Payer: Self-pay | Admitting: *Deleted

## 2017-01-08 DIAGNOSIS — F439 Reaction to severe stress, unspecified: Secondary | ICD-10-CM

## 2017-01-08 DIAGNOSIS — F5102 Adjustment insomnia: Secondary | ICD-10-CM

## 2017-01-08 MED ORDER — ALPRAZOLAM 0.5 MG PO TABS
ORAL_TABLET | ORAL | 5 refills | Status: DC
Start: 2017-01-08 — End: 2017-01-08

## 2017-01-08 MED ORDER — ALPRAZOLAM 0.5 MG PO TABS: 0.5000 mg | ORAL_TABLET | Freq: Four times a day (QID) | ORAL | 5 refills | Status: DC | PRN

## 2017-01-08 MED ORDER — ALPRAZOLAM 0.5 MG PO TABS: 0.2500 mg | ORAL_TABLET | Freq: Four times a day (QID) | ORAL | 5 refills | Status: DC | PRN

## 2017-01-08 NOTE — Telephone Encounter (Signed)
[facility] called for clarification for alprazolam rx that was sent to [facility]? Rx has 2 different directions. Please advise?

## 2017-01-08 NOTE — Telephone Encounter (Signed)
[name] with [facility] [address] is requesting a new Rx for ALPRAZolam (XANAX) 0.5 MG tablet   CB#[phone]/MW

## 2017-01-13 ENCOUNTER — Other Ambulatory Visit: Payer: Self-pay | Admitting: Family Medicine

## 2017-01-13 MED ORDER — CITALOPRAM HYDROBROMIDE 40 MG PO TABS: 40.0000 mg | ORAL_TABLET | Freq: Every day | ORAL | 11 refills | Status: DC

## 2017-01-13 NOTE — Telephone Encounter (Signed)
[facility] faxed refill request for following medications: citalopram (CELEXA) 40 MG tablet    Please advise,Thanks [place]

## 2017-01-19 DIAGNOSIS — N183 Chronic kidney disease, stage 3 (moderate): Secondary | ICD-10-CM | POA: Diagnosis not present

## 2017-01-19 DIAGNOSIS — I10 Essential (primary) hypertension: Secondary | ICD-10-CM | POA: Diagnosis not present

## 2017-01-19 DIAGNOSIS — E1122 Type 2 diabetes mellitus with diabetic chronic kidney disease: Secondary | ICD-10-CM | POA: Diagnosis not present

## 2017-01-19 DIAGNOSIS — E559 Vitamin D deficiency, unspecified: Secondary | ICD-10-CM | POA: Diagnosis not present

## 2017-01-22 ENCOUNTER — Emergency Department
Admission: EM | Admit: 2017-01-22 | Discharge: 2017-01-22 | Disposition: A | Discharge status: Home / Self Care | Payer: Medicare Other | Attending: Emergency Medicine | Admitting: Emergency Medicine

## 2017-01-22 ENCOUNTER — Emergency Department: Payer: Medicare Other

## 2017-01-22 ENCOUNTER — Ambulatory Visit: Payer: Self-pay | Admitting: Family Medicine

## 2017-01-22 ENCOUNTER — Other Ambulatory Visit: Payer: Self-pay

## 2017-01-22 DIAGNOSIS — K529 Noninfective gastroenteritis and colitis, unspecified: Secondary | ICD-10-CM | POA: Diagnosis not present

## 2017-01-22 DIAGNOSIS — I5022 Chronic systolic (congestive) heart failure: Secondary | ICD-10-CM | POA: Diagnosis not present

## 2017-01-22 DIAGNOSIS — I130 Hypertensive heart and chronic kidney disease with heart failure and stage 1 through stage 4 chronic kidney disease, or unspecified chronic kidney disease: Secondary | ICD-10-CM | POA: Insufficient documentation

## 2017-01-22 DIAGNOSIS — E1122 Type 2 diabetes mellitus with diabetic chronic kidney disease: Secondary | ICD-10-CM | POA: Diagnosis not present

## 2017-01-22 DIAGNOSIS — E039 Hypothyroidism, unspecified: Secondary | ICD-10-CM | POA: Insufficient documentation

## 2017-01-22 DIAGNOSIS — R109 Unspecified abdominal pain: Secondary | ICD-10-CM | POA: Diagnosis not present

## 2017-01-22 DIAGNOSIS — I2510 Atherosclerotic heart disease of native coronary artery without angina pectoris: Secondary | ICD-10-CM | POA: Insufficient documentation

## 2017-01-22 DIAGNOSIS — N184 Chronic kidney disease, stage 4 (severe): Secondary | ICD-10-CM | POA: Diagnosis not present

## 2017-01-22 DIAGNOSIS — Z87891 Personal history of nicotine dependence: Secondary | ICD-10-CM | POA: Diagnosis not present

## 2017-01-22 DIAGNOSIS — N200 Calculus of kidney: Secondary | ICD-10-CM | POA: Diagnosis not present

## 2017-01-22 LAB — COMPREHENSIVE METABOLIC PANEL
ALK PHOS: 171 U/L — AB (ref 38–126)
ALT: 41 U/L (ref 14–54)
ANION GAP: 9 (ref 5–15)
AST: 32 U/L (ref 15–41)
Albumin: 4.4 g/dL (ref 3.5–5.0)
BILIRUBIN TOTAL: 0.6 mg/dL (ref 0.3–1.2)
BUN: 14 mg/dL (ref 6–20)
CALCIUM: 9.5 mg/dL (ref 8.9–10.3)
CO2: 25 mmol/L (ref 22–32)
Chloride: 103 mmol/L (ref 101–111)
Creatinine, Ser: 1.7 mg/dL — ABNORMAL HIGH (ref 0.44–1.00)
GFR calc non Af Amer: 35 mL/min — ABNORMAL LOW (ref >60)
GFR, EST AFRICAN AMERICAN: 40 mL/min — AB (ref >60)
Glucose, Bld: 120 mg/dL — ABNORMAL HIGH (ref 65–99)
Potassium: 4.2 mmol/L (ref 3.5–5.1)
SODIUM: 137 mmol/L (ref 135–145)
TOTAL PROTEIN: 7.9 g/dL (ref 6.5–8.1)

## 2017-01-22 LAB — CBC
HCT: 39.4 % (ref 35.0–47.0)
HEMOGLOBIN: 13.3 g/dL (ref 12.0–16.0)
MCH: 28.2 pg (ref 26.0–34.0)
MCHC: 33.9 g/dL (ref 32.0–36.0)
MCV: 83.3 fL (ref 80.0–100.0)
Platelets: 221 10*3/uL (ref 150–440)
RBC: 4.73 MIL/uL (ref 3.80–5.20)
RDW: 14 % (ref 11.5–14.5)
WBC: 8.3 10*3/uL (ref 3.6–11.0)

## 2017-01-22 LAB — URINALYSIS, COMPLETE (UACMP) WITH MICROSCOPIC
Bacteria, UA: NONE SEEN
Bilirubin Urine: NEGATIVE
GLUCOSE, UA: NEGATIVE mg/dL
HGB URINE DIPSTICK: NEGATIVE
KETONES UR: NEGATIVE mg/dL
Leukocytes, UA: NEGATIVE
NITRITE: NEGATIVE
PROTEIN: 30 mg/dL — AB
Specific Gravity, Urine: 1.014 (ref 1.005–1.030)
pH: 5 (ref 5.0–8.0)

## 2017-01-22 LAB — LIPASE, BLOOD: Lipase: 40 U/L (ref 11–51)

## 2017-01-22 MED ORDER — CIPROFLOXACIN HCL 500 MG PO TABS
500.0000 mg | ORAL_TABLET | Freq: Once | ORAL | Status: AC
  Administered 2017-01-22: 500 mg via ORAL

## 2017-01-22 MED ORDER — METRONIDAZOLE 500 MG PO TABS
500.0000 mg | ORAL_TABLET | Freq: Once | ORAL | Status: AC
  Administered 2017-01-22: 500 mg via ORAL

## 2017-01-22 MED ORDER — MORPHINE SULFATE (PF) 4 MG/ML IV SOLN
4.0000 mg | Freq: Once | INTRAVENOUS | Status: AC
  Administered 2017-01-22: 4 mg via INTRAVENOUS
  Filled 2017-01-22: qty 1

## 2017-01-22 MED ORDER — HYDROMORPHONE HCL 1 MG/ML IJ SOLN
1.0000 mg | Freq: Once | INTRAMUSCULAR | Status: AC
  Administered 2017-01-22: 1 mg via INTRAVENOUS
  Filled 2017-01-22: qty 1

## 2017-01-22 MED ORDER — DIPHENHYDRAMINE HCL 50 MG/ML IJ SOLN
25.0000 mg | Freq: Once | INTRAMUSCULAR | Status: AC
  Administered 2017-01-22: 25 mg via INTRAVENOUS

## 2017-01-22 MED ORDER — IOPAMIDOL (ISOVUE-300) INJECTION 61%
75.0000 mL | Freq: Once | INTRAVENOUS | Status: AC | PRN
  Administered 2017-01-22: 75 mL via INTRAVENOUS
  Filled 2017-01-22: qty 75

## 2017-01-22 MED ORDER — CIPROFLOXACIN HCL 500 MG PO TABS: 500.0000 mg | ORAL_TABLET | Freq: Two times a day (BID) | ORAL | 0 refills | Status: DC

## 2017-01-22 MED ORDER — ONDANSETRON 4 MG PO TBDP: 4.0000 mg | ORAL_TABLET | Freq: Three times a day (TID) | ORAL | 0 refills | Status: DC | PRN

## 2017-01-22 MED ORDER — METRONIDAZOLE 500 MG PO TABS: 500.0000 mg | ORAL_TABLET | Freq: Three times a day (TID) | ORAL | 0 refills | Status: DC

## 2017-01-22 MED ORDER — DIPHENHYDRAMINE HCL 50 MG/ML IJ SOLN
INTRAMUSCULAR | Status: AC
  Administered 2017-01-22: 25 mg via INTRAVENOUS
  Filled 2017-01-22: qty 1

## 2017-01-22 MED ORDER — METRONIDAZOLE 500 MG PO TABS
ORAL_TABLET | ORAL | Status: AC
Start: 2017-01-22 — End: 2017-01-22
  Administered 2017-01-22: 500 mg via ORAL
  Filled 2017-01-22: qty 1

## 2017-01-22 MED ORDER — CIPROFLOXACIN HCL 500 MG PO TABS
ORAL_TABLET | ORAL | Status: AC
  Administered 2017-01-22: 500 mg via ORAL
  Filled 2017-01-22: qty 1

## 2017-01-22 MED ORDER — OXYCODONE-ACETAMINOPHEN 5-325 MG PO TABS: 1.0000 | ORAL_TABLET | Freq: Four times a day (QID) | ORAL | 0 refills | Status: DC | PRN

## 2017-01-22 MED ORDER — SODIUM CHLORIDE 0.9 % IV SOLN
Freq: Once | INTRAVENOUS | Status: AC
  Administered 2017-01-22: 15:00:00 via INTRAVENOUS

## 2017-01-22 MED ORDER — ONDANSETRON HCL 4 MG/2ML IJ SOLN
4.0000 mg | Freq: Once | INTRAMUSCULAR | Status: AC
  Administered 2017-01-22: 4 mg via INTRAVENOUS
  Filled 2017-01-22: qty 2

## 2017-01-22 NOTE — ED Notes (Signed)
Pt ambulatory to wheel chair upon discharge. Verbalized understanding of discharge instructions, prescriptions, follow-up care and education re: not taking tylenol with prescribed pain meds. Pt son driving pt home. A&O x4. Skin warm and dry.

## 2017-01-22 NOTE — ED Provider Notes (Signed)
[facility] Emergency Department Provider Note       Time seen: ----------------------------------------- 2:50 PM on 01/22/2017 -----------------------------------------   I have reviewed the triage vital signs and the nursing notes.  HISTORY   Chief Complaint Abdominal Pain    HPI [name] [address] [name] is a 46 y.o. female with a history of anemia, anxiety, bipolar disorder, CHF, chronic kidney disease, diabetes, hypertension who presents to the ED for lower abdominal pain for the past 3 days.  Patient states the pain feels like something is being twisted inside her abdomen.  She reports she had a cholecystectomy in October reports persistent diarrhea since then which is unchanged.  She has not had vomiting although she did vomit last night.  Past Medical History:  Diagnosis Date  . Anemia    iron deficiency anemia  . Anxiety   . Aortic arch aneurysm ([place])   . Arthritis   . Bipolar disorder ([place])   . BRCA negative 2014  . CAD (coronary artery disease)    a. cath 08/2003 nonobs dzs;  b. 11/2014: mLAD 85-90% s/p PCI/DES (Xience 3.25 x 23); c. cath 04/2015: 70% mD1 @ bifurcation. Patent LAD stent - > Med Rx.; d. Cath-PCI 5/17: Patent LAD stent, D1 now ~80% - PCI Resolute DES 2.25 x 12.    . Chronic systolic CHF (congestive heart failure) ([place])    a. echo 01/29/2013 EF 40-45%, basal anteroseptal & mid anteroseptal segments abnl; b echo 05/2012 EF 50-55%, mildly dilated LA, nl RVSP; c. 10/2015 Echo: EF 55-60%, mild conc LVH, no rwma.  . CKD (chronic kidney disease), stage IV ([place])   . Colon polyp   . CVA (cerebral vascular accident) ([place])    a. 06/2008-TEE nl LV fxn; b. 01/2013 - notes indicate short run of SVT at that time  . Depression   . Diabetes mellitus without complication ([place])   . Family history of breast cancer    BRCA neg 2014  . Gastric ulcer 04/27/2011  . GERD (gastroesophageal reflux disease)   . Headache    Migraines  . Hyperlipidemia   .  Hypertension   . Hypertensive heart disease   . Hypothyroidism   . Ischemic cardiomyopathy    a. EF prev 40-45% in 2014-->Improved to 55-60% (10/2015).  . Malignant melanoma of skin of scalp ([place])   . MI, acute, non ST segment elevation ([place])   . Morbid obesity ([place])    a. s/p gastric bypass.  . Multiple sclerosis ([place]) 2015  . Myocardial infarction ([place]) 06/03/2015  . Neuromuscular disorder ([place])   . Neuropathy   . Orthostatic hypotension   . S/P drug eluting coronary stent placement 06/04/2015  . Syncope     Patient Active Problem List   Diagnosis Date Noted  . Hx of colonic polyps   . Abdominal pain, epigastric   . Bariatric surgery status   . Sinus tachycardia 02/27/2016  . Hypokalemia 12/14/2015  . H/O medication noncompliance 12/14/2015  . Acute pyelonephritis   . Diaphoresis   . Emesis   . CKD (chronic kidney disease), stage IV ([place])   . CAD (coronary artery disease)   . Hypertensive heart disease   . CKD (chronic kidney disease) stage 3, GFR 30-59 ml/min (HCC) 06/05/2015  . Presence of drug coated stent in LAD coronary artery & now in D1 06/04/2015  . Coronary artery disease involving native coronary artery of native heart with angina pectoris with documented spasm ([place])   . Colitis 06/03/2015  .  Carotid stenosis 04/30/2015  . Type 2 diabetes mellitus with complication, without long-term current use of insulin (HCC)   . Atypical chest pain   . Iron deficiency anemia 03/22/2015  . Depression 02/22/2015  . Vitamin B12 deficiency 02/18/2015  . Non compliance w medication regimen 02/18/2015  . Misuse of medications for pain 02/18/2015  . Severe recurrent major depression without psychotic features (HCC) 02/15/2015  . Helicobacter pylori infection 11/23/2014  . Hemiparesis, left (HCC) 11/23/2014  . Benign neoplasm of colon 11/20/2014  . Malignant melanoma (HCC) 08/25/2014  . Chronic systolic CHF (congestive heart failure) (HCC)   . Incomplete bladder emptying  07/12/2014  . Adult hypothyroidism 12/30/2013  . Aberrant subclavian artery 11/17/2013  . Multiple sclerosis (HCC) 11/02/2013  . History of CVA (cerebrovascular accident) 06/20/2013  . Headache, migraine 05/29/2013  . Hyperlipidemia   . GERD (gastroesophageal reflux disease)   . Neuropathy (HCC) 01/02/2011  . CVA (cerebral vascular accident) (HCC) 06/21/2008  . Essential hypertension 05/01/2008    Past Surgical History:  Procedure Laterality Date  . APPENDECTOMY    . CARDIAC CATHETERIZATION N/A 11/09/2014   Procedure: Coronary Angiography;  Surgeon: Timothy J Gollan, MD;  Location: [facility]IVE [facility];  Service: Cardiovascular;  Laterality: N/A;  . CARDIAC CATHETERIZATION N/A 11/12/2014   Procedure: Coronary Stent Intervention;  Surgeon: Alexander Paraschos, MD;  Location: [facility];  Service: Cardiovascular;  Laterality: N/A;  . CARDIAC CATHETERIZATION N/A 04/18/2015   Procedure: Left Heart Cath and Coronary Angiography;  Surgeon: Timothy J Gollan, MD;  Location: [facility];  Service: Cardiovascular;  Laterality: N/A;  . CARDIAC CATHETERIZATION Left 06/04/2015   Procedure: Left Heart Cath and Coronary Angiography;  Surgeon: Muhammad A Arida, MD;  Location: [facility];  Service: Cardiovascular;  Laterality: Left;  . CARDIAC CATHETERIZATION N/A 06/04/2015   Procedure: Coronary Stent Intervention;  Surgeon: Muhammad A Arida, MD;  Location: [facility];  Service: Cardiovascular;  Laterality: N/A;  . CESAREAN SECTION  2001  . CHOLECYSTECTOMY N/A 11/18/2016   Procedure: LAPAROSCOPIC CHOLECYSTECTOMY WITH INTRAOPERATIVE CHOLANGIOGRAM;  Surgeon: Sankar, Seeplaputhur G, MD;  Location: [facility];  Service: General;  Laterality: N/A;  . COLONOSCOPY WITH PROPOFOL N/A 04/27/2016   Procedure: COLONOSCOPY WITH PROPOFOL;  Surgeon: Darren Wohl, MD;  Location: [facility];  Service: Endoscopy;  Laterality: N/A;  . CORONARY ANGIOPLASTY    . DILATION AND CURETTAGE  OF UTERUS    . ESOPHAGOGASTRODUODENOSCOPY (EGD) WITH PROPOFOL N/A 09/14/2014   Procedure: ESOPHAGOGASTRODUODENOSCOPY (EGD) WITH PROPOFOL;  Surgeon: Matthew Gordon Rein, MD;  Location: [facility];  Service: Endoscopy;  Laterality: N/A;  . ESOPHAGOGASTRODUODENOSCOPY (EGD) WITH PROPOFOL N/A 04/27/2016   Procedure: ESOPHAGOGASTRODUODENOSCOPY (EGD) WITH PROPOFOL;  Surgeon: Darren Wohl, MD;  Location: [facility];  Service: Endoscopy;  Laterality: N/A;  Diabetic - oral meds  . GASTRIC BYPASS  09/2009   [facility]   . Left Carotid to sublcavian artery bypass w/ subclavian artery ligation     a. Performed @ [facility].  . MELANOMA EXCISION  2016   Dr. Dasher  . NOVASURE ABLATION  2002  . RIGHT OOPHORECTOMY    . SHOULDER ARTHROSCOPY WITH OPEN ROTATOR CUFF REPAIR Right 01/07/2016   Procedure: SHOULDER ARTHROSCOPY WITH DEBRIDMENT, SUBACHROMIAL DECOMPRESSION;  Surgeon: John J Poggi, MD;  Location: [facility];  Service: Orthopedics;  Laterality: Right;  . TRIGGER FINGER RELEASE Right     Middle Finger    Allergies Tramadol  Social History Social History     Tobacco Use  . Smoking status: Former Smoker    Types: Cigarettes    Last attempt to quit: 08/31/1994    Years since quitting: 22.4  . Smokeless tobacco: Never Used  . Tobacco comment: quit 28 years ago  Substance Use Topics  . Alcohol use: No    Alcohol/week: 0.0 oz  . Drug use: No    Review of Systems Constitutional: Negative for fever. Cardiovascular: Negative for chest pain. Respiratory: Negative for shortness of breath. Gastrointestinal: Positive for abdominal pain, diarrhea Genitourinary: Negative for dysuria. Musculoskeletal: Negative for back pain. Skin: Negative for rash. Neurological: Negative for headaches, focal weakness or numbness.  All systems negative/normal/unremarkable except as stated in the HPI  ____________________________________________   PHYSICAL EXAM:  VITAL SIGNS: ED Triage  Vitals  Enc Vitals Group     BP 01/22/17 1229 (!) 142/97     Pulse Rate 01/22/17 1229 83     Resp 01/22/17 1229 18     Temp 01/22/17 1229 98.8 F (37.1 C)     Temp Source 01/22/17 1229 Oral     SpO2 01/22/17 1229 99 %     Weight 01/22/17 1224 163 lb (73.9 kg)     Height 01/22/17 1224 5' 2" (1.575 m)     Head Circumference --      Peak Flow --      Pain Score 01/22/17 1224 9     Pain Loc --      Pain Edu? --      Excl. in GC? --     Constitutional: Alert and oriented. Well appearing and in no distress. Eyes: Conjunctivae are normal. Normal extraocular movements. ENT   Head: Normocephalic and atraumatic.   Nose: No congestion/rhinnorhea.   Mouth/Throat: Mucous membranes are moist.   Neck: No stridor. Cardiovascular: Normal rate, regular rhythm. No murmurs, rubs, or gallops. Respiratory: Normal respiratory effort without tachypnea nor retractions. Breath sounds are clear and equal bilaterally. No wheezes/rales/rhonchi. Gastrointestinal: Nonfocal abdominal tenderness, normal bowel sounds. Musculoskeletal: Nontender with normal range of motion in extremities. No lower extremity tenderness nor edema. Neurologic:  Normal speech and language. No gross focal neurologic deficits are appreciated.  Skin:  Skin is warm, dry and intact. No rash noted. Psychiatric: Mood and affect are normal. Speech and behavior are normal.  ____________________________________________  ED COURSE:  As part of my medical decision making, I reviewed the following data within the electronic medical record:  History obtained from family if available, nursing notes, old chart and ekg, as well as notes from prior ED visits. Patient presented for abdominal pain and vomiting, we will assess with labs and imaging as indicated at this time.   Procedures ____________________________________________   LABS (pertinent positives/negatives)  Labs Reviewed  COMPREHENSIVE METABOLIC PANEL - Abnormal; Notable  for the following components:      Result Value   Glucose, Bld 120 (*)    Creatinine, Ser 1.70 (*)    Alkaline Phosphatase 171 (*)    GFR calc non Af Amer 35 (*)    GFR calc Af Amer 40 (*)    All other components within normal limits  URINALYSIS, COMPLETE (UACMP) WITH MICROSCOPIC - Abnormal; Notable for the following components:   Color, Urine YELLOW (*)    APPearance HAZY (*)    Protein, ur 30 (*)    Squamous Epithelial / LPF 6-30 (*)    All other components within normal limits  LIPASE, BLOOD  CBC    RADIOLOGY  CT of the abdomen pelvis   with contrast IMPRESSION: 1. Collapsed appearance of the descending colon with suspected mild wall thickening at the splenic flexure ; there does appear to be minimal edema surrounding the left colon, and suspect that the findings are secondary to a mild colitis of infectious or inflammatory etiology. 2. Interval cholecystectomy 3. Punctate nonobstructing stone in the left kidney 4. Status post gastric bypass with no evidence for a bowel obstruction ____________________________________________  DIFFERENTIAL DIAGNOSIS   Appendicitis, cystitis, renal colic, UTI, bowel obstruction, constipation, viral illness  FINAL ASSESSMENT AND PLAN  Abdominal pain, colitis   Plan: Patient had presented for abdominal pain as well as diarrhea. Patient's labs were overall reassuring and stable from her prior with elevated creatinine. Patient's imaging revealed what looks like colitis in the descending colon.  She has a complicated medical history.  She reports she has not been the same after gastric bypass surgery.  She has had frequent issues with nausea and vomiting and has seen at least 3 different gastroenterologist.  Currently she takes Protonix as an antacid.  Today her symptoms are more abdominal pain and diarrhea related.  We will start her on Cipro and Flagyl as well as provide pain medicine and antiemetics.  I did stress to her the need for  outpatient GI follow-up.   Earleen Newport, MD   Note: This note was generated in part or whole with voice recognition software. Voice recognition is usually quite accurate but there are transcription errors that can and very often do occur. I apologize for any typographical errors that were not detected and corrected.     Earleen Newport, MD 01/22/17 [phone]

## 2017-01-22 NOTE — Progress Notes (Deleted)
Patient: [name] Female    DOB: 1970/10/23   46 y.o.   MRN: [id number] Visit Date: 01/22/2017  Today's Provider: [name], MD   No chief complaint on file.  Subjective:    Abdominal Pain  Associated symptoms include diarrhea. Pertinent negatives include no nausea.       No Known Allergies   Current Outpatient Medications:  .  ALPRAZolam (XANAX) 0.5 MG tablet, Take 0.5-1 tablets (0.25-0.5 mg total) by mouth every 6 (six) hours as needed for anxiety., Disp: 60 tablet, Rfl: 5 .  aspirin EC 81 MG tablet, Take 1 tablet (81 mg total) by mouth daily., Disp: , Rfl:  .  citalopram (CELEXA) 40 MG tablet, Take 1 tablet (40 mg total) by mouth daily., Disp: 30 tablet, Rfl: 11 .  clopidogrel (PLAVIX) 75 MG tablet, Take 1 tablet (75 mg total) by mouth daily., Disp: 30 tablet, Rfl: 11 .  diltiazem (CARDIZEM CD) 120 MG 24 hr capsule, Take 1 capsule (120 mg total) by mouth daily., Disp: 90 capsule, Rfl: 3 .  ezetimibe (ZETIA) 10 MG tablet, Take 1 tablet (10 mg total) daily by mouth., Disp: 90 tablet, Rfl: 3 .  gabapentin (NEURONTIN) 600 MG tablet, Take one every morning and two every evening, Disp: 90 tablet, Rfl: 3 .  isosorbide mononitrate (IMDUR) 60 MG 24 hr tablet, Take 1 tablet (60 mg total) by mouth daily., Disp: 30 tablet, Rfl: 6 .  levothyroxine (SYNTHROID, LEVOTHROID) 25 MCG tablet, Take 1 tablet (25 mcg total) daily before breakfast by mouth., Disp: 30 tablet, Rfl: 12 .  losartan (COZAAR) 50 MG tablet, Take 1 tablet (50 mg total) by mouth daily. (Patient taking differently: Take 100 mg by mouth daily. ), Disp: 30 tablet, Rfl: 12 .  metFORMIN (GLUCOPHAGE) 1000 MG tablet, TAKE ONE TABLET BY MOUTH TWICE DAILY, Disp: 60 tablet, Rfl: 12 .  metoprolol succinate (TOPROL-XL) 25 MG 24 hr tablet, Take 1 tablet (25 mg total) by mouth 2 (two) times daily. Take with or immediately following a meal. (Patient taking differently: Take 25 mg by mouth See admin instructions. Take with or  immediately following a takes 1 tablet daily and another dose only if needed for tachycardia), Disp: 60 tablet, Rfl: 9 .  naproxen sodium (ANAPROX) 220 MG tablet, Take 440 mg by mouth daily as needed (PAIN)., Disp: , Rfl:  .  nitroGLYCERIN (NITROSTAT) 0.4 MG SL tablet, Place 1 tablet (0.4 mg total) under the tongue every 5 (five) minutes as needed for chest pain., Disp: 25 tablet, Rfl: 6 .  pantoprazole (PROTONIX) 40 MG tablet, Take 1 tablet (40 mg total) by mouth 2 (two) times daily., Disp: 60 tablet, Rfl: 5 .  promethazine (PHENERGAN) 25 MG tablet, Take 1 tablet (25 mg total) by mouth every 6 (six) hours as needed for nausea or vomiting. Reported on 05/31/2015, Disp: 30 tablet, Rfl: 3 .  rosuvastatin (CRESTOR) 40 MG tablet, Take 1 tablet (40 mg total) daily at 6 PM by mouth., Disp: 3 tablet, Rfl: 3 .  tiZANidine (ZANAFLEX) 4 MG tablet, Take 4 mg by mouth 2 (two) times daily as needed (migraines)., Disp: , Rfl:  .  traZODone (DESYREL) 100 MG tablet, Take 2 tablets (200 mg total) by mouth at bedtime., Disp: 30 tablet, Rfl: 6 .  zolpidem (AMBIEN) 5 MG tablet, Take 1-2 tablets (5-10 mg total) by mouth at bedtime as needed for sleep., Disp: 40 tablet, Rfl: 3  Review of Systems  Constitutional: Negative  for appetite change and fatigue.  Respiratory: Negative for chest tightness and shortness of breath.   Cardiovascular: Negative for chest pain and palpitations.  Gastrointestinal: Positive for abdominal pain and diarrhea. Negative for nausea.  Neurological: Negative for dizziness and weakness.    Social History   Tobacco Use  . Smoking status: Former Smoker    Types: Cigarettes    Last attempt to quit: 08/31/1994    Years since quitting: 22.4  . Smokeless tobacco: Never Used  . Tobacco comment: quit 28 years ago  Substance Use Topics  . Alcohol use: No    Alcohol/week: 0.0 oz   Objective:   There were no vitals taken for this visit. There were no vitals filed for this visit.   Physical  Exam      Assessment & Plan:           [name], MD  [facility]

## 2017-01-22 NOTE — ED Triage Notes (Signed)
Pt reports lower abdominal pain for the past 3 days, pt states the pain feels like something is being twisted.  Pt s/p lap chole in Oct, pt reports persistent diarrhea since then, but has remained unchanged. Pt reports nausea and vomiting, but no vomiting this morning but did vomit last night.

## 2017-01-27 ENCOUNTER — Emergency Department: Payer: Medicare Other

## 2017-01-27 ENCOUNTER — Other Ambulatory Visit: Payer: Self-pay

## 2017-01-27 ENCOUNTER — Inpatient Hospital Stay
Admission: EM | Admit: 2017-01-27 | Discharge: 2017-01-29 | DRG: 386 | Disposition: A | Discharge status: Home / Self Care | Payer: Medicare Other | Attending: Internal Medicine | Admitting: Internal Medicine

## 2017-01-27 DIAGNOSIS — K277 Chronic peptic ulcer, site unspecified, without hemorrhage or perforation: Secondary | ICD-10-CM | POA: Diagnosis present

## 2017-01-27 DIAGNOSIS — Z8249 Family history of ischemic heart disease and other diseases of the circulatory system: Secondary | ICD-10-CM

## 2017-01-27 DIAGNOSIS — K219 Gastro-esophageal reflux disease without esophagitis: Secondary | ICD-10-CM | POA: Diagnosis present

## 2017-01-27 DIAGNOSIS — Z808 Family history of malignant neoplasm of other organs or systems: Secondary | ICD-10-CM

## 2017-01-27 DIAGNOSIS — F319 Bipolar disorder, unspecified: Secondary | ICD-10-CM | POA: Diagnosis present

## 2017-01-27 DIAGNOSIS — Z86010 Personal history of colonic polyps: Secondary | ICD-10-CM

## 2017-01-27 DIAGNOSIS — Z9884 Bariatric surgery status: Secondary | ICD-10-CM

## 2017-01-27 DIAGNOSIS — I255 Ischemic cardiomyopathy: Secondary | ICD-10-CM | POA: Diagnosis present

## 2017-01-27 DIAGNOSIS — Z803 Family history of malignant neoplasm of breast: Secondary | ICD-10-CM

## 2017-01-27 DIAGNOSIS — N200 Calculus of kidney: Secondary | ICD-10-CM | POA: Diagnosis not present

## 2017-01-27 DIAGNOSIS — K529 Noninfective gastroenteritis and colitis, unspecified: Secondary | ICD-10-CM

## 2017-01-27 DIAGNOSIS — R1032 Left lower quadrant pain: Secondary | ICD-10-CM | POA: Diagnosis not present

## 2017-01-27 DIAGNOSIS — Z79890 Hormone replacement therapy (postmenopausal): Secondary | ICD-10-CM

## 2017-01-27 DIAGNOSIS — F419 Anxiety disorder, unspecified: Secondary | ICD-10-CM | POA: Diagnosis present

## 2017-01-27 DIAGNOSIS — G629 Polyneuropathy, unspecified: Secondary | ICD-10-CM | POA: Diagnosis present

## 2017-01-27 DIAGNOSIS — Z888 Allergy status to other drugs, medicaments and biological substances status: Secondary | ICD-10-CM

## 2017-01-27 DIAGNOSIS — I5022 Chronic systolic (congestive) heart failure: Secondary | ICD-10-CM | POA: Diagnosis present

## 2017-01-27 DIAGNOSIS — Z800 Family history of malignant neoplasm of digestive organs: Secondary | ICD-10-CM | POA: Diagnosis not present

## 2017-01-27 DIAGNOSIS — Z955 Presence of coronary angioplasty implant and graft: Secondary | ICD-10-CM

## 2017-01-27 DIAGNOSIS — Z833 Family history of diabetes mellitus: Secondary | ICD-10-CM

## 2017-01-27 DIAGNOSIS — Z7982 Long term (current) use of aspirin: Secondary | ICD-10-CM

## 2017-01-27 DIAGNOSIS — E860 Dehydration: Secondary | ICD-10-CM

## 2017-01-27 DIAGNOSIS — N183 Chronic kidney disease, stage 3 (moderate): Secondary | ICD-10-CM | POA: Diagnosis present

## 2017-01-27 DIAGNOSIS — I252 Old myocardial infarction: Secondary | ICD-10-CM

## 2017-01-27 DIAGNOSIS — Z8349 Family history of other endocrine, nutritional and metabolic diseases: Secondary | ICD-10-CM

## 2017-01-27 DIAGNOSIS — E039 Hypothyroidism, unspecified: Secondary | ICD-10-CM | POA: Diagnosis present

## 2017-01-27 DIAGNOSIS — I130 Hypertensive heart and chronic kidney disease with heart failure and stage 1 through stage 4 chronic kidney disease, or unspecified chronic kidney disease: Secondary | ICD-10-CM | POA: Diagnosis present

## 2017-01-27 DIAGNOSIS — G8929 Other chronic pain: Secondary | ICD-10-CM | POA: Diagnosis present

## 2017-01-27 DIAGNOSIS — Z8041 Family history of malignant neoplasm of ovary: Secondary | ICD-10-CM

## 2017-01-27 DIAGNOSIS — N179 Acute kidney failure, unspecified: Secondary | ICD-10-CM | POA: Diagnosis present

## 2017-01-27 DIAGNOSIS — Z9049 Acquired absence of other specified parts of digestive tract: Secondary | ICD-10-CM

## 2017-01-27 DIAGNOSIS — E785 Hyperlipidemia, unspecified: Secondary | ICD-10-CM | POA: Diagnosis present

## 2017-01-27 DIAGNOSIS — Z87891 Personal history of nicotine dependence: Secondary | ICD-10-CM

## 2017-01-27 DIAGNOSIS — Z8673 Personal history of transient ischemic attack (TIA), and cerebral infarction without residual deficits: Secondary | ICD-10-CM

## 2017-01-27 DIAGNOSIS — K279 Peptic ulcer, site unspecified, unspecified as acute or chronic, without hemorrhage or perforation: Secondary | ICD-10-CM | POA: Diagnosis not present

## 2017-01-27 DIAGNOSIS — I2510 Atherosclerotic heart disease of native coronary artery without angina pectoris: Secondary | ICD-10-CM | POA: Diagnosis present

## 2017-01-27 DIAGNOSIS — G35 Multiple sclerosis: Secondary | ICD-10-CM | POA: Diagnosis present

## 2017-01-27 DIAGNOSIS — E1122 Type 2 diabetes mellitus with diabetic chronic kidney disease: Secondary | ICD-10-CM | POA: Diagnosis present

## 2017-01-27 DIAGNOSIS — Z951 Presence of aortocoronary bypass graft: Secondary | ICD-10-CM

## 2017-01-27 DIAGNOSIS — Z818 Family history of other mental and behavioral disorders: Secondary | ICD-10-CM

## 2017-01-27 DIAGNOSIS — Z85820 Personal history of malignant melanoma of skin: Secondary | ICD-10-CM

## 2017-01-27 DIAGNOSIS — Z90721 Acquired absence of ovaries, unilateral: Secondary | ICD-10-CM

## 2017-01-27 DIAGNOSIS — K5150 Left sided colitis without complications: Secondary | ICD-10-CM | POA: Diagnosis not present

## 2017-01-27 DIAGNOSIS — Z823 Family history of stroke: Secondary | ICD-10-CM

## 2017-01-27 DIAGNOSIS — E119 Type 2 diabetes mellitus without complications: Secondary | ICD-10-CM | POA: Diagnosis not present

## 2017-01-27 DIAGNOSIS — Z841 Family history of disorders of kidney and ureter: Secondary | ICD-10-CM

## 2017-01-27 DIAGNOSIS — R197 Diarrhea, unspecified: Secondary | ICD-10-CM | POA: Diagnosis not present

## 2017-01-27 HISTORY — DX: Noninfective gastroenteritis and colitis, unspecified: K52.9

## 2017-01-27 LAB — CBC WITH DIFFERENTIAL/PLATELET
BASOS ABS: 0.1 10*3/uL (ref 0–0.1)
Basophils Relative: 1 %
EOS ABS: 0.4 10*3/uL (ref 0–0.7)
EOS PCT: 3 %
HCT: 42.2 % (ref 35.0–47.0)
HEMOGLOBIN: 14 g/dL (ref 12.0–16.0)
LYMPHS ABS: 2.9 10*3/uL (ref 1.0–3.6)
LYMPHS PCT: 23 %
MCH: 27.8 pg (ref 26.0–34.0)
MCHC: 33.2 g/dL (ref 32.0–36.0)
MCV: 83.7 fL (ref 80.0–100.0)
Monocytes Absolute: 0.6 10*3/uL (ref 0.2–0.9)
Monocytes Relative: 5 %
NEUTROS PCT: 68 %
Neutro Abs: 8.6 10*3/uL — ABNORMAL HIGH (ref 1.4–6.5)
PLATELETS: 247 10*3/uL (ref 150–440)
RBC: 5.04 MIL/uL (ref 3.80–5.20)
RDW: 13.8 % (ref 11.5–14.5)
WBC: 12.7 10*3/uL — AB (ref 3.6–11.0)

## 2017-01-27 LAB — URINALYSIS, COMPLETE (UACMP) WITH MICROSCOPIC
BACTERIA UA: NONE SEEN
BILIRUBIN URINE: NEGATIVE
GLUCOSE, UA: NEGATIVE mg/dL
HGB URINE DIPSTICK: NEGATIVE
KETONES UR: NEGATIVE mg/dL
NITRITE: NEGATIVE
PH: 5 (ref 5.0–8.0)
Protein, ur: 100 mg/dL — AB
SPECIFIC GRAVITY, URINE: 1.024 (ref 1.005–1.030)

## 2017-01-27 LAB — COMPREHENSIVE METABOLIC PANEL
ALBUMIN: 4.3 g/dL (ref 3.5–5.0)
ALT: 20 U/L (ref 14–54)
ANION GAP: 8 (ref 5–15)
AST: 21 U/L (ref 15–41)
Alkaline Phosphatase: 138 U/L — ABNORMAL HIGH (ref 38–126)
BUN: 25 mg/dL — ABNORMAL HIGH (ref 6–20)
CO2: 21 mmol/L — AB (ref 22–32)
Calcium: 9.2 mg/dL (ref 8.9–10.3)
Chloride: 109 mmol/L (ref 101–111)
Creatinine, Ser: 2.07 mg/dL — ABNORMAL HIGH (ref 0.44–1.00)
GFR calc non Af Amer: 27 mL/min — ABNORMAL LOW (ref >60)
GFR, EST AFRICAN AMERICAN: 32 mL/min — AB (ref >60)
GLUCOSE: 124 mg/dL — AB (ref 65–99)
POTASSIUM: 3.7 mmol/L (ref 3.5–5.1)
SODIUM: 138 mmol/L (ref 135–145)
TOTAL PROTEIN: 7.8 g/dL (ref 6.5–8.1)
Total Bilirubin: 0.8 mg/dL (ref 0.3–1.2)

## 2017-01-27 LAB — PROTIME-INR
INR: 1.22
PROTHROMBIN TIME: 15.3 s — AB (ref 11.4–15.2)

## 2017-01-27 LAB — LACTIC ACID, PLASMA
LACTIC ACID, VENOUS: 1.4 mmol/L (ref 0.5–1.9)
Lactic Acid, Venous: 1.3 mmol/L (ref 0.5–1.9)

## 2017-01-27 MED ORDER — SODIUM CHLORIDE 0.9% FLUSH
3.0000 mL | Freq: Two times a day (BID) | INTRAVENOUS | Status: DC
  Administered 2017-01-27 – 2017-01-29 (×3): 3 mL via INTRAVENOUS

## 2017-01-27 MED ORDER — INSULIN ASPART 100 UNIT/ML ~~LOC~~ SOLN: 0.0000 [IU] | Freq: Every day | SUBCUTANEOUS | Status: DC

## 2017-01-27 MED ORDER — GABAPENTIN 600 MG PO TABS
600.0000 mg | ORAL_TABLET | Freq: Two times a day (BID) | ORAL | Status: DC
  Administered 2017-01-27 – 2017-01-28 (×2): 600 mg via ORAL
  Filled 2017-01-27 (×2): qty 1

## 2017-01-27 MED ORDER — INSULIN ASPART 100 UNIT/ML ~~LOC~~ SOLN
0.0000 [IU] | Freq: Three times a day (TID) | SUBCUTANEOUS | Status: DC
  Administered 2017-01-28 (×2): 2 [IU] via SUBCUTANEOUS
  Administered 2017-01-29: 3 [IU] via SUBCUTANEOUS
  Filled 2017-01-27 (×3): qty 1

## 2017-01-27 MED ORDER — METOPROLOL SUCCINATE ER 25 MG PO TB24
25.0000 mg | ORAL_TABLET | Freq: Every day | ORAL | Status: DC
  Administered 2017-01-28: 25 mg via ORAL
  Filled 2017-01-27: qty 1

## 2017-01-27 MED ORDER — ZOLPIDEM TARTRATE 5 MG PO TABS: 5.0000 mg | ORAL_TABLET | Freq: Every evening | ORAL | Status: DC | PRN

## 2017-01-27 MED ORDER — NITROGLYCERIN 0.4 MG SL SUBL: 0.4000 mg | SUBLINGUAL_TABLET | SUBLINGUAL | Status: DC | PRN

## 2017-01-27 MED ORDER — TIZANIDINE HCL 4 MG PO TABS
4.0000 mg | ORAL_TABLET | Freq: Two times a day (BID) | ORAL | Status: DC | PRN
  Administered 2017-01-27: 4 mg via ORAL
  Filled 2017-01-27 (×2): qty 1

## 2017-01-27 MED ORDER — EZETIMIBE 10 MG PO TABS
10.0000 mg | ORAL_TABLET | Freq: Every day | ORAL | Status: DC
  Administered 2017-01-27 – 2017-01-28 (×2): 10 mg via ORAL
  Filled 2017-01-27 (×3): qty 1

## 2017-01-27 MED ORDER — MORPHINE SULFATE (PF) 4 MG/ML IV SOLN
4.0000 mg | INTRAVENOUS | Status: DC | PRN
  Administered 2017-01-27 – 2017-01-28 (×4): 4 mg via INTRAVENOUS
  Filled 2017-01-27 (×4): qty 1

## 2017-01-27 MED ORDER — OXYCODONE-ACETAMINOPHEN 5-325 MG PO TABS
1.0000 | ORAL_TABLET | Freq: Four times a day (QID) | ORAL | Status: DC | PRN
  Administered 2017-01-28: 1 via ORAL
  Filled 2017-01-27: qty 1

## 2017-01-27 MED ORDER — SODIUM CHLORIDE 0.9 % IV BOLUS (SEPSIS)
1000.0000 mL | Freq: Once | INTRAVENOUS | Status: AC
  Administered 2017-01-27: 1000 mL via INTRAVENOUS

## 2017-01-27 MED ORDER — METOPROLOL TARTRATE 5 MG/5ML IV SOLN: 5.0000 mg | Freq: Three times a day (TID) | INTRAVENOUS | Status: DC | PRN

## 2017-01-27 MED ORDER — LEVOTHYROXINE SODIUM 50 MCG PO TABS
25.0000 ug | ORAL_TABLET | Freq: Every day | ORAL | Status: DC
  Administered 2017-01-28 – 2017-01-29 (×2): 25 ug via ORAL
  Filled 2017-01-27 (×2): qty 1

## 2017-01-27 MED ORDER — ALPRAZOLAM 0.25 MG PO TABS: 0.2500 mg | ORAL_TABLET | Freq: Four times a day (QID) | ORAL | Status: DC | PRN

## 2017-01-27 MED ORDER — ISOSORBIDE MONONITRATE ER 30 MG PO TB24
60.0000 mg | ORAL_TABLET | Freq: Every day | ORAL | Status: DC
  Administered 2017-01-28 – 2017-01-29 (×2): 60 mg via ORAL
  Filled 2017-01-27 (×2): qty 2

## 2017-01-27 MED ORDER — CITALOPRAM HYDROBROMIDE 20 MG PO TABS
40.0000 mg | ORAL_TABLET | Freq: Every day | ORAL | Status: DC
  Administered 2017-01-28 – 2017-01-29 (×2): 40 mg via ORAL
  Filled 2017-01-27 (×2): qty 2

## 2017-01-27 MED ORDER — PROMETHAZINE HCL 25 MG/ML IJ SOLN
12.5000 mg | Freq: Four times a day (QID) | INTRAMUSCULAR | Status: DC | PRN
  Administered 2017-01-27 – 2017-01-29 (×3): 12.5 mg via INTRAVENOUS
  Filled 2017-01-27 (×3): qty 1

## 2017-01-27 MED ORDER — PIPERACILLIN-TAZOBACTAM 3.375 G IVPB: 3.3750 g | Freq: Three times a day (TID) | INTRAVENOUS | Status: DC

## 2017-01-27 MED ORDER — ASPIRIN EC 81 MG PO TBEC
81.0000 mg | DELAYED_RELEASE_TABLET | Freq: Every day | ORAL | Status: DC
  Administered 2017-01-28 – 2017-01-29 (×2): 81 mg via ORAL
  Filled 2017-01-27 (×2): qty 1

## 2017-01-27 MED ORDER — DILTIAZEM HCL ER COATED BEADS 120 MG PO CP24
120.0000 mg | ORAL_CAPSULE | Freq: Every day | ORAL | Status: DC
  Administered 2017-01-28 – 2017-01-29 (×2): 120 mg via ORAL
  Filled 2017-01-27 (×2): qty 1

## 2017-01-27 MED ORDER — ROSUVASTATIN CALCIUM 10 MG PO TABS
40.0000 mg | ORAL_TABLET | Freq: Every day | ORAL | Status: DC
  Administered 2017-01-28: 40 mg via ORAL
  Filled 2017-01-27: qty 4

## 2017-01-27 MED ORDER — HEPARIN SODIUM (PORCINE) 5000 UNIT/ML IJ SOLN
5000.0000 [IU] | Freq: Three times a day (TID) | INTRAMUSCULAR | Status: DC
  Administered 2017-01-27 – 2017-01-29 (×5): 5000 [IU] via SUBCUTANEOUS
  Filled 2017-01-27 (×5): qty 1

## 2017-01-27 MED ORDER — TRAZODONE HCL 100 MG PO TABS
200.0000 mg | ORAL_TABLET | Freq: Every day | ORAL | Status: DC
  Administered 2017-01-27 – 2017-01-28 (×2): 200 mg via ORAL
  Filled 2017-01-27 (×2): qty 2

## 2017-01-27 MED ORDER — PIPERACILLIN-TAZOBACTAM 3.375 G IVPB 30 MIN
3.3750 g | Freq: Once | INTRAVENOUS | Status: AC
  Administered 2017-01-27: 3.375 g via INTRAVENOUS
  Filled 2017-01-27: qty 50

## 2017-01-27 MED ORDER — LACTINEX PO CHEW
1.0000 | CHEWABLE_TABLET | Freq: Three times a day (TID) | ORAL | Status: DC
  Filled 2017-01-27 (×2): qty 1

## 2017-01-27 MED ORDER — LOSARTAN POTASSIUM 50 MG PO TABS
50.0000 mg | ORAL_TABLET | Freq: Every day | ORAL | Status: DC
  Administered 2017-01-28: 50 mg via ORAL
  Filled 2017-01-27: qty 1

## 2017-01-27 MED ORDER — CLOPIDOGREL BISULFATE 75 MG PO TABS
75.0000 mg | ORAL_TABLET | Freq: Every day | ORAL | Status: DC
  Administered 2017-01-28 – 2017-01-29 (×2): 75 mg via ORAL
  Filled 2017-01-27 (×2): qty 1

## 2017-01-27 MED ORDER — METRONIDAZOLE 500 MG PO TABS
500.0000 mg | ORAL_TABLET | Freq: Three times a day (TID) | ORAL | Status: DC
  Administered 2017-01-27 – 2017-01-29 (×5): 500 mg via ORAL
  Filled 2017-01-27 (×7): qty 1

## 2017-01-27 MED ORDER — PANTOPRAZOLE SODIUM 40 MG PO TBEC
40.0000 mg | DELAYED_RELEASE_TABLET | Freq: Two times a day (BID) | ORAL | Status: DC
  Administered 2017-01-27 – 2017-01-29 (×4): 40 mg via ORAL
  Filled 2017-01-27 (×4): qty 1

## 2017-01-27 MED ORDER — LACTATED RINGERS IV SOLN
INTRAVENOUS | Status: DC
  Administered 2017-01-27 – 2017-01-29 (×3): via INTRAVENOUS

## 2017-01-27 MED ORDER — CIPROFLOXACIN IN D5W 400 MG/200ML IV SOLN
400.0000 mg | Freq: Two times a day (BID) | INTRAVENOUS | Status: DC
  Administered 2017-01-27 – 2017-01-29 (×4): 400 mg via INTRAVENOUS
  Filled 2017-01-27 (×5): qty 200

## 2017-01-27 NOTE — ED Triage Notes (Signed)
Pt c/o lower abd pain that radiates around to the lower back with N/V/D that started a week ago, states she was seen 12/21 with same sx and dx with colitis. Pt states her sx have not changes but is now having dark colored urine.Marland Kitchen

## 2017-01-27 NOTE — Progress Notes (Signed)
ANTIBIOTIC CONSULT NOTE - INITIAL  Pharmacy Consult for Zosyn Indication: IAI  Allergies  Allergen Reactions  . Tramadol     Mouth feels like it's on fire    Patient Measurements: Height: '5\' 2"'  (157.5 cm) Weight: 163 lb (73.9 kg) IBW/kg (Calculated) : 50.1 Adjusted Body Weight:   Vital Signs: Temp: 98.3 F (36.8 C) (12/26 1352) Temp Source: Oral (12/26 1352) BP: 181/114 (12/26 1352) Pulse Rate: 126 (12/26 1352) Intake/Output from previous day: No intake/output data recorded. Intake/Output from this shift: No intake/output data recorded.  Labs: Recent Labs    01/27/17 1402  WBC 12.7*  HGB 14.0  PLT 247  CREATININE 2.07*   Estimated Creatinine Clearance: 31.3 mL/min (A) (by C-G formula based on SCr of 2.07 mg/dL (H)). No results for input(s): VANCOTROUGH, VANCOPEAK, VANCORANDOM, GENTTROUGH, GENTPEAK, GENTRANDOM, TOBRATROUGH, TOBRAPEAK, TOBRARND, AMIKACINPEAK, AMIKACINTROU, AMIKACIN in the last 72 hours.   Microbiology: No results found for this or any previous visit (from the past 720 hour(s)).  Medical History: Past Medical History:  Diagnosis Date  . Anemia    iron deficiency anemia  . Anxiety   . Aortic arch aneurysm ([place])   . Arthritis   . Bipolar disorder ([place])   . BRCA negative 2014  . CAD (coronary artery disease)    a. cath 08/2003 nonobs dzs;  b. 11/2014: mLAD 85-90% s/p PCI/DES (Xience 3.25 x 23); c. cath 04/2015: 70% mD1 @ bifurcation. Patent LAD stent - > Med Rx.; d. Cath-PCI 5/17: Patent LAD stent, D1 now ~80% - PCI Resolute DES 2.25 x 12.    . Chronic systolic CHF (congestive heart failure) ([place])    a. echo 01/29/2013 EF 40-45%, basal anteroseptal & mid anteroseptal segments abnl; b echo 05/2012 EF 50-55%, mildly dilated LA, nl RVSP; c. 10/2015 Echo: EF 55-60%, mild conc LVH, no rwma.  . CKD (chronic kidney disease), stage IV ([place])   . Colon polyp   . CVA (cerebral vascular accident) ([place])    a. 06/2008-TEE nl LV fxn; b. 01/2013 - notes indicate  short run of SVT at that time  . Depression   . Diabetes mellitus without complication ([place])   . Family history of breast cancer    BRCA neg 2014  . Gastric ulcer 04/27/2011  . GERD (gastroesophageal reflux disease)   . Headache    Migraines  . Hyperlipidemia   . Hypertension   . Hypertensive heart disease   . Hypothyroidism   . Ischemic cardiomyopathy    a. EF prev 40-45% in 2014-->Improved to 55-60% (10/2015).  . Malignant melanoma of skin of scalp ([place])   . MI, acute, non ST segment elevation ([place])   . Morbid obesity ([place])    a. s/p gastric bypass.  . Multiple sclerosis ([place]) 2015  . Myocardial infarction ([place]) 06/03/2015  . Neuromuscular disorder ([place])   . Neuropathy   . Orthostatic hypotension   . S/P drug eluting coronary stent placement 06/04/2015  . Syncope     Medications:  Infusions:  . piperacillin-tazobactam    . piperacillin-tazobactam (ZOSYN)  IV    . sodium chloride     Assessment: 89 yof with IAI. Pharmacy consulted to dose Zosyn.  Goal of Therapy:  Resolve infection Prevent ADE  Plan:  Zosyn 3.375 gm IV Q8H EI  Laural Benes, Pharm.D., BCPS Clinical Pharmacist 01/27/2017,4:07 PM

## 2017-01-27 NOTE — ED Provider Notes (Signed)
[facility] Emergency Department Provider Note    First MD Initiated Contact with Patient 01/27/17 [phone]     (approximate)  I have reviewed the triage vital signs and the nursing notes.   HISTORY  Chief Complaint Abdominal Pain    HPI [name] [address] [name] is a 46 y.o. female presents to the ER for persistent lower abdominal pain associated with malaise chills and nausea.  Patient was seen in the ER several days ago and started on antibiotics after diagnosis of colitis.  States that she feels that symptoms have worsened.  She is been compliant with her antibiotics.  States that she had a normal colonoscopy last year.  Denies any use of blood thinners.  No trauma.  No bloody stools.  No dysuria, cough, shortness of breath.  Past Medical History:  Diagnosis Date  . Anemia    iron deficiency anemia  . Anxiety   . Aortic arch aneurysm ([place])   . Arthritis   . Bipolar disorder ([place])   . BRCA negative 2014  . CAD (coronary artery disease)    a. cath 08/2003 nonobs dzs;  b. 11/2014: mLAD 85-90% s/p PCI/DES (Xience 3.25 x 23); c. cath 04/2015: 70% mD1 @ bifurcation. Patent LAD stent - > Med Rx.; d. Cath-PCI 5/17: Patent LAD stent, D1 now ~80% - PCI Resolute DES 2.25 x 12.    . Chronic systolic CHF (congestive heart failure) ([place])    a. echo 01/29/2013 EF 40-45%, basal anteroseptal & mid anteroseptal segments abnl; b echo 05/2012 EF 50-55%, mildly dilated LA, nl RVSP; c. 10/2015 Echo: EF 55-60%, mild conc LVH, no rwma.  . CKD (chronic kidney disease), stage IV ([place])   . Colon polyp   . CVA (cerebral vascular accident) ([place])    a. 06/2008-TEE nl LV fxn; b. 01/2013 - notes indicate short run of SVT at that time  . Depression   . Diabetes mellitus without complication ([place])   . Family history of breast cancer    BRCA neg 2014  . Gastric ulcer 04/27/2011  . GERD (gastroesophageal reflux disease)   . Headache    Migraines  . Hyperlipidemia   . Hypertension   .  Hypertensive heart disease   . Hypothyroidism   . Ischemic cardiomyopathy    a. EF prev 40-45% in 2014-->Improved to 55-60% (10/2015).  . Malignant melanoma of skin of scalp ([place])   . MI, acute, non ST segment elevation ([place])   . Morbid obesity ([place])    a. s/p gastric bypass.  . Multiple sclerosis ([place]) 2015  . Myocardial infarction ([place]) 06/03/2015  . Neuromuscular disorder ([place])   . Neuropathy   . Orthostatic hypotension   . S/P drug eluting coronary stent placement 06/04/2015  . Syncope    Family History  Problem Relation Age of Onset  . Hypertension Mother   . Anxiety disorder Mother   . Depression Mother   . Bipolar disorder Mother   . Heart disease Mother   . Hyperlipidemia Mother   . Kidney disease Father   . Heart disease Father   . Hypertension Father   . Diabetes Father   . Stroke Father   . Colon cancer Father        dx in his 9's  . Anxiety disorder Father   . Depression Father   . Skin cancer Father   . Kidney disease Sister   . Thyroid nodules Sister   . Hypertension Sister   . Hypertension Sister   .  Diabetes Sister   . Hyperlipidemia Sister   . Depression Sister   . Breast cancer Maternal Aunt 82  . Breast cancer Maternal Aunt 6  . Ovarian cancer Cousin   . Colon cancer Cousin   . Breast cancer Other   . Kidney cancer Neg Hx   . Bladder Cancer Neg Hx    Past Surgical History:  Procedure Laterality Date  . APPENDECTOMY    . CARDIAC CATHETERIZATION N/A 11/09/2014   Procedure: Coronary Angiography;  Surgeon: Minna Merritts, MD;  Location: [place] [facility];  Service: Cardiovascular;  Laterality: N/A;  . CARDIAC CATHETERIZATION N/A 11/12/2014   Procedure: Coronary Stent Intervention;  Surgeon: Isaias Cowman, MD;  Location: [place] [facility];  Service: Cardiovascular;  Laterality: N/A;  . CARDIAC CATHETERIZATION N/A 04/18/2015   Procedure: Left Heart Cath and Coronary Angiography;  Surgeon: Minna Merritts, MD;  Location: [place]  [facility];  Service: Cardiovascular;  Laterality: N/A;  . CARDIAC CATHETERIZATION Left 06/04/2015   Procedure: Left Heart Cath and Coronary Angiography;  Surgeon: Wellington Hampshire, MD;  Location: [facility];  Service: Cardiovascular;  Laterality: Left;  . CARDIAC CATHETERIZATION N/A 06/04/2015   Procedure: Coronary Stent Intervention;  Surgeon: Wellington Hampshire, MD;  Location: [place] [facility];  Service: Cardiovascular;  Laterality: N/A;  . CESAREAN SECTION  2001  . CHOLECYSTECTOMY N/A 11/18/2016   Procedure: LAPAROSCOPIC CHOLECYSTECTOMY WITH INTRAOPERATIVE CHOLANGIOGRAM;  Surgeon: Christene Lye, MD;  Location: [facility];  Service: General;  Laterality: N/A;  . COLONOSCOPY WITH PROPOFOL N/A 04/27/2016   Procedure: COLONOSCOPY WITH PROPOFOL;  Surgeon: [name], MD;  Location: [place];  Service: Endoscopy;  Laterality: N/A;  . CORONARY ANGIOPLASTY    . DILATION AND CURETTAGE OF UTERUS    . ESOPHAGOGASTRODUODENOSCOPY (EGD) WITH PROPOFOL N/A 09/14/2014   Procedure: ESOPHAGOGASTRODUODENOSCOPY (EGD) WITH PROPOFOL;  Surgeon: Josefine Class, MD;  Location: [facility] ENDOSCOPY;  Service: Endoscopy;  Laterality: N/A;  . ESOPHAGOGASTRODUODENOSCOPY (EGD) WITH PROPOFOL N/A 04/27/2016   Procedure: ESOPHAGOGASTRODUODENOSCOPY (EGD) WITH PROPOFOL;  Surgeon: [name], MD;  Location: [place];  Service: Endoscopy;  Laterality: N/A;  Diabetic - oral meds  . GASTRIC BYPASS  09/2009   [facility]   . Left Carotid to sublcavian artery bypass w/ subclavian artery ligation     a. Performed @ [place].  . MELANOMA EXCISION  2016   Dr. Evorn Gong  . [place]  2002  . RIGHT OOPHORECTOMY    . SHOULDER ARTHROSCOPY WITH OPEN ROTATOR CUFF REPAIR Right 01/07/2016   Procedure: SHOULDER ARTHROSCOPY WITH DEBRIDMENT, SUBACHROMIAL DECOMPRESSION;  Surgeon: Corky Mull, MD;  Location: [facility] OR[facility];  Service: Orthopedics;  Laterality: Right;  . TRIGGER FINGER RELEASE Right       [place]   Patient Active Problem List   Diagnosis Date Noted  . Hx of colonic polyps   . Abdominal pain, epigastric   . Bariatric surgery status   . Sinus tachycardia 02/27/2016  . Hypokalemia 12/14/2015  . H/O medication noncompliance 12/14/2015  . Acute pyelonephritis   . Diaphoresis   . Emesis   . CKD (chronic kidney disease), stage IV ([place])   . CAD (coronary artery disease)   . Hypertensive heart disease   . CKD (chronic kidney disease) stage 3, GFR 30-59 ml/min (HCC) 06/05/2015  . Presence of drug coated stent in LAD coronary artery & now in D1 06/04/2015  . Coronary artery disease involving native coronary artery of native  heart with angina pectoris with documented spasm ([place])   . Colitis 06/03/2015  . Carotid stenosis 04/30/2015  . Type 2 diabetes mellitus with complication, without long-term current use of insulin ([place])   . Atypical chest pain   . Iron deficiency anemia 03/22/2015  . Depression 02/22/2015  . Vitamin B12 deficiency 02/18/2015  . Non compliance w medication regimen 02/18/2015  . Misuse of medications for pain 02/18/2015  . Severe recurrent major depression without psychotic features ([place]) 02/15/2015  . Helicobacter pylori infection 11/23/2014  . Hemiparesis, left ([place]) 11/23/2014  . Benign neoplasm of colon 11/20/2014  . Malignant melanoma ([place]) 08/25/2014  . Chronic systolic CHF (congestive heart failure) ([place])   . Incomplete bladder emptying 07/12/2014  . Adult hypothyroidism 12/30/2013  . Aberrant subclavian artery 11/17/2013  . Multiple sclerosis ([place]) 11/02/2013  . History of CVA (cerebrovascular accident) 06/20/2013  . Headache, migraine 05/29/2013  . Hyperlipidemia   . GERD (gastroesophageal reflux disease)   . Neuropathy ([place]) 01/02/2011  . CVA (cerebral vascular accident) ([place]) 06/21/2008  . Essential hypertension 05/01/2008      Prior to Admission medications   Medication Sig Start Date End Date Taking? Authorizing Provider   ALPRAZolam Duanne Moron) 0.5 MG tablet Take 0.5-1 tablets (0.25-0.5 mg total) by mouth every 6 (six) hours as needed for anxiety. 01/08/17   [name], MD  aspirin EC 81 MG tablet Take 1 tablet (81 mg total) by mouth daily. 02/19/15   Hildred Priest, MD  ciprofloxacin (CIPRO) 500 MG tablet Take 1 tablet (500 mg total) by mouth 2 (two) times daily for 10 days. 01/22/17 02/01/17  Earleen Newport, MD  citalopram (CELEXA) 40 MG tablet Take 1 tablet (40 mg total) by mouth daily. 01/13/17   [name], MD  clopidogrel (PLAVIX) 75 MG tablet Take 1 tablet (75 mg total) by mouth daily. 02/27/16   Minna Merritts, MD  diltiazem (CARDIZEM CD) 120 MG 24 hr capsule Take 1 capsule (120 mg total) by mouth daily. 01/31/16   Minna Merritts, MD  ezetimibe (ZETIA) 10 MG tablet Take 1 tablet (10 mg total) daily by mouth. 12/09/16   [name], MD  gabapentin (NEURONTIN) 600 MG tablet Take one every morning and two every evening 12/18/16   [name], MD  isosorbide mononitrate (IMDUR) 60 MG 24 hr tablet Take 1 tablet (60 mg total) by mouth daily. 12/19/15   Minna Merritts, MD  levothyroxine (SYNTHROID, LEVOTHROID) 25 MCG tablet Take 1 tablet (25 mcg total) daily before breakfast by mouth. 12/18/16   [name], MD  losartan (COZAAR) 50 MG tablet Take 1 tablet (50 mg total) by mouth daily. Patient taking differently: Take 100 mg by mouth daily.  01/14/16   [name], MD  metFORMIN (GLUCOPHAGE) 1000 MG tablet TAKE ONE TABLET BY MOUTH TWICE DAILY 03/19/16   [name], MD  metoprolol succinate (TOPROL-XL) 25 MG 24 hr tablet Take 1 tablet (25 mg total) by mouth 2 (two) times daily. Take with or immediately following a meal. Patient taking differently: Take 25 mg by mouth See admin instructions. Take with or immediately following a takes 1 tablet daily and another dose only if needed for tachycardia 09/25/16   Gollan, Kathlene November, MD  metroNIDAZOLE (FLAGYL) 500 MG  tablet Take 1 tablet (500 mg total) by mouth 3 (three) times daily. 01/22/17   Earleen Newport, MD  naproxen sodium (ANAPROX) 220 MG tablet Take 440 mg by mouth daily as  needed (PAIN).    [provider]  nitroGLYCERIN (NITROSTAT) 0.4 MG SL tablet Place 1 tablet (0.4 mg total) under the tongue every 5 (five) minutes as needed for chest pain. 04/30/15   Minna Merritts, MD  ondansetron (ZOFRAN ODT) 4 MG disintegrating tablet Take 1 tablet (4 mg total) by mouth every 8 (eight) hours as needed for nausea or vomiting. 01/22/17   Earleen Newport, MD  oxyCODONE-acetaminophen (PERCOCET) 5-325 MG tablet Take 1-2 tablets by mouth every 6 (six) hours as needed. 01/22/17   Earleen Newport, MD  pantoprazole (PROTONIX) 40 MG tablet Take 1 tablet (40 mg total) by mouth 2 (two) times daily. 05/11/16   [name], MD  promethazine (PHENERGAN) 25 MG tablet Take 1 tablet (25 mg total) by mouth every 6 (six) hours as needed for nausea or vomiting. Reported on 05/31/2015 10/14/16   [name], MD  rosuvastatin (CRESTOR) 40 MG tablet Take 1 tablet (40 mg total) daily at 6 PM by mouth. 12/09/16   [name], MD  tiZANidine (ZANAFLEX) 4 MG tablet Take 4 mg by mouth 2 (two) times daily as needed (migraines).    [provider]  traZODone (DESYREL) 100 MG tablet Take 2 tablets (200 mg total) by mouth at bedtime. 12/02/15   [name], MD  zolpidem (AMBIEN) 5 MG tablet Take 1-2 tablets (5-10 mg total) by mouth at bedtime as needed for sleep. 11/28/16   [name], MD    Allergies Tramadol    Social History Social History   Tobacco Use  . Smoking status: Former Smoker    Types: Cigarettes    Last attempt to quit: 08/31/1994    Years since quitting: 22.4  . Smokeless tobacco: Never Used  . Tobacco comment: quit 28 years ago  Substance Use Topics  . Alcohol use: No    Alcohol/week: 0.0 oz  . Drug use: No    Review of Systems Patient denies headaches,  rhinorrhea, blurry vision, numbness, shortness of breath, chest pain, edema, cough, abdominal pain, nausea, vomiting, diarrhea, dysuria, fevers, rashes or hallucinations unless otherwise stated above in HPI. ____________________________________________   PHYSICAL EXAM:  VITAL SIGNS: Vitals:   01/27/17 1352  BP: (!) 181/114  Pulse: (!) 126  Resp: 18  Temp: 98.3 F (36.8 C)  SpO2: 98%    Constitutional: Alert and oriented. Well appearing and in no acute distress. Eyes: Conjunctivae are normal.  Head: Atraumatic. Nose: No congestion/rhinnorhea. Mouth/Throat: Mucous membranes are moist.   Neck: No stridor. Painless ROM.  Cardiovascular: Normal rate, regular rhythm. Grossly normal heart sounds.  Good peripheral circulation. Respiratory: Normal respiratory effort.  No retractions. Lungs CTAB. Gastrointestinal: Soft with diffuse lower abd ttp. No distention. No abdominal bruits. No CVA tenderness. Musculoskeletal: No lower extremity tenderness nor edema.  No joint effusions. Neurologic:  Normal speech and language. No gross focal neurologic deficits are appreciated. No facial droop Skin:  Skin is warm, dry and intact. No rash noted. Psychiatric: Mood and affect are normal. Speech and behavior are normal.  ____________________________________________   LABS (all labs ordered are listed, but only abnormal results are displayed)  Results for orders placed or performed during the hospital encounter of 01/27/17 (from the past 24 hour(s))  Comprehensive metabolic panel     Status: Abnormal   Collection Time: 01/27/17  2:02 PM  Result Value Ref Range   Sodium 138 135 - 145 mmol/L   Potassium 3.7 3.5 - 5.1 mmol/L   Chloride  109 101 - 111 mmol/L   CO2 21 (L) 22 - 32 mmol/L   Glucose, Bld 124 (H) 65 - 99 mg/dL   BUN 25 (H) 6 - 20 mg/dL   Creatinine, Ser 2.07 (H) 0.44 - 1.00 mg/dL   Calcium 9.2 8.9 - 10.3 mg/dL   Total Protein 7.8 6.5 - 8.1 g/dL   Albumin 4.3 3.5 - 5.0 g/dL   AST 21  15 - 41 U/L   ALT 20 14 - 54 U/L   Alkaline Phosphatase 138 (H) 38 - 126 U/L   Total Bilirubin 0.8 0.3 - 1.2 mg/dL   GFR calc non Af Amer 27 (L) >60 mL/min   GFR calc Af Amer 32 (L) >60 mL/min   Anion gap 8 5 - 15  Lactic acid, plasma     Status: None   Collection Time: 01/27/17  2:02 PM  Result Value Ref Range   Lactic Acid, Venous 1.3 0.5 - 1.9 mmol/L  CBC with Differential     Status: Abnormal   Collection Time: 01/27/17  2:02 PM  Result Value Ref Range   WBC 12.7 (H) 3.6 - 11.0 K/uL   RBC 5.04 3.80 - 5.20 MIL/uL   Hemoglobin 14.0 12.0 - 16.0 g/dL   HCT 42.2 35.0 - 47.0 %   MCV 83.7 80.0 - 100.0 fL   MCH 27.8 26.0 - 34.0 pg   MCHC 33.2 32.0 - 36.0 g/dL   RDW 13.8 11.5 - 14.5 %   Platelets 247 150 - 440 K/uL   Neutrophils Relative % 68 %   Neutro Abs 8.6 (H) 1.4 - 6.5 K/uL   Lymphocytes Relative 23 %   Lymphs Abs 2.9 1.0 - 3.6 K/uL   Monocytes Relative 5 %   Monocytes Absolute 0.6 0.2 - 0.9 K/uL   Eosinophils Relative 3 %   Eosinophils Absolute 0.4 0 - 0.7 K/uL   Basophils Relative 1 %   Basophils Absolute 0.1 0 - 0.1 K/uL  Protime-INR     Status: Abnormal   Collection Time: 01/27/17  2:02 PM  Result Value Ref Range   Prothrombin Time 15.3 (H) 11.4 - 15.2 seconds   INR 1.22   Urinalysis, Complete w Microscopic     Status: Abnormal   Collection Time: 01/27/17  2:02 PM  Result Value Ref Range   Color, Urine YELLOW (A) YELLOW   APPearance CLOUDY (A) CLEAR   Specific Gravity, Urine 1.024 1.005 - 1.030   pH 5.0 5.0 - 8.0   Glucose, UA NEGATIVE NEGATIVE mg/dL   Hgb urine dipstick NEGATIVE NEGATIVE   Bilirubin Urine NEGATIVE NEGATIVE   Ketones, ur NEGATIVE NEGATIVE mg/dL   Protein, ur 100 (A) NEGATIVE mg/dL   Nitrite NEGATIVE NEGATIVE   Leukocytes, UA TRACE (A) NEGATIVE   RBC / HPF 6-30 0 - 5 RBC/hpf   WBC, UA TOO NUMEROUS TO COUNT 0 - 5 WBC/hpf   Bacteria, UA NONE SEEN NONE SEEN   Squamous Epithelial / LPF 6-30 (A) NONE SEEN   Mucus PRESENT    Budding Yeast  PRESENT    Granular Casts, UA PRESENT    ____________________________________________ ____________________________________________  RADIOLOGY  I personally reviewed all radiographic images ordered to evaluate for the above acute complaints and reviewed radiology reports and findings.  These findings were personally discussed with the patient.  Please see medical record for radiology report.  ____________________________________________   PROCEDURES  Procedure(s) performed:  Procedures    Critical Care performed: no ____________________________________________   INITIAL IMPRESSION /  ASSESSMENT AND PLAN / ED COURSE  Pertinent labs & imaging results that were available during my care of the patient were reviewed by me and considered in my medical decision making (see chart for details).  DDX: colitis, perforation, abscess, appendicitis, diverticulitis, msk strain, uti  Keilah M St [name] is a 46 y.o. who presents to the ED with diffuse abdominal pain, worsening oral intake chills at home.  She is afebrile.  Mildly tachycardic and dehydrated appearing.  Blood work sent for the above differential shows worsening leukocytosis and prerenal azotemia with mild renal insufficiency.  Patient given IV fluids.  Repeat CT imaging ordered due to her worsening abdominal pain to exclude perforation or abscess.  There is no evidence of perforation or abscess.  CT imaging actually looks improved however the patient clinically is deteriorating and failing outpatient management therefore do feel patient would benefit from further medical management in the hospital with IV fluids and IV antibiotics. Have discussed with the patient and available family all diagnostics and treatments performed thus far and all questions were answered to the best of my ability. The patient demonstrates understanding and agreement with plan.        ____________________________________________   FINAL CLINICAL  IMPRESSION(S) / ED DIAGNOSES  Final diagnoses:  Left lower quadrant pain  Dehydration      NEW MEDICATIONS STARTED DURING THIS VISIT:  This SmartLink is deprecated. Use AVSMEDLIST instead to display the medication list for a patient.   Note:  This document was prepared using Dragon voice recognition software and may include unintentional dictation errors.    Merlyn Lot, MD 01/27/17 [phone]

## 2017-01-27 NOTE — ED Notes (Signed)
First Nurse Note:  Complaining of abd. Pain.  Diagnosed with colitis.  Sitting in [place].

## 2017-01-27 NOTE — H&P (Signed)
[facility] [place] [facility]   PATIENT NAME: [name]    MR#:  [id number]  DATE OF BIRTH:  [birth year]  DATE OF ADMISSION:  01/27/2017  PRIMARY CARE PHYSICIAN: [name], MD   REQUESTING/REFERRING PHYSICIAN:   CHIEF COMPLAINT:   Chief Complaint  Patient presents with  . Abdominal Pain    HISTORY OF PRESENT ILLNESS: Kendra [address] Jeoffrey [name]  is a 46 y.o. female with a known history per below presented originally to the emergency room on Friday for nausea/vomiting/diarrhea was found to have colitis on CT of the abdomen noted of the left colon which was mild, patient sent home on Cipro Flagyl, patient returns today 5 days later with continued abdominal pain in the left lower quadrant associated nausea, eructations, patient states that diarrhea resolved 3 days ago, patient describes the pain as sharp radiating to her left flank, in the emergency room patient noted to have white count of 12,000, repeat CT of the abdomen without contrast did not show any acute abnormalities, chest x-ray was negative, UA weakly positive with 100 protein, patient evaluated in the emergency room, no apparent distress, resting comfortably in bed, abdomen appears benign but with distraction, patient now been admitted with acute left-sided acute colitis which is failed outpatient management, noted history for colonoscopy done last year which is normal study.  PAST MEDICAL HISTORY:   Past Medical History:  Diagnosis Date  . Anemia    iron deficiency anemia  . Anxiety   . Aortic arch aneurysm (HCC)   . Arthritis   . Bipolar disorder (HCC)   . BRCA negative 2014  . CAD (coronary artery disease)    a. cath 08/2003 nonobs dzs;  b. 11/2014: mLAD 85-90% s/p PCI/DES (Xience 3.25 x 23); c. cath 04/2015: 70% mD1 @ bifurcation. Patent LAD stent - > Med Rx.; d. Cath-PCI 5/17: Patent LAD stent, D1 now ~80% - PCI Resolute DES 2.25 x 12.    . Chronic systolic CHF (congestive heart failure) (HCC)     a. echo 01/29/2013 EF 40-45%, basal anteroseptal & mid anteroseptal segments abnl; b echo 05/2012 EF 50-55%, mildly dilated LA, nl RVSP; c. 10/2015 Echo: EF 55-60%, mild conc LVH, no rwma.  . CKD (chronic kidney disease), stage IV (HCC)   . Colon polyp   . CVA (cerebral vascular accident) (HCC)    a. 06/2008-TEE nl LV fxn; b. 01/2013 - notes indicate short run of SVT at that time  . Depression   . Diabetes mellitus without complication (HCC)   . Family history of breast cancer    BRCA neg 2014  . Gastric ulcer 04/27/2011  . GERD (gastroesophageal reflux disease)   . Headache    Migraines  . Hyperlipidemia   . Hypertension   . Hypertensive heart disease   . Hypothyroidism   . Ischemic cardiomyopathy    a. EF prev 40-45% in 2014-->Improved to 55-60% (10/2015).  . Malignant melanoma of skin of scalp (HCC)   . MI, acute, non ST segment elevation (HCC)   . Morbid obesity (HCC)    a. s/p gastric bypass.  . Multiple sclerosis (HCC) 2015  . Myocardial infarction (HCC) 06/03/2015  . Neuromuscular disorder (HCC)   . Neuropathy   . Orthostatic hypotension   . S/P drug eluting coronary stent placement 06/04/2015  . Syncope     PAST SURGICAL HISTORY:  Past Surgical History:  Procedure Laterality Date  . APPENDECTOMY    . CARDIAC CATHETERIZATION N/A 11/09/2014  Procedure: Coronary Angiography;  Surgeon: Antonieta Iba, MD;  Location: [facility] INVASIVE CV LAB;  Service: Cardiovascular;  Laterality: N/A;  . CARDIAC CATHETERIZATION N/A 11/12/2014   Procedure: Coronary Stent Intervention;  Surgeon: Marcina Millard, MD;  Location: [facility] INVASIVE CV [facility];  Service: Cardiovascular;  Laterality: N/A;  . CARDIAC CATHETERIZATION N/A 04/18/2015   Procedure: Left Heart Cath and Coronary Angiography;  Surgeon: Antonieta Iba, MD;  Location: [facility] INVASIVE CV LAB;  Service: Cardiovascular;  Laterality: N/A;  . CARDIAC CATHETERIZATION Left 06/04/2015   Procedure: Left Heart Cath and Coronary Angiography;   Surgeon: Iran Ouch, MD;  Location: [facility] INVAS[facility] CV [facility];  Service: Cardiovascular;  Laterality: Left;  . CARDIAC CATHETERIZATION N/A 06/04/2015   Procedure: Coronary Stent Intervention;  Surgeon: Iran Ouch, MD;  Location: [facility] CV [facility];  Service: Cardiovascular;  Laterality: N/A;  . CESAREAN SECTION  2001  . CHOLECYSTECTOMY N/A 11/18/2016   Procedure: LAPAROSCOPIC CHOLECYSTECTOMY WITH INTRAOPERATIVE CHOLANGIOGRAM;  Surgeon: Kieth Brightly, MD;  Location: [facility] ORS;  Service: General;  Laterality: N/A;  . COLONOSCOPY WITH PROPOFOL N/A 04/27/2016   Procedure: COLONOSCOPY WITH PROPOFOL;  Surgeon: Midge Minium, MD;  Location: [facility];  Service: Endoscopy;  Laterality: N/A;  . CORONARY ANGIOPLASTY    . DILATION AND CURETTAGE OF UTERUS    . ESOPHAGOGASTRODUODENOSCOPY (EGD) WITH PROPOFOL N/A 09/14/2014   Procedure: ESOPHAGOGASTRODUODENOSCOPY (EGD) WITH PROPOFOL;  Surgeon: Elnita Maxwell, MD;  Location: [facility] ENDOSCOPY;  Service: Endoscopy;  Laterality: N/A;  . ESOPHAGOGASTRODUODENOSCOPY (EGD) WITH PROPOFOL N/A 04/27/2016   Procedure: ESOPHAGOGASTRODUODENOSCOPY (EGD) WITH PROPOFOL;  Surgeon: Midge Minium, MD;  Location: [facility];  Service: Endoscopy;  Laterality: N/A;  Diabetic - oral meds  . GASTRIC BYPASS  09/2009   [facility]   . Left Carotid to sublcavian artery bypass w/ subclavian artery ligation     a. Performed @ [place].  . MELANOMA EXCISION  2016   Dr. Adolphus Birchwood  . NOVASURE ABLATION  2002  . RIGHT OOPHORECTOMY    . SHOULDER ARTHROSCOPY WITH OPEN ROTATOR CUFF REPAIR Right 01/07/2016   Procedure: SHOULDER ARTHROSCOPY WITH DEBRIDMENT, SUBACHROMIAL DECOMPRESSION;  Surgeon: Christena Flake, MD;  Location: [facility] ORS;  Service: Orthopedics;  Laterality: Right;  . TRIGGER FINGER RELEASE Right     [place]    SOCIAL HISTORY:  Social History   Tobacco Use  . Smoking status: Former Smoker    Types: Cigarettes    Last attempt to  quit: 08/31/1994    Years since quitting: 22.4  . Smokeless tobacco: Never Used  . Tobacco comment: quit 28 years ago  Substance Use Topics  . Alcohol use: No    Alcohol/week: 0.0 oz    FAMILY HISTORY:  Family History  Problem Relation Age of Onset  . Hypertension Mother   . Anxiety disorder Mother   . Depression Mother   . Bipolar disorder Mother   . Heart disease Mother   . Hyperlipidemia Mother   . Kidney disease Father   . Heart disease Father   . Hypertension Father   . Diabetes Father   . Stroke Father   . Colon cancer Father        dx in his 25's  . Anxiety disorder Father   . Depression Father   . Skin cancer Father   . Kidney disease Sister   . Thyroid nodules Sister   . Hypertension Sister   . Hypertension Sister   . Diabetes Sister   .  Hyperlipidemia Sister   . Depression Sister   . Breast cancer Maternal Aunt 50  . Breast cancer Maternal Aunt 50  . Ovarian cancer Cousin   . Colon cancer Cousin   . Breast cancer Other   . Kidney cancer Neg Hx   . Bladder Cancer Neg Hx     DRUG ALLERGIES:  Allergies  Allergen Reactions  . Tramadol     Mouth feels like it's on fire    REVIEW OF SYSTEMS:   CONSTITUTIONAL: No fever, +fatigue Georgeann Oppenheim.  EYES: No blurred or double vision.  EARS, NOSE, AND THROAT: No tinnitus or ear pain.  RESPIRATORY: No cough, shortness of breath, wheezing or hemoptysis.  CARDIOVASCULAR: No chest pain, orthopnea, edema.  GASTROINTESTINAL: + nausea, vomiting, diarrhea, and abdominal pain.  GENITOURINARY: No dysuria, hematuria.  ENDOCRINE: No polyuria, nocturia,  HEMATOLOGY: No anemia, easy bruising or bleeding SKIN: No rash or lesion. MUSCULOSKELETAL: No joint pain or arthritis.   NEUROLOGIC: No tingling, numbness, weakness.  PSYCHIATRY: No anxiety or depression.   MEDICATIONS AT HOME:  Prior to Admission medications   Medication Sig Start Date End Date Taking? Authorizing Provider  ALPRAZolam Prudy Feeler) 0.5 MG tablet Take 0.5-1  tablets (0.25-0.5 mg total) by mouth every 6 (six) hours as needed for anxiety. Patient taking differently: Take 0.5-1 mg by mouth every 6 (six) hours as needed for anxiety.  01/08/17  Yes [name], MD  aspirin EC 81 MG tablet Take 1 tablet (81 mg total) by mouth daily. 02/19/15  Yes Jimmy Footman, MD  ciprofloxacin (CIPRO) 500 MG tablet Take 1 tablet (500 mg total) by mouth 2 (two) times daily for 10 days. 01/22/17 02/01/17 Yes Emily Filbert, MD  citalopram (CELEXA) 40 MG tablet Take 1 tablet (40 mg total) by mouth daily. 01/13/17  Yes [name], MD  clopidogrel (PLAVIX) 75 MG tablet Take 1 tablet (75 mg total) by mouth daily. 02/27/16  Yes Antonieta Iba, MD  diltiazem (CARDIZEM CD) 120 MG 24 hr capsule Take 1 capsule (120 mg total) by mouth daily. 01/31/16  Yes Antonieta Iba, MD  ezetimibe (ZETIA) 10 MG tablet Take 1 tablet (10 mg total) daily by mouth. 12/09/16  Yes [name], MD  gabapentin (NEURONTIN) 600 MG tablet Take one every morning and two every evening Patient taking differently: Take 600-1,200 mg by mouth 2 (two) times daily. Take 600 mg in the morning and 1200 mg in the evening. 12/18/16  Yes [name], MD  isosorbide mononitrate (IMDUR) 60 MG 24 hr tablet Take 1 tablet (60 mg total) by mouth daily. 12/19/15  Yes Gollan, Tollie Pizza, MD  levothyroxine (SYNTHROID, LEVOTHROID) 25 MCG tablet Take 1 tablet (25 mcg total) daily before breakfast by mouth. 12/18/16  Yes Fisher, Demetrios Isaacs, MD  losartan (COZAAR) 50 MG tablet Take 1 tablet (50 mg total) by mouth daily. Patient taking differently: Take 100 mg by mouth daily.  01/14/16  Yes [name], MD  metFORMIN (GLUCOPHAGE) 1000 MG tablet TAKE ONE TABLET BY MOUTH TWICE DAILY 03/19/16  Yes [name], MD  metoprolol succinate (TOPROL-XL) 25 MG 24 hr tablet Take 1 tablet (25 mg total) by mouth 2 (two) times daily. Take with or immediately following a meal. Patient taking differently:  Take 25 mg by mouth See admin instructions. Take with or immediately following a takes 1 tablet daily and another dose only if needed for tachycardia 09/25/16  Yes Gollan, Tollie Pizza, MD  metroNIDAZOLE (FLAGYL) 500 MG  tablet Take 1 tablet (500 mg total) by mouth 3 (three) times daily. 01/22/17  Yes Emily Filbert, MD  naproxen sodium (ANAPROX) 220 MG tablet Take 440 mg by mouth daily as needed (PAIN).   Yes [provider]  nitroGLYCERIN (NITROSTAT) 0.4 MG SL tablet Place 1 tablet (0.4 mg total) under the tongue every 5 (five) minutes as needed for chest pain. 04/30/15  Yes Gollan, Tollie Pizza, MD  oxyCODONE-acetaminophen (PERCOCET) 5-325 MG tablet Take 1-2 tablets by mouth every 6 (six) hours as needed. 01/22/17  Yes Emily Filbert, MD  pantoprazole (PROTONIX) 40 MG tablet Take 1 tablet (40 mg total) by mouth 2 (two) times daily. 05/11/16  Yes [name], MD  promethazine (PHENERGAN) 25 MG tablet Take 1 tablet (25 mg total) by mouth every 6 (six) hours as needed for nausea or vomiting. Reported on 05/31/2015 10/14/16  Yes [name], MD  rosuvastatin (CRESTOR) 40 MG tablet Take 1 tablet (40 mg total) daily at 6 PM by mouth. 12/09/16  Yes [name], MD  tiZANidine (ZANAFLEX) 4 MG tablet Take 4 mg by mouth 2 (two) times daily as needed (migraines).   Yes [provider]  traZODone (DESYREL) 100 MG tablet Take 2 tablets (200 mg total) by mouth at bedtime. 12/02/15  Yes [name], MD  zolpidem (AMBIEN) 5 MG tablet Take 1-2 tablets (5-10 mg total) by mouth at bedtime as needed for sleep. 11/28/16  Yes [name], MD  ondansetron (ZOFRAN ODT) 4 MG disintegrating tablet Take 1 tablet (4 mg total) by mouth every 8 (eight) hours as needed for nausea or vomiting. Patient not taking: Reported on 01/27/2017 01/22/17   Emily Filbert, MD      PHYSICAL EXAMINATION:   VITAL SIGNS: Blood pressure (!) 181/114, pulse (!) 126, temperature 98.3 F (36.8 C),  temperature source Oral, resp. rate 18, height 5\' 2"  (1.575 m), weight 73.9 kg (163 lb), SpO2 98 %.  GENERAL:  46 y.o.-year-old patient lying in the bed with no acute distress.  Nontoxic-appearing EYES: Pupils equal, round, reactive to light and accommodation. No scleral icterus. Extraocular muscles intact.  HEENT: Head atraumatic, normocephalic. Oropharynx and nasopharynx clear.  NECK:  Supple, no jugular venous distention. No thyroid enlargement, no tenderness.  LUNGS: Normal breath sounds bilaterally, no wheezing, rales,rhonchi or crepitation. No use of accessory muscles of respiration.  CARDIOVASCULAR: S1, S2 normal. No murmurs, rubs, or gallops.  ABDOMEN: Soft, nontender, nondistended. Bowel sounds present. No organomegaly or mass.  EXTREMITIES: No pedal edema, cyanosis, or clubbing.  NEUROLOGIC: Cranial nerves II through XII are intact. MAES. Gait not checked.  PSYCHIATRIC: The patient is alert and oriented x 3.  SKIN: No obvious rash, lesion, or ulcer.   LABORATORY PANEL:   CBC Recent Labs  Lab 01/22/17 1227 01/27/17 1402  WBC 8.3 12.7*  HGB 13.3 14.0  HCT 39.4 42.2  PLT 221 247  MCV 83.3 83.7  MCH 28.2 27.8  MCHC 33.9 33.2  RDW 14.0 13.8  LYMPHSABS  --  2.9  MONOABS  --  0.6  EOSABS  --  0.4  BASOSABS  --  0.1   ------------------------------------------------------------------------------------------------------------------  Chemistries  Recent Labs  Lab 01/22/17 1227 01/27/17 1402  NA 137 138  K 4.2 3.7  CL 103 109  CO2 25 21*  GLUCOSE 120* 124*  BUN 14 25*  CREATININE 1.70* 2.07*  CALCIUM 9.5 9.2  AST 32 21  ALT 41 20  ALKPHOS 171* 138*  BILITOT 0.6 0.8   ------------------------------------------------------------------------------------------------------------------ estimated creatinine clearance is 31.3 mL/min (A) (by C-G formula based on SCr of 2.07 mg/dL  (H)). ------------------------------------------------------------------------------------------------------------------ No results for input(s): TSH, T4TOTAL, T3FREE, THYROIDAB in the last 72 hours.  Invalid input(s): FREET3   Coagulation profile Recent Labs  Lab 01/27/17 1402  INR 1.22   ------------------------------------------------------------------------------------------------------------------- No results for input(s): DDIMER in the last 72 hours. -------------------------------------------------------------------------------------------------------------------  Cardiac Enzymes No results for input(s): CKMB, TROPONINI, MYOGLOBIN in the last 168 hours.  Invalid input(s): CK ------------------------------------------------------------------------------------------------------------------ Invalid input(s): POCBNP  ---------------------------------------------------------------------------------------------------------------  Urinalysis    Component Value Date/Time   COLORURINE YELLOW (A) 01/27/2017 1402   APPEARANCEUR CLOUDY (A) 01/27/2017 1402   APPEARANCEUR Cloudy (A) 07/17/2014 1455   LABSPEC 1.024 01/27/2017 1402   LABSPEC 1.012 12/29/2013 1210   PHURINE 5.0 01/27/2017 1402   GLUCOSEU NEGATIVE 01/27/2017 1402   GLUCOSEU >=500 12/29/2013 1210   HGBUR NEGATIVE 01/27/2017 1402   HGBUR negative 04/14/2010 1435   BILIRUBINUR NEGATIVE 01/27/2017 1402   BILIRUBINUR negative 10/14/2016 1550   BILIRUBINUR Negative 07/17/2014 1455   BILIRUBINUR Negative 12/29/2013 1210   KETONESUR NEGATIVE 01/27/2017 1402   PROTEINUR 100 (A) 01/27/2017 1402   UROBILINOGEN 0.2 10/14/2016 1550   UROBILINOGEN negative 04/14/2010 1435   NITRITE NEGATIVE 01/27/2017 1402   LEUKOCYTESUR TRACE (A) 01/27/2017 1402   LEUKOCYTESUR Trace (A) 07/17/2014 1455   LEUKOCYTESUR Negative 12/29/2013 1210     RADIOLOGY: Ct Abdomen Pelvis Wo Contrast  Result Date: 01/27/2017 CLINICAL DATA:  Acute  lower abdominal and back pain. EXAM: CT ABDOMEN AND PELVIS WITHOUT CONTRAST TECHNIQUE: Multidetector CT imaging of the abdomen and pelvis was performed following the standard protocol without IV contrast. COMPARISON:  CT scan of January 22, 2017. FINDINGS: Lower chest: No acute abnormality. Hepatobiliary: No focal liver abnormality is seen. Status post cholecystectomy. No biliary dilatation. Pancreas: Unremarkable. No pancreatic ductal dilatation or surrounding inflammatory changes. Spleen: Normal in size without focal abnormality. Adrenals/Urinary Tract: Adrenal glands appear normal. Nonobstructive left renal calculus is noted. No hydronephrosis or renal obstruction is noted. No ureteral calculi are noted. Urinary bladder is unremarkable. Stomach/Bowel: Status post gastric surgery. There is no evidence of bowel obstruction or inflammation. Status post appendectomy. Vascular/Lymphatic: No significant vascular findings are present. No enlarged abdominal or pelvic lymph nodes. Reproductive: Uterus and bilateral adnexa are unremarkable. Other: No abdominal wall hernia or abnormality. No abdominopelvic ascites. Musculoskeletal: No acute or significant osseous findings. IMPRESSION: Nonobstructive left renal calculus. No hydronephrosis or renal obstruction is noted. Status post gastric surgery, cholecystectomy and appendectomy. No acute abnormality seen in the abdomen or pelvis. Electronically Signed   By: Lupita Raider, M.D.   On: 01/27/2017 15:57   Dg Chest 2 View  Result Date: 01/27/2017 CLINICAL DATA:  Lower abdominal pain radiating into the back with nausea, vomiting and diarrhea for a week. EXAM: CHEST  2 VIEW COMPARISON:  PA and lateral chest 12/13/2015.  CT chest 05/31/2015. FINDINGS: The lungs are clear. Heart size is normal. Right aortic arch is noted. No pneumothorax or pleural effusion. No acute bony abnormality. IMPRESSION: No acute disease. Electronically Signed   By: Drusilla Kanner M.D.   On:  01/27/2017 14:47    EKG: Orders placed or performed in visit on 09/25/16  . EKG 12-Lead    IMPRESSION AND PLAN: 1 acute left-sided colitis Failed outpatient management, noted colonoscopy from 2017 which was normal per patient Admit to regular nursing floor bed, start IV Cipro/p.o. Flagyl 5-7-day course, clear liquids for now, strict  INO monitoring, serial abdominal examinations, and continue close medical monitoring  2 chronic diabetes mellitus type 2 Stable Hold metformin, continue other agents, sliding scale insulin with Accu-Cheks per routine  3 chronic peptic ulcer disease Stable PPI daily  4 chronic bipolar illness Stable Continue home psychotropic regiment  5 chronic kidney disease  Stable Avoid nephrotoxic agents and renally dose medications  Full code Condition stable Prognosis fair DVT prophylaxis with heparin subcu Disposition home in the next 1-3 days barring any complications   All the records are reviewed and case discussed with ED provider. Management plans discussed with the patient, family and they are in agreement.  CODE STATUS: Code Status History    Date Active Date Inactive Code Status Order ID Comments User Context   01/07/2016 16:30 01/07/2016 20:16 Full Code [id number]  Christena Flake, MD Inpatient   12/14/2015 02:39 12/14/2015 17:33 Full Code [id number]  Arnaldo Natal, MD ED   11/07/2015 15:21 11/08/2015 19:20 Full Code [id number]  Gracelyn Nurse, MD Inpatient   10/28/2015 16:04 11/02/2015 15:04 Full Code [id number]  Houston Siren, MD Inpatient   06/03/2015 15:10 06/05/2015 15:01 Full Code [id number]  Enedina Finner, MD Inpatient   04/17/2015 21:59 04/18/2015 20:45 Full Code [id number]  Altamese Dilling, MD ED   02/15/2015 20:04 02/20/2015 19:03 Full Code [id number]  Audery Amel, MD Inpatient   11/12/2014 08:33 11/13/2014 14:49 Full Code [id number]  Marcina Millard, MD Inpatient   11/07/2014 21:53 11/12/2014 08:33 Full Code [id number]  Houston Siren, MD Inpatient       TOTAL TIME TAKING CARE OF THIS PATIENT: 45 minutes.    Evelena Asa Ashaki Frosch M.D on 01/27/2017   Between 7am to 6pm - Pager - [phone]  After 6pm go to [email or web address] - password [email or web address]  [facility]  Office  [phone]  CC: Primary care physician; [name], MD   Note: This dictation was prepared with Dragon dictation along with smaller phrase technology. Any transcriptional errors that result from this process are unintentional.

## 2017-01-27 NOTE — ED Notes (Signed)
Patient to room 11, Lorrie RN aware of placement in room.

## 2017-01-28 LAB — BASIC METABOLIC PANEL
ANION GAP: 8 (ref 5–15)
BUN: 21 mg/dL — ABNORMAL HIGH (ref 6–20)
CALCIUM: 8.2 mg/dL — AB (ref 8.9–10.3)
CHLORIDE: 109 mmol/L (ref 101–111)
CO2: 23 mmol/L (ref 22–32)
Creatinine, Ser: 1.81 mg/dL — ABNORMAL HIGH (ref 0.44–1.00)
GFR calc Af Amer: 37 mL/min — ABNORMAL LOW (ref >60)
GFR calc non Af Amer: 32 mL/min — ABNORMAL LOW (ref >60)
GLUCOSE: 101 mg/dL — AB (ref 65–99)
Potassium: 3.4 mmol/L — ABNORMAL LOW (ref 3.5–5.1)
Sodium: 140 mmol/L (ref 135–145)

## 2017-01-28 LAB — CBC
HEMATOCRIT: 32.2 % — AB (ref 35.0–47.0)
HEMOGLOBIN: 10.8 g/dL — AB (ref 12.0–16.0)
MCH: 28.1 pg (ref 26.0–34.0)
MCHC: 33.6 g/dL (ref 32.0–36.0)
MCV: 83.6 fL (ref 80.0–100.0)
Platelets: 164 10*3/uL (ref 150–440)
RBC: 3.85 MIL/uL (ref 3.80–5.20)
RDW: 13.6 % (ref 11.5–14.5)
WBC: 7.3 10*3/uL (ref 3.6–11.0)

## 2017-01-28 LAB — GLUCOSE, CAPILLARY
GLUCOSE-CAPILLARY: 88 mg/dL (ref 65–99)
GLUCOSE-CAPILLARY: 140 mg/dL — AB (ref 65–99)
Glucose-Capillary: 143 mg/dL — ABNORMAL HIGH (ref 65–99)
Glucose-Capillary: 129 mg/dL — ABNORMAL HIGH (ref 65–99)

## 2017-01-28 MED ORDER — GABAPENTIN 400 MG PO CAPS
1200.0000 mg | ORAL_CAPSULE | Freq: Every day | ORAL | Status: DC
  Administered 2017-01-28: 1200 mg via ORAL
  Filled 2017-01-28: qty 3

## 2017-01-28 MED ORDER — RISAQUAD PO CAPS
1.0000 | ORAL_CAPSULE | Freq: Three times a day (TID) | ORAL | Status: DC
  Administered 2017-01-28 – 2017-01-29 (×5): 1 via ORAL
  Filled 2017-01-28 (×5): qty 1

## 2017-01-28 MED ORDER — GABAPENTIN 300 MG PO CAPS
600.0000 mg | ORAL_CAPSULE | Freq: Every day | ORAL | Status: DC
  Administered 2017-01-29: 600 mg via ORAL
  Filled 2017-01-28: qty 2

## 2017-01-28 NOTE — Progress Notes (Signed)
[name] at [place] NAME: [name]    MR#:  [id number]  DATE OF BIRTH:  1970/04/23  SUBJECTIVE here for abdominal pain, acute left-sided colitis, failed outpatient therapy.  Now on IV antibiotics, patient is feeling better, tolerating the diet.  Slept well.  Still complains of suprapubic pain and asking for pain medicine.  CHIEF COMPLAINT:   Chief Complaint  Patient presents with  . Abdominal Pain    REVIEW OF SYSTEMS:   ROS CONSTITUTIONAL: No fever, fatigue or weakness.  EYES: No blurred or double vision.  EARS, NOSE, AND THROAT: No tinnitus or ear pain.  RESPIRATORY: No cough, shortness of breath, wheezing or hemoptysis.  CARDIOVASCULAR: No chest pain, orthopnea, edema.  GASTROINTESTINAL: mild abdominal pain.   GENITOURINARY: No dysuria, hematuria.  ENDOCRINE: No polyuria, nocturia,  HEMATOLOGY: No anemia, easy bruising or bleeding SKIN: No rash or lesion. MUSCULOSKELETAL: No joint pain or arthritis.   NEUROLOGIC: No tingling, numbness, weakness.  PSYCHIATRY: No anxiety or depression.   DRUG ALLERGIES:   Allergies  Allergen Reactions  . Tramadol     Mouth feels like it's on fire    VITALS:  Blood pressure (!) 161/81, pulse 94, temperature 98.7 F (37.1 C), temperature source Oral, resp. rate 17, height 5\' 2"  (1.575 m), weight 73.9 kg (163 lb), SpO2 100 %.  PHYSICAL EXAMINATION:  GENERAL:  46 y.o.-year-old patient lying in the bed with no acute distress.  EYES: Pupils equal, round, reactive to light and accommodation. No scleral icterus. Extraocular muscles intact.  HEENT: Head atraumatic, normocephalic. Oropharynx and nasopharynx clear.  NECK:  Supple, no jugular venous distention. No thyroid enlargement, no tenderness.  LUNGS: Normal breath sounds bilaterally, no wheezing, rales,rhonchi or crepitation. No use of accessory muscles of respiration.  CARDIOVASCULAR: S1, S2 normal. No murmurs, rubs, or gallops.   ABDOMEN: Soft, nontender, nondistended. Bowel sounds present. No organomegaly or mass.  EXTREMITIES: No pedal edema, cyanosis, or clubbing.  NEUROLOGIC: Cranial nerves II through XII are intact. Muscle strength 5/5 in all extremities. Sensation intact. Gait not checked.  PSYCHIATRIC: The patient is alert and oriented x 3.  SKIN: No obvious rash, lesion, or ulcer.    LABORATORY PANEL:   CBC Recent Labs  Lab 01/28/17 0330  WBC 7.3  HGB 10.8*  HCT 32.2*  PLT 164   ------------------------------------------------------------------------------------------------------------------  Chemistries  Recent Labs  Lab 01/27/17 1402 01/28/17 0330  NA 138 140  K 3.7 3.4*  CL 109 109  CO2 21* 23  GLUCOSE 124* 101*  BUN 25* 21*  CREATININE 2.07* 1.81*  CALCIUM 9.2 8.2*  AST 21  --   ALT 20  --   ALKPHOS 138*  --   BILITOT 0.8  --    ------------------------------------------------------------------------------------------------------------------  Cardiac Enzymes No results for input(s): TROPONINI in the last 168 hours. ------------------------------------------------------------------------------------------------------------------  RADIOLOGY:  Ct Abdomen Pelvis Wo Contrast  Result Date: 01/27/2017 CLINICAL DATA:  Acute lower abdominal and back pain. EXAM: CT ABDOMEN AND PELVIS WITHOUT CONTRAST TECHNIQUE: Multidetector CT imaging of the abdomen and pelvis was performed following the standard protocol without IV contrast. COMPARISON:  CT scan of January 22, 2017. FINDINGS: Lower chest: No acute abnormality. Hepatobiliary: No focal liver abnormality is seen. Status post cholecystectomy. No biliary dilatation. Pancreas: Unremarkable. No pancreatic ductal dilatation or surrounding inflammatory changes. Spleen: Normal in size without focal abnormality. Adrenals/Urinary Tract: Adrenal glands appear normal. Nonobstructive left renal calculus is noted. No hydronephrosis or renal obstruction  is  noted. No ureteral calculi are noted. Urinary bladder is unremarkable. Stomach/Bowel: Status post gastric surgery. There is no evidence of bowel obstruction or inflammation. Status post appendectomy. Vascular/Lymphatic: No significant vascular findings are present. No enlarged abdominal or pelvic lymph nodes. Reproductive: Uterus and bilateral adnexa are unremarkable. Other: No abdominal wall hernia or abnormality. No abdominopelvic ascites. Musculoskeletal: No acute or significant osseous findings. IMPRESSION: Nonobstructive left renal calculus. No hydronephrosis or renal obstruction is noted. Status post gastric surgery, cholecystectomy and appendectomy. No acute abnormality seen in the abdomen or pelvis. Electronically Signed   By: [name], M.D.   On: 01/27/2017 15:57   Dg Chest 2 View  Result Date: 01/27/2017 CLINICAL DATA:  Lower abdominal pain radiating into the back with nausea, vomiting and diarrhea for a week. EXAM: CHEST  2 VIEW COMPARISON:  PA and lateral chest 12/13/2015.  CT chest 05/31/2015. FINDINGS: The lungs are clear. Heart size is normal. Right aortic arch is noted. No pneumothorax or pleural effusion. No acute bony abnormality. IMPRESSION: No acute disease. Electronically Signed   By: [name] M.D.   On: 01/27/2017 14:47    EKG:   Orders placed or performed in visit on 09/25/16  . EKG 12-Lead    ASSESSMENT AND PLAN:   #1 acute left-sided colitis, failed outpatient therapy, continue IV Cipro, Zosyn, tolerating the diet, will likely discharge home tomorrow with oral antibiotics.  Decrease IV fluids today. 2 diabetes mellitus type 2: Continue insulin with sliding scale coverage, hold metformin. 3. Acute on  Chronic kidney disease stage III:   On  IV fluids,, hold metformin.  Creatinine baseline is around 1.5.  Avoid Lasix, and NSAIDS> 4.  Bipolar disorder:, Anxiety: Continue Celexa, Lunesta, trazodone. #5 history of CAD status post CABG in 2015: Continue  aspirin, statins, beta-blockers, nitrates 6.  History of ischemic stroke: Continue aspirin, Plavix. 7.  History of migraines, history of multiple sclerosis as per neurology note. #8 essential hypertension, results of tachycardia: Continue metoprolol, Cardizem, patient follows with Dr. [name].  5.  All the records are reviewed and case discussed with Care Management/Social Workerr. Management plans discussed with the patient, family and they are in agreement.  CODE STATUS: full  TOTAL TIME TAKING CARE OF THIS PATIENT;54minutes.   POSSIBLE D/C IN 1-2 DAYS, DEPENDING ON CLINICAL CONDITION.   Epifanio Lesches M.D on 01/28/2017 at 10:26 AM  Between 7am to 6pm - Pager - [phone]  After 6pm go to [email or web address] - password [email or web address] [facility]  Office  [phone]  CC: Primary care physician; [name], MD   Note: This dictation was prepared with Dragon dictation along with smaller phrase technology. Any transcriptional errors that result from this process are unintentional.

## 2017-01-29 LAB — GLUCOSE, CAPILLARY
GLUCOSE-CAPILLARY: 91 mg/dL (ref 65–99)
Glucose-Capillary: 197 mg/dL — ABNORMAL HIGH (ref 65–99)

## 2017-01-29 LAB — CBC
HCT: 31.4 % — ABNORMAL LOW (ref 35.0–47.0)
HEMOGLOBIN: 10.6 g/dL — AB (ref 12.0–16.0)
MCH: 28.4 pg (ref 26.0–34.0)
MCHC: 33.6 g/dL (ref 32.0–36.0)
MCV: 84.4 fL (ref 80.0–100.0)
PLATELETS: 137 10*3/uL — AB (ref 150–440)
RBC: 3.72 MIL/uL — AB (ref 3.80–5.20)
RDW: 13.5 % (ref 11.5–14.5)
WBC: 6.4 10*3/uL (ref 3.6–11.0)

## 2017-01-29 LAB — HIV ANTIBODY (ROUTINE TESTING W REFLEX): HIV Screen 4th Generation wRfx: NONREACTIVE

## 2017-01-29 IMAGING — CR DG CHEST 2V
1 series · 2 of 2 positions shown · non-contrast
Comparison: None.

CLINICAL DATA: Preop clearance.

EXAM:
CHEST  2 VIEW

[Series 1: dg chest 2 view · 0.14mm/px · 2 of 2 slices shown]
[im 1/2]
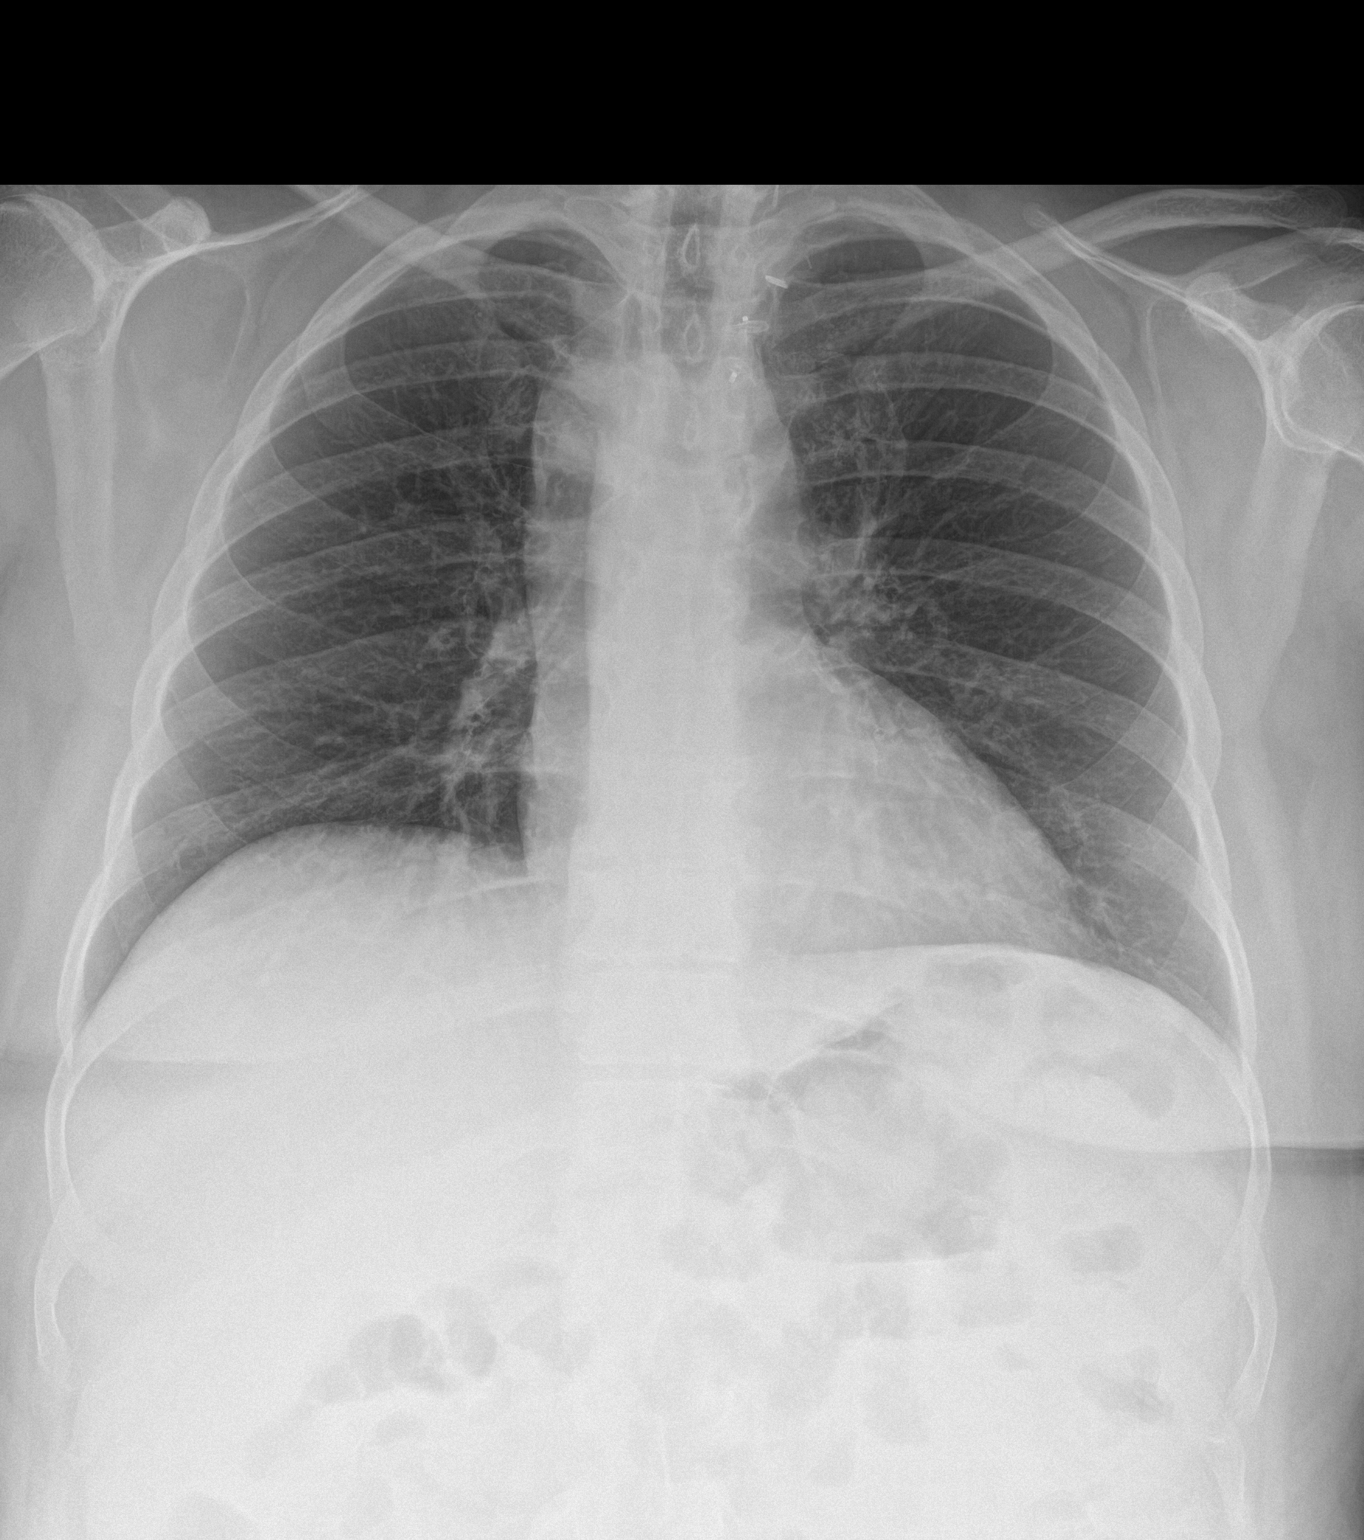
[im 2/2]
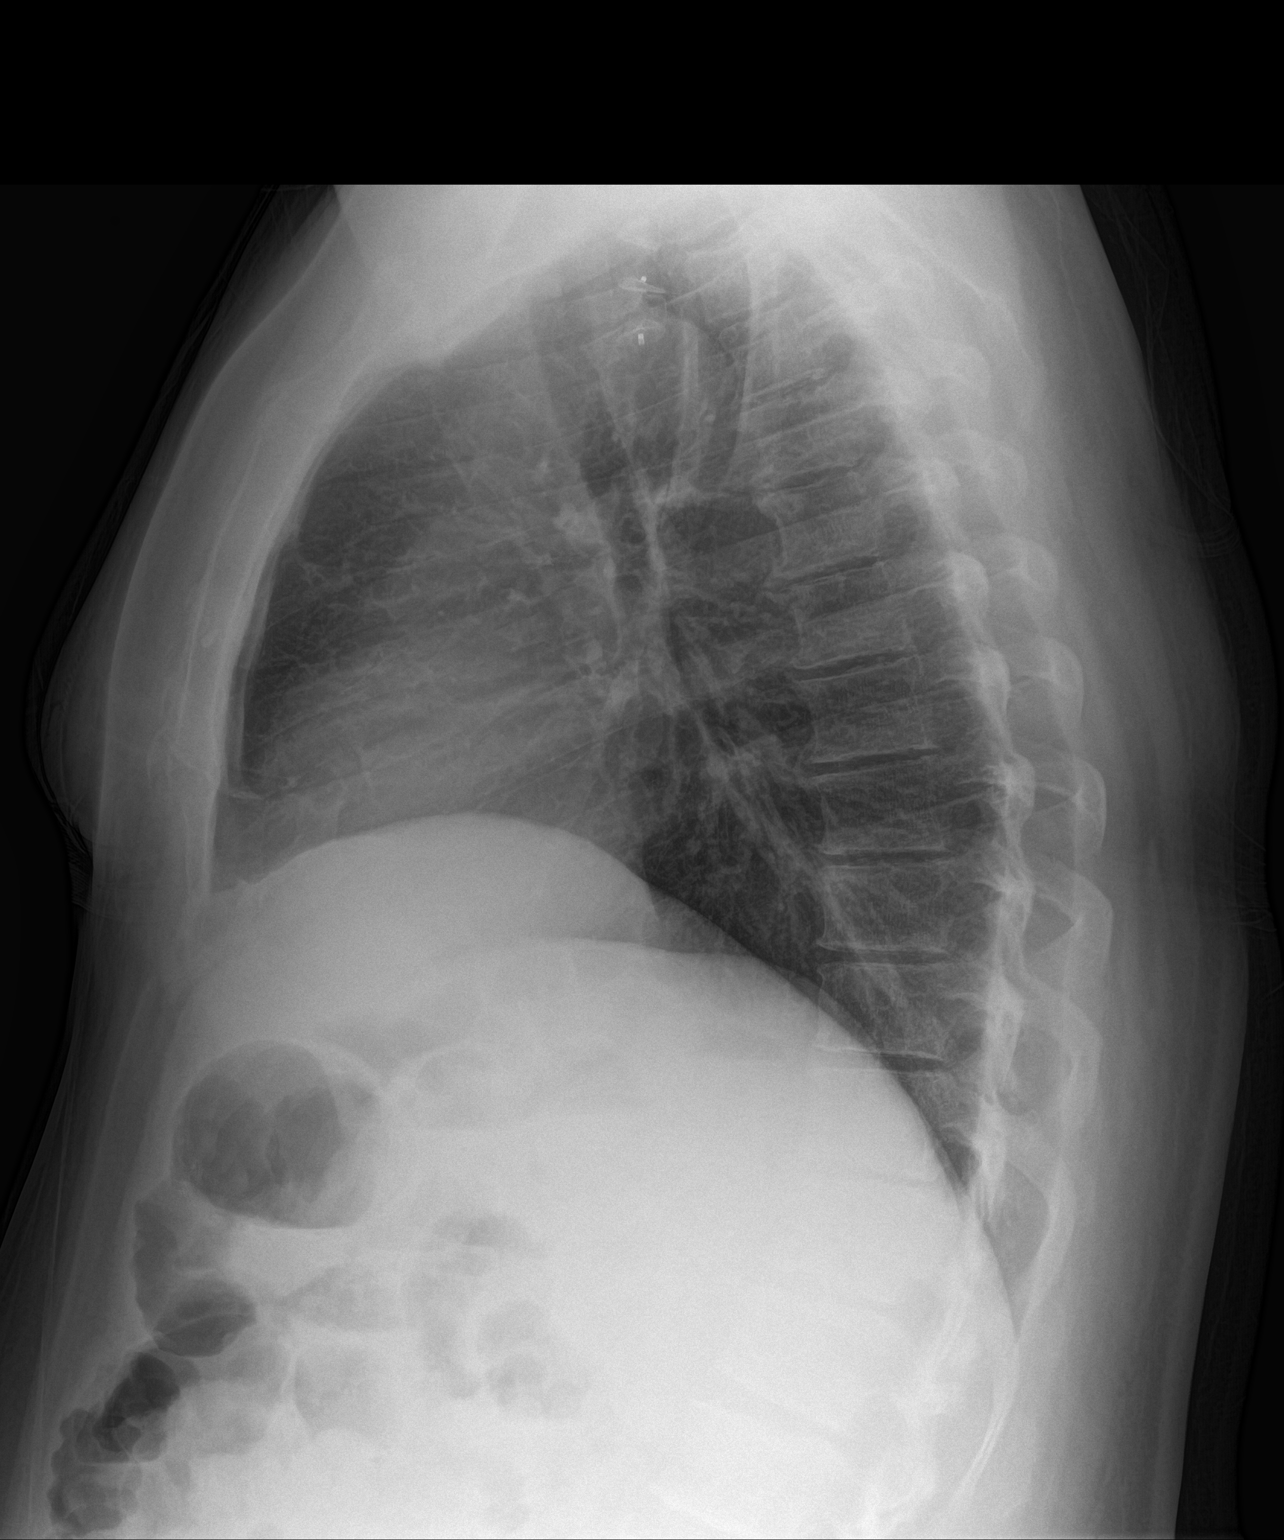

[2 of 2 positions shown; findings below may reference images not displayed]

FINDINGS: Normal mediastinum and cardiac silhouette. Normal pulmonary
vasculature. No evidence of effusion, infiltrate, or pneumothorax.
No acute bony abnormality. Surgical clips along the LEFT thyroid
bed.
IMPRESSION: No acute cardiopulmonary process.

## 2017-01-29 IMAGING — CT CT HEAD W/O CM
1 series · 16 of 30 positions shown, 20 images · non-contrast
Comparison: 06/03/2014

CLINICAL DATA: Depression. Previous stroke. Preprocedure evaluation
for electroconvulsive therapy.

EXAM:
CT HEAD WITHOUT CONTRAST
TECHNIQUE: Contiguous axial images were obtained from the base of the skull
through the vertex without intravenous contrast.

[Series 2: head wo · axial · 0.39mm/px · z∈[+1171,+1302]mm · 16 of 30 slices shown, 20 images]
[im 2/30  brain]
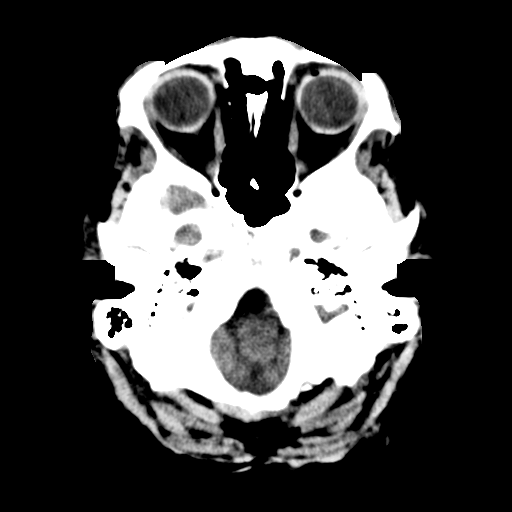
[im 2/30  bone]
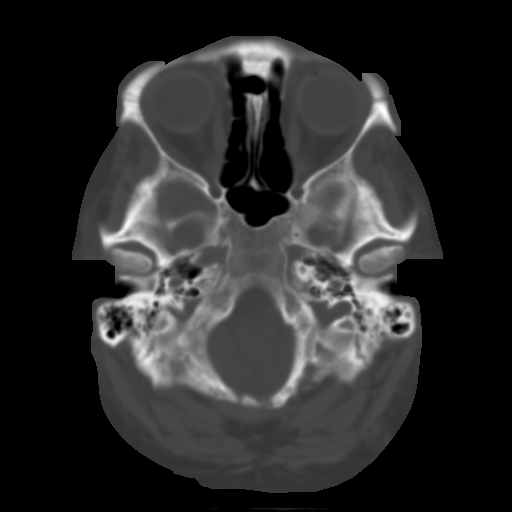
[im 4/30  brain]
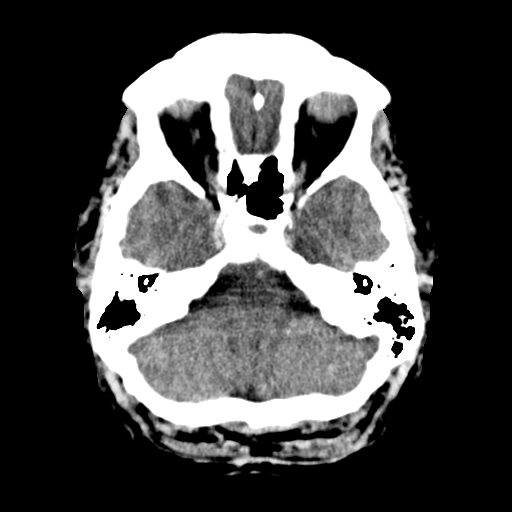
[im 6/30  brain]
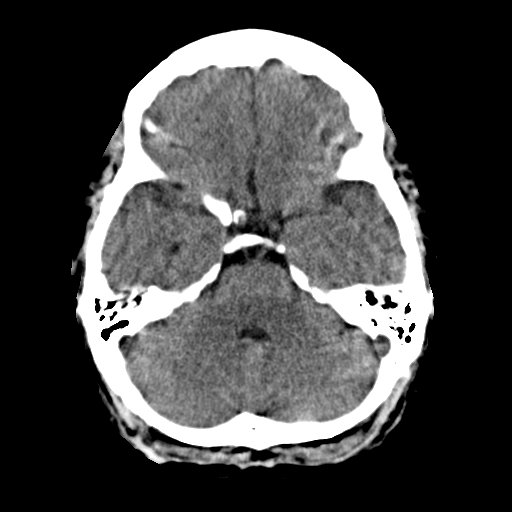
[im 8/30  brain]
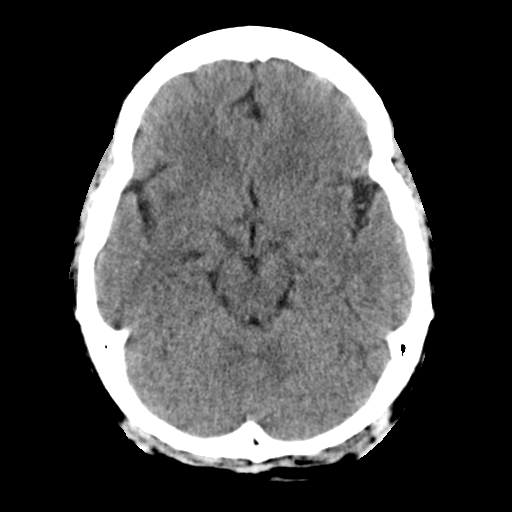
[im 9/30  brain]
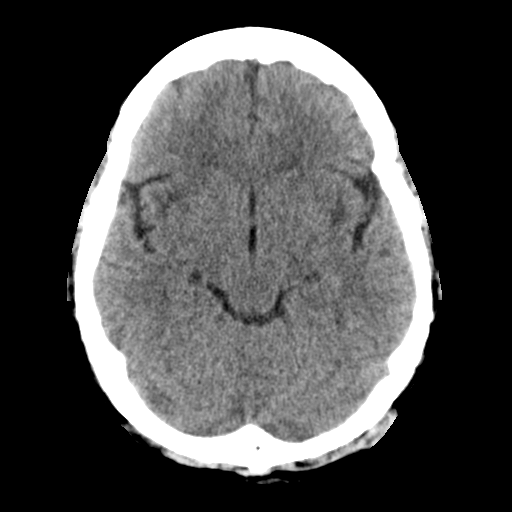
[im 9/30  bone]
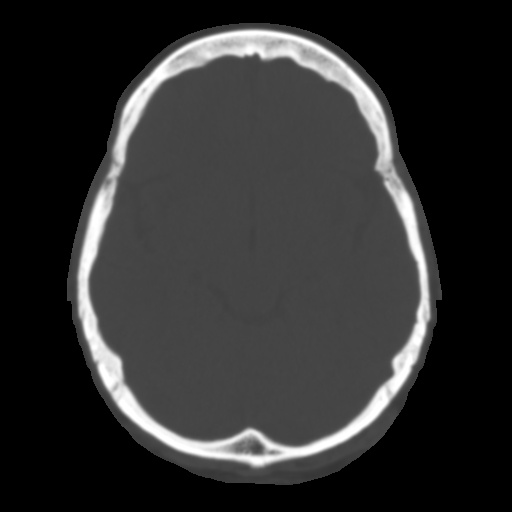
[im 11/30  brain]
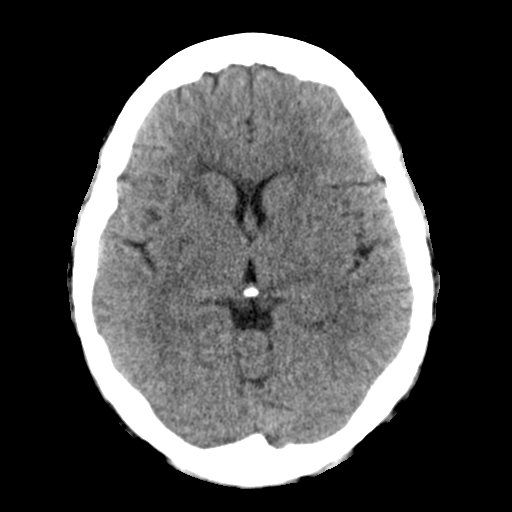
[im 13/30  brain]
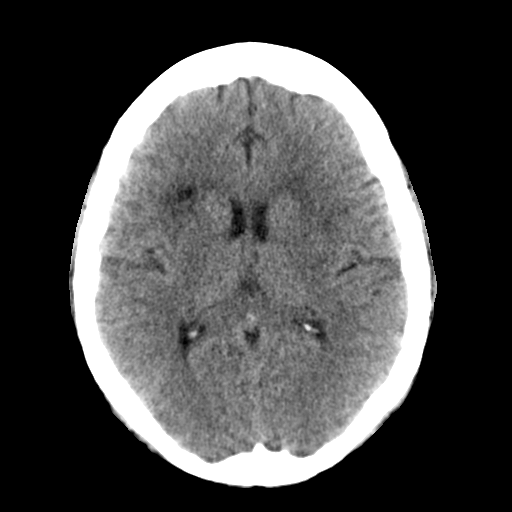
[im 15/30  brain]
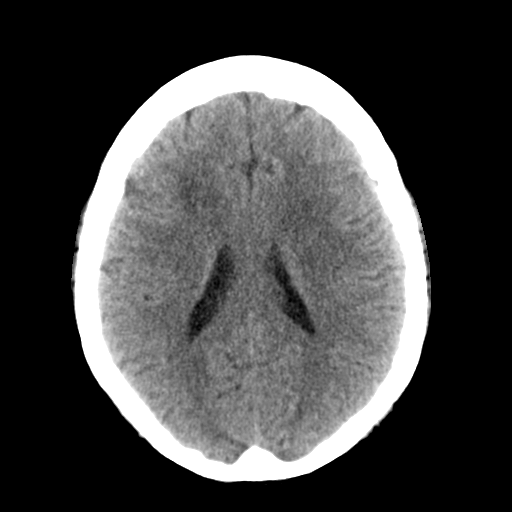
[im 16/30  brain]
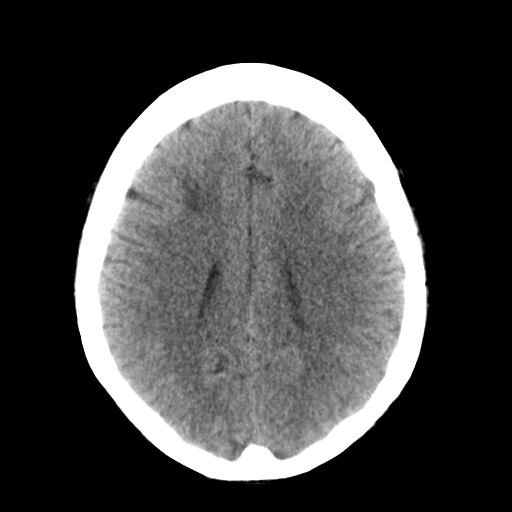
[im 16/30  bone]
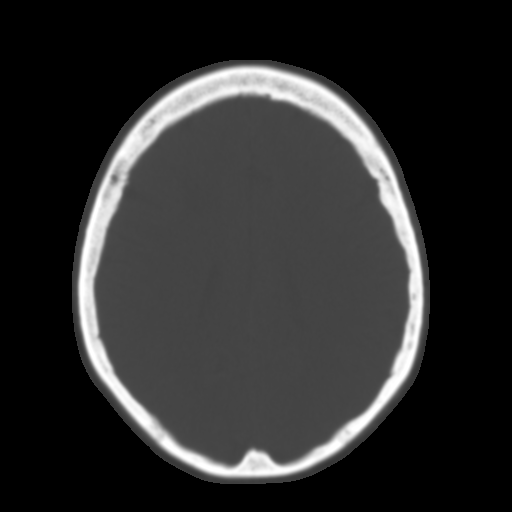
[im 18/30  brain]
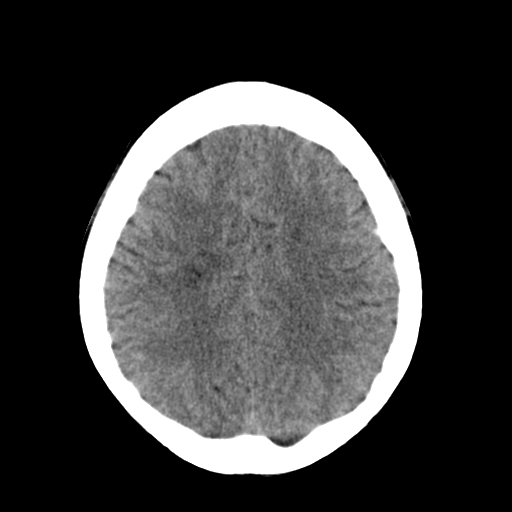
[im 20/30  brain]
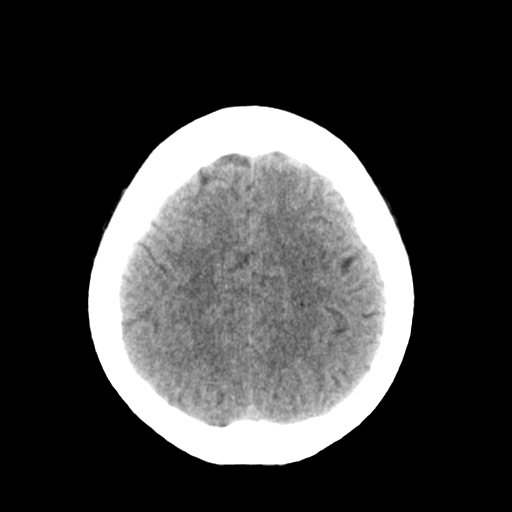
[im 22/30  brain]
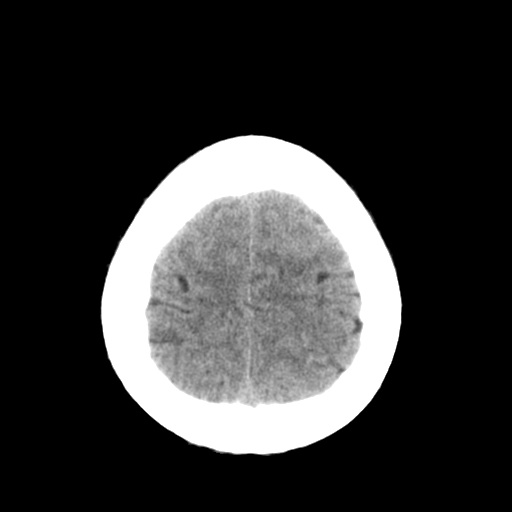
[im 23/30  brain]
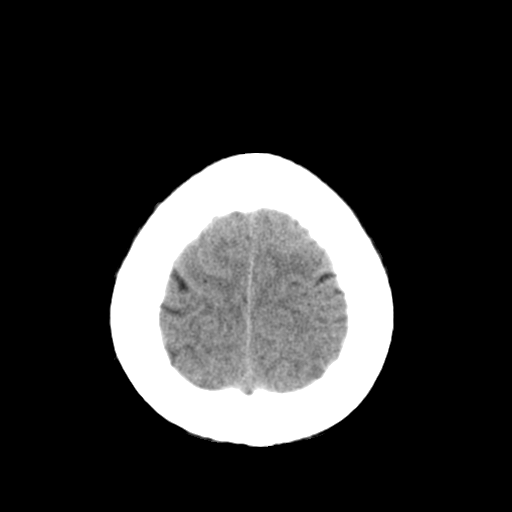
[im 23/30  bone]
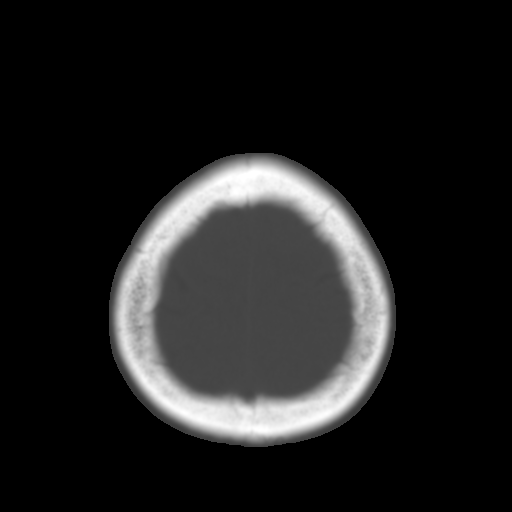
[im 25/30  brain]
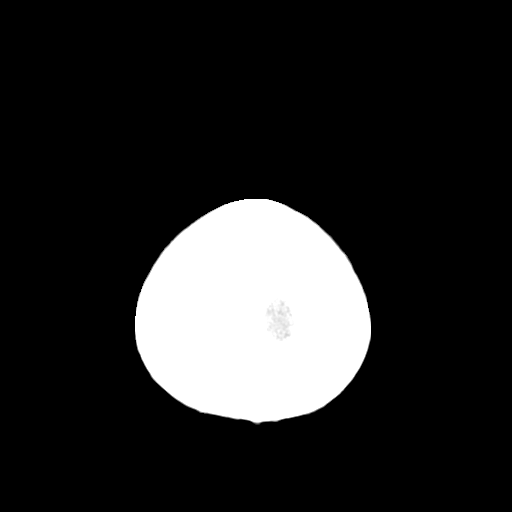
[im 27/30  brain]
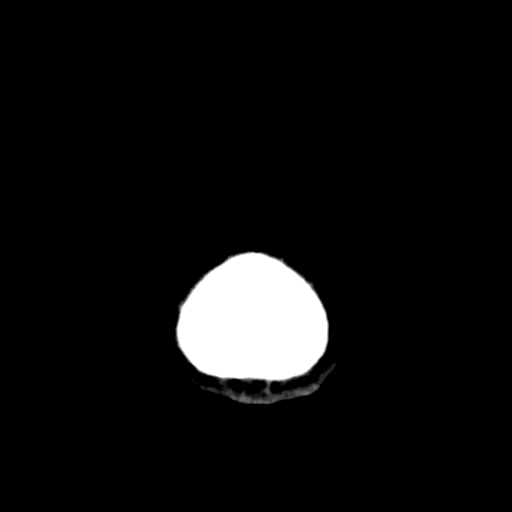
[im 29/30  brain]
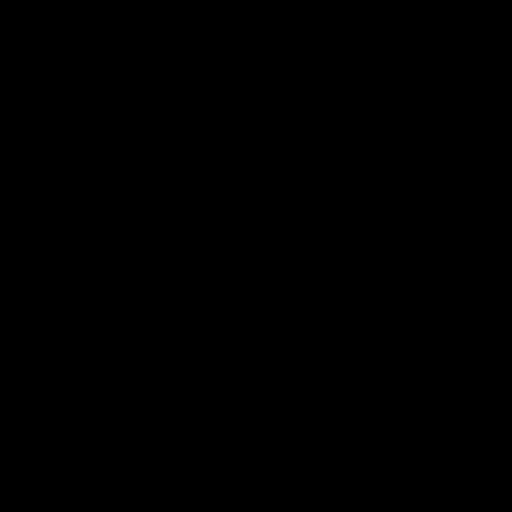

[16 of 30 positions shown; findings below may reference images not displayed]

FINDINGS: There is no evidence of intracranial hemorrhage, brain edema, or
other signs of acute infarction. There is no evidence of
intracranial mass lesion or mass effect. No abnormal extraaxial
fluid collections are identified.

Chronic small vessel disease is again demonstrated as well as old
left thalamic lacunar infarct. No evidence hydrocephalus. No skull
abnormality identified.
IMPRESSION: No acute intracranial abnormality. Chronic small vessel disease and
old left thalamic lacunar infarct.

## 2017-01-29 MED ORDER — METOPROLOL SUCCINATE ER 25 MG PO TB24
12.5000 mg | ORAL_TABLET | Freq: Every day | ORAL | 0 refills | Status: DC
Start: 2017-01-29 — End: 2017-03-16

## 2017-01-29 MED ORDER — METOPROLOL SUCCINATE ER 25 MG PO TB24
12.5000 mg | ORAL_TABLET | Freq: Every day | ORAL | Status: DC
  Administered 2017-01-29: 12.5 mg via ORAL
  Filled 2017-01-29: qty 1

## 2017-01-29 MED ORDER — LOSARTAN POTASSIUM 25 MG PO TABS: 25.0000 mg | ORAL_TABLET | Freq: Every day | ORAL | 0 refills | Status: DC

## 2017-01-29 MED ORDER — DOCUSATE SODIUM 100 MG PO CAPS
100.0000 mg | ORAL_CAPSULE | Freq: Every day | ORAL | Status: DC
  Administered 2017-01-29: 100 mg via ORAL
  Filled 2017-01-29: qty 1

## 2017-01-29 MED ORDER — CIPROFLOXACIN HCL 500 MG PO TABS
500.0000 mg | ORAL_TABLET | Freq: Two times a day (BID) | ORAL | 0 refills | Status: AC
Start: 2017-01-29 — End: 2017-02-08

## 2017-01-29 MED ORDER — LOSARTAN POTASSIUM 25 MG PO TABS
25.0000 mg | ORAL_TABLET | Freq: Every day | ORAL | Status: DC
  Administered 2017-01-29: 25 mg via ORAL
  Filled 2017-01-29: qty 1

## 2017-01-29 MED ORDER — METRONIDAZOLE 500 MG PO TABS: 500.0000 mg | ORAL_TABLET | Freq: Three times a day (TID) | ORAL | 0 refills | Status: DC

## 2017-01-29 NOTE — Progress Notes (Signed)
Per MD okay for RN to put an order for colace.

## 2017-01-29 NOTE — Progress Notes (Signed)
[place]  A and O x 4. VSS. Pt tolerating diet well. No complaints of pain or nausea. IV removed intact, prescriptions given. Pt voiced understanding of discharge instructions with no further questions. Pt discharged via wheelchair with nurse tech.     Allergies as of 01/29/2017      Reactions   Tramadol    Mouth feels like it's on fire      Medication List    STOP taking these medications   naproxen sodium 220 MG tablet Commonly known as:  ALEVE     TAKE these medications   ALPRAZolam 0.5 MG tablet Commonly known as:  XANAX Take 0.5-1 tablets (0.25-0.5 mg total) by mouth every 6 (six) hours as needed for anxiety. What changed:  how much to take   aspirin EC 81 MG tablet Take 1 tablet (81 mg total) by mouth daily.   ciprofloxacin 500 MG tablet Commonly known as:  CIPRO Take 1 tablet (500 mg total) by mouth 2 (two) times daily for 10 days.   citalopram 40 MG tablet Commonly known as:  CELEXA Take 1 tablet (40 mg total) by mouth daily.   clopidogrel 75 MG tablet Commonly known as:  PLAVIX Take 1 tablet (75 mg total) by mouth daily.   diltiazem 120 MG 24 hr capsule Commonly known as:  CARDIZEM CD Take 1 capsule (120 mg total) by mouth daily.   ezetimibe 10 MG tablet Commonly known as:  ZETIA Take 1 tablet (10 mg total) daily by mouth.   gabapentin 600 MG tablet Commonly known as:  NEURONTIN Take one every morning and two every evening What changed:    how much to take  how to take this  when to take this  additional instructions   isosorbide mononitrate 60 MG 24 hr tablet Commonly known as:  IMDUR Take 1 tablet (60 mg total) by mouth daily.   levothyroxine 25 MCG tablet Commonly known as:  SYNTHROID, LEVOTHROID Take 1 tablet (25 mcg total) daily before breakfast by mouth.   losartan 25 MG tablet Commonly known as:  COZAAR Take 1 tablet (25 mg total) by mouth daily. What changed:    medication strength  how much to take   metFORMIN 1000  MG tablet Commonly known as:  GLUCOPHAGE TAKE ONE TABLET BY MOUTH TWICE DAILY   metoprolol succinate 25 MG 24 hr tablet Commonly known as:  TOPROL-XL Take 0.5 tablets (12.5 mg total) by mouth daily. What changed:    how much to take  when to take this  additional instructions   metroNIDAZOLE 500 MG tablet Commonly known as:  FLAGYL Take 1 tablet (500 mg total) by mouth 3 (three) times daily.   nitroGLYCERIN 0.4 MG SL tablet Commonly known as:  NITROSTAT Place 1 tablet (0.4 mg total) under the tongue every 5 (five) minutes as needed for chest pain.   ondansetron 4 MG disintegrating tablet Commonly known as:  ZOFRAN ODT Take 1 tablet (4 mg total) by mouth every 8 (eight) hours as needed for nausea or vomiting.   oxyCODONE-acetaminophen 5-325 MG tablet Commonly known as:  PERCOCET Take 1-2 tablets by mouth every 6 (six) hours as needed.   pantoprazole 40 MG tablet Commonly known as:  PROTONIX Take 1 tablet (40 mg total) by mouth 2 (two) times daily.   promethazine 25 MG tablet Commonly known as:  PHENERGAN Take 1 tablet (25 mg total) by mouth every 6 (six) hours as needed for nausea or vomiting. Reported on 05/31/2015  rosuvastatin 40 MG tablet Commonly known as:  CRESTOR Take 1 tablet (40 mg total) daily at 6 PM by mouth.   tiZANidine 4 MG tablet Commonly known as:  ZANAFLEX Take 4 mg by mouth 2 (two) times daily as needed (migraines).   traZODone 100 MG tablet Commonly known as:  DESYREL Take 2 tablets (200 mg total) by mouth at bedtime.   zolpidem 5 MG tablet Commonly known as:  AMBIEN Take 1-2 tablets (5-10 mg total) by mouth at bedtime as needed for sleep.       Vitals:   01/29/17 1042 01/29/17 1300  BP: 115/65 (!) 131/59  Pulse: 68 86  Resp:  14  Temp:  98.3 F (36.8 C)  SpO2:  100%    Francesco Sor

## 2017-01-29 NOTE — Progress Notes (Signed)
[facility] [place] NAME: [name]    MR#:  [id number]  DATE OF BIRTH:  March 18, 1970  SUBJECTIVE here for abdominal pain, acute left-sided colitis, failed outpatient therapy.  Now on IV antibiotics.  She states she has little nausea but overall better than when she came.  Had a BM this morning.  BP slightly low so I adjusted the BP medication.  Discussed this with patient, told the patient that she will probably go home later today.  And she agreed for that.    CHIEF COMPLAINT:   Chief Complaint  Patient presents with  . Abdominal Pain    REVIEW OF SYSTEMS:   ROS CONSTITUTIONAL: No fever, fatigue or weakness.  EYES: No blurred or double vision.  EARS, NOSE, AND THROAT: No tinnitus or ear pain.  RESPIRATORY: No cough, shortness of breath, wheezing or hemoptysis.  CARDIOVASCULAR: No chest pain, orthopnea, edema.  GASTROINTESTINAL: mild abdominal pain.   GENITOURINARY: No dysuria, hematuria.  ENDOCRINE: No polyuria, nocturia,  HEMATOLOGY: No anemia, easy bruising or bleeding SKIN: No rash or lesion. MUSCULOSKELETAL: No joint pain or arthritis.   NEUROLOGIC: No tingling, numbness, weakness.  PSYCHIATRY: No anxiety or depression.   DRUG ALLERGIES:   Allergies  Allergen Reactions  . Tramadol     Mouth feels like it's on fire    VITALS:  Blood pressure 117/80, pulse (!) 57, temperature 97.9 F (36.6 C), temperature source Oral, resp. rate 18, height 5\' 2"  (1.575 m), weight 73.9 kg (163 lb), SpO2 95 %.  PHYSICAL EXAMINATION:  GENERAL:  46 y.o.-year-old patient lying in the bed with no acute distress.  EYES: Pupils equal, round, reactive to light and accommodation. No scleral icterus. Extraocular muscles intact.  HEENT: Head atraumatic, normocephalic. Oropharynx and nasopharynx clear.  NECK:  Supple, no jugular venous distention. No thyroid enlargement, no tenderness.  LUNGS: Normal breath sounds bilaterally, no wheezing,  rales,rhonchi or crepitation. No use of accessory muscles of respiration.  CARDIOVASCULAR: S1, S2 normal. No murmurs, rubs, or gallops.  ABDOMEN: Soft, nontender, nondistended. Bowel sounds present. No organomegaly or mass.  EXTREMITIES: No pedal edema, cyanosis, or clubbing.  NEUROLOGIC: Cranial nerves II through XII are intact. Muscle strength 5/5 in all extremities. Sensation intact. Gait not checked.  PSYCHIATRIC: The patient is alert and oriented x 3.  SKIN: No obvious rash, lesion, or ulcer.    LABORATORY PANEL:   CBC Recent Labs  Lab 01/29/17 0343  WBC 6.4  HGB 10.6*  HCT 31.4*  PLT 137*   ------------------------------------------------------------------------------------------------------------------  Chemistries  Recent Labs  Lab 01/27/17 1402 01/28/17 0330  NA 138 140  K 3.7 3.4*  CL 109 109  CO2 21* 23  GLUCOSE 124* 101*  BUN 25* 21*  CREATININE 2.07* 1.81*  CALCIUM 9.2 8.2*  AST 21  --   ALT 20  --   ALKPHOS 138*  --   BILITOT 0.8  --    ------------------------------------------------------------------------------------------------------------------  Cardiac Enzymes No results for input(s): TROPONINI in the last 168 hours. ------------------------------------------------------------------------------------------------------------------  RADIOLOGY:  Ct Abdomen Pelvis Wo Contrast  Result Date: 01/27/2017 CLINICAL DATA:  Acute lower abdominal and back pain. EXAM: CT ABDOMEN AND PELVIS WITHOUT CONTRAST TECHNIQUE: Multidetector CT imaging of the abdomen and pelvis was performed following the standard protocol without IV contrast. COMPARISON:  CT scan of January 22, 2017. FINDINGS: Lower chest: No acute abnormality. Hepatobiliary: No focal liver abnormality is seen. Status post cholecystectomy. No biliary dilatation. Pancreas: Unremarkable. No  pancreatic ductal dilatation or surrounding inflammatory changes. Spleen: Normal in size without focal abnormality.  Adrenals/Urinary Tract: Adrenal glands appear normal. Nonobstructive left renal calculus is noted. No hydronephrosis or renal obstruction is noted. No ureteral calculi are noted. Urinary bladder is unremarkable. Stomach/Bowel: Status post gastric surgery. There is no evidence of bowel obstruction or inflammation. Status post appendectomy. Vascular/Lymphatic: No significant vascular findings are present. No enlarged abdominal or pelvic lymph nodes. Reproductive: Uterus and bilateral adnexa are unremarkable. Other: No abdominal wall hernia or abnormality. No abdominopelvic ascites. Musculoskeletal: No acute or significant osseous findings. IMPRESSION: Nonobstructive left renal calculus. No hydronephrosis or renal obstruction is noted. Status post gastric surgery, cholecystectomy and appendectomy. No acute abnormality seen in the abdomen or pelvis. Electronically Signed   By: Marijo Conception, M.D.   On: 01/27/2017 15:57   Dg Chest 2 View  Result Date: 01/27/2017 CLINICAL DATA:  Lower abdominal pain radiating into the back with nausea, vomiting and diarrhea for a week. EXAM: CHEST  2 VIEW COMPARISON:  PA and lateral chest 12/13/2015.  CT chest 05/31/2015. FINDINGS: The lungs are clear. Heart size is normal. Right aortic arch is noted. No pneumothorax or pleural effusion. No acute bony abnormality. IMPRESSION: No acute disease. Electronically Signed   By: Inge Rise M.D.   On: 01/27/2017 14:47    EKG:   Orders placed or performed in visit on 09/25/16  . EKG 12-Lead    ASSESSMENT AND PLAN:   #1 acute left-sided colitis, failed outpatient therapy, c improving, discharged with Cipro, Flagyl for 5 days.  Patient already has Phenergan, Zofran for nausea advised to continue that as needed.. 2. diabetes mellitus type 2: Recent is on metformin, restarted that.   3. Acute on  Chronic kidney disease stage III: She will follows up with Dr. Alva Garnet an outpatient.  Received IV fluids.   4.  Bipolar  disorder:, Anxiety: Continue Celexa, Lunesta, trazodone. #5 history of CAD status post CABG in 2015: Continue aspirin, statins, beta-blockers, nitrates 6.  History of ischemic stroke: Continue aspirin, Plavix. 7.  History of migraines, history of multiple sclerosis as per neurology note. #8 essential hypertension, BP slightly on the low side today adjusted the BP medicines discussed this with patient. Nausea chronic likely secondary to polypharmacy with Neurontin, Lunesta, trazodone, Celexa, Flexeril, pain medicines, discussed this with patient.   Patient is able to eat well, watching TV, had a BM . 5.  All the records are reviewed and case discussed with Care Management/Social Workerr. Management plans discussed with the patient, family and they are in agreement.  CODE STATUS: full  TOTAL TIME TAKING CARE OF THIS PATIENT;84minutes.   POSSIBLE D/C IN 1-2 DAYS, DEPENDING ON CLINICAL CONDITION.   Epifanio Lesches M.D on 01/29/2017 at 10:22 AM  Between 7am to 6pm - Pager - [phone]  After 6pm go to [email or web address] - password [email or web address] [facility]  Office  [phone]  CC: Primary care physician; [name], MD   Note: This dictation was prepared with Dragon dictation along with smaller phrase technology. Any transcriptional errors that result from this process are unintentional.

## 2017-01-29 NOTE — Care Management Important Message (Signed)
Important Message  Patient Details  Name: [name] MRN: [id number] Date of Birth: 06-20-1970   Medicare Important Message Given:  N/A - LOS <3 / Initial given by admissions    Beverly Sessions, RN 01/29/2017, 3:03 PM

## 2017-01-29 NOTE — Progress Notes (Signed)
Per MD okay for RN to place discharge order.

## 2017-02-01 ENCOUNTER — Telehealth: Payer: Self-pay | Admitting: Family Medicine

## 2017-02-01 ENCOUNTER — Ambulatory Visit (INDEPENDENT_AMBULATORY_CARE_PROVIDER_SITE_OTHER): Payer: Medicare Other | Admitting: Family Medicine

## 2017-02-01 ENCOUNTER — Encounter: Payer: Self-pay | Admitting: Family Medicine

## 2017-02-01 VITALS — BP 140/90 | HR 96 | Temp 98.6°F | Resp 16 | Wt 162.0 lb

## 2017-02-01 DIAGNOSIS — I2510 Atherosclerotic heart disease of native coronary artery without angina pectoris: Secondary | ICD-10-CM | POA: Diagnosis not present

## 2017-02-01 DIAGNOSIS — R300 Dysuria: Secondary | ICD-10-CM | POA: Diagnosis not present

## 2017-02-01 DIAGNOSIS — R102 Pelvic and perineal pain: Secondary | ICD-10-CM | POA: Diagnosis not present

## 2017-02-01 LAB — POCT URINALYSIS DIPSTICK
Bilirubin, UA: NEGATIVE
Blood, UA: NEGATIVE
GLUCOSE UA: 250
Ketones, UA: NEGATIVE
LEUKOCYTES UA: NEGATIVE
Nitrite, UA: NEGATIVE
Protein, UA: NEGATIVE
SPEC GRAV UA: 1.01 (ref 1.010–1.025)
Urobilinogen, UA: 0.2 E.U./dL
pH, UA: 6 (ref 5.0–8.0)

## 2017-02-01 LAB — CULTURE, BLOOD (ROUTINE X 2)
CULTURE: NO GROWTH
Culture: NO GROWTH
SPECIAL REQUESTS: ADEQUATE
SPECIAL REQUESTS: ADEQUATE

## 2017-02-01 MED ORDER — HYOSCYAMINE SULFATE 0.125 MG SL SUBL: 0.1250 mg | SUBLINGUAL_TABLET | Freq: Four times a day (QID) | SUBLINGUAL | 0 refills | Status: DC | PRN

## 2017-02-01 MED ORDER — HYOSCYAMINE SULFATE ER 0.375 MG PO TB12: 0.3750 mg | ORAL_TABLET | Freq: Two times a day (BID) | ORAL | 0 refills | Status: DC

## 2017-02-01 NOTE — Telephone Encounter (Signed)
Pt's insurance will not cover the two new medications sent over.  Pt reqeusting new mediation sent to [facility].

## 2017-02-01 NOTE — Progress Notes (Signed)
Patient: [name] Female    DOB: 21-Nov-1970   46 y.o.   MRN: [id number] Visit Date: 02/01/2017  Today's Provider: [name], MD   Chief Complaint  Patient presents with  . Hospitalization Follow-up   Subjective:    HPI   Follow up Hospitalization  Patient was admitted to [facility] on 01/27/2017 and discharged on 01/29/2017. She was treated for acute left-sided colitis, LLQ abdominal pain. Treatment for this included; treated with IV Cipro/p.o. Flagyl 5-7 day course.  She reports good compliance with treatment. She reports this condition is Unchanged. Patient is still having LLQ abdominal pain along with back pain. Patient is also having pain when she urinates. She states she was having the pain while in the hospital, but was told everything was fine.  States that pain was originally in suprapubic region, but spread to right side while in hospital. States that diarrhea has resolved, but states stool has been dark in appearance since yesterday.  ------------------------------------------------------------------------------------     Allergies  Allergen Reactions  . Tramadol     Mouth feels like it's on fire     Current Outpatient Medications:  .  ALPRAZolam (XANAX) 0.5 MG tablet, Take 0.5-1 tablets (0.25-0.5 mg total) by mouth every 6 (six) hours as needed for anxiety. (Patient taking differently: Take 0.5-1 mg by mouth every 6 (six) hours as needed for anxiety. ), Disp: 60 tablet, Rfl: 5 .  aspirin EC 81 MG tablet, Take 1 tablet (81 mg total) by mouth daily., Disp: , Rfl:  .  ciprofloxacin (CIPRO) 500 MG tablet, Take 1 tablet (500 mg total) by mouth 2 (two) times daily for 10 days., Disp: 14 tablet, Rfl: 0 .  citalopram (CELEXA) 40 MG tablet, Take 1 tablet (40 mg total) by mouth daily., Disp: 30 tablet, Rfl: 11 .  clopidogrel (PLAVIX) 75 MG tablet, Take 1 tablet (75 mg total) by mouth daily., Disp: 30 tablet, Rfl: 11 .  diltiazem (CARDIZEM CD) 120 MG 24 hr  capsule, Take 1 capsule (120 mg total) by mouth daily., Disp: 90 capsule, Rfl: 3 .  ezetimibe (ZETIA) 10 MG tablet, Take 1 tablet (10 mg total) daily by mouth., Disp: 90 tablet, Rfl: 3 .  gabapentin (NEURONTIN) 600 MG tablet, Take one every morning and two every evening (Patient taking differently: Take 600-1,200 mg by mouth 2 (two) times daily. Take 600 mg in the morning and 1200 mg in the evening.), Disp: 90 tablet, Rfl: 3 .  isosorbide mononitrate (IMDUR) 60 MG 24 hr tablet, Take 1 tablet (60 mg total) by mouth daily., Disp: 30 tablet, Rfl: 6 .  levothyroxine (SYNTHROID, LEVOTHROID) 25 MCG tablet, Take 1 tablet (25 mcg total) daily before breakfast by mouth., Disp: 30 tablet, Rfl: 12 .  losartan (COZAAR) 25 MG tablet, Take 1 tablet (25 mg total) by mouth daily., Disp: 30 tablet, Rfl: 0 .  metFORMIN (GLUCOPHAGE) 1000 MG tablet, TAKE ONE TABLET BY MOUTH TWICE DAILY, Disp: 60 tablet, Rfl: 12 .  metoprolol succinate (TOPROL-XL) 25 MG 24 hr tablet, Take 0.5 tablets (12.5 mg total) by mouth daily., Disp: 30 tablet, Rfl: 0 .  metroNIDAZOLE (FLAGYL) 500 MG tablet, Take 1 tablet (500 mg total) by mouth 3 (three) times daily., Disp: 15 tablet, Rfl: 0 .  nitroGLYCERIN (NITROSTAT) 0.4 MG SL tablet, Place 1 tablet (0.4 mg total) under the tongue every 5 (five) minutes as needed for chest pain., Disp: 25 tablet, Rfl: 6 .  ondansetron (ZOFRAN ODT) 4  MG disintegrating tablet, Take 1 tablet (4 mg total) by mouth every 8 (eight) hours as needed for nausea or vomiting. (Patient not taking: Reported on 01/27/2017), Disp: 20 tablet, Rfl: 0 .  oxyCODONE-acetaminophen (PERCOCET) 5-325 MG tablet, Take 1-2 tablets by mouth every 6 (six) hours as needed., Disp: 20 tablet, Rfl: 0 .  pantoprazole (PROTONIX) 40 MG tablet, Take 1 tablet (40 mg total) by mouth 2 (two) times daily., Disp: 60 tablet, Rfl: 5 .  promethazine (PHENERGAN) 25 MG tablet, Take 1 tablet (25 mg total) by mouth every 6 (six) hours as needed for nausea or  vomiting. Reported on 05/31/2015, Disp: 30 tablet, Rfl: 3 .  rosuvastatin (CRESTOR) 40 MG tablet, Take 1 tablet (40 mg total) daily at 6 PM by mouth., Disp: 3 tablet, Rfl: 3 .  tiZANidine (ZANAFLEX) 4 MG tablet, Take 4 mg by mouth 2 (two) times daily as needed (migraines)., Disp: , Rfl:  .  traZODone (DESYREL) 100 MG tablet, Take 2 tablets (200 mg total) by mouth at bedtime., Disp: 30 tablet, Rfl: 6 .  zolpidem (AMBIEN) 5 MG tablet, Take 1-2 tablets (5-10 mg total) by mouth at bedtime as needed for sleep., Disp: 40 tablet, Rfl: 3  Review of Systems  Constitutional: Positive for chills, diaphoresis and fatigue. Negative for appetite change and fever.  Respiratory: Negative for chest tightness and shortness of breath.   Cardiovascular: Negative for chest pain and palpitations.  Gastrointestinal: Positive for abdominal pain and nausea. Negative for vomiting.  Genitourinary: Positive for dysuria.  Musculoskeletal: Positive for back pain.  Neurological: Negative for dizziness and weakness.    Social History   Tobacco Use  . Smoking status: Former Smoker    Types: Cigarettes    Last attempt to quit: 08/31/1994    Years since quitting: 22.4  . Smokeless tobacco: Never Used  . Tobacco comment: quit 28 years ago  Substance Use Topics  . Alcohol use: No    Alcohol/week: 0.0 oz    Past Surgical History:  Procedure Laterality Date  . APPENDECTOMY    . CARDIAC CATHETERIZATION N/A 11/09/2014   Procedure: Coronary Angiography;  Surgeon: Minna Merritts, MD;  Location: [place] [facility];  Service: Cardiovascular;  Laterality: N/A;  . CARDIAC CATHETERIZATION N/A 11/12/2014   Procedure: Coronary Stent Intervention;  Surgeon: Isaias Cowman, MD;  Location: [place] [facility];  Service: Cardiovascular;  Laterality: N/A;  . CARDIAC CATHETERIZATION N/A 04/18/2015   Procedure: Left Heart Cath and Coronary Angiography;  Surgeon: Minna Merritts, MD;  Location: [place] [facility];  Service:  Cardiovascular;  Laterality: N/A;  . CARDIAC CATHETERIZATION Left 06/04/2015   Procedure: Left Heart Cath and Coronary Angiography;  Surgeon: Wellington Hampshire, MD;  Location: [place] [facility];  Service: Cardiovascular;  Laterality: Left;  . CARDIAC CATHETERIZATION N/A 06/04/2015   Procedure: Coronary Stent Intervention;  Surgeon: Wellington Hampshire, MD;  Location: [place] [facility];  Service: Cardiovascular;  Laterality: N/A;  . CESAREAN SECTION  2001  . CHOLECYSTECTOMY N/A 11/18/2016   Procedure: LAPAROSCOPIC CHOLECYSTECTOMY WITH INTRAOPERATIVE CHOLANGIOGRAM;  Surgeon: Christene Lye, MD;  Location: [facility];  Service: General;  Laterality: N/A;  . COLONOSCOPY WITH PROPOFOL N/A 04/27/2016   Procedure: COLONOSCOPY WITH PROPOFOL;  Surgeon: [name], MD;  Location: [place];  Service: Endoscopy;  Laterality: N/A;  . CORONARY ANGIOPLASTY    . DILATION AND CURETTAGE OF UTERUS    . ESOPHAGOGASTRODUODENOSCOPY (EGD) WITH PROPOFOL N/A 09/14/2014   Procedure: ESOPHAGOGASTRODUODENOSCOPY (EGD)  WITH PROPOFOL;  Surgeon: Josefine Class, MD;  [facility];  Service: Endoscopy;  Laterality: N/A;  . ESOPHAGOGASTRODUODENOSCOPY (EGD) WITH PROPOFOL N/A 04/27/2016   Procedure: ESOPHAGOGASTRODUODENOSCOPY (EGD) WITH PROPOFOL;  Surgeon: [name], MD;  Location: [place];  Service: Endoscopy;  Laterality: N/A;  Diabetic - oral meds  . GASTRIC BYPASS  09/2009   [facility]   . Left Carotid to sublcavian artery bypass w/ subclavian artery ligation     a. Performed @ [place].  . MELANOMA EXCISION  2016   Dr. Evorn Gong  . [place]  2002  . RIGHT OOPHORECTOMY    . SHOULDER ARTHROSCOPY WITH OPEN ROTATOR CUFF REPAIR Right 01/07/2016   Procedure: SHOULDER ARTHROSCOPY WITH DEBRIDMENT, SUBACHROMIAL DECOMPRESSION;  Surgeon: Corky Mull, MD;  Location: [facility];  Service: Orthopedics;  Laterality: Right;  . TRIGGER FINGER RELEASE Right     Middle Finger      Objective:   BP 140/90 (BP Location: Right Arm, Patient Position: Sitting, Cuff Size: Normal)   Pulse 96   Temp 98.6 F (37 C) (Oral)   Resp 16   Wt 162 lb (73.5 kg)   SpO2 99% Comment: room air  BMI 29.63 kg/m  There were no vitals filed for this visit.   Physical Exam  General Appearance:    Alert, cooperative, no distress  Eyes:    PERRL, conjunctiva/corneas clear, EOM's intact       Lungs:     Clear to auscultation bilaterally, respirations unlabored  Heart:    Regular rate and rhythm  Abdomen:   bowel sounds present and normal in all 4 quadrants, soft. Moderate suprapubic tenderness. Slight epigastric tendnerness . No CVA tenderness      Results for orders placed or performed in visit on 02/01/17  POCT Urinalysis Dipstick  Result Value Ref Range   Color, UA yelow    Clarity, UA clear    Glucose, UA 250    Bilirubin, UA negative    Ketones, UA negative    Spec Grav, UA 1.010 1.010 - 1.025   Blood, UA negative    pH, UA 6.0 5.0 - 8.0   Protein, UA negative    Urobilinogen, UA 0.2 0.2 or 1.0 E.U./dL   Nitrite, UA negative    Leukocytes, UA Negative Negative   Appearance clear    Odor         Assessment & Plan:     1. Dysuria  - POCT Urinalysis Dipstick  2. Suprapubic abdominal pain Persistent for nearly 3 weeks. Diarrhea has resolved. Unclear if this is entirely GI in nature, or there is GU component. Is to finish antibiotic prescribed from [facility], try hyoscyamine (LEVBID) 0.375 MG 12 hr tablet; Take 1 tablet (0.375 mg total) by mouth 2 (two) times daily.  Dispense: 60 tablet; Refill: 0  Follow up GI as scheduled. Consider early follow up with urology if not steadily improving over the next few weeks. Continue BID pantoprazole.        [name], MD  [facility]

## 2017-02-01 NOTE — Progress Notes (Deleted)
Cardiology Office Note Date:  02/01/2017  Patient ID:  [name], [name] 08/20/1970, MRN [id number] PCP:  [name], MD  Cardiologist:  Dr. [name], MD  ***refresh   Chief Complaint: ***  History of Present Illness: [name] is a 46 y.o. female with history of CAD s/p PCI/DES as below, prior strokes, s/p gastric bypass, dysphagia, aortic arch aneurysm, aberrant left subclavian artery s/p left carotid to subclavian artery bypass with subclavian artery ligation done at [facility], CKD stage III-IV, multiple sclerosis, orthostatic hypotension, HTN, HLD, prior syncope, hypothyroidism, migraine disorder, anxiety, and depression who presents for  She has a history of chest pain dating back to 2005 with cardiac cath in July 2005 showed nonobstructive CAD with mild plaque in the LAD. Nuclear stress test in 2012 in the setting of chest pain showed no ischemia. Echo 05/2012 EF 50-55%, otherwise normal study. Stress test 11/2012 without evidence of ischemia. Echo 01/2013 EF 40-45%. She underwent CT in 11/2013 showing right aortic arch with a large retro-esophageal vascular segment with persistent dorsal aortic segment giving off an aberrant left subclavian artery, and is s/p left carotid to subclavian artery bypass with subclavian artery ligation. The large caliber retroaortic segment measured 18 mm compared to the ascending aorta measured 22 mm. This large retroesophageal aortic segment along with the partially calcified atretic ductus ligament between the left aberrant subclavian artery and the left pulmonary artery created an almost complete vascular ring. This was felt to be the likely cause of patient's prior dysphagia. She was admitted to [facility] in 11/2014 with chest pain. Troponin with a peak of 0.04. She underwent nuclear stress test that was overall suboptimal due to GI uptake, EF 45-54%, no ST segment changes, low risk. She continued to have chest pain and underwent cardiac cath that  showed proximal LAD 85% stenosis s/p PCI/DES, distal LAD 30% stenosis, proximal LCx 30%. In follow up on 11/23/14 she reported palpitations, 48 hour Holter showed sinus rhythm with rare PACs and PVCs. Echo 12/2014 showed EF 45-50%, unable to exclude hypokinesis of the anteroseptal and anterior myocardium. Repeat echo in 03/2015 showed EF 50-55%, normal wall motion, normal LV diastolic function, mild MR, mildly dilated LA. Cardiac cath in 04/2015 showed a widely patent LAD stent, proximal LAD 30% stenosed, distal LAD 50% stenosed, proximal LCx 40% stenosed, D1 70% stenosed. Medical management was advised. Repeat LHC in 06/2015 for persistent chest pain showed widely patent LAD stent, proximal LAD 30% stenosis, distal LAD 50% stenosis, 40% proximal LCx stenosis, D1 80% stenosed s/p successful PCI/DES. Echo from 10/2015 showed EF 55-60%, no RWMA, normal LV diastolic function parameters. She was admitted 11/2016 for acute cholecystitis, underwent successful laproscopic cholecystectomy. She was seen in the ED on 12/21 with lower abdominal pain with persistent diarrhea. CT abdomen/pelvis with mild left-sided colitis and nonobstructing stone in the left kidney. This was followed by admission on 12/26 with continued abdominal pain, felt to be infection in etiology. Treated with Cipro and Flagyl. Seen by PCP on 12/31 with persisting abdminal pain, nausea, and now dysuria.   ***   Past Medical History:  Diagnosis Date  . Anemia    iron deficiency anemia  . Anxiety   . Aortic arch aneurysm ([place])   . Arthritis   . Bipolar disorder ([place])   . BRCA negative 2014  . CAD (coronary artery disease)    a. cath 08/2003 nonobs dzs;  b. 11/2014: mLAD 85-90% s/p PCI/DES (Xience 3.25 x 23); c. cath  04/2015: 70% mD1 @ bifurcation. Patent LAD stent - > Med Rx.; d. Cath-PCI 5/17: Patent LAD stent, D1 now ~80% - PCI Resolute DES 2.25 x 12.    . Chronic systolic CHF (congestive heart failure) ([place])    a. echo 01/29/2013 EF 40-45%,  basal anteroseptal & mid anteroseptal segments abnl; b echo 05/2012 EF 50-55%, mildly dilated LA, nl RVSP; c. 10/2015 Echo: EF 55-60%, mild conc LVH, no rwma.  . CKD (chronic kidney disease), stage IV ([place])   . Colon polyp   . CVA (cerebral vascular accident) ([place])    a. 06/2008-TEE nl LV fxn; b. 01/2013 - notes indicate short run of SVT at that time  . Depression   . Diabetes mellitus without complication ([place])   . Family history of breast cancer    BRCA neg 2014  . Gastric ulcer 04/27/2011  . GERD (gastroesophageal reflux disease)   . Headache    Migraines  . Hyperlipidemia   . Hypertension   . Hypertensive heart disease   . Hypothyroidism   . Ischemic cardiomyopathy    a. EF prev 40-45% in 2014-->Improved to 55-60% (10/2015).  . Malignant melanoma of skin of scalp ([place])   . MI, acute, non ST segment elevation ([place])   . Morbid obesity ([place])    a. s/p gastric bypass.  . Multiple sclerosis ([place]) 2015  . Myocardial infarction ([place]) 06/03/2015  . Neuromuscular disorder ([place])   . Neuropathy   . Orthostatic hypotension   . S/P drug eluting coronary stent placement 06/04/2015  . Syncope     Past Surgical History:  Procedure Laterality Date  . APPENDECTOMY    . CARDIAC CATHETERIZATION N/A 11/09/2014   Procedure: Coronary Angiography;  Surgeon: Minna Merritts, MD;  Location: [place] [facility];  Service: Cardiovascular;  Laterality: N/A;  . CARDIAC CATHETERIZATION N/A 11/12/2014   Procedure: Coronary Stent Intervention;  Surgeon: Isaias Cowman, MD;  Location: [place] [facility];  Service: Cardiovascular;  Laterality: N/A;  . CARDIAC CATHETERIZATION N/A 04/18/2015   Procedure: Left Heart Cath and Coronary Angiography;  Surgeon: Minna Merritts, MD;  Location: [place] [facility];  Service: Cardiovascular;  Laterality: N/A;  . CARDIAC CATHETERIZATION Left 06/04/2015   Procedure: Left Heart Cath and Coronary Angiography;  Surgeon: Wellington Hampshire, MD;  Location: [place] [facility];  Service: Cardiovascular;  Laterality: Left;  . CARDIAC CATHETERIZATION N/A 06/04/2015   Procedure: Coronary Stent Intervention;  Surgeon: Wellington Hampshire, MD;  Location: [place] [facility];  Service: Cardiovascular;  Laterality: N/A;  . CESAREAN SECTION  2001  . CHOLECYSTECTOMY N/A 11/18/2016   Procedure: LAPAROSCOPIC CHOLECYSTECTOMY WITH INTRAOPERATIVE CHOLANGIOGRAM;  Surgeon: Christene Lye, MD;  Location: [facility];  Service: General;  Laterality: N/A;  . COLONOSCOPY WITH PROPOFOL N/A 04/27/2016   Procedure: COLONOSCOPY WITH PROPOFOL;  Surgeon: [name], MD;  Location: [place];  Service: Endoscopy;  Laterality: N/A;  . CORONARY ANGIOPLASTY    . DILATION AND CURETTAGE OF UTERUS    . ESOPHAGOGASTRODUODENOSCOPY (EGD) WITH PROPOFOL N/A 09/14/2014   Procedure: ESOPHAGOGASTRODUODENOSCOPY (EGD) WITH PROPOFOL;  Surgeon: Josefine Class, MD;  Location: [facility];  Service: Endoscopy;  Laterality: N/A;  . ESOPHAGOGASTRODUODENOSCOPY (EGD) WITH PROPOFOL N/A 04/27/2016   Procedure: ESOPHAGOGASTRODUODENOSCOPY (EGD) WITH PROPOFOL;  Surgeon: [name], MD;  Location: [place];  Service: Endoscopy;  Laterality: N/A;  Diabetic - oral meds  . GASTRIC BYPASS  09/2009   [facility]   . Left Carotid  to sublcavian artery bypass w/ subclavian artery ligation     a. Performed @ [place].  . MELANOMA EXCISION  2016   Dr. Evorn Gong  . [place]  2002  . RIGHT OOPHORECTOMY    . SHOULDER ARTHROSCOPY WITH OPEN ROTATOR CUFF REPAIR Right 01/07/2016   Procedure: SHOULDER ARTHROSCOPY WITH DEBRIDMENT, SUBACHROMIAL DECOMPRESSION;  Surgeon: Corky Mull, MD;  Location: [facility];  Service: Orthopedics;  Laterality: Right;  . TRIGGER FINGER RELEASE Right     [place]    No outpatient medications have been marked as taking for the 02/03/17 encounter (Appointment) with Rise Mu, PA-C.    Allergies:   Tramadol   Social History:  The patient  reports  that she quit smoking about 22 years ago. Her smoking use included cigarettes. she has never used smokeless tobacco. She reports that she does not drink alcohol or use drugs.   Family History:  The patient's family history includes Anxiety disorder in her father and mother; Bipolar disorder in her mother; Breast cancer in her other; Breast cancer (age of onset: 61) in her maternal aunt and maternal aunt; Colon cancer in her cousin and father; Depression in her father, mother, and sister; Diabetes in her father and sister; Heart disease in her father and mother; Hyperlipidemia in her mother and sister; Hypertension in her father, mother, sister, and sister; Kidney disease in her father and sister; Ovarian cancer in her cousin; Skin cancer in her father; Stroke in her father; Thyroid nodules in her sister.  ROS:   ROS   PHYSICAL EXAM: *** VS:  There were no vitals taken for this visit. BMI: There is no height or weight on file to calculate BMI.  Physical Exam   EKG:  Was ordered and interpreted by me today. Shows ***  Recent Labs: 12/08/2016: TSH 1.53 01/27/2017: ALT 20 01/28/2017: BUN 21; Creatinine, Ser 1.81; Potassium 3.4; Sodium 140 01/29/2017: Hemoglobin 10.6; Platelets 137  05/28/2016: LDL Calculated 95 12/08/2016: Cholesterol 162; HDL 34; Total CHOL/HDL Ratio 4.8; Triglycerides 146   Estimated Creatinine Clearance: 35.7 mL/min (A) (by C-G formula based on SCr of 1.81 mg/dL (H)).   Wt Readings from Last 3 Encounters:  02/01/17 162 lb (73.5 kg)  01/27/17 163 lb (73.9 kg)  01/22/17 163 lb (73.9 kg)     Other studies reviewed: Additional studies/records reviewed today include: summarized above  ASSESSMENT AND PLAN:  1. ***  Disposition: F/u with *** in   Current medicines are reviewed at length with the patient today.  The patient did not have any concerns regarding medicines.  Melvern Banker PA-C 02/01/2017 2:12 PM     [facility] [place]  [address] [place], [place] [address] [phone]

## 2017-02-03 ENCOUNTER — Telehealth: Payer: Self-pay | Admitting: Cardiovascular Disease

## 2017-02-03 ENCOUNTER — Ambulatory Visit (INDEPENDENT_AMBULATORY_CARE_PROVIDER_SITE_OTHER): Payer: Medicare Other

## 2017-02-03 ENCOUNTER — Ambulatory Visit: Payer: Medicare Other | Admitting: Physician Assistant

## 2017-02-03 ENCOUNTER — Telehealth: Payer: Self-pay | Admitting: Family Medicine

## 2017-02-03 VITALS — BP 130/82 | HR 86 | Resp 16

## 2017-02-03 DIAGNOSIS — I10 Essential (primary) hypertension: Secondary | ICD-10-CM

## 2017-02-03 MED ORDER — FLUCONAZOLE 150 MG PO TABS: 150.0000 mg | ORAL_TABLET | Freq: Once | ORAL | 0 refills | Status: AC

## 2017-02-03 NOTE — Discharge Summary (Signed)
[name] [address] [name], is a 47 y.o. female  DOB 1970-07-19  MRN [id number].  Admission date:  01/27/2017  Admitting Physician  Gorden Harms, MD  Discharge Date:  01/29/2018   Primary MD  [name], MD  Recommendations for primary care physician for things to follow:   Up with Dr. Esmond Plants in 1 week Follow-up with PCP in 10 days   Admission Diagnosis  Dehydration [E86.0] Left lower quadrant pain [R10.32]   Discharge Diagnosis  Dehydration [E86.0] Left lower quadrant pain [R10.32]    Active Problems:   Acute colitis      Past Medical History:  Diagnosis Date  . Anemia    iron deficiency anemia  . Anxiety   . Aortic arch aneurysm ([place])   . Arthritis   . Bipolar disorder ([place])   . BRCA negative 2014  . CAD (coronary artery disease)    a. cath 08/2003 nonobs dzs;  b. 11/2014: mLAD 85-90% s/p PCI/DES (Xience 3.25 x 23); c. cath 04/2015: 70% mD1 @ bifurcation. Patent LAD stent - > Med Rx.; d. Cath-PCI 5/17: Patent LAD stent, D1 now ~80% - PCI Resolute DES 2.25 x 12.    . Chronic systolic CHF (congestive heart failure) ([place])    a. echo 01/29/2013 EF 40-45%, basal anteroseptal & mid anteroseptal segments abnl; b echo 05/2012 EF 50-55%, mildly dilated LA, nl RVSP; c. 10/2015 Echo: EF 55-60%, mild conc LVH, no rwma.  . CKD (chronic kidney disease), stage IV ([place])   . Colon polyp   . CVA (cerebral vascular accident) ([place])    a. 06/2008-TEE nl LV fxn; b. 01/2013 - notes indicate short run of SVT at that time  . Depression   . Diabetes mellitus without complication ([place])   . Family history of breast cancer    BRCA neg 2014  . Gastric ulcer 04/27/2011  . GERD (gastroesophageal reflux disease)   . Headache    Migraines  . Hyperlipidemia   . Hypertension   . Hypertensive heart disease   . Hypothyroidism   .  Ischemic cardiomyopathy    a. EF prev 40-45% in 2014-->Improved to 55-60% (10/2015).  . Malignant melanoma of skin of scalp ([place])   . MI, acute, non ST segment elevation ([place])   . Morbid obesity ([place])    a. s/p gastric bypass.  . Multiple sclerosis ([place]) 2015  . Myocardial infarction ([place]) 06/03/2015  . Neuromuscular disorder ([place])   . Neuropathy   . Orthostatic hypotension   . S/P drug eluting coronary stent placement 06/04/2015  . Syncope     Past Surgical History:  Procedure Laterality Date  . APPENDECTOMY    . CARDIAC CATHETERIZATION N/A 11/09/2014   Procedure: Coronary Angiography;  Surgeon: Minna Merritts, MD;  Location: [facility];  Service: Cardiovascular;  Laterality: N/A;  . CARDIAC CATHETERIZATION N/A 11/12/2014   Procedure: Coronary Stent Intervention;  Surgeon: Isaias Cowman, MD;  Location: [facility];  Service: Cardiovascular;  Laterality: N/A;  . CARDIAC CATHETERIZATION N/A 04/18/2015   Procedure: Left Heart Cath and Coronary Angiography;  Surgeon: Minna Merritts, MD;  Location: [facility];  Service: Cardiovascular;  Laterality: N/A;  . CARDIAC CATHETERIZATION Left 06/04/2015   Procedure: Left Heart Cath and Coronary Angiography;  Surgeon: Wellington Hampshire, MD;  Location: [facility];  Service: Cardiovascular;  Laterality: Left;  . CARDIAC CATHETERIZATION N/A 06/04/2015   Procedure: Coronary Stent Intervention;  Surgeon: Wellington Hampshire, MD;  Location: [facility];  Service: Cardiovascular;  Laterality: N/A;  . CESAREAN SECTION  2001  . CHOLECYSTECTOMY N/A 11/18/2016   Procedure: LAPAROSCOPIC CHOLECYSTECTOMY WITH INTRAOPERATIVE CHOLANGIOGRAM;  Surgeon: Christene Lye, MD;  Location: [facility];  Service: General;  Laterality: N/A;  . COLONOSCOPY WITH PROPOFOL N/A 04/27/2016   Procedure: COLONOSCOPY WITH PROPOFOL;  Surgeon: [name], MD;  Location: [facility];  Service: Endoscopy;  Laterality: N/A;  .  CORONARY ANGIOPLASTY    . DILATION AND CURETTAGE OF UTERUS    . ESOPHAGOGASTRODUODENOSCOPY (EGD) WITH PROPOFOL N/A 09/14/2014   Procedure: ESOPHAGOGASTRODUODENOSCOPY (EGD) WITH PROPOFOL;  Surgeon: Josefine Class, MD;  [facility]: [facility];  Service: Endoscopy;  Laterality: N/A;  . ESOPHAGOGASTRODUODENOSCOPY (EGD) WITH PROPOFOL N/A 04/27/2016   Procedure: ESOPHAGOGASTRODUODENOSCOPY (EGD) WITH PROPOFOL;  Surgeon: [name], MD;  Location: [facility];  Service: Endoscopy;  Laterality: N/A;  Diabetic - oral meds  . GASTRIC BYPASS  09/2009   [facility]   . Left Carotid to sublcavian artery bypass w/ subclavian artery ligation     a. Performed @ [facility].  . MELANOMA EXCISION  2016   Dr. Evorn Gong  . [place]  2002  . RIGHT OOPHORECTOMY    . SHOULDER ARTHROSCOPY WITH OPEN ROTATOR CUFF REPAIR Right 01/07/2016   Procedure: SHOULDER ARTHROSCOPY WITH DEBRIDMENT, SUBACHROMIAL DECOMPRESSION;  Surgeon: Corky Mull, MD;  Location: [facility];  Service: Orthopedics;  Laterality: Right;  . TRIGGER FINGER RELEASE Right     [place]       History of present illness and  Hospital Course:     Kindly see H&P for history of present illness and admission details, please review complete Labs, Consult reports and Test reports for all details in brief  HPI  from the history and physical done on the day of admission 47 year old female patient with multiple medical problems of hypertension, hyperlipidemia, chronic pain, neuropathy , Anxiety, chronic kidney disease history admitted because of left lower quadrant abdominal pain, diarrhea, vomiting,, CT abdomen on admission showed splenic flexure colitis and she is admitted for that.  Hospital Course  Acute on chronic abdominal pain with left-sided colitis: Received IV Cipro, Flagyl, patient was taking this medicine as an outpatient but because they are feels she could not tolerate them.  So started on IV Cipro, Flagyl,  she felt better, patient did not have leukocytosis or fever.  But had abdominal pain, nausea, vomiting on admission.  But after starting the fluids, IV antibiotics she felt better, decreased abdominal pain.  Tolerating regular diet.  Discharge her home with Cipro, Flagyl,.  2/DM type II: Patient is on metformin, restarted the metformin 1 g p.o. twice daily at discharge.  Acute on  Chronic kidney disease stage III: She will follows up with Dr. Alva Garnet an outpatient.  Received IV fluids.   4.  Bipolar disorder:, Anxiety: Continue Celexa, Lunesta, trazodone. #5 history of CAD status post CABG in 2015: Continue aspirin, statins, beta-blockers, nitrates 6.  History of ischemic stroke: Continue aspirin, Plavix. 7.  History of migraines, history of multiple sclerosis as per neurology note. #8./ essential hypertension,  continue beta-blockers, losartan, Cardizem.,Imdur  Nausea chronic likely secondary to polypharmacy with Neurontin, Lunesta, trazodone, Celexa, Flexeril, pain medicines, discussed this with patient.        Discharge Condition: Stable   Follow UP  Follow-up Information    [name], MD. Go on 02/01/2017.   Specialty:  Family Medicine Why:  Monday at 10:00am for  hospital follow-up Contact information: [address] [place] [address] [phone]        Minna Merritts, MD. Go on 02/03/2017.   Specialty:  Cardiology Why:  Wednesday at 2:00pm to check BP medication with PA Dunn. Contact information: [place] [address] [phone]             Discharge Instructions  and  Discharge Medications     Allergies as of 01/29/2017      Reactions   Tramadol    Mouth feels like it's on fire      Medication List    STOP taking these medications   naproxen sodium 220 MG tablet Commonly known as:  ALEVE     TAKE these medications   ALPRAZolam 0.5 MG tablet Commonly known as:  XANAX Take 0.5-1 tablets  (0.25-0.5 mg total) by mouth every 6 (six) hours as needed for anxiety. What changed:  how much to take   aspirin EC 81 MG tablet Take 1 tablet (81 mg total) by mouth daily.   ciprofloxacin 500 MG tablet Commonly known as:  CIPRO Take 1 tablet (500 mg total) by mouth 2 (two) times daily for 10 days.   citalopram 40 MG tablet Commonly known as:  CELEXA Take 1 tablet (40 mg total) by mouth daily.   clopidogrel 75 MG tablet Commonly known as:  PLAVIX Take 1 tablet (75 mg total) by mouth daily.   diltiazem 120 MG 24 hr capsule Commonly known as:  CARDIZEM CD Take 1 capsule (120 mg total) by mouth daily.   ezetimibe 10 MG tablet Commonly known as:  ZETIA Take 1 tablet (10 mg total) daily by mouth.   gabapentin 600 MG tablet Commonly known as:  NEURONTIN Take one every morning and two every evening What changed:    how much to take  how to take this  when to take this  additional instructions   isosorbide mononitrate 60 MG 24 hr tablet Commonly known as:  IMDUR Take 1 tablet (60 mg total) by mouth daily.   levothyroxine 25 MCG tablet Commonly known as:  SYNTHROID, LEVOTHROID Take 1 tablet (25 mcg total) daily before breakfast by mouth.   losartan 25 MG tablet Commonly known as:  COZAAR Take 1 tablet (25 mg total) by mouth daily. What changed:    medication strength  how much to take   metFORMIN 1000 MG tablet Commonly known as:  GLUCOPHAGE TAKE ONE TABLET BY MOUTH TWICE DAILY   metoprolol succinate 25 MG 24 hr tablet Commonly known as:  TOPROL-XL Take 0.5 tablets (12.5 mg total) by mouth daily. What changed:    how much to take  when to take this  additional instructions   metroNIDAZOLE 500 MG tablet Commonly known as:  FLAGYL Take 1 tablet (500 mg total) by mouth 3 (three) times daily.   nitroGLYCERIN 0.4 MG SL tablet Commonly known as:  NITROSTAT Place 1 tablet (0.4 mg total) under the tongue every 5 (five) minutes as needed for chest pain.    ondansetron 4 MG disintegrating tablet Commonly known as:  ZOFRAN ODT Take 1 tablet (4 mg total) by mouth every 8 (eight) hours as needed for nausea or vomiting.   oxyCODONE-acetaminophen 5-325 MG tablet Commonly known as:  PERCOCET Take 1-2 tablets by mouth every 6 (six) hours as needed.   pantoprazole 40 MG tablet Commonly known as:  PROTONIX Take 1 tablet (40 mg total) by mouth 2 (two) times  daily.   promethazine 25 MG tablet Commonly known as:  PHENERGAN Take 1 tablet (25 mg total) by mouth every 6 (six) hours as needed for nausea or vomiting. Reported on 05/31/2015   rosuvastatin 40 MG tablet Commonly known as:  CRESTOR Take 1 tablet (40 mg total) daily at 6 PM by mouth.   tiZANidine 4 MG tablet Commonly known as:  ZANAFLEX Take 4 mg by mouth 2 (two) times daily as needed (migraines).   traZODone 100 MG tablet Commonly known as:  DESYREL Take 2 tablets (200 mg total) by mouth at bedtime.   zolpidem 5 MG tablet Commonly known as:  AMBIEN Take 1-2 tablets (5-10 mg total) by mouth at bedtime as needed for sleep.         Diet and Activity recommendation: See Discharge Instructions above   Consults obtained - none   Major procedures and Radiology Reports - PLEASE review detailed and final reports for all details, in brief -      Ct Abdomen Pelvis Wo Contrast  Result Date: 01/27/2017 CLINICAL DATA:  Acute lower abdominal and back pain. EXAM: CT ABDOMEN AND PELVIS WITHOUT CONTRAST TECHNIQUE: Multidetector CT imaging of the abdomen and pelvis was performed following the standard protocol without IV contrast. COMPARISON:  CT scan of January 22, 2017. FINDINGS: Lower chest: No acute abnormality. Hepatobiliary: No focal liver abnormality is seen. Status post cholecystectomy. No biliary dilatation. Pancreas: Unremarkable. No pancreatic ductal dilatation or surrounding inflammatory changes. Spleen: Normal in size without focal abnormality. Adrenals/Urinary Tract: Adrenal  glands appear normal. Nonobstructive left renal calculus is noted. No hydronephrosis or renal obstruction is noted. No ureteral calculi are noted. Urinary bladder is unremarkable. Stomach/Bowel: Status post gastric surgery. There is no evidence of bowel obstruction or inflammation. Status post appendectomy. Vascular/Lymphatic: No significant vascular findings are present. No enlarged abdominal or pelvic lymph nodes. Reproductive: Uterus and bilateral adnexa are unremarkable. Other: No abdominal wall hernia or abnormality. No abdominopelvic ascites. Musculoskeletal: No acute or significant osseous findings. IMPRESSION: Nonobstructive left renal calculus. No hydronephrosis or renal obstruction is noted. Status post gastric surgery, cholecystectomy and appendectomy. No acute abnormality seen in the abdomen or pelvis. Electronically Signed   By: Marijo Conception, M.D.   On: 01/27/2017 15:57   Dg Chest 2 View  Result Date: 01/27/2017 CLINICAL DATA:  Lower abdominal pain radiating into the back with nausea, vomiting and diarrhea for a week. EXAM: CHEST  2 VIEW COMPARISON:  PA and lateral chest 12/13/2015.  CT chest 05/31/2015. FINDINGS: The lungs are clear. Heart size is normal. Right aortic arch is noted. No pneumothorax or pleural effusion. No acute bony abnormality. IMPRESSION: No acute disease. Electronically Signed   By: Inge Rise M.D.   On: 01/27/2017 14:47   Ct Abdomen Pelvis W Contrast  Result Date: 01/22/2017 CLINICAL DATA:  Abdomen pain, diarrhea EXAM: CT ABDOMEN AND PELVIS WITH CONTRAST TECHNIQUE: Multidetector CT imaging of the abdomen and pelvis was performed using the standard protocol following bolus administration of intravenous contrast. CONTRAST:  54m ISOVUE-300 IOPAMIDOL (ISOVUE-300) INJECTION 61% COMPARISON:  10/31/2015 FINDINGS: Lower chest: Lung bases demonstrate no acute consolidation or pleural effusion. Heart size is within normal limits. Hepatobiliary: No focal liver abnormality  is seen. Status post cholecystectomy. No biliary dilatation. Pancreas: Unremarkable. No pancreatic ductal dilatation or surrounding inflammatory changes. Spleen: Normal in size without focal abnormality. Adrenals/Urinary Tract: Adrenal glands are unremarkable. Kidneys are without hydronephrosis. Punctate stone in the lower pole of the left kidney. Bladder within normal limits.  Stomach/Bowel: Status post gastric bypass. No evidence for bowel obstruction. Collapsed appearance of the descending colon with mild thickening at the splenic flexure. There appears to be minimal edema in the fat surrounding the left colon. Nonvisualized appendix consistent with history of appendectomy. Surgical clip in the left lower quadrant. Vascular/Lymphatic: Mild aortic atherosclerosis. No aneurysm. No significant adenopathy Reproductive: Uterus and bilateral adnexa are unremarkable. Other: Negative for free air or free fluid. Tiny fat in the umbilical region. Musculoskeletal: Mild degenerative changes of the spine. No acute or suspicious lesion. IMPRESSION: 1. Collapsed appearance of the descending colon with suspected mild wall thickening at the splenic flexure ; there does appear to be minimal edema surrounding the left colon, and suspect that the findings are secondary to a mild colitis of infectious or inflammatory etiology. 2. Interval cholecystectomy 3. Punctate nonobstructing stone in the left kidney 4. Status post gastric bypass with no evidence for a bowel obstruction Electronically Signed   By: Donavan Foil M.D.   On: 01/22/2017 16:33    Micro Results     Recent Results (from the past 240 hour(s))  Culture, blood (Routine x 2)     Status: None   Collection Time: 01/27/17  2:03 PM  Result Value Ref Range Status   Specimen Description BLOOD  RIGHT FOREARM  Final   Special Requests   Final    BOTTLES DRAWN AEROBIC AND ANAEROBIC Blood Culture adequate volume   Culture   Final    NO GROWTH 5 DAYS Performed at  [facility], [address], [place], [place] [address]    Report Status 02/01/2017 FINAL  Final  Culture, blood (Routine x 2)     Status: None   Collection Time: 01/27/17  2:03 PM  Result Value Ref Range Status   Specimen Description BLOOD RIGHT [facility]  Final   Special Requests   Final    BOTTLES DRAWN AEROBIC AND ANAEROBIC Blood Culture adequate volume   Culture   Final    NO GROWTH 5 DAYS Performed at [facility], [address], [place], [place] [address]    Report Status 02/01/2017 FINAL  Final       Today   Subjective:   [place] today has no headache,no chest abdominal pain,no new weakness tingling or numbness, feels much better wants to go home today.   Objective:   Blood pressure (!) 131/59, pulse 86, temperature 98.3 F (36.8 C), temperature source Oral, resp. rate 14, height '5\' 2"'  (1.575 m), weight 73.9 kg (163 lb), SpO2 100 %.  No intake or output data in the 24 hours ending 02/03/17 1228  Exam Awake Alert, Oriented x 3, No new F.N deficits, Normal affect .AT,PERRAL Supple Neck,No JVD, No cervical lymphadenopathy appriciated.  Symmetrical Chest wall movement, Good air movement bilaterally, CTAB RRR,No Gallops,Rubs or new Murmurs, No Parasternal Heave +ve B.Sounds, Abd Soft, Non tender, No organomegaly appriciated, No rebound -guarding or rigidity. No Cyanosis, Clubbing or edema, No new Rash or bruise  Data Review   CBC w Diff:  Lab Results  Component Value Date   WBC 6.4 01/29/2017   HGB 10.6 (L) 01/29/2017   HGB 12.7 11/15/2015   HCT 31.4 (L) 01/29/2017   HCT 38.2 11/15/2015   PLT 137 (L) 01/29/2017   PLT 276 11/15/2015   LYMPHOPCT 23 01/27/2017   LYMPHOPCT 6.1 12/30/2013   MONOPCT 5 01/27/2017   MONOPCT 1.1 12/30/2013   EOSPCT 3 01/27/2017   EOSPCT 0.1 12/30/2013   BASOPCT  1 01/27/2017   BASOPCT 0.2 12/30/2013    CMP:  Lab Results  Component Value Date   NA 140 01/28/2017   NA 146 (H) 01/29/2016   NA 136  12/30/2013   K 3.4 (L) 01/28/2017   K 4.8 12/30/2013   CL 109 01/28/2017   CL 107 12/30/2013   CO2 23 01/28/2017   CO2 23 12/30/2013   BUN 21 (H) 01/28/2017   BUN 22 01/29/2016   BUN 23 (H) 12/30/2013   CREATININE 1.81 (H) 01/28/2017   CREATININE 1.92 (H) 10/14/2016   GLU 189 02/03/2013   PROT 7.8 01/27/2017   PROT 7.1 11/15/2015   PROT 7.1 12/30/2013   ALBUMIN 4.3 01/27/2017   ALBUMIN 4.1 01/29/2016   ALBUMIN 3.5 12/30/2013   BILITOT 0.8 01/27/2017   BILITOT 0.3 11/15/2015   BILITOT 0.2 12/30/2013   ALKPHOS 138 (H) 01/27/2017   ALKPHOS 87 12/30/2013   AST 21 01/27/2017   AST 14 (L) 12/30/2013   ALT 20 01/27/2017   ALT 33 12/30/2013  .   Total Time in preparing paper work, data evaluation and todays exam - 35 minutes  Epifanio Lesches M.D on 01/29/2018 at 12:28 PM    Note: This dictation was prepared with Dragon dictation along with smaller phrase technology. Any transcriptional errors that result from this process are unintentional.

## 2017-02-03 NOTE — Telephone Encounter (Signed)
Please review. Ok to send in?

## 2017-02-03 NOTE — Telephone Encounter (Addendum)
Spoke with patient and reviewed that we had some changes in the schedule. Offered for her to come in at 2:30 pm for nurse visit and that Dr. [name] would review her blood pressures. She was agreeable with this and confirmed time change with no further questions at this time.

## 2017-02-03 NOTE — Telephone Encounter (Signed)
Patient is requesting refill on Diflucan for yeast infection.   She has been on a lot of antibiotics recently.  Call into [facility] on [address]

## 2017-02-03 NOTE — Patient Instructions (Addendum)
1.) Reason for visit: BP check   2.) Name of MD requesting visit: Dr. [name]  3.) H&P: Pt scheduled to [name], [place] today post 12/28 hospital discharge for left lower quadrant pain. She was not seen by cardiology while admitted. Hospitalist decreased metoprolol to 12.5mg  qd and losartan 25mg  qd due to hypotension. Imdur was d/c'd in hospital for which pt agreed.  PA OV changed to nurse visit for BP check.   4.) ROS related to problem: Pt reports feeling well. She has not decreased metoprolol or losartan as recommended. She is taking losartan 100mg  qd and metoprolol 25mg  qd with an extra 25mg  in the PM PRN. Imdur discontinued.   5.) Assessment and plan per MD: BP 130/82, HR 86.  As vital signs are stable, she will continue to monitor and await further recommendations from MD.

## 2017-02-08 ENCOUNTER — Telehealth: Payer: Self-pay | Admitting: Family Medicine

## 2017-02-08 NOTE — Telephone Encounter (Signed)
What are her  symptoms?  

## 2017-02-08 NOTE — Telephone Encounter (Signed)
Patient states that she has finish the medicine you gave her for yeast infection, but it has not gone, wanted to know if you will call something else  [facility]

## 2017-02-09 MED ORDER — TERCONAZOLE 0.4 % VA CREA: 1.0000 | TOPICAL_CREAM | Freq: Every day | VAGINAL | 0 refills | Status: AC

## 2017-02-09 NOTE — Telephone Encounter (Signed)
Have sent prescription for Terazol cream to [facility].

## 2017-02-09 NOTE — Telephone Encounter (Signed)
Patient was advised.  

## 2017-02-09 NOTE — Telephone Encounter (Signed)
Patient states that she is itching real bad, burning, and cottage cheese discharge.  Patient states that she has used OTC cream to help with itching and it is not helping.  She states that these are the same symptoms she gets every time she is on antibiotics.

## 2017-02-16 DIAGNOSIS — K529 Noninfective gastroenteritis and colitis, unspecified: Secondary | ICD-10-CM | POA: Diagnosis not present

## 2017-02-16 DIAGNOSIS — R1310 Dysphagia, unspecified: Secondary | ICD-10-CM | POA: Diagnosis not present

## 2017-02-16 DIAGNOSIS — K219 Gastro-esophageal reflux disease without esophagitis: Secondary | ICD-10-CM | POA: Diagnosis not present

## 2017-02-16 DIAGNOSIS — R1032 Left lower quadrant pain: Secondary | ICD-10-CM | POA: Diagnosis not present

## 2017-02-16 DIAGNOSIS — R1031 Right lower quadrant pain: Secondary | ICD-10-CM | POA: Diagnosis not present

## 2017-02-16 DIAGNOSIS — Z8371 Family history of colonic polyps: Secondary | ICD-10-CM | POA: Diagnosis not present

## 2017-02-16 DIAGNOSIS — Z800 Family history of malignant neoplasm of digestive organs: Secondary | ICD-10-CM | POA: Diagnosis not present

## 2017-02-16 DIAGNOSIS — R933 Abnormal findings on diagnostic imaging of other parts of digestive tract: Secondary | ICD-10-CM | POA: Diagnosis not present

## 2017-02-17 ENCOUNTER — Telehealth: Payer: Self-pay | Admitting: Cardiovascular Disease

## 2017-02-17 NOTE — Telephone Encounter (Signed)
Spoke with patient and she states that she has had no new symptoms since her last office vist. She reports that her blood pressures have been doing good as well. Patient last office visit 09/25/16 with previous echo 10/28/15 which was normal, Stress test 11/2014 which was normal, and Left heart cath 06/04/15. Let her know that I would route this clearance over to Dr. [name] for review and to give [place] a call if she needs anything else. She was appreciative for the call and had no further questions at this time.

## 2017-02-17 NOTE — Telephone Encounter (Signed)
Acceptable risk for colonoscopy/GI procedure No further cardiac testing needed

## 2017-02-17 NOTE — Telephone Encounter (Signed)
   [facility] Pre-operative Risk Assessment    Request for surgical clearance:  1. What type of surgery is being performed? Colonoscopy and Upper Endoscopy  When is this surgery scheduled? 04-26-2017  2. Are there any medications that need to be held prior to surgery and how long? See request   3. Practice name and name of physician performing surgery? [facility]  4. What is your office phone and fax number? [phone], [phone] fax  5. Anesthesia type (None, local, MAC, general) ?monitored anesthesia   [name] 02/17/2017, 9:40 AM  _________________________________________________________________   (provider comments below)

## 2017-02-17 NOTE — Telephone Encounter (Signed)
Clearance routed to physicians office.

## 2017-02-18 ENCOUNTER — Other Ambulatory Visit
Admission: RE | Admit: 2017-02-18 | Discharge: 2017-02-18 | Disposition: A | Discharge status: Home / Self Care | Payer: Medicare Other | Source: Ambulatory Visit | Attending: Student | Admitting: Student

## 2017-02-18 DIAGNOSIS — K529 Noninfective gastroenteritis and colitis, unspecified: Secondary | ICD-10-CM | POA: Insufficient documentation

## 2017-02-18 LAB — C DIFFICILE QUICK SCREEN W PCR REFLEX
C DIFFICILE (CDIFF) INTERP: NOT DETECTED
C Diff antigen: NEGATIVE
C Diff toxin: NEGATIVE

## 2017-02-18 LAB — GASTROINTESTINAL PANEL BY PCR, STOOL (REPLACES STOOL CULTURE)
ASTROVIRUS: NOT DETECTED
Adenovirus F40/41: NOT DETECTED
CAMPYLOBACTER SPECIES: NOT DETECTED
CRYPTOSPORIDIUM: NOT DETECTED
Cyclospora cayetanensis: NOT DETECTED
ENTAMOEBA HISTOLYTICA: NOT DETECTED
ENTEROAGGREGATIVE E COLI (EAEC): NOT DETECTED
ENTEROPATHOGENIC E COLI (EPEC): NOT DETECTED
ENTEROTOXIGENIC E COLI (ETEC): NOT DETECTED
GIARDIA LAMBLIA: NOT DETECTED
Norovirus GI/GII: NOT DETECTED
Plesimonas shigelloides: NOT DETECTED
Rotavirus A: NOT DETECTED
Salmonella species: NOT DETECTED
Sapovirus (I, II, IV, and V): NOT DETECTED
Shiga like toxin producing E coli (STEC): NOT DETECTED
Shigella/Enteroinvasive E coli (EIEC): NOT DETECTED
VIBRIO CHOLERAE: NOT DETECTED
Vibrio species: NOT DETECTED
YERSINIA ENTEROCOLITICA: NOT DETECTED

## 2017-02-19 DIAGNOSIS — M7521 Bicipital tendinitis, right shoulder: Secondary | ICD-10-CM | POA: Diagnosis not present

## 2017-02-19 DIAGNOSIS — M25511 Pain in right shoulder: Secondary | ICD-10-CM | POA: Diagnosis not present

## 2017-02-19 DIAGNOSIS — M7581 Other shoulder lesions, right shoulder: Secondary | ICD-10-CM | POA: Diagnosis not present

## 2017-02-19 DIAGNOSIS — M19011 Primary osteoarthritis, right shoulder: Secondary | ICD-10-CM | POA: Diagnosis not present

## 2017-02-19 LAB — PANCREATIC ELASTASE, FECAL: Pancreatic Elastase-1, Stool: 313 ug Elast./g (ref >200)

## 2017-02-20 LAB — CALPROTECTIN, FECAL: Calprotectin, Fecal: 41 ug/g (ref 0–120)

## 2017-02-22 ENCOUNTER — Other Ambulatory Visit: Payer: Self-pay | Admitting: Student

## 2017-02-22 DIAGNOSIS — M7581 Other shoulder lesions, right shoulder: Secondary | ICD-10-CM

## 2017-02-22 DIAGNOSIS — M19011 Primary osteoarthritis, right shoulder: Secondary | ICD-10-CM

## 2017-02-22 DIAGNOSIS — M7521 Bicipital tendinitis, right shoulder: Secondary | ICD-10-CM

## 2017-02-24 DIAGNOSIS — F329 Major depressive disorder, single episode, unspecified: Secondary | ICD-10-CM | POA: Diagnosis not present

## 2017-02-24 DIAGNOSIS — F419 Anxiety disorder, unspecified: Secondary | ICD-10-CM | POA: Diagnosis not present

## 2017-02-24 DIAGNOSIS — R5383 Other fatigue: Secondary | ICD-10-CM | POA: Diagnosis not present

## 2017-02-24 DIAGNOSIS — F5104 Psychophysiologic insomnia: Secondary | ICD-10-CM | POA: Diagnosis not present

## 2017-02-24 DIAGNOSIS — R9082 White matter disease, unspecified: Secondary | ICD-10-CM | POA: Diagnosis not present

## 2017-02-24 DIAGNOSIS — R419 Unspecified symptoms and signs involving cognitive functions and awareness: Secondary | ICD-10-CM | POA: Diagnosis not present

## 2017-02-24 DIAGNOSIS — R296 Repeated falls: Secondary | ICD-10-CM | POA: Diagnosis not present

## 2017-02-24 DIAGNOSIS — G43019 Migraine without aura, intractable, without status migrainosus: Secondary | ICD-10-CM | POA: Diagnosis not present

## 2017-02-24 DIAGNOSIS — R5381 Other malaise: Secondary | ICD-10-CM | POA: Diagnosis not present

## 2017-03-03 ENCOUNTER — Ambulatory Visit
Admission: RE | Admit: 2017-03-03 | Discharge: 2017-03-03 | Disposition: A | Discharge status: Home / Self Care | Payer: Medicare Other | Source: Ambulatory Visit | Attending: Student | Admitting: Student

## 2017-03-03 DIAGNOSIS — M7521 Bicipital tendinitis, right shoulder: Secondary | ICD-10-CM | POA: Insufficient documentation

## 2017-03-03 DIAGNOSIS — M75111 Incomplete rotator cuff tear or rupture of right shoulder, not specified as traumatic: Secondary | ICD-10-CM | POA: Diagnosis not present

## 2017-03-03 DIAGNOSIS — M25411 Effusion, right shoulder: Secondary | ICD-10-CM | POA: Diagnosis not present

## 2017-03-03 DIAGNOSIS — M7581 Other shoulder lesions, right shoulder: Secondary | ICD-10-CM | POA: Diagnosis not present

## 2017-03-03 DIAGNOSIS — M19011 Primary osteoarthritis, right shoulder: Secondary | ICD-10-CM

## 2017-03-11 ENCOUNTER — Ambulatory Visit (INDEPENDENT_AMBULATORY_CARE_PROVIDER_SITE_OTHER): Payer: Medicare Other | Admitting: Family Medicine

## 2017-03-11 ENCOUNTER — Encounter: Payer: Self-pay | Admitting: Family Medicine

## 2017-03-11 VITALS — BP 122/80 | HR 75 | Temp 98.8°F | Resp 16 | Wt 166.8 lb

## 2017-03-11 DIAGNOSIS — N3090 Cystitis, unspecified without hematuria: Secondary | ICD-10-CM

## 2017-03-11 LAB — POCT URINALYSIS DIPSTICK
Bilirubin, UA: NEGATIVE
Glucose, UA: NEGATIVE
Ketones, UA: NEGATIVE
NITRITE UA: POSITIVE
PH UA: 5 (ref 5.0–8.0)
PROTEIN UA: NEGATIVE
RBC UA: NEGATIVE
Spec Grav, UA: <=1.005 — AB (ref 1.010–1.025)
UROBILINOGEN UA: 0.2 U/dL

## 2017-03-11 MED ORDER — NITROFURANTOIN MONOHYD MACRO 100 MG PO CAPS: 100.0000 mg | ORAL_CAPSULE | Freq: Two times a day (BID) | ORAL | 0 refills | Status: DC

## 2017-03-11 NOTE — Patient Instructions (Signed)
We will call you with the urine culture results. May use AZO or Uristat to reduce symptoms for a day or two.

## 2017-03-11 NOTE — Progress Notes (Signed)
Subjective:     Patient ID: [name], female   DOB: [birth year], 47 y.o.   MRN: [id number] Chief Complaint  Patient presents with  . Back Pain    Patient comes in office today with complaints of dysuria and lower back pain for the past 2 days.    HPI Reports urinary frequency and burning. Is currently being evaluated for chronic diarrhea.  Review of Systems  Constitutional: Negative for chills and fever.       Objective:   Physical Exam  Constitutional: She appears well-developed and well-nourished. No distress.  Genitourinary:  Genitourinary Comments: No cva tendernness       Assessment:    1. Cystitis: start Macrobid - POCT urinalysis dipstick - Urine Culture    Plan:    Discussed use of urinary anesthetics.

## 2017-03-12 DIAGNOSIS — R5382 Chronic fatigue, unspecified: Secondary | ICD-10-CM | POA: Diagnosis not present

## 2017-03-12 DIAGNOSIS — R9089 Other abnormal findings on diagnostic imaging of central nervous system: Secondary | ICD-10-CM | POA: Diagnosis not present

## 2017-03-13 LAB — URINE CULTURE

## 2017-03-15 ENCOUNTER — Telehealth: Payer: Self-pay | Admitting: Family Medicine

## 2017-03-15 ENCOUNTER — Telehealth: Payer: Self-pay

## 2017-03-15 DIAGNOSIS — M7581 Other shoulder lesions, right shoulder: Secondary | ICD-10-CM | POA: Diagnosis not present

## 2017-03-15 DIAGNOSIS — M24111 Other articular cartilage disorders, right shoulder: Secondary | ICD-10-CM

## 2017-03-15 DIAGNOSIS — M75111 Incomplete rotator cuff tear or rupture of right shoulder, not specified as traumatic: Secondary | ICD-10-CM | POA: Diagnosis not present

## 2017-03-15 HISTORY — DX: Other articular cartilage disorders, right shoulder: M24.111

## 2017-03-15 NOTE — Telephone Encounter (Signed)
Pt advised.  She reports the antibiotic in causing diarrhea and would like to try a different one.  Please sent into [facility] [address]    Thanks,  -Mickel Baas

## 2017-03-15 NOTE — Telephone Encounter (Signed)
Patient is due for follow up appointment with Dr. Caryn Section for DM and cholesterol. I called patient and   scheduled an office visit to discuss mediction changes and referrals along with DM, and cholesterol Follow up. Appt is 03/18/2017 at 8am.

## 2017-03-15 NOTE — Telephone Encounter (Signed)
If her urine symptoms have resolved 3 days treatment may be enough.

## 2017-03-15 NOTE — Telephone Encounter (Signed)
Pt advised.  She reports her symptoms have resolved.  She will call back if they return.   Thanks,   -Mickel Baas

## 2017-03-15 NOTE — Telephone Encounter (Signed)
-----   Message from Carmon Ginsberg, [place] sent at 03/15/2017  7:29 AM EST ----- Continue nitrofurantoin for e. Coli infection.

## 2017-03-15 NOTE — Telephone Encounter (Signed)
Pt requesting referral to psychiatry or chang in medication.  She states she is having a really hard time right now ans has to make herself get up out of bed.

## 2017-03-16 ENCOUNTER — Encounter
Admission: RE | Disposition: A | Discharge status: Home / Self Care | Payer: Self-pay | Source: Ambulatory Visit | Attending: Surgery

## 2017-03-16 ENCOUNTER — Ambulatory Visit: Payer: Medicare Other | Admitting: Anesthesiology

## 2017-03-16 ENCOUNTER — Other Ambulatory Visit: Payer: Self-pay

## 2017-03-16 ENCOUNTER — Encounter: Payer: Self-pay | Admitting: *Deleted

## 2017-03-16 ENCOUNTER — Ambulatory Visit
Admission: RE | Admit: 2017-03-16 | Discharge: 2017-03-16 | Disposition: A | Discharge status: Home / Self Care | Payer: Medicare Other | Source: Ambulatory Visit | Attending: Surgery | Admitting: Surgery

## 2017-03-16 DIAGNOSIS — G35 Multiple sclerosis: Secondary | ICD-10-CM | POA: Diagnosis present

## 2017-03-16 DIAGNOSIS — I110 Hypertensive heart disease with heart failure: Secondary | ICD-10-CM | POA: Insufficient documentation

## 2017-03-16 DIAGNOSIS — Z8711 Personal history of peptic ulcer disease: Secondary | ICD-10-CM | POA: Insufficient documentation

## 2017-03-16 DIAGNOSIS — I252 Old myocardial infarction: Secondary | ICD-10-CM

## 2017-03-16 DIAGNOSIS — J181 Lobar pneumonia, unspecified organism: Secondary | ICD-10-CM | POA: Diagnosis present

## 2017-03-16 DIAGNOSIS — M75111 Incomplete rotator cuff tear or rupture of right shoulder, not specified as traumatic: Secondary | ICD-10-CM | POA: Diagnosis present

## 2017-03-16 DIAGNOSIS — Z87891 Personal history of nicotine dependence: Secondary | ICD-10-CM

## 2017-03-16 DIAGNOSIS — Z79891 Long term (current) use of opiate analgesic: Secondary | ICD-10-CM

## 2017-03-16 DIAGNOSIS — M1990 Unspecified osteoarthritis, unspecified site: Secondary | ICD-10-CM | POA: Diagnosis present

## 2017-03-16 DIAGNOSIS — Z885 Allergy status to narcotic agent status: Secondary | ICD-10-CM | POA: Insufficient documentation

## 2017-03-16 DIAGNOSIS — I69351 Hemiplegia and hemiparesis following cerebral infarction affecting right dominant side: Secondary | ICD-10-CM

## 2017-03-16 DIAGNOSIS — R6521 Severe sepsis with septic shock: Secondary | ICD-10-CM | POA: Diagnosis present

## 2017-03-16 DIAGNOSIS — M24111 Other articular cartilage disorders, right shoulder: Secondary | ICD-10-CM | POA: Diagnosis not present

## 2017-03-16 DIAGNOSIS — M7541 Impingement syndrome of right shoulder: Secondary | ICD-10-CM

## 2017-03-16 DIAGNOSIS — M25511 Pain in right shoulder: Secondary | ICD-10-CM | POA: Diagnosis not present

## 2017-03-16 DIAGNOSIS — F418 Other specified anxiety disorders: Secondary | ICD-10-CM | POA: Diagnosis present

## 2017-03-16 DIAGNOSIS — G43909 Migraine, unspecified, not intractable, without status migrainosus: Secondary | ICD-10-CM | POA: Diagnosis present

## 2017-03-16 DIAGNOSIS — K219 Gastro-esophageal reflux disease without esophagitis: Secondary | ICD-10-CM | POA: Insufficient documentation

## 2017-03-16 DIAGNOSIS — Z79890 Hormone replacement therapy (postmenopausal): Secondary | ICD-10-CM

## 2017-03-16 DIAGNOSIS — E875 Hyperkalemia: Secondary | ICD-10-CM | POA: Diagnosis present

## 2017-03-16 DIAGNOSIS — E872 Acidosis: Secondary | ICD-10-CM | POA: Diagnosis present

## 2017-03-16 DIAGNOSIS — M7521 Bicipital tendinitis, right shoulder: Secondary | ICD-10-CM | POA: Insufficient documentation

## 2017-03-16 DIAGNOSIS — G8918 Other acute postprocedural pain: Secondary | ICD-10-CM | POA: Diagnosis not present

## 2017-03-16 DIAGNOSIS — I69354 Hemiplegia and hemiparesis following cerebral infarction affecting left non-dominant side: Secondary | ICD-10-CM | POA: Diagnosis not present

## 2017-03-16 DIAGNOSIS — Z85820 Personal history of malignant melanoma of skin: Secondary | ICD-10-CM

## 2017-03-16 DIAGNOSIS — N390 Urinary tract infection, site not specified: Secondary | ICD-10-CM | POA: Diagnosis present

## 2017-03-16 DIAGNOSIS — E039 Hypothyroidism, unspecified: Secondary | ICD-10-CM

## 2017-03-16 DIAGNOSIS — E119 Type 2 diabetes mellitus without complications: Secondary | ICD-10-CM

## 2017-03-16 DIAGNOSIS — I2510 Atherosclerotic heart disease of native coronary artery without angina pectoris: Secondary | ICD-10-CM | POA: Insufficient documentation

## 2017-03-16 DIAGNOSIS — T40605A Adverse effect of unspecified narcotics, initial encounter: Secondary | ICD-10-CM | POA: Diagnosis present

## 2017-03-16 DIAGNOSIS — Z955 Presence of coronary angioplasty implant and graft: Secondary | ICD-10-CM

## 2017-03-16 DIAGNOSIS — Z79899 Other long term (current) drug therapy: Secondary | ICD-10-CM

## 2017-03-16 DIAGNOSIS — R4020 Unspecified coma: Secondary | ICD-10-CM | POA: Diagnosis not present

## 2017-03-16 DIAGNOSIS — Z7982 Long term (current) use of aspirin: Secondary | ICD-10-CM

## 2017-03-16 DIAGNOSIS — F419 Anxiety disorder, unspecified: Secondary | ICD-10-CM | POA: Insufficient documentation

## 2017-03-16 DIAGNOSIS — N183 Chronic kidney disease, stage 3 (moderate): Secondary | ICD-10-CM | POA: Diagnosis present

## 2017-03-16 DIAGNOSIS — N289 Disorder of kidney and ureter, unspecified: Secondary | ICD-10-CM | POA: Diagnosis not present

## 2017-03-16 DIAGNOSIS — A419 Sepsis, unspecified organism: Secondary | ICD-10-CM | POA: Diagnosis not present

## 2017-03-16 DIAGNOSIS — I5030 Unspecified diastolic (congestive) heart failure: Secondary | ICD-10-CM | POA: Insufficient documentation

## 2017-03-16 DIAGNOSIS — Z7984 Long term (current) use of oral hypoglycemic drugs: Secondary | ICD-10-CM | POA: Insufficient documentation

## 2017-03-16 DIAGNOSIS — G92 Toxic encephalopathy: Secondary | ICD-10-CM | POA: Diagnosis present

## 2017-03-16 DIAGNOSIS — Z7902 Long term (current) use of antithrombotics/antiplatelets: Secondary | ICD-10-CM

## 2017-03-16 DIAGNOSIS — E782 Mixed hyperlipidemia: Secondary | ICD-10-CM | POA: Diagnosis present

## 2017-03-16 DIAGNOSIS — J9601 Acute respiratory failure with hypoxia: Secondary | ICD-10-CM | POA: Diagnosis present

## 2017-03-16 DIAGNOSIS — F319 Bipolar disorder, unspecified: Secondary | ICD-10-CM

## 2017-03-16 DIAGNOSIS — M7581 Other shoulder lesions, right shoulder: Secondary | ICD-10-CM | POA: Diagnosis not present

## 2017-03-16 DIAGNOSIS — G8929 Other chronic pain: Secondary | ICD-10-CM | POA: Diagnosis present

## 2017-03-16 DIAGNOSIS — Z9884 Bariatric surgery status: Secondary | ICD-10-CM

## 2017-03-16 DIAGNOSIS — E1122 Type 2 diabetes mellitus with diabetic chronic kidney disease: Secondary | ICD-10-CM | POA: Diagnosis present

## 2017-03-16 DIAGNOSIS — I130 Hypertensive heart and chronic kidney disease with heart failure and stage 1 through stage 4 chronic kidney disease, or unspecified chronic kidney disease: Secondary | ICD-10-CM | POA: Diagnosis present

## 2017-03-16 DIAGNOSIS — I712 Thoracic aortic aneurysm, without rupture: Secondary | ICD-10-CM

## 2017-03-16 DIAGNOSIS — G629 Polyneuropathy, unspecified: Secondary | ICD-10-CM

## 2017-03-16 DIAGNOSIS — M75101 Unspecified rotator cuff tear or rupture of right shoulder, not specified as traumatic: Secondary | ICD-10-CM | POA: Diagnosis not present

## 2017-03-16 DIAGNOSIS — E860 Dehydration: Secondary | ICD-10-CM | POA: Diagnosis present

## 2017-03-16 DIAGNOSIS — R531 Weakness: Secondary | ICD-10-CM | POA: Diagnosis not present

## 2017-03-16 DIAGNOSIS — J189 Pneumonia, unspecified organism: Secondary | ICD-10-CM | POA: Diagnosis not present

## 2017-03-16 DIAGNOSIS — E11319 Type 2 diabetes mellitus with unspecified diabetic retinopathy without macular edema: Secondary | ICD-10-CM | POA: Diagnosis present

## 2017-03-16 DIAGNOSIS — N179 Acute kidney failure, unspecified: Secondary | ICD-10-CM | POA: Diagnosis present

## 2017-03-16 DIAGNOSIS — D509 Iron deficiency anemia, unspecified: Secondary | ICD-10-CM | POA: Diagnosis present

## 2017-03-16 DIAGNOSIS — R4701 Aphasia: Secondary | ICD-10-CM | POA: Diagnosis present

## 2017-03-16 DIAGNOSIS — I5032 Chronic diastolic (congestive) heart failure: Secondary | ICD-10-CM | POA: Diagnosis present

## 2017-03-16 DIAGNOSIS — Z853 Personal history of malignant neoplasm of breast: Secondary | ICD-10-CM

## 2017-03-16 DIAGNOSIS — Z8520 Personal history of malignant neoplasm of unspecified respiratory organ: Secondary | ICD-10-CM

## 2017-03-16 DIAGNOSIS — I509 Heart failure, unspecified: Secondary | ICD-10-CM | POA: Diagnosis not present

## 2017-03-16 DIAGNOSIS — R4781 Slurred speech: Secondary | ICD-10-CM | POA: Diagnosis not present

## 2017-03-16 HISTORY — PX: SHOULDER ARTHROSCOPY WITH OPEN ROTATOR CUFF REPAIR: SHX6092

## 2017-03-16 LAB — GLUCOSE, CAPILLARY
GLUCOSE-CAPILLARY: 109 mg/dL — AB (ref 65–99)
Glucose-Capillary: 75 mg/dL (ref 65–99)

## 2017-03-16 LAB — POCT PREGNANCY, URINE: Preg Test, Ur: NEGATIVE

## 2017-03-16 SURGERY — ARTHROSCOPY, SHOULDER WITH REPAIR, ROTATOR CUFF, OPEN
Anesthesia: General | Site: Shoulder | Laterality: Right | Wound class: Clean

## 2017-03-16 MED ORDER — EPHEDRINE SULFATE 50 MG/ML IJ SOLN
INTRAMUSCULAR | Status: DC | PRN
  Administered 2017-03-16: 5 mg via INTRAVENOUS

## 2017-03-16 MED ORDER — FENTANYL CITRATE (PF) 100 MCG/2ML IJ SOLN
INTRAMUSCULAR | Status: AC
  Administered 2017-03-16: 50 ug via INTRAVENOUS
  Filled 2017-03-16: qty 2

## 2017-03-16 MED ORDER — LIDOCAINE HCL (PF) 1 % IJ SOLN
INTRAMUSCULAR | Status: AC
  Filled 2017-03-16: qty 5

## 2017-03-16 MED ORDER — MIDAZOLAM HCL 2 MG/2ML IJ SOLN
INTRAMUSCULAR | Status: AC
  Administered 2017-03-16: 1 mg via INTRAVENOUS
  Filled 2017-03-16: qty 2

## 2017-03-16 MED ORDER — SUGAMMADEX SODIUM 200 MG/2ML IV SOLN
INTRAVENOUS | Status: DC | PRN
  Administered 2017-03-16: 200 mg via INTRAVENOUS

## 2017-03-16 MED ORDER — MORPHINE SULFATE 15 MG PO TABS: 15.0000 mg | ORAL_TABLET | Freq: Four times a day (QID) | ORAL | 0 refills | Status: DC | PRN

## 2017-03-16 MED ORDER — MIDAZOLAM HCL 2 MG/2ML IJ SOLN
INTRAMUSCULAR | Status: DC | PRN
  Administered 2017-03-16: 1 mg via INTRAVENOUS

## 2017-03-16 MED ORDER — LIDOCAINE HCL (PF) 2 % IJ SOLN
INTRAMUSCULAR | Status: AC
  Filled 2017-03-16: qty 10

## 2017-03-16 MED ORDER — MEPERIDINE HCL 50 MG/ML IJ SOLN: 6.2500 mg | INTRAMUSCULAR | Status: DC | PRN

## 2017-03-16 MED ORDER — PROPOFOL 10 MG/ML IV BOLUS
INTRAVENOUS | Status: DC | PRN
  Administered 2017-03-16: 140 mg via INTRAVENOUS
  Administered 2017-03-16: 50 mg via INTRAVENOUS

## 2017-03-16 MED ORDER — SODIUM CHLORIDE 0.9 % IV SOLN
INTRAVENOUS | Status: DC
  Administered 2017-03-16: 11:00:00 via INTRAVENOUS

## 2017-03-16 MED ORDER — MIDAZOLAM HCL 2 MG/2ML IJ SOLN
INTRAMUSCULAR | Status: AC
  Filled 2017-03-16: qty 2

## 2017-03-16 MED ORDER — CEFAZOLIN SODIUM-DEXTROSE 2-4 GM/100ML-% IV SOLN
INTRAVENOUS | Status: AC
  Filled 2017-03-16: qty 100

## 2017-03-16 MED ORDER — BUPIVACAINE-EPINEPHRINE 0.5% -1:200000 IJ SOLN
INTRAMUSCULAR | Status: DC | PRN
  Administered 2017-03-16: 30 mL

## 2017-03-16 MED ORDER — ONDANSETRON HCL 4 MG/2ML IJ SOLN: 4.0000 mg | Freq: Four times a day (QID) | INTRAMUSCULAR | Status: DC | PRN

## 2017-03-16 MED ORDER — ONDANSETRON HCL 4 MG/2ML IJ SOLN
INTRAMUSCULAR | Status: DC | PRN
  Administered 2017-03-16: 4 mg via INTRAVENOUS

## 2017-03-16 MED ORDER — PHENYLEPHRINE HCL 10 MG/ML IJ SOLN
INTRAMUSCULAR | Status: DC | PRN
  Administered 2017-03-16: 50 ug via INTRAVENOUS

## 2017-03-16 MED ORDER — METOCLOPRAMIDE HCL 10 MG PO TABS: 5.0000 mg | ORAL_TABLET | Freq: Three times a day (TID) | ORAL | Status: DC | PRN

## 2017-03-16 MED ORDER — METOPROLOL SUCCINATE ER 25 MG PO TB24: 25.0000 mg | ORAL_TABLET | Freq: Every day | ORAL | 0 refills | Status: DC

## 2017-03-16 MED ORDER — CEFAZOLIN SODIUM-DEXTROSE 2-4 GM/100ML-% IV SOLN
2.0000 g | Freq: Once | INTRAVENOUS | Status: AC
  Administered 2017-03-16: 2 g via INTRAVENOUS

## 2017-03-16 MED ORDER — FENTANYL CITRATE (PF) 100 MCG/2ML IJ SOLN: 25.0000 ug | INTRAMUSCULAR | Status: DC | PRN

## 2017-03-16 MED ORDER — ROCURONIUM BROMIDE 50 MG/5ML IV SOLN
INTRAVENOUS | Status: AC
  Filled 2017-03-16: qty 2

## 2017-03-16 MED ORDER — OXYCODONE HCL 5 MG PO TABS: 5.0000 mg | ORAL_TABLET | Freq: Once | ORAL | Status: DC | PRN

## 2017-03-16 MED ORDER — BUPIVACAINE HCL (PF) 0.5 % IJ SOLN
INTRAMUSCULAR | Status: DC | PRN
  Administered 2017-03-16: 10 mL via PERINEURAL

## 2017-03-16 MED ORDER — BUPIVACAINE-EPINEPHRINE (PF) 0.5% -1:200000 IJ SOLN
INTRAMUSCULAR | Status: AC
  Filled 2017-03-16: qty 30

## 2017-03-16 MED ORDER — LIDOCAINE HCL (CARDIAC) 20 MG/ML IV SOLN
INTRAVENOUS | Status: DC | PRN
  Administered 2017-03-16: 100 mg via INTRAVENOUS

## 2017-03-16 MED ORDER — GABAPENTIN 600 MG PO TABS: 600.0000 mg | ORAL_TABLET | Freq: Two times a day (BID) | ORAL | 0 refills | Status: DC

## 2017-03-16 MED ORDER — BUPIVACAINE LIPOSOME 1.3 % IJ SUSP
INTRAMUSCULAR | Status: AC
  Filled 2017-03-16: qty 20

## 2017-03-16 MED ORDER — EPINEPHRINE PF 1 MG/ML IJ SOLN
INTRAMUSCULAR | Status: AC
  Filled 2017-03-16: qty 2

## 2017-03-16 MED ORDER — MIDAZOLAM HCL 2 MG/2ML IJ SOLN
1.0000 mg | Freq: Once | INTRAMUSCULAR | Status: AC
  Administered 2017-03-16: 1 mg via INTRAVENOUS

## 2017-03-16 MED ORDER — OXYCODONE HCL 5 MG/5ML PO SOLN: 5.0000 mg | Freq: Once | ORAL | Status: DC | PRN

## 2017-03-16 MED ORDER — PROMETHAZINE HCL 25 MG/ML IJ SOLN: 6.2500 mg | INTRAMUSCULAR | Status: DC | PRN

## 2017-03-16 MED ORDER — LIDOCAINE HCL (PF) 1 % IJ SOLN
INTRAMUSCULAR | Status: DC | PRN
  Administered 2017-03-16: 3 mL

## 2017-03-16 MED ORDER — BUPIVACAINE LIPOSOME 1.3 % IJ SUSP
INTRAMUSCULAR | Status: DC | PRN
  Administered 2017-03-16: 20 mL via PERINEURAL

## 2017-03-16 MED ORDER — EPHEDRINE SULFATE 50 MG/ML IJ SOLN
INTRAMUSCULAR | Status: AC
  Filled 2017-03-16: qty 1

## 2017-03-16 MED ORDER — ROCURONIUM BROMIDE 100 MG/10ML IV SOLN
INTRAVENOUS | Status: DC | PRN
  Administered 2017-03-16: 70 mg via INTRAVENOUS

## 2017-03-16 MED ORDER — ONDANSETRON HCL 4 MG/2ML IJ SOLN
INTRAMUSCULAR | Status: AC
  Filled 2017-03-16: qty 2

## 2017-03-16 MED ORDER — METOCLOPRAMIDE HCL 5 MG/ML IJ SOLN: 5.0000 mg | Freq: Three times a day (TID) | INTRAMUSCULAR | Status: DC | PRN

## 2017-03-16 MED ORDER — FENTANYL CITRATE (PF) 250 MCG/5ML IJ SOLN
INTRAMUSCULAR | Status: AC
  Filled 2017-03-16: qty 5

## 2017-03-16 MED ORDER — POTASSIUM CHLORIDE IN NACL 20-0.9 MEQ/L-% IV SOLN: INTRAVENOUS | Status: DC

## 2017-03-16 MED ORDER — BUPIVACAINE HCL (PF) 0.5 % IJ SOLN
INTRAMUSCULAR | Status: AC
  Filled 2017-03-16: qty 10

## 2017-03-16 MED ORDER — SUGAMMADEX SODIUM 200 MG/2ML IV SOLN
INTRAVENOUS | Status: AC
  Filled 2017-03-16: qty 2

## 2017-03-16 MED ORDER — FENTANYL CITRATE (PF) 100 MCG/2ML IJ SOLN
INTRAMUSCULAR | Status: DC | PRN
  Administered 2017-03-16: 25 ug via INTRAVENOUS
  Administered 2017-03-16 (×2): 50 ug via INTRAVENOUS

## 2017-03-16 MED ORDER — FENTANYL CITRATE (PF) 100 MCG/2ML IJ SOLN
50.0000 ug | Freq: Once | INTRAMUSCULAR | Status: AC
  Administered 2017-03-16: 50 ug via INTRAVENOUS

## 2017-03-16 MED ORDER — ONDANSETRON HCL 4 MG PO TABS: 4.0000 mg | ORAL_TABLET | Freq: Four times a day (QID) | ORAL | Status: DC | PRN

## 2017-03-16 SURGICAL SUPPLY — 44 items
ANCH SUT RGNRT REGENETEN (Staple) ×2 IMPLANT
ANCHOR TENDON REGENETEN (Staple) ×4 IMPLANT
ANCHORS BONE REGENETEN (Anchor) ×2 IMPLANT
BIT DRILL JUGRKNT W/NDL BIT2.9 (DRILL) IMPLANT
BLADE FULL RADIUS 3.5 (BLADE) ×3 IMPLANT
BUR ACROMIONIZER 4.0 (BURR) ×3 IMPLANT
CANNULA SHAVER 8MMX76MM (CANNULA) ×3 IMPLANT
CHLORAPREP W/TINT 26ML (MISCELLANEOUS) ×3 IMPLANT
COVER MAYO STAND STRL (DRAPES) ×3 IMPLANT
DRAPE IMP U-DRAPE 54X76 (DRAPES) ×6 IMPLANT
DRILL JUGGERKNOT W/NDL BIT 2.9 (DRILL)
ELECT REM PT RETURN 9FT ADLT (ELECTROSURGICAL) ×3
ELECTRODE REM PT RTRN 9FT ADLT (ELECTROSURGICAL) ×1 IMPLANT
GAUZE PETRO XEROFOAM 1X8 (MISCELLANEOUS) ×3 IMPLANT
GAUZE SPONGE 4X4 12PLY STRL (GAUZE/BANDAGES/DRESSINGS) ×3 IMPLANT
GLOVE BIO SURGEON STRL SZ7.5 (GLOVE) ×6 IMPLANT
GLOVE BIO SURGEON STRL SZ8 (GLOVE) ×6 IMPLANT
GLOVE BIOGEL PI IND STRL 8 (GLOVE) ×1 IMPLANT
GLOVE BIOGEL PI INDICATOR 8 (GLOVE) ×2
GLOVE INDICATOR 8.0 STRL GRN (GLOVE) ×3 IMPLANT
GOWN STRL REUS W/ TWL LRG LVL3 (GOWN DISPOSABLE) ×1 IMPLANT
GOWN STRL REUS W/ TWL XL LVL3 (GOWN DISPOSABLE) ×1 IMPLANT
GOWN STRL REUS W/TWL LRG LVL3 (GOWN DISPOSABLE) ×3
GOWN STRL REUS W/TWL XL LVL3 (GOWN DISPOSABLE) ×3
GRASPER SUT 15 45D LOW PRO (SUTURE) IMPLANT
IMPLANT REGENETEN MEDIUM (Shoulder) ×2 IMPLANT
IV LACTATED RINGER IRRG 3000ML (IV SOLUTION) ×6
IV LR IRRIG 3000ML ARTHROMATIC (IV SOLUTION) ×2 IMPLANT
MANIFOLD NEPTUNE II (INSTRUMENTS) ×3 IMPLANT
MASK FACE SPIDER DISP (MASK) ×3 IMPLANT
MAT BLUE FLOOR 46X72 FLO (MISCELLANEOUS) ×3 IMPLANT
PACK ARTHROSCOPY SHOULDER (MISCELLANEOUS) ×3 IMPLANT
SLING ARM LRG DEEP (SOFTGOODS) ×1 IMPLANT
SLING ULTRA II LG (MISCELLANEOUS) ×3 IMPLANT
STAPLER SKIN PROX 35W (STAPLE) ×3 IMPLANT
STRAP SAFETY 5IN WIDE (MISCELLANEOUS) ×3 IMPLANT
SUT ETHIBOND 0 MO6 C/R (SUTURE) ×3 IMPLANT
SUT VIC AB 2-0 CT1 27 (SUTURE) ×6
SUT VIC AB 2-0 CT1 TAPERPNT 27 (SUTURE) ×2 IMPLANT
TAPE MICROFOAM 4IN (TAPE) ×3 IMPLANT
TUBING ARTHRO INFLOW-ONLY STRL (TUBING) ×3 IMPLANT
TUBING CONNECTING 10 (TUBING) ×2 IMPLANT
TUBING CONNECTING 10' (TUBING) ×1
WAND HAND CNTRL MULTIVAC 90 (MISCELLANEOUS) ×3 IMPLANT

## 2017-03-16 NOTE — Anesthesia Post-op Follow-up Note (Signed)
Anesthesia QCDR form completed.        

## 2017-03-16 NOTE — OR Nursing (Signed)
Patient took inderal last night and metoprolol yesterday morning. HR 55 this am. Dr. Randa Lynn stated to hold metoprolol this am.

## 2017-03-16 NOTE — Discharge Instructions (Addendum)
Keep dressing dry and intact.  May shower after dressing changed on post-op day #4 (Saturday).  Cover staples with Band-Aids after drying off. Apply ice frequently to shoulder. Take Celebrex daily with meal for 7-10 days, then as necessary. Take pain medication as prescribed when needed.  May supplement with ES Tylenol if necessary. Keep shoulder immobilizer on at all times except may remove for bathing purposes. Follow-up in 10-14 days or as scheduled.  Interscalene Nerve Block, Care After This sheet gives you information about how to care for yourself after your procedure. Your health care provider may also give you more specific instructions. If you have problems or questions, contact your health care provider. What can I expect after the procedure? After the procedure, it is common to have:  Soreness or tenderness in your neck.  Numbness in your shoulder, upper arm, and some fingers.  Weakness in your shoulder and arm muscles.  The feeling and strength in your shoulder, arm, and fingers should return to normal within hours after your procedure. Follow these instructions at home: For at least 24 hours after the procedure:  Do not: ? Participate in activities in which you could fall or become injured. ? Drive. ? Use heavy machinery. ? Drink alcohol. ? Take sleeping pills or medicines that cause drowsiness. ? Make important decisions or sign legal documents. ? Take care of children on your own.  Rest. Eating and drinking  If you vomit, drink water, juice, or soup when you can drink without vomiting.  Make sure you have little or no nausea before eating solid foods.  Follow the diet that is recommended by your health care provider. If you have a sling:  Wear it as told by your health care provider. Remove it only as told by your health care provider.  Loosen the sling if your fingers tingle, become numb, or turn cold and blue.  Make sure that your entire arm, including  your wrist, is supported. Do not allow your wrist to dangle over the end of the sling.  Do not let your sling get wet if it is not waterproof.  Keep the sling clean. Bathing  Do not take baths, swim, or use a hot tub until your health care provider approves.  If you have a nerve block catheter in place, keep the incision site and tubing dry. Injection site care  Wash your hands with soap and water before you change your bandage (dressing). If soap and water are not available, use hand sanitizer.  Change your dressing as told by your health care provider.  Keep your dressing dry.  Check your nerve block injection site every day for signs of infection. Check for: ? Redness, swelling, or pain. ? Fluid or blood. ? Warmth. Activity  Do not perform complex or risky activities while taking prescription pain medicine and until you have fully recovered.  Return to your normal activities as told by your health care provider and as you can tolerate them. Ask your health care provider what activities are safe for you.  Rest and take it easy. This will help you heal and recover more quickly and fully.  Be very cautious until you have regained strength and sensation. General instructions  Have a responsible adult stay with you until you are awake and alert.  Do not drive or use heavy machinery while taking prescription pain medicine and until you have fully recovered. Ask your health care provider when it is safe to drive.  Take over-the-counter and  prescription medicines only as told by your health care provider.  If you smoke, do not smoke without supervision.  Do not expose your arm or shoulder to very cold or very hot temperatures until you have full feeling back.  If you have a nerve block catheter in place: ? Try to keep the catheter from getting kinked or pinched. ? Avoid pulling or tugging on the catheter.  Keep all follow-up visits as told by your health care provider. This  is important. Contact a health care provider if:  You have chills or fever.  You have redness, swelling, or pain around your injection site.  You have fluid or blood coming from the injection site.  The skin around the injection site is warm to the touch.  There is a bad smell coming from your dressing.  You have hoarseness or a drooping or dry eye that lasts more than a few days.  You have pain that is poorly controlled with the block or with pain medicine.  You have numbness, tingling, or weakness in your shoulder or arm that lasts for more than one week. Get help right away if:  You have severe pain.  You lose or do not regain strength and sensation in your arm even after the nerve block medicine has stopped.  You have trouble breathing.  You have a nerve block catheter still in place and you begin to shiver.  You have a nerve block catheter still in place and you are getting more and more numb or weak. This information is not intended to replace advice given to you by your health care provider. Make sure you discuss any questions you have with your health care provider. Document Released: 01/11/2015 Document Revised: 09/20/2015 Document Reviewed: 09/20/2015 Elsevier Interactive Patient Education  2018 [employer] Anesthesia, Adult, Care After These instructions provide you with information about caring for yourself after your procedure. Your health care provider may also give you more specific instructions. Your treatment has been planned according to current medical practices, but problems sometimes occur. Call your health care provider if you have any problems or questions after your procedure. What can I expect after the procedure? After the procedure, it is common to have:  Vomiting.  A sore throat.  Mental slowness.  It is common to feel:  Nauseous.  Cold or shivery.  Sleepy.  Tired.  Sore or achy, even in parts of your body where you did not  have surgery.  Follow these instructions at home: For at least 24 hours after the procedure:  Do not: ? Participate in activities where you could fall or become injured. ? Drive. ? Use heavy machinery. ? Drink alcohol. ? Take sleeping pills or medicines that cause drowsiness. ? Make important decisions or sign legal documents. ? Take care of children on your own.  Rest. Eating and drinking  If you vomit, drink water, juice, or soup when you can drink without vomiting.  Drink enough fluid to keep your urine clear or pale yellow.  Make sure you have little or no nausea before eating solid foods.  Follow the diet recommended by your health care provider. General instructions  Have a responsible adult stay with you until you are awake and alert.  Return to your normal activities as told by your health care provider. Ask your health care provider what activities are safe for you.  Take over-the-counter and prescription medicines only as told by your health care provider.  If you smoke,  do not smoke without supervision.  Keep all follow-up visits as told by your health care provider. This is important. Contact a health care provider if:  You continue to have nausea or vomiting at home, and medicines are not helpful.  You cannot drink fluids or start eating again.  You cannot urinate after 8-12 hours.  You develop a skin rash.  You have fever.  You have increasing redness at the site of your procedure. Get help right away if:  You have difficulty breathing.  You have chest pain.  You have unexpected bleeding.  You feel that you are having a life-threatening or urgent problem. This information is not intended to replace advice given to you by your health care provider. Make sure you discuss any questions you have with your health care provider. Document Released: 04/27/2000 Document Revised: 06/24/2015 Document Reviewed: 01/03/2015 Elsevier Interactive Patient  Education  [employer].

## 2017-03-16 NOTE — H&P (Signed)
Paper H&P to be scanned into permanent record. H&P reviewed and patient re-examined. No changes. 

## 2017-03-16 NOTE — Progress Notes (Signed)
Patient able to move fingers wnl, color pink, warm to touch.  Sprite given to patient per request.

## 2017-03-16 NOTE — Anesthesia Procedure Notes (Signed)
Anesthesia Regional Block: Interscalene brachial plexus block   Pre-Anesthetic Checklist: ,, timeout performed, Correct Patient, Correct Site, Correct Laterality, Correct Procedure, Correct Position, site marked, Risks and benefits discussed,  Surgical consent,  Pre-op evaluation,  At surgeon's request and post-op pain management  Laterality: Right  Prep: chloraprep       Needles:  Injection technique: Single-shot  Needle Type: Stimiplex     Needle Length: 10cm  Needle Gauge: 20     Additional Needles:   Procedures:,,,, ultrasound used (permanent image in chart),,,,  Narrative:  Start time: 03/16/2017 12:05 PM End time: 03/16/2017 12:10 PM Injection made incrementally with aspirations every 5 mL.  Performed by: Personally  Anesthesiologist: Emmie Niemann, MD  Additional Notes: Functioning IV was confirmed and monitors were applied.  A Stimuplex needle was used. Sterile prep and drape,hand hygiene and sterile gloves were used.  Negative aspiration and negative test dose prior to incremental administration of local anesthetic. The patient tolerated the procedure well.

## 2017-03-16 NOTE — Anesthesia Procedure Notes (Signed)
Procedure Name: Intubation Date/Time: 03/16/2017 12:56 PM Performed by: Bernardo Heater, CRNA Pre-anesthesia Checklist: Patient identified, Emergency Drugs available, Suction available and Patient being monitored Patient Re-evaluated:Patient Re-evaluated prior to induction Oxygen Delivery Method: Circle system utilized Preoxygenation: Pre-oxygenation with 100% oxygen Induction Type: IV induction Laryngoscope Size: Mac and 3 Grade View: Grade I Tube size: 7.0 mm Number of attempts: 1 Placement Confirmation: ETT inserted through vocal cords under direct vision,  positive ETCO2 and breath sounds checked- equal and bilateral Secured at: 21 cm Tube secured with: Tape Dental Injury: Teeth and Oropharynx as per pre-operative assessment

## 2017-03-16 NOTE — Anesthesia Preprocedure Evaluation (Addendum)
Anesthesia Evaluation  Patient identified by MRN, date of birth, ID band Patient awake    Reviewed: Allergy & Precautions, NPO status , Patient's Chart, lab work & pertinent test results  History of Anesthesia Complications Negative for: history of anesthetic complications  Airway Mallampati: III  TM Distance: >3 FB Neck ROM: Full    Dental no notable dental hx.    Pulmonary neg sleep apnea, neg COPD, former smoker,    breath sounds clear to auscultation- rhonchi (-) wheezing      Cardiovascular hypertension, Pt. on medications + CAD, + Past MI, + Cardiac Stents and +CHF (diastolic)   Rhythm:Regular Rate:Normal - Systolic murmurs and - Diastolic murmurs Echo 6/38/45: - Left ventricle: The cavity size was normal. There was mild   concentric hypertrophy. Systolic function was normal. The   estimated ejection fraction was in the range of 55% to 60%. Wall   motion was normal; there were no regional wall motion   abnormalities. Left ventricular diastolic function parameters   were normal.  L heart cath 06/04/15:  Prox LAD lesion, 30% stenosed.  Dist LAD lesion, 50% stenosed.  Prox Cx lesion, 40% stenosed.  Prox LAD to Mid LAD lesion, 5% stenosed. The lesion was previously treated with a drug-eluting stent six to twelve months ago.  The left ventricular systolic function is normal.  1st Diag lesion, 80% stenosed. Post intervention, there is a 0% residual stenosis.   1. Significant one-vessel coronary artery disease with widely patent LAD stent. Significant stenosis affecting the first diagonal. 2. Normal LV systolic function and normal LVEDP. 3. Successful angioplasty and drug-eluting stent placement to the first diagonal   Neuro/Psych  Headaches, PSYCHIATRIC DISORDERS Anxiety Depression Bipolar Disorder CVA (R sided weakness), Residual Symptoms    GI/Hepatic Neg liver ROS, PUD, GERD  ,  Endo/Other  diabetes, Oral  Hypoglycemic AgentsHypothyroidism   Renal/GU Renal InsufficiencyRenal disease     Musculoskeletal  (+) Arthritis ,   Abdominal (+) + obese,   Peds  Hematology  (+) anemia ,   Anesthesia Other Findings Past Medical History: No date: Anemia     Comment:  iron deficiency anemia No date: Anxiety No date: Aortic arch aneurysm (HCC) No date: Arthritis No date: Bipolar disorder ([place]) 2014: BRCA negative No date: CAD (coronary artery disease)     Comment:  a. cath 08/2003 nonobs dzs;  b. 11/2014: mLAD 85-90% s/p               PCI/DES (Xience 3.25 x 23); c. cath 04/2015: 70% mD1 @               bifurcation. Patent LAD stent - > Med Rx.; d. Cath-PCI               5/17: Patent LAD stent, D1 now ~80% - PCI Resolute DES               2.25 x 12.   No date: Chronic systolic CHF (congestive heart failure) ([place])     Comment:  a. echo 01/29/2013 EF 40-45%, basal anteroseptal & mid               anteroseptal segments abnl; b echo 05/2012 EF 50-55%,               mildly dilated LA, nl RVSP; c. 10/2015 Echo: EF 55-60%,               mild conc LVH, no rwma. No date: CKD (chronic kidney disease), stage  IV ([place]) No date: Colon polyp No date: CVA (cerebral vascular accident) ([place])     Comment:  a. 06/2008-TEE nl LV fxn; b. 01/2013 - notes indicate               short run of SVT at that time No date: Depression No date: Diabetes mellitus without complication ([place]) No date: Family history of breast cancer     Comment:  BRCA neg 2014 04/27/2011: Gastric ulcer No date: GERD (gastroesophageal reflux disease) No date: Headache     Comment:  Migraines No date: Hyperlipidemia No date: Hypertension No date: Hypertensive heart disease No date: Hypothyroidism No date: Ischemic cardiomyopathy     Comment:  a. EF prev 40-45% in 2014-->Improved to 55-60% (10/2015). No date: Malignant melanoma of skin of scalp (HCC) No date: MI, acute, non ST segment elevation (HCC) No date: Morbid obesity ([place])      Comment:  a. s/p gastric bypass. 2015: Multiple sclerosis ([place]) 06/03/2015: Myocardial infarction ([place]) No date: Neuromuscular disorder ([place]) No date: Neuropathy No date: Orthostatic hypotension 06/04/2015: S/P drug eluting coronary stent placement No date: Syncope   Reproductive/Obstetrics                             Anesthesia Physical Anesthesia Plan  ASA: III  Anesthesia Plan: General   Post-op Pain Management:  Regional for Post-op pain   Induction: Intravenous  PONV Risk Score and Plan: 2 and Dexamethasone and Ondansetron  Airway Management Planned: Oral ETT  Additional Equipment:   Intra-op Plan:   Post-operative Plan: Extubation in OR  Informed Consent: I have reviewed the patients History and Physical, chart, labs and discussed the procedure including the risks, benefits and alternatives for the proposed anesthesia with the patient or authorized representative who has indicated his/her understanding and acceptance.   Dental advisory given  Plan Discussed with: CRNA and Anesthesiologist  Anesthesia Plan Comments:         Anesthesia Quick Evaluation

## 2017-03-16 NOTE — Transfer of Care (Signed)
Immediate Anesthesia Transfer of Care Note  Patient: [name]  Procedure(s) Performed: SHOULDER ARTHROSCOPY WITH OPEN ROTATOR CUFF REPAIR POSSIBLE BICEPS TENODESIS (Right Shoulder)  Patient Location: PACU  Anesthesia Type:General  Level of Consciousness: awake, alert , oriented and patient cooperative  Airway & Oxygen Therapy: Patient Spontanous Breathing and Patient connected to nasal cannula oxygen  Post-op Assessment: Report given to RN and Post -op Vital signs reviewed and stable  Post vital signs: Reviewed and stable  Last Vitals:  Vitals:   03/16/17 1213 03/16/17 1228  BP: (!) 170/73 (!) 157/73  Pulse: (!) 56 (!) 53  Resp: 15 (!) 22  Temp:    SpO2: 99% 98%    Last Pain:  Vitals:   03/16/17 0953  TempSrc: Oral  PainSc: 6          Complications: No apparent anesthesia complications

## 2017-03-16 NOTE — Anesthesia Postprocedure Evaluation (Signed)
Anesthesia Post Note  Patient: [name]  Procedure(s) Performed: SHOULDER ARTHROSCOPY WITH OPEN ROTATOR CUFF REPAIR POSSIBLE BICEPS TENODESIS (Right Shoulder)  Patient location during evaluation: PACU Anesthesia Type: General Level of consciousness: awake and alert and oriented Pain management: pain level controlled Vital Signs Assessment: post-procedure vital signs reviewed and stable Respiratory status: spontaneous breathing, nonlabored ventilation and respiratory function stable Cardiovascular status: blood pressure returned to baseline and stable Postop Assessment: no signs of nausea or vomiting Anesthetic complications: no     Last Vitals:  Vitals:   03/16/17 1515 03/16/17 1535  BP: (!) 149/69 (!) 166/79  Pulse: (!) 59 60  Resp: 16 14  Temp:  (!) 36.3 C  SpO2: 96% 100%    Last Pain:  Vitals:   03/16/17 1535  TempSrc: Temporal  PainSc: 0-No pain                 Margit Batte

## 2017-03-16 NOTE — Op Note (Signed)
03/16/2017  2:48 PM  Patient:   [name]  Pre-Op Diagnosis:   Impingement/tendinopathy with partial-thickness rotator cuff tear, right shoulder.  Post-Op Diagnosis: Impingement/tendinopathy with partial-thickness rotator cuff tear and biceps tendinopathy, right shoulder.  Procedure: Limited arthroscopic debridement, arthroscopic subacromial decompression, mini-open rotator cuff repair, and mini-open biceps tenodesis, right shoulder.  Anesthesia: General endotracheal with interscalene block placed preoperatively by the anesthesiologist.  Surgeon:   Pascal Lux, MD  Assistant:   Clearnce Sorrel PA-S  Findings: As above.  There was some fraying of the labrum superiorly and posteriorly without frank detachment of the labrum from the glenoid.  There were moderate inflammatory changes of the intra-articular portion of the long head of the biceps tendon.  The articular surfaces of the glenoid and humerus both were in excellent condition.  The rotator cuff demonstrated an area of bursal surface partial thickness tearing involving the anterior insertional fibers of the supraspinatus tendon.  Complications: None  Fluids:   850 cc  Estimated blood loss: 5 cc  Tourniquet time: None  Drains: None  Closure: Staples   Brief clinical note: The patient is a 47 year old female who is now 15 months status post a right shoulder arthroscopy with debridement and subacromial decompression. The patient was doing well until she sustained a series of falls over the past few months, resulting in increased right shoulder pain. The patient's symptoms have progressed despite medications, activity modification, etc. The patient's history and examination are consistent with impingement/tendinopathy with a possible rotator cuff tear.  A recent MRI scan suggested a partial-thickness rotator cuff tear involving the anterior insertional fibers of the supraspinatus tendon. The patient  presents at this time for definitive management of these shoulder symptoms.  Procedure: The patient underwent placement of an interscalene block by the anesthesiologist in the preoperative holding area before being brought into the operating room and lain in the supine position. The patient then underwent general endotracheal intubation and anesthesia before being repositioned in the beach chair position using the beach chair positioner. The right shoulder and upper extremity were prepped with ChloraPrep solution before being draped sterilely. Preoperative antibiotics were administered. A timeout was performed to confirm the proper surgical site before the expected portal sites and incision site were injected with 0.5% Sensorcaine with epinephrine. A posterior portal was created and the glenohumeral joint thoroughly inspected with the findings as described above. An anterior portal was created using an outside-in technique. The labrum and rotator cuff were further probed, again confirming the above-noted findings. Areas of labral fraying were debrided back to stable margins using the full-radius resector. The ArthroCare wand was inserted and used to release the biceps tendon from its labral anchor.  However, the biceps tendon was scarred and to the adjacent tissues within the tendon sheath and to the lateral portion of the rotator interval, so it did not retract fully. The ArthroCare wand also was used to obtain hemostasis as well as to "anneal" the labrum superiorly and anteriorly. The instruments were removed from the joint after suctioning the excess fluid.  The camera was repositioned through the posterior portal into the subacromial space. A separate lateral portal was created using an outside-in technique. The 3.5 mm full-radius resector was introduced and used to perform a subtotal bursectomy. The ArthroCare wand was then inserted and used to remove the periosteal tissue off the undersurface of the  anterior third of the acromion as well as to recess the coracoacromial ligament from its attachment along the anterior and  lateral margins of the acromion. The 4.0 mm acromionizing bur was introduced and used to complete the decompression by removing the undersurface of the anterior third of the acromion. The full radius resector was reintroduced to remove any residual bony debris before the ArthroCare wand was reintroduced to obtain hemostasis. The instruments were then removed from the subacromial space after suctioning the excess fluid.  Utilizing the previous incision, an approximately 4-5 cm incision was made over the anterolateral aspect of the shoulder beginning at the anterolateral corner of the acromion and extending distally in line with the bicipital groove. This incision was carried down through the subcutaneous tissues to expose the deltoid fascia. The raphae between the anterior and middle thirds was identified and this plane developed to provide access into the subacromial space. Additional bursal tissues were debrided sharply using Metzenbaum scissors. The rotator cuff was carefully inspected.  There was an area of degenerative fraying of the bursal surface of the supraspinatus tendon measuring approximately 1.5 x 1.5 cm with a small area of more significant degenerative changes. This latter area was debrided sharply with a #15 blade before a side-to-side repair was performed using two #0 Ethibond interrupted sutures.  A medium sized [place] patch was then applied over this area of tendinopathic tissue and secured using 7 soft tissue staples and 3 bone staples.  The bicipital groove was identified by palpation and opened for 1-1.5 cm. An attempt was made to retrieve the biceps tendon stump through this defect, but the tendon was too scarred in. Therefore, the tendon itself was tenodesed to the surrounding soft tissues using two #0 Ethibond interrupted sutures.  The wound was  copiously irrigated with sterile saline solution before the deltoid raphae was reapproximated using 2-0 Vicryl interrupted sutures. The subcutaneous tissues were closed in two layers using 2-0 Vicryl interrupted sutures before the skin was closed using staples. The portal sites also were closed using staples. A sterile bulky dressing was applied to the shoulder before the arm was placed into a shoulder immobilizer. The patient was then awakened, extubated, and returned to the recovery room in satisfactory condition after tolerating the procedure well.

## 2017-03-17 ENCOUNTER — Encounter: Payer: Self-pay | Admitting: Surgery

## 2017-03-18 ENCOUNTER — Other Ambulatory Visit: Payer: Self-pay

## 2017-03-18 ENCOUNTER — Encounter: Payer: Self-pay | Admitting: Family Medicine

## 2017-03-18 ENCOUNTER — Ambulatory Visit (INDEPENDENT_AMBULATORY_CARE_PROVIDER_SITE_OTHER): Payer: Medicare Other | Admitting: Family Medicine

## 2017-03-18 ENCOUNTER — Emergency Department: Payer: Medicare Other

## 2017-03-18 ENCOUNTER — Encounter: Payer: Self-pay | Admitting: Emergency Medicine

## 2017-03-18 ENCOUNTER — Inpatient Hospital Stay
Admission: EM | Admit: 2017-03-18 | Discharge: 2017-03-22 | DRG: 853 | Disposition: A | Discharge status: Home / Self Care | Payer: Medicare Other | Attending: Internal Medicine | Admitting: Internal Medicine

## 2017-03-18 VITALS — BP 100/60 | HR 74 | Temp 97.7°F | Resp 16 | Ht 62.0 in | Wt 164.0 lb

## 2017-03-18 DIAGNOSIS — J189 Pneumonia, unspecified organism: Secondary | ICD-10-CM

## 2017-03-18 DIAGNOSIS — G8929 Other chronic pain: Secondary | ICD-10-CM | POA: Diagnosis present

## 2017-03-18 DIAGNOSIS — E039 Hypothyroidism, unspecified: Secondary | ICD-10-CM | POA: Diagnosis present

## 2017-03-18 DIAGNOSIS — I10 Essential (primary) hypertension: Secondary | ICD-10-CM

## 2017-03-18 DIAGNOSIS — N12 Tubulo-interstitial nephritis, not specified as acute or chronic: Secondary | ICD-10-CM | POA: Diagnosis not present

## 2017-03-18 DIAGNOSIS — E118 Type 2 diabetes mellitus with unspecified complications: Secondary | ICD-10-CM | POA: Diagnosis not present

## 2017-03-18 DIAGNOSIS — G92 Toxic encephalopathy: Secondary | ICD-10-CM | POA: Diagnosis present

## 2017-03-18 DIAGNOSIS — D509 Iron deficiency anemia, unspecified: Secondary | ICD-10-CM | POA: Diagnosis present

## 2017-03-18 DIAGNOSIS — N179 Acute kidney failure, unspecified: Secondary | ICD-10-CM

## 2017-03-18 DIAGNOSIS — F419 Anxiety disorder, unspecified: Secondary | ICD-10-CM | POA: Insufficient documentation

## 2017-03-18 DIAGNOSIS — E860 Dehydration: Secondary | ICD-10-CM | POA: Diagnosis present

## 2017-03-18 DIAGNOSIS — R2681 Unsteadiness on feet: Secondary | ICD-10-CM | POA: Diagnosis not present

## 2017-03-18 DIAGNOSIS — I959 Hypotension, unspecified: Secondary | ICD-10-CM | POA: Diagnosis not present

## 2017-03-18 DIAGNOSIS — A419 Sepsis, unspecified organism: Secondary | ICD-10-CM | POA: Diagnosis present

## 2017-03-18 DIAGNOSIS — E7800 Pure hypercholesterolemia, unspecified: Secondary | ICD-10-CM

## 2017-03-18 DIAGNOSIS — R531 Weakness: Secondary | ICD-10-CM | POA: Diagnosis not present

## 2017-03-18 DIAGNOSIS — M1990 Unspecified osteoarthritis, unspecified site: Secondary | ICD-10-CM | POA: Insufficient documentation

## 2017-03-18 DIAGNOSIS — F319 Bipolar disorder, unspecified: Secondary | ICD-10-CM | POA: Diagnosis present

## 2017-03-18 DIAGNOSIS — F329 Major depressive disorder, single episode, unspecified: Secondary | ICD-10-CM | POA: Diagnosis not present

## 2017-03-18 DIAGNOSIS — G4700 Insomnia, unspecified: Secondary | ICD-10-CM | POA: Diagnosis not present

## 2017-03-18 DIAGNOSIS — G43909 Migraine, unspecified, not intractable, without status migrainosus: Secondary | ICD-10-CM | POA: Diagnosis present

## 2017-03-18 DIAGNOSIS — I2510 Atherosclerotic heart disease of native coronary artery without angina pectoris: Secondary | ICD-10-CM | POA: Diagnosis present

## 2017-03-18 DIAGNOSIS — N200 Calculus of kidney: Secondary | ICD-10-CM | POA: Diagnosis not present

## 2017-03-18 DIAGNOSIS — R4701 Aphasia: Secondary | ICD-10-CM | POA: Diagnosis present

## 2017-03-18 DIAGNOSIS — I119 Hypertensive heart disease without heart failure: Secondary | ICD-10-CM | POA: Diagnosis not present

## 2017-03-18 DIAGNOSIS — R4781 Slurred speech: Secondary | ICD-10-CM | POA: Diagnosis not present

## 2017-03-18 DIAGNOSIS — N183 Chronic kidney disease, stage 3 (moderate): Secondary | ICD-10-CM | POA: Diagnosis not present

## 2017-03-18 DIAGNOSIS — R6521 Severe sepsis with septic shock: Secondary | ICD-10-CM | POA: Diagnosis present

## 2017-03-18 DIAGNOSIS — Z853 Personal history of malignant neoplasm of breast: Secondary | ICD-10-CM | POA: Diagnosis not present

## 2017-03-18 DIAGNOSIS — J181 Lobar pneumonia, unspecified organism: Secondary | ICD-10-CM | POA: Diagnosis present

## 2017-03-18 DIAGNOSIS — I130 Hypertensive heart and chronic kidney disease with heart failure and stage 1 through stage 4 chronic kidney disease, or unspecified chronic kidney disease: Secondary | ICD-10-CM | POA: Diagnosis present

## 2017-03-18 DIAGNOSIS — R4182 Altered mental status, unspecified: Secondary | ICD-10-CM | POA: Diagnosis present

## 2017-03-18 DIAGNOSIS — I69354 Hemiplegia and hemiparesis following cerebral infarction affecting left non-dominant side: Secondary | ICD-10-CM | POA: Diagnosis not present

## 2017-03-18 DIAGNOSIS — F5102 Adjustment insomnia: Secondary | ICD-10-CM

## 2017-03-18 DIAGNOSIS — N289 Disorder of kidney and ureter, unspecified: Secondary | ICD-10-CM

## 2017-03-18 DIAGNOSIS — F32A Depression, unspecified: Secondary | ICD-10-CM

## 2017-03-18 DIAGNOSIS — F439 Reaction to severe stress, unspecified: Secondary | ICD-10-CM

## 2017-03-18 DIAGNOSIS — N390 Urinary tract infection, site not specified: Secondary | ICD-10-CM | POA: Diagnosis present

## 2017-03-18 DIAGNOSIS — E11319 Type 2 diabetes mellitus with unspecified diabetic retinopathy without macular edema: Secondary | ICD-10-CM | POA: Diagnosis present

## 2017-03-18 DIAGNOSIS — G35 Multiple sclerosis: Secondary | ICD-10-CM | POA: Diagnosis present

## 2017-03-18 DIAGNOSIS — E872 Acidosis: Secondary | ICD-10-CM | POA: Diagnosis present

## 2017-03-18 DIAGNOSIS — R4020 Unspecified coma: Secondary | ICD-10-CM | POA: Diagnosis not present

## 2017-03-18 DIAGNOSIS — I255 Ischemic cardiomyopathy: Secondary | ICD-10-CM | POA: Insufficient documentation

## 2017-03-18 DIAGNOSIS — E1122 Type 2 diabetes mellitus with diabetic chronic kidney disease: Secondary | ICD-10-CM | POA: Diagnosis present

## 2017-03-18 DIAGNOSIS — J9601 Acute respiratory failure with hypoxia: Secondary | ICD-10-CM | POA: Diagnosis present

## 2017-03-18 DIAGNOSIS — I25111 Atherosclerotic heart disease of native coronary artery with angina pectoris with documented spasm: Secondary | ICD-10-CM | POA: Diagnosis not present

## 2017-03-18 DIAGNOSIS — E875 Hyperkalemia: Secondary | ICD-10-CM | POA: Diagnosis not present

## 2017-03-18 DIAGNOSIS — I252 Old myocardial infarction: Secondary | ICD-10-CM | POA: Diagnosis not present

## 2017-03-18 DIAGNOSIS — I5032 Chronic diastolic (congestive) heart failure: Secondary | ICD-10-CM | POA: Diagnosis present

## 2017-03-18 HISTORY — DX: Sepsis, unspecified organism: A41.9

## 2017-03-18 LAB — POCT GLYCOSYLATED HEMOGLOBIN (HGB A1C)
Est. average glucose Bld gHb Est-mCnc: 117
HEMOGLOBIN A1C: 5.7

## 2017-03-18 LAB — BASIC METABOLIC PANEL
ANION GAP: 14 (ref 5–15)
BUN: 33 mg/dL — ABNORMAL HIGH (ref 6–20)
CHLORIDE: 101 mmol/L (ref 101–111)
CO2: 19 mmol/L — ABNORMAL LOW (ref 22–32)
Calcium: 8.8 mg/dL — ABNORMAL LOW (ref 8.9–10.3)
Creatinine, Ser: 4.13 mg/dL — ABNORMAL HIGH (ref 0.44–1.00)
GFR calc Af Amer: 14 mL/min — ABNORMAL LOW (ref >60)
GFR, EST NON AFRICAN AMERICAN: 12 mL/min — AB (ref >60)
GLUCOSE: 191 mg/dL — AB (ref 65–99)
POTASSIUM: 4.9 mmol/L (ref 3.5–5.1)
Sodium: 134 mmol/L — ABNORMAL LOW (ref 135–145)

## 2017-03-18 LAB — GLUCOSE, CAPILLARY: Glucose-Capillary: 192 mg/dL — ABNORMAL HIGH (ref 65–99)

## 2017-03-18 LAB — CBC
HCT: 39.9 % (ref 35.0–47.0)
HEMOGLOBIN: 13 g/dL (ref 12.0–16.0)
MCH: 27.4 pg (ref 26.0–34.0)
MCHC: 32.6 g/dL (ref 32.0–36.0)
MCV: 84.2 fL (ref 80.0–100.0)
PLATELETS: 254 10*3/uL (ref 150–440)
RBC: 4.74 MIL/uL (ref 3.80–5.20)
RDW: 13.6 % (ref 11.5–14.5)
WBC: 21.7 10*3/uL — AB (ref 3.6–11.0)

## 2017-03-18 LAB — URINALYSIS, COMPLETE (UACMP) WITH MICROSCOPIC
BILIRUBIN URINE: NEGATIVE
Glucose, UA: NEGATIVE mg/dL
Ketones, ur: NEGATIVE mg/dL
Nitrite: NEGATIVE
PH: 5 (ref 5.0–8.0)
Protein, ur: 30 mg/dL — AB
SPECIFIC GRAVITY, URINE: 1.014 (ref 1.005–1.030)

## 2017-03-18 LAB — LACTIC ACID, PLASMA: Lactic Acid, Venous: 2.3 mmol/L (ref 0.5–1.9)

## 2017-03-18 MED ORDER — HYOSCYAMINE SULFATE 0.125 MG SL SUBL
0.1250 mg | SUBLINGUAL_TABLET | Freq: Four times a day (QID) | SUBLINGUAL | Status: DC | PRN
  Filled 2017-03-18: qty 1

## 2017-03-18 MED ORDER — METOPROLOL SUCCINATE ER 50 MG PO TB24: 25.0000 mg | ORAL_TABLET | Freq: Every day | ORAL | Status: DC

## 2017-03-18 MED ORDER — SODIUM CHLORIDE 0.9 % IV SOLN: 1.0000 g | INTRAVENOUS | Status: DC

## 2017-03-18 MED ORDER — CEFTRIAXONE SODIUM 2 G IJ SOLR
2.0000 g | INTRAMUSCULAR | Status: DC
  Filled 2017-03-18: qty 20

## 2017-03-18 MED ORDER — HEPARIN SODIUM (PORCINE) 5000 UNIT/ML IJ SOLN
5000.0000 [IU] | Freq: Three times a day (TID) | INTRAMUSCULAR | Status: DC
  Administered 2017-03-19 – 2017-03-22 (×11): 5000 [IU] via SUBCUTANEOUS
  Filled 2017-03-18 (×11): qty 1

## 2017-03-18 MED ORDER — ONETOUCH ULTRA 2 W/DEVICE KIT: PACK | 0 refills | Status: DC

## 2017-03-18 MED ORDER — BUPROPION HCL ER (XL) 150 MG PO TB24: 150.0000 mg | ORAL_TABLET | Freq: Every day | ORAL | 1 refills | Status: DC

## 2017-03-18 MED ORDER — TIZANIDINE HCL 4 MG PO TABS
4.0000 mg | ORAL_TABLET | Freq: Two times a day (BID) | ORAL | Status: DC | PRN
  Filled 2017-03-18: qty 1

## 2017-03-18 MED ORDER — DAKINS (1/4 STRENGTH) 0.125 % EX SOLN: Freq: Once | CUTANEOUS | Status: DC

## 2017-03-18 MED ORDER — INSULIN ASPART 100 UNIT/ML ~~LOC~~ SOLN: 0.0000 [IU] | Freq: Every day | SUBCUTANEOUS | Status: DC

## 2017-03-18 MED ORDER — PANTOPRAZOLE SODIUM 40 MG PO TBEC
40.0000 mg | DELAYED_RELEASE_TABLET | Freq: Two times a day (BID) | ORAL | Status: DC
  Administered 2017-03-19 – 2017-03-22 (×6): 40 mg via ORAL
  Filled 2017-03-18 (×6): qty 1

## 2017-03-18 MED ORDER — SODIUM CHLORIDE 0.9 % IV SOLN
1.0000 g | Freq: Once | INTRAVENOUS | Status: AC
  Administered 2017-03-18: 1 g via INTRAVENOUS
  Filled 2017-03-18: qty 10

## 2017-03-18 MED ORDER — ASPIRIN EC 81 MG PO TBEC
81.0000 mg | DELAYED_RELEASE_TABLET | Freq: Every day | ORAL | Status: DC
  Administered 2017-03-19 – 2017-03-22 (×3): 81 mg via ORAL
  Filled 2017-03-18 (×3): qty 1

## 2017-03-18 MED ORDER — AZITHROMYCIN 500 MG PO TABS: 250.0000 mg | ORAL_TABLET | Freq: Every day | ORAL | Status: DC

## 2017-03-18 MED ORDER — ZOLPIDEM TARTRATE 5 MG PO TABS
5.0000 mg | ORAL_TABLET | Freq: Every evening | ORAL | Status: DC | PRN
  Administered 2017-03-19: 10 mg via ORAL
  Filled 2017-03-18: qty 2

## 2017-03-18 MED ORDER — NITROGLYCERIN 0.4 MG SL SUBL: 0.4000 mg | SUBLINGUAL_TABLET | SUBLINGUAL | Status: DC | PRN

## 2017-03-18 MED ORDER — NALOXONE HCL 2 MG/2ML IJ SOSY
0.4000 mg | PREFILLED_SYRINGE | Freq: Once | INTRAMUSCULAR | Status: AC
  Administered 2017-03-18: 0.4 mg via INTRAVENOUS
  Filled 2017-03-18: qty 2

## 2017-03-18 MED ORDER — DILTIAZEM HCL ER COATED BEADS 120 MG PO CP24: 120.0000 mg | ORAL_CAPSULE | Freq: Every day | ORAL | Status: DC

## 2017-03-18 MED ORDER — PROPRANOLOL HCL ER 60 MG PO CP24
60.0000 mg | ORAL_CAPSULE | Freq: Every day | ORAL | Status: DC
Start: 2017-03-19 — End: 2017-03-19

## 2017-03-18 MED ORDER — SODIUM CHLORIDE 0.9 % IV BOLUS (SEPSIS)
1000.0000 mL | Freq: Once | INTRAVENOUS | Status: AC
  Administered 2017-03-18: 1000 mL via INTRAVENOUS

## 2017-03-18 MED ORDER — ACETAMINOPHEN 325 MG PO TABS
650.0000 mg | ORAL_TABLET | Freq: Four times a day (QID) | ORAL | Status: DC | PRN
Start: 2017-03-18 — End: 2017-03-22
  Administered 2017-03-21: 650 mg via ORAL
  Filled 2017-03-18: qty 2

## 2017-03-18 MED ORDER — HYDROCORTISONE NA SUCCINATE PF 100 MG IJ SOLR
50.0000 mg | Freq: Four times a day (QID) | INTRAMUSCULAR | Status: DC
  Administered 2017-03-18 – 2017-03-20 (×6): 50 mg via INTRAVENOUS
  Filled 2017-03-18 (×4): qty 1
  Filled 2017-03-18 (×2): qty 2
  Filled 2017-03-18: qty 1
  Filled 2017-03-18: qty 2

## 2017-03-18 MED ORDER — INSULIN ASPART 100 UNIT/ML ~~LOC~~ SOLN
0.0000 [IU] | Freq: Three times a day (TID) | SUBCUTANEOUS | Status: DC
  Administered 2017-03-19: 3 [IU] via SUBCUTANEOUS
  Administered 2017-03-19 – 2017-03-20 (×2): 2 [IU] via SUBCUTANEOUS
  Administered 2017-03-21 (×2): 1 [IU] via SUBCUTANEOUS
  Administered 2017-03-22 (×2): 2 [IU] via SUBCUTANEOUS
  Filled 2017-03-18 (×7): qty 1

## 2017-03-18 MED ORDER — GABAPENTIN 600 MG PO TABS
300.0000 mg | ORAL_TABLET | Freq: Two times a day (BID) | ORAL | Status: DC
  Administered 2017-03-19 – 2017-03-22 (×5): 300 mg via ORAL
  Filled 2017-03-18 (×9): qty 0.5

## 2017-03-18 MED ORDER — ALPRAZOLAM 0.5 MG PO TABS
0.5000 mg | ORAL_TABLET | Freq: Four times a day (QID) | ORAL | Status: DC | PRN
  Administered 2017-03-19: 1 mg via ORAL
  Filled 2017-03-18: qty 2

## 2017-03-18 MED ORDER — MORPHINE SULFATE (PF) 4 MG/ML IV SOLN
4.0000 mg | Freq: Once | INTRAVENOUS | Status: AC
  Administered 2017-03-18: 4 mg via INTRAVENOUS
  Filled 2017-03-18: qty 1

## 2017-03-18 MED ORDER — SODIUM CHLORIDE 0.9 % IV SOLN: 1.0000 g | Freq: Once | INTRAVENOUS | Status: DC

## 2017-03-18 MED ORDER — EZETIMIBE 10 MG PO TABS
10.0000 mg | ORAL_TABLET | Freq: Every day | ORAL | Status: DC
  Administered 2017-03-19 – 2017-03-21 (×4): 10 mg via ORAL
  Filled 2017-03-18 (×5): qty 1

## 2017-03-18 MED ORDER — SODIUM CHLORIDE 0.9 % IV SOLN
INTRAVENOUS | Status: DC
  Administered 2017-03-19: via INTRAVENOUS

## 2017-03-18 MED ORDER — BUPROPION HCL ER (XL) 150 MG PO TB24
150.0000 mg | ORAL_TABLET | Freq: Every day | ORAL | Status: DC
  Administered 2017-03-21 – 2017-03-22 (×2): 150 mg via ORAL
  Filled 2017-03-18 (×3): qty 1

## 2017-03-18 MED ORDER — ONDANSETRON HCL 4 MG PO TABS: 4.0000 mg | ORAL_TABLET | Freq: Four times a day (QID) | ORAL | Status: DC | PRN

## 2017-03-18 MED ORDER — SODIUM CHLORIDE 0.9 % IV SOLN
500.0000 mg | Freq: Once | INTRAVENOUS | Status: AC
  Administered 2017-03-18: 500 mg via INTRAVENOUS
  Filled 2017-03-18: qty 500

## 2017-03-18 MED ORDER — SODIUM CHLORIDE 0.9 % IV SOLN
500.0000 mg | INTRAVENOUS | Status: DC
  Filled 2017-03-18: qty 500

## 2017-03-18 MED ORDER — ONDANSETRON HCL 4 MG/2ML IJ SOLN: 4.0000 mg | Freq: Four times a day (QID) | INTRAMUSCULAR | Status: DC | PRN

## 2017-03-18 MED ORDER — ACETAMINOPHEN 650 MG RE SUPP: 650.0000 mg | Freq: Four times a day (QID) | RECTAL | Status: DC | PRN

## 2017-03-18 MED ORDER — HYDROCORTISONE NA SUCCINATE PF 100 MG IJ SOLR
INTRAMUSCULAR | Status: AC
  Filled 2017-03-18: qty 2

## 2017-03-18 MED ORDER — LEVOTHYROXINE SODIUM 25 MCG PO TABS
25.0000 ug | ORAL_TABLET | Freq: Every day | ORAL | Status: DC
  Administered 2017-03-19 – 2017-03-22 (×3): 25 ug via ORAL
  Filled 2017-03-18 (×3): qty 1

## 2017-03-18 MED ORDER — METFORMIN HCL 1000 MG PO TABS: ORAL_TABLET | ORAL | 1 refills | Status: DC

## 2017-03-18 MED ORDER — CITALOPRAM HYDROBROMIDE 20 MG PO TABS
40.0000 mg | ORAL_TABLET | Freq: Every day | ORAL | Status: DC
  Administered 2017-03-19 – 2017-03-22 (×3): 40 mg via ORAL
  Filled 2017-03-18 (×3): qty 2

## 2017-03-18 NOTE — Progress Notes (Signed)
Pharmacy Antibiotic Note  [name] is a 47 y.o. female admitted on 03/18/2017 with pneumonia.  Pharmacy has been consulted for azithromycin/ceftriaxone dosing.  Plan: patient received azithro 500 mg and ceftriaxone 1g IV x 1  Will an additional ceftriaxone 1g IV for a total for 2g  Will continue azithro 500 mg IV daily Considering patient's h/o breast cancer, consider broadened abx coverage if patient does not improve.  Height: 5\' 2"  (157.5 cm) Weight: 164 lb (74.4 kg) IBW/kg (Calculated) : 50.1  Temp (24hrs), Avg:97.7 F (36.5 C), Min:97.7 F (36.5 C), Max:97.7 F (36.5 C)  Recent Labs  Lab 03/18/17 1702 03/18/17 2008  WBC 21.7*  --   CREATININE 4.13*  --   LATICACIDVEN  --  2.3*    Estimated Creatinine Clearance: 15.7 mL/min (A) (by C-G formula based on SCr of 4.13 mg/dL (H)).    Allergies  Allergen Reactions  . Tramadol     Mouth feels like it's on fire    Thank you for allowing pharmacy to be a part of this patient's care.  Tobie Lords, PharmD, BCPS Clinical Pharmacist 03/18/2017

## 2017-03-18 NOTE — ED Triage Notes (Signed)
Pt drifting off to sleep in triage in between triage questions, states she has not taken morphine for pain today, "no one will let me."

## 2017-03-18 NOTE — ED Provider Notes (Addendum)
[facility] Emergency Department Provider Note  Time seen: 6:42 PM  I have reviewed the triage vital signs and the nursing notes.   HISTORY  Chief Complaint Weakness and Aphasia    HPI [name] is a 47 y.o. female with a past medical history of anemia, bipolar, migraines, MI, obesity presents to the emergency department with slurred speech and fatigue.  Patient had a right shoulder surgery performed 2 days ago, son states the patient awoke this morning with slurred speech appeared to be confused and weak.  Did not allow patient take her pain medication this morning due to her presentation.  States she continued to appear weak with slurred speech throughout the day today so they brought her to the emergency department for evaluation.  Here patient is somewhat somnolent, mildly hypoxic 89% on room air with no home O2 requirement.  Patient denies any fever, cough, congestion, vomiting, abdominal pain, dysuria.  Patient is somnolent appearing with dry mucous membranes.   Past Medical History:  Diagnosis Date  . Anemia    iron deficiency anemia  . Aortic arch aneurysm ([place])   . Bipolar disorder ([place])   . BRCA negative 2014  . Colon polyp   . Family history of breast cancer    BRCA neg 2014  . Gastric ulcer 04/27/2011  . Headache    Migraines  . Malignant melanoma of skin of scalp ([place])   . MI, acute, non ST segment elevation ([place])   . Morbid obesity ([place])    a. s/p gastric bypass.  . Myocardial infarction ([place]) 06/03/2015  . Neuromuscular disorder ([place])   . S/P drug eluting coronary stent placement 06/04/2015    Patient Active Problem List   Diagnosis Date Noted  . Insomnia 03/18/2017  . Ischemic cardiomyopathy   . Arthritis   . Anxiety   . Acute colitis 01/27/2017  . Hx of colonic polyps   . Bariatric surgery status   . Sinus tachycardia 02/27/2016  . H/O medication noncompliance 12/14/2015  . Acute pyelonephritis   . Emesis   . CKD  (chronic kidney disease), stage IV ([place])   . CAD (coronary artery disease)   . Hypertensive heart disease   . CKD (chronic kidney disease) stage 3, GFR 30-59 ml/min (HCC) 06/05/2015  . Presence of drug coated stent in LAD coronary artery & now in D1 06/04/2015  . Colitis 06/03/2015  . Carotid stenosis 04/30/2015  . Type 2 diabetes mellitus with complication, without long-term current use of insulin ([place])   . Atypical chest pain   . Iron deficiency anemia 03/22/2015  . Vitamin B12 deficiency 02/18/2015  . Misuse of medications for pain 02/18/2015  . Severe recurrent major depression without psychotic features ([place]) 02/15/2015  . Helicobacter pylori infection 11/23/2014  . Hemiparesis, left ([place]) 11/23/2014  . Benign neoplasm of colon 11/20/2014  . Malignant melanoma ([place]) 08/25/2014  . Chronic systolic CHF (congestive heart failure) ([place])   . Incomplete bladder emptying 07/12/2014  . Adult hypothyroidism 12/30/2013  . Aberrant subclavian artery 11/17/2013  . Multiple sclerosis ([place]) 11/02/2013  . History of CVA (cerebrovascular accident) 06/20/2013  . Headache, migraine 05/29/2013  . Hyperlipidemia   . GERD (gastroesophageal reflux disease)   . Neuropathy ([place]) 01/02/2011  . CVA (cerebral vascular accident) ([place]) 06/21/2008  . Essential hypertension 05/01/2008    Past Surgical History:  Procedure Laterality Date  . APPENDECTOMY    . CARDIAC CATHETERIZATION N/A 11/09/2014   Procedure: Coronary Angiography;  Surgeon: Christia Reading  Gloriajean Dell, MD;  Location: [facility];  Service: Cardiovascular;  Laterality: N/A;  . CARDIAC CATHETERIZATION N/A 11/12/2014   Procedure: Coronary Stent Intervention;  Surgeon: Isaias Cowman, MD;  Location: [place] [facility];  Service: Cardiovascular;  Laterality: N/A;  . CARDIAC CATHETERIZATION N/A 04/18/2015   Procedure: Left Heart Cath and Coronary Angiography;  Surgeon: Minna Merritts, MD;  Location: [place] [facility];  Service:  Cardiovascular;  Laterality: N/A;  . CARDIAC CATHETERIZATION Left 06/04/2015   Procedure: Left Heart Cath and Coronary Angiography;  Surgeon: Wellington Hampshire, MD;  Location: [place] [facility];  Service: Cardiovascular;  Laterality: Left;  . CARDIAC CATHETERIZATION N/A 06/04/2015   Procedure: Coronary Stent Intervention;  Surgeon: Wellington Hampshire, MD;  Location: [place];  Service: Cardiovascular;  Laterality: N/A;  . CESAREAN SECTION  2001  . CHOLECYSTECTOMY N/A 11/18/2016   Procedure: LAPAROSCOPIC CHOLECYSTECTOMY WITH INTRAOPERATIVE CHOLANGIOGRAM;  Surgeon: Christene Lye, MD;  Location: [facility];  Service: General;  Laterality: N/A;  . COLONOSCOPY WITH PROPOFOL N/A 04/27/2016   Procedure: COLONOSCOPY WITH PROPOFOL;  Surgeon: [name], MD;  Location: [facility];  Service: Endoscopy;  Laterality: N/A;  . CORONARY ANGIOPLASTY    . DILATION AND CURETTAGE OF UTERUS    . ESOPHAGOGASTRODUODENOSCOPY (EGD) WITH PROPOFOL N/A 09/14/2014   Procedure: ESOPHAGOGASTRODUODENOSCOPY (EGD) WITH PROPOFOL;  Surgeon: Josefine Class, MD;  Location: [facility] ENDOSCOPY;  Service: Endoscopy;  Laterality: N/A;  . ESOPHAGOGASTRODUODENOSCOPY (EGD) WITH PROPOFOL N/A 04/27/2016   Procedure: ESOPHAGOGASTRODUODENOSCOPY (EGD) WITH PROPOFOL;  Surgeon: [name], MD;  Location: [facility];  Service: Endoscopy;  Laterality: N/A;  Diabetic - oral meds  . GASTRIC BYPASS  09/2009   [facility]   . Left Carotid to sublcavian artery bypass w/ subclavian artery ligation     a. Performed @ [facility].  . MELANOMA EXCISION  2016   Dr. Evorn Gong  . [place]  2002  . RIGHT OOPHORECTOMY    . SHOULDER ARTHROSCOPY WITH OPEN ROTATOR CUFF REPAIR Right 01/07/2016   Procedure: SHOULDER ARTHROSCOPY WITH DEBRIDMENT, SUBACHROMIAL DECOMPRESSION;  Surgeon: Corky Mull, MD;  Location: [facility];  Service: Orthopedics;  Laterality: Right;  . SHOULDER ARTHROSCOPY WITH OPEN ROTATOR CUFF  REPAIR Right 03/16/2017   Procedure: SHOULDER ARTHROSCOPY WITH OPEN ROTATOR CUFF REPAIR POSSIBLE BICEPS TENODESIS;  Surgeon: Corky Mull, MD;  Location: [facility];  Service: Orthopedics;  Laterality: Right;  . TRIGGER FINGER RELEASE Right     [place]    Prior to Admission medications   Medication Sig Start Date End Date Taking? Authorizing Provider  ALPRAZolam Duanne Moron) 0.5 MG tablet Take 0.5-1 tablets (0.25-0.5 mg total) by mouth every 6 (six) hours as needed for anxiety. Patient taking differently: Take 0.5-1 mg by mouth every 6 (six) hours as needed for anxiety.  01/08/17   [name], MD  aspirin EC 81 MG tablet Take 1 tablet (81 mg total) by mouth daily. 02/19/15   Hildred Priest, MD  Blood Glucose Monitoring Suppl (ONE TOUCH ULTRA 2) w/Device KIT Use to check blood sugar once a day. Dx. E11.9 03/18/17   [name], MD  buPROPion (WELLBUTRIN XL) 150 MG 24 hr tablet Take 1 tablet (150 mg total) by mouth daily. 03/18/17   [name], MD  citalopram (CELEXA) 40 MG tablet Take 1 tablet (40 mg total) by mouth daily. 01/13/17   [name], MD  diltiazem (CARDIZEM CD) 120 MG 24 hr capsule  Take 1 capsule (120 mg total) by mouth daily. 01/31/16   Minna Merritts, MD  ezetimibe (ZETIA) 10 MG tablet Take 1 tablet (10 mg total) daily by mouth. Patient taking differently: Take 10 mg by mouth at bedtime.  12/09/16   [name], MD  gabapentin (NEURONTIN) 600 MG tablet Take 1-2 tablets (600-1,200 mg total) by mouth 2 (two) times daily. Take 1200 mg in the morning and 600 mg in the evening. 03/16/17   Poggi, Marshall Cork, MD  hyoscyamine (LEVSIN SL) 0.125 MG SL tablet Place 1 tablet (0.125 mg total) under the tongue every 6 (six) hours as needed. 02/01/17   [name], MD  levothyroxine (SYNTHROID, LEVOTHROID) 25 MCG tablet Take 1 tablet (25 mcg total) daily before breakfast by mouth. 12/18/16   [name], MD  losartan (COZAAR) 100 MG tablet Take 100 mg by  mouth daily.    [provider]  metFORMIN (GLUCOPHAGE) 1000 MG tablet TAKE ONE TABLET BY MOUTH ONCE A DAY DAILY 03/18/17   [name], MD  metoprolol succinate (TOPROL-XL) 25 MG 24 hr tablet Take 1 tablet (25 mg total) by mouth daily. 03/16/17   Poggi, Marshall Cork, MD  morphine (MSIR) 15 MG tablet Take 1 tablet (15 mg total) by mouth every 6 (six) hours as needed for moderate pain or severe pain. 03/16/17   Poggi, Marshall Cork, MD  nitroGLYCERIN (NITROSTAT) 0.4 MG SL tablet Place 1 tablet (0.4 mg total) under the tongue every 5 (five) minutes as needed for chest pain. 04/30/15   Minna Merritts, MD  pantoprazole (PROTONIX) 40 MG tablet Take 1 tablet (40 mg total) by mouth 2 (two) times daily. 05/11/16   [name], MD  propranolol ER (INDERAL LA) 60 MG 24 hr capsule Take 60 mg by mouth at bedtime.    [provider]  tiZANidine (ZANAFLEX) 4 MG tablet Take 4 mg by mouth 2 (two) times daily as needed (migraines).    [provider]  zolpidem (AMBIEN) 5 MG tablet Take 1-2 tablets (5-10 mg total) by mouth at bedtime as needed for sleep. 11/28/16   [name], MD    Allergies  Allergen Reactions  . Tramadol     Mouth feels like it's on fire    Family History  Problem Relation Age of Onset  . Hypertension Mother   . Anxiety disorder Mother   . Depression Mother   . Bipolar disorder Mother   . Heart disease Mother   . Hyperlipidemia Mother   . Kidney disease Father   . Heart disease Father   . Hypertension Father   . Diabetes Father   . Stroke Father   . Colon cancer Father        dx in his 64's  . Anxiety disorder Father   . Depression Father   . Skin cancer Father   . Kidney disease Sister   . Thyroid nodules Sister   . Hypertension Sister   . Hypertension Sister   . Diabetes Sister   . Hyperlipidemia Sister   . Depression Sister   . Breast cancer Maternal Aunt 73  . Breast cancer Maternal Aunt 79  . Ovarian cancer Cousin   . Colon cancer Cousin    . Breast cancer Other   . Kidney cancer Neg Hx   . Bladder Cancer Neg Hx     Social History Social History   Tobacco Use  . Smoking status: Former Smoker    Types:  Cigarettes    Last attempt to quit: 08/31/1994    Years since quitting: 22.5  . Smokeless tobacco: Never Used  . Tobacco comment: quit 28 years ago  Substance Use Topics  . Alcohol use: No    Alcohol/week: 0.0 oz  . Drug use: No    Review of Systems Constitutional: Negative for fever. Eyes: Negative for visual complaints ENT: Negative for recent illness/congestion Cardiovascular: Negative for chest pain. Respiratory: Negative for shortness of breath. Gastrointestinal: Negative for abdominal pain, vomiting and diarrhea. Genitourinary: Negative for urinary compaints Musculoskeletal: Negative for musculoskeletal complaints Skin: Negative for skin complaints  Neurological: Negative for headache All other ROS negative  ____________________________________________   PHYSICAL EXAM:  VITAL SIGNS: ED Triage Vitals  Enc Vitals Group     BP 03/18/17 1640 (!) 67/52     Pulse Rate 03/18/17 1640 79     Resp 03/18/17 1640 16     Temp 03/18/17 1640 97.7 F (36.5 C)     Temp Source 03/18/17 1640 Oral     SpO2 03/18/17 1640 96 %     Weight 03/18/17 1651 164 lb (74.4 kg)     Height 03/18/17 1651 _0  (1.575 m)     Head Circumference --      Peak Flow --      Pain Score 03/18/17 1644 8     Pain Loc --      Pain Edu? --      Excl. in [place]? --     Constitutional: Somnolent appearing but is alert, oriented, answers questions but slow to answer questions.  Follows commands. Eyes: Normal exam ENT   Head: Normocephalic and atraumatic.   Mouth/Throat: Mucous membranes are moist. Cardiovascular: Normal rate, regular rhythm. No murmur Respiratory: Normal respiratory effort without tachypnea nor retractions. Breath sounds are clear  Gastrointestinal: Soft and nontender. No distention.   Musculoskeletal: Nontender  with normal range of motion in all extremities. Neurologic:  Normal speech and language. No gross focal neurologic deficits Skin:  Skin is warm, dry and intact.  Psychiatric: Mood and affect are normal. Speech and behavior are normal.   ____________________________________________     RADIOLOGY  X-ray shows right lower lobe pneumonia. CT negative for acute abnormality.  EKG reviewed and interpreted by myself shows normal sinus rhythm 80 bpm with a narrow QRS, normal axis, normal intervals with nonspecific ST changes.  No ST elevation.  ____________________________________________   INITIAL IMPRESSION / ASSESSMENT AND PLAN / ED COURSE  Pertinent labs & imaging results that were available during my care of the patient were reviewed by me and considered in my medical decision making (see chart for details).  Patient presents emergency department for weakness, somnolence with slurred speech.  Patient CT head shows no acute abnormality.  Patient is somnolent but otherwise appears well.  Mild hypoxia 89% on room air, no home O2 requirement.  Patient differential would include pneumonia, infectious etiology, metabolic or electrolyte abnormality, dehydration, medication reaction.  Given the patient's x-ray findings consistent with right lower lobe pneumonia, white blood cell count of 21,000 we will check blood cultures begin IV antibiotics.  Patient will require admission to the hospital for further treatment.  Patient's chemistry is resulted showing a significant decline in renal function as well.  We will continue with IV hydration.  Given the patient's elevated white blood cell count of 21,000 with hypoxia have initiated sepsis protocols we will begin broad-spectrum antibiotics, cultures, lactic acid.  Admit to the hospitalist service for further treatment.  CRITICAL CARE Performed by: Harvest Dark   Total critical care time: 30 minutes  Critical care time was exclusive of  separately billable procedures and treating other patients.  Critical care was necessary to treat or prevent imminent or life-threatening deterioration.  Critical care was time spent personally by me on the following activities: development of treatment plan with patient and/or surrogate as well as nursing, discussions with consultants, evaluation of patient's response to treatment, examination of patient, obtaining history from patient or surrogate, ordering and performing treatments and interventions, ordering and review of laboratory studies, ordering and review of radiographic studies, pulse oximetry and re-evaluation of patient's condition.   ____________________________________________   FINAL CLINICAL IMPRESSION(S) / ED DIAGNOSES  Sepsis Right lower lobe pneumonia Acute renal insufficiency    Harvest Dark, MD 03/18/17 1945    Harvest Dark, MD 03/18/17 2203

## 2017-03-18 NOTE — ED Notes (Signed)
Placed pt on 2L nasal cannula to ease breathing. Pt O2 kept dropping to high 80s while sleeping.

## 2017-03-18 NOTE — ED Triage Notes (Signed)
Pt here with c/o slurred speech and weakness per her son that began around 7:30 this am. Son states she has MS and has a history of stroke in the past. Pt states she had shoulder surgery on Tuesday, last pain pill was last night at 2100 per son.  Blood glucose 192 in triage. Speech slurred from past stroke, however, son states more slurring at this time.

## 2017-03-18 NOTE — ED Notes (Signed)
Date and time results received: 03/18/17 2117 (use smartphrase ".now" to insert current time)  Test: Lactic Acid Critical Value: 2.3  Name of Provider Notified: Dr. Lucita Lora  Orders Received? Or Actions Taken?:

## 2017-03-18 NOTE — Progress Notes (Signed)
CODE SEPSIS - PHARMACY COMMUNICATION  **Broad Spectrum Antibiotics should be administered within 1 hour of Sepsis diagnosis**  Time Code Sepsis Called/Page Received: 1954  Antibiotics Ordered: azithro/ceftriaxone  Time of 1st antibiotic administration: 2031  Additional action taken by pharmacy:   If necessary, Name of Provider/Nurse Contacted:     Tobie Lords ,PharmD Clinical Pharmacist  03/18/2017  9:39 PM

## 2017-03-18 NOTE — ED Notes (Signed)
Called to give report to floor, spoke to [facility] and she stated that would call me back when she figures out a room for the pt.

## 2017-03-18 NOTE — ED Notes (Signed)
Bilateral grips weak but equal, pt states bilateral legs are "heavy," however is able to feel light touch. Face symmetrical. Son at bedside. Report given to Derl Barrow, RN

## 2017-03-18 NOTE — H&P (Addendum)
[name] at [place] NAME: [name]    MR#:  [id number]  DATE OF BIRTH:  1970-03-05  DATE OF ADMISSION:  03/18/2017  PRIMARY CARE PHYSICIAN: Kendra Sons, MD   REQUESTING/REFERRING PHYSICIAN: Dr. Harvest Dark  CHIEF COMPLAINT:   Chief Complaint  Patient presents with  . Weakness  . Aphasia    HISTORY OF PRESENT ILLNESS:  [name]  is a 47 y.o. female with a known history of breast cancer, bipolar disorder, chronic pain, previous history of MI, history of coronary artery disease status post an placement, recent right shoulder surgery secondary to rotator cuff repair who presents to the hospital due to altered mental status and lethargy. Patient is somewhat altered therefore most of the history obtained from the son at bedside. As per the son patient has had a previous stroke and he was worried that his mom was having another stroke as she was lethargic, altered, mumbling words and not acting like herself. Patient did have recent surgery and is on high-dose narcotics but has not received any medications over the past 12 hours per the patient's son. Patient denies any cough, congestion, fever, chills. She denies any nausea vomiting abdominal pain or any other associated symptoms. Patient presented to the ER underwent a CT head which was negative for acute CVA but she was noted to be in acute on chronic kidney injury and also noted to have a leukocytosis with chest x-ray findings suggestive of pneumonia. Hospitalist services were contacted further treatment and evaluation.  PAST MEDICAL HISTORY:   Past Medical History:  Diagnosis Date  . Anemia    iron deficiency anemia  . Aortic arch aneurysm ([place])   . Bipolar disorder ([place])   . BRCA negative 2014  . Colon polyp   . Family history of breast cancer    BRCA neg 2014  . Gastric ulcer 04/27/2011  . Headache    Migraines  . Malignant melanoma of skin of scalp ([place])   . MI,  acute, non ST segment elevation ([place])   . Morbid obesity ([place])    a. s/p gastric bypass.  . Myocardial infarction ([place]) 06/03/2015  . Neuromuscular disorder ([place])   . S/P drug eluting coronary stent placement 06/04/2015    PAST SURGICAL HISTORY:   Past Surgical History:  Procedure Laterality Date  . APPENDECTOMY    . CARDIAC CATHETERIZATION N/A 11/09/2014   Procedure: Coronary Angiography;  Surgeon: Minna Merritts, MD;  Location: [facility];  Service: Cardiovascular;  Laterality: N/A;  . CARDIAC CATHETERIZATION N/A 11/12/2014   Procedure: Coronary Stent Intervention;  Surgeon: Isaias Cowman, MD;  Location: [facility];  Service: Cardiovascular;  Laterality: N/A;  . CARDIAC CATHETERIZATION N/A 04/18/2015   Procedure: Left Heart Cath and Coronary Angiography;  Surgeon: Minna Merritts, MD;  Location: [facility];  Service: Cardiovascular;  Laterality: N/A;  . CARDIAC CATHETERIZATION Left 06/04/2015   Procedure: Left Heart Cath and Coronary Angiography;  Surgeon: Wellington Hampshire, MD;  Location: [facility];  Service: Cardiovascular;  Laterality: Left;  . CARDIAC CATHETERIZATION N/A 06/04/2015   Procedure: Coronary Stent Intervention;  Surgeon: Wellington Hampshire, MD;  Location: [facility];  Service: Cardiovascular;  Laterality: N/A;  . CESAREAN SECTION  2001  . CHOLECYSTECTOMY N/A 11/18/2016   Procedure: LAPAROSCOPIC CHOLECYSTECTOMY WITH INTRAOPERATIVE CHOLANGIOGRAM;  Surgeon: Christene Lye, MD;  Location: [facility];  Service: General;  Laterality: N/A;  .  COLONOSCOPY WITH PROPOFOL N/A 04/27/2016   Procedure: COLONOSCOPY WITH PROPOFOL;  Surgeon: [name], MD;  Location: [facility];  Service: Endoscopy;  Laterality: N/A;  . CORONARY ANGIOPLASTY    . DILATION AND CURETTAGE OF UTERUS    . ESOPHAGOGASTRODUODENOSCOPY (EGD) WITH PROPOFOL N/A 09/14/2014   Procedure: ESOPHAGOGASTRODUODENOSCOPY (EGD) WITH PROPOFOL;  Surgeon:  Josefine Class, MD;  Location: [facility];  Service: Endoscopy;  Laterality: N/A;  . ESOPHAGOGASTRODUODENOSCOPY (EGD) WITH PROPOFOL N/A 04/27/2016   Procedure: ESOPHAGOGASTRODUODENOSCOPY (EGD) WITH PROPOFOL;  Surgeon: [name], MD;  Location: [facility];  Service: Endoscopy;  Laterality: N/A;  Diabetic - oral meds  . GASTRIC BYPASS  09/2009   [facility]   . Left Carotid to sublcavian artery bypass w/ subclavian artery ligation     a. Performed @ [facility].  . MELANOMA EXCISION  2016   Dr. Evorn Gong  . [place]  2002  . RIGHT OOPHORECTOMY    . SHOULDER ARTHROSCOPY WITH OPEN ROTATOR CUFF REPAIR Right 01/07/2016   Procedure: SHOULDER ARTHROSCOPY WITH DEBRIDMENT, SUBACHROMIAL DECOMPRESSION;  Surgeon: Corky Mull, MD;  Location: [facility];  Service: Orthopedics;  Laterality: Right;  . SHOULDER ARTHROSCOPY WITH OPEN ROTATOR CUFF REPAIR Right 03/16/2017   Procedure: SHOULDER ARTHROSCOPY WITH OPEN ROTATOR CUFF REPAIR POSSIBLE BICEPS TENODESIS;  Surgeon: Corky Mull, MD;  Location: [facility];  Service: Orthopedics;  Laterality: Right;  . TRIGGER FINGER RELEASE Right     [place]    SOCIAL HISTORY:   Social History   Tobacco Use  . Smoking status: Former Smoker    Types: Cigarettes    Last attempt to quit: 08/31/1994    Years since quitting: 22.5  . Smokeless tobacco: Never Used  . Tobacco comment: quit 28 years ago  Substance Use Topics  . Alcohol use: No    Alcohol/week: 0.0 oz    FAMILY HISTORY:   Family History  Problem Relation Age of Onset  . Hypertension Mother   . Anxiety disorder Mother   . Depression Mother   . Bipolar disorder Mother   . Heart disease Mother   . Hyperlipidemia Mother   . Kidney disease Father   . Heart disease Father   . Hypertension Father   . Diabetes Father   . Stroke Father   . Colon cancer Father        dx in his 53's  . Anxiety disorder Father   . Depression Father   . Skin cancer Father   .  Kidney disease Sister   . Thyroid nodules Sister   . Hypertension Sister   . Hypertension Sister   . Diabetes Sister   . Hyperlipidemia Sister   . Depression Sister   . Breast cancer Maternal Aunt 67  . Breast cancer Maternal Aunt 17  . Ovarian cancer Cousin   . Colon cancer Cousin   . Breast cancer Other   . Kidney cancer Neg Hx   . Bladder Cancer Neg Hx     DRUG ALLERGIES:   Allergies  Allergen Reactions  . Tramadol     Mouth feels like it's on fire    REVIEW OF SYSTEMS:   Review of Systems  Constitutional: Negative for fever and weight loss.  HENT: Negative for congestion, nosebleeds and tinnitus.   Eyes: Negative for blurred vision, double vision and redness.  Respiratory: Negative for cough, hemoptysis and shortness of breath.   Cardiovascular: Negative for chest pain, orthopnea, leg swelling and PND.  Gastrointestinal: Negative  for abdominal pain, diarrhea, melena, nausea and vomiting.  Genitourinary: Negative for dysuria, hematuria and urgency.  Musculoskeletal: Negative for falls and joint pain.  Neurological: Positive for weakness. Negative for dizziness, tingling, sensory change, focal weakness, seizures and headaches.  Endo/Heme/Allergies: Negative for polydipsia. Does not bruise/bleed easily.  Psychiatric/Behavioral: Negative for depression and memory loss. The patient is not nervous/anxious.     MEDICATIONS AT HOME:   Prior to Admission medications   Medication Sig Start Date End Date Taking? Authorizing Provider  ALPRAZolam Duanne Moron) 0.5 MG tablet Take 0.5-1 tablets (0.25-0.5 mg total) by mouth every 6 (six) hours as needed for anxiety. Patient taking differently: Take 0.5-1 mg by mouth every 6 (six) hours as needed for anxiety.  01/08/17  Yes Kendra Sons, MD  aspirin EC 81 MG tablet Take 1 tablet (81 mg total) by mouth daily. 02/19/15  Yes Hildred Priest, MD  Blood Glucose Monitoring Suppl (ONE TOUCH ULTRA 2) w/Device KIT Use to check blood  sugar once a day. Dx. E11.9 03/18/17  Yes Kendra Sons, MD  buPROPion (WELLBUTRIN XL) 150 MG 24 hr tablet Take 1 tablet (150 mg total) by mouth daily. 03/18/17  Yes Kendra Sons, MD  citalopram (CELEXA) 40 MG tablet Take 1 tablet (40 mg total) by mouth daily. 01/13/17  Yes Kendra Sons, MD  diltiazem (CARDIZEM CD) 120 MG 24 hr capsule Take 1 capsule (120 mg total) by mouth daily. 01/31/16  Yes Minna Merritts, MD  ezetimibe (ZETIA) 10 MG tablet Take 1 tablet (10 mg total) daily by mouth. Patient taking differently: Take 10 mg by mouth at bedtime.  12/09/16  Yes Kendra Sons, MD  gabapentin (NEURONTIN) 600 MG tablet Take 1-2 tablets (600-1,200 mg total) by mouth 2 (two) times daily. Take 1200 mg in the morning and 600 mg in the evening. 03/16/17  Yes Poggi, Marshall Cork, MD  hyoscyamine (LEVSIN SL) 0.125 MG SL tablet Place 1 tablet (0.125 mg total) under the tongue every 6 (six) hours as needed. Patient taking differently: Place 0.125 mg under the tongue every 6 (six) hours as needed for cramping.  02/01/17  Yes Kendra Sons, MD  levothyroxine (SYNTHROID, LEVOTHROID) 25 MCG tablet Take 1 tablet (25 mcg total) daily before breakfast by mouth. 12/18/16  Yes Kendra Sons, MD  losartan (COZAAR) 100 MG tablet Take 100 mg by mouth daily.   Yes [provider]  metFORMIN (GLUCOPHAGE) 1000 MG tablet TAKE ONE TABLET BY MOUTH ONCE A DAY DAILY Patient taking differently: Take 500 mg by mouth daily. TAKE ONE TABLET BY MOUTH ONCE A DAY DAILY 03/18/17  Yes Kendra Sons, MD  metoprolol succinate (TOPROL-XL) 25 MG 24 hr tablet Take 1 tablet (25 mg total) by mouth daily. 03/16/17  Yes Poggi, Marshall Cork, MD  morphine (MSIR) 15 MG tablet Take 1 tablet (15 mg total) by mouth every 6 (six) hours as needed for moderate pain or severe pain. 03/16/17  Yes Poggi, Marshall Cork, MD  nitroGLYCERIN (NITROSTAT) 0.4 MG SL tablet Place 1 tablet (0.4 mg total) under the tongue every 5 (five) minutes as needed for chest  pain. 04/30/15  Yes Gollan, Kathlene November, MD  pantoprazole (PROTONIX) 40 MG tablet Take 1 tablet (40 mg total) by mouth 2 (two) times daily. 05/11/16  Yes Kendra Sons, MD  propranolol ER (INDERAL LA) 60 MG 24 hr capsule Take 60 mg by mouth at bedtime.   Yes [provider]  tiZANidine (ZANAFLEX) 4 MG tablet  Take 4 mg by mouth 2 (two) times daily as needed for muscle spasms.    Yes [provider]  zolpidem (AMBIEN) 5 MG tablet Take 1-2 tablets (5-10 mg total) by mouth at bedtime as needed for sleep. 11/28/16  Yes Kendra Sons, MD      VITAL SIGNS:  Blood pressure 102/82, pulse 72, temperature 97.7 F (36.5 C), temperature source Oral, resp. rate 16, height '5\' 2"'$  (1.575 m), weight 74.4 kg (164 lb), SpO2 95 %.  PHYSICAL EXAMINATION:  Physical Exam  GENERAL:  47 y.o.-year-old patient lying in the bed lethargic, encephalopathic but arousable to verbal commands in NAD.  EYES: Pupils equal, round, reactive to light and accommodation. No scleral icterus. Extraocular muscles intact.  HEENT: Head atraumatic, normocephalic. Oropharynx and nasopharynx clear. No oropharyngeal erythema, moist oral mucosa  NECK:  Supple, no jugular venous distention. No thyroid enlargement, no tenderness.  LUNGS: Poor Resp. effort, no wheezing, rales, rhonchi. No use of accessory muscles of respiration.  CARDIOVASCULAR: S1, S2 RRR. No murmurs, rubs, gallops, clicks.  ABDOMEN: Soft, nontender, nondistended. Bowel sounds present. No organomegaly or mass.  EXTREMITIES: No pedal edema, cyanosis, or clubbing. + 2 pedal & radial pulses b/l.   NEUROLOGIC: Cranial nerves II through XII are intact. No focal Motor or sensory deficits appreciated b/l. Globally weak.  PSYCHIATRIC: The patient is alert and oriented x 1.  Encephalopathic/lethargic SKIN: No obvious rash, lesion, or ulcer.   LABORATORY PANEL:   CBC Recent Labs  Lab 03/18/17 1702  WBC 21.7*  HGB 13.0  HCT 39.9  PLT 254    ------------------------------------------------------------------------------------------------------------------  Chemistries  Recent Labs  Lab 03/18/17 1702  NA 134*  K 4.9  CL 101  CO2 19*  GLUCOSE 191*  BUN 33*  CREATININE 4.13*  CALCIUM 8.8*   ------------------------------------------------------------------------------------------------------------------  Cardiac Enzymes No results for input(s): TROPONINI in the last 168 hours. ------------------------------------------------------------------------------------------------------------------  RADIOLOGY:  Ct Head Wo Contrast  Result Date: 03/18/2017 CLINICAL DATA:  Slurred speech and unsteady gait. Altered consciousness. EXAM: CT HEAD WITHOUT CONTRAST TECHNIQUE: Contiguous axial images were obtained from the base of the skull through the vertex without intravenous contrast. COMPARISON:  02/16/2015 FINDINGS: Brain: Cerebral atrophy which is age advanced. Right basal ganglia lacunar infarct is favored to be remote but new since 02/16/2015. Example image 15/series 2. There is vague periventricular white matter hypoattenuation, including on images 18 and 19/series 2. This is felt to be similar to on the prior. More diffuse low density in the periventricular white matter likely related to small vessel disease. No mass lesion, hemorrhage, hydrocephalus, acute infarct, intra-axial, or extra-axial fluid collection. Vascular: Bilateral vertebral artery atherosclerosis which is significantly age advanced. There is also right internal carotid atherosclerosis. Skull: Normal Sinuses/Orbits: Normal imaged portions of the orbits and globes. Clear paranasal sinuses and mastoid air cells. Other: None. IMPRESSION: 1.  No acute intracranial abnormality. 2. Cerebral atrophy with mild small vessel ischemic change. Areas of more focal hypoattenuation in the right frontal white matter are felt to be similar to the prior exam and likely related to remote  ischemia. 3. Age advanced intracranial atherosclerosis. 4. Remote but interval right basal ganglia lacunar infarct. Electronically Signed   By: Abigail Miyamoto M.D.   On: 03/18/2017 16:46   Dg Chest Portable 1 View  Result Date: 03/18/2017 CLINICAL DATA:  Weakness EXAM: PORTABLE CHEST 1 VIEW COMPARISON:  01/27/2017 FINDINGS: Cardiac shadow is stable in appearance. The overall inspiratory effort is poor with elevation the right hemidiaphragm. Additionally right  basilar infiltrate is noted medially. No bony abnormality is noted. IMPRESSION: Right basilar infiltrate with hypoinflation Electronically Signed   By: Inez Catalina M.D.   On: 03/18/2017 18:35     IMPRESSION AND PLAN:   47 year old female with past medical history of coronary artery disease status post stent placement, history of previous MI, bipolar disorder, hypertension, chronic pain, diabetes, hypothyroidism, GERD who presented to the hospital due to altered mental status, lethargy and noted to be in acute on chronic renal failure and suspected to have a pneumonia.  1. Altered mental status-this is metabolic encephalopathy, related to high-dose narcotics, acute on chronic renal failure, high doses of Neurontin and also underlying pneumonia. -We'll hold patient's morphine, lower her dose of Neurontin, treat underlying pneumonia with IV ceftriaxone, Zithromax, give her IV fluids for acute kidney injury and follow her mental status. CT head is negative for acute pathology.  2. Acute on chronic renal failure-secondary to dehydration and poor by mouth intake. -Patient baseline creatinine is around 1.7 to 2 and currently elevated at 4. -We'll hydrate the patient with IV fluids, follow BUN and creatinine urine output. Hold losartan, Metformin.   3. Pneumonia-this is suspected given the patient's chest x-ray findings, leukocytosis. -We'll treat patient with IV ceftriaxone, Zithromax. Follow cultures.  4. Leukocytosis-secondary to the  pneumonia. -Follow white cell count after treatment with IV antibiotics.  5. Diabetes type 2 without complication-hold metformin, Place on sliding scale insulin.  6. Essential hypertension-continue Cardizem, Toprol, propranolol  7. Chronic pain-hold patient's morphine given her altered mental status/encephalopathy.  8. GERD-continue Protonix.  9. Hypothyroidism-continue Synthroid.  10. Anxiety/depression-continue Xanax, Wellbutrin, Celexa.  All the records are reviewed and case discussed with ED provider. Management plans discussed with the patient, family and they are in agreement.  CODE STATUS: Full code  TOTAL TIME TAKING CARE OF THIS PATIENT: 45 minutes.    Henreitta Leber M.D on 03/18/2017 at 8:27 PM  Between 7am to 6pm - Pager - [phone]  After 6pm go to [email or web address] - password [email or web address] [place]  Office  [phone]  CC: Primary care physician; Kendra Sons, MD

## 2017-03-18 NOTE — ED Notes (Signed)
Called to give report to floor. Nurse requests to call me back.

## 2017-03-18 NOTE — ED Notes (Signed)
Called floor spoke to charge nurse Kennyth Lose and she will not take pt due to soft BP and lactic acid 2.3. Will call hospitalist to find pt a diff bed assignment.

## 2017-03-18 NOTE — Progress Notes (Signed)
Pt's UA is + for UTI.    Pt. Already on Ceftriaxone for PNA and should cover UTI too.

## 2017-03-18 NOTE — Patient Instructions (Signed)
   Do not take zolpidem or alprazolam while taking morphine

## 2017-03-18 NOTE — Progress Notes (Signed)
Patient: [name] Female    DOB: 10/15/1970   47 y.o.   MRN: [id number] Visit Date: 03/18/2017  Today's Provider: [name], MD   No chief complaint on file.  Subjective:    HPI  Patient present for follow up multiple problems today.   Diabetes Mellitus Type II, Follow-up:   Lab Results  Component Value Date   HGBA1C 5.0 12/08/2016   HGBA1C 5.4 05/18/2016   HGBA1C 5.4 12/30/2015   Last seen for diabetes 3 months ago.  Management since then includes; no changes. She reports good compliance with treatment, but has only been taking metformin once a day since she felt like she was getting low in the mornings.  She is not having side effects. none Current symptoms include  Home blood sugar records: fasting range: not checking  Episodes of hypoglycemia? no   Current Insulin Regimen: n/a Most Recent Eye Exam: over due Weight trend: stable Prior visit with dietician: no Current diet: in general, a "healthy" diet   Current exercise: none  ----------------------------------------------------------------    Hypertension, follow-up:  BP Readings from Last 3 Encounters:  03/16/17 (!) 146/78  03/11/17 122/80  02/03/17 130/82    She was last seen for hypertension 3 months ago.  BP at that visit was 140/90. Management since that visit includes; no changes.She reports good compliance  ----------------------------------------------------------------    Lipid/Cholesterol, Follow-up:   Last seen for this 3 months ago.  Management since that visit includes; labs checked. Patient advised to take both Crestor and Zetia. However she states her neurologist recently told her to stop rosuvastatin so she is now only taking Zetia. However it looks like Dr. [name] discontinued rosuvastatin when she was discharged after shoulder surgery on 03/16/2017. She denies any adverse effects from medication.    Depression She reports she has been more depressed the last  several months. Has no energy or desire to get out of the house. Has been taking citalopram for years and doesn't feel like it helps anymore.    Allergies  Allergen Reactions  . Tramadol     Mouth feels like it's on fire     Current Outpatient Medications:  .  ALPRAZolam (XANAX) 0.5 MG tablet, Take 0.5-1 tablets (0.25-0.5 mg total) by mouth every 6 (six) hours as needed for anxiety. (Patient taking differently: Take 0.5-1 mg by mouth every 6 (six) hours as needed for anxiety. ), Disp: 60 tablet, Rfl: 5 .  aspirin EC 81 MG tablet, Take 1 tablet (81 mg total) by mouth daily., Disp: , Rfl:  .  citalopram (CELEXA) 40 MG tablet, Take 1 tablet (40 mg total) by mouth daily., Disp: 30 tablet, Rfl: 11 .  diltiazem (CARDIZEM CD) 120 MG 24 hr capsule, Take 1 capsule (120 mg total) by mouth daily., Disp: 90 capsule, Rfl: 3 .  ezetimibe (ZETIA) 10 MG tablet, Take 1 tablet (10 mg total) daily by mouth. (Patient taking differently: Take 10 mg by mouth at bedtime. ), Disp: 90 tablet, Rfl: 3 .  gabapentin (NEURONTIN) 600 MG tablet, Take 1-2 tablets (600-1,200 mg total) by mouth 2 (two) times daily. Take 1200 mg in the morning and 600 mg in the evening., Disp: 30 tablet, Rfl: 0 .  hyoscyamine (LEVSIN SL) 0.125 MG SL tablet, Place 1 tablet (0.125 mg total) under the tongue every 6 (six) hours as needed., Disp: 60 tablet, Rfl: 0 .  levothyroxine (SYNTHROID, LEVOTHROID) 25 MCG tablet, Take 1 tablet (25 mcg total)  daily before breakfast by mouth., Disp: 30 tablet, Rfl: 12 .  losartan (COZAAR) 100 MG tablet, Take 100 mg by mouth daily., Disp: , Rfl:  .  metFORMIN (GLUCOPHAGE) 1000 MG tablet, TAKE ONE TABLET BY MOUTH TWICE DAILY (Patient taking differently: TAKE ONE TABLET BY MOUTH ONCE A DAY DAILY), Disp: 60 tablet, Rfl: 12 .  metoprolol succinate (TOPROL-XL) 25 MG 24 hr tablet, Take 1 tablet (25 mg total) by mouth daily., Disp: 30 tablet, Rfl: 0 .  morphine (MSIR) 15 MG tablet, Take 1 tablet (15 mg total) by mouth  every 6 (six) hours as needed for moderate pain or severe pain., Disp: 40 tablet, Rfl: 0 .  nitroGLYCERIN (NITROSTAT) 0.4 MG SL tablet, Place 1 tablet (0.4 mg total) under the tongue every 5 (five) minutes as needed for chest pain., Disp: 25 tablet, Rfl: 6 .  pantoprazole (PROTONIX) 40 MG tablet, Take 1 tablet (40 mg total) by mouth 2 (two) times daily., Disp: 60 tablet, Rfl: 5 .  propranolol ER (INDERAL LA) 60 MG 24 hr capsule, Take 60 mg by mouth at bedtime., Disp: , Rfl:  .  tiZANidine (ZANAFLEX) 4 MG tablet, Take 4 mg by mouth 2 (two) times daily as needed (migraines)., Disp: , Rfl:  .  zolpidem (AMBIEN) 5 MG tablet, Take 1-2 tablets (5-10 mg total) by mouth at bedtime as needed for sleep., Disp: 40 tablet, Rfl: 3  Review of Systems  Constitutional: Negative for appetite change, chills, fatigue and fever.  Respiratory: Negative for chest tightness and shortness of breath.   Cardiovascular: Negative for chest pain and palpitations.  Gastrointestinal: Negative for abdominal pain, nausea and vomiting.  Neurological: Negative for dizziness and weakness.    Social History   Tobacco Use  . Smoking status: Former Smoker    Types: Cigarettes    Last attempt to quit: 08/31/1994    Years since quitting: 22.5  . Smokeless tobacco: Never Used  . Tobacco comment: quit 28 years ago  Substance Use Topics  . Alcohol use: No    Alcohol/week: 0.0 oz   Objective:   BP 100/60 (BP Location: Left Arm, Patient Position: Sitting, Cuff Size: Normal)   Pulse 74   Temp 97.7 F (36.5 C) (Oral)   Resp 16   Ht 5\' 2"  (1.575 m)   Wt 164 lb (74.4 kg)   SpO2 99%   BMI 30.00 kg/m     Physical Exam   General Appearance:    Alert, cooperative, no distress  Eyes:    PERRL, conjunctiva/corneas clear, EOM's intact       Lungs:     Clear to auscultation bilaterally, respirations unlabored  Heart:    Regular rate and rhythm  Neurologic:   Awake but somnlent, oriented x 3. No apparent focal neurological            defect.       Results for orders placed or performed in visit on 03/18/17  POCT glycosylated hemoglobin (Hb A1C)  Result Value Ref Range   Hemoglobin A1C 5.7    Est. average glucose Bld gHb Est-mCnc 117        Assessment & Plan:     1. Type 2 diabetes mellitus with complication, without long-term current use of insulin (HCC) Well controlled.  May continue on one metformin daily  - POCT glycosylated hemoglobin (Hb A1C)  2. Essential hypertension Well controlled.  Continue current medications.    3. Coronary artery disease involving native coronary artery of native heart with  angina pectoris with documented spasm (HCC) Asymptomatic. Compliant with medication.  Continue aggressive risk factor modification.    4. Pure hypercholesterolemia Rosuvastatin was apparently stopped when she was discharged after shoulder surgery 2 days ago. Will continue ezetimibe for now and anticipated restarting rosuvastatin at follow up in 1 month.   5. Depression, unspecified depression type Worsening, likely aggravate by recent separating from husband and health issues. She declined psychology referral today. Continue SSRI and add low dose bupropion XR 150mg  a day for the time being.   6. Insomnia, unspecified type Neurology recently stopped trazodone and prescribed Lunesta, but she states she has not started taking it yet. Advised not to take any sedatives including lunesta, zolpidem or alprazolam until she stops morphine that was recently prescribed for post-op pain.        [name], MD  [facility]

## 2017-03-18 NOTE — ED Triage Notes (Signed)
Brought in via family   Per family she woke up with slurred speech and unsteady gait this am  also has been confused  Was seen by Pcp this am and sent home

## 2017-03-19 ENCOUNTER — Inpatient Hospital Stay: Payer: Medicare Other

## 2017-03-19 ENCOUNTER — Inpatient Hospital Stay (HOSPITAL_COMMUNITY)
Admit: 2017-03-19 | Discharge: 2017-03-19 | Disposition: A | Discharge status: Home / Self Care | Payer: Medicare Other | Attending: Adult Health | Admitting: Adult Health

## 2017-03-19 DIAGNOSIS — N289 Disorder of kidney and ureter, unspecified: Secondary | ICD-10-CM

## 2017-03-19 DIAGNOSIS — N12 Tubulo-interstitial nephritis, not specified as acute or chronic: Secondary | ICD-10-CM

## 2017-03-19 DIAGNOSIS — I119 Hypertensive heart disease without heart failure: Secondary | ICD-10-CM

## 2017-03-19 DIAGNOSIS — A419 Sepsis, unspecified organism: Principal | ICD-10-CM

## 2017-03-19 DIAGNOSIS — I25111 Atherosclerotic heart disease of native coronary artery with angina pectoris with documented spasm: Secondary | ICD-10-CM

## 2017-03-19 LAB — MAGNESIUM
Magnesium: 1.2 mg/dL — ABNORMAL LOW (ref 1.7–2.4)
Magnesium: 2.3 mg/dL (ref 1.7–2.4)

## 2017-03-19 LAB — ECHOCARDIOGRAM COMPLETE
HEIGHTINCHES: 62 in
WEIGHTICAEL: 2850.11 [oz_av]

## 2017-03-19 LAB — MRSA PCR SCREENING: MRSA BY PCR: NEGATIVE

## 2017-03-19 LAB — CBC
HCT: 34.8 % — ABNORMAL LOW (ref 35.0–47.0)
HEMOGLOBIN: 11.4 g/dL — AB (ref 12.0–16.0)
MCH: 27.6 pg (ref 26.0–34.0)
MCHC: 32.7 g/dL (ref 32.0–36.0)
MCV: 84.6 fL (ref 80.0–100.0)
PLATELETS: 176 10*3/uL (ref 150–440)
RBC: 4.11 MIL/uL (ref 3.80–5.20)
RDW: 13.4 % (ref 11.5–14.5)
WBC: 19.9 10*3/uL — ABNORMAL HIGH (ref 3.6–11.0)

## 2017-03-19 LAB — BASIC METABOLIC PANEL
ANION GAP: 8 (ref 5–15)
Anion gap: 9 (ref 5–15)
BUN: 33 mg/dL — ABNORMAL HIGH (ref 6–20)
BUN: 33 mg/dL — ABNORMAL HIGH (ref 6–20)
CALCIUM: 8.2 mg/dL — AB (ref 8.9–10.3)
CO2: 16 mmol/L — AB (ref 22–32)
CO2: 16 mmol/L — ABNORMAL LOW (ref 22–32)
CREATININE: 3.76 mg/dL — AB (ref 0.44–1.00)
CREATININE: 3.89 mg/dL — AB (ref 0.44–1.00)
Calcium: 7.3 mg/dL — ABNORMAL LOW (ref 8.9–10.3)
Chloride: 109 mmol/L (ref 101–111)
Chloride: 113 mmol/L — ABNORMAL HIGH (ref 101–111)
GFR calc Af Amer: 15 mL/min — ABNORMAL LOW (ref >60)
GFR calc non Af Amer: 13 mL/min — ABNORMAL LOW (ref >60)
GFR, EST AFRICAN AMERICAN: 15 mL/min — AB (ref >60)
GFR, EST NON AFRICAN AMERICAN: 13 mL/min — AB (ref >60)
GLUCOSE: 192 mg/dL — AB (ref 65–99)
Glucose, Bld: 155 mg/dL — ABNORMAL HIGH (ref 65–99)
Potassium: 5.4 mmol/L — ABNORMAL HIGH (ref 3.5–5.1)
Potassium: 5.7 mmol/L — ABNORMAL HIGH (ref 3.5–5.1)
Sodium: 134 mmol/L — ABNORMAL LOW (ref 135–145)
Sodium: 137 mmol/L (ref 135–145)

## 2017-03-19 LAB — GLUCOSE, CAPILLARY
GLUCOSE-CAPILLARY: 130 mg/dL — AB (ref 65–99)
GLUCOSE-CAPILLARY: 150 mg/dL — AB (ref 65–99)
Glucose-Capillary: 230 mg/dL — ABNORMAL HIGH (ref 65–99)
Glucose-Capillary: 127 mg/dL — ABNORMAL HIGH (ref 65–99)
Glucose-Capillary: 153 mg/dL — ABNORMAL HIGH (ref 65–99)

## 2017-03-19 LAB — BLOOD GAS, ARTERIAL
Acid-base deficit: 14.3 mmol/L — ABNORMAL HIGH (ref 0.0–2.0)
BICARBONATE: 15.9 mmol/L — AB (ref 20.0–28.0)
FIO2: 28
O2 Saturation: 83.6 %
PH ART: 7.06 — AB (ref 7.350–7.450)
Patient temperature: 37
pCO2 arterial: 56 mmHg — ABNORMAL HIGH (ref 32.0–48.0)
pO2, Arterial: 69 mmHg — ABNORMAL LOW (ref 83.0–108.0)

## 2017-03-19 LAB — PROCALCITONIN: PROCALCITONIN: 0.37 ng/mL

## 2017-03-19 LAB — PHOSPHORUS
PHOSPHORUS: 7.1 mg/dL — AB (ref 2.5–4.6)
Phosphorus: 7.3 mg/dL — ABNORMAL HIGH (ref 2.5–4.6)

## 2017-03-19 LAB — POTASSIUM: Potassium: 5.2 mmol/L — ABNORMAL HIGH (ref 3.5–5.1)

## 2017-03-19 LAB — LACTIC ACID, PLASMA
Lactic Acid, Venous: 1.8 mmol/L (ref 0.5–1.9)
Lactic Acid, Venous: 1 mmol/L (ref 0.5–1.9)

## 2017-03-19 LAB — PROTIME-INR
INR: 1.2
Prothrombin Time: 15.1 seconds (ref 11.4–15.2)

## 2017-03-19 LAB — TSH: TSH: 1.013 u[IU]/mL (ref 0.350–4.500)

## 2017-03-19 MED ORDER — SODIUM BICARBONATE 8.4 % IV SOLN
150.0000 meq | Freq: Once | INTRAVENOUS | Status: AC
  Administered 2017-03-19: 150 meq via INTRAVENOUS

## 2017-03-19 MED ORDER — NALOXONE HCL 0.4 MG/ML IJ SOLN
0.4000 mg | Freq: Once | INTRAMUSCULAR | Status: AC
  Administered 2017-03-19: 0.4 mg via INTRAVENOUS
  Filled 2017-03-19: qty 1

## 2017-03-19 MED ORDER — MAGNESIUM SULFATE 4 GM/100ML IV SOLN
4.0000 g | Freq: Once | INTRAVENOUS | Status: AC
  Administered 2017-03-19: 4 g via INTRAVENOUS
  Filled 2017-03-19: qty 100

## 2017-03-19 MED ORDER — SODIUM CHLORIDE 0.9 % IV SOLN
1.0000 g | INTRAVENOUS | Status: DC
  Administered 2017-03-19 – 2017-03-21 (×3): 1 g via INTRAVENOUS
  Filled 2017-03-19 (×5): qty 10

## 2017-03-19 MED ORDER — PATIROMER SORBITEX CALCIUM 8.4 G PO PACK
8.4000 g | PACK | Freq: Every day | ORAL | Status: DC
  Administered 2017-03-19: 8.4 g via ORAL
  Filled 2017-03-19 (×2): qty 4

## 2017-03-19 MED ORDER — LACTATED RINGERS IV SOLN
INTRAVENOUS | Status: DC
Start: 2017-03-19 — End: 2017-03-19
  Administered 2017-03-19: 11:00:00 via INTRAVENOUS

## 2017-03-19 MED ORDER — SODIUM CHLORIDE 0.9 % IV SOLN
500.0000 mg | INTRAVENOUS | Status: DC
  Administered 2017-03-19 – 2017-03-20 (×2): 500 mg via INTRAVENOUS
  Filled 2017-03-19 (×2): qty 500

## 2017-03-19 MED ORDER — SODIUM CHLORIDE 0.9 % IV SOLN
INTRAVENOUS | Status: DC
  Administered 2017-03-19 – 2017-03-21 (×4): via INTRAVENOUS

## 2017-03-19 MED ORDER — SODIUM CHLORIDE 0.9 % IV SOLN
1.0000 g | Freq: Once | INTRAVENOUS | Status: AC
  Administered 2017-03-19: 1 g via INTRAVENOUS
  Filled 2017-03-19: qty 10

## 2017-03-19 MED ORDER — DEXTROSE 50 % IV SOLN
50.0000 mL | Freq: Once | INTRAVENOUS | Status: AC
  Administered 2017-03-19: 50 mL via INTRAVENOUS
  Filled 2017-03-19: qty 50

## 2017-03-19 MED ORDER — INSULIN REGULAR HUMAN 100 UNIT/ML IJ SOLN
10.0000 [IU] | Freq: Once | INTRAMUSCULAR | Status: AC
  Administered 2017-03-19: 10 [IU] via INTRAVENOUS
  Filled 2017-03-19: qty 0.1

## 2017-03-19 MED ORDER — SODIUM BICARBONATE 8.4 % IV SOLN
INTRAVENOUS | Status: DC
  Filled 2017-03-19 (×3): qty 150

## 2017-03-19 MED ORDER — ORAL CARE MOUTH RINSE
15.0000 mL | Freq: Two times a day (BID) | OROMUCOSAL | Status: DC
  Administered 2017-03-19 – 2017-03-21 (×6): 15 mL via OROMUCOSAL

## 2017-03-19 NOTE — Progress Notes (Signed)
[facility]  ROUNDING NOTE   Subjective:  Patient well-known to [name]. We follow her in the office for underlying chronic kidney disease stage III. Patient presented with weakness and altered mental status. She had recent right rotator cuff surgery. She is on pain medications at home. Her baseline creatinine is 1.8. Creatinine was 4.13 upon admission and currently 3.89. She also has hyperkalemia with a serum potassium of 5.2.   Objective:  Vital signs in last 24 hours:  Temp:  [97.7 F (36.5 C)-98.5 F (36.9 C)] 98.5 F (36.9 C) (02/15 0549) Pulse Rate:  [72-87] 85 (02/15 0800) Resp:  [6-26] 10 (02/15 0800) BP: (67-126)/(48-82) 126/82 (02/15 0800) SpO2:  [89 %-100 %] 95 % (02/15 0800) Weight:  [74.4 kg (164 lb)-80.8 kg (178 lb 2.1 oz)] 80.8 kg (178 lb 2.1 oz) (02/14 2357)  Weight change:  Filed Weights   03/18/17 1651 03/18/17 2357  Weight: 74.4 kg (164 lb) 80.8 kg (178 lb 2.1 oz)    Intake/Output: I/O last 3 completed shifts: In: 1100 [IV Piggyback:1100] Out: 0    Intake/Output this shift:  No intake/output data recorded.  Physical Exam: General: No acute distress  Head: Normocephalic, atraumatic. Moist oral mucosal membranes  Eyes: Anicteric  Neck: Supple, trachea midline  Lungs:  Clear to auscultation, normal effort  Heart: S1S2 no rubs  Abdomen:  Soft, nontender, bowel sounds present  Extremities: 1+ peripheral edema.  Neurologic: letharigc but arousable  Skin: No lesions       Basic Metabolic Panel: Recent Labs  Lab 03/18/17 1702 03/19/17 0035 03/19/17 0522 03/19/17 0807  NA 134* 134* 137  --   K 4.9 5.4* 5.7* 5.2*  CL 101 109 113*  --   CO2 19* 16* 16*  --   GLUCOSE 191* 155* 192*  --   BUN 33* 33* 33*  --   CREATININE 4.13* 3.76* 3.89*  --   CALCIUM 8.8* 7.3* 8.2*  --   MG  --  1.2* 2.3  --   PHOS  --  7.3* 7.1*  --     Liver Function Tests: No results for input(s): AST, ALT, ALKPHOS, BILITOT, PROT, ALBUMIN in the last 168  hours. No results for input(s): LIPASE, AMYLASE in the last 168 hours. No results for input(s): AMMONIA in the last 168 hours.  CBC: Recent Labs  Lab 03/18/17 1702 03/19/17 0522  WBC 21.7* 19.9*  HGB 13.0 11.4*  HCT 39.9 34.8*  MCV 84.2 84.6  PLT 254 176    Cardiac Enzymes: No results for input(s): CKTOTAL, CKMB, CKMBINDEX, TROPONINI in the last 168 hours.  BNP: Invalid input(s): POCBNP  CBG: Recent Labs  Lab 03/16/17 1014 03/16/17 1450 03/18/17 1626 03/19/17 0003 03/19/17 0801  GLUCAP 75 109* 192* 130* 230*    Microbiology: Results for orders placed or performed during the hospital encounter of 03/18/17  Blood Culture (routine x 2)     Status: None (Preliminary result)   Collection Time: 03/18/17  8:08 PM  Result Value Ref Range Status   Specimen Description BLOOD BLOOD LEFT HAND  Final   Special Requests   Final    BOTTLES DRAWN AEROBIC AND ANAEROBIC Blood Culture adequate volume   Culture   Final    NO GROWTH < 12 HOURS Performed at [facility], [address] [place], [place] [address]    Report Status PENDING  Incomplete  Blood Culture (routine x 2)     Status: None (Preliminary result)   Collection Time: 03/18/17  8:08 PM  Result Value Ref Range Status   Specimen Description BLOOD LEFT ANTECUBITAL  Final   Special Requests   Final    BOTTLES DRAWN AEROBIC AND ANAEROBIC Blood Culture results may not be optimal due to an excessive volume of blood received in culture bottles   Culture   Final    NO GROWTH < 12 HOURS Performed at [facility], [address], [place], [place] [address]    Report Status PENDING  Incomplete  MRSA PCR Screening     Status: None   Collection Time: 03/18/17 11:50 PM  Result Value Ref Range Status   MRSA by PCR NEGATIVE NEGATIVE Final    Comment:        The GeneXpert MRSA Assay (FDA approved for NASAL specimens only), is one component of a comprehensive MRSA colonization surveillance program. It is  not intended to diagnose MRSA infection nor to guide or monitor treatment for MRSA infections. Performed at [facility], [address] [place],  [address]     Coagulation Studies: Recent Labs    03/19/17 0035  LABPROT 15.1  INR 1.20    Urinalysis: Recent Labs    03/18/17 2008  COLORURINE AMBER*  LABSPEC 1.014  PHURINE 5.0  GLUCOSEU NEGATIVE  HGBUR SMALL*  BILIRUBINUR NEGATIVE  KETONESUR NEGATIVE  PROTEINUR 30*  NITRITE NEGATIVE  LEUKOCYTESUR MODERATE*      Imaging: Ct Head Wo Contrast  Result Date: 03/18/2017 CLINICAL DATA:  Slurred speech and unsteady gait. Altered consciousness. EXAM: CT HEAD WITHOUT CONTRAST TECHNIQUE: Contiguous axial images were obtained from the base of the skull through the vertex without intravenous contrast. COMPARISON:  02/16/2015 FINDINGS: Brain: Cerebral atrophy which is age advanced. Right basal ganglia lacunar infarct is favored to be remote but new since 02/16/2015. Example image 15/series 2. There is vague periventricular white matter hypoattenuation, including on images 18 and 19/series 2. This is felt to be similar to on the prior. More diffuse low density in the periventricular white matter likely related to small vessel disease. No mass lesion, hemorrhage, hydrocephalus, acute infarct, intra-axial, or extra-axial fluid collection. Vascular: Bilateral vertebral artery atherosclerosis which is significantly age advanced. There is also right internal carotid atherosclerosis. Skull: Normal Sinuses/Orbits: Normal imaged portions of the orbits and globes. Clear paranasal sinuses and mastoid air cells. Other: None. IMPRESSION: 1.  No acute intracranial abnormality. 2. Cerebral atrophy with mild small vessel ischemic change. Areas of more focal hypoattenuation in the right frontal white matter are felt to be similar to the prior exam and likely related to remote ischemia. 3. Age advanced intracranial atherosclerosis. 4. Remote but  interval right basal ganglia lacunar infarct. Electronically Signed   By: Abigail Miyamoto M.D.   On: 03/18/2017 16:46   Dg Chest Portable 1 View  Result Date: 03/18/2017 CLINICAL DATA:  Weakness EXAM: PORTABLE CHEST 1 VIEW COMPARISON:  01/27/2017 FINDINGS: Cardiac shadow is stable in appearance. The overall inspiratory effort is poor with elevation the right hemidiaphragm. Additionally right basilar infiltrate is noted medially. No bony abnormality is noted. IMPRESSION: Right basilar infiltrate with hypoinflation Electronically Signed   By: Inez Catalina M.D.   On: 03/18/2017 18:35     Medications:   . azithromycin    . cefTRIAXone (ROCEPHIN) IVPB 1 gram/100 mL NS (Mini-Bag Plus)    . cefTRIAXone (ROCEPHIN)  IV    . lactated ringers 125 mL/hr at 03/19/17 1105   . aspirin EC  81 mg Oral Daily  . buPROPion  150  mg Oral Daily  . citalopram  40 mg Oral Daily  . ezetimibe  10 mg Oral QHS  . gabapentin  300 mg Oral BID  . heparin  5,000 Units Subcutaneous Q8H  . hydrocortisone sod succinate (SOLU-CORTEF) inj  50 mg Intravenous Q6H  . insulin aspart  0-5 Units Subcutaneous QHS  . insulin aspart  0-9 Units Subcutaneous TID WC  . levothyroxine  25 mcg Oral QAC breakfast  . mouth rinse  15 mL Mouth Rinse BID  . pantoprazole  40 mg Oral BID   acetaminophen **OR** acetaminophen, ALPRAZolam, hyoscyamine, ondansetron **OR** ondansetron (ZOFRAN) IV, tiZANidine, zolpidem  Assessment/ Plan:  47 y.o. female past medical history hypertension, diabetes mellitus for over 20 years, diabetic retinopathy, history of CVA, gastric ulcer, colonic polyps, hyperlipidemia, gastric bypass performed [facility], CVA with mild L sided weakness 2015, carotild artery bypass and depression    1.  Acute renal failure due to hypotension. 2.  Chronic kidney disease stage III baseline creatinine 1.8. 3.  Hyperkalemia. 4.  Hypotension.  Plan: Patient is very well-known to [name] from the office.  Her baseline  creatinine is 1.8.  Upon presentation here her creatinine was up to 4.13.  Potassium now 3.89.  We recommend discontinuation of lactated Ringer's as she has hyperkalemia.  We will obtain renal ultrasound to make sure there is no underlying in addition we will start the patient on [place] 8.4 g p.o. daily to treat the hyperkalemia.  Otherwise management as per pulmonary/critical care.   LOS: 1 Hilmar Moldovan 2/15/201911:27 AM

## 2017-03-19 NOTE — Progress Notes (Addendum)
Good day. Has progressively become more alert throughout the day. B/P and sedative medications held due to condition. Eating and drinking without issues. Urine output good per urethral catheter.Ultrasound and CT of abdomen show no acute issues with kidneys. Bilateral movement of extremities has shown more symmetry today. Denies pain including shoulder pain.

## 2017-03-19 NOTE — Care Management (Signed)
Home health list left at bedside. Patient was sleeping and I called her name/touched her hand and she did not wake up.  I have notified Mia Creek PA with Dr. [name] of patient's presentation to hospital s/p arthroscopic shoulder procedure on 2/12. PT/OT evaluation requested. RNCM team to follow. I understand from report that patient is from home with her 49 year old child that called ems for assistance. Oxygen appears to be acute.

## 2017-03-19 NOTE — Progress Notes (Signed)
*  PRELIMINARY RESULTS* Echocardiogram 2D Echocardiogram has been performed.  [name] 03/19/2017, 10:22 AM

## 2017-03-19 NOTE — Progress Notes (Signed)
Pharmacy Antibiotic Note  [name] [name] is a 47 y.o. female admitted on 03/18/2017 with pneumonia.  Pharmacy has been consulted for azithromycin/ceftriaxone dosing.  Plan: Continue azithromycin 500mg  IV Q24hr and ceftriaxone 1g IV Q24hr.   No further dosing adjustments warranted for these medications. Please reconsult if further pharmacy involvement warranted.   Height: 5\' 2"  (157.5 cm) Weight: 178 lb 2.1 oz (80.8 kg) IBW/kg (Calculated) : 50.1  Temp (24hrs), Avg:98.2 F (36.8 C), Min:97.7 F (36.5 C), Max:98.5 F (36.9 C)  Recent Labs  Lab 03/18/17 1702 03/18/17 2008 03/19/17 0026 03/19/17 0035 03/19/17 0522 03/19/17 0807  WBC 21.7*  --   --   --  19.9*  --   CREATININE 4.13*  --   --  3.76* 3.89*  --   LATICACIDVEN  --  2.3* 1.8  --   --  1.0    Estimated Creatinine Clearance: 17.4 mL/min (A) (by C-G formula based on SCr of 3.89 mg/dL (H)).    Allergies  Allergen Reactions  . Tramadol     Mouth feels like it's on fire    Thank you for allowing pharmacy to be a part of this patient's care.  MLS 03/19/2017

## 2017-03-19 NOTE — Progress Notes (Addendum)
[name] at [place] NAME: [name]    MR#:  [id number]  DATE OF BIRTH:  1970-12-05  SUBJECTIVE:  CHIEF COMPLAINT:   Chief Complaint  Patient presents with  . Weakness  . Aphasia   Better shortness of breath, generalized weakness. REVIEW OF SYSTEMS:  Review of Systems  Constitutional: Positive for malaise/fatigue. Negative for chills and fever.  HENT: Negative for sore throat.   Eyes: Negative for blurred vision and double vision.  Respiratory: Positive for cough and shortness of breath. Negative for hemoptysis, wheezing and stridor.   Cardiovascular: Negative for chest pain, palpitations, orthopnea and leg swelling.  Gastrointestinal: Negative for abdominal pain, blood in stool, diarrhea, melena, nausea and vomiting.  Genitourinary: Negative for dysuria, flank pain and hematuria.  Musculoskeletal: Negative for back pain and joint pain.  Skin: Negative for rash.  Neurological: Positive for weakness. Negative for dizziness, sensory change, focal weakness, seizures, loss of consciousness and headaches.  Endo/Heme/Allergies: Negative for polydipsia.  Psychiatric/Behavioral: Negative for depression. The patient is not nervous/anxious.     DRUG ALLERGIES:   Allergies  Allergen Reactions  . Tramadol     Mouth feels like it's on fire   VITALS:  Blood pressure 100/61, pulse 78, temperature 97.7 F (36.5 C), resp. rate 18, height 5\' 2"  (1.575 m), weight 178 lb 2.1 oz (80.8 kg), SpO2 98 %. PHYSICAL EXAMINATION:  Physical Exam  Constitutional: She is oriented to person, place, and time and well-developed, well-nourished, and in no distress.  HENT:  Head: Normocephalic.  Mouth/Throat: Oropharynx is clear and moist.  Eyes: Conjunctivae and EOM are normal. Pupils are equal, round, and reactive to light. No scleral icterus.  Neck: Normal range of motion. Neck supple. No JVD present. No tracheal deviation present.  Cardiovascular:  Normal rate, regular rhythm and normal heart sounds. Exam reveals no gallop.  No murmur heard. Pulmonary/Chest: Effort normal and breath sounds normal. No respiratory distress. She has no wheezes. She has no rales.  Abdominal: Soft. Bowel sounds are normal. She exhibits no distension. There is no tenderness. There is no rebound.  Musculoskeletal: Normal range of motion. She exhibits no edema or tenderness.  Neurological: She is alert and oriented to person, place, and time. No cranial nerve deficit.  Skin: No rash noted. No erythema.  Psychiatric: Affect normal.   LABORATORY PANEL:  Female CBC Recent Labs  Lab 03/19/17 0522  WBC 19.9*  HGB 11.4*  HCT 34.8*  PLT 176   ------------------------------------------------------------------------------------------------------------------ Chemistries  Recent Labs  Lab 03/19/17 0522 03/19/17 0807  NA 137  --   K 5.7* 5.2*  CL 113*  --   CO2 16*  --   GLUCOSE 192*  --   BUN 33*  --   CREATININE 3.89*  --   CALCIUM 8.2*  --   MG 2.3  --    RADIOLOGY:  Ct Head Wo Contrast  Result Date: 03/18/2017 CLINICAL DATA:  Slurred speech and unsteady gait. Altered consciousness. EXAM: CT HEAD WITHOUT CONTRAST TECHNIQUE: Contiguous axial images were obtained from the base of the skull through the vertex without intravenous contrast. COMPARISON:  02/16/2015 FINDINGS: Brain: Cerebral atrophy which is age advanced. Right basal ganglia lacunar infarct is favored to be remote but new since 02/16/2015. Example image 15/series 2. There is vague periventricular white matter hypoattenuation, including on images 18 and 19/series 2. This is felt to be similar to on the prior. More diffuse low density in the periventricular white  matter likely related to small vessel disease. No mass lesion, hemorrhage, hydrocephalus, acute infarct, intra-axial, or extra-axial fluid collection. Vascular: Bilateral vertebral artery atherosclerosis which is significantly age advanced.  There is also right internal carotid atherosclerosis. Skull: Normal Sinuses/Orbits: Normal imaged portions of the orbits and globes. Clear paranasal sinuses and mastoid air cells. Other: None. IMPRESSION: 1.  No acute intracranial abnormality. 2. Cerebral atrophy with mild small vessel ischemic change. Areas of more focal hypoattenuation in the right frontal white matter are felt to be similar to the prior exam and likely related to remote ischemia. 3. Age advanced intracranial atherosclerosis. 4. Remote but interval right basal ganglia lacunar infarct. Electronically Signed   By: Abigail Miyamoto M.D.   On: 03/18/2017 16:46   Dg Chest Portable 1 View  Result Date: 03/18/2017 CLINICAL DATA:  Weakness EXAM: PORTABLE CHEST 1 VIEW COMPARISON:  01/27/2017 FINDINGS: Cardiac shadow is stable in appearance. The overall inspiratory effort is poor with elevation the right hemidiaphragm. Additionally right basilar infiltrate is noted medially. No bony abnormality is noted. IMPRESSION: Right basilar infiltrate with hypoinflation Electronically Signed   By: Inez Catalina M.D.   On: 03/18/2017 18:35   ASSESSMENT AND PLAN:   47 year old female with past medical history of coronary artery disease status post stent placement, history of previous MI, bipolar disorder, hypertension, chronic pain, diabetes, hypothyroidism, GERD who presented to the hospital due to altered mental status, lethargy and noted to be in acute on chronic renal failure and suspected to have a pneumonia.  1. Altered mental status-this is metabolic encephalopathy, related to high-dose narcotics, acute on chronic renal failure, high doses of Neurontin and also underlying pneumonia. hold patient's morphine, lower her dose of Neurontin, treat underlying pneumonia with IV ceftriaxone, Zithromax, continue IV fluids for acute kidney injury and follow her mental status. CT head is negative for acute pathology. Her mental status improved.   2. Acute on  chronic renal failure-secondary to dehydration and poor by mouth intake. -Patient baseline creatinine is around 1.7 to 2  Continue IVNS, follow BUN and creatinine urine output. Hold losartan, Metformin.   Follow-up kidney ultrasound.  Hyperkalemia due to above.  Start Veltassa per Dr. Holley Raring.  3. Pneumonia-this is suspected given the patient's chest x-ray findings, leukocytosis. Continue IV ceftriaxone, Zithromax. Follow cultures.  4. Leukocytosis-secondary to the pneumonia. -Follow white cell count after treatment with IV antibiotics.  5. Diabetes type 2 without complication-hold metformin, Place on sliding scale insulin.  6. Essential hypertension-hold Cardizem, Toprol, propranolol due to hypotension.  Hypotension.  Due to above.  Continue normal saline IV.  7. Chronic pain-hold patient's morphine given her altered mental status/encephalopathy.  8. GERD-continue Protonix.  9. Hypothyroidism-continue Synthroid.  10. Anxiety/depression-continue Xanax, Wellbutrin, Celexa.  All the records are reviewed and case discussed with Care Management/Social Worker. Management plans discussed with the patient, ex-husband and they are in agreement.  CODE STATUS: Full Code  TOTAL TIME TAKING CARE OF THIS PATIENT: 38 minutes.   More than 50% of the time was spent in counseling/coordination of care: YES  POSSIBLE D/C IN 3 DAYS, DEPENDING ON CLINICAL CONDITION.   Demetrios Loll M.D on 03/19/2017 at 3:56 PM  Between 7am to 6pm - Pager - [phone]  After 6pm go to [email or web address] - [facility]

## 2017-03-19 NOTE — Progress Notes (Signed)
Inpatient Diabetes Program Recommendations  AACE/ADA: New Consensus Statement on Inpatient Glycemic Control (2015)  Target Ranges:  Prepandial:   less than 140 mg/dL      Peak postprandial:   less than 180 mg/dL (1-2 hours)      Critically ill patients:  140 - 180 mg/dL   Lab Results  Component Value Date   GLUCAP 230 (H) 03/19/2017   HGBA1C 5.7 03/18/2017    Review of Glycemic Control  Results for [facility] MAY[facility], [name] (MRN [id number]) as of 03/19/2017 09:25  Ref. Range 03/18/2017 16:26 03/19/2017 00:03 03/19/2017 08:01  Glucose-Capillary Latest Ref Range: 65 - 99 mg/dL 192 (H) 130 (H) 230 (H)     Diabetes history: Type 2 Outpatient Diabetes medications: Metformin 500mg   q day  Current orders for Inpatient glycemic control: Novolog 0-9 units tid, Novolog 0-5 units qhs    * Solu-cortef 50mg  q6h  Inpatient Diabetes Program Recommendations: Consider adding low dose basal insulin while patient is on steroids. Reluctant to increase sliding scale because of her poor renal function.  Consider Lantus 9 units qhs (0.1unit/kg).  Gentry Fitz, RN, BA, MHA, CDE Diabetes Coordinator Inpatient Diabetes Program  [phone] (Team Pager) [phone] ([place]) 03/19/2017 11:09 AM

## 2017-03-19 NOTE — Progress Notes (Signed)
eLink Physician-Brief Progress Note Patient Name: [name] DOB: December 21, 1970 MRN: [id number]   Date of Service  03/19/2017  HPI/Events of Note  Adm with AMS, AKI - UTI vs pneumonia, note recent pan sens E coli UTI onmultiple sedating meds -narcotics, xanax, neurontin  ? Prolonged effect with AKI  eICU Interventions  Monitor Received 4L fluid - low threshold to add pressors if hypotensive, lactate 2.3     Intervention Category Evaluation Type: New Patient Evaluation  Desarai Barrack V. Laree Garron 03/19/2017, 12:14 AM

## 2017-03-19 NOTE — Consult Note (Signed)
PULMONARY / CRITICAL CARE MEDICINE   Name: [name] MRN: [id number] DOB: 09-Nov-1970    ADMISSION DATE:  03/18/2017   CONSULTATION DATE:  03/19/2017  REFERRING MD:  Dr Duane Boston  REASON: Altered mental status   HISTORY OF PRESENT ILLNESS:   This is a 47 y/o female with a PMH as indicated below who presented to the ED with complaints of slurred speech, confusion and unsteady gait.  Symptoms started this morning patient went to see her PCP.  She was evaluated and discharged home.  Symptoms R got worse hence patient's son decided to bring her to the emergency room.  Of note, patient recently had right shoulder surgery and her last pain pill was at 2100.  At the ED, patient became more and more somnolent and hypoxic.  She was given 4 mg of morphine for pain but then became more somnolent to the point where she was given Narcan.  Her mentation improved mildly.  She is still requiring oxygen and remains hypotensive with mean arterial blood pressures in the low 50s and 60s.  She is arousable.  Reports right shoulder pain but denies chest pain palpitations nausea and vomiting.  ED workup showed cytosis, mild lactic acidosis, worsening kidney function and her chest x-ray showed right lower lobe opacity suggestive of pneumonia.  She is being admitted to the ICU for further management. Patient recently had impingement/tendinopathy of the right shoulder with partial-thickness rotator cuff tear  PAST MEDICAL HISTORY :  She  has a past medical history of Anemia, Aortic arch aneurysm ([place]), Bipolar disorder ([place]), BRCA negative (2014), Colon polyp, Family history of breast cancer, Gastric ulcer (04/27/2011), Headache, Malignant melanoma of skin of scalp ([place]), MI, acute, non ST segment elevation ([place]), Morbid obesity ([place]), Myocardial infarction ([place]) (06/03/2015), Neuromuscular disorder ([place]), and S/P drug eluting coronary stent placement (06/04/2015).  PAST SURGICAL HISTORY: She  has a past surgical  history that includes Appendectomy; Right oophorectomy; Dilation and curettage of uterus; Gastric bypass (09/2009); Esophagogastroduodenoscopy (egd) with propofol (N/A, 09/14/2014); Trigger finger release (Right); Melanoma excision (2016); Left Carotid to sublcavian artery bypass w/ subclavian artery ligation; Cesarean section (2001); Novasure ablation (2002); Shoulder arthroscopy with open rotator cuff repair (Right, 01/07/2016); Colonoscopy with propofol (N/A, 04/27/2016); Esophagogastroduodenoscopy (egd) with propofol (N/A, 04/27/2016); Cardiac catheterization (N/A, 11/09/2014); Cardiac catheterization (N/A, 11/12/2014); Cardiac catheterization (N/A, 04/18/2015); Cardiac catheterization (Left, 06/04/2015); Cardiac catheterization (N/A, 06/04/2015); Coronary angioplasty; Cholecystectomy (N/A, 11/18/2016); and Shoulder arthroscopy with open rotator cuff repair (Right, 03/16/2017).  Allergies  Allergen Reactions  . Tramadol     Mouth feels like it's on fire    No current facility-administered medications on file prior to encounter.    Current Outpatient Medications on File Prior to Encounter  Medication Sig  . ALPRAZolam (XANAX) 0.5 MG tablet Take 0.5-1 tablets (0.25-0.5 mg total) by mouth every 6 (six) hours as needed for anxiety. (Patient taking differently: Take 0.5-1 mg by mouth every 6 (six) hours as needed for anxiety. )  . aspirin EC 81 MG tablet Take 1 tablet (81 mg total) by mouth daily.  . Blood Glucose Monitoring Suppl (ONE TOUCH ULTRA 2) w/Device KIT Use to check blood sugar once a day. Dx. E11.9  . buPROPion (WELLBUTRIN XL) 150 MG 24 hr tablet Take 1 tablet (150 mg total) by mouth daily.  . citalopram (CELEXA) 40 MG tablet Take 1 tablet (40 mg total) by mouth daily.  Marland Kitchen diltiazem (CARDIZEM CD) 120 MG 24 hr capsule Take 1 capsule (120 mg total) by  mouth daily.  Marland Kitchen ezetimibe (ZETIA) 10 MG tablet Take 1 tablet (10 mg total) daily by mouth. (Patient taking differently: Take 10 mg by mouth at bedtime. )   . gabapentin (NEURONTIN) 600 MG tablet Take 1-2 tablets (600-1,200 mg total) by mouth 2 (two) times daily. Take 1200 mg in the morning and 600 mg in the evening.  . hyoscyamine (LEVSIN SL) 0.125 MG SL tablet Place 1 tablet (0.125 mg total) under the tongue every 6 (six) hours as needed. (Patient taking differently: Place 0.125 mg under the tongue every 6 (six) hours as needed for cramping. )  . levothyroxine (SYNTHROID, LEVOTHROID) 25 MCG tablet Take 1 tablet (25 mcg total) daily before breakfast by mouth.  . losartan (COZAAR) 100 MG tablet Take 100 mg by mouth daily.  . metFORMIN (GLUCOPHAGE) 1000 MG tablet TAKE ONE TABLET BY MOUTH ONCE A DAY DAILY (Patient taking differently: Take 500 mg by mouth daily. TAKE ONE TABLET BY MOUTH ONCE A DAY DAILY)  . metoprolol succinate (TOPROL-XL) 25 MG 24 hr tablet Take 1 tablet (25 mg total) by mouth daily.  Marland Kitchen morphine (MSIR) 15 MG tablet Take 1 tablet (15 mg total) by mouth every 6 (six) hours as needed for moderate pain or severe pain.  . nitroGLYCERIN (NITROSTAT) 0.4 MG SL tablet Place 1 tablet (0.4 mg total) under the tongue every 5 (five) minutes as needed for chest pain.  . pantoprazole (PROTONIX) 40 MG tablet Take 1 tablet (40 mg total) by mouth 2 (two) times daily.  . propranolol ER (INDERAL LA) 60 MG 24 hr capsule Take 60 mg by mouth at bedtime.  Marland Kitchen tiZANidine (ZANAFLEX) 4 MG tablet Take 4 mg by mouth 2 (two) times daily as needed for muscle spasms.   Marland Kitchen zolpidem (AMBIEN) 5 MG tablet Take 1-2 tablets (5-10 mg total) by mouth at bedtime as needed for sleep.    FAMILY HISTORY:  Her indicated that her mother is alive. She indicated that her father is alive. She indicated that both of her sisters are alive. She indicated that the status of her neg hx is unknown. She indicated that the status of her other is unknown.   SOCIAL HISTORY: She  reports that she quit smoking about 22 years ago. Her smoking use included cigarettes. she has never used smokeless  tobacco. She reports that she does not drink alcohol or use drugs.  REVIEW OF SYSTEMS:   Constitutional: Negative for fever and chills.  HENT: Negative for congestion and rhinorrhea.  Eyes: Negative for redness and visual disturbance.  Respiratory: Negative for shortness of breath and wheezing.  Cardiovascular: Negative for chest pain and palpitations.  Gastrointestinal: Negative  for nausea , vomiting and abdominal pain and  Loose stools Genitourinary: Negative for dysuria and urgency.  Endocrine: Denies polyuria, polyphagia and heat intolerance Musculoskeletal: Positive for right shoulder pain Skin: Negative for pallor and wound.  Neurological: Negative for dizziness and headaches   SUBJECTIVE:   VITAL SIGNS: BP (!) 80/62 (BP Location: Left Arm)   Pulse 84   Temp 98.3 F (36.8 C) (Oral)   Resp (!) 26   Ht '5\' 2"'  (1.575 m)   Wt 178 lb 2.1 oz (80.8 kg)   SpO2 92%   BMI 32.58 kg/m   HEMODYNAMICS:    VENTILATOR SETTINGS:    INTAKE / OUTPUT: I/O last 3 completed shifts: In: 1000 [IV Piggyback:1000] Out: -   PHYSICAL EXAMINATION: General: Somnolent, no acute distress Neuro: Awakens to voice touch and noxious stimulus, alert  and oriented x3, speech is normal, follows basic commands, moves all extremities HEENT: PERRLA, trachea midline Cardiovascular: Pulse regular, S1-S2, no murmur regurg or gallop, +2 pulses bilaterally, no edema Lungs: Bilateral breath sounds, diminished in the bases  abdomen: Normal bowel sounds, palpation reveals no organomegaly Musculoskeletal: Needed range of motion in right shoulder with immobilizer in place, no other deformities Skin: Warm and dry  LABS:  BMET Recent Labs  Lab 03/18/17 1702  NA 134*  K 4.9  CL 101  CO2 19*  BUN 33*  CREATININE 4.13*  GLUCOSE 191*    Electrolytes Recent Labs  Lab 03/18/17 1702  CALCIUM 8.8*    CBC Recent Labs  Lab 03/18/17 1702  WBC 21.7*  HGB 13.0  HCT 39.9  PLT 254    Coag's No  results for input(s): APTT, INR in the last 168 hours.  Sepsis Markers Recent Labs  Lab 03/18/17 2008  LATICACIDVEN 2.3*    ABG No results for input(s): PHART, PCO2ART, PO2ART in the last 168 hours.  Liver Enzymes No results for input(s): AST, ALT, ALKPHOS, BILITOT, ALBUMIN in the last 168 hours.  Cardiac Enzymes No results for input(s): TROPONINI, PROBNP in the last 168 hours.  Glucose Recent Labs  Lab 03/16/17 1014 03/16/17 1450 03/18/17 1626 03/19/17 0003  GLUCAP 75 109* 192* 130*    Imaging Ct Head Wo Contrast  Result Date: 03/18/2017 CLINICAL DATA:  Slurred speech and unsteady gait. Altered consciousness. EXAM: CT HEAD WITHOUT CONTRAST TECHNIQUE: Contiguous axial images were obtained from the base of the skull through the vertex without intravenous contrast. COMPARISON:  02/16/2015 FINDINGS: Brain: Cerebral atrophy which is age advanced. Right basal ganglia lacunar infarct is favored to be remote but new since 02/16/2015. Example image 15/series 2. There is vague periventricular white matter hypoattenuation, including on images 18 and 19/series 2. This is felt to be similar to on the prior. More diffuse low density in the periventricular white matter likely related to small vessel disease. No mass lesion, hemorrhage, hydrocephalus, acute infarct, intra-axial, or extra-axial fluid collection. Vascular: Bilateral vertebral artery atherosclerosis which is significantly age advanced. There is also right internal carotid atherosclerosis. Skull: Normal Sinuses/Orbits: Normal imaged portions of the orbits and globes. Clear paranasal sinuses and mastoid air cells. Other: None. IMPRESSION: 1.  No acute intracranial abnormality. 2. Cerebral atrophy with mild small vessel ischemic change. Areas of more focal hypoattenuation in the right frontal white matter are felt to be similar to the prior exam and likely related to remote ischemia. 3. Age advanced intracranial atherosclerosis. 4. Remote  but interval right basal ganglia lacunar infarct. Electronically Signed   By: Abigail Miyamoto M.D.   On: 03/18/2017 16:46   Dg Chest Portable 1 View  Result Date: 03/18/2017 CLINICAL DATA:  Weakness EXAM: PORTABLE CHEST 1 VIEW COMPARISON:  01/27/2017 FINDINGS: Cardiac shadow is stable in appearance. The overall inspiratory effort is poor with elevation the right hemidiaphragm. Additionally right basilar infiltrate is noted medially. No bony abnormality is noted. IMPRESSION: Right basilar infiltrate with hypoinflation Electronically Signed   By: Inez Catalina M.D.   On: 03/18/2017 18:35    STUDIES:  None  CULTURES: Blood cultures x2 Urine culture  ANTIBIOTICS: Azithromycin Ceftriaxone  SIGNIFICANT EVENTS: 02/12> right shoulder surgery 02/14> admitted  LINES/TUBES: Peripheral IVs  DISCUSSION: 47 year old female with recent right shoulder surgery presenting with community-acquired pneumonia, acute hypoxic respiratory failure, acute encephalopathy secondary to opiate use, uremia, and sepsis secondary to pneumonia  ASSESSMENT Sepsis secondary to pneumonia and possible  UTI Urinary tract infection CAP-Right lower lobe pneumonia Septic shock Acute hypoxic respiratory failure secondary to sepsis Acute on chronic renal failure-creatinine trending up Hyperkalemia Hypothyroidism Right shoulder Impingement/tendinopathy with partial-thickness rotator cuff tear status post surgical repair History of CAD, bipolar, and anemia   PLAN Hemodynamics per ICU protocol Broad-spectrum antibiotics Nephrology consult IV fluids and pressors to maintain mean arterial blood pressure greater than 65 Treat hyperkalemia with IV dextrose and insulin Narcan x1 Hold all sedating medications Supplemental oxygen as needed to maintain SPO2 greater than 90% Follow-up cultures Trend pro-calcitonin and adjust antibiotics Check TSH with morning labs Continue Synthroid GI and DVT prophylaxis No changes in  treatment plan pending clinical course and diagnostics FAMILY  - Updates: Updated on current treatment plan.  No family at bedside.  Will update when available  - Inter-disciplinary family meet or Palliative Care meeting due by:  day [place]. [facility] ANP-BC Pulmonary and Critical Care Medicine [facility] Pager [phone] or [phone]  NB: This document was prepared using Dragon voice recognition software and may include unintentional dictation errors.    03/19/2017, 12:33 AM

## 2017-03-20 DIAGNOSIS — E875 Hyperkalemia: Secondary | ICD-10-CM

## 2017-03-20 LAB — URINE CULTURE: Culture: NO GROWTH

## 2017-03-20 LAB — CBC WITH DIFFERENTIAL/PLATELET
BASOS PCT: 1 %
Basophils Absolute: 0.1 10*3/uL (ref 0–0.1)
EOS ABS: 0.1 10*3/uL (ref 0–0.7)
Eosinophils Relative: 0 %
HCT: 32.2 % — ABNORMAL LOW (ref 35.0–47.0)
Hemoglobin: 10.5 g/dL — ABNORMAL LOW (ref 12.0–16.0)
LYMPHS ABS: 0.9 10*3/uL — AB (ref 1.0–3.6)
Lymphocytes Relative: 6 %
MCH: 27.3 pg (ref 26.0–34.0)
MCHC: 32.6 g/dL (ref 32.0–36.0)
MCV: 83.7 fL (ref 80.0–100.0)
MONO ABS: 0.4 10*3/uL (ref 0.2–0.9)
MONOS PCT: 3 %
Neutro Abs: 14.5 10*3/uL — ABNORMAL HIGH (ref 1.4–6.5)
Neutrophils Relative %: 90 %
Platelets: 185 10*3/uL (ref 150–440)
RBC: 3.84 MIL/uL (ref 3.80–5.20)
RDW: 13.3 % (ref 11.5–14.5)
WBC: 16 10*3/uL — ABNORMAL HIGH (ref 3.6–11.0)

## 2017-03-20 LAB — RENAL FUNCTION PANEL
Albumin: 3 g/dL — ABNORMAL LOW (ref 3.5–5.0)
Anion gap: 11 (ref 5–15)
BUN: 40 mg/dL — AB (ref 6–20)
CO2: 18 mmol/L — AB (ref 22–32)
Calcium: 8.3 mg/dL — ABNORMAL LOW (ref 8.9–10.3)
Chloride: 107 mmol/L (ref 101–111)
Creatinine, Ser: 4.09 mg/dL — ABNORMAL HIGH (ref 0.44–1.00)
GFR calc Af Amer: 14 mL/min — ABNORMAL LOW (ref >60)
GFR calc non Af Amer: 12 mL/min — ABNORMAL LOW (ref >60)
GLUCOSE: 159 mg/dL — AB (ref 65–99)
PHOSPHORUS: 6.8 mg/dL — AB (ref 2.5–4.6)
POTASSIUM: 5.9 mmol/L — AB (ref 3.5–5.1)
SODIUM: 136 mmol/L (ref 135–145)

## 2017-03-20 LAB — GLUCOSE, CAPILLARY
GLUCOSE-CAPILLARY: 122 mg/dL — AB (ref 65–99)
GLUCOSE-CAPILLARY: 177 mg/dL — AB (ref 65–99)
Glucose-Capillary: 106 mg/dL — ABNORMAL HIGH (ref 65–99)
Glucose-Capillary: 140 mg/dL — ABNORMAL HIGH (ref 65–99)
Glucose-Capillary: 141 mg/dL — ABNORMAL HIGH (ref 65–99)
Glucose-Capillary: 151 mg/dL — ABNORMAL HIGH (ref 65–99)
Glucose-Capillary: 163 mg/dL — ABNORMAL HIGH (ref 65–99)

## 2017-03-20 LAB — POTASSIUM: POTASSIUM: 5.8 mmol/L — AB (ref 3.5–5.1)

## 2017-03-20 LAB — PROCALCITONIN: Procalcitonin: 0.36 ng/mL

## 2017-03-20 MED ORDER — DEXTROSE 250 MG/ML IV SOLN
25.0000 g | Freq: Once | INTRAVENOUS | Status: DC
  Filled 2017-03-20: qty 100

## 2017-03-20 MED ORDER — PATIROMER SORBITEX CALCIUM 8.4 G PO PACK
8.4000 g | PACK | Freq: Every day | ORAL | Status: DC
  Administered 2017-03-20 – 2017-03-21 (×2): 8.4 g via ORAL
  Filled 2017-03-20 (×5): qty 4

## 2017-03-20 MED ORDER — SODIUM CHLORIDE 0.9 % IV SOLN
1.0000 g | Freq: Once | INTRAVENOUS | Status: AC
  Administered 2017-03-20: 1 g via INTRAVENOUS
  Filled 2017-03-20: qty 10

## 2017-03-20 MED ORDER — HYDROCORTISONE NA SUCCINATE PF 100 MG IJ SOLR: 50.0000 mg | Freq: Two times a day (BID) | INTRAMUSCULAR | Status: DC

## 2017-03-20 MED ORDER — ALPRAZOLAM 0.25 MG PO TABS: 0.2500 mg | ORAL_TABLET | Freq: Every evening | ORAL | Status: DC | PRN

## 2017-03-20 MED ORDER — SODIUM POLYSTYRENE SULFONATE PO POWD
30.0000 g | Freq: Once | ORAL | Status: AC
  Administered 2017-03-20: 30 g via ORAL
  Filled 2017-03-20: qty 30

## 2017-03-20 MED ORDER — INSULIN ASPART 100 UNIT/ML IV SOLN
10.0000 [IU] | Freq: Once | INTRAVENOUS | Status: AC
  Administered 2017-03-20: 10 [IU] via INTRAVENOUS
  Filled 2017-03-20: qty 0.1

## 2017-03-20 MED ORDER — DEXTROSE 50 % IV SOLN
50.0000 mL | Freq: Once | INTRAVENOUS | Status: AC
  Administered 2017-03-20: 50 mL via INTRAVENOUS
  Filled 2017-03-20: qty 50

## 2017-03-20 MED ORDER — SODIUM POLYSTYRENE SULFONATE 15 GM/60ML PO SUSP: 30.0000 g | Freq: Once | ORAL | Status: DC

## 2017-03-20 NOTE — Progress Notes (Signed)
MD [place] notified that pt has not had a bowel movement after receiving morning dose of Kayexalate , MD ordering  [place] recheck.

## 2017-03-20 NOTE — Progress Notes (Signed)
OT Cancellation Note  Patient Details Name: [name] MRN: [id number] DOB: 04/14/1970   Cancelled Treatment:    Reason Eval/Treat Not Completed: Medical issues which prohibited therapy. Order received, chart reviewed. Pt contraindicated for therapy at this time due to critically elevated potassium levels (K+ 5.9). Will continue to follow acutely and re-attempt OT evaluation at later date/time as pt is medically appropriate for exertional activity.  Jeni Salles, MPH, MS, OTR/L ascom [phone] 03/20/17, 8:40 AM

## 2017-03-20 NOTE — Progress Notes (Signed)
[facility]  ROUNDING NOTE   Subjective:  Patient transition to floor care today.  Renal function is worse as creatinine is up to 4.09.  However urine output was 1.1 L over the preceding 24 hours. Patient is much more awake and alert today however.   Objective:  Vital signs in last 24 hours:  Temp:  [98 F (36.7 C)-98.5 F (36.9 C)] 98 F (36.7 C) (02/16 0742) Pulse Rate:  [69-84] 75 (02/16 0742) Resp:  [11-19] 18 (02/16 0742) BP: (102-150)/(36-80) 143/60 (02/16 0742) SpO2:  [92 %-97 %] 97 % (02/16 0742)  Weight change:  Filed Weights   03/18/17 1651 03/18/17 2357  Weight: 74.4 kg (164 lb) 80.8 kg (178 lb 2.1 oz)    Intake/Output: I/O last 3 completed shifts: In: 1918.8 [P.O.:250; I.V.:1218.8; IV Piggyback:450] Out: 1100 [Urine:1100]   Intake/Output this shift:  No intake/output data recorded.  Physical Exam: General: No acute distress  Head: Normocephalic, atraumatic. Moist oral mucosal membranes  Eyes: Anicteric  Neck: Supple, trachea midline  Lungs:  Clear to auscultation, normal effort  Heart: S1S2 no rubs  Abdomen:  Soft, nontender, bowel sounds present  Extremities: 1+ peripheral edema.  Neurologic: Awake, alert, follows commands  Skin: No lesions       Basic Metabolic Panel: Recent Labs  Lab 03/18/17 1702 03/19/17 0035 03/19/17 0522 03/19/17 0807 03/20/17 0444  NA 134* 134* 137  --  136  K 4.9 5.4* 5.7* 5.2* 5.9*  CL 101 109 113*  --  107  CO2 19* 16* 16*  --  18*  GLUCOSE 191* 155* 192*  --  159*  BUN 33* 33* 33*  --  40*  CREATININE 4.13* 3.76* 3.89*  --  4.09*  CALCIUM 8.8* 7.3* 8.2*  --  8.3*  MG  --  1.2* 2.3  --   --   PHOS  --  7.3* 7.1*  --  6.8*    Liver Function Tests: Recent Labs  Lab 03/20/17 0444  ALBUMIN 3.0*   No results for input(s): LIPASE, AMYLASE in the last 168 hours. No results for input(s): AMMONIA in the last 168 hours.  CBC: Recent Labs  Lab 03/18/17 1702 03/19/17 0522 03/20/17 0444  WBC  21.7* 19.9* 16.0*  NEUTROABS  --   --  14.5*  HGB 13.0 11.4* 10.5*  HCT 39.9 34.8* 32.2*  MCV 84.2 84.6 83.7  PLT 254 176 185    Cardiac Enzymes: No results for input(s): CKTOTAL, CKMB, CKMBINDEX, TROPONINI in the last 168 hours.  BNP: Invalid input(s): POCBNP  CBG: Recent Labs  Lab 03/19/17 2133 03/20/17 0008 03/20/17 0408 03/20/17 0755 03/20/17 1202  GLUCAP 150* 151* 140* 122* 163*    Microbiology: Results for orders placed or performed during the hospital encounter of 03/18/17  Blood Culture (routine x 2)     Status: None (Preliminary result)   Collection Time: 03/18/17  8:08 PM  Result Value Ref Range Status   Specimen Description BLOOD BLOOD LEFT HAND  Final   Special Requests   Final    BOTTLES DRAWN AEROBIC AND ANAEROBIC Blood Culture adequate volume   Culture   Final    NO GROWTH 2 DAYS Performed at [facility], [address] [place], [place] [address]    Report Status PENDING  Incomplete  Blood Culture (routine x 2)     Status: None (Preliminary result)   Collection Time: 03/18/17  8:08 PM  Result Value Ref Range Status   Specimen Description BLOOD LEFT  ANTECUBITAL  Final   Special Requests   Final    BOTTLES DRAWN AEROBIC AND ANAEROBIC Blood Culture results may not be optimal due to an excessive volume of blood received in culture bottles   Culture   Final    NO GROWTH 2 DAYS Performed at [facility], [address] [place], [place] [address]    Report Status PENDING  Incomplete  Urine culture     Status: None   Collection Time: 03/18/17  8:08 PM  Result Value Ref Range Status   Specimen Description   Final    URINE, RANDOM Performed at [facility], [address], [place], [place] [address]    Special Requests   Final    NONE Performed at [facility], [address] [place], [place] [address]    Culture   Final    NO GROWTH Performed at [facility], [place] [address], [place], [place]  [address]    Report Status 03/20/2017 FINAL  Final  MRSA PCR Screening     Status: None   Collection Time: 03/18/17 11:50 PM  Result Value Ref Range Status   MRSA by PCR NEGATIVE NEGATIVE Final    Comment:        The GeneXpert MRSA Assay (FDA approved for NASAL specimens only), is one component of a comprehensive MRSA colonization surveillance program. It is not intended to diagnose MRSA infection nor to guide or monitor treatment for MRSA infections. Performed at [facility], [address] [place],  [address]     Coagulation Studies: Recent Labs    03/19/17 0035  LABPROT 15.1  INR 1.20    Urinalysis: Recent Labs    03/18/17 2008  COLORURINE AMBER*  LABSPEC 1.014  PHURINE 5.0  GLUCOSEU NEGATIVE  HGBUR SMALL*  BILIRUBINUR NEGATIVE  KETONESUR NEGATIVE  PROTEINUR 30*  NITRITE NEGATIVE  LEUKOCYTESUR MODERATE*      Imaging: Ct Abdomen Pelvis Wo Contrast  Result Date: 03/19/2017 CLINICAL DATA:  Shoulder surgery 4 days ago. Unable to urinate. Hydronephrosis. EXAM: CT ABDOMEN AND PELVIS WITHOUT CONTRAST TECHNIQUE: Multidetector CT imaging of the abdomen and pelvis was performed following the standard protocol without IV contrast. COMPARISON:  CT abdomen and pelvis 01/27/2017 FINDINGS: Lower chest: Inferior medial lower lobe airspace disease is present bilaterally, right greater than left. There is scattered disease in the posterior lingula. The heart size is normal. Coronary artery calcifications are present. No significant pleural or pericardial effusion is present. Hepatobiliary: No focal liver abnormality is seen. Status post cholecystectomy. No biliary dilatation. Pancreas: Unremarkable. No pancreatic ductal dilatation or surrounding inflammatory changes. Spleen: Normal in size without focal abnormality. Adrenals/Urinary Tract: Adrenal glands are normal bilaterally. A punctate left lower pole kidney stone is present. There is no hydronephrosis. Ureters are  within normal limits. A Foley catheter is present within the urinary bladder. Stomach/Bowel: Gastric bypass surgery is noted. Stomach and duodenum are otherwise within normal limits. Small bowel is unremarkable. The terminal ileum is within normal limits. The appendix is not discretely visualized and may be surgically absent. The ascending and transverse colon are within normal limits. Descending and sigmoid colon are normal. Vascular/Lymphatic: Extensive vascular calcifications are present. No significant adenopathy is present. Reproductive: Uterus and bilateral adnexa are unremarkable. Other: No abdominal wall hernia or abnormality. No abdominopelvic ascites. Musculoskeletal: Subcutaneous edema is most prominent posteriorly. Calcified L4-5 disc is again seen. Vertebral body heights and alignment are maintained. Schmorl's nodes are evident. The bony pelvis is intact.  The hips are located and within normal limits bilaterally. IMPRESSION: 1. Stable punctate nonobstructing stone at the lower pole left kidney. 2. Foley catheter in place without urinary tract obstruction. New bilateral medial airspace disease, right greater than left. This is most concerning for pneumonia. Aspiration is also considered. Electronically Signed   By: San Morelle M.D.   On: 03/19/2017 16:29   Ct Head Wo Contrast  Result Date: 03/18/2017 CLINICAL DATA:  Slurred speech and unsteady gait. Altered consciousness. EXAM: CT HEAD WITHOUT CONTRAST TECHNIQUE: Contiguous axial images were obtained from the base of the skull through the vertex without intravenous contrast. COMPARISON:  02/16/2015 FINDINGS: Brain: Cerebral atrophy which is age advanced. Right basal ganglia lacunar infarct is favored to be remote but new since 02/16/2015. Example image 15/series 2. There is vague periventricular white matter hypoattenuation, including on images 18 and 19/series 2. This is felt to be similar to on the prior. More diffuse low density in the  periventricular white matter likely related to small vessel disease. No mass lesion, hemorrhage, hydrocephalus, acute infarct, intra-axial, or extra-axial fluid collection. Vascular: Bilateral vertebral artery atherosclerosis which is significantly age advanced. There is also right internal carotid atherosclerosis. Skull: Normal Sinuses/Orbits: Normal imaged portions of the orbits and globes. Clear paranasal sinuses and mastoid air cells. Other: None. IMPRESSION: 1.  No acute intracranial abnormality. 2. Cerebral atrophy with mild small vessel ischemic change. Areas of more focal hypoattenuation in the right frontal white matter are felt to be similar to the prior exam and likely related to remote ischemia. 3. Age advanced intracranial atherosclerosis. 4. Remote but interval right basal ganglia lacunar infarct. Electronically Signed   By: Abigail Miyamoto M.D.   On: 03/18/2017 16:46   US Renal  Result Date: 03/19/2017 CLINICAL DATA:  Acute renal failure EXAM: RENAL / URINARY TRACT ULTRASOUND COMPLETE COMPARISON:  03/19/2017 FINDINGS: Right Kidney: Length: 8.3 cm. Echogenicity within normal limits. No mass or hydronephrosis visualized. Left Kidney: Length: 9.4 cm. Echogenicity within normal limits. No mass or hydronephrosis visualized. Bladder: Decompressed by Foley catheter IMPRESSION: No acute renal abnormality is noted. The overall appearance is similar to that seen on recent CT examination. The punctate lower pole left renal calculus is not well visualized. Electronically Signed   By: Inez Catalina M.D.   On: 03/19/2017 16:57   Dg Chest Portable 1 View  Result Date: 03/18/2017 CLINICAL DATA:  Weakness EXAM: PORTABLE CHEST 1 VIEW COMPARISON:  01/27/2017 FINDINGS: Cardiac shadow is stable in appearance. The overall inspiratory effort is poor with elevation the right hemidiaphragm. Additionally right basilar infiltrate is noted medially. No bony abnormality is noted. IMPRESSION: Right basilar infiltrate with  hypoinflation Electronically Signed   By: Inez Catalina M.D.   On: 03/18/2017 18:35     Medications:   . sodium chloride 75 mL/hr at 03/20/17 0446  . azithromycin Stopped (03/19/17 1924)  . cefTRIAXone (ROCEPHIN)  IV Stopped (03/19/17 1831)   . aspirin EC  81 mg Oral Daily  . buPROPion  150 mg Oral Daily  . citalopram  40 mg Oral Daily  . ezetimibe  10 mg Oral QHS  . gabapentin  300 mg Oral BID  . heparin  5,000 Units Subcutaneous Q8H  . insulin aspart  0-5 Units Subcutaneous QHS  . insulin aspart  0-9 Units Subcutaneous TID WC  . levothyroxine  25 mcg Oral QAC breakfast  . mouth rinse  15 mL Mouth Rinse BID  . pantoprazole  40 mg Oral BID   acetaminophen **OR**  acetaminophen, ALPRAZolam, hyoscyamine, ondansetron **OR** ondansetron (ZOFRAN) IV  Assessment/ Plan:  47 y.o. female past medical history hypertension, diabetes mellitus for over 20 years, diabetic retinopathy, history of CVA, gastric ulcer, colonic polyps, hyperlipidemia, gastric bypass performed [facility], CVA with mild L sided weakness 2015, carotild artery bypass and depression    1.  Acute renal failure due to hypotension. 2.  Chronic kidney disease stage III baseline creatinine 1.8. 3.  Hyperkalemia. 4.  Hypotension.  Plan: Renal function unfortunately continues to deteriorate at the moment.  Creatinine up to 4.09 with a potassium of 5.9.  Urine output was 1.1 L over the preceding 24 hours.  We will go ahead and start the patient on Veltassa 8.4 g p.o. daily.  She already received Kayexalate earlier today.  No urgent indication for dialysis at the moment however we will need to monitor her progress closely.   LOS: 2 Lanee Chain 2/16/20193:44 PM

## 2017-03-20 NOTE — Progress Notes (Signed)
Patient ID: [name], female   DOB: 17-Mar-1970, 47 y.o.   MRN: [id number]  [place] KNL:[id number] DOB: 01/05/1971 DOA: 03/18/2017 PCP: [name], MD  HPI/Subjective: Patient received Xanax and Ambien last night and is lethargic this morning.  She answers a few yes or no questions but falls back asleep.  This morning's potassium was up at 5.9.  EKG does not show any peaked T waves.  Objective: Vitals:   03/20/17 0411 03/20/17 0742  BP: (!) 142/75 (!) 143/60  Pulse: 76 75  Resp: 18 18  Temp: 98.1 F (36.7 C) 98 F (36.7 C)  SpO2: 94% 97%    Filed Weights   03/18/17 1651 03/18/17 2357  Weight: 74.4 kg (164 lb) 80.8 kg (178 lb 2.1 oz)    ROS: Review of Systems  Unable to perform ROS: Acuity of condition  Respiratory: Negative for shortness of breath.   Cardiovascular: Negative for chest pain.  Gastrointestinal: Negative for abdominal pain.   Exam: Physical Exam  Constitutional: She appears lethargic.  HENT:  Nose: No mucosal edema.  Mouth/Throat: No oropharyngeal exudate or posterior oropharyngeal edema.  Eyes: Conjunctivae, EOM and lids are normal. Pupils are equal, round, and reactive to light.  Neck: No JVD present. Carotid bruit is not present. No edema present. No thyroid mass and no thyromegaly present.  Cardiovascular: S1 normal and S2 normal. Exam reveals no gallop.  No murmur heard. Pulses:      Dorsalis pedis pulses are 2+ on the right side, and 2+ on the left side.  Respiratory: No respiratory distress. She has no wheezes. She has no rhonchi. She has no rales.  GI: Soft. Bowel sounds are normal. There is no tenderness.  Musculoskeletal:       Right ankle: She exhibits no swelling.       Left ankle: She exhibits no swelling.  Lymphadenopathy:    She has no cervical adenopathy.  Neurological: She appears lethargic.  Skin: Skin is warm. No rash noted. Nails show no clubbing.  Psychiatric:  Lethargic.   Difficult to assess.      Data Reviewed: Basic Metabolic Panel: Recent Labs  Lab 03/18/17 1702 03/19/17 0035 03/19/17 0522 03/19/17 0807 03/20/17 0444  NA 134* 134* 137  --  136  K 4.9 5.4* 5.7* 5.2* 5.9*  CL 101 109 113*  --  107  CO2 19* 16* 16*  --  18*  GLUCOSE 191* 155* 192*  --  159*  BUN 33* 33* 33*  --  40*  CREATININE 4.13* 3.76* 3.89*  --  4.09*  CALCIUM 8.8* 7.3* 8.2*  --  8.3*  MG  --  1.2* 2.3  --   --   PHOS  --  7.3* 7.1*  --  6.8*   Liver Function Tests: Recent Labs  Lab 03/20/17 0444  ALBUMIN 3.0*   CBC: Recent Labs  Lab 03/18/17 1702 03/19/17 0522 03/20/17 0444  WBC 21.7* 19.9* 16.0*  NEUTROABS  --   --  14.5*  HGB 13.0 11.4* 10.5*  HCT 39.9 34.8* 32.2*  MCV 84.2 84.6 83.7  PLT 254 176 185    CBG: Recent Labs  Lab 03/19/17 1716 03/19/17 2133 03/20/17 0008 03/20/17 0408 03/20/17 0755  GLUCAP 153* 150* 151* 140* 122*    Recent Results (from the past 240 hour(s))  Urine Culture     Status: Abnormal   Collection Time: 03/11/17  2:36 PM  Result Value Ref Range  Status   Urine Culture, Routine Final report (A)  Final   Organism ID, Bacteria Escherichia coli (A)  Final    Comment: Greater than 100,000 colony forming units per mL Cefazolin <=4 ug/mL Cefazolin with an MIC <=16 predicts susceptibility to the oral agents cefaclor, cefdinir, cefpodoxime, cefprozil, cefuroxime, cephalexin, and loracarbef when used for therapy of uncomplicated urinary tract infections due to E. coli, Klebsiella pneumoniae, and Proteus mirabilis.    Antimicrobial Susceptibility Comment  Final    Comment:       ** S = Susceptible; I = Intermediate; R = Resistant **                    P = Positive; N = Negative             MICS are expressed in micrograms per mL    Antibiotic                 RSLT#1    RSLT#2    RSLT#3    RSLT#4 Amoxicillin/Clavulanic Acid    S Ampicillin                     S Cefepime                       S Ceftriaxone                     S Cefuroxime                     S Ciprofloxacin                  S Ertapenem                      S Gentamicin                     S Imipenem                       S Levofloxacin                   S Meropenem                      S Nitrofurantoin                 S Piperacillin/Tazobactam        S Tetracycline                   S Tobramycin                     S Trimethoprim/Sulfa             S   Blood Culture (routine x 2)     Status: None (Preliminary result)   Collection Time: 03/18/17  8:08 PM  Result Value Ref Range Status   Specimen Description BLOOD BLOOD LEFT HAND  Final   Special Requests   Final    BOTTLES DRAWN AEROBIC AND ANAEROBIC Blood Culture adequate volume   Culture   Final    NO GROWTH < 12 HOURS Performed at [facility], [address] [place], [place] [address]    Report Status PENDING  Incomplete  Blood Culture (routine x 2)     Status: None (Preliminary result)   Collection Time: 03/18/17  8:08 PM  Result Value Ref Range Status   Specimen Description BLOOD LEFT ANTECUBITAL  Final   Special Requests   Final    BOTTLES DRAWN AEROBIC AND ANAEROBIC Blood Culture results may not be optimal due to an excessive volume of blood received in culture bottles   Culture   Final    NO GROWTH < 12 HOURS Performed at [facility], [address], [place], [place] [address]    Report Status PENDING  Incomplete  MRSA PCR Screening     Status: None   Collection Time: 03/18/17 11:50 PM  Result Value Ref Range Status   MRSA by PCR NEGATIVE NEGATIVE Final    Comment:        The GeneXpert MRSA Assay (FDA approved for NASAL specimens only), is one component of a comprehensive MRSA colonization surveillance program. It is not intended to diagnose MRSA infection nor to guide or monitor treatment for MRSA infections. Performed at [facility], [address] [place], [place] [address]      Studies: Ct Abdomen Pelvis Wo  Contrast  Result Date: 03/19/2017 CLINICAL DATA:  Shoulder surgery 4 days ago. Unable to urinate. Hydronephrosis. EXAM: CT ABDOMEN AND PELVIS WITHOUT CONTRAST TECHNIQUE: Multidetector CT imaging of the abdomen and pelvis was performed following the standard protocol without IV contrast. COMPARISON:  CT abdomen and pelvis 01/27/2017 FINDINGS: Lower chest: Inferior medial lower lobe airspace disease is present bilaterally, right greater than left. There is scattered disease in the posterior lingula. The heart size is normal. Coronary artery calcifications are present. No significant pleural or pericardial effusion is present. Hepatobiliary: No focal liver abnormality is seen. Status post cholecystectomy. No biliary dilatation. Pancreas: Unremarkable. No pancreatic ductal dilatation or surrounding inflammatory changes. Spleen: Normal in size without focal abnormality. Adrenals/Urinary Tract: Adrenal glands are normal bilaterally. A punctate left lower pole kidney stone is present. There is no hydronephrosis. Ureters are within normal limits. A Foley catheter is present within the urinary bladder. Stomach/Bowel: Gastric bypass surgery is noted. Stomach and duodenum are otherwise within normal limits. Small bowel is unremarkable. The terminal ileum is within normal limits. The appendix is not discretely visualized and may be surgically absent. The ascending and transverse colon are within normal limits. Descending and sigmoid colon are normal. Vascular/Lymphatic: Extensive vascular calcifications are present. No significant adenopathy is present. Reproductive: Uterus and bilateral adnexa are unremarkable. Other: No abdominal wall hernia or abnormality. No abdominopelvic ascites. Musculoskeletal: Subcutaneous edema is most prominent posteriorly. Calcified L4-5 disc is again seen. Vertebral body heights and alignment are maintained. Schmorl's nodes are evident. The bony pelvis is intact. The hips are located and within  normal limits bilaterally. IMPRESSION: 1. Stable punctate nonobstructing stone at the lower pole left kidney. 2. Foley catheter in place without urinary tract obstruction. New bilateral medial airspace disease, right greater than left. This is most concerning for pneumonia. Aspiration is also considered. Electronically Signed   By: San Morelle M.D.   On: 03/19/2017 16:29   Ct Head Wo Contrast  Result Date: 03/18/2017 CLINICAL DATA:  Slurred speech and unsteady gait. Altered consciousness. EXAM: CT HEAD WITHOUT CONTRAST TECHNIQUE: Contiguous axial images were obtained from the base of the skull through the vertex without intravenous contrast. COMPARISON:  02/16/2015 FINDINGS: Brain: Cerebral atrophy which is age advanced. Right basal ganglia lacunar infarct is favored to be remote but new since 02/16/2015. Example image 15/series 2. There is vague periventricular white matter hypoattenuation, including on images 18 and 19/series 2. This  is felt to be similar to on the prior. More diffuse low density in the periventricular white matter likely related to small vessel disease. No mass lesion, hemorrhage, hydrocephalus, acute infarct, intra-axial, or extra-axial fluid collection. Vascular: Bilateral vertebral artery atherosclerosis which is significantly age advanced. There is also right internal carotid atherosclerosis. Skull: Normal Sinuses/Orbits: Normal imaged portions of the orbits and globes. Clear paranasal sinuses and mastoid air cells. Other: None. IMPRESSION: 1.  No acute intracranial abnormality. 2. Cerebral atrophy with mild small vessel ischemic change. Areas of more focal hypoattenuation in the right frontal white matter are felt to be similar to the prior exam and likely related to remote ischemia. 3. Age advanced intracranial atherosclerosis. 4. Remote but interval right basal ganglia lacunar infarct. Electronically Signed   By: Abigail Miyamoto M.D.   On: 03/18/2017 16:46   US Renal  Result  Date: 03/19/2017 CLINICAL DATA:  Acute renal failure EXAM: RENAL / URINARY TRACT ULTRASOUND COMPLETE COMPARISON:  03/19/2017 FINDINGS: Right Kidney: Length: 8.3 cm. Echogenicity within normal limits. No mass or hydronephrosis visualized. Left Kidney: Length: 9.4 cm. Echogenicity within normal limits. No mass or hydronephrosis visualized. Bladder: Decompressed by Foley catheter IMPRESSION: No acute renal abnormality is noted. The overall appearance is similar to that seen on recent CT examination. The punctate lower pole left renal calculus is not well visualized. Electronically Signed   By: Inez Catalina M.D.   On: 03/19/2017 16:57   Dg Chest Portable 1 View  Result Date: 03/18/2017 CLINICAL DATA:  Weakness EXAM: PORTABLE CHEST 1 VIEW COMPARISON:  01/27/2017 FINDINGS: Cardiac shadow is stable in appearance. The overall inspiratory effort is poor with elevation the right hemidiaphragm. Additionally right basilar infiltrate is noted medially. No bony abnormality is noted. IMPRESSION: Right basilar infiltrate with hypoinflation Electronically Signed   By: Inez Catalina M.D.   On: 03/18/2017 18:35    Scheduled Meds: . aspirin EC  81 mg Oral Daily  . buPROPion  150 mg Oral Daily  . citalopram  40 mg Oral Daily  . ezetimibe  10 mg Oral QHS  . gabapentin  300 mg Oral BID  . heparin  5,000 Units Subcutaneous Q8H  . insulin aspart  0-5 Units Subcutaneous QHS  . insulin aspart  0-9 Units Subcutaneous TID WC  . levothyroxine  25 mcg Oral QAC breakfast  . mouth rinse  15 mL Mouth Rinse BID  . pantoprazole  40 mg Oral BID   Continuous Infusions: . sodium chloride 75 mL/hr at 03/20/17 0446  . azithromycin Stopped (03/19/17 1924)  . calcium gluconate 1 g (03/20/17 0841)  . cefTRIAXone (ROCEPHIN)  IV Stopped (03/19/17 1831)    Assessment/Plan:  1. Severe hyperkalemia.  Kayexalate ordered.  Will either be given orally or rectally depending on mental status at the time it gets up here.  Calcium, insulin and  D50 also ordered.  Get rid of Veltassa. 2. Acute kidney injury on chronic kidney disease Stage III.  Continue IV fluid hydration. 3. Acute metabolic encephalopathy.  Discontinue Ambien and Zanaflex.  Also get rid of stress dose steroids. 4. Pneumonia on IV ceftriaxone and Zithromax.  Also had recent UTI with E. coli.  The Rocephin will cover. 5. Type 2 diabetes mellitus.  Metformin on hold.  Sliding scale insulin. 6. Recent hypotension.  Blood pressure medications on hold. 7. GERD on PPI 8. Hypothyroidism unspecified on levothyroxine 9. Anxiety depression.  On Wellbutrin and Celexa.  Decrease dose of Xanax. 10. Recent right shoulder repair by Dr. [name].  Arm in brace.  Code Status:     Code Status Orders  (From admission, onward)        Start     Ordered   03/18/17 2348  Full code  Continuous     03/18/17 2348    Code Status History    Date Active Date Inactive Code Status Order ID Comments User Context   03/16/2017 15:39 03/16/2017 19:37 Full Code [id number]  Corky Mull, MD Inpatient   01/27/2017 19:34 01/29/2017 18:39 Full Code [id number]  Gorden Harms, MD Inpatient   01/07/2016 16:30 01/07/2016 20:16 Full Code [id number]  Corky Mull, MD Inpatient   12/14/2015 02:39 12/14/2015 17:33 Full Code [id number]  Harrie Foreman, MD ED   11/07/2015 15:21 11/08/2015 19:20 Full Code [id number]  Baxter Hire, MD Inpatient   10/28/2015 16:04 11/02/2015 15:04 Full Code [id number]  Henreitta Leber, MD Inpatient   06/03/2015 15:10 06/05/2015 15:01 Full Code [id number]  Fritzi Mandes, MD Inpatient   04/17/2015 21:59 04/18/2015 20:45 Full Code [id number]  Vaughan Basta, MD ED   02/15/2015 20:04 02/20/2015 19:03 Full Code [id number]  Gonzella Lex, MD Inpatient   11/12/2014 08:33 11/13/2014 14:49 Full Code [id number]  Isaias Cowman, MD Inpatient   11/07/2014 21:53 11/12/2014 08:33 Full Code [id number]  Henreitta Leber, MD Inpatient     Disposition Plan: To be  determined  Consultants:  Nephrology  Antibiotics:  Rocephin  Zithromax  Time spent: 28 minutes  [place]

## 2017-03-20 NOTE — Progress Notes (Signed)
PT Cancellation Note  Patient Details Name: [name] MRN: [id number] DOB: 1970-07-12   Cancelled Treatment:    Reason Eval/Treat Not Completed: Fatigue/lethargy limiting ability to participate(Chart reviewed, RN consulted. Pt not slurring words, not able to maintain eyes open, inappropriate for OOB assessment at this time. Will try again at later date/time. )  10:30 AM, 03/20/17 Etta Grandchild, PT, DPT Physical Therapist - [place] [phone] (mobile)   Buccola,Allan C 03/20/2017, 10:30 AM

## 2017-03-20 NOTE — Progress Notes (Signed)
Pts son at the bedside stating that the pt  keeps hearing people ask her questions such as "what are you ordering for dinner, and "hearing a phone ring". Pt states she doesn't have thoughts of hurting herself, its just people asking questions". MD [place] notified, no new orders.

## 2017-03-21 LAB — BASIC METABOLIC PANEL
Anion gap: 8 (ref 5–15)
BUN: 43 mg/dL — ABNORMAL HIGH (ref 6–20)
CALCIUM: 8.1 mg/dL — AB (ref 8.9–10.3)
CO2: 19 mmol/L — AB (ref 22–32)
CREATININE: 3.82 mg/dL — AB (ref 0.44–1.00)
Chloride: 114 mmol/L — ABNORMAL HIGH (ref 101–111)
GFR calc non Af Amer: 13 mL/min — ABNORMAL LOW (ref >60)
GFR, EST AFRICAN AMERICAN: 15 mL/min — AB (ref >60)
Glucose, Bld: 92 mg/dL (ref 65–99)
Potassium: 4 mmol/L (ref 3.5–5.1)
SODIUM: 141 mmol/L (ref 135–145)

## 2017-03-21 LAB — PROCALCITONIN: Procalcitonin: 0.22 ng/mL

## 2017-03-21 LAB — GLUCOSE, CAPILLARY
GLUCOSE-CAPILLARY: 89 mg/dL (ref 65–99)
Glucose-Capillary: 89 mg/dL (ref 65–99)
Glucose-Capillary: 135 mg/dL — ABNORMAL HIGH (ref 65–99)
Glucose-Capillary: 136 mg/dL — ABNORMAL HIGH (ref 65–99)
Glucose-Capillary: 154 mg/dL — ABNORMAL HIGH (ref 65–99)

## 2017-03-21 MED ORDER — HYDRALAZINE HCL 20 MG/ML IJ SOLN
10.0000 mg | INTRAMUSCULAR | Status: DC | PRN
  Administered 2017-03-21: 10 mg via INTRAVENOUS
  Filled 2017-03-21: qty 1

## 2017-03-21 MED ORDER — POLYETHYLENE GLYCOL 3350 17 G PO PACK
17.0000 g | PACK | Freq: Every day | ORAL | Status: DC
  Administered 2017-03-21: 17 g via ORAL
  Filled 2017-03-21 (×2): qty 1

## 2017-03-21 MED ORDER — TRAZODONE HCL 50 MG PO TABS
50.0000 mg | ORAL_TABLET | Freq: Every day | ORAL | Status: DC
  Administered 2017-03-21: 50 mg via ORAL
  Filled 2017-03-21: qty 1

## 2017-03-21 MED ORDER — OXYCODONE HCL 5 MG PO TABS
2.5000 mg | ORAL_TABLET | Freq: Four times a day (QID) | ORAL | Status: DC | PRN
  Administered 2017-03-21 – 2017-03-22 (×3): 2.5 mg via ORAL
  Filled 2017-03-21 (×3): qty 1

## 2017-03-21 MED ORDER — AZITHROMYCIN 250 MG PO TABS
250.0000 mg | ORAL_TABLET | Freq: Every day | ORAL | Status: DC
  Administered 2017-03-21 – 2017-03-22 (×2): 250 mg via ORAL
  Filled 2017-03-21 (×2): qty 1

## 2017-03-21 MED ORDER — IPRATROPIUM-ALBUTEROL 0.5-2.5 (3) MG/3ML IN SOLN
3.0000 mL | Freq: Four times a day (QID) | RESPIRATORY_TRACT | Status: DC | PRN
  Administered 2017-03-21: 3 mL via RESPIRATORY_TRACT
  Filled 2017-03-21: qty 3

## 2017-03-21 MED ORDER — METOPROLOL SUCCINATE ER 25 MG PO TB24
25.0000 mg | ORAL_TABLET | Freq: Every day | ORAL | Status: DC
  Administered 2017-03-21 – 2017-03-22 (×2): 25 mg via ORAL
  Filled 2017-03-21 (×2): qty 1

## 2017-03-21 MED ORDER — BUDESONIDE 0.5 MG/2ML IN SUSP
0.5000 mg | Freq: Two times a day (BID) | RESPIRATORY_TRACT | Status: DC
  Administered 2017-03-21 – 2017-03-22 (×2): 0.5 mg via RESPIRATORY_TRACT
  Filled 2017-03-21 (×3): qty 2

## 2017-03-21 MED ORDER — DILTIAZEM HCL ER COATED BEADS 120 MG PO CP24
120.0000 mg | ORAL_CAPSULE | Freq: Every day | ORAL | Status: DC
  Administered 2017-03-21 – 2017-03-22 (×2): 120 mg via ORAL
  Filled 2017-03-21 (×2): qty 1

## 2017-03-21 MED ORDER — IPRATROPIUM-ALBUTEROL 0.5-2.5 (3) MG/3ML IN SOLN
3.0000 mL | Freq: Four times a day (QID) | RESPIRATORY_TRACT | Status: DC
  Administered 2017-03-21 (×2): 3 mL via RESPIRATORY_TRACT
  Filled 2017-03-21 (×3): qty 3

## 2017-03-21 NOTE — Evaluation (Addendum)
Physical Therapy Evaluation Patient Details Name: [name] MRN: [id number] DOB: 06-02-1970 Today's Date: 03/21/2017   History of Present Illness  Pt admitted for complaints of weakness and confusion s/p R rotator cuff repair. History includes CVA, failed previous rotator cuff sx, breast cancer, MI, and CAD. At baseline, pt relies on son for full care and rarely gets up when home alone.  Clinical Impression  Pt is a pleasant 47 year old female who was admitted for AMS after recent R rotator cuff repair. Pt A&O at this time. Pt performs bed mobility with min assist and transfers/ambulation with cga and HHA. Pt educated on correct donning of sling and to keep hand/wrist moving. Pt demonstrates deficits with strength/mobility/balance. R side residual weakness present from previous CVA; no buckling noted. All mobility performed on room air with sats decreasing to 88%, 2L of O2 reapplied with sats improved to 92%. Would benefit from trial with AD to improve safely and independence when home alone. Would benefit from skilled PT to address above deficits and promote optimal return to PLOF. Recommend transition to HHPT upon discharge from acute hospitalization.       Follow Up Recommendations Home health PT    Equipment Recommendations  Cane    Recommendations for Other Services       Precautions / Restrictions Precautions Precautions: Fall Restrictions Weight Bearing Restrictions: No      Mobility  Bed Mobility Overal bed mobility: Needs Assistance Bed Mobility: Supine to Sit     Supine to sit: Min assist     General bed mobility comments: needs assist for trunkal elevation. Once seated at EOB, able to sit with upright posture. R shoulder sling adjusted.  Transfers Overall transfer level: Needs assistance Equipment used: 1 person hand held assist Transfers: Sit to/from Stand Sit to Stand: Min guard         General transfer comment: safe technique with no buckling  noted. Provided HHA, however would recommend SPC vs HW for further mobility  Ambulation/Gait Ambulation/Gait assistance: Min guard Ambulation Distance (Feet): 3 Feet Assistive device: 1 person hand held assist Gait Pattern/deviations: Step-to pattern     General Gait Details: cautious stepping to recliner for lunch. Further ambulation deferred. Recommend use of AD  Stairs            Wheelchair Mobility    Modified Rankin (Stroke Patients Only)       Balance Overall balance assessment: History of Falls;Needs assistance Sitting-balance support: Feet supported Sitting balance-Leahy Scale: Good     Standing balance support: Single extremity supported Standing balance-Leahy Scale: Fair                               Pertinent Vitals/Pain Pain Assessment: Faces Faces Pain Scale: Hurts little more Pain Location: R shoulder Pain Descriptors / Indicators: Operative site guarding Pain Intervention(s): Limited activity within patient's tolerance    Home Living Family/patient expects to be discharged to:: Private residence Living Arrangements: Children;Non-relatives/Friends(roommate) Available Help at Discharge: Family;Available PRN/intermittently Type of Home: House Home Access: Stairs to enter Entrance Stairs-Rails: Left Entrance Stairs-Number of Steps: 3 Home Layout: One level Home Equipment: None      Prior Function Level of Independence: Needs assistance   Gait / Transfers Assistance Needed: holds onto son during mobility, doesn't use AD  ADL's / Homemaking Assistance Needed: Reports she depends on son for assistance for most ADLs        Hand  Dominance        Extremity/Trunk Assessment   Upper Extremity Assessment Upper Extremity Assessment: Generalized weakness(B grip strength 4/5; R UE not tested; L UE grossly 4/5)    Lower Extremity Assessment Lower Extremity Assessment: Generalized weakness       Communication   Communication: No  difficulties  Cognition Arousal/Alertness: Awake/alert Behavior During Therapy: WFL for tasks assessed/performed Overall Cognitive Status: Within Functional Limits for tasks assessed                                        General Comments      Exercises Other Exercises Other Exercises: supine ther-ex performed on B LE including ankle pumps, SLRs, and hip abd/add. Also performed grip squeezes on R hand. Cues given for correct sequencing x 10 reps with cga on L LE and min assist for R LE.   Assessment/Plan    PT Assessment Patient needs continued PT services  PT Problem List Decreased strength;Decreased activity tolerance;Decreased balance;Decreased mobility;Pain       PT Treatment Interventions Gait training;DME instruction;Stair training;Therapeutic exercise    PT Goals (Current goals can be found in the Care Plan section)  Acute Rehab PT Goals Patient Stated Goal: to go home PT Goal Formulation: With patient Time For Goal Achievement: 04/04/17 Potential to Achieve Goals: Good    Frequency 7X/week(recent shoulder sx)   Barriers to discharge        Co-evaluation               AM-PAC PT "6 Clicks" Daily Activity  Outcome Measure Difficulty turning over in bed (including adjusting bedclothes, sheets and blankets)?: Unable Difficulty moving from lying on back to sitting on the side of the bed? : Unable Difficulty sitting down on and standing up from a chair with arms (e.g., wheelchair, bedside commode, etc,.)?: Unable Help needed moving to and from a bed to chair (including a wheelchair)?: A Little Help needed walking in hospital room?: A Little Help needed climbing 3-5 steps with a railing? : A Lot 6 Click Score: 11    End of Session Equipment Utilized During Treatment: Gait belt;Oxygen Activity Tolerance: Patient limited by pain Patient left: in chair;with chair alarm set Nurse Communication: Mobility status PT Visit Diagnosis: Muscle weakness  (generalized) (M62.81);Difficulty in walking, not elsewhere classified (R26.2);Pain;Unsteadiness on feet (R26.81) Pain - Right/Left: Right Pain - part of body: Shoulder    Time: 9528-4132 PT Time Calculation (min) (ACUTE ONLY): 23 min   Charges:   PT Evaluation $PT Eval Low Complexity: 1 Low PT Treatments $Therapeutic Exercise: 8-22 mins   PT G Codes:        Elizabeth Palau, PT, DPT [phone]   Jasiyah Poland 03/21/2017, 2:14 PM

## 2017-03-21 NOTE — Progress Notes (Signed)
Pts BP 178/82, MD [place] notified, MD placing order.

## 2017-03-21 NOTE — Progress Notes (Addendum)
Pnt reports right shoulder pain 10/10. Administered tylenol per orders. Pnt also c/o sob , audible wheezing. O2 sats 96% on RA. Notified and spoke with oncall MD Dr. Jerelyn Charles per T.O. duoneb q6 PRN give first dose now. BP elevated pain managed with medications. No other issues or concerns at this time will continue to monitor and assess.

## 2017-03-21 NOTE — Progress Notes (Signed)
[facility]  ROUNDING NOTE   Subjective:  Urine output was 1.6 L over the preceding 24 hours. Creatinine down to 3.82 today. EGFR only minimally changed up to 13.   Objective:  Vital signs in last 24 hours:  Temp:  [98 F (36.7 C)-98.6 F (37 C)] 98.5 F (36.9 C) (02/17 0819) Pulse Rate:  [75-94] 80 (02/17 0819) Resp:  [18] 18 (02/17 0819) BP: (158-183)/(76-95) 178/82 (02/17 0819) SpO2:  [93 %-100 %] 95 % (02/17 0819)  Weight change:  Filed Weights   03/18/17 1651 03/18/17 2357  Weight: 74.4 kg (164 lb) 80.8 kg (178 lb 2.1 oz)    Intake/Output: I/O last 3 completed shifts: In: 22 [P.O.:240; I.V.:750] Out: 1850 [Urine:1850]   Intake/Output this shift:  Total I/O In: 360 [P.O.:360] Out: 1300 [Urine:1300]  Physical Exam: General: No acute distress  Head: Normocephalic, atraumatic. Moist oral mucosal membranes  Eyes: Anicteric  Neck: Supple, trachea midline  Lungs:  Clear to auscultation, normal effort  Heart: S1S2 no rubs  Abdomen:  Soft, nontender, bowel sounds present  Extremities: 1+ peripheral edema.  Neurologic: Awake, alert, follows commands  Skin: No lesions       Basic Metabolic Panel: Recent Labs  Lab 03/18/17 1702 03/19/17 0035 03/19/17 0522 03/19/17 0807 03/20/17 0444 03/20/17 1520 03/21/17 0504  NA 134* 134* 137  --  136  --  141  K 4.9 5.4* 5.7* 5.2* 5.9* 5.8* 4.0  CL 101 109 113*  --  107  --  114*  CO2 19* 16* 16*  --  18*  --  19*  GLUCOSE 191* 155* 192*  --  159*  --  92  BUN 33* 33* 33*  --  40*  --  43*  CREATININE 4.13* 3.76* 3.89*  --  4.09*  --  3.82*  CALCIUM 8.8* 7.3* 8.2*  --  8.3*  --  8.1*  MG  --  1.2* 2.3  --   --   --   --   PHOS  --  7.3* 7.1*  --  6.8*  --   --     Liver Function Tests: Recent Labs  Lab 03/20/17 0444  ALBUMIN 3.0*   No results for input(s): LIPASE, AMYLASE in the last 168 hours. No results for input(s): AMMONIA in the last 168 hours.  CBC: Recent Labs  Lab 03/18/17 1702  03/19/17 0522 03/20/17 0444  WBC 21.7* 19.9* 16.0*  NEUTROABS  --   --  14.5*  HGB 13.0 11.4* 10.5*  HCT 39.9 34.8* 32.2*  MCV 84.2 84.6 83.7  PLT 254 176 185    Cardiac Enzymes: No results for input(s): CKTOTAL, CKMB, CKMBINDEX, TROPONINI in the last 168 hours.  BNP: Invalid input(s): POCBNP  CBG: Recent Labs  Lab 03/20/17 1942 03/20/17 2103 03/21/17 0406 03/21/17 0757 03/21/17 1209  GLUCAP 141* 106* 89 29 135*    Microbiology: Results for orders placed or performed during the hospital encounter of 03/18/17  Blood Culture (routine x 2)     Status: None (Preliminary result)   Collection Time: 03/18/17  8:08 PM  Result Value Ref Range Status   Specimen Description BLOOD BLOOD LEFT HAND  Final   Special Requests   Final    BOTTLES DRAWN AEROBIC AND ANAEROBIC Blood Culture adequate volume   Culture   Final    NO GROWTH 3 DAYS Performed at [facility], [address], [place], [place] [address]    Report Status PENDING  Incomplete  Blood  Culture (routine x 2)     Status: None (Preliminary result)   Collection Time: 03/18/17  8:08 PM  Result Value Ref Range Status   Specimen Description BLOOD LEFT ANTECUBITAL  Final   Special Requests   Final    BOTTLES DRAWN AEROBIC AND ANAEROBIC Blood Culture results may not be optimal due to an excessive volume of blood received in culture bottles   Culture   Final    NO GROWTH 3 DAYS Performed at [facility], [address] [place], [place] [address]    Report Status PENDING  Incomplete  Urine culture     Status: None   Collection Time: 03/18/17  8:08 PM  Result Value Ref Range Status   Specimen Description   Final    URINE, RANDOM Performed at [facility], [address], [place], [place] [address]    Special Requests   Final    NONE Performed at [facility], [address] [place], [place] [address]    Culture   Final    NO GROWTH Performed at [facility], [place] [address] [place], [place] [address]    Report Status 03/20/2017 FINAL  Final  MRSA PCR Screening     Status: None   Collection Time: 03/18/17 11:50 PM  Result Value Ref Range Status   MRSA by PCR NEGATIVE NEGATIVE Final    Comment:        The GeneXpert MRSA Assay (FDA approved for NASAL specimens only), is one component of a comprehensive MRSA colonization surveillance program. It is not intended to diagnose MRSA infection nor to guide or monitor treatment for MRSA infections. Performed at [facility], [address] [place], [place] [address]     Coagulation Studies: Recent Labs    03/19/17 0035  LABPROT 15.1  INR 1.20    Urinalysis: Recent Labs    03/18/17 2008  COLORURINE AMBER*  LABSPEC 1.014  PHURINE 5.0  GLUCOSEU NEGATIVE  HGBUR SMALL*  BILIRUBINUR NEGATIVE  KETONESUR NEGATIVE  PROTEINUR 30*  NITRITE NEGATIVE  LEUKOCYTESUR MODERATE*      Imaging: Ct Abdomen Pelvis Wo Contrast  Result Date: 03/19/2017 CLINICAL DATA:  Shoulder surgery 4 days ago. Unable to urinate. Hydronephrosis. EXAM: CT ABDOMEN AND PELVIS WITHOUT CONTRAST TECHNIQUE: Multidetector CT imaging of the abdomen and pelvis was performed following the standard protocol without IV contrast. COMPARISON:  CT abdomen and pelvis 01/27/2017 FINDINGS: Lower chest: Inferior medial lower lobe airspace disease is present bilaterally, right greater than left. There is scattered disease in the posterior lingula. The heart size is normal. Coronary artery calcifications are present. No significant pleural or pericardial effusion is present. Hepatobiliary: No focal liver abnormality is seen. Status post cholecystectomy. No biliary dilatation. Pancreas: Unremarkable. No pancreatic ductal dilatation or surrounding inflammatory changes. Spleen: Normal in size without focal abnormality. Adrenals/Urinary Tract: Adrenal glands are normal bilaterally. A punctate left lower pole kidney stone is present. There is no  hydronephrosis. Ureters are within normal limits. A Foley catheter is present within the urinary bladder. Stomach/Bowel: Gastric bypass surgery is noted. Stomach and duodenum are otherwise within normal limits. Small bowel is unremarkable. The terminal ileum is within normal limits. The appendix is not discretely visualized and may be surgically absent. The ascending and transverse colon are within normal limits. Descending and sigmoid colon are normal. Vascular/Lymphatic: Extensive vascular calcifications are present. No significant adenopathy is present. Reproductive: Uterus and bilateral adnexa are unremarkable. Other: No abdominal wall hernia or abnormality.  No abdominopelvic ascites. Musculoskeletal: Subcutaneous edema is most prominent posteriorly. Calcified L4-5 disc is again seen. Vertebral body heights and alignment are maintained. Schmorl's nodes are evident. The bony pelvis is intact. The hips are located and within normal limits bilaterally. IMPRESSION: 1. Stable punctate nonobstructing stone at the lower pole left kidney. 2. Foley catheter in place without urinary tract obstruction. New bilateral medial airspace disease, right greater than left. This is most concerning for pneumonia. Aspiration is also considered. Electronically Signed   By: San Morelle M.D.   On: 03/19/2017 16:29   US Renal  Result Date: 03/19/2017 CLINICAL DATA:  Acute renal failure EXAM: RENAL / URINARY TRACT ULTRASOUND COMPLETE COMPARISON:  03/19/2017 FINDINGS: Right Kidney: Length: 8.3 cm. Echogenicity within normal limits. No mass or hydronephrosis visualized. Left Kidney: Length: 9.4 cm. Echogenicity within normal limits. No mass or hydronephrosis visualized. Bladder: Decompressed by Foley catheter IMPRESSION: No acute renal abnormality is noted. The overall appearance is similar to that seen on recent CT examination. The punctate lower pole left renal calculus is not well visualized. Electronically Signed   By: Inez Catalina M.D.   On: 03/19/2017 16:57     Medications:   . sodium chloride 30 mL/hr at 03/21/17 0823  . cefTRIAXone (ROCEPHIN)  IV Stopped (03/20/17 1834)   . aspirin EC  81 mg Oral Daily  . azithromycin  250 mg Oral Daily  . budesonide (PULMICORT) nebulizer solution  0.5 mg Nebulization BID  . buPROPion  150 mg Oral Daily  . citalopram  40 mg Oral Daily  . diltiazem  120 mg Oral Daily  . ezetimibe  10 mg Oral QHS  . gabapentin  300 mg Oral BID  . heparin  5,000 Units Subcutaneous Q8H  . insulin aspart  0-5 Units Subcutaneous QHS  . insulin aspart  0-9 Units Subcutaneous TID WC  . ipratropium-albuterol  3 mL Nebulization Q6H  . levothyroxine  25 mcg Oral QAC breakfast  . mouth rinse  15 mL Mouth Rinse BID  . pantoprazole  40 mg Oral BID  . patiromer  8.4 g Oral Daily  . polyethylene glycol  17 g Oral Daily  . traZODone  50 mg Oral QHS   acetaminophen **OR** acetaminophen, ALPRAZolam, hyoscyamine, ondansetron **OR** ondansetron (ZOFRAN) IV, oxyCODONE  Assessment/ Plan:  47 y.o. female past medical history hypertension, diabetes mellitus for over 20 years, diabetic retinopathy, history of CVA, gastric ulcer, colonic polyps, hyperlipidemia, gastric bypass performed [facility], CVA with mild L sided weakness 2015, carotild artery bypass and depression    1.  Acute renal failure due to hypotension. 2.  Chronic kidney disease stage III baseline creatinine 1.8. 3.  Hyperkalemia. 4.  Hypotension.  Plan: The patient's creatinine has started to improve.  Currently it is down to 3.82.  Urine output of 1.6 L over the preceding 24 hours noted.  Agree with reducing normal saline at 30 cc/h.  Maintain the patient on [place] 8.4 g p.o. daily for now.  Potassium down to 4.0 today.  Otherwise continue supportive care and avoid nephrotoxins and hypotension as possible.   LOS: 3 Saqib Cazarez 2/17/201912:48 PM

## 2017-03-21 NOTE — Progress Notes (Addendum)
Patient ID: [name], female   DOB: 1970-05-06, 47 y.o.   MRN: [id number]  [employer] PROGRESS NOTE  Maizley Eilertson GNF:[id number] DOB: 1970-10-11 DOA: 03/18/2017 PCP: [name], MD  HPI/Subjective: Patient up and awake and talking with me.  States she has 10 out of 10 pain in her right shoulder.  States she has not had a bowel movement since coming in.  Slight shortness of breath.  Objective: Vitals:   03/21/17 0334 03/21/17 0547  BP: (!) 183/95   Pulse: 90   Resp:    Temp: 98 F (36.7 C)   SpO2: 96% 93%    Filed Weights   03/18/17 1651 03/18/17 2357  Weight: 74.4 kg (164 lb) 80.8 kg (178 lb 2.1 oz)    ROS: Review of Systems  Constitutional: Negative for chills and fever.  Eyes: Negative for blurred vision.  Respiratory: Negative for cough and shortness of breath.   Cardiovascular: Negative for chest pain.  Gastrointestinal: Positive for constipation. Negative for abdominal pain, diarrhea, nausea and vomiting.  Genitourinary: Negative for dysuria.  Musculoskeletal: Negative for joint pain.  Neurological: Negative for dizziness and headaches.   Exam: Physical Exam  HENT:  Nose: No mucosal edema.  Mouth/Throat: No oropharyngeal exudate or posterior oropharyngeal edema.  Eyes: Conjunctivae, EOM and lids are normal. Pupils are equal, round, and reactive to light.  Neck: No JVD present. Carotid bruit is not present. No edema present. No thyroid mass and no thyromegaly present.  Cardiovascular: S1 normal and S2 normal. Exam reveals no gallop.  No murmur heard. Pulses:      Dorsalis pedis pulses are 2+ on the right side, and 2+ on the left side.  Respiratory: No respiratory distress. She has decreased breath sounds in the right middle field, the right lower field, the left middle field and the left lower field. She has no wheezes. She has no rhonchi. She has no rales.  Wheeze heard anteriorly  GI: Soft. Bowel sounds are normal. There is no tenderness.   Musculoskeletal:       Right ankle: She exhibits no swelling.       Left ankle: She exhibits no swelling.  Lymphadenopathy:    She has no cervical adenopathy.  Neurological: She is alert. No cranial nerve deficit.  Skin: Skin is warm. No rash noted. Nails show no clubbing.  Psychiatric: She has a normal mood and affect.      Data Reviewed: Basic Metabolic Panel: Recent Labs  Lab 03/18/17 1702 03/19/17 0035 03/19/17 0522 03/19/17 0807 03/20/17 0444 03/20/17 1520 03/21/17 0504  NA 134* 134* 137  --  136  --  141  K 4.9 5.4* 5.7* 5.2* 5.9* 5.8* 4.0  CL 101 109 113*  --  107  --  114*  CO2 19* 16* 16*  --  18*  --  19*  GLUCOSE 191* 155* 192*  --  159*  --  92  BUN 33* 33* 33*  --  40*  --  43*  CREATININE 4.13* 3.76* 3.89*  --  4.09*  --  3.82*  CALCIUM 8.8* 7.3* 8.2*  --  8.3*  --  8.1*  MG  --  1.2* 2.3  --   --   --   --   PHOS  --  7.3* 7.1*  --  6.8*  --   --    Liver Function Tests: Recent Labs  Lab 03/20/17 0444  ALBUMIN 3.0*   CBC: Recent Labs  Lab  03/18/17 1702 03/19/17 0522 03/20/17 0444  WBC 21.7* 19.9* 16.0*  NEUTROABS  --   --  14.5*  HGB 13.0 11.4* 10.5*  HCT 39.9 34.8* 32.2*  MCV 84.2 84.6 83.7  PLT 254 176 185    CBG: Recent Labs  Lab 03/20/17 1202 03/20/17 1647 03/20/17 1942 03/20/17 2103 03/21/17 0406  GLUCAP 163* 177* 141* 106* 89    Recent Results (from the past 240 hour(s))  Urine Culture     Status: Abnormal   Collection Time: 03/11/17  2:36 PM  Result Value Ref Range Status   Urine Culture, Routine Final report (A)  Final   Organism ID, Bacteria Escherichia coli (A)  Final    Comment: Greater than 100,000 colony forming units per mL Cefazolin <=4 ug/mL Cefazolin with an MIC <=16 predicts susceptibility to the oral agents cefaclor, cefdinir, cefpodoxime, cefprozil, cefuroxime, cephalexin, and loracarbef when used for therapy of uncomplicated urinary tract infections due to E. coli, Klebsiella pneumoniae, and  Proteus mirabilis.    Antimicrobial Susceptibility Comment  Final    Comment:       ** S = Susceptible; I = Intermediate; R = Resistant **                    P = Positive; N = Negative             MICS are expressed in micrograms per mL    Antibiotic                 RSLT#1    RSLT#2    RSLT#3    RSLT#4 Amoxicillin/Clavulanic Acid    S Ampicillin                     S Cefepime                       S Ceftriaxone                    S Cefuroxime                     S Ciprofloxacin                  S Ertapenem                      S Gentamicin                     S Imipenem                       S Levofloxacin                   S Meropenem                      S Nitrofurantoin                 S Piperacillin/Tazobactam        S Tetracycline                   S Tobramycin                     S Trimethoprim/Sulfa             S   Blood Culture (routine x 2)     Status: None (Preliminary  result)   Collection Time: 03/18/17  8:08 PM  Result Value Ref Range Status   Specimen Description BLOOD BLOOD LEFT HAND  Final   Special Requests   Final    BOTTLES DRAWN AEROBIC AND ANAEROBIC Blood Culture adequate volume   Culture   Final    NO GROWTH 3 DAYS Performed at [facility], [address], [place], [place] [address]    Report Status PENDING  Incomplete  Blood Culture (routine x 2)     Status: None (Preliminary result)   Collection Time: 03/18/17  8:08 PM  Result Value Ref Range Status   Specimen Description BLOOD LEFT ANTECUBITAL  Final   Special Requests   Final    BOTTLES DRAWN AEROBIC AND ANAEROBIC Blood Culture results may not be optimal due to an excessive volume of blood received in culture bottles   Culture   Final    NO GROWTH 3 DAYS Performed at [facility], [address] [place], [place] [address]    Report Status PENDING  Incomplete  Urine culture     Status: None   Collection Time: 03/18/17  8:08 PM  Result Value Ref Range Status   Specimen  Description   Final    URINE, RANDOM Performed at [facility], [address] [place], [place] [address]    Special Requests   Final    NONE Performed at [facility], [address] [place], [place] [address]    Culture   Final    NO GROWTH Performed at [facility], [address], [place], [place] [address]    Report Status 03/20/2017 FINAL  Final  MRSA PCR Screening     Status: None   Collection Time: 03/18/17 11:50 PM  Result Value Ref Range Status   MRSA by PCR NEGATIVE NEGATIVE Final    Comment:        The GeneXpert MRSA Assay (FDA approved for NASAL specimens only), is one component of a comprehensive MRSA colonization surveillance program. It is not intended to diagnose MRSA infection nor to guide or monitor treatment for MRSA infections. Performed at [facility], [address] [place], [place] [address]      Studies: Ct Abdomen Pelvis Wo Contrast  Result Date: 03/19/2017 CLINICAL DATA:  Shoulder surgery 4 days ago. Unable to urinate. Hydronephrosis. EXAM: CT ABDOMEN AND PELVIS WITHOUT CONTRAST TECHNIQUE: Multidetector CT imaging of the abdomen and pelvis was performed following the standard protocol without IV contrast. COMPARISON:  CT abdomen and pelvis 01/27/2017 FINDINGS: Lower chest: Inferior medial lower lobe airspace disease is present bilaterally, right greater than left. There is scattered disease in the posterior lingula. The heart size is normal. Coronary artery calcifications are present. No significant pleural or pericardial effusion is present. Hepatobiliary: No focal liver abnormality is seen. Status post cholecystectomy. No biliary dilatation. Pancreas: Unremarkable. No pancreatic ductal dilatation or surrounding inflammatory changes. Spleen: Normal in size without focal abnormality. Adrenals/Urinary Tract: Adrenal glands are normal bilaterally. A punctate left lower pole kidney stone is present. There is no  hydronephrosis. Ureters are within normal limits. A Foley catheter is present within the urinary bladder. Stomach/Bowel: Gastric bypass surgery is noted. Stomach and duodenum are otherwise within normal limits. Small bowel is unremarkable. The terminal ileum is within normal limits. The appendix is not discretely visualized and may be surgically absent. The ascending and transverse colon are within normal limits. Descending and sigmoid colon are normal. Vascular/Lymphatic: Extensive vascular calcifications are present.  No significant adenopathy is present. Reproductive: Uterus and bilateral adnexa are unremarkable. Other: No abdominal wall hernia or abnormality. No abdominopelvic ascites. Musculoskeletal: Subcutaneous edema is most prominent posteriorly. Calcified L4-5 disc is again seen. Vertebral body heights and alignment are maintained. Schmorl's nodes are evident. The bony pelvis is intact. The hips are located and within normal limits bilaterally. IMPRESSION: 1. Stable punctate nonobstructing stone at the lower pole left kidney. 2. Foley catheter in place without urinary tract obstruction. New bilateral medial airspace disease, right greater than left. This is most concerning for pneumonia. Aspiration is also considered. Electronically Signed   By: Marin Roberts M.D.   On: 03/19/2017 16:29   US Renal  Result Date: 03/19/2017 CLINICAL DATA:  Acute renal failure EXAM: RENAL / URINARY TRACT ULTRASOUND COMPLETE COMPARISON:  03/19/2017 FINDINGS: Right Kidney: Length: 8.3 cm. Echogenicity within normal limits. No mass or hydronephrosis visualized. Left Kidney: Length: 9.4 cm. Echogenicity within normal limits. No mass or hydronephrosis visualized. Bladder: Decompressed by Foley catheter IMPRESSION: No acute renal abnormality is noted. The overall appearance is similar to that seen on recent CT examination. The punctate lower pole left renal calculus is not well visualized. Electronically Signed   By: Alcide Clever M.D.   On: 03/19/2017 16:57    Scheduled Meds: . aspirin EC  81 mg Oral Daily  . azithromycin  250 mg Oral Daily  . budesonide (PULMICORT) nebulizer solution  0.5 mg Nebulization BID  . buPROPion  150 mg Oral Daily  . citalopram  40 mg Oral Daily  . ezetimibe  10 mg Oral QHS  . gabapentin  300 mg Oral BID  . heparin  5,000 Units Subcutaneous Q8H  . insulin aspart  0-5 Units Subcutaneous QHS  . insulin aspart  0-9 Units Subcutaneous TID WC  . ipratropium-albuterol  3 mL Nebulization Q6H  . levothyroxine  25 mcg Oral QAC breakfast  . mouth rinse  15 mL Mouth Rinse BID  . pantoprazole  40 mg Oral BID  . patiromer  8.4 g Oral Daily  . traZODone  50 mg Oral QHS   Continuous Infusions: . sodium chloride 50 mL/hr at 03/20/17 2014  . cefTRIAXone (ROCEPHIN)  IV Stopped (03/20/17 1834)    Assessment/Plan:  1. Acute kidney injury on chronic kidney disease Stage III.  Continue IV fluid hydration at lower rate.  Patient requesting Foley to come out.  Nephrology following. 2. Acute metabolic encephalopathy.  seems better this morning.  Since patient complaining of pain will have to give low-dose oxycodone and trazodone for sleep.  Continue to watch mental status with restarting some medications. 3. Pneumonia on IV ceftriaxone and Zithromax.  Also had recent UTI with E. coli.  The Rocephin will cover.  Incentive spirometer ordered. 4. Hyperkalemia treated yesterday.  Potassium now in normal range 5. Type 2 diabetes mellitus.  Metformin on hold.  Sliding scale insulin. 6. Wheeze.  Start nebulizer treatments with budesonide.  I stop stress dose steroids yesterday secondary to altered mental status.  Decrease rate of IV fluids. 7. Hypertension restart Cardizem CD 8. GERD on PPI 9. Hypothyroidism unspecified on levothyroxine 10. Anxiety depression.  On Wellbutrin and Celexa.  Decreased dose of Xanax. 11. Recent right shoulder repair by Dr. Joice Lofts.  Arm in brace. 12. Advised patient to sit  up in chair and try not to take naps during today.  Code Status:     Code Status Orders  (From admission, onward)        Start  Ordered   03/18/17 2348  Full code  Continuous     03/18/17 2348    Code Status History    Date Active Date Inactive Code Status Order ID Comments User Context   03/16/2017 15:39 03/16/2017 19:37 Full Code [id number]  Christena Flake, MD Inpatient   01/27/2017 19:34 01/29/2017 18:39 Full Code [id number]  Bertrum Sol, MD Inpatient   01/07/2016 16:30 01/07/2016 20:16 Full Code [id number]  Christena Flake, MD Inpatient   12/14/2015 02:39 12/14/2015 17:33 Full Code [id number]  Arnaldo Natal, MD ED   11/07/2015 15:21 11/08/2015 19:20 Full Code [id number]  Gracelyn Nurse, MD Inpatient   10/28/2015 16:04 11/02/2015 15:04 Full Code [id number]  Houston Siren, MD Inpatient   06/03/2015 15:10 06/05/2015 15:01 Full Code [id number]  Enedina Finner, MD Inpatient   04/17/2015 21:59 04/18/2015 20:45 Full Code [id number]  Altamese Dilling, MD ED   02/15/2015 20:04 02/20/2015 19:03 Full Code [id number]  Audery Amel, MD Inpatient   11/12/2014 08:33 11/13/2014 14:49 Full Code [id number]  Marcina Millard, MD Inpatient   11/07/2014 21:53 11/12/2014 08:33 Full Code [id number]  Houston Siren, MD Inpatient     Disposition Plan: To be determined  Consultants:  Nephrology  Antibiotics:  Rocephin  Zithromax  Time spent: 28 minutes.  Spoke with son on the phone.  Marzell Isakson [employer]

## 2017-03-21 NOTE — Progress Notes (Signed)
Pts BP 174/77, MD [place] notified. Orders received to restart pts Metoprolol XL 2 mg daily. Order placed.

## 2017-03-22 LAB — BASIC METABOLIC PANEL
Anion gap: 8 (ref 5–15)
BUN: 38 mg/dL — AB (ref 6–20)
CO2: 22 mmol/L (ref 22–32)
CREATININE: 3.05 mg/dL — AB (ref 0.44–1.00)
Calcium: 8.5 mg/dL — ABNORMAL LOW (ref 8.9–10.3)
Chloride: 113 mmol/L — ABNORMAL HIGH (ref 101–111)
GFR, EST AFRICAN AMERICAN: 20 mL/min — AB (ref >60)
GFR, EST NON AFRICAN AMERICAN: 17 mL/min — AB (ref >60)
Glucose, Bld: 153 mg/dL — ABNORMAL HIGH (ref 65–99)
Potassium: 3.4 mmol/L — ABNORMAL LOW (ref 3.5–5.1)
SODIUM: 143 mmol/L (ref 135–145)

## 2017-03-22 LAB — GLUCOSE, CAPILLARY
GLUCOSE-CAPILLARY: 147 mg/dL — AB (ref 65–99)
Glucose-Capillary: 151 mg/dL — ABNORMAL HIGH (ref 65–99)
Glucose-Capillary: 154 mg/dL — ABNORMAL HIGH (ref 65–99)

## 2017-03-22 MED ORDER — IPRATROPIUM-ALBUTEROL 0.5-2.5 (3) MG/3ML IN SOLN
3.0000 mL | Freq: Two times a day (BID) | RESPIRATORY_TRACT | Status: DC
  Administered 2017-03-22: 3 mL via RESPIRATORY_TRACT
  Filled 2017-03-22: qty 3

## 2017-03-22 MED ORDER — ALPRAZOLAM 0.5 MG PO TABS: 0.2500 mg | ORAL_TABLET | Freq: Four times a day (QID) | ORAL | 5 refills | Status: DC | PRN

## 2017-03-22 MED ORDER — AMOXICILLIN-POT CLAVULANATE 500-125 MG PO TABS
1.0000 | ORAL_TABLET | Freq: Two times a day (BID) | ORAL | Status: DC
  Administered 2017-03-22: 500 mg via ORAL
  Filled 2017-03-22 (×2): qty 1

## 2017-03-22 MED ORDER — EZETIMIBE 10 MG PO TABS: 10.0000 mg | ORAL_TABLET | Freq: Every day | ORAL | Status: DC

## 2017-03-22 MED ORDER — GABAPENTIN 300 MG PO CAPS: 300.0000 mg | ORAL_CAPSULE | Freq: Two times a day (BID) | ORAL | 0 refills | Status: DC

## 2017-03-22 MED ORDER — ACETAMINOPHEN 325 MG PO TABS: 650.0000 mg | ORAL_TABLET | Freq: Four times a day (QID) | ORAL | Status: DC | PRN

## 2017-03-22 MED ORDER — OXYCODONE HCL 5 MG PO TABS: 2.5000 mg | ORAL_TABLET | Freq: Four times a day (QID) | ORAL | 0 refills | Status: DC | PRN

## 2017-03-22 MED ORDER — AMOXICILLIN-POT CLAVULANATE 500-125 MG PO TABS: 1.0000 | ORAL_TABLET | Freq: Two times a day (BID) | ORAL | 0 refills | Status: DC

## 2017-03-22 MED ORDER — IPRATROPIUM-ALBUTEROL 0.5-2.5 (3) MG/3ML IN SOLN: 3.0000 mL | Freq: Four times a day (QID) | RESPIRATORY_TRACT | Status: DC | PRN

## 2017-03-22 NOTE — Discharge Summary (Signed)
[facility] NAME: [name]    MR#:  [id number]  DATE OF BIRTH:  May 20, 1970  DATE OF ADMISSION:  03/18/2017 ADMITTING PHYSICIAN: Amelia Jo, MD  DATE OF DISCHARGE: 03/22/2017  PRIMARY CARE PHYSICIAN: [name], MD    ADMISSION DIAGNOSIS:  Weakness [R53.1] Renal insufficiency [N28.9] Community acquired pneumonia of right lower lobe of lung ([place]) [J18.1] Sepsis ([place]) [A41.9]  DISCHARGE DIAGNOSIS:  Active Problems:   Altered mental status   Sepsis ([place])   SECONDARY DIAGNOSIS:   Past Medical History:  Diagnosis Date  . Anemia    iron deficiency anemia  . Aortic arch aneurysm ([place])   . Bipolar disorder ([place])   . BRCA negative 2014  . Colon polyp   . Family history of breast cancer    BRCA neg 2014  . Gastric ulcer 04/27/2011  . Headache    Migraines  . Malignant melanoma of skin of scalp ([place])   . MI, acute, non ST segment elevation ([place])   . Morbid obesity ([place])    a. s/p gastric bypass.  . Myocardial infarction ([place]) 06/03/2015  . Neuromuscular disorder ([place])   . S/P drug eluting coronary stent placement 06/04/2015    HOSPITAL COURSE:   1.  Acute metabolic encephalopathy.  This has improved.  Likely secondary to high-dose pain medication given after surgery, acute kidney injury, not sleeping well.  Mental status has improved tremendously.  Neurontin dose decreased.  Stop Ambien Zanaflex And long-acting pain medication. 2.  Pneumonia.  Patient was on IV ceftriaxone and Zithromax.  Patient finished Zithromax course during the hospital course.  Patient also had recent urinary tract infection with E. coli which Rocephin would cover.  Patient was switched over to Augmentin for a few more days. 3.  Acute kidney injury on chronic kidney disease stage III.  Baseline creatinine is 1.8.  Patient was given IV fluids during the hospital course.  Creatinine was as high as 4.13.  It did come down to 3.05.  Continue to  hold losartan at this point.  Follow-up with nephrology as outpatient. 4.  Hyperkalemia this was treated with Kayexalate and also Veltassa.  Since creatinine is improving hopefully we will not need any further medications as outpatient. 5.  Type 2 diabetes mellitus.  Metformin is still on hold at this point with acute kidney injury.  Sugars have been acceptable with diet only.  Follow-up with PMD. 6.  Recent right shoulder surgery.  Follow-up with Dr. [name] as outpatient.  Oxycodone 2.5 mg every 6 hours as needed for pain  7.  GERD on PPI 8.  Hypothyroidism unspecified on levothyroxine 9.  Anxiety depression on Wellbutrin and Celexa.  Decrease dose of Xanax also 10.  Essential hypertension on Cardizem CD and metoprolol    DISCHARGE CONDITIONS:   Satisfactory  CONSULTS OBTAINED:  Treatment Team:  Anthonette Legato, MD  DRUG ALLERGIES:   Allergies  Allergen Reactions  . Tramadol     Mouth feels like it's on fire    DISCHARGE MEDICATIONS:   Allergies as of 03/22/2017      Reactions   Tramadol    Mouth feels like it's on fire      Medication List    STOP taking these medications   gabapentin 600 MG tablet Commonly known as:  NEURONTIN Replaced by:  gabapentin 300 MG capsule   losartan 100 MG tablet Commonly known as:  COZAAR   metFORMIN 1000 MG tablet Commonly  known as:  GLUCOPHAGE   morphine 15 MG tablet Commonly known as:  MSIR   propranolol ER 60 MG 24 hr capsule Commonly known as:  INDERAL LA   tiZANidine 4 MG tablet Commonly known as:  ZANAFLEX   zolpidem 5 MG tablet Commonly known as:  AMBIEN     TAKE these medications   acetaminophen 325 MG tablet Commonly known as:  TYLENOL Take 2 tablets (650 mg total) by mouth every 6 (six) hours as needed for mild pain (or Fever >/= 101).   ALPRAZolam 0.5 MG tablet Commonly known as:  XANAX Take 0.5-1 tablets (0.25-0.5 mg total) by mouth every 6 (six) hours as needed for anxiety. What changed:  how much to  take   amoxicillin-clavulanate 500-125 MG tablet Commonly known as:  AUGMENTIN Take 1 tablet (500 mg total) by mouth 2 (two) times daily.   aspirin EC 81 MG tablet Take 1 tablet (81 mg total) by mouth daily.   buPROPion 150 MG 24 hr tablet Commonly known as:  WELLBUTRIN XL Take 1 tablet (150 mg total) by mouth daily.   citalopram 40 MG tablet Commonly known as:  CELEXA Take 1 tablet (40 mg total) by mouth daily.   diltiazem 120 MG 24 hr capsule Commonly known as:  CARDIZEM CD Take 1 capsule (120 mg total) by mouth daily.   ezetimibe 10 MG tablet Commonly known as:  ZETIA Take 1 tablet (10 mg total) by mouth at bedtime.   gabapentin 300 MG capsule Commonly known as:  NEURONTIN Take 1 capsule (300 mg total) by mouth 2 (two) times daily. Replaces:  gabapentin 600 MG tablet   hyoscyamine 0.125 MG SL tablet Commonly known as:  LEVSIN SL Place 1 tablet (0.125 mg total) under the tongue every 6 (six) hours as needed. What changed:  reasons to take this   levothyroxine 25 MCG tablet Commonly known as:  SYNTHROID, LEVOTHROID Take 1 tablet (25 mcg total) daily before breakfast by mouth.   metoprolol succinate 25 MG 24 hr tablet Commonly known as:  TOPROL-XL Take 1 tablet (25 mg total) by mouth daily.   nitroGLYCERIN 0.4 MG SL tablet Commonly known as:  NITROSTAT Place 1 tablet (0.4 mg total) under the tongue every 5 (five) minutes as needed for chest pain.   ONE TOUCH ULTRA 2 w/Device Kit Use to check blood sugar once a day. Dx. E11.9   oxyCODONE 5 MG immediate release tablet Commonly known as:  Oxy IR/ROXICODONE Take 0.5 tablets (2.5 mg total) by mouth every 6 (six) hours as needed for moderate pain or severe pain.   pantoprazole 40 MG tablet Commonly known as:  PROTONIX Take 1 tablet (40 mg total) by mouth 2 (two) times daily.        DISCHARGE INSTRUCTIONS:   Follow-up PMD 1 week Follow-up nephrology 1-2 weeks Follow-up Dr. [name] orthopedic surgery as  surgical follow-up.  If you experience worsening of your admission symptoms, develop shortness of breath, life threatening emergency, suicidal or homicidal thoughts you must seek medical attention immediately by calling 911 or calling your MD immediately  if symptoms less severe.  You Must read complete instructions/literature along with all the possible adverse reactions/side effects for all the Medicines you take and that have been prescribed to you. Take any new Medicines after you have completely understood and accept all the possible adverse reactions/side effects.   Please note  You were cared for by a hospitalist during your hospital stay. If you have any questions about  your discharge medications or the care you received while you were in the hospital after you are discharged, you can call the unit and asked to speak with the hospitalist on call if the hospitalist that took care of you is not available. Once you are discharged, your primary care physician will handle any further medical issues. Please note that NO REFILLS for any discharge medications will be authorized once you are discharged, as it is imperative that you return to your primary care physician (or establish a relationship with a primary care physician if you do not have one) for your aftercare needs so that they can reassess your need for medications and monitor your lab values.    Today   CHIEF COMPLAINT:   Chief Complaint  Patient presents with  . Weakness  . Aphasia    HISTORY OF PRESENT ILLNESS:  [name] [address] [name]  is a 47 y.o. female brought in with altered mental status   VITAL SIGNS:  Blood pressure (!) 178/67, pulse 85, temperature 99 F (37.2 C), temperature source Oral, resp. rate 18, height '5\' 2"'  (1.575 m), weight 80.8 kg (178 lb 2.1 oz), SpO2 95 %.   PHYSICAL EXAMINATION:  GENERAL:  47 y.o.-year-old patient lying in the bed with no acute distress.  EYES: Pupils equal, round, reactive to light and  accommodation. No scleral icterus. Extraocular muscles intact.  HEENT: Head atraumatic, normocephalic. Oropharynx and nasopharynx clear.  NECK:  Supple, no jugular venous distention. No thyroid enlargement, no tenderness.  LUNGS: Decreased breath sounds bilateral bases, no wheezing, rales,rhonchi or crepitation. No use of accessory muscles of respiration.  CARDIOVASCULAR: S1, S2 normal. No murmurs, rubs, or gallops.  ABDOMEN: Soft, non-tender, non-distended. Bowel sounds present. No organomegaly or mass.  EXTREMITIES:  trace edema, no cyanosis, or clubbing.  NEUROLOGIC: Cranial nerves II through XII are intact. Muscle strength 5/5 in all extremities. Sensation intact. Gait not checked.  PSYCHIATRIC: The patient is alert and oriented x 3.  SKIN: No obvious rash, lesion, or ulcer.   DATA REVIEW:   CBC Recent Labs  Lab 03/20/17 0444  WBC 16.0*  HGB 10.5*  HCT 32.2*  PLT 185    Chemistries  Recent Labs  Lab 03/19/17 0522  03/22/17 0256  NA 137   < > 143  K 5.7*   < > 3.4*  CL 113*   < > 113*  CO2 16*   < > 22  GLUCOSE 192*   < > 153*  BUN 33*   < > 38*  CREATININE 3.89*   < > 3.05*  CALCIUM 8.2*   < > 8.5*  MG 2.3  --   --    < > = values in this interval not displayed.    Cardiac Enzymes No results for input(s): TROPONINI in the last 168 hours.  Microbiology Results  Results for orders placed or performed during the hospital encounter of 03/18/17  Blood Culture (routine x 2)     Status: None (Preliminary result)   Collection Time: 03/18/17  8:08 PM  Result Value Ref Range Status   Specimen Description BLOOD BLOOD LEFT HAND  Final   Special Requests   Final    BOTTLES DRAWN AEROBIC AND ANAEROBIC Blood Culture adequate volume   Culture   Final    NO GROWTH 4 DAYS Performed at [facility], [address], [place], [place] [address]    Report Status PENDING  Incomplete  Blood Culture (routine x 2)     Status:  None (Preliminary result)   Collection Time:  03/18/17  8:08 PM  Result Value Ref Range Status   Specimen Description BLOOD LEFT ANTECUBITAL  Final   Special Requests   Final    BOTTLES DRAWN AEROBIC AND ANAEROBIC Blood Culture results may not be optimal due to an excessive volume of blood received in culture bottles   Culture   Final    NO GROWTH 4 DAYS Performed at [facility], [address], [place], [place] [address]    Report Status PENDING  Incomplete  Urine culture     Status: None   Collection Time: 03/18/17  8:08 PM  Result Value Ref Range Status   Specimen Description   Final    URINE, RANDOM Performed at [facility], [address] [place], [place] [address]    Special Requests   Final    NONE Performed at [facility], [address] [place], [place] [address]    Culture   Final    NO GROWTH Performed at [facility], [place] [address] [place], [place] [address]    Report Status 03/20/2017 FINAL  Final  MRSA PCR Screening     Status: None   Collection Time: 03/18/17 11:50 PM  Result Value Ref Range Status   MRSA by PCR NEGATIVE NEGATIVE Final    Comment:        The GeneXpert MRSA Assay (FDA approved for NASAL specimens only), is one component of a comprehensive MRSA colonization surveillance program. It is not intended to diagnose MRSA infection nor to guide or monitor treatment for MRSA infections. Performed at [facility], [address], [place], [place] [address]      Management plans discussed with the patient, and she is in agreement.  CODE STATUS:     Code Status Orders  (From admission, onward)        Start     Ordered   03/18/17 2348  Full code  Continuous     03/18/17 2348    Code Status History    Date Active Date Inactive Code Status Order ID Comments User Context   03/16/2017 15:39 03/16/2017 19:37 Full Code [id number]  Corky Mull, MD Inpatient   01/27/2017 19:34 01/29/2017 18:39 Full Code [id number]  Gorden Harms, MD  Inpatient   01/07/2016 16:30 01/07/2016 20:16 Full Code [id number]  Corky Mull, MD Inpatient   12/14/2015 02:39 12/14/2015 17:33 Full Code [id number]  Harrie Foreman, MD ED   11/07/2015 15:21 11/08/2015 19:20 Full Code [id number]  Baxter Hire, MD Inpatient   10/28/2015 16:04 11/02/2015 15:04 Full Code [id number]  Henreitta Leber, MD Inpatient   06/03/2015 15:10 06/05/2015 15:01 Full Code [id number]  Fritzi Mandes, MD Inpatient   04/17/2015 21:59 04/18/2015 20:45 Full Code [id number]  Vaughan Basta, MD ED   02/15/2015 20:04 02/20/2015 19:03 Full Code [id number]  Gonzella Lex, MD Inpatient   11/12/2014 08:33 11/13/2014 14:49 Full Code [id number]  Isaias Cowman, MD Inpatient   11/07/2014 21:53 11/12/2014 08:33 Full Code [id number]  Henreitta Leber, MD Inpatient      TOTAL TIME TAKING CARE OF THIS PATIENT: 35 minutes.    Loletha Grayer M.D on 03/22/2017 at 2:57 PM  Between 7am to 6pm - Pager - [phone]  After 6pm go to [email or web address] - password [email or web address]  [employer] Office  [phone]  CC: Primary care physician; [name], MD

## 2017-03-22 NOTE — Discharge Instructions (Signed)
Acute Kidney Injury, Adult Acute kidney injury is a sudden worsening of kidney function. The kidneys are organs that have several jobs. They filter the blood to remove waste products and extra fluid. They also maintain a healthy balance of minerals and hormones in the body, which helps control blood pressure and keep bones strong. With this condition, your kidneys do not do their jobs as well as they should. This condition ranges from mild to severe. Over time it may develop into long-lasting (chronic) kidney disease. Early detection and treatment may prevent acute kidney injury from developing into a chronic condition. What are the causes? Common causes of this condition include:  A problem with blood flow to the kidneys. This may be caused by: ? Low blood pressure (hypotension) or shock. ? Blood loss. ? Heart and blood vessel (cardiovascular) disease. ? Severe burns. ? Liver disease.  Direct damage to the kidneys. This may be caused by: ? Certain medicines. ? A kidney infection. ? Poisoning. ? Being around or in contact with toxic substances. ? A surgical wound. ? A hard, direct hit to the kidney area.  A sudden blockage of urine flow. This may be caused by: ? Cancer. ? Kidney stones. ? An enlarged prostate in males.  What are the signs or symptoms? Symptoms of this condition may not be obvious until the condition becomes severe. Symptoms of this condition can include:  Tiredness (lethargy), or difficulty staying awake.  Nausea or vomiting.  Swelling (edema) of the face, legs, ankles, or feet.  Problems with urination, such as: ? Abdominal pain, or pain along the side of your stomach (flank). ? Decreased urine production. ? Decrease in the force of urine flow.  Muscle twitches and cramps, especially in the legs.  Confusion or trouble concentrating.  Loss of appetite.  Fever.  How is this diagnosed? This condition may be diagnosed with tests, including:  Blood  tests.  Urine tests.  Imaging tests.  A test in which a sample of tissue is removed from the kidneys to be examined under a microscope (kidney biopsy).  How is this treated? Treatment for this condition depends on the cause and how severe the condition is. In mild cases, treatment may not be needed. The kidneys may heal on their own. In more severe cases, treatment will involve:  Treating the cause of the kidney injury. This may involve changing any medicines you are taking or adjusting your dosage.  Fluids. You may need specialized IV fluids to balance your body's needs.  Having a catheter placed to drain urine and prevent blockages.  Preventing problems from occurring. This may mean avoiding certain medicines or procedures that can cause further injury to the kidneys.  In some cases treatment may also require:  A procedure to remove toxic wastes from the body (dialysis or continuous renal replacement therapy - CRRT).  Surgery. This may be done to repair a torn kidney, or to remove the blockage from the urinary system.  Follow these instructions at home: Medicines  Take over-the-counter and prescription medicines only as told by your health care provider.  Do not take any new medicines without your health care provider's approval. Many medicines can worsen your kidney damage.  Do not take any vitamin and mineral supplements without your health care provider's approval. Many nutritional supplements can worsen your kidney damage. Lifestyle  If your health care provider prescribed changes to your diet, follow them. You may need to decrease the amount of protein you eat.  Achieve   and maintain a healthy weight. If you need help with this, ask your health care provider.  Start or continue an exercise plan. Try to exercise at least 30 minutes a day, 5 days a week.  Do not use any tobacco products, such as cigarettes, chewing tobacco, and e-cigarettes. If you need help quitting, ask  your health care provider. General instructions  Keep track of your blood pressure. Report changes in your blood pressure as told by your health care provider.  Stay up to date with immunizations. Ask your health care provider which immunizations you need.  Keep all follow-up visits as told by your health care provider. This is important. Where to find more information:  American Association of Kidney Patients: www.aakp.org  National Kidney Foundation: www.kidney.org  American Kidney Fund: www.akfinc.org  Life Options Rehabilitation Program: ? www.lifeoptions.org ? www.kidneyschool.org Contact a health care provider if:  Your symptoms get worse.  You develop new symptoms. Get help right away if:  You develop symptoms of worsening kidney disease, which include: ? Headaches. ? Abnormally dark or light skin. ? Easy bruising. ? Frequent hiccups. ? Chest pain. ? Shortness of breath. ? End of menstruation in women. ? Seizures. ? Confusion or altered mental status. ? Abdominal or back pain. ? Itchiness.  You have a fever.  Your body is producing less urine.  You have pain or bleeding when you urinate. Summary  Acute kidney injury is a sudden worsening of kidney function.  Acute kidney injury can be caused by problems with blood flow to the kidneys, direct damage to the kidneys, and sudden blockage of urine flow.  Symptoms of this condition may not be obvious until it becomes severe. Symptoms may include edema, lethargy, confusion, nausea or vomiting, and problems passing urine.  This condition can usually be diagnosed with blood tests, urine tests, and imaging tests. Sometimes a kidney biopsy is done to diagnose this condition.  Treatment for this condition often involves treating the underlying cause. It is treated with fluids, medicines, dialysis, diet changes, or surgery. This information is not intended to replace advice given to you by your health care provider.  Make sure you discuss any questions you have with your health care provider. Document Released: 08/04/2010 Document Revised: 05/21/2016 Document Reviewed: 01/10/2016 Elsevier Interactive Patient Education  2018 Elsevier Inc.  

## 2017-03-22 NOTE — Progress Notes (Signed)
BP 1190/77. MD notified, new order. obtained.

## 2017-03-22 NOTE — Care Management Note (Signed)
Case Management Note  Patient Details  Name: [name] MRN: [id number] Date of Birth: October 29, 1970  Subjective/Objective:                   RNCM spoke with patient regarding transition of care to home today. [facility] would [facility].  She can get a cane from family as recommended by PT.  She lives with a roommate and has transportation .  A ction/Plan:  Referral to [facility] per patient request.  No other RNCM needs.   Expected Discharge Date:  03/22/17               Expected Discharge Plan:     In-House Referral:     Discharge planning Services     Post Acute Care Choice:  Home Health Choice offered to:  Patient  DME Arranged:    DME Agency:     HH Arranged:  PT, OT [place] Agency:  [facility]  Status of Service:  Completed, signed off  If discussed at [place] of Stay Meetings, dates discussed:    Additional Comments:  Marshell Garfinkel, RN 03/22/2017, 12:24 PM

## 2017-03-22 NOTE — Care Management Important Message (Signed)
Important Message  Patient Details  Name: [name] MRN: [id number] Date of Birth: 1970/10/31   Medicare Important Message Given:  Yes    Marshell Garfinkel, RN 03/22/2017, 7:35 AM

## 2017-03-22 NOTE — Progress Notes (Signed)
Discharge instructions with verbal understanding. #Rxs given upon discharge. Immobilizer in place.

## 2017-03-22 NOTE — Progress Notes (Signed)
[facility]  ROUNDING NOTE   Subjective:  Urine output was 2800 cc over the preceding 24 hours. Creatinine down to 3.05 today. Appetite is fair No nausea or vomiting reported  Objective:  Vital signs in last 24 hours:  Temp:  [98.1 F (36.7 C)-99.4 F (37.4 C)] 99 F (37.2 C) (02/18 0805) Pulse Rate:  [85-105] 85 (02/18 0805) Resp:  [18] 18 (02/17 1631) BP: (149-190)/(67-96) 178/67 (02/18 0805) SpO2:  [91 %-95 %] 95 % (02/18 0805)  Weight change:  Filed Weights   03/18/17 1651 03/18/17 2357  Weight: 74.4 kg (164 lb) 80.8 kg (178 lb 2.1 oz)    Intake/Output: I/O last 3 completed shifts: In: 840 [P.O.:840] Out: 4050 [Urine:4050]   Intake/Output this shift:  Total I/O In: 720 [P.O.:720] Out: -   Physical Exam: General: No acute distress  Head: Normocephalic, atraumatic. Moist oral mucosal membranes  Eyes: Anicteric  Neck: Supple, trachea midline  Lungs:  Clear to auscultation, normal effort  Heart: S1S2 no rubs  Abdomen:  Soft, nontender, bowel sounds present  Extremities: trace peripheral edema.  Neurologic: Awake, alert, follows commands  Skin: No lesions       Basic Metabolic Panel: Recent Labs  Lab 03/19/17 0035 03/19/17 0522 03/19/17 0807 03/20/17 0444 03/20/17 1520 03/21/17 0504 03/22/17 0256  NA 134* 137  --  136  --  141 143  K 5.4* 5.7* 5.2* 5.9* 5.8* 4.0 3.4*  CL 109 113*  --  107  --  114* 113*  CO2 16* 16*  --  18*  --  19* 22  GLUCOSE 155* 192*  --  159*  --  92 153*  BUN 33* 33*  --  40*  --  43* 38*  CREATININE 3.76* 3.89*  --  4.09*  --  3.82* 3.05*  CALCIUM 7.3* 8.2*  --  8.3*  --  8.1* 8.5*  MG 1.2* 2.3  --   --   --   --   --   PHOS 7.3* 7.1*  --  6.8*  --   --   --     Liver Function Tests: Recent Labs  Lab 03/20/17 0444  ALBUMIN 3.0*   No results for input(s): LIPASE, AMYLASE in the last 168 hours. No results for input(s): AMMONIA in the last 168 hours.  CBC: Recent Labs  Lab 03/18/17 1702  03/19/17 0522 03/20/17 0444  WBC 21.7* 19.9* 16.0*  NEUTROABS  --   --  14.5*  HGB 13.0 11.4* 10.5*  HCT 39.9 34.8* 32.2*  MCV 84.2 84.6 83.7  PLT 254 176 185    Cardiac Enzymes: No results for input(s): CKTOTAL, CKMB, CKMBINDEX, TROPONINI in the last 168 hours.  BNP: Invalid input(s): POCBNP  CBG: Recent Labs  Lab 03/21/17 1705 03/21/17 2103 03/22/17 0400 03/22/17 0747 03/22/17 1139  GLUCAP 136* 154* 147* 151* 154*    Microbiology: Results for orders placed or performed during the hospital encounter of 03/18/17  Blood Culture (routine x 2)     Status: None (Preliminary result)   Collection Time: 03/18/17  8:08 PM  Result Value Ref Range Status   Specimen Description BLOOD BLOOD LEFT HAND  Final   Special Requests   Final    BOTTLES DRAWN AEROBIC AND ANAEROBIC Blood Culture adequate volume   Culture   Final    NO GROWTH 4 DAYS Performed at [facility], [address] [place], [place] [address]    Report Status PENDING  Incomplete  Blood Culture (  routine x 2)     Status: None (Preliminary result)   Collection Time: 03/18/17  8:08 PM  Result Value Ref Range Status   Specimen Description BLOOD LEFT ANTECUBITAL  Final   Special Requests   Final    BOTTLES DRAWN AEROBIC AND ANAEROBIC Blood Culture results may not be optimal due to an excessive volume of blood received in culture bottles   Culture   Final    NO GROWTH 4 DAYS Performed at [facility], [address], [place], [place] [address]    Report Status PENDING  Incomplete  Urine culture     Status: None   Collection Time: 03/18/17  8:08 PM  Result Value Ref Range Status   Specimen Description   Final    URINE, RANDOM Performed at [facility], [address] [place], [place] [address]    Special Requests   Final    NONE Performed at [facility], [address] [name], [place] [address]    Culture   Final    NO GROWTH Performed at [facility],  [place] [address] [place], [place] [address]    Report Status 03/20/2017 FINAL  Final  MRSA PCR Screening     Status: None   Collection Time: 03/18/17 11:50 PM  Result Value Ref Range Status   MRSA by PCR NEGATIVE NEGATIVE Final    Comment:        The GeneXpert MRSA Assay (FDA approved for NASAL specimens only), is one component of a comprehensive MRSA colonization surveillance program. It is not intended to diagnose MRSA infection nor to guide or monitor treatment for MRSA infections. Performed at [facility], [address] [place],  [address]     Coagulation Studies: No results for input(s): LABPROT, INR in the last 72 hours.  Urinalysis: No results for input(s): COLORURINE, LABSPEC, PHURINE, GLUCOSEU, HGBUR, BILIRUBINUR, KETONESUR, PROTEINUR, UROBILINOGEN, NITRITE, LEUKOCYTESUR in the last 72 hours.  Invalid input(s): APPERANCEUR    Imaging: No results found.   Medications:    . amoxicillin-clavulanate  1 tablet Oral BID  . aspirin EC  81 mg Oral Daily  . azithromycin  250 mg Oral Daily  . budesonide (PULMICORT) nebulizer solution  0.5 mg Nebulization BID  . buPROPion  150 mg Oral Daily  . citalopram  40 mg Oral Daily  . diltiazem  120 mg Oral Daily  . ezetimibe  10 mg Oral QHS  . gabapentin  300 mg Oral BID  . heparin  5,000 Units Subcutaneous Q8H  . insulin aspart  0-5 Units Subcutaneous QHS  . insulin aspart  0-9 Units Subcutaneous TID WC  . ipratropium-albuterol  3 mL Nebulization BID  . levothyroxine  25 mcg Oral QAC breakfast  . mouth rinse  15 mL Mouth Rinse BID  . metoprolol succinate  25 mg Oral Daily  . pantoprazole  40 mg Oral BID  . patiromer  8.4 g Oral Daily  . polyethylene glycol  17 g Oral Daily  . traZODone  50 mg Oral QHS   acetaminophen **OR** acetaminophen, ALPRAZolam, hydrALAZINE, hyoscyamine, ipratropium-albuterol, ondansetron **OR** ondansetron (ZOFRAN) IV, oxyCODONE  Assessment/ Plan:  47 y.o. female past medical  history hypertension, diabetes mellitus for over 20 years, diabetic retinopathy, history of CVA, gastric ulcer, colonic polyps, hyperlipidemia, gastric bypass performed [facility], CVA with mild L sided weakness 2015, carotild artery bypass and depression    1.  Acute renal failure due to hypotension. 2.  Chronic kidney  disease stage III baseline creatinine 1.8. 3.  Hyperkalemia. 4.  Hypotension.  Plan: The patient's creatinine continues to improve.  Currently it is down to 3.05.  Urine output of 2.8 L over the preceding 24 hours noted.  continue supportive care and avoid nephrotoxins and hypotension as possible. Patient is scheduled for outpatient follow up next Monday   LOS: 4 [name] 2/18/20194:08 PM

## 2017-03-23 ENCOUNTER — Telehealth: Payer: Self-pay

## 2017-03-23 LAB — CULTURE, BLOOD (ROUTINE X 2)
CULTURE: NO GROWTH
Culture: NO GROWTH
Special Requests: ADEQUATE

## 2017-03-23 NOTE — Telephone Encounter (Signed)
Transition Care Management Follow-Up Telephone Call   Date discharged and where: [facility] on 03/22/17.  How have you been since you were released from the hospital? Doing better, still coughing with deep breaths but overall better. Denies fever, sputum or n/v/d. Still in pain from right shoulder sx on 03/16/17. Pain level currently 10 due to just getting dressed. Prescribed oxycodone for pain, but pt is trying to avoid taking these.  Any patient concerns? None.  Items Reviewed:   Meds: verified  Allergies: verified  Dietary Changes Reviewed: n/a  Functional Questionnaire:  Independent-I Dependent-D  ADLs:   Dressing- I    Eating- I   Maintaining continence- I   Transferring- I   Transportation- I   Meal Prep- I   Managing Meds- I  Confirmed importance and Date/Time of follow-up visits scheduled: 03/31/17 @ 4:00 PM.   Confirmed with patient if condition worsens to call PCP or go to the Emergency Dept. Patient was given office number and encouraged to call back with questions or concerns: YES

## 2017-03-26 ENCOUNTER — Telehealth: Payer: Self-pay

## 2017-03-26 NOTE — Telephone Encounter (Signed)
EMMI Follow-up: Contacted patient and asked if she had any questions regarding her discharge.  She noted no concerns at this time.

## 2017-03-30 IMAGING — CR DG CHEST 2V
2 series · 2 of 2 positions shown · non-contrast
Comparison: Chest radiograph performed 02/16/2015

CLINICAL DATA: Acute onset of generalized chest pain, radiating to
the left arm. Initial encounter.

EXAM:
CHEST  2 VIEW

[chest pa]
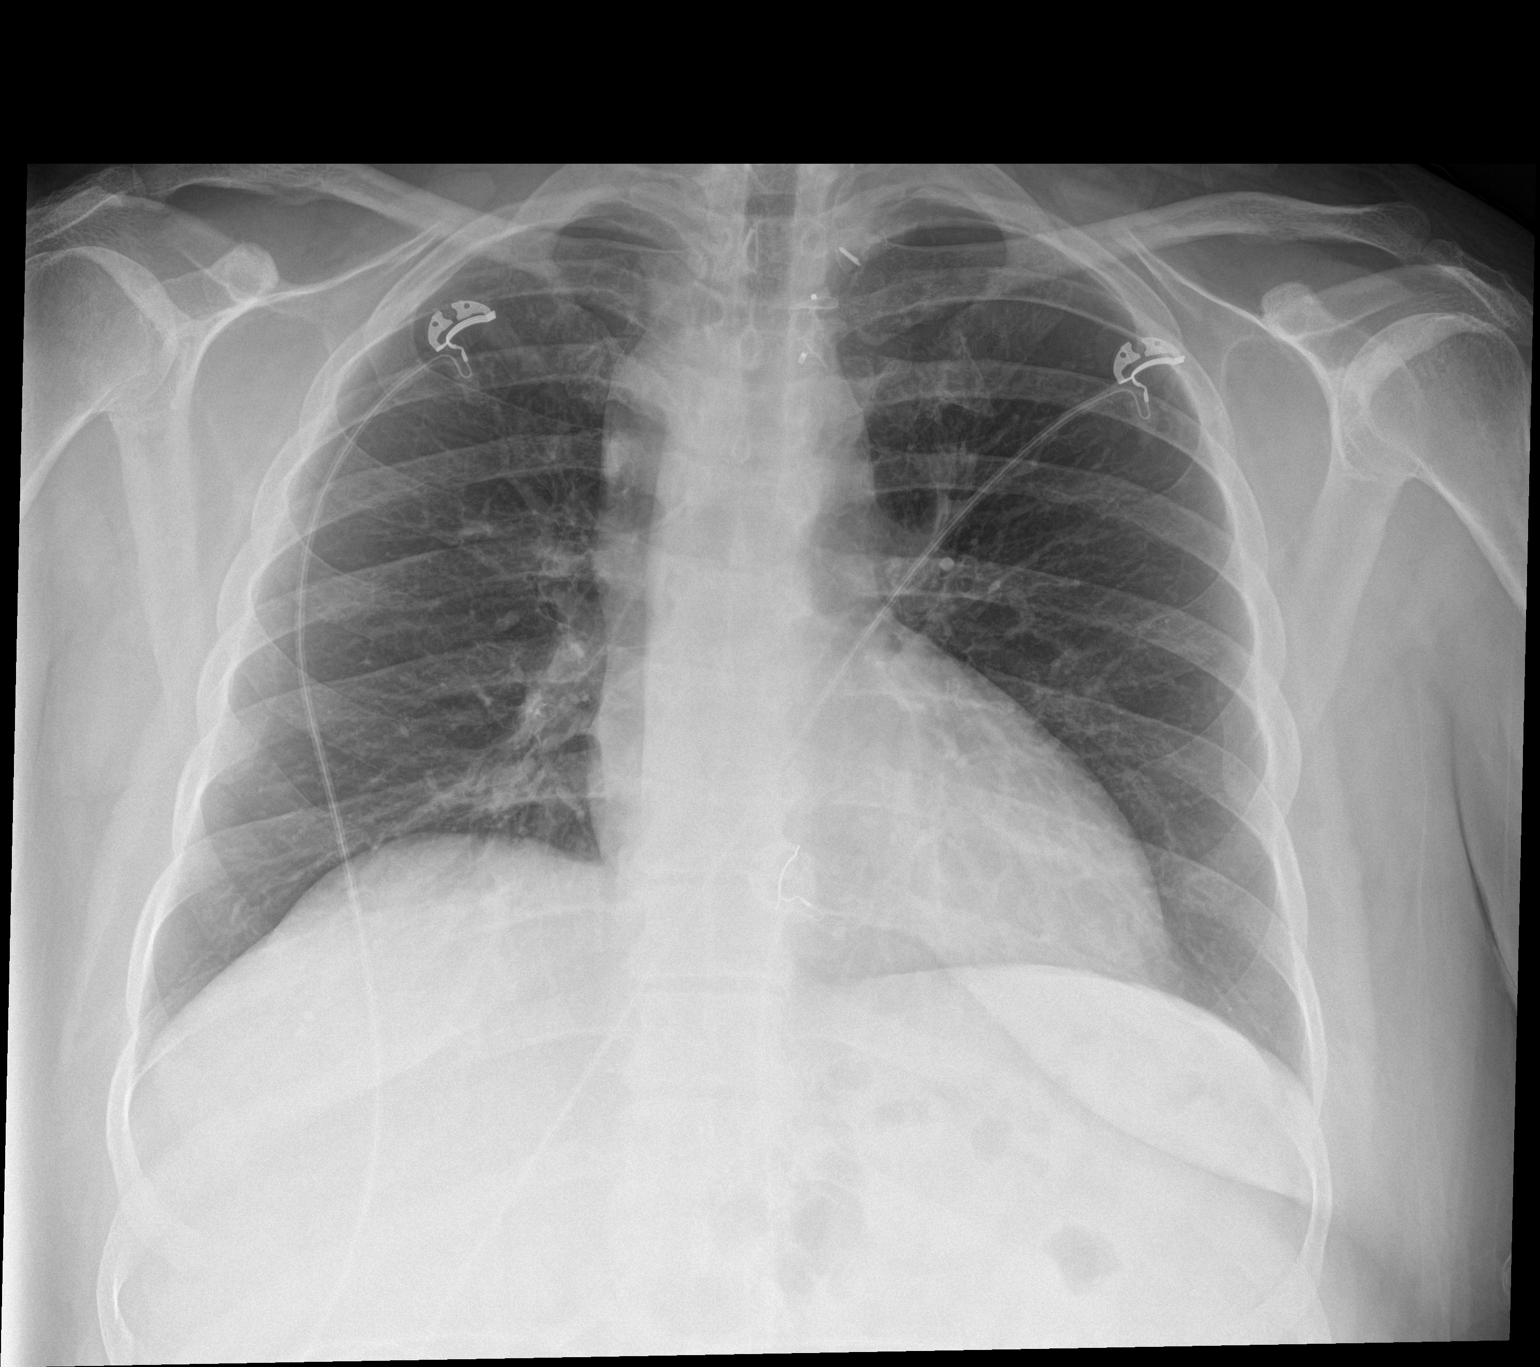

[chest lat]
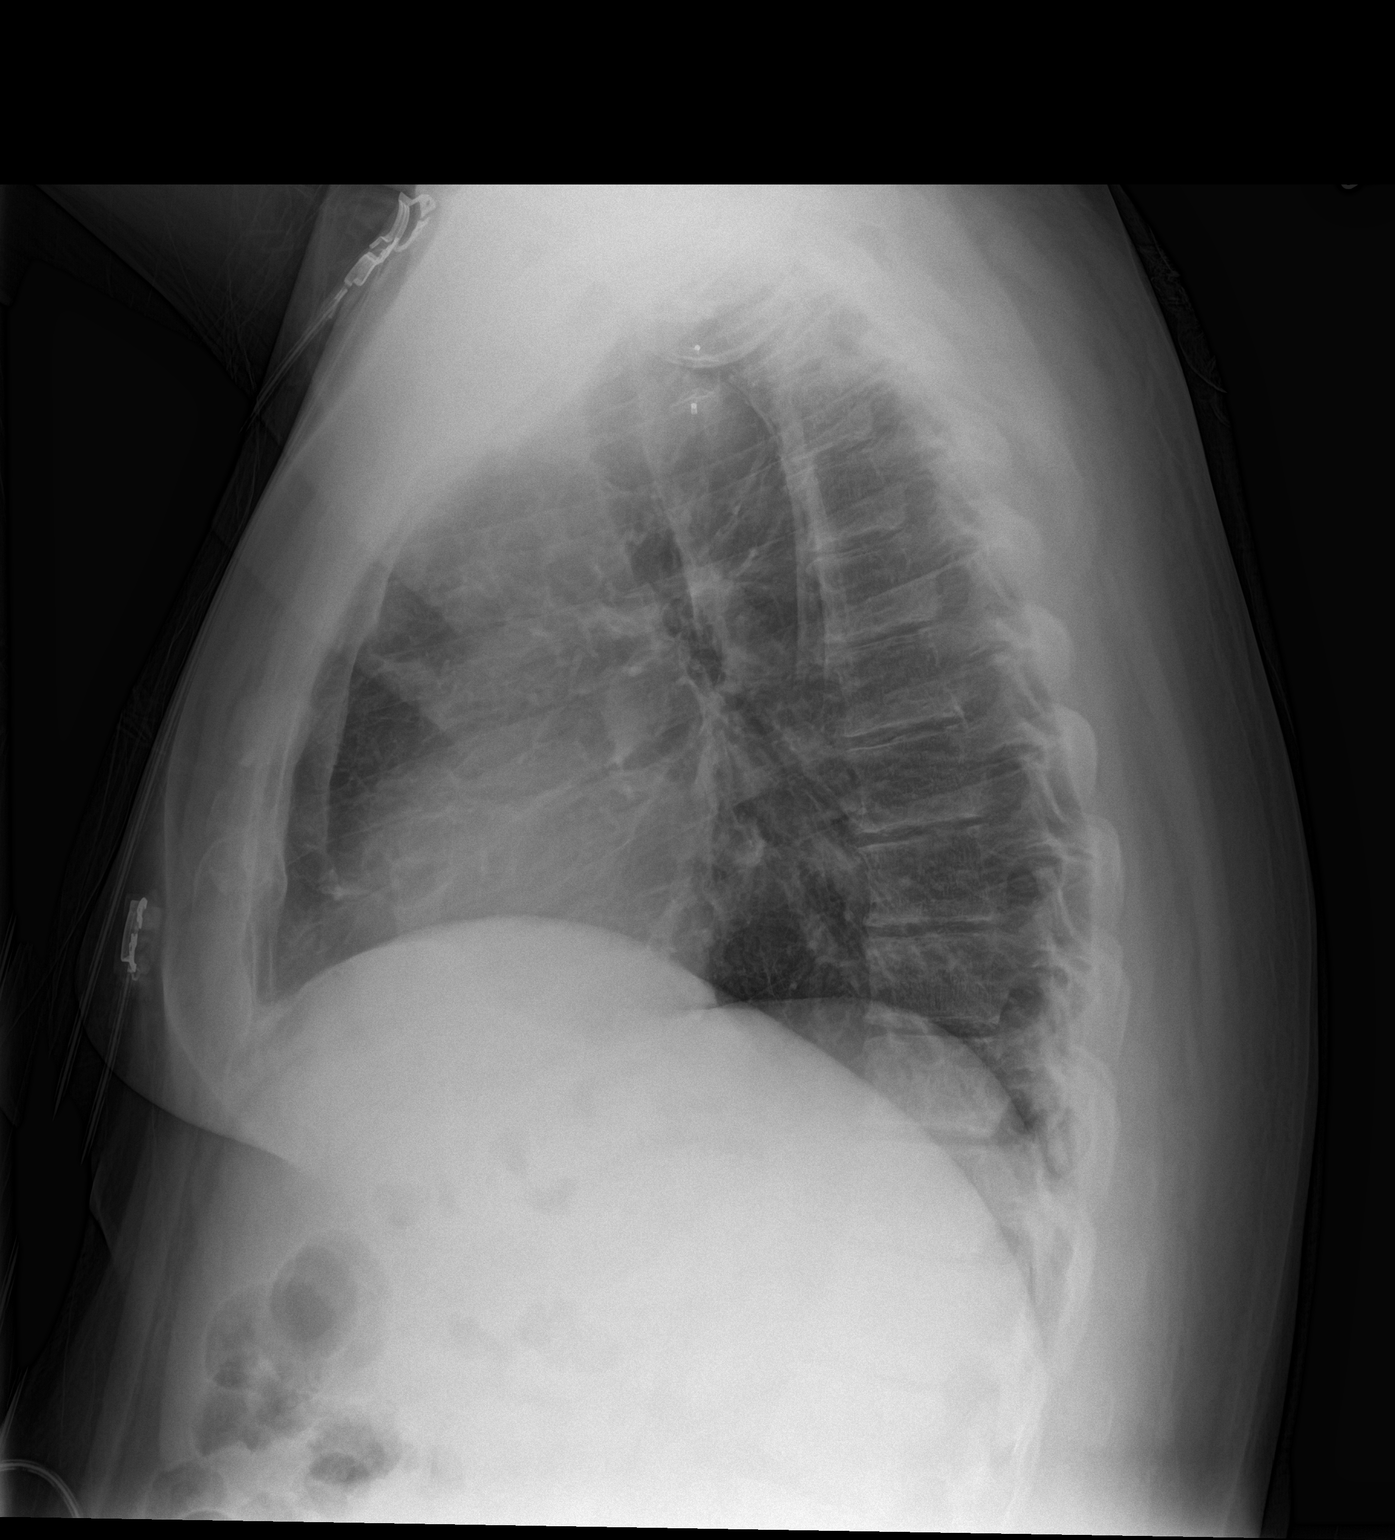

[2 of 2 positions shown; findings below may reference images not displayed]

FINDINGS: The lungs are well-aerated. Mild peribronchial thickening is noted.
There is no evidence of focal opacification, pleural effusion or
pneumothorax.

The heart is normal in size; the mediastinal contour is within
normal limits. Postoperative change is noted about the left side of
the superior mediastinum. No acute osseous abnormalities are seen.
IMPRESSION: Mild peribronchial thickening noted.  Lungs otherwise clear

## 2017-03-31 ENCOUNTER — Encounter: Payer: Self-pay | Admitting: Family Medicine

## 2017-03-31 ENCOUNTER — Ambulatory Visit (INDEPENDENT_AMBULATORY_CARE_PROVIDER_SITE_OTHER): Payer: Medicare Other | Admitting: Family Medicine

## 2017-03-31 ENCOUNTER — Telehealth: Payer: Self-pay | Admitting: Family Medicine

## 2017-03-31 VITALS — BP 178/100 | HR 65 | Temp 98.6°F | Resp 16 | Wt 172.0 lb

## 2017-03-31 DIAGNOSIS — I10 Essential (primary) hypertension: Secondary | ICD-10-CM

## 2017-03-31 DIAGNOSIS — E118 Type 2 diabetes mellitus with unspecified complications: Secondary | ICD-10-CM

## 2017-03-31 DIAGNOSIS — J189 Pneumonia, unspecified organism: Secondary | ICD-10-CM | POA: Diagnosis not present

## 2017-03-31 DIAGNOSIS — R410 Disorientation, unspecified: Secondary | ICD-10-CM

## 2017-03-31 DIAGNOSIS — N183 Chronic kidney disease, stage 3 (moderate): Secondary | ICD-10-CM | POA: Diagnosis not present

## 2017-03-31 DIAGNOSIS — E559 Vitamin D deficiency, unspecified: Secondary | ICD-10-CM | POA: Diagnosis not present

## 2017-03-31 DIAGNOSIS — N179 Acute kidney failure, unspecified: Secondary | ICD-10-CM | POA: Diagnosis not present

## 2017-03-31 MED ORDER — GLIPIZIDE ER 2.5 MG PO TB24: 2.5000 mg | ORAL_TABLET | Freq: Every day | ORAL | 1 refills | Status: DC

## 2017-03-31 NOTE — Telephone Encounter (Signed)
Patient had very high blood pressure in offce on 03/31/2017. Can you please contact Dr. Elwyn Lade office for results of labs he checked on 03/31/17 to see if we can get her back on losartan. Thanks.

## 2017-03-31 NOTE — Progress Notes (Signed)
Patient: [name] Female    DOB: March 15, 1970   47 y.o.   MRN: [id number] Visit Date: 03/31/2017  Today's Provider: [name], MD   Chief Complaint  Patient presents with  . Hospitalization Follow-up   Subjective:    HPI  Follow up Hospitalization  Patient was admitted to [facility] on 03/18/2017 and discharged on 03/23/2017. She was treated for weakness, Community Acquired Pneumonia of right lower lobe and sepsis.. Treatment for this included giving patient IV Rocephin and Zithromax. Upon discharge, patient was switched to Augmentin for a few more days. Several medications were placed in home due to Acute Kidney injury and Acute Metabolic Encephalopathy. Per discharge summary, patient is to follow up with Nephrology in 1-2 weeks, and follow up with Dr. [name] orthopedic surgery as a surgical follow up. Telephone follow up was done on 03/23/2017 She reports good compliance with treatment. She reports this condition is Improved. Patient was seen by Dr. Holley Raring this morning Losartan and metformin were stopped during hospitalization due to acute kidney injury. She states her blood sugars have been running in the upper 100s and low 200s since discharge.  Denies any cough or dyspnea.  ------------------------------------------------------------------------------------       Allergies  Allergen Reactions  . Tramadol     Mouth feels like it's on fire     Current Outpatient Medications:  .  acetaminophen (TYLENOL) 325 MG tablet, Take 2 tablets (650 mg total) by mouth every 6 (six) hours as needed for mild pain (or Fever >/= 101)., Disp: , Rfl:  .  ALPRAZolam (XANAX) 0.5 MG tablet, Take 0.5-1 tablets (0.25-0.5 mg total) by mouth every 6 (six) hours as needed for anxiety., Disp: 60 tablet, Rfl: 5 .  aspirin EC 81 MG tablet, Take 1 tablet (81 mg total) by mouth daily., Disp: , Rfl:  .  Blood Glucose Monitoring Suppl (ONE TOUCH ULTRA 2) w/Device KIT, Use to check blood sugar  once a day. Dx. E11.9, Disp: 1 each, Rfl: 0 .  buPROPion (WELLBUTRIN XL) 150 MG 24 hr tablet, Take 1 tablet (150 mg total) by mouth daily., Disp: 30 tablet, Rfl: 1 .  citalopram (CELEXA) 40 MG tablet, Take 1 tablet (40 mg total) by mouth daily., Disp: 30 tablet, Rfl: 11 .  diltiazem (CARDIZEM CD) 120 MG 24 hr capsule, Take 1 capsule (120 mg total) by mouth daily., Disp: 90 capsule, Rfl: 3 .  ezetimibe (ZETIA) 10 MG tablet, Take 1 tablet (10 mg total) by mouth at bedtime., Disp: , Rfl:  .  gabapentin (NEURONTIN) 300 MG capsule, Take 1 capsule (300 mg total) by mouth 2 (two) times daily., Disp: 30 capsule, Rfl: 0 .  hyoscyamine (LEVSIN SL) 0.125 MG SL tablet, Place 1 tablet (0.125 mg total) under the tongue every 6 (six) hours as needed. (Patient taking differently: Place 0.125 mg under the tongue every 6 (six) hours as needed for cramping. ), Disp: 60 tablet, Rfl: 0 .  levothyroxine (SYNTHROID, LEVOTHROID) 25 MCG tablet, Take 1 tablet (25 mcg total) daily before breakfast by mouth., Disp: 30 tablet, Rfl: 12 .  metoprolol succinate (TOPROL-XL) 25 MG 24 hr tablet, Take 1 tablet (25 mg total) by mouth daily., Disp: 30 tablet, Rfl: 0 .  nitroGLYCERIN (NITROSTAT) 0.4 MG SL tablet, Place 1 tablet (0.4 mg total) under the tongue every 5 (five) minutes as needed for chest pain., Disp: 25 tablet, Rfl: 6 .  oxyCODONE (OXY IR/ROXICODONE) 5 MG immediate release tablet, Take 0.5  tablets (2.5 mg total) by mouth every 6 (six) hours as needed for moderate pain or severe pain., Disp: 20 tablet, Rfl: 0 .  pantoprazole (PROTONIX) 40 MG tablet, Take 1 tablet (40 mg total) by mouth 2 (two) times daily., Disp: 60 tablet, Rfl: 5 .  amoxicillin-clavulanate (AUGMENTIN) 500-125 MG tablet, Take 1 tablet (500 mg total) by mouth 2 (two) times daily. (Patient not taking: Reported on 03/31/2017), Disp: 7 tablet, Rfl: 0  Review of Systems  Constitutional: Negative for appetite change, chills, fatigue and fever.  Respiratory: Negative  for chest tightness and shortness of breath.   Cardiovascular: Positive for leg swelling. Negative for chest pain and palpitations.  Gastrointestinal: Negative for abdominal pain, nausea and vomiting.  Musculoskeletal: Positive for arthralgias (right arm pain).  Neurological: Positive for headaches. Negative for dizziness and weakness.  Psychiatric/Behavioral: Positive for sleep disturbance.    Social History   Tobacco Use  . Smoking status: Former Smoker    Types: Cigarettes    Last attempt to quit: 08/31/1994    Years since quitting: 22.5  . Smokeless tobacco: Never Used  . Tobacco comment: quit 28 years ago  Substance Use Topics  . Alcohol use: No    Alcohol/week: 0.0 oz   Objective:   BP (!) 180/110 (BP Location: Left Arm, Patient Position: Sitting, Cuff Size: Normal)   Pulse 65   Temp 98.6 F (37 C) (Oral)   Resp 16   Wt 172 lb (78 kg)   SpO2 98% Comment: room air  BMI 31.46 kg/m  Vitals:   03/31/17 1602 03/31/17 1635  BP: (!) 180/110 (!) 178/100  Pulse: 65   Resp: 16   Temp: 98.6 F (37 C)   TempSrc: Oral   SpO2: 98%   Weight: 172 lb (78 kg)      Physical Exam   General Appearance:    Alert, cooperative, no distress  Eyes:    PERRL, conjunctiva/corneas clear, EOM's intact       Lungs:     Clear to auscultation bilaterally, respirations unlabored  Heart:    Regular rate and rhythm  Neurologic:   Awake, alert, oriented x 3. No apparent focal neurological           defect.           Assessment & Plan:     1. Essential hypertension Much higher since stopping losartan, she is to take an extra 1/2 tablet metoprolol tonight. Will try to get labs from Dr. Holley Raring in the morning, if kidney functions have normalized will resume losartan.   2. Type 2 diabetes mellitus with complication, without long-term current use of insulin (HCC) Worse since stopping metformin. Will start back on  glipiZIDE (GLUCOTROL XL) 2.5 MG 24 hr tablet; Take 1 tablet (2.5 mg total) by  mouth daily with breakfast.  Dispense: 30 tablet; Refill: 1  3. Delirium Multifactorial due to high dose morphine for post surgical pain, pneumonia, and dehydration. Now back to her baseline.   4. Acute kidney injury ([place]) Likely secondary to dehydration. Off of losartan and metformin for now.   5. Pneumonia due to infectious organism, unspecified laterality, unspecified part of lung Clinically resolved.        [name], MD  [facility]

## 2017-03-31 NOTE — Patient Instructions (Signed)
   Please take an extra 1/2 tablet of metoprolol today.

## 2017-04-01 NOTE — Telephone Encounter (Signed)
Called Dr. Elwyn Lade office and requested lab results. Dr. Elwyn Lade nurse stated that when the results come back they will fax them to [place].

## 2017-04-01 NOTE — Telephone Encounter (Signed)
LMOVM for pt to return call 

## 2017-04-01 NOTE — Telephone Encounter (Signed)
Please advise patient that we probably won't get labs from Dr. Holley Raring until tomorrow. She should go ahead and taken and extra 1/2 tablet of metoprolol again today since her BP has been high.

## 2017-04-01 NOTE — Telephone Encounter (Signed)
Patient was advised. Expressed understanding.  

## 2017-04-05 DIAGNOSIS — M6281 Muscle weakness (generalized): Secondary | ICD-10-CM | POA: Diagnosis not present

## 2017-04-05 DIAGNOSIS — M25511 Pain in right shoulder: Secondary | ICD-10-CM | POA: Diagnosis not present

## 2017-04-05 DIAGNOSIS — G8929 Other chronic pain: Secondary | ICD-10-CM | POA: Diagnosis not present

## 2017-04-05 DIAGNOSIS — Z9889 Other specified postprocedural states: Secondary | ICD-10-CM | POA: Diagnosis not present

## 2017-04-05 DIAGNOSIS — M25611 Stiffness of right shoulder, not elsewhere classified: Secondary | ICD-10-CM | POA: Diagnosis not present

## 2017-04-05 NOTE — Telephone Encounter (Signed)
Can you please contact Dr. Elwyn Lade office and see if they have lab results yet. Her BP is very high and we need to know if we can get her back on the losartan .

## 2017-04-05 NOTE — Telephone Encounter (Signed)
Dr. Elwyn Lade nurse is faxing results over now. Fax received and placed on Dr. Maralyn Sago desk.

## 2017-04-08 ENCOUNTER — Ambulatory Visit: Payer: Self-pay | Admitting: Family Medicine

## 2017-04-08 NOTE — Progress Notes (Deleted)
Patient: [name] Female    DOB: 1970/08/03   47 y.o.   MRN: [id number] Visit Date: 04/08/2017  Today's Provider: [name], MD   No chief complaint on file.  Subjective:    HPI   Hypertension, follow-up:  BP Readings from Last 3 Encounters:  03/31/17 (!) 178/100  03/22/17 (!) 178/67  03/18/17 100/60    She was last seen for hypertension 1 weeks ago.  BP at that visit was 178/100. Management since that visit includes; blood pressure higher since stopping losartan. Patient was advised to take an extra 1/2 tablet of metoprolol for next to days. Requested lab results from Dr. Holley Raring, patient was advised to continue taking 1/2 metoprolol and follow-up in 1 week.She reports {excellent/good/fair/poor:19665} compliance with treatment. She {ACTION; IS/IS [id number]} having side effects. *** She {is/is not:9024} exercising. She {is/is not:9024} adherent to low salt diet.   Outside blood pressures are ***. She is experiencing {Symptoms; cardiac:12860}.  Patient denies {Symptoms; cardiac:12860}.   Cardiovascular risk factors include {cv risk factors:510}.  Use of agents associated with hypertension: {bp agents assoc with hypertension:511::"none"}.   ------------------------------------------------------------------------    Allergies  Allergen Reactions  . Tramadol     Mouth feels like it's on fire     Current Outpatient Medications:  .  acetaminophen (TYLENOL) 325 MG tablet, Take 2 tablets (650 mg total) by mouth every 6 (six) hours as needed for mild pain (or Fever >/= 101)., Disp: , Rfl:  .  ALPRAZolam (XANAX) 0.5 MG tablet, Take 0.5-1 tablets (0.25-0.5 mg total) by mouth every 6 (six) hours as needed for anxiety., Disp: 60 tablet, Rfl: 5 .  aspirin EC 81 MG tablet, Take 1 tablet (81 mg total) by mouth daily., Disp: , Rfl:  .  Blood Glucose Monitoring Suppl (ONE TOUCH ULTRA 2) w/Device KIT, Use to check blood sugar once a day. Dx. E11.9, Disp: 1 each,  Rfl: 0 .  buPROPion (WELLBUTRIN XL) 150 MG 24 hr tablet, Take 1 tablet (150 mg total) by mouth daily., Disp: 30 tablet, Rfl: 1 .  citalopram (CELEXA) 40 MG tablet, Take 1 tablet (40 mg total) by mouth daily., Disp: 30 tablet, Rfl: 11 .  diltiazem (CARDIZEM CD) 120 MG 24 hr capsule, Take 1 capsule (120 mg total) by mouth daily., Disp: 90 capsule, Rfl: 3 .  ezetimibe (ZETIA) 10 MG tablet, Take 1 tablet (10 mg total) by mouth at bedtime., Disp: , Rfl:  .  gabapentin (NEURONTIN) 300 MG capsule, Take 1 capsule (300 mg total) by mouth 2 (two) times daily., Disp: 30 capsule, Rfl: 0 .  glipiZIDE (GLUCOTROL XL) 2.5 MG 24 hr tablet, Take 1 tablet (2.5 mg total) by mouth daily with breakfast., Disp: 30 tablet, Rfl: 1 .  hyoscyamine (LEVSIN SL) 0.125 MG SL tablet, Place 1 tablet (0.125 mg total) under the tongue every 6 (six) hours as needed. (Patient taking differently: Place 0.125 mg under the tongue every 6 (six) hours as needed for cramping. ), Disp: 60 tablet, Rfl: 0 .  levothyroxine (SYNTHROID, LEVOTHROID) 25 MCG tablet, Take 1 tablet (25 mcg total) daily before breakfast by mouth., Disp: 30 tablet, Rfl: 12 .  metoprolol succinate (TOPROL-XL) 25 MG 24 hr tablet, Take 1 tablet (25 mg total) by mouth daily., Disp: 30 tablet, Rfl: 0 .  nitroGLYCERIN (NITROSTAT) 0.4 MG SL tablet, Place 1 tablet (0.4 mg total) under the tongue every 5 (five) minutes as needed for chest pain., Disp: 25 tablet, Rfl: 6 .  oxyCODONE (OXY IR/ROXICODONE) 5 MG immediate release tablet, Take 0.5 tablets (2.5 mg total) by mouth every 6 (six) hours as needed for moderate pain or severe pain., Disp: 20 tablet, Rfl: 0 .  pantoprazole (PROTONIX) 40 MG tablet, Take 1 tablet (40 mg total) by mouth 2 (two) times daily., Disp: 60 tablet, Rfl: 5  Review of Systems  Constitutional: Negative for appetite change, chills, fatigue and fever.  Respiratory: Negative for chest tightness and shortness of breath.   Cardiovascular: Negative for chest pain  and palpitations.  Gastrointestinal: Negative for abdominal pain, nausea and vomiting.  Neurological: Negative for dizziness and weakness.    Social History   Tobacco Use  . Smoking status: Former Smoker    Types: Cigarettes    Last attempt to quit: 08/31/1994    Years since quitting: 22.6  . Smokeless tobacco: Never Used  . Tobacco comment: quit 28 years ago  Substance Use Topics  . Alcohol use: No    Alcohol/week: 0.0 oz   Objective:   There were no vitals taken for this visit. There were no vitals filed for this visit.   Physical Exam      Assessment & Plan:           [name], MD  [facility]

## 2017-04-09 ENCOUNTER — Other Ambulatory Visit: Payer: Self-pay

## 2017-04-09 ENCOUNTER — Emergency Department: Payer: Medicare Other

## 2017-04-09 ENCOUNTER — Inpatient Hospital Stay
Admission: EM | Admit: 2017-04-09 | Discharge: 2017-04-13 | DRG: 065 | Disposition: A | Discharge status: Home / Self Care | Payer: Medicare Other | Attending: Internal Medicine | Admitting: Internal Medicine

## 2017-04-09 ENCOUNTER — Observation Stay: Payer: Medicare Other

## 2017-04-09 ENCOUNTER — Encounter: Payer: Self-pay | Admitting: Emergency Medicine

## 2017-04-09 DIAGNOSIS — R29705 NIHSS score 5: Secondary | ICD-10-CM | POA: Diagnosis present

## 2017-04-09 DIAGNOSIS — Z7984 Long term (current) use of oral hypoglycemic drugs: Secondary | ICD-10-CM

## 2017-04-09 DIAGNOSIS — Z87891 Personal history of nicotine dependence: Secondary | ICD-10-CM | POA: Diagnosis not present

## 2017-04-09 DIAGNOSIS — M25511 Pain in right shoulder: Secondary | ICD-10-CM | POA: Diagnosis present

## 2017-04-09 DIAGNOSIS — R55 Syncope and collapse: Secondary | ICD-10-CM | POA: Diagnosis not present

## 2017-04-09 DIAGNOSIS — R200 Anesthesia of skin: Secondary | ICD-10-CM

## 2017-04-09 DIAGNOSIS — Z823 Family history of stroke: Secondary | ICD-10-CM

## 2017-04-09 DIAGNOSIS — F319 Bipolar disorder, unspecified: Secondary | ICD-10-CM | POA: Diagnosis present

## 2017-04-09 DIAGNOSIS — Z955 Presence of coronary angioplasty implant and graft: Secondary | ICD-10-CM

## 2017-04-09 DIAGNOSIS — I6523 Occlusion and stenosis of bilateral carotid arteries: Secondary | ICD-10-CM | POA: Diagnosis not present

## 2017-04-09 DIAGNOSIS — R202 Paresthesia of skin: Secondary | ICD-10-CM | POA: Diagnosis not present

## 2017-04-09 DIAGNOSIS — Z833 Family history of diabetes mellitus: Secondary | ICD-10-CM

## 2017-04-09 DIAGNOSIS — Z66 Do not resuscitate: Secondary | ICD-10-CM | POA: Diagnosis present

## 2017-04-09 DIAGNOSIS — E11649 Type 2 diabetes mellitus with hypoglycemia without coma: Secondary | ICD-10-CM | POA: Diagnosis present

## 2017-04-09 DIAGNOSIS — I951 Orthostatic hypotension: Secondary | ICD-10-CM | POA: Diagnosis present

## 2017-04-09 DIAGNOSIS — I2510 Atherosclerotic heart disease of native coronary artery without angina pectoris: Secondary | ICD-10-CM | POA: Diagnosis present

## 2017-04-09 DIAGNOSIS — Z9884 Bariatric surgery status: Secondary | ICD-10-CM

## 2017-04-09 DIAGNOSIS — R1310 Dysphagia, unspecified: Secondary | ICD-10-CM | POA: Diagnosis present

## 2017-04-09 DIAGNOSIS — Z7982 Long term (current) use of aspirin: Secondary | ICD-10-CM

## 2017-04-09 DIAGNOSIS — I639 Cerebral infarction, unspecified: Secondary | ICD-10-CM | POA: Diagnosis present

## 2017-04-09 DIAGNOSIS — Z90721 Acquired absence of ovaries, unilateral: Secondary | ICD-10-CM

## 2017-04-09 DIAGNOSIS — I6329 Cerebral infarction due to unspecified occlusion or stenosis of other precerebral arteries: Secondary | ICD-10-CM

## 2017-04-09 DIAGNOSIS — G459 Transient cerebral ischemic attack, unspecified: Secondary | ICD-10-CM | POA: Diagnosis not present

## 2017-04-09 DIAGNOSIS — N184 Chronic kidney disease, stage 4 (severe): Secondary | ICD-10-CM | POA: Diagnosis not present

## 2017-04-09 DIAGNOSIS — E119 Type 2 diabetes mellitus without complications: Secondary | ICD-10-CM | POA: Diagnosis not present

## 2017-04-09 DIAGNOSIS — E1122 Type 2 diabetes mellitus with diabetic chronic kidney disease: Secondary | ICD-10-CM | POA: Diagnosis present

## 2017-04-09 DIAGNOSIS — Z85820 Personal history of malignant melanoma of skin: Secondary | ICD-10-CM | POA: Diagnosis not present

## 2017-04-09 DIAGNOSIS — R296 Repeated falls: Secondary | ICD-10-CM

## 2017-04-09 DIAGNOSIS — I129 Hypertensive chronic kidney disease with stage 1 through stage 4 chronic kidney disease, or unspecified chronic kidney disease: Secondary | ICD-10-CM | POA: Diagnosis present

## 2017-04-09 DIAGNOSIS — I252 Old myocardial infarction: Secondary | ICD-10-CM

## 2017-04-09 DIAGNOSIS — R29818 Other symptoms and signs involving the nervous system: Secondary | ICD-10-CM | POA: Diagnosis not present

## 2017-04-09 DIAGNOSIS — Z8249 Family history of ischemic heart disease and other diseases of the circulatory system: Secondary | ICD-10-CM

## 2017-04-09 DIAGNOSIS — Z79890 Hormone replacement therapy (postmenopausal): Secondary | ICD-10-CM

## 2017-04-09 DIAGNOSIS — R4781 Slurred speech: Secondary | ICD-10-CM | POA: Diagnosis not present

## 2017-04-09 DIAGNOSIS — D509 Iron deficiency anemia, unspecified: Secondary | ICD-10-CM | POA: Diagnosis present

## 2017-04-09 DIAGNOSIS — E039 Hypothyroidism, unspecified: Secondary | ICD-10-CM | POA: Diagnosis present

## 2017-04-09 LAB — COMPREHENSIVE METABOLIC PANEL WITH GFR
ALT: 35 U/L (ref 14–54)
AST: 27 U/L (ref 15–41)
Albumin: 3.6 g/dL (ref 3.5–5.0)
Alkaline Phosphatase: 156 U/L — ABNORMAL HIGH (ref 38–126)
Anion gap: 9 (ref 5–15)
BUN: 20 mg/dL (ref 6–20)
CO2: 25 mmol/L (ref 22–32)
Calcium: 8.6 mg/dL — ABNORMAL LOW (ref 8.9–10.3)
Chloride: 105 mmol/L (ref 101–111)
Creatinine, Ser: 2.06 mg/dL — ABNORMAL HIGH (ref 0.44–1.00)
GFR calc Af Amer: 32 mL/min — ABNORMAL LOW
GFR calc non Af Amer: 27 mL/min — ABNORMAL LOW
Glucose, Bld: 139 mg/dL — ABNORMAL HIGH (ref 65–99)
Potassium: 4.1 mmol/L (ref 3.5–5.1)
Sodium: 139 mmol/L (ref 135–145)
Total Bilirubin: 0.5 mg/dL (ref 0.3–1.2)
Total Protein: 6.5 g/dL (ref 6.5–8.1)

## 2017-04-09 LAB — CBC
HCT: 33.8 % — ABNORMAL LOW (ref 35.0–47.0)
Hemoglobin: 11 g/dL — ABNORMAL LOW (ref 12.0–16.0)
MCH: 27.5 pg (ref 26.0–34.0)
MCHC: 32.7 g/dL (ref 32.0–36.0)
MCV: 84.3 fL (ref 80.0–100.0)
Platelets: 202 K/uL (ref 150–440)
RBC: 4.01 MIL/uL (ref 3.80–5.20)
RDW: 14.5 % (ref 11.5–14.5)
WBC: 9.4 K/uL (ref 3.6–11.0)

## 2017-04-09 LAB — APTT: aPTT: 28 seconds (ref 24–36)

## 2017-04-09 LAB — GLUCOSE, CAPILLARY
GLUCOSE-CAPILLARY: 378 mg/dL — AB (ref 65–99)
GLUCOSE-CAPILLARY: 177 mg/dL — AB (ref 65–99)
Glucose-Capillary: 169 mg/dL — ABNORMAL HIGH (ref 65–99)
Glucose-Capillary: 53 mg/dL — ABNORMAL LOW (ref 65–99)

## 2017-04-09 LAB — DIFFERENTIAL
Basophils Absolute: 0.2 K/uL — ABNORMAL HIGH (ref 0–0.1)
Basophils Relative: 2 %
Eosinophils Absolute: 0.6 K/uL (ref 0–0.7)
Eosinophils Relative: 6 %
Lymphocytes Relative: 17 %
Lymphs Abs: 1.6 K/uL (ref 1.0–3.6)
Monocytes Absolute: 0.6 K/uL (ref 0.2–0.9)
Monocytes Relative: 6 %
Neutro Abs: 6.5 K/uL (ref 1.4–6.5)
Neutrophils Relative %: 69 %

## 2017-04-09 LAB — PROTIME-INR
INR: 1.03
Prothrombin Time: 13.4 s (ref 11.4–15.2)

## 2017-04-09 LAB — TROPONIN I

## 2017-04-09 MED ORDER — ACETAMINOPHEN 160 MG/5ML PO SOLN
650.0000 mg | ORAL | Status: DC | PRN
  Filled 2017-04-09: qty 20.3

## 2017-04-09 MED ORDER — STROKE: EARLY STAGES OF RECOVERY BOOK
Freq: Once | Status: AC
  Administered 2017-04-09: 19:00:00

## 2017-04-09 MED ORDER — GABAPENTIN 300 MG PO CAPS
300.0000 mg | ORAL_CAPSULE | Freq: Two times a day (BID) | ORAL | Status: DC
  Administered 2017-04-09 – 2017-04-13 (×8): 300 mg via ORAL
  Filled 2017-04-09 (×9): qty 1

## 2017-04-09 MED ORDER — INSULIN ASPART 100 UNIT/ML ~~LOC~~ SOLN: 0.0000 [IU] | Freq: Every day | SUBCUTANEOUS | Status: DC

## 2017-04-09 MED ORDER — SODIUM CHLORIDE 0.9 % IV SOLN
INTRAVENOUS | Status: AC
  Administered 2017-04-09: 15:00:00 via INTRAVENOUS

## 2017-04-09 MED ORDER — OXYCODONE HCL 5 MG PO TABS
2.5000 mg | ORAL_TABLET | Freq: Four times a day (QID) | ORAL | Status: DC | PRN
  Administered 2017-04-09: 23:00:00 2.5 mg via ORAL
  Filled 2017-04-09: qty 1

## 2017-04-09 MED ORDER — CITALOPRAM HYDROBROMIDE 20 MG PO TABS
40.0000 mg | ORAL_TABLET | Freq: Every day | ORAL | Status: DC
  Administered 2017-04-10 – 2017-04-13 (×4): 40 mg via ORAL
  Filled 2017-04-09 (×4): qty 2

## 2017-04-09 MED ORDER — ASPIRIN EC 81 MG PO TBEC: 81.0000 mg | DELAYED_RELEASE_TABLET | Freq: Every day | ORAL | Status: DC

## 2017-04-09 MED ORDER — ACETAMINOPHEN 650 MG RE SUPP: 325.0000 mg | RECTAL | Status: DC | PRN

## 2017-04-09 MED ORDER — ASPIRIN 300 MG RE SUPP: 300.0000 mg | Freq: Every day | RECTAL | Status: DC

## 2017-04-09 MED ORDER — INSULIN ASPART 100 UNIT/ML ~~LOC~~ SOLN: 0.0000 [IU] | Freq: Three times a day (TID) | SUBCUTANEOUS | Status: DC

## 2017-04-09 MED ORDER — BUPROPION HCL ER (XL) 150 MG PO TB24
150.0000 mg | ORAL_TABLET | Freq: Every day | ORAL | Status: DC
  Administered 2017-04-10 – 2017-04-13 (×4): 150 mg via ORAL
  Filled 2017-04-09 (×4): qty 1

## 2017-04-09 MED ORDER — ASPIRIN EC 325 MG PO TBEC
325.0000 mg | DELAYED_RELEASE_TABLET | Freq: Every day | ORAL | Status: DC
  Administered 2017-04-09 – 2017-04-13 (×5): 325 mg via ORAL
  Filled 2017-04-09 (×5): qty 1

## 2017-04-09 MED ORDER — ACETAMINOPHEN 325 MG PO TABS
650.0000 mg | ORAL_TABLET | ORAL | Status: DC | PRN
  Administered 2017-04-11: 12:00:00 650 mg via ORAL
  Filled 2017-04-09: qty 2

## 2017-04-09 MED ORDER — ACETAMINOPHEN 650 MG RE SUPP: 650.0000 mg | RECTAL | Status: DC | PRN

## 2017-04-09 MED ORDER — ALPRAZOLAM 0.25 MG PO TABS
0.2500 mg | ORAL_TABLET | Freq: Four times a day (QID) | ORAL | Status: DC | PRN
  Administered 2017-04-10: 18:00:00 0.25 mg via ORAL
  Filled 2017-04-09: qty 1

## 2017-04-09 MED ORDER — ACETAMINOPHEN 325 MG PO TABS: 650.0000 mg | ORAL_TABLET | Freq: Four times a day (QID) | ORAL | Status: DC | PRN

## 2017-04-09 MED ORDER — MORPHINE SULFATE (PF) 2 MG/ML IV SOLN: 1.0000 mg | INTRAVENOUS | Status: DC | PRN

## 2017-04-09 MED ORDER — OXYCODONE HCL 5 MG PO TABS
5.0000 mg | ORAL_TABLET | Freq: Four times a day (QID) | ORAL | Status: DC | PRN
  Administered 2017-04-09 – 2017-04-13 (×9): 5 mg via ORAL
  Filled 2017-04-09 (×9): qty 1

## 2017-04-09 MED ORDER — ENOXAPARIN SODIUM 40 MG/0.4ML ~~LOC~~ SOLN
40.0000 mg | SUBCUTANEOUS | Status: DC
  Administered 2017-04-09 – 2017-04-12 (×4): 40 mg via SUBCUTANEOUS
  Filled 2017-04-09 (×4): qty 0.4

## 2017-04-09 NOTE — ED Notes (Signed)
Pt arrives to ED with son. Pt had shoulder surgery on R shoulder 2/12. Son states he was in his first class today around 8:40 and pt called him. Son noticed slurred speech. Brought pt to ED. Pt is unable to stay awake in triage. Son states hx of CBG fluctuating. States pt ate a little this morning.   Pt able to open eyes but closes them right after. No facial droop noted by this RN. Pt with equal grip strengths. Unable to lift legs by self at this time. States symptoms began this morning when called son. States woke up at 6am and was normal. Also c/o heaviness to legs, weakness all over, and dizziness. Pt mumbling responses to questions.   Pt fell this AM prompting her to call son.

## 2017-04-09 NOTE — Evaluation (Signed)
Clinical/Bedside Swallow Evaluation Patient Details  Name: [name] MRN: [id number] Date of Birth: [birth year]  Today's Date: 04/09/2017 Time: SLP Start Time (ACUTE ONLY): 1600 SLP Stop Time (ACUTE ONLY): 1700 SLP Time Calculation (min) (ACUTE ONLY): 60 min  Past Medical History:  Past Medical History:  Diagnosis Date  . Anemia    iron deficiency anemia  . Aortic arch aneurysm ([place])   . Bipolar disorder ([place])   . BRCA negative 2014  . Colon polyp   . Family history of breast cancer    BRCA neg 2014  . Gastric ulcer 04/27/2011  . Headache    Migraines  . Malignant melanoma of skin of scalp ([place])   . MI, acute, non ST segment elevation ([place])   . Morbid obesity ([place])    a. s/p gastric bypass.  . Myocardial infarction ([place]) 06/03/2015  . Neuromuscular disorder ([place])   . S/P drug eluting coronary stent placement 06/04/2015   Past Surgical History:  Past Surgical History:  Procedure Laterality Date  . APPENDECTOMY    . CARDIAC CATHETERIZATION N/A 11/09/2014   Procedure: Coronary Angiography;  Surgeon: Minna Merritts, MD;  Location: [place] [facility];  Service: Cardiovascular;  Laterality: N/A;  . CARDIAC CATHETERIZATION N/A 11/12/2014   Procedure: Coronary Stent Intervention;  Surgeon: Isaias Cowman, MD;  Location: [place] [facility];  Service: Cardiovascular;  Laterality: N/A;  . CARDIAC CATHETERIZATION N/A 04/18/2015   Procedure: Left Heart Cath and Coronary Angiography;  Surgeon: Minna Merritts, MD;  Location: [place] [facility];  Service: Cardiovascular;  Laterality: N/A;  . CARDIAC CATHETERIZATION Left 06/04/2015   Procedure: Left Heart Cath and Coronary Angiography;  Surgeon: Wellington Hampshire, MD;  Location: [facility];  Service: Cardiovascular;  Laterality: Left;  . CARDIAC CATHETERIZATION N/A 06/04/2015   Procedure: Coronary Stent Intervention;  Surgeon: Wellington Hampshire, MD;  Location: [place] [facility];  Service: Cardiovascular;  Laterality:  N/A;  . CESAREAN SECTION  2001  . CHOLECYSTECTOMY N/A 11/18/2016   Procedure: LAPAROSCOPIC CHOLECYSTECTOMY WITH INTRAOPERATIVE CHOLANGIOGRAM;  Surgeon: Christene Lye, MD;  Location: [facility];  Service: General;  Laterality: N/A;  . COLONOSCOPY WITH PROPOFOL N/A 04/27/2016   Procedure: COLONOSCOPY WITH PROPOFOL;  Surgeon: [name], MD;  Location: [facility];  Service: Endoscopy;  Laterality: N/A;  . CORONARY ANGIOPLASTY    . DILATION AND CURETTAGE OF UTERUS    . ESOPHAGOGASTRODUODENOSCOPY (EGD) WITH PROPOFOL N/A 09/14/2014   Procedure: ESOPHAGOGASTRODUODENOSCOPY (EGD) WITH PROPOFOL;  Surgeon: Josefine Class, MD;  Location: [facility];  Service: Endoscopy;  Laterality: N/A;  . ESOPHAGOGASTRODUODENOSCOPY (EGD) WITH PROPOFOL N/A 04/27/2016   Procedure: ESOPHAGOGASTRODUODENOSCOPY (EGD) WITH PROPOFOL;  Surgeon: [name], MD;  Location: [place];  Service: Endoscopy;  Laterality: N/A;  Diabetic - oral meds  . GASTRIC BYPASS  09/2009   [facility]   . Left Carotid to sublcavian artery bypass w/ subclavian artery ligation     a. Performed @ [facility].  . MELANOMA EXCISION  2016   Dr. Evorn Gong  . [place]  2002  . RIGHT OOPHORECTOMY    . SHOULDER ARTHROSCOPY WITH OPEN ROTATOR CUFF REPAIR Right 01/07/2016   Procedure: SHOULDER ARTHROSCOPY WITH DEBRIDMENT, SUBACHROMIAL DECOMPRESSION;  Surgeon: Corky Mull, MD;  Location: [facility];  Service: Orthopedics;  Laterality: Right;  . SHOULDER ARTHROSCOPY WITH OPEN ROTATOR CUFF REPAIR Right 03/16/2017   Procedure: SHOULDER ARTHROSCOPY WITH OPEN ROTATOR CUFF REPAIR POSSIBLE BICEPS TENODESIS;  Surgeon: [name],  Marshall Cork, MD;  Location: [facility];  Service: Orthopedics;  Laterality: Right;  . TRIGGER FINGER RELEASE Right     Middle Finger   HPI:   Pt is an 47 y.o. female with multiple medical problems including "multiple strokes" per pt report.  She stated awakening today and being more dizzy than usual.   She fell hitting her arm but was able to text her son.  She has a baseline rotator cuff injury.  When son arrived, the patient was laying over the bed and lethargic with slurred speech.  Also reported paresthesias in the left leg and right arm.  Currently, pt is alert, awake and oriented x3. She is conversing w/ family in room, [place] staff and requesting Pain medication. Speech appeared grossly [facility] now w/ articulation and intelligibility 100%. Pt is somewhat distracted but followed through w/ all tasks including picture task w/ accuracy w/ NSG staff in room.     Assessment / Plan / Recommendation Clinical Impression  Pt appears to present w/ adequate oropharyngeal phase swallow function w/ po trials; she appears at reduced risk for aspiration when following general aspiration precautions including eating slowly and paying attention to task; less talking while eating. Pt consumed po trials w/ no immediate, overt s/s of aspiration noted; no wet vocal quality or decline in respiratory status during/post trials. Oral phase appeared timely for bolus transfer and adequate for oral clearing; pt took her time chewing foods(few missing dentition) - moistened well and cut for easier masticating. Pt required instruction on precautions including NOT talking w/ food in the mouth, paying attention to chewing and swallowing of foods, eating/drinking slowly and using small bites/sips. Recommend a regular/mech soft diet(meats cut d/t RUE rotator cuff injury) w/ Thin liquids via cup/straw; general aspiration precautions; REFLUX precautions and PPI. Pills WHOLE in Puree if easier for swallowing d/t her complaints of diffculty at home "w/ Larger pills". NSG to reconsult if any decline in status while admitted.  SLP Visit Diagnosis: Dysphagia, unspecified (R13.10)    Aspiration Risk  (reduced)    Diet Recommendation  Mech soft diet (cut meats d/t unable to cut sec. to rotator cuff injury); Thin liquids. General aspiration  precautions; REFLUX precautions.   Medication Administration: Whole meds with puree(especially for Larger Pills which she has trouble swallowing)    Other  Recommendations Recommended Consults: (Dietician consult) Oral Care Recommendations: Oral care BID;Patient independent with oral care;Staff/trained caregiver to provide oral care Other Recommendations: (n/a)   Follow up Recommendations None      Frequency and Duration (n/a)  (n/a)       Prognosis Prognosis for Safe Diet Advancement: Good Barriers to Reach Goals: (none indicated)      Swallow Study   General Date of Onset: 04/09/17 Type of Study: Bedside Swallow Evaluation Previous Swallow Assessment: none reported Diet Prior to this Study: NPO(regular diet at home) Temperature Spikes Noted: No(wbc 9.4) Respiratory Status: Room air History of Recent Intubation: No Behavior/Cognition: Alert;Cooperative;Pleasant mood;Distractible;Requires cueing Oral Cavity Assessment: Within Functional Limits;Dry(min) Oral Care Completed by SLP: Recent completion by staff Oral Cavity - Dentition: Adequate natural dentition(fair) Vision: Functional for self-feeding Self-Feeding Abilities: Able to feed self;Needs assist;Needs set up(RUE in immobilizer d/t rotator cuff injury) Patient Positioning: Upright in bed Baseline Vocal Quality: Normal Volitional Cough: Strong Volitional Swallow: Able to elicit    Oral/Motor/Sensory Function Overall Oral Motor/Sensory Function: Within functional limits   Ice Chips Ice chips: Within functional limits Presentation: Spoon(fed; 2 trials)   Thin Liquid Thin Liquid: Within  functional limits Presentation: Cup;Self Fed(~3 ozs )    Nectar Thick Nectar Thick Liquid: Not tested   Honey Thick Honey Thick Liquid: Not tested   Puree Puree: Within functional limits Presentation: Spoon(fed; 3 trials)   Solid   GO   Solid: Within functional limits Presentation: Spoon(fed; 3 trials) Other Comments: assisted  to RUE injury       [name], MS, CCC-SLP Watson,Katherine 04/09/2017,5:26 PM

## 2017-04-09 NOTE — ED Notes (Signed)
Called report to Nolen Mu RN, [employer]

## 2017-04-09 NOTE — ED Provider Notes (Signed)
[facility] Emergency Department Provider Note ____________________________________________   I have reviewed the triage vital signs and the triage nursing note.  HISTORY  Chief Complaint Code Stroke   Historian Patient As well as son  HPI [name] [name] is a 47 y.o. female presents to the emergency department for evaluation of syncope versus fall, questionable stroke, reportedly last seen normal sometime yesterday, son spoke to her on the phone around 6 or 7 AM and she stated that she was feeling dizzy but did not have any slurred speech.  He got a call from her that she had a fall and so he left school to go check on her.  She had gotten up herself and was not suffering any injuries, but he felt like she might have some slurred speech.  Patient states that she feels just weak all over, now actually for several days.  She does have a history of acute renal failure.  Patient herself states that she just felt lightheaded and then sounds like may have passed out.  She is currently complaining of paresthesia to the right arm and the left leg.   Past Medical History:  Diagnosis Date  . Anemia    iron deficiency anemia  . Aortic arch aneurysm ([place])   . Bipolar disorder ([place])   . BRCA negative 2014  . Colon polyp   . Family history of breast cancer    BRCA neg 2014  . Gastric ulcer 04/27/2011  . Headache    Migraines  . Malignant melanoma of skin of scalp ([place])   . MI, acute, non ST segment elevation ([place])   . Morbid obesity ([place])    a. s/p gastric bypass.  . Myocardial infarction ([place]) 06/03/2015  . Neuromuscular disorder ([place])   . S/P drug eluting coronary stent placement 06/04/2015    Patient Active Problem List   Diagnosis Date Noted  . Syncope 04/09/2017  . Insomnia 03/18/2017  . Sepsis ([place]) 03/18/2017  . Ischemic cardiomyopathy   . Arthritis   . Anxiety   . Acute colitis 01/27/2017  . Hx of colonic polyps   . Bariatric surgery status    . Sinus tachycardia 02/27/2016  . H/O medication noncompliance 12/14/2015  . Acute pyelonephritis   . Emesis   . CKD (chronic kidney disease), stage IV ([place])   . CAD (coronary artery disease)   . Hypertensive heart disease   . CKD (chronic kidney disease) stage 3, GFR 30-59 ml/min (HCC) 06/05/2015  . Presence of drug coated stent in LAD coronary artery & now in D1 06/04/2015  . Colitis 06/03/2015  . Carotid stenosis 04/30/2015  . Type 2 diabetes mellitus with complication, without long-term current use of insulin ([place])   . Atypical chest pain   . Iron deficiency anemia 03/22/2015  . Vitamin B12 deficiency 02/18/2015  . Misuse of medications for pain 02/18/2015  . Severe recurrent major depression without psychotic features ([place]) 02/15/2015  . Helicobacter pylori infection 11/23/2014  . Hemiparesis, left ([place]) 11/23/2014  . Benign neoplasm of colon 11/20/2014  . Malignant melanoma ([employer]) 08/25/2014  . Chronic systolic CHF (congestive heart failure) ([place])   . Incomplete bladder emptying 07/12/2014  . Adult hypothyroidism 12/30/2013  . Aberrant subclavian artery 11/17/2013  . Multiple sclerosis ([place]) 11/02/2013  . History of CVA (cerebrovascular accident) 06/20/2013  . Headache, migraine 05/29/2013  . Hyperlipidemia   . GERD (gastroesophageal reflux disease)   . Neuropathy ([place]) 01/02/2011  . CVA (cerebral vascular accident) ([place]) 06/21/2008  .  Essential hypertension 05/01/2008    Past Surgical History:  Procedure Laterality Date  . APPENDECTOMY    . CARDIAC CATHETERIZATION N/A 11/09/2014   Procedure: Coronary Angiography;  Surgeon: Minna Merritts, MD;  Location: [place] [facility];  Service: Cardiovascular;  Laterality: N/A;  . CARDIAC CATHETERIZATION N/A 11/12/2014   Procedure: Coronary Stent Intervention;  Surgeon: Isaias Cowman, MD;  Location: [place] [facility];  Service: Cardiovascular;  Laterality: N/A;  . CARDIAC CATHETERIZATION N/A 04/18/2015   Procedure:  Left Heart Cath and Coronary Angiography;  Surgeon: Minna Merritts, MD;  Location: [place] [facility];  Service: Cardiovascular;  Laterality: N/A;  . CARDIAC CATHETERIZATION Left 06/04/2015   Procedure: Left Heart Cath and Coronary Angiography;  Surgeon: Wellington Hampshire, MD;  Location: [place] [facility];  Service: Cardiovascular;  Laterality: Left;  . CARDIAC CATHETERIZATION N/A 06/04/2015   Procedure: Coronary Stent Intervention;  Surgeon: Wellington Hampshire, MD;  Location: [place] [facility];  Service: Cardiovascular;  Laterality: N/A;  . CESAREAN SECTION  2001  . CHOLECYSTECTOMY N/A 11/18/2016   Procedure: LAPAROSCOPIC CHOLECYSTECTOMY WITH INTRAOPERATIVE CHOLANGIOGRAM;  Surgeon: Christene Lye, MD;  Location: [facility];  Service: General;  Laterality: N/A;  . COLONOSCOPY WITH PROPOFOL N/A 04/27/2016   Procedure: COLONOSCOPY WITH PROPOFOL;  Surgeon: [name], MD;  Location: [place];  Service: Endoscopy;  Laterality: N/A;  . CORONARY ANGIOPLASTY    . DILATION AND CURETTAGE OF UTERUS    . ESOPHAGOGASTRODUODENOSCOPY (EGD) WITH PROPOFOL N/A 09/14/2014   Procedure: ESOPHAGOGASTRODUODENOSCOPY (EGD) WITH PROPOFOL;  Surgeon: Josefine Class, MD;  Location: [facility] ENDOSCOPY;  Service: Endoscopy;  Laterality: N/A;  . ESOPHAGOGASTRODUODENOSCOPY (EGD) WITH PROPOFOL N/A 04/27/2016   Procedure: ESOPHAGOGASTRODUODENOSCOPY (EGD) WITH PROPOFOL;  Surgeon: [name], MD;  Location: [place];  Service: Endoscopy;  Laterality: N/A;  Diabetic - oral meds  . GASTRIC BYPASS  09/2009   [facility]   . Left Carotid to sublcavian artery bypass w/ subclavian artery ligation     a. Performed @ [place].  . MELANOMA EXCISION  2016   Dr. Evorn Gong  . [place]  2002  . RIGHT OOPHORECTOMY    . SHOULDER ARTHROSCOPY WITH OPEN ROTATOR CUFF REPAIR Right 01/07/2016   Procedure: SHOULDER ARTHROSCOPY WITH DEBRIDMENT, SUBACHROMIAL DECOMPRESSION;  Surgeon: Corky Mull, MD;   Location: [facility];  Service: Orthopedics;  Laterality: Right;  . SHOULDER ARTHROSCOPY WITH OPEN ROTATOR CUFF REPAIR Right 03/16/2017   Procedure: SHOULDER ARTHROSCOPY WITH OPEN ROTATOR CUFF REPAIR POSSIBLE BICEPS TENODESIS;  Surgeon: Corky Mull, MD;  Location: [facility];  Service: Orthopedics;  Laterality: Right;  . TRIGGER FINGER RELEASE Right     [place]    Prior to Admission medications   Medication Sig Start Date End Date Taking? Authorizing Provider  acetaminophen (TYLENOL) 325 MG tablet Take 2 tablets (650 mg total) by mouth every 6 (six) hours as needed for mild pain (or Fever >/= 101). 03/22/17   Loletha Grayer, MD  ALPRAZolam Duanne Moron) 0.5 MG tablet Take 0.5-1 tablets (0.25-0.5 mg total) by mouth every 6 (six) hours as needed for anxiety. 03/22/17   Loletha Grayer, MD  aspirin EC 81 MG tablet Take 1 tablet (81 mg total) by mouth daily. 02/19/15   Hildred Priest, MD  Blood Glucose Monitoring Suppl (ONE TOUCH ULTRA 2) w/Device KIT Use to check blood sugar once a day. Dx. E11.9 03/18/17   [name], MD  buPROPion (WELLBUTRIN XL) 150 MG 24 hr tablet  Take 1 tablet (150 mg total) by mouth daily. 03/18/17   [name], MD  citalopram (CELEXA) 40 MG tablet Take 1 tablet (40 mg total) by mouth daily. 01/13/17   [name], MD  diltiazem (CARDIZEM CD) 120 MG 24 hr capsule Take 1 capsule (120 mg total) by mouth daily. 01/31/16   Minna Merritts, MD  ezetimibe (ZETIA) 10 MG tablet Take 1 tablet (10 mg total) by mouth at bedtime. 03/22/17   Loletha Grayer, MD  gabapentin (NEURONTIN) 300 MG capsule Take 1 capsule (300 mg total) by mouth 2 (two) times daily. 03/22/17 03/22/18  Loletha Grayer, MD  glipiZIDE (GLUCOTROL XL) 2.5 MG 24 hr tablet Take 1 tablet (2.5 mg total) by mouth daily with breakfast. 03/31/17   [name], MD  hyoscyamine (LEVSIN SL) 0.125 MG SL tablet Place 1 tablet (0.125 mg total) under the tongue every 6 (six) hours as needed. Patient  taking differently: Place 0.125 mg under the tongue every 6 (six) hours as needed for cramping.  02/01/17   [name], MD  levothyroxine (SYNTHROID, LEVOTHROID) 25 MCG tablet Take 1 tablet (25 mcg total) daily before breakfast by mouth. 12/18/16   [name], MD  metoprolol succinate (TOPROL-XL) 25 MG 24 hr tablet Take 1 tablet (25 mg total) by mouth daily. 03/16/17   Poggi, Marshall Cork, MD  nitroGLYCERIN (NITROSTAT) 0.4 MG SL tablet Place 1 tablet (0.4 mg total) under the tongue every 5 (five) minutes as needed for chest pain. 04/30/15   Minna Merritts, MD  oxyCODONE (OXY IR/ROXICODONE) 5 MG immediate release tablet Take 0.5 tablets (2.5 mg total) by mouth every 6 (six) hours as needed for moderate pain or severe pain. 03/22/17   Loletha Grayer, MD  pantoprazole (PROTONIX) 40 MG tablet Take 1 tablet (40 mg total) by mouth 2 (two) times daily. 05/11/16   [name], MD    Allergies  Allergen Reactions  . Tramadol     Mouth feels like it's on fire    Family History  Problem Relation Age of Onset  . Hypertension Mother   . Anxiety disorder Mother   . Depression Mother   . Bipolar disorder Mother   . Heart disease Mother   . Hyperlipidemia Mother   . Kidney disease Father   . Heart disease Father   . Hypertension Father   . Diabetes Father   . Stroke Father   . Colon cancer Father        dx in his 73's  . Anxiety disorder Father   . Depression Father   . Skin cancer Father   . Kidney disease Sister   . Thyroid nodules Sister   . Hypertension Sister   . Hypertension Sister   . Diabetes Sister   . Hyperlipidemia Sister   . Depression Sister   . Breast cancer Maternal Aunt 54  . Breast cancer Maternal Aunt 9  . Ovarian cancer Cousin   . Colon cancer Cousin   . Breast cancer Other   . Kidney cancer Neg Hx   . Bladder Cancer Neg Hx     Social History Social History   Tobacco Use  . Smoking status: Former Smoker    Types: Cigarettes    Last attempt to quit:  08/31/1994    Years since quitting: 22.6  . Smokeless tobacco: Never Used  . Tobacco comment: quit 28 years ago  Substance Use Topics  . Alcohol use: No    Alcohol/week: 0.0  oz  . Drug use: No    Review of Systems  Constitutional: Negative for illness such as cough congestion vomiting or diarrhea. Eyes: Negative for visual changes. ENT: Negative for sore throat. Cardiovascular: Negative for chest pain. Respiratory: Negative for shortness of breath. Gastrointestinal: Negative for abdominal pain, vomiting and diarrhea. Genitourinary: Negative for dysuria. Musculoskeletal: Negative for back pain. Skin: Negative for rash. Neurological: Negative for headache.  ____________________________________________   PHYSICAL EXAM:  VITAL SIGNS: ED Triage Vitals  Enc Vitals Group     BP      Pulse      Resp      Temp      Temp src      SpO2      Weight      Height      Head Circumference      Peak Flow      Pain Score      Pain Loc      Pain Edu?      Excl. in [place]?      Constitutional: Alert and oriented. Well appearing and in no distress. HEENT   Head: Normocephalic and atraumatic.      Eyes: Conjunctivae are normal. Pupils equal and round.       Ears:         Nose: No congestion/rhinnorhea.   Mouth/Throat: Mucous membranes are moderately dry.   Neck: No stridor. Cardiovascular/Chest: Normal rate, regular rhythm.  No murmurs, rubs, or gallops. Respiratory: Normal respiratory effort without tachypnea nor retractions. Breath sounds are clear and equal bilaterally. No wheezes/rales/rhonchi. Gastrointestinal: Soft. No distention, no guarding, no rebound. Nontender.    Genitourinary/rectal:Deferred Musculoskeletal: Nontender with normal range of motion in all extremities. No joint effusions.  No lower extremity tenderness.  No edema. Neurologic: No facial droop, questionable slurred speech.  No tongue deviation.  No facial sensory abnormalities.  Patient reporting  paresthesia to the right upper extremity and left lower extremity.  Essentially decreased strength throughout 4 out of 5 in 4 extremities.  No ataxia. Skin:  Skin is warm, dry and intact. No rash noted. Psychiatric: Mood and affect are normal. Speech and behavior are normal. Patient exhibits appropriate insight and judgment.   ____________________________________________  LABS (pertinent positives/negatives) I, Lisa Roca, MD the attending physician have reviewed the labs noted below.  Labs Reviewed  GLUCOSE, CAPILLARY - Abnormal; Notable for the following components:      Result Value   Glucose-Capillary 169 (*)    All other components within normal limits  CBC - Abnormal; Notable for the following components:   Hemoglobin 11.0 (*)    HCT 33.8 (*)    All other components within normal limits  DIFFERENTIAL - Abnormal; Notable for the following components:   Basophils Absolute 0.2 (*)    All other components within normal limits  COMPREHENSIVE METABOLIC PANEL - Abnormal; Notable for the following components:   Glucose, Bld 139 (*)    Creatinine, Ser 2.06 (*)    Calcium 8.6 (*)    Alkaline Phosphatase 156 (*)    GFR calc non Af Amer 27 (*)    GFR calc Af Amer 32 (*)    All other components within normal limits  PROTIME-INR  APTT  TROPONIN I  URINE DRUG SCREEN, QUALITATIVE ([facility] ONLY)  CBG MONITORING, ED  POC URINE PREG, ED    ____________________________________________    EKG I, Lisa Roca, MD, the attending physician have personally viewed and interpreted all ECGs.  67 bpm.  Normal  sinus rhythm.  Narrow QRS.  Normal axis.  Normal ST and T wave ____________________________________________  RADIOLOGY   CT head without contrast for code stroke, discussed with the radiologist, radiologist report below:   CLINICAL DATA: Code stroke. Subacute neuro deficit. Slurred speech  EXAM: CT HEAD WITHOUT CONTRAST  TECHNIQUE: Contiguous axial images were obtained from  the base of the skull through the vertex without intravenous contrast.  COMPARISON: CT head 03/18/2017  FINDINGS: Brain: Negative for acute infarct. Negative for hemorrhage or mass.  Extensive chronic white matter changes are stable from the prior study. Chronic infarct right internal capsule stable. Chronic infarct left thalamus stable.  Vascular: Atherosclerotic calcification. Negative for acute vascular thrombosis  Skull: Negative  Sinuses/Orbits: Negative  Other: None  ASPECTS ([place] Stroke Program Early CT Score)  - Ganglionic level infarction (caudate, lentiform nuclei, internal capsule, insula, M1-M3 cortex): 7  - Supraganglionic infarction (M4-M6 cortex): 3  Total score (0-10 with 10 being normal): 10  IMPRESSION: 1. No acute abnormality 2. Extensive chronic microvascular ischemia, stable from the recent CT 3. ASPECTS is 10 4. These results were called by telephone at the time of interpretation on 04/09/2017 at 1:04 pm to Dr. Lisa Roca , who verbally acknowledged these results.      __________________________________________  PROCEDURES  Procedure(s) performed: None  Critical Care performed: CRITICAL CARE Performed by: Lisa Roca   Total critical care time: 30 minutes  Critical care time was exclusive of separately billable procedures and treating other patients.  Critical care was necessary to treat or prevent imminent or life-threatening deterioration.  Critical care was time spent personally by me on the following activities: development of treatment plan with patient and/or surrogate as well as nursing, discussions with consultants, evaluation of patient's response to treatment, examination of patient, obtaining history from patient or surrogate, ordering and performing treatments and interventions, ordering and review of laboratory studies, ordering and review of radiographic studies, pulse oximetry and re-evaluation of patient's  condition.    ____________________________________________  ED COURSE / ASSESSMENT AND PLAN  Pertinent labs & imaging results that were available during my care of the patient were reviewed by me and considered in my medical decision making (see chart for details).   Patient was made a code stroke by triage.  Dr. Doy Mince at the bedside when patient arrived back from head CT.  Patient looks generally weak and fatigued, question whether or not the unwitnessed fall could have been related to actually syncope.  She has history of chronic renal failure.  On stroke eval, she is complaining of tingling of her right arm and her left leg.  I spoke with the radiologist and her head CT was negative for an acute finding.  Given her underlying creatinine/kidney issues, Dr. Noland Fordyce did recommend against any CTA right now.  However given her chronic vascular findings, does recommend in hospital workup with MRI, etc. for possible stroke/TIA.  In addition this could have been a syncope.  Her EKG is overall reassuring.  Her initial labs in terms of no significant anemia, no clinical concern about infections.  Patient does have a long-standing psychiatric history and history of what sounds like possibly unintentional overdose on narcotics/benzos.  We will add on a UDS.  She does seem awake and alert enough, I do not think that she needs any reversal agent at this point time.   DIFFERENTIAL DIAGNOSIS: Including but not limited to syncope, seizure, TIA, stroke, electrolyte disturbance, acute renal failure, dysrhythmia, etc.  CONSULTATIONS: Dr. Doy Mince,  neurology at the bedside, does recommend hospital workup for possible stroke.  Hospitalist Dr. Margaretmary Eddy for admission.  Patient / Family / Caregiver informed of clinical course, medical decision-making process, and agree with plan.  ___________________________________________   FINAL CLINICAL IMPRESSION(S) / ED DIAGNOSES   Final diagnoses:   Unwitnessed fall  Numbness and tingling of right arm  Left leg paresthesias      ___________________________________________        Note: This dictation was prepared with Dragon dictation. Any transcriptional errors that result from this process are unintentional    Lisa Roca, MD 04/09/17 1359

## 2017-04-09 NOTE — Progress Notes (Signed)
Chaplain responded to a code stroke. Son was at the bedside. Chaplain prayed silently until called way for a RR.   04/09/17 1400  Clinical Encounter Type  Visited With Patient and family together  Visit Type Initial;Spiritual support  Referral From Nurse  Spiritual Encounters  Spiritual Needs Emotional

## 2017-04-09 NOTE — Progress Notes (Signed)
Anticoagulation monitoring(Lovenox):  47 yo female ordered Lovenox 30 mg Q24h  There were no vitals filed for this visit. BMI    Lab Results  Component Value Date   CREATININE 2.06 (H) 04/09/2017   CREATININE 3.05 (H) 03/22/2017   CREATININE 3.82 (H) 03/21/2017   Estimated Creatinine Clearance: 32.3 mL/min (A) (by C-G formula based on SCr of 2.06 mg/dL (H)). Hemoglobin & Hematocrit     Component Value Date/Time   HGB 11.0 (L) 04/09/2017 1307   HGB 12.7 11/15/2015 0856   HCT 33.8 (L) 04/09/2017 1307   HCT 38.2 11/15/2015 0856     Per Protocol for Patient with estCrcl > 30 ml/min and BMI < 40, will transition to Lovenox 40 mg Q24h.

## 2017-04-09 NOTE — Consult Note (Signed)
Referring Physician: Reita Cliche    Chief Complaint: Syncope, paresthesias  HPI: [name] is an 47 y.o. female with multiple medical problems who reports awakening today and being more dizzy than usual.  She fell hitting her arm but was able to text her son.  When he arrived the patient was laying over the bed and lethargic with slurred speech.  Also reports paresthesias in the left leg and right arm.  Code stroke called.  Initial NIHSS of 5.  Patient had a similar presentation about 3 weeks ago.    Date last known well: Date: 04/08/2017 Time last known well: Unable to determine tPA Given: No: Unable to determine LKW  Past Medical History:  Diagnosis Date  . Anemia    iron deficiency anemia  . Aortic arch aneurysm ([place])   . Bipolar disorder ([place])   . BRCA negative 2014  . Colon polyp   . Family history of breast cancer    BRCA neg 2014  . Gastric ulcer 04/27/2011  . Headache    Migraines  . Malignant melanoma of skin of scalp ([place])   . MI, acute, non ST segment elevation ([place])   . Morbid obesity ([place])    a. s/p gastric bypass.  . Myocardial infarction ([place]) 06/03/2015  . Neuromuscular disorder ([place])   . S/P drug eluting coronary stent placement 06/04/2015    Past Surgical History:  Procedure Laterality Date  . APPENDECTOMY    . CARDIAC CATHETERIZATION N/A 11/09/2014   Procedure: Coronary Angiography;  Surgeon: Minna Merritts, MD;  Location: [facility];  Service: Cardiovascular;  Laterality: N/A;  . CARDIAC CATHETERIZATION N/A 11/12/2014   Procedure: Coronary Stent Intervention;  Surgeon: Isaias Cowman, MD;  Location: [facility];  Service: Cardiovascular;  Laterality: N/A;  . CARDIAC CATHETERIZATION N/A 04/18/2015   Procedure: Left Heart Cath and Coronary Angiography;  Surgeon: Minna Merritts, MD;  Location: [facility];  Service: Cardiovascular;  Laterality: N/A;  . CARDIAC CATHETERIZATION Left 06/04/2015   Procedure: Left Heart Cath and  Coronary Angiography;  Surgeon: Wellington Hampshire, MD;  Location: [facility];  Service: Cardiovascular;  Laterality: Left;  . CARDIAC CATHETERIZATION N/A 06/04/2015   Procedure: Coronary Stent Intervention;  Surgeon: Wellington Hampshire, MD;  Location: [facility];  Service: Cardiovascular;  Laterality: N/A;  . CESAREAN SECTION  2001  . CHOLECYSTECTOMY N/A 11/18/2016   Procedure: LAPAROSCOPIC CHOLECYSTECTOMY WITH INTRAOPERATIVE CHOLANGIOGRAM;  Surgeon: Christene Lye, MD;  Location: [facility];  Service: General;  Laterality: N/A;  . COLONOSCOPY WITH PROPOFOL N/A 04/27/2016   Procedure: COLONOSCOPY WITH PROPOFOL;  Surgeon: [name], MD;  Location: [facility];  Service: Endoscopy;  Laterality: N/A;  . CORONARY ANGIOPLASTY    . DILATION AND CURETTAGE OF UTERUS    . ESOPHAGOGASTRODUODENOSCOPY (EGD) WITH PROPOFOL N/A 09/14/2014   Procedure: ESOPHAGOGASTRODUODENOSCOPY (EGD) WITH PROPOFOL;  Surgeon: Josefine Class, MD;  Location: [facility];  Service: Endoscopy;  Laterality: N/A;  . ESOPHAGOGASTRODUODENOSCOPY (EGD) WITH PROPOFOL N/A 04/27/2016   Procedure: ESOPHAGOGASTRODUODENOSCOPY (EGD) WITH PROPOFOL;  Surgeon: [name], MD;  Location: [facility];  Service: Endoscopy;  Laterality: N/A;  Diabetic - oral meds  . GASTRIC BYPASS  09/2009   [facility]   . Left Carotid to sublcavian artery bypass w/ subclavian artery ligation     a. Performed @ [facility].  . MELANOMA EXCISION  2016   Dr. Evorn Gong  . [name]  2002  .  RIGHT OOPHORECTOMY    . SHOULDER ARTHROSCOPY WITH OPEN ROTATOR CUFF REPAIR Right 01/07/2016   Procedure: SHOULDER ARTHROSCOPY WITH DEBRIDMENT, SUBACHROMIAL DECOMPRESSION;  Surgeon: Corky Mull, MD;  Location: [facility];  Service: Orthopedics;  Laterality: Right;  . SHOULDER ARTHROSCOPY WITH OPEN ROTATOR CUFF REPAIR Right 03/16/2017   Procedure: SHOULDER ARTHROSCOPY WITH OPEN ROTATOR CUFF REPAIR POSSIBLE BICEPS TENODESIS;   Surgeon: Corky Mull, MD;  Location: [facility];  Service: Orthopedics;  Laterality: Right;  . TRIGGER FINGER RELEASE Right     Middle Finger    Family History  Problem Relation Age of Onset  . Hypertension Mother   . Anxiety disorder Mother   . Depression Mother   . Bipolar disorder Mother   . Heart disease Mother   . Hyperlipidemia Mother   . Kidney disease Father   . Heart disease Father   . Hypertension Father   . Diabetes Father   . Stroke Father   . Colon cancer Father        dx in his 47's  . Anxiety disorder Father   . Depression Father   . Skin cancer Father   . Kidney disease Sister   . Thyroid nodules Sister   . Hypertension Sister   . Hypertension Sister   . Diabetes Sister   . Hyperlipidemia Sister   . Depression Sister   . Breast cancer Maternal Aunt 72  . Breast cancer Maternal Aunt 57  . Ovarian cancer Cousin   . Colon cancer Cousin   . Breast cancer Other   . Kidney cancer Neg Hx   . Bladder Cancer Neg Hx    Social History:  reports that she quit smoking about 22 years ago. Her smoking use included cigarettes. she has never used smokeless tobacco. She reports that she does not drink alcohol or use drugs.  Allergies:  Allergies  Allergen Reactions  . Tramadol     Mouth feels like it's on fire    Medications: I have reviewed the patient's current medications. Prior to Admission:  Prior to Admission medications   Medication Sig Start Date End Date Taking? Authorizing Provider  ALPRAZolam Duanne Moron) 0.5 MG tablet Take 0.5-1 tablets (0.25-0.5 mg total) by mouth every 6 (six) hours as needed for anxiety. 03/22/17  Yes Wieting, Delfino Lovett, MD  aspirin EC 81 MG tablet Take 1 tablet (81 mg total) by mouth daily. 02/19/15  Yes Hildred Priest, MD  Blood Glucose Monitoring Suppl (ONE TOUCH ULTRA 2) w/Device KIT Use to check blood sugar once a day. Dx. E11.9 03/18/17  Yes [name], MD  buPROPion (WELLBUTRIN XL) 150 MG 24 hr tablet Take 1 tablet  (150 mg total) by mouth daily. 03/18/17  Yes [name], MD  citalopram (CELEXA) 40 MG tablet Take 1 tablet (40 mg total) by mouth daily. 01/13/17  Yes [name], MD  diltiazem (CARDIZEM CD) 120 MG 24 hr capsule Take 1 capsule (120 mg total) by mouth daily. 01/31/16  Yes Minna Merritts, MD  ezetimibe (ZETIA) 10 MG tablet Take 1 tablet (10 mg total) by mouth at bedtime. 03/22/17  Yes Wieting, Richard, MD  gabapentin (NEURONTIN) 300 MG capsule Take 1 capsule (300 mg total) by mouth 2 (two) times daily. 03/22/17 03/22/18 Yes Wieting, Richard, MD  glipiZIDE (GLUCOTROL XL) 2.5 MG 24 hr tablet Take 1 tablet (2.5 mg total) by mouth daily with breakfast. 03/31/17  Yes [name], MD  hyoscyamine (LEVSIN SL) 0.125 MG SL tablet Place 1 tablet (  0.125 mg total) under the tongue every 6 (six) hours as needed. Patient taking differently: Place 0.125 mg under the tongue every 6 (six) hours as needed for cramping.  02/01/17  Yes [name], MD  levothyroxine (SYNTHROID, LEVOTHROID) 25 MCG tablet Take 1 tablet (25 mcg total) daily before breakfast by mouth. 12/18/16  Yes [name], MD  metoprolol succinate (TOPROL-XL) 25 MG 24 hr tablet Take 1 tablet (25 mg total) by mouth daily. 03/16/17  Yes Poggi, Marshall Cork, MD  oxyCODONE (OXY IR/ROXICODONE) 5 MG immediate release tablet Take 0.5 tablets (2.5 mg total) by mouth every 6 (six) hours as needed for moderate pain or severe pain. 03/22/17  Yes Wieting, Richard, MD  pantoprazole (PROTONIX) 40 MG tablet Take 1 tablet (40 mg total) by mouth 2 (two) times daily. 05/11/16  Yes [name], MD  acetaminophen (TYLENOL) 325 MG tablet Take 2 tablets (650 mg total) by mouth every 6 (six) hours as needed for mild pain (or Fever >/= 101). 03/22/17   Loletha Grayer, MD  nitroGLYCERIN (NITROSTAT) 0.4 MG SL tablet Place 1 tablet (0.4 mg total) under the tongue every 5 (five) minutes as needed for chest pain. 04/30/15   Minna Merritts, MD    ROS: History  obtained from the patient  General ROS: negative for - chills, fatigue, fever, night sweats, weight gain or weight loss Psychological ROS: negative for - behavioral disorder, hallucinations, memory difficulties, mood swings or suicidal ideation Ophthalmic ROS: blurry vision ENT ROS: negative for - epistaxis, nasal discharge, oral lesions, sore throat, tinnitus or vertigo Allergy and Immunology ROS: negative for - hives or itchy/watery eyes Hematological and Lymphatic ROS: negative for - bleeding problems, bruising or swollen lymph nodes Endocrine ROS: negative for - galactorrhea, hair pattern changes, polydipsia/polyuria or temperature intolerance Respiratory ROS: negative for - cough, hemoptysis, shortness of breath or wheezing Cardiovascular ROS: negative for - chest pain, dyspnea on exertion, edema or irregular heartbeat Gastrointestinal ROS: negative for - abdominal pain, diarrhea, hematemesis, nausea/vomiting or stool incontinence Genito-Urinary ROS: negative for - dysuria, hematuria, incontinence or urinary frequency/urgency Musculoskeletal ROS: right shoulder pain Neurological ROS: as noted in HPI Dermatological ROS: negative for rash and skin lesion changes  Physical Examination: There were no vitals taken for this visit.  HEENT-  Normocephalic, no lesions, without obvious abnormality.  Normal external eye and conjunctiva.  Normal TM's bilaterally.  Normal auditory canals and external ears. Normal external nose, mucus membranes and septum.  Normal pharynx. Cardiovascular- S1, S2 normal, pulses palpable throughout   Lungs- chest clear, no wheezing, rales, normal symmetric air entry Abdomen- soft, non-tender; bowel sounds normal; no masses,  no organomegaly Extremities- no edema Lymph-no adenopathy palpable Musculoskeletal-pain on palpation of right shoulder, left calf pain Skin-warm and dry, no hyperpigmentation, vitiligo, or suspicious lesions  Neurological Examination   Mental  Status: Lethargic, oriented, thought content appropriate.  Speech fluent without evidence of aphasia with some dysarthria noted.  Able to follow 3 step commands without difficulty. Cranial Nerves: II: Discs flat bilaterally; Visual fields grossly normal, pupils equal, round, reactive to light and accommodation III,IV, VI: ptosis not present, extra-ocular motions intact bilaterally V,VII: smile symmetric, facial light touch sensation normal bilaterally VIII: hearing normal bilaterally IX,X: gag reflex present XI: bilateral shoulder shrug XII: midline tongue extension Motor: Right : Upper extremity   Difficult to test due to pain    Left:     Upper extremity   5/5  Lower extremity   4/5  Lower extremity   4-/5 Tone and bulk:normal tone throughout; no atrophy noted Sensory: Pinprick and light touch decreased in the RUE and LLE Deep Tendon Reflexes: 2+ with absent AJ's bilaterally Plantars: Right: mute   Left: mute Cerebellar: Normal finger-to-nose and normal heel-to-shin testing bilaterally Gait: not tested due to safety concerns   Laboratory Studies:  Basic Metabolic Panel: No results for input(s): NA, K, CL, CO2, GLUCOSE, BUN, CREATININE, CALCIUM, MG, PHOS in the last 168 hours.  Liver Function Tests: No results for input(s): AST, ALT, ALKPHOS, BILITOT, PROT, ALBUMIN in the last 168 hours. No results for input(s): LIPASE, AMYLASE in the last 168 hours. No results for input(s): AMMONIA in the last 168 hours.  CBC: No results for input(s): WBC, NEUTROABS, HGB, HCT, MCV, PLT in the last 168 hours.  Cardiac Enzymes: No results for input(s): CKTOTAL, CKMB, CKMBINDEX, TROPONINI in the last 168 hours.  BNP: Invalid input(s): POCBNP  CBG: Recent Labs  Lab 04/09/17 1246  GLUCAP 169*    Microbiology: Results for orders placed or performed during the hospital encounter of 03/18/17  Blood Culture (routine x 2)     Status: None   Collection Time: 03/18/17  8:08 PM  Result  Value Ref Range Status   Specimen Description BLOOD BLOOD LEFT HAND  Final   Special Requests   Final    BOTTLES DRAWN AEROBIC AND ANAEROBIC Blood Culture adequate volume   Culture   Final    NO GROWTH 5 DAYS Performed at [facility], [address], [place], [place] [address]    Report Status 03/23/2017 FINAL  Final  Blood Culture (routine x 2)     Status: None   Collection Time: 03/18/17  8:08 PM  Result Value Ref Range Status   Specimen Description BLOOD LEFT ANTECUBITAL  Final   Special Requests   Final    BOTTLES DRAWN AEROBIC AND ANAEROBIC Blood Culture results may not be optimal due to an excessive volume of blood received in culture bottles   Culture   Final    NO GROWTH 5 DAYS Performed at [facility], [address], [place], [place] [address]    Report Status 03/23/2017 FINAL  Final  Urine culture     Status: None   Collection Time: 03/18/17  8:08 PM  Result Value Ref Range Status   Specimen Description   Final    URINE, RANDOM Performed at [facility], [address] [place], [place] [address]    Special Requests   Final    NONE Performed at [facility], [address], [place], [place] [address]    Culture   Final    NO GROWTH Performed at [facility], [place] [address], [place], [place] [address]    Report Status 03/20/2017 FINAL  Final  MRSA PCR Screening     Status: None   Collection Time: 03/18/17 11:50 PM  Result Value Ref Range Status   MRSA by PCR NEGATIVE NEGATIVE Final    Comment:        The GeneXpert MRSA Assay (FDA approved for NASAL specimens only), is one component of a comprehensive MRSA colonization surveillance program. It is not intended to diagnose MRSA infection nor to guide or monitor treatment for MRSA infections. Performed at [facility], [address] [place], [place] [address]     Coagulation Studies: No results for input(s): LABPROT, INR in the last 72  hours.  Urinalysis: No results for input(s): COLORURINE, LABSPEC,  PHURINE, GLUCOSEU, HGBUR, BILIRUBINUR, KETONESUR, PROTEINUR, UROBILINOGEN, NITRITE, LEUKOCYTESUR in the last 168 hours.  Invalid input(s): APPERANCEUR  Lipid Panel:    Component Value Date/Time   CHOL 162 12/08/2016 1221   CHOL 159 05/28/2016 1212   CHOL 197 12/30/2013 0355   TRIG 146 12/08/2016 1221   TRIG 261 (H) 12/30/2013 0355   HDL 34 (L) 12/08/2016 1221   HDL 34 (L) 05/28/2016 1212   HDL 28 (L) 12/30/2013 0355   CHOLHDL 4.8 12/08/2016 1221   VLDL 52 (H) 11/09/2014 0454   VLDL 52 (H) 12/30/2013 0355   LDLCALC 95 05/28/2016 1212   LDLCALC 117 (H) 12/30/2013 0355    HgbA1C:  Lab Results  Component Value Date   HGBA1C 5.7 03/18/2017    Urine Drug Screen:      Component Value Date/Time   LABOPIA POSITIVE (A) 06/04/2015 1153   LABOPIA POSITIVE (A) 07/10/2009 2040   COCAINSCRNUR NONE DETECTED 06/04/2015 1153   LABBENZ NONE DETECTED 06/04/2015 1153   LABBENZ NONE DETECTED 07/10/2009 2040   AMPHETMU NONE DETECTED 06/04/2015 1153   AMPHETMU NONE DETECTED 07/10/2009 2040   THCU NONE DETECTED 06/04/2015 1153   THCU NONE DETECTED 07/10/2009 2040   LABBARB NONE DETECTED 06/04/2015 1153   LABBARB  07/10/2009 2040    NONE DETECTED        DRUG SCREEN FOR MEDICAL PURPOSES ONLY.  IF CONFIRMATION IS NEEDED FOR ANY PURPOSE, NOTIFY LAB WITHIN 5 DAYS.        LOWEST DETECTABLE LIMITS FOR URINE DRUG SCREEN Drug Class       Cutoff (ng/mL) Amphetamine      1000 Barbiturate      200 Benzodiazepine   482 Tricyclics       707 Opiates          300 Cocaine          300 THC              50    Alcohol Level: No results for input(s): ETH in the last 168 hours.  Other results: EKG: normal sinus rhythm at 67 bpm.  Imaging: Ct Head Code Stroke Wo Contrast  Result Date: 04/09/2017 CLINICAL DATA:  Code stroke. Subacute neuro deficit. Slurred speech EXAM: CT HEAD WITHOUT CONTRAST TECHNIQUE: Contiguous axial images were  obtained from the base of the skull through the vertex without intravenous contrast. COMPARISON:  CT head 03/18/2017 FINDINGS: Brain: Negative for acute infarct.  Negative for hemorrhage or mass. Extensive chronic white matter changes are stable from the prior study. Chronic infarct right internal capsule stable. Chronic infarct left thalamus stable. Vascular: Atherosclerotic calcification. Negative for acute vascular thrombosis Skull: Negative Sinuses/Orbits: Negative Other: None ASPECTS ([place] Stroke Program Early CT Score) - Ganglionic level infarction (caudate, lentiform nuclei, internal capsule, insula, M1-M3 cortex): 7 - Supraganglionic infarction (M4-M6 cortex): 3 Total score (0-10 with 10 being normal): 10 IMPRESSION: 1. No acute abnormality 2. Extensive chronic microvascular ischemia, stable from the recent CT 3. ASPECTS is 10 4. These results were called by telephone at the time of interpretation on 04/09/2017 at 1:04 pm to Dr. Lisa Roca , who verbally acknowledged these results. Electronically Signed   By: Franchot Gallo M.D.   On: 04/09/2017 13:05    Assessment: 47 y.o. female presenting with an episode of dizziness, slurred speech and left/right extremity numbness.  Patient outside window for tPA.  Had a similar presentation a few weeks ago.  Lab work improved since that time.  Has been having issues  with hypertension.  It appears her antihypertensives have been reinitiated recently.  BP well controlled.  Unclear etiology of fall and current presentation.  On ASA daily and Wellbutrin.      Stroke Risk Factors - none  Plan: 1. HgbA1c, fasting lipid panel 2. MRI of the brain without contrast.  Stroke work up only indicated if imaging indicative of an acute infarct.   3. Continue ASA 4. Orthostatics 5. Telemetry monitoring 6. Frequent neuro checks 7. EEG  Case disucssed with [name], MD Neurology [phone] 04/09/2017, 1:14 PM

## 2017-04-09 NOTE — Progress Notes (Signed)
CODE STROKE- PHARMACY COMMUNICATION   Time CODE STROKE called/page received:1300  Time response to CODE STROKE was made (in person or via phone): 1302 via phone  Time Stroke Kit retrieved from [place] (only if needed):N/A  Name of Provider/Nurse contacted: RN Judson Roch; nothing need from pharmacy at this time.  Pharm Tech contacted to conduct medication reconciliation.   Past Medical History:  Diagnosis Date  . Anemia    iron deficiency anemia  . Aortic arch aneurysm ([place])   . Bipolar disorder ([place])   . BRCA negative 2014  . Colon polyp   . Family history of breast cancer    BRCA neg 2014  . Gastric ulcer 04/27/2011  . Headache    Migraines  . Malignant melanoma of skin of scalp ([place])   . MI, acute, non ST segment elevation ([place])   . Morbid obesity ([place])    a. s/p gastric bypass.  . Myocardial infarction ([place]) 06/03/2015  . Neuromuscular disorder ([place])   . S/P drug eluting coronary stent placement 06/04/2015   Prior to Admission medications   Medication Sig Start Date End Date Taking? Authorizing Provider  acetaminophen (TYLENOL) 325 MG tablet Take 2 tablets (650 mg total) by mouth every 6 (six) hours as needed for mild pain (or Fever >/= 101). 03/22/17   Loletha Grayer, MD  ALPRAZolam Duanne Moron) 0.5 MG tablet Take 0.5-1 tablets (0.25-0.5 mg total) by mouth every 6 (six) hours as needed for anxiety. 03/22/17   Loletha Grayer, MD  aspirin EC 81 MG tablet Take 1 tablet (81 mg total) by mouth daily. 02/19/15   Hildred Priest, MD  Blood Glucose Monitoring Suppl (ONE TOUCH ULTRA 2) w/Device KIT Use to check blood sugar once a day. Dx. E11.9 03/18/17   [name], MD  buPROPion (WELLBUTRIN XL) 150 MG 24 hr tablet Take 1 tablet (150 mg total) by mouth daily. 03/18/17   [name], MD  citalopram (CELEXA) 40 MG tablet Take 1 tablet (40 mg total) by mouth daily. 01/13/17   [name], MD  diltiazem (CARDIZEM CD) 120 MG 24 hr capsule Take 1 capsule (120 mg total) by  mouth daily. 01/31/16   Minna Merritts, MD  ezetimibe (ZETIA) 10 MG tablet Take 1 tablet (10 mg total) by mouth at bedtime. 03/22/17   Loletha Grayer, MD  gabapentin (NEURONTIN) 300 MG capsule Take 1 capsule (300 mg total) by mouth 2 (two) times daily. 03/22/17 03/22/18  Loletha Grayer, MD  glipiZIDE (GLUCOTROL XL) 2.5 MG 24 hr tablet Take 1 tablet (2.5 mg total) by mouth daily with breakfast. 03/31/17   [name], MD  hyoscyamine (LEVSIN SL) 0.125 MG SL tablet Place 1 tablet (0.125 mg total) under the tongue every 6 (six) hours as needed. Patient taking differently: Place 0.125 mg under the tongue every 6 (six) hours as needed for cramping.  02/01/17   [name], MD  levothyroxine (SYNTHROID, LEVOTHROID) 25 MCG tablet Take 1 tablet (25 mcg total) daily before breakfast by mouth. 12/18/16   [name], MD  metoprolol succinate (TOPROL-XL) 25 MG 24 hr tablet Take 1 tablet (25 mg total) by mouth daily. 03/16/17   Poggi, Marshall Cork, MD  nitroGLYCERIN (NITROSTAT) 0.4 MG SL tablet Place 1 tablet (0.4 mg total) under the tongue every 5 (five) minutes as needed for chest pain. 04/30/15   Minna Merritts, MD  oxyCODONE (OXY IR/ROXICODONE) 5 MG immediate release tablet Take 0.5 tablets (2.5 mg total) by mouth every 6 (six)  hours as needed for moderate pain or severe pain. 03/22/17   Loletha Grayer, MD  pantoprazole (PROTONIX) 40 MG tablet Take 1 tablet (40 mg total) by mouth 2 (two) times daily. 05/11/16   [name], MD    Candelaria Stagers ,PharmD Pharmacy Resident  04/09/2017  1:26 PM

## 2017-04-09 NOTE — Progress Notes (Signed)
Family Meeting Note  Advance Directive:yes  Today a meeting took place with the Patient.     The following clinical team members were present during this meeting:MD, ED RN Ulice Dash  The following were discussed:Patient's diagnosis: Syncope, TIA, coronary artery disease, bipolar disorder, diabetes mellitus, chronic kidney disease, Patient's progosis: Unable to determine and Goals for treatment: DNR, both mom and dad are healthcare power of attorney  Additional follow-up to be provided: Hospitalist and postural care  Time spent during discussion:18 MIN  Nicholes Mango, MD

## 2017-04-09 NOTE — Code Documentation (Signed)
Pt arrives from home with son, per son this AM pt was dizzy when she woke up, she then text him at 25 and told him she fell, he came home to find her dizzy with slurred speech, upon arrival to ED code stroke activated in triage, pt taken form triage to CT for non-con head CT and then room 24, pt had right rotater cuff repair beginning of Feb, arrives with brace on right arm, NIHSS 5, complains of left arm and right leg tingling, left leg drift present and right arm drift, no tPA given, pt has hx of similar episode 3 weeks ago per son, son at bedside, report off to [employer]

## 2017-04-09 NOTE — H&P (Addendum)
[facility] NAME: [name]    MR#:  [id number]  DATE OF BIRTH:  Dec 13, 1970  DATE OF ADMISSION:  04/09/2017  PRIMARY CARE PHYSICIAN: [name], MD   REQUESTING/REFERRING PHYSICIAN: dr.Lord  CHIEF COMPLAINT:   Passing out  HISTORY OF PRESENT ILLNESS:  [name]  is a 47 y.o. female with a known history of bipolar disorder, coronary artery disease status post drug-eluting coronary stent placement, iron deficiency anemia Is brought into the ED after she heavy in her legs she was feeling dizzy and had a syncopal episode.  Patient was reporting right upper extremity right lower extremity and left lower extremity heaviness associated with tingling and headache.  Reporting dysphagia.  CT head is negative.  Patient was recently admitted to the hospital in February treated for pneumonia and acute kidney injury and at that point her Neurontin dose was adjusted.  Currently patient denies any headache during my examination and asking for pain medication.  Son at bedside.  As reported by the ED physician patient was seen by neurologist Dr. Doy Mince who has recommended TIA/CVAworkup and overnight admission  PAST MEDICAL HISTORY:   Past Medical History:  Diagnosis Date  . Anemia    iron deficiency anemia  . Aortic arch aneurysm ([place])   . Bipolar disorder ([place])   . BRCA negative 2014  . Colon polyp   . Family history of breast cancer    BRCA neg 2014  . Gastric ulcer 04/27/2011  . Headache    Migraines  . Malignant melanoma of skin of scalp ([place])   . MI, acute, non ST segment elevation ([place])   . Morbid obesity ([place])    a. s/p gastric bypass.  . Myocardial infarction ([place]) 06/03/2015  . Neuromuscular disorder ([place])   . S/P drug eluting coronary stent placement 06/04/2015    PAST SURGICAL HISTOIRY:   Past Surgical History:  Procedure Laterality Date  . APPENDECTOMY    . CARDIAC CATHETERIZATION N/A 11/09/2014    Procedure: Coronary Angiography;  Surgeon: Minna Merritts, MD;  Location: [facility];  Service: Cardiovascular;  Laterality: N/A;  . CARDIAC CATHETERIZATION N/A 11/12/2014   Procedure: Coronary Stent Intervention;  Surgeon: Isaias Cowman, MD;  Location: [facility];  Service: Cardiovascular;  Laterality: N/A;  . CARDIAC CATHETERIZATION N/A 04/18/2015   Procedure: Left Heart Cath and Coronary Angiography;  Surgeon: Minna Merritts, MD;  Location: [facility];  Service: Cardiovascular;  Laterality: N/A;  . CARDIAC CATHETERIZATION Left 06/04/2015   Procedure: Left Heart Cath and Coronary Angiography;  Surgeon: Wellington Hampshire, MD;  Location: [facility];  Service: Cardiovascular;  Laterality: Left;  . CARDIAC CATHETERIZATION N/A 06/04/2015   Procedure: Coronary Stent Intervention;  Surgeon: Wellington Hampshire, MD;  Location: [facility];  Service: Cardiovascular;  Laterality: N/A;  . CESAREAN SECTION  2001  . CHOLECYSTECTOMY N/A 11/18/2016   Procedure: LAPAROSCOPIC CHOLECYSTECTOMY WITH INTRAOPERATIVE CHOLANGIOGRAM;  Surgeon: Christene Lye, MD;  Location: [facility];  Service: General;  Laterality: N/A;  . COLONOSCOPY WITH PROPOFOL N/A 04/27/2016   Procedure: COLONOSCOPY WITH PROPOFOL;  Surgeon: [name], MD;  Location: [facility];  Service: Endoscopy;  Laterality: N/A;  . CORONARY ANGIOPLASTY    . DILATION AND CURETTAGE OF UTERUS    . ESOPHAGOGASTRODUODENOSCOPY (EGD) WITH PROPOFOL N/A 09/14/2014   Procedure: ESOPHAGOGASTRODUODENOSCOPY (EGD) WITH PROPOFOL;  Surgeon: Josefine Class, MD;  Location:  [facility];  Service: Endoscopy;  Laterality: N/A;  . ESOPHAGOGASTRODUODENOSCOPY (EGD) WITH PROPOFOL N/A 04/27/2016   Procedure: ESOPHAGOGASTRODUODENOSCOPY (EGD) WITH PROPOFOL;  Surgeon: [name], MD;  Location: [facility];  Service: Endoscopy;  Laterality: N/A;  Diabetic - oral meds  . GASTRIC BYPASS  09/2009   [facility]   . Left Carotid to sublcavian artery bypass w/ subclavian artery ligation     a. Performed @ [facility].  . MELANOMA EXCISION  2016   Dr. Evorn Gong  . [place]  2002  . RIGHT OOPHORECTOMY    . SHOULDER ARTHROSCOPY WITH OPEN ROTATOR CUFF REPAIR Right 01/07/2016   Procedure: SHOULDER ARTHROSCOPY WITH DEBRIDMENT, SUBACHROMIAL DECOMPRESSION;  Surgeon: Corky Mull, MD;  Location: [facility];  Service: Orthopedics;  Laterality: Right;  . SHOULDER ARTHROSCOPY WITH OPEN ROTATOR CUFF REPAIR Right 03/16/2017   Procedure: SHOULDER ARTHROSCOPY WITH OPEN ROTATOR CUFF REPAIR POSSIBLE BICEPS TENODESIS;  Surgeon: Corky Mull, MD;  Location: [facility];  Service: Orthopedics;  Laterality: Right;  . TRIGGER FINGER RELEASE Right     [place]    SOCIAL HISTORY:   Social History   Tobacco Use  . Smoking status: Former Smoker    Types: Cigarettes    Last attempt to quit: 08/31/1994    Years since quitting: 22.6  . Smokeless tobacco: Never Used  . Tobacco comment: quit 28 years ago  Substance Use Topics  . Alcohol use: No    Alcohol/week: 0.0 oz    FAMILY HISTORY:   Family History  Problem Relation Age of Onset  . Hypertension Mother   . Anxiety disorder Mother   . Depression Mother   . Bipolar disorder Mother   . Heart disease Mother   . Hyperlipidemia Mother   . Kidney disease Father   . Heart disease Father   . Hypertension Father   . Diabetes Father   . Stroke Father   . Colon cancer Father        dx in his 86's  . Anxiety disorder Father   . Depression Father   . Skin cancer Father   . Kidney disease Sister   . Thyroid nodules Sister   . Hypertension Sister   . Hypertension Sister   . Diabetes Sister   . Hyperlipidemia Sister   . Depression Sister   . Breast cancer Maternal Aunt 67  . Breast cancer Maternal Aunt 57  . Ovarian cancer Cousin   . Colon cancer Cousin   . Breast cancer Other   . Kidney cancer Neg Hx   . Bladder Cancer Neg Hx     DRUG  ALLERGIES:   Allergies  Allergen Reactions  . Tramadol     Mouth feels like it's on fire    REVIEW OF SYSTEMS:  CONSTITUTIONAL: No fever, fatigue or weakness.  EYES: No blurred or double vision.  EARS, NOSE, AND THROAT: No tinnitus or ear pain.  RESPIRATORY: No cough, shortness of breath, wheezing or hemoptysis.  CARDIOVASCULAR: No chest pain, orthopnea, edema.  GASTROINTESTINAL: No nausea, vomiting, diarrhea or abdominal pain.  GENITOURINARY: No dysuria, hematuria.  ENDOCRINE: No polyuria, nocturia,  HEMATOLOGY: No anemia, easy bruising or bleeding SKIN: No rash or lesion. MUSCULOSKELETAL: Right leg And left leg heaviness, right shoulder tingling NEUROLOGIC: No tingling, numbness, weakness.  PSYCHIATRY: No anxiety or depression.   MEDICATIONS AT HOME:   Prior to Admission medications   Medication Sig Start Date End Date Taking? Authorizing Provider  acetaminophen (TYLENOL) 325 MG tablet  Take 2 tablets (650 mg total) by mouth every 6 (six) hours as needed for mild pain (or Fever >/= 101). 03/22/17  Yes Wieting, Richard, MD  ALPRAZolam Duanne Moron) 0.5 MG tablet Take 0.5-1 tablets (0.25-0.5 mg total) by mouth every 6 (six) hours as needed for anxiety. 03/22/17  Yes Wieting, Delfino Lovett, MD  aspirin EC 81 MG tablet Take 1 tablet (81 mg total) by mouth daily. 02/19/15  Yes Hildred Priest, MD  Blood Glucose Monitoring Suppl (ONE TOUCH ULTRA 2) w/Device KIT Use to check blood sugar once a day. Dx. E11.9 03/18/17  Yes [name], MD  buPROPion (WELLBUTRIN XL) 150 MG 24 hr tablet Take 1 tablet (150 mg total) by mouth daily. 03/18/17  Yes [name], MD  citalopram (CELEXA) 40 MG tablet Take 1 tablet (40 mg total) by mouth daily. 01/13/17  Yes [name], MD  diltiazem (CARDIZEM CD) 120 MG 24 hr capsule Take 1 capsule (120 mg total) by mouth daily. 01/31/16  Yes Minna Merritts, MD  ezetimibe (ZETIA) 10 MG tablet Take 1 tablet (10 mg total) by mouth at bedtime. 03/22/17   Yes Wieting, Richard, MD  gabapentin (NEURONTIN) 300 MG capsule Take 1 capsule (300 mg total) by mouth 2 (two) times daily. 03/22/17 03/22/18 Yes Wieting, Richard, MD  glipiZIDE (GLUCOTROL XL) 2.5 MG 24 hr tablet Take 1 tablet (2.5 mg total) by mouth daily with breakfast. 03/31/17  Yes Fisher, Kirstie Peri, MD  hyoscyamine (LEVSIN SL) 0.125 MG SL tablet Place 1 tablet (0.125 mg total) under the tongue every 6 (six) hours as needed. Patient taking differently: Place 0.125 mg under the tongue every 6 (six) hours as needed for cramping.  02/01/17  Yes [name], MD  levothyroxine (SYNTHROID, LEVOTHROID) 25 MCG tablet Take 1 tablet (25 mcg total) daily before breakfast by mouth. 12/18/16  Yes [name], MD  losartan (COZAAR) 100 MG tablet Take 100 mg by mouth daily.   Yes [provider]  metoprolol succinate (TOPROL-XL) 25 MG 24 hr tablet Take 1 tablet (25 mg total) by mouth daily. 03/16/17  Yes Poggi, Marshall Cork, MD  nitroGLYCERIN (NITROSTAT) 0.4 MG SL tablet Place 1 tablet (0.4 mg total) under the tongue every 5 (five) minutes as needed for chest pain. 04/30/15  Yes Gollan, Kathlene November, MD  oxyCODONE (OXY IR/ROXICODONE) 5 MG immediate release tablet Take 0.5 tablets (2.5 mg total) by mouth every 6 (six) hours as needed for moderate pain or severe pain. 03/22/17  Yes Wieting, Richard, MD  pantoprazole (PROTONIX) 40 MG tablet Take 1 tablet (40 mg total) by mouth 2 (two) times daily. 05/11/16  Yes [name], MD      VITAL SIGNS:  Blood pressure (!) 144/85, pulse (!) 57, temperature 98.6 F (37 C), resp. rate 13, SpO2 100 %.  PHYSICAL EXAMINATION:  GENERAL:  47 y.o.-year-old patient lying in the bed with no acute distress.  EYES: Pupils equal, round, reactive to light and accommodation. No scleral icterus. Extraocular muscles intact.  HEENT: Head atraumatic, normocephalic. Oropharynx and nasopharynx clear.  NECK:  Supple, no jugular venous distention. No thyroid enlargement, no  tenderness.  LUNGS: Normal breath sounds bilaterally, no wheezing, rales,rhonchi or crepitation. No use of accessory muscles of respiration.  CARDIOVASCULAR: S1, S2 normal. No murmurs, rubs, or gallops.  ABDOMEN: Soft, nontender, nondistended. Bowel sounds present. No organomegaly or mass.  EXTREMITIES: Right shoulder status post surgery no pedal edema, cyanosis, or clubbing.  NEUROLOGIC: Cranial nerves II through XII  are intact. Muscle strength at patient's baseline in all extremities except right upper extremity. Sensation intact. Gait not checked.  PSYCHIATRIC: The patient is alert and oriented x 3.  SKIN: No obvious rash, lesion, or ulcer.   LABORATORY PANEL:   CBC Recent Labs  Lab 04/09/17 1307  WBC 9.4  HGB 11.0*  HCT 33.8*  PLT 202   ------------------------------------------------------------------------------------------------------------------  Chemistries  Recent Labs  Lab 04/09/17 1307  NA 139  K 4.1  CL 105  CO2 25  GLUCOSE 139*  BUN 20  CREATININE 2.06*  CALCIUM 8.6*  AST 27  ALT 35  ALKPHOS 156*  BILITOT 0.5   ------------------------------------------------------------------------------------------------------------------  Cardiac Enzymes Recent Labs  Lab 04/09/17 1307  TROPONINI <0.03   ------------------------------------------------------------------------------------------------------------------  RADIOLOGY:  Ct Head Code Stroke Wo Contrast  Result Date: 04/09/2017 CLINICAL DATA:  Code stroke. Subacute neuro deficit. Slurred speech EXAM: CT HEAD WITHOUT CONTRAST TECHNIQUE: Contiguous axial images were obtained from the base of the skull through the vertex without intravenous contrast. COMPARISON:  CT head 03/18/2017 FINDINGS: Brain: Negative for acute infarct.  Negative for hemorrhage or mass. Extensive chronic white matter changes are stable from the prior study. Chronic infarct right internal capsule stable. Chronic infarct left thalamus  stable. Vascular: Atherosclerotic calcification. Negative for acute vascular thrombosis Skull: Negative Sinuses/Orbits: Negative Other: None ASPECTS ([facility] Stroke Program Early CT Score) - Ganglionic level infarction (caudate, lentiform nuclei, internal capsule, insula, M1-M3 cortex): 7 - Supraganglionic infarction (M4-M6 cortex): 3 Total score (0-10 with 10 being normal): 10 IMPRESSION: 1. No acute abnormality 2. Extensive chronic microvascular ischemia, stable from the recent CT 3. ASPECTS is 10 4. These results were called by telephone at the time of interpretation on 04/09/2017 at 1:04 pm to Dr. Lisa Roca , who verbally acknowledged these results. Electronically Signed   By: Franchot Gallo M.D.   On: 04/09/2017 13:05    EKG:   Orders placed or performed during the hospital encounter of 04/09/17  . EKG 12-[facility]  . EKG 12-[facility]  . ED EKG  . ED EKG    IMPRESSION AND PLAN:   [name]  is a 47 y.o. female with a known history of bipolar disorder, coronary artery disease status post drug-eluting coronary stent placement, iron deficiency anemia Is brought into the ED after she heavy in her legs she was feeling dizzy and had a syncopal episode.  Patient was reporting right upper extremity right lower extremity and left lower extremity heaviness associated with tingling and headache.  Reporting dysphagia.  CT head is negative.  # Syncope -which seem to be chronic from chronic dizziness Patient was told by physicians before that she has to change her positions especially from sitting to standing posture slowly as patient has chronic dizziness and also is on narcotics also she had  right shoulder rotator cuff surgery on February 12 We will check orthostatics CT head is negative and echocardiogram was recently performed in March 19, 2017 carotid Dopplers Hydrate with IV fluids and monitor in telemetry  # TIA/CVA CT head is negative We will get MRI of the brain, carotid  Dopplers Echocardiogram was performed on March 19, 2017 which has revealed 60-65% ejection fraction and no regional wall motion abnormalities Speech therapy evaluation is pending and patient is going to be n.p.o. until speech therapy evaluation PT assessment Neurology consult placed   #Chronic  kidney disease Baseline creatinine seems to be at 3.0 today patient is a 2.0 continue close monitoring and avoid nephrotoxins  #  History of coronary artery disease status post drug-eluting stent placement  troponin negative and patient is a symptomatic. Resume home medications once patient tolerates diet and pass a swallow evaluation  #Diabetes mellitus currently patient is n.p.o. will provide sliding scale insulin   All the records are reviewed and case discussed with ED provider. Management plans discussed with the patient, family and they are in agreement.  CODE STATUS: DNR   TOTAL TIME TAKING CARE OF THIS PATIENT: 42 minutes.   Note: This dictation was prepared with Dragon dictation along with smaller phrase technology. Any transcriptional errors that result from this process are unintentional.  Nicholes Mango M.D on 04/09/2017 at 2:40 PM  Between 7am to 6pm - Pager - [phone]  After 6pm go to [email or web address] - password [email or web address] [facility]  Office  [phone]  CC: Primary care physician; [name], MD

## 2017-04-10 ENCOUNTER — Observation Stay: Payer: Medicare Other

## 2017-04-10 DIAGNOSIS — I6602 Occlusion and stenosis of left middle cerebral artery: Secondary | ICD-10-CM | POA: Diagnosis not present

## 2017-04-10 DIAGNOSIS — G459 Transient cerebral ischemic attack, unspecified: Secondary | ICD-10-CM | POA: Diagnosis not present

## 2017-04-10 DIAGNOSIS — I951 Orthostatic hypotension: Secondary | ICD-10-CM | POA: Diagnosis not present

## 2017-04-10 DIAGNOSIS — R202 Paresthesia of skin: Secondary | ICD-10-CM | POA: Diagnosis not present

## 2017-04-10 DIAGNOSIS — R55 Syncope and collapse: Secondary | ICD-10-CM | POA: Diagnosis not present

## 2017-04-10 DIAGNOSIS — M25511 Pain in right shoulder: Secondary | ICD-10-CM | POA: Diagnosis not present

## 2017-04-10 DIAGNOSIS — F319 Bipolar disorder, unspecified: Secondary | ICD-10-CM | POA: Diagnosis not present

## 2017-04-10 DIAGNOSIS — E119 Type 2 diabetes mellitus without complications: Secondary | ICD-10-CM | POA: Diagnosis not present

## 2017-04-10 DIAGNOSIS — I639 Cerebral infarction, unspecified: Secondary | ICD-10-CM | POA: Diagnosis not present

## 2017-04-10 LAB — GLUCOSE, CAPILLARY
GLUCOSE-CAPILLARY: 67 mg/dL (ref 65–99)
GLUCOSE-CAPILLARY: 131 mg/dL — AB (ref 65–99)
GLUCOSE-CAPILLARY: 44 mg/dL — AB (ref 65–99)
GLUCOSE-CAPILLARY: 114 mg/dL — AB (ref 65–99)
GLUCOSE-CAPILLARY: 38 mg/dL — AB (ref 65–99)
GLUCOSE-CAPILLARY: 115 mg/dL — AB (ref 65–99)
Glucose-Capillary: 37 mg/dL — CL (ref 65–99)
Glucose-Capillary: 79 mg/dL (ref 65–99)
Glucose-Capillary: 46 mg/dL — ABNORMAL LOW (ref 65–99)
Glucose-Capillary: 90 mg/dL (ref 65–99)
Glucose-Capillary: 31 mg/dL — CL (ref 65–99)
Glucose-Capillary: 54 mg/dL — ABNORMAL LOW (ref 65–99)

## 2017-04-10 LAB — LIPID PANEL
Cholesterol: 134 mg/dL (ref 0–200)
HDL: 29 mg/dL — AB (ref >40)
LDL Cholesterol: 88 mg/dL (ref 0–99)
TRIGLYCERIDES: 85 mg/dL (ref <150)
Total CHOL/HDL Ratio: 4.6 RATIO
VLDL: 17 mg/dL (ref 0–40)

## 2017-04-10 MED ORDER — ATORVASTATIN CALCIUM 20 MG PO TABS
40.0000 mg | ORAL_TABLET | Freq: Every day | ORAL | Status: DC
  Administered 2017-04-10 – 2017-04-12 (×3): 40 mg via ORAL
  Filled 2017-04-10 (×3): qty 2

## 2017-04-10 MED ORDER — SODIUM CHLORIDE 0.9 % IV SOLN
INTRAVENOUS | Status: AC
  Administered 2017-04-10: 15:00:00 via INTRAVENOUS

## 2017-04-10 NOTE — Progress Notes (Signed)
Hypoglycemic Event  CBG: 37 Treatment: 4 oz juice  Symptoms: clammy  Follow-up CBG: Time:0215 CBG Results: [place] [name]

## 2017-04-10 NOTE — Progress Notes (Signed)
[facility] [place] NAME: [name]    MR#:  [id number]  DATE OF BIRTH:  06-14-1970  SUBJECTIVE:  CHIEF COMPLAINT: Patient is feeling fine.  Lower extremity weakness and heaviness improved.  Denies any headache.  Dizzy when stands up  REVIEW OF SYSTEMS:  CONSTITUTIONAL: No fever, fatigue or weakness.  EYES: No blurred or double vision.  EARS, NOSE, AND THROAT: No tinnitus or ear pain.  RESPIRATORY: No cough, shortness of breath, wheezing or hemoptysis.  CARDIOVASCULAR: No chest pain, orthopnea, edema.  GASTROINTESTINAL: No nausea, vomiting, diarrhea or abdominal pain.  GENITOURINARY: No dysuria, hematuria.  ENDOCRINE: No polyuria, nocturia,  HEMATOLOGY: No anemia, easy bruising or bleeding SKIN: No rash or lesion. MUSCULOSKELETAL: No joint pain or arthritis.   NEUROLOGIC: No tingling, numbness, weakness.  PSYCHIATRY: No anxiety or depression.   DRUG ALLERGIES:   Allergies  Allergen Reactions  . Tramadol     Mouth feels like it's on fire    VITALS:  Blood pressure (!) 142/69, pulse 63, temperature 97.9 F (36.6 C), temperature source Oral, resp. rate 17, height 5\' 2"  (1.575 m), weight 75.4 kg (166 lb 3.2 oz), SpO2 100 %.  PHYSICAL EXAMINATION:  GENERAL:  47 y.o.-year-old patient lying in the bed with no acute distress.  EYES: Pupils equal, round, reactive to light and accommodation. No scleral icterus. Extraocular muscles intact.  HEENT: Head atraumatic, normocephalic. Oropharynx and nasopharynx clear.  NECK:  Supple, no jugular venous distention. No thyroid enlargement, no tenderness.  LUNGS: Normal breath sounds bilaterally, no wheezing, rales,rhonchi or crepitation. No use of accessory muscles of respiration.  CARDIOVASCULAR: S1, S2 normal. No murmurs, rubs, or gallops.  ABDOMEN: Soft, nontender, nondistended. Bowel sounds present. No organomegaly or mass.  EXTREMITIES: No pedal edema, cyanosis, or clubbing.   NEUROLOGIC: Cranial nerves II through XII are intact. Muscle strength 5/5 in all extremities except right upper extremity. Sensation intact. Gait not checked.  PSYCHIATRIC: The patient is alert and oriented x 3.  SKIN: No obvious rash, lesion, or ulcer.    LABORATORY PANEL:   CBC Recent Labs  Lab 04/09/17 1307  WBC 9.4  HGB 11.0*  HCT 33.8*  PLT 202   ------------------------------------------------------------------------------------------------------------------  Chemistries  Recent Labs  Lab 04/09/17 1307  NA 139  K 4.1  CL 105  CO2 25  GLUCOSE 139*  BUN 20  CREATININE 2.06*  CALCIUM 8.6*  AST 27  ALT 35  ALKPHOS 156*  BILITOT 0.5   ------------------------------------------------------------------------------------------------------------------  Cardiac Enzymes Recent Labs  Lab 04/09/17 1307  TROPONINI <0.03   ------------------------------------------------------------------------------------------------------------------  RADIOLOGY:  Dg Shoulder Right  Result Date: 04/10/2017 CLINICAL DATA:  Right shoulder pain.  Fell yesterday. EXAM: RIGHT SHOULDER - 2+ VIEW COMPARISON:  Right shoulder MRI 03/03/2017 FINDINGS: There is no evidence of acute fracture or dislocation. Mild AC joint spurring is noted. The visualized portion of the right lung is clear. IMPRESSION: No acute osseous abnormality. Electronically Signed   By: Logan Bores M.D.   On: 04/10/2017 11:23   Mr Brain Wo Contrast  Result Date: 04/10/2017 CLINICAL DATA:  Initial evaluation for acute syncope, right upper and lower with left lower extremity heaviness, tingling. EXAM: MRI HEAD WITHOUT CONTRAST MRA HEAD WITHOUT CONTRAST TECHNIQUE: Multiplanar, multiecho pulse sequences of the brain and surrounding structures were obtained without intravenous contrast. Angiographic images of the head were obtained using MRA technique without contrast. COMPARISON:  Prior CT from 04/09/2017 FINDINGS: MRI HEAD FINDINGS  Brain: Generalized  age-related cerebral atrophy. Patchy and confluent T2/FLAIR hyperintensity within the periventricular and deep white matter both cerebral hemispheres, most consistent with chronic small vessel ischemic disease. Multiple scatter remote lacunar infarcts present within the bilateral basal ganglia/corona radiata, as well as the bilateral thalami. Multiple remote lacunar infarcts present within the pons. There is small focus of patchy diffusion abnormality within the subcortical white matter of the anterior right centrum semi ovale measuring 14 mm (series 100, image 35), consistent with an acute to subacute ischemic infarct. Additional mild diffusion abnormality with associated T2/FLAIR signal within the subcortical and deep white matter of the posterior left parietal region also consistent with ischemia, likely subacute in nature (series 100, image 33). No associated hemorrhage or mass effect. No other evidence for acute or subacute ischemia. Gray-white matter differentiation otherwise maintained. No evidence for acute intracranial hemorrhage. Single punctate chronic microhemorrhage noted within the posterior right centrum semi ovale. No other evidence for chronic hemorrhage. No mass lesion, midline shift or mass effect. No hydrocephalus. No extra-axial fluid collection. Major dural sinuses are grossly patent. Pituitary gland suprasellar region normal. Midline structures intact. Vascular: Major intracranial vascular flow voids maintained. Skull and upper cervical spine: Craniocervical junction normal. Bone marrow signal intensity diffusely decreased on T1 weighted imaging, most commonly related to anemia, smoking, or obesity. No scalp soft tissue abnormality. Visualized upper cervical spine unremarkable. Sinuses/Orbits: Globes and orbital soft tissues within normal limits. Scattered mucosal thickening throughout the paranasal sinuses. No mastoid effusion. Inner ear structures normal. Other: None. MRA  HEAD FINDINGS ANTERIOR CIRCULATION: Distal cervical segments of the internal carotid arteries are patent with antegrade flow. Petrous segments widely patent bilaterally. Atheromatous irregularity within the cavernous/supraclinoid left ICA with mild multifocal narrowing. Cavernous and supraclinoid right ICA widely patent. Widely patent right A1 segment. Left A1 hypoplastic and/or absent, accounting for the diminutive left ICA is compared to the right. Normal anterior communicating artery. Anterior cerebral arteries perfused to their distal aspects without flow-limiting stenosis. Patent M1 segments without stenosis. Normal MCA bifurcations. No proximal M2 occlusion. Distal MCA branches well perfused and symmetric. Distal small vessel atheromatous irregularity. POSTERIOR CIRCULATION: Vertebral arteries not well evaluated on this exam. Suggestive dominant right vertebral artery with hypoplastic left vertebral artery. Right PICA partially visualized and patent. Left PICA not evaluated. Basilar artery patent to its distal aspect without stenosis. Superior cerebral arteries patent bilaterally. Left PCA supplied via the basilar. Hypoplastic right P1 with prominent right posterior communicating artery. There are severe tandem bilateral P2 stenoses (series 102, image 7) PCAs otherwise patent to their distal aspects. Distal small vessel atheromatous irregularity. No aneurysm or vascular malformation. IMPRESSION: MRI HEAD IMPRESSION: 1. Patchy small volume acute to subacute ischemic infarcts involving the white matter of the right centrum semi ovale and posterior left parietal region as above. No associated hemorrhage or mass effect. 2. Advanced chronic microvascular ischemic disease for age with multiple remote lacunar infarcts involving the bilateral basal ganglia/corona radiata, thalami, and pons. MRA HEAD IMPRESSION: 1. Negative intracranial MRA for large vessel occlusion. No high-grade proximal or correctable stenosis  identified. 2. Tandem severe left P2 stenoses. 3. Mild-to-moderate distal small vessel atheromatous irregularity. Electronically Signed   By: Jeannine Boga M.D.   On: 04/10/2017 01:43   US Carotid Bilateral (at [facility] Only)  Result Date: 04/09/2017 CLINICAL DATA:  47 year old female with a history of syncope. Cardiovascular risk factors include hypertension, known prior stroke/TIA, hyperlipidemia, diabetes EXAM: BILATERAL CAROTID DUPLEX ULTRASOUND TECHNIQUE: Pearline Cables scale imaging, color Doppler and duplex ultrasound  were performed of bilateral carotid and vertebral arteries in the neck. COMPARISON:  No prior duplex FINDINGS: Criteria: Quantification of carotid stenosis is based on velocity parameters that correlate the residual internal carotid diameter with NASCET-based stenosis levels, using the diameter of the distal internal carotid lumen as the denominator for stenosis measurement. The following velocity measurements were obtained: RIGHT ICA:  Systolic 79 cm/sec, Diastolic 29 cm/sec CCA:  70 cm/sec SYSTOLIC ICA/CCA RATIO:  1.1 ECA:  50 cm/sec LEFT ICA:  Systolic 75 cm/sec, Diastolic 27 cm/sec CCA:  458 cm/sec SYSTOLIC ICA/CCA RATIO:  0.5 ECA:  87 cm/sec Right Brachial SBP: Not acquired Left Brachial SBP: Not acquired RIGHT CAROTID ARTERY: No significant calcifications of the right common carotid artery. Intermediate waveform maintained. Heterogeneous and partially calcified plaque at the right carotid bifurcation. No significant lumen shadowing. Low resistance waveform of the right ICA. No significant tortuosity. RIGHT VERTEBRAL ARTERY: Antegrade flow with low resistance waveform. LEFT CAROTID ARTERY: No significant calcifications of the left common carotid artery. Intermediate waveform maintained. Heterogeneous and partially calcified plaque at the left carotid bifurcation without significant lumen shadowing. Low resistance waveform of the left ICA. No significant tortuosity. LEFT VERTEBRAL ARTERY:   Antegrade flow with low resistance waveform. IMPRESSION: Color duplex indicates minimal heterogeneous and calcified plaque, with no hemodynamically significant stenosis by duplex criteria in the extracranial cerebrovascular circulation. Signed, Dulcy Fanny. Earleen Newport, DO [facility] Electronically Signed   By: Corrie Mckusick D.O.   On: 04/09/2017 16:32   Mr Jodene Nam Head/brain KD Cm  Result Date: 04/10/2017 CLINICAL DATA:  Initial evaluation for acute syncope, right upper and lower with left lower extremity heaviness, tingling. EXAM: MRI HEAD WITHOUT CONTRAST MRA HEAD WITHOUT CONTRAST TECHNIQUE: Multiplanar, multiecho pulse sequences of the brain and surrounding structures were obtained without intravenous contrast. Angiographic images of the head were obtained using MRA technique without contrast. COMPARISON:  Prior CT from 04/09/2017 FINDINGS: MRI HEAD FINDINGS Brain: Generalized age-related cerebral atrophy. Patchy and confluent T2/FLAIR hyperintensity within the periventricular and deep white matter both cerebral hemispheres, most consistent with chronic small vessel ischemic disease. Multiple scatter remote lacunar infarcts present within the bilateral basal ganglia/corona radiata, as well as the bilateral thalami. Multiple remote lacunar infarcts present within the pons. There is small focus of patchy diffusion abnormality within the subcortical white matter of the anterior right centrum semi ovale measuring 14 mm (series 100, image 35), consistent with an acute to subacute ischemic infarct. Additional mild diffusion abnormality with associated T2/FLAIR signal within the subcortical and deep white matter of the posterior left parietal region also consistent with ischemia, likely subacute in nature (series 100, image 33). No associated hemorrhage or mass effect. No other evidence for acute or subacute ischemia. Gray-white matter differentiation otherwise  maintained. No evidence for acute intracranial hemorrhage. Single punctate chronic microhemorrhage noted within the posterior right centrum semi ovale. No other evidence for chronic hemorrhage. No mass lesion, midline shift or mass effect. No hydrocephalus. No extra-axial fluid collection. Major dural sinuses are grossly patent. Pituitary gland suprasellar region normal. Midline structures intact. Vascular: Major intracranial vascular flow voids maintained. Skull and upper cervical spine: Craniocervical junction normal. Bone marrow signal intensity diffusely decreased on T1 weighted imaging, most commonly related to anemia, smoking, or obesity. No scalp soft tissue abnormality. Visualized upper cervical spine unremarkable. Sinuses/Orbits: Globes and orbital soft tissues within normal limits. Scattered mucosal thickening throughout the paranasal sinuses. No mastoid effusion. Inner ear structures normal. Other: None. MRA HEAD FINDINGS  ANTERIOR CIRCULATION: Distal cervical segments of the internal carotid arteries are patent with antegrade flow. Petrous segments widely patent bilaterally. Atheromatous irregularity within the cavernous/supraclinoid left ICA with mild multifocal narrowing. Cavernous and supraclinoid right ICA widely patent. Widely patent right A1 segment. Left A1 hypoplastic and/or absent, accounting for the diminutive left ICA is compared to the right. Normal anterior communicating artery. Anterior cerebral arteries perfused to their distal aspects without flow-limiting stenosis. Patent M1 segments without stenosis. Normal MCA bifurcations. No proximal M2 occlusion. Distal MCA branches well perfused and symmetric. Distal small vessel atheromatous irregularity. POSTERIOR CIRCULATION: Vertebral arteries not well evaluated on this exam. Suggestive dominant right vertebral artery with hypoplastic left vertebral artery. Right PICA partially visualized and patent. Left PICA not evaluated. Basilar artery  patent to its distal aspect without stenosis. Superior cerebral arteries patent bilaterally. Left PCA supplied via the basilar. Hypoplastic right P1 with prominent right posterior communicating artery. There are severe tandem bilateral P2 stenoses (series 102, image 7) PCAs otherwise patent to their distal aspects. Distal small vessel atheromatous irregularity. No aneurysm or vascular malformation. IMPRESSION: MRI HEAD IMPRESSION: 1. Patchy small volume acute to subacute ischemic infarcts involving the white matter of the right centrum semi ovale and posterior left parietal region as above. No associated hemorrhage or mass effect. 2. Advanced chronic microvascular ischemic disease for age with multiple remote lacunar infarcts involving the bilateral basal ganglia/corona radiata, thalami, and pons. MRA HEAD IMPRESSION: 1. Negative intracranial MRA for large vessel occlusion. No high-grade proximal or correctable stenosis identified. 2. Tandem severe left P2 stenoses. 3. Mild-to-moderate distal small vessel atheromatous irregularity. Electronically Signed   By: Jeannine Boga M.D.   On: 04/10/2017 01:43   Ct Head Code Stroke Wo Contrast  Result Date: 04/09/2017 CLINICAL DATA:  Code stroke. Subacute neuro deficit. Slurred speech EXAM: CT HEAD WITHOUT CONTRAST TECHNIQUE: Contiguous axial images were obtained from the base of the skull through the vertex without intravenous contrast. COMPARISON:  CT head 03/18/2017 FINDINGS: Brain: Negative for acute infarct.  Negative for hemorrhage or mass. Extensive chronic white matter changes are stable from the prior study. Chronic infarct right internal capsule stable. Chronic infarct left thalamus stable. Vascular: Atherosclerotic calcification. Negative for acute vascular thrombosis Skull: Negative Sinuses/Orbits: Negative Other: None ASPECTS ([place] Stroke Program Early CT Score) - Ganglionic level infarction (caudate, lentiform nuclei, internal capsule, insula, M1-M3  cortex): 7 - Supraganglionic infarction (M4-M6 cortex): 3 Total score (0-10 with 10 being normal): 10 IMPRESSION: 1. No acute abnormality 2. Extensive chronic microvascular ischemia, stable from the recent CT 3. ASPECTS is 10 4. These results were called by telephone at the time of interpretation on 04/09/2017 at 1:04 pm to Dr. Lisa Roca , who verbally acknowledged these results. Electronically Signed   By: Franchot Gallo M.D.   On: 04/09/2017 13:05    EKG:   Orders placed or performed during the hospital encounter of 04/09/17  . EKG 12-Lead  . EKG 12-Lead  . ED EKG  . ED EKG    ASSESSMENT AND PLAN:    # Syncope secondary to acute CVA and orthostatic hypotension Provide IV fluids and repeat orthostatics CT head is negative and echocardiogram was recently performed in March 19, 2017 carotid Dopplers-no significant stenosis Hydrate with IV fluids and monitor in telemetry  #Acute CVA CT head is negative MRI of the brain-patchy acute versus subacute infarcts ischemic  carotid Dopplers no significant stenosis Echocardiogram was performed on March 19, 2017 which has revealed 60-65% ejection fraction and no  regional wall motion abnormalities Speech therapy elevated the patient and recommending dysphagia 3 diet  PT assessment  discharge patient with aspirin 325 mg and high intensity statin LDL 88  Neurology  recommendations appreciated  #Recent history of rt  shoulder rotator cuff surgery by Dr. [name] of the shoulder with no acute findings Patient was seen and evaluated by Dr. Marry Guan, She should continue with the sling and abduction pillow.  She is cleared for physical therapy as per the stroke rehabilitation, but she should refrain from any active range of motion or strengthening until she is reevaluated by Dr. [name].   #Chronic  kidney disease Baseline creatinine seems to be at 3.0 today patient is a 2.0 continue close monitoring and avoid nephrotoxins PCP may consider  referring the patient to nephrology on outpatient basis  #History of coronary artery disease status post drug-eluting stent placement  troponin negative and patient is a symptomatic. Resume home medications once patient tolerates diet and pass a swallow evaluation  #Diabetes mellitus sliding scale insulin      All the records are reviewed and case discussed with Care Management/Social Workerr. Management plans discussed with the patient, family and they are in agreement.  CODE STATUS: dnr  TOTAL TIME TAKING CARE OF THIS PATIENT: 36  minutes.   POSSIBLE D/C IN 1 DAYS, DEPENDING ON CLINICAL CONDITION.  Note: This dictation was prepared with Dragon dictation along with smaller phrase technology. Any transcriptional errors that result from this process are unintentional.   Nicholes Mango M.D on 04/10/2017 at 2:12 PM  Between 7am to 6pm - Pager - [phone] After 6pm go to [email or web address] - password [email or web address] [facility]  Office  [phone]  CC: Primary care physician; [name], MD

## 2017-04-10 NOTE — Plan of Care (Signed)
  Safety: Ability to remain free from injury will improve 04/10/2017 1453 - Progressing by Oris Drone, RN  PT Moderate Fall Risks; refuses bed alarm this shift; pt able to get oob to bathroom independently Education: Knowledge of secondary prevention will improve 04/10/2017 1453 - Progressing by Oris Drone, RN  Pt verbalized that Dr Irish Elders advised her to drink Gatorade instead of water in order not to deplete her elements

## 2017-04-10 NOTE — Consult Note (Signed)
Referring Physician: Reita Cliche    Chief Complaint: Syncope, paresthesias  States she is at baseline at this time.    Past Medical History:  Diagnosis Date  . Anemia    iron deficiency anemia  . 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 Bipolar disorder ([place])   . BRCA negative 2014  . Colon polyp   . Family history of breast cancer    BRCA neg 2014  . Gastric ulcer 04/27/2011  . Headache    Migraines  . Malignant melanoma of skin of scalp ([place])   . MI, acute, non ST segment elevation ([place])   . Morbid obesity ([place])    a. s/p gastric bypass.  . Myocardial infarction ([place]) 06/03/2015  . Neuromuscular disorder ([place])   . S/P drug eluting coronary stent placement 06/04/2015    Past Surgical History:  Procedure Laterality Date  . APPENDECTOMY    . CARDIAC CATHETERIZATION N/A 11/09/2014   Procedure: Coronary Angiography;  Surgeon: Minna Merritts, MD;  Location: [place] [facility];  Service: Cardiovascular;  Laterality: N/A;  . CARDIAC CATHETERIZATION N/A 11/12/2014   Procedure: Coronary Stent Intervention;  Surgeon: Isaias Cowman, MD;  Location: [place] [facility];  Service: Cardiovascular;  Laterality: N/A;  . CARDIAC CATHETERIZATION N/A 04/18/2015   Procedure: Left Heart Cath and Coronary Angiography;  Surgeon: Minna Merritts, MD;  Location: [place] [facility];  Service: Cardiovascular;  Laterality: N/A;  . CARDIAC CATHETERIZATION Left 06/04/2015   Procedure: Left Heart Cath and Coronary Angiography;  Surgeon: Wellington Hampshire, MD;  Location: [place] [facility];  Service: Cardiovascular;  Laterality: Left;  . CARDIAC CATHETERIZATION N/A 06/04/2015   Procedure: Coronary Stent Intervention;  Surgeon: Wellington Hampshire, MD;  Location: [place] [facility];  Service: Cardiovascular;  Laterality: N/A;  . CESAREAN SECTION  2001  . CHOLECYSTECTOMY N/A 11/18/2016   Procedure: LAPAROSCOPIC CHOLECYSTECTOMY WITH INTRAOPERATIVE CHOLANGIOGRAM;  Surgeon: Christene Lye, MD;  Location: [facility]  ORS;  Service: General;  Laterality: N/A;  . COLONOSCOPY WITH PROPOFOL N/A 04/27/2016   Procedure: COLONOSCOPY WITH PROPOFOL;  Surgeon: [name], MD;  Location: [place];  Service: Endoscopy;  Laterality: N/A;  . CORONARY ANGIOPLASTY    . DILATION AND CURETTAGE OF UTERUS    . ESOPHAGOGASTRODUODENOSCOPY (EGD) WITH PROPOFOL N/A 09/14/2014   Procedure: ESOPHAGOGASTRODUODENOSCOPY (EGD) WITH PROPOFOL;  Surgeon: Josefine Class, MD;  Location: [facility] ENDOSCOPY;  Service: Endoscopy;  Laterality: N/A;  . ESOPHAGOGASTRODUODENOSCOPY (EGD) WITH PROPOFOL N/A 04/27/2016   Procedure: ESOPHAGOGASTRODUODENOSCOPY (EGD) WITH PROPOFOL;  Surgeon: [name], MD;  Location: [place];  Service: Endoscopy;  Laterality: N/A;  Diabetic - oral meds  . GASTRIC BYPASS  09/2009   [facility]   . Left Carotid to sublcavian artery bypass w/ subclavian artery ligation     a. Performed @ [facility].  . MELANOMA EXCISION  2016   Dr. Evorn Gong  . [place]  2002  . RIGHT OOPHORECTOMY    . SHOULDER ARTHROSCOPY WITH OPEN ROTATOR CUFF REPAIR Right 01/07/2016   Procedure: SHOULDER ARTHROSCOPY WITH DEBRIDMENT, SUBACHROMIAL DECOMPRESSION;  Surgeon: Corky Mull, MD;  Location: [facility] ORS;  Service: Orthopedics;  Laterality: Right;  . SHOULDER ARTHROSCOPY WITH OPEN ROTATOR CUFF REPAIR Right 03/16/2017   Procedure: SHOULDER ARTHROSCOPY WITH OPEN ROTATOR CUFF REPAIR POSSIBLE BICEPS TENODESIS;  Surgeon: Corky Mull, MD;  Location: [facility] ORS;  Service: Orthopedics;  Laterality: Right;  . TRIGGER FINGER RELEASE Right     [place]  Family History  Problem Relation Age of Onset  . Hypertension Mother   . Anxiety disorder Mother   . Depression Mother   . Bipolar disorder Mother   . Heart disease Mother   . Hyperlipidemia Mother   . Kidney disease Father   . Heart disease Father   . Hypertension Father   . Diabetes Father   . Stroke Father   . Colon cancer Father        dx in his  44's  . Anxiety disorder Father   . Depression Father   . Skin cancer Father   . Kidney disease Sister   . Thyroid nodules Sister   . Hypertension Sister   . Hypertension Sister   . Diabetes Sister   . Hyperlipidemia Sister   . Depression Sister   . Breast cancer Maternal Aunt 74  . Breast cancer Maternal Aunt 42  . Ovarian cancer Cousin   . Colon cancer Cousin   . Breast cancer Other   . Kidney cancer Neg Hx   . Bladder Cancer Neg Hx    Social History:  reports that she quit smoking about 22 years ago. Her smoking use included cigarettes. she has never used smokeless tobacco. She reports that she does not drink alcohol or use drugs.  Allergies:  Allergies  Allergen Reactions  . Tramadol     Mouth feels like it's on fire    Medications: I have reviewed the patient's current medications. Prior to Admission:  Prior to Admission medications   Medication Sig Start Date End Date Taking? Authorizing Provider  ALPRAZolam Duanne Moron) 0.5 MG tablet Take 0.5-1 tablets (0.25-0.5 mg total) by mouth every 6 (six) hours as needed for anxiety. 03/22/17  Yes Wieting, Delfino Lovett, MD  aspirin EC 81 MG tablet Take 1 tablet (81 mg total) by mouth daily. 02/19/15  Yes Hildred Priest, MD  Blood Glucose Monitoring Suppl (ONE TOUCH ULTRA 2) w/Device KIT Use to check blood sugar once a day. Dx. E11.9 03/18/17  Yes [name], MD  buPROPion (WELLBUTRIN XL) 150 MG 24 hr tablet Take 1 tablet (150 mg total) by mouth daily. 03/18/17  Yes [name], MD  citalopram (CELEXA) 40 MG tablet Take 1 tablet (40 mg total) by mouth daily. 01/13/17  Yes [name], MD  diltiazem (CARDIZEM CD) 120 MG 24 hr capsule Take 1 capsule (120 mg total) by mouth daily. 01/31/16  Yes Minna Merritts, MD  ezetimibe (ZETIA) 10 MG tablet Take 1 tablet (10 mg total) by mouth at bedtime. 03/22/17  Yes Wieting, Richard, MD  gabapentin (NEURONTIN) 300 MG capsule Take 1 capsule (300 mg total) by mouth 2 (two) times  daily. 03/22/17 03/22/18 Yes Wieting, Richard, MD  glipiZIDE (GLUCOTROL XL) 2.5 MG 24 hr tablet Take 1 tablet (2.5 mg total) by mouth daily with breakfast. 03/31/17  Yes Fisher, Kirstie Peri, MD  hyoscyamine (LEVSIN SL) 0.125 MG SL tablet Place 1 tablet (0.125 mg total) under the tongue every 6 (six) hours as needed. Patient taking differently: Place 0.125 mg under the tongue every 6 (six) hours as needed for cramping.  02/01/17  Yes [name], MD  levothyroxine (SYNTHROID, LEVOTHROID) 25 MCG tablet Take 1 tablet (25 mcg total) daily before breakfast by mouth. 12/18/16  Yes [name], MD  metoprolol succinate (TOPROL-XL) 25 MG 24 hr tablet Take 1 tablet (25 mg total) by mouth daily. 03/16/17  Yes Poggi, Marshall Cork, MD  oxyCODONE (OXY IR/ROXICODONE) 5 MG immediate release  tablet Take 0.5 tablets (2.5 mg total) by mouth every 6 (six) hours as needed for moderate pain or severe pain. 03/22/17  Yes Wieting, Richard, MD  pantoprazole (PROTONIX) 40 MG tablet Take 1 tablet (40 mg total) by mouth 2 (two) times daily. 05/11/16  Yes [name], MD  acetaminophen (TYLENOL) 325 MG tablet Take 2 tablets (650 mg total) by mouth every 6 (six) hours as needed for mild pain (or Fever >/= 101). 03/22/17   Loletha Grayer, MD  nitroGLYCERIN (NITROSTAT) 0.4 MG SL tablet Place 1 tablet (0.4 mg total) under the tongue every 5 (five) minutes as needed for chest pain. 04/30/15   Minna Merritts, MD    ROS: History obtained from the patient  General ROS: negative for - chills, fatigue, fever, night sweats, weight gain or weight loss Psychological ROS: negative for - behavioral disorder, hallucinations, memory difficulties, mood swings or suicidal ideation Ophthalmic ROS: blurry vision ENT ROS: negative for - epistaxis, nasal discharge, oral lesions, sore throat, tinnitus or vertigo Allergy and Immunology ROS: negative for - hives or itchy/watery eyes Hematological and Lymphatic ROS: negative for - bleeding  problems, bruising or swollen lymph nodes Endocrine ROS: negative for - galactorrhea, hair pattern changes, polydipsia/polyuria or temperature intolerance Respiratory ROS: negative for - cough, hemoptysis, shortness of breath or wheezing Cardiovascular ROS: negative for - chest pain, dyspnea on exertion, edema or irregular heartbeat Gastrointestinal ROS: negative for - abdominal pain, diarrhea, hematemesis, nausea/vomiting or stool incontinence Genito-Urinary ROS: negative for - dysuria, hematuria, incontinence or urinary frequency/urgency Musculoskeletal ROS: right shoulder pain Neurological ROS: as noted in HPI Dermatological ROS: negative for rash and skin lesion changes  Physical Examination: Blood pressure (!) 142/69, pulse 63, temperature 97.9 F (36.6 C), temperature source Oral, resp. rate 17, height '5\' 2"'  (1.575 m), weight 166 lb 3.2 oz (75.4 kg), SpO2 100 %.  HEENT-  Normocephalic, no lesions, without obvious abnormality.  Normal external eye and conjunctiva.  Normal TM's bilaterally.  Normal auditory canals and external ears. Normal external nose, mucus membranes and septum.  Normal pharynx. Cardiovascular- S1, S2 normal, pulses palpable throughout   Lungs- chest clear, no wheezing, rales, normal symmetric air entry Abdomen- soft, non-tender; bowel sounds normal; no masses,  no organomegaly Extremities- no edema Lymph-no adenopathy palpable Musculoskeletal-pain on palpation of right shoulder, left calf pain Skin-warm and dry, no hyperpigmentation, vitiligo, or suspicious lesions  Neurological Examination   Mental Status: Lethargic, oriented, thought content appropriate.  Speech fluent without evidence of aphasia with some dysarthria noted.  Able to follow 3 step commands without difficulty. Cranial Nerves: II: Discs flat bilaterally; Visual fields grossly normal, pupils equal, round, reactive to light and accommodation III,IV, VI: ptosis not present, extra-ocular motions intact  bilaterally V,VII: smile symmetric, facial light touch sensation normal bilaterally VIII: hearing normal bilaterally IX,X: gag reflex present XI: bilateral shoulder shrug XII: midline tongue extension Motor: Right : Upper extremity   Difficult to test due to pain    Left:     Upper extremity   5/5  Lower extremity   4/5     Lower extremity   4-/5 Tone and bulk:normal tone throughout; no atrophy noted Sensory: Pinprick and light touch decreased in the RUE and LLE Deep Tendon Reflexes: 2+ with absent AJ's bilaterally Plantars: Right: mute   Left: mute Cerebellar: Normal finger-to-nose and normal heel-to-shin testing bilaterally Gait: not tested due to safety concerns   Laboratory Studies:  Basic Metabolic Panel: Recent Labs  Lab 04/09/17 1307  NA 139  K 4.1  CL 105  CO2 25  GLUCOSE 139*  BUN 20  CREATININE 2.06*  CALCIUM 8.6*    Liver Function Tests: Recent Labs  Lab 04/09/17 1307  AST 27  ALT 35  ALKPHOS 156*  BILITOT 0.5  PROT 6.5  ALBUMIN 3.6   No results for input(s): LIPASE, AMYLASE in the last 168 hours. No results for input(s): AMMONIA in the last 168 hours.  CBC: Recent Labs  Lab 04/09/17 1307  WBC 9.4  NEUTROABS 6.5  HGB 11.0*  HCT 33.8*  MCV 84.3  PLT 202    Cardiac Enzymes: Recent Labs  Lab 04/09/17 1307  TROPONINI <0.03    BNP: Invalid input(s): POCBNP  CBG: Recent Labs  Lab 04/10/17 0345 04/10/17 0432 04/10/17 0803 04/10/17 0824 04/10/17 1229  GLUCAP 46* 67 44* 131* 114*    Microbiology: Results for orders placed or performed during the hospital encounter of 03/18/17  Blood Culture (routine x 2)     Status: None   Collection Time: 03/18/17  8:08 PM  Result Value Ref Range Status   Specimen Description BLOOD BLOOD LEFT HAND  Final   Special Requests   Final    BOTTLES DRAWN AEROBIC AND ANAEROBIC Blood Culture adequate volume   Culture   Final    NO GROWTH 5 DAYS Performed at [facility], [address], [place], [place] [address]    Report Status 03/23/2017 FINAL  Final  Blood Culture (routine x 2)     Status: None   Collection Time: 03/18/17  8:08 PM  Result Value Ref Range Status   Specimen Description BLOOD LEFT ANTECUBITAL  Final   Special Requests   Final    BOTTLES DRAWN AEROBIC AND ANAEROBIC Blood Culture results may not be optimal due to an excessive volume of blood received in culture bottles   Culture   Final    NO GROWTH 5 DAYS Performed at [facility], [address] [place], [place] [address]    Report Status 03/23/2017 FINAL  Final  Urine culture     Status: None   Collection Time: 03/18/17  8:08 PM  Result Value Ref Range Status   Specimen Description   Final    URINE, RANDOM Performed at [facility], [address], [place], [place] [address]    Special Requests   Final    NONE Performed at [facility], [address], [place], [place] [address]    Culture   Final    NO GROWTH Performed at [facility], [place] [address], [place], [place] [address]    Report Status 03/20/2017 FINAL  Final  MRSA PCR Screening     Status: None   Collection Time: 03/18/17 11:50 PM  Result Value Ref Range Status   MRSA by PCR NEGATIVE NEGATIVE Final    Comment:        The GeneXpert MRSA Assay (FDA approved for NASAL specimens only), is one component of a comprehensive MRSA colonization surveillance program. It is not intended to diagnose MRSA infection nor to guide or monitor treatment for MRSA infections. Performed at [facility], [address] [place],  [address]     Coagulation Studies: Recent Labs    04/09/17 1307  LABPROT 13.4  INR 1.03    Urinalysis: No results for input(s): COLORURINE, LABSPEC, PHURINE, GLUCOSEU, HGBUR, BILIRUBINUR, KETONESUR, PROTEINUR, UROBILINOGEN, NITRITE, LEUKOCYTESUR in the last 168 hours.  Invalid input(s): APPERANCEUR  Lipid Panel:  Component Value Date/Time   CHOL 134  04/10/2017 0518   CHOL 159 05/28/2016 1212   CHOL 197 12/30/2013 0355   TRIG 85 04/10/2017 0518   TRIG 261 (H) 12/30/2013 0355   HDL 29 (L) 04/10/2017 0518   HDL 34 (L) 05/28/2016 1212   HDL 28 (L) 12/30/2013 0355   CHOLHDL 4.6 04/10/2017 0518   VLDL 17 04/10/2017 0518   VLDL 52 (H) 12/30/2013 0355   LDLCALC 88 04/10/2017 0518   LDLCALC 95 05/28/2016 1212   LDLCALC 117 (H) 12/30/2013 0355    HgbA1C:  Lab Results  Component Value Date   HGBA1C 5.7 03/18/2017    Urine Drug Screen:      Component Value Date/Time   LABOPIA POSITIVE (A) 06/04/2015 1153   LABOPIA POSITIVE (A) 07/10/2009 2040   COCAINSCRNUR NONE DETECTED 06/04/2015 1153   LABBENZ NONE DETECTED 06/04/2015 1153   LABBENZ NONE DETECTED 07/10/2009 2040   AMPHETMU NONE DETECTED 06/04/2015 1153   AMPHETMU NONE DETECTED 07/10/2009 2040   THCU NONE DETECTED 06/04/2015 1153   THCU NONE DETECTED 07/10/2009 2040   LABBARB NONE DETECTED 06/04/2015 1153   LABBARB  07/10/2009 2040    NONE DETECTED        DRUG SCREEN FOR MEDICAL PURPOSES ONLY.  IF CONFIRMATION IS NEEDED FOR ANY PURPOSE, NOTIFY LAB WITHIN 5 DAYS.        LOWEST DETECTABLE LIMITS FOR URINE DRUG SCREEN Drug Class       Cutoff (ng/mL) Amphetamine      1000 Barbiturate      200 Benzodiazepine   500 Tricyclics       370 Opiates          300 Cocaine          300 THC              50    Alcohol Level: No results for input(s): ETH in the last 168 hours.  Other results: EKG: normal sinus rhythm at 67 bpm.  Imaging: Dg Shoulder Right  Result Date: 04/10/2017 CLINICAL DATA:  Right shoulder pain.  Fell yesterday. EXAM: RIGHT SHOULDER - 2+ VIEW COMPARISON:  Right shoulder MRI 03/03/2017 FINDINGS: There is no evidence of acute fracture or dislocation. Mild AC joint spurring is noted. The visualized portion of the right lung is clear. IMPRESSION: No acute osseous abnormality. Electronically Signed   By: Logan Bores M.D.   On: 04/10/2017 11:23   Mr Brain Wo  Contrast  Result Date: 04/10/2017 CLINICAL DATA:  Initial evaluation for acute syncope, right upper and lower with left lower extremity heaviness, tingling. EXAM: MRI HEAD WITHOUT CONTRAST MRA HEAD WITHOUT CONTRAST TECHNIQUE: Multiplanar, multiecho pulse sequences of the brain and surrounding structures were obtained without intravenous contrast. Angiographic images of the head were obtained using MRA technique without contrast. COMPARISON:  Prior CT from 04/09/2017 FINDINGS: MRI HEAD FINDINGS Brain: Generalized age-related cerebral atrophy. Patchy and confluent T2/FLAIR hyperintensity within the periventricular and deep white matter both cerebral hemispheres, most consistent with chronic small vessel ischemic disease. Multiple scatter remote lacunar infarcts present within the bilateral basal ganglia/corona radiata, as well as the bilateral thalami. Multiple remote lacunar infarcts present within the pons. There is small focus of patchy diffusion abnormality within the subcortical white matter of the anterior right centrum semi ovale measuring 14 mm (series 100, image 35), consistent with an acute to subacute ischemic infarct. Additional mild diffusion abnormality with associated T2/FLAIR signal within the subcortical and deep white matter of the posterior  left parietal region also consistent with ischemia, likely subacute in nature (series 100, image 33). No associated hemorrhage or mass effect. No other evidence for acute or subacute ischemia. Gray-white matter differentiation otherwise maintained. No evidence for acute intracranial hemorrhage. Single punctate chronic microhemorrhage noted within the posterior right centrum semi ovale. No other evidence for chronic hemorrhage. No mass lesion, midline shift or mass effect. No hydrocephalus. No extra-axial fluid collection. Major dural sinuses are grossly patent. Pituitary gland suprasellar region normal. Midline structures intact. Vascular: Major intracranial  vascular flow voids maintained. Skull and upper cervical spine: Craniocervical junction normal. Bone marrow signal intensity diffusely decreased on T1 weighted imaging, most commonly related to anemia, smoking, or obesity. No scalp soft tissue abnormality. Visualized upper cervical spine unremarkable. Sinuses/Orbits: Globes and orbital soft tissues within normal limits. Scattered mucosal thickening throughout the paranasal sinuses. No mastoid effusion. Inner ear structures normal. Other: None. MRA HEAD FINDINGS ANTERIOR CIRCULATION: Distal cervical segments of the internal carotid arteries are patent with antegrade flow. Petrous segments widely patent bilaterally. Atheromatous irregularity within the cavernous/supraclinoid left ICA with mild multifocal narrowing. Cavernous and supraclinoid right ICA widely patent. Widely patent right A1 segment. Left A1 hypoplastic and/or absent, accounting for the diminutive left ICA is compared to the right. Normal anterior communicating artery. Anterior cerebral arteries perfused to their distal aspects without flow-limiting stenosis. Patent M1 segments without stenosis. Normal MCA bifurcations. No proximal M2 occlusion. Distal MCA branches well perfused and symmetric. Distal small vessel atheromatous irregularity. POSTERIOR CIRCULATION: Vertebral arteries not well evaluated on this exam. Suggestive dominant right vertebral artery with hypoplastic left vertebral artery. Right PICA partially visualized and patent. Left PICA not evaluated. Basilar artery patent to its distal aspect without stenosis. Superior cerebral arteries patent bilaterally. Left PCA supplied via the basilar. Hypoplastic right P1 with prominent right posterior communicating artery. There are severe tandem bilateral P2 stenoses (series 102, image 7) PCAs otherwise patent to their distal aspects. Distal small vessel atheromatous irregularity. No aneurysm or vascular malformation. IMPRESSION: MRI HEAD IMPRESSION:  1. Patchy small volume acute to subacute ischemic infarcts involving the white matter of the right centrum semi ovale and posterior left parietal region as above. No associated hemorrhage or mass effect. 2. Advanced chronic microvascular ischemic disease for age with multiple remote lacunar infarcts involving the bilateral basal ganglia/corona radiata, thalami, and pons. MRA HEAD IMPRESSION: 1. Negative intracranial MRA for large vessel occlusion. No high-grade proximal or correctable stenosis identified. 2. Tandem severe left P2 stenoses. 3. Mild-to-moderate distal small vessel atheromatous irregularity. Electronically Signed   By: Jeannine Boga M.D.   On: 04/10/2017 01:43   US Carotid Bilateral (at [facility] And [facility] Only)  Result Date: 04/09/2017 CLINICAL DATA:  47 year old female with a history of syncope. Cardiovascular risk factors include hypertension, known prior stroke/TIA, hyperlipidemia, diabetes EXAM: BILATERAL CAROTID DUPLEX ULTRASOUND TECHNIQUE: Pearline Cables scale imaging, color Doppler and duplex ultrasound were performed of bilateral carotid and vertebral arteries in the neck. COMPARISON:  No prior duplex FINDINGS: Criteria: Quantification of carotid stenosis is based on velocity parameters that correlate the residual internal carotid diameter with NASCET-based stenosis levels, using the diameter of the distal internal carotid lumen as the denominator for stenosis measurement. The following velocity measurements were obtained: RIGHT ICA:  Systolic 79 cm/sec, Diastolic 29 cm/sec CCA:  70 cm/sec SYSTOLIC ICA/CCA RATIO:  1.1 ECA:  50 cm/sec LEFT ICA:  Systolic 75 cm/sec, Diastolic 27 cm/sec CCA:  631 cm/sec SYSTOLIC ICA/CCA RATIO:  0.5 ECA:  87 cm/sec Right Brachial  SBP: Not acquired Left Brachial SBP: Not acquired RIGHT CAROTID ARTERY: No significant calcifications of the right common carotid artery. Intermediate waveform maintained. Heterogeneous and partially calcified plaque at the right carotid  bifurcation. No significant lumen shadowing. Low resistance waveform of the right ICA. No significant tortuosity. RIGHT VERTEBRAL ARTERY: Antegrade flow with low resistance waveform. LEFT CAROTID ARTERY: No significant calcifications of the left common carotid artery. Intermediate waveform maintained. Heterogeneous and partially calcified plaque at the left carotid bifurcation without significant lumen shadowing. Low resistance waveform of the left ICA. No significant tortuosity. LEFT VERTEBRAL ARTERY:  Antegrade flow with low resistance waveform. IMPRESSION: Color duplex indicates minimal heterogeneous and calcified plaque, with no hemodynamically significant stenosis by duplex criteria in the extracranial cerebrovascular circulation. Signed, Dulcy Fanny. Earleen Newport, DO [facility] Electronically Signed   By: Corrie Mckusick D.O.   On: 04/09/2017 16:32   Mr Jodene Nam Head/brain EH Cm  Result Date: 04/10/2017 CLINICAL DATA:  Initial evaluation for acute syncope, right upper and lower with left lower extremity heaviness, tingling. EXAM: MRI HEAD WITHOUT CONTRAST MRA HEAD WITHOUT CONTRAST TECHNIQUE: Multiplanar, multiecho pulse sequences of the brain and surrounding structures were obtained without intravenous contrast. Angiographic images of the head were obtained using MRA technique without contrast. COMPARISON:  Prior CT from 04/09/2017 FINDINGS: MRI HEAD FINDINGS Brain: Generalized age-related cerebral atrophy. Patchy and confluent T2/FLAIR hyperintensity within the periventricular and deep white matter both cerebral hemispheres, most consistent with chronic small vessel ischemic disease. Multiple scatter remote lacunar infarcts present within the bilateral basal ganglia/corona radiata, as well as the bilateral thalami. Multiple remote lacunar infarcts present within the pons. There is small focus of patchy diffusion abnormality within the subcortical white matter of  the anterior right centrum semi ovale measuring 14 mm (series 100, image 35), consistent with an acute to subacute ischemic infarct. Additional mild diffusion abnormality with associated T2/FLAIR signal within the subcortical and deep white matter of the posterior left parietal region also consistent with ischemia, likely subacute in nature (series 100, image 33). No associated hemorrhage or mass effect. No other evidence for acute or subacute ischemia. Gray-white matter differentiation otherwise maintained. No evidence for acute intracranial hemorrhage. Single punctate chronic microhemorrhage noted within the posterior right centrum semi ovale. No other evidence for chronic hemorrhage. No mass lesion, midline shift or mass effect. No hydrocephalus. No extra-axial fluid collection. Major dural sinuses are grossly patent. Pituitary gland suprasellar region normal. Midline structures intact. Vascular: Major intracranial vascular flow voids maintained. Skull and upper cervical spine: Craniocervical junction normal. Bone marrow signal intensity diffusely decreased on T1 weighted imaging, most commonly related to anemia, smoking, or obesity. No scalp soft tissue abnormality. Visualized upper cervical spine unremarkable. Sinuses/Orbits: Globes and orbital soft tissues within normal limits. Scattered mucosal thickening throughout the paranasal sinuses. No mastoid effusion. Inner ear structures normal. Other: None. MRA HEAD FINDINGS ANTERIOR CIRCULATION: Distal cervical segments of the internal carotid arteries are patent with antegrade flow. Petrous segments widely patent bilaterally. Atheromatous irregularity within the cavernous/supraclinoid left ICA with mild multifocal narrowing. Cavernous and supraclinoid right ICA widely patent. Widely patent right A1 segment. Left A1 hypoplastic and/or absent, accounting for the diminutive left ICA is compared to the right. Normal anterior communicating artery. Anterior cerebral  arteries perfused to their distal aspects without flow-limiting stenosis. Patent M1 segments without stenosis. Normal MCA bifurcations. No proximal M2 occlusion. Distal MCA branches well perfused and symmetric. Distal small vessel atheromatous irregularity. POSTERIOR CIRCULATION: Vertebral arteries not well  evaluated on this exam. Suggestive dominant right vertebral artery with hypoplastic left vertebral artery. Right PICA partially visualized and patent. Left PICA not evaluated. Basilar artery patent to its distal aspect without stenosis. Superior cerebral arteries patent bilaterally. Left PCA supplied via the basilar. Hypoplastic right P1 with prominent right posterior communicating artery. There are severe tandem bilateral P2 stenoses (series 102, image 7) PCAs otherwise patent to their distal aspects. Distal small vessel atheromatous irregularity. No aneurysm or vascular malformation. IMPRESSION: MRI HEAD IMPRESSION: 1. Patchy small volume acute to subacute ischemic infarcts involving the white matter of the right centrum semi ovale and posterior left parietal region as above. No associated hemorrhage or mass effect. 2. Advanced chronic microvascular ischemic disease for age with multiple remote lacunar infarcts involving the bilateral basal ganglia/corona radiata, thalami, and pons. MRA HEAD IMPRESSION: 1. Negative intracranial MRA for large vessel occlusion. No high-grade proximal or correctable stenosis identified. 2. Tandem severe left P2 stenoses. 3. Mild-to-moderate distal small vessel atheromatous irregularity. Electronically Signed   By: Jeannine Boga M.D.   On: 04/10/2017 01:43   Ct Head Code Stroke Wo Contrast  Result Date: 04/09/2017 CLINICAL DATA:  Code stroke. Subacute neuro deficit. Slurred speech EXAM: CT HEAD WITHOUT CONTRAST TECHNIQUE: Contiguous axial images were obtained from the base of the skull through the vertex without intravenous contrast. COMPARISON:  CT head 03/18/2017  FINDINGS: Brain: Negative for acute infarct.  Negative for hemorrhage or mass. Extensive chronic white matter changes are stable from the prior study. Chronic infarct right internal capsule stable. Chronic infarct left thalamus stable. Vascular: Atherosclerotic calcification. Negative for acute vascular thrombosis Skull: Negative Sinuses/Orbits: Negative Other: None ASPECTS ([place] Stroke Program Early CT Score) - Ganglionic level infarction (caudate, lentiform nuclei, internal capsule, insula, M1-M3 cortex): 7 - Supraganglionic infarction (M4-M6 cortex): 3 Total score (0-10 with 10 being normal): 10 IMPRESSION: 1. No acute abnormality 2. Extensive chronic microvascular ischemia, stable from the recent CT 3. ASPECTS is 10 4. These results were called by telephone at the time of interpretation on 04/09/2017 at 1:04 pm to Dr. Lisa Roca , who verbally acknowledged these results. Electronically Signed   By: Franchot Gallo M.D.   On: 04/09/2017 13:05    Assessment: 47 y.o. female presenting with an episode of dizziness, slurred speech and left/right extremity numbness.  Patient outside window for tPA.  Had a similar presentation a few weeks ago.  Lab work improved since that time.  Has been having issues with hypertension.  It appears her antihypertensives have been reinitiated recently.  BP well controlled.  Unclear etiology of fall and current presentation.  On ASA daily and Wellbutrin.      Pt is back to baseline.  Recent hx of admission to hospital.  MRI reviewed and believe watershed subacute infarcts in setting of orthostasis.  R one right in MCA watershed and L one in the PCA/MCA territory  Pt is still orthostatic today From neuro perspective can d/c but will stay one more day for hydration  Asked her to drink Gatorade/electrolytes.

## 2017-04-10 NOTE — Plan of Care (Signed)
  Progressing Education: Knowledge of General Education information will improve 04/10/2017 0446 - Progressing by Sonda Primes, RN Health Behavior/Discharge Planning: Ability to manage health-related needs will improve 04/10/2017 0446 - Progressing by Sonda Primes, RN Clinical Measurements: Ability to maintain clinical measurements within normal limits will improve 04/10/2017 0446 - Progressing by Sonda Primes, RN Will remain free from infection 04/10/2017 0446 - Progressing by Sonda Primes, RN Diagnostic test results will improve 04/10/2017 0446 - Progressing by Sonda Primes, RN Respiratory complications will improve 04/10/2017 0446 - Progressing by Sonda Primes, RN Cardiovascular complication will be avoided 04/10/2017 0446 - Progressing by Sonda Primes, RN Activity: Risk for activity intolerance will decrease 04/10/2017 0446 - Progressing by Sonda Primes, RN Nutrition: Adequate nutrition will be maintained 04/10/2017 0446 - Progressing by Sonda Primes, RN Coping: Level of anxiety will decrease 04/10/2017 0446 - Progressing by Sonda Primes, RN Elimination: Will not experience complications related to bowel motility 04/10/2017 0446 - Progressing by Sonda Primes, RN Will not experience complications related to urinary retention 04/10/2017 0446 - Progressing by Sonda Primes, RN Pain Managment: General experience of comfort will improve 04/10/2017 0446 - Progressing by Sonda Primes, RN Safety: Ability to remain free from injury will improve 04/10/2017 0446 - Progressing by Sonda Primes, RN Skin Integrity: Risk for impaired skin integrity will decrease 04/10/2017 0446 - Progressing by Sonda Primes, RN

## 2017-04-10 NOTE — Consult Note (Signed)
Brief Orthopaedic Note:  Patient is almost 4 weeks s/p limited arthroscopic debridement,subacromial decompression, mini-open rotator cuff repair (with [place] patch), and biceps tenodesis of the right shoulder by Dr. [name]. She had started outpatient PT for passive ROM.  The patient apparently "passed out" while getting out of bed and fell. She is uncertain about how she fell, but was concerned that she may have fallen on her right shoulder. She was wearing her sling and abduction pillow when she fell. She was admitted for a presumed CVA.  The patient was interviewed and examined. Radiographs of the right shoulder were reviewed. No apparent injury to the right shoulder.  OK to begin PT for stroke rehabilitation. Please continue with the sling and abduction pillow. Gentle passive range of motion of the right shoulder is allowed.  Full note to follow.  James P. Holley Bouche M.D.

## 2017-04-10 NOTE — Progress Notes (Signed)
Patient refuses bed alarms at this time, patient educated abut safety.

## 2017-04-10 NOTE — Consult Note (Signed)
ORTHOPAEDIC CONSULTATION  PATIENT NAME: [name] DOB: 1970-05-17  MRN: [id number]  REQUESTING PHYSICIAN: Nicholes Mango, MD  Chief Complaint: Right shoulder pain  HPI: [name] is a 47 y.o. female who complains of right shoulder pain. The patient apparently "passed out" while getting out of bed and fell. She is uncertain about how she fell, but was concerned that she may have fallen on her right shoulder. She was wearing her sling and abduction pillow when she fell. She was admitted for a presumed CVA.  She is almost 4 weeks s/p limited arthroscopic debridement,subacromial decompression, mini-open rotator cuff repair (with [place] patch), and biceps tenodesis of the right shoulder by Dr. [name]. She had started outpatient PT for passive ROM.  Past Medical History:  Diagnosis Date  . Anemia    iron deficiency anemia  . Aortic arch aneurysm ([place])   . Bipolar disorder ([place])   . BRCA negative 2014  . Colon polyp   . Family history of breast cancer    BRCA neg 2014  . Gastric ulcer 04/27/2011  . Headache    Migraines  . Malignant melanoma of skin of scalp ([place])   . MI, acute, non ST segment elevation ([place])   . Morbid obesity ([place])    a. s/p gastric bypass.  . Myocardial infarction ([place]) 06/03/2015  . Neuromuscular disorder ([place])   . S/P drug eluting coronary stent placement 06/04/2015   Past Surgical History:  Procedure Laterality Date  . APPENDECTOMY    . CARDIAC CATHETERIZATION N/A 11/09/2014   Procedure: Coronary Angiography;  Surgeon: Minna Merritts, MD;  Location: [place] [facility];  Service: Cardiovascular;  Laterality: N/A;  . CARDIAC CATHETERIZATION N/A 11/12/2014   Procedure: Coronary Stent Intervention;  Surgeon: Isaias Cowman, MD;  Location: [place] [facility];  Service: Cardiovascular;  Laterality: N/A;  . CARDIAC CATHETERIZATION N/A 04/18/2015   Procedure: Left Heart Cath and Coronary Angiography;  Surgeon: Minna Merritts,  MD;  Location: [place] [facility];  Service: Cardiovascular;  Laterality: N/A;  . CARDIAC CATHETERIZATION Left 06/04/2015   Procedure: Left Heart Cath and Coronary Angiography;  Surgeon: Wellington Hampshire, MD;  Location: [place] [facility];  Service: Cardiovascular;  Laterality: Left;  . CARDIAC CATHETERIZATION N/A 06/04/2015   Procedure: Coronary Stent Intervention;  Surgeon: Wellington Hampshire, MD;  Location: [place] [facility];  Service: Cardiovascular;  Laterality: N/A;  . CESAREAN SECTION  2001  . CHOLECYSTECTOMY N/A 11/18/2016   Procedure: LAPAROSCOPIC CHOLECYSTECTOMY WITH INTRAOPERATIVE CHOLANGIOGRAM;  Surgeon: Christene Lye, MD;  Location: [facility];  Service: General;  Laterality: N/A;  . COLONOSCOPY WITH PROPOFOL N/A 04/27/2016   Procedure: COLONOSCOPY WITH PROPOFOL;  Surgeon: [name], MD;  Location: [place];  Service: Endoscopy;  Laterality: N/A;  . CORONARY ANGIOPLASTY    . DILATION AND CURETTAGE OF UTERUS    . ESOPHAGOGASTRODUODENOSCOPY (EGD) WITH PROPOFOL N/A 09/14/2014   Procedure: ESOPHAGOGASTRODUODENOSCOPY (EGD) WITH PROPOFOL;  Surgeon: Josefine Class, MD;  Location: [facility];  Service: Endoscopy;  Laterality: N/A;  . ESOPHAGOGASTRODUODENOSCOPY (EGD) WITH PROPOFOL N/A 04/27/2016   Procedure: ESOPHAGOGASTRODUODENOSCOPY (EGD) WITH PROPOFOL;  Surgeon: [name], MD;  Location: [place];  Service: Endoscopy;  Laterality: N/A;  Diabetic - oral meds  . GASTRIC BYPASS  09/2009   [facility]   . Left Carotid to sublcavian artery bypass w/ subclavian artery ligation     a. Performed @ [place].  . MELANOMA EXCISION  2016   Dr. Evorn Gong  . [place]  2002  . RIGHT OOPHORECTOMY    . SHOULDER ARTHROSCOPY WITH OPEN ROTATOR CUFF REPAIR Right 01/07/2016   Procedure: SHOULDER ARTHROSCOPY WITH DEBRIDMENT, SUBACHROMIAL DECOMPRESSION;  Surgeon: Corky Mull, MD;  Location: [facility];  Service: Orthopedics;  Laterality: Right;  .  SHOULDER ARTHROSCOPY WITH OPEN ROTATOR CUFF REPAIR Right 03/16/2017   Procedure: SHOULDER ARTHROSCOPY WITH OPEN ROTATOR CUFF REPAIR POSSIBLE BICEPS TENODESIS;  Surgeon: Corky Mull, MD;  Location: [facility];  Service: Orthopedics;  Laterality: Right;  . TRIGGER FINGER RELEASE Right     [place]   Social History   Socioeconomic History  . Marital status: Divorced    Spouse name: None  . Number of children: 1  . Years of education: None  . Highest education level: None  Social Needs  . Financial resource strain: None  . Food insecurity - worry: None  . Food insecurity - inability: None  . Transportation needs - medical: None  . Transportation needs - non-medical: None  Occupational History  . 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 Disabled through 08/02/2013 per forms 02/03/2013  Tobacco Use  . Smoking status: Former Smoker    Types: Cigarettes    Last attempt to quit: 08/31/1994    Years since quitting: 22.6  . Smokeless tobacco: Never Used  . Tobacco comment: quit 28 years ago  Substance and Sexual Activity  . Alcohol use: No    Alcohol/week: 0.0 oz  . Drug use: No  . Sexual activity: Not Currently    Birth control/protection: None  Other Topics Concern  . None  Social History Narrative   Previously did custolial work. 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   Family History  Problem Relation Age of Onset  . Hypertension Mother   . Anxiety disorder Mother   . Depression Mother   . Bipolar disorder Mother   . Heart disease Mother   . Hyperlipidemia Mother   . Kidney disease Father   . Heart disease Father   . Hypertension Father   . Diabetes Father   . Stroke Father   . Colon cancer Father        dx in his 9's  . Anxiety disorder Father   . Depression Father   . Skin cancer Father   . Kidney disease Sister   . Thyroid nodules Sister   .  Hypertension Sister   . Hypertension Sister   . Diabetes Sister   . Hyperlipidemia Sister   . Depression Sister   . Breast cancer Maternal Aunt 67  . Breast cancer Maternal Aunt 1  . Ovarian cancer Cousin   . Colon cancer Cousin   . Breast cancer Other   . Kidney cancer Neg Hx   . Bladder Cancer Neg Hx    Allergies  Allergen Reactions  . Tramadol     Mouth feels like it's on fire   Prior to Admission medications   Medication Sig Start Date End Date Taking? Authorizing Provider  acetaminophen (TYLENOL) 325 MG tablet Take 2 tablets (650 mg total) by mouth every 6 (six) hours as needed for mild pain (or Fever >/= 101). 03/22/17  Yes Wieting, Richard, MD  ALPRAZolam Duanne Moron) 0.5 MG tablet Take 0.5-1 tablets (0.25-0.5 mg total) by mouth every 6 (six) hours as needed for anxiety. 03/22/17  Yes Wendell,  Richard, MD  aspirin EC 81 MG tablet Take 1 tablet (81 mg total) by mouth daily. 02/19/15  Yes Hildred Priest, MD  Blood Glucose Monitoring Suppl (ONE TOUCH ULTRA 2) w/Device KIT Use to check blood sugar once a day. Dx. E11.9 03/18/17  Yes [name], MD  buPROPion (WELLBUTRIN XL) 150 MG 24 hr tablet Take 1 tablet (150 mg total) by mouth daily. 03/18/17  Yes [name], MD  citalopram (CELEXA) 40 MG tablet Take 1 tablet (40 mg total) by mouth daily. 01/13/17  Yes [name], MD  diltiazem (CARDIZEM CD) 120 MG 24 hr capsule Take 1 capsule (120 mg total) by mouth daily. 01/31/16  Yes Minna Merritts, MD  ezetimibe (ZETIA) 10 MG tablet Take 1 tablet (10 mg total) by mouth at bedtime. 03/22/17  Yes Wieting, Richard, MD  gabapentin (NEURONTIN) 300 MG capsule Take 1 capsule (300 mg total) by mouth 2 (two) times daily. 03/22/17 03/22/18 Yes Wieting, Richard, MD  hyoscyamine (LEVSIN SL) 0.125 MG SL tablet Place 1 tablet (0.125 mg total) under the tongue every 6 (six) hours as needed. Patient taking differently: Place 0.125 mg under the tongue every 6 (six) hours as needed for  cramping.  02/01/17  Yes [name], MD  levothyroxine (SYNTHROID, LEVOTHROID) 25 MCG tablet Take 1 tablet (25 mcg total) daily before breakfast by mouth. 12/18/16  Yes [name], MD  losartan (COZAAR) 100 MG tablet Take 100 mg by mouth daily.   Yes [provider]  metoprolol succinate (TOPROL-XL) 25 MG 24 hr tablet Take 1 tablet (25 mg total) by mouth daily. 03/16/17  Yes Poggi, Marshall Cork, MD  nitroGLYCERIN (NITROSTAT) 0.4 MG SL tablet Place 1 tablet (0.4 mg total) under the tongue every 5 (five) minutes as needed for chest pain. 04/30/15  Yes Gollan, Kathlene November, MD  oxyCODONE (OXY IR/ROXICODONE) 5 MG immediate release tablet Take 0.5 tablets (2.5 mg total) by mouth every 6 (six) hours as needed for moderate pain or severe pain. 03/22/17  Yes Wieting, Richard, MD  pantoprazole (PROTONIX) 40 MG tablet Take 1 tablet (40 mg total) by mouth 2 (two) times daily. 05/11/16  Yes [name], MD   Dg Shoulder Right  Result Date: 04/10/2017 CLINICAL DATA:  Right shoulder pain.  Fell yesterday. EXAM: RIGHT SHOULDER - 2+ VIEW COMPARISON:  Right shoulder MRI 03/03/2017 FINDINGS: There is no evidence of acute fracture or dislocation. Mild AC joint spurring is noted. The visualized portion of the right lung is clear. IMPRESSION: No acute osseous abnormality. Electronically Signed   By: Logan Bores M.D.   On: 04/10/2017 11:23   Mr Brain Wo Contrast  Result Date: 04/10/2017 CLINICAL DATA:  Initial evaluation for acute syncope, right upper and lower with left lower extremity heaviness, tingling. EXAM: MRI HEAD WITHOUT CONTRAST MRA HEAD WITHOUT CONTRAST TECHNIQUE: Multiplanar, multiecho pulse sequences of the brain and surrounding structures were obtained without intravenous contrast. Angiographic images of the head were obtained using MRA technique without contrast. COMPARISON:  Prior CT from 04/09/2017 FINDINGS: MRI HEAD FINDINGS Brain: Generalized age-related cerebral atrophy. Patchy and confluent  T2/FLAIR hyperintensity within the periventricular and deep white matter both cerebral hemispheres, most consistent with chronic small vessel ischemic disease. Multiple scatter remote lacunar infarcts present within the bilateral basal ganglia/corona radiata, as well as the bilateral thalami. Multiple remote lacunar infarcts present within the pons. There is small focus of patchy diffusion abnormality within the subcortical white matter of the anterior right centrum  semi ovale measuring 14 mm (series 100, image 35), consistent with an acute to subacute ischemic infarct. Additional mild diffusion abnormality with associated T2/FLAIR signal within the subcortical and deep white matter of the posterior left parietal region also consistent with ischemia, likely subacute in nature (series 100, image 33). No associated hemorrhage or mass effect. No other evidence for acute or subacute ischemia. Gray-white matter differentiation otherwise maintained. No evidence for acute intracranial hemorrhage. Single punctate chronic microhemorrhage noted within the posterior right centrum semi ovale. No other evidence for chronic hemorrhage. No mass lesion, midline shift or mass effect. No hydrocephalus. No extra-axial fluid collection. Major dural sinuses are grossly patent. Pituitary gland suprasellar region normal. Midline structures intact. Vascular: Major intracranial vascular flow voids maintained. Skull and upper cervical spine: Craniocervical junction normal. Bone marrow signal intensity diffusely decreased on T1 weighted imaging, most commonly related to anemia, smoking, or obesity. No scalp soft tissue abnormality. Visualized upper cervical spine unremarkable. Sinuses/Orbits: Globes and orbital soft tissues within normal limits. Scattered mucosal thickening throughout the paranasal sinuses. No mastoid effusion. Inner ear structures normal. Other: None. MRA HEAD FINDINGS ANTERIOR CIRCULATION: Distal cervical segments of the  internal carotid arteries are patent with antegrade flow. Petrous segments widely patent bilaterally. Atheromatous irregularity within the cavernous/supraclinoid left ICA with mild multifocal narrowing. Cavernous and supraclinoid right ICA widely patent. Widely patent right A1 segment. Left A1 hypoplastic and/or absent, accounting for the diminutive left ICA is compared to the right. Normal anterior communicating artery. Anterior cerebral arteries perfused to their distal aspects without flow-limiting stenosis. Patent M1 segments without stenosis. Normal MCA bifurcations. No proximal M2 occlusion. Distal MCA branches well perfused and symmetric. Distal small vessel atheromatous irregularity. POSTERIOR CIRCULATION: Vertebral arteries not well evaluated on this exam. Suggestive dominant right vertebral artery with hypoplastic left vertebral artery. Right PICA partially visualized and patent. Left PICA not evaluated. Basilar artery patent to its distal aspect without stenosis. Superior cerebral arteries patent bilaterally. Left PCA supplied via the basilar. Hypoplastic right P1 with prominent right posterior communicating artery. There are severe tandem bilateral P2 stenoses (series 102, image 7) PCAs otherwise patent to their distal aspects. Distal small vessel atheromatous irregularity. No aneurysm or vascular malformation. IMPRESSION: MRI HEAD IMPRESSION: 1. Patchy small volume acute to subacute ischemic infarcts involving the white matter of the right centrum semi ovale and posterior left parietal region as above. No associated hemorrhage or mass effect. 2. Advanced chronic microvascular ischemic disease for age with multiple remote lacunar infarcts involving the bilateral basal ganglia/corona radiata, thalami, and pons. MRA HEAD IMPRESSION: 1. Negative intracranial MRA for large vessel occlusion. No high-grade proximal or correctable stenosis identified. 2. Tandem severe left P2 stenoses. 3. Mild-to-moderate  distal small vessel atheromatous irregularity. Electronically Signed   By: Jeannine Boga M.D.   On: 04/10/2017 01:43   US Carotid Bilateral (at [facility] And [facility] Only)  Result Date: 04/09/2017 CLINICAL DATA:  47 year old female with a history of syncope. Cardiovascular risk factors include hypertension, known prior stroke/TIA, hyperlipidemia, diabetes EXAM: BILATERAL CAROTID DUPLEX ULTRASOUND TECHNIQUE: Pearline Cables scale imaging, color Doppler and duplex ultrasound were performed of bilateral carotid and vertebral arteries in the neck. COMPARISON:  No prior duplex FINDINGS: Criteria: Quantification of carotid stenosis is based on velocity parameters that correlate the residual internal carotid diameter with NASCET-based stenosis levels, using the diameter of the distal internal carotid lumen as the denominator for stenosis measurement. The following velocity measurements were obtained: RIGHT ICA:  Systolic 79 cm/sec, Diastolic 29 cm/sec CCA:  70 cm/sec SYSTOLIC ICA/CCA RATIO:  1.1 ECA:  50 cm/sec LEFT ICA:  Systolic 75 cm/sec, Diastolic 27 cm/sec CCA:  417 cm/sec SYSTOLIC ICA/CCA RATIO:  0.5 ECA:  87 cm/sec Right Brachial SBP: Not acquired Left Brachial SBP: Not acquired RIGHT CAROTID ARTERY: No significant calcifications of the right common carotid artery. Intermediate waveform maintained. Heterogeneous and partially calcified plaque at the right carotid bifurcation. No significant lumen shadowing. Low resistance waveform of the right ICA. No significant tortuosity. RIGHT VERTEBRAL ARTERY: Antegrade flow with low resistance waveform. LEFT CAROTID ARTERY: No significant calcifications of the left common carotid artery. Intermediate waveform maintained. Heterogeneous and partially calcified plaque at the left carotid bifurcation without significant lumen shadowing. Low resistance waveform of the left ICA. No significant tortuosity. LEFT VERTEBRAL ARTERY:  Antegrade flow with low resistance waveform. IMPRESSION: Color  duplex indicates minimal heterogeneous and calcified plaque, with no hemodynamically significant stenosis by duplex criteria in the extracranial cerebrovascular circulation. Signed, Dulcy Fanny. Earleen Newport, DO [facility] Electronically Signed   By: Corrie Mckusick D.O.   On: 04/09/2017 16:32   Mr Jodene Nam Head/brain EY Cm  Result Date: 04/10/2017 CLINICAL DATA:  Initial evaluation for acute syncope, right upper and lower with left lower extremity heaviness, tingling. EXAM: MRI HEAD WITHOUT CONTRAST MRA HEAD WITHOUT CONTRAST TECHNIQUE: Multiplanar, multiecho pulse sequences of the brain and surrounding structures were obtained without intravenous contrast. Angiographic images of the head were obtained using MRA technique without contrast. COMPARISON:  Prior CT from 04/09/2017 FINDINGS: MRI HEAD FINDINGS Brain: Generalized age-related cerebral atrophy. Patchy and confluent T2/FLAIR hyperintensity within the periventricular and deep white matter both cerebral hemispheres, most consistent with chronic small vessel ischemic disease. Multiple scatter remote lacunar infarcts present within the bilateral basal ganglia/corona radiata, as well as the bilateral thalami. Multiple remote lacunar infarcts present within the pons. There is small focus of patchy diffusion abnormality within the subcortical white matter of the anterior right centrum semi ovale measuring 14 mm (series 100, image 35), consistent with an acute to subacute ischemic infarct. Additional mild diffusion abnormality with associated T2/FLAIR signal within the subcortical and deep white matter of the posterior left parietal region also consistent with ischemia, likely subacute in nature (series 100, image 33). No associated hemorrhage or mass effect. No other evidence for acute or subacute ischemia. Gray-white matter differentiation otherwise maintained. No evidence for acute intracranial hemorrhage. Single  punctate chronic microhemorrhage noted within the posterior right centrum semi ovale. No other evidence for chronic hemorrhage. No mass lesion, midline shift or mass effect. No hydrocephalus. No extra-axial fluid collection. Major dural sinuses are grossly patent. Pituitary gland suprasellar region normal. Midline structures intact. Vascular: Major intracranial vascular flow voids maintained. Skull and upper cervical spine: Craniocervical junction normal. Bone marrow signal intensity diffusely decreased on T1 weighted imaging, most commonly related to anemia, smoking, or obesity. No scalp soft tissue abnormality. Visualized upper cervical spine unremarkable. Sinuses/Orbits: Globes and orbital soft tissues within normal limits. Scattered mucosal thickening throughout the paranasal sinuses. No mastoid effusion. Inner ear structures normal. Other: None. MRA HEAD FINDINGS ANTERIOR CIRCULATION: Distal cervical segments of the internal carotid arteries are patent with antegrade flow. Petrous segments widely patent bilaterally. Atheromatous irregularity within the cavernous/supraclinoid left ICA with mild multifocal narrowing. Cavernous and supraclinoid right ICA widely patent. Widely patent right A1 segment. Left A1 hypoplastic and/or absent, accounting for the diminutive left ICA is compared to the right. Normal anterior communicating artery. Anterior cerebral arteries perfused to their distal  aspects without flow-limiting stenosis. Patent M1 segments without stenosis. Normal MCA bifurcations. No proximal M2 occlusion. Distal MCA branches well perfused and symmetric. Distal small vessel atheromatous irregularity. POSTERIOR CIRCULATION: Vertebral arteries not well evaluated on this exam. Suggestive dominant right vertebral artery with hypoplastic left vertebral artery. Right PICA partially visualized and patent. Left PICA not evaluated. Basilar artery patent to its distal aspect without stenosis. Superior cerebral  arteries patent bilaterally. Left PCA supplied via the basilar. Hypoplastic right P1 with prominent right posterior communicating artery. There are severe tandem bilateral P2 stenoses (series 102, image 7) PCAs otherwise patent to their distal aspects. Distal small vessel atheromatous irregularity. No aneurysm or vascular malformation. IMPRESSION: MRI HEAD IMPRESSION: 1. Patchy small volume acute to subacute ischemic infarcts involving the white matter of the right centrum semi ovale and posterior left parietal region as above. No associated hemorrhage or mass effect. 2. Advanced chronic microvascular ischemic disease for age with multiple remote lacunar infarcts involving the bilateral basal ganglia/corona radiata, thalami, and pons. MRA HEAD IMPRESSION: 1. Negative intracranial MRA for large vessel occlusion. No high-grade proximal or correctable stenosis identified. 2. Tandem severe left P2 stenoses. 3. Mild-to-moderate distal small vessel atheromatous irregularity. Electronically Signed   By: Jeannine Boga M.D.   On: 04/10/2017 01:43   Ct Head Code Stroke Wo Contrast  Result Date: 04/09/2017 CLINICAL DATA:  Code stroke. Subacute neuro deficit. Slurred speech EXAM: CT HEAD WITHOUT CONTRAST TECHNIQUE: Contiguous axial images were obtained from the base of the skull through the vertex without intravenous contrast. COMPARISON:  CT head 03/18/2017 FINDINGS: Brain: Negative for acute infarct.  Negative for hemorrhage or mass. Extensive chronic white matter changes are stable from the prior study. Chronic infarct right internal capsule stable. Chronic infarct left thalamus stable. Vascular: Atherosclerotic calcification. Negative for acute vascular thrombosis Skull: Negative Sinuses/Orbits: Negative Other: None ASPECTS ([place] Stroke Program Early CT Score) - Ganglionic level infarction (caudate, lentiform nuclei, internal capsule, insula, M1-M3 cortex): 7 - Supraganglionic infarction (M4-M6 cortex): 3 Total  score (0-10 with 10 being normal): 10 IMPRESSION: 1. No acute abnormality 2. Extensive chronic microvascular ischemia, stable from the recent CT 3. ASPECTS is 10 4. These results were called by telephone at the time of interpretation on 04/09/2017 at 1:04 pm to Dr. Lisa Roca , who verbally acknowledged these results. Electronically Signed   By: Franchot Gallo M.D.   On: 04/09/2017 13:05    Positive ROS: All other systems have been reviewed and were otherwise negative with the exception of those mentioned in the HPI and as above.  Physical Exam: General: Well developed, well nourished female seen in no acute distress. HEENT: Atraumatic and normocephalic. Sclera are clear. Extraocular motion is intact. Oropharynx is clear with moist mucosa. Neck: Supple, nontender, good range of motion. Lungs: Clear to auscultation bilaterally. Cardiovascular: Regular rate and rhythm with normal S1 and S2. No murmurs. No gallops or rubs. Homans test is negative bilaterally. No significant pretibial or ankle edema. Abdomen: Soft, nontender, and nondistended. Skin: No lesions in the area of chief complaint Neurologic: Awake, alert, and oriented. Sensory function is grossly intact. Motor strength is felt to be 5 over 5 bilaterally. No clonus or tremor. Good motor coordination. Lymphatic: No axillary or cervical lymphadenopathy  MUSCULOSKELETAL: Examination of the right upper extremity was performed.  The previous surgical incisions to the right shoulder are well approximated.  No erythema or ecchymosis.  No significant swelling.  A sling and abduction pillow is in place.  Good grip strength.  Good range of motion of the wrist and elbow.  There is guarding with attempted passive range of motion of the right shoulder.  Active range of motion of the shoulder and shoulder girdle strength were not assessed.  Assessment: Right shoulder pain Status post right rotator cuff repair  Plan: The findings were reviewed with the  patient.  I do not appreciate any overt evidence of damage to the right shoulder.  She should continue with the sling and abduction pillow.  She is cleared for physical therapy as per the stroke rehabilitation, but she should refrain from any active range of motion or strengthening until she is reevaluated by Dr. [name].  James P. Holley Bouche M.D.

## 2017-04-10 NOTE — Evaluation (Signed)
Physical Therapy Evaluation Patient Details Name: [name] MRN: [id number] DOB: 1970/08/02 Today's Date: 04/10/2017   History of Present Illness  47 y.o. female here with weakness, dizziness, and syncope. Pt. had a recent Right RTC, and Biceps Tendon repair (03/16/17).  MRI revealed subacute CVA, pt is feeling close to her baseline at time of PT exam.  Clinical Impression  Pt did well with PT exam and showed good confidence and safety with mobility despite R UE in immobilizer and having some pain.  She reports she has been doing well at home with son's assist and doing outpatient PT for RTC repair rehab.  Pt does not have any further acute care needs and should be safe to return home when medically cleared.  Will complete PT orders at this time, will need to continue outpt PT on d/c.     Follow Up Recommendations Outpatient PT    Equipment Recommendations       Recommendations for Other Services       Precautions / Restrictions Precautions Precautions: Fall Restrictions Weight Bearing Restrictions: Yes RUE Weight Bearing: Non weight bearing Other Position/Activity Restrictions: RUE brace in place.      Mobility  Bed Mobility Overal bed mobility: Modified Independent             General bed mobility comments: Pt was able to rise to EOB with use of rails and some extra time, did not need direct assist  Transfers Overall transfer level: Modified independent   Transfers: Sit to/from Stand Sit to Stand: Supervision         General transfer comment: Pt able to safely rise at EOB w/o assist, no LOBs or safety issues  Ambulation/Gait Ambulation/Gait assistance: Modified independent (Device/Increase time) Ambulation Distance (Feet): 250 Feet Assistive device: None       General Gait Details: Pt was able to confidently walk into and down the hallway.  No AD, no LOBs, no fatigue.   Stairs Stairs: Yes Stairs assistance: Supervision Stair Management: One rail  Left Number of Stairs: 6 General stair comments: Pt confident with stair negotiation  Wheelchair Mobility    Modified Rankin (Stroke Patients Only)       Balance Overall balance assessment: Independent                                           Pertinent Vitals/Pain Pain Assessment: 0-10 Pain Score: 6  Pain Location: R shoulder Pain Descriptors / Indicators: Aching Pain Intervention(s): Monitored during session;Limited activity within patient's tolerance    Home Living Family/patient expects to be discharged to:: Private residence Living Arrangements: Children Available Help at Discharge: Family;Available PRN/intermittently Type of Home: House Home Access: Stairs to enter Entrance Stairs-Rails: Left Entrance Stairs-Number of Steps: 3 Home Layout: One level Home Equipment: None      Prior Function Level of Independence: (pt independent with ADLs, mobility,  etc prior to RTC sx)      ADL's / Homemaking Assistance Needed: Son assists pt. with ADLs.        Hand Dominance   Dominant Hand: Right    Extremity/Trunk Assessment   Upper Extremity Assessment Upper Extremity Assessment: Generalized weakness;Overall WFL for tasks assessed RUE: Unable to fully assess due to immobilization    Lower Extremity Assessment Lower Extremity Assessment: Overall WFL for tasks assessed       Communication   Communication: No difficulties  Cognition Arousal/Alertness: Awake/alert Behavior During Therapy: WFL for tasks assessed/performed Overall Cognitive Status: Within Functional Limits for tasks assessed                                        General Comments      Exercises     Assessment/Plan    PT Assessment All further PT needs can be met in the next venue of care  PT Problem List Decreased strength;Decreased activity tolerance;Decreased balance;Decreased mobility;Pain       PT Treatment Interventions Gait training;DME  instruction;Stair training;Therapeutic exercise    PT Goals (Current goals can be found in the Care Plan section)  Acute Rehab PT Goals Patient Stated Goal: To retun home PT Goal Formulation: All assessment and education complete, DC therapy    Frequency Min 2X/week   Barriers to discharge        Co-evaluation               AM-PAC PT "6 Clicks" Daily Activity  Outcome Measure Difficulty turning over in bed (including adjusting bedclothes, sheets and blankets)?: None Difficulty moving from lying on back to sitting on the side of the bed? : A Little Difficulty sitting down on and standing up from a chair with arms (e.g., wheelchair, bedside commode, etc,.)?: None Help needed moving to and from a bed to chair (including a wheelchair)?: None Help needed walking in hospital room?: None Help needed climbing 3-5 steps with a railing? : None 6 Click Score: 23    End of Session Equipment Utilized During Treatment: Gait belt Activity Tolerance: Patient tolerated treatment well Patient left: in bed;with call bell/phone within reach Nurse Communication: Mobility status PT Visit Diagnosis: Muscle weakness (generalized) (M62.81);Difficulty in walking, not elsewhere classified (R26.2);Pain;Unsteadiness on feet (R26.81) Pain - Right/Left: Right Pain - part of body: Shoulder    Time: 1884-1660 PT Time Calculation (min) (ACUTE ONLY): 16 min   Charges:   PT Evaluation $PT Eval Low Complexity: 1 Low     PT G Codes:        Kreg Shropshire, DPT 04/10/2017, 3:50 PM

## 2017-04-10 NOTE — Clinical Social Work Note (Signed)
CSW received consult for home health needs. Please consult care management. CSW signing off. Please consult should CSW needs arise.  Santiago Bumpers, MSW, Latanya Presser [phone]

## 2017-04-10 NOTE — Plan of Care (Signed)
  Clinical Measurements: Ability to maintain clinical measurements within normal limits will improve 04/10/2017 1454 - Not Progressing by Oris Drone, RN  Pt orthostatic this am/see flowsheets in [facility].  Pt started on IVF NS this shift and will have ortho VS performed on 04/11/17 at 0500 as ordered in [facility] by Dr Margaretmary Eddy.

## 2017-04-10 NOTE — Progress Notes (Addendum)
Hypoglycemic Event  CBG: 46  Treatment: juice  Symptoms: none  Follow-up CBG: Time:0430 CBG Result:67    Sonda Primes

## 2017-04-10 NOTE — Evaluation (Signed)
Occupational Therapy Evaluation Patient Details Name: [name] MRN: [id number] DOB: 12/22/1970 Today's Date: 04/10/2017    History of Present Illness Pt. is a 47 y.o. femlae who was admitted to [facility] with weakness, dizziness, and syncope. Pt. had a recent Right RTC, and Biceps Tendon repair, fell with an episode of confusion. Her son brought her to the hospital for further evaluation.   Clinical Impression   Pt. Presents with weakness, dominant RUE immobilization, limited activity tolerance, and limited functional mobility which limits the ability to complete basic ADL and IADL functioning. Pt. Resides at home with her son. Pt.'s son assists with ADLs as needed, and IADL tasks. Pt. Reports being able to drive, and manage own medication, and  Pt. son also assists with meal preparation. Pt. education was provided about one armed dressing techniques, positioning, and ADL techniques. Pt. Could benefit from OT services for ADL training, A/E training, and pt. Education about home modification, and DME. Pt. Plans to return home upon discharge with her son to assist as needed.   Follow Up Recommendations  No OT follow up    Equipment Recommendations       Recommendations for Other Services       Precautions / Restrictions Precautions Precautions: Fall Restrictions Weight Bearing Restrictions: Yes RUE Weight Bearing: Non weight bearing Other Position/Activity Restrictions: RUE brace in place.             ADL either performed or assessed with clinical judgement   ADL Overall ADL's : Needs assistance/impaired Eating/Feeding: Set up;Sitting;Independent(uses left)   Grooming: Sitting;Minimal assistance;Set up   Upper Body Bathing: Set up;Minimal assistance   Lower Body Bathing: Set up;Minimal assistance   Upper Body Dressing : Set up;Minimal assistance   Lower Body Dressing: Minimal assistance               Functional mobility during ADLs: Independent General ADL  Comments: Pt. edcuation was provided about one armed dressing techniques. Pt. reports she learned these techniques following recent surgery, and has been using them.     Vision         Perception     Praxis      Pertinent Vitals/Pain Pain Assessment: 0-10 Pain Score: 7  Pain Location: headache Pain Descriptors / Indicators: Aching Pain Intervention(s): Monitored during session;Limited activity within patient's tolerance     Hand Dominance Right   Extremity/Trunk Assessment Upper Extremity Assessment Upper Extremity Assessment: Generalized weakness;LUE deficits/detail;RUE deficits/detail(LUE strength 4/5 overall.) RUE: Unable to fully assess due to immobilization           Communication Communication Communication: No difficulties   Cognition Arousal/Alertness: Awake/alert Behavior During Therapy: WFL for tasks assessed/performed Overall Cognitive Status: Within Functional Limits for tasks assessed                                     General Comments       Exercises     Shoulder Instructions      Home Living Family/patient expects to be discharged to:: Private residence Living Arrangements: Children Available Help at Discharge: Family;Available PRN/intermittently Type of Home: House Home Access: Stairs to enter [employer] of Steps: 3 Entrance Stairs-Rails: Left Home Layout: One level     Bathroom Shower/Tub: Tub/shower unit;Curtain         Home Equipment: None          Prior Functioning/Environment Level of Independence: Needs assistance  ADL's / Homemaking Assistance Needed: Son assists pt. with ADLs.            OT Problem List: Decreased strength;Decreased range of motion;Impaired UE functional use;Decreased coordination;Decreased knowledge of use of DME or AE      OT Treatment/Interventions: Self-care/ADL training;Therapeutic exercise;Neuromuscular education;Therapeutic activities;Patient/family education     OT Goals(Current goals can be found in the care plan section) Acute Rehab OT Goals Patient Stated Goal: To retun home OT Goal Formulation: With patient Potential to Achieve Goals: Good  OT Frequency: Min 1X/week   Barriers to D/C:            Co-evaluation              AM-PAC PT "6 Clicks" Daily Activity     Outcome Measure Help from another person eating meals?: None Help from another person taking care of personal grooming?: A Little Help from another person toileting, which includes using toliet, bedpan, or urinal?: A Little Help from another person bathing (including washing, rinsing, drying)?: A Little Help from another person to put on and taking off regular upper body clothing?: A Little Help from another person to put on and taking off regular lower body clothing?: A Little 6 Click Score: 19   End of Session    Activity Tolerance: Patient tolerated treatment well Patient left: in bed  OT Visit Diagnosis: Muscle weakness (generalized) (M62.81)                Time: 0932-1000 OT Time Calculation (min): 28 min Charges:  OT General Charges $OT Visit: 1 Visit OT Evaluation $OT Eval Moderate Complexity: 1 Mod G-Codes:     Harrel Carina, MS, OTR/L   Harrel Carina, MS, OTR/L 04/10/2017, 12:39 PM

## 2017-04-10 NOTE — Plan of Care (Signed)
  Ischemic Stroke/TIA Tissue Perfusion: Complications of ischemic stroke/TIA will be minimized 04/10/2017 0447 - Progressing by Sonda Primes, RN   Self-Care: Ability to participate in self-care as condition permits will improve 04/10/2017 0447 - Progressing by Sonda Primes, RN   Education: Knowledge of secondary prevention will improve 04/10/2017 0447 - Progressing by Sonda Primes, RN

## 2017-04-11 DIAGNOSIS — I2510 Atherosclerotic heart disease of native coronary artery without angina pectoris: Secondary | ICD-10-CM | POA: Diagnosis present

## 2017-04-11 DIAGNOSIS — E162 Hypoglycemia, unspecified: Secondary | ICD-10-CM | POA: Diagnosis not present

## 2017-04-11 DIAGNOSIS — E1122 Type 2 diabetes mellitus with diabetic chronic kidney disease: Secondary | ICD-10-CM | POA: Diagnosis present

## 2017-04-11 DIAGNOSIS — N184 Chronic kidney disease, stage 4 (severe): Secondary | ICD-10-CM | POA: Diagnosis present

## 2017-04-11 DIAGNOSIS — Z66 Do not resuscitate: Secondary | ICD-10-CM | POA: Diagnosis present

## 2017-04-11 DIAGNOSIS — Z8249 Family history of ischemic heart disease and other diseases of the circulatory system: Secondary | ICD-10-CM | POA: Diagnosis not present

## 2017-04-11 DIAGNOSIS — I639 Cerebral infarction, unspecified: Secondary | ICD-10-CM | POA: Diagnosis present

## 2017-04-11 DIAGNOSIS — D509 Iron deficiency anemia, unspecified: Secondary | ICD-10-CM | POA: Diagnosis present

## 2017-04-11 DIAGNOSIS — Z7982 Long term (current) use of aspirin: Secondary | ICD-10-CM | POA: Diagnosis not present

## 2017-04-11 DIAGNOSIS — M25511 Pain in right shoulder: Secondary | ICD-10-CM | POA: Diagnosis present

## 2017-04-11 DIAGNOSIS — Z7984 Long term (current) use of oral hypoglycemic drugs: Secondary | ICD-10-CM | POA: Diagnosis not present

## 2017-04-11 DIAGNOSIS — R296 Repeated falls: Secondary | ICD-10-CM

## 2017-04-11 DIAGNOSIS — Z90721 Acquired absence of ovaries, unilateral: Secondary | ICD-10-CM | POA: Diagnosis not present

## 2017-04-11 DIAGNOSIS — R29705 NIHSS score 5: Secondary | ICD-10-CM | POA: Diagnosis present

## 2017-04-11 DIAGNOSIS — Z87891 Personal history of nicotine dependence: Secondary | ICD-10-CM | POA: Diagnosis not present

## 2017-04-11 DIAGNOSIS — I951 Orthostatic hypotension: Secondary | ICD-10-CM | POA: Diagnosis present

## 2017-04-11 DIAGNOSIS — R55 Syncope and collapse: Secondary | ICD-10-CM | POA: Diagnosis not present

## 2017-04-11 DIAGNOSIS — R202 Paresthesia of skin: Secondary | ICD-10-CM | POA: Diagnosis not present

## 2017-04-11 DIAGNOSIS — Z823 Family history of stroke: Secondary | ICD-10-CM | POA: Diagnosis not present

## 2017-04-11 DIAGNOSIS — F319 Bipolar disorder, unspecified: Secondary | ICD-10-CM | POA: Diagnosis present

## 2017-04-11 DIAGNOSIS — E039 Hypothyroidism, unspecified: Secondary | ICD-10-CM | POA: Diagnosis present

## 2017-04-11 DIAGNOSIS — I129 Hypertensive chronic kidney disease with stage 1 through stage 4 chronic kidney disease, or unspecified chronic kidney disease: Secondary | ICD-10-CM | POA: Diagnosis present

## 2017-04-11 DIAGNOSIS — Z955 Presence of coronary angioplasty implant and graft: Secondary | ICD-10-CM | POA: Diagnosis not present

## 2017-04-11 DIAGNOSIS — I252 Old myocardial infarction: Secondary | ICD-10-CM | POA: Diagnosis not present

## 2017-04-11 DIAGNOSIS — Z85820 Personal history of malignant melanoma of skin: Secondary | ICD-10-CM | POA: Diagnosis not present

## 2017-04-11 DIAGNOSIS — Z9884 Bariatric surgery status: Secondary | ICD-10-CM | POA: Diagnosis not present

## 2017-04-11 DIAGNOSIS — Z79890 Hormone replacement therapy (postmenopausal): Secondary | ICD-10-CM | POA: Diagnosis not present

## 2017-04-11 DIAGNOSIS — Z833 Family history of diabetes mellitus: Secondary | ICD-10-CM | POA: Diagnosis not present

## 2017-04-11 LAB — GLUCOSE, CAPILLARY
GLUCOSE-CAPILLARY: 70 mg/dL (ref 65–99)
GLUCOSE-CAPILLARY: 66 mg/dL (ref 65–99)
GLUCOSE-CAPILLARY: 32 mg/dL — AB (ref 65–99)
GLUCOSE-CAPILLARY: 119 mg/dL — AB (ref 65–99)
GLUCOSE-CAPILLARY: 73 mg/dL (ref 65–99)
GLUCOSE-CAPILLARY: 63 mg/dL — AB (ref 65–99)
GLUCOSE-CAPILLARY: 79 mg/dL (ref 65–99)
GLUCOSE-CAPILLARY: 59 mg/dL — AB (ref 65–99)
GLUCOSE-CAPILLARY: 106 mg/dL — AB (ref 65–99)
Glucose-Capillary: 26 mg/dL — CL (ref 65–99)
Glucose-Capillary: 37 mg/dL — CL (ref 65–99)
Glucose-Capillary: 17 mg/dL — CL (ref 65–99)
Glucose-Capillary: 77 mg/dL (ref 65–99)
Glucose-Capillary: 57 mg/dL — ABNORMAL LOW (ref 65–99)
Glucose-Capillary: 63 mg/dL — ABNORMAL LOW (ref 65–99)
Glucose-Capillary: 70 mg/dL (ref 65–99)
Glucose-Capillary: 35 mg/dL — CL (ref 65–99)
Glucose-Capillary: 99 mg/dL (ref 65–99)
Glucose-Capillary: 96 mg/dL (ref 65–99)

## 2017-04-11 LAB — HEMOGLOBIN A1C
Hgb A1c MFr Bld: 5.2 % (ref 4.8–5.6)
MEAN PLASMA GLUCOSE: 103 mg/dL

## 2017-04-11 LAB — POCT CBG MONITORING: POCT Glucose (KUC): 73 mg/dL (ref 70–99)

## 2017-04-11 MED ORDER — DEXTROSE 50 % IV SOLN
INTRAVENOUS | Status: AC
  Filled 2017-04-11: qty 50

## 2017-04-11 MED ORDER — DEXTROSE 50 % IV SOLN
25.0000 g | INTRAVENOUS | Status: AC
  Administered 2017-04-11: 08:00:00 25 g via INTRAVENOUS

## 2017-04-11 MED ORDER — DEXTROSE 5 % IV SOLN
INTRAVENOUS | Status: DC
  Administered 2017-04-11 – 2017-04-12 (×2): via INTRAVENOUS

## 2017-04-11 MED ORDER — ADULT MULTIVITAMIN W/MINERALS CH
1.0000 | ORAL_TABLET | Freq: Every day | ORAL | Status: DC
  Administered 2017-04-12 – 2017-04-13 (×2): 1 via ORAL
  Filled 2017-04-11 (×2): qty 1

## 2017-04-11 MED ORDER — PREMIER PROTEIN SHAKE
11.0000 [oz_av] | Freq: Two times a day (BID) | ORAL | Status: DC
  Administered 2017-04-11 – 2017-04-13 (×4): 11 [oz_av] via ORAL

## 2017-04-11 MED ORDER — LEVOTHYROXINE SODIUM 25 MCG PO TABS
25.0000 ug | ORAL_TABLET | Freq: Every day | ORAL | Status: DC
  Administered 2017-04-11 – 2017-04-13 (×3): 25 ug via ORAL
  Filled 2017-04-11 (×3): qty 1

## 2017-04-11 MED ORDER — DEXTROSE-NACL 5-0.9 % IV SOLN
INTRAVENOUS | Status: DC
  Administered 2017-04-11: 10:00:00 via INTRAVENOUS

## 2017-04-11 NOTE — Progress Notes (Signed)
Inpatient Diabetes Program Recommendations  AACE/ADA: New Consensus Statement on Inpatient Glycemic Control (2015)  Target Ranges:  Prepandial:   less than 140 mg/dL      Peak postprandial:   less than 180 mg/dL (1-2 hours)      Critically ill patients:  140 - 180 mg/dL  Results for [name], [name] (MRN [id number]) as of 04/11/2017 08:40  Ref. Range 04/10/2017 17:16 04/10/2017 21:25 04/10/2017 21:54 04/10/2017 22:17 04/11/2017 03:38 04/11/2017 04:19 04/11/2017 04:49 04/11/2017 07:23 04/11/2017 07:53 04/11/2017 08:20  Glucose-Capillary Latest Ref Range: 65 - 99 mg/dL 90 31 (LL) 54 (L) 115 (H) 26 (LL) 37 (LL) 70 17 (LL) 66 77  Results for [name], [name] (MRN [id number]) as of 04/11/2017 08:40  Ref. Range 04/10/2017 04:32 04/10/2017 08:03 04/10/2017 08:24 04/10/2017 12:29 04/10/2017 16:50  Glucose-Capillary Latest Ref Range: 65 - 99 mg/dL 67 44 (LL) 131 (H) 114 (H) 38 (LL)    Review of Glycemic Control  Diabetes history: DM2 Outpatient Diabetes medications: Glipizide 2.5 mg daily (started on 03/31/17 by PCP) Current orders for Inpatient glycemic control: Novolog 0-9 units TID with meals, Novolog 0-5 units QHS  Inpatient Diabetes Program Recommendations: Inpatient Glycemic Control: Patient's glucose has been consistently trending low. Recommend lab glucose be drawn at time of finger stick to ensure accuracy of finger stick.  Also, may need to order IV fluids with dextrose. Hypothyroidism: Noted patient has hypothyroidism and takes Synthroid outpatient.  NO medications currently ordered for hypothyroidism. Hypothyroidism can result in hypoglycemia. Please consider addressing hypothyroidism treatment while inpatient.  Inpatient Referral: If hypoglycemia persist, recommend attending MD call Endocrinologist to discuss patient to see if they will provide recommendations. Please note that even if Endocrinologist consult placed, there is not an Endocrinologist that is routinely coming into the hospital. In the past, Dr.  Gabriel Carina has agreed to talk with the physician directly regarding specific patient issues. Oral Agents: Would not recommend patient resume Glipizide as an outpatient. Recommend patient be referred to an Endocrinologist to assist with DM control.  NOTE: Patient has DM2 hx and was taking Metformin for outpatient DM control. Metformin was discontinued on 03/22/17 when patient was discharged from the hospital. Per chart review, noted patient's PCP started her on Glipizide 2.5 mg daily on 03/31/17.  Diabetes Coordinator consulted due to recurrent hypoglycemia with notation that patient reports she has been having lows at home since being on Glipizide. Diabetes Coordinator is not on campus over the weekend but is available by pager for questions or concerns. Patient is currently ordered Novolog insulin as in inpatient but she has NOT received any insulin since being admitted. Patient also has a history of hypothyroidism and takes Synthroid as an outpatient. Question if recurrent hypoglycemia noted as an inpatient related to hypothyroidism. Would recommend ordering Synthroid and if patient continues to experience hypoglycemia, attending MD will need to call Endocrinologist directly for their recommendations.    Thanks, Barnie Alderman, RN, MSN, CDE Diabetes Coordinator Inpatient Diabetes Program [phone] (Team Pager from 8am to 5pm)

## 2017-04-11 NOTE — Progress Notes (Signed)
[facility] [place] NAME: [name]    MR#:  [id number]  DATE OF BIRTH:  12-29-1970  SUBJECTIVE:  CHIEF COMPLAINT: Patient is feeling fine.  Accu-Chek dropped down to 17 today morning, patient was confused at the time but during my examination after D50 ampule was given she started feeling better lower extremity weakness and heaviness improved.  Denies any headache.  Patient son at bedside  REVIEW OF SYSTEMS:  CONSTITUTIONAL: No fever, fatigue or weakness.  EYES: No blurred or double vision.  EARS, NOSE, AND THROAT: No tinnitus or ear pain.  RESPIRATORY: No cough, shortness of breath, wheezing or hemoptysis.  CARDIOVASCULAR: No chest pain, orthopnea, edema.  GASTROINTESTINAL: No nausea, vomiting, diarrhea or abdominal pain.  GENITOURINARY: No dysuria, hematuria.  ENDOCRINE: No polyuria, nocturia,  HEMATOLOGY: No anemia, easy bruising or bleeding SKIN: No rash or lesion. MUSCULOSKELETAL: No joint pain or arthritis.   NEUROLOGIC: No tingling, numbness, weakness.  PSYCHIATRY: No anxiety or depression.   DRUG ALLERGIES:   Allergies  Allergen Reactions  . Tramadol     Mouth feels like it's on fire    VITALS:  Blood pressure 126/88, pulse 99, temperature 97.8 F (36.6 C), temperature source Oral, resp. rate 20, height 5\' 2"  (1.575 m), weight 75.4 kg (166 lb 3.2 oz), SpO2 93 %.  PHYSICAL EXAMINATION:  GENERAL:  47 y.o.-year-old patient lying in the bed with no acute distress.  EYES: Pupils equal, round, reactive to light and accommodation. No scleral icterus. Extraocular muscles intact.  HEENT: Head atraumatic, normocephalic. Oropharynx and nasopharynx clear.  NECK:  Supple, no jugular venous distention. No thyroid enlargement, no tenderness.  LUNGS: Normal breath sounds bilaterally, no wheezing, rales,rhonchi or crepitation. No use of accessory muscles of respiration.  CARDIOVASCULAR: S1, S2 normal. No murmurs, rubs, or  gallops.  ABDOMEN: Soft, nontender, nondistended. Bowel sounds present. No organomegaly or mass.  EXTREMITIES: No pedal edema, cyanosis, or clubbing.  NEUROLOGIC: Cranial nerves II through XII are intact. Muscle strength 5/5 in all extremities except right upper extremity. Sensation intact. Gait not checked.  PSYCHIATRIC: The patient is alert and oriented x 3.  SKIN: No obvious rash, lesion, or ulcer.    LABORATORY PANEL:   CBC Recent Labs  Lab 04/09/17 1307  WBC 9.4  HGB 11.0*  HCT 33.8*  PLT 202   ------------------------------------------------------------------------------------------------------------------  Chemistries  Recent Labs  Lab 04/09/17 1307  NA 139  K 4.1  CL 105  CO2 25  GLUCOSE 139*  BUN 20  CREATININE 2.06*  CALCIUM 8.6*  AST 27  ALT 35  ALKPHOS 156*  BILITOT 0.5   ------------------------------------------------------------------------------------------------------------------  Cardiac Enzymes Recent Labs  Lab 04/09/17 1307  TROPONINI <0.03   ------------------------------------------------------------------------------------------------------------------  RADIOLOGY:  Dg Shoulder Right  Result Date: 04/10/2017 CLINICAL DATA:  Right shoulder pain.  Fell yesterday. EXAM: RIGHT SHOULDER - 2+ VIEW COMPARISON:  Right shoulder MRI 03/03/2017 FINDINGS: There is no evidence of acute fracture or dislocation. Mild AC joint spurring is noted. The visualized portion of the right lung is clear. IMPRESSION: No acute osseous abnormality. Electronically Signed   By: Logan Bores M.D.   On: 04/10/2017 11:23   Mr Brain Wo Contrast  Result Date: 04/10/2017 CLINICAL DATA:  Initial evaluation for acute syncope, right upper and lower with left lower extremity heaviness, tingling. EXAM: MRI HEAD WITHOUT CONTRAST MRA HEAD WITHOUT CONTRAST TECHNIQUE: Multiplanar, multiecho pulse sequences of the brain and surrounding structures were obtained without intravenous  contrast. Angiographic images of the head were obtained using MRA technique without contrast. COMPARISON:  Prior CT from 04/09/2017 FINDINGS: MRI HEAD FINDINGS Brain: Generalized age-related cerebral atrophy. Patchy and confluent T2/FLAIR hyperintensity within the periventricular and deep white matter both cerebral hemispheres, most consistent with chronic small vessel ischemic disease. Multiple scatter remote lacunar infarcts present within the bilateral basal ganglia/corona radiata, as well as the bilateral thalami. Multiple remote lacunar infarcts present within the pons. There is small focus of patchy diffusion abnormality within the subcortical white matter of the anterior right centrum semi ovale measuring 14 mm (series 100, image 35), consistent with an acute to subacute ischemic infarct. Additional mild diffusion abnormality with associated T2/FLAIR signal within the subcortical and deep white matter of the posterior left parietal region also consistent with ischemia, likely subacute in nature (series 100, image 33). No associated hemorrhage or mass effect. No other evidence for acute or subacute ischemia. Gray-white matter differentiation otherwise maintained. No evidence for acute intracranial hemorrhage. Single punctate chronic microhemorrhage noted within the posterior right centrum semi ovale. No other evidence for chronic hemorrhage. No mass lesion, midline shift or mass effect. No hydrocephalus. No extra-axial fluid collection. Major dural sinuses are grossly patent. Pituitary gland suprasellar region normal. Midline structures intact. Vascular: Major intracranial vascular flow voids maintained. Skull and upper cervical spine: Craniocervical junction normal. Bone marrow signal intensity diffusely decreased on T1 weighted imaging, most commonly related to anemia, smoking, or obesity. No scalp soft tissue abnormality. Visualized upper cervical spine unremarkable. Sinuses/Orbits: Globes and orbital soft  tissues within normal limits. Scattered mucosal thickening throughout the paranasal sinuses. No mastoid effusion. Inner ear structures normal. Other: None. MRA HEAD FINDINGS ANTERIOR CIRCULATION: Distal cervical segments of the internal carotid arteries are patent with antegrade flow. Petrous segments widely patent bilaterally. Atheromatous irregularity within the cavernous/supraclinoid left ICA with mild multifocal narrowing. Cavernous and supraclinoid right ICA widely patent. Widely patent right A1 segment. Left A1 hypoplastic and/or absent, accounting for the diminutive left ICA is compared to the right. Normal anterior communicating artery. Anterior cerebral arteries perfused to their distal aspects without flow-limiting stenosis. Patent M1 segments without stenosis. Normal MCA bifurcations. No proximal M2 occlusion. Distal MCA branches well perfused and symmetric. Distal small vessel atheromatous irregularity. POSTERIOR CIRCULATION: Vertebral arteries not well evaluated on this exam. Suggestive dominant right vertebral artery with hypoplastic left vertebral artery. Right PICA partially visualized and patent. Left PICA not evaluated. Basilar artery patent to its distal aspect without stenosis. Superior cerebral arteries patent bilaterally. Left PCA supplied via the basilar. Hypoplastic right P1 with prominent right posterior communicating artery. There are severe tandem bilateral P2 stenoses (series 102, image 7) PCAs otherwise patent to their distal aspects. Distal small vessel atheromatous irregularity. No aneurysm or vascular malformation. IMPRESSION: MRI HEAD IMPRESSION: 1. Patchy small volume acute to subacute ischemic infarcts involving the white matter of the right centrum semi ovale and posterior left parietal region as above. No associated hemorrhage or mass effect. 2. Advanced chronic microvascular ischemic disease for age with multiple remote lacunar infarcts involving the bilateral basal  ganglia/corona radiata, thalami, and pons. MRA HEAD IMPRESSION: 1. Negative intracranial MRA for large vessel occlusion. No high-grade proximal or correctable stenosis identified. 2. Tandem severe left P2 stenoses. 3. Mild-to-moderate distal small vessel atheromatous irregularity. Electronically Signed   By: Jeannine Boga M.D.   On: 04/10/2017 01:43   US Carotid Bilateral (at [facility] Only)  Result Date: 04/09/2017 CLINICAL DATA:  47 year old female with a history of  syncope. Cardiovascular risk factors include hypertension, known prior stroke/TIA, hyperlipidemia, diabetes EXAM: BILATERAL CAROTID DUPLEX ULTRASOUND TECHNIQUE: Pearline Cables scale imaging, color Doppler and duplex ultrasound were performed of bilateral carotid and vertebral arteries in the neck. COMPARISON:  No prior duplex FINDINGS: Criteria: Quantification of carotid stenosis is based on velocity parameters that correlate the residual internal carotid diameter with NASCET-based stenosis levels, using the diameter of the distal internal carotid lumen as the denominator for stenosis measurement. The following velocity measurements were obtained: RIGHT ICA:  Systolic 79 cm/sec, Diastolic 29 cm/sec CCA:  70 cm/sec SYSTOLIC ICA/CCA RATIO:  1.1 ECA:  50 cm/sec LEFT ICA:  Systolic 75 cm/sec, Diastolic 27 cm/sec CCA:  073 cm/sec SYSTOLIC ICA/CCA RATIO:  0.5 ECA:  87 cm/sec Right Brachial SBP: Not acquired Left Brachial SBP: Not acquired RIGHT CAROTID ARTERY: No significant calcifications of the right common carotid artery. Intermediate waveform maintained. Heterogeneous and partially calcified plaque at the right carotid bifurcation. No significant lumen shadowing. Low resistance waveform of the right ICA. No significant tortuosity. RIGHT VERTEBRAL ARTERY: Antegrade flow with low resistance waveform. LEFT CAROTID ARTERY: No significant calcifications of the left common carotid artery. Intermediate waveform maintained. Heterogeneous and partially  calcified plaque at the left carotid bifurcation without significant lumen shadowing. Low resistance waveform of the left ICA. No significant tortuosity. LEFT VERTEBRAL ARTERY:  Antegrade flow with low resistance waveform. IMPRESSION: Color duplex indicates minimal heterogeneous and calcified plaque, with no hemodynamically significant stenosis by duplex criteria in the extracranial cerebrovascular circulation. Signed, Dulcy Fanny. Earleen Newport, DO [facility] Electronically Signed   By: Corrie Mckusick D.O.   On: 04/09/2017 16:32   Mr Jodene Nam Head/brain XT Cm  Result Date: 04/10/2017 CLINICAL DATA:  Initial evaluation for acute syncope, right upper and lower with left lower extremity heaviness, tingling. EXAM: MRI HEAD WITHOUT CONTRAST MRA HEAD WITHOUT CONTRAST TECHNIQUE: Multiplanar, multiecho pulse sequences of the brain and surrounding structures were obtained without intravenous contrast. Angiographic images of the head were obtained using MRA technique without contrast. COMPARISON:  Prior CT from 04/09/2017 FINDINGS: MRI HEAD FINDINGS Brain: Generalized age-related cerebral atrophy. Patchy and confluent T2/FLAIR hyperintensity within the periventricular and deep white matter both cerebral hemispheres, most consistent with chronic small vessel ischemic disease. Multiple scatter remote lacunar infarcts present within the bilateral basal ganglia/corona radiata, as well as the bilateral thalami. Multiple remote lacunar infarcts present within the pons. There is small focus of patchy diffusion abnormality within the subcortical white matter of the anterior right centrum semi ovale measuring 14 mm (series 100, image 35), consistent with an acute to subacute ischemic infarct. Additional mild diffusion abnormality with associated T2/FLAIR signal within the subcortical and deep white matter of the posterior left parietal region also consistent with ischemia, likely subacute  in nature (series 100, image 33). No associated hemorrhage or mass effect. No other evidence for acute or subacute ischemia. Gray-white matter differentiation otherwise maintained. No evidence for acute intracranial hemorrhage. Single punctate chronic microhemorrhage noted within the posterior right centrum semi ovale. No other evidence for chronic hemorrhage. No mass lesion, midline shift or mass effect. No hydrocephalus. No extra-axial fluid collection. Major dural sinuses are grossly patent. Pituitary gland suprasellar region normal. Midline structures intact. Vascular: Major intracranial vascular flow voids maintained. Skull and upper cervical spine: Craniocervical junction normal. Bone marrow signal intensity diffusely decreased on T1 weighted imaging, most commonly related to anemia, smoking, or obesity. No scalp soft tissue abnormality. Visualized upper cervical spine unremarkable. Sinuses/Orbits: Globes and  orbital soft tissues within normal limits. Scattered mucosal thickening throughout the paranasal sinuses. No mastoid effusion. Inner ear structures normal. Other: None. MRA HEAD FINDINGS ANTERIOR CIRCULATION: Distal cervical segments of the internal carotid arteries are patent with antegrade flow. Petrous segments widely patent bilaterally. Atheromatous irregularity within the cavernous/supraclinoid left ICA with mild multifocal narrowing. Cavernous and supraclinoid right ICA widely patent. Widely patent right A1 segment. Left A1 hypoplastic and/or absent, accounting for the diminutive left ICA is compared to the right. Normal anterior communicating artery. Anterior cerebral arteries perfused to their distal aspects without flow-limiting stenosis. Patent M1 segments without stenosis. Normal MCA bifurcations. No proximal M2 occlusion. Distal MCA branches well perfused and symmetric. Distal small vessel atheromatous irregularity. POSTERIOR CIRCULATION: Vertebral arteries not well evaluated on this exam.  Suggestive dominant right vertebral artery with hypoplastic left vertebral artery. Right PICA partially visualized and patent. Left PICA not evaluated. Basilar artery patent to its distal aspect without stenosis. Superior cerebral arteries patent bilaterally. Left PCA supplied via the basilar. Hypoplastic right P1 with prominent right posterior communicating artery. There are severe tandem bilateral P2 stenoses (series 102, image 7) PCAs otherwise patent to their distal aspects. Distal small vessel atheromatous irregularity. No aneurysm or vascular malformation. IMPRESSION: MRI HEAD IMPRESSION: 1. Patchy small volume acute to subacute ischemic infarcts involving the white matter of the right centrum semi ovale and posterior left parietal region as above. No associated hemorrhage or mass effect. 2. Advanced chronic microvascular ischemic disease for age with multiple remote lacunar infarcts involving the bilateral basal ganglia/corona radiata, thalami, and pons. MRA HEAD IMPRESSION: 1. Negative intracranial MRA for large vessel occlusion. No high-grade proximal or correctable stenosis identified. 2. Tandem severe left P2 stenoses. 3. Mild-to-moderate distal small vessel atheromatous irregularity. Electronically Signed   By: Jeannine Boga M.D.   On: 04/10/2017 01:43    EKG:   Orders placed or performed during the hospital encounter of 04/09/17  . EKG 12-Lead  . EKG 12-Lead  . ED EKG  . ED EKG  . EKG    ASSESSMENT AND PLAN:    # Syncope secondary to acute CVA and orthostatic hypotension/hypoglycemia Provided IV fluids and repeat orthostatics CT head is negative and echocardiogram was recently performed in March 19, 2017 carotid Dopplers-no significant stenosis Hydrated with IV fluids and monitor in telemetry, dizziness resolved  #Diabetes mellitus with severe hypoglycemia Patient is started on D5 at 75 mL/h we will continue close monitoring Accu-Cheks every hour and wean off the drip as  tolerated Patient Synthyroid is resumed  #Acute CVA CT head is negative MRI of the brain-patchy acute versus subacute infarcts ischemic  carotid Dopplers no significant stenosis Echocardiogram was performed on March 19, 2017 which has revealed 60-65% ejection fraction and no regional wall motion abnormalities Speech therapy elevated the patient and recommending dysphagia 3 diet  PT assessment  discharge patient with aspirin 325 mg and high intensity statin LDL 88 Neurology  recommendations appreciated  #Recent history of rt  shoulder rotator cuff surgery by Dr. [name] of the shoulder with no acute findings Patient was seen and evaluated by Dr. Marry Guan, She should continue with the sling and abduction pillow.  She is cleared for physical therapy as per the stroke rehabilitation, but she should refrain from any active range of motion or strengthening until she is reevaluated by Dr. [name].   #Chronic  kidney disease Baseline creatinine seems to be at 3.0 today patient is a 2.0 continue close monitoring and avoid nephrotoxins PCP may  consider referring the patient to nephrology on outpatient basis  #History of coronary artery disease status post drug-eluting stent placement  troponin negative and patient is a symptomatic. Resume home medications once patient tolerates diet and pass a swallow evaluation  #Hypothyroidism resume Synthroid   PT is recommending outpatient physical therapy  All the records are reviewed and case discussed with Care Management/Social Workerr. Management plans discussed with the patient, family and they are in agreement.  CODE STATUS: dnr  TOTAL TIME TAKING CARE OF THIS PATIENT: 36  minutes.   POSSIBLE D/C IN 1 DAYS, DEPENDING ON CLINICAL CONDITION.  Note: This dictation was prepared with Dragon dictation along with smaller phrase technology. Any transcriptional errors that result from this process are unintentional.   Nicholes Mango M.D on  04/11/2017 at 2:17 PM  Between 7am to 6pm - Pager - [phone] After 6pm go to [email or web address] - password [email or web address] [facility]  Office  [phone]  CC: Primary care physician; [name], MD

## 2017-04-11 NOTE — Care Management Obs Status (Signed)
[facility] NOTIFICATION   Patient Details  Name: [name] MRN: [id number] Date of Birth: Jan 15, 1971   Medicare Observation Status Notification Given:  Yes    Daija Routson A, RN 04/11/2017, 12:44 PM

## 2017-04-11 NOTE — Progress Notes (Signed)
Expressed concerns with Dr Margaretmary Eddy via telephone call of pt's concerns regarding her constant headache unable to relieve with PRN pain medications this shift; POCT CBG results up and down this shift; Dr Margaretmary Eddy advised this is part of hypoglycemia; pt's Hgb A1C = 5.3; pt now on D5 IVF at 100 ml/hr; checking POCT CBG every hour; passed MRI results positive for CVA; started back on Synthroid 21mcg this am per [facility] order; Dr Margaretmary Eddy advised that we would continue to monitor the pt and call if results < 50; no further orders received

## 2017-04-11 NOTE — Progress Notes (Signed)
Initial Nutrition Assessment  DOCUMENTATION CODES:   Obesity unspecified  INTERVENTION:  Encouraged adequate intake of calories and protein at meals though small, frequent meals in setting of patient's early satiety.  Provide Premier Protein po BID, each supplement provides 160 kcal and 30 grams of protein.  Provide daily MVI.  After discharge patient needs to take bariatric multivitamin with mineral once daily, and calcium citrate 500 mg TID.  NUTRITION DIAGNOSIS:   Inadequate oral intake related to poor appetite, early satiety, nausea, vomiting, diarrhea as evidenced by per patient/family report.  GOAL:   Patient will meet greater than or equal to 90% of their needs  MONITOR:   PO intake, Supplement acceptance, Labs, Weight trends, I & O's  REASON FOR ASSESSMENT:   Malnutrition Screening Tool    ASSESSMENT:   47 year old female with PMHx of gastric bypass 09/2009, bipolar disorder, iron deficiency anemia, CAD s/p drug-eluting stent placement, hx MI 06/03/2015, CKD, DM type 2 who is admitted with syncope, acute CVA.   Met with patient at bedside. She reports she has had a poor appetite for years now. She experiences early satiety, occasional nausea, abdominal pain, and diarrhea. She is also having some difficulty swallowing and feels like food is getting stuck occasionally. She reports she is followed by GI for this and is scheduled for a colonoscopy and EGD at the end of March. She reports she does not eat any full meals during the day. She snacks during the day. She prepares dinner at night but only takes a few bites of that. RD tried to get more information from pt on intake, but she is reluctant to provide much information. Reports she does not have protein at her snacks and may only take a few bites of chicken at dinner. She is unable to report how many snacks she eats during the day. She reports she has never tried any oral nutrition supplements before. She does not currently  take any vitamin/mineral supplements.  Patient reports she is unsure of her UBW as her weight fluctuates significantly. Could not find weight in chart from before gastric bypass in 2011. She was 168.5 lbs on 03/19/2016 and has remained weight-stable over the past year.  Meal Completion: 100%  Medications reviewed and include: Novolog 0-9 units TID, Novolog 0-5 units QHS, levothyroxine, D5NS at 75 mL/hr (90 grams dextrose, 306 kcal daily).  Labs reviewed: CBG 22-77  Patient does not meet criteria for malnutrition at this time.  NUTRITION - FOCUSED PHYSICAL EXAM:    Most Recent Value  Orbital Region  No depletion  Upper Arm Region  No depletion  Thoracic and Lumbar Region  No depletion  Buccal Region  No depletion  Temple Region  No depletion  Clavicle Bone Region  No depletion  Clavicle and Acromion Bone Region  No depletion  Scapular Bone Region  No depletion  Dorsal Hand  No depletion  Patellar Region  No depletion  Anterior Thigh Region  No depletion  Posterior Calf Region  No depletion  Edema (RD Assessment)  None  Hair  Reviewed  Eyes  Reviewed  Mouth  Reviewed  Skin  Reviewed  Nails  Reviewed     Diet Order:  Fall precautions DIET DYS 3 Room service appropriate? Yes with Assist; Fluid consistency: Thin Aspiration precautions  EDUCATION NEEDS:   Education needs have been addressed  Skin:  Skin Assessment: Reviewed RN Assessment(ecchymosis to lower abdomen)  Last BM:  04/11/2017 - large type 6  Height:  Ht Readings from Last 1 Encounters:  04/09/17 _0  (1.575 m)    Weight:   Wt Readings from Last 1 Encounters:  04/09/17 166 lb 3.2 oz (75.4 kg)    Ideal Body Weight:  50 kg  BMI:  Body mass index is 30.4 kg/m.  Estimated Nutritional Needs:   Kcal:  5597-4163 (MSJ x 1.2-1.4)  Protein:  75-90 grams (1-1.2 grams/kg)  Fluid:  1.5-1.75 L/day (30-35 mL/kg IBW)  Willey Blade, MS, RD, LDN Office: [phone] Pager: [phone] After  Hours/Weekend Pager: [phone]

## 2017-04-11 NOTE — Consult Note (Signed)
Referring Physician: Reita Cliche    Chief Complaint: Syncope, paresthesias  States she is at baseline at this time.  But periods of hypoglycemia   Past Medical History:  Diagnosis Date  . Anemia    iron deficiency anemia  . Aortic arch aneurysm ([place])   . Bipolar disorder ([place])   . BRCA negative 2014  . Colon polyp   . Family history of breast cancer    BRCA neg 2014  . Gastric ulcer 04/27/2011  . Headache    Migraines  . Malignant melanoma of skin of scalp ([place])   . MI, acute, non ST segment elevation ([place])   . Morbid obesity ([place])    a. s/p gastric bypass.  . Myocardial infarction ([place]) 06/03/2015  . Neuromuscular disorder ([place])   . S/P drug eluting coronary stent placement 06/04/2015    Past Surgical History:  Procedure Laterality Date  . APPENDECTOMY    . CARDIAC CATHETERIZATION N/A 11/09/2014   Procedure: Coronary Angiography;  Surgeon: Minna Merritts, MD;  Location: [place] [facility];  Service: Cardiovascular;  Laterality: N/A;  . CARDIAC CATHETERIZATION N/A 11/12/2014   Procedure: Coronary Stent Intervention;  Surgeon: Isaias Cowman, MD;  Location: [place] [facility];  Service: Cardiovascular;  Laterality: N/A;  . CARDIAC CATHETERIZATION N/A 04/18/2015   Procedure: Left Heart Cath and Coronary Angiography;  Surgeon: Minna Merritts, MD;  Location: [place] [facility];  Service: Cardiovascular;  Laterality: N/A;  . CARDIAC CATHETERIZATION Left 06/04/2015   Procedure: Left Heart Cath and Coronary Angiography;  Surgeon: Wellington Hampshire, MD;  Location: [facility];  Service: Cardiovascular;  Laterality: Left;  . CARDIAC CATHETERIZATION N/A 06/04/2015   Procedure: Coronary Stent Intervention;  Surgeon: Wellington Hampshire, MD;  Location: [place] [facility];  Service: Cardiovascular;  Laterality: N/A;  . CESAREAN SECTION  2001  . CHOLECYSTECTOMY N/A 11/18/2016   Procedure: LAPAROSCOPIC CHOLECYSTECTOMY WITH INTRAOPERATIVE CHOLANGIOGRAM;  Surgeon: Christene Lye, MD;  Location: [facility];  Service: General;  Laterality: N/A;  . COLONOSCOPY WITH PROPOFOL N/A 04/27/2016   Procedure: COLONOSCOPY WITH PROPOFOL;  Surgeon: [name], MD;  Location: [facility];  Service: Endoscopy;  Laterality: N/A;  . CORONARY ANGIOPLASTY    . DILATION AND CURETTAGE OF UTERUS    . ESOPHAGOGASTRODUODENOSCOPY (EGD) WITH PROPOFOL N/A 09/14/2014   Procedure: ESOPHAGOGASTRODUODENOSCOPY (EGD) WITH PROPOFOL;  Surgeon: Josefine Class, MD;  [facility];  Service: Endoscopy;  Laterality: N/A;  . ESOPHAGOGASTRODUODENOSCOPY (EGD) WITH PROPOFOL N/A 04/27/2016   Procedure: ESOPHAGOGASTRODUODENOSCOPY (EGD) WITH PROPOFOL;  Surgeon: [name], MD;  Location: [facility];  Service: Endoscopy;  Laterality: N/A;  Diabetic - oral meds  . GASTRIC BYPASS  09/2009   [facility]   . Left Carotid to sublcavian artery bypass w/ subclavian artery ligation     a. Performed @ [facility].  . MELANOMA EXCISION  2016   Dr. Evorn Gong  . [place]  2002  . RIGHT OOPHORECTOMY    . SHOULDER ARTHROSCOPY WITH OPEN ROTATOR CUFF REPAIR Right 01/07/2016   Procedure: SHOULDER ARTHROSCOPY WITH DEBRIDMENT, SUBACHROMIAL DECOMPRESSION;  Surgeon: Corky Mull, MD;  Location: [facility];  Service: Orthopedics;  Laterality: Right;  . SHOULDER ARTHROSCOPY WITH OPEN ROTATOR CUFF REPAIR Right 03/16/2017   Procedure: SHOULDER ARTHROSCOPY WITH OPEN ROTATOR CUFF REPAIR POSSIBLE BICEPS TENODESIS;  Surgeon: Corky Mull, MD;  Location: [facility];  Service: Orthopedics;  Laterality: Right;  . TRIGGER FINGER RELEASE Right     Middle  Finger    Family History  Problem Relation Age of Onset  . Hypertension Mother   . Anxiety disorder Mother   . Depression Mother   . Bipolar disorder Mother   . Heart disease Mother   . Hyperlipidemia Mother   . Kidney disease Father   . Heart disease Father   . Hypertension Father   . Diabetes Father   . Stroke Father   .  Colon cancer Father        dx in his 35's  . Anxiety disorder Father   . Depression Father   . Skin cancer Father   . Kidney disease Sister   . Thyroid nodules Sister   . Hypertension Sister   . Hypertension Sister   . Diabetes Sister   . Hyperlipidemia Sister   . Depression Sister   . Breast cancer Maternal Aunt 49  . Breast cancer Maternal Aunt 28  . Ovarian cancer Cousin   . Colon cancer Cousin   . Breast cancer Other   . Kidney cancer Neg Hx   . Bladder Cancer Neg Hx    Social History:  reports that she quit smoking about 22 years ago. Her smoking use included cigarettes. she has never used smokeless tobacco. She reports that she does not drink alcohol or use drugs.  Allergies:  Allergies  Allergen Reactions  . Tramadol     Mouth feels like it's on fire    Medications: I have reviewed the patient's current medications. Prior to Admission:  Prior to Admission medications   Medication Sig Start Date End Date Taking? Authorizing Provider  ALPRAZolam Duanne Moron) 0.5 MG tablet Take 0.5-1 tablets (0.25-0.5 mg total) by mouth every 6 (six) hours as needed for anxiety. 03/22/17  Yes Wieting, Delfino Lovett, MD  aspirin EC 81 MG tablet Take 1 tablet (81 mg total) by mouth daily. 02/19/15  Yes Hildred Priest, MD  Blood Glucose Monitoring Suppl (ONE TOUCH ULTRA 2) w/Device KIT Use to check blood sugar once a day. Dx. E11.9 03/18/17  Yes [name], MD  buPROPion (WELLBUTRIN XL) 150 MG 24 hr tablet Take 1 tablet (150 mg total) by mouth daily. 03/18/17  Yes [name], MD  citalopram (CELEXA) 40 MG tablet Take 1 tablet (40 mg total) by mouth daily. 01/13/17  Yes [name], MD  diltiazem (CARDIZEM CD) 120 MG 24 hr capsule Take 1 capsule (120 mg total) by mouth daily. 01/31/16  Yes Minna Merritts, MD  ezetimibe (ZETIA) 10 MG tablet Take 1 tablet (10 mg total) by mouth at bedtime. 03/22/17  Yes Wieting, Richard, MD  gabapentin (NEURONTIN) 300 MG capsule Take 1 capsule  (300 mg total) by mouth 2 (two) times daily. 03/22/17 03/22/18 Yes Wieting, Richard, MD  glipiZIDE (GLUCOTROL XL) 2.5 MG 24 hr tablet Take 1 tablet (2.5 mg total) by mouth daily with breakfast. 03/31/17  Yes Fisher, Kirstie Peri, MD  hyoscyamine (LEVSIN SL) 0.125 MG SL tablet Place 1 tablet (0.125 mg total) under the tongue every 6 (six) hours as needed. Patient taking differently: Place 0.125 mg under the tongue every 6 (six) hours as needed for cramping.  02/01/17  Yes [name], MD  levothyroxine (SYNTHROID, LEVOTHROID) 25 MCG tablet Take 1 tablet (25 mcg total) daily before breakfast by mouth. 12/18/16  Yes [name], MD  metoprolol succinate (TOPROL-XL) 25 MG 24 hr tablet Take 1 tablet (25 mg total) by mouth daily. 03/16/17  Yes Poggi, Marshall Cork, MD  oxyCODONE (OXY IR/ROXICODONE)  5 MG immediate release tablet Take 0.5 tablets (2.5 mg total) by mouth every 6 (six) hours as needed for moderate pain or severe pain. 03/22/17  Yes Wieting, Richard, MD  pantoprazole (PROTONIX) 40 MG tablet Take 1 tablet (40 mg total) by mouth 2 (two) times daily. 05/11/16  Yes [name], MD  acetaminophen (TYLENOL) 325 MG tablet Take 2 tablets (650 mg total) by mouth every 6 (six) hours as needed for mild pain (or Fever >/= 101). 03/22/17   Loletha Grayer, MD  nitroGLYCERIN (NITROSTAT) 0.4 MG SL tablet Place 1 tablet (0.4 mg total) under the tongue every 5 (five) minutes as needed for chest pain. 04/30/15   Minna Merritts, MD    ROS: History obtained from the patient  General ROS: negative for - chills, fatigue, fever, night sweats, weight gain or weight loss Psychological ROS: negative for - behavioral disorder, hallucinations, memory difficulties, mood swings or suicidal ideation Ophthalmic ROS: blurry vision ENT ROS: negative for - epistaxis, nasal discharge, oral lesions, sore throat, tinnitus or vertigo Allergy and Immunology ROS: negative for - hives or itchy/watery eyes Hematological and Lymphatic  ROS: negative for - bleeding problems, bruising or swollen lymph nodes Endocrine ROS: negative for - galactorrhea, hair pattern changes, polydipsia/polyuria or temperature intolerance Respiratory ROS: negative for - cough, hemoptysis, shortness of breath or wheezing Cardiovascular ROS: negative for - chest pain, dyspnea on exertion, edema or irregular heartbeat Gastrointestinal ROS: negative for - abdominal pain, diarrhea, hematemesis, nausea/vomiting or stool incontinence Genito-Urinary ROS: negative for - dysuria, hematuria, incontinence or urinary frequency/urgency Musculoskeletal ROS: right shoulder pain Neurological ROS: as noted in HPI Dermatological ROS: negative for rash and skin lesion changes  Physical Examination: Blood pressure 126/88, pulse 99, temperature 97.8 F (36.6 C), temperature source Oral, resp. rate 20, height _0  (1.575 m), weight 166 lb 3.2 oz (75.4 kg), SpO2 93 %.  HEENT-  Normocephalic, no lesions, without obvious abnormality.  Normal external eye and conjunctiva.  Normal TM's bilaterally.  Normal auditory canals and external ears. Normal external nose, mucus membranes and septum.  Normal pharynx. Cardiovascular- S1, S2 normal, pulses palpable throughout   Lungs- chest clear, no wheezing, rales, normal symmetric air entry Abdomen- soft, non-tender; bowel sounds normal; no masses,  no organomegaly Extremities- no edema Lymph-no adenopathy palpable Musculoskeletal-pain on palpation of right shoulder, left calf pain Skin-warm and dry, no hyperpigmentation, vitiligo, or suspicious lesions  Neurological Examination   Mental Status: Lethargic, oriented, thought content appropriate.  Speech fluent without evidence of aphasia with some dysarthria noted.  Able to follow 3 step commands without difficulty. Cranial Nerves: II: Discs flat bilaterally; Visual fields grossly normal, pupils equal, round, reactive to light and accommodation III,IV, VI: ptosis not present,  extra-ocular motions intact bilaterally V,VII: smile symmetric, facial light touch sensation normal bilaterally VIII: hearing normal bilaterally IX,X: gag reflex present XI: bilateral shoulder shrug XII: midline tongue extension Motor: Right : Upper extremity   Difficult to test due to pain    Left:     Upper extremity   5/5  Lower extremity   4/5     Lower extremity   4-/5 Tone and bulk:normal tone throughout; no atrophy noted Sensory: Pinprick and light touch decreased in the RUE and LLE Deep Tendon Reflexes: 2+ with absent AJ's bilaterally Plantars: Right: mute   Left: mute Cerebellar: Normal finger-to-nose and normal heel-to-shin testing bilaterally Gait: not tested due to safety concerns   Laboratory Studies:  Basic Metabolic Panel: Recent Labs  Lab  04/09/17 1307  NA 139  K 4.1  CL 105  CO2 25  GLUCOSE 139*  BUN 20  CREATININE 2.06*  CALCIUM 8.6*    Liver Function Tests: Recent Labs  Lab 04/09/17 1307  AST 27  ALT 35  ALKPHOS 156*  BILITOT 0.5  PROT 6.5  ALBUMIN 3.6   No results for input(s): LIPASE, AMYLASE in the last 168 hours. No results for input(s): AMMONIA in the last 168 hours.  CBC: Recent Labs  Lab 04/09/17 1307  WBC 9.4  NEUTROABS 6.5  HGB 11.0*  HCT 33.8*  MCV 84.3  PLT 202    Cardiac Enzymes: Recent Labs  Lab 04/09/17 1307  TROPONINI <0.03    BNP: Invalid input(s): POCBNP  CBG: Recent Labs  Lab 04/11/17 0931 04/11/17 0932 04/11/17 0957 04/11/17 1110 04/11/17 1201  GLUCAP 22* 32* 57* 63* 25    Microbiology: Results for orders placed or performed during the hospital encounter of 03/18/17  Blood Culture (routine x 2)     Status: None   Collection Time: 03/18/17  8:08 PM  Result Value Ref Range Status   Specimen Description BLOOD BLOOD LEFT HAND  Final   Special Requests   Final    BOTTLES DRAWN AEROBIC AND ANAEROBIC Blood Culture adequate volume   Culture   Final    NO GROWTH 5 DAYS Performed at [facility], [address] [place], [place] [address]    Report Status 03/23/2017 FINAL  Final  Blood Culture (routine x 2)     Status: None   Collection Time: 03/18/17  8:08 PM  Result Value Ref Range Status   Specimen Description BLOOD LEFT ANTECUBITAL  Final   Special Requests   Final    BOTTLES DRAWN AEROBIC AND ANAEROBIC Blood Culture results may not be optimal due to an excessive volume of blood received in culture bottles   Culture   Final    NO GROWTH 5 DAYS Performed at [facility], [address] [place], [place] [address]    Report Status 03/23/2017 FINAL  Final  Urine culture     Status: None   Collection Time: 03/18/17  8:08 PM  Result Value Ref Range Status   Specimen Description   Final    URINE, RANDOM Performed at [facility], [address] [name], [place] [address]    Special Requests   Final    NONE Performed at [facility], [address] [place], [place] [address]    Culture   Final    NO GROWTH Performed at [facility], [place] [address] [place], [place] [address]    Report Status 03/20/2017 FINAL  Final  MRSA PCR Screening     Status: None   Collection Time: 03/18/17 11:50 PM  Result Value Ref Range Status   MRSA by PCR NEGATIVE NEGATIVE Final    Comment:        The GeneXpert MRSA Assay (FDA approved for NASAL specimens only), is one component of a comprehensive MRSA colonization surveillance program. It is not intended to diagnose MRSA infection nor to guide or monitor treatment for MRSA infections. Performed at [facility], [address] [place], [place] [address]     Coagulation Studies: Recent Labs    04/09/17 1307  LABPROT 13.4  INR 1.03    Urinalysis: No results for input(s): COLORURINE, LABSPEC, PHURINE, GLUCOSEU, HGBUR, BILIRUBINUR, KETONESUR, PROTEINUR, UROBILINOGEN, NITRITE, LEUKOCYTESUR in the last 168 hours.  Invalid input(s): APPERANCEUR  Lipid Panel:     Component Value Date/Time   CHOL 134 04/10/2017 0518   CHOL 159 05/28/2016 1212   CHOL 197 12/30/2013 0355   TRIG 85 04/10/2017 0518   TRIG 261 (H) 12/30/2013 0355   HDL 29 (L) 04/10/2017 0518   HDL 34 (L) 05/28/2016 1212   HDL 28 (L) 12/30/2013 0355   CHOLHDL 4.6 04/10/2017 0518   VLDL 17 04/10/2017 0518   VLDL 52 (H) 12/30/2013 0355   LDLCALC 88 04/10/2017 0518   LDLCALC 95 05/28/2016 1212   LDLCALC 117 (H) 12/30/2013 0355    HgbA1C:  Lab Results  Component Value Date   HGBA1C 5.2 04/10/2017    Urine Drug Screen:      Component Value Date/Time   LABOPIA POSITIVE (A) 06/04/2015 1153   LABOPIA POSITIVE (A) 07/10/2009 2040   COCAINSCRNUR NONE DETECTED 06/04/2015 1153   LABBENZ NONE DETECTED 06/04/2015 1153   LABBENZ NONE DETECTED 07/10/2009 2040   AMPHETMU NONE DETECTED 06/04/2015 1153   AMPHETMU NONE DETECTED 07/10/2009 2040   THCU NONE DETECTED 06/04/2015 1153   THCU NONE DETECTED 07/10/2009 2040   LABBARB NONE DETECTED 06/04/2015 1153   LABBARB  07/10/2009 2040    NONE DETECTED        DRUG SCREEN FOR MEDICAL PURPOSES ONLY.  IF CONFIRMATION IS NEEDED FOR ANY PURPOSE, NOTIFY LAB WITHIN 5 DAYS.        LOWEST DETECTABLE LIMITS FOR URINE DRUG SCREEN Drug Class       Cutoff (ng/mL) Amphetamine      1000 Barbiturate      200 Benzodiazepine   161 Tricyclics       096 Opiates          300 Cocaine          300 THC              50    Alcohol Level: No results for input(s): ETH in the last 168 hours.  Other results: EKG: normal sinus rhythm at 67 bpm.  Imaging: Dg Shoulder Right  Result Date: 04/10/2017 CLINICAL DATA:  Right shoulder pain.  Fell yesterday. EXAM: RIGHT SHOULDER - 2+ VIEW COMPARISON:  Right shoulder MRI 03/03/2017 FINDINGS: There is no evidence of acute fracture or dislocation. Mild AC joint spurring is noted. The visualized portion of the right lung is clear. IMPRESSION: No acute osseous abnormality. Electronically Signed   By: Logan Bores M.D.    On: 04/10/2017 11:23   Mr Brain Wo Contrast  Result Date: 04/10/2017 CLINICAL DATA:  Initial evaluation for acute syncope, right upper and lower with left lower extremity heaviness, tingling. EXAM: MRI HEAD WITHOUT CONTRAST MRA HEAD WITHOUT CONTRAST TECHNIQUE: Multiplanar, multiecho pulse sequences of the brain and surrounding structures were obtained without intravenous contrast. Angiographic images of the head were obtained using MRA technique without contrast. COMPARISON:  Prior CT from 04/09/2017 FINDINGS: MRI HEAD FINDINGS Brain: Generalized age-related cerebral atrophy. Patchy and confluent T2/FLAIR hyperintensity within the periventricular and deep white matter both cerebral hemispheres, most consistent with chronic small vessel ischemic disease. Multiple scatter remote lacunar infarcts present within the bilateral basal ganglia/corona radiata, as well as the bilateral thalami. Multiple remote lacunar infarcts present within the pons. There is small focus of patchy diffusion abnormality within the subcortical white matter of the anterior right centrum semi ovale measuring 14 mm (series 100, image 35), consistent with an acute to subacute ischemic infarct. Additional mild diffusion abnormality with associated T2/FLAIR signal within the subcortical and deep  white matter of the posterior left parietal region also consistent with ischemia, likely subacute in nature (series 100, image 33). No associated hemorrhage or mass effect. No other evidence for acute or subacute ischemia. Gray-white matter differentiation otherwise maintained. No evidence for acute intracranial hemorrhage. Single punctate chronic microhemorrhage noted within the posterior right centrum semi ovale. No other evidence for chronic hemorrhage. No mass lesion, midline shift or mass effect. No hydrocephalus. No extra-axial fluid collection. Major dural sinuses are grossly patent. Pituitary gland suprasellar region normal. Midline structures  intact. Vascular: Major intracranial vascular flow voids maintained. Skull and upper cervical spine: Craniocervical junction normal. Bone marrow signal intensity diffusely decreased on T1 weighted imaging, most commonly related to anemia, smoking, or obesity. No scalp soft tissue abnormality. Visualized upper cervical spine unremarkable. Sinuses/Orbits: Globes and orbital soft tissues within normal limits. Scattered mucosal thickening throughout the paranasal sinuses. No mastoid effusion. Inner ear structures normal. Other: None. MRA HEAD FINDINGS ANTERIOR CIRCULATION: Distal cervical segments of the internal carotid arteries are patent with antegrade flow. Petrous segments widely patent bilaterally. Atheromatous irregularity within the cavernous/supraclinoid left ICA with mild multifocal narrowing. Cavernous and supraclinoid right ICA widely patent. Widely patent right A1 segment. Left A1 hypoplastic and/or absent, accounting for the diminutive left ICA is compared to the right. Normal anterior communicating artery. Anterior cerebral arteries perfused to their distal aspects without flow-limiting stenosis. Patent M1 segments without stenosis. Normal MCA bifurcations. No proximal M2 occlusion. Distal MCA branches well perfused and symmetric. Distal small vessel atheromatous irregularity. POSTERIOR CIRCULATION: Vertebral arteries not well evaluated on this exam. Suggestive dominant right vertebral artery with hypoplastic left vertebral artery. Right PICA partially visualized and patent. Left PICA not evaluated. Basilar artery patent to its distal aspect without stenosis. Superior cerebral arteries patent bilaterally. Left PCA supplied via the basilar. Hypoplastic right P1 with prominent right posterior communicating artery. There are severe tandem bilateral P2 stenoses (series 102, image 7) PCAs otherwise patent to their distal aspects. Distal small vessel atheromatous irregularity. No aneurysm or vascular  malformation. IMPRESSION: MRI HEAD IMPRESSION: 1. Patchy small volume acute to subacute ischemic infarcts involving the white matter of the right centrum semi ovale and posterior left parietal region as above. No associated hemorrhage or mass effect. 2. Advanced chronic microvascular ischemic disease for age with multiple remote lacunar infarcts involving the bilateral basal ganglia/corona radiata, thalami, and pons. MRA HEAD IMPRESSION: 1. Negative intracranial MRA for large vessel occlusion. No high-grade proximal or correctable stenosis identified. 2. Tandem severe left P2 stenoses. 3. Mild-to-moderate distal small vessel atheromatous irregularity. Electronically Signed   By: Jeannine Boga M.D.   On: 04/10/2017 01:43   US Carotid Bilateral (at [facility] And [facility] Only)  Result Date: 04/09/2017 CLINICAL DATA:  47 year old female with a history of syncope. Cardiovascular risk factors include hypertension, known prior stroke/TIA, hyperlipidemia, diabetes EXAM: BILATERAL CAROTID DUPLEX ULTRASOUND TECHNIQUE: Pearline Cables scale imaging, color Doppler and duplex ultrasound were performed of bilateral carotid and vertebral arteries in the neck. COMPARISON:  No prior duplex FINDINGS: Criteria: Quantification of carotid stenosis is based on velocity parameters that correlate the residual internal carotid diameter with NASCET-based stenosis levels, using the diameter of the distal internal carotid lumen as the denominator for stenosis measurement. The following velocity measurements were obtained: RIGHT ICA:  Systolic 79 cm/sec, Diastolic 29 cm/sec CCA:  70 cm/sec SYSTOLIC ICA/CCA RATIO:  1.1 ECA:  50 cm/sec LEFT ICA:  Systolic 75 cm/sec, Diastolic 27 cm/sec CCA:  010 cm/sec SYSTOLIC ICA/CCA RATIO:  0.5 ECA:  87 cm/sec Right Brachial SBP: Not acquired Left Brachial SBP: Not acquired RIGHT CAROTID ARTERY: No significant calcifications of the right common carotid artery. Intermediate waveform maintained. Heterogeneous and partially  calcified plaque at the right carotid bifurcation. No significant lumen shadowing. Low resistance waveform of the right ICA. No significant tortuosity. RIGHT VERTEBRAL ARTERY: Antegrade flow with low resistance waveform. LEFT CAROTID ARTERY: No significant calcifications of the left common carotid artery. Intermediate waveform maintained. Heterogeneous and partially calcified plaque at the left carotid bifurcation without significant lumen shadowing. Low resistance waveform of the left ICA. No significant tortuosity. LEFT VERTEBRAL ARTERY:  Antegrade flow with low resistance waveform. IMPRESSION: Color duplex indicates minimal heterogeneous and calcified plaque, with no hemodynamically significant stenosis by duplex criteria in the extracranial cerebrovascular circulation. Signed, Dulcy Fanny. Earleen Newport, DO [facility] Electronically Signed   By: Corrie Mckusick D.O.   On: 04/09/2017 16:32   Mr Jodene Nam Head/brain MV Cm  Result Date: 04/10/2017 CLINICAL DATA:  Initial evaluation for acute syncope, right upper and lower with left lower extremity heaviness, tingling. EXAM: MRI HEAD WITHOUT CONTRAST MRA HEAD WITHOUT CONTRAST TECHNIQUE: Multiplanar, multiecho pulse sequences of the brain and surrounding structures were obtained without intravenous contrast. Angiographic images of the head were obtained using MRA technique without contrast. COMPARISON:  Prior CT from 04/09/2017 FINDINGS: MRI HEAD FINDINGS Brain: Generalized age-related cerebral atrophy. Patchy and confluent T2/FLAIR hyperintensity within the periventricular and deep white matter both cerebral hemispheres, most consistent with chronic small vessel ischemic disease. Multiple scatter remote lacunar infarcts present within the bilateral basal ganglia/corona radiata, as well as the bilateral thalami. Multiple remote lacunar infarcts present within the pons. There is small focus of patchy diffusion abnormality  within the subcortical white matter of the anterior right centrum semi ovale measuring 14 mm (series 100, image 35), consistent with an acute to subacute ischemic infarct. Additional mild diffusion abnormality with associated T2/FLAIR signal within the subcortical and deep white matter of the posterior left parietal region also consistent with ischemia, likely subacute in nature (series 100, image 33). No associated hemorrhage or mass effect. No other evidence for acute or subacute ischemia. Gray-white matter differentiation otherwise maintained. No evidence for acute intracranial hemorrhage. Single punctate chronic microhemorrhage noted within the posterior right centrum semi ovale. No other evidence for chronic hemorrhage. No mass lesion, midline shift or mass effect. No hydrocephalus. No extra-axial fluid collection. Major dural sinuses are grossly patent. Pituitary gland suprasellar region normal. Midline structures intact. Vascular: Major intracranial vascular flow voids maintained. Skull and upper cervical spine: Craniocervical junction normal. Bone marrow signal intensity diffusely decreased on T1 weighted imaging, most commonly related to anemia, smoking, or obesity. No scalp soft tissue abnormality. Visualized upper cervical spine unremarkable. Sinuses/Orbits: Globes and orbital soft tissues within normal limits. Scattered mucosal thickening throughout the paranasal sinuses. No mastoid effusion. Inner ear structures normal. Other: None. MRA HEAD FINDINGS ANTERIOR CIRCULATION: Distal cervical segments of the internal carotid arteries are patent with antegrade flow. Petrous segments widely patent bilaterally. Atheromatous irregularity within the cavernous/supraclinoid left ICA with mild multifocal narrowing. Cavernous and supraclinoid right ICA widely patent. Widely patent right A1 segment. Left A1 hypoplastic and/or absent, accounting for the diminutive left ICA is compared to the right. Normal anterior  communicating artery. Anterior cerebral arteries perfused to their distal aspects without flow-limiting stenosis. Patent M1 segments without stenosis. Normal MCA bifurcations. No proximal M2 occlusion. Distal MCA branches well perfused and symmetric. Distal small vessel atheromatous irregularity. POSTERIOR CIRCULATION:  Vertebral arteries not well evaluated on this exam. Suggestive dominant right vertebral artery with hypoplastic left vertebral artery. Right PICA partially visualized and patent. Left PICA not evaluated. Basilar artery patent to its distal aspect without stenosis. Superior cerebral arteries patent bilaterally. Left PCA supplied via the basilar. Hypoplastic right P1 with prominent right posterior communicating artery. There are severe tandem bilateral P2 stenoses (series 102, image 7) PCAs otherwise patent to their distal aspects. Distal small vessel atheromatous irregularity. No aneurysm or vascular malformation. IMPRESSION: MRI HEAD IMPRESSION: 1. Patchy small volume acute to subacute ischemic infarcts involving the white matter of the right centrum semi ovale and posterior left parietal region as above. No associated hemorrhage or mass effect. 2. Advanced chronic microvascular ischemic disease for age with multiple remote lacunar infarcts involving the bilateral basal ganglia/corona radiata, thalami, and pons. MRA HEAD IMPRESSION: 1. Negative intracranial MRA for large vessel occlusion. No high-grade proximal or correctable stenosis identified. 2. Tandem severe left P2 stenoses. 3. Mild-to-moderate distal small vessel atheromatous irregularity. Electronically Signed   By: Jeannine Boga M.D.   On: 04/10/2017 01:43    Assessment: 47 y.o. female presenting with an episode of dizziness, slurred speech and left/right extremity numbness.  Patient outside window for tPA.  Had a similar presentation a few weeks ago.  Lab work improved since that time.  Has been having issues with hypertension.  It  appears her antihypertensives have been reinitiated recently.  BP well controlled.  Unclear etiology of fall and current presentation.  On ASA daily and Wellbutrin.      Pt is back to baseline.  Recent hx of admission to hospital.  MRI reviewed and believe watershed subacute infarcts in setting of orthostasis.  R one right in MCA watershed and L one in the PCA/MCA territory  Dysatonomia with hypoglycemia and orthostatic hypotension  Will need endocrine follow up Possibly cortisol testing No further testing from Neurological stand point.

## 2017-04-11 NOTE — Discharge Summary (Addendum)
[facility] NAME: [name]    MR#:  [id number]  DATE OF BIRTH:  [birth year]  DATE OF ADMISSION:  04/09/2017 ADMITTING PHYSICIAN: Nicholes Mango, MD  DATE OF DISCHARGE: 04/13/17 PRIMARY CARE PHYSICIAN: [name], MD    ADMISSION DIAGNOSIS:  Left leg paresthesias [R20.2] Numbness and tingling of right arm [R20.0, R20.2] Unwitnessed fall [R29.6]  DISCHARGE DIAGNOSIS:  Syncope secondary to acute CVA, orthostatic hypotension and hypoglycemia Acute CVA  recent left shoulder rotator cuff surgery Diabetes mellitus-brittle Severe hypoglycemia  SECONDARY DIAGNOSIS:   Past Medical History:  Diagnosis Date  . Anemia    iron deficiency anemia  . 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 Bipolar disorder ([name])   . BRCA negative 2014  . Colon polyp   . Family history of breast cancer    BRCA neg 2014  . Gastric ulcer 04/27/2011  . Headache    Migraines  . Malignant melanoma of skin of scalp ([name])   . MI, acute, non ST segment elevation ([name])   . Morbid obesity ([name])    a. s/p gastric bypass.  . Myocardial infarction ([name]) 06/03/2015  . Neuromuscular disorder ([place])   . S/P drug eluting coronary stent placement 06/04/2015    HOSPITAL COURSE:   HPI  [name] [address] [place] [name]  is a 47 y.o. female with a known history of bipolar disorder, coronary artery disease status post drug-eluting coronary stent placement, iron deficiency anemia Is brought into the ED after she heavy in her legs she was feeling dizzy and had a syncopal episode.  Patient was reporting right upper extremity right lower extremity and left lower extremity heaviness associated with tingling and headache.  Reporting dysphagia.  CT head is negative.  Patient was recently admitted to the hospital in February treated for pneumonia and acute kidney injury and at that point her Neurontin dose was adjusted.  Currently patient denies any headache during my examination and asking  for pain medication.  Son at bedside.  As reported by the ED physician patient was seen by neurologist Dr. Doy Mince who has recommended TIA/CVAworkup and overnight admission   # Syncope secondary to acute CVA and orthostatic hypotension/hypoglycemia Provided IV fluids and patient's dizziness resolved CT head is negative and echocardiogram was recently performed in March 19, 2017 carotid Dopplers-no significant stenosis Hydrated with IV fluids and monitor in telemetry, dizziness resolved  #Diabetes mellitus with severe hypoglycemia Pt is brittle diabetic Patient was started on D5 and weaned off 3 / 12/2017 as hypoglycemia improved Continue close monitoring of BS  Discharging home with sliding scale only OP f/u with endo after d/c, pt might be a good candidate for insulin pump   #Acute CVA CT head is negative MRI of the brain-patchy acute versus subacute infarcts ischemic  carotid Dopplers no significant stenosis Echocardiogram was performedon March 19, 2017 which has revealed 60-65% ejection fraction and no regional wall motion abnormalities Speech therapy elevated the patient and recommending dysphagia 3 diet  PT assessment  discharge patient with aspirin 325 mg and high intensity statin LDL 88, patient is refusing to take Lipitor in view of severe leg cramps in the past.  Continue [facility]  recommendations appreciated.  Outpatient follow-up with neurology  #Recent history of rt  shoulder rotator cuff surgery by Dr. [name] of the shoulder with no acute findings Patient was seen and evaluated by Dr. [name] should continue with the sling and abduction pillow. She is cleared  for physical therapy as per the stroke rehabilitation, but she should refrain from any active range of motion or strengthening until she is reevaluated by Dr. [name].   #Chronic kidney disease Baseline creatinine seems to be at 3.0 ,patient is a 2.0 continue close monitoring and avoid  nephrotoxins PCP may consider referring the patient to nephrology on outpatient basis  #History of coronary artery disease status post drug-eluting stent placement  troponin negative and patient is a symptomatic. Resume home medications   #Hypothyroidism  Synthroid   PT is recommending outpatient physical therapy     DISCHARGE CONDITIONS:   FAIR  CONSULTS OBTAINED:  Treatment Team:  [name], MD [name], MD [name], MD   PROCEDURES NONE  DRUG ALLERGIES:   Allergies  Allergen Reactions  . Tramadol     Mouth feels like it's on fire    DISCHARGE MEDICATIONS:   Allergies as of 04/13/2017      Reactions   Tramadol    Mouth feels like it's on fire      Medication List    STOP taking these medications   losartan 100 MG tablet Commonly known as:  COZAAR   metoprolol succinate 25 MG 24 hr tablet Commonly known as:  TOPROL-XL     TAKE these medications   acetaminophen 325 MG tablet Commonly known as:  TYLENOL Take 2 tablets (650 mg total) by mouth every 6 (six) hours as needed for mild pain (or Fever >/= 101).   ALPRAZolam 0.5 MG tablet Commonly known as:  XANAX Take 0.5-1 tablets (0.25-0.5 mg total) by mouth every 6 (six) hours as needed for anxiety.   aspirin 325 MG EC tablet Take 1 tablet (325 mg total) by mouth daily. Start taking on:  04/14/2017 What changed:    medication strength  how much to take   atorvastatin 40 MG tablet Commonly known as:  LIPITOR Take 1 tablet (40 mg total) by mouth daily at 6 PM.   buPROPion 150 MG 24 hr tablet Commonly known as:  WELLBUTRIN XL Take 1 tablet (150 mg total) by mouth daily.   citalopram 40 MG tablet Commonly known as:  CELEXA Take 1 tablet (40 mg total) by mouth daily.   diltiazem 120 MG 24 hr capsule Commonly known as:  CARDIZEM CD Take 1 capsule (120 mg total) by mouth daily.   ezetimibe 10 MG tablet Commonly known as:  ZETIA Take 1 tablet (10 mg total) by mouth  at bedtime.   gabapentin 300 MG capsule Commonly known as:  NEURONTIN Take 1 capsule (300 mg total) by mouth 2 (two) times daily.   hyoscyamine 0.125 MG SL tablet Commonly known as:  LEVSIN SL Place 1 tablet (0.125 mg total) under the tongue every 6 (six) hours as needed. What changed:  reasons to take this   insulin aspart 100 UNIT/ML injection Commonly known as:  novoLOG Inject 0-9 Units into the skin 3 (three) times daily with meals. CBG < 70: implement hypoglycemia protocol CBG 70 - 120: 0 units CBG 121 - 150: 1 unit CBG 151 - 200: 2 units CBG 201 - 250: 3 units CBG 251 - 300: 5 units CBG 301 - 350: 7 units CBG 351 - 400: 9 units CBG > 400: call MD and obtain STAT lab verification   levothyroxine 25 MCG tablet Commonly known as:  SYNTHROID, LEVOTHROID Take 1 tablet (25 mcg total) daily before breakfast by mouth.   multivitamin with minerals Tabs tablet Take 1 tablet by  mouth daily. Start taking on:  04/14/2017   nitroGLYCERIN 0.4 MG SL tablet Commonly known as:  NITROSTAT Place 1 tablet (0.4 mg total) under the tongue every 5 (five) minutes as needed for chest pain.   ONE TOUCH ULTRA 2 w/Device Kit Use to check blood sugar once a day. Dx. E11.9   oxyCODONE 5 MG immediate release tablet Commonly known as:  Oxy IR/ROXICODONE Take 0.5 tablets (2.5 mg total) by mouth every 6 (six) hours as needed for moderate pain or severe pain.   pantoprazole 40 MG tablet Commonly known as:  PROTONIX Take 1 tablet (40 mg total) by mouth 2 (two) times daily.   protein supplement shake Liqd Commonly known as:  PREMIER PROTEIN Take 325 mLs (11 oz total) by mouth 2 (two) times daily between meals.        DISCHARGE INSTRUCTIONS:   Follow-up with primary care physician in 5-7 days Outpatient follow-up with nephrology in 1 month  follow-up with Dr. [name] orthopedics as recommended Follow-up with neurology in 2 weeks Follow-up with endocrinology in 5-7 days or sooner as  needed  DIET:  Cardiac diet and Diabetic diet-dysphagia 3  DISCHARGE CONDITION:  Fair  ACTIVITY:  Activity as tolerated  OXYGEN:  Home Oxygen: No.   Oxygen Delivery: room air  DISCHARGE LOCATION:  home   If you experience worsening of your admission symptoms, develop shortness of breath, life threatening emergency, suicidal or homicidal thoughts you must seek medical attention immediately by calling 911 or calling your MD immediately  if symptoms less severe.  You Must read complete instructions/literature along with all the possible adverse reactions/side effects for all the Medicines you take and that have been prescribed to you. Take any new Medicines after you have completely understood and accpet all the possible adverse reactions/side effects.   Please note  You were cared for by a hospitalist during your hospital stay. If you have any questions about your discharge medications or the care you received while you were in the hospital after you are discharged, you can call the unit and asked to speak with the hospitalist on call if the hospitalist that took care of you is not available. Once you are discharged, your primary care physician will handle any further medical issues. Please note that NO REFILLS for any discharge medications will be authorized once you are discharged, as it is imperative that you return to your primary care physician (or establish a relationship with a primary care physician if you do not have one) for your aftercare needs so that they can reassess your need for medications and monitor your lab values.     Today  Chief Complaint  Patient presents with  . Code Stroke   Patient is feeling much better.  Lower extremity heaviness improved.  Denies any headache or blurry vision today.  Okay to discharge patient from neurology and orthopedic standpoint after PT assessment ROS:  CONSTITUTIONAL: Denies fevers, chills. Denies any fatigue, weakness.  EYES:  Denies blurry vision, double vision, eye pain. EARS, NOSE, THROAT: Denies tinnitus, ear pain, hearing loss. RESPIRATORY: Denies cough, wheeze, shortness of breath.  CARDIOVASCULAR: Denies chest pain, palpitations, edema.  GASTROINTESTINAL: Denies nausea, vomiting, diarrhea, abdominal pain. Denies bright red blood per rectum. GENITOURINARY: Denies dysuria, hematuria. ENDOCRINE: Denies nocturia or thyroid problems. HEMATOLOGIC AND LYMPHATIC: Denies easy bruising or bleeding. SKIN: Denies rash or lesion. MUSCULOSKELETAL: Denies pain in neck, back, shoulder, knees, hips or arthritic symptoms.  NEUROLOGIC: Denies paralysis, paresthesias.  PSYCHIATRIC: Denies anxiety  or depressive symptoms.   VITAL SIGNS:  Blood pressure (!) 153/78, pulse 74, temperature 97.8 F (36.6 C), temperature source Oral, resp. rate 17, height '5\' 2"'$  (1.575 m), weight 75.4 kg (166 lb 3.2 oz), SpO2 98 %.  I/O:    Intake/Output Summary (Last 24 hours) at 04/13/2017 1257 Last data filed at 04/13/2017 0900 Gross per 24 hour  Intake 480 ml  Output -  Net 480 ml    PHYSICAL EXAMINATION:  GENERAL:  47 y.o.-year-old patient lying in the bed with no acute distress.  EYES: Pupils equal, round, reactive to light and accommodation. No scleral icterus. Extraocular muscles intact.  HEENT: Head atraumatic, normocephalic. Oropharynx and nasopharynx clear.  NECK:  Supple, no jugular venous distention. No thyroid enlargement, no tenderness.  LUNGS: Normal breath sounds bilaterally, no wheezing, rales,rhonchi or crepitation. No use of accessory muscles of respiration.  CARDIOVASCULAR: S1, S2 normal. No murmurs, rubs, or gallops.  ABDOMEN: Soft, non-tender, non-distended. Bowel sounds present. No organomegaly or mass.  EXTREMITIES: Right shoulder status post rotator cuff surgery  no pedal edema, cyanosis, or clubbing.  NEUROLOGIC: Cranial nerves II through XII are intact. Muscle strength 5/5 in all extremities except right upper  extremity. Sensation intact. Gait not checked.  PSYCHIATRIC: The patient is alert and oriented x 3.  SKIN: No obvious rash, lesion, or ulcer.   DATA REVIEW:   CBC Recent Labs  Lab 04/09/17 1307  WBC 9.4  HGB 11.0*  HCT 33.8*  PLT 202    Chemistries  Recent Labs  Lab 04/09/17 1307  NA 139  K 4.1  CL 105  CO2 25  GLUCOSE 139*  BUN 20  CREATININE 2.06*  CALCIUM 8.6*  AST 27  ALT 35  ALKPHOS 156*  BILITOT 0.5    Cardiac Enzymes Recent Labs  Lab 04/09/17 1307  [place] <0.03    Microbiology Results  Results for orders placed or performed during the hospital encounter of 03/18/17  Blood Culture (routine x 2)     Status: None   Collection Time: 03/18/17  8:08 PM  Result Value Ref Range Status   Specimen Description BLOOD BLOOD LEFT HAND  Final   Special Requests   Final    BOTTLES DRAWN AEROBIC AND ANAEROBIC Blood Culture adequate volume   Culture   Final    NO GROWTH 5 DAYS Performed at [facility], [address], [place], [place] [address]    Report Status 03/23/2017 FINAL  Final  Blood Culture (routine x 2)     Status: None   Collection Time: 03/18/17  8:08 PM  Result Value Ref Range Status   Specimen Description BLOOD LEFT ANTECUBITAL  Final   Special Requests   Final    BOTTLES DRAWN AEROBIC AND ANAEROBIC Blood Culture results may not be optimal due to an excessive volume of blood received in culture bottles   Culture   Final    NO GROWTH 5 DAYS Performed at [facility], [address] [place], [place] [address]    Report Status 03/23/2017 FINAL  Final  Urine culture     Status: None   Collection Time: 03/18/17  8:08 PM  Result Value Ref Range Status   Specimen Description   Final    URINE, RANDOM Performed at [facility], [address] [place], [place] [address]    Special Requests   Final    NONE Performed at [facility], [address] [place], [place] [address]    Culture  Final    NO  GROWTH Performed at [facility], [place] [address], [place], [place] [address]    Report Status 03/20/2017 FINAL  Final  MRSA PCR Screening     Status: None   Collection Time: 03/18/17 11:50 PM  Result Value Ref Range Status   MRSA by PCR NEGATIVE NEGATIVE Final    Comment:        The GeneXpert MRSA Assay (FDA approved for NASAL specimens only), is one component of a comprehensive MRSA colonization surveillance program. It is not intended to diagnose MRSA infection nor to guide or monitor treatment for MRSA infections. Performed at [facility], [address], [place], [place] [address]     RADIOLOGY:  Dg Shoulder Right  Result Date: 04/10/2017 CLINICAL DATA:  Right shoulder pain.  Fell yesterday. EXAM: RIGHT SHOULDER - 2+ VIEW COMPARISON:  Right shoulder MRI 03/03/2017 FINDINGS: There is no evidence of acute fracture or dislocation. Mild AC joint spurring is noted. The visualized portion of the right lung is clear. IMPRESSION: No acute osseous abnormality. Electronically Signed   By: Logan Bores M.D.   On: 04/10/2017 11:23   Mr Brain Wo Contrast  Result Date: 04/10/2017 CLINICAL DATA:  Initial evaluation for acute syncope, right upper and lower with left lower extremity heaviness, tingling. EXAM: MRI HEAD WITHOUT CONTRAST MRA HEAD WITHOUT CONTRAST TECHNIQUE: Multiplanar, multiecho pulse sequences of the brain and surrounding structures were obtained without intravenous contrast. Angiographic images of the head were obtained using MRA technique without contrast. COMPARISON:  Prior CT from 04/09/2017 FINDINGS: MRI HEAD FINDINGS Brain: Generalized age-related cerebral atrophy. Patchy and confluent T2/FLAIR hyperintensity within the periventricular and deep white matter both cerebral hemispheres, most consistent with chronic small vessel ischemic disease. Multiple scatter remote lacunar infarcts present within the bilateral basal ganglia/corona radiata, as well as the  bilateral thalami. Multiple remote lacunar infarcts present within the pons. There is small focus of patchy diffusion abnormality within the subcortical white matter of the anterior right centrum semi ovale measuring 14 mm (series 100, image 35), consistent with an acute to subacute ischemic infarct. Additional mild diffusion abnormality with associated T2/FLAIR signal within the subcortical and deep white matter of the posterior left parietal region also consistent with ischemia, likely subacute in nature (series 100, image 33). No associated hemorrhage or mass effect. No other evidence for acute or subacute ischemia. Gray-white matter differentiation otherwise maintained. No evidence for acute intracranial hemorrhage. Single punctate chronic microhemorrhage noted within the posterior right centrum semi ovale. No other evidence for chronic hemorrhage. No mass lesion, midline shift or mass effect. No hydrocephalus. No extra-axial fluid collection. Major dural sinuses are grossly patent. Pituitary gland suprasellar region normal. Midline structures intact. Vascular: Major intracranial vascular flow voids maintained. Skull and upper cervical spine: Craniocervical junction normal. Bone marrow signal intensity diffusely decreased on T1 weighted imaging, most commonly related to anemia, smoking, or obesity. No scalp soft tissue abnormality. Visualized upper cervical spine unremarkable. Sinuses/Orbits: Globes and orbital soft tissues within normal limits. Scattered mucosal thickening throughout the paranasal sinuses. No mastoid effusion. Inner ear structures normal. Other: None. MRA HEAD FINDINGS ANTERIOR CIRCULATION: Distal cervical segments of the internal carotid arteries are patent with antegrade flow. Petrous segments widely patent bilaterally. Atheromatous irregularity within the cavernous/supraclinoid left ICA with mild multifocal narrowing. Cavernous and supraclinoid right ICA widely patent. Widely patent right A1  segment. Left A1 hypoplastic and/or absent, accounting for the diminutive left ICA is compared to the right. Normal anterior  communicating artery. Anterior cerebral arteries perfused to their distal aspects without flow-limiting stenosis. Patent M1 segments without stenosis. Normal MCA bifurcations. No proximal M2 occlusion. Distal MCA branches well perfused and symmetric. Distal small vessel atheromatous irregularity. POSTERIOR CIRCULATION: Vertebral arteries not well evaluated on this exam. Suggestive dominant right vertebral artery with hypoplastic left vertebral artery. Right PICA partially visualized and patent. Left PICA not evaluated. Basilar artery patent to its distal aspect without stenosis. Superior cerebral arteries patent bilaterally. Left PCA supplied via the basilar. Hypoplastic right P1 with prominent right posterior communicating artery. There are severe tandem bilateral P2 stenoses (series 102, image 7) PCAs otherwise patent to their distal aspects. Distal small vessel atheromatous irregularity. No aneurysm or vascular malformation. IMPRESSION: MRI HEAD IMPRESSION: 1. Patchy small volume acute to subacute ischemic infarcts involving the white matter of the right centrum semi ovale and posterior left parietal region as above. No associated hemorrhage or mass effect. 2. Advanced chronic microvascular ischemic disease for age with multiple remote lacunar infarcts involving the bilateral basal ganglia/corona radiata, thalami, and pons. MRA HEAD IMPRESSION: 1. Negative intracranial MRA for large vessel occlusion. No high-grade proximal or correctable stenosis identified. 2. Tandem severe left P2 stenoses. 3. Mild-to-moderate distal small vessel atheromatous irregularity. Electronically Signed   By: Jeannine Boga M.D.   On: 04/10/2017 01:43   US Carotid Bilateral (at [facility] Only)  Result Date: 04/09/2017 CLINICAL DATA:  47 year old female with a history of syncope. Cardiovascular risk  factors include hypertension, known prior stroke/TIA, hyperlipidemia, diabetes EXAM: BILATERAL CAROTID DUPLEX ULTRASOUND TECHNIQUE: Pearline Cables scale imaging, color Doppler and duplex ultrasound were performed of bilateral carotid and vertebral arteries in the neck. COMPARISON:  No prior duplex FINDINGS: Criteria: Quantification of carotid stenosis is based on velocity parameters that correlate the residual internal carotid diameter with NASCET-based stenosis levels, using the diameter of the distal internal carotid lumen as the denominator for stenosis measurement. The following velocity measurements were obtained: RIGHT ICA:  Systolic 79 cm/sec, Diastolic 29 cm/sec CCA:  70 cm/sec SYSTOLIC ICA/CCA RATIO:  1.1 ECA:  50 cm/sec LEFT ICA:  Systolic 75 cm/sec, Diastolic 27 cm/sec CCA:  355 cm/sec SYSTOLIC ICA/CCA RATIO:  0.5 ECA:  87 cm/sec Right Brachial SBP: Not acquired Left Brachial SBP: Not acquired RIGHT CAROTID ARTERY: No significant calcifications of the right common carotid artery. Intermediate waveform maintained. Heterogeneous and partially calcified plaque at the right carotid bifurcation. No significant lumen shadowing. Low resistance waveform of the right ICA. No significant tortuosity. RIGHT VERTEBRAL ARTERY: Antegrade flow with low resistance waveform. LEFT CAROTID ARTERY: No significant calcifications of the left common carotid artery. Intermediate waveform maintained. Heterogeneous and partially calcified plaque at the left carotid bifurcation without significant lumen shadowing. Low resistance waveform of the left ICA. No significant tortuosity. LEFT VERTEBRAL ARTERY:  Antegrade flow with low resistance waveform. IMPRESSION: Color duplex indicates minimal heterogeneous and calcified plaque, with no hemodynamically significant stenosis by duplex criteria in the extracranial cerebrovascular circulation. Signed, Dulcy Fanny. Earleen Newport, DO [facility]  Electronically Signed   By: Corrie Mckusick D.O.   On: 04/09/2017 16:32   Mr Jodene Nam Head/brain DD Cm  Result Date: 04/10/2017 CLINICAL DATA:  Initial evaluation for acute syncope, right upper and lower with left lower extremity heaviness, tingling. EXAM: MRI HEAD WITHOUT CONTRAST MRA HEAD WITHOUT CONTRAST TECHNIQUE: Multiplanar, multiecho pulse sequences of the brain and surrounding structures were obtained without intravenous contrast. Angiographic images of the head were obtained using MRA technique without  contrast. COMPARISON:  Prior CT from 04/09/2017 FINDINGS: MRI HEAD FINDINGS Brain: Generalized age-related cerebral atrophy. Patchy and confluent T2/FLAIR hyperintensity within the periventricular and deep white matter both cerebral hemispheres, most consistent with chronic small vessel ischemic disease. Multiple scatter remote lacunar infarcts present within the bilateral basal ganglia/corona radiata, as well as the bilateral thalami. Multiple remote lacunar infarcts present within the pons. There is small focus of patchy diffusion abnormality within the subcortical white matter of the anterior right centrum semi ovale measuring 14 mm (series 100, image 35), consistent with an acute to subacute ischemic infarct. Additional mild diffusion abnormality with associated T2/FLAIR signal within the subcortical and deep white matter of the posterior left parietal region also consistent with ischemia, likely subacute in nature (series 100, image 33). No associated hemorrhage or mass effect. No other evidence for acute or subacute ischemia. Gray-white matter differentiation otherwise maintained. No evidence for acute intracranial hemorrhage. Single punctate chronic microhemorrhage noted within the posterior right centrum semi ovale. No other evidence for chronic hemorrhage. No mass lesion, midline shift or mass effect. No hydrocephalus. No extra-axial fluid collection. Major dural sinuses are grossly patent. Pituitary  gland suprasellar region normal. Midline structures intact. Vascular: Major intracranial vascular flow voids maintained. Skull and upper cervical spine: Craniocervical junction normal. Bone marrow signal intensity diffusely decreased on T1 weighted imaging, most commonly related to anemia, smoking, or obesity. No scalp soft tissue abnormality. Visualized upper cervical spine unremarkable. Sinuses/Orbits: Globes and orbital soft tissues within normal limits. Scattered mucosal thickening throughout the paranasal sinuses. No mastoid effusion. Inner ear structures normal. Other: None. MRA HEAD FINDINGS ANTERIOR CIRCULATION: Distal cervical segments of the internal carotid arteries are patent with antegrade flow. Petrous segments widely patent bilaterally. Atheromatous irregularity within the cavernous/supraclinoid left ICA with mild multifocal narrowing. Cavernous and supraclinoid right ICA widely patent. Widely patent right A1 segment. Left A1 hypoplastic and/or absent, accounting for the diminutive left ICA is compared to the right. Normal anterior communicating artery. Anterior cerebral arteries perfused to their distal aspects without flow-limiting stenosis. Patent M1 segments without stenosis. Normal MCA bifurcations. No proximal M2 occlusion. Distal MCA branches well perfused and symmetric. Distal small vessel atheromatous irregularity. POSTERIOR CIRCULATION: Vertebral arteries not well evaluated on this exam. Suggestive dominant right vertebral artery with hypoplastic left vertebral artery. Right PICA partially visualized and patent. Left PICA not evaluated. Basilar artery patent to its distal aspect without stenosis. Superior cerebral arteries patent bilaterally. Left PCA supplied via the basilar. Hypoplastic right P1 with prominent right posterior communicating artery. There are severe tandem bilateral P2 stenoses (series 102, image 7) PCAs otherwise patent to their distal aspects. Distal small vessel  atheromatous irregularity. No aneurysm or vascular malformation. IMPRESSION: MRI HEAD IMPRESSION: 1. Patchy small volume acute to subacute ischemic infarcts involving the white matter of the right centrum semi ovale and posterior left parietal region as above. No associated hemorrhage or mass effect. 2. Advanced chronic microvascular ischemic disease for age with multiple remote lacunar infarcts involving the bilateral basal ganglia/corona radiata, thalami, and pons. MRA HEAD IMPRESSION: 1. Negative intracranial MRA for large vessel occlusion. No high-grade proximal or correctable stenosis identified. 2. Tandem severe left P2 stenoses. 3. Mild-to-moderate distal small vessel atheromatous irregularity. Electronically Signed   By: Jeannine Boga M.D.   On: 04/10/2017 01:43    EKG:   Orders placed or performed during the hospital encounter of 04/09/17  . EKG 12-Lead  . EKG 12-Lead  . ED EKG  . ED EKG  . EKG  Management plans discussed with the patient, family and they are in agreement.  CODE STATUS:     Code Status Orders  (From admission, onward)        Start     Ordered   04/09/17 1449  Do not attempt resuscitation (DNR)  Continuous    Question Answer Comment  In the event of cardiac or respiratory ARREST Do not call a "code blue"   In the event of cardiac or respiratory ARREST Do not perform Intubation, CPR, defibrillation or ACLS   In the event of cardiac or respiratory ARREST Use medication by any route, position, wound care, and other measures to relive pain and suffering. May use oxygen, suction and manual treatment of airway obstruction as needed for comfort.      04/09/17 1448    Code Status History    Date Active Date Inactive Code Status Order ID Comments User Context   03/18/2017 23:48 03/22/2017 18:44 Full Code [id number]  Henreitta Leber, MD ED   03/16/2017 15:39 03/16/2017 19:37 Full Code [id number]  Corky Mull, MD Inpatient   01/27/2017 19:34 01/29/2017  18:39 Full Code [id number]  Gorden Harms, MD Inpatient   01/07/2016 16:30 01/07/2016 20:16 Full Code [id number]  Corky Mull, MD Inpatient   12/14/2015 02:39 12/14/2015 17:33 Full Code [id number]  Harrie Foreman, MD ED   11/07/2015 15:21 11/08/2015 19:20 Full Code [id number]  Baxter Hire, MD Inpatient   10/28/2015 16:04 11/02/2015 15:04 Full Code [id number]  Henreitta Leber, MD Inpatient   06/03/2015 15:10 06/05/2015 15:01 Full Code [id number]  Fritzi Mandes, MD Inpatient   04/17/2015 21:59 04/18/2015 20:45 Full Code [id number]  Vaughan Basta, MD ED   02/15/2015 20:04 02/20/2015 19:03 Full Code [id number]  Gonzella Lex, MD Inpatient   11/12/2014 08:33 11/13/2014 14:49 Full Code [id number]  Isaias Cowman, MD Inpatient   11/07/2014 21:53 11/12/2014 08:33 Full Code [id number]  Henreitta Leber, MD Inpatient      TOTAL TIME TAKING CARE OF THIS PATIENT: 45  minutes.   Note: This dictation was prepared with Dragon dictation along with smaller phrase technology. Any transcriptional errors that result from this process are unintentional.   _0 @  on 04/13/2017 at 12:57 PM  Between 7am to 6pm - Pager - [phone]  After 6pm go to [email or web address] - password [email or web address] [facility]  Office  [phone]  CC: Primary care physician; [name], MD

## 2017-04-12 LAB — GLUCOSE, CAPILLARY
GLUCOSE-CAPILLARY: 113 mg/dL — AB (ref 65–99)
GLUCOSE-CAPILLARY: 140 mg/dL — AB (ref 65–99)
GLUCOSE-CAPILLARY: 211 mg/dL — AB (ref 65–99)
GLUCOSE-CAPILLARY: 22 mg/dL — AB (ref 65–99)
GLUCOSE-CAPILLARY: 328 mg/dL — AB (ref 65–99)
GLUCOSE-CAPILLARY: 61 mg/dL — AB (ref 65–99)
GLUCOSE-CAPILLARY: 70 mg/dL (ref 65–99)
GLUCOSE-CAPILLARY: 87 mg/dL (ref 65–99)
GLUCOSE-CAPILLARY: 96 mg/dL (ref 65–99)
Glucose-Capillary: 33 mg/dL — CL (ref 65–99)
Glucose-Capillary: 54 mg/dL — ABNORMAL LOW (ref 65–99)
Glucose-Capillary: 186 mg/dL — ABNORMAL HIGH (ref 65–99)
Glucose-Capillary: 127 mg/dL — ABNORMAL HIGH (ref 65–99)
Glucose-Capillary: 103 mg/dL — ABNORMAL HIGH (ref 65–99)
Glucose-Capillary: 99 mg/dL (ref 65–99)
Glucose-Capillary: 280 mg/dL — ABNORMAL HIGH (ref 65–99)
Glucose-Capillary: 172 mg/dL — ABNORMAL HIGH (ref 65–99)
Glucose-Capillary: 117 mg/dL — ABNORMAL HIGH (ref 65–99)
Glucose-Capillary: 115 mg/dL — ABNORMAL HIGH (ref 65–99)
Glucose-Capillary: 326 mg/dL — ABNORMAL HIGH (ref 65–99)

## 2017-04-12 NOTE — Progress Notes (Addendum)
Inpatient Diabetes Program Recommendations  AACE/ADA: New Consensus Statement on Inpatient Glycemic Control (2015)  Target Ranges:  Prepandial:   less than 140 mg/dL      Peak postprandial:   less than 180 mg/dL (1-2 hours)      Critically ill patients:  140 - 180 mg/dL   Results for [name], [name] (MRN [id number]) as of 04/12/2017 09:54  Ref. Range 04/12/2017 00:01 04/12/2017 01:00 04/12/2017 01:54 04/12/2017 02:58 04/12/2017 03:59 04/12/2017 05:02 04/12/2017 05:54 04/12/2017 06:54 04/12/2017 08:00 04/12/2017 08:57  Glucose-Capillary Latest Ref Range: 65 - 99 mg/dL 61 (L) 70 54 (L) 186 (H) 127 (H) 103 (H) 99 87 96 328 (H)  Results for [name], [name] (MRN [id number]) as of 04/12/2017 09:54  Ref. Range 04/11/2017 12:01 04/11/2017 14:22 04/11/2017 15:05 04/11/2017 16:36 04/11/2017 16:38 04/11/2017 17:12 04/11/2017 18:50 04/11/2017 19:47 04/11/2017 20:57 04/11/2017 21:53 04/11/2017 22:55  Glucose-Capillary Latest Ref Range: 65 - 99 mg/dL 70 119 (H) 73 33 (LL) 35 (LL) 99 96 63 (L) 79 59 (L) 106 (H)   Review of Glycemic Control  Diabetes history: DM2 Outpatient Diabetes medications: Glipizide 2.5 mg daily (started on 03/31/17 by PCP) Current orders for Inpatient glycemic control: Novolog 0-9 units TID with meals, Novolog 0-5 units QHS  Inpatient Diabetes Program Recommendations: Inpatient Referral: If hypoglycemia persist, recommend attending MD call Endocrinologist to discuss patient to see if they will provide recommendations. Please note that even if Endocrinologist consult is placed, there is not an Endocrinologist that is routinely coming into the hospital. In the past, Dr. Gabriel Carina has agreed to talk with the physician directly regarding specific patient issues.  Oral Agents: Would not recommend patient resume Glipizide as an outpatient. Recommend patient be referred to an Endocrinologist to assist with DM control.  Addendum 04/12/17@12 :15- Patient has DM2 hx and was taking Metformin for outpatient DM control.  Metformin was discontinued on 03/22/17 when patient was discharged from the hospital. Per chart review, noted patient's PCP started her on Glipizide 2.5 mg daily on 03/31/17. Patient is currently ordered Novolog insulin as in inpatient but she has NOT received any insulin since being admitted. Noted dextrose in IV fluids was discontinued and last hypoglycemic episode was around 1:54 am today (glucose 54 mg/dl).  Patient sitting up in chair; alert and oriented. Spoke with patient concerning recurrent hypoglycemia. Patient states that she started taking Glipizide on 03/31/17 as prescribed by her PCP and she began having hypoglycemia at home. Patient states that the day she came to the hospital she had not taken the Glipizide because she was having hypoglycemia at home.  Patient states that prior to being started on Glipizide (without any DM medications at home) her glucose was trending 150-200 mg/dl. Patient reports that she can usually recognize hypoglycemia when her glucose drops but states that over the past couple of days she has not been able to recognize that her glucose is low and that is concerning to her. Discussed hypoglycemia unawareness and informed patient that if glucose is able to be controlled within glucose goals then the body can reset itself and regain the ability to recognize hypoglycemia again. Discussed hypothyroidism can result in hypoglycemia at times and explained that her Synthroid was restarted yesterday. Patient states that she has been on the same dose of Synthroid for over 1 year and she would like to have her thyroid levels checked to be sure her thyroid medication does not need to be adjusted.  Patient states that she has never seen an Musician.  Patient states that she uses the Reli-On glucometer at home and she has everything she needs to monitor glucose.  Asked patient to be sure she checks her glucose at home frequently and to contact her PCP right away if she is noticing that  her glucose is continuing to be low. Discussed FreeStyle Libre (14 day flash glucose monitoring sensor) and encouraged patient to ask PCP about prescribing if she is interested. Explained that the [place] would help provide more information on glucose trends which will allow her to monitor it closer and will help her doctor to determine what she needs for DM control.  Also encouraged patient to ask PCP about being referred to an Endocrinologist for assistance with glycemic control. Patient verbalized understanding of information discussed and she states that she has no further questions at this time related to DM.   Thanks, Barnie Alderman, RN, MSN, CDE Diabetes Coordinator Inpatient Diabetes Program [phone] (Team Pager from 8am to 5pm)

## 2017-04-12 NOTE — Progress Notes (Signed)
[facility] NAME: [name]    MR#:  [id number]  DATE OF BIRTH:  [birth year]  SUBJECTIVE:  CHIEF COMPLAINT: Patient is feeling fine.  Accu-Chek dropped down to 17 on  3/10/19morning, d5 d/ced  REVIEW OF SYSTEMS:  CONSTITUTIONAL: No fever, fatigue or weakness.  EYES: No blurred or double vision.  EARS, NOSE, AND THROAT: No tinnitus or ear pain.  RESPIRATORY: No cough, shortness of breath, wheezing or hemoptysis.  CARDIOVASCULAR: No chest pain, orthopnea, edema.  GASTROINTESTINAL: No nausea, vomiting, diarrhea or abdominal pain.  GENITOURINARY: No dysuria, hematuria.  ENDOCRINE: No polyuria, nocturia,  HEMATOLOGY: No anemia, easy bruising or bleeding SKIN: No rash or lesion. MUSCULOSKELETAL: No joint pain or arthritis.   NEUROLOGIC: No tingling, numbness, weakness.  PSYCHIATRY: No anxiety or depression.   DRUG ALLERGIES:   Allergies  Allergen Reactions  . Tramadol     Mouth feels like it's on fire    VITALS:  Blood pressure (!) 150/74, pulse 74, temperature 98.6 F (37 C), temperature source Oral, resp. rate 20, height 5\' 2"  (1.575 m), weight 75.4 kg (166 lb 3.2 oz), SpO2 91 %.  PHYSICAL EXAMINATION:  GENERAL:  47 y.o.-year-old patient lying in the bed with no acute distress.  EYES: Pupils equal, round, reactive to light and accommodation. No scleral icterus. Extraocular muscles intact.  HEENT: Head atraumatic, normocephalic. Oropharynx and nasopharynx clear.  NECK:  Supple, no jugular venous distention. No thyroid enlargement, no tenderness.  LUNGS: Normal breath sounds bilaterally, no wheezing, rales,rhonchi or crepitation. No use of accessory muscles of respiration.  CARDIOVASCULAR: S1, S2 normal. No murmurs, rubs, or gallops.  ABDOMEN: Soft, nontender, nondistended. Bowel sounds present. No organomegaly or mass.  EXTREMITIES: No pedal edema, cyanosis, or clubbing.  NEUROLOGIC: Cranial nerves II through XII  are intact. Muscle strength 5/5 in all extremities except right upper extremity. Sensation intact. Gait not checked.  PSYCHIATRIC: The patient is alert and oriented x 3.  SKIN: No obvious rash, lesion, or ulcer.    LABORATORY PANEL:   CBC Recent Labs  Lab 04/09/17 1307  WBC 9.4  HGB 11.0*  HCT 33.8*  PLT 202   ------------------------------------------------------------------------------------------------------------------  Chemistries  Recent Labs  Lab 04/09/17 1307  NA 139  K 4.1  CL 105  CO2 25  GLUCOSE 139*  BUN 20  CREATININE 2.06*  CALCIUM 8.6*  AST 27  ALT 35  ALKPHOS 156*  BILITOT 0.5   ------------------------------------------------------------------------------------------------------------------  Cardiac Enzymes Recent Labs  Lab 04/09/17 1307  TROPONINI <0.03   ------------------------------------------------------------------------------------------------------------------  RADIOLOGY:  No results found.  EKG:   Orders placed or performed during the hospital encounter of 04/09/17  . EKG 12-Lead  . EKG 12-Lead  . ED EKG  . ED EKG  . EKG    ASSESSMENT AND PLAN:    # Syncope secondary to acute CVA and orthostatic hypotension/hypoglycemia Provided IV fluids and repeat orthostatics CT head is negative and echocardiogram was recently performed in March 19, 2017 carotid Dopplers-no significant stenosis Hydrated with IV fluids and monitor in telemetry, dizziness resolved  #Diabetes mellitus with severe hypoglycemia Pt is brittle diabetic Patient was started on D5 and weaned off today as hypoglycemia improved Continue close monitoring of BS today  OP f/u with endo after d/c, pt might be a good candidate for insulin pump   #Acute CVA CT head is negative MRI of the brain-patchy acute versus subacute infarcts ischemic  carotid Dopplers no significant stenosis Echocardiogram  was performed on March 19, 2017 which has revealed 60-65%  ejection fraction and no regional wall motion abnormalities Speech therapy elevated the patient and recommending dysphagia 3 diet  PT assessment  discharge patient with aspirin 325 mg and high intensity statin LDL 88 Neurology  recommendations appreciated  #Recent history of rt  shoulder rotator cuff surgery by Dr. [name] of the shoulder with no acute findings Patient was seen and evaluated by Dr. Marry Guan, She should continue with the sling and abduction pillow.  She is cleared for physical therapy as per the stroke rehabilitation, but she should refrain from any active range of motion or strengthening until she is reevaluated by Dr. [name].   #Chronic  kidney disease Baseline creatinine seems to be at 3.0 today patient is a 2.0 continue close monitoring and avoid nephrotoxins PCP may consider referring the patient to nephrology on outpatient basis  #History of coronary artery disease status post drug-eluting stent placement  troponin negative and patient is a symptomatic. Resume home medications once patient tolerates diet and pass a swallow evaluation  #Hypothyroidism  Synthroid   PT is recommending outpatient physical therapy  All the records are reviewed and case discussed with Care Management/Social Workerr. Management plans discussed with the patient, family and they are in agreement.  CODE STATUS: dnr  TOTAL TIME TAKING CARE OF THIS PATIENT: 36  minutes.   POSSIBLE D/C IN 1 DAYS, DEPENDING ON CLINICAL CONDITION.  Note: This dictation was prepared with Dragon dictation along with smaller phrase technology. Any transcriptional errors that result from this process are unintentional.   Nicholes Mango M.D on 04/12/2017 at 6:54 PM  Between 7am to 6pm - Pager - [phone] After 6pm go to [email or web address] - password [email or web address] [facility]  Office  [phone]  CC: Primary care physician; [name], MD

## 2017-04-13 LAB — GLUCOSE, CAPILLARY
Glucose-Capillary: 107 mg/dL — ABNORMAL HIGH (ref 65–99)
Glucose-Capillary: 169 mg/dL — ABNORMAL HIGH (ref 65–99)

## 2017-04-13 MED ORDER — PREMIER PROTEIN SHAKE: 11.0000 [oz_av] | Freq: Two times a day (BID) | ORAL | 0 refills | Status: DC

## 2017-04-13 MED ORDER — ADULT MULTIVITAMIN W/MINERALS CH: 1.0000 | ORAL_TABLET | Freq: Every day | ORAL | Status: DC

## 2017-04-13 MED ORDER — ATORVASTATIN CALCIUM 40 MG PO TABS: 40.0000 mg | ORAL_TABLET | Freq: Every day | ORAL | 0 refills | Status: DC

## 2017-04-13 MED ORDER — ASPIRIN 325 MG PO TBEC: 325.0000 mg | DELAYED_RELEASE_TABLET | Freq: Every day | ORAL | 0 refills | Status: DC

## 2017-04-13 MED ORDER — INSULIN ASPART 100 UNIT/ML ~~LOC~~ SOLN: 0.0000 [IU] | Freq: Three times a day (TID) | SUBCUTANEOUS | 11 refills | Status: DC

## 2017-04-13 NOTE — Plan of Care (Signed)
Pt /dced home.  NIH= 3. Had significant drift in L leg, weak grip, numbness/tingling in L leg.  L eye was very sensitive to light and she incorrectly identified # of fingers held up.  Pt is up and ambulatory.  Pt states that she thinks her unstable  Blood sugars were r/t them taking her off metformin b/c of kidney failure and was started on glipizide.  Her blood sugars then went low and then high.  They've been stable today.  Gerald Stabs, CN reviewed d/c instructions w/her.  She is to f/u w/ endocrinologist.  She left w/ex-husband.

## 2017-04-13 NOTE — Care Management Note (Signed)
Case Management Note  Patient Details  Name: [name] MRN: [id number] Date of Birth: 1970/09/07  Subjective/Objective:  Admitted to [facility] with the diagnosis of syncope. Lives with son, [name] [phone]). Last seen Dr. Caryn Section Thursday of last week. Prescriptions are filled at [facility] on [address] or [address]. [facility] in the home in the past. Currently getting occupational therapy at [facility]. No skilled facility. No medical equipment in the home. Takes care of all basic activities of daily living herself, drives. Fell last Friday. Lost 15-20 pounds in the last 6 weeks.Friend will transport                  Action/Plan: Physical therapy evaluation completed. Recommending outpatient therapy. Discussed letting [facility] outpatient facility know about this recommendation next visit. Med Line information given    Expected Discharge Date:  04/13/17               Expected Discharge Plan:     In-House Referral:   yes  Discharge planning Services   yes  Post Acute Care Choice:    Choice offered to:     DME Arranged:    DME Agency:     HH Arranged:    HH Agency:     Status of Service:     If discussed at [name], dates discussed:    Additional Comments:  Shelbie Ammons, RN MSN CCM Care Management [phone] 04/13/2017, 10:20 AM

## 2017-04-13 NOTE — Care Management Important Message (Signed)
Important Message  Patient Details  Name: [name] MRN: [id number] Date of Birth: 07-22-1970   Medicare Important Message Given:  Yes    Shelbie Ammons, RN 04/13/2017, 11:33 AM

## 2017-04-13 NOTE — Progress Notes (Signed)
Inpatient Diabetes Program Recommendations  AACE/ADA: New Consensus Statement on Inpatient Glycemic Control (2015)  Target Ranges:  Prepandial:   less than 140 mg/dL      Peak postprandial:   less than 180 mg/dL (1-2 hours)      Critically ill patients:  140 - 180 mg/dL   Results for ST KOOPER, [name] (MRN [id number]) as of 04/13/2017 08:01  Ref. Range 04/12/2017 06:54 04/12/2017 08:00 04/12/2017 08:57 04/12/2017 09:57 04/12/2017 11:02 04/12/2017 11:50 04/12/2017 13:02 04/12/2017 15:09 04/12/2017 17:16 04/12/2017 18:49 04/12/2017 20:47 04/13/2017 07:52  Glucose-Capillary Latest Ref Range: 65 - 99 mg/dL 87 96 328 (H) 280 (H) 140 (H) 113 (H) 172 (H) 117 (H) 115 (H) 326 (H) 211 (H) 107 (H)   Review of Glycemic Control  Diabetes history:DM2 Outpatient Diabetes medications:Glipizide 2.5 mg daily (started on 03/31/17 by PCP) Current orders for Inpatient glycemic control:Novolog 0-9 units TID with meals, Novolog 0-5 units QHS  Inpatient Diabetes Program Recommendations: Oral Agents: Would not recommend patient resume Glipizide as an outpatient. Recommend patient monitor glucose at home, see PCP this week, and be referred to an Endocrinologist to assist with DM control. Also, ask her PCP about prescribing the FreeStyle Libre (14 day wear; flash glucose monitoring sensors).  NOTE: Glucose trends are now stable without any hypoglycemia over the past 24 hours. Anticipate elevated glucose values noted above are post prandial glucose values.   Thanks, Barnie Alderman, RN, MSN, CDE Diabetes Coordinator Inpatient Diabetes Program [phone] (Team Pager from 8am to 5pm)

## 2017-04-13 NOTE — Discharge Instructions (Signed)
Follow-up with primary care physician in 5-7 days Outpatient follow-up with nephrology in 1 month  follow-up with Dr. [name] orthopedics as recommended Follow-up with neurology in 2 weeks Follow-up with endocrinology in 3-4 days or sooner as needed  [facility] Stay Proper nutrition can help your body recover from illness and injury.   Foods and beverages high in protein, vitamins, and minerals help rebuild muscle loss, promote healing, & reduce fall risk.   In addition to eating healthy foods, a nutrition shake is an easy, delicious way to get the nutrition you need during and after your hospital stay  It is recommended that you continue to drink 2 bottles per day of:      Premier Protein Also take bariatric multivitamin with minerals by mouth once daily and calcium citrate 500 mg by mouth three times daily  Tips for adding a nutrition shake into your routine: As allowed, drink one with vitamins or medications instead of water or juice Enjoy one as a tasty mid-morning or afternoon snack Drink cold or make a milkshake out of it Drink one instead of milk with cereal or snacks Use as a coffee creamer   Available at the following grocery stores and pharmacies:           * [facility] [phone]            For COUPONS visit: www.[email or web address].com/join or [email or web address]-[email or web address]   Suggested Substitutions Ensure Plus = Boost Plus = Carnation Breakfast Essentials = Boost Compact Ensure Active Clear = Boost Breeze Glucerna Shake = Boost Glucose Control = Carnation Breakfast Essentials SUGAR FREE

## 2017-04-14 ENCOUNTER — Telehealth: Payer: Self-pay

## 2017-04-14 NOTE — Telephone Encounter (Signed)
Transition Care Management Follow-Up Telephone Call   Date discharged and where: [facility] on 04/13/17.  How have you been since you were released from the hospital? Doing fine, has had a constant headache though. Pain level = 10. Pt declined light headiness, blurred vision, dizziness or n/v/d.   Any patient concerns? None.  Items Reviewed:   Meds: verified  Allergies: verified  Dietary Changes Reviewed: N/A  Functional Questionnaire:  Independent-I Dependent-D  ADLs:   Dressing- I    Eating- I   Maintaining continence- I   Transferring- I   Transportation- I   Meal Prep- I   Managing Meds- I   Confirmed importance and Date/Time of follow-up visits scheduled: 04/15/17 @ 8:40 PM   Confirmed with patient if condition worsens to call PCP or go to the Emergency Dept. Patient was given office number and encouraged to call back with questions or concerns: YES

## 2017-04-15 ENCOUNTER — Ambulatory Visit (INDEPENDENT_AMBULATORY_CARE_PROVIDER_SITE_OTHER): Payer: Medicare Other | Admitting: Family Medicine

## 2017-04-15 ENCOUNTER — Telehealth: Payer: Self-pay | Admitting: Family Medicine

## 2017-04-15 ENCOUNTER — Encounter: Payer: Self-pay | Admitting: Family Medicine

## 2017-04-15 VITALS — BP 100/70 | HR 64 | Temp 98.1°F | Resp 16

## 2017-04-15 DIAGNOSIS — R300 Dysuria: Secondary | ICD-10-CM | POA: Diagnosis not present

## 2017-04-15 DIAGNOSIS — N183 Chronic kidney disease, stage 3 unspecified: Secondary | ICD-10-CM

## 2017-04-15 DIAGNOSIS — I6329 Cerebral infarction due to unspecified occlusion or stenosis of other precerebral arteries: Secondary | ICD-10-CM

## 2017-04-15 DIAGNOSIS — I10 Essential (primary) hypertension: Secondary | ICD-10-CM

## 2017-04-15 DIAGNOSIS — E118 Type 2 diabetes mellitus with unspecified complications: Secondary | ICD-10-CM | POA: Diagnosis not present

## 2017-04-15 DIAGNOSIS — R319 Hematuria, unspecified: Secondary | ICD-10-CM

## 2017-04-15 DIAGNOSIS — R109 Unspecified abdominal pain: Secondary | ICD-10-CM | POA: Diagnosis not present

## 2017-04-15 DIAGNOSIS — R55 Syncope and collapse: Secondary | ICD-10-CM

## 2017-04-15 DIAGNOSIS — N390 Urinary tract infection, site not specified: Secondary | ICD-10-CM | POA: Diagnosis not present

## 2017-04-15 LAB — POCT URINALYSIS DIPSTICK
Bilirubin, UA: NEGATIVE
Glucose, UA: NEGATIVE
KETONES UA: NEGATIVE
NITRITE UA: POSITIVE
PH UA: 6 (ref 5.0–8.0)
PROTEIN UA: NEGATIVE
Spec Grav, UA: 1.015 (ref 1.010–1.025)
UROBILINOGEN UA: 0.2 U/dL

## 2017-04-15 MED ORDER — METOPROLOL SUCCINATE ER 25 MG PO TB24: 12.5000 mg | ORAL_TABLET | Freq: Every day | ORAL | 1 refills | Status: DC

## 2017-04-15 MED ORDER — LOSARTAN POTASSIUM 100 MG PO TABS: 50.0000 mg | ORAL_TABLET | Freq: Every day | ORAL | 1 refills | Status: DC

## 2017-04-15 MED ORDER — GABAPENTIN 300 MG PO CAPS: 300.0000 mg | ORAL_CAPSULE | Freq: Two times a day (BID) | ORAL | 5 refills | Status: DC

## 2017-04-15 MED ORDER — INSULIN LISPRO 100 UNIT/ML (KWIKPEN): PEN_INJECTOR | SUBCUTANEOUS | 0 refills | Status: DC

## 2017-04-15 MED ORDER — AMOXICILLIN 500 MG PO CAPS: 1000.0000 mg | ORAL_CAPSULE | Freq: Two times a day (BID) | ORAL | 0 refills | Status: DC

## 2017-04-15 MED ORDER — AMOXICILLIN 500 MG PO CAPS: 1000.0000 mg | ORAL_CAPSULE | Freq: Two times a day (BID) | ORAL | 0 refills | Status: AC

## 2017-04-15 NOTE — Telephone Encounter (Signed)
I called patient and verified with her that she is taking Metoprolol Succinate  ER 25mg  once a day and Losartan 100mg  once a day. She also reminded me that she needs a refill on Gabapentin.

## 2017-04-15 NOTE — Patient Instructions (Addendum)
   Check blood sugar before each meal      If blood sugar is >=180, but <250, take 4 units Humalog insulin before eating     If blood sugar is >=250, but <300, take 6 units Humalog insulin     If blood sugar is >=300, but <350, take 8 units Humalog insulin     If blood sugar is >=350, but <400, take 10 units Humalog insulin     If blood sugar is >400, take 12 units Humalog insulin   Check your blood sugar once a day. Call by office if the top blood pressure number is <105 or >160

## 2017-04-15 NOTE — Telephone Encounter (Signed)
Patient was here this morning and I forgot to tell her to call back with dose of losartan that she has been taking, and to double check and see if she is still taking metoprolol. It looks like hospital discontinued both of those medications but I think she is still taking them.

## 2017-04-15 NOTE — Telephone Encounter (Signed)
Please review. Thanks!  

## 2017-04-15 NOTE — Telephone Encounter (Signed)
[facility] needs clarification on directions of Amoxicillin. Pt was only given 28 pills which is enough for 7 days but RX reads to take for 14 days.

## 2017-04-15 NOTE — Progress Notes (Signed)
Patient: [name] Female    DOB: 10-19-1970   47 y.o.   MRN: [id number] Visit Date: 04/15/2017  Today's Provider: [name], MD   Chief Complaint  Patient presents with  . Hospitalization Follow-up   Subjective:    HPI  Follow up Hospitalization  Patient was admitted to [facility] on 04/09/2017 and discharged on 04/13/2017. She was treated for syncope, orthostasis and CVA MRI of the brain-patchy acute versus subacute infarcts ischemic carotid Dopplers no significant stenosis Treatment for this includedHydrated with IV fluids and monitor in telemetry. Telephone follow up was done on 04/14/2017 She reports fair compliance with treatment. Patient has not been taking Lipitor or Novolog. She states the Lipitor caused severe muscle aches in her legs.  She reports this condition is Improved.  She was previously hospitalized 2/14 through 2/18 for altered mental status secondary to medications, renal failure and pneumonia. She was taken off of losartan and metformin due to acute kidney injury. She was back to her baseline mental status when seen here on 03/31/2017, but was noted to have BP of 178/100 and home blood sugars were running around 200. Lorsartan was resumed and low dose of glipizide was prescribed, but she states she had not take glipizide for at least two days before the date of her admission on 04/10/2017.  She was discharged this time with a prescription for [facility] which she has not filled. She has resumed losartan and states she has not had any dizziness or light headedness since discharge. Is eating and drinking well. Home blood sugars are still in the upper 100s and lower 200s. She is not having to take any more pain medication for her shoulder and is scheduled for weakly physical therapy.  ------------------------------------------------------------------------------------       Allergies  Allergen Reactions  . Lipitor [Atorvastatin]     Leg pains  .  Tramadol     Mouth feels like it's on fire     Current Outpatient Medications:  .  acetaminophen (TYLENOL) 325 MG tablet, Take 2 tablets (650 mg total) by mouth every 6 (six) hours as needed for mild pain (or Fever >/= 101)., Disp: , Rfl:  .  ALPRAZolam (XANAX) 0.5 MG tablet, Take 0.5-1 tablets (0.25-0.5 mg total) by mouth every 6 (six) hours as needed for anxiety., Disp: 60 tablet, Rfl: 5 .  aspirin EC 325 MG EC tablet, Take 1 tablet (325 mg total) by mouth daily., Disp: 30 tablet, Rfl: 0 .  Blood Glucose Monitoring Suppl (ONE TOUCH ULTRA 2) w/Device KIT, Use to check blood sugar once a day. Dx. E11.9, Disp: 1 each, Rfl: 0 .  buPROPion (WELLBUTRIN XL) 150 MG 24 hr tablet, Take 1 tablet (150 mg total) by mouth daily., Disp: 30 tablet, Rfl: 1 .  citalopram (CELEXA) 40 MG tablet, Take 1 tablet (40 mg total) by mouth daily., Disp: 30 tablet, Rfl: 11 .  diltiazem (CARDIZEM CD) 120 MG 24 hr capsule, Take 1 capsule (120 mg total) by mouth daily., Disp: 90 capsule, Rfl: 3 .  diphenhydramine-acetaminophen (TYLENOL PM) 25-500 MG TABS tablet, Take 1 tablet by mouth at bedtime as needed., Disp: , Rfl:  .  ezetimibe (ZETIA) 10 MG tablet, Take 1 tablet (10 mg total) by mouth at bedtime., Disp: , Rfl:  .  gabapentin (NEURONTIN) 300 MG capsule, Take 1 capsule (300 mg total) by mouth 2 (two) times daily., Disp: 30 capsule, Rfl: 0 .  hyoscyamine (LEVSIN SL) 0.125 MG SL  tablet, Place 1 tablet (0.125 mg total) under the tongue every 6 (six) hours as needed. (Patient taking differently: Place 0.125 mg under the tongue every 6 (six) hours as needed for cramping. ), Disp: 60 tablet, Rfl: 0 .  levothyroxine (SYNTHROID, LEVOTHROID) 25 MCG tablet, Take 1 tablet (25 mcg total) daily before breakfast by mouth., Disp: 30 tablet, Rfl: 12 .  Multiple Vitamin (MULTIVITAMIN WITH MINERALS) TABS tablet, Take 1 tablet by mouth daily., Disp: , Rfl:  .  nitroGLYCERIN (NITROSTAT) 0.4 MG SL tablet, Place 1 tablet (0.4 mg total) under  the tongue every 5 (five) minutes as needed for chest pain., Disp: 25 tablet, Rfl: 6 .  oxyCODONE (OXY IR/ROXICODONE) 5 MG immediate release tablet, Take 0.5 tablets (2.5 mg total) by mouth every 6 (six) hours as needed for moderate pain or severe pain., Disp: 20 tablet, Rfl: 0 .  pantoprazole (PROTONIX) 40 MG tablet, Take 1 tablet (40 mg total) by mouth 2 (two) times daily., Disp: 60 tablet, Rfl: 5 .  protein supplement shake (PREMIER PROTEIN) LIQD, Take 325 mLs (11 oz total) by mouth 2 (two) times daily between meals., Disp: 60 Can, Rfl: 0 .  atorvastatin (LIPITOR) 40 MG tablet, Take 1 tablet (40 mg total) by mouth daily at 6 PM. (Patient not taking: Reported on 04/14/2017), Disp: 30 tablet, Rfl: 0 .  insulin aspart (NOVOLOG) 100 UNIT/ML injection, Inject 0-9 Units into the skin 3 (three) times daily with meals. CBG < 70: implement hypoglycemia protocol CBG 70 - 120: 0 units CBG 121 - 150: 1 unit CBG 151 - 200: 2 units CBG 201 - 250: 3 units CBG 251 - 300: 5 units CBG 301 - 350: 7 units CBG 351 - 400: 9 units CBG > 400: call MD and obtain STAT lab verification (Patient not taking: Reported on 04/14/2017), Disp: 10 mL, Rfl: 11  Losartan 144m table, Take 1 tablet daily  Metoprolol ER 259m Take 1 tablet daily  Review of Systems  Constitutional: Positive for fatigue. Negative for appetite change, chills and fever.  Respiratory: Negative for chest tightness and shortness of breath.   Cardiovascular: Negative for chest pain and palpitations.  Gastrointestinal: Negative for abdominal pain, nausea and vomiting.  Genitourinary: Positive for dysuria.       Has pain when urinating  Neurological: Positive for weakness. Negative for dizziness.    Social History   Tobacco Use  . Smoking status: Former Smoker    Types: Cigarettes    Last attempt to quit: 08/31/1994    Years since quitting: 22.6  . Smokeless tobacco: Never Used  . Tobacco comment: quit 28 years ago  Substance Use Topics  . Alcohol  use: No    Alcohol/week: 0.0 oz   Objective:   BP 100/70 (BP Location: Left Arm, Patient Position: Sitting, Cuff Size: Normal)   Pulse 64   Temp 98.1 F (36.7 C) (Other (Comment))   Resp 16   SpO2 99% Comment: room air    Physical Exam   General Appearance:    Alert, cooperative, no distress  Eyes:    PERRL, conjunctiva/corneas clear, EOM's intact       Lungs:     Clear to auscultation bilaterally, respirations unlabored  Heart:    Regular rate and rhythm  Neurologic:   Awake, alert, oriented x 3. No apparent focal neurological           defect.       Results for orders placed or performed in visit on 04/15/17  POCT Urinalysis Dipstick  Result Value Ref Range   Color, UA yellow    Clarity, UA cloudy    Glucose, UA negative    Bilirubin, UA negative    Ketones, UA negative    Spec Grav, UA 1.015 1.010 - 1.025   Blood, UA Trace (non-hemolyzed)    pH, UA 6.0 5.0 - 8.0   Protein, UA negative    Urobilinogen, UA 0.2 0.2 or 1.0 E.U./dL   Nitrite, UA Positive    Leukocytes, UA Moderate (2+) (A) Negative   Appearance cloudy    Odor         Assessment & Plan:     1. Dysuria  - POCT Urinalysis Dipstick  2. Urinary tract infection with hematuria, site unspecified Amoxicillin 520m two twice a day for 7 days.  - Urine Culture  3. Essential hypertension Very labile blood pressure. Was hypotensive at recent admission. Was instructed to d/c losartan and metoprolol at discharge, but she has resumed both. Is to decrease both to 1/2 tablet a day for the time being.   4. Type 2 diabetes mellitus with complication, without long-term current use of insulin (HCC) Labile blood sugars. She had stopped glipizide a few days before her recent hospitalization. Had been well controlled with either metformin or Invokana, but off both due to CKD. For now will stay off glipizide and just due SSI. Given samples Humalog and instructions for SSI today.   5. Abdominal pain, unspecified  abdominal location Likely due to UTI.   6. Cerebrovascular accident (CVA) due to stenosis of other precerebral artery (HCC) Asymptomatic sunce d/c  7. Syncope, unspecified syncope type Symptoms completely resolved, but is still mildly hypotensive. Reduce BP medications as above.   8. CKD (chronic kidney disease) stage 3, GFR 30-59 ml/min (HCC)  Return in about 3 weeks (around 05/06/2017).        DLelon Huh MD  [facility]

## 2017-04-15 NOTE — Telephone Encounter (Signed)
Patient was advised. Expressed understanding.  

## 2017-04-15 NOTE — Telephone Encounter (Signed)
Meant to be for 7 days. Have sent new prescription with clarification.

## 2017-04-15 NOTE — Telephone Encounter (Addendum)
Please have her reduce metoprolol and losartan to 1/2 tablet daily of each medication for now. If her BP gets above 160 she can take the other half.

## 2017-04-16 DIAGNOSIS — R5382 Chronic fatigue, unspecified: Secondary | ICD-10-CM | POA: Diagnosis not present

## 2017-04-16 DIAGNOSIS — I639 Cerebral infarction, unspecified: Secondary | ICD-10-CM | POA: Diagnosis not present

## 2017-04-16 DIAGNOSIS — R296 Repeated falls: Secondary | ICD-10-CM | POA: Diagnosis not present

## 2017-04-16 DIAGNOSIS — R9082 White matter disease, unspecified: Secondary | ICD-10-CM | POA: Diagnosis not present

## 2017-04-16 DIAGNOSIS — F419 Anxiety disorder, unspecified: Secondary | ICD-10-CM | POA: Diagnosis not present

## 2017-04-16 DIAGNOSIS — F329 Major depressive disorder, single episode, unspecified: Secondary | ICD-10-CM | POA: Diagnosis not present

## 2017-04-16 DIAGNOSIS — F5104 Psychophysiologic insomnia: Secondary | ICD-10-CM | POA: Diagnosis not present

## 2017-04-16 DIAGNOSIS — G43019 Migraine without aura, intractable, without status migrainosus: Secondary | ICD-10-CM | POA: Diagnosis not present

## 2017-04-16 DIAGNOSIS — R0683 Snoring: Secondary | ICD-10-CM | POA: Diagnosis not present

## 2017-04-17 LAB — URINE CULTURE

## 2017-04-26 ENCOUNTER — Encounter: Admission: RE | Payer: Self-pay | Source: Ambulatory Visit

## 2017-04-26 ENCOUNTER — Ambulatory Visit
Admission: RE | Admit: 2017-04-26 | Payer: Medicare Other | Source: Ambulatory Visit | Admitting: Unknown Physician Specialty

## 2017-04-26 SURGERY — EGD (ESOPHAGOGASTRODUODENOSCOPY)
Anesthesia: General

## 2017-04-27 DIAGNOSIS — E1142 Type 2 diabetes mellitus with diabetic polyneuropathy: Secondary | ICD-10-CM | POA: Diagnosis not present

## 2017-04-27 DIAGNOSIS — I10 Essential (primary) hypertension: Secondary | ICD-10-CM | POA: Diagnosis not present

## 2017-04-27 DIAGNOSIS — E782 Mixed hyperlipidemia: Secondary | ICD-10-CM | POA: Diagnosis not present

## 2017-04-27 DIAGNOSIS — N179 Acute kidney failure, unspecified: Secondary | ICD-10-CM | POA: Diagnosis not present

## 2017-05-04 ENCOUNTER — Ambulatory Visit: Payer: Medicare Other | Attending: Neurology

## 2017-05-04 ENCOUNTER — Other Ambulatory Visit: Payer: Self-pay | Admitting: Family Medicine

## 2017-05-04 DIAGNOSIS — G4733 Obstructive sleep apnea (adult) (pediatric): Secondary | ICD-10-CM | POA: Diagnosis not present

## 2017-05-04 DIAGNOSIS — G4730 Sleep apnea, unspecified: Secondary | ICD-10-CM | POA: Diagnosis not present

## 2017-05-04 MED ORDER — METOPROLOL SUCCINATE ER 25 MG PO TB24: 12.5000 mg | ORAL_TABLET | Freq: Every day | ORAL | 5 refills | Status: DC

## 2017-05-04 NOTE — Telephone Encounter (Signed)
Pt called needing refill on her   Metoprolol 25mg   She uses [address]  Pt's call back is [phone]  Thanks Con Memos

## 2017-05-06 ENCOUNTER — Ambulatory Visit: Payer: Self-pay | Admitting: Family Medicine

## 2017-05-06 IMAGING — MR MR SHOULDER*R* W/O CM
5 series · 40 of 40 positions shown · non-contrast
Comparison: None.

CLINICAL DATA: No known injury. Weakness and decreased range of
motion.

EXAM:
MRI OF THE RIGHT SHOULDER WITHOUT CONTRAST
TECHNIQUE: Multiplanar, multisequence MR imaging of the shoulder was performed.
No intravenous contrast was administered.

[Series 3: T2 fat-sat · axial · 4.0mm · 0.55mm/px · z∈[-26,+70]mm · 10 of 23 slices shown (1 of 3)]
[im 1/23]
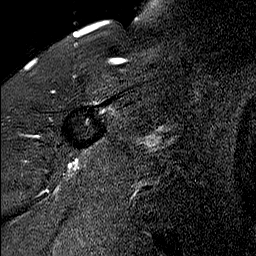
[im 3/23]
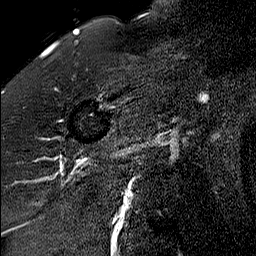
[im 5/23]
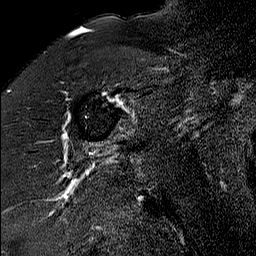
[im 8/23]
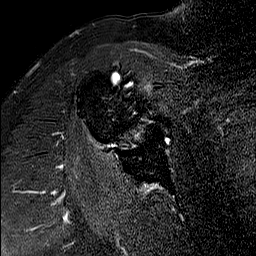
[im 10/23]
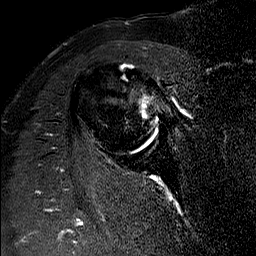
[im 13/23]
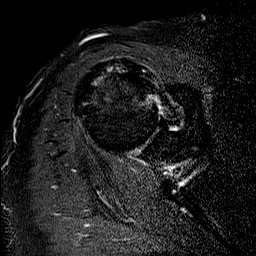
[im 15/23]
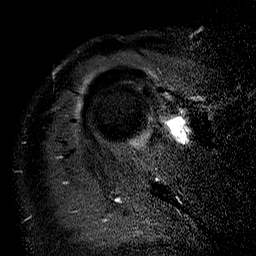
[im 18/23]
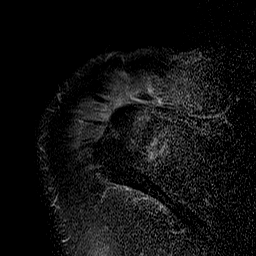
[im 20/23]
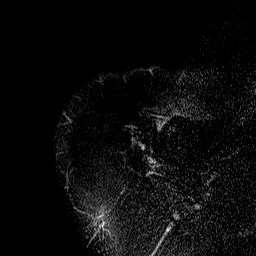
[im 23/23]
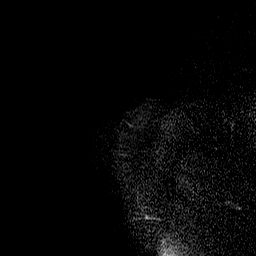

[Series 5: PD · oblique · 4.0mm · 0.59mm/px · 7 of 18 slices shown]
[im 1/18]
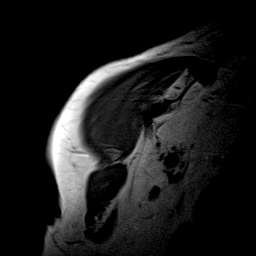
[im 3/18]
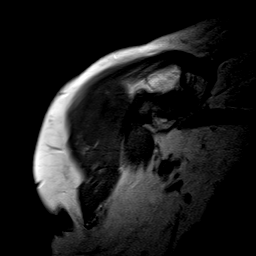
[im 6/18]
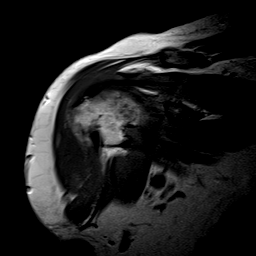
[im 9/18]
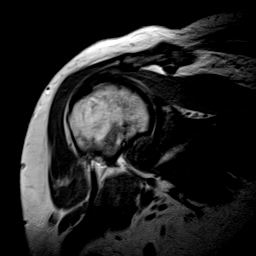
[im 12/18]
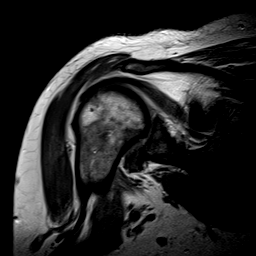
[im 15/18]
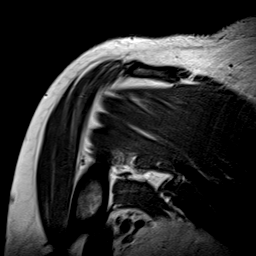
[im 18/18]
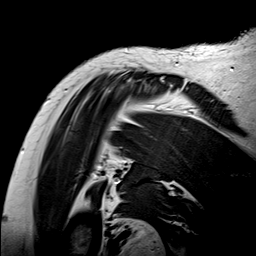

[Series 6: T1 · oblique · 4.0mm · 0.55mm/px · 8 of 20 slices shown]
[im 1/20]
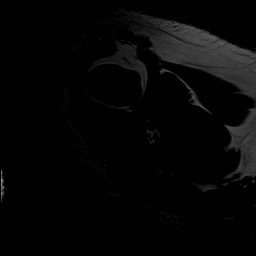
[im 3/20]
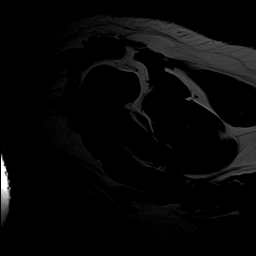
[im 6/20]
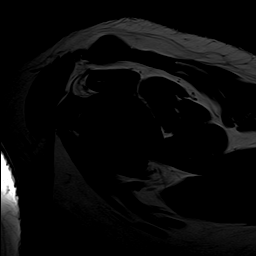
[im 9/20]
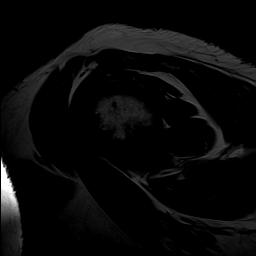
[im 11/20]
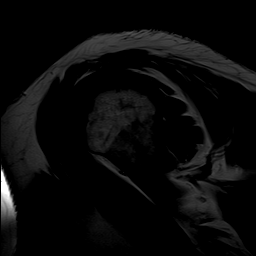
[im 14/20]
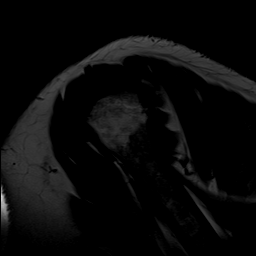
[im 17/20]
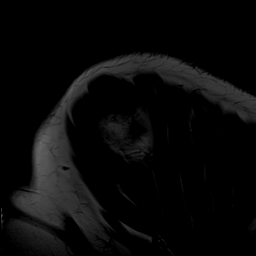
[im 20/20]
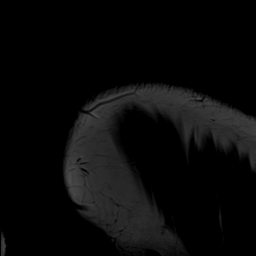

[Series 7: T2 fat-sat · oblique · 4.0mm · 0.55mm/px · 8 of 20 slices shown (2 of 3)]
[im 1/20]
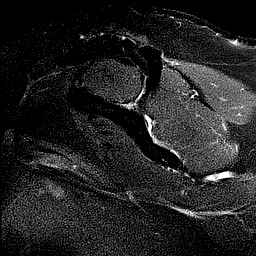
[im 3/20]
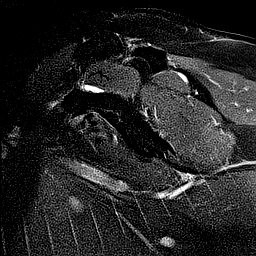
[im 6/20]
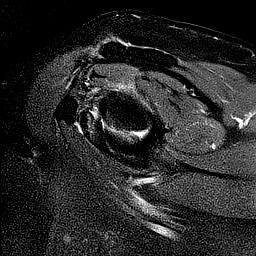
[im 9/20]
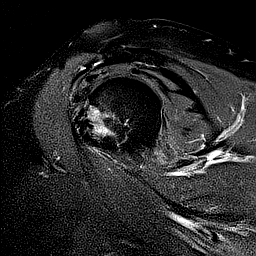
[im 11/20]
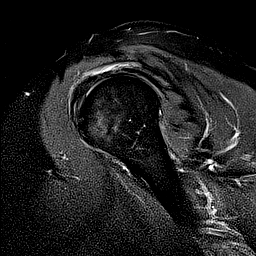
[im 14/20]
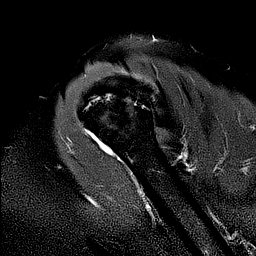
[im 17/20]
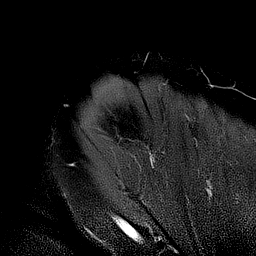
[im 20/20]
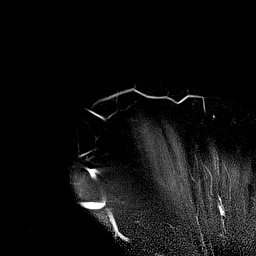

[Series 8: T2 fat-sat · oblique · 4.0mm · 0.62mm/px · 7 of 18 slices shown (3 of 3)]
[im 1/18]
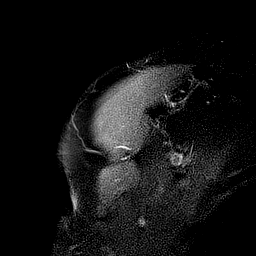
[im 3/18]
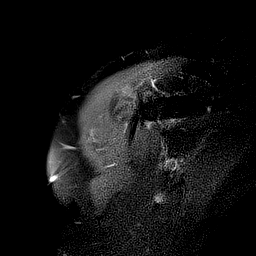
[im 6/18]
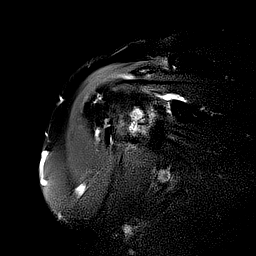
[im 9/18]
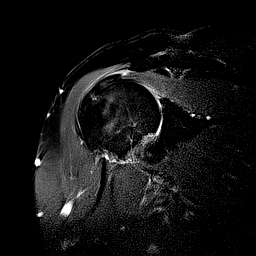
[im 12/18]
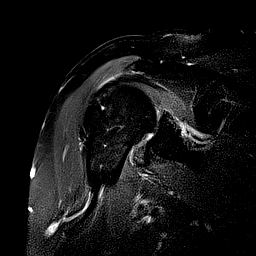
[im 15/18]
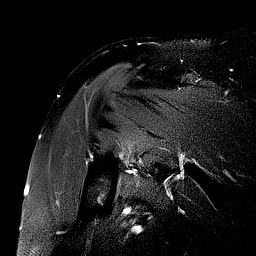
[im 18/18]
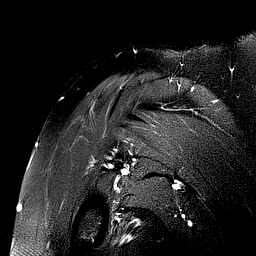

[40 of 40 positions shown; findings below may reference images not displayed]

FINDINGS: Rotator cuff: Severe tendinosis of the supraspinatus tendon with a
insertional interstitial tear with possible bursal surface
extension. Infraspinatus tendon is intact. Teres minor tendon is
intact. Subscapularis tendon is intact.

Muscles: No atrophy or fatty replacement of nor abnormal signal
within, the muscles of the rotator cuff.

Biceps long head: Mild tendinosis of the intraarticular portion of
the long head of the biceps tendon.

Acromioclavicular Joint: Mild degenerative changes of the
acromioclavicular joint. Type I acromion. No subacromial/ subdeltoid
bursal fluid.

Glenohumeral Joint: No joint effusion. Thickening of the inferior
joint capsule. Mild effacement of the normal subcoracoid fat,
replaced with intermediate signal soft tissue.

Labrum: Grossly intact, but evaluation is limited by lack of
intraarticular fluid.

Bones: Subcortical reactive marrow changes in the anteromedial
humeral head. No acute fracture or dislocation.
IMPRESSION: 1. Severe tendinosis of the supraspinatus tendon with a insertional
interstitial tear with possible bursal surface extension.
2. Mild tendinosis of the intraarticular portion of the long head of
the biceps tendon.
3. Thickening of the inferior joint capsule as can be seen with
adhesive capsulitis.

## 2017-05-13 DIAGNOSIS — N2581 Secondary hyperparathyroidism of renal origin: Secondary | ICD-10-CM | POA: Diagnosis not present

## 2017-05-13 DIAGNOSIS — D631 Anemia in chronic kidney disease: Secondary | ICD-10-CM | POA: Diagnosis not present

## 2017-05-13 DIAGNOSIS — E1122 Type 2 diabetes mellitus with diabetic chronic kidney disease: Secondary | ICD-10-CM | POA: Diagnosis not present

## 2017-05-13 DIAGNOSIS — I10 Essential (primary) hypertension: Secondary | ICD-10-CM | POA: Diagnosis not present

## 2017-05-13 DIAGNOSIS — N183 Chronic kidney disease, stage 3 (moderate): Secondary | ICD-10-CM | POA: Diagnosis not present

## 2017-05-13 IMAGING — CT CT ANGIO CHEST
2 of 6 series · 19 of 36 positions shown · IV contrast (APPLIED)
Comparison: 03/26/2014

CLINICAL DATA: Shortness of breath and back pain for 2 days,
concern for dissection

EXAM:
CT ANGIOGRAPHY CHEST WITH CONTRAST
TECHNIQUE: Multidetector CT imaging of the chest was performed using the
standard protocol during bolus administration of intravenous
contrast. Multiplanar CT image reconstructions and MIPs were
obtained to evaluate the vascular anatomy.
CONTRAST:  80 mL Isovue 370

[Series 6: arterial · axial · arterial · 0.62mm/px · z∈[-832,-608]mm · 18 of 126 slices shown]
[im 7/126  lung]
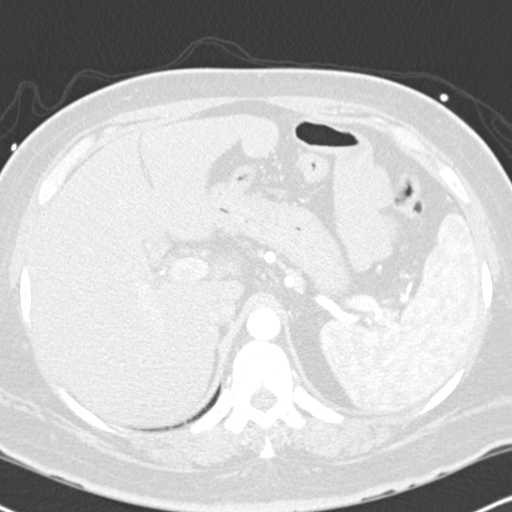
[im 14/126  mediastinal]
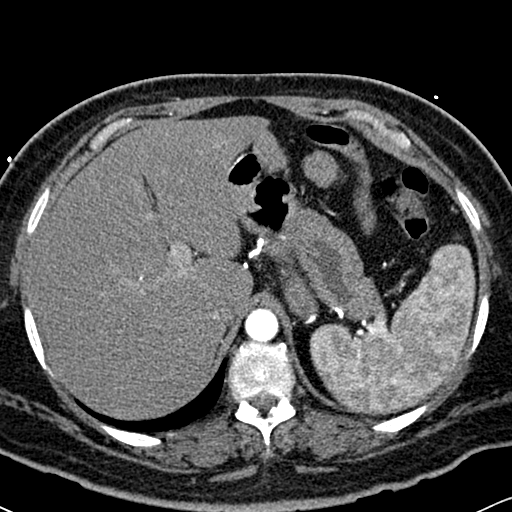
[im 20/126  lung]
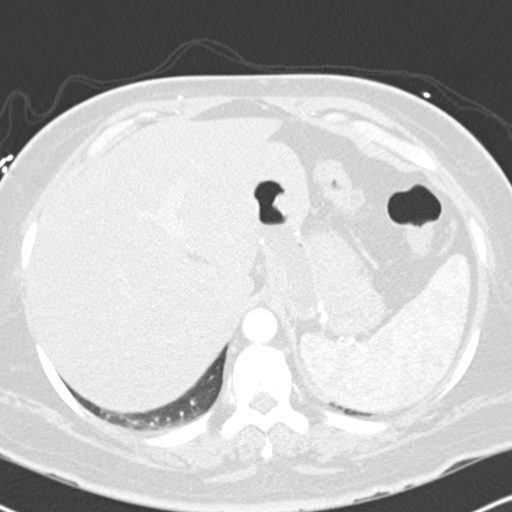
[im 27/126  mediastinal]
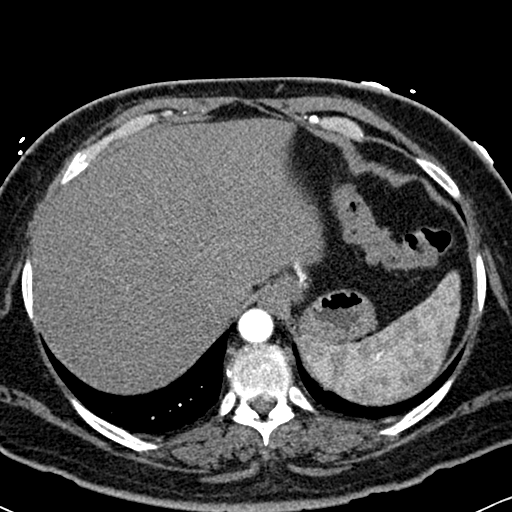
[im 33/126  lung]
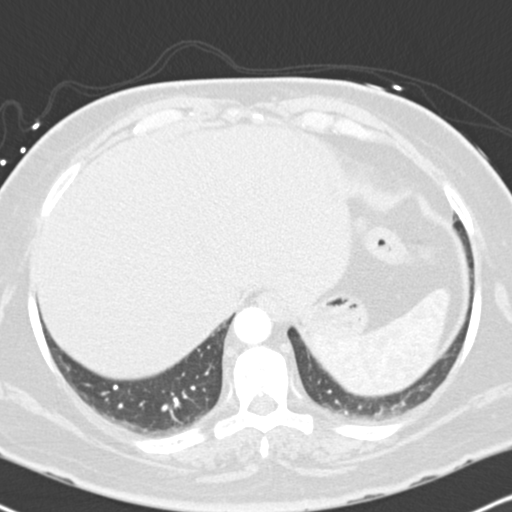
[im 40/126  mediastinal]
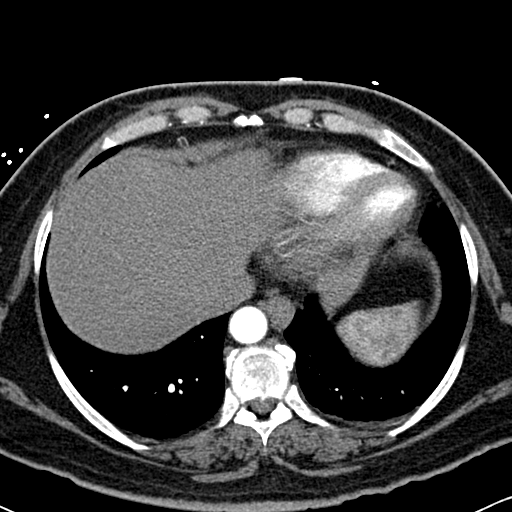
[im 47/126  lung]
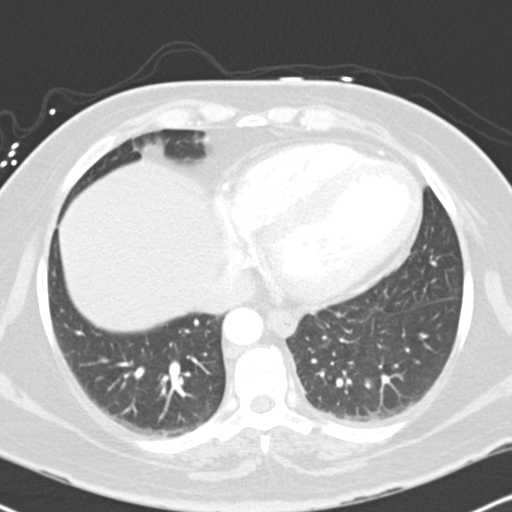
[im 53/126  mediastinal]
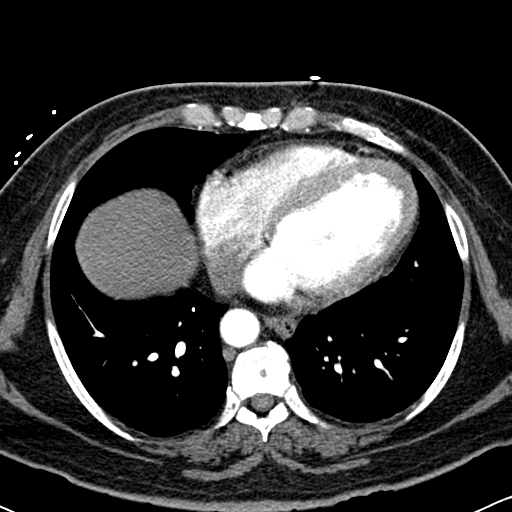
[im 60/126  lung]
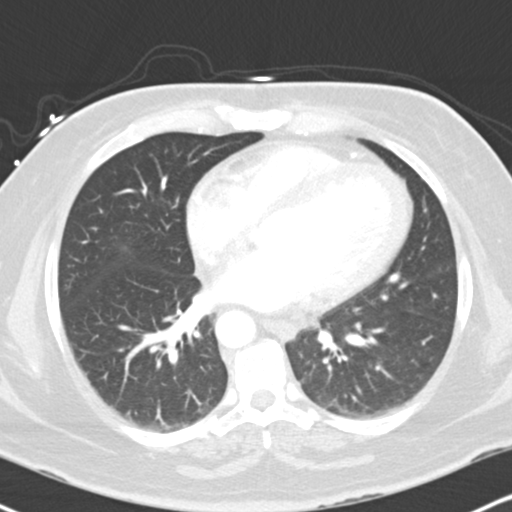
[im 66/126  mediastinal]
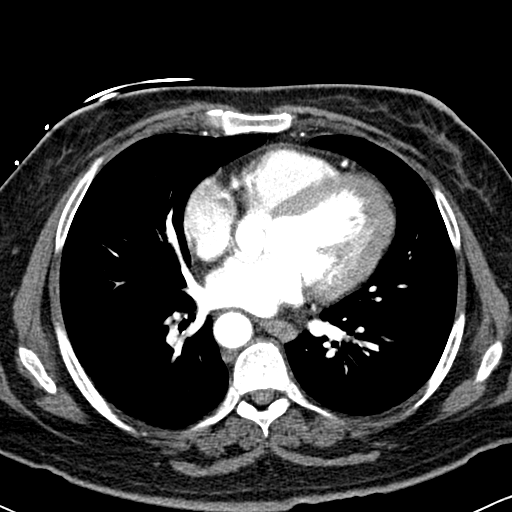
[im 73/126  lung]
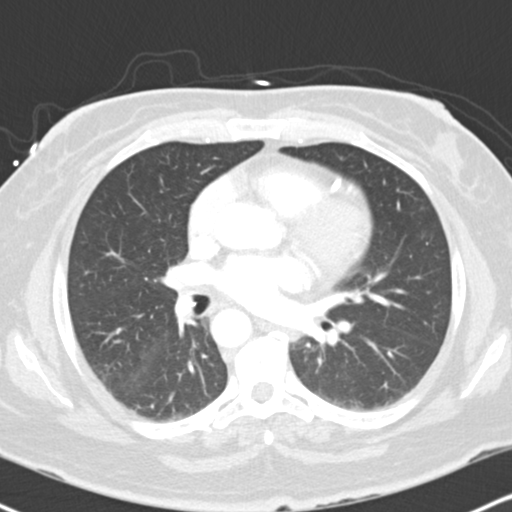
[im 79/126  mediastinal]
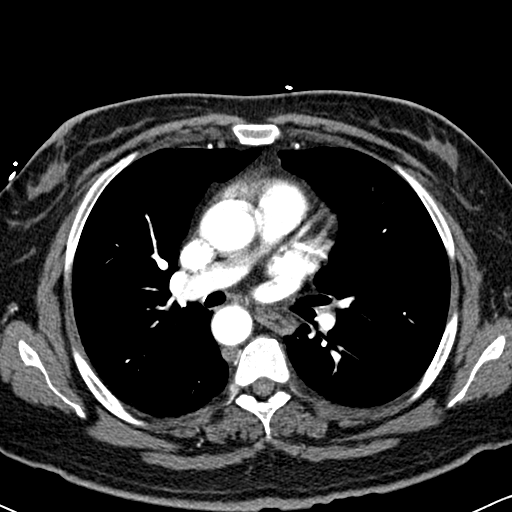
[im 86/126  lung]
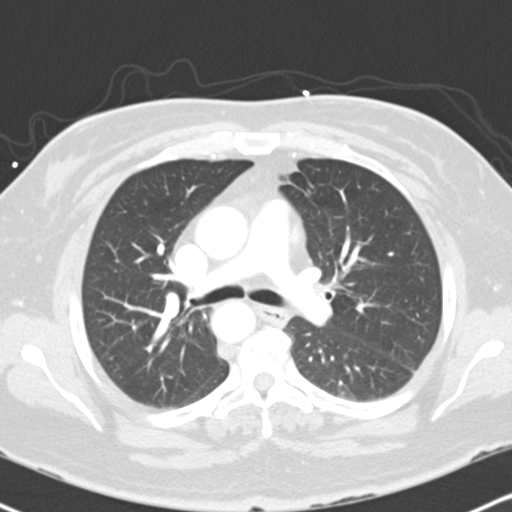
[im 93/126  mediastinal]
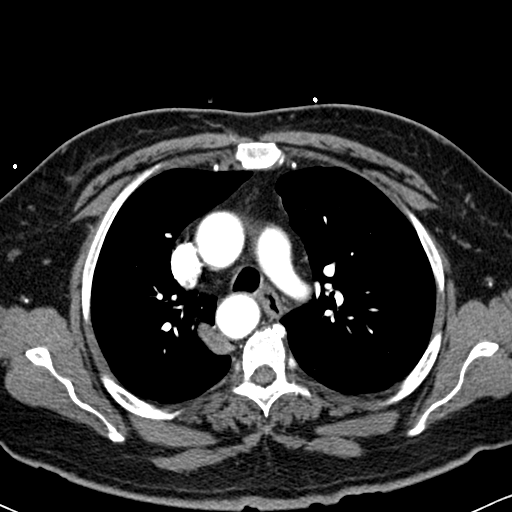
[im 99/126  lung]
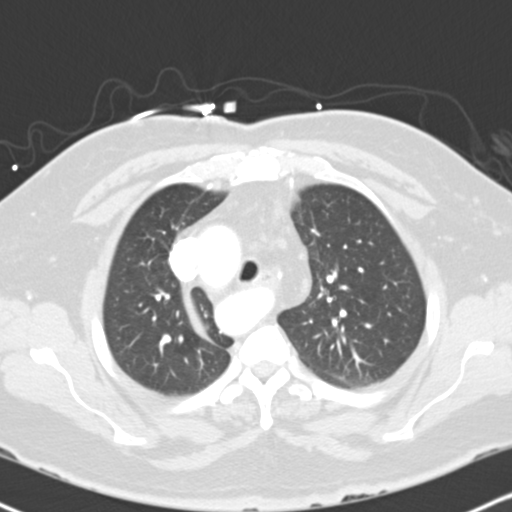
[im 106/126  mediastinal]
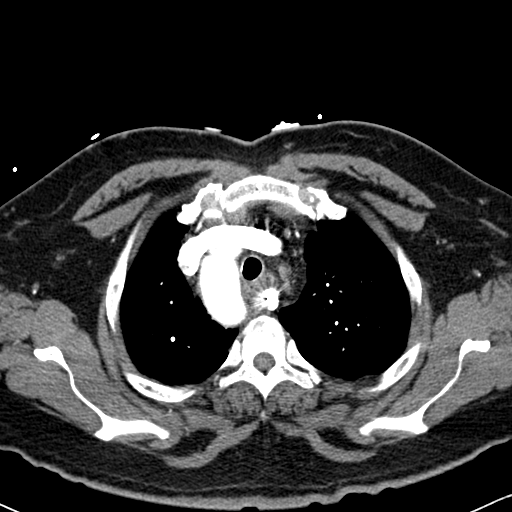
[im 112/126  lung]
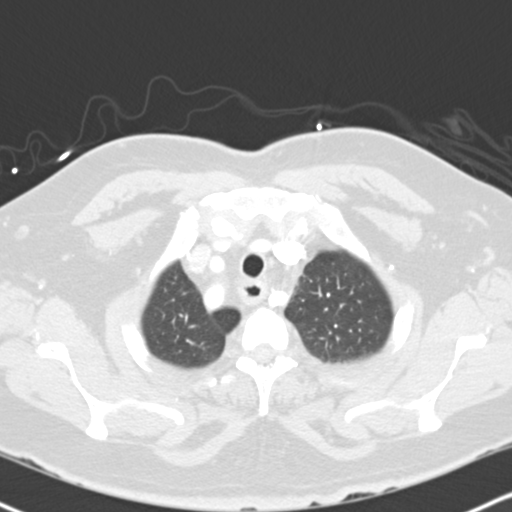
[im 119/126  mediastinal]
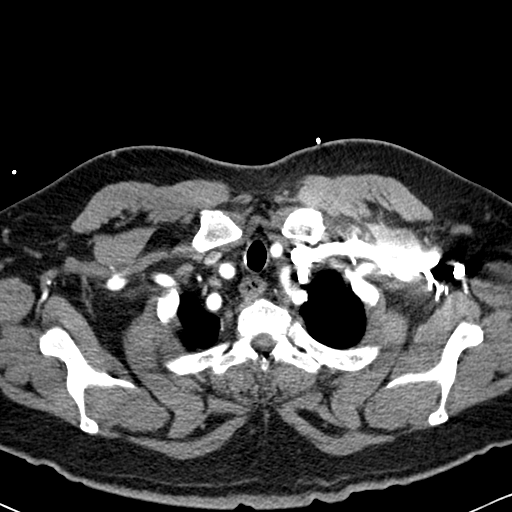

[Series 8: cor arterial mpr · coronal · arterial · 0.50mm/px · 1 of 136 slices shown]
[im 68/136  mediastinal]
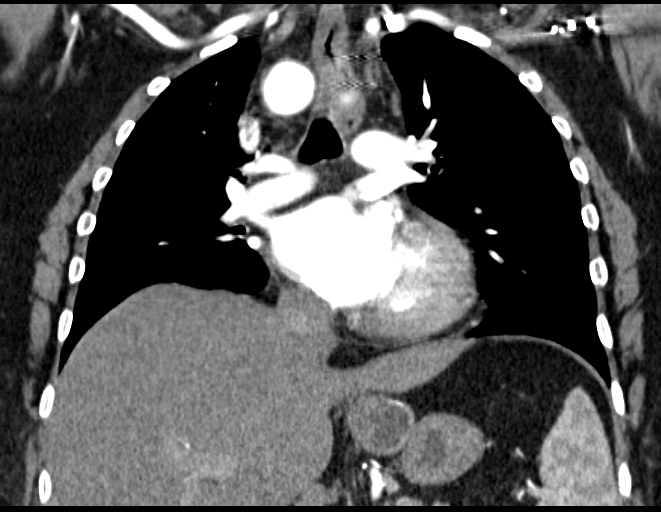

[19 of 36 positions shown; findings below may reference images not displayed]

FINDINGS: Right-sided aortic arch again identified with closure of left
subclavian artery. Right subclavian right carotid left carotid
arteries are patent. Possible bypass from left common carotid to
left subclavian. No evidence of aortic dissection. Pulmonary
arteries not the target of this examination but central pulmonary
arteries grossly negative.

Mild cardiac enlargement. No pericardial effusion. Lungs are clear
with no pleural effusion.

Aberrant subclavian artery and closure device again exerts mass
effect on the esophagus which is situated between the adherent
closed artery and the trachea.

No acute musculoskeletal findings. No acute abnormalities in the
upper abdomen.

Review of the MIP images confirms the above findings.
IMPRESSION: No evidence of aortic dissection.

Right aortic arch with left a variant subclavian artery, closure,
and bypass from left common carotid. Similar appearance to prior
study.

## 2017-05-27 DIAGNOSIS — F419 Anxiety disorder, unspecified: Secondary | ICD-10-CM | POA: Diagnosis not present

## 2017-05-27 DIAGNOSIS — R296 Repeated falls: Secondary | ICD-10-CM | POA: Diagnosis not present

## 2017-05-27 DIAGNOSIS — G63 Polyneuropathy in diseases classified elsewhere: Secondary | ICD-10-CM | POA: Diagnosis not present

## 2017-05-27 DIAGNOSIS — F5104 Psychophysiologic insomnia: Secondary | ICD-10-CM | POA: Diagnosis not present

## 2017-05-27 DIAGNOSIS — I639 Cerebral infarction, unspecified: Secondary | ICD-10-CM | POA: Diagnosis not present

## 2017-05-27 DIAGNOSIS — F329 Major depressive disorder, single episode, unspecified: Secondary | ICD-10-CM | POA: Diagnosis not present

## 2017-05-27 DIAGNOSIS — G43019 Migraine without aura, intractable, without status migrainosus: Secondary | ICD-10-CM | POA: Diagnosis not present

## 2017-05-27 DIAGNOSIS — R0683 Snoring: Secondary | ICD-10-CM | POA: Diagnosis not present

## 2017-05-31 ENCOUNTER — Encounter: Payer: Self-pay | Admitting: Cardiovascular Disease

## 2017-06-01 ENCOUNTER — Telehealth: Payer: Self-pay | Admitting: Family Medicine

## 2017-06-01 ENCOUNTER — Other Ambulatory Visit: Payer: Self-pay | Admitting: *Deleted

## 2017-06-01 MED ORDER — DILTIAZEM HCL ER COATED BEADS 120 MG PO CP24: 120.0000 mg | ORAL_CAPSULE | Freq: Every day | ORAL | 1 refills | Status: DC

## 2017-06-01 NOTE — Telephone Encounter (Signed)
Pt stated that she had requested Dr. [name] to refill her diltiazem (CARDIZEM CD) 120 MG 24 hr capsule. Pt stated that she was advised by Dr. Donivan Scull office that since pt was last in the hospital Dr. Caryn Section had changed pt's medications and they wanted Dr. Caryn Section to manage this medication. Pt is requesting it be sent to [facility]. Please advise. Thanks TNP

## 2017-06-02 ENCOUNTER — Telehealth: Payer: Self-pay | Admitting: Cardiovascular Disease

## 2017-06-02 ENCOUNTER — Other Ambulatory Visit: Payer: Self-pay | Admitting: *Deleted

## 2017-06-02 MED ORDER — NITROGLYCERIN 0.4 MG SL SUBL: 0.4000 mg | SUBLINGUAL_TABLET | SUBLINGUAL | 2 refills | Status: DC | PRN

## 2017-06-02 NOTE — Telephone Encounter (Signed)
Requested Prescriptions   Signed Prescriptions Disp Refills  . nitroGLYCERIN (NITROSTAT) 0.4 MG SL tablet 25 tablet 2    Sig: Place 1 tablet (0.4 mg total) under the tongue every 5 (five) minutes as needed for chest pain.    Authorizing Provider: Minna Merritts    Ordering User: Britt Bottom

## 2017-06-02 NOTE — Telephone Encounter (Signed)
°*  STAT* If patient is at the pharmacy, call can be transferred to refill team.   1. Which medications need to be refilled? (please list name of each medication and dose if known)    Nitrostat 0.4 mg SL q 5 min PRN  2. Which pharmacy/location (including street and city if local pharmacy) is medication to be sent to? [facility] [address] [place]   3. Do they need a 30 day or 90 day supply? [place]

## 2017-06-03 ENCOUNTER — Other Ambulatory Visit: Payer: Self-pay | Admitting: Family Medicine

## 2017-06-03 NOTE — Telephone Encounter (Signed)
Requesting 90 day supply.

## 2017-06-03 NOTE — Telephone Encounter (Signed)
[facility] pharmacy faxed a refill request for a 90-days supply for the following medications. Thanks CC  gabapentin (NEURONTIN) 300 MG capsule   buPROPion (WELLBUTRIN XL) 150 MG 24 hr tablet

## 2017-06-04 MED ORDER — GABAPENTIN 300 MG PO CAPS: 300.0000 mg | ORAL_CAPSULE | Freq: Two times a day (BID) | ORAL | 3 refills | Status: DC

## 2017-06-04 MED ORDER — BUPROPION HCL ER (XL) 150 MG PO TB24: 150.0000 mg | ORAL_TABLET | Freq: Every day | ORAL | 3 refills | Status: DC

## 2017-06-08 ENCOUNTER — Telehealth: Payer: Self-pay | Admitting: Family Medicine

## 2017-06-08 MED ORDER — CITALOPRAM HYDROBROMIDE 40 MG PO TABS: 40.0000 mg | ORAL_TABLET | Freq: Every day | ORAL | 5 refills | Status: DC

## 2017-06-08 NOTE — Telephone Encounter (Signed)
Pt called saying [facility] told her that her Citalopram was not approved by her doctor.  She said she does not get 3 months at a time only one month.   She cant afford 3 months at one time.  Can this be sent back in for one month,  Thanks teri

## 2017-06-08 NOTE — Telephone Encounter (Signed)
Pt called saying the pharmacy [facility] told her that her

## 2017-06-08 NOTE — Telephone Encounter (Signed)
Please review. Thanks!  

## 2017-06-11 ENCOUNTER — Telehealth: Payer: Self-pay | Admitting: Cardiovascular Disease

## 2017-06-11 NOTE — Telephone Encounter (Signed)
Spoke with patient and she has taken 6 tablets of nitroglycerin over the last few days for her chest pain. She states that her medications were changed and that she thinks this has not helped. Confirmed her appointment for next Friday and instructed her to go to ED if her chest pain persists or worsens. Reviewed instructions for nitroglycerin and frequency. She verbalized understanding of our conversation, agreement with plan, and had no further questions at this time.

## 2017-06-11 NOTE — Telephone Encounter (Signed)
Patient came by office Has been experiencing chest pain - has been taking a lot of nitroGLYCERIN  Scheduled to see R. Dunn on 5/17

## 2017-06-12 ENCOUNTER — Ambulatory Visit (INDEPENDENT_AMBULATORY_CARE_PROVIDER_SITE_OTHER): Payer: Medicare Other

## 2017-06-12 ENCOUNTER — Encounter: Payer: Self-pay | Admitting: Gynecology

## 2017-06-12 ENCOUNTER — Other Ambulatory Visit: Payer: Self-pay

## 2017-06-12 ENCOUNTER — Ambulatory Visit
Admission: EM | Admit: 2017-06-12 | Discharge: 2017-06-12 | Disposition: A | Discharge status: Home / Self Care | Payer: Medicare Other | Attending: Emergency Medicine | Admitting: Emergency Medicine

## 2017-06-12 DIAGNOSIS — W1830XA Fall on same level, unspecified, initial encounter: Secondary | ICD-10-CM

## 2017-06-12 DIAGNOSIS — S79912A Unspecified injury of left hip, initial encounter: Secondary | ICD-10-CM | POA: Diagnosis not present

## 2017-06-12 DIAGNOSIS — M25512 Pain in left shoulder: Secondary | ICD-10-CM | POA: Diagnosis not present

## 2017-06-12 DIAGNOSIS — S7002XA Contusion of left hip, initial encounter: Secondary | ICD-10-CM

## 2017-06-12 DIAGNOSIS — M79652 Pain in left thigh: Secondary | ICD-10-CM | POA: Diagnosis not present

## 2017-06-12 DIAGNOSIS — W19XXXA Unspecified fall, initial encounter: Secondary | ICD-10-CM

## 2017-06-12 DIAGNOSIS — S79922A Unspecified injury of left thigh, initial encounter: Secondary | ICD-10-CM | POA: Diagnosis not present

## 2017-06-12 DIAGNOSIS — M25552 Pain in left hip: Secondary | ICD-10-CM | POA: Diagnosis not present

## 2017-06-12 DIAGNOSIS — S4992XA Unspecified injury of left shoulder and upper arm, initial encounter: Secondary | ICD-10-CM | POA: Diagnosis not present

## 2017-06-12 NOTE — ED Provider Notes (Addendum)
[facility]    CSN: [id number] Arrival date & time: 06/12/17  1405     History   Chief Complaint Chief Complaint  Patient presents with  . Fall    HPI [name] is a 47 y.o. female.   HPI  47 year old female presents after a fall last night.  She states that she was drunk at her nephew's house and fell onto her left side.  Witnesses states that she hit the floor.  She states that she was not aware of the fall or anything afterwards until this morning when she woke up.  She is complaining of right hip and leg pain.  She indicates from the sacroiliac joint into the upper femur.  States that she is not able to walk on it.  He states that she has been under a great deal of stress.  She is also painful on the left shoulder  And ribs.           Past Medical History:  Diagnosis Date  . Anemia    iron deficiency anemia  . Aortic arch aneurysm ([place])   . Bipolar disorder ([place])   . BRCA negative 2014  . Colon polyp   . Family history of breast cancer    BRCA neg 2014  . Gastric ulcer 04/27/2011  . Headache    Migraines  . Malignant melanoma of skin of scalp ([place])   . MI, acute, non ST segment elevation ([name])   . Morbid obesity ([place])    a. s/p gastric bypass.  . Myocardial infarction ([place]) 06/03/2015  . Neuromuscular disorder ([place])   . S/P drug eluting coronary stent placement 06/04/2015  . Sepsis ([place]) 03/18/2017    Patient Active Problem List   Diagnosis Date Noted  . Syncope 04/09/2017  . Insomnia 03/18/2017  . Ischemic cardiomyopathy   . Arthritis   . Anxiety   . Acute colitis 01/27/2017  . Hx of colonic polyps   . Bariatric surgery status   . Sinus tachycardia 02/27/2016  . H/O medication noncompliance 12/14/2015  . Acute pyelonephritis   . Emesis   . CKD (chronic kidney disease), stage IV ([place])   . CAD (coronary artery disease)   . Hypertensive heart disease   . CKD (chronic kidney disease) stage 3, GFR 30-59 ml/min (HCC) 06/05/2015    . Presence of drug coated stent in LAD coronary artery & now in D1 06/04/2015  . Colitis 06/03/2015  . Carotid stenosis 04/30/2015  . Type 2 diabetes mellitus with complication, without long-term current use of insulin ([place])   . Atypical chest pain   . Iron deficiency anemia 03/22/2015  . Vitamin B12 deficiency 02/18/2015  . Misuse of medications for pain 02/18/2015  . Severe recurrent major depression without psychotic features ([place]) 02/15/2015  . Helicobacter pylori infection 11/23/2014  . Hemiparesis, left ([place]) 11/23/2014  . Benign neoplasm of colon 11/20/2014  . Malignant melanoma ([place]) 08/25/2014  . Chronic systolic CHF (congestive heart failure) ([name])   . Incomplete bladder emptying 07/12/2014  . Adult hypothyroidism 12/30/2013  . Aberrant subclavian artery 11/17/2013  . Multiple sclerosis ([place]) 11/02/2013  . History of CVA (cerebrovascular accident) 06/20/2013  . Headache, migraine 05/29/2013  . Hyperlipidemia   . GERD (gastroesophageal reflux disease)   . Neuropathy ([place]) 01/02/2011  . CVA (cerebral vascular accident) ([place]) 06/21/2008  . Essential hypertension 05/01/2008    Past Surgical History:  Procedure Laterality Date  . APPENDECTOMY    . CARDIAC CATHETERIZATION N/A 11/09/2014  Procedure: Coronary Angiography;  Surgeon: Minna Merritts, MD;  Location: [facility];  Service: Cardiovascular;  Laterality: N/A;  . CARDIAC CATHETERIZATION N/A 11/12/2014   Procedure: Coronary Stent Intervention;  Surgeon: Isaias Cowman, MD;  Location: [facility];  Service: Cardiovascular;  Laterality: N/A;  . CARDIAC CATHETERIZATION N/A 04/18/2015   Procedure: Left Heart Cath and Coronary Angiography;  Surgeon: Minna Merritts, MD;  Location: [facility];  Service: Cardiovascular;  Laterality: N/A;  . CARDIAC CATHETERIZATION Left 06/04/2015   Procedure: Left Heart Cath and Coronary Angiography;  Surgeon: Wellington Hampshire, MD;  Location: [facility];  Service: Cardiovascular;  Laterality: Left;  . CARDIAC CATHETERIZATION N/A 06/04/2015   Procedure: Coronary Stent Intervention;  Surgeon: Wellington Hampshire, MD;  Location: [facility];  Service: Cardiovascular;  Laterality: N/A;  . CESAREAN SECTION  2001  . CHOLECYSTECTOMY N/A 11/18/2016   Procedure: LAPAROSCOPIC CHOLECYSTECTOMY WITH INTRAOPERATIVE CHOLANGIOGRAM;  Surgeon: Christene Lye, MD;  Location: [facility];  Service: General;  Laterality: N/A;  . COLONOSCOPY WITH PROPOFOL N/A 04/27/2016   Procedure: COLONOSCOPY WITH PROPOFOL;  Surgeon: [name], MD;  Location: [facility];  Service: Endoscopy;  Laterality: N/A;  . CORONARY ANGIOPLASTY    . DILATION AND CURETTAGE OF UTERUS    . ESOPHAGOGASTRODUODENOSCOPY (EGD) WITH PROPOFOL N/A 09/14/2014   Procedure: ESOPHAGOGASTRODUODENOSCOPY (EGD) WITH PROPOFOL;  Surgeon: Josefine Class, MD;  Location: [facility];  Service: Endoscopy;  Laterality: N/A;  . ESOPHAGOGASTRODUODENOSCOPY (EGD) WITH PROPOFOL N/A 04/27/2016   Procedure: ESOPHAGOGASTRODUODENOSCOPY (EGD) WITH PROPOFOL;  Surgeon: [name], MD;  Location: [facility];  Service: Endoscopy;  Laterality: N/A;  Diabetic - oral meds  . GASTRIC BYPASS  09/2009   [facility]   . Left Carotid to sublcavian artery bypass w/ subclavian artery ligation     a. Performed @ [facility].  . MELANOMA EXCISION  2016   Dr. Evorn Gong  . [place]  2002  . RIGHT OOPHORECTOMY    . SHOULDER ARTHROSCOPY WITH OPEN ROTATOR CUFF REPAIR Right 01/07/2016   Procedure: SHOULDER ARTHROSCOPY WITH DEBRIDMENT, SUBACHROMIAL DECOMPRESSION;  Surgeon: Corky Mull, MD;  Location: [facility];  Service: Orthopedics;  Laterality: Right;  . SHOULDER ARTHROSCOPY WITH OPEN ROTATOR CUFF REPAIR Right 03/16/2017   Procedure: SHOULDER ARTHROSCOPY WITH OPEN ROTATOR CUFF REPAIR POSSIBLE BICEPS TENODESIS;  Surgeon: Corky Mull, MD;  Location: [facility];  Service: Orthopedics;   Laterality: Right;  . TRIGGER FINGER RELEASE Right     Middle Finger    OB History    Gravida  7   Para  1   Term      Preterm      AB      Living        SAB      TAB      Ectopic      Multiple      Live Births               Home Medications    Prior to Admission medications   Medication Sig Start Date End Date Taking? Authorizing Provider  ALPRAZolam Duanne Moron) 0.5 MG tablet Take 0.5-1 tablets (0.25-0.5 mg total) by mouth every 6 (six) hours as needed for anxiety. 03/22/17  Yes Wieting, Richard, MD  aspirin EC 325 MG EC tablet Take 1 tablet (325 mg total) by mouth daily. 04/14/17  Yes Gouru, Illene Silver, MD  Blood Glucose Monitoring Suppl (ONE TOUCH ULTRA 2)  w/Device KIT Use to check blood sugar once a day. Dx. E11.9 03/18/17  Yes [name], MD  citalopram (CELEXA) 40 MG tablet Take 1 tablet (40 mg total) by mouth daily. 06/08/17  Yes [name], MD  diltiazem (CARDIZEM CD) 120 MG 24 hr capsule Take 1 capsule (120 mg total) by mouth daily. 06/01/17  Yes [name], MD  gabapentin (NEURONTIN) 300 MG capsule Take 1 capsule (300 mg total) by mouth 2 (two) times daily. 06/04/17  Yes [name], MD  hyoscyamine (LEVSIN SL) 0.125 MG SL tablet Place 1 tablet (0.125 mg total) under the tongue every 6 (six) hours as needed. Patient taking differently: Place 0.125 mg under the tongue every 6 (six) hours as needed for cramping.  02/01/17  Yes [name], MD  insulin lispro (HUMALOG KWIKPEN) 100 UNIT/ML KiwkPen Take between 4 and 12 units before each meal according to sliding scale 04/15/17  Yes Fisher, Kirstie Peri, MD  levothyroxine (SYNTHROID, LEVOTHROID) 25 MCG tablet Take 1 tablet (25 mcg total) daily before breakfast by mouth. 12/18/16  Yes [name], MD  losartan (COZAAR) 100 MG tablet Take 0.5 tablets (50 mg total) by mouth daily. 04/15/17  Yes [name], MD  metoprolol succinate (TOPROL-XL) 25 MG 24 hr tablet Take 0.5 tablets (12.5 mg total) by  mouth daily. 05/04/17  Yes [name], MD  Multiple Vitamin (MULTIVITAMIN WITH MINERALS) TABS tablet Take 1 tablet by mouth daily. 04/14/17  Yes Gouru, Illene Silver, MD  nitroGLYCERIN (NITROSTAT) 0.4 MG SL tablet Place 1 tablet (0.4 mg total) under the tongue every 5 (five) minutes as needed for chest pain. 06/02/17  Yes Gollan, Kathlene November, MD  pantoprazole (PROTONIX) 40 MG tablet Take 1 tablet (40 mg total) by mouth 2 (two) times daily. 05/11/16  Yes [name], MD  promethazine (PHENERGAN) 25 MG tablet Take by mouth.    [provider]  rosuvastatin (CRESTOR) 5 MG tablet  04/27/17   [provider]    Family History Family History  Problem Relation Age of Onset  . Hypertension Mother   . Anxiety disorder Mother   . Depression Mother   . Bipolar disorder Mother   . Heart disease Mother   . Hyperlipidemia Mother   . Kidney disease Father   . Heart disease Father   . Hypertension Father   . Diabetes Father   . Stroke Father   . Colon cancer Father        dx in his 72's  . Anxiety disorder Father   . Depression Father   . Skin cancer Father   . Kidney disease Sister   . Thyroid nodules Sister   . Hypertension Sister   . Hypertension Sister   . Diabetes Sister   . Hyperlipidemia Sister   . Depression Sister   . Breast cancer Maternal Aunt 68  . Breast cancer Maternal Aunt 18  . Ovarian cancer Cousin   . Colon cancer Cousin   . Breast cancer Other   . Kidney cancer Neg Hx   . Bladder Cancer Neg Hx     Social History Social History   Tobacco Use  . Smoking status: Former Smoker    Types: Cigarettes    Last attempt to quit: 08/31/1994    Years since quitting: 22.7  . Smokeless tobacco: Never Used  . Tobacco comment: quit 28 years ago  Substance Use Topics  . Alcohol use: No    Alcohol/week: 0.0 oz  .  Drug use: No     Allergies   Lipitor [atorvastatin] and Tramadol   Review of Systems Review of Systems  Constitutional: Positive for activity  change. Negative for chills, fatigue and fever.  Musculoskeletal: Positive for arthralgias, gait problem and myalgias.  All other systems reviewed and are negative.    Physical Exam Triage Vital Signs ED Triage Vitals  Enc Vitals Group     BP 06/12/17 1447 (!) 155/100     Pulse Rate 06/12/17 1447 81     Resp 06/12/17 1447 16     Temp 06/12/17 1447 98.9 F (37.2 C)     Temp Source 06/12/17 1447 Oral     SpO2 06/12/17 1447 100 %     Weight 06/12/17 1442 163 lb (73.9 kg)     Height --      Head Circumference --      Peak Flow --      Pain Score 06/12/17 1441 10     Pain Loc --      Pain Edu? --      Excl. in [place]? --    No data found.  Updated Vital Signs BP (!) 155/100 (BP Location: Left Arm)   Pulse 81   Temp 98.9 F (37.2 C) (Oral)   Resp 16   Wt 163 lb (73.9 kg)   SpO2 100%   BMI 29.81 kg/m   Visual Acuity Right Eye Distance:   Left Eye Distance:   Bilateral Distance:    Right Eye Near:   Left Eye Near:    Bilateral Near:     Physical Exam  Constitutional: She is oriented to person, place, and time. She appears well-developed and well-nourished. No distress.  HENT:  Head: Normocephalic.  Eyes: Pupils are equal, round, and reactive to light. Right eye exhibits no discharge. Left eye exhibits no discharge.  Neck: Normal range of motion.  Pulmonary/Chest: Effort normal and breath sounds normal.  Musculoskeletal: She exhibits tenderness.  [place] of the left side shows tenderness over the acromion.  There is does not appear to be any step-off of the acromioclavicular joint.  Is a fairly good range of motion of her shoulder comfortable through a range.  She does have tenderness along her lateral ribs on the left.  There is no crepitus.  Lung sounds are clear.  Tenderness along the sacroiliac joint extending into your border.  There is no tenderness to compression of the superior iliac crest.  She is also tender along the proximal femur.  Knee ankle and foot are  uninvolved.  Neurological: She is alert and oriented to person, place, and time.  Skin: Skin is dry. She is not diaphoretic.  Psychiatric: She has a normal mood and affect. Her behavior is normal. Judgment and thought content normal.  Nursing note and vitals reviewed.    UC Treatments / Results  Labs (all labs ordered are listed, but only abnormal results are displayed) Labs Reviewed - No data to display  EKG None  Radiology Dg Shoulder Left  Result Date: 06/12/2017 CLINICAL DATA:  Recent fall with left shoulder pain, initial encounter EXAM: LEFT SHOULDER - 2+ VIEW COMPARISON:  None. FINDINGS: There is no evidence of fracture or dislocation. There is no evidence of arthropathy or other focal bone abnormality. Soft tissues are unremarkable. IMPRESSION: No acute abnormality noted. Electronically Signed   By: Inez Catalina M.D.   On: 06/12/2017 17:26   Dg Hip Unilat With Pelvis 2-3 Views Left  Result Date: 06/12/2017 CLINICAL  DATA:  Recent fall with left hip pain, initial encounter EXAM: DG HIP (WITH OR WITHOUT PELVIS) 2-3V LEFT COMPARISON:  None. FINDINGS: Pelvic ring is intact. Mild degenerative changes of the hip joints are noted bilaterally. No acute fracture or dislocation is seen. No soft tissue changes are noted. IMPRESSION: Mild degenerative change without acute abnormality. Electronically Signed   By: Inez Catalina M.D.   On: 06/12/2017 17:28   Dg Femur Min 2 Views Left  Result Date: 06/12/2017 CLINICAL DATA:  Recent fall with left leg pain, initial encounter EXAM: LEFT FEMUR 2 VIEWS COMPARISON:  None. FINDINGS: Degenerative changes of left hip joint are noted. No acute fracture or dislocation is seen. No gross soft tissue abnormality is noted. IMPRESSION: No acute abnormality seen. Electronically Signed   By: Inez Catalina M.D.   On: 06/12/2017 17:29    Procedures Procedures (including critical care time)  Medications Ordered in UC Medications - No data to display  Initial  Impression / Assessment and Plan / UC Course  I have reviewed the triage vital signs and the nursing notes.  Pertinent labs & imaging results that were available during my care of the patient were reviewed by me and considered in my medical decision making (see chart for details).     Plan: 1. Test/x-ray results and diagnosis reviewed with patient 2. rx as per orders; risks, benefits, potential side effects reviewed with patient 3. Recommend supportive treatment with a walker for stability.  Use ice 20 minutes out of every 2 hours 4-5 times daily for swelling and pain.  The patient has Celebrex at home and I have advised her to take 200 mg daily with food.  Continues to have discomfort she should follow-up with her primary care physician or the orthopedic surgeon. 4. F/u prn if symptoms worsen or don't improve  Final Clinical Impressions(s) / UC Diagnoses   Final diagnoses:  Contusions stained in a fall       Discharge Instructions     Apply ice 20 minutes out of every 2 hours 4-5 times daily for comfort.     ED Prescriptions    None     Controlled Substance Prescriptions [place] Controlled Substance Registry consulted?  Not applicable   Lorin Picket, PA-C 06/12/17 1746    Lorin Picket, PA-C 06/12/17 1749

## 2017-06-12 NOTE — ED Triage Notes (Signed)
Per patient fell x last night fell at her nephew house. Per patient she was drunk when this happen. Pt. C/o right hip pain / leg.

## 2017-06-12 NOTE — Discharge Instructions (Signed)
Apply ice 20 minutes out of every 2 hours 4-5 times daily for comfort.  °

## 2017-06-18 ENCOUNTER — Encounter: Payer: Self-pay | Admitting: Physician Assistant

## 2017-06-18 ENCOUNTER — Ambulatory Visit (INDEPENDENT_AMBULATORY_CARE_PROVIDER_SITE_OTHER): Payer: Medicare Other | Admitting: Physician Assistant

## 2017-06-18 VITALS — BP 168/90 | HR 52 | Ht 62.0 in | Wt 176.0 lb

## 2017-06-18 DIAGNOSIS — E785 Hyperlipidemia, unspecified: Secondary | ICD-10-CM | POA: Diagnosis not present

## 2017-06-18 DIAGNOSIS — I25111 Atherosclerotic heart disease of native coronary artery with angina pectoris with documented spasm: Secondary | ICD-10-CM

## 2017-06-18 DIAGNOSIS — I25118 Atherosclerotic heart disease of native coronary artery with other forms of angina pectoris: Secondary | ICD-10-CM

## 2017-06-18 DIAGNOSIS — N183 Chronic kidney disease, stage 3 unspecified: Secondary | ICD-10-CM

## 2017-06-18 DIAGNOSIS — I639 Cerebral infarction, unspecified: Secondary | ICD-10-CM

## 2017-06-18 DIAGNOSIS — I10 Essential (primary) hypertension: Secondary | ICD-10-CM | POA: Diagnosis not present

## 2017-06-18 DIAGNOSIS — R001 Bradycardia, unspecified: Secondary | ICD-10-CM | POA: Diagnosis not present

## 2017-06-18 DIAGNOSIS — I5022 Chronic systolic (congestive) heart failure: Secondary | ICD-10-CM | POA: Diagnosis not present

## 2017-06-18 MED ORDER — HYDRALAZINE HCL 50 MG PO TABS: 50.0000 mg | ORAL_TABLET | Freq: Three times a day (TID) | ORAL | 3 refills | Status: DC

## 2017-06-18 MED ORDER — RANOLAZINE ER 500 MG PO TB12: 500.0000 mg | ORAL_TABLET | Freq: Two times a day (BID) | ORAL | 11 refills | Status: DC

## 2017-06-18 NOTE — Patient Instructions (Addendum)
Medication Instructions:  Your physician has recommended you make the following change in your medication:  1. START Ranexa 500 mg Twice daily 2. Hydralazine 50 mg Three times daily   Medication Samples have been provided to the patient.  Drug name: Ranexa       Strength: 500 mg        Qty: 4 boxes  LOT: [id number]  Exp.Date: 4/21  Labwork: BNP, BMP done today and we will call you with those results.   Follow-Up: You have been referred to Dr. Caryl Comes for possible Linq device  Your physician recommends that you schedule a follow-up appointment in: 2 months with Dr. [name].  It was a pleasure seeing you today here in the office. Please do not hesitate to give [place] a call back if you have any further questions. [place], BSN

## 2017-06-18 NOTE — Progress Notes (Signed)
Cardiology Office Note Date:  06/18/2017  Patient ID:  [name], [name] May 03, 1970, MRN [id number] PCP:  [name], MD  Cardiologist:  Dr. [name], MD    Chief Complaint: Chest pain  History of Present Illness: [name] is a 47 y.o. female with history of CAD status post PCI to the LAD and D1 as detailed below, chronic systolic CHF secondary to ischemic cardia myopathy, hypertensive heart disease, CKD stage III-IV, hyperlipidemia, prior medication noncompliance, recurrent strokes and falls with concern for possible MS, obesity status post gastric bypass surgery, and aortic arch aneurysm who presents for evaluation of chest pain.  Prior cardiac catheterization in 2005 showed mild nonobstructive disease with normal LV systolic function.  Nuclear stress test in 2012 in the setting of chest pain showed no ischemia. She underwent CT in 11/2013 showing right aortic arch with a large retro-esophageal vascular segment with persistent dorsal aortic segment giving off an aberrant left subclavian artery, and is s/p left carotid to subclavian artery bypass with subclavian artery ligation. She was admitted to [facility] in October 2016 with chest pain. Troponin with a peak of 0.04. She underwent nuclear stress test that was overall suboptimal due to GI uptake, EF 45-54%, no ST segmanet changes, low risk. She continued to have chest pain and underwent cardiac cath that showed proximal LAD 85% stenosis s/p PCI/DES (staged procedure given her renal dysfunction), distal LAD 30% stenosis, proximal LCx 30%.  She was readmitted to the hospital in 06/2015 with unstable angina with cardiac catheterization showing significant one-vessel CAD with a widely patent LAD stent.  There was significant stenosis affecting the D1 of 80%.  She underwent successful PCI/DES to the D1 segment.  She has continued to have presyncopal/syncopal/falling episodes that have been felt to be in the setting of orthostasis.  Outpatient  cardiac monitoring including Holter and event monitor have been unrevealing.  Multiple carotid artery ultrasounds have been unrevealing.  Most recent echocardiogram from 03/2017 showed EF of 60 to 65%, (improved from a prior level of 45%), no regional wall motion abnormalities, normal LV diastolic function, normal size left atrium, normal RVSF, normal PASP.  She has had prior MRIs of the brain that have been suspicious for MS however lumbar puncture was unrevealing.  She has been referred to the balance clinic at [facility] though she has not been able to follow-up with them.  She was most recently admitted to [facility] in 04/2017 with syncope in the setting of orthostasis, hypoglycemia, and possible acute CVA.  CT head was negative.  MRI of the brain showed patchy acute versus subacute ischemic infarcts.  Carotid Dopplers were without significant stenosis.  Recent echocardiogram the month prior as detailed above.  She was discharged on aspirin and high intensity statin.  She has since followed up with neurology and endocrinology.  Following this hospital admission, many of her antihypertensives were decreased secondary to orthostasis.  In this setting, the patient started to develop worsening chest pain.  However, since restarting her Cardizem CD 120 mill grams daily and going back up on her Toprol XL to 100 mg grams daily her chest pain symptoms have significantly improved.  She does continue to note systolic blood pressure readings at home ranging from the 150s to 180s.  She reports generalized fatigue.  She is currently chest pain-free.  Of note, the patient has previously been intolerant to Imdur secondary to headaches.  She has also been intolerant to Lipitor secondary to myalgias though is currently  tolerating Crestor 3 times per week, when she remembers to take this.    Past Medical History:  Diagnosis Date  . Anemia    iron deficiency anemia  . Aortic arch aneurysm ([place])   . Bipolar disorder ([place])   . BRCA  negative 2014  . Colon polyp   . Family history of breast cancer    BRCA neg 2014  . Gastric ulcer 04/27/2011  . Headache    Migraines  . Malignant melanoma of skin of scalp ([place])   . MI, acute, non ST segment elevation ([place])   . Morbid obesity ([place])    a. s/p gastric bypass.  . Myocardial infarction ([place]) 06/03/2015  . Neuromuscular disorder ([place])   . S/P drug eluting coronary stent placement 06/04/2015  . Sepsis ([place]) 03/18/2017    Past Surgical History:  Procedure Laterality Date  . APPENDECTOMY    . CARDIAC CATHETERIZATION N/A 11/09/2014   Procedure: Coronary Angiography;  Surgeon: Minna Merritts, MD;  Location: [place] [facility];  Service: Cardiovascular;  Laterality: N/A;  . CARDIAC CATHETERIZATION N/A 11/12/2014   Procedure: Coronary Stent Intervention;  Surgeon: Isaias Cowman, MD;  Location: [place] [facility];  Service: Cardiovascular;  Laterality: N/A;  . CARDIAC CATHETERIZATION N/A 04/18/2015   Procedure: Left Heart Cath and Coronary Angiography;  Surgeon: Minna Merritts, MD;  Location: [place] [facility];  Service: Cardiovascular;  Laterality: N/A;  . CARDIAC CATHETERIZATION Left 06/04/2015   Procedure: Left Heart Cath and Coronary Angiography;  Surgeon: Wellington Hampshire, MD;  Location: [place] [facility];  Service: Cardiovascular;  Laterality: Left;  . CARDIAC CATHETERIZATION N/A 06/04/2015   Procedure: Coronary Stent Intervention;  Surgeon: Wellington Hampshire, MD;  Location: [place] [facility];  Service: Cardiovascular;  Laterality: N/A;  . CESAREAN SECTION  2001  . CHOLECYSTECTOMY N/A 11/18/2016   Procedure: LAPAROSCOPIC CHOLECYSTECTOMY WITH INTRAOPERATIVE CHOLANGIOGRAM;  Surgeon: Christene Lye, MD;  Location: [facility];  Service: General;  Laterality: N/A;  . COLONOSCOPY WITH PROPOFOL N/A 04/27/2016   Procedure: COLONOSCOPY WITH PROPOFOL;  Surgeon: [name], MD;  Location: [place];  Service: Endoscopy;  Laterality: N/A;  . CORONARY  ANGIOPLASTY    . DILATION AND CURETTAGE OF UTERUS    . ESOPHAGOGASTRODUODENOSCOPY (EGD) WITH PROPOFOL N/A 09/14/2014   Procedure: ESOPHAGOGASTRODUODENOSCOPY (EGD) WITH PROPOFOL;  Surgeon: Josefine Class, MD;  Location: [facility];  Service: Endoscopy;  Laterality: N/A;  . ESOPHAGOGASTRODUODENOSCOPY (EGD) WITH PROPOFOL N/A 04/27/2016   Procedure: ESOPHAGOGASTRODUODENOSCOPY (EGD) WITH PROPOFOL;  Surgeon: [name], MD;  Location: [place];  Service: Endoscopy;  Laterality: N/A;  Diabetic - oral meds  . GASTRIC BYPASS  09/2009   [facility]   . Left Carotid to sublcavian artery bypass w/ subclavian artery ligation     a. Performed @ [facility].  . MELANOMA EXCISION  2016   Dr. Evorn Gong  . [place]  2002  . RIGHT OOPHORECTOMY    . SHOULDER ARTHROSCOPY WITH OPEN ROTATOR CUFF REPAIR Right 01/07/2016   Procedure: SHOULDER ARTHROSCOPY WITH DEBRIDMENT, SUBACHROMIAL DECOMPRESSION;  Surgeon: Corky Mull, MD;  Location: [facility];  Service: Orthopedics;  Laterality: Right;  . SHOULDER ARTHROSCOPY WITH OPEN ROTATOR CUFF REPAIR Right 03/16/2017   Procedure: SHOULDER ARTHROSCOPY WITH OPEN ROTATOR CUFF REPAIR POSSIBLE BICEPS TENODESIS;  Surgeon: Corky Mull, MD;  Location: [facility];  Service: Orthopedics;  Laterality: Right;  . TRIGGER FINGER RELEASE Right     Middle Finger    Current  Meds  Medication Sig  . ALPRAZolam (XANAX) 0.5 MG tablet Take 0.5-1 tablets (0.25-0.5 mg total) by mouth every 6 (six) hours as needed for anxiety.  Marland Kitchen aspirin EC 325 MG EC tablet Take 1 tablet (325 mg total) by mouth daily.  . Blood Glucose Monitoring Suppl (ONE TOUCH ULTRA 2) w/Device KIT Use to check blood sugar once a day. Dx. E11.9  . celecoxib (CELEBREX) 200 MG capsule Take 200 mg by mouth 2 (two) times daily as needed.  . citalopram (CELEXA) 40 MG tablet Take 1 tablet (40 mg total) by mouth daily.  Marland Kitchen diltiazem (CARDIZEM CD) 120 MG 24 hr capsule Take 1 capsule (120 mg total) by  mouth daily.  Marland Kitchen gabapentin (NEURONTIN) 300 MG capsule Take 1 capsule (300 mg total) by mouth 2 (two) times daily.  . hyoscyamine (LEVSIN SL) 0.125 MG SL tablet Place 1 tablet (0.125 mg total) under the tongue every 6 (six) hours as needed. (Patient taking differently: Place 0.125 mg under the tongue every 6 (six) hours as needed for cramping. )  . insulin lispro (HUMALOG KWIKPEN) 100 UNIT/ML KiwkPen Take between 4 and 12 units before each meal according to sliding scale  . levothyroxine (SYNTHROID, LEVOTHROID) 25 MCG tablet Take 1 tablet (25 mcg total) daily before breakfast by mouth.  . losartan (COZAAR) 100 MG tablet Take 0.5 tablets (50 mg total) by mouth daily. (Patient taking differently: Take 100 mg by mouth daily. )  . metoprolol succinate (TOPROL-XL) 100 MG 24 hr tablet Take 100 mg by mouth daily. Take with or immediately following a meal.  . Multiple Vitamin (MULTIVITAMIN WITH MINERALS) TABS tablet Take 1 tablet by mouth daily.  . nitroGLYCERIN (NITROSTAT) 0.4 MG SL tablet Place 1 tablet (0.4 mg total) under the tongue every 5 (five) minutes as needed for chest pain.  . pantoprazole (PROTONIX) 40 MG tablet Take 1 tablet (40 mg total) by mouth 2 (two) times daily.  . promethazine (PHENERGAN) 25 MG tablet Take by mouth.  . rosuvastatin (CRESTOR) 5 MG tablet     Allergies:   Lipitor [atorvastatin] and Tramadol   Social History:  The patient  reports that she quit smoking about 22 years ago. Her smoking use included cigarettes. She has never used smokeless tobacco. She reports that she does not drink alcohol or use drugs.   Family History:  The patient's family history includes Anxiety disorder in her father and mother; Bipolar disorder in her mother; Breast cancer in her other; Breast cancer (age of onset: 97) in her maternal aunt and maternal aunt; Colon cancer in her cousin and father; Depression in her father, mother, and sister; Diabetes in her father and sister; Heart disease in her  father and mother; Hyperlipidemia in her mother and sister; Hypertension in her father, mother, sister, and sister; Kidney disease in her father and sister; Ovarian cancer in her cousin; Skin cancer in her father; Stroke in her father; Thyroid nodules in her sister.  ROS:   Review of Systems  Constitutional: Positive for malaise/fatigue. Negative for chills, diaphoresis, fever and weight loss.  HENT: Negative for congestion.   Eyes: Negative for discharge and redness.  Respiratory: Negative for cough, hemoptysis, sputum production, shortness of breath and wheezing.   Cardiovascular: Positive for chest pain and leg swelling. Negative for palpitations, orthopnea, claudication and PND.  Gastrointestinal: Negative for abdominal pain, blood in stool, heartburn, melena, nausea and vomiting.  Genitourinary: Negative for hematuria.  Musculoskeletal: Positive for falls. Negative for myalgias.  Skin: Negative for  rash.  Neurological: Positive for weakness and headaches. Negative for dizziness, tingling, tremors, sensory change, speech change, focal weakness and loss of consciousness.  Endo/Heme/Allergies: Does not bruise/bleed easily.  Psychiatric/Behavioral: Negative for substance abuse. The patient is nervous/anxious.   All other systems reviewed and are negative.    PHYSICAL EXAM:  VS:  BP (!) 168/90 (BP Location: Left Arm, Patient Position: Sitting, Cuff Size: Normal)   Pulse (!) 52   Ht _0  (1.575 m)   Wt 176 lb (79.8 kg)   BMI 32.19 kg/m  BMI: Body mass index is 32.19 kg/m.  Physical Exam  Constitutional: She is oriented to person, place, and time. She appears well-developed and well-nourished.  HENT:  Head: Normocephalic and atraumatic.  Eyes: Right eye exhibits no discharge. Left eye exhibits no discharge.  Neck: Normal range of motion. No JVD present.  Cardiovascular: Regular rhythm, S1 normal, S2 normal and normal heart sounds. Bradycardia present. Exam reveals no distant heart  sounds, no friction rub, no midsystolic click and no opening snap.  No murmur heard. Pulses:      Posterior tibial pulses are 2+ on the right side, and 2+ on the left side.  Pulmonary/Chest: Effort normal and breath sounds normal. No respiratory distress. She has no decreased breath sounds. She has no wheezes. She has no rales. She exhibits no tenderness.  Abdominal: Soft. She exhibits no distension. There is no tenderness.  Musculoskeletal: She exhibits no edema.  Neurological: She is alert and oriented to person, place, and time.  Skin: Skin is warm and dry. No cyanosis. Nails show no clubbing.  Psychiatric: She has a normal mood and affect. Her speech is normal and behavior is normal. Judgment and thought content normal.     EKG:  Was ordered and interpreted by me today. Shows sinus bradycardia, 52 bpm, no acute st/t changes  Recent Labs: 03/19/2017: Magnesium 2.3; TSH 1.013 04/09/2017: ALT 35; BUN 20; Creatinine, Ser 2.06; Hemoglobin 11.0; Platelets 202; Potassium 4.1; Sodium 139  04/10/2017: Cholesterol 134; HDL 29; LDL Cholesterol 88; Total CHOL/HDL Ratio 4.6; Triglycerides 85; VLDL 17   CrCl cannot be calculated (Patient's most recent lab result is older than the maximum 21 days allowed.).   Wt Readings from Last 3 Encounters:  06/18/17 176 lb (79.8 kg)  06/12/17 163 lb (73.9 kg)  04/09/17 166 lb 3.2 oz (75.4 kg)     Other studies reviewed: Additional studies/records reviewed today include: summarized above  ASSESSMENT AND PLAN:  1. CAD of the native coronary arteries with stable angina: Discussed with patient conservative management versus noninvasive versus invasive evaluation.  She has previously been known to have a normal Myoview in the setting of significant CAD requiring PCI.  She prefers to avoid nuclear stress testing at this time.  However, given her recent stroke, proceeding directly to cardiac catheterization may be risky.  Because of this, we have agreed to provide her  with a trial of Ranexa 500 mg twice daily (samples provided).  She will let us know if this helps with her stable angina.  She is intolerant to Imdur secondary to headache.  Perhaps with the recent reinitiation of Cardizem and titration of metoprolol her angina will remain stable.  However, should her symptoms escalate she may require repeat cardiac catheterization.  She remains on full dose aspirin secondary to recurrent strokes per neurology.  2. Recurrent strokes: Followed by neurology.  Refer to EP for evaluation of possible Linq recorder, +/-TEE.  Continue full dose aspirin per neurology.  Aggressive blood pressure, heart rate, and lipid control recommended.\  3. Sinus bradycardia: Asymptomatic.  Discussed tapering down/discontinuation of Cardizem/metoprolol.  Patient prefers to keep both these medications at their current dose as she is asymptomatic and feels they significantly help with her stable angina.  4. CKD stage III: Followed by nephrology with most recent serum creatinine from 05/2017 of 1.59.  5. Accelerated HTN: Add hydralazine 50 mg 3 times daily.  Continue Cardizem CD 120 mill grams daily, losartan 100 mg daily, Toprol-XL 100 mg daily.  6. HLD: Tolerating Crestor 5 mg 3 times a week, when patient remembers to take this.  Managed by PCP.  7. Falls: Has been referred to the [facility] balance clinic.  There is concern for possible MS.  Defer to PCP.  Disposition: F/u with Dr. [name] in 2-3 months.   Current medicines are reviewed at length with the patient today.  The patient did not have any concerns regarding medicines.  Melvern Banker PA-C 06/18/2017 10:59 AM     [facility] [place], [place] [address] [phone]

## 2017-06-18 NOTE — H&P (View-Only) (Signed)
Cardiology Office Note Date:  06/18/2017  Patient ID:  Kendra, [name] May 03, 1970, MRN [id number] PCP:  [name], MD  Cardiologist:  Dr. [name], MD    Chief Complaint: Chest pain  History of Present Illness: [name] is a 47 y.o. female with history of CAD status post PCI to the LAD and D1 as detailed below, chronic systolic CHF secondary to ischemic cardia myopathy, hypertensive heart disease, CKD stage III-IV, hyperlipidemia, prior medication noncompliance, recurrent strokes and falls with concern for possible MS, obesity status post gastric bypass surgery, and aortic arch aneurysm who presents for evaluation of chest pain.  Prior cardiac catheterization in 2005 showed mild nonobstructive disease with normal LV systolic function.  Nuclear stress test in 2012 in the setting of chest pain showed no ischemia. She underwent CT in 11/2013 showing right aortic arch with a large retro-esophageal vascular segment with persistent dorsal aortic segment giving off an aberrant left subclavian artery, and is s/p left carotid to subclavian artery bypass with subclavian artery ligation. She was admitted to [facility] in October 2016 with chest pain. Troponin with a peak of 0.04. She underwent nuclear stress test that was overall suboptimal due to GI uptake, EF 45-54%, no ST segmanet changes, low risk. She continued to have chest pain and underwent cardiac cath that showed proximal LAD 85% stenosis s/p PCI/DES (staged procedure given her renal dysfunction), distal LAD 30% stenosis, proximal LCx 30%.  She was readmitted to the hospital in 06/2015 with unstable angina with cardiac catheterization showing significant one-vessel CAD with a widely patent LAD stent.  There was significant stenosis affecting the D1 of 80%.  She underwent successful PCI/DES to the D1 segment.  She has continued to have presyncopal/syncopal/falling episodes that have been felt to be in the setting of orthostasis.  Outpatient  cardiac monitoring including Holter and event monitor have been unrevealing.  Multiple carotid artery ultrasounds have been unrevealing.  Most recent echocardiogram from 03/2017 showed EF of 60 to 65%, (improved from a prior level of 45%), no regional wall motion abnormalities, normal LV diastolic function, normal size left atrium, normal RVSF, normal PASP.  She has had prior MRIs of the brain that have been suspicious for MS however lumbar puncture was unrevealing.  She has been referred to the balance clinic at [facility] though she has not been able to follow-up with them.  She was most recently admitted to [facility] in 04/2017 with syncope in the setting of orthostasis, hypoglycemia, and possible acute CVA.  CT head was negative.  MRI of the brain showed patchy acute versus subacute ischemic infarcts.  Carotid Dopplers were without significant stenosis.  Recent echocardiogram the month prior as detailed above.  She was discharged on aspirin and high intensity statin.  She has since followed up with neurology and endocrinology.  Following this hospital admission, many of her antihypertensives were decreased secondary to orthostasis.  In this setting, the patient started to develop worsening chest pain.  However, since restarting her Cardizem CD 120 mill grams daily and going back up on her Toprol XL to 100 mg grams daily her chest pain symptoms have significantly improved.  She does continue to note systolic blood pressure readings at home ranging from the 150s to 180s.  She reports generalized fatigue.  She is currently chest pain-free.  Of note, the patient has previously been intolerant to Imdur secondary to headaches.  She has also been intolerant to Lipitor secondary to myalgias though is currently  tolerating Crestor 3 times per week, when she remembers to take this.    Past Medical History:  Diagnosis Date  . Anemia    iron deficiency anemia  . Aortic arch aneurysm ([place])   . Bipolar disorder ([place])   . BRCA  negative 2014  . Colon polyp   . Family history of breast cancer    BRCA neg 2014  . Gastric ulcer 04/27/2011  . Headache    Migraines  . Malignant melanoma of skin of scalp ([place])   . MI, acute, non ST segment elevation ([place])   . Morbid obesity ([place])    a. s/p gastric bypass.  . Myocardial infarction ([place]) 06/03/2015  . Neuromuscular disorder ([place])   . S/P drug eluting coronary stent placement 06/04/2015  . Sepsis ([place]) 03/18/2017    Past Surgical History:  Procedure Laterality Date  . APPENDECTOMY    . CARDIAC CATHETERIZATION N/A 11/09/2014   Procedure: Coronary Angiography;  Surgeon: Minna Merritts, MD;  Location: [place] [facility];  Service: Cardiovascular;  Laterality: N/A;  . CARDIAC CATHETERIZATION N/A 11/12/2014   Procedure: Coronary Stent Intervention;  Surgeon: Isaias Cowman, MD;  Location: [place] [facility];  Service: Cardiovascular;  Laterality: N/A;  . CARDIAC CATHETERIZATION N/A 04/18/2015   Procedure: Left Heart Cath and Coronary Angiography;  Surgeon: Minna Merritts, MD;  Location: [place] [facility];  Service: Cardiovascular;  Laterality: N/A;  . CARDIAC CATHETERIZATION Left 06/04/2015   Procedure: Left Heart Cath and Coronary Angiography;  Surgeon: Wellington Hampshire, MD;  Location: [place] [facility];  Service: Cardiovascular;  Laterality: Left;  . CARDIAC CATHETERIZATION N/A 06/04/2015   Procedure: Coronary Stent Intervention;  Surgeon: Wellington Hampshire, MD;  Location: [place] [facility];  Service: Cardiovascular;  Laterality: N/A;  . CESAREAN SECTION  2001  . CHOLECYSTECTOMY N/A 11/18/2016   Procedure: LAPAROSCOPIC CHOLECYSTECTOMY WITH INTRAOPERATIVE CHOLANGIOGRAM;  Surgeon: Christene Lye, MD;  Location: [facility];  Service: General;  Laterality: N/A;  . COLONOSCOPY WITH PROPOFOL N/A 04/27/2016   Procedure: COLONOSCOPY WITH PROPOFOL;  Surgeon: [name], MD;  Location: [place];  Service: Endoscopy;  Laterality: N/A;  . CORONARY  ANGIOPLASTY    . DILATION AND CURETTAGE OF UTERUS    . ESOPHAGOGASTRODUODENOSCOPY (EGD) WITH PROPOFOL N/A 09/14/2014   Procedure: ESOPHAGOGASTRODUODENOSCOPY (EGD) WITH PROPOFOL;  Surgeon: Josefine Class, MD;  Location: [facility]COPY;  Service: Endoscopy;  Laterality: N/A;  . ESOPHAGOGASTRODUODENOSCOPY (EGD) WITH PROPOFOL N/A 04/27/2016   Procedure: ESOPHAGOGASTRODUODENOSCOPY (EGD) WITH PROPOFOL;  Surgeon: [name], MD;  Location: [place];  Service: Endoscopy;  Laterality: N/A;  Diabetic - oral meds  . GASTRIC BYPASS  09/2009   [facility]   . Left Carotid to sublcavian artery bypass w/ subclavian artery ligation     a. Performed @ [place].  . MELANOMA EXCISION  2016   Dr. Evorn Gong  . [place]  2002  . RIGHT OOPHORECTOMY    . SHOULDER ARTHROSCOPY WITH OPEN ROTATOR CUFF REPAIR Right 01/07/2016   Procedure: SHOULDER ARTHROSCOPY WITH DEBRIDMENT, SUBACHROMIAL DECOMPRESSION;  Surgeon: Corky Mull, MD;  Location: [facility];  Service: Orthopedics;  Laterality: Right;  . SHOULDER ARTHROSCOPY WITH OPEN ROTATOR CUFF REPAIR Right 03/16/2017   Procedure: SHOULDER ARTHROSCOPY WITH OPEN ROTATOR CUFF REPAIR POSSIBLE BICEPS TENODESIS;  Surgeon: Corky Mull, MD;  Location: [facility];  Service: Orthopedics;  Laterality: Right;  . TRIGGER FINGER RELEASE Right     [place]    Current  Meds  Medication Sig  . ALPRAZolam (XANAX) 0.5 MG tablet Take 0.5-1 tablets (0.25-0.5 mg total) by mouth every 6 (six) hours as needed for anxiety.  Marland Kitchen aspirin EC 325 MG EC tablet Take 1 tablet (325 mg total) by mouth daily.  . Blood Glucose Monitoring Suppl (ONE TOUCH ULTRA 2) w/Device KIT Use to check blood sugar once a day. Dx. E11.9  . celecoxib (CELEBREX) 200 MG capsule Take 200 mg by mouth 2 (two) times daily as needed.  . citalopram (CELEXA) 40 MG tablet Take 1 tablet (40 mg total) by mouth daily.  Marland Kitchen diltiazem (CARDIZEM CD) 120 MG 24 hr capsule Take 1 capsule (120 mg total) by  mouth daily.  Marland Kitchen gabapentin (NEURONTIN) 300 MG capsule Take 1 capsule (300 mg total) by mouth 2 (two) times daily.  . hyoscyamine (LEVSIN SL) 0.125 MG SL tablet Place 1 tablet (0.125 mg total) under the tongue every 6 (six) hours as needed. (Patient taking differently: Place 0.125 mg under the tongue every 6 (six) hours as needed for cramping. )  . insulin lispro (HUMALOG KWIKPEN) 100 UNIT/ML KiwkPen Take between 4 and 12 units before each meal according to sliding scale  . levothyroxine (SYNTHROID, LEVOTHROID) 25 MCG tablet Take 1 tablet (25 mcg total) daily before breakfast by mouth.  . losartan (COZAAR) 100 MG tablet Take 0.5 tablets (50 mg total) by mouth daily. (Patient taking differently: Take 100 mg by mouth daily. )  . metoprolol succinate (TOPROL-XL) 100 MG 24 hr tablet Take 100 mg by mouth daily. Take with or immediately following a meal.  . Multiple Vitamin (MULTIVITAMIN WITH MINERALS) TABS tablet Take 1 tablet by mouth daily.  . nitroGLYCERIN (NITROSTAT) 0.4 MG SL tablet Place 1 tablet (0.4 mg total) under the tongue every 5 (five) minutes as needed for chest pain.  . pantoprazole (PROTONIX) 40 MG tablet Take 1 tablet (40 mg total) by mouth 2 (two) times daily.  . promethazine (PHENERGAN) 25 MG tablet Take by mouth.  . rosuvastatin (CRESTOR) 5 MG tablet     Allergies:   Lipitor [atorvastatin] and Tramadol   Social History:  The patient  reports that she quit smoking about 22 years ago. Her smoking use included cigarettes. She has never used smokeless tobacco. She reports that she does not drink alcohol or use drugs.   Family History:  The patient's family history includes Anxiety disorder in her father and mother; Bipolar disorder in her mother; Breast cancer in her other; Breast cancer (age of onset: 97) in her maternal aunt and maternal aunt; Colon cancer in her cousin and father; Depression in her father, mother, and sister; Diabetes in her father and sister; Heart disease in her  father and mother; Hyperlipidemia in her mother and sister; Hypertension in her father, mother, sister, and sister; Kidney disease in her father and sister; Ovarian cancer in her cousin; Skin cancer in her father; Stroke in her father; Thyroid nodules in her sister.  ROS:   Review of Systems  Constitutional: Positive for malaise/fatigue. Negative for chills, diaphoresis, fever and weight loss.  HENT: Negative for congestion.   Eyes: Negative for discharge and redness.  Respiratory: Negative for cough, hemoptysis, sputum production, shortness of breath and wheezing.   Cardiovascular: Positive for chest pain and leg swelling. Negative for palpitations, orthopnea, claudication and PND.  Gastrointestinal: Negative for abdominal pain, blood in stool, heartburn, melena, nausea and vomiting.  Genitourinary: Negative for hematuria.  Musculoskeletal: Positive for falls. Negative for myalgias.  Skin: Negative for  rash.  Neurological: Positive for weakness and headaches. Negative for dizziness, tingling, tremors, sensory change, speech change, focal weakness and loss of consciousness.  Endo/Heme/Allergies: Does not bruise/bleed easily.  Psychiatric/Behavioral: Negative for substance abuse. The patient is nervous/anxious.   All other systems reviewed and are negative.    PHYSICAL EXAM:  VS:  BP (!) 168/90 (BP Location: Left Arm, Patient Position: Sitting, Cuff Size: Normal)   Pulse (!) 52   Ht _0  (1.575 m)   Wt 176 lb (79.8 kg)   BMI 32.19 kg/m  BMI: Body mass index is 32.19 kg/m.  Physical Exam  Constitutional: She is oriented to person, place, and time. She appears well-developed and well-nourished.  HENT:  Head: Normocephalic and atraumatic.  Eyes: Right eye exhibits no discharge. Left eye exhibits no discharge.  Neck: Normal range of motion. No JVD present.  Cardiovascular: Regular rhythm, S1 normal, S2 normal and normal heart sounds. Bradycardia present. Exam reveals no distant heart  sounds, no friction rub, no midsystolic click and no opening snap.  No murmur heard. Pulses:      Posterior tibial pulses are 2+ on the right side, and 2+ on the left side.  Pulmonary/Chest: Effort normal and breath sounds normal. No respiratory distress. She has no decreased breath sounds. She has no wheezes. She has no rales. She exhibits no tenderness.  Abdominal: Soft. She exhibits no distension. There is no tenderness.  Musculoskeletal: She exhibits no edema.  Neurological: She is alert and oriented to person, place, and time.  Skin: Skin is warm and dry. No cyanosis. Nails show no clubbing.  Psychiatric: She has a normal mood and affect. Her speech is normal and behavior is normal. Judgment and thought content normal.     EKG:  Was ordered and interpreted by me today. Shows sinus bradycardia, 52 bpm, no acute st/t changes  Recent Labs: 03/19/2017: Magnesium 2.3; TSH 1.013 04/09/2017: ALT 35; BUN 20; Creatinine, Ser 2.06; Hemoglobin 11.0; Platelets 202; Potassium 4.1; Sodium 139  04/10/2017: Cholesterol 134; HDL 29; LDL Cholesterol 88; Total CHOL/HDL Ratio 4.6; Triglycerides 85; VLDL 17   CrCl cannot be calculated (Patient's most recent lab result is older than the maximum 21 days allowed.).   Wt Readings from Last 3 Encounters:  06/18/17 176 lb (79.8 kg)  06/12/17 163 lb (73.9 kg)  04/09/17 166 lb 3.2 oz (75.4 kg)     Other studies reviewed: Additional studies/records reviewed today include: summarized above  ASSESSMENT AND PLAN:  1. CAD of the native coronary arteries with stable angina: Discussed with patient conservative management versus noninvasive versus invasive evaluation.  She has previously been known to have a normal Myoview in the setting of significant CAD requiring PCI.  She prefers to avoid nuclear stress testing at this time.  However, given her recent stroke, proceeding directly to cardiac catheterization may be risky.  Because of this, we have agreed to provide her  with a trial of Ranexa 500 mg twice daily (samples provided).  She will let us know if this helps with her stable angina.  She is intolerant to Imdur secondary to headache.  Perhaps with the recent reinitiation of Cardizem and titration of metoprolol her angina will remain stable.  However, should her symptoms escalate she may require repeat cardiac catheterization.  She remains on full dose aspirin secondary to recurrent strokes per neurology.  2. Recurrent strokes: Followed by neurology.  Refer to EP for evaluation of possible Linq recorder, +/-TEE.  Continue full dose aspirin per neurology.  Aggressive blood pressure, heart rate, and lipid control recommended.\  3. Sinus bradycardia: Asymptomatic.  Discussed tapering down/discontinuation of Cardizem/metoprolol.  Patient prefers to keep both these medications at their current dose as she is asymptomatic and feels they significantly help with her stable angina.  4. CKD stage III: Followed by nephrology with most recent serum creatinine from 05/2017 of 1.59.  5. Accelerated HTN: Add hydralazine 50 mg 3 times daily.  Continue Cardizem CD 120 mill grams daily, losartan 100 mg daily, Toprol-XL 100 mg daily.  6. HLD: Tolerating Crestor 5 mg 3 times a week, when patient remembers to take this.  Managed by PCP.  7. Falls: Has been referred to the [facility] balance clinic.  There is concern for possible MS.  Defer to PCP.  Disposition: F/u with Dr. [name] in 2-3 months.   Current medicines are reviewed at length with the patient today.  The patient did not have any concerns regarding medicines.  Melvern Banker PA-C 06/18/2017 10:59 AM     [address] [place], [place] [address] [phone]

## 2017-06-19 LAB — BASIC METABOLIC PANEL
BUN / CREAT RATIO: 10 (ref 9–23)
BUN: 17 mg/dL (ref 6–24)
CO2: 19 mmol/L — AB (ref 20–29)
CREATININE: 1.62 mg/dL — AB (ref 0.57–1.00)
Calcium: 8.6 mg/dL — ABNORMAL LOW (ref 8.7–10.2)
Chloride: 109 mmol/L — ABNORMAL HIGH (ref 96–106)
GFR calc Af Amer: 43 mL/min/{1.73_m2} — ABNORMAL LOW (ref >59)
GFR calc non Af Amer: 37 mL/min/{1.73_m2} — ABNORMAL LOW (ref >59)
GLUCOSE: 78 mg/dL (ref 65–99)
POTASSIUM: 4.1 mmol/L (ref 3.5–5.2)
SODIUM: 143 mmol/L (ref 134–144)

## 2017-06-19 LAB — BRAIN NATRIURETIC PEPTIDE: BNP: 317.4 pg/mL — ABNORMAL HIGH (ref 0.0–100.0)

## 2017-06-21 ENCOUNTER — Telehealth: Payer: Self-pay | Admitting: *Deleted

## 2017-06-21 NOTE — Telephone Encounter (Signed)
Spoke with patient and reviewed results. She reports that she is having swelling to her legs, ankles, and feet which are concerning her. This is a increase for her and she is not on any medications to help with the swelling. Instructed her to try compression socks/hose and to keep her feet elevated when possible. Advised that I would route to provider for review. She verbalized understanding with no further questions at this time.

## 2017-06-21 NOTE — Telephone Encounter (Signed)
-----   Message from Rise Mu, PA-C sent at 06/21/2017  7:08 AM EDT ----- Please call the patient. BNP is mildly elevated.  Renal function improved from prior.  Potassium at goal.

## 2017-06-22 ENCOUNTER — Encounter: Payer: Self-pay | Admitting: Physician Assistant

## 2017-06-22 NOTE — Telephone Encounter (Signed)
Spoke with patient and reviewed recommendations from provider and she verbalized understanding with no further questions at this time. Advised that she should call back if the swelling persists or worsens after trying the compression hose.

## 2017-06-22 NOTE — Telephone Encounter (Signed)
Based on her recent echo, this is likely from venous insufficiency. Agree with recommendation to elevate legs and wear compression hose. Given her renal function issues, I would like to avoid lasix.

## 2017-06-24 ENCOUNTER — Encounter: Payer: Self-pay | Admitting: *Deleted

## 2017-06-24 ENCOUNTER — Other Ambulatory Visit
Admission: RE | Admit: 2017-06-24 | Discharge: 2017-06-24 | Disposition: A | Discharge status: Home / Self Care | Payer: Medicare Other | Source: Ambulatory Visit | Attending: Cardiovascular Disease | Admitting: Cardiovascular Disease

## 2017-06-24 ENCOUNTER — Telehealth: Payer: Self-pay | Admitting: Cardiovascular Disease

## 2017-06-24 DIAGNOSIS — Z01812 Encounter for preprocedural laboratory examination: Secondary | ICD-10-CM

## 2017-06-24 DIAGNOSIS — I25118 Atherosclerotic heart disease of native coronary artery with other forms of angina pectoris: Secondary | ICD-10-CM | POA: Diagnosis not present

## 2017-06-24 DIAGNOSIS — I200 Unstable angina: Secondary | ICD-10-CM | POA: Insufficient documentation

## 2017-06-24 LAB — BASIC METABOLIC PANEL
Anion gap: 9 (ref 5–15)
BUN: 26 mg/dL — AB (ref 6–20)
CHLORIDE: 104 mmol/L (ref 101–111)
CO2: 23 mmol/L (ref 22–32)
Calcium: 8.7 mg/dL — ABNORMAL LOW (ref 8.9–10.3)
Creatinine, Ser: 2.02 mg/dL — ABNORMAL HIGH (ref 0.44–1.00)
GFR calc Af Amer: 32 mL/min — ABNORMAL LOW (ref >60)
GFR calc non Af Amer: 28 mL/min — ABNORMAL LOW (ref >60)
GLUCOSE: 133 mg/dL — AB (ref 65–99)
POTASSIUM: 3.7 mmol/L (ref 3.5–5.1)
SODIUM: 136 mmol/L (ref 135–145)

## 2017-06-24 LAB — CBC WITH DIFFERENTIAL/PLATELET
Basophils Absolute: 0.1 10*3/uL (ref 0–0.1)
Basophils Relative: 1 %
EOS PCT: 3 %
Eosinophils Absolute: 0.3 10*3/uL (ref 0–0.7)
HEMATOCRIT: 37.7 % (ref 35.0–47.0)
Hemoglobin: 12.7 g/dL (ref 12.0–16.0)
LYMPHS PCT: 16 %
Lymphs Abs: 1.7 10*3/uL (ref 1.0–3.6)
MCH: 27.7 pg (ref 26.0–34.0)
MCHC: 33.7 g/dL (ref 32.0–36.0)
MCV: 82.2 fL (ref 80.0–100.0)
Monocytes Absolute: 0.6 10*3/uL (ref 0.2–0.9)
Monocytes Relative: 6 %
Neutro Abs: 7.5 10*3/uL — ABNORMAL HIGH (ref 1.4–6.5)
Neutrophils Relative %: 74 %
PLATELETS: 219 10*3/uL (ref 150–440)
RBC: 4.58 MIL/uL (ref 3.80–5.20)
RDW: 13.6 % (ref 11.5–14.5)
WBC: 10.2 10*3/uL (ref 3.6–11.0)

## 2017-06-24 NOTE — Telephone Encounter (Signed)
Spoke with patient and reviewed that we have her scheduled for Tuesday 07/30/17 with arrival time of 07:00 AM for hydration prior to her procedure. Advised that she would need to hold some medications and have some labs done over at the medical mall entrance. Instructed her to come by for letter with instructions and she verbalized understanding with no further questions at this time.

## 2017-06-28 ENCOUNTER — Other Ambulatory Visit: Payer: Self-pay | Admitting: Physician Assistant

## 2017-06-28 DIAGNOSIS — I200 Unstable angina: Secondary | ICD-10-CM

## 2017-06-29 ENCOUNTER — Other Ambulatory Visit: Payer: Self-pay

## 2017-06-29 ENCOUNTER — Encounter
Admission: RE | Disposition: A | Discharge status: Home / Self Care | Payer: Self-pay | Source: Ambulatory Visit | Attending: Cardiovascular Disease

## 2017-06-29 ENCOUNTER — Ambulatory Visit
Admission: RE | Admit: 2017-06-29 | Discharge: 2017-06-30 | Disposition: A | Discharge status: Home / Self Care | Payer: Medicare Other | Source: Ambulatory Visit | Attending: Cardiovascular Disease | Admitting: Cardiovascular Disease

## 2017-06-29 DIAGNOSIS — Z7982 Long term (current) use of aspirin: Secondary | ICD-10-CM | POA: Insufficient documentation

## 2017-06-29 DIAGNOSIS — G43909 Migraine, unspecified, not intractable, without status migrainosus: Secondary | ICD-10-CM | POA: Diagnosis not present

## 2017-06-29 DIAGNOSIS — Z8673 Personal history of transient ischemic attack (TIA), and cerebral infarction without residual deficits: Secondary | ICD-10-CM | POA: Diagnosis not present

## 2017-06-29 DIAGNOSIS — I2510 Atherosclerotic heart disease of native coronary artery without angina pectoris: Secondary | ICD-10-CM | POA: Diagnosis not present

## 2017-06-29 DIAGNOSIS — N184 Chronic kidney disease, stage 4 (severe): Secondary | ICD-10-CM | POA: Insufficient documentation

## 2017-06-29 DIAGNOSIS — I130 Hypertensive heart and chronic kidney disease with heart failure and stage 1 through stage 4 chronic kidney disease, or unspecified chronic kidney disease: Secondary | ICD-10-CM | POA: Insufficient documentation

## 2017-06-29 DIAGNOSIS — I25110 Atherosclerotic heart disease of native coronary artery with unstable angina pectoris: Secondary | ICD-10-CM | POA: Diagnosis not present

## 2017-06-29 DIAGNOSIS — E785 Hyperlipidemia, unspecified: Secondary | ICD-10-CM | POA: Insufficient documentation

## 2017-06-29 DIAGNOSIS — F319 Bipolar disorder, unspecified: Secondary | ICD-10-CM | POA: Diagnosis not present

## 2017-06-29 DIAGNOSIS — Z87891 Personal history of nicotine dependence: Secondary | ICD-10-CM | POA: Insufficient documentation

## 2017-06-29 DIAGNOSIS — E669 Obesity, unspecified: Secondary | ICD-10-CM | POA: Insufficient documentation

## 2017-06-29 DIAGNOSIS — I252 Old myocardial infarction: Secondary | ICD-10-CM | POA: Insufficient documentation

## 2017-06-29 DIAGNOSIS — E1122 Type 2 diabetes mellitus with diabetic chronic kidney disease: Secondary | ICD-10-CM | POA: Insufficient documentation

## 2017-06-29 DIAGNOSIS — R296 Repeated falls: Secondary | ICD-10-CM | POA: Insufficient documentation

## 2017-06-29 DIAGNOSIS — Z794 Long term (current) use of insulin: Secondary | ICD-10-CM | POA: Insufficient documentation

## 2017-06-29 DIAGNOSIS — I5022 Chronic systolic (congestive) heart failure: Secondary | ICD-10-CM | POA: Diagnosis not present

## 2017-06-29 DIAGNOSIS — Z6832 Body mass index (BMI) 32.0-32.9, adult: Secondary | ICD-10-CM | POA: Diagnosis not present

## 2017-06-29 DIAGNOSIS — Z955 Presence of coronary angioplasty implant and graft: Secondary | ICD-10-CM | POA: Insufficient documentation

## 2017-06-29 DIAGNOSIS — I200 Unstable angina: Secondary | ICD-10-CM | POA: Insufficient documentation

## 2017-06-29 DIAGNOSIS — Z9884 Bariatric surgery status: Secondary | ICD-10-CM | POA: Diagnosis not present

## 2017-06-29 DIAGNOSIS — I208 Other forms of angina pectoris: Secondary | ICD-10-CM | POA: Diagnosis present

## 2017-06-29 DIAGNOSIS — R079 Chest pain, unspecified: Secondary | ICD-10-CM

## 2017-06-29 DIAGNOSIS — Z9861 Coronary angioplasty status: Secondary | ICD-10-CM | POA: Diagnosis not present

## 2017-06-29 DIAGNOSIS — I2089 Other forms of angina pectoris: Secondary | ICD-10-CM | POA: Diagnosis present

## 2017-06-29 HISTORY — PX: LEFT HEART CATH AND CORONARY ANGIOGRAPHY: CATH118249

## 2017-06-29 HISTORY — PX: CORONARY BALLOON ANGIOPLASTY: CATH118233

## 2017-06-29 LAB — GLUCOSE, CAPILLARY
GLUCOSE-CAPILLARY: 107 mg/dL — AB (ref 65–99)
GLUCOSE-CAPILLARY: 94 mg/dL (ref 65–99)

## 2017-06-29 LAB — POCT ACTIVATED CLOTTING TIME: ACTIVATED CLOTTING TIME: 224 s

## 2017-06-29 SURGERY — LEFT HEART CATH AND CORONARY ANGIOGRAPHY
Anesthesia: Moderate Sedation

## 2017-06-29 MED ORDER — HYDRALAZINE HCL 50 MG PO TABS
50.0000 mg | ORAL_TABLET | Freq: Three times a day (TID) | ORAL | Status: DC
Start: 2017-06-29 — End: 2017-06-30
  Administered 2017-06-29 – 2017-06-30 (×2): 50 mg via ORAL
  Filled 2017-06-29 (×2): qty 1

## 2017-06-29 MED ORDER — CLOPIDOGREL BISULFATE 300 MG PO TABS
ORAL_TABLET | ORAL | Status: AC
  Filled 2017-06-29: qty 1

## 2017-06-29 MED ORDER — ACETAMINOPHEN 325 MG PO TABS
650.0000 mg | ORAL_TABLET | ORAL | Status: DC | PRN
  Administered 2017-06-29: 650 mg via ORAL
  Filled 2017-06-29: qty 2

## 2017-06-29 MED ORDER — CLOPIDOGREL BISULFATE 75 MG PO TABS
ORAL_TABLET | ORAL | Status: DC | PRN
  Administered 2017-06-29: 300 mg via ORAL

## 2017-06-29 MED ORDER — CLOPIDOGREL BISULFATE 75 MG PO TABS
75.0000 mg | ORAL_TABLET | Freq: Every day | ORAL | Status: DC
  Administered 2017-06-30: 75 mg via ORAL
  Filled 2017-06-29: qty 1

## 2017-06-29 MED ORDER — CITALOPRAM HYDROBROMIDE 20 MG PO TABS
40.0000 mg | ORAL_TABLET | Freq: Every day | ORAL | Status: DC
  Administered 2017-06-30: 40 mg via ORAL
  Filled 2017-06-29: qty 2

## 2017-06-29 MED ORDER — ASPIRIN 81 MG PO CHEW
81.0000 mg | CHEWABLE_TABLET | Freq: Every day | ORAL | Status: DC
  Administered 2017-06-30: 81 mg via ORAL
  Filled 2017-06-29: qty 1

## 2017-06-29 MED ORDER — SODIUM CHLORIDE 0.9 % IV SOLN: INTRAVENOUS | Status: AC

## 2017-06-29 MED ORDER — GABAPENTIN 300 MG PO CAPS
300.0000 mg | ORAL_CAPSULE | Freq: Two times a day (BID) | ORAL | Status: DC
  Administered 2017-06-29 – 2017-06-30 (×2): 300 mg via ORAL
  Filled 2017-06-29 (×2): qty 1

## 2017-06-29 MED ORDER — MIDAZOLAM HCL 2 MG/2ML IJ SOLN
INTRAMUSCULAR | Status: DC | PRN
  Administered 2017-06-29: 1 mg via INTRAVENOUS

## 2017-06-29 MED ORDER — VERAPAMIL HCL 2.5 MG/ML IV SOLN
INTRAVENOUS | Status: DC | PRN
  Administered 2017-06-29: 2.5 mg via INTRAVENOUS

## 2017-06-29 MED ORDER — PANTOPRAZOLE SODIUM 40 MG PO TBEC
40.0000 mg | DELAYED_RELEASE_TABLET | Freq: Two times a day (BID) | ORAL | Status: DC
Start: 2017-06-29 — End: 2017-06-30
  Administered 2017-06-29 – 2017-06-30 (×2): 40 mg via ORAL
  Filled 2017-06-29 (×2): qty 1

## 2017-06-29 MED ORDER — MIDAZOLAM HCL 2 MG/2ML IJ SOLN
INTRAMUSCULAR | Status: AC
  Filled 2017-06-29: qty 2

## 2017-06-29 MED ORDER — ROSUVASTATIN CALCIUM 5 MG PO TABS
5.0000 mg | ORAL_TABLET | ORAL | Status: DC
  Administered 2017-06-30: 5 mg via ORAL
  Filled 2017-06-29: qty 1

## 2017-06-29 MED ORDER — CELECOXIB 200 MG PO CAPS
200.0000 mg | ORAL_CAPSULE | Freq: Two times a day (BID) | ORAL | Status: DC | PRN
  Filled 2017-06-29: qty 1

## 2017-06-29 MED ORDER — METOPROLOL SUCCINATE ER 100 MG PO TB24
100.0000 mg | ORAL_TABLET | Freq: Every day | ORAL | Status: DC
  Filled 2017-06-29: qty 1

## 2017-06-29 MED ORDER — HEPARIN (PORCINE) IN NACL 1000-0.9 UT/500ML-% IV SOLN
INTRAVENOUS | Status: AC
  Filled 2017-06-29: qty 1000

## 2017-06-29 MED ORDER — DILTIAZEM HCL ER COATED BEADS 120 MG PO CP24: 120.0000 mg | ORAL_CAPSULE | Freq: Every day | ORAL | Status: DC

## 2017-06-29 MED ORDER — ASPIRIN 81 MG PO CHEW
81.0000 mg | CHEWABLE_TABLET | ORAL | Status: AC
  Administered 2017-06-29: 81 mg via ORAL

## 2017-06-29 MED ORDER — SODIUM CHLORIDE 0.9% FLUSH: 3.0000 mL | INTRAVENOUS | Status: DC | PRN

## 2017-06-29 MED ORDER — PROMETHAZINE HCL 25 MG PO TABS: 25.0000 mg | ORAL_TABLET | Freq: Four times a day (QID) | ORAL | Status: DC | PRN

## 2017-06-29 MED ORDER — CITALOPRAM HYDROBROMIDE 40 MG PO TABS: 40.0000 mg | ORAL_TABLET | Freq: Every day | ORAL | Status: DC

## 2017-06-29 MED ORDER — RANOLAZINE ER 500 MG PO TB12
500.0000 mg | ORAL_TABLET | Freq: Two times a day (BID) | ORAL | Status: DC
  Administered 2017-06-29 – 2017-06-30 (×2): 500 mg via ORAL
  Filled 2017-06-29 (×3): qty 1

## 2017-06-29 MED ORDER — LOSARTAN POTASSIUM 50 MG PO TABS
100.0000 mg | ORAL_TABLET | Freq: Every day | ORAL | Status: DC
  Administered 2017-06-30: 100 mg via ORAL
  Filled 2017-06-29: qty 2

## 2017-06-29 MED ORDER — IOPAMIDOL (ISOVUE-300) INJECTION 61%
INTRAVENOUS | Status: DC | PRN
  Administered 2017-06-29: 75 mL via INTRA_ARTERIAL

## 2017-06-29 MED ORDER — ONDANSETRON HCL 4 MG/2ML IJ SOLN: 4.0000 mg | Freq: Four times a day (QID) | INTRAMUSCULAR | Status: DC | PRN

## 2017-06-29 MED ORDER — SODIUM CHLORIDE 0.9 % IV SOLN: 250.0000 mL | INTRAVENOUS | Status: DC | PRN

## 2017-06-29 MED ORDER — LEVOTHYROXINE SODIUM 25 MCG PO TABS
25.0000 ug | ORAL_TABLET | Freq: Every day | ORAL | Status: DC
  Administered 2017-06-30: 25 ug via ORAL
  Filled 2017-06-29: qty 1

## 2017-06-29 MED ORDER — HEPARIN SODIUM (PORCINE) 1000 UNIT/ML IJ SOLN
INTRAMUSCULAR | Status: DC | PRN
  Administered 2017-06-29 (×2): 4000 [IU] via INTRAVENOUS

## 2017-06-29 MED ORDER — NITROGLYCERIN 0.4 MG SL SUBL
0.4000 mg | SUBLINGUAL_TABLET | SUBLINGUAL | Status: DC | PRN
Start: 2017-06-29 — End: 2017-06-30

## 2017-06-29 MED ORDER — ADULT MULTIVITAMIN W/MINERALS CH
1.0000 | ORAL_TABLET | Freq: Every day | ORAL | Status: DC
  Administered 2017-06-30: 1 via ORAL
  Filled 2017-06-29: qty 1

## 2017-06-29 MED ORDER — SODIUM CHLORIDE 0.9% FLUSH: 3.0000 mL | Freq: Two times a day (BID) | INTRAVENOUS | Status: DC

## 2017-06-29 MED ORDER — FENTANYL CITRATE (PF) 100 MCG/2ML IJ SOLN
INTRAMUSCULAR | Status: AC
  Filled 2017-06-29: qty 2

## 2017-06-29 MED ORDER — ALPRAZOLAM 0.5 MG PO TABS
0.2500 mg | ORAL_TABLET | Freq: Four times a day (QID) | ORAL | Status: DC | PRN
  Administered 2017-06-29 – 2017-06-30 (×2): 0.5 mg via ORAL
  Filled 2017-06-29 (×2): qty 1

## 2017-06-29 MED ORDER — FENTANYL CITRATE (PF) 100 MCG/2ML IJ SOLN
INTRAMUSCULAR | Status: DC | PRN
  Administered 2017-06-29 (×2): 50 ug via INTRAVENOUS

## 2017-06-29 MED ORDER — VERAPAMIL HCL 2.5 MG/ML IV SOLN
INTRAVENOUS | Status: AC
  Filled 2017-06-29: qty 2

## 2017-06-29 MED ORDER — ASPIRIN 81 MG PO CHEW
CHEWABLE_TABLET | ORAL | Status: AC
  Filled 2017-06-29: qty 1

## 2017-06-29 MED ORDER — HEPARIN SODIUM (PORCINE) 1000 UNIT/ML IJ SOLN
INTRAMUSCULAR | Status: AC
  Filled 2017-06-29: qty 1

## 2017-06-29 MED ORDER — SODIUM CHLORIDE 0.9 % IV SOLN
INTRAVENOUS | Status: DC
  Administered 2017-06-29: 08:00:00 via INTRAVENOUS

## 2017-06-29 MED ORDER — SODIUM CHLORIDE 0.9% FLUSH
3.0000 mL | Freq: Two times a day (BID) | INTRAVENOUS | Status: DC
  Administered 2017-06-29 – 2017-06-30 (×2): 3 mL via INTRAVENOUS

## 2017-06-29 SURGICAL SUPPLY — 17 items
BALLN TREK RX 2.5X12 (BALLOONS) ×4
BALLOON TREK RX 2.5X12 (BALLOONS) ×1 IMPLANT
BAND CMPR LRG ZPHR (HEMOSTASIS) ×2
BAND COMPRESS 30CM LRG LONG (HEMOSTASIS) ×1
BAND ZEPHYR COMPRESS 30 LONG (HEMOSTASIS) ×1 IMPLANT
CATH INFINITI 5FR TG (CATHETERS) ×3 IMPLANT
CATH INFINITI JR4 5F (CATHETERS) ×2 IMPLANT
CATH LAUNCHER 6FR EBU3.5 (CATHETERS) ×3 IMPLANT
DEVICE INFLAT 30 PLUS (MISCELLANEOUS) ×2 IMPLANT
KIT MANI 3VAL PERCEP (MISCELLANEOUS) ×4 IMPLANT
NDL PERC 21GX4CM (NEEDLE) IMPLANT
NEEDLE PERC 21GX4CM (NEEDLE) ×4 IMPLANT
PACK CARDIAC CATH (CUSTOM PROCEDURE TRAY) ×4 IMPLANT
SHEATH RAIN RADIAL 21G 6FR (SHEATH) ×2 IMPLANT
WIRE HITORQ VERSACORE ST 145CM (WIRE) ×2 IMPLANT
WIRE INTUITION PROPEL ST 180CM (WIRE) ×2 IMPLANT
WIRE ROSEN-J .035X260CM (WIRE) ×3 IMPLANT

## 2017-06-29 NOTE — Progress Notes (Signed)
I stopped diltiazem due to bradycardia.

## 2017-06-29 NOTE — Progress Notes (Signed)
Pt. Bled from wrist at 12:50, 2 ml air added to TR band. Then at 12: 55 pm 2 ml additional air added for slow pulsatilea bleed under TR band. Dr. Fletcher Anon here now and made aware of issue. Total 14 ml air now in TR band without any further bleeding issue. No new orders at present.

## 2017-06-29 NOTE — Progress Notes (Signed)
TR Band completely off; sterile 2x2 and tegaderm to site.No edema, drainage, hematoma, ecchymosis at site. Pt. Ambulated to BR, the tx self to bed. Pt. tx now to room 255 via bed, on monitor, with RN escort. Stable for tx. Tele: SB.

## 2017-06-29 NOTE — Interval H&P Note (Signed)
History and Physical Interval Note:  06/29/2017 11:31 AM  [name]  has presented today for surgery, with the diagnosis of LT Cath    Chest pain      4 HRS HYDRATION PRIOR TO CATH   Pt arrive 7a  The various methods of treatment have been discussed with the patient and family. After consideration of risks, benefits and other options for treatment, the patient has consented to  Procedure(s): LEFT HEART CATH AND CORONARY ANGIOGRAPHY (Left) as a surgical intervention .  The patient's history has been reviewed, patient examined, no change in status, stable for surgery.  I have reviewed the patient's chart and labs.  Questions were answered to the patient's satisfaction.     [name]

## 2017-06-30 ENCOUNTER — Telehealth: Payer: Self-pay | Admitting: Physician Assistant

## 2017-06-30 ENCOUNTER — Telehealth: Payer: Self-pay | Admitting: *Deleted

## 2017-06-30 ENCOUNTER — Encounter: Payer: Self-pay | Admitting: Cardiovascular Disease

## 2017-06-30 DIAGNOSIS — N183 Chronic kidney disease, stage 3 unspecified: Secondary | ICD-10-CM

## 2017-06-30 DIAGNOSIS — I252 Old myocardial infarction: Secondary | ICD-10-CM | POA: Diagnosis not present

## 2017-06-30 DIAGNOSIS — I25110 Atherosclerotic heart disease of native coronary artery with unstable angina pectoris: Secondary | ICD-10-CM | POA: Diagnosis not present

## 2017-06-30 DIAGNOSIS — Z955 Presence of coronary angioplasty implant and graft: Secondary | ICD-10-CM | POA: Diagnosis not present

## 2017-06-30 DIAGNOSIS — I130 Hypertensive heart and chronic kidney disease with heart failure and stage 1 through stage 4 chronic kidney disease, or unspecified chronic kidney disease: Secondary | ICD-10-CM | POA: Diagnosis not present

## 2017-06-30 DIAGNOSIS — I200 Unstable angina: Secondary | ICD-10-CM | POA: Diagnosis not present

## 2017-06-30 DIAGNOSIS — N184 Chronic kidney disease, stage 4 (severe): Secondary | ICD-10-CM | POA: Diagnosis not present

## 2017-06-30 DIAGNOSIS — I5022 Chronic systolic (congestive) heart failure: Secondary | ICD-10-CM | POA: Diagnosis not present

## 2017-06-30 LAB — CBC
HEMATOCRIT: 33.6 % — AB (ref 35.0–47.0)
Hemoglobin: 11.2 g/dL — ABNORMAL LOW (ref 12.0–16.0)
MCH: 27.7 pg (ref 26.0–34.0)
MCHC: 33.3 g/dL (ref 32.0–36.0)
MCV: 83.3 fL (ref 80.0–100.0)
Platelets: 152 10*3/uL (ref 150–440)
RBC: 4.04 MIL/uL (ref 3.80–5.20)
RDW: 13.8 % (ref 11.5–14.5)
WBC: 7.4 10*3/uL (ref 3.6–11.0)

## 2017-06-30 LAB — BASIC METABOLIC PANEL
Anion gap: 8 (ref 5–15)
BUN: 22 mg/dL — AB (ref 6–20)
CHLORIDE: 112 mmol/L — AB (ref 101–111)
CO2: 20 mmol/L — ABNORMAL LOW (ref 22–32)
Calcium: 8.5 mg/dL — ABNORMAL LOW (ref 8.9–10.3)
Creatinine, Ser: 1.51 mg/dL — ABNORMAL HIGH (ref 0.44–1.00)
GFR calc Af Amer: 46 mL/min — ABNORMAL LOW (ref >60)
GFR calc non Af Amer: 40 mL/min — ABNORMAL LOW (ref >60)
GLUCOSE: 88 mg/dL (ref 65–99)
POTASSIUM: 4.1 mmol/L (ref 3.5–5.1)
Sodium: 140 mmol/L (ref 135–145)

## 2017-06-30 MED ORDER — ASPIRIN 81 MG PO CHEW: 81.0000 mg | CHEWABLE_TABLET | Freq: Every day | ORAL | 11 refills | Status: DC

## 2017-06-30 MED ORDER — CLOPIDOGREL BISULFATE 75 MG PO TABS: 75.0000 mg | ORAL_TABLET | Freq: Every day | ORAL | 2 refills | Status: DC

## 2017-06-30 MED ORDER — METOPROLOL SUCCINATE ER 50 MG PO TB24
50.0000 mg | ORAL_TABLET | Freq: Every day | ORAL | Status: DC
  Administered 2017-06-30: 50 mg via ORAL
  Filled 2017-06-30: qty 1

## 2017-06-30 MED ORDER — METOPROLOL SUCCINATE ER 50 MG PO TB24: 50.0000 mg | ORAL_TABLET | Freq: Every day | ORAL | 3 refills | Status: DC

## 2017-06-30 NOTE — Telephone Encounter (Signed)
-----   Message from Rise Mu, PA-C sent at 06/30/2017  8:16 AM EDT ----- Can you please schedule this patient for a BMP on 6/3. Dx: CKD stage III

## 2017-06-30 NOTE — Telephone Encounter (Signed)
Spoke with patient and reviewed that we would like her to have labs done next Monday at the [facility] Entrance of the hospital. She verbalized understanding, was agreeable with plan, and had no further questions at this time.

## 2017-06-30 NOTE — Plan of Care (Signed)
  Problem: Clinical Measurements: Goal: Ability to maintain clinical measurements within normal limits will improve Outcome: Progressing Goal: Diagnostic test results will improve Outcome: Progressing Goal: Cardiovascular complication will be avoided Outcome: Progressing   Problem: Coping: Goal: Level of anxiety will decrease Outcome: Progressing   Problem: Pain Managment: Goal: General experience of comfort will improve Outcome: Progressing   Problem: Safety: Goal: Ability to remain free from injury will improve Outcome: Progressing   Problem: Skin Integrity: Goal: Risk for impaired skin integrity will decrease Outcome: Progressing   Problem: Cardiovascular: Goal: Vascular access site(s) Level 0-1 will be maintained Outcome: Progressing

## 2017-06-30 NOTE — Progress Notes (Signed)
Dr. Saunders Revel made aware of  Low HR/ changes made to med/ will monitor

## 2017-06-30 NOTE — Progress Notes (Signed)
Discharge instructions explained to pt/ verbalized an understanding/ iv and tele removed/ will transport off unit via wheelchair.  

## 2017-06-30 NOTE — Telephone Encounter (Signed)
TCM....  Patient is being discharged later today  They saw Christell Faith   They are scheduled to see Dr Caryl Comes  on June 13th   They were seen for PCI   They need to be seen within 2 weeks  Pt is not on the  wait list   Please call

## 2017-06-30 NOTE — Discharge Summary (Addendum)
Discharge Summary    Patient ID: [name]  MRN: [id number], DOB/AGE: Oct 24, 1970 47 y.o.  Admit Date: 06/29/2017 Discharge Date: 06/30/2017  Primary Care Provider: [name], MD Primary Cardiologist: Dr. [name], MD  Discharge Diagnoses    Principal Problem:   Unstable angina [facility]) Active Problems:   Effort angina ([place])   Allergies Allergies  Allergen Reactions  . Lipitor [Atorvastatin] Other (See Comments)    Leg pains  . Tramadol Other (See Comments)    Mouth feels like it's on fire     History of Present Illness     47 year old female with history of CAD status post PCI to the LAD and D1 as detailed below, chronic systolic CHF secondary to ischemic cardia myopathy, hypertensive heart disease, CKD stage III-IV, hyperlipidemia, prior medication noncompliance, recurrent strokes and falls with concern for possible MS, obesity status post gastric bypass surgery, and aortic arch aneurysm who presented to [facility] on 06/30/2017 for outpatient diagnostic cath.  Prior cardiac catheterization in 2005 showed mild nonobstructive disease with normal LV systolic function.  Nuclear stress test in 2012 in the setting of chest pain showed no ischemia. She underwent CT in 11/2013 showing right aortic arch with a large retro-esophageal vascular segment with persistent dorsal aortic segment giving off an aberrant left subclavian artery, and is s/p left carotid to subclavian artery bypass with subclavian artery ligation. She was admitted to [facility] in October 2016 with chest pain. Troponin with a peak of 0.04. She underwent nuclear stress test that was overall suboptimal due to GI uptake, EF 45-54%, no ST segmanet changes, low risk. She continued to have chest pain and underwent cardiac cath that showed proximal LAD 85% stenosis s/p PCI/DES (staged procedure given her renal dysfunction), distal LAD 30% stenosis, proximal LCx 30%.  She was readmitted to the hospital in 06/2015 with unstable angina  with cardiac catheterization showing significant one-vessel CAD with a widely patent LAD stent.  There was significant stenosis affecting the D1 of 80%.  She underwent successful PCI/DES to the D1 segment.  She has continued to have presyncopal/syncopal/falling episodes that have been felt to be in the setting of orthostasis.  Outpatient cardiac monitoring including Holter and event monitor have been unrevealing.  Multiple carotid artery ultrasounds have been unrevealing.  Most recent echocardiogram from 03/2017 showed EF of 60 to 65%, (improved from a prior level of 45%), no regional wall motion abnormalities, normal LV diastolic function, normal size left atrium, normal RVSF, normal PASP.  She has had prior MRIs of the brain that have been suspicious for MS however lumbar puncture was unrevealing.  She has been referred to the balance clinic at [facility] though she has not been able to follow-up with them.  She was most recently admitted to [facility] in 04/2017 with syncope in the setting of orthostasis, hypoglycemia, and possible acute CVA.  CT head was negative.  MRI of the brain showed patchy acute versus subacute ischemic infarcts.  Carotid Dopplers were without significant stenosis.  Recent echocardiogram the month prior as detailed above.  She was discharged on aspirin and high intensity statin.  She has since followed up with neurology and endocrinology.  Following this hospital admission, many of her antihypertensives were decreased secondary to orthostasis.  In this setting, the patient started to develop worsening chest pain.  However upon restarting Cardizem CD 120 mg daily and Toprol 100 mg daily her chest pain symptoms significantly improved.  She was seen in the office on  06/18/2017 with complaints of generalized fatigue and intermittent chest pain.  She preferred to medically manage at that time and she was started on Ranexa 500 mg twice daily.  She was noted to be intolerant of Imdur secondary to headaches.   She she sent [place] a message on 5/21 noting return of chest pain and wanted to proceed with diagnostic cardiac catheterization.   Hospital Course     Consultants: none  Precath labs showed a serum creatinine of 2.02.  Patient was brought into the hospital on the early morning hours of 5/28 for precath hydration.  She underwent diagnostic left heart cath on 5/28 that showed significant one-vessel CAD with patent stents in the mid LAD and D1.  There was significant disease and the distal LAD close to the apex at 70% stenosis.  There was also severe ostial stenosis in the D2 which was new estimated at 95% stenosis.  It was noted that the previous mid LAD stent was placed just distal to the origin of D2.  She was also noted to have proximal circumflex 40% stenosis.  Mildly elevated LVEDP.  LV gram was not performed secondary to CKD.  She underwent successful PTCA of the ostial D2.  Dislocation was felt to not be optimal for stent in the future due to angulation from the LAD and presence of stent along the mid LAD just distal to the origin of D2.  A total of 75 mL of contrast was used for the case.  It was recommended the patient be placed on dual antiplatelet therapy for at least 1 month.  She was kept overnight for IV hydration secondary to CKD.  Follow-up BMP on 5/29 showed improved serum creatinine of 1.51.  Potassium 4.1, CBC with hemoglobin 11.2.  Patient has ambulated without issues.  No complications from the right radial cath site.  Overnight and this morning she was noted to be bradycardic with heart rate in the mid 40s bpm.  Her Toprol-XL was decreased to 50 mg daily.  Her Cardizem CD was discontinued.  The patient's right radial cath site has been examined is healing well without issues at this time. The patient has been seen by Dr. Saunders Revel and felt to be stable for discharge today. All follow up appointments have been made. Discharge medications are listed below. Prescriptions have been reviewed with the  patient and sent in to their pharmacy.  _____________  Discharge Vitals Blood pressure (!) 154/70, pulse (!) 54, temperature 98 F (36.7 C), temperature source Oral, resp. rate 18, height _0  (1.575 m), weight 168 lb 8 oz (76.4 kg), SpO2 100 %.  Filed Weights   06/29/17 0725 06/29/17 1639 06/30/17 0436  Weight: 176 lb (79.8 kg) 169 lb 15.6 oz (77.1 kg) 168 lb 8 oz (76.4 kg)    Labs & Radiologic Studies    CBC Recent Labs    06/30/17 0317  WBC 7.4  HGB 11.2*  HCT 33.6*  MCV 83.3  PLT 536   Basic Metabolic Panel Recent Labs    06/30/17 0317  NA 140  K 4.1  CL 112*  CO2 20*  GLUCOSE 88  BUN 22*  CREATININE 1.51*  CALCIUM 8.5*   Liver Function Tests No results for input(s): AST, ALT, ALKPHOS, BILITOT, PROT, ALBUMIN in the last 72 hours. No results for input(s): LIPASE, AMYLASE in the last 72 hours. Cardiac Enzymes No results for input(s): CKTOTAL, CKMB, CKMBINDEX, TROPONINI in the last 72 hours. BNP Invalid input(s): POCBNP D-Dimer No results for  input(s): DDIMER in the last 72 hours. Hemoglobin A1C No results for input(s): HGBA1C in the last 72 hours. Fasting Lipid Panel No results for input(s): CHOL, HDL, LDLCALC, TRIG, CHOLHDL, LDLDIRECT in the last 72 hours. Thyroid Function Tests No results for input(s): TSH, T4TOTAL, T3FREE, THYROIDAB in the last 72 hours.  Invalid input(s): FREET3 _____________  Dg Shoulder Left  Result Date: 06/12/2017 IMPRESSION: No acute abnormality noted. Electronically Signed   By: Inez Catalina M.D.   On: 06/12/2017 17:26   Dg Hip Unilat With Pelvis 2-3 Views Left  Result Date: 06/12/2017 IMPRESSION: Mild degenerative change without acute abnormality. Electronically Signed   By: Inez Catalina M.D.   On: 06/12/2017 17:28   Dg Femur Min 2 Views Left  Result Date: 06/12/2017 IMPRESSION: No acute abnormality seen. Electronically Signed   By: Inez Catalina M.D.   On: 06/12/2017 17:29    Diagnostic Studies/Procedures   LHC  06/29/2017: Coronary Findings   Diagnostic  Dominance: Right  Left Main  Vessel is angiographically normal.  Left Anterior Descending  Mid LAD lesion 5% stenosed  Mid LAD lesion is 5% stenosed. The lesion was previously treated.  Dist LAD lesion 70% stenosed  Dist LAD lesion is 70% stenosed.  First Diagonal Branch  Ost 1st Diag lesion 40% stenosed  Ost 1st Diag lesion is 40% stenosed.  1st Diag lesion 0% stenosed  Previously placed 1st Diag stent (unknown type) is widely patent.  Second Diagonal [employer] 2nd Diag to 2nd Diag lesion 95% stenosed  Ost 2nd Diag to 2nd Diag lesion is 95% stenosed.  Left Circumflex  Prox Cx lesion 40% stenosed  Prox Cx lesion is 40% stenosed.  Second Obtuse Marginal Branch  The vessel exhibits minimal luminal irregularities.  Third Obtuse Marginal Branch  The vessel exhibits minimal luminal irregularities.  Right Coronary Artery  There is mild diffuse disease throughout the vessel.  Right Posterior Descending Artery  There is mild disease in the vessel.  Right Posterior Atrioventricular Branch  There is mild disease in the vessel.  First Right Posterolateral  There is mild disease in the vessel.  Intervention   Ost 2nd Diag to 2nd Diag lesion  Angioplasty  Lesion length: 10 mm. Balloon angioplasty was performed using a BALLOON TREK RX 2.5X12. Maximum pressure: 6 atm. Inflation time: 60 sec.  Post-Intervention Lesion Assessment  The intervention was successful. Pre-interventional TIMI flow is 3. Post-intervention TIMI flow is 3. Treated lesion length: 10 mm. No complications occurred at this lesion.  There is a 30% residual stenosis post intervention.  Coronary Diagrams   Diagnostic Diagram       Post-Intervention Diagram         Conclusion     Prox Cx lesion is 40% stenosed.  Previously placed 1st Diag stent (unknown type) is widely patent.  Mid LAD lesion is 5% stenosed.  Ost 1st Diag lesion is 40% stenosed.  Ost 2nd  Diag to 2nd Diag lesion is 95% stenosed.  Dist LAD lesion is 70% stenosed.  Post intervention, there is a 30% residual stenosis.  Balloon angioplasty was performed using a BALLOON TREK RX 2.5X12.   1.  Significant one-vessel coronary artery disease with patent stents in the mid LAD and first diagonal.  There is significant disease in the distal LAD close to the apex.  There is also severe ostial stenosis in the second diagonal which is new.  The  previous LAD stent is placed just distal to the origin of the second  diagonal. 2.  Mildly elevated left ventricular end-diastolic pressure.  Left ventricular angiography was not performed due to chronic kidney disease. 3.  Successful balloon angioplasty of the ostial second diagonal without stent placement.  Not optimal location to place and stent in the future due to angulation from the LAD and the presence of a stent in the LAD in that area.  Recommendations: Dual antiplatelet therapy for at least 1 month. Aggressive treatment of risk factors. Hydrate overnight due to chronic kidney disease.  A total of 75 mL of contrast was used.    _____________  Disposition   Pt is being discharged home today in good condition.  Follow-up Plans & Appointments    Follow-up Information    Deboraha Sprang, MD Follow up on 07/15/2017.   Specialty:  Cardiology Why:  Appointment time: 10:45 AM Contact information: [place] [id number] [phone]        Rise Mu, PA-C Follow up on 07/09/2017.   Specialties:  Librarian, academic, Cardiology, Radiology Why:  Appointment 2: 30 PM Contact information: [address] [place] [address] [phone]          Discharge Instructions    AMB Referral to Cardiac Rehabilitation - Phase II   Complete by:  As directed    Diagnosis:   Coronary Stents Stable Angina     Amb Referral to Cardiac Rehabilitation   Complete by:  As directed     Diagnosis:  PTCA   Call MD for:  difficulty breathing, headache or visual disturbances   Complete by:  As directed    Call MD for:  extreme fatigue   Complete by:  As directed    Call MD for:  hives   Complete by:  As directed    Call MD for:  persistant dizziness or light-headedness   Complete by:  As directed    Call MD for:  persistant nausea and vomiting   Complete by:  As directed    Call MD for:  redness, tenderness, or signs of infection (pain, swelling, redness, odor or green/yellow discharge around incision site)   Complete by:  As directed    Call MD for:  severe uncontrolled pain   Complete by:  As directed    Call MD for:  temperature >100.4   Complete by:  As directed    Diet - low sodium heart healthy   Complete by:  As directed    Increase activity slowly   Complete by:  As directed       Discharge Medications   Allergies as of 06/30/2017      Reactions   Lipitor [atorvastatin] Other (See Comments)   Leg pains   Tramadol Other (See Comments)   Mouth feels like it's on fire      Medication List    STOP taking these medications   diltiazem 120 MG 24 hr capsule Commonly known as:  CARDIZEM CD     TAKE these medications   ALPRAZolam 0.5 MG tablet Commonly known as:  XANAX Take 0.5-1 tablets (0.25-0.5 mg total) by mouth every 6 (six) hours as needed for anxiety. What changed:  when to take this   aspirin 81 MG chewable tablet Chew 1 tablet (81 mg total) by mouth daily. Start taking on:  07/01/2017   celecoxib 200 MG capsule Commonly known as:  CELEBREX Take 200 mg by mouth 2 (two) times daily as needed for moderate pain.   citalopram  40 MG tablet Commonly known as:  CELEXA Take 1 tablet (40 mg total) by mouth daily.   clopidogrel 75 MG tablet Commonly known as:  PLAVIX Take 1 tablet (75 mg total) by mouth daily with breakfast. Start taking on:  07/01/2017   gabapentin 300 MG capsule Commonly known as:  NEURONTIN Take 1 capsule (300 mg total) by  mouth 2 (two) times daily.   hydrALAZINE 50 MG tablet Commonly known as:  APRESOLINE Take 1 tablet (50 mg total) by mouth 3 (three) times daily.   hyoscyamine 0.125 MG SL tablet Commonly known as:  LEVSIN SL Place 1 tablet (0.125 mg total) under the tongue every 6 (six) hours as needed. What changed:  reasons to take this   insulin lispro 100 UNIT/ML KiwkPen Commonly known as:  HUMALOG KWIKPEN Take between 4 and 12 units before each meal according to sliding scale What changed:  additional instructions   levothyroxine 25 MCG tablet Commonly known as:  SYNTHROID, LEVOTHROID Take 1 tablet (25 mcg total) daily before breakfast by mouth.   losartan 100 MG tablet Commonly known as:  COZAAR Take 0.5 tablets (50 mg total) by mouth daily. What changed:  how much to take   metoprolol succinate 50 MG 24 hr tablet Commonly known as:  TOPROL-XL Take 1 tablet (50 mg total) by mouth daily. Take with or immediately following a meal. Start taking on:  07/01/2017 What changed:    medication strength  how much to take   multivitamin with minerals Tabs tablet Take 1 tablet by mouth daily.   nitroGLYCERIN 0.4 MG SL tablet Commonly known as:  NITROSTAT Place 1 tablet (0.4 mg total) under the tongue every 5 (five) minutes as needed for chest pain.   ONE TOUCH ULTRA 2 w/Device Kit Use to check blood sugar once a day. Dx. E11.9   pantoprazole 40 MG tablet Commonly known as:  PROTONIX Take 1 tablet (40 mg total) by mouth 2 (two) times daily.   promethazine 25 MG tablet Commonly known as:  PHENERGAN Take 25 mg by mouth every 6 (six) hours as needed for nausea or vomiting.   ranolazine 500 MG 12 hr tablet Commonly known as:  RANEXA Take 1 tablet (500 mg total) by mouth 2 (two) times daily.   rosuvastatin 5 MG tablet Commonly known as:  CRESTOR Take 5 mg by mouth 3 (three) times a week.      Aspirin prescribed at discharge?  Yes High Intensity Statin Prescribed? (Lipitor 40-74m or  Crestor 20-464m: Yes Beta Blocker Prescribed? Yes For EF <40%, was ACEI/ARB Prescribed? No: EF > 40% ADP Receptor Inhibitor Prescribed? (i.e. Plavix etc.-Includes Medically Managed Patients): Yes For EF <40%, Aldosterone Inhibitor Prescribed? No: EF > 40% Was EF assessed during THIS hospitalization? Yes Was Cardiac Rehab II ordered? (Included Medically managed Patients): Yes   Outstanding Labs/Studies   none  Duration of Discharge Encounter   Greater than 30 minutes including physician time.  Signed, RyRise MuPA-C [place]: ([phone]/29/2019, 9:31 AM

## 2017-06-30 NOTE — Progress Notes (Signed)
Cardiovascular and Pulmonary Nurse Navigator Note:  47 year old female with hx of CAD s/p PCI to the LAD and D1 in the past, chronic systolic CHF secondary to ischemic cardiomyopathy, HTN, CKD Stage III-IV, HLD, prior medication noncompliance, recurrent strokes and falls with concern for possible MS, obesity - s/p gastric bypass surgery, and aortic arch aneurysm who presented to [facility] for outpatient diagnostic cath.    Conclusion from Cardiac Cath performed on 06/29/2017 below:   Conclusion     Prox Cx lesion is 40% stenosed.  Previously placed 1st Diag stent (unknown type) is widely patent.  Mid LAD lesion is 5% stenosed.  Ost 1st Diag lesion is 40% stenosed.  Ost 2nd Diag to 2nd Diag lesion is 95% stenosed.  Dist LAD lesion is 70% stenosed.  Post intervention, there is a 30% residual stenosis.  Balloon angioplasty was performed using a BALLOON TREK RX 2.5X12.  1. Significant one-vessel coronary artery disease with patent stents in the mid LAD and first diagonal. There is significant disease in the distal LAD close to the apex. There is also severe ostial stenosis in the second diagonal which is new. The previous LAD stent is placed just distal to the origin of the second diagonal. 2. Mildly elevated left ventricular end-diastolic pressure. Left ventricular angiography was not performed due to chronic kidney disease. 3. Successful balloon angioplasty of the ostial second diagonal without stent placement. Not optimal location to place and stent in the future due to angulation from the LAD and the presence of a stent in the LAD in that area.   Patient is being discharged home today.  Patient sitting up on side of bed dressed and ready to go home.    Discussed modifiable risk factors including controlling blood pressure, cholesterol, and blood sugar; following heart healthy diet; maintaining healthy weight; exercise; and smoking cessation, if applicable. ?Note: Patient is a  former every day smoker.??? ? Discussed cardiac medications including rationale for taking, mechanisms of action, and side effects. Stressed the importance of taking medications as prescribed.  ? Discussed emergency plan for heart attack symptoms. Patient verbalized understanding of need to call 911 and not to drive herself or have her family drive her to the ER if having cardiac symptoms / chest pain.  ? Heart healthy diet of low sodium, low fat, low cholesterol heart healthy diet discussed.  ? Smoking Cessation - Patient is a former smoker. ?  Exercise - Benefits of exercised discussed. Informed patient cardiologist has referred her to outpatient Cardiac Rehab. An overview of the program was provided. Patient declines to participate in Cardiac Rehab.   ?  Roanna Epley, RN, BSN, [facility]? [facility] [facility]  Cardiovascular &?Pulmonary Nurse Navigator  Direct Line: [phone]  Department Phone #: [phone] Fax: [phone]? Email Address: [email or web address] ?

## 2017-06-30 NOTE — Telephone Encounter (Signed)
Pt currently admitted at this time.

## 2017-06-30 NOTE — Telephone Encounter (Signed)
Patient contacted regarding discharge from [facility] on Pending.  Patient understands to follow up with provider Christell Faith PA on 07/09/17 at 2:30PM at [facility]. Patient understands discharge instructions? N/A Patient understands medications and regiment? N/A Patient understands to bring all medications to this visit? Yes  Patient is still admitted at this time. Reviewed appointment information and advised to please call us if she should have any questions regarding discharge instructions or medications. She verbalized understanding with no further questions at this time.

## 2017-06-30 NOTE — Progress Notes (Signed)
Progress Note  Patient Name: [name] Date of Encounter: 06/30/2017  Primary Cardiologist: [name]  Subjective   No further chest pain. SOB improved. Bradycardic into the 40s bpm. Asymptomatic. Renal function 2.02-->1.51 following LHC. No cath site complications. Has ambulated in the room without issues.   Inpatient Medications    Scheduled Meds: . aspirin  81 mg Oral Daily  . citalopram  40 mg Oral Daily  . clopidogrel  75 mg Oral Q breakfast  . gabapentin  300 mg Oral BID  . hydrALAZINE  50 mg Oral TID  . levothyroxine  25 mcg Oral QAC breakfast  . losartan  100 mg Oral Daily  . metoprolol succinate  100 mg Oral Daily  . multivitamin with minerals  1 tablet Oral Daily  . pantoprazole  40 mg Oral BID  . ranolazine  500 mg Oral BID  . rosuvastatin  5 mg Oral Once per day on Mon Wed Fri  . sodium chloride flush  3 mL Intravenous Q12H   Continuous Infusions: . sodium chloride     PRN Meds: sodium chloride, acetaminophen, ALPRAZolam, celecoxib, nitroGLYCERIN, ondansetron (ZOFRAN) IV, promethazine, sodium chloride flush   Vital Signs    Vitals:   06/29/17 1639 06/29/17 2002 06/30/17 0436 06/30/17 0440  BP: (!) 147/73 125/61  140/75  Pulse: (!) 59 (!) 51  (!) 44  Resp: 18 18  18   Temp: 98.2 F (36.8 C) 98.2 F (36.8 C)  98 F (36.7 C)  TempSrc: Oral Oral  Oral  SpO2: 98% 99%  100%  Weight: 169 lb 15.6 oz (77.1 kg)  168 lb 8 oz (76.4 kg)   Height: 5\' 2"  (1.575 m)       Intake/Output Summary (Last 24 hours) at 06/30/2017 0754 Last data filed at 06/30/2017 3545 Gross per 24 hour  Intake 966 ml  Output 700 ml  Net 266 ml   Filed Weights   06/29/17 0725 06/29/17 1639 06/30/17 0436  Weight: 176 lb (79.8 kg) 169 lb 15.6 oz (77.1 kg) 168 lb 8 oz (76.4 kg)    Telemetry    Sinus bradycardia, 40s bpm to NSR in the 60s bpm - Personally Reviewed  ECG    Sinus bradycardia, 43 bpm, prior anterior infarct, nonspecific lateral st/t changes - Personally  Reviewed  Physical Exam   GEN: No acute distress.   Neck: No JVD. Cardiac: Bradycardic, no murmurs, rubs, or gallops. Right radial cath site without bleeding, bruising, swelling, erythema, warmth, or TTP. Radial pulse 2+. Respiratory: Clear to auscultation bilaterally.  GI: Soft, nontender, non-distended.   MS: No edema; No deformity. Neuro:  Alert and oriented x 3; Nonfocal.  Psych: Normal affect.  Labs    Chemistry Recent Labs  Lab 06/24/17 1115 06/30/17 0317  NA 136 140  K 3.7 4.1  CL 104 112*  CO2 23 20*  GLUCOSE 133* 88  BUN 26* 22*  CREATININE 2.02* 1.51*  CALCIUM 8.7* 8.5*  GFRNONAA 28* 40*  GFRAA 32* 46*  ANIONGAP 9 8     Hematology Recent Labs  Lab 06/24/17 1115 06/30/17 0317  WBC 10.2 7.4  RBC 4.58 4.04  HGB 12.7 11.2*  HCT 37.7 33.6*  MCV 82.2 83.3  MCH 27.7 27.7  MCHC 33.7 33.3  RDW 13.6 13.8  PLT 219 152    Cardiac EnzymesNo results for input(s): TROPONINI in the last 168 hours. No results for input(s): TROPIPOC in the last 168 hours.   BNPNo results for input(s): BNP,  PROBNP in the last 168 hours.   DDimer No results for input(s): DDIMER in the last 168 hours.   Radiology    No results found.  Cardiac Studies   LHC 06/29/2017: Conclusion     Prox Cx lesion is 40% stenosed.  Previously placed 1st Diag stent (unknown type) is widely patent.  Mid LAD lesion is 5% stenosed.  Ost 1st Diag lesion is 40% stenosed.  Ost 2nd Diag to 2nd Diag lesion is 95% stenosed.  Dist LAD lesion is 70% stenosed.  Post intervention, there is a 30% residual stenosis.  Balloon angioplasty was performed using a BALLOON TREK RX 2.5X12.   1.  Significant one-vessel coronary artery disease with patent stents in the mid LAD and first diagonal.  There is significant disease in the distal LAD close to the apex.  There is also severe ostial stenosis in the second diagonal which is new.  The  previous LAD stent is placed just distal to the origin of the  second diagonal. 2.  Mildly elevated left ventricular end-diastolic pressure.  Left ventricular angiography was not performed due to chronic kidney disease. 3.  Successful balloon angioplasty of the ostial second diagonal without stent placement.  Not optimal location to place and stent in the future due to angulation from the LAD and the presence of a stent in the LAD in that area.  Recommendations: Dual antiplatelet therapy for at least 1 month. Aggressive treatment of risk factors. Hydrate overnight due to chronic kidney disease.  A total of 75 mL of contrast was used.     Patient Profile     47 y.o. female with history of CAD status post PCI to the LAD and D1 as detailed below, chronic systolic CHF secondary to ischemic cardia myopathy, hypertensive heart disease, CKD stage III-IV, hyperlipidemia, prior medication noncompliance, recurrent strokes and falls with concern for possible MS, obesity status post gastric bypass surgery, and aortic arch aneurysm who presented to [facility] on 5/28 for diagnostic LHC.   Assessment & Plan    1. CAD of native coronary arteries without angina: -Status post PTCA of the D2 as above -DAPT with ASA 81 mg and Plavix 75 mg daily for at least the next 30 days -Ranexa 500 mg bid -Intolerant to Imdur 2/2 headache -Decrease Toprol XL to 50 mg daily as below given bradycardia -Aggressive risk factor modification and secondary prevention -Cardiac rehab  2. Sinus bradycardia: -Asymptomatic -Decrease Toprol XL to 50 mg daily -Cardizem held  3. CKD stage III: -SCr stable following LHC -Will need recheck BMP on 6/3, message sent to the office  4. HLD: -Goal LDL < 70 -Crestor 5 mg, 3 times weekly -Consider adding Zetia as an outpatient  5. HTN: -Well controlled -Continue losartan and Toprol   6. Recurrent strokes: -Has appointment with EP 6/13 for possible ILR  For questions or updates, please contact [facility] Please consult [email or web address] for  contact info under Cardiology/STEMI.    Signed, Christell Faith, PA-C [place] Pager: [phone] 06/30/2017, 7:54 AM

## 2017-06-30 NOTE — Progress Notes (Signed)
Cardiovascular and Pulmonary Nurse Navigator Note:    Rounded on patient to talk with patient about Cardiac Rehab.  Patient reported that she has never participated in Cardiac Rehab before.  Patient drifting off to sleep while this RN was talking.  Will attempt to round again prior to patient being discharged today.    Roanna Epley, RN, BSN, [facility] Cardiovascular and Pulmonary Nurse Navigator

## 2017-07-08 NOTE — Progress Notes (Signed)
Cardiology Office Note Date:  07/09/2017  Patient ID:  [name], [name] 1970/09/28, MRN [id number] PCP:  [name], MD  Cardiologist:  Dr. [name], MD    Chief Complaint: Hospital follow-up  History of Present Illness: [name] is a 47 y.o. female with history of CAD status post PCI to the LAD and D1 as well as PTCA of D2 as detailed below, chronic systolic CHF secondary to ischemic cardia myopathy, hypertensive heart disease, CKD stage III-IV, hyperlipidemia, prior medication noncompliance, recurrent strokes and falls with concern for possible MS, obesity status post gastric bypass surgery, and aortic arch aneurysm who presents for hospital follow-up after recent admission to [facility] following outpatient diagnostic cardiac catheterization as detailed below.  Prior cardiac catheterization in 2005 showed mild nonobstructive disease with normal LV systolic function.Nuclear stress test in 2012 in the setting of chest pain showed no ischemia. She underwent CT in 11/2013 showing right aortic arch with a large retro-esophageal vascular segment with persistent dorsal aortic segment giving off an aberrant left subclavian artery, and is s/p left carotid to subclavian artery bypass with subclavian artery ligation. She was admitted to [facility] in October 2016 with chest pain. Troponin with a peak of 0.04. She underwent nuclear stress test that was overall suboptimal due to GI uptake, EF 45-54%, no ST segmanet changes, low risk. She continued to have chest pain and underwent cardiac cath that showed proximal LAD 85% stenosis s/p PCI/DES (staged procedure given her renal dysfunction), distal LAD 30% stenosis, proximal LCx 30%.She was readmitted to the hospital in 06/2015 with unstable angina with cardiac catheterization showing significant one-vessel CAD with a widely patent LAD stent. There was significant stenosis affecting the D1 of 80%. She underwent successful PCI/DES to the D1 segment. She  has continued to have presyncopal/syncopal/falling episodes that have been felt to be in the setting of orthostasis. Outpatient cardiac monitoring including Holter and event monitor have been unrevealing. Multiple carotid artery ultrasounds have been unrevealing. Echocardiogram from 03/2017 showed EF of 60 to 65%, (improved from a prior level of 45%), no regional wall motion abnormalities, normal LV diastolic function, normal size left atrium, normal RVSF, normal PASP. She has had prior MRIs of the brain that have been suspicious for MS however lumbar puncture was unrevealing. She has been referred to the balance clinic at [facility] though she has not been able to follow-up with them. She was admitted to [facility] in 04/2017 with syncope in the setting of orthostasis, hypoglycemia, and possible acute CVA. CT head was negative. MRI of the brain showed patchy acute versus subacute ischemic infarcts. Carotid Dopplers were without significant stenosis. Recent echocardiogram the month prior as detailed above. She was discharged on aspirin and high intensity statin. She has since followed up with neurology and endocrinology. Following this hospital admission, many of her antihypertensives were decreased secondary to orthostasis. In this setting, the patient started to develop worsening chest pain.  However upon restarting Cardizem CD 120 mg daily and Toprol 100 mg daily her chest pain symptoms significantly improved.  She was seen in the office on 06/18/2017 with complaints of generalized fatigue and intermittent chest pain.  She preferred to medically manage at that time and she was started on Ranexa 500 mg twice daily.  She was noted to be intolerant of Imdur secondary to headaches.  She she sent [place] a message on 5/21 noting return of chest pain and wanted to proceed with diagnostic cardiac catheterization.  Patient was brought into  the hospital on 5/28 for prehydration secondary to precath labs showing a serum  creatinine of 2.02.  She underwent diagnostic LHC on 5/28 that showed significant one-vessel CAD with patent stents in the mid LAD and D1.  There was significant disease in the distal LAD close to the apex at 70% stenosis.  There was also severe ostial stenosis and the D2 which was new and estimated at 95% stenosis.  It was noted that the previous mid LAD stent was placed just distal to the origin of D2.  She was also noted to have proximal LCx stenosis estimated at 40%.  LVEDP was mildly elevated.  LV gram was not performed secondary to underlying CKD.  She underwent successful PTCA of the ostial D2.  This location was felt to not be optimal for stenting in the future due to angulation from the LAD and presence of stent along the mid LAD just distal to the origin of D2.  A total of 75 mL's of contrast was used for the case.  Follow-up serum creatinine post cath on 5/29 was noted to be 1.51 with a potassium of 4.1, hemoglobin 11.2.  She was noted to be bradycardic with heart rate in the mid 40s bpm leading to decreasing of her Toprol-XL to 50 mg daily and Cardizem CD was discontinued at discharge.  She was placed on dual antiplatelet therapy with aspirin and Plavix.  It was recommended the patient, and shortly after discharge for recheck renal function however this was not completed for unclear reasons.  She comes in doing well today.  Following her PTCA as above she did not have any symptoms concerning for angina up until approximately 3 days prior when she noticed a mild "ache" of her chest.  Symptoms do not feel similar or as strong as her prior angina.  This ache is nonexertional and short-lived, lasting only a couple of minutes before spontaneously resolving.  She reports compliance with aspirin 81 mg daily, Plavix 75 mg daily, hydralazine 50 mg 3 times daily, losartan 100 mg daily, Toprol-XL 50 mg daily, Ranexa 500 mg twice daily, and Crestor 5 mg 3 times weekly.  She has not needed any sublingual  nitroglycerin since her recent cardiac cath.  No complications from the right radial cardiac cath site.  She has been busy this past week taking care of her son's child.  Her dizziness is significantly improved.  She has not had any further syncopal episodes.  She does report intermittent palpitations with a self-reported heart rate of 145 bpm 2 nights prior with associated dizziness.  She has not had any symptoms since.  She is scheduled to see EP on 6/13 for evaluation of possible loop recorder in the setting of her ongoing palpitations with recurrent stroke.  No recent falls.  No BRBPR or melena.  Blood pressure typically in the 130s to 140s at home.  She reports she is trying to lose weight.  Weight is down 5 pounds today from her most recent office visit on 06/18/2017.   Past Medical History:  Diagnosis Date  . Anemia    iron deficiency anemia  . Aortic arch aneurysm ([place])   . Bipolar disorder ([place])   . BRCA negative 2014  . Colon polyp   . Family history of breast cancer    BRCA neg 2014  . Gastric ulcer 04/27/2011  . Headache    Migraines  . Malignant melanoma of skin of scalp ([place])   . MI, acute, non ST segment elevation (  [place])   . Morbid obesity ([place])    a. s/p gastric bypass.  . Myocardial infarction ([place]) 06/03/2015  . Neuromuscular disorder ([place])   . S/P drug eluting coronary stent placement 06/04/2015  . Sepsis ([place]) 03/18/2017    Past Surgical History:  Procedure Laterality Date  . APPENDECTOMY    . CARDIAC CATHETERIZATION N/A 11/09/2014   Procedure: Coronary Angiography;  Surgeon: Minna Merritts, MD;  Location: [place] CV LAB;  Service: Cardiovascular;  Laterality: N/A;  . CARDIAC CATHETERIZATION N/A 11/12/2014   Procedure: Coronary Stent Intervention;  Surgeon: Isaias Cowman, MD;  Location: [place] CV LAB;  Service: Cardiovascular;  Laterality: N/A;  . CARDIAC CATHETERIZATION N/A 04/18/2015   Procedure: Left Heart Cath and Coronary Angiography;   Surgeon: Minna Merritts, MD;  Location: [place] CV LAB;  Service: Cardiovascular;  Laterality: N/A;  . CARDIAC CATHETERIZATION Left 06/04/2015   Procedure: Left Heart Cath and Coronary Angiography;  Surgeon: Wellington Hampshire, MD;  Location: [place] CV LAB;  Service: Cardiovascular;  Laterality: Left;  . CARDIAC CATHETERIZATION N/A 06/04/2015   Procedure: Coronary Stent Intervention;  Surgeon: Wellington Hampshire, MD;  Location: [place] CV LAB;  Service: Cardiovascular;  Laterality: N/A;  . CESAREAN SECTION  2001  . CHOLECYSTECTOMY N/A 11/18/2016   Procedure: LAPAROSCOPIC CHOLECYSTECTOMY WITH INTRAOPERATIVE CHOLANGIOGRAM;  Surgeon: Christene Lye, MD;  Location: [facility] ORS;  Service: General;  Laterality: N/A;  . COLONOSCOPY WITH PROPOFOL N/A 04/27/2016   Procedure: COLONOSCOPY WITH PROPOFOL;  Surgeon: [name], MD;  Location: [place];  Service: Endoscopy;  Laterality: N/A;  . CORONARY ANGIOPLASTY    . CORONARY BALLOON ANGIOPLASTY N/A 06/29/2017   Procedure: CORONARY BALLOON ANGIOPLASTY;  Surgeon: Wellington Hampshire, MD;  Location: [place] CV LAB;  Service: Cardiovascular;  Laterality: N/A;  . DILATION AND CURETTAGE OF UTERUS    . ESOPHAGOGASTRODUODENOSCOPY (EGD) WITH PROPOFOL N/A 09/14/2014   Procedure: ESOPHAGOGASTRODUODENOSCOPY (EGD) WITH PROPOFOL;  Surgeon: Josefine Class, MD;  Location: [facility] ENDOSCOPY;  Service: Endoscopy;  Laterality: N/A;  . ESOPHAGOGASTRODUODENOSCOPY (EGD) WITH PROPOFOL N/A 04/27/2016   Procedure: ESOPHAGOGASTRODUODENOSCOPY (EGD) WITH PROPOFOL;  Surgeon: [name], MD;  Location: [place];  Service: Endoscopy;  Laterality: N/A;  Diabetic - oral meds  . GASTRIC BYPASS  09/2009   [facility]   . Left Carotid to sublcavian artery bypass w/ subclavian artery ligation     a. Performed @ [place].  . LEFT HEART CATH AND CORONARY ANGIOGRAPHY Left 06/29/2017   Procedure: LEFT HEART CATH AND CORONARY ANGIOGRAPHY;   Surgeon: Wellington Hampshire, MD;  Location: [place] CV LAB;  Service: Cardiovascular;  Laterality: Left;  Marland Kitchen MELANOMA EXCISION  2016   Dr. Evorn Gong  . Tat Momoli  2002  . RIGHT OOPHORECTOMY    . SHOULDER ARTHROSCOPY WITH OPEN ROTATOR CUFF REPAIR Right 01/07/2016   Procedure: SHOULDER ARTHROSCOPY WITH DEBRIDMENT, SUBACHROMIAL DECOMPRESSION;  Surgeon: Corky Mull, MD;  Location: [facility] ORS;  Service: Orthopedics;  Laterality: Right;  . SHOULDER ARTHROSCOPY WITH OPEN ROTATOR CUFF REPAIR Right 03/16/2017   Procedure: SHOULDER ARTHROSCOPY WITH OPEN ROTATOR CUFF REPAIR POSSIBLE BICEPS TENODESIS;  Surgeon: Corky Mull, MD;  Location: [facility] ORS;  Service: Orthopedics;  Laterality: Right;  . TRIGGER FINGER RELEASE Right     Middle Finger    Current Meds  Medication Sig  . ALPRAZolam (XANAX) 0.5 MG tablet Take 0.5-1 tablets (0.25-0.5 mg total) by mouth every 6 (six) hours as needed for anxiety. (Patient  taking differently: Take 0.25-0.5 mg by mouth 2 (two) times daily as needed for anxiety. )  . aspirin 81 MG chewable tablet Chew 1 tablet (81 mg total) by mouth daily.  . Blood Glucose Monitoring Suppl (ONE TOUCH ULTRA 2) w/Device KIT Use to check blood sugar once a day. Dx. E11.9  . celecoxib (CELEBREX) 200 MG capsule Take 200 mg by mouth 2 (two) times daily as needed for moderate pain.   . citalopram (CELEXA) 40 MG tablet Take 1 tablet (40 mg total) by mouth daily.  . clopidogrel (PLAVIX) 75 MG tablet Take 1 tablet (75 mg total) by mouth daily with breakfast.  . gabapentin (NEURONTIN) 300 MG capsule Take 1 capsule (300 mg total) by mouth 2 (two) times daily.  . hydrALAZINE (APRESOLINE) 50 MG tablet Take 1 tablet (50 mg total) by mouth 3 (three) times daily.  . hyoscyamine (LEVSIN SL) 0.125 MG SL tablet Place 1 tablet (0.125 mg total) under the tongue every 6 (six) hours as needed. (Patient taking differently: Place 0.125 mg under the tongue every 6 (six) hours as needed for cramping. )  .  insulin lispro (HUMALOG KWIKPEN) 100 UNIT/ML KiwkPen Take between 4 and 12 units before each meal according to sliding scale (Patient taking differently: Take between 4 and 12 units before each meal according to sliding scale as needed)  . levothyroxine (SYNTHROID, LEVOTHROID) 25 MCG tablet Take 1 tablet (25 mcg total) daily before breakfast by mouth.  . losartan (COZAAR) 100 MG tablet Take 0.5 tablets (50 mg total) by mouth daily. (Patient taking differently: Take 100 mg by mouth daily. )  . metoprolol succinate (TOPROL-XL) 50 MG 24 hr tablet Take 1 tablet (50 mg total) by mouth daily. Take with or immediately following a meal.  . Multiple Vitamin (MULTIVITAMIN WITH MINERALS) TABS tablet Take 1 tablet by mouth daily.  . nitroGLYCERIN (NITROSTAT) 0.4 MG SL tablet Place 1 tablet (0.4 mg total) under the tongue every 5 (five) minutes as needed for chest pain.  . pantoprazole (PROTONIX) 40 MG tablet Take 1 tablet (40 mg total) by mouth 2 (two) times daily.  . promethazine (PHENERGAN) 25 MG tablet Take 25 mg by mouth every 6 (six) hours as needed for nausea or vomiting.   . ranolazine (RANEXA) 500 MG 12 hr tablet Take 1 tablet (500 mg total) by mouth 2 (two) times daily.  . rosuvastatin (CRESTOR) 5 MG tablet Take 5 mg by mouth 3 (three) times a week.     Allergies:   Lipitor [atorvastatin] and Tramadol   Social History:  The patient  reports that she quit smoking about 22 years ago. Her smoking use included cigarettes. She has never used smokeless tobacco. She reports that she does not drink alcohol or use drugs.   Family History:  The patient's family history includes Anxiety disorder in her father and mother; Bipolar disorder in her mother; Breast cancer in her other; Breast cancer (age of onset: 66) in her maternal aunt and maternal aunt; Colon cancer in her cousin and father; Depression in her father, mother, and sister; Diabetes in her father and sister; Heart disease in her father and mother;  Hyperlipidemia in her mother and sister; Hypertension in her father, mother, sister, and sister; Kidney disease in her father and sister; Ovarian cancer in her cousin; Skin cancer in her father; Stroke in her father; Thyroid nodules in her sister.  ROS:   Review of Systems  Constitutional: Positive for malaise/fatigue. Negative for chills, diaphoresis, fever and weight  loss.  HENT: Negative for congestion.   Eyes: Negative for discharge and redness.  Respiratory: Negative for cough, hemoptysis, sputum production, shortness of breath and wheezing.   Cardiovascular: Positive for chest pain and palpitations. Negative for orthopnea, claudication, leg swelling and PND.  Gastrointestinal: Negative for abdominal pain, blood in stool, heartburn, melena, nausea and vomiting.  Genitourinary: Negative for hematuria.  Musculoskeletal: Negative for falls and myalgias.  Skin: Negative for rash.  Neurological: Positive for dizziness and weakness. Negative for tingling, tremors, sensory change, speech change, focal weakness and loss of consciousness.       Improved dizziness  Endo/Heme/Allergies: Does not bruise/bleed easily.  Psychiatric/Behavioral: Negative for substance abuse. The patient is not nervous/anxious.   All other systems reviewed and are negative.    PHYSICAL EXAM:  VS:  BP 140/90 (BP Location: Left Arm, Patient Position: Sitting, Cuff Size: Normal)   Pulse 82   Ht '5\' 2"'  (1.575 m)   Wt 171 lb (77.6 kg)   BMI 31.28 kg/m  BMI: Body mass index is 31.28 kg/m.  Physical Exam  Constitutional: She is oriented to person, place, and time. She appears well-developed and well-nourished.  HENT:  Head: Normocephalic and atraumatic.  Eyes: Right eye exhibits no discharge. Left eye exhibits no discharge.  Neck: Normal range of motion. No JVD present.  Cardiovascular: Normal rate, regular rhythm, S1 normal, S2 normal and normal heart sounds. Exam reveals no distant heart sounds, no friction rub, no  midsystolic click and no opening snap.  No murmur heard. Pulses:      Posterior tibial pulses are 2+ on the right side, and 2+ on the left side.  Right radial cardiac cath site well healing without bruising, bleeding, erythema, warmth, swelling, or tenderness to palpation.  Right radial pulse 2+.  Pulmonary/Chest: Effort normal and breath sounds normal. No respiratory distress. She has no decreased breath sounds. She has no wheezes. She has no rales. She exhibits no tenderness.  Abdominal: Soft. She exhibits no distension. There is no tenderness.  Musculoskeletal: She exhibits no edema.  Neurological: She is alert and oriented to person, place, and time.  Skin: Skin is warm and dry. No cyanosis. Nails show no clubbing.  Psychiatric: She has a normal mood and affect. Her speech is normal and behavior is normal. Judgment and thought content normal.      EKG:  Was ordered and interpreted by me today. Shows NSR, 82 bpm, nonspecific ST-T changes  Recent Labs: 03/19/2017: Magnesium 2.3; TSH 1.013 04/09/2017: ALT 35 06/18/2017: BNP 317.4 06/30/2017: BUN 22; Creatinine, Ser 1.51; Hemoglobin 11.2; Platelets 152; Potassium 4.1; Sodium 140  04/10/2017: Cholesterol 134; HDL 29; LDL Cholesterol 88; Total CHOL/HDL Ratio 4.6; Triglycerides 85; VLDL 17   Estimated Creatinine Clearance: 43.9 mL/min (A) (by C-G formula based on SCr of 1.51 mg/dL (H)).   Wt Readings from Last 3 Encounters:  07/09/17 171 lb (77.6 kg)  06/30/17 168 lb 8 oz (76.4 kg)  06/18/17 176 lb (79.8 kg)     Other studies reviewed: Additional studies/records reviewed today include: summarized above  ASSESSMENT AND PLAN:  1. CAD of the native coronary arteries with stable angina: She has done well following her most recent PTCA.  She has noted a mild ache over the past 3 days that is nonexertional and does not feel like her prior angina.  She will restart Cardizem 120 mg daily (previously held secondary to bradycardia).  Continue  Toprol-XL 50 mg daily as well as Ranexa 500 mg twice daily.  She will remain on dual antiplatelet therapy with aspirin 81 mg and Plavix 75 mg daily for at least 30 days following PTCA.  We will need to discuss with interventional cardiology regarding duration of dual antiplatelet therapy beyond this.  Aggressive risk factor modification.  Should she have recurrent angina, stenting of the ostial D2 is not optimal given location needed to place the stent due to angulation from the LAD and presents of prior stent in the mid LAD.  We will need to continue to escalate medical therapy as indicated.    2. Recurrent strokes: She has been referred to EP for evaluation of possible loop recorder with +/-TEE.  She was previously managed on full dose aspirin per neurology, however this was decreased to 81 mg daily with the addition of Plavix following her recent PTCA as above.  Continue to recommend aggressive blood pressure, heart rate, and lipid control.  3. CKD stage III: Check BMP to assess for stability.  4. Hypertension: Blood pressure is elevated today at 140/90.  We have added Cardizem CD 120 mg daily as above.  Otherwise, she will continue hydralazine 50 mg 3 times daily and Toprol-XL 50 mg daily as well as losartan 100 mg daily.  5. Cramps: Check electrolytes and CK.  Recommend she hydrate.  6. Hyperlipidemia: Seems to be tolerating Crestor 5 mg 3 times weekly.  She does note some cramping as above.  Check lytes and CK.  7. Falls: No recent episodes of fall.  She has been referred to the [facility] balance clinic.  There is some concern for possible MS.  Per PCP.  Disposition: F/u with Dr. Caryl Comes on 6/13 as above and keep regularly scheduled appointment with Dr. [name] on 7/9.    Current medicines are reviewed at length with the patient today.  The patient did not have any concerns regarding medicines.  Signed, Christell Faith, PA-C 07/09/2017 2:37 PM     [address] [place], [place] [address] [phone]

## 2017-07-09 ENCOUNTER — Encounter: Payer: Self-pay | Admitting: Physician Assistant

## 2017-07-09 ENCOUNTER — Ambulatory Visit (INDEPENDENT_AMBULATORY_CARE_PROVIDER_SITE_OTHER): Payer: Medicare Other | Admitting: Physician Assistant

## 2017-07-09 VITALS — BP 140/90 | HR 82 | Ht 62.0 in | Wt 171.0 lb

## 2017-07-09 DIAGNOSIS — E785 Hyperlipidemia, unspecified: Secondary | ICD-10-CM

## 2017-07-09 DIAGNOSIS — N183 Chronic kidney disease, stage 3 unspecified: Secondary | ICD-10-CM

## 2017-07-09 DIAGNOSIS — I10 Essential (primary) hypertension: Secondary | ICD-10-CM

## 2017-07-09 DIAGNOSIS — I200 Unstable angina: Secondary | ICD-10-CM

## 2017-07-09 DIAGNOSIS — I25118 Atherosclerotic heart disease of native coronary artery with other forms of angina pectoris: Secondary | ICD-10-CM | POA: Diagnosis not present

## 2017-07-09 DIAGNOSIS — R252 Cramp and spasm: Secondary | ICD-10-CM | POA: Diagnosis not present

## 2017-07-09 DIAGNOSIS — I639 Cerebral infarction, unspecified: Secondary | ICD-10-CM | POA: Diagnosis not present

## 2017-07-09 MED ORDER — DILTIAZEM HCL ER COATED BEADS 120 MG PO CP24: 120.0000 mg | ORAL_CAPSULE | Freq: Every day | ORAL | Status: DC

## 2017-07-09 NOTE — Patient Instructions (Addendum)
Medication Instructions: - Your physician has recommended you make the following change in your medication:   1) RESTART cardizem (diltiazem) 120 mg- take 1 capsule (120 mg) by mouth once daily   Labwork: - Your physician recommends that you have lab work today: BMP/ Magnesium  Procedures/Testing: - none ordered  Follow-Up: - as scheduled: 1) Dr. Caryl Comes- 6/13 @ 10:45 am 2) Dr. [name]- 7/9 @ 9:20 am  Any Additional Special Instructions Will Be Listed Below (If Applicable).     If you need a refill on your cardiac medications before your next appointment, please call your pharmacy.

## 2017-07-10 LAB — BASIC METABOLIC PANEL
BUN / CREAT RATIO: 10 (ref 9–23)
BUN: 20 mg/dL (ref 6–24)
CHLORIDE: 110 mmol/L — AB (ref 96–106)
CO2: 17 mmol/L — ABNORMAL LOW (ref 20–29)
Calcium: 8.7 mg/dL (ref 8.7–10.2)
Creatinine, Ser: 1.97 mg/dL — ABNORMAL HIGH (ref 0.57–1.00)
GFR calc non Af Amer: 29 mL/min/{1.73_m2} — ABNORMAL LOW (ref >59)
GFR, EST AFRICAN AMERICAN: 34 mL/min/{1.73_m2} — AB (ref >59)
Glucose: 45 mg/dL — ABNORMAL LOW (ref 65–99)
POTASSIUM: 4.4 mmol/L (ref 3.5–5.2)
SODIUM: 143 mmol/L (ref 134–144)

## 2017-07-10 LAB — MAGNESIUM: Magnesium: 1.6 mg/dL (ref 1.6–2.3)

## 2017-07-12 ENCOUNTER — Other Ambulatory Visit: Payer: Self-pay | Admitting: *Deleted

## 2017-07-12 MED ORDER — MAGNESIUM OXIDE 400 MG PO TABS: 400.0000 mg | ORAL_TABLET | Freq: Every day | ORAL | 3 refills | Status: DC

## 2017-07-15 ENCOUNTER — Ambulatory Visit (INDEPENDENT_AMBULATORY_CARE_PROVIDER_SITE_OTHER): Payer: Medicare Other | Admitting: Internal Medicine

## 2017-07-15 ENCOUNTER — Encounter: Payer: Self-pay | Admitting: Internal Medicine

## 2017-07-15 VITALS — BP 136/80 | HR 52 | Ht 62.0 in | Wt 175.2 lb

## 2017-07-15 DIAGNOSIS — I10 Essential (primary) hypertension: Secondary | ICD-10-CM | POA: Diagnosis not present

## 2017-07-15 DIAGNOSIS — I5022 Chronic systolic (congestive) heart failure: Secondary | ICD-10-CM | POA: Diagnosis not present

## 2017-07-15 NOTE — Progress Notes (Signed)
ELECTROPHYSIOLOGY CONSULT NOTE  Patient ID: [name], MRN: [id number], DOB/AGE: 1970/03/25 47 y.o. Admit date: (Not on file) Date of Consult: 07/15/2017  Primary Physician: [name], MD Primary Cardiologist: Ashok Norris M [id number] [name] is a 47 y.o. female who is being seen today for the evaluation of ILR  at the request of RD/TG   HPI [name] is a 47 y.o. 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   DATE TEST EF   10/16 Echo   45-50 %   10/16 LHC   % LAD DES  5/17 LHC  D1 DES  2/19 Echo  60-65%   5/19 LHC  D2 POBA, dLAD 70%>> med Rx     She has spells suspicious for MS and MRI showed suspicious lesions, although spinal tap unrevealing   Admitted 3.19 with syncope    Found to be orthostatic ( BP sys 123>>>78// HR 73>> 99 ) ,  MRI>> "patchy acute v subacute infarcts" perhaps watershed 2/2 ? Orthostatic hypotension   She has had significant problems with orthostatic intolerance.  She has heat intolerance shower intolerance.  She is not sure whether this precedes or postdates her gastric bypass surgery undertaken 11 years ago.  2/2 orthostasis, CCB and BB held but CP worsened and improved with resumption;  Ranolazine recently added.  Bradycardia prompted discontinuing of CCB   Past Medical History:  Diagnosis Date  . Anemia    iron deficiency anemia  . 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 Bipolar disorder ([place])   . BRCA negative 2014  . Colon polyp   . Family history of breast cancer    BRCA neg 2014  . Gastric ulcer 04/27/2011  . Headache    Migraines  . Malignant melanoma of skin of scalp ([place])   . MI, acute, non ST segment elevation ([place])   . Morbid obesity ([place])    a. s/p  gastric bypass.  . Myocardial infarction ([place]) 06/03/2015  . Neuromuscular disorder ([place])   . S/P drug eluting coronary stent placement 06/04/2015  . Sepsis ([place]) 03/18/2017      Surgical History:  Past Surgical History:  Procedure Laterality Date  . APPENDECTOMY    . CARDIAC CATHETERIZATION N/A 11/09/2014   Procedure: Coronary Angiography;  Surgeon: Minna Merritts, MD;  Location: [place] [facility];  Service: Cardiovascular;  Laterality: N/A;  . CARDIAC CATHETERIZATION N/A 11/12/2014   Procedure: Coronary Stent Intervention;  Surgeon: Isaias Cowman, MD;  Location: [place] [facility];  Service: Cardiovascular;  Laterality: N/A;  . CARDIAC CATHETERIZATION N/A 04/18/2015   Procedure: Left Heart Cath and Coronary Angiography;  Surgeon: Minna Merritts, MD;  Location: [place] [facility];  Service: Cardiovascular;  Laterality: N/A;  . CARDIAC CATHETERIZATION Left 06/04/2015   Procedure: Left Heart Cath and Coronary Angiography;  Surgeon: Wellington Hampshire, MD;  Location: [place] [facility];  Service: Cardiovascular;  Laterality: Left;  . CARDIAC CATHETERIZATION N/A 06/04/2015   Procedure: Coronary Stent Intervention;  Surgeon: Wellington Hampshire, MD;  Location: [place] [facility];  Service: Cardiovascular;  Laterality: N/A;  . CESAREAN SECTION  2001  . CHOLECYSTECTOMY N/A  11/18/2016   Procedure: LAPAROSCOPIC CHOLECYSTECTOMY WITH INTRAOPERATIVE CHOLANGIOGRAM;  Surgeon: Christene Lye, MD;  Location: [facility];  Service: General;  Laterality: N/A;  . COLONOSCOPY WITH PROPOFOL N/A 04/27/2016   Procedure: COLONOSCOPY WITH PROPOFOL;  Surgeon: [name], MD;  Location: [facility];  Service: Endoscopy;  Laterality: N/A;  . CORONARY ANGIOPLASTY    . CORONARY BALLOON ANGIOPLASTY N/A 06/29/2017   Procedure: CORONARY BALLOON ANGIOPLASTY;  Surgeon: Wellington Hampshire, MD;  Location: [place] [facility];  Service: Cardiovascular;  Laterality: N/A;  . DILATION AND CURETTAGE OF  UTERUS    . ESOPHAGOGASTRODUODENOSCOPY (EGD) WITH PROPOFOL N/A 09/14/2014   Procedure: ESOPHAGOGASTRODUODENOSCOPY (EGD) WITH PROPOFOL;  Surgeon: Josefine Class, MD;  Location: [facility];  Service: Endoscopy;  Laterality: N/A;  . ESOPHAGOGASTRODUODENOSCOPY (EGD) WITH PROPOFOL N/A 04/27/2016   Procedure: ESOPHAGOGASTRODUODENOSCOPY (EGD) WITH PROPOFOL;  Surgeon: [name], MD;  Location: [facility];  Service: Endoscopy;  Laterality: N/A;  Diabetic - oral meds  . GASTRIC BYPASS  09/2009   [facility]   . Left Carotid to sublcavian artery bypass w/ subclavian artery ligation     a. Performed @ [facility].  . LEFT HEART CATH AND CORONARY ANGIOGRAPHY Left 06/29/2017   Procedure: LEFT HEART CATH AND CORONARY ANGIOGRAPHY;  Surgeon: Wellington Hampshire, MD;  Location: [place] [facility];  Service: Cardiovascular;  Laterality: Left;  Marland Kitchen MELANOMA EXCISION  2016   Dr. Evorn Gong  . [place]  2002  . RIGHT OOPHORECTOMY    . SHOULDER ARTHROSCOPY WITH OPEN ROTATOR CUFF REPAIR Right 01/07/2016   Procedure: SHOULDER ARTHROSCOPY WITH DEBRIDMENT, SUBACHROMIAL DECOMPRESSION;  Surgeon: Corky Mull, MD;  Location: [facility];  Service: Orthopedics;  Laterality: Right;  . SHOULDER ARTHROSCOPY WITH OPEN ROTATOR CUFF REPAIR Right 03/16/2017   Procedure: SHOULDER ARTHROSCOPY WITH OPEN ROTATOR CUFF REPAIR POSSIBLE BICEPS TENODESIS;  Surgeon: Corky Mull, MD;  Location: [facility];  Service: Orthopedics;  Laterality: Right;  . TRIGGER FINGER RELEASE Right     [place]     Home Meds: Prior to Admission medications   Medication Sig Start Date End Date Taking? Authorizing Provider  ALPRAZolam Duanne Moron) 0.5 MG tablet Take 0.5-1 tablets (0.25-0.5 mg total) by mouth every 6 (six) hours as needed for anxiety. Patient taking differently: Take 0.25-0.5 mg by mouth 2 (two) times daily as needed for anxiety.  03/22/17   Loletha Grayer, MD  aspirin 81 MG chewable tablet Chew 1 tablet (81 mg  total) by mouth daily. 07/01/17   Dunn, Areta Haber, PA-C  Blood Glucose Monitoring Suppl (ONE TOUCH ULTRA 2) w/Device KIT Use to check blood sugar once a day. Dx. E11.9 03/18/17   [name], MD  celecoxib (CELEBREX) 200 MG capsule Take 200 mg by mouth 2 (two) times daily as needed for moderate pain.     [provider]  citalopram (CELEXA) 40 MG tablet Take 1 tablet (40 mg total) by mouth daily. 06/08/17   [name], MD  clopidogrel (PLAVIX) 75 MG tablet Take 1 tablet (75 mg total) by mouth daily with breakfast. 07/01/17   Dunn, Areta Haber, PA-C  diltiazem (CARDIZEM CD) 120 MG 24 hr capsule Take 1 capsule (120 mg total) by mouth daily. 07/09/17 10/07/17  Rise Mu, PA-C  gabapentin (NEURONTIN) 300 MG capsule Take 1 capsule (300 mg total) by mouth 2 (two) times daily. 06/04/17   [name], MD  hydrALAZINE (APRESOLINE) 50 MG tablet Take 1 tablet (50 mg total)  by mouth 3 (three) times daily. 06/18/17 09/16/17  Rise Mu, PA-C  hyoscyamine (LEVSIN SL) 0.125 MG SL tablet Place 1 tablet (0.125 mg total) under the tongue every 6 (six) hours as needed. Patient taking differently: Place 0.125 mg under the tongue every 6 (six) hours as needed for cramping.  02/01/17   [name], MD  insulin lispro (HUMALOG KWIKPEN) 100 UNIT/ML KiwkPen Take between 4 and 12 units before each meal according to sliding scale Patient taking differently: Take between 4 and 12 units before each meal according to sliding scale as needed 04/15/17   [name], MD  levothyroxine (SYNTHROID, LEVOTHROID) 25 MCG tablet Take 1 tablet (25 mcg total) daily before breakfast by mouth. 12/18/16   [name], MD  losartan (COZAAR) 100 MG tablet Take 1 tablet (100 mg) by mouth once daily    [provider]  magnesium oxide (MAG-OX) 400 MG tablet Take 1 tablet (400 mg total) by mouth daily. 07/12/17   Dunn, Areta Haber, PA-C  metoprolol succinate (TOPROL-XL) 50 MG 24 hr tablet Take 1 tablet (50 mg total) by mouth  daily. Take with or immediately following a meal. 07/01/17   Dunn, Areta Haber, PA-C  Multiple Vitamin (MULTIVITAMIN WITH MINERALS) TABS tablet Take 1 tablet by mouth daily. 04/14/17   Gouru, Illene Silver, MD  nitroGLYCERIN (NITROSTAT) 0.4 MG SL tablet Place 1 tablet (0.4 mg total) under the tongue every 5 (five) minutes as needed for chest pain. 06/02/17   Minna Merritts, MD  pantoprazole (PROTONIX) 40 MG tablet Take 1 tablet (40 mg total) by mouth 2 (two) times daily. 05/11/16   [name], MD  promethazine (PHENERGAN) 25 MG tablet Take 25 mg by mouth every 6 (six) hours as needed for nausea or vomiting.     [provider]  ranolazine (RANEXA) 500 MG 12 hr tablet Take 1 tablet (500 mg total) by mouth 2 (two) times daily. 06/18/17   Dunn, Areta Haber, PA-C  rosuvastatin (CRESTOR) 5 MG tablet Take 5 mg by mouth 3 (three) times a week.  04/27/17   [provider]    Allergies:  Allergies  Allergen Reactions  . Lipitor [Atorvastatin] Other (See Comments)    Leg pains  . Tramadol Other (See Comments)    Mouth feels like it's on fire    Social History   Socioeconomic History  . Marital status: Divorced    Spouse name: Not on file  . Number of children: 1  . Years of education: Not on file  . Highest education level: Not on file  Occupational History  . 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 Disabled through 08/02/2013 per forms 02/03/2013  Social Needs  . Financial resource strain: Not on file  . Food insecurity:    Worry: Not on file    Inability: Not on file  . Transportation needs:    Medical: Not on file    Non-medical: Not on file  Tobacco Use  . Smoking status: Former Smoker    Types: Cigarettes    Last attempt to quit: 08/31/1994    Years since quitting: 22.8  . Smokeless tobacco: Never Used  . Tobacco comment: quit 28 years ago  Substance and Sexual Activity  . Alcohol use: No     Alcohol/week: 0.0 oz  . Drug use: No  . Sexual activity: Not Currently    Birth control/protection:  None  Lifestyle  . Physical activity:    Days per week: Not on file    Minutes per session: Not on file  . Stress: Not on file  Relationships  . Social connections:    Talks on phone: Not on file    Gets together: Not on file    Attends religious service: Not on file    Active member of club or organization: Not on file    Attends meetings of clubs or organizations: Not on file    Relationship status: Not on file  . Intimate partner violence:    Fear of current or ex partner: Not on file    Emotionally abused: Not on file    Physically abused: Not on file    Forced sexual activity: Not on file  Other Topics Concern  . 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     Family History  Problem Relation Age of Onset  . Hypertension Mother   . Anxiety disorder Mother   . Depression Mother   . Bipolar disorder Mother   . Heart disease Mother   . Hyperlipidemia Mother   . Kidney disease Father   . Heart disease Father   . Hypertension Father   . Diabetes Father   . Stroke Father   . Colon cancer Father        dx in his 69's  . Anxiety disorder Father   . Depression Father   . Skin cancer Father   . Kidney disease Sister   . Thyroid nodules Sister   . Hypertension Sister   . Hypertension Sister   . Diabetes Sister   . Hyperlipidemia Sister   . Depression Sister   . Breast cancer Maternal Aunt 43  . Breast cancer Maternal Aunt 57  . Ovarian cancer Cousin   . Colon cancer Cousin   . Breast cancer Other   . Kidney cancer Neg Hx   . Bladder Cancer Neg Hx      ROS:  Please see the history of present illness.     All other systems reviewed and negative.    Physical Exam: Blood pressure 136/80, pulse (!) 52, height '5\' 2"'$  (1.575 m), weight 175 lb 4 oz (79.5 kg). General:  Well developed, well nourished female in no acute distress. Head: Normocephalic, atraumatic, sclera non-icteric, no xanthomas, nares are without discharge. EENT: normal  Lymph Nodes:  none Neck: Negative for carotid bruits. JVD not elevated. Back:without scoliosis kyphosis Lungs: Clear bilaterally to auscultation without wheezes, rales, or rhonchi. Breathing is unlabored. Heart: RRR with S1 S2. No  murmur . No rubs, or gallops appreciated. Abdomen: Soft, non-tender, non-distended with normoactive bowel sounds. No hepatomegaly. No rebound/guarding. No obvious abdominal masses. Msk:  Strength and tone appear normal for age. Extremities: No clubbing or cyanosis. No  edema.  Distal pedal pulses are 2+ and equal bilaterally. Skin: Warm and Dry Neuro: Alert and oriented X 3. CN III-XII intact Grossly normal sensory and motor function . Psych:  Responds to questions appropriately with a normal affect.      Labs: Cardiac Enzymes No results for input(s): CKTOTAL, CKMB, TROPONINI in the last 72 hours. CBC Lab Results  Component Value Date   WBC 7.4 06/30/2017   HGB 11.2 (L) 06/30/2017   HCT 33.6 (L) 06/30/2017   MCV 83.3 06/30/2017   PLT 152 06/30/2017   PROTIME: No results  for input(s): LABPROT, INR in the last 72 hours. Chemistry  Recent Labs  Lab 07/09/17 1521  NA 143  K 4.4  CL 110*  CO2 17*  BUN 20  CREATININE 1.97*  CALCIUM 8.7  GLUCOSE 45*   Lipids Lab Results  Component Value Date   CHOL 134 04/10/2017   HDL 29 (L) 04/10/2017   LDLCALC 88 04/10/2017   TRIG 85 04/10/2017   BNP No results found for: PROBNP Thyroid Function Tests: No results for input(s): TSH, T4TOTAL, T3FREE, THYROIDAB in the last 72 hours.  Invalid input(s): FREET3 Miscellaneous No results found for: DDIMER  Radiology/Studies:  No results found.  EKG: Sinus 52 16/09/46  Event Recorder personnally reviewed long RP tachycardia with documented gradual heart rate slowing with upright (  consistent with sinus) p waves  Assessment and Plan:  Orthostatic hypotension and syncope  CAD with prior PCI  SStrokes " watershed"  Palpitations  Hx Gastric Bypass Surgery  Anomalous Aortic circulation with carotid -L  artery bypass   Patient referred for consideration of an implantable loop recorder for recurrent strokes.  I am not sure if that is appropriate.  The imaging studies as reported by the neurologist describe CNS lesions as "watershed "in the context of known significant orthostatic hypotension.  This will need to be corroborated, but if this is the likely explanation, an event recorder would be unnecessary.  Her palpitations which occur frequently have been demonstrated to be long RP tachycardia with a P wave morphology consistent with sinus.  She has significant orthostatic intolerance, heat and shower intolerance in the context of diabetic neuropathy and prior gastric bypass surgery.  I think she is significantly dysautonomic.  We have seen dysautonomia in few patients following GI surgical/procedural intervention; which may be present in addition to the diabetic neuropathy   She takes a great deal of medication.  Mestinon has been used in patients with hypertension and orthostatic intolerance.  Nonpharmacological therapies include compressive wear i.e. thigh sleeves and/or spanks.  I would suggest that we pursue these given her pharmacopeia at least initially.  We have reviewed the physiology of orthostatic intolerance and the importance of isometric contraction  Have reviewed images w Dr Doy Mince who agrees with the issue of watershed  I will ask her to review this also with [facility]  Virl Axe

## 2017-07-15 NOTE — Patient Instructions (Signed)
Medication Instructions:  Your physician recommends that you continue on your current medications as directed. Please refer to the Current Medication list given to you today.   Labwork: none  Testing/Procedures: none  Follow-Up: Your physician recommends that you schedule a follow-up appointment in: 3-4 [place].   Any Other Special Instructions Will Be Listed Below (If Applicable).  Dr Caryl Comes recommends your to wear Spanks or compression wear.     If you need a refill on your cardiac medications before your next appointment, please call your pharmacy.

## 2017-07-26 ENCOUNTER — Other Ambulatory Visit: Payer: Self-pay | Admitting: Family Medicine

## 2017-07-26 DIAGNOSIS — G909 Disorder of the autonomic nervous system, unspecified: Secondary | ICD-10-CM | POA: Diagnosis not present

## 2017-07-26 DIAGNOSIS — F5102 Adjustment insomnia: Secondary | ICD-10-CM

## 2017-07-26 DIAGNOSIS — I6930 Unspecified sequelae of cerebral infarction: Secondary | ICD-10-CM | POA: Diagnosis not present

## 2017-07-26 DIAGNOSIS — F439 Reaction to severe stress, unspecified: Secondary | ICD-10-CM

## 2017-08-03 DIAGNOSIS — I10 Essential (primary) hypertension: Secondary | ICD-10-CM | POA: Diagnosis not present

## 2017-08-03 DIAGNOSIS — E782 Mixed hyperlipidemia: Secondary | ICD-10-CM | POA: Diagnosis not present

## 2017-08-03 DIAGNOSIS — Z9884 Bariatric surgery status: Secondary | ICD-10-CM | POA: Diagnosis not present

## 2017-08-03 DIAGNOSIS — E669 Obesity, unspecified: Secondary | ICD-10-CM | POA: Diagnosis not present

## 2017-08-03 DIAGNOSIS — E1142 Type 2 diabetes mellitus with diabetic polyneuropathy: Secondary | ICD-10-CM | POA: Diagnosis not present

## 2017-08-06 NOTE — Progress Notes (Signed)
Cardiology Office Note  Date:  08/10/2017   ID:  [address], [place] Mar 13, 1970, MRN [id number]  PCP:  [name], MD   Chief Complaint  Patient presents with  . Other    2 month follow up. Meds reviewed verbally with patient.     HPI:  47 year old woman with a long hx of  smoking,  strokes,  2010 chest pain dating back to 2005  CAD, Previous LAD stent  normal LV function,   s/p gastric bypass surgery,  Baseline creatinine 1.5, previously taken off her diuretic Catheterization May 2017 with stent placed to her first diagonal, there was residual 50% distal LAD disease, 30% proximal, 40% proximal circumflex Who presents for routine followup of her coronary artery disease  Was having worsening chest pain Concerning for angina cardiac catheterization 06/29/2017 70% mid LAD disease and first diagonal, as well as significant distal LAD disease close to apex Severe ostial stenosis second diagonal which was new In follow-up reports only periodic chest pain  Seeing endocrine for diabetes HBA1C 6.0  No difference in chest pain on the ranexa Would like to stop the medication Denies significant tachycardia or palpitations  No regular exercise program Lots of stress in her life, going through separation from her husband  Previous Gi issues, trouble swallowing Will eat only once a day Hx of gastric bypass  EKG personally reviewed by myself on todays visit  shows normal sinus rhythm with rate 53 bpm no significant ST or T-wave changes  other past medical history reviewed Cardiac catheterization   results reviewed with her from  March 2017showing moderate LAD disease, 70% diagonal disease small vessel, near the ostium,  Decision was made to treat this medically  Recent carotid ultrasound reviewed with her showing minimal bilateral carotid disease Recent echocardiogram reviewed with her showing essentially normal ejection fraction  She is status post left carotid to  subclavian artery bypass with subclavian artery ligation, done at [facility] by vascular surgery admission to [facility] from 10/5 to 11/13/14 with unstable angina and was found to have severe mid LAD lesion at 85-90% stenosis on diagnositc cardiac cath s/p PCI/DES on staged procedure.   CT in October 2015 showing Right aortic arch with a large retro- esophageal vascular segment, persistent dorsal aortic segment) giving off aberrant left subclavian artery. This large caliber retroaortic segment measuring 18 mm compared to the ascending aorta which measures 22 mm. This large retroesophageal aortic segment along with the partially calcified atretic ductus ligament between the left aberrant subclavian artery and the left pulmonary artery creates an ALMOSTcomplete vascular ring. This is likelythe cause ofpatient's dysphagia [place].  Previously was taking midodrine or Florinef for hypotension following her stroke.  She previously presented to the hospital December 18, 2010 with chest pain.   stress test in the hospital showed no ischemia. Cardiac enzymes were negative. No EKG changes noted.   admission to the hospital 11/01/2012 with discharge 11/02/2012 with chest pain. She ruled out with negative cardiac enzymes. Blood pressures were in the 884-166 systolic range. Discharge blood pressure medications included amlodipine 10 mg daily, benazepril 40 mg daily, clonidine 0.2 mg., Coreg 3.125 mg twice a day  stroke and admission to the hospital at the end of December 2014. She had acute onset left sided weakness including arm and leg. She reports that review of MRI showed acute stroke, as well as previous strokes. She was seen by neurology and aspirin changed to Plavix 75 mg daily. Hemoglobin A1c 8.5. Notes indicate  short run of SVT while she was in the hospital and on the monitor.  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  PSH:    Past Surgical History:  Procedure Laterality Date  . APPENDECTOMY    . CARDIAC CATHETERIZATION N/A 11/09/2014   Procedure: Coronary Angiography;  Surgeon: Minna Merritts, MD;  Location: [facility] [facility];  Service: Cardiovascular;  Laterality: N/A;  . CARDIAC CATHETERIZATION N/A 11/12/2014   Procedure: Coronary Stent Intervention;  Surgeon: Isaias Cowman, MD;  Location: [place] [facility];  Service: Cardiovascular;  Laterality: N/A;  . CARDIAC CATHETERIZATION N/A 04/18/2015   Procedure: Left Heart Cath and Coronary Angiography;  Surgeon: Minna Merritts, MD;  Location: [facility] [place] [facility];  Service: Cardiovascular;  Laterality: N/A;  . CARDIAC CATHETERIZATION Left 06/04/2015   Procedure: Left Heart Cath and Coronary Angiography;  Surgeon: Wellington Hampshire, MD;  Location: [place] [facility];  Service: Cardiovascular;  Laterality: Left;  . CARDIAC CATHETERIZATION N/A 06/04/2015   Procedure: Coronary Stent Intervention;  Surgeon: Wellington Hampshire, MD;  Location: [place] [facility];  Service: Cardiovascular;  Laterality: N/A;  . CESAREAN SECTION  2001  .  CHOLECYSTECTOMY N/A 11/18/2016   Procedure: LAPAROSCOPIC CHOLECYSTECTOMY WITH INTRAOPERATIVE CHOLANGIOGRAM;  Surgeon: Christene Lye, MD;  Location: [facility];  Service: General;  Laterality: N/A;  . COLONOSCOPY WITH PROPOFOL N/A 04/27/2016   Procedure: COLONOSCOPY WITH PROPOFOL;  Surgeon: [name], MD;  Location: [facility];  Service: Endoscopy;  Laterality: N/A;  . CORONARY ANGIOPLASTY    . CORONARY BALLOON ANGIOPLASTY N/A 06/29/2017   Procedure: CORONARY BALLOON ANGIOPLASTY;  Surgeon: Wellington Hampshire, MD;  Location: [place] [facility];  Service: Cardiovascular;  Laterality: N/A;  . DILATION AND CURETTAGE OF UTERUS    . ESOPHAGOGASTRODUODENOSCOPY (EGD) WITH PROPOFOL N/A 09/14/2014   Procedure: ESOPHAGOGASTRODUODENOSCOPY (EGD) WITH PROPOFOL;  Surgeon: Josefine Class, MD;  Location: [facility];  Service: Endoscopy;  Laterality: N/A;  . ESOPHAGOGASTRODUODENOSCOPY (EGD) WITH PROPOFOL N/A 04/27/2016   Procedure: ESOPHAGOGASTRODUODENOSCOPY (EGD) WITH PROPOFOL;  Surgeon: [name], MD;  Location: [facility];  Service: Endoscopy;  Laterality: N/A;  Diabetic - oral meds  . GASTRIC BYPASS  09/2009   [facility]   . Left Carotid to sublcavian artery bypass w/ subclavian artery ligation     a. Performed @ [facility].  . LEFT HEART CATH AND CORONARY ANGIOGRAPHY Left 06/29/2017   Procedure: LEFT HEART CATH AND CORONARY ANGIOGRAPHY;  Surgeon: Wellington Hampshire, MD;  Location: [facility];  Service: Cardiovascular;  Laterality: Left;  Marland Kitchen MELANOMA EXCISION  2016   Dr. Evorn Gong  . [place]  2002  . RIGHT OOPHORECTOMY    . SHOULDER ARTHROSCOPY WITH OPEN ROTATOR CUFF REPAIR Right 01/07/2016  Procedure: SHOULDER ARTHROSCOPY WITH DEBRIDMENT, SUBACHROMIAL DECOMPRESSION;  Surgeon: Corky Mull, MD;  Location: [facility];  Service: Orthopedics;  Laterality: Right;  . SHOULDER ARTHROSCOPY WITH OPEN ROTATOR CUFF REPAIR Right 03/16/2017   Procedure:  SHOULDER ARTHROSCOPY WITH OPEN ROTATOR CUFF REPAIR POSSIBLE BICEPS TENODESIS;  Surgeon: Corky Mull, MD;  Location: [facility];  Service: Orthopedics;  Laterality: Right;  . TRIGGER FINGER RELEASE Right     Middle Finger    Current Outpatient Medications  Medication Sig Dispense Refill  . ALPRAZolam (XANAX) 0.5 MG tablet Take 0.5-1 tablets (0.25-0.5 mg total) by mouth 2 (two) times daily as needed for anxiety. 30 tablet 5  . aspirin 81 MG chewable tablet Chew 1 tablet (81 mg total) by mouth daily. 30 tablet 11  . Blood Glucose Monitoring Suppl (ONE TOUCH ULTRA 2) w/Device KIT Use to check blood sugar once a day. Dx. E11.9 1 each 0  . Calcium Carbonate-Vitamin D (CALCIUM 500/D PO) Take 500 mg by mouth.    . celecoxib (CELEBREX) 200 MG capsule Take 200 mg by mouth 2 (two) times daily as needed for moderate pain.     . citalopram (CELEXA) 40 MG tablet Take 1 tablet (40 mg total) by mouth daily. 30 tablet 5  . clopidogrel (PLAVIX) 75 MG tablet Take 1 tablet (75 mg total) by mouth daily with breakfast. 30 tablet 2  . Cyanocobalamin (VITAMIN B 12 PO) Take 1,000 mcg by mouth.    . diltiazem (CARDIZEM CD) 120 MG 24 hr capsule Take 1 capsule (120 mg total) by mouth daily.    Marland Kitchen gabapentin (NEURONTIN) 300 MG capsule Take 1 capsule (300 mg total) by mouth 2 (two) times daily. 180 capsule 3  . hydrALAZINE (APRESOLINE) 50 MG tablet Take 1 tablet (50 mg total) by mouth 3 (three) times daily. 270 tablet 3  . hyoscyamine (LEVSIN SL) 0.125 MG SL tablet Place 1 tablet (0.125 mg total) under the tongue every 6 (six) hours as needed. (Patient taking differently: Place 0.125 mg under the tongue every 6 (six) hours as needed for cramping. ) 60 tablet 0  . levothyroxine (SYNTHROID, LEVOTHROID) 25 MCG tablet Take 1 tablet (25 mcg total) daily before breakfast by mouth. 30 tablet 12  . losartan (COZAAR) 100 MG tablet Take 1 tablet (100 mg) by mouth once daily    . magnesium oxide (MAG-OX) 400 MG tablet Take 1 tablet (400  mg total) by mouth daily. 90 tablet 3  . metoprolol succinate (TOPROL-XL) 50 MG 24 hr tablet Take 1 tablet (50 mg total) by mouth daily. Take with or immediately following a meal. 30 tablet 3  . Multiple Vitamin (MULTIVITAMIN WITH MINERALS) TABS tablet Take 1 tablet by mouth daily.    . nitroGLYCERIN (NITROSTAT) 0.4 MG SL tablet Place 1 tablet (0.4 mg total) under the tongue every 5 (five) minutes as needed for chest pain. 25 tablet 2  . pantoprazole (PROTONIX) 40 MG tablet Take 1 tablet (40 mg total) by mouth 2 (two) times daily. 60 tablet 5  . pioglitazone (ACTOS) 15 MG tablet Take by mouth.    . promethazine (PHENERGAN) 25 MG tablet Take 25 mg by mouth every 6 (six) hours as needed for nausea or vomiting.     . pyridostigmine (MESTINON) 60 MG tablet Take by mouth.    . ranolazine (RANEXA) 500 MG 12 hr tablet Take 1 tablet (500 mg total) by mouth 2 (two) times daily. 60 tablet 11  . rosuvastatin (CRESTOR) 10 MG tablet Take 1  tablet (10 mg total) by mouth daily. 30 tablet 11   No current facility-administered medications for this visit.      Allergies:   Lipitor [atorvastatin] and Tramadol   Social History:  The patient  reports that she quit smoking about 22 years ago. Her smoking use included cigarettes. She has never used smokeless tobacco. She reports that she does not drink alcohol or use drugs.   Family History:   family history includes Anxiety disorder in her father and mother; Bipolar disorder in her mother; Breast cancer in her other; Breast cancer (age of onset: 70) in her maternal aunt and maternal aunt; Colon cancer in her cousin and father; Depression in her father, mother, and sister; Diabetes in her father and sister; Heart disease in her father and mother; Hyperlipidemia in her mother and sister; Hypertension in her father, mother, sister, and sister; Kidney disease in her father and sister; Ovarian cancer in her cousin; Skin cancer in her father; Stroke in her father; Thyroid  nodules in her sister.    Review of Systems: Review of Systems  Constitutional: Negative.   Respiratory: Negative.   Cardiovascular: Positive for chest pain.  Gastrointestinal: Negative.   Musculoskeletal: Negative.   Neurological: Negative.   Psychiatric/Behavioral: Negative.   All other systems reviewed and are negative.   PHYSICAL EXAM: VS:  BP 126/72 (BP Location: Left Arm, Patient Position: Sitting, Cuff Size: Normal)   Pulse (!) 53   Ht _0  (1.575 m)   Wt 170 lb 4 oz (77.2 kg)   BMI 31.14 kg/m  , BMI Body mass index is 31.14 kg/m. Constitutional:  oriented to person, place, and time. No distress.  HENT:  Head: Normocephalic and atraumatic.  Eyes:  no discharge. No scleral icterus.  Neck: Normal range of motion. Neck supple. No JVD present.  Cardiovascular: Normal rate, regular rhythm, normal heart sounds and intact distal pulses. Exam reveals no gallop and no friction rub. No edema No murmur heard. Pulmonary/Chest: Effort normal and breath sounds normal. No stridor. No respiratory distress.  no wheezes.  no rales.  no tenderness.  Abdominal: Soft.  no distension.  no tenderness.  Musculoskeletal: Normal range of motion.  no  tenderness or deformity.  Neurological:  normal muscle tone. Coordination normal. No atrophy Skin: Skin is warm and dry. No rash noted. not diaphoretic.  Psychiatric:  normal mood and affect. behavior is normal. Thought content normal.   Recent Labs: 03/19/2017: TSH 1.013 04/09/2017: ALT 35 06/18/2017: BNP 317.4 06/30/2017: Hemoglobin 11.2; Platelets 152 07/09/2017: BUN 20; Creatinine, Ser 1.97; Magnesium 1.6; Potassium 4.4; Sodium 143    Lipid Panel Lab Results  Component Value Date   CHOL 134 04/10/2017   HDL 29 (L) 04/10/2017   LDLCALC 88 04/10/2017   TRIG 85 04/10/2017      Wt Readings from Last 3 Encounters:  08/10/17 170 lb 4 oz (77.2 kg)  07/15/17 175 lb 4 oz (79.5 kg)  07/09/17 171 lb (77.6 kg)       ASSESSMENT AND  PLAN:   Coronary artery disease involving native coronary artery of native heart with unstable angina pectoris ([place]) - Plan: EKG 12-Lead  Prior stent May 2017 Currently with no symptoms of angina. No further workup at this time. Continue current medication regimen.stable Periodic use of nitroglycerin She does not want Ranexa  Pure hypercholesterolemia Recommended she stay on her Crestor 40 mg daily and Zetia Goal LDL less than 70  Uncontrolled type 2 diabetes mellitus with other circulatory complication,  without long-term current use of insulin (HCC) Hemoglobin A1c 6.0 Working with endocrinology  Essential hypertension Blood pressure is well controlled on today's visit. No changes made to the medications.  Cerebrovascular accident (CVA), unspecified mechanism ([place]) Previously seen by neurology at [facility] They felt her stroke was secondary to hypotension per the patient  Chronic kidney disease (CKD), stage III (moderate) Followed by nephrology, creatinine down to 1.5  avoid Lasix and NSAIDs Stay hydrated  Sinus tachycardia Better on beta blockers No further workup needed  Disposition:   F/U  12 months   Total encounter time more than 25 minutes  Greater than 50% was spent in counseling and coordination of care with the patient    Orders Placed This Encounter  Procedures  . EKG 12-Lead     Signed, Esmond Plants, M.D., Ph.D. 08/10/2017  [place], [place]

## 2017-08-10 ENCOUNTER — Ambulatory Visit (INDEPENDENT_AMBULATORY_CARE_PROVIDER_SITE_OTHER): Payer: Medicare Other | Admitting: Cardiovascular Disease

## 2017-08-10 ENCOUNTER — Encounter: Payer: Self-pay | Admitting: Cardiovascular Disease

## 2017-08-10 ENCOUNTER — Other Ambulatory Visit: Payer: Self-pay | Admitting: Family Medicine

## 2017-08-10 VITALS — BP 126/72 | HR 53 | Ht 62.0 in | Wt 170.2 lb

## 2017-08-10 DIAGNOSIS — N183 Chronic kidney disease, stage 3 unspecified: Secondary | ICD-10-CM

## 2017-08-10 DIAGNOSIS — I6523 Occlusion and stenosis of bilateral carotid arteries: Secondary | ICD-10-CM

## 2017-08-10 DIAGNOSIS — R000 Tachycardia, unspecified: Secondary | ICD-10-CM

## 2017-08-10 DIAGNOSIS — I10 Essential (primary) hypertension: Secondary | ICD-10-CM

## 2017-08-10 DIAGNOSIS — E7800 Pure hypercholesterolemia, unspecified: Secondary | ICD-10-CM

## 2017-08-10 DIAGNOSIS — I200 Unstable angina: Secondary | ICD-10-CM

## 2017-08-10 DIAGNOSIS — I6329 Cerebral infarction due to unspecified occlusion or stenosis of other precerebral arteries: Secondary | ICD-10-CM

## 2017-08-10 DIAGNOSIS — I255 Ischemic cardiomyopathy: Secondary | ICD-10-CM

## 2017-08-10 DIAGNOSIS — E118 Type 2 diabetes mellitus with unspecified complications: Secondary | ICD-10-CM | POA: Diagnosis not present

## 2017-08-10 DIAGNOSIS — I119 Hypertensive heart disease without heart failure: Secondary | ICD-10-CM | POA: Diagnosis not present

## 2017-08-10 DIAGNOSIS — I5022 Chronic systolic (congestive) heart failure: Secondary | ICD-10-CM

## 2017-08-10 DIAGNOSIS — I25118 Atherosclerotic heart disease of native coronary artery with other forms of angina pectoris: Secondary | ICD-10-CM

## 2017-08-10 MED ORDER — ROSUVASTATIN CALCIUM 10 MG PO TABS: 10.0000 mg | ORAL_TABLET | Freq: Every day | ORAL | 11 refills | Status: DC

## 2017-08-10 NOTE — Patient Instructions (Addendum)
Medication Instructions:   Please increase crestor up to 10 mg daily  Labwork:  No new labs needed  Testing/Procedures:  No further testing at this time   Follow-Up: It was a pleasure seeing you in the office today. Please call us if you have new issues that need to be addressed before your next appt.  [phone]  Your physician wants you to follow-up in: 12 months.  You will receive a reminder letter in the mail two months in advance. If you don't receive a letter, please call our office to schedule the follow-up appointment.  If you need a refill on your cardiac medications before your next appointment, please call your pharmacy.  For educational health videos Log in to : [email or web address] Or : [email or web address], password : [email or web address]

## 2017-08-18 ENCOUNTER — Ambulatory Visit (INDEPENDENT_AMBULATORY_CARE_PROVIDER_SITE_OTHER): Payer: Medicare Other | Admitting: Family Medicine

## 2017-08-18 ENCOUNTER — Encounter: Payer: Self-pay | Admitting: Family Medicine

## 2017-08-18 VITALS — BP 110/74 | HR 60 | Temp 98.4°F | Resp 16 | Wt 174.0 lb

## 2017-08-18 DIAGNOSIS — I200 Unstable angina: Secondary | ICD-10-CM | POA: Diagnosis not present

## 2017-08-18 DIAGNOSIS — F439 Reaction to severe stress, unspecified: Secondary | ICD-10-CM | POA: Diagnosis not present

## 2017-08-18 DIAGNOSIS — E118 Type 2 diabetes mellitus with unspecified complications: Secondary | ICD-10-CM | POA: Diagnosis not present

## 2017-08-18 DIAGNOSIS — I25118 Atherosclerotic heart disease of native coronary artery with other forms of angina pectoris: Secondary | ICD-10-CM | POA: Diagnosis not present

## 2017-08-18 DIAGNOSIS — F5102 Adjustment insomnia: Secondary | ICD-10-CM | POA: Diagnosis not present

## 2017-08-18 DIAGNOSIS — J40 Bronchitis, not specified as acute or chronic: Secondary | ICD-10-CM

## 2017-08-18 MED ORDER — ALPRAZOLAM 1 MG PO TABS: 1.0000 mg | ORAL_TABLET | Freq: Every day | ORAL | 3 refills | Status: DC | PRN

## 2017-08-18 MED ORDER — AZITHROMYCIN 250 MG PO TABS: ORAL_TABLET | ORAL | 0 refills | Status: AC

## 2017-08-18 NOTE — Progress Notes (Signed)
Patient: [name] Female    DOB: July 05, 1970   47 y.o.   MRN: [id number] Visit Date: 08/18/2017  Today's Provider: [name], MD   Chief Complaint  Patient presents with  . Cough   Subjective:    Cough  This is a new problem. The current episode started 1 to 4 weeks ago (2 weeks). The cough is non-productive. Associated symptoms include chest pain, ear congestion, headaches, rhinorrhea, shortness of breath, sweats and wheezing. Pertinent negatives include no chills, ear pain, fever, heartburn, hemoptysis, myalgias, nasal congestion, postnasal drip, rash, sore throat or weight loss. The symptoms are aggravated by lying down. She has tried nothing for the symptoms. Her past medical history is significant for pneumonia.    Patient states she has had cough and chest congestion for around 2 weeks. Patient states cough is not productive. Patient states her chest feels heavy. Other symptoms include shortness of breath, wheezing, runny nose and right ear congestion. Patient has not been taking any medication for cough.   She also reports that she was recently started on pioglitazone and taken off insulin by endocrinology and is doing well with current regiment. She had visit with Dr. [name] recently and had rosuvastatin increased from three a week to once every day and is tolerating well so far.    She requests refill for alprazolam which she states she only takes in the evenings. However she states one doesn't have any effect so she usually takes two of the 0.66m tablets she is still taking citalopram every day.   Allergies  Allergen Reactions  . Lipitor [Atorvastatin] Other (See Comments)    Leg pains  . Tramadol Other (See Comments)    Mouth feels like it's on fire     Current Outpatient Medications:  .  ALPRAZolam (XANAX) 0.5 MG tablet, Take 0.5-1 tablets (0.25-0.5 mg total) by mouth 2 (two) times daily as needed for anxiety., Disp: 30 tablet, Rfl: 5 .  aspirin 81 MG  chewable tablet, Chew 1 tablet (81 mg total) by mouth daily., Disp: 30 tablet, Rfl: 11 .  Blood Glucose Monitoring Suppl (ONE TOUCH ULTRA 2) w/Device KIT, Use to check blood sugar once a day. Dx. E11.9, Disp: 1 each, Rfl: 0 .  buPROPion (WELLBUTRIN XL) 150 MG 24 hr tablet, TAKE 1 TABLET BY MOUTH DAILY, Disp: 30 tablet, Rfl: 11 .  Calcium Carbonate-Vitamin D (CALCIUM 500/D PO), Take 500 mg by mouth., Disp: , Rfl:  .  celecoxib (CELEBREX) 200 MG capsule, Take 200 mg by mouth 2 (two) times daily as needed for moderate pain. , Disp: , Rfl:  .  citalopram (CELEXA) 40 MG tablet, Take 1 tablet (40 mg total) by mouth daily., Disp: 30 tablet, Rfl: 5 .  clopidogrel (PLAVIX) 75 MG tablet, Take 1 tablet (75 mg total) by mouth daily with breakfast., Disp: 30 tablet, Rfl: 2 .  Cyanocobalamin (VITAMIN B 12 PO), Take 1,000 mcg by mouth., Disp: , Rfl:  .  diltiazem (CARDIZEM CD) 120 MG 24 hr capsule, Take 1 capsule (120 mg total) by mouth daily., Disp: , Rfl:  .  gabapentin (NEURONTIN) 300 MG capsule, Take 1 capsule (300 mg total) by mouth 2 (two) times daily., Disp: 180 capsule, Rfl: 3 .  hydrALAZINE (APRESOLINE) 50 MG tablet, Take 1 tablet (50 mg total) by mouth 3 (three) times daily., Disp: 270 tablet, Rfl: 3 .  hyoscyamine (LEVSIN SL) 0.125 MG SL tablet, Place 1 tablet (0.125 mg total) under  the tongue every 6 (six) hours as needed. (Patient taking differently: Place 0.125 mg under the tongue every 6 (six) hours as needed for cramping. ), Disp: 60 tablet, Rfl: 0 .  levothyroxine (SYNTHROID, LEVOTHROID) 25 MCG tablet, Take 1 tablet (25 mcg total) daily before breakfast by mouth., Disp: 30 tablet, Rfl: 12 .  losartan (COZAAR) 100 MG tablet, Take 1 tablet (100 mg) by mouth once daily, Disp: , Rfl:  .  magnesium oxide (MAG-OX) 400 MG tablet, Take 1 tablet (400 mg total) by mouth daily., Disp: 90 tablet, Rfl: 3 .  metoprolol succinate (TOPROL-XL) 50 MG 24 hr tablet, Take 1 tablet (50 mg total) by mouth daily. Take with  or immediately following a meal., Disp: 30 tablet, Rfl: 3 .  Multiple Vitamin (MULTIVITAMIN WITH MINERALS) TABS tablet, Take 1 tablet by mouth daily., Disp: , Rfl:  .  nitroGLYCERIN (NITROSTAT) 0.4 MG SL tablet, Place 1 tablet (0.4 mg total) under the tongue every 5 (five) minutes as needed for chest pain., Disp: 25 tablet, Rfl: 2 .  pantoprazole (PROTONIX) 40 MG tablet, Take 1 tablet (40 mg total) by mouth 2 (two) times daily., Disp: 60 tablet, Rfl: 5 .  pioglitazone (ACTOS) 15 MG tablet, Take by mouth., Disp: , Rfl:  .  promethazine (PHENERGAN) 25 MG tablet, Take 25 mg by mouth every 6 (six) hours as needed for nausea or vomiting. , Disp: , Rfl:  .  pyridostigmine (MESTINON) 60 MG tablet, Take by mouth., Disp: , Rfl:  .  rosuvastatin (CRESTOR) 10 MG tablet, Take 1 tablet (10 mg total) by mouth daily., Disp: 30 tablet, Rfl: 11  Review of Systems  Constitutional: Negative for appetite change, chills, fatigue, fever and weight loss.  HENT: Positive for congestion and rhinorrhea. Negative for ear pain, postnasal drip, sinus pressure, sinus pain and sore throat.   Respiratory: Positive for cough, shortness of breath and wheezing. Negative for hemoptysis and chest tightness.   Cardiovascular: Positive for chest pain. Negative for palpitations.  Gastrointestinal: Negative for abdominal pain, heartburn, nausea and vomiting.  Musculoskeletal: Negative for myalgias.  Skin: Negative for rash.  Neurological: Positive for headaches. Negative for dizziness and weakness.    Social History   Tobacco Use  . Smoking status: Former Smoker    Types: Cigarettes    Last attempt to quit: 08/31/1994    Years since quitting: 22.9  . Smokeless tobacco: Never Used  . Tobacco comment: quit 28 years ago  Substance Use Topics  . Alcohol use: No    Alcohol/week: 0.0 oz   Objective:   BP 110/74 (BP Location: Right Arm, Patient Position: Sitting, Cuff Size: Normal)   Pulse 60   Temp 98.4 F (36.9 C) (Oral)    Resp 16   Wt 174 lb (78.9 kg)   SpO2 98%   BMI 31.83 kg/m  Vitals:   08/18/17 1330  BP: 110/74  Pulse: 60  Resp: 16  Temp: 98.4 F (36.9 C)  TempSrc: Oral  SpO2: 98%  Weight: 174 lb (78.9 kg)     Physical Exam  General Appearance:    Alert, cooperative, no distress  HENT:   neck without nodes and sinuses nontender  Eyes:    PERRL, conjunctiva/corneas clear, EOM's intact       Lungs:     Occasional wheezes, no rales. , respirations unlabored  Heart:    Regular rate and rhythm  Neurologic:   Awake, alert, oriented x 3. No apparent focal neurological  defect.           Assessment & Plan:     1. Bronchitis  - azithromycin (ZITHROMAX) 250 MG tablet; 2 by mouth today, then 1 daily for 4 days  Dispense: 6 tablet; Refill: 0 Call if symptoms change or if not rapidly improving.   2. Situational stress Doing well take 2 x 0.15m alprazolam in the evening and SSRI daily.  - ALPRAZolam (XANAX) 1 MG tablet; Take 1 tablet (1 mg total) by mouth daily as needed for anxiety.  Dispense: 30 tablet; Refill: 3  3. Adjustment insomnia  - ALPRAZolam (XANAX) 1 MG tablet; Take 1 tablet (1 mg total) by mouth daily as needed for anxiety.  Dispense: 30 tablet; Refill: 3  4. Coronary artery disease of native artery of native heart with stable angina pectoris ([place] Recently increased rosuvastatin to QD. Return to check lipids in 2 months. Consider Repatha if not able to get LDL to goal with statin.   5. Type 2 diabetes mellitus with complication, without long-term current use of insulin ([place] Doing well current medications. Follow up Dr. OHal Moralesoffice as scheduled.        DLelon Huh MD  [facility]

## 2017-08-25 ENCOUNTER — Telehealth: Payer: Self-pay | Admitting: Family Medicine

## 2017-08-25 NOTE — Telephone Encounter (Signed)
Pt states she has finished antibiotic ans she still is not feeling well.  States she is having pain in her right ear still.  States she is coughing up some mucus but still feels like she has not improved.    She uses [place]

## 2017-08-26 MED ORDER — AMOXICILLIN-POT CLAVULANATE 875-125 MG PO TABS: 1.0000 | ORAL_TABLET | Freq: Two times a day (BID) | ORAL | 0 refills | Status: AC

## 2017-08-26 NOTE — Telephone Encounter (Signed)
Patient advised.

## 2017-08-26 NOTE — Telephone Encounter (Signed)
Change to Augmentin, have sent prescription to [facility]

## 2017-08-31 IMAGING — CR DG KNEE COMPLETE 4+V*R*
1 series · 4 of 4 positions shown · non-contrast
Comparison: 06/03/2014 .

CLINICAL DATA: Fall.  Initial evaluation

EXAM:
RIGHT KNEE - COMPLETE 4+ VIEW

[Series 1: dg knee complete 4 views right · 0.14mm/px · 4 of 4 slices shown]
[im 1/4]
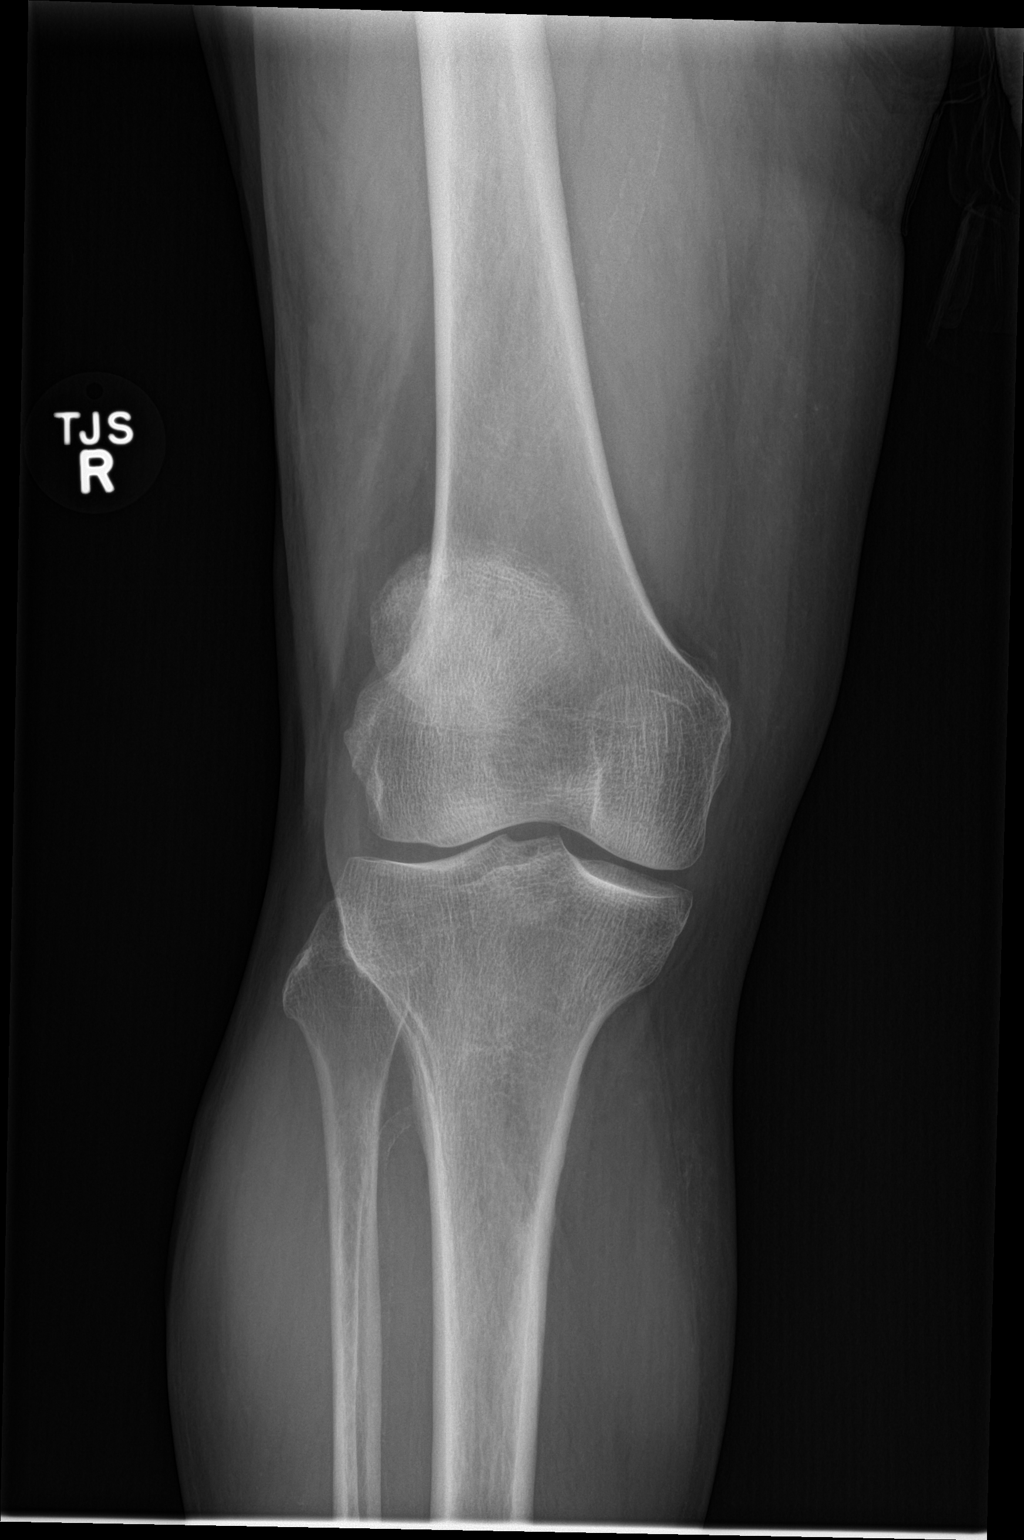
[im 2/4]
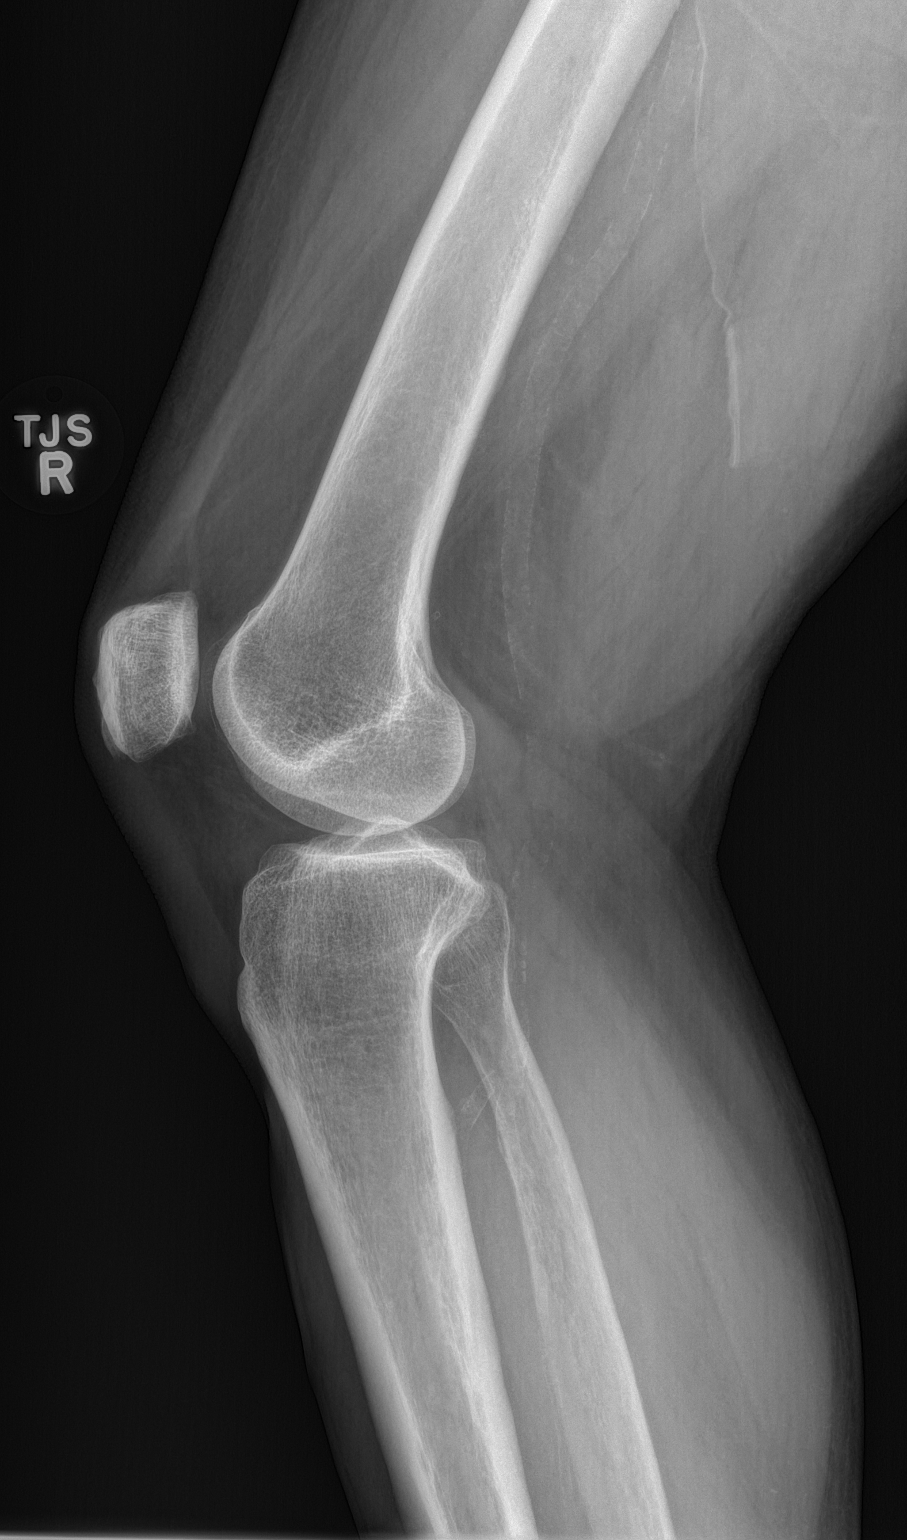
[im 3/4]
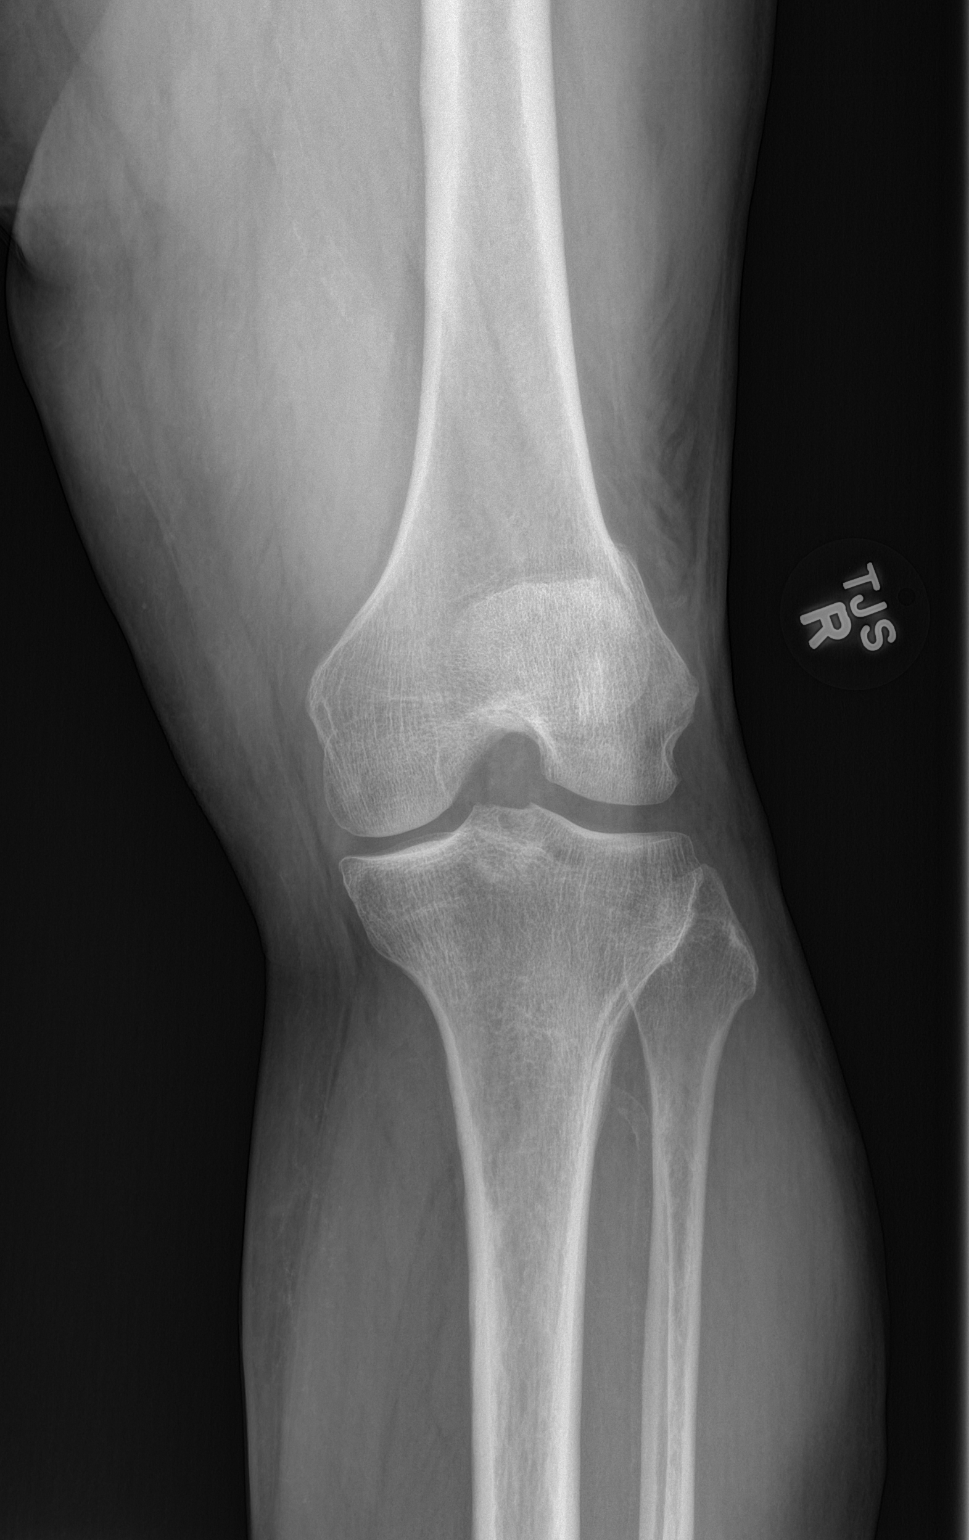
[im 4/4]
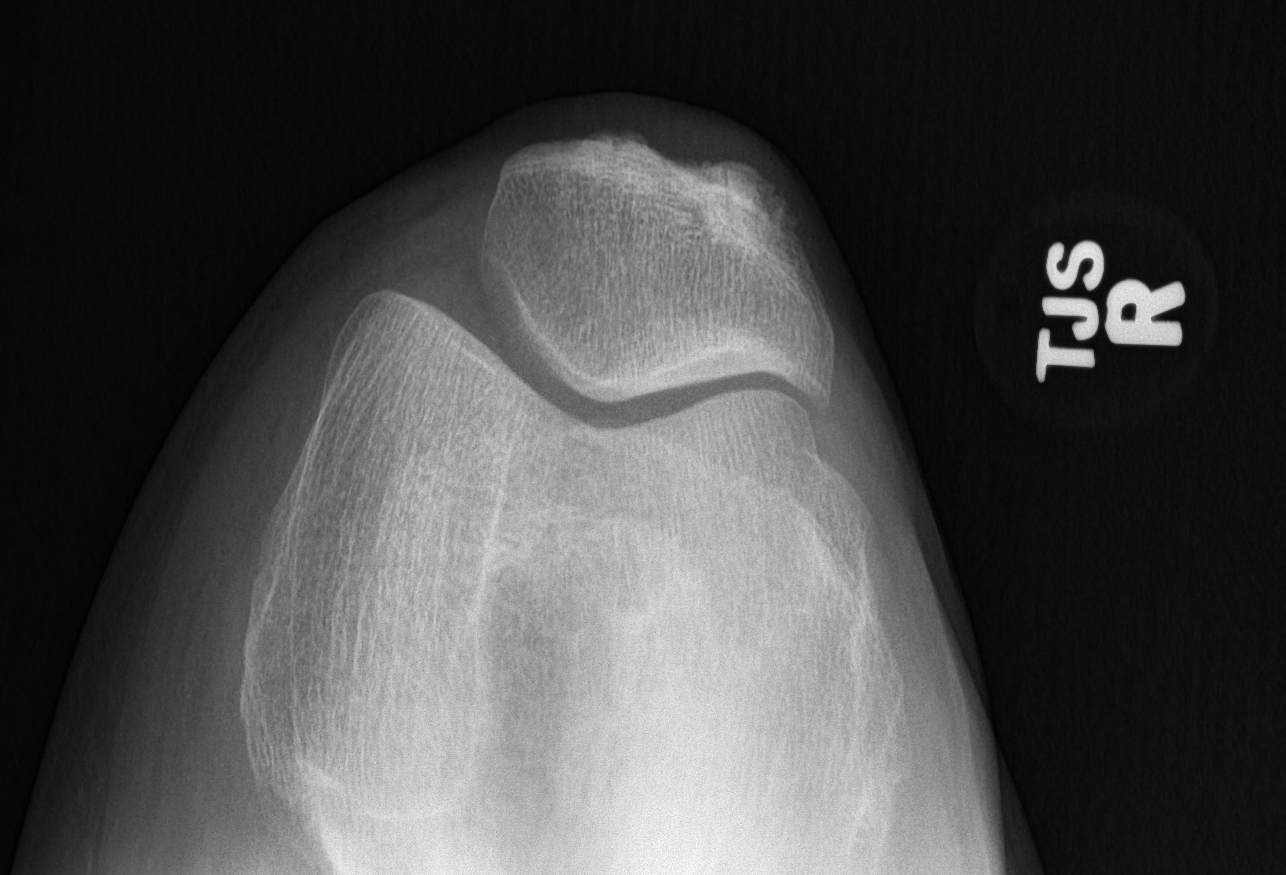

[4 of 4 positions shown; findings below may reference images not displayed]

FINDINGS: Mild degenerative change. No acute bony or joint abnormality. Mild
medial compartment degenerative change . Peripheral vascular
calcification.
IMPRESSION: 1.  No acute or focal bony abnormality.

2. Peripheral vascular disease.

## 2017-09-07 ENCOUNTER — Encounter: Payer: Self-pay | Admitting: Family Medicine

## 2017-09-07 ENCOUNTER — Ambulatory Visit (INDEPENDENT_AMBULATORY_CARE_PROVIDER_SITE_OTHER): Payer: Medicare Other | Admitting: Family Medicine

## 2017-09-07 VITALS — BP 140/72 | HR 86 | Temp 98.7°F | Resp 16 | Wt 178.4 lb

## 2017-09-07 DIAGNOSIS — I200 Unstable angina: Secondary | ICD-10-CM

## 2017-09-07 DIAGNOSIS — S99922A Unspecified injury of left foot, initial encounter: Secondary | ICD-10-CM

## 2017-09-07 DIAGNOSIS — S161XXA Strain of muscle, fascia and tendon at neck level, initial encounter: Secondary | ICD-10-CM

## 2017-09-07 MED ORDER — PREDNISONE 10 MG PO TABS: ORAL_TABLET | ORAL | 0 refills | Status: DC

## 2017-09-07 NOTE — Progress Notes (Signed)
  Subjective:     Patient ID: [name], female   DOB: January 24, 1971, 47 y.o.   MRN: [id number] Chief Complaint  Patient presents with  . Neck Pain    Patient comes in office today with concerns of neck pain primarily on the left side for the past month. Patient reports difficulty turning head and looking up and down, paitent describes pain as stiffness.  . Toe Pain    Patient reports that a week ago she was moving and stubbed her left great toe on object. Today patient reports brusing and pain.    HPI States she sleeps on several pillows at night due to CHF. Has not taken any medication for this. 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  Review of Systems     Objective:   Physical Exam  Constitutional: She appears well-developed and well-nourished. No distress.  Musculoskeletal:  Tender over left posterior cervical area esp.posterior to her ear. Cervical ROM limited in all planes. Grip strength 5/5 symmetrically. Left great toe with hemorrhagic vesicle at the tip without deformity. She can move her toe without difficulty.       Assessment:    1. Injury of left great toe, initial encounter  2. Strain of cervical portion of left trapezius muscle - predniSONE (DELTASONE) 10 MG tablet; Taper daily as follows: 6 pills, 5, 4, 3, 2, 1  Dispense: 21 tablet; Refill: 0    Plan:    Discussed use of Tylenol and warm compresses to her neck. Warm salt water soaks to her toe twice daily.

## 2017-09-07 NOTE — Patient Instructions (Addendum)
Discussed use of warm epsom salt soaks for 10 minutes twice daily. May use warm compresses to your neck for 20 minutes several x day. Use Tylenol up to 3000 mg/ day for pain.

## 2017-09-16 DIAGNOSIS — N2581 Secondary hyperparathyroidism of renal origin: Secondary | ICD-10-CM | POA: Diagnosis not present

## 2017-09-16 DIAGNOSIS — E1122 Type 2 diabetes mellitus with diabetic chronic kidney disease: Secondary | ICD-10-CM | POA: Diagnosis not present

## 2017-09-16 DIAGNOSIS — N183 Chronic kidney disease, stage 3 (moderate): Secondary | ICD-10-CM | POA: Diagnosis not present

## 2017-09-16 DIAGNOSIS — I10 Essential (primary) hypertension: Secondary | ICD-10-CM | POA: Diagnosis not present

## 2017-09-19 ENCOUNTER — Other Ambulatory Visit: Payer: Self-pay

## 2017-09-19 ENCOUNTER — Emergency Department: Payer: Medicare Other

## 2017-09-19 ENCOUNTER — Inpatient Hospital Stay
Admission: EM | Admit: 2017-09-19 | Discharge: 2017-09-21 | DRG: 251 | Disposition: A | Discharge status: Home / Self Care | Payer: Medicare Other | Attending: Internal Medicine | Admitting: Internal Medicine

## 2017-09-19 ENCOUNTER — Telehealth: Payer: Self-pay | Admitting: Cardiology

## 2017-09-19 DIAGNOSIS — E1122 Type 2 diabetes mellitus with diabetic chronic kidney disease: Secondary | ICD-10-CM | POA: Diagnosis present

## 2017-09-19 DIAGNOSIS — E669 Obesity, unspecified: Secondary | ICD-10-CM | POA: Diagnosis present

## 2017-09-19 DIAGNOSIS — Z803 Family history of malignant neoplasm of breast: Secondary | ICD-10-CM

## 2017-09-19 DIAGNOSIS — Z8041 Family history of malignant neoplasm of ovary: Secondary | ICD-10-CM

## 2017-09-19 DIAGNOSIS — Z79890 Hormone replacement therapy (postmenopausal): Secondary | ICD-10-CM | POA: Diagnosis not present

## 2017-09-19 DIAGNOSIS — N183 Chronic kidney disease, stage 3 (moderate): Secondary | ICD-10-CM | POA: Diagnosis present

## 2017-09-19 DIAGNOSIS — Z79899 Other long term (current) drug therapy: Secondary | ICD-10-CM | POA: Diagnosis not present

## 2017-09-19 DIAGNOSIS — Z7952 Long term (current) use of systemic steroids: Secondary | ICD-10-CM

## 2017-09-19 DIAGNOSIS — Z7902 Long term (current) use of antithrombotics/antiplatelets: Secondary | ICD-10-CM | POA: Diagnosis not present

## 2017-09-19 DIAGNOSIS — E876 Hypokalemia: Secondary | ICD-10-CM | POA: Diagnosis present

## 2017-09-19 DIAGNOSIS — F319 Bipolar disorder, unspecified: Secondary | ICD-10-CM | POA: Diagnosis present

## 2017-09-19 DIAGNOSIS — Z8711 Personal history of peptic ulcer disease: Secondary | ICD-10-CM

## 2017-09-19 DIAGNOSIS — F419 Anxiety disorder, unspecified: Secondary | ICD-10-CM | POA: Diagnosis present

## 2017-09-19 DIAGNOSIS — I200 Unstable angina: Secondary | ICD-10-CM | POA: Diagnosis present

## 2017-09-19 DIAGNOSIS — Z8249 Family history of ischemic heart disease and other diseases of the circulatory system: Secondary | ICD-10-CM

## 2017-09-19 DIAGNOSIS — I5022 Chronic systolic (congestive) heart failure: Secondary | ICD-10-CM | POA: Diagnosis present

## 2017-09-19 DIAGNOSIS — M1990 Unspecified osteoarthritis, unspecified site: Secondary | ICD-10-CM | POA: Diagnosis present

## 2017-09-19 DIAGNOSIS — Z800 Family history of malignant neoplasm of digestive organs: Secondary | ICD-10-CM

## 2017-09-19 DIAGNOSIS — Z7982 Long term (current) use of aspirin: Secondary | ICD-10-CM

## 2017-09-19 DIAGNOSIS — Z9861 Coronary angioplasty status: Secondary | ICD-10-CM | POA: Diagnosis not present

## 2017-09-19 DIAGNOSIS — Z808 Family history of malignant neoplasm of other organs or systems: Secondary | ICD-10-CM

## 2017-09-19 DIAGNOSIS — I130 Hypertensive heart and chronic kidney disease with heart failure and stage 1 through stage 4 chronic kidney disease, or unspecified chronic kidney disease: Secondary | ICD-10-CM | POA: Diagnosis present

## 2017-09-19 DIAGNOSIS — Z9884 Bariatric surgery status: Secondary | ICD-10-CM

## 2017-09-19 DIAGNOSIS — R001 Bradycardia, unspecified: Secondary | ICD-10-CM | POA: Diagnosis not present

## 2017-09-19 DIAGNOSIS — Z955 Presence of coronary angioplasty implant and graft: Secondary | ICD-10-CM

## 2017-09-19 DIAGNOSIS — Z87891 Personal history of nicotine dependence: Secondary | ICD-10-CM

## 2017-09-19 DIAGNOSIS — Z9049 Acquired absence of other specified parts of digestive tract: Secondary | ICD-10-CM

## 2017-09-19 DIAGNOSIS — E785 Hyperlipidemia, unspecified: Secondary | ICD-10-CM | POA: Diagnosis present

## 2017-09-19 DIAGNOSIS — I252 Old myocardial infarction: Secondary | ICD-10-CM | POA: Diagnosis not present

## 2017-09-19 DIAGNOSIS — E039 Hypothyroidism, unspecified: Secondary | ICD-10-CM | POA: Diagnosis present

## 2017-09-19 DIAGNOSIS — I255 Ischemic cardiomyopathy: Secondary | ICD-10-CM | POA: Diagnosis present

## 2017-09-19 DIAGNOSIS — E119 Type 2 diabetes mellitus without complications: Secondary | ICD-10-CM | POA: Diagnosis not present

## 2017-09-19 DIAGNOSIS — Z90721 Acquired absence of ovaries, unilateral: Secondary | ICD-10-CM

## 2017-09-19 DIAGNOSIS — R197 Diarrhea, unspecified: Secondary | ICD-10-CM | POA: Diagnosis present

## 2017-09-19 DIAGNOSIS — I10 Essential (primary) hypertension: Secondary | ICD-10-CM | POA: Diagnosis not present

## 2017-09-19 DIAGNOSIS — Z888 Allergy status to other drugs, medicaments and biological substances status: Secondary | ICD-10-CM

## 2017-09-19 DIAGNOSIS — Z85820 Personal history of malignant melanoma of skin: Secondary | ICD-10-CM

## 2017-09-19 DIAGNOSIS — I214 Non-ST elevation (NSTEMI) myocardial infarction: Secondary | ICD-10-CM | POA: Diagnosis present

## 2017-09-19 DIAGNOSIS — I25110 Atherosclerotic heart disease of native coronary artery with unstable angina pectoris: Principal | ICD-10-CM | POA: Diagnosis present

## 2017-09-19 DIAGNOSIS — Z885 Allergy status to narcotic agent status: Secondary | ICD-10-CM

## 2017-09-19 DIAGNOSIS — Z8673 Personal history of transient ischemic attack (TIA), and cerebral infarction without residual deficits: Secondary | ICD-10-CM

## 2017-09-19 DIAGNOSIS — R079 Chest pain, unspecified: Secondary | ICD-10-CM | POA: Diagnosis not present

## 2017-09-19 HISTORY — DX: Hypothyroidism, unspecified: E03.9

## 2017-09-19 HISTORY — DX: Essential (primary) hypertension: I10

## 2017-09-19 HISTORY — DX: Type 2 diabetes mellitus without complications: E11.9

## 2017-09-19 HISTORY — DX: Hyperlipidemia, unspecified: E78.5

## 2017-09-19 LAB — LIPASE, BLOOD: Lipase: 46 U/L (ref 11–51)

## 2017-09-19 LAB — HEPATIC FUNCTION PANEL
ALT: 96 U/L — AB (ref 0–44)
AST: 48 U/L — ABNORMAL HIGH (ref 15–41)
Albumin: 3.4 g/dL — ABNORMAL LOW (ref 3.5–5.0)
Alkaline Phosphatase: 127 U/L — ABNORMAL HIGH (ref 38–126)
TOTAL PROTEIN: 6 g/dL — AB (ref 6.5–8.1)
Total Bilirubin: 0.4 mg/dL (ref 0.3–1.2)

## 2017-09-19 LAB — CBC
HCT: 37.8 % (ref 35.0–47.0)
HEMOGLOBIN: 12.6 g/dL (ref 12.0–16.0)
MCH: 27.2 pg (ref 26.0–34.0)
MCHC: 33.4 g/dL (ref 32.0–36.0)
MCV: 81.3 fL (ref 80.0–100.0)
Platelets: 177 10*3/uL (ref 150–440)
RBC: 4.65 MIL/uL (ref 3.80–5.20)
RDW: 14.7 % — ABNORMAL HIGH (ref 11.5–14.5)
WBC: 12.5 10*3/uL — ABNORMAL HIGH (ref 3.6–11.0)

## 2017-09-19 LAB — BASIC METABOLIC PANEL
ANION GAP: 5 (ref 5–15)
BUN: 20 mg/dL (ref 6–20)
CO2: 19 mmol/L — ABNORMAL LOW (ref 22–32)
Calcium: 8.2 mg/dL — ABNORMAL LOW (ref 8.9–10.3)
Chloride: 116 mmol/L — ABNORMAL HIGH (ref 98–111)
Creatinine, Ser: 1.56 mg/dL — ABNORMAL HIGH (ref 0.44–1.00)
GFR, EST AFRICAN AMERICAN: 44 mL/min — AB (ref >60)
GFR, EST NON AFRICAN AMERICAN: 38 mL/min — AB (ref >60)
Glucose, Bld: 236 mg/dL — ABNORMAL HIGH (ref 70–99)
Potassium: 3 mmol/L — ABNORMAL LOW (ref 3.5–5.1)
SODIUM: 140 mmol/L (ref 135–145)

## 2017-09-19 LAB — TROPONIN I
Troponin I: <0.03 ng/mL (ref <0.03)
Troponin I: <0.03 ng/mL (ref <0.03)
Troponin I: <0.03 ng/mL (ref <0.03)

## 2017-09-19 LAB — HEMOGLOBIN A1C
HEMOGLOBIN A1C: 6 % — AB (ref 4.8–5.6)
Mean Plasma Glucose: 125.5 mg/dL

## 2017-09-19 LAB — APTT: aPTT: 29 seconds (ref 24–36)

## 2017-09-19 LAB — GLUCOSE, CAPILLARY: GLUCOSE-CAPILLARY: 353 mg/dL — AB (ref 70–99)

## 2017-09-19 LAB — HEPARIN LEVEL (UNFRACTIONATED): HEPARIN UNFRACTIONATED: 0.21 [IU]/mL — AB (ref 0.30–0.70)

## 2017-09-19 LAB — PROTIME-INR
INR: 1.12
PROTHROMBIN TIME: 14.3 s (ref 11.4–15.2)

## 2017-09-19 LAB — POCT PREGNANCY, URINE: PREG TEST UR: NEGATIVE

## 2017-09-19 MED ORDER — POTASSIUM CHLORIDE CRYS ER 20 MEQ PO TBCR
40.0000 meq | EXTENDED_RELEASE_TABLET | Freq: Once | ORAL | Status: AC
  Administered 2017-09-20: 40 meq via ORAL
  Filled 2017-09-19: qty 2

## 2017-09-19 MED ORDER — SODIUM CHLORIDE 0.9% FLUSH: 3.0000 mL | INTRAVENOUS | Status: DC | PRN

## 2017-09-19 MED ORDER — ASPIRIN 81 MG PO CHEW
243.0000 mg | CHEWABLE_TABLET | Freq: Once | ORAL | Status: AC
  Administered 2017-09-19: 243 mg via ORAL
  Filled 2017-09-19: qty 3

## 2017-09-19 MED ORDER — ONDANSETRON HCL 4 MG/2ML IJ SOLN
4.0000 mg | Freq: Four times a day (QID) | INTRAMUSCULAR | Status: DC | PRN
  Administered 2017-09-19 – 2017-09-20 (×2): 4 mg via INTRAVENOUS
  Filled 2017-09-19 (×2): qty 2

## 2017-09-19 MED ORDER — ASPIRIN 81 MG PO CHEW
81.0000 mg | CHEWABLE_TABLET | ORAL | Status: AC
  Administered 2017-09-20: 81 mg via ORAL
  Filled 2017-09-19: qty 1

## 2017-09-19 MED ORDER — HEPARIN (PORCINE) IN NACL 100-0.45 UNIT/ML-% IJ SOLN
950.0000 [IU]/h | INTRAMUSCULAR | Status: DC
  Administered 2017-09-19: 800 [IU]/h via INTRAVENOUS
  Administered 2017-09-20: 950 [IU]/h via INTRAVENOUS
  Filled 2017-09-19 (×2): qty 250

## 2017-09-19 MED ORDER — CLOPIDOGREL BISULFATE 75 MG PO TABS
75.0000 mg | ORAL_TABLET | Freq: Every day | ORAL | Status: DC
  Administered 2017-09-20 – 2017-09-21 (×2): 75 mg via ORAL
  Filled 2017-09-19 (×2): qty 1

## 2017-09-19 MED ORDER — PYRIDOSTIGMINE BROMIDE 60 MG PO TABS
60.0000 mg | ORAL_TABLET | Freq: Three times a day (TID) | ORAL | Status: DC
  Administered 2017-09-19 – 2017-09-21 (×3): 60 mg via ORAL
  Filled 2017-09-19 (×7): qty 1

## 2017-09-19 MED ORDER — HEPARIN SODIUM (PORCINE) 5000 UNIT/ML IJ SOLN
4000.0000 [IU] | Freq: Once | INTRAMUSCULAR | Status: AC
  Administered 2017-09-19: 4000 [IU] via INTRAVENOUS

## 2017-09-19 MED ORDER — ACETAMINOPHEN 650 MG RE SUPP
650.0000 mg | Freq: Four times a day (QID) | RECTAL | Status: DC | PRN
Start: 2017-09-19 — End: 2017-09-21

## 2017-09-19 MED ORDER — MORPHINE SULFATE (PF) 2 MG/ML IV SOLN: 2.0000 mg | Freq: Once | INTRAVENOUS | Status: DC

## 2017-09-19 MED ORDER — INSULIN ASPART 100 UNIT/ML ~~LOC~~ SOLN
0.0000 [IU] | Freq: Every day | SUBCUTANEOUS | Status: DC
  Administered 2017-09-19: 5 [IU] via SUBCUTANEOUS
  Filled 2017-09-19: qty 1

## 2017-09-19 MED ORDER — MORPHINE SULFATE (PF) 4 MG/ML IV SOLN
4.0000 mg | INTRAVENOUS | Status: DC | PRN
  Administered 2017-09-20: 4 mg via INTRAVENOUS
  Filled 2017-09-19: qty 1

## 2017-09-19 MED ORDER — ALPRAZOLAM 1 MG PO TABS
1.0000 mg | ORAL_TABLET | Freq: Every day | ORAL | Status: DC | PRN
  Administered 2017-09-19 – 2017-09-20 (×2): 1 mg via ORAL
  Filled 2017-09-19 (×2): qty 1

## 2017-09-19 MED ORDER — NITROGLYCERIN 0.4 MG SL SUBL
0.4000 mg | SUBLINGUAL_TABLET | SUBLINGUAL | Status: DC | PRN
  Administered 2017-09-19 (×2): 0.4 mg via SUBLINGUAL
  Filled 2017-09-19: qty 1

## 2017-09-19 MED ORDER — ACETAMINOPHEN 325 MG PO TABS
650.0000 mg | ORAL_TABLET | Freq: Four times a day (QID) | ORAL | Status: DC | PRN
  Filled 2017-09-19: qty 2

## 2017-09-19 MED ORDER — MAGNESIUM OXIDE 400 (241.3 MG) MG PO TABS
400.0000 mg | ORAL_TABLET | Freq: Every day | ORAL | Status: DC
  Administered 2017-09-21: 400 mg via ORAL
  Filled 2017-09-19 (×2): qty 1

## 2017-09-19 MED ORDER — SODIUM CHLORIDE 0.9 % IV SOLN: 250.0000 mL | INTRAVENOUS | Status: DC | PRN

## 2017-09-19 MED ORDER — INSULIN ASPART 100 UNIT/ML ~~LOC~~ SOLN
0.0000 [IU] | Freq: Three times a day (TID) | SUBCUTANEOUS | Status: DC
  Administered 2017-09-20: 3 [IU] via SUBCUTANEOUS
  Filled 2017-09-19: qty 1

## 2017-09-19 MED ORDER — BUPROPION HCL ER (XL) 150 MG PO TB24
150.0000 mg | ORAL_TABLET | Freq: Every day | ORAL | Status: DC
  Administered 2017-09-20 – 2017-09-21 (×2): 150 mg via ORAL
  Filled 2017-09-19 (×2): qty 1

## 2017-09-19 MED ORDER — LEVOTHYROXINE SODIUM 25 MCG PO TABS
25.0000 ug | ORAL_TABLET | Freq: Every day | ORAL | Status: DC
  Administered 2017-09-20 – 2017-09-21 (×2): 25 ug via ORAL
  Filled 2017-09-19 (×2): qty 1

## 2017-09-19 MED ORDER — PANTOPRAZOLE SODIUM 40 MG PO TBEC
40.0000 mg | DELAYED_RELEASE_TABLET | Freq: Two times a day (BID) | ORAL | Status: DC
  Administered 2017-09-19 – 2017-09-21 (×3): 40 mg via ORAL
  Filled 2017-09-19 (×4): qty 1

## 2017-09-19 MED ORDER — ADULT MULTIVITAMIN W/MINERALS CH
1.0000 | ORAL_TABLET | Freq: Every day | ORAL | Status: DC
  Administered 2017-09-20 – 2017-09-21 (×2): 1 via ORAL
  Filled 2017-09-19 (×2): qty 1

## 2017-09-19 MED ORDER — METOPROLOL SUCCINATE ER 50 MG PO TB24
50.0000 mg | ORAL_TABLET | Freq: Every day | ORAL | Status: DC
  Administered 2017-09-20 – 2017-09-21 (×2): 50 mg via ORAL
  Filled 2017-09-19 (×2): qty 1

## 2017-09-19 MED ORDER — HYDRALAZINE HCL 50 MG PO TABS
50.0000 mg | ORAL_TABLET | Freq: Three times a day (TID) | ORAL | Status: DC
  Administered 2017-09-20 – 2017-09-21 (×4): 50 mg via ORAL
  Filled 2017-09-19 (×5): qty 1

## 2017-09-19 MED ORDER — SODIUM CHLORIDE 0.9 % IV BOLUS
500.0000 mL | Freq: Once | INTRAVENOUS | Status: AC
  Administered 2017-09-19: 500 mL via INTRAVENOUS

## 2017-09-19 MED ORDER — HYOSCYAMINE SULFATE 0.125 MG SL SUBL
0.1250 mg | SUBLINGUAL_TABLET | Freq: Four times a day (QID) | SUBLINGUAL | Status: DC | PRN
  Filled 2017-09-19: qty 1

## 2017-09-19 MED ORDER — CALCIUM CARBONATE-VITAMIN D 500-200 MG-UNIT PO TABS
1.0000 | ORAL_TABLET | Freq: Two times a day (BID) | ORAL | Status: DC
  Administered 2017-09-19 – 2017-09-21 (×3): 1 via ORAL
  Filled 2017-09-19 (×4): qty 1

## 2017-09-19 MED ORDER — SODIUM CHLORIDE 0.9 % IV SOLN
INTRAVENOUS | Status: DC
  Administered 2017-09-19: 21:00:00 via INTRAVENOUS

## 2017-09-19 MED ORDER — HEPARIN BOLUS VIA INFUSION
1000.0000 [IU] | Freq: Once | INTRAVENOUS | Status: AC
Start: 2017-09-19 — End: 2017-09-20
  Administered 2017-09-20: 1000 [IU] via INTRAVENOUS
  Filled 2017-09-19: qty 1000

## 2017-09-19 MED ORDER — ROSUVASTATIN CALCIUM 10 MG PO TABS
10.0000 mg | ORAL_TABLET | Freq: Every day | ORAL | Status: DC
  Administered 2017-09-19 – 2017-09-20 (×2): 10 mg via ORAL
  Filled 2017-09-19 (×2): qty 1

## 2017-09-19 MED ORDER — ASPIRIN 81 MG PO CHEW
81.0000 mg | CHEWABLE_TABLET | Freq: Every day | ORAL | Status: DC
  Administered 2017-09-21: 81 mg via ORAL
  Filled 2017-09-19: qty 1

## 2017-09-19 MED ORDER — SODIUM CHLORIDE 0.9 % WEIGHT BASED INFUSION: 1.0000 mL/kg/h | INTRAVENOUS | Status: DC

## 2017-09-19 MED ORDER — SODIUM CHLORIDE 0.9% FLUSH: 3.0000 mL | Freq: Two times a day (BID) | INTRAVENOUS | Status: DC

## 2017-09-19 MED ORDER — DILTIAZEM HCL ER COATED BEADS 120 MG PO CP24: 120.0000 mg | ORAL_CAPSULE | Freq: Every day | ORAL | Status: DC

## 2017-09-19 MED ORDER — GABAPENTIN 300 MG PO CAPS
300.0000 mg | ORAL_CAPSULE | Freq: Two times a day (BID) | ORAL | Status: DC
  Administered 2017-09-19 – 2017-09-21 (×3): 300 mg via ORAL
  Filled 2017-09-19 (×4): qty 1

## 2017-09-19 MED ORDER — CITALOPRAM HYDROBROMIDE 20 MG PO TABS
40.0000 mg | ORAL_TABLET | Freq: Every day | ORAL | Status: DC
  Administered 2017-09-20 – 2017-09-21 (×2): 40 mg via ORAL
  Filled 2017-09-19 (×2): qty 2

## 2017-09-19 MED ORDER — ONDANSETRON HCL 4 MG PO TABS: 4.0000 mg | ORAL_TABLET | Freq: Four times a day (QID) | ORAL | Status: DC | PRN

## 2017-09-19 MED ORDER — MORPHINE SULFATE (PF) 2 MG/ML IV SOLN
INTRAVENOUS | Status: AC
  Administered 2017-09-19: 2 mg via INTRAVENOUS
  Filled 2017-09-19: qty 1

## 2017-09-19 MED ORDER — PROMETHAZINE HCL 25 MG PO TABS
25.0000 mg | ORAL_TABLET | Freq: Four times a day (QID) | ORAL | Status: DC | PRN
  Filled 2017-09-19: qty 1

## 2017-09-19 NOTE — Consult Note (Signed)
CARDIOLOGY CONSULT NOTE  Patient ID: Dama Hedgepeth MRN: [id number] DOB/AGE: 49-30-1972 49 y.o.  Admit date: 09/19/2017 Primary Physician  No primary care provider on file. Primary Cardiologist   Ida Rogue, MD Chief Complaint  Chest pain Requesting  Dr. Karma Greaser  HPI:   The patient has a history of CAD with multiple caths and previous stent.  Her most recent cath is listed below.  She presents with chest pain.  She reports that this is similar to previous angina.  There are typical and atypical features.  It is worse with inspiration but it is happening with exertion and now at rest.  She reports a mid sternal sharp pain that is 10/10 at the peak.  It does go away with NTG .  However, she had to take five of these today to get the pain down from a 10 to a level 2.  She woke this morning with the pain which is why she came to the ED.  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  She said that she has been getting pain with the minimal activity required to take care of him.  This has been progressively more severe and frequent.  She has had some left arm pain and associated nausea.     Initial troponin is negative in the ED.  She did have some mild ST depression noted in the anterolateral leads.    Past Medical History:  Diagnosis Date  . Anemia    iron deficiency anemia  . Aortic arch aneurysm ([place])   . Bipolar disorder ([place])   . BRCA negative 2014  . CKD (chronic kidney disease)   . Colon polyp   . CVA (cerebral vascular accident) ([place])    Left side weakness.   . Diabetes ([place])   . Family history of breast cancer    BRCA neg 2014  . Gastric ulcer 04/27/2011  . HTN (hypertension)   . Hyperlipemia   . Hypothyroid   . Malignant melanoma of skin of scalp ([place])   . MI, acute, non ST segment elevation ([place])   . Neuromuscular disorder ([place])   . S/P drug eluting coronary stent placement 06/04/2015  . Sepsis ([place]) 03/18/2017    Past Surgical  History:  Procedure Laterality Date  . APPENDECTOMY    . CARDIAC CATHETERIZATION N/A 11/09/2014   Procedure: Coronary Angiography;  Surgeon: Minna Merritts, MD;  Location: [facility];  Service: Cardiovascular;  Laterality: N/A;  . CARDIAC CATHETERIZATION N/A 11/12/2014   Procedure: Coronary Stent Intervention;  Surgeon: Isaias Cowman, MD;  Location: [facility];  Service: Cardiovascular;  Laterality: N/A;  . CARDIAC CATHETERIZATION N/A 04/18/2015   Procedure: Left Heart Cath and Coronary Angiography;  Surgeon: Minna Merritts, MD;  Location: [facility];  Service: Cardiovascular;  Laterality: N/A;  . CARDIAC CATHETERIZATION Left 06/04/2015   Procedure: Left Heart Cath and Coronary Angiography;  Surgeon: Wellington Hampshire, MD;  Location: [facility];  Service: Cardiovascular;  Laterality: Left;  . CARDIAC CATHETERIZATION N/A 06/04/2015   Procedure: Coronary Stent Intervention;  Surgeon: Wellington Hampshire, MD;  Location: [place] [facility];  Service: Cardiovascular;  Laterality: N/A;  . CESAREAN SECTION  2001  . CHOLECYSTECTOMY N/A 11/18/2016   Procedure: LAPAROSCOPIC CHOLECYSTECTOMY WITH INTRAOPERATIVE CHOLANGIOGRAM;  Surgeon: Christene Lye, MD;  Location: [facility];  Service: General;  Laterality: N/A;  . COLONOSCOPY WITH PROPOFOL N/A 04/27/2016  Procedure: COLONOSCOPY WITH PROPOFOL;  Surgeon: [name], MD;  Location: [place];  Service: Endoscopy;  Laterality: N/A;  . CORONARY ANGIOPLASTY    . CORONARY BALLOON ANGIOPLASTY N/A 06/29/2017   Procedure: CORONARY BALLOON ANGIOPLASTY;  Surgeon: Wellington Hampshire, MD;  Location: [facility];  Service: Cardiovascular;  Laterality: N/A;  . DILATION AND CURETTAGE OF UTERUS    . ESOPHAGOGASTRODUODENOSCOPY (EGD) WITH PROPOFOL N/A 09/14/2014   Procedure: ESOPHAGOGASTRODUODENOSCOPY (EGD) WITH PROPOFOL;  Surgeon: Josefine Class, MD;  Location[facility];  Service: Endoscopy;   Laterality: N/A;  . ESOPHAGOGASTRODUODENOSCOPY (EGD) WITH PROPOFOL N/A 04/27/2016   Procedure: ESOPHAGOGASTRODUODENOSCOPY (EGD) WITH PROPOFOL;  Surgeon: [name], MD;  Location: [place];  Service: Endoscopy;  Laterality: N/A;  Diabetic - oral meds  . GASTRIC BYPASS  09/2009   [facility]   . Left Carotid to sublcavian artery bypass w/ subclavian artery ligation     a. Performed @ [place].  . LEFT HEART CATH AND CORONARY ANGIOGRAPHY Left 06/29/2017   Procedure: LEFT HEART CATH AND CORONARY ANGIOGRAPHY;  Surgeon: Wellington Hampshire, MD;  Location: [place] [facility];  Service: Cardiovascular;  Laterality: Left;  Marland Kitchen MELANOMA EXCISION  2016   Dr. Evorn Gong  . [place]  2002  . RIGHT OOPHORECTOMY    . SHOULDER ARTHROSCOPY WITH OPEN ROTATOR CUFF REPAIR Right 01/07/2016   Procedure: SHOULDER ARTHROSCOPY WITH DEBRIDMENT, SUBACHROMIAL DECOMPRESSION;  Surgeon: Corky Mull, MD;  Location: [facility];  Service: Orthopedics;  Laterality: Right;  . SHOULDER ARTHROSCOPY WITH OPEN ROTATOR CUFF REPAIR Right 03/16/2017   Procedure: SHOULDER ARTHROSCOPY WITH OPEN ROTATOR CUFF REPAIR POSSIBLE BICEPS TENODESIS;  Surgeon: Corky Mull, MD;  Location: [facility];  Service: Orthopedics;  Laterality: Right;  . TRIGGER FINGER RELEASE Right     Middle Finger    Allergies  Allergen Reactions  . Lipitor [Atorvastatin] Other (See Comments)    Leg pains  . Tramadol Other (See Comments)    Mouth feels like it's on fire   Prior to Admission medications   Medication Sig Start Date End Date Taking? Authorizing Provider  ALPRAZolam Duanne Moron) 1 MG tablet Take 1 tablet (1 mg total) by mouth daily as needed for anxiety. 08/18/17   [name], MD  aspirin 81 MG chewable tablet Chew 1 tablet (81 mg total) by mouth daily. 07/01/17   Dunn, Areta Haber, PA-C  Blood Glucose Monitoring Suppl (ONE TOUCH ULTRA 2) w/Device KIT Use to check blood sugar once a day. Dx. E11.9 03/18/17   [name], MD    buPROPion (WELLBUTRIN XL) 150 MG 24 hr tablet TAKE 1 TABLET BY MOUTH DAILY 08/10/17   [name], MD  Calcium Carbonate-Vitamin D (CALCIUM 500/D PO) Take 500 mg by mouth.    [provider]  celecoxib (CELEBREX) 200 MG capsule Take 200 mg by mouth 2 (two) times daily as needed for moderate pain.     [provider]  citalopram (CELEXA) 40 MG tablet Take 1 tablet (40 mg total) by mouth daily. 06/08/17   [name], MD  clopidogrel (PLAVIX) 75 MG tablet Take 1 tablet (75 mg total) by mouth daily with breakfast. 07/01/17   Dunn, Areta Haber, PA-C  Cyanocobalamin (VITAMIN B 12 PO) Take 1,000 mcg by mouth.    [provider]  diltiazem (CARDIZEM CD) 120 MG 24 hr capsule Take 1 capsule (120 mg total) by mouth daily. 07/09/17 10/07/17  Rise Mu, PA-C  gabapentin (  NEURONTIN) 300 MG capsule Take 1 capsule (300 mg total) by mouth 2 (two) times daily. 06/04/17   [name], MD  hydrALAZINE (APRESOLINE) 50 MG tablet Take 1 tablet (50 mg total) by mouth 3 (three) times daily. 06/18/17 09/16/17  Rise Mu, PA-C  hyoscyamine (LEVSIN SL) 0.125 MG SL tablet Place 1 tablet (0.125 mg total) under the tongue every 6 (six) hours as needed. Patient taking differently: Place 0.125 mg under the tongue every 6 (six) hours as needed for cramping.  02/01/17   [name], MD  levothyroxine (SYNTHROID, LEVOTHROID) 25 MCG tablet Take 1 tablet (25 mcg total) daily before breakfast by mouth. 12/18/16   [name], MD  losartan (COZAAR) 100 MG tablet Take 1 tablet (100 mg) by mouth once daily    [provider]  magnesium oxide (MAG-OX) 400 MG tablet Take 1 tablet (400 mg total) by mouth daily. 07/12/17   Dunn, Areta Haber, PA-C  metoprolol succinate (TOPROL-XL) 50 MG 24 hr tablet Take 1 tablet (50 mg total) by mouth daily. Take with or immediately following a meal. 07/01/17   Dunn, Areta Haber, PA-C  Multiple Vitamin (MULTIVITAMIN WITH MINERALS) TABS tablet Take 1 tablet by mouth daily.  04/14/17   Gouru, Illene Silver, MD  nitroGLYCERIN (NITROSTAT) 0.4 MG SL tablet Place 1 tablet (0.4 mg total) under the tongue every 5 (five) minutes as needed for chest pain. 06/02/17   Minna Merritts, MD  pantoprazole (PROTONIX) 40 MG tablet Take 1 tablet (40 mg total) by mouth 2 (two) times daily. 05/11/16   [name], MD  pioglitazone (ACTOS) 15 MG tablet Take by mouth. 08/03/17 08/03/18  [provider]  predniSONE (DELTASONE) 10 MG tablet Taper daily as follows: 6 pills, 5, 4, 3, 2, 1 09/07/17   Carmon Ginsberg, PA  promethazine (PHENERGAN) 25 MG tablet Take 25 mg by mouth every 6 (six) hours as needed for nausea or vomiting.     [provider]  pyridostigmine (MESTINON) 60 MG tablet Take by mouth. 07/26/17   [provider]  rosuvastatin (CRESTOR) 10 MG tablet Take 1 tablet (10 mg total) by mouth daily. 08/10/17   Minna Merritts, MD     (Not in a hospital admission) Family History  Problem Relation Age of Onset  . Hypertension Mother   . Anxiety disorder Mother   . Depression Mother   . Bipolar disorder Mother   . Heart disease Mother        No details  . Hyperlipidemia Mother   . Kidney disease Father   . Heart disease Father 45  . Hypertension Father   . Diabetes Father   . Stroke Father   . Colon cancer Father        dx in his 33's  . Anxiety disorder Father   . Depression Father   . Skin cancer Father   . Kidney disease Sister   . Thyroid nodules Sister   . Hypertension Sister   . Hypertension Sister   . Diabetes Sister   . Hyperlipidemia Sister   . Depression Sister   . Breast cancer Maternal Aunt 18  . Breast cancer Maternal Aunt 78  . Ovarian cancer Cousin   . Colon cancer Cousin   . Breast cancer Other   . Kidney cancer Neg Hx   . Bladder Cancer Neg Hx     Social History   Socioeconomic History  . Marital status: Divorced    Spouse name: Not on  file  . Number of children: 1  . Years of education: Not on file  . Highest education  level: Not on file  Occupational History  . 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 Disabled through 08/02/2013 per forms 02/03/2013  Social Needs  . Financial resource strain: Not on file  . Food insecurity:    Worry: Not on file    Inability: Not on file  . Transportation needs:    Medical: Not on file    Non-medical: Not on file  Tobacco Use  . Smoking status: Former Smoker    Types: Cigarettes    Last attempt to quit: 08/31/1994    Years since quitting: 23.0  . Smokeless tobacco: Never Used  . Tobacco comment: quit 28 years ago  Substance and Sexual Activity  . Alcohol use: No    Alcohol/week: 0.0 standard drinks  . Drug use: No  . Sexual activity: Not Currently    Birth control/protection: None  Lifestyle  . Physical activity:    Days per week: Not on file    Minutes per session: Not on file  . Stress: Not on file  Relationships  . Social connections:    Talks on phone: Not on file    Gets together: Not on file    Attends religious service: Not on file    Active member of club or organization: Not on file    Attends meetings of clubs or organizations: Not on file    Relationship status: Not on file  . Intimate partner violence:    Fear of current or ex partner: Not on file    Emotionally abused: Not on file    Physically abused: Not on file    Forced sexual activity: Not on file  Other Topics Concern  . 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     ROS:  Positive for diarrhea.  Otherwise as stated in the HPI and negative for all other systems.  Physical Exam: Blood pressure (!) 175/86, pulse 72, temperature 98.7 F (37.1 C), temperature source Oral, resp. rate 15, height '5\' 2"'  (1.575 m), weight 77.1 kg, SpO2 98 %.  GENERAL:  Well appearing HEENT:  Pupils equal round and  reactive, fundi not visualized, oral mucosa unremarkable NECK:  No jugular venous distention, waveform within normal limits, carotid upstroke brisk and symmetric, no bruits, no thyromegaly LYMPHATICS:  No cervical, inguinal adenopathy LUNGS:  Clear to auscultation bilaterally BACK:  No CVA tenderness CHEST:  Unremarkable HEART:  PMI not displaced or sustained,S1 and S2 within normal limits, no S3, no S4, no clicks, no rubs, no murmurs ABD:  Flat, positive bowel sounds normal in frequency in pitch, no bruits, no rebound, no guarding, no midline pulsatile mass, no hepatomegaly, no splenomegaly EXT:  2 plus pulses throughout, no edema, no cyanosis no clubbing SKIN:  No rashes no nodules NEURO:  Cranial nerves II through XII grossly intact, motor grossly intact throughout PSYCH:  Cognitively intact, oriented to person place and time   Labs: Lab Results  Component Value Date   BUN 20 09/19/2017   Lab Results  Component Value Date   CREATININE 1.56 (H) 09/19/2017   Lab Results  Component Value Date   NA 140 09/19/2017   K 3.0 (L) 09/19/2017   CL 116 (H) 09/19/2017  CO2 19 (L) 09/19/2017   Lab Results  Component Value Date   TROPONINI <0.03 09/19/2017   Lab Results  Component Value Date   WBC 12.5 (H) 09/19/2017   HGB 12.6 09/19/2017   HCT 37.8 09/19/2017   MCV 81.3 09/19/2017   PLT 177 09/19/2017   Lab Results  Component Value Date   CHOL 134 04/10/2017   HDL 29 (L) 04/10/2017   LDLCALC 88 04/10/2017   LDLDIRECT 83.3 01/26/2011   TRIG 85 04/10/2017   CHOLHDL 4.6 04/10/2017   Lab Results  Component Value Date   ALT 96 (H) 09/19/2017   AST 48 (H) 09/19/2017   ALKPHOS 127 (H) 09/19/2017   BILITOT 0.4 09/19/2017   Lab Results  Component Value Date   TSH 1.013 03/19/2017    Cath 06/29/17 :    Prox Cx lesion is 40% stenosed.  Previously placed 1st Diag stent (unknown type) is widely patent.  Mid LAD lesion is 5% stenosed.  Ost 1st Diag lesion is 40%  stenosed.  Ost 2nd Diag to 2nd Diag lesion is 95% stenosed.  Dist LAD lesion is 70% stenosed.  Post intervention, there is a 30% residual stenosis.  Balloon angioplasty was performed using a BALLOON TREK RX 2.5X12.   1. Significant one-vessel coronary artery disease with patent stents in the mid LAD and first diagonal.  There is significant disease in the distal LAD close to the apex.  There is also severe ostial stenosis in the second diagonal which is new.  The  previous LAD stent is placed just distal to the origin of the second diagonal. 2.  Mildly elevated left ventricular end-diastolic pressure.  Left ventricular angiography was not performed due to chronic kidney disease. 3.  Successful balloon angioplasty of the ostial second diagonal without stent placement.  Not optimal location to place and stent in the future due to angulation from the LAD and the presence of a stent in the LAD in that area.   Radiology:   CXR:  Pending  EKG: Sinus tach, rate 108, axis WNL, intervals WNL, QT prolonged.  ST depression I, aVL, V4 - V6, consider ischemia.   ASSESSMENT AND PLAN:   UNSTABLE ANGINA:  The pain has typical and atypical features.   Need to assume that this is unstable angina.  Admit and cycle enzymes.  Heparin, ASA and beta blocker.  Keep NPO after MN and I will discuss with the team need for cath.  Hydrate overnight for CKD.    CKD:  Creat improved compared with baseline.  Hydrate overnight and limit dye.  I suggest holding the ACE inhibitor in the morning.  DYSLIPIDEMIA:  Repeat AM lipid profile.  Crestor was increased several weeks ago.     DM:  Per primary team.    Signed: Minus Breeding 09/19/2017, 4:50 PM

## 2017-09-19 NOTE — ED Notes (Signed)
Dr. Leslye Peer at bedside currently.

## 2017-09-19 NOTE — Progress Notes (Signed)
Patient ID: [name], female   DOB: 07/26/1970, 47 y.o.   MRN: [id number]  ACP note  Patient and son at the bedside  Diagnosis: Unstable angina with history of CAD, chronic kidney disease stage III, hypertension, hyperlipidemia, type 2 diabetes mellitus, history of CVA, hypothyroidism, history of melanoma.  CODE STATUS discussed and patient wishes to be a full code.  Plan.  Serial cardiac enzymes, monitor on telemetry, cardiac catheterization tomorrow, heparin drip, aspirin, Plavix, Toprol and Crestor.  Pain could also be musculoskeletal in nature since the patient was taking prednisone previously and when she stopped taking the prednisone then the pain occurred.  Patient did not want any further prednisone at this point.  Can consider going back on the Celebrex after cardiac catheterization.  Time spent on ACP discussion 17 minutes Dr. Loletha Grayer

## 2017-09-19 NOTE — Progress Notes (Signed)
[facility] NAME: [name]    MR#:  [id number]  DATE OF BIRTH:  [birth year]  SUBJECTIVE:  CHIEF COMPLAINT: Hospitalist team is called as the patient is having crushing chest pain.  Twelve-lead EKG has revealed ST depressions  REVIEW OF SYSTEMS:  CONSTITUTIONAL: No fever, fatigue or weakness.  EYES: No blurred or double vision.  EARS, NOSE, AND THROAT: No tinnitus or ear pain.  RESPIRATORY: No cough, shortness of breath, wheezing or hemoptysis.  CARDIOVASCULAR: Patient is reporting chest pain, denies orthopnea, edema.  GASTROINTESTINAL: No nausea, vomiting, diarrhea or abdominal pain.  GENITOURINARY: No dysuria, hematuria.  ENDOCRINE: No polyuria, nocturia,  HEMATOLOGY: No anemia, easy bruising or bleeding SKIN: No rash or lesion. MUSCULOSKELETAL: No joint pain or arthritis.   NEUROLOGIC: No tingling, numbness, weakness.  PSYCHIATRY: No anxiety or depression.   DRUG ALLERGIES:   Allergies  Allergen Reactions  . Lipitor [Atorvastatin] Other (See Comments)    Leg pains  . Tramadol Other (See Comments)    Mouth feels like it's on fire    VITALS:  Blood pressure (!) 144/75, pulse 80, temperature 98 F (36.7 C), temperature source Oral, resp. rate 18, height 5\' 2"  (1.575 m), weight 80.6 kg, SpO2 100 %.  PHYSICAL EXAMINATION:  GENERAL:  47 y.o.-year-old patient lying in the bed with no acute distress.  EYES: Pupils equal, round, reactive to light and accommodation. No scleral icterus. Extraocular muscles intact.  HEENT: Head atraumatic, normocephalic. Oropharynx and nasopharynx clear.  NECK:  Supple, no jugular venous distention. No thyroid enlargement, no tenderness.  LUNGS: Normal breath sounds bilaterally, no wheezing, rales,rhonchi or crepitation. No use of accessory muscles of respiration.  CARDIOVASCULAR: S1, S2 normal. No murmurs, rubs, or gallops.  No reproducible chest wall tenderness ABDOMEN: Soft,  nontender, nondistended. Bowel sounds present.   EXTREMITIES: No pedal edema, cyanosis, or clubbing.  NEUROLOGIC: Cranial nerves II through XII are intact. Muscle strength 5/5 in all extremities. Sensation intact. Gait not checked.  PSYCHIATRIC: The patient is alert and oriented x 3.  SKIN: No obvious rash, lesion, or ulcer.    LABORATORY PANEL:   CBC Recent Labs  Lab 09/19/17 1419  WBC 12.5*  HGB 12.6  HCT 37.8  PLT 177   ------------------------------------------------------------------------------------------------------------------  Chemistries  Recent Labs  Lab 09/19/17 1419 09/19/17 1558  NA 140  --   K 3.0*  --   CL 116*  --   CO2 19*  --   GLUCOSE 236*  --   BUN 20  --   CREATININE 1.56*  --   CALCIUM 8.2*  --   AST  --  48*  ALT  --  96*  ALKPHOS  --  127*  BILITOT  --  0.4   ------------------------------------------------------------------------------------------------------------------  Cardiac Enzymes Recent Labs  Lab 09/19/17 1912  TROPONINI <0.03   ------------------------------------------------------------------------------------------------------------------  RADIOLOGY:  Dg Chest Portable 1 View  Result Date: 09/19/2017 CLINICAL DATA:  Acute chest pain EXAM: PORTABLE CHEST 1 VIEW COMPARISON:  03/18/2017 and prior exams FINDINGS: Cardiomediastinal silhouette is unchanged. Surgical clips overlying the UPPER LEFT chest/LOWER neck noted. Mild elevation of the RIGHT hemidiaphragm again noted. There is no evidence of focal airspace disease, pulmonary edema, suspicious pulmonary nodule/mass, pleural effusion, or pneumothorax. No acute bony abnormalities are identified. IMPRESSION: No active disease. Electronically Signed   By: Margarette Canada M.D.   On: 09/19/2017 16:36    EKG:   Orders placed or performed during the hospital  encounter of 09/19/17  . ED EKG within 10 minutes  . ED EKG within 10 minutes  . EKG 12-Lead  . EKG 12-Lead    ASSESSMENT  AND PLAN:   1. unstable angina, history of CAD.  First 2 sets of troponins are negative Repeat stat twelve-lead EKG with new ST depressions Plan is to continue Aspirin, heparin drip, Plavix, Toprol-XL, Crestor prescribed. Morphine IV as needed for chest pain, will consider IV nitroglycerin drip and transferring the patient to stepdown unit if chest pain is not relieved with IV morphine   Check lipid profile in the a.m.   N.p.o. after midnight for cardiac catheterization tomorrow morning.   IV fluids overnight.  Plan of care discussed with the patient and on-call cardiologist; [facility]   2.  Chronic kidney disease stage III.  Monitor daily after cardiac catheterization.  IV fluids overnight hold losartan. 3.  Diarrhea.  Send off stool studies. 4.  Hypokalemia replace potassium orally       All the records are reviewed and case discussed with Care Management/Social Workerr. Management plans discussed with the patient, family and they are in agreement.  CODE STATUS: fc  TOTAL critical careTIME TAKING CARE OF THIS PATIENT: 36  minutes.   Note: This dictation was prepared with Dragon dictation along with smaller phrase technology. Any transcriptional errors that result from this process are unintentional.   Nicholes Mango M.D on 09/19/2017 at 11:15 PM  Between 7am to 6pm - Pager - [phone] After 6pm go to [email or web address] - password [email or web address] [facility]  [facility]  Office  [phone]  CC: Primary care physician; No primary care provider on file.

## 2017-09-19 NOTE — Progress Notes (Signed)
ANTICOAGULATION CONSULT NOTE - Initial Consult  Pharmacy Consult for heparin Indication: chest pain/ACS  Allergies  Allergen Reactions  . Lipitor [Atorvastatin] Other (See Comments)    Leg pains  . Tramadol Other (See Comments)    Mouth feels like it's on fire    Patient Measurements: Height: '5\' 2"'  (157.5 cm) Weight: 170 lb (77.1 kg) IBW/kg (Calculated) : 50.1 Heparin Dosing Weight: 67 kg  Vital Signs: Temp: 98.7 F (37.1 C) (08/18 1420) Temp Source: Oral (08/18 1420) BP: 175/86 (08/18 1550) Pulse Rate: 72 (08/18 1550)  Labs: Recent Labs    09/19/17 1419 09/19/17 1558  HGB 12.6  --   HCT 37.8  --   PLT 177  --   APTT  --  29  LABPROT  --  14.3  INR  --  1.12  CREATININE 1.56*  --   TROPONINI <0.03  --     Estimated Creatinine Clearance: 42.4 mL/min (A) (by C-G formula based on SCr of 1.56 mg/dL (H)).   Medical History: Past Medical History:  Diagnosis Date  . Anemia    iron deficiency anemia  . Aortic arch aneurysm ([place])   . Bipolar disorder ([place])   . BRCA negative 2014  . Colon polyp   . Family history of breast cancer    BRCA neg 2014  . Gastric ulcer 04/27/2011  . Headache    Migraines  . Malignant melanoma of skin of scalp ([place])   . MI, acute, non ST segment elevation ([place])   . Morbid obesity ([place])    a. s/p gastric bypass.  . Neuromuscular disorder ([place])   . S/P drug eluting coronary stent placement 06/04/2015  . Sepsis ([place]) 03/18/2017   Assessment: 47 year old female presenting with unstable angina. Pt reports no anticoagulation PTA, she takes Plavix  Goal of Therapy:  Heparin level 0.3-0.7 units/ml Monitor platelets by anticoagulation protocol: Yes   Plan:  Heparin 4000 unit bolus x 1 Will start heparin infusion at 800 units/hr Will order HL for 8/18 @ 2300  Pharmacy will continue to follow.  Tawnya Crook, PharmD Pharmacy Resident  09/19/2017 4:33 PM

## 2017-09-19 NOTE — ED Triage Notes (Signed)
Pt arrives to ED c/o of central CP that is non radiating. States pain x 5 days. Has been taking nitro all week. States took 5 in 30 minutes at home today. When asked about SOB, N&V, diaphoresis, dizziness, pt just nods head. When asked which ones pt states "all". No distress noted at this time. Alert, oriented.

## 2017-09-19 NOTE — Progress Notes (Signed)
Pt with chest pain 10/10. Ekg. Ntg under tongue. Morphine 2mg  x 1. Dry heeves. Morphine 2mg  x1. O2 applied.Doctor called. Doctor up to see pt. Orders placed. Chest pain coming down. Will continue to monitor and assess.

## 2017-09-19 NOTE — ED Provider Notes (Signed)
[facility] Emergency Department Provider Note  ____________________________________________   First MD Initiated Contact with Patient 09/19/17 1538     (approximate)  I have reviewed the triage vital signs and the nursing notes.   HISTORY  Chief Complaint Chest Pain    HPI [name] [name] is a 47 y.o. female with significant chronic coronary artery disease who is most recent left heart catheterization was less than 3 months ago and showed severe single-vessel disease as well as significant occlusion of multiple other vessels (see below).  She presents by private vehicle today for evaluation of worsening episodic chest pressure and pain accompanied with shortness of breath.  The symptoms are severe and have been gradually getting worse over the last week.  Before she would have occasional chest pain but it is become much more severe over the last week with multiple episodes per day.  She reports that she is taken maybe 10 nitroglycerin over the last week but today alone she has taken 5 nitroglycerin which briefly makes the chest pressure feel better but it comes back with any amount of exertion including just walking to the bathroom.  She denies any recent illnesses, specifically denying fever/chills, cough, shortness of breath not associated with exertion and the chest pressure.  She denies dysuria and abdominal pain.  She says there is no chance she could be pregnant.  She reports compliance with her daily baby aspirin and 75 mg Plavix and she took it earlier today.  Past Medical History:  Diagnosis Date  . Anemia    iron deficiency anemia  . Aortic arch aneurysm ([place])   . Bipolar disorder ([place])   . BRCA negative 2014  . Colon polyp   . Family history of breast cancer    BRCA neg 2014  . Gastric ulcer 04/27/2011  . Headache    Migraines  . Malignant melanoma of skin of scalp ([place])   . MI, acute, non ST segment elevation ([place])   . Morbid obesity ([place])     a. s/p gastric bypass.  . Neuromuscular disorder ([place])   . S/P drug eluting coronary stent placement 06/04/2015  . Sepsis ([place]) 03/18/2017    Patient Active Problem List   Diagnosis Date Noted  . Effort angina ([place]) 06/29/2017  . Unstable angina ([place]) 06/24/2017  . Syncope 04/09/2017  . Insomnia 03/18/2017  . Ischemic cardiomyopathy   . Arthritis   . Anxiety   . Acute colitis 01/27/2017  . Hx of colonic polyps   . Bariatric surgery status   . Sinus tachycardia 02/27/2016  . H/O medication noncompliance 12/14/2015  . Acute pyelonephritis   . Emesis   . CKD (chronic kidney disease), stage IV ([place])   . CAD (coronary artery disease)   . Hypertensive heart disease   . CKD (chronic kidney disease) stage 3, GFR 30-59 ml/min (HCC) 06/05/2015  . Presence of drug coated stent in LAD coronary artery & now in D1 06/04/2015  . Colitis 06/03/2015  . Carotid stenosis 04/30/2015  . Type 2 diabetes mellitus with complication, without long-term current use of insulin ([place])   . Atypical chest pain   . Iron deficiency anemia 03/22/2015  . Vitamin B12 deficiency 02/18/2015  . Misuse of medications for pain 02/18/2015  . Severe recurrent major depression without psychotic features ([place]) 02/15/2015  . Helicobacter pylori infection 11/23/2014  . Hemiparesis, left ([place]) 11/23/2014  . Benign neoplasm of colon 11/20/2014  . Malignant melanoma ([place]) 08/25/2014  . Chronic systolic  CHF (congestive heart failure) ([place])   . Incomplete bladder emptying 07/12/2014  . Adult hypothyroidism 12/30/2013  . Aberrant subclavian artery 11/17/2013  . Multiple sclerosis ([place]) 11/02/2013  . History of CVA (cerebrovascular accident) 06/20/2013  . Headache, migraine 05/29/2013  . Hyperlipidemia   . GERD (gastroesophageal reflux disease)   . Neuropathy ([place]) 01/02/2011  . CVA (cerebral vascular accident) ([place]) 06/21/2008  . Essential hypertension 05/01/2008    Past Surgical History:  Procedure Laterality  Date  . APPENDECTOMY    . CARDIAC CATHETERIZATION N/A 11/09/2014   Procedure: Coronary Angiography;  Surgeon: Minna Merritts, MD;  Location: [facility];  Service: Cardiovascular;  Laterality: N/A;  . CARDIAC CATHETERIZATION N/A 11/12/2014   Procedure: Coronary Stent Intervention;  Surgeon: Isaias Cowman, MD;  Location: [facility];  Service: Cardiovascular;  Laterality: N/A;  . CARDIAC CATHETERIZATION N/A 04/18/2015   Procedure: Left Heart Cath and Coronary Angiography;  Surgeon: Minna Merritts, MD;  Location: [facility];  Service: Cardiovascular;  Laterality: N/A;  . CARDIAC CATHETERIZATION Left 06/04/2015   Procedure: Left Heart Cath and Coronary Angiography;  Surgeon: Wellington Hampshire, MD;  Location: [facility];  Service: Cardiovascular;  Laterality: Left;  . CARDIAC CATHETERIZATION N/A 06/04/2015   Procedure: Coronary Stent Intervention;  Surgeon: Wellington Hampshire, MD;  Location: [facility];  Service: Cardiovascular;  Laterality: N/A;  . CESAREAN SECTION  2001  . CHOLECYSTECTOMY N/A 11/18/2016   Procedure: LAPAROSCOPIC CHOLECYSTECTOMY WITH INTRAOPERATIVE CHOLANGIOGRAM;  Surgeon: Christene Lye, MD;  Location: [facility];  Service: General;  Laterality: N/A;  . COLONOSCOPY WITH PROPOFOL N/A 04/27/2016   Procedure: COLONOSCOPY WITH PROPOFOL;  Surgeon: [name], MD;  Location: [facility];  Service: Endoscopy;  Laterality: N/A;  . CORONARY ANGIOPLASTY    . CORONARY BALLOON ANGIOPLASTY N/A 06/29/2017   Procedure: CORONARY BALLOON ANGIOPLASTY;  Surgeon: Wellington Hampshire, MD;  Location: [facility];  Service: Cardiovascular;  Laterality: N/A;  . DILATION AND CURETTAGE OF UTERUS    . ESOPHAGOGASTRODUODENOSCOPY (EGD) WITH PROPOFOL N/A 09/14/2014   Procedure: ESOPHAGOGASTRODUODENOSCOPY (EGD) WITH PROPOFOL;  Surgeon: Josefine Class, MD;  Location: [facility];  Service: Endoscopy;  Laterality: N/A;  .  ESOPHAGOGASTRODUODENOSCOPY (EGD) WITH PROPOFOL N/A 04/27/2016   Procedure: ESOPHAGOGASTRODUODENOSCOPY (EGD) WITH PROPOFOL;  Surgeon: [name], MD;  Location: [facility];  Service: Endoscopy;  Laterality: N/A;  Diabetic - oral meds  . GASTRIC BYPASS  09/2009   [facility]   . Left Carotid to sublcavian artery bypass w/ subclavian artery ligation     a. Performed @ [facility].  . LEFT HEART CATH AND CORONARY ANGIOGRAPHY Left 06/29/2017   Procedure: LEFT HEART CATH AND CORONARY ANGIOGRAPHY;  Surgeon: Wellington Hampshire, MD;  Location: [facility];  Service: Cardiovascular;  Laterality: Left;  Marland Kitchen MELANOMA EXCISION  2016   Dr. Evorn Gong  . [place]  2002  . RIGHT OOPHORECTOMY    . SHOULDER ARTHROSCOPY WITH OPEN ROTATOR CUFF REPAIR Right 01/07/2016   Procedure: SHOULDER ARTHROSCOPY WITH DEBRIDMENT, SUBACHROMIAL DECOMPRESSION;  Surgeon: Corky Mull, MD;  Location: [facility];  Service: Orthopedics;  Laterality: Right;  . SHOULDER ARTHROSCOPY WITH OPEN ROTATOR CUFF REPAIR Right 03/16/2017   Procedure: SHOULDER ARTHROSCOPY WITH OPEN ROTATOR CUFF REPAIR POSSIBLE BICEPS TENODESIS;  Surgeon: Corky Mull, MD;  Location: [facility];  Service: Orthopedics;  Laterality: Right;  . TRIGGER FINGER RELEASE Right     [place]  Prior to Admission medications   Medication Sig Start Date End Date Taking? Authorizing Provider  ALPRAZolam Duanne Moron) 1 MG tablet Take 1 tablet (1 mg total) by mouth daily as needed for anxiety. 08/18/17   [name], MD  aspirin 81 MG chewable tablet Chew 1 tablet (81 mg total) by mouth daily. 07/01/17   Dunn, Areta Haber, PA-C  Blood Glucose Monitoring Suppl (ONE TOUCH ULTRA 2) w/Device KIT Use to check blood sugar once a day. Dx. E11.9 03/18/17   [name], MD  buPROPion (WELLBUTRIN XL) 150 MG 24 hr tablet TAKE 1 TABLET BY MOUTH DAILY 08/10/17   [name], MD  Calcium Carbonate-Vitamin D (CALCIUM 500/D PO) Take 500 mg by mouth.     [provider]  celecoxib (CELEBREX) 200 MG capsule Take 200 mg by mouth 2 (two) times daily as needed for moderate pain.     [provider]  citalopram (CELEXA) 40 MG tablet Take 1 tablet (40 mg total) by mouth daily. 06/08/17   [name], MD  clopidogrel (PLAVIX) 75 MG tablet Take 1 tablet (75 mg total) by mouth daily with breakfast. 07/01/17   Dunn, Areta Haber, PA-C  Cyanocobalamin (VITAMIN B 12 PO) Take 1,000 mcg by mouth.    [provider]  diltiazem (CARDIZEM CD) 120 MG 24 hr capsule Take 1 capsule (120 mg total) by mouth daily. 07/09/17 10/07/17  Rise Mu, PA-C  gabapentin (NEURONTIN) 300 MG capsule Take 1 capsule (300 mg total) by mouth 2 (two) times daily. 06/04/17   [name], MD  hydrALAZINE (APRESOLINE) 50 MG tablet Take 1 tablet (50 mg total) by mouth 3 (three) times daily. 06/18/17 09/16/17  Rise Mu, PA-C  hyoscyamine (LEVSIN SL) 0.125 MG SL tablet Place 1 tablet (0.125 mg total) under the tongue every 6 (six) hours as needed. Patient taking differently: Place 0.125 mg under the tongue every 6 (six) hours as needed for cramping.  02/01/17   [name], MD  levothyroxine (SYNTHROID, LEVOTHROID) 25 MCG tablet Take 1 tablet (25 mcg total) daily before breakfast by mouth. 12/18/16   [name], MD  losartan (COZAAR) 100 MG tablet Take 1 tablet (100 mg) by mouth once daily    [provider]  magnesium oxide (MAG-OX) 400 MG tablet Take 1 tablet (400 mg total) by mouth daily. 07/12/17   Dunn, Areta Haber, PA-C  metoprolol succinate (TOPROL-XL) 50 MG 24 hr tablet Take 1 tablet (50 mg total) by mouth daily. Take with or immediately following a meal. 07/01/17   Dunn, Areta Haber, PA-C  Multiple Vitamin (MULTIVITAMIN WITH MINERALS) TABS tablet Take 1 tablet by mouth daily. 04/14/17   Gouru, Illene Silver, MD  nitroGLYCERIN (NITROSTAT) 0.4 MG SL tablet Place 1 tablet (0.4 mg total) under the tongue every 5 (five) minutes as needed for chest pain. 06/02/17    Minna Merritts, MD  pantoprazole (PROTONIX) 40 MG tablet Take 1 tablet (40 mg total) by mouth 2 (two) times daily. 05/11/16   [name], MD  pioglitazone (ACTOS) 15 MG tablet Take by mouth. 08/03/17 08/03/18  [provider]  predniSONE (DELTASONE) 10 MG tablet Taper daily as follows: 6 pills, 5, 4, 3, 2, 1 09/07/17   Carmon Ginsberg, PA  promethazine (PHENERGAN) 25 MG tablet Take 25 mg by mouth every 6 (six) hours as needed for nausea or vomiting.     [provider]  pyridostigmine (MESTINON) 60 MG tablet Take by mouth. 07/26/17  [provider]  rosuvastatin (CRESTOR) 10 MG tablet Take 1 tablet (10 mg total) by mouth daily. 08/10/17   Minna Merritts, MD    Allergies Lipitor [atorvastatin] and Tramadol  Family History  Problem Relation Age of Onset  . Hypertension Mother   . Anxiety disorder Mother   . Depression Mother   . Bipolar disorder Mother   . Heart disease Mother   . Hyperlipidemia Mother   . Kidney disease Father   . Heart disease Father   . Hypertension Father   . Diabetes Father   . Stroke Father   . Colon cancer Father        dx in his 2's  . Anxiety disorder Father   . Depression Father   . Skin cancer Father   . Kidney disease Sister   . Thyroid nodules Sister   . Hypertension Sister   . Hypertension Sister   . Diabetes Sister   . Hyperlipidemia Sister   . Depression Sister   . Breast cancer Maternal Aunt 86  . Breast cancer Maternal Aunt 68  . Ovarian cancer Cousin   . Colon cancer Cousin   . Breast cancer Other   . Kidney cancer Neg Hx   . Bladder Cancer Neg Hx     Social History Social History   Tobacco Use  . Smoking status: Former Smoker    Types: Cigarettes    Last attempt to quit: 08/31/1994    Years since quitting: 23.0  . Smokeless tobacco: Never Used  . Tobacco comment: quit 28 years ago  Substance Use Topics  . Alcohol use: No    Alcohol/week: 0.0 standard drinks  . Drug use: No    Review of  Systems Constitutional: No fever/chills Eyes: No visual changes. ENT: No sore throat. Cardiovascular: Chest pain and pressure as described above Respiratory: Dyspnea on exertion as described Gastrointestinal: No abdominal pain.  No nausea, no vomiting.  No diarrhea.  No constipation. Genitourinary: Negative for dysuria. Musculoskeletal: Negative for neck pain.  Negative for back pain. Integumentary: Negative for rash. Neurological: Negative for headaches, focal weakness or numbness.   ____________________________________________   PHYSICAL EXAM:  VITAL SIGNS: ED Triage Vitals  Enc Vitals Group     BP 09/19/17 1420 (!) 150/92     Pulse Rate 09/19/17 1420 (!) 107     Resp 09/19/17 1420 14     Temp 09/19/17 1420 98.7 F (37.1 C)     Temp Source 09/19/17 1420 Oral     SpO2 09/19/17 1420 98 %     Weight 09/19/17 1418 77.1 kg (170 lb)     Height 09/19/17 1418 1.575 m (_0 )     Head Circumference --      Peak Flow --      Pain Score 09/19/17 1418 9     Pain Loc --      Pain Edu? --      Excl. in [place]? --     Constitutional: Alert and oriented.  Appears mildly uncomfortable but reports that she is not in pain at the moment.   Eyes: Conjunctivae are normal.  Head: Atraumatic. Nose: No congestion/rhinnorhea. Mouth/Throat: Mucous membranes are moist. Neck: No stridor.  No meningeal signs.   Cardiovascular: Normal rate, regular rhythm. Good peripheral circulation. Grossly normal heart sounds. Respiratory: Normal respiratory effort.  No retractions. Lungs CTAB. Gastrointestinal: Soft and nontender. No distention.  Musculoskeletal: No lower extremity tenderness nor edema. No gross deformities of extremities. Neurologic:  Normal speech  and language. No gross focal neurologic deficits are appreciated.  Skin:  Skin is warm, dry and intact. No rash noted. Psychiatric: Mood and affect are normal. Speech and behavior are normal.  ____________________________________________    LABS (all labs ordered are listed, but only abnormal results are displayed)  Labs Reviewed  BASIC METABOLIC PANEL - Abnormal; Notable for the following components:      Result Value   Potassium 3.0 (*)    Chloride 116 (*)    CO2 19 (*)    Glucose, Bld 236 (*)    Creatinine, Ser 1.56 (*)    Calcium 8.2 (*)    GFR calc non Af Amer 38 (*)    GFR calc Af Amer 44 (*)    All other components within normal limits  CBC - Abnormal; Notable for the following components:   WBC 12.5 (*)    RDW 14.7 (*)    All other components within normal limits  TROPONIN I  HEPATIC FUNCTION PANEL  LIPASE, BLOOD  APTT  PROTIME-INR  TROPONIN I  TROPONIN I  POC URINE PREG, ED   ____________________________________________  EKG  ED ECG REPORT I, Hinda Kehr, the attending physician, personally viewed and interpreted this ECG.  Date: 09/19/2017 EKG Time: 14:21 Rate: 108 Rhythm: mild sinus tachycardia QRS Axis: normal Intervals: normal ST/T Wave abnormalities: ST depression in lateral leads with T wave inversion as well as inverted and notched T waves in leads I and aVL concerning for acute ischemia.  There is also some mild ST elevation in aVR but only in aVR.  The EKG is significantly changed from the last EKG on record from a month ago Narrative Interpretation: Concerning for acute ischemia but does not meet STEMI criteria   ____________________________________________  RADIOLOGY I, Hinda Kehr, personally viewed and evaluated these images (plain radiographs) as part of my medical decision making, as well as reviewing the written report by the radiologist.  ED MD interpretation: No acute intrathoracic abnormalities on chest x-ray  Official radiology report(s): No results found.  ____________________________________________   PROCEDURES  Critical Care performed: Yes, see critical care procedure note(s)   Procedure(s) performed:   .Critical Care Performed by: Hinda Kehr,  MD Authorized by: Hinda Kehr, MD   Critical care provider statement:    Critical care time (minutes):  30   Critical care time was exclusive of:  Separately billable procedures and treating other patients   Critical care was necessary to treat or prevent imminent or life-threatening deterioration of the following conditions: unstable angina with heparin bolus and infusion.   Critical care was time spent personally by me on the following activities:  Development of treatment plan with patient or surrogate, discussions with consultants, evaluation of patient's response to treatment, examination of patient, obtaining history from patient or surrogate, ordering and performing treatments and interventions, ordering and review of laboratory studies, ordering and review of radiographic studies, pulse oximetry, re-evaluation of patient's condition and review of old charts     ____________________________________________   INITIAL IMPRESSION / ASSESSMENT AND PLAN / ED COURSE  As part of my medical decision making, I reviewed the following data within the [facility] notes reviewed and incorporated, Labs reviewed , EKG interpreted , Old EKG reviewed, Old chart reviewed, Radiograph reviewed , Discussed with admitting physician , Discussed with cardiologist by phone (Dr. Percival Spanish) and reviewed Notes from prior ED visits    Differential diagnosis includes, but is not limited to, ACS/unstable angina, pneumonia, PE, pneumothorax.  I reviewed her medical history and electronic chart extensively including the results of her catheterization on 06/29/2017, which are summarized below:   Prox Cx lesion is 40% stenosed.  Previously placed 1st Diag stent (unknown type) is widely patent.  Mid LAD lesion is 5% stenosed.  Ost 1st Diag lesion is 40% stenosed.  Ost 2nd Diag to 2nd Diag lesion is 95% stenosed.  Dist LAD lesion is 70% stenosed.  Post intervention, there is a 30%  residual stenosis.  Balloon angioplasty was performed using a BALLOON TREK RX 2.5X12.  Given that she has significant persistent coronary artery disease in multiple vessels with stenosis, EKG changes from prior, and a history strongly suggestive of unstable angina, I feel that she needs aggressive treatment.  Fortunately her troponin is negative but she has ischemic changes on her EKG.  Her basic metabolic panel is notable for a slightly low potassium 3.0 which I will replete with oral potassium.  Her creatinine is 1.56 which is actually better than it has been in the past and she has known chronic kidney disease stage III.  She has a very mild leukocytosis of unknown significance.   I have ordered coagulation studies and hepatic function panel and lipase for the sake of completeness but I strongly doubt that any of these will be significantly abnormal.  I am treating aggressively for unstable angina with a heparin bolus of 4000 units IV and heparin infusion according to the pharmacy protocol.  I have given her another 243 mg of aspirin by mouth to bring her to 1 full dose aspirin for the day and she Artie had her 75 mg of Plavix earlier today.  I will call discussed the case with [facility] cardiology to see if they have any additional recommendations and then will speak with the hospitalist.   Clinical Course as of Sep 19 1621  Sun Sep 19, 2017  1559 I spoke with phone with Dr. Percival Spanish of [facility] cardiology.  He agreed with my management thus far including the heparin infusion.  He recommended against Brilinta or any additional doses of Plavix.  He encouraged hydration and I explained my plan of 500 mL of normal saline bolus followed by an infusion while she is in the hospital and he agreed with this.  He asked [place] to let hospitalist know to make her n.p.o. after midnight for probable cath in the morning.  I will update the patient with the plan and speak with  hospitalist.   [CF]  [phone] Patient understands and agrees with the plan and is in no pain at this time.  I spoke by phone with Dr. Leslye Peer of the hospitalist service who agrees with the plan including plan for hydration and n.p.o. status after midnight.   [CF]    Clinical Course User Index [CF] Hinda Kehr, MD    ____________________________________________  FINAL CLINICAL IMPRESSION(S) / ED DIAGNOSES  Final diagnoses:  Unstable angina ([place])     MEDICATIONS GIVEN DURING THIS VISIT:  Medications  heparin injection 4,000 Units (has no administration in time range)  sodium chloride 0.9 % bolus 500 mL (500 mLs Intravenous New Bag/Given 09/19/17 1612)  aspirin chewable tablet 243 mg (243 mg Oral Given 09/19/17 1610)     ED Discharge Orders    None       Note:  This document was prepared using Dragon voice recognition software and may include unintentional dictation errors.    Hinda Kehr, MD 09/19/17 [phone]

## 2017-09-19 NOTE — Telephone Encounter (Signed)
Pt called complaining of chest pain which she has had off and on x 2 weeks. NTG helps. I suggested she go to the closest ED but she would prefer to go the office. She knows she can go to [facility]- Dr [name] and Thurmond Butts know her. I'll send a message to try and get her seen Monday.  Kerin Ransom PA-C 09/19/2017 12:55 PM

## 2017-09-19 NOTE — H&P (Signed)
[facility] [place] NAME: [name]    MR#:  [id number]  DATE OF BIRTH:  05-Jun-1970  DATE OF ADMISSION:  09/19/2017  PRIMARY CARE PHYSICIAN: Dr. Caryn Section  REQUESTING/REFERRING PHYSICIAN: Dr. Karma Greaser  CHIEF COMPLAINT:   Chief Complaint  Patient presents with  . Chest Pain    HISTORY OF PRESENT ILLNESS:  [name] [name]  is a 47 y.o. female with a known history of CAD presents with chest pain going on and off for a week.  She states that the chest pain feels like an 11 out of 10 in intensity dull pain in the center of her chest.  Also worse with a deep breath and with palpation.  She took 5 nitroglycerin today.  Is been on and off for the past week but more constant over the last few days.  Of note, she is recently been on prednisone and is been off prednisone for the past week and that is when the pain started.  Patient also having diarrhea 4-6 times per day.  No blood in the bowel movements no black tar-like stools.  PAST MEDICAL HISTORY:   Past Medical History:  Diagnosis Date  . Anemia    iron deficiency anemia  . Aortic arch aneurysm ([place])   . Bipolar disorder ([place])   . BRCA negative 2014  . CKD (chronic kidney disease)   . Colon polyp   . CVA (cerebral vascular accident) ([place])    Left side weakness.   . Diabetes ([place])   . Family history of breast cancer    BRCA neg 2014  . Gastric ulcer 04/27/2011  . HTN (hypertension)   . Hyperlipemia   . Hypothyroid   . Malignant melanoma of skin of scalp ([place])   . MI, acute, non ST segment elevation ([place])   . Neuromuscular disorder ([place])   . S/P drug eluting coronary stent placement 06/04/2015  . Sepsis ([place]) 03/18/2017    PAST SURGICAL HISTORY:   Past Surgical History:  Procedure Laterality Date  . APPENDECTOMY    . CARDIAC CATHETERIZATION N/A 11/09/2014   Procedure: Coronary Angiography;  Surgeon: Minna Merritts, MD;  Location: [facility];  Service:  Cardiovascular;  Laterality: N/A;  . CARDIAC CATHETERIZATION N/A 11/12/2014   Procedure: Coronary Stent Intervention;  Surgeon: Isaias Cowman, MD;  Location: [facility];  Service: Cardiovascular;  Laterality: N/A;  . CARDIAC CATHETERIZATION N/A 04/18/2015   Procedure: Left Heart Cath and Coronary Angiography;  Surgeon: Minna Merritts, MD;  Location: [facility];  Service: Cardiovascular;  Laterality: N/A;  . CARDIAC CATHETERIZATION Left 06/04/2015   Procedure: Left Heart Cath and Coronary Angiography;  Surgeon: Wellington Hampshire, MD;  Location: [facility];  Service: Cardiovascular;  Laterality: Left;  . CARDIAC CATHETERIZATION N/A 06/04/2015   Procedure: Coronary Stent Intervention;  Surgeon: Wellington Hampshire, MD;  Location: [facility];  Service: Cardiovascular;  Laterality: N/A;  . CESAREAN SECTION  2001  . CHOLECYSTECTOMY N/A 11/18/2016   Procedure: LAPAROSCOPIC CHOLECYSTECTOMY WITH INTRAOPERATIVE CHOLANGIOGRAM;  Surgeon: Christene Lye, MD;  Location: [facility];  Service: General;  Laterality: N/A;  . COLONOSCOPY WITH PROPOFOL N/A 04/27/2016   Procedure: COLONOSCOPY WITH PROPOFOL;  Surgeon: [name], MD;  Location: [facility];  Service: Endoscopy;  Laterality: N/A;  . CORONARY ANGIOPLASTY    . CORONARY BALLOON ANGIOPLASTY N/A 06/29/2017   Procedure: CORONARY BALLOON ANGIOPLASTY;  Surgeon: Wellington Hampshire,  MD;  Location: [facility];  Service: Cardiovascular;  Laterality: N/A;  . DILATION AND CURETTAGE OF UTERUS    . ESOPHAGOGASTRODUODENOSCOPY (EGD) WITH PROPOFOL N/A 09/14/2014   Procedure: ESOPHAGOGASTRODUODENOSCOPY (EGD) WITH PROPOFOL;  Surgeon: Josefine Class, MD;  Location: [facility];  Service: Endoscopy;  Laterality: N/A;  . ESOPHAGOGASTRODUODENOSCOPY (EGD) WITH PROPOFOL N/A 04/27/2016   Procedure: ESOPHAGOGASTRODUODENOSCOPY (EGD) WITH PROPOFOL;  Surgeon: [name], MD;  Location: [facility];  Service:  Endoscopy;  Laterality: N/A;  Diabetic - oral meds  . GASTRIC BYPASS  09/2009   [name]   . Left Carotid to sublcavian artery bypass w/ subclavian artery ligation     a. Performed @ [facility].  . LEFT HEART CATH AND CORONARY ANGIOGRAPHY Left 06/29/2017   Procedure: LEFT HEART CATH AND CORONARY ANGIOGRAPHY;  Surgeon: Wellington Hampshire, MD;  Location: [facility];  Service: Cardiovascular;  Laterality: Left;  Marland Kitchen MELANOMA EXCISION  2016   Dr. Evorn Gong  . [place]  2002  . RIGHT OOPHORECTOMY    . SHOULDER ARTHROSCOPY WITH OPEN ROTATOR CUFF REPAIR Right 01/07/2016   Procedure: SHOULDER ARTHROSCOPY WITH DEBRIDMENT, SUBACHROMIAL DECOMPRESSION;  Surgeon: Corky Mull, MD;  Location: [facility];  Service: Orthopedics;  Laterality: Right;  . SHOULDER ARTHROSCOPY WITH OPEN ROTATOR CUFF REPAIR Right 03/16/2017   Procedure: SHOULDER ARTHROSCOPY WITH OPEN ROTATOR CUFF REPAIR POSSIBLE BICEPS TENODESIS;  Surgeon: Corky Mull, MD;  Location: [facility];  Service: Orthopedics;  Laterality: Right;  . TRIGGER FINGER RELEASE Right     [place]    SOCIAL HISTORY:   Social History   Tobacco Use  . Smoking status: Former Smoker    Types: Cigarettes    Last attempt to quit: 08/31/1994    Years since quitting: 23.0  . Smokeless tobacco: Never Used  . Tobacco comment: quit 28 years ago  Substance Use Topics  . Alcohol use: No    Alcohol/week: 0.0 standard drinks    FAMILY HISTORY:   Family History  Problem Relation Age of Onset  . Hypertension Mother   . Anxiety disorder Mother   . Depression Mother   . Bipolar disorder Mother   . Heart disease Mother        No details  . Hyperlipidemia Mother   . Kidney disease Father   . Heart disease Father 52  . Hypertension Father   . Diabetes Father   . Stroke Father   . Colon cancer Father        dx in his 7's  . Anxiety disorder Father   . Depression Father   . Skin cancer Father   . Kidney disease Sister   .  Thyroid nodules Sister   . Hypertension Sister   . Hypertension Sister   . Diabetes Sister   . Hyperlipidemia Sister   . Depression Sister   . Breast cancer Maternal Aunt 43  . Breast cancer Maternal Aunt 44  . Ovarian cancer Cousin   . Colon cancer Cousin   . Breast cancer Other   . Kidney cancer Neg Hx   . Bladder Cancer Neg Hx     DRUG ALLERGIES:   Allergies  Allergen Reactions  . Lipitor [Atorvastatin] Other (See Comments)    Leg pains  . Tramadol Other (See Comments)    Mouth feels like it's on fire    REVIEW OF SYSTEMS:  CONSTITUTIONAL: No fever, or chills.  Positive for sweating and feeling hot.  Positive for weight  gain.  Positive for fatigue.  EYES: No blurred or double vision.  EARS, NOSE, AND THROAT: No tinnitus or ear pain. No sore throat.  Decreased hearing.  Positive runny nose and postnasal drip. RESPIRATORY: Positive for occasional cough.  Positive for shortness of breath.  No wheezing or hemoptysis.  CARDIOVASCULAR: Positive for chest pain.  No orthopnea, edema.  GASTROINTESTINAL: No nausea, vomiting,  or abdominal pain. No blood in bowel movements.  Positive for diarrhea 4-6 episodes a day GENITOURINARY: No dysuria, hematuria.  ENDOCRINE: No polyuria, nocturia.  Positive for hypothyroidism HEMATOLOGY: No anemia, easy bruising or bleeding SKIN: No rash or lesion. MUSCULOSKELETAL: No joint pain or arthritis.   NEUROLOGIC: No tingling, numbness, weakness.  PSYCHIATRY: History of bipolar with anxiety depression  MEDICATIONS AT HOME:   Prior to Admission medications   Medication Sig Start Date End Date Taking? Authorizing Provider  ALPRAZolam Duanne Moron) 1 MG tablet Take 1 tablet (1 mg total) by mouth daily as needed for anxiety. 08/18/17   [name], MD  aspirin 81 MG chewable tablet Chew 1 tablet (81 mg total) by mouth daily. 07/01/17   Dunn, Areta Haber, PA-C  Blood Glucose Monitoring Suppl (ONE TOUCH ULTRA 2) w/Device KIT Use to check blood sugar once a day.  Dx. E11.9 03/18/17   [name], MD  buPROPion (WELLBUTRIN XL) 150 MG 24 hr tablet TAKE 1 TABLET BY MOUTH DAILY 08/10/17   [name], MD  Calcium Carbonate-Vitamin D (CALCIUM 500/D PO) Take 500 mg by mouth.    [provider]  celecoxib (CELEBREX) 200 MG capsule Take 200 mg by mouth 2 (two) times daily as needed for moderate pain.     [provider]  citalopram (CELEXA) 40 MG tablet Take 1 tablet (40 mg total) by mouth daily. 06/08/17   [name], MD  clopidogrel (PLAVIX) 75 MG tablet Take 1 tablet (75 mg total) by mouth daily with breakfast. 07/01/17   Dunn, Areta Haber, PA-C  Cyanocobalamin (VITAMIN B 12 PO) Take 1,000 mcg by mouth.    [provider]  diltiazem (CARDIZEM CD) 120 MG 24 hr capsule Take 1 capsule (120 mg total) by mouth daily. 07/09/17 10/07/17  Rise Mu, PA-C  gabapentin (NEURONTIN) 300 MG capsule Take 1 capsule (300 mg total) by mouth 2 (two) times daily. 06/04/17   [name], MD  hydrALAZINE (APRESOLINE) 50 MG tablet Take 1 tablet (50 mg total) by mouth 3 (three) times daily. 06/18/17 09/16/17  Rise Mu, PA-C  hyoscyamine (LEVSIN SL) 0.125 MG SL tablet Place 1 tablet (0.125 mg total) under the tongue every 6 (six) hours as needed. Patient taking differently: Place 0.125 mg under the tongue every 6 (six) hours as needed for cramping.  02/01/17   [name], MD  levothyroxine (SYNTHROID, LEVOTHROID) 25 MCG tablet Take 1 tablet (25 mcg total) daily before breakfast by mouth. 12/18/16   [name], MD  losartan (COZAAR) 100 MG tablet Take 1 tablet (100 mg) by mouth once daily    [provider]  magnesium oxide (MAG-OX) 400 MG tablet Take 1 tablet (400 mg total) by mouth daily. 07/12/17   Dunn, Areta Haber, PA-C  metoprolol succinate (TOPROL-XL) 50 MG 24 hr tablet Take 1 tablet (50 mg total) by mouth daily. Take with or immediately following a meal. 07/01/17   Dunn, Areta Haber, PA-C  Multiple Vitamin (MULTIVITAMIN WITH MINERALS)  TABS tablet Take 1 tablet by mouth daily. 04/14/17   Gouru,  Illene Silver, MD  nitroGLYCERIN (NITROSTAT) 0.4 MG SL tablet Place 1 tablet (0.4 mg total) under the tongue every 5 (five) minutes as needed for chest pain. 06/02/17   Minna Merritts, MD  pantoprazole (PROTONIX) 40 MG tablet Take 1 tablet (40 mg total) by mouth 2 (two) times daily. 05/11/16   [name], MD  pioglitazone (ACTOS) 15 MG tablet Take by mouth. 08/03/17 08/03/18  [provider]  promethazine (PHENERGAN) 25 MG tablet Take 25 mg by mouth every 6 (six) hours as needed for nausea or vomiting.     [provider]  pyridostigmine (MESTINON) 60 MG tablet Take by mouth. 07/26/17   [provider]  rosuvastatin (CRESTOR) 10 MG tablet Take 1 tablet (10 mg total) by mouth daily. 08/10/17   Minna Merritts, MD      VITAL SIGNS:  Blood pressure (!) 175/86, pulse 72, temperature 98.7 F (37.1 C), temperature source Oral, resp. rate 15, height '5\' 2"'  (1.575 m), weight 77.1 kg, SpO2 98 %.  PHYSICAL EXAMINATION:  GENERAL:  47 y.o.-year-old patient lying in the bed with no acute distress.  EYES: Pupils equal, round, reactive to light and accommodation. No scleral icterus. Extraocular muscles intact.  HEENT: Head atraumatic, normocephalic. Oropharynx and nasopharynx clear.  NECK:  Supple, no jugular venous distention. No thyroid enlargement, no tenderness.  LUNGS: Normal breath sounds bilaterally, no wheezing, rales,rhonchi or crepitation. No use of accessory muscles of respiration.  CARDIOVASCULAR: S1, S2 normal. No murmurs, rubs, or gallops.  ABDOMEN: Soft, nontender, nondistended. Bowel sounds present. No organomegaly or mass.  EXTREMITIES: Trace pedal edema.  No cyanosis, or clubbing.  NEUROLOGIC: Cranial nerves II through XII are intact. Muscle strength 5/5 in all extremities. Sensation intact. Gait not checked.  PSYCHIATRIC: The patient is alert and oriented x 3.  SKIN: No rash, lesion, or ulcer.   LABORATORY  PANEL:   CBC Recent Labs  Lab 09/19/17 1419  WBC 12.5*  HGB 12.6  HCT 37.8  PLT 177   ------------------------------------------------------------------------------------------------------------------  Chemistries  Recent Labs  Lab 09/19/17 1419 09/19/17 1558  NA 140  --   K 3.0*  --   CL 116*  --   CO2 19*  --   GLUCOSE 236*  --   BUN 20  --   CREATININE 1.56*  --   CALCIUM 8.2*  --   AST  --  48*  ALT  --  96*  ALKPHOS  --  127*  BILITOT  --  0.4   ------------------------------------------------------------------------------------------------------------------  Cardiac Enzymes Recent Labs  Lab 09/19/17 1419  TROPONINI <0.03   ------------------------------------------------------------------------------------------------------------------  RADIOLOGY:  Dg Chest Portable 1 View  Result Date: 09/19/2017 CLINICAL DATA:  Acute chest pain EXAM: PORTABLE CHEST 1 VIEW COMPARISON:  03/18/2017 and prior exams FINDINGS: Cardiomediastinal silhouette is unchanged. Surgical clips overlying the UPPER LEFT chest/LOWER neck noted. Mild elevation of the RIGHT hemidiaphragm again noted. There is no evidence of focal airspace disease, pulmonary edema, suspicious pulmonary nodule/mass, pleural effusion, or pneumothorax. No acute bony abnormalities are identified. IMPRESSION: No active disease. Electronically Signed   By: Margarette Canada M.D.   On: 09/19/2017 16:36    EKG:   Sinus tachycardia 108 bpm, LVH, nonspecific ST-T wave changes  IMPRESSION AND PLAN:   1.  Chest pain concerning for unstable angina, history of CAD.  Aspirin, heparin drip, Plavix, Toprol-XL, Crestor prescribed.  Check lipid profile in the a.m.  N.p.o. after midnight for cardiac catheterization tomorrow morning.  IV fluids overnight.  I offered the patient Solu-Medrol or prednisone because the pain is also reproducible and since the patient was on prednisone and then once they stopped the prednisone started  developing the pain it could be inflammatory in nature.  The patient did not want to do any prednisone at this point. 2.  Chronic kidney disease stage III.  Monitor daily after cardiac catheterization.  IV fluids overnight hold losartan. 3.  Diarrhea.  Send off stool studies. 4.  Hypokalemia replace potassium orally 5.  Type 2 diabetes mellitus hold Actos and put on sliding scale 6.  History of stroke on aspirin and Plavix 7.  History of melanoma 8.  Hypothyroidism unspecified on levothyroxine 9.  Obesity.  Patient status post gastric bypass 10.  History of bipolar disorder on Wellbutrin and Celexa    All the records are reviewed and case discussed with ED provider. Management plans discussed with the patient, family and they are in agreement.  CODE STATUS: Full code  TOTAL TIME TAKING CARE OF THIS PATIENT: 55 minutes, including ACP time   Loletha Grayer M.D on 09/19/2017 at 5:01 PM  Between 7am to 6pm - Pager - [phone]  After 6pm call admission pager [phone]  [facility] Office  [phone]  CC: Primary care physician; Dr. Caryn Section

## 2017-09-19 NOTE — ED Notes (Signed)
ED urine pregnancy negative

## 2017-09-20 ENCOUNTER — Encounter: Payer: Self-pay | Admitting: *Deleted

## 2017-09-20 ENCOUNTER — Encounter
Admission: EM | Disposition: A | Discharge status: Home / Self Care | Payer: Self-pay | Source: Home / Self Care | Attending: Internal Medicine

## 2017-09-20 DIAGNOSIS — I25110 Atherosclerotic heart disease of native coronary artery with unstable angina pectoris: Principal | ICD-10-CM

## 2017-09-20 DIAGNOSIS — E876 Hypokalemia: Secondary | ICD-10-CM

## 2017-09-20 DIAGNOSIS — I200 Unstable angina: Secondary | ICD-10-CM

## 2017-09-20 DIAGNOSIS — R001 Bradycardia, unspecified: Secondary | ICD-10-CM

## 2017-09-20 HISTORY — PX: LEFT HEART CATH AND CORONARY ANGIOGRAPHY: CATH118249

## 2017-09-20 HISTORY — PX: CORONARY BALLOON ANGIOPLASTY: CATH118233

## 2017-09-20 LAB — GLUCOSE, CAPILLARY
GLUCOSE-CAPILLARY: 101 mg/dL — AB (ref 70–99)
Glucose-Capillary: 107 mg/dL — ABNORMAL HIGH (ref 70–99)
Glucose-Capillary: 141 mg/dL — ABNORMAL HIGH (ref 70–99)
Glucose-Capillary: 231 mg/dL — ABNORMAL HIGH (ref 70–99)
Glucose-Capillary: 122 mg/dL — ABNORMAL HIGH (ref 70–99)

## 2017-09-20 LAB — CBC
HCT: 32.4 % — ABNORMAL LOW (ref 35.0–47.0)
HEMOGLOBIN: 11.1 g/dL — AB (ref 12.0–16.0)
MCH: 27.5 pg (ref 26.0–34.0)
MCHC: 34.1 g/dL (ref 32.0–36.0)
MCV: 80.6 fL (ref 80.0–100.0)
Platelets: 138 10*3/uL — ABNORMAL LOW (ref 150–440)
RBC: 4.02 MIL/uL (ref 3.80–5.20)
RDW: 14.4 % (ref 11.5–14.5)
WBC: 9.4 10*3/uL (ref 3.6–11.0)

## 2017-09-20 LAB — BASIC METABOLIC PANEL
ANION GAP: 5 (ref 5–15)
BUN: 17 mg/dL (ref 6–20)
CALCIUM: 8 mg/dL — AB (ref 8.9–10.3)
CHLORIDE: 116 mmol/L — AB (ref 98–111)
CO2: 23 mmol/L (ref 22–32)
CREATININE: 1.47 mg/dL — AB (ref 0.44–1.00)
GFR calc non Af Amer: 41 mL/min — ABNORMAL LOW (ref >60)
GFR, EST AFRICAN AMERICAN: 48 mL/min — AB (ref >60)
Glucose, Bld: 106 mg/dL — ABNORMAL HIGH (ref 70–99)
Potassium: 3.2 mmol/L — ABNORMAL LOW (ref 3.5–5.1)
SODIUM: 144 mmol/L (ref 135–145)

## 2017-09-20 LAB — LIPID PANEL
CHOL/HDL RATIO: 2.5 ratio
CHOLESTEROL: 73 mg/dL (ref 0–200)
HDL: 29 mg/dL — ABNORMAL LOW (ref >40)
LDL Cholesterol: 35 mg/dL (ref 0–99)
Triglycerides: 45 mg/dL (ref <150)
VLDL: 9 mg/dL (ref 0–40)

## 2017-09-20 LAB — HEPARIN LEVEL (UNFRACTIONATED): Heparin Unfractionated: 0.29 IU/mL — ABNORMAL LOW (ref 0.30–0.70)

## 2017-09-20 LAB — POCT ACTIVATED CLOTTING TIME: ACTIVATED CLOTTING TIME: 241 s

## 2017-09-20 SURGERY — LEFT HEART CATH AND CORONARY ANGIOGRAPHY
Anesthesia: Moderate Sedation

## 2017-09-20 MED ORDER — OXYCODONE-ACETAMINOPHEN 5-325 MG PO TABS
1.0000 | ORAL_TABLET | Freq: Four times a day (QID) | ORAL | Status: DC | PRN
  Administered 2017-09-20: 1 via ORAL
  Filled 2017-09-20: qty 1

## 2017-09-20 MED ORDER — HEPARIN SODIUM (PORCINE) 1000 UNIT/ML IJ SOLN
INTRAMUSCULAR | Status: DC | PRN
  Administered 2017-09-20: 4000 [IU] via INTRAVENOUS
  Administered 2017-09-20: 5000 [IU] via INTRAVENOUS

## 2017-09-20 MED ORDER — VERAPAMIL HCL 2.5 MG/ML IV SOLN
INTRAVENOUS | Status: AC
Start: 2017-09-20 — End: ?
  Filled 2017-09-20: qty 2

## 2017-09-20 MED ORDER — AMLODIPINE BESYLATE 5 MG PO TABS
5.0000 mg | ORAL_TABLET | Freq: Every day | ORAL | Status: DC
  Administered 2017-09-20 – 2017-09-21 (×2): 5 mg via ORAL
  Filled 2017-09-20 (×2): qty 1

## 2017-09-20 MED ORDER — LIDOCAINE HCL (PF) 1 % IJ SOLN
INTRAMUSCULAR | Status: AC
  Filled 2017-09-20: qty 30

## 2017-09-20 MED ORDER — CLOPIDOGREL BISULFATE 75 MG PO TABS
ORAL_TABLET | ORAL | Status: DC | PRN
  Administered 2017-09-20: 150 mg via ORAL

## 2017-09-20 MED ORDER — LIDOCAINE HCL (PF) 1 % IJ SOLN
INTRAMUSCULAR | Status: DC | PRN
  Administered 2017-09-20: 2 mL

## 2017-09-20 MED ORDER — SODIUM CHLORIDE 0.9% FLUSH: 3.0000 mL | INTRAVENOUS | Status: DC | PRN

## 2017-09-20 MED ORDER — NITROGLYCERIN 5 MG/ML IV SOLN
INTRAVENOUS | Status: AC
Start: 2017-09-20 — End: ?
  Filled 2017-09-20: qty 10

## 2017-09-20 MED ORDER — SODIUM CHLORIDE 0.9 % WEIGHT BASED INFUSION: 1.0000 mL/kg/h | INTRAVENOUS | Status: AC

## 2017-09-20 MED ORDER — MIDAZOLAM HCL 2 MG/2ML IJ SOLN
INTRAMUSCULAR | Status: DC | PRN
  Administered 2017-09-20 (×2): 1 mg via INTRAVENOUS

## 2017-09-20 MED ORDER — IOPAMIDOL (ISOVUE-300) INJECTION 61%
INTRAVENOUS | Status: DC | PRN
  Administered 2017-09-20: 75 mL via INTRA_ARTERIAL

## 2017-09-20 MED ORDER — SODIUM CHLORIDE 0.9 % IV SOLN: 250.0000 mL | INTRAVENOUS | Status: DC | PRN

## 2017-09-20 MED ORDER — MIDAZOLAM HCL 2 MG/2ML IJ SOLN
INTRAMUSCULAR | Status: AC
  Filled 2017-09-20: qty 2

## 2017-09-20 MED ORDER — FENTANYL CITRATE (PF) 100 MCG/2ML IJ SOLN
INTRAMUSCULAR | Status: AC
  Filled 2017-09-20: qty 2

## 2017-09-20 MED ORDER — SODIUM CHLORIDE 0.9% FLUSH
3.0000 mL | Freq: Two times a day (BID) | INTRAVENOUS | Status: DC
  Administered 2017-09-20: 3 mL via INTRAVENOUS

## 2017-09-20 MED ORDER — CLOPIDOGREL BISULFATE 75 MG PO TABS
ORAL_TABLET | ORAL | Status: AC
  Filled 2017-09-20: qty 2

## 2017-09-20 MED ORDER — SODIUM CHLORIDE 0.9 % IV SOLN
INTRAVENOUS | Status: DC
  Administered 2017-09-20: 10:00:00 via INTRAVENOUS

## 2017-09-20 MED ORDER — HEPARIN SODIUM (PORCINE) 1000 UNIT/ML IJ SOLN
INTRAMUSCULAR | Status: AC
Start: 2017-09-20 — End: ?
  Filled 2017-09-20: qty 1

## 2017-09-20 MED ORDER — NITROGLYCERIN 1 MG/10 ML FOR IR/CATH LAB
INTRA_ARTERIAL | Status: DC | PRN
  Administered 2017-09-20 (×2): 200 ug via INTRACORONARY

## 2017-09-20 MED ORDER — FENTANYL CITRATE (PF) 100 MCG/2ML IJ SOLN
INTRAMUSCULAR | Status: DC | PRN
  Administered 2017-09-20 (×2): 25 ug via INTRAVENOUS

## 2017-09-20 MED ORDER — SODIUM CHLORIDE 0.9% FLUSH: 3.0000 mL | Freq: Two times a day (BID) | INTRAVENOUS | Status: DC

## 2017-09-20 SURGICAL SUPPLY — 15 items
BALLN WOLVERINE 2.50X10 (BALLOONS) ×3
BALLOON WOLVERINE 2.50X10 (BALLOONS) IMPLANT
CATH INFINITI 5 FR JL3.5 (CATHETERS) ×2 IMPLANT
CATH INFINITI JR4 5F (CATHETERS) ×2 IMPLANT
CATH LAUNCHER 6FR EBU3.5 (CATHETERS) ×2 IMPLANT
DEVICE INFLAT 30 PLUS (MISCELLANEOUS) ×2 IMPLANT
DEVICE RAD TR BAND REGULAR (VASCULAR PRODUCTS) ×2 IMPLANT
KIT MANI 3VAL PERCEP (MISCELLANEOUS) ×3 IMPLANT
NDL PERC 21GX4CM (NEEDLE) IMPLANT
NEEDLE PERC 21GX4CM (NEEDLE) ×3 IMPLANT
PACK CARDIAC CATH (CUSTOM PROCEDURE TRAY) ×3 IMPLANT
SHEATH RAIN RADIAL 21G 6FR (SHEATH) ×2 IMPLANT
WIRE HITORQ VERSACORE ST 145CM (WIRE) ×2 IMPLANT
WIRE INTUITION PROPEL ST 180CM (WIRE) ×2 IMPLANT
WIRE ROSEN-J .035X260CM (WIRE) ×2 IMPLANT

## 2017-09-20 NOTE — Progress Notes (Signed)
No more complaints of chest pain throughout the evening. Will continue to monitor and assess.

## 2017-09-20 NOTE — Plan of Care (Signed)
  Problem: Education: Goal: Knowledge of General Education information will improve Description: Including pain rating scale, medication(s)/side effects and non-pharmacologic comfort measures Outcome: Progressing   Problem: Health Behavior/Discharge Planning: Goal: Ability to manage health-related needs will improve Outcome: Progressing   Problem: Activity: Goal: Risk for activity intolerance will decrease Outcome: Progressing   Problem: Nutrition: Goal: Adequate nutrition will be maintained Outcome: Progressing   Problem: Coping: Goal: Level of anxiety will decrease Outcome: Progressing   Problem: Pain Managment: Goal: General experience of comfort will improve Outcome: Progressing   Problem: Safety: Goal: Ability to remain free from injury will improve Outcome: Progressing   

## 2017-09-20 NOTE — Progress Notes (Addendum)
[facility] [place] NAME: Mitsuye Schrodt    MR#:  [id number]  DATE OF BIRTH:  1970/08/04  SUBJECTIVE: Patient admitted for chest pain status post cardiac cath this morning showing restenosis of previous angioplasty site and ostial second diagonal artery.  Patient had successful balloon angioplasty of ostial second diagonal.  CHIEF COMPLAINT:   Chief Complaint  Patient presents with  . Chest Pain   Chest pain-free now.Marland Kitchen   REVIEW OF SYSTEMS:   ROS CONSTITUTIONAL: No fever, fatigue or weakness.  EYES: No blurred or double vision.  EARS, NOSE, AND THROAT: No tinnitus or ear pain.  RESPIRATORY: No cough, shortness of breath, wheezing or hemoptysis.  CARDIOVASCULAR: No chest pain, orthopnea, edema.  GASTROINTESTINAL: No nausea, vomiting, diarrhea or abdominal pain.  GENITOURINARY: No dysuria, hematuria.  ENDOCRINE: No polyuria, nocturia,  HEMATOLOGY: No anemia, easy bruising or bleeding SKIN: No rash or lesion. MUSCULOSKELETAL: No joint pain or arthritis.   NEUROLOGIC: No tingling, numbness, weakness.  PSYCHIATRY: No anxiety or depression.   DRUG ALLERGIES:   Allergies  Allergen Reactions  . Lipitor [Atorvastatin] Other (See Comments)    Leg pains  . Tramadol Other (See Comments)    Mouth feels like it's on fire    VITALS:  Blood pressure (!) 141/61, pulse (!) 52, temperature 98 F (36.7 C), temperature source Oral, resp. rate (!) 21, height 5\' 2"  (1.575 m), weight 80.7 kg, SpO2 98 %.  PHYSICAL EXAMINATION:  GENERAL:  47 y.o.-year-old patient lying in the bed with no acute distress.  EYES: Pupils equal, round, reactive to light and accommodation. No scleral icterus. Extraocular muscles intact.  HEENT: Head atraumatic, normocephalic. Oropharynx and nasopharynx clear.  NECK:  Supple, no jugular venous distention. No thyroid enlargement, no tenderness.  LUNGS: Normal breath sounds bilaterally, no wheezing, rales,rhonchi or  crepitation. No use of accessory muscles of respiration.  CARDIOVASCULAR: S1, S2 normal. No murmurs, rubs, or gallops.  ABDOMEN: Soft, nontender, nondistended. Bowel sounds present. No organomegaly or mass.  EXTREMITIES: No pedal edema, cyanosis, or clubbing.  NEUROLOGIC: Cranial nerves II through XII are intact. Muscle strength 5/5 in all extremities. Sensation intact. Gait not checked.  PSYCHIATRIC: The patient is alert and oriented x 3.  SKIN: No obvious rash, lesion, or ulcer.    LABORATORY PANEL:   CBC Recent Labs  Lab 09/20/17 0513  WBC 9.4  HGB 11.1*  HCT 32.4*  PLT 138*   ------------------------------------------------------------------------------------------------------------------  Chemistries  Recent Labs  Lab 09/19/17 1558 09/20/17 0513  NA  --  144  K  --  3.2*  CL  --  116*  CO2  --  23  GLUCOSE  --  106*  BUN  --  17  CREATININE  --  1.47*  CALCIUM  --  8.0*  AST 48*  --   ALT 96*  --   ALKPHOS 127*  --   BILITOT 0.4  --    ------------------------------------------------------------------------------------------------------------------  Cardiac Enzymes Recent Labs  Lab 09/19/17 2240  TROPONINI <0.03   ------------------------------------------------------------------------------------------------------------------  RADIOLOGY:  Dg Chest Portable 1 View  Result Date: 09/19/2017 CLINICAL DATA:  Acute chest pain EXAM: PORTABLE CHEST 1 VIEW COMPARISON:  03/18/2017 and prior exams FINDINGS: Cardiomediastinal silhouette is unchanged. Surgical clips overlying the UPPER LEFT chest/LOWER neck noted. Mild elevation of the RIGHT hemidiaphragm again noted. There is no evidence of focal airspace disease, pulmonary edema, suspicious pulmonary nodule/mass, pleural effusion, or pneumothorax. No acute bony abnormalities are identified. IMPRESSION:  No active disease. Electronically Signed   By: Margarette Canada M.D.   On: 09/19/2017 16:36    EKG:   Orders placed or  performed during the hospital encounter of 09/19/17  . ED EKG within 10 minutes  . ED EKG within 10 minutes  . EKG 12-Lead  . EKG 12-Lead  . EKG 12-Lead immediately post procedure  . EKG 12-Lead  . EKG 12-Lead immediately post procedure  . EKG 12-Lead  . EKG 12-Lead    ASSESSMENT AND PLAN:  Left-sided chest pain.  With multiple dose of nitro concerning for unstable angina status post PCI before, balloon angioplasty of second diagonal in May of this year, because of her symptoms patient is taken to cardiac cath, patient had a cardiac cath this running which showed severe restenosis of previous angioplasty area of the second diagonal artery status post successful cutting balloon angioplasty of ostial second diagonal.  C continue aspirin, Plavix, high intensity statins, beta-blockers, nitrates.  Continue IV fluids monitor kidney function closely, likely discharge home tomorrow. 2. mild hypokalemia: Replace the potassium. #3 chronic kidney disease stage III: Monitor kidney function closely on IV fluids, patient received contrast for cardiac cath so continue IV fluids to prevent contrast-induced nephropathy.   4.patient had diarrhea when she came but no diarrhea today.  Check stool for cdiff  only if she has diarrhea otherwise discontinue isolation. All the records are reviewed and case discussed with Care Management/Social Workerr. Management plans discussed with the patient, family and they are in agreement.  CODE STATUS: full  TOTAL TIME TAKING CARE OF THIS PATIENT:35 minutes.   more than 50% time spent in counseling, coordination of care POSSIBLE D/C IN 1-2DAYS, DEPENDING ON CLINICAL CONDITION.   Epifanio Lesches M.D on 09/20/2017 at 1:19 PM  Between 7am to 6pm - Pager - [phone]  After 6pm go to [email or web address] - password [email or web address] [facility]  [facility]  Office  [phone]  CC: Primary care physician; No primary care provider on file.   Note: This dictation was  prepared with Dragon dictation along with smaller phrase technology. Any transcriptional errors that result from this process are unintentional.

## 2017-09-20 NOTE — Progress Notes (Signed)
ANTICOAGULATION CONSULT NOTE - Initial Consult  Pharmacy Consult for heparin Indication: chest pain/ACS  Allergies  Allergen Reactions  . Lipitor [Atorvastatin] Other (See Comments)    Leg pains  . Tramadol Other (See Comments)    Mouth feels like it's on fire    Patient Measurements: Height: 5' 2" (157.5 cm) Weight: 178 lb (80.7 kg) IBW/kg (Calculated) : 50.1 Heparin Dosing Weight: 67 kg  Vital Signs: Temp: 98.4 F (36.9 C) (08/19 0403) Temp Source: Oral (08/19 0403) BP: 145/70 (08/19 0403) Pulse Rate: 49 (08/19 0403)  Labs: Recent Labs    09/19/17 1419 09/19/17 1558 09/19/17 1912 09/19/17 2240  HGB 12.6  --   --   --   HCT 37.8  --   --   --   PLT 177  --   --   --   APTT  --  29  --   --   LABPROT  --  14.3  --   --   INR  --  1.12  --   --   HEPARINUNFRC  --   --   --  0.21*  CREATININE 1.56*  --   --   --   TROPONINI <0.03  --  <0.03 <0.03    Estimated Creatinine Clearance: 43.4 mL/min (A) (by C-G formula based on SCr of 1.56 mg/dL (H)).   Medical History: Past Medical History:  Diagnosis Date  . Anemia    iron deficiency anemia  . Aortic arch aneurysm (HCC)   . Bipolar disorder (HCC)   . BRCA negative 2014  . CKD (chronic kidney disease)   . Colon polyp   . CVA (cerebral vascular accident) ([facility])    Left side weakness.   . Diabetes (HCC)   . Family history of breast cancer    BRCA neg 2014  . Gastric ulcer 04/27/2011  . HTN (hypertension)   . Hyperlipemia   . Hypothyroid   . Malignant melanoma of skin of scalp (HCC)   . MI, acute, non ST segment elevation ([facility])   . Neuromuscular disorder ([facility])   . S/P drug eluting coronary stent placement 06/04/2015  . Sepsis ([facility]) 03/18/2017   Assessment: 46 year old female presenting with unstable angina. Pt reports no anticoagulation PTA, she takes Plavix  Goal of Therapy:  Heparin level 0.3-0.7 units/ml  Monitor platelets by anticoagulation protocol: Yes   Plan:  08/18 @ 2300 HL 0.21  subtherapeutic. Will rebolus w/ heparin 1000 units IV x 1 and increase rate to 950 units/hr. Per RN orders were not seen until 08/19 @ 0400 this am as a result will reschedule anti-Xa for 1000. CBC check w/ am labs.   , PharmD, BCPS Clinical Pharmacist 09/20/2017  

## 2017-09-20 NOTE — Progress Notes (Addendum)
MD Dr.Arida at bedside to assess right wrist site. Pt remains with some slight firmness and pain. Ok per MD to continue to deflate, MD ok with site at this time. Will continue to monitor closely.

## 2017-09-20 NOTE — Progress Notes (Signed)
Progress Note  Patient Name: [name] Date of Encounter: 09/20/2017  Primary Cardiologist: [name]  Subjective   Currently chest pain free. Had some mild episodes of chest pain overnight. Ruled out. Potassium low at 3.2. Renal function improved to 1.47.   Inpatient Medications    Scheduled Meds: . aspirin  81 mg Oral Daily  . buPROPion  150 mg Oral Daily  . calcium-vitamin D  1 tablet Oral BID  . citalopram  40 mg Oral Daily  . clopidogrel  75 mg Oral Q breakfast  . diltiazem  120 mg Oral Daily  . gabapentin  300 mg Oral BID  . hydrALAZINE  50 mg Oral TID  . insulin aspart  0-5 Units Subcutaneous QHS  . insulin aspart  0-9 Units Subcutaneous TID WC  . levothyroxine  25 mcg Oral QAC breakfast  . magnesium oxide  400 mg Oral Daily  . metoprolol succinate  50 mg Oral Daily  .  morphine injection  2 mg Intravenous Once  . multivitamin with minerals  1 tablet Oral Daily  . pantoprazole  40 mg Oral BID  . potassium chloride  40 mEq Oral Once  . pyridostigmine  60 mg Oral Q8H  . rosuvastatin  10 mg Oral q1800  . sodium chloride flush  3 mL Intravenous Q12H   Continuous Infusions: . sodium chloride    . sodium chloride 75 mL/hr at 09/19/17 2045  . sodium chloride    . heparin 950 Units/hr (09/20/17 0424)   PRN Meds: sodium chloride, acetaminophen **OR** acetaminophen, ALPRAZolam, hyoscyamine, morphine injection, nitroGLYCERIN, ondansetron **OR** ondansetron (ZOFRAN) IV, promethazine, sodium chloride flush   Vital Signs    Vitals:   09/19/17 1946 09/19/17 1950 09/19/17 2044 09/20/17 0403  BP: (!) 162/62 (!) 165/91 (!) 144/75 (!) 145/70  Pulse: (!) 119 (!) 117 80 (!) 49  Resp:    18  Temp:    98.4 F (36.9 C)  TempSrc:    Oral  SpO2: 100% 98% 100% 100%  Weight:    80.7 kg  Height:        Intake/Output Summary (Last 24 hours) at 09/20/2017 0733 Last data filed at 09/20/2017 0403 Gross per 24 hour  Intake 1032.75 ml  Output 850 ml  Net 182.75 ml    Filed Weights   09/19/17 1418 09/19/17 1808 09/20/17 0403  Weight: 77.1 kg 80.6 kg 80.7 kg    Telemetry    Sinus bradycardia to sinus rhythm with heart rates ranging from 40s to 80s bpm - Personally Reviewed  ECG    n/a - Personally Reviewed  Physical Exam   GEN: No acute distress.   Neck: No JVD. Cardiac: RRR, no murmurs, rubs, or gallops.  Respiratory: Clear to auscultation bilaterally.  GI: Soft, nontender, non-distended.   MS: No edema; No deformity. Neuro:  Alert and oriented x 3; Nonfocal.  Psych: Normal affect.  Labs    Chemistry Recent Labs  Lab 09/19/17 1419 09/19/17 1558 09/20/17 0513  NA 140  --  144  K 3.0*  --  3.2*  CL 116*  --  116*  CO2 19*  --  23  GLUCOSE 236*  --  106*  BUN 20  --  17  CREATININE 1.56*  --  1.47*  CALCIUM 8.2*  --  8.0*  PROT  --  6.0*  --   ALBUMIN  --  3.4*  --   AST  --  48*  --   ALT  --  96*  --   ALKPHOS  --  127*  --   BILITOT  --  0.4  --   GFRNONAA 38*  --  41*  GFRAA 44*  --  48*  ANIONGAP 5  --  5     Hematology Recent Labs  Lab 09/19/17 1419 09/20/17 0513  WBC 12.5* 9.4  RBC 4.65 4.02  HGB 12.6 11.1*  HCT 37.8 32.4*  MCV 81.3 80.6  MCH 27.2 27.5  MCHC 33.4 34.1  RDW 14.7* 14.4  PLT 177 138*    Cardiac Enzymes Recent Labs  Lab 09/19/17 1419 09/19/17 1912 09/19/17 2240  TROPONINI <0.03 <0.03 <0.03   No results for input(s): TROPIPOC in the last 168 hours.   BNPNo results for input(s): BNP, PROBNP in the last 168 hours.   DDimer No results for input(s): DDIMER in the last 168 hours.   Radiology    Dg Chest Portable 1 View  Result Date: 09/19/2017 IMPRESSION: No active disease. Electronically Signed   By: Margarette Canada M.D.   On: 09/19/2017 16:36    Cardiac Studies   LHC 06/29/2017: Conclusion     Prox Cx lesion is 40% stenosed.  Previously placed 1st Diag stent (unknown type) is widely patent.  Mid LAD lesion is 5% stenosed.  Ost 1st Diag lesion is 40% stenosed.  Ost  2nd Diag to 2nd Diag lesion is 95% stenosed.  Dist LAD lesion is 70% stenosed.  Post intervention, there is a 30% residual stenosis.  Balloon angioplasty was performed using a BALLOON TREK RX 2.5X12.   1.  Significant one-vessel coronary artery disease with patent stents in the mid LAD and first diagonal.  There is significant disease in the distal LAD close to the apex.  There is also severe ostial stenosis in the second diagonal which is new.  The  previous LAD stent is placed just distal to the origin of the second diagonal. 2.  Mildly elevated left ventricular end-diastolic pressure.  Left ventricular angiography was not performed due to chronic kidney disease. 3.  Successful balloon angioplasty of the ostial second diagonal without stent placement.  Not optimal location to place and stent in the future due to angulation from the LAD and the presence of a stent in the LAD in that area.  Recommendations: Dual antiplatelet therapy for at least 1 month. Aggressive treatment of risk factors. Hydrate overnight due to chronic kidney disease.  A total of 75 mL of contrast was used.   __________  Echo 03/19/2017: Study Conclusions  - Procedure narrative: Transthoracic echocardiography. Image   quality was suboptimal. The study was technically difficult. - Left ventricle: The cavity size was normal. Systolic function was   normal. The estimated ejection fraction was in the range of 60%   to 65%. Wall motion was normal; there were no regional wall   motion abnormalities. Left ventricular diastolic function   parameters were normal. - Left atrium: The atrium was normal in size. - Right ventricle: Systolic function was normal. - Pulmonary arteries: Systolic pressure was within the normal   range.   Patient Profile     47 y.o. female with history of CAD status post PCI to the LAD and D1 as well as PTCA of D2 in 08/6193, chronic systolic CHF secondary to ischemic cardiomyopathy,  hypertensive heart disease, CKD stage III-IV, hyperlipidemia, prior medication noncompliance, recurrent strokes and falls with concern for possible MS, obesity status post gastric bypass surgery, and aortic arch aneurysm who presented to [facility]  with chest pain.   Assessment & Plan    1. Unstable angina/CAD as above: -Currently chest pain free -Mixed typical and atypical features -Ruled out -Will discuss with MD regarding potential repeat LHC this morning  -NPO -DAPT with ASA and Plavix -Heparin gtt  2. CKD stage III: -Renal function is improving with gentle hydration  3. HLD: -LDL this morning 35 -Goal LDL < 70 -Continue Crestor 10 mg daily  4. HTN: -BP slightly elevated -Change Cardizem to Norvasc given bradycardia and mildly elevated BP -Continue Toprol XL 50 gm daily for now, may need to decrease to 25 mg daily -Continue hydralazine   5. Bradycardia: -Change Cardizem to Norvasc as above  -May need to decrease Toprol XL to 25 mg daily  6. Hypokalemia: -Replete to goal > 4.0 -She has 40 mEq ordered for 8/18, though it does not appear this was given -RN order placed to give this medication  For questions or updates, please contact [place] Please consult [email or web address] for contact info under Cardiology/STEMI.    Signed, Christell Faith, PA-C [place] Pager: [phone] 09/20/2017, 7:33 AM

## 2017-09-20 NOTE — Progress Notes (Signed)
Pt right wrist assessed at 1345, small amount of oozing found after removing 2 cc air. 2 cc air replaced. Right forearm with new firmness, site massaged with pressure and softened.  Pt complaining of pain in wrist, worse with touch and movement MD notified, currently in Cath Lab, will see Pt shortly.

## 2017-09-21 ENCOUNTER — Encounter: Payer: Self-pay | Admitting: Cardiovascular Disease

## 2017-09-21 DIAGNOSIS — E785 Hyperlipidemia, unspecified: Secondary | ICD-10-CM

## 2017-09-21 DIAGNOSIS — I10 Essential (primary) hypertension: Secondary | ICD-10-CM

## 2017-09-21 DIAGNOSIS — N183 Chronic kidney disease, stage 3 (moderate): Secondary | ICD-10-CM

## 2017-09-21 LAB — BASIC METABOLIC PANEL
Anion gap: 5 (ref 5–15)
BUN: 16 mg/dL (ref 6–20)
CO2: 22 mmol/L (ref 22–32)
CREATININE: 1.56 mg/dL — AB (ref 0.44–1.00)
Calcium: 8.2 mg/dL — ABNORMAL LOW (ref 8.9–10.3)
Chloride: 113 mmol/L — ABNORMAL HIGH (ref 98–111)
GFR calc Af Amer: 44 mL/min — ABNORMAL LOW (ref >60)
GFR, EST NON AFRICAN AMERICAN: 38 mL/min — AB (ref >60)
GLUCOSE: 113 mg/dL — AB (ref 70–99)
Potassium: 3.9 mmol/L (ref 3.5–5.1)
SODIUM: 140 mmol/L (ref 135–145)

## 2017-09-21 LAB — CBC
HEMATOCRIT: 33.1 % — AB (ref 35.0–47.0)
Hemoglobin: 11.1 g/dL — ABNORMAL LOW (ref 12.0–16.0)
MCH: 27.2 pg (ref 26.0–34.0)
MCHC: 33.7 g/dL (ref 32.0–36.0)
MCV: 80.9 fL (ref 80.0–100.0)
PLATELETS: 131 10*3/uL — AB (ref 150–440)
RBC: 4.09 MIL/uL (ref 3.80–5.20)
RDW: 14.8 % — AB (ref 11.5–14.5)
WBC: 11.7 10*3/uL — ABNORMAL HIGH (ref 3.6–11.0)

## 2017-09-21 LAB — GLUCOSE, CAPILLARY: Glucose-Capillary: 107 mg/dL — ABNORMAL HIGH (ref 70–99)

## 2017-09-21 MED ORDER — AMLODIPINE BESYLATE 5 MG PO TABS: 5.0000 mg | ORAL_TABLET | Freq: Every day | ORAL | 0 refills | Status: DC

## 2017-09-21 NOTE — Discharge Summary (Signed)
[name] [address] [name], is a 47 y.o. female  DOB [birth year]  MRN [id number].  Admission date:  09/19/2017  Admitting Physician  Loletha Grayer, MD  Discharge Date:  09/21/2017   Primary MD  No primary care provider on file.  Recommendations for primary care physician for things to follow:  Follow-up with [facility] health cardiology Dr. Fletcher Anon in 1 week Admission Diagnosis  Unstable angina ([place]) [I20.0]   Discharge Diagnosis  Unstable angina ([place]) [I20.0]   Active Problems:   Unstable angina [facility])      Past Medical History:  Diagnosis Date  . Anemia    iron deficiency anemia  . Aortic arch aneurysm ([place])   . Bipolar disorder ([place])   . BRCA negative 2014  . CKD (chronic kidney disease)   . Colon polyp   . CVA (cerebral vascular accident) ([place])    Left side weakness.   . Diabetes ([place])   . Family history of breast cancer    BRCA neg 2014  . Gastric ulcer 04/27/2011  . HTN (hypertension)   . Hyperlipemia   . Hypothyroid   . Malignant melanoma of skin of scalp ([place])   . MI, acute, non ST segment elevation ([place])   . Neuromuscular disorder ([place])   . S/P drug eluting coronary stent placement 06/04/2015  . Sepsis ([place]) 03/18/2017    Past Surgical History:  Procedure Laterality Date  . APPENDECTOMY    . CARDIAC CATHETERIZATION N/A 11/09/2014   Procedure: Coronary Angiography;  Surgeon: Minna Merritts, MD;  Location: [place] CV LAB;  Service: Cardiovascular;  Laterality: N/A;  . CARDIAC CATHETERIZATION N/A 11/12/2014   Procedure: Coronary Stent Intervention;  Surgeon: Isaias Cowman, MD;  Location: [place] CV LAB;  Service: Cardiovascular;  Laterality: N/A;  . CARDIAC CATHETERIZATION N/A 04/18/2015   Procedure: Left Heart Cath and Coronary Angiography;  Surgeon: Minna Merritts, MD;  Location: [place]  CV LAB;  Service: Cardiovascular;  Laterality: N/A;  . CARDIAC CATHETERIZATION Left 06/04/2015   Procedure: Left Heart Cath and Coronary Angiography;  Surgeon: Wellington Hampshire, MD;  Location: [place] CV LAB;  Service: Cardiovascular;  Laterality: Left;  . CARDIAC CATHETERIZATION N/A 06/04/2015   Procedure: Coronary Stent Intervention;  Surgeon: Wellington Hampshire, MD;  Location: [place] CV LAB;  Service: Cardiovascular;  Laterality: N/A;  . CESAREAN SECTION  2001  . CHOLECYSTECTOMY N/A 11/18/2016   Procedure: LAPAROSCOPIC CHOLECYSTECTOMY WITH INTRAOPERATIVE CHOLANGIOGRAM;  Surgeon: Christene Lye, MD;  Location: [facility] ORS;  Service: General;  Laterality: N/A;  . COLONOSCOPY WITH PROPOFOL N/A 04/27/2016   Procedure: COLONOSCOPY WITH PROPOFOL;  Surgeon: [name], MD;  Location: [place];  Service: Endoscopy;  Laterality: N/A;  . CORONARY ANGIOPLASTY    . CORONARY BALLOON ANGIOPLASTY N/A 06/29/2017   Procedure: CORONARY BALLOON ANGIOPLASTY;  Surgeon: Wellington Hampshire, MD;  Location: [place] CV LAB;  Service: Cardiovascular;  Laterality: N/A;  . CORONARY BALLOON ANGIOPLASTY N/A 09/20/2017   Procedure: CORONARY BALLOON ANGIOPLASTY;  Surgeon: Wellington Hampshire, MD;  Location: [place] CV LAB;  Service: Cardiovascular;  Laterality: N/A;  . DILATION AND CURETTAGE OF UTERUS    . ESOPHAGOGASTRODUODENOSCOPY (EGD) WITH PROPOFOL N/A 09/14/2014   Procedure: ESOPHAGOGASTRODUODENOSCOPY (EGD) WITH PROPOFOL;  Surgeon: Josefine Class, MD;  Location: [facility] ENDOSCOPY;  Service: Endoscopy;  Laterality: N/A;  . ESOPHAGOGASTRODUODENOSCOPY (EGD) WITH PROPOFOL N/A 04/27/2016   Procedure: ESOPHAGOGASTRODUODENOSCOPY (EGD) WITH PROPOFOL;  Surgeon: [name], MD;  Location: [place];  Service:  Endoscopy;  Laterality: N/A;  Diabetic - oral meds  . GASTRIC BYPASS  09/2009   [facility]   . Left Carotid to sublcavian artery bypass w/ subclavian artery ligation      a. Performed @ [place].  . LEFT HEART CATH AND CORONARY ANGIOGRAPHY Left 06/29/2017   Procedure: LEFT HEART CATH AND CORONARY ANGIOGRAPHY;  Surgeon: Wellington Hampshire, MD;  Location: [place] CV LAB;  Service: Cardiovascular;  Laterality: Left;  . LEFT HEART CATH AND CORONARY ANGIOGRAPHY N/A 09/20/2017   Procedure: LEFT HEART CATH AND CORONARY ANGIOGRAPHY;  Surgeon: Wellington Hampshire, MD;  Location: [place] CV LAB;  Service: Cardiovascular;  Laterality: N/A;  . MELANOMA EXCISION  2016   Dr. Evorn Gong  . [place]  2002  . RIGHT OOPHORECTOMY    . SHOULDER ARTHROSCOPY WITH OPEN ROTATOR CUFF REPAIR Right 01/07/2016   Procedure: SHOULDER ARTHROSCOPY WITH DEBRIDMENT, SUBACHROMIAL DECOMPRESSION;  Surgeon: Corky Mull, MD;  Location: [facility] ORS;  Service: Orthopedics;  Laterality: Right;  . SHOULDER ARTHROSCOPY WITH OPEN ROTATOR CUFF REPAIR Right 03/16/2017   Procedure: SHOULDER ARTHROSCOPY WITH OPEN ROTATOR CUFF REPAIR POSSIBLE BICEPS TENODESIS;  Surgeon: Corky Mull, MD;  Location: [facility] ORS;  Service: Orthopedics;  Laterality: Right;  . TRIGGER FINGER RELEASE Right     Middle Finger       History of present illness and  Hospital Course:     Kindly see H&P for history of present illness and admission details, please review complete Labs, Consult reports and Test reports for all details in brief  HPI  from the history and physical done on the day of admission 47 year old female patient with history of CAD, multiple clots, previous stent came in because of crushing left-sided chest pain 10 out of 10 in severity, patient took multiple dose of nitroglycerin at home and admitted to telemetry for chest pain, unstable angina.  And had previous balloon angioplasty at the second diagonal in May of this year.  And admitted this time because of severe crushing chest pain with nitroglycerin.   Hospital Course  Impression chest pain secondary to unstable angina: Patient is admitted to  telemetry, continued on aspirin, Plavix, heparin drip, seen by [facility] health cardiology Dr. Fletcher Anon, patient is taken to cardiac cath yesterday, cardiac cath yesterday showed severe restenosis of ostial second diagonal at the site of previous balloon angioplasty, patient had Cutting Balloon angioplasty yesterday by Dr. Fletcher Anon for ostial second diagonal without stent placement.  Patient received aggressive hydration because of her chronic kidney disease before and after the procedure.  Cardiology recommended aggressive risk factor modification with dual antiplatelet therapy with aspirin 81 mg p.o. daily, Plavix 75 mg p.o. daily,, Crestor 10 mg p.o. Daily,  2.  CKD stage III: Patient received IV hydration before and after the cath.  Renal function is stable with creatinine of 1.5.  Patient is to follow-up with cardiology as an outpatient in the office in 1 week for repeat med recheck in 1 week.  So hold the losartan until then as per cardiology recommendation. 3.  Relative bradycardia, cardiology recommended to discontinue Cardizem, low-dose Norvasc has been added, continue beta-blockers, aspirin, Plavix, statin.  Unable to use losartan because of contrast that she is here for cardiac cath and also chronic kidney disease.  #3 hyperlipidemia, LDL 35 on this admission.  LDL goal is less than 70, continue Crestor 10 mg p.o. Daily. #4 hypokalemia, replace the potassium 4.  Bradycardia, discontinue Cardizem, continue Norvasc as per  cardiology recommendation, spoke with Thurmond Butts, cardiology PA.. Depression, anxiety: Continue Xanax, Celexa, Wellbutrin. Noncompliance issues, discussed with cardiology. 6.  Essential hypertension: Controlled has some bradycardia so continue hydralazine, metoprolol but changed Cardizem to Norvasc.  Discharge Condition: stable Follow UP Outpatient cardiac rehab.     Discharge Instructions  and  Discharge Medications    Discharge Instructions    AMB Referral to Cardiac Rehabilitation  - Phase II   Complete by:  As directed    Diagnosis:  Coronary Stents     Allergies as of 09/21/2017      Reactions   Lipitor [atorvastatin] Other (See Comments)   Leg pains   Tramadol Other (See Comments)   Mouth feels like it's on fire      Medication List    STOP taking these medications   diltiazem 120 MG 24 hr capsule Commonly known as:  CARDIZEM CD     TAKE these medications   ALPRAZolam 1 MG tablet Commonly known as:  XANAX Take 1 tablet (1 mg total) by mouth daily as needed for anxiety.   amLODipine 5 MG tablet Commonly known as:  NORVASC Take 1 tablet (5 mg total) by mouth daily.   aspirin 81 MG chewable tablet Chew 1 tablet (81 mg total) by mouth daily.   buPROPion 150 MG 24 hr tablet Commonly known as:  WELLBUTRIN XL TAKE 1 TABLET BY MOUTH DAILY   CALCIUM 500/D PO Take 500 mg by mouth daily.   celecoxib 200 MG capsule Commonly known as:  CELEBREX Take 200 mg by mouth 2 (two) times daily as needed for moderate pain.   citalopram 40 MG tablet Commonly known as:  CELEXA Take 1 tablet (40 mg total) by mouth daily.   clopidogrel 75 MG tablet Commonly known as:  PLAVIX Take 1 tablet (75 mg total) by mouth daily with breakfast.   gabapentin 300 MG capsule Commonly known as:  NEURONTIN Take 1 capsule (300 mg total) by mouth 2 (two) times daily.   hydrALAZINE 50 MG tablet Commonly known as:  APRESOLINE Take 1 tablet (50 mg total) by mouth 3 (three) times daily.   hyoscyamine 0.125 MG SL tablet Commonly known as:  LEVSIN SL Place 1 tablet (0.125 mg total) under the tongue every 6 (six) hours as needed. What changed:  reasons to take this   levothyroxine 25 MCG tablet Commonly known as:  SYNTHROID, LEVOTHROID Take 1 tablet (25 mcg total) daily before breakfast by mouth.   losartan 100 MG tablet Commonly known as:  COZAAR Take 1 tablet (100 mg) by mouth once daily   magnesium oxide 400 MG tablet Commonly known as:  MAG-OX Take 1 tablet (400 mg  total) by mouth daily.   metoprolol succinate 50 MG 24 hr tablet Commonly known as:  TOPROL-XL Take 1 tablet (50 mg total) by mouth daily. Take with or immediately following a meal.   multivitamin with minerals Tabs tablet Take 1 tablet by mouth daily.   nitroGLYCERIN 0.4 MG SL tablet Commonly known as:  NITROSTAT Place 1 tablet (0.4 mg total) under the tongue every 5 (five) minutes as needed for chest pain.   ONE TOUCH ULTRA 2 w/Device Kit Use to check blood sugar once a day. Dx. E11.9   pantoprazole 40 MG tablet Commonly known as:  PROTONIX Take 1 tablet (40 mg total) by mouth 2 (two) times daily.   pioglitazone 15 MG tablet Commonly known as:  ACTOS Take 15 mg by mouth daily.   promethazine 25 MG  tablet Commonly known as:  PHENERGAN Take 25 mg by mouth every 6 (six) hours as needed for nausea or vomiting.   pyridostigmine 60 MG tablet Commonly known as:  MESTINON Take 60 mg by mouth 3 (three) times daily.   rosuvastatin 10 MG tablet Commonly known as:  CRESTOR Take 1 tablet (10 mg total) by mouth daily.   VITAMIN B 12 PO Take 1,000 mcg by mouth daily.         Diet and Activity recommendation: See Discharge Instructions above   Consults obtained - cardiology   Major procedures and Radiology Reports - PLEASE review detailed and final reports for all details, in brief -      Dg Chest Portable 1 View  Result Date: 09/19/2017 CLINICAL DATA:  Acute chest pain EXAM: PORTABLE CHEST 1 VIEW COMPARISON:  03/18/2017 and prior exams FINDINGS: Cardiomediastinal silhouette is unchanged. Surgical clips overlying the UPPER LEFT chest/LOWER neck noted. Mild elevation of the RIGHT hemidiaphragm again noted. There is no evidence of focal airspace disease, pulmonary edema, suspicious pulmonary nodule/mass, pleural effusion, or pneumothorax. No acute bony abnormalities are identified. IMPRESSION: No active disease. Electronically Signed   By: Margarette Canada M.D.   On: 09/19/2017  16:36    Micro Results    No results found for this or any previous visit (from the past 240 hour(s)).     Today   Subjective:   [employer] today  No chest pain, wants to go home.   Objective:   Blood pressure (!) 179/78, pulse (!) 53, temperature 98 F (36.7 C), temperature source Oral, resp. rate 14, height '5\' 2"'  (1.575 m), weight 81.9 kg, SpO2 100 %.   Intake/Output Summary (Last 24 hours) at 09/21/2017 0839 Last data filed at 09/21/2017 0347 Gross per 24 hour  Intake -  Output 1000 ml  Net -1000 ml    Exam Awake Alert, Oriented x 3, No new F.N deficits, Normal affect Notus.AT,PERRAL Supple Neck,No JVD, No cervical lymphadenopathy appriciated.  Symmetrical Chest wall movement, Good air movement bilaterally, CTAB RRR,No Gallops,Rubs or new Murmurs, No Parasternal Heave +ve B.Sounds, Abd Soft, Non tender, No organomegaly appriciated, No rebound -guarding or rigidity. No Cyanosis, Clubbing or edema, No new Rash or bruise  Data Review   CBC w Diff:  Lab Results  Component Value Date   WBC 11.7 (H) 09/21/2017   HGB 11.1 (L) 09/21/2017   HGB 12.7 11/15/2015   HCT 33.1 (L) 09/21/2017   HCT 38.2 11/15/2015   PLT 131 (L) 09/21/2017   PLT 276 11/15/2015   LYMPHOPCT 16 06/24/2017   LYMPHOPCT 6.1 12/30/2013   MONOPCT 6 06/24/2017   MONOPCT 1.1 12/30/2013   EOSPCT 3 06/24/2017   EOSPCT 0.1 12/30/2013   BASOPCT 1 06/24/2017   BASOPCT 0.2 12/30/2013    CMP:  Lab Results  Component Value Date   NA 140 09/21/2017   NA 143 07/09/2017   NA 136 12/30/2013   K 3.9 09/21/2017   K 4.8 12/30/2013   CL 113 (H) 09/21/2017   CL 107 12/30/2013   CO2 22 09/21/2017   CO2 23 12/30/2013   BUN 16 09/21/2017   BUN 20 07/09/2017   BUN 23 (H) 12/30/2013   CREATININE 1.56 (H) 09/21/2017   CREATININE 1.92 (H) 10/14/2016   GLU 189 02/03/2013   PROT 6.0 (L) 09/19/2017   PROT 7.1 11/15/2015   PROT 7.1 12/30/2013   ALBUMIN 3.4 (L) 09/19/2017   ALBUMIN 4.1 01/29/2016    ALBUMIN 3.5  12/30/2013   BILITOT 0.4 09/19/2017   BILITOT 0.3 11/15/2015   BILITOT 0.2 12/30/2013   ALKPHOS 127 (H) 09/19/2017   ALKPHOS 87 12/30/2013   AST 48 (H) 09/19/2017   AST 14 (L) 12/30/2013   ALT 96 (H) 09/19/2017   ALT 33 12/30/2013  .   Total Time in preparing paper work, data evaluation and todays exam - 45 minutes  Epifanio Lesches M.D on 09/21/2017 at 8:39 AM    Note: This dictation was prepared with Dragon dictation along with smaller phrase technology. Any transcriptional errors that result from this process are unintentional.

## 2017-09-21 NOTE — Progress Notes (Signed)
Progress Note  Patient Name: [name] Date of Encounter: 09/21/2017  Primary Cardiologist: [name]  Subjective   Status post POBA to the diagonal as below. Feels much better. No further chest pain. No SOB. Has ambulated in the room without issues. Post-cath labs stable.   Inpatient Medications    Scheduled Meds: . amLODipine  5 mg Oral Daily  . aspirin  81 mg Oral Daily  . buPROPion  150 mg Oral Daily  . calcium-vitamin D  1 tablet Oral BID  . citalopram  40 mg Oral Daily  . clopidogrel  75 mg Oral Q breakfast  . gabapentin  300 mg Oral BID  . hydrALAZINE  50 mg Oral TID  . insulin aspart  0-5 Units Subcutaneous QHS  . insulin aspart  0-9 Units Subcutaneous TID WC  . levothyroxine  25 mcg Oral QAC breakfast  . magnesium oxide  400 mg Oral Daily  . metoprolol succinate  50 mg Oral Daily  .  morphine injection  2 mg Intravenous Once  . multivitamin with minerals  1 tablet Oral Daily  . pantoprazole  40 mg Oral BID  . pyridostigmine  60 mg Oral Q8H  . rosuvastatin  10 mg Oral q1800  . sodium chloride flush  3 mL Intravenous Q12H   Continuous Infusions: . sodium chloride 75 mL/hr at 09/19/17 2045  . sodium chloride     PRN Meds: sodium chloride, acetaminophen **OR** acetaminophen, ALPRAZolam, hyoscyamine, morphine injection, nitroGLYCERIN, ondansetron **OR** ondansetron (ZOFRAN) IV, oxyCODONE-acetaminophen, promethazine, sodium chloride flush   Vital Signs    Vitals:   09/20/17 1829 09/20/17 1926 09/21/17 0342 09/21/17 0348  BP: (!) 156/74 (!) 157/80 (!) 156/74   Pulse: 78 62 (!) 50   Resp:  18 18   Temp:  98.3 F (36.8 C) 98 F (36.7 C)   TempSrc:  Oral Oral   SpO2:  99% 99%   Weight:    81.9 kg  Height:        Intake/Output Summary (Last 24 hours) at 09/21/2017 0742 Last data filed at 09/21/2017 0347 Gross per 24 hour  Intake -  Output 1000 ml  Net -1000 ml   Filed Weights   09/20/17 0403 09/20/17 1017 09/21/17 0348  Weight: 80.7 kg 80.7 kg  81.9 kg    Telemetry    NSR, 60s bpm - Personally Reviewed  ECG    n/a - Personally Reviewed  Physical Exam   GEN: No acute distress.   Neck: No JVD. Cardiac: RRR, no murmurs, rubs, or gallops. Right radial cardiac cath site without active bleeding, bruising, swelling, or erythema. Mild TTP. Radial pulse 2+.  Respiratory: Clear to auscultation bilaterally.  GI: Soft, nontender, non-distended.   MS: No edema; No deformity. Neuro:  Alert and oriented x 3; Nonfocal.  Psych: Normal affect.  Labs    Chemistry Recent Labs  Lab 09/19/17 1419 09/19/17 1558 09/20/17 0513 09/21/17 0256  NA 140  --  144 140  K 3.0*  --  3.2* 3.9  CL 116*  --  116* 113*  CO2 19*  --  23 22  GLUCOSE 236*  --  106* 113*  BUN 20  --  17 16  CREATININE 1.56*  --  1.47* 1.56*  CALCIUM 8.2*  --  8.0* 8.2*  PROT  --  6.0*  --   --   ALBUMIN  --  3.4*  --   --   AST  --  48*  --   --  ALT  --  96*  --   --   ALKPHOS  --  127*  --   --   BILITOT  --  0.4  --   --   GFRNONAA 38*  --  41* 38*  GFRAA 44*  --  48* 44*  ANIONGAP 5  --  5 5     Hematology Recent Labs  Lab 09/19/17 1419 09/20/17 0513 09/21/17 0256  WBC 12.5* 9.4 11.7*  RBC 4.65 4.02 4.09  HGB 12.6 11.1* 11.1*  HCT 37.8 32.4* 33.1*  MCV 81.3 80.6 80.9  MCH 27.2 27.5 27.2  MCHC 33.4 34.1 33.7  RDW 14.7* 14.4 14.8*  PLT 177 138* 131*    Cardiac Enzymes Recent Labs  Lab 09/19/17 1419 09/19/17 1912 09/19/17 2240  TROPONINI <0.03 <0.03 <0.03   No results for input(s): TROPIPOC in the last 168 hours.   BNPNo results for input(s): BNP, PROBNP in the last 168 hours.   DDimer No results for input(s): DDIMER in the last 168 hours.   Radiology    Dg Chest Portable 1 View  Result Date: 09/19/2017 IMPRESSION: No active disease. Electronically Signed   By: Margarette Canada M.D.   On: 09/19/2017 16:36    Cardiac Studies   LHC 09/20/2017: Conclusion     Non-stenotic 1st Diag lesion was previously treated.  Ost 1st Diag  lesion is 40% stenosed.  Ost 2nd Diag to 2nd Diag lesion is 99% stenosed.  Balloon angioplasty was performed.  Dist LAD lesion is 70% stenosed.  Prox Cx lesion is 40% stenosed.  Ost RCA to Prox RCA lesion is 40% stenosed.  Mid LAD-1 lesion is 30% stenosed.  Mid LAD-2 lesion is 5% stenosed.  Post intervention, there is a 30% residual stenosis.  Scoring balloon angioplasty was performed using a BALLOON WOLVERINE 2.50X10.   1. Significant one-vessel coronary artery disease with patent stents in the mid LAD and first diagonal. There is significant chronic disease in the distal LAD close to the apex.  Severe restenosis and ostial second diagonal at the site of previous angioplasty in May.  2. Mildly elevated left ventricular end-diastolic pressure. Left ventricular angiography was not performed due to chronic kidney disease. 3. Successful cutting balloon angioplasty of the ostial second diagonal without stent placement.   Recommendations: Aggressive treatment of risk factors. Hydrate overnight due to chronic kidney disease. A total of 75 mL of contrast was used. I still feel that the ostial second diagonal stenosis is not optimal for stent implantation due to angulation and bifurcation with the LAD.  If the patient develops recurrent restenosis in the same location, options include leaving the vessel to close completely and treat medically or bifurcation stenting of the diagonal and LAD.  Recommend uninterrupted dual antiplatelet therapy with Aspirin 81mg  daily and Clopidogrel 75mg  daily for a minimum of 1 month (POBA - Class I recommendation).     Patient Profile     47 y.o. female with history of CAD status post PCI to the LAD and D1as well as PTCA of D2 in 06/4625, chronic systolic CHF secondary to ischemic cardiomyopathy, hypertensive heart disease, CKD stage III-IV, hyperlipidemia, prior medication noncompliance, recurrent strokes and falls with concern for possible MS,  obesity status post gastric bypass surgery, and aortic arch aneurysm who presented to [facility] with chest pain.  Assessment & Plan    1. Unstable angina/CAD as above: -Status post POBA to the diagonal as above -Currently chest pain free -DAPT with ASA and Plavix -For  residual symptoms we will either need to let the diagonal close completely (will have pain for brief period during that time frame) vs complex bifurcation stenting of the diagonal and LAD -Aggressive risk factor modification  -Continue Toprol and Crestor as below -Post-cath instructions   2. CKD stage III: -Renal function is stable post-cath -We will send a message to the office for her to stop by and have a bmet in 1 week  3. HLD: -LDL 35 this admission -Goal LDL < 70 -Continue Crestor 10 mg daily  4. HTN: -BP slightly elevated -Continue Norvasc  -Continue Toprol XL 50 mg daily -Continue hydralazine   5. Bradycardia: -Improved off Cardizem  6. Hypokalemia: -Improving  For questions or updates, please contact [facility] Please consult [email or web address] for contact info under Cardiology/STEMI.    Signed, Christell Faith, PA-C [place] Pager: [phone] 09/21/2017, 7:42 AM

## 2017-09-21 NOTE — Discharge Instructions (Signed)
Discharge instructions given. Pt verbalizes understanding. 

## 2017-09-21 NOTE — Care Management (Signed)
Is not being discharged home on cost prohibitive antiplatelet medication.  No discharge needs identified by members of the care team

## 2017-09-22 ENCOUNTER — Telehealth: Payer: Self-pay | Admitting: Cardiovascular Disease

## 2017-09-22 ENCOUNTER — Telehealth: Payer: Self-pay | Admitting: *Deleted

## 2017-09-22 NOTE — Telephone Encounter (Signed)
Patient calling stating she was released from hospital after having Cath done.  She states she has a balloon in  She states she was walking down to the mail box and started to have some pains in her chest She states she kept going but didn't know if this was normal or not.  She is still having those pains. Would like to make sure if she needs to go ED   Please advise

## 2017-09-22 NOTE — Telephone Encounter (Signed)
Patient called stating that she got discharged from the hospital yesterday. She had a cardiac cath and a successful balloon angioplasty. She stated that when she walked to the mailbox today she had chest pain and shortness of breath. Once she sat down and rested the pain stopped and her breathing returned to normal. She stated that she has felt fine since then.  An appointment has been made for her tomorrow with Murray Hodgkins, NP, per his permission. She has been advised that if the pain occurs again she should go to the ED for evaluation. She verbalized her understanding.

## 2017-09-22 NOTE — Telephone Encounter (Signed)
The patient is coming in to see Ignacia Bayley, NP on 09/23/17 for follow up exertional chest pain/ SOB today while walking to the mailbox.  Will address follow up lab at her office visit.

## 2017-09-22 NOTE — Telephone Encounter (Signed)
-----   Message from Rise Mu, PA-C sent at 09/21/2017  7:48 AM EDT ----- Patient needs follow up bmet in 1 week. Dx: CKD stage III

## 2017-09-23 ENCOUNTER — Ambulatory Visit (INDEPENDENT_AMBULATORY_CARE_PROVIDER_SITE_OTHER): Payer: Medicare Other | Admitting: Nurse Practitioner

## 2017-09-23 ENCOUNTER — Encounter: Payer: Self-pay | Admitting: Nurse Practitioner

## 2017-09-23 VITALS — BP 110/76 | HR 88 | Ht 62.0 in | Wt 179.0 lb

## 2017-09-23 DIAGNOSIS — E1122 Type 2 diabetes mellitus with diabetic chronic kidney disease: Secondary | ICD-10-CM

## 2017-09-23 DIAGNOSIS — I25118 Atherosclerotic heart disease of native coronary artery with other forms of angina pectoris: Secondary | ICD-10-CM | POA: Diagnosis not present

## 2017-09-23 DIAGNOSIS — I10 Essential (primary) hypertension: Secondary | ICD-10-CM | POA: Diagnosis not present

## 2017-09-23 DIAGNOSIS — I200 Unstable angina: Secondary | ICD-10-CM | POA: Diagnosis not present

## 2017-09-23 DIAGNOSIS — N183 Chronic kidney disease, stage 3 unspecified: Secondary | ICD-10-CM

## 2017-09-23 DIAGNOSIS — E785 Hyperlipidemia, unspecified: Secondary | ICD-10-CM | POA: Diagnosis not present

## 2017-09-23 MED ORDER — ISOSORBIDE MONONITRATE ER 30 MG PO TB24: 30.0000 mg | ORAL_TABLET | Freq: Every day | ORAL | 3 refills | Status: DC

## 2017-09-23 NOTE — Progress Notes (Signed)
Office Visit    Patient Name: [name] Date of Encounter: 09/23/2017  Primary Care Provider:  No primary care provider on file. Primary Cardiologist:  Ida Rogue, MD  Chief Complaint    47 year old female with a history of CAD status post prior interventions, hypertension, hyperlipidemia, diabetes, stage III chronic kidney disease, prior strokes, obesity status post gastric bypass, prior left carotid to subclavian artery bypass with subclavian artery ligation, anemia, bipolar disorder, and aortic arch aneurysm who presents for follow-up after recent PTCA.  Past Medical History    Past Medical History:  Diagnosis Date  . Anemia    iron deficiency anemia  . Aortic arch aneurysm ([place])   . Bipolar disorder ([place])   . BRCA negative 2014  . CAD (coronary artery disease)    a. 08/2003 Cath: LAD 30-40-med Rx; b. 11/2014 PCI: LAD 5m(3.25x23 Xience Alpine DES); c. 06/2015 PCI: D1 (2.25x12 Resolute Integrity DES); d. 06/2017 PCI: Patent mLAD stent, D2 95 (PTCA); e. 09/2017 Cath/PCI: LM nl, LAD 368m70d, D1 40, D2 99ost (CBA), LCX 40p, RCA 40ost/p.  . Marland KitchenKD (chronic kidney disease), stage III ([facility]  . Colon polyp   . CVA (cerebral vascular accident) ([facility]   Left side weakness.   . Diabetes ([facility]  . Family history of breast cancer    BRCA neg 2014  . Gastric ulcer 04/27/2011  . History of echocardiogram    a. 03/2017 Echo: EF 60-65%, no rwma.  . Marland KitchenTN (hypertension)   . Hyperlipemia   . Hypothyroid   . Malignant melanoma of skin of scalp ([facility]  . MI, acute, non ST segment elevation ([facility]  . Neuromuscular disorder ([facility]  . S/P drug eluting coronary stent placement 06/04/2015  . Sepsis ([facility]2/14/2019   Past Surgical History:  Procedure Laterality Date  . APPENDECTOMY    . CARDIAC CATHETERIZATION N/A 11/09/2014   Procedure: Coronary Angiography;  Surgeon: TiMinna MerrittsMD;  [place];  Service: Cardiovascular;  Laterality: N/A;  . CARDIAC  CATHETERIZATION N/A 11/12/2014   Procedure: Coronary Stent Intervention;  Surgeon: AlIsaias CowmanMD;  [place];  Service: Cardiovascular;  Laterality: N/A;  . CARDIAC CATHETERIZATION N/A 04/18/2015   Procedure: Left Heart Cath and Coronary Angiography;  Surgeon: TiMinna MerrittsMD;  [place];  Service: Cardiovascular;  Laterality: N/A;  . CARDIAC CATHETERIZATION Left 06/04/2015   Procedure: Left Heart Cath and Coronary Angiography;  Surgeon: MuWellington HampshireMD;  [place] [facility];  Service: Cardiovascular;  Laterality: Left;  . CARDIAC CATHETERIZATION N/A 06/04/2015   Procedure: Coronary Stent Intervention;  Surgeon: MuWellington HampshireMD;  [place] [facility] [place];  Service: Cardiovascular;  Laterality: N/A;  . CESAREAN SECTION  2001  . CHOLECYSTECTOMY N/A 11/18/2016   Procedure: LAPAROSCOPIC CHOLECYSTECTOMY WITH INTRAOPERATIVE CHOLANGIOGRAM;  Surgeon: SaChristene LyeMD;  [facility];  Service: General;  Laterality: N/A;  . COLONOSCOPY WITH PROPOFOL N/A 04/27/2016   Procedure: COLONOSCOPY WITH PROPOFOL;  Surgeon: DaLucilla LameMD;  [place] [place] Service: Endoscopy;  Laterality: N/A;  . CORONARY ANGIOPLASTY    . CORONARY BALLOON ANGIOPLASTY N/A 06/29/2017   Procedure: CORONARY BALLOON ANGIOPLASTY;  Surgeon: ArWellington HampshireMD;  [place] [facility];  Service: Cardiovascular;  Laterality: N/A;  . CORONARY BALLOON ANGIOPLASTY N/A 09/20/2017   Procedure: CORONARY BALLOON ANGIOPLASTY;  Surgeon: ArWellington HampshireMD;  Location:  [place] CV LAB;  Service: Cardiovascular;  Laterality: N/A;  . DILATION AND CURETTAGE OF UTERUS    . ESOPHAGOGASTRODUODENOSCOPY (EGD) WITH PROPOFOL N/A 09/14/2014   Procedure: ESOPHAGOGASTRODUODENOSCOPY (EGD) WITH PROPOFOL;  Surgeon: Josefine Class, MD;  Location[facility] ENDOSCOPY;  Service: Endoscopy;  Laterality: N/A;  . ESOPHAGOGASTRODUODENOSCOPY (EGD)  WITH PROPOFOL N/A 04/27/2016   Procedure: ESOPHAGOGASTRODUODENOSCOPY (EGD) WITH PROPOFOL;  Surgeon: [name], MD;  Location: [facility];  Service: Endoscopy;  Laterality: N/A;  Diabetic - oral meds  . GASTRIC BYPASS  09/2009   [facility]   . Left Carotid to sublcavian artery bypass w/ subclavian artery ligation     a. Performed @ [facility].  . LEFT HEART CATH AND CORONARY ANGIOGRAPHY Left 06/29/2017   Procedure: LEFT HEART CATH AND CORONARY ANGIOGRAPHY;  Surgeon: Wellington Hampshire, MD;  Location: [place] CV LAB;  Service: Cardiovascular;  Laterality: Left;  . LEFT HEART CATH AND CORONARY ANGIOGRAPHY N/A 09/20/2017   Procedure: LEFT HEART CATH AND CORONARY ANGIOGRAPHY;  Surgeon: Wellington Hampshire, MD;  Location: [place] CV LAB;  Service: Cardiovascular;  Laterality: N/A;  . MELANOMA EXCISION  2016   Dr. Evorn Gong  . [place]  2002  . RIGHT OOPHORECTOMY    . SHOULDER ARTHROSCOPY WITH OPEN ROTATOR CUFF REPAIR Right 01/07/2016   Procedure: SHOULDER ARTHROSCOPY WITH DEBRIDMENT, SUBACHROMIAL DECOMPRESSION;  Surgeon: Corky Mull, MD;  Location: [facility] ORS;  Service: Orthopedics;  Laterality: Right;  . SHOULDER ARTHROSCOPY WITH OPEN ROTATOR CUFF REPAIR Right 03/16/2017   Procedure: SHOULDER ARTHROSCOPY WITH OPEN ROTATOR CUFF REPAIR POSSIBLE BICEPS TENODESIS;  Surgeon: Corky Mull, MD;  Location: [facility] ORS;  Service: Orthopedics;  Laterality: Right;  . TRIGGER FINGER RELEASE Right     Middle Finger    Allergies  Allergies  Allergen Reactions  . Lipitor [Atorvastatin] Other (See Comments)    Leg pains  . Tramadol Other (See Comments)    Mouth feels like it's on fire    History of Present Illness    47 year old female with a history of CAD status post prior interventions, hypertension, hyperlipidemia, type 2 diabetes mellitus, stage III chronic kidney disease, prior strokes, obesity status post gastric bypass, prior left carotid to subclavian artery  bypass with subclavian artery ligation, anemia, bipolar disorder, and aortic arch aneurysm.  She was initially diagnosed with coronary artery disease in July 2005, at which time catheterization revealed moderate LAD stenosis, which was medically managed.  More recently, in October 2016, she required PCI and drug-eluting stent placement to her mid LAD.  May 2017 catheter ablation revealed patency of the LAD stent but she had developed significant first diagonal disease and this was successfully treated with a drug-eluting stent.  She recurrent angina in May 2019 and catheterization at that time showed severe stenosis in the second diagonal and this was treated with balloon angioplasty.  She did well for a few months but began to experience recurrent chest discomfort in late July and was subsequently admitted to [facility] regional over this past weekend.  She ruled out and underwent diagnostic catheterization revealing restenosis within the second diagonal.  This was treated with Cutting Balloon angioplasty and she was discharged home on August 20.  She says she felt relatively well on August 20 but on August 21, she went on to get her mail and developed recurrent substernal chest discomfort associated with dyspnea.  Symptoms lasted about 5 to 10 minutes and resolved with rest.  She contacted our office and  was added onto my schedule today.  She had no recurrent symptoms the remainder of the day on August 21, and this morning she got up and did some chores around the house and was initially fine but then developed recurrent chest discomfort with dyspnea when sweeping the floor.  She did note some shortness of breath when walking into clinic today.  ECG is nonacute and she is hemodynamically stable.  She is currently chest pain-free.  We did have a long conversation about her anatomy and small vessel disease with presumed high risk for restenosis.  Home Medications    Prior to Admission medications   Medication Sig  Start Date End Date Taking? Authorizing Provider  ALPRAZolam Duanne Moron) 1 MG tablet Take 1 tablet (1 mg total) by mouth daily as needed for anxiety. 08/18/17  Yes [name], MD  amLODipine (NORVASC) 5 MG tablet Take 1 tablet (5 mg total) by mouth daily. 09/21/17  Yes Epifanio Lesches, MD  aspirin 81 MG chewable tablet Chew 1 tablet (81 mg total) by mouth daily. 07/01/17  Yes Dunn, Areta Haber, PA-C  Blood Glucose Monitoring Suppl (ONE TOUCH ULTRA 2) w/Device KIT Use to check blood sugar once a day. Dx. E11.9 03/18/17  Yes [name], MD  buPROPion (WELLBUTRIN XL) 150 MG 24 hr tablet TAKE 1 TABLET BY MOUTH DAILY 08/10/17  Yes [name], MD  Calcium Carbonate-Vitamin D (CALCIUM 500/D PO) Take 500 mg by mouth daily.    Yes [provider]  celecoxib (CELEBREX) 200 MG capsule Take 200 mg by mouth 2 (two) times daily as needed for moderate pain.    Yes [provider]  citalopram (CELEXA) 40 MG tablet Take 1 tablet (40 mg total) by mouth daily. 06/08/17  Yes [name], MD  clopidogrel (PLAVIX) 75 MG tablet Take 1 tablet (75 mg total) by mouth daily with breakfast. 07/01/17  Yes Dunn, Areta Haber, PA-C  Cyanocobalamin (VITAMIN B 12 PO) Take 1,000 mcg by mouth daily.    Yes [provider]  gabapentin (NEURONTIN) 300 MG capsule Take 1 capsule (300 mg total) by mouth 2 (two) times daily. 06/04/17  Yes [name], MD  hyoscyamine (LEVSIN SL) 0.125 MG SL tablet Place 1 tablet (0.125 mg total) under the tongue every 6 (six) hours as needed. Patient taking differently: Place 0.125 mg under the tongue every 6 (six) hours as needed for cramping.  02/01/17  Yes [name], MD  levothyroxine (SYNTHROID, LEVOTHROID) 25 MCG tablet Take 1 tablet (25 mcg total) daily before breakfast by mouth. 12/18/16  Yes [name], MD  magnesium oxide (MAG-OX) 400 MG tablet Take 1 tablet (400 mg total) by mouth daily. 07/12/17  Yes Dunn, Areta Haber, PA-C  metoprolol succinate  (TOPROL-XL) 50 MG 24 hr tablet Take 1 tablet (50 mg total) by mouth daily. Take with or immediately following a meal. 07/01/17  Yes Dunn, Areta Haber, PA-C  Multiple Vitamin (MULTIVITAMIN WITH MINERALS) TABS tablet Take 1 tablet by mouth daily. 04/14/17  Yes Gouru, Illene Silver, MD  nitroGLYCERIN (NITROSTAT) 0.4 MG SL tablet Place 1 tablet (0.4 mg total) under the tongue every 5 (five) minutes as needed for chest pain. 06/02/17  Yes Gollan, Kathlene November, MD  pantoprazole (PROTONIX) 40 MG tablet Take 1 tablet (40 mg total) by mouth 2 (two) times daily. 05/11/16  Yes [name], MD  pioglitazone (ACTOS) 15 MG tablet Take 15 mg by mouth daily.  08/03/17 08/03/18 Yes [provider]  promethazine (PHENERGAN) 25 MG tablet Take 25 mg by mouth every 6 (six) hours as needed for nausea or vomiting.    Yes [provider]  pyridostigmine (MESTINON) 60 MG tablet Take 60 mg by mouth 3 (three) times daily.  07/26/17  Yes [provider]  rosuvastatin (CRESTOR) 10 MG tablet Take 1 tablet (10 mg total) by mouth daily. 08/10/17  Yes Minna Merritts, MD  hydrALAZINE (APRESOLINE) 50 MG tablet Take 1 tablet (50 mg total) by mouth 3 (three) times daily. 06/18/17 09/16/17  Rise Mu, PA-C    Review of Systems    Exertional chest discomfort as outlined above.  She denies palpitations, pnd, orthopnea, n, v, dizziness, syncope, edema, weight gain, or early satiety. All other systems reviewed and are otherwise negative except as noted above.  Physical Exam    VS:  BP 110/76 (BP Location: Left Arm, Patient Position: Sitting, Cuff Size: Normal)   Pulse 88   Ht _0  (1.575 m)   Wt 179 lb (81.2 kg)   BMI 32.74 kg/m  , BMI Body mass index is 32.74 kg/m. GEN: Well nourished, well developed, in no acute distress.  HEENT: normal.  Neck: Supple, no JVD, carotid bruits, or masses. Cardiac: RRR, no murmurs, rubs, or gallops. No clubbing, cyanosis, edema.  Radials/DP/PT 2+ and equal bilaterally.  Respiratory:   Respirations regular and unlabored, clear to auscultation bilaterally. GI: Soft, nontender, nondistended, BS + x 4. MS: no deformity or atrophy. Skin: warm and dry, no rash. Neuro:  Strength and sensation are intact. Psych: Normal affect.  Accessory Clinical Findings    ECG -regular sinus rhythm, 88, LVH with reposition abnormality no acute changes.  Assessment & Plan    1.  Coronary artery disease/unstable angina: Status post repeat percutaneous intervention and cutting balloon angioplasty to the second diagonal on August 19.  This was previously intervened upon in May of this year.  She had recurrent exertional chest discomfort and dyspnea yesterday and again this morning.  Symptoms were moderate in nature and lasted about 5 to 10 minutes prior to resolving with rest.  Symptoms are similar to prior angina.  We discussed her symptoms as well as her anatomy at length today.  Most recent catheterization/PCI note reviewed and also discussed with Drs. Gollan and Arida.  Given small vessel disease and chronic kidney disease, we would like to maximize medical therapy prior to considering repeat catheterization.  I am adding Imdur 30 mg daily.  She says she was on this once before but cannot member why it was stopped.  She does not remember having significant headaches while on it.  Of note, she had also previously been on Ranexa and said that it was discontinued because it was expensive and did not work.  She understands that she has recurrent and prolonged symptoms, she will likely need to call EMS for presentation to the hospital.  Otherwise I will plan to see her back next week.  Continue aspirin, Plavix, beta-blocker, and statin.  2.  Essential hypertension: Stable.  Continue beta-blocker, hydralazine, and calcium channel blocker.  3.  Hyperlipidemia: LDL 35 in August 19.  Continue high potency statin therapy.  4.  Type 2 diabetes mellitus: A1c 6.0.  She remains on Actos and is followed by primary  care.  5.  Stage III chronic kidney disease: Creatinine relatively stable at discharge on August 20 at 1.56.  She has follow-up with nephrology next week.  6.  Disposition: Follow-up in 1 week.  Murray Hodgkins, NP 09/23/2017, 4:19 PM

## 2017-09-23 NOTE — Patient Instructions (Addendum)
Medication Instructions: - Your physician has recommended you make the following change in your medication:   1) START imdur (isosorbide mononitrate) 30 mg- take 1 tablet by mouth once daily  Labwork: - none ordered  Procedures/Testing: - none ordered  Follow-Up: - Your physician recommends that you schedule a follow-up appointment in: 1 week with Ignacia Bayley, NP  Schedule 09/30/17 at 1:30PM   Any Additional Special Instructions Will Be Listed Below (If Applicable).     If you need a refill on your cardiac medications before your next appointment, please call your pharmacy.

## 2017-09-27 DIAGNOSIS — I10 Essential (primary) hypertension: Secondary | ICD-10-CM | POA: Diagnosis not present

## 2017-09-27 DIAGNOSIS — N183 Chronic kidney disease, stage 3 (moderate): Secondary | ICD-10-CM | POA: Diagnosis not present

## 2017-09-27 DIAGNOSIS — D631 Anemia in chronic kidney disease: Secondary | ICD-10-CM | POA: Diagnosis not present

## 2017-09-27 DIAGNOSIS — N2581 Secondary hyperparathyroidism of renal origin: Secondary | ICD-10-CM | POA: Diagnosis not present

## 2017-09-27 DIAGNOSIS — E1122 Type 2 diabetes mellitus with diabetic chronic kidney disease: Secondary | ICD-10-CM | POA: Diagnosis not present

## 2017-09-30 ENCOUNTER — Ambulatory Visit (INDEPENDENT_AMBULATORY_CARE_PROVIDER_SITE_OTHER): Payer: Medicare Other | Admitting: Physician Assistant

## 2017-09-30 ENCOUNTER — Encounter: Payer: Self-pay | Admitting: Physician Assistant

## 2017-09-30 VITALS — BP 106/68 | HR 64 | Ht 62.0 in | Wt 177.2 lb

## 2017-09-30 DIAGNOSIS — N183 Chronic kidney disease, stage 3 unspecified: Secondary | ICD-10-CM

## 2017-09-30 DIAGNOSIS — Z79899 Other long term (current) drug therapy: Secondary | ICD-10-CM | POA: Diagnosis not present

## 2017-09-30 DIAGNOSIS — I25118 Atherosclerotic heart disease of native coronary artery with other forms of angina pectoris: Secondary | ICD-10-CM | POA: Diagnosis not present

## 2017-09-30 DIAGNOSIS — E785 Hyperlipidemia, unspecified: Secondary | ICD-10-CM | POA: Diagnosis not present

## 2017-09-30 DIAGNOSIS — I10 Essential (primary) hypertension: Secondary | ICD-10-CM | POA: Diagnosis not present

## 2017-09-30 DIAGNOSIS — I200 Unstable angina: Secondary | ICD-10-CM

## 2017-09-30 MED ORDER — LOSARTAN POTASSIUM 50 MG PO TABS: 50.0000 mg | ORAL_TABLET | Freq: Every day | ORAL | 3 refills | Status: DC

## 2017-09-30 MED ORDER — ISOSORBIDE MONONITRATE ER 30 MG PO TB24: ORAL_TABLET | ORAL | 6 refills | Status: DC

## 2017-09-30 NOTE — Progress Notes (Signed)
Cardiology Office Note Date:  09/30/2017  Patient ID:  [name], [name] 29-Feb-1970, MRN [id number] PCP:  [name], MD  Cardiologist:  Dr. [name], MD    Chief Complaint: Follow up  History of Present Illness: [name] is a 47 y.o. female with history of CAD status post prior interventions, hypertension, hyperlipidemia, type 2 diabetes mellitus, stage III chronic kidney disease, prior strokes, obesity status post gastric bypass, prior left carotid to subclavian artery bypass with subclavian artery ligation, anemia, bipolar disorder, and aortic arch aneurysm who presents for follow up of her CAD.  She was initially diagnosed with CAD in 08/2003, at which time catheterization revealed moderate LAD stenosis, which was medically managed. More recently, in 11/2014, she required PCI and drug-eluting stent placement to her mid LAD. In 06/2015 repeat LHC revealed patency of the LAD stent but she had developed significant first diagonal disease and this was successfully treated with a drug-eluting stent. She had recurrent angina in 06/2017 and catheterization at that time showed severe stenosis in the second diagonal and this was treated with balloon angioplasty given vessel size and angulation. She did well for a few months but began to experience recurrent chest discomfort in late 08/2017 and was subsequently admitted to [facility]. She ruled out and underwent diagnostic LHC that showed restenosis within the second diagonal. This was treated with cutting balloon angioplasty as this vessel was again not felt to be optimal for stenting due to angulation and bifurcation with the LAD. In hospital follow up (as a work in) on 8/22, she was doing relatively well. She did have an episode of chest pain with associated SOB while walking to get the mail that improved with rest prompting her to contact our office and be worked in. She noted some dyspnea while sweeping the floor prior to coming to her  appointment on 8/22. Vitals were stable. EKG was not acute. She was chest pain free at the time of her visit. She was started on Imdur 30 mg (previously on this medication though stopped secondary to headache). She reported that Ranexa did not help.   Her Imdur was increased via my chart message to 60 mg daily.  She reports the Imdur is helping some.  She does continue to have some breakthrough angina, with 2 episodes since last being seen.  Both of these episodes occurred at rest with one being at church and the other being the last evening while laying in bed.  Both episodes responded to sublingual nitroglycerin x1.  Pain was unchanged from her baseline.  She continues to note decreased energy and really has to pace herself.  She has not had any falls since she was last seen.  No BRBPR or melena.  No dizziness, presyncope, or syncope.  She is currently chest pain-free.   Past Medical History:  Diagnosis Date  . Anemia    iron deficiency anemia  . Aortic arch aneurysm ([place])   . Bipolar disorder ([place])   . BRCA negative 2014  . CAD (coronary artery disease)    a. 08/2003 Cath: LAD 30-40-med Rx; b. 11/2014 PCI: LAD 61m(3.25x23 Xience Alpine DES); c. 06/2015 PCI: D1 (2.25x12 Resolute Integrity DES); d. 06/2017 PCI: Patent mLAD stent, D2 95 (PTCA); e. 09/2017 Cath/PCI: LM nl, LAD 330m70d, D1 40, D2 99ost (CBA), LCX 40p, RCA 40ost/p.  . Marland KitchenKD (chronic kidney disease), stage III ([place]  . Colon polyp   . CVA (cerebral vascular accident) ([place]  Left side weakness.   . Diabetes ([place])   . Family history of breast cancer    BRCA neg 2014  . Gastric ulcer 04/27/2011  . History of echocardiogram    a. 03/2017 Echo: EF 60-65%, no rwma.  Marland Kitchen HTN (hypertension)   . Hyperlipemia   . Hypothyroid   . Malignant melanoma of skin of scalp ([place])   . MI, acute, non ST segment elevation ([place])   . Neuromuscular disorder ([place])   . S/P drug eluting coronary stent placement 06/04/2015  . Sepsis ([place]) 03/18/2017     Past Surgical History:  Procedure Laterality Date  . APPENDECTOMY    . CARDIAC CATHETERIZATION N/A 11/09/2014   Procedure: Coronary Angiography;  Surgeon: Minna Merritts, MD;  Location: [place] [facility];  Service: Cardiovascular;  Laterality: N/A;  . CARDIAC CATHETERIZATION N/A 11/12/2014   Procedure: Coronary Stent Intervention;  Surgeon: Isaias Cowman, MD;  Location: [place] [facility];  Service: Cardiovascular;  Laterality: N/A;  . CARDIAC CATHETERIZATION N/A 04/18/2015   Procedure: Left Heart Cath and Coronary Angiography;  Surgeon: Minna Merritts, MD;  Location: [place] [facility] LAB;  Service: Cardiovascular;  Laterality: N/A;  . CARDIAC CATHETERIZATION Left 06/04/2015   Procedure: Left Heart Cath and Coronary Angiography;  Surgeon: Wellington Hampshire, MD;  Location: [place] CV LAB;  Service: Cardiovascular;  Laterality: Left;  . CARDIAC CATHETERIZATION N/A 06/04/2015   Procedure: Coronary Stent Intervention;  Surgeon: Wellington Hampshire, MD;  Location: [place] CV LAB;  Service: Cardiovascular;  Laterality: N/A;  . CESAREAN SECTION  2001  . CHOLECYSTECTOMY N/A 11/18/2016   Procedure: LAPAROSCOPIC CHOLECYSTECTOMY WITH INTRAOPERATIVE CHOLANGIOGRAM;  Surgeon: Christene Lye, MD;  Location: [facility] ORS;  Service: General;  Laterality: N/A;  . COLONOSCOPY WITH PROPOFOL N/A 04/27/2016   Procedure: COLONOSCOPY WITH PROPOFOL;  Surgeon: [name], MD;  Location: [place];  Service: Endoscopy;  Laterality: N/A;  . CORONARY ANGIOPLASTY    . CORONARY BALLOON ANGIOPLASTY N/A 06/29/2017   Procedure: CORONARY BALLOON ANGIOPLASTY;  Surgeon: Wellington Hampshire, MD;  Location: [place] CV LAB;  Service: Cardiovascular;  Laterality: N/A;  . CORONARY BALLOON ANGIOPLASTY N/A 09/20/2017   Procedure: CORONARY BALLOON ANGIOPLASTY;  Surgeon: Wellington Hampshire, MD;  Location: [place] CV LAB;  Service: Cardiovascular;  Laterality: N/A;  . DILATION AND CURETTAGE OF  UTERUS    . ESOPHAGOGASTRODUODENOSCOPY (EGD) WITH PROPOFOL N/A 09/14/2014   Procedure: ESOPHAGOGASTRODUODENOSCOPY (EGD) WITH PROPOFOL;  Surgeon: Josefine Class, MD;  Location: [facility] ENDOSCOPY;  Service: Endoscopy;  Laterality: N/A;  . ESOPHAGOGASTRODUODENOSCOPY (EGD) WITH PROPOFOL N/A 04/27/2016   Procedure: ESOPHAGOGASTRODUODENOSCOPY (EGD) WITH PROPOFOL;  Surgeon: [name], MD;  Location: [place];  Service: Endoscopy;  Laterality: N/A;  Diabetic - oral meds  . GASTRIC BYPASS  09/2009   [facility]   . Left Carotid to sublcavian artery bypass w/ subclavian artery ligation     a. Performed @ [place].  . LEFT HEART CATH AND CORONARY ANGIOGRAPHY Left 06/29/2017   Procedure: LEFT HEART CATH AND CORONARY ANGIOGRAPHY;  Surgeon: Wellington Hampshire, MD;  Location: [place] CV LAB;  Service: Cardiovascular;  Laterality: Left;  . LEFT HEART CATH AND CORONARY ANGIOGRAPHY N/A 09/20/2017   Procedure: LEFT HEART CATH AND CORONARY ANGIOGRAPHY;  Surgeon: Wellington Hampshire, MD;  Location: [place] CV LAB;  Service: Cardiovascular;  Laterality: N/A;  . MELANOMA EXCISION  2016   Dr. Evorn Gong  . [place]  2002  . RIGHT  OOPHORECTOMY    . SHOULDER ARTHROSCOPY WITH OPEN ROTATOR CUFF REPAIR Right 01/07/2016   Procedure: SHOULDER ARTHROSCOPY WITH DEBRIDMENT, SUBACHROMIAL DECOMPRESSION;  Surgeon: Corky Mull, MD;  Location: [facility] OR[facility];  Service: Orthopedics;  Laterality: Right;  . SHOULDER ARTHROSCOPY WITH OPEN ROTATOR CUFF REPAIR Right 03/16/2017   Procedure: SHOULDER ARTHROSCOPY WITH OPEN ROTATOR CUFF REPAIR POSSIBLE BICEPS TENODESIS;  Surgeon: Corky Mull, MD;  Location: [facility] OR[facility];  Service: Orthopedics;  Laterality: Right;  . TRIGGER FINGER RELEASE Right     Middle Finger    Current Meds  Medication Sig  . ALPRAZolam (XANAX) 1 MG tablet Take 1 tablet (1 mg total) by mouth daily as needed for anxiety.  Marland Kitchen amLODipine (NORVASC) 5 MG tablet Take 1 tablet (5 mg total) by  mouth daily.  Marland Kitchen aspirin 81 MG chewable tablet Chew 1 tablet (81 mg total) by mouth daily.  . Blood Glucose Monitoring Suppl (ONE TOUCH ULTRA 2) w/Device KIT Use to check blood sugar once a day. Dx. E11.9  . buPROPion (WELLBUTRIN XL) 150 MG 24 hr tablet TAKE 1 TABLET BY MOUTH DAILY  . Calcium Carbonate-Vitamin D (CALCIUM 500/D PO) Take 500 mg by mouth daily.   . celecoxib (CELEBREX) 200 MG capsule Take 200 mg by mouth 2 (two) times daily as needed for moderate pain.   . citalopram (CELEXA) 40 MG tablet Take 1 tablet (40 mg total) by mouth daily.  . clopidogrel (PLAVIX) 75 MG tablet Take 1 tablet (75 mg total) by mouth daily with breakfast.  . Cyanocobalamin (VITAMIN B 12 PO) Take 1,000 mcg by mouth daily.   Marland Kitchen gabapentin (NEURONTIN) 300 MG capsule Take 1 capsule (300 mg total) by mouth 2 (two) times daily.  . hyoscyamine (LEVSIN SL) 0.125 MG SL tablet Place 1 tablet (0.125 mg total) under the tongue every 6 (six) hours as needed. (Patient taking differently: Place 0.125 mg under the tongue every 6 (six) hours as needed for cramping. )  . isosorbide mononitrate (IMDUR) 30 MG 24 hr tablet Take 1 tablet (30 mg total) by mouth daily.  Marland Kitchen levothyroxine (SYNTHROID, LEVOTHROID) 25 MCG tablet Take 1 tablet (25 mcg total) daily before breakfast by mouth.  . losartan (COZAAR) 100 MG tablet Take 100 mg by mouth daily.  . magnesium oxide (MAG-OX) 400 MG tablet Take 1 tablet (400 mg total) by mouth daily.  . metoprolol succinate (TOPROL-XL) 50 MG 24 hr tablet Take 1 tablet (50 mg total) by mouth daily. Take with or immediately following a meal.  . Multiple Vitamin (MULTIVITAMIN WITH MINERALS) TABS tablet Take 1 tablet by mouth daily.  . nitroGLYCERIN (NITROSTAT) 0.4 MG SL tablet Place 1 tablet (0.4 mg total) under the tongue every 5 (five) minutes as needed for chest pain.  . pantoprazole (PROTONIX) 40 MG tablet Take 1 tablet (40 mg total) by mouth 2 (two) times daily.  . pioglitazone (ACTOS) 15 MG tablet Take 15  mg by mouth daily.   . promethazine (PHENERGAN) 25 MG tablet Take 25 mg by mouth every 6 (six) hours as needed for nausea or vomiting.   . pyridostigmine (MESTINON) 60 MG tablet Take 60 mg by mouth 3 (three) times daily.   . rosuvastatin (CRESTOR) 10 MG tablet Take 1 tablet (10 mg total) by mouth daily.    Allergies:   Lipitor [atorvastatin] and Tramadol   Social History:  The patient  reports that she quit smoking about 23 years ago. Her smoking use included cigarettes. She has never used smokeless tobacco. She  reports that she does not drink alcohol or use drugs.   Family History:  The patient's family history includes Anxiety disorder in her father and mother; Bipolar disorder in her mother; Breast cancer in her other; Breast cancer (age of onset: 42) in her maternal aunt and maternal aunt; Colon cancer in her cousin and father; Depression in her father, mother, and sister; Diabetes in her father and sister; Heart disease in her mother; Heart disease (age of onset: 49) in her father; Hyperlipidemia in her mother and sister; Hypertension in her father, mother, sister, and sister; Kidney disease in her father and sister; Ovarian cancer in her cousin; Skin cancer in her father; Stroke in her father; Thyroid nodules in her sister.  ROS:   Review of Systems  Constitutional: Positive for malaise/fatigue. Negative for chills, diaphoresis, fever and weight loss.  HENT: Negative for congestion.   Eyes: Negative for discharge and redness.  Respiratory: Positive for shortness of breath. Negative for cough, hemoptysis, sputum production and wheezing.   Cardiovascular: Positive for chest pain. Negative for palpitations, orthopnea, claudication, leg swelling and PND.  Gastrointestinal: Negative for abdominal pain, blood in stool, heartburn, melena, nausea and vomiting.  Genitourinary: Negative for hematuria.  Musculoskeletal: Negative for falls and myalgias.  Skin: Negative for rash.  Neurological:  Positive for weakness. Negative for dizziness, tingling, tremors, sensory change, speech change, focal weakness and loss of consciousness.  Endo/Heme/Allergies: Does not bruise/bleed easily.  Psychiatric/Behavioral: Negative for substance abuse. The patient is nervous/anxious.   All other systems reviewed and are negative.    PHYSICAL EXAM:  VS:  BP 106/68 (BP Location: Left Arm, Patient Position: Sitting, Cuff Size: Normal)   Pulse 64   Ht _0  (1.575 m)   Wt 177 lb 4 oz (80.4 kg)   BMI 32.42 kg/m  BMI: Body mass index is 32.42 kg/m.  Physical Exam  Constitutional: She is oriented to person, place, and time. She appears well-developed and well-nourished.  HENT:  Head: Normocephalic and atraumatic.  Eyes: Right eye exhibits no discharge. Left eye exhibits no discharge.  Neck: Normal range of motion. No JVD present.  Cardiovascular: Normal rate, regular rhythm, S1 normal, S2 normal and normal heart sounds. Exam reveals no distant heart sounds, no friction rub, no midsystolic click and no opening snap.  No murmur heard. Pulses:      Posterior tibial pulses are 2+ on the right side, and 2+ on the left side.  Pulmonary/Chest: Effort normal and breath sounds normal. No respiratory distress. She has no decreased breath sounds. She has no wheezes. She has no rales. She exhibits no tenderness.  Abdominal: Soft. She exhibits no distension. There is no tenderness.  Musculoskeletal: She exhibits no edema.  Neurological: She is alert and oriented to person, place, and time.  Skin: Skin is warm and dry. No cyanosis. Nails show no clubbing.  Psychiatric: She has a normal mood and affect. Her speech is normal and behavior is normal. Judgment and thought content normal.     EKG:  Was ordered and interpreted by me today. Shows NSR, 64 bpm, no acute st/t changes  Recent Labs: 03/19/2017: TSH 1.013 06/18/2017: BNP 317.4 07/09/2017: Magnesium 1.6 09/19/2017: ALT 96 09/21/2017: BUN 16; Creatinine, Ser  1.56; Hemoglobin 11.1; Platelets 131; Potassium 3.9; Sodium 140  09/20/2017: Cholesterol 73; HDL 29; LDL Cholesterol 35; Total CHOL/HDL Ratio 2.5; Triglycerides 45; VLDL 9   Estimated Creatinine Clearance: 43.3 mL/min (A) (by C-G formula based on SCr of 1.56 mg/dL (H)).  Wt Readings from Last 3 Encounters:  09/30/17 177 lb 4 oz (80.4 kg)  09/23/17 179 lb (81.2 kg)  09/21/17 180 lb 8 oz (81.9 kg)     Other studies reviewed: Additional studies/records reviewed today include: summarized above  ASSESSMENT AND PLAN:  1. CAD involving the native coronary arteries with stable angina: Currently, chest pain free.  Lengthy discussion with patient regarding her coronary anatomy and management options.  She does indicate some response to the addition of isosorbide mononitrate, though still has some breakthrough episodes.  We will increase her Imdur to 90 mg daily.  In this setting, we will decrease her losartan to 50 mg daily in an effort to avoid significant hypotension.  We will continue to try to manage her with medications as the second diagonal is less than ideal for stenting given bifurcation with the LAD with previously placed stent.  Continue dual antiplatelet therapy with aspirin and Plavix.  Continue Toprol and Crestor.  No plans for cardiac catheterization at this time.  Of note, patient has previously been on ranolazine and stated this did not help.  2. Essential hypertension: Blood pressure is well controlled today as above. Decrease losartan as above to allow for BP room to escalate Imdur as above. Continue amlodipine 5 mg daily as well as TOprol XL 50 mg daily.   3. Hyperlipidemia: LDL of 35 on 09/20/2017.  LFTs were mildly elevated on 09/19/2017.  Remains on Crestor 10 mg daily.  Schedule repeat liver function testing in 2 weeks.  4. CKD stage III: Renal function at time of discharge on 8/20 of 1.56.  This is relatively stable. She indicates nephrology has check her renal function on 8/26. We  will call for these results.   5. Diabetes type 2: Managed by PCP.  Disposition: F/u with Dr. [name] or an APP in 2 weeks.   Current medicines are reviewed at length with the patient today.  The patient did not have any concerns regarding medicines.  Signed, Christell Faith, PA-C 09/30/2017 2:06 PM     [place] [address] [place] [place], [place] [address] [phone]

## 2017-09-30 NOTE — Patient Instructions (Signed)
Medication Instructions: - Your physician has recommended you make the following change in your medication:   1) INCREASE imdur (isosorbide) to 30 mg- take 3 tablets (90 mg) by mouth once daily 2) DECREASE cozaar (losartan) to 50 mg - take 1 tablet by mouth once daily  Labwork: - Your physician recommends that you return for lab work in: 2 weeks- Liver panel (we can draw labs the day of your appointment)  Procedures/Testing: - none ordered  Follow-Up: - Your physician recommends that you schedule a follow-up appointment in: 2 weeks    Any Additional Special Instructions Will Be Listed Below (If Applicable).     If you need a refill on your cardiac medications before your next appointment, please call your pharmacy.

## 2017-10-07 DIAGNOSIS — D509 Iron deficiency anemia, unspecified: Secondary | ICD-10-CM | POA: Diagnosis not present

## 2017-10-07 DIAGNOSIS — E7800 Pure hypercholesterolemia, unspecified: Secondary | ICD-10-CM | POA: Diagnosis not present

## 2017-10-07 DIAGNOSIS — N183 Chronic kidney disease, stage 3 (moderate): Secondary | ICD-10-CM | POA: Diagnosis not present

## 2017-10-07 DIAGNOSIS — I10 Essential (primary) hypertension: Secondary | ICD-10-CM | POA: Diagnosis not present

## 2017-10-10 IMAGING — CR DG CHEST 2V
1 series · 2 of 2 positions shown · non-contrast
Comparison: CT 05/31/2015.

CLINICAL DATA: Chest pain.

EXAM:
CHEST  2 VIEW

[Series 1: dg chest 2 view · 0.14mm/px · 2 of 2 slices shown]
[im 1/2]
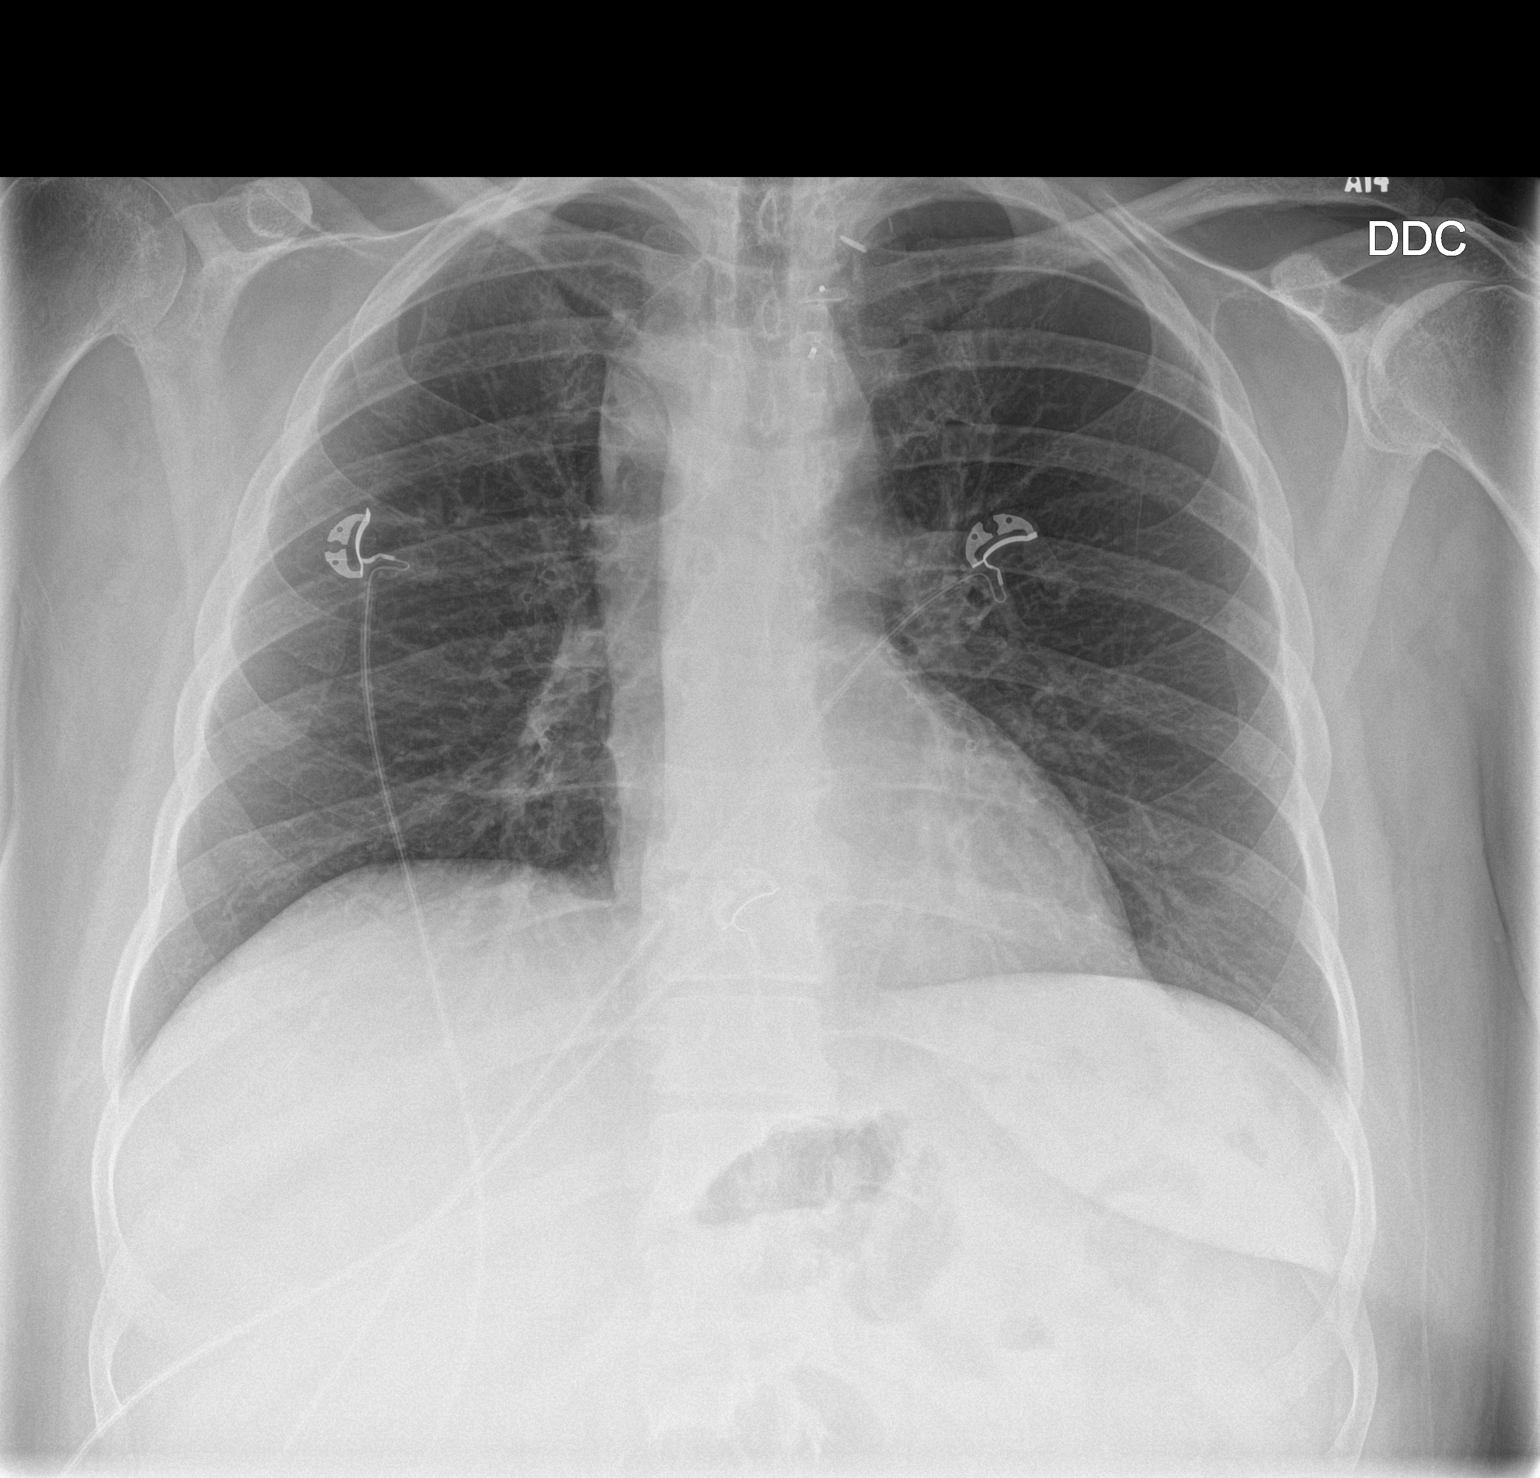
[im 2/2]
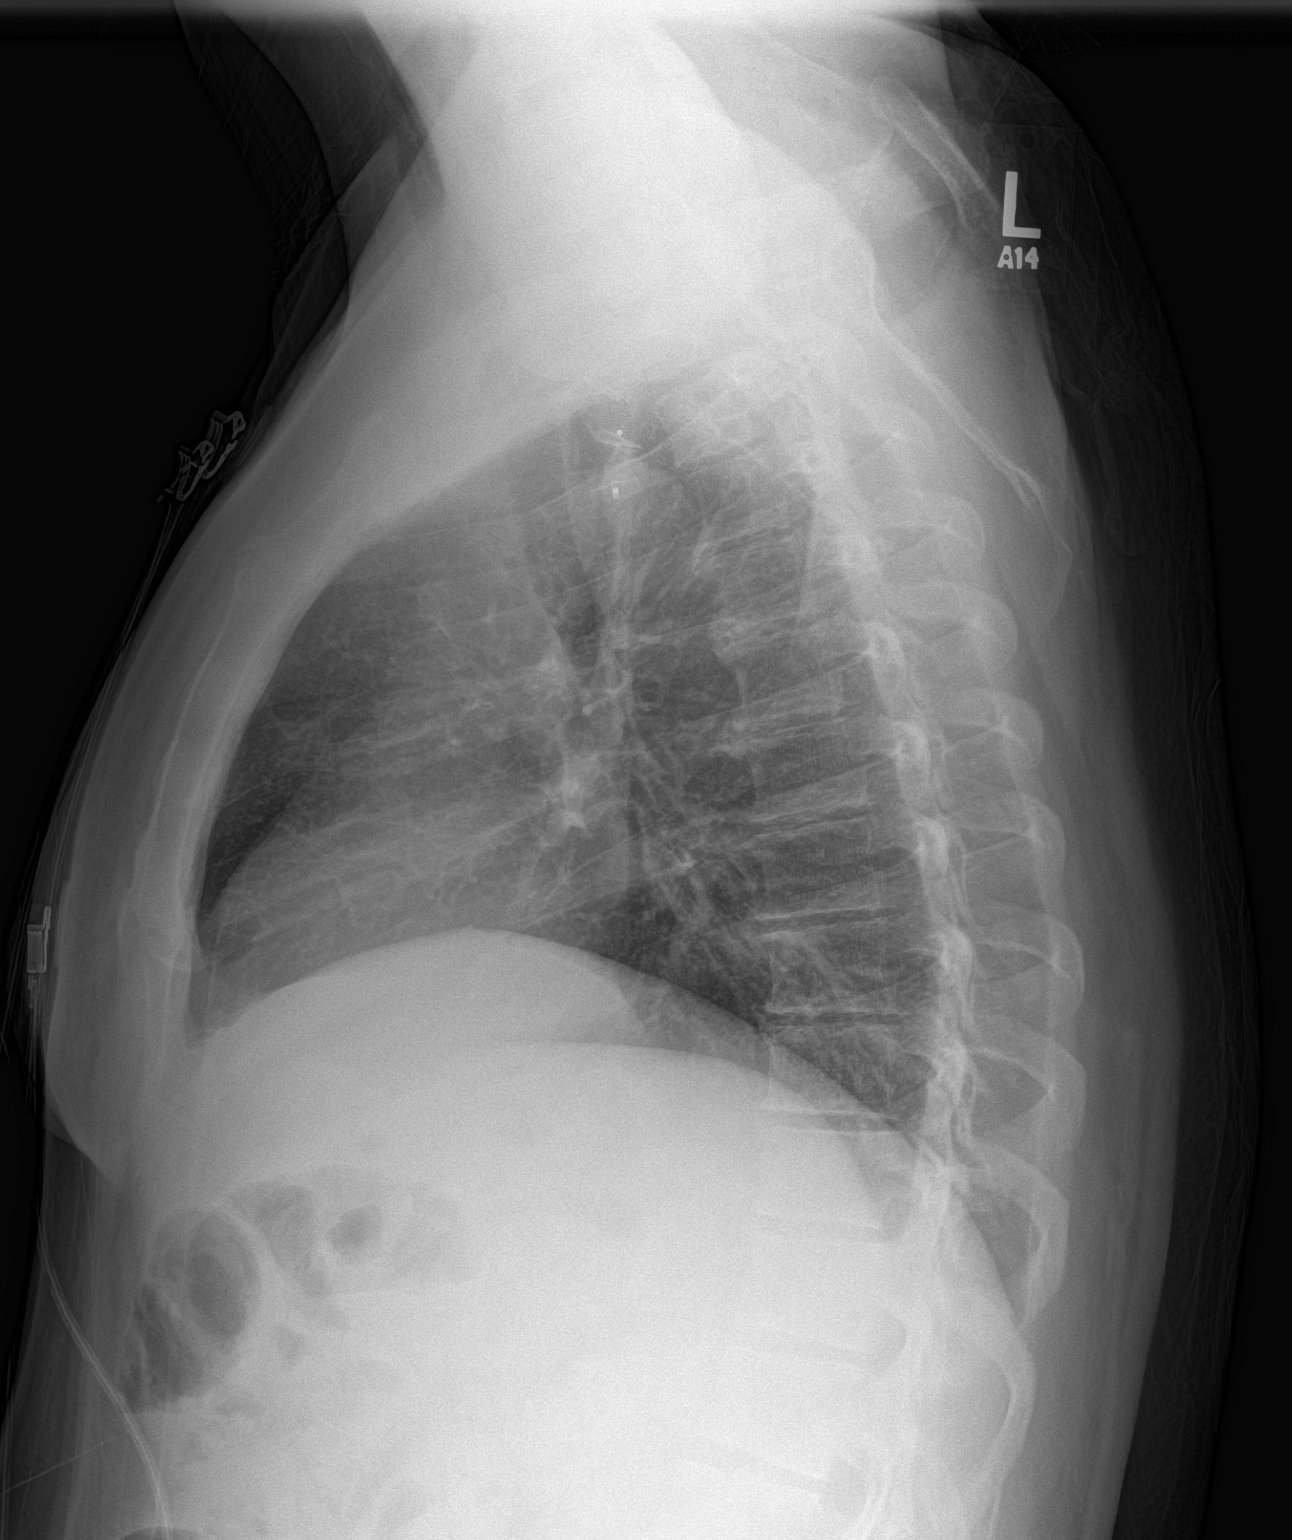

[2 of 2 positions shown; findings below may reference images not displayed]

FINDINGS: Surgical clips upper chest. Right-sided aortic arch is noted. Heart
size normal. Lungs are clear. No pleural effusion or pneumothorax.
Degenerative changes thoracic spine.
IMPRESSION: No acute cardiopulmonary disease. Right-sided aortic arch noted.
Heart size normal.

## 2017-10-12 NOTE — Progress Notes (Signed)
Cardiology Office Note Date:  10/13/2017  Patient ID:  [name], [name] 1970-11-15, MRN [id number] PCP:  [name], MD  Cardiologist:  Dr. [name], MD    Chief Complaint: Follow up  History of Present Illness: [name] is a 47 y.o. female with history of CAD status post prior interventions, hypertension, hyperlipidemia, type 2 diabetes mellitus, stage III chronic kidney disease, prior strokes, obesity status post gastric bypass, prior left carotid to subclavian artery bypass with subclavian artery ligation, anemia, bipolar disorder, and aortic arch aneurysm who presents for follow up of her CAD.  She was initially diagnosed with CAD in 08/2003, at which time catheterization revealed moderate LAD stenosis, which was medically managed.More recently, in 11/2014, she required PCI and drug-eluting stent placement to her mid LAD. In 06/2015 repeat LHC revealed patency of the LAD stent but she had developed significant first diagonal disease and this was successfully treated with a drug-eluting stent. She had recurrent angina in 06/2017 and catheterization at that time showed severe stenosis in the second diagonal and this was treated with balloon angioplasty given vessel size and angulation.She did well for a few months but began to experience recurrent chest discomfort in late 08/2017 and was subsequently admitted to [facility]. She ruled out and underwent diagnostic LHC that showed restenosis within the second diagonal. This was treated with cutting balloon angioplasty as this vessel was again not felt to be optimal for stenting due to angulation and bifurcation with the LAD. In hospital follow up (as a work in) on 8/22, she was doing relatively well. She did have an episode of chest pain with associated SOB while walking to get the mail that improved with rest prompting her to contact our office and be worked in. She noted some dyspnea while sweeping the floor prior to coming to her  appointment on 8/22. Vitals were stable. EKG was not acute. She was chest pain free at the time of her visit. She was started on Imdur 30 mg (previously on this medication though stopped secondary to headache) which was subsequently escalated to 60 mg daily. She reported that Ranexa did not help.  She was most recently seen in the office on 09/30/2017.  At that time, she noted the Imdur was helping some.  She did note to breakthrough episodes of angina, both occurring at rest and both responded to sublingual nitroglycerin x1.  This pain was unchanged from her baseline.  Her Imdur was titrated to 90 mg daily.  In that setting, we decreased her losartan to 50 mg daily in an effort to avoid hypotension.  Continued medical management of her coronary artery disease was recommended given the lesion in the second diagonal was less than ideal for stenting given bifurcation with the LAD with the previously placed stent.  She comes in today noting some improvement and her angina following escalation of Imdur to 90 mg daily.  She reports having had 7 brief episodes of chest pain occurring at rest since she was last seen.  However, each of these episodes was shorter in duration and less severe than her prior episodes.  They did not require her to take any sublingual nitroglycerin.  There were no associated symptoms.  Pain felt similar to her prior episodes though was shorter in duration and less severe as noted above.  She is currently chest pain-free.  Unfortunately, on 9/8, the patient stood up after using the restroom and felt dizzy leading to a fall/syncopal episode.  She hit the back of her head on the side of the bathtub.  Patient's son heard the fall and got her up off of the floor from the bathroom.  She did not seek medical attention at that time.  No visual changes.  She continues to have a mild headache.  She indicates she does not need to go to the ED at this time.  She is currently babysitting her grandchild and  is unable to proceed with outpatient radiologic evaluation.  No BRBPR or melena.  She did self discontinue her hydralazine given her relative hypotension and syncope.  Past Medical History:  Diagnosis Date  . Anemia    iron deficiency anemia  . Aortic arch aneurysm ([place])   . Bipolar disorder ([place])   . BRCA negative 2014  . CAD (coronary artery disease)    a. 08/2003 Cath: LAD 30-40-med Rx; b. 11/2014 PCI: LAD 75m(3.25x23 Xience Alpine DES); c. 06/2015 PCI: D1 (2.25x12 Resolute Integrity DES); d. 06/2017 PCI: Patent mLAD stent, D2 95 (PTCA); e. 09/2017 Cath/PCI: LM nl, LAD 364m70d, D1 40, D2 99ost (CBA), LCX 40p, RCA 40ost/p.  . Marland KitchenKD (chronic kidney disease), stage III (HCL[place]ard  . Colon polyp   . CVA (cerebral vascular accident) ([place]   Left side weakness.   . Diabetes ([place]  . Family history of breast cancer    BRCA neg 2014  . Gastric ulcer 04/27/2011  . History of echocardiogram    a. 03/2017 Echo: EF 60-65%, no rwma.  . Marland KitchenTN (hypertension)   . Hyperlipemia   . Hypothyroid   . Malignant melanoma of skin of scalp ([facility]  . MI, acute, non ST segment elevation ([facility]  . Neuromuscular disorder ([place]  . S/P drug eluting coronary stent placement 06/04/2015  . Sepsis (HCOlin2/14/2019    Past Surgical History:  Procedure Laterality Date  . APPENDECTOMY    . CARDIAC CATHETERIZATION N/A 11/09/2014   Procedure: Coronary Angiography;  Surgeon: TiMinna MerrittsMD;  [place];  Service: Cardiovascular;  Laterality: N/A;  . CARDIAC CATHETERIZATION N/A 11/12/2014   Procedure: Coronary Stent Intervention;  Surgeon: AlIsaias CowmanMD;  [place] [facility];  Service: Cardiovascular;  Laterality: N/A;  . CARDIAC CATHETERIZATION N/A 04/18/2015   Procedure: Left Heart Cath and Coronary Angiography;  Surgeon: TiMinna MerrittsMD;  [place] [facility];  Service: Cardiovascular;  Laterality: N/A;  . CARDIAC CATHETERIZATION Left 06/04/2015   Procedure: Left  Heart Cath and Coronary Angiography;  Surgeon: MuWellington HampshireMD;  [place] [facility];  Service: Cardiovascular;  Laterality: Left;  . CARDIAC CATHETERIZATION N/A 06/04/2015   Procedure: Coronary Stent Intervention;  Surgeon: MuWellington HampshireMD;  [place] [facility];  Service: Cardiovascular;  Laterality: N/A;  . CESAREAN SECTION  2001  . CHOLECYSTECTOMY N/A 11/18/2016   Procedure: LAPAROSCOPIC CHOLECYSTECTOMY WITH INTRAOPERATIVE CHOLANGIOGRAM;  Surgeon: SaChristene LyeMD;  [facility];  Service: General;  Laterality: N/A;  . COLONOSCOPY WITH PROPOFOL N/A 04/27/2016   Procedure: COLONOSCOPY WITH PROPOFOL;  Surgeon: DaLucilla LameMD;  [place] Service: Endoscopy;  Laterality: N/A;  . CORONARY ANGIOPLASTY    . CORONARY BALLOON ANGIOPLASTY N/A 06/29/2017   Procedure: CORONARY BALLOON ANGIOPLASTY;  Surgeon: ArWellington HampshireMD;  [place];  Service: Cardiovascular;  Laterality: N/A;  . CORONARY BALLOON ANGIOPLASTY N/A 09/20/2017   Procedure: CORONARY BALLOON ANGIOPLASTY;  Surgeon:  Wellington Hampshire, MD;  Location: [place] CV LAB;  Service: Cardiovascular;  Laterality: N/A;  . DILATION AND CURETTAGE OF UTERUS    . ESOPHAGOGASTRODUODENOSCOPY (EGD) WITH PROPOFOL N/A 09/14/2014   Procedure: ESOPHAGOGASTRODUODENOSCOPY (EGD) WITH PROPOFOL;  Surgeon: Josefine Class, MD;  Location: [facility] ENDOSCOPY;  Service: Endoscopy;  Laterality: N/A;  . ESOPHAGOGASTRODUODENOSCOPY (EGD) WITH PROPOFOL N/A 04/27/2016   Procedure: ESOPHAGOGASTRODUODENOSCOPY (EGD) WITH PROPOFOL;  Surgeon: [name], MD;  Location: [place];  Service: Endoscopy;  Laterality: N/A;  Diabetic - oral meds  . GASTRIC BYPASS  09/2009   [facility]   . Left Carotid to sublcavian artery bypass w/ subclavian artery ligation     a. Performed @ [place].  . LEFT HEART CATH AND CORONARY ANGIOGRAPHY Left 06/29/2017   Procedure: LEFT  HEART CATH AND CORONARY ANGIOGRAPHY;  Surgeon: Wellington Hampshire, MD;  [place] LAB;  Service: Cardiovascular;  Laterality: Left;  . LEFT HEART CATH AND CORONARY ANGIOGRAPHY N/A 09/20/2017   Procedure: LEFT HEART CATH AND CORONARY ANGIOGRAPHY;  Surgeon: Wellington Hampshire, MD;  Location: [place] CV LAB;  Service: Cardiovascular;  Laterality: N/A;  . MELANOMA EXCISION  2016   Dr. Evorn Gong  . [place]  2002  . RIGHT OOPHORECTOMY    . SHOULDER ARTHROSCOPY WITH OPEN ROTATOR CUFF REPAIR Right 01/07/2016   Procedure: SHOULDER ARTHROSCOPY WITH DEBRIDMENT, SUBACHROMIAL DECOMPRESSION;  Surgeon: Corky Mull, MD;  Location: [facility] ORS;  Service: Orthopedics;  Laterality: Right;  . SHOULDER ARTHROSCOPY WITH OPEN ROTATOR CUFF REPAIR Right 03/16/2017   Procedure: SHOULDER ARTHROSCOPY WITH OPEN ROTATOR CUFF REPAIR POSSIBLE BICEPS TENODESIS;  Surgeon: Corky Mull, MD;  Location: [facility];  Service: Orthopedics;  Laterality: Right;  . TRIGGER FINGER RELEASE Right     Middle Finger    Current Meds  Medication Sig  . ALPRAZolam (XANAX) 1 MG tablet Take 1 tablet (1 mg total) by mouth daily as needed for anxiety.  Marland Kitchen amLODipine (NORVASC) 5 MG tablet Take 1 tablet (5 mg total) by mouth daily.  Marland Kitchen aspirin 81 MG chewable tablet Chew 1 tablet (81 mg total) by mouth daily.  . Blood Glucose Monitoring Suppl (ONE TOUCH ULTRA 2) w/Device KIT Use to check blood sugar once a day. Dx. E11.9  . buPROPion (WELLBUTRIN XL) 150 MG 24 hr tablet TAKE 1 TABLET BY MOUTH DAILY  . Calcium Carbonate-Vitamin D (CALCIUM 500/D PO) Take 500 mg by mouth daily.   . celecoxib (CELEBREX) 200 MG capsule Take 200 mg by mouth 2 (two) times daily as needed for moderate pain.   . citalopram (CELEXA) 40 MG tablet Take 1 tablet (40 mg total) by mouth daily.  . clopidogrel (PLAVIX) 75 MG tablet Take 1 tablet (75 mg total) by mouth daily with breakfast.  . Cyanocobalamin (VITAMIN B 12 PO) Take 1,000 mcg by mouth daily.   Marland Kitchen  gabapentin (NEURONTIN) 300 MG capsule Take 1 capsule (300 mg total) by mouth 2 (two) times daily.  . hyoscyamine (LEVSIN SL) 0.125 MG SL tablet Place 1 tablet (0.125 mg total) under the tongue every 6 (six) hours as needed. (Patient taking differently: Place 0.125 mg under the tongue every 6 (six) hours as needed for cramping. )  . isosorbide mononitrate (IMDUR) 30 MG 24 hr tablet Take 3 tablets (90 mg) by mouth once daily  . levothyroxine (SYNTHROID, LEVOTHROID) 25 MCG tablet Take 1 tablet (25 mcg total) daily before breakfast by mouth.  . losartan (COZAAR) 50 MG tablet  Take 1 tablet (50 mg total) by mouth daily.  . magnesium oxide (MAG-OX) 400 MG tablet Take 1 tablet (400 mg total) by mouth daily.  . metoprolol succinate (TOPROL-XL) 50 MG 24 hr tablet Take 1 tablet (50 mg total) by mouth daily. Take with or immediately following a meal.  . Multiple Vitamin (MULTIVITAMIN WITH MINERALS) TABS tablet Take 1 tablet by mouth daily.  . nitroGLYCERIN (NITROSTAT) 0.4 MG SL tablet Place 1 tablet (0.4 mg total) under the tongue every 5 (five) minutes as needed for chest pain.  . pantoprazole (PROTONIX) 40 MG tablet Take 1 tablet (40 mg total) by mouth 2 (two) times daily.  . pioglitazone (ACTOS) 15 MG tablet Take 15 mg by mouth daily.   . promethazine (PHENERGAN) 25 MG tablet Take 25 mg by mouth every 6 (six) hours as needed for nausea or vomiting.   . pyridostigmine (MESTINON) 60 MG tablet Take 60 mg by mouth 3 (three) times daily.   . rosuvastatin (CRESTOR) 10 MG tablet Take 1 tablet (10 mg total) by mouth daily.    Allergies:   Lipitor [atorvastatin] and Tramadol   Social History:  The patient  reports that she quit smoking about 23 years ago. Her smoking use included cigarettes. She has never used smokeless tobacco. She reports that she does not drink alcohol or use drugs.   Family History:  The patient's family history includes Anxiety disorder in her father and mother; Bipolar disorder in her  mother; Breast cancer in her other; Breast cancer (age of onset: 47) in her maternal aunt and maternal aunt; Colon cancer in her cousin and father; Depression in her father, mother, and sister; Diabetes in her father and sister; Heart disease in her mother; Heart disease (age of onset: 43) in her father; Hyperlipidemia in her mother and sister; Hypertension in her father, mother, sister, and sister; Kidney disease in her father and sister; Ovarian cancer in her cousin; Skin cancer in her father; Stroke in her father; Thyroid nodules in her sister.  ROS:   Review of Systems  Constitutional: Positive for malaise/fatigue. Negative for chills, diaphoresis, fever and weight loss.  HENT: Negative for congestion.   Eyes: Negative for discharge and redness.  Respiratory: Negative for cough, hemoptysis, sputum production, shortness of breath and wheezing.   Cardiovascular: Positive for chest pain. Negative for palpitations, orthopnea, claudication, leg swelling and PND.  Gastrointestinal: Negative for abdominal pain, blood in stool, heartburn, melena, nausea and vomiting.  Genitourinary: Negative for hematuria.  Musculoskeletal: Positive for falls. Negative for myalgias.  Skin: Negative for rash.  Neurological: Positive for dizziness, loss of consciousness, weakness and headaches. Negative for tingling, tremors, sensory change, speech change, focal weakness and seizures.  Endo/Heme/Allergies: Does not bruise/bleed easily.  Psychiatric/Behavioral: Negative for substance abuse. The patient is nervous/anxious.   All other systems reviewed and are negative.    PHYSICAL EXAM:  VS:  BP 122/77 (BP Location: Right Arm, Patient Position: Sitting, Cuff Size: Normal)   Pulse 70   Ht '5\' 2"'  (1.575 m)   Wt 177 lb 8 oz (80.5 kg)   BMI 32.47 kg/m  BMI: Body mass index is 32.47 kg/m.  Physical Exam  Constitutional: She is oriented to person, place, and time. She appears well-developed and well-nourished.  HENT:    Head: Normocephalic and atraumatic.  Eyes: Right eye exhibits no discharge. Left eye exhibits no discharge.  Neck: Normal range of motion. No JVD present.  Cardiovascular: Normal rate, regular rhythm, S1 normal, S2 normal and  normal heart sounds. Exam reveals no distant heart sounds, no friction rub, no midsystolic click and no opening snap.  No murmur heard. Pulses:      Dorsalis pedis pulses are 2+ on the right side, and 2+ on the left side.       Posterior tibial pulses are 2+ on the right side, and 2+ on the left side.  Pulmonary/Chest: Effort normal and breath sounds normal. No respiratory distress. She has no decreased breath sounds. She has no wheezes. She has no rales. She exhibits no tenderness.  Abdominal: Soft. She exhibits no distension. There is no tenderness.  Musculoskeletal: She exhibits no edema.  Neurological: She is alert and oriented to person, place, and time.  Skin: Skin is warm and dry. No cyanosis. Nails show no clubbing.  Psychiatric: She has a normal mood and affect. Her speech is normal and behavior is normal. Judgment and thought content normal.     EKG:  Was ordered and interpreted by me today. Shows NSR, 70 bpm, LVH, nonspecific ST-T changes  Recent Labs: 03/19/2017: TSH 1.013 06/18/2017: BNP 317.4 07/09/2017: Magnesium 1.6 09/19/2017: ALT 96 09/21/2017: BUN 16; Creatinine, Ser 1.56; Hemoglobin 11.1; Platelets 131; Potassium 3.9; Sodium 140  09/20/2017: Cholesterol 73; HDL 29; LDL Cholesterol 35; Total CHOL/HDL Ratio 2.5; Triglycerides 45; VLDL 9   CrCl cannot be calculated (Patient's most recent lab result is older than the maximum 21 days allowed.).   Wt Readings from Last 3 Encounters:  10/13/17 177 lb 8 oz (80.5 kg)  09/30/17 177 lb 4 oz (80.4 kg)  09/23/17 179 lb (81.2 kg)     Orthostatic Vital Signs: Lying: 112/75, 69 bpm Sitting: 108/72, 74 bpm Standing: 105/72, 81 bpm Standing x 3 minutes: 105/71, 80 bpm  Other studies reviewed: Additional  studies/records reviewed today include: summarized above  ASSESSMENT AND PLAN:  1. CAD involving the native coronary arteries with stable angina: Currently chest pain-free.  Anginal symptoms continue to improve with escalation of Imdur.  This supports microvascular disease is predominantly playing a role and her symptomology.  Increase isosorbide mononitrate to 120 mg daily.  In this setting, we will decrease her losartan to 12.5 mg daily in an effort to prevent significant hypotension.  We will continue Toprol-XL and Norvasc for antianginal therapy.  Continue dual antiplatelet therapy with aspirin 81 mg daily and Plavix 75 mg daily.  As noted above, PCI is not favored for her second diagonal coronary artery disease given location and angulation with the LAD/mid LAD stent.  We will continue to optimize her medical therapy as we seem to be making good progress.  We will reserve potential high risk PCI/DES for refractory angina.  No plans for repeat cardiac catheterization at this time.  Of note, the patient has previously been on ranolazine and stated this was not effective for her.  2. Syncope: Likely in the setting of orthostasis/dysautonomic in the setting of prior gastric bypass surgery and diabetic neuropathy, following restroom usage.  She continues to note daily diarrhea which has been present for several months.  She has not yet seen her PCP about this.  Suspect this may be playing a role in some of her relative hypotension and dizziness.  Offered patient ED evaluation for her syncope today which she declines.  Offered patient head CT today which she declines given she is babysitting her grandchild.  We have agreed to proceed with a head CT on 9/12.  Numerous cardiac monitors have been placed on the patient and been  unrevealing for significant cardiac arrhythmia or prolonged pauses.  3. Essential hypertension: Blood pressure is well controlled today.  Orthostatic vital signs are negative.  She  indicates relative hypotension at home.  She has self discontinued hydralazine.  We will decrease losartan to 12.5 mg daily in an effort to prevent orthostasis.  In this setting, we have escalated her isosorbide mononitrate to 120 mg daily as detailed above.  Continue Norvasc 5 mg daily as well as Toprol-XL 50 mg daily.  4. Hyperlipidemia: LDL of 35 on 09/20/2017.  Remains on Crestor 10 mg daily.  Repeat liver testing is pending at this time.  5. CKD stage III: Followed by nephrology.  She indicates nephrology has recently checked her renal function and this was stable.  We will attempt to get these results.  Disposition: F/u with Dr. [name], MD or an APP in 3 months, sooner if needed.  Current medicines are reviewed at length with the patient today.  The patient did not have any concerns regarding medicines.  Melvern Banker PA-C 10/13/2017 2:43 PM     [address] [place], [place] [address] [phone]

## 2017-10-13 ENCOUNTER — Encounter: Payer: Self-pay | Admitting: Physician Assistant

## 2017-10-13 ENCOUNTER — Ambulatory Visit (INDEPENDENT_AMBULATORY_CARE_PROVIDER_SITE_OTHER): Payer: Medicare Other | Admitting: Physician Assistant

## 2017-10-13 VITALS — BP 122/77 | HR 70 | Ht 62.0 in | Wt 177.5 lb

## 2017-10-13 DIAGNOSIS — E785 Hyperlipidemia, unspecified: Secondary | ICD-10-CM | POA: Diagnosis not present

## 2017-10-13 DIAGNOSIS — I200 Unstable angina: Secondary | ICD-10-CM

## 2017-10-13 DIAGNOSIS — I10 Essential (primary) hypertension: Secondary | ICD-10-CM

## 2017-10-13 DIAGNOSIS — I25118 Atherosclerotic heart disease of native coronary artery with other forms of angina pectoris: Secondary | ICD-10-CM

## 2017-10-13 DIAGNOSIS — G44309 Post-traumatic headache, unspecified, not intractable: Secondary | ICD-10-CM

## 2017-10-13 DIAGNOSIS — N183 Chronic kidney disease, stage 3 unspecified: Secondary | ICD-10-CM

## 2017-10-13 DIAGNOSIS — R55 Syncope and collapse: Secondary | ICD-10-CM | POA: Diagnosis not present

## 2017-10-13 IMAGING — CT CT ABD-PELV W/O CM
2 of 4 series · 16 of 46 positions shown, 18 images · non-contrast
Comparison: 10/29/2012

CLINICAL DATA: Left-sided flank pain since [REDACTED].

EXAM:
CT ABDOMEN AND PELVIS WITHOUT CONTRAST
TECHNIQUE: Multidetector CT imaging of the abdomen and pelvis was performed
following the standard protocol without IV contrast.

[Series 2: axial st · axial · 0.82mm/px · z∈[-1001,-511]mm · 13 of 108 slices shown, 15 images]
[im 5/108  soft-tissue]
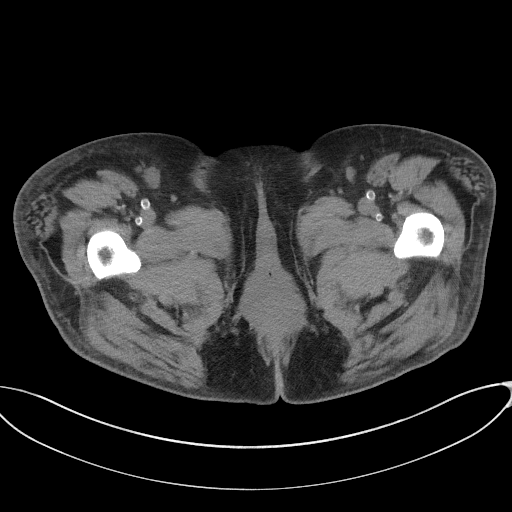
[im 5/108  bone]
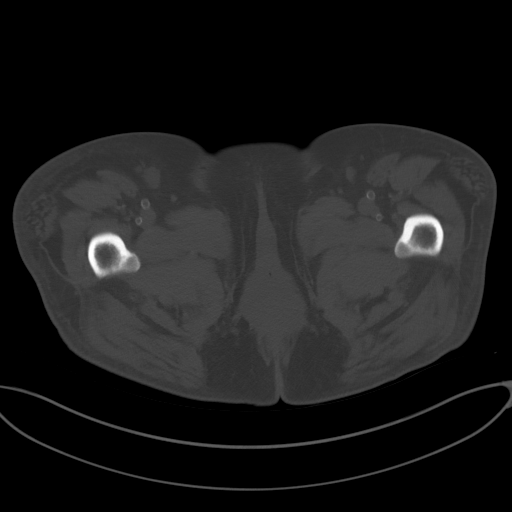
[im 14/108  soft-tissue]
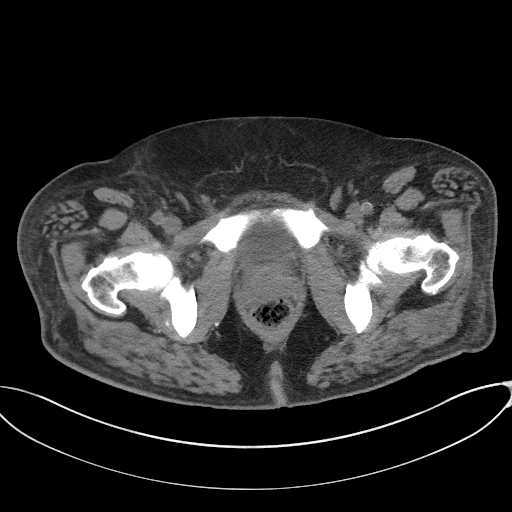
[im 23/108  soft-tissue]
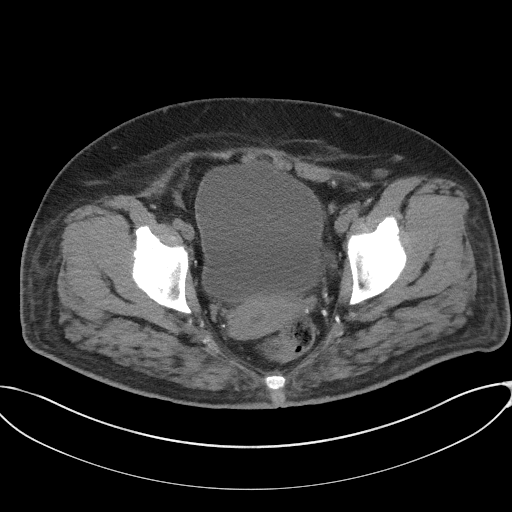
[im 32/108  soft-tissue]
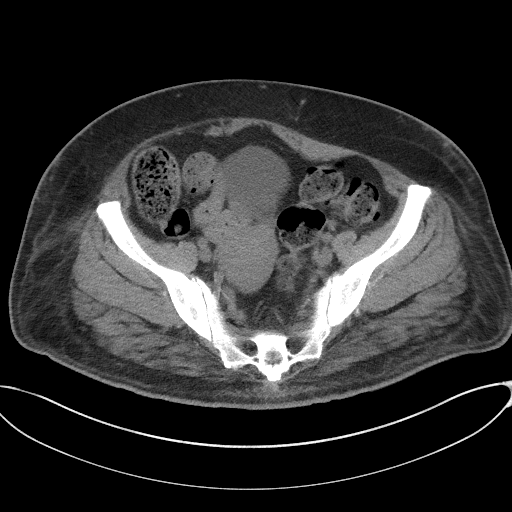
[im 36/108  soft-tissue]
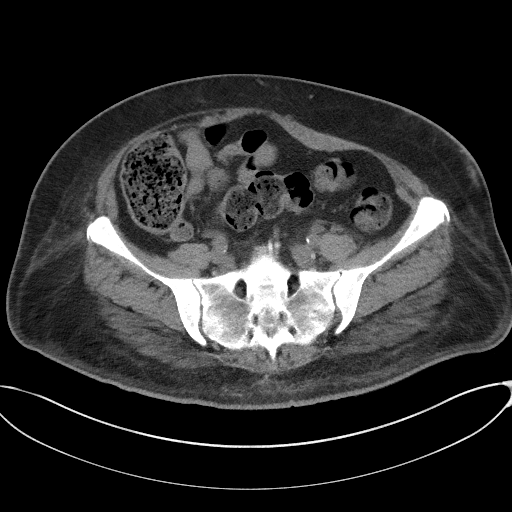
[im 45/108  soft-tissue]
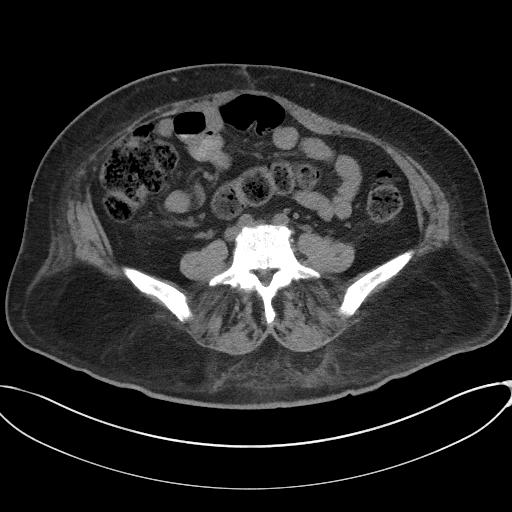
[im 54/108  soft-tissue]
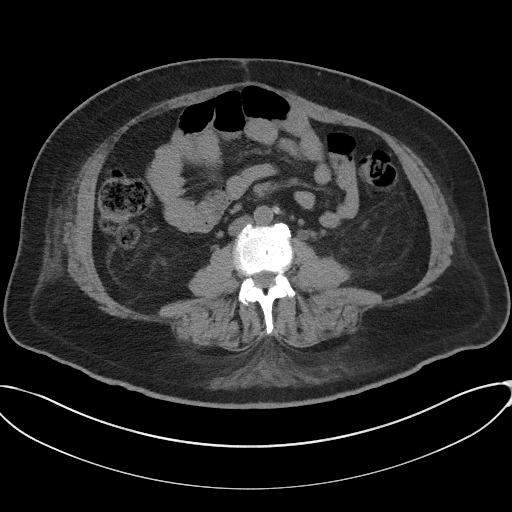
[im 63/108  soft-tissue]
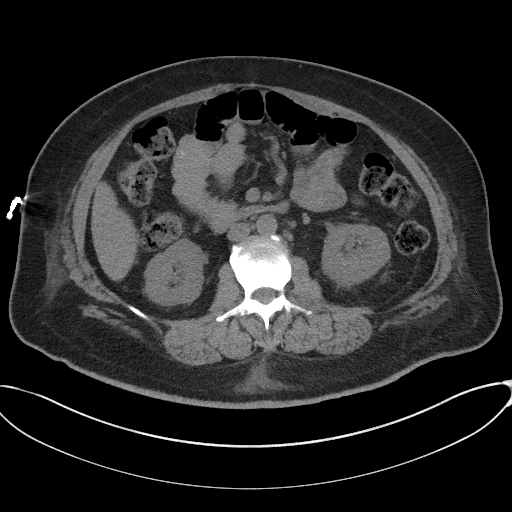
[im 72/108  soft-tissue]
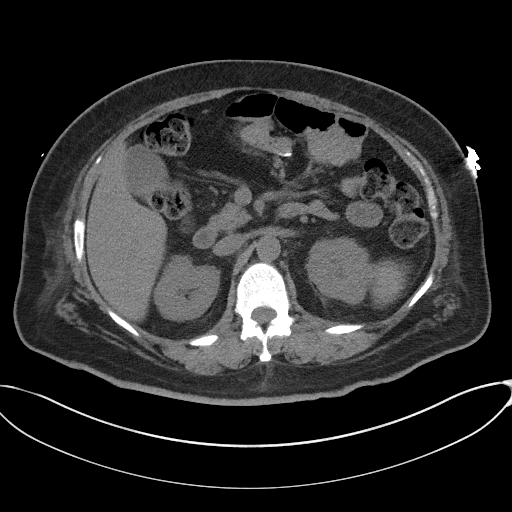
[im 72/108  bone]
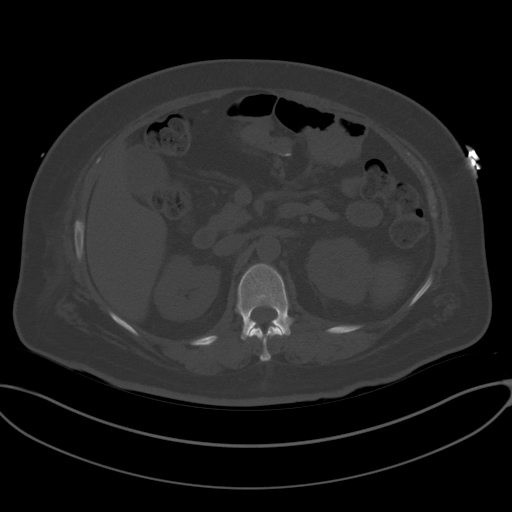
[im 76/108  soft-tissue]
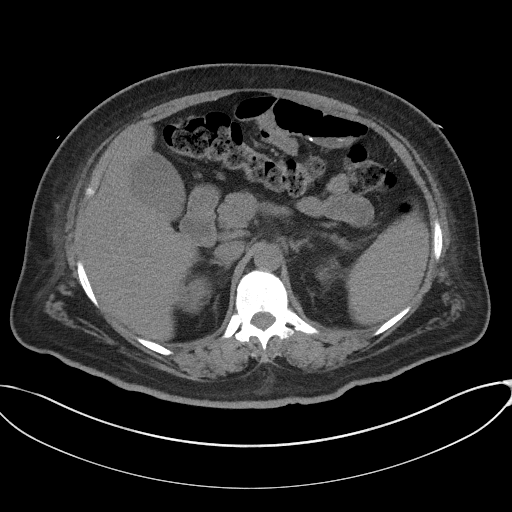
[im 85/108  soft-tissue]
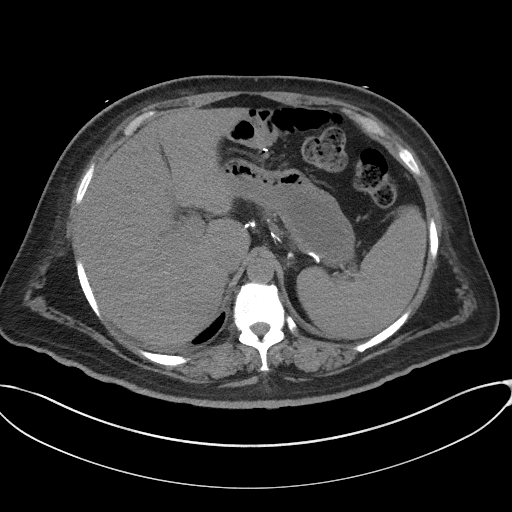
[im 94/108  soft-tissue]
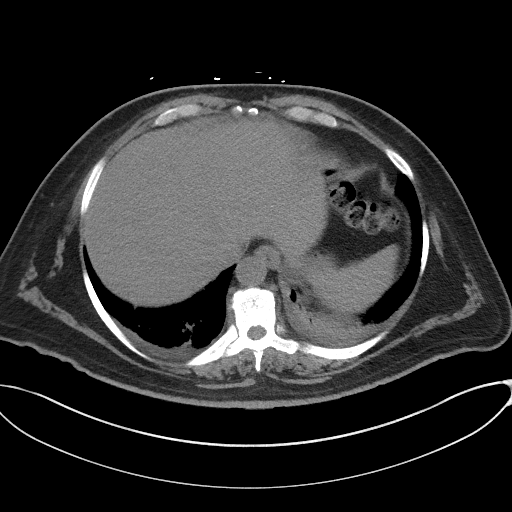
[im 103/108  soft-tissue]
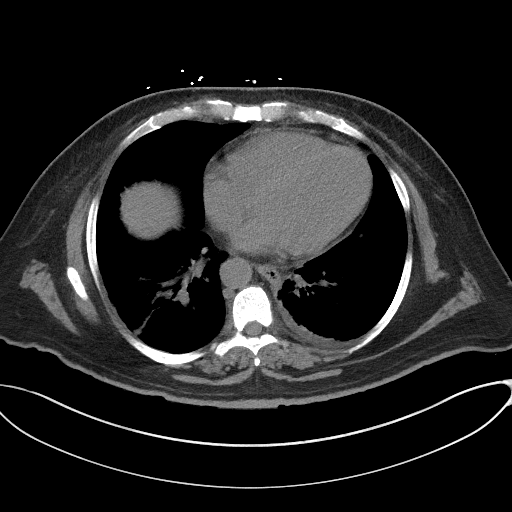

[Series 5: coronal st · coronal · 0.79mm/px · 3 of 95 slices shown]
[im 32/95  soft-tissue]
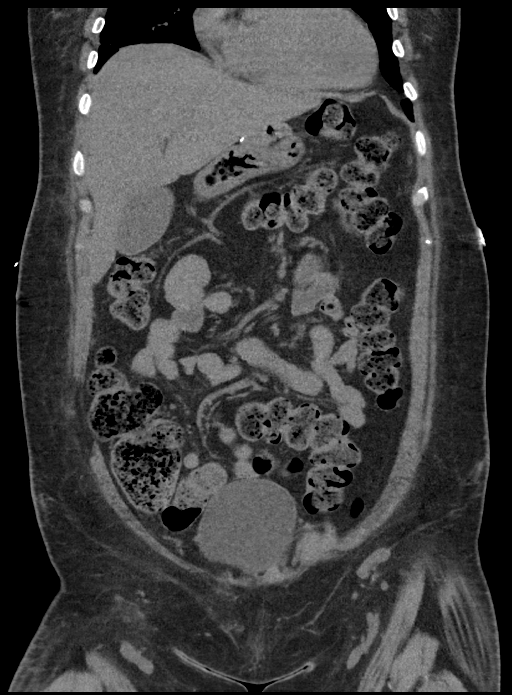
[im 42/95  soft-tissue]
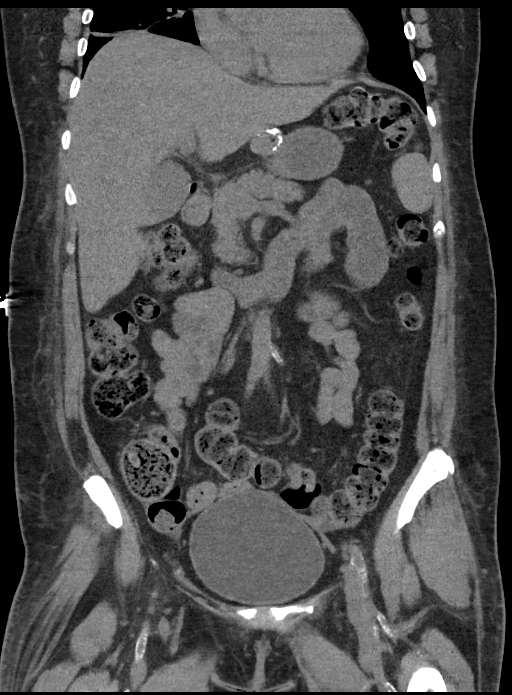
[im 53/95  soft-tissue]
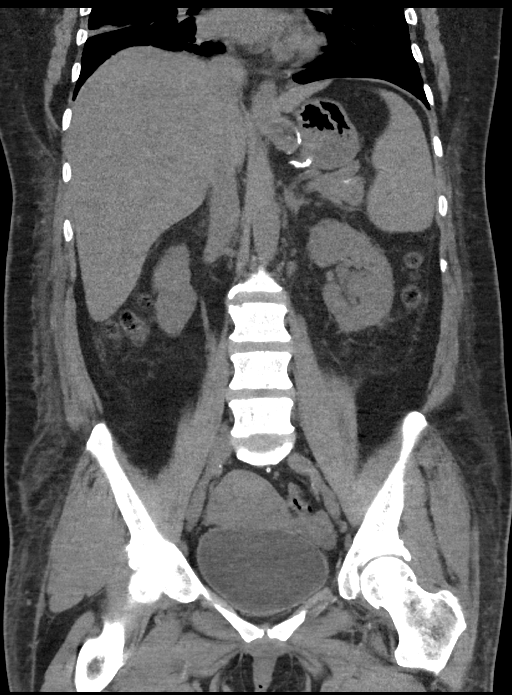

[16 of 46 positions shown; findings below may reference images not displayed]

FINDINGS: Lower chest: Tortuous aorta. Small pleural effusions mild basilar
atelectasis. There is body wall edema but no interstitial thickening
to suggest pulmonary edema.

Hepatobiliary: No focal liver abnormality.No evidence of biliary
obstruction or stone.

Pancreas: Unremarkable.

Spleen: Stable generous size without focal abnormality.

Adrenals/Urinary Tract: Negative adrenals. Mild left hydroureter
without visible obstruction. Perinephric stranding is symmetric.
Left hilar calcifications are likely vascular. Unremarkable bladder.

Stomach/Bowel: Status post gastric bypass. No bowel obstruction.
Formed stool throughout the colon. Appendectomy.

Vascular/Lymphatic: Diffuse arterial calcification. Chronic reactive
appearing enlargement of deep liver drainage lymph nodes.

Reproductive:Negative for age.

Other: No ascites or pneumoperitoneum.

Musculoskeletal: No acute abnormalities. Spondylosis and chronic
endplate spurs.
IMPRESSION: 1. Mild left hydroureter without visible stone or other obstructive
process. Correlate for signs of ascending infection.
2. Trace pleural effusions and mild basilar atelectasis.
3. Possible constipation.

## 2017-10-13 MED ORDER — ISOSORBIDE MONONITRATE ER 120 MG PO TB24: 120.0000 mg | ORAL_TABLET | Freq: Every day | ORAL | 1 refills | Status: DC

## 2017-10-13 MED ORDER — LOSARTAN POTASSIUM 25 MG PO TABS: ORAL_TABLET | ORAL | 1 refills | Status: DC

## 2017-10-13 NOTE — Patient Instructions (Addendum)
Medication Instructions: - Your physician has recommended you make the following change in your medication:   1) DECREASE cozaar (losartan) 25 mg- take 1/2 tablet (12.5 mg) by mouth once daily 2) INCREASE imdur (isosorbide MN) 120 mg- take 1 tablet by mouth once daily  Labwork: - none ordered  Procedures/Testing: - Your physician has recommended that you have a non-contrast head CT tomorrow.  - Please arrive at 11:15 am on Thursday 10/14/17 at the [facility] outpatient imaging center on [address] may eat, drink, and take any medications  Follow-Up: - Your physician recommends that you schedule a follow-up appointment in: 2-3 months with Dr. Louisa Second, PA/ Gerald Stabs, NP   Any Additional Special Instructions Will Be Listed Below (If Applicable).     If you need a refill on your cardiac medications before your next appointment, please call your pharmacy.

## 2017-10-14 ENCOUNTER — Ambulatory Visit
Admission: RE | Admit: 2017-10-14 | Discharge: 2017-10-14 | Disposition: A | Discharge status: Home / Self Care | Payer: Medicare Other | Source: Ambulatory Visit | Attending: Physician Assistant | Admitting: Physician Assistant

## 2017-10-14 DIAGNOSIS — I739 Peripheral vascular disease, unspecified: Secondary | ICD-10-CM | POA: Diagnosis not present

## 2017-10-14 DIAGNOSIS — G44309 Post-traumatic headache, unspecified, not intractable: Secondary | ICD-10-CM | POA: Diagnosis not present

## 2017-10-14 DIAGNOSIS — W19XXXA Unspecified fall, initial encounter: Secondary | ICD-10-CM | POA: Insufficient documentation

## 2017-10-14 DIAGNOSIS — S0990XA Unspecified injury of head, initial encounter: Secondary | ICD-10-CM | POA: Diagnosis not present

## 2017-10-14 DIAGNOSIS — R51 Headache: Secondary | ICD-10-CM | POA: Diagnosis not present

## 2017-10-14 DIAGNOSIS — Z8673 Personal history of transient ischemic attack (TIA), and cerebral infarction without residual deficits: Secondary | ICD-10-CM | POA: Diagnosis not present

## 2017-10-20 ENCOUNTER — Encounter: Payer: Self-pay | Admitting: Family Medicine

## 2017-10-20 ENCOUNTER — Ambulatory Visit (INDEPENDENT_AMBULATORY_CARE_PROVIDER_SITE_OTHER): Payer: Medicare Other | Admitting: Family Medicine

## 2017-10-20 VITALS — BP 127/87 | HR 60 | Temp 98.5°F | Resp 16 | Ht 62.0 in | Wt 173.0 lb

## 2017-10-20 DIAGNOSIS — I25118 Atherosclerotic heart disease of native coronary artery with other forms of angina pectoris: Secondary | ICD-10-CM | POA: Diagnosis not present

## 2017-10-20 DIAGNOSIS — I6523 Occlusion and stenosis of bilateral carotid arteries: Secondary | ICD-10-CM | POA: Diagnosis not present

## 2017-10-20 DIAGNOSIS — I200 Unstable angina: Secondary | ICD-10-CM

## 2017-10-20 DIAGNOSIS — Z23 Encounter for immunization: Secondary | ICD-10-CM | POA: Diagnosis not present

## 2017-10-20 MED ORDER — PROMETHAZINE HCL 25 MG PO TABS: 25.0000 mg | ORAL_TABLET | Freq: Four times a day (QID) | ORAL | 3 refills | Status: DC | PRN

## 2017-10-20 NOTE — Progress Notes (Signed)
Patient: [name] Female    DOB: 1970-02-15   47 y.o.   MRN: [id number] Visit Date: 10/20/2017  Today's Provider: [name], MD   Chief Complaint  Patient presents with  . Follow-up   Subjective:    HPI Follow up of CAD: Patient was last seen for this problem 2 months ago. No changes were made during that visit. At that time had recently increased rosuvastatin from three times a week to one every day  Today patient prsents reporting good compliance with treatment, and good tolerance.  She had heart cath in August and lipids were checked at that time.   Lipid Panel  Lab Results  Component Value Date   CHOL 73 09/20/2017   HDL 29 (L) 09/20/2017   LDLCALC 35 09/20/2017   TRIG 45 09/20/2017   CHOLHDL 2.5 09/20/2017   She is followed at [facility] endocrinology for diabetes  Lab Results  Component Value Date   HGBA1C 6.0 (H) 09/19/2017           Allergies  Allergen Reactions  . Lipitor [Atorvastatin] Other (See Comments)    Leg pains  . Tramadol Other (See Comments)    Mouth feels like it's on fire     Current Outpatient Medications:  .  ALPRAZolam (XANAX) 1 MG tablet, Take 1 tablet (1 mg total) by mouth daily as needed for anxiety., Disp: 30 tablet, Rfl: 3 .  amLODipine (NORVASC) 5 MG tablet, Take 1 tablet (5 mg total) by mouth daily., Disp: 30 tablet, Rfl: 0 .  aspirin 81 MG chewable tablet, Chew 1 tablet (81 mg total) by mouth daily., Disp: 30 tablet, Rfl: 11 .  Blood Glucose Monitoring Suppl (ONE TOUCH ULTRA 2) w/Device KIT, Use to check blood sugar once a day. Dx. E11.9, Disp: 1 each, Rfl: 0 .  buPROPion (WELLBUTRIN XL) 150 MG 24 hr tablet, TAKE 1 TABLET BY MOUTH DAILY, Disp: 30 tablet, Rfl: 11 .  Calcium Carbonate-Vitamin D (CALCIUM 500/D PO), Take 500 mg by mouth daily. , Disp: , Rfl:  .  celecoxib (CELEBREX) 200 MG capsule, Take 200 mg by mouth 2 (two) times daily as needed for moderate pain. , Disp: , Rfl:  .  citalopram (CELEXA) 40 MG tablet, Take 1  tablet (40 mg total) by mouth daily., Disp: 30 tablet, Rfl: 5 .  clopidogrel (PLAVIX) 75 MG tablet, Take 1 tablet (75 mg total) by mouth daily with breakfast., Disp: 30 tablet, Rfl: 2 .  Cyanocobalamin (VITAMIN B 12 PO), Take 1,000 mcg by mouth daily. , Disp: , Rfl:  .  gabapentin (NEURONTIN) 300 MG capsule, Take 1 capsule (300 mg total) by mouth 2 (two) times daily., Disp: 180 capsule, Rfl: 3 .  hyoscyamine (LEVSIN SL) 0.125 MG SL tablet, Place 1 tablet (0.125 mg total) under the tongue every 6 (six) hours as needed. (Patient taking differently: Place 0.125 mg under the tongue every 6 (six) hours as needed for cramping. ), Disp: 60 tablet, Rfl: 0 .  isosorbide mononitrate (IMDUR) 120 MG 24 hr tablet, Take 1 tablet (120 mg total) by mouth daily., Disp: 90 tablet, Rfl: 1 .  levothyroxine (SYNTHROID, LEVOTHROID) 25 MCG tablet, Take 1 tablet (25 mcg total) daily before breakfast by mouth., Disp: 30 tablet, Rfl: 12 .  losartan (COZAAR) 25 MG tablet, Take 1/2 tablet (12.5 mg) by mouth once daily, Disp: 45 tablet, Rfl: 1 .  magnesium oxide (MAG-OX) 400 MG tablet, Take 1 tablet (400 mg total)  by mouth daily., Disp: 90 tablet, Rfl: 3 .  metoprolol succinate (TOPROL-XL) 50 MG 24 hr tablet, Take 1 tablet (50 mg total) by mouth daily. Take with or immediately following a meal., Disp: 30 tablet, Rfl: 3 .  Multiple Vitamin (MULTIVITAMIN WITH MINERALS) TABS tablet, Take 1 tablet by mouth daily., Disp: , Rfl:  .  nitroGLYCERIN (NITROSTAT) 0.4 MG SL tablet, Place 1 tablet (0.4 mg total) under the tongue every 5 (five) minutes as needed for chest pain., Disp: 25 tablet, Rfl: 2 .  pantoprazole (PROTONIX) 40 MG tablet, Take 1 tablet (40 mg total) by mouth 2 (two) times daily., Disp: 60 tablet, Rfl: 5 .  pioglitazone (ACTOS) 15 MG tablet, Take 15 mg by mouth daily. , Disp: , Rfl:  .  promethazine (PHENERGAN) 25 MG tablet, Take 25 mg by mouth every 6 (six) hours as needed for nausea or vomiting. , Disp: , Rfl:  .   pyridostigmine (MESTINON) 60 MG tablet, Take 60 mg by mouth 3 (three) times daily. , Disp: , Rfl:  .  rosuvastatin (CRESTOR) 10 MG tablet, Take 1 tablet (10 mg total) by mouth daily., Disp: 30 tablet, Rfl: 11  Review of Systems  Constitutional: Negative for appetite change, chills, fatigue and fever.  Respiratory: Negative for chest tightness and shortness of breath.   Cardiovascular: Negative for chest pain and palpitations.  Gastrointestinal: Negative for abdominal pain, nausea and vomiting.  Neurological: Negative for dizziness and weakness.    Social History   Tobacco Use  . Smoking status: Former Smoker    Types: Cigarettes    Last attempt to quit: 08/31/1994    Years since quitting: 23.1  . Smokeless tobacco: Never Used  . Tobacco comment: quit 28 years ago  Substance Use Topics  . Alcohol use: No    Alcohol/week: 0.0 standard drinks   Objective:   BP 127/87 (BP Location: Left Arm, Patient Position: Sitting, Cuff Size: Normal)   Pulse 60   Temp 98.5 F (36.9 C) (Oral)   Resp 16   Wt 173 lb (78.5 kg)   SpO2 99% Comment: room air  BMI 31.64 kg/m  Vitals:   10/20/17 0811  BP: 127/87  Pulse: 60  Resp: 16  Temp: 98.5 F (36.9 C)  TempSrc: Oral  SpO2: 99%  Weight: 173 lb (78.5 kg)     Physical Exam   General Appearance:    Alert, cooperative, no distress  Eyes:    PERRL, conjunctiva/corneas clear, EOM's intact       Lungs:     Clear to auscultation bilaterally, respirations unlabored  Heart:    Regular rate and rhythm  Neurologic:   Awake, alert, oriented x 3. No apparent focal neurological           defect.          Assessment & Plan:     1. Coronary artery disease of native artery of native heart with stable angina pectoris [facility]) Doing well on daily statin, currently asymptomatic. Continue current medications.    2. Bilateral carotid artery stenosis Continue antiplatelet therapy and statin.   3. Need for influenza vaccination  - Flu Vaccine QUAD 6+  mos PF IM (Fluarix Quad PF)       [name], MD  [facility]

## 2017-10-28 ENCOUNTER — Ambulatory Visit: Payer: Medicare Other | Admitting: Internal Medicine

## 2017-10-30 ENCOUNTER — Ambulatory Visit
Admission: EM | Admit: 2017-10-30 | Discharge: 2017-10-30 | Disposition: A | Discharge status: Home / Self Care | Payer: Medicare Other | Attending: Orthopedic Surgery | Admitting: Orthopedic Surgery

## 2017-10-30 ENCOUNTER — Other Ambulatory Visit: Payer: Self-pay

## 2017-10-30 ENCOUNTER — Other Ambulatory Visit: Payer: Self-pay | Admitting: Family Medicine

## 2017-10-30 ENCOUNTER — Ambulatory Visit: Payer: Medicare Other

## 2017-10-30 DIAGNOSIS — S7001XA Contusion of right hip, initial encounter: Secondary | ICD-10-CM | POA: Diagnosis not present

## 2017-10-30 DIAGNOSIS — S62653A Nondisplaced fracture of medial phalanx of left middle finger, initial encounter for closed fracture: Secondary | ICD-10-CM

## 2017-10-30 DIAGNOSIS — W1789XA Other fall from one level to another, initial encounter: Secondary | ICD-10-CM | POA: Insufficient documentation

## 2017-10-30 DIAGNOSIS — Z79899 Other long term (current) drug therapy: Secondary | ICD-10-CM | POA: Insufficient documentation

## 2017-10-30 DIAGNOSIS — S300XXA Contusion of lower back and pelvis, initial encounter: Secondary | ICD-10-CM

## 2017-10-30 DIAGNOSIS — S3992XA Unspecified injury of lower back, initial encounter: Secondary | ICD-10-CM | POA: Diagnosis not present

## 2017-10-30 DIAGNOSIS — S79911A Unspecified injury of right hip, initial encounter: Secondary | ICD-10-CM | POA: Diagnosis not present

## 2017-10-30 DIAGNOSIS — M545 Low back pain: Secondary | ICD-10-CM | POA: Diagnosis not present

## 2017-10-30 DIAGNOSIS — M25551 Pain in right hip: Secondary | ICD-10-CM | POA: Diagnosis not present

## 2017-10-30 DIAGNOSIS — M84445A Pathological fracture, left finger(s), initial encounter for fracture: Secondary | ICD-10-CM | POA: Diagnosis not present

## 2017-10-30 DIAGNOSIS — M79645 Pain in left finger(s): Secondary | ICD-10-CM | POA: Diagnosis present

## 2017-10-30 DIAGNOSIS — S60032A Contusion of left middle finger without damage to nail, initial encounter: Secondary | ICD-10-CM | POA: Diagnosis not present

## 2017-10-30 DIAGNOSIS — W19XXXA Unspecified fall, initial encounter: Secondary | ICD-10-CM

## 2017-10-30 MED ORDER — CYCLOBENZAPRINE HCL 10 MG PO TABS: 10.0000 mg | ORAL_TABLET | Freq: Two times a day (BID) | ORAL | 0 refills | Status: DC | PRN

## 2017-10-30 MED ORDER — OXYCODONE-ACETAMINOPHEN 5-325 MG PO TABS: 1.0000 | ORAL_TABLET | Freq: Four times a day (QID) | ORAL | 0 refills | Status: DC | PRN

## 2017-10-30 NOTE — ED Provider Notes (Signed)
[facility]    CSN: [id number] Arrival date & time: 10/30/17  0909     History   Chief Complaint Chief Complaint  Patient presents with  . Fall    HPI [name] is a 47 y.o. female.   Presents to the urgent care facility for evaluation of a fall that occurred last night around 6:15 PM.  Patient states she fell off her porch approximately 3 feet landed on her lower back and right hip and injured her left third digit.  She denies loss of consciousness, headache, nausea or vomiting.  She is taking Aleve with mild.  She describes tightness in her lower back with no radicular symptoms.  No loss of bowel or bladder symptoms.  Her pain is moderate in the left third digit along the middle phalanx and moderate along the lower back left and right side as well as the right lateral hip.  She denies any groin pain.  No nausea or vomiting.  No neck pain numbness tingling radicular symptoms in the upper extremity.  No chest pain or shortness of breath.  HPI  Past Medical History:  Diagnosis Date  . Anemia    iron deficiency anemia  . Aortic arch aneurysm ([place])   . Bipolar disorder ([place])   . BRCA negative 2014  . CAD (coronary artery disease)    a. 08/2003 Cath: LAD 30-40-med Rx; b. 11/2014 PCI: LAD 79m(3.25x23 Xience Alpine DES); c. 06/2015 PCI: D1 (2.25x12 Resolute Integrity DES); d. 06/2017 PCI: Patent mLAD stent, D2 95 (PTCA); e. 09/2017 Cath/PCI: LM nl, LAD 348m70d, D1 40, D2 99ost (CBA), LCX 40p, RCA 40ost/p.  . Marland KitchenKD (chronic kidney disease), stage III (HCD[place]  . Colon polyp   . CVA (cerebral vascular accident) (HCD[place]   Left side weakness.   . Diabetes (HCT[place]  . Family history of breast cancer    BRCA neg 2014  . Gastric ulcer 04/27/2011  . History of echocardiogram    a. 03/2017 Echo: EF 60-65%, no rwma.  . Marland KitchenTN (hypertension)   . Hyperlipemia   . Hypothyroid   . Malignant melanoma of skin of scalp (HCWallburg  . MI, acute, non ST segment elevation (HCPot[place]  . Neuromuscular  disorder (HCLincoln  . S/P drug eluting coronary stent placement 06/04/2015  . Sepsis (HCQuincy2/14/2019    Patient Active Problem List   Diagnosis Date Noted  . Effort angina (HCSilverstreet05/28/2019  . Unstable angina (HCLebam05/23/2019  . Syncope 04/09/2017  . Insomnia 03/18/2017  . Ischemic cardiomyopathy   . Arthritis   . Anxiety   . Acute colitis 01/27/2017  . Hx of colonic polyps   . Bariatric surgery status   . Sinus tachycardia 02/27/2016  . H/O medication noncompliance 12/14/2015  . Acute pyelonephritis   . Emesis   . CKD (chronic kidney disease), stage IV (HCTallaboa Alta  . CAD (coronary artery disease)   . Hypertensive heart disease   . CKD (chronic kidney disease) stage 3, GFR 30-59 ml/min (HCC) 06/05/2015  . Presence of drug coated stent in LAD coronary artery & now in D1 06/04/2015  . Colitis 06/03/2015  . Carotid stenosis 04/30/2015  . Type 2 diabetes mellitus with complication, without long-term current use of insulin (HCCadiz  . Atypical chest pain   . Iron deficiency anemia 03/22/2015  . Vitamin B12 deficiency 02/18/2015  . Misuse of medications for pain 02/18/2015  . Severe recurrent major depression without psychotic features (  [place]) 02/15/2015  . Helicobacter pylori infection 11/23/2014  . Hemiparesis, left ([place]) 11/23/2014  . Benign neoplasm of colon 11/20/2014  . Malignant melanoma ([place]) 08/25/2014  . Chronic systolic CHF (congestive heart failure) ([place])   . Incomplete bladder emptying 07/12/2014  . Adult hypothyroidism 12/30/2013  . Aberrant subclavian artery 11/17/2013  . Multiple sclerosis ([place]) 11/02/2013  . History of CVA (cerebrovascular accident) 06/20/2013  . Headache, migraine 05/29/2013  . Hyperlipidemia   . GERD (gastroesophageal reflux disease)   . Neuropathy ([place]) 01/02/2011  . CVA (cerebral vascular accident) ([place]) 06/21/2008  . Essential hypertension 05/01/2008    Past Surgical History:  Procedure Laterality Date  . APPENDECTOMY    . CARDIAC  CATHETERIZATION N/A 11/09/2014   Procedure: Coronary Angiography;  Surgeon: Minna Merritts, MD;  Location: [place] [facility];  Service: Cardiovascular;  Laterality: N/A;  . CARDIAC CATHETERIZATION N/A 11/12/2014   Procedure: Coronary Stent Intervention;  Surgeon: Isaias Cowman, MD;  Location: [place] [facility];  Service: Cardiovascular;  Laterality: N/A;  . CARDIAC CATHETERIZATION N/A 04/18/2015   Procedure: Left Heart Cath and Coronary Angiography;  Surgeon: Minna Merritts, MD;  Location: [place] [facility];  Service: Cardiovascular;  Laterality: N/A;  . CARDIAC CATHETERIZATION Left 06/04/2015   Procedure: Left Heart Cath and Coronary Angiography;  Surgeon: Wellington Hampshire, MD;  Location: [place] [facility];  Service: Cardiovascular;  Laterality: Left;  . CARDIAC CATHETERIZATION N/A 06/04/2015   Procedure: Coronary Stent Intervention;  Surgeon: Wellington Hampshire, MD;  Location: [place] [facility];  Service: Cardiovascular;  Laterality: N/A;  . CESAREAN SECTION  2001  . CHOLECYSTECTOMY N/A 11/18/2016   Procedure: LAPAROSCOPIC CHOLECYSTECTOMY WITH INTRAOPERATIVE CHOLANGIOGRAM;  Surgeon: Christene Lye, MD;  Location: [facility];  Service: General;  Laterality: N/A;  . COLONOSCOPY WITH PROPOFOL N/A 04/27/2016   Procedure: COLONOSCOPY WITH PROPOFOL;  Surgeon: [name], MD;  Location: [place];  Service: Endoscopy;  Laterality: N/A;  . CORONARY ANGIOPLASTY    . CORONARY BALLOON ANGIOPLASTY N/A 06/29/2017   Procedure: CORONARY BALLOON ANGIOPLASTY;  Surgeon: Wellington Hampshire, MD;  Location: [place] [facility];  Service: Cardiovascular;  Laterality: N/A;  . CORONARY BALLOON ANGIOPLASTY N/A 09/20/2017   Procedure: CORONARY BALLOON ANGIOPLASTY;  Surgeon: Wellington Hampshire, MD;  Location: [facility] [employer] [facility];  Service: Cardiovascular;  Laterality: N/A;  . DILATION AND CURETTAGE OF UTERUS    . ESOPHAGOGASTRODUODENOSCOPY (EGD) WITH PROPOFOL N/A 09/14/2014   Procedure:  ESOPHAGOGASTRODUODENOSCOPY (EGD) WITH PROPOFOL;  Surgeon: Josefine Class, MD;  Location[facility];  Service: Endoscopy;  Laterality: N/A;  . ESOPHAGOGASTRODUODENOSCOPY (EGD) WITH PROPOFOL N/A 04/27/2016   Procedure: ESOPHAGOGASTRODUODENOSCOPY (EGD) WITH PROPOFOL;  Surgeon: [name], MD;  Location: [place];  Service: Endoscopy;  Laterality: N/A;  Diabetic - oral meds  . GASTRIC BYPASS  09/2009   [facility]   . Left Carotid to sublcavian artery bypass w/ subclavian artery ligation     a. Performed @ [facility].  . LEFT HEART CATH AND CORONARY ANGIOGRAPHY Left 06/29/2017   Procedure: LEFT HEART CATH AND CORONARY ANGIOGRAPHY;  Surgeon: Wellington Hampshire, MD;  Location: [place] [facility];  Service: Cardiovascular;  Laterality: Left;  . LEFT HEART CATH AND CORONARY ANGIOGRAPHY N/A 09/20/2017   Procedure: LEFT HEART CATH AND CORONARY ANGIOGRAPHY;  Surgeon: Wellington Hampshire, MD;  Location: [place] [facility];  Service: Cardiovascular;  Laterality: N/A;  . MELANOMA EXCISION  2016   Dr. Evorn Gong  .  [place]  2002  . RIGHT OOPHORECTOMY    . SHOULDER ARTHROSCOPY WITH OPEN ROTATOR CUFF REPAIR Right 01/07/2016   Procedure: SHOULDER ARTHROSCOPY WITH DEBRIDMENT, SUBACHROMIAL DECOMPRESSION;  Surgeon: Corky Mull, MD;  Location: [facility];  Service: Orthopedics;  Laterality: Right;  . SHOULDER ARTHROSCOPY WITH OPEN ROTATOR CUFF REPAIR Right 03/16/2017   Procedure: SHOULDER ARTHROSCOPY WITH OPEN ROTATOR CUFF REPAIR POSSIBLE BICEPS TENODESIS;  Surgeon: Corky Mull, MD;  Location: [facility];  Service: Orthopedics;  Laterality: Right;  . TRIGGER FINGER RELEASE Right     Middle Finger    OB History    Gravida  7   Para  1   Term      Preterm      AB      Living        SAB      TAB      Ectopic      Multiple      Live Births               Home Medications    Prior to Admission medications   Medication Sig Start Date End Date Taking?  Authorizing Provider  ALPRAZolam Duanne Moron) 1 MG tablet Take 1 tablet (1 mg total) by mouth daily as needed for anxiety. 08/18/17  Yes [name], MD  amLODipine (NORVASC) 5 MG tablet Take 1 tablet (5 mg total) by mouth daily. 09/21/17  Yes Epifanio Lesches, MD  aspirin 81 MG chewable tablet Chew 1 tablet (81 mg total) by mouth daily. 07/01/17  Yes Dunn, Areta Haber, PA-C  Blood Glucose Monitoring Suppl (ONE TOUCH ULTRA 2) w/Device KIT Use to check blood sugar once a day. Dx. E11.9 03/18/17  Yes [name], MD  buPROPion (WELLBUTRIN XL) 150 MG 24 hr tablet TAKE 1 TABLET BY MOUTH DAILY 08/10/17  Yes [name], MD  Calcium Carbonate-Vitamin D (CALCIUM 500/D PO) Take 500 mg by mouth daily.    Yes [provider]  celecoxib (CELEBREX) 200 MG capsule Take 200 mg by mouth 2 (two) times daily as needed for moderate pain.    Yes [provider]  citalopram (CELEXA) 40 MG tablet Take 1 tablet (40 mg total) by mouth daily. 06/08/17  Yes [name], MD  clopidogrel (PLAVIX) 75 MG tablet Take 1 tablet (75 mg total) by mouth daily with breakfast. 07/01/17  Yes Dunn, Areta Haber, PA-C  Cyanocobalamin (VITAMIN B 12 PO) Take 1,000 mcg by mouth daily.    Yes [provider]  gabapentin (NEURONTIN) 300 MG capsule Take 1 capsule (300 mg total) by mouth 2 (two) times daily. 06/04/17  Yes [name], MD  hyoscyamine (LEVSIN SL) 0.125 MG SL tablet Place 1 tablet (0.125 mg total) under the tongue every 6 (six) hours as needed. Patient taking differently: Place 0.125 mg under the tongue every 6 (six) hours as needed for cramping.  02/01/17  Yes [name], MD  isosorbide mononitrate (IMDUR) 120 MG 24 hr tablet Take 1 tablet (120 mg total) by mouth daily. 10/13/17  Yes Dunn, Areta Haber, PA-C  losartan (COZAAR) 25 MG tablet Take 1/2 tablet (12.5 mg) by mouth once daily 10/13/17  Yes Dunn, Areta Haber, PA-C  metoprolol succinate (TOPROL-XL) 50 MG 24 hr tablet Take 1 tablet (50 mg total) by  mouth daily. Take with or immediately following a meal. 07/01/17  Yes Dunn, Areta Haber, PA-C  Multiple Vitamin (MULTIVITAMIN WITH MINERALS) TABS tablet Take 1 tablet by mouth  daily. 04/14/17  Yes Gouru, Illene Silver, MD  nitroGLYCERIN (NITROSTAT) 0.4 MG SL tablet Place 1 tablet (0.4 mg total) under the tongue every 5 (five) minutes as needed for chest pain. 06/02/17  Yes Gollan, Kathlene November, MD  pantoprazole (PROTONIX) 40 MG tablet Take 1 tablet (40 mg total) by mouth 2 (two) times daily. 05/11/16  Yes [name], MD  pioglitazone (ACTOS) 15 MG tablet Take 15 mg by mouth daily.  08/03/17 08/03/18 Yes [provider]  promethazine (PHENERGAN) 25 MG tablet Take 1 tablet (25 mg total) by mouth every 6 (six) hours as needed for nausea or vomiting. 10/20/17  Yes [name], MD  pyridostigmine (MESTINON) 60 MG tablet Take 60 mg by mouth 3 (three) times daily.  07/26/17  Yes [provider]  rosuvastatin (CRESTOR) 10 MG tablet Take 1 tablet (10 mg total) by mouth daily. 08/10/17  Yes Gollan, Kathlene November, MD  cyclobenzaprine (FLEXERIL) 10 MG tablet Take 1 tablet (10 mg total) by mouth 2 (two) times daily as needed for muscle spasms. 10/30/17   Duanne Guess, PA-C  levothyroxine (SYNTHROID, LEVOTHROID) 25 MCG tablet Take 1 tablet (25 mcg total) daily before breakfast by mouth. 12/18/16   [name], MD  magnesium oxide (MAG-OX) 400 MG tablet Take 1 tablet (400 mg total) by mouth daily. 07/12/17   Dunn, Areta Haber, PA-C  oxyCODONE-acetaminophen (PERCOCET/ROXICET) 5-325 MG tablet Take 1 tablet by mouth every 6 (six) hours as needed for severe pain. 10/30/17   Duanne Guess, PA-C    Family History Family History  Problem Relation Age of Onset  . Hypertension Mother   . Anxiety disorder Mother   . Depression Mother   . Bipolar disorder Mother   . Heart disease Mother        No details  . Hyperlipidemia Mother   . Kidney disease Father   . Heart disease Father 5  . Hypertension Father   .  Diabetes Father   . Stroke Father   . Colon cancer Father        dx in his 52's  . Anxiety disorder Father   . Depression Father   . Skin cancer Father   . Kidney disease Sister   . Thyroid nodules Sister   . Hypertension Sister   . Hypertension Sister   . Diabetes Sister   . Hyperlipidemia Sister   . Depression Sister   . Breast cancer Maternal Aunt 86  . Breast cancer Maternal Aunt 59  . Ovarian cancer Cousin   . Colon cancer Cousin   . Breast cancer Other   . Kidney cancer Neg Hx   . Bladder Cancer Neg Hx     Social History Social History   Tobacco Use  . Smoking status: Former Smoker    Types: Cigarettes    Last attempt to quit: 08/31/1994    Years since quitting: 23.1  . Smokeless tobacco: Never Used  . Tobacco comment: quit 28 years ago  Substance Use Topics  . Alcohol use: No    Alcohol/week: 0.0 standard drinks  . Drug use: No     Allergies   Lipitor [atorvastatin] and Tramadol   Review of Systems Review of Systems  Constitutional: Negative for activity change.  Eyes: Negative for pain and visual disturbance.  Respiratory: Negative for shortness of breath.   Cardiovascular: Negative for chest pain and leg swelling.  Gastrointestinal: Negative for abdominal pain.  Genitourinary: Negative for flank pain and pelvic pain.  Musculoskeletal:  Positive for arthralgias, back pain and myalgias. Negative for gait problem, joint swelling, neck pain and neck stiffness.  Skin: Negative for wound.  Neurological: Negative for dizziness, syncope, weakness, light-headedness, numbness and headaches.  Psychiatric/Behavioral: Negative for confusion and decreased concentration.     Physical Exam Triage Vital Signs ED Triage Vitals  Enc Vitals Group     BP 10/30/17 0920 (!) 170/98     Pulse Rate 10/30/17 0920 64     Resp 10/30/17 0920 16     Temp 10/30/17 0920 98.1 F (36.7 C)     Temp Source 10/30/17 0920 Oral     SpO2 10/30/17 0920 100 %     Weight 10/30/17  0921 170 lb (77.1 kg)     Height 10/30/17 0921 _0  (1.575 m)     Head Circumference --      Peak Flow --      Pain Score 10/30/17 0920 10     Pain Loc --      Pain Edu? --      Excl. in [place]? --    No data found.  Updated Vital Signs BP (!) 170/98 (BP Location: Left Arm)   Pulse 64   Temp 98.1 F (36.7 C) (Oral)   Resp 16   Ht _1  (1.575 m)   Wt 170 lb (77.1 kg)   SpO2 100%   BMI 31.09 kg/m   Visual Acuity Right Eye Distance:   Left Eye Distance:   Bilateral Distance:    Right Eye Near:   Left Eye Near:    Bilateral Near:     Physical Exam  Constitutional: She is oriented to person, place, and time. She appears well-developed and well-nourished.  HENT:  Head: Normocephalic and atraumatic.  Right Ear: External ear normal.  Left Ear: External ear normal.  Nose: Nose normal.  Eyes: Pupils are equal, round, and reactive to light. Conjunctivae and EOM are normal.  Neck: Normal range of motion.  Cardiovascular: Normal rate.  Pulmonary/Chest: Effort normal and breath sounds normal. No respiratory distress.  Abdominal: Soft. There is no tenderness.  Musculoskeletal:  Cervical spine with normal range of motion and no spinous process tenderness.  Mild right paravertebral muscle tenderness along the trapezius but no clavicle tenderness and no proximal humerus tenderness.  She has no rib tenderness or step-off.  She is nontender throughout the chest.  No tenderness throughout the thoracic spine she has good range of motion of the elbow wrist and digits except for tenderness and limited range of motion of the left third digit and pain to palpation along the PIP and DIP joint of the left third digit.  No lacerations noted.  No tendon deficits noted in the left hand.  She has mild tenderness along the lower lumbar spine at the lumbosacral junction as well as tenderness along the right hip trochanteric bursa.  She has mild stiffness of right hip internal rotation with minimal pain with  right hip internal rotation.  She is able straight leg raise.  She is ambulatory with no assistive devices.  Neurological: She is alert and oriented to person, place, and time. No cranial nerve deficit. Coordination normal.  Skin: Skin is warm and dry. No rash noted.  Psychiatric: She has a normal mood and affect. Her behavior is normal.     UC Treatments / Results  Labs (all labs ordered are listed, but only abnormal results are displayed) Labs Reviewed - No data to display  EKG None  Radiology  No results found.  Procedures Splint Application Date/Time: 1/83/4373 11:00 AM Performed by: Duanne Guess, PA-C Authorized by: Duanne Guess, PA-C   Consent:    Consent obtained:  Verbal   Consent given by:  Patient   Alternatives discussed:  No treatment Pre-procedure details:    Sensation:  Normal Procedure details:    Laterality:  Left   Location:  Finger   Finger:  L long finger   Strapping: no     Supplies:  Aluminum splint Post-procedure details:    Pain:  Improved   Sensation:  Normal   Patient tolerance of procedure:  Tolerated well, no immediate complications   (including critical care time)  Medications Ordered in UC Medications - No data to display  Initial Impression / Assessment and Plan / UC Course  I have reviewed the triage vital signs and the nursing notes.  Pertinent labs & imaging results that were available during my care of the patient were reviewed by me and considered in my medical decision making (see chart for details).    47 year old female with fall yesterday with pain to the left third digit, lower back and right hip.  X-rays of lumbar spine and hip negative she does have some mild right hip osteoarthritis.  X-rays read of the left third digit are negative but there is concern for nondisplaced distal middle phalanx fracture at the DIP joint with intra-articular extension.  Patient is placed into a AlumaFoam splint.  She is given  prescription for pain medication and muscle relaxer.  She understands signs symptoms return to clinic for Final Clinical Impressions(s) / UC Diagnoses   Final diagnoses:  Fall, initial encounter  Contusion of lower back, initial encounter  Contusion of right hip, initial encounter  Nondisplaced fracture of middle phalanx of left middle finger, initial encounter for closed fracture   Discharge Instructions   None    ED Prescriptions    Medication Sig Dispense Auth. Provider   oxyCODONE-acetaminophen (PERCOCET/ROXICET) 5-325 MG tablet Take 1 tablet by mouth every 6 (six) hours as needed for severe pain. 15 tablet Duanne Guess, PA-C   cyclobenzaprine (FLEXERIL) 10 MG tablet Take 1 tablet (10 mg total) by mouth 2 (two) times daily as needed for muscle spasms. 20 tablet Duanne Guess, PA-C     Controlled Substance Prescriptions [place] Controlled Substance Registry consulted? Yes, I have consulted the [place] Controlled Substances Registry for this patient, and feel the risk/benefit ratio today is favorable for proceeding with this prescription for a controlled substance.   Duanne Guess, PA-C 10/30/17 1102

## 2017-10-30 NOTE — ED Triage Notes (Signed)
Pt states she fell off her porch yesterday when she got tangled up with the dog, states she tried to grab the column but still fell, pt c/o BL hand pain, worse on the left, left hip/back pain, neck pain , states she did hit her head on the grassy ground, denies LOC, no noted lac. Pt is a/ox4, ambulatory with slight difficulty due to pain.

## 2017-10-30 NOTE — Discharge Instructions (Addendum)
Please keep splint on left third digit at all times except for showering.  Call orthopedic office Monday morning to schedule follow-up appointment.  Return to the urgent care facility for any increasing pain worsening symptoms or to changes in health.

## 2017-11-01 ENCOUNTER — Other Ambulatory Visit: Payer: Self-pay | Admitting: *Deleted

## 2017-11-01 MED ORDER — AMLODIPINE BESYLATE 5 MG PO TABS: 5.0000 mg | ORAL_TABLET | Freq: Every day | ORAL | 3 refills | Status: DC

## 2017-11-04 ENCOUNTER — Encounter: Payer: Self-pay | Admitting: Emergency Medicine

## 2017-11-04 ENCOUNTER — Emergency Department
Admission: EM | Admit: 2017-11-04 | Discharge: 2017-11-04 | Disposition: A | Discharge status: Home / Self Care | Payer: Medicare Other | Attending: Emergency Medicine | Admitting: Emergency Medicine

## 2017-11-04 ENCOUNTER — Other Ambulatory Visit: Payer: Self-pay

## 2017-11-04 ENCOUNTER — Emergency Department: Payer: Medicare Other

## 2017-11-04 DIAGNOSIS — R42 Dizziness and giddiness: Secondary | ICD-10-CM | POA: Diagnosis not present

## 2017-11-04 DIAGNOSIS — I130 Hypertensive heart and chronic kidney disease with heart failure and stage 1 through stage 4 chronic kidney disease, or unspecified chronic kidney disease: Secondary | ICD-10-CM | POA: Insufficient documentation

## 2017-11-04 DIAGNOSIS — Z79899 Other long term (current) drug therapy: Secondary | ICD-10-CM | POA: Diagnosis not present

## 2017-11-04 DIAGNOSIS — M546 Pain in thoracic spine: Secondary | ICD-10-CM | POA: Diagnosis not present

## 2017-11-04 DIAGNOSIS — N183 Chronic kidney disease, stage 3 unspecified: Secondary | ICD-10-CM

## 2017-11-04 DIAGNOSIS — I129 Hypertensive chronic kidney disease with stage 1 through stage 4 chronic kidney disease, or unspecified chronic kidney disease: Secondary | ICD-10-CM | POA: Diagnosis not present

## 2017-11-04 DIAGNOSIS — Z7982 Long term (current) use of aspirin: Secondary | ICD-10-CM | POA: Diagnosis not present

## 2017-11-04 DIAGNOSIS — S299XXA Unspecified injury of thorax, initial encounter: Secondary | ICD-10-CM | POA: Diagnosis not present

## 2017-11-04 DIAGNOSIS — S199XXA Unspecified injury of neck, initial encounter: Secondary | ICD-10-CM | POA: Diagnosis not present

## 2017-11-04 DIAGNOSIS — E1122 Type 2 diabetes mellitus with diabetic chronic kidney disease: Secondary | ICD-10-CM | POA: Insufficient documentation

## 2017-11-04 DIAGNOSIS — M545 Low back pain: Secondary | ICD-10-CM | POA: Diagnosis not present

## 2017-11-04 DIAGNOSIS — N179 Acute kidney failure, unspecified: Secondary | ICD-10-CM

## 2017-11-04 DIAGNOSIS — Z7902 Long term (current) use of antithrombotics/antiplatelets: Secondary | ICD-10-CM | POA: Insufficient documentation

## 2017-11-04 DIAGNOSIS — R51 Headache: Secondary | ICD-10-CM | POA: Insufficient documentation

## 2017-11-04 DIAGNOSIS — N184 Chronic kidney disease, stage 4 (severe): Secondary | ICD-10-CM | POA: Diagnosis not present

## 2017-11-04 DIAGNOSIS — S3992XA Unspecified injury of lower back, initial encounter: Secondary | ICD-10-CM | POA: Diagnosis not present

## 2017-11-04 DIAGNOSIS — S0990XA Unspecified injury of head, initial encounter: Secondary | ICD-10-CM | POA: Diagnosis not present

## 2017-11-04 DIAGNOSIS — Z7984 Long term (current) use of oral hypoglycemic drugs: Secondary | ICD-10-CM | POA: Diagnosis not present

## 2017-11-04 DIAGNOSIS — I5022 Chronic systolic (congestive) heart failure: Secondary | ICD-10-CM | POA: Insufficient documentation

## 2017-11-04 DIAGNOSIS — M5442 Lumbago with sciatica, left side: Secondary | ICD-10-CM | POA: Insufficient documentation

## 2017-11-04 DIAGNOSIS — Z87891 Personal history of nicotine dependence: Secondary | ICD-10-CM | POA: Diagnosis not present

## 2017-11-04 DIAGNOSIS — M5489 Other dorsalgia: Secondary | ICD-10-CM | POA: Diagnosis present

## 2017-11-04 LAB — COMPREHENSIVE METABOLIC PANEL
ALBUMIN: 3.5 g/dL (ref 3.5–5.0)
ALK PHOS: 128 U/L — AB (ref 38–126)
ALT: 12 U/L (ref 0–44)
ANION GAP: 7 (ref 5–15)
AST: 16 U/L (ref 15–41)
BUN: 27 mg/dL — ABNORMAL HIGH (ref 6–20)
CALCIUM: 7.7 mg/dL — AB (ref 8.9–10.3)
CO2: 19 mmol/L — AB (ref 22–32)
Chloride: 110 mmol/L (ref 98–111)
Creatinine, Ser: 2.67 mg/dL — ABNORMAL HIGH (ref 0.44–1.00)
GFR calc non Af Amer: 20 mL/min — ABNORMAL LOW (ref >60)
GFR, EST AFRICAN AMERICAN: 23 mL/min — AB (ref >60)
GLUCOSE: 97 mg/dL (ref 70–99)
Potassium: 3.9 mmol/L (ref 3.5–5.1)
SODIUM: 136 mmol/L (ref 135–145)
TOTAL PROTEIN: 6.3 g/dL — AB (ref 6.5–8.1)
Total Bilirubin: 0.4 mg/dL (ref 0.3–1.2)

## 2017-11-04 LAB — CBC WITH DIFFERENTIAL/PLATELET
BASOS ABS: 0 10*3/uL (ref 0–0.1)
BASOS PCT: 0 %
Eosinophils Absolute: 0.2 10*3/uL (ref 0–0.7)
Eosinophils Relative: 2 %
HEMATOCRIT: 32.4 % — AB (ref 35.0–47.0)
HEMOGLOBIN: 11 g/dL — AB (ref 12.0–16.0)
LYMPHS PCT: 5 %
Lymphs Abs: 0.5 10*3/uL — ABNORMAL LOW (ref 1.0–3.6)
MCH: 27.2 pg (ref 26.0–34.0)
MCHC: 33.9 g/dL (ref 32.0–36.0)
MCV: 80.3 fL (ref 80.0–100.0)
Monocytes Absolute: 0.6 10*3/uL (ref 0.2–0.9)
Monocytes Relative: 7 %
NEUTROS ABS: 8.2 10*3/uL — AB (ref 1.4–6.5)
NEUTROS PCT: 86 %
PLATELETS: 154 10*3/uL (ref 150–440)
RBC: 4.03 MIL/uL (ref 3.80–5.20)
RDW: 13.8 % (ref 11.5–14.5)
WBC: 9.6 10*3/uL (ref 3.6–11.0)

## 2017-11-04 LAB — URINALYSIS, COMPLETE (UACMP) WITH MICROSCOPIC
BILIRUBIN URINE: NEGATIVE
Glucose, UA: NEGATIVE mg/dL
HGB URINE DIPSTICK: NEGATIVE
KETONES UR: NEGATIVE mg/dL
LEUKOCYTES UA: NEGATIVE
NITRITE: NEGATIVE
Protein, ur: NEGATIVE mg/dL
SPECIFIC GRAVITY, URINE: 1.008 (ref 1.005–1.030)
pH: 5 (ref 5.0–8.0)

## 2017-11-04 LAB — POCT PREGNANCY, URINE: Preg Test, Ur: NEGATIVE

## 2017-11-04 MED ORDER — MORPHINE SULFATE (PF) 4 MG/ML IV SOLN
4.0000 mg | Freq: Once | INTRAVENOUS | Status: AC
  Administered 2017-11-04: 4 mg via INTRAVENOUS
  Filled 2017-11-04: qty 1

## 2017-11-04 MED ORDER — PREDNISONE 50 MG PO TABS: 50.0000 mg | ORAL_TABLET | Freq: Every day | ORAL | 0 refills | Status: DC

## 2017-11-04 MED ORDER — CALCIUM CARBONATE-VITAMIN D 500-200 MG-UNIT PO TABS: 1.0000 | ORAL_TABLET | Freq: Two times a day (BID) | ORAL | 0 refills | Status: DC

## 2017-11-04 MED ORDER — SODIUM CHLORIDE 0.9 % IV BOLUS
1000.0000 mL | Freq: Once | INTRAVENOUS | Status: AC
  Administered 2017-11-04: 1000 mL via INTRAVENOUS

## 2017-11-04 MED ORDER — ONDANSETRON HCL 4 MG/2ML IJ SOLN
4.0000 mg | Freq: Once | INTRAMUSCULAR | Status: AC
  Administered 2017-11-04: 4 mg via INTRAVENOUS
  Filled 2017-11-04: qty 2

## 2017-11-04 MED ORDER — HYDROCODONE-ACETAMINOPHEN 5-325 MG PO TABS: 1.0000 | ORAL_TABLET | ORAL | 0 refills | Status: DC | PRN

## 2017-11-04 MED ORDER — METHOCARBAMOL 500 MG PO TABS: 500.0000 mg | ORAL_TABLET | Freq: Four times a day (QID) | ORAL | 0 refills | Status: DC

## 2017-11-04 NOTE — ED Notes (Signed)
Patient transported to CT 

## 2017-11-04 NOTE — ED Notes (Signed)
PT signed hardcopy of E-signature because pad not working.

## 2017-11-04 NOTE — ED Triage Notes (Signed)
Pt presents to ED via POV with c/o lower back pain. Pt states fell last Friday. Pt states pain worse since being seen at [facility] on Saturday morning.

## 2017-11-04 NOTE — ED Provider Notes (Signed)
[facility] Emergency Department Provider Note  ____________________________________________  Time seen: Approximately 4:18 PM  I have reviewed the triage vital signs and the nursing notes.   HISTORY  Chief Complaint Back Pain    HPI [name] is a 47 y.o. female who presents the emergency department complaining of headache, dizziness, lower back pain, loss of bowel control.  Patient fell off her porch, approximately 3 feet high 6 days ago.  Patient presented to urgent care the next day, complaining of lower back, left hip, left hand pain.  X-rays revealed no acute osseous abnormality to the lower back or hip.  Fracture to the third digit of the left hand.  Finger was splinted.  Patient was given muscle relaxer and pain medication for symptoms.  Patient reports that the pain has increased in her lower back.  She reports that she has no control over her bowel at this time.  No urinary incontinence or retention is reported.  She denies any saddle anesthesia.  She does have some mild paresthesias in the left leg.  Patient also reports that this time that she did hit her head in the fall last week.  Since then she is complaining of a worsening headache, dizziness, incoordination with her hands.  Patient reports that sometimes she will reach for an item, miss or not get over.  She states that this is new since fall.  Patient denies any visual changes, chest pain, shortness of breath, abdominal pain, diarrhea or constipation at this time.  Patient has a significant medical history to include anemia, aortic aneurysm, coronary artery disease, chronic kidney disease, history of CVA, diabetes, gastric ulcer from H. pylori, melanoma, previous MI,  unstable angina.  Patient denies any complaints with chronic medical problems at this time.  Patient denies any abdominal or mid back pain.  Complaints are primarily centered around headache, dizziness, coordination, lower back pain,  bowel dysfunction, paresthesias of the left lower extremity.   Past Medical History:  Diagnosis Date  . Anemia    iron deficiency anemia  . Aortic arch aneurysm ([place])   . Bipolar disorder ([place])   . BRCA negative 2014  . CAD (coronary artery disease)    a. 08/2003 Cath: LAD 30-40-med Rx; b. 11/2014 PCI: LAD 51m(3.25x23 Xience Alpine DES); c. 06/2015 PCI: D1 (2.25x12 Resolute Integrity DES); d. 06/2017 PCI: Patent mLAD stent, D2 95 (PTCA); e. 09/2017 Cath/PCI: LM nl, LAD 328m70d, D1 40, D2 99ost (CBA), LCX 40p, RCA 40ost/p.  . Marland KitchenKD (chronic kidney disease), stage III (HCPalmyra  . Colon polyp   . CVA (cerebral vascular accident) (HCStonewall   Left side weakness.   . Diabetes (HCPrem[place]  . Family history of breast cancer    BRCA neg 2014  . Gastric ulcer 04/27/2011  . History of echocardiogram    a. 03/2017 Echo: EF 60-65%, no rwma.  . Marland KitchenTN (hypertension)   . Hyperlipemia   . Hypothyroid   . Malignant melanoma of skin of scalp (HCMascotte  . MI, acute, non ST segment elevation (HCPickerington  . Neuromuscular disorder (HCBowling Green  . S/P drug eluting coronary stent placement 06/04/2015  . Sepsis (HCDodge2/14/2019    Patient Active Problem List   Diagnosis Date Noted  . Effort angina (HCLiberty05/28/2019  . Unstable angina (HCWhitesboro05/23/2019  . Syncope 04/09/2017  . Insomnia 03/18/2017  . Ischemic cardiomyopathy   . Arthritis   . Anxiety   . Acute colitis 01/27/2017  .  Hx of colonic polyps   . Bariatric surgery status   . Sinus tachycardia 02/27/2016  . H/O medication noncompliance 12/14/2015  . Acute pyelonephritis   . Emesis   . CKD (chronic kidney disease), stage IV ([place])   . CAD (coronary artery disease)   . Hypertensive heart disease   . CKD (chronic kidney disease) stage 3, GFR 30-59 ml/min (HCC) 06/05/2015  . Presence of drug coated stent in LAD coronary artery & now in D1 06/04/2015  . Colitis 06/03/2015  . Carotid stenosis 04/30/2015  . Type 2 diabetes mellitus with complication, without long-term  current use of insulin ([place])   . Atypical chest pain   . Iron deficiency anemia 03/22/2015  . Vitamin B12 deficiency 02/18/2015  . Misuse of medications for pain 02/18/2015  . Severe recurrent major depression without psychotic features ([place]) 02/15/2015  . Helicobacter pylori infection 11/23/2014  . Hemiparesis, left ([place]) 11/23/2014  . Benign neoplasm of colon 11/20/2014  . Malignant melanoma ([place]) 08/25/2014  . Chronic systolic CHF (congestive heart failure) ([place])   . Incomplete bladder emptying 07/12/2014  . Adult hypothyroidism 12/30/2013  . Aberrant subclavian artery 11/17/2013  . Multiple sclerosis ([place]) 11/02/2013  . History of CVA (cerebrovascular accident) 06/20/2013  . Headache, migraine 05/29/2013  . Hyperlipidemia   . GERD (gastroesophageal reflux disease)   . Neuropathy ([place]) 01/02/2011  . CVA (cerebral vascular accident) ([place]) 06/21/2008  . Essential hypertension 05/01/2008    Past Surgical History:  Procedure Laterality Date  . APPENDECTOMY    . CARDIAC CATHETERIZATION N/A 11/09/2014   Procedure: Coronary Angiography;  Surgeon: Minna Merritts, MD;  Location: [place] [facility] LAB;  Service: Cardiovascular;  Laterality: N/A;  . CARDIAC CATHETERIZATION N/A 11/12/2014   Procedure: Coronary Stent Intervention;  Surgeon: Isaias Cowman, MD;  Location: [place] CV LAB;  Service: Cardiovascular;  Laterality: N/A;  . CARDIAC CATHETERIZATION N/A 04/18/2015   Procedure: Left Heart Cath and Coronary Angiography;  Surgeon: Minna Merritts, MD;  Location: [place] CV LAB;  Service: Cardiovascular;  Laterality: N/A;  . CARDIAC CATHETERIZATION Left 06/04/2015   Procedure: Left Heart Cath and Coronary Angiography;  Surgeon: Wellington [place], MD;  Location: [place] CV LAB;  Service: Cardiovascular;  Laterality: Left;  . CARDIAC CATHETERIZATION N/A 06/04/2015   Procedure: Coronary Stent Intervention;  Surgeon: [place], MD;  Location: [place] CV LAB;   Service: Cardiovascular;  Laterality: N/A;  . CESAREAN SECTION  2001  . CHOLECYSTECTOMY N/A 11/18/2016   Procedure: LAPAROSCOPIC CHOLECYSTECTOMY WITH INTRAOPERATIVE CHOLANGIOGRAM;  Surgeon: Christene Lye, MD;  Location: [facility] ORS;  Service: General;  Laterality: N/A;  . COLONOSCOPY WITH PROPOFOL N/A 04/27/2016   Procedure: COLONOSCOPY WITH PROPOFOL;  Surgeon: [name], MD;  Location: [place];  Service: Endoscopy;  Laterality: N/A;  . CORONARY ANGIOPLASTY    . CORONARY BALLOON ANGIOPLASTY N/A 06/29/2017   Procedure: CORONARY BALLOON ANGIOPLASTY;  Surgeon: Wellington [place], MD;  Location: [place] CV LAB;  Service: Cardiovascular;  Laterality: N/A;  . CORONARY BALLOON ANGIOPLASTY N/A 09/20/2017   Procedure: CORONARY BALLOON ANGIOPLASTY;  Surgeon: Wellington [place], MD;  Location: [place] CV LAB;  Service: Cardiovascular;  Laterality: N/A;  . DILATION AND CURETTAGE OF UTERUS    . ESOPHAGOGASTRODUODENOSCOPY (EGD) WITH PROPOFOL N/A 09/14/2014   Procedure: ESOPHAGOGASTRODUODENOSCOPY (EGD) WITH PROPOFOL;  Surgeon: Josefine Class, MD;  Location: [facility] ENDOSCOPY;  Service: Endoscopy;  Laterality: N/A;  . ESOPHAGOGASTRODUODENOSCOPY (EGD) WITH PROPOFOL N/A 04/27/2016   Procedure: ESOPHAGOGASTRODUODENOSCOPY (  EGD) WITH PROPOFOL;  Surgeon: [name], MD;  Location: [place];  Service: Endoscopy;  Laterality: N/A;  Diabetic - oral meds  . GASTRIC BYPASS  09/2009   [facility]   . Left Carotid to sublcavian artery bypass w/ subclavian artery ligation     a. Performed @ [facility].  . LEFT HEART CATH AND CORONARY ANGIOGRAPHY Left 06/29/2017   Procedure: LEFT HEART CATH AND CORONARY ANGIOGRAPHY;  Surgeon: Wellington [place], MD;  Location: [place] CV LAB;  Service: Cardiovascular;  Laterality: Left;  . LEFT HEART CATH AND CORONARY ANGIOGRAPHY N/A 09/20/2017   Procedure: LEFT HEART CATH AND CORONARY ANGIOGRAPHY;  Surgeon: Wellington [place], MD;   Location: [place] CV LAB;  Service: Cardiovascular;  Laterality: N/A;  . MELANOMA EXCISION  2016   Dr. Evorn Gong  . [place]  2002  . RIGHT OOPHORECTOMY    . SHOULDER ARTHROSCOPY WITH OPEN ROTATOR CUFF REPAIR Right 01/07/2016   Procedure: SHOULDER ARTHROSCOPY WITH DEBRIDMENT, SUBACHROMIAL DECOMPRESSION;  Surgeon: Corky Mull, MD;  Location: [facility] ORS;  Service: Orthopedics;  Laterality: Right;  . SHOULDER ARTHROSCOPY WITH OPEN ROTATOR CUFF REPAIR Right 03/16/2017   Procedure: SHOULDER ARTHROSCOPY WITH OPEN ROTATOR CUFF REPAIR POSSIBLE BICEPS TENODESIS;  Surgeon: Corky Mull, MD;  Location: [facility] ORS;  Service: Orthopedics;  Laterality: Right;  . TRIGGER FINGER RELEASE Right     [place]    Prior to Admission medications   Medication Sig Start Date End Date Taking? Authorizing Provider  ALPRAZolam Duanne Moron) 1 MG tablet Take 1 tablet (1 mg total) by mouth daily as needed for anxiety. 08/18/17   [name], MD  amLODipine (NORVASC) 5 MG tablet Take 1 tablet (5 mg total) by mouth daily. 11/01/17   Minna Merritts, MD  aspirin 81 MG chewable tablet Chew 1 tablet (81 mg total) by mouth daily. 07/01/17   Dunn, Areta Haber, PA-C  Blood Glucose Monitoring Suppl (ONE TOUCH ULTRA 2) w/Device KIT Use to check blood sugar once a day. Dx. E11.9 03/18/17   [name], MD  buPROPion (WELLBUTRIN XL) 150 MG 24 hr tablet TAKE 1 TABLET BY MOUTH DAILY 08/10/17   [name], MD  Calcium Carbonate-Vitamin D (CALCIUM 500/D PO) Take 500 mg by mouth daily.     [provider]  calcium-vitamin D (OSCAL 500/200 D-3) 500-200 MG-UNIT tablet Take 1 tablet by mouth 2 (two) times daily. 11/04/17 11/04/18  Taheera Thomann, Charline Bills, PA-C  celecoxib (CELEBREX) 200 MG capsule Take 200 mg by mouth 2 (two) times daily as needed for moderate pain.     [provider]  citalopram (CELEXA) 40 MG tablet Take 1 tablet (40 mg total) by mouth daily. 06/08/17   [name], MD  clopidogrel (PLAVIX)  75 MG tablet Take 1 tablet (75 mg total) by mouth daily with breakfast. 07/01/17   Dunn, Areta Haber, PA-C  Cyanocobalamin (VITAMIN B 12 PO) Take 1,000 mcg by mouth daily.     [provider]  cyclobenzaprine (FLEXERIL) 10 MG tablet Take 1 tablet (10 mg total) by mouth 2 (two) times daily as needed for muscle spasms. 10/30/17   Duanne Guess, PA-C  gabapentin (NEURONTIN) 300 MG capsule TAKE 1 CAPSULE BY MOUTH TWICE A DAY 10/30/17   [name], MD  HYDROcodone-acetaminophen (NORCO/VICODIN) 5-325 MG tablet Take 1 tablet by mouth every 4 (four) hours as needed for moderate pain. 11/04/17   Roma Bondar, Charline Bills, PA-C  hyoscyamine (LEVSIN SL)  0.125 MG SL tablet Place 1 tablet (0.125 mg total) under the tongue every 6 (six) hours as needed. Patient taking differently: Place 0.125 mg under the tongue every 6 (six) hours as needed for cramping.  02/01/17   [name], MD  isosorbide mononitrate (IMDUR) 120 MG 24 hr tablet Take 1 tablet (120 mg total) by mouth daily. 10/13/17   Rise Mu, PA-C  levothyroxine (SYNTHROID, LEVOTHROID) 25 MCG tablet Take 1 tablet (25 mcg total) daily before breakfast by mouth. 12/18/16   [name], MD  losartan (COZAAR) 25 MG tablet Take 1/2 tablet (12.5 mg) by mouth once daily 10/13/17   Rise Mu, PA-C  magnesium oxide (MAG-OX) 400 MG tablet Take 1 tablet (400 mg total) by mouth daily. 07/12/17   Dunn, Areta Haber, PA-C  methocarbamol (ROBAXIN) 500 MG tablet Take 1 tablet (500 mg total) by mouth 4 (four) times daily. 11/04/17   Ula Couvillon, Charline Bills, PA-C  metoprolol succinate (TOPROL-XL) 50 MG 24 hr tablet Take 1 tablet (50 mg total) by mouth daily. Take with or immediately following a meal. 07/01/17   Dunn, Areta Haber, PA-C  Multiple Vitamin (MULTIVITAMIN WITH MINERALS) TABS tablet Take 1 tablet by mouth daily. 04/14/17   Gouru, Illene Silver, MD  nitroGLYCERIN (NITROSTAT) 0.4 MG SL tablet Place 1 tablet (0.4 mg total) under the tongue every 5 (five) minutes as needed for  chest pain. 06/02/17   Minna Merritts, MD  oxyCODONE-acetaminophen (PERCOCET/ROXICET) 5-325 MG tablet Take 1 tablet by mouth every 6 (six) hours as needed for severe pain. 10/30/17   Duanne Guess, PA-C  pantoprazole (PROTONIX) 40 MG tablet Take 1 tablet (40 mg total) by mouth 2 (two) times daily. 05/11/16   [name], MD  pioglitazone (ACTOS) 15 MG tablet Take 15 mg by mouth daily.  08/03/17 08/03/18  [provider]  predniSONE (DELTASONE) 50 MG tablet Take 1 tablet (50 mg total) by mouth daily with breakfast. 11/04/17   Rini Moffit, Charline Bills, PA-C  promethazine (PHENERGAN) 25 MG tablet Take 1 tablet (25 mg total) by mouth every 6 (six) hours as needed for nausea or vomiting. 10/20/17   [name], MD  pyridostigmine (MESTINON) 60 MG tablet Take 60 mg by mouth 3 (three) times daily.  07/26/17   [provider]  rosuvastatin (CRESTOR) 10 MG tablet Take 1 tablet (10 mg total) by mouth daily. 08/10/17   Minna Merritts, MD    Allergies Lipitor [atorvastatin] and Tramadol  Family History  Problem Relation Age of Onset  . Hypertension Mother   . Anxiety disorder Mother   . Depression Mother   . Bipolar disorder Mother   . Heart disease Mother        No details  . Hyperlipidemia Mother   . Kidney disease Father   . Heart disease Father 29  . Hypertension Father   . Diabetes Father   . Stroke Father   . Colon cancer Father        dx in his 63's  . Anxiety disorder Father   . Depression Father   . Skin cancer Father   . Kidney disease Sister   . Thyroid nodules Sister   . Hypertension Sister   . Hypertension Sister   . Diabetes Sister   . Hyperlipidemia Sister   . Depression Sister   . Breast cancer Maternal Aunt 50  . Breast cancer Maternal Aunt 47  . Ovarian cancer Cousin   . Colon cancer Cousin   .  Breast cancer Other   . Kidney cancer Neg Hx   . Bladder Cancer Neg Hx     Social History Social History   Tobacco Use  . Smoking status: Former  Smoker    Types: Cigarettes    Last attempt to quit: 08/31/1994    Years since quitting: 23.1  . Smokeless tobacco: Never Used  . Tobacco comment: quit 28 years ago  Substance Use Topics  . Alcohol use: No    Alcohol/week: 0.0 standard drinks  . Drug use: No     Review of Systems  Constitutional: No fever/chills Eyes: No visual changes. ENT: No upper respiratory complaints. Cardiovascular: no chest pain. Respiratory: no cough. No SOB. Gastrointestinal: No abdominal pain.  No nausea, no vomiting.  No diarrhea.  No constipation.  Positive for bowel incontinence. Genitourinary: Negative for dysuria. No hematuria.  Negative for urinary incontinence or urinary retention. Musculoskeletal: Positive for increasing lower back pain, paresthesias of the left lower extremity. Skin: Negative for rash, abrasions, lacerations, ecchymosis. Neurological: Positive for headache but denies focal weakness or numbness.  Patient does state that she has had some incoordination with her upper extremities either missing objects she is reaching for or knocking them over. 10-point ROS otherwise negative.  ____________________________________________   PHYSICAL EXAM:  VITAL SIGNS: ED Triage Vitals [11/04/17 1455]  Enc Vitals Group     BP 99/61     Pulse Rate 92     Resp 18     Temp 98.7 F (37.1 C)     Temp Source Oral     SpO2 96 %     Weight 170 lb (77.1 kg)     Height '5\' 2"'  (1.575 m)     Head Circumference      Peak Flow      Pain Score 10     Pain Loc      Pain Edu?      Excl. in [place]?      Constitutional: Alert and oriented. Well appearing and in no acute distress. Eyes: Conjunctivae are normal. PERRL. EOMI. Head: Mild hematoma to the right occipital/basilar skull.  No visible abrasions or lacerations to this area.  No battle signs, raccoon eyes, serosanguineous fluid drainage from the ears or nares. ENT:      Ears:       Nose: No congestion/rhinnorhea.      Mouth/Throat: Mucous  membranes are moist.  Neck: No stridor.  No cervical spine tenderness to palpation.  Neck is supple full range of motion.  Radial pulse intact bilateral upper extremities.  Sensation intact and equal in all dermatomal distributions bilateral upper extremities. Cardiovascular: Normal rate, regular rhythm. Normal S1 and S2.  Good peripheral circulation. Respiratory: Normal respiratory effort without tachypnea or retractions. Lungs CTAB. Good air entry to the bases with no decreased or absent breath sounds. Gastrointestinal: Bowel sounds 4 quadrants. Soft and nontender to palpation. No guarding or rigidity. No palpable masses. No distention. No CVA tenderness. Musculoskeletal: Full range of motion to all extremities. No gross deformities appreciated.  Visualization of the lumbar and thoracic spine reveals no visible signs of trauma with abrasions, lacerations, ecchymosis.  No visible deformity.  No tenderness to palpation in the upper thoracic spine.  Over T11-T12, mild tenderness.  No palpable abnormality or step-off.  Diffuse tenderness throughout the lumbar region without specific point tenderness.  No palpable abnormality or deficit.  Tenderness to palpation left-sided sciatic notch.  No tenderness to palpation right side.  Positive straight leg raise  left side.  No loss of sensation in dermatomal distributions bilaterally on palpation.  Dorsalis pedis pulse intact bilateral lower extremities. Neurologic:  Normal speech and language. No gross focal neurologic deficits are appreciated.  Cranial nerves II through XII grossly intact.  Positive for dysmetria bilaterally. Skin:  Skin is warm, dry and intact. No rash noted. Psychiatric: Mood and affect are normal. Speech and behavior are normal. Patient exhibits appropriate insight and judgement.   ____________________________________________   LABS (all labs ordered are listed, but only abnormal results are displayed)  Labs Reviewed  COMPREHENSIVE  METABOLIC PANEL - Abnormal; Notable for the following components:      Result Value   CO2 19 (*)    BUN 27 (*)    Creatinine, Ser 2.67 (*)    Calcium 7.7 (*)    Total Protein 6.3 (*)    Alkaline Phosphatase 128 (*)    GFR calc non Af Amer 20 (*)    GFR calc Af Amer 23 (*)    All other components within normal limits  CBC WITH DIFFERENTIAL/PLATELET - Abnormal; Notable for the following components:   Hemoglobin 11.0 (*)    HCT 32.4 (*)    Neutro Abs 8.2 (*)    Lymphs Abs 0.5 (*)    All other components within normal limits  URINALYSIS, COMPLETE (UACMP) WITH MICROSCOPIC  POC URINE PREG, ED  POCT PREGNANCY, URINE   ____________________________________________  EKG   ____________________________________________  RADIOLOGY I personally viewed and evaluated these images as part of my medical decision making, as well as reviewing the written report by the radiologist.  Dg Thoracic Spine 2 View  Result Date: 11/04/2017 CLINICAL DATA:  [place] from porch 1 week ago. Persistent thoracic back pain. EXAM: THORACIC SPINE 2 VIEWS COMPARISON:  03/18/2017.  CT chest 05/31/2015 FINDINGS: No evidence of thoracic region fracture. Posteromedial ribs appear normal. Surgical clips present in the upper mediastinum and thoracic inlet region. IMPRESSION: No traumatic finding. Electronically Signed   By: Nelson Chimes M.D.   On: 11/04/2017 16:32   Dg Lumbar Spine Complete  Result Date: 11/04/2017 CLINICAL DATA:  [place] 1 week ago.  Left-sided back pain. EXAM: LUMBAR SPINE - COMPLETE 4+ VIEW COMPARISON:  10/30/2017 FINDINGS: Five lumbar type vertebral bodies show normal alignment. No evidence of lumbar region fracture. No significant disc space narrowing. Bridging endplate osteophytes incidentally noted. Some lower lumbar facet osteoarthritis. IMPRESSION: No acute or traumatic finding. Mild degenerative changes as above, unchanged from previous studies. Electronically Signed   By: Nelson Chimes M.D.   On:  11/04/2017 16:33   Ct Head Wo Contrast  Result Date: 11/04/2017 CLINICAL DATA:  [place] and hit head 6 days ago. Persistent headache and dizziness. EXAM: CT HEAD WITHOUT CONTRAST CT CERVICAL SPINE WITHOUT CONTRAST TECHNIQUE: Multidetector CT imaging of the head and cervical spine was performed following the standard protocol without intravenous contrast. Multiplanar CT image reconstructions of the cervical spine were also generated. COMPARISON:  CT HEAD October 14, 2017, CT chest May 31, 2015 FINDINGS: CT HEAD FINDINGS BRAIN: No intraparenchymal hemorrhage, mass effect nor midline shift. Bilateral basal ganglia and bilateral thalami lacunar infarcts. Small area LEFT parietal encephalomalacia. Patchy supratentorial and pontine white matter hypodensities. The ventricles and sulci are normal. No acute large vascular territory infarcts. No abnormal extra-axial fluid collections. Basal cisterns are patent. VASCULAR: Moderate calcific atherosclerosis. SKULL/SOFT TISSUES: No skull fracture. No significant soft tissue swelling. ORBITS/SINUSES: The included ocular globes and orbital contents are normal.Trace paranasal sinus mucosal thickening. Mastoid air  cells are well aerated. OTHER: None. CT CERVICAL SPINE FINDINGS ALIGNMENT: Straightened lordosis.  Vertebral bodies in alignment. SKULL BASE AND VERTEBRAE: Cervical vertebral bodies and posterior elements are intact. Intervertebral disc heights preserved, bulky ventral osteophyte seen with DISH. No destructive bony lesions. C1-2 articulation maintained, moderate osteoarthrosis. SOFT TISSUES AND SPINAL CANAL: Nonacute. Mild calcific atherosclerosis RIGHT carotid siphon. DISC LEVELS: No significant osseous canal stenosis or neural foraminal narrowing. UPPER CHEST: Lung apices are clear. RIGHT-sided aortic arch with closure device. OTHER: None. IMPRESSION: CT HEAD: 1. No acute intracranial process. 2. Moderate chronic small vessel ischemic changes and old lacunar  infarcts. 3. Old small LEFT parietal/MCA territory infarct. 4. Moderate intracranial atherosclerosis. CT CERVICAL SPINE: 1. No fracture or malalignment. Electronically Signed   By: Elon Alas M.D.   On: 11/04/2017 16:08   Ct Cervical Spine Wo Contrast  Result Date: 11/04/2017 CLINICAL DATA:  [place] and hit head 6 days ago. Persistent headache and dizziness. EXAM: CT HEAD WITHOUT CONTRAST CT CERVICAL SPINE WITHOUT CONTRAST TECHNIQUE: Multidetector CT imaging of the head and cervical spine was performed following the standard protocol without intravenous contrast. Multiplanar CT image reconstructions of the cervical spine were also generated. COMPARISON:  CT HEAD October 14, 2017, CT chest May 31, 2015 FINDINGS: CT HEAD FINDINGS BRAIN: No intraparenchymal hemorrhage, mass effect nor midline shift. Bilateral basal ganglia and bilateral thalami lacunar infarcts. Small area LEFT parietal encephalomalacia. Patchy supratentorial and pontine white matter hypodensities. The ventricles and sulci are normal. No acute large vascular territory infarcts. No abnormal extra-axial fluid collections. Basal cisterns are patent. VASCULAR: Moderate calcific atherosclerosis. SKULL/SOFT TISSUES: No skull fracture. No significant soft tissue swelling. ORBITS/SINUSES: The included ocular globes and orbital contents are normal.Trace paranasal sinus mucosal thickening. Mastoid air cells are well aerated. OTHER: None. CT CERVICAL SPINE FINDINGS ALIGNMENT: Straightened lordosis.  Vertebral bodies in alignment. SKULL BASE AND VERTEBRAE: Cervical vertebral bodies and posterior elements are intact. Intervertebral disc heights preserved, bulky ventral osteophyte seen with DISH. No destructive bony lesions. C1-2 articulation maintained, moderate osteoarthrosis. SOFT TISSUES AND SPINAL CANAL: Nonacute. Mild calcific atherosclerosis RIGHT carotid siphon. DISC LEVELS: No significant osseous canal stenosis or neural foraminal narrowing.  UPPER CHEST: Lung apices are clear. RIGHT-sided aortic arch with closure device. OTHER: None. IMPRESSION: CT HEAD: 1. No acute intracranial process. 2. Moderate chronic small vessel ischemic changes and old lacunar infarcts. 3. Old small LEFT parietal/MCA territory infarct. 4. Moderate intracranial atherosclerosis. CT CERVICAL SPINE: 1. No fracture or malalignment. Electronically Signed   By: Elon Alas M.D.   On: 11/04/2017 16:08   Mr Lumbar Spine Wo Contrast  Result Date: 11/04/2017 CLINICAL DATA:  Low back pain and tightness.  Fell 6 days ago. EXAM: MRI LUMBAR SPINE WITHOUT CONTRAST TECHNIQUE: Multiplanar, multisequence MR imaging of the lumbar spine was performed. No intravenous contrast was administered. COMPARISON:  Lumbar spine radiographs November 04, 2017 FINDINGS: SEGMENTATION: For the purposes of this report, the last well-formed intervertebral disc is reported as L5-S1. ALIGNMENT: Maintained lumbar lordosis. No malalignment. VERTEBRAE:Vertebral bodies are intact. Lumbar disc heights maintained with mild disc desiccation most apparent at L4-5 and L5-S1. Multilevel mild subacute and chronic discogenic endplate changes. Old Schmorl's nodes. No acute or abnormal bone marrow signal. CONUS MEDULLARIS AND CAUDA EQUINA: Conus medullaris terminates at L1 and demonstrates normal morphology and signal characteristics. Cauda equina is normal. PARASPINAL AND OTHER SOFT TISSUES: Nonacute. Mild symmetric paraspinal muscle atrophy. Colonic diverticulosis. DISC LEVELS: T12-L1: Annular bulging. No canal stenosis or neural foraminal narrowing. L1-2 through  L3-4: No disc bulge, canal stenosis nor neural foraminal narrowing. L4-5: Small central disc extrusion with 13 mm superior and inferior contiguous migration, undersurface annular fissure. Encroachment upon the traversing L5 nerves within lateral recess. Minimal facet arthropathy. Mild canal stenosis. Minimal neural foraminal narrowing. L5-S1: Small broad-based  disc bulge. Mild facet arthropathy. No canal stenosis. Mild bilateral neural foraminal narrowing. IMPRESSION: 1. No fracture or malalignment.  No acute osseous process. 2. Small L4-5 disc extrusion could affect the traversing L5 nerves. 3. Mild canal stenosis L4-5. Minimal to mild L4-5 and L5-S1 neural foraminal narrowing. Electronically Signed   By: Elon Alas M.D.   On: 11/04/2017 19:13    ____________________________________________    PROCEDURES  Procedure(s) performed:    Procedures    Medications  morphine 4 MG/ML injection 4 mg (4 mg Intravenous Given 11/04/17 1627)  ondansetron (ZOFRAN) injection 4 mg (4 mg Intravenous Given 11/04/17 1631)  sodium chloride 0.9 % bolus 1,000 mL (1,000 mLs Intravenous New Bag/Given 11/04/17 1954)     ____________________________________________   INITIAL IMPRESSION / ASSESSMENT AND PLAN / ED COURSE  Pertinent labs & imaging results that were available during my care of the patient were reviewed by me and considered in my medical decision making (see chart for details).  Review of the [place] CSRS was performed in accordance of the [place] prior to dispensing any controlled drugs.  Clinical Course as of Nov 05 2139  Thu Nov 04, 2017  1632 Patient presented to the emergency department with worsening symptoms after a fall 6 days ago.  Patient was initially evaluated at urgent care, negative x-rays of the lumbar spine, hip.  Patient did have a finger fracture to the left hand.  Patient is now endorsing headache, dizziness, worsening lower back pain, bowel incontinence.  On exam, exam was overall reassuring.  Patient did have positive dysmetria, however cranial nerve testing was otherwise unremarkable.  Sensation intact in all dermatomal distributions upper and lower extremity.  No saddle anesthesia.  Patient was evaluated with CT scan of the head, neck, x-rays of the thoracic and lumbar spine.  I discussed the case with attending provider, Dr. Archie Balboa  and at this time, MRI of the lumbar spine is reasonable given reported bowel incontinence, however MRI of the brain, cervical or thoracic spine is deemed unnecessary at this time.   [JC]  1950 I discussed the patient's laboratory findings with hospitalist service for possible admission for acute kidney injury.  Hospitalist service recommends giving patient 1 L of fluid, follow-up with nephrologist.  I discussed recommendation with the patient, as well as asking for any further symptoms.  Patient was only complaining of lower back pain at this time.  She denies any dizziness, lightheadedness, nausea.  Patient reports that she is agreeable to receiving IV fluids, following up with her nephrologist.  She reports that she has appointment with nephrology at the end of the week.  I have discussed reasons to return to include weakness, dizziness, syncope, inability to maintain good oral hydration.  Patient verbalizes understanding of same.   [JC]    Clinical Course User Index [JC] Shyne Resch, Charline Bills, PA-C     Patient's diagnosis is consistent with acute midline low back pain, stage III chronic kidney disease, acute kidney injury, hypercalcemia.  Patient presented to the emergency department after a fall 6 days ago.  Patient was originally evaluated at urgent care, negative imaging.  Patient reports increasing pain to the lower back, as well as headache and dizziness after striking  her head.  Imaging returned with reassuring results.  No indication for acute spinal cord injury on MRI.  Patient had been endorsing bowel incontinence.  No bowel incontinence in the emergency department.  I recommend follow-up with orthopedics or neurosurgery if symptoms persist.  Incidentally, on lab work patient had acute kidney injury.  I discussed the findings with hospitalist for possible admission, at this time, hospitalist recommends fluids and discharged with follow-up with nephrology.  I discussed the findings with the  patient who verbalizes understanding of lab results.  Patient was given bolus of saline.  She states that she has a nephrology appointment within the next several days.  I advised patient to call nephrologist in the morning to discuss lab results.  She verbalizes understanding and compliance with same is that the dental pain.  I discussed symptoms to return emergently to the emergency department for.  She verbalizes understanding and agreement with same.  Patient's main concern, complaint at this time is her back pain.  She will be prescribed a short course of steroid for inflammation control she is unable to take NSAIDs due to kidney function and history of gastric ulcer.  Patient will be given muscle relaxer for additional symptom relief.  Limited prescription pain medication is also present bided.  Given hypocalcemia which is likely secondary to kidney function, patient will be placed on vitamin D and calcium supplement. Patient is given ED precautions to return to the ED for any worsening or new symptoms.     ____________________________________________  FINAL CLINICAL IMPRESSION(S) / ED DIAGNOSES  Final diagnoses:  Acute midline low back pain with left-sided sciatica  Stage 3 chronic kidney disease ([place])  Acute kidney injury ([place])  Hypocalcemia      NEW MEDICATIONS STARTED DURING THIS VISIT:  ED Discharge Orders         Ordered    predniSONE (DELTASONE) 50 MG tablet  Daily with breakfast     11/04/17 2140    methocarbamol (ROBAXIN) 500 MG tablet  4 times daily     11/04/17 2140    HYDROcodone-acetaminophen (NORCO/VICODIN) 5-325 MG tablet  Every 4 hours PRN     11/04/17 2140    calcium-vitamin D (OSCAL 500/200 D-3) 500-200 MG-UNIT tablet  2 times daily     11/04/17 2140              This chart was dictated using voice recognition software/Dragon. Despite best efforts to proofread, errors can occur which can change the meaning. Any change was purely unintentional.     Darletta Moll, PA-C 11/04/17 2142    Eula Listen, MD 11/05/17 [phone]

## 2017-11-08 DIAGNOSIS — E669 Obesity, unspecified: Secondary | ICD-10-CM | POA: Diagnosis not present

## 2017-11-08 DIAGNOSIS — E039 Hypothyroidism, unspecified: Secondary | ICD-10-CM | POA: Diagnosis not present

## 2017-11-08 DIAGNOSIS — Z9884 Bariatric surgery status: Secondary | ICD-10-CM | POA: Diagnosis not present

## 2017-11-08 DIAGNOSIS — I10 Essential (primary) hypertension: Secondary | ICD-10-CM | POA: Diagnosis not present

## 2017-11-08 DIAGNOSIS — E1142 Type 2 diabetes mellitus with diabetic polyneuropathy: Secondary | ICD-10-CM | POA: Diagnosis not present

## 2017-11-08 DIAGNOSIS — E559 Vitamin D deficiency, unspecified: Secondary | ICD-10-CM | POA: Diagnosis not present

## 2017-11-08 DIAGNOSIS — E782 Mixed hyperlipidemia: Secondary | ICD-10-CM | POA: Diagnosis not present

## 2017-11-08 DIAGNOSIS — E1159 Type 2 diabetes mellitus with other circulatory complications: Secondary | ICD-10-CM | POA: Diagnosis not present

## 2017-11-08 DIAGNOSIS — Z79899 Other long term (current) drug therapy: Secondary | ICD-10-CM | POA: Diagnosis not present

## 2017-11-09 DIAGNOSIS — S62653A Nondisplaced fracture of medial phalanx of left middle finger, initial encounter for closed fracture: Secondary | ICD-10-CM | POA: Diagnosis not present

## 2017-11-09 DIAGNOSIS — W0110XA Fall on same level from slipping, tripping and stumbling with subsequent striking against unspecified object, initial encounter: Secondary | ICD-10-CM | POA: Diagnosis not present

## 2017-11-09 DIAGNOSIS — W548XXA Other contact with dog, initial encounter: Secondary | ICD-10-CM | POA: Diagnosis not present

## 2017-11-09 DIAGNOSIS — M25542 Pain in joints of left hand: Secondary | ICD-10-CM | POA: Diagnosis not present

## 2017-11-11 DIAGNOSIS — N184 Chronic kidney disease, stage 4 (severe): Secondary | ICD-10-CM | POA: Diagnosis not present

## 2017-11-11 DIAGNOSIS — Z8673 Personal history of transient ischemic attack (TIA), and cerebral infarction without residual deficits: Secondary | ICD-10-CM | POA: Diagnosis not present

## 2017-11-11 DIAGNOSIS — N179 Acute kidney failure, unspecified: Secondary | ICD-10-CM | POA: Diagnosis not present

## 2017-11-11 DIAGNOSIS — E1122 Type 2 diabetes mellitus with diabetic chronic kidney disease: Secondary | ICD-10-CM | POA: Diagnosis not present

## 2017-11-11 DIAGNOSIS — Z800 Family history of malignant neoplasm of digestive organs: Secondary | ICD-10-CM | POA: Diagnosis not present

## 2017-11-11 DIAGNOSIS — K529 Noninfective gastroenteritis and colitis, unspecified: Secondary | ICD-10-CM | POA: Diagnosis not present

## 2017-11-11 DIAGNOSIS — R933 Abnormal findings on diagnostic imaging of other parts of digestive tract: Secondary | ICD-10-CM | POA: Diagnosis not present

## 2017-11-11 DIAGNOSIS — N2581 Secondary hyperparathyroidism of renal origin: Secondary | ICD-10-CM | POA: Diagnosis not present

## 2017-11-11 DIAGNOSIS — R1031 Right lower quadrant pain: Secondary | ICD-10-CM | POA: Diagnosis not present

## 2017-11-11 DIAGNOSIS — R1312 Dysphagia, oropharyngeal phase: Secondary | ICD-10-CM | POA: Diagnosis not present

## 2017-11-11 DIAGNOSIS — I10 Essential (primary) hypertension: Secondary | ICD-10-CM | POA: Diagnosis not present

## 2017-11-11 DIAGNOSIS — Z8371 Family history of colonic polyps: Secondary | ICD-10-CM | POA: Diagnosis not present

## 2017-11-11 DIAGNOSIS — R1032 Left lower quadrant pain: Secondary | ICD-10-CM | POA: Diagnosis not present

## 2017-11-11 DIAGNOSIS — K219 Gastro-esophageal reflux disease without esophagitis: Secondary | ICD-10-CM | POA: Diagnosis not present

## 2017-11-12 ENCOUNTER — Telehealth: Payer: Self-pay

## 2017-11-12 ENCOUNTER — Other Ambulatory Visit: Payer: Self-pay | Admitting: Student

## 2017-11-12 DIAGNOSIS — R1312 Dysphagia, oropharyngeal phase: Secondary | ICD-10-CM

## 2017-11-12 NOTE — Telephone Encounter (Addendum)
   [facility] Pre-operative Risk Assessment    Request for surgical clearance:  1. What type of surgery is being performed? Colonoscopy and Upper Endoscopy   2. When is this surgery scheduled? 01/24/18    3. What type of clearance is required (medical clearance vs. Pharmacy clearance to hold med vs. Both)? Both  4. Are there any medications that need to be held prior to surgery and how long? Plavix x 5 days and Aspirin 81 mg will only be held the morning of the procedure.  5. Practice name and name of physician performing surgery? [facility] Tammi Klippel PA-C.  6. What is your office phone number [phone]    7.   What is your office fax number  [phone]  8.   Anesthesia type (None, local, MAC, general) ? Monitored Anesthesia   [name] 11/12/2017, 10:26 AM  _________________________________________________________________ Please ensure to send recent visit note with signed clearance

## 2017-11-12 NOTE — Telephone Encounter (Signed)
Can pt hold her plavix for 5 days and her ASA for 1 day for colonoscopy?  Please respond to pre-op pool thanks.

## 2017-11-16 ENCOUNTER — Telehealth: Payer: Self-pay | Admitting: Internal Medicine

## 2017-11-16 NOTE — Telephone Encounter (Signed)
Is this an elective procedure?

## 2017-11-16 NOTE — Telephone Encounter (Signed)
Pt c/o of Chest Pain: STAT if CP now or developed within 24 hours  1. Are you having CP right now?  Yes   2. Are you experiencing any other symptoms (ex. SOB, nausea, vomiting, sweating)? Sweating sob   3. How long have you been experiencing CP? 1 1/2 weeks   4. Is your CP continuous or coming and going? Comes and goes   5. Have you taken Nitroglycerin? Yes  ? Patient doesn't want to go to ED if she can avoid it .  Wants to be seen today if possible in office.

## 2017-11-16 NOTE — Telephone Encounter (Signed)
No answer. Left message to call back.  Dr Caryl Comes has opening today at 10:45am.

## 2017-11-17 ENCOUNTER — Other Ambulatory Visit
Admission: RE | Admit: 2017-11-17 | Discharge: 2017-11-17 | Disposition: A | Discharge status: Home / Self Care | Payer: Medicare Other | Source: Ambulatory Visit | Attending: Student | Admitting: Student

## 2017-11-17 DIAGNOSIS — K529 Noninfective gastroenteritis and colitis, unspecified: Secondary | ICD-10-CM | POA: Diagnosis not present

## 2017-11-17 LAB — C DIFFICILE QUICK SCREEN W PCR REFLEX
C DIFFICILE (CDIFF) TOXIN: NEGATIVE
C Diff antigen: NEGATIVE
C Diff interpretation: NOT DETECTED

## 2017-11-17 LAB — GASTROINTESTINAL PANEL BY PCR, STOOL (REPLACES STOOL CULTURE)
ADENOVIRUS F40/41: NOT DETECTED
Astrovirus: NOT DETECTED
CRYPTOSPORIDIUM: NOT DETECTED
Campylobacter species: NOT DETECTED
Cyclospora cayetanensis: NOT DETECTED
ENTEROAGGREGATIVE E COLI (EAEC): NOT DETECTED
Entamoeba histolytica: NOT DETECTED
Enteropathogenic E coli (EPEC): NOT DETECTED
Enterotoxigenic E coli (ETEC): NOT DETECTED
GIARDIA LAMBLIA: NOT DETECTED
Norovirus GI/GII: NOT DETECTED
Plesimonas shigelloides: NOT DETECTED
ROTAVIRUS A: NOT DETECTED
SALMONELLA SPECIES: NOT DETECTED
Sapovirus (I, II, IV, and V): NOT DETECTED
Shiga like toxin producing E coli (STEC): NOT DETECTED
Shigella/Enteroinvasive E coli (EIEC): NOT DETECTED
VIBRIO CHOLERAE: NOT DETECTED
Vibrio species: NOT DETECTED
YERSINIA ENTEROCOLITICA: NOT DETECTED

## 2017-11-18 ENCOUNTER — Encounter: Payer: Self-pay | Admitting: Internal Medicine

## 2017-11-18 ENCOUNTER — Ambulatory Visit (INDEPENDENT_AMBULATORY_CARE_PROVIDER_SITE_OTHER): Payer: Medicare Other | Admitting: Internal Medicine

## 2017-11-18 VITALS — BP 132/98 | Ht 62.0 in | Wt 177.5 lb

## 2017-11-18 DIAGNOSIS — I25118 Atherosclerotic heart disease of native coronary artery with other forms of angina pectoris: Secondary | ICD-10-CM

## 2017-11-18 DIAGNOSIS — I951 Orthostatic hypotension: Secondary | ICD-10-CM

## 2017-11-18 DIAGNOSIS — I208 Other forms of angina pectoris: Secondary | ICD-10-CM | POA: Diagnosis not present

## 2017-11-18 DIAGNOSIS — I200 Unstable angina: Secondary | ICD-10-CM

## 2017-11-18 DIAGNOSIS — R002 Palpitations: Secondary | ICD-10-CM

## 2017-11-18 MED ORDER — AMLODIPINE BESYLATE 5 MG PO TABS: 7.5000 mg | ORAL_TABLET | Freq: Every day | ORAL | 6 refills | Status: DC

## 2017-11-18 NOTE — Telephone Encounter (Signed)
Received pt's progress note and gave to Robbie Lis, PA-C for review.

## 2017-11-18 NOTE — Telephone Encounter (Signed)
Please ask surgeon's office and/or patient if these are elective procedure and forward to Dr. Felipa Emory to review.   Per Dr. Olin Pia note today "Chest pain may well be anginal".

## 2017-11-18 NOTE — Telephone Encounter (Signed)
Personally reviewed with Dr. Caryl Comes personally, Patient is having more chest pain and recommended holding GI eval but  wants to defer final clearance to Dr. [name]  Per GI note, "Patient has abnormal abdominal CT in 01/2017. Recommended colonoscopy but cancelled due to multiple admission for acute renal failure, CVA etc". Now with worsen diarrhea.   I will route this information to Dr. [name] for review. Recommended discussing with GI directly for any additional information.

## 2017-11-18 NOTE — Progress Notes (Signed)
Patient Care Team: [name], MD as PCP - General (Family Medicine) [name] Kathlene November, MD as PCP - Cardiology (Cardiology) Minna Merritts, MD as Consulting Physician (Cardiology) Vladimir Crofts, MD as Consulting Physician (Neurology) Irene Shipper, MD as Consulting Physician (Gastroenterology) Caryn Section Kirstie Peri, MD as Referring Physician (Family Medicine) Christene Lye, MD (General Surgery)   HPI  [facility] [name] is a 47 y.o. female Seen in followup for orthostatic hypotension.  She has had no interval episodes of falling blood pressure and syncope.  Her interval fall was because she got tingled up with her dog  Admitted 3.19 with syncope    Found to be orthostatic ( BP sys 123>>>78// HR 73>> 99 ) ,  MRI>> "patchy acute v subacute infarcts" perhaps watershed 2/2 ? Orthostatic hypotension she had been referred for event recorder which I elected not to pursue  She has had significant problems with orthostatic intolerance, heat intolerance shower intolerance.  She is not sure whether this precedes or postdates her gastric bypass surgery undertaken 11 years ago. At the last visit we discussed the physiology and the value of pharmacological and nonpharmacological treatments   She has hx of CAD with prior stenting (See Below) she continues to struggle with chest pain.  Her episodes are typically lasting 10-30 minutes and taking 1-nitroglycerin.  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   DATE TEST EF   10/16 Echo   45-50 %   10/16 LHC   % LAD DES  5/17 LHC  D1 DES  2/19 Echo  60-65%   5/19 LHC  D2 POBA, dLAD 70%>> med Rx  8/19 LHC  D2 cutting balloon     She has spells suspicious for MS and MRI showed suspicious lesions, although spinal tap unrevealing        Past Medical History:  Diagnosis Date  . Anemia    iron  deficiency anemia  . 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 Bipolar disorder ([place])   . BRCA negative 2014  . CAD (coronary artery disease)    a. 08/2003 Cath: LAD 30-40-med Rx; b. 11/2014 PCI: LAD 59m(3.25x23 Xience Alpine DES); c. 06/2015 PCI: D1 (2.25x12 Resolute Integrity DES); d. 06/2017 PCI: Patent mLAD stent, D2 95 (PTCA); e. 09/2017 Cath/PCI: LM nl, LAD 329m70d, D1 40, D2 99ost (CBA), LCX 40p, RCA 40ost/p.  . Marland KitchenKD (chronic kidney disease), stage III ([place]  . Colon polyp   . CVA (cerebral vascular accident) ([facility]   Left side weakness.   . Diabetes ([facility]  . Family history of breast cancer    BRCA neg 2014  . Gastric ulcer 04/27/2011  . History of echocardiogram    a. 03/2017 Echo: EF 60-65%, no rwma.  . Marland KitchenTN (hypertension)   . Hyperlipemia   . Hypothyroid   . Malignant melanoma of skin of scalp ([facility]  . MI, acute, non ST segment elevation ([facility]  . Neuromuscular disorder ([facility]  . S/P drug eluting coronary stent placement 06/04/2015  . Sepsis ([place]2/14/2019    Past Surgical History:  Procedure Laterality Date  . APPENDECTOMY    . CARDIAC CATHETERIZATION N/A 11/09/2014   Procedure: Coronary Angiography;  Surgeon: TiMinna MerrittsMD;  [facility];  Service: Cardiovascular;  Laterality: N/A;  . CARDIAC CATHETERIZATION N/A  11/12/2014   Procedure: Coronary Stent Intervention;  Surgeon: Isaias Cowman, MD;  Location: [place] [facility];  Service: Cardiovascular;  Laterality: N/A;  . CARDIAC CATHETERIZATION N/A 04/18/2015   Procedure: Left Heart Cath and Coronary Angiography;  Surgeon: Minna Merritts, MD;  Location: [place] [facility];  Service: Cardiovascular;  Laterality: N/A;  . CARDIAC CATHETERIZATION Left 06/04/2015   Procedure: Left Heart Cath and Coronary Angiography;  Surgeon: Wellington Hampshire, MD;  Location: [place] [facility];  Service: Cardiovascular;  Laterality: Left;  . CARDIAC CATHETERIZATION N/A 06/04/2015   Procedure: Coronary Stent  Intervention;  Surgeon: Wellington Hampshire, MD;  Location: [place] [facility];  Service: Cardiovascular;  Laterality: N/A;  . CESAREAN SECTION  2001  . CHOLECYSTECTOMY N/A 11/18/2016   Procedure: LAPAROSCOPIC CHOLECYSTECTOMY WITH INTRAOPERATIVE CHOLANGIOGRAM;  Surgeon: Christene Lye, MD;  Location: [facility];  Service: General;  Laterality: N/A;  . COLONOSCOPY WITH PROPOFOL N/A 04/27/2016   Procedure: COLONOSCOPY WITH PROPOFOL;  Surgeon: [name], MD;  Location: [place];  Service: Endoscopy;  Laterality: N/A;  . CORONARY ANGIOPLASTY    . CORONARY BALLOON ANGIOPLASTY N/A 06/29/2017   Procedure: CORONARY BALLOON ANGIOPLASTY;  Surgeon: Wellington Hampshire, MD;  Location: [name] [facility];  Service: Cardiovascular;  Laterality: N/A;  . CORONARY BALLOON ANGIOPLASTY N/A 09/20/2017   Procedure: CORONARY BALLOON ANGIOPLASTY;  Surgeon: Wellington Hampshire, MD;  Location: [place] [facility];  Service: Cardiovascular;  Laterality: N/A;  . DILATION AND CURETTAGE OF UTERUS    . ESOPHAGOGASTRODUODENOSCOPY (EGD) WITH PROPOFOL N/A 09/14/2014   Procedure: ESOPHAGOGASTRODUODENOSCOPY (EGD) WITH PROPOFOL;  Surgeon: Josefine Class, MD;  [facility];  Service: Endoscopy;  Laterality: N/A;  . ESOPHAGOGASTRODUODENOSCOPY (EGD) WITH PROPOFOL N/A 04/27/2016   Procedure: ESOPHAGOGASTRODUODENOSCOPY (EGD) WITH PROPOFOL;  Surgeon: [name], MD;  Location: [place];  Service: Endoscopy;  Laterality: N/A;  Diabetic - oral meds  . GASTRIC BYPASS  09/2009   [facility]   . Left Carotid to sublcavian artery bypass w/ subclavian artery ligation     a. Performed @ [place].  . LEFT HEART CATH AND CORONARY ANGIOGRAPHY Left 06/29/2017   Procedure: LEFT HEART CATH AND CORONARY ANGIOGRAPHY;  Surgeon: Wellington Hampshire, MD;  Location: [place] [facility] LAB;  Service: Cardiovascular;  Laterality: Left;  . LEFT HEART CATH AND CORONARY ANGIOGRAPHY N/A 09/20/2017   Procedure:  LEFT HEART CATH AND CORONARY ANGIOGRAPHY;  Surgeon: Wellington Hampshire, MD;  Location: [place] [facility];  Service: Cardiovascular;  Laterality: N/A;  . MELANOMA EXCISION  2016   Dr. Evorn Gong  . [place]  2002  . RIGHT OOPHORECTOMY    . SHOULDER ARTHROSCOPY WITH OPEN ROTATOR CUFF REPAIR Right 01/07/2016   Procedure: SHOULDER ARTHROSCOPY WITH DEBRIDMENT, SUBACHROMIAL DECOMPRESSION;  Surgeon: Corky Mull, MD;  Location: [facility];  Service: Orthopedics;  Laterality: Right;  . SHOULDER ARTHROSCOPY WITH OPEN ROTATOR CUFF REPAIR Right 03/16/2017   Procedure: SHOULDER ARTHROSCOPY WITH OPEN ROTATOR CUFF REPAIR POSSIBLE BICEPS TENODESIS;  Surgeon: Corky Mull, MD;  Location: [facility];  Service: Orthopedics;  Laterality: Right;  . TRIGGER FINGER RELEASE Right     Middle Finger    Current Meds  Medication Sig  . ALPRAZolam (XANAX) 1 MG tablet Take 1 tablet (1 mg total) by mouth daily as needed for anxiety.  Marland Kitchen amLODipine (NORVASC) 5 MG tablet Take 1 tablet (5 mg total) by mouth daily.  Marland Kitchen aspirin 81 MG chewable tablet Chew 1  tablet (81 mg total) by mouth daily.  . Blood Glucose Monitoring Suppl (ONE TOUCH ULTRA 2) w/Device KIT Use to check blood sugar once a day. Dx. E11.9  . buPROPion (WELLBUTRIN XL) 150 MG 24 hr tablet TAKE 1 TABLET BY MOUTH DAILY  . Calcium Carbonate-Vitamin D (CALCIUM 500/D PO) Take 500 mg by mouth daily.   . calcium-vitamin D (OSCAL 500/200 D-3) 500-200 MG-UNIT tablet Take 1 tablet by mouth 2 (two) times daily.  . citalopram (CELEXA) 40 MG tablet Take 1 tablet (40 mg total) by mouth daily.  . clopidogrel (PLAVIX) 75 MG tablet Take 1 tablet (75 mg total) by mouth daily with breakfast.  . Cyanocobalamin (VITAMIN B 12 PO) Take 1,000 mcg by mouth daily.   Marland Kitchen gabapentin (NEURONTIN) 300 MG capsule TAKE 1 CAPSULE BY MOUTH TWICE A DAY  . hyoscyamine (LEVSIN SL) 0.125 MG SL tablet Place 1 tablet (0.125 mg total) under the tongue every 6 (six) hours as needed. (Patient taking  differently: Place 0.125 mg under the tongue every 6 (six) hours as needed for cramping. )  . isosorbide mononitrate (IMDUR) 120 MG 24 hr tablet Take 1 tablet (120 mg total) by mouth daily.  Marland Kitchen levothyroxine (SYNTHROID, LEVOTHROID) 25 MCG tablet Take 1 tablet (25 mcg total) daily before breakfast by mouth.  . losartan (COZAAR) 25 MG tablet Take 1/2 tablet (12.5 mg) by mouth once daily  . metoprolol succinate (TOPROL-XL) 50 MG 24 hr tablet Take 1 tablet (50 mg total) by mouth daily. Take with or immediately following a meal.  . Multiple Vitamin (MULTIVITAMIN WITH MINERALS) TABS tablet Take 1 tablet by mouth daily.  . nitroGLYCERIN (NITROSTAT) 0.4 MG SL tablet Place 1 tablet (0.4 mg total) under the tongue every 5 (five) minutes as needed for chest pain.  . pantoprazole (PROTONIX) 40 MG tablet Take 1 tablet (40 mg total) by mouth 2 (two) times daily.  . promethazine (PHENERGAN) 25 MG tablet Take 1 tablet (25 mg total) by mouth every 6 (six) hours as needed for nausea or vomiting.  . pyridostigmine (MESTINON) 60 MG tablet Take 60 mg by mouth 3 (three) times daily.   . rosuvastatin (CRESTOR) 10 MG tablet Take 1 tablet (10 mg total) by mouth daily.    Allergies  Allergen Reactions  . Lipitor [Atorvastatin] Other (See Comments)    Leg pains  . Tramadol Other (See Comments)    Mouth feels like it's on fire      Review of Systems negative except from HPI and PMH  Physical Exam BP (!) 132/98 (BP Location: Left Arm, Patient Position: Sitting, Cuff Size: Normal)   Ht '5\' 2"'  (1.575 m)   Wt 177 lb 8 oz (80.5 kg)   BMI 32.47 kg/m  Well developed and well nourished in no acute distress HENT normal E scleral and icterus clear Neck Supple JVP flat; carotids brisk and full Clear to ausculation Regular rate and rhythm, no murmurs gallops or rub Soft with active bowel sounds No clubbing cyanosis  Edema Alert and oriented, grossly normal motor and sensory function Skin Warm and Dry  ECG  demonstrates sinus rhythm at 61 Intervals 14/08/43 Nonspecific ST changes  Assessment and  Plan  Orthostatic hypotension and syncope  CAD with prior PCI  Chest pain  SStrokes " watershed"  Palpitations  Renal insufficiency chronic grade 3-4  Hx Gastric Bypass Surgery  Anomalous Aortic circulation with carotid -L [place]ike artery bypass  Orthostatic hypotension is much improved on the [place].  Blood pressures a little  bit elevated and with her ongoing issues of chest discomfort we will increase her amlodipine from 5--7.5  Chest pain may well be anginal.  She has had recurrent issues with the second diagonal.  There is concern regarding renal function and multiple catheterization; for now we will treated medically but she is advised if she has persistent discomfort to go to the emergency room.    Current medicines are reviewed at length with the patient today .  The patient does not have concerns regarding medicines.

## 2017-11-18 NOTE — Telephone Encounter (Signed)
Patient was seen in clinic today by Dr. Caryl Comes.

## 2017-11-18 NOTE — Patient Instructions (Signed)
Medication Instructions:  - Your physician has recommended you make the following change in your medication:   1) INCREASE norvasc (amlodipine) 5 mg- take 1.5 tablets (7.5 mg) by mouth once daily  If you need a refill on your cardiac medications before your next appointment, please call your pharmacy.   Lab work: - none ordered  If you have labs (blood work) drawn today and your tests are completely normal, you will receive your results only by: Marland Kitchen MyChart Message (if you have MyChart) OR . A paper copy in the mail If you have any lab test that is abnormal or we need to change your treatment, we will call you to review the results.  Testing/Procedures: - none ordered  Follow-Up: At [facility], you and your health needs are our priority.  As part of our continuing mission to provide you with exceptional heart care, we have created designated Provider Care Teams.  These Care Teams include your primary Cardiologist (physician) and Advanced Practice Providers (APPs -  Physician Assistants and Nurse Practitioners) who all work together to provide you with the care you need, when you need it. . You will need a follow up appointment in 1 year with Dr. Caryl Comes.  Please call our office 2 months in advance to schedule this appointment.   Any Other Special Instructions Will Be Listed Below (If Applicable). - N/A

## 2017-11-18 NOTE — Telephone Encounter (Signed)
Called [facility] to inquire abuot pt's endo / colonoscopy. Spoke with Miracle and inquired whether these procedures pt is having done are elective or what. She asked to fax the progress note to me and let me have Vin Bhagat, PA-C review it. Will await that to come thru

## 2017-11-19 DIAGNOSIS — E1143 Type 2 diabetes mellitus with diabetic autonomic (poly)neuropathy: Secondary | ICD-10-CM | POA: Diagnosis not present

## 2017-11-19 DIAGNOSIS — Z8673 Personal history of transient ischemic attack (TIA), and cerebral infarction without residual deficits: Secondary | ICD-10-CM | POA: Diagnosis not present

## 2017-11-19 LAB — CALPROTECTIN, FECAL: Calprotectin, Fecal: 83 ug/g (ref 0–120)

## 2017-11-22 LAB — PANCREATIC ELASTASE, FECAL: Pancreatic Elastase-1, Stool: 224 ug Elast./g (ref >200)

## 2017-11-22 NOTE — Telephone Encounter (Signed)
   Primary Cardiologist: Ida Rogue, MD  Chart reviewed as part of pre-operative protocol coverage.  There appear to be concerns on the part of Dr. Caryl Comes for possible recent anginal symptoms (see recent OV). He deferred to Dr. [name] to review. Per incoming request, colonoscopy/endoscopy is not until 01/24/18. In the meantime this patient has an appointment 12/13/17 at which time clearance can be evaluated. I will route this recommendation to the requesting party via Epic fax function and remove from pre-op pool to make them aware. Final clearance for procedure including holding of blood thinners will be at discretion of Dr. [name]. I will remove this from pre-op APP pool.  I will also forward message onward to Dr. Donivan Scull nurse to see if they can move OV up given patient's recent complaints of chest pain, and to check on her to see how she is doing. Dr. Caryl Comes had advised going to ER if chest pain was no better.   Charlie Pitter, PA-C 11/22/2017, 1:53 PM

## 2017-11-23 NOTE — Telephone Encounter (Signed)
Scheduled her to come in on Monday 11/29/17 to see Dr. [name].

## 2017-11-25 DIAGNOSIS — I214 Non-ST elevation (NSTEMI) myocardial infarction: Secondary | ICD-10-CM | POA: Diagnosis present

## 2017-11-25 IMAGING — MG MM DIGITAL SCREENING BILAT W/ CAD
4 series · 4 of 4 positions shown · non-contrast
Comparison: Previous exam(s).

CLINICAL DATA: Screening.

EXAM:
DIGITAL SCREENING BILATERAL MAMMOGRAM WITH CAD

[L MLO]
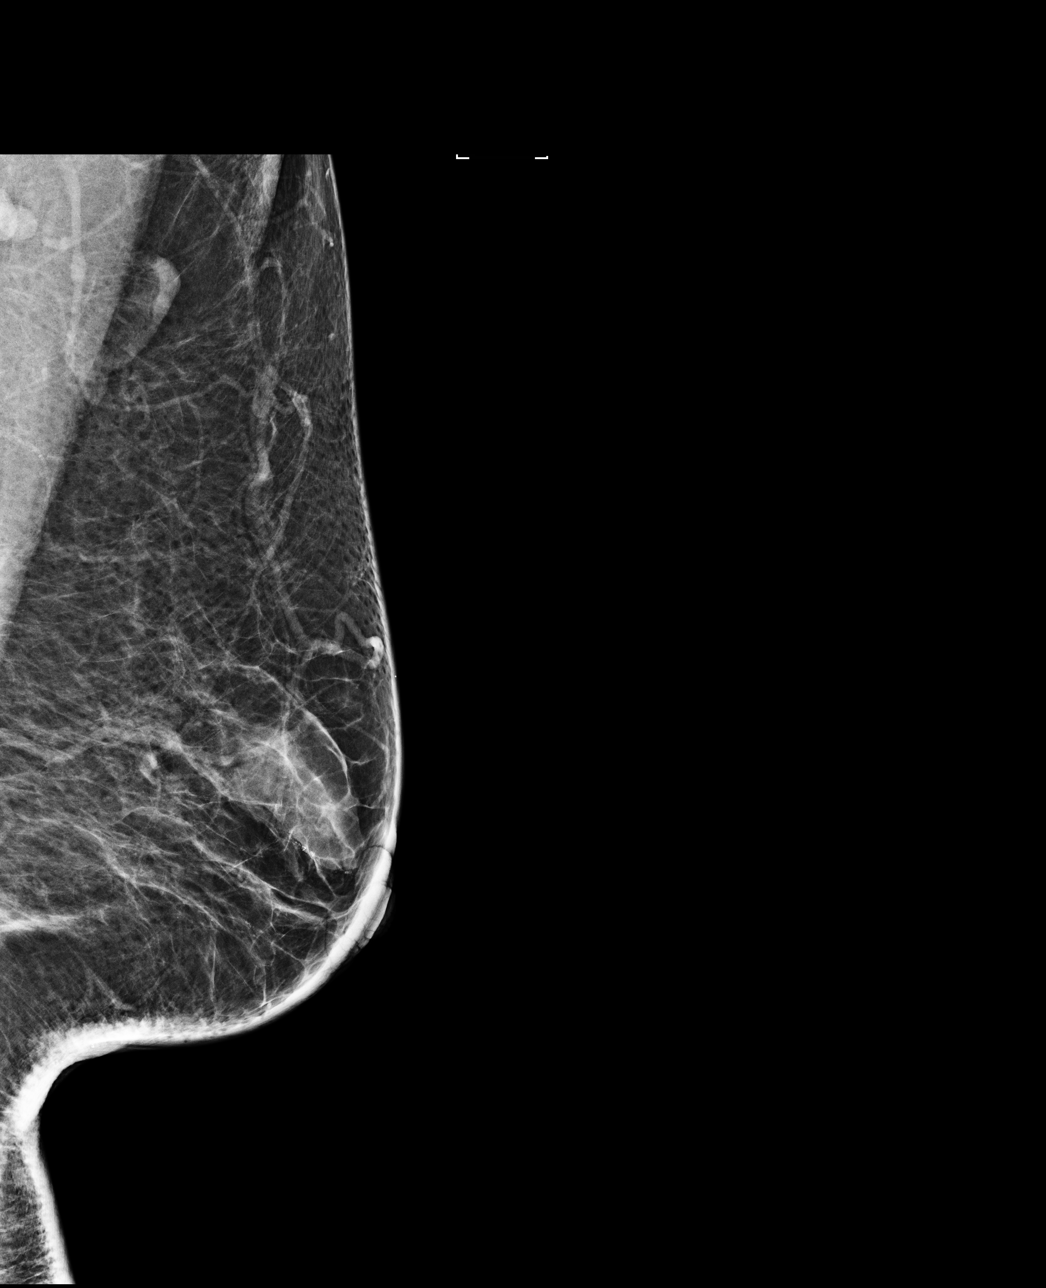

[R CC]
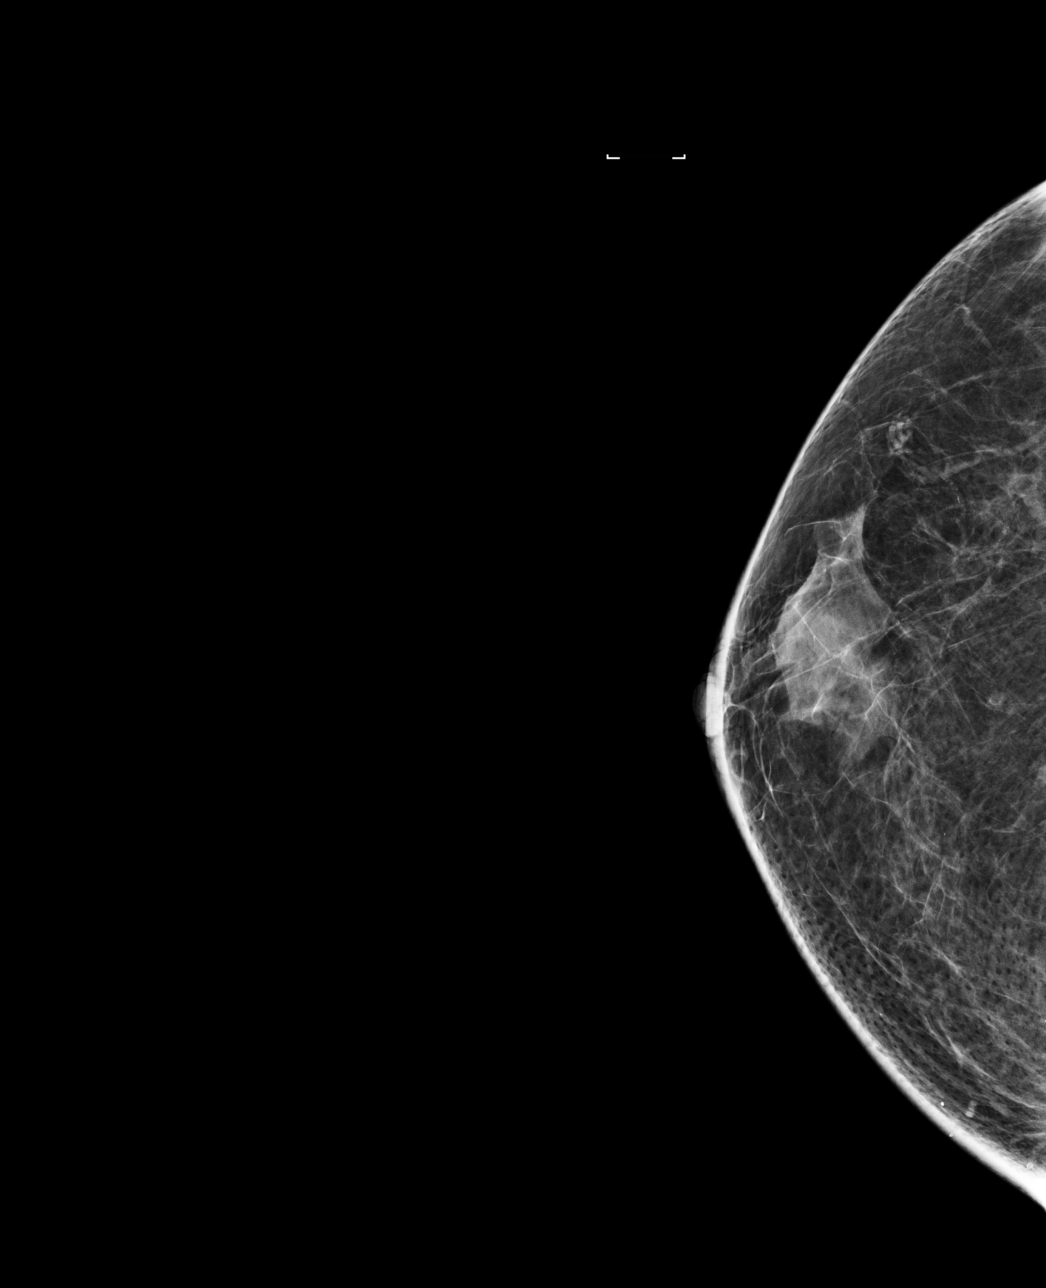

[R MLO]
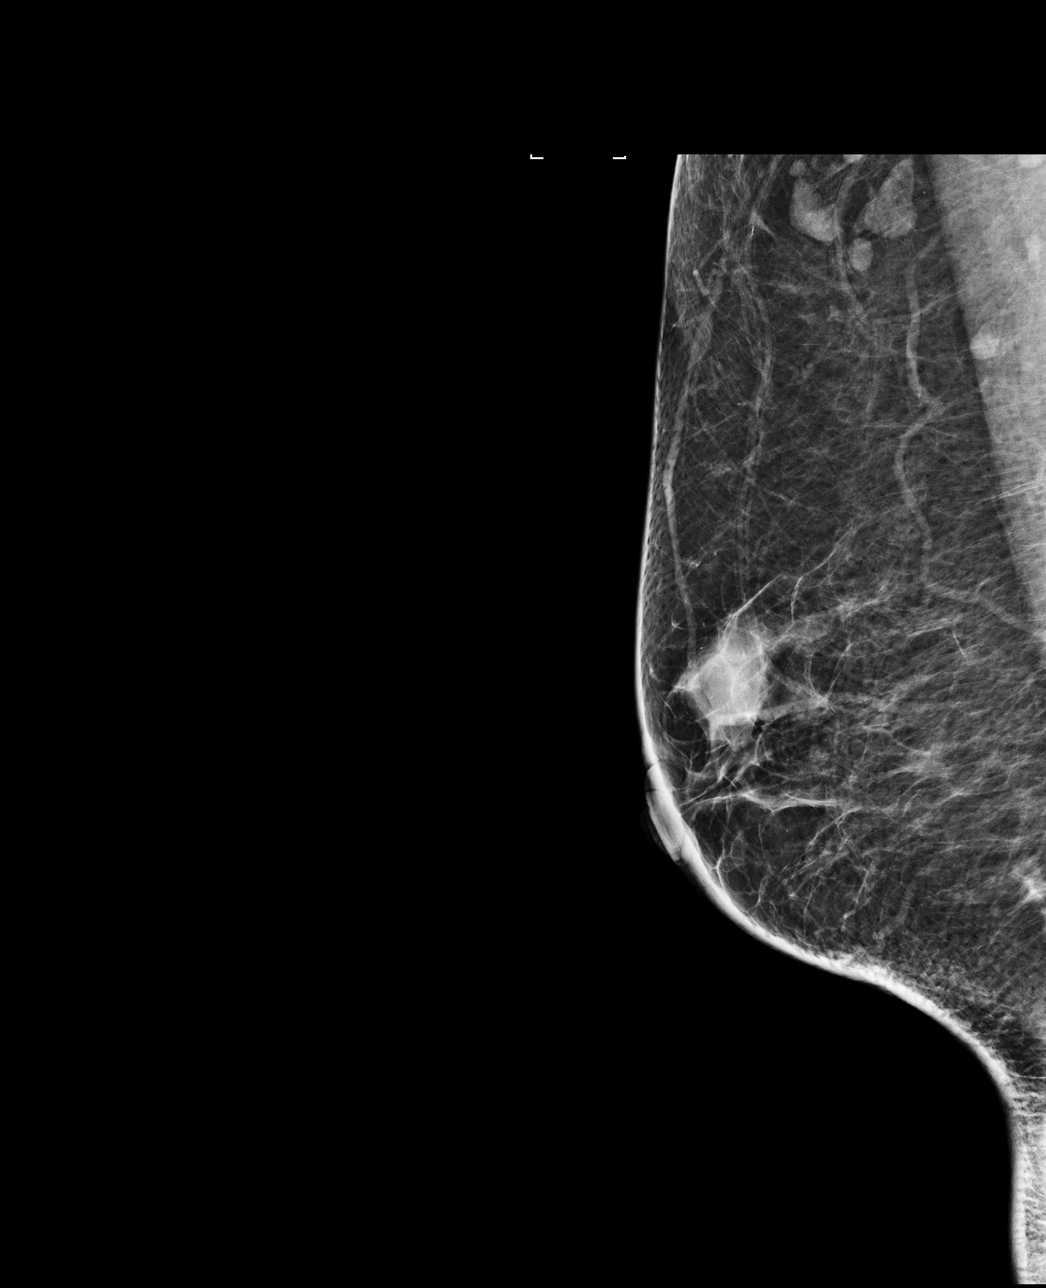

[L CC]
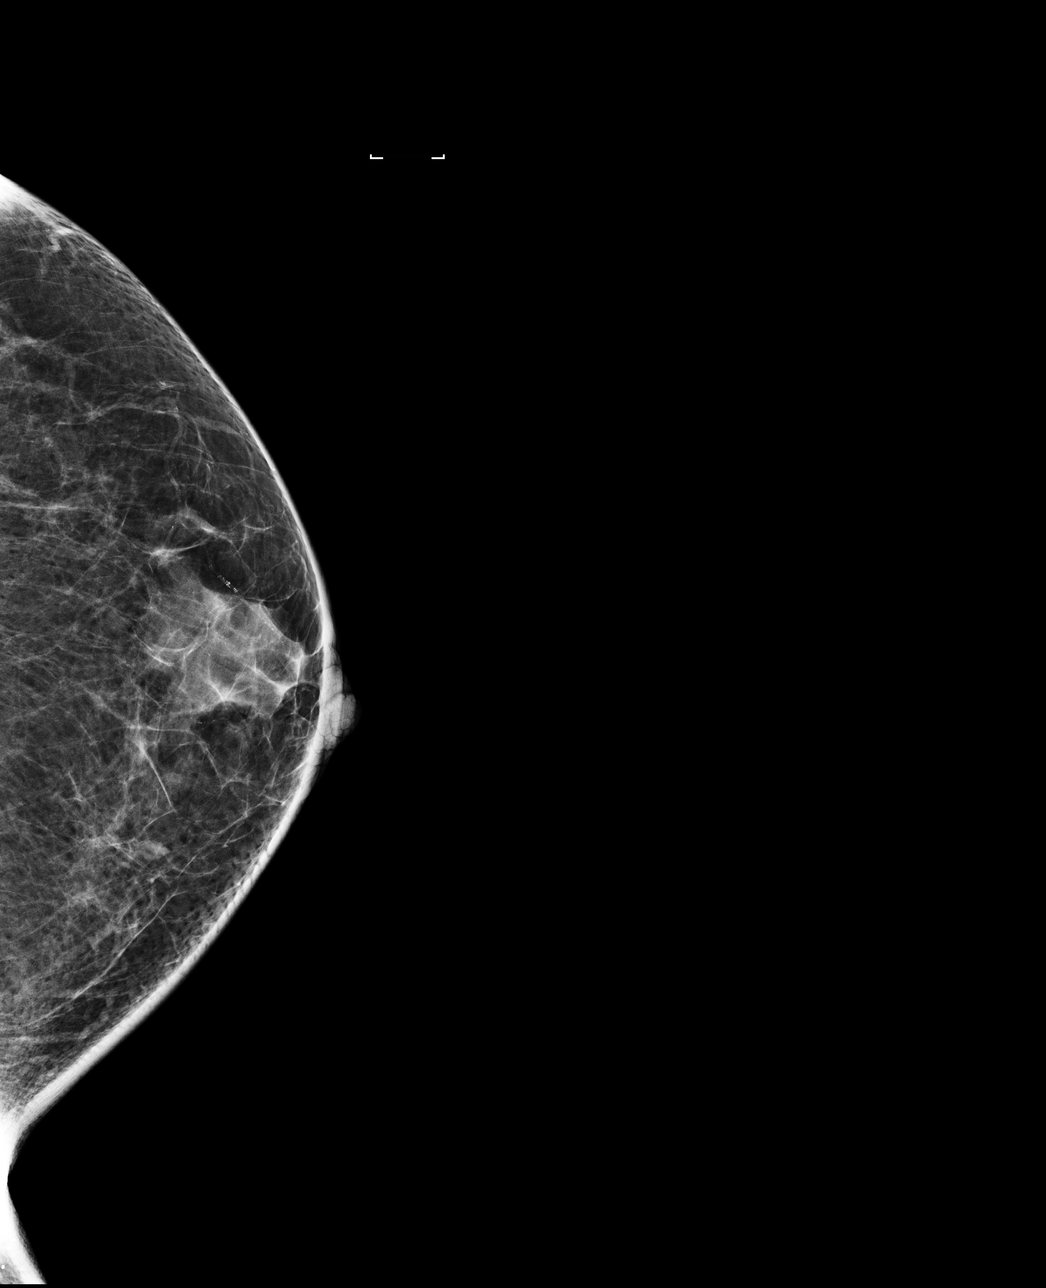

[4 of 4 positions shown; findings below may reference images not displayed]

ACR Breast Density Category c: The breast tissue is heterogeneously
dense, which may obscure small masses.
FINDINGS: There are no findings suspicious for malignancy. Images were
processed with CAD.
IMPRESSION: No mammographic evidence of malignancy. A result letter of this
screening mammogram will be mailed directly to the patient.

RECOMMENDATION:
Screening mammogram in one year. (Code:YJ-[id number])

BI-RADS CATEGORY  1: Negative.

## 2017-11-25 IMAGING — CR DG CHEST 2V
2 series · 2 of 2 positions shown · non-contrast
Comparison: 10/28/2015

CLINICAL DATA: Midsternal chest pain tonight radiating to the left
arm. Nausea.

EXAM:
CHEST  2 VIEW

[chest pa]
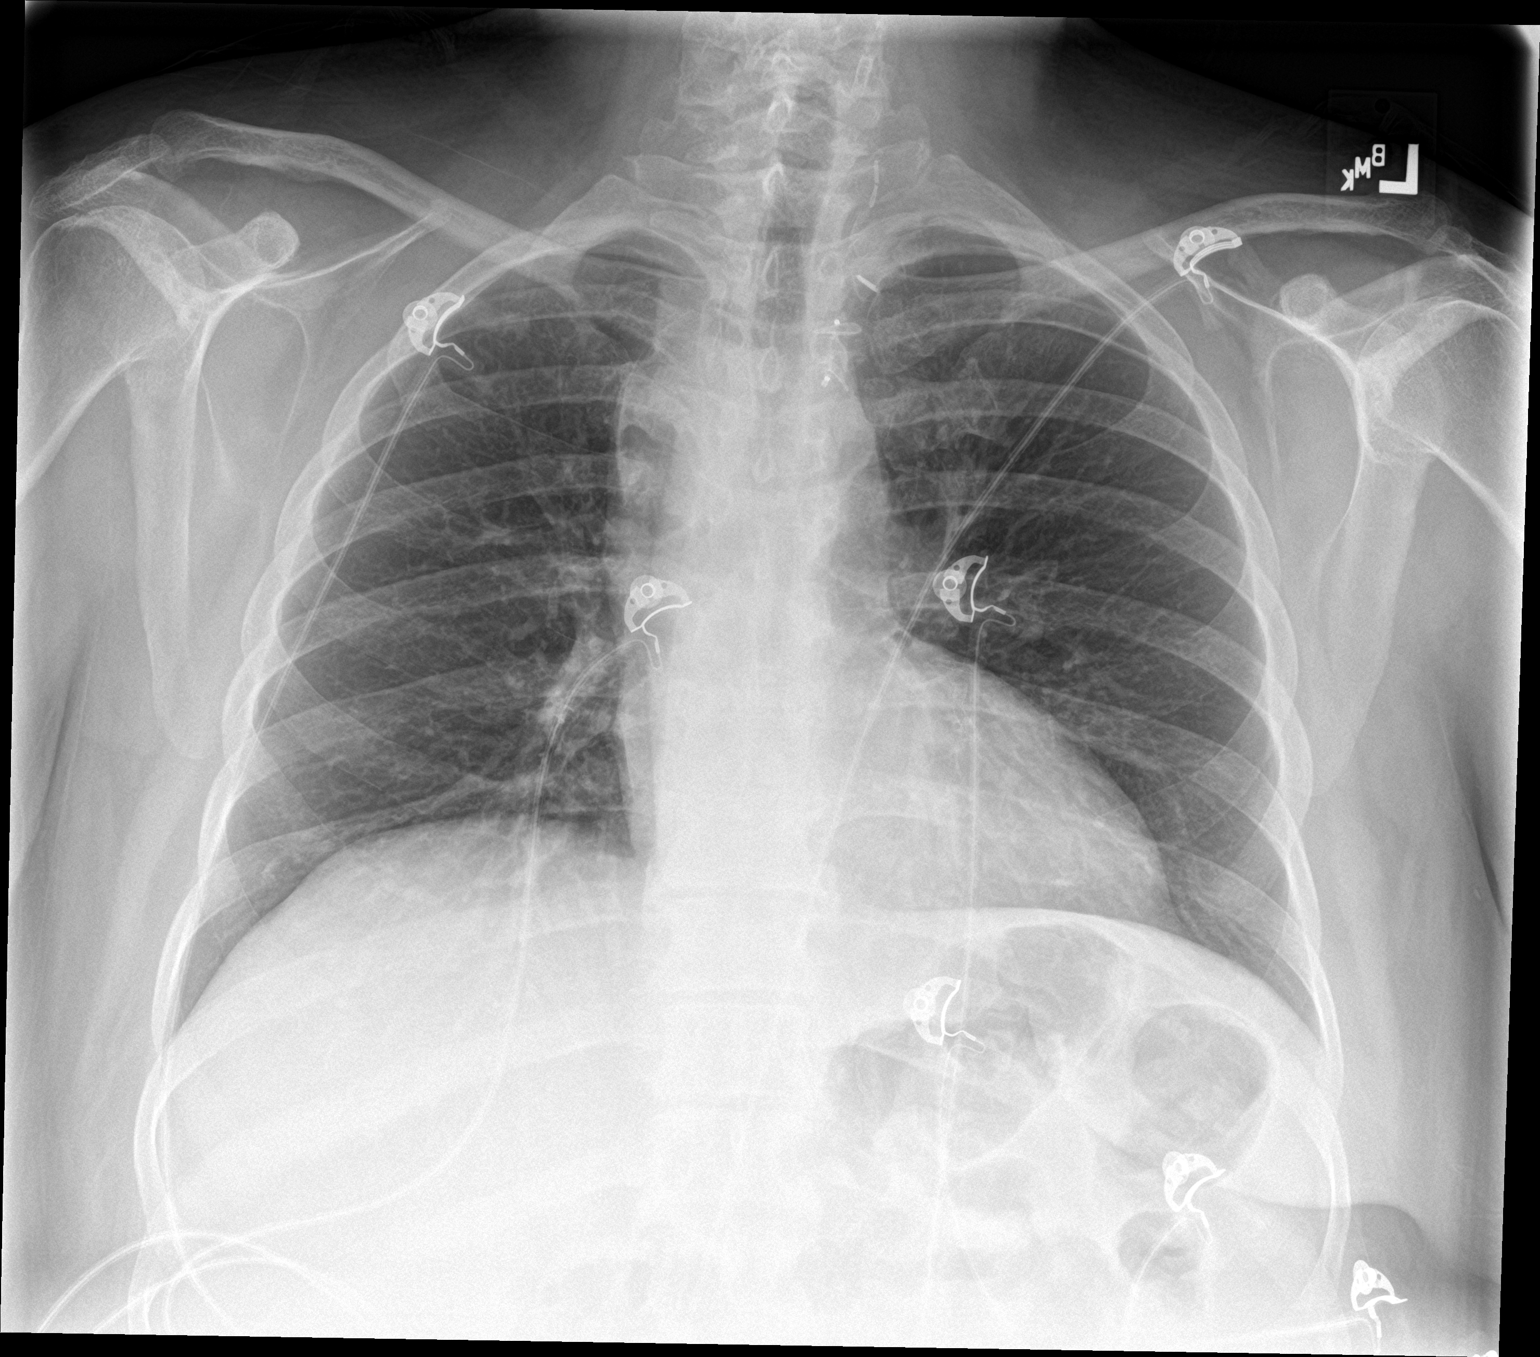

[chest lat]
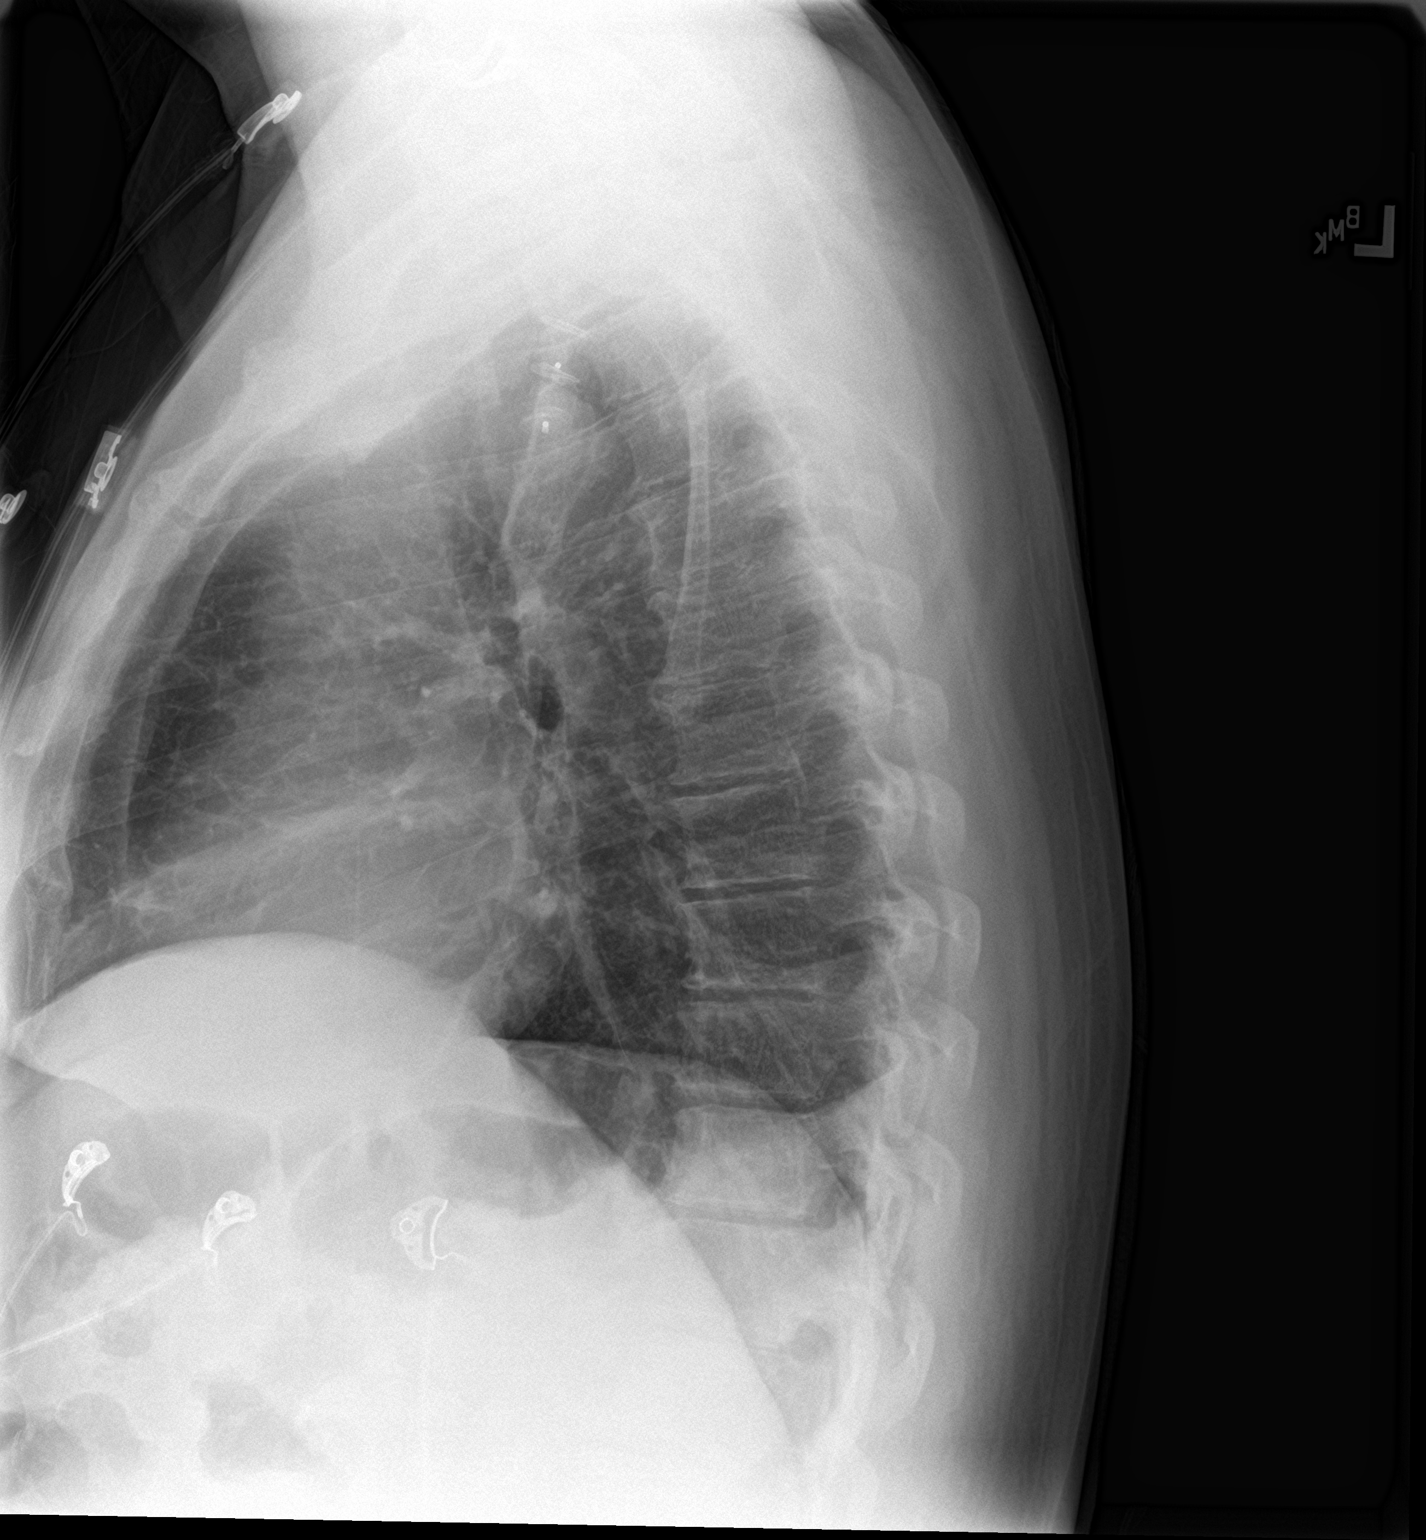

[2 of 2 positions shown; findings below may reference images not displayed]

FINDINGS: Normal heart size and pulmonary vascularity. No focal airspace
disease or consolidation in the lungs. No blunting of costophrenic
angles. No pneumothorax. Mediastinal contours appear intact.
Surgical clips in the base of the neck.
IMPRESSION: No active cardiopulmonary disease.

## 2017-11-26 DIAGNOSIS — E1342 Other specified diabetes mellitus with diabetic polyneuropathy: Secondary | ICD-10-CM | POA: Diagnosis not present

## 2017-11-26 DIAGNOSIS — I639 Cerebral infarction, unspecified: Secondary | ICD-10-CM | POA: Diagnosis not present

## 2017-11-26 DIAGNOSIS — Z9884 Bariatric surgery status: Secondary | ICD-10-CM | POA: Diagnosis not present

## 2017-11-26 DIAGNOSIS — E669 Obesity, unspecified: Secondary | ICD-10-CM | POA: Diagnosis not present

## 2017-11-26 DIAGNOSIS — G4700 Insomnia, unspecified: Secondary | ICD-10-CM | POA: Diagnosis not present

## 2017-11-26 DIAGNOSIS — R2681 Unsteadiness on feet: Secondary | ICD-10-CM | POA: Diagnosis not present

## 2017-11-28 NOTE — Progress Notes (Signed)
Cardiology Office Note  Date:  11/29/2017   ID:  [address], [place] 1970/04/22, MRN [id number]  PCP:  [name], MD   Chief Complaint  Patient presents with  . Other    2 month f/u c/o chest pain and sob. Meds reviewed verbally with pt.    HPI:  47 year old woman with a long hx of  smoking,  strokes,  2010 chest pain dating back to 2005  CAD, Previous LAD stent  normal LV function,   s/p gastric bypass surgery,  Baseline creatinine 1.5, previously taken off her diuretic Catheterization May 2017 with stent placed to her first diagonal, there was residual 50% distal LAD disease, 30% proximal, 40% proximal circumflex Who presents for routine followup of her coronary artery disease  Previous cardiac catheterization results discussed with her in detail Cardiac catheterization September 20, 2017 Significant one-vessel coronary artery disease with patent stents in the mid LAD and first diagonal.   significant chronic disease in the distal LAD close to the apex.  Severe restenosis  ostial second diagonal at the site of previous angioplasty in May. ----Successful cutting balloon angioplasty of the ostial second diagonal without stent placement.   [place] scheduled for colonoscopy and EGD, for diarrhea. Scheduled in December 2019 Previous Gi issues, trouble swallowing Will eat only once a day Hx of gastric bypass  everyonce in a while, with angina No NTG in 2 weeks Some episodes does not take NTG No improvement after PTCA in aug 2019  Seeing endocrine for diabetes HBA1C 6.0  No difference in chest pain on the ranexa  EKG personally reviewed by myself on todays visit Shows normal sinus rhythm rate 75 bpm nonspecific ST-T wave abnormality   Other past medical history reviewed cardiac catheterization 06/29/2017 70% mid LAD disease and first diagonal, as well as significant distal LAD disease close to apex Severe ostial stenosis second diagonal which was new  Cardiac  catheterization   results reviewed with her from  March 2017showing moderate LAD disease, 70% diagonal disease small vessel, near the ostium,  Decision was made to treat this medically  Recent carotid ultrasound reviewed with her showing minimal bilateral carotid disease Recent echocardiogram reviewed with her showing essentially normal ejection fraction  She is status post left carotid to subclavian artery bypass with subclavian artery ligation, done [facility] by vascular surgery admission to [facility] from 10/5 to 11/13/14 with unstable angina and was found to have severe mid LAD lesion at 85-90% stenosis on diagnositc cardiac cath s/p PCI/DES on staged procedure.   CT in October 2015 showing Right aortic arch with a large retro- esophageal vascular segment, persistent dorsal aortic segment) giving off aberrant left subclavian artery. This large caliber retroaortic segment measuring 18 mm compared to the ascending aorta which measures 22 mm. This large retroesophageal aortic segment along with the partially calcified atretic ductus ligament between the left aberrant subclavian artery and the left pulmonary artery creates an ALMOSTcomplete vascular ring. This is likelythe cause ofpatient's dysphagia [place].  Previously was taking midodrine or Florinef for hypotension following her stroke.  She previously presented to the hospital December 18, 2010 with chest pain.   stress test in the hospital showed no ischemia. Cardiac enzymes were negative. No EKG changes noted.   admission to the hospital 11/01/2012 with discharge 11/02/2012 with chest pain. She ruled out with negative cardiac enzymes. Blood pressures were in the 924-268 systolic range. Discharge blood pressure medications included amlodipine 10 mg daily, benazepril 40 mg daily,  clonidine 0.2 mg., Coreg 3.125 mg twice a day  stroke and admission to the hospital at the end of December 2014. She had acute onset left sided weakness  including arm and leg. She reports that review of MRI showed acute stroke, as well as previous strokes. She was seen by neurology and aspirin changed to Plavix 75 mg daily. Hemoglobin A1c 8.5. Notes indicate short run of SVT while she was in the hospital and on the monitor.  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[place]), Bipolar disorder ([place]), BRCA negative (2014), CAD (coronary artery disease), CKD (chronic kidney disease), stage III ([place]), Colon polyp, CVA (cerebral vascular accident) ([place]), Diabetes ([place]), Family history of breast cancer, Gastric ulcer (04/27/2011), History of echocardiogram, HTN (hypertension), Hyperlipemia, Hypothyroid, Malignant melanoma of skin of scalp ([place]), MI, acute, non ST segment elevation ([place]), Neuromuscular disorder ([place]), S/P drug eluting coronary stent placement (06/04/2015), and Sepsis ([place]) (03/18/2017).  PSH:    Past Surgical History:  Procedure Laterality Date  . APPENDECTOMY    . CARDIAC CATHETERIZATION N/A 11/09/2014   Procedure: Coronary Angiography;  Surgeon: Minna Merritts, MD;  Location: [place] [facility];  Service: Cardiovascular;  Laterality: N/A;  . CARDIAC CATHETERIZATION N/A 11/12/2014   Procedure: Coronary Stent Intervention;  Surgeon: Isaias Cowman, MD;  Location: [place] [facility];  Service: Cardiovascular;  Laterality: N/A;  . CARDIAC CATHETERIZATION N/A 04/18/2015   Procedure: Left Heart Cath and Coronary Angiography;  Surgeon: Minna Merritts, MD;  Location: [place] [facility];  Service: Cardiovascular;  Laterality: N/A;  . CARDIAC CATHETERIZATION Left 06/04/2015   Procedure: Left Heart Cath and Coronary Angiography;  Surgeon: Wellington Hampshire, MD;  Location: [place] [facility];  Service: Cardiovascular;  Laterality: Left;  . CARDIAC CATHETERIZATION N/A 06/04/2015   Procedure: Coronary Stent Intervention;  Surgeon: Wellington Hampshire, MD;  Location: [place] [facility];  Service: Cardiovascular;  Laterality: N/A;  . CESAREAN SECTION  2001  . CHOLECYSTECTOMY N/A 11/18/2016   Procedure: LAPAROSCOPIC CHOLECYSTECTOMY WITH INTRAOPERATIVE CHOLANGIOGRAM;  Surgeon: Christene Lye, MD;  Location: [facility];  Service: General;  Laterality: N/A;  . COLONOSCOPY WITH PROPOFOL N/A 04/27/2016   Procedure: COLONOSCOPY WITH PROPOFOL;  Surgeon: [name], MD;  Location: [place];  Service: Endoscopy;  Laterality: N/A;  . CORONARY ANGIOPLASTY    . CORONARY BALLOON ANGIOPLASTY N/A 06/29/2017   Procedure: CORONARY BALLOON ANGIOPLASTY;  Surgeon: Wellington Hampshire, MD;  Location: [place] [facility];  Service: Cardiovascular;  Laterality: N/A;  . CORONARY BALLOON ANGIOPLASTY N/A 09/20/2017   Procedure: CORONARY BALLOON ANGIOPLASTY;  Surgeon: Wellington Hampshire, MD;  Location: [place] [facility];  Service: Cardiovascular;  Laterality: N/A;  . DILATION AND CURETTAGE OF UTERUS    . ESOPHAGOGASTRODUODENOSCOPY (EGD) WITH PROPOFOL N/A 09/14/2014   Procedure: ESOPHAGOGASTRODUODENOSCOPY (EGD) WITH PROPOFOL;  Surgeon: Josefine Class, MD;  Location[facility];  Service: Endoscopy;  Laterality: N/A;  . ESOPHAGOGASTRODUODENOSCOPY (EGD) WITH PROPOFOL N/A 04/27/2016   Procedure: ESOPHAGOGASTRODUODENOSCOPY (EGD) WITH PROPOFOL;  Surgeon: [name], MD;  Location: [place];  Service: Endoscopy;  Laterality: N/A;  Diabetic - oral meds  . GASTRIC BYPASS  09/2009   [facility]   . Left Carotid to sublcavian  artery bypass w/ subclavian artery ligation     a. Performed @ [place].  . LEFT HEART CATH AND CORONARY ANGIOGRAPHY Left 06/29/2017   Procedure: LEFT HEART CATH AND CORONARY ANGIOGRAPHY;  Surgeon: Wellington Hampshire, MD;  Location: [place] [facility];  Service: Cardiovascular;  Laterality: Left;  . LEFT HEART CATH AND CORONARY ANGIOGRAPHY N/A 09/20/2017   Procedure: LEFT HEART CATH AND CORONARY ANGIOGRAPHY;  Surgeon: Wellington Hampshire, MD;  Location: [place] [facility];  Service: Cardiovascular;  Laterality: N/A;  . MELANOMA EXCISION  2016   Dr. Evorn Gong  . [place]  2002  . RIGHT OOPHORECTOMY    . SHOULDER ARTHROSCOPY WITH OPEN ROTATOR CUFF REPAIR Right 01/07/2016   Procedure: SHOULDER ARTHROSCOPY WITH DEBRIDMENT, SUBACHROMIAL DECOMPRESSION;  Surgeon: Corky Mull, MD;  Location: [facility];  Service: Orthopedics;  Laterality: Right;  . SHOULDER ARTHROSCOPY WITH OPEN ROTATOR CUFF REPAIR Right 03/16/2017   Procedure: SHOULDER ARTHROSCOPY WITH OPEN ROTATOR CUFF REPAIR POSSIBLE BICEPS TENODESIS;  Surgeon: Corky Mull, MD;  Location: [facility];  Service: Orthopedics;  Laterality: Right;  . TRIGGER FINGER RELEASE Right     [place]    Current Outpatient Medications  Medication Sig Dispense Refill  . ALPRAZolam (XANAX) 1 MG tablet Take 1 tablet (1 mg total) by mouth daily as needed for anxiety. 30 tablet 3  . amLODipine (NORVASC) 5 MG tablet Take 1.5 tablets (7.5 mg total) by mouth daily. 45 tablet 6  . aspirin 81 MG chewable tablet Chew 1 tablet (81 mg total) by mouth daily. 30 tablet 11  . Blood Glucose Monitoring Suppl (ONE TOUCH ULTRA 2) w/Device KIT Use to check blood sugar once a day. Dx. E11.9 1 each 0  . buPROPion (WELLBUTRIN XL) 150 MG 24 hr tablet TAKE 1 TABLET BY MOUTH DAILY 30 tablet 11  . Calcium Carbonate-Vitamin D (CALCIUM 500/D PO) Take 500 mg by mouth daily.     . calcium-vitamin D (OSCAL 500/200 D-3) 500-200 MG-UNIT tablet Take 1 tablet by mouth 2 (two) times daily.  60 tablet 0  . citalopram (CELEXA) 40 MG tablet Take 1 tablet (40 mg total) by mouth daily. 30 tablet 5  . clopidogrel (PLAVIX) 75 MG tablet Take 1 tablet (75 mg total) by mouth daily with breakfast. 30 tablet 2  . Cyanocobalamin (VITAMIN B 12 PO) Take 1,000 mcg by mouth daily.     Marland Kitchen gabapentin (NEURONTIN) 300 MG capsule TAKE 1 CAPSULE BY MOUTH TWICE A DAY 60 capsule 5  . hyoscyamine (LEVSIN SL) 0.125 MG SL tablet Place 1 tablet (0.125 mg total) under the tongue every 6 (six) hours as needed. (Patient taking differently: Place 0.125 mg under the tongue every 6 (six) hours as needed for cramping. ) 60 tablet 0  . isosorbide mononitrate (IMDUR) 120 MG 24 hr tablet Take 1 tablet (120 mg total) by mouth daily. 90 tablet 1  . levothyroxine (SYNTHROID, LEVOTHROID) 25 MCG tablet Take 1 tablet (25 mcg total) daily before breakfast by mouth. 30 tablet 12  . lipase/protease/amylase (CREON) 36000 UNITS CPEP capsule Take 2 tablets with first bite of each meal, and 1 tablet with first bite of a snack.    Marland Kitchen losartan (COZAAR) 25 MG tablet Take 1/2 tablet (12.5 mg) by mouth once daily 45 tablet 1  . metoprolol succinate (TOPROL-XL) 50  MG 24 hr tablet Take 1 tablet (50 mg total) by mouth daily. Take with or immediately following a meal. 30 tablet 3  . Multiple Vitamin (MULTIVITAMIN WITH MINERALS) TABS tablet Take 1 tablet by mouth daily.    . nitroGLYCERIN (NITROSTAT) 0.4 MG SL tablet Place 1 tablet (0.4 mg total) under the tongue every 5 (five) minutes as needed for chest pain. 25 tablet 2  . pantoprazole (PROTONIX) 40 MG tablet Take 1 tablet (40 mg total) by mouth 2 (two) times daily. 60 tablet 5  . promethazine (PHENERGAN) 25 MG tablet Take 1 tablet (25 mg total) by mouth every 6 (six) hours as needed for nausea or vomiting. 30 tablet 3  . pyridostigmine (MESTINON) 60 MG tablet Take 60 mg by mouth 3 (three) times daily.     . rosuvastatin (CRESTOR) 10 MG tablet Take 1 tablet (10 mg total) by mouth daily. 30 tablet  11   No current facility-administered medications for this visit.      Allergies:   Lipitor [atorvastatin] and Tramadol   Social History:  The patient  reports that she quit smoking about 23 years ago. Her smoking use included cigarettes. She has never used smokeless tobacco. She reports that she does not drink alcohol or use drugs.   Family History:   family history includes Anxiety disorder in her father and mother; Bipolar disorder in her mother; Breast cancer in her other; Breast cancer (age of onset: 40) in her maternal aunt and maternal aunt; Colon cancer in her cousin and father; Depression in her father, mother, and sister; Diabetes in her father and sister; Heart disease in her mother; Heart disease (age of onset: 74) in her father; Hyperlipidemia in her mother and sister; Hypertension in her father, mother, sister, and sister; Kidney disease in her father and sister; Ovarian cancer in her cousin; Skin cancer in her father; Stroke in her father; Thyroid nodules in her sister.    Review of Systems: Review of Systems  Constitutional: Negative.   Respiratory: Negative.   Cardiovascular: Positive for chest pain.  Gastrointestinal: Negative.   Musculoskeletal: Negative.   Neurological: Negative.   Psychiatric/Behavioral: Negative.   All other systems reviewed and are negative.   PHYSICAL EXAM: VS:  BP 124/84 (BP Location: Left Arm, Patient Position: Sitting, Cuff Size: Normal)   Pulse 75   Ht '5\' 2"'  (1.575 m)   Wt 171 lb 12 oz (77.9 kg)   BMI 31.41 kg/m  , BMI Body mass index is 31.41 kg/m.  No significant change in exam Constitutional:  oriented to person, place, and time. No distress.  HENT:  Head: Normocephalic and atraumatic.  Eyes:  no discharge. No scleral icterus.  Neck: Normal range of motion. Neck supple. No JVD present.  Cardiovascular: Normal rate, regular rhythm, normal heart sounds and intact distal pulses. Exam reveals no gallop and no friction rub. No  edema No murmur heard. Pulmonary/Chest: Effort normal and breath sounds normal. No stridor. No respiratory distress.  no wheezes.  no rales.  no tenderness.  Abdominal: Soft.  no distension.  no tenderness.  Musculoskeletal: Normal range of motion.  no  tenderness or deformity.  Neurological: Grossly nonfocal Skin: Skin is warm and dry. No rash noted. not diaphoretic.  Psychiatric:  normal mood and affect. behavior is normal. Thought content normal.   Recent Labs: 03/19/2017: TSH 1.013 06/18/2017: BNP 317.4 07/09/2017: Magnesium 1.6 11/04/2017: ALT 12; BUN 27; Creatinine, Ser 2.67; Hemoglobin 11.0; Platelets 154; Potassium 3.9; Sodium 136  Lipid Panel Lab Results  Component Value Date   CHOL 73 09/20/2017   HDL 29 (L) 09/20/2017   LDLCALC 35 09/20/2017   TRIG 45 09/20/2017      Wt Readings from Last 3 Encounters:  11/29/17 171 lb 12 oz (77.9 kg)  11/18/17 177 lb 8 oz (80.5 kg)  11/04/17 170 lb (77.1 kg)       ASSESSMENT AND PLAN:   Coronary artery disease involving native coronary artery of native heart with unstable angina pectoris ([place]) - Plan: EKG 12-Lead Angioplasty 3 months ago did not seem to help her anginal symptoms Discussed details with her, possibly secondary to distal LAD disease and not her diagonal disease Has not taken nitroglycerin in 2 weeks Symptoms are in fact improving on Imdur Case previously discussed with Dr. Fletcher Anon.  No further work-up at this time Acceptable risk for her colonoscopy in December with a EGD  Pure hypercholesterolemia stay on her Crestor 40 mg daily and Zetia Numbers at goal  Uncontrolled type 2 diabetes mellitus with other circulatory complication, without long-term current use of insulin (HCC) Hemoglobin A1c 6.0 Working with endocrinology Stressed importance of aggressive diabetes control  Essential hypertension Blood pressure is well controlled on today's visit. No changes made to the medications. Stable  Cerebrovascular  accident (CVA), unspecified mechanism ([place]) Previously seen by neurology at [facility] They felt her stroke was secondary to hypotension per the patient Recommend she cut back on her amlodipine to 5 mg daily  Tremor She is requesting to go up on her metoprolol from 25 up to 50 mg daily Recommend she try the 50 mg daily but monitor blood pressure closely to avoid hypotension  Chronic kidney disease (CKD), stage III (moderate) Followed by nephrology, creatinine down to 1.5  avoid Lasix and NSAIDs  Disposition:   F/U  12 months   Total encounter time more than 25 minutes  Greater than 50% was spent in counseling and coordination of care with the patient    No orders of the defined types were placed in this encounter.    Signed, Esmond Plants, M.D., Ph.D. 11/29/2017  [place], [place]

## 2017-11-29 ENCOUNTER — Ambulatory Visit (INDEPENDENT_AMBULATORY_CARE_PROVIDER_SITE_OTHER): Payer: Medicare Other | Admitting: Cardiovascular Disease

## 2017-11-29 ENCOUNTER — Ambulatory Visit
Admission: RE | Admit: 2017-11-29 | Discharge: 2017-11-29 | Disposition: A | Discharge status: Home / Self Care | Payer: Medicare Other | Source: Ambulatory Visit | Attending: Student | Admitting: Student

## 2017-11-29 ENCOUNTER — Encounter: Payer: Self-pay | Admitting: Cardiovascular Disease

## 2017-11-29 ENCOUNTER — Other Ambulatory Visit: Payer: Self-pay | Admitting: Student

## 2017-11-29 VITALS — BP 124/84 | HR 75 | Ht 62.0 in | Wt 171.8 lb

## 2017-11-29 DIAGNOSIS — E1122 Type 2 diabetes mellitus with diabetic chronic kidney disease: Secondary | ICD-10-CM | POA: Diagnosis not present

## 2017-11-29 DIAGNOSIS — E7800 Pure hypercholesterolemia, unspecified: Secondary | ICD-10-CM

## 2017-11-29 DIAGNOSIS — I25118 Atherosclerotic heart disease of native coronary artery with other forms of angina pectoris: Secondary | ICD-10-CM

## 2017-11-29 DIAGNOSIS — I200 Unstable angina: Secondary | ICD-10-CM | POA: Diagnosis not present

## 2017-11-29 DIAGNOSIS — N183 Chronic kidney disease, stage 3 unspecified: Secondary | ICD-10-CM

## 2017-11-29 DIAGNOSIS — R1310 Dysphagia, unspecified: Secondary | ICD-10-CM | POA: Diagnosis not present

## 2017-11-29 DIAGNOSIS — I6329 Cerebral infarction due to unspecified occlusion or stenosis of other precerebral arteries: Secondary | ICD-10-CM | POA: Diagnosis not present

## 2017-11-29 DIAGNOSIS — I10 Essential (primary) hypertension: Secondary | ICD-10-CM

## 2017-11-29 DIAGNOSIS — I208 Other forms of angina pectoris: Secondary | ICD-10-CM | POA: Diagnosis not present

## 2017-11-29 DIAGNOSIS — R1312 Dysphagia, oropharyngeal phase: Secondary | ICD-10-CM

## 2017-11-29 DIAGNOSIS — R05 Cough: Secondary | ICD-10-CM | POA: Diagnosis not present

## 2017-11-29 MED ORDER — AMLODIPINE BESYLATE 5 MG PO TABS: 5.0000 mg | ORAL_TABLET | Freq: Every day | ORAL | 6 refills | Status: DC

## 2017-11-29 NOTE — Patient Instructions (Addendum)
   Medication Instructions:  No changes  If you need a refill on your cardiac medications before your next appointment, please call your pharmacy.    Lab work: No new labs needed   If you have labs (blood work) drawn today and your tests are completely normal, you will receive your results only by: . MyChart Message (if you have MyChart) OR . A paper copy in the mail If you have any lab test that is abnormal or we need to change your treatment, we will call you to review the results.   Testing/Procedures: No new testing needed   Follow-Up: At [facility], you and your health needs are our priority.  As part of our continuing mission to provide you with exceptional heart care, we have created designated Provider Care Teams.  These Care Teams include your primary Cardiologist (physician) and Advanced Practice Providers (APPs -  Physician Assistants and Nurse Practitioners) who all work together to provide you with the care you need, when you need it.  . You will need a follow up appointment in 6 months .   Please call our office 2 months in advance to schedule this appointment.    . Providers on your designated Care Team:   . Christopher Berge, NP . Ryan Dunn, PA-C . Jacquelyn Visser, PA-C  Any Other Special Instructions Will Be Listed Below (If Applicable).  For educational health videos Log in to : [email or web address] Or : [email or web address], password : [email or web address]   

## 2017-11-29 NOTE — Therapy (Signed)
[place], [place], [address] Phone: [phone]   Fax:     Modified Barium Swallow  Patient Details  Name: [name] MRN: [id number] Date of Birth: 1970/08/30 No data recorded  Encounter Date: 11/29/2017    Past Medical History:  Diagnosis Date  . Anemia    iron deficiency anemia  . Aortic arch aneurysm ([place])   . Bipolar disorder ([place])   . BRCA negative 2014  . CAD (coronary artery disease)    a. 08/2003 Cath: LAD 30-40-med Rx; b. 11/2014 PCI: LAD 80m(3.25x23 Xience Alpine DES); c. 06/2015 PCI: D1 (2.25x12 Resolute Integrity DES); d. 06/2017 PCI: Patent mLAD stent, D2 95 (PTCA); e. 09/2017 Cath/PCI: LM nl, LAD 319m70d, D1 40, D2 99ost (CBA), LCX 40p, RCA 40ost/p.  . Marland KitchenKD (chronic kidney disease), stage III (H[place]  . Colon polyp   . CVA (cerebral vascular accident) ([place]   Left side weakness.   . Diabetes ([facility]CC[place]  . Family history of breast cancer    BRCA neg 2014  . Gastric ulcer 04/27/2011  . History of echocardiogram    a. 03/2017 Echo: EF 60-65%, no rwma.  . Marland KitchenTN (hypertension)   . Hyperlipemia   . Hypothyroid   . Malignant melanoma of skin of scalp ([facility]  . MI, acute, non ST segment elevation ([facility]  . Neuromuscular disorder ([facility]CF[place]  . S/P drug eluting coronary stent placement 06/04/2015  . Sepsis (HCHendricks2/14/2019    Past Surgical History:  Procedure Laterality Date  . APPENDECTOMY    . CARDIAC CATHETERIZATION N/A 11/09/2014   Procedure: Coronary Angiography;  Surgeon: TiMinna MerrittsMD;  [facility];  Service: Cardiovascular;  Laterality: N/A;  . CARDIAC CATHETERIZATION N/A 11/12/2014   Procedure: Coronary Stent Intervention;  Surgeon: AlIsaias CowmanMD;  [place];  Service: Cardiovascular;  Laterality: N/A;  . CARDIAC CATHETERIZATION N/A 04/18/2015   Procedure: Left Heart Cath and Coronary Angiography;  Surgeon: TiMinna MerrittsMD;  [facility];  Service: Cardiovascular;  Laterality: N/A;  . CARDIAC CATHETERIZATION Left 06/04/2015   Procedure: Left Heart Cath and Coronary Angiography;  Surgeon: MuWellington HampshireMD;  [place];  Service: Cardiovascular;  Laterality: Left;  . CARDIAC CATHETERIZATION N/A 06/04/2015   Procedure: Coronary Stent Intervention;  Surgeon: MuWellington HampshireMD;  [place];  Service: Cardiovascular;  Laterality: N/A;  . CESAREAN SECTION  2001  . CHOLECYSTECTOMY N/A 11/18/2016   Procedure: LAPAROSCOPIC CHOLECYSTECTOMY WITH INTRAOPERATIVE CHOLANGIOGRAM;  Surgeon: SaChristene LyeMD;  [facility];  Service: General;  Laterality: N/A;  . COLONOSCOPY WITH PROPOFOL N/A 04/27/2016   Procedure: COLONOSCOPY WITH PROPOFOL;  Surgeon: DaLucilla LameMD;  [place] Service: Endoscopy;  Laterality: N/A;  . CORONARY ANGIOPLASTY    . CORONARY BALLOON ANGIOPLASTY N/A 06/29/2017   Procedure: CORONARY BALLOON ANGIOPLASTY;  Surgeon: ArWellington HampshireMD;  [place];  Service: Cardiovascular;  Laterality: N/A;  . CORONARY BALLOON ANGIOPLASTY N/A 09/20/2017   Procedure: CORONARY BALLOON ANGIOPLASTY;  Surgeon: ArWellington HampshireMD;  [facility];  Service: Cardiovascular;  Laterality: N/A;  . DILATION AND CURETTAGE OF UTERUS    . ESOPHAGOGASTRODUODENOSCOPY (EGD) WITH PROPOFOL N/A 09/14/2014   Procedure: ESOPHAGOGASTRODUODENOSCOPY (EGD) WITH PROPOFOL;  Surgeon: MaJosefine ClassMD;  [facility];  Service: Endoscopy;  Laterality: N/A;  . ESOPHAGOGASTRODUODENOSCOPY (EGD) WITH PROPOFOL N/A 04/27/2016   Procedure: ESOPHAGOGASTRODUODENOSCOPY (EGD) WITH PROPOFOL;  Surgeon: [name], MD;  Location: [place];  Service: Endoscopy;  Laterality: N/A;  Diabetic - oral meds  . GASTRIC BYPASS  09/2009   [facility]   . Left Carotid to sublcavian artery  bypass w/ subclavian artery ligation     a. Performed @ [facility].  . LEFT HEART CATH AND CORONARY ANGIOGRAPHY Left 06/29/2017   Procedure: LEFT HEART CATH AND CORONARY ANGIOGRAPHY;  Surgeon: Wellington Hampshire, MD;  Location: [place] CV LAB;  Service: Cardiovascular;  Laterality: Left;  . LEFT HEART CATH AND CORONARY ANGIOGRAPHY N/A 09/20/2017   Procedure: LEFT HEART CATH AND CORONARY ANGIOGRAPHY;  Surgeon: Wellington Hampshire, MD;  Location: [place] CV LAB;  Service: Cardiovascular;  Laterality: N/A;  . MELANOMA EXCISION  2016   Dr. Evorn Gong  . [place]  2002  . RIGHT OOPHORECTOMY    . SHOULDER ARTHROSCOPY WITH OPEN ROTATOR CUFF REPAIR Right 01/07/2016   Procedure: SHOULDER ARTHROSCOPY WITH DEBRIDMENT, SUBACHROMIAL DECOMPRESSION;  Surgeon: Corky Mull, MD;  [facility];  Service: Orthopedics;  Laterality: Right;  . SHOULDER ARTHROSCOPY WITH OPEN ROTATOR CUFF REPAIR Right 03/16/2017   Procedure: SHOULDER ARTHROSCOPY WITH OPEN ROTATOR CUFF REPAIR POSSIBLE BICEPS TENODESIS;  Surgeon: Corky Mull, MD;  Location: [facility];  Service: Orthopedics;  Laterality: Right;  . TRIGGER FINGER RELEASE Right     Middle Finger    There were no vitals filed for this visit.     Subjective: Patient behavior: (alertness, ability to follow instructions, etc.): The patient is alert, able to verbalize her swallowing complaints, and follow directions.  Chief complaint:  Patient reports food/liquid get stuck causing choking and coughing   Objective:  Radiological Procedure: A videoflouroscopic evaluation of oral-preparatory, reflex initiation, and pharyngeal phases of the swallow was performed; as well as a screening of the upper esophageal phase.  I. POSTURE: Upright in MBS chair  II. VIEW: Lateral  III. COMPENSATORY STRATEGIES: N/A  IV. BOLUSES ADMINISTERED:   Thin Liquid: 1 small, 3 rapid consecutive   Nectar-thick Liquid: 1 moderate   Honey-thick Liquid: DNT   Puree: 2  teaspoon presentations   Mechanical Soft: 1/4 graham cracker in applesauce  V. RESULTS OF EVALUATION: A. ORAL PREPARATORY PHASE: (The lips, tongue, and velum are observed for strength and coordination)       **Overall Severity Rating: Minimal; slight disorganization with mild prolonged posterior transfer  B. SWALLOW INITIATION/REFLEX: (The reflex is normal if "triggered" by the time the bolus reached the base of the tongue)  **Overall Severity Rating: Mild; triggers while falling from the valleculae to the pyriform sinuses  C. PHARYNGEAL PHASE: (Pharyngeal function is normal if the bolus shows rapid, smooth, and continuous transit through the pharynx and there is no pharyngeal residue after the swallow)  **Overall Severity Rating: within normal limits   D. LARYNGEAL PENETRATION: (Material entering into the laryngeal inlet/vestibule but not aspirated): Transient penetration with 3/5 liquid boluses   E. ASPIRATION: NONE  F. ESOPHAGEAL PHASE: (Screening of the upper esophagus) Per radiologist report: "Small esophageal web traversing approximately 1/3 of the cervical esophageal lumen anteriorly at the C5-6 level."    ASSESSMENT: This 47 year old woman; with history of strokes and complaint of solids and liquids getting stuck and causing choking/coughing; is presenting with minimal oropharyngeal dysphagia characterized by delayed pharyngeal swallow initiation and transient laryngeal penetration.  Oral  control of the bolus including oral hold, rotary mastication, and anterior to posterior transfer mildly slowed but is within functional limits.  Aspects of the pharyngeal stage of swallowing including tongue base retraction, hyolaryngeal excursion, epiglottic inversion, and duration/amplitude of UES opening are within normal limits.  There is no pharyngeal residue or tracheal aspiration.  The patient's complaints do not appear to be due to oropharyngeal swallow function.  Per radiologist report: "Small  esophageal web traversing approximately 1/3 of the cervical esophageal lumen anteriorly at the C5-6 level."  The patient was counseled that her swallowing is safe from an oropharyngeal point of view and she is not at risk for prandial aspiration or negative sequelae to aspiration.    PLAN/RECOMMENDATIONS:   A. Diet: Regular   B. Swallowing Precautions: Small bites/sips, chew well   C. Recommended consultation to: follow up with GI as recommended   D. Therapy recommendations: speech therapy is not indicated    E. Results and recommendations were discussed with the patient immediately following the study and the final report routed to the referring practitioner.    Oropharyngeal dysphagia - Plan: DG OP Swallowing Func-Medicare/Speech Path, DG OP Swallowing Func-Medicare/Speech Path, CANCELED: DG Swallowing Func-Speech Pathology, CANCELED: DG Swallowing Func-Speech Pathology        Problem List Patient Active Problem List   Diagnosis Date Noted  . MI, acute, non ST segment elevation ([place])   . Effort angina ([place]) 06/29/2017  . Unstable angina ([place]) 06/24/2017  . Syncope 04/09/2017  . Insomnia 03/18/2017  . Ischemic cardiomyopathy   . Arthritis   . Anxiety   . Acute colitis 01/27/2017  . Hx of colonic polyps   . Bariatric surgery status   . Sinus tachycardia 02/27/2016  . H/O medication noncompliance 12/14/2015  . Acute pyelonephritis   . Emesis   . CKD (chronic kidney disease), stage IV ([place])   . Atherosclerosis of native coronary artery of native heart with stable angina pectoris ([place])   . Hypertensive heart disease   . CKD (chronic kidney disease), stage III ([place]) 06/05/2015  . Presence of drug coated stent in LAD coronary artery & now in D1 06/04/2015  . Colitis 06/03/2015  . Carotid stenosis 04/30/2015  . Type 2 diabetes mellitus with stage 3 chronic kidney disease, without long-term current use of insulin ([place])   . Atypical chest pain   . Stable angina pectoris  ([place]) 04/17/2015  . Iron deficiency anemia 03/22/2015  . Vitamin B12 deficiency 02/18/2015  . Misuse of medications for pain 02/18/2015  . Severe recurrent major depression without psychotic features ([name]) 02/15/2015  . Helicobacter pylori infection 11/23/2014  . Hemiparesis, left ([place]) 11/23/2014  . Benign neoplasm of colon 11/20/2014  . Malignant melanoma ([place]) 08/25/2014  . Chronic systolic CHF (congestive heart failure) ([place])   . Incomplete bladder emptying 07/12/2014  . Adult hypothyroidism 12/30/2013  . Aberrant subclavian artery 11/17/2013  . Multiple sclerosis ([place]) 11/02/2013  . History of CVA (cerebrovascular accident) 06/20/2013  . Headache, migraine 05/29/2013  . Hyperlipidemia   . GERD (gastroesophageal reflux disease)   . Neuropathy ([place]) 01/02/2011  . CVA (cerebral vascular accident) ([place]) 06/21/2008  . Essential hypertension 05/01/2008   Leroy Sea, MS/CCC- SLP  Lou Miner 11/29/2017, 1:35 PM  [place], [place], [address] Phone: [phone]   Fax:     Name: Skylah Delauter MRN: [id number] Date of Birth: Jul 02, 1970

## 2017-11-30 DIAGNOSIS — S62653A Nondisplaced fracture of medial phalanx of left middle finger, initial encounter for closed fracture: Secondary | ICD-10-CM | POA: Diagnosis not present

## 2017-11-30 DIAGNOSIS — M25542 Pain in joints of left hand: Secondary | ICD-10-CM | POA: Diagnosis not present

## 2017-12-02 DIAGNOSIS — M533 Sacrococcygeal disorders, not elsewhere classified: Secondary | ICD-10-CM | POA: Insufficient documentation

## 2017-12-02 DIAGNOSIS — M545 Low back pain: Secondary | ICD-10-CM | POA: Diagnosis not present

## 2017-12-13 ENCOUNTER — Ambulatory Visit: Payer: Medicare Other | Admitting: Cardiovascular Disease

## 2017-12-14 ENCOUNTER — Other Ambulatory Visit: Payer: Self-pay | Admitting: Family Medicine

## 2017-12-14 DIAGNOSIS — F5102 Adjustment insomnia: Secondary | ICD-10-CM

## 2017-12-14 DIAGNOSIS — F439 Reaction to severe stress, unspecified: Secondary | ICD-10-CM

## 2017-12-17 ENCOUNTER — Emergency Department
Admission: EM | Admit: 2017-12-17 | Discharge: 2017-12-17 | Disposition: A | Discharge status: Home / Self Care | Payer: Medicare Other | Attending: Emergency Medicine | Admitting: Emergency Medicine

## 2017-12-17 ENCOUNTER — Other Ambulatory Visit: Payer: Self-pay

## 2017-12-17 ENCOUNTER — Telehealth: Payer: Self-pay | Admitting: Cardiovascular Disease

## 2017-12-17 ENCOUNTER — Emergency Department: Payer: Medicare Other

## 2017-12-17 ENCOUNTER — Ambulatory Visit (INDEPENDENT_AMBULATORY_CARE_PROVIDER_SITE_OTHER): Payer: Medicare Other | Admitting: Family Medicine

## 2017-12-17 ENCOUNTER — Other Ambulatory Visit
Admission: RE | Admit: 2017-12-17 | Discharge: 2017-12-17 | Disposition: A | Discharge status: Home / Self Care | Payer: Medicare Other | Source: Ambulatory Visit | Attending: Family Medicine | Admitting: Family Medicine

## 2017-12-17 ENCOUNTER — Encounter: Payer: Self-pay | Admitting: Family Medicine

## 2017-12-17 VITALS — BP 132/96 | HR 117 | Temp 98.2°F | Wt 172.0 lb

## 2017-12-17 DIAGNOSIS — Z79899 Other long term (current) drug therapy: Secondary | ICD-10-CM | POA: Diagnosis not present

## 2017-12-17 DIAGNOSIS — R519 Headache, unspecified: Secondary | ICD-10-CM

## 2017-12-17 DIAGNOSIS — E039 Hypothyroidism, unspecified: Secondary | ICD-10-CM | POA: Insufficient documentation

## 2017-12-17 DIAGNOSIS — I208 Other forms of angina pectoris: Secondary | ICD-10-CM

## 2017-12-17 DIAGNOSIS — E1122 Type 2 diabetes mellitus with diabetic chronic kidney disease: Secondary | ICD-10-CM | POA: Diagnosis not present

## 2017-12-17 DIAGNOSIS — I252 Old myocardial infarction: Secondary | ICD-10-CM | POA: Insufficient documentation

## 2017-12-17 DIAGNOSIS — Z7982 Long term (current) use of aspirin: Secondary | ICD-10-CM | POA: Diagnosis not present

## 2017-12-17 DIAGNOSIS — N183 Chronic kidney disease, stage 3 (moderate): Secondary | ICD-10-CM | POA: Insufficient documentation

## 2017-12-17 DIAGNOSIS — Z7902 Long term (current) use of antithrombotics/antiplatelets: Secondary | ICD-10-CM | POA: Diagnosis not present

## 2017-12-17 DIAGNOSIS — I2510 Atherosclerotic heart disease of native coronary artery without angina pectoris: Secondary | ICD-10-CM | POA: Diagnosis not present

## 2017-12-17 DIAGNOSIS — R5383 Other fatigue: Secondary | ICD-10-CM

## 2017-12-17 DIAGNOSIS — R51 Headache: Secondary | ICD-10-CM

## 2017-12-17 DIAGNOSIS — I200 Unstable angina: Secondary | ICD-10-CM

## 2017-12-17 DIAGNOSIS — R079 Chest pain, unspecified: Secondary | ICD-10-CM | POA: Diagnosis not present

## 2017-12-17 DIAGNOSIS — Z87891 Personal history of nicotine dependence: Secondary | ICD-10-CM | POA: Diagnosis not present

## 2017-12-17 DIAGNOSIS — I10 Essential (primary) hypertension: Secondary | ICD-10-CM | POA: Diagnosis not present

## 2017-12-17 DIAGNOSIS — Z8673 Personal history of transient ischemic attack (TIA), and cerebral infarction without residual deficits: Secondary | ICD-10-CM | POA: Diagnosis not present

## 2017-12-17 DIAGNOSIS — I129 Hypertensive chronic kidney disease with stage 1 through stage 4 chronic kidney disease, or unspecified chronic kidney disease: Secondary | ICD-10-CM | POA: Diagnosis not present

## 2017-12-17 LAB — CBC
HCT: 41.3 % (ref 36.0–46.0)
HEMOGLOBIN: 13.2 g/dL (ref 12.0–15.0)
MCH: 26.7 pg (ref 26.0–34.0)
MCHC: 32 g/dL (ref 30.0–36.0)
MCV: 83.4 fL (ref 80.0–100.0)
Platelets: 191 10*3/uL (ref 150–400)
RBC: 4.95 MIL/uL (ref 3.87–5.11)
RDW: 14.4 % (ref 11.5–15.5)
WBC: 12.5 10*3/uL — AB (ref 4.0–10.5)
nRBC: 0 % (ref 0.0–0.2)

## 2017-12-17 LAB — BASIC METABOLIC PANEL
Anion gap: 7 (ref 5–15)
BUN: 13 mg/dL (ref 6–20)
CHLORIDE: 110 mmol/L (ref 98–111)
CO2: 21 mmol/L — AB (ref 22–32)
CREATININE: 1.58 mg/dL — AB (ref 0.44–1.00)
Calcium: 8.8 mg/dL — ABNORMAL LOW (ref 8.9–10.3)
GFR calc Af Amer: 43 mL/min — ABNORMAL LOW (ref >60)
GFR calc non Af Amer: 37 mL/min — ABNORMAL LOW (ref >60)
GLUCOSE: 140 mg/dL — AB (ref 70–99)
POTASSIUM: 4.3 mmol/L (ref 3.5–5.1)
SODIUM: 138 mmol/L (ref 135–145)

## 2017-12-17 LAB — TROPONIN I: Troponin I: <0.03 ng/mL (ref <0.03)

## 2017-12-17 MED ORDER — ONDANSETRON HCL 4 MG/2ML IJ SOLN
4.0000 mg | Freq: Once | INTRAMUSCULAR | Status: AC
  Administered 2017-12-17: 4 mg via INTRAVENOUS
  Filled 2017-12-17: qty 2

## 2017-12-17 MED ORDER — ACETAMINOPHEN 325 MG PO TABS
650.0000 mg | ORAL_TABLET | Freq: Once | ORAL | Status: AC
  Administered 2017-12-17: 650 mg via ORAL
  Filled 2017-12-17: qty 2

## 2017-12-17 MED ORDER — MORPHINE SULFATE (PF) 4 MG/ML IV SOLN
4.0000 mg | Freq: Once | INTRAVENOUS | Status: AC
  Administered 2017-12-17: 4 mg via INTRAVENOUS
  Filled 2017-12-17: qty 1

## 2017-12-17 MED ORDER — RANOLAZINE ER 1000 MG PO TB12: ORAL_TABLET | ORAL | 0 refills | Status: DC

## 2017-12-17 NOTE — ED Triage Notes (Signed)
Pt arrives from outpatient lab c/o non reproducible chest pain radiating to L arm. Pt states int. CP over last several months, this episode starting at 6am this morning, reports relief after 3 nitroglycerins. Denies SOB. Protocol initiated.

## 2017-12-17 NOTE — Progress Notes (Signed)
Approximate time. Responded to overhead page for Medical Alert in Outpatient Lab. On arrival found patient sitting in lab draw chair. Pt alert/oriented,no respiratory distress, states having chest pain from chest to left arm, has cardiac history with stent placement.States was unable to get out of bed yesterday due to chest pain. Agreed to be evaluated in ED.Stand/pivot to wheelchair. Taken to ED room 2, no deterioration during transport. Apolinar Junes, RN SWOT RN, Romie Minus RN ED Charge RN, multiple staff from [facility] service present to assist.RN present in ED room on arrival.

## 2017-12-17 NOTE — Discharge Instructions (Addendum)
Return to the emergency department immediately for any worsening condition including uncontrolled chest pain, any association with nausea, sweats, dizziness or passing out, trouble breathing, or any other symptoms concerning to you.

## 2017-12-17 NOTE — Progress Notes (Signed)
   12/17/17 1030  Clinical Encounter Type  Visited With Patient  Visit Type Initial;Spiritual support;Code;ED  Referral From Nurse  Consult/Referral To Chaplain  Spiritual Encounters  Spiritual Needs Prayer;Other (Comment)   [facility] responded to a medical alert in the out-patient lab. [name] was experiencing chest pains and was transferred to the ED. I provided a pastoral presence and prayed silently for the patient and staff.

## 2017-12-17 NOTE — Progress Notes (Signed)
Patient: [name] Female    DOB: January 14, 1971   47 y.o.   MRN: [id number] Visit Date: 12/17/2017  Today's Provider: [name], MD   Chief Complaint  Patient presents with  . Headache    Started about 5 days ago.  (12/13/2017)   Subjective:    Headache   This is a new problem. The current episode started in the past 7 days. The pain is located in the frontal region. The pain quality is not similar to prior headaches. The quality of the pain is described as stabbing. Associated symptoms include dizziness, drainage and rhinorrhea. Pertinent negatives include no abdominal pain, back pain, coughing, ear pain, fever, hearing loss, nausea, neck pain, numbness, phonophobia, photophobia, sinus pressure, sore throat, swollen glands, tinnitus, vomiting or weakness. She has tried nothing for the symptoms.    She reports she has been having chest pains for several months and taking nitroglycerine 1-2 times a day for the last week. Very fatigued this week. Somewhat nauseated, but no vomiting.   Headaches are above both eyes. Feels very fatigued. Having chills and sweats the last 3-4 days. No sore throat or cough. Having some sinus drainage, no congestion.     Allergies  Allergen Reactions  . Lipitor [Atorvastatin] Other (See Comments)    Leg pains  . Tramadol Other (See Comments)    Mouth feels like it's on fire     Current Outpatient Medications:  .  ALPRAZolam (XANAX) 1 MG tablet, TAKE 1 TABLET (1 MG TOTAL) BY MOUTH DAILY AS NEEDED FOR ANXIETY., Disp: 30 tablet, Rfl: 5 .  amLODipine (NORVASC) 5 MG tablet, Take 1 tablet (5 mg total) by mouth daily., Disp: 90 tablet, Rfl: 6 .  aspirin 81 MG chewable tablet, Chew 1 tablet (81 mg total) by mouth daily., Disp: 30 tablet, Rfl: 11 .  buPROPion (WELLBUTRIN XL) 150 MG 24 hr tablet, TAKE 1 TABLET BY MOUTH DAILY, Disp: 30 tablet, Rfl: 11 .  Calcium Carbonate-Vitamin D (CALCIUM 500/D PO), Take 500 mg by mouth daily. , Disp: , Rfl:  .   calcium-vitamin D (OSCAL 500/200 D-3) 500-200 MG-UNIT tablet, Take 1 tablet by mouth 2 (two) times daily., Disp: 60 tablet, Rfl: 0 .  citalopram (CELEXA) 40 MG tablet, Take 1 tablet (40 mg total) by mouth daily., Disp: 30 tablet, Rfl: 5 .  clopidogrel (PLAVIX) 75 MG tablet, Take 1 tablet (75 mg total) by mouth daily with breakfast., Disp: 30 tablet, Rfl: 2 .  Cyanocobalamin (VITAMIN B 12 PO), Take 1,000 mcg by mouth daily. , Disp: , Rfl:  .  gabapentin (NEURONTIN) 300 MG capsule, TAKE 1 CAPSULE BY MOUTH TWICE A DAY, Disp: 60 capsule, Rfl: 5 .  hyoscyamine (LEVSIN SL) 0.125 MG SL tablet, Place 1 tablet (0.125 mg total) under the tongue every 6 (six) hours as needed. (Patient taking differently: Place 0.125 mg under the tongue every 6 (six) hours as needed for cramping. ), Disp: 60 tablet, Rfl: 0 .  isosorbide mononitrate (IMDUR) 120 MG 24 hr tablet, Take 1 tablet (120 mg total) by mouth daily., Disp: 90 tablet, Rfl: 1 .  levothyroxine (SYNTHROID, LEVOTHROID) 25 MCG tablet, Take 1 tablet (25 mcg total) daily before breakfast by mouth., Disp: 30 tablet, Rfl: 12 .  lipase/protease/amylase (CREON) 36000 UNITS CPEP capsule, Take 2 tablets with first bite of each meal, and 1 tablet with first bite of a snack., Disp: , Rfl:  .  losartan (COZAAR) 25 MG tablet, Take  1/2 tablet (12.5 mg) by mouth once daily, Disp: 45 tablet, Rfl: 1 .  metoprolol succinate (TOPROL-XL) 50 MG 24 hr tablet, Take 1 tablet (50 mg total) by mouth daily. Take with or immediately following a meal., Disp: 30 tablet, Rfl: 3 .  Multiple Vitamin (MULTIVITAMIN WITH MINERALS) TABS tablet, Take 1 tablet by mouth daily., Disp: , Rfl:  .  nitroGLYCERIN (NITROSTAT) 0.4 MG SL tablet, Place 1 tablet (0.4 mg total) under the tongue every 5 (five) minutes as needed for chest pain., Disp: 25 tablet, Rfl: 2 .  pantoprazole (PROTONIX) 40 MG tablet, Take 1 tablet (40 mg total) by mouth 2 (two) times daily., Disp: 60 tablet, Rfl: 5 .  promethazine  (PHENERGAN) 25 MG tablet, Take 1 tablet (25 mg total) by mouth every 6 (six) hours as needed for nausea or vomiting., Disp: 30 tablet, Rfl: 3 .  pyridostigmine (MESTINON) 60 MG tablet, Take 60 mg by mouth 3 (three) times daily. , Disp: , Rfl:  .  rosuvastatin (CRESTOR) 10 MG tablet, Take 1 tablet (10 mg total) by mouth daily., Disp: 30 tablet, Rfl: 11 .  Blood Glucose Monitoring Suppl (ONE TOUCH ULTRA 2) w/Device KIT, Use to check blood sugar once a day. Dx. E11.9, Disp: 1 each, Rfl: 0  Review of Systems  Constitutional: Positive for chills and fatigue. Negative for activity change, appetite change, diaphoresis, fever and unexpected weight change.  HENT: Positive for postnasal drip and rhinorrhea. Negative for congestion, ear discharge, ear pain, hearing loss, sinus pressure, sinus pain, sore throat, tinnitus, trouble swallowing and voice change.   Eyes: Negative.  Negative for photophobia.  Respiratory: Negative.  Negative for cough.   Cardiovascular: Negative.   Gastrointestinal: Positive for diarrhea (Pt report having diarrhea for six months.  She states this is stable. ). Negative for abdominal distention, abdominal pain, anal bleeding, blood in stool, constipation, nausea, rectal pain and vomiting.  Musculoskeletal: Positive for myalgias. Negative for arthralgias, back pain, gait problem, joint swelling, neck pain and neck stiffness.  Neurological: Positive for dizziness, light-headedness and headaches. Negative for weakness and numbness.    Social History   Tobacco Use  . Smoking status: Former Smoker    Types: Cigarettes    Last attempt to quit: 08/31/1994    Years since quitting: 23.3  . Smokeless tobacco: Never Used  . Tobacco comment: quit 28 years ago  Substance Use Topics  . Alcohol use: No    Alcohol/week: 0.0 standard drinks   Objective:   BP (!) 132/96 (BP Location: Right Arm, Patient Position: Sitting, Cuff Size: Normal)   Pulse (!) 117   Temp 98.2 F (36.8 C) (Oral)    Wt 172 lb (78 kg)   SpO2 99%   BMI 31.46 kg/m  Vitals:   12/17/17 0911  BP: (!) 132/96  Pulse: (!) 117  Temp: 98.2 F (36.8 C)  TempSrc: Oral  SpO2: 99%  Weight: 172 lb (78 kg)     Physical Exam  General Appearance:    Alert, cooperative, no distress  HENT:   ENT exam normal, no neck nodes or sinus tenderness  Eyes:    PERRL, conjunctiva/corneas clear, EOM's intact       Lungs:     Clear to auscultation bilaterally, respirations unlabored  Heart:   Tachycardia  Neurologic:   Awake, alert, oriented x 3. No apparent focal neurological           defect.      EKG: SR     Assessment &  Plan:     1. Other fatigue Associated with headaches, and chills, but no cough or sore throat. Suspect viral syndrome. Check labs are [facility] outpatient  - Comprehensive metabolic panel - TSH - CBC with Differential/Platelet  2. Nonintractable headache, unspecified chronicity pattern, unspecified headache type   3. Essential hypertension Well controlled.  Continue current medications.   - EKG 12-Lead  4. Stable angina (HCC) Continue routine follow up with Dr. [name]. Go to er if she needs to take more than 2 nitroglycerine tablets to resolve pain.        [name], MD  [facility]

## 2017-12-17 NOTE — Telephone Encounter (Signed)
States she saw PCP today for chest pain and had blood work done Patient is on her 3rd nitro and would like to know if she should go to ER Transferred to [employer]

## 2017-12-17 NOTE — Telephone Encounter (Signed)
Patient called in stating that she is having active chest pain and has taken 3 nitroglycerin tablets with no relief. She states that she did not to PCP office earlier today and had mentioned it to him as well. She states that they did order some labs but states that Dr. [name] told her not to take more than 3 nitroglycerins. She wanted to know if she should go to ED. Advised that she should proceed to the ED for further work up and evaluation given her strong history and recent cath back in August. She was agreeable with plan and had no further questions at this time. She did want Dr. [name] to know she is going to ED. Will route to provider for review.

## 2017-12-17 NOTE — ED Provider Notes (Signed)
[facility] Emergency Department Provider Note ____________________________________________   I have reviewed the triage vital signs and the triage nursing note.  HISTORY  Chief Complaint Chest Pain   Historian Patient  HPI [name] is a 47 y.o. female presents to the ER for left-sided chest pain which goes down the left arm.  This episode has been going on since exam this morning.  She went to her primary care doctor's office Dr. Caryn Section who reportedly checked an EKG looked normal and sent to the hospital for labs to be drawn.  Given that she was continued to complain of left-sided chest pain, she was brought to the ED for further evaluation.  Patient states the pain is moderate.  She took 3 sublingual nitros this morning which did not help all that much.      Past Medical History:  Diagnosis Date  . Anemia    iron deficiency anemia  . Aortic arch aneurysm ([place])   . Bipolar disorder ([place])   . BRCA negative 2014  . CAD (coronary artery disease)    a. 08/2003 Cath: LAD 30-40-med Rx; b. 11/2014 PCI: LAD 78m(3.25x23 Xience Alpine DES); c. 06/2015 PCI: D1 (2.25x12 Resolute Integrity DES); d. 06/2017 PCI: Patent mLAD stent, D2 95 (PTCA); e. 09/2017 Cath/PCI: LM nl, LAD 379m70d, D1 40, D2 99ost (CBA), LCX 40p, RCA 40ost/p.  . Marland KitchenKD (chronic kidney disease), stage III (HCWest Glacier  . Colon polyp   . CVA (cerebral vascular accident) (HCBeaufort   Left side weakness.   . Diabetes (HCMcMullen  . Family history of breast cancer    BRCA neg 2014  . Gastric ulcer 04/27/2011  . History of echocardiogram    a. 03/2017 Echo: EF 60-65%, no rwma.  . Marland KitchenTN (hypertension)   . Hyperlipemia   . Hypothyroid   . Malignant melanoma of skin of scalp (HCMenahga  . MI, acute, non ST segment elevation (HCWeskan  . Neuromuscular disorder (HCBurlington  . S/P drug eluting coronary stent placement 06/04/2015  . Sepsis (HCGray2/14/2019    Patient Active Problem List   Diagnosis Date Noted  . MI, acute,  non ST segment elevation (HCGibbon  . Effort angina (HCHarrisburg05/28/2019  . Unstable angina (HCOcta05/23/2019  . Syncope 04/09/2017  . Insomnia 03/18/2017  . Ischemic cardiomyopathy   . Arthritis   . Anxiety   . Acute colitis 01/27/2017  . Hx of colonic polyps   . Bariatric surgery status   . Sinus tachycardia 02/27/2016  . H/O medication noncompliance 12/14/2015  . Acute pyelonephritis   . Emesis   . CKD (chronic kidney disease), stage IV (HCTerryville  . Atherosclerosis of native coronary artery of native heart with stable angina pectoris (HCVandiver  . Hypertensive heart disease   . CKD (chronic kidney disease), stage III (HCLoghill Village05/04/2015  . Presence of drug coated stent in LAD coronary artery & now in D1 06/04/2015  . Colitis 06/03/2015  . Carotid stenosis 04/30/2015  . Type 2 diabetes mellitus with stage 3 chronic kidney disease, without long-term current use of insulin (HCArab  . Atypical chest pain   . Stable angina pectoris (HCCove03/15/2017  . Iron deficiency anemia 03/22/2015  . Vitamin B12 deficiency 02/18/2015  . Misuse of medications for pain 02/18/2015  . Severe recurrent major depression without psychotic features (HCToole01/13/2017  . Helicobacter pylori infection 11/23/2014  . Hemiparesis, left (HCVinegar Bend10/21/2016  . Benign neoplasm of colon  11/20/2014  . Malignant melanoma ([place]) 08/25/2014  . Chronic systolic CHF (congestive heart failure) ([place])   . Incomplete bladder emptying 07/12/2014  . Adult hypothyroidism 12/30/2013  . Aberrant subclavian artery 11/17/2013  . Multiple sclerosis ([place]) 11/02/2013  . History of CVA (cerebrovascular accident) 06/20/2013  . Headache, migraine 05/29/2013  . Hyperlipidemia   . GERD (gastroesophageal reflux disease)   . Neuropathy ([place]) 01/02/2011  . CVA (cerebral vascular accident) ([place]) 06/21/2008  . Essential hypertension 05/01/2008    Past Surgical History:  Procedure Laterality Date  . APPENDECTOMY    . CARDIAC CATHETERIZATION N/A  11/09/2014   Procedure: Coronary Angiography;  Surgeon: Minna Merritts, MD;  Location: [place] [facility];  Service: Cardiovascular;  Laterality: N/A;  . CARDIAC CATHETERIZATION N/A 11/12/2014   Procedure: Coronary Stent Intervention;  Surgeon: Isaias Cowman, MD;  Location: [place] [facility];  Service: Cardiovascular;  Laterality: N/A;  . CARDIAC CATHETERIZATION N/A 04/18/2015   Procedure: Left Heart Cath and Coronary Angiography;  Surgeon: Minna Merritts, MD;  Location: [place] CV LAB;  Service: Cardiovascular;  Laterality: N/A;  . CARDIAC CATHETERIZATION Left 06/04/2015   Procedure: Left Heart Cath and Coronary Angiography;  Surgeon: Wellington Hampshire, MD;  Location: [place] [facility];  Service: Cardiovascular;  Laterality: Left;  . CARDIAC CATHETERIZATION N/A 06/04/2015   Procedure: Coronary Stent Intervention;  Surgeon: Wellington Hampshire, MD;  Location: [place] CV LAB;  Service: Cardiovascular;  Laterality: N/A;  . CESAREAN SECTION  2001  . CHOLECYSTECTOMY N/A 11/18/2016   Procedure: LAPAROSCOPIC CHOLECYSTECTOMY WITH INTRAOPERATIVE CHOLANGIOGRAM;  Surgeon: Christene Lye, MD;  Location: [facility] ORS;  Service: General;  Laterality: N/A;  . COLONOSCOPY WITH PROPOFOL N/A 04/27/2016   Procedure: COLONOSCOPY WITH PROPOFOL;  Surgeon: [name], MD;  Location: [place];  Service: Endoscopy;  Laterality: N/A;  . CORONARY ANGIOPLASTY    . CORONARY BALLOON ANGIOPLASTY N/A 06/29/2017   Procedure: CORONARY BALLOON ANGIOPLASTY;  Surgeon: Wellington Hampshire, MD;  Location: [place] CV LAB;  Service: Cardiovascular;  Laterality: N/A;  . CORONARY BALLOON ANGIOPLASTY N/A 09/20/2017   Procedure: CORONARY BALLOON ANGIOPLASTY;  Surgeon: Wellington Hampshire, MD;  Location: [place] [facility];  Service: Cardiovascular;  Laterality: N/A;  . DILATION AND CURETTAGE OF UTERUS    . ESOPHAGOGASTRODUODENOSCOPY (EGD) WITH PROPOFOL N/A 09/14/2014   Procedure:  ESOPHAGOGASTRODUODENOSCOPY (EGD) WITH PROPOFOL;  Surgeon: Josefine Class, MD;  Location: [facility] ENDOSCOPY;  Service: Endoscopy;  Laterality: N/A;  . ESOPHAGOGASTRODUODENOSCOPY (EGD) WITH PROPOFOL N/A 04/27/2016   Procedure: ESOPHAGOGASTRODUODENOSCOPY (EGD) WITH PROPOFOL;  Surgeon: [name], MD;  Location: [place];  Service: Endoscopy;  Laterality: N/A;  Diabetic - oral meds  . GASTRIC BYPASS  09/2009   [facility]   . Left Carotid to sublcavian artery bypass w/ subclavian artery ligation     a. Performed @ [place].  . LEFT HEART CATH AND CORONARY ANGIOGRAPHY Left 06/29/2017   Procedure: LEFT HEART CATH AND CORONARY ANGIOGRAPHY;  Surgeon: Wellington Hampshire, MD;  Location: [place] CV LAB;  Service: Cardiovascular;  Laterality: Left;  . LEFT HEART CATH AND CORONARY ANGIOGRAPHY N/A 09/20/2017   Procedure: LEFT HEART CATH AND CORONARY ANGIOGRAPHY;  Surgeon: Wellington Hampshire, MD;  Location: [place] CV LAB;  Service: Cardiovascular;  Laterality: N/A;  . MELANOMA EXCISION  2016   Dr. Evorn Gong  . [place]  2002  . RIGHT OOPHORECTOMY    . SHOULDER ARTHROSCOPY WITH OPEN ROTATOR CUFF REPAIR Right  01/07/2016   Procedure: SHOULDER ARTHROSCOPY WITH DEBRIDMENT, SUBACHROMIAL DECOMPRESSION;  Surgeon: Corky Mull, MD;  Location: [facility] ORS;  Service: Orthopedics;  Laterality: Right;  . SHOULDER ARTHROSCOPY WITH OPEN ROTATOR CUFF REPAIR Right 03/16/2017   Procedure: SHOULDER ARTHROSCOPY WITH OPEN ROTATOR CUFF REPAIR POSSIBLE BICEPS TENODESIS;  Surgeon: Corky Mull, MD;  Location: [facility] ORS;  Service: Orthopedics;  Laterality: Right;  . TRIGGER FINGER RELEASE Right     [place]    Prior to Admission medications   Medication Sig Start Date End Date Taking? Authorizing Provider  ALPRAZolam (XANAX) 1 MG tablet TAKE 1 TABLET (1 MG TOTAL) BY MOUTH DAILY AS NEEDED FOR ANXIETY. 12/14/17  Yes [name], MD  amLODipine (NORVASC) 5 MG tablet Take 1 tablet (5 mg  total) by mouth daily. 11/29/17  Yes Minna Merritts, MD  aspirin 81 MG chewable tablet Chew 1 tablet (81 mg total) by mouth daily. 07/01/17  Yes Dunn, Areta Haber, PA-C  Blood Glucose Monitoring Suppl (ONE TOUCH ULTRA 2) w/Device KIT Use to check blood sugar once a day. Dx. E11.9 03/18/17  Yes [name], MD  buPROPion (WELLBUTRIN XL) 150 MG 24 hr tablet TAKE 1 TABLET BY MOUTH DAILY 08/10/17  Yes [name], MD  calcium-vitamin D (OSCAL 500/200 D-3) 500-200 MG-UNIT tablet Take 1 tablet by mouth 2 (two) times daily. 11/04/17 11/04/18 Yes Cuthriell, Charline Bills, PA-C  citalopram (CELEXA) 40 MG tablet Take 1 tablet (40 mg total) by mouth daily. 06/08/17  Yes [name], MD  clopidogrel (PLAVIX) 75 MG tablet Take 1 tablet (75 mg total) by mouth daily with breakfast. 07/01/17  Yes Dunn, Areta Haber, PA-C  Cyanocobalamin (VITAMIN B 12 PO) Take 1,000 mcg by mouth daily.    Yes [provider]  gabapentin (NEURONTIN) 300 MG capsule TAKE 1 CAPSULE BY MOUTH TWICE A DAY Patient taking differently: Take 300 mg by mouth 2 (two) times daily.  10/30/17  Yes [name], MD  hyoscyamine (LEVSIN SL) 0.125 MG SL tablet Place 1 tablet (0.125 mg total) under the tongue every 6 (six) hours as needed. Patient taking differently: Place 0.125 mg under the tongue every 6 (six) hours as needed for cramping.  02/01/17  Yes [name], MD  isosorbide mononitrate (IMDUR) 120 MG 24 hr tablet Take 1 tablet (120 mg total) by mouth daily. 10/13/17  Yes Dunn, Areta Haber, PA-C  levothyroxine (SYNTHROID, LEVOTHROID) 25 MCG tablet Take 1 tablet (25 mcg total) daily before breakfast by mouth. 12/18/16  Yes [name], MD  lipase/protease/amylase (CREON) 36000 UNITS CPEP capsule Take 36,000-72,000 Units by mouth See admin instructions. Take 2 capsules (72,000u) three times daily with first bite of meals and 1 capsule (36,000u) daily with first bite of snacks 11/24/17  Yes [provider]  losartan (COZAAR) 25  MG tablet Take 1/2 tablet (12.5 mg) by mouth once daily 10/13/17  Yes Dunn, Areta Haber, PA-C  metoprolol succinate (TOPROL-XL) 50 MG 24 hr tablet Take 1 tablet (50 mg total) by mouth daily. Take with or immediately following a meal. 07/01/17  Yes Dunn, Areta Haber, PA-C  Multiple Vitamin (MULTIVITAMIN WITH MINERALS) TABS tablet Take 1 tablet by mouth daily. 04/14/17  Yes Gouru, Illene Silver, MD  nitroGLYCERIN (NITROSTAT) 0.4 MG SL tablet Place 1 tablet (0.4 mg total) under the tongue every 5 (five) minutes as needed for chest pain. 06/02/17  Yes Gollan, Kathlene November, MD  pantoprazole (PROTONIX) 40 MG tablet Take 1 tablet (40 mg  total) by mouth 2 (two) times daily. 05/11/16  Yes [name], MD  promethazine (PHENERGAN) 25 MG tablet Take 1 tablet (25 mg total) by mouth every 6 (six) hours as needed for nausea or vomiting. 10/20/17  Yes [name], MD  pyridostigmine (MESTINON) 60 MG tablet Take 60 mg by mouth 3 (three) times daily.  07/26/17  Yes [provider]  rosuvastatin (CRESTOR) 10 MG tablet Take 1 tablet (10 mg total) by mouth daily. 08/10/17  Yes Minna Merritts, MD  ranolazine (RANEXA) 1000 MG SR tablet 1/2 tab twice per day for 1 week and then increase to 1 tablet twice per day 12/17/17   Lisa Roca, MD    Allergies  Allergen Reactions  . Lipitor [Atorvastatin] Other (See Comments)    Leg pains  . Tramadol Other (See Comments)    Mouth feels like it's on fire    Family History  Problem Relation Age of Onset  . Hypertension Mother   . Anxiety disorder Mother   . Depression Mother   . Bipolar disorder Mother   . Heart disease Mother        No details  . Hyperlipidemia Mother   . Kidney disease Father   . Heart disease Father 50  . Hypertension Father   . Diabetes Father   . Stroke Father   . Colon cancer Father        dx in his 47's  . Anxiety disorder Father   . Depression Father   . Skin cancer Father   . Kidney disease Sister   . Thyroid nodules Sister   . Hypertension  Sister   . Hypertension Sister   . Diabetes Sister   . Hyperlipidemia Sister   . Depression Sister   . Breast cancer Maternal Aunt 16  . Breast cancer Maternal Aunt 52  . Ovarian cancer Cousin   . Colon cancer Cousin   . Breast cancer Other   . Kidney cancer Neg Hx   . Bladder Cancer Neg Hx     Social History Social History   Tobacco Use  . Smoking status: Former Smoker    Types: Cigarettes    Last attempt to quit: 08/31/1994    Years since quitting: 23.3  . Smokeless tobacco: Never Used  . Tobacco comment: quit 28 years ago  Substance Use Topics  . Alcohol use: No    Alcohol/week: 0.0 standard drinks  . Drug use: No    Review of Systems  Constitutional: Negative for fever. Eyes: Negative for visual changes. ENT: Negative for sore throat. Cardiovascular: Positive as per hpi for chest pain. Respiratory: Negative for shortness of breath. Gastrointestinal: Negative for abdominal pain, vomiting and diarrhea. Genitourinary: Negative for dysuria. Musculoskeletal: Negative for back pain. Skin: Negative for rash. Neurological: Negative for headache.  ____________________________________________   PHYSICAL EXAM:  VITAL SIGNS: ED Triage Vitals  Enc Vitals Group     BP 12/17/17 1047 133/78     Pulse Rate 12/17/17 1047 77     Resp 12/17/17 1047 20     Temp 12/17/17 1054 98.6 F (37 C)     Temp Source 12/17/17 1054 Oral     SpO2 12/17/17 1047 99 %     Weight 12/17/17 1101 171 lb 15.3 oz (78 kg)     Height 12/17/17 1101 '5\' 2"'  (1.575 m)     Head Circumference --      Peak Flow --      Pain Score 12/17/17 1100 7  Pain Loc --      Pain Edu? --      Excl. in [place]? --      Constitutional: Alert and oriented.  HEENT      Head: Normocephalic and atraumatic.      Eyes: Conjunctivae are normal. Pupils equal and round.       Ears:         Nose: No congestion/rhinnorhea.      Mouth/Throat: Mucous membranes are moist.      Neck: No stridor. Cardiovascular/Chest:  Normal rate, regular rhythm.  No murmurs, rubs, or gallops. Respiratory: Normal respiratory effort without tachypnea nor retractions. Breath sounds are clear and equal bilaterally. No wheezes/rales/rhonchi. Gastrointestinal: Soft. No distention, no guarding, no rebound. Nontender.    Genitourinary/rectal:Deferred Musculoskeletal: Nontender with normal range of motion in all extremities. No joint effusions.  No lower extremity tenderness.  No edema. Neurologic:  Normal speech and language. No gross or focal neurologic deficits are appreciated. Skin:  Skin is warm, dry and intact. No rash noted. Psychiatric: Mood and affect are normal. Speech and behavior are normal. Patient exhibits appropriate insight and judgment.   ____________________________________________  LABS (pertinent positives/negatives) I, Lisa Roca, MD the attending physician have reviewed the labs noted below.  Labs Reviewed  BASIC METABOLIC PANEL - Abnormal; Notable for the following components:      Result Value   CO2 21 (*)    Glucose, Bld 140 (*)    Creatinine, Ser 1.58 (*)    Calcium 8.8 (*)    GFR calc non Af Amer 37 (*)    GFR calc Af Amer 43 (*)    All other components within normal limits  CBC - Abnormal; Notable for the following components:   WBC 12.5 (*)    All other components within normal limits  TROPONIN I    ____________________________________________    EKG I, Lisa Roca, MD, the attending physician have personally viewed and interpreted all ECGs.  81 bpm.  Normal sinus rhythm.  Narrow QS per normal axis.  Normal ST and T wave ____________________________________________  RADIOLOGY   Chest x-ray 1 view: There is no active cardia pulmonary disease. __________________________________________  PROCEDURES  Procedure(s) performed: None  Procedures  Critical Care performed: None   ____________________________________________  ED COURSE / ASSESSMENT AND PLAN  Pertinent labs &  imaging results that were available during my care of the patient were reviewed by me and considered in my medical decision making (see chart for details).     Patient has chronic recurrent chest pain for the past several months, and has known coronary artery disease that has been treated medically.  EKG is reassuring.  Laboratory studies are reassuring.  Troponin is negative.  I spoke with Dr. [name], he states that Dr. Fletcher Anon has been discussing whether or not there is a lesion that he is amenable at all to any additional intervention, but at this point it has been felt way too dangerous.  He will place a note for Dr. Fletcher Anon and I have discussed with the patient to make a follow-up appoint with Dr. Fletcher Anon.    CONSULTATIONS:   I spoke with Dr. [name] by phone, patient's cardiologist, he knows her well and discussed her chronic disease.  Recommends that she could restart the Ranexa.  Okay for discharge home.  He has sent a note to Dr. Fletcher Anon   Patient / Family / Caregiver informed of clinical course, medical decision-making process, and agree with plan.   I discussed return  precautions, follow-up instructions, and discharge instructions with patient and/or family.  Discharge Instructions : Return to the emergency department immediately for any worsening condition including uncontrolled chest pain, any association with nausea, sweats, dizziness or passing out, trouble breathing, or any other symptoms concerning to you.    ___________________________________________   FINAL CLINICAL IMPRESSION(S) / ED DIAGNOSES   Final diagnoses:  Nonspecific chest pain      ___________________________________________         Note: This dictation was prepared with Dragon dictation. Any transcriptional errors that result from this process are unintentional    Lisa Roca, MD 12/17/17 1512

## 2017-12-19 ENCOUNTER — Observation Stay
Admission: EM | Admit: 2017-12-19 | Discharge: 2017-12-21 | Disposition: A | Discharge status: Home / Self Care | Payer: Medicare Other | Attending: Internal Medicine | Admitting: Internal Medicine

## 2017-12-19 ENCOUNTER — Emergency Department: Payer: Medicare Other

## 2017-12-19 ENCOUNTER — Encounter: Payer: Self-pay | Admitting: Emergency Medicine

## 2017-12-19 ENCOUNTER — Other Ambulatory Visit: Payer: Self-pay

## 2017-12-19 DIAGNOSIS — Z9049 Acquired absence of other specified parts of digestive tract: Secondary | ICD-10-CM | POA: Diagnosis not present

## 2017-12-19 DIAGNOSIS — K219 Gastro-esophageal reflux disease without esophagitis: Secondary | ICD-10-CM | POA: Diagnosis not present

## 2017-12-19 DIAGNOSIS — N183 Chronic kidney disease, stage 3 (moderate): Secondary | ICD-10-CM | POA: Diagnosis not present

## 2017-12-19 DIAGNOSIS — Z8711 Personal history of peptic ulcer disease: Secondary | ICD-10-CM | POA: Insufficient documentation

## 2017-12-19 DIAGNOSIS — E1122 Type 2 diabetes mellitus with diabetic chronic kidney disease: Secondary | ICD-10-CM | POA: Diagnosis not present

## 2017-12-19 DIAGNOSIS — Z7902 Long term (current) use of antithrombotics/antiplatelets: Secondary | ICD-10-CM | POA: Diagnosis not present

## 2017-12-19 DIAGNOSIS — Z7982 Long term (current) use of aspirin: Secondary | ICD-10-CM | POA: Insufficient documentation

## 2017-12-19 DIAGNOSIS — I2510 Atherosclerotic heart disease of native coronary artery without angina pectoris: Secondary | ICD-10-CM | POA: Diagnosis not present

## 2017-12-19 DIAGNOSIS — Z79890 Hormone replacement therapy (postmenopausal): Secondary | ICD-10-CM | POA: Insufficient documentation

## 2017-12-19 DIAGNOSIS — I25118 Atherosclerotic heart disease of native coronary artery with other forms of angina pectoris: Principal | ICD-10-CM | POA: Insufficient documentation

## 2017-12-19 DIAGNOSIS — Z85820 Personal history of malignant melanoma of skin: Secondary | ICD-10-CM | POA: Diagnosis not present

## 2017-12-19 DIAGNOSIS — F419 Anxiety disorder, unspecified: Secondary | ICD-10-CM | POA: Diagnosis not present

## 2017-12-19 DIAGNOSIS — Z8249 Family history of ischemic heart disease and other diseases of the circulatory system: Secondary | ICD-10-CM | POA: Insufficient documentation

## 2017-12-19 DIAGNOSIS — Z888 Allergy status to other drugs, medicaments and biological substances status: Secondary | ICD-10-CM | POA: Diagnosis not present

## 2017-12-19 DIAGNOSIS — Z818 Family history of other mental and behavioral disorders: Secondary | ICD-10-CM | POA: Insufficient documentation

## 2017-12-19 DIAGNOSIS — E039 Hypothyroidism, unspecified: Secondary | ICD-10-CM | POA: Diagnosis not present

## 2017-12-19 DIAGNOSIS — F319 Bipolar disorder, unspecified: Secondary | ICD-10-CM | POA: Diagnosis not present

## 2017-12-19 DIAGNOSIS — I2089 Other forms of angina pectoris: Secondary | ICD-10-CM

## 2017-12-19 DIAGNOSIS — Z79899 Other long term (current) drug therapy: Secondary | ICD-10-CM | POA: Insufficient documentation

## 2017-12-19 DIAGNOSIS — I200 Unstable angina: Secondary | ICD-10-CM | POA: Diagnosis not present

## 2017-12-19 DIAGNOSIS — I129 Hypertensive chronic kidney disease with stage 1 through stage 4 chronic kidney disease, or unspecified chronic kidney disease: Secondary | ICD-10-CM | POA: Insufficient documentation

## 2017-12-19 DIAGNOSIS — Z90721 Acquired absence of ovaries, unilateral: Secondary | ICD-10-CM | POA: Diagnosis not present

## 2017-12-19 DIAGNOSIS — Z9889 Other specified postprocedural states: Secondary | ICD-10-CM | POA: Insufficient documentation

## 2017-12-19 DIAGNOSIS — Z8673 Personal history of transient ischemic attack (TIA), and cerebral infarction without residual deficits: Secondary | ICD-10-CM | POA: Insufficient documentation

## 2017-12-19 DIAGNOSIS — Z823 Family history of stroke: Secondary | ICD-10-CM | POA: Insufficient documentation

## 2017-12-19 DIAGNOSIS — Z9884 Bariatric surgery status: Secondary | ICD-10-CM | POA: Diagnosis not present

## 2017-12-19 DIAGNOSIS — I209 Angina pectoris, unspecified: Secondary | ICD-10-CM | POA: Diagnosis not present

## 2017-12-19 DIAGNOSIS — Z808 Family history of malignant neoplasm of other organs or systems: Secondary | ICD-10-CM | POA: Insufficient documentation

## 2017-12-19 DIAGNOSIS — Z87891 Personal history of nicotine dependence: Secondary | ICD-10-CM | POA: Diagnosis not present

## 2017-12-19 DIAGNOSIS — D509 Iron deficiency anemia, unspecified: Secondary | ICD-10-CM | POA: Insufficient documentation

## 2017-12-19 DIAGNOSIS — G35 Multiple sclerosis: Secondary | ICD-10-CM | POA: Insufficient documentation

## 2017-12-19 DIAGNOSIS — E785 Hyperlipidemia, unspecified: Secondary | ICD-10-CM | POA: Insufficient documentation

## 2017-12-19 DIAGNOSIS — I10 Essential (primary) hypertension: Secondary | ICD-10-CM | POA: Diagnosis not present

## 2017-12-19 DIAGNOSIS — Z955 Presence of coronary angioplasty implant and graft: Secondary | ICD-10-CM | POA: Insufficient documentation

## 2017-12-19 DIAGNOSIS — Z8041 Family history of malignant neoplasm of ovary: Secondary | ICD-10-CM | POA: Insufficient documentation

## 2017-12-19 DIAGNOSIS — Z8679 Personal history of other diseases of the circulatory system: Secondary | ICD-10-CM | POA: Insufficient documentation

## 2017-12-19 DIAGNOSIS — I208 Other forms of angina pectoris: Secondary | ICD-10-CM

## 2017-12-19 DIAGNOSIS — Z86010 Personal history of colonic polyps: Secondary | ICD-10-CM | POA: Insufficient documentation

## 2017-12-19 DIAGNOSIS — Z66 Do not resuscitate: Secondary | ICD-10-CM | POA: Insufficient documentation

## 2017-12-19 DIAGNOSIS — Z841 Family history of disorders of kidney and ureter: Secondary | ICD-10-CM | POA: Insufficient documentation

## 2017-12-19 DIAGNOSIS — Z800 Family history of malignant neoplasm of digestive organs: Secondary | ICD-10-CM | POA: Insufficient documentation

## 2017-12-19 DIAGNOSIS — R079 Chest pain, unspecified: Secondary | ICD-10-CM | POA: Diagnosis not present

## 2017-12-19 DIAGNOSIS — Z803 Family history of malignant neoplasm of breast: Secondary | ICD-10-CM | POA: Insufficient documentation

## 2017-12-19 LAB — CBC
HEMATOCRIT: 38.2 % (ref 36.0–46.0)
Hemoglobin: 12.2 g/dL (ref 12.0–15.0)
MCH: 26.3 pg (ref 26.0–34.0)
MCHC: 31.9 g/dL (ref 30.0–36.0)
MCV: 82.3 fL (ref 80.0–100.0)
NRBC: 0 % (ref 0.0–0.2)
PLATELETS: 148 10*3/uL — AB (ref 150–400)
RBC: 4.64 MIL/uL (ref 3.87–5.11)
RDW: 14.2 % (ref 11.5–15.5)
WBC: 6.7 10*3/uL (ref 4.0–10.5)

## 2017-12-19 LAB — GLUCOSE, CAPILLARY
GLUCOSE-CAPILLARY: 153 mg/dL — AB (ref 70–99)
GLUCOSE-CAPILLARY: 131 mg/dL — AB (ref 70–99)

## 2017-12-19 LAB — APTT: aPTT: 31 seconds (ref 24–36)

## 2017-12-19 LAB — BASIC METABOLIC PANEL
Anion gap: 7 (ref 5–15)
BUN: 19 mg/dL (ref 6–20)
CALCIUM: 8.5 mg/dL — AB (ref 8.9–10.3)
CHLORIDE: 107 mmol/L (ref 98–111)
CO2: 22 mmol/L (ref 22–32)
CREATININE: 1.75 mg/dL — AB (ref 0.44–1.00)
GFR calc non Af Amer: 33 mL/min — ABNORMAL LOW (ref >60)
GFR, EST AFRICAN AMERICAN: 38 mL/min — AB (ref >60)
Glucose, Bld: 320 mg/dL — ABNORMAL HIGH (ref 70–99)
Potassium: 3.8 mmol/L (ref 3.5–5.1)
SODIUM: 136 mmol/L (ref 135–145)

## 2017-12-19 LAB — HEPARIN LEVEL (UNFRACTIONATED): HEPARIN UNFRACTIONATED: 0.3 [IU]/mL (ref 0.30–0.70)

## 2017-12-19 LAB — TROPONIN I
Troponin I: <0.03 ng/mL (ref <0.03)
Troponin I: <0.03 ng/mL (ref <0.03)
Troponin I: <0.03 ng/mL (ref <0.03)

## 2017-12-19 LAB — PROTIME-INR
INR: 0.96
Prothrombin Time: 12.7 seconds (ref 11.4–15.2)

## 2017-12-19 MED ORDER — CALCIUM CARBONATE-VITAMIN D 500-200 MG-UNIT PO TABS
1.0000 | ORAL_TABLET | Freq: Two times a day (BID) | ORAL | Status: DC
  Administered 2017-12-19 – 2017-12-21 (×4): 1 via ORAL
  Filled 2017-12-19 (×4): qty 1

## 2017-12-19 MED ORDER — CITALOPRAM HYDROBROMIDE 20 MG PO TABS
40.0000 mg | ORAL_TABLET | Freq: Every day | ORAL | Status: DC
  Administered 2017-12-20 – 2017-12-21 (×2): 40 mg via ORAL
  Filled 2017-12-19 (×2): qty 2

## 2017-12-19 MED ORDER — ACETAMINOPHEN 650 MG RE SUPP: 650.0000 mg | Freq: Four times a day (QID) | RECTAL | Status: DC | PRN

## 2017-12-19 MED ORDER — VITAMIN B-12 1000 MCG PO TABS
1000.0000 ug | ORAL_TABLET | Freq: Every day | ORAL | Status: DC
  Administered 2017-12-20 – 2017-12-21 (×2): 1000 ug via ORAL
  Filled 2017-12-19 (×2): qty 1

## 2017-12-19 MED ORDER — ONDANSETRON HCL 4 MG/2ML IJ SOLN
4.0000 mg | Freq: Once | INTRAMUSCULAR | Status: AC
  Administered 2017-12-19: 4 mg via INTRAVENOUS
  Filled 2017-12-19: qty 2

## 2017-12-19 MED ORDER — OXYCODONE HCL 5 MG PO TABS
5.0000 mg | ORAL_TABLET | ORAL | Status: DC | PRN
  Administered 2017-12-19 – 2017-12-20 (×3): 5 mg via ORAL
  Filled 2017-12-19 (×3): qty 1

## 2017-12-19 MED ORDER — SODIUM CHLORIDE 0.9% FLUSH
3.0000 mL | INTRAVENOUS | Status: DC | PRN
  Administered 2017-12-19: 3 mL via INTRAVENOUS
  Filled 2017-12-19: qty 3

## 2017-12-19 MED ORDER — PANCRELIPASE (LIP-PROT-AMYL) 12000-38000 UNITS PO CPEP
36000.0000 [IU] | ORAL_CAPSULE | ORAL | Status: DC | PRN
  Filled 2017-12-19: qty 3

## 2017-12-19 MED ORDER — INSULIN ASPART 100 UNIT/ML ~~LOC~~ SOLN: 0.0000 [IU] | Freq: Every day | SUBCUTANEOUS | Status: DC

## 2017-12-19 MED ORDER — MORPHINE SULFATE (PF) 2 MG/ML IV SOLN
2.0000 mg | INTRAVENOUS | Status: DC | PRN
  Administered 2017-12-19 (×2): 2 mg via INTRAVENOUS
  Filled 2017-12-19 (×2): qty 1

## 2017-12-19 MED ORDER — HYDRALAZINE HCL 20 MG/ML IJ SOLN
10.0000 mg | Freq: Four times a day (QID) | INTRAMUSCULAR | Status: DC | PRN
  Administered 2017-12-19: 10 mg via INTRAVENOUS
  Filled 2017-12-19: qty 1

## 2017-12-19 MED ORDER — PYRIDOSTIGMINE BROMIDE 60 MG PO TABS
60.0000 mg | ORAL_TABLET | Freq: Three times a day (TID) | ORAL | Status: DC
  Administered 2017-12-19 – 2017-12-21 (×6): 60 mg via ORAL
  Filled 2017-12-19 (×8): qty 1

## 2017-12-19 MED ORDER — SODIUM CHLORIDE 0.9 % IV SOLN
INTRAVENOUS | Status: DC
  Administered 2017-12-20: 07:00:00 via INTRAVENOUS

## 2017-12-19 MED ORDER — SODIUM CHLORIDE 0.9% FLUSH: 3.0000 mL | Freq: Two times a day (BID) | INTRAVENOUS | Status: DC

## 2017-12-19 MED ORDER — NITROGLYCERIN 0.4 MG SL SUBL: 0.4000 mg | SUBLINGUAL_TABLET | SUBLINGUAL | Status: DC | PRN

## 2017-12-19 MED ORDER — LOSARTAN POTASSIUM 25 MG PO TABS
25.0000 mg | ORAL_TABLET | Freq: Every day | ORAL | Status: DC
  Administered 2017-12-20 – 2017-12-21 (×2): 25 mg via ORAL
  Filled 2017-12-19 (×2): qty 1

## 2017-12-19 MED ORDER — SODIUM CHLORIDE 0.9 % IV SOLN: 250.0000 mL | INTRAVENOUS | Status: DC | PRN

## 2017-12-19 MED ORDER — HEPARIN BOLUS VIA INFUSION
4000.0000 [IU] | Freq: Once | INTRAVENOUS | Status: AC
  Administered 2017-12-19: 4000 [IU] via INTRAVENOUS
  Filled 2017-12-19: qty 4000

## 2017-12-19 MED ORDER — HEPARIN (PORCINE) 25000 UT/250ML-% IV SOLN
1000.0000 [IU]/h | INTRAVENOUS | Status: DC
  Administered 2017-12-19: 950 [IU]/h via INTRAVENOUS
  Filled 2017-12-19 (×2): qty 250

## 2017-12-19 MED ORDER — ADULT MULTIVITAMIN W/MINERALS CH
1.0000 | ORAL_TABLET | Freq: Every day | ORAL | Status: DC
  Administered 2017-12-20 – 2017-12-21 (×2): 1 via ORAL
  Filled 2017-12-19 (×2): qty 1

## 2017-12-19 MED ORDER — PANCRELIPASE (LIP-PROT-AMYL) 12000-38000 UNITS PO CPEP
72000.0000 [IU] | ORAL_CAPSULE | Freq: Three times a day (TID) | ORAL | Status: DC
  Filled 2017-12-19 (×7): qty 6

## 2017-12-19 MED ORDER — LEVOTHYROXINE SODIUM 25 MCG PO TABS
25.0000 ug | ORAL_TABLET | Freq: Every day | ORAL | Status: DC
  Administered 2017-12-20 – 2017-12-21 (×2): 25 ug via ORAL
  Filled 2017-12-19 (×2): qty 1

## 2017-12-19 MED ORDER — ISOSORBIDE MONONITRATE ER 60 MG PO TB24
120.0000 mg | ORAL_TABLET | Freq: Every day | ORAL | Status: DC
  Administered 2017-12-20 – 2017-12-21 (×2): 120 mg via ORAL
  Filled 2017-12-19 (×2): qty 2

## 2017-12-19 MED ORDER — AMLODIPINE BESYLATE 5 MG PO TABS
5.0000 mg | ORAL_TABLET | Freq: Every day | ORAL | Status: DC
  Administered 2017-12-20 – 2017-12-21 (×2): 5 mg via ORAL
  Filled 2017-12-19 (×2): qty 1

## 2017-12-19 MED ORDER — PANCRELIPASE (LIP-PROT-AMYL) 12000-38000 UNITS PO CPEP: 36000.0000 [IU] | ORAL_CAPSULE | ORAL | Status: DC

## 2017-12-19 MED ORDER — ALPRAZOLAM 1 MG PO TABS
1.0000 mg | ORAL_TABLET | Freq: Every day | ORAL | Status: DC | PRN
  Administered 2017-12-20: 1 mg via ORAL
  Filled 2017-12-19: qty 1

## 2017-12-19 MED ORDER — ROSUVASTATIN CALCIUM 10 MG PO TABS
10.0000 mg | ORAL_TABLET | Freq: Every day | ORAL | Status: DC
  Administered 2017-12-19 – 2017-12-20 (×2): 10 mg via ORAL
  Filled 2017-12-19 (×2): qty 1

## 2017-12-19 MED ORDER — ONDANSETRON HCL 4 MG/2ML IJ SOLN
4.0000 mg | Freq: Four times a day (QID) | INTRAMUSCULAR | Status: DC | PRN
  Administered 2017-12-19 – 2017-12-20 (×3): 4 mg via INTRAVENOUS
  Filled 2017-12-19 (×3): qty 2

## 2017-12-19 MED ORDER — CLOPIDOGREL BISULFATE 75 MG PO TABS
75.0000 mg | ORAL_TABLET | Freq: Every day | ORAL | Status: DC
  Administered 2017-12-20 – 2017-12-21 (×2): 75 mg via ORAL
  Filled 2017-12-19 (×2): qty 1

## 2017-12-19 MED ORDER — ASPIRIN 81 MG PO CHEW
81.0000 mg | CHEWABLE_TABLET | Freq: Every day | ORAL | Status: DC
  Administered 2017-12-20 – 2017-12-21 (×2): 81 mg via ORAL
  Filled 2017-12-19 (×2): qty 1

## 2017-12-19 MED ORDER — ACETAMINOPHEN 325 MG PO TABS: 650.0000 mg | ORAL_TABLET | Freq: Four times a day (QID) | ORAL | Status: DC | PRN

## 2017-12-19 MED ORDER — METOPROLOL SUCCINATE ER 50 MG PO TB24
50.0000 mg | ORAL_TABLET | Freq: Every day | ORAL | Status: DC
  Filled 2017-12-19: qty 1

## 2017-12-19 MED ORDER — GABAPENTIN 300 MG PO CAPS
300.0000 mg | ORAL_CAPSULE | Freq: Two times a day (BID) | ORAL | Status: DC
  Administered 2017-12-19 – 2017-12-21 (×4): 300 mg via ORAL
  Filled 2017-12-19 (×4): qty 1

## 2017-12-19 MED ORDER — ONDANSETRON HCL 4 MG PO TABS: 4.0000 mg | ORAL_TABLET | Freq: Four times a day (QID) | ORAL | Status: DC | PRN

## 2017-12-19 MED ORDER — INSULIN ASPART 100 UNIT/ML ~~LOC~~ SOLN
0.0000 [IU] | Freq: Three times a day (TID) | SUBCUTANEOUS | Status: DC
  Administered 2017-12-19: 2 [IU] via SUBCUTANEOUS
  Administered 2017-12-20: 5 [IU] via SUBCUTANEOUS
  Filled 2017-12-19 (×2): qty 1

## 2017-12-19 MED ORDER — BUPROPION HCL ER (XL) 150 MG PO TB24
150.0000 mg | ORAL_TABLET | Freq: Every day | ORAL | Status: DC
  Administered 2017-12-20 – 2017-12-21 (×2): 150 mg via ORAL
  Filled 2017-12-19 (×2): qty 1

## 2017-12-19 MED ORDER — MORPHINE SULFATE (PF) 4 MG/ML IV SOLN
4.0000 mg | Freq: Once | INTRAVENOUS | Status: AC
  Administered 2017-12-19: 4 mg via INTRAVENOUS
  Filled 2017-12-19: qty 1

## 2017-12-19 MED ORDER — PANTOPRAZOLE SODIUM 40 MG PO TBEC
40.0000 mg | DELAYED_RELEASE_TABLET | Freq: Two times a day (BID) | ORAL | Status: DC
  Administered 2017-12-19 – 2017-12-21 (×4): 40 mg via ORAL
  Filled 2017-12-19 (×4): qty 1

## 2017-12-19 NOTE — ED Triage Notes (Signed)
Pt arrived via POV with reports of chest pain since last Sunday, pt has cardiac hx and was recently seen in the ED on 11/15 for chest pain.  Pt has taken 3 NTG this morning. Pt states the chest pain is in the center and radiates to the left arm. Pt states the NTG eases the pain temporarily, but then comes back.  Pt reports shortness of breath with the chest pain.

## 2017-12-19 NOTE — H&P (Signed)
[facility] NAME: [name]    MR#:  [id number]  DATE OF BIRTH:  [birth year]  DATE OF ADMISSION:  12/19/2017  PRIMARY CARE PHYSICIAN: [name], MD   REQUESTING/REFERRING PHYSICIAN: [name]  CHIEF COMPLAINT:   Chief Complaint  Patient presents with  . Chest Pain    HISTORY OF PRESENT ILLNESS:  [name]  is a 47 y.o. female with a known history of CAD status post PCI, CKD stage III, history of stroke with minimal left-sided weakness, diabetes, hypertension, GERD, anemia of chronic disease presents to hospital secondary to worsening chest pain. Patient had a last cardiac catheterization in August 2019 and had angioplasty without stent placement at the time.  She has a diagonal branch that is occluded as she is being managed medically.  Last couple of weeks she is been having on and off chest pains.  Was here last month with chest pain and was discharged as troponins were negative.  Pain has been getting much worse in the last couple of days.  It sharp persistent mostly radiating down her left shoulder.  Has seen her cardiologist 3 weeks ago with similar complaints and also was in the ER 2 days ago with similar complaints and was discharged home as troponins were negative.  Last night she got diaphoretic with the pain and was very nauseous and short of breath the pain was persistent and so presented to the emergency room.  She was already taking nitroglycerin sublingual at home with minimal relief.  First troponin is negative.  She is being admitted for persistent unstable angina.  PAST MEDICAL HISTORY:   Past Medical History:  Diagnosis Date  . Anemia    iron deficiency anemia  . Aortic arch aneurysm ([place])   . Bipolar disorder ([place])   . BRCA negative 2014  . CAD (coronary artery disease)    a. 08/2003 Cath: LAD 30-40-med Rx; b. 11/2014 PCI: LAD 10m(3.25x23 Xience Alpine DES); c. 06/2015 PCI: D1  (2.25x12 Resolute Integrity DES); d. 06/2017 PCI: Patent mLAD stent, D2 95 (PTCA); e. 09/2017 Cath/PCI: LM nl, LAD 385m70d, D1 40, D2 99ost (CBA), LCX 40p, RCA 40ost/p.  . Marland KitchenKD (chronic kidney disease), stage III (HCLadson  . Colon polyp   . CVA (cerebral vascular accident) (HCWatson   Left side weakness.   . Diabetes (HCF[facility]  . Family history of breast cancer    BRCA neg 2014  . Gastric ulcer 04/27/2011  . History of echocardiogram    a. 03/2017 Echo: EF 60-65%, no rwma.  . Marland KitchenTN (hypertension)   . Hyperlipemia   . Hypothyroid   . Malignant melanoma of skin of scalp (HCSt. Helena  . MI, acute, non ST segment elevation (HCJasonville  . Neuromuscular disorder (HCLima  . S/P drug eluting coronary stent placement 06/04/2015  . Sepsis (HCPromised Land2/14/2019    PAST SURGICAL HISTORY:   Past Surgical History:  Procedure Laterality Date  . APPENDECTOMY    . CARDIAC CATHETERIZATION N/A 11/09/2014   Procedure: Coronary Angiography;  Surgeon: TiMinna MerrittsMD;  [facility];  Service: Cardiovascular;  Laterality: N/A;  . CARDIAC CATHETERIZATION N/A 11/12/2014   Procedure: Coronary Stent Intervention;  Surgeon: AlIsaias CowmanMD;  [facility];  Service: Cardiovascular;  Laterality: N/A;  . CARDIAC CATHETERIZATION N/A 04/18/2015   Procedure: Left Heart Cath and Coronary Angiography;  Surgeon: TiKathlene November  [name], MD;  Location: [place] [facility] LAB;  Service: Cardiovascular;  Laterality: N/A;  . CARDIAC CATHETERIZATION Left 06/04/2015   Procedure: Left Heart Cath and Coronary Angiography;  Surgeon: Wellington Hampshire, MD;  Location: [place] CV LAB;  Service: Cardiovascular;  Laterality: Left;  . CARDIAC CATHETERIZATION N/A 06/04/2015   Procedure: Coronary Stent Intervention;  Surgeon: Wellington Hampshire, MD;  Location: [place] CV LAB;  Service: Cardiovascular;  Laterality: N/A;  . CESAREAN SECTION  2001  . CHOLECYSTECTOMY N/A 11/18/2016   Procedure: LAPAROSCOPIC CHOLECYSTECTOMY  WITH INTRAOPERATIVE CHOLANGIOGRAM;  Surgeon: Christene Lye, MD;  Location: [facility];  Service: General;  Laterality: N/A;  . COLONOSCOPY WITH PROPOFOL N/A 04/27/2016   Procedure: COLONOSCOPY WITH PROPOFOL;  Surgeon: [name], MD;  Location: [place];  Service: Endoscopy;  Laterality: N/A;  . CORONARY ANGIOPLASTY    . CORONARY BALLOON ANGIOPLASTY N/A 06/29/2017   Procedure: CORONARY BALLOON ANGIOPLASTY;  Surgeon: Wellington Hampshire, MD;  Location: [place] CV LAB;  Service: Cardiovascular;  Laterality: N/A;  . CORONARY BALLOON ANGIOPLASTY N/A 09/20/2017   Procedure: CORONARY BALLOON ANGIOPLASTY;  Surgeon: Wellington Hampshire, MD;  Location: [place] [facility] LAB;  Service: Cardiovascular;  Laterality: N/A;  . DILATION AND CURETTAGE OF UTERUS    . ESOPHAGOGASTRODUODENOSCOPY (EGD) WITH PROPOFOL N/A 09/14/2014   Procedure: ESOPHAGOGASTRODUODENOSCOPY (EGD) WITH PROPOFOL;  Surgeon: Josefine Class, MD;  [facility];  Service: Endoscopy;  Laterality: N/A;  . ESOPHAGOGASTRODUODENOSCOPY (EGD) WITH PROPOFOL N/A 04/27/2016   Procedure: ESOPHAGOGASTRODUODENOSCOPY (EGD) WITH PROPOFOL;  Surgeon: [name], MD;  Location: [place];  Service: Endoscopy;  Laterality: N/A;  Diabetic - oral meds  . GASTRIC BYPASS  09/2009   [facility]   . Left Carotid to sublcavian artery bypass w/ subclavian artery ligation     a. Performed @ [place].  . LEFT HEART CATH AND CORONARY ANGIOGRAPHY Left 06/29/2017   Procedure: LEFT HEART CATH AND CORONARY ANGIOGRAPHY;  Surgeon: Wellington Hampshire, MD;  Location: [place] CV LAB;  Service: Cardiovascular;  Laterality: Left;  . LEFT HEART CATH AND CORONARY ANGIOGRAPHY N/A 09/20/2017   Procedure: LEFT HEART CATH AND CORONARY ANGIOGRAPHY;  Surgeon: Wellington Hampshire, MD;  Location: [place] CV LAB;  Service: Cardiovascular;  Laterality: N/A;  . MELANOMA EXCISION  2016   Dr. Evorn Gong  . [place]  2002  .  RIGHT OOPHORECTOMY    . SHOULDER ARTHROSCOPY WITH OPEN ROTATOR CUFF REPAIR Right 01/07/2016   Procedure: SHOULDER ARTHROSCOPY WITH DEBRIDMENT, SUBACHROMIAL DECOMPRESSION;  Surgeon: Corky Mull, MD;  Location: [facility] ORS;  Service: Orthopedics;  Laterality: Right;  . SHOULDER ARTHROSCOPY WITH OPEN ROTATOR CUFF REPAIR Right 03/16/2017   Procedure: SHOULDER ARTHROSCOPY WITH OPEN ROTATOR CUFF REPAIR POSSIBLE BICEPS TENODESIS;  Surgeon: Corky Mull, MD;  Location: [facility];  Service: Orthopedics;  Laterality: Right;  . TRIGGER FINGER RELEASE Right     [place]    SOCIAL HISTORY:   Social History   Tobacco Use  . Smoking status: Former Smoker    Types: Cigarettes    Last attempt to quit: 08/31/1994    Years since quitting: 23.3  . Smokeless tobacco: Never Used  . Tobacco comment: quit 28 years ago  Substance Use Topics  . Alcohol use: No    Alcohol/week: 0.0 standard drinks    FAMILY HISTORY:   Family History  Problem Relation Age of Onset  . Hypertension Mother   . Anxiety disorder Mother   .  Depression Mother   . Bipolar disorder Mother   . Heart disease Mother        No details  . Hyperlipidemia Mother   . Kidney disease Father   . Heart disease Father 58  . Hypertension Father   . Diabetes Father   . Stroke Father   . Colon cancer Father        dx in his 59's  . Anxiety disorder Father   . Depression Father   . Skin cancer Father   . Kidney disease Sister   . Thyroid nodules Sister   . Hypertension Sister   . Hypertension Sister   . Diabetes Sister   . Hyperlipidemia Sister   . Depression Sister   . Breast cancer Maternal Aunt 35  . Breast cancer Maternal Aunt 43  . Ovarian cancer Cousin   . Colon cancer Cousin   . Breast cancer Other   . Kidney cancer Neg Hx   . Bladder Cancer Neg Hx     DRUG ALLERGIES:   Allergies  Allergen Reactions  . Lipitor [Atorvastatin] Other (See Comments)    Leg pains  . Tramadol Other (See Comments)    Mouth feels like  it's on fire    REVIEW OF SYSTEMS:   Review of Systems  Constitutional: Positive for diaphoresis. Negative for chills, fever, malaise/fatigue and weight loss.  HENT: Negative for ear discharge, ear pain, hearing loss and nosebleeds.   Eyes: Negative for blurred vision, double vision and photophobia.  Respiratory: Positive for shortness of breath. Negative for cough, hemoptysis and wheezing.   Cardiovascular: Positive for chest pain. Negative for palpitations, orthopnea and leg swelling.  Gastrointestinal: Positive for nausea. Negative for abdominal pain, constipation, diarrhea, heartburn, melena and vomiting.  Genitourinary: Negative for dysuria, frequency, hematuria and urgency.  Musculoskeletal: Negative for back pain, myalgias and neck pain.  Skin: Negative for rash.  Neurological: Negative for dizziness, tingling, sensory change, speech change, focal weakness and headaches.  Endo/Heme/Allergies: Does not bruise/bleed easily.  Psychiatric/Behavioral: Negative for depression.    MEDICATIONS AT HOME:   Prior to Admission medications   Medication Sig Start Date End Date Taking? Authorizing Provider  amLODipine (NORVASC) 5 MG tablet Take 1 tablet (5 mg total) by mouth daily. 11/29/17  Yes Minna Merritts, MD  aspirin 81 MG chewable tablet Chew 1 tablet (81 mg total) by mouth daily. Patient taking differently: Chew 324 mg by mouth daily.  07/01/17  Yes Dunn, Areta Haber, PA-C  Blood Glucose Monitoring Suppl (ONE TOUCH ULTRA 2) w/Device KIT Use to check blood sugar once a day. Dx. E11.9 03/18/17  Yes [name], MD  buPROPion (WELLBUTRIN XL) 150 MG 24 hr tablet TAKE 1 TABLET BY MOUTH DAILY 08/10/17  Yes [name], MD  calcium-vitamin D (OSCAL 500/200 D-3) 500-200 MG-UNIT tablet Take 1 tablet by mouth 2 (two) times daily. 11/04/17 11/04/18 Yes Cuthriell, Charline Bills, PA-C  citalopram (CELEXA) 40 MG tablet Take 1 tablet (40 mg total) by mouth daily. 06/08/17  Yes [name], MD    clopidogrel (PLAVIX) 75 MG tablet Take 1 tablet (75 mg total) by mouth daily with breakfast. 07/01/17  Yes Dunn, Areta Haber, PA-C  Cyanocobalamin (VITAMIN B 12 PO) Take 1,000 mcg by mouth daily.    Yes [provider]  gabapentin (NEURONTIN) 300 MG capsule TAKE 1 CAPSULE BY MOUTH TWICE A DAY Patient taking differently: Take 300 mg by mouth 2 (two) times daily.  10/30/17  Yes [name],  MD  isosorbide mononitrate (IMDUR) 120 MG 24 hr tablet Take 1 tablet (120 mg total) by mouth daily. 10/13/17  Yes Dunn, Areta Haber, PA-C  levothyroxine (SYNTHROID, LEVOTHROID) 25 MCG tablet Take 1 tablet (25 mcg total) daily before breakfast by mouth. 12/18/16  Yes [name], MD  lipase/protease/amylase (CREON) 36000 UNITS CPEP capsule Take 36,000-72,000 Units by mouth See admin instructions. Take 2 capsules (72,000u) three times daily with first bite of meals and 1 capsule (36,000u) daily with first bite of snacks 11/24/17  Yes [provider]  losartan (COZAAR) 25 MG tablet Take 1/2 tablet (12.5 mg) by mouth once daily Patient taking differently: Take 25 mg by mouth daily. Take 1/2 tablet (12.5 mg) by mouth once daily 10/13/17  Yes Dunn, Areta Haber, PA-C  metoprolol succinate (TOPROL-XL) 50 MG 24 hr tablet Take 1 tablet (50 mg total) by mouth daily. Take with or immediately following a meal. 07/01/17  Yes Dunn, Areta Haber, PA-C  Multiple Vitamin (MULTIVITAMIN WITH MINERALS) TABS tablet Take 1 tablet by mouth daily. 04/14/17  Yes Gouru, Illene Silver, MD  nitroGLYCERIN (NITROSTAT) 0.4 MG SL tablet Place 1 tablet (0.4 mg total) under the tongue every 5 (five) minutes as needed for chest pain. 06/02/17  Yes Gollan, Kathlene November, MD  pantoprazole (PROTONIX) 40 MG tablet Take 1 tablet (40 mg total) by mouth 2 (two) times daily. 05/11/16  Yes [name], MD  promethazine (PHENERGAN) 25 MG tablet Take 1 tablet (25 mg total) by mouth every 6 (six) hours as needed for nausea or vomiting. 10/20/17  Yes [name], MD   pyridostigmine (MESTINON) 60 MG tablet Take 60 mg by mouth 3 (three) times daily.  07/26/17  Yes [provider]  rosuvastatin (CRESTOR) 10 MG tablet Take 1 tablet (10 mg total) by mouth daily. 08/10/17  Yes Gollan, Kathlene November, MD  ALPRAZolam (XANAX) 1 MG tablet TAKE 1 TABLET (1 MG TOTAL) BY MOUTH DAILY AS NEEDED FOR ANXIETY. 12/14/17   [name], MD  hyoscyamine (LEVSIN SL) 0.125 MG SL tablet Place 1 tablet (0.125 mg total) under the tongue every 6 (six) hours as needed. Patient taking differently: Place 0.125 mg under the tongue every 6 (six) hours as needed for cramping.  02/01/17   [name], MD  ranolazine (RANEXA) 1000 MG SR tablet 1/2 tab twice per day for 1 week and then increase to 1 tablet twice per day Patient not taking: Reported on 12/19/2017 12/17/17   Lisa Roca, MD      VITAL SIGNS:  Blood pressure (!) 190/93, pulse 76, temperature 98.3 F (36.8 C), temperature source Oral, resp. rate 20, height '5\' 2"'  (1.575 m), weight 78 kg, SpO2 98 %.  PHYSICAL EXAMINATION:   Physical Exam  GENERAL:  47 y.o.-year-old patient lying in the bed with no acute distress.  EYES: Pupils equal, round, reactive to light and accommodation. No scleral icterus. Extraocular muscles intact.  HEENT: Head atraumatic, normocephalic. Oropharynx and nasopharynx clear.  NECK:  Supple, no jugular venous distention. No thyroid enlargement, no tenderness.  LUNGS: Normal breath sounds bilaterally, no wheezing, rales,rhonchi or crepitation. No use of accessory muscles of respiration.  CARDIOVASCULAR: S1, S2 normal. No murmurs, rubs, or gallops. No chest wall tenderness ABDOMEN: Soft, nontender, nondistended. Bowel sounds present. No organomegaly or mass.  EXTREMITIES: No pedal edema, cyanosis, or clubbing.  NEUROLOGIC: Cranial nerves II through XII are intact. Muscle strength 5/5 in all extremities. Sensation intact. Gait not checked.  PSYCHIATRIC: The patient is  alert and oriented x 3.   SKIN: No obvious rash, lesion, or ulcer.   LABORATORY PANEL:   CBC Recent Labs  Lab 12/19/17 1113  WBC 6.7  HGB 12.2  HCT 38.2  PLT 148*   ------------------------------------------------------------------------------------------------------------------  Chemistries  Recent Labs  Lab 12/19/17 1113  NA 136  K 3.8  CL 107  CO2 22  GLUCOSE 320*  BUN 19  CREATININE 1.75*  CALCIUM 8.5*   ------------------------------------------------------------------------------------------------------------------  Cardiac Enzymes Recent Labs  Lab 12/19/17 1113  TROPONINI <0.03   ------------------------------------------------------------------------------------------------------------------  RADIOLOGY:  Dg Chest 1 View  Result Date: 12/19/2017 CLINICAL DATA:  Chest pain. EXAM: CHEST  1 VIEW COMPARISON:  Chest x-ray dated December 17, 2017. FINDINGS: The heart size and mediastinal contours are within normal limits. Unchanged right-sided aortic arch. Normal pulmonary vascularity. No focal consolidation, pleural effusion, or pneumothorax. No acute osseous abnormality. IMPRESSION: No active disease. Electronically Signed   By: Titus Dubin M.D.   On: 12/19/2017 14:19    EKG:   Orders placed or performed during the hospital encounter of 12/19/17  . EKG 12-Lead  . EKG 12-Lead  . ED EKG within 10 minutes  . ED EKG within 10 minutes    IMPRESSION AND PLAN:   Jowanna Loeffler Tobin [name]  is a 47 y.o. female with a known history of CAD status post PCI, CKD stage III, history of stroke with minimal left-sided weakness, diabetes, hypertension, GERD, anemia of chronic disease presents to hospital secondary to worsening chest pain.  1.  Unstable angina-admit to telemetry -Cardiology consulted.  Started on heparin drip -First troponin is negative, recycle troponins -Continue aspirin, Plavix, Imdur and statin -Possible cardiac catheterization in a.m.  2.  Hypertension-patient on Norvasc,  Imdur, metoprolol and losartan -IV hydralazine PRN  3.  Hypothyroidism-continue Synthroid  4.  Depression and anxiety-continue home medications  5.  DVT prophylaxis-already on heparin drip   All the records are reviewed and case discussed with ED provider. Management plans discussed with the patient, family and they are in agreement.  CODE STATUS: Full Code  TOTAL TIME TAKING CARE OF THIS PATIENT: 50 minutes.    Gladstone Lighter M.D on 12/19/2017 at 3:25 PM  Between 7am to 6pm - Pager - [phone]  After 6pm go to [email or web address] - password [email or web address] [facility]  Office  [phone]  CC: Primary care physician; [name], MD

## 2017-12-19 NOTE — Progress Notes (Signed)
ANTICOAGULATION CONSULT NOTE - Initial Consult  Pharmacy Consult for heparin Indication: chest pain/ACS  Allergies  Allergen Reactions  . Lipitor [Atorvastatin] Other (See Comments)    Leg pains  . Tramadol Other (See Comments)    Mouth feels like it's on fire    Patient Measurements: Height: '5\' 2"'  (157.5 cm) Weight: 169 lb 4.8 oz (76.8 kg) IBW/kg (Calculated) : 50.1 Heparin Dosing Weight: 78 kg  Vital Signs: Temp: 97.5 F (36.4 C) (11/17 2052) Temp Source: Oral (11/17 2052) BP: 188/85 (11/17 2052) Pulse Rate: 62 (11/17 2052)  Labs: Recent Labs    12/17/17 1046 12/19/17 1113 12/19/17 1625 12/19/17 1727 12/19/17 2235  HGB 13.2 12.2  --   --   --   HCT 41.3 38.2  --   --   --   PLT 191 148*  --   --   --   APTT  --   --  31  --   --   LABPROT  --   --  12.7  --   --   INR  --   --  0.96  --   --   HEPARINUNFRC  --   --   --   --  0.30  CREATININE 1.58* 1.75*  --   --   --   TROPONINI <0.03 <0.03  --  <0.03 <0.03    Estimated Creatinine Clearance: 37.3 mL/min (A) (by C-G formula based on SCr of 1.75 mg/dL (H)).   Medical History: Past Medical History:  Diagnosis Date  . Anemia    iron deficiency anemia  . Aortic arch aneurysm ([place])   . Bipolar disorder ([place])   . BRCA negative 2014  . CAD (coronary artery disease)    a. 08/2003 Cath: LAD 30-40-med Rx; b. 11/2014 PCI: LAD 54m(3.25x23 Xience Alpine DES); c. 06/2015 PCI: D1 (2.25x12 Resolute Integrity DES); d. 06/2017 PCI: Patent mLAD stent, D2 95 (PTCA); e. 09/2017 Cath/PCI: LM nl, LAD 379m70d, D1 40, D2 99ost (CBA), LCX 40p, RCA 40ost/p.  . Marland KitchenKD (chronic kidney disease), stage III (HCRoan[place]  . Colon polyp   . CVA (cerebral vascular accident) (HCBentonville   Left side weakness.   . Diabetes (HCWeston  . Family history of breast cancer    BRCA neg 2014  . Gastric ulcer 04/27/2011  . History of echocardiogram    a. 03/2017 Echo: EF 60-65%, no rwma.  . Marland KitchenTN (hypertension)   . Hyperlipemia   . Hypothyroid   . Malignant  melanoma of skin of scalp (HCCalvert City  . MI, acute, non ST segment elevation (HCPort Reading  . Neuromuscular disorder (HCTaylor Springs  . S/P drug eluting coronary stent placement 06/04/2015  . Sepsis (HCMattapoisett Center2/14/2019    Medications:  Scheduled:  . [START ON 12/20/2017] amLODipine  5 mg Oral Daily  . [START ON 12/20/2017] aspirin  81 mg Oral Daily  . [START ON 12/20/2017] buPROPion  150 mg Oral Daily  . calcium-vitamin D  1 tablet Oral BID  . [START ON 12/20/2017] citalopram  40 mg Oral Daily  . [START ON 12/20/2017] clopidogrel  75 mg Oral Q breakfast  . gabapentin  300 mg Oral BID  . insulin aspart  0-5 Units Subcutaneous QHS  . insulin aspart  0-9 Units Subcutaneous TID WC  . [START ON 12/20/2017] isosorbide mononitrate  120 mg Oral Daily  . [START ON 12/20/2017] levothyroxine  25 mcg Oral Q0600  . lipase/protease/amylase  72,000 Units Oral TID WC  . [  START ON 12/20/2017] losartan  25 mg Oral Daily  . [START ON 12/20/2017] metoprolol succinate  50 mg Oral Q breakfast  . [START ON 12/20/2017] multivitamin with minerals  1 tablet Oral Daily  . pantoprazole  40 mg Oral BID  . pyridostigmine  60 mg Oral TID  . rosuvastatin  10 mg Oral q1800  . sodium chloride flush  3 mL Intravenous Q12H  . [START ON 12/20/2017] vitamin B-12  1,000 mcg Oral Daily   Infusions:  . sodium chloride    . [START ON 12/20/2017] sodium chloride    . heparin 950 Units/hr (12/19/17 1644)   PRN: sodium chloride, acetaminophen **OR** acetaminophen, ALPRAZolam, hydrALAZINE, lipase/protease/amylase, morphine injection, nitroGLYCERIN, ondansetron **OR** ondansetron (ZOFRAN) IV, oxyCODONE, sodium chloride flush  Assessment: Patient presented to ER with left-sided chest which went down the left arm. Pharmacy consulted to start heparin.   11/17 Infusion started at 950 units/hr   Goal of Therapy:  Heparin level 0.3-0.7 units/ml Monitor platelets by anticoagulation protocol: Yes   Plan:  11/17 @ 2235 HL: 0.30. Level is therapeutic  x 1. Since level is only slightly therapeutic, will increase infusion to 1000 units/hr and recheck confirmatory level in 6 hours. CBC with AM labs per protocol.  Continue to monitor H&H and platelets  Pernell Dupre, PharmD, BCPS Clinical Pharmacist 12/19/2017 11:37 PM

## 2017-12-19 NOTE — Progress Notes (Signed)
ANTICOAGULATION CONSULT NOTE - Initial Consult  Pharmacy Consult for heparin Indication: chest pain/ACS  Allergies  Allergen Reactions  . Lipitor [Atorvastatin] Other (See Comments)    Leg pains  . Tramadol Other (See Comments)    Mouth feels like it's on fire    Patient Measurements: Height: '5\' 2"'  (157.5 cm) Weight: 171 lb 15.3 oz (78 kg) IBW/kg (Calculated) : 50.1 Heparin Dosing Weight: 78 kg  Vital Signs: Temp: 98.1 F (36.7 C) (11/17 1517) Temp Source: Oral (11/17 1517) BP: 182/115 (11/17 1517) Pulse Rate: 70 (11/17 1517)  Labs: Recent Labs    12/17/17 1046 12/19/17 1113  HGB 13.2 12.2  HCT 41.3 38.2  PLT 191 148*  CREATININE 1.58* 1.75*  TROPONINI <0.03 <0.03    Estimated Creatinine Clearance: 37.6 mL/min (A) (by C-G formula based on SCr of 1.75 mg/dL (H)).   Medical History: Past Medical History:  Diagnosis Date  . Anemia    iron deficiency anemia  . Aortic arch aneurysm ([place])   . Bipolar disorder ([place])   . BRCA negative 2014  . CAD (coronary artery disease)    a. 08/2003 Cath: LAD 30-40-med Rx; b. 11/2014 PCI: LAD 11m(3.25x23 Xience Alpine DES); c. 06/2015 PCI: D1 (2.25x12 Resolute Integrity DES); d. 06/2017 PCI: Patent mLAD stent, D2 95 (PTCA); e. 09/2017 Cath/PCI: LM nl, LAD 374m70d, D1 40, D2 99ost (CBA), LCX 40p, RCA 40ost/p.  . Marland KitchenKD (chronic kidney disease), stage III (HCWind[place]  . Colon polyp   . CVA (cerebral vascular accident) (HCMeservey   Left side weakness.   . Diabetes (HCBuck [place]  . Family history of breast cancer    BRCA neg 2014  . Gastric ulcer 04/27/2011  . History of echocardiogram    a. 03/2017 Echo: EF 60-65%, no rwma.  . Marland KitchenTN (hypertension)   . Hyperlipemia   . Hypothyroid   . Malignant melanoma of skin of scalp (HCMilan  . MI, acute, non ST segment elevation (HCWaterflow  . Neuromuscular disorder (HCNicasio  . S/P drug eluting coronary stent placement 06/04/2015  . Sepsis (HCHelena2/14/2019    Medications:  Scheduled:   Infusions:   PRN:  hydrALAZINE  Assessment: Patient presented to ER with left-sided chest which went down the left arm. Pharmacy consulted to start heparin.   Goal of Therapy:  Heparin level 0.3-0.7 units/ml Monitor platelets by anticoagulation protocol: Yes   Plan:  Give 4000 units bolus x 1 Start heparin infusion at 950 units/hr Check anti-Xa level in 6 hours and daily while on heparin Continue to monitor H&H and platelets  KiOswald HillockPharmD Clinical Pharmacist 12/19/2017,3:37 PM

## 2017-12-19 NOTE — ED Notes (Signed)
Pt pulled from triage line, EKG performed. Pt in NAD.

## 2017-12-19 NOTE — ED Provider Notes (Signed)
[facility] Emergency Department Provider Note  ____________________________________________   First MD Initiated Contact with Patient 12/19/17 1214     (approximate)  I have reviewed the triage vital signs and the nursing notes.   HISTORY  Chief Complaint Chest Pain   HPI [name] is a 47 y.o. female with a history of CAD status post both stents as well as balloon angioplasty with last intervention of balloon angioplasty done this past August was presented to the emergency department chest pain.  Says that her pain is a burning pain to the left side of the chest which radiates into the left shoulder.  She says it is associated with shortness of breath and worsens with exertion.  Says that she has been taking nitroglycerin frequently since this past Thursday when she was evaluated last in the emergency department.  She states that the pain does improve with nitroglycerin but her pain is a 9 out of 10 at this time.  Says that she was unable to restart Ranexa because it is too expensive and has also been ineffective in the past.  The plan at her last discharge was to restart her Ranexa.  However, she says that she is unable to tolerate the pain at home and has been in the back to the emergency department today.  Past Medical History:  Diagnosis Date  . Anemia    iron deficiency anemia  . Aortic arch aneurysm ([place])   . Bipolar disorder ([place])   . BRCA negative 2014  . CAD (coronary artery disease)    a. 08/2003 Cath: LAD 30-40-med Rx; b. 11/2014 PCI: LAD 36m(3.25x23 Xience Alpine DES); c. 06/2015 PCI: D1 (2.25x12 Resolute Integrity DES); d. 06/2017 PCI: Patent mLAD stent, D2 95 (PTCA); e. 09/2017 Cath/PCI: LM nl, LAD 347m70d, D1 40, D2 99ost (CBA), LCX 40p, RCA 40ost/p.  . Marland KitchenKD (chronic kidney disease), stage III (HCLeonia  . Colon polyp   . CVA (cerebral vascular accident) (HCGoldville   Left side weakness.   . Diabetes (HCGreenville  . Family history of breast cancer      BRCA neg 2014  . Gastric ulcer 04/27/2011  . History of echocardiogram    a. 03/2017 Echo: EF 60-65%, no rwma.  . Marland KitchenTN (hypertension)   . Hyperlipemia   . Hypothyroid   . Malignant melanoma of skin of scalp (HCHappys Inn  . MI, acute, non ST segment elevation (HCSherrill  . Neuromuscular disorder (HCOktibbeha  . S/P drug eluting coronary stent placement 06/04/2015  . Sepsis (HCAransas Pass2/14/2019    Patient Active Problem List   Diagnosis Date Noted  . MI, acute, non ST segment elevation (HCBaltic  . Effort angina (HCJonesville05/28/2019  . Unstable angina (HCPark City05/23/2019  . Syncope 04/09/2017  . Insomnia 03/18/2017  . Ischemic cardiomyopathy   . Arthritis   . Anxiety   . Acute colitis 01/27/2017  . Hx of colonic polyps   . Bariatric surgery status   . Sinus tachycardia 02/27/2016  . H/O medication noncompliance 12/14/2015  . Acute pyelonephritis   . Emesis   . CKD (chronic kidney disease), stage IV (HCCotter  . Atherosclerosis of native coronary artery of native heart with stable angina pectoris (HCFort Lawn  . Hypertensive heart disease   . CKD (chronic kidney disease), stage III (HCArlington05/04/2015  . Presence of drug coated stent in LAD coronary artery & now in D1 06/04/2015  . Colitis 06/03/2015  .  Carotid stenosis 04/30/2015  . Type 2 diabetes mellitus with stage 3 chronic kidney disease, without long-term current use of insulin ([place])   . Atypical chest pain   . Stable angina pectoris ([place]) 04/17/2015  . Iron deficiency anemia 03/22/2015  . Vitamin B12 deficiency 02/18/2015  . Misuse of medications for pain 02/18/2015  . Severe recurrent major depression without psychotic features ([place]) 02/15/2015  . Helicobacter pylori infection 11/23/2014  . Hemiparesis, left ([place]) 11/23/2014  . Benign neoplasm of colon 11/20/2014  . Malignant melanoma ([place]) 08/25/2014  . Chronic systolic CHF (congestive heart failure) ([place])   . Incomplete bladder emptying 07/12/2014  . Adult hypothyroidism 12/30/2013  . Aberrant  subclavian artery 11/17/2013  . Multiple sclerosis ([place]) 11/02/2013  . History of CVA (cerebrovascular accident) 06/20/2013  . Headache, migraine 05/29/2013  . Hyperlipidemia   . GERD (gastroesophageal reflux disease)   . Neuropathy ([place]) 01/02/2011  . CVA (cerebral vascular accident) ([place]) 06/21/2008  . Essential hypertension 05/01/2008    Past Surgical History:  Procedure Laterality Date  . APPENDECTOMY    . CARDIAC CATHETERIZATION N/A 11/09/2014   Procedure: Coronary Angiography;  Surgeon: Minna Merritts, MD;  Location: [place] [facility];  Service: Cardiovascular;  Laterality: N/A;  . CARDIAC CATHETERIZATION N/A 11/12/2014   Procedure: Coronary Stent Intervention;  Surgeon: Isaias Cowman, MD;  Location: [place] [facility];  Service: Cardiovascular;  Laterality: N/A;  . CARDIAC CATHETERIZATION N/A 04/18/2015   Procedure: Left Heart Cath and Coronary Angiography;  Surgeon: Minna Merritts, MD;  Location: [place] [facility] LAB;  Service: Cardiovascular;  Laterality: N/A;  . CARDIAC CATHETERIZATION Left 06/04/2015   Procedure: Left Heart Cath and Coronary Angiography;  Surgeon: Wellington Hampshire, MD;  Location: [place] CV LAB;  Service: Cardiovascular;  Laterality: Left;  . CARDIAC CATHETERIZATION N/A 06/04/2015   Procedure: Coronary Stent Intervention;  Surgeon: Wellington Hampshire, MD;  Location: [place] CV LAB;  Service: Cardiovascular;  Laterality: N/A;  . CESAREAN SECTION  2001  . CHOLECYSTECTOMY N/A 11/18/2016   Procedure: LAPAROSCOPIC CHOLECYSTECTOMY WITH INTRAOPERATIVE CHOLANGIOGRAM;  Surgeon: Christene Lye, MD;  Location: [facility] ORS;  Service: General;  Laterality: N/A;  . COLONOSCOPY WITH PROPOFOL N/A 04/27/2016   Procedure: COLONOSCOPY WITH PROPOFOL;  Surgeon: [name], MD;  Location: [place];  Service: Endoscopy;  Laterality: N/A;  . CORONARY ANGIOPLASTY    . CORONARY BALLOON ANGIOPLASTY N/A 06/29/2017   Procedure: CORONARY BALLOON  ANGIOPLASTY;  Surgeon: Wellington Hampshire, MD;  Location: [place] CV LAB;  Service: Cardiovascular;  Laterality: N/A;  . CORONARY BALLOON ANGIOPLASTY N/A 09/20/2017   Procedure: CORONARY BALLOON ANGIOPLASTY;  Surgeon: Wellington Hampshire, MD;  Location: [place] [facility];  Service: Cardiovascular;  Laterality: N/A;  . DILATION AND CURETTAGE OF UTERUS    . ESOPHAGOGASTRODUODENOSCOPY (EGD) WITH PROPOFOL N/A 09/14/2014   Procedure: ESOPHAGOGASTRODUODENOSCOPY (EGD) WITH PROPOFOL;  Surgeon: Josefine Class, MD;  [name];  Service: Endoscopy;  Laterality: N/A;  . ESOPHAGOGASTRODUODENOSCOPY (EGD) WITH PROPOFOL N/A 04/27/2016   Procedure: ESOPHAGOGASTRODUODENOSCOPY (EGD) WITH PROPOFOL;  Surgeon: [name], MD;  Location: [place];  Service: Endoscopy;  Laterality: N/A;  Diabetic - oral meds  . GASTRIC BYPASS  09/2009   [facility]   . Left Carotid to sublcavian artery bypass w/ subclavian artery ligation     a. Performed @ [place].  . LEFT HEART CATH AND CORONARY ANGIOGRAPHY Left 06/29/2017   Procedure: LEFT HEART CATH AND CORONARY ANGIOGRAPHY;  Surgeon:  Wellington Hampshire, MD;  Location: [place] CV LAB;  Service: Cardiovascular;  Laterality: Left;  . LEFT HEART CATH AND CORONARY ANGIOGRAPHY N/A 09/20/2017   Procedure: LEFT HEART CATH AND CORONARY ANGIOGRAPHY;  Surgeon: Wellington Hampshire, MD;  Location: [place] CV LAB;  Service: Cardiovascular;  Laterality: N/A;  . MELANOMA EXCISION  2016   Dr. Evorn Gong  . [place]  2002  . RIGHT OOPHORECTOMY    . SHOULDER ARTHROSCOPY WITH OPEN ROTATOR CUFF REPAIR Right 01/07/2016   Procedure: SHOULDER ARTHROSCOPY WITH DEBRIDMENT, SUBACHROMIAL DECOMPRESSION;  Surgeon: Corky Mull, MD;  Location: [facility] ORS;  Service: Orthopedics;  Laterality: Right;  . SHOULDER ARTHROSCOPY WITH OPEN ROTATOR CUFF REPAIR Right 03/16/2017   Procedure: SHOULDER ARTHROSCOPY WITH OPEN ROTATOR CUFF REPAIR POSSIBLE BICEPS  TENODESIS;  Surgeon: Corky Mull, MD;  Location: [facility] ORS;  Service: Orthopedics;  Laterality: Right;  . TRIGGER FINGER RELEASE Right     [place]    Prior to Admission medications   Medication Sig Start Date End Date Taking? Authorizing Provider  amLODipine (NORVASC) 5 MG tablet Take 1 tablet (5 mg total) by mouth daily. 11/29/17  Yes Minna Merritts, MD  aspirin 81 MG chewable tablet Chew 1 tablet (81 mg total) by mouth daily. Patient taking differently: Chew 324 mg by mouth daily.  07/01/17  Yes Dunn, Areta Haber, PA-C  Blood Glucose Monitoring Suppl (ONE TOUCH ULTRA 2) w/Device KIT Use to check blood sugar once a day. Dx. E11.9 03/18/17  Yes [name], MD  buPROPion (WELLBUTRIN XL) 150 MG 24 hr tablet TAKE 1 TABLET BY MOUTH DAILY 08/10/17  Yes [name], MD  calcium-vitamin D (OSCAL 500/200 D-3) 500-200 MG-UNIT tablet Take 1 tablet by mouth 2 (two) times daily. 11/04/17 11/04/18 Yes Cuthriell, Charline Bills, PA-C  citalopram (CELEXA) 40 MG tablet Take 1 tablet (40 mg total) by mouth daily. 06/08/17  Yes [name], MD  clopidogrel (PLAVIX) 75 MG tablet Take 1 tablet (75 mg total) by mouth daily with breakfast. 07/01/17  Yes Dunn, Areta Haber, PA-C  Cyanocobalamin (VITAMIN B 12 PO) Take 1,000 mcg by mouth daily.    Yes [provider]  gabapentin (NEURONTIN) 300 MG capsule TAKE 1 CAPSULE BY MOUTH TWICE A DAY Patient taking differently: Take 300 mg by mouth 2 (two) times daily.  10/30/17  Yes [name], MD  isosorbide mononitrate (IMDUR) 120 MG 24 hr tablet Take 1 tablet (120 mg total) by mouth daily. 10/13/17  Yes Dunn, Areta Haber, PA-C  levothyroxine (SYNTHROID, LEVOTHROID) 25 MCG tablet Take 1 tablet (25 mcg total) daily before breakfast by mouth. 12/18/16  Yes [name], MD  lipase/protease/amylase (CREON) 36000 UNITS CPEP capsule Take 36,000-72,000 Units by mouth See admin instructions. Take 2 capsules (72,000u) three times daily with first bite of meals and 1  capsule (36,000u) daily with first bite of snacks 11/24/17  Yes [provider]  losartan (COZAAR) 25 MG tablet Take 1/2 tablet (12.5 mg) by mouth once daily Patient taking differently: Take 25 mg by mouth daily. Take 1/2 tablet (12.5 mg) by mouth once daily 10/13/17  Yes Dunn, Areta Haber, PA-C  metoprolol succinate (TOPROL-XL) 50 MG 24 hr tablet Take 1 tablet (50 mg total) by mouth daily. Take with or immediately following a meal. 07/01/17  Yes Dunn, Areta Haber, PA-C  Multiple Vitamin (MULTIVITAMIN WITH MINERALS) TABS tablet Take 1 tablet by mouth daily. 04/14/17  Yes Gouru, Illene Silver, MD  nitroGLYCERIN (NITROSTAT) 0.4  MG SL tablet Place 1 tablet (0.4 mg total) under the tongue every 5 (five) minutes as needed for chest pain. 06/02/17  Yes Gollan, Kathlene November, MD  pantoprazole (PROTONIX) 40 MG tablet Take 1 tablet (40 mg total) by mouth 2 (two) times daily. 05/11/16  Yes [name], MD  promethazine (PHENERGAN) 25 MG tablet Take 1 tablet (25 mg total) by mouth every 6 (six) hours as needed for nausea or vomiting. 10/20/17  Yes [name], MD  pyridostigmine (MESTINON) 60 MG tablet Take 60 mg by mouth 3 (three) times daily.  07/26/17  Yes [provider]  rosuvastatin (CRESTOR) 10 MG tablet Take 1 tablet (10 mg total) by mouth daily. 08/10/17  Yes Gollan, Kathlene November, MD  ALPRAZolam (XANAX) 1 MG tablet TAKE 1 TABLET (1 MG TOTAL) BY MOUTH DAILY AS NEEDED FOR ANXIETY. 12/14/17   [name], MD  hyoscyamine (LEVSIN SL) 0.125 MG SL tablet Place 1 tablet (0.125 mg total) under the tongue every 6 (six) hours as needed. Patient taking differently: Place 0.125 mg under the tongue every 6 (six) hours as needed for cramping.  02/01/17   [name], MD  ranolazine (RANEXA) 1000 MG SR tablet 1/2 tab twice per day for 1 week and then increase to 1 tablet twice per day Patient not taking: Reported on 12/19/2017 12/17/17   Lisa Roca, MD    Allergies Lipitor [atorvastatin] and Tramadol  Family  History  Problem Relation Age of Onset  . Hypertension Mother   . Anxiety disorder Mother   . Depression Mother   . Bipolar disorder Mother   . Heart disease Mother        No details  . Hyperlipidemia Mother   . Kidney disease Father   . Heart disease Father 44  . Hypertension Father   . Diabetes Father   . Stroke Father   . Colon cancer Father        dx in his 47's  . Anxiety disorder Father   . Depression Father   . Skin cancer Father   . Kidney disease Sister   . Thyroid nodules Sister   . Hypertension Sister   . Hypertension Sister   . Diabetes Sister   . Hyperlipidemia Sister   . Depression Sister   . Breast cancer Maternal Aunt 51  . Breast cancer Maternal Aunt 60  . Ovarian cancer Cousin   . Colon cancer Cousin   . Breast cancer Other   . Kidney cancer Neg Hx   . Bladder Cancer Neg Hx     Social History Social History   Tobacco Use  . Smoking status: Former Smoker    Types: Cigarettes    Last attempt to quit: 08/31/1994    Years since quitting: 23.3  . Smokeless tobacco: Never Used  . Tobacco comment: quit 28 years ago  Substance Use Topics  . Alcohol use: No    Alcohol/week: 0.0 standard drinks  . Drug use: No    Review of Systems  Constitutional: No fever/chills Eyes: No visual changes. ENT: No sore throat. Cardiovascular: As above Respiratory: As above Gastrointestinal: No abdominal pain.  No nausea, no vomiting.  No diarrhea.  No constipation. Genitourinary: Negative for dysuria. Musculoskeletal: Negative for back pain. Skin: Negative for rash. Neurological: Negative for headaches, focal weakness or numbness.   ____________________________________________   PHYSICAL EXAM:  VITAL SIGNS: ED Triage Vitals  Enc Vitals Group     BP 12/19/17 1108 (!) 190/89  Pulse Rate 12/19/17 1108 89     Resp 12/19/17 1108 18     Temp 12/19/17 1108 98.3 F (36.8 C)     Temp Source 12/19/17 1108 Oral     SpO2 12/19/17 1108 99 %     Weight  12/19/17 1109 171 lb 15.3 oz (78 kg)     Height 12/19/17 1109 '5\' 2"'  (1.575 m)     Head Circumference --      Peak Flow --      Pain Score 12/19/17 1108 9     Pain Loc --      Pain Edu? --      Excl. in [place]? --     Constitutional: Alert and oriented. Well appearing and in no acute distress. Eyes: Conjunctivae are normal.  Head: Atraumatic. Nose: No congestion/rhinnorhea. Mouth/Throat: Mucous membranes are moist.  Neck: No stridor.   Cardiovascular: Normal rate, regular rhythm. Grossly normal heart sounds.   Respiratory: Normal respiratory effort.  No retractions. Lungs CTAB. Gastrointestinal: Soft and nontender. No distention. Musculoskeletal: No lower extremity tenderness nor edema.  No joint effusions. Neurologic:  Normal speech and language. No gross focal neurologic deficits are appreciated. Skin:  Skin is warm, dry and intact. No rash noted. Psychiatric: Mood and affect are normal. Speech and behavior are normal.  ____________________________________________   LABS (all labs ordered are listed, but only abnormal results are displayed)  Labs Reviewed  BASIC METABOLIC PANEL - Abnormal; Notable for the following components:      Result Value   Glucose, Bld 320 (*)    Creatinine, Ser 1.75 (*)    Calcium 8.5 (*)    GFR calc non Af Amer 33 (*)    GFR calc Af Amer 38 (*)    All other components within normal limits  CBC - Abnormal; Notable for the following components:   Platelets 148 (*)    All other components within normal limits  TROPONIN I   ____________________________________________  EKG  EKG time of 11 AM.  December 19, 2017 Rate is 87 bpm.  Normal sinus rhythm.  Normal intervals.  Normal axis.  Left ventricular hypertrophy present with repolarization abnormality.  T wave inversions in aVL with biphasic T waves in 1 as well as V6.  No significant change from previous. ____________________________________________  RADIOLOGY  Chest x-ray without active  disease. ____________________________________________   PROCEDURES  Procedure(s) performed:   Procedures  Critical Care performed:   ____________________________________________   INITIAL IMPRESSION / ASSESSMENT AND PLAN / ED COURSE  Pertinent labs & imaging results that were available during my care of the patient were reviewed by me and considered in my medical decision making (see chart for details).  Differential diagnosis includes, but is not limited to, ACS, aortic dissection, pulmonary embolism, cardiac tamponade, pneumothorax, pneumonia, pericarditis, myocarditis, GI-related causes including esophagitis/gastritis, and musculoskeletal chest wall pain.   As part of my medical decision making, I reviewed the following data within the electronic MEDICAL RECORD NUMBER Notes from prior ED visits  ----------------------------------------- 230 PM on 12/19/2017 -----------------------------------------  Patient given morphine and says that her pain is tolerable amount of 4-5 out of 10 at this time.  No distress currently.  Discussed case with Dr. [name] who discussed the case with Dr.Arida who recommends that the patient be seen this coming Wednesday as an outpatient catheterization in [facility] because of the comp gated nature of the location of her coronary atherosclerosis.  However, Dr. [name] states that the patient is unable to tolerate the  pain that she may be admitted here and be evaluated here as well.  I discussed this with the patient who would prefer to be admitted at this time.  Signed out to Dr. Verdell Carmine of the medicine service. ____________________________________________   FINAL CLINICAL IMPRESSION(S) / ED DIAGNOSES  Angina.  NEW MEDICATIONS STARTED DURING THIS VISIT:  New Prescriptions   No medications on file     Note:  This document was prepared using Dragon voice recognition software and may include unintentional dictation errors.     Orbie Pyo, MD 12/19/17 [phone]

## 2017-12-20 ENCOUNTER — Encounter: Payer: Self-pay | Admitting: Cardiovascular Disease

## 2017-12-20 ENCOUNTER — Encounter
Admission: EM | Disposition: A | Discharge status: Home / Self Care | Payer: Self-pay | Source: Home / Self Care | Attending: Emergency Medicine

## 2017-12-20 DIAGNOSIS — I10 Essential (primary) hypertension: Secondary | ICD-10-CM | POA: Diagnosis not present

## 2017-12-20 DIAGNOSIS — I25118 Atherosclerotic heart disease of native coronary artery with other forms of angina pectoris: Secondary | ICD-10-CM | POA: Diagnosis not present

## 2017-12-20 DIAGNOSIS — I200 Unstable angina: Secondary | ICD-10-CM | POA: Diagnosis not present

## 2017-12-20 DIAGNOSIS — I25110 Atherosclerotic heart disease of native coronary artery with unstable angina pectoris: Secondary | ICD-10-CM | POA: Diagnosis not present

## 2017-12-20 DIAGNOSIS — E039 Hypothyroidism, unspecified: Secondary | ICD-10-CM | POA: Diagnosis not present

## 2017-12-20 DIAGNOSIS — I2510 Atherosclerotic heart disease of native coronary artery without angina pectoris: Secondary | ICD-10-CM | POA: Diagnosis not present

## 2017-12-20 HISTORY — PX: LEFT HEART CATH AND CORONARY ANGIOGRAPHY: CATH118249

## 2017-12-20 LAB — CBC
HCT: 38.9 % (ref 36.0–46.0)
Hemoglobin: 12.4 g/dL (ref 12.0–15.0)
MCH: 26 pg (ref 26.0–34.0)
MCHC: 31.9 g/dL (ref 30.0–36.0)
MCV: 81.6 fL (ref 80.0–100.0)
PLATELETS: 158 10*3/uL (ref 150–400)
RBC: 4.77 MIL/uL (ref 3.87–5.11)
RDW: 13.8 % (ref 11.5–15.5)
WBC: 8.8 10*3/uL (ref 4.0–10.5)
nRBC: 0 % (ref 0.0–0.2)

## 2017-12-20 LAB — BASIC METABOLIC PANEL
ANION GAP: 5 (ref 5–15)
BUN: 17 mg/dL (ref 6–20)
CALCIUM: 8.5 mg/dL — AB (ref 8.9–10.3)
CO2: 28 mmol/L (ref 22–32)
Chloride: 105 mmol/L (ref 98–111)
Creatinine, Ser: 1.65 mg/dL — ABNORMAL HIGH (ref 0.44–1.00)
GFR calc Af Amer: 41 mL/min — ABNORMAL LOW (ref >60)
GFR, EST NON AFRICAN AMERICAN: 36 mL/min — AB (ref >60)
GLUCOSE: 130 mg/dL — AB (ref 70–99)
Potassium: 3.5 mmol/L (ref 3.5–5.1)
Sodium: 138 mmol/L (ref 135–145)

## 2017-12-20 LAB — GLUCOSE, CAPILLARY
GLUCOSE-CAPILLARY: 254 mg/dL — AB (ref 70–99)
GLUCOSE-CAPILLARY: 109 mg/dL — AB (ref 70–99)
Glucose-Capillary: 102 mg/dL — ABNORMAL HIGH (ref 70–99)
Glucose-Capillary: 119 mg/dL — ABNORMAL HIGH (ref 70–99)

## 2017-12-20 LAB — HEMOGLOBIN A1C
Hgb A1c MFr Bld: 5.7 % — ABNORMAL HIGH (ref 4.8–5.6)
MEAN PLASMA GLUCOSE: 116.89 mg/dL

## 2017-12-20 LAB — TROPONIN I

## 2017-12-20 LAB — HEPARIN LEVEL (UNFRACTIONATED): Heparin Unfractionated: 0.31 IU/mL (ref 0.30–0.70)

## 2017-12-20 SURGERY — LEFT HEART CATH AND CORONARY ANGIOGRAPHY
Anesthesia: Moderate Sedation

## 2017-12-20 MED ORDER — MIDAZOLAM HCL 2 MG/2ML IJ SOLN
INTRAMUSCULAR | Status: DC | PRN
  Administered 2017-12-20: 1 mg via INTRAVENOUS

## 2017-12-20 MED ORDER — ENOXAPARIN SODIUM 40 MG/0.4ML ~~LOC~~ SOLN
40.0000 mg | SUBCUTANEOUS | Status: DC
  Administered 2017-12-20: 40 mg via SUBCUTANEOUS
  Filled 2017-12-20 (×2): qty 0.4

## 2017-12-20 MED ORDER — SODIUM CHLORIDE 0.9 % IV SOLN: 250.0000 mL | INTRAVENOUS | Status: DC | PRN

## 2017-12-20 MED ORDER — FENTANYL CITRATE (PF) 100 MCG/2ML IJ SOLN
INTRAMUSCULAR | Status: DC | PRN
  Administered 2017-12-20: 25 ug via INTRAVENOUS

## 2017-12-20 MED ORDER — SODIUM CHLORIDE 0.9 % WEIGHT BASED INFUSION
1.0000 mL/kg/h | INTRAVENOUS | Status: AC
  Administered 2017-12-20: 1 mL/kg/h via INTRAVENOUS

## 2017-12-20 MED ORDER — ALPRAZOLAM 0.5 MG PO TABS
0.5000 mg | ORAL_TABLET | Freq: Three times a day (TID) | ORAL | Status: DC | PRN
  Administered 2017-12-20 – 2017-12-21 (×3): 0.5 mg via ORAL
  Filled 2017-12-20 (×3): qty 1

## 2017-12-20 MED ORDER — FENTANYL CITRATE (PF) 100 MCG/2ML IJ SOLN
INTRAMUSCULAR | Status: AC
  Filled 2017-12-20: qty 2

## 2017-12-20 MED ORDER — VERAPAMIL HCL 2.5 MG/ML IV SOLN
INTRAVENOUS | Status: AC
  Filled 2017-12-20: qty 2

## 2017-12-20 MED ORDER — VERAPAMIL HCL 2.5 MG/ML IV SOLN
INTRAVENOUS | Status: DC | PRN
  Administered 2017-12-20: 2.5 mg via INTRA_ARTERIAL

## 2017-12-20 MED ORDER — SODIUM CHLORIDE 0.9% FLUSH: 3.0000 mL | INTRAVENOUS | Status: DC | PRN

## 2017-12-20 MED ORDER — SODIUM CHLORIDE 0.9% FLUSH
3.0000 mL | Freq: Two times a day (BID) | INTRAVENOUS | Status: DC
  Administered 2017-12-20: 3 mL via INTRAVENOUS

## 2017-12-20 MED ORDER — SODIUM CHLORIDE 0.9 % IV SOLN: INTRAVENOUS | Status: DC

## 2017-12-20 MED ORDER — HEPARIN SODIUM (PORCINE) 1000 UNIT/ML IJ SOLN
INTRAMUSCULAR | Status: AC
  Filled 2017-12-20: qty 1

## 2017-12-20 MED ORDER — HEPARIN (PORCINE) IN NACL 1000-0.9 UT/500ML-% IV SOLN
INTRAVENOUS | Status: AC
  Filled 2017-12-20: qty 1000

## 2017-12-20 MED ORDER — IOPAMIDOL (ISOVUE-300) INJECTION 61%
INTRAVENOUS | Status: DC | PRN
  Administered 2017-12-20: 45 mL via INTRA_ARTERIAL

## 2017-12-20 MED ORDER — MIDAZOLAM HCL 2 MG/2ML IJ SOLN
INTRAMUSCULAR | Status: AC
  Filled 2017-12-20: qty 2

## 2017-12-20 SURGICAL SUPPLY — 6 items
CATH INFINITI 5FR TG (CATHETERS) ×2 IMPLANT
DEVICE RAD TR BAND REGULAR (VASCULAR PRODUCTS) ×2 IMPLANT
GLIDESHEATH SLEND SS 6F .021 (SHEATH) ×2 IMPLANT
KIT MANI 3VAL PERCEP (MISCELLANEOUS) ×3 IMPLANT
PACK CARDIAC CATH (CUSTOM PROCEDURE TRAY) ×3 IMPLANT
WIRE ROSEN-J .035X260CM (WIRE) ×2 IMPLANT

## 2017-12-20 NOTE — Consult Note (Signed)
Cardiology Consultation:   Patient ID: [name] MRN: [id number]; DOB: [birth year]  Admit date: 12/19/2017 Date of Consult: 12/20/2017  Primary Care Provider: [name], MD Primary Cardiologist: Ida Rogue, MD  Primary Electrophysiologist:  None    Patient Profile:   [name] is a 47 y.o. female with a hx of CAD s/p multiple prior interventions including stents, and most recently showing recurrent lesion to the second diagonal not eligible for stenting, HTN, HLD, T2DM, CKD III, prior strokes, and prior left carotid to subclavian artery bypass who is being seen today for the evaluation of unstable angina at the request of Dr. Estanislado Pandy.  History of Present Illness:   [name] is a 47 y.o. female with history of CAD status post prior interventions, hypertension, hyperlipidemia, type 2 diabetes mellitus, stage III chronic kidney disease, prior strokes, obesity status post gastric bypass, prior left carotid to subclavian artery bypass with subclavian artery ligation, anemia, bipolar disorder, and aortic arch aneurysmwho presents for follow up of her CAD.  She was initially diagnosed withCADin 08/2003, at which time catheterization revealed moderate LAD stenosis, which was medically managed.More recently, in10/2016, she required PCI and drug-eluting stent placement to her mid LAD.In 06/2015 repeat [facility] patency of the LAD stent but she had developed significant first diagonal disease and this was successfully treated with a drug-eluting stent. She hadrecurrent angina in 06/2017 and catheterization at that time showed severe stenosis in the second diagonal and this was treated with balloon angioplastygiven vessel size and angulation.She did well for a few months but began to experience recurrent chest discomfort in late7/2019and was subsequently admitted to [facility] She ruled out and underwent diagnostic LHC that showedrestenosis within the second diagonal.  This was treated withcuttingballoon angioplastyas this vessel was again not felt to be optimal for stenting due to angulation and bifurcation with the LAD. In hospital follow up (as a work in) on 8/22, she was doing relatively well. She did have an episode of chest pain with associated SOB while walking to get the mail that improved with rest prompting her to contact our office and be worked in. She noted some dyspnea while sweeping the floor prior to coming to her appointment on 8/22. Vitals were stable. EKG was not acute. She was chest pain free at the time of her visit. She was started on Imdur 30 mg (previously on this medication though stopped secondary to headache) which was subsequently escalated to 60 mg daily. She reported that Ranexa did not help.  She was most recently seen in the office on 09/30/2017.  At that time, she noted the Imdur was helping some.  She did note to breakthrough episodes of angina, both occurring at rest and both responded to sublingual nitroglycerin x1.  This pain was unchanged from her baseline.  Her Imdur was titrated to 90 mg daily.  In that setting, we decreased her losartan to 50 mg daily in an effort to avoid hypotension.  Continued medical management of her coronary artery disease was recommended given the lesion in the second diagonal was less than ideal for stenting given bifurcation with the LAD with the previously placed stent.  After start of the imdur 65m daily, she reported brief episodes of chest pain occurring at rest, each of these episodes was shorter in duration and less severe than her prior episodes.  They did not require her to take any sublingual nitroglycerin.  There were no associated symptoms.  Pain felt similar to her  prior episodes though was shorter in duration and less severe as noted above.  On 9/8, she stood up after using the restroom and felt dizzy leading to a fall/syncopal episode.  She hit the back of her head on the side of the bathtub.   Patient's son heard the fall and got her up off of the floor from the bathroom.  She did not seek medical attention at that time.  No visual changes.  She continues to have a mild headache. She did self discontinue her hydralazine given her relative hypotension and syncope.  On 12/19/2017, she presented to [facility] ED with left sided burning chest pain that radiated to the left shoulder. Associated sx included SOB and exertion. She reported taking nitro more frequently since last Thursday 11/14 when she last presented to the Emergency Department. She noted that the pain improves with nitroglycerine but on presentation was documented at 9/10/ She reportedly is unable to start Ranexa due to cost and aslo stated it had been ineffective in the past. As of her last discharge, the medical plan was to restart her Ranexa. She reportedly presented to the ED today, because she was unable to tolerate her chest pain on Ranexa.  Today 11/18 she is doing better. Yesterday 11/17 while she was at church she began to feel nauseated, diaphoretic, and had sharp central chest pain radiating to the left shoulder. It is consistent with her previous anginal chest pain but happens both during activity and at rest. Her son brought her in yesterday after she took nitro and did not get complete relief of her pain. The pain continued intermittently through the night, though she is currently chest pain free since this morning. She stated that she has continued to take her HTN medications except for the hydralazine which was stopped some time ago 2/2 to dizziness. She takes her b/p at home, and states that it was always <140/80 on her current medications. She has not been taking the Ranexa since 10/28 because she states that it was not effective and the IMDUR worked better.  She reports that for the past 2 weeks, she has had to use 3 nitros per day for relief of chest pain. She is amenable to heart cath scheduled for this afternoon.    Past  Medical History:  Diagnosis Date  . Anemia    iron deficiency anemia  . Aortic arch aneurysm ([place])   . Bipolar disorder ([place])   . BRCA negative 2014  . CAD (coronary artery disease)    a. 08/2003 Cath: LAD 30-40-med Rx; b. 11/2014 PCI: LAD 65m(3.25x23 Xience Alpine DES); c. 06/2015 PCI: D1 (2.25x12 Resolute Integrity DES); d. 06/2017 PCI: Patent mLAD stent, D2 95 (PTCA); e. 09/2017 Cath/PCI: LM nl, LAD 344m70d, D1 40, D2 99ost (CBA), LCX 40p, RCA 40ost/p.  . Marland KitchenKD (chronic kidney disease), stage III (HCLouv[place]  . Colon polyp   . CVA (cerebral vascular accident) (HC[place]   Left side weakness.   . Diabetes (HCD[place]  . Family history of breast cancer    BRCA neg 2014  . Gastric ulcer 04/27/2011  . History of echocardiogram    a. 03/2017 Echo: EF 60-65%, no rwma.  . Marland KitchenTN (hypertension)   . Hyperlipemia   . Hypothyroid   . Malignant melanoma of skin of scalp (HCSlipp[place]  . MI, acute, non ST segment elevation (HCWisconsin [place]  . Neuromuscular disorder (HCNorth V[place]  . S/P drug eluting coronary stent placement 06/04/2015  . Sepsis (  [place]) 03/18/2017    Past Surgical History:  Procedure Laterality Date  . APPENDECTOMY    . CARDIAC CATHETERIZATION N/A 11/09/2014   Procedure: Coronary Angiography;  Surgeon: Minna Merritts, MD;  Location: [place] CV LAB;  Service: Cardiovascular;  Laterality: N/A;  . CARDIAC CATHETERIZATION N/A 11/12/2014   Procedure: Coronary Stent Intervention;  Surgeon: Isaias Cowman, MD;  Location: [place] CV LAB;  Service: Cardiovascular;  Laterality: N/A;  . CARDIAC CATHETERIZATION N/A 04/18/2015   Procedure: Left Heart Cath and Coronary Angiography;  Surgeon: Minna Merritts, MD;  Location: [place] CV LAB;  Service: Cardiovascular;  Laterality: N/A;  . CARDIAC CATHETERIZATION Left 06/04/2015   Procedure: Left Heart Cath and Coronary Angiography;  Surgeon: Wellington Hampshire, MD;  Location: [place] CV LAB;  Service: Cardiovascular;  Laterality: Left;  . CARDIAC CATHETERIZATION  N/A 06/04/2015   Procedure: Coronary Stent Intervention;  Surgeon: Wellington Hampshire, MD;  Location: [place] CV LAB;  Service: Cardiovascular;  Laterality: N/A;  . CESAREAN SECTION  2001  . CHOLECYSTECTOMY N/A 11/18/2016   Procedure: LAPAROSCOPIC CHOLECYSTECTOMY WITH INTRAOPERATIVE CHOLANGIOGRAM;  Surgeon: Christene Lye, MD;  Location: [facility] ORS;  Service: General;  Laterality: N/A;  . COLONOSCOPY WITH PROPOFOL N/A 04/27/2016   Procedure: COLONOSCOPY WITH PROPOFOL;  Surgeon: [name], MD;  Location: [place];  Service: Endoscopy;  Laterality: N/A;  . CORONARY ANGIOPLASTY    . CORONARY BALLOON ANGIOPLASTY N/A 06/29/2017   Procedure: CORONARY BALLOON ANGIOPLASTY;  Surgeon: Wellington Hampshire, MD;  Location: [place] CV LAB;  Service: Cardiovascular;  Laterality: N/A;  . CORONARY BALLOON ANGIOPLASTY N/A 09/20/2017   Procedure: CORONARY BALLOON ANGIOPLASTY;  Surgeon: Wellington Hampshire, MD;  Location: [place] CV LAB;  Service: Cardiovascular;  Laterality: N/A;  . DILATION AND CURETTAGE OF UTERUS    . ESOPHAGOGASTRODUODENOSCOPY (EGD) WITH PROPOFOL N/A 09/14/2014   Procedure: ESOPHAGOGASTRODUODENOSCOPY (EGD) WITH PROPOFOL;  Surgeon: Josefine Class, MD;  [facility] ENDOSCOPY;  Service: Endoscopy;  Laterality: N/A;  . ESOPHAGOGASTRODUODENOSCOPY (EGD) WITH PROPOFOL N/A 04/27/2016   Procedure: ESOPHAGOGASTRODUODENOSCOPY (EGD) WITH PROPOFOL;  Surgeon: [name], MD;  Location: [place];  Service: Endoscopy;  Laterality: N/A;  Diabetic - oral meds  . GASTRIC BYPASS  09/2009   [facility]   . Left Carotid to sublcavian artery bypass w/ subclavian artery ligation     a. Performed @ [place].  . LEFT HEART CATH AND CORONARY ANGIOGRAPHY Left 06/29/2017   Procedure: LEFT HEART CATH AND CORONARY ANGIOGRAPHY;  Surgeon: Wellington Hampshire, MD;  Location: [place] CV LAB;  Service: Cardiovascular;  Laterality: Left;  . LEFT HEART CATH AND  CORONARY ANGIOGRAPHY N/A 09/20/2017   Procedure: LEFT HEART CATH AND CORONARY ANGIOGRAPHY;  Surgeon: Wellington Hampshire, MD;  Location: [place] CV LAB;  Service: Cardiovascular;  Laterality: N/A;  . MELANOMA EXCISION  2016   Dr. Evorn Gong  . [place]  2002  . RIGHT OOPHORECTOMY    . SHOULDER ARTHROSCOPY WITH OPEN ROTATOR CUFF REPAIR Right 01/07/2016   Procedure: SHOULDER ARTHROSCOPY WITH DEBRIDMENT, SUBACHROMIAL DECOMPRESSION;  Surgeon: Corky Mull, MD;  Location: [facility] ORS;  Service: Orthopedics;  Laterality: Right;  . SHOULDER ARTHROSCOPY WITH OPEN ROTATOR CUFF REPAIR Right 03/16/2017   Procedure: SHOULDER ARTHROSCOPY WITH OPEN ROTATOR CUFF REPAIR POSSIBLE BICEPS TENODESIS;  Surgeon: Corky Mull, MD;  Location: [facility] ORS;  Service: Orthopedics;  Laterality: Right;  . TRIGGER FINGER RELEASE Right     [place]  Home Medications:  Prior to Admission medications   Medication Sig Start Date End Date Taking? Authorizing Provider  amLODipine (NORVASC) 5 MG tablet Take 1 tablet (5 mg total) by mouth daily. 11/29/17  Yes Minna Merritts, MD  aspirin 81 MG chewable tablet Chew 1 tablet (81 mg total) by mouth daily. Patient taking differently: Chew 324 mg by mouth daily.  07/01/17  Yes Dunn, Areta Haber, PA-C  Blood Glucose Monitoring Suppl (ONE TOUCH ULTRA 2) w/Device KIT Use to check blood sugar once a day. Dx. E11.9 03/18/17  Yes [name], MD  buPROPion (WELLBUTRIN XL) 150 MG 24 hr tablet TAKE 1 TABLET BY MOUTH DAILY 08/10/17  Yes [name], MD  calcium-vitamin D (OSCAL 500/200 D-3) 500-200 MG-UNIT tablet Take 1 tablet by mouth 2 (two) times daily. 11/04/17 11/04/18 Yes Cuthriell, Charline Bills, PA-C  citalopram (CELEXA) 40 MG tablet Take 1 tablet (40 mg total) by mouth daily. 06/08/17  Yes [name], MD  clopidogrel (PLAVIX) 75 MG tablet Take 1 tablet (75 mg total) by mouth daily with breakfast. 07/01/17  Yes Dunn, Areta Haber, PA-C  Cyanocobalamin (VITAMIN B 12 PO) Take 1,000  mcg by mouth daily.    Yes [provider]  gabapentin (NEURONTIN) 300 MG capsule TAKE 1 CAPSULE BY MOUTH TWICE A DAY Patient taking differently: Take 300 mg by mouth 2 (two) times daily.  10/30/17  Yes [name], MD  isosorbide mononitrate (IMDUR) 120 MG 24 hr tablet Take 1 tablet (120 mg total) by mouth daily. 10/13/17  Yes Dunn, Areta Haber, PA-C  levothyroxine (SYNTHROID, LEVOTHROID) 25 MCG tablet Take 1 tablet (25 mcg total) daily before breakfast by mouth. 12/18/16  Yes [name], MD  lipase/protease/amylase (CREON) 36000 UNITS CPEP capsule Take 36,000-72,000 Units by mouth See admin instructions. Take 2 capsules (72,000u) three times daily with first bite of meals and 1 capsule (36,000u) daily with first bite of snacks 11/24/17  Yes [provider]  losartan (COZAAR) 25 MG tablet Take 1/2 tablet (12.5 mg) by mouth once daily Patient taking differently: Take 25 mg by mouth daily. Take 1/2 tablet (12.5 mg) by mouth once daily 10/13/17  Yes Dunn, Areta Haber, PA-C  metoprolol succinate (TOPROL-XL) 50 MG 24 hr tablet Take 1 tablet (50 mg total) by mouth daily. Take with or immediately following a meal. 07/01/17  Yes Dunn, Areta Haber, PA-C  Multiple Vitamin (MULTIVITAMIN WITH MINERALS) TABS tablet Take 1 tablet by mouth daily. 04/14/17  Yes Gouru, Illene Silver, MD  nitroGLYCERIN (NITROSTAT) 0.4 MG SL tablet Place 1 tablet (0.4 mg total) under the tongue every 5 (five) minutes as needed for chest pain. 06/02/17  Yes Gollan, Kathlene November, MD  pantoprazole (PROTONIX) 40 MG tablet Take 1 tablet (40 mg total) by mouth 2 (two) times daily. 05/11/16  Yes [name], MD  promethazine (PHENERGAN) 25 MG tablet Take 1 tablet (25 mg total) by mouth every 6 (six) hours as needed for nausea or vomiting. 10/20/17  Yes [name], MD  pyridostigmine (MESTINON) 60 MG tablet Take 60 mg by mouth 3 (three) times daily.  07/26/17  Yes [provider]  rosuvastatin (CRESTOR) 10 MG tablet Take 1 tablet  (10 mg total) by mouth daily. 08/10/17  Yes Gollan, Kathlene November, MD  ALPRAZolam (XANAX) 1 MG tablet TAKE 1 TABLET (1 MG TOTAL) BY MOUTH DAILY AS NEEDED FOR ANXIETY. 12/14/17   [name], MD  hyoscyamine (LEVSIN SL) 0.125 MG SL tablet Place  1 tablet (0.125 mg total) under the tongue every 6 (six) hours as needed. Patient taking differently: Place 0.125 mg under the tongue every 6 (six) hours as needed for cramping.  02/01/17   [name], MD  ranolazine (RANEXA) 1000 MG SR tablet 1/2 tab twice per day for 1 week and then increase to 1 tablet twice per day Patient not taking: Reported on 12/19/2017 12/17/17   Lisa Roca, MD    Inpatient Medications: Scheduled Meds: . amLODipine  5 mg Oral Daily  . aspirin  81 mg Oral Daily  . buPROPion  150 mg Oral Daily  . calcium-vitamin D  1 tablet Oral BID  . citalopram  40 mg Oral Daily  . clopidogrel  75 mg Oral Q breakfast  . gabapentin  300 mg Oral BID  . insulin aspart  0-5 Units Subcutaneous QHS  . insulin aspart  0-9 Units Subcutaneous TID WC  . isosorbide mononitrate  120 mg Oral Daily  . levothyroxine  25 mcg Oral Q0600  . lipase/protease/amylase  72,000 Units Oral TID WC  . losartan  25 mg Oral Daily  . metoprolol succinate  50 mg Oral Q breakfast  . multivitamin with minerals  1 tablet Oral Daily  . pantoprazole  40 mg Oral BID  . pyridostigmine  60 mg Oral TID  . rosuvastatin  10 mg Oral q1800  . sodium chloride flush  3 mL Intravenous Q12H  . vitamin B-12  1,000 mcg Oral Daily   Continuous Infusions: . sodium chloride    . sodium chloride 10 mL/hr at 12/20/17 0707  . heparin 1,000 Units/hr (12/20/17 0707)   PRN Meds: sodium chloride, acetaminophen **OR** acetaminophen, ALPRAZolam, hydrALAZINE, lipase/protease/amylase, morphine injection, nitroGLYCERIN, ondansetron **OR** ondansetron (ZOFRAN) IV, oxyCODONE, sodium chloride flush  Allergies:    Allergies  Allergen Reactions  . Lipitor [Atorvastatin] Other (See  Comments)    Leg pains  . Tramadol Other (See Comments)    Mouth feels like it's on fire    Social History:   Social History   Socioeconomic History  . Marital status: Divorced    Spouse name: Not on file  . Number of children: 1  . Years of education: Not on file  . Highest education level: Not on file  Occupational History  . 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 Disabled through 08/02/2013 per forms 02/03/2013  Social Needs  . Financial resource strain: Not on file  . Food insecurity:    Worry: Not on file    Inability: Not on file  . Transportation needs:    Medical: Not on file    Non-medical: Not on file  Tobacco Use  . Smoking status: Former Smoker    Types: Cigarettes    Last attempt to quit: 08/31/1994    Years since quitting: 23.3  . Smokeless tobacco: Never Used  . Tobacco comment: quit 28 years ago  Substance and Sexual Activity  . Alcohol use: No    Alcohol/week: 0.0 standard drinks  . Drug use: No  . Sexual activity: Not Currently    Birth control/protection: None  Lifestyle  . Physical activity:    Days per week: Not on file    Minutes per session: Not on file  . Stress: Not on file  Relationships  . Social connections:    Talks on phone: Not on file    Gets together: Not on file  Attends religious service: Not on file    Active member of club or organization: Not on file    Attends meetings of clubs or organizations: Not on file    Relationship status: Not on file  . Intimate partner violence:    Fear of current or ex partner: Not on file    Emotionally abused: Not on file    Physically abused: Not on file    Forced sexual activity: Not on file  Other Topics Concern  . 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   Lives at home with son    Family History:     Family History  Problem Relation Age of Onset  . Hypertension Mother   . Anxiety disorder Mother   . Depression Mother   . Bipolar disorder Mother   . Heart disease Mother        No details  . Hyperlipidemia Mother   . Kidney disease Father   . Heart disease Father 54  . Hypertension Father   . Diabetes Father   . Stroke Father   . Colon cancer Father        dx in his 92's  . Anxiety disorder Father   . Depression Father   . Skin cancer Father   . Kidney disease Sister   . Thyroid nodules Sister   . Hypertension Sister   . Hypertension Sister   . Diabetes Sister   . Hyperlipidemia Sister   . Depression Sister   . Breast cancer Maternal Aunt 34  . Breast cancer Maternal Aunt 45  . Ovarian cancer Cousin   . Colon cancer Cousin   . Breast cancer Other   . Kidney cancer Neg Hx   . Bladder Cancer Neg Hx      ROS:  Please see the history of present illness.  Positive for mild leg cramps last night. Denies current chest pain, nausea, vomiting, diaphoresis, dizziness, syncope. All other ROS reviewed and negative.     Physical Exam/Data:   Vitals:   12/19/17 1624 12/19/17 2052 12/20/17 0245 12/20/17 0720  BP:  (!) 188/85 (!) 172/92 (!) 192/83  Pulse:  62 (!) 59 (!) 57  Resp:  '18 18 16  ' Temp:  (!) 97.5 F (36.4 C) 97.8 F (36.6 C) 97.8 F (36.6 C)  TempSrc:  Oral Oral Oral  SpO2:  97% 92% 98%  Weight: 76.8 kg  77.3 kg   Height:        Intake/Output Summary (Last 24 hours) at 12/20/2017 0742 Last data filed at 12/20/2017 0707 Gross per 24 hour  Intake 175.25 ml  Output 1000 ml  Net -824.75 ml   Filed Weights   12/19/17 1109 12/19/17 1624 12/20/17 0245  Weight: 78 kg 76.8 kg 77.3 kg   Body mass index is 31.17 kg/m.  General:  Well nourished, well developed, female resting in bed in no acute distress HEENT: Normal Neck: no JVD, supple Endocrine:  No thryomegaly Vascular: No carotid bruits; RA/DP/PT pulses 2+ and symmetrical bilaterally  Cardiac:  Mildly bradycardic, normal S1, S2; RRR; no murmur Lungs:  clear to auscultation bilaterally, no wheezing, rhonchi or rales  Abd: soft, nontender, no hepatomegaly  Ext: no edema Musculoskeletal:  No deformities, BUE and BLE strength normal and equal Skin: warm and dry  Neuro:  no focal abnormalities noted Psych:  Normal affect   EKG: NSR without acute ST  Elevation Telemetry:  N/a  CV Studies:   Relevant CV Studies:  ECHO 03/19/2017 Study Conclusions - Procedure narrative: Transthoracic echocardiography. Image   quality was suboptimal. The study was technically difficult. - Left ventricle: The cavity size was normal. Systolic function was   normal. The estimated ejection fraction was in the range of 60%   to 65%. Wall motion was normal; there were no regional wall   motion abnormalities. Left ventricular diastolic function   parameters were normal. - Left atrium: The atrium was normal in size. - Right ventricle: Systolic function was normal. - Pulmonary arteries: Systolic pressure was within the normal   range.  LHC 09/20/2017 1. Significant one-vessel coronary artery disease with patent stents in the mid LAD and first diagonal. There is significant chronic disease in the distal LAD close to the apex.  Severe restenosis and ostial second diagonal at the site of previous angioplasty in May. 2. Mildly elevated left ventricular end-diastolic pressure. Left ventricular angiography was not performed due to chronic kidney disease. 3. Successful cutting balloon angioplasty of the ostial second diagonal without stent placement.  Recommendations: Aggressive treatment of risk factors. I still feel that the ostial second diagonal stenosis is not optimal for stent implantation due to angulation and bifurcation with the LAD.  If the patient develops recurrent restenosis in the same location, options include leaving the vessel to close completely and treat medically or bifurcation stenting of  the diagonal and LAD.  CARDIAC EVENT MONITOR 03/18/2015 30 day even monitor Normal sinus rhythm Periods of sinus tachycardia noted, maximum heart rate 160 bpm  Laboratory Data:  Chemistry Recent Labs  Lab 12/17/17 1046 12/19/17 1113 12/20/17 0539  NA 138 136 138  K 4.3 3.8 3.5  CL 110 107 105  CO2 21* 22 28  GLUCOSE 140* 320* 130*  BUN '13 19 17  ' CREATININE 1.58* 1.75* 1.65*  CALCIUM 8.8* 8.5* 8.5*  GFRNONAA 37* 33* 36*  GFRAA 43* 38* 41*  ANIONGAP '7 7 5    ' No results for input(s): PROT, ALBUMIN, AST, ALT, ALKPHOS, BILITOT in the last 168 hours. Hematology Recent Labs  Lab 12/17/17 1046 12/19/17 1113 12/20/17 0539  WBC 12.5* 6.7 8.8  RBC 4.95 4.64 4.77  HGB 13.2 12.2 12.4  HCT 41.3 38.2 38.9  MCV 83.4 82.3 81.6  MCH 26.7 26.3 26.0  MCHC 32.0 31.9 31.9  RDW 14.4 14.2 13.8  PLT 191 148* 158   Cardiac Enzymes Recent Labs  Lab 12/17/17 1046 12/19/17 1113 12/19/17 1727 12/19/17 2235 12/20/17 0539  TROPONINI <0.03 <0.03 <0.03 <0.03 <0.03   No results for input(s): TROPIPOC in the last 168 hours.  BNPNo results for input(s): BNP, PROBNP in the last 168 hours.  DDimer No results for input(s): DDIMER in the last 168 hours.  Radiology/Studies:  Dg Chest 1 View  Result Date: 12/19/2017 CLINICAL DATA:  Chest pain. EXAM: CHEST  1 VIEW COMPARISON:  Chest x-ray dated December 17, 2017. FINDINGS: The heart size and mediastinal contours are within normal limits. Unchanged right-sided aortic arch. Normal pulmonary vascularity. No focal consolidation, pleural effusion, or pneumothorax. No acute osseous abnormality. IMPRESSION: No active disease. Electronically Signed   By: Titus Dubin M.D.   On: 12/19/2017 14:19   Dg Chest 1 View  Result Date: 12/17/2017 CLINICAL DATA:  Chest pain since early this morning. Recent coronary artery stent placement. History of MI in May 2019 also history of sepsis. EXAM: CHEST  1 VIEW COMPARISON:  Portable chest x-ray of September 09, 2017  FINDINGS: The lungs are well-expanded and clear. The heart is top-normal in size. The pulmonary vascularity is normal. The mediastinum is normal in width. There surgical clips in the left upper paratracheal region. There is no pleural effusion. The bony thorax is unremarkable. IMPRESSION: There is no active cardiopulmonary disease. Electronically Signed   By: David  Martinique M.D.   On: 12/17/2017 11:09    Assessment and Plan:   1. Unstable angina: Pt with an extensive history of recurrent chest pain, and previous interventions. EKG without STE. Troponins negative x3, however given her history PMH including 2nd Diagonal lesion that was unable to be stented LHC is scheduled for 12:30 this afternoon. Further recommendations pending LHC. 2. HTN: Currently uncontrolled at 192/83, HR 55. Would not recommend increasing BB as no room to escalate in HR. Given h/o stroke, optimization recommended and will readdress following cardiac cath if still elevated at that time. 3. Hypokalemia: 3.5 this AM. Following cath, give potassium 20 mEq BID x1 day and recheck in AM with BMET for goal of 4.0. 4. HLD: continue Rosuvastatin. Consider updated lipid panel for LDL goal <70 5. Hypothyroid/Depression: Continue home medications, manage per IM.    For questions or updates, please contact [place] Please consult [email or web address] for contact info under     Signed, Arvil Chaco, PA-C  12/20/2017 7:42 AM

## 2017-12-20 NOTE — Care Management Obs Status (Signed)
[place] NOTIFICATION   Patient Details  Name: Sonda Coppens MRN: [id number] Date of Birth: 10-Feb-1970   Medicare Observation Status Notification Given:  Yes    Elza Rafter, RN 12/20/2017, 4:26 PM

## 2017-12-20 NOTE — Progress Notes (Signed)
ANTICOAGULATION CONSULT NOTE - Initial Consult  Pharmacy Consult for heparin Indication: chest pain/ACS  Allergies  Allergen Reactions  . Lipitor [Atorvastatin] Other (See Comments)    Leg pains  . Tramadol Other (See Comments)    Mouth feels like it's on fire    Patient Measurements: Height: _0  (157.5 cm) Weight: 170 lb 6.4 oz (77.3 kg) IBW/kg (Calculated) : 50.1 Heparin Dosing Weight: 78 kg  Vital Signs: Temp: 97.8 F (36.6 C) (11/18 0245) Temp Source: Oral (11/18 0245) BP: 172/92 (11/18 0245) Pulse Rate: 59 (11/18 0245)  Labs: Recent Labs    12/17/17 1046 12/19/17 1113 12/19/17 1625 12/19/17 1727 12/19/17 2235 12/20/17 0539  HGB 13.2 12.2  --   --   --  12.4  HCT 41.3 38.2  --   --   --  38.9  PLT 191 148*  --   --   --  158  APTT  --   --  31  --   --   --   LABPROT  --   --  12.7  --   --   --   INR  --   --  0.96  --   --   --   HEPARINUNFRC  --   --   --   --  0.30 0.31  CREATININE 1.58* 1.75*  --   --   --   --   TROPONINI <0.03 <0.03  --  <0.03 <0.03  --     Estimated Creatinine Clearance: 37.4 mL/min (A) (by C-G formula based on SCr of 1.75 mg/dL (H)).   Medical History: Past Medical History:  Diagnosis Date  . Anemia    iron deficiency anemia  . Aortic arch aneurysm ([place])   . Bipolar disorder ([place])   . BRCA negative 2014  . CAD (coronary artery disease)    a. 08/2003 Cath: LAD 30-40-med Rx; b. 11/2014 PCI: LAD 42m(3.25x23 Xience Alpine DES); c. 06/2015 PCI: D1 (2.25x12 Resolute Integrity DES); d. 06/2017 PCI: Patent mLAD stent, D2 95 (PTCA); e. 09/2017 Cath/PCI: LM nl, LAD 369m70d, D1 40, D2 99ost (CBA), LCX 40p, RCA 40ost/p.  . Marland KitchenKD (chronic kidney disease), stage III ([facility]  . Colon polyp   . CVA (cerebral vascular accident) ([facility]   Left side weakness.   . Diabetes ([facility]  . Family history of breast cancer    BRCA neg 2014  . Gastric ulcer 04/27/2011  . History of echocardiogram    a. 03/2017 Echo: EF 60-65%, no rwma.  . Marland KitchenTN  (hypertension)   . Hyperlipemia   . Hypothyroid   . Malignant melanoma of skin of scalp ([facility]  . MI, acute, non ST segment elevation ([facility]  . Neuromuscular disorder ([facility]  . S/P drug eluting coronary stent placement 06/04/2015  . Sepsis ([facility]2/14/2019    Medications:  Scheduled:  . amLODipine  5 mg Oral Daily  . aspirin  81 mg Oral Daily  . buPROPion  150 mg Oral Daily  . calcium-vitamin D  1 tablet Oral BID  . citalopram  40 mg Oral Daily  . clopidogrel  75 mg Oral Q breakfast  . gabapentin  300 mg Oral BID  . insulin aspart  0-5 Units Subcutaneous QHS  . insulin aspart  0-9 Units Subcutaneous TID WC  . isosorbide mononitrate  120 mg Oral Daily  . levothyroxine  25 mcg Oral Q0600  . lipase/protease/amylase  72,000 Units Oral TID WC  .  losartan  25 mg Oral Daily  . metoprolol succinate  50 mg Oral Q breakfast  . multivitamin with minerals  1 tablet Oral Daily  . pantoprazole  40 mg Oral BID  . pyridostigmine  60 mg Oral TID  . rosuvastatin  10 mg Oral q1800  . sodium chloride flush  3 mL Intravenous Q12H  . vitamin B-12  1,000 mcg Oral Daily   Infusions:  . sodium chloride    . sodium chloride    . heparin 1,000 Units/hr (12/20/17 0007)   PRN: sodium chloride, acetaminophen **OR** acetaminophen, ALPRAZolam, hydrALAZINE, lipase/protease/amylase, morphine injection, nitroGLYCERIN, ondansetron **OR** ondansetron (ZOFRAN) IV, oxyCODONE, sodium chloride flush  Assessment: Patient presented to ER with left-sided chest which went down the left arm. Pharmacy consulted to start heparin.   11/17 Infusion started at 950 units/hr  11/17 HL 0.30 -Infusion increased to 1000 units/hr  Goal of Therapy:  Heparin level 0.3-0.7 units/ml Monitor platelets by anticoagulation protocol: Yes   Plan:  11/18 @ 0632 HL: 0.31. Level is therapeutic x 2. Will continue heparin infusion @ 1000 units/hr. Recheck HL and CBC with AM labs per protocol.  Continue to monitor H&H and  platelets  Pernell Dupre, PharmD, BCPS Clinical Pharmacist 12/20/2017 6:32 AM

## 2017-12-20 NOTE — Progress Notes (Signed)
[name] [place] NAME: [name]    MR#:  [id number]  DATE OF BIRTH:  Sep 07, 1970  SUBJECTIVE:  CHIEF COMPLAINT:   Chief Complaint  Patient presents with  . Chest Pain  Patient seen and evaluated today No new episodes of chest pain Due for cardiac cath today No complaints of shortness of breath  REVIEW OF SYSTEMS:    ROS  CONSTITUTIONAL: No documented fever. No fatigue, weakness. No weight gain, no weight loss.  EYES: No blurry or double vision.  ENT: No tinnitus. No postnasal drip. No redness of the oropharynx.  RESPIRATORY: No cough, no wheeze, no hemoptysis. No dyspnea.  CARDIOVASCULAR: No chest pain. No orthopnea. No palpitations. No syncope.  GASTROINTESTINAL: No nausea, no vomiting or diarrhea. No abdominal pain. No melena or hematochezia.  GENITOURINARY: No dysuria or hematuria.  ENDOCRINE: No polyuria or nocturia. No heat or cold intolerance.  HEMATOLOGY: No anemia. No bruising. No bleeding.  INTEGUMENTARY: No rashes. No lesions.  MUSCULOSKELETAL: No arthritis. No swelling. No gout.  NEUROLOGIC: No numbness, tingling, or ataxia. No seizure-type activity.  PSYCHIATRIC: No anxiety. No insomnia. No ADD.   DRUG ALLERGIES:   Allergies  Allergen Reactions  . Lipitor [Atorvastatin] Other (See Comments)    Leg pains  . Tramadol Other (See Comments)    Mouth feels like it's on fire    VITALS:  Blood pressure 119/63, pulse 93, temperature 98.3 F (36.8 C), temperature source Oral, resp. rate 16, height 5\' 2"  (1.575 m), weight 77.3 kg, SpO2 94 %.  PHYSICAL EXAMINATION:   Physical Exam  GENERAL:  47 y.o.-year-old patient lying in the bed with no acute distress.  EYES: Pupils equal, round, reactive to light and accommodation. No scleral icterus. Extraocular muscles intact.  HEENT: Head atraumatic, normocephalic. Oropharynx and nasopharynx clear.  NECK:  Supple, no jugular venous distention. No thyroid enlargement, no  tenderness.  LUNGS: Normal breath sounds bilaterally, no wheezing, rales, rhonchi. No use of accessory muscles of respiration.  CARDIOVASCULAR: S1, S2 normal. No murmurs, rubs, or gallops.  ABDOMEN: Soft, nontender, nondistended. Bowel sounds present. No organomegaly or mass.  EXTREMITIES: No cyanosis, clubbing or edema b/l.    NEUROLOGIC: Cranial nerves II through XII are intact. No focal Motor or sensory deficits b/l.   PSYCHIATRIC: The patient is alert and oriented x 3.  SKIN: No obvious rash, lesion, or ulcer.   LABORATORY PANEL:   CBC Recent Labs  Lab 12/20/17 0539  WBC 8.8  HGB 12.4  HCT 38.9  PLT 158   ------------------------------------------------------------------------------------------------------------------ Chemistries  Recent Labs  Lab 12/20/17 0539  NA 138  K 3.5  CL 105  CO2 28  GLUCOSE 130*  BUN 17  CREATININE 1.65*  CALCIUM 8.5*   ------------------------------------------------------------------------------------------------------------------  Cardiac Enzymes Recent Labs  Lab 12/20/17 0539  TROPONINI <0.03   ------------------------------------------------------------------------------------------------------------------  RADIOLOGY:  Dg Chest 1 View  Result Date: 12/19/2017 CLINICAL DATA:  Chest pain. EXAM: CHEST  1 VIEW COMPARISON:  Chest x-ray dated December 17, 2017. FINDINGS: The heart size and mediastinal contours are within normal limits. Unchanged right-sided aortic arch. Normal pulmonary vascularity. No focal consolidation, pleural effusion, or pneumothorax. No acute osseous abnormality. IMPRESSION: No active disease. Electronically Signed   By: Titus Dubin M.D.   On: 12/19/2017 14:19     ASSESSMENT AND PLAN:   47 year old female patient with history of coronary disease, PCI in the past, CKD stage III, CVA, hypertension, diabetes mellitus type 2, GERD currently  under hospitalist service for chest pain  -Unstable angina Continue  aspirin, Plavix, Imdur and statin Cardiac catheterization Cardiology follow-up  -Hypothyroidism Continue Synthroid  -Hypertension Continue Norvasc, Imdur metoprolol and losartan IV hydralazine as needed for better control blood pressure  -DVT prophylaxis After cardiac catheterization discontinue heparin drip Start patient on Lovenox subcutaneously  All the records are reviewed and case discussed with Care Management/Social Worker. Management plans discussed with the patient, family and they are in agreement.  CODE STATUS: Full code  DVT Prophylaxis: SCDs  TOTAL TIME TAKING CARE OF THIS PATIENT: 35 minutes.   POSSIBLE D/C IN 1 DAYS, DEPENDING ON CLINICAL CONDITION.  Saundra Shelling M.D on 12/20/2017 at 2:47 PM  Between 7am to 6pm - Pager - [phone]  After 6pm go to [email or web address] - password [email or web address] [facility]  [employer] [place] [facility]  Office  [phone]  CC: Primary care physician; Kendra Sons, MD  Note: This dictation was prepared with Dragon dictation along with smaller phrase technology. Any transcriptional errors that result from this process are unintentional.

## 2017-12-21 ENCOUNTER — Telehealth: Payer: Self-pay | Admitting: *Deleted

## 2017-12-21 DIAGNOSIS — N183 Chronic kidney disease, stage 3 (moderate): Secondary | ICD-10-CM

## 2017-12-21 DIAGNOSIS — I2510 Atherosclerotic heart disease of native coronary artery without angina pectoris: Secondary | ICD-10-CM | POA: Diagnosis not present

## 2017-12-21 DIAGNOSIS — E785 Hyperlipidemia, unspecified: Secondary | ICD-10-CM

## 2017-12-21 DIAGNOSIS — I10 Essential (primary) hypertension: Secondary | ICD-10-CM | POA: Diagnosis not present

## 2017-12-21 DIAGNOSIS — E039 Hypothyroidism, unspecified: Secondary | ICD-10-CM | POA: Diagnosis not present

## 2017-12-21 DIAGNOSIS — I25118 Atherosclerotic heart disease of native coronary artery with other forms of angina pectoris: Secondary | ICD-10-CM | POA: Diagnosis not present

## 2017-12-21 DIAGNOSIS — I200 Unstable angina: Secondary | ICD-10-CM | POA: Diagnosis not present

## 2017-12-21 LAB — CBC
HEMATOCRIT: 34.3 % — AB (ref 36.0–46.0)
Hemoglobin: 10.9 g/dL — ABNORMAL LOW (ref 12.0–15.0)
MCH: 26 pg (ref 26.0–34.0)
MCHC: 31.8 g/dL (ref 30.0–36.0)
MCV: 81.7 fL (ref 80.0–100.0)
NRBC: 0 % (ref 0.0–0.2)
PLATELETS: 139 10*3/uL — AB (ref 150–400)
RBC: 4.2 MIL/uL (ref 3.87–5.11)
RDW: 14.1 % (ref 11.5–15.5)
WBC: 8.8 10*3/uL (ref 4.0–10.5)

## 2017-12-21 LAB — GLUCOSE, CAPILLARY
Glucose-Capillary: 72 mg/dL (ref 70–99)
Glucose-Capillary: 134 mg/dL — ABNORMAL HIGH (ref 70–99)

## 2017-12-21 NOTE — Progress Notes (Signed)
Patient discharged home with family friend, VSS, no complaints of chest pain this shift. Patient educated on radial site care, and medication regimen for discharge. She has also been provided with follow up appointment information and seen by Dr End just prior to discharge.

## 2017-12-21 NOTE — Plan of Care (Signed)
  Problem: Nutrition: Goal: Adequate nutrition will be maintained Outcome: Progressing Note:  Pt nauseated once, treated with zofran with relief   Problem: Coping: Goal: Level of anxiety will decrease Outcome: Progressing Note:  Treated with xanax once before bed   Problem: Elimination: Goal: Will not experience complications related to bowel motility Outcome: Progressing Goal: Will not experience complications related to urinary retention Outcome: Progressing   Problem: Pain Managment: Goal: General experience of comfort will improve Outcome: Progressing Note:  Treated once for chest pain with oxycodone, with relief   Problem: Skin Integrity: Goal: Risk for impaired skin integrity will decrease Outcome: Progressing   Problem: Cardiovascular: Goal: Vascular access site(s) Level 0-1 will be maintained Outcome: Progressing Note:  Right radial cath site at a level zero, clean dry & intact    Problem: Education: Goal: Knowledge of General Education information will improve Description Including pain rating scale, medication(s)/side effects and non-pharmacologic comfort measures Outcome: Completed/Met   Problem: Activity: Goal: Risk for activity intolerance will decrease Outcome: Completed/Met Note:  Up in room independently, tolerating well   Problem: Clinical Measurements: Goal: Will remain free from infection Outcome: Not Applicable Goal: Respiratory complications will improve Outcome: Not Applicable   Problem: Safety: Goal: Ability to remain free from injury will improve Outcome: Not Applicable

## 2017-12-21 NOTE — Plan of Care (Signed)
  Problem: Clinical Measurements: Goal: Ability to maintain clinical measurements within normal limits will improve Outcome: Adequate for Discharge   Problem: Clinical Measurements: Goal: Cardiovascular complication will be avoided Outcome: Adequate for Discharge   Problem: Pain Managment: Goal: General experience of comfort will improve Outcome: Adequate for Discharge

## 2017-12-21 NOTE — Progress Notes (Signed)
Advanced care plan.  Purpose of the Encounter: CODE STATUS  Parties in Attendance: Patient  Patient's Decision Capacity: Good  Subjective/Patient's story: Presented to emergency room for chest pain   Objective/Medical story Has history of coronary disease and needs cardiology evaluation and cath   Goals of care determination:  Advance care directives and goals of care discussed Patient wants everything done which includes CPR, intubation and ventilator of the need arises   CODE STATUS: Full code   Time spent discussing advanced care planning: 16 minutes

## 2017-12-21 NOTE — Discharge Summary (Signed)
[place] [facility] [place] NAME: [name]    MR#:  [id number]  DATE OF BIRTH:  [birth year]  DATE OF ADMISSION:  12/19/2017 ADMITTING PHYSICIAN: Gladstone Lighter, MD  DATE OF DISCHARGE: 12/21/2017  PRIMARY CARE PHYSICIAN: [name], MD   ADMISSION DIAGNOSIS:  Angina at rest [facility]) [I20.8]  DISCHARGE DIAGNOSIS:  Active Problems:   Unstable angina ([place]) Hypothyroidism Hypertension Chronic kidney disease stage III Coronary artery disease History of CVA  SECONDARY DIAGNOSIS:   Past Medical History:  Diagnosis Date  . Anemia    iron deficiency anemia  . Aortic arch aneurysm ([place])   . Bipolar disorder ([place])   . BRCA negative 2014  . CAD (coronary artery disease)    a. 08/2003 Cath: LAD 30-40-med Rx; b. 11/2014 PCI: LAD 56m(3.25x23 Xience Alpine DES); c. 06/2015 PCI: D1 (2.25x12 Resolute Integrity DES); d. 06/2017 PCI: Patent mLAD stent, D2 95 (PTCA); e. 09/2017 Cath/PCI: LM nl, LAD 324m70d, D1 40, D2 99ost (CBA), LCX 40p, RCA 40ost/p.  . Marland KitchenKD (chronic kidney disease), stage III ([facility]Barnes  . Colon polyp   . CVA (cerebral vascular accident) ([facility]   Left side weakness.   . Diabetes ([facility]  . Family history of breast cancer    BRCA neg 2014  . Gastric ulcer 04/27/2011  . History of echocardiogram    a. 03/2017 Echo: EF 60-65%, no rwma.  . Marland KitchenTN (hypertension)   . Hyperlipemia   . Hypothyroid   . Malignant melanoma of skin of scalp ([facility]CS[facility]  . MI, acute, non ST segment elevation (HCOccidental  . Neuromuscular disorder (HCK[facility]om[facility]  . S/P drug eluting coronary stent placement 06/04/2015  . Sepsis (HCWindom2/14/2019     ADMITTING HISTORY Kendra StDemyah [name] a [age].o. female with a known history of CAD status post PCI, CKD stage III, history of stroke with minimal left-sided weakness, diabetes, hypertension, GERD, anemia of chronic disease presents to hospital secondary to worsening chest pain. Patient had a last cardiac catheterization in August 2019  and had angioplasty without stent placement at the time.  She has a diagonal branch that is occluded as she is being managed medically.  Last couple of weeks she is been having on and off chest pains.  Was here last month with chest pain and was discharged as troponins were negative.  Pain has been getting much worse in the last couple of days.  It sharp persistent mostly radiating down her left shoulder.  Has seen her cardiologist 3 weeks ago with similar complaints and also was in the ER 2 days ago with similar complaints and was discharged home as troponins were negative.  Last night she got diaphoretic with the pain and was very nauseous and short of breath the pain was persistent and so presented to the emergency room.  She was already taking nitroglycerin sublingual at home with minimal relief.  First troponin is negative.  She is being admitted for persistent unstable angina.  HOSPITAL COURSE:  Patient was admitted to telemetry.  Patient was anticoagulated with heparin drip.  Serial troponins were negative.  Patient was continued on aspirin, Plavix Imdur and statin medication.  Patient was evaluated by cardiology and had cardiac catheterization during the hospitalization.  Non-stenotic 1st Diag lesion was previously treated.  Ost 1st Diag lesion is 40% stenosed.  Mid LAD-2 lesion is 5% stenosed.  Ost 2nd Diag to 2nd Diag lesion is 95% stenosed.  Scoring balloon angioplasty balloon angioplasty was performed.  Dist LAD lesion is 80% stenosed.  Mid LAD-1 lesion is 30% stenosed.  Prox Cx lesion is 40% stenosed.  Ost RCA to Prox RCA lesion is 40% stenosed.   1.  Patent first diagonal and mid LAD stent with no significant restenosis.  Significant disease affecting the distal LAD close to the apex as well as recurrent restenosis in the ostial second diagonal which was treated in the past with angioplasty most recently in August with cutting balloon angioplasty. 2.  Normal left ventricular  end-diastolic pressure.  Left ventricular angiography was not performed.  Recommendations: The patient might have anginal symptoms related to disease in the distal LAD as well as ostial second diagonal.  Unfortunately, the LAD disease is too distal to stent and the ostial diagonal has been treated twice with balloon angioplasty with recurrent restenosis.  This is not an optimal location to place a stent as it will pinch the mid LAD.  The only way to treat this with stent would be to perform bifurcation stenting including the mid LAD.  However, the mid LAD disease does not seem to be obstructive and I feel that the long-term risks outweigh the benefit. I recommend continuing medical therapy. CONSULTS OBTAINED:  Treatment Team:  Minna Merritts, MD  DRUG ALLERGIES:   Allergies  Allergen Reactions  . Lipitor [Atorvastatin] Other (See Comments)    Leg pains  . Tramadol Other (See Comments)    Mouth feels like it's on fire    DISCHARGE MEDICATIONS:   Allergies as of 12/21/2017      Reactions   Lipitor [atorvastatin] Other (See Comments)   Leg pains   Tramadol Other (See Comments)   Mouth feels like it's on fire      Medication List    STOP taking these medications   ranolazine 1000 MG SR tablet Commonly known as:  RANEXA     TAKE these medications   ALPRAZolam 1 MG tablet Commonly known as:  XANAX TAKE 1 TABLET (1 MG TOTAL) BY MOUTH DAILY AS NEEDED FOR ANXIETY.   amLODipine 5 MG tablet Commonly known as:  NORVASC Take 1 tablet (5 mg total) by mouth daily.   aspirin 81 MG chewable tablet Chew 1 tablet (81 mg total) by mouth daily. What changed:  how much to take   buPROPion 150 MG 24 hr tablet Commonly known as:  WELLBUTRIN XL TAKE 1 TABLET BY MOUTH DAILY   calcium-vitamin D 500-200 MG-UNIT tablet Commonly known as:  OSCAL WITH D Take 1 tablet by mouth 2 (two) times daily.   citalopram 40 MG tablet Commonly known as:  CELEXA Take 1 tablet (40 mg total) by mouth  daily.   clopidogrel 75 MG tablet Commonly known as:  PLAVIX Take 1 tablet (75 mg total) by mouth daily with breakfast.   gabapentin 300 MG capsule Commonly known as:  NEURONTIN TAKE 1 CAPSULE BY MOUTH TWICE A DAY   hyoscyamine 0.125 MG SL tablet Commonly known as:  LEVSIN SL Place 1 tablet (0.125 mg total) under the tongue every 6 (six) hours as needed. What changed:  reasons to take this   isosorbide mononitrate 120 MG 24 hr tablet Commonly known as:  IMDUR Take 1 tablet (120 mg total) by mouth daily.   levothyroxine 25 MCG tablet Commonly known as:  SYNTHROID, LEVOTHROID Take 1 tablet (25 mcg total) daily before breakfast by mouth.   lipase/protease/amylase 36000 UNITS Cpep capsule Commonly known as:  CREON  Take 36,000-72,000 Units by mouth See admin instructions. Take 2 capsules (72,000u) three times daily with first bite of meals and 1 capsule (36,000u) daily with first bite of snacks   losartan 25 MG tablet Commonly known as:  COZAAR Take 1/2 tablet (12.5 mg) by mouth once daily What changed:    how much to take  how to take this  when to take this   metoprolol succinate 50 MG 24 hr tablet Commonly known as:  TOPROL-XL Take 1 tablet (50 mg total) by mouth daily. Take with or immediately following a meal.   multivitamin with minerals Tabs tablet Take 1 tablet by mouth daily.   nitroGLYCERIN 0.4 MG SL tablet Commonly known as:  NITROSTAT Place 1 tablet (0.4 mg total) under the tongue every 5 (five) minutes as needed for chest pain.   ONE TOUCH ULTRA 2 w/Device Kit Use to check blood sugar once a day. Dx. E11.9   pantoprazole 40 MG tablet Commonly known as:  PROTONIX Take 1 tablet (40 mg total) by mouth 2 (two) times daily.   promethazine 25 MG tablet Commonly known as:  PHENERGAN Take 1 tablet (25 mg total) by mouth every 6 (six) hours as needed for nausea or vomiting.   pyridostigmine 60 MG tablet Commonly known as:  MESTINON Take 60 mg by mouth 3  (three) times daily.   rosuvastatin 10 MG tablet Commonly known as:  CRESTOR Take 1 tablet (10 mg total) by mouth daily.   VITAMIN B 12 PO Take 1,000 mcg by mouth daily.       Today  Patient seen and evaluated today No complaints of shortness of breath No new episodes of chest pain Cardiology advised medical management to continue Patient hemodynamically stable will be discharged home VITAL SIGNS:  Blood pressure 132/64, pulse (!) 53, temperature 97.9 F (36.6 C), temperature source Oral, resp. rate 18, height _0  (1.575 m), weight 77.3 kg, SpO2 98 %.  I/O:    Intake/Output Summary (Last 24 hours) at 12/21/2017 1030 Last data filed at 12/21/2017 6144 Gross per 24 hour  Intake 676.65 ml  Output 2150 ml  Net -1473.35 ml    PHYSICAL EXAMINATION:  Physical Exam  GENERAL:  47 y.o.-year-old patient lying in the bed with no acute distress.  LUNGS: Normal breath sounds bilaterally, no wheezing, rales,rhonchi or crepitation. No use of accessory muscles of respiration.  CARDIOVASCULAR: S1, S2 normal. No murmurs, rubs, or gallops.  ABDOMEN: Soft, non-tender, non-distended. Bowel sounds present. No organomegaly or mass.  NEUROLOGIC: Moves all 4 extremities. PSYCHIATRIC: The patient is alert and oriented x 3.  SKIN: No obvious rash, lesion, or ulcer.   DATA REVIEW:   CBC Recent Labs  Lab 12/21/17 0406  WBC 8.8  HGB 10.9*  HCT 34.3*  PLT 139*    Chemistries  Recent Labs  Lab 12/20/17 0539  NA 138  K 3.5  CL 105  CO2 28  GLUCOSE 130*  BUN 17  CREATININE 1.65*  CALCIUM 8.5*    Cardiac Enzymes Recent Labs  Lab 12/20/17 0539  TROPONINI <0.03    Microbiology Results  Results for orders placed or performed during the hospital encounter of 11/17/17  Gastrointestinal Panel by PCR , Stool     Status: None   Collection Time: 11/17/17 11:59 AM  Result Value Ref Range Status   Campylobacter species NOT DETECTED NOT DETECTED Final   Plesimonas shigelloides  NOT DETECTED NOT DETECTED Final   Salmonella species NOT DETECTED NOT DETECTED Final  Yersinia enterocolitica NOT DETECTED NOT DETECTED Final   Vibrio species NOT DETECTED NOT DETECTED Final   Vibrio cholerae NOT DETECTED NOT DETECTED Final   Enteroaggregative E coli (EAEC) NOT DETECTED NOT DETECTED Final   Enteropathogenic E coli (EPEC) NOT DETECTED NOT DETECTED Final   Enterotoxigenic E coli (ETEC) NOT DETECTED NOT DETECTED Final   Shiga like toxin producing E coli (STEC) NOT DETECTED NOT DETECTED Final   Shigella/Enteroinvasive E coli (EIEC) NOT DETECTED NOT DETECTED Final   Cryptosporidium NOT DETECTED NOT DETECTED Final   Cyclospora cayetanensis NOT DETECTED NOT DETECTED Final   Entamoeba histolytica NOT DETECTED NOT DETECTED Final   Giardia lamblia NOT DETECTED NOT DETECTED Final   Adenovirus F40/41 NOT DETECTED NOT DETECTED Final   Astrovirus NOT DETECTED NOT DETECTED Final   Norovirus GI/GII NOT DETECTED NOT DETECTED Final   Rotavirus A NOT DETECTED NOT DETECTED Final   Sapovirus (I, II, IV, and V) NOT DETECTED NOT DETECTED Final    Comment: Performed at [facility], [address] [place], [place] [address]  C difficile quick scan w PCR reflex     Status: None   Collection Time: 11/17/17 11:59 AM  Result Value Ref Range Status   C Diff antigen NEGATIVE NEGATIVE Final   C Diff toxin NEGATIVE NEGATIVE Final   C Diff interpretation No C. difficile detected.  Final    Comment: Performed at [facility], [address] [place], [place] [address]  Calprotectin, Fecal     Status: None   Collection Time: 11/17/17 11:59 AM  Result Value Ref Range Status   Calprotectin, Fecal 83 0 - 120 ug/g Final    Comment: (NOTE) Concentration     Interpretation   Follow-Up <16 - 50 ug/g     Normal           None >50 -120 ug/g     Borderline       Re-evaluate in 4-6 weeks    >120 ug/g     Abnormal         Repeat as clinically                                    indicated Performed At: [facility] [place], [place] [id number] Rush Farmer MD LD:[id number]     RADIOLOGY:  Dg Chest 1 View  Result Date: 12/19/2017 CLINICAL DATA:  Chest pain. EXAM: CHEST  1 VIEW COMPARISON:  Chest x-ray dated December 17, 2017. FINDINGS: The heart size and mediastinal contours are within normal limits. Unchanged right-sided aortic arch. Normal pulmonary vascularity. No focal consolidation, pleural effusion, or pneumothorax. No acute osseous abnormality. IMPRESSION: No active disease. Electronically Signed   By: Titus Dubin M.D.   On: 12/19/2017 14:19    Follow up with PCP in 1 week.  Management plans discussed with the patient, family and they are in agreement.  CODE STATUS:     Code Status Orders  (From admission, onward)         Start     Ordered   12/19/17 1625  Full code  Continuous     12/19/17 1624        Code Status History    Date Active Date Inactive Code Status Order ID Comments User Context   09/19/2017 1657 09/21/2017 1519 Full Code [id number]  Loletha Grayer, MD ED   06/29/2017 1241 06/30/2017 1523 Full Code [id number]  Wellington Hampshire, MD Inpatient   04/09/2017 1448 04/13/2017 1738 DNR [id number]  Nicholes Mango, MD Inpatient   03/18/2017 2348 03/22/2017 1844 Full Code [id number]  Henreitta Leber, MD ED   03/16/2017 1539 03/16/2017 1937 Full Code [id number]  Poggi, Marshall Cork, MD Inpatient   01/27/2017 1934 01/29/2017 1839 Full Code [id number]  Salary, Avel Peace, MD Inpatient   01/07/2016 1630 01/07/2016 2016 Full Code [id number]  Corky Mull, MD Inpatient   12/14/2015 0239 12/14/2015 1733 Full Code [id number]  Harrie Foreman, MD ED   11/07/2015 1521 11/08/2015 1920 Full Code [id number]  Baxter Hire, MD Inpatient   10/28/2015 1604 11/02/2015 1504 Full Code [id number]  Henreitta Leber, MD Inpatient   06/03/2015 1510 06/05/2015 1501 Full Code [id number]  Fritzi Mandes, MD Inpatient   04/17/2015 2159 04/18/2015 2045 Full Code [id number]   Vaughan Basta, MD ED   02/15/2015 2004 02/20/2015 1903 Full Code [id number]  Gonzella Lex, MD Inpatient   11/12/2014 0833 11/13/2014 1449 Full Code [id number]  Isaias Cowman, MD Inpatient   11/07/2014 2153 11/12/2014 0833 Full Code [id number]  Henreitta Leber, MD Inpatient      TOTAL TIME TAKING CARE OF THIS PATIENT ON DAY OF DISCHARGE: more than 35 minutes.   Saundra Shelling M.D on 12/21/2017 at 10:30 AM  Between 7am to 6pm - Pager - [phone]  After 6pm go to [email or web address] - password [email or web address] [facility]  [facility] [place] [facility]  Office  [phone]  CC: Primary care physician; [name], MD  Note: This dictation was prepared with Dragon dictation along with smaller phrase technology. Any transcriptional errors that result from this process are unintentional.

## 2017-12-21 NOTE — Telephone Encounter (Signed)
-----   Message from Nelva Bush, MD sent at 12/21/2017  4:35 PM EST ----- Regarding: Post-Cath labs Good afternoon,  Would be possible to arrange for [name] to have a basic metabolic panel drawn early next week (Monday or Tuesday) for reassessment of her chronic kidney disease stage III following catheterization by Dr. Fletcher Anon yesterday?  Thanks.  Gerald Stabs

## 2017-12-21 NOTE — Progress Notes (Signed)
Progress Note  Patient Name: [name] Date of Encounter: 12/21/2017  Primary Cardiologist: Ida Rogue, MD   Subjective   Patient feels well this morning.  She had brief episode of chest pain yesterday shortly after catheterization, which has resolved.  No shortness of breath.  Tenderness at right radial arteriotomy site noted.  Inpatient Medications    Scheduled Meds:  Continuous Infusions:  PRN Meds:    Vital Signs    Vitals:   12/20/17 1500 12/20/17 1556 12/20/17 1940 12/21/17 0632  BP: 101/65 131/76 140/74 132/64  Pulse: 75 (!) 57 64 (!) 53  Resp: 14 18 20 18   Temp:  98.5 F (36.9 C) 97.7 F (36.5 C) 97.9 F (36.6 C)  TempSrc:  Oral Oral Oral  SpO2: 95% 97% 96% 98%  Weight:      Height:        Intake/Output Summary (Last 24 hours) at 12/21/2017 1627 Last data filed at 12/21/2017 8676 Gross per 24 hour  Intake 676.65 ml  Output 1150 ml  Net -473.35 ml   Filed Weights   12/19/17 1624 12/20/17 0245 12/20/17 1222  Weight: 76.8 kg 77.3 kg 77.3 kg    Telemetry    Normal sinus rhythm and sinus bradycardia, heart rate 49 to 100 bpm - Personally Reviewed  ECG    No new tracing- Personally Reviewed  Physical Exam   GEN: No acute distress.   Neck: No JVD Cardiac: RRR, no murmurs, rubs, or gallops.  Right radial arteriotomy with minimal bruising.  2+ right radial pulse.  No bruit. Respiratory: Clear to auscultation bilaterally. GI: Soft, nontender, non-distended  MS: No edema; No deformity. Neuro:  Nonfocal  Psych: Normal affect   Labs    Chemistry Recent Labs  Lab 12/17/17 1046 12/19/17 1113 12/20/17 0539  NA 138 136 138  K 4.3 3.8 3.5  CL 110 107 105  CO2 21* 22 28  GLUCOSE 140* 320* 130*  BUN 13 19 17   CREATININE 1.58* 1.75* 1.65*  CALCIUM 8.8* 8.5* 8.5*  GFRNONAA 37* 33* 36*  GFRAA 43* 38* 41*  ANIONGAP 7 7 5      Hematology Recent Labs  Lab 12/19/17 1113 12/20/17 0539 12/21/17 0406  WBC 6.7 8.8 8.8  RBC 4.64  4.77 4.20  HGB 12.2 12.4 10.9*  HCT 38.2 38.9 34.3*  MCV 82.3 81.6 81.7  MCH 26.3 26.0 26.0  MCHC 31.9 31.9 31.8  RDW 14.2 13.8 14.1  PLT 148* 158 139*    Cardiac Enzymes Recent Labs  Lab 12/19/17 1113 12/19/17 1727 12/19/17 2235 12/20/17 0539  TROPONINI <0.03 <0.03 <0.03 <0.03   No results for input(s): TROPIPOC in the last 168 hours.   BNPNo results for input(s): BNP, PROBNP in the last 168 hours.   DDimer No results for input(s): DDIMER in the last 168 hours.   Radiology    No results found.  Cardiac Studies   LHC (12/20/2017): 1.  Patent first diagonal and mid LAD stent with no significant restenosis.  Significant disease affecting the distal LAD close to the apex as well as recurrent restenosis in the ostial second diagonal which was treated in the past with angioplasty most recently in August with cutting balloon angioplasty. 2.  Normal left ventricular Jeovani Weisenburger-diastolic pressure.  Left ventricular angiography was not performed.  Diagnostic Diagram        Patient Profile     47 y.o. female with coronary artery disease status post multiple PCI's, HTN, HLD, type 2 diabetes mellitus,  CKD III, prior strokes with left carotid to subclavian bypass, admitted with unstable angina.  Assessment & Plan    Unstable angina Chest pain improved following catheterization yesterday.  This showed severe restenosis involving second diagonal branch, which has previously been angioplastied on more than one occasion.  She also has severe diffuse distal/apical LAD disease.  Given the location of her lesions, intervention was deferred in favor of medical optimization.  Continue dual antiplatelet therapy with aspirin and clopidogrel.  Continue antianginal therapy with amlodipine, metoprolol, and isosorbide mononitrate.  If chest pain continues, complex PCI to D2 may need to be considered.  Based on its ostial location and angle of takeoff, I suspect that a 2 stent bifurcation strategy  would need to be employed.  Hyperlipidemia LDL well controlled on last check in August.  Continue rosuvastatin.  Chronic kidney disease stage III Creatinine at baseline yesterday leading up to catheterization.  No renal function assessment performed today.  Plan for outpatient BMP within a week, given contrast exposure yesterday.  Disposition: Patient stable for discharge home today.  She should have a BMP early next week and follow-up with Dr. [name] or an APP in 1 to 2 weeks.  For questions or updates, please contact [place] Please consult [email or web address] for contact info under [facility].     Signed, Nelva Bush, MD  12/21/2017, 4:27 PM

## 2017-12-22 ENCOUNTER — Telehealth: Payer: Self-pay | Admitting: Cardiovascular Disease

## 2017-12-22 NOTE — Telephone Encounter (Signed)
New Message   Pt states she would like to be released from [facility] and request to see Dr. Ellyn Hack. Please advise

## 2017-12-22 NOTE — Telephone Encounter (Signed)
Does she realize that I am no longer @ [place] office?

## 2017-12-22 NOTE — Telephone Encounter (Signed)
I spoke with the patient to arrange for a repeat BMP on Monday/ Tuesday next week. Per the patient, she is scheduled to see Dr. Holley Raring (nephrology) on 12/27/17 and he will be rechecking her labs that day.  To Dr. Saunders Revel as an Juluis Rainier.

## 2017-12-22 NOTE — Telephone Encounter (Signed)
Ok.  That is fine.  Thanks.  Nelva Bush, MD [facility] Pager: [phone]

## 2017-12-23 ENCOUNTER — Emergency Department: Payer: Medicare Other

## 2017-12-23 ENCOUNTER — Other Ambulatory Visit: Payer: Self-pay

## 2017-12-23 ENCOUNTER — Inpatient Hospital Stay: Payer: Self-pay | Admitting: Family Medicine

## 2017-12-23 ENCOUNTER — Emergency Department
Admission: EM | Admit: 2017-12-23 | Discharge: 2017-12-23 | Disposition: A | Discharge status: Home / Self Care | Payer: Medicare Other | Attending: Emergency Medicine | Admitting: Emergency Medicine

## 2017-12-23 DIAGNOSIS — E1122 Type 2 diabetes mellitus with diabetic chronic kidney disease: Secondary | ICD-10-CM | POA: Diagnosis not present

## 2017-12-23 DIAGNOSIS — E039 Hypothyroidism, unspecified: Secondary | ICD-10-CM | POA: Insufficient documentation

## 2017-12-23 DIAGNOSIS — I5022 Chronic systolic (congestive) heart failure: Secondary | ICD-10-CM | POA: Insufficient documentation

## 2017-12-23 DIAGNOSIS — Z79899 Other long term (current) drug therapy: Secondary | ICD-10-CM | POA: Insufficient documentation

## 2017-12-23 DIAGNOSIS — S0990XA Unspecified injury of head, initial encounter: Secondary | ICD-10-CM

## 2017-12-23 DIAGNOSIS — I2510 Atherosclerotic heart disease of native coronary artery without angina pectoris: Secondary | ICD-10-CM | POA: Diagnosis not present

## 2017-12-23 DIAGNOSIS — R55 Syncope and collapse: Secondary | ICD-10-CM | POA: Diagnosis not present

## 2017-12-23 DIAGNOSIS — I130 Hypertensive heart and chronic kidney disease with heart failure and stage 1 through stage 4 chronic kidney disease, or unspecified chronic kidney disease: Secondary | ICD-10-CM | POA: Diagnosis not present

## 2017-12-23 DIAGNOSIS — R51 Headache: Secondary | ICD-10-CM | POA: Insufficient documentation

## 2017-12-23 DIAGNOSIS — Z7901 Long term (current) use of anticoagulants: Secondary | ICD-10-CM | POA: Insufficient documentation

## 2017-12-23 DIAGNOSIS — R079 Chest pain, unspecified: Secondary | ICD-10-CM | POA: Diagnosis not present

## 2017-12-23 DIAGNOSIS — Z87891 Personal history of nicotine dependence: Secondary | ICD-10-CM | POA: Insufficient documentation

## 2017-12-23 DIAGNOSIS — N183 Chronic kidney disease, stage 3 (moderate): Secondary | ICD-10-CM | POA: Insufficient documentation

## 2017-12-23 DIAGNOSIS — R296 Repeated falls: Secondary | ICD-10-CM | POA: Diagnosis not present

## 2017-12-23 LAB — CBC
HEMATOCRIT: 36.2 % (ref 36.0–46.0)
Hemoglobin: 11.5 g/dL — ABNORMAL LOW (ref 12.0–15.0)
MCH: 26.4 pg (ref 26.0–34.0)
MCHC: 31.8 g/dL (ref 30.0–36.0)
MCV: 83 fL (ref 80.0–100.0)
PLATELETS: 168 10*3/uL (ref 150–400)
RBC: 4.36 MIL/uL (ref 3.87–5.11)
RDW: 14 % (ref 11.5–15.5)
WBC: 10.4 10*3/uL (ref 4.0–10.5)
nRBC: 0 % (ref 0.0–0.2)

## 2017-12-23 LAB — COMPREHENSIVE METABOLIC PANEL
ALT: 25 U/L (ref 0–44)
AST: 19 U/L (ref 15–41)
Albumin: 3.7 g/dL (ref 3.5–5.0)
Alkaline Phosphatase: 141 U/L — ABNORMAL HIGH (ref 38–126)
Anion gap: 5 (ref 5–15)
BILIRUBIN TOTAL: 0.3 mg/dL (ref 0.3–1.2)
BUN: 19 mg/dL (ref 6–20)
CALCIUM: 8.5 mg/dL — AB (ref 8.9–10.3)
CHLORIDE: 109 mmol/L (ref 98–111)
CO2: 26 mmol/L (ref 22–32)
Creatinine, Ser: 1.84 mg/dL — ABNORMAL HIGH (ref 0.44–1.00)
GFR, EST AFRICAN AMERICAN: 36 mL/min — AB (ref >60)
GFR, EST NON AFRICAN AMERICAN: 31 mL/min — AB (ref >60)
Glucose, Bld: 99 mg/dL (ref 70–99)
Potassium: 4.2 mmol/L (ref 3.5–5.1)
Sodium: 140 mmol/L (ref 135–145)
TOTAL PROTEIN: 6.5 g/dL (ref 6.5–8.1)

## 2017-12-23 LAB — TROPONIN I

## 2017-12-23 MED ORDER — MORPHINE SULFATE (PF) 4 MG/ML IV SOLN
4.0000 mg | Freq: Once | INTRAVENOUS | Status: AC
  Administered 2017-12-23: 4 mg via INTRAVENOUS
  Filled 2017-12-23: qty 1

## 2017-12-23 MED ORDER — SODIUM CHLORIDE 0.9 % IV BOLUS
1000.0000 mL | Freq: Once | INTRAVENOUS | Status: AC
  Administered 2017-12-23: 1000 mL via INTRAVENOUS

## 2017-12-23 NOTE — ED Notes (Signed)
Pt to ct at this time.

## 2017-12-23 NOTE — Progress Notes (Deleted)
Patient: [name] Female    DOB: [birth year]   47 y.o.   MRN: [id number] Visit Date: 12/23/2017  Today's Provider: [name], MD   No chief complaint on file.  Subjective:    HPI     Follow up Hospitalization  Patient was admitted to [facility] on 12/19/17 and discharged on 12/21/17. She was treated for Angia. Treatment for this included, cardiac catheterization. Ranolazine was discontinued Telephone follow up was done on 12/21/17 [place] She reports {DESC; EXCELLENT/GOOD/FAIR:19992} compliance with treatment. She reports this condition is {improved/worse/unchanged:3041574}.  ------------------------------------------------------------------------------------     Allergies  Allergen Reactions  . Lipitor [Atorvastatin] Other (See Comments)    Leg pains  . Tramadol Other (See Comments)    Mouth feels like it's on fire     Current Outpatient Medications:  .  ALPRAZolam (XANAX) 1 MG tablet, TAKE 1 TABLET (1 MG TOTAL) BY MOUTH DAILY AS NEEDED FOR ANXIETY., Disp: 30 tablet, Rfl: 5 .  amLODipine (NORVASC) 5 MG tablet, Take 1 tablet (5 mg total) by mouth daily., Disp: 90 tablet, Rfl: 6 .  aspirin 81 MG chewable tablet, Chew 1 tablet (81 mg total) by mouth daily. (Patient taking differently: Chew 324 mg by mouth daily. ), Disp: 30 tablet, Rfl: 11 .  Blood Glucose Monitoring Suppl (ONE TOUCH ULTRA 2) w/Device KIT, Use to check blood sugar once a day. Dx. E11.9, Disp: 1 each, Rfl: 0 .  buPROPion (WELLBUTRIN XL) 150 MG 24 hr tablet, TAKE 1 TABLET BY MOUTH DAILY, Disp: 30 tablet, Rfl: 11 .  calcium-vitamin D (OSCAL 500/200 D-3) 500-200 MG-UNIT tablet, Take 1 tablet by mouth 2 (two) times daily., Disp: 60 tablet, Rfl: 0 .  citalopram (CELEXA) 40 MG tablet, Take 1 tablet (40 mg total) by mouth daily., Disp: 30 tablet, Rfl: 5 .  clopidogrel (PLAVIX) 75 MG tablet, Take 1 tablet (75 mg total) by mouth daily with breakfast., Disp: 30 tablet, Rfl: 2 .  Cyanocobalamin  (VITAMIN B 12 PO), Take 1,000 mcg by mouth daily. , Disp: , Rfl:  .  gabapentin (NEURONTIN) 300 MG capsule, TAKE 1 CAPSULE BY MOUTH TWICE A DAY (Patient taking differently: Take 300 mg by mouth 2 (two) times daily. ), Disp: 60 capsule, Rfl: 5 .  hyoscyamine (LEVSIN SL) 0.125 MG SL tablet, Place 1 tablet (0.125 mg total) under the tongue every 6 (six) hours as needed. (Patient taking differently: Place 0.125 mg under the tongue every 6 (six) hours as needed for cramping. ), Disp: 60 tablet, Rfl: 0 .  isosorbide mononitrate (IMDUR) 120 MG 24 hr tablet, Take 1 tablet (120 mg total) by mouth daily., Disp: 90 tablet, Rfl: 1 .  levothyroxine (SYNTHROID, LEVOTHROID) 25 MCG tablet, Take 1 tablet (25 mcg total) daily before breakfast by mouth., Disp: 30 tablet, Rfl: 12 .  lipase/protease/amylase (CREON) 36000 UNITS CPEP capsule, Take 36,644-03,474 Units by mouth See admin instructions. Take 2 capsules (72,000u) three times daily with first bite of meals and 1 capsule (36,000u) daily with first bite of snacks, Disp: , Rfl:  .  losartan (COZAAR) 25 MG tablet, Take 1/2 tablet (12.5 mg) by mouth once daily (Patient taking differently: Take 25 mg by mouth daily. Take 1/2 tablet (12.5 mg) by mouth once daily), Disp: 45 tablet, Rfl: 1 .  metoprolol succinate (TOPROL-XL) 50 MG 24 hr tablet, Take 1 tablet (50 mg total) by mouth daily. Take with or immediately following a meal., Disp: 30 tablet, Rfl: 3 .  Multiple Vitamin (MULTIVITAMIN WITH MINERALS) TABS tablet, Take 1 tablet by mouth daily., Disp: , Rfl:  .  nitroGLYCERIN (NITROSTAT) 0.4 MG SL tablet, Place 1 tablet (0.4 mg total) under the tongue every 5 (five) minutes as needed for chest pain., Disp: 25 tablet, Rfl: 2 .  pantoprazole (PROTONIX) 40 MG tablet, Take 1 tablet (40 mg total) by mouth 2 (two) times daily., Disp: 60 tablet, Rfl: 5 .  promethazine (PHENERGAN) 25 MG tablet, Take 1 tablet (25 mg total) by mouth every 6 (six) hours as needed for nausea or  vomiting., Disp: 30 tablet, Rfl: 3 .  pyridostigmine (MESTINON) 60 MG tablet, Take 60 mg by mouth 3 (three) times daily. , Disp: , Rfl:  .  rosuvastatin (CRESTOR) 10 MG tablet, Take 1 tablet (10 mg total) by mouth daily., Disp: 30 tablet, Rfl: 11  Review of Systems  Social History   Tobacco Use  . Smoking status: Former Smoker    Types: Cigarettes    Last attempt to quit: 08/31/1994    Years since quitting: 23.3  . Smokeless tobacco: Never Used  . Tobacco comment: quit 28 years ago  Substance Use Topics  . Alcohol use: No    Alcohol/week: 0.0 standard drinks   Objective:   There were no vitals taken for this visit. There were no vitals filed for this visit.   Physical Exam      Assessment & Plan:           [name], MD  [facility]

## 2017-12-23 NOTE — ED Provider Notes (Signed)
[facility] Emergency Department Provider Note  Time seen: 9:47 PM  I have reviewed the triage vital signs and the nursing notes.   HISTORY  Chief Complaint Chest Pain; Near Syncope; and Fall    HPI [name] is a 47 y.o. female with a past medical history of CAD status post stents, CKD, CVA, hypertension, hyperlipidemia, chronic chest pain, presents to the emergency department for chest pain fall and a headache.  According to the patient she was having chest pain this morning, states she stood up to put more weight on the fire and must of passed out awoke on the ground with a bad headache believe she hit her head on the ground.  States she had return of chest pain around 5 PM tonight took a second nitroglycerin felt much better but wanted to come to the emergency department for evaluation.  Currently denies any chest pain shortness of breath.  States she was somewhat nauseated earlier but denies any diaphoresis at any point.  Patient states her main concern right now is a moderate headache.  Describes her chest pain is a dull aching pressure in the middle of her chest, states a history of cardiac disease and states chest pain on a near daily basis.  Was discharged from the hospital on Tuesday, states she has required nitroglycerin every day since discharge for chest pain.  Sees Dr. [name] and Dr. Fletcher Anon for cardiology.   Past Medical History:  Diagnosis Date  . Anemia    iron deficiency anemia  . Aortic arch aneurysm ([place])   . Bipolar disorder ([place])   . BRCA negative 2014  . CAD (coronary artery disease)    a. 08/2003 Cath: LAD 30-40-med Rx; b. 11/2014 PCI: LAD 51m(3.25x23 Xience Alpine DES); c. 06/2015 PCI: D1 (2.25x12 Resolute Integrity DES); d. 06/2017 PCI: Patent mLAD stent, D2 95 (PTCA); e. 09/2017 Cath/PCI: LM nl, LAD 335m70d, D1 40, D2 99ost (CBA), LCX 40p, RCA 40ost/p.  . Marland KitchenKD (chronic kidney disease), stage III (HCMonroeville  . Colon polyp   . CVA (cerebral  vascular accident) (HCComo   Left side weakness.   . Diabetes (HCChaum[place]  . Family history of breast cancer    BRCA neg 2014  . Gastric ulcer 04/27/2011  . History of echocardiogram    a. 03/2017 Echo: EF 60-65%, no rwma.  . Marland KitchenTN (hypertension)   . Hyperlipemia   . Hypothyroid   . Malignant melanoma of skin of scalp (HCWinfield  . MI, acute, non ST segment elevation (HCPartridge  . Neuromuscular disorder (HCPlymouth  . S/P drug eluting coronary stent placement 06/04/2015  . Sepsis (HCGreer2/14/2019    Patient Active Problem List   Diagnosis Date Noted  . MI, acute, non ST segment elevation (HCMono Vista  . Effort angina (HCMontgomery05/28/2019  . Unstable angina (HCLouisville05/23/2019  . Syncope 04/09/2017  . Insomnia 03/18/2017  . Ischemic cardiomyopathy   . Arthritis   . Anxiety   . Acute colitis 01/27/2017  . Hx of colonic polyps   . Bariatric surgery status   . Sinus tachycardia 02/27/2016  . H/O medication noncompliance 12/14/2015  . Acute pyelonephritis   . Emesis   . CKD (chronic kidney disease), stage IV (HCSouthern Pines  . Atherosclerosis of native coronary artery of native heart with stable angina pectoris (HCToughkenamon  . Hypertensive heart disease   . CKD (chronic kidney disease), stage III (HCNokomis05/04/2015  . Presence of  drug coated stent in LAD coronary artery & now in D1 06/04/2015  . Colitis 06/03/2015  . Carotid stenosis 04/30/2015  . Type 2 diabetes mellitus with stage 3 chronic kidney disease, without long-term current use of insulin ([address])   . Atypical chest pain   . Stable angina pectoris ([place]) 04/17/2015  . Iron deficiency anemia 03/22/2015  . Vitamin B12 deficiency 02/18/2015  . Misuse of medications for pain 02/18/2015  . Severe recurrent major depression without psychotic features ([place]) 02/15/2015  . Helicobacter pylori infection 11/23/2014  . Hemiparesis, left ([place]) 11/23/2014  . Benign neoplasm of colon 11/20/2014  . Malignant melanoma ([place]) 08/25/2014  . Chronic systolic CHF (congestive heart  failure) ([place])   . Incomplete bladder emptying 07/12/2014  . Adult hypothyroidism 12/30/2013  . Aberrant subclavian artery 11/17/2013  . Multiple sclerosis ([place]) 11/02/2013  . History of CVA (cerebrovascular accident) 06/20/2013  . Headache, migraine 05/29/2013  . Hyperlipidemia   . GERD (gastroesophageal reflux disease)   . Neuropathy ([place]) 01/02/2011  . CVA (cerebral vascular accident) ([place]) 06/21/2008  . Essential hypertension 05/01/2008    Past Surgical History:  Procedure Laterality Date  . APPENDECTOMY    . CARDIAC CATHETERIZATION N/A 11/09/2014   Procedure: Coronary Angiography;  Surgeon: Minna Merritts, MD;  Location: [place] CV [facility];  Service: Cardiovascular;  Laterality: N/A;  . CARDIAC CATHETERIZATION N/A 11/12/2014   Procedure: Coronary Stent Intervention;  Surgeon: Isaias Cowman, MD;  Location: [place] CV LAB;  Service: Cardiovascular;  Laterality: N/A;  . CARDIAC CATHETERIZATION N/A 04/18/2015   Procedure: Left Heart Cath and Coronary Angiography;  Surgeon: Minna Merritts, MD;  Location: [place] [facility];  Service: Cardiovascular;  Laterality: N/A;  . CARDIAC CATHETERIZATION Left 06/04/2015   Procedure: Left Heart Cath and Coronary Angiography;  Surgeon: Wellington Hampshire, MD;  Location: [place] CV LAB;  Service: Cardiovascular;  Laterality: Left;  . CARDIAC CATHETERIZATION N/A 06/04/2015   Procedure: Coronary Stent Intervention;  Surgeon: Wellington Hampshire, MD;  Location: [place] CV LAB;  Service: Cardiovascular;  Laterality: N/A;  . CESAREAN SECTION  2001  . CHOLECYSTECTOMY N/A 11/18/2016   Procedure: LAPAROSCOPIC CHOLECYSTECTOMY WITH INTRAOPERATIVE CHOLANGIOGRAM;  Surgeon: Christene Lye, MD;  Location: [facility] ORS;  Service: General;  Laterality: N/A;  . COLONOSCOPY WITH PROPOFOL N/A 04/27/2016   Procedure: COLONOSCOPY WITH PROPOFOL;  Surgeon: [name], MD;  Location: [place];  Service: Endoscopy;  Laterality: N/A;  .  CORONARY ANGIOPLASTY    . CORONARY BALLOON ANGIOPLASTY N/A 06/29/2017   Procedure: CORONARY BALLOON ANGIOPLASTY;  Surgeon: Wellington Hampshire, MD;  Location: [place] CV [facility];  Service: Cardiovascular;  Laterality: N/A;  . CORONARY BALLOON ANGIOPLASTY N/A 09/20/2017   Procedure: CORONARY BALLOON ANGIOPLASTY;  Surgeon: Wellington Hampshire, MD;  Location: [place] [facility];  Service: Cardiovascular;  Laterality: N/A;  . DILATION AND CURETTAGE OF UTERUS    . ESOPHAGOGASTRODUODENOSCOPY (EGD) WITH PROPOFOL N/A 09/14/2014   Procedure: ESOPHAGOGASTRODUODENOSCOPY (EGD) WITH PROPOFOL;  Surgeon: Josefine Class, MD;  Location[facility] END[facility]COPY;  Service: Endoscopy;  Laterality: N/A;  . ESOPHAGOGASTRODUODENOSCOPY (EGD) WITH PROPOFOL N/A 04/27/2016   Procedure: ESOPHAGOGASTRODUODENOSCOPY (EGD) WITH PROPOFOL;  Surgeon: [name], MD;  Location: [place];  Service: Endoscopy;  Laterality: N/A;  Diabetic - oral meds  . GASTRIC BYPASS  09/2009   [facility]   . Left Carotid to sublcavian artery bypass w/ subclavian artery ligation     a. Performed @ [place].  . LEFT  HEART CATH AND CORONARY ANGIOGRAPHY Left 06/29/2017   Procedure: LEFT HEART CATH AND CORONARY ANGIOGRAPHY;  Surgeon: Wellington Hampshire, MD;  Location: [place] [facility];  Service: Cardiovascular;  Laterality: Left;  . LEFT HEART CATH AND CORONARY ANGIOGRAPHY N/A 09/20/2017   Procedure: LEFT HEART CATH AND CORONARY ANGIOGRAPHY;  Surgeon: Wellington Hampshire, MD;  Location: [place] CV LAB;  Service: Cardiovascular;  Laterality: N/A;  . LEFT HEART CATH AND CORONARY ANGIOGRAPHY N/A 12/20/2017   Procedure: LEFT HEART CATH AND CORONARY ANGIOGRAPHY;  Surgeon: Wellington Hampshire, MD;  Location: [place] CV LAB;  Service: Cardiovascular;  Laterality: N/A;  . MELANOMA EXCISION  2016   Dr. Evorn Gong  . [place]  2002  . RIGHT OOPHORECTOMY    . SHOULDER ARTHROSCOPY WITH OPEN ROTATOR CUFF REPAIR Right 01/07/2016    Procedure: SHOULDER ARTHROSCOPY WITH DEBRIDMENT, SUBACHROMIAL DECOMPRESSION;  Surgeon: Corky Mull, MD;  Location: [facility];  Service: Orthopedics;  Laterality: Right;  . SHOULDER ARTHROSCOPY WITH OPEN ROTATOR CUFF REPAIR Right 03/16/2017   Procedure: SHOULDER ARTHROSCOPY WITH OPEN ROTATOR CUFF REPAIR POSSIBLE BICEPS TENODESIS;  Surgeon: Corky Mull, MD;  Location: [facility] ORS;  Service: Orthopedics;  Laterality: Right;  . TRIGGER FINGER RELEASE Right     [place]    Prior to Admission medications   Medication Sig Start Date End Date Taking? Authorizing Provider  ALPRAZolam (XANAX) 1 MG tablet TAKE 1 TABLET (1 MG TOTAL) BY MOUTH DAILY AS NEEDED FOR ANXIETY. 12/14/17   [name], MD  amLODipine (NORVASC) 5 MG tablet Take 1 tablet (5 mg total) by mouth daily. 11/29/17   Minna Merritts, MD  aspirin 81 MG chewable tablet Chew 1 tablet (81 mg total) by mouth daily. Patient taking differently: Chew 324 mg by mouth daily.  07/01/17   Dunn, Areta Haber, PA-C  Blood Glucose Monitoring Suppl (ONE TOUCH ULTRA 2) w/Device KIT Use to check blood sugar once a day. Dx. E11.9 03/18/17   [name], MD  buPROPion (WELLBUTRIN XL) 150 MG 24 hr tablet TAKE 1 TABLET BY MOUTH DAILY 08/10/17   [name], MD  calcium-vitamin D (OSCAL 500/200 D-3) 500-200 MG-UNIT tablet Take 1 tablet by mouth 2 (two) times daily. 11/04/17 11/04/18  Cuthriell, Charline Bills, PA-C  citalopram (CELEXA) 40 MG tablet Take 1 tablet (40 mg total) by mouth daily. 06/08/17   [name], MD  clopidogrel (PLAVIX) 75 MG tablet Take 1 tablet (75 mg total) by mouth daily with breakfast. 07/01/17   Dunn, Areta Haber, PA-C  Cyanocobalamin (VITAMIN B 12 PO) Take 1,000 mcg by mouth daily.     [provider]  gabapentin (NEURONTIN) 300 MG capsule TAKE 1 CAPSULE BY MOUTH TWICE A DAY Patient taking differently: Take 300 mg by mouth 2 (two) times daily.  10/30/17   [name], MD  hyoscyamine (LEVSIN SL) 0.125 MG SL tablet Place 1  tablet (0.125 mg total) under the tongue every 6 (six) hours as needed. Patient taking differently: Place 0.125 mg under the tongue every 6 (six) hours as needed for cramping.  02/01/17   [name], MD  isosorbide mononitrate (IMDUR) 120 MG 24 hr tablet Take 1 tablet (120 mg total) by mouth daily. 10/13/17   Rise Mu, PA-C  levothyroxine (SYNTHROID, LEVOTHROID) 25 MCG tablet Take 1 tablet (25 mcg total) daily before breakfast by mouth. 12/18/16   [name], MD  lipase/protease/amylase (CREON) 36000 UNITS CPEP capsule Take 36,000-72,000 Units  by mouth See admin instructions. Take 2 capsules (72,000u) three times daily with first bite of meals and 1 capsule (36,000u) daily with first bite of snacks 11/24/17   [provider]  losartan (COZAAR) 25 MG tablet Take 1/2 tablet (12.5 mg) by mouth once daily Patient taking differently: Take 25 mg by mouth daily. Take 1/2 tablet (12.5 mg) by mouth once daily 10/13/17   Rise Mu, PA-C  metoprolol succinate (TOPROL-XL) 50 MG 24 hr tablet Take 1 tablet (50 mg total) by mouth daily. Take with or immediately following a meal. 07/01/17   Dunn, Areta Haber, PA-C  Multiple Vitamin (MULTIVITAMIN WITH MINERALS) TABS tablet Take 1 tablet by mouth daily. 04/14/17   Gouru, Illene Silver, MD  nitroGLYCERIN (NITROSTAT) 0.4 MG SL tablet Place 1 tablet (0.4 mg total) under the tongue every 5 (five) minutes as needed for chest pain. 06/02/17   Minna Merritts, MD  pantoprazole (PROTONIX) 40 MG tablet Take 1 tablet (40 mg total) by mouth 2 (two) times daily. 05/11/16   [name], MD  promethazine (PHENERGAN) 25 MG tablet Take 1 tablet (25 mg total) by mouth every 6 (six) hours as needed for nausea or vomiting. 10/20/17   [name], MD  pyridostigmine (MESTINON) 60 MG tablet Take 60 mg by mouth 3 (three) times daily.  07/26/17   [provider]  rosuvastatin (CRESTOR) 10 MG tablet Take 1 tablet (10 mg total) by mouth daily. 08/10/17   Minna Merritts, MD    Allergies  Allergen Reactions  . Lipitor [Atorvastatin] Other (See Comments)    Leg pains  . Tramadol Other (See Comments)    Mouth feels like it's on fire    Family History  Problem Relation Age of Onset  . Hypertension Mother   . Anxiety disorder Mother   . Depression Mother   . Bipolar disorder Mother   . Heart disease Mother        No details  . Hyperlipidemia Mother   . Kidney disease Father   . Heart disease Father 34  . Hypertension Father   . Diabetes Father   . Stroke Father   . Colon cancer Father        dx in his 30's  . Anxiety disorder Father   . Depression Father   . Skin cancer Father   . Kidney disease Sister   . Thyroid nodules Sister   . Hypertension Sister   . Hypertension Sister   . Diabetes Sister   . Hyperlipidemia Sister   . Depression Sister   . Breast cancer Maternal Aunt 63  . Breast cancer Maternal Aunt 88  . Ovarian cancer Cousin   . Colon cancer Cousin   . Breast cancer Other   . Kidney cancer Neg Hx   . Bladder Cancer Neg Hx     Social History Social History   Tobacco Use  . Smoking status: Former Smoker    Types: Cigarettes    Last attempt to quit: 08/31/1994    Years since quitting: 23.3  . Smokeless tobacco: Never Used  . Tobacco comment: quit 28 years ago  Substance Use Topics  . Alcohol use: No    Alcohol/week: 0.0 standard drinks  . Drug use: No    Review of Systems Constitutional: Negative for fever. Cardiovascular: Positive for chest pain, now resolved Respiratory: Negative for shortness of breath. Gastrointestinal: Negative for abdominal pain, vomiting and diarrhea. Genitourinary: Negative for urinary compaints Musculoskeletal: Negative for musculoskeletal complaints  Skin: Negative for skin complaints  Neurological: Moderate headache All other ROS negative  ____________________________________________   PHYSICAL EXAM:  VITAL SIGNS: ED Triage Vitals  Enc Vitals Group     BP 12/23/17 2049 (!)  184/81     Pulse Rate 12/23/17 2049 (!) 58     Resp --      Temp 12/23/17 2049 98.3 F (36.8 C)     Temp src --      SpO2 12/23/17 2049 100 %     Weight 12/23/17 2054 169 lb 12.1 oz (77 kg)     Height 12/23/17 2054 5' 2" (1.575 m)     Head Circumference --      Peak Flow --      Pain Score 12/23/17 2053 0     Pain Loc --      Pain Edu? --      Excl. in [place]? --    Constitutional: Alert and oriented. Well appearing and in no distress. Eyes: Normal exam ENT   Head: Normocephalic and atraumatic.  Moderate occipital scalp tenderness no obvious hematoma.   Mouth/Throat: Mucous membranes are moist. Cardiovascular: Normal rate, regular rhythm. No murmur Respiratory: Normal respiratory effort without tachypnea nor retractions. Breath sounds are clear  Gastrointestinal: Soft and nontender. No distention.  Musculoskeletal: Nontender with normal range of motion in all extremities. Neurologic:  Normal speech and language. No gross focal neurologic deficits Skin:  Skin is warm, dry and intact.  Psychiatric: Mood and affect are normal.   ____________________________________________    EKG  EKG reviewed and interpreted by myself shows sinus bradycardia 58 bpm with a narrow QRS, normal axis, normal intervals, no concerning ST changes.  No ST elevation.  ____________________________________________    RADIOLOGY  CT scans are negative for acute abnormality.  Chest x-ray is negative  ____________________________________________   INITIAL IMPRESSION / ASSESSMENT AND PLAN / ED COURSE  Pertinent labs & imaging results that were available during my care of the patient were reviewed by me and considered in my medical decision making (see chart for details).  Presents to the emergency department for chest pain fall and head injury now with headache.  Patient states she has been seen for her chest pain multiple times, she has been told by cardiology there is nothing they can really do  about it.  Has been using nitroglycerin on a near daily basis.  Follows up with Dr. [name] and Dr. Fletcher Anon.  Patient states her main concern is her headache after falling and hitting her head this morning.  Will obtain CT imaging of the head we will check lab work including cardiac enzymes as well as a chest x-ray, as well as an EKG.  Patient agreeable to plan of care.  We will dose pain medication for her headache.  Patient states her syncopal episode this morning occurred after taking nitroglycerin and then standing to put more weight on the fire.  Highly suspect orthostatic syncope.  CT scans and x-rays are negative.  Labs are reassuring including a negative troponin, EKG reassuring.  We will discharge patient home with cardiology follow-up.  Patient agreeable to plan of care. ____________________________________________   FINAL CLINICAL IMPRESSION(S) / ED DIAGNOSES  Chest pain Syncope Headache    Harvest Dark, MD 12/23/17 2312

## 2017-12-23 NOTE — ED Triage Notes (Signed)
Pt reports having chest pain off and on today ,  Pt reports waking up on the floor does not remember falling but thinks she tripped on the dog bed , Pt reports pain is better after 2 sl nitro at home .

## 2017-12-25 NOTE — Telephone Encounter (Signed)
I would not offer any other input than already provided by Drs. Gollan &Arida.  Glenetta Hew, MD

## 2017-12-25 NOTE — Telephone Encounter (Signed)
Triage, can we call patient to clarify what she would like She is welcome to see whoever she likes Does she want a second opinion on her CAD and chest pain with Dr. Ellyn Hack? Just had cath with Dr. Fletcher Anon. Medical management recommended ostial diagonal disease not amenable to intervention with stent

## 2017-12-27 DIAGNOSIS — N179 Acute kidney failure, unspecified: Secondary | ICD-10-CM | POA: Diagnosis not present

## 2017-12-27 DIAGNOSIS — N2581 Secondary hyperparathyroidism of renal origin: Secondary | ICD-10-CM | POA: Diagnosis not present

## 2017-12-27 DIAGNOSIS — E1122 Type 2 diabetes mellitus with diabetic chronic kidney disease: Secondary | ICD-10-CM | POA: Diagnosis not present

## 2017-12-27 DIAGNOSIS — N184 Chronic kidney disease, stage 4 (severe): Secondary | ICD-10-CM | POA: Diagnosis not present

## 2017-12-27 DIAGNOSIS — N183 Chronic kidney disease, stage 3 (moderate): Secondary | ICD-10-CM | POA: Diagnosis not present

## 2017-12-27 DIAGNOSIS — I10 Essential (primary) hypertension: Secondary | ICD-10-CM | POA: Diagnosis not present

## 2017-12-27 NOTE — Telephone Encounter (Signed)
Call placed to the patient for clarification. She would like to stay with Dr. [name] but would like a second opinion from Dr. Ellyn Hack on her CAD. Dr. Ellyn Hack currently sees her father who has suggested that she see Dr. Ellyn Hack.  The patient has been made aware of Dr.Harding's recommendations. She stated that she will call back with a decision as to whether or not she would like to see Dr. Ellyn Hack for a second opinion.

## 2017-12-27 NOTE — Telephone Encounter (Signed)
Patient stated that she will stay with Dr. [name] and appreciated all of the help.

## 2018-01-03 NOTE — Progress Notes (Signed)
Cardiology Office Note Date:  01/05/2018  Patient ID:  [name], [name] [birth year], MRN [id number] PCP:  [name], MD  Cardiologist:  Dr. [name], MD    Chief Complaint: Hospital follow up  History of Present Illness: [name] is a 47 y.o. female with history of  CAD status post prior interventions as outlined below, hypertension, hyperlipidemia, type 2 diabetes mellitus, stage III chronic kidney disease, prior strokes, obesity status post gastric bypass, prior left carotid to subclavian artery bypass with subclavian artery ligation, anemia, bipolar disorder, and aortic arch aneurysmwho presents for hospital follow up after her recent admission to [facility] from 11/17-11/19 for unstable angina.  She was initially diagnosed withCADin 08/2003, at which time catheterization revealed moderate LAD stenosis, which was medically managed.More recently, in10/2016, she required PCI and drug-eluting stent placement to her mid LAD.In 06/2015 repeat [facility] patency of the LAD stent but she had developed significant first diagonal disease and this was successfully treated with a drug-eluting stent. She hadrecurrent angina in 06/2017 and catheterization at that time showed severe stenosis in the second diagonal and this was treated with balloon angioplastygiven vessel size and angulation.She did well for a few months but began to experience recurrent chest discomfort in late7/2019and was subsequently admitted to [facility]. She ruled out and underwent diagnostic LHC that showedrestenosis within the second diagonal. This was treated withcuttingballoon angioplastyas this vessel was again not felt to be optimal for stenting due to angulation and bifurcation with the LAD. In hospital follow up on 8/22, she was doing relatively well. She did have an episode of chest pain with associated SOB while walking to get the mail that improved with rest prompting her to contact our office and be worked  in. She was started on Imdur 30 mg (previously on this medication though stopped secondary to headache) which was subsequently escalated to 60 mg daily. She reported that Ranexa did not help. In follow up on 09/30/2017, she noted the Imdur was helping some and this was titrated to 90 mg daily given some breakthrough angina. In follow up in 10/2017 she continued to note improvement in her symptoms with the addition and escalation of Imdur. She did note some orthostasis at that time leading to a fall. She was seen at an urgent care on 9/28 for a recurrent fall, that appeared to be mechanical as she fell off a porch. She was seen by EP on 11/18/17 noting improvement in her orthostasis. BP was mildly elevated at that time leading to titration of her amlodipine. She was seen by Dr. [name] on 10/28 and continued to note improvement on Imdur. She was seen in the ED on 12/17/17 with chest pain. Troponin negative. EKG not acute. Outpatient follow up was advised. She returned to the ED on 11/17 with continued chest pain and was admitted. She ruled out. She underwent repeat cath on 12/20/2017 that showed patent D1 and mid LAD stent with no significant restenosis. There was significant disease affecting the distal LAD close to the apex as well as recurrent restenosis in the ostial D2 which was previously treated as above, most recently in 09/2017 with cutting balloon angioplasty. The patient's symptoms were felt to possibly be related to the disease in the distal LAD as well as the ostial D2. The LAD disease was too distal to stent and the ostial D2 disease had been treated twice with balloon angioplasty with recurrent stenosis. This again was felt to not be an optimal location to  place a stent as it will pinch the mid LAD. The only way to treat this lesion with a stent would be to perform bifurcation stenting including the mid LAD. However, the mid LAD disease did not seem to be obstructive and it was felt the long-term risks  outweighed the benefit. Continued medical therapy was advised. She was again seen in the ED on 11/21 for multiple complaints including chest pain, headache and a fall. CT head and cervical spine were both not acute. She was noted to have stable microvascular ischemic changes, volume loss of the brain, and stable small chronic infarcts in the bilateral basal ganglia and left parietal lobe. Troponin was negative. EKG showed sinus bradycardia without acute changes.   She comes in doing reasonably well today.  She continues to have stable, intermittent chest pain that is unchanged from prior episodes.  Symptoms are not escalating.  She is upset that intervention cannot be performed though does understand this and accepts it.  She is compliant with all medications.  She has held her Plavix starting on 01/04/2018 in preparation for upcoming endoscopy and colonoscopy which is scheduled for 01/12/2018.  She has had issues with dysphasia with a prior barium swallow showing possible esophageal web.  She continues to have chronic, daily watery diarrhea which is planning to be evaluated further with a colonoscopy.  She has not had any further syncopal episodes.  She denies any lower extremity swelling, abdominal distention, PND, orthopnea, or early satiety.  She is looking forward to having these GI procedures performed.  No issues from the right radial cardiac cath site.  Past Medical History:  Diagnosis Date  . Anemia    iron deficiency anemia  . Aortic arch aneurysm ([place])   . Bipolar disorder ([place])   . BRCA negative 2014  . CAD (coronary artery disease)    a. 08/2003 Cath: LAD 30-40-med Rx; b. 11/2014 PCI: LAD 68m(3.25x23 Xience Alpine DES); c. 06/2015 PCI: D1 (2.25x12 Resolute Integrity DES); d. 06/2017 PCI: Patent mLAD stent, D2 95 (PTCA); e. 09/2017 Cath/PCI: LM nl, LAD 376m70d, D1 40, D2 99ost (CBA), LCX 40p, RCA 40ost/p.  . Marland KitchenKD (chronic kidney disease), stage III (HCR[place]  . Colon polyp   . CVA (cerebral  vascular accident) ([place]   Left side weakness.   . Diabetes ([place]  . Family history of breast cancer    BRCA neg 2014  . Gastric ulcer 04/27/2011  . History of echocardiogram    a. 03/2017 Echo: EF 60-65%, no rwma.  . Marland KitchenTN (hypertension)   . Hyperlipemia   . Hypothyroid   . Malignant melanoma of skin of scalp (HCV[place]  . MI, acute, non ST segment elevation (HC[place]ners[place]  . Neuromuscular disorder ([place]CP[place]  . S/P drug eluting coronary stent placement 06/04/2015  . Sepsis (HCStaley2/14/2019    Past Surgical History:  Procedure Laterality Date  . APPENDECTOMY    . CARDIAC CATHETERIZATION N/A 11/09/2014   Procedure: Coronary Angiography;  Surgeon: TiMinna MerrittsMD;  [place];  Service: Cardiovascular;  Laterality: N/A;  . CARDIAC CATHETERIZATION N/A 11/12/2014   Procedure: Coronary Stent Intervention;  Surgeon: AlIsaias CowmanMD;  [place] [facility];  Service: Cardiovascular;  Laterality: N/A;  . CARDIAC CATHETERIZATION N/A 04/18/2015   Procedure: Left Heart Cath and Coronary Angiography;  Surgeon: TiMinna MerrittsMD;  [place] [place];  Service: Cardiovascular;  Laterality: N/A;  . CARDIAC CATHETERIZATION Left 06/04/2015  Procedure: Left Heart Cath and Coronary Angiography;  Surgeon: Wellington Hampshire, MD;  Location: [place] CV LAB;  Service: Cardiovascular;  Laterality: Left;  . CARDIAC CATHETERIZATION N/A 06/04/2015   Procedure: Coronary Stent Intervention;  Surgeon: Wellington Hampshire, MD;  Location: [place] CV LAB;  Service: Cardiovascular;  Laterality: N/A;  . CESAREAN SECTION  2001  . CHOLECYSTECTOMY N/A 11/18/2016   Procedure: LAPAROSCOPIC CHOLECYSTECTOMY WITH INTRAOPERATIVE CHOLANGIOGRAM;  Surgeon: Christene Lye, MD;  Location: [facility] ORS;  Service: General;  Laterality: N/A;  . COLONOSCOPY WITH PROPOFOL N/A 04/27/2016   Procedure: COLONOSCOPY WITH PROPOFOL;  Surgeon: [name], MD;  Location: [place];   Service: Endoscopy;  Laterality: N/A;  . CORONARY ANGIOPLASTY    . CORONARY BALLOON ANGIOPLASTY N/A 06/29/2017   Procedure: CORONARY BALLOON ANGIOPLASTY;  Surgeon: Wellington Hampshire, MD;  Location: [place] CV LAB;  Service: Cardiovascular;  Laterality: N/A;  . CORONARY BALLOON ANGIOPLASTY N/A 09/20/2017   Procedure: CORONARY BALLOON ANGIOPLASTY;  Surgeon: Wellington Hampshire, MD;  Location: [place] CV LAB;  Service: Cardiovascular;  Laterality: N/A;  . DILATION AND CURETTAGE OF UTERUS    . ESOPHAGOGASTRODUODENOSCOPY (EGD) WITH PROPOFOL N/A 09/14/2014   Procedure: ESOPHAGOGASTRODUODENOSCOPY (EGD) WITH PROPOFOL;  Surgeon: Josefine Class, MD;  Location[facility] ENDOSCOPY;  Service: Endoscopy;  Laterality: N/A;  . ESOPHAGOGASTRODUODENOSCOPY (EGD) WITH PROPOFOL N/A 04/27/2016   Procedure: ESOPHAGOGASTRODUODENOSCOPY (EGD) WITH PROPOFOL;  Surgeon: [name], MD;  Location: [place];  Service: Endoscopy;  Laterality: N/A;  Diabetic - oral meds  . GASTRIC BYPASS  09/2009   [facility]   . Left Carotid to sublcavian artery bypass w/ subclavian artery ligation     a. Performed @ [place].  . LEFT HEART CATH AND CORONARY ANGIOGRAPHY Left 06/29/2017   Procedure: LEFT HEART CATH AND CORONARY ANGIOGRAPHY;  Surgeon: Wellington Hampshire, MD;  Location: [place] CV LAB;  Service: Cardiovascular;  Laterality: Left;  . LEFT HEART CATH AND CORONARY ANGIOGRAPHY N/A 09/20/2017   Procedure: LEFT HEART CATH AND CORONARY ANGIOGRAPHY;  Surgeon: Wellington Hampshire, MD;  Location: [place] CV LAB;  Service: Cardiovascular;  Laterality: N/A;  . LEFT HEART CATH AND CORONARY ANGIOGRAPHY N/A 12/20/2017   Procedure: LEFT HEART CATH AND CORONARY ANGIOGRAPHY;  Surgeon: Wellington Hampshire, MD;  Location: [place] CV LAB;  Service: Cardiovascular;  Laterality: N/A;  . MELANOMA EXCISION  2016   Dr. Evorn Gong  . [facility]  2002  . RIGHT OOPHORECTOMY    . SHOULDER ARTHROSCOPY WITH  OPEN ROTATOR CUFF REPAIR Right 01/07/2016   Procedure: SHOULDER ARTHROSCOPY WITH DEBRIDMENT, SUBACHROMIAL DECOMPRESSION;  Surgeon: Corky Mull, MD;  Location: [facility] ORS;  Service: Orthopedics;  Laterality: Right;  . SHOULDER ARTHROSCOPY WITH OPEN ROTATOR CUFF REPAIR Right 03/16/2017   Procedure: SHOULDER ARTHROSCOPY WITH OPEN ROTATOR CUFF REPAIR POSSIBLE BICEPS TENODESIS;  Surgeon: Corky Mull, MD;  Location: [facility] ORS;  Service: Orthopedics;  Laterality: Right;  . TRIGGER FINGER RELEASE Right     Middle Finger    Current Meds  Medication Sig  . ALPRAZolam (XANAX) 1 MG tablet TAKE 1 TABLET (1 MG TOTAL) BY MOUTH DAILY AS NEEDED FOR ANXIETY.  Marland Kitchen amLODipine (NORVASC) 5 MG tablet Take 1 tablet (5 mg total) by mouth daily.  Marland Kitchen aspirin 81 MG chewable tablet Chew 1 tablet (81 mg total) by mouth daily. (Patient taking differently: Chew 324 mg by mouth daily. )  . Blood Glucose Monitoring Suppl (ONE TOUCH ULTRA 2) w/Device  KIT Use to check blood sugar once a day. Dx. E11.9  . buPROPion (WELLBUTRIN XL) 150 MG 24 hr tablet TAKE 1 TABLET BY MOUTH DAILY  . citalopram (CELEXA) 40 MG tablet Take 1 tablet (40 mg total) by mouth daily.  . Cyanocobalamin (VITAMIN B 12 PO) Take 1,000 mcg by mouth daily.   Marland Kitchen gabapentin (NEURONTIN) 300 MG capsule TAKE 1 CAPSULE BY MOUTH TWICE A DAY (Patient taking differently: Take 300 mg by mouth 2 (two) times daily. )  . hyoscyamine (LEVSIN SL) 0.125 MG SL tablet Place 1 tablet (0.125 mg total) under the tongue every 6 (six) hours as needed. (Patient taking differently: Place 0.125 mg under the tongue every 6 (six) hours as needed for cramping. )  . isosorbide mononitrate (IMDUR) 120 MG 24 hr tablet Take 1 tablet (120 mg total) by mouth daily.  Marland Kitchen levothyroxine (SYNTHROID, LEVOTHROID) 25 MCG tablet Take 1 tablet (25 mcg total) daily before breakfast by mouth.  . losartan (COZAAR) 25 MG tablet Take 1/2 tablet (12.5 mg) by mouth once daily (Patient taking differently: Take 25 mg by  mouth daily. Take 1/2 tablet (12.5 mg) by mouth once daily)  . metoprolol succinate (TOPROL-XL) 50 MG 24 hr tablet Take 1 tablet (50 mg total) by mouth daily. Take with or immediately following a meal.  . nitroGLYCERIN (NITROSTAT) 0.4 MG SL tablet Place 1 tablet (0.4 mg total) under the tongue every 5 (five) minutes as needed for chest pain.  . pantoprazole (PROTONIX) 40 MG tablet Take 1 tablet (40 mg total) by mouth 2 (two) times daily.  . promethazine (PHENERGAN) 25 MG tablet Take 1 tablet (25 mg total) by mouth every 6 (six) hours as needed for nausea or vomiting.  . pyridostigmine (MESTINON) 60 MG tablet Take 60 mg by mouth 3 (three) times daily.   . rosuvastatin (CRESTOR) 10 MG tablet Take 1 tablet (10 mg total) by mouth daily.    Allergies:   Lipitor [atorvastatin] and Tramadol   Social History:  The patient  reports that she quit smoking about 23 years ago. Her smoking use included cigarettes. She has never used smokeless tobacco. She reports that she does not drink alcohol or use drugs.   Family History:  The patient's family history includes Anxiety disorder in her father and mother; Bipolar disorder in her mother; Breast cancer in her other; Breast cancer (age of onset: 43) in her maternal aunt and maternal aunt; Colon cancer in her cousin and father; Depression in her father, mother, and sister; Diabetes in her father and sister; Heart disease in her mother; Heart disease (age of onset: 30) in her father; Hyperlipidemia in her mother and sister; Hypertension in her father, mother, sister, and sister; Kidney disease in her father and sister; Ovarian cancer in her cousin; Skin cancer in her father; Stroke in her father; Thyroid nodules in her sister.  ROS:   Review of Systems  Constitutional: Positive for malaise/fatigue. Negative for chills, diaphoresis, fever and weight loss.  HENT: Negative for congestion.   Eyes: Negative for discharge and redness.  Respiratory: Positive for  shortness of breath. Negative for cough, hemoptysis, sputum production and wheezing.   Cardiovascular: Positive for chest pain. Negative for palpitations, orthopnea, claudication, leg swelling and PND.  Gastrointestinal: Positive for diarrhea. Negative for abdominal pain, blood in stool, constipation, heartburn, melena, nausea and vomiting.       Positive for dysphasia  Genitourinary: Negative for hematuria.  Musculoskeletal: Negative for falls and myalgias.  Skin: Negative for  rash.  Neurological: Positive for dizziness and weakness. Negative for tingling, tremors, sensory change, speech change, focal weakness and loss of consciousness.  Endo/Heme/Allergies: Does not bruise/bleed easily.  Psychiatric/Behavioral: Negative for substance abuse. The patient is not nervous/anxious.   All other systems reviewed and are negative.    PHYSICAL EXAM:  VS:  BP 120/80 (BP Location: Left Arm, Patient Position: Sitting, Cuff Size: Normal)   Pulse 89   Ht '5\' 2"'  (1.575 m)   Wt 172 lb (78 kg)   BMI 31.46 kg/m  BMI: Body mass index is 31.46 kg/m.  Physical Exam  Constitutional: She is oriented to person, place, and time. She appears well-developed and well-nourished.  HENT:  Head: Normocephalic and atraumatic.  Eyes: Right eye exhibits no discharge. Left eye exhibits no discharge.  Neck: Normal range of motion. No JVD present.  Cardiovascular: Normal rate, regular rhythm, S1 normal, S2 normal and normal heart sounds. Exam reveals no distant heart sounds, no friction rub, no midsystolic click and no opening snap.  No murmur heard. Pulses:      Posterior tibial pulses are 2+ on the right side, and 2+ on the left side.  Right radial cardiac cath site is well-healing without active bleeding, bruising, swelling, erythema, warmth, or tenderness to palpation.  Radial pulse 2+.  Pulmonary/Chest: Effort normal and breath sounds normal. No respiratory distress. She has no decreased breath sounds. She has no  wheezes. She has no rales. She exhibits no tenderness.  Abdominal: Soft. She exhibits no distension. There is no tenderness.  Musculoskeletal: She exhibits no edema.  Neurological: She is alert and oriented to person, place, and time.  Skin: Skin is warm and dry. No cyanosis. Nails show no clubbing.  Psychiatric: She has a normal mood and affect. Her speech is normal and behavior is normal. Judgment and thought content normal.     EKG:  Was ordered and interpreted by me today. Shows NSR, 89 bpm, left atrial enlargement, nonspecific ST-T changes (essentially unchanged from prior)  Recent Labs: 03/19/2017: TSH 1.013 06/18/2017: BNP 317.4 07/09/2017: Magnesium 1.6 12/23/2017: ALT 25; BUN 19; Creatinine, Ser 1.84; Hemoglobin 11.5; Platelets 168; Potassium 4.2; Sodium 140  09/20/2017: Cholesterol 73; HDL 29; LDL Cholesterol 35; Total CHOL/HDL Ratio 2.5; Triglycerides 45; VLDL 9   Estimated Creatinine Clearance: 35.8 mL/min (A) (by C-G formula based on SCr of 1.84 mg/dL (H)).   Wt Readings from Last 3 Encounters:  01/05/18 172 lb (78 kg)  12/23/17 169 lb 12.1 oz (77 kg)  12/20/17 170 lb 6.7 oz (77.3 kg)    Orthostatic vital signs: Lying: 135/86, 93 bpm Sitting: 122/75, 99 bpm, dizzy Standing: 110/78, 109 bpm Standing x3 minutes: 112/78, 109 bpm  Other studies reviewed: Additional studies/records reviewed today include: summarized above  ASSESSMENT AND PLAN:  1. CAD involving the native coronary arteries with stable angina: Currently chest pain-free.  She continues to have stable angina in the setting of her known CAD as outlined above that is not amenable to stenting.  Risks of percutaneous coronary intervention continue to outweigh benefit per interventional cardiology.  She will remain on dual antiplatelet therapy with aspirin and Plavix.  However, Plavix is currently held in preparation for upcoming colonoscopy and endoscopy as outlined below.  Following these procedures, we have  recommended that she resume the Plavix as soon as safely felt possible by GI.  She will otherwise remain on Imdur 120 mg daily, Toprol 50 mg daily, and Norvasc 5 mg daily for antianginal therapy.  She  declines re-trial of Ranexa at this time.  Aggressive secondary prevention.  2. CKD stage III: Patient reports seeing her nephrologist this past week with follow-up renal function since her cardiac cath and discharge showing slight worsening in her creatinine.  She has planned follow-up with nephrology.  We will attempt to get these labs.  3. Syncope: No recent syncopal episodes.  Prior episodes have been felt to be in the setting of orthostasis/autonomic dysfunction in the setting of prior gastric bypass surgery, and diabetic neuropathy.  She also continues to have daily chronic watery diarrhea which has likely been contributing to these episodes.  She is scheduled for colonoscopy as outlined below.  Recommend she stay adequately hydrated and change positions slowly.  4. Hypertension: Blood pressure is well controlled today.  Continue amlodipine, losartan, Imdur, and Toprol.  5. Hyperlipidemia: LDL of 35 in 09/2017.  Remains on Crestor with normal AST/ALT in 12/2017.  6. Chronic diarrhea/dysphagia: Followed by GI.  Scheduled for endoscopy and colonoscopy.  She will be at least moderate, if not high risk for noncardiac procedures in the setting of her underlying known CAD that is not amenable to PCI as outlined above.  No further cardiac testing at this time would further reduce this risk.  She has been off of Plavix since 01/04/2018 in preparation for these procedures which are scheduled for 01/12/2018.  She is to remain on aspirin 81 mg daily throughout her peri-procedure timeframe.  It has been greater than 12 months since her last stenting.  Would recommend that her Plavix be resumed as soon as safely felt possible by GI.  Disposition: F/u with Dr. [name] or an APP in 3 months.  Current medicines are  reviewed at length with the patient today.  The patient did not have any concerns regarding medicines.  Signed, Christell Faith, PA-C 01/05/2018 1:52 PM     [address] [place],  [id number] [phone]

## 2018-01-05 ENCOUNTER — Ambulatory Visit (INDEPENDENT_AMBULATORY_CARE_PROVIDER_SITE_OTHER): Payer: Medicare Other | Admitting: Physician Assistant

## 2018-01-05 ENCOUNTER — Encounter: Payer: Self-pay | Admitting: Physician Assistant

## 2018-01-05 VITALS — BP 120/80 | HR 89 | Ht 62.0 in | Wt 172.0 lb

## 2018-01-05 DIAGNOSIS — I25118 Atherosclerotic heart disease of native coronary artery with other forms of angina pectoris: Secondary | ICD-10-CM | POA: Diagnosis not present

## 2018-01-05 DIAGNOSIS — I200 Unstable angina: Secondary | ICD-10-CM | POA: Diagnosis not present

## 2018-01-05 DIAGNOSIS — N183 Chronic kidney disease, stage 3 unspecified: Secondary | ICD-10-CM

## 2018-01-05 DIAGNOSIS — I10 Essential (primary) hypertension: Secondary | ICD-10-CM

## 2018-01-05 DIAGNOSIS — K529 Noninfective gastroenteritis and colitis, unspecified: Secondary | ICD-10-CM | POA: Diagnosis not present

## 2018-01-05 DIAGNOSIS — E785 Hyperlipidemia, unspecified: Secondary | ICD-10-CM

## 2018-01-05 DIAGNOSIS — R55 Syncope and collapse: Secondary | ICD-10-CM | POA: Diagnosis not present

## 2018-01-05 DIAGNOSIS — R4702 Dysphasia: Secondary | ICD-10-CM

## 2018-01-05 NOTE — Patient Instructions (Signed)
Medication Instructions:  - Your physician recommends that you continue on your current medications as directed. Please refer to the Current Medication list given to you today.  If you need a refill on your cardiac medications before your next appointment, please call your pharmacy.   Lab work: - none ordered  If you have labs (blood work) drawn today and your tests are completely normal, you will receive your results only by: Marland Kitchen MyChart Message (if you have MyChart) OR . A paper copy in the mail If you have any lab test that is abnormal or we need to change your treatment, we will call you to review the results.  Testing/Procedures: - none ordered  Follow-Up: At [facility], you and your health needs are our priority.  As part of our continuing mission to provide you with exceptional heart care, we have created designated Provider Care Teams.  These Care Teams include your primary Cardiologist (physician) and Advanced Practice Providers (APPs -  Physician Assistants and Nurse Practitioners) who all work together to provide you with the care you need, when you need it. . in 3 months with Dr. Louisa Second, PA  Any Other Special Instructions Will Be Listed Below (If Applicable). - N/A

## 2018-01-06 IMAGING — US US RENAL
1 series · 14 of 25 positions shown · non-contrast
Comparison: CT abdomen pelvis 10/29/2012

CLINICAL DATA: Acute renal failure, diabetes, hypertension

EXAM:
RENAL / URINARY TRACT ULTRASOUND COMPLETE

[Series 1: us renal · 0.25mm/px · 14 of 26 slices shown]
[im 1/26]
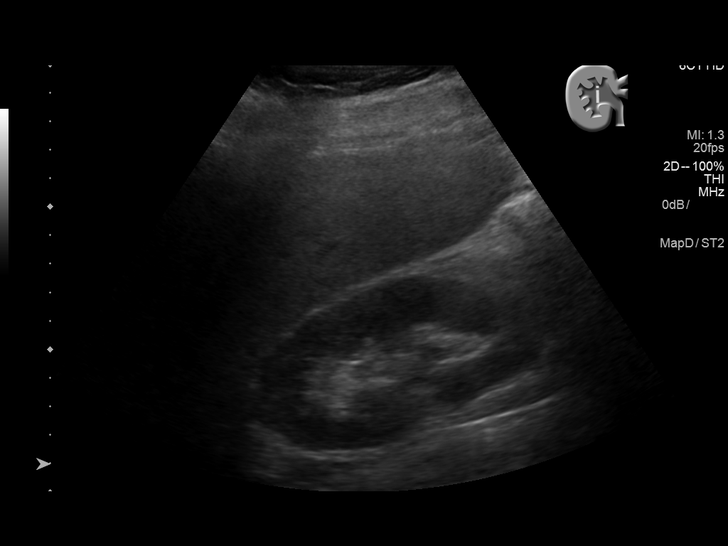
[im 3/26]
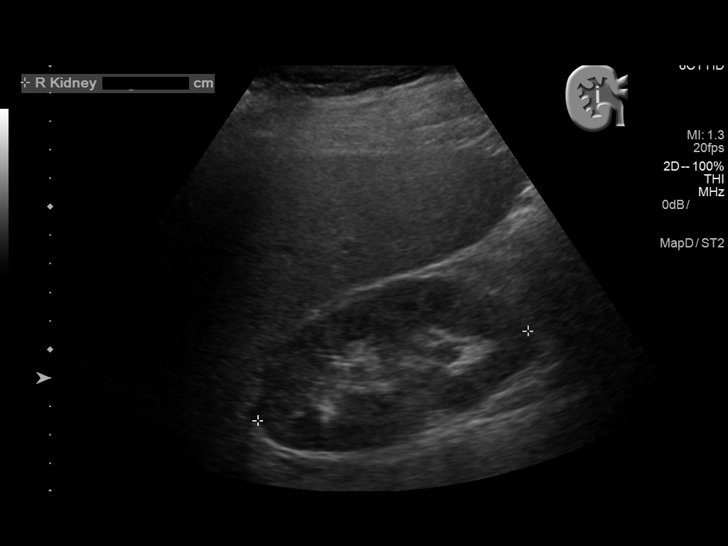
[im 5/26]
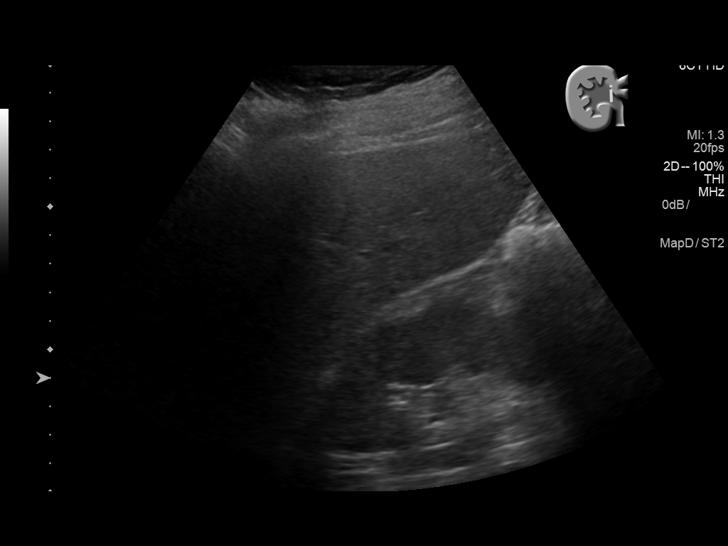
[im 7/26]
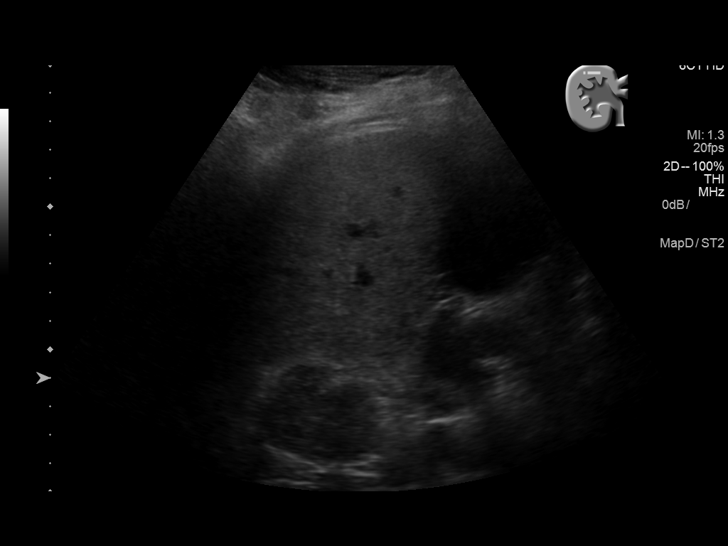
[im 9/26]
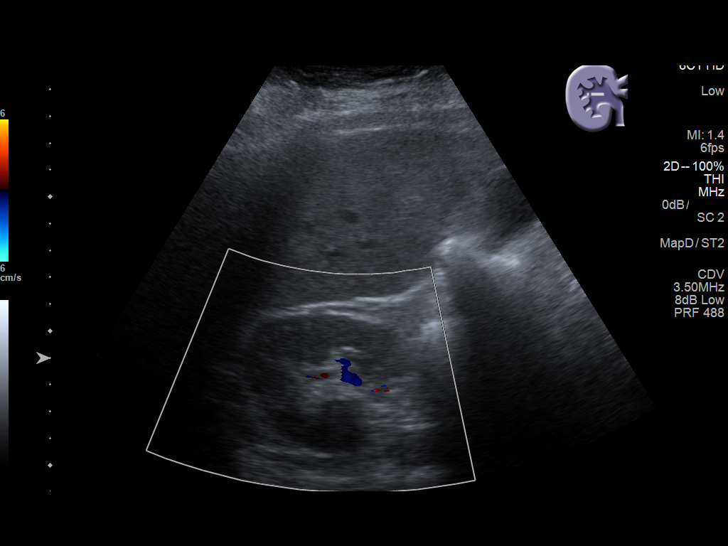
[im 10/26]
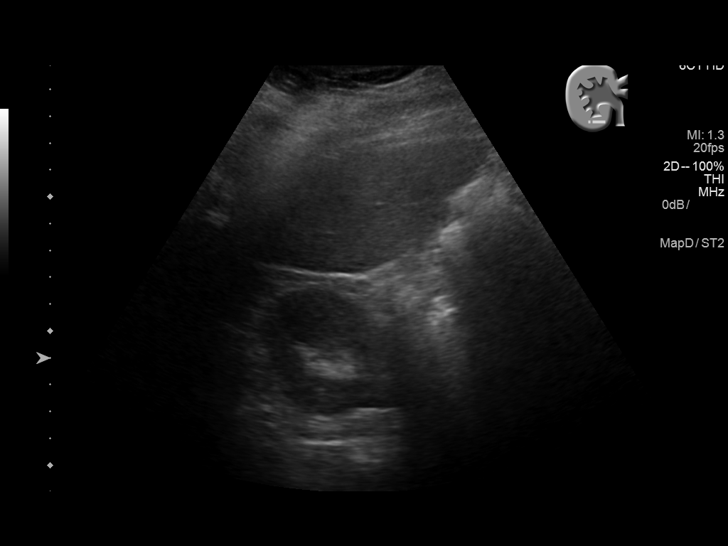
[im 12/26]
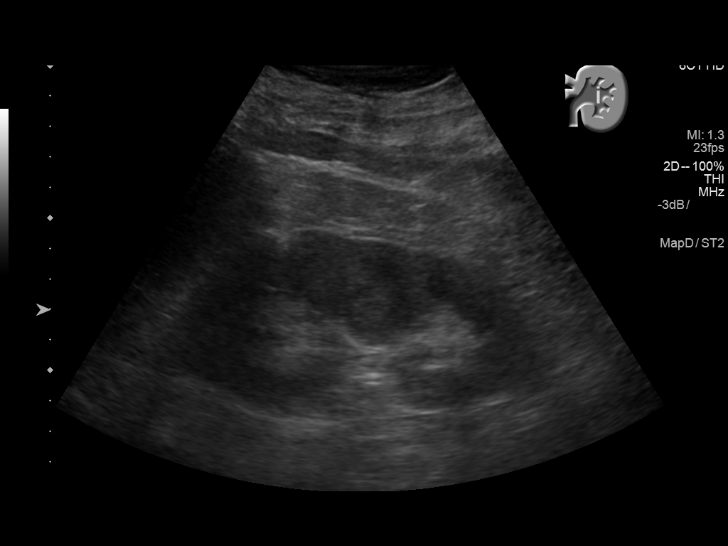
[im 14/26]
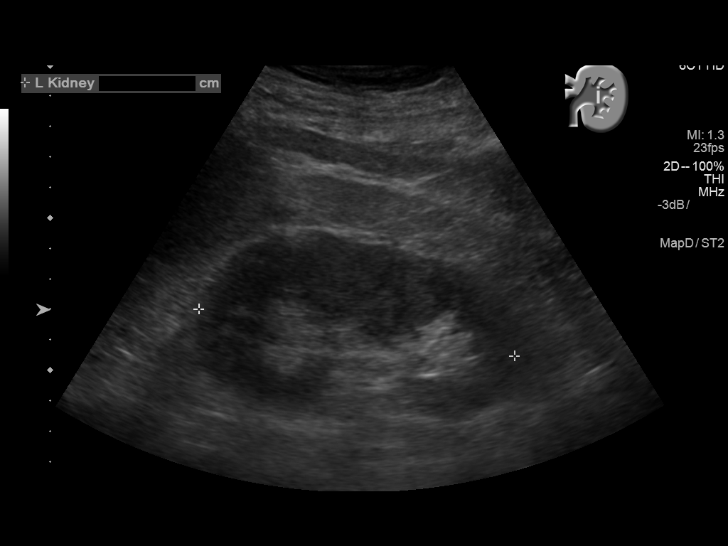
[im 16/26]
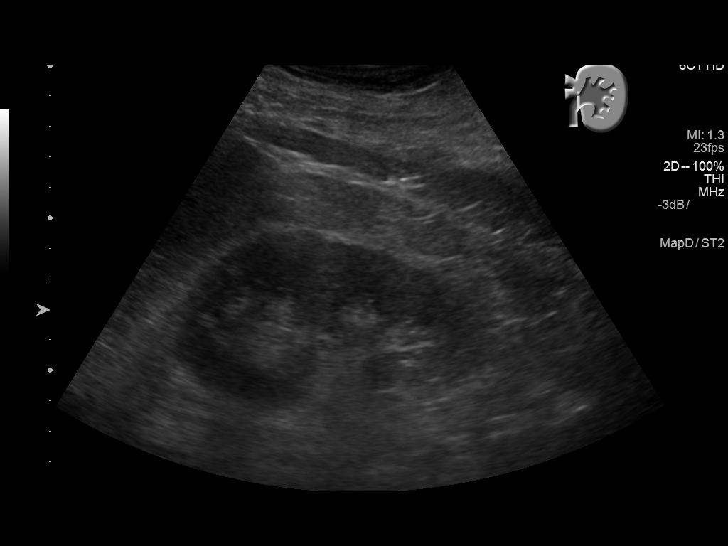
[im 17/26]
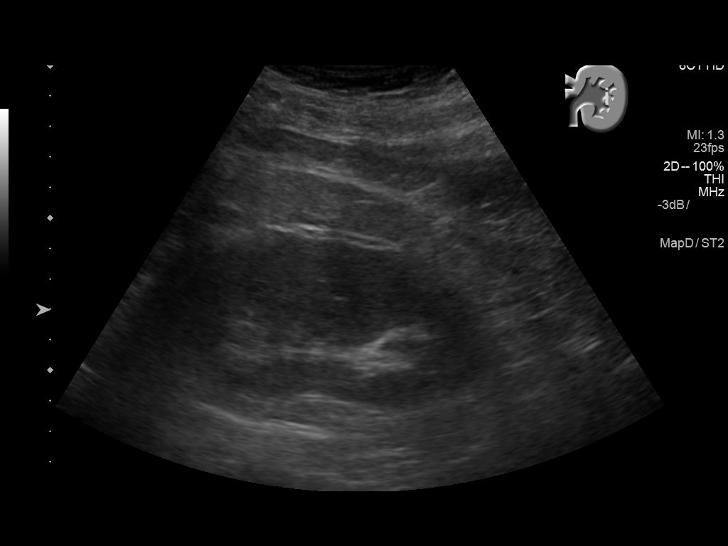
[im 19/26]
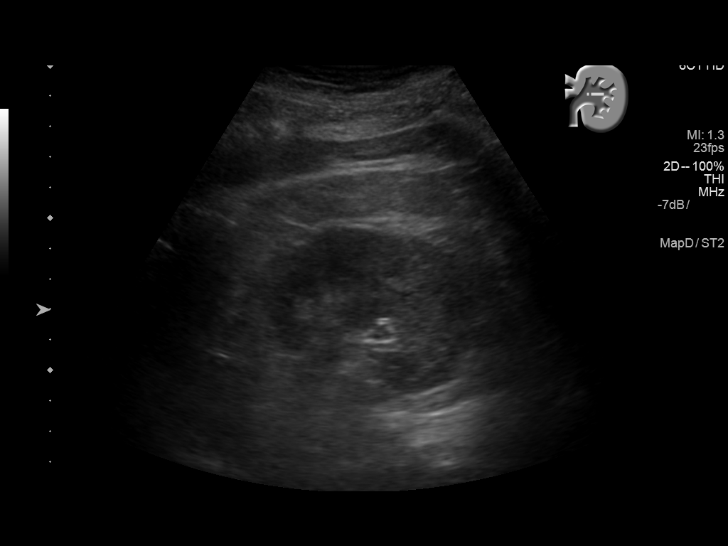
[im 21/26]
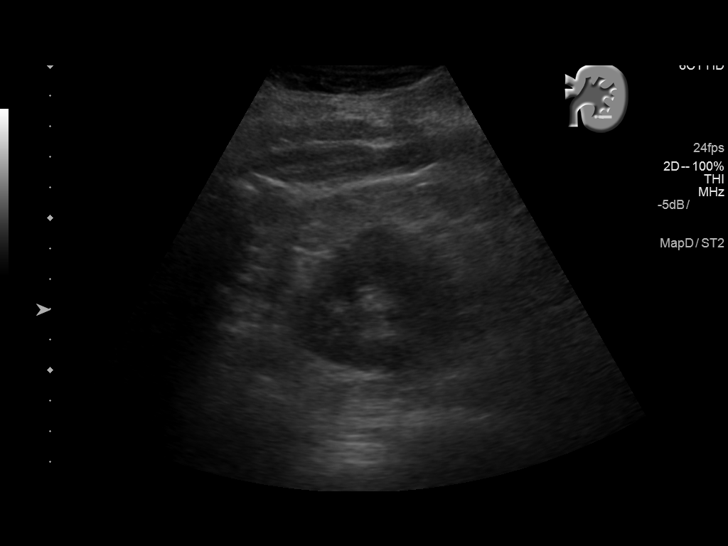
[im 23/26]
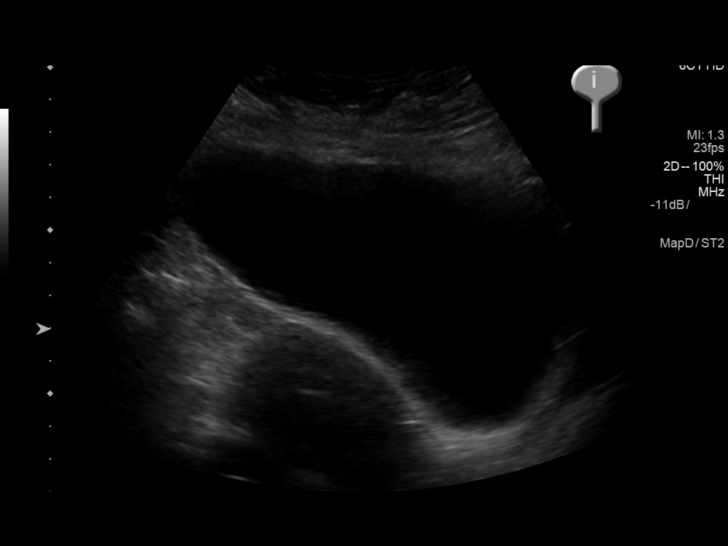
[im 26/26]
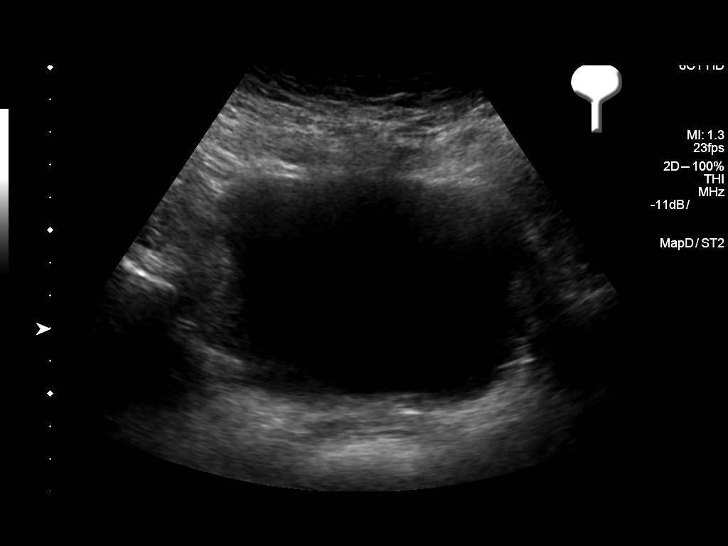

[14 of 25 positions shown; findings below may reference images not displayed]

FINDINGS: Right Kidney:

Length: 10.0 cm.. No hydronephrosis is seen. The echogenicity of the
renal parenchyma is within normal limits.

Left Kidney:

Length: 10.5 cm.. No hydronephrosis is noted. The echogenicity of
the renal parenchyma is within normal limits.

Bladder:

The urinary bladder is unremarkable.
IMPRESSION: Negative ultrasound of the kidneys.  No hydronephrosis.

## 2018-01-11 DIAGNOSIS — M461 Sacroiliitis, not elsewhere classified: Secondary | ICD-10-CM | POA: Diagnosis not present

## 2018-01-12 ENCOUNTER — Ambulatory Visit
Admission: RE | Admit: 2018-01-12 | Discharge: 2018-01-12 | Disposition: A | Discharge status: Home / Self Care | Payer: Medicare Other | Attending: Internal Medicine | Admitting: Internal Medicine

## 2018-01-12 ENCOUNTER — Ambulatory Visit: Payer: Medicare Other | Admitting: Certified Registered"

## 2018-01-12 ENCOUNTER — Encounter
Admission: RE | Disposition: A | Discharge status: Home / Self Care | Payer: Self-pay | Source: Home / Self Care | Attending: Internal Medicine

## 2018-01-12 DIAGNOSIS — Z79890 Hormone replacement therapy (postmenopausal): Secondary | ICD-10-CM | POA: Diagnosis not present

## 2018-01-12 DIAGNOSIS — N183 Chronic kidney disease, stage 3 (moderate): Secondary | ICD-10-CM | POA: Insufficient documentation

## 2018-01-12 DIAGNOSIS — Z85820 Personal history of malignant melanoma of skin: Secondary | ICD-10-CM | POA: Insufficient documentation

## 2018-01-12 DIAGNOSIS — R197 Diarrhea, unspecified: Secondary | ICD-10-CM | POA: Diagnosis not present

## 2018-01-12 DIAGNOSIS — I69354 Hemiplegia and hemiparesis following cerebral infarction affecting left non-dominant side: Secondary | ICD-10-CM | POA: Insufficient documentation

## 2018-01-12 DIAGNOSIS — R1310 Dysphagia, unspecified: Secondary | ICD-10-CM | POA: Insufficient documentation

## 2018-01-12 DIAGNOSIS — Z79899 Other long term (current) drug therapy: Secondary | ICD-10-CM | POA: Insufficient documentation

## 2018-01-12 DIAGNOSIS — E039 Hypothyroidism, unspecified: Secondary | ICD-10-CM | POA: Diagnosis not present

## 2018-01-12 DIAGNOSIS — I130 Hypertensive heart and chronic kidney disease with heart failure and stage 1 through stage 4 chronic kidney disease, or unspecified chronic kidney disease: Secondary | ICD-10-CM | POA: Insufficient documentation

## 2018-01-12 DIAGNOSIS — I5022 Chronic systolic (congestive) heart failure: Secondary | ICD-10-CM | POA: Diagnosis not present

## 2018-01-12 DIAGNOSIS — R933 Abnormal findings on diagnostic imaging of other parts of digestive tract: Secondary | ICD-10-CM | POA: Diagnosis not present

## 2018-01-12 DIAGNOSIS — Z955 Presence of coronary angioplasty implant and graft: Secondary | ICD-10-CM | POA: Insufficient documentation

## 2018-01-12 DIAGNOSIS — Z980 Intestinal bypass and anastomosis status: Secondary | ICD-10-CM | POA: Insufficient documentation

## 2018-01-12 DIAGNOSIS — Z7982 Long term (current) use of aspirin: Secondary | ICD-10-CM | POA: Insufficient documentation

## 2018-01-12 DIAGNOSIS — I509 Heart failure, unspecified: Secondary | ICD-10-CM | POA: Diagnosis not present

## 2018-01-12 DIAGNOSIS — Z8719 Personal history of other diseases of the digestive system: Secondary | ICD-10-CM | POA: Insufficient documentation

## 2018-01-12 DIAGNOSIS — I2510 Atherosclerotic heart disease of native coronary artery without angina pectoris: Secondary | ICD-10-CM | POA: Diagnosis not present

## 2018-01-12 DIAGNOSIS — I252 Old myocardial infarction: Secondary | ICD-10-CM | POA: Diagnosis not present

## 2018-01-12 DIAGNOSIS — Z9884 Bariatric surgery status: Secondary | ICD-10-CM | POA: Diagnosis not present

## 2018-01-12 DIAGNOSIS — R109 Unspecified abdominal pain: Secondary | ICD-10-CM | POA: Diagnosis not present

## 2018-01-12 DIAGNOSIS — Z800 Family history of malignant neoplasm of digestive organs: Secondary | ICD-10-CM | POA: Diagnosis not present

## 2018-01-12 DIAGNOSIS — F319 Bipolar disorder, unspecified: Secondary | ICD-10-CM | POA: Insufficient documentation

## 2018-01-12 DIAGNOSIS — K591 Functional diarrhea: Secondary | ICD-10-CM | POA: Insufficient documentation

## 2018-01-12 DIAGNOSIS — K219 Gastro-esophageal reflux disease without esophagitis: Secondary | ICD-10-CM | POA: Diagnosis not present

## 2018-01-12 DIAGNOSIS — E1122 Type 2 diabetes mellitus with diabetic chronic kidney disease: Secondary | ICD-10-CM | POA: Insufficient documentation

## 2018-01-12 DIAGNOSIS — N184 Chronic kidney disease, stage 4 (severe): Secondary | ICD-10-CM | POA: Diagnosis not present

## 2018-01-12 HISTORY — PX: COLONOSCOPY WITH PROPOFOL: SHX5780

## 2018-01-12 HISTORY — PX: ESOPHAGOGASTRODUODENOSCOPY (EGD) WITH PROPOFOL: SHX5813

## 2018-01-12 LAB — POCT PREGNANCY, URINE: PREG TEST UR: NEGATIVE

## 2018-01-12 LAB — GLUCOSE, CAPILLARY: Glucose-Capillary: 102 mg/dL — ABNORMAL HIGH (ref 70–99)

## 2018-01-12 SURGERY — COLONOSCOPY WITH PROPOFOL
Anesthesia: General

## 2018-01-12 MED ORDER — EPHEDRINE SULFATE 50 MG/ML IJ SOLN
INTRAMUSCULAR | Status: DC | PRN
  Administered 2018-01-12: 10 mg via INTRAVENOUS
  Administered 2018-01-12: 5 mg via INTRAVENOUS
  Administered 2018-01-12: 10 mg via INTRAVENOUS
  Administered 2018-01-12: 5 mg via INTRAVENOUS

## 2018-01-12 MED ORDER — FENTANYL CITRATE (PF) 100 MCG/2ML IJ SOLN
INTRAMUSCULAR | Status: AC
  Filled 2018-01-12: qty 2

## 2018-01-12 MED ORDER — LIDOCAINE HCL (CARDIAC) PF 100 MG/5ML IV SOSY
PREFILLED_SYRINGE | INTRAVENOUS | Status: DC | PRN
  Administered 2018-01-12: 50 mg via INTRATRACHEAL

## 2018-01-12 MED ORDER — SODIUM CHLORIDE 0.9 % IV SOLN
INTRAVENOUS | Status: DC
  Administered 2018-01-12: 1000 mL via INTRAVENOUS

## 2018-01-12 MED ORDER — EPHEDRINE SULFATE 50 MG/ML IJ SOLN
INTRAMUSCULAR | Status: AC
  Filled 2018-01-12: qty 1

## 2018-01-12 MED ORDER — PROPOFOL 500 MG/50ML IV EMUL
INTRAVENOUS | Status: DC | PRN
  Administered 2018-01-12: 100 ug/kg/min via INTRAVENOUS

## 2018-01-12 MED ORDER — GLYCOPYRROLATE 0.2 MG/ML IJ SOLN
INTRAMUSCULAR | Status: DC | PRN
  Administered 2018-01-12: 0.2 mg via INTRAVENOUS

## 2018-01-12 MED ORDER — LIDOCAINE HCL (PF) 1 % IJ SOLN
2.0000 mL | Freq: Once | INTRAMUSCULAR | Status: AC
  Administered 2018-01-12: 0.3 mL via INTRADERMAL

## 2018-01-12 MED ORDER — LIDOCAINE HCL (PF) 1 % IJ SOLN
INTRAMUSCULAR | Status: AC
  Administered 2018-01-12: 0.3 mL via INTRADERMAL
  Filled 2018-01-12: qty 2

## 2018-01-12 MED ORDER — MIDAZOLAM HCL 2 MG/2ML IJ SOLN
INTRAMUSCULAR | Status: AC
  Filled 2018-01-12: qty 2

## 2018-01-12 MED ORDER — PROPOFOL 10 MG/ML IV BOLUS
INTRAVENOUS | Status: DC | PRN
  Administered 2018-01-12: 20 mg via INTRAVENOUS
  Administered 2018-01-12: 30 mg via INTRAVENOUS

## 2018-01-12 MED ORDER — LIDOCAINE HCL (PF) 2 % IJ SOLN
INTRAMUSCULAR | Status: AC
  Filled 2018-01-12: qty 10

## 2018-01-12 MED ORDER — MIDAZOLAM HCL 2 MG/2ML IJ SOLN
INTRAMUSCULAR | Status: DC | PRN
  Administered 2018-01-12: 1 mg via INTRAVENOUS

## 2018-01-12 MED ORDER — PROPOFOL 500 MG/50ML IV EMUL
INTRAVENOUS | Status: AC
  Filled 2018-01-12: qty 50

## 2018-01-12 MED ORDER — FENTANYL CITRATE (PF) 100 MCG/2ML IJ SOLN
INTRAMUSCULAR | Status: DC | PRN
  Administered 2018-01-12: 25 ug via INTRAVENOUS

## 2018-01-12 NOTE — Transfer of Care (Signed)
Immediate Anesthesia Transfer of Care Note  Patient: [name]  Procedure(s) Performed: COLONOSCOPY WITH PROPOFOL (N/A ) ESOPHAGOGASTRODUODENOSCOPY (EGD) WITH PROPOFOL (N/A )  Patient Location: PACU and Endoscopy Unit  Anesthesia Type:General  Level of Consciousness: sedated  Airway & Oxygen Therapy: Patient Spontanous Breathing and Patient connected to nasal cannula oxygen  Post-op Assessment: Report given to RN and Post -op Vital signs reviewed and stable  Post vital signs: stable  Last Vitals:  Vitals Value Taken Time  BP 148/71 01/12/2018  9:20 AM  Temp 36.1 C 01/12/2018  9:20 AM  Pulse 65 01/12/2018  9:20 AM  Resp 16 01/12/2018  9:20 AM  SpO2 100 % 01/12/2018  9:20 AM    Last Pain:  Vitals:   01/12/18 0920  TempSrc: Tympanic  PainSc:          Complications: No apparent anesthesia complications

## 2018-01-12 NOTE — Op Note (Signed)
[facility] Gastroenterology Patient Name: [name] Procedure Date: 01/12/2018 8:30 AM MRN: [id number] Account #: [id number] Date of Birth: 11-23-1970 Admit Type: Outpatient Age: 47 Room: [facility] ENDO ROOM 3 Gender: Female Note Status: Finalized Procedure:            Upper GI endoscopy Indications:          Dysphagia, Abnormal UGI series Providers:            Benay Pike. Alice Reichert MD, MD Referring MD:         [name], MD (Referring MD) Medicines:            Propofol per Anesthesia Complications:        No immediate complications. Procedure:            Pre-Anesthesia Assessment:                       - The risks and benefits of the procedure and the                        sedation options and risks were discussed with the                        patient. All questions were answered and informed                        consent was obtained.                       - Patient identification and proposed procedure were                        verified prior to the procedure by the nurse. The                        procedure was verified in the procedure room.                       - ASA Grade Assessment: III - A patient with severe                        systemic disease.                       - After reviewing the risks and benefits, the patient                        was deemed in satisfactory condition to undergo the                        procedure.                       After obtaining informed consent, the endoscope was                        passed under direct vision. Throughout the procedure,                        the patient's blood pressure, pulse, and oxygen  saturations were monitored continuously. The Endoscope                        was introduced through the mouth, and advanced to the                        jejunum. The upper GI endoscopy was accomplished                        without difficulty. The patient tolerated the  procedure                        well. Findings:      No endoscopic abnormality was evident in the esophagus to explain the       patient's complaint of dysphagia. It was decided to NOT pursue       esophageal dilation.      Evidence of a Roux-en-Y gastrojejunostomy was found. The gastrojejunal       anastomosis was characterized by healthy appearing mucosa. This was       traversed. The pouch-to-jejunum limb was characterized by healthy       appearing mucosa. The duodenum-to-jejunum limb was not examined as it       could not be found.      The exam was otherwise without abnormality. Impression:           - No endoscopic esophageal abnormality to explain                        patient's dysphagia.                       - Roux-en-Y gastrojejunostomy with gastrojejunal                        anastomosis characterized by healthy appearing mucosa.                       - The examination was otherwise normal.                       - No specimens collected. Recommendation:       - Proceed with colonoscopy Procedure Code(s):    --- Professional ---                       [phone], Esophagogastroduodenoscopy, flexible, transoral;                        diagnostic, including collection of specimen(s) by                        brushing or washing, when performed (separate procedure) Diagnosis Code(s):    --- Professional ---                       R93.3, Abnormal findings on diagnostic imaging of other                        parts of digestive tract                       Z98.0, Intestinal bypass and anastomosis status  R13.10, Dysphagia, unspecified CPT copyright 2018 American Medical Association. All rights reserved. The codes documented in this report are preliminary and upon coder review may  be revised to meet current compliance requirements. Efrain Sella MD, MD 01/12/2018 8:52:33 AM This report has been signed electronically. Number of Addenda: 0 Note Initiated On:  01/12/2018 8:30 AM      [facility]

## 2018-01-12 NOTE — Anesthesia Preprocedure Evaluation (Signed)
Anesthesia Evaluation  Patient identified by MRN, date of birth, ID band Patient awake    Reviewed: Allergy & Precautions, H&P , NPO status , Patient's Chart, lab work & pertinent test results  Airway Mallampati: III       Dental  (+) Missing, Chipped, Poor Dentition   Pulmonary former smoker,           Cardiovascular hypertension, (-) angina+ CAD, + Past MI, + Cardiac Stents (2 stent, most recent 2 years ago) and +CHF  (-) CABG (-) dysrhythmias   Echo 03/19/17: - Left ventricle: The cavity size was normal. Systolic function was   normal. The estimated ejection fraction was in the range of 60%   to 65%. Wall motion was normal; there were no regional wall   motion abnormalities. Left ventricular diastolic function   parameters were normal. - Left atrium: The atrium was normal in size. - Right ventricle: Systolic function was normal. - Pulmonary arteries: Systolic pressure was within the normal   Range.  LHC 12/20/17: 1.  Patent first diagonal and mid LAD stent with no significant restenosis.  Significant disease affecting the distal LAD close to the apex as well as recurrent restenosis in the ostial second diagonal which was treated in the past with angioplasty most recently in August with cutting balloon angioplasty. 2.  Normal left ventricular end-diastolic pressure.  Left ventricular angiography was not performed.   Neuro/Psych  Headaches, PSYCHIATRIC DISORDERS Anxiety Depression Bipolar Disorder CVA    GI/Hepatic Neg liver ROS, PUD, GERD  ,  Endo/Other  diabetesHypothyroidism   Renal/GU CRFRenal disease  negative genitourinary   Musculoskeletal   Abdominal   Peds  Hematology  (+) Blood dyscrasia, anemia ,   Anesthesia Other Findings Past Medical History: No date: Anemia     Comment:  iron deficiency anemia No date: Aortic arch aneurysm (HCC) No date: Bipolar disorder ([place]) 2014: BRCA negative No date: CAD  (coronary artery disease)     Comment:  a. 08/2003 Cath: LAD 30-40-med Rx; b. 11/2014 PCI: LAD               32m(3.25x23 Xience Alpine DES); c. 06/2015 PCI: D1               (2.25x12 Resolute Integrity DES); d. 06/2017 PCI: Patent               mLAD stent, D2 95 (PTCA); e. 09/2017 Cath/PCI: LM nl, LAD               337m70d, D1 40, D2 99ost (CBA), LCX 40p, RCA 40ost/p. No date: CKD (chronic kidney disease), stage III (HCC) No date: Colon polyp No date: CVA (cerebral vascular accident) (HCAustin    Comment:  Left side weakness.  No date: Diabetes ([facility]Co[facility] CenterNo date: Family history of breast cancer     Comment:  BRCA neg 2014 04/27/2011: Gastric ulcer No date: History of echocardiogram     Comment:  a. 03/2017 Echo: EF 60-65%, no rwma. No date: HTN (hypertension) No date: Hyperlipemia No date: Hypothyroid No date: Malignant melanoma of skin of scalp (HCC) No date: MI, acute, non ST segment elevation (HCC) No date: Neuromuscular disorder (HCPost [place]05/03/2015: S/P drug eluting coronary stent placement 03/18/2017: [facility] Past Surgical History: No date: APPENDECTOMY 11/09/2014: CARDIAC CATHETERIZATION; N/A     Comment:  Procedure: Coronary Angiography;  Surgeon: TiMinna MerrittsMD;  Location:  [place] CV LAB;  Service:               Cardiovascular;  Laterality: N/A; 11/12/2014: CARDIAC CATHETERIZATION; N/A     Comment:  Procedure: Coronary Stent Intervention;  Surgeon:               Isaias Cowman, MD;  Location: [place] CV LAB;              Service: Cardiovascular;  Laterality: N/A; 04/18/2015: CARDIAC CATHETERIZATION; N/A     Comment:  Procedure: Left Heart Cath and Coronary Angiography;                Surgeon: Minna Merritts, MD;  Location: [place]               CV LAB;  Service: Cardiovascular;  Laterality: N/A; 06/04/2015: CARDIAC CATHETERIZATION; Left     Comment:  Procedure: Left Heart Cath and Coronary Angiography;                Surgeon: Wellington Hampshire, MD;  Location: [place]               CV LAB;  Service: Cardiovascular;  Laterality: Left; 06/04/2015: CARDIAC CATHETERIZATION; N/A     Comment:  Procedure: Coronary Stent Intervention;  Surgeon:               Wellington Hampshire, MD;  Location: [place] CV LAB;                Service: Cardiovascular;  Laterality: N/A; 2001: CESAREAN SECTION 11/18/2016: CHOLECYSTECTOMY; N/A     Comment:  Procedure: LAPAROSCOPIC CHOLECYSTECTOMY WITH               INTRAOPERATIVE CHOLANGIOGRAM;  Surgeon: Christene Lye, MD;  Location: [facility] ORS;  Service:               General;  Laterality: N/A; 04/27/2016: COLONOSCOPY WITH PROPOFOL; N/A     Comment:  Procedure: COLONOSCOPY WITH PROPOFOL;  Surgeon: [name], MD;  Location: [place];  Service:               Endoscopy;  Laterality: N/A; No date: CORONARY ANGIOPLASTY 06/29/2017: CORONARY BALLOON ANGIOPLASTY; N/A     Comment:  Procedure: CORONARY BALLOON ANGIOPLASTY;  Surgeon:               Wellington Hampshire, MD;  Location: [place] CV LAB;                Service: Cardiovascular;  Laterality: N/A; 09/20/2017: CORONARY BALLOON ANGIOPLASTY; N/A     Comment:  Procedure: CORONARY BALLOON ANGIOPLASTY;  Surgeon:               Wellington Hampshire, MD;  Location: [place] CV LAB;                Service: Cardiovascular;  Laterality: N/A; No date: DILATION AND CURETTAGE OF UTERUS 09/14/2014: ESOPHAGOGASTRODUODENOSCOPY (EGD) WITH PROPOFOL; N/A     Comment:  Procedure: ESOPHAGOGASTRODUODENOSCOPY (EGD) WITH               PROPOFOL;  Surgeon: Josefine Class, MD;  Location[facility]               [facility] ENDOSCOPY;  Service: Endoscopy;  Laterality: N/A; 04/27/2016: ESOPHAGOGASTRODUODENOSCOPY (EGD)  WITH PROPOFOL; N/A     Comment:  Procedure: ESOPHAGOGASTRODUODENOSCOPY (EGD) WITH               PROPOFOL;  Surgeon: [name], MD;  Location: [place];  Service: Endoscopy;  Laterality: N/A;                 Diabetic - oral meds 09/2009: GASTRIC BYPASS     Comment:  [facility]  No date: Left Carotid to sublcavian artery bypass w/ subclavian  artery ligation     Comment:  a. Performed @ [facility]. 06/29/2017: LEFT HEART CATH AND CORONARY ANGIOGRAPHY; Left     Comment:  Procedure: LEFT HEART CATH AND CORONARY ANGIOGRAPHY;                Surgeon: Wellington Hampshire, MD;  Location: [place]               CV LAB;  Service: Cardiovascular;  Laterality: Left; 09/20/2017: LEFT HEART CATH AND CORONARY ANGIOGRAPHY; N/A     Comment:  Procedure: LEFT HEART CATH AND CORONARY ANGIOGRAPHY;                Surgeon: Wellington Hampshire, MD;  Location: [place]               CV LAB;  Service: Cardiovascular;  Laterality: N/A; 12/20/2017: LEFT HEART CATH AND CORONARY ANGIOGRAPHY; N/A     Comment:  Procedure: LEFT HEART CATH AND CORONARY ANGIOGRAPHY;                Surgeon: Wellington Hampshire, MD;  Location: [place]               CV LAB;  Service: Cardiovascular;  Laterality: N/A; 2016: MELANOMA EXCISION     Comment:  Dr. Evorn Gong 2002: NOVASURE ABLATION No date: RIGHT OOPHORECTOMY 01/07/2016: SHOULDER ARTHROSCOPY WITH OPEN ROTATOR CUFF REPAIR; Right     Comment:  Procedure: SHOULDER ARTHROSCOPY WITH DEBRIDMENT,               SUBACHROMIAL DECOMPRESSION;  Surgeon: Corky Mull, MD;                Location: [facility] ORS;  Service: Orthopedics;  Laterality:               Right; 03/16/2017: SHOULDER ARTHROSCOPY WITH OPEN ROTATOR CUFF REPAIR; Right     Comment:  Procedure: SHOULDER ARTHROSCOPY WITH OPEN ROTATOR CUFF               REPAIR POSSIBLE BICEPS TENODESIS;  Surgeon: Corky Mull, MD;  Location: [facility] ORS;  Service: Orthopedics;                Laterality: Right; No date: TRIGGER FINGER RELEASE; Right     Comment:   [place]  BMI    Body Mass Index:  31.45 kg/m      Reproductive/Obstetrics negative OB ROS                              Anesthesia Physical Anesthesia Plan  ASA: III  Anesthesia Plan: General   Post-op Pain Management:    Induction:   PONV Risk Score and Plan: Propofol infusion and TIVA  Airway Management Planned: Natural Airway and  Nasal Cannula  Additional Equipment:   Intra-op Plan:   Post-operative Plan:   Informed Consent: I have reviewed the patients History and Physical, chart, labs and discussed the procedure including the risks, benefits and alternatives for the proposed anesthesia with the patient or authorized representative who has indicated his/her understanding and acceptance.   Dental Advisory Given  Plan Discussed with: Anesthesiologist, CRNA and Surgeon  Anesthesia Plan Comments:         Anesthesia Quick Evaluation

## 2018-01-12 NOTE — Anesthesia Post-op Follow-up Note (Signed)
Anesthesia QCDR form completed.        

## 2018-01-12 NOTE — Op Note (Signed)
[facility] Gastroenterology Patient Name: [name] Procedure Date: 01/12/2018 8:29 AM MRN: [id number] Account #: [id number] Date of Birth: Jul 31, 1970 Admit Type: Outpatient Age: 47 Room: [facility] ENDO ROOM 3 Gender: Female Note Status: Finalized Procedure:            Colonoscopy Indications:          Functional diarrhea Providers:            Benay Pike. Alice Reichert MD, MD Referring MD:         Kirstie Peri. Caryn Section, MD (Referring MD) Medicines:            Propofol per Anesthesia Complications:        No immediate complications. Procedure:            Pre-Anesthesia Assessment:                       - The risks and benefits of the procedure and the                        sedation options and risks were discussed with the                        patient. All questions were answered and informed                        consent was obtained.                       - Patient identification and proposed procedure were                        verified prior to the procedure by the nurse. The                        procedure was verified in the procedure room.                       - ASA Grade Assessment: III - A patient with severe                        systemic disease.                       - After reviewing the risks and benefits, the patient                        was deemed in satisfactory condition to undergo the                        procedure.                       After obtaining informed consent, the colonoscope was                        passed under direct vision. Throughout the procedure,                        the patient's blood pressure, pulse, and oxygen  saturations were monitored continuously. The                        Colonoscope was introduced through the anus and                        advanced to the the cecum, identified by appendiceal                        orifice and ileocecal valve. The colonoscopy was   performed without difficulty. The patient tolerated the                        procedure well. The quality of the bowel preparation                        was adequate. The ileocecal valve, appendiceal orifice,                        and rectum were photographed. Findings:      The perianal and digital rectal examinations were normal. Pertinent       negatives include normal sphincter tone and no palpable rectal lesions.      The colon (entire examined portion) appeared normal.      The entire examined colon appeared normal on direct and retroflexion       views.      Biopsies for histology were taken with a cold forceps from the random       colon for evaluation of microscopic colitis. Impression:           - The entire examined colon is normal.                       - The entire examined colon is normal on direct and                        retroflexion views.                       - Biopsies were taken with a cold forceps from the                        random colon for evaluation of microscopic colitis. Recommendation:       - Patient has a contact number available for                        emergencies. The signs and symptoms of potential                        delayed complications were discussed with the patient.                        Return to normal activities tomorrow. Written discharge                        instructions were provided to the patient.                       - Resume previous diet.                       -  Continue present medications.                       - Await pathology results.                       - Return to physician assistant in 3 months.                       - The findings and recommendations were discussed with                        the patient and their family. Procedure Code(s):    --- Professional ---                       [phone], Colonoscopy, flexible; with biopsy, single or                        multiple Diagnosis Code(s):    --- Professional  ---                       K59.1, Functional diarrhea CPT copyright 2018 American Medical Association. All rights reserved. The codes documented in this report are preliminary and upon coder review may  be revised to meet current compliance requirements. Efrain Sella MD, MD 01/12/2018 9:10:20 AM This report has been signed electronically. Number of Addenda: 0 Note Initiated On: 01/12/2018 8:29 AM Scope Withdrawal Time: 0 hours 5 minutes 35 seconds  Total Procedure Duration: 0 hours 9 minutes 44 seconds       [facility]

## 2018-01-12 NOTE — Interval H&P Note (Signed)
History and Physical Interval Note:  01/12/2018 8:26 AM  [name]  has presented today for surgery, with the diagnosis of DIARRHEA ABD PAIN  GERD  The various methods of treatment have been discussed with the patient and family. After consideration of risks, benefits and other options for treatment, the patient has consented to  Procedure(s): COLONOSCOPY WITH PROPOFOL (N/A) ESOPHAGOGASTRODUODENOSCOPY (EGD) WITH PROPOFOL (N/A) as a surgical intervention .  The patient's history has been reviewed, patient examined, no change in status, stable for surgery.  I have reviewed the patient's chart and labs.  Questions were answered to the patient's satisfaction.     [place], [place]

## 2018-01-12 NOTE — H&P (Signed)
Outpatient short stay form Pre-procedure 01/12/2018 8:22 AM [facility] K. Alice Reichert, M.D.  Primary Physician: [name], M.D.  Reason for visit:  Functional diarrhea,   History of present illness: 47 year old female with a history of coronary artery disease test with stent placement as well as a history of cerebrovascular accident presents for oropharyngeal dysphagia symptoms.  Recent barium swallow revealed esophageal web obscuring about one third of the circumference of the cervical esophagus.  EGD is planned.  Patient has chronic diarrhea and a work-up including serum inflammatory markers as well as a celiac antibody panel were negative.  She presents for diagnostic colonoscopy as well.    Current Facility-Administered Medications:  .  lidocaine (PF) (XYLOCAINE) 1 % injection, , , ,  .  0.9 %  sodium chloride infusion, , Intravenous, Continuous, Brittney Mucha K, MD .  lidocaine (PF) (XYLOCAINE) 1 % injection 2 mL, 2 mL, Intradermal, Once, [place], Benay Pike, MD  Medications Prior to Admission  Medication Sig Dispense Refill Last Dose  . pioglitazone (ACTOS) 15 MG tablet Take 15 mg by mouth daily.     Marland Kitchen ALPRAZolam (XANAX) 1 MG tablet TAKE 1 TABLET (1 MG TOTAL) BY MOUTH DAILY AS NEEDED FOR ANXIETY. 30 tablet 5 Taking  . amLODipine (NORVASC) 5 MG tablet Take 1 tablet (5 mg total) by mouth daily. 90 tablet 6 Taking  . aspirin 81 MG chewable tablet Chew 1 tablet (81 mg total) by mouth daily. (Patient taking differently: Chew 324 mg by mouth daily. ) 30 tablet 11 Taking  . Blood Glucose Monitoring Suppl (ONE TOUCH ULTRA 2) w/Device KIT Use to check blood sugar once a day. Dx. E11.9 1 each 0 Taking  . buPROPion (WELLBUTRIN XL) 150 MG 24 hr tablet TAKE 1 TABLET BY MOUTH DAILY 30 tablet 11 01/12/2018 at 0600  . citalopram (CELEXA) 40 MG tablet Take 1 tablet (40 mg total) by mouth daily. 30 tablet 5 01/12/2018 at 0600  . clopidogrel (PLAVIX) 75 MG tablet Take 1 tablet (75 mg total) by mouth daily  with breakfast. (Patient not taking: Reported on 01/05/2018) 30 tablet 2 Not Taking  . Cyanocobalamin (VITAMIN B 12 PO) Take 1,000 mcg by mouth daily.    Taking  . gabapentin (NEURONTIN) 300 MG capsule TAKE 1 CAPSULE BY MOUTH TWICE A DAY (Patient taking differently: Take 300 mg by mouth 2 (two) times daily. ) 60 capsule 5 Taking  . hyoscyamine (LEVSIN SL) 0.125 MG SL tablet Place 1 tablet (0.125 mg total) under the tongue every 6 (six) hours as needed. (Patient taking differently: Place 0.125 mg under the tongue every 6 (six) hours as needed for cramping. ) 60 tablet 0 Taking  . isosorbide mononitrate (IMDUR) 120 MG 24 hr tablet Take 1 tablet (120 mg total) by mouth daily. 90 tablet 1 01/12/2018 at 0600  . levothyroxine (SYNTHROID, LEVOTHROID) 25 MCG tablet Take 1 tablet (25 mcg total) daily before breakfast by mouth. 30 tablet 12 01/12/2018 at 0600  . losartan (COZAAR) 25 MG tablet Take 1/2 tablet (12.5 mg) by mouth once daily (Patient taking differently: Take 25 mg by mouth daily. Take 1/2 tablet (12.5 mg) by mouth once daily) 45 tablet 1 Taking  . metoprolol succinate (TOPROL-XL) 50 MG 24 hr tablet Take 1 tablet (50 mg total) by mouth daily. Take with or immediately following a meal. 30 tablet 3 01/12/2018 at 0600  . nitroGLYCERIN (NITROSTAT) 0.4 MG SL tablet Place 1 tablet (0.4 mg total) under the tongue every 5 (five) minutes as needed  for chest pain. 25 tablet 2 Taking  . pantoprazole (PROTONIX) 40 MG tablet Take 1 tablet (40 mg total) by mouth 2 (two) times daily. 60 tablet 5 Taking  . promethazine (PHENERGAN) 25 MG tablet Take 1 tablet (25 mg total) by mouth every 6 (six) hours as needed for nausea or vomiting. 30 tablet 3 Taking  . pyridostigmine (MESTINON) 60 MG tablet Take 60 mg by mouth 3 (three) times daily.    Taking  . rosuvastatin (CRESTOR) 10 MG tablet Take 1 tablet (10 mg total) by mouth daily. 30 tablet 11 Taking     Allergies  Allergen Reactions  . Lipitor [Atorvastatin] Other  (See Comments)    Leg pains  . Tramadol Other (See Comments)    Mouth feels like it's on fire     Past Medical History:  Diagnosis Date  . Anemia    iron deficiency anemia  . Aortic arch aneurysm ([place])   . Bipolar disorder ([place])   . BRCA negative 2014  . CAD (coronary artery disease)    a. 08/2003 Cath: LAD 30-40-med Rx; b. 11/2014 PCI: LAD 7m(3.25x23 Xience Alpine DES); c. 06/2015 PCI: D1 (2.25x12 Resolute Integrity DES); d. 06/2017 PCI: Patent mLAD stent, D2 95 (PTCA); e. 09/2017 Cath/PCI: LM nl, LAD 373m70d, D1 40, D2 99ost (CBA), LCX 40p, RCA 40ost/p.  . Marland KitchenKD (chronic kidney disease), stage III (HCTerlingua  . Colon polyp   . CVA (cerebral vascular accident) (HCReid [place]   Left side weakness.   . Diabetes (HCIowa Park  . Family history of breast cancer    BRCA neg 2014  . Gastric ulcer 04/27/2011  . History of echocardiogram    a. 03/2017 Echo: EF 60-65%, no rwma.  . Marland KitchenTN (hypertension)   . Hyperlipemia   . Hypothyroid   . Malignant melanoma of skin of scalp (HCGrayland  . MI, acute, non ST segment elevation (HCMontrose  . Neuromuscular disorder (HCWyola  . S/P drug eluting coronary stent placement 06/04/2015  . Sepsis (HCWarr Acres2/14/2019    Review of systems:  Otherwise negative.    Physical Exam  Gen: Alert, oriented. Appears stated age.  HEENT: [place]/AT. PERRLA. Lungs: CTA, no wheezes. CV: RR nl S1, S2. Abd: soft, benign, no masses. BS+ Ext: No edema. Pulses 2+    Planned procedures: Proceed with EGD and colonoscopy. The patient understands the nature of the planned procedure, indications, risks, alternatives and potential complications including but not limited to bleeding, infection, perforation, damage to internal organs and possible oversedation/side effects from anesthesia. The patient agrees and gives consent to proceed.  Please refer to procedure notes for findings, recommendations and patient disposition/instructions.     Elva Breaker K. ToAlice ReichertM.D. Gastroenterology 01/12/2018  8:22  AM

## 2018-01-13 ENCOUNTER — Encounter: Payer: Self-pay | Admitting: Internal Medicine

## 2018-01-13 LAB — SURGICAL PATHOLOGY

## 2018-01-13 NOTE — Anesthesia Postprocedure Evaluation (Signed)
Anesthesia Post Note  Patient: Alga Southall  Procedure(s) Performed: COLONOSCOPY WITH PROPOFOL (N/A ) ESOPHAGOGASTRODUODENOSCOPY (EGD) WITH PROPOFOL (N/A )  Patient location during evaluation: PACU Anesthesia Type: General Level of consciousness: awake and alert Pain management: pain level controlled Vital Signs Assessment: post-procedure vital signs reviewed and stable Respiratory status: spontaneous breathing, nonlabored ventilation, respiratory function stable and patient connected to nasal cannula oxygen Cardiovascular status: blood pressure returned to baseline and stable Postop Assessment: no apparent nausea or vomiting Anesthetic complications: no     Last Vitals:  Vitals:   01/12/18 0930 01/12/18 0940  BP: (!) 147/76 (!) 162/73  Pulse: 65 61  Resp: 16 15  Temp:    SpO2: 99% 99%    Last Pain:  Vitals:   01/13/18 0758  TempSrc:   PainSc: 0-No pain                 Durenda Hurt

## 2018-02-01 ENCOUNTER — Encounter: Payer: Self-pay | Admitting: Internal Medicine

## 2018-02-01 DIAGNOSIS — H52229 Regular astigmatism, unspecified eye: Secondary | ICD-10-CM | POA: Diagnosis not present

## 2018-02-01 DIAGNOSIS — H25019 Cortical age-related cataract, unspecified eye: Secondary | ICD-10-CM | POA: Diagnosis not present

## 2018-02-01 DIAGNOSIS — H5203 Hypermetropia, bilateral: Secondary | ICD-10-CM | POA: Diagnosis not present

## 2018-02-05 IMAGING — US US RENAL
1 series · 14 of 25 positions shown · non-contrast
Comparison: CT 10/31/2015.

CLINICAL DATA: Hydronephrosis.  UTI.

EXAM:
RENAL / URINARY TRACT ULTRASOUND COMPLETE

[Series 1: us renal · 0.25mm/px · 14 of 33 slices shown]
[im 1/33]
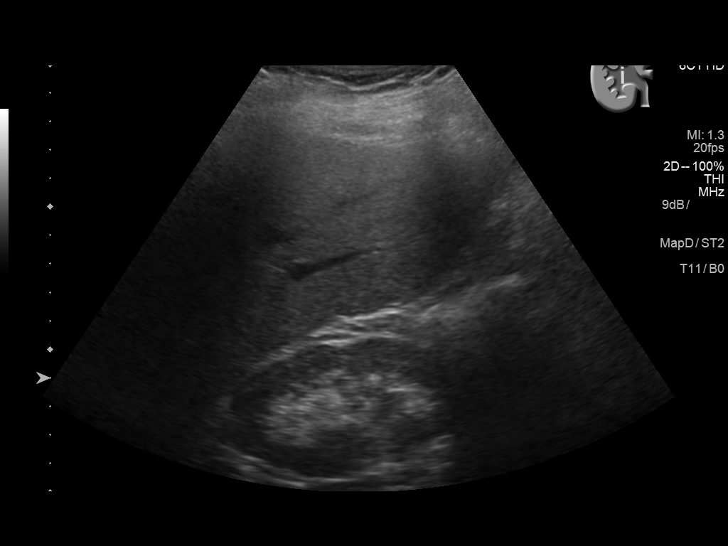
[im 3/33]
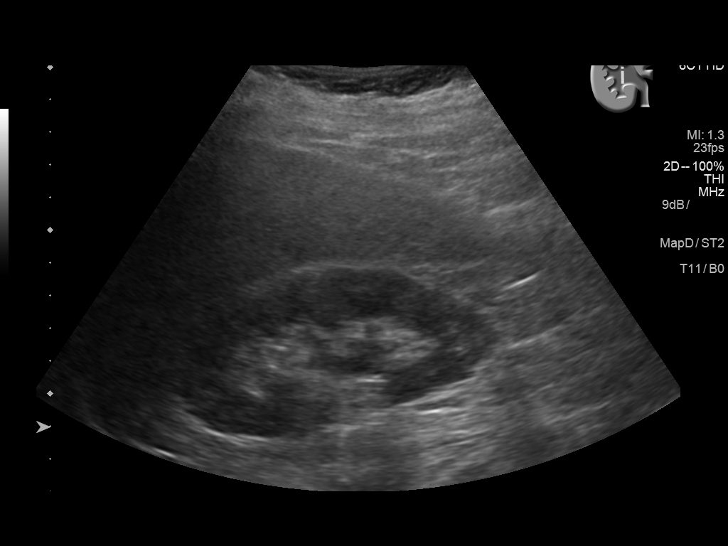
[im 6/33]
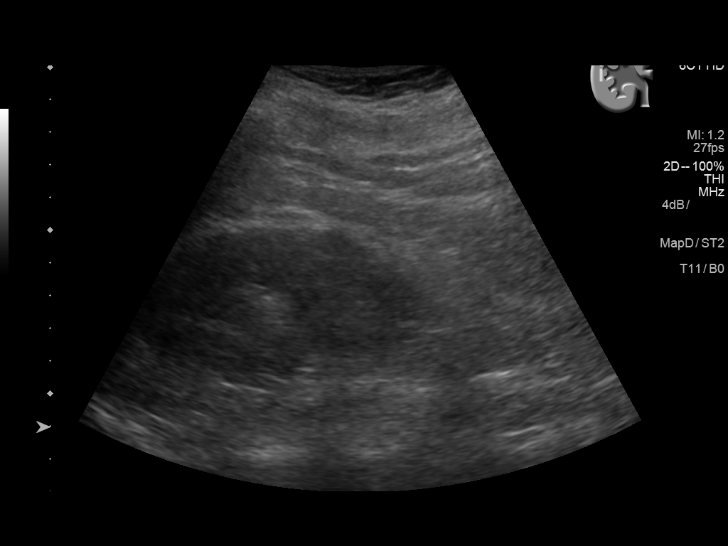
[im 9/33]
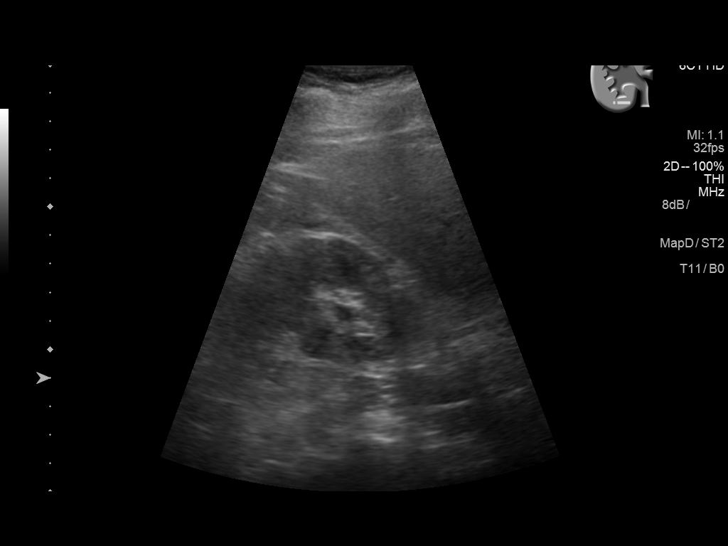
[im 11/33]
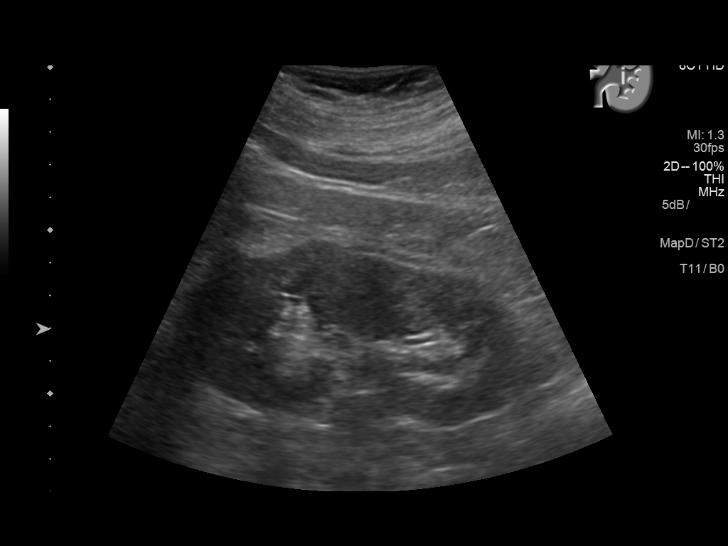
[im 13/33]
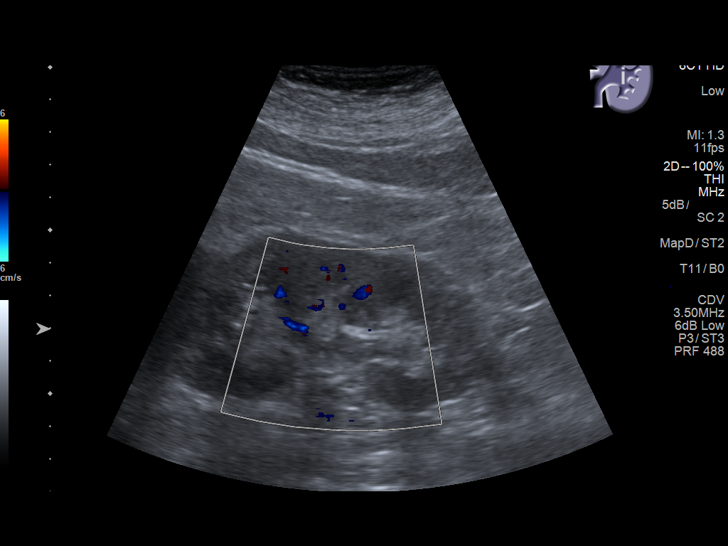
[im 15/33]
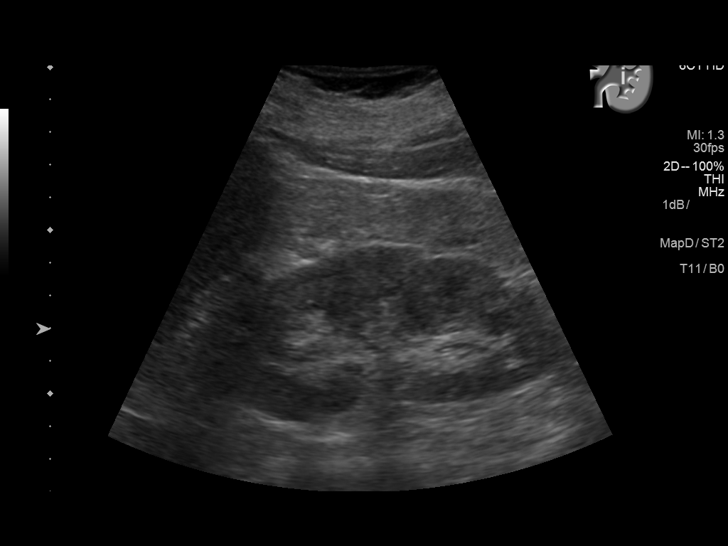
[im 18/33]
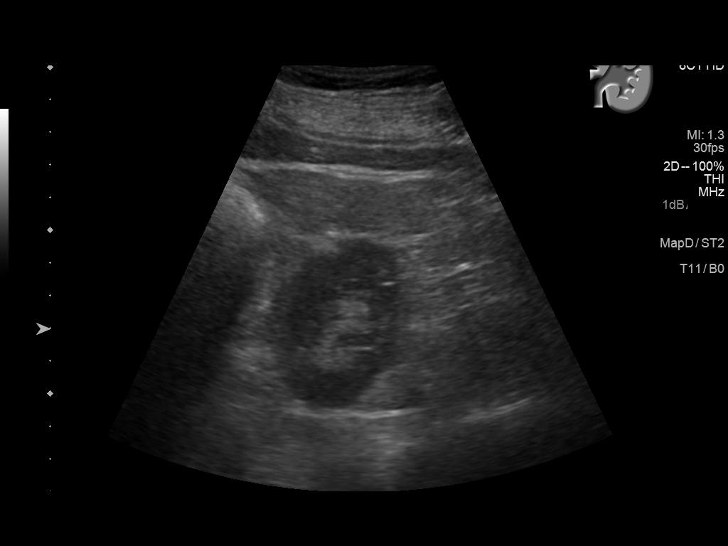
[im 21/33]
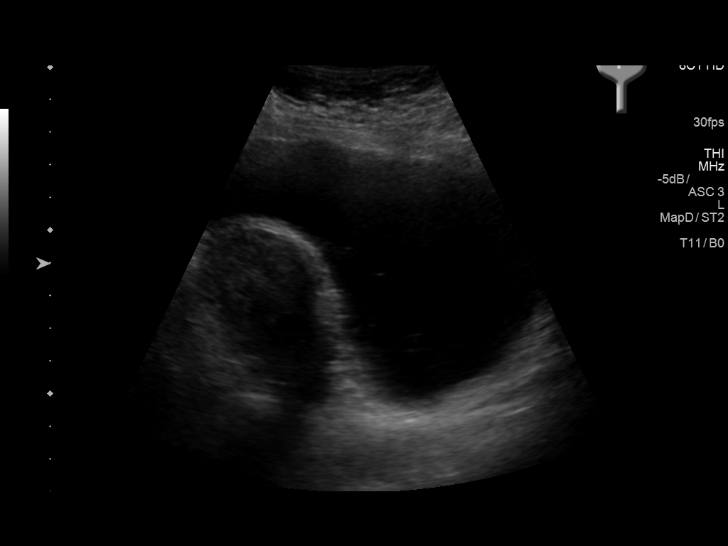
[im 22/33]
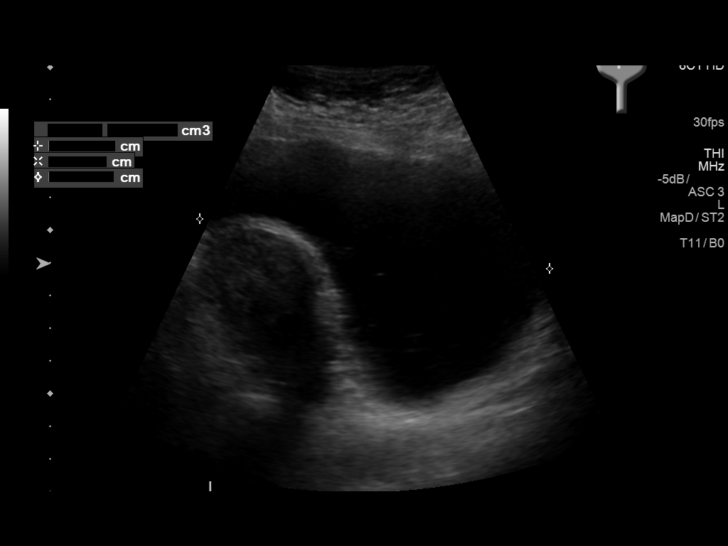
[im 25/33]
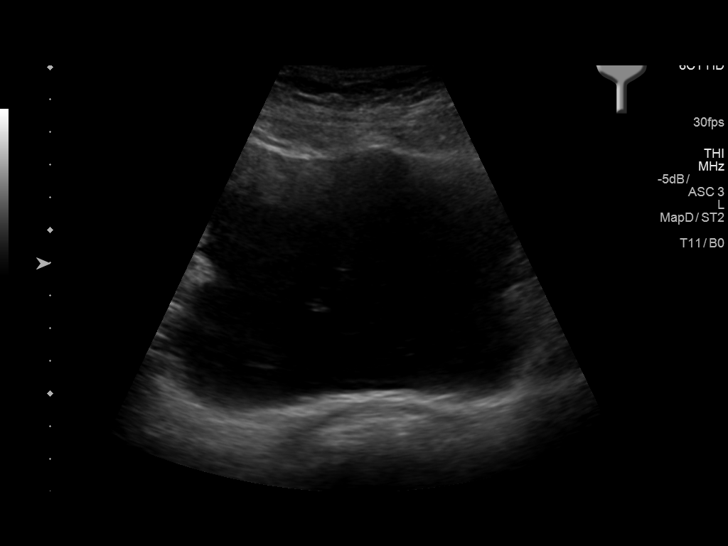
[im 27/33]
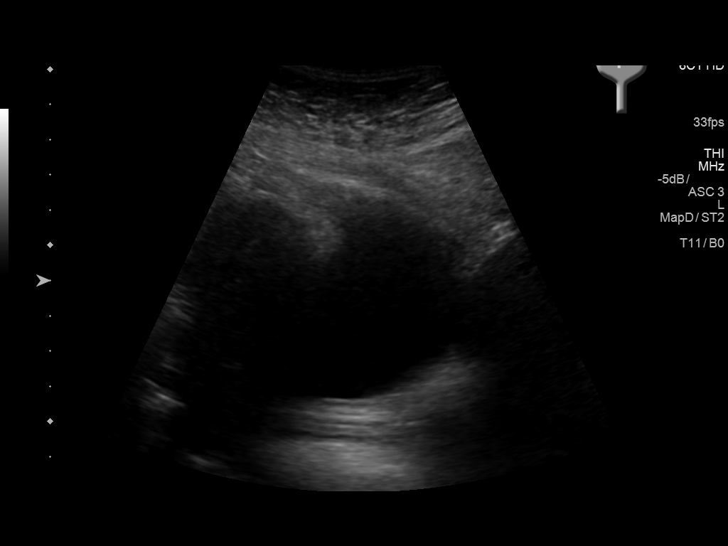
[im 30/33]
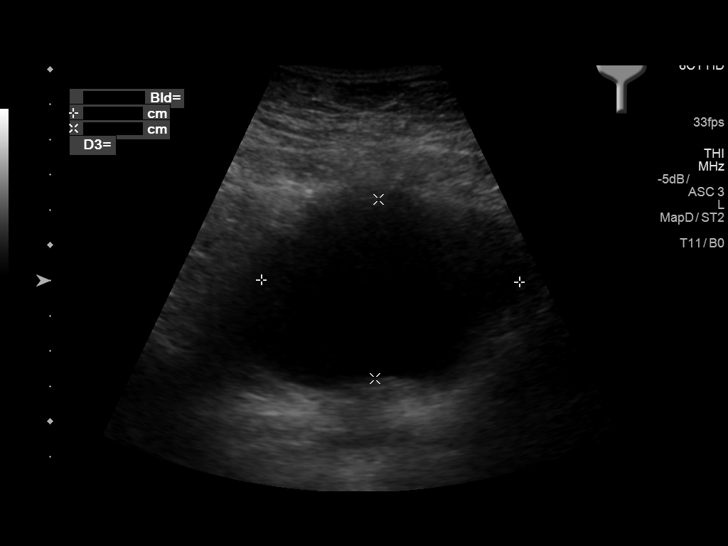
[im 33/33]
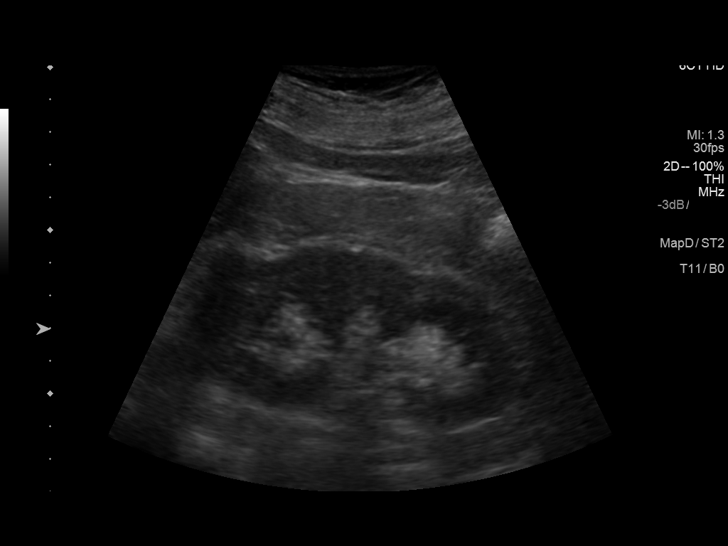

[14 of 25 positions shown; findings below may reference images not displayed]

FINDINGS: Right Kidney:

Length: 9.9 cm. Echogenicity within normal limits. No mass. Minimal
right mid renal pelvic fullness. No prominent hydronephrosis.

Left Kidney:

Length: 10.3 cm. Echogenicity within normal limits. No mass. No
hydronephrosis visualized on today's exam.

Bladder:

Small amount of debris noted within the bladder consistent patient's
history of UTI.
IMPRESSION: 1. Minimal right renal pelvic fullness. No prominent hydronephrosis.
No hydronephrosis noted of the left kidney on today's exam.

2. Small amount of debris noted the bladder consistent patient's
history of UTI .

## 2018-02-07 DIAGNOSIS — M5136 Other intervertebral disc degeneration, lumbar region: Secondary | ICD-10-CM | POA: Diagnosis not present

## 2018-02-07 DIAGNOSIS — M5416 Radiculopathy, lumbar region: Secondary | ICD-10-CM | POA: Diagnosis not present

## 2018-02-14 DIAGNOSIS — Z9884 Bariatric surgery status: Secondary | ICD-10-CM | POA: Diagnosis not present

## 2018-02-14 DIAGNOSIS — I129 Hypertensive chronic kidney disease with stage 1 through stage 4 chronic kidney disease, or unspecified chronic kidney disease: Secondary | ICD-10-CM | POA: Diagnosis not present

## 2018-02-14 DIAGNOSIS — E669 Obesity, unspecified: Secondary | ICD-10-CM | POA: Diagnosis not present

## 2018-02-14 DIAGNOSIS — E1142 Type 2 diabetes mellitus with diabetic polyneuropathy: Secondary | ICD-10-CM | POA: Diagnosis not present

## 2018-02-14 DIAGNOSIS — I10 Essential (primary) hypertension: Secondary | ICD-10-CM | POA: Diagnosis not present

## 2018-02-14 DIAGNOSIS — E039 Hypothyroidism, unspecified: Secondary | ICD-10-CM | POA: Diagnosis not present

## 2018-02-14 DIAGNOSIS — E1122 Type 2 diabetes mellitus with diabetic chronic kidney disease: Secondary | ICD-10-CM | POA: Diagnosis not present

## 2018-02-14 DIAGNOSIS — E782 Mixed hyperlipidemia: Secondary | ICD-10-CM | POA: Diagnosis not present

## 2018-02-14 DIAGNOSIS — N183 Chronic kidney disease, stage 3 (moderate): Secondary | ICD-10-CM | POA: Diagnosis not present

## 2018-02-17 ENCOUNTER — Inpatient Hospital Stay
Admission: EM | Admit: 2018-02-17 | Discharge: 2018-02-19 | DRG: 065 | Disposition: A | Discharge status: Home Health | Payer: Medicare Other | Attending: Specialist | Admitting: Specialist

## 2018-02-17 ENCOUNTER — Inpatient Hospital Stay: Payer: Medicare Other

## 2018-02-17 ENCOUNTER — Other Ambulatory Visit: Payer: Self-pay

## 2018-02-17 ENCOUNTER — Emergency Department: Payer: Medicare Other

## 2018-02-17 ENCOUNTER — Encounter: Payer: Self-pay | Admitting: Emergency Medicine

## 2018-02-17 DIAGNOSIS — G8191 Hemiplegia, unspecified affecting right dominant side: Secondary | ICD-10-CM | POA: Diagnosis present

## 2018-02-17 DIAGNOSIS — R531 Weakness: Secondary | ICD-10-CM | POA: Diagnosis not present

## 2018-02-17 DIAGNOSIS — I10 Essential (primary) hypertension: Secondary | ICD-10-CM | POA: Diagnosis not present

## 2018-02-17 DIAGNOSIS — E1122 Type 2 diabetes mellitus with diabetic chronic kidney disease: Secondary | ICD-10-CM | POA: Diagnosis present

## 2018-02-17 DIAGNOSIS — R29702 NIHSS score 2: Secondary | ICD-10-CM | POA: Diagnosis present

## 2018-02-17 DIAGNOSIS — Z87891 Personal history of nicotine dependence: Secondary | ICD-10-CM

## 2018-02-17 DIAGNOSIS — E1140 Type 2 diabetes mellitus with diabetic neuropathy, unspecified: Secondary | ICD-10-CM | POA: Diagnosis present

## 2018-02-17 DIAGNOSIS — Z841 Family history of disorders of kidney and ureter: Secondary | ICD-10-CM

## 2018-02-17 DIAGNOSIS — Z8619 Personal history of other infectious and parasitic diseases: Secondary | ICD-10-CM

## 2018-02-17 DIAGNOSIS — I6622 Occlusion and stenosis of left posterior cerebral artery: Secondary | ICD-10-CM | POA: Diagnosis not present

## 2018-02-17 DIAGNOSIS — Z79899 Other long term (current) drug therapy: Secondary | ICD-10-CM

## 2018-02-17 DIAGNOSIS — Z818 Family history of other mental and behavioral disorders: Secondary | ICD-10-CM

## 2018-02-17 DIAGNOSIS — Z955 Presence of coronary angioplasty implant and graft: Secondary | ICD-10-CM | POA: Diagnosis not present

## 2018-02-17 DIAGNOSIS — R4781 Slurred speech: Secondary | ICD-10-CM | POA: Diagnosis present

## 2018-02-17 DIAGNOSIS — E785 Hyperlipidemia, unspecified: Secondary | ICD-10-CM | POA: Diagnosis present

## 2018-02-17 DIAGNOSIS — I252 Old myocardial infarction: Secondary | ICD-10-CM

## 2018-02-17 DIAGNOSIS — I5022 Chronic systolic (congestive) heart failure: Secondary | ICD-10-CM | POA: Diagnosis present

## 2018-02-17 DIAGNOSIS — I69354 Hemiplegia and hemiparesis following cerebral infarction affecting left non-dominant side: Secondary | ICD-10-CM | POA: Diagnosis not present

## 2018-02-17 DIAGNOSIS — R29703 NIHSS score 3: Secondary | ICD-10-CM | POA: Diagnosis present

## 2018-02-17 DIAGNOSIS — K219 Gastro-esophageal reflux disease without esophagitis: Secondary | ICD-10-CM | POA: Diagnosis present

## 2018-02-17 DIAGNOSIS — Z800 Family history of malignant neoplasm of digestive organs: Secondary | ICD-10-CM

## 2018-02-17 DIAGNOSIS — I255 Ischemic cardiomyopathy: Secondary | ICD-10-CM | POA: Diagnosis present

## 2018-02-17 DIAGNOSIS — Z823 Family history of stroke: Secondary | ICD-10-CM

## 2018-02-17 DIAGNOSIS — F419 Anxiety disorder, unspecified: Secondary | ICD-10-CM | POA: Diagnosis present

## 2018-02-17 DIAGNOSIS — Z7902 Long term (current) use of antithrombotics/antiplatelets: Secondary | ICD-10-CM

## 2018-02-17 DIAGNOSIS — I6350 Cerebral infarction due to unspecified occlusion or stenosis of unspecified cerebral artery: Secondary | ICD-10-CM | POA: Diagnosis not present

## 2018-02-17 DIAGNOSIS — I639 Cerebral infarction, unspecified: Secondary | ICD-10-CM | POA: Diagnosis not present

## 2018-02-17 DIAGNOSIS — Z803 Family history of malignant neoplasm of breast: Secondary | ICD-10-CM

## 2018-02-17 DIAGNOSIS — Z8249 Family history of ischemic heart disease and other diseases of the circulatory system: Secondary | ICD-10-CM

## 2018-02-17 DIAGNOSIS — Z86010 Personal history of colonic polyps: Secondary | ICD-10-CM

## 2018-02-17 DIAGNOSIS — I130 Hypertensive heart and chronic kidney disease with heart failure and stage 1 through stage 4 chronic kidney disease, or unspecified chronic kidney disease: Secondary | ICD-10-CM | POA: Diagnosis present

## 2018-02-17 DIAGNOSIS — Z79890 Hormone replacement therapy (postmenopausal): Secondary | ICD-10-CM

## 2018-02-17 DIAGNOSIS — I6389 Other cerebral infarction: Secondary | ICD-10-CM | POA: Diagnosis present

## 2018-02-17 DIAGNOSIS — Z9884 Bariatric surgery status: Secondary | ICD-10-CM

## 2018-02-17 DIAGNOSIS — G35 Multiple sclerosis: Secondary | ICD-10-CM | POA: Diagnosis present

## 2018-02-17 DIAGNOSIS — R4789 Other speech disturbances: Secondary | ICD-10-CM | POA: Diagnosis not present

## 2018-02-17 DIAGNOSIS — E039 Hypothyroidism, unspecified: Secondary | ICD-10-CM | POA: Diagnosis present

## 2018-02-17 DIAGNOSIS — F319 Bipolar disorder, unspecified: Secondary | ICD-10-CM | POA: Diagnosis present

## 2018-02-17 DIAGNOSIS — Z7984 Long term (current) use of oral hypoglycemic drugs: Secondary | ICD-10-CM

## 2018-02-17 DIAGNOSIS — Z9049 Acquired absence of other specified parts of digestive tract: Secondary | ICD-10-CM | POA: Diagnosis not present

## 2018-02-17 DIAGNOSIS — Z808 Family history of malignant neoplasm of other organs or systems: Secondary | ICD-10-CM

## 2018-02-17 DIAGNOSIS — N183 Chronic kidney disease, stage 3 (moderate): Secondary | ICD-10-CM | POA: Diagnosis present

## 2018-02-17 DIAGNOSIS — Z85820 Personal history of malignant melanoma of skin: Secondary | ICD-10-CM

## 2018-02-17 DIAGNOSIS — Z8349 Family history of other endocrine, nutritional and metabolic diseases: Secondary | ICD-10-CM

## 2018-02-17 DIAGNOSIS — Z90721 Acquired absence of ovaries, unilateral: Secondary | ICD-10-CM | POA: Diagnosis not present

## 2018-02-17 DIAGNOSIS — Z9114 Patient's other noncompliance with medication regimen: Secondary | ICD-10-CM | POA: Diagnosis not present

## 2018-02-17 DIAGNOSIS — Z833 Family history of diabetes mellitus: Secondary | ICD-10-CM

## 2018-02-17 DIAGNOSIS — I2510 Atherosclerotic heart disease of native coronary artery without angina pectoris: Secondary | ICD-10-CM | POA: Diagnosis present

## 2018-02-17 DIAGNOSIS — Z8041 Family history of malignant neoplasm of ovary: Secondary | ICD-10-CM

## 2018-02-17 DIAGNOSIS — Z8711 Personal history of peptic ulcer disease: Secondary | ICD-10-CM

## 2018-02-17 DIAGNOSIS — Z7982 Long term (current) use of aspirin: Secondary | ICD-10-CM

## 2018-02-17 LAB — CBC
HCT: 34.7 % — ABNORMAL LOW (ref 36.0–46.0)
Hemoglobin: 11.1 g/dL — ABNORMAL LOW (ref 12.0–15.0)
MCH: 26.1 pg (ref 26.0–34.0)
MCHC: 32 g/dL (ref 30.0–36.0)
MCV: 81.6 fL (ref 80.0–100.0)
Platelets: 196 10*3/uL (ref 150–400)
RBC: 4.25 MIL/uL (ref 3.87–5.11)
RDW: 13.9 % (ref 11.5–15.5)
WBC: 8.6 10*3/uL (ref 4.0–10.5)
nRBC: 0 % (ref 0.0–0.2)

## 2018-02-17 LAB — APTT: aPTT: 30 seconds (ref 24–36)

## 2018-02-17 LAB — DIFFERENTIAL
Abs Immature Granulocytes: 0.06 10*3/uL (ref 0.00–0.07)
Basophils Absolute: 0.1 10*3/uL (ref 0.0–0.1)
Basophils Relative: 1 %
EOS PCT: 3 %
Eosinophils Absolute: 0.3 10*3/uL (ref 0.0–0.5)
Immature Granulocytes: 1 %
Lymphocytes Relative: 18 %
Lymphs Abs: 1.5 10*3/uL (ref 0.7–4.0)
Monocytes Absolute: 0.5 10*3/uL (ref 0.1–1.0)
Monocytes Relative: 6 %
Neutro Abs: 6.2 10*3/uL (ref 1.7–7.7)
Neutrophils Relative %: 71 %

## 2018-02-17 LAB — COMPREHENSIVE METABOLIC PANEL
ALT: 17 U/L (ref 0–44)
ANION GAP: 7 (ref 5–15)
AST: 19 U/L (ref 15–41)
Albumin: 3.6 g/dL (ref 3.5–5.0)
Alkaline Phosphatase: 128 U/L — ABNORMAL HIGH (ref 38–126)
BUN: 22 mg/dL — ABNORMAL HIGH (ref 6–20)
CO2: 18 mmol/L — ABNORMAL LOW (ref 22–32)
Calcium: 8.5 mg/dL — ABNORMAL LOW (ref 8.9–10.3)
Chloride: 113 mmol/L — ABNORMAL HIGH (ref 98–111)
Creatinine, Ser: 1.81 mg/dL — ABNORMAL HIGH (ref 0.44–1.00)
GFR calc Af Amer: 37 mL/min — ABNORMAL LOW (ref >60)
GFR calc non Af Amer: 32 mL/min — ABNORMAL LOW (ref >60)
Glucose, Bld: 78 mg/dL (ref 70–99)
Potassium: 4.2 mmol/L (ref 3.5–5.1)
Sodium: 138 mmol/L (ref 135–145)
Total Bilirubin: 0.6 mg/dL (ref 0.3–1.2)
Total Protein: 6.8 g/dL (ref 6.5–8.1)

## 2018-02-17 LAB — GLUCOSE, CAPILLARY: Glucose-Capillary: 307 mg/dL — ABNORMAL HIGH (ref 70–99)

## 2018-02-17 LAB — PROTIME-INR
INR: 1
PROTHROMBIN TIME: 13.1 s (ref 11.4–15.2)

## 2018-02-17 LAB — TROPONIN I: Troponin I: <0.03 ng/mL (ref <0.03)

## 2018-02-17 MED ORDER — HYOSCYAMINE SULFATE 0.125 MG SL SUBL: 0.1250 mg | SUBLINGUAL_TABLET | Freq: Four times a day (QID) | SUBLINGUAL | Status: DC | PRN

## 2018-02-17 MED ORDER — ISOSORBIDE MONONITRATE ER 30 MG PO TB24
120.0000 mg | ORAL_TABLET | Freq: Every day | ORAL | Status: DC
  Administered 2018-02-18 – 2018-02-19 (×2): 120 mg via ORAL
  Filled 2018-02-17 (×2): qty 4

## 2018-02-17 MED ORDER — CLOPIDOGREL BISULFATE 75 MG PO TABS
75.0000 mg | ORAL_TABLET | Freq: Every day | ORAL | Status: DC
  Administered 2018-02-18 – 2018-02-19 (×2): 75 mg via ORAL
  Filled 2018-02-17 (×2): qty 1

## 2018-02-17 MED ORDER — INSULIN ASPART 100 UNIT/ML ~~LOC~~ SOLN: 0.0000 [IU] | Freq: Three times a day (TID) | SUBCUTANEOUS | Status: DC

## 2018-02-17 MED ORDER — PANTOPRAZOLE SODIUM 40 MG PO TBEC
40.0000 mg | DELAYED_RELEASE_TABLET | Freq: Two times a day (BID) | ORAL | Status: DC
  Administered 2018-02-18 – 2018-02-19 (×3): 40 mg via ORAL
  Filled 2018-02-17 (×3): qty 1

## 2018-02-17 MED ORDER — PYRIDOSTIGMINE BROMIDE 60 MG PO TABS: 60.0000 mg | ORAL_TABLET | Freq: Three times a day (TID) | ORAL | Status: DC

## 2018-02-17 MED ORDER — SODIUM CHLORIDE 0.9% FLUSH
3.0000 mL | Freq: Once | INTRAVENOUS | Status: AC
  Administered 2018-02-17: 3 mL via INTRAVENOUS

## 2018-02-17 MED ORDER — BUPROPION HCL ER (XL) 150 MG PO TB24
150.0000 mg | ORAL_TABLET | Freq: Every day | ORAL | Status: DC
  Administered 2018-02-18 – 2018-02-19 (×2): 150 mg via ORAL
  Filled 2018-02-17 (×2): qty 1

## 2018-02-17 MED ORDER — GABAPENTIN 300 MG PO CAPS
300.0000 mg | ORAL_CAPSULE | Freq: Two times a day (BID) | ORAL | Status: DC
  Administered 2018-02-18 – 2018-02-19 (×3): 300 mg via ORAL
  Filled 2018-02-17 (×3): qty 1

## 2018-02-17 MED ORDER — SENNOSIDES-DOCUSATE SODIUM 8.6-50 MG PO TABS: 1.0000 | ORAL_TABLET | Freq: Every evening | ORAL | Status: DC | PRN

## 2018-02-17 MED ORDER — ENOXAPARIN SODIUM 30 MG/0.3ML ~~LOC~~ SOLN
30.0000 mg | SUBCUTANEOUS | Status: DC
  Administered 2018-02-18 (×2): 30 mg via SUBCUTANEOUS
  Filled 2018-02-17: qty 0.3

## 2018-02-17 MED ORDER — LEVOTHYROXINE SODIUM 25 MCG PO TABS
25.0000 ug | ORAL_TABLET | Freq: Every day | ORAL | Status: DC
  Administered 2018-02-18 – 2018-02-19 (×2): 25 ug via ORAL
  Filled 2018-02-17 (×2): qty 1

## 2018-02-17 MED ORDER — ACETAMINOPHEN 160 MG/5ML PO SOLN
650.0000 mg | ORAL | Status: DC | PRN
  Filled 2018-02-17: qty 20.3

## 2018-02-17 MED ORDER — INSULIN ASPART 100 UNIT/ML ~~LOC~~ SOLN
0.0000 [IU] | Freq: Every day | SUBCUTANEOUS | Status: DC
  Administered 2018-02-18: 4 [IU] via SUBCUTANEOUS
  Filled 2018-02-17: qty 1

## 2018-02-17 MED ORDER — ACETAMINOPHEN 650 MG RE SUPP: 650.0000 mg | RECTAL | Status: DC | PRN

## 2018-02-17 MED ORDER — ALPRAZOLAM 1 MG PO TABS
1.0000 mg | ORAL_TABLET | Freq: Every day | ORAL | Status: DC | PRN
  Administered 2018-02-18: 1 mg via ORAL
  Filled 2018-02-17 (×2): qty 1

## 2018-02-17 MED ORDER — STROKE: EARLY STAGES OF RECOVERY BOOK
Freq: Once | Status: AC
  Administered 2018-02-17

## 2018-02-17 MED ORDER — PIOGLITAZONE HCL 15 MG PO TABS
15.0000 mg | ORAL_TABLET | Freq: Every day | ORAL | Status: DC
  Filled 2018-02-17: qty 1

## 2018-02-17 MED ORDER — NITROGLYCERIN 0.4 MG SL SUBL: 0.4000 mg | SUBLINGUAL_TABLET | SUBLINGUAL | Status: DC | PRN

## 2018-02-17 MED ORDER — ROSUVASTATIN CALCIUM 10 MG PO TABS
10.0000 mg | ORAL_TABLET | Freq: Every day | ORAL | Status: DC
  Administered 2018-02-18: 08:00:00 10 mg via ORAL
  Filled 2018-02-17: qty 1

## 2018-02-17 MED ORDER — CITALOPRAM HYDROBROMIDE 20 MG PO TABS
40.0000 mg | ORAL_TABLET | Freq: Every day | ORAL | Status: DC
  Administered 2018-02-18 – 2018-02-19 (×2): 40 mg via ORAL
  Filled 2018-02-17 (×2): qty 2

## 2018-02-17 MED ORDER — SODIUM CHLORIDE 0.9 % IV SOLN
INTRAVENOUS | Status: DC
  Administered 2018-02-17: 22:00:00 via INTRAVENOUS

## 2018-02-17 MED ORDER — ASPIRIN 81 MG PO CHEW
324.0000 mg | CHEWABLE_TABLET | Freq: Every day | ORAL | Status: DC
  Administered 2018-02-18 – 2018-02-19 (×2): 324 mg via ORAL
  Filled 2018-02-17 (×2): qty 4

## 2018-02-17 MED ORDER — METOPROLOL SUCCINATE ER 50 MG PO TB24
50.0000 mg | ORAL_TABLET | Freq: Every day | ORAL | Status: DC
  Administered 2018-02-18 – 2018-02-19 (×2): 50 mg via ORAL
  Filled 2018-02-17 (×3): qty 1

## 2018-02-17 MED ORDER — ACETAMINOPHEN 325 MG PO TABS: 650.0000 mg | ORAL_TABLET | ORAL | Status: DC | PRN

## 2018-02-17 MED ORDER — PROMETHAZINE HCL 25 MG PO TABS
25.0000 mg | ORAL_TABLET | Freq: Four times a day (QID) | ORAL | Status: DC | PRN
  Filled 2018-02-17: qty 1

## 2018-02-17 MED ORDER — AMLODIPINE BESYLATE 5 MG PO TABS
5.0000 mg | ORAL_TABLET | Freq: Every day | ORAL | Status: DC
  Administered 2018-02-18 – 2018-02-19 (×2): 5 mg via ORAL
  Filled 2018-02-17 (×2): qty 1

## 2018-02-17 NOTE — ED Provider Notes (Addendum)
[facility] Emergency Department Provider Note  Time seen: 6:25 PM  I have reviewed the triage vital signs and the nursing notes.   HISTORY  Chief Complaint Fatigue and Numbness    HPI [name] is a 48 y.o. female with a past medical history of CAD, MI, CKD, CVA with left-sided weakness, diabetes, hypertension, hyperlipidemia, presents to the emergency department for increased speech difficulty and left leg weakness x1 week.  According to the patient over the past 1 week she feels like her left leg has become more weak where she is dragging it at times.  Has a history of 2 prior CVAs with left lower extremity weakness per patient.  Also states at times over the past 1 week her speech has been slurred.  Patient has no appreciable slurred speech at this time.  Patient states she only came because her son wanted her to come get checked out.  Largely negative review of systems otherwise.   Past Medical History:  Diagnosis Date  . Anemia    iron deficiency anemia  . Aortic arch aneurysm ([place])   . Bipolar disorder ([place])   . BRCA negative 2014  . CAD (coronary artery disease)    a. 08/2003 Cath: LAD 30-40-med Rx; b. 11/2014 PCI: LAD 73m(3.25x23 Xience Alpine DES); c. 06/2015 PCI: D1 (2.25x12 Resolute Integrity DES); d. 06/2017 PCI: Patent mLAD stent, D2 95 (PTCA); e. 09/2017 Cath/PCI: LM nl, LAD 386m70d, D1 40, D2 99ost (CBA), LCX 40p, RCA 40ost/p.  . Marland KitchenKD (chronic kidney disease), stage III (HCElmo  . Colon polyp   . CVA (cerebral vascular accident) (HCAberdeen   Left side weakness.   . Diabetes (HCHazelwood  . Family history of breast cancer    BRCA neg 2014  . Gastric ulcer 04/27/2011  . History of echocardiogram    a. 03/2017 Echo: EF 60-65%, no rwma.  . Marland KitchenTN (hypertension)   . Hyperlipemia   . Hypothyroid   . Malignant melanoma of skin of scalp (HCSelmer  . MI, acute, non ST segment elevation (HCHardy  . Neuromuscular disorder (HCWoodlawn Park  . S/P drug eluting coronary stent  placement 06/04/2015  . Sepsis (HCFrankford2/14/2019    Patient Active Problem List   Diagnosis Date Noted  . MI, acute, non ST segment elevation (HCTaconic Shores  . Effort angina (HCEllis05/28/2019  . Unstable angina (HCArco05/23/2019  . Syncope 04/09/2017  . Insomnia 03/18/2017  . Ischemic cardiomyopathy   . Arthritis   . Anxiety   . Acute colitis 01/27/2017  . Hx of colonic polyps   . Bariatric surgery status   . Sinus tachycardia 02/27/2016  . H/O medication noncompliance 12/14/2015  . Acute pyelonephritis   . Emesis   . CKD (chronic kidney disease), stage IV (HCMcMinn  . Atherosclerosis of native coronary artery of native heart with stable angina pectoris (HCLexington  . Hypertensive heart disease   . CKD (chronic kidney disease), stage III (HCAldine05/04/2015  . Presence of drug coated stent in LAD coronary artery & now in D1 06/04/2015  . Colitis 06/03/2015  . Carotid stenosis 04/30/2015  . Type 2 diabetes mellitus with stage 3 chronic kidney disease, without long-term current use of insulin (HCCochranville  . Atypical chest pain   . Stable angina pectoris (HCChataignier03/15/2017  . Iron deficiency anemia 03/22/2015  . Vitamin B12 deficiency 02/18/2015  . Misuse of medications for pain 02/18/2015  . Severe recurrent major  depression without psychotic features ([place]) 02/15/2015  . Helicobacter pylori infection 11/23/2014  . Hemiparesis, left ([place]) 11/23/2014  . Benign neoplasm of colon 11/20/2014  . Malignant melanoma ([place]) 08/25/2014  . Chronic systolic CHF (congestive heart failure) ([place])   . Incomplete bladder emptying 07/12/2014  . Adult hypothyroidism 12/30/2013  . Aberrant subclavian artery 11/17/2013  . Multiple sclerosis ([place]) 11/02/2013  . History of CVA (cerebrovascular accident) 06/20/2013  . Headache, migraine 05/29/2013  . Hyperlipidemia   . GERD (gastroesophageal reflux disease)   . Neuropathy ([place]) 01/02/2011  . CVA (cerebral vascular accident) ([place]) 06/21/2008  . Essential hypertension  05/01/2008    Past Surgical History:  Procedure Laterality Date  . APPENDECTOMY    . CARDIAC CATHETERIZATION N/A 11/09/2014   Procedure: Coronary Angiography;  Surgeon: Minna Merritts, MD;  Location: [place] CV LAB;  Service: Cardiovascular;  Laterality: N/A;  . CARDIAC CATHETERIZATION N/A 11/12/2014   Procedure: Coronary Stent Intervention;  Surgeon: Isaias Cowman, MD;  Location: [place] CV LAB;  Service: Cardiovascular;  Laterality: N/A;  . CARDIAC CATHETERIZATION N/A 04/18/2015   Procedure: Left Heart Cath and Coronary Angiography;  Surgeon: Minna Merritts, MD;  Location: [place] CV LAB;  Service: Cardiovascular;  Laterality: N/A;  . CARDIAC CATHETERIZATION Left 06/04/2015   Procedure: Left Heart Cath and Coronary Angiography;  Surgeon: Wellington Hampshire, MD;  Location: [place] CV LAB;  Service: Cardiovascular;  Laterality: Left;  . CARDIAC CATHETERIZATION N/A 06/04/2015   Procedure: Coronary Stent Intervention;  Surgeon: Wellington Hampshire, MD;  Location: [place] CV LAB;  Service: Cardiovascular;  Laterality: N/A;  . CESAREAN SECTION  2001  . CHOLECYSTECTOMY N/A 11/18/2016   Procedure: LAPAROSCOPIC CHOLECYSTECTOMY WITH INTRAOPERATIVE CHOLANGIOGRAM;  Surgeon: Christene Lye, MD;  Location: [facility];  Service: General;  Laterality: N/A;  . COLONOSCOPY WITH PROPOFOL N/A 04/27/2016   Procedure: COLONOSCOPY WITH PROPOFOL;  Surgeon: [name], MD;  Location: [place];  Service: Endoscopy;  Laterality: N/A;  . COLONOSCOPY WITH PROPOFOL N/A 01/12/2018   Procedure: COLONOSCOPY WITH PROPOFOL;  Surgeon: Toledo, Benay Pike, MD;  Location: [facility]OS[facility];  Service: Endoscopy;  Laterality: N/A;  . CORONARY ANGIOPLASTY    . CORONARY BALLOON ANGIOPLASTY N/A 06/29/2017   Procedure: CORONARY BALLOON ANGIOPLASTY;  Surgeon: Wellington Hampshire, MD;  Location: [place] CV LAB;  Service: Cardiovascular;  Laterality: N/A;  . CORONARY BALLOON ANGIOPLASTY N/A  09/20/2017   Procedure: CORONARY BALLOON ANGIOPLASTY;  Surgeon: Wellington Hampshire, MD;  Location: [place] CV LAB;  Service: Cardiovascular;  Laterality: N/A;  . DILATION AND CURETTAGE OF UTERUS    . ESOPHAGOGASTRODUODENOSCOPY (EGD) WITH PROPOFOL N/A 09/14/2014   Procedure: ESOPHAGOGASTRODUODENOSCOPY (EGD) WITH PROPOFOL;  Surgeon: Josefine Class, MD;  Location: [facility];  Service: Endoscopy;  Laterality: N/A;  . ESOPHAGOGASTRODUODENOSCOPY (EGD) WITH PROPOFOL N/A 04/27/2016   Procedure: ESOPHAGOGASTRODUODENOSCOPY (EGD) WITH PROPOFOL;  Surgeon: [name], MD;  Location: [place];  Service: Endoscopy;  Laterality: N/A;  Diabetic - oral meds  . ESOPHAGOGASTRODUODENOSCOPY (EGD) WITH PROPOFOL N/A 01/12/2018   Procedure: ESOPHAGOGASTRODUODENOSCOPY (EGD) WITH PROPOFOL;  Surgeon: Toledo, Benay Pike, MD;  Location: [facility] ENDOSCOPY;  Service: Endoscopy;  Laterality: N/A;  . GASTRIC BYPASS  09/2009   [facility]   . Left Carotid to sublcavian artery bypass w/ subclavian artery ligation     a. Performed @ [place].  . LEFT HEART CATH AND CORONARY ANGIOGRAPHY Left 06/29/2017   Procedure: LEFT HEART CATH AND CORONARY ANGIOGRAPHY;  Surgeon: Fletcher Anon,  Mertie Clause, MD;  Location: [place] CV LAB;  Service: Cardiovascular;  Laterality: Left;  . LEFT HEART CATH AND CORONARY ANGIOGRAPHY N/A 09/20/2017   Procedure: LEFT HEART CATH AND CORONARY ANGIOGRAPHY;  Surgeon: Wellington Hampshire, MD;  Location: [place] CV LAB;  Service: Cardiovascular;  Laterality: N/A;  . LEFT HEART CATH AND CORONARY ANGIOGRAPHY N/A 12/20/2017   Procedure: LEFT HEART CATH AND CORONARY ANGIOGRAPHY;  Surgeon: Wellington Hampshire, MD;  Location: [place] CV LAB;  Service: Cardiovascular;  Laterality: N/A;  . MELANOMA EXCISION  2016   Dr. Evorn Gong  . [place]  2002  . RIGHT OOPHORECTOMY    . SHOULDER ARTHROSCOPY WITH OPEN ROTATOR CUFF REPAIR Right 01/07/2016   Procedure: SHOULDER ARTHROSCOPY  WITH DEBRIDMENT, SUBACHROMIAL DECOMPRESSION;  Surgeon: Corky Mull, MD;  Location: [facility];  Service: Orthopedics;  Laterality: Right;  . SHOULDER ARTHROSCOPY WITH OPEN ROTATOR CUFF REPAIR Right 03/16/2017   Procedure: SHOULDER ARTHROSCOPY WITH OPEN ROTATOR CUFF REPAIR POSSIBLE BICEPS TENODESIS;  Surgeon: Corky Mull, MD;  Location: [facility];  Service: Orthopedics;  Laterality: Right;  . TRIGGER FINGER RELEASE Right     [place]    Prior to Admission medications   Medication Sig Start Date End Date Taking? Authorizing Provider  ALPRAZolam (XANAX) 1 MG tablet TAKE 1 TABLET (1 MG TOTAL) BY MOUTH DAILY AS NEEDED FOR ANXIETY. 12/14/17   [name], MD  amLODipine (NORVASC) 5 MG tablet Take 1 tablet (5 mg total) by mouth daily. 11/29/17   Minna Merritts, MD  aspirin 81 MG chewable tablet Chew 1 tablet (81 mg total) by mouth daily. Patient taking differently: Chew 324 mg by mouth daily.  07/01/17   Dunn, Areta Haber, PA-C  Blood Glucose Monitoring Suppl (ONE TOUCH ULTRA 2) w/Device KIT Use to check blood sugar once a day. Dx. E11.9 03/18/17   [name], MD  buPROPion (WELLBUTRIN XL) 150 MG 24 hr tablet TAKE 1 TABLET BY MOUTH DAILY 08/10/17   [name], MD  citalopram (CELEXA) 40 MG tablet Take 1 tablet (40 mg total) by mouth daily. 06/08/17   [name], MD  clopidogrel (PLAVIX) 75 MG tablet Take 1 tablet (75 mg total) by mouth daily with breakfast. Patient not taking: Reported on 01/05/2018 07/01/17   Rise Mu, PA-C  Cyanocobalamin (VITAMIN B 12 PO) Take 1,000 mcg by mouth daily.     [provider]  gabapentin (NEURONTIN) 300 MG capsule TAKE 1 CAPSULE BY MOUTH TWICE A DAY Patient taking differently: Take 300 mg by mouth 2 (two) times daily.  10/30/17   [name], MD  hyoscyamine (LEVSIN SL) 0.125 MG SL tablet Place 1 tablet (0.125 mg total) under the tongue every 6 (six) hours as needed. Patient taking differently: Place 0.125 mg under the tongue every 6  (six) hours as needed for cramping.  02/01/17   [name], MD  isosorbide mononitrate (IMDUR) 120 MG 24 hr tablet Take 1 tablet (120 mg total) by mouth daily. 10/13/17   Dunn, Areta Haber, PA-C  levothyroxine (SYNTHROID, LEVOTHROID) 25 MCG tablet Take 1 tablet (25 mcg total) daily before breakfast by mouth. 12/18/16   [name], MD  losartan (COZAAR) 25 MG tablet Take 1/2 tablet (12.5 mg) by mouth once daily Patient taking differently: Take 25 mg by mouth daily. Take 1/2 tablet (12.5 mg) by mouth once daily 10/13/17   Rise Mu, PA-C  metoprolol succinate (TOPROL-XL) 50 MG 24 hr  tablet Take 1 tablet (50 mg total) by mouth daily. Take with or immediately following a meal. 07/01/17   Dunn, Areta Haber, PA-C  nitroGLYCERIN (NITROSTAT) 0.4 MG SL tablet Place 1 tablet (0.4 mg total) under the tongue every 5 (five) minutes as needed for chest pain. 06/02/17   Minna Merritts, MD  pantoprazole (PROTONIX) 40 MG tablet Take 1 tablet (40 mg total) by mouth 2 (two) times daily. 05/11/16   [name], MD  pioglitazone (ACTOS) 15 MG tablet Take 15 mg by mouth daily.    [provider]  promethazine (PHENERGAN) 25 MG tablet Take 1 tablet (25 mg total) by mouth every 6 (six) hours as needed for nausea or vomiting. 10/20/17   [name], MD  pyridostigmine (MESTINON) 60 MG tablet Take 60 mg by mouth 3 (three) times daily.  07/26/17   [provider]  rosuvastatin (CRESTOR) 10 MG tablet Take 1 tablet (10 mg total) by mouth daily. 08/10/17   Minna Merritts, MD    Allergies  Allergen Reactions  . Lipitor [Atorvastatin] Other (See Comments)    Leg pains  . Tramadol Other (See Comments)    Mouth feels like it's on fire    Family History  Problem Relation Age of Onset  . Hypertension Mother   . Anxiety disorder Mother   . Depression Mother   . Bipolar disorder Mother   . Heart disease Mother        No details  . Hyperlipidemia Mother   . Kidney disease Father   . Heart  disease Father 95  . Hypertension Father   . Diabetes Father   . Stroke Father   . Colon cancer Father        dx in his 45's  . Anxiety disorder Father   . Depression Father   . Skin cancer Father   . Kidney disease Sister   . Thyroid nodules Sister   . Hypertension Sister   . Hypertension Sister   . Diabetes Sister   . Hyperlipidemia Sister   . Depression Sister   . Breast cancer Maternal Aunt 20  . Breast cancer Maternal Aunt 11  . Ovarian cancer Cousin   . Colon cancer Cousin   . Breast cancer Other   . Kidney cancer Neg Hx   . Bladder Cancer Neg Hx     Social History Social History   Tobacco Use  . Smoking status: Former Smoker    Types: Cigarettes    Last attempt to quit: 08/31/1994    Years since quitting: 23.4  . Smokeless tobacco: Never Used  . Tobacco comment: quit 28 years ago  Substance Use Topics  . Alcohol use: No    Alcohol/week: 0.0 standard drinks  . Drug use: No    Review of Systems Constitutional: Negative for fever. Cardiovascular: Negative for chest pain. Respiratory: Negative for shortness of breath. Gastrointestinal: Negative for abdominal pain, vomiting  Genitourinary: Negative for urinary compaints Musculoskeletal: Negative for musculoskeletal complaints Skin: Negative for skin complaints  Neurological: Negative for headache.  Increase left-sided weakness. All other ROS negative  ____________________________________________   PHYSICAL EXAM:  VITAL SIGNS: ED Triage Vitals  Enc Vitals Group     BP 02/17/18 1446 122/75     Pulse Rate 02/17/18 1446 77     Resp 02/17/18 1446 16     Temp 02/17/18 1446 98.3 F (36.8 C)     Temp Source 02/17/18 1446 Oral     SpO2 02/17/18 1446  97 %     Weight 02/17/18 1444 170 lb (77.1 kg)     Height 02/17/18 1444 5' 2" (1.575 m)     Head Circumference --      Peak Flow --      Pain Score 02/17/18 1444 0     Pain Loc --      Pain Edu? --      Excl. in [place]? --    Constitutional: Alert and  oriented. Well appearing and in no distress. Eyes: Normal exam ENT   Head: Normocephalic and atraumatic.   Mouth/Throat: Mucous membranes are moist. Cardiovascular: Normal rate, regular rhythm. No murmur Respiratory: Normal respiratory effort without tachypnea nor retractions. Breath sounds are clear Gastrointestinal: Soft and nontender. No distention. Musculoskeletal: Nontender with normal range of motion in all extremities.  Neurologic:  Normal speech and language. No gross focal neurologic deficits Skin:  Skin is warm, dry and intact.  Psychiatric: Mood and affect are normal.   ____________________________________________  EKG EKG viewed and interpreted by myself shows a normal sinus rhythm at 57 bpm with a narrow QRS, normal axis, normal intervals, nonspecific ST changes but no ST elevation.    RADIOLOGY  IMPRESSION: Atrophy with small vessel chronic ischemic changes of deep cerebral white matter.  Old lacunar infarcts LEFT thalamus and genu of RIGHT internal capsule.  No acute intracranial abnormalities.  ____________________________________________   INITIAL IMPRESSION / ASSESSMENT AND PLAN / ED COURSE  Pertinent labs & imaging results that were available during my care of the patient were reviewed by me and considered in my medical decision making (see chart for details).  Patient presents to the emergency department with increased left lower extremity weakness over the past 1 week.  History of 2 strokes previously with left leg weakness at baseline.  Feels like it is worse than normal, also states she has intermittently had slurred speech over the past 1 week, none currently.  Overall the patient appears very well, work-up is largely within normal limits/baseline for the patient including blood work and CT scan of the head.  We will obtain an MRI of the brain as a precaution.  We will obtain a urinalysis as well.  Overall the patient appears well, states she did  not want to come but her family made her.  Patient's MRI is positive for acute CVA.  This would explain the patient's symptoms over the past 1 week.  Will admit to the hospital service for further work-up.  Patient agreeable to plan of care.  Has taken aspirin and Plavix this morning already.  NIH Stroke Scale   Interval: Baseline Time: 9:09 PM Person Administering Scale: Harvest Dark  Administer stroke scale items in the order listed. Record performance in each category after each subscale exam. Do not go back and change scores. Follow directions provided for each exam technique. Scores should reflect what the patient does, not what the clinician thinks the patient can do. The clinician should record answers while administering the exam and work quickly. Except where indicated, the patient should not be coached (i.e., repeated requests to patient to make a special effort).   1a  Level of consciousness: 0=alert; keenly responsive  1b. LOC questions:  0=Performs both tasks correctly  1c. LOC commands: 0=Performs both tasks correctly  2.  Best Gaze: 0=normal  3.  Visual: 0=No visual loss  4. Facial Palsy: 0=Normal symmetric movement  5a.  Motor left arm: 0=No drift, limb holds 90 (or 45) degrees for full 10  seconds  5b.  Motor right arm: 0=No drift, limb holds 90 (or 45) degrees for full 10 seconds  6a. motor left leg: 1=Drift, limb holds 90 (or 45) degrees but drifts down before full 10 seconds: does not hit bed  6b  Motor right leg:  0=No drift, limb holds 90 (or 45) degrees for full 10 seconds  7. Limb Ataxia: 0=Absent  8.  Sensory: 1=Mild to moderate sensory loss; patient feels pinprick is less sharp or is dull on the affected side; there is a loss of superficial pain with pinprick but patient is aware She is being touched  9. Best Language:  0=No aphasia, normal  10. Dysarthria: 0=Normal  11. Extinction and Inattention: 0=No abnormality  12. Distal motor function: 0=Normal    Total:   2    ____________________________________________   FINAL CLINICAL IMPRESSION(S) / ED DIAGNOSES  Weakness Acute CVA   Harvest Dark, MD 02/17/18 2108    Harvest Dark, MD 02/17/18 2109    Harvest Dark, MD 02/17/18 2240

## 2018-02-17 NOTE — ED Notes (Signed)
Patient transported to MRI 

## 2018-02-17 NOTE — ED Triage Notes (Addendum)
Arrives with c/o "dragging to left leg" for a couple of days., feeling fatigued x 1 week  Family states speech changed Sunday morning intermittently, but Monday morning speech change became constant.

## 2018-02-17 NOTE — H&P (Signed)
[facility] [place] NAME: [name]    MR#:  [id number]  DATE OF BIRTH:  1971/01/12  DATE OF ADMISSION:  02/17/2018  PRIMARY CARE PHYSICIAN: [name], MD   REQUESTING/REFERRING PHYSICIAN: [email or web address]  CHIEF COMPLAINT:   Chief Complaint  Patient presents with  . Fatigue  . Numbness    HISTORY OF PRESENT ILLNESS: [name]  is a 48 y.o. female with a known history of anemia, aortic arch aneurysm, bipolar disorder, coronary artery disease and multiple catheterizations and stent placement and suggested to her medical management a few months ago, chronic kidney disease stage III, colon polyp, cerebrovascular accident with left-sided weakness, diabetes, familial breast cancer, hypertension, hyperlipidemia, hypothyroidism-was taking all her medications as prescribed. For last 1 week she is more weak on her left leg and almost dragging her feet while walking which is new for her and also have new slurred speech for 1 week. As per her she has multiple medical issues and she does not come to the hospital for minor things so she decided to avoid coming to emergency room for few days but finally her son made her to come to get checked out. She was noted to have acute to subacute stroke on MRI and so advised to admit to hospitalist service for further management.  PAST MEDICAL HISTORY:   Past Medical History:  Diagnosis Date  . Anemia    iron deficiency anemia  . Aortic arch aneurysm ([place])   . Bipolar disorder ([place])   . BRCA negative 2014  . CAD (coronary artery disease)    a. 08/2003 Cath: LAD 30-40-med Rx; b. 11/2014 PCI: LAD 68m(3.25x23 Xience Alpine DES); c. 06/2015 PCI: D1 (2.25x12 Resolute Integrity DES); d. 06/2017 PCI: Patent mLAD stent, D2 95 (PTCA); e. 09/2017 Cath/PCI: LM nl, LAD 375m70d, D1 40, D2 99ost (CBA), LCX 40p, RCA 40ost/p.  . Marland KitchenKD (chronic kidney disease), stage III ([facility]  . Colon polyp   . CVA (cerebral vascular  accident) ([facility]   Left side weakness.   . Diabetes ([facility]  . Family history of breast cancer    BRCA neg 2014  . Gastric ulcer 04/27/2011  . History of echocardiogram    a. 03/2017 Echo: EF 60-65%, no rwma.  . Marland KitchenTN (hypertension)   . Hyperlipemia   . Hypothyroid   . Malignant melanoma of skin of scalp ([facility]  . MI, acute, non ST segment elevation ([facility] [place]  . Neuromuscular disorder ([facility]  . S/P drug eluting coronary stent placement 06/04/2015  . Sepsis (HCBadger2/14/2019    PAST SURGICAL HISTORY:  Past Surgical History:  Procedure Laterality Date  . APPENDECTOMY    . CARDIAC CATHETERIZATION N/A 11/09/2014   Procedure: Coronary Angiography;  Surgeon: TiMinna MerrittsMD;  [facility];  Service: Cardiovascular;  Laterality: N/A;  . CARDIAC CATHETERIZATION N/A 11/12/2014   Procedure: Coronary Stent Intervention;  Surgeon: AlIsaias CowmanMD;  [facility];  Service: Cardiovascular;  Laterality: N/A;  . CARDIAC CATHETERIZATION N/A 04/18/2015   Procedure: Left Heart Cath and Coronary Angiography;  Surgeon: TiMinna MerrittsMD;  [facility];  Service: Cardiovascular;  Laterality: N/A;  . CARDIAC CATHETERIZATION Left 06/04/2015   Procedure: Left Heart Cath and Coronary Angiography;  Surgeon: MuWellington HampshireMD;  [facility];  Service: Cardiovascular;  Laterality: Left;  . CARDIAC CATHETERIZATION N/A 06/04/2015  Procedure: Coronary Stent Intervention;  Surgeon: Wellington Hampshire, MD;  Location: [place] [facility];  Service: Cardiovascular;  Laterality: N/A;  . CESAREAN SECTION  2001  . CHOLECYSTECTOMY N/A 11/18/2016   Procedure: LAPAROSCOPIC CHOLECYSTECTOMY WITH INTRAOPERATIVE CHOLANGIOGRAM;  Surgeon: Christene Lye, MD;  [facility]: [facility];  Service: General;  Laterality: N/A;  . COLONOSCOPY WITH PROPOFOL N/A 04/27/2016   Procedure: COLONOSCOPY WITH PROPOFOL;  Surgeon: [name], MD;  Location: [place] [facility];  Service: Endoscopy;  Laterality: N/A;  . COLONOSCOPY WITH PROPOFOL N/A 01/12/2018   Procedure: COLONOSCOPY WITH PROPOFOL;  Surgeon: Toledo, Benay Pike, MD;  Location: [facility];  Service: Endoscopy;  Laterality: N/A;  . CORONARY ANGIOPLASTY    . CORONARY BALLOON ANGIOPLASTY N/A 06/29/2017   Procedure: CORONARY BALLOON ANGIOPLASTY;  Surgeon: Wellington Hampshire, MD;  Location: [place] [facility];  Service: Cardiovascular;  Laterality: N/A;  . CORONARY BALLOON ANGIOPLASTY N/A 09/20/2017   Procedure: CORONARY BALLOON ANGIOPLASTY;  Surgeon: Wellington Hampshire, MD;  Location: [place] [facility];  Service: Cardiovascular;  Laterality: N/A;  . DILATION AND CURETTAGE OF UTERUS    . ESOPHAGOGASTRODUODENOSCOPY (EGD) WITH PROPOFOL N/A 09/14/2014   Procedure: ESOPHAGOGASTRODUODENOSCOPY (EGD) WITH PROPOFOL;  Surgeon: Josefine Class, MD;  Location: [facility];  Service: Endoscopy;  Laterality: N/A;  . ESOPHAGOGASTRODUODENOSCOPY (EGD) WITH PROPOFOL N/A 04/27/2016   Procedure: ESOPHAGOGASTRODUODENOSCOPY (EGD) WITH PROPOFOL;  Surgeon: [name], MD;  Location: [place];  Service: Endoscopy;  Laterality: N/A;  Diabetic - oral meds  . ESOPHAGOGASTRODUODENOSCOPY (EGD) WITH PROPOFOL N/A 01/12/2018   Procedure: ESOPHAGOGASTRODUODENOSCOPY (EGD) WITH PROPOFOL;  Surgeon: Toledo, Benay Pike, MD;  Location: [facility];  Service: Endoscopy;  Laterality: N/A;  . GASTRIC BYPASS  09/2009   [name]   . Left Carotid to sublcavian artery bypass w/ subclavian artery ligation     a. Performed @ [facility].  . LEFT HEART CATH AND CORONARY ANGIOGRAPHY Left 06/29/2017   Procedure: LEFT HEART CATH AND CORONARY ANGIOGRAPHY;  Surgeon: Wellington Hampshire, MD;  Location: [place] [facility] LAB;  Service: Cardiovascular;  Laterality: Left;  . LEFT HEART CATH AND CORONARY ANGIOGRAPHY N/A 09/20/2017   Procedure: LEFT HEART CATH AND CORONARY ANGIOGRAPHY;  Surgeon: Wellington Hampshire, MD;   Location: [place] [facility] LAB;  Service: Cardiovascular;  Laterality: N/A;  . LEFT HEART CATH AND CORONARY ANGIOGRAPHY N/A 12/20/2017   Procedure: LEFT HEART CATH AND CORONARY ANGIOGRAPHY;  Surgeon: Wellington Hampshire, MD;  Location: [place] [facility];  Service: Cardiovascular;  Laterality: N/A;  . MELANOMA EXCISION  2016   Dr. Evorn Gong  . [place]  2002  . RIGHT OOPHORECTOMY    . SHOULDER ARTHROSCOPY WITH OPEN ROTATOR CUFF REPAIR Right 01/07/2016   Procedure: SHOULDER ARTHROSCOPY WITH DEBRIDMENT, SUBACHROMIAL DECOMPRESSION;  Surgeon: Corky Mull, MD;  Location: [facility];  Service: Orthopedics;  Laterality: Right;  . SHOULDER ARTHROSCOPY WITH OPEN ROTATOR CUFF REPAIR Right 03/16/2017   Procedure: SHOULDER ARTHROSCOPY WITH OPEN ROTATOR CUFF REPAIR POSSIBLE BICEPS TENODESIS;  Surgeon: Corky Mull, MD;  Location: [facility];  Service: Orthopedics;  Laterality: Right;  . TRIGGER FINGER RELEASE Right     [place]    SOCIAL HISTORY:  Social History   Tobacco Use  . Smoking status: Former Smoker    Types: Cigarettes    Last attempt to quit: 08/31/1994    Years since quitting: 23.4  . Smokeless tobacco: Never Used  . Tobacco comment: quit 28 years ago  Substance  Use Topics  . Alcohol use: No    Alcohol/week: 0.0 standard drinks    FAMILY HISTORY:  Family History  Problem Relation Age of Onset  . Hypertension Mother   . Anxiety disorder Mother   . Depression Mother   . Bipolar disorder Mother   . Heart disease Mother        No details  . Hyperlipidemia Mother   . Kidney disease Father   . Heart disease Father 7  . Hypertension Father   . Diabetes Father   . Stroke Father   . Colon cancer Father        dx in his 72's  . Anxiety disorder Father   . Depression Father   . Skin cancer Father   . Kidney disease Sister   . Thyroid nodules Sister   . Hypertension Sister   . Hypertension Sister   . Diabetes Sister   . Hyperlipidemia Sister   . Depression Sister    . Breast cancer Maternal Aunt 73  . Breast cancer Maternal Aunt 31  . Ovarian cancer Cousin   . Colon cancer Cousin   . Breast cancer Other   . Kidney cancer Neg Hx   . Bladder Cancer Neg Hx     DRUG ALLERGIES:  Allergies  Allergen Reactions  . Lipitor [Atorvastatin] Other (See Comments)    Leg pains  . Tramadol Other (See Comments)    Mouth feels like it's on fire    REVIEW OF SYSTEMS:   CONSTITUTIONAL: No fever, fatigue or weakness.  EYES: No blurred or double vision.  EARS, NOSE, AND THROAT: No tinnitus or ear pain.  RESPIRATORY: No cough, shortness of breath, wheezing or hemoptysis.  CARDIOVASCULAR: No chest pain, orthopnea, edema.  GASTROINTESTINAL: No nausea, vomiting, diarrhea or abdominal pain.  GENITOURINARY: No dysuria, hematuria.  ENDOCRINE: No polyuria, nocturia,  HEMATOLOGY: No anemia, easy bruising or bleeding SKIN: No rash or lesion. MUSCULOSKELETAL: No joint pain or arthritis.   NEUROLOGIC: No tingling, numbness, left lower limb weakness.  PSYCHIATRY: No anxiety or depression.   MEDICATIONS AT HOME:  Prior to Admission medications   Medication Sig Start Date End Date Taking? Authorizing Provider  ALPRAZolam (XANAX) 1 MG tablet TAKE 1 TABLET (1 MG TOTAL) BY MOUTH DAILY AS NEEDED FOR ANXIETY. 12/14/17  Yes [name], MD  amLODipine (NORVASC) 5 MG tablet Take 1 tablet (5 mg total) by mouth daily. 11/29/17  Yes Minna Merritts, MD  aspirin 81 MG chewable tablet Chew 1 tablet (81 mg total) by mouth daily. 07/01/17  Yes Dunn, Areta Haber, PA-C  Blood Glucose Monitoring Suppl (ONE TOUCH ULTRA 2) w/Device KIT Use to check blood sugar once a day. Dx. E11.9 03/18/17  Yes [name], MD  buPROPion (WELLBUTRIN XL) 150 MG 24 hr tablet TAKE 1 TABLET BY MOUTH DAILY 08/10/17  Yes [name], MD  citalopram (CELEXA) 40 MG tablet Take 1 tablet (40 mg total) by mouth daily. 06/08/17  Yes [name], MD  clopidogrel (PLAVIX) 75 MG tablet Take 1 tablet (75 mg  total) by mouth daily with breakfast. 07/01/17  Yes Dunn, Areta Haber, PA-C  Cyanocobalamin (VITAMIN B 12 PO) Take 1,000 mcg by mouth daily.    Yes [provider]  gabapentin (NEURONTIN) 300 MG capsule TAKE 1 CAPSULE BY MOUTH TWICE A DAY Patient taking differently: Take 300 mg by mouth 2 (two) times daily.  10/30/17  Yes [name], MD  isosorbide mononitrate (IMDUR) 120 MG 24  hr tablet Take 1 tablet (120 mg total) by mouth daily. 10/13/17  Yes Dunn, Areta Haber, PA-C  levothyroxine (SYNTHROID, LEVOTHROID) 25 MCG tablet Take 1 tablet (25 mcg total) daily before breakfast by mouth. 12/18/16  Yes [name], MD  losartan (COZAAR) 25 MG tablet Take 1/2 tablet (12.5 mg) by mouth once daily Patient taking differently: Take 25 mg by mouth daily.  10/13/17  Yes Dunn, Areta Haber, PA-C  metoprolol succinate (TOPROL-XL) 50 MG 24 hr tablet Take 1 tablet (50 mg total) by mouth daily. Take with or immediately following a meal. 07/01/17  Yes Dunn, Ryan M, PA-C  pantoprazole (PROTONIX) 40 MG tablet Take 1 tablet (40 mg total) by mouth 2 (two) times daily. 05/11/16  Yes [name], MD  promethazine (PHENERGAN) 25 MG tablet Take 1 tablet (25 mg total) by mouth every 6 (six) hours as needed for nausea or vomiting. 10/20/17  Yes [name], MD  rosuvastatin (CRESTOR) 10 MG tablet Take 1 tablet (10 mg total) by mouth daily. 08/10/17  Yes Gollan, Kathlene November, MD  hyoscyamine (LEVSIN SL) 0.125 MG SL tablet Place 1 tablet (0.125 mg total) under the tongue every 6 (six) hours as needed. Patient not taking: Reported on 02/17/2018 02/01/17   [name], MD  nitroGLYCERIN (NITROSTAT) 0.4 MG SL tablet Place 1 tablet (0.4 mg total) under the tongue every 5 (five) minutes as needed for chest pain. 06/02/17   Minna Merritts, MD  pioglitazone (ACTOS) 15 MG tablet Take 15 mg by mouth daily.    [provider]  pyridostigmine (MESTINON) 60 MG tablet Take 60 mg by mouth 3 (three) times daily.  07/26/17    [provider]      PHYSICAL EXAMINATION:   VITAL SIGNS: Blood pressure 130/74, pulse (!) 56, temperature 98.3 F (36.8 C), temperature source Oral, resp. rate 18, height 5' 2" (1.575 m), weight 77.1 kg, SpO2 96 %.  GENERAL:  48 y.o.-year-old patient lying in the bed with no acute distress.  EYES: Pupils equal, round, reactive to light and accommodation. No scleral icterus. Extraocular muscles intact.  HEENT: Head atraumatic, normocephalic. Oropharynx and nasopharynx clear.  NECK:  Supple, no jugular venous distention. No thyroid enlargement, no tenderness.  LUNGS: Normal breath sounds bilaterally, no wheezing, rales,rhonchi or crepitation. No use of accessory muscles of respiration.  CARDIOVASCULAR: S1, S2 normal. No murmurs, rubs, or gallops.  ABDOMEN: Soft, nontender, nondistended. Bowel sounds present. No organomegaly or mass.  EXTREMITIES: No pedal edema, cyanosis, or clubbing.  NEUROLOGIC: Cranial nerves II through XII are intact. Muscle strength 5/5 in all extremities except 3 out of 5 in left lower leg. Sensation intact. Gait not checked.  Slurred speech but still able to speak and easy to understand. PSYCHIATRIC: The patient is alert and oriented x 3.  SKIN: No obvious rash, lesion, or ulcer.   LABORATORY PANEL:   CBC Recent Labs  Lab 02/17/18 1451  WBC 8.6  HGB 11.1*  HCT 34.7*  PLT 196  MCV 81.6  MCH 26.1  MCHC 32.0  RDW 13.9  LYMPHSABS 1.5  MONOABS 0.5  EOSABS 0.3  BASOSABS 0.1   ------------------------------------------------------------------------------------------------------------------  Chemistries  Recent Labs  Lab 02/17/18 1451  NA 138  K 4.2  CL 113*  CO2 18*  GLUCOSE 78  BUN 22*  CREATININE 1.81*  CALCIUM 8.5*  AST 19  ALT 17  ALKPHOS 128*  BILITOT 0.6   ------------------------------------------------------------------------------------------------------------------ estimated creatinine clearance is 36.1 mL/min (A) (by C-G  formula based on SCr of 1.81 mg/dL (H)). ------------------------------------------------------------------------------------------------------------------ No results for input(s): TSH, T4TOTAL, T3FREE, THYROIDAB in the last 72 hours.  Invalid input(s): FREET3   Coagulation profile Recent Labs  Lab 02/17/18 1451  INR 1.00   ------------------------------------------------------------------------------------------------------------------- No results for input(s): DDIMER in the last 72 hours. -------------------------------------------------------------------------------------------------------------------  Cardiac Enzymes Recent Labs  Lab 02/17/18 1451  TROPONINI <0.03   ------------------------------------------------------------------------------------------------------------------ Invalid input(s): POCBNP  ---------------------------------------------------------------------------------------------------------------  Urinalysis    Component Value Date/Time   COLORURINE YELLOW (A) 11/04/2017 1621   APPEARANCEUR HAZY (A) 11/04/2017 1621   APPEARANCEUR Cloudy (A) 07/17/2014 1455   LABSPEC 1.008 11/04/2017 1621   LABSPEC 1.012 12/29/2013 1210   PHURINE 5.0 11/04/2017 1621   GLUCOSEU NEGATIVE 11/04/2017 1621   GLUCOSEU >=500 12/29/2013 1210   HGBUR NEGATIVE 11/04/2017 1621   HGBUR negative 04/14/2010 1435   BILIRUBINUR NEGATIVE 11/04/2017 1621   BILIRUBINUR negative 04/15/2017 0841   BILIRUBINUR Negative 07/17/2014 1455   BILIRUBINUR Negative 12/29/2013 1210   KETONESUR NEGATIVE 11/04/2017 1621   PROTEINUR NEGATIVE 11/04/2017 1621   UROBILINOGEN 0.2 04/15/2017 0841   UROBILINOGEN negative 04/14/2010 1435   NITRITE NEGATIVE 11/04/2017 1621   LEUKOCYTESUR NEGATIVE 11/04/2017 1621   LEUKOCYTESUR Trace (A) 07/17/2014 1455   LEUKOCYTESUR Negative 12/29/2013 1210     RADIOLOGY: Ct Head Wo Contrast  Result Date: 02/17/2018 CLINICAL DATA:  Dragging LEFT leg for a  couple days, fatigue for 1 week, change in speech habits. History of coronary artery disease post MI, diabetes, hypertension. EXAM: CT HEAD WITHOUT CONTRAST TECHNIQUE: Contiguous axial images were obtained from the base of the skull through the vertex without intravenous contrast. Sagittal and coronal MPR images reconstructed from axial data set. COMPARISON:  12/23/2017 FINDINGS: Brain: Generalized atrophy. Normal ventricular morphology. No midline shift or mass effect. Small vessel chronic ischemic changes of deep cerebral white matter. Old lacunar infarcts at genu of RIGHT internal capsule and LEFT thalamus. No intracranial hemorrhage, mass lesion, evidence of acute infarction, or extra-axial fluid collection. Vascular: Atherosclerotic calcifications of internal carotid and vertebral arteries at skull base. No definite hyperdense vessels. Skull: Intact Sinuses/Orbits: Clear Other: N/A IMPRESSION: Atrophy with small vessel chronic ischemic changes of deep cerebral white matter. Old lacunar infarcts LEFT thalamus and genu of RIGHT internal capsule. No acute intracranial abnormalities. Electronically Signed   By: Lavonia Dana M.D.   On: 02/17/2018 15:24   Mr Brain Wo Contrast  Result Date: 02/17/2018 CLINICAL DATA:  48 y/o F; slurred speech intermittently and confusion. Dragging of the left leg. EXAM: MRI HEAD WITHOUT CONTRAST TECHNIQUE: Multiplanar, multiecho pulse sequences of the brain and surrounding structures were obtained without intravenous contrast. COMPARISON:  02/17/2018 CT head.  04/10/2017 MRI head. FINDINGS: Brain: 7 mm focus of mildly reduced diffusion within the left medial frontal white matter with T2 FLAIR hyperintense signal abnormality (series 2, image 38) compatible with late acute to early subacute infarction. No hemorrhage or mass effect. Small chronic infarcts are present in the bilateral thalami, right genu of corpus callosum, right hemi pons, right anterior and posterior corona radiata  and the left parietal cortex. Stable confluent white matter hyperintensities in subcortical and periventricular white matter, throughout basal ganglia, and pons compatible with advanced chronic microvascular ischemic changes. Punctate focus of susceptibility hypointensity within the right parietal lobe compatible hemosiderin deposition of chronic microhemorrhage. No extra-axial collection, hydrocephalus, mass effect, or herniation. Vascular: Normal flow voids. Skull and upper cervical spine: Normal marrow signal. Sinuses/Orbits: Negative. Other: None. IMPRESSION: 1. 7 mm late acute/early subacute  infarction of left medial frontal lobe. No associated hemorrhage or mass effect. 2. Stable advanced chronic microvascular ischemic changes and multiple small chronic infarcts of the brain. These results were called by telephone at the time of interpretation on 02/17/2018 at 8:42 pm to Dr. Harvest Dark , who verbally acknowledged these results. Electronically Signed   By: Kristine Garbe M.D.   On: 02/17/2018 20:44    EKG: Orders placed or performed during the hospital encounter of 02/17/18  . ED EKG  . ED EKG    IMPRESSION AND PLAN:  *Acute to subacute stroke Admit per stroke protocol. MRI brain is already done.  Will get carotid Doppler study and echocardiogram as it is not done in last 6 months. Check lipid panel and hemoglobin A1c.  Neurology consult, physical therapy evaluation. Patient was already taking aspirin and Plavix I would continue that for now and let neurologist decide.  *Coronary artery disease Patient has recent stent placement and still have persistent atherosclerosis and was advised to continue medical management. Continue cardiac meds as per the home medication.  *Hypertension As the stroke happened most likely 1 week ago, I would continue her antihypertensive medications for now.  *Diabetes Continue home medication and keep on sliding scale  coverage.  *Hyperlipidemia Continue rosuvastatin.  All the records are reviewed and case discussed with ED provider. Management plans discussed with the patient, family and they are in agreement.  CODE STATUS: Full. Code Status History    Date Active Date Inactive Code Status Order ID Comments User Context   12/19/2017 1624 12/21/2017 1544 Full Code [id number]  Gladstone Lighter, MD Inpatient   09/19/2017 1657 09/21/2017 1519 Full Code [id number]  Loletha Grayer, MD ED   06/29/2017 1241 06/30/2017 1523 Full Code [id number]  Wellington Hampshire, MD Inpatient   04/09/2017 1448 04/13/2017 1738 DNR [id number]  Nicholes Mango, MD Inpatient   03/18/2017 2348 03/22/2017 1844 Full Code [id number]  Henreitta Leber, MD ED   03/16/2017 1539 03/16/2017 1937 Full Code [id number]  Poggi, Marshall Cork, MD Inpatient   01/27/2017 1934 01/29/2017 1839 Full Code [id number]  Salary, Avel Peace, MD Inpatient   01/07/2016 1630 01/07/2016 2016 Full Code [id number]  Corky Mull, MD Inpatient   12/14/2015 0239 12/14/2015 1733 Full Code [id number]  Harrie Foreman, MD ED   11/07/2015 1521 11/08/2015 1920 Full Code [id number]  Baxter Hire, MD Inpatient   10/28/2015 1604 11/02/2015 1504 Full Code [id number]  Henreitta Leber, MD Inpatient   06/03/2015 1510 06/05/2015 1501 Full Code [id number]  Fritzi Mandes, MD Inpatient   04/17/2015 2159 04/18/2015 2045 Full Code [id number]  Vaughan Basta, MD ED   02/15/2015 2004 02/20/2015 1903 Full Code [id number]  Gonzella Lex, MD Inpatient   11/12/2014 0833 11/13/2014 1449 Full Code [id number]  Isaias Cowman, MD Inpatient   11/07/2014 2153 11/12/2014 0833 Full Code [id number]  Henreitta Leber, MD Inpatient       TOTAL TIME TAKING CARE OF THIS PATIENT: 45 minutes.    Vaughan Basta M.D on 02/17/2018   Between 7am to 6pm - Pager - [phone]  After 6pm go to [email or web address] - password [email or web address] [facility]  Office  [phone]  CC: Primary care physician;  [name], MD   Note: This dictation was prepared with Dragon dictation along with smaller phrase technology. Any transcriptional errors that result from this process are unintentional.

## 2018-02-17 NOTE — ED Notes (Signed)
Patient in phone with MRI to answer questions.

## 2018-02-17 NOTE — ED Notes (Addendum)
Pt presents to ED via POV with c/o "dragging to [her] leg", slurred speech intermittently and intermittent confusion. Pt is alert and oriented x4, mild slurring to speech. Pt states hx of 3 strokes at this time. Pt states symptoms started several days ago. Pt states L leg weakness at baseline from previous stroke.

## 2018-02-18 ENCOUNTER — Other Ambulatory Visit (HOSPITAL_COMMUNITY): Payer: Medicare Other

## 2018-02-18 ENCOUNTER — Inpatient Hospital Stay: Payer: Medicare Other

## 2018-02-18 ENCOUNTER — Inpatient Hospital Stay (HOSPITAL_COMMUNITY)
Admission: EM | Admit: 2018-02-18 | Discharge: 2018-02-18 | Disposition: A | Discharge status: Home / Self Care | Payer: Medicare Other | Source: Home / Self Care | Attending: Internal Medicine | Admitting: Internal Medicine

## 2018-02-18 DIAGNOSIS — I639 Cerebral infarction, unspecified: Secondary | ICD-10-CM

## 2018-02-18 DIAGNOSIS — I6350 Cerebral infarction due to unspecified occlusion or stenosis of unspecified cerebral artery: Secondary | ICD-10-CM

## 2018-02-18 LAB — GLUCOSE, CAPILLARY
Glucose-Capillary: 30 mg/dL — CL (ref 70–99)
Glucose-Capillary: 33 mg/dL — CL (ref 70–99)
Glucose-Capillary: 100 mg/dL — ABNORMAL HIGH (ref 70–99)
Glucose-Capillary: 58 mg/dL — ABNORMAL LOW (ref 70–99)
Glucose-Capillary: 61 mg/dL — ABNORMAL LOW (ref 70–99)
Glucose-Capillary: 64 mg/dL — ABNORMAL LOW (ref 70–99)
Glucose-Capillary: 73 mg/dL (ref 70–99)
Glucose-Capillary: 52 mg/dL — ABNORMAL LOW (ref 70–99)
Glucose-Capillary: 164 mg/dL — ABNORMAL HIGH (ref 70–99)

## 2018-02-18 LAB — PREGNANCY, URINE: Preg Test, Ur: NEGATIVE

## 2018-02-18 LAB — LIPID PANEL
Cholesterol: 161 mg/dL (ref 0–200)
HDL: 31 mg/dL — ABNORMAL LOW (ref >40)
LDL Cholesterol: 109 mg/dL — ABNORMAL HIGH (ref 0–99)
Total CHOL/HDL Ratio: 5.2 RATIO
Triglycerides: 103 mg/dL (ref <150)
VLDL: 21 mg/dL (ref 0–40)

## 2018-02-18 LAB — URINALYSIS, COMPLETE (UACMP) WITH MICROSCOPIC
Bilirubin Urine: NEGATIVE
Glucose, UA: NEGATIVE mg/dL
HGB URINE DIPSTICK: NEGATIVE
Ketones, ur: NEGATIVE mg/dL
Nitrite: NEGATIVE
PROTEIN: NEGATIVE mg/dL
Specific Gravity, Urine: 1.006 (ref 1.005–1.030)
pH: 5 (ref 5.0–8.0)

## 2018-02-18 LAB — ECHOCARDIOGRAM COMPLETE
Height: 62 in
Weight: 2783.09 oz

## 2018-02-18 LAB — HEMOGLOBIN A1C
Hgb A1c MFr Bld: 5.8 % — ABNORMAL HIGH (ref 4.8–5.6)
Mean Plasma Glucose: 119.76 mg/dL

## 2018-02-18 MED ORDER — ATORVASTATIN CALCIUM 20 MG PO TABS
10.0000 mg | ORAL_TABLET | Freq: Every day | ORAL | Status: DC
  Administered 2018-02-18: 19:00:00 10 mg via ORAL
  Filled 2018-02-18: qty 1

## 2018-02-18 MED ORDER — INSULIN ASPART 100 UNIT/ML ~~LOC~~ SOLN: 0.0000 [IU] | Freq: Three times a day (TID) | SUBCUTANEOUS | Status: DC

## 2018-02-18 MED ORDER — INSULIN ASPART 100 UNIT/ML ~~LOC~~ SOLN: 0.0000 [IU] | Freq: Every day | SUBCUTANEOUS | Status: DC

## 2018-02-18 NOTE — Evaluation (Signed)
Physical Therapy Evaluation Patient Details Name: [name] MRN: [id number] DOB: 07/29/1970 Today's Date: 02/18/2018   History of Present Illness   48 y.o. female  with ~1 week of slurred speech and L foot dragging during ambulation.  Imaging reveals sub-acute CVA.  Pt has some residual L sided weakness from previous CVA, feeling only minimally different from baseline.  History includes: anemia, aortic arch aneurysm, bipolar disorder, coronary artery disease and multiple catheterizations and stent placement, chronic kidney disease stage III, colon polyp, cerebrovascular accident with left-sided weakness, diabetes, familial breast cancer, hypertension, hyperlipidemia, hypothyroidism.  Clinical Impression  Pt with new onset L sided weakness that is mostly resolved over the last week, imaging reveals new subacute CVA.  Pt was slow and hesitant with most acts, but reports not being too far from her baseline and overall showed good effort and relative safety.  She did occasionally need HHA/UEs during ambulation but overall showed reasonable safety and confidence, with gait triaining/cuing apart from the exam.  She was able to negotiate up/down steps with reasonable safety, though attempts w/o any UE use were unsafe.  Pt will benefit from HHPT to work on functional limitations listed below and get back to baseline mobility.     Follow Up Recommendations Home health PT    Equipment Recommendations  (encouraged pt to use an AD initially in the home)    Recommendations for Other Services       Precautions / Restrictions Restrictions Weight Bearing Restrictions: No      Mobility  Bed Mobility Overal bed mobility: Independent             General bed mobility comments: Pt able to get herself to sitting at EOB w/o assist, w/o issue  Transfers Overall transfer level: Modified independent Equipment used: None             General transfer comment: Pt was able to rise and maintain  balance w/o UEs, initially with some hesitation and unsteadiness but no LOBs.    Ambulation/Gait Ambulation/Gait assistance: Min guard Gait Distance (Feet): 125 Feet Assistive device: None       General Gait Details: Pt is able to ambulate slowly, but safely in the hallway, she had some unsteadiness and was cautious but had no overt LOBs.  She did not have any L foot drop but did display poor general clearance on L and had decreased heel-toe coordination.  Pt initially wanting to use HHA but did manange most of the effort with only CGA and no UE use.  Stairs Stairs: Yes Stairs assistance: Min assist Stair Management: One rail Right Number of Stairs: 6 General stair comments: Pt was able to do a few steps with single HHA and no rail, did better and was more confident with b/l hands on rail using sideways strategy.    Wheelchair Mobility    Modified Rankin (Stroke Patients Only)       Balance Overall balance assessment: Independent                                           Pertinent Vitals/Pain Pain Assessment: 0-10 Pain Score: 9  Pain Location: reports L U&LE hurting a lot for the last week    [place] expects to be discharged to:: Private residence Living Arrangements: Children Available Help at Discharge: Family;Available PRN/intermittently Type of Home: House Home Access: Stairs to  enter Entrance Stairs-Rails: Right(no rails on the 4 back steps) Entrance Stairs-Number of Steps: 5 Home Layout: One level Home Equipment: Walker - 2 wheels;Cane - single point      Prior Function Level of Independence: Needs assistance   Gait / Transfers Assistance Needed: holds onto son during mobility, doesn't use AD           Hand Dominance   Dominant Hand: Right    Extremity/Trunk Assessment   Upper Extremity Assessment Upper Extremity Assessment: LUE deficits/detail LUE Deficits / Details: L grossly 3+/5 in available range, reports  she feels she is essentially back to her baseline (limited from previous CVA), coordination appears roughly similar to R    Lower Extremity Assessment Lower Extremity Assessment: LLE deficits/detail(R grossly 4/5, L grossly 3+/5, DF weak but full AROM)       Communication   Communication: No difficulties(no slurred speech noted during PT exam)  Cognition Arousal/Alertness: Awake/alert Behavior During Therapy: WFL for tasks assessed/performed Overall Cognitive Status: Within Functional Limits for tasks assessed                                        General Comments      Exercises     Assessment/Plan    PT Assessment Patient needs continued PT services  PT Problem List Decreased strength;Decreased range of motion;Decreased activity tolerance;Decreased balance;Decreased mobility;Decreased coordination;Decreased safety awareness;Pain       PT Treatment Interventions DME instruction;Gait training;Stair training;Functional mobility training;Therapeutic activities;Therapeutic exercise;Balance training;Neuromuscular re-education;Patient/family education    PT Goals (Current goals can be found in the Care Plan section)  Acute Rehab PT Goals Patient Stated Goal: go home, get back to independence PT Goal Formulation: With patient Time For Goal Achievement: 03/04/18 Potential to Achieve Goals: Good    Frequency 7X/week   Barriers to discharge        Co-evaluation               AM-PAC PT "6 Clicks" Mobility  Outcome Measure Help needed turning from your back to your side while in a flat bed without using bedrails?: None Help needed moving from lying on your back to sitting on the side of a flat bed without using bedrails?: None Help needed moving to and from a bed to a chair (including a wheelchair)?: None Help needed standing up from a chair using your arms (e.g., wheelchair or bedside chair)?: None Help needed to walk in hospital room?: A Little Help  needed climbing 3-5 steps with a railing? : A Little 6 Click Score: 22    End of Session Equipment Utilized During Treatment: Gait belt Activity Tolerance: Patient tolerated treatment well Patient left: with call bell/phone within reach;in bed Nurse Communication: Mobility status PT Visit Diagnosis: Muscle weakness (generalized) (M62.81);Difficulty in walking, not elsewhere classified (R26.2);Unsteadiness on feet (R26.81)    Time: 9702-6378 PT Time Calculation (min) (ACUTE ONLY): 25 min   Charges:   PT Evaluation $PT Eval Low Complexity: 1 Low PT Treatments $Gait Training: 8-22 mins        Kreg Shropshire, DPT 02/18/2018, 9:57 AM

## 2018-02-18 NOTE — Evaluation (Signed)
Speech Language Pathology Evaluation Patient Details Name: [name] MRN: [id number] DOB: 07/08/1970 Today's Date: 02/18/2018 Time: 6270-3500 SLP Time Calculation (min) (ACUTE ONLY): 35 min  Problem List:  Patient Active Problem List   Diagnosis Date Noted  . MI, acute, non ST segment elevation ([place])   . Effort angina ([place]) 06/29/2017  . Unstable angina ([place]) 06/24/2017  . Syncope 04/09/2017  . Insomnia 03/18/2017  . Ischemic cardiomyopathy   . Arthritis   . Anxiety   . Acute colitis 01/27/2017  . Hx of colonic polyps   . Bariatric surgery status   . Sinus tachycardia 02/27/2016  . H/O medication noncompliance 12/14/2015  . Acute pyelonephritis   . Emesis   . CKD (chronic kidney disease), stage IV ([place])   . Atherosclerosis of native coronary artery of native heart with stable angina pectoris ([place])   . Hypertensive heart disease   . CKD (chronic kidney disease), stage III ([place]) 06/05/2015  . Presence of drug coated stent in LAD coronary artery & now in D1 06/04/2015  . Colitis 06/03/2015  . Carotid stenosis 04/30/2015  . Type 2 diabetes mellitus with stage 3 chronic kidney disease, without long-term current use of insulin ([place])   . Atypical chest pain   . Stable angina pectoris ([place]) 04/17/2015  . Iron deficiency anemia 03/22/2015  . Vitamin B12 deficiency 02/18/2015  . Misuse of medications for pain 02/18/2015  . Severe recurrent major depression without psychotic features ([place]) 02/15/2015  . Helicobacter pylori infection 11/23/2014  . Hemiparesis, left ([place]) 11/23/2014  . Benign neoplasm of colon 11/20/2014  . Malignant melanoma ([place]) 08/25/2014  . Chronic systolic CHF (congestive heart failure) ([place])   . Incomplete bladder emptying 07/12/2014  . Adult hypothyroidism 12/30/2013  . Aberrant subclavian artery 11/17/2013  . Multiple sclerosis ([place]) 11/02/2013  . History of CVA (cerebrovascular accident) 06/20/2013  . Headache, migraine 05/29/2013  .  Hyperlipidemia   . GERD (gastroesophageal reflux disease)   . Neuropathy ([place]) 01/02/2011  . Stroke ([place]) 06/21/2008  . Essential hypertension 05/01/2008   Past Medical History:  Past Medical History:  Diagnosis Date  . Anemia    iron deficiency anemia  . Aortic arch aneurysm ([place])   . Bipolar disorder ([place])   . BRCA negative 2014  . CAD (coronary artery disease)    a. 08/2003 Cath: LAD 30-40-med Rx; b. 11/2014 PCI: LAD 67m(3.25x23 Xience Alpine DES); c. 06/2015 PCI: D1 (2.25x12 Resolute Integrity DES); d. 06/2017 PCI: Patent mLAD stent, D2 95 (PTCA); e. 09/2017 Cath/PCI: LM nl, LAD 356m70d, D1 40, D2 99ost (CBA), LCX 40p, RCA 40ost/p.  . Marland KitchenKD (chronic kidney disease), stage III (HCHousat[place]  . Colon polyp   . CVA (cerebral vascular accident) ([place]   Left side weakness.   . Diabetes ([place]  . Family history of breast cancer    BRCA neg 2014  . Gastric ulcer 04/27/2011  . History of echocardiogram    a. 03/2017 Echo: EF 60-65%, no rwma.  . Marland KitchenTN (hypertension)   . Hyperlipemia   . Hypothyroid   . Malignant melanoma of skin of scalp (HCF[facility] [place]  . MI, acute, non ST segment elevation (HCJal[place]  . Neuromuscular disorder ([facility]  . S/P drug eluting coronary stent placement 06/04/2015  . Sepsis (HCDecatur2/14/2019   Past Surgical History:  Past Surgical History:  Procedure Laterality Date  . APPENDECTOMY    . CARDIAC CATHETERIZATION N/A 11/09/2014   Procedure: Coronary Angiography;  Surgeon: TiChristia Reading  Gloriajean Dell, MD;  Location: [place] [facility];  Service: Cardiovascular;  Laterality: N/A;  . CARDIAC CATHETERIZATION N/A 11/12/2014   Procedure: Coronary Stent Intervention;  Surgeon: Isaias Cowman, MD;  Location: [place] [facility];  Service: Cardiovascular;  Laterality: N/A;  . CARDIAC CATHETERIZATION N/A 04/18/2015   Procedure: Left Heart Cath and Coronary Angiography;  Surgeon: Minna Merritts, MD;  Location: [place] [facility];  Service: Cardiovascular;  Laterality: N/A;  . CARDIAC  CATHETERIZATION Left 06/04/2015   Procedure: Left Heart Cath and Coronary Angiography;  Surgeon: Wellington Hampshire, MD;  Location: [place] [facility];  Service: Cardiovascular;  Laterality: Left;  . CARDIAC CATHETERIZATION N/A 06/04/2015   Procedure: Coronary Stent Intervention;  Surgeon: Wellington Hampshire, MD;  Location: [place];  Service: Cardiovascular;  Laterality: N/A;  . CESAREAN SECTION  2001  . CHOLECYSTECTOMY N/A 11/18/2016   Procedure: LAPAROSCOPIC CHOLECYSTECTOMY WITH INTRAOPERATIVE CHOLANGIOGRAM;  Surgeon: Christene Lye, MD;  Location: [facility];  Service: General;  Laterality: N/A;  . COLONOSCOPY WITH PROPOFOL N/A 04/27/2016   Procedure: COLONOSCOPY WITH PROPOFOL;  Surgeon: [name], MD;  Location: [facility];  Service: Endoscopy;  Laterality: N/A;  . COLONOSCOPY WITH PROPOFOL N/A 01/12/2018   Procedure: COLONOSCOPY WITH PROPOFOL;  Surgeon: Toledo, Benay Pike, MD;  Location: [facility];  Service: Endoscopy;  Laterality: N/A;  . CORONARY ANGIOPLASTY    . CORONARY BALLOON ANGIOPLASTY N/A 06/29/2017   Procedure: CORONARY BALLOON ANGIOPLASTY;  Surgeon: Wellington Hampshire, MD;  Location: [place] [facility];  Service: Cardiovascular;  Laterality: N/A;  . CORONARY BALLOON ANGIOPLASTY N/A 09/20/2017   Procedure: CORONARY BALLOON ANGIOPLASTY;  Surgeon: Wellington Hampshire, MD;  Location: [place];  Service: Cardiovascular;  Laterality: N/A;  . DILATION AND CURETTAGE OF UTERUS    . ESOPHAGOGASTRODUODENOSCOPY (EGD) WITH PROPOFOL N/A 09/14/2014   Procedure: ESOPHAGOGASTRODUODENOSCOPY (EGD) WITH PROPOFOL;  Surgeon: Josefine Class, MD;  Location: [facility];  Service: Endoscopy;  Laterality: N/A;  . ESOPHAGOGASTRODUODENOSCOPY (EGD) WITH PROPOFOL N/A 04/27/2016   Procedure: ESOPHAGOGASTRODUODENOSCOPY (EGD) WITH PROPOFOL;  Surgeon: [name], MD;  Location: [facility];  Service: Endoscopy;  Laterality: N/A;  Diabetic - oral meds  .  ESOPHAGOGASTRODUODENOSCOPY (EGD) WITH PROPOFOL N/A 01/12/2018   Procedure: ESOPHAGOGASTRODUODENOSCOPY (EGD) WITH PROPOFOL;  Surgeon: Toledo, Benay Pike, MD;  Location: [facility];  Service: Endoscopy;  Laterality: N/A;  . GASTRIC BYPASS  09/2009   [facility]   . Left Carotid to sublcavian artery bypass w/ subclavian artery ligation     a. Performed @ [place] [place].  . LEFT HEART CATH AND CORONARY ANGIOGRAPHY Left 06/29/2017   Procedure: LEFT HEART CATH AND CORONARY ANGIOGRAPHY;  Surgeon: Wellington Hampshire, MD;  Location: [place] [facility];  Service: Cardiovascular;  Laterality: Left;  . LEFT HEART CATH AND CORONARY ANGIOGRAPHY N/A 09/20/2017   Procedure: LEFT HEART CATH AND CORONARY ANGIOGRAPHY;  Surgeon: Wellington Hampshire, MD;  Location: [place] [facility];  Service: Cardiovascular;  Laterality: N/A;  . LEFT HEART CATH AND CORONARY ANGIOGRAPHY N/A 12/20/2017   Procedure: LEFT HEART CATH AND CORONARY ANGIOGRAPHY;  Surgeon: Wellington Hampshire, MD;  Location: [place] [facility];  Service: Cardiovascular;  Laterality: N/A;  . MELANOMA EXCISION  2016   Dr. Evorn Gong  . [place]  2002  . RIGHT OOPHORECTOMY    . SHOULDER ARTHROSCOPY WITH OPEN ROTATOR CUFF REPAIR Right 01/07/2016   Procedure: SHOULDER ARTHROSCOPY WITH DEBRIDMENT, SUBACHROMIAL DECOMPRESSION;  Surgeon: Marshall Cork  Poggi, MD;  Location: [facility];  Service: Orthopedics;  Laterality: Right;  . SHOULDER ARTHROSCOPY WITH OPEN ROTATOR CUFF REPAIR Right 03/16/2017   Procedure: SHOULDER ARTHROSCOPY WITH OPEN ROTATOR CUFF REPAIR POSSIBLE BICEPS TENODESIS;  Surgeon: Corky Mull, MD;  Location: [facility];  Service: Orthopedics;  Laterality: Right;  . TRIGGER FINGER RELEASE Right     [place]   HPI:  HPI on admission 02/17/2018: "[name]  is a 48 y.o. female with a known history of anemia, aortic arch aneurysm, bipolar disorder, coronary artery disease and multiple catheterizations and stent placement and suggested  to her medical management a few months ago, chronic kidney disease stage III, colon polyp, cerebrovascular accident with left-sided weakness, diabetes, familial breast cancer, hypertension, hyperlipidemia, hypothyroidism-was taking all her medications as prescribed.  For last 1 week she is more weak on her left leg and almost dragging her feet while walking which is new for her and also have new slurred speech for 1 week.  As per her she has multiple medical issues and she does not come to the hospital for minor things so she decided to avoid coming to emergency room for few days but finally her son made her to come to get checked out.  She was noted to have acute to subacute stroke on MRI and so advised to admit to hospitalist service for further management."   Assessment / Plan / Recommendation Clinical Impression  The patient is presenting with mild dysarthria of speech characterized by slowed rate, minimally imprecise articulation, and reduced breath support for speech.  Her speech is fully intelligible.  The patient is presenting with language/cognition/swallowing within normal limits.  She continues to complain of esophageal symptoms.  If speech symptoms persist, the patient may benefit from referral to home health or outpatient speech therapy for dysarthria.     SLP Assessment  SLP Recommendation/Assessment: Patient needs continued Speech Lanaguage Pathology Services SLP Visit Diagnosis: Dysarthria and anarthria (R47.1)    Follow Up Recommendations  Home health SLP;Outpatient SLP;Other (comment)(If needed)    Frequency and Duration min 1 x/week  1 week      SLP Evaluation Cognition  Overall Cognitive Status: Within Functional Limits for tasks assessed Orientation Level: Oriented X4       Comprehension  Auditory Comprehension Overall Auditory Comprehension: Appears within functional limits for tasks assessed    Expression Verbal Expression Overall Verbal Expression: Appears within  functional limits for tasks assessed   Oral / Motor  Oral Motor/Sensory Function Overall Oral Motor/Sensory Function: Mild impairment Motor Speech Overall Motor Speech: Impaired Respiration: Impaired Level of Impairment: Conversation Phonation: Normal Resonance: Within functional limits Articulation: Impaired Level of Impairment: Conversation Intelligibility: Intelligible Motor Planning: Witnin functional limits   GO                   Leroy Sea, MS/CCC- SLP  Valetta Fuller, Susie 02/18/2018, 1:19 PM

## 2018-02-18 NOTE — Progress Notes (Signed)
Pt with Abbott free-style blood glucose monitoring system-was thrown away prior to MRI per patient and discussion with nursing staff, per nursing staff-patient's blood sugar always bottoms out/drops very low with sliding scale insulin coverage, sliding scale insulin was subsequently discontinued, it was recommended that we try to find her blood glucose monitoring device for better glucose control.

## 2018-02-18 NOTE — Progress Notes (Signed)
Pt actively refusing bed alarm. Educated by myself, assigned RN, and Linus Galas, RN. Refusal stands.

## 2018-02-18 NOTE — Progress Notes (Signed)
OT Cancellation Note  Patient Details Name: [name] MRN: [id number] DOB: Apr 09, 1970   Cancelled Treatment:    Reason Eval/Treat Not Completed: Patient at procedure or test/ unavailable. Order received, chart reviewed. Upon initial attempt, transport came at start of evaluation to take pt to MRI. Will re-attempt OT evaluation at later date/time as pt is available and medically appropriate.  Jeni Salles, MPH, MS, OTR/L ascom [phone] 02/18/18, 1:33 PM

## 2018-02-18 NOTE — Consult Note (Signed)
Referring Physician: Dr. Anselm Jungling    Reason for Consult: subacute L frontal stroke   HPI: Kendra [address] [name] [name] is an 48 y.o. female with complicated  past medical history significant for coronary artery disease status post cardiac stent on aspirin Plavix, hypertension, chronic kidney disease stage III, hyperlipidemia, prior stroke with left-sided weakness residual who presented to the hospital complaining about slurred speech, Right sided weakness, started on Monday. Patient reported that on Monday she noticed that her speech was little slurred and she was complaining about R lower extremity weakness and numbness and pain, she did not pay much attention because she thought even if she had a stroke there is nothing to be done because she had them before and she is on aspirin Plavix, her symptoms was increasing throughout the week and she called Dr. Manuella Ghazi who recommended that she will come to the emergency room for stroke evaluation, in the emergency room patient blood pressure was okay CT scan of the brain shows no acute changes she was admitted for stroke work-up, MRI of the brain shows subacute right frontal stroke. Patient stated that she takes aspirin Plavix daily and she is compliant with her medication, she also Takes Crestor and she is compliant with her cholesterol medication as well. Patient denies any fever, no shortness of breath, no chest pain, no change in her vision, no nausea or vomiting.  tPA Given: no   Past Medical History Past Medical History:  Diagnosis Date  . Anemia    iron deficiency anemia  . Aortic arch aneurysm ([place])   . Bipolar disorder ([place])   . BRCA negative 2014  . CAD (coronary artery disease)    a. 08/2003 Cath: LAD 30-40-med Rx; b. 11/2014 PCI: LAD 59m(3.25x23 Xience Alpine DES); c. 06/2015 PCI: D1 (2.25x12 Resolute Integrity DES); d. 06/2017 PCI: Patent mLAD stent, D2 95 (PTCA); e. 09/2017 Cath/PCI: LM nl, LAD 361m70d, D1 40, D2 99ost (CBA), LCX 40p, RCA 40ost/p.  . Marland KitchenKD  (chronic kidney disease), stage III (HC[facility]asc[facility]  . Colon polyp   . CVA (cerebral vascular accident) ([facility] [place]   Left side weakness.   . Diabetes ([place]  . Family history of breast cancer    BRCA neg 2014  . Gastric ulcer 04/27/2011  . History of echocardiogram    a. 03/2017 Echo: EF 60-65%, no rwma.  . Marland KitchenTN (hypertension)   . Hyperlipemia   . Hypothyroid   . Malignant melanoma of skin of scalp (HC[facility]  . MI, acute, non ST segment elevation ([facility]  . Neuromuscular disorder (HCLuna  . S/P drug eluting coronary stent placement 06/04/2015  . Sepsis (HCStevens2/14/2019    Surgical History Past Surgical History:  Procedure Laterality Date  . APPENDECTOMY    . CARDIAC CATHETERIZATION N/A 11/09/2014   Procedure: Coronary Angiography;  Surgeon: TiMinna MerrittsMD;  [facility];  Service: Cardiovascular;  Laterality: N/A;  . CARDIAC CATHETERIZATION N/A 11/12/2014   Procedure: Coronary Stent Intervention;  Surgeon: AlIsaias CowmanMD;  [place];  Service: Cardiovascular;  Laterality: N/A;  . CARDIAC CATHETERIZATION N/A 04/18/2015   Procedure: Left Heart Cath and Coronary Angiography;  Surgeon: TiMinna MerrittsMD;  [facility];  Service: Cardiovascular;  Laterality: N/A;  . CARDIAC CATHETERIZATION Left 06/04/2015   Procedure: Left Heart Cath and Coronary Angiography;  Surgeon: MuWellington HampshireMD;  [facility];  Service: Cardiovascular;  Laterality: Left;  .  CARDIAC CATHETERIZATION N/A 06/04/2015   Procedure: Coronary Stent Intervention;  Surgeon: Wellington Hampshire, MD;  Location: [place] [facility] LAB;  Service: Cardiovascular;  Laterality: N/A;  . CESAREAN SECTION  2001  . CHOLECYSTECTOMY N/A 11/18/2016   Procedure: LAPAROSCOPIC CHOLECYSTECTOMY WITH INTRAOPERATIVE CHOLANGIOGRAM;  Surgeon: Christene Lye, MD;  Location: [facility];  Service: General;  Laterality: N/A;  . COLONOSCOPY WITH PROPOFOL N/A 04/27/2016    Procedure: COLONOSCOPY WITH PROPOFOL;  Surgeon: [name], MD;  Location: [place];  Service: Endoscopy;  Laterality: N/A;  . COLONOSCOPY WITH PROPOFOL N/A 01/12/2018   Procedure: COLONOSCOPY WITH PROPOFOL;  Surgeon: Toledo, Benay Pike, MD;  Location: [facility];  Service: Endoscopy;  Laterality: N/A;  . CORONARY ANGIOPLASTY    . CORONARY BALLOON ANGIOPLASTY N/A 06/29/2017   Procedure: CORONARY BALLOON ANGIOPLASTY;  Surgeon: Wellington Hampshire, MD;  Location: [place] [facility] LAB;  Service: Cardiovascular;  Laterality: N/A;  . CORONARY BALLOON ANGIOPLASTY N/A 09/20/2017   Procedure: CORONARY BALLOON ANGIOPLASTY;  Surgeon: Wellington Hampshire, MD;  Location: [place] CV LAB;  Service: Cardiovascular;  Laterality: N/A;  . DILATION AND CURETTAGE OF UTERUS    . ESOPHAGOGASTRODUODENOSCOPY (EGD) WITH PROPOFOL N/A 09/14/2014   Procedure: ESOPHAGOGASTRODUODENOSCOPY (EGD) WITH PROPOFOL;  Surgeon: Josefine Class, MD;  [facility];  Service: Endoscopy;  Laterality: N/A;  . ESOPHAGOGASTRODUODENOSCOPY (EGD) WITH PROPOFOL N/A 04/27/2016   Procedure: ESOPHAGOGASTRODUODENOSCOPY (EGD) WITH PROPOFOL;  Surgeon: [name], MD;  Location: [place];  Service: Endoscopy;  Laterality: N/A;  Diabetic - oral meds  . ESOPHAGOGASTRODUODENOSCOPY (EGD) WITH PROPOFOL N/A 01/12/2018   Procedure: ESOPHAGOGASTRODUODENOSCOPY (EGD) WITH PROPOFOL;  Surgeon: Toledo, Benay Pike, MD;  Location: [facility];  Service: Endoscopy;  Laterality: N/A;  . GASTRIC BYPASS  09/2009   [facility]   . Left Carotid to sublcavian artery bypass w/ subclavian artery ligation     a. Performed @ [place].  . LEFT HEART CATH AND CORONARY ANGIOGRAPHY Left 06/29/2017   Procedure: LEFT HEART CATH AND CORONARY ANGIOGRAPHY;  Surgeon: Wellington Hampshire, MD;  Location: [place] CV LAB;  Service: Cardiovascular;  Laterality: Left;  . LEFT HEART CATH AND CORONARY ANGIOGRAPHY N/A 09/20/2017    Procedure: LEFT HEART CATH AND CORONARY ANGIOGRAPHY;  Surgeon: Wellington Hampshire, MD;  Location: [place] CV LAB;  Service: Cardiovascular;  Laterality: N/A;  . LEFT HEART CATH AND CORONARY ANGIOGRAPHY N/A 12/20/2017   Procedure: LEFT HEART CATH AND CORONARY ANGIOGRAPHY;  Surgeon: Wellington Hampshire, MD;  Location: [place] CV LAB;  Service: Cardiovascular;  Laterality: N/A;  . MELANOMA EXCISION  2016   Dr. Evorn Gong  . [place]  2002  . RIGHT OOPHORECTOMY    . SHOULDER ARTHROSCOPY WITH OPEN ROTATOR CUFF REPAIR Right 01/07/2016   Procedure: SHOULDER ARTHROSCOPY WITH DEBRIDMENT, SUBACHROMIAL DECOMPRESSION;  Surgeon: Corky Mull, MD;  Location: [facility];  Service: Orthopedics;  Laterality: Right;  . SHOULDER ARTHROSCOPY WITH OPEN ROTATOR CUFF REPAIR Right 03/16/2017   Procedure: SHOULDER ARTHROSCOPY WITH OPEN ROTATOR CUFF REPAIR POSSIBLE BICEPS TENODESIS;  Surgeon: Corky Mull, MD;  Location: [facility];  Service: Orthopedics;  Laterality: Right;  . TRIGGER FINGER RELEASE Right     [place]    Family History  Family History  Problem Relation Age of Onset  . Hypertension Mother   . Anxiety disorder Mother   . Depression Mother   . Bipolar disorder Mother   . Heart disease Mother  No details  . Hyperlipidemia Mother   . Kidney disease Father   . Heart disease Father 81  . Hypertension Father   . Diabetes Father   . Stroke Father   . Colon cancer Father        dx in his 5's  . Anxiety disorder Father   . Depression Father   . Skin cancer Father   . Kidney disease Sister   . Thyroid nodules Sister   . Hypertension Sister   . Hypertension Sister   . Diabetes Sister   . Hyperlipidemia Sister   . Depression Sister   . Breast cancer Maternal Aunt 79  . Breast cancer Maternal Aunt 11  . Ovarian cancer Cousin   . Colon cancer Cousin   . Breast cancer Other   . Kidney cancer Neg Hx   . Bladder Cancer Neg Hx     Social History:   reports that she quit  smoking about 23 years ago. Her smoking use included cigarettes. She has never used smokeless tobacco. She reports that she does not drink alcohol or use drugs.  Allergies:  Allergies  Allergen Reactions  . Lipitor [Atorvastatin] Other (See Comments)    Leg pains  . Tramadol Other (See Comments)    Mouth feels like it's on fire    Home Medications:  Medications Prior to Admission  Medication Sig Dispense Refill  . ALPRAZolam (XANAX) 1 MG tablet TAKE 1 TABLET (1 MG TOTAL) BY MOUTH DAILY AS NEEDED FOR ANXIETY. 30 tablet 5  . amLODipine (NORVASC) 5 MG tablet Take 1 tablet (5 mg total) by mouth daily. 90 tablet 6  . aspirin 81 MG chewable tablet Chew 1 tablet (81 mg total) by mouth daily. 30 tablet 11  . Blood Glucose Monitoring Suppl (ONE TOUCH ULTRA 2) w/Device KIT Use to check blood sugar once a day. Dx. E11.9 1 each 0  . buPROPion (WELLBUTRIN XL) 150 MG 24 hr tablet TAKE 1 TABLET BY MOUTH DAILY 30 tablet 11  . citalopram (CELEXA) 40 MG tablet Take 1 tablet (40 mg total) by mouth daily. 30 tablet 5  . clopidogrel (PLAVIX) 75 MG tablet Take 1 tablet (75 mg total) by mouth daily with breakfast. 30 tablet 2  . Cyanocobalamin (VITAMIN B 12 PO) Take 1,000 mcg by mouth daily.     Marland Kitchen gabapentin (NEURONTIN) 300 MG capsule TAKE 1 CAPSULE BY MOUTH TWICE A DAY (Patient taking differently: Take 300 mg by mouth 2 (two) times daily. ) 60 capsule 5  . isosorbide mononitrate (IMDUR) 120 MG 24 hr tablet Take 1 tablet (120 mg total) by mouth daily. 90 tablet 1  . levothyroxine (SYNTHROID, LEVOTHROID) 25 MCG tablet Take 1 tablet (25 mcg total) daily before breakfast by mouth. 30 tablet 12  . losartan (COZAAR) 25 MG tablet Take 1/2 tablet (12.5 mg) by mouth once daily (Patient taking differently: Take 25 mg by mouth daily. ) 45 tablet 1  . metoprolol succinate (TOPROL-XL) 50 MG 24 hr tablet Take 1 tablet (50 mg total) by mouth daily. Take with or immediately following a meal. 30 tablet 3  . pantoprazole  (PROTONIX) 40 MG tablet Take 1 tablet (40 mg total) by mouth 2 (two) times daily. 60 tablet 5  . promethazine (PHENERGAN) 25 MG tablet Take 1 tablet (25 mg total) by mouth every 6 (six) hours as needed for nausea or vomiting. 30 tablet 3  . rosuvastatin (CRESTOR) 10 MG tablet Take 1 tablet (10 mg total) by mouth daily. [place]  tablet 11  . hyoscyamine (LEVSIN SL) 0.125 MG SL tablet Place 1 tablet (0.125 mg total) under the tongue every 6 (six) hours as needed. (Patient not taking: Reported on 02/17/2018) 60 tablet 0  . nitroGLYCERIN (NITROSTAT) 0.4 MG SL tablet Place 1 tablet (0.4 mg total) under the tongue every 5 (five) minutes as needed for chest pain. 25 tablet 2  . pioglitazone (ACTOS) 15 MG tablet Take 15 mg by mouth daily.    Marland Kitchen pyridostigmine (MESTINON) 60 MG tablet Take 60 mg by mouth 3 (three) times daily.       Hospital Medications . amLODipine  5 mg Oral Daily  . aspirin  324 mg Oral Daily  . buPROPion  150 mg Oral Daily  . citalopram  40 mg Oral Daily  . clopidogrel  75 mg Oral Q breakfast  . enoxaparin (LOVENOX) injection  30 mg Subcutaneous Q24H  . gabapentin  300 mg Oral BID  . insulin aspart  0-5 Units Subcutaneous QHS  . insulin aspart  0-9 Units Subcutaneous TID WC  . isosorbide mononitrate  120 mg Oral Daily  . levothyroxine  25 mcg Oral QAC breakfast  . metoprolol succinate  50 mg Oral Daily  . pantoprazole  40 mg Oral BID  . rosuvastatin  10 mg Oral Daily    ROS:  Negative except of what is reported in HPI     Physical Examination:  Vitals:   02/18/18 0354 02/18/18 0632 02/18/18 0759 02/18/18 1000  BP: (!) 165/77 (!) 157/84 (!) 161/79 (!) 145/78  Pulse: (!) 49 60 60 67  Resp: _0 Temp: 97.6 F (36.4 C) 98.5 F (36.9 C) 98.2 F (36.8 C) 98.3 F (36.8 C)  TempSrc: Oral Oral Oral Oral  SpO2: 99% 100% 100% 100%  Weight:      Height:        General -  Heart - Regular rate and rhythm - no murmer appreciated Lungs - Clear to auscultation  Abdomen -  Soft - non tender Extremities - Distal pulses intact - no edema Skin - Warm and dry   Neurologic Examination:  Mental Status: Alert, oriented, thought content appropriate.  Speech fluent without evidence of aphasia. Able to follow 3 step commands without difficulty. Cranial Nerves: II: Discs not visualized; Visual fields grossly normal, pupils equal, round, reactive to light III,IV, VI: ptosis not present, extra-ocular motions intact bilaterally V,VII: smile symmetric, facial light touch sensation normal bilaterally VIII: hearing normal bilaterally IX,X: gag reflex present XI: bilateral shoulder shrug XII: midline tongue extension Motor: RUE -4/5    LUE - 5/5   RLE - 4/5    LLE - 4/5  Sensory: Decreased on R Deep Tendon Reflexes: 2+ and symmetric throughout Plantars: Right: downgoing   Left: downgoing Cerebellar: normal finger-to-nose and normal heel-to-shin test    NIHSS 1a Level of Conscious: 1b LOC Questions:  1c LOC Commands:  2 Best Gaze:  3 Visual:  4 Facial Palsy:  5a Motor Arm - left:  5b Motor Arm - Right:  6a Motor Leg - Left:  6b Motor Leg - Right:  7 Limb Ataxia:  8 Sensory:  9 Best Language:  10 Dysarthria: 11 Extinct. and Inattention: TOTAL: 3   LABORATORY STUDIES:  Basic Metabolic Panel: Recent Labs  Lab 02/17/18 1451  NA 138  K 4.2  CL 113*  CO2 18*  GLUCOSE 78  BUN 22*  CREATININE 1.81*  CALCIUM 8.5*    Liver Function Tests: Recent Labs  Lab 02/17/18 1451  AST 19  ALT 17  ALKPHOS 128*  BILITOT 0.6  PROT 6.8  ALBUMIN 3.6   No results for input(s): LIPASE, AMYLASE in the last 168 hours. No results for input(s): AMMONIA in the last 168 hours.  CBC: Recent Labs  Lab 02/17/18 1451  WBC 8.6  NEUTROABS 6.2  HGB 11.1*  HCT 34.7*  MCV 81.6  PLT 196    Cardiac Enzymes: Recent Labs  Lab 02/17/18 1451  TROPONINI <0.03    BNP: Invalid input(s): POCBNP  CBG: Recent Labs  Lab 02/18/18 0440 02/18/18 0456  02/18/18 0511 02/18/18 0805 02/18/18 1157  GLUCAP 58* 61* 100* 64* 73    Microbiology:   Coagulation Studies: Recent Labs    02/17/18 1451  LABPROT 13.1  INR 1.00    Urinalysis:  Recent Labs  Lab 02/18/18 0500  COLORURINE YELLOW*  LABSPEC 1.006  PHURINE 5.0  GLUCOSEU NEGATIVE  HGBUR NEGATIVE  BILIRUBINUR NEGATIVE  KETONESUR NEGATIVE  PROTEINUR NEGATIVE  NITRITE NEGATIVE  LEUKOCYTESUR SMALL*    Lipid Panel:     Component Value Date/Time   CHOL 161 02/18/2018 0313   CHOL 159 05/28/2016 1212   CHOL 197 12/30/2013 0355   TRIG 103 02/18/2018 0313   TRIG 261 (H) 12/30/2013 0355   HDL 31 (L) 02/18/2018 0313   HDL 34 (L) 05/28/2016 1212   HDL 28 (L) 12/30/2013 0355   CHOLHDL 5.2 02/18/2018 0313   VLDL 21 02/18/2018 0313   VLDL 52 (H) 12/30/2013 0355   LDLCALC 109 (H) 02/18/2018 0313   LDLCALC 103 (H) 12/08/2016 1221   LDLCALC 117 (H) 12/30/2013 0355    HgbA1C:  Lab Results  Component Value Date   HGBA1C 5.8 (H) 02/18/2018    Urine Drug Screen:      Component Value Date/Time   LABOPIA POSITIVE (A) 06/04/2015 1153   LABOPIA POSITIVE (A) 07/10/2009 2040   COCAINSCRNUR NONE DETECTED 06/04/2015 1153   LABBENZ NONE DETECTED 06/04/2015 1153   LABBENZ NONE DETECTED 07/10/2009 2040   AMPHETMU NONE DETECTED 06/04/2015 1153   AMPHETMU NONE DETECTED 07/10/2009 2040   THCU NONE DETECTED 06/04/2015 1153   THCU NONE DETECTED 07/10/2009 2040   LABBARB NONE DETECTED 06/04/2015 1153   LABBARB  07/10/2009 2040    NONE DETECTED        DRUG SCREEN FOR MEDICAL PURPOSES ONLY.  IF CONFIRMATION IS NEEDED FOR ANY PURPOSE, NOTIFY LAB WITHIN 5 DAYS.        LOWEST DETECTABLE LIMITS FOR URINE DRUG SCREEN Drug Class       Cutoff (ng/mL) Amphetamine      1000 Barbiturate      200 Benzodiazepine   503 Tricyclics       546 Opiates          300 Cocaine          300 THC              50     Alcohol Level:  No results for input(s): ETH in the last 168  hours.  Miscellaneous labs:  EKG  EKG   IMAGING: Dg Chest 2 View  Result Date: 02/18/2018 CLINICAL DATA:  Stroke EXAM: CHEST - 2 VIEW COMPARISON:  12/23/2017 FINDINGS: Lungs are clear.  No pleural effusion or pneumothorax. The heart is normal in size. Surgical clips along the left superior mediastinum. Mild degenerative changes of the visualized thoracolumbar spine. IMPRESSION: Normal chest radiographs. Electronically Signed   By: Henderson Newcomer.D.  On: 02/18/2018 00:05   Ct Head Wo Contrast  Result Date: 02/17/2018 CLINICAL DATA:  Dragging LEFT leg for a couple days, fatigue for 1 week, change in speech habits. History of coronary artery disease post MI, diabetes, hypertension. EXAM: CT HEAD WITHOUT CONTRAST TECHNIQUE: Contiguous axial images were obtained from the base of the skull through the vertex without intravenous contrast. Sagittal and coronal MPR images reconstructed from axial data set. COMPARISON:  12/23/2017 FINDINGS: Brain: Generalized atrophy. Normal ventricular morphology. No midline shift or mass effect. Small vessel chronic ischemic changes of deep cerebral white matter. Old lacunar infarcts at genu of RIGHT internal capsule and LEFT thalamus. No intracranial hemorrhage, mass lesion, evidence of acute infarction, or extra-axial fluid collection. Vascular: Atherosclerotic calcifications of internal carotid and vertebral arteries at skull base. No definite hyperdense vessels. Skull: Intact Sinuses/Orbits: Clear Other: N/A IMPRESSION: Atrophy with small vessel chronic ischemic changes of deep cerebral white matter. Old lacunar infarcts LEFT thalamus and genu of RIGHT internal capsule. No acute intracranial abnormalities. Electronically Signed   By: Lavonia Dana M.D.   On: 02/17/2018 15:24   Mr Brain Wo Contrast  Result Date: 02/17/2018 CLINICAL DATA:  47 y/o F; slurred speech intermittently and confusion. Dragging of the left leg. EXAM: MRI HEAD WITHOUT CONTRAST TECHNIQUE:  Multiplanar, multiecho pulse sequences of the brain and surrounding structures were obtained without intravenous contrast. COMPARISON:  02/17/2018 CT head.  04/10/2017 MRI head. FINDINGS: Brain: 7 mm focus of mildly reduced diffusion within the left medial frontal white matter with T2 FLAIR hyperintense signal abnormality (series 2, image 38) compatible with late acute to early subacute infarction. No hemorrhage or mass effect. Small chronic infarcts are present in the bilateral thalami, right genu of corpus callosum, right hemi pons, right anterior and posterior corona radiata and the left parietal cortex. Stable confluent white matter hyperintensities in subcortical and periventricular white matter, throughout basal ganglia, and pons compatible with advanced chronic microvascular ischemic changes. Punctate focus of susceptibility hypointensity within the right parietal lobe compatible hemosiderin deposition of chronic microhemorrhage. No extra-axial collection, hydrocephalus, mass effect, or herniation. Vascular: Normal flow voids. Skull and upper cervical spine: Normal marrow signal. Sinuses/Orbits: Negative. Other: None. IMPRESSION: 1. 7 mm late acute/early subacute infarction of left medial frontal lobe. No associated hemorrhage or mass effect. 2. Stable advanced chronic microvascular ischemic changes and multiple small chronic infarcts of the brain. These results were called by telephone at the time of interpretation on 02/17/2018 at 8:42 pm to Dr. Harvest Dark , who verbally acknowledged these results. Electronically Signed   By: Kristine Garbe M.D.   On: 02/17/2018 20:44   US Carotid Bilateral (at [facility] Only)  Result Date: 02/18/2018 CLINICAL DATA:  Small acute left frontal infarct EXAM: BILATERAL CAROTID DUPLEX ULTRASOUND TECHNIQUE: Pearline Cables scale imaging, color Doppler and duplex ultrasound were performed of bilateral carotid and vertebral arteries in the neck. COMPARISON:  02/17/2018  FINDINGS: Criteria: Quantification of carotid stenosis is based on velocity parameters that correlate the residual internal carotid diameter with NASCET-based stenosis levels, using the diameter of the distal internal carotid lumen as the denominator for stenosis measurement. The following velocity measurements were obtained: RIGHT ICA: 79/24 cm/sec CCA: 27/06 cm/sec SYSTOLIC ICA/CCA RATIO:  1.2 ECA: 123 cm/sec LEFT ICA: 90/35 cm/sec CCA: 237/62 cm/sec SYSTOLIC ICA/CCA RATIO:  0.6 ECA: 95 cm/sec RIGHT CAROTID ARTERY: Minor intimal thickening and carotid atherosclerosis. No hemodynamically significant right ICA stenosis, velocity elevation, or turbulent flow. Degree of narrowing less than 50%. RIGHT  VERTEBRAL ARTERY:  Antegrade LEFT CAROTID ARTERY: Similar minor intimal thickening and carotid atherosclerosis. No hemodynamically significant left ICA stenosis, velocity elevation, or turbulent flow. LEFT VERTEBRAL ARTERY:  Antegrade IMPRESSION: Minor carotid atherosclerosis. No hemodynamically significant ICA stenosis. Degree of narrowing less than 50% bilaterally by ultrasound criteria. Patent antegrade vertebral flow bilaterally Electronically Signed   By: Jerilynn Mages.  Shick M.D.   On: 02/18/2018 11:32      Assessment: 48 y.o. female with past medical history significant for prior stroke with left-sided weakness residual, coronary artery disease status post stent on aspirin Plavix, chronic kidney disease stage III presented to hospital complaining about 1 week history of slurred speech and right-sided weakness with MRI of the brain showing subacute left frontal stroke     Stroke Risk Factors -   Plan:  HgbA1c, fasting lipid panel  MRI brain reviewed, will order MRA head and carotid  Can not get CT Angiogram due to elevated Cr   PT consult, OT consult, Speech consult  Echocardiogram  Prophylactic therapy - continue ASA/Plavix  Allow for permissive hypertension for the first 24-48h - only treat PRN if  SBP >220 mmHg. Blood pressures can be gradually normalized to SBP<140 upon discharge  Statin Therapy - LDL goal < 70, change Crestor to Lipitor   Risk factor modification  Telemetry monitoring  Frequent neuro checks  Fall Precautions  DVT prophelaxis  NPO until swallowing evaluation has been passed (aspirin suppository if needed)     Arnaldo Natal, MD

## 2018-02-18 NOTE — Progress Notes (Signed)
[facility] [place] NAME: [name]    MR#:  [id number]  DATE OF BIRTH:  January 15, 1971  SUBJECTIVE:   She presented to the hospital due to slurred speech and right leg weakness and noted to have a acute/subacute CVA on MRI.  Slurred speech has improved since yesterday, patient has chronic weakness on the left leg from a previous stroke and still has some right leg weakness.  No nausea or vomiting does complain of a mild headache.  REVIEW OF SYSTEMS:    Review of Systems  Constitutional: Negative for chills and fever.  HENT: Negative for congestion and tinnitus.   Eyes: Negative for blurred vision and double vision.  Respiratory: Negative for cough, shortness of breath and wheezing.   Cardiovascular: Negative for chest pain, orthopnea and PND.  Gastrointestinal: Negative for abdominal pain, diarrhea, nausea and vomiting.  Genitourinary: Negative for dysuria and hematuria.  Neurological: Positive for focal weakness (Right leg) and headaches. Negative for dizziness and sensory change.  All other systems reviewed and are negative.   Nutrition: Heart Healthy/Carb control Tolerating Diet: Yes Tolerating PT: Eval noted.  DRUG ALLERGIES:   Allergies  Allergen Reactions  . Lipitor [Atorvastatin] Other (See Comments)    Leg pains  . Tramadol Other (See Comments)    Mouth feels like it's on fire    VITALS:  Blood pressure 133/78, pulse 74, temperature 98.1 F (36.7 C), temperature source Oral, resp. rate 20, height 5\' 2"  (1.575 m), weight 78.9 kg, SpO2 99 %.  PHYSICAL EXAMINATION:   Physical Exam  GENERAL:  48 y.o.-year-old patient lying in bed in NAD.  EYES: Pupils equal, round, reactive to light and accommodation. No scleral icterus. Extraocular muscles intact.  HEENT: Head atraumatic, normocephalic. Oropharynx and nasopharynx clear.  NECK:  Supple, no jugular venous distention. No thyroid enlargement, no tenderness.  LUNGS:  Normal breath sounds bilaterally, no wheezing, rales, rhonchi. No use of accessory muscles of respiration.  CARDIOVASCULAR: S1, S2 normal. No murmurs, rubs, or gallops.  ABDOMEN: Soft, nontender, nondistended. Bowel sounds present. No organomegaly or mass.  EXTREMITIES: No cyanosis, clubbing or edema b/l.    NEUROLOGIC: Cranial nerves II through XII are intact. Dense left leg weakness, RLE weakness 3/5.   PSYCHIATRIC: The patient is alert and oriented x 3.  SKIN: No obvious rash, lesion, or ulcer.    LABORATORY PANEL:   CBC Recent Labs  Lab 02/17/18 1451  WBC 8.6  HGB 11.1*  HCT 34.7*  PLT 196   ------------------------------------------------------------------------------------------------------------------  Chemistries  Recent Labs  Lab 02/17/18 1451  NA 138  K 4.2  CL 113*  CO2 18*  GLUCOSE 78  BUN 22*  CREATININE 1.81*  CALCIUM 8.5*  AST 19  ALT 17  ALKPHOS 128*  BILITOT 0.6   ------------------------------------------------------------------------------------------------------------------  Cardiac Enzymes Recent Labs  Lab 02/17/18 1451  TROPONINI <0.03   ------------------------------------------------------------------------------------------------------------------  RADIOLOGY:  Dg Chest 2 View  Result Date: 02/18/2018 CLINICAL DATA:  Stroke EXAM: CHEST - 2 VIEW COMPARISON:  12/23/2017 FINDINGS: Lungs are clear.  No pleural effusion or pneumothorax. The heart is normal in size. Surgical clips along the left superior mediastinum. Mild degenerative changes of the visualized thoracolumbar spine. IMPRESSION: Normal chest radiographs. Electronically Signed   By: Julian Hy M.D.   On: 02/18/2018 00:05   Ct Head Wo Contrast  Result Date: 02/17/2018 CLINICAL DATA:  Dragging LEFT leg for a couple days, fatigue for 1 week, change in speech  habits. History of coronary artery disease post MI, diabetes, hypertension. EXAM: CT HEAD WITHOUT CONTRAST TECHNIQUE:  Contiguous axial images were obtained from the base of the skull through the vertex without intravenous contrast. Sagittal and coronal MPR images reconstructed from axial data set. COMPARISON:  12/23/2017 FINDINGS: Brain: Generalized atrophy. Normal ventricular morphology. No midline shift or mass effect. Small vessel chronic ischemic changes of deep cerebral white matter. Old lacunar infarcts at genu of RIGHT internal capsule and LEFT thalamus. No intracranial hemorrhage, mass lesion, evidence of acute infarction, or extra-axial fluid collection. Vascular: Atherosclerotic calcifications of internal carotid and vertebral arteries at skull base. No definite hyperdense vessels. Skull: Intact Sinuses/Orbits: Clear Other: N/A IMPRESSION: Atrophy with small vessel chronic ischemic changes of deep cerebral white matter. Old lacunar infarcts LEFT thalamus and genu of RIGHT internal capsule. No acute intracranial abnormalities. Electronically Signed   By: Lavonia Dana M.D.   On: 02/17/2018 15:24   Mr Jodene Nam Head Wo Contrast  Result Date: 02/18/2018 CLINICAL DATA:  48 y/o F; history of stroke with left-sided weakness. EXAM: MRA HEAD WITHOUT CONTRAST TECHNIQUE: Angiographic images of the Circle of Willis were obtained using MRA technique without intravenous contrast. COMPARISON:  04/09/2017 MRA head FINDINGS: Internal carotid arteries: Patent. Mild non stenotic irregularity of carotid siphons compatible with atherosclerosis. Anterior cerebral arteries: Patent. Large right A1 and anterior communicating artery with hypoplastic left A1, normal variant. Middle cerebral arteries: Patent. Left ICA with small in caliber in comparison with the right due to decreased flow given anatomic variation. Anterior communicating artery: Patent. Posterior communicating arteries: Fetal right PCA. Probable diminutive left posterior communicating artery. Posterior cerebral arteries: Tandem segments of moderate to severe left P2 stenosis.  Downstream left PCA and the right PCA are otherwise widely patent. Basilar artery:  Patent. Vertebral arteries:  Patent.  Right dominant. No additional evidence of high-grade stenosis, large vessel occlusion, or aneurysm. IMPRESSION: 1. Stable MRA of the head.  No large vessel occlusion or aneurysm. 2. Stable tandem segments of moderate to severe left P2 stenosis. Electronically Signed   By: Kristine Garbe M.D.   On: 02/18/2018 14:45   Mr Brain Wo Contrast  Result Date: 02/17/2018 CLINICAL DATA:  48 y/o F; slurred speech intermittently and confusion. Dragging of the left leg. EXAM: MRI HEAD WITHOUT CONTRAST TECHNIQUE: Multiplanar, multiecho pulse sequences of the brain and surrounding structures were obtained without intravenous contrast. COMPARISON:  02/17/2018 CT head.  04/10/2017 MRI head. FINDINGS: Brain: 7 mm focus of mildly reduced diffusion within the left medial frontal white matter with T2 FLAIR hyperintense signal abnormality (series 2, image 38) compatible with late acute to early subacute infarction. No hemorrhage or mass effect. Small chronic infarcts are present in the bilateral thalami, right genu of corpus callosum, right hemi pons, right anterior and posterior corona radiata and the left parietal cortex. Stable confluent white matter hyperintensities in subcortical and periventricular white matter, throughout basal ganglia, and pons compatible with advanced chronic microvascular ischemic changes. Punctate focus of susceptibility hypointensity within the right parietal lobe compatible hemosiderin deposition of chronic microhemorrhage. No extra-axial collection, hydrocephalus, mass effect, or herniation. Vascular: Normal flow voids. Skull and upper cervical spine: Normal marrow signal. Sinuses/Orbits: Negative. Other: None. IMPRESSION: 1. 7 mm late acute/early subacute infarction of left medial frontal lobe. No associated hemorrhage or mass effect. 2. Stable advanced chronic  microvascular ischemic changes and multiple small chronic infarcts of the brain. These results were called by telephone at the time of interpretation on 02/17/2018 at 8:42 pm  to Dr. Harvest Dark , who verbally acknowledged these results. Electronically Signed   By: Kristine Garbe M.D.   On: 02/17/2018 20:44   US Carotid Bilateral (at [facility] Only)  Result Date: 02/18/2018 CLINICAL DATA:  Small acute left frontal infarct EXAM: BILATERAL CAROTID DUPLEX ULTRASOUND TECHNIQUE: Pearline Cables scale imaging, color Doppler and duplex ultrasound were performed of bilateral carotid and vertebral arteries in the neck. COMPARISON:  02/17/2018 FINDINGS: Criteria: Quantification of carotid stenosis is based on velocity parameters that correlate the residual internal carotid diameter with NASCET-based stenosis levels, using the diameter of the distal internal carotid lumen as the denominator for stenosis measurement. The following velocity measurements were obtained: RIGHT ICA: 79/24 cm/sec CCA: 16/10 cm/sec SYSTOLIC ICA/CCA RATIO:  1.2 ECA: 123 cm/sec LEFT ICA: 90/35 cm/sec CCA: 960/45 cm/sec SYSTOLIC ICA/CCA RATIO:  0.6 ECA: 95 cm/sec RIGHT CAROTID ARTERY: Minor intimal thickening and carotid atherosclerosis. No hemodynamically significant right ICA stenosis, velocity elevation, or turbulent flow. Degree of narrowing less than 50%. RIGHT VERTEBRAL ARTERY:  Antegrade LEFT CAROTID ARTERY: Similar minor intimal thickening and carotid atherosclerosis. No hemodynamically significant left ICA stenosis, velocity elevation, or turbulent flow. LEFT VERTEBRAL ARTERY:  Antegrade IMPRESSION: Minor carotid atherosclerosis. No hemodynamically significant ICA stenosis. Degree of narrowing less than 50% bilaterally by ultrasound criteria. Patent antegrade vertebral flow bilaterally Electronically Signed   By: Jerilynn Mages.  Shick M.D.   On: 02/18/2018 11:32     ASSESSMENT AND PLAN:   48 year old female with past medical history of  previous CVA, hypertension, hyperlipidemia, hypothyroidism, CKD stage III, history of bipolar disorder, diabetes who presents to the hospital due to slurred speech and right leg weakness.  1.  Acute CVA-this is a cause of patient's slurred speech and right leg weakness.  Patient has a history of previous CVA and is on dual antiplatelet therapy but presents with acute neurologic symptoms and her MRI is positive for a left frontal lobe CVA. -Continue aspirin, Plavix for now continue atorvastatin. -Await neurology input to see if we need to change antiplatelet therapy.  This is small vessel CVA.  Carotid duplex is negative for hemodynamically significant carotid stenosis.  MRA of the head is negative for intracranial stenosis. -Await PT/OT evaluation and further input from neurology.  2.  Essential hypertension-continue Norvasc, Toprol Imdur.  3.  Hypothyroidism-continue Synthroid.  4.  Diabetes type 2 without complication-continue sliding scale insulin.  5.  GERD-continue Protonix.  6.  Anxiety/depression-continue Xanax as needed, Wellbutrin/Celexa.   All the records are reviewed and case discussed with Care Management/Social Worker. Management plans discussed with the patient, family and they are in agreement.  CODE STATUS: Full code  DVT Prophylaxis: Lovenox  TOTAL TIME TAKING CARE OF THIS PATIENT: 30 minutes.   POSSIBLE D/C IN 1-2 DAYS, DEPENDING ON CLINICAL CONDITION.   Henreitta Leber M.D on 02/18/2018 at 4:19 PM  Between 7am to 6pm - Pager - [phone]  After 6pm go to [email or web address] - Proofreader  [facility]  Office  [phone]  CC: Primary care physician; [name], MD

## 2018-02-18 NOTE — Care Management Note (Signed)
Case Management Note  Patient Details  Name: Krysten Veronica MRN: [id number] Date of Birth: 06/04/1970   Subjective/Objective:                 patient admitted from home with CVA.Marland Kitchen  She has  had previous stroke with residual left side weakness. Physical therapy recommended home health.  Occupational evaluation pending.  CM made two attempts today to speak with patient and she was not available.   Action/Plan:  Meet with patient to discuss home health  Expected Discharge Date:                  Expected Discharge Plan:     In-House Referral:     Discharge planning Services     Post Acute Care Choice:    Choice offered to:     DME Arranged:    DME Agency:     HH Arranged:    HH Agency:     Status of Service:     If discussed at [name], dates discussed:    Additional Comments:  Katrina Stack, RN 02/18/2018, 4:57 PM

## 2018-02-18 NOTE — Progress Notes (Signed)
Inpatient Diabetes Program Recommendations  AACE/ADA: New Consensus Statement on Inpatient Glycemic Control  Target Ranges:  Prepandial:   less than 140 mg/dL      Peak postprandial:   less than 180 mg/dL (1-2 hours)      Critically ill patients:  140 - 180 mg/dL  Results for Kendra DEMARI, [name] (MRN [id number]) as of 02/18/2018 10:16  Ref. Range 02/17/2018 23:42 02/18/2018 03:54 02/18/2018 04:24 02/18/2018 04:40 02/18/2018 04:56 02/18/2018 05:11 02/18/2018 08:05  Glucose-Capillary Latest Ref Range: 70 - 99 mg/dL 307 (H)  Novolog 4 units 30 (LL) 33 (LL) 58 (L) 61 (L) 100 (H) 64 (L)   Results for Kendra NYJAH, [name] (MRN [id number]) as of 02/18/2018 10:16  Ref. Range 12/19/2017 17:27 02/18/2018 03:13  Hemoglobin A1C Latest Ref Range: 4.8 - 5.6 % 5.7 (H) 5.8 (H)   Review of Glycemic Control  Diabetes history: DM2 Outpatient Diabetes medications: Actos 15 mg daily Current orders for Inpatient glycemic control: Actos 15 mg daily  Inpatient Diabetes Program Recommendations:   Correction (SSI): While inpatient please order CBGs with Novolog 0-9 units TID with meals and Novolog 0-5 units QHS.  Oral DM medications: While inpatient, please discontinue Actos.  HgbA1C: A1C 5.8% on 02/17/18 indicating an average glucose of 120 mg/dl over the past 2-3 months.  Thanks, Barnie Alderman, RN, MSN, CDE Diabetes Coordinator Inpatient Diabetes Program [phone] (Team Pager from 8am to 5pm)

## 2018-02-18 NOTE — Progress Notes (Signed)
*  PRELIMINARY RESULTS* Echocardiogram 2D Echocardiogram has been performed.  Wallie Char Hussam Muniz 02/18/2018, 5:05 PM

## 2018-02-19 ENCOUNTER — Inpatient Hospital Stay: Payer: Medicare Other

## 2018-02-19 LAB — GLUCOSE, CAPILLARY
GLUCOSE-CAPILLARY: 152 mg/dL — AB (ref 70–99)
Glucose-Capillary: 55 mg/dL — ABNORMAL LOW (ref 70–99)
Glucose-Capillary: 160 mg/dL — ABNORMAL HIGH (ref 70–99)
Glucose-Capillary: 125 mg/dL — ABNORMAL HIGH (ref 70–99)
Glucose-Capillary: 54 mg/dL — ABNORMAL LOW (ref 70–99)

## 2018-02-19 LAB — HIV ANTIBODY (ROUTINE TESTING W REFLEX): HIV SCREEN 4TH GENERATION: NONREACTIVE

## 2018-02-19 NOTE — Plan of Care (Signed)
  Problem: Education: Goal: Knowledge of disease or condition will improve Outcome: Progressing Goal: Knowledge of secondary prevention will improve Outcome: Progressing Goal: Knowledge of patient specific risk factors addressed and post discharge goals established will improve Outcome: Progressing   Problem: Coping: Goal: Will verbalize positive feelings about self Outcome: Progressing Goal: Will identify appropriate support needs Outcome: Progressing   Problem: Ischemic Stroke/TIA Tissue Perfusion: Goal: Complications of ischemic stroke/TIA will be minimized Outcome: Progressing

## 2018-02-19 NOTE — Care Management Note (Signed)
Case Management Note  Patient Details  Name: [name] MRN: [id number] Date of Birth: 1970-08-15  Subjective/Objective: Patient to be discharged per MD order. Orders in place for home health services. CMS Medicare.gov Compare Post Acute Care list reviewed with patient and since she used [facility] about a year ago she wishes to use them again. Referral placed with Jermaine who accepts referral. No DME needs. Son to transport.                    Action/Plan:   Expected Discharge Date:  02/19/18               Expected Discharge Plan:  [place]  In-House Referral:     Discharge planning Services  CM Consult  Post Acute Care Choice:  Home Health Choice offered to:  Patient  DME Arranged:    DME Agency:     HH Arranged:  PT [place]:  [place]  Status of Service:  Completed, signed off  If discussed at [place] of Stay Meetings, dates discussed:    Additional Comments:  Latanya Maudlin, RN 02/19/2018, 11:27 AM

## 2018-02-19 NOTE — Progress Notes (Signed)
Physical Therapy Treatment Patient Details Name: [name] MRN: [id number] DOB: [birth year] Today's Date: 02/19/2018    History of Present Illness  48 y.o. female  with ~1 week of slurred speech and L foot dragging during ambulation.  Imaging reveals sub-acute CVA.  Pt has some residual L sided weakness from previous CVA, feeling only minimally different from baseline.  History includes: anemia, aortic arch aneurysm, bipolar disorder, coronary artery disease and multiple catheterizations and stent placement, chronic kidney disease stage III, colon polyp, cerebrovascular accident with left-sided weakness, diabetes, familial breast cancer, hypertension, hyperlipidemia, hypothyroidism.    PT Comments    Pt ready for session.  Stood and ambulated around unit and up/down steps with SPC and min guard.  Slow guard gait but no LOB's.  She does do much better on stairs with railing and was encouraged to use back steps with rail access.  Stated son will provide assist for safety.  Pt hopes to be discharged home today.   Follow Up Recommendations  Home health PT     Equipment Recommendations       Recommendations for Other Services       Precautions / Restrictions Precautions Precautions: Fall Restrictions Weight Bearing Restrictions: No    Mobility  Bed Mobility Overal bed mobility: Independent                Transfers Overall transfer level: Modified independent                  Ambulation/Gait Ambulation/Gait assistance: Min guard;Supervision   Assistive device: Straight cane Gait Pattern/deviations: Step-through pattern;Decreased step length - right;Decreased step length - left Gait velocity: decreased   General Gait Details: slow.  some unsteadiness noted but pt stated she felt better with [facility] and will use hers upon discharge.   Stairs   Stairs assistance: Min assist Stair Management: One rail Right Number of Stairs: 4 General stair comments: Pt does  much better with rail.  Encouraged to use back steps which has a railing vs front that does not.    Wheelchair Mobility    Modified Rankin (Stroke Patients Only)       Balance Overall balance assessment: Modified Independent                                          Cognition Arousal/Alertness: Awake/alert Behavior During Therapy: WFL for tasks assessed/performed Overall Cognitive Status: Within Functional Limits for tasks assessed                                        Exercises      General Comments        Pertinent Vitals/Pain Pain Assessment: No/denies pain    Home Living                      Prior Function            PT Goals (current goals can now be found in the care plan section) Progress towards PT goals: Progressing toward goals    Frequency    7X/week      PT Plan Current plan remains appropriate    Co-evaluation              AM-PAC PT "6 Clicks" Mobility  Outcome Measure  Help needed turning from your back to your side while in a flat bed without using bedrails?: None Help needed moving from lying on your back to sitting on the side of a flat bed without using bedrails?: None Help needed moving to and from a bed to a chair (including a wheelchair)?: None Help needed standing up from a chair using your arms (e.g., wheelchair or bedside chair)?: None Help needed to walk in hospital room?: A Little Help needed climbing 3-5 steps with a railing? : A Little 6 Click Score: 22    End of Session Equipment Utilized During Treatment: Gait belt Activity Tolerance: Patient tolerated treatment well Patient left: with call bell/phone within reach;in bed         Time: 8299-3716 PT Time Calculation (min) (ACUTE ONLY): 9 min  Charges:  $Gait Training: 8-22 mins                     Chesley Noon, PTA 02/19/18, 10:08 AM

## 2018-02-19 NOTE — Discharge Summary (Signed)
[facility] [place] NAME: [name]    MR#:  [id number]  DATE OF BIRTH:  April 10, 1970  DATE OF ADMISSION:  02/17/2018 ADMITTING PHYSICIAN: Vaughan Basta, MD  DATE OF DISCHARGE: 02/19/2018  PRIMARY CARE PHYSICIAN: [name], MD    ADMISSION DIAGNOSIS:  Cerebrovascular accident (CVA), unspecified mechanism ([place]) [I63.9]  DISCHARGE DIAGNOSIS:  Principal Problem:   Stroke [facility])   SECONDARY DIAGNOSIS:   Past Medical History:  Diagnosis Date  . Anemia    iron deficiency anemia  . Aortic arch aneurysm ([place])   . Bipolar disorder ([place])   . BRCA negative 2014  . CAD (coronary artery disease)    a. 08/2003 Cath: LAD 30-40-med Rx; b. 11/2014 PCI: LAD 25m(3.25x23 Xience Alpine DES); c. 06/2015 PCI: D1 (2.25x12 Resolute Integrity DES); d. 06/2017 PCI: Patent mLAD stent, D2 95 (PTCA); e. 09/2017 Cath/PCI: LM nl, LAD 334m70d, D1 40, D2 99ost (CBA), LCX 40p, RCA 40ost/p.  . Marland KitchenKD (chronic kidney disease), stage III ([facility]  . Colon polyp   . CVA (cerebral vascular accident) ([place]   Left side weakness.   . Diabetes ([facility]  . Family history of breast cancer    BRCA neg 2014  . Gastric ulcer 04/27/2011  . History of echocardiogram    a. 03/2017 Echo: EF 60-65%, no rwma.  . Marland KitchenTN (hypertension)   . Hyperlipemia   . Hypothyroid   . Malignant melanoma of skin of scalp ([facility]  . MI, acute, non ST segment elevation ([facility]  . Neuromuscular disorder ([facility]  . S/P drug eluting coronary stent placement 06/04/2015  . Sepsis ([facility]2/14/2019    HOSPITAL COURSE:   48 year old female with past medical history of previous CVA, hypertension, hyperlipidemia, hypothyroidism, CKD stage III, history of bipolar disorder, diabetes who presents to the hospital due to slurred speech and right leg weakness.  1.  Acute CVA-this was the cause of patient's slurred speech and right leg weakness.  Patient has a history of previous CVA and is on dual  antiplatelet therapy but presented with acute neurologic symptoms and her MRI was positive for a left frontal lobe CVA. -Seen by neurology and underwent MRA of the head and neck which showed no evidence of any intracranial large vessel stenosis or any carotid artery stenosis.  As per the patient she is intermittently compliant with her anti-platelet therapy.  Neurology recommended continuing her aspirin Plavix and atorvastatin for now and no change in therapy. -Patient was seen by physical therapy and Occupational Therapy and they recommended home health services which is being arranged for the patient prior to discharge. -Her slurred speech has significantly improved.  2.  Essential hypertension- she will continue Norvasc, Toprol, Imdur, Losartan.  3.  Hypothyroidism- she will continue Synthroid.  4.  GERD- pt. Will continue Omeprazole.   5.  Anxiety/depression-pt. Will continue Xanax as needed, Wellbutrin/Celexa.  6. DM -while patient was in the hospital she was on sliding scale insulin, she will resume her Actos upon discharge.  DISCHARGE CONDITIONS:   Stable  CONSULTS OBTAINED:    DRUG ALLERGIES:   Allergies  Allergen Reactions  . Lipitor [Atorvastatin] Other (See Comments)    Leg pains  . Tramadol Other (See Comments)    Mouth feels like it's on fire    DISCHARGE MEDICATIONS:   Allergies as of 02/19/2018      Reactions   Lipitor [atorvastatin] Other (See Comments)   Leg  pains   Tramadol Other (See Comments)   Mouth feels like it's on fire      Medication List    STOP taking these medications   hyoscyamine 0.125 MG SL tablet Commonly known as:  LEVSIN SL     TAKE these medications   ALPRAZolam 1 MG tablet Commonly known as:  XANAX TAKE 1 TABLET (1 MG TOTAL) BY MOUTH DAILY AS NEEDED FOR ANXIETY.   amLODipine 5 MG tablet Commonly known as:  NORVASC Take 1 tablet (5 mg total) by mouth daily.   aspirin 81 MG chewable tablet Chew 1 tablet (81 mg total) by  mouth daily.   buPROPion 150 MG 24 hr tablet Commonly known as:  WELLBUTRIN XL TAKE 1 TABLET BY MOUTH DAILY   citalopram 40 MG tablet Commonly known as:  CELEXA Take 1 tablet (40 mg total) by mouth daily.   clopidogrel 75 MG tablet Commonly known as:  PLAVIX Take 1 tablet (75 mg total) by mouth daily with breakfast.   gabapentin 300 MG capsule Commonly known as:  NEURONTIN TAKE 1 CAPSULE BY MOUTH TWICE A DAY   isosorbide mononitrate 120 MG 24 hr tablet Commonly known as:  IMDUR Take 1 tablet (120 mg total) by mouth daily.   levothyroxine 25 MCG tablet Commonly known as:  SYNTHROID, LEVOTHROID Take 1 tablet (25 mcg total) daily before breakfast by mouth.   losartan 25 MG tablet Commonly known as:  COZAAR Take 1/2 tablet (12.5 mg) by mouth once daily What changed:    how much to take  how to take this  when to take this  additional instructions   metoprolol succinate 50 MG 24 hr tablet Commonly known as:  TOPROL-XL Take 1 tablet (50 mg total) by mouth daily. Take with or immediately following a meal.   nitroGLYCERIN 0.4 MG SL tablet Commonly known as:  NITROSTAT Place 1 tablet (0.4 mg total) under the tongue every 5 (five) minutes as needed for chest pain.   ONE TOUCH ULTRA 2 w/Device Kit Use to check blood sugar once a day. Dx. E11.9   pantoprazole 40 MG tablet Commonly known as:  PROTONIX Take 1 tablet (40 mg total) by mouth 2 (two) times daily.   pioglitazone 15 MG tablet Commonly known as:  ACTOS Take 15 mg by mouth daily.   promethazine 25 MG tablet Commonly known as:  PHENERGAN Take 1 tablet (25 mg total) by mouth every 6 (six) hours as needed for nausea or vomiting.   pyridostigmine 60 MG tablet Commonly known as:  MESTINON Take 60 mg by mouth 3 (three) times daily.   rosuvastatin 10 MG tablet Commonly known as:  CRESTOR Take 1 tablet (10 mg total) by mouth daily.   VITAMIN B 12 PO Take 1,000 mcg by mouth daily.         DISCHARGE  INSTRUCTIONS:   DIET:  Cardiac diet and Diabetic diet  DISCHARGE CONDITION:  Stable  ACTIVITY:  Activity as tolerated  OXYGEN:  Home Oxygen: No.   Oxygen Delivery: room air  DISCHARGE LOCATION:  Home with Home Health PT   If you experience worsening of your admission symptoms, develop shortness of breath, life threatening emergency, suicidal or homicidal thoughts you must seek medical attention immediately by calling 911 or calling your MD immediately  if symptoms less severe.  You Must read complete instructions/literature along with all the possible adverse reactions/side effects for all the Medicines you take and that have been prescribed to you. Take any new  Medicines after you have completely understood and accpet all the possible adverse reactions/side effects.   Please note  You were cared for by a hospitalist during your hospital stay. If you have any questions about your discharge medications or the care you received while you were in the hospital after you are discharged, you can call the unit and asked to speak with the hospitalist on call if the hospitalist that took care of you is not available. Once you are discharged, your primary care physician will handle any further medical issues. Please note that NO REFILLS for any discharge medications will be authorized once you are discharged, as it is imperative that you return to your primary care physician (or establish a relationship with a primary care physician if you do not have one) for your aftercare needs so that they can reassess your need for medications and monitor your lab values.     Today   Pt. Slurred speech has improved.  Still has some right leg weakness but has improved. No headache, N/V.    VITAL SIGNS:  Blood pressure 131/80, pulse 62, temperature 98.1 F (36.7 C), temperature source Oral, resp. rate 16, height 5' 2" (1.575 m), weight 78.9 kg, SpO2 99 %.  I/O:  No intake or output data in the 24  hours ending 02/19/18 1309  PHYSICAL EXAMINATION:   GENERAL:  48 y.o.-year-old patient lying in bed in NAD.  EYES: Pupils equal, round, reactive to light and accommodation. No scleral icterus. Extraocular muscles intact.  HEENT: Head atraumatic, normocephalic. Oropharynx and nasopharynx clear.  NECK:  Supple, no jugular venous distention. No thyroid enlargement, no tenderness.  LUNGS: Normal breath sounds bilaterally, no wheezing, rales, rhonchi. No use of accessory muscles of respiration.  CARDIOVASCULAR: S1, S2 normal. No murmurs, rubs, or gallops.  ABDOMEN: Soft, nontender, nondistended. Bowel sounds present. No organomegaly or mass.  EXTREMITIES: No cyanosis, clubbing or edema b/l.    NEUROLOGIC: Cranial nerves II through XII are intact. left leg weakness improved, RLE weakness improved 4/5 strength.  PSYCHIATRIC: The patient is alert and oriented x 3.  SKIN: No obvious rash, lesion, or ulcer.   DATA REVIEW:   CBC Recent Labs  Lab 02/17/18 1451  WBC 8.6  HGB 11.1*  HCT 34.7*  PLT 196    Chemistries  Recent Labs  Lab 02/17/18 1451  NA 138  K 4.2  CL 113*  CO2 18*  GLUCOSE 78  BUN 22*  CREATININE 1.81*  CALCIUM 8.5*  AST 19  ALT 17  ALKPHOS 128*  BILITOT 0.6    Cardiac Enzymes Recent Labs  Lab 02/17/18 1451  TROPONINI <0.03    RADIOLOGY:  Dg Chest 2 View  Result Date: 02/18/2018 CLINICAL DATA:  Stroke EXAM: CHEST - 2 VIEW COMPARISON:  12/23/2017 FINDINGS: Lungs are clear.  No pleural effusion or pneumothorax. The heart is normal in size. Surgical clips along the left superior mediastinum. Mild degenerative changes of the visualized thoracolumbar spine. IMPRESSION: Normal chest radiographs. Electronically Signed   By: Julian Hy M.D.   On: 02/18/2018 00:05   Ct Head Wo Contrast  Result Date: 02/17/2018 CLINICAL DATA:  Dragging LEFT leg for a couple days, fatigue for 1 week, change in speech habits. History of coronary artery disease post MI,  diabetes, hypertension. EXAM: CT HEAD WITHOUT CONTRAST TECHNIQUE: Contiguous axial images were obtained from the base of the skull through the vertex without intravenous contrast. Sagittal and coronal MPR images reconstructed from axial data set. COMPARISON:  12/23/2017  FINDINGS: Brain: Generalized atrophy. Normal ventricular morphology. No midline shift or mass effect. Small vessel chronic ischemic changes of deep cerebral white matter. Old lacunar infarcts at genu of RIGHT internal capsule and LEFT thalamus. No intracranial hemorrhage, mass lesion, evidence of acute infarction, or extra-axial fluid collection. Vascular: Atherosclerotic calcifications of internal carotid and vertebral arteries at skull base. No definite hyperdense vessels. Skull: Intact Sinuses/Orbits: Clear Other: N/A IMPRESSION: Atrophy with small vessel chronic ischemic changes of deep cerebral white matter. Old lacunar infarcts LEFT thalamus and genu of RIGHT internal capsule. No acute intracranial abnormalities. Electronically Signed   By: Lavonia Dana M.D.   On: 02/17/2018 15:24   Mr Jodene Nam Head Wo Contrast  Result Date: 02/18/2018 CLINICAL DATA:  48 y/o F; history of stroke with left-sided weakness. EXAM: MRA HEAD WITHOUT CONTRAST TECHNIQUE: Angiographic images of the Circle of Willis were obtained using MRA technique without intravenous contrast. COMPARISON:  04/09/2017 MRA head FINDINGS: Internal carotid arteries: Patent. Mild non stenotic irregularity of carotid siphons compatible with atherosclerosis. Anterior cerebral arteries: Patent. Large right A1 and anterior communicating artery with hypoplastic left A1, normal variant. Middle cerebral arteries: Patent. Left ICA with small in caliber in comparison with the right due to decreased flow given anatomic variation. Anterior communicating artery: Patent. Posterior communicating arteries: Fetal right PCA. Probable diminutive left posterior communicating artery. Posterior cerebral arteries:  Tandem segments of moderate to severe left P2 stenosis. Downstream left PCA and the right PCA are otherwise widely patent. Basilar artery:  Patent. Vertebral arteries:  Patent.  Right dominant. No additional evidence of high-grade stenosis, large vessel occlusion, or aneurysm. IMPRESSION: 1. Stable MRA of the head.  No large vessel occlusion or aneurysm. 2. Stable tandem segments of moderate to severe left P2 stenosis. Electronically Signed   By: Kristine Garbe M.D.   On: 02/18/2018 14:45   Mr Jodene Nam Neck Wo Contrast  Result Date: 02/19/2018 CLINICAL DATA:  Stroke, slurred speech EXAM: MRA NECK WITHOUT  CONTRAST TECHNIQUE: Multiplanar and multiecho pulse sequences of the neck were obtained without intravenous contrast. Angiographic images of the neck were obtained using MRA technique without and with intravenous contrast. COMPARISON:  MRI head 02/17/2018.  MRA head 02/18/2018 FINDINGS: Small thready left vertebral artery which appears diffusely diseased but patent. Dominant right vertebral artery widely patent. Antegrade flow in both vertebral arteries. Right carotid widely patent.  No significant stenosis Left carotid bifurcation not visualized and may be low or possibly separate origins. Left internal and external carotid artery are patent through the segments evaluated. IMPRESSION: Thready left vertebral artery which is diffusely disease. Dominant right vertebral artery widely patent No significant carotid stenosis. Left carotid bifurcation not visualized and may be low or have separate origins. Electronically Signed   By: Franchot Gallo M.D.   On: 02/19/2018 07:14   Mr Brain Wo Contrast  Result Date: 02/17/2018 CLINICAL DATA:  48 y/o F; slurred speech intermittently and confusion. Dragging of the left leg. EXAM: MRI HEAD WITHOUT CONTRAST TECHNIQUE: Multiplanar, multiecho pulse sequences of the brain and surrounding structures were obtained without intravenous contrast. COMPARISON:  02/17/2018 CT  head.  04/10/2017 MRI head. FINDINGS: Brain: 7 mm focus of mildly reduced diffusion within the left medial frontal white matter with T2 FLAIR hyperintense signal abnormality (series 2, image 38) compatible with late acute to early subacute infarction. No hemorrhage or mass effect. Small chronic infarcts are present in the bilateral thalami, right genu of corpus callosum, right hemi pons, right anterior and posterior corona radiata and  the left parietal cortex. Stable confluent white matter hyperintensities in subcortical and periventricular white matter, throughout basal ganglia, and pons compatible with advanced chronic microvascular ischemic changes. Punctate focus of susceptibility hypointensity within the right parietal lobe compatible hemosiderin deposition of chronic microhemorrhage. No extra-axial collection, hydrocephalus, mass effect, or herniation. Vascular: Normal flow voids. Skull and upper cervical spine: Normal marrow signal. Sinuses/Orbits: Negative. Other: None. IMPRESSION: 1. 7 mm late acute/early subacute infarction of left medial frontal lobe. No associated hemorrhage or mass effect. 2. Stable advanced chronic microvascular ischemic changes and multiple small chronic infarcts of the brain. These results were called by telephone at the time of interpretation on 02/17/2018 at 8:42 pm to Dr. Harvest Dark , who verbally acknowledged these results. Electronically Signed   By: Kristine Garbe M.D.   On: 02/17/2018 20:44   US Carotid Bilateral (at [facility] Only)  Result Date: 02/18/2018 CLINICAL DATA:  Small acute left frontal infarct EXAM: BILATERAL CAROTID DUPLEX ULTRASOUND TECHNIQUE: Pearline Cables scale imaging, color Doppler and duplex ultrasound were performed of bilateral carotid and vertebral arteries in the neck. COMPARISON:  02/17/2018 FINDINGS: Criteria: Quantification of carotid stenosis is based on velocity parameters that correlate the residual internal carotid diameter with  NASCET-based stenosis levels, using the diameter of the distal internal carotid lumen as the denominator for stenosis measurement. The following velocity measurements were obtained: RIGHT ICA: 79/24 cm/sec CCA: 50/27 cm/sec SYSTOLIC ICA/CCA RATIO:  1.2 ECA: 123 cm/sec LEFT ICA: 90/35 cm/sec CCA: 741/28 cm/sec SYSTOLIC ICA/CCA RATIO:  0.6 ECA: 95 cm/sec RIGHT CAROTID ARTERY: Minor intimal thickening and carotid atherosclerosis. No hemodynamically significant right ICA stenosis, velocity elevation, or turbulent flow. Degree of narrowing less than 50%. RIGHT VERTEBRAL ARTERY:  Antegrade LEFT CAROTID ARTERY: Similar minor intimal thickening and carotid atherosclerosis. No hemodynamically significant left ICA stenosis, velocity elevation, or turbulent flow. LEFT VERTEBRAL ARTERY:  Antegrade IMPRESSION: Minor carotid atherosclerosis. No hemodynamically significant ICA stenosis. Degree of narrowing less than 50% bilaterally by ultrasound criteria. Patent antegrade vertebral flow bilaterally Electronically Signed   By: Jerilynn Mages.  Shick M.D.   On: 02/18/2018 11:32      Management plans discussed with the patient, family and they are in agreement.  CODE STATUS:     Code Status Orders  (From admission, onward)         Start     Ordered   02/17/18 2323  Full code  Continuous     02/17/18 2322          TOTAL TIME TAKING CARE OF THIS PATIENT: 40 minutes.    Henreitta Leber M.D on 02/19/2018 at 1:09 PM  Between 7am to 6pm - Pager - [phone]  After 6pm go to [email or web address] - Proofreader  [facility]  Office  [phone]  CC: Primary care physician; [name], MD

## 2018-02-19 NOTE — Progress Notes (Signed)
Pt D/C to home with son. IV removed intact. VSS. Education completed. All questions answered. Belongings sent with patient.

## 2018-02-19 NOTE — Evaluation (Signed)
Occupational Therapy Evaluation Patient Details Name: [name] MRN: [id number] DOB: [birth year] Today's Date: 02/19/2018    History of Present Illness  48 y.o. female  with ~1 week of slurred speech and L foot dragging during ambulation.  Imaging reveals sub-acute CVA.  Pt has some residual L sided weakness from previous CVA, feeling only minimally different from baseline.  History includes: anemia, aortic arch aneurysm, bipolar disorder, coronary artery disease and multiple catheterizations and stent placement, chronic kidney disease stage III, colon polyp, cerebrovascular accident with left-sided weakness, diabetes, familial breast cancer, hypertension, hyperlipidemia, hypothyroidism.   Clinical Impression   Pt seen for OT evaluation this date. Pt and son present and agreeable to OT session. Pt lives in a 1 story home with 5 steps to enter with R handrails, tub-shower with  adult son in the home and able to provide PRN assist as needed upon pt's return home. Pt was modified independent with mobility, ADL, and IADL prior to admission. She reports her son will provider her with assistance walking in the community and that she does not use AE for mobility. Son occasionally assists with ADLs. Pt endorses at least 3 falls in the past 6 months. Pt at supervision level for mobility during assessment; strength and ROM WFL.  Baseline independence with ADL tasks. Pt would benefit from skilled OT to address noted impairments and functional limitations (see below for any additional details) in order to maximize safety and independence while minimizing falls risk and caregiver burden.  Upon hospital discharge, recommend [facility].      Follow Up Recommendations  Home health OT    Equipment Recommendations       Recommendations for Other Services       Precautions / Restrictions Precautions Precautions: Fall Restrictions Weight Bearing Restrictions: No      Mobility Bed Mobility Overal bed  mobility: Independent             General bed mobility comments: Pt able to get herself to sitting at EOB w/o assist, w/o issue  Transfers Overall transfer level: Independent Equipment used: None             General transfer comment: Pt was able to rise and maintain balance w/o UEs supported. Pt ambulated around end of bed using one hand on bed railing without LOB or additional adaptive device on this date. Supervision for safety.     Balance Overall balance assessment: Modified Independent                                         ADL either performed or assessed with clinical judgement   ADL Overall ADL's : At baseline                                       General ADL Comments: Pt endorses feeling back to baseline function. Reports ambulating with PT and around her room with a family member. Overall UE strength and mobility WFL for ADL tasks. Could benefit from additional falls prevention education to ensure safety with ADLs upon DC.      Vision Baseline Vision/History: Wears glasses Wears Glasses: At all times Patient Visual Report: No change from baseline Vision Assessment?: No apparent visual deficits;Yes Eye Alignment: Within Functional Limits Ocular Range of Motion: Within Functional Limits Alignment/Gaze Preference:  Within Defined Limits Tracking/Visual Pursuits: Able to track stimulus in all quads without difficulty Saccades: Within functional limits Convergence: Within functional limits Visual Fields: No apparent deficits     Perception     Praxis      Pertinent Vitals/Pain Pain Assessment: No/denies pain     Hand Dominance Right   Extremity/Trunk Assessment Upper Extremity Assessment Upper Extremity Assessment: Overall WFL for tasks assessed;LUE deficits/detail LUE Deficits / Details: Pt with decreased LUE coordination and strength on this date (grossly 4-/5), however WFL for this pt.  LUE Sensation: WNL LUE  Coordination: decreased fine motor(Slight decrease in accuracy with thumb oppostition. Pt reports no functional deficits/concerns. )   Lower Extremity Assessment Lower Extremity Assessment: Defer to PT evaluation;Overall [facility] for tasks assessed   Cervical / Trunk Assessment Cervical / Trunk Assessment: Normal   Communication Communication Communication: No difficulties   Cognition Arousal/Alertness: Awake/alert Behavior During Therapy: WFL for tasks assessed/performed Overall Cognitive Status: Within Functional Limits for tasks assessed                                     General Comments  Pt overall function has returned to baseline. However at baseline was experiencing repeated falls within the home. Pt and son endorse at least 3 falls in last 6 months where pt "trips over her own feet" or is put off balance by a pet. Son reports he has been able to "catch" pt so no significant injury has occurred. Pt would benefit from further falls prevention and home modification instruction to decrease risk of falls and improve safety within the home.     Exercises Other Exercises Other Exercises: Pt instructed in falls prevention strategies including safe use of AE/DME and pet mgmt in order to improve functional independence and safety within the home upon DC.   Shoulder Instructions      Home Living Family/patient expects to be discharged to:: Private residence Living Arrangements: Children Available Help at Discharge: Family;Available PRN/intermittently Type of Home: House Home Access: Stairs to enter Entrance Stairs-Number of Steps: 5 Entrance Stairs-Rails: Right(No rails on 4 back steps) Home Layout: One level     Bathroom Shower/Tub: Tub/shower unit;Curtain         Home Equipment: Environmental consultant - 2 wheels;Cane - single point          Prior Functioning/Environment Level of Independence: Needs assistance  Gait / Transfers Assistance Needed: holds onto son during  mobility, doesn't use AD ADL's / Homemaking Assistance Needed: Pt endorses independence with ADL/IADL/Med mgmt on this date.             OT Problem List: Decreased strength;Decreased range of motion;Decreased activity tolerance;Impaired balance (sitting and/or standing);Decreased coordination;Decreased knowledge of use of DME or AE;Impaired sensation;Impaired UE functional use      OT Treatment/Interventions: Self-care/ADL training;Therapeutic exercise;Therapeutic activities;Neuromuscular education;Energy conservation;DME and/or AE instruction;Patient/family education    OT Goals(Current goals can be found in the care plan section) Acute Rehab OT Goals Patient Stated Goal: go home, get back to independence OT Goal Formulation: With patient Time For Goal Achievement: 03/05/18 Potential to Achieve Goals: Good ADL Goals Pt Will Perform Upper Body Dressing: with modified independence;with adaptive equipment(With LRAD for safety and functional independence.) Pt Will Perform Lower Body Dressing: with modified independence;with adaptive equipment(With LRAD for safety and functional independence.) Additional ADL Goal #1: Pt will verbalize plan to implement at least one falls prevention strategy to maximize  safety and independence in the home. Additional ADL Goal #2: Pt will independently verbalize plan for implementing at least 1 home environmental modification that will increase safety and independence in her home.  OT Frequency: Min 1X/week   Barriers to D/C: Decreased caregiver support          Co-evaluation              AM-PAC OT "6 Clicks" Daily Activity     Outcome Measure Help from another person eating meals?: None Help from another person taking care of personal grooming?: A Little Help from another person toileting, which includes using toliet, bedpan, or urinal?: A Little Help from another person bathing (including washing, rinsing, drying)?: A Little Help from another  person to put on and taking off regular upper body clothing?: A Little Help from another person to put on and taking off regular lower body clothing?: A Little 6 Click Score: 19   End of Session    Activity Tolerance: Patient tolerated treatment well Patient left: in bed;with call bell/phone within reach;with family/visitor present;with bed alarm set  OT Visit Diagnosis: Unsteadiness on feet (R26.81);Other abnormalities of gait and mobility (R26.89);Repeated falls (R29.6);History of falling (Z91.81);Hemiplegia and hemiparesis Hemiplegia - Right/Left: Left Hemiplegia - dominant/non-dominant: Non-Dominant Hemiplegia - caused by: Cerebral infarction                Time: 1005-1030 OT Time Calculation (min): 25 min Charges:  OT General Charges $OT Visit: 1 Visit OT Evaluation $OT Eval Low Complexity: 1 Low OT Treatments $Self Care/Home Management : 8-22 mins  Shara Blazing, M.S., OTR/L Ascom: [phone] 02/19/18, 11:43 AM

## 2018-02-20 DIAGNOSIS — E1122 Type 2 diabetes mellitus with diabetic chronic kidney disease: Secondary | ICD-10-CM | POA: Insufficient documentation

## 2018-02-21 ENCOUNTER — Telehealth: Payer: Self-pay

## 2018-02-21 NOTE — Telephone Encounter (Signed)
Transition Care Management Follow-up Telephone Call  Date of discharge and from where: [facility] on 02/20/28.  How have you been since you were released from the hospital? Doing good just tired. Was having right thigh pain that was shooting down her leg intermittently throughout yesterday. Today both legs are sore but not as bad. Not slurring anymore. Has had nausea and SOB but pt states she is always SOB due to heart. Declines fever, swelling, or v/d.  Any questions or concerns? Yes, why she keeps having strokes?  Items Reviewed:  Did the pt receive and understand the discharge instructions provided? Yes   Medications obtained and verified? No, pt declined reviewing these at this time.  Any new allergies since your discharge? No   Dietary orders reviewed? Yes  Do you have support at home? Yes   Other (ie: DME, Home Health, etc) Yes, [facility] for PT.  Functional Questionnaire: (I = Independent and D = Dependent)  Bathing/Dressing- I   Meal Prep- I  Eating- I  Maintaining continence- Occasional diarrhea that causes leakage.  Transferring/Ambulation- D, using a cane currently.  Managing Meds- I   Follow up appointments reviewed:    PCP Hospital f/u appt confirmed? No, please see note about scheduling AFU apt.  [facility] f/u appt confirmed? No , pt to call Dr Donivan Scull offices to set up apt.   Are transportation arrangements needed? No   If their condition worsens, is the pt aware to call  their PCP or go to the ED? Yes  Was the patient provided with contact information for the PCP's office or ED? Yes  Was the pt encouraged to call back with questions or concerns? Yes

## 2018-02-21 NOTE — Telephone Encounter (Signed)
Apt 8am on 02/24/2018  Thanks,   -Mickel Baas

## 2018-02-22 ENCOUNTER — Telehealth: Payer: Self-pay | Admitting: Family Medicine

## 2018-02-22 DIAGNOSIS — I69354 Hemiplegia and hemiparesis following cerebral infarction affecting left non-dominant side: Secondary | ICD-10-CM | POA: Diagnosis not present

## 2018-02-22 DIAGNOSIS — E1122 Type 2 diabetes mellitus with diabetic chronic kidney disease: Secondary | ICD-10-CM | POA: Diagnosis not present

## 2018-02-22 DIAGNOSIS — I129 Hypertensive chronic kidney disease with stage 1 through stage 4 chronic kidney disease, or unspecified chronic kidney disease: Secondary | ICD-10-CM | POA: Diagnosis not present

## 2018-02-22 DIAGNOSIS — I2510 Atherosclerotic heart disease of native coronary artery without angina pectoris: Secondary | ICD-10-CM | POA: Diagnosis not present

## 2018-02-22 DIAGNOSIS — Z7982 Long term (current) use of aspirin: Secondary | ICD-10-CM | POA: Diagnosis not present

## 2018-02-22 DIAGNOSIS — N183 Chronic kidney disease, stage 3 (moderate): Secondary | ICD-10-CM | POA: Diagnosis not present

## 2018-02-22 DIAGNOSIS — Z7902 Long term (current) use of antithrombotics/antiplatelets: Secondary | ICD-10-CM | POA: Diagnosis not present

## 2018-02-22 DIAGNOSIS — Z955 Presence of coronary angioplasty implant and graft: Secondary | ICD-10-CM | POA: Diagnosis not present

## 2018-02-22 NOTE — Telephone Encounter (Signed)
Shanon Rosser, PT w/ [facility] [phone] - can leave a message   Requesting Verbal approval for PT:  2 weeks for 2 weeks 1 week for 1 week  Thanks, [facility]

## 2018-02-23 DIAGNOSIS — M5416 Radiculopathy, lumbar region: Secondary | ICD-10-CM | POA: Diagnosis not present

## 2018-02-23 DIAGNOSIS — M5136 Other intervertebral disc degeneration, lumbar region: Secondary | ICD-10-CM | POA: Diagnosis not present

## 2018-02-23 NOTE — Telephone Encounter (Signed)
Erline Levine advised.   Thanks,   -Mickel Baas

## 2018-02-23 NOTE — Telephone Encounter (Signed)
That's fine

## 2018-02-24 ENCOUNTER — Encounter: Payer: Self-pay | Admitting: Family Medicine

## 2018-02-24 ENCOUNTER — Ambulatory Visit (INDEPENDENT_AMBULATORY_CARE_PROVIDER_SITE_OTHER): Payer: Medicare Other | Admitting: Family Medicine

## 2018-02-24 VITALS — BP 136/82 | HR 68 | Temp 98.3°F | Resp 16 | Wt 181.0 lb

## 2018-02-24 DIAGNOSIS — I25118 Atherosclerotic heart disease of native coronary artery with other forms of angina pectoris: Secondary | ICD-10-CM | POA: Diagnosis not present

## 2018-02-24 DIAGNOSIS — E7800 Pure hypercholesterolemia, unspecified: Secondary | ICD-10-CM | POA: Diagnosis not present

## 2018-02-24 DIAGNOSIS — N3090 Cystitis, unspecified without hematuria: Secondary | ICD-10-CM | POA: Diagnosis not present

## 2018-02-24 DIAGNOSIS — Z8673 Personal history of transient ischemic attack (TIA), and cerebral infarction without residual deficits: Secondary | ICD-10-CM | POA: Diagnosis not present

## 2018-02-24 LAB — POCT URINALYSIS DIPSTICK
Bilirubin, UA: NEGATIVE
Glucose, UA: NEGATIVE
Ketones, UA: NEGATIVE
Nitrite, UA: NEGATIVE
Protein, UA: NEGATIVE
Spec Grav, UA: 1.01 (ref 1.010–1.025)
Urobilinogen, UA: 0.2 E.U./dL
pH, UA: 6 (ref 5.0–8.0)

## 2018-02-24 MED ORDER — CIPROFLOXACIN HCL 500 MG PO TABS: 500.0000 mg | ORAL_TABLET | Freq: Two times a day (BID) | ORAL | 0 refills | Status: DC

## 2018-02-24 MED ORDER — ROSUVASTATIN CALCIUM 20 MG PO TABS: 20.0000 mg | ORAL_TABLET | Freq: Every day | ORAL | 2 refills | Status: DC

## 2018-02-24 MED ORDER — FLUCONAZOLE 150 MG PO TABS: 150.0000 mg | ORAL_TABLET | Freq: Once | ORAL | 0 refills | Status: AC

## 2018-02-24 NOTE — Patient Instructions (Signed)
.   Please bring all of your medications to every appointment so we can make sure that our medication list is the same as yours.    Increase rosuvastatin to 20mg  a day. I've sent a new prescription for 20mg  tablets to your pharmacy. You can take 2 10mg  tablets daily until they run out.    We need to work on getting dose up to 40mg  over the next few months to reduce risk of more heart attacks and strokes. If you don't tolerate the highest dose, we may need to try another medication such as Repatha.

## 2018-02-24 NOTE — Progress Notes (Signed)
Patient: [name] Female    DOB: 10-15-1970   48 y.o.   MRN: [id number] Visit Date: 02/24/2018  Today's Provider: [name], MD   Chief Complaint  Patient presents with  . Hospitalization Follow-up  . Urinary Tract Infection   Subjective:     Urinary Tract Infection   This is a new problem. Episode onset: Pt reports her symptoms started while she was in the hospital. The problem has been gradually worsening. There has been no fever. Associated symptoms include frequency, nausea and urgency. Pertinent negatives include no chills, discharge, flank pain, hematuria, hesitancy, sweats or vomiting. She has tried nothing for the symptoms.       Follow up Hospitalization  Patient was admitted to [facility] on 02/17/2018 and discharged on 02/19/2018. She was treated for Stroke. Treatment for this included No change in therapy continue Plavix Crestor and Asprin. Telephone follow up was done on 02/21/2018 She reports excellent compliance with treatment. She reports this condition is Unchanged.  She presented on day of admission with slurred speech and right sided weakness. MRI revealed acute/early subacute infarction of left medial frontal lobe. She was admitted for further workup including normal echo, no hd significant stenosid on carotids and stable MRA of the head.  No large vessel occlusion or aneurysm. Stable tandem segments of moderate to severe left P2 stenosis.  She reports medications no medication changes upon d/c she states she she was already taking 311m daily aspirin before admission. She was also on clopidogrel prior.  She reports that since discharge her speech is back to normal but she still feels weak, especially in her legs. She is doing home PT twice a week and improving. She has follow up with Dr. GRockey Situtomorrow. .Marland Kitchen------------------------------------------------------------------------------------ She had previously taken rosuvastatin 476mdaily. At some  point last year this was changed to 69m469mith addition of ezetimibe, presumably due to side effects. However patient states she could not afford ezetimibe and does not recall if she had side effects from high dose rosuvastatin. She did not tolerate atorvastatin that had been prescribed previously. Nevertheless, she states she is currently taking 20m69msuvastatin and not having any adverse effects.     Allergies  Allergen Reactions  . Lipitor [Atorvastatin] Other (See Comments)    Leg pains  . Tramadol Other (See Comments)    Mouth feels like it's on fire     Current Outpatient Medications:  .  ALPRAZolam (XANAX) 1 MG tablet, TAKE 1 TABLET (1 MG TOTAL) BY MOUTH DAILY AS NEEDED FOR ANXIETY., Disp: 30 tablet, Rfl: 5 .  amLODipine (NORVASC) 5 MG tablet, Take 1 tablet (5 mg total) by mouth daily., Disp: 90 tablet, Rfl: 6 .  aspirin 81 MG chewable tablet, Chew 1 tablet (81 mg total) by mouth daily., Disp: 30 tablet, Rfl: 11(PATIENT REPORTS TAKING 325MG DAILY) .  Blood Glucose Monitoring Suppl (ONE TOUCH ULTRA 2) w/Device KIT, Use to check blood sugar once a day. Dx. E11.9, Disp: 1 each, Rfl: 0 .  buPROPion (WELLBUTRIN XL) 150 MG 24 hr tablet, TAKE 1 TABLET BY MOUTH DAILY, Disp: 30 tablet, Rfl: 11 .  citalopram (CELEXA) 40 MG tablet, Take 1 tablet (40 mg total) by mouth daily., Disp: 30 tablet, Rfl: 5 .  clopidogrel (PLAVIX) 75 MG tablet, Take 1 tablet (75 mg total) by mouth daily with breakfast., Disp: 30 tablet, Rfl: 2 .  gabapentin (NEURONTIN) 300 MG capsule, TAKE 1 CAPSULE BY MOUTH TWICE A DAY (  Patient taking differently: Take 300 mg by mouth 2 (two) times daily. ), Disp: 60 capsule, Rfl: 5 .  isosorbide mononitrate (IMDUR) 120 MG 24 hr tablet, Take 1 tablet (120 mg total) by mouth daily., Disp: 90 tablet, Rfl: 1 .  levothyroxine (SYNTHROID, LEVOTHROID) 25 MCG tablet, Take 1 tablet (25 mcg total) daily before breakfast by mouth., Disp: 30 tablet, Rfl: 12 .  losartan (COZAAR) 25 MG tablet, Take  1/2 tablet (12.5 mg) by mouth once daily (Patient taking differently: Take 12.5 mg by mouth daily. ), Disp: 45 tablet, Rfl: 1 .  metFORMIN (GLUCOPHAGE-XR) 500 MG 24 hr tablet, Take 500 mg by mouth daily with breakfast., Disp: , Rfl:  .  metoprolol succinate (TOPROL-XL) 50 MG 24 hr tablet, Take 1 tablet (50 mg total) by mouth daily. Take with or immediately following a meal. (Patient taking differently: Take 100 mg by mouth daily. Take with or immediately following a meal.), Disp: 30 tablet, Rfl: 3 .  nitroGLYCERIN (NITROSTAT) 0.4 MG SL tablet, Place 1 tablet (0.4 mg total) under the tongue every 5 (five) minutes as needed for chest pain., Disp: 25 tablet, Rfl: 2 .  pantoprazole (PROTONIX) 40 MG tablet, Take 1 tablet (40 mg total) by mouth 2 (two) times daily., Disp: 60 tablet, Rfl: 5 .  promethazine (PHENERGAN) 25 MG tablet, Take 1 tablet (25 mg total) by mouth every 6 (six) hours as needed for nausea or vomiting., Disp: 30 tablet, Rfl: 3 .  pyridostigmine (MESTINON) 60 MG tablet, Take 60 mg by mouth 3 (three) times daily. , Disp: , Rfl:  .  rosuvastatin (CRESTOR) 10 MG tablet, Take 1 tablet (10 mg total) by mouth daily., Disp: 30 tablet, Rfl: 11 .  Cyanocobalamin (VITAMIN B 12 PO), Take 1,000 mcg by mouth daily. , Disp: , Rfl:  .  pioglitazone (ACTOS) 15 MG tablet, Take 15 mg by mouth daily., Disp: , Rfl: (WAS DISCONTINUE BY PATIENT REPORT)  Review of Systems  Constitutional: Positive for fatigue. Negative for activity change, appetite change, chills, diaphoresis, fever and unexpected weight change.  Gastrointestinal: Positive for diarrhea and nausea. Negative for abdominal distention, abdominal pain, anal bleeding, blood in stool, constipation, rectal pain and vomiting.  Genitourinary: Positive for dysuria, frequency and urgency. Negative for decreased urine volume, difficulty urinating, dyspareunia, flank pain, hematuria, hesitancy, pelvic pain, vaginal bleeding, vaginal discharge and vaginal pain.    Neurological: Negative for dizziness, light-headedness and headaches.    Social History   Tobacco Use  . Smoking status: Former Smoker    Types: Cigarettes    Last attempt to quit: 08/31/1994    Years since quitting: 23.5  . Smokeless tobacco: Never Used  . Tobacco comment: quit 28 years ago  Substance Use Topics  . Alcohol use: No    Alcohol/week: 0.0 standard drinks      Objective:   BP 136/82 (BP Location: Right Arm, Patient Position: Sitting, Cuff Size: Normal)   Pulse 68   Temp 98.3 F (36.8 C) (Oral)   Resp 16   Wt 181 lb (82.1 kg)   BMI 33.11 kg/m  Vitals:   02/24/18 0811  BP: 136/82  Pulse: 68  Resp: 16  Temp: 98.3 F (36.8 C)  TempSrc: Oral  Weight: 181 lb (82.1 kg)     Physical Exam   General Appearance:    Alert, cooperative, no distress  Eyes:    PERRL, conjunctiva/corneas clear, EOM's intact       Lungs:     Clear to auscultation  bilaterally, respirations unlabored  Heart:    Regular rate and rhythm  Neurologic:   Awake, alert, oriented x 3. Normal speech, slight right fascial droop. +5 muscle strength of UEs and LEs, slightly ataxic gait.          Assessment & Plan    1. Pure hypercholesterolemia Previously on 70m rosuvastatin but changed to low dose with ezetimibe last year presumably due to side effects. Currently off of ezetimibe due to cost. She doesn't recall side effects of rosuvastatin, so will work on titrating back up to 40.  - rosuvastatin (CRESTOR) 20 MG tablet; Take 1 tablet (20 mg total) by mouth daily.  Dispense: 30 tablet; Refill: 2  Considering history of recurrent vascular events, will need to be very aggressive with lipid management. Considering recent indication of CVA risk reduction, she would be a good candidate for Repatha If she does not tolerate high intensity statin. This was discussed with her today.   2. History of CVA (cerebrovascular accident) Slowing improving, speech is back to normal, continue home PT BP  controlled. On dual antiplatelet therapy. Work on aggressive lipid manangement as above.  - rosuvastatin (CRESTOR) 20 MG tablet; Take 1 tablet (20 mg total) by mouth daily.  Dispense: 30 tablet; Refill: 2  3. Atherosclerosis of native coronary artery of native heart with stable angina pectoris (HCC) increase- rosuvastatin (CRESTOR) to 20 MG tablet; Take 1 tablet (20 mg total) by mouth daily.  Dispense: 30 tablet; Refill:   Follow up Dr. GRockey Situthis week as scheduled.  Anticipate titrating up to 455mat follow up if tolerating. Otherwise will need second agent as above.   4. Cystitis  - POCT urinalysis dipstick - CULTURE, URINE COMPREHENSIVE - ciprofloxacin (CIPRO) 500 MG tablet; Take 1 tablet (500 mg total) by mouth 2 (two) times daily for 7 days.  Dispense: 14 tablet; Refill: 0 - fluconazole (DIFLUCAN) 150 MG tablet; Take 1 tablet (150 mg total) by mouth once for 1 dose.  Dispense: 1 tablet; Refill: 0  Follow up 6 weeks.    DoLelon HuhMD  [facility]

## 2018-02-25 DIAGNOSIS — E1122 Type 2 diabetes mellitus with diabetic chronic kidney disease: Secondary | ICD-10-CM | POA: Diagnosis not present

## 2018-02-25 DIAGNOSIS — Z955 Presence of coronary angioplasty implant and graft: Secondary | ICD-10-CM | POA: Diagnosis not present

## 2018-02-25 DIAGNOSIS — I129 Hypertensive chronic kidney disease with stage 1 through stage 4 chronic kidney disease, or unspecified chronic kidney disease: Secondary | ICD-10-CM | POA: Diagnosis not present

## 2018-02-25 DIAGNOSIS — N183 Chronic kidney disease, stage 3 (moderate): Secondary | ICD-10-CM | POA: Diagnosis not present

## 2018-02-25 DIAGNOSIS — I2510 Atherosclerotic heart disease of native coronary artery without angina pectoris: Secondary | ICD-10-CM | POA: Diagnosis not present

## 2018-02-25 DIAGNOSIS — I69354 Hemiplegia and hemiparesis following cerebral infarction affecting left non-dominant side: Secondary | ICD-10-CM | POA: Diagnosis not present

## 2018-02-28 ENCOUNTER — Telehealth: Payer: Self-pay | Admitting: Family Medicine

## 2018-02-28 DIAGNOSIS — I129 Hypertensive chronic kidney disease with stage 1 through stage 4 chronic kidney disease, or unspecified chronic kidney disease: Secondary | ICD-10-CM | POA: Diagnosis not present

## 2018-02-28 DIAGNOSIS — N183 Chronic kidney disease, stage 3 (moderate): Secondary | ICD-10-CM | POA: Diagnosis not present

## 2018-02-28 DIAGNOSIS — E1122 Type 2 diabetes mellitus with diabetic chronic kidney disease: Secondary | ICD-10-CM | POA: Diagnosis not present

## 2018-02-28 DIAGNOSIS — I2510 Atherosclerotic heart disease of native coronary artery without angina pectoris: Secondary | ICD-10-CM | POA: Diagnosis not present

## 2018-02-28 DIAGNOSIS — I69354 Hemiplegia and hemiparesis following cerebral infarction affecting left non-dominant side: Secondary | ICD-10-CM | POA: Diagnosis not present

## 2018-02-28 DIAGNOSIS — Z955 Presence of coronary angioplasty implant and graft: Secondary | ICD-10-CM | POA: Diagnosis not present

## 2018-02-28 NOTE — Telephone Encounter (Signed)
[name] called to let Dr. Caryn Section know that [name] passed out over the weekend with a blood sugar of 50.  Said she drank a lot of juice to bring it back up. Pt did not inform anyone of this.   Also patient is still having swallowing issues and not eating very much because the food is getting stuck in her throat.    Also there are drug interactions for the following: Level 2 interaction of:                        Celexa and Cipro             Celexa and Fluconazole                                   Fluconazole and Plavix   Level 3 interaction of:                                    Cipro and Levothyroxine                                    Cipro and Metformin                                    Cipro and Promethazine                                     Fluconazole and Norvasc              Fluconazole and Promethazine

## 2018-03-01 ENCOUNTER — Other Ambulatory Visit: Payer: Self-pay | Admitting: Family Medicine

## 2018-03-01 ENCOUNTER — Ambulatory Visit (INDEPENDENT_AMBULATORY_CARE_PROVIDER_SITE_OTHER): Payer: Medicare Other | Admitting: Nurse Practitioner

## 2018-03-01 ENCOUNTER — Encounter: Payer: Self-pay | Admitting: Emergency Medicine

## 2018-03-01 ENCOUNTER — Emergency Department
Admission: EM | Admit: 2018-03-01 | Discharge: 2018-03-02 | Disposition: A | Discharge status: Other Acute Inpatient Hospital | Payer: Medicare Other | Attending: Student in an Organized Health Care Education/Training Program | Admitting: Student in an Organized Health Care Education/Training Program

## 2018-03-01 ENCOUNTER — Encounter: Payer: Self-pay | Admitting: Nurse Practitioner

## 2018-03-01 VITALS — BP 130/80 | HR 67 | Ht 62.0 in | Wt 179.0 lb

## 2018-03-01 DIAGNOSIS — F05 Delirium due to known physiological condition: Secondary | ICD-10-CM | POA: Diagnosis not present

## 2018-03-01 DIAGNOSIS — R443 Hallucinations, unspecified: Secondary | ICD-10-CM

## 2018-03-01 DIAGNOSIS — Z85828 Personal history of other malignant neoplasm of skin: Secondary | ICD-10-CM | POA: Insufficient documentation

## 2018-03-01 DIAGNOSIS — F333 Major depressive disorder, recurrent, severe with psychotic symptoms: Secondary | ICD-10-CM | POA: Diagnosis not present

## 2018-03-01 DIAGNOSIS — I252 Old myocardial infarction: Secondary | ICD-10-CM | POA: Insufficient documentation

## 2018-03-01 DIAGNOSIS — Z8673 Personal history of transient ischemic attack (TIA), and cerebral infarction without residual deficits: Secondary | ICD-10-CM | POA: Insufficient documentation

## 2018-03-01 DIAGNOSIS — E1122 Type 2 diabetes mellitus with diabetic chronic kidney disease: Secondary | ICD-10-CM | POA: Diagnosis not present

## 2018-03-01 DIAGNOSIS — I130 Hypertensive heart and chronic kidney disease with heart failure and stage 1 through stage 4 chronic kidney disease, or unspecified chronic kidney disease: Secondary | ICD-10-CM | POA: Insufficient documentation

## 2018-03-01 DIAGNOSIS — E039 Hypothyroidism, unspecified: Secondary | ICD-10-CM | POA: Diagnosis not present

## 2018-03-01 DIAGNOSIS — Z9884 Bariatric surgery status: Secondary | ICD-10-CM

## 2018-03-01 DIAGNOSIS — E785 Hyperlipidemia, unspecified: Secondary | ICD-10-CM

## 2018-03-01 DIAGNOSIS — I2510 Atherosclerotic heart disease of native coronary artery without angina pectoris: Secondary | ICD-10-CM | POA: Insufficient documentation

## 2018-03-01 DIAGNOSIS — I25118 Atherosclerotic heart disease of native coronary artery with other forms of angina pectoris: Secondary | ICD-10-CM

## 2018-03-01 DIAGNOSIS — N183 Chronic kidney disease, stage 3 unspecified: Secondary | ICD-10-CM

## 2018-03-01 DIAGNOSIS — G4700 Insomnia, unspecified: Secondary | ICD-10-CM | POA: Diagnosis not present

## 2018-03-01 DIAGNOSIS — I639 Cerebral infarction, unspecified: Secondary | ICD-10-CM | POA: Diagnosis present

## 2018-03-01 DIAGNOSIS — C439 Malignant melanoma of skin, unspecified: Secondary | ICD-10-CM | POA: Diagnosis present

## 2018-03-01 DIAGNOSIS — R440 Auditory hallucinations: Secondary | ICD-10-CM | POA: Diagnosis not present

## 2018-03-01 DIAGNOSIS — Z9861 Coronary angioplasty status: Secondary | ICD-10-CM | POA: Insufficient documentation

## 2018-03-01 DIAGNOSIS — I10 Essential (primary) hypertension: Secondary | ICD-10-CM | POA: Diagnosis present

## 2018-03-01 DIAGNOSIS — G379 Demyelinating disease of central nervous system, unspecified: Secondary | ICD-10-CM

## 2018-03-01 DIAGNOSIS — N184 Chronic kidney disease, stage 4 (severe): Secondary | ICD-10-CM | POA: Insufficient documentation

## 2018-03-01 DIAGNOSIS — Z9049 Acquired absence of other specified parts of digestive tract: Secondary | ICD-10-CM | POA: Insufficient documentation

## 2018-03-01 DIAGNOSIS — R339 Retention of urine, unspecified: Secondary | ICD-10-CM | POA: Diagnosis present

## 2018-03-01 DIAGNOSIS — Z87891 Personal history of nicotine dependence: Secondary | ICD-10-CM | POA: Insufficient documentation

## 2018-03-01 DIAGNOSIS — Z7984 Long term (current) use of oral hypoglycemic drugs: Secondary | ICD-10-CM | POA: Insufficient documentation

## 2018-03-01 DIAGNOSIS — E538 Deficiency of other specified B group vitamins: Secondary | ICD-10-CM | POA: Diagnosis not present

## 2018-03-01 DIAGNOSIS — K219 Gastro-esophageal reflux disease without esophagitis: Secondary | ICD-10-CM | POA: Diagnosis present

## 2018-03-01 DIAGNOSIS — I5022 Chronic systolic (congestive) heart failure: Secondary | ICD-10-CM | POA: Diagnosis present

## 2018-03-01 DIAGNOSIS — G629 Polyneuropathy, unspecified: Secondary | ICD-10-CM

## 2018-03-01 DIAGNOSIS — Z7982 Long term (current) use of aspirin: Secondary | ICD-10-CM | POA: Insufficient documentation

## 2018-03-01 DIAGNOSIS — F319 Bipolar disorder, unspecified: Secondary | ICD-10-CM | POA: Insufficient documentation

## 2018-03-01 DIAGNOSIS — Z79899 Other long term (current) drug therapy: Secondary | ICD-10-CM | POA: Insufficient documentation

## 2018-03-01 LAB — CBC
HCT: 31.6 % — ABNORMAL LOW (ref 36.0–46.0)
Hemoglobin: 10 g/dL — ABNORMAL LOW (ref 12.0–15.0)
MCH: 26 pg (ref 26.0–34.0)
MCHC: 31.6 g/dL (ref 30.0–36.0)
MCV: 82.3 fL (ref 80.0–100.0)
Platelets: 231 10*3/uL (ref 150–400)
RBC: 3.84 MIL/uL — ABNORMAL LOW (ref 3.87–5.11)
RDW: 13.8 % (ref 11.5–15.5)
WBC: 17.2 10*3/uL — ABNORMAL HIGH (ref 4.0–10.5)
nRBC: 0 % (ref 0.0–0.2)

## 2018-03-01 LAB — URINE DRUG SCREEN, QUALITATIVE (ARMC ONLY)
Amphetamines, Ur Screen: NOT DETECTED
Barbiturates, Ur Screen: NOT DETECTED
Benzodiazepine, Ur Scrn: NOT DETECTED
Cannabinoid 50 Ng, Ur ~~LOC~~: NOT DETECTED
Cocaine Metabolite,Ur ~~LOC~~: NOT DETECTED
MDMA (Ecstasy)Ur Screen: NOT DETECTED
Methadone Scn, Ur: NOT DETECTED
Opiate, Ur Screen: NOT DETECTED
Phencyclidine (PCP) Ur S: NOT DETECTED
Tricyclic, Ur Screen: POSITIVE — AB

## 2018-03-01 LAB — COMPREHENSIVE METABOLIC PANEL
ALT: 23 U/L (ref 0–44)
AST: 25 U/L (ref 15–41)
Albumin: 3.8 g/dL (ref 3.5–5.0)
Alkaline Phosphatase: 90 U/L (ref 38–126)
Anion gap: 8 (ref 5–15)
BUN: 48 mg/dL — AB (ref 6–20)
CO2: 23 mmol/L (ref 22–32)
Calcium: 8.5 mg/dL — ABNORMAL LOW (ref 8.9–10.3)
Chloride: 105 mmol/L (ref 98–111)
Creatinine, Ser: 2.51 mg/dL — ABNORMAL HIGH (ref 0.44–1.00)
GFR calc Af Amer: 25 mL/min — ABNORMAL LOW (ref >60)
GFR calc non Af Amer: 22 mL/min — ABNORMAL LOW (ref >60)
Glucose, Bld: 71 mg/dL (ref 70–99)
Potassium: 4.8 mmol/L (ref 3.5–5.1)
Sodium: 136 mmol/L (ref 135–145)
Total Bilirubin: 0.5 mg/dL (ref 0.3–1.2)
Total Protein: 6.5 g/dL (ref 6.5–8.1)

## 2018-03-01 LAB — ETHANOL: Alcohol, Ethyl (B): <10 mg/dL (ref <10)

## 2018-03-01 LAB — SALICYLATE LEVEL: Salicylate Lvl: <7 mg/dL (ref 2.8–30.0)

## 2018-03-01 LAB — ACETAMINOPHEN LEVEL: Acetaminophen (Tylenol), Serum: <10 ug/mL — ABNORMAL LOW (ref 10–30)

## 2018-03-01 MED ORDER — OLANZAPINE 5 MG PO TABS: 5.0000 mg | ORAL_TABLET | Freq: Every day | ORAL | Status: DC

## 2018-03-01 MED ORDER — AMLODIPINE BESYLATE 5 MG PO TABS
5.0000 mg | ORAL_TABLET | Freq: Every day | ORAL | Status: DC
  Administered 2018-03-01 – 2018-03-02 (×2): 5 mg via ORAL
  Filled 2018-03-01 (×2): qty 1

## 2018-03-01 MED ORDER — LEVOTHYROXINE SODIUM 25 MCG PO TABS
25.0000 ug | ORAL_TABLET | Freq: Every day | ORAL | Status: DC
  Administered 2018-03-02: 25 ug via ORAL
  Filled 2018-03-01: qty 1

## 2018-03-01 MED ORDER — LOSARTAN POTASSIUM 25 MG PO TABS
12.5000 mg | ORAL_TABLET | Freq: Every day | ORAL | Status: DC
  Administered 2018-03-01 – 2018-03-02 (×2): 12.5 mg via ORAL
  Filled 2018-03-01 (×2): qty 0.5

## 2018-03-01 MED ORDER — GABAPENTIN 300 MG PO CAPS
300.0000 mg | ORAL_CAPSULE | Freq: Two times a day (BID) | ORAL | Status: DC
  Administered 2018-03-01 – 2018-03-02 (×2): 300 mg via ORAL
  Filled 2018-03-01 (×2): qty 1

## 2018-03-01 MED ORDER — ZOLPIDEM TARTRATE 5 MG PO TABS: 5.0000 mg | ORAL_TABLET | Freq: Every evening | ORAL | 0 refills | Status: DC | PRN

## 2018-03-01 MED ORDER — LORAZEPAM 2 MG PO TABS: 2.0000 mg | ORAL_TABLET | ORAL | Status: DC | PRN

## 2018-03-01 MED ORDER — CLOPIDOGREL BISULFATE 75 MG PO TABS
75.0000 mg | ORAL_TABLET | Freq: Every day | ORAL | Status: DC
  Administered 2018-03-02: 75 mg via ORAL
  Filled 2018-03-01: qty 1

## 2018-03-01 MED ORDER — SODIUM CHLORIDE 0.9 % IV BOLUS
1000.0000 mL | Freq: Once | INTRAVENOUS | Status: AC
  Administered 2018-03-01: 1000 mL via INTRAVENOUS

## 2018-03-01 MED ORDER — ASPIRIN 81 MG PO CHEW
325.0000 mg | CHEWABLE_TABLET | Freq: Every day | ORAL | Status: DC
  Administered 2018-03-01 – 2018-03-02 (×2): 325 mg via ORAL
  Filled 2018-03-01: qty 4
  Filled 2018-03-01: qty 5

## 2018-03-01 MED ORDER — ALPRAZOLAM 0.5 MG PO TABS
1.0000 mg | ORAL_TABLET | Freq: Every day | ORAL | Status: DC | PRN
  Administered 2018-03-01: 1 mg via ORAL
  Filled 2018-03-01: qty 2

## 2018-03-01 MED ORDER — METOPROLOL SUCCINATE ER 50 MG PO TB24
100.0000 mg | ORAL_TABLET | Freq: Every day | ORAL | Status: DC
  Administered 2018-03-01 – 2018-03-02 (×2): 100 mg via ORAL
  Filled 2018-03-01 (×2): qty 2

## 2018-03-01 MED ORDER — ROSUVASTATIN CALCIUM 40 MG PO TABS: 40.0000 mg | ORAL_TABLET | Freq: Every day | ORAL | 3 refills | Status: DC

## 2018-03-01 NOTE — ED Notes (Signed)
Hourly rounding reveals patient in room. No complaints, stable, in no acute distress. Q15 minute rounds and monitoring via Security Cameras to continue. 

## 2018-03-01 NOTE — Telephone Encounter (Signed)
Patient has been admitted.

## 2018-03-01 NOTE — ED Provider Notes (Signed)
[facility] Emergency Department Provider Note    First MD Initiated Contact with Patient 03/01/18 1856     (approximate)  I have reviewed the triage vital signs and the nursing notes.   HISTORY  Chief Complaint Mental Health Problem    HPI [name] is a 48 y.o. female the below listed past medical history presents the ER for hallucinations.  States that she is seeing cartoon characters as well as dead people and also hearing auditory hallucinations.  States that she has not slept in several days.  Denies any medication changes.  Denies any thoughts of hurting herself or others.  Brought him the right mentation of her son as the patient has been acting "crazy."    Past Medical History:  Diagnosis Date  . Anemia    iron deficiency anemia  . Aortic arch aneurysm ([place])   . Bipolar disorder ([place])   . BRCA negative 2014  . CAD (coronary artery disease)    a. 08/2003 Cath: LAD 30-40-med Rx; b. 11/2014 PCI: LAD 69m(3.25x23 Xience Alpine DES); c. 06/2015 PCI: D1 (2.25x12 Resolute Integrity DES); d. 06/2017 PCI: Patent mLAD stent, D2 95 (PTCA); e. 09/2017 PCI: D2 99ost (CBA); d. 12/2017 Cath: LM nl, LAD 368m80d (small), D1 40ost, D2 95ost, LCX 40p, RCA 40ost/p->Med rx for D2 given restenosis.  . CKD (chronic kidney disease), stage III (HCLuis Llorens Torres  . Colon polyp   . CVA (cerebral vascular accident) (HC[place]   Left side weakness.   . Diabetes (HCC[place]  . Family history of breast cancer    BRCA neg 2014  . Gastric ulcer 04/27/2011  . History of echocardiogram    a. 03/2017 Echo: EF 60-65%, no rwma; b. 02/2018 Echo: EF 60-65%, no rwma. Nl RV fxn. No cardiac source of emboli (admitted w/ stroke).  . [place] (hypertension)   . Hyperlipemia   . Hypothyroid   . Malignant melanoma of skin of scalp (HCSanbornville  . MI, acute, non ST segment elevation (HCGreenbrier  . Neuromuscular disorder (HCRepublican City  . Orthostatic hypotension   . S/P drug eluting coronary stent placement 06/04/2015  .  Sepsis (HCElm Creek2/14/2019  . St[facility] ([facility]   a. 02/2018 MRI: 14m70mate acute/early subacute L medial frontal lobe inarct; b. 02/2018 MRA No large vessel occlusion or aneurysm. Mod to sev L P2 stenosis. thready L vertebral artery, diffusely dzs'd; c. 02/2018 Carotid U/S: <50% bilat ICA dzs.   Family History  Problem Relation Age of Onset  . Hypertension Mother   . Anxiety disorder Mother   . Depression Mother   . Bipolar disorder Mother   . Heart disease Mother        No details  . Hyperlipidemia Mother   . Kidney disease Father   . Heart disease Father 40 48 Hypertension Father   . Diabetes Father   . Stroke Father   . Colon cancer Father        dx in his 40'27's Anxiety disorder Father   . Depression Father   . Skin cancer Father   . Kidney disease Sister   . Thyroid nodules Sister   . Hypertension Sister   . Hypertension Sister   . Diabetes Sister   . Hyperlipidemia Sister   . Depression Sister   . Breast cancer Maternal Aunt 50 7 Breast cancer Maternal Aunt 50 73 Ovarian cancer Cousin   . Colon cancer Cousin   .  Breast cancer Other   . Kidney cancer Neg Hx   . Bladder Cancer Neg Hx    Past Surgical History:  Procedure Laterality Date  . APPENDECTOMY    . CARDIAC CATHETERIZATION N/A 11/09/2014   Procedure: Coronary Angiography;  Surgeon: Minna Merritts, MD;  Location: [place] CV LAB;  Service: Cardiovascular;  Laterality: N/A;  . CARDIAC CATHETERIZATION N/A 11/12/2014   Procedure: Coronary Stent Intervention;  Surgeon: Isaias Cowman, MD;  Location: [place] CV LAB;  Service: Cardiovascular;  Laterality: N/A;  . CARDIAC CATHETERIZATION N/A 04/18/2015   Procedure: Left Heart Cath and Coronary Angiography;  Surgeon: Minna Merritts, MD;  Location: [place] CV LAB;  Service: Cardiovascular;  Laterality: N/A;  . CARDIAC CATHETERIZATION Left 06/04/2015   Procedure: Left Heart Cath and Coronary Angiography;  Surgeon: Wellington Hampshire, MD;  Location: [place] CV LAB;  Service: Cardiovascular;  Laterality: Left;  . CARDIAC CATHETERIZATION N/A 06/04/2015   Procedure: Coronary Stent Intervention;  Surgeon: Wellington Hampshire, MD;  Location: [place] CV LAB;  Service: Cardiovascular;  Laterality: N/A;  . CESAREAN SECTION  2001  . CHOLECYSTECTOMY N/A 11/18/2016   Procedure: LAPAROSCOPIC CHOLECYSTECTOMY WITH INTRAOPERATIVE CHOLANGIOGRAM;  Surgeon: Christene Lye, MD;  Location: [facility] ORS;  Service: General;  Laterality: N/A;  . COLONOSCOPY WITH PROPOFOL N/A 04/27/2016   Procedure: COLONOSCOPY WITH PROPOFOL;  Surgeon: [name], MD;  Location: [place];  Service: Endoscopy;  Laterality: N/A;  . COLONOSCOPY WITH PROPOFOL N/A 01/12/2018   Procedure: COLONOSCOPY WITH PROPOFOL;  Surgeon: Tol[place], Benay Pike, MD;  Location: [facility] ENDOSCOPY;  Service: Endoscopy;  Laterality: N/A;  . CORONARY ANGIOPLASTY    . CORONARY BALLOON ANGIOPLASTY N/A 06/29/2017   Procedure: CORONARY BALLOON ANGIOPLASTY;  Surgeon: Wellington Hampshire, MD;  Location: [place] CV LAB;  Service: Cardiovascular;  Laterality: N/A;  . CORONARY BALLOON ANGIOPLASTY N/A 09/20/2017   Procedure: CORONARY BALLOON ANGIOPLASTY;  Surgeon: Wellington Hampshire, MD;  Location: [place] CV LAB;  Service: Cardiovascular;  Laterality: N/A;  . DILATION AND CURETTAGE OF UTERUS    . ESOPHAGOGASTRODUODENOSCOPY (EGD) WITH PROPOFOL N/A 09/14/2014   Procedure: ESOPHAGOGASTRODUODENOSCOPY (EGD) WITH PROPOFOL;  Surgeon: Josefine Class, MD;  Location[facility]OSCOPY;  Service: Endoscopy;  Laterality: N/A;  . ESOPHAGOGASTRODUODENOSCOPY (EGD) WITH PROPOFOL N/A 04/27/2016   Procedure: ESOPHAGOGASTRODUODENOSCOPY (EGD) WITH PROPOFOL;  Surgeon: [name], MD;  Location: [place];  Service: Endoscopy;  Laterality: N/A;  Diabetic - oral meds  . ESOPHAGOGASTRODUODENOSCOPY (EGD) WITH PROPOFOL N/A 01/12/2018   Procedure: ESOPHAGOGASTRODUODENOSCOPY (EGD) WITH PROPOFOL;  Surgeon: Tol[place],  Benay Pike, MD;  Location: [facility] ENDOSCOPY;  Service: Endoscopy;  Laterality: N/A;  . GASTRIC BYPASS  09/2009   [facility]   . Left Carotid to sublcavian artery bypass w/ subclavian artery ligation     a. Performed @ Center.  . LEFT HEART CATH AND CORONARY ANGIOGRAPHY Left 06/29/2017   Procedure: LEFT HEART CATH AND CORONARY ANGIOGRAPHY;  Surgeon: Wellington Hampshire, MD;  Location: [place] CV LAB;  Service: Cardiovascular;  Laterality: Left;  . LEFT HEART CATH AND CORONARY ANGIOGRAPHY N/A 09/20/2017   Procedure: LEFT HEART CATH AND CORONARY ANGIOGRAPHY;  Surgeon: Wellington Hampshire, MD;  Location: [place] CV LAB;  Service: Cardiovascular;  Laterality: N/A;  . LEFT HEART CATH AND CORONARY ANGIOGRAPHY N/A 12/20/2017   Procedure: LEFT HEART CATH AND CORONARY ANGIOGRAPHY;  Surgeon: Wellington Hampshire, MD;  Location: [place] CV LAB;  Service: Cardiovascular;  Laterality: N/A;  .  MELANOMA EXCISION  2016   Dr. Evorn Gong  . [place]  2002  . RIGHT OOPHORECTOMY    . SHOULDER ARTHROSCOPY WITH OPEN ROTATOR CUFF REPAIR Right 01/07/2016   Procedure: SHOULDER ARTHROSCOPY WITH DEBRIDMENT, SUBACHROMIAL DECOMPRESSION;  Surgeon: Corky Mull, MD;  Location: [facility];  Service: Orthopedics;  Laterality: Right;  . SHOULDER ARTHROSCOPY WITH OPEN ROTATOR CUFF REPAIR Right 03/16/2017   Procedure: SHOULDER ARTHROSCOPY WITH OPEN ROTATOR CUFF REPAIR POSSIBLE BICEPS TENODESIS;  Surgeon: Corky Mull, MD;  Location: [facility] OR[facility];  Service: Orthopedics;  Laterality: Right;  . TRIGGER FINGER RELEASE Right     [place]   Patient Active Problem List   Diagnosis Date Noted  . MI, acute, non ST segment elevation ([place])   . Effort angina ([place]) 06/29/2017  . Unstable angina ([place]) 06/24/2017  . Syncope 04/09/2017  . Insomnia 03/18/2017  . Ischemic cardiomyopathy   . Arthritis   . Anxiety   . Acute colitis 01/27/2017  . Hx of colonic polyps   . Bariatric surgery status   . Sinus  tachycardia 02/27/2016  . H/O medication noncompliance 12/14/2015  . Acute pyelonephritis   . Emesis   . CKD (chronic kidney disease), stage IV ([place])   . Atherosclerosis of native coronary artery of native heart with stable angina pectoris ([place])   . Hypertensive heart disease   . CKD (chronic kidney disease), stage III ([place]) 06/05/2015  . Presence of drug coated stent in LAD coronary artery & now in D1 06/04/2015  . Colitis 06/03/2015  . Carotid stenosis 04/30/2015  . Type 2 diabetes mellitus with stage 3 chronic kidney disease, without long-term current use of insulin ([place])   . Atypical chest pain   . Stable angina pectoris ([place]) 04/17/2015  . Iron deficiency anemia 03/22/2015  . Vitamin B12 deficiency 02/18/2015  . Misuse of medications for pain 02/18/2015  . Severe recurrent major depression without psychotic features ([place]) 02/15/2015  . Helicobacter pylori infection 11/23/2014  . Hemiparesis, left ([place]) 11/23/2014  . Benign neoplasm of colon 11/20/2014  . Malignant melanoma ([place]) 08/25/2014  . Chronic systolic CHF (congestive heart failure) ([place])   . Incomplete bladder emptying 07/12/2014  . Adult hypothyroidism 12/30/2013  . Aberrant subclavian artery 11/17/2013  . Multiple sclerosis ([place]) 11/02/2013  . History of CVA (cerebrovascular accident) 06/20/2013  . Headache, migraine 05/29/2013  . Hyperlipidemia   . GERD (gastroesophageal reflux disease)   . Neuropathy ([place]) 01/02/2011  . Stroke ([place]) 06/21/2008  . Essential hypertension 05/01/2008      Prior to Admission medications   Medication Sig Start Date End Date Taking? Authorizing Provider  ALPRAZolam (XANAX) 1 MG tablet TAKE 1 TABLET (1 MG TOTAL) BY MOUTH DAILY AS NEEDED FOR ANXIETY. 12/14/17   [name], MD  amLODipine (NORVASC) 5 MG tablet Take 1 tablet (5 mg total) by mouth daily. 11/29/17   Minna Merritts, MD  aspirin 81 MG chewable tablet Chew 1 tablet (81 mg total) by mouth daily. Patient taking  differently: Chew 325 mg by mouth daily.  07/01/17   Dunn, Areta Haber, PA-C  Blood Glucose Monitoring Suppl (ONE TOUCH ULTRA 2) w/Device KIT Use to check blood sugar once a day. Dx. E11.9 03/18/17   [name], MD  buPROPion (WELLBUTRIN XL) 150 MG 24 hr tablet TAKE 1 TABLET BY MOUTH DAILY 08/10/17   [name], MD  ciprofloxacin (CIPRO) 500 MG tablet Take 1 tablet (500 mg total) by mouth 2 (two) times daily for 7  days. 02/24/18 03/03/18  [name], MD  citalopram (CELEXA) 40 MG tablet Take 1 tablet (40 mg total) by mouth daily. 06/08/17   [name], MD  clopidogrel (PLAVIX) 75 MG tablet Take 1 tablet (75 mg total) by mouth daily with breakfast. 07/01/17   Dunn, Areta Haber, PA-C  Cyanocobalamin (VITAMIN B 12 PO) Take 1,000 mcg by mouth daily.     [provider]  gabapentin (NEURONTIN) 300 MG capsule TAKE 1 CAPSULE BY MOUTH TWICE A DAY Patient taking differently: Take 300 mg by mouth 2 (two) times daily.  10/30/17   [name], MD  isosorbide mononitrate (IMDUR) 120 MG 24 hr tablet Take 1 tablet (120 mg total) by mouth daily. 10/13/17   Dunn, Areta Haber, PA-C  levothyroxine (SYNTHROID, LEVOTHROID) 25 MCG tablet Take 1 tablet (25 mcg total) daily before breakfast by mouth. 12/18/16   [name], MD  losartan (COZAAR) 25 MG tablet Take 1/2 tablet (12.5 mg) by mouth once daily Patient taking differently: Take 12.5 mg by mouth daily.  10/13/17   Dunn, Areta Haber, PA-C  metFORMIN (GLUCOPHAGE-XR) 500 MG 24 hr tablet Take 500 mg by mouth daily with breakfast.    [provider]  metoprolol succinate (TOPROL-XL) 50 MG 24 hr tablet Take 1 tablet (50 mg total) by mouth daily. Take with or immediately following a meal. Patient taking differently: Take 100 mg by mouth daily. Take with or immediately following a meal. 07/01/17   Dunn, Areta Haber, PA-C  nitroGLYCERIN (NITROSTAT) 0.4 MG SL tablet Place 1 tablet (0.4 mg total) under the tongue every 5 (five) minutes as needed for chest pain.  06/02/17   Minna Merritts, MD  pantoprazole (PROTONIX) 40 MG tablet Take 1 tablet (40 mg total) by mouth 2 (two) times daily. 05/11/16   [name], MD  promethazine (PHENERGAN) 25 MG tablet Take 1 tablet (25 mg total) by mouth every 6 (six) hours as needed for nausea or vomiting. 10/20/17   [name], MD  pyridostigmine (MESTINON) 60 MG tablet Take 60 mg by mouth 3 (three) times daily.  07/26/17   [provider]  rosuvastatin (CRESTOR) 40 MG tablet Take 1 tablet (40 mg total) by mouth daily. 03/01/18 05/30/18  Theora Gianotti, NP  zolpidem (AMBIEN) 5 MG tablet Take 1 tablet (5 mg total) by mouth at bedtime as needed for up to 7 days for sleep. 03/01/18 03/08/18  Theora Gianotti, NP    Allergies Lipitor [atorvastatin] and Tramadol    Social History Social History   Tobacco Use  . Smoking status: Former Smoker    Types: Cigarettes    Last attempt to quit: 08/31/1994    Years since quitting: 23.5  . Smokeless tobacco: Never Used  . Tobacco comment: quit 28 years ago  Substance Use Topics  . Alcohol use: No    Alcohol/week: 0.0 standard drinks  . Drug use: No    Review of Systems Patient denies headaches, rhinorrhea, blurry vision, numbness, shortness of breath, chest pain, edema, cough, abdominal pain, nausea, vomiting, diarrhea, dysuria, fevers, rashes or hallucinations unless otherwise stated above in HPI. ____________________________________________   PHYSICAL EXAM:  VITAL SIGNS: Vitals:   03/01/18 1824  BP: 132/67  Pulse: (!) 32  Resp: 20  Temp: 98.2 F (36.8 C)  SpO2: 100%    Constitutional: Alert and oriented.  Eyes: Conjunctivae are normal.  Head: Atraumatic. Nose: No congestion/rhinnorhea. Mouth/Throat: Mucous membranes are moist.   Neck: No stridor.  Painless ROM.  Cardiovascular: Normal rate, regular rhythm. Grossly normal heart sounds.  Good peripheral circulation. Respiratory: Normal respiratory effort.  No retractions.  Lungs CTAB. Gastrointestinal: Soft and nontender. No distention. No abdominal bruits. No CVA tenderness. Genitourinary:  Musculoskeletal: No lower extremity tenderness nor edema.  No joint effusions. Neurologic:  Normal speech and language. No gross focal neurologic deficits are appreciated. No facial droop Skin:  Skin is warm, dry and intact. No rash noted. Psychiatric: Mood and affect are anxious.  Blunted affect, disorganized thought process.   ____________________________________________   LABS (all labs ordered are listed, but only abnormal results are displayed)  Results for orders placed or performed during the hospital encounter of 03/01/18 (from the past 24 hour(s))  cbc     Status: Abnormal   Collection Time: 03/01/18  6:50 PM  Result Value Ref Range   WBC 17.2 (H) 4.0 - 10.5 K/uL   RBC 3.84 (L) 3.87 - 5.11 MIL/uL   Hemoglobin 10.0 (L) 12.0 - 15.0 g/dL   HCT 31.6 (L) 36.0 - 46.0 %   MCV 82.3 80.0 - 100.0 fL   MCH 26.0 26.0 - 34.0 pg   MCHC 31.6 30.0 - 36.0 g/dL   RDW 13.8 11.5 - 15.5 %   Platelets 231 150 - 400 K/uL   nRBC 0.0 0.0 - 0.2 %   ____________________________________________ ____________________________________________   PROCEDURES  Procedure(s) performed:  Procedures    Critical Care performed: no ____________________________________________   INITIAL IMPRESSION / ASSESSMENT AND PLAN / ED COURSE  Pertinent labs & imaging results that were available during my care of the patient were reviewed by me and considered in my medical decision making (see chart for details).   DDX: Psychosis, delirium, medication effect, noncompliance, polysubstance abuse, Si, [place], depression   [name] is a 47 y.o. who presents to the ED with for evaluation of hallucinations and abnormal behavior.  Patient has psych history of bipolar.  Laboratory testing was ordered to evaluation for underlying electrolyte derangement or signs of underlying organic pathology to  explain today's presentation.  Based on history and physical and laboratory evaluation, it appears that the patient's presentation is 2/2 underlying psychiatric disorder and will require further evaluation and management by inpatient psychiatry.  Patient was  made an IVC due to hallucinations and disorganzied thoughts.  Disposition pending psychiatric evaluation.       As part of my medical decision making, I reviewed the following data within the [place] notes reviewed and incorporated, Labs reviewed, notes from prior ED visits and [place] Controlled Substance Database   ____________________________________________   FINAL CLINICAL IMPRESSION(S) / ED DIAGNOSES  Final diagnoses:  Hallucinations      NEW MEDICATIONS STARTED DURING THIS VISIT:  New Prescriptions   No medications on file     Note:  This document was prepared using Dragon voice recognition software and may include unintentional dictation errors.    Merlyn Lot, MD 03/01/18 1911

## 2018-03-01 NOTE — Progress Notes (Signed)
Office Visit    Patient Name: [name] Date of Encounter: 03/01/2018  Primary Care Provider:  [name], MD Primary Cardiologist:  Ida Rogue, MD  Chief Complaint    48 year old female with a history of CAD status post prior interventions, hypertension, hyperlipidemia, diabetes, stage III chronic kidney disease, prior strokes, obesity status post gastric bypass, prior left carotid to subclavian artery bypass with subclavian artery ligation, anemia, bipolar disorder, and aortic arch aneurysm, who presents for follow-up after recent stroke.  Past Medical History    Past Medical History:  Diagnosis Date  . Anemia    iron deficiency anemia  . Aortic arch aneurysm ([place])   . Bipolar disorder ([place])   . BRCA negative 2014  . CAD (coronary artery disease)    a. 08/2003 Cath: LAD 30-40-med Rx; b. 11/2014 PCI: LAD 55m(3.25x23 Xience Alpine DES); c. 06/2015 PCI: D1 (2.25x12 Resolute Integrity DES); d. 06/2017 PCI: Patent mLAD stent, D2 95 (PTCA); e. 09/2017 PCI: D2 99ost (CBA); d. 12/2017 Cath: LM nl, LAD 336m80d (small), D1 40ost, D2 95ost, LCX 40p, RCA 40ost/p->Med rx for D2 given restenosis.  . CKD (chronic kidney disease), stage III ([place]  . Colon polyp   . CVA (cerebral vascular accident) ([facility]   Left side weakness.   . Diabetes ([facility]  . Family history of breast cancer    BRCA neg 2014  . Gastric ulcer 04/27/2011  . History of echocardiogram    a. 03/2017 Echo: EF 60-65%, no rwma; b. 02/2018 Echo: EF 60-65%, no rwma. Nl RV fxn. No cardiac source of emboli (admitted w/ stroke).  . [place] (hypertension)   . Hyperlipemia   . Hypothyroid   . Malignant melanoma of skin of scalp ([facility]  . MI, acute, non ST segment elevation ([place]  . Neuromuscular disorder ([place]  . Orthostatic hypotension   . S/P drug eluting coronary stent placement 06/04/2015  . Sepsis ([facility]2/14/2019  . [facility] ([facility]   a. 02/2018 MRI: 35m72mate acute/early subacute L medial frontal lobe inarct; b.  02/2018 MRA No large vessel occlusion or aneurysm. Mod to sev L P2 stenosis. thready L vertebral artery, diffusely dzs'd; c. 02/2018 Carotid U/S: <50% bilat ICA dzs.   Past Surgical History:  Procedure Laterality Date  . APPENDECTOMY    . CARDIAC CATHETERIZATION N/A 11/09/2014   Procedure: Coronary Angiography;  Surgeon: TimMinna MerrittsD;  [facility];  Service: Cardiovascular;  Laterality: N/A;  . CARDIAC CATHETERIZATION N/A 11/12/2014   Procedure: Coronary Stent Intervention;  Surgeon: AleIsaias CowmanD;  [facility];  Service: Cardiovascular;  Laterality: N/A;  . CARDIAC CATHETERIZATION N/A 04/18/2015   Procedure: Left Heart Cath and Coronary Angiography;  Surgeon: TimMinna MerrittsD;  [facility];  Service: Cardiovascular;  Laterality: N/A;  . CARDIAC CATHETERIZATION Left 06/04/2015   Procedure: Left Heart Cath and Coronary Angiography;  Surgeon: MuhWellington HampshireD;  [place];  Service: Cardiovascular;  Laterality: Left;  . CARDIAC CATHETERIZATION N/A 06/04/2015   Procedure: Coronary Stent Intervention;  Surgeon: MuhWellington HampshireD;  [facility];  Service: Cardiovascular;  Laterality: N/A;  . CESAREAN SECTION  2001  . CHOLECYSTECTOMY N/A 11/18/2016   Procedure: LAPAROSCOPIC CHOLECYSTECTOMY WITH INTRAOPERATIVE CHOLANGIOGRAM;  Surgeon: SanChristene LyeD;  [facility];  Service: General;  Laterality: N/A;  . COLONOSCOPY WITH PROPOFOL N/A 04/27/2016  Procedure: COLONOSCOPY WITH PROPOFOL;  Surgeon: [name], MD;  Location: [place];  Service: Endoscopy;  Laterality: N/A;  . COLONOSCOPY WITH PROPOFOL N/A 01/12/2018   Procedure: COLONOSCOPY WITH PROPOFOL;  Surgeon: Toledo, Benay Pike, MD;  Location[facility];  Service: Endoscopy;  Laterality: N/A;  . CORONARY ANGIOPLASTY    . CORONARY BALLOON ANGIOPLASTY N/A 06/29/2017   Procedure: CORONARY BALLOON  ANGIOPLASTY;  Surgeon: Wellington Hampshire, MD;  Location: [place] CV LAB;  Service: Cardiovascular;  Laterality: N/A;  . CORONARY BALLOON ANGIOPLASTY N/A 09/20/2017   Procedure: CORONARY BALLOON ANGIOPLASTY;  Surgeon: Wellington Hampshire, MD;  Location: [place] [facility] LAB;  Service: Cardiovascular;  Laterality: N/A;  . DILATION AND CURETTAGE OF UTERUS    . ESOPHAGOGASTRODUODENOSCOPY (EGD) WITH PROPOFOL N/A 09/14/2014   Procedure: ESOPHAGOGASTRODUODENOSCOPY (EGD) WITH PROPOFOL;  Surgeon: Josefine Class, MD;  [facility];  Service: Endoscopy;  Laterality: N/A;  . ESOPHAGOGASTRODUODENOSCOPY (EGD) WITH PROPOFOL N/A 04/27/2016   Procedure: ESOPHAGOGASTRODUODENOSCOPY (EGD) WITH PROPOFOL;  Surgeon: [name], MD;  Location: [place];  Service: Endoscopy;  Laterality: N/A;  Diabetic - oral meds  . ESOPHAGOGASTRODUODENOSCOPY (EGD) WITH PROPOFOL N/A 01/12/2018   Procedure: ESOPHAGOGASTRODUODENOSCOPY (EGD) WITH PROPOFOL;  Surgeon: Toledo, Benay Pike, MD;  Location: [facility];  Service: Endoscopy;  Laterality: N/A;  . GASTRIC BYPASS  09/2009   [facility]   . Left Carotid to sublcavian artery bypass w/ subclavian artery ligation     a. Performed @ [facility].  . LEFT HEART CATH AND CORONARY ANGIOGRAPHY Left 06/29/2017   Procedure: LEFT HEART CATH AND CORONARY ANGIOGRAPHY;  Surgeon: Wellington Hampshire, MD;  Location: [place] CV LAB;  Service: Cardiovascular;  Laterality: Left;  . LEFT HEART CATH AND CORONARY ANGIOGRAPHY N/A 09/20/2017   Procedure: LEFT HEART CATH AND CORONARY ANGIOGRAPHY;  Surgeon: Wellington Hampshire, MD;  Location: [place] CV LAB;  Service: Cardiovascular;  Laterality: N/A;  . LEFT HEART CATH AND CORONARY ANGIOGRAPHY N/A 12/20/2017   Procedure: LEFT HEART CATH AND CORONARY ANGIOGRAPHY;  Surgeon: Wellington Hampshire, MD;  Location: [place] CV LAB;  Service: Cardiovascular;  Laterality: N/A;  . MELANOMA EXCISION  2016   Dr. Evorn Gong    . [place]  2002  . RIGHT OOPHORECTOMY    . SHOULDER ARTHROSCOPY WITH OPEN ROTATOR CUFF REPAIR Right 01/07/2016   Procedure: SHOULDER ARTHROSCOPY WITH DEBRIDMENT, SUBACHROMIAL DECOMPRESSION;  Surgeon: Corky Mull, MD;  Location: [facility];  Service: Orthopedics;  Laterality: Right;  . SHOULDER ARTHROSCOPY WITH OPEN ROTATOR CUFF REPAIR Right 03/16/2017   Procedure: SHOULDER ARTHROSCOPY WITH OPEN ROTATOR CUFF REPAIR POSSIBLE BICEPS TENODESIS;  Surgeon: Corky Mull, MD;  Location: [facility];  Service: Orthopedics;  Laterality: Right;  . TRIGGER FINGER RELEASE Right     Middle Finger    Allergies  Allergies  Allergen Reactions  . Lipitor [Atorvastatin] Other (See Comments)    Leg pains  . Tramadol Other (See Comments)    Mouth feels like it's on fire    History of Present Illness    48 year old female with the above complex past medical history including CAD status post prior interventions, hypertension, hyperlipidemia, type 2 diabetes mellitus, stage III chronic kidney disease, prior stroke, obesity status post gastric bypass, prior left carotid to subclavian artery bypass with subclavian artery ligation, anemia, bipolar disorder, and aortic arch aneurysm.  She was initially diagnosed with CAD in July 2005 at which time catheterization via moderate LAD stenosis, which was medically managed.  More recently, in October 2016, she required PCI and drug-eluting stent placement to her mid LAD.  In May 2017, catheterization revealed patency of the LAD stent but she had developed significant first diagonal disease and this successfully treated with a drug-eluting stent.  She had recurrent angina May 2019 and catheterization at that time showed severe stenosis in the second diagonal and this was treated with balloon angioplasty.  She had recurrent discomfort in late July 2019 and subsequently admitted to [facility] with catheterization revealing restenosis in the second diagonal.  This was  treated with Cutting Balloon angioplasty.  Most recently, she was readmitted in November 2019 due to recurrent chest pain and once again, she was found to have 95% ostial stenosis/restenosis in the second diagonal with significant apical LAD disease.  Given recurrent restenosis within the diagonal, it was felt that medical therapy was warranted and she has been maintained on aspirin, statin, and moderate to high-dose nitrate therapy.  She was last seen in clinic on December 4, at which time she was doing well from a cardiac standpoint.  Unfortunately, she was admitted to [facility] on January 16, with a one-week history of left leg weakness and slurring of her speech.  MRI showed a late acute/early subacute left medial frontal lobe infarct.  MRA was negative for large vessel stenosis or carotid disease.  Echocardiogram showed normal LV function without cardiac source of emboli.  She was managed with aspirin and Plavix.  LDL during admission was 109 and her Crestor dose has since been advanced to 20 mg daily.  Since her discharge, she has been working with physical therapy and has had improved strength and almost complete resolution of slurring of speech.  She has had one episode of chest discomfort for which she took sublingual nitroglycerin with almost immediate relief.  She has not had any significant dyspnea, PND, orthopnea, dizziness, syncope, edema, or early satiety.  She says that she has been having a lot of trouble sleeping since her hospitalization and has not slept in 3 days.  In that setting, she is been having worsening headaches.  She wishes for at least short-term treatment for insomnia.  Home Medications    Prior to Admission medications   Medication Sig Start Date End Date Taking? Authorizing Provider  ALPRAZolam (XANAX) 1 MG tablet TAKE 1 TABLET (1 MG TOTAL) BY MOUTH DAILY AS NEEDED FOR ANXIETY. 12/14/17   [name], MD  amLODipine (NORVASC) 5 MG tablet Take 1 tablet (5 mg total) by mouth  daily. 11/29/17   Minna Merritts, MD  aspirin 81 MG chewable tablet Chew 1 tablet (81 mg total) by mouth daily. Patient taking differently: Chew 325 mg by mouth daily.  07/01/17   Dunn, Areta Haber, PA-C  Blood Glucose Monitoring Suppl (ONE TOUCH ULTRA 2) w/Device KIT Use to check blood sugar once a day. Dx. E11.9 03/18/17   [name], MD  buPROPion (WELLBUTRIN XL) 150 MG 24 hr tablet TAKE 1 TABLET BY MOUTH DAILY 08/10/17   [name], MD  ciprofloxacin (CIPRO) 500 MG tablet Take 1 tablet (500 mg total) by mouth 2 (two) times daily for 7 days. 02/24/18 03/03/18  [name], MD  citalopram (CELEXA) 40 MG tablet Take 1 tablet (40 mg total) by mouth daily. 06/08/17   [name], MD  clopidogrel (PLAVIX) 75 MG tablet Take 1 tablet (75 mg total) by mouth daily with breakfast. 07/01/17   Rise Mu, PA-C  Cyanocobalamin (VITAMIN B  12 PO) Take 1,000 mcg by mouth daily.     [provider]  gabapentin (NEURONTIN) 300 MG capsule TAKE 1 CAPSULE BY MOUTH TWICE A DAY Patient taking differently: Take 300 mg by mouth 2 (two) times daily.  10/30/17   [name], MD  isosorbide mononitrate (IMDUR) 120 MG 24 hr tablet Take 1 tablet (120 mg total) by mouth daily. 10/13/17   Dunn, Areta Haber, PA-C  levothyroxine (SYNTHROID, LEVOTHROID) 25 MCG tablet Take 1 tablet (25 mcg total) daily before breakfast by mouth. 12/18/16   [name], MD  losartan (COZAAR) 25 MG tablet Take 1/2 tablet (12.5 mg) by mouth once daily Patient taking differently: Take 12.5 mg by mouth daily.  10/13/17   Dunn, Areta Haber, PA-C  metFORMIN (GLUCOPHAGE-XR) 500 MG 24 hr tablet Take 500 mg by mouth daily with breakfast.    [provider]  metoprolol succinate (TOPROL-XL) 50 MG 24 hr tablet Take 1 tablet (50 mg total) by mouth daily. Take with or immediately following a meal. Patient taking differently: Take 100 mg by mouth daily. Take with or immediately following a meal. 07/01/17   Dunn, Areta Haber, PA-C    nitroGLYCERIN (NITROSTAT) 0.4 MG SL tablet Place 1 tablet (0.4 mg total) under the tongue every 5 (five) minutes as needed for chest pain. 06/02/17   Minna Merritts, MD  pantoprazole (PROTONIX) 40 MG tablet Take 1 tablet (40 mg total) by mouth 2 (two) times daily. 05/11/16   [name], MD  promethazine (PHENERGAN) 25 MG tablet Take 1 tablet (25 mg total) by mouth every 6 (six) hours as needed for nausea or vomiting. 10/20/17   [name], MD  pyridostigmine (MESTINON) 60 MG tablet Take 60 mg by mouth 3 (three) times daily.  07/26/17   [provider]  rosuvastatin (CRESTOR) 20 MG tablet Take 1 tablet (20 mg total) by mouth daily. 02/24/18   [name], MD    Review of Systems    Recent stroke with improvement in strength and almost complete resolution of slurring of speech.  She has had stable angina which has been nitrate responsive.  She denies dyspnea, palpitations, PND, orthopnea, dizziness, syncope, edema, or early satiety.  All other systems reviewed and are otherwise negative except as noted above.  Physical Exam    VS:  BP 130/80 (BP Location: Left Arm, Patient Position: Sitting, Cuff Size: Normal)   Pulse 67   Ht _0  (1.575 m)   Wt 179 lb (81.2 kg)   BMI 32.74 kg/m  , BMI Body mass index is 32.74 kg/m. GEN: Well nourished, well developed, in no acute distress. HEENT: normal. Neck: Supple, no JVD, carotid bruits, or masses. Cardiac: RRR, no murmurs, rubs, or gallops. No clubbing, cyanosis, edema.  Radials/PT 2+ and equal bilaterally.  Respiratory:  Respirations regular and unlabored, clear to auscultation bilaterally. GI: Soft, nontender, nondistended, BS + x 4. MS: no deformity or atrophy. Skin: warm and dry, no rash. Neuro:  Strength and sensation are intact. Psych: Normal affect.  Accessory Clinical Findings    ECG personally reviewed by me today -regular sinus rhythm, 67- no acute changes.  Lab Results  Component Value Date   CREATININE  1.81 (H) 02/17/2018   BUN 22 (H) 02/17/2018   NA 138 02/17/2018   K 4.2 02/17/2018   CL 113 (H) 02/17/2018   CO2 18 (L) 02/17/2018    Lab Results  Component Value Date   CHOL 161 02/18/2018  HDL 31 (L) 02/18/2018   LDLCALC 109 (H) 02/18/2018   LDLDIRECT 83.3 01/26/2011   TRIG 103 02/18/2018   CHOLHDL 5.2 02/18/2018     Assessment & Plan    1.  Coronary artery disease: Status post multiple interventions with most recent catheterization in November 2019 revealing recurrent restenosis within the second diagonal as well as apical LAD disease.  She has been medically managed with aspirin, Plavix, nitrate, beta-blocker, ARB, and statin therapy.  She does continue to have stable angina which is nitrate responsive.  She has previously tried Ranexa but did not feel that it made any difference and was expensive.  We discussed potentially going up on nitrate further however, she has been having headaches in the setting of insomnia and I think that further titration of nitro at this time may simply complicate that picture.  She has been tolerating physical therapy well status post stroke.  2.  Stroke: Recent admission for weakness and slurred speech with finding of left medial frontal lobe infarct on MRI.  MRA without significant large vessel disease.  She has been working with physical therapy and so far tolerating this well.  She has had significant improvement since discharge and remains on aspirin and Plavix as well as statin therapy.  3.  Essential hypertension: Stable.  4.  Hyperlipidemia: Previously on Crestor 10 mg.  LDL was 35 in August 2019 and our records indicate that she was only on 10 mg at that time.  Regardless, most recent LDL was 109 on January 17.  She reports that she has been compliant with her rosuvastatin.  Dose was recently increased to 20 mg last week and she has tolerated that well.  I will titrate to 40 mg today.  She already has follow-up lipids and LFTs scheduled primary  care in approximately 5 more weeks.  5.  Stage III chronic kidney disease: Creatinine stable upon recent admission.  She is on chronic ARB therapy.  6.  Insomnia: Patient says that since her stroke and hospitalization, she has had a lot of trouble sleeping.  She has previously used Ambien with some success.  She has not slept well in the past 3 to 5 days.  I have provided her with prescription for Ambien 5 mg nightly as needed-7 tablets with no refills and the recommended that she follow-up with primary care if insomnia persists.  She thinks that will get better if she can just get back on a normal sleep-wake cycle.  7.  DMII: A1c 5.8 on 1/17.  On metformin and followed by primary care.  8.  Disposition: Follow-up in 2 months.  Murray Hodgkins, NP 03/01/2018, 12:44 PM

## 2018-03-01 NOTE — ED Triage Notes (Signed)
Patient states "I am not crazy.  I am just extremely tired.  I took two xanax today and Everyone is freaking out".  Patient arrives with Son who states patient has not slept in 5 days and is hallucinating.  Per son, patient has seen her Grandmother and the dog is talking to her.  Denies SI/ HI.  Patient states "I just want to sleep.  I want a  Bed".

## 2018-03-01 NOTE — ED Notes (Signed)
Pt. Transferred to [place] from ED to room after screening for contraband. Report to include Situation, Background, Assessment and Recommendations from [facility]. Pt. Oriented to unit including Q15 minute rounds as well as the security cameras for their protection. Patient is alert and oriented, warm and dry in no acute distress. Patient denies SI, HI, and AVH. She said she just need some sleep and she will be fine.Pt. Encouraged to let me know if needs arise.

## 2018-03-01 NOTE — BH Assessment (Signed)
TTS in to see pt however, pt has taken some medications that has caused her to be very sleepy. Pt states 'Please all I want to do is get some sleep". This Probation officer will return in a few hours when pt is appropriate for assessment.

## 2018-03-01 NOTE — ED Notes (Signed)
Gave pt blanket,food tray with a cup of water.Still waiting on a urine sample.

## 2018-03-01 NOTE — ED Notes (Signed)
Patient Belongings:  Black fuzzy coat Tan bra Pink tee shirt Pink and grey panties Blue pants White socks [employer]   Purse sent home with son.  Glasses and black and blue cane remain with patient

## 2018-03-01 NOTE — ED Notes (Signed)
IV Dcd with cath intact.

## 2018-03-01 NOTE — ED Notes (Signed)
Pt falling asleep in triage.

## 2018-03-01 NOTE — Patient Instructions (Signed)
Medication Instructions:  Your physician has recommended you make the following change in your medication: 1- INCREASE Crestor to 1 tablet (40mg ) once daily 2- START Ambien Take 1 tablet (5 mg total) by mouth at bedtime as needed for up to 7 days for sleep  Refills must come from your PCP If you need a refill on your cardiac medications before your next appointment, please call your pharmacy.   Lab work: Please continue with labs at your PCP office.   If you have labs (blood work) drawn today and your tests are completely normal, you will receive your results only by: Marland Kitchen MyChart Message (if you have MyChart) OR . A paper copy in the mail If you have any lab test that is abnormal or we need to change your treatment, we will call you to review the results.  Testing/Procedures: None ordered   Follow-Up: At [facility], you and your health needs are our priority.  As part of our continuing mission to provide you with exceptional heart care, we have created designated Provider Care Teams.  These Care Teams include your primary Cardiologist (physician) and Advanced Practice Providers (APPs -  Physician Assistants and Nurse Practitioners) who all work together to provide you with the care you need, when you need it. You will need a follow up appointment in 2 months. You may see Ida Rogue, MD or one of the following Advanced Practice Providers on your designated Care Team:   Murray Hodgkins, NP Christell Faith, PA-C . Marrianne Mood, PA-C

## 2018-03-01 NOTE — ED Notes (Signed)
Pt. Transferred from Triage to room 20 after dressing out and screening for contraband. Pt. Oriented to [place] including Q15 minute rounds as well as Engineer, drilling for their protection. Patient is alert and oriented, warm and dry in no acute distress. Patient denies SI, HI,but states she sees [employer] and hears voices without command. Pt. Encouraged to let me know if needs arise.

## 2018-03-01 NOTE — Telephone Encounter (Signed)
Patient is requesting a refill on the following medications  pantoprazole (PROTONIX) 40 MG tablet   promethazine (PHENERGAN) 25 MG tablet  She uses [place]

## 2018-03-02 ENCOUNTER — Other Ambulatory Visit: Payer: Self-pay

## 2018-03-02 ENCOUNTER — Inpatient Hospital Stay
Admission: AD | Admit: 2018-03-02 | Discharge: 2018-03-04 | DRG: 880 | Disposition: A | Discharge status: Home / Self Care | Payer: Medicare Other | Source: Intra-hospital | Attending: Psychiatry | Admitting: Psychiatry

## 2018-03-02 ENCOUNTER — Encounter: Payer: Self-pay | Admitting: Emergency Medicine

## 2018-03-02 DIAGNOSIS — D509 Iron deficiency anemia, unspecified: Secondary | ICD-10-CM | POA: Diagnosis present

## 2018-03-02 DIAGNOSIS — Z955 Presence of coronary angioplasty implant and graft: Secondary | ICD-10-CM

## 2018-03-02 DIAGNOSIS — G7000 Myasthenia gravis without (acute) exacerbation: Secondary | ICD-10-CM | POA: Diagnosis present

## 2018-03-02 DIAGNOSIS — G4700 Insomnia, unspecified: Secondary | ICD-10-CM | POA: Diagnosis present

## 2018-03-02 DIAGNOSIS — R410 Disorientation, unspecified: Secondary | ICD-10-CM

## 2018-03-02 DIAGNOSIS — Z85820 Personal history of malignant melanoma of skin: Secondary | ICD-10-CM

## 2018-03-02 DIAGNOSIS — G35 Multiple sclerosis: Secondary | ICD-10-CM | POA: Diagnosis present

## 2018-03-02 DIAGNOSIS — Z79890 Hormone replacement therapy (postmenopausal): Secondary | ICD-10-CM

## 2018-03-02 DIAGNOSIS — I69354 Hemiplegia and hemiparesis following cerebral infarction affecting left non-dominant side: Secondary | ICD-10-CM | POA: Diagnosis not present

## 2018-03-02 DIAGNOSIS — E538 Deficiency of other specified B group vitamins: Secondary | ICD-10-CM | POA: Diagnosis present

## 2018-03-02 DIAGNOSIS — F411 Generalized anxiety disorder: Secondary | ICD-10-CM | POA: Diagnosis present

## 2018-03-02 DIAGNOSIS — Z818 Family history of other mental and behavioral disorders: Secondary | ICD-10-CM

## 2018-03-02 DIAGNOSIS — E785 Hyperlipidemia, unspecified: Secondary | ICD-10-CM | POA: Diagnosis present

## 2018-03-02 DIAGNOSIS — Z8249 Family history of ischemic heart disease and other diseases of the circulatory system: Secondary | ICD-10-CM

## 2018-03-02 DIAGNOSIS — K219 Gastro-esophageal reflux disease without esophagitis: Secondary | ICD-10-CM | POA: Diagnosis present

## 2018-03-02 DIAGNOSIS — I25118 Atherosclerotic heart disease of native coronary artery with other forms of angina pectoris: Secondary | ICD-10-CM | POA: Diagnosis present

## 2018-03-02 DIAGNOSIS — Z841 Family history of disorders of kidney and ureter: Secondary | ICD-10-CM

## 2018-03-02 DIAGNOSIS — E1122 Type 2 diabetes mellitus with diabetic chronic kidney disease: Secondary | ICD-10-CM | POA: Diagnosis present

## 2018-03-02 DIAGNOSIS — N183 Chronic kidney disease, stage 3 (moderate): Secondary | ICD-10-CM | POA: Diagnosis present

## 2018-03-02 DIAGNOSIS — F333 Major depressive disorder, recurrent, severe with psychotic symptoms: Secondary | ICD-10-CM | POA: Diagnosis present

## 2018-03-02 DIAGNOSIS — Z803 Family history of malignant neoplasm of breast: Secondary | ICD-10-CM

## 2018-03-02 DIAGNOSIS — Z7982 Long term (current) use of aspirin: Secondary | ICD-10-CM

## 2018-03-02 DIAGNOSIS — F05 Delirium due to known physiological condition: Secondary | ICD-10-CM | POA: Diagnosis present

## 2018-03-02 DIAGNOSIS — Z8041 Family history of malignant neoplasm of ovary: Secondary | ICD-10-CM | POA: Diagnosis not present

## 2018-03-02 DIAGNOSIS — E1142 Type 2 diabetes mellitus with diabetic polyneuropathy: Secondary | ICD-10-CM | POA: Diagnosis present

## 2018-03-02 DIAGNOSIS — Z800 Family history of malignant neoplasm of digestive organs: Secondary | ICD-10-CM | POA: Diagnosis not present

## 2018-03-02 DIAGNOSIS — T426X5A Adverse effect of other antiepileptic and sedative-hypnotic drugs, initial encounter: Secondary | ICD-10-CM | POA: Diagnosis present

## 2018-03-02 DIAGNOSIS — E039 Hypothyroidism, unspecified: Secondary | ICD-10-CM | POA: Diagnosis present

## 2018-03-02 DIAGNOSIS — I252 Old myocardial infarction: Secondary | ICD-10-CM | POA: Diagnosis not present

## 2018-03-02 DIAGNOSIS — Z823 Family history of stroke: Secondary | ICD-10-CM

## 2018-03-02 DIAGNOSIS — F419 Anxiety disorder, unspecified: Secondary | ICD-10-CM | POA: Diagnosis present

## 2018-03-02 DIAGNOSIS — Z9884 Bariatric surgery status: Secondary | ICD-10-CM

## 2018-03-02 DIAGNOSIS — I129 Hypertensive chronic kidney disease with stage 1 through stage 4 chronic kidney disease, or unspecified chronic kidney disease: Secondary | ICD-10-CM | POA: Diagnosis present

## 2018-03-02 DIAGNOSIS — Z8349 Family history of other endocrine, nutritional and metabolic diseases: Secondary | ICD-10-CM

## 2018-03-02 DIAGNOSIS — G629 Polyneuropathy, unspecified: Secondary | ICD-10-CM

## 2018-03-02 DIAGNOSIS — Z5181 Encounter for therapeutic drug level monitoring: Secondary | ICD-10-CM | POA: Diagnosis not present

## 2018-03-02 DIAGNOSIS — I10 Essential (primary) hypertension: Secondary | ICD-10-CM | POA: Diagnosis present

## 2018-03-02 DIAGNOSIS — I2510 Atherosclerotic heart disease of native coronary artery without angina pectoris: Secondary | ICD-10-CM | POA: Diagnosis not present

## 2018-03-02 DIAGNOSIS — Z808 Family history of malignant neoplasm of other organs or systems: Secondary | ICD-10-CM

## 2018-03-02 DIAGNOSIS — Z90721 Acquired absence of ovaries, unilateral: Secondary | ICD-10-CM

## 2018-03-02 DIAGNOSIS — Z8719 Personal history of other diseases of the digestive system: Secondary | ICD-10-CM

## 2018-03-02 DIAGNOSIS — G379 Demyelinating disease of central nervous system, unspecified: Secondary | ICD-10-CM

## 2018-03-02 DIAGNOSIS — Z8673 Personal history of transient ischemic attack (TIA), and cerebral infarction without residual deficits: Secondary | ICD-10-CM

## 2018-03-02 DIAGNOSIS — Z8711 Personal history of peptic ulcer disease: Secondary | ICD-10-CM

## 2018-03-02 DIAGNOSIS — Z833 Family history of diabetes mellitus: Secondary | ICD-10-CM

## 2018-03-02 LAB — CULTURE, URINE COMPREHENSIVE

## 2018-03-02 LAB — SPECIMEN STATUS REPORT

## 2018-03-02 MED ORDER — ALUM & MAG HYDROXIDE-SIMETH 200-200-20 MG/5ML PO SUSP: 30.0000 mL | ORAL | Status: DC | PRN

## 2018-03-02 MED ORDER — METOPROLOL SUCCINATE ER 25 MG PO TB24
100.0000 mg | ORAL_TABLET | Freq: Every day | ORAL | Status: DC
  Administered 2018-03-03 – 2018-03-04 (×2): 100 mg via ORAL
  Filled 2018-03-02 (×2): qty 4

## 2018-03-02 MED ORDER — ACETAMINOPHEN 325 MG PO TABS: 650.0000 mg | ORAL_TABLET | Freq: Four times a day (QID) | ORAL | Status: DC | PRN

## 2018-03-02 MED ORDER — GABAPENTIN 300 MG PO CAPS
300.0000 mg | ORAL_CAPSULE | Freq: Two times a day (BID) | ORAL | Status: DC
  Administered 2018-03-02 – 2018-03-04 (×4): 300 mg via ORAL
  Filled 2018-03-02 (×4): qty 1

## 2018-03-02 MED ORDER — PROMETHAZINE HCL 25 MG PO TABS: 25.0000 mg | ORAL_TABLET | Freq: Four times a day (QID) | ORAL | Status: DC | PRN

## 2018-03-02 MED ORDER — ALPRAZOLAM 1 MG PO TABS
1.0000 mg | ORAL_TABLET | Freq: Every day | ORAL | Status: DC | PRN
  Administered 2018-03-02: 1 mg via ORAL
  Filled 2018-03-02: qty 1

## 2018-03-02 MED ORDER — CLOPIDOGREL BISULFATE 75 MG PO TABS
75.0000 mg | ORAL_TABLET | Freq: Every day | ORAL | Status: DC
  Administered 2018-03-03 – 2018-03-04 (×2): 75 mg via ORAL
  Filled 2018-03-02 (×2): qty 1

## 2018-03-02 MED ORDER — ROSUVASTATIN CALCIUM 20 MG PO TABS
40.0000 mg | ORAL_TABLET | Freq: Every day | ORAL | Status: DC
  Administered 2018-03-02 – 2018-03-03 (×2): 40 mg via ORAL
  Filled 2018-03-02 (×4): qty 2

## 2018-03-02 MED ORDER — ISOSORBIDE MONONITRATE ER 60 MG PO TB24
120.0000 mg | ORAL_TABLET | Freq: Every day | ORAL | Status: DC
  Administered 2018-03-02 – 2018-03-04 (×3): 120 mg via ORAL
  Filled 2018-03-02 (×4): qty 2

## 2018-03-02 MED ORDER — HYDROXYZINE HCL 25 MG PO TABS: 25.0000 mg | ORAL_TABLET | Freq: Three times a day (TID) | ORAL | Status: DC | PRN

## 2018-03-02 MED ORDER — METFORMIN HCL ER 500 MG PO TB24: 500.0000 mg | ORAL_TABLET | Freq: Every day | ORAL | Status: DC

## 2018-03-02 MED ORDER — PANTOPRAZOLE SODIUM 40 MG PO TBEC: 40.0000 mg | DELAYED_RELEASE_TABLET | Freq: Two times a day (BID) | ORAL | 5 refills | Status: DC

## 2018-03-02 MED ORDER — CIPROFLOXACIN HCL 500 MG PO TABS
500.0000 mg | ORAL_TABLET | Freq: Two times a day (BID) | ORAL | Status: DC
  Administered 2018-03-02 – 2018-03-03 (×3): 500 mg via ORAL
  Filled 2018-03-02 (×5): qty 1

## 2018-03-02 MED ORDER — NITROGLYCERIN 0.4 MG SL SUBL: 0.4000 mg | SUBLINGUAL_TABLET | SUBLINGUAL | Status: DC | PRN

## 2018-03-02 MED ORDER — PANTOPRAZOLE SODIUM 40 MG PO TBEC
40.0000 mg | DELAYED_RELEASE_TABLET | Freq: Two times a day (BID) | ORAL | Status: DC
  Administered 2018-03-02 – 2018-03-04 (×4): 40 mg via ORAL
  Filled 2018-03-02 (×4): qty 1

## 2018-03-02 MED ORDER — LEVOTHYROXINE SODIUM 50 MCG PO TABS
25.0000 ug | ORAL_TABLET | Freq: Every day | ORAL | Status: DC
  Administered 2018-03-03 – 2018-03-04 (×2): 25 ug via ORAL
  Filled 2018-03-02 (×2): qty 1

## 2018-03-02 MED ORDER — VITAMIN B-12 1000 MCG PO TABS
1000.0000 ug | ORAL_TABLET | Freq: Every day | ORAL | Status: DC
  Administered 2018-03-02 – 2018-03-04 (×3): 1000 ug via ORAL
  Filled 2018-03-02 (×3): qty 1

## 2018-03-02 MED ORDER — MAGNESIUM HYDROXIDE 400 MG/5ML PO SUSP: 30.0000 mL | Freq: Every day | ORAL | Status: DC | PRN

## 2018-03-02 MED ORDER — AMLODIPINE BESYLATE 5 MG PO TABS
5.0000 mg | ORAL_TABLET | Freq: Every day | ORAL | Status: DC
  Administered 2018-03-03 – 2018-03-04 (×2): 5 mg via ORAL
  Filled 2018-03-02 (×2): qty 1

## 2018-03-02 MED ORDER — OLANZAPINE 5 MG PO TABS
5.0000 mg | ORAL_TABLET | Freq: Every day | ORAL | Status: DC
  Administered 2018-03-02: 5 mg via ORAL
  Filled 2018-03-02: qty 1

## 2018-03-02 MED ORDER — PROMETHAZINE HCL 25 MG PO TABS: 25.0000 mg | ORAL_TABLET | Freq: Four times a day (QID) | ORAL | 3 refills | Status: DC | PRN

## 2018-03-02 MED ORDER — PYRIDOSTIGMINE BROMIDE 60 MG PO TABS
60.0000 mg | ORAL_TABLET | Freq: Three times a day (TID) | ORAL | Status: DC
  Administered 2018-03-03 – 2018-03-04 (×5): 60 mg via ORAL
  Filled 2018-03-02 (×6): qty 1

## 2018-03-02 MED ORDER — ASPIRIN 81 MG PO CHEW
325.0000 mg | CHEWABLE_TABLET | Freq: Every day | ORAL | Status: DC
  Administered 2018-03-03 – 2018-03-04 (×2): 325 mg via ORAL
  Filled 2018-03-02 (×2): qty 5

## 2018-03-02 MED ORDER — LOSARTAN POTASSIUM 25 MG PO TABS
12.5000 mg | ORAL_TABLET | Freq: Every day | ORAL | Status: DC
  Administered 2018-03-03 – 2018-03-04 (×2): 12.5 mg via ORAL
  Filled 2018-03-02 (×2): qty 0.5

## 2018-03-02 NOTE — ED Notes (Signed)
Pt asked if she needed to get up and use the restroom since she hasn't been up this morning.  Pt shook her head no.  Pt also asked if she would like to take a shower and she said no.

## 2018-03-02 NOTE — ED Notes (Signed)
Hourly rounding reveals patient in room. No complaints, stable, in no acute distress. Q15 minute rounds and monitoring via Security Cameras to continue. 

## 2018-03-02 NOTE — BH Assessment (Signed)
Patient is to be admitted to [facility] by Dr. Leverne Humbles.  Attending Physician will be Dr. Bary Leriche.   Patient has been assigned to room 305, by [place].   ER staff is aware of the admission:  Dr. Jimmye Norman, ER MD   Berton Lan, Patient's Nurse   Santiago Glad, Patient Access.

## 2018-03-02 NOTE — ED Notes (Signed)
Breakfast tray placed in pt room. Pt sleeping 

## 2018-03-02 NOTE — Tx Team (Signed)
Initial Treatment Plan 03/02/2018 5:10 PM [place] LXB:[id number]    PATIENT STRESSORS: Financial difficulties Health problems   PATIENT STRENGTHS: Ability for insight General fund of knowledge Supportive family/friends   PATIENT IDENTIFIED PROBLEMS: "anxiety"  "depression"  "sleep"                 DISCHARGE CRITERIA:  Improved stabilization in mood, thinking, and/or behavior Motivation to continue treatment in a less acute level of care  PRELIMINARY DISCHARGE PLAN: Return to previous living arrangement  PATIENT/FAMILY INVOLVEMENT: This treatment plan has been presented to and reviewed with the patient, [name].  The patient  have been given the opportunity to ask questions and make suggestions.  Jolene Provost, RN 03/02/2018, 5:10 PM

## 2018-03-02 NOTE — ED Notes (Signed)
Pt urinated in bed. Pt taking shower. Linens removed, bed cleaned and floor mopped. Linens and blankets put back on bed and pt is back in her room.

## 2018-03-02 NOTE — ED Notes (Signed)
Pt ambulated to bathroom with no assistance.  

## 2018-03-02 NOTE — Progress Notes (Signed)
Patient ID: [name], female   DOB: 1970-06-01, 48 y.o.   MRN: [id number] Per State regulations 482.30 this chart was reviewed for medical necessity with respect to the patient's admission/duration of stay.    Next review date: 03/06/2018  Debarah Crape, BSN, RN-BC  Case Manager

## 2018-03-02 NOTE — ED Notes (Signed)
Pt denies SI, HI, and A/V hallucinations.  Contracts for safety.  Transferred to lower level BMU in officer's custody with 2:2 bags and a blue cane.

## 2018-03-02 NOTE — Progress Notes (Addendum)
Hypoglycemic Event  CBG: 39  Treatment: 8 oz. Juice   Symptoms: None  Follow-up CBG: Time:1955pm CBG Result:143  Possible Reasons for Event: Inadequate meal intake and Medication regimen: blood sugar medications  Comments/MD notified: Dr. Bary Leriche notified of patient presentation at 1958pm, MD changing patient orders. Charge RN notified at 1955pm. Pt. Received in report from day shift at 1930pm and hypoglycemia protocol initiated by this writer immediately. Pt. Assessed and not symptomatic and also given 8 oz. Juice per protocol. Blood sugar rechecked and WDL. Will give patient additional food and juice for snack to prevent additional incident of hypoglycemia. Will continue to monitor per protocols.      [name]

## 2018-03-02 NOTE — BH Assessment (Signed)
Assessment Note  [name] is an 48 y.o. female who presents to the ER due to having a lack of sleep and having visual hallucinations. Per the report giving to the ER staff, the patient have gone several days without any sleep and family was having concerns. Patient have a history of Bipolar.  During the interview, patient answered all the questions with "nothing." She provided her name and date of birth and that was all. The questions that needed specific answers such as who brought her to the ER, she would reply by saying nothing. The information for this consult was gathered from notes from the medical record and from other ER staff.  Diagnosis: Bipolar  Past Medical History:  Past Medical History:  Diagnosis Date  . Anemia    iron deficiency anemia  . Aortic arch aneurysm ([place])   . Bipolar disorder ([place])   . BRCA negative 2014  . CAD (coronary artery disease)    a. 08/2003 Cath: LAD 30-40-med Rx; b. 11/2014 PCI: LAD 2m(3.25x23 Xience Alpine DES); c. 06/2015 PCI: D1 (2.25x12 Resolute Integrity DES); d. 06/2017 PCI: Patent mLAD stent, D2 95 (PTCA); e. 09/2017 PCI: D2 99ost (CBA); d. 12/2017 Cath: LM nl, LAD 319m80d (small), D1 40ost, D2 95ost, LCX 40p, RCA 40ost/p->Med rx for D2 given restenosis.  . CKD (chronic kidney disease), stage III ([place]  . Colon polyp   . CVA (cerebral vascular accident) ([place]   Left side weakness.   . Diabetes ([place]  . Family history of breast cancer    BRCA neg 2014  . Gastric ulcer 04/27/2011  . History of echocardiogram    a. 03/2017 Echo: EF 60-65%, no rwma; b. 02/2018 Echo: EF 60-65%, no rwma. Nl RV fxn. No cardiac source of emboli (admitted w/ stroke).  . [place] (hypertension)   . Hyperlipemia   . Hypothyroid   . Malignant melanoma of skin of scalp ([place]  . MI, acute, non ST segment elevation ([facility]  . Neuromuscular disorder ([facility]  . Orthostatic hypotension   . S/P drug eluting coronary stent placement 06/04/2015  . Sepsis ([facility]/14/2019   . [facility]   a. 02/2018 MRI: 59m10mate acute/early subacute L medial frontal lobe inarct; b. 02/2018 MRA No large vessel occlusion or aneurysm. Mod to sev L P2 stenosis. thready L vertebral artery, diffusely dzs'd; c. 02/2018 Carotid U/S: <50% bilat ICA dzs.    Past Surgical History:  Procedure Laterality Date  . APPENDECTOMY    . CARDIAC CATHETERIZATION N/A 11/09/2014   Procedure: Coronary Angiography;  Surgeon: TimMinna MerrittsD;  [facility];  Service: Cardiovascular;  Laterality: N/A;  . CARDIAC CATHETERIZATION N/A 11/12/2014   Procedure: Coronary Stent Intervention;  Surgeon: AleIsaias CowmanD;  [facility];  Service: Cardiovascular;  Laterality: N/A;  . CARDIAC CATHETERIZATION N/A 04/18/2015   Procedure: Left Heart Cath and Coronary Angiography;  Surgeon: TimMinna MerrittsD;  [facility];  Service: Cardiovascular;  Laterality: N/A;  . CARDIAC CATHETERIZATION Left 06/04/2015   Procedure: Left Heart Cath and Coronary Angiography;  Surgeon: MuhWellington HampshireD;  [facility];  Service: Cardiovascular;  Laterality: Left;  . CARDIAC CATHETERIZATION N/A 06/04/2015   Procedure: Coronary Stent Intervention;  Surgeon: MuhWellington HampshireD;  [facility];  Service: Cardiovascular;  Laterality: N/A;  . CESAREAN SECTION  2001  . CHOLECYSTECTOMY N/A 11/18/2016  Procedure: LAPAROSCOPIC CHOLECYSTECTOMY WITH INTRAOPERATIVE CHOLANGIOGRAM;  Surgeon: Christene Lye, MD;  [facility];  Service: General;  Laterality: N/A;  . COLONOSCOPY WITH PROPOFOL N/A 04/27/2016   Procedure: COLONOSCOPY WITH PROPOFOL;  Surgeon: [name], MD;  [place];  Service: Endoscopy;  Laterality: N/A;  . COLONOSCOPY WITH PROPOFOL N/A 01/12/2018   Procedure: COLONOSCOPY WITH PROPOFOL;  Surgeon: Toledo, Benay Pike, MD;  [facility];  Service: Endoscopy;  Laterality: N/A;  . CORONARY  ANGIOPLASTY    . CORONARY BALLOON ANGIOPLASTY N/A 06/29/2017   Procedure: CORONARY BALLOON ANGIOPLASTY;  Surgeon: Wellington Hampshire, MD;  Location: [place] [facility] LAB;  Service: Cardiovascular;  Laterality: N/A;  . CORONARY BALLOON ANGIOPLASTY N/A 09/20/2017   Procedure: CORONARY BALLOON ANGIOPLASTY;  Surgeon: Wellington Hampshire, MD;  Location: [place] [facility] LAB;  Service: Cardiovascular;  Laterality: N/A;  . DILATION AND CURETTAGE OF UTERUS    . ESOPHAGOGASTRODUODENOSCOPY (EGD) WITH PROPOFOL N/A 09/14/2014   Procedure: ESOPHAGOGASTRODUODENOSCOPY (EGD) WITH PROPOFOL;  Surgeon: Josefine Class, MD;  [facility];  Service: Endoscopy;  Laterality: N/A;  . ESOPHAGOGASTRODUODENOSCOPY (EGD) WITH PROPOFOL N/A 04/27/2016   Procedure: ESOPHAGOGASTRODUODENOSCOPY (EGD) WITH PROPOFOL;  Surgeon: [name], MD;  Location: [place];  Service: Endoscopy;  Laterality: N/A;  Diabetic - oral meds  . ESOPHAGOGASTRODUODENOSCOPY (EGD) WITH PROPOFOL N/A 01/12/2018   Procedure: ESOPHAGOGASTRODUODENOSCOPY (EGD) WITH PROPOFOL;  Surgeon: Toledo, Benay Pike, MD;  [facility];  Service: Endoscopy;  Laterality: N/A;  . GASTRIC BYPASS  09/2009   [facility]   . Left Carotid to sublcavian artery bypass w/ subclavian artery ligation     a. Performed @ [name].  . LEFT HEART CATH AND CORONARY ANGIOGRAPHY Left 06/29/2017   Procedure: LEFT HEART CATH AND CORONARY ANGIOGRAPHY;  Surgeon: Wellington Hampshire, MD;  Location: [place] CV LAB;  Service: Cardiovascular;  Laterality: Left;  . LEFT HEART CATH AND CORONARY ANGIOGRAPHY N/A 09/20/2017   Procedure: LEFT HEART CATH AND CORONARY ANGIOGRAPHY;  Surgeon: Wellington Hampshire, MD;  Location: [place] CV LAB;  Service: Cardiovascular;  Laterality: N/A;  . LEFT HEART CATH AND CORONARY ANGIOGRAPHY N/A 12/20/2017   Procedure: LEFT HEART CATH AND CORONARY ANGIOGRAPHY;  Surgeon: Wellington Hampshire, MD;  Location: [place] CV  LAB;  Service: Cardiovascular;  Laterality: N/A;  . MELANOMA EXCISION  2016   Dr. Evorn Gong  . [place]  2002  . RIGHT OOPHORECTOMY    . SHOULDER ARTHROSCOPY WITH OPEN ROTATOR CUFF REPAIR Right 01/07/2016   Procedure: SHOULDER ARTHROSCOPY WITH DEBRIDMENT, SUBACHROMIAL DECOMPRESSION;  Surgeon: Corky Mull, MD;  [facility];  Service: Orthopedics;  Laterality: Right;  . SHOULDER ARTHROSCOPY WITH OPEN ROTATOR CUFF REPAIR Right 03/16/2017   Procedure: SHOULDER ARTHROSCOPY WITH OPEN ROTATOR CUFF REPAIR POSSIBLE BICEPS TENODESIS;  Surgeon: Corky Mull, MD;  [facility];  Service: Orthopedics;  Laterality: Right;  . TRIGGER FINGER RELEASE Right     [place]    Family History:  Family History  Problem Relation Age of Onset  . Hypertension Mother   . Anxiety disorder Mother   . Depression Mother   . Bipolar disorder Mother   . Heart disease Mother        No details  . Hyperlipidemia Mother   . Kidney disease Father   . Heart disease Father 23  . Hypertension Father   . Diabetes Father   . Stroke Father   . Colon cancer Father  dx in his 64's  . Anxiety disorder Father   . Depression Father   . Skin cancer Father   . Kidney disease Sister   . Thyroid nodules Sister   . Hypertension Sister   . Hypertension Sister   . Diabetes Sister   . Hyperlipidemia Sister   . Depression Sister   . Breast cancer Maternal Aunt 36  . Breast cancer Maternal Aunt 80  . Ovarian cancer Cousin   . Colon cancer Cousin   . Breast cancer Other   . Kidney cancer Neg Hx   . Bladder Cancer Neg Hx     Social History:  reports that she quit smoking about 23 years ago. Her smoking use included cigarettes. She has never used smokeless tobacco. She reports that she does not drink alcohol or use drugs.  Additional Social History:  Alcohol / Drug Use Pain Medications: see PTA meds Prescriptions: see PTA meds Over the Counter: see PTA meds History of alcohol / drug use?: No  history of alcohol / drug abuse Longest period of sobriety (when/how long): Reports of none Negative Consequences of Use: (n/a) Withdrawal Symptoms: (n/a)  CIWA: CIWA-Ar BP: (!) 118/53 Pulse Rate: (!) 58 COWS:    Allergies:  Allergies  Allergen Reactions  . Lipitor [Atorvastatin] Other (See Comments)    Leg pains  . Tramadol Other (See Comments)    Mouth feels like it's on fire    Home Medications: (Not in a hospital admission)   OB/GYN Status:  No LMP recorded. Patient has had an ablation.  General Assessment Data Location of Assessment: [facility] ED TTS Assessment: In system Is this a Tele or Face-to-Face Assessment?: Face-to-Face Is this an Initial Assessment or a Re-assessment for this encounter?: Initial Assessment Language Other than English: No Living Arrangements: Other (Comment) What gender do you identify as?: Female Marital status: Single Pregnancy Status: No Living Arrangements: Children Can pt return to current living arrangement?: Yes Admission Status: Involuntary Petitioner: ED Attending Is patient capable of signing voluntary admission?: No(Under IVC) Referral Source: Self/Family/Friend Insurance type: MCR A&B  Medical Screening Exam [facility] Walk-in ONLY) Medical Exam completed: Yes  Crisis Care Plan Living Arrangements: Children Legal Guardian: Other:(Self) Name of Psychiatrist: Reports of none Name of Therapist: Reports of none  Education Status Is patient currently in school?: No Is the patient employed, unemployed or receiving disability?: Unemployed  Risk to self with the past 6 months Suicidal Ideation: No Has patient been a risk to self within the past 6 months prior to admission? : No Suicidal Intent: No Has patient had any suicidal intent within the past 6 months prior to admission? : No Is patient at risk for suicide?: No Suicidal Plan?: No Has patient had any suicidal plan within the past 6 months prior to admission? : No Access to  Means: No What has been your use of drugs/alcohol within the last 12 months?: Reports of none Previous Attempts/Gestures: No How many times?: 0 Other Self Harm Risks: Reports of none Triggers for Past Attempts: None known Intentional Self Injurious Behavior: None Family Suicide History: Unknown Recent stressful life event(s): Other (Comment) Persecutory voices/beliefs?: No Depression: Yes Depression Symptoms: Insomnia, Isolating Substance abuse history and/or treatment for substance abuse?: No Suicide prevention information given to non-admitted patients: Not applicable  Risk to Others within the past 6 months Homicidal Ideation: No Does patient have any lifetime risk of violence toward others beyond the six months prior to admission? : No Thoughts of Harm to Others: No Current  Homicidal Intent: No Current Homicidal Plan: No Access to Homicidal Means: No Identified Victim: Reports of none History of harm to others?: No Assessment of Violence: None Noted Violent Behavior Description: Reports of none Does patient have access to weapons?: No Criminal Charges Pending?: No Does patient have a court date: No Is patient on probation?: No  Psychosis Hallucinations: None noted Delusions: None noted  Mental Status Report Appearance/Hygiene: Unremarkable, In scrubs Eye Contact: Poor Motor Activity: Freedom of movement, Unremarkable Speech: Incoherent, Soft Level of Consciousness: Alert Mood: Pleasant, Empty Affect: Flat Anxiety Level: None Thought Processes: Thought Blocking, Irrelevant Judgement: Impaired Orientation: Person, Place Obsessive Compulsive Thoughts/Behaviors: Moderate  Cognitive Functioning Concentration: Decreased Memory: Recent Impaired, Remote Impaired Is patient IDD: No Insight: Poor Impulse Control: Fair Appetite: Good Have you had any weight changes? : No Change Sleep: Decreased Total Hours of Sleep: 0(Per family) Vegetative Symptoms:  None  ADLScreening [facility] Assessment Services) Patient's cognitive ability adequate to safely complete daily activities?: Yes Patient able to express need for assistance with ADLs?: Yes Independently performs ADLs?: Yes (appropriate for developmental age)  Prior Inpatient Therapy Prior Inpatient Therapy: Yes Prior Therapy Dates: 0/2017 Prior Therapy Facilty/Provider(s): [facility] Reason for Treatment: Depression  Prior Outpatient Therapy Prior Outpatient Therapy: Yes Prior Therapy Dates: 2017 Prior Therapy Facilty/Provider(s): [facility] Reason for Treatment: Bipolar Does patient have an ACCT team?: No Does patient have Intensive In-House Services?  : No Does patient have [facility] services? : No Does patient have P4CC services?: No  ADL Screening (condition at time of admission) Patient's cognitive ability adequate to safely complete daily activities?: Yes Is the patient deaf or have difficulty hearing?: No Does the patient have difficulty seeing, even when wearing glasses/contacts?: No Does the patient have difficulty concentrating, remembering, or making decisions?: No Patient able to express need for assistance with ADLs?: Yes Does the patient have difficulty dressing or bathing?: No Independently performs ADLs?: Yes (appropriate for developmental age) Does the patient have difficulty walking or climbing stairs?: No Weakness of Legs: None Weakness of Arms/Hands: None  Home Assistive Devices/Equipment Home Assistive Devices/Equipment: None  Therapy Consults (therapy consults require a physician order) PT Evaluation Needed: No OT Evalulation Needed: No SLP Evaluation Needed: No Abuse/Neglect Assessment (Assessment to be complete while patient is alone) Abuse/Neglect Assessment Can Be Completed: Yes Physical Abuse: Denies Verbal Abuse: Denies Sexual Abuse: Denies Exploitation of patient/patient's resources: Denies Self-Neglect: Denies Values /  Beliefs Cultural Requests During Hospitalization: None Spiritual Requests During Hospitalization: None Consults Spiritual Care Consult Needed: No Social Work Consult Needed: No Regulatory affairs officer (For Healthcare) Does Patient Have a Medical Advance Directive?: No Would patient like information on creating a medical advance directive?: No - Patient declined       Child/Adolescent Assessment Running Away Risk: Denies(Patient is an adult)  Disposition:  Disposition Initial Assessment Completed for this Encounter: Yes  On Site Evaluation by:   Reviewed with Physician:    Gunnar Fusi MS, LCAS, LPC, [place], CCSI Therapeutic Triage Specialist 03/02/2018 1:25 PM

## 2018-03-02 NOTE — Progress Notes (Signed)
[facility] MD Progress Note  03/02/2018 11:09 AM [place]  MRN:  [id number] Subjective:  "I have not slept in over 3 days, and I was seeing and talking to dead people." Principal Problem: Major depressive disorder, recurrent, severe with psychotic features ([place]) Diagnosis: Principal Problem:   Major depressive disorder, recurrent, severe with psychotic features ([place]) Active Problems:   Essential hypertension   Stroke ([place])   Neuropathy ([place])   Hyperlipidemia   GERD (gastroesophageal reflux disease)   Incomplete bladder emptying   Malignant melanoma (HCC)   Chronic systolic CHF (congestive heart failure) ([place])   Adult hypothyroidism   Type 2 diabetes mellitus with stage 3 chronic kidney disease, without long-term current use of insulin ([place])   CKD (chronic kidney disease), stage III ([place])   H/O gastric bypass  Total Time spent with patient: 40 min   HPI: [name] is a 48 y.o. female with a known history of anemia, aortic arch aneurysm, bipolar disorder, coronary artery disease and multiple catheterizations and stent placement and suggested to her medical management a few months ago, chronic kidney disease stage III, colon polyp, cerebrovascular accident with left-sided weakness, diabetes, familial breast cancer, hypertension, hyperlipidemia, hypothyroidism, melanoma and history of gastric bypass surgery. She presented to the ED on 03/01/2018 history for hallucinations.  States that she is seeing cartoon characters as well as dead people and also hearing auditory hallucinations.  States that she has not slept in several days.  Denies any medication changes.  Denies any thoughts of hurting herself or others.  Brought him the right mentation of her son as the patient has been acting "crazy."  Past Psychiatric History: Depression, severe, recurrent. Patient is treated at for depression by her primary care provider, Dr. Caryn Section with Celexa and Wellbutrin.  Patient does not feel like  these medications have been effective.  On evaluation this morning, patient is calm and cooperative.  She reports that she slept well overnight while in the hospital and "feels much better."  She states that "my son tricked me very bring me to the hospital last night.  I think he was worried because I was telling him about my dreams, and how I have been seeing and talking to dead people."  Patient admits that she had not slept in over 3 days.  She denies a history of bipolar disorder.  She denies any history of mania or hypomania past or current.  She states "I felt worn out, but I could not sleep."  Patient endorses having decreased appetite poor concentration and decreased energy.  Patient denies any auditory or visual hallucinations this morning.  She is denying suicidal and homicidal ideation.  She is agreeable to inpatient admission for medication management of her depression.  Of note, patient has a history of gastric bypass surgery and may need adjustments in medication for improved absorption.  SOC expressed concern over possibility of metastasis of malignant melanoma.  I have reviewed MRI of brain completed on 02/17/2018 which was negative for any malignancy.  There are findings of chronic microvascular damage in old stroke.  Past Medical History:  Past Medical History:  Diagnosis Date  . Anemia    iron deficiency anemia  . Aortic arch aneurysm ([place])   . Bipolar disorder ([place])   . BRCA negative 2014  . CAD (coronary artery disease)    a. 08/2003 Cath: LAD 30-40-med Rx; b. 11/2014 PCI: LAD 72m(3.25x23 Xience Alpine DES); c. 06/2015 PCI: D1 ((8.58I50Resolute Integrity DES);  d. 06/2017 PCI: Patent mLAD stent, D2 95 (PTCA); e. 09/2017 PCI: D2 99ost (CBA); d. 12/2017 Cath: LM nl, LAD 36m 80d (small), D1 40ost, D2 95ost, LCX 40p, RCA 40ost/p->Med rx for D2 given restenosis.  . CKD (chronic kidney disease), stage III ([place]   . Colon polyp   . CVA (cerebral vascular accident) ([place]    Left side  weakness.   . Diabetes ([place]   . Family history of breast cancer    BRCA neg 2014  . Gastric ulcer 04/27/2011  . History of echocardiogram    a. 03/2017 Echo: EF 60-65%, no rwma; b. 02/2018 Echo: EF 60-65%, no rwma. Nl RV fxn. No cardiac source of emboli (admitted w/ stroke).  .Marland KitchenHTN (hypertension)   . Hyperlipemia   . Hypothyroid   . Malignant melanoma of skin of scalp ([place]   . MI, acute, non ST segment elevation ([place]   . Neuromuscular disorder ([place]   . Orthostatic hypotension   . S/P drug eluting coronary stent placement 06/04/2015  . Sepsis ([place] 03/18/2017  . Stroke ([facility]    a. 02/2018 MRI: 779mlate acute/early subacute L medial frontal lobe inarct; b. 02/2018 MRA No large vessel occlusion or aneurysm. Mod to sev L P2 stenosis. thready L vertebral artery, diffusely dzs'd; c. 02/2018 Carotid U/S: <50% bilat ICA dzs.    Past Surgical History:  Procedure Laterality Date  . APPENDECTOMY    . CARDIAC CATHETERIZATION N/A 11/09/2014   Procedure: Coronary Angiography;  Surgeon: TiMinna MerrittsMD;  [place] [facility];  Service: Cardiovascular;  Laterality: N/A;  . CARDIAC CATHETERIZATION N/A 11/12/2014   Procedure: Coronary Stent Intervention;  Surgeon: AlIsaias CowmanMD;  [place];  Service: Cardiovascular;  Laterality: N/A;  . CARDIAC CATHETERIZATION N/A 04/18/2015   Procedure: Left Heart Cath and Coronary Angiography;  Surgeon: TiMinna MerrittsMD;  [place];  Service: Cardiovascular;  Laterality: N/A;  . CARDIAC CATHETERIZATION Left 06/04/2015   Procedure: Left Heart Cath and Coronary Angiography;  Surgeon: MuWellington HampshireMD;  [place];  Service: Cardiovascular;  Laterality: Left;  . CARDIAC CATHETERIZATION N/A 06/04/2015   Procedure: Coronary Stent Intervention;  Surgeon: MuWellington HampshireMD;  [place];  Service: Cardiovascular;  Laterality: N/A;  . CESAREAN SECTION  2001  .  CHOLECYSTECTOMY N/A 11/18/2016   Procedure: LAPAROSCOPIC CHOLECYSTECTOMY WITH INTRAOPERATIVE CHOLANGIOGRAM;  Surgeon: SaChristene LyeMD;  [facility];  Service: General;  Laterality: N/A;  . COLONOSCOPY WITH PROPOFOL N/A 04/27/2016   Procedure: COLONOSCOPY WITH PROPOFOL;  Surgeon: DaLucilla LameMD;  [place] Service: Endoscopy;  Laterality: N/A;  . COLONOSCOPY WITH PROPOFOL N/A 01/12/2018   Procedure: COLONOSCOPY WITH PROPOFOL;  Surgeon: Toledo, TeBenay PikeMD;  [facility];  Service: Endoscopy;  Laterality: N/A;  . CORONARY ANGIOPLASTY    . CORONARY BALLOON ANGIOPLASTY N/A 06/29/2017   Procedure: CORONARY BALLOON ANGIOPLASTY;  Surgeon: ArWellington HampshireMD;  [place];  Service: Cardiovascular;  Laterality: N/A;  . CORONARY BALLOON ANGIOPLASTY N/A 09/20/2017   Procedure: CORONARY BALLOON ANGIOPLASTY;  Surgeon: ArWellington HampshireMD;  [place]: [facility];  Service: Cardiovascular;  Laterality: N/A;  . DILATION AND CURETTAGE OF UTERUS    . ESOPHAGOGASTRODUODENOSCOPY (EGD) WITH PROPOFOL N/A 09/14/2014   Procedure: ESOPHAGOGASTRODUODENOSCOPY (EGD) WITH PROPOFOL;  Surgeon: MaJosefine ClassMD;  [facility];  Service: Endoscopy;  Laterality: N/A;  .  ESOPHAGOGASTRODUODENOSCOPY (EGD) WITH PROPOFOL N/A 04/27/2016   Procedure: ESOPHAGOGASTRODUODENOSCOPY (EGD) WITH PROPOFOL;  Surgeon: [name], MD;  Location: [place];  Service: Endoscopy;  Laterality: N/A;  Diabetic - oral meds  . ESOPHAGOGASTRODUODENOSCOPY (EGD) WITH PROPOFOL N/A 01/12/2018   Procedure: ESOPHAGOGASTRODUODENOSCOPY (EGD) WITH PROPOFOL;  Surgeon: Toledo, Benay Pike, MD;  [facility];  Service: Endoscopy;  Laterality: N/A;  . GASTRIC BYPASS  09/2009   [facility]   . Left Carotid to sublcavian artery bypass w/ subclavian artery ligation     a. Performed @ [facility].  . LEFT HEART CATH AND CORONARY  ANGIOGRAPHY Left 06/29/2017   Procedure: LEFT HEART CATH AND CORONARY ANGIOGRAPHY;  Surgeon: Wellington Hampshire, MD;  Location: [place] CV LAB;  Service: Cardiovascular;  Laterality: Left;  . LEFT HEART CATH AND CORONARY ANGIOGRAPHY N/A 09/20/2017   Procedure: LEFT HEART CATH AND CORONARY ANGIOGRAPHY;  Surgeon: Wellington Hampshire, MD;  Location: [place] CV LAB;  Service: Cardiovascular;  Laterality: N/A;  . LEFT HEART CATH AND CORONARY ANGIOGRAPHY N/A 12/20/2017   Procedure: LEFT HEART CATH AND CORONARY ANGIOGRAPHY;  Surgeon: Wellington Hampshire, MD;  Location: [place] CV LAB;  Service: Cardiovascular;  Laterality: N/A;  . MELANOMA EXCISION  2016   Dr. Evorn Gong  . [place]  2002  . RIGHT OOPHORECTOMY    . SHOULDER ARTHROSCOPY WITH OPEN ROTATOR CUFF REPAIR Right 01/07/2016   Procedure: SHOULDER ARTHROSCOPY WITH DEBRIDMENT, SUBACHROMIAL DECOMPRESSION;  Surgeon: Corky Mull, MD;  [facility];  Service: Orthopedics;  Laterality: Right;  . SHOULDER ARTHROSCOPY WITH OPEN ROTATOR CUFF REPAIR Right 03/16/2017   Procedure: SHOULDER ARTHROSCOPY WITH OPEN ROTATOR CUFF REPAIR POSSIBLE BICEPS TENODESIS;  Surgeon: Corky Mull, MD;  [facility];  Service: Orthopedics;  Laterality: Right;  . TRIGGER FINGER RELEASE Right     [place]   Family History:  Family History  Problem Relation Age of Onset  . Hypertension Mother   . Anxiety disorder Mother   . Depression Mother   . Bipolar disorder Mother   . Heart disease Mother        No details  . Hyperlipidemia Mother   . Kidney disease Father   . Heart disease Father 42  . Hypertension Father   . Diabetes Father   . Stroke Father   . Colon cancer Father        dx in his 19's  . Anxiety disorder Father   . Depression Father   . Skin cancer Father   . Kidney disease Sister   . Thyroid nodules Sister   . Hypertension Sister   . Hypertension Sister   . Diabetes Sister   . Hyperlipidemia Sister   . Depression  Sister   . Breast cancer Maternal Aunt 72  . Breast cancer Maternal Aunt 38  . Ovarian cancer Cousin   . Colon cancer Cousin   . Breast cancer Other   . Kidney cancer Neg Hx   . Bladder Cancer Neg Hx    Family Psychiatric  History: Patient does not state Social History:  Social History   Substance and Sexual Activity  Alcohol Use No  . Alcohol/week: 0.0 standard drinks     Social History   Substance and Sexual Activity  Drug Use No    Social History   Socioeconomic History  . Marital status: Divorced    Spouse name: Not on file  . Number of children: 1  . Years of education: Not on  file  . Highest education level: Not on file  Occupational History  . 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 Disabled through 08/02/2013 per forms 02/03/2013  Social Needs  . Financial resource strain: Not on file  . Food insecurity:    Worry: Not on file    Inability: Not on file  . Transportation needs:    Medical: Not on file    Non-medical: Not on file  Tobacco Use  . Smoking status: Former Smoker    Types: Cigarettes    Last attempt to quit: 08/31/1994    Years since quitting: 23.5  . Smokeless tobacco: Never Used  . Tobacco comment: quit 28 years ago  Substance and Sexual Activity  . Alcohol use: No    Alcohol/week: 0.0 standard drinks  . Drug use: No  . Sexual activity: Not Currently    Birth control/protection: None  Lifestyle  . Physical activity:    Days per week: Not on file    Minutes per session: Not on file  . Stress: Not on file  Relationships  . Social connections:    Talks on phone: Not on file    Gets together: Not on file    Attends religious service: Not on file    Active member of club or organization: Not on file    Attends meetings of clubs or organizations: Not on file    Relationship status: Not on file  Other Topics Concern  . 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   Lives at home with son   Additional Social History:     Lives with son Unemployed Denies substance use and alcohol use.                    Sleep: Poor  Appetite:  Decreased  Current Medications: Current Facility-Administered Medications  Medication Dose Route Frequency Provider Last Rate Last Dose  . ALPRAZolam Duanne Moron) tablet 1 mg  1 mg Oral Daily PRN Merlyn Lot, MD   1 mg at 03/01/18 2050  . amLODipine (NORVASC) tablet 5 mg  5 mg Oral Daily Merlyn Lot, MD   5 mg at 03/02/18 1009  . aspirin chewable tablet 325 mg  325 mg Oral Daily Merlyn Lot, MD   325 mg at 03/02/18 1007  . clopidogrel (PLAVIX) tablet 75 mg  75 mg Oral Q breakfast Merlyn Lot, MD   75 mg at 03/02/18 1008  . gabapentin (NEURONTIN) capsule 300 mg  300 mg Oral BID Merlyn Lot, MD   300 mg at 03/02/18 1007  . levothyroxine (SYNTHROID, LEVOTHROID) tablet 25 mcg  25 mcg Oral QAC breakfast Merlyn Lot, MD   25 mcg at 03/02/18 1008  . LORazepam (ATIVAN) tablet 2 mg  2 mg Oral Q4H PRN Merlyn Lot, MD      . losartan (COZAAR) tablet 12.5 mg  12.5 mg Oral Daily Merlyn Lot, MD   12.5 mg at 03/02/18 1008  . metoprolol succinate (TOPROL-XL) 24 hr tablet 100 mg  100 mg Oral Daily Merlyn Lot, MD   100 mg at 03/02/18 1007  . OLANZapine (ZYPREXA) tablet 5 mg  5 mg Oral QHS Merlyn Lot, MD       Current Outpatient Medications  Medication Sig Dispense Refill  . ALPRAZolam (XANAX) 1 MG tablet TAKE 1  TABLET (1 MG TOTAL) BY MOUTH DAILY AS NEEDED FOR ANXIETY. 30 tablet 5  . amLODipine (NORVASC) 5 MG tablet Take 1 tablet (5 mg total) by mouth daily. 90 tablet 6  . aspirin 81 MG chewable tablet Chew 1 tablet (81 mg total) by mouth daily. (Patient taking differently: Chew 325 mg by mouth daily. ) 30 tablet 11  . Blood Glucose Monitoring Suppl (ONE TOUCH ULTRA  2) w/Device KIT Use to check blood sugar once a day. Dx. E11.9 1 each 0  . buPROPion (WELLBUTRIN XL) 150 MG 24 hr tablet TAKE 1 TABLET BY MOUTH DAILY 30 tablet 11  . ciprofloxacin (CIPRO) 500 MG tablet Take 1 tablet (500 mg total) by mouth 2 (two) times daily for 7 days. 14 tablet 0  . citalopram (CELEXA) 40 MG tablet Take 1 tablet (40 mg total) by mouth daily. 30 tablet 5  . clopidogrel (PLAVIX) 75 MG tablet Take 1 tablet (75 mg total) by mouth daily with breakfast. 30 tablet 2  . Cyanocobalamin (VITAMIN B 12 PO) Take 1,000 mcg by mouth daily.     Marland Kitchen gabapentin (NEURONTIN) 300 MG capsule TAKE 1 CAPSULE BY MOUTH TWICE A DAY (Patient taking differently: Take 300 mg by mouth 2 (two) times daily. ) 60 capsule 5  . isosorbide mononitrate (IMDUR) 120 MG 24 hr tablet Take 1 tablet (120 mg total) by mouth daily. 90 tablet 1  . levothyroxine (SYNTHROID, LEVOTHROID) 25 MCG tablet Take 1 tablet (25 mcg total) daily before breakfast by mouth. 30 tablet 12  . losartan (COZAAR) 25 MG tablet Take 1/2 tablet (12.5 mg) by mouth once daily (Patient taking differently: Take 12.5 mg by mouth daily. ) 45 tablet 1  . metFORMIN (GLUCOPHAGE-XR) 500 MG 24 hr tablet Take 500 mg by mouth daily with breakfast.    . metoprolol succinate (TOPROL-XL) 50 MG 24 hr tablet Take 1 tablet (50 mg total) by mouth daily. Take with or immediately following a meal. (Patient taking differently: Take 100 mg by mouth daily. Take with or immediately following a meal.) 30 tablet 3  . nitroGLYCERIN (NITROSTAT) 0.4 MG SL tablet Place 1 tablet (0.4 mg total) under the tongue every 5 (five) minutes as needed for chest pain. 25 tablet 2  . pantoprazole (PROTONIX) 40 MG tablet Take 1 tablet (40 mg total) by mouth 2 (two) times daily. 60 tablet 5  . promethazine (PHENERGAN) 25 MG tablet Take 1 tablet (25 mg total) by mouth every 6 (six) hours as needed for nausea or vomiting. 30 tablet 3  . pyridostigmine (MESTINON) 60 MG tablet Take 60 mg by mouth 3  (three) times daily.     . rosuvastatin (CRESTOR) 40 MG tablet Take 1 tablet (40 mg total) by mouth daily. 90 tablet 3  . zolpidem (AMBIEN) 5 MG tablet Take 1 tablet (5 mg total) by mouth at bedtime as needed for up to 7 days for sleep. 7 tablet 0    Lab Results:  Results for orders placed or performed during the hospital encounter of 03/01/18 (from the past 48 hour(s))  Comprehensive metabolic panel     Status: Abnormal   Collection Time: 03/01/18  6:50 PM  Result Value Ref Range   Sodium 136 135 - 145 mmol/L   Potassium 4.8 3.5 - 5.1 mmol/L   Chloride 105 98 - 111 mmol/L   CO2 23 22 - 32 mmol/L   Glucose, Bld 71 70 - 99 mg/dL   BUN 48 (H) 6 - 20 mg/dL  Creatinine, Ser 2.51 (H) 0.44 - 1.00 mg/dL   Calcium 8.5 (L) 8.9 - 10.3 mg/dL   Total Protein 6.5 6.5 - 8.1 g/dL   Albumin 3.8 3.5 - 5.0 g/dL   AST 25 15 - 41 U/L   ALT 23 0 - 44 U/L   Alkaline Phosphatase 90 38 - 126 U/L   Total Bilirubin 0.5 0.3 - 1.2 mg/dL   GFR calc non Af Amer 22 (L) >60 mL/min   GFR calc Af Amer 25 (L) >60 mL/min   Anion gap 8 5 - 15    Comment: Performed at [facility], [address] [place], [place] [address]  Ethanol     Status: None   Collection Time: 03/01/18  6:50 PM  Result Value Ref Range   Alcohol, Ethyl (B) <10 <10 mg/dL    Comment: (NOTE) Lowest detectable limit for serum alcohol is 10 mg/dL. For medical purposes only. Performed at [facility], [address] [place], [place] [address]   Salicylate level     Status: None   Collection Time: 03/01/18  6:50 PM  Result Value Ref Range   Salicylate Lvl <6.8 2.8 - 30.0 mg/dL    Comment: Performed at [facility], [address] [place], [place] [address]  Acetaminophen level     Status: Abnormal   Collection Time: 03/01/18  6:50 PM  Result Value Ref Range   Acetaminophen (Tylenol), Serum <10 (L) 10 - 30 ug/mL    Comment: (NOTE) Therapeutic concentrations vary significantly. A range of 10-30 ug/mL  may be  an effective concentration for many patients. However, some  are best treated at concentrations outside of this range. Acetaminophen concentrations >150 ug/mL at 4 hours after ingestion  and >50 ug/mL at 12 hours after ingestion are often associated with  toxic reactions. Performed at [facility], [address] [place],  [address]   cbc     Status: Abnormal   Collection Time: 03/01/18  6:50 PM  Result Value Ref Range   WBC 17.2 (H) 4.0 - 10.5 K/uL   RBC 3.84 (L) 3.87 - 5.11 MIL/uL   Hemoglobin 10.0 (L) 12.0 - 15.0 g/dL   HCT 31.6 (L) 36.0 - 46.0 %   MCV 82.3 80.0 - 100.0 fL   MCH 26.0 26.0 - 34.0 pg   MCHC 31.6 30.0 - 36.0 g/dL   RDW 13.8 11.5 - 15.5 %   Platelets 231 150 - 400 K/uL   nRBC 0.0 0.0 - 0.2 %    Comment: Performed at [facility], [address], [place],  [address]  Urine Drug Screen, Qualitative     Status: Abnormal   Collection Time: 03/01/18  6:50 PM  Result Value Ref Range   Tricyclic, Ur Screen POSITIVE (A) NONE DETECTED   Amphetamines, Ur Screen NONE DETECTED NONE DETECTED   MDMA (Ecstasy)Ur Screen NONE DETECTED NONE DETECTED   Cocaine Metabolite,Ur [place] NONE DETECTED NONE DETECTED   Opiate, Ur Screen NONE DETECTED NONE DETECTED   Phencyclidine (PCP) Ur S NONE DETECTED NONE DETECTED   Cannabinoid 50 Ng, Ur [place] NONE DETECTED NONE DETECTED   Barbiturates, Ur Screen NONE DETECTED NONE DETECTED   Benzodiazepine, Ur Scrn NONE DETECTED NONE DETECTED   Methadone Scn, Ur NONE DETECTED NONE DETECTED    Comment: (NOTE) Tricyclics + metabolites, urine    Cutoff 1000 ng/mL Amphetamines + metabolites, urine  Cutoff 1000 ng/mL MDMA (Ecstasy), urine  Cutoff 500 ng/mL Cocaine Metabolite, urine          Cutoff 300 ng/mL Opiate + metabolites, urine        Cutoff 300 ng/mL Phencyclidine (PCP), urine         Cutoff 25 ng/mL Cannabinoid, urine                 Cutoff 50 ng/mL Barbiturates + metabolites, urine  Cutoff 200  ng/mL Benzodiazepine, urine              Cutoff 200 ng/mL Methadone, urine                   Cutoff 300 ng/mL The urine drug screen provides only a preliminary, unconfirmed analytical test result and should not be used for non-medical purposes. Clinical consideration and professional judgment should be applied to any positive drug screen result due to possible interfering substances. A more specific alternate chemical method must be used in order to obtain a confirmed analytical result. Gas chromatography / mass spectrometry (GC/MS) is the preferred confirmat ory method. Performed at [facility], [address] [place], [place] [address]     Blood Alcohol level:  Lab Results  Component Value Date   [facility] <10 03/01/2018   ETH <5 20/35/5974    Metabolic Disorder Labs: Lab Results  Component Value Date   HGBA1C 5.8 (H) 02/18/2018   MPG 119.76 02/18/2018   MPG 116.89 12/19/2017   Lab Results  Component Value Date   PROLACTIN 11.6 02/15/2015   Lab Results  Component Value Date   CHOL 161 02/18/2018   TRIG 103 02/18/2018   HDL 31 (L) 02/18/2018   CHOLHDL 5.2 02/18/2018   VLDL 21 02/18/2018   LDLCALC 109 (H) 02/18/2018   LDLCALC 35 09/20/2017    Physical Findings: AIMS:  , ,  ,  ,    CIWA:    COWS:     Musculoskeletal: Strength & Muscle Tone: decreased Gait & Station: normal Patient leans: N/A  Psychiatric Specialty Exam: Physical Exam  ROS  Blood pressure (!) 118/53, pulse (!) 58, temperature 98.2 F (36.8 C), temperature source Oral, resp. rate 20, height _0  (1.575 m), weight 81.2 kg, SpO2 99 %.Body mass index is 32.74 kg/m.  General Appearance: Disheveled and Guarded  Eye Contact:  Fair  Speech:  Clear and Coherent and Normal Rate  Volume:  Decreased  Mood:  Dysphoric  Affect:  Congruent  Thought Process:  Goal Directed and Descriptions of Associations: Intact  Orientation:  Full (Time, Place, and Person)  Thought Content:  Logical and  Hallucinations: Denies this morning.  Suicidal Thoughts:  Denies  Homicidal Thoughts:  Denies  Memory:  fair  Judgement:  Fair  Insight:  Fair  Psychomotor Activity:  Normal  Concentration:  Concentration: Fair  Recall:  Good  Fund of Knowledge:  Fair  Language:  Fair  Akathisia:  No  Handed:  Right  AIMS (if indicated):   n/a  Assets:  Desire for Improvement Housing Social Support  ADL's:  Intact  Cognition:  WNL  Sleep:   poor     Treatment Plan Summary: Daily contact with patient to assess and evaluate symptoms and progress in treatment, Medication management and Labs reviewed.  Hemoglobin A1c and lipid profile completed by primary care provider February 18, 2018.  Defer medication management to primary psychiatric treatment team.  Continue involuntary commitment.  Admit to inpatient psychiatry.  Lavella Hammock, MD 03/02/2018, 11:09 AM

## 2018-03-02 NOTE — Progress Notes (Signed)
Patient ID: [name], female   DOB: [birth year], 48 y.o.   MRN: [id number]   Admission Note:   D: 48 yr female who presents IVC in no acute distress for the treatment of AV hallucinations and several days without sleeping. Pt appears flat and depressed. Pt was calm and cooperative with admission process. Pt  denies SI/HI and A/V hallucinations on admission to unit. Patient states "I was wanting to sleep and took my medication and I took more to sleep." Patient denies attempting to harm herself. Past medical Hx of cancer, CAD, Diabetes, GERD, Hypertension, Hypothyroidism, Kidney Disease, Malignant Melanoma and CVA.  A:  Skin was assessed and found to be clear of any abnormal marks. PT searched and no contraband found, POC and unit policies explained and understanding verbalized. Consents obtained. Food and fluids offered, and accepted. Pt had no additional questions or concerns. Patient oriented to unit and room.  R:Patient with Q 15 minute checks in progress and patient remains safe on unit. Monitoring continues.

## 2018-03-02 NOTE — ED Notes (Signed)
Pt taking a shower at this time 

## 2018-03-02 NOTE — BH Assessment (Signed)
Writer attempted to complete TTS Consult but patient wasn't answering the questions.  Writer attempted to get collateral information from patient's son [phone]) but was unable to reach anyone.

## 2018-03-02 NOTE — Progress Notes (Signed)
Spoke with radiology this evening in regards to MD orders for speech pathology and imaging to be completed for the patient. Radiology reporting this will need to be done in the day shift tomorrow for speech pathology availability and considering the order is routine. MD notified of this report from radiology. MD changing order for tomorrow morning.

## 2018-03-03 ENCOUNTER — Other Ambulatory Visit: Payer: Self-pay

## 2018-03-03 ENCOUNTER — Telehealth: Payer: Self-pay | Admitting: Family Medicine

## 2018-03-03 DIAGNOSIS — R410 Disorientation, unspecified: Secondary | ICD-10-CM

## 2018-03-03 LAB — BASIC METABOLIC PANEL
Anion gap: 7 (ref 5–15)
BUN: 41 mg/dL — ABNORMAL HIGH (ref 6–20)
CO2: 20 mmol/L — ABNORMAL LOW (ref 22–32)
Calcium: 8.6 mg/dL — ABNORMAL LOW (ref 8.9–10.3)
Chloride: 109 mmol/L (ref 98–111)
Creatinine, Ser: 2.45 mg/dL — ABNORMAL HIGH (ref 0.44–1.00)
GFR calc Af Amer: 26 mL/min — ABNORMAL LOW (ref >60)
GFR calc non Af Amer: 22 mL/min — ABNORMAL LOW (ref >60)
Glucose, Bld: 346 mg/dL — ABNORMAL HIGH (ref 70–99)
Potassium: 4.7 mmol/L (ref 3.5–5.1)
SODIUM: 136 mmol/L (ref 135–145)

## 2018-03-03 LAB — CBC
HCT: 29.5 % — ABNORMAL LOW (ref 36.0–46.0)
HEMOGLOBIN: 9 g/dL — AB (ref 12.0–15.0)
MCH: 26 pg (ref 26.0–34.0)
MCHC: 30.5 g/dL (ref 30.0–36.0)
MCV: 85.3 fL (ref 80.0–100.0)
Platelets: 200 10*3/uL (ref 150–400)
RBC: 3.46 MIL/uL — ABNORMAL LOW (ref 3.87–5.11)
RDW: 14.1 % (ref 11.5–15.5)
WBC: 11.2 10*3/uL — ABNORMAL HIGH (ref 4.0–10.5)
nRBC: 0 % (ref 0.0–0.2)

## 2018-03-03 LAB — GLUCOSE, CAPILLARY
GLUCOSE-CAPILLARY: 39 mg/dL — AB (ref 70–99)
GLUCOSE-CAPILLARY: 19 mg/dL — AB (ref 70–99)
GLUCOSE-CAPILLARY: 49 mg/dL — AB (ref 70–99)
GLUCOSE-CAPILLARY: 59 mg/dL — AB (ref 70–99)
Glucose-Capillary: 143 mg/dL — ABNORMAL HIGH (ref 70–99)
Glucose-Capillary: 39 mg/dL — CL (ref 70–99)
Glucose-Capillary: 119 mg/dL — ABNORMAL HIGH (ref 70–99)
Glucose-Capillary: 30 mg/dL — CL (ref 70–99)
Glucose-Capillary: 87 mg/dL (ref 70–99)

## 2018-03-03 LAB — GLUCOSE, RANDOM: Glucose, Bld: 57 mg/dL — ABNORMAL LOW (ref 70–99)

## 2018-03-03 MED ORDER — BUPROPION HCL ER (XL) 150 MG PO TB24
150.0000 mg | ORAL_TABLET | Freq: Every day | ORAL | Status: DC
  Administered 2018-03-03 – 2018-03-04 (×2): 150 mg via ORAL
  Filled 2018-03-03 (×2): qty 1

## 2018-03-03 MED ORDER — ENSURE MAX PROTEIN PO LIQD
11.0000 [oz_av] | Freq: Two times a day (BID) | ORAL | Status: DC
  Administered 2018-03-04: 11 [oz_av] via ORAL
  Filled 2018-03-03: qty 330

## 2018-03-03 MED ORDER — ALPRAZOLAM 1 MG PO TABS
1.0000 mg | ORAL_TABLET | Freq: Every day | ORAL | Status: DC
  Administered 2018-03-03: 1 mg via ORAL
  Filled 2018-03-03: qty 1

## 2018-03-03 MED ORDER — GLUCAGON HCL RDNA (DIAGNOSTIC) 1 MG IJ SOLR: 1.0000 mg | Freq: Once | INTRAMUSCULAR | Status: DC | PRN

## 2018-03-03 MED ORDER — ADULT MULTIVITAMIN W/MINERALS CH
1.0000 | ORAL_TABLET | Freq: Every day | ORAL | Status: DC
  Administered 2018-03-04: 1 via ORAL
  Filled 2018-03-03: qty 1

## 2018-03-03 MED ORDER — TRAZODONE HCL 100 MG PO TABS
100.0000 mg | ORAL_TABLET | Freq: Every day | ORAL | Status: DC
  Administered 2018-03-03: 100 mg via ORAL
  Filled 2018-03-03: qty 1

## 2018-03-03 NOTE — Progress Notes (Signed)
Patient is alert and oriented X 4, denies SI, HI and AVH. Patient has low blood glucose levels throughout the day, Psychiatrist is aware. Patient has no hypoglycemic symptoms, as is eating small snacks throughout the day. Patient also has been eating meals during meal time today as well.

## 2018-03-03 NOTE — H&P (Signed)
Psychiatric Admission Assessment Adult  Patient Identification: [name] MRN:  [id number] Date of Evaluation:  03/03/2018 Chief Complaint: Insomnia Principal Diagnosis: Major depressive disorder, recurrent, severe with psychotic features ([place]) Diagnosis:  Principal Problem:   Major depressive disorder, recurrent, severe with psychotic features ([place]) Active Problems:   Essential hypertension   Neuropathy ([place])   Hyperlipidemia   GERD (gastroesophageal reflux disease)   Adult hypothyroidism   Multiple sclerosis ([place])   Vitamin B12 deficiency   H/O gastric bypass  History of Present Illness: This is a patient with a past history of depression who was admitted through the emergency room where she presented night before last with complaints of visual hallucinations.  Patient was complaining of seeing cartoon characters and dead people.  She was evaluated in the emergency room.  At no time did she make any suicidal statements nor was there any evidence of suicidality.  Patient was admitted to the hospital because of concern about "psychotic" symptoms.  On evaluation today the patient denies any hallucinations.  Also denies being depressed and denies any recent depression.  Denies any hopelessness or major mood changes.  Patient says that she did not sleep for 2 or 3 days straight.  On the 28th she went to see her cardiologist and by her account "begged" them for sleeping medicine.  She was given a 7-day prescription for 5 mg Ambien.  Patient says she went home and in the mid afternoon took more than 1 of the prescribed Ambien.  Evidently it was shortly after that that she began to evidence what sounds like delirium with visual hallucinations and confusion.  On closer examination the patient admits that she had run out of her Xanax at least a week previously.  She said that when she was in the hospital for her stroke they were giving it to her several times a day but when she was discharged she  had to cut back down to her usual 1 mg a day and instead she overused her prescription.  Denies any other substance abuse.  Patient has significant stress in her life from multiple medical problems including a recent stroke.  On interview today she is still very sleepy although arousable.  Finally got a good night sleep last night.  Social history: Lives with her adolescent son.  Medical history: Multiple severe chronic medical problems including severe coronary artery disease.  Has had multiple interventions.  Chronic renal insufficiency.  History of multiple sclerosis.  History of gastric bypass.  History of high blood pressure.  Recent stroke just a couple weeks ago.  Substance abuse history: Patient is on chronic prescribed benzodiazepines which from what I can tell appear to have not been a problem in the past.  Do not see any clear evidence of prior substance abuse Associated Signs/Symptoms: Depression Symptoms:  psychomotor retardation, (Hypo) Manic Symptoms:  Hallucinations, Anxiety Symptoms:  None Psychotic Symptoms:  Hallucinations: Visual PTSD Symptoms: Negative Total Time spent with patient: 1 hour  Past Psychiatric History: Patient has a prior history of depression and suicide attempt.  She was in the hospital here 3 years ago.  When she was discharged she was on Abilify and Celexa.  Patient reviewed her medicines with me and told me she does not think she takes Celexa anymore but takes Wellbutrin.  She cannot remember how long she has been getting it.  Also prescribed 1 mg of Xanax a day.  Gets her outpatient care from her primary care doctor.  No history  of bipolar disorder or mania in the past  Is the patient at risk to self? No.  Has the patient been a risk to self in the past 6 months? No.  Has the patient been a risk to self within the distant past? Yes.    Is the patient a risk to others? No.  Has the patient been a risk to others in the past 6 months? No.  Has the patient  been a risk to others within the distant past? No.   Prior Inpatient Therapy:   Prior Outpatient Therapy:    Alcohol Screening: 1. How often do you have a drink containing alcohol?: Never 2. How many drinks containing alcohol do you have on a typical day when you are drinking?: 1 or 2 3. How often do you have six or more drinks on one occasion?: Never AUDIT-C Score: 0 4. How often during the last year have you found that you were not able to stop drinking once you had started?: Never 5. How often during the last year have you failed to do what was normally expected from you becasue of drinking?: Never 6. How often during the last year have you needed a first drink in the morning to get yourself going after a heavy drinking session?: Never 7. How often during the last year have you had a feeling of guilt of remorse after drinking?: Never 8. How often during the last year have you been unable to remember what happened the night before because you had been drinking?: Never 9. Have you or someone else been injured as a result of your drinking?: No 10. Has a relative or friend or a doctor or another health worker been concerned about your drinking or suggested you cut down?: No Alcohol Use Disorder Identification Test Final Score (AUDIT): 0 Alcohol Brief Interventions/Follow-up: AUDIT Score <7 follow-up not indicated Substance Abuse History in the last 12 months:  No. Consequences of Substance Abuse: Medical Consequences:  I suspect strongly that what we are seeing is the result of alprazolam withdrawal combined with an overuse of Ambien producing a transient delirium. Previous Psychotropic Medications: Yes  Psychological Evaluations: Yes  Past Medical History:  Past Medical History:  Diagnosis Date  . Anemia    iron deficiency anemia  . Aortic arch aneurysm ([place])   . Bipolar disorder ([place])   . BRCA negative 2014  . CAD (coronary artery disease)    a. 08/2003 Cath: LAD 30-40-med Rx; b.  11/2014 PCI: LAD 82m(3.25x23 Xience Alpine DES); c. 06/2015 PCI: D1 (2.25x12 Resolute Integrity DES); d. 06/2017 PCI: Patent mLAD stent, D2 95 (PTCA); e. 09/2017 PCI: D2 99ost (CBA); d. 12/2017 Cath: LM nl, LAD 366m80d (small), D1 40ost, D2 95ost, LCX 40p, RCA 40ost/p->Med rx for D2 given restenosis.  . CKD (chronic kidney disease), stage III ([facility]  . Colon polyp   . CVA (cerebral vascular accident) ([facility]   Left side weakness.   . Diabetes ([facility]  . Family history of breast cancer    BRCA neg 2014  . Gastric ulcer 04/27/2011  . History of echocardiogram    a. 03/2017 Echo: EF 60-65%, no rwma; b. 02/2018 Echo: EF 60-65%, no rwma. Nl RV fxn. No cardiac source of emboli (admitted w/ stroke).  . [place] (hypertension)   . Hyperlipemia   . Hypothyroid   . Malignant melanoma of skin of scalp ([facility]  . MI, acute, non ST segment elevation ([facility]  . Neuromuscular disorder (  HCC)   . Orthostatic hypotension   . S/P drug eluting coronary stent placement 06/04/2015  . Sepsis ([place]) 03/18/2017  . Stroke [facility])    a. 02/2018 MRI: 49m late acute/early subacute L medial frontal lobe inarct; b. 02/2018 MRA No large vessel occlusion or aneurysm. Mod to sev L P2 stenosis. thready L vertebral artery, diffusely dzs'd; c. 02/2018 Carotid U/S: <50% bilat ICA dzs.    Past Surgical History:  Procedure Laterality Date  . APPENDECTOMY    . CARDIAC CATHETERIZATION N/A 11/09/2014   Procedure: Coronary Angiography;  Surgeon: TMinna Merritts MD;  [facility];  Service: Cardiovascular;  Laterality: N/A;  . CARDIAC CATHETERIZATION N/A 11/12/2014   Procedure: Coronary Stent Intervention;  Surgeon: AIsaias Cowman MD;  [facility];  Service: Cardiovascular;  Laterality: N/A;  . CARDIAC CATHETERIZATION N/A 04/18/2015   Procedure: Left Heart Cath and Coronary Angiography;  Surgeon: TMinna Merritts MD;  [facility];  Service: Cardiovascular;  Laterality: N/A;  . CARDIAC  CATHETERIZATION Left 06/04/2015   Procedure: Left Heart Cath and Coronary Angiography;  Surgeon: MWellington Hampshire MD;  [facility];  Service: Cardiovascular;  Laterality: Left;  . CARDIAC CATHETERIZATION N/A 06/04/2015   Procedure: Coronary Stent Intervention;  Surgeon: MWellington Hampshire MD;  Location[facility];  Service: Cardiovascular;  Laterality: N/A;  . CESAREAN SECTION  2001  . CHOLECYSTECTOMY N/A 11/18/2016   Procedure: LAPAROSCOPIC CHOLECYSTECTOMY WITH INTRAOPERATIVE CHOLANGIOGRAM;  Surgeon: SChristene Lye MD;  [facility];  Service: General;  Laterality: N/A;  . COLONOSCOPY WITH PROPOFOL N/A 04/27/2016   Procedure: COLONOSCOPY WITH PROPOFOL;  Surgeon: DLucilla Lame MD;  Location: [facility]  Service: Endoscopy;  Laterality: N/A;  . COLONOSCOPY WITH PROPOFOL N/A 01/12/2018   Procedure: COLONOSCOPY WITH PROPOFOL;  Surgeon: Toledo, TBenay Pike MD;  Location: [facility]COPY;  Service: Endoscopy;  Laterality: N/A;  . CORONARY ANGIOPLASTY    . CORONARY BALLOON ANGIOPLASTY N/A 06/29/2017   Procedure: CORONARY BALLOON ANGIOPLASTY;  Surgeon: AWellington Hampshire MD;  [facility];  Service: Cardiovascular;  Laterality: N/A;  . CORONARY BALLOON ANGIOPLASTY N/A 09/20/2017   Procedure: CORONARY BALLOON ANGIOPLASTY;  Surgeon: AWellington Hampshire MD;  [facility];  Service: Cardiovascular;  Laterality: N/A;  . DILATION AND CURETTAGE OF UTERUS    . ESOPHAGOGASTRODUODENOSCOPY (EGD) WITH PROPOFOL N/A 09/14/2014   Procedure: ESOPHAGOGASTRODUODENOSCOPY (EGD) WITH PROPOFOL;  Surgeon: MJosefine Class MD;  [facility];  Service: Endoscopy;  Laterality: N/A;  . ESOPHAGOGASTRODUODENOSCOPY (EGD) WITH PROPOFOL N/A 04/27/2016   Procedure: ESOPHAGOGASTRODUODENOSCOPY (EGD) WITH PROPOFOL;  Surgeon: DLucilla Lame MD;  [place]: [facility]  Service: Endoscopy;  Laterality: N/A;  Diabetic - oral meds  .  ESOPHAGOGASTRODUODENOSCOPY (EGD) WITH PROPOFOL N/A 01/12/2018   Procedure: ESOPHAGOGASTRODUODENOSCOPY (EGD) WITH PROPOFOL;  Surgeon: Toledo, TBenay Pike MD;  [facility];  Service: Endoscopy;  Laterality: N/A;  . [facility]  . Left Carotid to sublcavian artery bypass w/ subclavian artery ligation     a. Performed @ [facility]  . LEFT HEART CATH AND CORONARY ANGIOGRAPHY Left 06/29/2017   Procedure: LEFT HEART CATH AND CORONARY ANGIOGRAPHY;  Surgeon: AWellington Hampshire MD;  [facility];  Service: Cardiovascular;  Laterality: Left;  . LEFT HEART CATH AND CORONARY ANGIOGRAPHY N/A 09/20/2017   Procedure: LEFT HEART CATH AND CORONARY ANGIOGRAPHY;  Surgeon: AFletcher Anon  Mertie Clause, MD;  Location: [place] [facility] LAB;  Service: Cardiovascular;  Laterality: N/A;  . LEFT HEART CATH AND CORONARY ANGIOGRAPHY N/A 12/20/2017   Procedure: LEFT HEART CATH AND CORONARY ANGIOGRAPHY;  Surgeon: Wellington Hampshire, MD;  Location: [place] CV LAB;  Service: Cardiovascular;  Laterality: N/A;  . MELANOMA EXCISION  2016   Dr. Evorn Gong  . [place]  2002  . RIGHT OOPHORECTOMY    . SHOULDER ARTHROSCOPY WITH OPEN ROTATOR CUFF REPAIR Right 01/07/2016   Procedure: SHOULDER ARTHROSCOPY WITH DEBRIDMENT, SUBACHROMIAL DECOMPRESSION;  Surgeon: Corky Mull, MD;  Location: [facility];  Service: Orthopedics;  Laterality: Right;  . SHOULDER ARTHROSCOPY WITH OPEN ROTATOR CUFF REPAIR Right 03/16/2017   Procedure: SHOULDER ARTHROSCOPY WITH OPEN ROTATOR CUFF REPAIR POSSIBLE BICEPS TENODESIS;  Surgeon: Corky Mull, MD;  Location: [facility];  Service: Orthopedics;  Laterality: Right;  . TRIGGER FINGER RELEASE Right     Middle Finger   Family History:  Family History  Problem Relation Age of Onset  . Hypertension Mother   . Anxiety disorder Mother   . Depression Mother   . Bipolar disorder Mother   . Heart disease Mother        No details  . Hyperlipidemia  Mother   . Kidney disease Father   . Heart disease Father 80  . Hypertension Father   . Diabetes Father   . Stroke Father   . Colon cancer Father        dx in his 89's  . Anxiety disorder Father   . Depression Father   . Skin cancer Father   . Kidney disease Sister   . Thyroid nodules Sister   . Hypertension Sister   . Hypertension Sister   . Diabetes Sister   . Hyperlipidemia Sister   . Depression Sister   . Breast cancer Maternal Aunt 17  . Breast cancer Maternal Aunt 71  . Ovarian cancer Cousin   . Colon cancer Cousin   . Breast cancer Other   . Kidney cancer Neg Hx   . Bladder Cancer Neg Hx    Family Psychiatric  History: None known Tobacco Screening: Have you used any form of tobacco in the last 30 days? (Cigarettes, Smokeless Tobacco, Cigars, and/or Pipes): No Social History:  Social History   Substance and Sexual Activity  Alcohol Use No  . Alcohol/week: 0.0 standard drinks     Social History   Substance and Sexual Activity  Drug Use No    Additional Social History:                           Allergies:   Allergies  Allergen Reactions  . Lipitor [Atorvastatin] Other (See Comments)    Leg pains  . Tramadol Other (See Comments)    Mouth feels like it's on fire   Lab Results:  Results for orders placed or performed during the hospital encounter of 03/02/18 (from the past 48 hour(s))  Glucose, capillary     Status: Abnormal   Collection Time: 03/02/18  7:28 PM  Result Value Ref Range   Glucose-Capillary 39 (LL) 70 - 99 mg/dL   Comment 1 Notify RN   Glucose, capillary     Status: Abnormal   Collection Time: 03/02/18  7:55 PM  Result Value Ref Range   Glucose-Capillary 143 (H) 70 - 99 mg/dL  Glucose, capillary     Status: Abnormal   Collection Time: 03/03/18  7:03 AM  Result Value Ref Range   Glucose-Capillary 16 (LL) 70 - 99 mg/dL   Comment 1 Notify RN   Glucose, capillary     Status: Abnormal   Collection Time: 03/03/18  7:05 AM   Result Value Ref Range   Glucose-Capillary 19 (LL) 70 - 99 mg/dL   Comment 1 Notify RN   Glucose, capillary     Status: Abnormal   Collection Time: 03/03/18  7:15 AM  Result Value Ref Range   Glucose-Capillary 39 (LL) 70 - 99 mg/dL   Comment 1 Notify RN   Glucose, capillary     Status: Abnormal   Collection Time: 03/03/18  7:30 AM  Result Value Ref Range   Glucose-Capillary 119 (H) 70 - 99 mg/dL  Basic metabolic panel     Status: Abnormal   Collection Time: 03/03/18  7:58 AM  Result Value Ref Range   Sodium 136 135 - 145 mmol/L   Potassium 4.7 3.5 - 5.1 mmol/L   Chloride 109 98 - 111 mmol/L   CO2 20 (L) 22 - 32 mmol/L   Glucose, Bld 346 (H) 70 - 99 mg/dL   BUN 41 (H) 6 - 20 mg/dL   Creatinine, Ser 2.45 (H) 0.44 - 1.00 mg/dL   Calcium 8.6 (L) 8.9 - 10.3 mg/dL   GFR calc non Af Amer 22 (L) >60 mL/min   GFR calc Af Amer 26 (L) >60 mL/min   Anion gap 7 5 - 15    Comment: Performed at [facility], [address] [place], [place] [address]  CBC     Status: Abnormal   Collection Time: 03/03/18  7:58 AM  Result Value Ref Range   WBC 11.2 (H) 4.0 - 10.5 K/uL   RBC 3.46 (L) 3.87 - 5.11 MIL/uL   Hemoglobin 9.0 (L) 12.0 - 15.0 g/dL   HCT 29.5 (L) 36.0 - 46.0 %   MCV 85.3 80.0 - 100.0 fL   MCH 26.0 26.0 - 34.0 pg   MCHC 30.5 30.0 - 36.0 g/dL   RDW 14.1 11.5 - 15.5 %   Platelets 200 150 - 400 K/uL   nRBC 0.0 0.0 - 0.2 %    Comment: Performed at [facility], [address] [place], [place] [address]    Blood Alcohol level:  Lab Results  Component Value Date   [facility] <10 03/01/2018   ETH <5 13/09/6576    Metabolic Disorder Labs:  Lab Results  Component Value Date   HGBA1C 5.8 (H) 02/18/2018   MPG 119.76 02/18/2018   MPG 116.89 12/19/2017   Lab Results  Component Value Date   PROLACTIN 11.6 02/15/2015   Lab Results  Component Value Date   CHOL 161 02/18/2018   TRIG 103 02/18/2018   HDL 31 (L) 02/18/2018   CHOLHDL 5.2 02/18/2018   VLDL 21  02/18/2018   LDLCALC 109 (H) 02/18/2018   LDLCALC 35 09/20/2017    Current Medications: Current Facility-Administered Medications  Medication Dose Route Frequency Provider Last Rate Last Dose  . acetaminophen (TYLENOL) tablet 650 mg  650 mg Oral Q6H PRN Lavella Hammock, MD      . ALPRAZolam Duanne Moron) tablet 1 mg  1 mg Oral QHS Daphna Lafuente T, MD      . alum & mag hydroxide-simeth (MAALOX/MYLANTA) 200-200-20 MG/5ML suspension 30 mL  30 mL Oral Q4H PRN Lavella Hammock, MD      . amLODipine (NORVASC) tablet 5 mg  5 mg Oral Daily Lavella Hammock, MD  5 mg at 03/03/18 0903  . aspirin chewable tablet 325 mg  325 mg Oral Daily Lavella Hammock, MD   325 mg at 03/03/18 0901  . buPROPion (WELLBUTRIN XL) 24 hr tablet 150 mg  150 mg Oral Daily Nazaiah Navarrete T, MD      . ciprofloxacin (CIPRO) tablet 500 mg  500 mg Oral BID Pucilowska, Jolanta B, MD   500 mg at 03/03/18 0911  . clopidogrel (PLAVIX) tablet 75 mg  75 mg Oral Q breakfast Lavella Hammock, MD   75 mg at 03/03/18 5427  . gabapentin (NEURONTIN) capsule 300 mg  300 mg Oral BID Lavella Hammock, MD   300 mg at 03/03/18 0902  . glucagon (human recombinant) (GLUCAGEN) injection 1 mg  1 mg Intravenous Once PRN Pucilowska, Jolanta B, MD      . hydrOXYzine (ATARAX/VISTARIL) tablet 25 mg  25 mg Oral TID PRN Lavella Hammock, MD      . isosorbide mononitrate (IMDUR) 24 hr tablet 120 mg  120 mg Oral Daily Lavella Hammock, MD   120 mg at 03/03/18 0623  . levothyroxine (SYNTHROID, LEVOTHROID) tablet 25 mcg  25 mcg Oral Q0600 Lavella Hammock, MD   25 mcg at 03/03/18 [phone]  . losartan (COZAAR) tablet 12.5 mg  12.5 mg Oral Daily Lavella Hammock, MD   12.5 mg at 03/03/18 3151  . metoprolol succinate (TOPROL-XL) 24 hr tablet 100 mg  100 mg Oral Daily Lavella Hammock, MD   100 mg at 03/03/18 0912  . nitroGLYCERIN (NITROSTAT) SL tablet 0.4 mg  0.4 mg Sublingual Q5 min PRN Lavella Hammock, MD      . pantoprazole (PROTONIX) EC tablet 40 mg  40 mg Oral BID  Lavella Hammock, MD   40 mg at 03/03/18 7616  . promethazine (PHENERGAN) tablet 25 mg  25 mg Oral Q6H PRN Lavella Hammock, MD      . pyridostigmine (MESTINON) tablet 60 mg  60 mg Oral TID Lavella Hammock, MD   60 mg at 03/03/18 0737  . rosuvastatin (CRESTOR) tablet 40 mg  40 mg Oral q1800 Lavella Hammock, MD   40 mg at 03/02/18 1820  . traZODone (DESYREL) tablet 100 mg  100 mg Oral QHS Massiel Stipp T, MD      . vitamin B-12 (CYANOCOBALAMIN) tablet 1,000 mcg  1,000 mcg Oral Daily Lavella Hammock, MD   1,000 mcg at 03/03/18 0902   PTA Medications: Medications Prior to Admission  Medication Sig Dispense Refill Last Dose  . ALPRAZolam (XANAX) 1 MG tablet TAKE 1 TABLET (1 MG TOTAL) BY MOUTH DAILY AS NEEDED FOR ANXIETY. 30 tablet 5 Taking  . amLODipine (NORVASC) 5 MG tablet Take 1 tablet (5 mg total) by mouth daily. 90 tablet 6 Taking  . aspirin 81 MG chewable tablet Chew 1 tablet (81 mg total) by mouth daily. (Patient taking differently: Chew 325 mg by mouth daily. ) 30 tablet 11 Taking  . Blood Glucose Monitoring Suppl (ONE TOUCH ULTRA 2) w/Device KIT Use to check blood sugar once a day. Dx. E11.9 1 each 0 Taking  . buPROPion (WELLBUTRIN XL) 150 MG 24 hr tablet TAKE 1 TABLET BY MOUTH DAILY 30 tablet 11 Taking  . ciprofloxacin (CIPRO) 500 MG tablet Take 1 tablet (500 mg total) by mouth 2 (two) times daily for 7 days. 14 tablet 0 Taking  . citalopram (CELEXA) 40 MG tablet Take 1 tablet (40 mg total) by mouth daily.  30 tablet 5 Taking  . clopidogrel (PLAVIX) 75 MG tablet Take 1 tablet (75 mg total) by mouth daily with breakfast. 30 tablet 2 Taking  . Cyanocobalamin (VITAMIN B 12 PO) Take 1,000 mcg by mouth daily.    Taking  . gabapentin (NEURONTIN) 300 MG capsule TAKE 1 CAPSULE BY MOUTH TWICE A DAY (Patient taking differently: Take 300 mg by mouth 2 (two) times daily. ) 60 capsule 5 Taking  . isosorbide mononitrate (IMDUR) 120 MG 24 hr tablet Take 1 tablet (120 mg total) by mouth daily. 90 tablet  1 Taking  . levothyroxine (SYNTHROID, LEVOTHROID) 25 MCG tablet Take 1 tablet (25 mcg total) daily before breakfast by mouth. 30 tablet 12 Taking  . losartan (COZAAR) 25 MG tablet Take 1/2 tablet (12.5 mg) by mouth once daily (Patient taking differently: Take 12.5 mg by mouth daily. ) 45 tablet 1 Taking  . metFORMIN (GLUCOPHAGE-XR) 500 MG 24 hr tablet Take 500 mg by mouth daily with breakfast.   Taking  . metoprolol succinate (TOPROL-XL) 50 MG 24 hr tablet Take 1 tablet (50 mg total) by mouth daily. Take with or immediately following a meal. (Patient taking differently: Take 100 mg by mouth daily. Take with or immediately following a meal.) 30 tablet 3 Taking  . nitroGLYCERIN (NITROSTAT) 0.4 MG SL tablet Place 1 tablet (0.4 mg total) under the tongue every 5 (five) minutes as needed for chest pain. 25 tablet 2 Taking  . pantoprazole (PROTONIX) 40 MG tablet Take 1 tablet (40 mg total) by mouth 2 (two) times daily. 60 tablet 5   . promethazine (PHENERGAN) 25 MG tablet Take 1 tablet (25 mg total) by mouth every 6 (six) hours as needed for nausea or vomiting. 30 tablet 3   . pyridostigmine (MESTINON) 60 MG tablet Take 60 mg by mouth 3 (three) times daily.    Taking  . rosuvastatin (CRESTOR) 40 MG tablet Take 1 tablet (40 mg total) by mouth daily. 90 tablet 3   . zolpidem (AMBIEN) 5 MG tablet Take 1 tablet (5 mg total) by mouth at bedtime as needed for up to 7 days for sleep. 7 tablet 0     Musculoskeletal: Strength & Muscle Tone: within normal limits Gait & Station: normal Patient leans: N/A  Psychiatric Specialty Exam: Physical Exam  Nursing note and vitals reviewed. Constitutional: She appears well-developed and well-nourished.  HENT:  Head: Normocephalic and atraumatic.  Eyes: Pupils are equal, round, and reactive to light. Conjunctivae are normal.  Neck: Normal range of motion.  Cardiovascular: Regular rhythm and normal heart sounds.  Respiratory: Effort normal. No respiratory distress.   GI: Soft.  Musculoskeletal: Normal range of motion.  Neurological: She is alert.  Skin: Skin is warm and dry.  Psychiatric: Judgment normal. Her affect is blunt. Her speech is delayed. She is slowed. Thought content is not paranoid. Cognition and memory are impaired. She expresses no homicidal and no suicidal ideation. She exhibits abnormal recent memory.    Review of Systems  Constitutional: Negative.   HENT: Negative.   Eyes: Negative.   Respiratory: Negative.   Cardiovascular: Negative.   Gastrointestinal: Negative.   Musculoskeletal: Negative.   Skin: Negative.   Neurological: Negative.   Psychiatric/Behavioral: Positive for memory loss. Negative for depression, hallucinations, substance abuse and suicidal ideas. The patient has insomnia. The patient is not nervous/anxious.     Blood pressure 118/66, pulse (!) 55, temperature 97.9 F (36.6 C), temperature source Oral, resp. rate 18, height 5' 3.39" (1.61 m), weight  78.5 kg, SpO2 100 %.Body mass index is 30.27 kg/m.  General Appearance: Casual  Eye Contact:  Minimal  Speech:  Slow  Volume:  Decreased  Mood:  Euthymic  Affect:  Constricted  Thought Process:  Coherent  Orientation:  Full (Time, Place, and Person)  Thought Content:  Logical  Suicidal Thoughts:  No  Homicidal Thoughts:  No  Memory:  Immediate;   Fair Recent;   Fair Remote;   Fair  Judgement:  Fair  Insight:  Fair  Psychomotor Activity:  Decreased  Concentration:  Concentration: Fair  Recall:  [employer] of Knowledge:  Fair  Language:  Fair  Akathisia:  No  Handed:  Right  AIMS (if indicated):     Assets:  Communication Skills Desire for Improvement Financial Resources/Insurance Housing Social Support  ADL's:  Intact  Cognition:  WNL  Sleep:  Number of Hours: 7.3    Treatment Plan Summary: Daily contact with patient to assess and evaluate symptoms and progress in treatment, Medication management and Plan Patient presented with a transient  delirium that appears to have resolved.  Putting the pieces of the puzzle together I suspect that what happened is that she ran out of her alprazolam and as a result could not sleep for several days.  When she combined that with taking Ambien, a medicine she is not normally familiar with, she developed a delirium with visual hallucinations which is certainly a known complication of zolpidem use.  At no time does she seem to have been actually having a mood disorder.  Does not appear to be having a psychotic disorder.  She has finally gotten some solid sleep and while still sleepy this morning appears to be returning to baseline.  I do not think there is any indication for adding an antipsychotic and I have discontinued the Zyprexa.  I have put her back on her usual psychiatric medicine.  Patient will be educated about the care that needs to be taken with use of alprazolam.  I suspect very much that she will be able to be discharged by tomorrow.  Observation Level/Precautions:  15 minute checks  Laboratory:  UDS  Psychotherapy:    Medications:    Consultations:    Discharge Concerns:    Estimated LOS:  Other:     Physician Treatment Plan for Primary Diagnosis: Major depressive disorder, recurrent, severe with psychotic features ([place]) Long Term Goal(s): Improvement in symptoms so as ready for discharge  Short Term Goals: Ability to identify changes in lifestyle to reduce recurrence of condition will improve and Ability to verbalize feelings will improve  Physician Treatment Plan for Secondary Diagnosis: Principal Problem:   Major depressive disorder, recurrent, severe with psychotic features ([place]) Active Problems:   Essential hypertension   Neuropathy ([place])   Hyperlipidemia   GERD (gastroesophageal reflux disease)   Adult hypothyroidism   Multiple sclerosis ([employer])   Vitamin B12 deficiency   H/O gastric bypass  Long Term Goal(s): Improvement in symptoms so as ready for discharge  Short Term  Goals: Compliance with prescribed medications will improve  I certify that inpatient services furnished can reasonably be expected to improve the patient's condition.    Alethia Berthold, MD 1/30/202011:01 AM

## 2018-03-03 NOTE — Plan of Care (Signed)
Pt. Complaint with medications. Pt. Denies si/hi/avh, can contract for safety. Pt. Participation with unit activities and groups appropriate. Pt. Endorses a mostly normal mood, but some anxiety expressed during medication pass, about being here on the unit.    Problem: Education: Goal: Emotional status will improve Outcome: Progressing Goal: Mental status will improve Outcome: Progressing   Problem: Activity: Goal: Interest or engagement in activities will improve Outcome: Progressing   Problem: Health Behavior/Discharge Planning: Goal: Compliance with treatment plan for underlying cause of condition will improve Outcome: Progressing   Problem: Safety: Goal: Periods of time without injury will increase Outcome: Progressing

## 2018-03-03 NOTE — Evaluation (Signed)
Clinical/Bedside Swallow Evaluation Patient Details  Name: [name] MRN: [id number] Date of Birth: [birth year]  Today's Date: 03/03/2018 Time: SLP Start Time (ACUTE ONLY): 0845 SLP Stop Time (ACUTE ONLY): 0945 SLP Time Calculation (min) (ACUTE ONLY): 60 min  Past Medical History:  Past Medical History:  Diagnosis Date  . Anemia    iron deficiency anemia  . Aortic arch aneurysm (HCC)   . Bipolar disorder (HCC)   . BRCA negative 2014  . CAD (coronary artery disease)    a. 08/2003 Cath: LAD 30-40-med Rx; b. 11/2014 PCI: LAD 80m (3.25x23 Xience Alpine DES); c. 06/2015 PCI: D1 (2.25x12 Resolute Integrity DES); d. 06/2017 PCI: Patent mLAD stent, D2 95 (PTCA); e. 09/2017 PCI: D2 99ost (CBA); d. 12/2017 Cath: LM nl, LAD 58m, 80d (small), D1 40ost, D2 95ost, LCX 40p, RCA 40ost/p->Med rx for D2 given restenosis.  . CKD (chronic kidney disease), stage III (HCC)   . Colon polyp   . CVA (cerebral vascular accident) (HCC)    Left side weakness.   . Diabetes (HCC)   . Family history of breast cancer    BRCA neg 2014  . Gastric ulcer 04/27/2011  . History of echocardiogram    a. 03/2017 Echo: EF 60-65%, no rwma; b. 02/2018 Echo: EF 60-65%, no rwma. Nl RV fxn. No cardiac source of emboli (admitted w/ stroke).  Marland Kitchen HTN (hypertension)   . Hyperlipemia   . Hypothyroid   . Malignant melanoma of skin of scalp (HCC)   . MI, acute, non ST segment elevation (HCC)   . Neuromuscular disorder (HCC)   . Orthostatic hypotension   . S/P drug eluting coronary stent placement 06/04/2015  . Sepsis (HCC) 03/18/2017  . Stroke [facility])    a. 02/2018 MRI: 7mm late acute/early subacute L medial frontal lobe inarct; b. 02/2018 MRA No large vessel occlusion or aneurysm. Mod to sev L P2 stenosis. thready L vertebral artery, diffusely dzs'd; c. 02/2018 Carotid U/S: <50% bilat ICA dzs.   Past Surgical History:  Past Surgical History:  Procedure Laterality Date  . APPENDECTOMY    . CARDIAC CATHETERIZATION N/A 11/09/2014    Procedure: Coronary Angiography;  Surgeon: Antonieta Iba, MD;  Location: [facility] INVASIVE CV LAB;  Service: Cardiovascular;  Laterality: N/A;  . CARDIAC CATHETERIZATION N/A 11/12/2014   Procedure: Coronary Stent Intervention;  Surgeon: Marcina Millard, MD;  Location: [facility] CV [facility];  Service: Cardiovascular;  Laterality: N/A;  . CARDIAC CATHETERIZATION N/A 04/18/2015   Procedure: Left Heart Cath and Coronary Angiography;  Surgeon: Antonieta Iba, MD;  Location: [facility] INV[facility] CV LAB;  Service: Cardiovascular;  Laterality: N/A;  . CARDIAC CATHETERIZATION Left 06/04/2015   Procedure: Left Heart Cath and Coronary Angiography;  Surgeon: Iran Ouch, MD;  Location: [facility] CV [facility];  Service: Cardiovascular;  Laterality: Left;  . CARDIAC CATHETERIZATION N/A 06/04/2015   Procedure: Coronary Stent Intervention;  Surgeon: Iran Ouch, MD;  Location: [facility] CV LAB;  Service: Cardiovascular;  Laterality: N/A;  . CESAREAN SECTION  2001  . CHOLECYSTECTOMY N/A 11/18/2016   Procedure: LAPAROSCOPIC CHOLECYSTECTOMY WITH INTRAOPERATIVE CHOLANGIOGRAM;  Surgeon: Kieth Brightly, MD;  Location: [facility] ORS;  Service: General;  Laterality: N/A;  . COLONOSCOPY WITH PROPOFOL N/A 04/27/2016   Procedure: COLONOSCOPY WITH PROPOFOL;  Surgeon: Midge Minium, MD;  Location: [facility];  Service: Endoscopy;  Laterality: N/A;  . COLONOSCOPY WITH PROPOFOL N/A 01/12/2018   Procedure: COLONOSCOPY WITH PROPOFOL;  Surgeon: Toledo, Boykin Nearing, MD;  Location: [facility] ENDOSCOPY;  Service: Endoscopy;  Laterality: N/A;  . CORONARY ANGIOPLASTY    . CORONARY BALLOON ANGIOPLASTY N/A 06/29/2017   Procedure: CORONARY BALLOON ANGIOPLASTY;  Surgeon: Iran Ouch, MD;  Location: [facility];  Service: Cardiovascular;  Laterality: N/A;  . CORONARY BALLOON ANGIOPLASTY N/A 09/20/2017   Procedure: CORONARY BALLOON ANGIOPLASTY;  Surgeon: Iran Ouch, MD;  Location: [facility];  Service:  Cardiovascular;  Laterality: N/A;  . DILATION AND CURETTAGE OF UTERUS    . ESOPHAGOGASTRODUODENOSCOPY (EGD) WITH PROPOFOL N/A 09/14/2014   Procedure: ESOPHAGOGASTRODUODENOSCOPY (EGD) WITH PROPOFOL;  Surgeon: Elnita Maxwell, MD;  Location: [facility] ENDOSCOPY;  Service: Endoscopy;  Laterality: N/A;  . ESOPHAGOGASTRODUODENOSCOPY (EGD) WITH PROPOFOL N/A 04/27/2016   Procedure: ESOPHAGOGASTRODUODENOSCOPY (EGD) WITH PROPOFOL;  Surgeon: Midge Minium, MD;  Location: [facility];  Service: Endoscopy;  Laterality: N/A;  Diabetic - oral meds  . ESOPHAGOGASTRODUODENOSCOPY (EGD) WITH PROPOFOL N/A 01/12/2018   Procedure: ESOPHAGOGASTRODUODENOSCOPY (EGD) WITH PROPOFOL;  Surgeon: Toledo, Boykin Nearing, MD;  Location: [facility] ENDOSCOPY;  Service: Endoscopy;  Laterality: N/A;  . GASTRIC BYPASS  09/2009   [facility]   . Left Carotid to sublcavian artery bypass w/ subclavian artery ligation     a. Performed @ [place].  . LEFT HEART CATH AND CORONARY ANGIOGRAPHY Left 06/29/2017   Procedure: LEFT HEART CATH AND CORONARY ANGIOGRAPHY;  Surgeon: Iran Ouch, MD;  Location: [facility];  Service: Cardiovascular;  Laterality: Left;  . LEFT HEART CATH AND CORONARY ANGIOGRAPHY N/A 09/20/2017   Procedure: LEFT HEART CATH AND CORONARY ANGIOGRAPHY;  Surgeon: Iran Ouch, MD;  Location: [facility];  Service: Cardiovascular;  Laterality: N/A;  . LEFT HEART CATH AND CORONARY ANGIOGRAPHY N/A 12/20/2017   Procedure: LEFT HEART CATH AND CORONARY ANGIOGRAPHY;  Surgeon: Iran Ouch, MD;  Location: [facility];  Service: Cardiovascular;  Laterality: N/A;  . MELANOMA EXCISION  2016   Dr. Adolphus Birchwood  . NOVASURE ABLATION  2002  . RIGHT OOPHORECTOMY    . SHOULDER ARTHROSCOPY WITH OPEN ROTATOR CUFF REPAIR Right 01/07/2016   Procedure: SHOULDER ARTHROSCOPY WITH DEBRIDMENT, SUBACHROMIAL DECOMPRESSION;  Surgeon: Christena Flake, MD;  Location: [facility] ORS;  Service: Orthopedics;  Laterality:  Right;  . SHOULDER ARTHROSCOPY WITH OPEN ROTATOR CUFF REPAIR Right 03/16/2017   Procedure: SHOULDER ARTHROSCOPY WITH OPEN ROTATOR CUFF REPAIR POSSIBLE BICEPS TENODESIS;  Surgeon: Christena Flake, MD;  Location: [facility] ORS;  Service: Orthopedics;  Laterality: Right;  . TRIGGER FINGER RELEASE Right     [place]   HPI:  Pt is a 48 y.o. female with a known history of Gastric Bypass surgery, GERD on PPI, anemia, aortic arch aneurysm, Bipolar Disorder, coronary artery disease and multiple catheterizations and stent placement and suggested to her medical management a few months ago, chronic kidney disease stage III, colon polyp, cerebrovascular accident with left-sided weakness, diabetes, familial breast cancer, hypertension, hyperlipidemia, hypothyroidism who was recently admitted 02/18/2018 for increased weakness on her left leg and almost dragging her feet while walking which is new for her and also have new slurred speech for 1 week.  She was noted to have acute to subacute stroke on MRI during that hospitalization. ST services completed a dysarthria evaluation w/ mild dysarthria noted. Pt was able to utilize strategies to improve articulation; speech was fully intelligible. Pt did c/o Esophageal dysmotility issues and discomfort then. Noted per previous chart notes, pt has been seen for UGIs over the years. This admission, pt is reportedly  having trouble sleeping  and hallucinations and has not slept in 3-5 days. In that setting, she is been having worsening headaches. She wishes for at least short-term treatment for insomnia. Pt has been admitted to the Behavioral Medicine Unit for further care.  NSG reported no difficulty swallowing po's at snack or w/ Pills - "she took her time". Pt has not been overly hungry per report - she does have h/o Gastric Bypass Surgery and GERD which can impact Esophageal motility and clearing. Such surgery typically recommends frequent, Small meals during the day. Pt denied any  recent weight loss. Also, any Esophageal dysmotility can increase risk for regurgitation of REFUX.  Per Radiology notes on 11/2017, Small Esophageal Web traversing approximately 1/3 of the cervical esophageal lumen anteriorly at the C5-6 level.    Assessment / Plan / Recommendation Clinical Impression  Pt appears to present w/ adequate oropharyngeal phase swallowing function w/ No overt s/s of aspiration noted during few po trials accepted during this assessment today. Pt appeared fatigued but followed through w/ po tasks feeding self sitting EOB when given encouragement. Of note, has a h/o Gastric Bypass Surgery and GERD which can impact Esophageal motility and clearing. Such surgery typically recommends frequent, Small meals during the day. Also, any Esophageal dysmotility can increase risk for regurgitation/aspiration of REFUX. Per Radiology notes on 11/2017, a "Small Esophageal Web traversing approximately 1/3 of the cervical esophageal lumen anteriorly at the C5-6 level" was noted - GI f/u was recommended. During po trials, pt consumed trials w/ no overt coughing or decline in vocal quality or respiratory effort/status. Swallows were timely w/ trials of thin liquids. During the oral phase, pt exhbiited min increased time for full mastication and clearing. Pt is missing multiple teeth; poor condition - this will impact mastication effort/effectiveness. Pt alternated food trials w/ sips of liquid to aid clearing. No unilateral OM weakness noted. Speech was clear, intelligible. Pt did present w/ fatgiue and wanted to "lie down".  Recommend a Mech Soft diet consistency (chopped meats) for easier mastication and Esophageal clearing; Thin liquids. Recommend general aspiration precautions and Pills in Puree as needed. Pt is strongly recommended to follow a GI diet for Gastric Bypass patients and f/u w/ GI for further assessment and management of ESOPHAGEAL c/o dysmotility and discomfort - any Esophageal  dysmotility can increase risk for Regurgitation and Pulmonary impact. As pt appears to have adequate oropharyngeal phase swallowing w/ reduced risk for prandial aspiration, No further ST services indicated at this time. This was discussed w/ NSG who will f/u w/ MD.  SLP Visit Diagnosis: Dysphagia, unspecified (R13.10)(Esophageal phase dysmotility)    Aspiration Risk  (reduced from an oropharyngeal phase standpoint)    Diet Recommendation  Mech Soft diet (chopped meats for easier mastication, clearing) and Thin liquids. General aspiration precautions. STRICT REFLUX PRECAUTIONS.  Medication Administration: Whole meds with puree(as needed for easier Esophageal clearing)    Other  Recommendations Recommended Consults: Consider GI evaluation;Consider esophageal assessment(Dietician f/u) Oral Care Recommendations: Oral care BID;Patient independent with oral care Other Recommendations: (n/a)   Follow up Recommendations None      Frequency and Duration (n/a)  (n/a)       Prognosis Prognosis for Safe Diet Advancement: Fair(-Good) Barriers to Reach Goals: (baseline Esophageal phase deficits/dysmotility)      Swallow Study   General Date of Onset: 03/02/18 HPI: Pt is a 48 y.o. female with a known history of Gastric Bypass surgery, GERD on PPI, anemia, aortic arch aneurysm, Bipolar Disorder, coronary  artery disease and multiple catheterizations and stent placement and suggested to her medical management a few months ago, chronic kidney disease stage III, colon polyp, cerebrovascular accident with left-sided weakness, diabetes, familial breast cancer, hypertension, hyperlipidemia, hypothyroidism who was recently admitted 02/18/2018 for increased weakness on her left leg and almost dragging her feet while walking which is new for her and also have new slurred speech for 1 week.  She was noted to have acute to subacute stroke on MRI during that hospitalization. ST services completed a dysarthria  evaluation w/ mild dysarthria noted. Pt was able to utilize strategies to improve articulation; speech was fully intelligible. Pt did c/o Esophageal dysmotility issues and discomfort then. Noted per previous chart notes, pt has been seen for UGIs over the years. This admission, pt is reportedly having trouble sleeping  and hallucinations and has not slept in 3-5 days. In that setting, she is been having worsening headaches. She wishes for at least short-term treatment for insomnia. Pt has been admitted to the Behavioral Medicine Unit for further care.  NSG reported no difficulty swallowing po's at snack or w/ Pills - "she took her time". Pt has not been overly hungry per report - she does have h/o Gastric Bypass Surgery and GERD which can impact Esophageal motility and clearing. Such surgery typically recommends frequent, Small meals during the day. Pt denied any recent weight loss. Also, any Esophageal dysmotility can increase risk for regurgitation of REFUX.  Per Radiology notes on 11/2017, Small Esophageal Web traversing approximately 1/3 of the cervical esophageal lumen anteriorly at the C5-6 level.  Type of Study: Bedside Swallow Evaluation Previous Swallow Assessment: none Diet Prior to this Study: Regular;Thin liquids Temperature Spikes Noted: No(wbc 11.2 down from 17) Respiratory Status: Room air History of Recent Intubation: No Behavior/Cognition: Alert;Cooperative;Pleasant mood(Fatigued) Oral Cavity Assessment: Within Functional Limits Oral Care Completed by SLP: Recent completion by staff Oral Cavity - Dentition: Poor condition;Missing dentition("my teeth are breaking off") Vision: Functional for self-feeding Self-Feeding Abilities: Able to feed self;Needs assist;Needs set up(Fatigue) Patient Positioning: Upright in bed(EOB) Baseline Vocal Quality: Normal;Low vocal intensity Volitional Cough: Strong Volitional Swallow: Able to elicit    Oral/Motor/Sensory Function Overall Oral  Motor/Sensory Function: Within functional limits   Ice Chips Ice chips: Not tested   Thin Liquid Thin Liquid: Within functional limits Presentation: Self Fed;Straw(supported cup w/ pt; 5 trials(sips))    Nectar Thick Nectar Thick Liquid: Not tested   Honey Thick Honey Thick Liquid: Not tested   Puree Puree: Not tested   Solid     Solid: Impaired Presentation: Self Fed(2 bites of a Nutrigrain Bar(soft cereal bar)) Oral Phase Impairments: Impaired mastication(increased time/effort) Oral Phase Functional Implications: Impaired mastication(increased time/effort) Pharyngeal Phase Impairments: (none) Other Comments: pt is missing multiple teeth; poor condition - this will impact mastication effort/effectiveness. Pt alternated food trials w/ sips of liquid to aid clearing.        Jerilynn Som, MS, CCC-SLP Cherlynn Popiel 03/03/2018,4:43 PM

## 2018-03-03 NOTE — Progress Notes (Signed)
Recreation Therapy Notes   Date: 03/03/2018  Time: 9:30 am   Location: Craft room   Behavioral response: N/A   Intervention Topic: Strengths  Discussion/Intervention: Patient did not attend group.   Clinical Observations/Feedback:  Patient did not attend group.   Kalief Kattner LRT/CTRS        Dorinda Stehr 03/03/2018 11:30 AM

## 2018-03-03 NOTE — Progress Notes (Signed)
D: Pt denies SI/HI/AVH, can contract for safety. Pt is pleasant and cooperative, engages appropriately. Pt. Endorses a mostly normal mood, but expresses some anxiety this evening, given Prn medication for this. Pt. During interviewing blames her family for having her committed on the unit. Pt. Reports that, "I was hallucinating awhile back before I was admitted, because I hadn't slept for like 3 days".    A: Q x 15 minute observation checks were completed for safety. Patient was provided with education. Patient was given/offered medications per orders. Patient  was encourage to attend groups, participate in unit activities and continue with plan of care. Pt. Chart and plans of care reviewed. Pt. Given support and encouragement.   R: Patient is complaint with medication and unit procedures. Pt. Attends snack time, eating good. Pt. Given education on swallowing precautions and how to improve swallowing. Pt. Expresses some left-sided weakness from previous stroke, but is steady in her gait and expresses she walks good. Pt. observed walking good. Pt. Blood sugars monitored per MD orders and per hypoglycemia protocols during beginning of shift when this pt. Had an incident of low blood sugar as this writer was accepting the patient assignment. Pt. Educated about MD plan to have the patient evaluated by speech pathologist.              Precautionary checks every 15 minutes for safety maintained, room free of safety hazards, patient sustains no injury or falls during this shift. Will endorse care to next shift.

## 2018-03-03 NOTE — Progress Notes (Signed)
Hypoglycemic Event  CBG: 16  Treatment: 8 oz juice/soda   Symptoms: None  Follow-up CBG: Time:0705 CBG Result:19 Follow-up CBG: [id number] CBG Result:39 Follow-up CBG: Time:0731 CBG Result:119    Possible Reasons for Event: Inadequate meal intake and Unknown  Comments/MD notified: Dr. Bary Leriche notified at 07:08am along with charge RN. Hypoglycemia protocol initiated. Hand off report given to day shift Scientist, clinical (histocompatibility and immunogenetics), Therapist, sports. Day Shift team will continue hypoglycemia protocol.      [name]

## 2018-03-03 NOTE — Progress Notes (Signed)
D: Pt denies SI/HI/AVH, can contract for safety. Pt is pleasant and cooperative, engages appropriate, but minimal. Pt. has no Complaints, denies pain. Patient Interactions are appropriate with staff and peers, but minimal. Pt. Is isolative and withdrawn to room this evening, not wanting to be out in the milieu. Pt. Endorses a normal mood. Pt. Affect mostly flat.   A: Q x 15 minute observation checks were completed for safety. Patient was provided with education, but needs reinforcement. Patient was given/offered medications per orders. Patient  was encourage to attend groups, participate in unit activities and continue with plan of care. Pt. Chart and plans of care reviewed. Pt. Given support and encouragement.   R: Patient is complaint with medications. Pt. Does not attend group or snack time this evening. Pt. Blood sugars monitored per MD orders. Pt. Aspiration precautions followed per orders and recommendations. Pt. Again at shift change when this writer was receiving report had low blood sugar, but return to [facility] as this Probation officer was accepting assignment.             Precautionary checks every 15 minutes for safety maintained, room free of safety hazards, patient sustains no injury or falls during this shift. Will endorse care to next shift.

## 2018-03-03 NOTE — Telephone Encounter (Signed)
Barnie Alderman advised of pt's current Diabetes medications.   Thanks,   -Mickel Baas

## 2018-03-03 NOTE — [facility] Suicide Risk Assessment (Signed)
[place] INPATIENT:  Family/Significant Other Suicide Prevention Education  Suicide Prevention Education:  Education Completed; Marny Lowenstein, son [phone]. has been identified by the patient as the family member/significant other with whom the patient will be residing, and identified as the person(s) who will aid the patient in the event of a mental health crisis (suicidal ideations/suicide attempt).  With written consent from the patient, the family member/significant other has been provided the following suicide prevention education, prior to the and/or following the discharge of the patient.  The suicide prevention education provided includes the following:  Suicide risk factors  Suicide prevention and interventions  National Suicide Hotline telephone number  [facility] assessment telephone number  [facility] Emergency Assistance [place] and/or Residential Mobile Crisis Unit telephone number  Request made of family/significant other to:  Remove weapons (e.g., guns, rifles, knives), all items previously/currently identified as safety concern.    Remove drugs/medications (over-the-counter, prescriptions, illicit drugs), all items previously/currently identified as a safety concern.  The family member/significant other verbalizes understanding of the suicide prevention education information provided.  The family member/significant other agrees to remove the items of safety concern listed above.  CSW spoke with pt's son [name] who expressed concerns about his mother being on BMU because she does not want to be here and he dos not feel like she should be admitted to the BMU. [name] requested copy of voluntary commitment paperwork signed by his mother stating that she was not in her right mind and any paperwork she signed should not be valid to keep her on the BMU.  [place] MSW LCSW 03/03/2018, 2:17 PM

## 2018-03-03 NOTE — [facility] Group Notes (Signed)
LCSW Group Therapy Note  03/03/2018 1:00PM  Type of Therapy/Topic:  Group Therapy:  Balance in Life  Participation Level:  Did Not Attend  Description of Group:    This group will address the concept of balance and how it feels and looks when one is unbalanced. Patients will be encouraged to process areas in their lives that are out of balance and identify reasons for remaining unbalanced. Facilitators will guide patients in utilizing problem-solving interventions to address and correct the stressor making their life unbalanced. Understanding and applying boundaries will be explored and addressed for obtaining and maintaining a balanced life. Patients will be encouraged to explore ways to assertively make their unbalanced needs known to significant others in their lives, using other group members and facilitator for support and feedback.  Therapeutic Goals: 1. Patient will identify two or more emotions or situations they have that consume much of in their lives. 2. Patient will identify signs/triggers that life has become out of balance:  3. Patient will identify two ways to set boundaries in order to achieve balance in their lives:  4. Patient will demonstrate ability to communicate their needs through discussion and/or role plays  Summary of Patient Progress:  X    Therapeutic Modalities:   Cognitive Behavioral Therapy Solution-Focused Therapy Assertiveness Training  Assunta Curtis MSW, LCSW 03/03/2018 2:09 PM
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
## 2018-03-03 NOTE — Plan of Care (Signed)
Pt. Denies si/hi/avh, can contract for safety. Pt. Complaint with medications and unit procedures. Pt. Monitored for safety on the unit. Pt. Does not attend snack time or group this evening. Pt. Endorses a normal mood, denies pain.    Problem: Education: Goal: Emotional status will improve Outcome: Progressing Goal: Mental status will improve Outcome: Progressing   Problem: Health Behavior/Discharge Planning: Goal: Compliance with treatment plan for underlying cause of condition will improve Outcome: Progressing   Problem: Safety: Goal: Periods of time without injury will increase Outcome: Progressing   Problem: Health Behavior/Discharge Planning: Goal: Compliance with therapeutic regimen will improve Outcome: Progressing   Problem: Safety: Goal: Ability to disclose and discuss suicidal ideas will improve Outcome: Progressing   Problem: Activity: Goal: Interest or engagement in activities will improve Outcome: Not Progressing

## 2018-03-03 NOTE — Progress Notes (Signed)
Inpatient Diabetes Program Recommendations  AACE/ADA: New Consensus Statement on Inpatient Glycemic Control   Target Ranges:  Prepandial:   less than 140 mg/dL      Peak postprandial:   less than 180 mg/dL (1-2 hours)      Critically ill patients:  140 - 180 mg/dL   Results for [name], [name] (MRN [id number]) as of 03/03/2018 15:08  Ref. Range 03/02/2018 19:28 03/02/2018 19:55 03/03/2018 07:03 03/03/2018 07:05 03/03/2018 07:15 03/03/2018 07:30  Glucose-Capillary Latest Ref Range: 70 - 99 mg/dL 39 (LL) 143 (H) 16 (LL) 19 (LL) 39 (LL) 119 (H)   Review of Glycemic Control  Diabetes history: DM2 Outpatient Diabetes medications: Metformin XR 500 mg QAM Current orders for Inpatient glycemic control: None  Inpatient Diabetes Program Recommendations:   Consult: If patient continues to experience hypoglycemia, MD may want to consider reaching out to Dr. Gabriel Carina directly to discuss patient with recurrent hypoglycemia.  NOTE: In reviewing the chart, noted patient has Metformin XR 500 mg QAM listed on home medication list. Patient was recently inpatient and had been on Actos 15 mg daily as an outpatient which was continued at discharge on 02/19/18 per discharge summary. Called PCP office to inquire about prescribed DM medications and informed patient sees H. Pasty Arch, NP (works with Dr. Gabriel Carina) for DM management. Medical Assistant at PCP office notes that patient seen Renae Gloss, NP on 02/14/18 and patient was instructed to discontinue Actos and resume Metformin XR 500 mg QAM. In reviewing the chart further, noted in [place] on office note on 02/14/18 by Renae Gloss, NP  " Current diabetes medications: Actos 15 mg daily. NovoLog sliding scale was discontinued at last clinic visit, as patient said that she was not using it. Previously on Glipizide. Metformin and Invokana, previously discontinued secondary to compromised renal function. Patient states today that she self discontinued Actos approximately 2  months ago secondary to concern regarding weight gain. She restarted metformin 1000 mg each morning.  Diabetes is well controlled but with hypoglycemic episodes, possibly due to patient self determination of medication regimen. I will adjust medication regimen as follows: Hold Actos. Decrease metformin therapy secondary to 500 mg daily."  Noted hypoglycemia on 03/02/18 and again today. Per chart, patient has NOT received any DM medications since arriving at the hospital. Unclear why patient is experiencing recurrent hypoglycemia.   If recurrent hypoglycemia continues to be an issue, attending MD may want to call Dr. Joycie Peek office and speak with her directly to discuss.  Thanks, Barnie Alderman, RN, MSN, CDE Diabetes Coordinator Inpatient Diabetes Program [phone] (Team Pager from 8am to 5pm)

## 2018-03-03 NOTE — [facility] Counselor (Signed)
CSW attempted to complete PSA 2x this morning. Pt was sleep and unable to be roused. CSW will try again this afternoon.  Evalina Field, MSW, LCSW Clinical Social Work 03/03/2018 11:16 AM

## 2018-03-03 NOTE — [facility] Group Notes (Signed)
[facility] Group Notes:  (Nursing/MHT/Case Management/Adjunct)  Date:  03/03/2018  Time:  9:18 PM  Type of Therapy:  Group Therapy  Participation Level:  Did Not Attend   Nehemiah Settle 03/03/2018, 9:18 PM

## 2018-03-03 NOTE — Plan of Care (Signed)
Patient is alert and oriented. Denies SI, HI and AVH. Patient blood glucose this morning did drop to 16 but after breakfast glucose level increased  to 119. Patient complained of difficulty digesting foods, no appetite to eat but that could be due to gastric bypass surgery in the past. Patient states she has no issues going to the bathroom last BM this moring

## 2018-03-03 NOTE — [facility] Suicide Risk Assessment (Signed)
[facility] INPATIENT:  Family/Significant Other Suicide Prevention Education  Suicide Prevention Education:  Contact Attempts: Marny Lowenstein, son [phone] been identified by the patient as the family member/significant other with whom the patient will be residing, and identified as the person(s) who will aid the patient in the event of a mental health crisis.  With written consent from the patient, two attempts were made to provide suicide prevention education, prior to and/or following the patient's discharge.  We were unsuccessful in providing suicide prevention education.  A suicide education pamphlet was given to the patient to share with family/significant other.  Date and time of first attempt: 03/03/2018 at 1:15PM Date and time of second attempt:   CSW left a HIPAA compliant voicemail requesting a call back at earliest convenience. CSW will also try to contact [name] again at a later date.  Mariann Laster Dejean Tribby LCSW 03/03/2018, 1:16 PM

## 2018-03-03 NOTE — Telephone Encounter (Signed)
Barnie Alderman w/ Inpatient Diabetes Team at [facility] [phone]  Needing to discuss pt's current diabetic Rx she's taking.  Thanks, [employer]

## 2018-03-03 NOTE — [facility] Suicide Risk Assessment (Signed)
[facility] Admission Suicide Risk Assessment   Nursing information obtained from:  Patient Demographic factors:  Caucasian, Unemployed Current Mental Status:  NA Loss Factors:  Loss of significant relationship Historical Factors:  NA Risk Reduction Factors:  Living with another person, especially a relative, Sense of responsibility to family  Total Time spent with patient: 1 hour Principal Problem: Major depressive disorder, recurrent, severe with psychotic features ([place]) Diagnosis:  Principal Problem:   Major depressive disorder, recurrent, severe with psychotic features ([place]) Active Problems:   Essential hypertension   Neuropathy (HCC)   Hyperlipidemia   GERD (gastroesophageal reflux disease)   Adult hypothyroidism   Multiple sclerosis ([place])   Vitamin B12 deficiency   H/O gastric bypass  Subjective Data: This is a patient with a history of depression and multiple medical problems admitted through the emergency room.  Totally denies any suicidal ideation.  No evidence of any recent suicidal or homicidal behavior or statements.  Symptoms of hallucinations improving.  Denies depression.  Continued Clinical Symptoms:  Alcohol Use Disorder Identification Test Final Score (AUDIT): 0 The "Alcohol Use Disorders Identification Test", Guidelines for Use in Primary Care, Second Edition.  World Pharmacologist [facility]). Score between 0-7:  no or low risk or alcohol related problems. Score between 8-15:  moderate risk of alcohol related problems. Score between 16-19:  high risk of alcohol related problems. Score 20 or above:  warrants further diagnostic evaluation for alcohol dependence and treatment.   CLINICAL FACTORS:   Depression:   Insomnia   Musculoskeletal: Strength & Muscle Tone: within normal limits Gait & Station: normal Patient leans: N/A  Psychiatric Specialty Exam: Physical Exam  Nursing note and vitals reviewed. Constitutional: She appears well-developed and  well-nourished.  HENT:  Head: Normocephalic and atraumatic.  Eyes: Pupils are equal, round, and reactive to light. Conjunctivae are normal.  Neck: Normal range of motion.  Cardiovascular: Regular rhythm and normal heart sounds.  Respiratory: Effort normal. No respiratory distress.  GI: Soft.  Musculoskeletal: Normal range of motion.  Neurological: She is alert.  Skin: Skin is warm and dry.  Psychiatric: Judgment normal. Her affect is blunt. Her speech is delayed. She is slowed. Thought content is not paranoid and not delusional. Cognition and memory are impaired. She expresses no homicidal and no suicidal ideation. She exhibits abnormal recent memory.    Review of Systems  Constitutional: Negative.   HENT: Negative.   Eyes: Negative.   Respiratory: Negative.   Cardiovascular: Negative.   Gastrointestinal: Negative.   Musculoskeletal: Negative.   Skin: Negative.   Neurological: Negative.   Psychiatric/Behavioral: Positive for memory loss. Negative for depression, hallucinations, substance abuse and suicidal ideas. The patient has insomnia. The patient is not nervous/anxious.     Blood pressure 118/66, pulse (!) 55, temperature 97.9 F (36.6 C), temperature source Oral, resp. rate 18, height 5' 3.39" (1.61 m), weight 78.5 kg, SpO2 100 %.Body mass index is 30.27 kg/m.  General Appearance: Casual  Eye Contact:  Minimal  Speech:  Slow  Volume:  Decreased  Mood:  Dysphoric  Affect:  Constricted  Thought Process:  Coherent  Orientation:  Full (Time, Place, and Person)  Thought Content:  Logical  Suicidal Thoughts:  No  Homicidal Thoughts:  No  Memory:  Immediate;   Fair Recent;   Fair Remote;   Fair  Judgement:  Fair  Insight:  Shallow  Psychomotor Activity:  Decreased  Concentration:  Concentration: Poor  Recall:  [employer] of Knowledge:  Fair  Language:  Fair  Akathisia:  No  Handed:  Right  AIMS (if indicated):     Assets:  Desire for Improvement Housing Social  Support  ADL's:  Intact  Cognition:  Impaired,  Mild  Sleep:  Number of Hours: 7.3      COGNITIVE FEATURES THAT CONTRIBUTE TO RISK:  Loss of executive function    SUICIDE RISK:   Minimal: No identifiable suicidal ideation.  Patients presenting with no risk factors but with morbid ruminations; may be classified as minimal risk based on the severity of the depressive symptoms  PLAN OF CARE: Patient admitted to the hospital after a couple days of sleeplessness followed by what was probably a delirium.  Now returning to baseline.  15-minute checks in place.  Stabilize medication.  Likely will be able to be discharged soon.  Follow-up care to be arranged with her primary care doctor  I certify that inpatient services furnished can reasonably be expected to improve the patient's condition.   Alethia Berthold, MD 03/03/2018, 10:55 AM

## 2018-03-04 LAB — GLUCOSE, CAPILLARY
GLUCOSE-CAPILLARY: 92 mg/dL (ref 70–99)
Glucose-Capillary: 51 mg/dL — ABNORMAL LOW (ref 70–99)
Glucose-Capillary: 85 mg/dL (ref 70–99)
Glucose-Capillary: 168 mg/dL — ABNORMAL HIGH (ref 70–99)

## 2018-03-04 MED ORDER — ALPRAZOLAM 1 MG PO TABS: 1.0000 mg | ORAL_TABLET | Freq: Every day | ORAL | 0 refills | Status: DC

## 2018-03-04 NOTE — Progress Notes (Signed)
  [facility] Adult Case Management Discharge Plan :  Will you be returning to the same living situation after discharge:  Yes,  home with son At discharge, do you have transportation home?: Yes,  son will pick her up Do you have the ability to pay for your medications: Yes,  Medicare  Release of information consent forms completed and in the chart.  Patient to Follow up at: Follow-up Information    [facility] Follow up on 03/09/2018.   Why:  Meet Sherrian Divers at [facility] on Wednesday 03/09/18 at 7:15AM for Peer Support Services. Thank You! Contact information: [place] [id number] [phone]           Next level of care provider has access to [facility] and Suicide Prevention discussed: Yes,  SPE completed with pts son.  Have you used any form of tobacco in the last 30 days? (Cigarettes, Smokeless Tobacco, Cigars, and/or Pipes): No  Has patient been referred to the Quitline?: Patient refused referral  Patient has been referred for addiction treatment: [place], LCSW 03/04/2018, 9:49 AM

## 2018-03-04 NOTE — Discharge Summary (Addendum)
Physician Discharge Summary Note  Patient:  [name] is an 48 y.o., female MRN:  [id number] DOB:  Dec 14, 1970 Patient phone:  [phone] (home)  Patient address:   [address] [place] [address],  Total Time spent with patient: 45 minutes  Date of Admission:  03/02/2018 Date of Discharge: March 04, 2018  Reason for Admission: Patient was admitted through the emergency room where she presented with acute visual hallucinations and confusion  Principal Problem: Acute delirium Discharge Diagnoses: Principal Problem:   Acute delirium Active Problems:   Essential hypertension   Neuropathy ([place])   Hyperlipidemia   GERD (gastroesophageal reflux disease)   Adult hypothyroidism   Multiple sclerosis ([place])   Major depressive disorder, recurrent, severe with psychotic features ([place])   Vitamin B12 deficiency   Insomnia   H/O gastric bypass   Past Psychiatric History: Patient has a history of chronic depression and anxiety managed by her primary care doctor  Past Medical History:  Past Medical History:  Diagnosis Date  . Anemia    iron deficiency anemia  . Aortic arch aneurysm ([place])   . Bipolar disorder ([place])   . BRCA negative 2014  . CAD (coronary artery disease)    a. 08/2003 Cath: LAD 30-40-med Rx; b. 11/2014 PCI: LAD 52m(3.25x23 Xience Alpine DES); c. 06/2015 PCI: D1 (2.25x12 Resolute Integrity DES); d. 06/2017 PCI: Patent mLAD stent, D2 95 (PTCA); e. 09/2017 PCI: D2 99ost (CBA); d. 12/2017 Cath: LM nl, LAD 364m80d (small), D1 40ost, D2 95ost, LCX 40p, RCA 40ost/p->Med rx for D2 given restenosis.  . CKD (chronic kidney disease), stage III ([place]  . Colon polyp   . CVA (cerebral vascular accident) ([facility]   Left side weakness.   . Diabetes ([facility] [place]  . Family history of breast cancer    BRCA neg 2014  . Gastric ulcer 04/27/2011  . History of echocardiogram    a. 03/2017 Echo: EF 60-65%, no rwma; b. 02/2018 Echo: EF 60-65%, no rwma. Nl RV fxn. No cardiac source of  emboli (admitted w/ stroke).  . [place] (hypertension)   . Hyperlipemia   . Hypothyroid   . Malignant melanoma of skin of scalp ([facility]  . MI, acute, non ST segment elevation ([facility]  . Neuromuscular disorder ([facility]  . Orthostatic hypotension   . S/P drug eluting coronary stent placement 06/04/2015  . Sepsis ([facility]2/14/2019  . St[facility] ([facility]   a. 02/2018 MRI: 26m84mate acute/early subacute L medial frontal lobe inarct; b. 02/2018 MRA No large vessel occlusion or aneurysm. Mod to sev L P2 stenosis. thready L vertebral artery, diffusely dzs'd; c. 02/2018 Carotid U/S: <50% bilat ICA dzs.    Past Surgical History:  Procedure Laterality Date  . APPENDECTOMY    . CARDIAC CATHETERIZATION N/A 11/09/2014   Procedure: Coronary Angiography;  Surgeon: TimMinna MerrittsD;  [facility];  Service: Cardiovascular;  Laterality: N/A;  . CARDIAC CATHETERIZATION N/A 11/12/2014   Procedure: Coronary Stent Intervention;  Surgeon: AleIsaias CowmanD;  [facility];  Service: Cardiovascular;  Laterality: N/A;  . CARDIAC CATHETERIZATION N/A 04/18/2015   Procedure: Left Heart Cath and Coronary Angiography;  Surgeon: TimMinna MerrittsD;  [facility];  Service: Cardiovascular;  Laterality: N/A;  . CARDIAC CATHETERIZATION Left 06/04/2015   Procedure: Left Heart Cath and Coronary Angiography;  Surgeon: MuhWellington HampshireD;  [facility];  Service: Cardiovascular;  Laterality: Left;  . CARDIAC CATHETERIZATION N/A 06/04/2015   Procedure: Coronary Stent Intervention;  Surgeon: Wellington Hampshire, MD;  Location: [place] [facility];  Service: Cardiovascular;  Laterality: N/A;  . CESAREAN SECTION  2001  . CHOLECYSTECTOMY N/A 11/18/2016   Procedure: LAPAROSCOPIC CHOLECYSTECTOMY WITH INTRAOPERATIVE CHOLANGIOGRAM;  Surgeon: Christene Lye, MD;  [facility];  Service: General;  Laterality: N/A;  . COLONOSCOPY WITH PROPOFOL N/A 04/27/2016    Procedure: COLONOSCOPY WITH PROPOFOL;  Surgeon: [name], MD;  Location: [place];  Service: Endoscopy;  Laterality: N/A;  . COLONOSCOPY WITH PROPOFOL N/A 01/12/2018   Procedure: COLONOSCOPY WITH PROPOFOL;  Surgeon: Toledo, Benay Pike, MD;  Location: [facility];  Service: Endoscopy;  Laterality: N/A;  . CORONARY ANGIOPLASTY    . CORONARY BALLOON ANGIOPLASTY N/A 06/29/2017   Procedure: CORONARY BALLOON ANGIOPLASTY;  Surgeon: Wellington Hampshire, MD;  Location: [place] [facility] LAB;  Service: Cardiovascular;  Laterality: N/A;  . CORONARY BALLOON ANGIOPLASTY N/A 09/20/2017   Procedure: CORONARY BALLOON ANGIOPLASTY;  Surgeon: Wellington Hampshire, MD;  Location: [place] CV LAB;  Service: Cardiovascular;  Laterality: N/A;  . DILATION AND CURETTAGE OF UTERUS    . ESOPHAGOGASTRODUODENOSCOPY (EGD) WITH PROPOFOL N/A 09/14/2014   Procedure: ESOPHAGOGASTRODUODENOSCOPY (EGD) WITH PROPOFOL;  Surgeon: Josefine Class, MD;  Location[facility];  Service: Endoscopy;  Laterality: N/A;  . ESOPHAGOGASTRODUODENOSCOPY (EGD) WITH PROPOFOL N/A 04/27/2016   Procedure: ESOPHAGOGASTRODUODENOSCOPY (EGD) WITH PROPOFOL;  Surgeon: [name], MD;  Location: [place];  Service: Endoscopy;  Laterality: N/A;  Diabetic - oral meds  . ESOPHAGOGASTRODUODENOSCOPY (EGD) WITH PROPOFOL N/A 01/12/2018   Procedure: ESOPHAGOGASTRODUODENOSCOPY (EGD) WITH PROPOFOL;  Surgeon: Toledo, Benay Pike, MD;  Location[facility];  Service: Endoscopy;  Laterality: N/A;  . GASTRIC BYPASS  09/2009   [facility]   . Left Carotid to sublcavian artery bypass w/ subclavian artery ligation     a. Performed @ [place] [facility].  . LEFT HEART CATH AND CORONARY ANGIOGRAPHY Left 06/29/2017   Procedure: LEFT HEART CATH AND CORONARY ANGIOGRAPHY;  Surgeon: Wellington Hampshire, MD;  Location: [place] CV LAB;  Service: Cardiovascular;  Laterality: Left;  . LEFT HEART CATH AND CORONARY ANGIOGRAPHY N/A 09/20/2017    Procedure: LEFT HEART CATH AND CORONARY ANGIOGRAPHY;  Surgeon: Wellington Hampshire, MD;  Location: [place] CV LAB;  Service: Cardiovascular;  Laterality: N/A;  . LEFT HEART CATH AND CORONARY ANGIOGRAPHY N/A 12/20/2017   Procedure: LEFT HEART CATH AND CORONARY ANGIOGRAPHY;  Surgeon: Wellington Hampshire, MD;  Location: [place] CV LAB;  Service: Cardiovascular;  Laterality: N/A;  . MELANOMA EXCISION  2016   Dr. Evorn Gong  . [place]  2002  . RIGHT OOPHORECTOMY    . SHOULDER ARTHROSCOPY WITH OPEN ROTATOR CUFF REPAIR Right 01/07/2016   Procedure: SHOULDER ARTHROSCOPY WITH DEBRIDMENT, SUBACHROMIAL DECOMPRESSION;  Surgeon: Corky Mull, MD;  Location: [facility];  Service: Orthopedics;  Laterality: Right;  . SHOULDER ARTHROSCOPY WITH OPEN ROTATOR CUFF REPAIR Right 03/16/2017   Procedure: SHOULDER ARTHROSCOPY WITH OPEN ROTATOR CUFF REPAIR POSSIBLE BICEPS TENODESIS;  Surgeon: Corky Mull, MD;  Location: [facility];  Service: Orthopedics;  Laterality: Right;  . TRIGGER FINGER RELEASE Right     [place]   Family History:  Family History  Problem Relation Age of Onset  . Hypertension Mother   . Anxiety disorder Mother   . Depression Mother   . Bipolar disorder Mother   . Heart disease Mother  No details  . Hyperlipidemia Mother   . Kidney disease Father   . Heart disease Father 51  . Hypertension Father   . Diabetes Father   . Stroke Father   . Colon cancer Father        dx in his 51's  . Anxiety disorder Father   . Depression Father   . Skin cancer Father   . Kidney disease Sister   . Thyroid nodules Sister   . Hypertension Sister   . Hypertension Sister   . Diabetes Sister   . Hyperlipidemia Sister   . Depression Sister   . Breast cancer Maternal Aunt 50  . Breast cancer Maternal Aunt 44  . Ovarian cancer Cousin   . Colon cancer Cousin   . Breast cancer Other   . Kidney cancer Neg Hx   . Bladder Cancer Neg Hx    Family Psychiatric  History: None  reported Social History:  Social History   Substance and Sexual Activity  Alcohol Use No  . Alcohol/week: 0.0 standard drinks     Social History   Substance and Sexual Activity  Drug Use No    Social History   Socioeconomic History  . Marital status: Divorced    Spouse name: Not on file  . Number of children: 1  . Years of education: Not on file  . Highest education level: Not on file  Occupational History  . 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 Disabled through 08/02/2013 per forms 02/03/2013  Social Needs  . Financial resource strain: Not on file  . Food insecurity:    Worry: Not on file    Inability: Not on file  . Transportation needs:    Medical: Not on file    Non-medical: Not on file  Tobacco Use  . Smoking status: Former Smoker    Types: Cigarettes    Last attempt to quit: 08/31/1994    Years since quitting: 23.5  . Smokeless tobacco: Never Used  . Tobacco comment: quit 28 years ago  Substance and Sexual Activity  . Alcohol use: No    Alcohol/week: 0.0 standard drinks  . Drug use: No  . Sexual activity: Not Currently    Birth control/protection: None  Lifestyle  . Physical activity:    Days per week: Not on file    Minutes per session: Not on file  . Stress: Not on file  Relationships  . Social connections:    Talks on phone: Not on file    Gets together: Not on file    Attends religious service: Not on file    Active member of club or organization: Not on file    Attends meetings of clubs or organizations: Not on file    Relationship status: Not on file  Other Topics Concern  . 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   Lives at home with son    Hospital Course: Patient was admitted to the hospital because of concern about visual hallucinations and confused  agitated behavior.  By the time I saw her the patient's symptoms had entirely resolved and she was able to give a clear history.  She told me that she had run out of her Xanax early because she was adjusting to the lower dose after having been in the hospital  recently.  When she ran out of it she was not able to sleep for several days.  Her cardiologist gave her Ambien which she tried but did not make her sleep and instead she had a transient delirium related to Ambien use with visual hallucinations.  The history is all very consistent with this.  I do not think there is any reason to think this is a mania or psychosis.  Patient is now back to her baseline with no active depression no psychosis no sign of dangerousness.  No report at all of suicidal thinking.  Patient has been counseled that she should neither over nor under use her Xanax and should get in touch with Dr. Caryn Section about this.  I have agreed to write her a prescription for 2 weeks of the medicine for now so that she does not have the risk of running out but have again made it clear that she should not overuse this medicine.  If possible I will try to send a note to Dr. Caryn Section.  Patient advised that she should avoid Ambien in the future most likely.  No need to make change to any other psychiatric medicine.  As far as her medical problems her blood sugars ran low for much of the hospital stay.  She is not currently on any active medicine related to diabetes.  She should also follow-up with her heart condition and other serious medical problems with her primary care doctor nothing else altered.  Patient agrees to plan.  Physical Findings: AIMS:  , ,  ,  ,    CIWA:    COWS:     Musculoskeletal: Strength & Muscle Tone: within normal limits Gait & Station: normal Patient leans: N/A  Psychiatric Specialty Exam: Physical Exam  Nursing note and vitals reviewed. Constitutional: She appears well-developed and well-nourished.  HENT:  Head:  Normocephalic and atraumatic.  Eyes: Pupils are equal, round, and reactive to light. Conjunctivae are normal.  Neck: Normal range of motion.  Cardiovascular: Regular rhythm and normal heart sounds.  Respiratory: Effort normal.  GI: Soft.  Musculoskeletal: Normal range of motion.  Neurological: She is alert.  Skin: Skin is warm and dry.  Psychiatric: She has a normal mood and affect. Her behavior is normal. Judgment and thought content normal.    Review of Systems  Constitutional: Negative.   HENT: Negative.   Eyes: Negative.   Respiratory: Negative.   Cardiovascular: Negative.   Gastrointestinal: Negative.   Musculoskeletal: Negative.   Skin: Negative.   Neurological: Negative.   Psychiatric/Behavioral: Negative.     Blood pressure 122/62, pulse (!) 104, temperature 97.7 F (36.5 C), temperature source Oral, resp. rate 16, height 5' 3.39" (1.61 m), weight 78.5 kg, SpO2 99 %.Body mass index is 30.27 kg/m.  General Appearance: Casual  Eye Contact:  Good  Speech:  Slow  Volume:  Normal  Mood:  Euthymic  Affect:  Constricted  Thought Process:  Goal Directed  Orientation:  Full (Time, Place, and Person)  Thought Content:  Logical  Suicidal Thoughts:  No  Homicidal Thoughts:  No  Memory:  Immediate;   Fair Recent;   Fair Remote;   Fair  Judgement:  Fair  Insight:  Fair  Psychomotor Activity:  Normal  Concentration:  Concentration: Fair  Recall:  [employer] of Knowledge:  Fair  Language:  Fair  Akathisia:  No  Handed:  Right  AIMS (if indicated):     Assets:  Communication Skills Desire for Improvement Financial Resources/Insurance  Housing Resilience Social Support  ADL's:  Intact  Cognition:  WNL  Sleep:  Number of Hours: 8     Have you used any form of tobacco in the last 30 days? (Cigarettes, Smokeless Tobacco, Cigars, and/or Pipes): No  Has this patient used any form of tobacco in the last 30 days? (Cigarettes, Smokeless Tobacco, Cigars, and/or Pipes) Yes,  No  Blood Alcohol level:  Lab Results  Component Value Date   ETH <10 03/01/2018   ETH <5 56/81/2751    Metabolic Disorder Labs:  Lab Results  Component Value Date   HGBA1C 5.8 (H) 02/18/2018   MPG 119.76 02/18/2018   MPG 116.89 12/19/2017   Lab Results  Component Value Date   PROLACTIN 11.6 02/15/2015   Lab Results  Component Value Date   CHOL 161 02/18/2018   TRIG 103 02/18/2018   HDL 31 (L) 02/18/2018   CHOLHDL 5.2 02/18/2018   VLDL 21 02/18/2018   LDLCALC 109 (H) 02/18/2018   LDLCALC 35 09/20/2017    See Psychiatric Specialty Exam and Suicide Risk Assessment completed by Attending Physician prior to discharge.  Discharge destination:  Home  Is patient on multiple antipsychotic therapies at discharge:  No   Has Patient had three or more failed trials of antipsychotic monotherapy by history:  No  Recommended Plan for Multiple Antipsychotic Therapies: NA  Discharge Instructions    Diet - low sodium heart healthy   Complete by:  As directed    Increase activity slowly   Complete by:  As directed      Allergies as of 03/04/2018      Reactions   Lipitor [atorvastatin] Other (See Comments)   Leg pains   Tramadol Other (See Comments)   Mouth feels like it's on fire      Medication List    STOP taking these medications   ciprofloxacin 500 MG tablet Commonly known as:  CIPRO   metFORMIN 500 MG 24 hr tablet Commonly known as:  GLUCOPHAGE-XR   promethazine 25 MG tablet Commonly known as:  PHENERGAN   zolpidem 5 MG tablet Commonly known as:  AMBIEN     TAKE these medications     Indication  ALPRAZolam 1 MG tablet Commonly known as:  XANAX Take 1 tablet (1 mg total) by mouth at bedtime. What changed:    when to take this  reasons to take this  Indication:  Feeling Anxious   amLODipine 5 MG tablet Commonly known as:  NORVASC Take 1 tablet (5 mg total) by mouth daily.  Indication:  Chronic Angina Following Exercise, Stress, or Excitement, High  Blood Pressure Disorder   aspirin 81 MG chewable tablet Chew 1 tablet (81 mg total) by mouth daily. What changed:  how much to take  Indication:  Stable Angina Pectoris   buPROPion 150 MG 24 hr tablet Commonly known as:  WELLBUTRIN XL TAKE 1 TABLET BY MOUTH DAILY  Indication:  Major Depressive Disorder   citalopram 40 MG tablet Commonly known as:  CELEXA Take 1 tablet (40 mg total) by mouth daily.  Indication:  Generalized Anxiety Disorder   clopidogrel 75 MG tablet Commonly known as:  PLAVIX Take 1 tablet (75 mg total) by mouth daily with breakfast.  Indication:  Cerebrovascular Accident or Stroke   gabapentin 300 MG capsule Commonly known as:  NEURONTIN TAKE 1 CAPSULE BY MOUTH TWICE A DAY  Indication:  Diabetes with Nerve Disease, Fibromyalgia Syndrome   isosorbide mononitrate 120 MG 24 hr tablet Commonly known as:  IMDUR Take 1 tablet (120 mg total) by mouth daily.  Indication:  Stable Angina Pectoris   levothyroxine 25 MCG tablet Commonly known as:  SYNTHROID, LEVOTHROID Take 1 tablet (25 mcg total) daily before breakfast by mouth.  Indication:  Underactive Thyroid   losartan 25 MG tablet Commonly known as:  COZAAR Take 1/2 tablet (12.5 mg) by mouth once daily What changed:    how much to take  how to take this  when to take this  additional instructions  Indication:  High Blood Pressure Disorder   metoprolol succinate 50 MG 24 hr tablet Commonly known as:  TOPROL-XL Take 1 tablet (50 mg total) by mouth daily. Take with or immediately following a meal. What changed:  how much to take  Indication:  High Blood Pressure Disorder, Stable Angina Pectoris   nitroGLYCERIN 0.4 MG SL tablet Commonly known as:  NITROSTAT Place 1 tablet (0.4 mg total) under the tongue every 5 (five) minutes as needed for chest pain.  Indication:  Acute Angina Pectoris   ONE TOUCH ULTRA 2 w/Device Kit Use to check blood sugar once a day. Dx. E11.9  Indication:  Diabetes    pantoprazole 40 MG tablet Commonly known as:  PROTONIX Take 1 tablet (40 mg total) by mouth 2 (two) times daily.  Indication:  Gastroesophageal Reflux Disease   pyridostigmine 60 MG tablet Commonly known as:  MESTINON Take 60 mg by mouth 3 (three) times daily.  Indication:  Myasthenia Gravis   rosuvastatin 40 MG tablet Commonly known as:  CRESTOR Take 1 tablet (40 mg total) by mouth daily.  Indication:  Disease involving Lipid Deposits in the Arteries   VITAMIN B 12 PO Take 1,000 mcg by mouth daily.  Indication:  Anemia      Follow-up Information    [place] Follow up on 03/09/2018.   Why:  Meet Sherrian Divers at [facility] on Wednesday 03/09/18 at 7:15AM for Peer Support Services. Thank You! Contact information: [place] [address] [phone]           Follow-up recommendations:  Activity:  As tolerated Diet:  Per recommendations from her other outpatient providers including primary care doctor and cardiologist Other:  Follow-up with all of her usual providers.  Comments: As mentioned the patient has been counseled to not over or under use her Xanax and to only make changes in her medicine after consulting with her physicians and to avoid Ambien.  PATIENT SHOULD CONTINUE RECEIVING HOME HEALTH CARE AS HAD ALREADY BEEN SET UP PRIOR TO THIS ADMISSION. [place]  Signed: Alethia Berthold, MD 03/04/2018, 12:56 PM

## 2018-03-04 NOTE — Progress Notes (Signed)
Recreation Therapy Notes   Date: 03/04/2018  Time: 9:30 am  Location: Craft Room  Behavioral response: Appropriate  Intervention Topic: Teamwork  Discussion/Intervention:  Group content on today was focused on teamwork. The group identified what teamwork is. Individuals described who is a part of their team. Patients expressed why they thought teamwork is important. The group stated reasons why they thought it was easier to work with a Dance movement psychotherapist team. Individuals discussed some positives and negatives of working with a team. Patients gave examples of past experiences they had while working with a team. The group participated in the intervention "What is That", where patients were given a chance to point out qualities they look for in a team mate and were able to work in teams with each other. Clinical Observations/Feedback:  Patient came to group late late and identified her son as a part of her team. Individual was social with peers and staff while participating in the intervention. Tanny Harnack LRT/CTRS         Timmey Lamba 03/04/2018 1:40 PM

## 2018-03-04 NOTE — BHH Suicide Risk Assessment (Signed)
[facility] Discharge Suicide Risk Assessment   Principal Problem: Acute delirium Discharge Diagnoses: Principal Problem:   Acute delirium Active Problems:   Essential hypertension   Neuropathy (HCC)   Hyperlipidemia   GERD (gastroesophageal reflux disease)   Adult hypothyroidism   Multiple sclerosis ([place])   Major depressive disorder, recurrent, severe with psychotic features ([place])   Vitamin B12 deficiency   Insomnia   H/O gastric bypass   Total Time spent with patient: 45 minutes  Musculoskeletal: Strength & Muscle Tone: within normal limits Gait & Station: normal Patient leans: N/A  Psychiatric Specialty Exam: Review of Systems  Constitutional: Negative.   HENT: Negative.   Eyes: Negative.   Respiratory: Negative.   Cardiovascular: Negative.   Gastrointestinal: Negative.   Musculoskeletal: Negative.   Skin: Negative.   Neurological: Negative.   Psychiatric/Behavioral: Negative.     Blood pressure 122/62, pulse (!) 104, temperature 97.7 F (36.5 C), temperature source Oral, resp. rate 16, height 5' 3.39" (1.61 m), weight 78.5 kg, SpO2 99 %.Body mass index is 30.27 kg/m.  General Appearance: Casual  Eye Contact::  Good  Speech:  Normal Rate409  Volume:  Normal  Mood:  Euthymic  Affect:  Congruent  Thought Process:  Goal Directed  Orientation:  Full (Time, Place, and Person)  Thought Content:  Logical  Suicidal Thoughts:  No  Homicidal Thoughts:  No  Memory:  Immediate;   Fair Recent;   Fair Remote;   Fair  Judgement:  Fair  Insight:  Fair  Psychomotor Activity:  Decreased  Concentration:  Fair  Recall:  [employer] of Knowledge:Fair  Language: Fair  Akathisia:  No  Handed:  Right  AIMS (if indicated):     Assets:  Communication Skills Desire for Improvement Housing Resilience Social Support  Sleep:  Number of Hours: 8  Cognition: WNL  ADL's:  Intact   Mental Status Per Nursing Assessment::   On Admission:  NA  Demographic Factors:  NA  Loss  Factors: Decline in physical health  Historical Factors: NA  Risk Reduction Factors:   Sense of responsibility to family, Religious beliefs about death, Living with another person, especially a relative, Positive social support, Positive therapeutic relationship and Positive coping skills or problem solving skills  Continued Clinical Symptoms:  Depression:   Insomnia  Cognitive Features That Contribute To Risk:  Loss of executive function    Suicide Risk:  Minimal: No identifiable suicidal ideation.  Patients presenting with no risk factors but with morbid ruminations; may be classified as minimal risk based on the severity of the depressive symptoms  Follow-up Information    [place] Follow up on 03/09/2018.   Why:  Meet Sherrian Divers at [facility] on Wednesday 03/09/18 at 7:15AM for Peer Support Services. Thank You! Contact information: [place] [address] [phone]           Plan Of Care/Follow-up recommendations:  Activity:  Activity as tolerated Diet:  Diabetic diet Other:  Follow-up with medical and mental health concerns outside the hospital.  Do not overuse Xanax.  Avoid Ambien.  Alethia Berthold, MD 03/04/2018, 12:45 PM

## 2018-03-04 NOTE — Tx Team (Signed)
Interdisciplinary Treatment and Diagnostic Plan Update  03/04/2018 Time of Session: 145PM [name] MRN: [id number]  Principal Diagnosis: Acute delirium  Secondary Diagnoses: Principal Problem:   Acute delirium Active Problems:   Essential hypertension   Neuropathy (HCC)   Hyperlipidemia   GERD (gastroesophageal reflux disease)   Adult hypothyroidism   Multiple sclerosis (HCC)   Major depressive disorder, recurrent, severe with psychotic features ([place])   Vitamin B12 deficiency   Insomnia   H/O gastric bypass   Current Medications:  Current Facility-Administered Medications  Medication Dose Route Frequency Provider Last Rate Last Dose  . acetaminophen (TYLENOL) tablet 650 mg  650 mg Oral Q6H PRN Lavella Hammock, MD      . ALPRAZolam Duanne Moron) tablet 1 mg  1 mg Oral QHS Clapacs, Madie Reno, MD   1 mg at 03/03/18 2117  . alum & mag hydroxide-simeth (MAALOX/MYLANTA) 200-200-20 MG/5ML suspension 30 mL  30 mL Oral Q4H PRN Lavella Hammock, MD      . amLODipine (NORVASC) tablet 5 mg  5 mg Oral Daily Lavella Hammock, MD   5 mg at 03/04/18 8638  . aspirin chewable tablet 325 mg  325 mg Oral Daily Lavella Hammock, MD   325 mg at 03/04/18 1771  . buPROPion (WELLBUTRIN XL) 24 hr tablet 150 mg  150 mg Oral Daily Clapacs, Madie Reno, MD   150 mg at 03/04/18 1657  . clopidogrel (PLAVIX) tablet 75 mg  75 mg Oral Q breakfast Lavella Hammock, MD   75 mg at 03/04/18 9038  . gabapentin (NEURONTIN) capsule 300 mg  300 mg Oral BID Lavella Hammock, MD   300 mg at 03/04/18 3338  . glucagon (human recombinant) (GLUCAGEN) injection 1 mg  1 mg Intravenous Once PRN Pucilowska, Jolanta B, MD      . hydrOXYzine (ATARAX/VISTARIL) tablet 25 mg  25 mg Oral TID PRN Lavella Hammock, MD      . isosorbide mononitrate (IMDUR) 24 hr tablet 120 mg  120 mg Oral Daily Lavella Hammock, MD   120 mg at 03/04/18 0831  . levothyroxine (SYNTHROID, LEVOTHROID) tablet 25 mcg  25 mcg Oral Q0600 Lavella Hammock, MD   25 mcg  at 03/04/18 0630  . losartan (COZAAR) tablet 12.5 mg  12.5 mg Oral Daily Lavella Hammock, MD   12.5 mg at 03/04/18 0830  . metoprolol succinate (TOPROL-XL) 24 hr tablet 100 mg  100 mg Oral Daily Lavella Hammock, MD   100 mg at 03/04/18 3291  . multivitamin with minerals tablet 1 tablet  1 tablet Oral Daily Clapacs, Madie Reno, MD   1 tablet at 03/04/18 [phone]  . nitroGLYCERIN (NITROSTAT) SL tablet 0.4 mg  0.4 mg Sublingual Q5 min PRN Lavella Hammock, MD      . pantoprazole (PROTONIX) EC tablet 40 mg  40 mg Oral BID Lavella Hammock, MD   40 mg at 03/04/18 0600  . promethazine (PHENERGAN) tablet 25 mg  25 mg Oral Q6H PRN Lavella Hammock, MD      . protein supplement (ENSURE MAX) liquid  11 oz Oral BID Clapacs, Madie Reno, MD   11 oz at 03/04/18 [phone]  . pyridostigmine (MESTINON) tablet 60 mg  60 mg Oral TID Lavella Hammock, MD   60 mg at 03/04/18 1147  . rosuvastatin (CRESTOR) tablet 40 mg  40 mg Oral q1800 Lavella Hammock, MD   40 mg at 03/03/18 1946  . traZODone (DESYREL)  tablet 100 mg  100 mg Oral QHS Clapacs, Madie Reno, MD   100 mg at 03/03/18 2116  . vitamin B-12 (CYANOCOBALAMIN) tablet 1,000 mcg  1,000 mcg Oral Daily Lavella Hammock, MD   1,000 mcg at 03/04/18 5916   PTA Medications: Medications Prior to Admission  Medication Sig Dispense Refill Last Dose  . ALPRAZolam (XANAX) 1 MG tablet TAKE 1 TABLET (1 MG TOTAL) BY MOUTH DAILY AS NEEDED FOR ANXIETY. 30 tablet 5 Taking  . amLODipine (NORVASC) 5 MG tablet Take 1 tablet (5 mg total) by mouth daily. 90 tablet 6 Taking  . aspirin 81 MG chewable tablet Chew 1 tablet (81 mg total) by mouth daily. (Patient taking differently: Chew 325 mg by mouth daily. ) 30 tablet 11 Taking  . Blood Glucose Monitoring Suppl (ONE TOUCH ULTRA 2) w/Device KIT Use to check blood sugar once a day. Dx. E11.9 1 each 0 Taking  . buPROPion (WELLBUTRIN XL) 150 MG 24 hr tablet TAKE 1 TABLET BY MOUTH DAILY 30 tablet 11 Taking  . [EXPIRED] ciprofloxacin (CIPRO) 500 MG tablet Take  1 tablet (500 mg total) by mouth 2 (two) times daily for 7 days. 14 tablet 0 Taking  . citalopram (CELEXA) 40 MG tablet Take 1 tablet (40 mg total) by mouth daily. 30 tablet 5 Taking  . clopidogrel (PLAVIX) 75 MG tablet Take 1 tablet (75 mg total) by mouth daily with breakfast. 30 tablet 2 Taking  . Cyanocobalamin (VITAMIN B 12 PO) Take 1,000 mcg by mouth daily.    Taking  . gabapentin (NEURONTIN) 300 MG capsule TAKE 1 CAPSULE BY MOUTH TWICE A DAY (Patient taking differently: Take 300 mg by mouth 2 (two) times daily. ) 60 capsule 5 Taking  . isosorbide mononitrate (IMDUR) 120 MG 24 hr tablet Take 1 tablet (120 mg total) by mouth daily. 90 tablet 1 Taking  . levothyroxine (SYNTHROID, LEVOTHROID) 25 MCG tablet Take 1 tablet (25 mcg total) daily before breakfast by mouth. 30 tablet 12 Taking  . losartan (COZAAR) 25 MG tablet Take 1/2 tablet (12.5 mg) by mouth once daily (Patient taking differently: Take 12.5 mg by mouth daily. ) 45 tablet 1 Taking  . metFORMIN (GLUCOPHAGE-XR) 500 MG 24 hr tablet Take 500 mg by mouth daily with breakfast.   Taking  . metoprolol succinate (TOPROL-XL) 50 MG 24 hr tablet Take 1 tablet (50 mg total) by mouth daily. Take with or immediately following a meal. (Patient taking differently: Take 100 mg by mouth daily. Take with or immediately following a meal.) 30 tablet 3 Taking  . nitroGLYCERIN (NITROSTAT) 0.4 MG SL tablet Place 1 tablet (0.4 mg total) under the tongue every 5 (five) minutes as needed for chest pain. 25 tablet 2 Taking  . pantoprazole (PROTONIX) 40 MG tablet Take 1 tablet (40 mg total) by mouth 2 (two) times daily. 60 tablet 5   . promethazine (PHENERGAN) 25 MG tablet Take 1 tablet (25 mg total) by mouth every 6 (six) hours as needed for nausea or vomiting. 30 tablet 3   . pyridostigmine (MESTINON) 60 MG tablet Take 60 mg by mouth 3 (three) times daily.    Taking  . rosuvastatin (CRESTOR) 40 MG tablet Take 1 tablet (40 mg total) by mouth daily. 90 tablet 3   .  zolpidem (AMBIEN) 5 MG tablet Take 1 tablet (5 mg total) by mouth at bedtime as needed for up to 7 days for sleep. 7 tablet 0     Patient Stressors: Financial difficulties Health  problems  Patient Strengths: Ability for insight General fund of knowledge Supportive family/friends  Treatment Modalities: Medication Management, Group therapy, Case management,  1 to 1 session with clinician, Psychoeducation, Recreational therapy.   Physician Treatment Plan for Primary Diagnosis: Acute delirium Long Term Goal(s): Improvement in symptoms so as ready for discharge Improvement in symptoms so as ready for discharge   Short Term Goals: Ability to identify changes in lifestyle to reduce recurrence of condition will improve Ability to verbalize feelings will improve Compliance with prescribed medications will improve  Medication Management: Evaluate patient's response, side effects, and tolerance of medication regimen.  Therapeutic Interventions: 1 to 1 sessions, Unit Group sessions and Medication administration.  Evaluation of Outcomes: Adequate for Discharge  Physician Treatment Plan for Secondary Diagnosis: Principal Problem:   Acute delirium Active Problems:   Essential hypertension   Neuropathy (HCC)   Hyperlipidemia   GERD (gastroesophageal reflux disease)   Adult hypothyroidism   Multiple sclerosis (HCC)   Major depressive disorder, recurrent, severe with psychotic features ([place])   Vitamin B12 deficiency   Insomnia   H/O gastric bypass  Long Term Goal(s): Improvement in symptoms so as ready for discharge Improvement in symptoms so as ready for discharge   Short Term Goals: Ability to identify changes in lifestyle to reduce recurrence of condition will improve Ability to verbalize feelings will improve Compliance with prescribed medications will improve     Medication Management: Evaluate patient's response, side effects, and tolerance of medication regimen.  Therapeutic  Interventions: 1 to 1 sessions, Unit Group sessions and Medication administration.  Evaluation of Outcomes: Adequate for Discharge   RN Treatment Plan for Primary Diagnosis: Acute delirium Long Term Goal(s): Knowledge of disease and therapeutic regimen to maintain health will improve  Short Term Goals: Ability to demonstrate self-control, Ability to participate in decision making will improve and Compliance with prescribed medications will improve  Medication Management: RN will administer medications as ordered by provider, will assess and evaluate patient's response and provide education to patient for prescribed medication. RN will report any adverse and/or side effects to prescribing provider.  Therapeutic Interventions: 1 on 1 counseling sessions, Psychoeducation, Medication administration, Evaluate responses to treatment, Monitor vital signs and CBGs as ordered, Perform/monitor CIWA, COWS, AIMS and Fall Risk screenings as ordered, Perform wound care treatments as ordered.  Evaluation of Outcomes: Adequate for Discharge   LCSW Treatment Plan for Primary Diagnosis: Acute delirium Long Term Goal(s): Safe transition to appropriate next level of care at discharge, Engage patient in therapeutic group addressing interpersonal concerns.  Short Term Goals: Engage patient in aftercare planning with referrals and resources, Increase social support and Increase skills for wellness and recovery  Therapeutic Interventions: Assess for all discharge needs, 1 to 1 time with Social worker, Explore available resources and support systems, Assess for adequacy in community support network, Educate family and significant other(s) on suicide prevention, Complete Psychosocial Assessment, Interpersonal group therapy.  Evaluation of Outcomes: Adequate for Discharge   Progress in Treatment: Attending groups: Yes. Participating in groups: Yes. Taking medication as prescribed: Yes. Toleration medication:  Yes. Family/Significant other contact made: Yes, individual(s) contacted:  Son Marny Lowenstein Patient understands diagnosis: Yes. Discussing patient identified problems/goals with staff: Yes. Medical problems stabilized or resolved: Yes. Denies suicidal/homicidal ideation: Yes. Issues/concerns per patient self-inventory: No. Other: n/a  New problem(s) identified: nonw  New Short Term/Long Term Goal(s): medication management for mood stabilization; development of comprehensive mental wellness/sobriety plan.   Patient Goals:  "To get better and go home  to my son"  Discharge Plan or Barriers: none identified  Reason for Continuation of Hospitalization: none  Estimated Length of Stay: Today 03/04/18  Attendees: Patient: [name] 03/04/2018 1:58 PM  Physician: Dr Weber Cooks, MD 03/04/2018 1:58 PM  Nursing:  03/04/2018 1:58 PM  RN Care Manager: 03/04/2018 1:58 PM  Social Worker: Minette Brine Maize Brittingham LCSW 03/04/2018 1:58 PM  Recreational Therapist: Roanna Epley CTRS LRT 03/04/2018 1:58 PM  Other:  03/04/2018 1:58 PM  Other:  03/04/2018 1:58 PM  Other: 03/04/2018 1:58 PM    Scribe for Treatment Team: Mariann Laster Ardell Aaronson, LCSW 03/04/2018 1:58 PM

## 2018-03-04 NOTE — Care Management (Signed)
[facility] for [facility]. MD updated.

## 2018-03-04 NOTE — Progress Notes (Signed)
Patient denies SI/HI, denies A/V hallucinations. Patient verbalizes understanding of discharge instructions, follow up care and prescriptions. Patient given all belongings from [facility] locker. Patient escorted out by staff, transported by family. 

## 2018-03-06 DIAGNOSIS — I129 Hypertensive chronic kidney disease with stage 1 through stage 4 chronic kidney disease, or unspecified chronic kidney disease: Secondary | ICD-10-CM | POA: Diagnosis not present

## 2018-03-06 DIAGNOSIS — Z955 Presence of coronary angioplasty implant and graft: Secondary | ICD-10-CM | POA: Diagnosis not present

## 2018-03-06 DIAGNOSIS — N183 Chronic kidney disease, stage 3 (moderate): Secondary | ICD-10-CM | POA: Diagnosis not present

## 2018-03-06 DIAGNOSIS — E1122 Type 2 diabetes mellitus with diabetic chronic kidney disease: Secondary | ICD-10-CM | POA: Diagnosis not present

## 2018-03-06 DIAGNOSIS — I69354 Hemiplegia and hemiparesis following cerebral infarction affecting left non-dominant side: Secondary | ICD-10-CM | POA: Diagnosis not present

## 2018-03-06 DIAGNOSIS — I2510 Atherosclerotic heart disease of native coronary artery without angina pectoris: Secondary | ICD-10-CM | POA: Diagnosis not present

## 2018-03-07 ENCOUNTER — Telehealth: Payer: Self-pay | Admitting: Family Medicine

## 2018-03-07 DIAGNOSIS — F331 Major depressive disorder, recurrent, moderate: Secondary | ICD-10-CM | POA: Diagnosis not present

## 2018-03-07 LAB — GLUCOSE, CAPILLARY: Glucose-Capillary: 16 mg/dL — CL (ref 70–99)

## 2018-03-07 NOTE — Telephone Encounter (Signed)
That's fine

## 2018-03-07 NOTE — Telephone Encounter (Signed)
Patient was re-admitted to hospital and now out again.   [name] went back to see her for PT over the weekend.  She needs verbal ok for  2 week one, 1 week 2.   Leave message confirming its ok.

## 2018-03-07 NOTE — Telephone Encounter (Signed)
Erline Levine advised.   Thanks,   -Mickel Baas

## 2018-03-08 DIAGNOSIS — Z9884 Bariatric surgery status: Secondary | ICD-10-CM | POA: Diagnosis not present

## 2018-03-08 DIAGNOSIS — E669 Obesity, unspecified: Secondary | ICD-10-CM | POA: Diagnosis not present

## 2018-03-08 DIAGNOSIS — E039 Hypothyroidism, unspecified: Secondary | ICD-10-CM | POA: Diagnosis not present

## 2018-03-08 DIAGNOSIS — E1142 Type 2 diabetes mellitus with diabetic polyneuropathy: Secondary | ICD-10-CM | POA: Diagnosis not present

## 2018-03-08 DIAGNOSIS — I129 Hypertensive chronic kidney disease with stage 1 through stage 4 chronic kidney disease, or unspecified chronic kidney disease: Secondary | ICD-10-CM | POA: Diagnosis not present

## 2018-03-08 DIAGNOSIS — E782 Mixed hyperlipidemia: Secondary | ICD-10-CM | POA: Diagnosis not present

## 2018-03-08 DIAGNOSIS — E1122 Type 2 diabetes mellitus with diabetic chronic kidney disease: Secondary | ICD-10-CM | POA: Diagnosis not present

## 2018-03-08 DIAGNOSIS — N183 Chronic kidney disease, stage 3 (moderate): Secondary | ICD-10-CM | POA: Diagnosis not present

## 2018-03-10 DIAGNOSIS — I2510 Atherosclerotic heart disease of native coronary artery without angina pectoris: Secondary | ICD-10-CM | POA: Diagnosis not present

## 2018-03-10 DIAGNOSIS — Z955 Presence of coronary angioplasty implant and graft: Secondary | ICD-10-CM | POA: Diagnosis not present

## 2018-03-10 DIAGNOSIS — N183 Chronic kidney disease, stage 3 (moderate): Secondary | ICD-10-CM | POA: Diagnosis not present

## 2018-03-10 DIAGNOSIS — E1122 Type 2 diabetes mellitus with diabetic chronic kidney disease: Secondary | ICD-10-CM | POA: Diagnosis not present

## 2018-03-10 DIAGNOSIS — I129 Hypertensive chronic kidney disease with stage 1 through stage 4 chronic kidney disease, or unspecified chronic kidney disease: Secondary | ICD-10-CM | POA: Diagnosis not present

## 2018-03-10 DIAGNOSIS — I69354 Hemiplegia and hemiparesis following cerebral infarction affecting left non-dominant side: Secondary | ICD-10-CM | POA: Diagnosis not present

## 2018-03-16 ENCOUNTER — Emergency Department: Payer: Medicare Other

## 2018-03-16 ENCOUNTER — Emergency Department
Admission: EM | Admit: 2018-03-16 | Discharge: 2018-03-16 | Disposition: A | Discharge status: Home / Self Care | Payer: Medicare Other | Attending: Emergency Medicine | Admitting: Emergency Medicine

## 2018-03-16 ENCOUNTER — Encounter: Payer: Self-pay | Admitting: Emergency Medicine

## 2018-03-16 ENCOUNTER — Telehealth: Payer: Self-pay | Admitting: Cardiovascular Disease

## 2018-03-16 ENCOUNTER — Other Ambulatory Visit: Payer: Self-pay

## 2018-03-16 DIAGNOSIS — E039 Hypothyroidism, unspecified: Secondary | ICD-10-CM | POA: Insufficient documentation

## 2018-03-16 DIAGNOSIS — Z79899 Other long term (current) drug therapy: Secondary | ICD-10-CM | POA: Diagnosis not present

## 2018-03-16 DIAGNOSIS — R079 Chest pain, unspecified: Secondary | ICD-10-CM | POA: Diagnosis not present

## 2018-03-16 DIAGNOSIS — I25118 Atherosclerotic heart disease of native coronary artery with other forms of angina pectoris: Secondary | ICD-10-CM | POA: Insufficient documentation

## 2018-03-16 DIAGNOSIS — R42 Dizziness and giddiness: Secondary | ICD-10-CM | POA: Diagnosis not present

## 2018-03-16 DIAGNOSIS — Z7902 Long term (current) use of antithrombotics/antiplatelets: Secondary | ICD-10-CM | POA: Insufficient documentation

## 2018-03-16 DIAGNOSIS — N184 Chronic kidney disease, stage 4 (severe): Secondary | ICD-10-CM | POA: Insufficient documentation

## 2018-03-16 DIAGNOSIS — I130 Hypertensive heart and chronic kidney disease with heart failure and stage 1 through stage 4 chronic kidney disease, or unspecified chronic kidney disease: Secondary | ICD-10-CM | POA: Insufficient documentation

## 2018-03-16 DIAGNOSIS — I959 Hypotension, unspecified: Secondary | ICD-10-CM | POA: Diagnosis not present

## 2018-03-16 DIAGNOSIS — E1122 Type 2 diabetes mellitus with diabetic chronic kidney disease: Secondary | ICD-10-CM | POA: Insufficient documentation

## 2018-03-16 DIAGNOSIS — Z7982 Long term (current) use of aspirin: Secondary | ICD-10-CM | POA: Insufficient documentation

## 2018-03-16 DIAGNOSIS — I5022 Chronic systolic (congestive) heart failure: Secondary | ICD-10-CM | POA: Diagnosis not present

## 2018-03-16 DIAGNOSIS — Z87891 Personal history of nicotine dependence: Secondary | ICD-10-CM | POA: Insufficient documentation

## 2018-03-16 DIAGNOSIS — R55 Syncope and collapse: Secondary | ICD-10-CM | POA: Diagnosis not present

## 2018-03-16 LAB — CBC WITH DIFFERENTIAL/PLATELET
Abs Immature Granulocytes: 0.02 10*3/uL (ref 0.00–0.07)
Basophils Absolute: 0.1 10*3/uL (ref 0.0–0.1)
Basophils Relative: 1 %
EOS PCT: 2 %
Eosinophils Absolute: 0.2 10*3/uL (ref 0.0–0.5)
HCT: 28.9 % — ABNORMAL LOW (ref 36.0–46.0)
HEMOGLOBIN: 9.2 g/dL — AB (ref 12.0–15.0)
Immature Granulocytes: 0 %
LYMPHS PCT: 23 %
Lymphs Abs: 2.1 10*3/uL (ref 0.7–4.0)
MCH: 25.3 pg — ABNORMAL LOW (ref 26.0–34.0)
MCHC: 31.8 g/dL (ref 30.0–36.0)
MCV: 79.6 fL — ABNORMAL LOW (ref 80.0–100.0)
Monocytes Absolute: 0.6 10*3/uL (ref 0.1–1.0)
Monocytes Relative: 6 %
Neutro Abs: 6.2 10*3/uL (ref 1.7–7.7)
Neutrophils Relative %: 68 %
Platelets: 255 10*3/uL (ref 150–400)
RBC: 3.63 MIL/uL — ABNORMAL LOW (ref 3.87–5.11)
RDW: 13.2 % (ref 11.5–15.5)
WBC: 9.2 10*3/uL (ref 4.0–10.5)
nRBC: 0 % (ref 0.0–0.2)

## 2018-03-16 LAB — BASIC METABOLIC PANEL
Anion gap: 10 (ref 5–15)
BUN: 18 mg/dL (ref 6–20)
CO2: 21 mmol/L — ABNORMAL LOW (ref 22–32)
Calcium: 8.7 mg/dL — ABNORMAL LOW (ref 8.9–10.3)
Chloride: 103 mmol/L (ref 98–111)
Creatinine, Ser: 2.14 mg/dL — ABNORMAL HIGH (ref 0.44–1.00)
GFR calc Af Amer: 31 mL/min — ABNORMAL LOW (ref >60)
GFR, EST NON AFRICAN AMERICAN: 26 mL/min — AB (ref >60)
Glucose, Bld: 81 mg/dL (ref 70–99)
POTASSIUM: 4.2 mmol/L (ref 3.5–5.1)
Sodium: 134 mmol/L — ABNORMAL LOW (ref 135–145)

## 2018-03-16 LAB — CBC
HCT: 29.1 % — ABNORMAL LOW (ref 36.0–46.0)
Hemoglobin: 9.2 g/dL — ABNORMAL LOW (ref 12.0–15.0)
MCH: 24.9 pg — AB (ref 26.0–34.0)
MCHC: 31.6 g/dL (ref 30.0–36.0)
MCV: 78.9 fL — ABNORMAL LOW (ref 80.0–100.0)
Platelets: 256 10*3/uL (ref 150–400)
RBC: 3.69 MIL/uL — ABNORMAL LOW (ref 3.87–5.11)
RDW: 13.2 % (ref 11.5–15.5)
WBC: 9.4 10*3/uL (ref 4.0–10.5)
nRBC: 0 % (ref 0.0–0.2)

## 2018-03-16 LAB — HEPATIC FUNCTION PANEL
ALT: 25 U/L (ref 0–44)
AST: 31 U/L (ref 15–41)
Albumin: 3.6 g/dL (ref 3.5–5.0)
Alkaline Phosphatase: 112 U/L (ref 38–126)
Bilirubin, Direct: 0.1 mg/dL (ref 0.0–0.2)
Indirect Bilirubin: 0.5 mg/dL (ref 0.3–0.9)
Total Bilirubin: 0.6 mg/dL (ref 0.3–1.2)
Total Protein: 7 g/dL (ref 6.5–8.1)

## 2018-03-16 LAB — TROPONIN I
Troponin I: <0.03 ng/mL (ref <0.03)
Troponin I: <0.03 ng/mL (ref <0.03)

## 2018-03-16 LAB — FIBRIN DERIVATIVES D-DIMER (ARMC ONLY): Fibrin derivatives D-dimer (ARMC): 393.18 ng/mL (FEU) (ref 0.00–499.00)

## 2018-03-16 NOTE — Telephone Encounter (Signed)
STAT if patient feels like he/she is going to faint   1) Are you dizzy now? yes  2) Do you feel faint or have you passed out? yes  3) Do you have any other symptoms? Last night was sweating , and nauseated   4) Have you checked your HR and BP (record if available)? BP yesterday was low

## 2018-03-16 NOTE — ED Notes (Signed)
Patient currently laying in bed resting. Will continue to monitor.

## 2018-03-16 NOTE — Telephone Encounter (Signed)
Given hypotension and complex cardiac history, I agree with ED evaluation.  Nelva Bush, MD [facility] Pager: [phone]

## 2018-03-16 NOTE — ED Notes (Signed)
This Probation officer walked Patient in room. Patient states having slight dizziness when walking but states nothing like earlier in the day before she came to ED. Patient currently laying in bed resting.

## 2018-03-16 NOTE — Telephone Encounter (Signed)
Spoke with patient and she states that every time she gets up she has lightheadedness, dizzy, and just doesn't feel well. She reports it is very hard to explain and has been going on since last Thursday. She also has reports of intermittent nausea. Blood pressures have also been low and she held her meds for 2 days straight. She did take them today and her BP was 72/52 with HR 79. No fever, no throwing up. Reviewed medications and she states that she has been off amlodipine for about a month and stopped the losartan 2 months ago. She did take nitro last night for chest pain and now just doesn't feel well. Confirmed that she is taking isosorbide and metoprolol and she did take dose today. Advised her to go to ED for further evaluation based on her blood pressure, chest pain last night, and strong cardiac history. She verbalized understanding with no further questions at this time.

## 2018-03-16 NOTE — Discharge Instructions (Addendum)
I am not sure what happened today to make your blood pressure drop.  Everything we have checked so far has been good.  Your blood pressure is back to normal now.  I will let you go.  Please return here if you have any further episodes of anything more than very mild dizziness or any chest pain or shortness of breath.  Please have your regular doctor see you tomorrow or see your cardiologist.  If the metoprolol is able to be broken in half to try and take half in the morning half in the evening.  Start that tomorrow morning

## 2018-03-16 NOTE — ED Triage Notes (Signed)
Pt arrives with complaints of dizziness with intermittent chest pain for the last week. Pt reports worse dizziness with movement. Pt hypotensive in triage and appears pale. Pt states the pain in here chest is mid sternum and and feels sharp. Episodes last 5 minutes per pt.

## 2018-03-16 NOTE — ED Provider Notes (Addendum)
[facility] Emergency Department Provider Note   ____________________________________________   First MD Initiated Contact with Patient 03/16/18 1357     (approximate)  I have reviewed the triage vital signs and the nursing notes.   HISTORY  Chief Complaint Dizziness    HPI [place] is a 48 y.o. female patient reports she called her cardiologist told him her blood pressure and was told to come in here.  Here patient is hypotensive with a blood pressure in the 70s.  She is pale.  She reports chest pain lasting 12 minutes at a time coming and going for the last week.  She is increasing shortness of breath with exertion.  Chest pain however does not require exertion and just comes and goes whenever it wants to.  It is not worse with deep breathing and it does seem to make her short of breath though.  Resolves also whenever it wants to nothing she does make it better.   Past Medical History:  Diagnosis Date  . Anemia    iron deficiency anemia  . Aortic arch aneurysm ([place])   . Bipolar disorder ([place])   . BRCA negative 2014  . CAD (coronary artery disease)    a. 08/2003 Cath: LAD 30-40-med Rx; b. 11/2014 PCI: LAD 2m(3.25x23 Xience Alpine DES); c. 06/2015 PCI: D1 (2.25x12 Resolute Integrity DES); d. 06/2017 PCI: Patent mLAD stent, D2 95 (PTCA); e. 09/2017 PCI: D2 99ost (CBA); d. 12/2017 Cath: LM nl, LAD 311m80d (small), D1 40ost, D2 95ost, LCX 40p, RCA 40ost/p->Med rx for D2 given restenosis.  . CKD (chronic kidney disease), stage III (HCShedd  . Colon polyp   . CVA (cerebral vascular accident) (HCSpotswood   Left side weakness.   . Diabetes (HCBelle Center  . Family history of breast cancer    BRCA neg 2014  . Gastric ulcer 04/27/2011  . History of echocardiogram    a. 03/2017 Echo: EF 60-65%, no rwma; b. 02/2018 Echo: EF 60-65%, no rwma. Nl RV fxn. No cardiac source of emboli (admitted w/ stroke).  . Marland KitchenTN (hypertension)   . Hyperlipemia   . Hypothyroid   .  Malignant melanoma of skin of scalp (HCCoopers Plains  . MI, acute, non ST segment elevation (HCPikes [place]  . Neuromuscular disorder (HCValley Bend  . Orthostatic hypotension   . S/P drug eluting coronary stent placement 06/04/2015  . Sepsis (HCKachemak2/14/2019  . Stroke ([facility]   a. 02/2018 MRI: 45m14mate acute/early subacute L medial frontal lobe inarct; b. 02/2018 MRA No large vessel occlusion or aneurysm. Mod to sev L P2 stenosis. thready L vertebral artery, diffusely dzs'd; c. 02/2018 Carotid U/S: <50% bilat ICA dzs.    Patient Active Problem List   Diagnosis Date Noted  . Acute delirium 03/03/2018  . H/O gastric bypass 03/02/2018  . MI, acute, non ST segment elevation (HCCClacks Canyon . Effort angina (HCCPalm Harbor5/28/2019  . Unstable angina (HCCNorth Beach5/23/2019  . Syncope 04/09/2017  . Insomnia 03/18/2017  . Ischemic cardiomyopathy   . Arthritis   . Anxiety   . Acute colitis 01/27/2017  . Hx of colonic polyps   . Bariatric surgery status   . Sinus tachycardia 02/27/2016  . H/O medication noncompliance 12/14/2015  . Acute pyelonephritis   . Emesis   . CKD (chronic kidney disease), stage IV (HCCMcDowell . Atherosclerosis of native coronary artery of native heart with stable angina pectoris (HCCCincinnati . Hypertensive heart disease   .  CKD (chronic kidney disease), stage III ([place]) 06/05/2015  . Presence of drug coated stent in LAD coronary artery & now in D1 06/04/2015  . Colitis 06/03/2015  . Carotid stenosis 04/30/2015  . Type 2 diabetes mellitus with stage 3 chronic kidney disease, without long-term current use of insulin ([place])   . Atypical chest pain   . Stable angina pectoris ([place]) 04/17/2015  . Iron deficiency anemia 03/22/2015  . Vitamin B12 deficiency 02/18/2015  . Misuse of medications for pain 02/18/2015  . Major depressive disorder, recurrent, severe with psychotic features ([place]) 02/15/2015  . Helicobacter pylori infection 11/23/2014  . Hemiparesis, left ([place]) 11/23/2014  . Benign neoplasm of colon 11/20/2014  .  Malignant melanoma ([place]) 08/25/2014  . Chronic systolic CHF (congestive heart failure) ([place])   . Incomplete bladder emptying 07/12/2014  . Adult hypothyroidism 12/30/2013  . Aberrant subclavian artery 11/17/2013  . Multiple sclerosis ([place]) 11/02/2013  . History of CVA (cerebrovascular accident) 06/20/2013  . Headache, migraine 05/29/2013  . Hyperlipidemia   . GERD (gastroesophageal reflux disease)   . Neuropathy ([place]) 01/02/2011  . Stroke ([place]) 06/21/2008  . Essential hypertension 05/01/2008    Past Surgical History:  Procedure Laterality Date  . APPENDECTOMY    . CARDIAC CATHETERIZATION N/A 11/09/2014   Procedure: Coronary Angiography;  Surgeon: Minna Merritts, MD;  Location: [place] [facility];  Service: Cardiovascular;  Laterality: N/A;  . CARDIAC CATHETERIZATION N/A 11/12/2014   Procedure: Coronary Stent Intervention;  Surgeon: Isaias Cowman, MD;  Location: [place] [facility];  Service: Cardiovascular;  Laterality: N/A;  . CARDIAC CATHETERIZATION N/A 04/18/2015   Procedure: Left Heart Cath and Coronary Angiography;  Surgeon: Minna Merritts, MD;  Location: [place] [facility];  Service: Cardiovascular;  Laterality: N/A;  . CARDIAC CATHETERIZATION Left 06/04/2015   Procedure: Left Heart Cath and Coronary Angiography;  Surgeon: Wellington Hampshire, MD;  Location: [place] CV LAB;  Service: Cardiovascular;  Laterality: Left;  . CARDIAC CATHETERIZATION N/A 06/04/2015   Procedure: Coronary Stent Intervention;  Surgeon: Wellington Hampshire, MD;  Location: [place] CV LAB;  Service: Cardiovascular;  Laterality: N/A;  . CESAREAN SECTION  2001  . CHOLECYSTECTOMY N/A 11/18/2016   Procedure: LAPAROSCOPIC CHOLECYSTECTOMY WITH INTRAOPERATIVE CHOLANGIOGRAM;  Surgeon: Christene Lye, MD;  Location: [facility];  Service: General;  Laterality: N/A;  . COLONOSCOPY WITH PROPOFOL N/A 04/27/2016   Procedure: COLONOSCOPY WITH PROPOFOL;  Surgeon: [name], MD;  Location: [place];  Service: Endoscopy;  Laterality: N/A;  . COLONOSCOPY WITH PROPOFOL N/A 01/12/2018   Procedure: COLONOSCOPY WITH PROPOFOL;  Surgeon: Toledo, Benay Pike, MD;  Location: [facility] ENDOSCOPY;  Service: Endoscopy;  Laterality: N/A;  . CORONARY ANGIOPLASTY    . CORONARY BALLOON ANGIOPLASTY N/A 06/29/2017   Procedure: CORONARY BALLOON ANGIOPLASTY;  Surgeon: Wellington Hampshire, MD;  Location: [place] CV LAB;  Service: Cardiovascular;  Laterality: N/A;  . CORONARY BALLOON ANGIOPLASTY N/A 09/20/2017   Procedure: CORONARY BALLOON ANGIOPLASTY;  Surgeon: Wellington Hampshire, MD;  Location: [place] CV LAB;  Service: Cardiovascular;  Laterality: N/A;  . DILATION AND CURETTAGE OF UTERUS    . ESOPHAGOGASTRODUODENOSCOPY (EGD) WITH PROPOFOL N/A 09/14/2014   Procedure: ESOPHAGOGASTRODUODENOSCOPY (EGD) WITH PROPOFOL;  Surgeon: Josefine Class, MD;  Location[facility];  Service: Endoscopy;  Laterality: N/A;  . ESOPHAGOGASTRODUODENOSCOPY (EGD) WITH PROPOFOL N/A 04/27/2016   Procedure: ESOPHAGOGASTRODUODENOSCOPY (EGD) WITH PROPOFOL;  Surgeon: [name], MD;  Location: [place];  Service: Endoscopy;  Laterality: N/A;  Diabetic -  oral meds  . ESOPHAGOGASTRODUODENOSCOPY (EGD) WITH PROPOFOL N/A 01/12/2018   Procedure: ESOPHAGOGASTRODUODENOSCOPY (EGD) WITH PROPOFOL;  Surgeon: Toledo, Benay Pike, MD;  Location: [facility] ENDOSCOPY;  Service: Endoscopy;  Laterality: N/A;  . GASTRIC BYPASS  09/2009   [facility]   . Left Carotid to sublcavian artery bypass w/ subclavian artery ligation     a. Performed @ [place].  . LEFT HEART CATH AND CORONARY ANGIOGRAPHY Left 06/29/2017   Procedure: LEFT HEART CATH AND CORONARY ANGIOGRAPHY;  Surgeon: Wellington Hampshire, MD;  Location: [place] CV LAB;  Service: Cardiovascular;  Laterality: Left;  . LEFT HEART CATH AND CORONARY ANGIOGRAPHY N/A 09/20/2017   Procedure: LEFT HEART CATH AND CORONARY ANGIOGRAPHY;  Surgeon: Wellington Hampshire, MD;   Location: [place] [facility] LAB;  Service: Cardiovascular;  Laterality: N/A;  . LEFT HEART CATH AND CORONARY ANGIOGRAPHY N/A 12/20/2017   Procedure: LEFT HEART CATH AND CORONARY ANGIOGRAPHY;  Surgeon: Wellington Hampshire, MD;  Location: [place] [facility] LAB;  Service: Cardiovascular;  Laterality: N/A;  . MELANOMA EXCISION  2016   Dr. Evorn Gong  . [place]  2002  . RIGHT OOPHORECTOMY    . SHOULDER ARTHROSCOPY WITH OPEN ROTATOR CUFF REPAIR Right 01/07/2016   Procedure: SHOULDER ARTHROSCOPY WITH DEBRIDMENT, SUBACHROMIAL DECOMPRESSION;  Surgeon: Corky Mull, MD;  Location: [facility];  Service: Orthopedics;  Laterality: Right;  . SHOULDER ARTHROSCOPY WITH OPEN ROTATOR CUFF REPAIR Right 03/16/2017   Procedure: SHOULDER ARTHROSCOPY WITH OPEN ROTATOR CUFF REPAIR POSSIBLE BICEPS TENODESIS;  Surgeon: Corky Mull, MD;  Location: [facility];  Service: Orthopedics;  Laterality: Right;  . TRIGGER FINGER RELEASE Right     [place]    Prior to Admission medications   Medication Sig Start Date End Date Taking? Authorizing Provider  ALPRAZolam Duanne Moron) 1 MG tablet Take 1 tablet (1 mg total) by mouth at bedtime. 03/04/18   Clapacs, Madie Reno, MD  amLODipine (NORVASC) 5 MG tablet Take 1 tablet (5 mg total) by mouth daily. 11/29/17   Minna Merritts, MD  aspirin 81 MG chewable tablet Chew 1 tablet (81 mg total) by mouth daily. Patient taking differently: Chew 325 mg by mouth daily.  07/01/17   Dunn, Areta Haber, PA-C  Blood Glucose Monitoring Suppl (ONE TOUCH ULTRA 2) w/Device KIT Use to check blood sugar once a day. Dx. E11.9 03/18/17   [name], MD  buPROPion (WELLBUTRIN XL) 150 MG 24 hr tablet TAKE 1 TABLET BY MOUTH DAILY 08/10/17   [name], MD  citalopram (CELEXA) 40 MG tablet Take 1 tablet (40 mg total) by mouth daily. 06/08/17   [name], MD  clopidogrel (PLAVIX) 75 MG tablet Take 1 tablet (75 mg total) by mouth daily with breakfast. 07/01/17   Dunn, Areta Haber, PA-C  Cyanocobalamin (VITAMIN B  12 PO) Take 1,000 mcg by mouth daily.     [provider]  gabapentin (NEURONTIN) 300 MG capsule TAKE 1 CAPSULE BY MOUTH TWICE A DAY Patient taking differently: Take 300 mg by mouth 2 (two) times daily.  10/30/17   [name], MD  isosorbide mononitrate (IMDUR) 120 MG 24 hr tablet Take 1 tablet (120 mg total) by mouth daily. 10/13/17   Dunn, Areta Haber, PA-C  levothyroxine (SYNTHROID, LEVOTHROID) 25 MCG tablet Take 1 tablet (25 mcg total) daily before breakfast by mouth. 12/18/16   [name], MD  losartan (COZAAR) 25 MG tablet Take 1/2 tablet (12.5 mg) by mouth once daily Patient taking  differently: Take 12.5 mg by mouth daily.  10/13/17   Dunn, Areta Haber, PA-C  metoprolol succinate (TOPROL-XL) 50 MG 24 hr tablet Take 1 tablet (50 mg total) by mouth daily. Take with or immediately following a meal. Patient taking differently: Take 100 mg by mouth daily. Take with or immediately following a meal. 07/01/17   Dunn, Areta Haber, PA-C  nitroGLYCERIN (NITROSTAT) 0.4 MG SL tablet Place 1 tablet (0.4 mg total) under the tongue every 5 (five) minutes as needed for chest pain. 06/02/17   Minna Merritts, MD  pantoprazole (PROTONIX) 40 MG tablet Take 1 tablet (40 mg total) by mouth 2 (two) times daily. 03/02/18   [name], MD  pyridostigmine (MESTINON) 60 MG tablet Take 60 mg by mouth 3 (three) times daily.  07/26/17   [provider]  rosuvastatin (CRESTOR) 40 MG tablet Take 1 tablet (40 mg total) by mouth daily. 03/01/18 05/30/18  Theora Gianotti, NP    Allergies Lipitor [atorvastatin] and Tramadol  Family History  Problem Relation Age of Onset  . Hypertension Mother   . Anxiety disorder Mother   . Depression Mother   . Bipolar disorder Mother   . Heart disease Mother        No details  . Hyperlipidemia Mother   . Kidney disease Father   . Heart disease Father 54  . Hypertension Father   . Diabetes Father   . Stroke Father   . Colon cancer Father        dx in his  80's  . Anxiety disorder Father   . Depression Father   . Skin cancer Father   . Kidney disease Sister   . Thyroid nodules Sister   . Hypertension Sister   . Hypertension Sister   . Diabetes Sister   . Hyperlipidemia Sister   . Depression Sister   . Breast cancer Maternal Aunt 68  . Breast cancer Maternal Aunt 74  . Ovarian cancer Cousin   . Colon cancer Cousin   . Breast cancer Other   . Kidney cancer Neg Hx   . Bladder Cancer Neg Hx     Social History Social History   Tobacco Use  . Smoking status: Former Smoker    Types: Cigarettes    Last attempt to quit: 08/31/1994    Years since quitting: 23.5  . Smokeless tobacco: Never Used  . Tobacco comment: quit 28 years ago  Substance Use Topics  . Alcohol use: No    Alcohol/week: 0.0 standard drinks  . Drug use: No    Review of Systems  Constitutional: No fever/chills Eyes: No visual changes. ENT: No sore throat. Cardiovascular: Intermittent chest pain. Respiratory: Intermittent shortness of breath. Gastrointestinal: No abdominal pain.  No nausea, no vomiting.  No diarrhea.  No constipation. Genitourinary: Negative for dysuria. Musculoskeletal: Negative for back pain. Skin: Negative for rash. Neurological: Negative for headaches, focal weakness   ____________________________________________   PHYSICAL EXAM:  VITAL SIGNS: ED Triage Vitals  Enc Vitals Group     BP 03/16/18 1352 (!) 77/50     Pulse Rate 03/16/18 1352 63     Resp 03/16/18 1352 18     Temp 03/16/18 1352 98.2 F (36.8 C)     Temp Source 03/16/18 1352 Oral     SpO2 03/16/18 1352 99 %     Weight 03/16/18 1353 163 lb (73.9 kg)     Height 03/16/18 1353 _0  (1.575 m)     Head Circumference --  Peak Flow --      Pain Score 03/16/18 1353 0     Pain Loc --      Pain Edu? --      Excl. in [place]? --     Constitutional: Alert and oriented. Well appearing and in no acute distress. Eyes: Conjunctivae are normal. PERRL. EOMI. Head:  Atraumatic. Nose: No congestion/rhinnorhea. Mouth/Throat: Mucous membranes are moist.  Oropharynx non-erythematous. Neck: No stridor.  Cardiovascular: Normal rate, regular rhythm. Grossly normal heart sounds.  Good peripheral circulation. Respiratory: Normal respiratory effort.  No retractions. Lungs CTAB. Gastrointestinal: Soft and nontender. No distention. No abdominal bruits. No CVA tenderness. Musculoskeletal: No lower extremity tenderness nor edema. Neurologic:  Normal speech and language. No gross focal neurologic deficits are appreciated. Skin:  Skin is warm, dry and intact. No rash noted. Psychiatric: Mood and affect are normal. Speech and behavior are normal.  ____________________________________________   LABS (all labs ordered are listed, but only abnormal results are displayed)  Labs Reviewed  BASIC METABOLIC PANEL - Abnormal; Notable for the following components:      Result Value   Sodium 134 (*)    CO2 21 (*)    Creatinine, Ser 2.14 (*)    Calcium 8.7 (*)    GFR calc non Af Amer 26 (*)    GFR calc Af Amer 31 (*)    All other components within normal limits  CBC - Abnormal; Notable for the following components:   RBC 3.69 (*)    Hemoglobin 9.2 (*)    HCT 29.1 (*)    MCV 78.9 (*)    MCH 24.9 (*)    All other components within normal limits  CBC WITH DIFFERENTIAL/PLATELET - Abnormal; Notable for the following components:   RBC 3.63 (*)    Hemoglobin 9.2 (*)    HCT 28.9 (*)    MCV 79.6 (*)    MCH 25.3 (*)    All other components within normal limits  TROPONIN I  HEPATIC FUNCTION PANEL  FIBRIN DERIVATIVES D-DIMER ([facility] ONLY)  TROPONIN I   ____________________________________________  EKG  EKG read and interpreted by me shows normal sinus rhythm rate of 75 normal axis no acute ST-T wave changes computer is reading prolonged QT and calculates QTC at 495 ms.  EKG repeated due to chest pain.  EKG read interpreted by me shows sinus bradycardia rate of 51  normal axis no acute ST-T wave changes ____________________________________________  RADIOLOGY  ED MD interpretation:   Official radiology report(s): Dg Chest Portable 1 View  Result Date: 03/16/2018 CLINICAL DATA:  Chest pain and dizziness EXAM: PORTABLE CHEST 1 VIEW COMPARISON:  February 17, 2018. FINDINGS: There is no edema or consolidation. Heart is upper normal in size with pulmonary vascularity normal. No adenopathy. No pneumothorax. There is postoperative change in the left paratracheal region. No bone lesions. IMPRESSION: No edema or consolidation.  Stable cardiac silhouette. Electronically Signed   By: Lowella Grip III M.D.   On: 03/16/2018 14:16    ____________________________________________   PROCEDURES  Procedure(s) performed:   Procedures  Critical Care performed:   ____________________________________________   INITIAL IMPRESSION / ASSESSMENT AND PLAN / ED COURSE   ----------------------------------------- 5:46 PM on 03/16/2018 -----------------------------------------  Patient is now feeling better.  She still bradycardic in the 50s troponins negative x2 d-dimer is negative not sure what happened.  Discussed with cardiology and hospitalist.  Neither 1 of them have any more suggestions at what to do with the patient.  He are both  okay with the patient going home.  Patient herself also wants to go home.  I will let her go she will return with any further problems and follow-up with her primary care doctor and cardiologist.         ____________________________________________   FINAL CLINICAL IMPRESSION(S) / ED DIAGNOSES  Final diagnoses:  Postural dizziness with near syncope  Hypotension, unspecified hypotension type     ED Discharge Orders    None       Note:  This document was prepared using Dragon voice recognition software and may include unintentional dictation errors.    Nena Polio, MD 03/16/18 1747    Nena Polio,  MD 03/16/18 [phone]

## 2018-03-17 ENCOUNTER — Telehealth: Payer: Self-pay | Admitting: Family Medicine

## 2018-03-17 DIAGNOSIS — E1122 Type 2 diabetes mellitus with diabetic chronic kidney disease: Secondary | ICD-10-CM | POA: Diagnosis not present

## 2018-03-17 DIAGNOSIS — I69354 Hemiplegia and hemiparesis following cerebral infarction affecting left non-dominant side: Secondary | ICD-10-CM | POA: Diagnosis not present

## 2018-03-17 DIAGNOSIS — I2510 Atherosclerotic heart disease of native coronary artery without angina pectoris: Secondary | ICD-10-CM | POA: Diagnosis not present

## 2018-03-17 DIAGNOSIS — I129 Hypertensive chronic kidney disease with stage 1 through stage 4 chronic kidney disease, or unspecified chronic kidney disease: Secondary | ICD-10-CM | POA: Diagnosis not present

## 2018-03-17 DIAGNOSIS — Z955 Presence of coronary angioplasty implant and graft: Secondary | ICD-10-CM | POA: Diagnosis not present

## 2018-03-17 DIAGNOSIS — N183 Chronic kidney disease, stage 3 (moderate): Secondary | ICD-10-CM | POA: Diagnosis not present

## 2018-03-17 NOTE — Telephone Encounter (Signed)
Please verify how much metoprolol she is taking. Our med list says she is taking 100mg  metoprolol a day, but prescription is for 50 a day. She needs to reduce dose in half of what she is taking due to low BP and heart rate, and follow up next week.

## 2018-03-17 NOTE — Telephone Encounter (Signed)
Darleene Cleaver w/ [place] - [phone]  Pt today: Low heart rate 54  Pt Yesterday: Fall last night coming back from ER with an abrasion to left knee - no other injures  Went to ER having low bp - dizzy - was advised to go to ER  Low heart rate in ER  Pt is to f/u with cardiology in next 2 weeks.  Please advise.  Thanks, [employer]

## 2018-03-18 NOTE — Telephone Encounter (Signed)
Pt states she is currently taking Metoprolol 50mg  twice a day.  Dr. [name] suggested to go back up the 100mg  secondary to increased tremors.  The hospitalist suggested to take 50mg  twice a day, pt has been doing that since coming home from the hospital.  I advised her to go back to 50mg  a day and schedule a follow up visit for 03/24/2018 at 8am.   Thanks,   Mickel Baas

## 2018-03-18 NOTE — Telephone Encounter (Signed)
Agree, only take 50mg  a day.

## 2018-03-22 DIAGNOSIS — I129 Hypertensive chronic kidney disease with stage 1 through stage 4 chronic kidney disease, or unspecified chronic kidney disease: Secondary | ICD-10-CM | POA: Diagnosis not present

## 2018-03-22 DIAGNOSIS — I2510 Atherosclerotic heart disease of native coronary artery without angina pectoris: Secondary | ICD-10-CM | POA: Diagnosis not present

## 2018-03-22 DIAGNOSIS — Z955 Presence of coronary angioplasty implant and graft: Secondary | ICD-10-CM | POA: Diagnosis not present

## 2018-03-22 DIAGNOSIS — I69354 Hemiplegia and hemiparesis following cerebral infarction affecting left non-dominant side: Secondary | ICD-10-CM | POA: Diagnosis not present

## 2018-03-22 DIAGNOSIS — N183 Chronic kidney disease, stage 3 (moderate): Secondary | ICD-10-CM | POA: Diagnosis not present

## 2018-03-22 DIAGNOSIS — E1122 Type 2 diabetes mellitus with diabetic chronic kidney disease: Secondary | ICD-10-CM | POA: Diagnosis not present

## 2018-03-24 ENCOUNTER — Encounter: Payer: Self-pay | Admitting: Family Medicine

## 2018-03-24 ENCOUNTER — Ambulatory Visit (INDEPENDENT_AMBULATORY_CARE_PROVIDER_SITE_OTHER): Payer: Medicare Other | Admitting: Family Medicine

## 2018-03-24 VITALS — BP 140/84 | HR 80 | Temp 98.0°F | Wt 183.0 lb

## 2018-03-24 DIAGNOSIS — E785 Hyperlipidemia, unspecified: Secondary | ICD-10-CM

## 2018-03-24 DIAGNOSIS — I639 Cerebral infarction, unspecified: Secondary | ICD-10-CM | POA: Diagnosis not present

## 2018-03-24 DIAGNOSIS — R0989 Other specified symptoms and signs involving the circulatory and respiratory systems: Secondary | ICD-10-CM | POA: Diagnosis not present

## 2018-03-24 DIAGNOSIS — I25118 Atherosclerotic heart disease of native coronary artery with other forms of angina pectoris: Secondary | ICD-10-CM

## 2018-03-24 DIAGNOSIS — G909 Disorder of the autonomic nervous system, unspecified: Secondary | ICD-10-CM

## 2018-03-24 MED ORDER — METOPROLOL SUCCINATE ER 100 MG PO TB24: ORAL_TABLET | ORAL | 0 refills | Status: DC

## 2018-03-24 NOTE — Progress Notes (Signed)
Patient: [name] Female    DOB: [birth year]   48 y.o.   MRN: [id number] Visit Date: 03/24/2018  Today's Provider: [name], MD   Chief Complaint  Patient presents with  . Follow-up    ER Follow up  . Hypertension   Subjective:     HPI    Follow up ER visit  Patient was seen in ER for Dizziness on 03/16/2018. She was treated for Hypotension, postural dizziness. Treatment for this included reducing Metoprolol to 37m a day, which had been increased to 1039ma day in the last few months.   She is also noted to have been stared on pyridostigmine a few months ago [facility]. She states this has greatly helped with he leg pain, but she does not take 3 times a day every day consistently.  She reports excellent compliance with treatment. She reports this condition is Improved.    ------------------------------------------------------------------------------------   Hypertension, follow-up:  BP Readings from Last 3 Encounters:  03/24/18 (!) 157/80  03/16/18 (!) 147/75  03/02/18 (!) 118/53    She was last seen for hypertension 3 months ago.  BP at that visit was 132/96. Management since that visit includes Decreased metoprolol to 5081m day. She reports excellent compliance with treatment. She is not having side effects.  She is exercising. She is adherent to low salt diet.   Outside blood pressures are being checked.  Pt states her blood pressure has been running high the last few days.  (148-167/90-94). She is experiencing fatigue.  Patient denies chest pain, dyspnea, palpitations and syncope.   Cardiovascular risk factors include diabetes mellitus, dyslipidemia, hypertension and obesity (BMI >= 30 kg/m2).  Use of agents associated with hypertension: none.     Weight trend: stable Wt Readings from Last 3 Encounters:  03/24/18 183 lb (83 kg)  03/16/18 163 lb (73.9 kg)  03/01/18 179 lb (81.2 kg)      Current diet: in general, a "healthy" diet    ------------------------------------------------------------------------    Allergies  Allergen Reactions  . Lipitor [Atorvastatin] Other (See Comments)    Leg pains  . Tramadol Other (See Comments)    Mouth feels like it's on fire     Current Outpatient Medications:  .  ALPRAZolam (XANAX) 1 MG tablet, Take 1 tablet (1 mg total) by mouth at bedtime., Disp: 14 tablet, Rfl: 0 .  aspirin 81 MG chewable tablet, Chew 1 tablet (81 mg total) by mouth daily., Disp: 30 tablet, Rfl: 11 .  Blood Glucose Monitoring Suppl (ONE TOUCH ULTRA 2) w/Device KIT, Use to check blood sugar once a day. Dx. E11.9, Disp: 1 each, Rfl: 0 .  buPROPion (WELLBUTRIN XL) 150 MG 24 hr tablet, TAKE 1 TABLET BY MOUTH DAILY, Disp: 30 tablet, Rfl: 11 .  citalopram (CELEXA) 40 MG tablet, Take 1 tablet (40 mg total) by mouth daily., Disp: 30 tablet, Rfl: 5 .  clopidogrel (PLAVIX) 75 MG tablet, Take 1 tablet (75 mg total) by mouth daily with breakfast., Disp: 30 tablet, Rfl: 2 .  gabapentin (NEURONTIN) 300 MG capsule, TAKE 1 CAPSULE BY MOUTH TWICE A DAY (Patient taking differently: Take 300 mg by mouth 2 (two) times daily. ), Disp: 60 capsule, Rfl: 5 .  isosorbide mononitrate (IMDUR) 120 MG 24 hr tablet, Take 1 tablet (120 mg total) by mouth daily., Disp: 90 tablet, Rfl: 1 .  levothyroxine (SYNTHROID, LEVOTHROID) 25 MCG tablet, Take 1 tablet (  25 mcg total) daily before breakfast by mouth., Disp: 30 tablet, Rfl: 12 .  metoprolol succinate (TOPROL-XL) 50 MG 24 hr tablet, Take 1 tablet (50 mg total) by mouth daily. Take with or immediately following a meal., Disp: 30 tablet, Rfl: 3 .  nitroGLYCERIN (NITROSTAT) 0.4 MG SL tablet, Place 1 tablet (0.4 mg total) under the tongue every 5 (five) minutes as needed for chest pain., Disp: 25 tablet, Rfl: 2 .  pantoprazole (PROTONIX) 40 MG tablet, Take 1 tablet (40 mg total) by mouth 2 (two) times daily., Disp: 60 tablet, Rfl: 5 .   pyridostigmine (MESTINON) 60 MG tablet, Take 60 mg by mouth 3 (three) times daily. , Disp: , Rfl:  .  rosuvastatin (CRESTOR) 40 MG tablet, Take 1 tablet (40 mg total) by mouth daily., Disp: 90 tablet, Rfl: 3 .  tiZANidine (ZANAFLEX) 4 MG tablet, Take 4 mg by mouth daily as needed for pain., Disp: , Rfl:   Review of Systems  Constitutional: Positive for fatigue. Negative for activity change, appetite change, chills, diaphoresis, fever and unexpected weight change.  Respiratory: Positive for shortness of breath (Chronic issue). Negative for apnea, cough, choking, chest tightness, wheezing and stridor.   Cardiovascular: Negative.   Gastrointestinal: Negative.   Neurological: Negative for dizziness, light-headedness and headaches.    Social History   Tobacco Use  . Smoking status: Former Smoker    Types: Cigarettes    Last attempt to quit: 08/31/1994    Years since quitting: 23.5  . Smokeless tobacco: Never Used  . Tobacco comment: quit 28 years ago  Substance Use Topics  . Alcohol use: No    Alcohol/week: 0.0 standard drinks      Objective:    Vitals:   03/24/18 0817 03/24/18 0838  BP: (!) 157/80 140/84  Pulse: 80   Temp: 98 F (36.7 C)   TempSrc: Oral   Weight: 183 lb (83 kg)     Physical Exam   General Appearance:    Alert, cooperative, no distress  Eyes:    PERRL, conjunctiva/corneas clear, EOM's intact       Lungs:     Clear to auscultation bilaterally, respirations unlabored  Heart:    Regular rate and rhythm  Neurologic:   Awake, alert, oriented x 3. No apparent focal neurological           defect.          Assessment & Plan    1. Labile blood pressure Was relatively hypotensive on day of ER visit 03/02/2018. Suspect this is related to polypharmacy and taking medications inconsistently. Pyridostigmine is noted to have potential side effect of hypotension, although dose is low. She is now slightly hypertensive on 54m  metoprolol. Advised to take pyridostigmine Q8  hours consistently which will hopefully maintain a more consistent BP. Continue 533mmetoprolol succinate daily for the time being. She has upcoming cardiology appointment and previously scheduled appointment with me in March Future Appointments  Date Time Provider [place]3/10/2018  8:00 AM GoMinna MerrittsMD [facility]  04/18/2018  8:20 AM FiCaryn SectionDoKirstie PeriMD [facility] None     2. Atherosclerosis of native coronary artery of native heart with stable angina pectoris ([facility] with [facility] that she is taking 4065mosuvastatin consistently every day and she states she is tolerating it well. Lipids will need to be checked in March to make sure LDL is back down below 70.   3. Hyperlipidemia, unspecified hyperlipidemia type She is tolerating rosuvastatin  well with no adverse effects.    4. Autonomic neuropathy Greatly improved on pyridostigmine prescribed by Dr. Karl Ito, MD  [place]

## 2018-03-24 NOTE — Patient Instructions (Addendum)
.   Please review the attached list of medications and notify my office if there are any errors.   . Please bring all of your medications to every appointment so we can make sure that our medication list is the same as yours.    Pyridostigmine can make your blood pressure go up or down if you don't take it consistently. Try to remember to take it every eight hours to keep your blood pressure more consistent

## 2018-03-25 DIAGNOSIS — N184 Chronic kidney disease, stage 4 (severe): Secondary | ICD-10-CM | POA: Diagnosis not present

## 2018-03-25 DIAGNOSIS — E1122 Type 2 diabetes mellitus with diabetic chronic kidney disease: Secondary | ICD-10-CM | POA: Diagnosis not present

## 2018-03-25 DIAGNOSIS — I10 Essential (primary) hypertension: Secondary | ICD-10-CM | POA: Diagnosis not present

## 2018-03-25 DIAGNOSIS — N2581 Secondary hyperparathyroidism of renal origin: Secondary | ICD-10-CM | POA: Diagnosis not present

## 2018-03-28 ENCOUNTER — Other Ambulatory Visit: Payer: Self-pay | Admitting: Family Medicine

## 2018-03-28 DIAGNOSIS — E039 Hypothyroidism, unspecified: Secondary | ICD-10-CM

## 2018-03-28 MED ORDER — LEVOTHYROXINE SODIUM 25 MCG PO TABS: 25.0000 ug | ORAL_TABLET | Freq: Every day | ORAL | 12 refills | Status: DC

## 2018-03-28 NOTE — Telephone Encounter (Signed)
[facility] faxed refill request for the following medications:  levothyroxine (SYNTHROID, LEVOTHROID) 25 MCG tablet   Please advise.

## 2018-04-04 ENCOUNTER — Other Ambulatory Visit: Payer: Self-pay

## 2018-04-04 DIAGNOSIS — R220 Localized swelling, mass and lump, head: Secondary | ICD-10-CM | POA: Diagnosis not present

## 2018-04-04 DIAGNOSIS — Y999 Unspecified external cause status: Secondary | ICD-10-CM | POA: Insufficient documentation

## 2018-04-04 DIAGNOSIS — Z7982 Long term (current) use of aspirin: Secondary | ICD-10-CM | POA: Diagnosis not present

## 2018-04-04 DIAGNOSIS — Y939 Activity, unspecified: Secondary | ICD-10-CM | POA: Insufficient documentation

## 2018-04-04 DIAGNOSIS — N183 Chronic kidney disease, stage 3 (moderate): Secondary | ICD-10-CM | POA: Insufficient documentation

## 2018-04-04 DIAGNOSIS — Z8673 Personal history of transient ischemic attack (TIA), and cerebral infarction without residual deficits: Secondary | ICD-10-CM | POA: Diagnosis not present

## 2018-04-04 DIAGNOSIS — Z87891 Personal history of nicotine dependence: Secondary | ICD-10-CM | POA: Insufficient documentation

## 2018-04-04 DIAGNOSIS — W01198A Fall on same level from slipping, tripping and stumbling with subsequent striking against other object, initial encounter: Secondary | ICD-10-CM | POA: Diagnosis not present

## 2018-04-04 DIAGNOSIS — S0990XA Unspecified injury of head, initial encounter: Secondary | ICD-10-CM | POA: Diagnosis not present

## 2018-04-04 DIAGNOSIS — Y929 Unspecified place or not applicable: Secondary | ICD-10-CM | POA: Insufficient documentation

## 2018-04-04 DIAGNOSIS — I130 Hypertensive heart and chronic kidney disease with heart failure and stage 1 through stage 4 chronic kidney disease, or unspecified chronic kidney disease: Secondary | ICD-10-CM | POA: Insufficient documentation

## 2018-04-04 DIAGNOSIS — Z7901 Long term (current) use of anticoagulants: Secondary | ICD-10-CM | POA: Insufficient documentation

## 2018-04-04 DIAGNOSIS — E1122 Type 2 diabetes mellitus with diabetic chronic kidney disease: Secondary | ICD-10-CM | POA: Insufficient documentation

## 2018-04-04 DIAGNOSIS — I5022 Chronic systolic (congestive) heart failure: Secondary | ICD-10-CM | POA: Insufficient documentation

## 2018-04-04 DIAGNOSIS — E1140 Type 2 diabetes mellitus with diabetic neuropathy, unspecified: Secondary | ICD-10-CM | POA: Insufficient documentation

## 2018-04-04 DIAGNOSIS — E039 Hypothyroidism, unspecified: Secondary | ICD-10-CM | POA: Diagnosis not present

## 2018-04-04 NOTE — ED Triage Notes (Signed)
Pt to the er for a fall today in which she hit her head on the corner of the cement block. Pt denies loss of bowel or bladder.No significant LOC. Pt has minimal swelling to the forehead and no bruising.

## 2018-04-05 ENCOUNTER — Emergency Department: Payer: Medicare Other

## 2018-04-05 ENCOUNTER — Emergency Department
Admission: EM | Admit: 2018-04-05 | Discharge: 2018-04-05 | Disposition: A | Discharge status: Home / Self Care | Payer: Medicare Other | Attending: Emergency Medicine | Admitting: Emergency Medicine

## 2018-04-05 DIAGNOSIS — R220 Localized swelling, mass and lump, head: Secondary | ICD-10-CM | POA: Diagnosis not present

## 2018-04-05 DIAGNOSIS — S0990XA Unspecified injury of head, initial encounter: Secondary | ICD-10-CM

## 2018-04-05 MED ORDER — KETOROLAC TROMETHAMINE 10 MG PO TABS
10.0000 mg | ORAL_TABLET | Freq: Once | ORAL | Status: AC
  Administered 2018-04-05: 10 mg via ORAL
  Filled 2018-04-05: qty 1

## 2018-04-05 NOTE — ED Notes (Signed)
Report off to [employer]

## 2018-04-05 NOTE — ED Notes (Signed)
Pt states that she fell and hit head on concrete yesterday while at home. Pt states she had LOC and headache. Pt states that headache is still occurring but has subsided partially.

## 2018-04-07 ENCOUNTER — Inpatient Hospital Stay: Payer: Medicare Other | Admitting: Oncology

## 2018-04-08 IMAGING — MR MR SHOULDER*R* W/O CM
5 series · 40 of 40 positions shown · non-contrast
Comparison: Previous MRI 05/24/2015.

CLINICAL DATA: Shoulder pain with painful range of motion after
falling 3 weeks ago. Right shoulder arthroscopy 01/07/2016 for
incomplete rotator cuff tear and degenerative labral tear.

EXAM:
MRI OF THE RIGHT SHOULDER WITHOUT CONTRAST
TECHNIQUE: Multiplanar, multisequence MR imaging of the shoulder was performed.
No intravenous contrast was administered.

[Series 4: T2 fat-sat · oblique · 4.0mm · 0.62mm/px · 8 of 21 slices shown (1 of 3)]
[im 1/21]
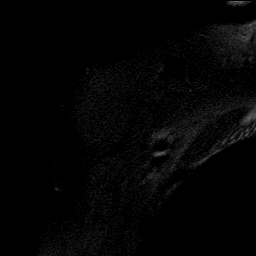
[im 3/21]
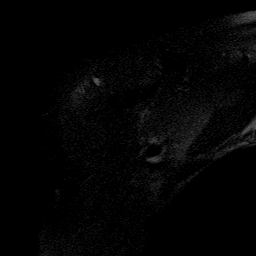
[im 6/21]
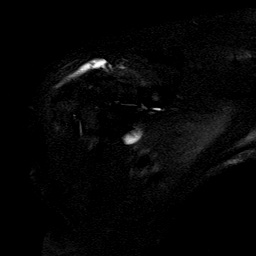
[im 9/21]
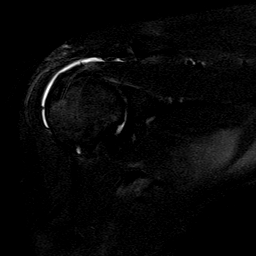
[im 12/21]
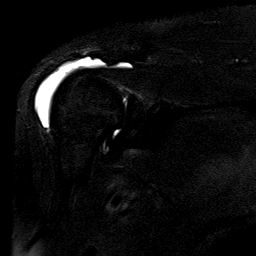
[im 15/21]
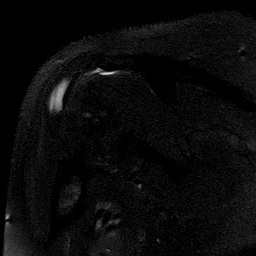
[im 18/21]
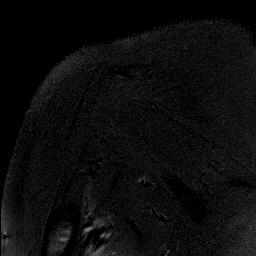
[im 21/21]
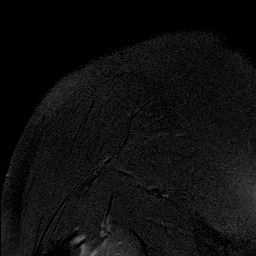

[Series 5: PD · oblique · 4.0mm · 0.62mm/px · 8 of 21 slices shown]
[im 1/21]
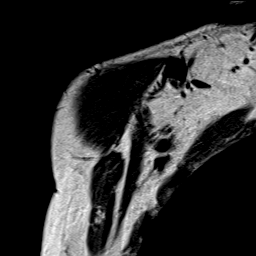
[im 3/21]
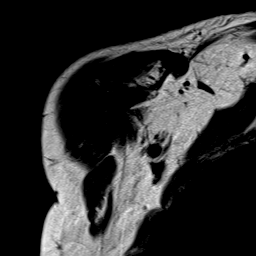
[im 6/21]
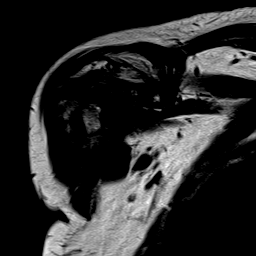
[im 9/21]
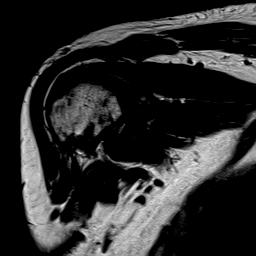
[im 12/21]
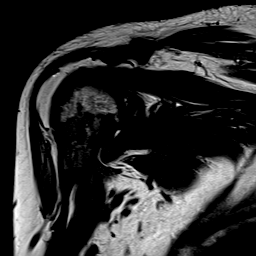
[im 15/21]
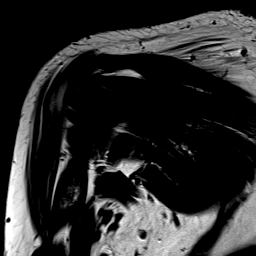
[im 18/21]
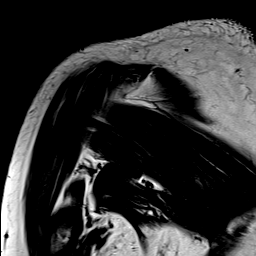
[im 21/21]
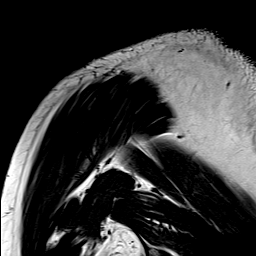

[Series 6: T1 · oblique · 4.0mm · 0.62mm/px · 8 of 23 slices shown]
[im 1/23]
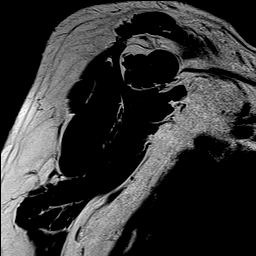
[im 4/23]
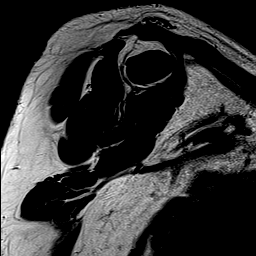
[im 7/23]
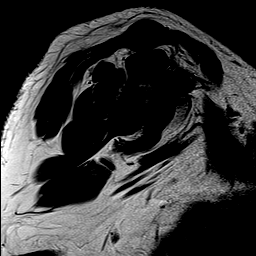
[im 10/23]
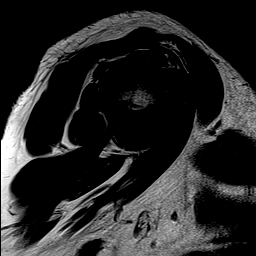
[im 13/23]
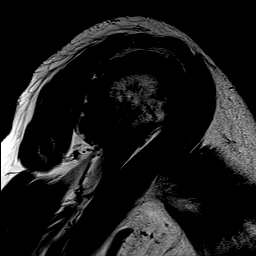
[im 16/23]
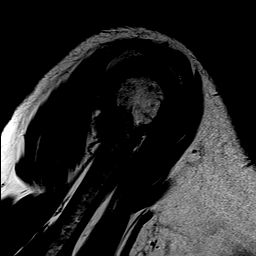
[im 19/23]
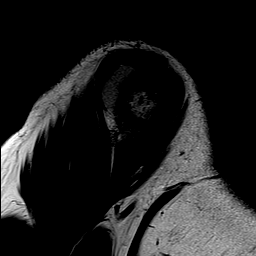
[im 23/23]
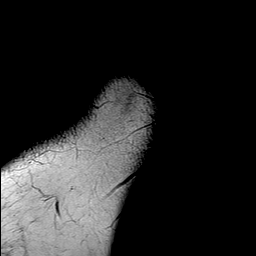

[Series 7: T2 fat-sat · oblique · 4.0mm · 0.62mm/px · 8 of 23 slices shown (2 of 3)]
[im 1/23]
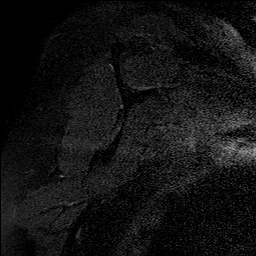
[im 4/23]
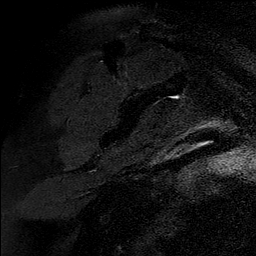
[im 7/23]
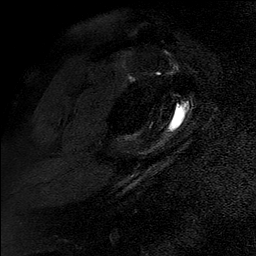
[im 10/23]
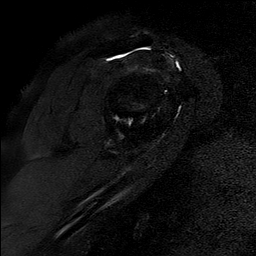
[im 13/23]
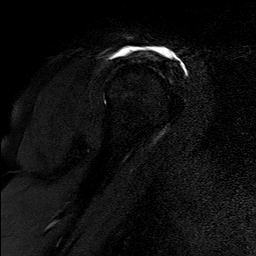
[im 16/23]
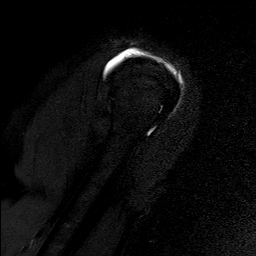
[im 19/23]
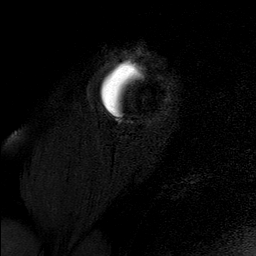
[im 23/23]
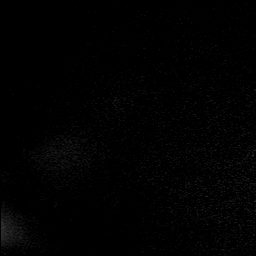

[Series 8: T2 fat-sat · axial · 4.0mm · 0.47mm/px · z∈[+28,+124]mm · 8 of 23 slices shown (3 of 3)]
[im 1/23]
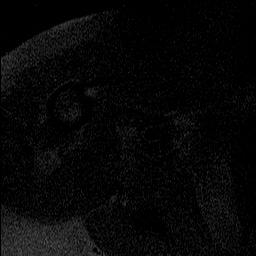
[im 4/23]
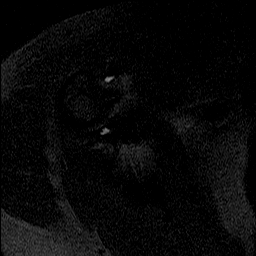
[im 7/23]
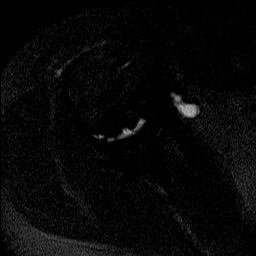
[im 10/23]
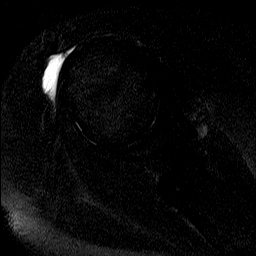
[im 13/23]
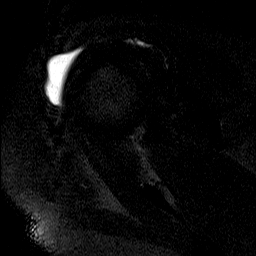
[im 16/23]
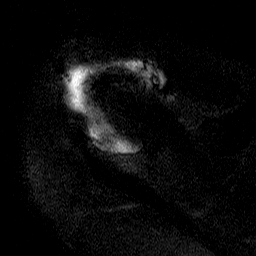
[im 19/23]
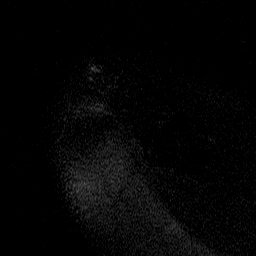
[im 23/23]
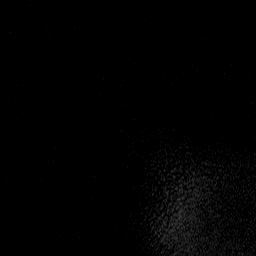

[40 of 40 positions shown; findings below may reference images not displayed]

FINDINGS: Rotator cuff: There is mild postsurgical susceptibility artifact in
the deltoid muscle, suggesting interval rotator cuff surgery. There
is mild supraspinatus tendinosis, but no evidence of recurrent focal
rotator cuff tear. The subscapularis, infraspinatus and teres minor
tendons appear normal.

Muscles:  No focal muscular atrophy or edema.

Biceps long head: The intra-articular portion of the tendon is not
well visualized. The tendon is intact in the bicipital groove.

Acromioclavicular Joint: The acromion is type 1. There are mild
acromioclavicular degenerative changes. There is a large amount of
fluid in the subacromial -subdeltoid bursa. There is also some fluid
anteriorly in the subcoracoid bursa.

Glenohumeral Joint: No significant shoulder joint effusion or
glenohumeral arthropathy.There is persistent inferior joint capsular
thickening as can be seen with adhesive capsulitis.

Labrum: Labral evaluation remains limited by the lack joint fluid.
No evidence of labral tear.

Bones: No acute osseous findings. Generally decreased marrow signal
attributed to chronic renal disease.

Other: No significant soft tissue findings.
IMPRESSION: 1. Interval rotator cuff debridement and subacromial decompression.
No evidence of rotator cuff tear.
2. Marked subacromial -subdeltoid bursitis, likely contributing to
the patient's symptoms.
3. Limited visualization of the intra-articular portion of the
biceps tendon.
4. Persistent joint capsular thickening inferiorly as can be seen
with adhesive capsulitis.

## 2018-04-09 NOTE — ED Provider Notes (Signed)
[facility] Emergency Department Provider Note   None    (approximate)  I have reviewed the triage vital signs and the nursing notes.   HISTORY  Chief Complaint Fall    HPI [name] is a 48 y.o. female with below list of chronic medical conditions presents to the emergency department with  to dental fall today with resultant head injury.  Patient states that she is struck her head against a cement block.  Patient denies any loss of consciousness.  Patient denies any nausea or vomiting.  Patient denies any weakness numbness gait instability or visual changes.        Past Medical History:  Diagnosis Date  . Acute pyelonephritis   . Anemia    iron deficiency anemia  . Aortic arch aneurysm ([place])   . Bipolar disorder ([place])   . BRCA negative 2014  . CAD (coronary artery disease)    a. 08/2003 Cath: LAD 30-40-med Rx; b. 11/2014 PCI: LAD 70m(3.25x23 Xience Alpine DES); c. 06/2015 PCI: D1 (2.25x12 Resolute Integrity DES); d. 06/2017 PCI: Patent mLAD stent, D2 95 (PTCA); e. 09/2017 PCI: D2 99ost (CBA); d. 12/2017 Cath: LM nl, LAD 325m80d (small), D1 40ost, D2 95ost, LCX 40p, RCA 40ost/p->Med rx for D2 given restenosis.  . CKD (chronic kidney disease), stage III (HCTazewell  . Colon polyp   . CVA (cerebral vascular accident) (HCR[place]   Left side weakness.   . Diabetes (HCLyons  . Family history of breast cancer    BRCA neg 2014  . Gastric ulcer 04/27/2011  . History of echocardiogram    a. 03/2017 Echo: EF 60-65%, no rwma; b. 02/2018 Echo: EF 60-65%, no rwma. Nl RV fxn. No cardiac source of emboli (admitted w/ stroke).  . [place] (hypertension)   . Hyperlipemia   . Hypothyroid   . Malignant melanoma of skin of scalp (HCChenango Bridge  . MI, acute, non ST segment elevation (HCGanado  . Neuromuscular disorder (HCBerlin Heights  . Orthostatic hypotension   . S/P drug eluting coronary stent placement 06/04/2015  . Sepsis (HCWimberley2/14/2019  . [facility]   a. 02/2018 MRI: 24m43mate  acute/early subacute L medial frontal lobe inarct; b. 02/2018 MRA No large vessel occlusion or aneurysm. Mod to sev L P2 stenosis. thready L vertebral artery, diffusely dzs'd; c. 02/2018 Carotid U/S: <50% bilat ICA dzs.    Patient Active Problem List   Diagnosis Date Noted  . Autonomic neuropathy 03/24/2018  . Acute delirium 03/03/2018  . H/O gastric bypass 03/02/2018  . MI, acute, non ST segment elevation (HCCGrace City . Effort angina (HCCGreenhills5/28/2019  . Unstable angina (HCCBent5/23/2019  . Syncope 04/09/2017  . Insomnia 03/18/2017  . Ischemic cardiomyopathy   . Arthritis   . Anxiety   . Acute colitis 01/27/2017  . Hx of colonic polyps   . H/O medication noncompliance 12/14/2015  . Emesis   . Atherosclerosis of native coronary artery of native heart with stable angina pectoris (HCCCannon . Hypertensive heart disease   . CKD (chronic kidney disease), stage III (HCCPersia5/04/2015  . Presence of drug coated stent in LAD coronary artery & now in D1 06/04/2015  . Colitis 06/03/2015  . Carotid stenosis 04/30/2015  . Type 2 diabetes mellitus with stage 3 chronic kidney disease, without long-term current use of insulin (HCCSunburg . Stable angina pectoris (HCCMor[place]3/15/2017  . Iron deficiency anemia 03/22/2015  . Vitamin  B12 deficiency 02/18/2015  . Misuse of medications for pain 02/18/2015  . Major depressive disorder, recurrent, severe with psychotic features ([place]) 02/15/2015  . Helicobacter pylori infection 11/23/2014  . Hemiparesis, left ([place]) 11/23/2014  . Benign neoplasm of colon 11/20/2014  . Malignant melanoma ([place]) 08/25/2014  . Chronic systolic CHF (congestive heart failure) ([place])   . Incomplete bladder emptying 07/12/2014  . Adult hypothyroidism 12/30/2013  . Aberrant subclavian artery 11/17/2013  . Multiple sclerosis ([place]) 11/02/2013  . History of CVA (cerebrovascular accident) 06/20/2013  . Headache, migraine 05/29/2013  . Hyperlipidemia   . GERD (gastroesophageal reflux disease)   .  Neuropathy ([place]) 01/02/2011  . Stroke ([place]) 06/21/2008  . Essential hypertension 05/01/2008    Past Surgical History:  Procedure Laterality Date  . APPENDECTOMY    . CARDIAC CATHETERIZATION N/A 11/09/2014   Procedure: Coronary Angiography;  Surgeon: Minna Merritts, MD;  Location: [place] [facility];  Service: Cardiovascular;  Laterality: N/A;  . CARDIAC CATHETERIZATION N/A 11/12/2014   Procedure: Coronary Stent Intervention;  Surgeon: Isaias Cowman, MD;  Location: [place] [facility];  Service: Cardiovascular;  Laterality: N/A;  . CARDIAC CATHETERIZATION N/A 04/18/2015   Procedure: Left Heart Cath and Coronary Angiography;  Surgeon: Minna Merritts, MD;  Location: [place] [facility];  Service: Cardiovascular;  Laterality: N/A;  . CARDIAC CATHETERIZATION Left 06/04/2015   Procedure: Left Heart Cath and Coronary Angiography;  Surgeon: Wellington Hampshire, MD;  Location: [place] [facility];  Service: Cardiovascular;  Laterality: Left;  . CARDIAC CATHETERIZATION N/A 06/04/2015   Procedure: Coronary Stent Intervention;  Surgeon: Wellington Hampshire, MD;  Location: [place] [facility];  Service: Cardiovascular;  Laterality: N/A;  . CESAREAN SECTION  2001  . CHOLECYSTECTOMY N/A 11/18/2016   Procedure: LAPAROSCOPIC CHOLECYSTECTOMY WITH INTRAOPERATIVE CHOLANGIOGRAM;  Surgeon: Christene Lye, MD;  Location: [facility];  Service: General;  Laterality: N/A;  . COLONOSCOPY WITH PROPOFOL N/A 04/27/2016   Procedure: COLONOSCOPY WITH PROPOFOL;  Surgeon: [name], MD;  Location: [place];  Service: Endoscopy;  Laterality: N/A;  . COLONOSCOPY WITH PROPOFOL N/A 01/12/2018   Procedure: COLONOSCOPY WITH PROPOFOL;  Surgeon: Toledo, Benay Pike, MD;  Location: [facility] ENDOS[facility];  Service: Endoscopy;  Laterality: N/A;  . CORONARY ANGIOPLASTY    . CORONARY BALLOON ANGIOPLASTY N/A 06/29/2017   Procedure: CORONARY BALLOON ANGIOPLASTY;  Surgeon: Wellington Hampshire, MD;  Location: [place] [facility]  [facility];  Service: Cardiovascular;  Laterality: N/A;  . CORONARY BALLOON ANGIOPLASTY N/A 09/20/2017   Procedure: CORONARY BALLOON ANGIOPLASTY;  Surgeon: Wellington Hampshire, MD;  Location: [place] [facility];  Service: Cardiovascular;  Laterality: N/A;  . DILATION AND CURETTAGE OF UTERUS    . ESOPHAGOGASTRODUODENOSCOPY (EGD) WITH PROPOFOL N/A 09/14/2014   Procedure: ESOPHAGOGASTRODUODENOSCOPY (EGD) WITH PROPOFOL;  Surgeon: Josefine Class, MD;  [facility];  Service: Endoscopy;  Laterality: N/A;  . ESOPHAGOGASTRODUODENOSCOPY (EGD) WITH PROPOFOL N/A 04/27/2016   Procedure: ESOPHAGOGASTRODUODENOSCOPY (EGD) WITH PROPOFOL;  Surgeon: [name], MD;  Location: [place];  Service: Endoscopy;  Laterality: N/A;  Diabetic - oral meds  . ESOPHAGOGASTRODUODENOSCOPY (EGD) WITH PROPOFOL N/A 01/12/2018   Procedure: ESOPHAGOGASTRODUODENOSCOPY (EGD) WITH PROPOFOL;  Surgeon: Toledo, Benay Pike, MD;  Location: [facility] ENDOSCOPY;  Service: Endoscopy;  Laterality: N/A;  . GASTRIC BYPASS  09/2009   [facility]   . Left Carotid to sublcavian artery bypass w/ subclavian artery ligation     a. Performed @ [place].  . LEFT HEART CATH AND CORONARY ANGIOGRAPHY  Left 06/29/2017   Procedure: LEFT HEART CATH AND CORONARY ANGIOGRAPHY;  Surgeon: Wellington Hampshire, MD;  Location: [place] [facility];  Service: Cardiovascular;  Laterality: Left;  . LEFT HEART CATH AND CORONARY ANGIOGRAPHY N/A 09/20/2017   Procedure: LEFT HEART CATH AND CORONARY ANGIOGRAPHY;  Surgeon: Wellington Hampshire, MD;  Location: [place] [facility];  Service: Cardiovascular;  Laterality: N/A;  . LEFT HEART CATH AND CORONARY ANGIOGRAPHY N/A 12/20/2017   Procedure: LEFT HEART CATH AND CORONARY ANGIOGRAPHY;  Surgeon: Wellington Hampshire, MD;  Location: [place] [facility];  Service: Cardiovascular;  Laterality: N/A;  . MELANOMA EXCISION  2016   Dr. Evorn Gong  . [place]  2002  . RIGHT OOPHORECTOMY    . SHOULDER  ARTHROSCOPY WITH OPEN ROTATOR CUFF REPAIR Right 01/07/2016   Procedure: SHOULDER ARTHROSCOPY WITH DEBRIDMENT, SUBACHROMIAL DECOMPRESSION;  Surgeon: Corky Mull, MD;  Location: [facility];  Service: Orthopedics;  Laterality: Right;  . SHOULDER ARTHROSCOPY WITH OPEN ROTATOR CUFF REPAIR Right 03/16/2017   Procedure: SHOULDER ARTHROSCOPY WITH OPEN ROTATOR CUFF REPAIR POSSIBLE BICEPS TENODESIS;  Surgeon: Corky Mull, MD;  Location: [facility];  Service: Orthopedics;  Laterality: Right;  . TRIGGER FINGER RELEASE Right     [place]    Prior to Admission medications   Medication Sig Start Date End Date Taking? Authorizing Provider  ALPRAZolam Duanne Moron) 1 MG tablet Take 1 tablet (1 mg total) by mouth at bedtime. 03/04/18   Clapacs, Madie Reno, MD  aspirin 81 MG chewable tablet Chew 1 tablet (81 mg total) by mouth daily. 07/01/17   Dunn, Areta Haber, PA-C  Blood Glucose Monitoring Suppl (ONE TOUCH ULTRA 2) w/Device KIT Use to check blood sugar once a day. Dx. E11.9 03/18/17   [name], MD  buPROPion (WELLBUTRIN XL) 150 MG 24 hr tablet TAKE 1 TABLET BY MOUTH DAILY 08/10/17   [name], MD  citalopram (CELEXA) 40 MG tablet Take 1 tablet (40 mg total) by mouth daily. 06/08/17   [name], MD  clopidogrel (PLAVIX) 75 MG tablet Take 1 tablet (75 mg total) by mouth daily with breakfast. 07/01/17   Dunn, Areta Haber, PA-C  gabapentin (NEURONTIN) 300 MG capsule TAKE 1 CAPSULE BY MOUTH TWICE A DAY Patient taking differently: Take 300 mg by mouth 2 (two) times daily.  10/30/17   [name], MD  isosorbide mononitrate (IMDUR) 120 MG 24 hr tablet Take 1 tablet (120 mg total) by mouth daily. 10/13/17   Rise Mu, PA-C  levothyroxine (SYNTHROID, LEVOTHROID) 25 MCG tablet Take 1 tablet (25 mcg total) by mouth daily before breakfast. 03/28/18   [name], MD  metoprolol succinate (TOPROL-XL) 100 MG 24 hr tablet Take 1/2 tablet daily 03/24/18   [name], MD  nitroGLYCERIN (NITROSTAT) 0.4 MG SL tablet  Place 1 tablet (0.4 mg total) under the tongue every 5 (five) minutes as needed for chest pain. 06/02/17   Minna Merritts, MD  pantoprazole (PROTONIX) 40 MG tablet Take 1 tablet (40 mg total) by mouth 2 (two) times daily. 03/02/18   [name], MD  pyridostigmine (MESTINON) 60 MG tablet Take 60 mg by mouth 3 (three) times daily.  07/26/17   [provider]  rosuvastatin (CRESTOR) 40 MG tablet Take 1 tablet (40 mg total) by mouth daily. 03/01/18 05/30/18  Theora Gianotti, NP  tiZANidine (ZANAFLEX) 4 MG tablet Take 4 mg by mouth daily as needed for pain. 03/07/18   [provider]    Allergies Lipitor [atorvastatin] and Tramadol  Family History  Problem Relation Age of Onset  . Hypertension Mother   . Anxiety disorder Mother   . Depression Mother   . Bipolar disorder Mother   . Heart disease Mother        No details  . Hyperlipidemia Mother   . Kidney disease Father   . Heart disease Father 29  . Hypertension Father   . Diabetes Father   . Stroke Father   . Colon cancer Father        dx in his 93's  . Anxiety disorder Father   . Depression Father   . Skin cancer Father   . Kidney disease Sister   . Thyroid nodules Sister   . Hypertension Sister   . Hypertension Sister   . Diabetes Sister   . Hyperlipidemia Sister   . Depression Sister   . Breast cancer Maternal Aunt 59  . Breast cancer Maternal Aunt 41  . Ovarian cancer Cousin   . Colon cancer Cousin   . Breast cancer Other   . Kidney cancer Neg Hx   . Bladder Cancer Neg Hx     Social History Social History   Tobacco Use  . Smoking status: Former Smoker    Types: Cigarettes    Last attempt to quit: 08/31/1994    Years since quitting: 23.6  . Smokeless tobacco: Never Used  . Tobacco comment: quit 28 years ago  Substance Use Topics  . Alcohol use: No    Alcohol/week: 0.0 standard drinks  . Drug use: No    Review of Systems Constitutional: No fever/chills Eyes: No visual  changes. ENT: No sore throat. Cardiovascular: Denies chest pain. Respiratory: Denies shortness of breath. Gastrointestinal: No abdominal pain.  No nausea, no vomiting.  No diarrhea.  No constipation. Genitourinary: Negative for dysuria. Musculoskeletal: Negative for neck pain.  Negative for back pain. Integumentary: Negative for rash. Neurological: Negative for headaches, focal weakness or numbness.   ____________________________________________   PHYSICAL EXAM:  VITAL SIGNS: ED Triage Vitals  Enc Vitals Group     BP 04/04/18 2226 (!) 159/81     Pulse Rate 04/04/18 2226 67     Resp 04/04/18 2226 16     Temp 04/04/18 2226 98.3 F (36.8 C)     Temp Source 04/04/18 2226 Oral     SpO2 04/04/18 2226 100 %     Weight 04/04/18 2227 76.7 kg (169 lb)     Height 04/04/18 2227 1.6 m (_0 )     Head Circumference --      Peak Flow --      Pain Score 04/04/18 2227 9     Pain Loc --      Pain Edu? --      Excl. in [place]? --     Constitutional: Alert and oriented. Well appearing and in no acute distress. Eyes: Conjunctivae are normal. PERRL. EOMI. Head: Mild swelling noted to the forehead Mouth/Throat: Mucous membranes are moist. Oropharynx non-erythematous. Neck: No stridor. No cervical spine tenderness to palpation. Cardiovascular: Normal rate, regular rhythm. Good peripheral circulation. Grossly normal heart sounds. Respiratory: Normal respiratory effort.  No retractions. Lungs CTAB. Gastrointestinal: Soft and nontender. No distention.  Musculoskeletal: No lower extremity tenderness nor edema. No gross deformities of extremities. Neurologic:  Normal speech and language. No gross focal neurologic deficits are appreciated.  Skin:  Skin is warm, dry and intact. No rash noted. Psychiatric: Mood and affect are normal.  Speech and behavior are normal.    Procedures   ____________________________________________   INITIAL IMPRESSION / MDM / [place] / ED COURSE  As  part of my medical decision making, I reviewed the following data within the electronic MEDICAL RECORD NUMBER  48 year old female presented with above-stated history and physical exams following accidental fall with resultant head injury.  CT head revealed no acute intracranial abnormality.  ____________________________________________  FINAL CLINICAL IMPRESSION(S) / ED DIAGNOSES  Final diagnoses:  Injury of head, initial encounter     MEDICATIONS GIVEN DURING THIS VISIT:  Medications  ketorolac (TORADOL) tablet 10 mg (10 mg Oral Given 04/05/18 0330)     ED Discharge Orders    None       Note:  This document was prepared using Dragon voice recognition software and may include unintentional dictation errors.   Gregor Hams, MD 04/09/18 [phone]

## 2018-04-10 NOTE — Progress Notes (Signed)
Cardiology Office Note  Date:  04/11/2018   ID:  [place], [place] November 14, 1970, MRN [id number]  PCP:  [name], MD   Chief Complaint  Patient presents with  . other     3 mo follow up.Medications reviewed verbally with patient.    HPI:  [name] is a 48 y.o. woman with a long hx of  smoking,  strokes,  2010 chest pain dating back to 2005  CAD, Previous LAD stent  normal LV function,   s/p gastric bypass surgery,  Baseline creatinine 1.5, previously taken off her diuretic Catheterization May 2017 with stent placed to her first diagonal, there was residual 50% distal LAD disease, 30% proximal, 40% proximal circumflex Who presents for routine followup of her coronary artery disease  INTERVAL HISTORY: The patient reports today for follow up. She has felt better than she does today. Last Monday she was coming back from grabbing the mail and tripped. She hit herself on concrete and went to the ED where she was diagnosed with a concussion. For a week prior to the ED visit on 02/17/18 she was feeling dizzy every time she got up. She was diagnosed with a stroke. They started her on amlodipine 2.5 mg. Her nephrologist says that her kidneys are in a bad state regardless of how well she is urinating. She is also going to the cancer center in the near future due to recent low iron.   She presented to the ED on 03/16/18 for hypotension with our recommendation after she called [place] regarding low blood pressures (72/52), dizziness, nausea, and syncope. She is unaware of the cause for the low blood pressure. She felt a better as soon as they gave her fluids in the ED.   She does not feel that she too over medicated.   She is compliant with the Crestor. Sometimes she forgets to take it at night since she is not used to taking medication at that time. She is on Mestinon, but is unaware of the reason she takes it. She only takes it once daily rather than three times daily. She usually does  not eat breakfast when she takes her medication.   Last night she did not sleep well due to leg pain. She is taking more gabapentin recently for relief.   Blood pressure 130/72 Total Chol 161/ LDL 109 HBA1C 5.8 CR 2.14 Glucose 81   EKG personally reviewed by myself on todays visit Shows normal sinus rhythm. 76 bpm. T wave abnormality, consider inferior ischemia.    OTHER PAST MEDICAL HISTORY REVIEWED BY ME FOR TODAY'S VISIT: Previous cardiac catheterization results discussed with her in detail Cardiac catheterization September 20, 2017 Significant one-vessel coronary artery disease with patent stents in the mid LAD and first diagonal.   significant chronic disease in the distal LAD close to the apex.  Severe restenosis  ostial second diagonal at the site of previous angioplasty in May 2019. Successful cutting balloon angioplasty of the ostial second diagonal without stent placement.   Cardiac catheterization 06/29/2017 70% mid LAD disease and first diagonal, as well as significant distal LAD disease close to apex Severe ostial stenosis second diagonal which was new  Cardiac catheterization   results reviewed with her from  March 2017showing moderate LAD disease, 70% diagonal disease small vessel, near the ostium,  Decision was made to treat this medically  Recent carotid ultrasound reviewed with her showing minimal bilateral carotid disease Recent echocardiogram reviewed with her showing essentially normal ejection  fraction  She is status post left carotid to subclavian artery bypass with subclavian artery ligation, done at [facility] by vascular surgery admission to [facility] from 10/5 to 11/13/14 with unstable angina and was found to have severe mid LAD lesion at 85-90% stenosis on diagnositc cardiac cath s/p PCI/DES on staged procedure.   CT in October 2015 showing Right aortic arch with a large retro- esophageal vascular segment, persistent dorsal aortic segment) giving off  aberrant left subclavian artery. This large caliber retroaortic segment measuring 18 mm compared to the ascending aorta which measures 22 mm. This large retroesophageal aortic segment along with the partially calcified atretic ductus ligament between the left aberrant subclavian artery and the left pulmonary artery creates an ALMOSTcomplete vascular ring. This is likelythe cause ofpatient's dysphagia [place].  Previously was taking midodrine or Florinef for hypotension following her stroke.  She previously presented to the hospital December 18, 2010 with chest pain.   stress test in the hospital showed no ischemia. Cardiac enzymes were negative. No EKG changes noted.   admission to the hospital 11/01/2012 with discharge 11/02/2012 with chest pain. She ruled out with negative cardiac enzymes. Blood pressures were in the 627-035 systolic range. Discharge blood pressure medications included amlodipine 10 mg daily, benazepril 40 mg daily, clonidine 0.2 mg., Coreg 3.125 mg twice a day  stroke and admission to the hospital at the end of December 2014. She had acute onset left sided weakness including arm and leg. She reports that review of MRI showed acute stroke, as well as previous strokes. She was seen by neurology and aspirin changed to Plavix 75 mg daily. Hemoglobin A1c 8.5. Notes indicate short run of SVT while she was in the hospital and on the monitor.  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[place]), Bipolar disorder ([place]), BRCA negative (2014), CAD (coronary artery disease), CKD (chronic kidney disease), stage III ([place]), Colon polyp, CVA (cerebral vascular accident) ([place]), Diabetes ([place]), Family history of breast cancer, Gastric ulcer (04/27/2011), History of echocardiogram, HTN (hypertension), Hyperlipemia, Hypothyroid, Malignant melanoma of skin of scalp ([place]), MI, acute, non ST segment elevation ([place]), Neuromuscular disorder ([place]), Orthostatic hypotension, S/P drug eluting coronary stent placement (06/04/2015), Sepsis ([place]) (03/18/2017), and Stroke ([place]).  PSH:    Past Surgical History:  Procedure Laterality Date  . APPENDECTOMY    . CARDIAC CATHETERIZATION N/A 11/09/2014   Procedure: Coronary Angiography;  Surgeon: Minna Merritts, MD;  Location: [place] [facility];  Service: Cardiovascular;  Laterality: N/A;  . CARDIAC CATHETERIZATION N/A 11/12/2014   Procedure: Coronary Stent Intervention;  Surgeon: Isaias Cowman, MD;  Location: [place] [facility];  Service: Cardiovascular;  Laterality: N/A;  . CARDIAC CATHETERIZATION N/A 04/18/2015   Procedure: Left Heart Cath and Coronary Angiography;  Surgeon: Minna Merritts, MD;  Location: [place] [facility];  Service: Cardiovascular;  Laterality: N/A;  . CARDIAC CATHETERIZATION Left 06/04/2015   Procedure: Left Heart Cath and Coronary Angiography;  Surgeon: Wellington Hampshire, MD;  Location: [place] [facility];  Service: Cardiovascular;  Laterality: Left;  . CARDIAC CATHETERIZATION  N/A 06/04/2015   Procedure: Coronary Stent Intervention;  Surgeon: Wellington Hampshire, MD;  Location: [place] [facility];  Service: Cardiovascular;  Laterality: N/A;  . CESAREAN SECTION  2001  . CHOLECYSTECTOMY N/A 11/18/2016   Procedure: LAPAROSCOPIC CHOLECYSTECTOMY WITH INTRAOPERATIVE CHOLANGIOGRAM;  Surgeon: Christene Lye, MD;  Location: [facility];  Service: General;  Laterality: N/A;  . COLONOSCOPY WITH PROPOFOL N/A  04/27/2016   Procedure: COLONOSCOPY WITH PROPOFOL;  Surgeon: [name], MD;  Location: [place];  Service: Endoscopy;  Laterality: N/A;  . COLONOSCOPY WITH PROPOFOL N/A 01/12/2018   Procedure: COLONOSCOPY WITH PROPOFOL;  Surgeon: Tol[place], Benay Pike, MD;  Location: [facility];  Service: Endoscopy;  Laterality: N/A;  . CORONARY ANGIOPLASTY    . CORONARY BALLOON ANGIOPLASTY N/A 06/29/2017   Procedure: CORONARY BALLOON ANGIOPLASTY;  Surgeon: Wellington Hampshire, MD;  Location: [place] [facility];  Service: Cardiovascular;  Laterality: N/A;  . CORONARY BALLOON ANGIOPLASTY N/A 09/20/2017   Procedure: CORONARY BALLOON ANGIOPLASTY;  Surgeon: Wellington Hampshire, MD;  Location: [place] [facility];  Service: Cardiovascular;  Laterality: N/A;  . DILATION AND CURETTAGE OF UTERUS    . ESOPHAGOGASTRODUODENOSCOPY (EGD) WITH PROPOFOL N/A 09/14/2014   Procedure: ESOPHAGOGASTRODUODENOSCOPY (EGD) WITH PROPOFOL;  Surgeon: Josefine Class, MD;  [facility];  Service: Endoscopy;  Laterality: N/A;  . ESOPHAGOGASTRODUODENOSCOPY (EGD) WITH PROPOFOL N/A 04/27/2016   Procedure: ESOPHAGOGASTRODUODENOSCOPY (EGD) WITH PROPOFOL;  Surgeon: [name], MD;  Location: [place];  Service: Endoscopy;  Laterality: N/A;  Diabetic - oral meds  . ESOPHAGOGASTRODUODENOSCOPY (EGD) WITH PROPOFOL N/A 01/12/2018   Procedure: ESOPHAGOGASTRODUODENOSCOPY (EGD) WITH PROPOFOL;  Surgeon: [place], Benay Pike, MD;  Location: [facility] ENDOS[facility];  Service: Endoscopy;  Laterality: N/A;  . GASTRIC BYPASS  09/2009   [facility]   . Left Carotid to sublcavian artery bypass w/ subclavian artery ligation     a. Performed @ [facility].  . LEFT HEART CATH AND CORONARY ANGIOGRAPHY Left 06/29/2017   Procedure: LEFT HEART CATH AND CORONARY ANGIOGRAPHY;  Surgeon: Wellington Hampshire, MD;  Location: [place] [facility];  Service: Cardiovascular;  Laterality: Left;  . LEFT HEART CATH AND CORONARY ANGIOGRAPHY N/A  09/20/2017   Procedure: LEFT HEART CATH AND CORONARY ANGIOGRAPHY;  Surgeon: Wellington Hampshire, MD;  Location: [place] [facility];  Service: Cardiovascular;  Laterality: N/A;  . LEFT HEART CATH AND CORONARY ANGIOGRAPHY N/A 12/20/2017   Procedure: LEFT HEART CATH AND CORONARY ANGIOGRAPHY;  Surgeon: Wellington Hampshire, MD;  Location: [place] [facility];  Service: Cardiovascular;  Laterality: N/A;  . MELANOMA EXCISION  2016   Dr. Evorn Gong  . [place]  2002  . RIGHT OOPHORECTOMY    . SHOULDER ARTHROSCOPY WITH OPEN ROTATOR CUFF REPAIR Right 01/07/2016   Procedure: SHOULDER ARTHROSCOPY WITH DEBRIDMENT, SUBACHROMIAL DECOMPRESSION;  Surgeon: Corky Mull, MD;  Location: [facility];  Service: Orthopedics;  Laterality: Right;  . SHOULDER ARTHROSCOPY WITH OPEN ROTATOR CUFF REPAIR Right 03/16/2017   Procedure: SHOULDER ARTHROSCOPY WITH OPEN ROTATOR CUFF REPAIR POSSIBLE BICEPS TENODESIS;  Surgeon: Corky Mull, MD;  Location: [facility];  Service: Orthopedics;  Laterality: Right;  . TRIGGER FINGER RELEASE Right     [place]    Current Outpatient Medications  Medication Sig Dispense Refill  . ALPRAZolam (XANAX) 1 MG tablet Take 1 tablet (1 mg total) by mouth at bedtime. (Patient taking differently: Take 1 mg by mouth at bedtime. Pt taking 2.5 mg daily.) 14 tablet 0  . aspirin 81  MG chewable tablet Chew 1 tablet (81 mg total) by mouth daily. 30 tablet 11  . Blood Glucose Monitoring Suppl (ONE TOUCH ULTRA 2) w/Device KIT Use to check blood sugar once a day. Dx. E11.9 1 each 0  . buPROPion (WELLBUTRIN XL) 150 MG 24 hr tablet TAKE 1 TABLET BY MOUTH DAILY 30 tablet 11  . citalopram (CELEXA) 40 MG tablet Take 1 tablet (40 mg total) by mouth daily. 30 tablet 5  . clopidogrel (PLAVIX) 75 MG tablet Take 1 tablet (75 mg total) by mouth daily with breakfast. 30 tablet 2  . gabapentin (NEURONTIN) 300 MG capsule TAKE 1 CAPSULE BY MOUTH TWICE A DAY (Patient taking differently: Take 300 mg by mouth 2 (two) times  daily. ) 60 capsule 5  . isosorbide mononitrate (IMDUR) 120 MG 24 hr tablet Take 1 tablet (120 mg total) by mouth daily. 90 tablet 1  . levothyroxine (SYNTHROID, LEVOTHROID) 25 MCG tablet Take 1 tablet (25 mcg total) by mouth daily before breakfast. 30 tablet 12  . metFORMIN (GLUCOPHAGE) 500 MG tablet Take 500 mg by mouth 1 day or 1 dose.    . metoprolol succinate (TOPROL-XL) 100 MG 24 hr tablet Take 1/2 tablet daily 3 tablet 0  . nitroGLYCERIN (NITROSTAT) 0.4 MG SL tablet Place 1 tablet (0.4 mg total) under the tongue every 5 (five) minutes as needed for chest pain. 25 tablet 2  . pantoprazole (PROTONIX) 40 MG tablet Take 1 tablet (40 mg total) by mouth 2 (two) times daily. 60 tablet 5  . pyridostigmine (MESTINON) 60 MG tablet Take 60 mg by mouth 3 (three) times daily.     . rosuvastatin (CRESTOR) 40 MG tablet Take 1 tablet (40 mg total) by mouth daily. 90 tablet 3  . tiZANidine (ZANAFLEX) 4 MG tablet Take 4 mg by mouth daily as needed for pain.     No current facility-administered medications for this visit.      Allergies:   Lipitor [atorvastatin] and Tramadol   Social History:  The patient  reports that she quit smoking about 23 years ago. Her smoking use included cigarettes. She has never used smokeless tobacco. She reports that she does not drink alcohol or use drugs.   Family History:   family history includes Anxiety disorder in her father and mother; Bipolar disorder in her mother; Breast cancer in an other family member; Breast cancer (age of onset: 73) in her maternal aunt and maternal aunt; Colon cancer in her cousin and father; Depression in her father, mother, and sister; Diabetes in her father and sister; Heart disease in her mother; Heart disease (age of onset: 59) in her father; Hyperlipidemia in her mother and sister; Hypertension in her father, mother, sister, and sister; Kidney disease in her father and sister; Ovarian cancer in her cousin; Skin cancer in her father; Stroke in  her father; Thyroid nodules in her sister.    Review of Systems: Review of Systems  Constitutional: Negative.   Eyes: Negative.   Respiratory: Negative.   Cardiovascular: Negative.   Gastrointestinal: Negative.   Genitourinary: Negative.   Musculoskeletal: Negative.   Neurological: Negative.  Negative for dizziness.  Psychiatric/Behavioral: Negative.   All other systems reviewed and are negative.   PHYSICAL EXAM: VS:  BP 130/72 (BP Location: Left Arm, Patient Position: Lying right side, Cuff Size: Normal)   Pulse 74   Ht '5\' 3"'  (1.6 m)   Wt 180 lb (81.6 kg)   BMI 31.89 kg/m  , BMI Body mass index  is 31.89 kg/m.   Constitutional:  oriented to person, place, and time. No distress.  HENT:  Head: Grossly normal Eyes:  no discharge. No scleral icterus.  Neck: No JVD, no carotid bruits  Cardiovascular: Regular rate and rhythm, no murmurs appreciated Pulmonary/Chest: Clear to auscultation bilaterally, no wheezes or rales Abdominal: Soft.  no distension.  no tenderness.  Musculoskeletal: Normal range of motion Neurological:  normal muscle tone. Coordination normal. No atrophy Skin: Skin warm and dry Psychiatric: normal affect, pleasant   Recent Labs: 06/18/2017: BNP 317.4 07/09/2017: Magnesium 1.6 03/16/2018: ALT 25; BUN 18; Creatinine, Ser 2.14; Hemoglobin 9.2; Hemoglobin 9.2; Platelets 256; Platelets 255; Potassium 4.2; Sodium 134    Lipid Panel Lab Results  Component Value Date   CHOL 161 02/18/2018   HDL 31 (L) 02/18/2018   LDLCALC 109 (H) 02/18/2018   TRIG 103 02/18/2018      Wt Readings from Last 3 Encounters:  04/11/18 180 lb (81.6 kg)  04/04/18 169 lb (76.7 kg)  03/24/18 183 lb (83 kg)       ASSESSMENT AND PLAN:   Coronary artery disease involving native coronary artery of native heart with unstable angina pectoris ([place])  Denies any recent chest pain Continue aspirin Plavix, no further ischemic work-up needed  Pure hypercholesterolemia Plan: Stay on  her Crestor 40 mg daily and Zetia Numbers at goal Possibly secondary to medication compliance  Uncontrolled type 2 diabetes mellitus with other circulatory complication, without long-term current use of insulin ([place]) Working with endocrinology Stressed importance of diet control  Essential hypertension Plan: Blood pressure is well controlled on today's visit. No changes made to the medications. Stable  Cerebrovascular accident (CVA), unspecified mechanism ([place]) Plan: Previously seen by neurology at [facility] They felt her stroke was secondary to hypotension per the patient Recommend she cut back on her amlodipine to 5 mg daily  Chronic kidney disease (CKD), stage III (moderate) Plan: Followed by nephrology avoid Lasix and NSAIDs  Disposition:   F/U  6 months   Total encounter time more than 25 minutes  Greater than 50% was spent in counseling and coordination of care with the patient    Orders Placed This Encounter  Procedures  . EKG 12-Lead   I, Jesus Reyes am acting as a scribe for Ida Rogue, M.D., Ph.D.  I, Ida Rogue, M.D. Ph.D., have reviewed the above documentation for accuracy and completeness, and I agree with the above.    Signed, Esmond Plants, M.D., Ph.D. 04/11/2018  [place], [place]

## 2018-04-11 ENCOUNTER — Ambulatory Visit (INDEPENDENT_AMBULATORY_CARE_PROVIDER_SITE_OTHER): Payer: Medicare Other | Admitting: Cardiovascular Disease

## 2018-04-11 ENCOUNTER — Encounter: Payer: Self-pay | Admitting: Cardiovascular Disease

## 2018-04-11 VITALS — BP 130/72 | HR 74 | Ht 63.0 in | Wt 180.0 lb

## 2018-04-11 DIAGNOSIS — E1122 Type 2 diabetes mellitus with diabetic chronic kidney disease: Secondary | ICD-10-CM

## 2018-04-11 DIAGNOSIS — I25118 Atherosclerotic heart disease of native coronary artery with other forms of angina pectoris: Secondary | ICD-10-CM | POA: Diagnosis not present

## 2018-04-11 DIAGNOSIS — N183 Chronic kidney disease, stage 3 unspecified: Secondary | ICD-10-CM

## 2018-04-11 DIAGNOSIS — I5022 Chronic systolic (congestive) heart failure: Secondary | ICD-10-CM | POA: Diagnosis not present

## 2018-04-11 DIAGNOSIS — I639 Cerebral infarction, unspecified: Secondary | ICD-10-CM | POA: Diagnosis not present

## 2018-04-11 DIAGNOSIS — Z8673 Personal history of transient ischemic attack (TIA), and cerebral infarction without residual deficits: Secondary | ICD-10-CM

## 2018-04-11 NOTE — Patient Instructions (Signed)
   Medication Instructions:  No changes  If you need a refill on your cardiac medications before your next appointment, please call your pharmacy.    Lab work: No new labs needed   If you have labs (blood work) drawn today and your tests are completely normal, you will receive your results only by: . MyChart Message (if you have MyChart) OR . A paper copy in the mail If you have any lab test that is abnormal or we need to change your treatment, we will call you to review the results.   Testing/Procedures: No new testing needed   Follow-Up: At [facility], you and your health needs are our priority.  As part of our continuing mission to provide you with exceptional heart care, we have created designated Provider Care Teams.  These Care Teams include your primary Cardiologist (physician) and Advanced Practice Providers (APPs -  Physician Assistants and Nurse Practitioners) who all work together to provide you with the care you need, when you need it.  . You will need a follow up appointment in 6 months .   Please call our office 2 months in advance to schedule this appointment.    . Providers on your designated Care Team:   . Christopher Berge, NP . Ryan Dunn, PA-C . Jacquelyn Visser, PA-C  Any Other Special Instructions Will Be Listed Below (If Applicable).  For educational health videos Log in to : [email or web address] Or : [email or web address], password : [email or web address]   

## 2018-04-15 ENCOUNTER — Telehealth: Payer: Self-pay | Admitting: Cardiovascular Disease

## 2018-04-15 NOTE — Telephone Encounter (Addendum)
Patient states she has held plavix .  Last dose Tuesday  Last dose of ASA was 1 month ago

## 2018-04-15 NOTE — Telephone Encounter (Signed)
Verbal from Dr. [name], okay to proceed with dental extraction today.   I told the patient that in the future we would like 7-10 days notice, if possible, as she called [place] this morning for clearance. She agreed.   Per Dr. [name], pt to take 1 tablet (81 mg ) aspirin prior to her procedure and should start Plavix back tomorrow. He urged patient to never go without either plavix or aspirin in the future, given her cardiac hx.    Pt verbalized understanding and agreeable to POC.   Advised pt to call for any further questions or concerns.   Called dental office, "[facility] Dr. Ronnald Ramp DDS" to give verbal since procedure is due to start at 2pm. I let clerk know that I will be faxing clearance as soon as I complete note.   Clearance faxed to [phone]

## 2018-04-15 NOTE — Telephone Encounter (Signed)
° °  [facility] Pre-operative Risk Assessment    Request for surgical clearance:  1. What type of surgery is being performed? Full mouth Extractions    2. When is this surgery scheduled? 04/15/18 at 1 pm   3. What type of clearance is required (medical clearance vs. Pharmacy clearance to hold med vs. Both)? Both   4. Are there any medications that need to be held prior to surgery and how long? Please advise and also advise if inr is needed    5. Practice name and name of physician performing surgery?  [facility] Dr. Ronnald Ramp DDS  6. What is your office phone number [phone]    7.   What is your office fax number [phone]   8.   Anesthesia type (None, local, MAC, general) ? Articaine 4 % 1:100,000 with Epi and 0.5 % Marcaine HCL 1:200,000 with Epi    Clarisse Gouge 04/15/2018, 10:04 AM  _________________________________________________________________   (provider comments below)

## 2018-04-15 NOTE — Telephone Encounter (Signed)
If she is not on Plavix she needs aspirin daily Should not be without one or the other If surgery goes ahead today would go right back on Plavix

## 2018-04-18 ENCOUNTER — Ambulatory Visit: Payer: Self-pay | Admitting: Family Medicine

## 2018-04-18 NOTE — Progress Notes (Deleted)
Patient: [name] Female    DOB: October 08, 1970   48 y.o.   MRN: [id number] Visit Date: 04/18/2018  Today's Provider: [name], MD   No chief complaint on file.  Subjective:     HPI  Lipid/Cholesterol, Follow-up:   Last seen for this3 weeks ago.  Management changes since that visit include none, patient was advised to continue Crestor 35m qd. . Last Lipid Panel:    Component Value Date/Time   CHOL 161 02/18/2018 0313   CHOL 159 05/28/2016 1212   CHOL 197 12/30/2013 0355   TRIG 103 02/18/2018 0313   TRIG 261 (H) 12/30/2013 0355   HDL 31 (L) 02/18/2018 0313   HDL 34 (L) 05/28/2016 1212   HDL 28 (L) 12/30/2013 0355   CHOLHDL 5.2 02/18/2018 0313   VLDL 21 02/18/2018 0313   VLDL 52 (H) 12/30/2013 0355   LDLCALC 109 (H) 02/18/2018 0313   LDLCALC 103 (H) 12/08/2016 1221   LDLCALC 117 (H) 12/30/2013 0355   LDLDIRECT 83.3 01/26/2011 0913    Risk factors for vascular disease include diabetes mellitus, hypercholesterolemia and hypertension  She reports {excellent/good/fair/poor:19665} compliance with treatment. She {ACTION; IS/IS [id number]}having side effects.  Current symptoms include {Symptoms; diabetes:14075} and have been {Desc; course:15616}. Weight trend: {trend:16658} Prior visit with dietician: {yes/no:17258} Current diet: {diet habits:16563} Current exercise: {exercise types:16438}  Wt Readings from Last 3 Encounters:  04/11/18 180 lb (81.6 kg)  04/04/18 169 lb (76.7 kg)  03/24/18 183 lb (83 kg)    -------------------------------------------------------------------  Allergies  Allergen Reactions  . Lipitor [Atorvastatin] Other (See Comments)    Leg pains  . Tramadol Other (See Comments)    Mouth feels like it's on fire     Current Outpatient Medications:  .  ALPRAZolam (XANAX) 1 MG tablet, Take 1 tablet (1 mg total) by mouth at bedtime. (Patient taking differently: Take 1 mg by mouth at bedtime. Pt taking 2.5 mg daily.), Disp:  14 tablet, Rfl: 0 .  aspirin 81 MG chewable tablet, Chew 1 tablet (81 mg total) by mouth daily., Disp: 30 tablet, Rfl: 11 .  Blood Glucose Monitoring Suppl (ONE TOUCH ULTRA 2) w/Device KIT, Use to check blood sugar once a day. Dx. E11.9, Disp: 1 each, Rfl: 0 .  buPROPion (WELLBUTRIN XL) 150 MG 24 hr tablet, TAKE 1 TABLET BY MOUTH DAILY, Disp: 30 tablet, Rfl: 11 .  citalopram (CELEXA) 40 MG tablet, Take 1 tablet (40 mg total) by mouth daily., Disp: 30 tablet, Rfl: 5 .  clopidogrel (PLAVIX) 75 MG tablet, Take 1 tablet (75 mg total) by mouth daily with breakfast., Disp: 30 tablet, Rfl: 2 .  gabapentin (NEURONTIN) 300 MG capsule, TAKE 1 CAPSULE BY MOUTH TWICE A DAY (Patient taking differently: Take 300 mg by mouth 2 (two) times daily. ), Disp: 60 capsule, Rfl: 5 .  isosorbide mononitrate (IMDUR) 120 MG 24 hr tablet, Take 1 tablet (120 mg total) by mouth daily., Disp: 90 tablet, Rfl: 1 .  levothyroxine (SYNTHROID, LEVOTHROID) 25 MCG tablet, Take 1 tablet (25 mcg total) by mouth daily before breakfast., Disp: 30 tablet, Rfl: 12 .  metFORMIN (GLUCOPHAGE) 500 MG tablet, Take 500 mg by mouth 1 day or 1 dose., Disp: , Rfl:  .  metoprolol succinate (TOPROL-XL) 100 MG 24 hr tablet, Take 1/2 tablet daily, Disp: 3 tablet, Rfl: 0 .  nitroGLYCERIN (NITROSTAT) 0.4 MG SL tablet, Place 1 tablet (0.4 mg total) under the tongue every 5 (five) minutes as  needed for chest pain., Disp: 25 tablet, Rfl: 2 .  pantoprazole (PROTONIX) 40 MG tablet, Take 1 tablet (40 mg total) by mouth 2 (two) times daily., Disp: 60 tablet, Rfl: 5 .  pyridostigmine (MESTINON) 60 MG tablet, Take 60 mg by mouth 3 (three) times daily. , Disp: , Rfl:  .  rosuvastatin (CRESTOR) 40 MG tablet, Take 1 tablet (40 mg total) by mouth daily., Disp: 90 tablet, Rfl: 3 .  tiZANidine (ZANAFLEX) 4 MG tablet, Take 4 mg by mouth daily as needed for pain., Disp: , Rfl:   Review of Systems  Social History   Tobacco Use  . Smoking status: Former Smoker     Types: Cigarettes    Last attempt to quit: 08/31/1994    Years since quitting: 23.6  . Smokeless tobacco: Never Used  . Tobacco comment: quit 28 years ago  Substance Use Topics  . Alcohol use: No    Alcohol/week: 0.0 standard drinks      Objective:   There were no vitals taken for this visit. There were no vitals filed for this visit.   Physical Exam      Assessment & Plan        [name], MD  [facility]

## 2018-04-27 ENCOUNTER — Emergency Department
Admission: EM | Admit: 2018-04-27 | Discharge: 2018-04-27 | Disposition: A | Discharge status: Home / Self Care | Payer: Medicare Other | Attending: Emergency Medicine | Admitting: Emergency Medicine

## 2018-04-27 ENCOUNTER — Other Ambulatory Visit: Payer: Self-pay

## 2018-04-27 DIAGNOSIS — Z5321 Procedure and treatment not carried out due to patient leaving prior to being seen by health care provider: Secondary | ICD-10-CM | POA: Insufficient documentation

## 2018-04-27 DIAGNOSIS — R1110 Vomiting, unspecified: Secondary | ICD-10-CM | POA: Insufficient documentation

## 2018-04-27 LAB — URINALYSIS, COMPLETE (UACMP) WITH MICROSCOPIC
Bacteria, UA: NONE SEEN
Bilirubin Urine: NEGATIVE
Glucose, UA: 50 mg/dL — AB
HGB URINE DIPSTICK: NEGATIVE
Ketones, ur: NEGATIVE mg/dL
Leukocytes,Ua: NEGATIVE
NITRITE: NEGATIVE
PH: 6 (ref 5.0–8.0)
Protein, ur: 30 mg/dL — AB
Specific Gravity, Urine: 1.004 — ABNORMAL LOW (ref 1.005–1.030)
Squamous Epithelial / HPF: NONE SEEN (ref 0–5)

## 2018-04-27 LAB — COMPREHENSIVE METABOLIC PANEL WITH GFR
ALT: 36 U/L (ref 0–44)
AST: 42 U/L — ABNORMAL HIGH (ref 15–41)
Albumin: 3.5 g/dL (ref 3.5–5.0)
Alkaline Phosphatase: 116 U/L (ref 38–126)
Anion gap: 9 (ref 5–15)
BUN: 14 mg/dL (ref 6–20)
CO2: 24 mmol/L (ref 22–32)
Calcium: 8.6 mg/dL — ABNORMAL LOW (ref 8.9–10.3)
Chloride: 98 mmol/L (ref 98–111)
Creatinine, Ser: 1.33 mg/dL — ABNORMAL HIGH (ref 0.44–1.00)
GFR calc Af Amer: 54 mL/min — ABNORMAL LOW
GFR calc non Af Amer: 47 mL/min — ABNORMAL LOW
Glucose, Bld: 144 mg/dL — ABNORMAL HIGH (ref 70–99)
Potassium: 4.1 mmol/L (ref 3.5–5.1)
Sodium: 131 mmol/L — ABNORMAL LOW (ref 135–145)
Total Bilirubin: 0.5 mg/dL (ref 0.3–1.2)
Total Protein: 6.8 g/dL (ref 6.5–8.1)

## 2018-04-27 LAB — CBC
HCT: 29.7 % — ABNORMAL LOW (ref 36.0–46.0)
Hemoglobin: 8.9 g/dL — ABNORMAL LOW (ref 12.0–15.0)
MCH: 21.4 pg — ABNORMAL LOW (ref 26.0–34.0)
MCHC: 30 g/dL (ref 30.0–36.0)
MCV: 71.6 fL — ABNORMAL LOW (ref 80.0–100.0)
Platelets: 199 K/uL (ref 150–400)
RBC: 4.15 MIL/uL (ref 3.87–5.11)
RDW: 15.1 % (ref 11.5–15.5)
WBC: 9.3 K/uL (ref 4.0–10.5)
nRBC: 0 % (ref 0.0–0.2)

## 2018-04-27 LAB — LIPASE, BLOOD: Lipase: 54 U/L — ABNORMAL HIGH (ref 11–51)

## 2018-04-27 NOTE — ED Triage Notes (Signed)
Pt states she had several extracted last week and has had vomiting since. STates today not able to keep food or fluids down. Pt denies any diarrhea, and slight mid abd pain.

## 2018-04-28 ENCOUNTER — Telehealth: Payer: Self-pay

## 2018-04-28 ENCOUNTER — Encounter: Payer: Self-pay | Admitting: Emergency Medicine

## 2018-04-28 ENCOUNTER — Other Ambulatory Visit: Payer: Self-pay

## 2018-04-28 ENCOUNTER — Emergency Department
Admission: EM | Admit: 2018-04-28 | Discharge: 2018-04-29 | Disposition: A | Discharge status: Home / Self Care | Payer: Medicare Other | Attending: Emergency Medicine | Admitting: Emergency Medicine

## 2018-04-28 DIAGNOSIS — Z5321 Procedure and treatment not carried out due to patient leaving prior to being seen by health care provider: Secondary | ICD-10-CM | POA: Insufficient documentation

## 2018-04-28 DIAGNOSIS — R1110 Vomiting, unspecified: Secondary | ICD-10-CM | POA: Insufficient documentation

## 2018-04-28 LAB — CBC WITH DIFFERENTIAL/PLATELET
Abs Immature Granulocytes: 0.02 10*3/uL (ref 0.00–0.07)
Basophils Absolute: 0.1 10*3/uL (ref 0.0–0.1)
Basophils Relative: 1 %
Eosinophils Absolute: 0.2 10*3/uL (ref 0.0–0.5)
Eosinophils Relative: 3 %
HCT: 32.5 % — ABNORMAL LOW (ref 36.0–46.0)
Hemoglobin: 9.7 g/dL — ABNORMAL LOW (ref 12.0–15.0)
Immature Granulocytes: 0 %
Lymphocytes Relative: 15 %
Lymphs Abs: 1.1 10*3/uL (ref 0.7–4.0)
MCH: 21.6 pg — ABNORMAL LOW (ref 26.0–34.0)
MCHC: 29.8 g/dL — ABNORMAL LOW (ref 30.0–36.0)
MCV: 72.2 fL — ABNORMAL LOW (ref 80.0–100.0)
Monocytes Absolute: 0.4 10*3/uL (ref 0.1–1.0)
Monocytes Relative: 6 %
Neutro Abs: 5.6 10*3/uL (ref 1.7–7.7)
Neutrophils Relative %: 75 %
Platelets: 189 10*3/uL (ref 150–400)
RBC: 4.5 MIL/uL (ref 3.87–5.11)
RDW: 15 % (ref 11.5–15.5)
WBC: 7.4 10*3/uL (ref 4.0–10.5)
nRBC: 0 % (ref 0.0–0.2)

## 2018-04-28 LAB — COMPREHENSIVE METABOLIC PANEL
ALT: 43 U/L (ref 0–44)
AST: 50 U/L — ABNORMAL HIGH (ref 15–41)
Albumin: 3.6 g/dL (ref 3.5–5.0)
Alkaline Phosphatase: 121 U/L (ref 38–126)
Anion gap: 8 (ref 5–15)
BUN: 9 mg/dL (ref 6–20)
CO2: 24 mmol/L (ref 22–32)
Calcium: 8.6 mg/dL — ABNORMAL LOW (ref 8.9–10.3)
Chloride: 104 mmol/L (ref 98–111)
Creatinine, Ser: 1.35 mg/dL — ABNORMAL HIGH (ref 0.44–1.00)
GFR calc Af Amer: 53 mL/min — ABNORMAL LOW (ref >60)
GFR calc non Af Amer: 46 mL/min — ABNORMAL LOW (ref >60)
Glucose, Bld: 192 mg/dL — ABNORMAL HIGH (ref 70–99)
Potassium: 4 mmol/L (ref 3.5–5.1)
Sodium: 136 mmol/L (ref 135–145)
Total Bilirubin: 0.3 mg/dL (ref 0.3–1.2)
Total Protein: 7 g/dL (ref 6.5–8.1)

## 2018-04-28 LAB — LIPASE, BLOOD: Lipase: 64 U/L — ABNORMAL HIGH (ref 11–51)

## 2018-04-28 NOTE — ED Triage Notes (Signed)
Patient ambulatory to triage with steady gait, without difficulty or distress noted; pt reports N/V/D since yesterday; was here last night but left prior to being seen; denies any abd pain

## 2018-04-28 NOTE — Telephone Encounter (Signed)
If having any back or abdominal pain then need to be seen. If unable to keep down fluids then need to be seen If light headed or dizzy need to be seen If any blood in stools then need to be seen  Otherwise take frequent small sips of fluid and only eat bland foods such as crackers, breads and toast.

## 2018-04-28 NOTE — Telephone Encounter (Signed)
Patient calling that she is vomiting and went to the ED yesterday but left the ED. She reports no fever, no sob, no respiratories symptoms. Offered patient an office visit with another provider and stated she wanted Dr. Maralyn Sago advise on what to do first.

## 2018-04-29 ENCOUNTER — Ambulatory Visit (INDEPENDENT_AMBULATORY_CARE_PROVIDER_SITE_OTHER): Payer: Medicare Other | Admitting: Physician Assistant

## 2018-04-29 ENCOUNTER — Encounter: Payer: Self-pay | Admitting: Physician Assistant

## 2018-04-29 VITALS — BP 163/98 | HR 86 | Temp 98.4°F | Resp 16 | Wt 166.0 lb

## 2018-04-29 DIAGNOSIS — K529 Noninfective gastroenteritis and colitis, unspecified: Secondary | ICD-10-CM

## 2018-04-29 DIAGNOSIS — I639 Cerebral infarction, unspecified: Secondary | ICD-10-CM

## 2018-04-29 DIAGNOSIS — K591 Functional diarrhea: Secondary | ICD-10-CM

## 2018-04-29 MED ORDER — DIPHENOXYLATE-ATROPINE 2.5-0.025 MG PO TABS: 1.0000 | ORAL_TABLET | Freq: Four times a day (QID) | ORAL | 0 refills | Status: DC | PRN

## 2018-04-29 MED ORDER — PROMETHAZINE HCL 50 MG/ML IJ SOLN: 50.0000 mg | Freq: Once | INTRAMUSCULAR | Status: DC

## 2018-04-29 MED ORDER — ONDANSETRON HCL 4 MG PO TABS: 4.0000 mg | ORAL_TABLET | Freq: Three times a day (TID) | ORAL | 0 refills | Status: DC | PRN

## 2018-04-29 NOTE — Telephone Encounter (Signed)
Patient was seen office by Tawanna Sat today 04/29/2018.

## 2018-04-29 NOTE — Progress Notes (Signed)
Patient: [name] [place] Female    DOB: Apr 18, 1970   48 y.o.   MRN: [id number] Visit Date: 04/29/2018  Today's Provider: Mar Daring, PA-C   Chief Complaint  Patient presents with  . Diarrhea   Subjective:     HPI Patient here today c/o diarrhea and vomiting for 2 days. Patient reports she had her teeth pulled 2 weeks ago. Patient denies any fever or cough. Patient reports that she went to [facility] ER but left before she was seen. Patient reports she did have blood drawn at the ER but unsure of results.    Allergies  Allergen Reactions  . Lipitor [Atorvastatin] Other (See Comments)    Leg pains  . Tramadol Other (See Comments)    Mouth feels like it's on fire     Current Outpatient Medications:  .  ALPRAZolam (XANAX) 1 MG tablet, Take 1 tablet (1 mg total) by mouth at bedtime. (Patient taking differently: Take 1 mg by mouth at bedtime. Pt taking 2.5 mg daily.), Disp: 14 tablet, Rfl: 0 .  Blood Glucose Monitoring Suppl (ONE TOUCH ULTRA 2) w/Device KIT, Use to check blood sugar once a day. Dx. E11.9, Disp: 1 each, Rfl: 0 .  buPROPion (WELLBUTRIN XL) 150 MG 24 hr tablet, TAKE 1 TABLET BY MOUTH DAILY, Disp: 30 tablet, Rfl: 11 .  citalopram (CELEXA) 40 MG tablet, Take 1 tablet (40 mg total) by mouth daily., Disp: 30 tablet, Rfl: 5 .  gabapentin (NEURONTIN) 300 MG capsule, TAKE 1 CAPSULE BY MOUTH TWICE A DAY (Patient taking differently: Take 300 mg by mouth 2 (two) times daily. ), Disp: 60 capsule, Rfl: 5 .  isosorbide mononitrate (IMDUR) 120 MG 24 hr tablet, Take 1 tablet (120 mg total) by mouth daily., Disp: 90 tablet, Rfl: 1 .  levothyroxine (SYNTHROID, LEVOTHROID) 25 MCG tablet, Take 1 tablet (25 mcg total) by mouth daily before breakfast., Disp: 30 tablet, Rfl: 12 .  metFORMIN (GLUCOPHAGE) 500 MG tablet, Take 500 mg by mouth 1 day or 1 dose., Disp: , Rfl:  .  metoprolol succinate (TOPROL-XL) 100 MG 24 hr tablet, Take 1/2 tablet daily, Disp: 3 tablet, Rfl: 0 .   nitroGLYCERIN (NITROSTAT) 0.4 MG SL tablet, Place 1 tablet (0.4 mg total) under the tongue every 5 (five) minutes as needed for chest pain., Disp: 25 tablet, Rfl: 2 .  pantoprazole (PROTONIX) 40 MG tablet, Take 1 tablet (40 mg total) by mouth 2 (two) times daily., Disp: 60 tablet, Rfl: 5 .  rosuvastatin (CRESTOR) 40 MG tablet, Take 1 tablet (40 mg total) by mouth daily., Disp: 90 tablet, Rfl: 3 .  tiZANidine (ZANAFLEX) 4 MG tablet, Take 4 mg by mouth daily as needed for pain., Disp: , Rfl:   Review of Systems  Constitutional: Positive for activity change and appetite change. Negative for fatigue and fever.  HENT: Negative.   Respiratory: Negative.   Cardiovascular: Negative.   Gastrointestinal: Positive for abdominal distention, abdominal pain, diarrhea, nausea and vomiting.  Neurological: Negative.     Social History   Tobacco Use  . Smoking status: Former Smoker    Types: Cigarettes    Last attempt to quit: 08/31/1994    Years since quitting: 23.6  . Smokeless tobacco: Never Used  . Tobacco comment: quit 28 years ago  Substance Use Topics  . Alcohol use: No    Alcohol/week: 0.0 standard drinks      Objective:   BP (!) 163/98 (BP Location: Left Arm,  Patient Position: Sitting, Cuff Size: Large)   Pulse 86   Temp 98.4 F (36.9 C) (Oral)   Resp 16   Wt 166 lb (75.3 kg)   SpO2 97%   BMI 29.41 kg/m  Vitals:   04/29/18 1031  BP: (!) 163/98  Pulse: 86  Resp: 16  Temp: 98.4 F (36.9 C)  TempSrc: Oral  SpO2: 97%  Weight: 166 lb (75.3 kg)     Physical Exam Constitutional:      General: She is not in acute distress.    Appearance: Normal appearance. She is well-developed. She is not diaphoretic.  Cardiovascular:     Rate and Rhythm: Normal rate and regular rhythm.     Heart sounds: Normal heart sounds. No murmur. No friction rub. No gallop.   Pulmonary:     Effort: Pulmonary effort is normal. No respiratory distress.     Breath sounds: Normal breath sounds. No  wheezing or rales.  Abdominal:     General: Bowel sounds are normal. There is no distension.     Palpations: Abdomen is soft. There is no mass.     Tenderness: There is abdominal tenderness in the right upper quadrant, epigastric area and left lower quadrant. There is no guarding or rebound.     Comments: Gallbladder removed; s/p bariatric surgery  Skin:    General: Skin is warm and dry.  Neurological:     Mental Status: She is alert and oriented to person, place, and time.         Assessment & Plan    1. Gastroenteritis Phenergan IM given today in the office. Patient has standing order of promethazine 58m at home that she has had since her bariatric surgery. This has not been working well. Will change treatment to Zofran as below. Information printed for clear liquid diet and bowel rest. Stay hydrated. Call if no improvements or worsening.  - ondansetron (ZOFRAN) 4 MG tablet; Take 1 tablet (4 mg total) by mouth every 8 (eight) hours as needed.  Dispense: 20 tablet; Refill: 0 - promethazine (PHENERGAN) injection 50 mg  2. Functional diarrhea Known history of this. Has seen GI. Used Lomotil with success. Refilled as below.  - diphenoxylate-atropine (LOMOTIL) 2.5-0.025 MG tablet; Take 1 tablet by mouth 4 (four) times daily as needed for diarrhea or loose stools.  Dispense: 120 tablet; Refill: 0     JMar Daring PA-C  [facility]

## 2018-04-29 NOTE — Patient Instructions (Addendum)
Clear Liquid Diet, Adult A clear liquid diet is a diet that includes only liquids and semi-liquids that you can see through. You do not eat any food on this diet. Most people need to follow this diet for only a short period of time. You may need to follow a clear liquid diet if:  You develop a medical condition right before or after you have surgery.  You were not able to eat food for a long period of time.  You had a condition that gave you nausea, vomiting, or diarrhea.  You are going to have a procedure or exam to look at parts of your digestive system.  You are going to have bowel surgery. The usual goals of this diet are:  To rest the stomach and digestive system as much as possible.  To help you clear the digestive system before a procedure or exam.  To keep you hydrated.  To make sure you get some calories for energy.  To help you return to your normal eating routine. What are tips for following this plan?  A clear liquid is a liquid or semi-liquid, such as gelatin, that you can see through when you hold it up to a light.  A clear liquid diet does not provide all the nutrients that you need. It is important to choose a variety of the liquids or semi-liquids that are allowed on this diet. That way, you will get as many nutrients as possible.  If you are not sure whether you can have certain items, ask your health care provider. If you have difficulty swallowing thin liquids, you will need to thicken them to prevent breathing in (aspiration). What foods should I eat?   Water and flavored water.  Fruit juices that do not have pulp, such as cranberry juice and apple juice.  Tea and coffee without milk or cream.  Clear bouillon or broth.  Broth-based soups that have been strained.  Flavored gelatin.  Honey.  Sugar water.  Ice or frozen ice pops that do not contain milk, yogurt, fruit pieces, or fruit pulp.  Clear sodas.  Clear sports drinks. The items listed  above may not be a complete list of recommended foods and beverages. Contact a dietitian for more information. What food should I avoid?  Juices that have pulp.  Milk.  Cream or cream-based soups.  Yogurt.  Regular foods that are not clear liquids or semi-liquids. The items listed above may not be a complete list of foods and beverages to avoid. Contact a dietitian for more information. Questions to ask your health care provider:  How long do I need to follow this diet?  Are there any medicines that I should change while on this diet? Summary  A clear liquid diet is a diet that includes liquids and semi-liquids that you can see through.  The goal of this diet is to help you recover by resting your digestive system, keeping you hydrated, and providing nutrients.  Avoid liquids with milk, cream, or pulp while on this diet. This information is not intended to replace advice given to you by your health care provider. Make sure you discuss any questions you have with your health care provider. Document Released: 01/19/2005 Document Revised: 07/12/2017 Document Reviewed: 07/12/2017 Elsevier Interactive Patient Education  2019 [place] Diet A bland diet consists of foods that are often soft and do not have a lot of fat, fiber, or extra seasonings. Foods without fat, fiber, or seasoning are easier for  the body to digest. They are also less likely to irritate your mouth, throat, stomach, and other parts of your digestive system. A bland diet is sometimes called a BRAT diet. What is my plan? Your health care provider or food and nutrition specialist (dietitian) may recommend specific changes to your diet to prevent symptoms or to treat your symptoms. These changes may include:  Eating small meals often.  Cooking food until it is soft enough to chew easily.  Chewing your food well.  Drinking fluids slowly.  Not eating foods that are very spicy, sour, or fatty.  Not  eating citrus fruits, such as oranges and grapefruit. What do I need to know about this diet?  Eat a variety of foods from the bland diet food list.  Do not follow a bland diet longer than needed.  Ask your health care provider whether you should take vitamins or supplements. What foods can I eat? Grains  Hot cereals, such as cream of wheat. Rice. Bread, crackers, or tortillas made from refined white flour. Vegetables Canned or cooked vegetables. Mashed or boiled potatoes. Fruits  Bananas. Applesauce. Other types of cooked or canned fruit with the skin and seeds removed, such as canned peaches or pears. Meats and other proteins  Scrambled eggs. Creamy peanut butter or other nut butters. Lean, well-cooked meats, such as chicken or fish. Tofu. Soups or broths. Dairy Low-fat dairy products, such as milk, cottage cheese, or yogurt. Beverages  Water. Herbal tea. Apple juice. Fats and oils Mild salad dressings. Canola or olive oil. Sweets and desserts Pudding. Custard. Fruit gelatin. Ice cream. The items listed above may not be a complete list of recommended foods and beverages. Contact a dietitian for more options. What foods are not recommended? Grains Whole grain breads and cereals. Vegetables Raw vegetables. Fruits Raw fruits, especially citrus, berries, or dried fruits. Dairy Whole fat dairy foods. Beverages Caffeinated drinks. Alcohol. Seasonings and condiments Strongly flavored seasonings or condiments. Hot sauce. Salsa. Other foods Spicy foods. Fried foods. Sour foods, such as pickled or fermented foods. Foods with high sugar content. Foods high in fiber. The items listed above may not be a complete list of foods and beverages to avoid. Contact a dietitian for more information. Summary  A bland diet consists of foods that are often soft and do not have a lot of fat, fiber, or extra seasonings.  Foods without fat, fiber, or seasoning are easier for the body to  digest.  Check with your health care provider to see how long you should follow this diet plan. It is not meant to be followed for long periods. This information is not intended to replace advice given to you by your health care provider. Make sure you discuss any questions you have with your health care provider. Document Released: 05/13/2015 Document Revised: 02/17/2017 Document Reviewed: 02/17/2017 Elsevier Interactive Patient Education  2019 [employer]  Viral Gastroenteritis, Adult  Viral gastroenteritis is also known as the stomach flu. This condition is caused by various viruses. These viruses can be passed from person to person very easily (are very contagious). This condition may affect your stomach, small intestine, and large intestine. It can cause sudden watery diarrhea, fever, and vomiting. Diarrhea and vomiting can make you feel weak and cause you to become dehydrated. You may not be able to keep fluids down. Dehydration can make you tired and thirsty, cause you to have a dry mouth, and decrease how often you urinate. Older adults and people with other diseases  or a weak immune system are at higher risk for dehydration. It is important to replace the fluids that you lose from diarrhea and vomiting. If you become severely dehydrated, you may need to get fluids through an IV tube. What are the causes? Gastroenteritis is caused by various viruses, including rotavirus and norovirus. Norovirus is the most common cause in adults. You can get sick by eating food, drinking water, or touching a surface contaminated with one of these viruses. You can also get sick from sharing utensils or other personal items with an infected person. What increases the risk? This condition is more likely to develop in people:  Who have a weak defense system (immune system).  Who live with one or more children who are younger than 45 years old.  Who live in a nursing home.  Who go on cruise ships. What  are the signs or symptoms? Symptoms of this condition start suddenly 1-2 days after exposure to a virus. Symptoms may last a few days or as long as a week. The most common symptoms are watery diarrhea and vomiting. Other symptoms include:  Fever.  Headache.  Fatigue.  Pain in the abdomen.  Chills.  Weakness.  Nausea.  Muscle aches.  Loss of appetite. How is this diagnosed? This condition is diagnosed with a medical history and physical exam. You may also have a stool test to check for viruses or other infections. How is this treated? This condition typically goes away on its own. The focus of treatment is to restore lost fluids (rehydration). Your health care provider may recommend that you take an oral rehydration solution (ORS) to replace important salts and minerals (electrolytes) in your body. Severe cases of this condition may require giving fluids through an IV tube. Treatment may also include medicine to help with your symptoms. Follow these instructions at home:  Follow instructions from your health care provider about how to care for yourself at home. Follow these recommendations as told by your health care provider:  Take an ORS. This is a drink that is sold at pharmacies and retail stores.  Drink clear fluids in small amounts as you are able. Clear fluids include water, ice chips, diluted fruit juice, and low-calorie sports drinks.  Eat bland, easy-to-digest foods in small amounts as you are able. These foods include bananas, applesauce, rice, lean meats, toast, and crackers.  Avoid fluids that contain a lot of sugar or caffeine, such as energy drinks, sports drinks, and soda.  Avoid alcohol.  Avoid spicy or fatty foods. General instructions  Drink enough fluid to keep your urine clear or pale yellow.  Wash your hands often. If soap and water are not available, use hand sanitizer.  Make sure that all people in your household wash their hands well and often.   Take over-the-counter and prescription medicines only as told by your health care provider.  Rest at home while you recover.  Watch your condition for any changes.  Take a warm bath to relieve any burning or pain from frequent diarrhea episodes.  Keep all follow-up visits as told by your health care provider. This is important. Contact a health care provider if:  You cannot keep fluids down.  Your symptoms get worse.  You have new symptoms.  You feel light-headed or dizzy.  You have muscle cramps. Get help right away if:  You have chest pain.  You feel extremely weak or you faint.  You see blood in your vomit.  Your vomit looks  like coffee grounds.  You have bloody or black stools or stools that look like tar.  You have a severe headache, a stiff neck, or both.  You have a rash.  You have severe pain, cramping, or bloating in your abdomen.  You have trouble breathing or you are breathing very quickly.  Your heart is beating very quickly.  Your skin feels cold and clammy.  You feel confused.  You have pain when you urinate.  You have signs of dehydration, such as: ? Dark urine, very little urine, or no urine. ? Cracked lips. ? Dry mouth. ? Sunken eyes. ? Sleepiness. ? Weakness. This information is not intended to replace advice given to you by your health care provider. Make sure you discuss any questions you have with your health care provider. Document Released: 01/19/2005 Document Revised: 09/03/2016 Document Reviewed: 09/25/2014 Elsevier Interactive Patient Education  2019 [employer]   Acute Pancreatitis  Acute pancreatitis happens when the pancreas gets swollen. The pancreas is a large gland behind the stomach. The pancreas helps control blood sugar. It also makes enzymes that help digest food. This condition happens when the enzymes attack the pancreas and damage it. Most attacks last a couple of days and are dangerous. The lungs, heart, and  kidneys may stop working. What are the causes?  Alcohol abuse.  Drug abuse.  Gallstones.  Some medicines.  Some chemicals.  Infection.  Damage caused by an accident.[name] (abdominal) surgery.  In some cases, the cause is not known. What are the signs or symptoms?  Pain in the upper belly and back.  Swelling of the belly  Feeling sick to your stomach (nausea) and throwing up (vomiting). How is this treated?  You will probably have to stay in the hospital. ? Treatment may include:  Fluid through an IV.  A tube to remove stomach contents and stop you from throwing up.  Not eating for 3-4 days.  Pain medicine.  Antibiotic medicines if you have an infection.  Surgery on the pancreas or gallbladder. Follow these instructions at home: Eating and drinking   Follow instructions from your doctor about diet.  Eat small meals often. Avoid eating big meals.  Eat foods that do not have a lot of fat in them.  Drink enough fluid to keep your pee (urine) pale yellow.  Do not drink alcohol if it caused your condition. General instructions  Take over-the-counter and prescription medicines only as told by your doctor.  Do not use cigarettes, e-cigarettes, and chewing tobacco. If you need help quitting, ask your doctor.  Get plenty of rest.  If directed, check your blood sugar at home as told by your doctor.  Keep all follow-up visits as told by your doctor. This is important. Contact a doctor if:  You do not get better as quickly as expected.  You have new symptoms.  Your symptoms get worse.  You have lasting pain or weakness.  You continue to feel sick to your stomach.  You get better and then you have another pain attack.  You have a fever. Get help right away if:  You cannot eat or keep fluids down.  Your pain becomes very bad.  Your skin or the white part of your eyes turns yellow.  You throw up.  You feel dizzy or you pass out.  Your  blood sugar is high (over 300 mg/dL). Summary  Acute pancreatitis happens when the pancreas gets swollen.  This condition is usually caused by  alcohol abuse, drug abuse, or gallstones.  You will probably have to stay in the hospital for treatment. This information is not intended to replace advice given to you by your health care provider. Make sure you discuss any questions you have with your health care provider. Document Released: 07/08/2007 Document Revised: 05/25/2016 Document Reviewed: 10/23/2014 Elsevier Interactive Patient Education  2019 [employer].   Pancreatitis Eating Plan Pancreatitis is when your pancreas becomes irritated and swollen (inflamed). The pancreas is a small organ located behind your stomach. It helps your body digest food and regulate your blood sugar. Pancreatitis can affect how your body digests food, especially foods with fat. You may also have other symptoms such as abdominal pain or nausea. When you have pancreatitis, following a low-fat eating plan may help you manage symptoms and recover more quickly. Work with your health care provider or a diet and nutrition specialist (dietitian) to create an eating plan that is right for you. What are tips for following this plan? Reading food labels Use the information on food labels to help keep track of how much fat you eat:  Check the serving size.  Look for the amount of total fat in grams (g) in one serving. ? Low-fat foods have 3 g of fat or less per serving. ? Fat-free foods have 0.5 g of fat or less per serving.  Keep track of how much fat you eat based on how many servings you eat. ? For example, if you eat two servings, the amount of fat you eat will be two times what is listed on the label. Shopping   Buy low-fat or nonfat foods, such as: ? Fresh, frozen, or canned fruits and vegetables. ? Grains, including pasta, bread, and rice. ? Lean meat, poultry, fish, and other protein foods. ? Low-fat or  nonfat dairy.  Avoid buying bakery products and other sweets made with whole milk, butter, and eggs.  Avoid buying snack foods with added fat, such as anything with butter or cheese flavoring. Cooking  Remove skin from poultry, and remove extra fat from meat.  Limit the amount of fat and oil you use to 6 teaspoons or less per day.  Cook using low-fat methods, such as boiling, broiling, grilling, steaming, or baking.  Use spray oil to cook. Add fat-free chicken broth to add flavor and moisture.  Avoid adding cream to thicken soups or sauces. Use other thickeners such as corn starch or tomato paste. Meal planning   Eat a low-fat diet as told by your dietitian. For most people, this means having no more than 55-65 grams of fat each day.  Eat small, frequent meals throughout the day. For example, you may have 5-6 small meals instead of 3 large meals.  Drink enough fluid to keep your urine pale yellow.  Do not drink alcohol. Talk to your health care provider if you need help stopping.  Limit how much caffeine you have, including black coffee, black and green tea, caffeinated soft drinks, and energy drinks. General information  Let your health care provider or dietitian know if you have unplanned weight loss on this eating plan.  You may be instructed to follow a clear liquid diet during a flare of symptoms. Talk with your health care provider about how to manage your diet during symptoms of a flare.  Take any vitamins or supplements as told by your health care provider.  Work with a Microbiologist, especially if you have other conditions such as obesity or diabetes mellitus.  What foods should I avoid? Fruits Fried fruits. Fruits served with butter or cream. Vegetables Fried vegetables. Vegetables cooked with butter, cheese, or cream. Grains Biscuits, waffles, donuts, pastries, and croissants. Pies and cookies. Butter-flavored popcorn. Regular crackers. Meats and other protein foods  Fatty cuts of meat. Poultry with skin. Organ meats. Bacon, sausage, and cold cuts. Whole eggs. Nuts and nut butters. Dairy Whole and 2% milk. Whole milk yogurt. Whole milk ice cream. Cream and half-and-half. Cream cheese. Sour cream. Cheese. Beverages Wine, beer, and liquor. The items listed above may not be a complete list of foods and beverages to avoid. Contact a dietitian for more information. Summary  Pancreatitis can affect how your body digests food, especially foods with fat.  When you have pancreatitis, it is recommended that you follow a low-fat eating plan to help you recover more quickly and manage symptoms. For most people, this means limiting fat to no more than 55-65 grams per day.  Do not drink alcohol. Limit the amount of caffeine you have, and drink enough fluid to keep your urine pale yellow. This information is not intended to replace advice given to you by your health care provider. Make sure you discuss any questions you have with your health care provider. Document Released: 04/27/2017 Document Revised: 04/27/2017 Document Reviewed: 04/27/2017 Elsevier Interactive Patient Education  2019 [employer].

## 2018-05-03 ENCOUNTER — Telehealth: Payer: Self-pay

## 2018-05-03 DIAGNOSIS — R748 Abnormal levels of other serum enzymes: Secondary | ICD-10-CM

## 2018-05-03 DIAGNOSIS — R1013 Epigastric pain: Secondary | ICD-10-CM

## 2018-05-03 NOTE — Telephone Encounter (Signed)
Patient stated she saw you on 04/29/2018 and she was advised to call you back if any pain to have a order for ultrasound placed. Please advise.

## 2018-05-03 NOTE — Telephone Encounter (Signed)
US ordered

## 2018-05-04 ENCOUNTER — Telehealth: Payer: Self-pay | Admitting: Cardiovascular Disease

## 2018-05-04 NOTE — Telephone Encounter (Signed)
Patient calling. States [name] has fallen 3 times since Saturday (once Saturday, Monday and then today.) Patient's son was home Saturday and Monday and caught the patient and safely got her in a safe place. Today, patient said [name] was in the kitchen preparing her son's lunch for work, when [name] suddenly felt tired and weak.  [name] went to sit down and does not remember anything until her son was helping her up. [name] reports no lacerations, denies headache or blurred vision.  Current vital signs taken by her son who is EMT - 173/70, HR 52. Denies chest pain, shortness of breath, nausea or vomiting.  Pt verbalized understanding to call 911 or go to the emergency room, if [name] develops any new or worsening symptoms. In the meantime, patient scheduled for eVisit with Dr [name] tomorrow afternoon to discuss.  Transferred to [place] to go over instructions for video visit. Routing to Dr [name] as Juluis Rainier.       Virtual Visit Pre-Appointment Phone Call  Steps For Call:  1. Confirm consent - "In the setting of the current Covid19 crisis, you are scheduled for a (phone or video) visit with your provider on (date) at (time).  Just as we do with many in-office visits, in order for you to participate in this visit, we must obtain consent.  If you'd like, I can send this to your mychart (if signed up) or email for you to review.  Otherwise, I can obtain your verbal consent now.  All virtual visits are billed to your insurance company just like a normal visit would be.  By agreeing to a virtual visit, we'd like you to understand that the technology does not allow for your provider to perform an examination, and thus may limit your provider's ability to fully assess your condition.  Finally, though the technology is pretty good, we cannot assure that it will always work on either your or our end, and in the setting of a video visit, we may have to convert it to a phone-only visit.  In either situation, we  cannot ensure that we have a secure connection.  Are you willing to proceed?"  2. Give patient instructions for WebEx download to smartphone as below if video visit  3. Advise patient to be prepared with any vital sign or heart rhythm information, their current medicines, and a piece of paper and pen handy for any instructions they may receive the day of their visit  4. Inform patient they will receive a phone call 15 minutes prior to their appointment time (may be from unknown caller ID) so they should be prepared to answer  5. Confirm that appointment type is correct in Epic appointment notes (video vs telephone)    TELEPHONE CALL NOTE  [place] has been deemed a candidate for a follow-up tele-health visit to limit community exposure during the Covid-19 pandemic. I spoke with the patient via phone to ensure availability of phone/video source, confirm preferred email & phone number, and discuss instructions and expectations.  I reminded [place] to be prepared with any vital sign and/or heart rhythm information that could potentially be obtained via home monitoring, at the time of her visit. I reminded Marqueta Mckinsey Keagle to expect a phone call at the time of her visit if her visit.  Did the patient verbally acknowledge consent to treatment? YES  Roney Jaffe, RN 05/04/2018 3:08 PM   DOWNLOADING THE WEBEX SOFTWARE TO SMARTPHONE  - If  Apple, go to [employer] and type in WebEx in the search bar. [employer], the blue/green circle. The app is free but as with any other app downloads, their phone may require them to verify saved payment information or Apple password. The patient does NOT have to create an account.  - If Android, ask patient to go to [employer] and type in WebEx in the search bar. [employer], the blue/green circle. The app is free but as with any other app downloads, their phone may require them to  verify saved payment information or Android password. The patient does NOT have to create an account.   CONSENT FOR TELE-HEALTH VISIT - PLEASE REVIEW  I hereby voluntarily request, consent and authorize [place] and its employed or contracted physicians, physician assistants, nurse practitioners or other licensed health care professionals (the Practitioner), to provide me with telemedicine health care services (the Services") as deemed necessary by the treating Practitioner. I acknowledge and consent to receive the Services by the Practitioner via telemedicine. I understand that the telemedicine visit will involve communicating with the Practitioner through live audiovisual communication technology and the disclosure of certain medical information by electronic transmission. I acknowledge that I have been given the opportunity to request an in-person assessment or other available alternative prior to the telemedicine visit and am voluntarily participating in the telemedicine visit.  I understand that I have the right to withhold or withdraw my consent to the use of telemedicine in the course of my care at any time, without affecting my right to future care or treatment, and that the Practitioner or I may terminate the telemedicine visit at any time. I understand that I have the right to inspect all information obtained and/or recorded in the course of the telemedicine visit and may receive copies of available information for a reasonable fee.  I understand that some of the potential risks of receiving the Services via telemedicine include:   Delay or interruption in medical evaluation due to technological equipment failure or disruption;  Information transmitted may not be sufficient (e.g. poor resolution of images) to allow for appropriate medical decision making by the Practitioner; and/or   In rare instances, security protocols could fail, causing a breach of personal health  information.  Furthermore, I acknowledge that it is my responsibility to provide information about my medical history, conditions and care that is complete and accurate to the best of my ability. I acknowledge that Practitioner's advice, recommendations, and/or decision may be based on factors not within their control, such as incomplete or inaccurate data provided by me or distortions of diagnostic images or specimens that may result from electronic transmissions. I understand that the practice of medicine is not an exact science and that Practitioner makes no warranties or guarantees regarding treatment outcomes. I acknowledge that I will receive a copy of this consent concurrently upon execution via email to the email address I last provided but may also request a printed copy by calling the office of [facility].    I understand that my insurance will be billed for this visit.   I have read or had this consent read to me.  I understand the contents of this consent, which adequately explains the benefits and risks of the Services being provided via telemedicine.   I have been provided ample opportunity to ask questions regarding this consent and the Services and have had my questions answered to my satisfaction.  I give my  informed consent for the services to be provided through the use of telemedicine in my medical care  By participating in this telemedicine visit I agree to the above.

## 2018-05-04 NOTE — Telephone Encounter (Signed)
Pt states she has fallen 3 times since this past Saturday. Pt c/o BP issue: STAT if pt c/o blurred vision, one-sided weakness or slurred speech  1. What are your last 5 BP readings?  Just now 137/73 HR 53   2. Are you having any other symptoms (ex. Dizziness, headache, blurred vision, passed out)? "Feels funny", felt tired, went to sit down and fell  3. What is your BP issue? Pt thinks her HR is too low.

## 2018-05-05 ENCOUNTER — Telehealth (INDEPENDENT_AMBULATORY_CARE_PROVIDER_SITE_OTHER): Payer: Medicare Other | Admitting: Cardiovascular Disease

## 2018-05-05 ENCOUNTER — Other Ambulatory Visit: Payer: Self-pay

## 2018-05-05 DIAGNOSIS — I130 Hypertensive heart and chronic kidney disease with heart failure and stage 1 through stage 4 chronic kidney disease, or unspecified chronic kidney disease: Secondary | ICD-10-CM

## 2018-05-05 DIAGNOSIS — Z8673 Personal history of transient ischemic attack (TIA), and cerebral infarction without residual deficits: Secondary | ICD-10-CM

## 2018-05-05 DIAGNOSIS — I25118 Atherosclerotic heart disease of native coronary artery with other forms of angina pectoris: Secondary | ICD-10-CM

## 2018-05-05 DIAGNOSIS — N183 Chronic kidney disease, stage 3 unspecified: Secondary | ICD-10-CM

## 2018-05-05 DIAGNOSIS — I10 Essential (primary) hypertension: Secondary | ICD-10-CM

## 2018-05-05 DIAGNOSIS — I255 Ischemic cardiomyopathy: Secondary | ICD-10-CM

## 2018-05-05 DIAGNOSIS — R55 Syncope and collapse: Secondary | ICD-10-CM | POA: Diagnosis not present

## 2018-05-05 DIAGNOSIS — I119 Hypertensive heart disease without heart failure: Secondary | ICD-10-CM

## 2018-05-05 DIAGNOSIS — I5022 Chronic systolic (congestive) heart failure: Secondary | ICD-10-CM | POA: Diagnosis not present

## 2018-05-05 MED ORDER — METOPROLOL SUCCINATE ER 25 MG PO TB24: 25.0000 mg | ORAL_TABLET | Freq: Every day | ORAL | 3 refills | Status: DC

## 2018-05-05 MED ORDER — AMLODIPINE BESYLATE 5 MG PO TABS: 5.0000 mg | ORAL_TABLET | Freq: Two times a day (BID) | ORAL | 3 refills | Status: DC

## 2018-05-05 NOTE — Progress Notes (Addendum)
Virtual Visit via Video Note   This visit type was conducted due to national recommendations for restrictions regarding the COVID-19 Pandemic (e.g. social distancing) in an effort to limit this patient's exposure and mitigate transmission in our community.  Due to her co-morbid illnesses, this patient is at least at moderate risk for complications without adequate follow up.  This format is felt to be most appropriate for this patient at this time.  All issues noted in this document were discussed and addressed.  A limited physical exam was performed with this format.  Please refer to the patient's chart for her consent to telehealth for [facility].    Date:  05/05/2018   ID:  [id number], [place] 1970-10-01, MRN [id number]  Patient Location:  [address] [place] [address]   Provider location:   [facility], [place] office  PCP:  [name], MD  Cardiologist:  Patsy Baltimore  Chief Complaint: Near syncope  WebCam visit on today's discussion with [email or web address]   History of Present Illness:    [name] is a 48 y.o. female who presents via audio/video conferencing for a telehealth visit today.   The patient does not symptoms concerning for COVID-19 infection (fever, chills, cough, or new SHORTNESS OF BREATH).   Patient has a past medical history of smoking,  strokes, 2010 chest pain dating back to 2005  CAD, Previous LAD stent  normal LV function,   s/p gastric bypass surgery,  Baseline creatinine 1.5, previously taken off her diuretic Catheterization May 2017 with stent placed to her first diagonal, there was residual 50% distal LAD disease, 30% proximal, 40% proximal circumflex Who presents for routine followup of her coronary artery disease  On her last clinic visit she reported she had tripped.  hit concrete and went to the ED where she was diagnosed with a concussion.   ED on 03/16/18 for hypotension   blood pressures  (72/52), dizziness, nausea, and syncope. She is unaware of the cause for the low blood pressure. She felt a better as soon as they gave her fluids in the ED.  fallen 3 times past week, she feels it is orthostasis or low pulse Patient's son  caught the patient   in the kitchen preparing her son's lunch for work, when she suddenly felt tired and weak.  She went to sit down and does not remember anything until her son was helping her up. She reports no lacerations, denies headache or blurred vision.  Current vital signs taken by her son who is EMT - 137/70, HR 52. Denies chest pain, shortness of breath, nausea or vomiting.  Yesterday 962 systolic Right now 952 systolic, denies any significant symptoms Prior history of med noncompliance, reports she is taking her medications  She cut back on the metoprolol to 25 from 50 mg daily Feels somewhat better today No regular exercise program Weight has been trending upwards, poor diet  Total Chol 161/ LDL 109 HBA1C 5.8 CR 2.14 down to 1.35 Glucose 192 Hematocrit 32   Prior CV studies:   The following studies were reviewed today:  Previous cardiac catheterization results discussed with her in detail Cardiac catheterization September 20, 2017 Significant one-vessel coronary artery disease with patent stents in the mid LAD and first diagonal.   significant chronic disease in the distal LAD close to the apex. Severe restenosis  ostial second diagonal at the site of previous angioplasty in May 2019. Successful cutting balloon angioplasty  of the ostial second diagonal without stent placement.    Past Medical History:  Diagnosis Date   Acute pyelonephritis    Anemia    iron deficiency anemia   Aortic arch aneurysm ([place])    Bipolar disorder ([place])    BRCA negative 2014   CAD (coronary artery disease)    a. 08/2003 Cath: LAD 30-40-med Rx; b. 11/2014 PCI: LAD 23m(3.25x23 Xience Alpine DES); c. 06/2015 PCI: D1 (2.25x12 Resolute Integrity  DES); d. 06/2017 PCI: Patent mLAD stent, D2 95 (PTCA); e. 09/2017 PCI: D2 99ost (CBA); d. 12/2017 Cath: LM nl, LAD 334m80d (small), D1 40ost, D2 95ost, LCX 40p, RCA 40ost/p->Med rx for D2 given restenosis.   CKD (chronic kidney disease), stage III (HCC)    Colon polyp    CVA (cerebral vascular accident) ([facility]   Left side weakness.    Diabetes ([facility]   Family history of breast cancer    BRCA neg 2014   Gastric ulcer 04/27/2011   History of echocardiogram    a. 03/2017 Echo: EF 60-65%, no rwma; b. 02/2018 Echo: EF 60-65%, no rwma. Nl RV fxn. No cardiac source of emboli (admitted w/ stroke).   HTN (hypertension)    Hyperlipemia    Hypothyroid    Malignant melanoma of skin of scalp (HCC)    MI, acute, non ST segment elevation (HCC)    Neuromuscular disorder (HCC)    Orthostatic hypotension    S/P drug eluting coronary stent placement 06/04/2015   Sepsis ([facility]/14/2019   Stroke ([facility]   a. 02/2018 MRI: 43m22mate acute/early subacute L medial frontal lobe inarct; b. 02/2018 MRA No large vessel occlusion or aneurysm. Mod to sev L P2 stenosis. thready L vertebral artery, diffusely dzs'd; c. 02/2018 Carotid U/S: <50% bilat ICA dzs.   Past Surgical History:  Procedure Laterality Date   APPENDECTOMY     CARDIAC CATHETERIZATION N/A 11/09/2014   Procedure: Coronary Angiography;  Surgeon: TimMinna MerrittsD;  [facility];  Service: Cardiovascular;  Laterality: N/A;   CARDIAC CATHETERIZATION N/A 11/12/2014   Procedure: Coronary Stent Intervention;  Surgeon: AleIsaias CowmanD;  [facility];  Service: Cardiovascular;  Laterality: N/A;   CARDIAC CATHETERIZATION N/A 04/18/2015   Procedure: Left Heart Cath and Coronary Angiography;  Surgeon: TimMinna MerrittsD;  [facility];  Service: Cardiovascular;  Laterality: N/A;   CARDIAC CATHETERIZATION Left 06/04/2015   Procedure: Left Heart Cath and Coronary Angiography;  Surgeon:  MuhWellington HampshireD;  [facility];  Service: Cardiovascular;  Laterality: Left;   CARDIAC CATHETERIZATION N/A 06/04/2015   Procedure: Coronary Stent Intervention;  Surgeon: MuhWellington HampshireD;  [facility];  Service: Cardiovascular;  Laterality: N/A;   CESAREAN SECTION  2001   CHOLECYSTECTOMY N/A 11/18/2016   Procedure: LAPAROSCOPIC CHOLECYSTECTOMY WITH INTRAOPERATIVE CHOLANGIOGRAM;  Surgeon: SanChristene LyeD;  [facility];  Service: General;  Laterality: N/A;   COLONOSCOPY WITH PROPOFOL N/A 04/27/2016   Procedure: COLONOSCOPY WITH PROPOFOL;  Surgeon: DarLucilla LameD;  [facility];  Laterality: N/A;   COLONOSCOPY WITH PROPOFOL N/A 01/12/2018   Procedure: COLONOSCOPY WITH PROPOFOL;  Surgeon: Toledo, TeoBenay PikeD;  Location: [facility];  Service: Endoscopy;  Laterality: N/A;   CORONARY ANGIOPLASTY     CORONARY BALLOON ANGIOPLASTY N/A 06/29/2017   Procedure: CORONARY BALLOON ANGIOPLASTY;  Surgeon: AriWellington HampshireD;  [facility];  Service: Cardiovascular;  Laterality: N/A;   CORONARY BALLOON ANGIOPLASTY N/A 09/20/2017   Procedure: CORONARY BALLOON ANGIOPLASTY;  Surgeon: Wellington Hampshire, MD;  Location: [place] CV LAB;  Service: Cardiovascular;  Laterality: N/A;   DILATION AND CURETTAGE OF UTERUS     ESOPHAGOGASTRODUODENOSCOPY (EGD) WITH PROPOFOL N/A 09/14/2014   Procedure: ESOPHAGOGASTRODUODENOSCOPY (EGD) WITH PROPOFOL;  Surgeon: Josefine Class, MD;  Location: [facility] ENDOSCOPY;  Service: Endoscopy;  Laterality: N/A;   ESOPHAGOGASTRODUODENOSCOPY (EGD) WITH PROPOFOL N/A 04/27/2016   Procedure: ESOPHAGOGASTRODUODENOSCOPY (EGD) WITH PROPOFOL;  Surgeon: [name], MD;  Location: [place];  Service: Endoscopy;  Laterality: N/A;  Diabetic - oral meds   ESOPHAGOGASTRODUODENOSCOPY (EGD) WITH PROPOFOL N/A 01/12/2018   Procedure: ESOPHAGOGASTRODUODENOSCOPY  (EGD) WITH PROPOFOL;  Surgeon: Toledo, Benay Pike, MD;  Location: [facility];  Service: Endoscopy;  Laterality: N/A;   GASTRIC BYPASS  09/2009   [facility]    Left Carotid to sublcavian artery bypass w/ subclavian artery ligation     a. Performed @ [facility].   LEFT HEART CATH AND CORONARY ANGIOGRAPHY Left 06/29/2017   Procedure: LEFT HEART CATH AND CORONARY ANGIOGRAPHY;  Surgeon: Wellington Hampshire, MD;  Location: [place] CV LAB;  Service: Cardiovascular;  Laterality: Left;   LEFT HEART CATH AND CORONARY ANGIOGRAPHY N/A 09/20/2017   Procedure: LEFT HEART CATH AND CORONARY ANGIOGRAPHY;  Surgeon: Wellington Hampshire, MD;  Location: [place] CV LAB;  Service: Cardiovascular;  Laterality: N/A;   LEFT HEART CATH AND CORONARY ANGIOGRAPHY N/A 12/20/2017   Procedure: LEFT HEART CATH AND CORONARY ANGIOGRAPHY;  Surgeon: Wellington Hampshire, MD;  Location: [place] CV LAB;  Service: Cardiovascular;  Laterality: N/A;   MELANOMA EXCISION  2016   Dr. Henreitta Cea ABLATION  2002   RIGHT OOPHORECTOMY     SHOULDER ARTHROSCOPY WITH OPEN ROTATOR CUFF REPAIR Right 01/07/2016   Procedure: SHOULDER ARTHROSCOPY WITH DEBRIDMENT, SUBACHROMIAL DECOMPRESSION;  Surgeon: Corky Mull, MD;  Location: [facility];  Service: Orthopedics;  Laterality: Right;   SHOULDER ARTHROSCOPY WITH OPEN ROTATOR CUFF REPAIR Right 03/16/2017   Procedure: SHOULDER ARTHROSCOPY WITH OPEN ROTATOR CUFF REPAIR POSSIBLE BICEPS TENODESIS;  Surgeon: Corky Mull, MD;  Location: [facility];  Service: Orthopedics;  Laterality: Right;   TRIGGER FINGER RELEASE Right     [place]     No outpatient medications have been marked as taking for the 05/05/18 encounter (Appointment) with Minna Merritts, MD.   Current Facility-Administered Medications for the 05/05/18 encounter (Appointment) with Minna Merritts, MD  Medication   promethazine (PHENERGAN) injection 50 mg     Allergies:   Lipitor [atorvastatin]  and Tramadol   Social History   Tobacco Use   Smoking status: Former Smoker    Types: Cigarettes    Last attempt to quit: 08/31/1994    Years since quitting: 23.6   Smokeless tobacco: Never Used   Tobacco comment: quit 28 years ago  Substance Use Topics   Alcohol use: No    Alcohol/week: 0.0 standard drinks   Drug use: No     Current Outpatient Medications on File Prior to Visit  Medication Sig Dispense Refill   ALPRAZolam (XANAX) 1 MG tablet Take 1 tablet (1 mg total) by mouth at bedtime. (Patient taking differently: Take 1 mg by mouth at bedtime. Pt taking 2.5 mg daily.) 14 tablet 0   amLODipine (NORVASC) 2.5 MG tablet Take 2.5 mg by mouth daily.     Blood Glucose  Monitoring Suppl (ONE TOUCH ULTRA 2) w/Device KIT Use to check blood sugar once a day. Dx. E11.9 1 each 0   buPROPion (WELLBUTRIN XL) 150 MG 24 hr tablet TAKE 1 TABLET BY MOUTH DAILY 30 tablet 11   citalopram (CELEXA) 40 MG tablet Take 1 tablet (40 mg total) by mouth daily. 30 tablet 5   clopidogrel (PLAVIX) 75 MG tablet Take 75 mg by mouth daily.     diphenoxylate-atropine (LOMOTIL) 2.5-0.025 MG tablet Take 1 tablet by mouth 4 (four) times daily as needed for diarrhea or loose stools. 120 tablet 0   gabapentin (NEURONTIN) 300 MG capsule TAKE 1 CAPSULE BY MOUTH TWICE A DAY (Patient taking differently: Take 300 mg by mouth 2 (two) times daily. ) 60 capsule 5   isosorbide mononitrate (IMDUR) 120 MG 24 hr tablet Take 1 tablet (120 mg total) by mouth daily. 90 tablet 1   levothyroxine (SYNTHROID, LEVOTHROID) 25 MCG tablet Take 1 tablet (25 mcg total) by mouth daily before breakfast. 30 tablet 12   metFORMIN (GLUCOPHAGE) 500 MG tablet Take 500 mg by mouth 1 day or 1 dose.     metoprolol succinate (TOPROL-XL) 100 MG 24 hr tablet Take 1/2 tablet daily 3 tablet 0   nitroGLYCERIN (NITROSTAT) 0.4 MG SL tablet Place 1 tablet (0.4 mg total) under the tongue every 5 (five) minutes as needed for chest pain. 25 tablet 2     ondansetron (ZOFRAN) 4 MG tablet Take 1 tablet (4 mg total) by mouth every 8 (eight) hours as needed. 20 tablet 0   pantoprazole (PROTONIX) 40 MG tablet Take 1 tablet (40 mg total) by mouth 2 (two) times daily. 60 tablet 5   rosuvastatin (CRESTOR) 40 MG tablet Take 1 tablet (40 mg total) by mouth daily. 90 tablet 3   tiZANidine (ZANAFLEX) 4 MG tablet Take 4 mg by mouth daily as needed for pain.     Current Facility-Administered Medications on File Prior to Visit  Medication Dose Route Frequency Provider Last Rate Last Dose   promethazine (PHENERGAN) injection 50 mg  50 mg Intramuscular Once Burnette, Jennifer M, PA-C         Family Hx: The patient's family history includes Anxiety disorder in her father and mother; Bipolar disorder in her mother; Breast cancer in an other family member; Breast cancer (age of onset: 54) in her maternal aunt and maternal aunt; Colon cancer in her cousin and father; Depression in her father, mother, and sister; Diabetes in her father and sister; Heart disease in her mother; Heart disease (age of onset: 25) in her father; Hyperlipidemia in her mother and sister; Hypertension in her father, mother, sister, and sister; Kidney disease in her father and sister; Ovarian cancer in her cousin; Skin cancer in her father; Stroke in her father; Thyroid nodules in her sister. There is no history of Kidney cancer or Bladder Cancer.  ROS:   Please see the history of present illness.    Review of Systems  Constitutional: Negative.   Respiratory: Negative.   Cardiovascular: Negative.   Gastrointestinal: Negative.   Musculoskeletal: Negative.   Neurological: Positive for dizziness.  Psychiatric/Behavioral: Negative.   All other systems reviewed and are negative.     Labs/Other Tests and Data Reviewed:    Recent Labs: 06/18/2017: BNP 317.4 07/09/2017: Magnesium 1.6 04/28/2018: ALT 43; BUN 9; Creatinine, Ser 1.35; Hemoglobin 9.7; Platelets 189; Potassium 4.0; Sodium  136   Recent Lipid Panel Lab Results  Component Value Date/Time   CHOL 161 02/18/2018  03:13 AM   CHOL 159 05/28/2016 12:12 PM   CHOL 197 12/30/2013 03:55 AM   TRIG 103 02/18/2018 03:13 AM   TRIG 261 (H) 12/30/2013 03:55 AM   HDL 31 (L) 02/18/2018 03:13 AM   HDL 34 (L) 05/28/2016 12:12 PM   HDL 28 (L) 12/30/2013 03:55 AM   CHOLHDL 5.2 02/18/2018 03:13 AM   LDLCALC 109 (H) 02/18/2018 03:13 AM   LDLCALC 103 (H) 12/08/2016 12:21 PM   LDLCALC 117 (H) 12/30/2013 03:55 AM   LDLDIRECT 83.3 01/26/2011 09:13 AM    Wt Readings from Last 3 Encounters:  04/29/18 166 lb (75.3 kg)  04/28/18 165 lb (74.8 kg)  04/27/18 165 lb (74.8 kg)     Exam:    Vital Signs: Vital signs as detailed above in HPI  Well nourished, well developed female in no acute distress. Constitutional:  oriented to person, place, and time. No distress.  Head: Normocephalic and atraumatic.  Eyes:  no discharge. No scleral icterus.  Neck: Normal range of motion. Neck supple.  Pulmonary/Chest: No audible wheezing, no distress, appears comfortable Musculoskeletal: Normal range of motion.  no  tenderness or deformity.  Neurological:   Coordination normal. Full exam not performed Skin:  No rash Psychiatric:  normal mood and affect. behavior is normal. Thought content normal.    ASSESSMENT & PLAN:     Atherosclerosis of native coronary artery of native heart with stable angina pectoris (HCC) Currently with no symptoms of angina. No further workup at this time. Continue current medication regimen.  Near syncope Etiology unclear,  Possibly from bradycardia or other arrhythmia Recommend she cut back on her metoprolol down to 25 daily With previously increase metoprolol up to 50 for her tremors which she said was helpful She will call us if near syncope episode persists and monitor could be ordered  chronic systolic CHF (congestive heart failure) ([place]) Feels she is euvolemic, no changes to her medications  CKD  (chronic kidney disease), stage III (HCC) Dramatic improvement in her renal function creatinine at 1.3  Essential hypertension Blood pressure markedly elevated Recommend she take amlodipine 5 in the morning, suggest extra 5 in the evening for systolic pressure over 884 Blood pressure was 180 on the web call with me today  History of CVA (cerebrovascular accident) Stressed importance of aggressive blood pressure control  Hypertensive heart disease without heart failure Blood pressure medication changes as detailed above  Ischemic cardiomyopathy Stressed the importance of aggressive diabetes control, taking her statin   COVID-19 Education: The signs and symptoms of COVID-19 were discussed with the patient and how to seek care for testing (follow up with PCP or arrange E-visit).  The importance of social distancing was discussed today.  Patient Risk:   After full review of this patients clinical status, I feel that they are at least moderate risk at this time.  Time:   Today, I have spent 25 minutes with the patient with telehealth technology discussing .     Medication Adjustments/Labs and Tests Ordered: Current medicines are reviewed at length with the patient today.  Concerns regarding medicines are outlined above.   Tests Ordered: No tests ordered   Medication Changes: We will change the amlodipine up to 5 mg twice a day as needed Take extra amlodipine in the afternoon for systolic pressure more than 150  We will decrease the metoprolol succinate down to 25 mg daily   Disposition: Follow-up in 6 months   Signed, Ida Rogue, MD  05/05/2018 4:08 PM  [place] [facility] [address], [place], [place] [address]

## 2018-05-05 NOTE — Patient Instructions (Signed)
Please make the medication changes as detailed below Call our office if you continue to have near syncope episodes   Medication Instructions:  We will change the amlodipine up to 5 mg twice a day as needed Take extra amlodipine in the afternoon for systolic pressure more than 150  We will decrease the metoprolol succinate down to 25 mg daily  If you need a refill on your cardiac medications before your next appointment, please call your pharmacy.    Lab work: No new labs needed   If you have labs (blood work) drawn today and your tests are completely normal, you will receive your results only by: Marland Kitchen MyChart Message (if you have MyChart) OR . A paper copy in the mail If you have any lab test that is abnormal or we need to change your treatment, we will call you to review the results.   Testing/Procedures: No new testing needed   Follow-Up: At [facility], you and your health needs are our priority.  As part of our continuing mission to provide you with exceptional heart care, we have created designated Provider Care Teams.  These Care Teams include your primary Cardiologist (physician) and Advanced Practice Providers (APPs -  Physician Assistants and Nurse Practitioners) who all work together to provide you with the care you need, when you need it.  . You will need a follow up appointment in 3 months .   Please call our office 2 months in advance to schedule this appointment.    . Providers on your designated Care Team:   . Murray Hodgkins, NP . Christell Faith, PA-C . Marrianne Mood, PA-C  Any Other Special Instructions Will Be Listed Below (If Applicable).  For educational health videos Log in to : [email or web address] Or : [email or web address], password : [email or web address]

## 2018-05-06 ENCOUNTER — Ambulatory Visit: Payer: Medicare Other

## 2018-05-10 DIAGNOSIS — E1142 Type 2 diabetes mellitus with diabetic polyneuropathy: Secondary | ICD-10-CM | POA: Diagnosis not present

## 2018-05-10 DIAGNOSIS — N183 Chronic kidney disease, stage 3 (moderate): Secondary | ICD-10-CM | POA: Diagnosis not present

## 2018-05-10 DIAGNOSIS — Z9884 Bariatric surgery status: Secondary | ICD-10-CM | POA: Diagnosis not present

## 2018-05-10 DIAGNOSIS — E669 Obesity, unspecified: Secondary | ICD-10-CM | POA: Diagnosis not present

## 2018-05-10 DIAGNOSIS — E039 Hypothyroidism, unspecified: Secondary | ICD-10-CM | POA: Diagnosis not present

## 2018-05-10 DIAGNOSIS — E782 Mixed hyperlipidemia: Secondary | ICD-10-CM | POA: Diagnosis not present

## 2018-05-10 DIAGNOSIS — E1122 Type 2 diabetes mellitus with diabetic chronic kidney disease: Secondary | ICD-10-CM | POA: Diagnosis not present

## 2018-05-10 DIAGNOSIS — I129 Hypertensive chronic kidney disease with stage 1 through stage 4 chronic kidney disease, or unspecified chronic kidney disease: Secondary | ICD-10-CM | POA: Diagnosis not present

## 2018-05-25 ENCOUNTER — Telehealth: Payer: Self-pay

## 2018-05-25 DIAGNOSIS — R197 Diarrhea, unspecified: Secondary | ICD-10-CM

## 2018-05-25 DIAGNOSIS — R1013 Epigastric pain: Secondary | ICD-10-CM

## 2018-05-25 NOTE — Telephone Encounter (Signed)
Patient called saying that she was to call if her symptoms did not improve. She reports that she was seen in the office about 3-4 weeks ago with stomach issues, and that if she did not feel better to call and let us set up an abdominal ultrasound. Patient is wanting to proceed with this. Please advise. Thanks!

## 2018-05-26 NOTE — Telephone Encounter (Signed)
Patient states she is still having the pain nausea and the diarrhea. Patient is agreeable to doing the stool studies.

## 2018-05-26 NOTE — Telephone Encounter (Signed)
Stool cultures ordered

## 2018-05-26 NOTE — Telephone Encounter (Signed)
Is she still having pain, nausea, diarrhea? It may be worthwhile to get stool cultures if she is still having the diarrhea.

## 2018-05-27 ENCOUNTER — Telehealth: Payer: Self-pay

## 2018-05-27 DIAGNOSIS — F439 Reaction to severe stress, unspecified: Secondary | ICD-10-CM

## 2018-05-27 DIAGNOSIS — R197 Diarrhea, unspecified: Secondary | ICD-10-CM | POA: Diagnosis not present

## 2018-05-27 DIAGNOSIS — R1013 Epigastric pain: Secondary | ICD-10-CM | POA: Diagnosis not present

## 2018-05-27 DIAGNOSIS — F5102 Adjustment insomnia: Secondary | ICD-10-CM

## 2018-05-27 MED ORDER — ALPRAZOLAM 0.5 MG PO TABS: 0.2500 mg | ORAL_TABLET | Freq: Two times a day (BID) | ORAL | 5 refills | Status: DC | PRN

## 2018-05-27 NOTE — Telephone Encounter (Signed)
I have only seen her for an acute GI issue

## 2018-05-27 NOTE — Telephone Encounter (Signed)
Patient states that she is taking 2 Xanax a day instead what is prescribed for about a month. She is requesting a new prescription.

## 2018-05-31 LAB — CDIFF NAA+O+P+STOOL CULTURE
E coli, Shiga toxin Assay: NEGATIVE
Toxigenic C. Difficile by PCR: NEGATIVE

## 2018-06-01 ENCOUNTER — Telehealth: Payer: Self-pay

## 2018-06-01 DIAGNOSIS — K580 Irritable bowel syndrome with diarrhea: Secondary | ICD-10-CM

## 2018-06-01 DIAGNOSIS — I639 Cerebral infarction, unspecified: Secondary | ICD-10-CM | POA: Diagnosis not present

## 2018-06-01 MED ORDER — RIFAXIMIN 200 MG PO TABS: 200.0000 mg | ORAL_TABLET | Freq: Three times a day (TID) | ORAL | 0 refills | Status: DC

## 2018-06-01 NOTE — Telephone Encounter (Signed)
Pt advised.  She would like to try the medication first.  Please send to [facility].   Thanks,   -Mickel Baas

## 2018-06-01 NOTE — Telephone Encounter (Signed)
LMTCB 06/01/2018   Thanks,   -Mickel Baas

## 2018-06-01 NOTE — Telephone Encounter (Signed)
-----   Message from Mar Daring, PA-C sent at 06/01/2018  8:35 AM EDT ----- Stool cultures are all negative. We can either 1) try medication for IBS D or 2) get a CT scan. Option 1 is of course the cheaper route, but if you are worried something is being missed we can get the CT scan. With reassuring labs and stool cultures I am leaning toward this being more a flare of irritable bowel due to the current situation.

## 2018-06-01 NOTE — Telephone Encounter (Signed)
Sent in [place]. This is a medication she only has to take x 14 days to see if it will help the symptoms. If no improvement let me know and we can try one of the medications she has to take daily.

## 2018-06-03 NOTE — Telephone Encounter (Signed)
Spoke to pt and advised on medication to try and was sent to pharmacy

## 2018-06-08 ENCOUNTER — Other Ambulatory Visit: Payer: Self-pay | Admitting: Family Medicine

## 2018-06-09 ENCOUNTER — Other Ambulatory Visit: Payer: Self-pay | Admitting: Physician Assistant

## 2018-06-11 ENCOUNTER — Other Ambulatory Visit: Payer: Self-pay | Admitting: Family Medicine

## 2018-06-11 DIAGNOSIS — F5102 Adjustment insomnia: Secondary | ICD-10-CM

## 2018-06-11 DIAGNOSIS — F439 Reaction to severe stress, unspecified: Secondary | ICD-10-CM

## 2018-06-14 ENCOUNTER — Inpatient Hospital Stay
Admission: EM | Admit: 2018-06-14 | Discharge: 2018-06-16 | DRG: 871 | Disposition: A | Discharge status: Home Health | Payer: Medicare Other | Attending: Internal Medicine | Admitting: Internal Medicine

## 2018-06-14 ENCOUNTER — Encounter: Payer: Self-pay | Admitting: Emergency Medicine

## 2018-06-14 ENCOUNTER — Emergency Department: Payer: Medicare Other

## 2018-06-14 ENCOUNTER — Other Ambulatory Visit: Payer: Self-pay

## 2018-06-14 DIAGNOSIS — R42 Dizziness and giddiness: Secondary | ICD-10-CM | POA: Diagnosis not present

## 2018-06-14 DIAGNOSIS — E785 Hyperlipidemia, unspecified: Secondary | ICD-10-CM | POA: Diagnosis present

## 2018-06-14 DIAGNOSIS — I6622 Occlusion and stenosis of left posterior cerebral artery: Secondary | ICD-10-CM | POA: Diagnosis not present

## 2018-06-14 DIAGNOSIS — G9341 Metabolic encephalopathy: Secondary | ICD-10-CM | POA: Diagnosis present

## 2018-06-14 DIAGNOSIS — Z9884 Bariatric surgery status: Secondary | ICD-10-CM

## 2018-06-14 DIAGNOSIS — Z955 Presence of coronary angioplasty implant and graft: Secondary | ICD-10-CM

## 2018-06-14 DIAGNOSIS — Z87440 Personal history of urinary (tract) infections: Secondary | ICD-10-CM

## 2018-06-14 DIAGNOSIS — R4781 Slurred speech: Secondary | ICD-10-CM

## 2018-06-14 DIAGNOSIS — Z888 Allergy status to other drugs, medicaments and biological substances status: Secondary | ICD-10-CM

## 2018-06-14 DIAGNOSIS — Z03818 Encounter for observation for suspected exposure to other biological agents ruled out: Secondary | ICD-10-CM | POA: Diagnosis not present

## 2018-06-14 DIAGNOSIS — Z808 Family history of malignant neoplasm of other organs or systems: Secondary | ICD-10-CM

## 2018-06-14 DIAGNOSIS — G9340 Encephalopathy, unspecified: Secondary | ICD-10-CM | POA: Diagnosis not present

## 2018-06-14 DIAGNOSIS — N179 Acute kidney failure, unspecified: Secondary | ICD-10-CM | POA: Diagnosis present

## 2018-06-14 DIAGNOSIS — F319 Bipolar disorder, unspecified: Secondary | ICD-10-CM | POA: Diagnosis present

## 2018-06-14 DIAGNOSIS — I2510 Atherosclerotic heart disease of native coronary artery without angina pectoris: Secondary | ICD-10-CM | POA: Diagnosis present

## 2018-06-14 DIAGNOSIS — Z818 Family history of other mental and behavioral disorders: Secondary | ICD-10-CM

## 2018-06-14 DIAGNOSIS — Z823 Family history of stroke: Secondary | ICD-10-CM

## 2018-06-14 DIAGNOSIS — A419 Sepsis, unspecified organism: Secondary | ICD-10-CM | POA: Diagnosis not present

## 2018-06-14 DIAGNOSIS — I130 Hypertensive heart and chronic kidney disease with heart failure and stage 1 through stage 4 chronic kidney disease, or unspecified chronic kidney disease: Secondary | ICD-10-CM | POA: Diagnosis present

## 2018-06-14 DIAGNOSIS — Z8673 Personal history of transient ischemic attack (TIA), and cerebral infarction without residual deficits: Secondary | ICD-10-CM | POA: Diagnosis not present

## 2018-06-14 DIAGNOSIS — I5022 Chronic systolic (congestive) heart failure: Secondary | ICD-10-CM | POA: Diagnosis present

## 2018-06-14 DIAGNOSIS — E860 Dehydration: Secondary | ICD-10-CM | POA: Diagnosis present

## 2018-06-14 DIAGNOSIS — Z87891 Personal history of nicotine dependence: Secondary | ICD-10-CM | POA: Diagnosis not present

## 2018-06-14 DIAGNOSIS — N183 Chronic kidney disease, stage 3 (moderate): Secondary | ICD-10-CM | POA: Diagnosis present

## 2018-06-14 DIAGNOSIS — I6523 Occlusion and stenosis of bilateral carotid arteries: Secondary | ICD-10-CM | POA: Diagnosis not present

## 2018-06-14 DIAGNOSIS — N390 Urinary tract infection, site not specified: Secondary | ICD-10-CM | POA: Diagnosis not present

## 2018-06-14 DIAGNOSIS — Z1159 Encounter for screening for other viral diseases: Secondary | ICD-10-CM

## 2018-06-14 DIAGNOSIS — I252 Old myocardial infarction: Secondary | ICD-10-CM | POA: Diagnosis not present

## 2018-06-14 DIAGNOSIS — I959 Hypotension, unspecified: Secondary | ICD-10-CM | POA: Diagnosis not present

## 2018-06-14 DIAGNOSIS — N186 End stage renal disease: Secondary | ICD-10-CM | POA: Diagnosis not present

## 2018-06-14 DIAGNOSIS — Z841 Family history of disorders of kidney and ureter: Secondary | ICD-10-CM

## 2018-06-14 DIAGNOSIS — Z8711 Personal history of peptic ulcer disease: Secondary | ICD-10-CM

## 2018-06-14 DIAGNOSIS — E039 Hypothyroidism, unspecified: Secondary | ICD-10-CM | POA: Diagnosis present

## 2018-06-14 DIAGNOSIS — Z8249 Family history of ischemic heart disease and other diseases of the circulatory system: Secondary | ICD-10-CM

## 2018-06-14 DIAGNOSIS — Z833 Family history of diabetes mellitus: Secondary | ICD-10-CM

## 2018-06-14 DIAGNOSIS — I639 Cerebral infarction, unspecified: Secondary | ICD-10-CM | POA: Diagnosis not present

## 2018-06-14 DIAGNOSIS — R51 Headache: Secondary | ICD-10-CM | POA: Diagnosis not present

## 2018-06-14 DIAGNOSIS — Z7902 Long term (current) use of antithrombotics/antiplatelets: Secondary | ICD-10-CM

## 2018-06-14 DIAGNOSIS — E1122 Type 2 diabetes mellitus with diabetic chronic kidney disease: Secondary | ICD-10-CM | POA: Diagnosis present

## 2018-06-14 DIAGNOSIS — Z90721 Acquired absence of ovaries, unilateral: Secondary | ICD-10-CM | POA: Diagnosis not present

## 2018-06-14 DIAGNOSIS — Z885 Allergy status to narcotic agent status: Secondary | ICD-10-CM

## 2018-06-14 DIAGNOSIS — Z86010 Personal history of colonic polyps: Secondary | ICD-10-CM | POA: Diagnosis not present

## 2018-06-14 DIAGNOSIS — Z79890 Hormone replacement therapy (postmenopausal): Secondary | ICD-10-CM

## 2018-06-14 DIAGNOSIS — Z85820 Personal history of malignant melanoma of skin: Secondary | ICD-10-CM

## 2018-06-14 DIAGNOSIS — Z79899 Other long term (current) drug therapy: Secondary | ICD-10-CM

## 2018-06-14 DIAGNOSIS — N3000 Acute cystitis without hematuria: Secondary | ICD-10-CM | POA: Diagnosis present

## 2018-06-14 DIAGNOSIS — Z7984 Long term (current) use of oral hypoglycemic drugs: Secondary | ICD-10-CM

## 2018-06-14 DIAGNOSIS — Z8349 Family history of other endocrine, nutritional and metabolic diseases: Secondary | ICD-10-CM

## 2018-06-14 LAB — CBC
HCT: 27.7 % — ABNORMAL LOW (ref 36.0–46.0)
Hemoglobin: 8.3 g/dL — ABNORMAL LOW (ref 12.0–15.0)
MCH: 19.9 pg — ABNORMAL LOW (ref 26.0–34.0)
MCHC: 30 g/dL (ref 30.0–36.0)
MCV: 66.4 fL — ABNORMAL LOW (ref 80.0–100.0)
Platelets: 227 10*3/uL (ref 150–400)
RBC: 4.17 MIL/uL (ref 3.87–5.11)
RDW: 15.6 % — ABNORMAL HIGH (ref 11.5–15.5)
WBC: 9.7 10*3/uL (ref 4.0–10.5)
nRBC: 0 % (ref 0.0–0.2)

## 2018-06-14 LAB — URINALYSIS, COMPLETE (UACMP) WITH MICROSCOPIC
Bacteria, UA: NONE SEEN
Bilirubin Urine: NEGATIVE
Glucose, UA: NEGATIVE mg/dL
Ketones, ur: NEGATIVE mg/dL
Nitrite: POSITIVE — AB
Protein, ur: NEGATIVE mg/dL
Specific Gravity, Urine: 1.005 (ref 1.005–1.030)
pH: 6 (ref 5.0–8.0)

## 2018-06-14 LAB — HEPATIC FUNCTION PANEL
ALT: 46 U/L — ABNORMAL HIGH (ref 0–44)
AST: 41 U/L (ref 15–41)
Albumin: 3.7 g/dL (ref 3.5–5.0)
Alkaline Phosphatase: 115 U/L (ref 38–126)
Bilirubin, Direct: <0.1 mg/dL (ref 0.0–0.2)
Total Bilirubin: 0.4 mg/dL (ref 0.3–1.2)
Total Protein: 6.9 g/dL (ref 6.5–8.1)

## 2018-06-14 LAB — GLUCOSE, CAPILLARY: Glucose-Capillary: 92 mg/dL (ref 70–99)

## 2018-06-14 LAB — BASIC METABOLIC PANEL
Anion gap: 11 (ref 5–15)
BUN: 21 mg/dL — ABNORMAL HIGH (ref 6–20)
CO2: 22 mmol/L (ref 22–32)
Calcium: 8.4 mg/dL — ABNORMAL LOW (ref 8.9–10.3)
Chloride: 98 mmol/L (ref 98–111)
Creatinine, Ser: 2.16 mg/dL — ABNORMAL HIGH (ref 0.44–1.00)
GFR calc Af Amer: 30 mL/min — ABNORMAL LOW (ref >60)
GFR calc non Af Amer: 26 mL/min — ABNORMAL LOW (ref >60)
Glucose, Bld: 147 mg/dL — ABNORMAL HIGH (ref 70–99)
Potassium: 3.9 mmol/L (ref 3.5–5.1)
Sodium: 131 mmol/L — ABNORMAL LOW (ref 135–145)

## 2018-06-14 LAB — TROPONIN I: Troponin I: <0.03 ng/mL (ref <0.03)

## 2018-06-14 LAB — PREGNANCY, URINE: Preg Test, Ur: NEGATIVE

## 2018-06-14 LAB — SARS CORONAVIRUS 2 BY RT PCR (HOSPITAL ORDER, PERFORMED IN ~~LOC~~ HOSPITAL LAB): SARS Coronavirus 2: NEGATIVE

## 2018-06-14 MED ORDER — SODIUM CHLORIDE 0.9 % IV SOLN
1.0000 g | INTRAVENOUS | Status: DC
  Administered 2018-06-14: 22:00:00 1 g via INTRAVENOUS
  Filled 2018-06-14 (×2): qty 10

## 2018-06-14 MED ORDER — SODIUM CHLORIDE 0.9 % IV SOLN
1.0000 g | INTRAVENOUS | Status: DC
  Administered 2018-06-15: 1 g via INTRAVENOUS
  Filled 2018-06-14: qty 10
  Filled 2018-06-14: qty 1

## 2018-06-14 MED ORDER — SODIUM CHLORIDE 0.9 % IV SOLN: 1.0000 g | INTRAVENOUS | Status: DC

## 2018-06-14 MED ORDER — SODIUM CHLORIDE 0.9 % IV SOLN
Freq: Once | INTRAVENOUS | Status: AC
  Administered 2018-06-14: 21:00:00 via INTRAVENOUS

## 2018-06-14 MED ORDER — POLYETHYLENE GLYCOL 3350 17 G PO PACK: 17.0000 g | PACK | Freq: Every day | ORAL | Status: DC | PRN

## 2018-06-14 MED ORDER — ACETAMINOPHEN 325 MG PO TABS: 650.0000 mg | ORAL_TABLET | Freq: Four times a day (QID) | ORAL | Status: DC | PRN

## 2018-06-14 MED ORDER — ONDANSETRON HCL 4 MG/2ML IJ SOLN: 4.0000 mg | Freq: Four times a day (QID) | INTRAMUSCULAR | Status: DC | PRN

## 2018-06-14 MED ORDER — SODIUM CHLORIDE 0.9 % IV BOLUS
1000.0000 mL | Freq: Once | INTRAVENOUS | Status: AC
  Administered 2018-06-14: 20:00:00 1000 mL via INTRAVENOUS

## 2018-06-14 MED ORDER — SODIUM CHLORIDE 0.9 % IV SOLN
INTRAVENOUS | Status: DC
  Administered 2018-06-15 (×2): via INTRAVENOUS

## 2018-06-14 MED ORDER — ADULT MULTIVITAMIN W/MINERALS CH
1.0000 | ORAL_TABLET | Freq: Every day | ORAL | Status: DC
  Administered 2018-06-15 – 2018-06-16 (×2): 1 via ORAL
  Filled 2018-06-14 (×2): qty 1

## 2018-06-14 MED ORDER — ENOXAPARIN SODIUM 40 MG/0.4ML ~~LOC~~ SOLN
40.0000 mg | SUBCUTANEOUS | Status: DC
  Administered 2018-06-15 – 2018-06-16 (×2): 40 mg via SUBCUTANEOUS
  Filled 2018-06-14 (×2): qty 0.4

## 2018-06-14 MED ORDER — STROKE: EARLY STAGES OF RECOVERY BOOK
Freq: Once | Status: AC
  Administered 2018-06-15: 02:00:00

## 2018-06-14 MED ORDER — ONDANSETRON HCL 4 MG PO TABS: 4.0000 mg | ORAL_TABLET | Freq: Four times a day (QID) | ORAL | Status: DC | PRN

## 2018-06-14 MED ORDER — ACETAMINOPHEN 650 MG RE SUPP: 650.0000 mg | Freq: Four times a day (QID) | RECTAL | Status: DC | PRN

## 2018-06-14 NOTE — ED Notes (Signed)
Pt c/o dizziness at this time. Pt denies pain at this time

## 2018-06-14 NOTE — ED Provider Notes (Signed)
[facility] Emergency Department Provider Note    First MD Initiated Contact with Patient 06/14/18 1947     (approximate)  I have reviewed the triage vital signs and the nursing notes.   HISTORY  Chief Complaint Dizziness    HPI Alt[place] [name] is a 48 y.o. female close past medical history presents the ER for slurred speech generalized weakness and fatigue.  States she has a history of stroke.  States that she was having slurred speech noticed by her son this morning and told her to come to the ER.  Last known well was yesterday.  She is already on Plavix.  Denies any abdominal pain.  Still feels fatigued.  No nausea or vomiting or diarrhea.  No chest pain or shortness of breath.  Denies taking any accidental ingestion of her blood pressure medication but arrives the ER drowsy weak appearing with hypotension.    Past Medical History:  Diagnosis Date  . Acute pyelonephritis   . Anemia    iron deficiency anemia  . Aortic arch aneurysm ([place])   . Bipolar disorder ([place])   . BRCA negative 2014  . CAD (coronary artery disease)    a. 08/2003 Cath: LAD 30-40-med Rx; b. 11/2014 PCI: LAD 49m(3.25x23 Xience Alpine DES); c. 06/2015 PCI: D1 (2.25x12 Resolute Integrity DES); d. 06/2017 PCI: Patent mLAD stent, D2 95 (PTCA); e. 09/2017 PCI: D2 99ost (CBA); d. 12/2017 Cath: LM nl, LAD 351m80d (small), D1 40ost, D2 95ost, LCX 40p, RCA 40ost/p->Med rx for D2 given restenosis.  . CKD (chronic kidney disease), stage III (HCVan Buren  . Colon polyp   . CVA (cerebral vascular accident) (HCCherryv[place]   Left side weakness.   . Diabetes (HCArmada  . Family history of breast cancer    BRCA neg 2014  . Gastric ulcer 04/27/2011  . History of echocardiogram    a. 03/2017 Echo: EF 60-65%, no rwma; b. 02/2018 Echo: EF 60-65%, no rwma. Nl RV fxn. No cardiac source of emboli (admitted w/ stroke).  . [place] (hypertension)   . Hyperlipemia   . Hypothyroid   . Malignant melanoma of skin of scalp (HCOil T[place]   . MI, acute, non ST segment elevation (HCStanwood  . Neuromuscular disorder (HCDover  . Orthostatic hypotension   . S/P drug eluting coronary stent placement 06/04/2015  . Sepsis (HCEssex [place]2/14/2019  . Stroke ([facility]   a. 02/2018 MRI: 1m70mate acute/early subacute L medial frontal lobe inarct; b. 02/2018 MRA No large vessel occlusion or aneurysm. Mod to sev L P2 stenosis. thready L vertebral artery, diffusely dzs'd; c. 02/2018 Carotid U/S: <50% bilat ICA dzs.   Family History  Problem Relation Age of Onset  . Hypertension Mother   . Anxiety disorder Mother   . Depression Mother   . Bipolar disorder Mother   . Heart disease Mother        No details  . Hyperlipidemia Mother   . Kidney disease Father   . Heart disease Father 40 52 Hypertension Father   . Diabetes Father   . Stroke Father   . Colon cancer Father        dx in his 40'82's Anxiety disorder Father   . Depression Father   . Skin cancer Father   . Kidney disease Sister   . Thyroid nodules Sister   . Hypertension Sister   . Hypertension Sister   . Diabetes Sister   . Hyperlipidemia  Sister   . Depression Sister   . Breast cancer Maternal Aunt 29  . Breast cancer Maternal Aunt 25  . Ovarian cancer Cousin   . Colon cancer Cousin   . Breast cancer Other   . Kidney cancer Neg Hx   . Bladder Cancer Neg Hx    Past Surgical History:  Procedure Laterality Date  . APPENDECTOMY    . CARDIAC CATHETERIZATION N/A 11/09/2014   Procedure: Coronary Angiography;  Surgeon: Minna Merritts, MD;  Location: [place] [facility];  Service: Cardiovascular;  Laterality: N/A;  . CARDIAC CATHETERIZATION N/A 11/12/2014   Procedure: Coronary Stent Intervention;  Surgeon: Isaias Cowman, MD;  Location: [place] CV LAB;  Service: Cardiovascular;  Laterality: N/A;  . CARDIAC CATHETERIZATION N/A 04/18/2015   Procedure: Left Heart Cath and Coronary Angiography;  Surgeon: Minna Merritts, MD;  Location: [place] CV LAB;  Service:  Cardiovascular;  Laterality: N/A;  . CARDIAC CATHETERIZATION Left 06/04/2015   Procedure: Left Heart Cath and Coronary Angiography;  Surgeon: Wellington Hampshire, MD;  Location: [place] CV LAB;  Service: Cardiovascular;  Laterality: Left;  . CARDIAC CATHETERIZATION N/A 06/04/2015   Procedure: Coronary Stent Intervention;  Surgeon: Wellington Hampshire, MD;  Location: [name] CV LAB;  Service: Cardiovascular;  Laterality: N/A;  . CESAREAN SECTION  2001  . CHOLECYSTECTOMY N/A 11/18/2016   Procedure: LAPAROSCOPIC CHOLECYSTECTOMY WITH INTRAOPERATIVE CHOLANGIOGRAM;  Surgeon: Christene Lye, MD;  Location: [facility] ORS;  Service: General;  Laterality: N/A;  . COLONOSCOPY WITH PROPOFOL N/A 04/27/2016   Procedure: COLONOSCOPY WITH PROPOFOL;  Surgeon: [name], MD;  Location: [place];  Service: Endoscopy;  Laterality: N/A;  . COLONOSCOPY WITH PROPOFOL N/A 01/12/2018   Procedure: COLONOSCOPY WITH PROPOFOL;  Surgeon: Tol[place], Benay Pike, MD;  Location: [facility] ENDOSCOPY;  Service: Endoscopy;  Laterality: N/A;  . CORONARY ANGIOPLASTY    . CORONARY BALLOON ANGIOPLASTY N/A 06/29/2017   Procedure: CORONARY BALLOON ANGIOPLASTY;  Surgeon: Wellington Hampshire, MD;  Location: [place] CV LAB;  Service: Cardiovascular;  Laterality: N/A;  . CORONARY BALLOON ANGIOPLASTY N/A 09/20/2017   Procedure: CORONARY BALLOON ANGIOPLASTY;  Surgeon: Wellington Hampshire, MD;  Location: [place] CV LAB;  Service: Cardiovascular;  Laterality: N/A;  . DILATION AND CURETTAGE OF UTERUS    . ESOPHAGOGASTRODUODENOSCOPY (EGD) WITH PROPOFOL N/A 09/14/2014   Procedure: ESOPHAGOGASTRODUODENOSCOPY (EGD) WITH PROPOFOL;  Surgeon: Josefine Class, MD;  Location[facility] END[facility]COPY;  Service: Endoscopy;  Laterality: N/A;  . ESOPHAGOGASTRODUODENOSCOPY (EGD) WITH PROPOFOL N/A 04/27/2016   Procedure: ESOPHAGOGASTRODUODENOSCOPY (EGD) WITH PROPOFOL;  Surgeon: [name], MD;  Location: [place];  Service: Endoscopy;   Laterality: N/A;  Diabetic - oral meds  . ESOPHAGOGASTRODUODENOSCOPY (EGD) WITH PROPOFOL N/A 01/12/2018   Procedure: ESOPHAGOGASTRODUODENOSCOPY (EGD) WITH PROPOFOL;  Surgeon: [place], Benay Pike, MD;  Location: [facility] ENDOSCOPY;  Service: Endoscopy;  Laterality: N/A;  . GASTRIC BYPASS  09/2009   [facility]   . Left Carotid to sublcavian artery bypass w/ subclavian artery ligation     a. Performed @ [place].  . LEFT HEART CATH AND CORONARY ANGIOGRAPHY Left 06/29/2017   Procedure: LEFT HEART CATH AND CORONARY ANGIOGRAPHY;  Surgeon: Wellington Hampshire, MD;  Location: [place] CV LAB;  Service: Cardiovascular;  Laterality: Left;  . LEFT HEART CATH AND CORONARY ANGIOGRAPHY N/A 09/20/2017   Procedure: LEFT HEART CATH AND CORONARY ANGIOGRAPHY;  Surgeon: Wellington Hampshire, MD;  Location: [place] CV LAB;  Service: Cardiovascular;  Laterality: N/A;  .  LEFT HEART CATH AND CORONARY ANGIOGRAPHY N/A 12/20/2017   Procedure: LEFT HEART CATH AND CORONARY ANGIOGRAPHY;  Surgeon: Wellington Hampshire, MD;  Location: [place] CV LAB;  Service: Cardiovascular;  Laterality: N/A;  . MELANOMA EXCISION  2016   Dr. Evorn Gong  . [place]  2002  . RIGHT OOPHORECTOMY    . SHOULDER ARTHROSCOPY WITH OPEN ROTATOR CUFF REPAIR Right 01/07/2016   Procedure: SHOULDER ARTHROSCOPY WITH DEBRIDMENT, SUBACHROMIAL DECOMPRESSION;  Surgeon: Corky Mull, MD;  Location: [facility];  Service: Orthopedics;  Laterality: Right;  . SHOULDER ARTHROSCOPY WITH OPEN ROTATOR CUFF REPAIR Right 03/16/2017   Procedure: SHOULDER ARTHROSCOPY WITH OPEN ROTATOR CUFF REPAIR POSSIBLE BICEPS TENODESIS;  Surgeon: Corky Mull, MD;  Location: [facility] ORS;  Service: Orthopedics;  Laterality: Right;  . TRIGGER FINGER RELEASE Right     [place]   Patient Active Problem List   Diagnosis Date Noted  . Autonomic neuropathy 03/24/2018  . Acute delirium 03/03/2018  . H/O gastric bypass 03/02/2018  . MI, acute, non ST segment elevation  ([place])   . Effort angina ([place]) 06/29/2017  . Unstable angina ([place]) 06/24/2017  . Syncope 04/09/2017  . Insomnia 03/18/2017  . Ischemic cardiomyopathy   . Arthritis   . Anxiety   . Acute colitis 01/27/2017  . Hx of colonic polyps   . H/O medication noncompliance 12/14/2015  . Emesis   . Atherosclerosis of native coronary artery of native heart with stable angina pectoris ([place])   . Hypertensive heart disease   . CKD (chronic kidney disease), stage III ([place]) 06/05/2015  . Presence of drug coated stent in LAD coronary artery & now in D1 06/04/2015  . Colitis 06/03/2015  . Carotid stenosis 04/30/2015  . Type 2 diabetes mellitus with stage 3 chronic kidney disease, without long-term current use of insulin ([place])   . Stable angina pectoris ([place]) 04/17/2015  . Iron deficiency anemia 03/22/2015  . Vitamin B12 deficiency 02/18/2015  . Misuse of medications for pain 02/18/2015  . Major depressive disorder, recurrent, severe with psychotic features ([place]) 02/15/2015  . Helicobacter pylori infection 11/23/2014  . Hemiparesis, left ([place]) 11/23/2014  . Benign neoplasm of colon 11/20/2014  . Malignant melanoma ([place]) 08/25/2014  . Chronic systolic CHF (congestive heart failure) ([place])   . Incomplete bladder emptying 07/12/2014  . Adult hypothyroidism 12/30/2013  . Aberrant subclavian artery 11/17/2013  . Multiple sclerosis ([place]) 11/02/2013  . History of CVA (cerebrovascular accident) 06/20/2013  . Headache, migraine 05/29/2013  . Hyperlipidemia   . GERD (gastroesophageal reflux disease)   . Neuropathy ([place]) 01/02/2011  . Stroke ([place]) 06/21/2008  . Essential hypertension 05/01/2008      Prior to Admission medications   Medication Sig Start Date End Date Taking? Authorizing Provider  ALPRAZolam Duanne Moron) 0.5 MG tablet Take 0.5-1 tablets (0.25-0.5 mg total) by mouth 2 (two) times daily as needed for anxiety. 05/27/18   [name], MD  ALPRAZolam Duanne Moron) 1 MG tablet TAKE 1 TABLET BY MOUTH  DAILY AS NEEDED FOR ANXIETY 06/12/18   [name], MD  amLODipine (NORVASC) 5 MG tablet Take 1 tablet (5 mg total) by mouth 2 (two) times daily. 05/05/18 08/03/18  Minna Merritts, MD  Blood Glucose Monitoring Suppl (ONE TOUCH ULTRA 2) w/Device KIT Use to check blood sugar once a day. Dx. E11.9 03/18/17   [name], MD  buPROPion (WELLBUTRIN XL) 150 MG 24 hr tablet TAKE 1 TABLET BY MOUTH DAILY 08/10/17   [name], MD  citalopram (CELEXA) 40 MG tablet Take 1 tablet (40 mg total) by mouth daily. 06/08/17   [name], MD  clopidogrel (PLAVIX) 75 MG tablet Take 75 mg by mouth daily.    [provider]  gabapentin (NEURONTIN) 300 MG capsule Take 1 capsule (300 mg total) by mouth 2 (two) times daily. 06/08/18   [name], MD  isosorbide mononitrate (IMDUR) 120 MG 24 hr tablet Take 1 tablet by mouth once daily 06/09/18   Rise Mu, PA-C  levothyroxine (SYNTHROID, LEVOTHROID) 25 MCG tablet Take 1 tablet (25 mcg total) by mouth daily before breakfast. 03/28/18   [name], MD  metFORMIN (GLUCOPHAGE) 500 MG tablet Take 500 mg by mouth 1 day or 1 dose.    [provider]  metoprolol succinate (TOPROL XL) 25 MG 24 hr tablet Take 1 tablet (25 mg total) by mouth daily. 05/05/18   Minna Merritts, MD  nitroGLYCERIN (NITROSTAT) 0.4 MG SL tablet Place 1 tablet (0.4 mg total) under the tongue every 5 (five) minutes as needed for chest pain. 06/02/17   Minna Merritts, MD  ondansetron (ZOFRAN) 4 MG tablet Take 1 tablet (4 mg total) by mouth every 8 (eight) hours as needed. 04/29/18   Mar Daring, PA-C  pantoprazole (PROTONIX) 40 MG tablet Take 1 tablet (40 mg total) by mouth 2 (two) times daily. 03/02/18   [name], MD  rifaximin (XIFAXAN) 200 MG tablet Take 1 tablet (200 mg total) by mouth 3 (three) times daily. 06/01/18   Mar Daring, PA-C  rosuvastatin (CRESTOR) 40 MG tablet Take 1 tablet (40 mg total) by mouth daily. 03/01/18 05/30/18  Theora Gianotti, NP  tiZANidine (ZANAFLEX) 4 MG tablet Take 4 mg by mouth daily as needed for pain. 03/07/18   [provider]    Allergies Lipitor [atorvastatin] and Tramadol    Social History Social History   Tobacco Use  . Smoking status: Former Smoker    Types: Cigarettes    Last attempt to quit: 08/31/1994    Years since quitting: 23.8  . Smokeless tobacco: Never Used  . Tobacco comment: quit 28 years ago  Substance Use Topics  . Alcohol use: No    Alcohol/week: 0.0 standard drinks  . Drug use: No    Review of Systems Patient denies headaches, rhinorrhea, blurry vision, numbness, shortness of breath, chest pain, edema, cough, abdominal pain, nausea, vomiting, diarrhea, dysuria, fevers, rashes or hallucinations unless otherwise stated above in HPI. ____________________________________________   PHYSICAL EXAM:  VITAL SIGNS: Vitals:   06/14/18 2110 06/14/18 2200  BP: 106/63 114/69  Pulse: (!) 57 60  Resp: 14 13  Temp:    SpO2: 96% 95%    Constitutional: Alert drowsy and chronically ill appearing Eyes: Conjunctivae are pale .  Head: Atraumatic. Nose: No congestion/rhinnorhea. Mouth/Throat: Mucous membranes are moist.   Neck: No stridor. Painless ROM.  Cardiovascular: Normal rate, regular rhythm. Grossly normal heart sounds.  Good peripheral circulation. Respiratory: Normal respiratory effort.  No retractions. Lungs CTAB. Gastrointestinal: Soft and nontender. No distention. No abdominal bruits. No CVA tenderness. Genitourinary: deferred Musculoskeletal: No lower extremity tenderness nor edema.  No joint effusions. Neurologic:   Slurred speech speech and language. No drift. No gross new focal neurologic deficits are appreciated. right facial droop Skin:  Skin is warm, dry and intact. No rash noted. Psychiatric: Mood and affect are normal. Speech and behavior are normal.  ____________________________________________   LABS (all labs ordered are  listed, but only abnormal results are displayed)  Results for orders placed or performed during the hospital encounter of 06/14/18 (from the past 24 hour(s))  Basic metabolic panel     Status: Abnormal   Collection Time: 06/14/18  7:50 PM  Result Value Ref Range   Sodium 131 (L) 135 - 145 mmol/L   Potassium 3.9 3.5 - 5.1 mmol/L   Chloride 98 98 - 111 mmol/L   CO2 22 22 - 32 mmol/L   Glucose, Bld 147 (H) 70 - 99 mg/dL   BUN 21 (H) 6 - 20 mg/dL   Creatinine, Ser 2.16 (H) 0.44 - 1.00 mg/dL   Calcium 8.4 (L) 8.9 - 10.3 mg/dL   GFR calc non Af Amer 26 (L) >60 mL/min   GFR calc Af Amer 30 (L) >60 mL/min   Anion gap 11 5 - 15  CBC     Status: Abnormal   Collection Time: 06/14/18  7:50 PM  Result Value Ref Range   WBC 9.7 4.0 - 10.5 K/uL   RBC 4.17 3.87 - 5.11 MIL/uL   Hemoglobin 8.3 (L) 12.0 - 15.0 g/dL   HCT 27.7 (L) 36.0 - 46.0 %   MCV 66.4 (L) 80.0 - 100.0 fL   MCH 19.9 (L) 26.0 - 34.0 pg   MCHC 30.0 30.0 - 36.0 g/dL   RDW 15.6 (H) 11.5 - 15.5 %   Platelets 227 150 - 400 K/uL   nRBC 0.0 0.0 - 0.2 %  Glucose, capillary     Status: None   Collection Time: 06/14/18  8:41 PM  Result Value Ref Range   Glucose-Capillary 92 70 - 99 mg/dL  Urinalysis, Complete w Microscopic     Status: Abnormal   Collection Time: 06/14/18  9:21 PM  Result Value Ref Range   Color, Urine YELLOW (A) YELLOW   APPearance CLOUDY (A) CLEAR   Specific Gravity, Urine 1.005 1.005 - 1.030   pH 6.0 5.0 - 8.0   Glucose, UA NEGATIVE NEGATIVE mg/dL   Hgb urine dipstick SMALL (A) NEGATIVE   Bilirubin Urine NEGATIVE NEGATIVE   Ketones, ur NEGATIVE NEGATIVE mg/dL   Protein, ur NEGATIVE NEGATIVE mg/dL   Nitrite POSITIVE (A) NEGATIVE   Leukocytes,Ua LARGE (A) NEGATIVE   RBC / HPF 6-10 0 - 5 RBC/hpf   WBC, UA 11-20 0 - 5 WBC/hpf   Bacteria, UA NONE SEEN NONE SEEN   Squamous Epithelial / LPF 0-5 0 - 5   WBC Clumps PRESENT    Mucus PRESENT    Amorphous Crystal PRESENT   Pregnancy, urine     Status: None    Collection Time: 06/14/18  9:21 PM  Result Value Ref Range   Preg Test, Ur NEGATIVE NEGATIVE   ____________________________________________  EKG My review and personal interpretation at Time: 19:43   Indication: weakness  Rate: 60  Rhythm: sinus Axis: normal Other: nonspecific st abn, no stemi ____________________________________________  RADIOLOGY  I personally reviewed all radiographic images ordered to evaluate for the above acute complaints and reviewed radiology reports and findings.  These findings were personally discussed with the patient.  Please see medical record for radiology report.  ____________________________________________   PROCEDURES  Procedure(s) performed:  .Critical Care Performed by: Merlyn Lot, MD Authorized by: Merlyn Lot, MD   Critical care provider statement:    Critical care time (minutes):  30   Critical care time was exclusive of:  Separately billable procedures and treating other patients   Critical care was necessary to treat  or prevent imminent or life-threatening deterioration of the following conditions:  Dehydration   Critical care was time spent personally by me on the following activities:  Development of treatment plan with patient or surrogate, discussions with consultants, evaluation of patient's response to treatment, examination of patient, obtaining history from patient or surrogate, ordering and performing treatments and interventions, ordering and review of laboratory studies, ordering and review of radiographic studies, pulse oximetry, re-evaluation of patient's condition and review of old charts      Critical Care performed: yes ____________________________________________   INITIAL IMPRESSION / ASSESSMENT AND PLAN / ED COURSE  Pertinent labs & imaging results that were available during my care of the patient were reviewed by me and considered in my medical decision making (see chart for details).   DDX:  Dehydration, sepsis, pna, uti, hypoglycemia, cva, drug effect, withdrawal, encephalitis   [place] [name] is a 48 y.o. who presents to the ED with weakness slurred speech and hypotension as described above.  Patient appears pale. No signs of GIB.   Describing generalized weakness and drowsiness.  Given her hypotension I suspect that is the source of her symptoms we will give IV fluids will order blood work.  Will order CT imaging radiographs urinalysis for by differential.  Anticipate patient will require admission the hospital.  Clinical Course as of Jun 14 2222  Tue Jun 14, 2018  2039 CBG monitoring, ED [PR]  2218 Symptoms are suggestive of urosepsis.  She does not have any fever or white count.  Suspect that low blood pressure more likely secondary to dehydration is the patient reports not eating or drinking as much.  No flank pain to suggest stone.  Patient stable and appropriate for admission to the hospital.   [PR]    Clinical Course User Index [PR] Merlyn Lot, MD    The patient was evaluated in Emergency Department today for the symptoms described in the history of present illness. He/she was evaluated in the context of the global COVID-19 pandemic, which necessitated consideration that the patient might be at risk for infection with the SARS-CoV-2 virus that causes COVID-19. Institutional protocols and algorithms that pertain to the evaluation of patients at risk for COVID-19 are in a state of rapid change based on information released by regulatory bodies including the CDC and federal and state organizations. These policies and algorithms were followed during the patient's care in the ED.  As part of my medical decision making, I reviewed the following data within the [facility] notes reviewed and incorporated, Labs reviewed, notes from prior ED visits and  Controlled Substance Database   ____________________________________________   FINAL  CLINICAL IMPRESSION(S) / ED DIAGNOSES  Final diagnoses:  Acute cystitis without hematuria  Dehydration  Acute encephalopathy      NEW MEDICATIONS STARTED DURING THIS VISIT:  New Prescriptions   No medications on file     Note:  This document was prepared using Dragon voice recognition software and may include unintentional dictation errors.    Merlyn Lot, MD 06/14/18 2224

## 2018-06-14 NOTE — ED Notes (Signed)
Pt st feeling "tired". Pt denies CP,SHOB, abd pain, N/V/D at this time.

## 2018-06-14 NOTE — ED Triage Notes (Addendum)
Pt to triage via w/c, pt with eyes closed, general lethagy; pt reports dizziness & weakness since this morning; st "my son thinks I've had another stroke; my parents called me today and said I was slurring my words"; denies any recent illness, denies any accomp symptoms

## 2018-06-14 NOTE — ED Notes (Signed)
ED TO INPATIENT HANDOFF REPORT  ED Nurse Name and Phone #: Danise Mina [phone]  S Name/Age/Gender [name] 48 y.o. female Room/Bed: ED09A/ED09A  Code Status   Code Status: Prior  Home/SNF/Other Home A/Ox4   Triage Complete: Triage complete  Chief Complaint Weakness  Triage Note Pt to triage via w/c, pt with eyes closed, general lethagy; pt reports dizziness & weakness since this morning; st "my son thinks I've had another stroke; my parents called me today and said I was slurring my words"; denies any recent illness, denies any accomp symptoms   Allergies Allergies  Allergen Reactions  . Lipitor [Atorvastatin] Other (See Comments)    Leg pains  . Tramadol Other (See Comments)    Mouth feels like it's on fire    Level of Care/Admitting Diagnosis ED Disposition    ED Disposition Condition Comment   Admit  The patient appears reasonably stabilized for admission considering the current resources, flow, and capabilities available in the ED at this time, and I doubt any other [facility] requiring further screening and/or treatment in the ED prior to admission is  present.       B Medical/Surgery History Past Medical History:  Diagnosis Date  . Acute pyelonephritis   . Anemia    iron deficiency anemia  . Aortic arch aneurysm ([place])   . Bipolar disorder ([place])   . BRCA negative 2014  . CAD (coronary artery disease)    a. 08/2003 Cath: LAD 30-40-med Rx; b. 11/2014 PCI: LAD 76m(3.25x23 Xience Alpine DES); c. 06/2015 PCI: D1 (2.25x12 Resolute Integrity DES); d. 06/2017 PCI: Patent mLAD stent, D2 95 (PTCA); e. 09/2017 PCI: D2 99ost (CBA); d. 12/2017 Cath: LM nl, LAD 3525m80d (small), D1 40ost, D2 95ost, LCX 40p, RCA 40ost/p->Med rx for D2 given restenosis.  . CKD (chronic kidney disease), stage III ([place]  . Colon polyp   . CVA (cerebral vascular accident) ([facility]   Left side weakness.   . Diabetes (HCP[place]  . Family history of breast cancer    BRCA neg 2014  . Gastric ulcer  04/27/2011  . History of echocardiogram    a. 03/2017 Echo: EF 60-65%, no rwma; b. 02/2018 Echo: EF 60-65%, no rwma. Nl RV fxn. No cardiac source of emboli (admitted w/ stroke).  . [place] (hypertension)   . Hyperlipemia   . Hypothyroid   . Malignant melanoma of skin of scalp ([facility]  . MI, acute, non ST segment elevation ([facility]  . Neuromuscular disorder ([place]  . Orthostatic hypotension   . S/P drug eluting coronary stent placement 06/04/2015  . Sepsis (HCAngelica2/14/2019  . [facility]   a. 02/2018 MRI: 25m64mate acute/early subacute L medial frontal lobe inarct; b. 02/2018 MRA No large vessel occlusion or aneurysm. Mod to sev L P2 stenosis. thready L vertebral artery, diffusely dzs'd; c. 02/2018 Carotid U/S: <50% bilat ICA dzs.   Past Surgical History:  Procedure Laterality Date  . APPENDECTOMY    . CARDIAC CATHETERIZATION N/A 11/09/2014   Procedure: Coronary Angiography;  Surgeon: TimMinna MerrittsD;  [place];  Service: Cardiovascular;  Laterality: N/A;  . CARDIAC CATHETERIZATION N/A 11/12/2014   Procedure: Coronary Stent Intervention;  Surgeon: AleIsaias CowmanD;  [place];  Service: Cardiovascular;  Laterality: N/A;  . CARDIAC CATHETERIZATION N/A 04/18/2015   Procedure: Left Heart Cath and Coronary Angiography;  Surgeon: TimMinna MerrittsD;  [place];  Service: Cardiovascular;  Laterality: N/A;  . CARDIAC CATHETERIZATION Left 06/04/2015   Procedure: Left Heart Cath and Coronary Angiography;  Surgeon: Wellington Hampshire, MD;  Location: [place] CV LAB;  Service: Cardiovascular;  Laterality: Left;  . CARDIAC CATHETERIZATION N/A 06/04/2015   Procedure: Coronary Stent Intervention;  Surgeon: Wellington Hampshire, MD;  Location: [place] [facility] LAB;  Service: Cardiovascular;  Laterality: N/A;  . CESAREAN SECTION  2001  . CHOLECYSTECTOMY N/A 11/18/2016   Procedure: LAPAROSCOPIC CHOLECYSTECTOMY WITH INTRAOPERATIVE CHOLANGIOGRAM;   Surgeon: Christene Lye, MD;  Location: [facility];  Service: General;  Laterality: N/A;  . COLONOSCOPY WITH PROPOFOL N/A 04/27/2016   Procedure: COLONOSCOPY WITH PROPOFOL;  Surgeon: [name], MD;  Location: [place];  Service: Endoscopy;  Laterality: N/A;  . COLONOSCOPY WITH PROPOFOL N/A 01/12/2018   Procedure: COLONOSCOPY WITH PROPOFOL;  Surgeon: Toledo, Benay Pike, MD;  Location: [facility] ENDOSCOPY;  Service: Endoscopy;  Laterality: N/A;  . CORONARY ANGIOPLASTY    . CORONARY BALLOON ANGIOPLASTY N/A 06/29/2017   Procedure: CORONARY BALLOON ANGIOPLASTY;  Surgeon: Wellington Hampshire, MD;  Location: [place] [facility] LAB;  Service: Cardiovascular;  Laterality: N/A;  . CORONARY BALLOON ANGIOPLASTY N/A 09/20/2017   Procedure: CORONARY BALLOON ANGIOPLASTY;  Surgeon: Wellington Hampshire, MD;  Location: [place] [facility] LAB;  Service: Cardiovascular;  Laterality: N/A;  . DILATION AND CURETTAGE OF UTERUS    . ESOPHAGOGASTRODUODENOSCOPY (EGD) WITH PROPOFOL N/A 09/14/2014   Procedure: ESOPHAGOGASTRODUODENOSCOPY (EGD) WITH PROPOFOL;  Surgeon: Josefine Class, MD;  Location[facility];  Service: Endoscopy;  Laterality: N/A;  . ESOPHAGOGASTRODUODENOSCOPY (EGD) WITH PROPOFOL N/A 04/27/2016   Procedure: ESOPHAGOGASTRODUODENOSCOPY (EGD) WITH PROPOFOL;  Surgeon: [name], MD;  Location: [place];  Service: Endoscopy;  Laterality: N/A;  Diabetic - oral meds  . ESOPHAGOGASTRODUODENOSCOPY (EGD) WITH PROPOFOL N/A 01/12/2018   Procedure: ESOPHAGOGASTRODUODENOSCOPY (EGD) WITH PROPOFOL;  Surgeon: Toledo, Benay Pike, MD;  Location: [facility] ENDOSCOPY;  Service: Endoscopy;  Laterality: N/A;  . GASTRIC BYPASS  09/2009   [facility]   . Left Carotid to sublcavian artery bypass w/ subclavian artery ligation     a. Performed @ [place].  . LEFT HEART CATH AND CORONARY ANGIOGRAPHY Left 06/29/2017   Procedure: LEFT HEART CATH AND CORONARY ANGIOGRAPHY;  Surgeon: Wellington Hampshire, MD;   Location: [place] CV LAB;  Service: Cardiovascular;  Laterality: Left;  . LEFT HEART CATH AND CORONARY ANGIOGRAPHY N/A 09/20/2017   Procedure: LEFT HEART CATH AND CORONARY ANGIOGRAPHY;  Surgeon: Wellington Hampshire, MD;  Location: [place] CV LAB;  Service: Cardiovascular;  Laterality: N/A;  . LEFT HEART CATH AND CORONARY ANGIOGRAPHY N/A 12/20/2017   Procedure: LEFT HEART CATH AND CORONARY ANGIOGRAPHY;  Surgeon: Wellington Hampshire, MD;  Location: [name] [facility];  Service: Cardiovascular;  Laterality: N/A;  . MELANOMA EXCISION  2016   Dr. Evorn Gong  . [place]  2002  . RIGHT OOPHORECTOMY    . SHOULDER ARTHROSCOPY WITH OPEN ROTATOR CUFF REPAIR Right 01/07/2016   Procedure: SHOULDER ARTHROSCOPY WITH DEBRIDMENT, SUBACHROMIAL DECOMPRESSION;  Surgeon: Corky Mull, MD;  Location: [facility];  Service: Orthopedics;  Laterality: Right;  . SHOULDER ARTHROSCOPY WITH OPEN ROTATOR CUFF REPAIR Right 03/16/2017   Procedure: SHOULDER ARTHROSCOPY WITH OPEN ROTATOR CUFF REPAIR POSSIBLE BICEPS TENODESIS;  Surgeon: Corky Mull, MD;  Location: [facility];  Service: Orthopedics;  Laterality: Right;  . TRIGGER FINGER RELEASE Right     Middle Finger     A IV Location/Drains/Wounds Patient  Lines/Drains/Airways Status   Active Line/Drains/Airways    Name:   Placement date:   Placement time:   Site:   Days:   Peripheral IV 06/14/18 Left Antecubital   06/14/18    1945    Antecubital   less than 1          Intake/Output Last 24 hours  Intake/Output Summary (Last 24 hours) at 06/14/2018 2231 Last data filed at 06/14/2018 2106 Gross per 24 hour  Intake 1000 ml  Output -  Net 1000 ml    Labs/Imaging Results for orders placed or performed during the hospital encounter of 06/14/18 (from the past 48 hour(s))  Basic metabolic panel     Status: Abnormal   Collection Time: 06/14/18  7:50 PM  Result Value Ref Range   Sodium 131 (L) 135 - 145 mmol/L   Potassium 3.9 3.5 - 5.1 mmol/L   Chloride 98  98 - 111 mmol/L   CO2 22 22 - 32 mmol/L   Glucose, Bld 147 (H) 70 - 99 mg/dL   BUN 21 (H) 6 - 20 mg/dL   Creatinine, Ser 2.16 (H) 0.44 - 1.00 mg/dL   Calcium 8.4 (L) 8.9 - 10.3 mg/dL   GFR calc non Af Amer 26 (L) >60 mL/min   GFR calc Af Amer 30 (L) >60 mL/min   Anion gap 11 5 - 15    Comment: Performed at [facility], [address] [place], [place] [address]  CBC     Status: Abnormal   Collection Time: 06/14/18  7:50 PM  Result Value Ref Range   WBC 9.7 4.0 - 10.5 K/uL   RBC 4.17 3.87 - 5.11 MIL/uL   Hemoglobin 8.3 (L) 12.0 - 15.0 g/dL    Comment: Reticulocyte Hemoglobin testing may be clinically indicated, consider ordering this additional test [id number]    HCT 27.7 (L) 36.0 - 46.0 %   MCV 66.4 (L) 80.0 - 100.0 fL   MCH 19.9 (L) 26.0 - 34.0 pg   MCHC 30.0 30.0 - 36.0 g/dL   RDW 15.6 (H) 11.5 - 15.5 %   Platelets 227 150 - 400 K/uL   nRBC 0.0 0.0 - 0.2 %    Comment: Performed at [facility], [address] [place], [place] [address]  Glucose, capillary     Status: None   Collection Time: 06/14/18  8:41 PM  Result Value Ref Range   Glucose-Capillary 92 70 - 99 mg/dL  Urinalysis, Complete w Microscopic     Status: Abnormal   Collection Time: 06/14/18  9:21 PM  Result Value Ref Range   Color, Urine YELLOW (A) YELLOW   APPearance CLOUDY (A) CLEAR   Specific Gravity, Urine 1.005 1.005 - 1.030   pH 6.0 5.0 - 8.0   Glucose, UA NEGATIVE NEGATIVE mg/dL   Hgb urine dipstick SMALL (A) NEGATIVE   Bilirubin Urine NEGATIVE NEGATIVE   Ketones, ur NEGATIVE NEGATIVE mg/dL   Protein, ur NEGATIVE NEGATIVE mg/dL   Nitrite POSITIVE (A) NEGATIVE   Leukocytes,Ua LARGE (A) NEGATIVE   RBC / HPF 6-10 0 - 5 RBC/hpf   WBC, UA 11-20 0 - 5 WBC/hpf   Bacteria, UA NONE SEEN NONE SEEN   Squamous Epithelial / LPF 0-5 0 - 5   WBC Clumps PRESENT    Mucus PRESENT    Amorphous Crystal PRESENT     Comment: Performed at [facility], [address] [place],  [place] [address]  Pregnancy, urine     Status:  None   Collection Time: 06/14/18  9:21 PM  Result Value Ref Range   Preg Test, Ur NEGATIVE NEGATIVE    Comment: Performed at [facility], [address] [place], [place] [address]   Ct Head Wo Contrast  Result Date: 06/14/2018 CLINICAL DATA:  Headache and dizziness today. EXAM: CT HEAD WITHOUT CONTRAST TECHNIQUE: Contiguous axial images were obtained from the base of the skull through the vertex without intravenous contrast. COMPARISON:  Head CT scan 04/05/2018 and 12/23/2017. Brain MRI 02/17/2018. FINDINGS: Brain: No evidence of acute infarction, hemorrhage, hydrocephalus, extra-axial collection or mass lesion/mass effect. Age advanced chronic microvascular ischemic disease is unchanged in appearance. Vascular: No hyperdense vessel or unexpected calcification. Skull: Intact.  No focal lesion. Sinuses/Orbits: Negative. Other: None. IMPRESSION: No acute abnormality. Age advanced chronic microvascular ischemic disease is unchanged in appearance. Electronically Signed   By: Inge Rise M.D.   On: 06/14/2018 21:01   Dg Chest Portable 1 View  Result Date: 06/14/2018 CLINICAL DATA:  Dizziness and hypotension EXAM: PORTABLE CHEST 1 VIEW COMPARISON:  March 16, 2018 FINDINGS: No edema or consolidation. Heart is upper normal in size with pulmonary vascularity normal. No adenopathy. Surgical clips in the left paratracheal region are stable. No pneumothorax. No bone lesions. IMPRESSION: No edema or consolidation.  Stable cardiac silhouette. Electronically Signed   By: Lowella Grip III M.D.   On: 06/14/2018 21:56    Pending Labs Unresulted Labs (From admission, onward)    Start     Ordered   06/14/18 2138  Hepatic function panel  Add-on,   AD     06/14/18 2137   06/14/18 2138  Troponin I - Add-On to previous collection  Add-on,   AD     06/14/18 2137   06/14/18 2036  SARS Coronavirus 2 (CEPHEID - Performed in [place] hospital lab), Hosp  Order  (Asymptomatic Patients Labs)  Once,   STAT    Question:  Rule Out  Answer:  Yes   06/14/18 2036          Vitals/Pain Today's Vitals   06/14/18 2120 06/14/18 2140 06/14/18 2200 06/14/18 2220  BP: 101/70 120/70 114/69 120/73  Pulse: 62 (!) 57 60 62  Resp: _0 Temp:      TempSrc:      SpO2: 96% 97% 95% 97%  Weight:      Height:      PainSc:        Isolation Precautions No active isolations  Medications Medications  cefTRIAXone (ROCEPHIN) 1 g in sodium chloride 0.9 % 100 mL IVPB (1 g Intravenous New Bag/Given 06/14/18 2215)  sodium chloride 0.9 % bolus 1,000 mL (0 mLs Intravenous Stopped 06/14/18 2106)  0.9 %  sodium chloride infusion ( Intravenous New Bag/Given 06/14/18 2105)    Mobility walks Low fall risk   Focused Assessments    R Recommendations: See Admitting Provider Note  Report given to:   Additional Notes:

## 2018-06-15 ENCOUNTER — Other Ambulatory Visit: Payer: Self-pay

## 2018-06-15 ENCOUNTER — Inpatient Hospital Stay: Payer: Medicare Other

## 2018-06-15 ENCOUNTER — Telehealth: Payer: Self-pay | Admitting: Cardiovascular Disease

## 2018-06-15 LAB — BASIC METABOLIC PANEL
Anion gap: 9 (ref 5–15)
BUN: 21 mg/dL — ABNORMAL HIGH (ref 6–20)
CO2: 20 mmol/L — ABNORMAL LOW (ref 22–32)
Calcium: 8 mg/dL — ABNORMAL LOW (ref 8.9–10.3)
Chloride: 108 mmol/L (ref 98–111)
Creatinine, Ser: 1.81 mg/dL — ABNORMAL HIGH (ref 0.44–1.00)
GFR calc Af Amer: 37 mL/min — ABNORMAL LOW (ref >60)
GFR calc non Af Amer: 32 mL/min — ABNORMAL LOW (ref >60)
Glucose, Bld: 164 mg/dL — ABNORMAL HIGH (ref 70–99)
Potassium: 3.9 mmol/L (ref 3.5–5.1)
Sodium: 137 mmol/L (ref 135–145)

## 2018-06-15 LAB — LIPID PANEL
Cholesterol: 80 mg/dL (ref 0–200)
HDL: 31 mg/dL — ABNORMAL LOW (ref >40)
LDL Cholesterol: 31 mg/dL (ref 0–99)
Total CHOL/HDL Ratio: 2.6 RATIO
Triglycerides: 91 mg/dL (ref <150)
VLDL: 18 mg/dL (ref 0–40)

## 2018-06-15 LAB — LACTIC ACID, PLASMA
Lactic Acid, Venous: 1.3 mmol/L (ref 0.5–1.9)
Lactic Acid, Venous: 1.6 mmol/L (ref 0.5–1.9)
Lactic Acid, Venous: 1.2 mmol/L (ref 0.5–1.9)

## 2018-06-15 LAB — BRAIN NATRIURETIC PEPTIDE: B Natriuretic Peptide: 67 pg/mL (ref 0.0–100.0)

## 2018-06-15 LAB — HEMOGLOBIN A1C
Hgb A1c MFr Bld: 5.9 % — ABNORMAL HIGH (ref 4.8–5.6)
Mean Plasma Glucose: 122.63 mg/dL

## 2018-06-15 LAB — TSH: TSH: 1.256 u[IU]/mL (ref 0.350–4.500)

## 2018-06-15 LAB — CBC
HCT: 26.3 % — ABNORMAL LOW (ref 36.0–46.0)
Hemoglobin: 7.8 g/dL — ABNORMAL LOW (ref 12.0–15.0)
MCH: 19.6 pg — ABNORMAL LOW (ref 26.0–34.0)
MCHC: 29.7 g/dL — ABNORMAL LOW (ref 30.0–36.0)
MCV: 66.2 fL — ABNORMAL LOW (ref 80.0–100.0)
Platelets: 180 10*3/uL (ref 150–400)
RBC: 3.97 MIL/uL (ref 3.87–5.11)
RDW: 15.6 % — ABNORMAL HIGH (ref 11.5–15.5)
WBC: 7.6 10*3/uL (ref 4.0–10.5)
nRBC: 0 % (ref 0.0–0.2)

## 2018-06-15 LAB — PROTIME-INR
INR: 1.1 (ref 0.8–1.2)
Prothrombin Time: 14.5 seconds (ref 11.4–15.2)

## 2018-06-15 LAB — CORTISOL-AM, BLOOD: Cortisol - AM: 8.8 ug/dL (ref 6.7–22.6)

## 2018-06-15 LAB — PROCALCITONIN: Procalcitonin: <0.1 ng/mL

## 2018-06-15 MED ORDER — SODIUM CHLORIDE 0.9 % IV SOLN
INTRAVENOUS | Status: DC | PRN
  Administered 2018-06-15: 22:00:00 250 mL via INTRAVENOUS

## 2018-06-15 MED ORDER — PANTOPRAZOLE SODIUM 40 MG PO TBEC
40.0000 mg | DELAYED_RELEASE_TABLET | Freq: Two times a day (BID) | ORAL | Status: DC
  Administered 2018-06-15 – 2018-06-16 (×3): 40 mg via ORAL
  Filled 2018-06-15 (×3): qty 1

## 2018-06-15 MED ORDER — BUPROPION HCL ER (XL) 150 MG PO TB24
150.0000 mg | ORAL_TABLET | Freq: Every day | ORAL | Status: DC
  Administered 2018-06-15 – 2018-06-16 (×2): 150 mg via ORAL
  Filled 2018-06-15 (×2): qty 1

## 2018-06-15 MED ORDER — ALPRAZOLAM 0.25 MG PO TABS
0.2500 mg | ORAL_TABLET | Freq: Two times a day (BID) | ORAL | Status: DC | PRN
  Administered 2018-06-15: 23:00:00 0.25 mg via ORAL
  Filled 2018-06-15: qty 1

## 2018-06-15 MED ORDER — LEVOTHYROXINE SODIUM 25 MCG PO TABS
25.0000 ug | ORAL_TABLET | Freq: Every day | ORAL | Status: DC
  Administered 2018-06-15 – 2018-06-16 (×2): 25 ug via ORAL
  Filled 2018-06-15 (×2): qty 1

## 2018-06-15 MED ORDER — CITALOPRAM HYDROBROMIDE 20 MG PO TABS
40.0000 mg | ORAL_TABLET | Freq: Every day | ORAL | Status: DC
  Administered 2018-06-15 – 2018-06-16 (×2): 40 mg via ORAL
  Filled 2018-06-15 (×2): qty 2

## 2018-06-15 MED ORDER — AMLODIPINE BESYLATE 5 MG PO TABS
5.0000 mg | ORAL_TABLET | Freq: Two times a day (BID) | ORAL | Status: DC
  Administered 2018-06-15 – 2018-06-16 (×3): 5 mg via ORAL
  Filled 2018-06-15 (×3): qty 1

## 2018-06-15 MED ORDER — METOPROLOL SUCCINATE ER 25 MG PO TB24
25.0000 mg | ORAL_TABLET | Freq: Every day | ORAL | Status: DC
  Administered 2018-06-15: 25 mg via ORAL
  Administered 2018-06-16: 09:00:00 12.5 mg via ORAL
  Filled 2018-06-15 (×2): qty 1

## 2018-06-15 MED ORDER — RIFAXIMIN 200 MG PO TABS
200.0000 mg | ORAL_TABLET | Freq: Three times a day (TID) | ORAL | Status: DC
  Administered 2018-06-15 – 2018-06-16 (×4): 200 mg via ORAL
  Filled 2018-06-15 (×6): qty 1

## 2018-06-15 MED ORDER — CLOPIDOGREL BISULFATE 75 MG PO TABS
75.0000 mg | ORAL_TABLET | Freq: Every day | ORAL | Status: DC
  Administered 2018-06-15 – 2018-06-16 (×2): 75 mg via ORAL
  Filled 2018-06-15 (×2): qty 1

## 2018-06-15 MED ORDER — ROSUVASTATIN CALCIUM 20 MG PO TABS
40.0000 mg | ORAL_TABLET | Freq: Every day | ORAL | Status: DC
  Administered 2018-06-15 – 2018-06-16 (×2): 40 mg via ORAL
  Filled 2018-06-15 (×3): qty 2

## 2018-06-15 MED ORDER — ISOSORBIDE MONONITRATE ER 30 MG PO TB24
30.0000 mg | ORAL_TABLET | Freq: Every day | ORAL | Status: DC
  Administered 2018-06-15 – 2018-06-16 (×2): 30 mg via ORAL
  Filled 2018-06-15 (×2): qty 1

## 2018-06-15 NOTE — Evaluation (Signed)
Physical Therapy Evaluation Patient Details Name: [name] MRN: [id number] DOB: [birth year] Today's Date: 06/15/2018   History of Present Illness  48 y.o. female with a known history of CVA x4 reporting her most recent TIA/CVA having been 2 to 3 months ago.  Patient also has a history of coronary artery disease, CKD, chronic anemia, and bipolar disorder.  She presented to the emergency room with complaint of generalized weakness with slurred speech which began the a.m. of 06/14/2018. Admitted for dizziness and stroke workup, MRI neg.    Clinical Impression  Pt alert, sitting EOB with OT at start of session. Pt exhibited generalized weakness LUE&LLE mildly weaker than R (hx of previous strokes), as well as mild coordination deficits of LLE. Pt was able to transfer and ambulate >18ft with IV pole mod I. Higher level balance assessed, pt with most difficulty with narrow base of support and decreased visual input, exhibited mild-mod sway. Overall the patient demonstrated deficits that impede pt functional and recreational activities/mobility and would benefit from further skilled PT intervention. Recommendation at this time is HHPT.    Follow Up Recommendations Home health PT    Equipment Recommendations  None recommended by PT(pt has RW and SPC)    Recommendations for Other Services       Precautions / Restrictions Precautions Precautions: Fall Restrictions Weight Bearing Restrictions: No      Mobility  Bed Mobility Overal bed mobility: Independent                Transfers Overall transfer level: Modified independent               General transfer comment: Pt sitting EOB with OT at start of session  Ambulation/Gait Ambulation/Gait assistance: Modified independent (Device/Increase time) Gait Distance (Feet): 180 Feet Assistive device: IV Pole Gait Pattern/deviations: WFL(Within Functional Limits)        Stairs            Wheelchair Mobility     Modified Rankin (Stroke Patients Only)       Balance Overall balance assessment: Needs assistance Sitting-balance support: Feet supported Sitting balance-Leahy Scale: Normal           Single Leg Stance - Right Leg: 0 Single Leg Stance - Left Leg: 0     [place]berg - Eyes Opened: 15 [place] - Eyes Closed: 10   High Level Balance Comments: Pt challenged by decreased base of support, unable to perform single leg stance             Pertinent Vitals/Pain Pain Assessment: No/denies pain    Home Living Family/patient expects to be discharged to:: Private residence Living Arrangements: Children(son) Available Help at Discharge: Family;Available PRN/intermittently(son goes to work at 3p) Type of Home: House Home Access: Stairs to enter Entrance Stairs-Rails: Right Entrance Stairs-Number of Steps: [place]: One level Home Equipment: Environmental consultant - 4 wheels;Cane - single point;Shower seat      Prior Function Level of Independence: Needs assistance   Gait / Transfers Assistance Needed: uses rollator in the home, SPC in community  ADL's / Homemaking Assistance Needed: indep with ADL, cooking, and med mgt; son drives and assists with groceries, housework, etc.  Comments: pt endorses 4 falls in past 12 months 2/2 LOB. PLOF information gathered from OT.     Hand Dominance   Dominant Hand: Right    Extremity/Trunk Assessment   Upper Extremity Assessment Upper Extremity Assessment: LUE deficits/detail;RUE deficits/detail RUE Deficits / Details: grossly 4/5 RUE Sensation: WNL  RUE Coordination: WNL LUE Deficits / Details: grossly 4-/5, slight impairments in [facility] from past CVA LUE Sensation: WNL LUE Coordination: decreased fine motor    Lower Extremity Assessment Lower Extremity Assessment:RLE deficits/detail;Generalized weakness;LLE deficits/detail RLE Deficits / Details: grossly 4/5 LLE Deficits / Details: grossly 4-/5, mild deficit compared to RLE LLE  Coordination: decreased gross motor    Cervical / Trunk Assessment Cervical / Trunk Assessment: Normal  Communication   Communication: No difficulties  Cognition Arousal/Alertness: Awake/alert Behavior During Therapy: WFL for tasks assessed/performed Overall Cognitive Status: Within Functional Limits for tasks assessed                                        General Comments      Exercises Other Exercises Other Exercises: Pt reported having HHPT previously, open to further rehab to address weakness/balance deficits   Assessment/Plan    PT Assessment Patient needs continued PT services  PT Problem List Decreased strength;Decreased mobility;Decreased balance       PT Treatment Interventions DME instruction;Functional mobility training;Balance training;Patient/family education;Gait training;Therapeutic activities;Neuromuscular re-education;Stair training;Therapeutic exercise    PT Goals (Current goals can be found in the Care Plan section)  Acute Rehab PT Goals Patient Stated Goal: to go home PT Goal Formulation: With patient Time For Goal Achievement: 06/29/18 Potential to Achieve Goals: Good    Frequency Min 2X/week   Barriers to discharge        Co-evaluation               AM-PAC PT "6 Clicks" Mobility  Outcome Measure Help needed turning from your back to your side while in a flat bed without using bedrails?: None Help needed moving from lying on your back to sitting on the side of a flat bed without using bedrails?: None Help needed moving to and from a bed to a chair (including a wheelchair)?: None Help needed standing up from a chair using your arms (e.g., wheelchair or bedside chair)?: None Help needed to walk in hospital room?: A Little Help needed climbing 3-5 steps with a railing? : A Little 6 Click Score: 22    End of Session Equipment Utilized During Treatment: Gait belt Activity Tolerance: Patient tolerated treatment  well Patient left: in chair;with call bell/phone within reach Nurse Communication: Mobility status PT Visit Diagnosis: Unsteadiness on feet (R26.81);Other abnormalities of gait and mobility (R26.89)    Time: 1751-0258 PT Time Calculation (min) (ACUTE ONLY): 13 min   Charges:   PT Evaluation $PT Eval Low Complexity: 1 Low          Lieutenant Diego PT, DPT 3:52 PM,06/15/18 [phone]

## 2018-06-15 NOTE — H&P (Addendum)
[facility] NAME: [name]    MR#:  [id number]  DATE OF BIRTH:  1970-08-14  DATE OF ADMISSION:  06/14/2018  PRIMARY CARE PHYSICIAN: [name], MD   REQUESTING/REFERRING PHYSICIAN: Merlyn Lot, MD  CHIEF COMPLAINT:   Chief Complaint  Patient presents with  . Dizziness    HISTORY OF PRESENT ILLNESS:  [name]  is a 48 y.o. female with a known history of CVA x4 reporting her most recent TIA/CVA having been 2 to 3 months ago.  Patient also has a history of coronary artery disease, CKD, chronic anemia, and bipolar disorder.  She presented to the emergency room with complaint of generalized weakness with slurred speech which began the a.m. of 06/14/2018.  Patient also noted generalized malaise throughout the day.  She reports dizziness particularly on standing.  Patient endorses bilateral weakness at her baseline as a result of previous strokes using cane or walker for ambulation assistance.  She has noted no blurred vision or diplopia.  She denies facial drooping.  She denies nausea, vomiting, diarrhea.  She denies abdominal pain.  She denies chest pain or shortness of breath.  She denies fevers or chills.  Patient has noted no unilateral increased weakness.  She denies dysuria or urgency.  However she has noted foul urine odor.  On review of records echocardiogram was completed in January 2020 with ejection fraction 60 to 65%.  Patient follows up with Dr. [name].  On arrival to the emergency room she was hypotensive with blood pressure 73/54.  She received 1 L fluid bolus with blood pressure increasing to 120/73.  Urinalysis demonstrates large leukocytes and positive nitrites.  CT brain demonstrates no acute intracranial abnormalities.  Chest x-ray shows no acute pulmonary disease.   We have admitted her to the hospitalist service for rule out CVA and urinary tract infection. PAST MEDICAL HISTORY:   Past Medical  History:  Diagnosis Date  . Acute pyelonephritis   . Anemia    iron deficiency anemia  . Aortic arch aneurysm ([place])   . Bipolar disorder ([place])   . BRCA negative 2014  . CAD (coronary artery disease)    a. 08/2003 Cath: LAD 30-40-med Rx; b. 11/2014 PCI: LAD 91m(3.25x23 Xience Alpine DES); c. 06/2015 PCI: D1 (2.25x12 Resolute Integrity DES); d. 06/2017 PCI: Patent mLAD stent, D2 95 (PTCA); e. 09/2017 PCI: D2 99ost (CBA); d. 12/2017 Cath: LM nl, LAD 326m80d (small), D1 40ost, D2 95ost, LCX 40p, RCA 40ost/p->Med rx for D2 given restenosis.  . CKD (chronic kidney disease), stage III ([facility]  . Colon polyp   . CVA (cerebral vascular accident) ([facility]   Left side weakness.   . Diabetes ([facility]  . Family history of breast cancer    BRCA neg 2014  . Gastric ulcer 04/27/2011  . History of echocardiogram    a. 03/2017 Echo: EF 60-65%, no rwma; b. 02/2018 Echo: EF 60-65%, no rwma. Nl RV fxn. No cardiac source of emboli (admitted w/ stroke).  . [place] (hypertension)   . Hyperlipemia   . Hypothyroid   . Malignant melanoma of skin of scalp ([facility]  . MI, acute, non ST segment elevation (HCC[facility]  . Neuromuscular disorder ([facility]  . Orthostatic hypotension   . S/P drug eluting coronary stent placement 06/04/2015  . Sepsis (HC[facility]2/14/2019  . [facility]   a. 02/2018 MRI: 21m29mate acute/early subacute L medial  frontal lobe inarct; b. 02/2018 MRA No large vessel occlusion or aneurysm. Mod to sev L P2 stenosis. thready L vertebral artery, diffusely dzs'd; c. 02/2018 Carotid U/S: <50% bilat ICA dzs.    PAST SURGICAL HISTORY:   Past Surgical History:  Procedure Laterality Date  . APPENDECTOMY    . CARDIAC CATHETERIZATION N/A 11/09/2014   Procedure: Coronary Angiography;  Surgeon: Minna Merritts, MD;  Location: [name] [facility];  Service: Cardiovascular;  Laterality: N/A;  . CARDIAC CATHETERIZATION N/A 11/12/2014   Procedure: Coronary Stent Intervention;  Surgeon: Isaias Cowman, MD;  Location: [place] [facility];  Service: Cardiovascular;  Laterality: N/A;  . CARDIAC CATHETERIZATION N/A 04/18/2015   Procedure: Left Heart Cath and Coronary Angiography;  Surgeon: Minna Merritts, MD;  Location: [place] [facility];  Service: Cardiovascular;  Laterality: N/A;  . CARDIAC CATHETERIZATION Left 06/04/2015   Procedure: Left Heart Cath and Coronary Angiography;  Surgeon: Wellington Hampshire, MD;  Location: [place] [facility];  Service: Cardiovascular;  Laterality: Left;  . CARDIAC CATHETERIZATION N/A 06/04/2015   Procedure: Coronary Stent Intervention;  Surgeon: Wellington Hampshire, MD;  Location: [place] [facility];  Service: Cardiovascular;  Laterality: N/A;  . CESAREAN SECTION  2001  . CHOLECYSTECTOMY N/A 11/18/2016   Procedure: LAPAROSCOPIC CHOLECYSTECTOMY WITH INTRAOPERATIVE CHOLANGIOGRAM;  Surgeon: Christene Lye, MD;  Location: [facility];  Service: General;  Laterality: N/A;  . COLONOSCOPY WITH PROPOFOL N/A 04/27/2016   Procedure: COLONOSCOPY WITH PROPOFOL;  Surgeon: [name], MD;  Location: [place];  Service: Endoscopy;  Laterality: N/A;  . COLONOSCOPY WITH PROPOFOL N/A 01/12/2018   Procedure: COLONOSCOPY WITH PROPOFOL;  Surgeon: Toledo, Benay [place]ike, MD;  Location: [facility];  Service: Endoscopy;  Laterality: N/A;  . CORONARY ANGIOPLASTY    . CORONARY BALLOON ANGIOPLASTY N/A 06/29/2017   Procedure: CORONARY BALLOON ANGIOPLASTY;  Surgeon: Wellington Hampshire, MD;  Location: [place] [facility];  Service: Cardiovascular;  Laterality: N/A;  . CORONARY BALLOON ANGIOPLASTY N/A 09/20/2017   Procedure: CORONARY BALLOON ANGIOPLASTY;  Surgeon: Wellington Hampshire, MD;  Location: [place] [facility];  Service: Cardiovascular;  Laterality: N/A;  . DILATION AND CURETTAGE OF UTERUS    . ESOPHAGOGASTRODUODENOSCOPY (EGD) WITH PROPOFOL N/A 09/14/2014   Procedure: ESOPHAGOGASTRODUODENOSCOPY (EGD) WITH PROPOFOL;  Surgeon: Josefine Class, MD;  Location: [facility];  Service:  Endoscopy;  Laterality: N/A;  . ESOPHAGOGASTRODUODENOSCOPY (EGD) WITH PROPOFOL N/A 04/27/2016   Procedure: ESOPHAGOGASTRODUODENOSCOPY (EGD) WITH PROPOFOL;  Surgeon: [name], MD;  Location: [place];  Service: Endoscopy;  Laterality: N/A;  Diabetic - oral meds  . ESOPHAGOGASTRODUODENOSCOPY (EGD) WITH PROPOFOL N/A 01/12/2018   Procedure: ESOPHAGOGASTRODUODENOSCOPY (EGD) WITH PROPOFOL;  Surgeon: Toledo, Benay [place], MD;  Location: [facility] END[facility];  Service: Endoscopy;  Laterality: N/A;  . GASTRIC BYPASS  09/2009   [facility]   . Left Carotid to sublcavian artery bypass w/ subclavian artery ligation     a. Performed @ [place].  . LEFT HEART CATH AND CORONARY ANGIOGRAPHY Left 06/29/2017   Procedure: LEFT HEART CATH AND CORONARY ANGIOGRAPHY;  Surgeon: Wellington Hampshire, MD;  Location: [place] [facility];  Service: Cardiovascular;  Laterality: Left;  . LEFT HEART CATH AND CORONARY ANGIOGRAPHY N/A 09/20/2017   Procedure: LEFT HEART CATH AND CORONARY ANGIOGRAPHY;  Surgeon: Wellington Hampshire, MD;  Location: [place] [facility];  Service: Cardiovascular;  Laterality: N/A;  . LEFT HEART CATH AND CORONARY ANGIOGRAPHY N/A 12/20/2017   Procedure: LEFT HEART CATH AND  CORONARY ANGIOGRAPHY;  Surgeon: Wellington Hampshire, MD;  Location: [place] [facility];  Service: Cardiovascular;  Laterality: N/A;  . MELANOMA EXCISION  2016   Dr. Evorn Gong  . [place]  2002  . RIGHT OOPHORECTOMY    . SHOULDER ARTHROSCOPY WITH OPEN ROTATOR CUFF REPAIR Right 01/07/2016   Procedure: SHOULDER ARTHROSCOPY WITH DEBRIDMENT, SUBACHROMIAL DECOMPRESSION;  Surgeon: Corky Mull, MD;  Location: [facility];  Service: Orthopedics;  Laterality: Right;  . SHOULDER ARTHROSCOPY WITH OPEN ROTATOR CUFF REPAIR Right 03/16/2017   Procedure: SHOULDER ARTHROSCOPY WITH OPEN ROTATOR CUFF REPAIR POSSIBLE BICEPS TENODESIS;  Surgeon: Corky Mull, MD;  Location: [facility];  Service: Orthopedics;  Laterality: Right;  .  TRIGGER FINGER RELEASE Right     [place]    SOCIAL HISTORY:   Social History   Tobacco Use  . Smoking status: Former Smoker    Types: Cigarettes    Last attempt to quit: 08/31/1994    Years since quitting: 23.8  . Smokeless tobacco: Never Used  . Tobacco comment: quit 28 years ago  Substance Use Topics  . Alcohol use: No    Alcohol/week: 0.0 standard drinks    FAMILY HISTORY:   Family History  Problem Relation Age of Onset  . Hypertension Mother   . Anxiety disorder Mother   . Depression Mother   . Bipolar disorder Mother   . Heart disease Mother        No details  . Hyperlipidemia Mother   . Kidney disease Father   . Heart disease Father 28  . Hypertension Father   . Diabetes Father   . Stroke Father   . Colon cancer Father        dx in his 63's  . Anxiety disorder Father   . Depression Father   . Skin cancer Father   . Kidney disease Sister   . Thyroid nodules Sister   . Hypertension Sister   . Hypertension Sister   . Diabetes Sister   . Hyperlipidemia Sister   . Depression Sister   . Breast cancer Maternal Aunt 19  . Breast cancer Maternal Aunt 54  . Ovarian cancer Cousin   . Colon cancer Cousin   . Breast cancer Other   . Kidney cancer Neg Hx   . Bladder Cancer Neg Hx     DRUG ALLERGIES:   Allergies  Allergen Reactions  . Lipitor [Atorvastatin] Other (See Comments)    Leg pains  . Tramadol Other (See Comments)    Mouth feels like it's on fire    REVIEW OF SYSTEMS:   Review of Systems  Constitutional: Positive for malaise/fatigue. Negative for chills and fever.  HENT: Negative for congestion and sore throat.   Eyes: Negative for blurred vision, double vision and pain.  Respiratory: Negative for cough, shortness of breath and wheezing.   Cardiovascular: Negative for chest pain and PND.  Gastrointestinal: Negative for abdominal pain, constipation, diarrhea, heartburn, nausea and vomiting.  Genitourinary: Positive for frequency and  urgency. Negative for dysuria and flank pain.  Musculoskeletal: Negative for falls and myalgias.  Skin: Negative for itching and rash.  Neurological: Positive for dizziness, speech change (slurred speech) and weakness. Negative for loss of consciousness.  Psychiatric/Behavioral: Negative.       MEDICATIONS AT HOME:   Prior to Admission medications   Medication Sig Start Date End Date Taking? Authorizing Provider  ALPRAZolam Duanne Moron) 0.5 MG tablet Take 0.5-1 tablets (0.25-0.5 mg total) by mouth 2 (two) times daily as  needed for anxiety. 05/27/18  Yes [name], MD  amLODipine (NORVASC) 5 MG tablet Take 1 tablet (5 mg total) by mouth 2 (two) times daily. 05/05/18 08/03/18 Yes Gollan, Kathlene November, MD  Blood Glucose Monitoring Suppl (ONE TOUCH ULTRA 2) w/Device KIT Use to check blood sugar once a day. Dx. E11.9 03/18/17  Yes [name], MD  buPROPion (WELLBUTRIN XL) 150 MG 24 hr tablet TAKE 1 TABLET BY MOUTH DAILY 08/10/17  Yes [name], MD  citalopram (CELEXA) 40 MG tablet Take 1 tablet (40 mg total) by mouth daily. 06/08/17  Yes [name], MD  clopidogrel (PLAVIX) 75 MG tablet Take 75 mg by mouth daily.   Yes [provider]  gabapentin (NEURONTIN) 300 MG capsule Take 1 capsule (300 mg total) by mouth 2 (two) times daily. 06/08/18  Yes [name], MD  isosorbide mononitrate (IMDUR) 120 MG 24 hr tablet Take 1 tablet by mouth once daily 06/09/18  Yes Dunn, Areta Haber, PA-C  levothyroxine (SYNTHROID, LEVOTHROID) 25 MCG tablet Take 1 tablet (25 mcg total) by mouth daily before breakfast. 03/28/18  Yes [name], MD  metFORMIN (GLUCOPHAGE) 500 MG tablet Take 500 mg by mouth 1 day or 1 dose.   Yes [provider]  metoprolol succinate (TOPROL XL) 25 MG 24 hr tablet Take 1 tablet (25 mg total) by mouth daily. 05/05/18  Yes Gollan, Kathlene November, MD  pantoprazole (PROTONIX) 40 MG tablet Take 1 tablet (40 mg total) by mouth 2 (two) times daily. 03/02/18  Yes [name], MD  rifaximin (XIFAXAN) 200 MG tablet Take 1 tablet (200 mg total) by mouth 3 (three) times daily. 06/01/18  Yes Mar Daring, PA-C  rosuvastatin (CRESTOR) 40 MG tablet Take 1 tablet (40 mg total) by mouth daily. 03/01/18 06/14/18 Yes Theora Gianotti, NP  tiZANidine (ZANAFLEX) 4 MG tablet Take 4 mg by mouth daily as needed for pain. 03/07/18  Yes [provider]  nitroGLYCERIN (NITROSTAT) 0.4 MG SL tablet Place 1 tablet (0.4 mg total) under the tongue every 5 (five) minutes as needed for chest pain. 06/02/17   Minna Merritts, MD  ondansetron (ZOFRAN) 4 MG tablet Take 1 tablet (4 mg total) by mouth every 8 (eight) hours as needed. 04/29/18   Mar Daring, PA-C      VITAL SIGNS:  Blood pressure 136/74, pulse 64, temperature 97.8 F (36.6 C), temperature source Oral, resp. rate 15, height '5\' 3"'  (1.6 m), weight 80.1 kg, SpO2 100 %.  PHYSICAL EXAMINATION:  Physical Exam Constitutional:      General: She is not in acute distress.    Appearance: She is not ill-appearing.  HENT:     Head: Normocephalic.     Right Ear: External ear normal.     Left Ear: External ear normal.     Nose: No congestion or rhinorrhea.  Eyes:     General: No scleral icterus.    Extraocular Movements: Extraocular movements intact.     Conjunctiva/sclera: Conjunctivae normal.     Pupils: Pupils are equal, round, and reactive to light.  Neck:     Musculoskeletal: Normal range of motion and neck supple. No muscular tenderness.  Cardiovascular:     Rate and Rhythm: Normal rate and regular rhythm.     Pulses: Normal pulses.     Heart sounds: Normal heart sounds.  Pulmonary:     Effort: Pulmonary effort is normal. No respiratory distress.  Breath sounds: Normal breath sounds. No wheezing, rhonchi or rales.  Abdominal:     General: Abdomen is flat. Bowel sounds are normal. There is no distension.     Palpations: Abdomen is soft. There is no mass.     Tenderness: There is no  abdominal tenderness.     Hernia: No hernia is present.  Musculoskeletal: Normal range of motion.        General: No swelling or tenderness.     Right lower leg: No edema.     Left lower leg: No edema.  Skin:    General: Skin is warm and dry.     Capillary Refill: Capillary refill takes less than 2 seconds.     Findings: No rash.  Neurological:     Mental Status: She is alert and oriented to person, place, and time.     Cranial Nerves: Cranial nerve deficit (slurred speech) present.     Motor: Weakness (bilateral upper and lower 3/5) present.  Psychiatric:        Mood and Affect: Mood normal.        Behavior: Behavior normal.      LABORATORY PANEL:   CBC Recent Labs  Lab 06/14/18 1950  WBC 9.7  HGB 8.3*  HCT 27.7*  PLT 227   ------------------------------------------------------------------------------------------------------------------  Chemistries  Recent Labs  Lab 06/14/18 1950  NA 131*  K 3.9  CL 98  CO2 22  GLUCOSE 147*  BUN 21*  CREATININE 2.16*  CALCIUM 8.4*  AST 41  ALT 46*  ALKPHOS 115  BILITOT 0.4   ------------------------------------------------------------------------------------------------------------------  Cardiac Enzymes Recent Labs  Lab 06/14/18 1950  TROPONINI <0.03   ------------------------------------------------------------------------------------------------------------------  RADIOLOGY:  Ct Head Wo Contrast  Result Date: 06/14/2018 CLINICAL DATA:  Headache and dizziness today. EXAM: CT HEAD WITHOUT CONTRAST TECHNIQUE: Contiguous axial images were obtained from the base of the skull through the vertex without intravenous contrast. COMPARISON:  Head CT scan 04/05/2018 and 12/23/2017. Brain MRI 02/17/2018. FINDINGS: Brain: No evidence of acute infarction, hemorrhage, hydrocephalus, extra-axial collection or mass lesion/mass effect. Age advanced chronic microvascular ischemic disease is unchanged in appearance. Vascular: No  hyperdense vessel or unexpected calcification. Skull: Intact.  No focal lesion. Sinuses/Orbits: Negative. Other: None. IMPRESSION: No acute abnormality. Age advanced chronic microvascular ischemic disease is unchanged in appearance. Electronically Signed   By: Inge Rise M.D.   On: 06/14/2018 21:01   Dg Chest Portable 1 View  Result Date: 06/14/2018 CLINICAL DATA:  Dizziness and hypotension EXAM: PORTABLE CHEST 1 VIEW COMPARISON:  March 16, 2018 FINDINGS: No edema or consolidation. Heart is upper normal in size with pulmonary vascularity normal. No adenopathy. Surgical clips in the left paratracheal region are stable. No pneumothorax. No bone lesions. IMPRESSION: No edema or consolidation.  Stable cardiac silhouette. Electronically Signed   By: Lowella Grip III M.D.   On: 06/14/2018 21:56      IMPRESSION AND PLAN:   1 TIA versus CVA - We will continue every 2 hours neurologic checks - Aspirin and Plavix have been resumed - Recent echocardiogram results in January 2020 reviewed with 60 to 65% ejection fraction - Statin therapy resumed -We will get lipid panel - Neurology consulted for further recommendations - Speech therapy for swallowing evaluation - Physical therapy for generalized weakness - MRI/MRA in the a.m. - Carotid ultrasound  2.  Urinary tract infection - IV antibiotic therapy with Rocephin - Urine culture pending - Repeat CBC BMP in the a.m.  3.  Diabetes mellitus -  Metformin restarted - Moderate sliding scale insulin - Hemoglobin A1c in a.m.  4.  Hypotension - Resolved after fluid bolus given in the emergency room - We will hold antihypertensives for now and restart when blood pressure has stabilized  5.  History of systolic CHF - Will restart metoprolol and Imdur upon resolution of hypotension  6.Hypothyroidism --TSH in the a.m. -Synthroid restarted  DVT and PPI prophylaxis have been initiated   All the records are reviewed and case discussed  with ED provider. The plan of care was discussed in details with the patient (and family). I answered all questions. The patient agreed to proceed with the above mentioned plan. Further management will depend upon hospital course.   CODE STATUS: Full code  TOTAL TIME TAKING CARE OF THIS PATIENT:16mnutes.    AJerico Springson 06/15/2018 at 1:22 AM  Pager - [phone] After 6pm go to [email or web address] - [email or web address] [facility] [place] [facility]  Office  [phone] CC: Primary care physician; FBirdie Sons MD   Attending physician admission note:  I have seen and examined the patient with Ms. AGardiner Barefoot CRNP on 06/14/2018.  The patient presents with acute onset of slurred speech as well as generalized weakness and dizziness.  Head CT scan came back without acute intracranial abnormalities.  She was noted to have a UTI.  Upon physical examination:  Generally: Pleasant elderly [name] in no acute distress Cardiovascular: Regular rate and rhythm with normal S1-S2 and no murmurs gallops or rubs. Respiratory: Clear to auscultation bilaterally Abdomen: Soft, nontender, nondistended with positive bowel sounds and no palpable organomegaly or masses. Extremities: No edema clubbing or cyanosis Neurologically: Mildly slurred speech otherwise exam is nonfocal.  Labs and radiographic studies: were all reviewed  Assessment/plan: The patient be admitted to a medically monitored bed for further management of possible TIA, rule out CVA.  We will continue aspirin Plavix and obtain a brain MRI/MRA in a.m.  We will manage her UTI with IV Rocephin.  For further details please refer to dictated admission H&P.  I have discussed the case with my nurse practitioner. I agree with the admission note and the rest of the plan of care as delineated by Ms. AGardiner Barefoot CRNP.   Note: This dictation was prepared with Dragon dictation along with smaller phrase  technology. Any transcriptional errors that result from this process are unintentional.

## 2018-06-15 NOTE — Evaluation (Signed)
Occupational Therapy Evaluation Patient Details Name: [name] MRN: [id number] DOB: Apr 12, 1970 Today's Date: 06/15/2018    History of Present Illness 48 y.o. female with a known history of CVA x4 reporting her most recent TIA/CVA having been 2 to 3 months ago.  Patient also has a history of coronary artery disease, CKD, chronic anemia, and bipolar disorder.  She presented to the emergency room with complaint of generalized weakness with slurred speech which began the a.m. of 06/14/2018. Admitted for dizziness and stroke workup, MRI neg.   Clinical Impression   Pt seen for OT evaluation this date. Pt was independent in [name] ADLs and using a rollator for in-home functional mobility and SPC in the community. Her son who she lives with drives and assists with driving, groceries, and housekeeping tasks. Pt indep with cooking and med mgt. Pt reports becoming easily fatigued or out of breath with minimize exertion and currently requiring assist and/or additional time/effort to complete daily routine tasks. Pt currently is mod indep with ADL. Pt educated in energy conservation conservation strategies including pursed lip breathing, activity pacing, home/routines modifications, work simplification, AE/DME, prioritizing of meaningful occupations, and falls prevention. Handout provided. Pt verbalized understanding. No additional acute OT needs at this time. Will sign off.      Follow Up Recommendations  No OT follow up    Equipment Recommendations       Recommendations for Other Services       Precautions / Restrictions Precautions Precautions: Fall Restrictions Weight Bearing Restrictions: No      Mobility Bed Mobility Overal bed mobility: Independent                Transfers Overall transfer level: Modified independent               General transfer comment: no difficulty noted    Balance Overall balance assessment: No apparent balance deficits (not formally  assessed)                                         ADL either performed or assessed with clinical judgement   ADL Overall ADL's : At baseline                                       General ADL Comments: pt able to perform at baseline independence - does fatigue (at baseline)     Vision Baseline Vision/History: Wears glasses Wears Glasses: At [name] times Patient Visual Report: No change from baseline Vision Assessment?: No apparent visual deficits     Perception     Praxis      Pertinent Vitals/Pain Pain Assessment: No/denies pain     Hand Dominance Right   Extremity/Trunk Assessment Upper Extremity Assessment Upper Extremity Assessment: Generalized weakness;RUE deficits/detail;LUE deficits/detail RUE Deficits / Details: grossly 4/5 LUE Deficits / Details: grossly 4-/5, slight impairments in [facility] from past CVA LUE Coordination: decreased fine motor   Lower Extremity Assessment Lower Extremity Assessment: Defer to PT evaluation;RLE deficits/detail;Generalized weakness;LLE deficits/detail(pt endorses decreased sensation intermittently BLE (not new)) RLE Deficits / Details: grossly 4/5 LLE Deficits / Details: grossly 4-/5   Cervical / Trunk Assessment Cervical / Trunk Assessment: Normal   Communication Communication Communication: No difficulties   Cognition Arousal/Alertness: Awake/alert Behavior During Therapy: WFL for tasks assessed/performed Overall Cognitive Status: Within  Functional Limits for tasks assessed                                     General Comments       Exercises Other Exercises Other Exercises: pt instructed in energy conservation strategies including work simplification, falls prevention, pursed lip breathing, activity pacing, and AE/DME; handout provided   Shoulder Instructions      Home Living Family/patient expects to be discharged to:: Private residence Living Arrangements:  Children(son) Available Help at Discharge: Family;Available PRN/intermittently(son goes to work at [employer]) Type of Home: House Home Access: Stairs to enter [employer] of Steps: 10 Entrance Stairs-Rails: Right Home Layout: One level     Bathroom Shower/Tub: Tub/shower unit;Curtain   Biochemist, clinical: Standard     Home Equipment: Environmental consultant - 4 wheels;Cane - single point;Shower seat          Prior Functioning/Environment Level of Independence: Needs assistance  Gait / Transfers Assistance Needed: uses rollator in the home, SPC in community ADL's / Homemaking Assistance Needed: indep with ADL, cooking, and med Leisure centre manager; son drives and assists with groceries, housework, etc.   Comments: pt endorses 4 falls in past 12 months 2/2 LOB        OT Problem List: Decreased strength;Decreased activity tolerance;Decreased knowledge of use of DME or AE      OT Treatment/Interventions:      OT Goals(Current goals can be found in the care plan section) Acute Rehab OT Goals Patient Stated Goal: go home OT Goal Formulation: [name] assessment and education complete, DC therapy  OT Frequency:     Barriers to D/C:            Co-evaluation              AM-PAC OT "6 Clicks" Daily Activity     Outcome Measure Help from another person eating meals?: None Help from another person taking care of personal grooming?: None Help from another person toileting, which includes using toliet, bedpan, or urinal?: None Help from another person bathing (including washing, rinsing, drying)?: None Help from another person to put on and taking off regular upper body clothing?: None Help from another person to put on and taking off regular lower body clothing?: None 6 Click Score: 24   End of Session    Activity Tolerance: Patient tolerated treatment well Patient left: in bed;with call bell/phone within reach;Other (comment)(with PT)  OT Visit Diagnosis: Muscle weakness (generalized) (M62.81)                 Time: 9528-4132 OT Time Calculation (min): 22 min Charges:  OT General Charges $OT Visit: 1 Visit OT Evaluation $OT Eval Low Complexity: 1 Low OT Treatments $Self Care/Home Management : 8-22 mins  Jeni Salles, MPH, MS, OTR/L ascom [phone] 06/15/18, 2:00 PM

## 2018-06-15 NOTE — ED Notes (Signed)
Called to give report. Unable to take pt at this time and will call this RN back

## 2018-06-15 NOTE — Progress Notes (Signed)
Took over pt care at 1515, pt alert, no complaints, voices needs

## 2018-06-15 NOTE — Evaluation (Signed)
Speech Language Pathology Evaluation Patient Details Name: [name] MRN: [id number] DOB: Jun 06, 1970 Today's Date: 06/15/2018 Time: 0165-5374 SLP Time Calculation (min) (ACUTE ONLY): 29 min  Problem List:  Patient Active Problem List   Diagnosis Date Noted  . Sepsis ([place]) 06/14/2018  . Autonomic neuropathy 03/24/2018  . Acute delirium 03/03/2018  . H/O gastric bypass 03/02/2018  . MI, acute, non ST segment elevation ([place])   . Effort angina ([place]) 06/29/2017  . Unstable angina ([place]) 06/24/2017  . Syncope 04/09/2017  . Insomnia 03/18/2017  . Ischemic cardiomyopathy   . Arthritis   . Anxiety   . Acute colitis 01/27/2017  . Hx of colonic polyps   . H/O medication noncompliance 12/14/2015  . Emesis   . Atherosclerosis of native coronary artery of native heart with stable angina pectoris ([place])   . Hypertensive heart disease   . CKD (chronic kidney disease), stage III ([place]) 06/05/2015  . Presence of drug coated stent in LAD coronary artery & now in D1 06/04/2015  . Colitis 06/03/2015  . Carotid stenosis 04/30/2015  . Type 2 diabetes mellitus with stage 3 chronic kidney disease, without long-term current use of insulin ([place])   . Stable angina pectoris ([place]) 04/17/2015  . Iron deficiency anemia 03/22/2015  . Vitamin B12 deficiency 02/18/2015  . Misuse of medications for pain 02/18/2015  . Major depressive disorder, recurrent, severe with psychotic features ([place]) 02/15/2015  . Helicobacter pylori infection 11/23/2014  . Hemiparesis, left ([place]) 11/23/2014  . Benign neoplasm of colon 11/20/2014  . Malignant melanoma ([place]) 08/25/2014  . Chronic systolic CHF (congestive heart failure) ([place])   . Incomplete bladder emptying 07/12/2014  . Adult hypothyroidism 12/30/2013  . Aberrant subclavian artery 11/17/2013  . Multiple sclerosis ([place]) 11/02/2013  . History of CVA (cerebrovascular accident) 06/20/2013  . Headache, migraine 05/29/2013  . Hyperlipidemia   . GERD  (gastroesophageal reflux disease)   . Neuropathy ([place]) 01/02/2011  . Stroke ([place]) 06/21/2008  . Essential hypertension 05/01/2008   Past Medical History:  Past Medical History:  Diagnosis Date  . Acute pyelonephritis   . Anemia    iron deficiency anemia  . Aortic arch aneurysm ([place])   . Bipolar disorder ([place])   . BRCA negative 2014  . CAD (coronary artery disease)    a. 08/2003 Cath: LAD 30-40-med Rx; b. 11/2014 PCI: LAD 40m(3.25x23 Xience Alpine DES); c. 06/2015 PCI: D1 (2.25x12 Resolute Integrity DES); d. 06/2017 PCI: Patent mLAD stent, D2 95 (PTCA); e. 09/2017 PCI: D2 99ost (CBA); d. 12/2017 Cath: LM nl, LAD 328m80d (small), D1 40ost, D2 95ost, LCX 40p, RCA 40ost/p->Med rx for D2 given restenosis.  . CKD (chronic kidney disease), stage III (HCGuthrie  . Colon polyp   . CVA (cerebral vascular accident) ([place]   Left side weakness.   . Diabetes (HCDent  . Family history of breast cancer    BRCA neg 2014  . Gastric ulcer 04/27/2011  . History of echocardiogram    a. 03/2017 Echo: EF 60-65%, no rwma; b. 02/2018 Echo: EF 60-65%, no rwma. Nl RV fxn. No cardiac source of emboli (admitted w/ stroke).  . [place] (hypertension)   . Hyperlipemia   . Hypothyroid   . Malignant melanoma of skin of scalp (HCColby  . MI, acute, non ST segment elevation (HCSpencer  . Neuromuscular disorder (HCBruni  . Orthostatic hypotension   . S/P drug eluting coronary stent placement 06/04/2015  . Sepsis (HCBerry2/14/2019  . Stroke (  [place])    a. 02/2018 MRI: 36m late acute/early subacute L medial frontal lobe inarct; b. 02/2018 MRA No large vessel occlusion or aneurysm. Mod to sev L P2 stenosis. thready L vertebral artery, diffusely dzs'd; c. 02/2018 Carotid U/S: <50% bilat ICA dzs.   Past Surgical History:  Past Surgical History:  Procedure Laterality Date  . APPENDECTOMY    . CARDIAC CATHETERIZATION N/A 11/09/2014   Procedure: Coronary Angiography;  Surgeon: TMinna Merritts MD;  [facility];  Service:  Cardiovascular;  Laterality: N/A;  . CARDIAC CATHETERIZATION N/A 11/12/2014   Procedure: Coronary Stent Intervention;  Surgeon: AIsaias Cowman MD;  [facility];  Service: Cardiovascular;  Laterality: N/A;  . CARDIAC CATHETERIZATION N/A 04/18/2015   Procedure: Left Heart Cath and Coronary Angiography;  Surgeon: TMinna Merritts MD;  [place];  Service: Cardiovascular;  Laterality: N/A;  . CARDIAC CATHETERIZATION Left 06/04/2015   Procedure: Left Heart Cath and Coronary Angiography;  Surgeon: MWellington Hampshire MD;  [facility];  Service: Cardiovascular;  Laterality: Left;  . CARDIAC CATHETERIZATION N/A 06/04/2015   Procedure: Coronary Stent Intervention;  Surgeon: MWellington Hampshire MD;  [place];  Service: Cardiovascular;  Laterality: N/A;  . CESAREAN SECTION  2001  . CHOLECYSTECTOMY N/A 11/18/2016   Procedure: LAPAROSCOPIC CHOLECYSTECTOMY WITH INTRAOPERATIVE CHOLANGIOGRAM;  Surgeon: SChristene Lye MD;  [facility];  Service: General;  Laterality: N/A;  . COLONOSCOPY WITH PROPOFOL N/A 04/27/2016   Procedure: COLONOSCOPY WITH PROPOFOL;  Surgeon: DLucilla Lame MD;  [place]  Service: Endoscopy;  Laterality: N/A;  . COLONOSCOPY WITH PROPOFOL N/A 01/12/2018   Procedure: COLONOSCOPY WITH PROPOFOL;  Surgeon: Toledo, TBenay Pike MD;  [facility]: [facility];  Service: Endoscopy;  Laterality: N/A;  . CORONARY ANGIOPLASTY    . CORONARY BALLOON ANGIOPLASTY N/A 06/29/2017   Procedure: CORONARY BALLOON ANGIOPLASTY;  Surgeon: AWellington Hampshire MD;  [place];  Service: Cardiovascular;  Laterality: N/A;  . CORONARY BALLOON ANGIOPLASTY N/A 09/20/2017   Procedure: CORONARY BALLOON ANGIOPLASTY;  Surgeon: AWellington Hampshire MD;  [place];  Service: Cardiovascular;  Laterality: N/A;  . DILATION AND CURETTAGE OF UTERUS    . ESOPHAGOGASTRODUODENOSCOPY (EGD)  WITH PROPOFOL N/A 09/14/2014   Procedure: ESOPHAGOGASTRODUODENOSCOPY (EGD) WITH PROPOFOL;  Surgeon: MJosefine Class MD;  [facility];  Service: Endoscopy;  Laterality: N/A;  . ESOPHAGOGASTRODUODENOSCOPY (EGD) WITH PROPOFOL N/A 04/27/2016   Procedure: ESOPHAGOGASTRODUODENOSCOPY (EGD) WITH PROPOFOL;  Surgeon: DLucilla Lame MD;  [place]  Service: Endoscopy;  Laterality: N/A;  Diabetic - oral meds  . ESOPHAGOGASTRODUODENOSCOPY (EGD) WITH PROPOFOL N/A 01/12/2018   Procedure: ESOPHAGOGASTRODUODENOSCOPY (EGD) WITH PROPOFOL;  Surgeon: Toledo, TBenay Pike MD;  [facility];  Service: Endoscopy;  Laterality: N/A;  . [facility]  [facility]  . Left Carotid to sublcavian artery bypass w/ subclavian artery ligation     a. [place]  . LEFT HEART CATH AND CORONARY ANGIOGRAPHY Left 06/29/2017   Procedure: LEFT HEART CATH AND CORONARY ANGIOGRAPHY;  Surgeon: AWellington Hampshire MD;  [place];  Service: Cardiovascular;  Laterality: Left;  . LEFT HEART CATH AND CORONARY ANGIOGRAPHY N/A 09/20/2017   Procedure: LEFT HEART CATH AND CORONARY ANGIOGRAPHY;  Surgeon: AWellington Hampshire MD;  [place];  Service: Cardiovascular;  Laterality: N/A;  . LEFT HEART CATH AND  CORONARY ANGIOGRAPHY N/A 12/20/2017   Procedure: LEFT HEART CATH AND CORONARY ANGIOGRAPHY;  Surgeon: Wellington Hampshire, MD;  [place] [facility];  Service: Cardiovascular;  Laterality: N/A;  . MELANOMA EXCISION  2016   Dr. Evorn Gong  . [place]  2002  . RIGHT OOPHORECTOMY    . SHOULDER ARTHROSCOPY WITH OPEN ROTATOR CUFF REPAIR Right 01/07/2016   Procedure: SHOULDER ARTHROSCOPY WITH DEBRIDMENT, SUBACHROMIAL DECOMPRESSION;  Surgeon: Corky Mull, MD;  [facility];  Service: Orthopedics;  Laterality: Right;  . SHOULDER ARTHROSCOPY WITH OPEN ROTATOR CUFF REPAIR Right 03/16/2017   Procedure: SHOULDER  ARTHROSCOPY WITH OPEN ROTATOR CUFF REPAIR POSSIBLE BICEPS TENODESIS;  Surgeon: Corky Mull, MD;  [facility]: [facility];  Service: Orthopedics;  Laterality: Right;  . TRIGGER FINGER RELEASE Right     [place]   HPI:  H&P 06/14/2018: [place] [address] [name]  is a 48 y.o. female with a known history of CVA x4 reporting her most recent TIA/CVA having been 2 to 3 months ago.  Patient also has a history of coronary artery disease, CKD, chronic anemia, and bipolar disorder.  She presented to the emergency room with complaint of generalized weakness with slurred speech which began the a.m. of 06/14/2018.  Patient also noted generalized malaise throughout the day.  She reports dizziness particularly on standing.  Patient endorses bilateral weakness at her baseline as a result of previous strokes using cane or walker for ambulation assistance.  She has noted no blurred vision or diplopia.  She denies facial drooping.  She denies nausea, vomiting, diarrhea.  She denies abdominal pain.  She denies chest pain or shortness of breath.  She denies fevers or chills.  Patient has noted no unilateral increased weakness.  She denies dysuria or urgency.  However she has noted foul urine odor.  On review of records echocardiogram was completed in January 2020 with ejection fraction 60 to 65%.  Patient follows up with Dr. [name].  On arrival to the emergency room she was hypotensive with blood pressure 73/54.  She received 1 L fluid bolus with blood pressure increasing to 120/73.  Urinalysis demonstrates large leukocytes and positive nitrites.  CT brain demonstrates no acute intracranial abnormalities.  Chest x-ray shows no acute pulmonary disease. We have admitted her to the hospitalist service for rule out CVA and urinary tract infection.    Assessment / Plan / Recommendation Clinical Impression  The patient is presenting with mild dysarthria of speech characterized by slowed rate, minimally imprecise articulation, and reduced breath  support for speech.  Her speech is fully intelligible.  The patient is presenting with language/cognition/swallowing within normal limits.  Her presentation is similar to her previous presentation 02/18/2018.  She continues to complain of esophageal symptoms.  If speech symptoms persist, the patient may benefit from referral to home health or outpatient speech therapy for dysarthria. The patient does not require inpatient speech therapy, will sign off.    SLP Assessment  SLP Recommendation/Assessment: Patient does not need any further Speech Lanaguage Pathology Services SLP Visit Diagnosis: Dysarthria and anarthria (R47.1)    Follow Up Recommendations  None    Frequency and Duration           SLP Evaluation Cognition  Overall Cognitive Status: Within Functional Limits for tasks assessed       Comprehension  Auditory Comprehension Overall Auditory Comprehension: Appears within functional limits for tasks assessed    Expression Verbal Expression Overall Verbal Expression: Appears within functional limits for tasks assessed  Oral / Motor  Oral Motor/Sensory Function Overall Oral Motor/Sensory Function: Mild impairment Motor Speech Overall Motor Speech: Impaired at baseline   GO                   Leroy Sea, MS/CCC- SLP  Valetta Fuller, Susie 06/15/2018, 9:28 AM

## 2018-06-15 NOTE — Progress Notes (Signed)
Admitted this morning for dizziness, work-up for stroke.  Patient had MRI, MRA of the brain showed no acute abnormality.  Patient admitted recently, history of CVA, CKD, chronic anemia. #1 #1 TIA/CVA: MRI of the brain negative for acute stroke.  Follow ultrasound of carotids, physical therapy, speech therapy. 2.  UTI: Follow urine cultures, continue Rocephin 3.  Diabetes mellitus type 2: Continue metformin, follow hemoglobin A1c. 4.  Hypertension, hypotensive on arrival, improved, reordered amlodipine, metoprolol.  Will order Imdur at a lower dose. #5. hyperlipidemia: Continue Crestor. #6 depression: Celexa to be continued.

## 2018-06-15 NOTE — Telephone Encounter (Signed)
Pt c/o medication issue:  1. Name of Medication: Isosorbide   2. How are you currently taking this medication (dosage and times per day)?   3. Are you having a reaction (difficulty breathing--STAT)? No   4. What is your medication issue? Patient currently admitted and states attending is wanting to change dose .  Patient concerned about this not being approved by [facility].  Please call.

## 2018-06-15 NOTE — Progress Notes (Signed)
[facility] NAME: [name]    MR#:  [id number]  DATE OF BIRTH:  May 19, 1970  SUBJECTIVE: Patient admitted for dizziness and for evaluation of CVA/TIA.  Also noted to have low blood pressure on arrival.  BP better today, stroke work-up is negative.  CHIEF COMPLAINT:   Chief Complaint  Patient presents with  . Dizziness  she says that she is feeling much better, eating lunch.  REVIEW OF SYSTEMS:   ROS CONSTITUTIONAL: No fever, fatigue or weakness.  EYES: No blurred or double vision.  EARS, NOSE, AND THROAT: No tinnitus or ear pain.  RESPIRATORY: No cough, shortness of breath, wheezing or hemoptysis.  CARDIOVASCULAR: No chest pain, orthopnea, edema.  GASTROINTESTINAL: No nausea, vomiting, diarrhea or abdominal pain.  GENITOURINARY: No dysuria, hematuria.  ENDOCRINE: No polyuria, nocturia,  HEMATOLOGY: No anemia, easy bruising or bleeding SKIN: No rash or lesion. MUSCULOSKELETAL: No joint pain or arthritis.   NEUROLOGIC: No tingling, numbness, weakness.  PSYCHIATRY: No anxiety or depression.   DRUG ALLERGIES:   Allergies  Allergen Reactions  . Lipitor [Atorvastatin] Other (See Comments)    Leg pains  . Tramadol Other (See Comments)    Mouth feels like it's on fire    VITALS:  Blood pressure (!) 143/70, pulse 91, temperature 98.7 F (37.1 C), resp. rate 16, height 5\' 3"  (1.6 m), weight 80.1 kg, SpO2 100 %.  PHYSICAL EXAMINATION:  GENERAL:  48 y.o.-year-old patient lying in the bed with no acute distress.  EYES: Pupils equal, round, reactive to light and accommodation. No scleral icterus. Extraocular muscles intact.  HEENT: Head atraumatic, normocephalic. Oropharynx and nasopharynx clear.  NECK:  Supple, no jugular venous distention. No thyroid enlargement, no tenderness.  LUNGS: Normal breath sounds bilaterally, no wheezing, rales,rhonchi or crepitation. No use of accessory muscles of respiration.   CARDIOVASCULAR: S1, S2 normal. No murmurs, rubs, or gallops.  ABDOMEN: Soft, nontender, nondistended. Bowel sounds present. No organomegaly or mass.  EXTREMITIES: No pedal edema, cyanosis, or clubbing.  NEUROLOGIC: Cranial nerves II through XII are intact. Muscle strength 5/5 in all extremities. Sensation intact. Gait not checked.  PSYCHIATRIC: The patient is alert and oriented x 3.  SKIN: No obvious rash, lesion, or ulcer.    LABORATORY PANEL:   CBC Recent Labs  Lab 06/15/18 0457  WBC 7.6  HGB 7.8*  HCT 26.3*  PLT 180   ------------------------------------------------------------------------------------------------------------------  Chemistries  Recent Labs  Lab 06/14/18 1950 06/15/18 0457  NA 131* 137  K 3.9 3.9  CL 98 108  CO2 22 20*  GLUCOSE 147* 164*  BUN 21* 21*  CREATININE 2.16* 1.81*  CALCIUM 8.4* 8.0*  AST 41  --   ALT 46*  --   ALKPHOS 115  --   BILITOT 0.4  --    ------------------------------------------------------------------------------------------------------------------  Cardiac Enzymes Recent Labs  Lab 06/14/18 1950  TROPONINI <0.03   ------------------------------------------------------------------------------------------------------------------  RADIOLOGY:  Ct Head Wo Contrast  Result Date: 06/14/2018 CLINICAL DATA:  Headache and dizziness today. EXAM: CT HEAD WITHOUT CONTRAST TECHNIQUE: Contiguous axial images were obtained from the base of the skull through the vertex without intravenous contrast. COMPARISON:  Head CT scan 04/05/2018 and 12/23/2017. Brain MRI 02/17/2018. FINDINGS: Brain: No evidence of acute infarction, hemorrhage, hydrocephalus, extra-axial collection or mass lesion/mass effect. Age advanced chronic microvascular ischemic disease is unchanged in appearance. Vascular: No hyperdense vessel or unexpected calcification. Skull: Intact.  No focal lesion. Sinuses/Orbits: Negative. Other: None. IMPRESSION: No acute  abnormality.  Age advanced chronic microvascular ischemic disease is unchanged in appearance. Electronically Signed   By: Inge Rise M.D.   On: 06/14/2018 21:01   Mr Jodene Nam Head Wo Contrast  Result Date: 06/15/2018 CLINICAL DATA:  Slurred speech with generalized weakness. EXAM: MRI HEAD WITHOUT CONTRAST MRA HEAD WITHOUT CONTRAST TECHNIQUE: Multiplanar, multiecho pulse sequences of the brain and surrounding structures were obtained without intravenous contrast. Angiographic images of the head were obtained using MRA technique without contrast. COMPARISON:  Head CT 06/14/2018 FINDINGS: MRI HEAD FINDINGS BRAIN: There is no acute infarct, acute hemorrhage or mass effect. The midline structures are normal. Old bilateral small vessel infarcts of the corona radiata. Old subcortical infarct the left parietal lobe. Diffuse confluent hyperintense T2-weighted signal within the periventricular, deep and juxtacortical white matter, most commonly due to chronic ischemic microangiopathy. Hyperintense T2-weighted signal at the brainstem is unchanged. The CSF spaces are normal for age, with no hydrocephalus. Susceptibility-sensitive sequences show no chronic microhemorrhage or superficial siderosis. SKULL AND UPPER CERVICAL SPINE: The visualized skull base, calvarium, upper cervical spine and extracranial soft tissues are normal. SINUSES/ORBITS: No fluid levels or advanced mucosal thickening. No mastoid or middle ear effusion. The orbits are normal. MRA HEAD FINDINGS POSTERIOR CIRCULATION: --Basilar artery: Normal. --Posterior cerebral arteries: Normal right PCA, partially supplied by the right P-comm. The left PCA severely stenotic at the distal P1 segment. Distal PCA is normal. --Superior cerebellar arteries: Normal. --Inferior cerebellar arteries: Normal anterior and posterior inferior cerebellar arteries. ANTERIOR CIRCULATION: --Intracranial internal carotid arteries: Normal. --Anterior cerebral arteries: Normal. Absent left A1  segment, normal variant patent anterior communicating artery. --Middle cerebral arteries: Normal. --Posterior communicating arteries: Present on the right, absent on the left. IMPRESSION: 1. No acute intracranial abnormality. 2. Severe stenosis of the left PCA distal P1 segment, unchanged. 3. Severe chronic microvascular ischemic disease of the white matter with multiple old infarcts. Electronically Signed   By: Ulyses Jarred M.D.   On: 06/15/2018 03:30   Mr Brain Wo Contrast  Result Date: 06/15/2018 CLINICAL DATA:  Slurred speech with generalized weakness. EXAM: MRI HEAD WITHOUT CONTRAST MRA HEAD WITHOUT CONTRAST TECHNIQUE: Multiplanar, multiecho pulse sequences of the brain and surrounding structures were obtained without intravenous contrast. Angiographic images of the head were obtained using MRA technique without contrast. COMPARISON:  Head CT 06/14/2018 FINDINGS: MRI HEAD FINDINGS BRAIN: There is no acute infarct, acute hemorrhage or mass effect. The midline structures are normal. Old bilateral small vessel infarcts of the corona radiata. Old subcortical infarct the left parietal lobe. Diffuse confluent hyperintense T2-weighted signal within the periventricular, deep and juxtacortical white matter, most commonly due to chronic ischemic microangiopathy. Hyperintense T2-weighted signal at the brainstem is unchanged. The CSF spaces are normal for age, with no hydrocephalus. Susceptibility-sensitive sequences show no chronic microhemorrhage or superficial siderosis. SKULL AND UPPER CERVICAL SPINE: The visualized skull base, calvarium, upper cervical spine and extracranial soft tissues are normal. SINUSES/ORBITS: No fluid levels or advanced mucosal thickening. No mastoid or middle ear effusion. The orbits are normal. MRA HEAD FINDINGS POSTERIOR CIRCULATION: --Basilar artery: Normal. --Posterior cerebral arteries: Normal right PCA, partially supplied by the right P-comm. The left PCA severely stenotic at the  distal P1 segment. Distal PCA is normal. --Superior cerebellar arteries: Normal. --Inferior cerebellar arteries: Normal anterior and posterior inferior cerebellar arteries. ANTERIOR CIRCULATION: --Intracranial internal carotid arteries: Normal. --Anterior cerebral arteries: Normal. Absent left A1 segment, normal variant patent anterior communicating artery. --Middle cerebral arteries: Normal. --Posterior communicating arteries: Present on the right, absent on  the left. IMPRESSION: 1. No acute intracranial abnormality. 2. Severe stenosis of the left PCA distal P1 segment, unchanged. 3. Severe chronic microvascular ischemic disease of the white matter with multiple old infarcts. Electronically Signed   By: Ulyses Jarred M.D.   On: 06/15/2018 03:30   US Carotid Bilateral (at [facility] Only)  Result Date: 06/15/2018 CLINICAL DATA:  Slurred speech EXAM: BILATERAL CAROTID DUPLEX ULTRASOUND TECHNIQUE: Pearline Cables scale imaging, color Doppler and duplex ultrasound were performed of bilateral carotid and vertebral arteries in the neck. COMPARISON:  02/18/2018 FINDINGS: Criteria: Quantification of carotid stenosis is based on velocity parameters that correlate the residual internal carotid diameter with NASCET-based stenosis levels, using the diameter of the distal internal carotid lumen as the denominator for stenosis measurement. The following velocity measurements were obtained: RIGHT ICA: 72/31 cm/sec CCA: 06/26 cm/sec SYSTOLIC ICA/CCA RATIO:  1.0 ECA: 65 cm/sec LEFT ICA: 97/32 cm/sec CCA: 948/54 cm/sec SYSTOLIC ICA/CCA RATIO:  0.7 ECA: 83 cm/sec RIGHT CAROTID ARTERY: Minor echogenic shadowing plaque formation. No hemodynamically significant right ICA stenosis, velocity elevation, or turbulent flow. Degree of narrowing less than 50%. RIGHT VERTEBRAL ARTERY:  Antegrade LEFT CAROTID ARTERY: Similar scattered minor echogenic plaque formation. No hemodynamically significant left ICA stenosis, velocity elevation, or turbulent  flow. LEFT VERTEBRAL ARTERY:  Antegrade IMPRESSION: Minor carotid atherosclerosis. No hemodynamically significant ICA stenosis. Degree of narrowing less than 50% bilaterally by ultrasound criteria. Patent antegrade vertebral bilaterally Electronically Signed   By: Jerilynn Mages.  Shick M.D.   On: 06/15/2018 11:04   Dg Chest Portable 1 View  Result Date: 06/14/2018 CLINICAL DATA:  Dizziness and hypotension EXAM: PORTABLE CHEST 1 VIEW COMPARISON:  March 16, 2018 FINDINGS: No edema or consolidation. Heart is upper normal in size with pulmonary vascularity normal. No adenopathy. Surgical clips in the left paratracheal region are stable. No pneumothorax. No bone lesions. IMPRESSION: No edema or consolidation.  Stable cardiac silhouette. Electronically Signed   By: Lowella Grip III M.D.   On: 06/14/2018 21:56    EKG:   Orders placed or performed during the hospital encounter of 06/14/18  . ED EKG  . ED EKG  . ED EKG  . ED EKG  . EKG 12-Lead  . EKG 12-Lead  . EKG 12-Lead  . EKG 12-Lead    ASSESSMENT AND PLAN:   #1 dizziness secondary to hypotension, improved, BP improved, restarted her BP medicines, 2.  History of previous strokes, because of dizziness admitted to stroke unit for evaluation of TIA/CVA, so far stroke work-up has been negative including MRA and MRI of the brain, carotid ultrasound showed less than 50% stenosis bilaterally. #3 mild sepsis and UTI, follow urine cultures, lactic acid decreased from 1.6-1.2.  History of Klebsiella UT in January 2020.  Continue Rocephin.  #4 depression, continue Celexa. History of previous stroke, slight weakness on the left side due to previous stroke no new weakness, states she is feeling better, continue Plavix, Crestor, beta-blockers. #5 hypertension: Improved, restarted her home BP medicines, resume Imdur at a lower dose at 60 mg daily. 6 diabetes mellitus type 2: Continue metformin. Likely discharge home tomorrow.  Discussed with the  patient.  More than 50% time spent in counseling, coordination of care All the records are reviewed and case discussed with Care Management/Social Workerr. Management plans discussed with the patient, family and they are in agreement.  CODE STATUS: Full code  TOTAL TIME TAKING CARE OF THIS PATIENT: 38 minutes.   POSSIBLE D/C IN 1-2 DAYS, DEPENDING ON CLINICAL CONDITION.  Epifanio Lesches M.D on 06/15/2018 at 1:22 PM  Between 7am to 6pm - Pager - [phone]  After 6pm go to [email or web address] - password [email or web address] [facility]  Office  [phone]  CC: Primary care physician; [name], MD   Note: This dictation was prepared with Dragon dictation along with smaller phrase technology. Any transcriptional errors that result from this process are unintentional.

## 2018-06-15 NOTE — Telephone Encounter (Signed)
I spoke with the patient.  She is currently admitted to r/u CVA.  She states when she got to the hospital her BP was "rock bottom" and they have decreased her imdur from 120 mg once daily to 30 mg once daily.  I advised if her BP was too low, then that makes sense, but I will forward to Dr. [name] to review.  She voices understanding and is agreeable.

## 2018-06-16 MED ORDER — CIPROFLOXACIN HCL 500 MG PO TABS: 500.0000 mg | ORAL_TABLET | Freq: Two times a day (BID) | ORAL | 0 refills | Status: AC

## 2018-06-16 MED ORDER — METOPROLOL SUCCINATE ER 25 MG PO TB24: 12.5000 mg | ORAL_TABLET | Freq: Every day | ORAL | 11 refills | Status: DC

## 2018-06-16 MED ORDER — METOPROLOL SUCCINATE ER 25 MG PO TB24: 25.0000 mg | ORAL_TABLET | Freq: Every day | ORAL | 11 refills | Status: DC

## 2018-06-16 MED ORDER — ISOSORBIDE MONONITRATE ER 30 MG PO TB24: 30.0000 mg | ORAL_TABLET | Freq: Every day | ORAL | 0 refills | Status: DC

## 2018-06-16 NOTE — Discharge Summary (Signed)
[name] [address] [name], is a 48 y.o. female  DOB Oct 25, 1970  MRN [id number].  Admission date:  06/14/2018  Admitting Physician  Christel Mormon, MD  Discharge Date:  06/16/2018   Primary MD  [name], MD  Recommendations for primary care physician for things to follow:   Follow with PCP 1 week  follow-up with primary cardiologist as scheduled.   Admission Diagnosis  Dehydration [E86.0] Slurred speech [R47.81] Acute cystitis without hematuria [N30.00] Acute encephalopathy [G93.40]   Discharge Diagnosis  Dehydration [E86.0] Slurred speech [R47.81] Acute cystitis without hematuria [N30.00] Acute encephalopathy [G93.40]    Active Problems:   Sepsis ([place])      Past Medical History:  Diagnosis Date  . Acute pyelonephritis   . Anemia    iron deficiency anemia  . Aortic arch aneurysm ([place])   . Bipolar disorder ([place])   . BRCA negative 2014  . CAD (coronary artery disease)    a. 08/2003 Cath: LAD 30-40-med Rx; b. 11/2014 PCI: LAD 414m(3.25x23 Xience Alpine DES); c. 06/2015 PCI: D1 (2.25x12 Resolute Integrity DES); d. 06/2017 PCI: Patent mLAD stent, D2 95 (PTCA); e. 09/2017 PCI: D2 99ost (CBA); d. 12/2017 Cath: LM nl, LAD 393m80d (small), D1 40ost, D2 95ost, LCX 40p, RCA 40ost/p->Med rx for D2 given restenosis.  . CKD (chronic kidney disease), stage III ([place]  . Colon polyp   . CVA (cerebral vascular accident) ([place]   Left side weakness.   . Diabetes ([place]  . Family history of breast cancer    BRCA neg 2014  . Gastric ulcer 04/27/2011  . History of echocardiogram    a. 03/2017 Echo: EF 60-65%, no rwma; b. 02/2018 Echo: EF 60-65%, no rwma. Nl RV fxn. No cardiac source of emboli (admitted w/ stroke).  . [place] (hypertension)   . Hyperlipemia   . Hypothyroid   . Malignant melanoma of skin of scalp ([facility]  . MI, acute,  non ST segment elevation ([place]  . Neuromuscular disorder ([place]  . Orthostatic hypotension   . S/P drug eluting coronary stent placement 06/04/2015  . Sepsis (HC[place]2/14/2019  . St[facility] ([facility]   a. 02/2018 MRI: 14m41mate acute/early subacute L medial frontal lobe inarct; b. 02/2018 MRA No large vessel occlusion or aneurysm. Mod to sev L P2 stenosis. thready L vertebral artery, diffusely dzs'd; c. 02/2018 Carotid U/S: <50% bilat ICA dzs.    Past Surgical History:  Procedure Laterality Date  . APPENDECTOMY    . CARDIAC CATHETERIZATION N/A 11/09/2014   Procedure: Coronary Angiography;  Surgeon: TimMinna MerrittsD;  [facility];  Service: Cardiovascular;  Laterality: N/A;  . CARDIAC CATHETERIZATION N/A 11/12/2014   Procedure: Coronary Stent Intervention;  Surgeon: AleIsaias CowmanD;  [facility];  Service: Cardiovascular;  Laterality: N/A;  . CARDIAC CATHETERIZATION N/A 04/18/2015   Procedure: Left Heart Cath and Coronary Angiography;  Surgeon: TimMinna MerrittsD;  [facility];  Service: Cardiovascular;  Laterality: N/A;  . CARDIAC CATHETERIZATION Left 06/04/2015   Procedure: Left Heart Cath and Coronary Angiography;  Surgeon: MuhWellington HampshireD;  [facility];  Service: Cardiovascular;  Laterality: Left;  . CARDIAC CATHETERIZATION N/A 06/04/2015   Procedure: Coronary Stent Intervention;  Surgeon: MuhWellington HampshireD;  [facility];  Service: Cardiovascular;  Laterality: N/A;  . CESAREAN SECTION  2001  . CHOLECYSTECTOMY N/A 11/18/2016   Procedure:  LAPAROSCOPIC CHOLECYSTECTOMY WITH INTRAOPERATIVE CHOLANGIOGRAM;  Surgeon: Christene Lye, MD;  [facility];  Service: General;  Laterality: N/A;  . COLONOSCOPY WITH PROPOFOL N/A 04/27/2016   Procedure: COLONOSCOPY WITH PROPOFOL;  Surgeon: [name], MD;  Location: [place];  Service: Endoscopy;  Laterality: N/A;  . COLONOSCOPY  WITH PROPOFOL N/A 01/12/2018   Procedure: COLONOSCOPY WITH PROPOFOL;  Surgeon: Toledo, Benay Pike, MD;  Location: [facility];  Service: Endoscopy;  Laterality: N/A;  . CORONARY ANGIOPLASTY    . CORONARY BALLOON ANGIOPLASTY N/A 06/29/2017   Procedure: CORONARY BALLOON ANGIOPLASTY;  Surgeon: Wellington Hampshire, MD;  Location: [place] [facility] LAB;  Service: Cardiovascular;  Laterality: N/A;  . CORONARY BALLOON ANGIOPLASTY N/A 09/20/2017   Procedure: CORONARY BALLOON ANGIOPLASTY;  Surgeon: Wellington Hampshire, MD;  Location: [name] [facility] LAB;  Service: Cardiovascular;  Laterality: N/A;  . DILATION AND CURETTAGE OF UTERUS    . ESOPHAGOGASTRODUODENOSCOPY (EGD) WITH PROPOFOL N/A 09/14/2014   Procedure: ESOPHAGOGASTRODUODENOSCOPY (EGD) WITH PROPOFOL;  Surgeon: Josefine Class, MD;  Location[facility];  Service: Endoscopy;  Laterality: N/A;  . ESOPHAGOGASTRODUODENOSCOPY (EGD) WITH PROPOFOL N/A 04/27/2016   Procedure: ESOPHAGOGASTRODUODENOSCOPY (EGD) WITH PROPOFOL;  Surgeon: [name], MD;  Location: [place];  Service: Endoscopy;  Laterality: N/A;  Diabetic - oral meds  . ESOPHAGOGASTRODUODENOSCOPY (EGD) WITH PROPOFOL N/A 01/12/2018   Procedure: ESOPHAGOGASTRODUODENOSCOPY (EGD) WITH PROPOFOL;  Surgeon: Toledo, Benay Pike, MD;  Location: [facility];  Service: Endoscopy;  Laterality: N/A;  . GASTRIC BYPASS  09/2009   [name]   . Left Carotid to sublcavian artery bypass w/ subclavian artery ligation     a. Performed @ [place].  . LEFT HEART CATH AND CORONARY ANGIOGRAPHY Left 06/29/2017   Procedure: LEFT HEART CATH AND CORONARY ANGIOGRAPHY;  Surgeon: Wellington Hampshire, MD;  Location: [place] [facility];  Service: Cardiovascular;  Laterality: Left;  . LEFT HEART CATH AND CORONARY ANGIOGRAPHY N/A 09/20/2017   Procedure: LEFT HEART CATH AND CORONARY ANGIOGRAPHY;  Surgeon: Wellington Hampshire, MD;  Location: [place] [facility] LAB;  Service: Cardiovascular;  Laterality:  N/A;  . LEFT HEART CATH AND CORONARY ANGIOGRAPHY N/A 12/20/2017   Procedure: LEFT HEART CATH AND CORONARY ANGIOGRAPHY;  Surgeon: Wellington Hampshire, MD;  Location: [place] [facility] LAB;  Service: Cardiovascular;  Laterality: N/A;  . MELANOMA EXCISION  2016   Dr. Evorn Gong  . [place]  2002  . RIGHT OOPHORECTOMY    . SHOULDER ARTHROSCOPY WITH OPEN ROTATOR CUFF REPAIR Right 01/07/2016   Procedure: SHOULDER ARTHROSCOPY WITH DEBRIDMENT, SUBACHROMIAL DECOMPRESSION;  Surgeon: Corky Mull, MD;  [facility];  Service: Orthopedics;  Laterality: Right;  . SHOULDER ARTHROSCOPY WITH OPEN ROTATOR CUFF REPAIR Right 03/16/2017   Procedure: SHOULDER ARTHROSCOPY WITH OPEN ROTATOR CUFF REPAIR POSSIBLE BICEPS TENODESIS;  Surgeon: Corky Mull, MD;  [facility];  Service: Orthopedics;  Laterality: Right;  . TRIGGER FINGER RELEASE Right     Middle Finger       History of present illness and  Hospital Course:     Kindly see H&P for history of present illness and admission details, please review complete Labs, Consult reports and Test reports for all details in brief  HPI  from the history and physical done on the day of admission 48 year old female patient admitted for dizziness, evaluation of CVA.   Hospital Course   1 dizziness, CVA ruled out by MRI of the brain, MRI of the brain, ultrasound of carotids all  this test were negative for acute stroke.  Patient found to have severe hypertension, blood pressure was 73/54 when she came.  The dizziness thought to be secondary to hypotension, mild UTI. 2.  Hypotension, improved with IV hydration, adjusting the blood pressure medication, patient Toprol-XL has been decreased to 12.5 daily, Imdur also decreased to 30 mg daily, patient's blood pressure is 147/66 today. #3, sepsis present on admission secondary to UTI; resolved ecurrent UTI, patient urine culture showed gram-negative rods, final sensitivities are pending.  Patient had history of  Klebsiella UTI in January 2020.  Discharging her home with 7-day supply of Cipro 500 mg p.o. twice daily . #3 diabetes mellitus type 2: Continue metformin 4.  History of previous strokes, history of 4 drug-eluting stent with PCI before, patient is on aspirin, Plavix, statins. #5 acute renal failure on CKD stage III stable, improved with IV fluids, CR trended down from 2.16-1.81. 6.  Bipolar disorder stable Physical therapy recommends home health physical therapy. Discharge Condition: Stable   Follow UP  Follow-up Information    [name], MD. Schedule an appointment as soon as possible for a visit in 1 week(s).   Specialty:  Family Medicine Contact information: [address] [place] [address] [phone]        Minna Merritts, MD .   Specialty:  Cardiology Contact information: [place] [address] [phone]             Discharge Instructions  and  Discharge Medications      Allergies as of 06/16/2018      Reactions   Lipitor [atorvastatin] Other (See Comments)   Leg pains   Tramadol Other (See Comments)   Mouth feels like it's on fire      Medication List    TAKE these medications   ALPRAZolam 0.5 MG tablet Commonly known as:  XANAX Take 0.5-1 tablets (0.25-0.5 mg total) by mouth 2 (two) times daily as needed for anxiety.   amLODipine 5 MG tablet Commonly known as:  NORVASC Take 1 tablet (5 mg total) by mouth 2 (two) times daily.   buPROPion 150 MG 24 hr tablet Commonly known as:  WELLBUTRIN XL TAKE 1 TABLET BY MOUTH DAILY   ciprofloxacin 500 MG tablet Commonly known as:  Cipro Take 1 tablet (500 mg total) by mouth 2 (two) times daily for 7 days.   citalopram 40 MG tablet Commonly known as:  CELEXA Take 1 tablet (40 mg total) by mouth daily.   clopidogrel 75 MG tablet Commonly known as:  PLAVIX Take 75 mg by mouth daily.   gabapentin 300 MG capsule Commonly known as:   NEURONTIN Take 1 capsule (300 mg total) by mouth 2 (two) times daily.   isosorbide mononitrate 30 MG 24 hr tablet Commonly known as:  IMDUR Take 1 tablet (30 mg total) by mouth daily. Start taking on:  Jun 17, 2018 What changed:    medication strength  how much to take   levothyroxine 25 MCG tablet Commonly known as:  SYNTHROID Take 1 tablet (25 mcg total) by mouth daily before breakfast.   metFORMIN 500 MG tablet Commonly known as:  GLUCOPHAGE Take 500 mg by mouth 1 day or 1 dose.   metoprolol succinate 25 MG 24 hr tablet Commonly known as:  Toprol XL Take 0.5 tablets (12.5 mg total) by mouth daily. What changed:  how much to take   nitroGLYCERIN 0.4 MG SL tablet Commonly known as:  NITROSTAT Place 1 tablet (0.4 mg total) under the tongue every 5 (five) minutes as needed for chest pain.   ondansetron 4 MG tablet Commonly known as:  ZOFRAN Take 1 tablet (4 mg total) by mouth every 8 (eight) hours as needed.   ONE TOUCH ULTRA 2 w/Device Kit Use to check blood sugar once a day. Dx. E11.9   pantoprazole 40 MG tablet Commonly known as:  PROTONIX Take 1 tablet (40 mg total) by mouth 2 (two) times daily.   rifaximin 200 MG tablet Commonly known as:  XIFAXAN Take 1 tablet (200 mg total) by mouth 3 (three) times daily.   rosuvastatin 40 MG tablet Commonly known as:  CRESTOR Take 1 tablet (40 mg total) by mouth daily.   tiZANidine 4 MG tablet Commonly known as:  ZANAFLEX Take 4 mg by mouth daily as needed for pain.         Diet and Activity recommendation: See Discharge Instructions above   Consults obtained -physical therapy   Major procedures and Radiology Reports - PLEASE review detailed and final reports for all details, in brief -      Ct Head Wo Contrast  Result Date: 06/14/2018 CLINICAL DATA:  Headache and dizziness today. EXAM: CT HEAD WITHOUT CONTRAST TECHNIQUE: Contiguous axial images were obtained from the base of the skull through the vertex  without intravenous contrast. COMPARISON:  Head CT scan 04/05/2018 and 12/23/2017. Brain MRI 02/17/2018. FINDINGS: Brain: No evidence of acute infarction, hemorrhage, hydrocephalus, extra-axial collection or mass lesion/mass effect. Age advanced chronic microvascular ischemic disease is unchanged in appearance. Vascular: No hyperdense vessel or unexpected calcification. Skull: Intact.  No focal lesion. Sinuses/Orbits: Negative. Other: None. IMPRESSION: No acute abnormality. Age advanced chronic microvascular ischemic disease is unchanged in appearance. Electronically Signed   By: Inge Rise M.D.   On: 06/14/2018 21:01   Mr Jodene Nam Head Wo Contrast  Result Date: 06/15/2018 CLINICAL DATA:  Slurred speech with generalized weakness. EXAM: MRI HEAD WITHOUT CONTRAST MRA HEAD WITHOUT CONTRAST TECHNIQUE: Multiplanar, multiecho pulse sequences of the brain and surrounding structures were obtained without intravenous contrast. Angiographic images of the head were obtained using MRA technique without contrast. COMPARISON:  Head CT 06/14/2018 FINDINGS: MRI HEAD FINDINGS BRAIN: There is no acute infarct, acute hemorrhage or mass effect. The midline structures are normal. Old bilateral small vessel infarcts of the corona radiata. Old subcortical infarct the left parietal lobe. Diffuse confluent hyperintense T2-weighted signal within the periventricular, deep and juxtacortical white matter, most commonly due to chronic ischemic microangiopathy. Hyperintense T2-weighted signal at the brainstem is unchanged. The CSF spaces are normal for age, with no hydrocephalus. Susceptibility-sensitive sequences show no chronic microhemorrhage or superficial siderosis. SKULL AND UPPER CERVICAL SPINE: The visualized skull base, calvarium, upper cervical spine and extracranial soft tissues are normal. SINUSES/ORBITS: No fluid levels or advanced mucosal thickening. No mastoid or middle ear effusion. The orbits are normal. MRA HEAD FINDINGS  POSTERIOR CIRCULATION: --Basilar artery: Normal. --Posterior cerebral arteries: Normal right PCA, partially supplied by the right P-comm. The left PCA severely stenotic at the distal P1 segment. Distal PCA is normal. --Superior cerebellar arteries: Normal. --Inferior cerebellar arteries: Normal anterior and posterior inferior cerebellar arteries. ANTERIOR CIRCULATION: --Intracranial internal carotid arteries: Normal. --Anterior cerebral arteries: Normal. Absent left A1 segment, normal variant patent anterior communicating artery. --Middle cerebral arteries: Normal. --Posterior communicating arteries: Present on the right, absent on the left. IMPRESSION: 1. No acute intracranial abnormality. 2. Severe stenosis of the left PCA distal P1 segment, unchanged. 3.  Severe chronic microvascular ischemic disease of the white matter with multiple old infarcts. Electronically Signed   By: Ulyses Jarred M.D.   On: 06/15/2018 03:30   Mr Brain Wo Contrast  Result Date: 06/15/2018 CLINICAL DATA:  Slurred speech with generalized weakness. EXAM: MRI HEAD WITHOUT CONTRAST MRA HEAD WITHOUT CONTRAST TECHNIQUE: Multiplanar, multiecho pulse sequences of the brain and surrounding structures were obtained without intravenous contrast. Angiographic images of the head were obtained using MRA technique without contrast. COMPARISON:  Head CT 06/14/2018 FINDINGS: MRI HEAD FINDINGS BRAIN: There is no acute infarct, acute hemorrhage or mass effect. The midline structures are normal. Old bilateral small vessel infarcts of the corona radiata. Old subcortical infarct the left parietal lobe. Diffuse confluent hyperintense T2-weighted signal within the periventricular, deep and juxtacortical white matter, most commonly due to chronic ischemic microangiopathy. Hyperintense T2-weighted signal at the brainstem is unchanged. The CSF spaces are normal for age, with no hydrocephalus. Susceptibility-sensitive sequences show no chronic microhemorrhage or  superficial siderosis. SKULL AND UPPER CERVICAL SPINE: The visualized skull base, calvarium, upper cervical spine and extracranial soft tissues are normal. SINUSES/ORBITS: No fluid levels or advanced mucosal thickening. No mastoid or middle ear effusion. The orbits are normal. MRA HEAD FINDINGS POSTERIOR CIRCULATION: --Basilar artery: Normal. --Posterior cerebral arteries: Normal right PCA, partially supplied by the right P-comm. The left PCA severely stenotic at the distal P1 segment. Distal PCA is normal. --Superior cerebellar arteries: Normal. --Inferior cerebellar arteries: Normal anterior and posterior inferior cerebellar arteries. ANTERIOR CIRCULATION: --Intracranial internal carotid arteries: Normal. --Anterior cerebral arteries: Normal. Absent left A1 segment, normal variant patent anterior communicating artery. --Middle cerebral arteries: Normal. --Posterior communicating arteries: Present on the right, absent on the left. IMPRESSION: 1. No acute intracranial abnormality. 2. Severe stenosis of the left PCA distal P1 segment, unchanged. 3. Severe chronic microvascular ischemic disease of the white matter with multiple old infarcts. Electronically Signed   By: Ulyses Jarred M.D.   On: 06/15/2018 03:30   US Carotid Bilateral (at [facility] Only)  Result Date: 06/15/2018 CLINICAL DATA:  Slurred speech EXAM: BILATERAL CAROTID DUPLEX ULTRASOUND TECHNIQUE: Pearline Cables scale imaging, color Doppler and duplex ultrasound were performed of bilateral carotid and vertebral arteries in the neck. COMPARISON:  02/18/2018 FINDINGS: Criteria: Quantification of carotid stenosis is based on velocity parameters that correlate the residual internal carotid diameter with NASCET-based stenosis levels, using the diameter of the distal internal carotid lumen as the denominator for stenosis measurement. The following velocity measurements were obtained: RIGHT ICA: 72/31 cm/sec CCA: 87/86 cm/sec SYSTOLIC ICA/CCA RATIO:  1.0 ECA: 65  cm/sec LEFT ICA: 97/32 cm/sec CCA: 767/20 cm/sec SYSTOLIC ICA/CCA RATIO:  0.7 ECA: 83 cm/sec RIGHT CAROTID ARTERY: Minor echogenic shadowing plaque formation. No hemodynamically significant right ICA stenosis, velocity elevation, or turbulent flow. Degree of narrowing less than 50%. RIGHT VERTEBRAL ARTERY:  Antegrade LEFT CAROTID ARTERY: Similar scattered minor echogenic plaque formation. No hemodynamically significant left ICA stenosis, velocity elevation, or turbulent flow. LEFT VERTEBRAL ARTERY:  Antegrade IMPRESSION: Minor carotid atherosclerosis. No hemodynamically significant ICA stenosis. Degree of narrowing less than 50% bilaterally by ultrasound criteria. Patent antegrade vertebral bilaterally Electronically Signed   By: Jerilynn Mages.  Shick M.D.   On: 06/15/2018 11:04   Dg Chest Portable 1 View  Result Date: 06/14/2018 CLINICAL DATA:  Dizziness and hypotension EXAM: PORTABLE CHEST 1 VIEW COMPARISON:  March 16, 2018 FINDINGS: No edema or consolidation. Heart is upper normal in size with pulmonary vascularity normal. No adenopathy. Surgical clips in the left paratracheal  region are stable. No pneumothorax. No bone lesions. IMPRESSION: No edema or consolidation.  Stable cardiac silhouette. Electronically Signed   By: Lowella Grip III M.D.   On: 06/14/2018 21:56    Micro Results     Recent Results (from the past 240 hour(s))  SARS Coronavirus 2 (CEPHEID - Performed in [place] hospital lab), Hosp Order     Status: None   Collection Time: 06/14/18  9:06 PM  Result Value Ref Range Status   SARS Coronavirus 2 NEGATIVE NEGATIVE Final    Comment: (NOTE) If result is NEGATIVE SARS-CoV-2 target nucleic acids are NOT DETECTED. The SARS-CoV-2 RNA is generally detectable in upper and lower  respiratory specimens during the acute phase of infection. The lowest  concentration of SARS-CoV-2 viral copies this assay can detect is 250  copies / mL. A negative result does not preclude SARS-CoV-2 infection   and should not be used as the sole basis for treatment or other  patient management decisions.  A negative result may occur with  improper specimen collection / handling, submission of specimen other  than nasopharyngeal swab, presence of viral mutation(s) within the  areas targeted by this assay, and inadequate number of viral copies  (<250 copies / mL). A negative result must be combined with clinical  observations, patient history, and epidemiological information. If result is POSITIVE SARS-CoV-2 target nucleic acids are DETECTED. The SARS-CoV-2 RNA is generally detectable in upper and lower  respiratory specimens dur ing the acute phase of infection.  Positive  results are indicative of active infection with SARS-CoV-2.  Clinical  correlation with patient history and other diagnostic information is  necessary to determine patient infection status.  Positive results do  not rule out bacterial infection or co-infection with other viruses. If result is PRESUMPTIVE POSTIVE SARS-CoV-2 nucleic acids MAY BE PRESENT.   A presumptive positive result was obtained on the submitted specimen  and confirmed on repeat testing.  While 2019 novel coronavirus  (SARS-CoV-2) nucleic acids may be present in the submitted sample  additional confirmatory testing may be necessary for epidemiological  and / or clinical management purposes  to differentiate between  SARS-CoV-2 and other Sarbecovirus currently known to infect humans.  If clinically indicated additional testing with an alternate test  methodology [phone]) is advised. The SARS-CoV-2 RNA is generally  detectable in upper and lower respiratory sp ecimens during the acute  phase of infection. The expected result is Negative. Fact Sheet for Patients:  StrictlyIdeas.no Fact Sheet for Healthcare Providers: BankingDealers.co.za This test is not yet approved or cleared by the [place] FDA  and has been authorized for detection and/or diagnosis of SARS-CoV-2 by FDA under an Emergency Use Authorization (EUA).  This EUA will remain in effect (meaning this test can be used) for the duration of the COVID-19 declaration under Section 564(b)(1) of the Act, 21 U.S.C. section 360bbb-3(b)(1), unless the authorization is terminated or revoked sooner. Performed at [facility], [address], [place], [place] [address]   Urine culture     Status: Abnormal (Preliminary result)   Collection Time: 06/14/18  9:21 PM  Result Value Ref Range Status   Specimen Description   Final    URINE, CLEAN CATCH Performed at [facility], [address], [place], [place] [address]    Special Requests   Final    Normal Performed at [facility], [address], [place], [place] [address]    Culture (A)  Final    >=100,000  COLONIES/mL KLEBSIELLA PNEUMONIAE SUSCEPTIBILITIES TO FOLLOW Performed at [facility], [place] [address], [place], [place] [address]    Report Status PENDING  Incomplete       Today   Subjective:   [name] today denies any dizziness and eager to go home, patient said that her son is going to pick her up.  Objective:   Blood pressure (!) 147/66, pulse 61, temperature 98 F (36.7 C), temperature source Oral, resp. rate 16, height '5\' 3"'  (1.6 m), weight 80.1 kg, SpO2 100 %.   Intake/Output Summary (Last 24 hours) at 06/16/2018 1027 Last data filed at 06/16/2018 0925 Gross per 24 hour  Intake 1144.86 ml  Output 1 ml  Net 1143.86 ml    Exam Awake Alert, Oriented x 3, No new F.N deficits, Normal affect Mucar[place]ones.AT,PERRAL Supple Neck,No JVD, No cervical lymphadenopathy appriciated.  Symmetrical Chest wall movement, Good air movement bilaterally, CTAB RRR,No Gallops,Rubs or new Murmurs, No Parasternal Heave +ve B.Sounds, Abd Soft, Non tender, No organomegaly appriciated, No rebound -guarding or rigidity. No Cyanosis, Clubbing or  edema, No new Rash or bruise  Data Review   CBC w Diff:  Lab Results  Component Value Date   WBC 7.6 06/15/2018   HGB 7.8 (L) 06/15/2018   HGB 12.7 11/15/2015   HCT 26.3 (L) 06/15/2018   HCT 38.2 11/15/2015   PLT 180 06/15/2018   PLT 276 11/15/2015   LYMPHOPCT 15 04/28/2018   LYMPHOPCT 6.1 12/30/2013   MONOPCT 6 04/28/2018   MONOPCT 1.1 12/30/2013   EOSPCT 3 04/28/2018   EOSPCT 0.1 12/30/2013   BASOPCT 1 04/28/2018   BASOPCT 0.2 12/30/2013    CMP:  Lab Results  Component Value Date   NA 137 06/15/2018   NA 143 07/09/2017   NA 136 12/30/2013   K 3.9 06/15/2018   K 4.8 12/30/2013   CL 108 06/15/2018   CL 107 12/30/2013   CO2 20 (L) 06/15/2018   CO2 23 12/30/2013   BUN 21 (H) 06/15/2018   BUN 20 07/09/2017   BUN 23 (H) 12/30/2013   CREATININE 1.81 (H) 06/15/2018   CREATININE 1.92 (H) 10/14/2016   GLU 189 02/03/2013   PROT 6.9 06/14/2018   PROT 7.1 11/15/2015   PROT 7.1 12/30/2013   ALBUMIN 3.7 06/14/2018   ALBUMIN 4.1 01/29/2016   ALBUMIN 3.5 12/30/2013   BILITOT 0.4 06/14/2018   BILITOT 0.3 11/15/2015   BILITOT 0.2 12/30/2013   ALKPHOS 115 06/14/2018   ALKPHOS 87 12/30/2013   AST 41 06/14/2018   AST 14 (L) 12/30/2013   ALT 46 (H) 06/14/2018   ALT 33 12/30/2013  .   Total Time in preparing paper work, data evaluation and todays exam - 35 minutes  Epifanio Lesches M.D on 06/16/2018 at 10:27 AM    Note: This dictation was prepared with Dragon dictation along with smaller phrase technology. Any transcriptional errors that result from this process are unintentional.

## 2018-06-16 NOTE — Plan of Care (Signed)
Pt is d/ced home.  All tests were negative for new stroke.  Pt has been A&O, no c/o pain and independent in the room.  She's had some low BP but think part of it was related to using a cuff that was too big.   Her HR was a little on the low side and after talking to Dr. Vianne Bulls, metoprolol dose was reduced.  Pt lives w/her son who will pick her up.  Will remove IV and review d/c instructions and f/u appts.

## 2018-06-16 NOTE — TOC Transition Note (Signed)
Transition of Care [facility]) - CM/SW Discharge Note   Patient Details  Name: [name] MRN: [id number] Date of Birth: 06/06/1970  Transition of Care [facility]) CM/SW Contact:  Annamaria Boots, [place] Phone Number: 06/16/2018, 10:18 AM   Clinical Narrative:   Patient is at high risk for readmission. CSW met with patient to discuss discharge plan. Patient reports that she lives with her adult son who provides transportation for her. Patient reports that she has had home health in the past with [place] and would like to continue services with them at discharge. Patient reports that she gets her medications from [facility] and her PCP is Dr. Caryn Section. Patient reports no DME needs at this time. CSW notified Corene Cornea with [facility] that patient would like to continue services with them and would discharge today. Patient will be transported by son.      Final next level of care: Regino Ramirez Barriers to Discharge: No Barriers Identified   Patient Goals and CMS Choice Patient states their goals for this hospitalization and ongoing recovery are:: Return home  CMS Medicare.gov Compare Post Acute Care list provided to:: Patient Choice offered to / list presented to : Patient  Discharge Placement                       Discharge Plan and Services     Post Acute Care Choice: Home Health                    HH Arranged: PT, OT [facility] Agency: [place] ([place]) Date [facility] Agency Contacted: 06/16/18 Time [place]: [place] Representative spoke with at [place]: [place] ([facility]) Interventions     Readmission Risk Interventions No flowsheet data found.

## 2018-06-17 DIAGNOSIS — I130 Hypertensive heart and chronic kidney disease with heart failure and stage 1 through stage 4 chronic kidney disease, or unspecified chronic kidney disease: Secondary | ICD-10-CM | POA: Diagnosis not present

## 2018-06-17 DIAGNOSIS — E1122 Type 2 diabetes mellitus with diabetic chronic kidney disease: Secondary | ICD-10-CM | POA: Diagnosis not present

## 2018-06-17 DIAGNOSIS — I69328 Other speech and language deficits following cerebral infarction: Secondary | ICD-10-CM | POA: Diagnosis not present

## 2018-06-17 DIAGNOSIS — Z7902 Long term (current) use of antithrombotics/antiplatelets: Secondary | ICD-10-CM | POA: Diagnosis not present

## 2018-06-17 DIAGNOSIS — I69354 Hemiplegia and hemiparesis following cerebral infarction affecting left non-dominant side: Secondary | ICD-10-CM | POA: Diagnosis not present

## 2018-06-17 DIAGNOSIS — Z9181 History of falling: Secondary | ICD-10-CM | POA: Diagnosis not present

## 2018-06-17 DIAGNOSIS — N3000 Acute cystitis without hematuria: Secondary | ICD-10-CM | POA: Diagnosis not present

## 2018-06-17 DIAGNOSIS — G43909 Migraine, unspecified, not intractable, without status migrainosus: Secondary | ICD-10-CM | POA: Diagnosis not present

## 2018-06-17 DIAGNOSIS — Z87891 Personal history of nicotine dependence: Secondary | ICD-10-CM | POA: Diagnosis not present

## 2018-06-17 DIAGNOSIS — E1140 Type 2 diabetes mellitus with diabetic neuropathy, unspecified: Secondary | ICD-10-CM | POA: Diagnosis not present

## 2018-06-17 DIAGNOSIS — R4781 Slurred speech: Secondary | ICD-10-CM | POA: Diagnosis not present

## 2018-06-17 DIAGNOSIS — Z9884 Bariatric surgery status: Secondary | ICD-10-CM | POA: Diagnosis not present

## 2018-06-17 DIAGNOSIS — I509 Heart failure, unspecified: Secondary | ICD-10-CM | POA: Diagnosis not present

## 2018-06-17 DIAGNOSIS — Z7984 Long term (current) use of oral hypoglycemic drugs: Secondary | ICD-10-CM | POA: Diagnosis not present

## 2018-06-17 DIAGNOSIS — N183 Chronic kidney disease, stage 3 (moderate): Secondary | ICD-10-CM | POA: Diagnosis not present

## 2018-06-17 DIAGNOSIS — R296 Repeated falls: Secondary | ICD-10-CM | POA: Diagnosis not present

## 2018-06-17 LAB — URINE CULTURE
Culture: >=100000 — AB
Special Requests: NORMAL

## 2018-06-19 NOTE — Telephone Encounter (Signed)
Yes low blood pressure can happen in setting of  "sepsis present on admission secondary to UTI; urine culture showed gram-negative rods" from d/c summary She should monitor BP as when infection resolves, BP may climb back up to normal

## 2018-06-20 ENCOUNTER — Ambulatory Visit (INDEPENDENT_AMBULATORY_CARE_PROVIDER_SITE_OTHER): Payer: Medicare Other | Admitting: Family Medicine

## 2018-06-20 ENCOUNTER — Other Ambulatory Visit: Payer: Self-pay

## 2018-06-20 VITALS — BP 105/71 | HR 80 | Temp 98.1°F | Wt 170.0 lb

## 2018-06-20 DIAGNOSIS — N390 Urinary tract infection, site not specified: Secondary | ICD-10-CM

## 2018-06-20 DIAGNOSIS — D538 Other specified nutritional anemias: Secondary | ICD-10-CM

## 2018-06-20 DIAGNOSIS — R6520 Severe sepsis without septic shock: Secondary | ICD-10-CM

## 2018-06-20 DIAGNOSIS — A419 Sepsis, unspecified organism: Secondary | ICD-10-CM | POA: Diagnosis not present

## 2018-06-20 DIAGNOSIS — N179 Acute kidney failure, unspecified: Secondary | ICD-10-CM

## 2018-06-20 DIAGNOSIS — I959 Hypotension, unspecified: Secondary | ICD-10-CM | POA: Diagnosis not present

## 2018-06-20 LAB — POCT URINALYSIS DIPSTICK
Bilirubin, UA: NEGATIVE
Blood, UA: NEGATIVE
Glucose, UA: NEGATIVE
Ketones, UA: NEGATIVE
Leukocytes, UA: NEGATIVE
Nitrite, UA: NEGATIVE
Protein, UA: POSITIVE — AB
Spec Grav, UA: <=1.005 — AB (ref 1.010–1.025)
Urobilinogen, UA: 0.2 E.U./dL
pH, UA: 6 (ref 5.0–8.0)

## 2018-06-20 NOTE — Progress Notes (Addendum)
Patient: [name] Female    DOB: 09/22/1970   48 y.o.   MRN: [id number] Visit Date: 06/20/2018  Today's Provider: [name], MD   Chief Complaint  Patient presents with  . Hospitalization Follow-up  . Blood Infection   Subjective:     HPI     Follow up Hospitalization  Patient was admitted to [facility] on 06/15/2018 and discharged on 06/16/2018. She was treated for Sepsis. Treatment for this included See note below. Telephone follow up was not done since it has been less than 3 business days since discharge.  She reports excellent compliance with treatment. She reports this condition is Improved.   Hospital Course   1 dizziness, CVA ruled out by MRI of the brain, MRI of the brain, ultrasound of carotids all this test were negative for acute stroke.  Patient found to have severe hypertension, blood pressure was 73/54 when she came.  The dizziness thought to be secondary to hypotension, mild UTI. 2.  Hypotension, improved with IV hydration, adjusting the blood pressure medication, patient Toprol-XL has been decreased to 12.5 daily, Imdur also decreased to 30 mg daily, patient's blood pressure is 147/66 today. #3, sepsis present on admission secondary to UTI; resolved ecurrent UTI, patient urine culture showed gram-negative rods, final sensitivities showed sensitivity to ciprofloxacin and was discharged home with 7-day supply of Cipro 500 mg p.o. twice daily . #3 diabetes mellitus type 2: Continue metformin 4.  History of previous strokes, history of 4 drug-eluting stent with PCI before, patient is on aspirin, Plavix, statins. #5 acute renal failure on CKD stage III stable, improved with IV fluids, CR trended down from 2.16-1.81. Was also noted to be anemic on presentation which worsened during hospitalizaztion, presumable due to dilution with IVF.  Hemoglobin & Hematocrit     Component Value Date/Time   HGB 7.8 (L) 06/15/2018 0457   HGB 12.7 11/15/2015 0856    HCT 26.3 (L) 06/15/2018 0457   HCT 38.2 11/15/2015 0856    ------------------------------------------------------------------------------------     Allergies  Allergen Reactions  . Lipitor [Atorvastatin] Other (See Comments)    Leg pains  . Tramadol Other (See Comments)    Mouth feels like it's on fire     Current Outpatient Medications:  .  ALPRAZolam (XANAX) 0.5 MG tablet, Take 0.5-1 tablets (0.25-0.5 mg total) by mouth 2 (two) times daily as needed for anxiety., Disp: 30 tablet, Rfl: 5 .  amLODipine (NORVASC) 5 MG tablet, Take 1 tablet (5 mg total) by mouth 2 (two) times daily., Disp: 180 tablet, Rfl: 3 .  buPROPion (WELLBUTRIN XL) 150 MG 24 hr tablet, TAKE 1 TABLET BY MOUTH DAILY, Disp: 30 tablet, Rfl: 11 .  ciprofloxacin (CIPRO) 500 MG tablet, Take 1 tablet (500 mg total) by mouth 2 (two) times daily for 7 days., Disp: 14 tablet, Rfl: 0 .  citalopram (CELEXA) 40 MG tablet, Take 1 tablet (40 mg total) by mouth daily., Disp: 30 tablet, Rfl: 5 .  clopidogrel (PLAVIX) 75 MG tablet, Take 75 mg by mouth daily., Disp: , Rfl:  .  gabapentin (NEURONTIN) 300 MG capsule, Take 1 capsule (300 mg total) by mouth 2 (two) times daily., Disp: 60 capsule, Rfl: 5 .  isosorbide mononitrate (IMDUR) 30 MG 24 hr tablet, Take 1 tablet (30 mg total) by mouth daily., Disp: 30 tablet, Rfl: 0 .  levothyroxine (SYNTHROID, LEVOTHROID) 25 MCG tablet, Take 1 tablet (25 mcg total) by mouth daily before breakfast., Disp: 30  tablet, Rfl: 12 .  metFORMIN (GLUCOPHAGE) 500 MG tablet, Take 500 mg by mouth 2 (two) times daily with a meal. , Disp: , Rfl:  .  metoprolol succinate (TOPROL XL) 25 MG 24 hr tablet, Take 0.5 tablets (12.5 mg total) by mouth daily., Disp: 30 tablet, Rfl: 11 .  nitroGLYCERIN (NITROSTAT) 0.4 MG SL tablet, Place 1 tablet (0.4 mg total) under the tongue every 5 (five) minutes as needed for chest pain., Disp: 25 tablet, Rfl: 2 .  ondansetron (ZOFRAN) 4 MG tablet, Take 1 tablet (4 mg total) by  mouth every 8 (eight) hours as needed., Disp: 20 tablet, Rfl: 0 .  pantoprazole (PROTONIX) 40 MG tablet, Take 1 tablet (40 mg total) by mouth 2 (two) times daily., Disp: 60 tablet, Rfl: 5 .  rifaximin (XIFAXAN) 200 MG tablet, Take 1 tablet (200 mg total) by mouth 3 (three) times daily., Disp: 42 tablet, Rfl: 0 .  tiZANidine (ZANAFLEX) 4 MG tablet, Take 4 mg by mouth daily as needed for pain., Disp: , Rfl:  .  Blood Glucose Monitoring Suppl (ONE TOUCH ULTRA 2) w/Device KIT, Use to check blood sugar once a day. Dx. E11.9, Disp: 1 each, Rfl: 0 .  rosuvastatin (CRESTOR) 40 MG tablet, Take 1 tablet (40 mg total) by mouth daily., Disp: 90 tablet, Rfl: 3  Review of Systems  Constitutional: Positive for appetite change and fatigue. Negative for activity change, chills, diaphoresis, fever and unexpected weight change.  Respiratory: Positive for shortness of breath. Negative for apnea, cough, choking, chest tightness, wheezing and stridor.   Cardiovascular: Negative.   Gastrointestinal: Positive for diarrhea (History of IBS.). Negative for abdominal distention, abdominal pain, anal bleeding, blood in stool, constipation, nausea, rectal pain and vomiting.  Genitourinary: Negative.   Neurological: Positive for dizziness. Negative for light-headedness and headaches.    Social History   Tobacco Use  . Smoking status: Former Smoker    Types: Cigarettes    Last attempt to quit: 08/31/1994    Years since quitting: 23.8  . Smokeless tobacco: Never Used  . Tobacco comment: quit 28 years ago  Substance Use Topics  . Alcohol use: No    Alcohol/week: 0.0 standard drinks      Objective:   BP 105/71 (BP Location: Right Arm, Patient Position: Sitting, Cuff Size: Normal)   Pulse 80   Temp 98.1 F (36.7 C) (Oral)   Wt 170 lb (77.1 kg)   BMI 30.11 kg/m  Vitals:   06/20/18 1012  BP: 105/71  Pulse: 80  Temp: 98.1 F (36.7 C)  TempSrc: Oral  Weight: 170 lb (77.1 kg)     Physical Exam   General  Appearance:    Alert, cooperative, no distress  Eyes:    PERRL, conjunctiva/corneas clear, EOM's intact       Lungs:     Clear to auscultation bilaterally, respirations unlabored  Heart:    Regular rate and rhythm  Neurologic:   Awake, alert, oriented x 3. No apparent focal neurological           defect.       Results for orders placed or performed in visit on 06/20/18  POCT urinalysis dipstick  Result Value Ref Range   Color, UA     Clarity, UA     Glucose, UA Negative Negative   Bilirubin, UA Negative    Ketones, UA Negative    Spec Grav, UA <=1.005 (A) 1.010 - 1.025   Blood, UA Negative    pH, UA  6.0 5.0 - 8.0   Protein, UA Positive (A) Negative   Urobilinogen, UA 0.2 0.2 or 1.0 E.U./dL   Nitrite, UA Negative    Leukocytes, UA Negative Negative   Appearance     Odor         Assessment & Plan    1. Urinary tract infection without hematuria, site unspecified Resolved, is to finish course of ciprofloxacin prescribed at discharge.  - POCT urinalysis dipstick  2. Acute kidney injury ([place]) Greatly improved with IV hydration.  - Comprehensive metabolic panel  3. Sepsis with acute renal failure without septic shock, due to unspecified organism, unspecified acute renal failure type ([place]) Secondary to UTI. Resolved.   4. Other specified nutritional anemias She does report history of iron deficiency anemia requiring iron transfusions in the past, likely exacerbated by IVF dilution. She does not tolerate oral iron, advised she could probably tolerating QOD regiment while waiting for follow up labs.  - CBC - Vitamin B12 - Folate - Retic - Ferritin  5. Hypotension Multe-factorial. Likely exacerbated by sepsis and has been much lower since starting beta-blocker for heart rate regulation. Will stop amlodipine for the time being. She is to check BP at home and restart amlodipine if it gets >103 systolic.    The entirety of the information documented in the History of Present  Illness, Review of Systems and Physical Exam were personally obtained by me. Portions of this information were initially documented by Ashley Royalty, CMA and reviewed by me for thoroughness and accuracy.   [name], MD  [facility]

## 2018-06-20 NOTE — Telephone Encounter (Signed)
Returned call to patient with information from Dr. [name]. D/c medications included decrease dose of Imdur 30 mg once daily. Metoprolol 25 mg daily.   Yesterday BP AM 99/59, HR 68 PM 112/60, HR 57  She did not take Imdur or metoprolol yesterday.  Pt has appt with PCP this am at 10:00.  F/u with Dr. [name] Thursday for e visit.   Advised pt to call for any further questions or concerns.

## 2018-06-20 NOTE — Patient Instructions (Signed)
.   Please review the attached list of medications and notify my office if there are any errors.   . Please bring all of your medications to every appointment so we can make sure that our medication list is the same as yours.   

## 2018-06-21 ENCOUNTER — Telehealth: Payer: Self-pay

## 2018-06-21 LAB — COMPREHENSIVE METABOLIC PANEL
ALT: 28 IU/L (ref 0–32)
AST: 29 IU/L (ref 0–40)
Albumin/Globulin Ratio: 1.6 (ref 1.2–2.2)
Albumin: 4 g/dL (ref 3.8–4.8)
Alkaline Phosphatase: 128 IU/L — ABNORMAL HIGH (ref 39–117)
BUN/Creatinine Ratio: 8 — ABNORMAL LOW (ref 9–23)
BUN: 18 mg/dL (ref 6–24)
Bilirubin Total: 0.3 mg/dL (ref 0.0–1.2)
CO2: 19 mmol/L — ABNORMAL LOW (ref 20–29)
Calcium: 8.8 mg/dL (ref 8.7–10.2)
Chloride: 92 mmol/L — ABNORMAL LOW (ref 96–106)
Creatinine, Ser: 2.32 mg/dL — ABNORMAL HIGH (ref 0.57–1.00)
GFR calc Af Amer: 28 mL/min/{1.73_m2} — ABNORMAL LOW (ref >59)
GFR calc non Af Amer: 24 mL/min/{1.73_m2} — ABNORMAL LOW (ref >59)
Globulin, Total: 2.5 g/dL (ref 1.5–4.5)
Glucose: 166 mg/dL — ABNORMAL HIGH (ref 65–99)
Potassium: 3.9 mmol/L (ref 3.5–5.2)
Sodium: 127 mmol/L — ABNORMAL LOW (ref 134–144)
Total Protein: 6.5 g/dL (ref 6.0–8.5)

## 2018-06-21 LAB — CBC
Hematocrit: 29.2 % — ABNORMAL LOW (ref 34.0–46.6)
Hemoglobin: 9 g/dL — ABNORMAL LOW (ref 11.1–15.9)
MCH: 21.1 pg — ABNORMAL LOW (ref 26.6–33.0)
MCHC: 30.8 g/dL — ABNORMAL LOW (ref 31.5–35.7)
MCV: 68 fL — ABNORMAL LOW (ref 79–97)
Platelets: 219 10*3/uL (ref 150–450)
RBC: 4.27 x10E6/uL (ref 3.77–5.28)
RDW: 15.6 % — ABNORMAL HIGH (ref 11.7–15.4)
WBC: 10.3 10*3/uL (ref 3.4–10.8)

## 2018-06-21 LAB — FERRITIN: Ferritin: 7 ng/mL — ABNORMAL LOW (ref 15–150)

## 2018-06-21 LAB — FOLATE: Folate: 5.9 ng/mL (ref >3.0)

## 2018-06-21 LAB — VITAMIN B12: Vitamin B-12: 701 pg/mL (ref 232–1245)

## 2018-06-21 LAB — RETICULOCYTES: Retic Ct Pct: 1 % (ref 0.6–2.6)

## 2018-06-21 NOTE — Telephone Encounter (Signed)
LMTCB 06/21/2018  Thanks,   -Trenda Corliss  

## 2018-06-21 NOTE — Progress Notes (Signed)
Virtual Visit via Video Note   This visit type was conducted due to national recommendations for restrictions regarding the COVID-19 Pandemic (e.g. social distancing) in an effort to limit this patient's exposure and mitigate transmission in our community.  Due to her co-morbid illnesses, this patient is at least at moderate risk for complications without adequate follow up.  This format is felt to be most appropriate for this patient at this time.  All issues noted in this document were discussed and addressed.  A limited physical exam was performed with this format.  Please refer to the patient's chart for her consent to telehealth for [facility].    Date:  06/21/2018   ID:  [id number] [place] 03-May-1970, MRN [id number]  Patient Location:  [address] [place] [address]   Provider location:   [facility], [place] office  PCP:  [name], MD  Cardiologist:  Patsy Baltimore  Chief Complaint: Near syncope  WebCam visit on today's discussion with [email or web address]   History of Present Illness:    [name] is a 48 y.o. female who presents via audio/video conferencing for a telehealth visit today.   The patient does not symptoms concerning for COVID-19 infection (fever, chills, cough, or new SHORTNESS OF BREATH).   Patient has a past medical history of smoking,  strokes, 2010 chest pain dating back to 2005  CAD, Previous LAD stent  normal LV function,   s/p gastric bypass surgery,  Baseline creatinine 1.5, previously taken off her diuretic Catheterization May 2017 with stent placed to her first diagonal, there was residual 50% distal LAD disease, 30% proximal, 40% proximal circumflex Who presents for routine followup of her coronary artery disease  Recent UTI, hypotension Hospital records reviewed with the patient in detail BP meds held Slurred speech ,Acute cystitis without hematuria,Acute encephalopathy , sepsis  PT came  out,  [facility] in bathtub, legs weak On imdur 30 and metoprolol 25 daily Norvasc held,   105/71 at home Monday Little dizzy, not bad Drinking some fluids  Not eating well,  Recent labs: sodium 127, CR 2.3  On previous visit, she had tripped.  hit concrete , diagnosed with a concussion.   ED on 03/16/18 for hypotension   blood pressures (72/52), dizziness, nausea, and syncope. She is unaware of the cause for the low blood pressure. She felt a better as soon as they gave her fluids in the ED.  No regular exercise program   Prior CV studies:   The following studies were reviewed today:  Previous cardiac catheterization results discussed with her in detail Cardiac catheterization September 20, 2017 Significant one-vessel coronary artery disease with patent stents in the mid LAD and first diagonal.   significant chronic disease in the distal LAD close to the apex. Severe restenosis  ostial second diagonal at the site of previous angioplasty in May 2019. Successful cutting balloon angioplasty of the ostial second diagonal without stent placement.    Past Medical History:  Diagnosis Date  . Acute pyelonephritis   . Anemia    iron deficiency anemia  . Aortic arch aneurysm ([place])   . Bipolar disorder ([place])   . BRCA negative 2014  . CAD (coronary artery disease)    a. 08/2003 Cath: LAD 30-40-med Rx; b. 11/2014 PCI: LAD 40m(3.25x23 Xience Alpine DES); c. 06/2015 PCI: D1 ((7.41O87Resolute Integrity DES); d. 06/2017 PCI: Patent mLAD stent, D2 95 (PTCA); e. 09/2017 PCI: D2 99ost (CBA);  d. 12/2017 Cath: LM nl, LAD 23m 80d (small), D1 40ost, D2 95ost, LCX 40p, RCA 40ost/p->Med rx for D2 given restenosis.  . CKD (chronic kidney disease), stage III ([place]   . Colon polyp   . CVA (cerebral vascular accident) ([place]    Left side weakness.   . Diabetes ([place]   . Family history of breast cancer    BRCA neg 2014  . Gastric ulcer 04/27/2011  . History of echocardiogram    a. 03/2017 Echo: EF 60-65%, no  rwma; b. 02/2018 Echo: EF 60-65%, no rwma. Nl RV fxn. No cardiac source of emboli (admitted w/ stroke).  .Marland KitchenHTN (hypertension)   . Hyperlipemia   . Hypothyroid   . Malignant melanoma of skin of scalp ([place]   . MI, acute, non ST segment elevation ([place]   . Neuromuscular disorder ([place]   . Orthostatic hypotension   . S/P drug eluting coronary stent placement 06/04/2015  . Sepsis ([place] 03/18/2017  . Stroke ([facility]    a. 02/2018 MRI: 798mlate acute/early subacute L medial frontal lobe inarct; b. 02/2018 MRA No large vessel occlusion or aneurysm. Mod to sev L P2 stenosis. thready L vertebral artery, diffusely dzs'd; c. 02/2018 Carotid U/S: <50% bilat ICA dzs.   Past Surgical History:  Procedure Laterality Date  . APPENDECTOMY    . CARDIAC CATHETERIZATION N/A 11/09/2014   Procedure: Coronary Angiography;  Surgeon: TiMinna MerrittsMD;  [facility];  Service: Cardiovascular;  Laterality: N/A;  . CARDIAC CATHETERIZATION N/A 11/12/2014   Procedure: Coronary Stent Intervention;  Surgeon: AlIsaias CowmanMD;  [place];  Service: Cardiovascular;  Laterality: N/A;  . CARDIAC CATHETERIZATION N/A 04/18/2015   Procedure: Left Heart Cath and Coronary Angiography;  Surgeon: TiMinna MerrittsMD;  [place];  Service: Cardiovascular;  Laterality: N/A;  . CARDIAC CATHETERIZATION Left 06/04/2015   Procedure: Left Heart Cath and Coronary Angiography;  Surgeon: MuWellington HampshireMD;  [facility];  Service: Cardiovascular;  Laterality: Left;  . CARDIAC CATHETERIZATION N/A 06/04/2015   Procedure: Coronary Stent Intervention;  Surgeon: MuWellington HampshireMD;  [place] [facility];  Service: Cardiovascular;  Laterality: N/A;  . CESAREAN SECTION  2001  . CHOLECYSTECTOMY N/A 11/18/2016   Procedure: LAPAROSCOPIC CHOLECYSTECTOMY WITH INTRAOPERATIVE CHOLANGIOGRAM;  Surgeon: SaChristene LyeMD;  [facility];  Service:  General;  Laterality: N/A;  . COLONOSCOPY WITH PROPOFOL N/A 04/27/2016   Procedure: COLONOSCOPY WITH PROPOFOL;  Surgeon: DaLucilla LameMD;  Location: [facility] Service: Endoscopy;  Laterality: N/A;  . COLONOSCOPY WITH PROPOFOL N/A 01/12/2018   Procedure: COLONOSCOPY WITH PROPOFOL;  Surgeon: Toledo, TeBenay PikeMD;  Location: [facility];  Service: Endoscopy;  Laterality: N/A;  . CORONARY ANGIOPLASTY    . CORONARY BALLOON ANGIOPLASTY N/A 06/29/2017   Procedure: CORONARY BALLOON ANGIOPLASTY;  Surgeon: ArWellington HampshireMD;  [facility]: [facility];  Service: Cardiovascular;  Laterality: N/A;  . CORONARY BALLOON ANGIOPLASTY N/A 09/20/2017   Procedure: CORONARY BALLOON ANGIOPLASTY;  Surgeon: ArWellington HampshireMD;  [place];  Service: Cardiovascular;  Laterality: N/A;  . DILATION AND CURETTAGE OF UTERUS    . ESOPHAGOGASTRODUODENOSCOPY (EGD) WITH PROPOFOL N/A 09/14/2014   Procedure: ESOPHAGOGASTRODUODENOSCOPY (EGD) WITH PROPOFOL;  Surgeon: MaJosefine ClassMD;  [facility];  Service: Endoscopy;  Laterality: N/A;  . ESOPHAGOGASTRODUODENOSCOPY (EGD) WITH PROPOFOL N/A 04/27/2016   Procedure: ESOPHAGOGASTRODUODENOSCOPY (EGD) WITH PROPOFOL;  Surgeon: [name], MD;  Location: [place];  Service: Endoscopy;  Laterality: N/A;  Diabetic - oral meds  . ESOPHAGOGASTRODUODENOSCOPY (EGD) WITH PROPOFOL N/A 01/12/2018   Procedure: ESOPHAGOGASTRODUODENOSCOPY (EGD) WITH PROPOFOL;  Surgeon: Toledo, Benay Pike, MD;  Location: [facility];  Service: Endoscopy;  Laterality: N/A;  . GASTRIC BYPASS  09/2009   [facility]   . Left Carotid to sublcavian artery bypass w/ subclavian artery ligation     a. Performed @ [facility].  . LEFT HEART CATH AND CORONARY ANGIOGRAPHY Left 06/29/2017   Procedure: LEFT HEART CATH AND CORONARY ANGIOGRAPHY;  Surgeon: Wellington Hampshire, MD;  Location: [facility] CV LAB;  Service: Cardiovascular;  Laterality:  Left;  . LEFT HEART CATH AND CORONARY ANGIOGRAPHY N/A 09/20/2017   Procedure: LEFT HEART CATH AND CORONARY ANGIOGRAPHY;  Surgeon: Wellington Hampshire, MD;  Location: [place] CV LAB;  Service: Cardiovascular;  Laterality: N/A;  . LEFT HEART CATH AND CORONARY ANGIOGRAPHY N/A 12/20/2017   Procedure: LEFT HEART CATH AND CORONARY ANGIOGRAPHY;  Surgeon: Wellington Hampshire, MD;  Location: [place] CV LAB;  Service: Cardiovascular;  Laterality: N/A;  . MELANOMA EXCISION  2016   Dr. Evorn Gong  . [place]  2002  . RIGHT OOPHORECTOMY    . SHOULDER ARTHROSCOPY WITH OPEN ROTATOR CUFF REPAIR Right 01/07/2016   Procedure: SHOULDER ARTHROSCOPY WITH DEBRIDMENT, SUBACHROMIAL DECOMPRESSION;  Surgeon: Corky Mull, MD;  Location: [facility];  Service: Orthopedics;  Laterality: Right;  . SHOULDER ARTHROSCOPY WITH OPEN ROTATOR CUFF REPAIR Right 03/16/2017   Procedure: SHOULDER ARTHROSCOPY WITH OPEN ROTATOR CUFF REPAIR POSSIBLE BICEPS TENODESIS;  Surgeon: Corky Mull, MD;  Location: [facility];  Service: Orthopedics;  Laterality: Right;  . TRIGGER FINGER RELEASE Right     [place]     No outpatient medications have been marked as taking for the 06/23/18 encounter (Appointment) with Minna Merritts, MD.     Allergies:   Lipitor [atorvastatin] and Tramadol   Social History   Tobacco Use  . Smoking status: Former Smoker    Types: Cigarettes    Last attempt to quit: 08/31/1994    Years since quitting: 23.8  . Smokeless tobacco: Never Used  . Tobacco comment: quit 28 years ago  Substance Use Topics  . Alcohol use: No    Alcohol/week: 0.0 standard drinks  . Drug use: No     Current Outpatient Medications on File Prior to Visit  Medication Sig Dispense Refill  . ALPRAZolam (XANAX) 0.5 MG tablet Take 0.5-1 tablets (0.25-0.5 mg total) by mouth 2 (two) times daily as needed for anxiety. 30 tablet 5  . Blood Glucose Monitoring Suppl (ONE TOUCH ULTRA 2) w/Device KIT Use to check blood sugar once a  day. Dx. E11.9 1 each 0  . buPROPion (WELLBUTRIN XL) 150 MG 24 hr tablet TAKE 1 TABLET BY MOUTH DAILY 30 tablet 11  . ciprofloxacin (CIPRO) 500 MG tablet Take 1 tablet (500 mg total) by mouth 2 (two) times daily for 7 days. 14 tablet 0  . citalopram (CELEXA) 40 MG tablet Take 1 tablet (40 mg total) by mouth daily. 30 tablet 5  . clopidogrel (PLAVIX) 75 MG tablet Take 75 mg by mouth daily.    Marland Kitchen gabapentin (NEURONTIN) 300 MG capsule Take 1 capsule (300 mg total) by mouth 2 (two) times daily. 60 capsule 5  . isosorbide mononitrate (IMDUR) 30 MG 24 hr tablet Take 1 tablet (30 mg total) by mouth daily. 30 tablet 0  .  levothyroxine (SYNTHROID, LEVOTHROID) 25 MCG tablet Take 1 tablet (25 mcg total) by mouth daily before breakfast. 30 tablet 12  . metFORMIN (GLUCOPHAGE) 500 MG tablet Take 500 mg by mouth 2 (two) times daily with a meal.     . metoprolol succinate (TOPROL XL) 25 MG 24 hr tablet Take 0.5 tablets (12.5 mg total) by mouth daily. 30 tablet 11  . nitroGLYCERIN (NITROSTAT) 0.4 MG SL tablet Place 1 tablet (0.4 mg total) under the tongue every 5 (five) minutes as needed for chest pain. 25 tablet 2  . ondansetron (ZOFRAN) 4 MG tablet Take 1 tablet (4 mg total) by mouth every 8 (eight) hours as needed. 20 tablet 0  . pantoprazole (PROTONIX) 40 MG tablet Take 1 tablet (40 mg total) by mouth 2 (two) times daily. 60 tablet 5  . rifaximin (XIFAXAN) 200 MG tablet Take 1 tablet (200 mg total) by mouth 3 (three) times daily. 42 tablet 0  . rosuvastatin (CRESTOR) 40 MG tablet Take 1 tablet (40 mg total) by mouth daily. 90 tablet 3  . tiZANidine (ZANAFLEX) 4 MG tablet Take 4 mg by mouth daily as needed for pain.     No current facility-administered medications on file prior to visit.      Family Hx: The patient's family history includes Anxiety disorder in her father and mother; Bipolar disorder in her mother; Breast cancer in an other family member; Breast cancer (age of onset: 24) in her maternal aunt  and maternal aunt; Colon cancer in her cousin and father; Depression in her father, mother, and sister; Diabetes in her father and sister; Heart disease in her mother; Heart disease (age of onset: 65) in her father; Hyperlipidemia in her mother and sister; Hypertension in her father, mother, sister, and sister; Kidney disease in her father and sister; Ovarian cancer in her cousin; Skin cancer in her father; Stroke in her father; Thyroid nodules in her sister. There is no history of Kidney cancer or Bladder Cancer.  ROS:   Please see the history of present illness.    Review of Systems  Constitutional: Negative.   Respiratory: Negative.   Cardiovascular: Negative.   Gastrointestinal: Negative.   Musculoskeletal: Negative.        Leg weakness  Neurological: Positive for dizziness.  Psychiatric/Behavioral: Negative.   All other systems reviewed and are negative.    Labs/Other Tests and Data Reviewed:    Recent Labs: 07/09/2017: Magnesium 1.6 06/15/2018: B Natriuretic Peptide 67.0; TSH 1.256 06/20/2018: ALT 28; BUN 18; Creatinine, Ser 2.32; Hemoglobin 9.0; Platelets 219; Potassium 3.9; Sodium 127   Recent Lipid Panel Lab Results  Component Value Date/Time   CHOL 80 06/15/2018 04:57 AM   CHOL 159 05/28/2016 12:12 PM   CHOL 197 12/30/2013 03:55 AM   TRIG 91 06/15/2018 04:57 AM   TRIG 261 (H) 12/30/2013 03:55 AM   HDL 31 (L) 06/15/2018 04:57 AM   HDL 34 (L) 05/28/2016 12:12 PM   HDL 28 (L) 12/30/2013 03:55 AM   CHOLHDL 2.6 06/15/2018 04:57 AM   LDLCALC 31 06/15/2018 04:57 AM   LDLCALC 103 (H) 12/08/2016 12:21 PM   LDLCALC 117 (H) 12/30/2013 03:55 AM   LDLDIRECT 83.3 01/26/2011 09:13 AM    Wt Readings from Last 3 Encounters:  06/20/18 170 lb (77.1 kg)  06/15/18 176 lb 9.6 oz (80.1 kg)  04/29/18 166 lb (75.3 kg)     Exam:    105/71, pulse 70 resp 16  Well nourished, well developed female in no acute  distress.  Constitutional:  oriented to person, place, and time. No distress.   Head: Normocephalic and atraumatic.  Eyes:  no discharge. No scleral icterus.  Neck: Normal range of motion. Neck supple.  Pulmonary/Chest: No audible wheezing, no distress, appears comfortable Musculoskeletal: Normal range of motion.  no  tenderness or deformity.  Neurological:   Coordination normal. Full exam not performed Skin:  No rash Psychiatric:  normal mood and affect. behavior is normal. Thought content normal.    ASSESSMENT & PLAN:    Hyponatremia Down to 127, does not feel well at home Unclear etiology, poor fluid intake, recent antibiotics for sepsis Recommended some high sodium foods , drink such as Gatorade G2, chicken soup She will do this this evening.  When she call us if she starts to feel worse Will likely need a repeat BMP end of next week  Atherosclerosis of native coronary artery of native heart with stable angina pectoris (HCC) No anginal symptoms, went over her medications with her On low-dose metoprolol and isosorbide  Near syncope Hypotension after UTI sepsis Blood pressure medications changed, Norvasc held, Imdur lower dose Still with borderline low pressures, encouraged food and fluid intake  chronic systolic CHF (congestive heart failure) (HCC) Appears euvolemic if not mildly dry Encourage fluids for now  CKD (chronic kidney disease), stage III (HCC) Creatinine up to 2, prerenal As above encouraged fluids  Essential hypertension Running low after UTI sepsis May need to hold Imdur for any further near syncope episodes She is working with PT  History of CVA (cerebrovascular accident) Stable  Ischemic cardiomyopathy Stressed the importance of aggressive diabetes control, taking her statin   COVID-19 Education: The signs and symptoms of COVID-19 were discussed with the patient and how to seek care for testing (follow up with PCP or arrange E-visit).  The importance of social distancing was discussed today.  Patient Risk:   After full  review of this patients clinical status, I feel that they are at least moderate risk at this time.  Time:   Today, I have spent 25 minutes with the patient with telehealth technology discussing .     Medication Adjustments/Labs and Tests Ordered: Current medicines are reviewed at length with the patient today.  Concerns regarding medicines are outlined above.   Tests Ordered: No tests ordered   Medication Changes: No change   We will decrease the metoprolol succinate down to 25 mg daily   Disposition: Follow-up in 6 months   Signed, Ida Rogue, MD  06/21/2018 5:50 PM    [facility] [address] [place] [address], [place], [place] [address]

## 2018-06-21 NOTE — Telephone Encounter (Signed)
-----   Message from [name], MD sent at 06/21/2018  8:08 AM EDT ----- Anemia is better, but iron levels are extremely low. Recommend she try taking OTC ferous sulfate 325mg  once every other day. Need to recheck cbc in 2 weeks. Not improving then will need to go back to hematology for iron infusions.  Kidney functions have declined a bit since getting out of hospital, be sure to drink plenty of fluids. Will need to recheck with other labs in two weeks.

## 2018-06-22 NOTE — Telephone Encounter (Signed)
LMTCB on home and mobile #

## 2018-06-23 ENCOUNTER — Other Ambulatory Visit: Payer: Self-pay

## 2018-06-23 ENCOUNTER — Telehealth (INDEPENDENT_AMBULATORY_CARE_PROVIDER_SITE_OTHER): Payer: Medicare Other | Admitting: Cardiovascular Disease

## 2018-06-23 DIAGNOSIS — E1140 Type 2 diabetes mellitus with diabetic neuropathy, unspecified: Secondary | ICD-10-CM | POA: Diagnosis not present

## 2018-06-23 DIAGNOSIS — I25118 Atherosclerotic heart disease of native coronary artery with other forms of angina pectoris: Secondary | ICD-10-CM | POA: Diagnosis not present

## 2018-06-23 DIAGNOSIS — I69354 Hemiplegia and hemiparesis following cerebral infarction affecting left non-dominant side: Secondary | ICD-10-CM | POA: Diagnosis not present

## 2018-06-23 DIAGNOSIS — I5022 Chronic systolic (congestive) heart failure: Secondary | ICD-10-CM

## 2018-06-23 DIAGNOSIS — I255 Ischemic cardiomyopathy: Secondary | ICD-10-CM | POA: Diagnosis not present

## 2018-06-23 DIAGNOSIS — R296 Repeated falls: Secondary | ICD-10-CM | POA: Diagnosis not present

## 2018-06-23 DIAGNOSIS — I69328 Other speech and language deficits following cerebral infarction: Secondary | ICD-10-CM | POA: Diagnosis not present

## 2018-06-23 DIAGNOSIS — E871 Hypo-osmolality and hyponatremia: Secondary | ICD-10-CM

## 2018-06-23 DIAGNOSIS — I10 Essential (primary) hypertension: Secondary | ICD-10-CM

## 2018-06-23 DIAGNOSIS — N3000 Acute cystitis without hematuria: Secondary | ICD-10-CM | POA: Diagnosis not present

## 2018-06-23 DIAGNOSIS — E1122 Type 2 diabetes mellitus with diabetic chronic kidney disease: Secondary | ICD-10-CM

## 2018-06-23 DIAGNOSIS — N183 Chronic kidney disease, stage 3 unspecified: Secondary | ICD-10-CM

## 2018-06-23 DIAGNOSIS — Z8673 Personal history of transient ischemic attack (TIA), and cerebral infarction without residual deficits: Secondary | ICD-10-CM | POA: Diagnosis not present

## 2018-06-23 DIAGNOSIS — R55 Syncope and collapse: Secondary | ICD-10-CM

## 2018-06-23 DIAGNOSIS — R4781 Slurred speech: Secondary | ICD-10-CM | POA: Diagnosis not present

## 2018-06-23 DIAGNOSIS — I130 Hypertensive heart and chronic kidney disease with heart failure and stage 1 through stage 4 chronic kidney disease, or unspecified chronic kidney disease: Secondary | ICD-10-CM | POA: Diagnosis not present

## 2018-06-23 NOTE — Patient Instructions (Addendum)
Medication Instructions:  No changes  If you need a refill on your cardiac medications before your next appointment, please call your pharmacy.    Lab work: Needs BMP in 2 weeks for hyponatremia, renal failure Encouraged her to increase her salt intake for the next few days Please go to the [facility] Entrance of the hospital and check in at the front desk. We will send the results through MyChart and will call if there are any recommendations.  If you have labs (blood work) drawn today and your tests are completely normal, you will receive your results only by: Marland Kitchen MyChart Message (if you have MyChart) OR . A paper copy in the mail If you have any lab test that is abnormal or we need to change your treatment, we will call you to review the results.   Testing/Procedures: No new testing needed   Follow-Up: At [facility], you and your health needs are our priority.  As part of our continuing mission to provide you with exceptional heart care, we have created designated Provider Care Teams.  These Care Teams include your primary Cardiologist (physician) and Advanced Practice Providers (APPs -  Physician Assistants and Nurse Practitioners) who all work together to provide you with the care you need, when you need it.  . You will need a follow up appointment in 6 months .   Please call our office 2 months in advance to schedule this appointment.    . Providers on your designated Care Team:   . Murray Hodgkins, NP . Christell Faith, PA-C . Marrianne Mood, PA-C  Any Other Special Instructions Will Be Listed Below (If Applicable).  For educational health videos Log in to : [email or web address] Or : [email or web address], password : [email or web address]

## 2018-06-24 ENCOUNTER — Other Ambulatory Visit: Payer: Self-pay | Admitting: Family Medicine

## 2018-06-24 DIAGNOSIS — N3000 Acute cystitis without hematuria: Secondary | ICD-10-CM | POA: Diagnosis not present

## 2018-06-24 DIAGNOSIS — R296 Repeated falls: Secondary | ICD-10-CM | POA: Diagnosis not present

## 2018-06-24 DIAGNOSIS — I69328 Other speech and language deficits following cerebral infarction: Secondary | ICD-10-CM | POA: Diagnosis not present

## 2018-06-24 DIAGNOSIS — R4781 Slurred speech: Secondary | ICD-10-CM | POA: Diagnosis not present

## 2018-06-24 DIAGNOSIS — E1140 Type 2 diabetes mellitus with diabetic neuropathy, unspecified: Secondary | ICD-10-CM | POA: Diagnosis not present

## 2018-06-24 DIAGNOSIS — I69354 Hemiplegia and hemiparesis following cerebral infarction affecting left non-dominant side: Secondary | ICD-10-CM | POA: Diagnosis not present

## 2018-06-25 ENCOUNTER — Other Ambulatory Visit: Payer: Self-pay

## 2018-06-25 ENCOUNTER — Encounter: Payer: Self-pay | Admitting: Emergency Medicine

## 2018-06-25 ENCOUNTER — Inpatient Hospital Stay
Admission: EM | Admit: 2018-06-25 | Discharge: 2018-06-28 | DRG: 315 | Disposition: A | Discharge status: Home Health | Payer: Medicare Other | Attending: Internal Medicine | Admitting: Internal Medicine

## 2018-06-25 ENCOUNTER — Emergency Department: Payer: Medicare Other

## 2018-06-25 DIAGNOSIS — E1122 Type 2 diabetes mellitus with diabetic chronic kidney disease: Secondary | ICD-10-CM | POA: Diagnosis present

## 2018-06-25 DIAGNOSIS — Z885 Allergy status to narcotic agent status: Secondary | ICD-10-CM

## 2018-06-25 DIAGNOSIS — I959 Hypotension, unspecified: Principal | ICD-10-CM | POA: Diagnosis present

## 2018-06-25 DIAGNOSIS — R918 Other nonspecific abnormal finding of lung field: Secondary | ICD-10-CM | POA: Diagnosis not present

## 2018-06-25 DIAGNOSIS — I130 Hypertensive heart and chronic kidney disease with heart failure and stage 1 through stage 4 chronic kidney disease, or unspecified chronic kidney disease: Secondary | ICD-10-CM | POA: Diagnosis not present

## 2018-06-25 DIAGNOSIS — I69354 Hemiplegia and hemiparesis following cerebral infarction affecting left non-dominant side: Secondary | ICD-10-CM

## 2018-06-25 DIAGNOSIS — N390 Urinary tract infection, site not specified: Secondary | ICD-10-CM | POA: Diagnosis present

## 2018-06-25 DIAGNOSIS — I5022 Chronic systolic (congestive) heart failure: Secondary | ICD-10-CM | POA: Diagnosis present

## 2018-06-25 DIAGNOSIS — Z818 Family history of other mental and behavioral disorders: Secondary | ICD-10-CM

## 2018-06-25 DIAGNOSIS — E871 Hypo-osmolality and hyponatremia: Secondary | ICD-10-CM | POA: Diagnosis present

## 2018-06-25 DIAGNOSIS — B952 Enterococcus as the cause of diseases classified elsewhere: Secondary | ICD-10-CM | POA: Diagnosis present

## 2018-06-25 DIAGNOSIS — Z85820 Personal history of malignant melanoma of skin: Secondary | ICD-10-CM

## 2018-06-25 DIAGNOSIS — R4182 Altered mental status, unspecified: Secondary | ICD-10-CM | POA: Diagnosis not present

## 2018-06-25 DIAGNOSIS — Z79890 Hormone replacement therapy (postmenopausal): Secondary | ICD-10-CM

## 2018-06-25 DIAGNOSIS — I160 Hypertensive urgency: Secondary | ICD-10-CM | POA: Diagnosis present

## 2018-06-25 DIAGNOSIS — Z8349 Family history of other endocrine, nutritional and metabolic diseases: Secondary | ICD-10-CM

## 2018-06-25 DIAGNOSIS — E1140 Type 2 diabetes mellitus with diabetic neuropathy, unspecified: Secondary | ICD-10-CM | POA: Diagnosis present

## 2018-06-25 DIAGNOSIS — Z833 Family history of diabetes mellitus: Secondary | ICD-10-CM

## 2018-06-25 DIAGNOSIS — Z823 Family history of stroke: Secondary | ICD-10-CM

## 2018-06-25 DIAGNOSIS — Z8249 Family history of ischemic heart disease and other diseases of the circulatory system: Secondary | ICD-10-CM

## 2018-06-25 DIAGNOSIS — Z20828 Contact with and (suspected) exposure to other viral communicable diseases: Secondary | ICD-10-CM | POA: Diagnosis not present

## 2018-06-25 DIAGNOSIS — I2510 Atherosclerotic heart disease of native coronary artery without angina pectoris: Secondary | ICD-10-CM | POA: Diagnosis present

## 2018-06-25 DIAGNOSIS — Z7984 Long term (current) use of oral hypoglycemic drugs: Secondary | ICD-10-CM

## 2018-06-25 DIAGNOSIS — I252 Old myocardial infarction: Secondary | ICD-10-CM

## 2018-06-25 DIAGNOSIS — F319 Bipolar disorder, unspecified: Secondary | ICD-10-CM | POA: Diagnosis present

## 2018-06-25 DIAGNOSIS — K219 Gastro-esophageal reflux disease without esophagitis: Secondary | ICD-10-CM | POA: Diagnosis present

## 2018-06-25 DIAGNOSIS — Z9884 Bariatric surgery status: Secondary | ICD-10-CM

## 2018-06-25 DIAGNOSIS — Z800 Family history of malignant neoplasm of digestive organs: Secondary | ICD-10-CM

## 2018-06-25 DIAGNOSIS — Z8041 Family history of malignant neoplasm of ovary: Secondary | ICD-10-CM

## 2018-06-25 DIAGNOSIS — E785 Hyperlipidemia, unspecified: Secondary | ICD-10-CM | POA: Diagnosis present

## 2018-06-25 DIAGNOSIS — Z87891 Personal history of nicotine dependence: Secondary | ICD-10-CM

## 2018-06-25 DIAGNOSIS — E039 Hypothyroidism, unspecified: Secondary | ICD-10-CM | POA: Diagnosis present

## 2018-06-25 DIAGNOSIS — Z808 Family history of malignant neoplasm of other organs or systems: Secondary | ICD-10-CM

## 2018-06-25 DIAGNOSIS — Z841 Family history of disorders of kidney and ureter: Secondary | ICD-10-CM

## 2018-06-25 DIAGNOSIS — N183 Chronic kidney disease, stage 3 (moderate): Secondary | ICD-10-CM | POA: Diagnosis present

## 2018-06-25 DIAGNOSIS — E11649 Type 2 diabetes mellitus with hypoglycemia without coma: Secondary | ICD-10-CM | POA: Diagnosis present

## 2018-06-25 DIAGNOSIS — Z955 Presence of coronary angioplasty implant and graft: Secondary | ICD-10-CM

## 2018-06-25 DIAGNOSIS — Z803 Family history of malignant neoplasm of breast: Secondary | ICD-10-CM

## 2018-06-25 DIAGNOSIS — Z7902 Long term (current) use of antithrombotics/antiplatelets: Secondary | ICD-10-CM

## 2018-06-25 DIAGNOSIS — D509 Iron deficiency anemia, unspecified: Secondary | ICD-10-CM | POA: Diagnosis present

## 2018-06-25 DIAGNOSIS — B961 Klebsiella pneumoniae [K. pneumoniae] as the cause of diseases classified elsewhere: Secondary | ICD-10-CM | POA: Diagnosis present

## 2018-06-25 DIAGNOSIS — G35 Multiple sclerosis: Secondary | ICD-10-CM | POA: Diagnosis present

## 2018-06-25 DIAGNOSIS — Z888 Allergy status to other drugs, medicaments and biological substances status: Secondary | ICD-10-CM

## 2018-06-25 LAB — CBC WITH DIFFERENTIAL/PLATELET
Abs Immature Granulocytes: 0.04 10*3/uL (ref 0.00–0.07)
Basophils Absolute: 0.1 10*3/uL (ref 0.0–0.1)
Basophils Relative: 1 %
Eosinophils Absolute: 0.5 10*3/uL (ref 0.0–0.5)
Eosinophils Relative: 3 %
HCT: 25.3 % — ABNORMAL LOW (ref 36.0–46.0)
Hemoglobin: 8.1 g/dL — ABNORMAL LOW (ref 12.0–15.0)
Immature Granulocytes: 0 %
Lymphocytes Relative: 16 %
Lymphs Abs: 2.1 10*3/uL (ref 0.7–4.0)
MCH: 20 pg — ABNORMAL LOW (ref 26.0–34.0)
MCHC: 32 g/dL (ref 30.0–36.0)
MCV: 62.6 fL — ABNORMAL LOW (ref 80.0–100.0)
Monocytes Absolute: 0.7 10*3/uL (ref 0.1–1.0)
Monocytes Relative: 5 %
Neutro Abs: 9.9 10*3/uL — ABNORMAL HIGH (ref 1.7–7.7)
Neutrophils Relative %: 75 %
Platelets: 191 10*3/uL (ref 150–400)
RBC: 4.04 MIL/uL (ref 3.87–5.11)
RDW: 15.7 % — ABNORMAL HIGH (ref 11.5–15.5)
WBC: 13.2 10*3/uL — ABNORMAL HIGH (ref 4.0–10.5)
nRBC: 0 % (ref 0.0–0.2)

## 2018-06-25 LAB — URINALYSIS, COMPLETE (UACMP) WITH MICROSCOPIC
Bilirubin Urine: NEGATIVE
Glucose, UA: NEGATIVE mg/dL
Hgb urine dipstick: NEGATIVE
Ketones, ur: NEGATIVE mg/dL
Leukocytes,Ua: NEGATIVE
Nitrite: NEGATIVE
Protein, ur: NEGATIVE mg/dL
Specific Gravity, Urine: 1.003 — ABNORMAL LOW (ref 1.005–1.030)
pH: 6 (ref 5.0–8.0)

## 2018-06-25 LAB — COMPREHENSIVE METABOLIC PANEL
ALT: 17 U/L (ref 0–44)
AST: 25 U/L (ref 15–41)
Albumin: 3.5 g/dL (ref 3.5–5.0)
Alkaline Phosphatase: 96 U/L (ref 38–126)
Anion gap: 10 (ref 5–15)
BUN: 17 mg/dL (ref 6–20)
CO2: 21 mmol/L — ABNORMAL LOW (ref 22–32)
Calcium: 8.1 mg/dL — ABNORMAL LOW (ref 8.9–10.3)
Chloride: 89 mmol/L — ABNORMAL LOW (ref 98–111)
Creatinine, Ser: 1.98 mg/dL — ABNORMAL HIGH (ref 0.44–1.00)
GFR calc Af Amer: 34 mL/min — ABNORMAL LOW (ref >60)
GFR calc non Af Amer: 29 mL/min — ABNORMAL LOW (ref >60)
Glucose, Bld: 99 mg/dL (ref 70–99)
Potassium: 3.9 mmol/L (ref 3.5–5.1)
Sodium: 120 mmol/L — ABNORMAL LOW (ref 135–145)
Total Bilirubin: 0.4 mg/dL (ref 0.3–1.2)
Total Protein: 6.5 g/dL (ref 6.5–8.1)

## 2018-06-25 LAB — SARS CORONAVIRUS 2 BY RT PCR (HOSPITAL ORDER, PERFORMED IN ~~LOC~~ HOSPITAL LAB): SARS Coronavirus 2: NEGATIVE

## 2018-06-25 LAB — TROPONIN I: Troponin I: <0.03 ng/mL (ref <0.03)

## 2018-06-25 LAB — LACTIC ACID, PLASMA: Lactic Acid, Venous: 1.5 mmol/L (ref 0.5–1.9)

## 2018-06-25 MED ORDER — SODIUM CHLORIDE 0.9 % IV SOLN
1000.0000 mL | Freq: Once | INTRAVENOUS | Status: AC
  Administered 2018-06-25: 1000 mL via INTRAVENOUS

## 2018-06-25 NOTE — ED Triage Notes (Signed)
Pt to ED with c/o of not feeling well for several weeks. Pt recently seen for same symptoms. Pt lethargic and falling asleep during triage. Pt wakes to voice and pain.

## 2018-06-25 NOTE — H&P (Signed)
[place] [facility] [place] NAME: [name]    MR#:  [id number]  DATE OF BIRTH:  [birth year]  DATE OF ADMISSION:  06/25/2018  PRIMARY CARE PHYSICIAN: [name], MD   REQUESTING/REFERRING PHYSICIAN Lavonia Drafts  CHIEF COMPLAINT:   Chief Complaint  Patient presents with  . Altered Mental Status    HISTORY OF PRESENT ILLNESS:  [name]  is a 48 y.o. female with an extensive medical history including CVA x4 with residual left-sided weakness, diabetes mellitus, history of MI, CKD, bipolar disorder, and a history of iron deficiency anemia.  She presents to the emergency room complaining of diffuse generalized weakness as well as generalized malaise and fatigue and dizziness.  Dizziness is made worse with standing.  Patient is hypotensive on arrival to the emergency room with blood pressure [place] 7/53 with heart rate 67.  Serum glucose on arrival was 99.  However, since her arrival this has decreased to 45 and patient has been started on hypoglycemic protocol.  She tells me her glucose was 62 upon waking in the a.m. however she ate a bowl of chicken noodle soup.  She did not recheck her glucose since that time.  She denies chest pain or shortness of breath.  She denies fevers or chills.  She denies abdominal pain.  She denies unilateral weakness.  Denies blurred vision or diplopia.  He denies headache.  She denies dysuria or frequency.  However she continues to notice dark and malodorous urine.  Urinalysis on arrival demonstrates positive bacteria with positive mucus and white blood cell clumps.   On review of her previous hospital admissions, patient was found to be iron deficient anemic.  She has received iron transfusions in the past however does not take iron supplements nor has she had a transfusion of iron within the last year.  Iron panel is pending  Labs reviewed demonstrating mild leukocytosis with WBC 10.2.  Sodium is 120.   Troponin is 0.03.  Lactic acid is 1.5.  Hemoglobin is 8.1 with hematocrit 25.3.  Patient denies hematemesis, hematochezia, or melena.  She denies hematuria.  Chest x-ray finds possible mild vascular congestion.  Patient is followed by Dr. [name] for history of CHF.  Echocardiogram in January 2020 demonstrated ejection fraction 60 to 65%.  She has been admitted to the hospitalist service for further management.   PAST MEDICAL HISTORY:   Past Medical History:  Diagnosis Date  . Acute pyelonephritis   . Anemia    iron deficiency anemia  . Aortic arch aneurysm ([place])   . Bipolar disorder ([place])   . BRCA negative 2014  . CAD (coronary artery disease)    a. 08/2003 Cath: LAD 30-40-med Rx; b. 11/2014 PCI: LAD 57m(3.25x23 Xience Alpine DES); c. 06/2015 PCI: D1 (2.25x12 Resolute Integrity DES); d. 06/2017 PCI: Patent mLAD stent, D2 95 (PTCA); e. 09/2017 PCI: D2 99ost (CBA); d. 12/2017 Cath: LM nl, LAD 364m80d (small), D1 40ost, D2 95ost, LCX 40p, RCA 40ost/p->Med rx for D2 given restenosis.  . CKD (chronic kidney disease), stage III ([facility]  . Colon polyp   . CVA (cerebral vascular accident) ([facility]   Left side weakness.   . Diabetes ([facility]  . Family history of breast cancer    BRCA neg 2014  . Gastric ulcer 04/27/2011  . History of echocardiogram    a. 03/2017 Echo: EF 60-65%, no rwma; b. 02/2018 Echo: EF 60-65%, no rwma. Nl RV  fxn. No cardiac source of emboli (admitted w/ stroke).  Marland Kitchen HTN (hypertension)   . Hyperlipemia   . Hypothyroid   . Malignant melanoma of skin of scalp ([place])   . MI, acute, non ST segment elevation ([place])   . Neuromuscular disorder ([place])   . Orthostatic hypotension   . S/P drug eluting coronary stent placement 06/04/2015  . Sepsis ([place]) 03/18/2017  . Stroke [facility])    a. 02/2018 MRI: 69m late acute/early subacute L medial frontal lobe inarct; b. 02/2018 MRA No large vessel occlusion or aneurysm. Mod to sev L P2 stenosis. thready L vertebral artery, diffusely dzs'd; c. 02/2018  Carotid U/S: <50% bilat ICA dzs.    PAST SURGICAL HISTORY:   Past Surgical History:  Procedure Laterality Date  . APPENDECTOMY    . CARDIAC CATHETERIZATION N/A 11/09/2014   Procedure: Coronary Angiography;  Surgeon: TMinna Merritts MD;  [facility];  Service: Cardiovascular;  Laterality: N/A;  . CARDIAC CATHETERIZATION N/A 11/12/2014   Procedure: Coronary Stent Intervention;  Surgeon: AIsaias Cowman MD;  [facility];  Service: Cardiovascular;  Laterality: N/A;  . CARDIAC CATHETERIZATION N/A 04/18/2015   Procedure: Left Heart Cath and Coronary Angiography;  Surgeon: TMinna Merritts MD;  [place];  Service: Cardiovascular;  Laterality: N/A;  . CARDIAC CATHETERIZATION Left 06/04/2015   Procedure: Left Heart Cath and Coronary Angiography;  Surgeon: MWellington Hampshire MD;  [facility];  Service: Cardiovascular;  Laterality: Left;  . CARDIAC CATHETERIZATION N/A 06/04/2015   Procedure: Coronary Stent Intervention;  Surgeon: MWellington Hampshire MD;  [place] [facility];  Service: Cardiovascular;  Laterality: N/A;  . CESAREAN SECTION  2001  . CHOLECYSTECTOMY N/A 11/18/2016   Procedure: LAPAROSCOPIC CHOLECYSTECTOMY WITH INTRAOPERATIVE CHOLANGIOGRAM;  Surgeon: SChristene Lye MD;  Location: [facility];  Service: General;  Laterality: N/A;  . COLONOSCOPY WITH PROPOFOL N/A 04/27/2016   Procedure: COLONOSCOPY WITH PROPOFOL;  Surgeon: DLucilla Lame MD;  [facility]  Service: Endoscopy;  Laterality: N/A;  . COLONOSCOPY WITH PROPOFOL N/A 01/12/2018   Procedure: COLONOSCOPY WITH PROPOFOL;  Surgeon: Tol[place], TBenay Pike MD;  Location: [facility]COPY;  Service: Endoscopy;  Laterality: N/A;  . CORONARY ANGIOPLASTY    . CORONARY BALLOON ANGIOPLASTY N/A 06/29/2017   Procedure: CORONARY BALLOON ANGIOPLASTY;  Surgeon: AWellington Hampshire MD;  [facility];  Service: Cardiovascular;   Laterality: N/A;  . CORONARY BALLOON ANGIOPLASTY N/A 09/20/2017   Procedure: CORONARY BALLOON ANGIOPLASTY;  Surgeon: AWellington Hampshire MD;  [facility];  Service: Cardiovascular;  Laterality: N/A;  . DILATION AND CURETTAGE OF UTERUS    . ESOPHAGOGASTRODUODENOSCOPY (EGD) WITH PROPOFOL N/A 09/14/2014   Procedure: ESOPHAGOGASTRODUODENOSCOPY (EGD) WITH PROPOFOL;  Surgeon: MJosefine Class MD;  [facility];  Service: Endoscopy;  Laterality: N/A;  . ESOPHAGOGASTRODUODENOSCOPY (EGD) WITH PROPOFOL N/A 04/27/2016   Procedure: ESOPHAGOGASTRODUODENOSCOPY (EGD) WITH PROPOFOL;  Surgeon: DLucilla Lame MD;  [place]  Service: Endoscopy;  Laterality: N/A;  Diabetic - oral meds  . ESOPHAGOGASTRODUODENOSCOPY (EGD) WITH PROPOFOL N/A 01/12/2018   Procedure: ESOPHAGOGASTRODUODENOSCOPY (EGD) WITH PROPOFOL;  Surgeon: Toledo, TBenay Pike MD;  [facility];  Service: Endoscopy;  Laterality: N/A;  . [facility]  [facility]  . Left Carotid to sublcavian artery bypass w/ subclavian artery ligation     a. [place]  . LEFT HEART CATH AND CORONARY ANGIOGRAPHY  Left 06/29/2017   Procedure: LEFT HEART CATH AND CORONARY ANGIOGRAPHY;  Surgeon: Wellington Hampshire, MD;  Location: [place] [facility] LAB;  Service: Cardiovascular;  Laterality: Left;  . LEFT HEART CATH AND CORONARY ANGIOGRAPHY N/A 09/20/2017   Procedure: LEFT HEART CATH AND CORONARY ANGIOGRAPHY;  Surgeon: Wellington Hampshire, MD;  Location: [place] [facility] LAB;  Service: Cardiovascular;  Laterality: N/A;  . LEFT HEART CATH AND CORONARY ANGIOGRAPHY N/A 12/20/2017   Procedure: LEFT HEART CATH AND CORONARY ANGIOGRAPHY;  Surgeon: Wellington Hampshire, MD;  Location: [place] CV LAB;  Service: Cardiovascular;  Laterality: N/A;  . MELANOMA EXCISION  2016   Dr. Evorn Gong  . [place]  2002  . RIGHT OOPHORECTOMY    . SHOULDER ARTHROSCOPY WITH OPEN ROTATOR CUFF  REPAIR Right 01/07/2016   Procedure: SHOULDER ARTHROSCOPY WITH DEBRIDMENT, SUBACHROMIAL DECOMPRESSION;  Surgeon: Corky Mull, MD;  Location: [facility];  Service: Orthopedics;  Laterality: Right;  . SHOULDER ARTHROSCOPY WITH OPEN ROTATOR CUFF REPAIR Right 03/16/2017   Procedure: SHOULDER ARTHROSCOPY WITH OPEN ROTATOR CUFF REPAIR POSSIBLE BICEPS TENODESIS;  Surgeon: Corky Mull, MD;  Location: [facility];  Service: Orthopedics;  Laterality: Right;  . TRIGGER FINGER RELEASE Right     [place]    SOCIAL HISTORY:   Social History   Tobacco Use  . Smoking status: Former Smoker    Types: Cigarettes    Last attempt to quit: 08/31/1994    Years since quitting: 23.8  . Smokeless tobacco: Never Used  . Tobacco comment: quit 28 years ago  Substance Use Topics  . Alcohol use: No    Alcohol/week: 0.0 standard drinks    FAMILY HISTORY:   Family History  Problem Relation Age of Onset  . Hypertension Mother   . Anxiety disorder Mother   . Depression Mother   . Bipolar disorder Mother   . Heart disease Mother        No details  . Hyperlipidemia Mother   . Kidney disease Father   . Heart disease Father 47  . Hypertension Father   . Diabetes Father   . Stroke Father   . Colon cancer Father        dx in his 31's  . Anxiety disorder Father   . Depression Father   . Skin cancer Father   . Kidney disease Sister   . Thyroid nodules Sister   . Hypertension Sister   . Hypertension Sister   . Diabetes Sister   . Hyperlipidemia Sister   . Depression Sister   . Breast cancer Maternal Aunt 71  . Breast cancer Maternal Aunt 38  . Ovarian cancer Cousin   . Colon cancer Cousin   . Breast cancer Other   . Kidney cancer Neg Hx   . Bladder Cancer Neg Hx     DRUG ALLERGIES:   Allergies  Allergen Reactions  . Lipitor [Atorvastatin] Other (See Comments)    Leg pains  . Tramadol Other (See Comments)    Mouth feels like it's on fire    REVIEW OF SYSTEMS:   Review of Systems   Constitutional: Positive for malaise/fatigue. Negative for chills, diaphoresis and fever.  HENT: Negative for congestion, sinus pain and sore throat.   Eyes: Negative for blurred vision and double vision.  Respiratory: Negative for cough.   Cardiovascular: Negative for chest pain, palpitations and leg swelling.  Gastrointestinal: Positive for nausea. Negative for abdominal pain, blood in stool, constipation, diarrhea, melena and vomiting.  Genitourinary: Negative for  dysuria, flank pain, frequency, hematuria and urgency.       Dark and malodorous urine  Musculoskeletal: Negative for myalgias.  Skin: Negative for itching and rash.  Neurological: Positive for dizziness (on standing) and weakness (general). Negative for focal weakness, loss of consciousness and headaches.  Psychiatric/Behavioral: Negative.       MEDICATIONS AT HOME:   Prior to Admission medications   Medication Sig Start Date End Date Taking? Authorizing Provider  ALPRAZolam Duanne Moron) 0.5 MG tablet Take 0.5-1 tablets (0.25-0.5 mg total) by mouth 2 (two) times daily as needed for anxiety. 05/27/18   [name], MD  Blood Glucose Monitoring Suppl (ONE TOUCH ULTRA 2) w/Device KIT Use to check blood sugar once a day. Dx. E11.9 03/18/17   [name], MD  buPROPion (WELLBUTRIN XL) 150 MG 24 hr tablet TAKE 1 TABLET BY MOUTH DAILY 08/10/17   [name], MD  citalopram (CELEXA) 40 MG tablet Take 1 tablet by mouth once daily 06/24/18   [name], MD  clopidogrel (PLAVIX) 75 MG tablet Take 75 mg by mouth daily.    [provider]  gabapentin (NEURONTIN) 300 MG capsule Take 1 capsule (300 mg total) by mouth 2 (two) times daily. 06/08/18   [name], MD  isosorbide mononitrate (IMDUR) 30 MG 24 hr tablet Take 1 tablet (30 mg total) by mouth daily. 06/17/18   Epifanio Lesches, MD  levothyroxine (SYNTHROID, LEVOTHROID) 25 MCG tablet Take 1 tablet (25 mcg total) by mouth daily before breakfast. 03/28/18    [name], MD  metFORMIN (GLUCOPHAGE) 500 MG tablet Take 500 mg by mouth 2 (two) times daily with a meal.     [provider]  metoprolol succinate (TOPROL XL) 25 MG 24 hr tablet Take 0.5 tablets (12.5 mg total) by mouth daily. 06/16/18 06/16/19  Epifanio Lesches, MD  nitroGLYCERIN (NITROSTAT) 0.4 MG SL tablet Place 1 tablet (0.4 mg total) under the tongue every 5 (five) minutes as needed for chest pain. 06/02/17   Minna Merritts, MD  ondansetron (ZOFRAN) 4 MG tablet Take 1 tablet (4 mg total) by mouth every 8 (eight) hours as needed. 04/29/18   Mar Daring, PA-C  pantoprazole (PROTONIX) 40 MG tablet Take 1 tablet (40 mg total) by mouth 2 (two) times daily. 03/02/18   [name], MD  rifaximin (XIFAXAN) 200 MG tablet Take 1 tablet (200 mg total) by mouth 3 (three) times daily. 06/01/18   Mar Daring, PA-C  rosuvastatin (CRESTOR) 40 MG tablet Take 1 tablet (40 mg total) by mouth daily. 03/01/18 06/14/18  Theora Gianotti, NP  tiZANidine (ZANAFLEX) 4 MG tablet Take 4 mg by mouth daily as needed for pain. 03/07/18   [provider]      VITAL SIGNS:  Blood pressure 115/68, pulse 60, temperature 98.2 F (36.8 C), resp. rate 10, height _0  (1.575 m), weight 72.6 kg, SpO2 99 %.  PHYSICAL EXAMINATION:  Physical Exam Constitutional:      General: She is not in acute distress.    Appearance: Normal appearance. She is not diaphoretic.  HENT:     Head: Normocephalic and atraumatic.     Right Ear: External ear normal.     Left Ear: External ear normal.     Nose: Nose normal.     Mouth/Throat:     Mouth: Mucous membranes are moist.     Pharynx: Oropharynx is clear.  Eyes:     General: No scleral icterus.  Extraocular Movements: Extraocular movements intact.     Conjunctiva/sclera: Conjunctivae normal.     Pupils: Pupils are equal, round, and reactive to light.  Neck:     Musculoskeletal: Normal range of motion and neck supple.   Cardiovascular:     Rate and Rhythm: Normal rate and regular rhythm.     Pulses: Normal pulses.     Heart sounds: Normal heart sounds. No murmur. No friction rub. No gallop.   Pulmonary:     Effort: Pulmonary effort is normal.     Breath sounds: Normal breath sounds. No wheezing, rhonchi or rales.  Abdominal:     General: Bowel sounds are normal. There is no distension.     Tenderness: There is no abdominal tenderness.  Musculoskeletal: Normal range of motion.        General: No swelling or tenderness.     Right lower leg: No edema.     Left lower leg: No edema.  Lymphadenopathy:     Cervical: No cervical adenopathy.  Skin:    General: Skin is warm and dry.     Capillary Refill: Capillary refill takes less than 2 seconds.     Coloration: Skin is not jaundiced.     Findings: No rash.  Neurological:     General: No focal deficit present.     Mental Status: She is alert and oriented to person, place, and time.     Motor: Weakness (general) present.  Psychiatric:        Behavior: Behavior normal.       LABORATORY PANEL:   CBC Recent Labs  Lab 06/25/18 2141  WBC 13.2*  HGB 8.1*  HCT 25.3*  PLT 191   ------------------------------------------------------------------------------------------------------------------  Chemistries  Recent Labs  Lab 06/25/18 2141  NA 120*  K 3.9  CL 89*  CO2 21*  GLUCOSE 99  BUN 17  CREATININE 1.98*  CALCIUM 8.1*  AST 25  ALT 17  ALKPHOS 96  BILITOT 0.4   ------------------------------------------------------------------------------------------------------------------  Cardiac Enzymes Recent Labs  Lab 06/25/18 2141  TROPONINI <0.03   ------------------------------------------------------------------------------------------------------------------  RADIOLOGY:  Dg Chest Port 1 View  Result Date: 06/25/2018 CLINICAL DATA:  Weakness EXAM: PORTABLE CHEST 1 VIEW COMPARISON:  06/14/2018 FINDINGS: The cardiac silhouette is  enlarged. There is some vascular congestion. There is no pneumothorax. Surgical staples project over the left upper mediastinum. A coronary artery stent is noted. No acute osseous abnormality. IMPRESSION: Cardiomegaly with low lung volumes. Mild vascular congestion is suspected. Electronically Signed   By: Constance Holster M.D.   On: 06/25/2018 21:51      IMPRESSION AND PLAN:   1. hypotension - Patient received 1 L normal saline fluid bolus on arrival.  --Given patient's extensive history and current hyponatremia with sodium of 120, will check cortisol level. - She has normal saline infusing through peripheral IV at 75 cc/h.  Patient will need gentle rehydration given her history of cardiac disease, CKD, and CHF. - Will repeat CBC and BMP in the a.m. - We will get chest x-ray in the a.m. as well - We will continue to monitor vital signs closely.  She will have telemetry monitoring. -Hold Imdur  2. hypoglycemia -Hypoglycemic protocol initiated - Hemoglobin A1c is 5.9.  We will continue to monitor closely.  Will hold metformin for now.  3. diabetes mellitus - We will treat with moderate sliding scale insulin.  However patient is hypoglycemic on arrival requiring hypoglycemic protocol therapy.  Will hold metformin.  4.  History of iron  deficiency anemia - Patient has received iron transfusions in the past.  However, none over the last year.  Iron panel is pending.  Patient will have iron transfusion if indicated.  5. UTI - Patient treated 2 weeks ago during hospitalization for UTI with culture positive for Klebsiella Bonnet on 06/17/2018 sensitive to Rocephin and Cipro.  Patient had completed a course of Cipro outpatient since hospitalization.  IV Rocephin has been initiated.  New culture is pending.  6.  Generalized weakness - Physical therapy consulted for supportive care  7.  GERD - PPI therapy initiated  8. hyperlipidemia - Statin therapy continued/  9. hyponatremia -  Cortisol level pending - Patient received 1 L normal saline in the emergency room and has normal saline infusing through peripheral IV at 75 cc/h for gentle rehydration given history of CHF.  DVT and PPI prophylaxis have been initiated  All the records are reviewed and case discussed with ED provider. The plan of care was discussed in details with the patient (and family). I answered all questions. The patient agreed to proceed with the above mentioned plan. Further management will depend upon hospital course.   CODE STATUS: Full code  TOTAL TIME TAKING CARE OF THIS PATIENT: 45 minutes.    [place] on 06/25/2018 at 11:27 PM  Pager - [phone]  After 6pm go to [email or web address] - Proofreader  [facility]  Office  [phone]  CC: Primary care physician; [name], MD   Note: This dictation was prepared with Dragon dictation along with smaller phrase technology. Any transcriptional errors that result from this process are unintentional.

## 2018-06-25 NOTE — ED Provider Notes (Signed)
[facility] Emergency Department Provider Note   ____________________________________________    I have reviewed the triage vital signs and the nursing notes.   HISTORY  Chief Complaint Altered Mental Status     HPI [name] is a 48 y.o. female who presents with complaints of diffuse weakness, dizziness, fatigue.  She denies fevers or chills.  She reports she has been feeling better since last admission to this hospital for urosepsis earlier in the month however today she started to feel quite fatigued and weak.  No nausea or vomiting or abdominal pain.  She reports he feels similar to how she felt last time when she had sepsis.  Past Medical History:  Diagnosis Date  . Acute pyelonephritis   . Anemia    iron deficiency anemia  . Aortic arch aneurysm ([place])   . Bipolar disorder ([place])   . BRCA negative 2014  . CAD (coronary artery disease)    a. 08/2003 Cath: LAD 30-40-med Rx; b. 11/2014 PCI: LAD 4m(3.25x23 Xience Alpine DES); c. 06/2015 PCI: D1 (2.25x12 Resolute Integrity DES); d. 06/2017 PCI: Patent mLAD stent, D2 95 (PTCA); e. 09/2017 PCI: D2 99ost (CBA); d. 12/2017 Cath: LM nl, LAD 383m80d (small), D1 40ost, D2 95ost, LCX 40p, RCA 40ost/p->Med rx for D2 given restenosis.  . CKD (chronic kidney disease), stage III (HCBonneau Beach  . Colon polyp   . CVA (cerebral vascular accident) (HCTulia   Left side weakness.   . Diabetes (HCSchellsburg  . Family history of breast cancer    BRCA neg 2014  . Gastric ulcer 04/27/2011  . History of echocardiogram    a. 03/2017 Echo: EF 60-65%, no rwma; b. 02/2018 Echo: EF 60-65%, no rwma. Nl RV fxn. No cardiac source of emboli (admitted w/ stroke).  . [place] Kitchen[place] (hypertension)   . Hyperlipemia   . Hypothyroid   . Malignant melanoma of skin of scalp (HCEveretts  . MI, acute, non ST segment elevation (HCPrentiss  . Neuromuscular disorder (HCWaiohinu  . Orthostatic hypotension   . S/P drug eluting coronary stent placement 06/04/2015  .  Sepsis (HCLa Grange2/14/2019  . Stroke ([facility]   a. 02/2018 MRI: 66m31mate acute/early subacute L medial frontal lobe inarct; b. 02/2018 MRA No large vessel occlusion or aneurysm. Mod to sev L P2 stenosis. thready L vertebral artery, diffusely dzs'd; c. 02/2018 Carotid U/S: <50% bilat ICA dzs.    Patient Active Problem List   Diagnosis Date Noted  . Autonomic neuropathy 03/24/2018  . Acute delirium 03/03/2018  . H/O gastric bypass 03/02/2018  . MI, acute, non ST segment elevation (HCCMontesano . Effort angina (HCCJunction City5/28/2019  . Unstable angina (HCCSan Diego5/23/2019  . Syncope 04/09/2017  . Insomnia 03/18/2017  . Ischemic cardiomyopathy   . Arthritis   . Anxiety   . Acute colitis 01/27/2017  . Hx of colonic polyps   . H/O medication noncompliance 12/14/2015  . Emesis   . Atherosclerosis of native coronary artery of native heart with stable angina pectoris (HCCParlier . Hypertensive heart disease   . CKD (chronic kidney disease), stage III (HCCGranite City5/04/2015  . Presence of drug coated stent in LAD coronary artery & now in D1 06/04/2015  . Colitis 06/03/2015  . Carotid stenosis 04/30/2015  . Type 2 diabetes mellitus with stage 3 chronic kidney disease, without long-term current use of insulin (HCCLincoln Village . Stable angina pectoris (HCCChatham3/15/2017  . Iron deficiency  anemia 03/22/2015  . Vitamin B12 deficiency 02/18/2015  . Misuse of medications for pain 02/18/2015  . Major depressive disorder, recurrent, severe with psychotic features ([place]) 02/15/2015  . Helicobacter pylori infection 11/23/2014  . Hemiparesis, left ([place]) 11/23/2014  . Benign neoplasm of colon 11/20/2014  . Malignant melanoma ([place]) 08/25/2014  . Chronic systolic CHF (congestive heart failure) ([place])   . Incomplete bladder emptying 07/12/2014  . Adult hypothyroidism 12/30/2013  . Aberrant subclavian artery 11/17/2013  . Multiple sclerosis ([place]) 11/02/2013  . History of CVA (cerebrovascular accident) 06/20/2013  . Headache, migraine 05/29/2013   . Hyperlipidemia   . GERD (gastroesophageal reflux disease)   . Neuropathy ([place]) 01/02/2011  . Stroke ([place]) 06/21/2008  . Essential hypertension 05/01/2008    Past Surgical History:  Procedure Laterality Date  . APPENDECTOMY    . CARDIAC CATHETERIZATION N/A 11/09/2014   Procedure: Coronary Angiography;  Surgeon: Minna Merritts, MD;  Location: [place] [facility];  Service: Cardiovascular;  Laterality: N/A;  . CARDIAC CATHETERIZATION N/A 11/12/2014   Procedure: Coronary Stent Intervention;  Surgeon: Isaias Cowman, MD;  Location: [place] [facility];  Service: Cardiovascular;  Laterality: N/A;  . CARDIAC CATHETERIZATION N/A 04/18/2015   Procedure: Left Heart Cath and Coronary Angiography;  Surgeon: Minna Merritts, MD;  Location: [place] [facility];  Service: Cardiovascular;  Laterality: N/A;  . CARDIAC CATHETERIZATION Left 06/04/2015   Procedure: Left Heart Cath and Coronary Angiography;  Surgeon: Wellington Hampshire, MD;  Location: [place] [facility];  Service: Cardiovascular;  Laterality: Left;  . CARDIAC CATHETERIZATION N/A 06/04/2015   Procedure: Coronary Stent Intervention;  Surgeon: Wellington Hampshire, MD;  Location: [place] [facility];  Service: Cardiovascular;  Laterality: N/A;  . CESAREAN SECTION  2001  . CHOLECYSTECTOMY N/A 11/18/2016   Procedure: LAPAROSCOPIC CHOLECYSTECTOMY WITH INTRAOPERATIVE CHOLANGIOGRAM;  Surgeon: Christene Lye, MD;  Location: [facility];  Service: General;  Laterality: N/A;  . COLONOSCOPY WITH PROPOFOL N/A 04/27/2016   Procedure: COLONOSCOPY WITH PROPOFOL;  Surgeon: [name], MD;  Location: [place];  Service: Endoscopy;  Laterality: N/A;  . COLONOSCOPY WITH PROPOFOL N/A 01/12/2018   Procedure: COLONOSCOPY WITH PROPOFOL;  Surgeon: Toledo, Benay Pike, MD;  Location: [facility];  Service: Endoscopy;  Laterality: N/A;  . CORONARY ANGIOPLASTY    . CORONARY BALLOON ANGIOPLASTY N/A 06/29/2017   Procedure: CORONARY BALLOON  ANGIOPLASTY;  Surgeon: Wellington Hampshire, MD;  Location: [place] [facility] LAB;  Service: Cardiovascular;  Laterality: N/A;  . CORONARY BALLOON ANGIOPLASTY N/A 09/20/2017   Procedure: CORONARY BALLOON ANGIOPLASTY;  Surgeon: Wellington Hampshire, MD;  Location: [place] [facility];  Service: Cardiovascular;  Laterality: N/A;  . DILATION AND CURETTAGE OF UTERUS    . ESOPHAGOGASTRODUODENOSCOPY (EGD) WITH PROPOFOL N/A 09/14/2014   Procedure: ESOPHAGOGASTRODUODENOSCOPY (EGD) WITH PROPOFOL;  Surgeon: Josefine Class, MD;  Location[facility];  Service: Endoscopy;  Laterality: N/A;  . ESOPHAGOGASTRODUODENOSCOPY (EGD) WITH PROPOFOL N/A 04/27/2016   Procedure: ESOPHAGOGASTRODUODENOSCOPY (EGD) WITH PROPOFOL;  Surgeon: [name], MD;  Location: [place];  Service: Endoscopy;  Laterality: N/A;  Diabetic - oral meds  . ESOPHAGOGASTRODUODENOSCOPY (EGD) WITH PROPOFOL N/A 01/12/2018   Procedure: ESOPHAGOGASTRODUODENOSCOPY (EGD) WITH PROPOFOL;  Surgeon: Tol[place], Benay Pike, MD;  Location: [facility];  Service: Endoscopy;  Laterality: N/A;  . GASTRIC BYPASS  09/2009   [facility]   . Left Carotid to sublcavian artery bypass w/ subclavian artery ligation     a. Performed @ [place].  . LEFT  HEART CATH AND CORONARY ANGIOGRAPHY Left 06/29/2017   Procedure: LEFT HEART CATH AND CORONARY ANGIOGRAPHY;  Surgeon: Wellington Hampshire, MD;  Location: [place] [facility];  Service: Cardiovascular;  Laterality: Left;  . LEFT HEART CATH AND CORONARY ANGIOGRAPHY N/A 09/20/2017   Procedure: LEFT HEART CATH AND CORONARY ANGIOGRAPHY;  Surgeon: Wellington Hampshire, MD;  Location: [place] CV LAB;  Service: Cardiovascular;  Laterality: N/A;  . LEFT HEART CATH AND CORONARY ANGIOGRAPHY N/A 12/20/2017   Procedure: LEFT HEART CATH AND CORONARY ANGIOGRAPHY;  Surgeon: Wellington Hampshire, MD;  Location: [place] [facility] LAB;  Service: Cardiovascular;  Laterality: N/A;  . MELANOMA EXCISION  2016   Dr. Evorn Gong   . [place]  2002  . RIGHT OOPHORECTOMY    . SHOULDER ARTHROSCOPY WITH OPEN ROTATOR CUFF REPAIR Right 01/07/2016   Procedure: SHOULDER ARTHROSCOPY WITH DEBRIDMENT, SUBACHROMIAL DECOMPRESSION;  Surgeon: Corky Mull, MD;  Location: [facility];  Service: Orthopedics;  Laterality: Right;  . SHOULDER ARTHROSCOPY WITH OPEN ROTATOR CUFF REPAIR Right 03/16/2017   Procedure: SHOULDER ARTHROSCOPY WITH OPEN ROTATOR CUFF REPAIR POSSIBLE BICEPS TENODESIS;  Surgeon: Corky Mull, MD;  Location: [facility];  Service: Orthopedics;  Laterality: Right;  . TRIGGER FINGER RELEASE Right     [place]    Prior to Admission medications   Medication Sig Start Date End Date Taking? Authorizing Provider  ALPRAZolam Duanne Moron) 0.5 MG tablet Take 0.5-1 tablets (0.25-0.5 mg total) by mouth 2 (two) times daily as needed for anxiety. 05/27/18   [name], MD  Blood Glucose Monitoring Suppl (ONE TOUCH ULTRA 2) w/Device KIT Use to check blood sugar once a day. Dx. E11.9 03/18/17   [name], MD  buPROPion (WELLBUTRIN XL) 150 MG 24 hr tablet TAKE 1 TABLET BY MOUTH DAILY 08/10/17   [name], MD  citalopram (CELEXA) 40 MG tablet Take 1 tablet by mouth once daily 06/24/18   [name], MD  clopidogrel (PLAVIX) 75 MG tablet Take 75 mg by mouth daily.    [provider]  gabapentin (NEURONTIN) 300 MG capsule Take 1 capsule (300 mg total) by mouth 2 (two) times daily. 06/08/18   [name], MD  isosorbide mononitrate (IMDUR) 30 MG 24 hr tablet Take 1 tablet (30 mg total) by mouth daily. 06/17/18   Epifanio Lesches, MD  levothyroxine (SYNTHROID, LEVOTHROID) 25 MCG tablet Take 1 tablet (25 mcg total) by mouth daily before breakfast. 03/28/18   [name], MD  metFORMIN (GLUCOPHAGE) 500 MG tablet Take 500 mg by mouth 2 (two) times daily with a meal.     [provider]  metoprolol succinate (TOPROL XL) 25 MG 24 hr tablet Take 0.5 tablets (12.5 mg total) by mouth daily. 06/16/18  06/16/19  Epifanio Lesches, MD  nitroGLYCERIN (NITROSTAT) 0.4 MG SL tablet Place 1 tablet (0.4 mg total) under the tongue every 5 (five) minutes as needed for chest pain. 06/02/17   Minna Merritts, MD  ondansetron (ZOFRAN) 4 MG tablet Take 1 tablet (4 mg total) by mouth every 8 (eight) hours as needed. 04/29/18   Mar Daring, PA-C  pantoprazole (PROTONIX) 40 MG tablet Take 1 tablet (40 mg total) by mouth 2 (two) times daily. 03/02/18   [name], MD  rifaximin (XIFAXAN) 200 MG tablet Take 1 tablet (200 mg total) by mouth 3 (three) times daily. 06/01/18   Mar Daring, PA-C  rosuvastatin (CRESTOR) 40 MG tablet Take 1 tablet (40 mg total)  by mouth daily. 03/01/18 06/14/18  Theora Gianotti, NP  tiZANidine (ZANAFLEX) 4 MG tablet Take 4 mg by mouth daily as needed for pain. 03/07/18   [provider]     Allergies Lipitor [atorvastatin] and Tramadol  Family History  Problem Relation Age of Onset  . Hypertension Mother   . Anxiety disorder Mother   . Depression Mother   . Bipolar disorder Mother   . Heart disease Mother        No details  . Hyperlipidemia Mother   . Kidney disease Father   . Heart disease Father 37  . Hypertension Father   . Diabetes Father   . Stroke Father   . Colon cancer Father        dx in his 60's  . Anxiety disorder Father   . Depression Father   . Skin cancer Father   . Kidney disease Sister   . Thyroid nodules Sister   . Hypertension Sister   . Hypertension Sister   . Diabetes Sister   . Hyperlipidemia Sister   . Depression Sister   . Breast cancer Maternal Aunt 82  . Breast cancer Maternal Aunt 71  . Ovarian cancer Cousin   . Colon cancer Cousin   . Breast cancer Other   . Kidney cancer Neg Hx   . Bladder Cancer Neg Hx     Social History Social History   Tobacco Use  . Smoking status: Former Smoker    Types: Cigarettes    Last attempt to quit: 08/31/1994    Years since quitting: 23.8  . Smokeless  tobacco: Never Used  . Tobacco comment: quit 28 years ago  Substance Use Topics  . Alcohol use: No    Alcohol/week: 0.0 standard drinks  . Drug use: No    Review of Systems  Constitutional: No fever/chills Eyes: No visual changes.  ENT: No sore throat. Cardiovascular: Denies chest pain. Respiratory: Denies shortness of breath. Gastrointestinal: As above Genitourinary: Negative for dysuria. Musculoskeletal: Negative for back pain. Skin: Negative for rash. Neurological: Negative for headaches   ____________________________________________   PHYSICAL EXAM:  VITAL SIGNS: ED Triage Vitals  Enc Vitals Group     BP 06/25/18 2116 (!) 77/53     Pulse Rate 06/25/18 2116 67     Resp 06/25/18 2116 20     Temp 06/25/18 2116 98.2 F (36.8 C)     Temp src --      SpO2 06/25/18 2116 96 %     Weight 06/25/18 2116 72.6 kg (160 lb)     Height 06/25/18 2116 1.575 m ('5\' 2"' )     Head Circumference --      Peak Flow --      Pain Score 06/25/18 2124 0     Pain Loc --      Pain Edu? --      Excl. in [place]? --     Constitutional: Alert and oriented. Eyes: Conjunctivae are normal.   Nose: No congestion/rhinnorhea. Mouth/Throat: Mucous membranes are moist.    Cardiovascular: Normal rate, regular rhythm. Grossly normal heart sounds.  Good peripheral circulation. Respiratory: Normal respiratory effort.  No retractions. Lungs CTAB. Gastrointestinal: Soft and nontender. No distention.  No CVA tenderness.  Musculoskeletal: Warm and well perfused Neurologic:  Normal speech and language. No gross focal neurologic deficits are appreciated.  Skin:  Skin is warm, dry and intact. No rash noted. Psychiatric: Mood and affect are normal. Speech and behavior are normal.  ____________________________________________  LABS (all labs ordered are listed, but only abnormal results are displayed)  Labs Reviewed  CBC WITH DIFFERENTIAL/PLATELET - Abnormal; Notable for the following components:       Result Value   WBC 13.2 (*)    Hemoglobin 8.1 (*)    HCT 25.3 (*)    MCV 62.6 (*)    MCH 20.0 (*)    RDW 15.7 (*)    Neutro Abs 9.9 (*)    All other components within normal limits  COMPREHENSIVE METABOLIC PANEL - Abnormal; Notable for the following components:   Sodium 120 (*)    Chloride 89 (*)    CO2 21 (*)    Creatinine, Ser 1.98 (*)    Calcium 8.1 (*)    GFR calc non Af Amer 29 (*)    GFR calc Af Amer 34 (*)    All other components within normal limits  URINALYSIS, COMPLETE (UACMP) WITH MICROSCOPIC - Abnormal; Notable for the following components:   Color, Urine STRAW (*)    APPearance HAZY (*)    Specific Gravity, Urine 1.003 (*)    Bacteria, UA MANY (*)    All other components within normal limits  SARS CORONAVIRUS 2 (HOSPITAL ORDER, [place] LAB)  CULTURE, BLOOD (ROUTINE X 2)  CULTURE, BLOOD (ROUTINE X 2)  URINE CULTURE  TROPONIN I  LACTIC ACID, PLASMA   ____________________________________________  EKG  ED ECG REPORT I, Lavonia Drafts, the attending physician, personally viewed and interpreted this ECG.  Date: 06/25/2018  Rhythm: normal sinus rhythm QRS Axis: normal Intervals: normal ST/T Wave abnormalities: normal Narrative Interpretation: no evidence of acute ischemia  ____________________________________________  RADIOLOGY  X-ray unremarkable ____________________________________________   PROCEDURES  Procedure(s) performed: No  Procedures   Critical Care performed: yes  CRITICAL CARE Performed by: Lavonia Drafts   Total critical care time: 30 minutes  Critical care time was exclusive of separately billable procedures and treating other patients.  Critical care was necessary to treat or prevent imminent or life-threatening deterioration.  Critical care was time spent personally by me on the following activities: development of treatment plan with patient and/or surrogate as well as nursing, discussions with  consultants, evaluation of patient's response to treatment, examination of patient, obtaining history from patient or surrogate, ordering and performing treatments and interventions, ordering and review of laboratory studies, ordering and review of radiographic studies, pulse oximetry and re-evaluation of patient's condition.  ____________________________________________   INITIAL IMPRESSION / ASSESSMENT AND PLAN / ED COURSE  Pertinent labs & imaging results that were available during my care of the patient were reviewed by me and considered in my medical decision making (see chart for details).  Patient presents with hypotension, weakness, fatigue, dizziness.  She is afebrile.  Blood pressure improved with IV fluids quite rapidly.  Lab work is significant for sodium of 120, I suspect this is the cause of her weakness and fatigue.  Chest x-ray is unremarkable, urinalysis negative for nitrites or leuks, no evidence of sepsis, normal lactic.  She will require admission for sodium correction and volume resuscitation.    ____________________________________________   FINAL CLINICAL IMPRESSION(S) / ED DIAGNOSES  Final diagnoses:  Hypotension, unspecified hypotension type  Hyponatremia        Note:  This document was prepared using Dragon voice recognition software and may include unintentional dictation errors.   Lavonia Drafts, MD 06/25/18 2337

## 2018-06-25 NOTE — ED Notes (Signed)
Pt very lethargic, awakens to answer questions and then continues to fall asleep. EDP aware.

## 2018-06-26 DIAGNOSIS — Z7984 Long term (current) use of oral hypoglycemic drugs: Secondary | ICD-10-CM | POA: Diagnosis not present

## 2018-06-26 DIAGNOSIS — K219 Gastro-esophageal reflux disease without esophagitis: Secondary | ICD-10-CM | POA: Diagnosis present

## 2018-06-26 DIAGNOSIS — D509 Iron deficiency anemia, unspecified: Secondary | ICD-10-CM | POA: Diagnosis not present

## 2018-06-26 DIAGNOSIS — E785 Hyperlipidemia, unspecified: Secondary | ICD-10-CM | POA: Diagnosis present

## 2018-06-26 DIAGNOSIS — Z803 Family history of malignant neoplasm of breast: Secondary | ICD-10-CM | POA: Diagnosis not present

## 2018-06-26 DIAGNOSIS — I130 Hypertensive heart and chronic kidney disease with heart failure and stage 1 through stage 4 chronic kidney disease, or unspecified chronic kidney disease: Secondary | ICD-10-CM | POA: Diagnosis present

## 2018-06-26 DIAGNOSIS — N183 Chronic kidney disease, stage 3 (moderate): Secondary | ICD-10-CM | POA: Diagnosis present

## 2018-06-26 DIAGNOSIS — Z79890 Hormone replacement therapy (postmenopausal): Secondary | ICD-10-CM | POA: Diagnosis not present

## 2018-06-26 DIAGNOSIS — Z85820 Personal history of malignant melanoma of skin: Secondary | ICD-10-CM | POA: Diagnosis not present

## 2018-06-26 DIAGNOSIS — Z8249 Family history of ischemic heart disease and other diseases of the circulatory system: Secondary | ICD-10-CM | POA: Diagnosis not present

## 2018-06-26 DIAGNOSIS — G35 Multiple sclerosis: Secondary | ICD-10-CM | POA: Diagnosis present

## 2018-06-26 DIAGNOSIS — Z7902 Long term (current) use of antithrombotics/antiplatelets: Secondary | ICD-10-CM | POA: Diagnosis not present

## 2018-06-26 DIAGNOSIS — I252 Old myocardial infarction: Secondary | ICD-10-CM | POA: Diagnosis not present

## 2018-06-26 DIAGNOSIS — Z955 Presence of coronary angioplasty implant and graft: Secondary | ICD-10-CM | POA: Diagnosis not present

## 2018-06-26 DIAGNOSIS — Z20828 Contact with and (suspected) exposure to other viral communicable diseases: Secondary | ICD-10-CM | POA: Diagnosis present

## 2018-06-26 DIAGNOSIS — I959 Hypotension, unspecified: Secondary | ICD-10-CM | POA: Diagnosis present

## 2018-06-26 DIAGNOSIS — I2510 Atherosclerotic heart disease of native coronary artery without angina pectoris: Secondary | ICD-10-CM | POA: Diagnosis present

## 2018-06-26 DIAGNOSIS — R4182 Altered mental status, unspecified: Secondary | ICD-10-CM | POA: Diagnosis not present

## 2018-06-26 DIAGNOSIS — Z823 Family history of stroke: Secondary | ICD-10-CM | POA: Diagnosis not present

## 2018-06-26 DIAGNOSIS — I5022 Chronic systolic (congestive) heart failure: Secondary | ICD-10-CM | POA: Diagnosis present

## 2018-06-26 DIAGNOSIS — F319 Bipolar disorder, unspecified: Secondary | ICD-10-CM | POA: Diagnosis present

## 2018-06-26 DIAGNOSIS — E162 Hypoglycemia, unspecified: Secondary | ICD-10-CM | POA: Diagnosis not present

## 2018-06-26 DIAGNOSIS — I69354 Hemiplegia and hemiparesis following cerebral infarction affecting left non-dominant side: Secondary | ICD-10-CM | POA: Diagnosis not present

## 2018-06-26 DIAGNOSIS — E871 Hypo-osmolality and hyponatremia: Secondary | ICD-10-CM | POA: Diagnosis present

## 2018-06-26 DIAGNOSIS — I161 Hypertensive emergency: Secondary | ICD-10-CM | POA: Diagnosis not present

## 2018-06-26 DIAGNOSIS — N390 Urinary tract infection, site not specified: Secondary | ICD-10-CM | POA: Diagnosis present

## 2018-06-26 DIAGNOSIS — E039 Hypothyroidism, unspecified: Secondary | ICD-10-CM | POA: Diagnosis present

## 2018-06-26 DIAGNOSIS — E1140 Type 2 diabetes mellitus with diabetic neuropathy, unspecified: Secondary | ICD-10-CM | POA: Diagnosis present

## 2018-06-26 LAB — GLUCOSE, CAPILLARY
Glucose-Capillary: 49 mg/dL — ABNORMAL LOW (ref 70–99)
Glucose-Capillary: 64 mg/dL — ABNORMAL LOW (ref 70–99)
Glucose-Capillary: 50 mg/dL — ABNORMAL LOW (ref 70–99)
Glucose-Capillary: 49 mg/dL — ABNORMAL LOW (ref 70–99)
Glucose-Capillary: 58 mg/dL — ABNORMAL LOW (ref 70–99)
Glucose-Capillary: 90 mg/dL (ref 70–99)
Glucose-Capillary: 64 mg/dL — ABNORMAL LOW (ref 70–99)
Glucose-Capillary: 113 mg/dL — ABNORMAL HIGH (ref 70–99)
Glucose-Capillary: 78 mg/dL (ref 70–99)
Glucose-Capillary: 63 mg/dL — ABNORMAL LOW (ref 70–99)

## 2018-06-26 LAB — BASIC METABOLIC PANEL
Anion gap: 9 (ref 5–15)
BUN: 15 mg/dL (ref 6–20)
CO2: 22 mmol/L (ref 22–32)
Calcium: 8.2 mg/dL — ABNORMAL LOW (ref 8.9–10.3)
Chloride: 93 mmol/L — ABNORMAL LOW (ref 98–111)
Creatinine, Ser: 1.87 mg/dL — ABNORMAL HIGH (ref 0.44–1.00)
GFR calc Af Amer: 36 mL/min — ABNORMAL LOW (ref >60)
GFR calc non Af Amer: 31 mL/min — ABNORMAL LOW (ref >60)
Glucose, Bld: 101 mg/dL — ABNORMAL HIGH (ref 70–99)
Potassium: 3.5 mmol/L (ref 3.5–5.1)
Sodium: 124 mmol/L — ABNORMAL LOW (ref 135–145)

## 2018-06-26 LAB — PHOSPHORUS: Phosphorus: 3.3 mg/dL (ref 2.5–4.6)

## 2018-06-26 LAB — FERRITIN: Ferritin: 8 ng/mL — ABNORMAL LOW (ref 11–307)

## 2018-06-26 LAB — TSH: TSH: 4.576 u[IU]/mL — ABNORMAL HIGH (ref 0.350–4.500)

## 2018-06-26 LAB — IRON AND TIBC
Iron: 13 ug/dL — ABNORMAL LOW (ref 28–170)
Saturation Ratios: 3 % — ABNORMAL LOW (ref 10.4–31.8)
TIBC: 412 ug/dL (ref 250–450)
UIBC: 399 ug/dL

## 2018-06-26 LAB — CBC
HCT: 28.1 % — ABNORMAL LOW (ref 36.0–46.0)
Hemoglobin: 8.5 g/dL — ABNORMAL LOW (ref 12.0–15.0)
MCH: 19.4 pg — ABNORMAL LOW (ref 26.0–34.0)
MCHC: 30.2 g/dL (ref 30.0–36.0)
MCV: 64.2 fL — ABNORMAL LOW (ref 80.0–100.0)
Platelets: 164 10*3/uL (ref 150–400)
RBC: 4.38 MIL/uL (ref 3.87–5.11)
RDW: 15.9 % — ABNORMAL HIGH (ref 11.5–15.5)
WBC: 9.7 10*3/uL (ref 4.0–10.5)
nRBC: 0 % (ref 0.0–0.2)

## 2018-06-26 LAB — CORTISOL-AM, BLOOD: Cortisol - AM: 6.9 ug/dL (ref 6.7–22.6)

## 2018-06-26 LAB — MAGNESIUM: Magnesium: 1.4 mg/dL — ABNORMAL LOW (ref 1.7–2.4)

## 2018-06-26 MED ORDER — ROSUVASTATIN CALCIUM 20 MG PO TABS
40.0000 mg | ORAL_TABLET | Freq: Every day | ORAL | Status: DC
  Administered 2018-06-26 – 2018-06-28 (×3): 40 mg via ORAL
  Filled 2018-06-26: qty 2
  Filled 2018-06-26: qty 4
  Filled 2018-06-26: qty 2
  Filled 2018-06-26: qty 4
  Filled 2018-06-26: qty 2

## 2018-06-26 MED ORDER — ACETAMINOPHEN 650 MG RE SUPP: 650.0000 mg | Freq: Four times a day (QID) | RECTAL | Status: DC | PRN

## 2018-06-26 MED ORDER — HYDRALAZINE HCL 20 MG/ML IJ SOLN
10.0000 mg | Freq: Four times a day (QID) | INTRAMUSCULAR | Status: DC | PRN
  Administered 2018-06-26: 10 mg via INTRAVENOUS

## 2018-06-26 MED ORDER — CLOPIDOGREL BISULFATE 75 MG PO TABS
75.0000 mg | ORAL_TABLET | Freq: Every day | ORAL | Status: DC
  Administered 2018-06-26 – 2018-06-28 (×3): 75 mg via ORAL
  Filled 2018-06-26 (×3): qty 1

## 2018-06-26 MED ORDER — ENOXAPARIN SODIUM 40 MG/0.4ML ~~LOC~~ SOLN
40.0000 mg | SUBCUTANEOUS | Status: DC
  Administered 2018-06-27 – 2018-06-28 (×2): 40 mg via SUBCUTANEOUS
  Filled 2018-06-26 (×2): qty 0.4

## 2018-06-26 MED ORDER — PROMETHAZINE HCL 25 MG/ML IJ SOLN
25.0000 mg | Freq: Four times a day (QID) | INTRAMUSCULAR | Status: DC | PRN
  Administered 2018-06-26: 25 mg via INTRAVENOUS
  Filled 2018-06-26: qty 1

## 2018-06-26 MED ORDER — LEVOTHYROXINE SODIUM 25 MCG PO TABS
25.0000 ug | ORAL_TABLET | Freq: Every day | ORAL | Status: DC
  Administered 2018-06-26 – 2018-06-28 (×3): 25 ug via ORAL
  Filled 2018-06-26: qty 1
  Filled 2018-06-26 (×2): qty 0.5

## 2018-06-26 MED ORDER — GABAPENTIN 300 MG PO CAPS
300.0000 mg | ORAL_CAPSULE | Freq: Two times a day (BID) | ORAL | Status: DC
  Administered 2018-06-26 – 2018-06-28 (×5): 300 mg via ORAL
  Filled 2018-06-26 (×5): qty 1

## 2018-06-26 MED ORDER — SODIUM CHLORIDE 0.9 % IV SOLN
200.0000 mg | Freq: Once | INTRAVENOUS | Status: AC
  Administered 2018-06-26: 200 mg via INTRAVENOUS
  Filled 2018-06-26: qty 10

## 2018-06-26 MED ORDER — SODIUM CHLORIDE 0.9 % IV SOLN: INTRAVENOUS | Status: DC

## 2018-06-26 MED ORDER — INSULIN ASPART 100 UNIT/ML ~~LOC~~ SOLN: 0.0000 [IU] | Freq: Three times a day (TID) | SUBCUTANEOUS | Status: DC

## 2018-06-26 MED ORDER — RIFAXIMIN 200 MG PO TABS
200.0000 mg | ORAL_TABLET | Freq: Three times a day (TID) | ORAL | Status: DC
  Administered 2018-06-26 – 2018-06-28 (×7): 200 mg via ORAL
  Filled 2018-06-26 (×9): qty 1

## 2018-06-26 MED ORDER — ISOSORBIDE MONONITRATE ER 30 MG PO TB24
30.0000 mg | ORAL_TABLET | Freq: Every day | ORAL | Status: DC
  Administered 2018-06-26 – 2018-06-28 (×3): 30 mg via ORAL
  Filled 2018-06-26 (×3): qty 1

## 2018-06-26 MED ORDER — ONDANSETRON HCL 4 MG/2ML IJ SOLN
4.0000 mg | Freq: Four times a day (QID) | INTRAMUSCULAR | Status: DC | PRN
  Administered 2018-06-26 – 2018-06-27 (×2): 4 mg via INTRAVENOUS
  Filled 2018-06-26 (×2): qty 2

## 2018-06-26 MED ORDER — SODIUM CHLORIDE 0.9 % IV SOLN
1.0000 g | Freq: Every day | INTRAVENOUS | Status: DC
  Administered 2018-06-26 – 2018-06-27 (×3): 1 g via INTRAVENOUS
  Filled 2018-06-26: qty 1
  Filled 2018-06-26: qty 10
  Filled 2018-06-26 (×2): qty 1

## 2018-06-26 MED ORDER — CITALOPRAM HYDROBROMIDE 20 MG PO TABS
40.0000 mg | ORAL_TABLET | Freq: Every day | ORAL | Status: DC
  Administered 2018-06-26 – 2018-06-28 (×3): 40 mg via ORAL
  Filled 2018-06-26 (×3): qty 2

## 2018-06-26 MED ORDER — ACETAMINOPHEN 325 MG PO TABS: 650.0000 mg | ORAL_TABLET | Freq: Four times a day (QID) | ORAL | Status: DC | PRN

## 2018-06-26 MED ORDER — ADULT MULTIVITAMIN W/MINERALS CH
1.0000 | ORAL_TABLET | Freq: Every day | ORAL | Status: DC
  Administered 2018-06-26 – 2018-06-28 (×3): 1 via ORAL
  Filled 2018-06-26 (×3): qty 1

## 2018-06-26 MED ORDER — MAGNESIUM SULFATE 4 GM/100ML IV SOLN
4.0000 g | Freq: Once | INTRAVENOUS | Status: AC
  Administered 2018-06-26: 4 g via INTRAVENOUS
  Filled 2018-06-26 (×2): qty 100

## 2018-06-26 MED ORDER — SODIUM CHLORIDE 0.9 % IV SOLN
INTRAVENOUS | Status: DC
  Administered 2018-06-26 (×2): via INTRAVENOUS

## 2018-06-26 MED ORDER — HYDRALAZINE HCL 20 MG/ML IJ SOLN
INTRAMUSCULAR | Status: AC
  Administered 2018-06-26: 10 mg via INTRAVENOUS
  Filled 2018-06-26: qty 1

## 2018-06-26 MED ORDER — VITAMIN B-1 100 MG PO TABS
100.0000 mg | ORAL_TABLET | Freq: Every day | ORAL | Status: DC
  Administered 2018-06-26 – 2018-06-28 (×3): 100 mg via ORAL
  Filled 2018-06-26 (×3): qty 1

## 2018-06-26 MED ORDER — ENOXAPARIN SODIUM 40 MG/0.4ML ~~LOC~~ SOLN
40.0000 mg | SUBCUTANEOUS | Status: DC
  Administered 2018-06-26: 40 mg via SUBCUTANEOUS
  Filled 2018-06-26: qty 0.4

## 2018-06-26 MED ORDER — BUPROPION HCL ER (XL) 150 MG PO TB24
150.0000 mg | ORAL_TABLET | Freq: Every day | ORAL | Status: DC
  Administered 2018-06-26 – 2018-06-28 (×3): 150 mg via ORAL
  Filled 2018-06-26 (×3): qty 1

## 2018-06-26 MED ORDER — PANTOPRAZOLE SODIUM 40 MG PO TBEC
40.0000 mg | DELAYED_RELEASE_TABLET | Freq: Two times a day (BID) | ORAL | Status: DC
  Administered 2018-06-26 – 2018-06-28 (×5): 40 mg via ORAL
  Filled 2018-06-26 (×5): qty 1

## 2018-06-26 MED ORDER — METOPROLOL SUCCINATE ER 25 MG PO TB24
12.5000 mg | ORAL_TABLET | Freq: Every day | ORAL | Status: DC
  Administered 2018-06-26 – 2018-06-28 (×3): 12.5 mg via ORAL
  Filled 2018-06-26 (×3): qty 1

## 2018-06-26 MED ORDER — POLYETHYLENE GLYCOL 3350 17 G PO PACK: 17.0000 g | PACK | Freq: Every day | ORAL | Status: DC | PRN

## 2018-06-26 MED ORDER — ONDANSETRON HCL 4 MG PO TABS: 4.0000 mg | ORAL_TABLET | Freq: Four times a day (QID) | ORAL | Status: DC | PRN

## 2018-06-26 MED ORDER — POTASSIUM CHLORIDE CRYS ER 20 MEQ PO TBCR
40.0000 meq | EXTENDED_RELEASE_TABLET | Freq: Once | ORAL | Status: AC
  Administered 2018-06-26: 40 meq via ORAL
  Filled 2018-06-26: qty 2

## 2018-06-26 MED ORDER — FOLIC ACID 1 MG PO TABS
1.0000 mg | ORAL_TABLET | Freq: Every day | ORAL | Status: DC
  Administered 2018-06-26 – 2018-06-28 (×3): 1 mg via ORAL
  Filled 2018-06-26 (×3): qty 1

## 2018-06-26 MED ORDER — METFORMIN HCL 500 MG PO TABS: 500.0000 mg | ORAL_TABLET | Freq: Two times a day (BID) | ORAL | Status: DC

## 2018-06-26 NOTE — ED Notes (Signed)
Hospitalist paged to inform fingerstick blood glucose results.

## 2018-06-26 NOTE — Progress Notes (Signed)
BP 202/89, HR 66. BP medications given. Will recheck. Alerted Dr. Bridgett Larsson.   Fuller Mandril, RN

## 2018-06-26 NOTE — Progress Notes (Signed)
BP decreased 131/70, HR 87. Will continue to monitor.   Fuller Mandril, RN

## 2018-06-26 NOTE — Progress Notes (Addendum)
I saw the patient at bedside and examined the patient.  Vital signs reviewed, labs reviewed. The patient blood pressure fluctuate, initially hypotensive in the ED.  Blood pressure up to 200, resume home hypertension medication, given IV hydralazine blood pressure decreased to 127/64.  The patient has dizziness, nausea and generalized weakness.  Hold metformin due to hypoglycemia, start sliding scale. Continue current treatment.  Physical therapy evaluation suggest home health and PT.  I discussed with the patient and RN.  Time spent about 38 minutes.

## 2018-06-26 NOTE — Progress Notes (Addendum)
Hypoglycemic Event  CBG: 58  Treatment: Due to CKD 3 lemon-lime soda given to patient   Symptoms: Nonsymptomatic  Follow-up CBG: Time: 0800 CBG Result:49  Possible Reasons for Event: N/A  Comments/MD notified: Dr. Bridgett Larsson notified    Fuller Mandril, RN

## 2018-06-26 NOTE — Progress Notes (Signed)
BP rechecked, 201/92, HR 105. Pt nonsymptomatic. Per Dr. Bridgett Larsson administer 10 mg Hydralazine first dose now IV, and q 6 PRN for systolic >956 and dystolic >387.   Fuller Mandril, RN

## 2018-06-26 NOTE — Evaluation (Signed)
Physical Therapy Evaluation Patient Details Name: [name] MRN: [id number] DOB: [birth year] Today's Date: 06/26/2018   History of Present Illness   48 y.o. female with an extensive medical history including CVA x4 with residual left-sided weakness, diabetes mellitus, history of MI, CKD, COPD, bipolar disorder, and a history of iron deficiency anemia.  She presents to the emergency room complaining of diffuse generalized weakness as well as generalized malaise and fatigue and dizziness.  Dizziness is made worse with standing.  Patient  found to be hypotensive and hypoglycemic, recently pt hypertensive during admission.     Clinical Impression  Pt alert, oriented, behavior WFLs during session. Pt stated that she lives with her son, currently has HHPT, independent in ADLs, son assists with IADLs as needed. Will use rollator or SPC for ambulation, and endorses multiple falls in last last week or so.    Pt performed bed mobility independently. BP assessed in multiple positions, RN informed of results and details in vitals flowsheet. Sit <> stand with RW mod I. Pt able to stand at bedside for several minutes with RW and CGA/supervision to asssess BP. Pt ambulated ~32ft with RW and modI, deferred further ambulation due to nausea. Pt reported at home her legs often give out on her, she fatigues quickly. The patient demonstrated near return to baseline level of functioning, but would benefit from further skilled PT to maximize gait, balance, and endurance. Recommendation is HHPT.    Follow Up Recommendations Home health PT    Equipment Recommendations  None recommended by PT(Pt has rollator and [facility])    Recommendations for Other Services       Precautions / Restrictions Precautions Precautions: Fall Restrictions Weight Bearing Restrictions: No      Mobility  Bed Mobility Overal bed mobility: Independent                Transfers Overall transfer level: Modified  independent Equipment used: Rolling walker (2 wheeled)                Ambulation/Gait Ambulation/Gait assistance: Modified independent (Device/Increase time) Gait Distance (Feet): 15 Feet Assistive device: Rolling walker (2 wheeled)       General Gait Details: Short stride bilaterally, decreased gait speed, overall steady. Pt deferred further ambulation due to nausea.  Stairs            Wheelchair Mobility    Modified Rankin (Stroke Patients Only)       Balance Overall balance assessment: Needs assistance Sitting-balance support: Feet supported Sitting balance-Leahy Scale: Normal       Standing balance-Leahy Scale: Fair                               Pertinent Vitals/Pain Pain Assessment: No/denies pain(complains of nausea)    Home Living   Living Arrangements: Children(son) Available Help at Discharge: Family;Available PRN/intermittently Type of Home: House Home Access: Stairs to enter Entrance Stairs-Rails: Right Entrance Stairs-Number of Steps: 10 Home Layout: One level Home Equipment: Walker - 4 wheels;Cane - single point;Shower seat      Prior Function Level of Independence: Needs assistance   Gait / Transfers Assistance Needed: Pt changes between rollator and cane, most recently using rollator  ADL's / Homemaking Assistance Needed: indep with ADL, cooking, and med mgt; son drives and assists with groceries, housework, etc.  Comments: Pt reported a lot of recent falls. Currently has [place]  Extremity/Trunk Assessment   Upper Extremity Assessment Upper Extremity Assessment: Generalized weakness    Lower Extremity Assessment Lower Extremity Assessment: Generalized weakness       Communication   Communication: No difficulties  Cognition Arousal/Alertness: Awake/alert Behavior During Therapy: WFL for tasks assessed/performed Overall Cognitive Status: Within Functional Limits for tasks assessed                                         General Comments      Exercises     Assessment/Plan    PT Assessment Patient needs continued PT services  PT Problem List Decreased strength;Decreased mobility;Decreased balance       PT Treatment Interventions DME instruction;Functional mobility training;Balance training;Patient/family education;Gait training;Therapeutic activities;Neuromuscular re-education;Stair training;Therapeutic exercise    PT Goals (Current goals can be found in the Care Plan section)  Acute Rehab PT Goals Patient Stated Goal: to go home PT Goal Formulation: With patient Time For Goal Achievement: 07/10/18 Potential to Achieve Goals: Good    Frequency Min 2X/week   Barriers to discharge        Co-evaluation               AM-PAC PT "6 Clicks" Mobility  Outcome Measure Help needed turning from your back to your side while in a flat bed without using bedrails?: None Help needed moving from lying on your back to sitting on the side of a flat bed without using bedrails?: None Help needed moving to and from a bed to a chair (including a wheelchair)?: None Help needed standing up from a chair using your arms (e.g., wheelchair or bedside chair)?: None Help needed to walk in hospital room?: A Little Help needed climbing 3-5 steps with a railing? : A Little 6 Click Score: 22    End of Session Equipment Utilized During Treatment: Gait belt Activity Tolerance: Other (comment)(limited due to pt reported nausea) Patient left: in chair;with call bell/phone within reach(RN gave consent for no chair alarm at pt request) Nurse Communication: Mobility status PT Visit Diagnosis: Unsteadiness on feet (R26.81);Other abnormalities of gait and mobility (R26.89)    Time: 4627-0350 PT Time Calculation (min) (ACUTE ONLY): 19 min   Charges:   PT Evaluation $PT Eval Low Complexity: 1 Low          Lieutenant Diego PT, DPT 12:39  PM,06/26/18 [phone]

## 2018-06-26 NOTE — Consult Note (Signed)
PHARMACY CONSULT NOTE - FOLLOW UP  Pharmacy Consult for Electrolyte Monitoring and Replacement   Recent Labs: Potassium (mmol/L)  Date Value  06/26/2018 3.5  12/30/2013 4.8   Magnesium (mg/dL)  Date Value  06/26/2018 1.4 (L)  12/30/2013 2.5 (H)   Calcium (mg/dL)  Date Value  06/26/2018 8.2 (L)   Calcium, Total (mg/dL)  Date Value  12/30/2013 8.6   Albumin (g/dL)  Date Value  06/25/2018 3.5  06/20/2018 4.0  12/30/2013 3.5   Phosphorus (mg/dL)  Date Value  06/26/2018 3.3  12/29/2013 2.4 (L)   Sodium (mmol/L)  Date Value  06/26/2018 124 (L)  06/20/2018 127 (L)  12/30/2013 136     Assessment: Patient presented to the emergency room complaining of diffuse generalized weakness as well as generalized malaise and fatigue and dizziness. Pharmacy consulted to manage electrolytes. K+ 3.5 and Mg 1.4. Pt has a one time order for Mg 4 g. Corrected Ca. 8.6   Goal of Therapy:  Electrolytes WNL   Plan:  Mag sulfate 4 g ordered x 1. Will also order a potassium 40 mEq PO x 1. Will recheck Mg and potassium level with AM labs.   Oswald Hillock ,PharmD, BCPS  Clinical Pharmacist 06/26/2018 7:09 AM

## 2018-06-26 NOTE — TOC Initial Note (Signed)
Transition of Care [facility]) - Initial/Assessment Note    Patient Details  Name: [name] MRN: [id number] Date of Birth: 05/01/1970  Transition of Care [facility]) CM/SW Contact:    Latanya Maudlin, RN Phone Number: 06/26/2018, 10:33 AM  Clinical Narrative:   TOC team met to assess patient as she meets high risk readmission criteria. Patient currently lives at home with her son. Patient uses a walker and cane for ambulation. She mentions possibly needing a bedside commode as well as she only has a shower chair as of now but she will check with her son. Son is able to provide transportation. Patient reports that she gets her medications from [facility] and her PCP is Dr. Caryn Section. CSW notified Corene Cornea with advanced that patient would like to continue services with them.  Expected Discharge Plan: [place] Barriers to Discharge: Continued Medical Work up   Patient Goals and CMS Choice Patient states their goals for this hospitalization and ongoing recovery are:: to go home with my son CMS Medicare.gov Compare Post Acute Care list provided to:: Patient Choice offered to / list presented to : Patient  Expected Discharge Plan and Services Expected Discharge Plan: [place]   Discharge Planning Services: CM Consult Post Acute Care Choice: Home Health, Resumption of Svcs/PTA Provider Living arrangements for the past 2 months: Single Family Home                               Date [place]: 06/26/18 Time [place]: 1033 Representative spoke with at [place]: Corene Cornea  Prior Living Arrangements/Services Living arrangements for the past 2 months: [place] with:: Adult Children   Do you feel safe going back to the place where you live?: Yes          Current home services: Home OT, Home PT    Activities of Daily Living Home Assistive Devices/Equipment: Cane (specify quad or straight), Walker (specify type)(4  wheels) ADL Screening (condition at time of admission) Patient's cognitive ability adequate to safely complete daily activities?: Yes Is the patient deaf or have difficulty hearing?: No Does the patient have difficulty seeing, even when wearing glasses/contacts?: No Does the patient have difficulty concentrating, remembering, or making decisions?: No Patient able to express need for assistance with ADLs?: Yes Does the patient have difficulty dressing or bathing?: No Independently performs ADLs?: Yes (appropriate for developmental age) Communication: Independent Dressing (OT): Independent Grooming: Independent Feeding: Independent Bathing: Independent Is this a change from baseline?: Pre-admission baseline Toileting: Independent In/Out Bed: Independent Walks in Home: Independent with device (comment)(waler or cane) Does the patient have difficulty walking or climbing stairs?: Yes Weakness of Legs: Both Weakness of Arms/Hands: Both  Permission Sought/Granted                  Emotional Assessment              Admission diagnosis:  Hyponatremia [E87.1] Hypotension, unspecified hypotension type [I95.9] Patient Active Problem List   Diagnosis Date Noted  . Hypotension 06/26/2018  . Autonomic neuropathy 03/24/2018  . Acute delirium 03/03/2018  . H/O gastric bypass 03/02/2018  . MI, acute, non ST segment elevation ([place])   . Effort angina ([place]) 06/29/2017  . Unstable angina ([place]) 06/24/2017  . Syncope 04/09/2017  . Insomnia 03/18/2017  . Ischemic cardiomyopathy   . Arthritis   . Anxiety   . Acute  colitis 01/27/2017  . Hx of colonic polyps   . H/O medication noncompliance 12/14/2015  . Emesis   . Atherosclerosis of native coronary artery of native heart with stable angina pectoris (HCC)   . Hypertensive heart disease   . CKD (chronic kidney disease), stage III (HCC) 06/05/2015  . Presence of drug coated stent in LAD coronary artery & now in D1 06/04/2015  . Colitis  06/03/2015  . Carotid stenosis 04/30/2015  . Type 2 diabetes mellitus with stage 3 chronic kidney disease, without long-term current use of insulin (HCC)   . Stable angina pectoris (HCC) 04/17/2015  . Iron deficiency anemia 03/22/2015  . Vitamin B12 deficiency 02/18/2015  . Misuse of medications for pain 02/18/2015  . Major depressive disorder, recurrent, severe with psychotic features (HCC) 02/15/2015  . Helicobacter pylori infection 11/23/2014  . Hemiparesis, left (HCC) 11/23/2014  . Benign neoplasm of colon 11/20/2014  . Malignant melanoma (HCC) 08/25/2014  . Chronic systolic CHF (congestive heart failure) (HCC)   . Incomplete bladder emptying 07/12/2014  . Adult hypothyroidism 12/30/2013  . Aberrant subclavian artery 11/17/2013  . Multiple sclerosis (HCC) 11/02/2013  . History of CVA (cerebrovascular accident) 06/20/2013  . Headache, migraine 05/29/2013  . Hyperlipidemia   . GERD (gastroesophageal reflux disease)   . Neuropathy (HCC) 01/02/2011  . Stroke (HCC) 06/21/2008  . Essential hypertension 05/01/2008   PCP:  Fisher, Donald E, MD Pharmacy:   [id number] - [place] ([place]), [place] - [address] [place] ([place]) [place] [address] Phone: [phone] Fax: [phone]  [id number] - [place],  - [address] [place]  [address] Phone: [phone] Fax: [phone]     Social Determinants of Health (SDOH) Interventions    Readmission Risk Interventions Readmission Risk Prevention Plan 06/26/2018 06/16/2018  Transportation Screening Complete Complete  Medication Review (RN Care Manager) Complete Complete  PCP or Specialist appointment within 3-5 days of discharge - Complete  HRI or Home Care Consult Complete Complete  SW Recovery Care/Counseling Consult - Not Complete  SW Consult Not Complete Comments - NA  Palliative Care Screening - Not Applicable  Skilled Nursing Facility - Not  Applicable  Some recent data might be hidden    

## 2018-06-26 NOTE — ED Notes (Addendum)
Hospitalist paged about fingerstick blood glucose of 64. Patient is alert and oriented, denies any discomfort.

## 2018-06-26 NOTE — Progress Notes (Signed)
Advanced Care Plan.  Purpose of Encounter: CODE STATUS. Parties in Attendance: Patient and me. Patient's Decisional Capacity: Yes. Medical Story: [name]  is a 48 y.o. female with an extensive medical history including CVA x4 with residual left-sided weakness, diabetes mellitus, history of MI, CKD, bipolar disorder, and a history of iron deficiency anemia.  She is admitted for hypotension, hypoglycemia and UTI.  I discussed with the patient about her current condition, prognosis and CODE STATUS.  The patient does want to be resuscitated and intubated if she has a cardiopulmonary arrest. Plan:  Code Status: Full code.:  Time spent discussing advance care planning: 17 minutes.

## 2018-06-26 NOTE — Progress Notes (Signed)
Hypoglycemic Event  CBG: 49  Treatment: Patient received breakfast  Symptoms: Nonsymptomatic  Follow-up CBG: Time: 0815 CBG Result: 90  Possible Reasons for Event: N/A  MD notified: Dr. Bridgett Larsson previously notified with initial hypoglycemic event, current CBG WDL, will continue to monitor.   Fuller Mandril, RN

## 2018-06-26 NOTE — ED Notes (Signed)
Patient is alert and oriented, asked appropriate questions and answers appropriately. Warm blanket given.

## 2018-06-26 NOTE — Progress Notes (Signed)
Per PT BP ranged from 192/80 to 151/98 to 121/80 during mobility evaluation. Pt's only complaint is some dizziness when sitting. Will contact Dr. Bridgett Larsson.   Fuller Mandril, RN

## 2018-06-27 LAB — BASIC METABOLIC PANEL
Anion gap: 13 (ref 5–15)
BUN: 12 mg/dL (ref 6–20)
CO2: 22 mmol/L (ref 22–32)
Calcium: 8.7 mg/dL — ABNORMAL LOW (ref 8.9–10.3)
Chloride: 103 mmol/L (ref 98–111)
Creatinine, Ser: 1.67 mg/dL — ABNORMAL HIGH (ref 0.44–1.00)
GFR calc Af Amer: 41 mL/min — ABNORMAL LOW (ref >60)
GFR calc non Af Amer: 36 mL/min — ABNORMAL LOW (ref >60)
Glucose, Bld: 76 mg/dL (ref 70–99)
Potassium: 4 mmol/L (ref 3.5–5.1)
Sodium: 138 mmol/L (ref 135–145)

## 2018-06-27 LAB — HEMOGLOBIN: Hemoglobin: 8 g/dL — ABNORMAL LOW (ref 12.0–15.0)

## 2018-06-27 LAB — GLUCOSE, CAPILLARY
Glucose-Capillary: 122 mg/dL — ABNORMAL HIGH (ref 70–99)
Glucose-Capillary: 135 mg/dL — ABNORMAL HIGH (ref 70–99)
Glucose-Capillary: 55 mg/dL — ABNORMAL LOW (ref 70–99)
Glucose-Capillary: 56 mg/dL — ABNORMAL LOW (ref 70–99)
Glucose-Capillary: 57 mg/dL — ABNORMAL LOW (ref 70–99)
Glucose-Capillary: 73 mg/dL (ref 70–99)
Glucose-Capillary: 75 mg/dL (ref 70–99)
Glucose-Capillary: 80 mg/dL (ref 70–99)
Glucose-Capillary: 91 mg/dL (ref 70–99)

## 2018-06-27 LAB — MAGNESIUM: Magnesium: 2.3 mg/dL (ref 1.7–2.4)

## 2018-06-27 MED ORDER — DEXTROSE 5 % IV SOLN
INTRAVENOUS | Status: DC
  Administered 2018-06-27: 02:00:00 via INTRAVENOUS

## 2018-06-27 MED ORDER — SODIUM CHLORIDE 0.9 % IV SOLN
INTRAVENOUS | Status: DC | PRN
  Administered 2018-06-27: 500 mL via INTRAVENOUS

## 2018-06-27 MED ORDER — ALPRAZOLAM 0.5 MG PO TABS
1.0000 mg | ORAL_TABLET | Freq: Once | ORAL | Status: AC
  Administered 2018-06-27: 1 mg via ORAL
  Filled 2018-06-27: qty 2

## 2018-06-27 NOTE — Consult Note (Signed)
PHARMACY CONSULT NOTE - FOLLOW UP  Pharmacy Consult for Electrolyte Monitoring and Replacement   Recent Labs: Potassium (mmol/L)  Date Value  06/27/2018 4.0  12/30/2013 4.8   Magnesium (mg/dL)  Date Value  06/27/2018 2.3  12/30/2013 2.5 (H)   Calcium (mg/dL)  Date Value  06/27/2018 8.7 (L)   Calcium, Total (mg/dL)  Date Value  12/30/2013 8.6   Albumin (g/dL)  Date Value  06/25/2018 3.5  06/20/2018 4.0  12/30/2013 3.5   Phosphorus (mg/dL)  Date Value  06/26/2018 3.3  12/29/2013 2.4 (L)   Sodium (mmol/L)  Date Value  06/27/2018 138  06/20/2018 127 (L)  12/30/2013 136     Assessment: Patient presented to the emergency room complaining of diffuse generalized weakness as well as generalized malaise and fatigue and dizziness. Pharmacy consulted to manage electrolytes. K+ 3.5 and Mg 1.4. Pt has a one time order for Mg 4 g. Corrected Ca. 8.6   Goal of Therapy:  Electrolytes WNL   Plan:  K 4.0 Mag 2.3  Scr 1.67 No supplementation at this time. Will recheck Mg and potassium level with AM labs.   Noralee Space ,PharmD, BCPS  Clinical Pharmacist 06/27/2018 9:18 AM

## 2018-06-27 NOTE — Progress Notes (Addendum)
[place] [facility] [place] NAME: [name]    MR#:  [id number]  DATE OF BIRTH:  01/29/1971  SUBJECTIVE:  CHIEF COMPLAINT:   Chief Complaint  Patient presents with  . Altered Mental Status   The patient feels better, she has no complaints. REVIEW OF SYSTEMS:  Review of Systems  Constitutional: Negative for chills, fever and malaise/fatigue.  HENT: Negative for sore throat.   Eyes: Negative for blurred vision and double vision.  Respiratory: Negative for cough, hemoptysis, shortness of breath, wheezing and stridor.   Cardiovascular: Negative for chest pain, palpitations, orthopnea and leg swelling.  Gastrointestinal: Negative for abdominal pain, blood in stool, diarrhea, melena, nausea and vomiting.  Genitourinary: Negative for dysuria, flank pain and hematuria.  Musculoskeletal: Negative for back pain and joint pain.  Skin: Negative for rash.  Neurological: Negative for dizziness, sensory change, focal weakness, seizures, loss of consciousness, weakness and headaches.  Endo/Heme/Allergies: Negative for polydipsia.  Psychiatric/Behavioral: Negative for depression. The patient is not nervous/anxious.     DRUG ALLERGIES:   Allergies  Allergen Reactions  . Lipitor [Atorvastatin] Other (See Comments)    Leg pains  . Tramadol Other (See Comments)    Mouth feels like it's on fire  . Lactose Intolerance (Gi) Nausea Only   VITALS:  Blood pressure (!) 156/75, pulse (!) 122, temperature 98.3 F (36.8 C), temperature source Oral, resp. rate 16, height 5\' 2"  (1.575 m), weight 75.2 kg, SpO2 98 %. PHYSICAL EXAMINATION:  Physical Exam Constitutional:      General: She is not in acute distress.    Appearance: Normal appearance.  HENT:     Head: Normocephalic.     Mouth/Throat:     Mouth: Mucous membranes are moist.  Eyes:     General: No scleral icterus.    Conjunctiva/sclera: Conjunctivae normal.     Pupils: Pupils are equal, round,  and reactive to light.  Neck:     Musculoskeletal: Normal range of motion and neck supple.     Vascular: No JVD.     Trachea: No tracheal deviation.  Cardiovascular:     Rate and Rhythm: Normal rate and regular rhythm.     Heart sounds: Normal heart sounds. No murmur. No gallop.   Pulmonary:     Effort: Pulmonary effort is normal. No respiratory distress.     Breath sounds: Normal breath sounds. No wheezing or rales.  Abdominal:     General: Bowel sounds are normal. There is no distension.     Palpations: Abdomen is soft.     Tenderness: There is no abdominal tenderness. There is no rebound.  Musculoskeletal: Normal range of motion.        General: No tenderness.     Right lower leg: No edema.     Left lower leg: No edema.  Skin:    Findings: No erythema or rash.  Neurological:     General: No focal deficit present.     Mental Status: She is alert and oriented to person, place, and time.     Cranial Nerves: No cranial nerve deficit.  Psychiatric:        Mood and Affect: Mood normal.    LABORATORY PANEL:  Female CBC Recent Labs  Lab 06/26/18 0309 06/27/18 0424  WBC 9.7  --   HGB 8.5* 8.0*  HCT 28.1*  --   PLT 164  --    ------------------------------------------------------------------------------------------------------------------ Chemistries  Recent Labs  Lab 06/25/18 2141  06/27/18 0424  NA 120*   < > 138  K 3.9   < > 4.0  CL 89*   < > 103  CO2 21*   < > 22  GLUCOSE 99   < > 76  BUN 17   < > 12  CREATININE 1.98*   < > 1.67*  CALCIUM 8.1*   < > 8.7*  MG  --    < > 2.3  AST 25  --   --   ALT 17  --   --   ALKPHOS 96  --   --   BILITOT 0.4  --   --    < > = values in this interval not displayed.   RADIOLOGY:  No results found. ASSESSMENT AND PLAN:   1. hypotension Improved with IV fluid support.  Hypertension urgency. The patient blood pressure was elevated to 200.  Last blood pressure is 156/75.  Heart rate is 122. Resumed home Lopressor and  Imdur, IV hydralazine PRN.  2. hypoglycemia - Hemoglobin A1c is 5.9.  Hold Coumadin.  Hypoglycemia is better with IV D5.  Discontinue IV D5 and monitor blood sugar.  3. diabetes mellitus, as above.  Sliding scale.  4.  History of iron deficiency anemia The patient got IV iron infusion in the hospital.  5. UTI - Patient treated 2 weeks ago during hospitalization for UTI with culture positive for Klebsiella Bonnet on 06/17/2018 sensitive to Rocephin and Cipro.  Patient had completed a course of Cipro outpatient since hospitalization.  IV Rocephin has been initiated.  New culture is pending.  6.  Generalized weakness - Physical therapy consulted for supportive care  7.  GERD - PPI therapy initiated  8. hyperlipidemia - Statin therapy continue  9. hyponatremia Improved with normal saline IV.  Hypomagnesemia.  Improved with IV magnesium. CKD stage III.  Stable.  All the records are reviewed and case discussed with Care Management/Social Worker. Management plans discussed with the patient, family and they are in agreement.  CODE STATUS: Full Code  TOTAL TIME TAKING CARE OF THIS PATIENT: 27 minutes.   More than 50% of the time was spent in counseling/coordination of care: YES  POSSIBLE D/C IN 1-2 DAYS, DEPENDING ON CLINICAL CONDITION.   Demetrios Loll M.D on 06/27/2018 at 12:39 PM  Between 7am to 6pm - Pager - [phone]  After 6pm go to [email or web address] - [facility]

## 2018-06-27 NOTE — Progress Notes (Signed)
   06/27/18 1500  Clinical Encounter Type  Visited With Patient  Visit Type Initial  Referral From Chaplain  Consult/Referral To Chaplain  Spiritual Encounters  Spiritual Needs Other (Comment)  Chaplain was rounding and stop to visit patient. Patient was lying in bed on the phone,chaplain stated she was just checking on her today and patient waved ok. Chaplain left.

## 2018-06-28 LAB — BASIC METABOLIC PANEL
Anion gap: 10 (ref 5–15)
BUN: 11 mg/dL (ref 6–20)
CO2: 24 mmol/L (ref 22–32)
Calcium: 8.8 mg/dL — ABNORMAL LOW (ref 8.9–10.3)
Chloride: 107 mmol/L (ref 98–111)
Creatinine, Ser: 1.59 mg/dL — ABNORMAL HIGH (ref 0.44–1.00)
GFR calc Af Amer: 44 mL/min — ABNORMAL LOW (ref >60)
GFR calc non Af Amer: 38 mL/min — ABNORMAL LOW (ref >60)
Glucose, Bld: 75 mg/dL (ref 70–99)
Potassium: 3.9 mmol/L (ref 3.5–5.1)
Sodium: 141 mmol/L (ref 135–145)

## 2018-06-28 LAB — URINE CULTURE: Culture: >=100000 — AB

## 2018-06-28 LAB — GLUCOSE, CAPILLARY
Glucose-Capillary: 69 mg/dL — ABNORMAL LOW (ref 70–99)
Glucose-Capillary: 93 mg/dL (ref 70–99)

## 2018-06-28 LAB — MAGNESIUM: Magnesium: 1.7 mg/dL (ref 1.7–2.4)

## 2018-06-28 MED ORDER — FOSFOMYCIN TROMETHAMINE 3 G PO PACK
3.0000 g | PACK | Freq: Once | ORAL | Status: AC
  Administered 2018-06-28: 3 g via ORAL
  Filled 2018-06-28: qty 3

## 2018-06-28 MED ORDER — FOSFOMYCIN TROMETHAMINE 3 G PO PACK: 3.0000 g | PACK | Freq: Once | ORAL | 0 refills | Status: AC

## 2018-06-28 NOTE — Telephone Encounter (Signed)
I called and spoke with patient's son [name] who reports that  patient has been hospitalized since 06/25/2018, and is being discharged right now. She has a hospital follow up appointment scheduled 07/04/2018. Patient has not been advised of this message due to hospitalization.

## 2018-06-28 NOTE — Discharge Summary (Signed)
[place] [facility] [place] NAME: [name]    MR#:  [id number]  DATE OF BIRTH:  1970/04/26  DATE OF ADMISSION:  06/25/2018 ADMITTING PHYSICIAN: Sela Hua, MD  DATE OF DISCHARGE: 06/28/2018  PRIMARY CARE PHYSICIAN: [name], MD    ADMISSION DIAGNOSIS:  Hyponatremia [E87.1] Hypotension, unspecified hypotension type [I95.9]  DISCHARGE DIAGNOSIS:  Active Problems:   Hypotension   SECONDARY DIAGNOSIS:   Past Medical History:  Diagnosis Date  . Acute pyelonephritis   . Anemia    iron deficiency anemia  . Aortic arch aneurysm ([place])   . Bipolar disorder ([place])   . BRCA negative 2014  . CAD (coronary artery disease)    a. 08/2003 Cath: LAD 30-40-med Rx; b. 11/2014 PCI: LAD 89m(3.25x23 Xience Alpine DES); c. 06/2015 PCI: D1 (2.25x12 Resolute Integrity DES); d. 06/2017 PCI: Patent mLAD stent, D2 95 (PTCA); e. 09/2017 PCI: D2 99ost (CBA); d. 12/2017 Cath: LM nl, LAD 312m80d (small), D1 40ost, D2 95ost, LCX 40p, RCA 40ost/p->Med rx for D2 given restenosis.  . CKD (chronic kidney disease), stage III ([facility]  . Colon polyp   . CVA (cerebral vascular accident) ([facility]   Left side weakness.   . Diabetes ([facility]  . Family history of breast cancer    BRCA neg 2014  . Gastric ulcer 04/27/2011  . History of echocardiogram    a. 03/2017 Echo: EF 60-65%, no rwma; b. 02/2018 Echo: EF 60-65%, no rwma. Nl RV fxn. No cardiac source of emboli (admitted w/ stroke).  . [place] (hypertension)   . Hyperlipemia   . Hypothyroid   . Malignant melanoma of skin of scalp ([facility]  . MI, acute, non ST segment elevation ([facility]  . Neuromuscular disorder ([facility]  . Orthostatic hypotension   . S/P drug eluting coronary stent placement 06/04/2015  . Sepsis ([facility]14/2019  . [facility]   a. 02/2018 MRI: 67m26mate acute/early subacute L medial frontal lobe inarct; b. 02/2018 MRA No large vessel occlusion or aneurysm. Mod to sev L P2 stenosis. thready L vertebral artery,  diffusely dzs'd; c. 02/2018 Carotid U/S: <50% bilat ICA dzs.    HOSPITAL COURSE:   [age]ar old female with history of CVA and residual left-sided weakness, diabetes, hypertension and bipolar disorder who presented to the emergency room due to altered mental status with generalized weakness and found to have hypotension.  1. Hypotension: Unclear etiology of patient's initial hypotension when arriving to the emergency room.  Her hypotension was resolved after fluid bolus.  2.  Hypertensive urgency: Patient then had elevation in her blood pressure.  Her blood pressure has now been controlled.  She will continue outpatient regimen.  3.  Diabetes: Her A1c is 5.9 We wil stop metformin for now since she has some low blood sugars and A1c is 5.9   4.  Enterococcus UTI: Patient was initially on Rocephin however once urine culture came back it appears that it is only sensitive to vancomycin.  She did receive past Forfomycin prior to discharge and will receive another dose 3 days later.   5.  Iron deficiency anemia: Patient did receive IV iron 6. CKD Stage 3: Creatinine is baseline   DISCHARGE CONDITIONS AND DIET:  Stable regular/diabetic diet  CONSULTS OBTAINED:    DRUG ALLERGIES:   Allergies  Allergen Reactions  . Lipitor [Atorvastatin] Other (See Comments)    Leg pains  . Tramadol Other (See Comments)  Mouth feels like it's on fire  . Lactose Intolerance (Gi) Nausea Only    DISCHARGE MEDICATIONS:   Allergies as of 06/28/2018      Reactions   Lipitor [atorvastatin] Other (See Comments)   Leg pains   Tramadol Other (See Comments)   Mouth feels like it's on fire   Lactose Intolerance (gi) Nausea Only      Medication List    STOP taking these medications   metFORMIN 500 MG 24 hr tablet Commonly known as:  GLUCOPHAGE-XR     TAKE these medications   ALPRAZolam 0.5 MG tablet Commonly known as:  XANAX Take 0.5-1 tablets (0.25-0.5 mg total) by mouth 2 (two) times daily as  needed for anxiety.   buPROPion 150 MG 24 hr tablet Commonly known as:  WELLBUTRIN XL TAKE 1 TABLET BY MOUTH DAILY   citalopram 40 MG tablet Commonly known as:  CELEXA Take 1 tablet by mouth once daily   clopidogrel 75 MG tablet Commonly known as:  PLAVIX Take 75 mg by mouth daily.   diphenoxylate-atropine 2.5-0.025 MG tablet Commonly known as:  LOMOTIL Take 1 tablet by mouth 4 (four) times daily as needed for diarrhea or loose stools.   fosfomycin 3 g Pack Commonly known as:  MONUROL Take 3 g by mouth once for 1 dose. Start taking on:  Jul 01, 2018   gabapentin 300 MG capsule Commonly known as:  NEURONTIN Take 1 capsule (300 mg total) by mouth 2 (two) times daily.   isosorbide mononitrate 30 MG 24 hr tablet Commonly known as:  IMDUR Take 1 tablet (30 mg total) by mouth daily.   levothyroxine 25 MCG tablet Commonly known as:  SYNTHROID Take 1 tablet (25 mcg total) by mouth daily before breakfast.   metoprolol succinate 25 MG 24 hr tablet Commonly known as:  Toprol XL Take 0.5 tablets (12.5 mg total) by mouth daily.   nitroGLYCERIN 0.4 MG SL tablet Commonly known as:  NITROSTAT Place 1 tablet (0.4 mg total) under the tongue every 5 (five) minutes as needed for chest pain.   ondansetron 4 MG tablet Commonly known as:  ZOFRAN Take 1 tablet (4 mg total) by mouth every 8 (eight) hours as needed.   ONE TOUCH ULTRA 2 w/Device Kit Use to check blood sugar once a day. Dx. E11.9   pantoprazole 40 MG tablet Commonly known as:  PROTONIX Take 1 tablet (40 mg total) by mouth 2 (two) times daily.   rifaximin 200 MG tablet Commonly known as:  XIFAXAN Take 1 tablet (200 mg total) by mouth 3 (three) times daily.   rosuvastatin 40 MG tablet Commonly known as:  CRESTOR Take 1 tablet (40 mg total) by mouth daily.   tiZANidine 4 MG tablet Commonly known as:  ZANAFLEX Take 4 mg by mouth daily as needed for pain.         Today   CHIEF COMPLAINT:  Doing well no  issues   VITAL SIGNS:  Blood pressure (!) 166/85, pulse 77, temperature 98.5 F (36.9 C), temperature source Oral, resp. rate 18, height '5\' 2"'  (1.575 m), weight 75.2 kg, SpO2 100 %.   REVIEW OF SYSTEMS:  Review of Systems  Constitutional: Negative.  Negative for chills, fever and malaise/fatigue.  HENT: Negative.  Negative for ear discharge, ear pain, hearing loss, nosebleeds and sore throat.   Eyes: Negative.  Negative for blurred vision and pain.  Respiratory: Negative.  Negative for cough, hemoptysis, shortness of breath and wheezing.   Cardiovascular: Negative.  Negative for chest pain, palpitations and leg swelling.  Gastrointestinal: Negative.  Negative for abdominal pain, blood in stool, diarrhea, nausea and vomiting.  Genitourinary: Negative.  Negative for dysuria.  Musculoskeletal: Negative.  Negative for back pain.  Skin: Negative.   Neurological: Negative for dizziness, tremors, speech change, focal weakness, seizures and headaches.  Endo/Heme/Allergies: Negative.  Does not bruise/bleed easily.  Psychiatric/Behavioral: Negative.  Negative for depression, hallucinations and suicidal ideas.     PHYSICAL EXAMINATION:  GENERAL:  48 y.o.-year-old patient lying in the bed with no acute distress.  NECK:  Supple, no jugular venous distention. No thyroid enlargement, no tenderness.  LUNGS: Normal breath sounds bilaterally, no wheezing, rales,rhonchi  No use of accessory muscles of respiration.  CARDIOVASCULAR: S1, S2 normal. No murmurs, rubs, or gallops.  ABDOMEN: Soft, non-tender, non-distended. Bowel sounds present. No organomegaly or mass.  EXTREMITIES: No pedal edema, cyanosis, or clubbing.  PSYCHIATRIC: The patient is alert and oriented x 3.  SKIN: No obvious rash, lesion, or ulcer.   DATA REVIEW:   CBC Recent Labs  Lab 06/26/18 0309 06/27/18 0424  WBC 9.7  --   HGB 8.5* 8.0*  HCT 28.1*  --   PLT 164  --     Chemistries  Recent Labs  Lab 06/25/18 2141   06/28/18 0259  NA 120*   < > 141  K 3.9   < > 3.9  CL 89*   < > 107  CO2 21*   < > 24  GLUCOSE 99   < > 75  BUN 17   < > 11  CREATININE 1.98*   < > 1.59*  CALCIUM 8.1*   < > 8.8*  MG  --    < > 1.7  AST 25  --   --   ALT 17  --   --   ALKPHOS 96  --   --   BILITOT 0.4  --   --    < > = values in this interval not displayed.    Cardiac Enzymes Recent Labs  Lab 06/25/18 2141  TROPONINI <0.03    Microbiology Results  '@MICRORSLT48' @  RADIOLOGY:  No results found.    Allergies as of 06/28/2018      Reactions   Lipitor [atorvastatin] Other (See Comments)   Leg pains   Tramadol Other (See Comments)   Mouth feels like it's on fire   Lactose Intolerance (gi) Nausea Only      Medication List    STOP taking these medications   metFORMIN 500 MG 24 hr tablet Commonly known as:  GLUCOPHAGE-XR     TAKE these medications   ALPRAZolam 0.5 MG tablet Commonly known as:  XANAX Take 0.5-1 tablets (0.25-0.5 mg total) by mouth 2 (two) times daily as needed for anxiety.   buPROPion 150 MG 24 hr tablet Commonly known as:  WELLBUTRIN XL TAKE 1 TABLET BY MOUTH DAILY   citalopram 40 MG tablet Commonly known as:  CELEXA Take 1 tablet by mouth once daily   clopidogrel 75 MG tablet Commonly known as:  PLAVIX Take 75 mg by mouth daily.   diphenoxylate-atropine 2.5-0.025 MG tablet Commonly known as:  LOMOTIL Take 1 tablet by mouth 4 (four) times daily as needed for diarrhea or loose stools.   fosfomycin 3 g Pack Commonly known as:  MONUROL Take 3 g by mouth once for 1 dose. Start taking on:  Jul 01, 2018   gabapentin 300 MG capsule Commonly known as:  NEURONTIN Take 1  capsule (300 mg total) by mouth 2 (two) times daily.   isosorbide mononitrate 30 MG 24 hr tablet Commonly known as:  IMDUR Take 1 tablet (30 mg total) by mouth daily.   levothyroxine 25 MCG tablet Commonly known as:  SYNTHROID Take 1 tablet (25 mcg total) by mouth daily before breakfast.   metoprolol  succinate 25 MG 24 hr tablet Commonly known as:  Toprol XL Take 0.5 tablets (12.5 mg total) by mouth daily.   nitroGLYCERIN 0.4 MG SL tablet Commonly known as:  NITROSTAT Place 1 tablet (0.4 mg total) under the tongue every 5 (five) minutes as needed for chest pain.   ondansetron 4 MG tablet Commonly known as:  ZOFRAN Take 1 tablet (4 mg total) by mouth every 8 (eight) hours as needed.   ONE TOUCH ULTRA 2 w/Device Kit Use to check blood sugar once a day. Dx. E11.9   pantoprazole 40 MG tablet Commonly known as:  PROTONIX Take 1 tablet (40 mg total) by mouth 2 (two) times daily.   rifaximin 200 MG tablet Commonly known as:  XIFAXAN Take 1 tablet (200 mg total) by mouth 3 (three) times daily.   rosuvastatin 40 MG tablet Commonly known as:  CRESTOR Take 1 tablet (40 mg total) by mouth daily.   tiZANidine 4 MG tablet Commonly known as:  ZANAFLEX Take 4 mg by mouth daily as needed for pain.         Management plans discussed with the patient and she is in agreement. Stable for discharge home  Patient should follow up with pcp  CODE STATUS:     Code Status Orders  (From admission, onward)         Start     Ordered   06/26/18 0143  Full code  Continuous     06/26/18 0142        Code Status History    Date Active Date Inactive Code Status Order ID Comments User Context   06/14/2018 2333 06/16/2018 1713 Full Code [id number]  Mayer Camel, NP ED   03/02/2018 1650 03/04/2018 1709 Full Code [id number]  Lavella Hammock, MD Inpatient   02/17/2018 2322 02/19/2018 1710 Full Code [id number]  Vaughan Basta, MD Inpatient   12/19/2017 1624 12/21/2017 1544 Full Code [id number]  Gladstone Lighter, MD Inpatient   09/19/2017 1657 09/21/2017 1519 Full Code [id number]  Loletha Grayer, MD ED   06/29/2017 1241 06/30/2017 1523 Full Code [id number]  Wellington Hampshire, MD Inpatient   04/09/2017 1448 04/13/2017 1738 DNR [id number]  Nicholes Mango, MD Inpatient   03/18/2017 2348 03/22/2017 1844  Full Code [id number]  Henreitta Leber, MD ED   03/16/2017 1539 03/16/2017 1937 Full Code [id number]  Poggi, Marshall Cork, MD Inpatient   01/27/2017 1934 01/29/2017 1839 Full Code [id number]  Salary, Avel Peace, MD Inpatient   01/07/2016 1630 01/07/2016 2016 Full Code [id number]  Corky Mull, MD Inpatient   12/14/2015 0239 12/14/2015 1733 Full Code [id number]  Harrie Foreman, MD ED   11/07/2015 1521 11/08/2015 1920 Full Code [id number]  Baxter Hire, MD Inpatient   10/28/2015 1604 11/02/2015 1504 Full Code [id number]  Henreitta Leber, MD Inpatient   06/03/2015 1510 06/05/2015 1501 Full Code [id number]  Fritzi Mandes, MD Inpatient   04/17/2015 2159 04/18/2015 2045 Full Code [id number]  Vaughan Basta, MD ED   02/15/2015 2004 02/20/2015 1903 Full Code [id number]  Gonzella Lex, MD Inpatient   11/12/2014 0833 11/13/2014 1449 Full Code [id number]  Isaias Cowman, MD Inpatient  11/07/2014 2153 11/12/2014 0833 Full Code [id number]  Henreitta Leber, MD Inpatient      TOTAL TIME TAKING CARE OF THIS PATIENT: 38 minutes.    Note: This dictation was prepared with Dragon dictation along with smaller phrase technology. Any transcriptional errors that result from this process are unintentional.  Bettey Costa M.D on 06/28/2018 at 12:35 PM  Between 7am to 6pm - Pager - [phone] After 6pm go to [email or web address] - password [email or web address] [facility]  Office  [phone]  CC: Primary care physician; [name], MD

## 2018-06-28 NOTE — Progress Notes (Signed)
06/28/2018 12:29 PM [place] to be D/C'd Home per MD order.  Discussed prescriptions and follow up appointments with the patient. Prescriptions given to patient, medication list explained in detail. Pt verbalized understanding.  Allergies as of 06/28/2018      Reactions   Lipitor [atorvastatin] Other (See Comments)   Leg pains   Tramadol Other (See Comments)   Mouth feels like it's on fire   Lactose Intolerance (gi) Nausea Only      Medication List    TAKE these medications   ALPRAZolam 0.5 MG tablet Commonly known as:  XANAX Take 0.5-1 tablets (0.25-0.5 mg total) by mouth 2 (two) times daily as needed for anxiety.   buPROPion 150 MG 24 hr tablet Commonly known as:  WELLBUTRIN XL TAKE 1 TABLET BY MOUTH DAILY   citalopram 40 MG tablet Commonly known as:  CELEXA Take 1 tablet by mouth once daily   clopidogrel 75 MG tablet Commonly known as:  PLAVIX Take 75 mg by mouth daily.   diphenoxylate-atropine 2.5-0.025 MG tablet Commonly known as:  LOMOTIL Take 1 tablet by mouth 4 (four) times daily as needed for diarrhea or loose stools.   fosfomycin 3 g Pack Commonly known as:  MONUROL Take 3 g by mouth once for 1 dose. Start taking on:  Jul 01, 2018   gabapentin 300 MG capsule Commonly known as:  NEURONTIN Take 1 capsule (300 mg total) by mouth 2 (two) times daily.   isosorbide mononitrate 30 MG 24 hr tablet Commonly known as:  IMDUR Take 1 tablet (30 mg total) by mouth daily.   levothyroxine 25 MCG tablet Commonly known as:  SYNTHROID Take 1 tablet (25 mcg total) by mouth daily before breakfast.   metFORMIN 500 MG 24 hr tablet Commonly known as:  GLUCOPHAGE-XR Take 500 mg by mouth daily.   metoprolol succinate 25 MG 24 hr tablet Commonly known as:  Toprol XL Take 0.5 tablets (12.5 mg total) by mouth daily.   nitroGLYCERIN 0.4 MG SL tablet Commonly known as:  NITROSTAT Place 1 tablet (0.4 mg total) under the tongue every 5 (five) minutes as needed for  chest pain.   ondansetron 4 MG tablet Commonly known as:  ZOFRAN Take 1 tablet (4 mg total) by mouth every 8 (eight) hours as needed.   ONE TOUCH ULTRA 2 w/Device Kit Use to check blood sugar once a day. Dx. E11.9   pantoprazole 40 MG tablet Commonly known as:  PROTONIX Take 1 tablet (40 mg total) by mouth 2 (two) times daily.   rifaximin 200 MG tablet Commonly known as:  XIFAXAN Take 1 tablet (200 mg total) by mouth 3 (three) times daily.   rosuvastatin 40 MG tablet Commonly known as:  CRESTOR Take 1 tablet (40 mg total) by mouth daily.   tiZANidine 4 MG tablet Commonly known as:  ZANAFLEX Take 4 mg by mouth daily as needed for pain.       Vitals:   06/27/18 2031 06/28/18 0436  BP: 136/74 (!) 166/85  Pulse: 77 77  Resp: 16 18  Temp: 98.2 F (36.8 C) 98.5 F (36.9 C)  SpO2: 100% 100%    Skin clean, dry and intact without evidence of skin break down, no evidence of skin tears noted. IV catheter discontinued intact. Site without signs and symptoms of complications. Dressing and pressure applied. Pt denies pain at this time. No complaints noted.  An After Visit Summary was printed and given to the patient. Patient escorted via [place],  and D/C home via private auto.  [name]

## 2018-06-28 NOTE — Care Management Important Message (Signed)
Important Message  Patient Details  Name: [name] MRN: [id number] Date of Birth: 1970/02/05   Medicare Important Message Given:  Yes    Dannette Barbara 06/28/2018, 10:28 AM

## 2018-06-28 NOTE — TOC Transition Note (Signed)
Transition of Care [facility]) - CM/SW Discharge Note   Patient Details  Name: [name] MRN: [id number] Date of Birth: 07-31-1970  Transition of [facility]) CM/SW Contact:  Ross Ludwig, LCSW Phone Number: 06/28/2018, 5:51 PM   Clinical Narrative:     Patient discharging back home with resumption of care with home health PT and OT.   Final next level of care: Home w Home Health Services Barriers to Discharge: Barriers Resolved   Patient Goals and CMS Choice Patient states their goals for this hospitalization and ongoing recovery are:: to go home with my son CMS Medicare.gov Compare Post Acute Care list provided to:: Patient Choice offered to / list presented to : Patient  Discharge Placement                 Home with home health.      Discharge Plan and Services   Discharge Planning Services: CM Consult Post Acute Care Choice: Home Health, Resumption of Svcs/PTA Provider          DME Arranged: N/A DME Agency: NA       HH Arranged: PT, OT [place] Agency: [place] ([facility]) Date [place]: 06/28/18 Time [place]: [place] Representative spoke with at [place]: [place] ([place]) Interventions     Readmission Risk Interventions Readmission Risk Prevention Plan 06/26/2018 06/16/2018  Transportation Screening Complete Complete  Medication Review Press photographer) Complete Complete  PCP or Specialist appointment within 3-5 days of discharge - Complete  HRI or Home Care Consult Complete Complete  SW Recovery Care/Counseling Consult - Not Complete  SW Consult Not Complete Comments - NA  Palliative Care Screening - Not [place] - Not Applicable  Some recent data might be hidden

## 2018-06-28 NOTE — Consult Note (Signed)
Pharmacy Antibiotic Note  [name] is a 48 y.o. female admitted on 06/25/2018 with AMS due to hypotension. Her urine has subsequently grown out VRE.  Pharmacy has been consulted for an appropriate oral antibiotic choice.  Plan: --fosfomycin 3 grams orally every 3 days for 2 doses only --give first dose here in the hospital  Height: 5\' 2"  (157.5 cm) Weight: 165 lb 12.6 oz (75.2 kg) IBW/kg (Calculated) : 50.1  Temp (24hrs), Avg:98.3 F (36.8 C), Min:98.2 F (36.8 C), Max:98.5 F (36.9 C)  Recent Labs  Lab 06/25/18 2139 06/25/18 2141 06/26/18 0309 06/27/18 0424 06/28/18 0259  WBC  --  13.2* 9.7  --   --   CREATININE  --  1.98* 1.87* 1.67* 1.59*  LATICACIDVEN 1.5  --   --   --   --     Estimated Creatinine Clearance: 40.6 mL/min (A) (by C-G formula based on SCr of 1.59 mg/dL (H)).     Antimicrobials this admission: 5/24 CTX >> 5/26 5/26 fosfomycin >>   Microbiology results: 5/23 BCx: NGTD 5/23 UCx: VRE   Thank you for allowing pharmacy to be a part of this patient's care.  Dallie Piles, PharmD 06/28/2018 8:24 AM

## 2018-06-29 ENCOUNTER — Telehealth: Payer: Self-pay

## 2018-06-29 DIAGNOSIS — I69328 Other speech and language deficits following cerebral infarction: Secondary | ICD-10-CM | POA: Diagnosis not present

## 2018-06-29 DIAGNOSIS — R4781 Slurred speech: Secondary | ICD-10-CM | POA: Diagnosis not present

## 2018-06-29 DIAGNOSIS — R296 Repeated falls: Secondary | ICD-10-CM | POA: Diagnosis not present

## 2018-06-29 DIAGNOSIS — N3000 Acute cystitis without hematuria: Secondary | ICD-10-CM | POA: Diagnosis not present

## 2018-06-29 DIAGNOSIS — I69354 Hemiplegia and hemiparesis following cerebral infarction affecting left non-dominant side: Secondary | ICD-10-CM | POA: Diagnosis not present

## 2018-06-29 DIAGNOSIS — E1140 Type 2 diabetes mellitus with diabetic neuropathy, unspecified: Secondary | ICD-10-CM | POA: Diagnosis not present

## 2018-06-29 NOTE — Telephone Encounter (Signed)
HFU scheduled for 07/04/18.

## 2018-06-29 NOTE — Telephone Encounter (Signed)
I have made the 1st attempt to contact the patient or family member in charge, in order to follow up from recently being discharged from the hospital. I left a message on voicemail requesting a CB. -MM 

## 2018-06-30 LAB — CULTURE, BLOOD (ROUTINE X 2)
Culture: NO GROWTH
Culture: NO GROWTH

## 2018-06-30 NOTE — Telephone Encounter (Signed)
Pt advised. Pt does not f/u with Dr Caryn Section until Monday, 07/04/18. Would it be ok to wait until then to decide about another medication option?

## 2018-06-30 NOTE — Telephone Encounter (Signed)
[facility] is a one time dose packet that was scheduled for 07-01-18. Urine culture isolated Enterococcus bacteria that is resistant to several antibiotics. If drinking plenty of fluids to flush out urinary tract, may decide after seeing Dr. Caryn Section tomorrow whether this or another antibiotic could be used.

## 2018-06-30 NOTE — Telephone Encounter (Signed)
Transition Care Management Follow-up Telephone Call  Date of discharge and from where: [facility] on 06/28/18.  How have you been since you were released from the hospital? Doing good and feels much better. Declines urinary s/s, weakness, fatigue, pain, fever or n/v/d.  Any questions or concerns? Yes, needs a subsitute to Monurol 3 g pack due to cost. Pt was prescribed this for a UTI while in the hospital.  Pt declines any current urinary s/s.  Items Reviewed:  Did the pt receive and understand the discharge instructions provided? Yes   Medications obtained and verified? Pt declined reviewing at this time.  Any new allergies since your discharge? No   Dietary orders reviewed? Yes  Do you have support at home? Yes   Other (ie: DME, Home Health, etc) Continuation of PT with [facility].  Functional Questionnaire: (I = Independent and D = Dependent)  Bathing/Dressing- I   Meal Prep- I  Eating- I  Maintaining continence- I  Transferring/Ambulation- I  Managing Meds- I   Follow up appointments reviewed:    PCP Hospital f/u appt confirmed? Yes  Scheduled to see Dr Caryn Section on 07/04/18 @ 3:40 PM.  [place] [facility] f/u appt confirmed? Yes    Are transportation arrangements needed? No   If their condition worsens, is the pt aware to call  their PCP or go to the ED? Yes  Was the patient provided with contact information for the PCP's office or ED? Yes  Was the pt encouraged to call back with questions or concerns? Yes

## 2018-06-30 NOTE — Telephone Encounter (Signed)
Should be alright since she is asymptomatic now. But will check with Dr. Caryn Section tomorrow for his opinion.

## 2018-07-01 DIAGNOSIS — E1122 Type 2 diabetes mellitus with diabetic chronic kidney disease: Secondary | ICD-10-CM | POA: Diagnosis not present

## 2018-07-01 DIAGNOSIS — R296 Repeated falls: Secondary | ICD-10-CM | POA: Diagnosis not present

## 2018-07-01 DIAGNOSIS — E1140 Type 2 diabetes mellitus with diabetic neuropathy, unspecified: Secondary | ICD-10-CM | POA: Diagnosis not present

## 2018-07-01 DIAGNOSIS — I69328 Other speech and language deficits following cerebral infarction: Secondary | ICD-10-CM | POA: Diagnosis not present

## 2018-07-01 DIAGNOSIS — R4781 Slurred speech: Secondary | ICD-10-CM | POA: Diagnosis not present

## 2018-07-01 DIAGNOSIS — N183 Chronic kidney disease, stage 3 (moderate): Secondary | ICD-10-CM | POA: Diagnosis not present

## 2018-07-01 DIAGNOSIS — I69354 Hemiplegia and hemiparesis following cerebral infarction affecting left non-dominant side: Secondary | ICD-10-CM | POA: Diagnosis not present

## 2018-07-01 DIAGNOSIS — N2581 Secondary hyperparathyroidism of renal origin: Secondary | ICD-10-CM | POA: Diagnosis not present

## 2018-07-01 DIAGNOSIS — I10 Essential (primary) hypertension: Secondary | ICD-10-CM | POA: Diagnosis not present

## 2018-07-01 DIAGNOSIS — N3000 Acute cystitis without hematuria: Secondary | ICD-10-CM | POA: Diagnosis not present

## 2018-07-01 NOTE — Telephone Encounter (Signed)
Advised patient as below.  

## 2018-07-01 NOTE — Telephone Encounter (Signed)
Finally got in contact with this patient and offered to give Amoxil or Ampicillin that was less expensive than the Monurol packet (states her insurance did not cover it and it would cost her $300 out-of-pocket). She denies any fever or UTI symptoms. Requested we postpone anymore antibiotics after being on so many in the hospital. She will keep the follow up appointment scheduled with Dr. Caryn Section on 07-04-18 to recheck urine.

## 2018-07-04 ENCOUNTER — Telehealth: Payer: Self-pay | Admitting: Family Medicine

## 2018-07-04 ENCOUNTER — Encounter: Payer: Self-pay | Admitting: Family Medicine

## 2018-07-04 ENCOUNTER — Ambulatory Visit (INDEPENDENT_AMBULATORY_CARE_PROVIDER_SITE_OTHER): Payer: Medicare Other | Admitting: Family Medicine

## 2018-07-04 ENCOUNTER — Other Ambulatory Visit: Payer: Self-pay

## 2018-07-04 VITALS — BP 91/53 | HR 74 | Temp 98.0°F | Wt 169.0 lb

## 2018-07-04 DIAGNOSIS — N760 Acute vaginitis: Secondary | ICD-10-CM | POA: Diagnosis not present

## 2018-07-04 DIAGNOSIS — I959 Hypotension, unspecified: Secondary | ICD-10-CM | POA: Diagnosis not present

## 2018-07-04 DIAGNOSIS — N3090 Cystitis, unspecified without hematuria: Secondary | ICD-10-CM | POA: Diagnosis not present

## 2018-07-04 LAB — POCT URINALYSIS DIPSTICK
Bilirubin, UA: NEGATIVE
Glucose, UA: NEGATIVE
Ketones, UA: NEGATIVE
Nitrite, UA: NEGATIVE
Protein, UA: NEGATIVE
Spec Grav, UA: 1.01 (ref 1.010–1.025)
Urobilinogen, UA: 0.2 E.U./dL
pH, UA: 5 (ref 5.0–8.0)

## 2018-07-04 MED ORDER — FLUCONAZOLE 150 MG PO TABS: 150.0000 mg | ORAL_TABLET | Freq: Once | ORAL | 0 refills | Status: AC

## 2018-07-04 NOTE — Progress Notes (Signed)
Patient: [name] Female    DOB: [birth year]   48 y.o.   MRN: [id number] Visit Date: 07/04/2018  Today's Provider: [name], MD   Chief Complaint  Patient presents with  . Hospitalization Follow-up   Subjective:     HPI     Follow up Hospitalization  Patient was admitted to [facility] on 06/25/2018 and discharged on 06/28/2018. She was treated for Hypotension, UTI, and Iron def anemia. Treatment for this included iv antibiotic and fluid rehydration. Telephone follow up was done on 06/29/2018 She reports excellent compliance with treatment. She reports this condition is completely resolved. .  Pt states she has a yeast infection now from all the antibiotics she had in the hospital. With her typical sx of vaginal itching and burning, but no dysuria.  She had culture positive for e. Faecalis, but u/a in the hospital was negative for nitrites. She was prescribed fosfomycin to take after discharge, but states she did not pick up the antibiotic for her UTI secondary to cost.  She also states she had follow up with Dr. Holley Raring ([facility]) last week and had labs drawn. Is still waiting for results.  ------------------------------------------------------------------------------------     Allergies  Allergen Reactions  . Lipitor [Atorvastatin] Other (See Comments)    Leg pains  . Tramadol Other (See Comments)    Mouth feels like it's on fire  . Lactose Intolerance (Gi) Nausea Only     Current Outpatient Medications:  .  ALPRAZolam (XANAX) 0.5 MG tablet, Take 0.5-1 tablets (0.25-0.5 mg total) by mouth 2 (two) times daily as needed for anxiety., Disp: 30 tablet, Rfl: 5 .  Blood Glucose Monitoring Suppl (ONE TOUCH ULTRA 2) w/Device KIT, Use to check blood sugar once a day. Dx. E11.9, Disp: 1 each, Rfl: 0 .  buPROPion (WELLBUTRIN XL) 150 MG 24 hr tablet, TAKE 1 TABLET BY MOUTH DAILY (Patient taking differently: Take 150 mg by mouth daily. ), Disp: 30 tablet, Rfl: 11 .   citalopram (CELEXA) 40 MG tablet, Take 1 tablet by mouth once daily, Disp: 30 tablet, Rfl: 4 .  clopidogrel (PLAVIX) 75 MG tablet, Take 75 mg by mouth daily., Disp: , Rfl:  .  diphenoxylate-atropine (LOMOTIL) 2.5-0.025 MG tablet, Take 1 tablet by mouth 4 (four) times daily as needed for diarrhea or loose stools., Disp: , Rfl:  .  gabapentin (NEURONTIN) 300 MG capsule, Take 1 capsule (300 mg total) by mouth 2 (two) times daily., Disp: 60 capsule, Rfl: 5 .  isosorbide mononitrate (IMDUR) 30 MG 24 hr tablet, Take 1 tablet (30 mg total) by mouth daily., Disp: 30 tablet, Rfl: 0 .  levothyroxine (SYNTHROID, LEVOTHROID) 25 MCG tablet, Take 1 tablet (25 mcg total) by mouth daily before breakfast., Disp: 30 tablet, Rfl: 12 .  metFORMIN (GLUCOPHAGE-XR) 500 MG 24 hr tablet, Take 500 mg by mouth daily., Disp: , Rfl:  .  metoprolol succinate (TOPROL XL) 25 MG 24 hr tablet, Take 0.5 tablets (12.5 mg total) by mouth daily., Disp: 30 tablet, Rfl: 11 .  nitroGLYCERIN (NITROSTAT) 0.4 MG SL tablet, Place 1 tablet (0.4 mg total) under the tongue every 5 (five) minutes as needed for chest pain., Disp: 25 tablet, Rfl: 2 .  ondansetron (ZOFRAN) 4 MG tablet, Take 1 tablet (4 mg total) by mouth every 8 (eight) hours as needed., Disp: 20 tablet, Rfl: 0 .  pantoprazole (PROTONIX) 40 MG tablet, Take 1 tablet (40 mg total) by mouth 2 (two) times daily., Disp:  60 tablet, Rfl: 5 .  rifaximin (XIFAXAN) 200 MG tablet, Take 1 tablet (200 mg total) by mouth 3 (three) times daily., Disp: 42 tablet, Rfl: 0 .  rosuvastatin (CRESTOR) 40 MG tablet, Take 1 tablet (40 mg total) by mouth daily., Disp: 90 tablet, Rfl: 3 .  tiZANidine (ZANAFLEX) 4 MG tablet, Take 4 mg by mouth daily as needed for pain., Disp: , Rfl:   Review of Systems  Constitutional: Positive for appetite change and fatigue. Negative for activity change, chills, diaphoresis, fever and unexpected weight change.  Respiratory: Negative.   Cardiovascular: Negative.    Gastrointestinal: Positive for diarrhea. Negative for abdominal distention, abdominal pain, anal bleeding, blood in stool, constipation, nausea, rectal pain and vomiting.  Genitourinary: Positive for vaginal discharge (Pt thinks she has a yeast infection.). Negative for decreased urine volume, difficulty urinating, dyspareunia, dysuria, enuresis, flank pain, frequency, genital sores, hematuria, menstrual problem, pelvic pain, urgency, vaginal bleeding and vaginal pain.  Neurological: Positive for light-headedness and headaches. Negative for dizziness.    Social History   Tobacco Use  . Smoking status: Former Smoker    Types: Cigarettes    Last attempt to quit: 08/31/1994    Years since quitting: 23.8  . Smokeless tobacco: Never Used  . Tobacco comment: quit 28 years ago  Substance Use Topics  . Alcohol use: No    Alcohol/week: 0.0 standard drinks      Objective:   BP (!) 91/53 (BP Location: Right Arm, Patient Position: Sitting, Cuff Size: Normal)   Pulse 74   Temp 98 F (36.7 C)   Wt 169 lb (76.7 kg)   BMI 30.91 kg/m     Physical Exam   General Appearance:    Alert, cooperative, no distress  Eyes:    PERRL, conjunctiva/corneas clear, EOM's intact       Lungs:     Clear to auscultation bilaterally, respirations unlabored  Heart:    Regular rate and rhythm  Neurologic:   Awake, alert, oriented x 3. No apparent focal neurological           defect.       Results for orders placed or performed in visit on 07/04/18  POCT urinalysis dipstick  Result Value Ref Range   Color, UA     Clarity, UA     Glucose, UA Negative Negative   Bilirubin, UA Negative    Ketones, UA Negative    Spec Grav, UA 1.010 1.010 - 1.025   Blood, UA Trace    pH, UA 5.0 5.0 - 8.0   Protein, UA Negative Negative   Urobilinogen, UA 0.2 0.2 or 1.0 E.U./dL   Nitrite, UA Negative    Leukocytes, UA Moderate (2+) (A) Negative   Appearance     Odor         Assessment & Plan    1. Hypotension,  unspecified hypotension type Symptoms have completely resolved.   2. Cystitis Culture from [facility] was likely contaminant. She is having some sx of yeas infection today as below, but otherwise no urinary sx. She feels very well. Check culture before starting anymore antibiotic.  - POCT urinalysis dipstick - CULTURE, URINE COMPREHENSIVE  3. Acute vaginitis Diflucan x 1.      [name], MD  [facility]

## 2018-07-04 NOTE — Telephone Encounter (Signed)
That's fine

## 2018-07-04 NOTE — Telephone Encounter (Signed)
Call was returned   [name]

## 2018-07-04 NOTE — Patient Instructions (Signed)
.   Please review the attached list of medications and notify my office if there are any errors.   . Please bring all of your medications to every appointment so we can make sure that our medication list is the same as yours.   

## 2018-07-04 NOTE — Telephone Encounter (Signed)
Tried calling but the mailbox is full.   Thanks,   -Mickel Baas

## 2018-07-04 NOTE — Telephone Encounter (Signed)
Amy with [facility] called asking for nursing orders to visit for low blood pressure  Please advise and you can leave a message  Con Memos

## 2018-07-04 NOTE — Telephone Encounter (Signed)
Tried calling again; the mailbox is full.  If Amy with [facility] calls back please give the verbal okay for nursing orders to visit for low blood pressure.   Thanks,   -Mickel Baas

## 2018-07-05 ENCOUNTER — Other Ambulatory Visit: Payer: Self-pay | Admitting: Family Medicine

## 2018-07-05 DIAGNOSIS — N3000 Acute cystitis without hematuria: Secondary | ICD-10-CM | POA: Diagnosis not present

## 2018-07-05 DIAGNOSIS — R296 Repeated falls: Secondary | ICD-10-CM | POA: Diagnosis not present

## 2018-07-05 DIAGNOSIS — R4781 Slurred speech: Secondary | ICD-10-CM | POA: Diagnosis not present

## 2018-07-05 DIAGNOSIS — N3090 Cystitis, unspecified without hematuria: Secondary | ICD-10-CM | POA: Diagnosis not present

## 2018-07-05 DIAGNOSIS — I69328 Other speech and language deficits following cerebral infarction: Secondary | ICD-10-CM | POA: Diagnosis not present

## 2018-07-05 DIAGNOSIS — I69354 Hemiplegia and hemiparesis following cerebral infarction affecting left non-dominant side: Secondary | ICD-10-CM | POA: Diagnosis not present

## 2018-07-05 DIAGNOSIS — E1140 Type 2 diabetes mellitus with diabetic neuropathy, unspecified: Secondary | ICD-10-CM | POA: Diagnosis not present

## 2018-07-05 NOTE — Telephone Encounter (Signed)
Left message advising Amy.  Thanks,   -Mickel Baas

## 2018-07-06 DIAGNOSIS — I69328 Other speech and language deficits following cerebral infarction: Secondary | ICD-10-CM | POA: Diagnosis not present

## 2018-07-06 DIAGNOSIS — R4781 Slurred speech: Secondary | ICD-10-CM | POA: Diagnosis not present

## 2018-07-06 DIAGNOSIS — I69354 Hemiplegia and hemiparesis following cerebral infarction affecting left non-dominant side: Secondary | ICD-10-CM | POA: Diagnosis not present

## 2018-07-06 DIAGNOSIS — R296 Repeated falls: Secondary | ICD-10-CM | POA: Diagnosis not present

## 2018-07-06 DIAGNOSIS — N3000 Acute cystitis without hematuria: Secondary | ICD-10-CM | POA: Diagnosis not present

## 2018-07-06 DIAGNOSIS — E1140 Type 2 diabetes mellitus with diabetic neuropathy, unspecified: Secondary | ICD-10-CM | POA: Diagnosis not present

## 2018-07-07 ENCOUNTER — Telehealth: Payer: Medicare Other | Admitting: Cardiovascular Disease

## 2018-07-07 ENCOUNTER — Other Ambulatory Visit: Payer: Self-pay

## 2018-07-07 DIAGNOSIS — R4781 Slurred speech: Secondary | ICD-10-CM | POA: Diagnosis not present

## 2018-07-07 DIAGNOSIS — I69328 Other speech and language deficits following cerebral infarction: Secondary | ICD-10-CM | POA: Diagnosis not present

## 2018-07-07 DIAGNOSIS — I69354 Hemiplegia and hemiparesis following cerebral infarction affecting left non-dominant side: Secondary | ICD-10-CM | POA: Diagnosis not present

## 2018-07-07 DIAGNOSIS — R296 Repeated falls: Secondary | ICD-10-CM | POA: Diagnosis not present

## 2018-07-07 DIAGNOSIS — N3000 Acute cystitis without hematuria: Secondary | ICD-10-CM | POA: Diagnosis not present

## 2018-07-07 DIAGNOSIS — E1140 Type 2 diabetes mellitus with diabetic neuropathy, unspecified: Secondary | ICD-10-CM | POA: Diagnosis not present

## 2018-07-07 LAB — CULTURE, URINE COMPREHENSIVE

## 2018-07-08 ENCOUNTER — Other Ambulatory Visit: Payer: Self-pay

## 2018-07-08 ENCOUNTER — Telehealth: Payer: Self-pay

## 2018-07-08 ENCOUNTER — Telehealth (INDEPENDENT_AMBULATORY_CARE_PROVIDER_SITE_OTHER): Payer: Medicare Other | Admitting: Nurse Practitioner

## 2018-07-08 ENCOUNTER — Encounter: Payer: Self-pay | Admitting: Nurse Practitioner

## 2018-07-08 VITALS — BP 90/51 | HR 42 | Ht 63.0 in | Wt 170.0 lb

## 2018-07-08 DIAGNOSIS — E785 Hyperlipidemia, unspecified: Secondary | ICD-10-CM

## 2018-07-08 DIAGNOSIS — E119 Type 2 diabetes mellitus without complications: Secondary | ICD-10-CM

## 2018-07-08 DIAGNOSIS — I952 Hypotension due to drugs: Secondary | ICD-10-CM

## 2018-07-08 DIAGNOSIS — I25118 Atherosclerotic heart disease of native coronary artery with other forms of angina pectoris: Secondary | ICD-10-CM

## 2018-07-08 DIAGNOSIS — R001 Bradycardia, unspecified: Secondary | ICD-10-CM

## 2018-07-08 DIAGNOSIS — N183 Chronic kidney disease, stage 3 unspecified: Secondary | ICD-10-CM

## 2018-07-08 MED ORDER — METOPROLOL SUCCINATE ER 25 MG PO TB24: 12.5000 mg | ORAL_TABLET | Freq: Every day | ORAL | Status: DC

## 2018-07-08 MED ORDER — CLOPIDOGREL BISULFATE 75 MG PO TABS: 75.0000 mg | ORAL_TABLET | Freq: Every day | ORAL | 3 refills | Status: DC

## 2018-07-08 NOTE — Telephone Encounter (Signed)
-----   Message from [name], MD sent at 07/08/2018  8:10 AM EDT ----- Urine culture is negative. No need for antibiotic.

## 2018-07-08 NOTE — Telephone Encounter (Signed)
LMTCB 07/08/2018  Thanks,   -Mickel Baas

## 2018-07-08 NOTE — Progress Notes (Signed)
Virtual Visit via Video Note   This visit type was conducted due to national recommendations for restrictions regarding the COVID-19 Pandemic (e.g. social distancing) in an effort to limit this patient's exposure and mitigate transmission in our community.  Due to her co-morbid illnesses, this patient is at least at moderate risk for complications without adequate follow up.  This format is felt to be most appropriate for this patient at this time.  All issues noted in this document were discussed and addressed.  A limited physical exam was performed with this format.  Please refer to the patient's chart for her consent to telehealth for [facility]. Evaluation Performed:  Follow-up visit  This visit type was conducted due to national recommendations for restrictions regarding the COVID-19 Pandemic (e.g. social distancing).  This format is felt to be most appropriate for this patient at this time.  All issues noted in this document were discussed and addressed.  No physical exam was performed (except for noted visual exam findings with Video Visits).  Please refer to the patient's chart (MyChart message for video visits and phone note for telephone visits) for the patient's consent to telehealth for [facility]. _____________   Date:  07/08/2018   Patient ID:  [address] DOB [birth year], MRN [id number] Patient Location:  Home Provider location:   Office  Primary Care Provider:  [name], MD Primary Cardiologist:  Ida Rogue, MD  Chief Complaint    48 year old female with a history of CAD status post prior interventions, hypertension, hyperlipidemia, diabetes, stage III chronic kidney disease, prior stroke, obesity status post gastric bypass, prior left carotid to subclavian artery bypass with subclavian artery ligation, anemia, bipolar disorder, and aortic arch aneurysm, who presents for follow-up hypotension.  Past Medical History    Past Medical History:    Diagnosis Date   Acute pyelonephritis    Anemia    iron deficiency anemia   Aortic arch aneurysm ([place])    Bipolar disorder ([place])    BRCA negative 2014   CAD (coronary artery disease)    a. 08/2003 Cath: LAD 30-40-med Rx; b. 11/2014 PCI: LAD 55m(3.25x23 Xience Alpine DES); c. 06/2015 PCI: D1 (2.25x12 Resolute Integrity DES); d. 06/2017 PCI: Patent mLAD stent, D2 95 (PTCA); e. 09/2017 PCI: D2 99ost (CBA); d. 12/2017 Cath: LM nl, LAD 360m80d (small), D1 40ost, D2 95ost, LCX 40p, RCA 40ost/p->Med rx for D2 given restenosis.   CKD (chronic kidney disease), stage III (HCC)    Colon polyp    CVA (cerebral vascular accident) ([place]   Left side weakness.    Diabetes ([place]   Family history of breast cancer    BRCA neg 2014   Gastric ulcer 04/27/2011   History of echocardiogram    a. 03/2017 Echo: EF 60-65%, no rwma; b. 02/2018 Echo: EF 60-65%, no rwma. Nl RV fxn. No cardiac source of emboli (admitted w/ stroke).   HTN (hypertension)    Hyperlipemia    Hypothyroid    Malignant melanoma of skin of scalp (HCC)    MI, acute, non ST segment elevation (HCC)    Neuromuscular disorder (HCC)    Orthostatic hypotension    S/P drug eluting coronary stent placement 06/04/2015   Sepsis ([place]/14/2019   Stroke ([place]   a. 02/2018 MRI: 32m74mate acute/early subacute L medial frontal lobe inarct; b. 02/2018 MRA No large vessel occlusion or aneurysm. Mod to sev L P2 stenosis. thready L vertebral artery, diffusely dzs'd;  c. 02/2018 Carotid U/S: <50% bilat ICA dzs.   Past Surgical History:  Procedure Laterality Date   APPENDECTOMY     CARDIAC CATHETERIZATION N/A 11/09/2014   Procedure: Coronary Angiography;  Surgeon: Minna Merritts, MD;  Location: [place] [facility];  Service: Cardiovascular;  Laterality: N/A;   CARDIAC CATHETERIZATION N/A 11/12/2014   Procedure: Coronary Stent Intervention;  Surgeon: Isaias Cowman, MD;  Location: [place] CV LAB;  Service: Cardiovascular;   Laterality: N/A;   CARDIAC CATHETERIZATION N/A 04/18/2015   Procedure: Left Heart Cath and Coronary Angiography;  Surgeon: Minna Merritts, MD;  Location: [place] CV LAB;  Service: Cardiovascular;  Laterality: N/A;   CARDIAC CATHETERIZATION Left 06/04/2015   Procedure: Left Heart Cath and Coronary Angiography;  Surgeon: Wellington Hampshire, MD;  Location: [place] CV LAB;  Service: Cardiovascular;  Laterality: Left;   CARDIAC CATHETERIZATION N/A 06/04/2015   Procedure: Coronary Stent Intervention;  Surgeon: Wellington Hampshire, MD;  Location: [place] [facility];  Service: Cardiovascular;  Laterality: N/A;   CESAREAN SECTION  2001   CHOLECYSTECTOMY N/A 11/18/2016   Procedure: LAPAROSCOPIC CHOLECYSTECTOMY WITH INTRAOPERATIVE CHOLANGIOGRAM;  Surgeon: Christene Lye, MD;  Location: [facility];  Service: General;  Laterality: N/A;   COLONOSCOPY WITH PROPOFOL N/A 04/27/2016   Procedure: COLONOSCOPY WITH PROPOFOL;  Surgeon: [name], MD;  Location: [place];  Service: Endoscopy;  Laterality: N/A;   COLONOSCOPY WITH PROPOFOL N/A 01/12/2018   Procedure: COLONOSCOPY WITH PROPOFOL;  Surgeon: Toledo, Benay Pike, MD;  Location: [facility];  Service: Endoscopy;  Laterality: N/A;   CORONARY ANGIOPLASTY     CORONARY BALLOON ANGIOPLASTY N/A 06/29/2017   Procedure: CORONARY BALLOON ANGIOPLASTY;  Surgeon: Wellington Hampshire, MD;  Location: [place] CV [facility];  Service: Cardiovascular;  Laterality: N/A;   CORONARY BALLOON ANGIOPLASTY N/A 09/20/2017   Procedure: CORONARY BALLOON ANGIOPLASTY;  Surgeon: Wellington Hampshire, MD;  Location: [place] [facility];  Service: Cardiovascular;  Laterality: N/A;   DILATION AND CURETTAGE OF UTERUS     ESOPHAGOGASTRODUODENOSCOPY (EGD) WITH PROPOFOL N/A 09/14/2014   Procedure: ESOPHAGOGASTRODUODENOSCOPY (EGD) WITH PROPOFOL;  Surgeon: Josefine Class, MD;  Location: [facility];  Service: Endoscopy;  Laterality: N/A;    ESOPHAGOGASTRODUODENOSCOPY (EGD) WITH PROPOFOL N/A 04/27/2016   Procedure: ESOPHAGOGASTRODUODENOSCOPY (EGD) WITH PROPOFOL;  Surgeon: [name], MD;  Location: [place];  Service: Endoscopy;  Laterality: N/A;  Diabetic - oral meds   ESOPHAGOGASTRODUODENOSCOPY (EGD) WITH PROPOFOL N/A 01/12/2018   Procedure: ESOPHAGOGASTRODUODENOSCOPY (EGD) WITH PROPOFOL;  Surgeon: Toledo, Benay Pike, MD;  Location: [facility];  Service: Endoscopy;  Laterality: N/A;   GASTRIC BYPASS  09/2009   [facility]    Left Carotid to sublcavian artery bypass w/ subclavian artery ligation     a. Performed @ [facility].   LEFT HEART CATH AND CORONARY ANGIOGRAPHY Left 06/29/2017   Procedure: LEFT HEART CATH AND CORONARY ANGIOGRAPHY;  Surgeon: Wellington Hampshire, MD;  Location: [place] CV LAB;  Service: Cardiovascular;  Laterality: Left;   LEFT HEART CATH AND CORONARY ANGIOGRAPHY N/A 09/20/2017   Procedure: LEFT HEART CATH AND CORONARY ANGIOGRAPHY;  Surgeon: Wellington Hampshire, MD;  Location: [place] [facility];  Service: Cardiovascular;  Laterality: N/A;   LEFT HEART CATH AND CORONARY ANGIOGRAPHY N/A 12/20/2017   Procedure: LEFT HEART CATH AND CORONARY ANGIOGRAPHY;  Surgeon: Wellington Hampshire, MD;  Location: [place] CV [facility];  Service: Cardiovascular;  Laterality: N/A;   MELANOMA EXCISION  2016   Dr. Evorn Gong  NOVASURE ABLATION  2002   RIGHT OOPHORECTOMY     SHOULDER ARTHROSCOPY WITH OPEN ROTATOR CUFF REPAIR Right 01/07/2016   Procedure: SHOULDER ARTHROSCOPY WITH DEBRIDMENT, SUBACHROMIAL DECOMPRESSION;  Surgeon: Corky Mull, MD;  Location: [facility];  Service: Orthopedics;  Laterality: Right;   SHOULDER ARTHROSCOPY WITH OPEN ROTATOR CUFF REPAIR Right 03/16/2017   Procedure: SHOULDER ARTHROSCOPY WITH OPEN ROTATOR CUFF REPAIR POSSIBLE BICEPS TENODESIS;  Surgeon: Corky Mull, MD;  Location: [facility];  Service: Orthopedics;  Laterality: Right;   TRIGGER FINGER RELEASE Right      Middle Finger    Allergies  Allergies  Allergen Reactions   Lipitor [Atorvastatin] Other (See Comments)    Leg pains   Tramadol Other (See Comments)    Mouth feels like it's on fire   Lactose Intolerance (Gi) Nausea Only    History of Present Illness    Vaniya Marita [place] [name] is a 48 y.o. female who presents via audio/video conferencing for a telehealth visit today.As above, she has a history of CAD status post prior interventions, hypertension, hyperlipidemia, type 2 diabetes mellitus, stage III chronic kidney disease, prior stroke, obesity status post gastric bypass, prior left carotid to subclavian artery bypass with subclavian artery ligation, anemia, bipolar disorder, and aortic arch aneurysm.  She was initially diagnosed with CAD in July 2005 at which time catheterization showed moderate LAD stenosis, which was medically managed.  In October 2016, she required PCI and drug-eluting stent placement to her mid LAD.  In May 2017, catheterization revealed patency of the LAD stent but she had developed significant first diagonal disease and this was successfully treated with a drug-eluting stent.  In May 2019, catheterization showed severe stenosis in the second diagonal and this was treated with balloon angioplasty.  She had recurrent discomfort in late July 2019 requiring admission.  Again, catheterization revealed stenosis in the diagonal branch and this was treated with Cutting Balloon angioplasty.  In November 2019, she had recurrent chest pain was once again found to have 95% ostial stenosis/restenosis in the second diagonal with significant apical LAD disease.  Given restenosis within the diagonal, was felt that medical therapy was warranted and she was maintained on aspirin, statin, and moderate to high-dose nitrate therapy.  She suffered a left medial frontal lobe infarct in January, when she presented with slurring of her speech and weakness.  No large vessel stenosis was noted.  Echo  showed normal LV function without cardiac source for emboli.  She was managed with aspirin and Plavix and amlodipine therapy was reduced as it was felt that hypotension may have contributed to symptoms.    She was last seen via virtual visit on April 2.  At that time, she reported having had near syncopal episodes and her metoprolol was reduced to 25 mg daily out of concern for possible bradycardia.  Since then, she has been admitted twice.  In early May, she was admitted with dizziness and hypotension in the setting of UTI and sepsis.  Beta-blocker and nitrate therapy were reduced.  She was readmitted about a week later with weakness, altered mental status, and recurrent hypotension that improved with fluid bolus.  She then developed hypertensive urgency, which responded to resumption of low-dose beta-blocker, losartan, and nitrate therapy.  During that admission, she was again found to have a urinary tract infection requiring antibiotics (enterococcus).  At primary care follow-up on June 1, blood pressure was soft but she was asymptomatic (91/53).  In the setting of previous antibiotics for presumed  urinary tract infections, she was experiencing symptoms of a yeast infection and she was treated with Diflucan.  Today, she says that she has mostly been feeling well. Still w/ occasional c/p, but overall this has been stable on imdur therapy.  She has had a few lightheaded episodes after standing, but overall she has been doing better.  BP cont to trend low - 90's to 100's.  She tells me today that her BP cuff has been recording HRs in the 40's at times.  HR @ PCP visit the other day was 74.  Though her med list said she was taking 12.5 of toprol, she says, she is actually taking 84m.  She denies palpitations, dyspnea, pnd, orthopnea, n, v, syncope, edema, weight gain, or early satiety.   The patient does not have symptoms concerning for COVID-19 infection (fever, chills, cough, or new shortness of breath).    Home Medications    Prior to Admission medications   Medication Sig Start Date End Date Taking? Authorizing Provider  ALPRAZolam (Duanne Moron 0.5 MG tablet Take 0.5-1 tablets (0.25-0.5 mg total) by mouth 2 (two) times daily as needed for anxiety. Patient not taking: Reported on 07/04/2018 05/27/18   FBirdie Sons MD  ALPRAZolam (Duanne Moron 1 MG tablet Take 1 mg by mouth 2 (two) times daily.    [provider]  Blood Glucose Monitoring Suppl (ONE TOUCH ULTRA 2) w/Device KIT Use to check blood sugar once a day. Dx. E11.9 03/18/17   FBirdie Sons MD  buPROPion (WELLBUTRIN XL) 150 MG 24 hr tablet TAKE 1 TABLET BY MOUTH DAILY Patient taking differently: Take 150 mg by mouth daily.  08/10/17   FBirdie Sons MD  citalopram (CELEXA) 40 MG tablet Take 1 tablet by mouth once daily 06/24/18   FBirdie Sons MD  clopidogrel (PLAVIX) 75 MG tablet Take 75 mg by mouth daily.    [provider]  diphenoxylate-atropine (LOMOTIL) 2.5-0.025 MG tablet Take 1 tablet by mouth 4 (four) times daily as needed for diarrhea or loose stools. 06/10/18   [provider]  gabapentin (NEURONTIN) 300 MG capsule TAKE 1 CAPSULE BY MOUTH TWICE A DAY 07/05/18   FBirdie Sons MD  isosorbide mononitrate (IMDUR) 30 MG 24 hr tablet Take 1 tablet (30 mg total) by mouth daily. 06/17/18   KEpifanio Lesches MD  levothyroxine (SYNTHROID, LEVOTHROID) 25 MCG tablet Take 1 tablet (25 mcg total) by mouth daily before breakfast. 03/28/18   FBirdie Sons MD  losartan (COZAAR) 25 MG tablet Take 25 mg by mouth daily.    [provider]  metFORMIN (GLUCOPHAGE-XR) 500 MG 24 hr tablet Take 500 mg by mouth 2 (two) times daily.  05/20/18   [provider]  metoprolol succinate (TOPROL XL) 25 MG 24 hr tablet Take 0.5 tablets (12.5 mg total) by mouth daily. Patient taking differently: Take 25 mg by mouth daily.  06/16/18 06/16/19  KEpifanio Lesches MD  nitroGLYCERIN (NITROSTAT) 0.4 MG SL tablet Place 1  tablet (0.4 mg total) under the tongue every 5 (five) minutes as needed for chest pain. 06/02/17   GMinna Merritts MD  ondansetron (ZOFRAN) 4 MG tablet Take 1 tablet (4 mg total) by mouth every 8 (eight) hours as needed. 04/29/18   BMar Daring PA-C  pantoprazole (PROTONIX) 40 MG tablet Take 1 tablet (40 mg total) by mouth 2 (two) times daily. 03/02/18   FBirdie Sons MD  rosuvastatin (CRESTOR) 40 MG tablet Take 1 tablet (40 mg total)  by mouth daily. 03/01/18 06/14/18  Theora Gianotti, NP  tiZANidine (ZANAFLEX) 4 MG tablet Take 4 mg by mouth daily as needed for pain. 03/07/18   [provider]    Review of Systems    Occasional c/p but overall burden improved.  Occas orthostatic LH, but better w/ reduction of meds and d/c of amlodipine.  All other systems reviewed and are otherwise negative except as noted above.  Physical Exam    Vital Signs:  BP (!) 90/51 (BP Location: Left Arm, Patient Position: Sitting, Cuff Size: Normal)    Pulse (!) 42    Ht '5\' 3"'  (1.6 m)    Wt 170 lb (77.1 kg)    BMI 30.11 kg/m    Well nourished, well developed female in no acute distress.  AAOx3.  HEENT grossly nl.  Resp reg and unlabored.  Accessory Clinical Findings    Lab Results  Component Value Date   WBC 9.7 06/26/2018   HGB 8.0 (L) 06/27/2018   HCT 28.1 (L) 06/26/2018   MCV 64.2 (L) 06/26/2018   PLT 164 06/26/2018    Lab Results  Component Value Date   CREATININE 1.59 (H) 06/28/2018   BUN 11 06/28/2018   NA 141 06/28/2018   K 3.9 06/28/2018   CL 107 06/28/2018   CO2 24 06/28/2018    Lab Results  Component Value Date   CHOL 80 06/15/2018   HDL 31 (L) 06/15/2018   LDLCALC 31 06/15/2018   LDLDIRECT 83.3 01/26/2011   TRIG 91 06/15/2018   CHOLHDL 2.6 06/15/2018    Lab Results  Component Value Date   ALT 17 06/25/2018   AST 25 06/25/2018   ALKPHOS 96 06/25/2018   BILITOT 0.4 06/25/2018     Assessment & Plan    1.  CAD: s/p multiple interventions w/ cath in  12/2017 showing recurrent restenosis w/in the D2 along w/ apical LAD dzs.  She has been medically managed w/ asa, plavix, nitrate,  blocker, ARB, and statin rx.  Both  blocker and nitrate dosing has been reduced 2/2 hypotension.  Overall, her c/p has been reasonably well managed and less frequent than usual.  In the setting of bradycardia and ongoing relative hypotension, I am going to reduce her toprol to 12.41m daily (was taking 260mdaily).    2.  Essential HTN/Orthostatic hypotension:  Improved but still w/ soft bp's.  Reducing toprol to 12.5 daily as outlined above.  She will cont to follow @ home.  3.  Bradycardia:  She reports HRs sometimes in the 40's on her bp cuff.  She was not having bradycardia during either of her recent hospitalizations or at recent PCP visit.  She has HHNodawayhat comes out to her house and she is going to ask her to check her pulse and compare to what her bp cuff is registering.  I wonder if she is having ectopy that the cuff is not picking up.  Regardless, in the setting of relative hypotension, I am reducing  blocker.  If bradycardia documented by nsg staff, will need to d/c and would likely need to place monitor.  4.  HL:  Compliant w/ crestor.  LDL @ goal.  5.  CKD III:  On losartan and followed by nephrology.  Not currently req lasix.  6.  DMII:  A1c 5.8 in Jan. On metformin and followed by pcp.  7.  Dispo:  F/u w/ Dr. GoRockey Situn 3 mos.  COVID-19 Education: The signs and symptoms of  COVID-19 were discussed with the patient and how to seek care for testing (follow up with PCP or arrange E-visit).  The importance of social distancing was discussed today.  Patient Risk:   After full review of this patient's history and clinical status, I feel that he is at least moderate risk for cardiac complications at this time, thus necessitating a telehealth visit sooner than our first available in office visit.  Time:   Today, I have spent 18 minutes with the patient with  telehealth technology discussing medical history, symptoms, and management plan.     Murray Hodgkins, NP 07/08/2018, 3:53 PM

## 2018-07-08 NOTE — Patient Instructions (Addendum)
Medication Instructions:  - Your physician has recommended you make the following change in your medication:   1) Decrease metoprolol succinate 25 mg- take 1/2 tablet (12.5 mg) by mouth once daily  2) Plavix has been refilled  If you need a refill on your cardiac medications before your next appointment, please call your pharmacy.   Lab work: - none ordered  If you have labs (blood work) drawn today and your tests are completely normal, you will receive your results only by: Marland Kitchen MyChart Message (if you have MyChart) OR . A paper copy in the mail If you have any lab test that is abnormal or we need to change your treatment, we will call you to review the results.  Testing/Procedures: - none ordered  Follow-Up: At [facility], you and your health needs are our priority.  As part of our continuing mission to provide you with exceptional heart care, we have created designated Provider Care Teams.  These Care Teams include your primary Cardiologist (physician) and Advanced Practice Providers (APPs -  Physician Assistants and Nurse Practitioners) who all work together to provide you with the care you need, when you need it.  You will need a follow up appointment in 3 months (September)  Please call our office 2 months in advance to schedule this appointment. (call in late July to schedule)  You may see Ida Rogue, MD or one of the following Advanced Practice Providers on your designated Care Team:   Murray Hodgkins, NP Christell Faith, PA-C . Marrianne Mood, PA-C  Any Other Special Instructions Will Be Listed Below (If Applicable). - N/A

## 2018-07-11 DIAGNOSIS — E1140 Type 2 diabetes mellitus with diabetic neuropathy, unspecified: Secondary | ICD-10-CM | POA: Diagnosis not present

## 2018-07-11 DIAGNOSIS — R296 Repeated falls: Secondary | ICD-10-CM | POA: Diagnosis not present

## 2018-07-11 DIAGNOSIS — R4781 Slurred speech: Secondary | ICD-10-CM | POA: Diagnosis not present

## 2018-07-11 DIAGNOSIS — I69328 Other speech and language deficits following cerebral infarction: Secondary | ICD-10-CM | POA: Diagnosis not present

## 2018-07-11 DIAGNOSIS — I69354 Hemiplegia and hemiparesis following cerebral infarction affecting left non-dominant side: Secondary | ICD-10-CM | POA: Diagnosis not present

## 2018-07-11 DIAGNOSIS — N3000 Acute cystitis without hematuria: Secondary | ICD-10-CM | POA: Diagnosis not present

## 2018-07-11 NOTE — Telephone Encounter (Signed)
Patient advised and verbalized understanding. She says she has a yeast infection from all the antibiotics she was on in the hospital. She states it is almost resolved, but she would like a refill of Diflucan sent in. Patient wants to know If Dr. Caryn Section will send in a refill for Diflucan.  [facility] [address]

## 2018-07-12 MED ORDER — FLUCONAZOLE 100 MG PO TABS: 100.0000 mg | ORAL_TABLET | Freq: Every day | ORAL | 0 refills | Status: AC

## 2018-07-13 ENCOUNTER — Telehealth: Payer: Self-pay

## 2018-07-13 NOTE — Telephone Encounter (Signed)
Patient states she needs something for anxiety because her father is in ICU. She feels like she is unable to cope with the situation.

## 2018-07-13 NOTE — Telephone Encounter (Signed)
Does pt need to be seen in the office for this?   Thanks,   -Mickel Baas

## 2018-07-14 MED ORDER — ALPRAZOLAM 1 MG PO TABS: 1.0000 mg | ORAL_TABLET | Freq: Three times a day (TID) | ORAL | 0 refills | Status: AC | PRN

## 2018-07-14 NOTE — Telephone Encounter (Signed)
Have sent refill for alprazolam, she can take it up to 3 times a day if she needs to

## 2018-07-17 DIAGNOSIS — I959 Hypotension, unspecified: Secondary | ICD-10-CM | POA: Diagnosis not present

## 2018-07-17 DIAGNOSIS — R296 Repeated falls: Secondary | ICD-10-CM | POA: Diagnosis not present

## 2018-07-17 DIAGNOSIS — Z9181 History of falling: Secondary | ICD-10-CM | POA: Diagnosis not present

## 2018-07-17 DIAGNOSIS — B952 Enterococcus as the cause of diseases classified elsewhere: Secondary | ICD-10-CM | POA: Diagnosis not present

## 2018-07-17 DIAGNOSIS — Z955 Presence of coronary angioplasty implant and graft: Secondary | ICD-10-CM | POA: Diagnosis not present

## 2018-07-17 DIAGNOSIS — I69328 Other speech and language deficits following cerebral infarction: Secondary | ICD-10-CM | POA: Diagnosis not present

## 2018-07-17 DIAGNOSIS — Z87891 Personal history of nicotine dependence: Secondary | ICD-10-CM | POA: Diagnosis not present

## 2018-07-17 DIAGNOSIS — Z7902 Long term (current) use of antithrombotics/antiplatelets: Secondary | ICD-10-CM | POA: Diagnosis not present

## 2018-07-17 DIAGNOSIS — R4781 Slurred speech: Secondary | ICD-10-CM | POA: Diagnosis not present

## 2018-07-17 DIAGNOSIS — I2510 Atherosclerotic heart disease of native coronary artery without angina pectoris: Secondary | ICD-10-CM | POA: Diagnosis not present

## 2018-07-17 DIAGNOSIS — N3000 Acute cystitis without hematuria: Secondary | ICD-10-CM | POA: Diagnosis not present

## 2018-07-17 DIAGNOSIS — Z7984 Long term (current) use of oral hypoglycemic drugs: Secondary | ICD-10-CM | POA: Diagnosis not present

## 2018-07-17 DIAGNOSIS — G43909 Migraine, unspecified, not intractable, without status migrainosus: Secondary | ICD-10-CM | POA: Diagnosis not present

## 2018-07-17 DIAGNOSIS — E1122 Type 2 diabetes mellitus with diabetic chronic kidney disease: Secondary | ICD-10-CM | POA: Diagnosis not present

## 2018-07-17 DIAGNOSIS — I130 Hypertensive heart and chronic kidney disease with heart failure and stage 1 through stage 4 chronic kidney disease, or unspecified chronic kidney disease: Secondary | ICD-10-CM | POA: Diagnosis not present

## 2018-07-17 DIAGNOSIS — Z9884 Bariatric surgery status: Secondary | ICD-10-CM | POA: Diagnosis not present

## 2018-07-17 DIAGNOSIS — I509 Heart failure, unspecified: Secondary | ICD-10-CM | POA: Diagnosis not present

## 2018-07-17 DIAGNOSIS — E1140 Type 2 diabetes mellitus with diabetic neuropathy, unspecified: Secondary | ICD-10-CM | POA: Diagnosis not present

## 2018-07-17 DIAGNOSIS — I69354 Hemiplegia and hemiparesis following cerebral infarction affecting left non-dominant side: Secondary | ICD-10-CM | POA: Diagnosis not present

## 2018-07-17 DIAGNOSIS — N183 Chronic kidney disease, stage 3 (moderate): Secondary | ICD-10-CM | POA: Diagnosis not present

## 2018-07-27 DIAGNOSIS — R4781 Slurred speech: Secondary | ICD-10-CM | POA: Diagnosis not present

## 2018-07-27 DIAGNOSIS — I69328 Other speech and language deficits following cerebral infarction: Secondary | ICD-10-CM | POA: Diagnosis not present

## 2018-07-27 DIAGNOSIS — I69354 Hemiplegia and hemiparesis following cerebral infarction affecting left non-dominant side: Secondary | ICD-10-CM | POA: Diagnosis not present

## 2018-07-27 DIAGNOSIS — R296 Repeated falls: Secondary | ICD-10-CM | POA: Diagnosis not present

## 2018-07-27 DIAGNOSIS — E1140 Type 2 diabetes mellitus with diabetic neuropathy, unspecified: Secondary | ICD-10-CM | POA: Diagnosis not present

## 2018-07-27 DIAGNOSIS — I959 Hypotension, unspecified: Secondary | ICD-10-CM | POA: Diagnosis not present

## 2018-08-03 DIAGNOSIS — E1140 Type 2 diabetes mellitus with diabetic neuropathy, unspecified: Secondary | ICD-10-CM | POA: Diagnosis not present

## 2018-08-03 DIAGNOSIS — I959 Hypotension, unspecified: Secondary | ICD-10-CM | POA: Diagnosis not present

## 2018-08-03 DIAGNOSIS — R4781 Slurred speech: Secondary | ICD-10-CM | POA: Diagnosis not present

## 2018-08-03 DIAGNOSIS — I69328 Other speech and language deficits following cerebral infarction: Secondary | ICD-10-CM | POA: Diagnosis not present

## 2018-08-03 DIAGNOSIS — I69354 Hemiplegia and hemiparesis following cerebral infarction affecting left non-dominant side: Secondary | ICD-10-CM | POA: Diagnosis not present

## 2018-08-03 DIAGNOSIS — R296 Repeated falls: Secondary | ICD-10-CM | POA: Diagnosis not present

## 2018-08-08 ENCOUNTER — Encounter: Payer: Self-pay | Admitting: Family Medicine

## 2018-08-08 ENCOUNTER — Ambulatory Visit (INDEPENDENT_AMBULATORY_CARE_PROVIDER_SITE_OTHER): Payer: Medicare Other | Admitting: Family Medicine

## 2018-08-08 ENCOUNTER — Other Ambulatory Visit: Payer: Self-pay

## 2018-08-08 VITALS — BP 118/72 | HR 65 | Temp 98.6°F | Resp 16 | Wt 164.0 lb

## 2018-08-08 DIAGNOSIS — F419 Anxiety disorder, unspecified: Secondary | ICD-10-CM

## 2018-08-08 DIAGNOSIS — R4586 Emotional lability: Secondary | ICD-10-CM | POA: Diagnosis not present

## 2018-08-08 DIAGNOSIS — F333 Major depressive disorder, recurrent, severe with psychotic symptoms: Secondary | ICD-10-CM | POA: Diagnosis not present

## 2018-08-08 DIAGNOSIS — I255 Ischemic cardiomyopathy: Secondary | ICD-10-CM

## 2018-08-08 MED ORDER — VENLAFAXINE HCL ER 37.5 MG PO CP24: 37.5000 mg | ORAL_CAPSULE | Freq: Every day | ORAL | 0 refills | Status: DC

## 2018-08-08 MED ORDER — CITALOPRAM HYDROBROMIDE 40 MG PO TABS: ORAL_TABLET | ORAL | 0 refills | Status: DC

## 2018-08-08 NOTE — Patient Instructions (Addendum)
.   Wean Celexa (citalopram) as follows Take 2 tablets daily for 10 days, then 1 tablet daily for 10 days, then discontinue   Start Effexor, venlafaxine and titrate up on the dose as follows. Take 1 tablets daily for 10 days, then 2 tablets daily   Apply neosporin ointment and a Band-Aid to your sore toe daily until healed. Call my office it gets red, painful, or swollen.

## 2018-08-08 NOTE — Progress Notes (Signed)
Patient: [name] Female    DOB: 06/27/1970   48 y.o.   MRN: [id number] Visit Date: 08/08/2018  Today's Provider: [name], MD   Chief Complaint  Patient presents with  . Follow-up   Subjective:     HPI  Follow up for Depression:  The patient was last seen for this more than 1 months ago. Changes made at last visit include advising patient to Alprazolam up to 3 times a day if need.   She reports fair compliance with treatment. Patient has been taking Alprazolam twice daily along with Wellbutrin and Celexa.  She feels that condition is Worse. She is not having side effects. States she is very irritable, and has very little interest or motivation. Has not noticed any hot flashes or other menopausal symptoms. Does feel anxious.   ------------------------------------------------------------------------------------  Allergies  Allergen Reactions  . Lipitor [Atorvastatin] Other (See Comments)    Leg pains  . Tramadol Other (See Comments)    Mouth feels like it's on fire  . Lactose Intolerance (Gi) Nausea Only     Current Outpatient Medications:  .  ALPRAZolam (XANAX) 1 MG tablet, Take 1 mg by mouth 2 (two) times daily as needed for anxiety., Disp: , Rfl:  .  Blood Glucose Monitoring Suppl (ONE TOUCH ULTRA 2) w/Device KIT, Use to check blood sugar once a day. Dx. E11.9, Disp: 1 each, Rfl: 0 .  buPROPion (WELLBUTRIN XL) 150 MG 24 hr tablet, TAKE 1 TABLET BY MOUTH DAILY (Patient taking differently: Take 150 mg by mouth daily. ), Disp: 30 tablet, Rfl: 11 .  citalopram (CELEXA) 40 MG tablet, Take 1 tablet by mouth once daily, Disp: 30 tablet, Rfl: 4 .  clopidogrel (PLAVIX) 75 MG tablet, Take 1 tablet (75 mg total) by mouth daily., Disp: 90 tablet, Rfl: 3 .  diphenoxylate-atropine (LOMOTIL) 2.5-0.025 MG tablet, Take 1 tablet by mouth 4 (four) times daily as needed for diarrhea or loose stools., Disp: , Rfl:  .  gabapentin (NEURONTIN) 300 MG capsule, TAKE 1  CAPSULE BY MOUTH TWICE A DAY, Disp: 180 capsule, Rfl: 4 .  isosorbide mononitrate (IMDUR) 30 MG 24 hr tablet, Take 1 tablet (30 mg total) by mouth daily., Disp: 30 tablet, Rfl: 0 .  levothyroxine (SYNTHROID, LEVOTHROID) 25 MCG tablet, Take 1 tablet (25 mcg total) by mouth daily before breakfast., Disp: 30 tablet, Rfl: 12 .  losartan (COZAAR) 25 MG tablet, Take 25 mg by mouth daily., Disp: , Rfl:  .  metFORMIN (GLUCOPHAGE-XR) 500 MG 24 hr tablet, Take 500 mg by mouth 2 (two) times daily. , Disp: , Rfl:  .  metoprolol succinate (TOPROL XL) 25 MG 24 hr tablet, Take 0.5 tablets (12.5 mg total) by mouth daily., Disp: , Rfl:  .  nitroGLYCERIN (NITROSTAT) 0.4 MG SL tablet, Place 1 tablet (0.4 mg total) under the tongue every 5 (five) minutes as needed for chest pain., Disp: 25 tablet, Rfl: 2 .  ondansetron (ZOFRAN) 4 MG tablet, Take 1 tablet (4 mg total) by mouth every 8 (eight) hours as needed., Disp: 20 tablet, Rfl: 0 .  pantoprazole (PROTONIX) 40 MG tablet, Take 1 tablet (40 mg total) by mouth 2 (two) times daily., Disp: 60 tablet, Rfl: 5 .  tiZANidine (ZANAFLEX) 4 MG tablet, Take 4 mg by mouth daily as needed for pain., Disp: , Rfl:  .  rosuvastatin (CRESTOR) 40 MG tablet, Take 1 tablet (40 mg total) by mouth daily., Disp: 90 tablet, Rfl:  3  Review of Systems  Constitutional: Negative for appetite change, chills, fatigue and fever.  Respiratory: Negative for chest tightness and shortness of breath.   Cardiovascular: Negative for chest pain and palpitations.  Gastrointestinal: Negative for abdominal pain, nausea and vomiting.  Skin: Positive for color change (blister on toe of right foot).  Neurological: Negative for dizziness and weakness.  Psychiatric/Behavioral: Positive for agitation and dysphoric mood. Negative for decreased concentration. The patient is not nervous/anxious.        Irritable and excessive crying    Social History   Tobacco Use  . Smoking status: Former Smoker    Types:  Cigarettes    Quit date: 08/31/1994    Years since quitting: 23.9  . Smokeless tobacco: Never Used  . Tobacco comment: quit 28 years ago  Substance Use Topics  . Alcohol use: No    Alcohol/week: 0.0 standard drinks      Objective:   BP 118/72 (BP Location: Left Arm, Patient Position: Sitting, Cuff Size: Normal)   Pulse 65   Temp 98.6 F (37 C) (Oral)   Resp 16   Wt 164 lb (74.4 kg)   SpO2 99% Comment: room air  BMI 29.05 kg/m  Vitals:   08/08/18 1512  BP: 118/72  Pulse: 65  Resp: 16  Temp: 98.6 F (37 C)  TempSrc: Oral  SpO2: 99%  Weight: 164 lb (74.4 kg)     Physical Exam   General appearance: alert, well developed, well nourished, cooperative and in no distress Head: Normocephalic, without obvious abnormality, atraumatic Respiratory: Respirations even and unlabored, normal respiratory rate Extremities: No gross deformities Skin: Skin color, texture, turgor normal. No rashes seen  Psych: Appropriate mood and affect. Neurologic: Mental status: Alert, oriented to person, place, and time, thought content appropriate.    Office Visit from 10/20/2017 in [place]  PHQ-2 Total Score  3          Assessment & Plan    1. Anxiety Refill.  - ALPRAZolam (XANAX) 1 MG tablet; Take 1 mg by mouth 2 (two) times daily as needed for anxiety.  2. Major depressive disorder, recurrent, severe with psychotic features ([place]) Change citalopram to venlafaxine. Given weaning and titration schedule - venlafaxine XR (EFFEXOR XR) 37.5 MG 24 hr capsule; Take 1 capsule (37.5 mg total) by mouth daily with breakfast.  Dispense: 30 capsule; Refill: 0  3. Mood swings See if this improves with better control of anxiety and depression. Consider HRT.   Future Appointments  Date Time Provider [place]  08/31/2018  8:40 AM [name], MD [facility] None  10/07/2018  8:00 AM Caryn Section, Kirstie Peri, MD [facility] None       [name], MD  [facility]

## 2018-08-09 DIAGNOSIS — I129 Hypertensive chronic kidney disease with stage 1 through stage 4 chronic kidney disease, or unspecified chronic kidney disease: Secondary | ICD-10-CM | POA: Diagnosis not present

## 2018-08-09 DIAGNOSIS — N183 Chronic kidney disease, stage 3 (moderate): Secondary | ICD-10-CM | POA: Diagnosis not present

## 2018-08-09 DIAGNOSIS — E782 Mixed hyperlipidemia: Secondary | ICD-10-CM | POA: Diagnosis not present

## 2018-08-09 DIAGNOSIS — E669 Obesity, unspecified: Secondary | ICD-10-CM | POA: Diagnosis not present

## 2018-08-09 DIAGNOSIS — E1122 Type 2 diabetes mellitus with diabetic chronic kidney disease: Secondary | ICD-10-CM | POA: Diagnosis not present

## 2018-08-09 DIAGNOSIS — E1142 Type 2 diabetes mellitus with diabetic polyneuropathy: Secondary | ICD-10-CM | POA: Diagnosis not present

## 2018-08-09 DIAGNOSIS — Z9884 Bariatric surgery status: Secondary | ICD-10-CM | POA: Diagnosis not present

## 2018-08-09 DIAGNOSIS — E039 Hypothyroidism, unspecified: Secondary | ICD-10-CM | POA: Diagnosis not present

## 2018-08-13 DIAGNOSIS — I69354 Hemiplegia and hemiparesis following cerebral infarction affecting left non-dominant side: Secondary | ICD-10-CM | POA: Diagnosis not present

## 2018-08-13 DIAGNOSIS — R296 Repeated falls: Secondary | ICD-10-CM | POA: Diagnosis not present

## 2018-08-13 DIAGNOSIS — I959 Hypotension, unspecified: Secondary | ICD-10-CM | POA: Diagnosis not present

## 2018-08-13 DIAGNOSIS — I69328 Other speech and language deficits following cerebral infarction: Secondary | ICD-10-CM | POA: Diagnosis not present

## 2018-08-13 DIAGNOSIS — R4781 Slurred speech: Secondary | ICD-10-CM | POA: Diagnosis not present

## 2018-08-13 DIAGNOSIS — E1140 Type 2 diabetes mellitus with diabetic neuropathy, unspecified: Secondary | ICD-10-CM | POA: Diagnosis not present

## 2018-08-16 ENCOUNTER — Encounter: Payer: Self-pay | Admitting: Emergency Medicine

## 2018-08-16 ENCOUNTER — Other Ambulatory Visit: Payer: Self-pay

## 2018-08-16 ENCOUNTER — Ambulatory Visit
Admission: EM | Admit: 2018-08-16 | Discharge: 2018-08-16 | Disposition: A | Discharge status: Home / Self Care | Payer: Medicare Other | Attending: Urgent Care | Admitting: Urgent Care

## 2018-08-16 ENCOUNTER — Ambulatory Visit: Payer: Medicare Other

## 2018-08-16 DIAGNOSIS — W01198A Fall on same level from slipping, tripping and stumbling with subsequent striking against other object, initial encounter: Secondary | ICD-10-CM | POA: Diagnosis not present

## 2018-08-16 DIAGNOSIS — M25562 Pain in left knee: Secondary | ICD-10-CM | POA: Diagnosis not present

## 2018-08-16 DIAGNOSIS — S8992XA Unspecified injury of left lower leg, initial encounter: Secondary | ICD-10-CM | POA: Diagnosis not present

## 2018-08-16 MED ORDER — DICLOFENAC SODIUM 1 % TD GEL: 2.0000 g | Freq: Three times a day (TID) | TRANSDERMAL | 0 refills | Status: DC | PRN

## 2018-08-16 NOTE — Discharge Instructions (Addendum)
It was very nice seeing you today in clinic. Thank you for entrusting me with your care.   As discussed, your pain seems to be musculoskeletal in nature. Plans for treating you are as follows:  Please utilize the medications that we discussed. Your prescriptions have been called in to your pharmacy.  May use Tylenol as needed for pain. REST your knee. Wear compression bandage for stability and comfort.  Avoid overdoing it, but you need to make efforts to remain active as tolerated.  Avoiding activity all together can make your pain worse. You may find that alternating between ice and moist heat application will help with your pain.  Heat/ice should be applied for 10-15 minutes at a time at least 3-4 times a day.  Make arrangements to follow up with your regular doctor in 1 week for re-evaluation. If your symptoms/condition worsens, please seek follow up care either here or in the ER. Please remember, our [place] providers are "right here with you" when you need [place].   Again, it was my pleasure to take care of you today. Thank you for choosing our clinic. I hope that you start to feel better quickly.   Honor Loh, MSN, APRN, FNP-C, CEN Advanced Practice Provider [facility]

## 2018-08-16 NOTE — ED Triage Notes (Signed)
Patient states she fell this morning injuring her left knee.  Patient states she broke the same knee several years back.

## 2018-08-16 NOTE — ED Provider Notes (Signed)
[place], [place]   Name: [name] DOB: 26-May-1970 MRN: [id number] CSN: [id number] PCP: [name], MD  Arrival date and time:  08/16/18 1546  Chief Complaint:  Knee Pain   NOTE: Prior to seeing the patient today, I have reviewed the triage nursing documentation and vital signs. Clinical staff has updated patient's PMH/PSHx, current medication list, and drug allergies/intolerances to ensure comprehensive history available to assist in medical decision making.   History:   HPI: [name] is a 48 y.o. female who presents today with complaints of pain in her LEFT knee. Patient describes a mechanical fall whereby she tripped and fell this morning at around 11:00am. Upon falling, patient notes that she fell directly onto her knee causing her to experience immediate pain. Patient able to bear weight, but notes increased pain with ambulation.She has areas of minor abrasions noted to her RIGHT posterior forearm.  PMH (+) for previous "cracked knee" several years ago. She has never required surgical intervention for her knee. Despite her symptoms, patient has not taken any over the counter interventions to help improve/relieve her reported symptoms at home.  Past Medical History:  Diagnosis Date   Acute pyelonephritis    Anemia    iron deficiency anemia   Aortic arch aneurysm ([place])    Bipolar disorder ([place])    BRCA negative 2014   CAD (coronary artery disease)    a. 08/2003 Cath: LAD 30-40-med Rx; b. 11/2014 PCI: LAD 80m(3.25x23 Xience Alpine DES); c. 06/2015 PCI: D1 (2.25x12 Resolute Integrity DES); d. 06/2017 PCI: Patent mLAD stent, D2 95 (PTCA); e. 09/2017 PCI: D2 99ost (CBA); d. 12/2017 Cath: LM nl, LAD 333m80d (small), D1 40ost, D2 95ost, LCX 40p, RCA 40ost/p->Med rx for D2 given restenosis.   CKD (chronic kidney disease), stage III (HCC)    Colon polyp    CVA (cerebral vascular accident) ([facility]   Left side weakness.    Diabetes ([facility]   Family history  of breast cancer    BRCA neg 2014   Gastric ulcer 04/27/2011   History of echocardiogram    a. 03/2017 Echo: EF 60-65%, no rwma; b. 02/2018 Echo: EF 60-65%, no rwma. Nl RV fxn. No cardiac source of emboli (admitted w/ stroke).   HTN (hypertension)    Hyperlipemia    Hypothyroid    Malignant melanoma of skin of scalp (HCC)    MI, acute, non ST segment elevation (HCC)    Neuromuscular disorder (HCC)    Orthostatic hypotension    S/P drug eluting coronary stent placement 06/04/2015   Sepsis ([facility]/14/2019   Stroke ([facility]   a. 02/2018 MRI: 59m72mate acute/early subacute L medial frontal lobe inarct; b. 02/2018 MRA No large vessel occlusion or aneurysm. Mod to sev L P2 stenosis. thready L vertebral artery, diffusely dzs'd; c. 02/2018 Carotid U/S: <50% bilat ICA dzs.    Past Surgical History:  Procedure Laterality Date   APPENDECTOMY     CARDIAC CATHETERIZATION N/A 11/09/2014   Procedure: Coronary Angiography;  Surgeon: TimMinna MerrittsD;  [facility];  Service: Cardiovascular;  Laterality: N/A;   CARDIAC CATHETERIZATION N/A 11/12/2014   Procedure: Coronary Stent Intervention;  Surgeon: AleIsaias CowmanD;  [facility];  Service: Cardiovascular;  Laterality: N/A;   CARDIAC CATHETERIZATION N/A 04/18/2015   Procedure: Left Heart Cath and Coronary Angiography;  Surgeon: TimMinna MerrittsD;  [facility];  Service: Cardiovascular;  Laterality: N/A;   CARDIAC CATHETERIZATION Left 06/04/2015   Procedure: Left Heart Cath and Coronary Angiography;  Surgeon: Wellington Hampshire, MD;  Location: [facility] [facility];  Service: Cardiovascular;  Laterality: Left;   CARDIAC CATHETERIZATION N/A 06/04/2015   Procedure: Coronary Stent Intervention;  Surgeon: Wellington Hampshire, MD;  Location: [place] [facility];  Service: Cardiovascular;  Laterality: N/A;   CESAREAN SECTION  2001   CHOLECYSTECTOMY N/A 11/18/2016   Procedure:  LAPAROSCOPIC CHOLECYSTECTOMY WITH INTRAOPERATIVE CHOLANGIOGRAM;  Surgeon: Christene Lye, MD;  Location: [facility];  Service: General;  Laterality: N/A;   COLONOSCOPY WITH PROPOFOL N/A 04/27/2016   Procedure: COLONOSCOPY WITH PROPOFOL;  Surgeon: [name], MD;  Location: [facility];  Service: Endoscopy;  Laterality: N/A;   COLONOSCOPY WITH PROPOFOL N/A 01/12/2018   Procedure: COLONOSCOPY WITH PROPOFOL;  Surgeon: Toledo, Benay Pike, MD;  Location: [facility];  Service: Endoscopy;  Laterality: N/A;   CORONARY ANGIOPLASTY     CORONARY BALLOON ANGIOPLASTY N/A 06/29/2017   Procedure: CORONARY BALLOON ANGIOPLASTY;  Surgeon: Wellington Hampshire, MD;  Location: [place] [facility];  Service: Cardiovascular;  Laterality: N/A;   CORONARY BALLOON ANGIOPLASTY N/A 09/20/2017   Procedure: CORONARY BALLOON ANGIOPLASTY;  Surgeon: Wellington Hampshire, MD;  Location: [place] [facility];  Service: Cardiovascular;  Laterality: N/A;   DILATION AND CURETTAGE OF UTERUS     ESOPHAGOGASTRODUODENOSCOPY (EGD) WITH PROPOFOL N/A 09/14/2014   Procedure: ESOPHAGOGASTRODUODENOSCOPY (EGD) WITH PROPOFOL;  Surgeon: Josefine Class, MD;  Location[facility];  Service: Endoscopy;  Laterality: N/A;   ESOPHAGOGASTRODUODENOSCOPY (EGD) WITH PROPOFOL N/A 04/27/2016   Procedure: ESOPHAGOGASTRODUODENOSCOPY (EGD) WITH PROPOFOL;  Surgeon: [name], MD;  Location: [place];  Service: Endoscopy;  Laterality: N/A;  Diabetic - oral meds   ESOPHAGOGASTRODUODENOSCOPY (EGD) WITH PROPOFOL N/A 01/12/2018   Procedure: ESOPHAGOGASTRODUODENOSCOPY (EGD) WITH PROPOFOL;  Surgeon: Toledo, Benay Pike, MD;  Location: [facility];  Service: Endoscopy;  Laterality: N/A;   GASTRIC BYPASS  09/2009   [facility]    Left Carotid to sublcavian artery bypass w/ subclavian artery ligation     a. Performed @ [facility].   LEFT HEART CATH AND CORONARY ANGIOGRAPHY Left 06/29/2017   Procedure: LEFT HEART  CATH AND CORONARY ANGIOGRAPHY;  Surgeon: Wellington Hampshire, MD;  Location: [place] [facility];  Service: Cardiovascular;  Laterality: Left;   LEFT HEART CATH AND CORONARY ANGIOGRAPHY N/A 09/20/2017   Procedure: LEFT HEART CATH AND CORONARY ANGIOGRAPHY;  Surgeon: Wellington Hampshire, MD;  Location: [place] [facility];  Service: Cardiovascular;  Laterality: N/A;   LEFT HEART CATH AND CORONARY ANGIOGRAPHY N/A 12/20/2017   Procedure: LEFT HEART CATH AND CORONARY ANGIOGRAPHY;  Surgeon: Wellington Hampshire, MD;  Location: [place] [facility];  Service: Cardiovascular;  Laterality: N/A;   MELANOMA EXCISION  2016   Dr. Henreitta Cea ABLATION  2002   RIGHT OOPHORECTOMY     SHOULDER ARTHROSCOPY WITH OPEN ROTATOR CUFF REPAIR Right 01/07/2016   Procedure: SHOULDER ARTHROSCOPY WITH DEBRIDMENT, SUBACHROMIAL DECOMPRESSION;  Surgeon: Corky Mull, MD;  Location: [facility];  Service: Orthopedics;  Laterality: Right;   SHOULDER ARTHROSCOPY WITH OPEN ROTATOR CUFF REPAIR Right 03/16/2017   Procedure: SHOULDER ARTHROSCOPY WITH OPEN ROTATOR CUFF REPAIR POSSIBLE BICEPS TENODESIS;  Surgeon: Corky Mull, MD;  Location: [facility];  Service: Orthopedics;  Laterality: Right;   TRIGGER FINGER RELEASE Right     Middle Finger    Family History  Problem Relation Age of Onset  Hypertension Mother    Anxiety disorder Mother    Depression Mother    Bipolar disorder Mother    Heart disease Mother        No details   Hyperlipidemia Mother    Kidney disease Father    Heart disease Father 3   Hypertension Father    Diabetes Father    Stroke Father    Colon cancer Father        dx in his 46's   Anxiety disorder Father    Depression Father    Skin cancer Father    Kidney disease Sister    Thyroid nodules Sister    Hypertension Sister    Hypertension Sister    Diabetes Sister    Hyperlipidemia Sister    Depression Sister    Breast cancer Maternal Aunt 54   Breast cancer Maternal  Aunt 88   Ovarian cancer Cousin    Colon cancer Cousin    Breast cancer Other    Kidney cancer Neg Hx    Bladder Cancer Neg Hx     Social History   Tobacco Use   Smoking status: Former Smoker    Types: Cigarettes    Quit date: 08/31/1994    Years since quitting: 23.9   Smokeless tobacco: Never Used   Tobacco comment: quit 28 years ago  Substance Use Topics   Alcohol use: No    Alcohol/week: 0.0 standard drinks   Drug use: No    Patient Active Problem List   Diagnosis Date Noted   Hypotension 06/26/2018   Autonomic neuropathy 03/24/2018   Acute delirium 03/03/2018   H/O gastric bypass 03/02/2018   MI, acute, non ST segment elevation ([place])    Effort angina ([place]) 06/29/2017   Unstable angina ([place]) 06/24/2017   Syncope 04/09/2017   Insomnia 03/18/2017   Ischemic cardiomyopathy    Arthritis    Anxiety    Acute colitis 01/27/2017   Hx of colonic polyps    H/O medication noncompliance 12/14/2015   Emesis    Atherosclerosis of native coronary artery of native heart with stable angina pectoris ([place])    Hypertensive heart disease    CKD (chronic kidney disease), stage III ([place]) 06/05/2015   Presence of drug coated stent in LAD coronary artery & now in D1 06/04/2015   Colitis 06/03/2015   Carotid stenosis 04/30/2015   Type 2 diabetes mellitus with stage 3 chronic kidney disease, without long-term current use of insulin ([place])    Stable angina pectoris ([place]) 04/17/2015   Iron deficiency anemia 03/22/2015   Vitamin B12 deficiency 02/18/2015   Misuse of medications for pain 02/18/2015   Major depressive disorder, recurrent, severe with psychotic features ([place]) 38/46/6599   Helicobacter pylori infection 11/23/2014   Hemiparesis, left ([place]) 11/23/2014   Benign neoplasm of colon 11/20/2014   Malignant melanoma ([place]) 35/70/1779   Chronic systolic CHF (congestive heart failure) ([place])    Incomplete bladder emptying 07/12/2014   Adult  hypothyroidism 12/30/2013   Aberrant subclavian artery 11/17/2013   Multiple sclerosis ([name]) 11/02/2013   History of CVA (cerebrovascular accident) 06/20/2013   Headache, migraine 05/29/2013   Hyperlipidemia    GERD (gastroesophageal reflux disease)    Neuropathy ([place]) 01/02/2011   Stroke ([place]) 06/21/2008   Essential hypertension 05/01/2008    Home Medications:    Current Meds  Medication Sig   ALPRAZolam (XANAX) 1 MG tablet Take 1 mg by mouth 2 (two) times daily as needed for anxiety.   buPROPion (WELLBUTRIN XL)  150 MG 24 hr tablet TAKE 1 TABLET BY MOUTH DAILY (Patient taking differently: Take 150 mg by mouth daily. )   clopidogrel (PLAVIX) 75 MG tablet Take 1 tablet (75 mg total) by mouth daily.   diphenoxylate-atropine (LOMOTIL) 2.5-0.025 MG tablet Take 1 tablet by mouth 4 (four) times daily as needed for diarrhea or loose stools.   gabapentin (NEURONTIN) 300 MG capsule TAKE 1 CAPSULE BY MOUTH TWICE A DAY   isosorbide mononitrate (IMDUR) 30 MG 24 hr tablet Take 1 tablet (30 mg total) by mouth daily.   levothyroxine (SYNTHROID, LEVOTHROID) 25 MCG tablet Take 1 tablet (25 mcg total) by mouth daily before breakfast.   losartan (COZAAR) 25 MG tablet Take 25 mg by mouth daily.   metoprolol succinate (TOPROL XL) 25 MG 24 hr tablet Take 0.5 tablets (12.5 mg total) by mouth daily.   nitroGLYCERIN (NITROSTAT) 0.4 MG SL tablet Place 1 tablet (0.4 mg total) under the tongue every 5 (five) minutes as needed for chest pain.   ondansetron (ZOFRAN) 4 MG tablet Take 1 tablet (4 mg total) by mouth every 8 (eight) hours as needed.   pantoprazole (PROTONIX) 40 MG tablet Take 1 tablet (40 mg total) by mouth 2 (two) times daily.   rosuvastatin (CRESTOR) 40 MG tablet Take 1 tablet (40 mg total) by mouth daily.   venlafaxine XR (EFFEXOR XR) 37.5 MG 24 hr capsule Take 1 capsule (37.5 mg total) by mouth daily with breakfast.   [DISCONTINUED] metFORMIN (GLUCOPHAGE-XR) 500 MG 24 hr  tablet Take 500 mg by mouth 2 (two) times daily.     Allergies:   Lipitor [atorvastatin], Tramadol, and Lactose intolerance (gi)  Review of Systems (ROS): Review of Systems  Constitutional: Negative for chills and fever.  Respiratory: Negative for cough and shortness of breath.   Cardiovascular: Negative for chest pain and palpitations.  Musculoskeletal: Positive for gait problem (2/2 pain).       Acute LEFT knee pain  Skin: Positive for wound (RUE).  Neurological: Negative for dizziness, syncope, weakness and headaches.     Vital Signs: Today's Vitals   08/16/18 1600 08/16/18 1602 08/16/18 1603 08/16/18 1706  BP: 135/88     Pulse: 85     Resp: 16     Temp: 98.2 F (36.8 C)     SpO2: 100%  100%   Weight:   160 lb (72.6 kg)   Height:   '5\' 3"'  (1.6 m)   PainSc:  10-Worst pain ever  9     Physical Exam: Physical Exam  Constitutional: She is oriented to person, place, and time and well-developed, well-nourished, and in no distress.  HENT:  Head: Normocephalic and atraumatic.  Mouth/Throat: Mucous membranes are normal.  Neck: Normal range of motion. Neck supple.  Cardiovascular: Normal rate, regular rhythm, normal heart sounds and intact distal pulses. Exam reveals no gallop and no friction rub.  No murmur heard. Pulmonary/Chest: Effort normal and breath sounds normal. No respiratory distress. She has no wheezes. She has no rales.  Musculoskeletal:     Left knee: She exhibits decreased range of motion, swelling (slight) and erythema. She exhibits no effusion, no ecchymosis, no deformity, no laceration, normal alignment, no LCL laxity, normal patellar mobility, normal meniscus and no MCL laxity. Tenderness (generalized) found.  Neurological: She is alert and oriented to person, place, and time. Gait normal. GCS score is 15.  Skin: Skin is warm and dry. Abrasion (posterior RUE; no bleeding) noted. No rash noted.  Psychiatric: Mood, memory, affect  and judgment normal.  Nursing  note and vitals reviewed.   Urgent Care Treatments / Results:   LABS: PLEASE NOTE: all labs that were ordered this encounter are listed, however only abnormal results are displayed. Labs Reviewed - No data to display  EKG: -None  RADIOLOGY: Dg Knee Complete 4 Views Left  Result Date: 08/16/2018 CLINICAL DATA:  Fall this morning.  Pain. EXAM: LEFT KNEE - COMPLETE 4+ VIEW COMPARISON:  None. FINDINGS: Vascular calcifications are identified no acute fracture or effusion. No other acute abnormalities. IMPRESSION: Negative. Electronically Signed   By: Dorise Bullion III M.D   On: 08/16/2018 16:46   PROCEDURES: Procedures  MEDICATIONS RECEIVED THIS VISIT: Medications - No data to display  PERTINENT CLINICAL COURSE NOTES/UPDATES:   Initial Impression / Assessment and Plan / Urgent Care Course:  Pertinent labs & imaging results that were available during my care of the patient were personally reviewed by me and considered in my medical decision making (see lab/imaging section of note for values and interpretations).  [name] is a 48 y.o. female who presents to [facility] today with complaints of Knee Pain  Patient overall well appearing and in no acute distress today in clinic. Exam as per above. Diagnostic radiographs of the LEFT knee revealed no acute fracture, dislocation, or effusions. Patient placed in a compression wrap. Patient unable to take oral NSAIDs due to daily oral anticoagulation therapy (clopedigrel). Will send in prescription for topical Voltaren to help with her pain. Discussed conservative treatment using rest, ice, compression, and elevation. She is cleared for WBAT at this point.  If not improving, she will need to see orthopedics to discuss further.   In the event that patient needs to be seen for further evaluation by orthopedics, I have provider here with then ame and office contact information provided on today's AVS for Dr. Hessie Knows. Patient  advised the she will need to contact the office to schedule an appointment to be seen.   I have reviewed the follow up and strict return precautions for any new or worsening symptoms. Patient is aware of symptoms that would be deemed urgent/emergent, and would thus require further evaluation either here or in the emergency department. At the time of discharge, she verbalized understanding and consent with the discharge plan as it was reviewed with her. All questions were fielded by provider and/or clinic staff prior to patient discharge.    Final Clinical Impressions / Urgent Care Diagnoses:   Final diagnoses:  Acute pain of left knee    New Prescriptions:  [place] Controlled Substance Registry consulted? Not Applicable  Meds ordered this encounter  Medications   diclofenac sodium (VOLTAREN) 1 % GEL    Sig: Apply 2 g topically 3 (three) times daily as needed.    Dispense:  100 g    Refill:  0    Recommended Follow up Care:  Patient encouraged to follow up with the following provider within the specified time frame, or sooner as dictated by the severity of her symptoms. As always, she was instructed that for any urgent/emergent care needs, she should seek care either here or in the emergency department for more immediate evaluation.  Follow-up Information    Hessie Knows, MD In 1 week.   Specialty: Orthopedic Surgery Why: General reassessment of symptoms if not improving Contact information: [place] [phone]         NOTE: This note was  prepared using Lobbyist along with smaller Company secretary. Despite my best ability to proofread, there is the potential that transcriptional errors may still occur from this process, and are completely unintentional.     Karen Kitchens, NP 08/17/18 1735

## 2018-08-31 ENCOUNTER — Ambulatory Visit (INDEPENDENT_AMBULATORY_CARE_PROVIDER_SITE_OTHER): Payer: Medicare Other | Admitting: Family Medicine

## 2018-08-31 ENCOUNTER — Other Ambulatory Visit: Payer: Self-pay

## 2018-08-31 ENCOUNTER — Encounter: Payer: Self-pay | Admitting: Family Medicine

## 2018-08-31 VITALS — BP 122/80 | HR 75 | Temp 97.8°F | Resp 16 | Wt 159.0 lb

## 2018-08-31 DIAGNOSIS — F439 Reaction to severe stress, unspecified: Secondary | ICD-10-CM | POA: Diagnosis not present

## 2018-08-31 DIAGNOSIS — F333 Major depressive disorder, recurrent, severe with psychotic symptoms: Secondary | ICD-10-CM

## 2018-08-31 DIAGNOSIS — I255 Ischemic cardiomyopathy: Secondary | ICD-10-CM

## 2018-08-31 MED ORDER — VENLAFAXINE HCL ER 75 MG PO CP24: 75.0000 mg | ORAL_CAPSULE | Freq: Every day | ORAL | 2 refills | Status: DC

## 2018-08-31 NOTE — Patient Instructions (Signed)
.   Please review the attached list of medications and notify my office if there are any errors.   . Please bring all of your medications to every appointment so we can make sure that our medication list is the same as yours.   . We will have flu vaccines available after Labor Day. Please go to your pharmacy or call the office in early September to schedule you flu shot.   

## 2018-08-31 NOTE — Progress Notes (Signed)
Patient: [name] Female    DOB: 1971-01-06   48 y.o.   MRN: [id number] Visit Date: 08/31/2018  Today's Provider: [name], MD   Chief Complaint  Patient presents with  . Follow-up   Subjective:     HPI  Follow up for Anxiety/ Depression/ Mood Swings:  The patient was last seen for this 3 weeks ago. Changes made at last visit include changing Citalopram to Venlafaxine. She has titrated up to 2  X 37.5 mg tabs a day and still take bupropion  She reports good compliance with treatment. She feels that condition is Improved. Is not feeling as depressed. Is still having some anxiety which is aggravated by one of her son's friends having to move in with them.   She is not having side effects.   ------------------------------------------------------------------------------------  Allergies  Allergen Reactions  . Lipitor [Atorvastatin] Other (See Comments)    Leg pains  . Tramadol Other (See Comments)    Mouth feels like it's on fire  . Lactose Intolerance (Gi) Nausea Only     Current Outpatient Medications:  .  ALPRAZolam (XANAX) 1 MG tablet, Take 1 mg by mouth 2 (two) times daily as needed for anxiety., Disp: , Rfl:  .  Blood Glucose Monitoring Suppl (ONE TOUCH ULTRA 2) w/Device KIT, Use to check blood sugar once a day. Dx. E11.9, Disp: 1 each, Rfl: 0 .  buPROPion (WELLBUTRIN XL) 150 MG 24 hr tablet, TAKE 1 TABLET BY MOUTH DAILY (Patient taking differently: Take 150 mg by mouth daily. ), Disp: 30 tablet, Rfl: 11 .  clopidogrel (PLAVIX) 75 MG tablet, Take 1 tablet (75 mg total) by mouth daily., Disp: 90 tablet, Rfl: 3 .  diclofenac sodium (VOLTAREN) 1 % GEL, Apply 2 g topically 3 (three) times daily as needed., Disp: 100 g, Rfl: 0 .  diphenoxylate-atropine (LOMOTIL) 2.5-0.025 MG tablet, Take 1 tablet by mouth 4 (four) times daily as needed for diarrhea or loose stools., Disp: , Rfl:  .  gabapentin (NEURONTIN) 300 MG capsule, TAKE 1 CAPSULE BY MOUTH TWICE  A DAY, Disp: 180 capsule, Rfl: 4 .  isosorbide mononitrate (IMDUR) 30 MG 24 hr tablet, Take 1 tablet (30 mg total) by mouth daily., Disp: 30 tablet, Rfl: 0 .  levothyroxine (SYNTHROID, LEVOTHROID) 25 MCG tablet, Take 1 tablet (25 mcg total) by mouth daily before breakfast., Disp: 30 tablet, Rfl: 12 .  losartan (COZAAR) 25 MG tablet, Take 25 mg by mouth daily., Disp: , Rfl:  .  metoprolol succinate (TOPROL XL) 25 MG 24 hr tablet, Take 0.5 tablets (12.5 mg total) by mouth daily., Disp: , Rfl:  .  nitroGLYCERIN (NITROSTAT) 0.4 MG SL tablet, Place 1 tablet (0.4 mg total) under the tongue every 5 (five) minutes as needed for chest pain., Disp: 25 tablet, Rfl: 2 .  ondansetron (ZOFRAN) 4 MG tablet, Take 1 tablet (4 mg total) by mouth every 8 (eight) hours as needed., Disp: 20 tablet, Rfl: 0 .  pantoprazole (PROTONIX) 40 MG tablet, Take 1 tablet (40 mg total) by mouth 2 (two) times daily., Disp: 60 tablet, Rfl: 5 .  tiZANidine (ZANAFLEX) 4 MG tablet, Take 4 mg by mouth daily as needed for pain., Disp: , Rfl:  .  venlafaxine XR (EFFEXOR XR) 37.5 MG 24 hr capsule, Take 1 capsule (37.5 mg total) by mouth daily with breakfast., Disp: 30 capsule, Rfl: 0 .  rosuvastatin (CRESTOR) 40 MG tablet, Take 1 tablet (40 mg total) by  mouth daily., Disp: 90 tablet, Rfl: 3  Review of Systems  Constitutional: Negative for appetite change, chills, fatigue and fever.  Respiratory: Negative for chest tightness and shortness of breath.   Cardiovascular: Negative for chest pain and palpitations.  Gastrointestinal: Negative for abdominal pain, nausea and vomiting.  Neurological: Negative for dizziness and weakness.  Psychiatric/Behavioral: Positive for agitation and dysphoric mood.    Social History   Tobacco Use  . Smoking status: Former Smoker    Types: Cigarettes    Quit date: 08/31/1994    Years since quitting: 24.0  . Smokeless tobacco: Never Used  . Tobacco comment: quit 28 years ago  Substance Use Topics  .  Alcohol use: No    Alcohol/week: 0.0 standard drinks      Objective:   BP 122/80 (BP Location: Left Arm, Patient Position: Sitting, Cuff Size: Normal)   Pulse 75   Temp 97.8 F (36.6 C) (Oral)   Resp 16   Wt 159 lb (72.1 kg)   SpO2 95% Comment: room air  BMI 28.17 kg/m  Vitals:   08/31/18 0842  BP: 122/80  Pulse: 75  Resp: 16  Temp: 97.8 F (36.6 C)  TempSrc: Oral  SpO2: 95%  Weight: 159 lb (72.1 kg)     Physical Exam    General Appearance:    Alert, cooperative, no distress  Eyes:    PERRL, conjunctiva/corneas clear, EOM's intact       Lungs:     Clear to auscultation bilaterally, respirations unlabored  Heart:    Normal heart rate. Normal rhythm. No murmurs, rubs, or gallops.   Neurologic:   Awake, alert, oriented x 3. No apparent focal neurological           defect.           Assessment & Plan    1. Major depressive disorder, recurrent, severe with psychotic features ([place]) Improved with change from citalopram to venlafazsine, in addition to to bupropion. Continue current medications.   - venlafaxine XR (EFFEXOR-XR) 75 MG 24 hr capsule; Take 1 capsule (75 mg total) by mouth daily with breakfast.  Dispense: 30 capsule; Refill: 2  2. Situational stress Exacerbated by her son's friend having to move in with them. Advised we could consider increase venlafaxine, but would probably need to reduce bupropion if we did so.  Continue current medications for now. Reassess in 6 weeks.      [name], MD  [facility]

## 2018-09-04 ENCOUNTER — Other Ambulatory Visit: Payer: Self-pay | Admitting: Family Medicine

## 2018-09-06 IMAGING — MG MM DIGITAL DIAGNOSTIC BILAT W/ TOMO W/ CAD
8 of 17 series · 8 of 40 positions shown · non-contrast
Comparison: 12/13/2015

CLINICAL DATA: Patient has a palpable abnormality in the
retroareolar region of the right breast. Focal tenderness in the
retroareolar region of the left breast.Recent weight loss of greater
than 20 pounds.

EXAM:
2D DIGITAL DIAGNOSTIC BILATERAL MAMMOGRAM WITH CAD AND ADJUNCT TOMO
ULTRASOUND BILATERAL BREAST

[R CC]
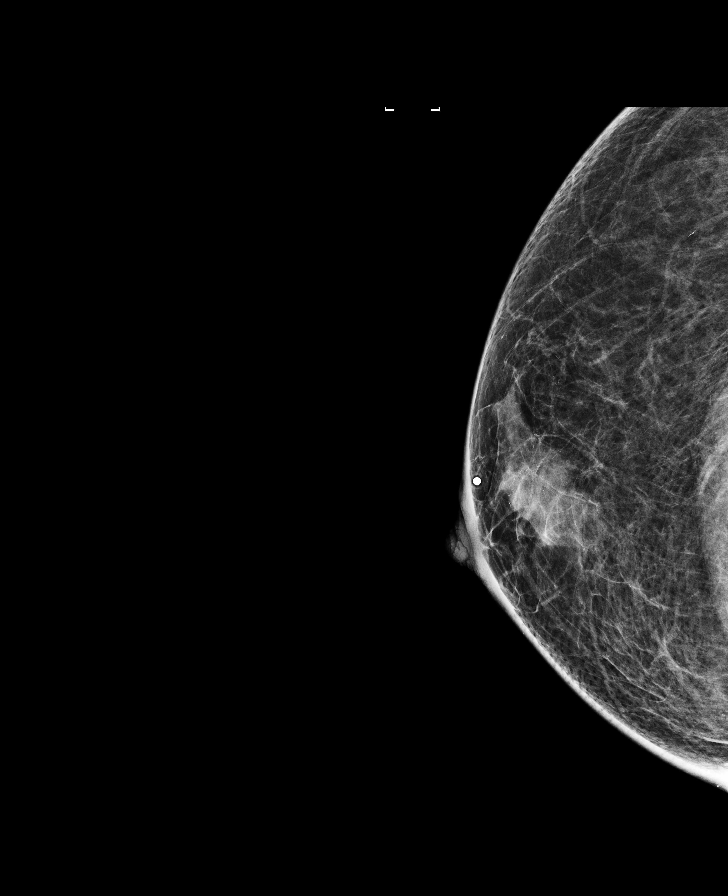

[R MLO]
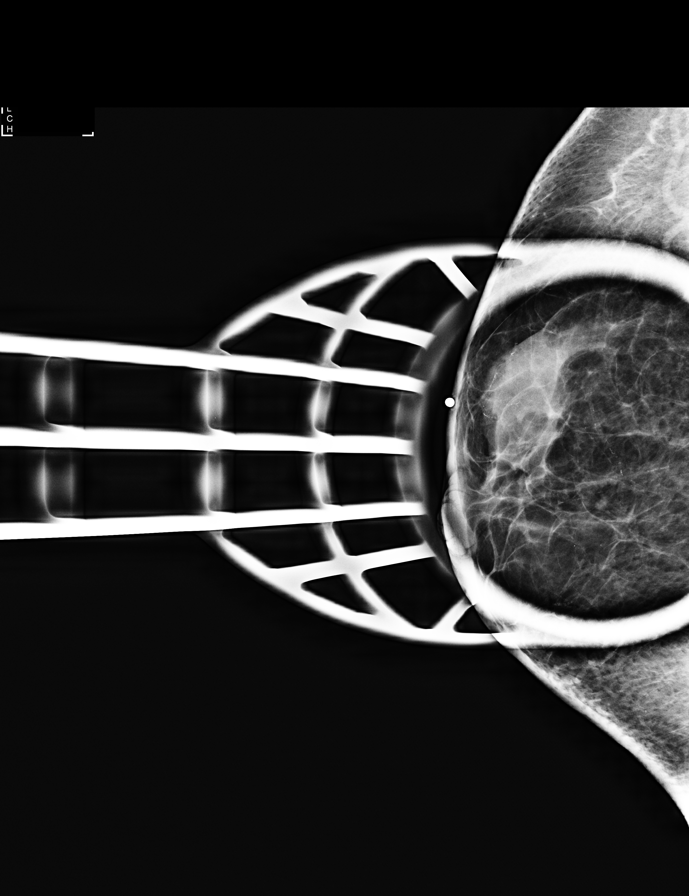

[L MLO synth-2D (1 of 2)]
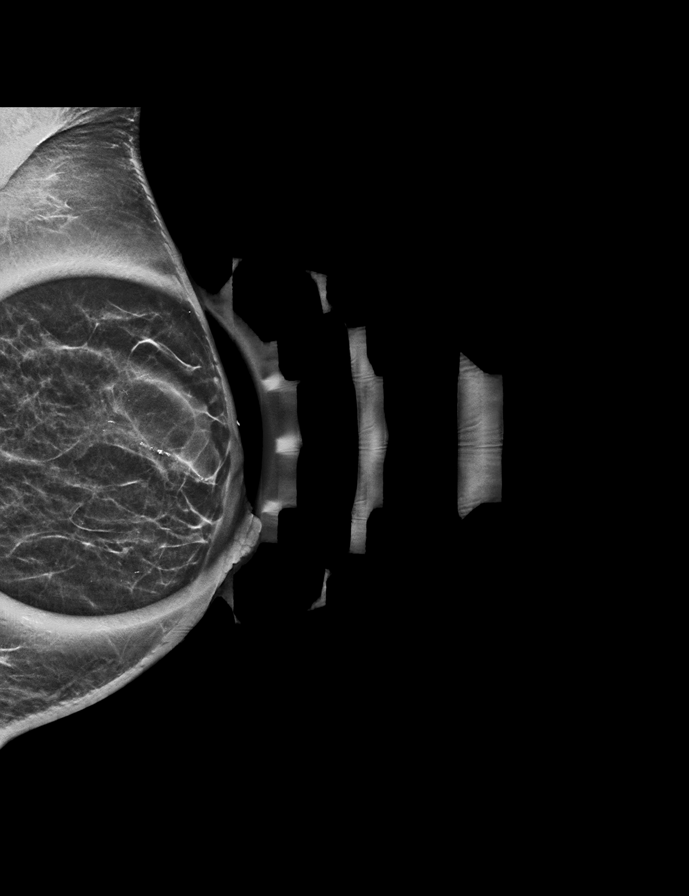

[L CC]
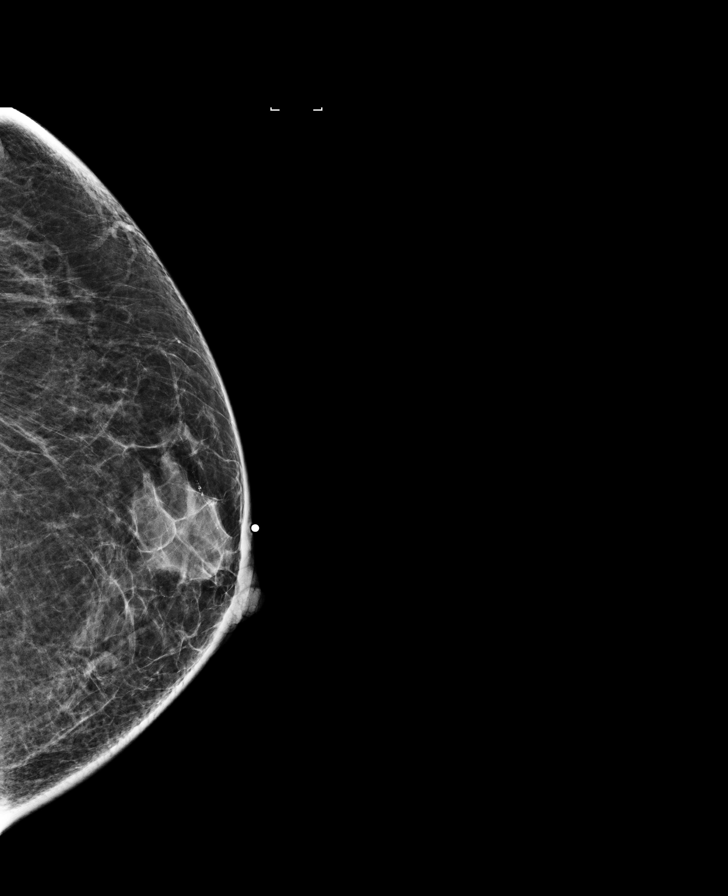

[R CC synth-2D]
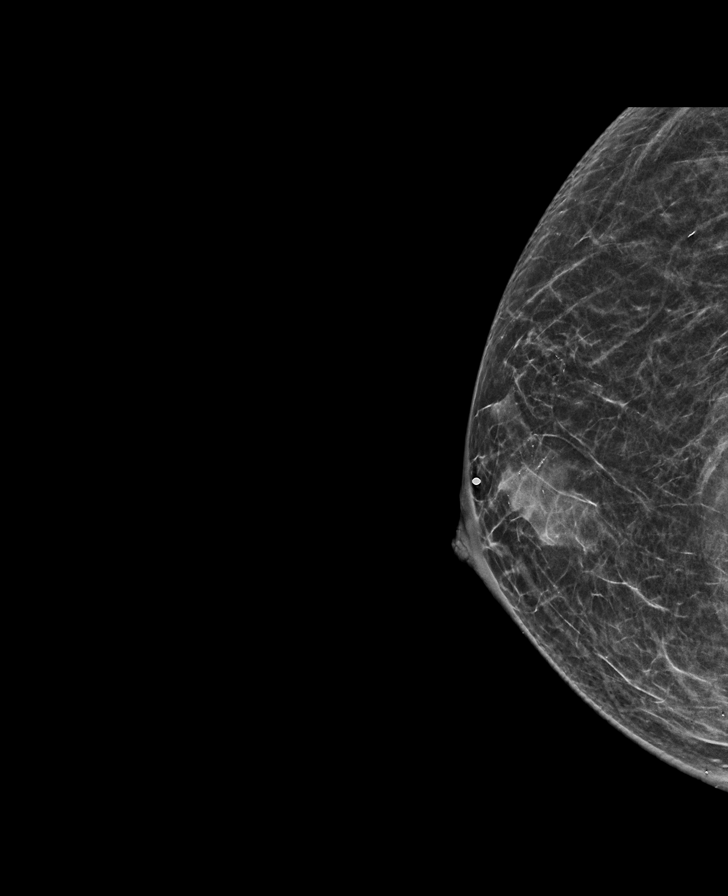

[L MLO (1 of 2)]
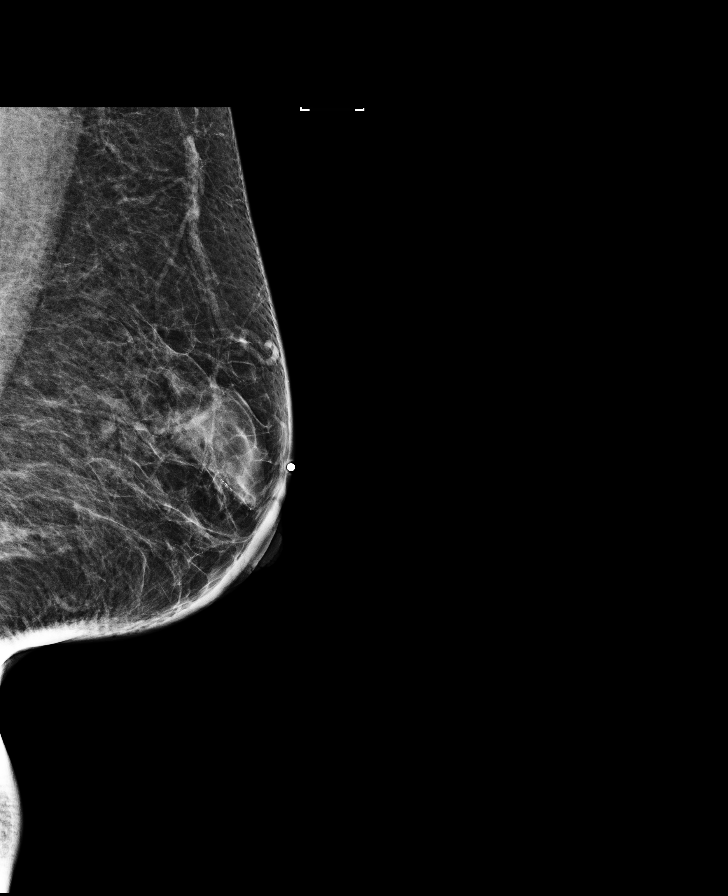

[L MLO (2 of 2)]
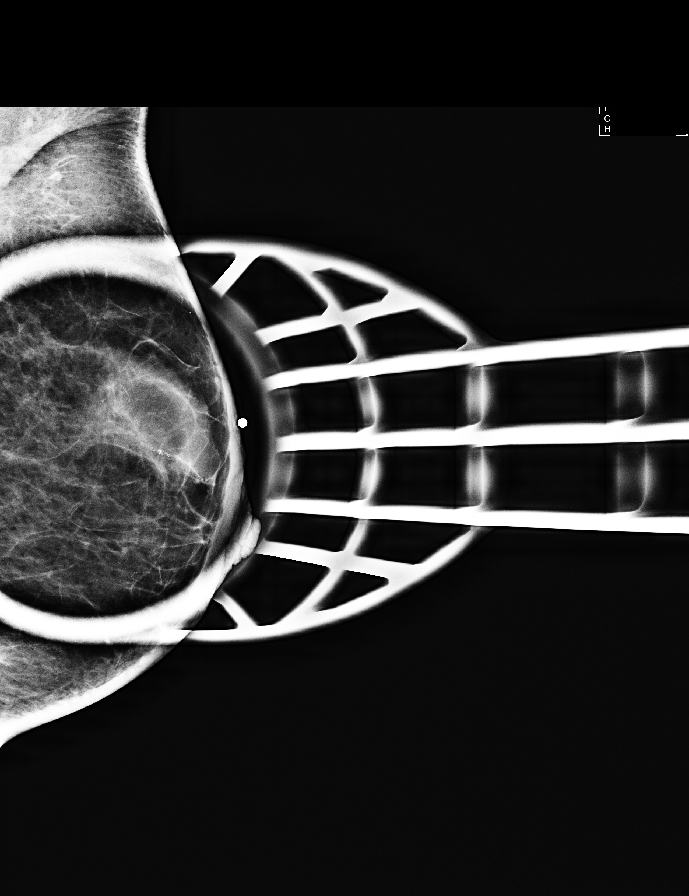

[L MLO synth-2D (2 of 2)]
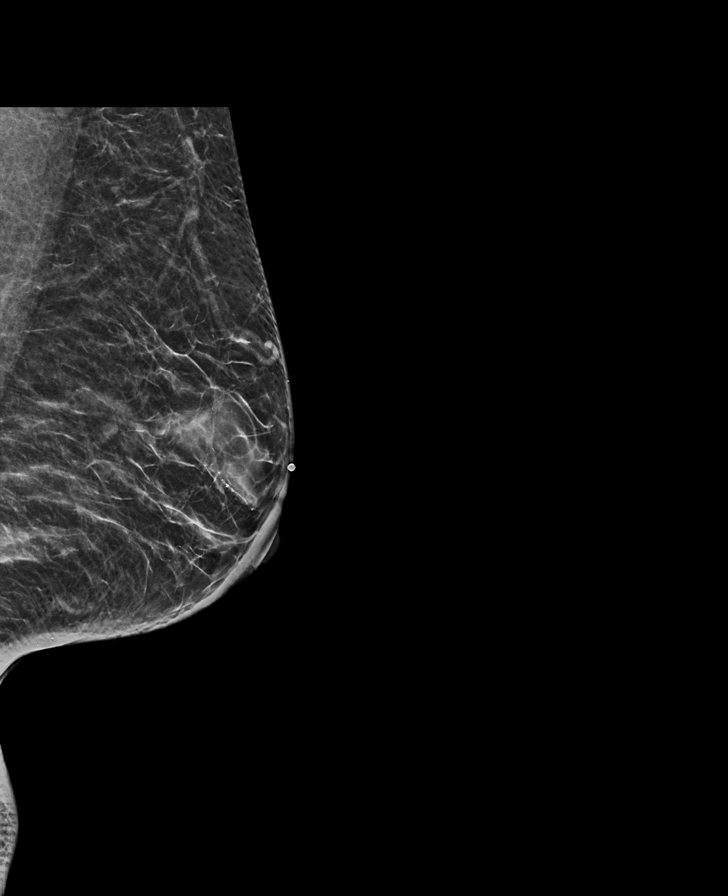

[8 of 40 positions shown; findings below may reference images not displayed]

ACR Breast Density Category c: The breast tissue is heterogeneously
dense, which may obscure small masses.
FINDINGS: No suspicious mass, distortion, or microcalcifications are
identified to suggest presence of malignancy. A BB marks area of
patient's concern in the right breast, corresponding to island of
fibroglandular tissue in the upper-outer quadrant, retroareolar
region. BB marks area of patient's concern in the left breast,
corresponding to fibroglandular tissue in the upper-outer quadrant
retroareolar region.

Mammographic images were processed with CAD.

On physical exam, I palpate discrete soft mass in the upper-outer
quadrant of the right breast. I palpate a similar discrete soft mass
in the upper-outer quadrant of the left breast.

Targeted ultrasound is performed, showing normal appearing
fibroglandular tissue in the 11 o'clock location of the right breast
1 cm from the nipple. Normal appearing fibroglandular tissue is
imaged in the 1 o'clock location of the left breast 1 cm from the
nipple. No mass, distortion, or acoustic shadowing is demonstrated
with ultrasound.
IMPRESSION: Clinical findings are consistent with benign breast parenchyma
bilaterally. There is no evidence for malignancy in either breast.

RECOMMENDATION:
Screening mammogram is recommended in December 2016.

I have discussed the findings and recommendations with the patient.
Results were also provided in writing at the conclusion of the
visit. If applicable, a reminder letter will be sent to the patient
regarding the next appointment.

BI-RADS CATEGORY  1: Negative.

## 2018-09-10 ENCOUNTER — Other Ambulatory Visit: Payer: Self-pay | Admitting: Family Medicine

## 2018-09-10 DIAGNOSIS — F419 Anxiety disorder, unspecified: Secondary | ICD-10-CM

## 2018-09-15 ENCOUNTER — Other Ambulatory Visit: Payer: Self-pay | Admitting: *Deleted

## 2018-09-15 MED ORDER — ISOSORBIDE MONONITRATE ER 30 MG PO TB24: 30.0000 mg | ORAL_TABLET | Freq: Every day | ORAL | 2 refills | Status: DC

## 2018-09-15 NOTE — Telephone Encounter (Signed)
Please advise if ok to refill Isosorbide 30 mg tablet qd.

## 2018-09-27 ENCOUNTER — Other Ambulatory Visit: Payer: Self-pay

## 2018-09-27 ENCOUNTER — Other Ambulatory Visit: Payer: Self-pay | Admitting: Family Medicine

## 2018-09-27 ENCOUNTER — Encounter: Payer: Self-pay | Admitting: Family Medicine

## 2018-09-27 ENCOUNTER — Ambulatory Visit (INDEPENDENT_AMBULATORY_CARE_PROVIDER_SITE_OTHER): Payer: Medicare Other | Admitting: Family Medicine

## 2018-09-27 VITALS — BP 167/102 | Temp 99.8°F

## 2018-09-27 DIAGNOSIS — R1013 Epigastric pain: Secondary | ICD-10-CM

## 2018-09-27 DIAGNOSIS — R112 Nausea with vomiting, unspecified: Secondary | ICD-10-CM | POA: Diagnosis not present

## 2018-09-27 DIAGNOSIS — F333 Major depressive disorder, recurrent, severe with psychotic symptoms: Secondary | ICD-10-CM

## 2018-09-27 DIAGNOSIS — I255 Ischemic cardiomyopathy: Secondary | ICD-10-CM

## 2018-09-27 MED ORDER — VENLAFAXINE HCL ER 75 MG PO CP24: 75.0000 mg | ORAL_CAPSULE | Freq: Every day | ORAL | 5 refills | Status: DC

## 2018-09-27 NOTE — Patient Instructions (Signed)
.   Please review the attached list of medications and notify my office if there are any errors.   . Please bring all of your medications to every appointment so we can make sure that our medication list is the same as yours.   . It is especially important to get the annual flu vaccine this year. If you haven't had it already, please go to your pharmacy or call the office as soon as possible to schedule you flu shot.  

## 2018-09-27 NOTE — Progress Notes (Addendum)
Patient: [name] Female    DOB: 02-22-1970   48 y.o.   MRN: [id number] Visit Date: 09/27/2018  Today's Provider: [name], MD   Chief Complaint  Patient presents with  . Nausea    x 6 months   Subjective:     HPI Virtual Visit via Video Note  I connected with [place] on 09/27/18 at  9:20 AM EDT by a video enabled telemedicine application and verified that I am speaking with the correct person using two identifiers.  Location: Patient: home Provider: [facility].    I discussed the limitations of evaluation and management by telemedicine and the availability of in person appointments. The patient expressed understanding and agreed to proceed.  History of Present Illness: Nausea: Patient complains of intermittent nausea and vomiting for the past 6 months. Patient says her symptoms are worsening. She is unable to eat food or drink liquids. Patient denies any diarrhea. She has had pain in the epigastric region.  Patient was seen by Fenton Malling, PA-C back in 04/2018 for Gastroenteritis and functional diarrhea and prescribed Lomitil. She reports since then she has also developed persistent epigastric pain every time she eats. No blood or black tarry stool. No fevers or chills. Does not drink alcohol  Past Surgical History:  Procedure Laterality Date  . APPENDECTOMY    . CARDIAC CATHETERIZATION N/A 11/09/2014   Procedure: Coronary Angiography;  Surgeon: Minna Merritts, MD;  Location: [place] [facility];  Service: Cardiovascular;  Laterality: N/A;  . CARDIAC CATHETERIZATION N/A 11/12/2014   Procedure: Coronary Stent Intervention;  Surgeon: Isaias Cowman, MD;  Location: [place] [facility];  Service: Cardiovascular;  Laterality: N/A;  . CARDIAC CATHETERIZATION N/A 04/18/2015   Procedure: Left Heart Cath and Coronary Angiography;  Surgeon: Minna Merritts, MD;  Location: [place] [facility];  Service: Cardiovascular;   Laterality: N/A;  . CARDIAC CATHETERIZATION Left 06/04/2015   Procedure: Left Heart Cath and Coronary Angiography;  Surgeon: Wellington Hampshire, MD;  Location: [place] [facility];  Service: Cardiovascular;  Laterality: Left;  . CARDIAC CATHETERIZATION N/A 06/04/2015   Procedure: Coronary Stent Intervention;  Surgeon: Wellington Hampshire, MD;  Location: [place] [facility];  Service: Cardiovascular;  Laterality: N/A;  . CESAREAN SECTION  2001  . CHOLECYSTECTOMY N/A 11/18/2016   Procedure: LAPAROSCOPIC CHOLECYSTECTOMY WITH INTRAOPERATIVE CHOLANGIOGRAM;  Surgeon: Christene Lye, MD;  Location: [facility];  Service: General;  Laterality: N/A;  . COLONOSCOPY WITH PROPOFOL N/A 04/27/2016   Procedure: COLONOSCOPY WITH PROPOFOL;  Surgeon: [name], MD;  Location: [place];  Service: Endoscopy;  Laterality: N/A;  . COLONOSCOPY WITH PROPOFOL N/A 01/12/2018   Procedure: COLONOSCOPY WITH PROPOFOL;  Surgeon: Toledo, Benay Pike, MD;  Location: [facility];  Service: Endoscopy;  Laterality: N/A;  . CORONARY ANGIOPLASTY    . CORONARY BALLOON ANGIOPLASTY N/A 06/29/2017   Procedure: CORONARY BALLOON ANGIOPLASTY;  Surgeon: Wellington Hampshire, MD;  Location: [place] [facility];  Service: Cardiovascular;  Laterality: N/A;  . CORONARY BALLOON ANGIOPLASTY N/A 09/20/2017   Procedure: CORONARY BALLOON ANGIOPLASTY;  Surgeon: Wellington Hampshire, MD;  Location: [place] [facility];  Service: Cardiovascular;  Laterality: N/A;  . DILATION AND CURETTAGE OF UTERUS    . ESOPHAGOGASTRODUODENOSCOPY (EGD) WITH PROPOFOL N/A 09/14/2014   Procedure: ESOPHAGOGASTRODUODENOSCOPY (EGD) WITH PROPOFOL;  Surgeon: Josefine Class, MD;  Location: [facility];  Service: Endoscopy;  Laterality: N/A;  . ESOPHAGOGASTRODUODENOSCOPY (EGD) WITH  PROPOFOL N/A 04/27/2016   Procedure: ESOPHAGOGASTRODUODENOSCOPY (EGD) WITH PROPOFOL;  Surgeon: [name], MD;  Location: [place];  Service: Endoscopy;  Laterality: N/A;   Diabetic - oral meds  . ESOPHAGOGASTRODUODENOSCOPY (EGD) WITH PROPOFOL N/A 01/12/2018   Procedure: ESOPHAGOGASTRODUODENOSCOPY (EGD) WITH PROPOFOL;  Surgeon: Toledo, Benay Pike, MD;  Location: [facility];  Service: Endoscopy;  Laterality: N/A;  . GASTRIC BYPASS  09/2009   [facility]   . Left Carotid to sublcavian artery bypass w/ subclavian artery ligation     a. Performed @ [place].  . LEFT HEART CATH AND CORONARY ANGIOGRAPHY Left 06/29/2017   Procedure: LEFT HEART CATH AND CORONARY ANGIOGRAPHY;  Surgeon: Wellington Hampshire, MD;  Location: [place] [facility];  Service: Cardiovascular;  Laterality: Left;  . LEFT HEART CATH AND CORONARY ANGIOGRAPHY N/A 09/20/2017   Procedure: LEFT HEART CATH AND CORONARY ANGIOGRAPHY;  Surgeon: Wellington Hampshire, MD;  Location: [place] [facility];  Service: Cardiovascular;  Laterality: N/A;  . LEFT HEART CATH AND CORONARY ANGIOGRAPHY N/A 12/20/2017   Procedure: LEFT HEART CATH AND CORONARY ANGIOGRAPHY;  Surgeon: Wellington Hampshire, MD;  Location: [place] [facility];  Service: Cardiovascular;  Laterality: N/A;  . MELANOMA EXCISION  2016   Dr. Evorn Gong  . [place]  2002  . RIGHT OOPHORECTOMY    . SHOULDER ARTHROSCOPY WITH OPEN ROTATOR CUFF REPAIR Right 01/07/2016   Procedure: SHOULDER ARTHROSCOPY WITH DEBRIDMENT, SUBACHROMIAL DECOMPRESSION;  Surgeon: Corky Mull, MD;  Location: [facility];  Service: Orthopedics;  Laterality: Right;  . SHOULDER ARTHROSCOPY WITH OPEN ROTATOR CUFF REPAIR Right 03/16/2017   Procedure: SHOULDER ARTHROSCOPY WITH OPEN ROTATOR CUFF REPAIR POSSIBLE BICEPS TENODESIS;  Surgeon: Corky Mull, MD;  Location: [facility];  Service: Orthopedics;  Laterality: Right;  . TRIGGER FINGER RELEASE Right     Middle Finger      Allergies  Allergen Reactions  . Lipitor [Atorvastatin] Other (See Comments)    Leg pains  . Tramadol Other (See Comments)    Mouth feels like it's on fire  . Lactose Intolerance (Gi) Nausea Only      Current Outpatient Medications:  .  ALPRAZolam (XANAX) 1 MG tablet, TAKE 1 TABLET BY MOUTH EVERY DAY AS NEEDED FOR ANXIETY, Disp: 30 tablet, Rfl: 3 .  Blood Glucose Monitoring Suppl (ONE TOUCH ULTRA 2) w/Device KIT, Use to check blood sugar once a day. Dx. E11.9, Disp: 1 each, Rfl: 0 .  buPROPion (WELLBUTRIN XL) 150 MG 24 hr tablet, Take 1 tablet (150 mg total) by mouth daily., Disp: 30 tablet, Rfl: 5 .  clopidogrel (PLAVIX) 75 MG tablet, Take 1 tablet (75 mg total) by mouth daily., Disp: 90 tablet, Rfl: 3 .  diclofenac sodium (VOLTAREN) 1 % GEL, Apply 2 g topically 3 (three) times daily as needed., Disp: 100 g, Rfl: 0 .  diphenoxylate-atropine (LOMOTIL) 2.5-0.025 MG tablet, Take 1 tablet by mouth 4 (four) times daily as needed for diarrhea or loose stools., Disp: , Rfl:  .  gabapentin (NEURONTIN) 300 MG capsule, TAKE 1 CAPSULE BY MOUTH TWICE A DAY, Disp: 180 capsule, Rfl: 4 .  isosorbide mononitrate (IMDUR) 30 MG 24 hr tablet, Take 1 tablet (30 mg total) by mouth daily., Disp: 30 tablet, Rfl: 2 .  levothyroxine (SYNTHROID, LEVOTHROID) 25 MCG tablet, Take 1 tablet (25 mcg total) by mouth daily before breakfast., Disp: 30 tablet, Rfl: 12 .  losartan (COZAAR) 25 MG tablet, Take 25 mg by mouth daily., Disp: , Rfl:  .  metoprolol succinate (TOPROL XL) 25 MG 24 hr tablet, Take 0.5 tablets (12.5 mg total) by mouth daily., Disp: , Rfl:  .  nitroGLYCERIN (NITROSTAT) 0.4 MG SL tablet, Place 1 tablet (0.4 mg total) under the tongue every 5 (five) minutes as needed for chest pain., Disp: 25 tablet, Rfl: 2 .  ondansetron (ZOFRAN) 4 MG tablet, Take 1 tablet (4 mg total) by mouth every 8 (eight) hours as needed., Disp: 20 tablet, Rfl: 0 .  pantoprazole (PROTONIX) 40 MG tablet, Take 1 tablet (40 mg total) by mouth 2 (two) times daily., Disp: 60 tablet, Rfl: 5 .  tiZANidine (ZANAFLEX) 4 MG tablet, Take 4 mg by mouth daily as needed for pain., Disp: , Rfl:  .  venlafaxine XR (EFFEXOR-XR) 75 MG 24 hr capsule,  Take 1 capsule (75 mg total) by mouth daily with breakfast., Disp: 30 capsule, Rfl: 2 .  rosuvastatin (CRESTOR) 40 MG tablet, Take 1 tablet (40 mg total) by mouth daily., Disp: 90 tablet, Rfl: 3  Review of Systems  Constitutional: Negative for appetite change, chills, fatigue and fever.  Respiratory: Negative for chest tightness and shortness of breath.   Cardiovascular: Negative for chest pain and palpitations.  Gastrointestinal: Positive for abdominal pain (epigastric pain), nausea and vomiting. Negative for abdominal distention.  Neurological: Positive for headaches. Negative for dizziness and weakness.    Social History   Tobacco Use  . Smoking status: Former Smoker    Types: Cigarettes    Quit date: 08/31/1994    Years since quitting: 24.0  . Smokeless tobacco: Never Used  . Tobacco comment: quit 28 years ago  Substance Use Topics  . Alcohol use: No    Alcohol/week: 0.0 standard drinks      Objective:   BP (!) 167/102   Temp 99.8 F (37.7 C) (Oral)  Vitals:   09/27/18 0909  BP: (!) 167/102  Temp: 99.8 F (37.7 C)  TempSrc: Oral     Physical Exam   General appearance: alert, well developed, well nourished, cooperative and in no distress Head: Normocephalic, without obvious abnormality, atraumatic Respiratory: Respirations even and unlabored, normal respiratory rate Extremities: All extremities are intact.  Skin: Skin color, texture, turgor normal. No rashes seen  Psych: Appropriate mood and affect. Neurologic: Mental status: Alert, oriented to person, place, and time, thought content appropriate.      Assessment & Plan     1. Nausea and vomiting, intractability of vomiting not specified, unspecified vomiting type  - Amylase - CBC - Comprehensive metabolic panel - H Pylori, IGM, IGG, IGA AB - US Abdomen Complete; Future  2. Epigastric pain  - Amylase - CBC - Comprehensive metabolic panel - H Pylori, IGM, IGG, IGA AB - US Abdomen Complete; Future   3. Major depressive disorder, recurrent, severe with psychotic features ([name]) refill- venlafaxine XR (EFFEXOR-XR) 75 MG 24 hr capsule; Take 1 capsule (75 mg total) by mouth daily with breakfast.  Dispense: 30 capsule; Refill: 5  I discussed the assessment and treatment plan with the patient. The patient was provided an opportunity to ask questions and all were answered. The patient agreed with the plan and demonstrated an understanding of the instructions.   The patient was advised to call back or seek an in-person evaluation if the symptoms worsen or if the condition fails to improve as anticipated.  I provided 25 minutes of non-face-to-face time during this encounter. The entirety of the information documented in the History of Present Illness, Review of Systems and Physical Exam  were personally obtained by me. Portions of this information were initially documented by Meyer Cory, CMA and reviewed by me for thoroughness and accuracy.      [name], MD  [facility]

## 2018-09-29 DIAGNOSIS — R1013 Epigastric pain: Secondary | ICD-10-CM | POA: Diagnosis not present

## 2018-09-29 DIAGNOSIS — R110 Nausea: Secondary | ICD-10-CM | POA: Diagnosis not present

## 2018-09-29 DIAGNOSIS — F333 Major depressive disorder, recurrent, severe with psychotic symptoms: Secondary | ICD-10-CM | POA: Diagnosis not present

## 2018-09-29 DIAGNOSIS — R112 Nausea with vomiting, unspecified: Secondary | ICD-10-CM | POA: Diagnosis not present

## 2018-10-01 ENCOUNTER — Other Ambulatory Visit: Payer: Self-pay | Admitting: Family Medicine

## 2018-10-01 LAB — COMPREHENSIVE METABOLIC PANEL
ALT: 18 IU/L (ref 0–32)
AST: 28 IU/L (ref 0–40)
Albumin/Globulin Ratio: 1.3 (ref 1.2–2.2)
Albumin: 3.5 g/dL — ABNORMAL LOW (ref 3.8–4.8)
Alkaline Phosphatase: 137 IU/L — ABNORMAL HIGH (ref 39–117)
BUN/Creatinine Ratio: 12 (ref 9–23)
BUN: 26 mg/dL — ABNORMAL HIGH (ref 6–24)
Bilirubin Total: 0.5 mg/dL (ref 0.0–1.2)
CO2: 22 mmol/L (ref 20–29)
Calcium: 8.9 mg/dL (ref 8.7–10.2)
Chloride: 94 mmol/L — ABNORMAL LOW (ref 96–106)
Creatinine, Ser: 2.13 mg/dL — ABNORMAL HIGH (ref 0.57–1.00)
GFR calc Af Amer: 31 mL/min/{1.73_m2} — ABNORMAL LOW (ref >59)
GFR calc non Af Amer: 27 mL/min/{1.73_m2} — ABNORMAL LOW (ref >59)
Globulin, Total: 2.7 g/dL (ref 1.5–4.5)
Glucose: 132 mg/dL — ABNORMAL HIGH (ref 65–99)
Potassium: 4.2 mmol/L (ref 3.5–5.2)
Sodium: 135 mmol/L (ref 134–144)
Total Protein: 6.2 g/dL (ref 6.0–8.5)

## 2018-10-01 LAB — CBC
Hematocrit: 34.1 % (ref 34.0–46.6)
Hemoglobin: 10.4 g/dL — ABNORMAL LOW (ref 11.1–15.9)
MCH: 21.9 pg — ABNORMAL LOW (ref 26.6–33.0)
MCHC: 30.5 g/dL — ABNORMAL LOW (ref 31.5–35.7)
MCV: 72 fL — ABNORMAL LOW (ref 79–97)
Platelets: 154 10*3/uL (ref 150–450)
RBC: 4.75 x10E6/uL (ref 3.77–5.28)
RDW: 17.1 % — ABNORMAL HIGH (ref 11.7–15.4)
WBC: 9 10*3/uL (ref 3.4–10.8)

## 2018-10-01 LAB — H PYLORI, IGM, IGG, IGA AB
H pylori, IgM Abs: <9 units (ref 0.0–8.9)
H. pylori, IgA Abs: 12.9 units — ABNORMAL HIGH (ref 0.0–8.9)
H. pylori, IgG AbS: 0.1 Index Value (ref 0.00–0.79)

## 2018-10-01 LAB — AMYLASE: Amylase: 34 U/L (ref 31–110)

## 2018-10-01 MED ORDER — CLARITHROMYCIN 500 MG PO TABS: 500.0000 mg | ORAL_TABLET | Freq: Two times a day (BID) | ORAL | 0 refills | Status: DC

## 2018-10-01 MED ORDER — AMOXICILLIN 500 MG PO CAPS: 1000.0000 mg | ORAL_CAPSULE | Freq: Two times a day (BID) | ORAL | 0 refills | Status: DC

## 2018-10-03 ENCOUNTER — Telehealth: Payer: Self-pay

## 2018-10-03 NOTE — Telephone Encounter (Signed)
LMTCB 10/03/2018  Thanks,   -Mickel Baas

## 2018-10-03 NOTE — Telephone Encounter (Signed)
-----   Message from [name], MD sent at 10/01/2018  8:36 AM EDT ----- Patient has h. Pylori. Prescription for antibiotics was sent to [facility] over the weekend and a message was sent in My Chart. She needs to schedule follow up in 3 weeks for h. Pylori breath test to make sure it is cured.

## 2018-10-04 NOTE — Telephone Encounter (Signed)
Patient has been advised. KW 

## 2018-10-05 ENCOUNTER — Ambulatory Visit
Admission: RE | Admit: 2018-10-05 | Discharge: 2018-10-05 | Disposition: A | Discharge status: Home / Self Care | Payer: Medicare Other | Source: Ambulatory Visit | Attending: Family Medicine | Admitting: Family Medicine

## 2018-10-05 ENCOUNTER — Other Ambulatory Visit: Payer: Self-pay

## 2018-10-05 DIAGNOSIS — R112 Nausea with vomiting, unspecified: Secondary | ICD-10-CM | POA: Diagnosis not present

## 2018-10-05 DIAGNOSIS — R1013 Epigastric pain: Secondary | ICD-10-CM | POA: Insufficient documentation

## 2018-10-06 ENCOUNTER — Telehealth: Payer: Self-pay

## 2018-10-06 NOTE — Telephone Encounter (Signed)
LMTCB

## 2018-10-06 NOTE — Telephone Encounter (Signed)
-----   Message from [name], MD sent at 10/05/2018  2:00 PM EDT ----- Abdominal ultrasound is normal. Pain is probably all due to h. Pylori. Finish antibiotic as prescribed and return for follow breath test as scheduled.

## 2018-10-07 ENCOUNTER — Ambulatory Visit: Payer: Medicare Other | Admitting: Family Medicine

## 2018-10-07 NOTE — Telephone Encounter (Signed)
Patient advised.

## 2018-10-12 ENCOUNTER — Emergency Department (HOSPITAL_COMMUNITY): Payer: Medicare Other

## 2018-10-12 ENCOUNTER — Encounter: Payer: Self-pay | Admitting: Family Medicine

## 2018-10-12 ENCOUNTER — Inpatient Hospital Stay (HOSPITAL_COMMUNITY)
Admission: EM | Admit: 2018-10-12 | Discharge: 2018-10-18 | DRG: 481 | Disposition: A | Discharge status: Skilled Nursing Facility | Payer: Medicare Other | Attending: Internal Medicine | Admitting: Internal Medicine

## 2018-10-12 ENCOUNTER — Ambulatory Visit (INDEPENDENT_AMBULATORY_CARE_PROVIDER_SITE_OTHER): Payer: Medicare Other | Admitting: Family Medicine

## 2018-10-12 ENCOUNTER — Other Ambulatory Visit: Payer: Self-pay

## 2018-10-12 ENCOUNTER — Encounter (HOSPITAL_COMMUNITY): Payer: Self-pay

## 2018-10-12 VITALS — BP 110/64 | HR 88 | Temp 96.9°F | Resp 15 | Wt 153.2 lb

## 2018-10-12 DIAGNOSIS — I255 Ischemic cardiomyopathy: Secondary | ICD-10-CM | POA: Diagnosis not present

## 2018-10-12 DIAGNOSIS — F319 Bipolar disorder, unspecified: Secondary | ICD-10-CM | POA: Diagnosis present

## 2018-10-12 DIAGNOSIS — N189 Chronic kidney disease, unspecified: Secondary | ICD-10-CM

## 2018-10-12 DIAGNOSIS — Z79899 Other long term (current) drug therapy: Secondary | ICD-10-CM

## 2018-10-12 DIAGNOSIS — S7292XA Unspecified fracture of left femur, initial encounter for closed fracture: Secondary | ICD-10-CM | POA: Diagnosis present

## 2018-10-12 DIAGNOSIS — K2970 Gastritis, unspecified, without bleeding: Secondary | ICD-10-CM | POA: Diagnosis not present

## 2018-10-12 DIAGNOSIS — D509 Iron deficiency anemia, unspecified: Secondary | ICD-10-CM | POA: Diagnosis present

## 2018-10-12 DIAGNOSIS — Z9181 History of falling: Secondary | ICD-10-CM

## 2018-10-12 DIAGNOSIS — R1013 Epigastric pain: Secondary | ICD-10-CM

## 2018-10-12 DIAGNOSIS — Y92009 Unspecified place in unspecified non-institutional (private) residence as the place of occurrence of the external cause: Secondary | ICD-10-CM

## 2018-10-12 DIAGNOSIS — S72142A Displaced intertrochanteric fracture of left femur, initial encounter for closed fracture: Secondary | ICD-10-CM

## 2018-10-12 DIAGNOSIS — I130 Hypertensive heart and chronic kidney disease with heart failure and stage 1 through stage 4 chronic kidney disease, or unspecified chronic kidney disease: Secondary | ICD-10-CM | POA: Diagnosis present

## 2018-10-12 DIAGNOSIS — K580 Irritable bowel syndrome with diarrhea: Secondary | ICD-10-CM | POA: Diagnosis present

## 2018-10-12 DIAGNOSIS — E039 Hypothyroidism, unspecified: Secondary | ICD-10-CM | POA: Diagnosis present

## 2018-10-12 DIAGNOSIS — Z9049 Acquired absence of other specified parts of digestive tract: Secondary | ICD-10-CM

## 2018-10-12 DIAGNOSIS — W19XXXA Unspecified fall, initial encounter: Secondary | ICD-10-CM

## 2018-10-12 DIAGNOSIS — R2689 Other abnormalities of gait and mobility: Secondary | ICD-10-CM | POA: Diagnosis present

## 2018-10-12 DIAGNOSIS — Z888 Allergy status to other drugs, medicaments and biological substances status: Secondary | ICD-10-CM

## 2018-10-12 DIAGNOSIS — D62 Acute posthemorrhagic anemia: Secondary | ICD-10-CM | POA: Diagnosis not present

## 2018-10-12 DIAGNOSIS — Z741 Need for assistance with personal care: Secondary | ICD-10-CM | POA: Diagnosis present

## 2018-10-12 DIAGNOSIS — E739 Lactose intolerance, unspecified: Secondary | ICD-10-CM | POA: Diagnosis present

## 2018-10-12 DIAGNOSIS — M2550 Pain in unspecified joint: Secondary | ICD-10-CM | POA: Diagnosis not present

## 2018-10-12 DIAGNOSIS — R52 Pain, unspecified: Secondary | ICD-10-CM | POA: Diagnosis not present

## 2018-10-12 DIAGNOSIS — Z808 Family history of malignant neoplasm of other organs or systems: Secondary | ICD-10-CM

## 2018-10-12 DIAGNOSIS — R296 Repeated falls: Secondary | ICD-10-CM | POA: Diagnosis present

## 2018-10-12 DIAGNOSIS — I252 Old myocardial infarction: Secondary | ICD-10-CM

## 2018-10-12 DIAGNOSIS — S0990XA Unspecified injury of head, initial encounter: Secondary | ICD-10-CM | POA: Diagnosis not present

## 2018-10-12 DIAGNOSIS — Z823 Family history of stroke: Secondary | ICD-10-CM

## 2018-10-12 DIAGNOSIS — N183 Chronic kidney disease, stage 3 unspecified: Secondary | ICD-10-CM

## 2018-10-12 DIAGNOSIS — E1140 Type 2 diabetes mellitus with diabetic neuropathy, unspecified: Secondary | ICD-10-CM | POA: Diagnosis present

## 2018-10-12 DIAGNOSIS — I5022 Chronic systolic (congestive) heart failure: Secondary | ICD-10-CM | POA: Diagnosis present

## 2018-10-12 DIAGNOSIS — S72145A Nondisplaced intertrochanteric fracture of left femur, initial encounter for closed fracture: Principal | ICD-10-CM | POA: Diagnosis present

## 2018-10-12 DIAGNOSIS — Z955 Presence of coronary angioplasty implant and graft: Secondary | ICD-10-CM

## 2018-10-12 DIAGNOSIS — F419 Anxiety disorder, unspecified: Secondary | ICD-10-CM | POA: Diagnosis present

## 2018-10-12 DIAGNOSIS — Z85820 Personal history of malignant melanoma of skin: Secondary | ICD-10-CM

## 2018-10-12 DIAGNOSIS — Z419 Encounter for procedure for purposes other than remedying health state, unspecified: Secondary | ICD-10-CM

## 2018-10-12 DIAGNOSIS — R262 Difficulty in walking, not elsewhere classified: Secondary | ICD-10-CM | POA: Diagnosis present

## 2018-10-12 DIAGNOSIS — K5900 Constipation, unspecified: Secondary | ICD-10-CM | POA: Diagnosis not present

## 2018-10-12 DIAGNOSIS — R1312 Dysphagia, oropharyngeal phase: Secondary | ICD-10-CM | POA: Diagnosis present

## 2018-10-12 DIAGNOSIS — Z841 Family history of disorders of kidney and ureter: Secondary | ICD-10-CM

## 2018-10-12 DIAGNOSIS — G629 Polyneuropathy, unspecified: Secondary | ICD-10-CM | POA: Diagnosis not present

## 2018-10-12 DIAGNOSIS — Z800 Family history of malignant neoplasm of digestive organs: Secondary | ICD-10-CM

## 2018-10-12 DIAGNOSIS — E1122 Type 2 diabetes mellitus with diabetic chronic kidney disease: Secondary | ICD-10-CM | POA: Diagnosis present

## 2018-10-12 DIAGNOSIS — E785 Hyperlipidemia, unspecified: Secondary | ICD-10-CM | POA: Diagnosis present

## 2018-10-12 DIAGNOSIS — E11649 Type 2 diabetes mellitus with hypoglycemia without coma: Secondary | ICD-10-CM | POA: Diagnosis not present

## 2018-10-12 DIAGNOSIS — Z7902 Long term (current) use of antithrombotics/antiplatelets: Secondary | ICD-10-CM

## 2018-10-12 DIAGNOSIS — Z818 Family history of other mental and behavioral disorders: Secondary | ICD-10-CM

## 2018-10-12 DIAGNOSIS — K2950 Unspecified chronic gastritis without bleeding: Secondary | ICD-10-CM | POA: Diagnosis present

## 2018-10-12 DIAGNOSIS — Z8041 Family history of malignant neoplasm of ovary: Secondary | ICD-10-CM

## 2018-10-12 DIAGNOSIS — R58 Hemorrhage, not elsewhere classified: Secondary | ICD-10-CM | POA: Diagnosis not present

## 2018-10-12 DIAGNOSIS — G8929 Other chronic pain: Secondary | ICD-10-CM | POA: Diagnosis present

## 2018-10-12 DIAGNOSIS — R5381 Other malaise: Secondary | ICD-10-CM | POA: Diagnosis not present

## 2018-10-12 DIAGNOSIS — Z833 Family history of diabetes mellitus: Secondary | ICD-10-CM

## 2018-10-12 DIAGNOSIS — D649 Anemia, unspecified: Secondary | ICD-10-CM | POA: Diagnosis not present

## 2018-10-12 DIAGNOSIS — R0600 Dyspnea, unspecified: Secondary | ICD-10-CM

## 2018-10-12 DIAGNOSIS — M25519 Pain in unspecified shoulder: Secondary | ICD-10-CM | POA: Diagnosis not present

## 2018-10-12 DIAGNOSIS — Z8249 Family history of ischemic heart disease and other diseases of the circulatory system: Secondary | ICD-10-CM

## 2018-10-12 DIAGNOSIS — R41841 Cognitive communication deficit: Secondary | ICD-10-CM | POA: Diagnosis present

## 2018-10-12 DIAGNOSIS — F418 Other specified anxiety disorders: Secondary | ICD-10-CM | POA: Diagnosis present

## 2018-10-12 DIAGNOSIS — Z7401 Bed confinement status: Secondary | ICD-10-CM | POA: Diagnosis not present

## 2018-10-12 DIAGNOSIS — Z803 Family history of malignant neoplasm of breast: Secondary | ICD-10-CM

## 2018-10-12 DIAGNOSIS — I10 Essential (primary) hypertension: Secondary | ICD-10-CM | POA: Diagnosis not present

## 2018-10-12 DIAGNOSIS — Z792 Long term (current) use of antibiotics: Secondary | ICD-10-CM

## 2018-10-12 DIAGNOSIS — I25118 Atherosclerotic heart disease of native coronary artery with other forms of angina pectoris: Secondary | ICD-10-CM

## 2018-10-12 DIAGNOSIS — I129 Hypertensive chronic kidney disease with stage 1 through stage 4 chronic kidney disease, or unspecified chronic kidney disease: Secondary | ICD-10-CM | POA: Diagnosis not present

## 2018-10-12 DIAGNOSIS — Z7984 Long term (current) use of oral hypoglycemic drugs: Secondary | ICD-10-CM

## 2018-10-12 DIAGNOSIS — I6930 Unspecified sequelae of cerebral infarction: Secondary | ICD-10-CM | POA: Diagnosis not present

## 2018-10-12 DIAGNOSIS — Z20828 Contact with and (suspected) exposure to other viral communicable diseases: Secondary | ICD-10-CM | POA: Diagnosis present

## 2018-10-12 DIAGNOSIS — R0902 Hypoxemia: Secondary | ICD-10-CM | POA: Diagnosis not present

## 2018-10-12 DIAGNOSIS — S72142D Displaced intertrochanteric fracture of left femur, subsequent encounter for closed fracture with routine healing: Secondary | ICD-10-CM | POA: Diagnosis not present

## 2018-10-12 DIAGNOSIS — S199XXA Unspecified injury of neck, initial encounter: Secondary | ICD-10-CM | POA: Diagnosis not present

## 2018-10-12 DIAGNOSIS — W109XXA Fall (on) (from) unspecified stairs and steps, initial encounter: Secondary | ICD-10-CM | POA: Diagnosis present

## 2018-10-12 DIAGNOSIS — G35 Multiple sclerosis: Secondary | ICD-10-CM | POA: Diagnosis present

## 2018-10-12 DIAGNOSIS — W2209XA Striking against other stationary object, initial encounter: Secondary | ICD-10-CM | POA: Diagnosis present

## 2018-10-12 DIAGNOSIS — B9681 Helicobacter pylori [H. pylori] as the cause of diseases classified elsewhere: Secondary | ICD-10-CM

## 2018-10-12 DIAGNOSIS — I69354 Hemiplegia and hemiparesis following cerebral infarction affecting left non-dominant side: Secondary | ICD-10-CM | POA: Diagnosis not present

## 2018-10-12 DIAGNOSIS — D72829 Elevated white blood cell count, unspecified: Secondary | ICD-10-CM | POA: Diagnosis not present

## 2018-10-12 DIAGNOSIS — I2510 Atherosclerotic heart disease of native coronary artery without angina pectoris: Secondary | ICD-10-CM | POA: Diagnosis present

## 2018-10-12 DIAGNOSIS — Z79890 Hormone replacement therapy (postmenopausal): Secondary | ICD-10-CM

## 2018-10-12 DIAGNOSIS — S299XXA Unspecified injury of thorax, initial encounter: Secondary | ICD-10-CM | POA: Diagnosis not present

## 2018-10-12 DIAGNOSIS — S4992XA Unspecified injury of left shoulder and upper arm, initial encounter: Secondary | ICD-10-CM | POA: Diagnosis not present

## 2018-10-12 DIAGNOSIS — Z90721 Acquired absence of ovaries, unilateral: Secondary | ICD-10-CM

## 2018-10-12 DIAGNOSIS — Z87891 Personal history of nicotine dependence: Secondary | ICD-10-CM

## 2018-10-12 DIAGNOSIS — M6281 Muscle weakness (generalized): Secondary | ICD-10-CM | POA: Diagnosis present

## 2018-10-12 DIAGNOSIS — Z9884 Bariatric surgery status: Secondary | ICD-10-CM

## 2018-10-12 DIAGNOSIS — Z8349 Family history of other endocrine, nutritional and metabolic diseases: Secondary | ICD-10-CM

## 2018-10-12 DIAGNOSIS — Z8719 Personal history of other diseases of the digestive system: Secondary | ICD-10-CM

## 2018-10-12 DIAGNOSIS — R2681 Unsteadiness on feet: Secondary | ICD-10-CM | POA: Diagnosis present

## 2018-10-12 HISTORY — DX: Unspecified fracture of left femur, initial encounter for closed fracture: S72.92XA

## 2018-10-12 HISTORY — DX: Nondisplaced intertrochanteric fracture of left femur, initial encounter for closed fracture: S72.145A

## 2018-10-12 LAB — CBC
HCT: 32.4 % — ABNORMAL LOW (ref 36.0–46.0)
Hemoglobin: 9.7 g/dL — ABNORMAL LOW (ref 12.0–15.0)
MCH: 22.2 pg — ABNORMAL LOW (ref 26.0–34.0)
MCHC: 29.9 g/dL — ABNORMAL LOW (ref 30.0–36.0)
MCV: 74.1 fL — ABNORMAL LOW (ref 80.0–100.0)
Platelets: 190 10*3/uL (ref 150–400)
RBC: 4.37 MIL/uL (ref 3.87–5.11)
RDW: 17.7 % — ABNORMAL HIGH (ref 11.5–15.5)
WBC: 10.9 10*3/uL — ABNORMAL HIGH (ref 4.0–10.5)
nRBC: 0 % (ref 0.0–0.2)

## 2018-10-12 LAB — BASIC METABOLIC PANEL
Anion gap: 7 (ref 5–15)
BUN: 16 mg/dL (ref 6–20)
CO2: 25 mmol/L (ref 22–32)
Calcium: 8.5 mg/dL — ABNORMAL LOW (ref 8.9–10.3)
Chloride: 108 mmol/L (ref 98–111)
Creatinine, Ser: 1.81 mg/dL — ABNORMAL HIGH (ref 0.44–1.00)
GFR calc Af Amer: 37 mL/min — ABNORMAL LOW (ref >60)
GFR calc non Af Amer: 32 mL/min — ABNORMAL LOW (ref >60)
Glucose, Bld: 69 mg/dL — ABNORMAL LOW (ref 70–99)
Potassium: 4.2 mmol/L (ref 3.5–5.1)
Sodium: 140 mmol/L (ref 135–145)

## 2018-10-12 LAB — CBG MONITORING, ED: Glucose-Capillary: 128 mg/dL — ABNORMAL HIGH (ref 70–99)

## 2018-10-12 LAB — SARS CORONAVIRUS 2 BY RT PCR (HOSPITAL ORDER, PERFORMED IN ~~LOC~~ HOSPITAL LAB): SARS Coronavirus 2: NEGATIVE

## 2018-10-12 MED ORDER — MORPHINE SULFATE (PF) 2 MG/ML IV SOLN
0.5000 mg | INTRAVENOUS | Status: DC | PRN
  Administered 2018-10-12 – 2018-10-13 (×2): 0.5 mg via INTRAVENOUS
  Filled 2018-10-12 (×2): qty 1

## 2018-10-12 MED ORDER — MORPHINE SULFATE (PF) 4 MG/ML IV SOLN
5.0000 mg | Freq: Once | INTRAVENOUS | Status: AC
  Administered 2018-10-12: 5 mg via INTRAVENOUS
  Filled 2018-10-12: qty 2

## 2018-10-12 MED ORDER — TRANEXAMIC ACID-NACL 1000-0.7 MG/100ML-% IV SOLN
1000.0000 mg | INTRAVENOUS | Status: AC
  Administered 2018-10-13: 1000 mg via INTRAVENOUS

## 2018-10-12 MED ORDER — GABAPENTIN 300 MG PO CAPS: 300.0000 mg | ORAL_CAPSULE | Freq: Two times a day (BID) | ORAL | 4 refills | Status: DC

## 2018-10-12 MED ORDER — ROSUVASTATIN CALCIUM 40 MG PO TABS: 40.0000 mg | ORAL_TABLET | Freq: Every day | ORAL | 4 refills | Status: DC

## 2018-10-12 MED ORDER — HYDROCODONE-ACETAMINOPHEN 5-325 MG PO TABS
1.0000 | ORAL_TABLET | Freq: Four times a day (QID) | ORAL | Status: DC | PRN
  Administered 2018-10-12: 1 via ORAL
  Filled 2018-10-12: qty 1

## 2018-10-12 MED ORDER — TRANEXAMIC ACID 1000 MG/10ML IV SOLN: 2000.0000 mg | INTRAVENOUS | Status: DC

## 2018-10-12 MED ORDER — DIPHENOXYLATE-ATROPINE 2.5-0.025 MG PO TABS: ORAL_TABLET | ORAL | Status: DC

## 2018-10-12 MED ORDER — POVIDONE-IODINE 10 % EX SWAB
2.0000 "application " | Freq: Once | CUTANEOUS | Status: AC
  Administered 2018-10-13: 2 via TOPICAL

## 2018-10-12 MED ORDER — CEFAZOLIN SODIUM-DEXTROSE 2-4 GM/100ML-% IV SOLN
2.0000 g | INTRAVENOUS | Status: AC
  Administered 2018-10-13: 2 g via INTRAVENOUS
  Filled 2018-10-12: qty 100

## 2018-10-12 MED ORDER — ENSURE PRE-SURGERY PO LIQD
296.0000 mL | Freq: Once | ORAL | Status: AC
  Administered 2018-10-12: 296 mL via ORAL
  Filled 2018-10-12: qty 296

## 2018-10-12 NOTE — Consult Note (Signed)
ORTHOPAEDIC CONSULTATION  REQUESTING PHYSICIAN: Orene Desanctis, DO  Chief Complaint: Left intertrochanteric fracture  HPI: [name] is a 48 y.o. female who presents with left hip fracture s/p mechanical fall down some stairs after slipping while carrying groceries.  She has CKD III, CAD, CVA, DM.  The patient endorses severe pain in the left hip, that does not radiate, grinding in quality, worse with any movement, better with immobilization.  Denies LOC/fever/chills/nausea/vomiting.  Walks without assistive devices (walker, cane, wheelchair).  Lives independently.  Denies LOC, neck pain, abd pain.  Past Medical History:  Diagnosis Date   Acute pyelonephritis    Anemia    iron deficiency anemia   Aortic arch aneurysm ([place])    Bipolar disorder ([place])    BRCA negative 2014   CAD (coronary artery disease)    a. 08/2003 Cath: LAD 30-40-med Rx; b. 11/2014 PCI: LAD 170m(3.25x23 Xience Alpine DES); c. 06/2015 PCI: D1 (2.25x12 Resolute Integrity DES); d. 06/2017 PCI: Patent mLAD stent, D2 95 (PTCA); e. 09/2017 PCI: D2 99ost (CBA); d. 12/2017 Cath: LM nl, LAD 374m80d (small), D1 40ost, D2 95ost, LCX 40p, RCA 40ost/p->Med rx for D2 given restenosis.   CKD (chronic kidney disease), stage III (HCC)    Colon polyp    CV[place] (cere[place] vascular accident) ([place]   Left side weakness.    Diabetes ([place]   Family history of breast cancer    BRCA neg 2014   Gastric ulcer 04/27/2011   History of echocardiogram    a. 03/2017 Echo: EF 60-65%, no rwma; b. 02/2018 Echo: EF 60-65%, no rwma. Nl RV fxn. No cardiac source of emboli (admitted w/ stroke).   HTN (hypertension)    Hyperlipemia    Hypothyroid    Malignant melanoma of skin of scalp (HCC)    MI, acute, non ST segment elevation (HCC)    Neuromuscular disorder (HCC)    Orthostatic hypotension    S/P drug eluting coronary stent placement 06/04/2015   Sepsis ([place]   [place]   a. 02/2018 MRI: 70m14mate  acute/early subacute L medial frontal lobe inarct; b. 02/2018 MRA No large vessel occlusion or aneurysm. Mod to sev L P2 stenosis. thready L vertebral artery, diffusely dzs'd; c. 02/2018 Carotid U/S: <50% bilat ICA dzs.   Past Surgical History:  Procedure Laterality Date   APPENDECTOMY     CARDIAC CATHETERIZATION N/A 11/09/2014   Procedure: Coronary Angiography;  Surgeon: TimMinna MerrittsD;  [facility];  Service: Cardiovascular;  Laterality: N/A;   CARDIAC CATHETERIZATION N/A 11/12/2014   Procedure: Coronary Stent Intervention;  Surgeon: AleIsaias CowmanD;  [facility];  Service: Cardiovascular;  Laterality: N/A;   CARDIAC CATHETERIZATION N/A 04/18/2015   Procedure: Left Heart Cath and Coronary Angiography;  Surgeon: TimMinna MerrittsD;  [facility];  Service: Cardiovascular;  Laterality: N/A;   CARDIAC CATHETERIZATION Left 06/04/2015   Procedure: Left Heart Cath and Coronary Angiography;  Surgeon: MuhWellington HampshireD;  [facility];  Service: Cardiovascular;  Laterality: Left;   CARDIAC CATHETERIZATION N/A 06/04/2015   Procedure: Coronary Stent Intervention;  Surgeon: MuhWellington HampshireD;  [facility];  Service: Cardiovascular;  Laterality: N/A;   CESAREAN SECTION  2001   CHOLECYSTECTOMY N/A 11/18/2016   Procedure: LAPAROSCOPIC CHOLECYSTECTOMY WITH INTRAOPERATIVE CHOLANGIOGRAM;  Surgeon: SanChristene LyeD;  [facility];  Service: General;  Laterality: N/A;  COLONOSCOPY WITH PROPOFOL N/A 04/27/2016   Procedure: COLONOSCOPY WITH PROPOFOL;  Surgeon: [name], MD;  Location: [place];  Service: Endoscopy;  Laterality: N/A;   COLONOSCOPY WITH PROPOFOL N/A 01/12/2018   Procedure: COLONOSCOPY WITH PROPOFOL;  Surgeon: Toledo, Benay Pike, MD;  Location: [facility];  Service: Endoscopy;  Laterality: N/A;   CORONARY ANGIOPLASTY     CORONARY BALLOON ANGIOPLASTY  N/A 06/29/2017   Procedure: CORONARY BALLOON ANGIOPLASTY;  Surgeon: Wellington Hampshire, MD;  Location: [place] [facility];  Service: Cardiovascular;  Laterality: N/A;   CORONARY BALLOON ANGIOPLASTY N/A 09/20/2017   Procedure: CORONARY BALLOON ANGIOPLASTY;  Surgeon: Wellington Hampshire, MD;  Location: [place] [facility];  Service: Cardiovascular;  Laterality: N/A;   DILATION AND CURETTAGE OF UTERUS     ESOPHAGOGASTRODUODENOSCOPY (EGD) WITH PROPOFOL N/A 09/14/2014   Procedure: ESOPHAGOGASTRODUODENOSCOPY (EGD) WITH PROPOFOL;  Surgeon: Josefine Class, MD;  [facility];  Service: Endoscopy;  Laterality: N/A;   ESOPHAGOGASTRODUODENOSCOPY (EGD) WITH PROPOFOL N/A 04/27/2016   Procedure: ESOPHAGOGASTRODUODENOSCOPY (EGD) WITH PROPOFOL;  Surgeon: [name], MD;  Location: [place];  Service: Endoscopy;  Laterality: N/A;  Diabetic - oral meds   ESOPHAGOGASTRODUODENOSCOPY (EGD) WITH PROPOFOL N/A 01/12/2018   Procedure: ESOPHAGOGASTRODUODENOSCOPY (EGD) WITH PROPOFOL;  Surgeon: Toledo, Benay Pike, MD;  Location: [facility];  Service: Endoscopy;  Laterality: N/A;   GASTRIC BYPASS  09/2009   [facility]    Left Carotid to sublcavian artery bypass w/ subclavian artery ligation     a. Performed @ [facility].   LEFT HEART CATH AND CORONARY ANGIOGRAPHY Left 06/29/2017   Procedure: LEFT HEART CATH AND CORONARY ANGIOGRAPHY;  Surgeon: Wellington Hampshire, MD;  Location: [place] [facility];  Service: Cardiovascular;  Laterality: Left;   LEFT HEART CATH AND CORONARY ANGIOGRAPHY N/A 09/20/2017   Procedure: LEFT HEART CATH AND CORONARY ANGIOGRAPHY;  Surgeon: Wellington Hampshire, MD;  Location: [place] [facility];  Service: Cardiovascular;  Laterality: N/A;   LEFT HEART CATH AND CORONARY ANGIOGRAPHY N/A 12/20/2017   Procedure: LEFT HEART CATH AND CORONARY ANGIOGRAPHY;  Surgeon: Wellington Hampshire, MD;  Location: [place] [facility];  Service: Cardiovascular;  Laterality:  N/A;   MELANOMA EXCISION  2016   Dr. Henreitta Cea ABLATION  2002   RIGHT OOPHORECTOMY     SHOULDER ARTHROSCOPY WITH OPEN ROTATOR CUFF REPAIR Right 01/07/2016   Procedure: SHOULDER ARTHROSCOPY WITH DEBRIDMENT, SUBACHROMIAL DECOMPRESSION;  Surgeon: Corky Mull, MD;  Location: [facility];  Service: Orthopedics;  Laterality: Right;   SHOULDER ARTHROSCOPY WITH OPEN ROTATOR CUFF REPAIR Right 03/16/2017   Procedure: SHOULDER ARTHROSCOPY WITH OPEN ROTATOR CUFF REPAIR POSSIBLE BICEPS TENODESIS;  Surgeon: Corky Mull, MD;  Location: [facility];  Service: Orthopedics;  Laterality: Right;   TRIGGER FINGER RELEASE Right     [place]   Social History   Socioeconomic History   Marital status: Divorced    Spouse name: Not on file   Number of children: 1   Years of education: Not on file   Highest education level: Not on file  Occupational History   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 Disabled through 08/02/2013 per forms 02/03/2013  Social Needs   Financial resource strain: Not hard at all   Food insecurity    Worry: Never true    Inability: Never true   Transportation needs    Medical: No  Non-medical: No  Tobacco Use   Smoking status: Former Smoker    Types: Cigarettes    Quit date: 08/31/1994    Years since quitting: 24.1   Smokeless tobacco: Never Used   Tobacco comment: quit 28 years ago  Substance and Sexual Activity   Alcohol use: No    Alcohol/week: 0.0 standard drinks   Drug use: No   Sexual activity: Not Currently    Birth control/protection: None  Lifestyle   Physical activity    Days per week: 0 days    Minutes per session: 0 min   Stress: Very much  Relationships   Social connections    Talks on phone: More than three times a week    Gets together: Once a week    Attends religious service: More than 4 times per year    Active member of club or  organization: No    Attends meetings of clubs or organizations: Not on file    Relationship status: Divorced  Other Topics Concern   Not on file  Social History Narrative   Previously did Scientist, clinical (histocompatibility and immunogenetics) work. 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   Lives at home with son   Family History  Problem Relation Age of Onset   Hypertension Mother    Anxiety disorder Mother    Depression Mother    Bipolar disorder Mother    Heart disease Mother        No details   Hyperlipidemia Mother    Kidney disease Father    Heart disease Father 72   Hypertension Father    Diabetes Father    Stroke Father    Colon cancer Father        dx in his 22's   Anxiety disorder Father    Depression Father    Skin cancer Father    Kidney disease Sister    Thyroid nodules Sister    Hypertension Sister    Hypertension Sister    Diabetes Sister    Hyperlipidemia Sister    Depression Sister    Breast cancer Maternal Aunt 25   Breast cancer Maternal Aunt 62   Ovarian cancer Cousin    Colon cancer Cousin    Breast cancer Other    Kidney cancer Neg Hx    Bladder Cancer Neg Hx    Allergies  Allergen Reactions   Lipitor [Atorvastatin] Other (See Comments)    Leg pains   Tramadol Other (See Comments)    Mouth feels like it's on fire   Lactose Intolerance (Gi) Nausea Only   Prior to Admission medications   Medication Sig Start Date End Date Taking? Authorizing Provider  ALPRAZolam (XANAX) 1 MG tablet TAKE 1 TABLET BY MOUTH EVERY DAY AS NEEDED FOR ANXIETY Patient taking differently: Take 1 mg by mouth as needed for anxiety.  09/11/18  Yes [name], MD  amoxicillin (AMOXIL) 500 MG capsule Take 2 capsules (1,000 mg total) by mouth 2 (two) times daily for 14 days. 10/01/18 10/15/18 Yes [name], MD  buPROPion (WELLBUTRIN XL) 150 MG 24 hr tablet TAKE 1 TABLET BY MOUTH EVERY DAY Patient taking differently: Take 150 mg by mouth daily.   09/27/18  Yes [name], MD  clarithromycin (BIAXIN) 500 MG tablet Take 1 tablet (500 mg total) by mouth 2 (two) times daily for 14 days. 10/01/18 10/15/18 Yes [name], MD  clopidogrel (PLAVIX) 75 MG tablet Take 1 tablet (75 mg total) by  mouth daily. 07/08/18  Yes Theora Gianotti, NP  diphenoxylate-atropine (LOMOTIL) 2.5-0.025 MG tablet Take one capsule 1-4 times daily as needed for diarrhea 10/12/18  Yes Fisher, Kirstie Peri, MD  gabapentin (NEURONTIN) 300 MG capsule Take 1 capsule (300 mg total) by mouth 2 (two) times daily. 10/12/18  Yes [name], MD  isosorbide mononitrate (IMDUR) 30 MG 24 hr tablet Take 1 tablet (30 mg total) by mouth daily. 09/15/18  Yes Minna Merritts, MD  levothyroxine (SYNTHROID, LEVOTHROID) 25 MCG tablet Take 1 tablet (25 mcg total) by mouth daily before breakfast. 03/28/18  Yes [name], MD  losartan (COZAAR) 25 MG tablet Take 25 mg by mouth daily.   Yes [provider]  metoprolol succinate (TOPROL XL) 25 MG 24 hr tablet Take 0.5 tablets (12.5 mg total) by mouth daily. 07/08/18 07/08/19 Yes Theora Gianotti, NP  nitroGLYCERIN (NITROSTAT) 0.4 MG SL tablet Place 1 tablet (0.4 mg total) under the tongue every 5 (five) minutes as needed for chest pain. 06/02/17  Yes Gollan, Kathlene November, MD  pantoprazole (PROTONIX) 40 MG tablet Take 1 tablet (40 mg total) by mouth 2 (two) times daily. 03/02/18  Yes [name], MD  promethazine (PHENERGAN) 25 MG tablet Take 25 mg by mouth every 6 (six) hours as needed for nausea or vomiting.   Yes [provider]  rosuvastatin (CRESTOR) 40 MG tablet Take 1 tablet (40 mg total) by mouth daily. 10/12/18  Yes [name], MD  tiZANidine (ZANAFLEX) 4 MG tablet Take 4 mg by mouth daily as needed for pain. 03/07/18  Yes [provider]  venlafaxine XR (EFFEXOR-XR) 75 MG 24 hr capsule Take 1 capsule (75 mg total) by mouth daily with breakfast. 09/27/18  Yes [name], MD  Blood  Glucose Monitoring Suppl (ONE TOUCH ULTRA 2) w/Device KIT Use to check blood sugar once a day. Dx. E11.9 03/18/17   [name], MD  diclofenac sodium (VOLTAREN) 1 % GEL Apply 2 g topically 3 (three) times daily as needed. Patient not taking: Reported on 10/12/2018 08/16/18   Karen Kitchens, NP  ondansetron (ZOFRAN) 4 MG tablet Take 1 tablet (4 mg total) by mouth every 8 (eight) hours as needed. Patient not taking: Reported on 10/12/2018 04/29/18   [place]   Dg Chest 1 View  Result Date: 10/12/2018 CLINICAL DATA:  Fall. EXAM: CHEST  1 VIEW COMPARISON:  Radiograph Jun 25, 2018. FINDINGS: Stable cardiomediastinal silhouette. No pneumothorax or pleural effusion is noted. Both lungs are clear. The visualized skeletal structures are unremarkable. IMPRESSION: No active disease. Electronically Signed   By: Marijo Conception M.D.   On: 10/12/2018 17:01   Dg Pelvis 1-2 Views  Result Date: 10/12/2018 CLINICAL DATA:  Fall. EXAM: PELVIS - 1-2 VIEW COMPARISON:  None. FINDINGS: Minimally displaced fracture is seen involving the intertrochanteric region of the proximal left femur. Right hip is unremarkable. Pelvis appears normal. IMPRESSION: Minimally displaced intertrochanteric fracture of the proximal left femur. Electronically Signed   By: Marijo Conception M.D.   On: 10/12/2018 16:58   Ct Head Wo Contrast  Result Date: 10/12/2018 CLINICAL DATA:  Status post fall backwards.  Hit her head on a car. EXAM: CT HEAD WITHOUT CONTRAST CT CERVICAL SPINE WITHOUT CONTRAST TECHNIQUE: Multidetector CT imaging of the head and cervical spine was performed following the standard protocol without intravenous contrast. Multiplanar CT image reconstructions of the cervical spine were also generated. COMPARISON:  06/05/2012 FINDINGS:  CT HEAD FINDINGS Brain: No evidence of acute infarction, hemorrhage, hydrocephalus, extra-axial collection or mass lesion/mass effect. Old right basal ganglia lacunar infarct. Periventricular  white matter low attenuation as can be seen with microvascular disease. Mild generalized cerebral atrophy. Vascular: No hyperdense vessel. Intracranial atherosclerotic disease. Skull: No osseous abnormality. Sinuses/Orbits: Visualized paranasal sinuses are clear. Visualized mastoid sinuses are clear. Visualized orbits demonstrate no focal abnormality. Other: None CT CERVICAL SPINE FINDINGS Alignment: Normal. Skull base and vertebrae: No acute fracture. No primary bone lesion or focal pathologic process. Soft tissues and spinal canal: No prevertebral fluid or swelling. No visible canal hematoma. Disc levels:  Disc spaces are maintained.  No foraminal stenosis. Upper chest: Lung apices are clear. Right-sided aortic arch with ligation of the left subclavian artery. Other: No fluid collection or hematoma. IMPRESSION: 1. No acute intracranial pathology. 2.  No acute osseous injury of the cervical spine. Electronically Signed   By: Kathreen Devoid   On: 10/12/2018 17:17   Ct Cervical Spine Wo Contrast  Result Date: 10/12/2018 CLINICAL DATA:  Status post fall backwards.  Hit her head on a car. EXAM: CT HEAD WITHOUT CONTRAST CT CERVICAL SPINE WITHOUT CONTRAST TECHNIQUE: Multidetector CT imaging of the head and cervical spine was performed following the standard protocol without intravenous contrast. Multiplanar CT image reconstructions of the cervical spine were also generated. COMPARISON:  06/05/2012 FINDINGS: CT HEAD FINDINGS Brain: No evidence of acute infarction, hemorrhage, hydrocephalus, extra-axial collection or mass lesion/mass effect. Old right basal ganglia lacunar infarct. Periventricular white matter low attenuation as can be seen with microvascular disease. Mild generalized cerebral atrophy. Vascular: No hyperdense vessel. Intracranial atherosclerotic disease. Skull: No osseous abnormality. Sinuses/Orbits: Visualized paranasal sinuses are clear. Visualized mastoid sinuses are clear. Visualized orbits  demonstrate no focal abnormality. Other: None CT CERVICAL SPINE FINDINGS Alignment: Normal. Skull base and vertebrae: No acute fracture. No primary bone lesion or focal pathologic process. Soft tissues and spinal canal: No prevertebral fluid or swelling. No visible canal hematoma. Disc levels:  Disc spaces are maintained.  No foraminal stenosis. Upper chest: Lung apices are clear. Right-sided aortic arch with ligation of the left subclavian artery. Other: No fluid collection or hematoma. IMPRESSION: 1. No acute intracranial pathology. 2.  No acute osseous injury of the cervical spine. Electronically Signed   By: Kathreen Devoid   On: 10/12/2018 17:17   Dg Shoulder Left  Result Date: 10/12/2018 CLINICAL DATA:  Status post fall backwards. EXAM: LEFT SHOULDER - 2+ VIEW COMPARISON:  None. FINDINGS: There is no fracture or dislocation. There is minimal osteoarthritis of the left glenohumeral joint. There are mild degenerative changes of the acromioclavicular joint. IMPRESSION: No acute osseous injury of the left shoulder. Electronically Signed   By: Kathreen Devoid   On: 10/12/2018 16:59   Dg Femur Min 2 Views Left  Result Date: 10/12/2018 CLINICAL DATA:  Fall. EXAM: LEFT FEMUR 2 VIEWS COMPARISON:  None. FINDINGS: Minimally displaced fracture is seen involving intertrochanteric region of proximal left femur. Vascular calcifications are noted. Remaining portion of femur is unremarkable. IMPRESSION: Minimally displaced intertrochanteric fracture of proximal left femur. Electronically Signed   By: Marijo Conception M.D.   On: 10/12/2018 16:59    All pertinent xrays, MRI, CT independently reviewed and interpreted  Positive ROS: All other systems have been reviewed and were otherwise negative with the exception of those mentioned in the HPI and as above.  Physical Exam: General: Alert, no acute distress Cardiovascular: No pedal edema Respiratory: No cyanosis, no use  of accessory musculature GI: No organomegaly,  abdomen is soft and non-tender Skin: No lesions in the area of chief complaint Neurologic: Sensation intact distally Psychiatric: Patient is competent for consent with normal mood and affect Lymphatic: No axillary or cervical lymphadenopathy  MUSCULOSKELETAL:  - severe pain with movement of the hip and extremity - skin intact - NVI distally - compartments soft  Assessment: Nondisplaced left intertrochanteric fracture  Plan: - surgical stabilization is recommended, patient is aware of r/b/a and wish to proceed, informed consent obtained - medical optimization per primary team - surgery is planned for tomorrow afternoon - NPO after midnight - hold lovenox for impending surgery  Thank you for the consult and the opportunity to see Ms. Harrah Eduard Roux, MD 8:59 PM

## 2018-10-12 NOTE — Progress Notes (Signed)
Patient: [name] Female    DOB: [birth year]   48 y.o.   MRN: [id number] Visit Date: 10/12/2018  Today's Provider: [name], MD   Chief Complaint  Patient presents with  . Follow-up   Subjective:     HPI  Follow up for H.Pylori Infection  The patient was last seen for this 2 weeks ago. Changes made at last visit include none, patient was advised to continue antibiotic and retest in 3 weeks. She just started antibiotics 9 days ago. Still  Nauseated, but epigastric pain is improved. Still feels very week and short of breath. Labs from last visit  Lab Results  Component Value Date   WBC 9.0 09/29/2018   HGB 10.4 (L) 09/29/2018   HCT 34.1 09/29/2018   MCV 72 (L) 09/29/2018   PLT 154 09/29/2018   Lab Results  Component Value Date   CREATININE 2.13 (H) 09/29/2018    She reports excellent compliance with treatment. She is not having side effects.      She also reports she has only been having BMs once a week and occasionally has to strain. Has been taking Lomotile four time a day every day for persistent diarrahea, but she continued it after diarrhea resolved. ------------------------------------------------------------------------------------  Allergies  Allergen Reactions  . Lipitor [Atorvastatin] Other (See Comments)    Leg pains  . Tramadol Other (See Comments)    Mouth feels like it's on fire  . Lactose Intolerance (Gi) Nausea Only     Current Outpatient Medications:  .  ALPRAZolam (XANAX) 1 MG tablet, TAKE 1 TABLET BY MOUTH EVERY DAY AS NEEDED FOR ANXIETY, Disp: 30 tablet, Rfl: 3 .  amoxicillin (AMOXIL) 500 MG capsule, Take 2 capsules (1,000 mg total) by mouth 2 (two) times daily for 14 days., Disp: 56 capsule, Rfl: 0 .  Blood Glucose Monitoring Suppl (ONE TOUCH ULTRA 2) w/Device KIT, Use to check blood sugar once a day. Dx. E11.9, Disp: 1 each, Rfl: 0 .  buPROPion (WELLBUTRIN XL) 150 MG 24 hr tablet, TAKE 1 TABLET BY MOUTH EVERY DAY,  Disp: 90 tablet, Rfl: 4 .  clarithromycin (BIAXIN) 500 MG tablet, Take 1 tablet (500 mg total) by mouth 2 (two) times daily for 14 days., Disp: 28 tablet, Rfl: 0 .  clopidogrel (PLAVIX) 75 MG tablet, Take 1 tablet (75 mg total) by mouth daily., Disp: 90 tablet, Rfl: 3 .  diclofenac sodium (VOLTAREN) 1 % GEL, Apply 2 g topically 3 (three) times daily as needed., Disp: 100 g, Rfl: 0 .  diphenoxylate-atropine (LOMOTIL) 2.5-0.025 MG tablet, Take 1 tablet by mouth 4 (four) times daily as needed for diarrhea or loose stools., Disp: , Rfl:  .  gabapentin (NEURONTIN) 300 MG capsule, TAKE 1 CAPSULE BY MOUTH TWICE A DAY, Disp: 180 capsule, Rfl: 4 .  isosorbide mononitrate (IMDUR) 30 MG 24 hr tablet, Take 1 tablet (30 mg total) by mouth daily., Disp: 30 tablet, Rfl: 2 .  levothyroxine (SYNTHROID, LEVOTHROID) 25 MCG tablet, Take 1 tablet (25 mcg total) by mouth daily before breakfast., Disp: 30 tablet, Rfl: 12 .  losartan (COZAAR) 25 MG tablet, Take 25 mg by mouth daily., Disp: , Rfl:  .  metoprolol succinate (TOPROL XL) 25 MG 24 hr tablet, Take 0.5 tablets (12.5 mg total) by mouth daily., Disp: , Rfl:  .  nitroGLYCERIN (NITROSTAT) 0.4 MG SL tablet, Place 1 tablet (0.4 mg total) under the tongue every 5 (five) minutes as needed for chest pain.,  Disp: 25 tablet, Rfl: 2 .  ondansetron (ZOFRAN) 4 MG tablet, Take 1 tablet (4 mg total) by mouth every 8 (eight) hours as needed., Disp: 20 tablet, Rfl: 0 .  pantoprazole (PROTONIX) 40 MG tablet, Take 1 tablet (40 mg total) by mouth 2 (two) times daily., Disp: 60 tablet, Rfl: 5 .  tiZANidine (ZANAFLEX) 4 MG tablet, Take 4 mg by mouth daily as needed for pain., Disp: , Rfl:  .  venlafaxine XR (EFFEXOR-XR) 75 MG 24 hr capsule, Take 1 capsule (75 mg total) by mouth daily with breakfast., Disp: 30 capsule, Rfl: 5 .  rosuvastatin (CRESTOR) 40 MG tablet, Take 1 tablet (40 mg total) by mouth daily., Disp: 90 tablet, Rfl: 3  Review of Systems  Constitutional: Positive for  fatigue. Negative for appetite change, chills and fever.  Respiratory: Positive for shortness of breath. Negative for chest tightness.   Cardiovascular: Negative for chest pain and palpitations.  Gastrointestinal: Positive for abdominal pain and nausea. Negative for vomiting.  Neurological: Positive for weakness. Negative for dizziness.    Social History   Tobacco Use  . Smoking status: Former Smoker    Types: Cigarettes    Quit date: 08/31/1994    Years since quitting: 24.1  . Smokeless tobacco: Never Used  . Tobacco comment: quit 28 years ago  Substance Use Topics  . Alcohol use: No    Alcohol/week: 0.0 standard drinks      Objective:   BP 110/64   Pulse 88   Temp (!) 96.9 F (36.1 C) (Oral)   Resp 15   Wt 153 lb 3.2 oz (69.5 kg)   SpO2 99%   BMI 27.14 kg/m  Vitals:   10/12/18 0905  BP: 110/64  Pulse: 88  Resp: 15  Temp: (!) 96.9 F (36.1 C)  TempSrc: Oral  SpO2: 99%  Weight: 153 lb 3.2 oz (69.5 kg)  Body mass index is 27.14 kg/m.   Physical Exam   General Appearance:    Alert, cooperative, no distress  Eyes:    PERRL, conjunctiva/corneas clear, EOM's intact       Lungs:     Clear to auscultation bilaterally, respirations unlabored  Heart:    Normal heart rate. Normal rhythm. No murmurs, rubs, or gallops.   MS:   All extremities are intact, but generally weak, slow to get up from chair and requires assistance to get on exam table.  Neurologic:   Awake, alert, oriented x 3. No apparent focal neurological           defect.             Assessment & Plan     1. Helicobacter pylori gastritis Only been on triple therapy for 9 days. She is to finish regiment and advised to go to lab in 10 days to have h. Pylori breath test.   2. Epigastric pain Improved on triple therapy for h. pylori  3. Anemia, unspecified type Likely slow gastric bleed from gastritis.  - CBC - Fe+TIBC+Fer  4. Dyspnea, unspecified type  - Comprehensive metabolic panel - Brain  natriuretic peptide  5. . Constipation, unspecified constipation type Likely from scheduled doses of Lomotile. Advised that this is only to be taken prn for diarrhea, which was thought to be secondary to IBS. Advised that if diarrhea resumes to just start back one daily and increase as needed to maximum prescribed dose only if diarrhea persists.   7. Type 2 diabetes mellitus with stage 3 chronic kidney disease, without  long-term current use of insulin (HCC)  - Hemoglobin A1c  Refill - gabapentin (NEURONTIN) 300 MG capsule; Take 1 capsule (300 mg total) by mouth 2 (two) times daily.  Dispense: 180 capsule; Refill: 4 for neuropathy and   - rosuvastatin (CRESTOR) 40 MG tablet; Take 1 tablet (40 mg total) by mouth daily.  Dispense: 90 tablet; Refill: 4 for multisystemic vascular disease.    The entirety of the information documented in the History of Present Illness, Review of Systems and Physical Exam were personally obtained by me. Portions of this information were initially documented by Minette Headland, CMA and reviewed by me for thoroughness and accuracy.      [name], MD  [facility] Clint Bolder as a scribe for [name], MD.,have documented all relevant documentation on the behalf of [name], MD,as directed by  [name], MD while in the presence of [name], MD.

## 2018-10-12 NOTE — ED Triage Notes (Signed)
Pt BIB [facility] from home. Pt states that she was walking up her steps (approx. 3) when she fell backward and hit her head on her car. Pt does not remember the event however. Pt complaining of left hip and shoulder pain. CBG 113. Pt received a total of 167mcg fentanyl via EMS. VSS.

## 2018-10-12 NOTE — ED Provider Notes (Addendum)
[facility] EMERGENCY DEPARTMENT Provider Note   CSN: [id number] Arrival date & time: 10/12/18  1346  History   Chief Complaint Chief Complaint  Patient presents with  . Fall    HPI [name] is a 48 y.o. female with Hx of DM, CKDIII, CAD, CVA with residual left sided weakness presenting to ED via EMS after a fall at home. History obtained by patient at bedside. She is in C-collar and appears to be in pain. She reports that she was carrying groceries up her steps, slipped, and fell back one step and 5 feet back. She landed on her left side and reports that she hit her head on a rock. No bleeding noted and patient cannot recall if she lost consciousness but reports that she was yelling for help and then sounded the car alarm, prompting her son's friend to the scene and he called EMS. Patient denies any dizziness prior to losing her balance.    Past Medical History:  Diagnosis Date  . Acute pyelonephritis   . Anemia    iron deficiency anemia  . Aortic arch aneurysm ([place])   . Bipolar disorder ([place])   . BRCA negative 2014  . CAD (coronary artery disease)    a. 08/2003 Cath: LAD 30-40-med Rx; b. 11/2014 PCI: LAD 32m(3.25x23 Xience Alpine DES); c. 06/2015 PCI: D1 (2.25x12 Resolute Integrity DES); d. 06/2017 PCI: Patent mLAD stent, D2 95 (PTCA); e. 09/2017 PCI: D2 99ost (CBA); d. 12/2017 Cath: LM nl, LAD 374m80d (small), D1 40ost, D2 95ost, LCX 40p, RCA 40ost/p->Med rx for D2 given restenosis.  . CKD (chronic kidney disease), stage III (HCPirtleville  . Colon polyp   . CVA (cerebral vascular accident) (HCSouth [place]   Left side weakness.   . Diabetes (HCLa Fontaine  . Family history of breast cancer    BRCA neg 2014  . Gastric ulcer 04/27/2011  . History of echocardiogram    a. 03/2017 Echo: EF 60-65%, no rwma; b. 02/2018 Echo: EF 60-65%, no rwma. Nl RV fxn. No cardiac source of emboli (admitted w/ stroke).  . Marland KitchenTN (hypertension)   . Hyperlipemia   . Hypothyroid   . Malignant melanoma  of skin of scalp (HCEly  . MI, acute, non ST segment elevation (HCTabor  . Neuromuscular disorder (HCWhalan  . Orthostatic hypotension   . S/P drug eluting coronary stent placement 06/04/2015  . Sepsis (HCLos Alvarez2/14/2019  . St[facility] ([facility]   a. 02/2018 MRI: 74m86mate acute/early subacute L medial frontal lobe inarct; b. 02/2018 MRA No large vessel occlusion or aneurysm. Mod to sev L P2 stenosis. thready L vertebral artery, diffusely dzs'd; c. 02/2018 Carotid U/S: <50% bilat ICA dzs.    Patient Active Problem List   Diagnosis Date Noted  . Hypotension 06/26/2018  . Autonomic neuropathy 03/24/2018  . Acute delirium 03/03/2018  . H/O gastric bypass 03/02/2018  . Hypertension associated with stage 3 chronic kidney disease due to type 2 diabetes mellitus (HCCCrescent City1/19/2020  . SI (sacroiliac) joint dysfunction 12/02/2017  . MI, acute, non ST segment elevation (HCCBellflower . Effort angina (HCCLyman5/28/2019  . Unstable angina (HCCSheridan Lake5/23/2019  . Syncope 04/09/2017  . Insomnia 03/18/2017  . Ischemic cardiomyopathy   . Arthritis   . Anxiety   . Tendinitis of upper biceps tendon of right shoulder 03/16/2017  . Degenerative tear of glenoid labrum of right shoulder 03/15/2017  . Acute colitis 01/27/2017  . Hx of colonic  polyps   . H/O medication noncompliance 12/14/2015  . Emesis   . Atherosclerosis of native coronary artery of native heart with stable angina pectoris ([place])   . Hypertensive heart disease   . CKD (chronic kidney disease), stage III ([place]) 06/05/2015  . Status post bariatric surgery 06/04/2015  . Colitis 06/03/2015  . Carotid stenosis 04/30/2015  . Type 2 diabetes mellitus with stage 3 chronic kidney disease, without long-term current use of insulin ([place])   . Stable angina pectoris ([place]) 04/17/2015  . Iron deficiency anemia 03/22/2015  . Vitamin B12 deficiency 02/18/2015  . Misuse of medications for pain 02/18/2015  . Major depressive disorder, recurrent, severe with psychotic features ([place])  02/15/2015  . Helicobacter pylori infection 11/23/2014  . Hemiparesis, left ([place]) 11/23/2014  . Benign neoplasm of colon 11/20/2014  . Malignant melanoma ([place]) 08/25/2014  . Chronic systolic CHF (congestive heart failure) ([place])   . Incomplete bladder emptying 07/12/2014  . Adult hypothyroidism 12/30/2013  . Aberrant subclavian artery 11/17/2013  . Multiple sclerosis ([place]) 11/02/2013  . History of CVA (cerebrovascular accident) 06/20/2013  . Headache, migraine 05/29/2013  . Hyperlipidemia   . GERD (gastroesophageal reflux disease)   . Neuropathy ([place]) 01/02/2011  . Stroke ([place]) 06/21/2008  . Essential hypertension 05/01/2008    Past Surgical History:  Procedure Laterality Date  . APPENDECTOMY    . CARDIAC CATHETERIZATION N/A 11/09/2014   Procedure: Coronary Angiography;  Surgeon: Minna Merritts, MD;  Location: [facility] CV LAB;  Service: Cardiovascular;  Laterality: N/A;  . CARDIAC CATHETERIZATION N/A 11/12/2014   Procedure: Coronary Stent Intervention;  Surgeon: Isaias Cowman, MD;  Location: [facility] CV LAB;  Service: Cardiovascular;  Laterality: N/A;  . CARDIAC CATHETERIZATION N/A 04/18/2015   Procedure: Left Heart Cath and Coronary Angiography;  Surgeon: Minna Merritts, MD;  Location: [facility] CV LAB;  Service: Cardiovascular;  Laterality: N/A;  . CARDIAC CATHETERIZATION Left 06/04/2015   Procedure: Left Heart Cath and Coronary Angiography;  Surgeon: Wellington Hampshire, MD;  Location: [facility] CV LAB;  Service: Cardiovascular;  Laterality: Left;  . CARDIAC CATHETERIZATION N/A 06/04/2015   Procedure: Coronary Stent Intervention;  Surgeon: Wellington Hampshire, MD;  Location: [facility] CV LAB;  Service: Cardiovascular;  Laterality: N/A;  . CESAREAN SECTION  2001  . CHOLECYSTECTOMY N/A 11/18/2016   Procedure: LAPAROSCOPIC CHOLECYSTECTOMY WITH INTRAOPERATIVE CHOLANGIOGRAM;  Surgeon: Christene Lye, MD;  Location: [facility] ORS;  Service: General;  Laterality:  N/A;  . COLONOSCOPY WITH PROPOFOL N/A 04/27/2016   Procedure: COLONOSCOPY WITH PROPOFOL;  Surgeon: [name], MD;  Location: [facility];  Service: Endoscopy;  Laterality: N/A;  . COLONOSCOPY WITH PROPOFOL N/A 01/12/2018   Procedure: COLONOSCOPY WITH PROPOFOL;  Surgeon: Toledo, Benay Pike, MD;  Location: [facility] ENDOSCOPY;  Service: Endoscopy;  Laterality: N/A;  . CORONARY ANGIOPLASTY    . CORONARY BALLOON ANGIOPLASTY N/A 06/29/2017   Procedure: CORONARY BALLOON ANGIOPLASTY;  Surgeon: Wellington Hampshire, MD;  Location: [facility] CV LAB;  Service: Cardiovascular;  Laterality: N/A;  . CORONARY BALLOON ANGIOPLASTY N/A 09/20/2017   Procedure: CORONARY BALLOON ANGIOPLASTY;  Surgeon: Wellington Hampshire, MD;  Location: [facility] CV LAB;  Service: Cardiovascular;  Laterality: N/A;  . DILATION AND CURETTAGE OF UTERUS    . ESOPHAGOGASTRODUODENOSCOPY (EGD) WITH PROPOFOL N/A 09/14/2014   Procedure: ESOPHAGOGASTRODUODENOSCOPY (EGD) WITH PROPOFOL;  Surgeon: Josefine Class, MD;  [facility];  Service: Endoscopy;  Laterality: N/A;  . ESOPHAGOGASTRODUODENOSCOPY (EGD) WITH PROPOFOL N/A 04/27/2016   Procedure: ESOPHAGOGASTRODUODENOSCOPY (  EGD) WITH PROPOFOL;  Surgeon: [name], MD;  Location: [facility];  Service: Endoscopy;  Laterality: N/A;  Diabetic - oral meds  . ESOPHAGOGASTRODUODENOSCOPY (EGD) WITH PROPOFOL N/A 01/12/2018   Procedure: ESOPHAGOGASTRODUODENOSCOPY (EGD) WITH PROPOFOL;  Surgeon: Toledo, Benay Pike, MD;  Location: [facility] ENDOSCOPY;  Service: Endoscopy;  Laterality: N/A;  . GASTRIC BYPASS  09/2009   [facility]   . Left Carotid to sublcavian artery bypass w/ subclavian artery ligation     a. Performed @ [facility].  . LEFT HEART CATH AND CORONARY ANGIOGRAPHY Left 06/29/2017   Procedure: LEFT HEART CATH AND CORONARY ANGIOGRAPHY;  Surgeon: Wellington Hampshire, MD;  Location: [facility] CV LAB;  Service: Cardiovascular;  Laterality: Left;  . LEFT HEART  CATH AND CORONARY ANGIOGRAPHY N/A 09/20/2017   Procedure: LEFT HEART CATH AND CORONARY ANGIOGRAPHY;  Surgeon: Wellington Hampshire, MD;  Location: [facility] CV LAB;  Service: Cardiovascular;  Laterality: N/A;  . LEFT HEART CATH AND CORONARY ANGIOGRAPHY N/A 12/20/2017   Procedure: LEFT HEART CATH AND CORONARY ANGIOGRAPHY;  Surgeon: Wellington Hampshire, MD;  Location: [facility] LAB;  Service: Cardiovascular;  Laterality: N/A;  . MELANOMA EXCISION  2016   Dr. Evorn Gong  . [place]  2002  . RIGHT OOPHORECTOMY    . SHOULDER ARTHROSCOPY WITH OPEN ROTATOR CUFF REPAIR Right 01/07/2016   Procedure: SHOULDER ARTHROSCOPY WITH DEBRIDMENT, SUBACHROMIAL DECOMPRESSION;  Surgeon: Corky Mull, MD;  Location: [facility] ORS;  Service: Orthopedics;  Laterality: Right;  . SHOULDER ARTHROSCOPY WITH OPEN ROTATOR CUFF REPAIR Right 03/16/2017   Procedure: SHOULDER ARTHROSCOPY WITH OPEN ROTATOR CUFF REPAIR POSSIBLE BICEPS TENODESIS;  Surgeon: Corky Mull, MD;  Location: [facility] ORS;  Service: Orthopedics;  Laterality: Right;  . TRIGGER FINGER RELEASE Right     Middle Finger     OB History    Gravida  7   Para  1   Term      Preterm      AB      Living        SAB      TAB      Ectopic      Multiple      Live Births               Home Medications    Prior to Admission medications   Medication Sig Start Date End Date Taking? Authorizing Provider  ALPRAZolam Duanne Moron) 1 MG tablet TAKE 1 TABLET BY MOUTH EVERY DAY AS NEEDED FOR ANXIETY 09/11/18   [name], MD  amoxicillin (AMOXIL) 500 MG capsule Take 2 capsules (1,000 mg total) by mouth 2 (two) times daily for 14 days. 10/01/18 10/15/18  [name], MD  Blood Glucose Monitoring Suppl (ONE TOUCH ULTRA 2) w/Device KIT Use to check blood sugar once a day. Dx. E11.9 03/18/17   [name], MD  buPROPion (WELLBUTRIN XL) 150 MG 24 hr tablet TAKE 1 TABLET BY MOUTH EVERY DAY 09/27/18   [name], MD  clarithromycin (BIAXIN) 500 MG  tablet Take 1 tablet (500 mg total) by mouth 2 (two) times daily for 14 days. 10/01/18 10/15/18  [name], MD  clopidogrel (PLAVIX) 75 MG tablet Take 1 tablet (75 mg total) by mouth daily. 07/08/18   Theora Gianotti, NP  diclofenac sodium (VOLTAREN) 1 % GEL Apply 2 g topically 3 (three) times daily as needed. 08/16/18   Karen Kitchens, NP  diphenoxylate-atropine (LOMOTIL) 2.5-0.025 MG tablet Take  one capsule 1-4 times daily as needed for diarrhea 10/12/18   [name], MD  gabapentin (NEURONTIN) 300 MG capsule Take 1 capsule (300 mg total) by mouth 2 (two) times daily. 10/12/18   [name], MD  isosorbide mononitrate (IMDUR) 30 MG 24 hr tablet Take 1 tablet (30 mg total) by mouth daily. 09/15/18   Minna Merritts, MD  levothyroxine (SYNTHROID, LEVOTHROID) 25 MCG tablet Take 1 tablet (25 mcg total) by mouth daily before breakfast. 03/28/18   [name], MD  losartan (COZAAR) 25 MG tablet Take 25 mg by mouth daily.    [provider]  metoprolol succinate (TOPROL XL) 25 MG 24 hr tablet Take 0.5 tablets (12.5 mg total) by mouth daily. 07/08/18 07/08/19  Theora Gianotti, NP  nitroGLYCERIN (NITROSTAT) 0.4 MG SL tablet Place 1 tablet (0.4 mg total) under the tongue every 5 (five) minutes as needed for chest pain. 06/02/17   Minna Merritts, MD  ondansetron (ZOFRAN) 4 MG tablet Take 1 tablet (4 mg total) by mouth every 8 (eight) hours as needed. 04/29/18   Mar Daring, PA-C  pantoprazole (PROTONIX) 40 MG tablet Take 1 tablet (40 mg total) by mouth 2 (two) times daily. 03/02/18   [name], MD  rosuvastatin (CRESTOR) 40 MG tablet Take 1 tablet (40 mg total) by mouth daily. 10/12/18   [name], MD  tiZANidine (ZANAFLEX) 4 MG tablet Take 4 mg by mouth daily as needed for pain. 03/07/18   [provider]  venlafaxine XR (EFFEXOR-XR) 75 MG 24 hr capsule Take 1 capsule (75 mg total) by mouth daily with breakfast. 09/27/18   [name], MD     Family History Family History  Problem Relation Age of Onset  . Hypertension Mother   . Anxiety disorder Mother   . Depression Mother   . Bipolar disorder Mother   . Heart disease Mother        No details  . Hyperlipidemia Mother   . Kidney disease Father   . Heart disease Father 57  . Hypertension Father   . Diabetes Father   . Stroke Father   . Colon cancer Father        dx in his 54's  . Anxiety disorder Father   . Depression Father   . Skin cancer Father   . Kidney disease Sister   . Thyroid nodules Sister   . Hypertension Sister   . Hypertension Sister   . Diabetes Sister   . Hyperlipidemia Sister   . Depression Sister   . Breast cancer Maternal Aunt 23  . Breast cancer Maternal Aunt 50  . Ovarian cancer Cousin   . Colon cancer Cousin   . Breast cancer Other   . Kidney cancer Neg Hx   . Bladder Cancer Neg Hx     Social History Social History   Tobacco Use  . Smoking status: Former Smoker    Types: Cigarettes    Quit date: 08/31/1994    Years since quitting: 24.1  . Smokeless tobacco: Never Used  . Tobacco comment: quit 28 years ago  Substance Use Topics  . Alcohol use: No    Alcohol/week: 0.0 standard drinks  . Drug use: No     Allergies   Lipitor [atorvastatin], Tramadol, and Lactose intolerance (gi)   Review of Systems Review of Systems  Constitutional: Negative for chills, fatigue and fever.  HENT: Negative for ear discharge, hearing loss, sinus pain, tinnitus and  trouble swallowing.   Eyes: Negative for photophobia, pain and visual disturbance.  Respiratory: Negative for cough, chest tightness, shortness of breath and wheezing.   Cardiovascular: Negative for chest pain.  Gastrointestinal: Negative for abdominal pain, constipation, diarrhea, nausea and vomiting.  Genitourinary: Negative for dysuria.  Musculoskeletal: Positive for arthralgias and myalgias. Negative for back pain, neck pain and neck stiffness.  Neurological: Positive for  weakness and headaches. Negative for dizziness, speech difficulty, light-headedness and numbness.  Hematological: Negative.   Psychiatric/Behavioral: Negative.   All other systems reviewed and are negative.   Physical Exam Updated Vital Signs There were no vitals taken for this visit.  Physical Exam Constitutional:      General: She is in acute distress.  HENT:     Head: Normocephalic and atraumatic.     Nose: Nose normal.     Mouth/Throat:     Mouth: Mucous membranes are moist.     Pharynx: Oropharynx is clear.  Eyes:     General: No scleral icterus.    Extraocular Movements: Extraocular movements intact.     Pupils: Pupils are equal, round, and reactive to light.  Cardiovascular:     Rate and Rhythm: Normal rate and regular rhythm.     Pulses: Normal pulses.     Heart sounds: No murmur. No friction rub. No gallop.   Pulmonary:     Effort: Pulmonary effort is normal. No respiratory distress.     Breath sounds: Normal breath sounds. No wheezing, rhonchi or rales.  Abdominal:     General: Bowel sounds are normal. There is no distension.     Palpations: Abdomen is soft.     Tenderness: There is no abdominal tenderness. There is no guarding.  Musculoskeletal:        General: Tenderness present.     Comments: ROM restricted of left hip and shoulder secondary to pain   Skin:    General: Skin is warm and dry.     Capillary Refill: Capillary refill takes less than 2 seconds.     Findings: Lesion present.     Comments: Left forearm: superficial abrasion on left forearm  Neurological:     General: No focal deficit present.     Mental Status: She is alert and oriented to person, place, and time.     Cranial Nerves: No cranial nerve deficit.     Sensory: No sensory deficit.      ED Treatments / Results  Labs (all labs ordered are listed, but only abnormal results are displayed) Labs Reviewed - No data to display  EKG None  Radiology No results found.  Procedures  Procedures (including critical care time)  Medications Ordered in ED Medications - No data to display   Initial Impression / Assessment and Plan / ED Course  I have reviewed the triage vital signs and the nursing notes.  Pertinent labs & imaging results that were available during my care of the patient were reviewed by me and considered in my medical decision making (see chart for details).  Patient is 48yo female with PMHx of multiple CVAs with residual left sided weakness presenting to ED after a mechanical fall while walking up the steps. She fell back 43f onto her left side and complains of left shoulder, left chest, left hip and femur pain. No chest pain, dyspnea, abdominal discomfort. Patient appears to be in pain. Morphine 541mordered.    Dr. YaDarl Householderill further evaluate patient and recommend dispo pending imaging studies.   Final  Clinical Impressions(s) / ED Diagnoses   Final diagnoses:  None    ED Discharge Orders    None       Harvie Heck, MD 10/12/18 1534    Harvie Heck, MD 10/12/18 1534    Lacretia Leigh, MD 10/13/18 [phone]

## 2018-10-12 NOTE — ED Provider Notes (Signed)
I saw and evaluated the patient, reviewed the resident's note and I agree with the findings and plan.  EKG:    48 year old female here for mechanical fall while walking up the steps of her house.  Unsure of LOC.  Patient complains of pain to her left shoulder, left hip and left femur.  Denies any abdominal discomfort.  Will order CT of head, C-spine.  Plain films of her chest.  Also of her hip.  And medicate for pain.  And signed out to next provider     Lacretia Leigh, MD 10/12/18 1517

## 2018-10-12 NOTE — Patient Instructions (Signed)
.   Please review the attached list of medications and notify my office if there are any errors.   . Please bring all of your medications to every appointment so we can make sure that our medication list is the same as yours.   . We will need to do a test to make sure h. Pylori is cured next week. This test involves breathing into a bag to collect expired air. Please go to the lab draw station in [address] on the second floor of [facility] anytime in the morning of Friday September 18th, 2020 to have this done.

## 2018-10-12 NOTE — Progress Notes (Signed)
I am aware of the patient and briefly discussed with Dr. Darl Householder.  Patient has multiple medical comorbidities.  Agree with admission to medicine for medical optimization and planned surgery for tomorrow.  N.p.o. after midnight.  Please hold Lovenox for impending surgery.  I will see the patient in the morning for full consult.

## 2018-10-12 NOTE — ED Provider Notes (Signed)
  Physical Exam  BP (!) 115/93   Pulse 85   Temp 98 F (36.7 C) (Oral)   Resp 15   SpO2 97%   Physical Exam  ED Course/Procedures     Procedures  MDM  Care assumed at 3 PM. Patient had a mechanical fall and landed on the left hip as well as hit her head.  Questionable left hip deformity and signout pending labs, CT head as well as x-rays.  7:24 PM Xray showed L inter trochanteric fracture. Talked to Dr. Erlinda Hong from ortho and he recommend NPO after midnight and he will perform surgery tomorrow. Hospitalist to admit.       Drenda Freeze, MD 10/12/18 [phone]

## 2018-10-13 ENCOUNTER — Inpatient Hospital Stay (HOSPITAL_COMMUNITY): Payer: Medicare Other | Admitting: Certified Registered"

## 2018-10-13 ENCOUNTER — Inpatient Hospital Stay (HOSPITAL_COMMUNITY): Payer: Medicare Other

## 2018-10-13 ENCOUNTER — Encounter (HOSPITAL_COMMUNITY)
Admission: EM | Disposition: A | Discharge status: Skilled Nursing Facility | Payer: Self-pay | Source: Home / Self Care | Attending: Internal Medicine

## 2018-10-13 ENCOUNTER — Encounter (HOSPITAL_COMMUNITY): Payer: Self-pay

## 2018-10-13 ENCOUNTER — Other Ambulatory Visit: Payer: Self-pay

## 2018-10-13 DIAGNOSIS — S72142A Displaced intertrochanteric fracture of left femur, initial encounter for closed fracture: Secondary | ICD-10-CM

## 2018-10-13 DIAGNOSIS — W19XXXA Unspecified fall, initial encounter: Secondary | ICD-10-CM

## 2018-10-13 DIAGNOSIS — I10 Essential (primary) hypertension: Secondary | ICD-10-CM

## 2018-10-13 DIAGNOSIS — D509 Iron deficiency anemia, unspecified: Secondary | ICD-10-CM

## 2018-10-13 DIAGNOSIS — I6930 Unspecified sequelae of cerebral infarction: Secondary | ICD-10-CM

## 2018-10-13 DIAGNOSIS — E039 Hypothyroidism, unspecified: Secondary | ICD-10-CM

## 2018-10-13 DIAGNOSIS — N189 Chronic kidney disease, unspecified: Secondary | ICD-10-CM

## 2018-10-13 DIAGNOSIS — F418 Other specified anxiety disorders: Secondary | ICD-10-CM

## 2018-10-13 HISTORY — PX: INTRAMEDULLARY (IM) NAIL INTERTROCHANTERIC: SHX5875

## 2018-10-13 LAB — BASIC METABOLIC PANEL
Anion gap: 9 (ref 5–15)
BUN: 16 mg/dL (ref 6–20)
CO2: 27 mmol/L (ref 22–32)
Calcium: 9 mg/dL (ref 8.9–10.3)
Chloride: 105 mmol/L (ref 98–111)
Creatinine, Ser: 1.64 mg/dL — ABNORMAL HIGH (ref 0.44–1.00)
GFR calc Af Amer: 42 mL/min — ABNORMAL LOW (ref >60)
GFR calc non Af Amer: 36 mL/min — ABNORMAL LOW (ref >60)
Glucose, Bld: 84 mg/dL (ref 70–99)
Potassium: 4.4 mmol/L (ref 3.5–5.1)
Sodium: 141 mmol/L (ref 135–145)

## 2018-10-13 LAB — CBC
HCT: 35.4 % — ABNORMAL LOW (ref 36.0–46.0)
Hemoglobin: 10.6 g/dL — ABNORMAL LOW (ref 12.0–15.0)
MCH: 21.9 pg — ABNORMAL LOW (ref 26.0–34.0)
MCHC: 29.9 g/dL — ABNORMAL LOW (ref 30.0–36.0)
MCV: 73.3 fL — ABNORMAL LOW (ref 80.0–100.0)
Platelets: 206 10*3/uL (ref 150–400)
RBC: 4.83 MIL/uL (ref 3.87–5.11)
RDW: 17.8 % — ABNORMAL HIGH (ref 11.5–15.5)
WBC: 9.4 10*3/uL (ref 4.0–10.5)
nRBC: 0 % (ref 0.0–0.2)

## 2018-10-13 LAB — GLUCOSE, CAPILLARY
Glucose-Capillary: 61 mg/dL — ABNORMAL LOW (ref 70–99)
Glucose-Capillary: 132 mg/dL — ABNORMAL HIGH (ref 70–99)
Glucose-Capillary: 118 mg/dL — ABNORMAL HIGH (ref 70–99)
Glucose-Capillary: 63 mg/dL — ABNORMAL LOW (ref 70–99)
Glucose-Capillary: 179 mg/dL — ABNORMAL HIGH (ref 70–99)
Glucose-Capillary: 252 mg/dL — ABNORMAL HIGH (ref 70–99)

## 2018-10-13 LAB — SURGICAL PCR SCREEN
MRSA, PCR: NEGATIVE
Staphylococcus aureus: NEGATIVE

## 2018-10-13 SURGERY — FIXATION, FRACTURE, INTERTROCHANTERIC, WITH INTRAMEDULLARY ROD
Anesthesia: General | Site: Hip | Laterality: Left

## 2018-10-13 MED ORDER — ALPRAZOLAM 0.5 MG PO TABS
1.0000 mg | ORAL_TABLET | Freq: Three times a day (TID) | ORAL | Status: DC | PRN
  Administered 2018-10-14 – 2018-10-18 (×9): 1 mg via ORAL
  Filled 2018-10-13 (×9): qty 2

## 2018-10-13 MED ORDER — MORPHINE SULFATE (PF) 2 MG/ML IV SOLN: 1.0000 mg | INTRAVENOUS | Status: DC | PRN

## 2018-10-13 MED ORDER — PROPOFOL 10 MG/ML IV BOLUS
INTRAVENOUS | Status: DC | PRN
  Administered 2018-10-13: 150 mg via INTRAVENOUS

## 2018-10-13 MED ORDER — AMOXICILLIN 500 MG PO CAPS
1000.0000 mg | ORAL_CAPSULE | Freq: Two times a day (BID) | ORAL | Status: AC
  Administered 2018-10-13 – 2018-10-17 (×9): 1000 mg via ORAL
  Filled 2018-10-13 (×9): qty 2

## 2018-10-13 MED ORDER — MIDAZOLAM HCL 5 MG/5ML IJ SOLN
INTRAMUSCULAR | Status: DC | PRN
  Administered 2018-10-13: 2 mg via INTRAVENOUS

## 2018-10-13 MED ORDER — LOSARTAN POTASSIUM 25 MG PO TABS
25.0000 mg | ORAL_TABLET | Freq: Every day | ORAL | Status: DC
  Administered 2018-10-13 – 2018-10-18 (×6): 25 mg via ORAL
  Filled 2018-10-13 (×6): qty 1

## 2018-10-13 MED ORDER — SODIUM CHLORIDE 0.9 % IV SOLN
INTRAVENOUS | Status: DC
  Administered 2018-10-13 – 2018-10-15 (×2): via INTRAVENOUS

## 2018-10-13 MED ORDER — PANTOPRAZOLE SODIUM 40 MG PO TBEC
40.0000 mg | DELAYED_RELEASE_TABLET | Freq: Two times a day (BID) | ORAL | Status: DC
  Administered 2018-10-13 – 2018-10-18 (×11): 40 mg via ORAL
  Filled 2018-10-13 (×11): qty 1

## 2018-10-13 MED ORDER — HYDROMORPHONE HCL 1 MG/ML IJ SOLN: 0.2500 mg | INTRAMUSCULAR | Status: DC | PRN

## 2018-10-13 MED ORDER — DEXTROSE 50 % IV SOLN
INTRAVENOUS | Status: AC
  Filled 2018-10-13: qty 50

## 2018-10-13 MED ORDER — METOPROLOL SUCCINATE ER 25 MG PO TB24
12.5000 mg | ORAL_TABLET | Freq: Every day | ORAL | Status: DC
  Administered 2018-10-13 – 2018-10-18 (×6): 12.5 mg via ORAL
  Filled 2018-10-13 (×6): qty 1

## 2018-10-13 MED ORDER — 0.9 % SODIUM CHLORIDE (POUR BTL) OPTIME
TOPICAL | Status: DC | PRN
  Administered 2018-10-13: 1000 mL

## 2018-10-13 MED ORDER — METHOCARBAMOL 500 MG PO TABS
500.0000 mg | ORAL_TABLET | Freq: Four times a day (QID) | ORAL | Status: DC | PRN
  Administered 2018-10-16 – 2018-10-18 (×5): 500 mg via ORAL
  Filled 2018-10-13 (×6): qty 1

## 2018-10-13 MED ORDER — DEXTROSE 50 % IV SOLN
1.0000 | Freq: Once | INTRAVENOUS | Status: AC
  Administered 2018-10-13: 16:00:00 50 mL via INTRAVENOUS

## 2018-10-13 MED ORDER — LACTATED RINGERS IV SOLN
INTRAVENOUS | Status: DC | PRN
  Administered 2018-10-13: 15:00:00 via INTRAVENOUS

## 2018-10-13 MED ORDER — DEXAMETHASONE SODIUM PHOSPHATE 10 MG/ML IJ SOLN
INTRAMUSCULAR | Status: DC | PRN
  Administered 2018-10-13: 10 mg via INTRAVENOUS

## 2018-10-13 MED ORDER — METHOCARBAMOL 1000 MG/10ML IJ SOLN
500.0000 mg | Freq: Four times a day (QID) | INTRAVENOUS | Status: DC | PRN
  Filled 2018-10-13 (×2): qty 5

## 2018-10-13 MED ORDER — HYDRALAZINE HCL 20 MG/ML IJ SOLN
10.0000 mg | Freq: Once | INTRAMUSCULAR | Status: AC
  Administered 2018-10-13: 03:00:00 10 mg via INTRAVENOUS
  Filled 2018-10-13: qty 1

## 2018-10-13 MED ORDER — ROSUVASTATIN CALCIUM 20 MG PO TABS
40.0000 mg | ORAL_TABLET | Freq: Every day | ORAL | Status: DC
  Administered 2018-10-13 – 2018-10-18 (×6): 40 mg via ORAL
  Filled 2018-10-13 (×6): qty 2

## 2018-10-13 MED ORDER — CLARITHROMYCIN 500 MG PO TABS
500.0000 mg | ORAL_TABLET | Freq: Two times a day (BID) | ORAL | Status: AC
  Administered 2018-10-13 – 2018-10-17 (×9): 500 mg via ORAL
  Filled 2018-10-13 (×9): qty 1

## 2018-10-13 MED ORDER — LIDOCAINE 2% (20 MG/ML) 5 ML SYRINGE
INTRAMUSCULAR | Status: AC
  Filled 2018-10-13: qty 5

## 2018-10-13 MED ORDER — MAGNESIUM CITRATE PO SOLN: 1.0000 | Freq: Once | ORAL | Status: DC | PRN

## 2018-10-13 MED ORDER — FENTANYL CITRATE (PF) 100 MCG/2ML IJ SOLN
INTRAMUSCULAR | Status: DC | PRN
  Administered 2018-10-13 (×3): 50 ug via INTRAVENOUS

## 2018-10-13 MED ORDER — ONDANSETRON HCL 4 MG/2ML IJ SOLN
INTRAMUSCULAR | Status: DC | PRN
  Administered 2018-10-13: 4 mg via INTRAVENOUS

## 2018-10-13 MED ORDER — SUGAMMADEX SODIUM 200 MG/2ML IV SOLN
INTRAVENOUS | Status: DC | PRN
  Administered 2018-10-13: 250 mg via INTRAVENOUS

## 2018-10-13 MED ORDER — CEFAZOLIN SODIUM-DEXTROSE 2-4 GM/100ML-% IV SOLN
2.0000 g | Freq: Four times a day (QID) | INTRAVENOUS | Status: AC
  Administered 2018-10-14 (×3): 2 g via INTRAVENOUS
  Filled 2018-10-13 (×3): qty 100

## 2018-10-13 MED ORDER — INSULIN ASPART 100 UNIT/ML ~~LOC~~ SOLN
0.0000 [IU] | Freq: Every day | SUBCUTANEOUS | Status: DC
  Administered 2018-10-13 – 2018-10-14 (×2): 3 [IU] via SUBCUTANEOUS

## 2018-10-13 MED ORDER — ROCURONIUM BROMIDE 50 MG/5ML IV SOSY
PREFILLED_SYRINGE | INTRAVENOUS | Status: DC | PRN
  Administered 2018-10-13: 20 mg via INTRAVENOUS
  Administered 2018-10-13: 30 mg via INTRAVENOUS

## 2018-10-13 MED ORDER — DEXTROSE 50 % IV SOLN
25.0000 mL | Freq: Once | INTRAVENOUS | Status: AC
  Administered 2018-10-13: 25 mL via INTRAVENOUS
  Filled 2018-10-13: qty 50

## 2018-10-13 MED ORDER — SUCCINYLCHOLINE CHLORIDE 200 MG/10ML IV SOSY
PREFILLED_SYRINGE | INTRAVENOUS | Status: DC | PRN
  Administered 2018-10-13: 100 mg via INTRAVENOUS

## 2018-10-13 MED ORDER — POLYETHYLENE GLYCOL 3350 17 G PO PACK: 17.0000 g | PACK | Freq: Every day | ORAL | Status: DC | PRN

## 2018-10-13 MED ORDER — GABAPENTIN 300 MG PO CAPS
300.0000 mg | ORAL_CAPSULE | Freq: Two times a day (BID) | ORAL | Status: DC
  Administered 2018-10-13 – 2018-10-18 (×11): 300 mg via ORAL
  Filled 2018-10-13 (×11): qty 1

## 2018-10-13 MED ORDER — ONDANSETRON HCL 4 MG PO TABS
4.0000 mg | ORAL_TABLET | Freq: Four times a day (QID) | ORAL | Status: DC | PRN
  Administered 2018-10-18: 4 mg via ORAL
  Filled 2018-10-13: qty 1

## 2018-10-13 MED ORDER — MIDAZOLAM HCL 2 MG/2ML IJ SOLN
INTRAMUSCULAR | Status: AC
  Filled 2018-10-13: qty 2

## 2018-10-13 MED ORDER — ONDANSETRON HCL 4 MG/2ML IJ SOLN: 4.0000 mg | Freq: Once | INTRAMUSCULAR | Status: DC | PRN

## 2018-10-13 MED ORDER — OXYCODONE-ACETAMINOPHEN 5-325 MG PO TABS: 1.0000 | ORAL_TABLET | Freq: Three times a day (TID) | ORAL | 0 refills | Status: DC | PRN

## 2018-10-13 MED ORDER — ACETAMINOPHEN 500 MG PO TABS
500.0000 mg | ORAL_TABLET | Freq: Four times a day (QID) | ORAL | Status: AC
  Administered 2018-10-13 – 2018-10-14 (×3): 500 mg via ORAL
  Filled 2018-10-13 (×4): qty 1

## 2018-10-13 MED ORDER — CLOPIDOGREL BISULFATE 75 MG PO TABS
75.0000 mg | ORAL_TABLET | Freq: Every day | ORAL | Status: DC
  Administered 2018-10-14 – 2018-10-18 (×5): 75 mg via ORAL
  Filled 2018-10-13 (×5): qty 1

## 2018-10-13 MED ORDER — FENTANYL CITRATE (PF) 250 MCG/5ML IJ SOLN
INTRAMUSCULAR | Status: AC
  Filled 2018-10-13: qty 5

## 2018-10-13 MED ORDER — DEXAMETHASONE SODIUM PHOSPHATE 10 MG/ML IJ SOLN
INTRAMUSCULAR | Status: AC
  Filled 2018-10-13: qty 1

## 2018-10-13 MED ORDER — PHENYLEPHRINE 40 MCG/ML (10ML) SYRINGE FOR IV PUSH (FOR BLOOD PRESSURE SUPPORT)
PREFILLED_SYRINGE | INTRAVENOUS | Status: AC
  Filled 2018-10-13: qty 10

## 2018-10-13 MED ORDER — DOCUSATE SODIUM 100 MG PO CAPS
100.0000 mg | ORAL_CAPSULE | Freq: Two times a day (BID) | ORAL | Status: DC
  Administered 2018-10-13 – 2018-10-17 (×9): 100 mg via ORAL
  Filled 2018-10-13 (×10): qty 1

## 2018-10-13 MED ORDER — LEVOTHYROXINE SODIUM 25 MCG PO TABS
25.0000 ug | ORAL_TABLET | Freq: Every day | ORAL | Status: DC
  Administered 2018-10-13 – 2018-10-18 (×6): 25 ug via ORAL
  Filled 2018-10-13 (×6): qty 1

## 2018-10-13 MED ORDER — ASPIRIN EC 325 MG PO TBEC
325.0000 mg | DELAYED_RELEASE_TABLET | Freq: Every day | ORAL | Status: DC
  Administered 2018-10-13 – 2018-10-18 (×6): 325 mg via ORAL
  Filled 2018-10-13 (×6): qty 1

## 2018-10-13 MED ORDER — BUPROPION HCL ER (XL) 150 MG PO TB24
150.0000 mg | ORAL_TABLET | Freq: Every day | ORAL | Status: DC
  Administered 2018-10-13 – 2018-10-18 (×6): 150 mg via ORAL
  Filled 2018-10-13 (×6): qty 1

## 2018-10-13 MED ORDER — INSULIN ASPART 100 UNIT/ML ~~LOC~~ SOLN
0.0000 [IU] | Freq: Three times a day (TID) | SUBCUTANEOUS | Status: DC
  Administered 2018-10-13 – 2018-10-14 (×3): 2 [IU] via SUBCUTANEOUS
  Administered 2018-10-14 – 2018-10-16 (×2): 3 [IU] via SUBCUTANEOUS
  Administered 2018-10-17: 1 [IU] via SUBCUTANEOUS

## 2018-10-13 MED ORDER — PHENOL 1.4 % MT LIQD: 1.0000 | OROMUCOSAL | Status: DC | PRN

## 2018-10-13 MED ORDER — ONDANSETRON HCL 4 MG/2ML IJ SOLN
INTRAMUSCULAR | Status: AC
  Filled 2018-10-13: qty 2

## 2018-10-13 MED ORDER — ACETAMINOPHEN 325 MG PO TABS
325.0000 mg | ORAL_TABLET | Freq: Four times a day (QID) | ORAL | Status: DC | PRN
  Filled 2018-10-13: qty 2

## 2018-10-13 MED ORDER — VENLAFAXINE HCL ER 75 MG PO CP24
75.0000 mg | ORAL_CAPSULE | Freq: Every day | ORAL | Status: DC
  Administered 2018-10-13 – 2018-10-18 (×6): 75 mg via ORAL
  Filled 2018-10-13 (×8): qty 1

## 2018-10-13 MED ORDER — TRANEXAMIC ACID-NACL 1000-0.7 MG/100ML-% IV SOLN
INTRAVENOUS | Status: AC
  Filled 2018-10-13: qty 100

## 2018-10-13 MED ORDER — ISOSORBIDE MONONITRATE ER 30 MG PO TB24
30.0000 mg | ORAL_TABLET | Freq: Every day | ORAL | Status: DC
  Administered 2018-10-13 – 2018-10-18 (×6): 30 mg via ORAL
  Filled 2018-10-13 (×6): qty 1

## 2018-10-13 MED ORDER — MENTHOL 3 MG MT LOZG: 1.0000 | LOZENGE | OROMUCOSAL | Status: DC | PRN

## 2018-10-13 MED ORDER — DIPHENOXYLATE-ATROPINE 2.5-0.025 MG PO TABS: 1.0000 | ORAL_TABLET | Freq: Four times a day (QID) | ORAL | Status: DC | PRN

## 2018-10-13 MED ORDER — PHENYLEPHRINE 40 MCG/ML (10ML) SYRINGE FOR IV PUSH (FOR BLOOD PRESSURE SUPPORT)
PREFILLED_SYRINGE | INTRAVENOUS | Status: DC | PRN
  Administered 2018-10-13 (×2): 120 ug via INTRAVENOUS

## 2018-10-13 MED ORDER — LIDOCAINE 2% (20 MG/ML) 5 ML SYRINGE
INTRAMUSCULAR | Status: DC | PRN
  Administered 2018-10-13: 60 mg via INTRAVENOUS

## 2018-10-13 MED ORDER — HYDROCODONE-ACETAMINOPHEN 7.5-325 MG PO TABS
1.0000 | ORAL_TABLET | ORAL | Status: DC | PRN
  Administered 2018-10-14 – 2018-10-18 (×3): 2 via ORAL
  Filled 2018-10-13 (×3): qty 2
  Filled 2018-10-13: qty 1
  Filled 2018-10-13: qty 2

## 2018-10-13 MED ORDER — MORPHINE SULFATE (PF) 2 MG/ML IV SOLN
1.0000 mg | INTRAVENOUS | Status: DC | PRN
  Administered 2018-10-14 – 2018-10-17 (×6): 1 mg via INTRAVENOUS
  Filled 2018-10-13 (×7): qty 1

## 2018-10-13 MED ORDER — PROPOFOL 10 MG/ML IV BOLUS
INTRAVENOUS | Status: AC
  Filled 2018-10-13: qty 20

## 2018-10-13 MED ORDER — ONDANSETRON HCL 4 MG/2ML IJ SOLN: 4.0000 mg | Freq: Four times a day (QID) | INTRAMUSCULAR | Status: DC | PRN

## 2018-10-13 MED ORDER — DOCUSATE SODIUM 100 MG PO CAPS: 100.0000 mg | ORAL_CAPSULE | Freq: Two times a day (BID) | ORAL | Status: DC

## 2018-10-13 MED ORDER — DEXTROSE-NACL 5-0.45 % IV SOLN
INTRAVENOUS | Status: AC
  Administered 2018-10-13: 11:00:00 via INTRAVENOUS

## 2018-10-13 MED ORDER — ASPIRIN EC 81 MG PO TBEC: 325.0000 mg | DELAYED_RELEASE_TABLET | Freq: Every day | ORAL | 0 refills | Status: DC

## 2018-10-13 MED ORDER — SODIUM CHLORIDE 0.9 % IV SOLN
INTRAVENOUS | Status: DC | PRN
  Administered 2018-10-13: 20 ug/min via INTRAVENOUS

## 2018-10-13 MED ORDER — HYDROCODONE-ACETAMINOPHEN 5-325 MG PO TABS
1.0000 | ORAL_TABLET | ORAL | Status: DC | PRN
  Administered 2018-10-14 – 2018-10-18 (×11): 2 via ORAL
  Filled 2018-10-13 (×11): qty 2

## 2018-10-13 MED ORDER — SORBITOL 70 % SOLN: 30.0000 mL | Freq: Every day | Status: DC | PRN

## 2018-10-13 MED ORDER — ALUM & MAG HYDROXIDE-SIMETH 200-200-20 MG/5ML PO SUSP: 30.0000 mL | ORAL | Status: DC | PRN

## 2018-10-13 SURGICAL SUPPLY — 45 items
BIT DRILL SHORT 4.0 (BIT) IMPLANT
BNDG COHESIVE 4X5 TAN NS LF (GAUZE/BANDAGES/DRESSINGS) ×3 IMPLANT
BNDG COHESIVE 6X5 TAN STRL LF (GAUZE/BANDAGES/DRESSINGS) IMPLANT
BNDG GAUZE ELAST 4 BULKY (GAUZE/BANDAGES/DRESSINGS) ×3 IMPLANT
COVER PERINEAL POST (MISCELLANEOUS) ×3 IMPLANT
COVER SURGICAL LIGHT HANDLE (MISCELLANEOUS) ×3 IMPLANT
COVER WAND RF STERILE (DRAPES) ×3 IMPLANT
DRAPE STERI IOBAN 125X83 (DRAPES) ×3 IMPLANT
DRILL BIT SHORT 4.0 (BIT) ×6
DRSG MEPILEX BORDER 4X4 (GAUZE/BANDAGES/DRESSINGS) ×7 IMPLANT
DRSG MEPILEX BORDER 4X8 (GAUZE/BANDAGES/DRESSINGS) ×3 IMPLANT
DRSG PAD ABDOMINAL 8X10 ST (GAUZE/BANDAGES/DRESSINGS) ×6 IMPLANT
DURAPREP 26ML APPLICATOR (WOUND CARE) ×3 IMPLANT
ELECT REM PT RETURN 9FT ADLT (ELECTROSURGICAL) ×3
ELECTRODE REM PT RTRN 9FT ADLT (ELECTROSURGICAL) ×1 IMPLANT
GLOVE BIOGEL PI IND STRL 7.0 (GLOVE) ×1 IMPLANT
GLOVE BIOGEL PI INDICATOR 7.0 (GLOVE) ×2
GLOVE ECLIPSE 7.0 STRL STRAW (GLOVE) ×3 IMPLANT
GLOVE SKINSENSE NS SZ7.5 (GLOVE) ×4
GLOVE SKINSENSE STRL SZ7.5 (GLOVE) ×2 IMPLANT
GOWN STRL REIN XL XLG (GOWN DISPOSABLE) ×3 IMPLANT
GUIDE PIN 3.2X343 (PIN) ×2
GUIDE PIN 3.2X343MM (PIN) ×6
GUIDE ROD 3.0 (MISCELLANEOUS) ×3
KIT BASIN OR (CUSTOM PROCEDURE TRAY) ×3 IMPLANT
KIT TURNOVER KIT B (KITS) ×3 IMPLANT
MANIFOLD NEPTUNE II (INSTRUMENTS) ×3 IMPLANT
NAIL TRIGEN META TAN 9X32 LT (Nail) ×2 IMPLANT
NS IRRIG 1000ML POUR BTL (IV SOLUTION) ×3 IMPLANT
PACK GENERAL/GYN (CUSTOM PROCEDURE TRAY) ×3 IMPLANT
PAD ARMBOARD 7.5X6 YLW CONV (MISCELLANEOUS) ×6 IMPLANT
PAD CAST 4YDX4 CTTN HI CHSV (CAST SUPPLIES) ×2 IMPLANT
PADDING CAST COTTON 4X4 STRL (CAST SUPPLIES) ×6
PIN GUIDE 3.2X343MM (PIN) IMPLANT
ROD GUIDE 3.0 (MISCELLANEOUS) IMPLANT
SCREW COMP LAG 85MM META TAN (Screw) ×2 IMPLANT
SCREW TRIGEN LOW PROF 5.0X32.5 (Screw) ×2 IMPLANT
STAPLER VISISTAT 35W (STAPLE) ×3 IMPLANT
SUT VIC AB 0 CT1 27 (SUTURE) ×3
SUT VIC AB 0 CT1 27XBRD ANBCTR (SUTURE) ×1 IMPLANT
SUT VIC AB 2-0 CT1 27 (SUTURE) ×3
SUT VIC AB 2-0 CT1 TAPERPNT 27 (SUTURE) ×1 IMPLANT
TOWEL GREEN STERILE (TOWEL DISPOSABLE) ×3 IMPLANT
TOWEL GREEN STERILE FF (TOWEL DISPOSABLE) ×3 IMPLANT
WATER STERILE IRR 1000ML POUR (IV SOLUTION) ×3 IMPLANT

## 2018-10-13 NOTE — Progress Notes (Signed)
Pharmacy note:  Amoxicillin and Biaxin begun 10/03/18 and planned for 14 days. Last PTA dose 9/9 am.  Stop date added for 10/17/18 after am dose.  Arty Baumgartner, [place] Pager: [phone] or phone: [phone] 10/13/2018 11:31 AM

## 2018-10-13 NOTE — Anesthesia Procedure Notes (Signed)
Procedure Name: Intubation Date/Time: 10/13/2018 2:48 PM Performed by: Silas Sacramento, CRNA Pre-anesthesia Checklist: Patient identified, Emergency Drugs available, Suction available and Patient being monitored Patient Re-evaluated:Patient Re-evaluated prior to induction Oxygen Delivery Method: Circle system utilized Preoxygenation: Pre-oxygenation with 100% oxygen Induction Type: IV induction and Rapid sequence Laryngoscope Size: Mac and 3 Grade View: Grade I Tube type: Oral Tube size: 7.5 mm Number of attempts: 1 Airway Equipment and Method: Stylet and Oral airway Placement Confirmation: ETT inserted through vocal cords under direct vision,  positive ETCO2 and breath sounds checked- equal and bilateral Secured at: 21 cm Tube secured with: Tape Dental Injury: Teeth and Oropharynx as per pre-operative assessment

## 2018-10-13 NOTE — Anesthesia Postprocedure Evaluation (Signed)
Anesthesia Post Note  Patient: [name]  Procedure(s) Performed: INTRAMEDULLARY (IM) NAIL INTERTROCHANTRIC (Left Hip)     Patient location during evaluation: PACU Anesthesia Type: General Level of consciousness: awake and alert and oriented Pain management: pain level controlled Vital Signs Assessment: post-procedure vital signs reviewed and stable Respiratory status: spontaneous breathing, nonlabored ventilation and respiratory function stable Cardiovascular status: blood pressure returned to baseline and stable Postop Assessment: no apparent nausea or vomiting Anesthetic complications: no    Last Vitals:  Vitals:   10/13/18 1715 10/13/18 1729  BP: 128/78 130/84  Pulse: 90 89  Resp: (!) 9 12  Temp: 36.8 C 36.9 C  SpO2: 96% 94%    Last Pain:  Vitals:   10/13/18 1729  TempSrc: Oral  PainSc:                  Airica Schwartzkopf A.

## 2018-10-13 NOTE — Op Note (Signed)
° °  Date of Surgery: 10/13/2018  INDICATIONS: [name] is a 48 y.o.-year-old female who sustained a left hip fracture. The risks and benefits of the procedure discussed with the patient prior to the procedure and all questions were answered; consent was obtained.  PREOPERATIVE DIAGNOSIS: left intertrochanteric hip fracture   POSTOPERATIVE DIAGNOSIS: Same   PROCEDURE: Open treatment of intertrochanteric fracture with intramedullary implant. CPT [phone]   SURGEON: N. Eduard Roux, M.D.   ASSIST: Ciro Backer [place], [place]; necessary for the timely completion of procedure and due to complexity of procedure.  ANESTHESIA: general   IV FLUIDS AND URINE: See anesthesia record   ESTIMATED BLOOD LOSS: 150 cc  IMPLANTS: [employer] 9 x 32, 85/80  DRAINS: None.   COMPLICATIONS: see description of procedure.   DESCRIPTION OF PROCEDURE: The patient was brought to the operating room and placed supine on the operating table. The patient's leg had been signed prior to the procedure. The patient had the anesthesia placed by the anesthesiologist. The prep verification and incision time-outs were performed to confirm that this was the correct patient, site, side and location. The patient had an SCD on the opposite lower extremity. The patient did receive antibiotics prior to the incision and was re-dosed during the procedure as needed at indicated intervals. The patient was positioned on the fracture table with the table in traction and internal rotation to reduce the hip. The well leg was placed in a scissor position and all bony prominences were well-padded. The patient had the lower extremity prepped and draped in the standard surgical fashion. The incision was made 4 finger breadths superior to the greater trochanter. A guide pin was inserted into the tip of the greater trochanter under fluoroscopic guidance. An opening reamer was used to gain access to the femoral canal. The nail length  was measured and inserted down the femoral canal to its proper depth. The appropriate version of insertion for the lag screw was found under fluoroscopy. A pin was inserted up the femoral neck through the jig. Then, a second antirotation pin was inserted inferior to the first pin. The length of the lag screw was then measured. The lag screw was inserted as near to center-center in the head as possible. The antirotation pin was then taken out and an interdigitating compression screw was placed in its place. The leg was taken out of traction, then the interdigitating compression screw was used to compress across the fracture. Compression was visualized on serial xrays. A distal interlocking screw was placed using perfect circle technique.  The wound was copiously irrigated with saline and the subcutaneous layer closed with 2.0 vicryl and the skin was reapproximated with staples. The wounds were cleaned and dried a final time and a sterile dressing was placed. The hip was taken through a range of motion at the end of the case under fluoroscopic imaging to visualize the approach-withdraw phenomenon and confirm implant length in the head. The patient was then awakened from anesthesia and taken to the recovery room in stable condition. All counts were correct at the end of the case.   POSTOPERATIVE PLAN: The patient will be weight bearing as tolerated and will return in 2 weeks for staple removal and the patient will receive DVT prophylaxis based on other medications, activity level, and risk ratio of bleeding to thrombosis.   Azucena Cecil, MD [facility] [phone] 3:31 PM

## 2018-10-13 NOTE — Discharge Instructions (Signed)
° ° °  1. Change dressings as needed °2. May shower but keep incisions covered and dry °3. Take aspirin and plavix to prevent blood clots °4. Take stool softeners as needed °5. Take pain meds as needed ° °

## 2018-10-13 NOTE — Progress Notes (Signed)
Shanon Brow, RN stated pt said herself that she would like to be a " FULL CODE" instead of a DNR . When we entered for bedside report we both asked the pt and she stated " "she wants to be a full code because she has a 48yr old kid". RN will notify MD

## 2018-10-13 NOTE — Progress Notes (Signed)
[facility]                                                                              Patient Demographics  [address] [name], is a 48 y.o. female, DOB - 1970/04/23, ZOX:[id number]  Admit date - 10/12/2018   Admitting Physician Anselm Jungling, DO  Outpatient Primary MD for the patient is Fisher, Demetrios Isaacs, MD  Outpatient specialists:   LOS - 1  days   Medical records reviewed and are as summarized below:    Chief Complaint  Patient presents with   Fall       Brief summary   Patient is a 48 year old female with history of CVA, MS, chronic systolic CHF, hypertension, diabetes type 2, stage III CKD, hyperlipidemia, depression presented to ED after mechanical fall.  Patient reports she has baseline unsteady gait, has frequent falls.  On the day of admission, she was climbing up one step of stairs, lost her balance and fell on her left side and also hit her head. CT head, C-spine negative. Left femur x-ray showed minimally displaced intertrochanteric fracture of the proximal left femur.   Assessment & Plan    Principal Problem:   Closed nondisplaced intertrochanteric fracture of left femur (HCC) -Continue n.p.o. status, discussed with Dr Roda Shutters, plan for OR today -Patient reports she is diabetic, feeling somewhat hypoglycemic, placed on D5 half-normal until after surgery and tolerating p.o. -Pain control, DVT prophylaxis per orthopedics -Added stool softeners  Active Problems: History of CVA -Holding Plavix, restart after surgery    Depression with anxiety -Continue Wellbutrin, Effexor, Xanax prn  Diabetes mellitus type 2, NIDDM, with neuropathy -Placed on sliding scale insulin sensitive, follow hemoglobin A1c  Hyperlipidemia -Continue Crestor  Hypothyroidism -Continue levothyroxine  Iron deficiency anemia, chronic gastritis -H&H stable, continue to monitor CBC Continue PPI  Hypertension Continue metoprolol, Imdur, losartan  Recent diagnosis of H.  pylori gastritis -Patient has been following with her PCP closely, had chronic epigastric pain, nausea and vomiting.  Patient was found to have H. pylori gastritis -Ultrasound abdomen was unremarkable. -Patient was started on amoxicillin 1000 mg twice daily and Biaxin 500 mg twice a day for 14 days.  to end on 9/12.  Requested pharmacy to verify the stop date. -Continue PPI Patient was seen by her PCP on 9/9, was recommended to finish the regimen and repeat H. pylori breath test after she has completed the therapy.  History of IBS -Placed on stool softeners, follow closely  Acute on CKD stage III -Baseline creatinine appears to be 1.5-1.7 -Presented with creatinine of 1.8. -Placed on gentle IV fluid hydration  Code Status: *Full code DVT Prophylaxis: Per orthopedics Family Communication: Discussed all imaging results, lab results, explained to the patient   Disposition Plan: Pending surgery today  Time Spent in minutes 35 minutes  Procedures:  None  Consultants:   Orthopedics  Antimicrobials:   Anti-infectives (From admission, onward)   Start     Dose/Rate Route Frequency Ordered Stop   10/13/18 1200  ceFAZolin (ANCEF) IVPB 2g/100 mL premix     2 g 200 mL/hr over 30 Minutes Intravenous To Short Stay 10/12/18 2324  10/14/18 1200          Medications  Scheduled Meds:  buPROPion  150 mg Oral Daily   docusate sodium  100 mg Oral BID   gabapentin  300 mg Oral BID   insulin aspart  0-5 Units Subcutaneous QHS   insulin aspart  0-9 Units Subcutaneous TID WC   isosorbide mononitrate  30 mg Oral Daily   levothyroxine  25 mcg Oral QAC breakfast   losartan  25 mg Oral Daily   metoprolol succinate  12.5 mg Oral Daily   pantoprazole  40 mg Oral BID   rosuvastatin  40 mg Oral Daily   venlafaxine XR  75 mg Oral Q breakfast   Continuous Infusions:   ceFAZolin (ANCEF) IV     dextrose 5 % and 0.45% NaCl 75 mL/hr at 10/13/18 1033   tranexamic acid     PRN  Meds:.ALPRAZolam, diphenoxylate-atropine, HYDROcodone-acetaminophen, morphine injection, polyethylene glycol      Subjective:   [address] [name] was seen and examined today.  States pain is not well controlled, 6/10, requesting pain medications.  No nausea or vomiting, fevers or chills.  Complaining of mild epigastric abdominal pain, states undergoing treatment for H. pylori gastritis by her PCP.  Patient denies dizziness, chest pain, shortness of breath,  new weakness, numbess, tingling. No acute events overnight.    Objective:   Vitals:   10/12/18 2319 10/13/18 0201 10/13/18 0456 10/13/18 0823  BP: (!) 196/86 (!) 198/97 (!) 141/85 (!) 159/87  Pulse: 72 72 70 75  Resp:   18 18  Temp: 97.9 F (36.6 C)  98.1 F (36.7 C) 98.6 F (37 C)  TempSrc: Oral  Oral Oral  SpO2: 98%  100% 100%    Intake/Output Summary (Last 24 hours) at 10/13/2018 1103 Last data filed at 10/13/2018 0900 Gross per 24 hour  Intake --  Output 900 ml  Net -900 ml     Wt Readings from Last 3 Encounters:  10/12/18 69.5 kg  08/31/18 72.1 kg  08/16/18 72.6 kg     Exam  General: Alert and oriented x 3, NAD  Eyes:   HEENT:  Atraumatic, normocephalic, normal oropharynx  Cardiovascular: S1 S2 auscultated, no murmurs, RRR  Respiratory: Clear to auscultation bilaterally, no wheezing, rales or rhonchi  Gastrointestinal: Soft, epigastric TTP , nondistended, + bowel sounds  Ext: no pedal edema bilaterally  Neuro: Unable to lift L LE due to pain  Musculoskeletal: No digital cyanosis, clubbing  Skin: No rashes  Psych: Normal affect and demeanor, alert and oriented x3    Data Reviewed:  I have personally reviewed following labs and imaging studies  Micro Results Recent Results (from the past 240 hour(s))  SARS Coronavirus 2 [facility] order, Performed in [facility] hospital lab) Nasopharyngeal Nasopharyngeal Swab     Status: None   Collection Time: 10/12/18  6:00 PM   Specimen: Nasopharyngeal Swab   Result Value Ref Range Status   SARS Coronavirus 2 NEGATIVE NEGATIVE Final    Comment: (NOTE) If result is NEGATIVE SARS-CoV-2 target nucleic acids are NOT DETECTED. The SARS-CoV-2 RNA is generally detectable in upper and lower  respiratory specimens during the acute phase of infection. The lowest  concentration of SARS-CoV-2 viral copies this assay can detect is 250  copies / mL. A negative result does not preclude SARS-CoV-2 infection  and should not be used as the sole basis for treatment or other  patient management decisions.  A negative result may occur with  improper  specimen collection / handling, submission of specimen other  than nasopharyngeal swab, presence of viral mutation(s) within the  areas targeted by this assay, and inadequate number of viral copies  (<250 copies / mL). A negative result must be combined with clinical  observations, patient history, and epidemiological information. If result is POSITIVE SARS-CoV-2 target nucleic acids are DETECTED. The SARS-CoV-2 RNA is generally detectable in upper and lower  respiratory specimens dur ing the acute phase of infection.  Positive  results are indicative of active infection with SARS-CoV-2.  Clinical  correlation with patient history and other diagnostic information is  necessary to determine patient infection status.  Positive results do  not rule out bacterial infection or co-infection with other viruses. If result is PRESUMPTIVE POSTIVE SARS-CoV-2 nucleic acids MAY BE PRESENT.   A presumptive positive result was obtained on the submitted specimen  and confirmed on repeat testing.  While 2019 novel coronavirus  (SARS-CoV-2) nucleic acids may be present in the submitted sample  additional confirmatory testing may be necessary for epidemiological  and / or clinical management purposes  to differentiate between  SARS-CoV-2 and other Sarbecovirus currently known to infect humans.  If clinically indicated additional  testing with an alternate test  methodology [phone]) is advised. The SARS-CoV-2 RNA is generally  detectable in upper and lower respiratory sp ecimens during the acute  phase of infection. The expected result is Negative. Fact Sheet for Patients:  BoilerBrush.com.cy Fact Sheet for Healthcare Providers: https://pope.com/ This test is not yet approved or cleared by the [place] FDA and has been authorized for detection and/or diagnosis of SARS-CoV-2 by FDA under an Emergency Use Authorization (EUA).  This EUA will remain in effect (meaning this test can be used) for the duration of the COVID-19 declaration under Section 564(b)(1) of the Act, 21 U.S.C. section 360bbb-3(b)(1), unless the authorization is terminated or revoked sooner. Performed at [facility], [address] [place], [place] [address]   Surgical pcr screen     Status: None   Collection Time: 10/13/18 12:08 AM   Specimen: Nasal Mucosa; Nasal Swab  Result Value Ref Range Status   MRSA, PCR NEGATIVE NEGATIVE Final   Staphylococcus aureus NEGATIVE NEGATIVE Final    Comment: (NOTE) The Xpert SA Assay (FDA approved for NASAL specimens in patients 23 years of age and older), is one component of a comprehensive surveillance program. It is not intended to diagnose infection nor to guide or monitor treatment. Performed at [facility], [address], [place], [place] [address]     Radiology Reports Dg Chest 1 View  Result Date: 10/12/2018 CLINICAL DATA:  Fall. EXAM: CHEST  1 VIEW COMPARISON:  Radiograph Jun 25, 2018. FINDINGS: Stable cardiomediastinal silhouette. No pneumothorax or pleural effusion is noted. Both lungs are clear. The visualized skeletal structures are unremarkable. IMPRESSION: No active disease. Electronically Signed   By: Lupita Raider M.D.   On: 10/12/2018 17:01   Dg Pelvis 1-2 Views  Result Date: 10/12/2018 CLINICAL DATA:  Fall. EXAM:  PELVIS - 1-2 VIEW COMPARISON:  None. FINDINGS: Minimally displaced fracture is seen involving the intertrochanteric region of the proximal left femur. Right hip is unremarkable. Pelvis appears normal. IMPRESSION: Minimally displaced intertrochanteric fracture of the proximal left femur. Electronically Signed   By: Lupita Raider M.D.   On: 10/12/2018 16:58   Ct Head Wo Contrast  Result Date: 10/12/2018 CLINICAL DATA:  Status post fall backwards.  Hit her head on a car.  EXAM: CT HEAD WITHOUT CONTRAST CT CERVICAL SPINE WITHOUT CONTRAST TECHNIQUE: Multidetector CT imaging of the head and cervical spine was performed following the standard protocol without intravenous contrast. Multiplanar CT image reconstructions of the cervical spine were also generated. COMPARISON:  06/05/2012 FINDINGS: CT HEAD FINDINGS Brain: No evidence of acute infarction, hemorrhage, hydrocephalus, extra-axial collection or mass lesion/mass effect. Old right basal ganglia lacunar infarct. Periventricular white matter low attenuation as can be seen with microvascular disease. Mild generalized cerebral atrophy. Vascular: No hyperdense vessel. Intracranial atherosclerotic disease. Skull: No osseous abnormality. Sinuses/Orbits: Visualized paranasal sinuses are clear. Visualized mastoid sinuses are clear. Visualized orbits demonstrate no focal abnormality. Other: None CT CERVICAL SPINE FINDINGS Alignment: Normal. Skull base and vertebrae: No acute fracture. No primary bone lesion or focal pathologic process. Soft tissues and spinal canal: No prevertebral fluid or swelling. No visible canal hematoma. Disc levels:  Disc spaces are maintained.  No foraminal stenosis. Upper chest: Lung apices are clear. Right-sided aortic arch with ligation of the left subclavian artery. Other: No fluid collection or hematoma. IMPRESSION: 1. No acute intracranial pathology. 2.  No acute osseous injury of the cervical spine. Electronically Signed   By: Elige Ko    On: 10/12/2018 17:17   Ct Cervical Spine Wo Contrast  Result Date: 10/12/2018 CLINICAL DATA:  Status post fall backwards.  Hit her head on a car. EXAM: CT HEAD WITHOUT CONTRAST CT CERVICAL SPINE WITHOUT CONTRAST TECHNIQUE: Multidetector CT imaging of the head and cervical spine was performed following the standard protocol without intravenous contrast. Multiplanar CT image reconstructions of the cervical spine were also generated. COMPARISON:  06/05/2012 FINDINGS: CT HEAD FINDINGS Brain: No evidence of acute infarction, hemorrhage, hydrocephalus, extra-axial collection or mass lesion/mass effect. Old right basal ganglia lacunar infarct. Periventricular white matter low attenuation as can be seen with microvascular disease. Mild generalized cerebral atrophy. Vascular: No hyperdense vessel. Intracranial atherosclerotic disease. Skull: No osseous abnormality. Sinuses/Orbits: Visualized paranasal sinuses are clear. Visualized mastoid sinuses are clear. Visualized orbits demonstrate no focal abnormality. Other: None CT CERVICAL SPINE FINDINGS Alignment: Normal. Skull base and vertebrae: No acute fracture. No primary bone lesion or focal pathologic process. Soft tissues and spinal canal: No prevertebral fluid or swelling. No visible canal hematoma. Disc levels:  Disc spaces are maintained.  No foraminal stenosis. Upper chest: Lung apices are clear. Right-sided aortic arch with ligation of the left subclavian artery. Other: No fluid collection or hematoma. IMPRESSION: 1. No acute intracranial pathology. 2.  No acute osseous injury of the cervical spine. Electronically Signed   By: Elige Ko   On: 10/12/2018 17:17   US Abdomen Complete  Result Date: 10/05/2018 CLINICAL DATA:  Nausea and vomiting EXAM: ABDOMEN ULTRASOUND COMPLETE COMPARISON:  03/19/2017 renal ultrasound, CT 01/27/2017 FINDINGS: Gallbladder: Surgically absent Common bile duct: Diameter: 3.4 mm Liver: No focal lesion identified. Within normal limits  in parenchymal echogenicity. Portal vein is patent on color Doppler imaging with normal direction of blood flow towards the liver. IVC: No abnormality visualized. Pancreas: Not well seen due to bowel gas Spleen: Size and appearance within normal limits. Right Kidney: Length: 8 cm. Echogenicity within normal limits. No mass or hydronephrosis visualized. Left Kidney: Length: 10.2 cm. Echogenicity within normal limits. No mass or hydronephrosis visualized. Abdominal aorta: No aneurysm visualized.  Maximum diameter 2.5 cm Other findings: None. IMPRESSION: 1. Status post cholecystectomy without biliary dilatation 2. Slight asymmetric size of kidneys right smaller than left but kidneys otherwise appear morphologically normal. Electronically Signed   By:  Jasmine Pang M.D.   On: 10/05/2018 13:39   Dg Shoulder Left  Result Date: 10/12/2018 CLINICAL DATA:  Status post fall backwards. EXAM: LEFT SHOULDER - 2+ VIEW COMPARISON:  None. FINDINGS: There is no fracture or dislocation. There is minimal osteoarthritis of the left glenohumeral joint. There are mild degenerative changes of the acromioclavicular joint. IMPRESSION: No acute osseous injury of the left shoulder. Electronically Signed   By: Elige Ko   On: 10/12/2018 16:59   Dg Femur Min 2 Views Left  Result Date: 10/12/2018 CLINICAL DATA:  Fall. EXAM: LEFT FEMUR 2 VIEWS COMPARISON:  None. FINDINGS: Minimally displaced fracture is seen involving intertrochanteric region of proximal left femur. Vascular calcifications are noted. Remaining portion of femur is unremarkable. IMPRESSION: Minimally displaced intertrochanteric fracture of proximal left femur. Electronically Signed   By: Lupita Raider M.D.   On: 10/12/2018 16:59    Lab Data:  CBC: Recent Labs  Lab 10/12/18 0944 10/12/18 1522 10/13/18 1019  WBC 7.0 10.9* 9.4  HGB 10.4* 9.7* 10.6*  HCT 33.4* 32.4* 35.4*  MCV 73* 74.1* 73.3*  PLT 216 190 206   Basic Metabolic Panel: Recent Labs  Lab  10/12/18 0944 10/12/18 1522  NA 141 140  K 4.3 4.2  CL 104 108  CO2 23 25  GLUCOSE 128* 69*  BUN 17 16  CREATININE 1.78* 1.81*  CALCIUM 8.9 8.5*   GFR: Estimated Creatinine Clearance: 35.1 mL/min (A) (by C-G formula based on SCr of 1.81 mg/dL (H)). Liver Function Tests: Recent Labs  Lab 10/12/18 0944  AST 28  ALT 32  ALKPHOS 156*  BILITOT 0.2  PROT 5.9*  ALBUMIN 3.5*   No results for input(s): LIPASE, AMYLASE in the last 168 hours. No results for input(s): AMMONIA in the last 168 hours. Coagulation Profile: No results for input(s): INR, PROTIME in the last 168 hours. Cardiac Enzymes: No results for input(s): CKTOTAL, CKMB, CKMBINDEX, TROPONINI in the last 168 hours. BNP (last 3 results) No results for input(s): PROBNP in the last 8760 hours. HbA1C: Recent Labs    10/12/18 0944  HGBA1C 5.3   CBG: Recent Labs  Lab 10/12/18 1932  GLUCAP 128*   Lipid Profile: No results for input(s): CHOL, HDL, LDLCALC, TRIG, CHOLHDL, LDLDIRECT in the last 72 hours. Thyroid Function Tests: No results for input(s): TSH, T4TOTAL, FREET4, T3FREE, THYROIDAB in the last 72 hours. Anemia Panel: Recent Labs    10/12/18 0944  FERRITIN 19  TIBC 228*  IRON 37   Urine analysis:    Component Value Date/Time   COLORURINE STRAW (A) 06/25/2018 2131   APPEARANCEUR HAZY (A) 06/25/2018 2131   APPEARANCEUR Cloudy (A) 07/17/2014 1455   LABSPEC 1.003 (L) 06/25/2018 2131   LABSPEC 1.012 12/29/2013 1210   PHURINE 6.0 06/25/2018 2131   GLUCOSEU NEGATIVE 06/25/2018 2131   GLUCOSEU >=500 12/29/2013 1210   HGBUR NEGATIVE 06/25/2018 2131   HGBUR negative 04/14/2010 1435   BILIRUBINUR Negative 07/04/2018 1605   BILIRUBINUR Negative 07/17/2014 1455   BILIRUBINUR Negative 12/29/2013 1210   KETONESUR NEGATIVE 06/25/2018 2131   PROTEINUR Negative 07/04/2018 1605   PROTEINUR NEGATIVE 06/25/2018 2131   UROBILINOGEN 0.2 07/04/2018 1605   UROBILINOGEN negative 04/14/2010 1435   NITRITE Negative  07/04/2018 1605   NITRITE NEGATIVE 06/25/2018 2131   LEUKOCYTESUR Moderate (2+) (A) 07/04/2018 1605   LEUKOCYTESUR NEGATIVE 06/25/2018 2131   LEUKOCYTESUR Negative 12/29/2013 1210     Shadasia Oldfield M.D. [facility] 10/13/2018, 11:03 AM  Pager: [phone] Between 7am  to 7pm - call Pager - [phone]  After 7pm go to [email or web address] - password [email or web address]  Call night coverage person covering after 7pm

## 2018-10-13 NOTE — H&P (Signed)
History and Physical    [name] WGN:[id number] DOB: 1970-10-16 DOA: 10/12/2018  PCP: [name], MD  Patient coming from: home and lives with son and his friend.  I have personally briefly reviewed patient's old medical records in [place]  Chief Complaint: fall  HPI: [name] is a 48 y.o. female with medical history significant of history of CVA, multiple sclerosis, chronic systolic CHF hypertension, type 2 diabetes, stage III chronic kidney disease hyperlipidemia, depression who presented to the emergency department status post a mechanical fall.  Patient endorsed that she normally has an unsteady gait and has frequent falls.  Today while climbing up one step of stairs she lost her balance and fell on her left side and also hit her head.  Denies any dizziness, lightheadedness or any loss of consciousness.  Denies any chest pain, shortness of breath, abdominal pain.    ED Course: She was afebrile and hypertensive at times up to 190s on room air.  CBC showed no leukocytosis or anemia.  BMP otherwise unremarkable.  Negative CT cervical spine.Negative CT head.Left femur x-ray showed a minimally displaced intertrochanteric fracture of the proximal left femur.  Review of Systems:  Constitutional: No Weight Change, No Fever ENT/Mouth: No sore throat, No Rhinorrhea Eyes: No Eye Pain,  No Vision Changes Cardiovascular: noChest Pain, no SOB,  No Palpitations Respiratory: No Cough,  No Dyspnea Gastrointestinal: No Nausea, No Vomiting, No Diarrhea, No Constipation, No Pain Genitourinary: no Urinary Incontinence, No Urgency, No Flank Pain Musculoskeletal: No Arthralgias, No Myalgias Skin: No Skin Lesions, No Pruritus, Neuro: + Weakness, No Numbness,  No Loss of Consciousness, No Syncope Psych: No Anxiety/Panic, No Depression, decrease appetite Heme/Lymph: No Bruising, No Bleeding  Past Medical History:  Diagnosis Date   Acute pyelonephritis    Anemia    iron deficiency anemia   Aortic arch aneurysm (HCC)    Bipolar disorder (HCC)    BRCA negative 2014   CAD (coronary artery disease)    a. 08/2003 Cath: LAD 30-40-med Rx; b. 11/2014 PCI: LAD 75m(3.25x23 Xience Alpine DES); c. 06/2015 PCI: D1 ((7.84O96Resolute Integrity DES); d. 06/2017 PCI: Patent mLAD stent, D2 95 (PTCA); e. 09/2017 PCI: D2 99ost (CBA); d. 12/2017 Cath: LM nl, LAD 333m80d (small), D1 40ost, D2 95ost, LCX 40p, RCA 40ost/p->Med rx for D2 given restenosis.   CKD (chronic kidney disease), stage III (HCC)    Colon polyp    CVA (cerebral vascular accident) ([place]   Left side weakness.    Diabetes ([place]   Family history of breast cancer    BRCA neg 2014   Gastric ulcer 04/27/2011   History of echocardiogram    a. 03/2017 Echo: EF 60-65%, no rwma; b. 02/2018 Echo: EF 60-65%, no rwma. Nl RV fxn. No cardiac source of emboli (admitted w/ stroke).   HTN (hypertension)    Hyperlipemia    Hypothyroid    Malignant melanoma of skin of scalp (HCC)    MI, acute, non ST segment elevation (HCC)    Neuromuscular disorder (HCC)    Orthostatic hypotension    S/P drug eluting coronary stent placement 06/04/2015   Sepsis ([place]/14/2019   [place] ([place]   a. 02/2018 MRI: 54m82mate acute/early subacute L medial frontal lobe inarct; b. 02/2018 MRA No large vessel occlusion or aneurysm. Mod to sev L P2 stenosis. thready L vertebral artery, diffusely dzs'd; c. 02/2018 Carotid U/S: <50% bilat ICA dzs.  Past Surgical History:  Procedure Laterality Date   APPENDECTOMY     CARDIAC CATHETERIZATION N/A 11/09/2014   Procedure: Coronary Angiography;  Surgeon: Minna Merritts, MD;  Location: [place] [facility];  Service: Cardiovascular;  Laterality: N/A;   CARDIAC CATHETERIZATION N/A 11/12/2014   Procedure: Coronary Stent Intervention;  Surgeon: Isaias Cowman, MD;  Location: [place] [facility];  Service: Cardiovascular;  Laterality: N/A;   CARDIAC CATHETERIZATION N/A  04/18/2015   Procedure: Left Heart Cath and Coronary Angiography;  Surgeon: Minna Merritts, MD;  Location: [place] [facility];  Service: Cardiovascular;  Laterality: N/A;   CARDIAC CATHETERIZATION Left 06/04/2015   Procedure: Left Heart Cath and Coronary Angiography;  Surgeon: Wellington Hampshire, MD;  Location: [place] [facility];  Service: Cardiovascular;  Laterality: Left;   CARDIAC CATHETERIZATION N/A 06/04/2015   Procedure: Coronary Stent Intervention;  Surgeon: Wellington Hampshire, MD;  Location: [place] [facility];  Service: Cardiovascular;  Laterality: N/A;   CESAREAN SECTION  2001   CHOLECYSTECTOMY N/A 11/18/2016   Procedure: LAPAROSCOPIC CHOLECYSTECTOMY WITH INTRAOPERATIVE CHOLANGIOGRAM;  Surgeon: Christene Lye, MD;  Location: [facility];  Service: General;  Laterality: N/A;   COLONOSCOPY WITH PROPOFOL N/A 04/27/2016   Procedure: COLONOSCOPY WITH PROPOFOL;  Surgeon: [name], MD;  Location: [place];  Service: Endoscopy;  Laterality: N/A;   COLONOSCOPY WITH PROPOFOL N/A 01/12/2018   Procedure: COLONOSCOPY WITH PROPOFOL;  Surgeon: Toledo, Benay Pike, MD;  Location: [facility];  Service: Endoscopy;  Laterality: N/A;   CORONARY ANGIOPLASTY     CORONARY BALLOON ANGIOPLASTY N/A 06/29/2017   Procedure: CORONARY BALLOON ANGIOPLASTY;  Surgeon: Wellington Hampshire, MD;  Location: [place] [facility];  Service: Cardiovascular;  Laterality: N/A;   CORONARY BALLOON ANGIOPLASTY N/A 09/20/2017   Procedure: CORONARY BALLOON ANGIOPLASTY;  Surgeon: Wellington Hampshire, MD;  Location: [place] [facility];  Service: Cardiovascular;  Laterality: N/A;   DILATION AND CURETTAGE OF UTERUS     ESOPHAGOGASTRODUODENOSCOPY (EGD) WITH PROPOFOL N/A 09/14/2014   Procedure: ESOPHAGOGASTRODUODENOSCOPY (EGD) WITH PROPOFOL;  Surgeon: Josefine Class, MD;  Location: [facility];  Service: Endoscopy;  Laterality: N/A;   ESOPHAGOGASTRODUODENOSCOPY (EGD) WITH PROPOFOL N/A 04/27/2016    Procedure: ESOPHAGOGASTRODUODENOSCOPY (EGD) WITH PROPOFOL;  Surgeon: [name], MD;  Location: [place];  Service: Endoscopy;  Laterality: N/A;  Diabetic - oral meds   ESOPHAGOGASTRODUODENOSCOPY (EGD) WITH PROPOFOL N/A 01/12/2018   Procedure: ESOPHAGOGASTRODUODENOSCOPY (EGD) WITH PROPOFOL;  Surgeon: Toledo, Benay Pike, MD;  Location: [facility];  Service: Endoscopy;  Laterality: N/A;   GASTRIC BYPASS  09/2009   [facility]    Left Carotid to sublcavian artery bypass w/ subclavian artery ligation     a. Performed @ [facility].   LEFT HEART CATH AND CORONARY ANGIOGRAPHY Left 06/29/2017   Procedure: LEFT HEART CATH AND CORONARY ANGIOGRAPHY;  Surgeon: Wellington Hampshire, MD;  Location: [place] [facility];  Service: Cardiovascular;  Laterality: Left;   LEFT HEART CATH AND CORONARY ANGIOGRAPHY N/A 09/20/2017   Procedure: LEFT HEART CATH AND CORONARY ANGIOGRAPHY;  Surgeon: Wellington Hampshire, MD;  Location: [place] [facility];  Service: Cardiovascular;  Laterality: N/A;   LEFT HEART CATH AND CORONARY ANGIOGRAPHY N/A 12/20/2017   Procedure: LEFT HEART CATH AND CORONARY ANGIOGRAPHY;  Surgeon: Wellington Hampshire, MD;  Location: [place] [facility];  Service: Cardiovascular;  Laterality: N/A;   MELANOMA EXCISION  2016   Dr. Henreitta Cea ABLATION  2002   RIGHT OOPHORECTOMY  SHOULDER ARTHROSCOPY WITH OPEN ROTATOR CUFF REPAIR Right 01/07/2016   Procedure: SHOULDER ARTHROSCOPY WITH DEBRIDMENT, SUBACHROMIAL DECOMPRESSION;  Surgeon: Corky Mull, MD;  Location: [facility];  Service: Orthopedics;  Laterality: Right;   SHOULDER ARTHROSCOPY WITH OPEN ROTATOR CUFF REPAIR Right 03/16/2017   Procedure: SHOULDER ARTHROSCOPY WITH OPEN ROTATOR CUFF REPAIR POSSIBLE BICEPS TENODESIS;  Surgeon: Corky Mull, MD;  Location: [facility];  Service: Orthopedics;  Laterality: Right;   TRIGGER FINGER RELEASE Right     [place]     reports that she quit smoking about 24 years  ago. Her smoking use included cigarettes. She has never used smokeless tobacco. She reports that she does not drink alcohol or use drugs.  Allergies  Allergen Reactions   Lipitor [Atorvastatin] Other (See Comments)    Leg pains   Tramadol Other (See Comments)    Mouth feels like it's on fire   Lactose Intolerance (Gi) Nausea Only    Family History  Problem Relation Age of Onset   Hypertension Mother    Anxiety disorder Mother    Depression Mother    Bipolar disorder Mother    Heart disease Mother        No details   Hyperlipidemia Mother    Kidney disease Father    Heart disease Father 52   Hypertension Father    Diabetes Father    Stroke Father    Colon cancer Father        dx in his 51's   Anxiety disorder Father    Depression Father    Skin cancer Father    Kidney disease Sister    Thyroid nodules Sister    Hypertension Sister    Hypertension Sister    Diabetes Sister    Hyperlipidemia Sister    Depression Sister    Breast cancer Maternal Aunt 1   Breast cancer Maternal Aunt 68   Ovarian cancer Cousin    Colon cancer Cousin    Breast cancer Other    Kidney cancer Neg Hx    Bladder Cancer Neg Hx    Family history reviewed and not pertinent   Prior to Admission medications   Medication Sig Start Date End Date Taking? Authorizing Provider  ALPRAZolam (XANAX) 1 MG tablet TAKE 1 TABLET BY MOUTH EVERY DAY AS NEEDED FOR ANXIETY Patient taking differently: Take 1 mg by mouth as needed for anxiety.  09/11/18  Yes [name], MD  amoxicillin (AMOXIL) 500 MG capsule Take 2 capsules (1,000 mg total) by mouth 2 (two) times daily for 14 days. 10/01/18 10/15/18 Yes [name], MD  buPROPion (WELLBUTRIN XL) 150 MG 24 hr tablet TAKE 1 TABLET BY MOUTH EVERY DAY Patient taking differently: Take 150 mg by mouth daily.  09/27/18  Yes [name], MD  clarithromycin (BIAXIN) 500 MG tablet Take 1 tablet (500 mg total) by mouth 2 (two)  times daily for 14 days. 10/01/18 10/15/18 Yes [name], MD  clopidogrel (PLAVIX) 75 MG tablet Take 1 tablet (75 mg total) by mouth daily. 07/08/18  Yes Theora Gianotti, NP  diphenoxylate-atropine (LOMOTIL) 2.5-0.025 MG tablet Take one capsule 1-4 times daily as needed for diarrhea 10/12/18  Yes Fisher, Kirstie Peri, MD  gabapentin (NEURONTIN) 300 MG capsule Take 1 capsule (300 mg total) by mouth 2 (two) times daily. 10/12/18  Yes [name], MD  isosorbide mononitrate (IMDUR) 30 MG 24 hr tablet Take 1 tablet (30 mg total) by mouth daily. 09/15/18  Yes Gollan,  Kathlene November, MD  levothyroxine (SYNTHROID, LEVOTHROID) 25 MCG tablet Take 1 tablet (25 mcg total) by mouth daily before breakfast. 03/28/18  Yes [name], MD  losartan (COZAAR) 25 MG tablet Take 25 mg by mouth daily.   Yes [provider]  metoprolol succinate (TOPROL XL) 25 MG 24 hr tablet Take 0.5 tablets (12.5 mg total) by mouth daily. 07/08/18 07/08/19 Yes Theora Gianotti, NP  nitroGLYCERIN (NITROSTAT) 0.4 MG SL tablet Place 1 tablet (0.4 mg total) under the tongue every 5 (five) minutes as needed for chest pain. 06/02/17  Yes Gollan, Kathlene November, MD  pantoprazole (PROTONIX) 40 MG tablet Take 1 tablet (40 mg total) by mouth 2 (two) times daily. 03/02/18  Yes [name], MD  promethazine (PHENERGAN) 25 MG tablet Take 25 mg by mouth every 6 (six) hours as needed for nausea or vomiting.   Yes [provider]  rosuvastatin (CRESTOR) 40 MG tablet Take 1 tablet (40 mg total) by mouth daily. 10/12/18  Yes [name], MD  tiZANidine (ZANAFLEX) 4 MG tablet Take 4 mg by mouth daily as needed for pain. 03/07/18  Yes [provider]  venlafaxine XR (EFFEXOR-XR) 75 MG 24 hr capsule Take 1 capsule (75 mg total) by mouth daily with breakfast. 09/27/18  Yes [name], MD  Blood Glucose Monitoring Suppl (ONE TOUCH ULTRA 2) w/Device KIT Use to check blood sugar once a day. Dx. E11.9 03/18/17   [name], MD  diclofenac sodium (VOLTAREN) 1 % GEL Apply 2 g topically 3 (three) times daily as needed. Patient not taking: Reported on 10/12/2018 08/16/18   Karen Kitchens, NP  ondansetron (ZOFRAN) 4 MG tablet Take 1 tablet (4 mg total) by mouth every 8 (eight) hours as needed. Patient not taking: Reported on 10/12/2018 04/29/18   Mar Daring, [place]    Physical Exam: Vitals:   10/12/18 2230 10/12/18 2319 10/13/18 0201 10/13/18 0456  BP: (!) 160/83 (!) 196/86 (!) 198/97 (!) 141/85  Pulse: 72 72 72 70  Resp: 11   18  Temp:  97.9 F (36.6 C)  98.1 F (36.7 C)  TempSrc:  Oral  Oral  SpO2: 96% 98%  100%    Constitutional: NAD, calm, comfortable Vitals:   10/12/18 2230 10/12/18 2319 10/13/18 0201 10/13/18 0456  BP: (!) 160/83 (!) 196/86 (!) 198/97 (!) 141/85  Pulse: 72 72 72 70  Resp: 11   18  Temp:  97.9 F (36.6 C)  98.1 F (36.7 C)  TempSrc:  Oral  Oral  SpO2: 96% 98%  100%   Eyes: PERRL, lids and conjunctivae normal ENMT: Mucous membranes are moist. Posterior pharynx clear of any exudate or lesions.Normal dentition.  Neck: normal, supple, no masses, no thyromegaly Respiratory: clear to auscultation bilaterally, no wheezing, no crackles. Normal respiratory effort. No accessory muscle use.  Cardiovascular: Regular rate and rhythm, no murmurs / rubs / gallops. No extremity edema. 2+ pedal pulses. No carotid bruits.  Abdomen: no tenderness, no masses palpated. No hepatosplenomegaly. Bowel sounds positive.  Musculoskeletal: no clubbing / cyanosis. No joint deformity upper and lower extremities. no contractures. Normal muscle tone.  Unable to lift left lower extremity secondary to pain. Skin: no rashes, lesions, ulcers. No induration.  No bruises or hematoma. Neurologic: CN 2-12 grossly intact. Sensation intact, DTR normal. Strength 5/5 in except in left lower extremity.Marland Kitchen  Psychiatric: Normal judgment and insight. Alert and oriented x 3. Normal mood.    Labs on Admission:  I have  personally reviewed following labs and imaging studies  CBC: Recent Labs  Lab 10/12/18 1522  WBC 10.9*  HGB 9.7*  HCT 32.4*  MCV 74.1*  PLT 449   Basic Metabolic Panel: Recent Labs  Lab 10/12/18 1522  NA 140  K 4.2  CL 108  CO2 25  GLUCOSE 69*  BUN 16  CREATININE 1.81*  CALCIUM 8.5*   GFR: Estimated Creatinine Clearance: 35.1 mL/min (A) (by C-G formula based on SCr of 1.81 mg/dL (H)). Liver Function Tests: No results for input(s): AST, ALT, ALKPHOS, BILITOT, PROT, ALBUMIN in the last 168 hours. No results for input(s): LIPASE, AMYLASE in the last 168 hours. No results for input(s): AMMONIA in the last 168 hours. Coagulation Profile: No results for input(s): INR, PROTIME in the last 168 hours. Cardiac Enzymes: No results for input(s): CKTOTAL, CKMB, CKMBINDEX, TROPONINI in the last 168 hours. BNP (last 3 results) No results for input(s): PROBNP in the last 8760 hours. HbA1C: No results for input(s): HGBA1C in the last 72 hours. CBG: Recent Labs  Lab 10/12/18 1932  GLUCAP 128*   Lipid Profile: No results for input(s): CHOL, HDL, LDLCALC, TRIG, CHOLHDL, LDLDIRECT in the last 72 hours. Thyroid Function Tests: No results for input(s): TSH, T4TOTAL, FREET4, T3FREE, THYROIDAB in the last 72 hours. Anemia Panel: No results for input(s): VITAMINB12, FOLATE, FERRITIN, TIBC, IRON, RETICCTPCT in the last 72 hours. Urine analysis:    Component Value Date/Time   COLORURINE STRAW (A) 06/25/2018 2131   APPEARANCEUR HAZY (A) 06/25/2018 2131   APPEARANCEUR Cloudy (A) 07/17/2014 1455   LABSPEC 1.003 (L) 06/25/2018 2131   LABSPEC 1.012 12/29/2013 1210   PHURINE 6.0 06/25/2018 2131   GLUCOSEU NEGATIVE 06/25/2018 2131   GLUCOSEU >=500 12/29/2013 1210   HGBUR NEGATIVE 06/25/2018 2131   HGBUR negative 04/14/2010 1435   BILIRUBINUR Negative 07/04/2018 1605   BILIRUBINUR Negative 07/17/2014 1455   BILIRUBINUR Negative 12/29/2013 1210   KETONESUR NEGATIVE 06/25/2018 2131    PROTEINUR Negative 07/04/2018 1605   PROTEINUR NEGATIVE 06/25/2018 2131   UROBILINOGEN 0.2 07/04/2018 1605   UROBILINOGEN negative 04/14/2010 1435   NITRITE Negative 07/04/2018 1605   NITRITE NEGATIVE 06/25/2018 2131   LEUKOCYTESUR Moderate (2+) (A) 07/04/2018 1605   LEUKOCYTESUR NEGATIVE 06/25/2018 2131   LEUKOCYTESUR Negative 12/29/2013 1210    Radiological Exams on Admission: Dg Chest 1 View  Result Date: 10/12/2018 CLINICAL DATA:  Fall. EXAM: CHEST  1 VIEW COMPARISON:  Radiograph Jun 25, 2018. FINDINGS: Stable cardiomediastinal silhouette. No pneumothorax or pleural effusion is noted. Both lungs are clear. The visualized skeletal structures are unremarkable. IMPRESSION: No active disease. Electronically Signed   By: Marijo Conception M.D.   On: 10/12/2018 17:01   Dg Pelvis 1-2 Views  Result Date: 10/12/2018 CLINICAL DATA:  Fall. EXAM: PELVIS - 1-2 VIEW COMPARISON:  None. FINDINGS: Minimally displaced fracture is seen involving the intertrochanteric region of the proximal left femur. Right hip is unremarkable. Pelvis appears normal. IMPRESSION: Minimally displaced intertrochanteric fracture of the proximal left femur. Electronically Signed   By: Marijo Conception M.D.   On: 10/12/2018 16:58   Ct Head Wo Contrast  Result Date: 10/12/2018 CLINICAL DATA:  Status post fall backwards.  Hit her head on a car. EXAM: CT HEAD WITHOUT CONTRAST CT CERVICAL SPINE WITHOUT CONTRAST TECHNIQUE: Multidetector CT imaging of the head and cervical spine was performed following the standard protocol without intravenous contrast. Multiplanar CT image reconstructions of the cervical spine were also generated. COMPARISON:  06/05/2012 FINDINGS: CT HEAD FINDINGS Brain: No evidence of acute infarction, hemorrhage, hydrocephalus, extra-axial collection or mass lesion/mass effect. Old right basal ganglia lacunar infarct. Periventricular white matter low attenuation as can be seen with microvascular disease. Mild generalized  cerebral atrophy. Vascular: No hyperdense vessel. Intracranial atherosclerotic disease. Skull: No osseous abnormality. Sinuses/Orbits: Visualized paranasal sinuses are clear. Visualized mastoid sinuses are clear. Visualized orbits demonstrate no focal abnormality. Other: None CT CERVICAL SPINE FINDINGS Alignment: Normal. Skull base and vertebrae: No acute fracture. No primary bone lesion or focal pathologic process. Soft tissues and spinal canal: No prevertebral fluid or swelling. No visible canal hematoma. Disc levels:  Disc spaces are maintained.  No foraminal stenosis. Upper chest: Lung apices are clear. Right-sided aortic arch with ligation of the left subclavian artery. Other: No fluid collection or hematoma. IMPRESSION: 1. No acute intracranial pathology. 2.  No acute osseous injury of the cervical spine. Electronically Signed   By: Kathreen Devoid   On: 10/12/2018 17:17   Ct Cervical Spine Wo Contrast  Result Date: 10/12/2018 CLINICAL DATA:  Status post fall backwards.  Hit her head on a car. EXAM: CT HEAD WITHOUT CONTRAST CT CERVICAL SPINE WITHOUT CONTRAST TECHNIQUE: Multidetector CT imaging of the head and cervical spine was performed following the standard protocol without intravenous contrast. Multiplanar CT image reconstructions of the cervical spine were also generated. COMPARISON:  06/05/2012 FINDINGS: CT HEAD FINDINGS Brain: No evidence of acute infarction, hemorrhage, hydrocephalus, extra-axial collection or mass lesion/mass effect. Old right basal ganglia lacunar infarct. Periventricular white matter low attenuation as can be seen with microvascular disease. Mild generalized cerebral atrophy. Vascular: No hyperdense vessel. Intracranial atherosclerotic disease. Skull: No osseous abnormality. Sinuses/Orbits: Visualized paranasal sinuses are clear. Visualized mastoid sinuses are clear. Visualized orbits demonstrate no focal abnormality. Other: None CT CERVICAL SPINE FINDINGS Alignment: Normal. Skull  base and vertebrae: No acute fracture. No primary bone lesion or focal pathologic process. Soft tissues and spinal canal: No prevertebral fluid or swelling. No visible canal hematoma. Disc levels:  Disc spaces are maintained.  No foraminal stenosis. Upper chest: Lung apices are clear. Right-sided aortic arch with ligation of the left subclavian artery. Other: No fluid collection or hematoma. IMPRESSION: 1. No acute intracranial pathology. 2.  No acute osseous injury of the cervical spine. Electronically Signed   By: Kathreen Devoid   On: 10/12/2018 17:17   Dg Shoulder Left  Result Date: 10/12/2018 CLINICAL DATA:  Status post fall backwards. EXAM: LEFT SHOULDER - 2+ VIEW COMPARISON:  None. FINDINGS: There is no fracture or dislocation. There is minimal osteoarthritis of the left glenohumeral joint. There are mild degenerative changes of the acromioclavicular joint. IMPRESSION: No acute osseous injury of the left shoulder. Electronically Signed   By: Kathreen Devoid   On: 10/12/2018 16:59   Dg Femur Min 2 Views Left  Result Date: 10/12/2018 CLINICAL DATA:  Fall. EXAM: LEFT FEMUR 2 VIEWS COMPARISON:  None. FINDINGS: Minimally displaced fracture is seen involving intertrochanteric region of proximal left femur. Vascular calcifications are noted. Remaining portion of femur is unremarkable. IMPRESSION: Minimally displaced intertrochanteric fracture of proximal left femur. Electronically Signed   By: Marijo Conception M.D.   On: 10/12/2018 16:59    EKG: Independently reviewed.   Assessment/Plan  Closed nondisplaced intertrochanteric fracture of the left femur - ED physician has already consulted orthopedics who will see in the morning - Morphine as needed for pain - Non- weightbearing  Questionable GI symptoms -Patient reports that she was given antibiotics by  outpatient physician for diarrhea but states that she chronically has IBS. -Given patient's afebrile with no leukocytosis Will DC antibiotics.  History  of CVA -Hold Plavix in anticipation for orthopedic surgery tomorrow  Iron deficiency anemia - Stable.  Continue to monitor with CBC.  History of hypertension -Continue home blood pressure medication  Hyperlipidemia -Continue Crestor  History of depression/anxiety -Continue home medication  Hypothyroidism -Continue levothyroxine    DVT prophylaxis: SCDs Code Status: Full Family Communication: No family at bedside Disposition Plan:   Will require to [facility] inpatient for orthopedic surgery and plans to be discharged to rehab Consults called: Orthopedic Admission status: Inpatient to [facility]   If 7PM-7AM, please contact night-coverage [email or web address] Password [email or web address]  10/13/2018, 5:00 AM

## 2018-10-13 NOTE — Anesthesia Preprocedure Evaluation (Addendum)
Anesthesia Evaluation  Patient identified by MRN, date of birth, ID band Patient awake    Reviewed: Allergy & Precautions, NPO status , Patient's Chart, lab work & pertinent test results, reviewed documented beta blocker date and time   Airway Mallampati: I  TM Distance: >3 FB Neck ROM: Full    Dental  (+) Edentulous Lower, Edentulous Upper   Pulmonary former smoker,    Pulmonary exam normal breath sounds clear to auscultation       Cardiovascular hypertension, Pt. on medications and Pt. on home beta blockers + angina + CAD, + Past MI and +CHF  Normal cardiovascular exam Rhythm:Regular Rate:Normal  Echo 02/18/2018 Left ventricle: The cavity size was normal. Systolic function was normal. The estimated ejection fraction was in the range of 60% to 65%. Wall motion was normal; there were no regional wall motion abnormalities. Left ventricular diastolic function parameters were normal. - Left atrium: The atrium was normal in size. - Right ventricle: Systolic function was normal. - Pulmonary arteries: Systolic pressure was within the normal range.  EKG 10/2018 NSR   Neuro/Psych  Headaches, PSYCHIATRIC DISORDERS Anxiety Depression Bipolar Disorder TIA Neuromuscular disease CVA, Residual Symptoms    GI/Hepatic Neg liver ROS, PUD, GERD  Medicated and Controlled,S/P Gastric bypass   Endo/Other  diabetes, Well Controlled, Type 2, Oral Hypoglycemic AgentsHypothyroidism Hyperlipidemia  Renal/GU Renal InsufficiencyRenal disease Bladder dysfunction      Musculoskeletal  (+) Arthritis , Osteoarthritis,  Subtrochanteric left femur Fx   Abdominal   Peds  Hematology  (+) anemia , Plavix therapy- last dose 2 days ago   Anesthesia Other Findings   Reproductive/Obstetrics                             Anesthesia Physical Anesthesia Plan  ASA: III  Anesthesia Plan: General   Post-op Pain Management:     Induction: Intravenous  PONV Risk Score and Plan: 4 or greater and Ondansetron, Midazolam, Dexamethasone and Treatment may vary due to age or medical condition  Airway Management Planned: LMA  Additional Equipment:   Intra-op Plan:   Post-operative Plan: Extubation in OR  Informed Consent: I have reviewed the patients History and Physical, chart, labs and discussed the procedure including the risks, benefits and alternatives for the proposed anesthesia with the patient or authorized representative who has indicated his/her understanding and acceptance.     Dental advisory given  Plan Discussed with: CRNA and Surgeon  Anesthesia Plan Comments:         Anesthesia Quick Evaluation

## 2018-10-13 NOTE — Transfer of Care (Signed)
Immediate Anesthesia Transfer of Care Note  Patient: [name]  Procedure(s) Performed: INTRAMEDULLARY (IM) NAIL INTERTROCHANTRIC (Left Hip)  Patient Location: PACU  Anesthesia Type:General  Level of Consciousness: drowsy, patient cooperative and responds to stimulation  Airway & Oxygen Therapy: Patient Spontanous Breathing and Patient connected to face mask oxygen  Post-op Assessment: Report given to RN and Post -op Vital signs reviewed and stable  Post vital signs: Reviewed and stable  Last Vitals:  Vitals Value Taken Time  BP 162/93 10/13/18 1601  Temp 36.1 C 10/13/18 1600  Pulse 89 10/13/18 1605  Resp 7 10/13/18 1605  SpO2 100 % 10/13/18 1605  Vitals shown include unvalidated device data.  Last Pain:  Vitals:   10/13/18 0834  TempSrc:   PainSc: 7          Complications: No apparent anesthesia complications

## 2018-10-13 NOTE — H&P (Signed)
H&P update  The surgical history has been reviewed and remains accurate without interval change.  The patient was re-examined and patient's physiologic condition has not changed significantly in the last 30 days. The condition still exists that makes this procedure necessary. The treatment plan remains the same, without new options for care.  No new pharmacological allergies or types of therapy has been initiated that would change the plan or the appropriateness of the plan.  The patient and/or family understand the potential benefits and risks.  Azucena Cecil, MD 10/13/2018 7:55 AM

## 2018-10-14 LAB — BASIC METABOLIC PANEL
Anion gap: 7 (ref 5–15)
BUN: 19 mg/dL (ref 6–20)
CO2: 26 mmol/L (ref 22–32)
Calcium: 8.7 mg/dL — ABNORMAL LOW (ref 8.9–10.3)
Chloride: 105 mmol/L (ref 98–111)
Creatinine, Ser: 1.89 mg/dL — ABNORMAL HIGH (ref 0.44–1.00)
GFR calc Af Amer: 35 mL/min — ABNORMAL LOW (ref >60)
GFR calc non Af Amer: 31 mL/min — ABNORMAL LOW (ref >60)
Glucose, Bld: 183 mg/dL — ABNORMAL HIGH (ref 70–99)
Potassium: 5 mmol/L (ref 3.5–5.1)
Sodium: 138 mmol/L (ref 135–145)

## 2018-10-14 LAB — COMPREHENSIVE METABOLIC PANEL
ALT: 32 IU/L (ref 0–32)
AST: 28 IU/L (ref 0–40)
Albumin/Globulin Ratio: 1.5 (ref 1.2–2.2)
Albumin: 3.5 g/dL — ABNORMAL LOW (ref 3.8–4.8)
Alkaline Phosphatase: 156 IU/L — ABNORMAL HIGH (ref 39–117)
BUN/Creatinine Ratio: 10 (ref 9–23)
BUN: 17 mg/dL (ref 6–24)
Bilirubin Total: 0.2 mg/dL (ref 0.0–1.2)
CO2: 23 mmol/L (ref 20–29)
Calcium: 8.9 mg/dL (ref 8.7–10.2)
Chloride: 104 mmol/L (ref 96–106)
Creatinine, Ser: 1.78 mg/dL — ABNORMAL HIGH (ref 0.57–1.00)
GFR calc Af Amer: 38 mL/min/{1.73_m2} — ABNORMAL LOW (ref >59)
GFR calc non Af Amer: 33 mL/min/{1.73_m2} — ABNORMAL LOW (ref >59)
Globulin, Total: 2.4 g/dL (ref 1.5–4.5)
Glucose: 128 mg/dL — ABNORMAL HIGH (ref 65–99)
Potassium: 4.3 mmol/L (ref 3.5–5.2)
Sodium: 141 mmol/L (ref 134–144)
Total Protein: 5.9 g/dL — ABNORMAL LOW (ref 6.0–8.5)

## 2018-10-14 LAB — BRAIN NATRIURETIC PEPTIDE: BNP: 52.2 pg/mL (ref 0.0–100.0)

## 2018-10-14 LAB — CBC
HCT: 31 % — ABNORMAL LOW (ref 36.0–46.0)
Hematocrit: 33.4 % — ABNORMAL LOW (ref 34.0–46.6)
Hemoglobin: 10.4 g/dL — ABNORMAL LOW (ref 11.1–15.9)
Hemoglobin: 9.3 g/dL — ABNORMAL LOW (ref 12.0–15.0)
MCH: 22.8 pg — ABNORMAL LOW (ref 26.6–33.0)
MCH: 22.1 pg — ABNORMAL LOW (ref 26.0–34.0)
MCHC: 31.1 g/dL — ABNORMAL LOW (ref 31.5–35.7)
MCHC: 30 g/dL (ref 30.0–36.0)
MCV: 73 fL — ABNORMAL LOW (ref 79–97)
MCV: 73.8 fL — ABNORMAL LOW (ref 80.0–100.0)
Platelets: 216 10*3/uL (ref 150–450)
Platelets: 168 10*3/uL (ref 150–400)
RBC: 4.56 x10E6/uL (ref 3.77–5.28)
RBC: 4.2 MIL/uL (ref 3.87–5.11)
RDW: 16.7 % — ABNORMAL HIGH (ref 11.7–15.4)
RDW: 17.7 % — ABNORMAL HIGH (ref 11.5–15.5)
WBC: 7 10*3/uL (ref 3.4–10.8)
WBC: 13.5 10*3/uL — ABNORMAL HIGH (ref 4.0–10.5)
nRBC: 0 % (ref 0.0–0.2)

## 2018-10-14 LAB — GLUCOSE, CAPILLARY
Glucose-Capillary: 152 mg/dL — ABNORMAL HIGH (ref 70–99)
Glucose-Capillary: 250 mg/dL — ABNORMAL HIGH (ref 70–99)
Glucose-Capillary: 190 mg/dL — ABNORMAL HIGH (ref 70–99)
Glucose-Capillary: 257 mg/dL — ABNORMAL HIGH (ref 70–99)

## 2018-10-14 LAB — IRON,TIBC AND FERRITIN PANEL
Ferritin: 19 ng/mL (ref 15–150)
Iron Saturation: 16 % (ref 15–55)
Iron: 37 ug/dL (ref 27–159)
Total Iron Binding Capacity: 228 ug/dL — ABNORMAL LOW (ref 250–450)
UIBC: 191 ug/dL (ref 131–425)

## 2018-10-14 LAB — HEMOGLOBIN A1C
Est. average glucose Bld gHb Est-mCnc: 105 mg/dL
Hgb A1c MFr Bld: 5.3 % (ref 4.8–5.6)
Hgb A1c MFr Bld: 5.4 % (ref 4.8–5.6)
Mean Plasma Glucose: 108 mg/dL

## 2018-10-14 NOTE — TOC Initial Note (Signed)
Transition of Care [facility]) - Initial/Assessment Note    Patient Details  Name: [name] MRN: [id number] Date of Birth: 1970/04/02  [facility] CM/SW Contact:    Gelene Mink, [place] Phone Number: 10/14/2018, 1:55 PM  Clinical Narrative:                  CSW met with the patient at bedside. CSW introduced herself and explained her role. CSW provided the therapy recommendation. CSW asked if the patient would be willing to go to rehab, she stated that she is hesitant due to COVID-19. CSW asked if she has support at home. She stated that she lives with her son and his roommate. She stated that her son works and goes to school at night so she couldn't stay with him. She stated that she has her mother and she is willing to help care for her. CSW asked if her mother could provide 24/7 support and could lift her, she stated she could. The patient asked if she could discuss it with her son, she stated that he would be coming to the hospital this evening after work. CSW agreed and stated that she would follow up on Saturday.   CSW will continue to follow and assist with disposition planning.   Expected Discharge Plan: (SNF vs. HH) Barriers to Discharge: Continued Medical Work up   Patient Goals and CMS Choice Patient states their goals for this hospitalization and ongoing recovery are:: Pt is deciding between SNF and home with home health CMS Medicare.gov Compare Post Acute Care list provided to:: Patient Choice offered to / list presented to : Patient  Expected Discharge Plan and Services Expected Discharge Plan: (SNF vs. [place]) In-house Referral: Clinical Social Work Discharge Planning Services: NA Post Acute Care Choice: [place] arrangements for the past 2 months: Single Family Home                 DME Arranged: N/A DME Agency: NA       HH Arranged: NA [place] Agency: NA        Prior Living Arrangements/Services Living  arrangements for the past 2 months: [place] with:: Adult Children, Roommate Patient language and need for interpreter reviewed:: No Do you feel safe going back to the place where you live?: Yes      Need for Family Participation in Patient Care: No (Comment) Care giver support system in place?: Yes (comment)   Criminal Activity/Legal Involvement Pertinent to Current Situation/Hospitalization: No - Comment as needed  Activities of Daily Living Home Assistive Devices/Equipment: Cane (specify quad or straight) ADL Screening (condition at time of admission) Patient's cognitive ability adequate to safely complete daily activities?: Yes Is the patient deaf or have difficulty hearing?: No Does the patient have difficulty seeing, even when wearing glasses/contacts?: No Does the patient have difficulty concentrating, remembering, or making decisions?: No Patient able to express need for assistance with ADLs?: Yes Does the patient have difficulty dressing or bathing?: No Independently performs ADLs?: No Does the patient have difficulty walking or climbing stairs?: Yes Weakness of Legs: Both Weakness of Arms/Hands: None  Permission Sought/Granted Permission sought to share information with : Case Manager Permission granted to share information with : Yes, Verbal Permission Granted  Share Information with NAME: [name]     Permission granted to share info w Relationship: Son     Emotional Assessment Appearance:: Appears stated age Attitude/Demeanor/Rapport: Engaged Affect (typically observed): Calm Orientation: :  Oriented to Self, Oriented to Place, Oriented to  Time, Oriented to Situation Alcohol / Substance Use: Not Applicable Psych Involvement: No (comment)  Admission diagnosis:  Fall [W19.XXXA] Closed displaced intertrochanteric fracture of left femur, initial encounter ([place]) [S72.142A] Chronic kidney disease, unspecified CKD stage [N18.9] Patient Active Problem List    Diagnosis Date Noted  . Fall   . Closed nondisplaced intertrochanteric fracture of left femur ([place]) 10/12/2018  . Femur fracture, left ([place]) 10/12/2018  . Hypotension 06/26/2018  . Autonomic neuropathy 03/24/2018  . Acute delirium 03/03/2018  . H/O gastric bypass 03/02/2018  . Hypertension associated with stage 3 chronic kidney disease due to type 2 diabetes mellitus ([place]) 02/20/2018  . SI (sacroiliac) joint dysfunction 12/02/2017  . MI, acute, non ST segment elevation ([place])   . Effort angina ([place]) 06/29/2017  . Unstable angina ([place]) 06/24/2017  . Syncope 04/09/2017  . Insomnia 03/18/2017  . Ischemic cardiomyopathy   . Arthritis   . Anxiety   . Tendinitis of upper biceps tendon of right shoulder 03/16/2017  . Degenerative tear of glenoid labrum of right shoulder 03/15/2017  . Acute colitis 01/27/2017  . Hx of colonic polyps   . H/O medication noncompliance 12/14/2015  . Emesis   . Atherosclerosis of native coronary artery of native heart with stable angina pectoris ([place])   . Hypertensive heart disease   . CKD (chronic kidney disease), stage III ([place]) 06/05/2015  . Status post bariatric surgery 06/04/2015  . Colitis 06/03/2015  . Carotid stenosis 04/30/2015  . Type 2 diabetes mellitus with stage 3 chronic kidney disease, without long-term current use of insulin ([place])   . Stable angina pectoris ([place]) 04/17/2015  . Iron deficiency anemia 03/22/2015  . Vitamin B12 deficiency 02/18/2015  . Misuse of medications for pain 02/18/2015  . Major depressive disorder, recurrent, severe with psychotic features ([place]) 02/15/2015  . Helicobacter pylori infection 11/23/2014  . Hemiparesis, left ([place]) 11/23/2014  . Benign neoplasm of colon 11/20/2014  . Malignant melanoma ([place]) 08/25/2014  . Chronic systolic CHF (congestive heart failure) ([place])   . Incomplete bladder emptying 07/12/2014  . Hypothyroidism 12/30/2013  . Aberrant subclavian artery 11/17/2013  . Multiple sclerosis ([place])  11/02/2013  . History of CVA with residual deficit 06/20/2013  . Headache, migraine 05/29/2013  . Hyperlipidemia   . GERD (gastroesophageal reflux disease)   . Neuropathy ([place]) 01/02/2011  . Stroke ([place]) 06/21/2008  . Depression with anxiety 05/01/2008  . Essential hypertension 05/01/2008   PCP:  [name], MD Pharmacy:   [facility] [place] [address] [place] [address] [place]) [place] [address] Phone: [phone] Fax: [phone]     Social Determinants of Health (SDOH) Interventions    Readmission Risk Interventions Readmission Risk Prevention Plan 06/26/2018 06/16/2018  Transportation Screening Complete Complete  Medication Review Press photographer) Complete Complete  PCP or Specialist appointment within 3-5 days of discharge - Complete  HRI or Home Care Consult Complete Complete  SW Recovery Care/Counseling Consult - Not Complete  SW Consult Not Complete Comments - NA  Palliative Care Screening - Not [place] - Not Applicable  Some recent data might be hidden

## 2018-10-14 NOTE — Plan of Care (Signed)
  Problem: Education: Goal: Knowledge of General Education information will improve Description: Including pain rating scale, medication(s)/side effects and non-pharmacologic comfort measures Outcome: Progressing   Problem: Health Behavior/Discharge Planning: Goal: Ability to manage health-related needs will improve Outcome: Progressing   Problem: Clinical Measurements: Goal: Ability to maintain clinical measurements within normal limits will improve Outcome: Progressing Goal: Will remain free from infection Outcome: Progressing Goal: Diagnostic test results will improve Outcome: Progressing Goal: Respiratory complications will improve Outcome: Progressing Goal: Cardiovascular complication will be avoided Outcome: Progressing   Problem: Activity: Goal: Risk for activity intolerance will decrease Outcome: Progressing   Problem: Pain Managment: Goal: General experience of comfort will improve Outcome: Progressing   

## 2018-10-14 NOTE — Progress Notes (Signed)
[facility]                                                                              Patient Demographics  [address] [name], is a 48 y.o. female, DOB - May 12, 1970, UXL:[id number]  Admit date - 10/12/2018   Admitting Physician Anselm Jungling, DO  Outpatient Primary MD for the patient is Fisher, Demetrios Isaacs, MD  Outpatient specialists:   LOS - 2  days   Medical records reviewed and are as summarized below:    Chief Complaint  Patient presents with   Fall       Brief summary   Patient is a 48 year old female with history of CVA, MS, chronic systolic CHF, hypertension, diabetes type 2, stage III CKD, hyperlipidemia, depression presented to ED after mechanical fall.  Patient reports she has baseline unsteady gait, has frequent falls.  On the day of admission, she was climbing up one step of stairs, lost her balance and fell on her left side and also hit her head. CT head, C-spine negative. Left femur x-ray showed minimally displaced intertrochanteric fracture of the proximal left femur.   Assessment & Plan    Principal Problem:   Closed nondisplaced intertrochanteric fracture of left femur (HCC) secondary to mechanical fall -Orthopedics was consulted, patient underwent ORIF with intramedullary implant on 10/13/2018, postop day #1 - will start PT today - pain control, DVT prophylaxis per orthopedics -Per Ortho, DVT prophylaxis aspirin 325 mg x 6 weeks in addition to restarting Plavix at 10 AM -WBAT LLE, follow-up with Dr. Roda Shutters in 2 weeks  Active Problems: History of CVA -Restart Plavix, continue statin    Depression with anxiety -Continue Wellbutrin, Effexor, Xanax prn  Diabetes mellitus type 2, NIDDM, with neuropathy -Continue sliding scale insulin, hemoglobin A1c 5.4   Hyperlipidemia -Continue Crest  Hypothyroidism -Continue levothyroxine  Iron deficiency anemia, chronic gastritis -Continue PPI -Baseline hemoglobin ~10 -Follow CBC, hemoglobin 9.3  today  Hypertension Continue metoprolol, Imdur, losartan  Recent diagnosis of H. pylori gastritis -Patient has been following with her PCP closely, had chronic epigastric pain, nausea and vomiting.  Patient was found to have H. pylori gastritis -Ultrasound abdomen was unremarkable. -Patient was started on amoxicillin 1000 mg twice daily and Biaxin 500 mg twice a day for 14 days.  -Continue PPI Patient was seen by her PCP on 9/9, was recommended to finish the regimen and repeat H. pylori breath test after she has completed the therapy.  Stop date 10/17/2018  History of IBS -Placed on stool softeners, follow closely  Acute on CKD stage III -Baseline creatinine appears to be 1.5-1.7 -Presented with creatinine of 1.8-> 1.6-> 1.8 today  -Continue gentle IV fluid hydration, until tolerating p.o.  Leukocytosis Likely stress demargination, no fevers or chills or other complaints, follow CBC Chest x-ray 9/9 showed no pneumonia.  Code Status: Full code DVT Prophylaxis: Per orthopedics Family Communication: Discussed all imaging results, lab results, explained to the patient. Called patient's son, left a detailed voice message  Disposition Plan: Remains inpatient, postop day #1, will start PT today  Time Spent in minutes 35 minutes  Procedures:  Open treatment of intertrochanteric  fracture with intramedullary implant.  10/13/2018  Consultants:   Orthopedics  Antimicrobials:   Anti-infectives (From admission, onward)   Start     Dose/Rate Route Frequency Ordered Stop   10/13/18 2300  ceFAZolin (ANCEF) IVPB 2g/100 mL premix     2 g 200 mL/hr over 30 Minutes Intravenous Every 6 hours 10/13/18 1710 10/14/18 1659   10/13/18 1200  ceFAZolin (ANCEF) IVPB 2g/100 mL premix     2 g 200 mL/hr over 30 Minutes Intravenous To Short Stay 10/12/18 2324 10/13/18 1458   10/13/18 1200  amoxicillin (AMOXIL) capsule 1,000 mg     1,000 mg Oral 2 times daily 10/13/18 1111 10/17/18 2159   10/13/18 1200   clarithromycin (BIAXIN) tablet 500 mg     500 mg Oral 2 times daily 10/13/18 1111 10/17/18 2159         Medications  Scheduled Meds:  acetaminophen  500 mg Oral Q6H   amoxicillin  1,000 mg Oral BID   aspirin EC  325 mg Oral Daily   buPROPion  150 mg Oral Daily   clarithromycin  500 mg Oral BID   clopidogrel  75 mg Oral Daily   docusate sodium  100 mg Oral BID   gabapentin  300 mg Oral BID   insulin aspart  0-5 Units Subcutaneous QHS   insulin aspart  0-9 Units Subcutaneous TID WC   isosorbide mononitrate  30 mg Oral Daily   levothyroxine  25 mcg Oral QAC breakfast   losartan  25 mg Oral Daily   metoprolol succinate  12.5 mg Oral Daily   pantoprazole  40 mg Oral BID   rosuvastatin  40 mg Oral Daily   venlafaxine XR  75 mg Oral Q breakfast   Continuous Infusions:  sodium chloride 75 mL/hr at 10/13/18 1829    ceFAZolin (ANCEF) IV 2 g (10/14/18 1025)   methocarbamol (ROBAXIN) IV     PRN Meds:.acetaminophen, ALPRAZolam, alum & mag hydroxide-simeth, diphenoxylate-atropine, HYDROcodone-acetaminophen, HYDROcodone-acetaminophen, magnesium citrate, menthol-cetylpyridinium **OR** phenol, methocarbamol **OR** methocarbamol (ROBAXIN) IV, morphine injection, ondansetron **OR** ondansetron (ZOFRAN) IV, polyethylene glycol, sorbitol      Subjective:   [address] [name] was seen and examined today.  Pain in left hip, 4/10, controlled with pain medications.  No nausea, vomiting, fevers or chills.  Patient denies dizziness, chest pain, shortness of breath,  new weakness, numbess, tingling. No acute events overnight.    Objective:   Vitals:   10/13/18 2056 10/13/18 2356 10/14/18 0516 10/14/18 0755  BP: 121/79 128/90 132/84 (!) 144/84  Pulse: 88 86 79 80  Resp: 16 16 18 20   Temp: 97.9 F (36.6 C) 98 F (36.7 C) (!) 97.3 F (36.3 C) 98.6 F (37 C)  TempSrc: Oral Oral Oral Oral  SpO2: 94% 95% 97% 96%    Intake/Output Summary (Last 24 hours) at 10/14/2018  1037 Last data filed at 10/14/2018 0900 Gross per 24 hour  Intake 1075 ml  Output 885 ml  Net 190 ml     Wt Readings from Last 3 Encounters:  10/12/18 69.5 kg  08/31/18 72.1 kg  08/16/18 72.6 kg   Physical Exam  General: Alert and oriented x 3, NAD  Eyes:   HEENT:  Atraumatic, normocephalic  Cardiovascular: S1 S2 clear, no murmurs, RRR. No pedal edema b/l  Respiratory: CTAB, no wheezing, rales or rhonchi  Gastrointestinal: Soft, nontender, nondistended, NBS  Ext: no pedal edema bilaterally  Neuro: no new deficits, able to wiggle both toes  Musculoskeletal: No cyanosis, clubbing  Skin: No rashes  Psych: Normal affect and demeanor, alert and oriented x3     Data Reviewed:  I have personally reviewed following labs and imaging studies  Micro Results Recent Results (from the past 240 hour(s))  SARS Coronavirus 2 [facility] order, Performed in [facility] hospital lab) Nasopharyngeal Nasopharyngeal Swab     Status: None   Collection Time: 10/12/18  6:00 PM   Specimen: Nasopharyngeal Swab  Result Value Ref Range Status   SARS Coronavirus 2 NEGATIVE NEGATIVE Final    Comment: (NOTE) If result is NEGATIVE SARS-CoV-2 target nucleic acids are NOT DETECTED. The SARS-CoV-2 RNA is generally detectable in upper and lower  respiratory specimens during the acute phase of infection. The lowest  concentration of SARS-CoV-2 viral copies this assay can detect is 250  copies / mL. A negative result does not preclude SARS-CoV-2 infection  and should not be used as the sole basis for treatment or other  patient management decisions.  A negative result may occur with  improper specimen collection / handling, submission of specimen other  than nasopharyngeal swab, presence of viral mutation(s) within the  areas targeted by this assay, and inadequate number of viral copies  (<250 copies / mL). A negative result must be combined with clinical  observations, patient history, and  epidemiological information. If result is POSITIVE SARS-CoV-2 target nucleic acids are DETECTED. The SARS-CoV-2 RNA is generally detectable in upper and lower  respiratory specimens dur ing the acute phase of infection.  Positive  results are indicative of active infection with SARS-CoV-2.  Clinical  correlation with patient history and other diagnostic information is  necessary to determine patient infection status.  Positive results do  not rule out bacterial infection or co-infection with other viruses. If result is PRESUMPTIVE POSTIVE SARS-CoV-2 nucleic acids MAY BE PRESENT.   A presumptive positive result was obtained on the submitted specimen  and confirmed on repeat testing.  While 2019 novel coronavirus  (SARS-CoV-2) nucleic acids may be present in the submitted sample  additional confirmatory testing may be necessary for epidemiological  and / or clinical management purposes  to differentiate between  SARS-CoV-2 and other Sarbecovirus currently known to infect humans.  If clinically indicated additional testing with an alternate test  methodology [phone]) is advised. The SARS-CoV-2 RNA is generally  detectable in upper and lower respiratory sp ecimens during the acute  phase of infection. The expected result is Negative. Fact Sheet for Patients:  BoilerBrush.com.cy Fact Sheet for Healthcare Providers: https://pope.com/ This test is not yet approved or cleared by the [place] FDA and has been authorized for detection and/or diagnosis of SARS-CoV-2 by FDA under an Emergency Use Authorization (EUA).  This EUA will remain in effect (meaning this test can be used) for the duration of the COVID-19 declaration under Section 564(b)(1) of the Act, 21 U.S.C. section 360bbb-3(b)(1), unless the authorization is terminated or revoked sooner. Performed at [facility], [address] [place], [place] [address]   Surgical pcr  screen     Status: None   Collection Time: 10/13/18 12:08 AM   Specimen: Nasal Mucosa; Nasal Swab  Result Value Ref Range Status   MRSA, PCR NEGATIVE NEGATIVE Final   Staphylococcus aureus NEGATIVE NEGATIVE Final    Comment: (NOTE) The Xpert SA Assay (FDA approved for NASAL specimens in patients 8 years of age and older), is one component of a comprehensive surveillance program. It is not intended to diagnose infection nor to guide or monitor treatment.  Performed at [facility], [address], [place], [place] [address]     Radiology Reports Dg Chest 1 View  Result Date: 10/12/2018 CLINICAL DATA:  Fall. EXAM: CHEST  1 VIEW COMPARISON:  Radiograph Jun 25, 2018. FINDINGS: Stable cardiomediastinal silhouette. No pneumothorax or pleural effusion is noted. Both lungs are clear. The visualized skeletal structures are unremarkable. IMPRESSION: No active disease. Electronically Signed   By: Lupita Raider M.D.   On: 10/12/2018 17:01   Dg Pelvis 1-2 Views  Result Date: 10/12/2018 CLINICAL DATA:  Fall. EXAM: PELVIS - 1-2 VIEW COMPARISON:  None. FINDINGS: Minimally displaced fracture is seen involving the intertrochanteric region of the proximal left femur. Right hip is unremarkable. Pelvis appears normal. IMPRESSION: Minimally displaced intertrochanteric fracture of the proximal left femur. Electronically Signed   By: Lupita Raider M.D.   On: 10/12/2018 16:58   Ct Head Wo Contrast  Result Date: 10/12/2018 CLINICAL DATA:  Status post fall backwards.  Hit her head on a car. EXAM: CT HEAD WITHOUT CONTRAST CT CERVICAL SPINE WITHOUT CONTRAST TECHNIQUE: Multidetector CT imaging of the head and cervical spine was performed following the standard protocol without intravenous contrast. Multiplanar CT image reconstructions of the cervical spine were also generated. COMPARISON:  06/05/2012 FINDINGS: CT HEAD FINDINGS Brain: No evidence of acute infarction, hemorrhage, hydrocephalus, extra-axial  collection or mass lesion/mass effect. Old right basal ganglia lacunar infarct. Periventricular white matter low attenuation as can be seen with microvascular disease. Mild generalized cerebral atrophy. Vascular: No hyperdense vessel. Intracranial atherosclerotic disease. Skull: No osseous abnormality. Sinuses/Orbits: Visualized paranasal sinuses are clear. Visualized mastoid sinuses are clear. Visualized orbits demonstrate no focal abnormality. Other: None CT CERVICAL SPINE FINDINGS Alignment: Normal. Skull base and vertebrae: No acute fracture. No primary bone lesion or focal pathologic process. Soft tissues and spinal canal: No prevertebral fluid or swelling. No visible canal hematoma. Disc levels:  Disc spaces are maintained.  No foraminal stenosis. Upper chest: Lung apices are clear. Right-sided aortic arch with ligation of the left subclavian artery. Other: No fluid collection or hematoma. IMPRESSION: 1. No acute intracranial pathology. 2.  No acute osseous injury of the cervical spine. Electronically Signed   By: Elige Ko   On: 10/12/2018 17:17   Ct Cervical Spine Wo Contrast  Result Date: 10/12/2018 CLINICAL DATA:  Status post fall backwards.  Hit her head on a car. EXAM: CT HEAD WITHOUT CONTRAST CT CERVICAL SPINE WITHOUT CONTRAST TECHNIQUE: Multidetector CT imaging of the head and cervical spine was performed following the standard protocol without intravenous contrast. Multiplanar CT image reconstructions of the cervical spine were also generated. COMPARISON:  06/05/2012 FINDINGS: CT HEAD FINDINGS Brain: No evidence of acute infarction, hemorrhage, hydrocephalus, extra-axial collection or mass lesion/mass effect. Old right basal ganglia lacunar infarct. Periventricular white matter low attenuation as can be seen with microvascular disease. Mild generalized cerebral atrophy. Vascular: No hyperdense vessel. Intracranial atherosclerotic disease. Skull: No osseous abnormality. Sinuses/Orbits: Visualized  paranasal sinuses are clear. Visualized mastoid sinuses are clear. Visualized orbits demonstrate no focal abnormality. Other: None CT CERVICAL SPINE FINDINGS Alignment: Normal. Skull base and vertebrae: No acute fracture. No primary bone lesion or focal pathologic process. Soft tissues and spinal canal: No prevertebral fluid or swelling. No visible canal hematoma. Disc levels:  Disc spaces are maintained.  No foraminal stenosis. Upper chest: Lung apices are clear. Right-sided aortic arch with ligation of the left subclavian artery. Other: No fluid collection or hematoma. IMPRESSION: 1. No acute intracranial pathology. 2.  No acute osseous injury of the cervical spine. Electronically Signed   By: Elige Ko   On: 10/12/2018 17:17   US Abdomen Complete  Result Date: 10/05/2018 CLINICAL DATA:  Nausea and vomiting EXAM: ABDOMEN ULTRASOUND COMPLETE COMPARISON:  03/19/2017 renal ultrasound, CT 01/27/2017 FINDINGS: Gallbladder: Surgically absent Common bile duct: Diameter: 3.4 mm Liver: No focal lesion identified. Within normal limits in parenchymal echogenicity. Portal vein is patent on color Doppler imaging with normal direction of blood flow towards the liver. IVC: No abnormality visualized. Pancreas: Not well seen due to bowel gas Spleen: Size and appearance within normal limits. Right Kidney: Length: 8 cm. Echogenicity within normal limits. No mass or hydronephrosis visualized. Left Kidney: Length: 10.2 cm. Echogenicity within normal limits. No mass or hydronephrosis visualized. Abdominal aorta: No aneurysm visualized.  Maximum diameter 2.5 cm Other findings: None. IMPRESSION: 1. Status post cholecystectomy without biliary dilatation 2. Slight asymmetric size of kidneys right smaller than left but kidneys otherwise appear morphologically normal. Electronically Signed   By: Jasmine Pang M.D.   On: 10/05/2018 13:39   Dg Shoulder Left  Result Date: 10/12/2018 CLINICAL DATA:  Status post fall backwards. EXAM:  LEFT SHOULDER - 2+ VIEW COMPARISON:  None. FINDINGS: There is no fracture or dislocation. There is minimal osteoarthritis of the left glenohumeral joint. There are mild degenerative changes of the acromioclavicular joint. IMPRESSION: No acute osseous injury of the left shoulder. Electronically Signed   By: Elige Ko   On: 10/12/2018 16:59   Dg C-arm 1-60 Min  Result Date: 10/13/2018 CLINICAL DATA:  Intramedullary nail left femur. EXAM: LEFT FEMUR 2 VIEWS; DG C-ARM 1-60 MIN COMPARISON:  10/12/2018 FINDINGS: Interval fixation of patient's left femoral trochanteric fracture with intramedullary nail and associated screws bridging the fracture site into the femoral head. Hardware is intact with anatomic alignment over the fracture site. IMPRESSION: Fixation of left intertrochanteric fracture with hardware intact. Electronically Signed   By: Elberta Fortis M.D.   On: 10/13/2018 17:14   Dg Femur Min 2 Views Left  Result Date: 10/13/2018 CLINICAL DATA:  Intramedullary nail left femur. EXAM: LEFT FEMUR 2 VIEWS; DG C-ARM 1-60 MIN COMPARISON:  10/12/2018 FINDINGS: Interval fixation of patient's left femoral trochanteric fracture with intramedullary nail and associated screws bridging the fracture site into the femoral head. Hardware is intact with anatomic alignment over the fracture site. IMPRESSION: Fixation of left intertrochanteric fracture with hardware intact. Electronically Signed   By: Elberta Fortis M.D.   On: 10/13/2018 17:14   Dg Femur Min 2 Views Left  Result Date: 10/12/2018 CLINICAL DATA:  Fall. EXAM: LEFT FEMUR 2 VIEWS COMPARISON:  None. FINDINGS: Minimally displaced fracture is seen involving intertrochanteric region of proximal left femur. Vascular calcifications are noted. Remaining portion of femur is unremarkable. IMPRESSION: Minimally displaced intertrochanteric fracture of proximal left femur. Electronically Signed   By: Lupita Raider M.D.   On: 10/12/2018 16:59    Lab  Data:  CBC: Recent Labs  Lab 10/12/18 0944 10/12/18 1522 10/13/18 1019 10/14/18 0733  WBC 7.0 10.9* 9.4 13.5*  HGB 10.4* 9.7* 10.6* 9.3*  HCT 33.4* 32.4* 35.4* 31.0*  MCV 73* 74.1* 73.3* 73.8*  PLT 216 190 206 168   Basic Metabolic Panel: Recent Labs  Lab 10/12/18 0944 10/12/18 1522 10/13/18 1019 10/14/18 0733  NA 141 140 141 138  K 4.3 4.2 4.4 5.0  CL 104 108 105 105  CO2 23 25 27 26   GLUCOSE 128* 69* 84 183*  BUN 17 16  16 19  CREATININE 1.78* 1.81* 1.64* 1.89*  CALCIUM 8.9 8.5* 9.0 8.7*   GFR: Estimated Creatinine Clearance: 33.7 mL/min (A) (by C-G formula based on SCr of 1.89 mg/dL (H)). Liver Function Tests: Recent Labs  Lab 10/12/18 0944  AST 28  ALT 32  ALKPHOS 156*  BILITOT 0.2  PROT 5.9*  ALBUMIN 3.5*   No results for input(s): LIPASE, AMYLASE in the last 168 hours. No results for input(s): AMMONIA in the last 168 hours. Coagulation Profile: No results for input(s): INR, PROTIME in the last 168 hours. Cardiac Enzymes: No results for input(s): CKTOTAL, CKMB, CKMBINDEX, TROPONINI in the last 168 hours. BNP (last 3 results) No results for input(s): PROBNP in the last 8760 hours. HbA1C: Recent Labs    10/12/18 0944 10/13/18 1101  HGBA1C 5.3 5.4   CBG: Recent Labs  Lab 10/13/18 1606 10/13/18 1628 10/13/18 1732 10/13/18 2126 10/14/18 0619  GLUCAP 63* 118* 179* 252* 152*   Lipid Profile: No results for input(s): CHOL, HDL, LDLCALC, TRIG, CHOLHDL, LDLDIRECT in the last 72 hours. Thyroid Function Tests: No results for input(s): TSH, T4TOTAL, FREET4, T3FREE, THYROIDAB in the last 72 hours. Anemia Panel: Recent Labs    10/12/18 0944  FERRITIN 19  TIBC 228*  IRON 37   Urine analysis:    Component Value Date/Time   COLORURINE STRAW (A) 06/25/2018 2131   APPEARANCEUR HAZY (A) 06/25/2018 2131   APPEARANCEUR Cloudy (A) 07/17/2014 1455   LABSPEC 1.003 (L) 06/25/2018 2131   LABSPEC 1.012 12/29/2013 1210   PHURINE 6.0 06/25/2018 2131    GLUCOSEU NEGATIVE 06/25/2018 2131   GLUCOSEU >=500 12/29/2013 1210   HGBUR NEGATIVE 06/25/2018 2131   HGBUR negative 04/14/2010 1435   BILIRUBINUR Negative 07/04/2018 1605   BILIRUBINUR Negative 07/17/2014 1455   BILIRUBINUR Negative 12/29/2013 1210   KETONESUR NEGATIVE 06/25/2018 2131   PROTEINUR Negative 07/04/2018 1605   PROTEINUR NEGATIVE 06/25/2018 2131   UROBILINOGEN 0.2 07/04/2018 1605   UROBILINOGEN negative 04/14/2010 1435   NITRITE Negative 07/04/2018 1605   NITRITE NEGATIVE 06/25/2018 2131   LEUKOCYTESUR Moderate (2+) (A) 07/04/2018 1605   LEUKOCYTESUR NEGATIVE 06/25/2018 2131   LEUKOCYTESUR Negative 12/29/2013 1210     Caydee Talkington M.D. [facility] 10/14/2018, 10:37 AM  Pager: [phone] Between 7am to 7pm - call Pager - [phone]  After 7pm go to [email or web address] - password [email or web address]  Call night coverage person covering after 7pm

## 2018-10-14 NOTE — Evaluation (Signed)
Occupational Therapy Evaluation Patient Details Name: [name] MRN: [id number] DOB: 29-Nov-1970 Today's Date: 10/14/2018    History of Present Illness Pt is a 48 y.o. female admitted 10/12/18 after fall on steps and hitting head; pt sustained L femur fx. Head and cervical CT negative for acute injury. Pt s/p L intertrochanteric IM nail 9/10. PMH includes multiple sclerosis, CVA, CHF, HTN, DM2, CKD III, HLD, depression.   Clinical Impression   Pt uses a rollator or cane at times for ambulation and is modified independent in ADL and IADL at baseline. She lives with her 32 year old son, but could go to her mother's home which is more accessible and where she will have 24 hour assistance. Pt limited by significant L LE pain. She moves extremely slowly. Pt requires moderate assistance to stand and min assist to ambulate a few feet. She requires up to total assist for ADL. Recommending ST rehab. Will follow acutely.    Follow Up Recommendations  SNF;Supervision/Assistance - 24 hour    Equipment Recommendations  Wheelchair (measurements OT);Wheelchair cushion (measurements OT)    Recommendations for Other Services       Precautions / Restrictions Precautions Precautions: Fall Restrictions Weight Bearing Restrictions: Yes LLE Weight Bearing: Weight bearing as tolerated      Mobility Bed Mobility Overal bed mobility: Needs Assistance Bed Mobility: Supine to Sit     Supine to sit: Mod assist;+2 for physical assistance     General bed mobility comments: Assist for trunk elevation +2 assist helpful to manage LLE; pt limited by significant pain, required frequent cuing for technique  Transfers Overall transfer level: Needs assistance Equipment used: Rolling walker (2 wheeled) Transfers: Sit to/from Stand Sit to Stand: Mod assist         General transfer comment: ModA for trunk elevation, cues for correct hand placement but pt still pulling with BUEs on RW. Limited by  pain    Balance Overall balance assessment: Needs assistance   Sitting balance-Leahy Scale: Fair     Standing balance support: Bilateral upper extremity supported Standing balance-Leahy Scale: Poor                             ADL either performed or assessed with clinical judgement   ADL Overall ADL's : Needs assistance/impaired Eating/Feeding: Independent;Sitting   Grooming: Set up;Sitting   Upper Body Bathing: Minimal assistance;Sitting   Lower Body Bathing: Total assistance;Sit to/from stand   Upper Body Dressing : Minimal assistance;Sitting   Lower Body Dressing: Total assistance;Sit to/from stand   Toilet Transfer: Minimal assistance;Ambulation;BSC;RW   Toileting- Clothing Manipulation and Hygiene: Moderate assistance;Sit to/from stand       Functional mobility during ADLs: Minimal assistance;Rolling walker;Cueing for sequencing;+2 for safety/equipment       Vision Patient Visual Report: No change from baseline       Perception     Praxis      Pertinent Vitals/Pain Pain Assessment: Faces Faces Pain Scale: Hurts whole lot Pain Location: LLE Pain Descriptors / Indicators: Grimacing;Guarding;Operative site guarding;Moaning Pain Intervention(s): Premedicated before session;Monitored during session;Repositioned;Ice applied     Hand Dominance Right   Extremity/Trunk Assessment Upper Extremity Assessment Upper Extremity Assessment: Overall WFL for tasks assessed   Lower Extremity Assessment Lower Extremity Assessment: Defer to PT evaluation LLE Deficits / Details: Post-op limited by significant pain; able to perform partial LAQ and limited knee flexion; unable to perform SLR LLE: Unable to fully assess due to  pain LLE Coordination: decreased gross motor;decreased fine motor   Cervical / Trunk Assessment Cervical / Trunk Assessment: Normal   Communication Communication Communication: No difficulties   Cognition Arousal/Alertness:  Awake/alert Behavior During Therapy: WFL for tasks assessed/performed Overall Cognitive Status: Within Functional Limits for tasks assessed                                     General Comments       Exercises     Shoulder Instructions      Home Living Family/patient expects to be discharged to:: Private residence Living Arrangements: Children Available Help at Discharge: Family;Available PRN/intermittently Type of Home: House Home Access: Stairs to enter [employer] of Steps: 3 Entrance Stairs-Rails: Right;Left;Can reach both Home Layout: One level     Bathroom Shower/Tub: Teacher, early years/pre: Handicapped height Bathroom Accessibility: Yes   Home Equipment: Clinical cytogeneticist - 4 wheels;Cane - single point   Additional Comments: Likely to d/c to mother's home, home set-up for mother's home. Pt's home has 10 steps to enter; lives with 11 y.o. son who works during day and takes classes at night (pt reports he is her caregiver)      Prior Functioning/Environment Level of Independence: Needs assistance  Gait / Transfers Assistance Needed: Pt changes between rollator and cane, most recently using rollator. ADL's / Homemaking Assistance Needed: indep with ADL, cooking, and med mgt; son drives and assists with groceries, housework, etc.   Comments: Pt reported a lot of recent falls. Currently has HHPT        OT Problem List: Decreased strength;Decreased activity tolerance;Impaired balance (sitting and/or standing);Decreased knowledge of use of DME or AE;Pain      OT Treatment/Interventions: Self-care/ADL training;DME and/or AE instruction;Therapeutic activities;Patient/family education;Balance training    OT Goals(Current goals can be found in the care plan section) Acute Rehab OT Goals Patient Stated Goal: Unsure if d/c to mom's home with [facility] or post-acute rehab at SNF OT Goal Formulation: With patient Time For Goal Achievement:  10/28/18 Potential to Achieve Goals: Good ADL Goals Pt Will Perform Lower Body Bathing: with supervision;sit to/from stand;with adaptive equipment Pt Will Perform Lower Body Dressing: with supervision;sit to/from stand;with adaptive equipment Pt Will Transfer to Toilet: with supervision;ambulating;bedside commode Pt Will Perform Toileting - Clothing Manipulation and hygiene: with supervision;sit to/from stand Additional ADL Goal #1: Pt will perform bed mobility with min assist in preparation for ADL.  OT Frequency: Min 2X/week   Barriers to D/C:            Co-evaluation PT/OT/SLP Co-Evaluation/Treatment: Yes Reason for Co-Treatment: For patient/therapist safety PT goals addressed during session: Mobility/safety with mobility;Balance;Proper use of DME OT goals addressed during session: ADL's and self-care      AM-PAC OT "6 Clicks" Daily Activity     Outcome Measure Help from another person eating meals?: None Help from another person taking care of personal grooming?: A Little Help from another person toileting, which includes using toliet, bedpan, or urinal?: A Lot Help from another person bathing (including washing, rinsing, drying)?: A Lot Help from another person to put on and taking off regular upper body clothing?: A Little Help from another person to put on and taking off regular lower body clothing?: Total 6 Click Score: 15   End of Session Equipment Utilized During Treatment: Gait belt;Rolling walker Nurse Communication: Mobility status  Activity Tolerance: Patient limited by pain Patient  left: in chair;with call bell/phone within reach;with chair alarm set  OT Visit Diagnosis: Unsteadiness on feet (R26.81);Other abnormalities of gait and mobility (R26.89);Pain;Muscle weakness (generalized) (M62.81)                Time: 1110-1140 OT Time Calculation (min): 30 min Charges:  OT General Charges $OT Visit: 1 Visit OT Evaluation $OT Eval Moderate Complexity: 1  Mod  Nestor Lewandowsky, OTR/L Acute Rehabilitation Services Pager: [phone] Office: [phone]  [name] 10/14/2018, 12:48 PM

## 2018-10-14 NOTE — Progress Notes (Signed)
Subjective: 1 Day Post-Op Procedure(s) (LRB): INTRAMEDULLARY (IM) NAIL INTERTROCHANTRIC (Left) Patient reports pain as moderate.    Objective: Vital signs in last 24 hours: Temp:  [97 F (36.1 C)-98.6 F (37 C)] 97.3 F (36.3 C) (09/11 0516) Pulse Rate:  [75-92] 79 (09/11 0516) Resp:  [8-18] 18 (09/11 0516) BP: (121-162)/(78-93) 132/84 (09/11 0516) SpO2:  [94 %-100 %] 97 % (09/11 0516)  Intake/Output from previous day: 09/10 0701 - 09/11 0700 In: 715 [I.V.:515; IV Piggyback:200] Out: 1285 [Urine:1200; Blood:85] Intake/Output this shift: No intake/output data recorded.  Recent Labs    10/12/18 0944 10/12/18 1522 10/13/18 1019  HGB 10.4* 9.7* 10.6*   Recent Labs    10/12/18 1522 10/13/18 1019  WBC 10.9* 9.4  RBC 4.37 4.83  HCT 32.4* 35.4*  PLT 190 206   Recent Labs    10/12/18 1522 10/13/18 1019  NA 140 141  K 4.2 4.4  CL 108 105  CO2 25 27  BUN 16 16  CREATININE 1.81* 1.64*  GLUCOSE 69* 84  CALCIUM 8.5* 9.0   No results for input(s): LABPT, INR in the last 72 hours.  Neurologically intact Neurovascular intact Sensation intact distally Intact pulses distally Dorsiflexion/Plantar flexion intact Incision: scant drainage No cellulitis present Compartment soft   Assessment/Plan: 1 Day Post-Op Procedure(s) (LRB): INTRAMEDULLARY (IM) NAIL INTERTROCHANTRIC (Left) Up with therapy  WBAT LLE Awaiting labs DVT ppx- ASA 325 mg x 6 weeks in addition to restarting plavix at 10am today F/u with Dr. Erlinda Hong in 2 weeks for suture removal      [name] 10/14/2018, 7:41 AM

## 2018-10-14 NOTE — Evaluation (Addendum)
Physical Therapy Evaluation Patient Details Name: [name] MRN: [id number] DOB: [birth year] Today's Date: 10/14/2018   History of Present Illness  Pt is a 48 y.o. female admitted 10/12/18 after fall on steps and hitting head; pt sustained L femur fx. Head and cervical CT negative for acute injury. Pt s/p L intertrochanteric IM nail 9/10. PMH includes multiple sclerosis, CVA, CHF, HTN, DM2, CKD III, HLD, depression.    Clinical Impression  Pt presents with an overall decrease in functional mobility secondary to above. PTA, pt mod indep with rollator, lives with son who assists with household tasks and driving. Educ on precautions, positioning, and importance of mobility. Today, pt able to initiate transfer training and take a few steps with RW, requiring up to [place]. Pt limited by significant LLE pain. Based on current functional mobility, recommend SNF-level therapies to maximize functional mobility and independence prior to return home. Pt undecided on SNF due to [place]; if to return home, will require increased assist from mother, [facility] services and wheelchair.    Follow Up Recommendations SNF;Supervision for mobility/OOB(if declining SNF, will need HHPT and DME)    Equipment Recommendations  Rolling walker with 5" wheels;Wheelchair (measurements PT);Wheelchair cushion (measurements PT)    Recommendations for Other Services       Precautions / Restrictions Precautions Precautions: Fall Restrictions Weight Bearing Restrictions: Yes LLE Weight Bearing: Weight bearing as tolerated      Mobility  Bed Mobility Overal bed mobility: Needs Assistance Bed Mobility: Supine to Sit     Supine to sit: Mod assist;+2 for physical assistance     General bed mobility comments: Assist for trunk elevation +2 assist helpful to manage LLE; pt limited by significant pain, required frequent cuing for technique  Transfers Overall transfer level: Needs assistance Equipment used: Rolling walker  (2 wheeled) Transfers: Sit to/from Stand Sit to Stand: Mod assist         General transfer comment: ModA for trunk elevation, cues for correct hand placement but pt still pulling with BUEs on RW. Limited by pain  Ambulation/Gait Ambulation/Gait assistance: Min assist;+2 safety/equipment Gait Distance (Feet): 4 Feet Assistive device: Rolling walker (2 wheeled) Gait Pattern/deviations: Step-to pattern;Decreased weight shift to left;Antalgic Gait velocity: Decreased Gait velocity interpretation: <1.31 ft/sec, indicative of household ambulator General Gait Details: Very slow steps forward with RW, pt requiring repeated cues for technique and max encouragement, difficulty accepting weight onto LLE in order to take complete step with RLE. Required pulling up of recliner needing to sit due to pain  Stairs            Wheelchair Mobility    Modified Rankin (Stroke Patients Only)       Balance Overall balance assessment: Needs assistance   Sitting balance-Leahy Scale: Good       Standing balance-Leahy Scale: Poor                               Pertinent Vitals/Pain Pain Assessment: Faces Faces Pain Scale: Hurts whole lot Pain Location: LLE Pain Descriptors / Indicators: Grimacing;Guarding;Operative site guarding;Moaning Pain Intervention(s): Monitored during session;Premedicated before session    Home Living Family/patient expects to be discharged to:: Private residence Living Arrangements: Children Available Help at Discharge: Family;Available PRN/intermittently Type of Home: House Home Access: Stairs to enter Entrance Stairs-Rails: Right;Left;Can reach both Entrance Stairs-Number of Steps: 3 Home Layout: One level Home Equipment: Shower seat Additional Comments: Likely to d/c to mother's home, home set-up  for mother's home. Pt's home has 10 steps to enter; lives with 61 y.o. son who works during day and takes classes at night (pt reports he is her  caregiver)    Prior Function Level of Independence: Needs assistance   Gait / Transfers Assistance Needed: Pt changes between rollator and cane, most recently using rollator.  ADL's / Homemaking Assistance Needed: indep with ADL, cooking, and med mgt; son drives and assists with groceries, housework, etc.  Comments: Pt reported a lot of recent falls. Currently has HHPT     Hand Dominance   Dominant Hand: Right    Extremity/Trunk Assessment   Upper Extremity Assessment Upper Extremity Assessment: Overall WFL for tasks assessed    Lower Extremity Assessment Lower Extremity Assessment: LLE deficits/detail LLE Deficits / Details: Post-op limited by significant pain; able to perform partial LAQ and limited knee flexion; unable to perform SLR LLE: Unable to fully assess due to pain LLE Coordination: decreased gross motor;decreased fine motor       Communication   Communication: No difficulties  Cognition Arousal/Alertness: Awake/alert Behavior During Therapy: WFL for tasks assessed/performed Overall Cognitive Status: Within Functional Limits for tasks assessed                                        General Comments      Exercises     Assessment/Plan    PT Assessment Patient needs continued PT services  PT Problem List Decreased strength;Decreased range of motion;Decreased activity tolerance;Decreased balance;Decreased mobility;Decreased knowledge of use of DME;Pain       PT Treatment Interventions DME instruction;Gait training;Stair training;Functional mobility training;Therapeutic activities;Therapeutic exercise;Balance training;Patient/family education;Wheelchair mobility training    PT Goals (Current goals can be found in the Care Plan section)  Acute Rehab PT Goals Patient Stated Goal: Unsure if d/c to mom's home with [facility] or post-acute rehab at SNF PT Goal Formulation: With patient Time For Goal Achievement: 10/28/18 Potential to Achieve Goals:  Good    Frequency Min 5X/week   Barriers to discharge        Co-evaluation PT/OT/SLP Co-Evaluation/Treatment: Yes Reason for Co-Treatment: For patient/therapist safety;To address functional/ADL transfers(limited by pain) PT goals addressed during session: Mobility/safety with mobility;Balance;Proper use of DME         AM-PAC PT "6 Clicks" Mobility  Outcome Measure Help needed turning from your back to your side while in a flat bed without using bedrails?: A Lot Help needed moving from lying on your back to sitting on the side of a flat bed without using bedrails?: A Lot Help needed moving to and from a bed to a chair (including a wheelchair)?: A Lot Help needed standing up from a chair using your arms (e.g., wheelchair or bedside chair)?: A Lot Help needed to walk in hospital room?: A Lot Help needed climbing 3-5 steps with a railing? : A Lot 6 Click Score: 12    End of Session Equipment Utilized During Treatment: Gait belt Activity Tolerance: Patient tolerated treatment well;Patient limited by pain Patient left: in chair;with call bell/phone within reach Nurse Communication: Mobility status PT Visit Diagnosis: Other abnormalities of gait and mobility (R26.89);Pain Pain - Right/Left: Left Pain - part of body: Leg;Hip    Time: 4008-6761 PT Time Calculation (min) (ACUTE ONLY): 32 min   Charges:   PT Evaluation $PT Eval Moderate Complexity: 1 Mod     Mabeline Caras, PT, DPT Acute Rehabilitation  Services  Pager [phone] Office [place] 10/14/2018, 12:26 PM

## 2018-10-15 LAB — BASIC METABOLIC PANEL
Anion gap: 9 (ref 5–15)
BUN: 23 mg/dL — ABNORMAL HIGH (ref 6–20)
CO2: 24 mmol/L (ref 22–32)
Calcium: 8.4 mg/dL — ABNORMAL LOW (ref 8.9–10.3)
Chloride: 111 mmol/L (ref 98–111)
Creatinine, Ser: 2.04 mg/dL — ABNORMAL HIGH (ref 0.44–1.00)
GFR calc Af Amer: 32 mL/min — ABNORMAL LOW (ref >60)
GFR calc non Af Amer: 28 mL/min — ABNORMAL LOW (ref >60)
Glucose, Bld: 103 mg/dL — ABNORMAL HIGH (ref 70–99)
Potassium: 4.4 mmol/L (ref 3.5–5.1)
Sodium: 144 mmol/L (ref 135–145)

## 2018-10-15 LAB — TYPE AND SCREEN
ABO/RH(D): O NEG
Antibody Screen: NEGATIVE

## 2018-10-15 LAB — GLUCOSE, CAPILLARY
Glucose-Capillary: 70 mg/dL (ref 70–99)
Glucose-Capillary: 54 mg/dL — ABNORMAL LOW (ref 70–99)
Glucose-Capillary: 66 mg/dL — ABNORMAL LOW (ref 70–99)
Glucose-Capillary: 138 mg/dL — ABNORMAL HIGH (ref 70–99)

## 2018-10-15 LAB — CBC
HCT: 25.5 % — ABNORMAL LOW (ref 36.0–46.0)
Hemoglobin: 7.9 g/dL — ABNORMAL LOW (ref 12.0–15.0)
MCH: 22.8 pg — ABNORMAL LOW (ref 26.0–34.0)
MCHC: 31 g/dL (ref 30.0–36.0)
MCV: 73.7 fL — ABNORMAL LOW (ref 80.0–100.0)
Platelets: 144 10*3/uL — ABNORMAL LOW (ref 150–400)
RBC: 3.46 MIL/uL — ABNORMAL LOW (ref 3.87–5.11)
RDW: 18 % — ABNORMAL HIGH (ref 11.5–15.5)
WBC: 10.9 10*3/uL — ABNORMAL HIGH (ref 4.0–10.5)
nRBC: 0 % (ref 0.0–0.2)

## 2018-10-15 LAB — ABO/RH: ABO/RH(D): O NEG

## 2018-10-15 LAB — PREPARE RBC (CROSSMATCH)

## 2018-10-15 MED ORDER — SODIUM CHLORIDE 0.9% IV SOLUTION: Freq: Once | INTRAVENOUS | Status: DC

## 2018-10-15 NOTE — Progress Notes (Signed)
[facility]                                                                              Patient Demographics  [address] [name], is a 48 y.o. female, DOB - 1970-12-02, ZOX:[id number]  Admit date - 10/12/2018   Admitting Physician Anselm Jungling, DO  Outpatient Primary MD for the patient is Fisher, Demetrios Isaacs, MD  Outpatient specialists:   LOS - 3  days   Medical records reviewed and are as summarized below:    Chief Complaint  Patient presents with   Fall       Brief summary   Patient is a 48 year old female with history of CVA, MS, chronic systolic CHF, hypertension, diabetes type 2, stage III CKD, hyperlipidemia, depression presented to ED after mechanical fall.  Patient reports she has baseline unsteady gait, has frequent falls.  On the day of admission, she was climbing up one step of stairs, lost her balance and fell on her left side and also hit her head. CT head, C-spine negative. Left femur x-ray showed minimally displaced intertrochanteric fracture of the proximal left femur.   Subjective:   [address] [name] was seen and examined today.  Reports left hip pain, denies any fever, chills or shortness of breath .  Assessment & Plan       Closed nondisplaced intertrochanteric fracture of left femur (HCC) secondary to mechanical fall -Orthopedics was consulted, patient underwent ORIF with intramedullary implant on 10/13/2018, - pain control, DVT prophylaxis per orthopedics -Per Ortho, DVT prophylaxis aspirin 325 mg x 6 weeks in addition to restarting Plavix at 10 AM -[facility] LLE, follow-up with Dr. Roda Shutters in 2 weeks -PT consulted, recommendation for SNF placement   History of CVA -Restart Plavix, continue statin    Depression with anxiety -Continue Wellbutrin, Effexor, Xanax prn  Diabetes mellitus type 2, NIDDM, with neuropathy -Continue sliding scale insulin, hemoglobin A1c 5.4   Hyperlipidemia -Continue Crest  Hypothyroidism -Continue  levothyroxine  Iron deficiency anemia, chronic gastritis.  Acute blood loss anemia -Continue PPI -Baseline hemoglobin ~10 -Follow CBC, globin is 7.9 today, continue to monitor  Hypertension Continue metoprolol, Imdur, losartan  Recent diagnosis of H. pylori gastritis -Patient has been following with her PCP closely, had chronic epigastric pain, nausea and vomiting.  Patient was found to have H. pylori gastritis -Ultrasound abdomen was unremarkable. -Patient was started on amoxicillin 1000 mg twice daily and Biaxin 500 mg twice a day for 14 days.  -Continue PPI Patient was seen by her PCP on 9/9, was recommended to finish the regimen and repeat H. pylori breath test after she has completed the therapy.  Stop date 10/17/2018  History of IBS -Placed on stool softeners, follow closely  Acute on CKD stage III -Baseline creatinine appears to be 1.5-1.7 -Presented with creatinine of 1.8-> 1.6-> 1.8 today  -Continue gentle IV fluid hydration, until tolerating p.o.  Leukocytosis Likely stress demargination, no fevers or chills or other complaints, follow CBC Chest x-ray 9/9 showed no pneumonia.  Code Status: Full code DVT Prophylaxis: Per orthopedics Family Communication: Discussed with patient  Disposition Plan: Will need SNF placement  Procedures:  Open treatment of intertrochanteric fracture with intramedullary implant.  10/13/2018  Consultants:   Orthopedics  Antimicrobials:   Anti-infectives (From admission, onward)   Start     Dose/Rate Route Frequency Ordered Stop   10/13/18 2300  ceFAZolin (ANCEF) IVPB 2g/100 mL premix     2 g 200 mL/hr over 30 Minutes Intravenous Every 6 hours 10/13/18 1710 10/14/18 1055   10/13/18 1200  ceFAZolin (ANCEF) IVPB 2g/100 mL premix     2 g 200 mL/hr over 30 Minutes Intravenous To Short Stay 10/12/18 2324 10/13/18 1458   10/13/18 1200  amoxicillin (AMOXIL) capsule 1,000 mg     1,000 mg Oral 2 times daily 10/13/18 1111 10/17/18 2159    10/13/18 1200  clarithromycin (BIAXIN) tablet 500 mg     500 mg Oral 2 times daily 10/13/18 1111 10/17/18 2159         Medications  Scheduled Meds:  sodium chloride   Intravenous Once   amoxicillin  1,000 mg Oral BID   aspirin EC  325 mg Oral Daily   buPROPion  150 mg Oral Daily   clarithromycin  500 mg Oral BID   clopidogrel  75 mg Oral Daily   docusate sodium  100 mg Oral BID   gabapentin  300 mg Oral BID   insulin aspart  0-5 Units Subcutaneous QHS   insulin aspart  0-9 Units Subcutaneous TID WC   isosorbide mononitrate  30 mg Oral Daily   levothyroxine  25 mcg Oral QAC breakfast   losartan  25 mg Oral Daily   metoprolol succinate  12.5 mg Oral Daily   pantoprazole  40 mg Oral BID   rosuvastatin  40 mg Oral Daily   venlafaxine XR  75 mg Oral Q breakfast   Continuous Infusions:  sodium chloride 10 mL/hr (10/15/18 1517)   methocarbamol (ROBAXIN) IV     PRN Meds:.acetaminophen, ALPRAZolam, alum & mag hydroxide-simeth, diphenoxylate-atropine, HYDROcodone-acetaminophen, HYDROcodone-acetaminophen, magnesium citrate, menthol-cetylpyridinium **OR** phenol, methocarbamol **OR** methocarbamol (ROBAXIN) IV, morphine injection, ondansetron **OR** ondansetron (ZOFRAN) IV, polyethylene glycol, sorbitol    Objective:   Vitals:   10/15/18 0742 10/15/18 1100 10/15/18 1348 10/15/18 1440  BP: (!) 171/83 (!) 165/86 131/86 (!) 149/76  Pulse: 79 80 93 94  Resp: 16 18 16 16   Temp: 98.8 F (37.1 C) 99 F (37.2 C) 98.4 F (36.9 C) 98.4 F (36.9 C)  TempSrc: Oral Oral Oral Oral  SpO2: 100% 100% 99% 100%    Intake/Output Summary (Last 24 hours) at 10/15/2018 1609 Last data filed at 10/15/2018 1130 Gross per 24 hour  Intake 2690.24 ml  Output 4301 ml  Net -1610.76 ml     Wt Readings from Last 3 Encounters:  10/12/18 69.5 kg  08/31/18 72.1 kg  08/16/18 72.6 kg   Physical Exam  Awake Alert, Oriented X 3, No new F.N deficits, Normal affect Symmetrical Chest  wall movement, Good air movement bilaterally, CTAB RRR,No Gallops,Rubs or new Murmurs, No Parasternal Heave +ve B.Sounds, Abd Soft, No tenderness, No rebound - guarding or rigidity. No Cyanosis, Clubbing or edema, No new Rash or bruise     Data Reviewed:  I have personally reviewed following labs and imaging studies  Micro Results Recent Results (from the past 240 hour(s))  SARS Coronavirus 2 [facility] order, Performed in [facility] hospital lab) Nasopharyngeal Nasopharyngeal Swab     Status: None   Collection Time: 10/12/18  6:00 PM   Specimen: Nasopharyngeal Swab  Result Value Ref Range Status   SARS Coronavirus  2 NEGATIVE NEGATIVE Final    Comment: (NOTE) If result is NEGATIVE SARS-CoV-2 target nucleic acids are NOT DETECTED. The SARS-CoV-2 RNA is generally detectable in upper and lower  respiratory specimens during the acute phase of infection. The lowest  concentration of SARS-CoV-2 viral copies this assay can detect is 250  copies / mL. A negative result does not preclude SARS-CoV-2 infection  and should not be used as the sole basis for treatment or other  patient management decisions.  A negative result may occur with  improper specimen collection / handling, submission of specimen other  than nasopharyngeal swab, presence of viral mutation(s) within the  areas targeted by this assay, and inadequate number of viral copies  (<250 copies / mL). A negative result must be combined with clinical  observations, patient history, and epidemiological information. If result is POSITIVE SARS-CoV-2 target nucleic acids are DETECTED. The SARS-CoV-2 RNA is generally detectable in upper and lower  respiratory specimens dur ing the acute phase of infection.  Positive  results are indicative of active infection with SARS-CoV-2.  Clinical  correlation with patient history and other diagnostic information is  necessary to determine patient infection status.  Positive results do  not rule  out bacterial infection or co-infection with other viruses. If result is PRESUMPTIVE POSTIVE SARS-CoV-2 nucleic acids MAY BE PRESENT.   A presumptive positive result was obtained on the submitted specimen  and confirmed on repeat testing.  While 2019 novel coronavirus  (SARS-CoV-2) nucleic acids may be present in the submitted sample  additional confirmatory testing may be necessary for epidemiological  and / or clinical management purposes  to differentiate between  SARS-CoV-2 and other Sarbecovirus currently known to infect humans.  If clinically indicated additional testing with an alternate test  methodology [phone]) is advised. The SARS-CoV-2 RNA is generally  detectable in upper and lower respiratory sp ecimens during the acute  phase of infection. The expected result is Negative. Fact Sheet for Patients:  BoilerBrush.com.cy Fact Sheet for Healthcare Providers: https://pope.com/ This test is not yet approved or cleared by the [place] FDA and has been authorized for detection and/or diagnosis of SARS-CoV-2 by FDA under an Emergency Use Authorization (EUA).  This EUA will remain in effect (meaning this test can be used) for the duration of the COVID-19 declaration under Section 564(b)(1) of the Act, 21 U.S.C. section 360bbb-3(b)(1), unless the authorization is terminated or revoked sooner. Performed at [facility], [address] [place], [place] [address]   Surgical pcr screen     Status: None   Collection Time: 10/13/18 12:08 AM   Specimen: Nasal Mucosa; Nasal Swab  Result Value Ref Range Status   MRSA, PCR NEGATIVE NEGATIVE Final   Staphylococcus aureus NEGATIVE NEGATIVE Final    Comment: (NOTE) The Xpert SA Assay (FDA approved for NASAL specimens in patients 104 years of age and older), is one component of a comprehensive surveillance program. It is not intended to diagnose infection nor to guide or monitor  treatment. Performed at [facility], [address], [place], [place] [address]     Radiology Reports Dg Chest 1 View  Result Date: 10/12/2018 CLINICAL DATA:  Fall. EXAM: CHEST  1 VIEW COMPARISON:  Radiograph Jun 25, 2018. FINDINGS: Stable cardiomediastinal silhouette. No pneumothorax or pleural effusion is noted. Both lungs are clear. The visualized skeletal structures are unremarkable. IMPRESSION: No active disease. Electronically Signed   By: Lupita Raider M.D.   On: 10/12/2018 17:01  Dg Pelvis 1-2 Views  Result Date: 10/12/2018 CLINICAL DATA:  Fall. EXAM: PELVIS - 1-2 VIEW COMPARISON:  None. FINDINGS: Minimally displaced fracture is seen involving the intertrochanteric region of the proximal left femur. Right hip is unremarkable. Pelvis appears normal. IMPRESSION: Minimally displaced intertrochanteric fracture of the proximal left femur. Electronically Signed   By: Lupita Raider M.D.   On: 10/12/2018 16:58   Ct Head Wo Contrast  Result Date: 10/12/2018 CLINICAL DATA:  Status post fall backwards.  Hit her head on a car. EXAM: CT HEAD WITHOUT CONTRAST CT CERVICAL SPINE WITHOUT CONTRAST TECHNIQUE: Multidetector CT imaging of the head and cervical spine was performed following the standard protocol without intravenous contrast. Multiplanar CT image reconstructions of the cervical spine were also generated. COMPARISON:  06/05/2012 FINDINGS: CT HEAD FINDINGS Brain: No evidence of acute infarction, hemorrhage, hydrocephalus, extra-axial collection or mass lesion/mass effect. Old right basal ganglia lacunar infarct. Periventricular white matter low attenuation as can be seen with microvascular disease. Mild generalized cerebral atrophy. Vascular: No hyperdense vessel. Intracranial atherosclerotic disease. Skull: No osseous abnormality. Sinuses/Orbits: Visualized paranasal sinuses are clear. Visualized mastoid sinuses are clear. Visualized orbits demonstrate no focal abnormality. Other: None CT  CERVICAL SPINE FINDINGS Alignment: Normal. Skull base and vertebrae: No acute fracture. No primary bone lesion or focal pathologic process. Soft tissues and spinal canal: No prevertebral fluid or swelling. No visible canal hematoma. Disc levels:  Disc spaces are maintained.  No foraminal stenosis. Upper chest: Lung apices are clear. Right-sided aortic arch with ligation of the left subclavian artery. Other: No fluid collection or hematoma. IMPRESSION: 1. No acute intracranial pathology. 2.  No acute osseous injury of the cervical spine. Electronically Signed   By: Elige Ko   On: 10/12/2018 17:17   Ct Cervical Spine Wo Contrast  Result Date: 10/12/2018 CLINICAL DATA:  Status post fall backwards.  Hit her head on a car. EXAM: CT HEAD WITHOUT CONTRAST CT CERVICAL SPINE WITHOUT CONTRAST TECHNIQUE: Multidetector CT imaging of the head and cervical spine was performed following the standard protocol without intravenous contrast. Multiplanar CT image reconstructions of the cervical spine were also generated. COMPARISON:  06/05/2012 FINDINGS: CT HEAD FINDINGS Brain: No evidence of acute infarction, hemorrhage, hydrocephalus, extra-axial collection or mass lesion/mass effect. Old right basal ganglia lacunar infarct. Periventricular white matter low attenuation as can be seen with microvascular disease. Mild generalized cerebral atrophy. Vascular: No hyperdense vessel. Intracranial atherosclerotic disease. Skull: No osseous abnormality. Sinuses/Orbits: Visualized paranasal sinuses are clear. Visualized mastoid sinuses are clear. Visualized orbits demonstrate no focal abnormality. Other: None CT CERVICAL SPINE FINDINGS Alignment: Normal. Skull base and vertebrae: No acute fracture. No primary bone lesion or focal pathologic process. Soft tissues and spinal canal: No prevertebral fluid or swelling. No visible canal hematoma. Disc levels:  Disc spaces are maintained.  No foraminal stenosis. Upper chest: Lung apices are  clear. Right-sided aortic arch with ligation of the left subclavian artery. Other: No fluid collection or hematoma. IMPRESSION: 1. No acute intracranial pathology. 2.  No acute osseous injury of the cervical spine. Electronically Signed   By: Elige Ko   On: 10/12/2018 17:17   US Abdomen Complete  Result Date: 10/05/2018 CLINICAL DATA:  Nausea and vomiting EXAM: ABDOMEN ULTRASOUND COMPLETE COMPARISON:  03/19/2017 renal ultrasound, CT 01/27/2017 FINDINGS: Gallbladder: Surgically absent Common bile duct: Diameter: 3.4 mm Liver: No focal lesion identified. Within normal limits in parenchymal echogenicity. Portal vein is patent on color Doppler imaging with normal direction of blood flow towards  the liver. IVC: No abnormality visualized. Pancreas: Not well seen due to bowel gas Spleen: Size and appearance within normal limits. Right Kidney: Length: 8 cm. Echogenicity within normal limits. No mass or hydronephrosis visualized. Left Kidney: Length: 10.2 cm. Echogenicity within normal limits. No mass or hydronephrosis visualized. Abdominal aorta: No aneurysm visualized.  Maximum diameter 2.5 cm Other findings: None. IMPRESSION: 1. Status post cholecystectomy without biliary dilatation 2. Slight asymmetric size of kidneys right smaller than left but kidneys otherwise appear morphologically normal. Electronically Signed   By: Jasmine Pang M.D.   On: 10/05/2018 13:39   Dg Shoulder Left  Result Date: 10/12/2018 CLINICAL DATA:  Status post fall backwards. EXAM: LEFT SHOULDER - 2+ VIEW COMPARISON:  None. FINDINGS: There is no fracture or dislocation. There is minimal osteoarthritis of the left glenohumeral joint. There are mild degenerative changes of the acromioclavicular joint. IMPRESSION: No acute osseous injury of the left shoulder. Electronically Signed   By: Elige Ko   On: 10/12/2018 16:59   Dg C-arm 1-60 Min  Result Date: 10/13/2018 CLINICAL DATA:  Intramedullary nail left femur. EXAM: LEFT FEMUR 2  VIEWS; DG C-ARM 1-60 MIN COMPARISON:  10/12/2018 FINDINGS: Interval fixation of patient's left femoral trochanteric fracture with intramedullary nail and associated screws bridging the fracture site into the femoral head. Hardware is intact with anatomic alignment over the fracture site. IMPRESSION: Fixation of left intertrochanteric fracture with hardware intact. Electronically Signed   By: Elberta Fortis M.D.   On: 10/13/2018 17:14   Dg Femur Min 2 Views Left  Result Date: 10/13/2018 CLINICAL DATA:  Intramedullary nail left femur. EXAM: LEFT FEMUR 2 VIEWS; DG C-ARM 1-60 MIN COMPARISON:  10/12/2018 FINDINGS: Interval fixation of patient's left femoral trochanteric fracture with intramedullary nail and associated screws bridging the fracture site into the femoral head. Hardware is intact with anatomic alignment over the fracture site. IMPRESSION: Fixation of left intertrochanteric fracture with hardware intact. Electronically Signed   By: Elberta Fortis M.D.   On: 10/13/2018 17:14   Dg Femur Min 2 Views Left  Result Date: 10/12/2018 CLINICAL DATA:  Fall. EXAM: LEFT FEMUR 2 VIEWS COMPARISON:  None. FINDINGS: Minimally displaced fracture is seen involving intertrochanteric region of proximal left femur. Vascular calcifications are noted. Remaining portion of femur is unremarkable. IMPRESSION: Minimally displaced intertrochanteric fracture of proximal left femur. Electronically Signed   By: Lupita Raider M.D.   On: 10/12/2018 16:59    Lab Data:  CBC: Recent Labs  Lab 10/12/18 0944 10/12/18 1522 10/13/18 1019 10/14/18 0733 10/15/18 0556  WBC 7.0 10.9* 9.4 13.5* 10.9*  HGB 10.4* 9.7* 10.6* 9.3* 7.9*  HCT 33.4* 32.4* 35.4* 31.0* 25.5*  MCV 73* 74.1* 73.3* 73.8* 73.7*  PLT 216 190 206 168 144*   Basic Metabolic Panel: Recent Labs  Lab 10/12/18 0944 10/12/18 1522 10/13/18 1019 10/14/18 0733 10/15/18 0556  NA 141 140 141 138 144  K 4.3 4.2 4.4 5.0 4.4  CL 104 108 105 105 111  CO2 23 25 27  26 24   GLUCOSE 128* 69* 84 183* 103*  BUN 17 16 16 19  23*  CREATININE 1.78* 1.81* 1.64* 1.89* 2.04*  CALCIUM 8.9 8.5* 9.0 8.7* 8.4*   GFR: Estimated Creatinine Clearance: 31.2 mL/min (A) (by C-G formula based on SCr of 2.04 mg/dL (H)). Liver Function Tests: Recent Labs  Lab 10/12/18 0944  AST 28  ALT 32  ALKPHOS 156*  BILITOT 0.2  PROT 5.9*  ALBUMIN 3.5*   No results for input(s):  LIPASE, AMYLASE in the last 168 hours. No results for input(s): AMMONIA in the last 168 hours. Coagulation Profile: No results for input(s): INR, PROTIME in the last 168 hours. Cardiac Enzymes: No results for input(s): CKTOTAL, CKMB, CKMBINDEX, TROPONINI in the last 168 hours. BNP (last 3 results) No results for input(s): PROBNP in the last 8760 hours. HbA1C: Recent Labs    10/13/18 1101  HGBA1C 5.4   CBG: Recent Labs  Lab 10/14/18 1134 10/14/18 1637 10/14/18 2020 10/15/18 0641 10/15/18 1138  GLUCAP 250* 190* 257* 70 54*   Lipid Profile: No results for input(s): CHOL, HDL, LDLCALC, TRIG, CHOLHDL, LDLDIRECT in the last 72 hours. Thyroid Function Tests: No results for input(s): TSH, T4TOTAL, FREET4, T3FREE, THYROIDAB in the last 72 hours. Anemia Panel: No results for input(s): VITAMINB12, FOLATE, FERRITIN, TIBC, IRON, RETICCTPCT in the last 72 hours. Urine analysis:    Component Value Date/Time   COLORURINE STRAW (A) 06/25/2018 2131   APPEARANCEUR HAZY (A) 06/25/2018 2131   APPEARANCEUR Cloudy (A) 07/17/2014 1455   LABSPEC 1.003 (L) 06/25/2018 2131   LABSPEC 1.012 12/29/2013 1210   PHURINE 6.0 06/25/2018 2131   GLUCOSEU NEGATIVE 06/25/2018 2131   GLUCOSEU >=500 12/29/2013 1210   HGBUR NEGATIVE 06/25/2018 2131   HGBUR negative 04/14/2010 1435   BILIRUBINUR Negative 07/04/2018 1605   BILIRUBINUR Negative 07/17/2014 1455   BILIRUBINUR Negative 12/29/2013 1210   KETONESUR NEGATIVE 06/25/2018 2131   PROTEINUR Negative 07/04/2018 1605   PROTEINUR NEGATIVE 06/25/2018 2131    UROBILINOGEN 0.2 07/04/2018 1605   UROBILINOGEN negative 04/14/2010 1435   NITRITE Negative 07/04/2018 1605   NITRITE NEGATIVE 06/25/2018 2131   LEUKOCYTESUR Moderate (2+) (A) 07/04/2018 1605   LEUKOCYTESUR NEGATIVE 06/25/2018 2131   LEUKOCYTESUR Negative 12/29/2013 1210     Shonta Bourque M.D. [facility] 10/15/2018, 4:09 PM   After 7pm go to [email or web address] - password [email or web address]  Call night coverage person covering after 7pm

## 2018-10-15 NOTE — NC FL2 (Signed)
[place] LEVEL OF CARE SCREENING TOOL     IDENTIFICATION  Patient Name: [name] Birthdate: 17-Feb-1970 Sex: female Admission Date (Current Location): 10/12/2018  [facility] and [place] Number:  Herbalist and Address:  [facility], [place] [address] [place], [place] [address]      Provider Number: [id number]  Attending Physician Name and Address:  Albertine Patricia, MD  Relative Name and Phone Number:  [name], son, [phone]    Current Level of Care: Hospital Recommended Level of Care: [place] Prior Approval Number:    Date Approved/Denied:   PASRR Number: [id number]  Discharge Plan: SNF    Current Diagnoses: Patient Active Problem List   Diagnosis Date Noted  . Fall   . Closed nondisplaced intertrochanteric fracture of left femur ([place]) 10/12/2018  . Femur fracture, left ([place]) 10/12/2018  . Hypotension 06/26/2018  . Autonomic neuropathy 03/24/2018  . Acute delirium 03/03/2018  . H/O gastric bypass 03/02/2018  . Hypertension associated with stage 3 chronic kidney disease due to type 2 diabetes mellitus ([place]) 02/20/2018  . SI (sacroiliac) joint dysfunction 12/02/2017  . MI, acute, non ST segment elevation ([place])   . Effort angina ([place]) 06/29/2017  . Unstable angina ([place]) 06/24/2017  . Syncope 04/09/2017  . Insomnia 03/18/2017  . Ischemic cardiomyopathy   . Arthritis   . Anxiety   . Tendinitis of upper biceps tendon of right shoulder 03/16/2017  . Degenerative tear of glenoid labrum of right shoulder 03/15/2017  . Acute colitis 01/27/2017  . Hx of colonic polyps   . H/O medication noncompliance 12/14/2015  . Emesis   . Atherosclerosis of native coronary artery of native heart with stable angina pectoris ([place])   . Hypertensive heart disease   . CKD (chronic kidney disease), stage III ([place]) 06/05/2015  . Status post bariatric surgery 06/04/2015  . Colitis 06/03/2015  . Carotid stenosis  04/30/2015  . Type 2 diabetes mellitus with stage 3 chronic kidney disease, without long-term current use of insulin ([place])   . Stable angina pectoris ([place]) 04/17/2015  . Iron deficiency anemia 03/22/2015  . Vitamin B12 deficiency 02/18/2015  . Misuse of medications for pain 02/18/2015  . Major depressive disorder, recurrent, severe with psychotic features ([place]) 02/15/2015  . Helicobacter pylori infection 11/23/2014  . Hemiparesis, left ([place]) 11/23/2014  . Benign neoplasm of colon 11/20/2014  . Malignant melanoma ([place]) 08/25/2014  . Chronic systolic CHF (congestive heart failure) ([place])   . Incomplete bladder emptying 07/12/2014  . Hypothyroidism 12/30/2013  . Aberrant subclavian artery 11/17/2013  . Multiple sclerosis ([place]) 11/02/2013  . History of CVA with residual deficit 06/20/2013  . Headache, migraine 05/29/2013  . Hyperlipidemia   . GERD (gastroesophageal reflux disease)   . Neuropathy ([place]) 01/02/2011  . Stroke ([place]) 06/21/2008  . Depression with anxiety 05/01/2008  . Essential hypertension 05/01/2008    Orientation RESPIRATION BLADDER Height & Weight     Self, Time, Situation, Place  Normal Incontinent Weight:   Height:     BEHAVIORAL SYMPTOMS/MOOD NEUROLOGICAL BOWEL NUTRITION STATUS      Continent Diet(Please see DC Summary)  AMBULATORY STATUS COMMUNICATION OF NEEDS Skin   Extensive Assist Verbally Surgical wounds(Closed incision on hip)                       Personal Care Assistance Level of Assistance  Bathing, Feeding, Dressing Bathing Assistance: Maximum assistance Feeding assistance: Limited assistance Dressing Assistance: Limited  assistance     Functional Limitations Info  Sight, Hearing, Speech Sight Info: Adequate Hearing Info: Adequate Speech Info: Adequate    SPECIAL CARE FACTORS FREQUENCY  PT (By licensed PT), OT (By licensed OT)     PT Frequency: 5x OT Frequency: 5x            Contractures Contractures Info: Not present     Additional Factors Info  Code Status, Allergies, Psychotropic, Insulin Sliding Scale Code Status Info: Full Allergies Info: Lipitor (Atorvastatin), Tramadol, Lactose Intolerance (Gi) Psychotropic Info: Wellbutrin Insulin Sliding Scale Info: See DC Summary for dose       Current Medications (10/15/2018):  This is the current hospital active medication list Current Facility-Administered Medications  Medication Dose Route Frequency Provider Last Rate Last Dose  . 0.9 %  sodium chloride infusion (Manually program via Guardrails IV Fluids)   Intravenous Once Elgergawy, Silver Huguenin, MD      . 0.9 %  sodium chloride infusion   Intravenous Continuous Leandrew Koyanagi, MD 75 mL/hr at 10/15/18 0244    . acetaminophen (TYLENOL) tablet 325-650 mg  325-650 mg Oral Q6H PRN Leandrew Koyanagi, MD      . ALPRAZolam Duanne Moron) tablet 1 mg  1 mg Oral TID PRN Leandrew Koyanagi, MD   1 mg at 10/15/18 1036  . alum & mag hydroxide-simeth (MAALOX/MYLANTA) 200-200-20 MG/5ML suspension 30 mL  30 mL Oral Q4H PRN Leandrew Koyanagi, MD      . amoxicillin (AMOXIL) capsule 1,000 mg  1,000 mg Oral BID Leandrew Koyanagi, MD   1,000 mg at 10/15/18 1039  . aspirin EC tablet 325 mg  325 mg Oral Daily Leandrew Koyanagi, MD   325 mg at 10/15/18 1020  . buPROPion (WELLBUTRIN XL) 24 hr tablet 150 mg  150 mg Oral Daily Leandrew Koyanagi, MD   150 mg at 10/15/18 1020  . clarithromycin (BIAXIN) tablet 500 mg  500 mg Oral BID Leandrew Koyanagi, MD   500 mg at 10/15/18 1037  . clopidogrel (PLAVIX) tablet 75 mg  75 mg Oral Daily Leandrew Koyanagi, MD   75 mg at 10/15/18 1021  . diphenoxylate-atropine (LOMOTIL) 2.5-0.025 MG per tablet 1 tablet  1 tablet Oral QID PRN Leandrew Koyanagi, MD      . docusate sodium (COLACE) capsule 100 mg  100 mg Oral BID Leandrew Koyanagi, MD   100 mg at 10/15/18 1022  . gabapentin (NEURONTIN) capsule 300 mg  300 mg Oral BID Leandrew Koyanagi, MD   300 mg at 10/15/18 1018  . HYDROcodone-acetaminophen (NORCO) 7.5-325 MG per tablet 1-2 tablet  1-2 tablet  Oral Q4H PRN Leandrew Koyanagi, MD   2 tablet at 10/14/18 2135  . HYDROcodone-acetaminophen (NORCO/VICODIN) 5-325 MG per tablet 1-2 tablet  1-2 tablet Oral Q4H PRN Leandrew Koyanagi, MD   2 tablet at 10/15/18 1017  . insulin aspart (novoLOG) injection 0-5 Units  0-5 Units Subcutaneous QHS Leandrew Koyanagi, MD   3 Units at 10/14/18 2152  . insulin aspart (novoLOG) injection 0-9 Units  0-9 Units Subcutaneous TID WC Leandrew Koyanagi, MD   2 Units at 10/14/18 1646  . isosorbide mononitrate (IMDUR) 24 hr tablet 30 mg  30 mg Oral Daily Leandrew Koyanagi, MD   30 mg at 10/15/18 1021  . levothyroxine (SYNTHROID) tablet 25 mcg  25 mcg Oral QAC breakfast Leandrew Koyanagi, MD   25 mcg at 10/15/18 1039  . losartan (COZAAR)  tablet 25 mg  25 mg Oral Daily Leandrew Koyanagi, MD   25 mg at 10/14/18 0810  . magnesium citrate solution 1 Bottle  1 Bottle Oral Once PRN Leandrew Koyanagi, MD      . menthol-cetylpyridinium (CEPACOL) lozenge 3 mg  1 lozenge Oral PRN Leandrew Koyanagi, MD       Or  . phenol (CHLORASEPTIC) mouth spray 1 spray  1 spray Mouth/Throat PRN Leandrew Koyanagi, MD      . methocarbamol (ROBAXIN) tablet 500 mg  500 mg Oral Q6H PRN Leandrew Koyanagi, MD       Or  . methocarbamol (ROBAXIN) 500 mg in dextrose 5 % 50 mL IVPB  500 mg Intravenous Q6H PRN Leandrew Koyanagi, MD      . metoprolol succinate (TOPROL-XL) 24 hr tablet 12.5 mg  12.5 mg Oral Daily Leandrew Koyanagi, MD   12.5 mg at 10/15/18 1028  . morphine 2 MG/ML injection 1 mg  1 mg Intravenous Q2H PRN Leandrew Koyanagi, MD   1 mg at 10/14/18 1646  . ondansetron (ZOFRAN) tablet 4 mg  4 mg Oral Q6H PRN Leandrew Koyanagi, MD       Or  . ondansetron [facility]) injection 4 mg  4 mg Intravenous Q6H PRN Leandrew Koyanagi, MD      . pantoprazole (PROTONIX) EC tablet 40 mg  40 mg Oral BID Leandrew Koyanagi, MD   40 mg at 10/15/18 1022  . polyethylene glycol (MIRALAX / GLYCOLAX) packet 17 g  17 g Oral Daily PRN Leandrew Koyanagi, MD      . rosuvastatin (CRESTOR) tablet 40 mg  40 mg Oral Daily Leandrew Koyanagi, MD   40 mg  at 10/15/18 1019  . sorbitol 70 % solution 30 mL  30 mL Oral Daily PRN Leandrew Koyanagi, MD      . venlafaxine XR (EFFEXOR-XR) 24 hr capsule 75 mg  75 mg Oral Q breakfast Leandrew Koyanagi, MD   75 mg at 10/15/18 1039     Discharge Medications: Please see discharge summary for a list of discharge medications.  Relevant Imaging Results:  Relevant Lab Results:   Additional Information SSN: [id number]   COVID negative on 9/9  [place], LCSW

## 2018-10-15 NOTE — TOC Progression Note (Signed)
Transition of Care [facility]) - Progression Note    Patient Details  Name: [name] MRN: [id number] Date of Birth: Mar 18, 1970  [facility]) CM/SW [place], LCSW Phone Number: 10/15/2018, 12:38 PM  Clinical Narrative:    CSW spoke with patient again regarding discharge plan. She reports that she is agreeable to SNF. She calls her mother while CSW is present to remember which SNF to ask for; they request 1-[facility] 2-[facility] 3-[facility]. CSW will send out referral and follow up with bed availability. Patient will need an updated COVID test 24-48hrs prior to discharge.    Expected Discharge Plan: (SNF vs. HH) Barriers to Discharge: Continued Medical Work up  Expected Discharge Plan and Services Expected Discharge Plan: (SNF vs. HH) In-house Referral: Clinical Social Work Discharge Planning Services: NA Post Acute Care Choice: [place] arrangements for the past 2 months: Single Family Home                 DME Arranged: N/A DME Agency: NA       HH Arranged: NA [place] Agency: NA         Social Determinants of Health (SDOH) Interventions    Readmission Risk Interventions Readmission Risk Prevention Plan 06/26/2018 06/16/2018  Transportation Screening Complete Complete  Medication Review Press photographer) Complete Complete  PCP or Specialist appointment within 3-5 days of discharge - Complete  HRI or Home Care Consult Complete Complete  SW Recovery Care/Counseling Consult - Not Complete  SW Consult Not Complete Comments - NA  Palliative Care Screening - Not [place] - Not Applicable  Some recent data might be hidden

## 2018-10-15 NOTE — Progress Notes (Signed)
Physical Therapy Treatment Patient Details Name: [name] MRN: [id number] DOB: [birth year] Today's Date: 10/15/2018    History of Present Illness Pt is a 48 y.o. female admitted 10/12/18 after fall on steps and hitting head; pt sustained L femur fx. Head and cervical CT negative for acute injury. Pt s/p L intertrochanteric IM nail 9/10. PMH includes multiple sclerosis, CVA, CHF, HTN, DM2, CKD III, HLD, depression.    PT Comments    Patient seen for mobility progression. Pt premedicated prior to arrival however continues to be limited by c/o pain. Pt tolerated gait training distance of 6 ft and requires min/mod A +2 for safety with OOB mobility. Continue to progress as tolerated with anticipated d/c to SNF for further skilled PT services.     Follow Up Recommendations  SNF;Supervision for mobility/OOB     Equipment Recommendations  Rolling walker with 5" wheels;Wheelchair (measurements PT);Wheelchair cushion (measurements PT)    Recommendations for Other Services       Precautions / Restrictions Precautions Precautions: Fall Restrictions Weight Bearing Restrictions: Yes LLE Weight Bearing: Weight bearing as tolerated    Mobility  Bed Mobility Overal bed mobility: Needs Assistance Bed Mobility: Supine to Sit;Sit to Supine     Supine to sit: Mod assist;+2 for physical assistance Sit to supine: Mod assist;+2 for safety/equipment   General bed mobility comments: cues for sequencing and technique; assist with bringing L LE to EOB and elevating trunk into sitting; use of rails; assist to bring bilat LE into bed   Transfers Overall transfer level: Needs assistance Equipment used: Rolling walker (2 wheeled) Transfers: Sit to/from Stand Sit to Stand: Mod assist Stand pivot transfers: Mod assist       General transfer comment: assist to power up into standing with cues for placement of hands and feet and then for posture upon standing; pt requires a lot of increased  time to achieve upright standing; after short distance gait pt pivoted chair to EOB as she declined staying up in recliner  Ambulation/Gait Ambulation/Gait assistance: +2 safety/equipment;Mod assist;Min assist Gait Distance (Feet): 6 Feet Assistive device: Rolling walker (2 wheeled) Gait Pattern/deviations: Step-to pattern;Decreased weight shift to left;Antalgic;Decreased stance time - left;Decreased step length - right;Decreased step length - left;Shuffle;Trunk flexed Gait velocity: Decreased   General Gait Details: step by step cues for upright posture, using bilat UE for support, sequencing, and increased step lengths; assist for balance and guiding RW; chair brought up behind pt to sit due to pain    Stairs             Wheelchair Mobility    Modified Rankin (Stroke Patients Only)       Balance Overall balance assessment: Needs assistance   Sitting balance-Leahy Scale: Fair     Standing balance support: Bilateral upper extremity supported Standing balance-Leahy Scale: Poor                              Cognition Arousal/Alertness: Awake/alert Behavior During Therapy: WFL for tasks assessed/performed Overall Cognitive Status: Within Functional Limits for tasks assessed                                        Exercises      General Comments General comments (skin integrity, edema, etc.): pt asking therapist several times during session to ask for "something stronger" in reference  to pain medication as pt reports no relief from current medications      Pertinent Vitals/Pain Pain Assessment: Faces Faces Pain Scale: Hurts whole lot Pain Location: LLE Pain Descriptors / Indicators: Grimacing;Guarding;Operative site guarding;Moaning Pain Intervention(s): Limited activity within patient's tolerance;Monitored during session;Premedicated before session;Repositioned    Home Living                      Prior Function             PT Goals (current goals can now be found in the care plan section) Progress towards PT goals: Progressing toward goals    Frequency    Min 3X/week      PT Plan Current plan remains appropriate    Co-evaluation              AM-PAC PT "6 Clicks" Mobility   Outcome Measure  Help needed turning from your back to your side while in a flat bed without using bedrails?: A Lot Help needed moving from lying on your back to sitting on the side of a flat bed without using bedrails?: A Lot Help needed moving to and from a bed to a chair (including a wheelchair)?: A Lot Help needed standing up from a chair using your arms (e.g., wheelchair or bedside chair)?: A Lot Help needed to walk in hospital room?: A Lot Help needed climbing 3-5 steps with a railing? : A Lot 6 Click Score: 12    End of Session Equipment Utilized During Treatment: Gait belt Activity Tolerance: Patient limited by pain Patient left: with call bell/phone within reach;in bed;with bed alarm set Nurse Communication: Mobility status PT Visit Diagnosis: Other abnormalities of gait and mobility (R26.89);Pain Pain - Right/Left: Left Pain - part of body: Leg;Hip     Time: 0175-1025 PT Time Calculation (min) (ACUTE ONLY): 27 min  Charges:  $Gait Training: 23-37 mins                     Earney Navy, PTA Acute Rehabilitation Services Pager: [phone] Office: [phone]     Darliss Cheney 10/15/2018, 3:51 PM

## 2018-10-15 NOTE — TOC Progression Note (Signed)
Transition of Care [facility]) - Progression Note    Patient Details  Name: [name] MRN: [id number] Date of Birth: 16-Jun-1970  Transition of Care [facility]) CM/SW [place], LCSW Phone Number: 10/15/2018, 1:04 PM  Clinical Narrative:    [facility] has accepted the patient. Per MD, hopefully medically stable Monday and will obtain updated COVID test.    Expected Discharge Plan: (SNF vs. HH) Barriers to Discharge: Continued Medical Work up  Expected Discharge Plan and Services Expected Discharge Plan: (SNF vs. HH) In-house Referral: Clinical Social Work Discharge Planning Services: NA Post Acute Care Choice: [place] arrangements for the past 2 months: Single Family Home                 DME Arranged: N/A DME Agency: NA       HH Arranged: NA [place] Agency: NA         Social Determinants of Health (SDOH) Interventions    Readmission Risk Interventions Readmission Risk Prevention Plan 06/26/2018 06/16/2018  Transportation Screening Complete Complete  Medication Review Press photographer) Complete Complete  PCP or Specialist appointment within 3-5 days of discharge - Complete  HRI or Home Care Consult Complete Complete  SW Recovery Care/Counseling Consult - Not Complete  SW Consult Not Complete Comments - NA  Palliative Care Screening - Not [place] - Not Applicable  Some recent data might be hidden

## 2018-10-16 ENCOUNTER — Encounter (HOSPITAL_COMMUNITY): Payer: Self-pay | Admitting: *Deleted

## 2018-10-16 LAB — BASIC METABOLIC PANEL
Anion gap: 8 (ref 5–15)
BUN: 22 mg/dL — ABNORMAL HIGH (ref 6–20)
CO2: 24 mmol/L (ref 22–32)
Calcium: 8.2 mg/dL — ABNORMAL LOW (ref 8.9–10.3)
Chloride: 108 mmol/L (ref 98–111)
Creatinine, Ser: 1.62 mg/dL — ABNORMAL HIGH (ref 0.44–1.00)
GFR calc Af Amer: 43 mL/min — ABNORMAL LOW (ref >60)
GFR calc non Af Amer: 37 mL/min — ABNORMAL LOW (ref >60)
Glucose, Bld: 82 mg/dL (ref 70–99)
Potassium: 4.5 mmol/L (ref 3.5–5.1)
Sodium: 140 mmol/L (ref 135–145)

## 2018-10-16 LAB — GLUCOSE, CAPILLARY
Glucose-Capillary: 75 mg/dL (ref 70–99)
Glucose-Capillary: 76 mg/dL (ref 70–99)
Glucose-Capillary: 75 mg/dL (ref 70–99)
Glucose-Capillary: 225 mg/dL — ABNORMAL HIGH (ref 70–99)
Glucose-Capillary: 123 mg/dL — ABNORMAL HIGH (ref 70–99)

## 2018-10-16 LAB — CBC
HCT: 24.2 % — ABNORMAL LOW (ref 36.0–46.0)
Hemoglobin: 7.5 g/dL — ABNORMAL LOW (ref 12.0–15.0)
MCH: 22.9 pg — ABNORMAL LOW (ref 26.0–34.0)
MCHC: 31 g/dL (ref 30.0–36.0)
MCV: 74 fL — ABNORMAL LOW (ref 80.0–100.0)
Platelets: 130 10*3/uL — ABNORMAL LOW (ref 150–400)
RBC: 3.27 MIL/uL — ABNORMAL LOW (ref 3.87–5.11)
RDW: 18.2 % — ABNORMAL HIGH (ref 11.5–15.5)
WBC: 8.6 10*3/uL (ref 4.0–10.5)
nRBC: 0 % (ref 0.0–0.2)

## 2018-10-16 NOTE — Progress Notes (Signed)
[facility]                                                                              Patient Demographics  [address] [name], is a 48 y.o. female, DOB - 04-14-1970, MWN:[id number]  Admit date - 10/12/2018   Admitting Physician Anselm Jungling, DO  Outpatient Primary MD for the patient is Fisher, Demetrios Isaacs, MD  Outpatient specialists:   LOS - 4  days   Medical records reviewed and are as summarized below:    Chief Complaint  Patient presents with   Fall       Brief summary   Patient is a 48 year old female with history of CVA, MS, chronic systolic CHF, hypertension, diabetes type 2, stage III CKD, hyperlipidemia, depression presented to ED after mechanical fall.  Patient reports she has baseline unsteady gait, has frequent falls.  On the day of admission, she was climbing up one step of stairs, lost her balance and fell on her left side and also hit her head. CT head, C-spine negative. Left femur x-ray showed minimally displaced intertrochanteric fracture of the proximal left femur.   Subjective:   [address] [name] was seen and examined today.  Reports left hip pain, denies any fever, chills or shortness of breath .  Assessment & Plan       Closed nondisplaced intertrochanteric fracture of left femur (HCC) secondary to mechanical fall -Orthopedics was consulted, patient underwent ORIF with intramedullary implant on 10/13/2018, - pain control, DVT prophylaxis per orthopedics -Per Ortho, DVT prophylaxis aspirin 325 mg x 6 weeks in addition to restarting Plavix at 10 AM -WBAT LLE, follow-up with Dr. Roda Shutters in 2 weeks -PT consulted, recommendation for SNF placement   History of CVA -Restart Plavix, continue statin    Depression with anxiety -Continue Wellbutrin, Effexor, Xanax prn  Diabetes mellitus type 2, NIDDM, with neuropathy -Continue sliding scale insulin, hemoglobin A1c 5.4   Hyperlipidemia -Continue Crest  Hypothyroidism -Continue  levothyroxine  Iron deficiency anemia, chronic gastritis.  Acute blood loss anemia -Continue PPI -Baseline hemoglobin ~10 -Hemoglobin is 7.5 this morning, will need iron supplements on discharge  Hypertension Continue metoprolol, Imdur, losartan  Recent diagnosis of H. pylori gastritis -Patient has been following with her PCP closely, had chronic epigastric pain, nausea and vomiting.  Patient was found to have H. pylori gastritis -Ultrasound abdomen was unremarkable. -Patient was started on amoxicillin 1000 mg twice daily and Biaxin 500 mg twice a day for 14 days.  -Continue PPI Patient was seen by her PCP on 9/9, was recommended to finish the regimen and repeat H. pylori breath test after she has completed the therapy.  Stop date 10/17/2018  History of IBS -Placed on stool softeners, follow closely  CKD stage III -Baseline creatinine appears to be 1.5-1.7 -Presented with creatinine of 1.8   Leukocytosis Likely stress demargination, no fevers or chills or other complaints, follow CBC Chest x-ray 9/9 showed no pneumonia.  Code Status: Full code DVT Prophylaxis: Per orthopedics Family Communication: Discussed with patient  Disposition Plan: Will need SNF placement  Procedures:  Open treatment of intertrochanteric fracture with intramedullary implant.  10/13/2018  Consultants:   Orthopedics  Antimicrobials:   Anti-infectives (From admission, onward)   Start     Dose/Rate Route Frequency Ordered Stop   10/13/18 2300  ceFAZolin (ANCEF) IVPB 2g/100 mL premix     2 g 200 mL/hr over 30 Minutes Intravenous Every 6 hours 10/13/18 1710 10/14/18 1055   10/13/18 1200  ceFAZolin (ANCEF) IVPB 2g/100 mL premix     2 g 200 mL/hr over 30 Minutes Intravenous To Short Stay 10/12/18 2324 10/13/18 1458   10/13/18 1200  amoxicillin (AMOXIL) capsule 1,000 mg     1,000 mg Oral 2 times daily 10/13/18 1111 10/17/18 2159   10/13/18 1200  clarithromycin (BIAXIN) tablet 500 mg     500 mg Oral 2  times daily 10/13/18 1111 10/17/18 2159         Medications  Scheduled Meds:  sodium chloride   Intravenous Once   amoxicillin  1,000 mg Oral BID   aspirin EC  325 mg Oral Daily   buPROPion  150 mg Oral Daily   clarithromycin  500 mg Oral BID   clopidogrel  75 mg Oral Daily   docusate sodium  100 mg Oral BID   gabapentin  300 mg Oral BID   insulin aspart  0-5 Units Subcutaneous QHS   insulin aspart  0-9 Units Subcutaneous TID WC   isosorbide mononitrate  30 mg Oral Daily   levothyroxine  25 mcg Oral QAC breakfast   losartan  25 mg Oral Daily   metoprolol succinate  12.5 mg Oral Daily   pantoprazole  40 mg Oral BID   rosuvastatin  40 mg Oral Daily   venlafaxine XR  75 mg Oral Q breakfast   Continuous Infusions:  sodium chloride 10 mL/hr (10/15/18 1517)   methocarbamol (ROBAXIN) IV     PRN Meds:.acetaminophen, ALPRAZolam, alum & mag hydroxide-simeth, diphenoxylate-atropine, HYDROcodone-acetaminophen, HYDROcodone-acetaminophen, magnesium citrate, menthol-cetylpyridinium **OR** phenol, methocarbamol **OR** methocarbamol (ROBAXIN) IV, morphine injection, ondansetron **OR** ondansetron (ZOFRAN) IV, polyethylene glycol, sorbitol    Objective:   Vitals:   10/15/18 1945 10/16/18 0429 10/16/18 0757 10/16/18 0905  BP: (!) 185/90 (!) 162/77 (!) 165/83   Pulse: 98 70 80   Resp: 18 19 16    Temp: 98.6 F (37 C) 98.3 F (36.8 C) 99.1 F (37.3 C)   TempSrc: Oral Oral Oral   SpO2: 99% 97% 100%   Weight:    78 kg  Height:    5\' 2"  (1.575 m)    Intake/Output Summary (Last 24 hours) at 10/16/2018 1541 Last data filed at 10/16/2018 1424 Gross per 24 hour  Intake 480 ml  Output 3400 ml  Net -2920 ml     Wt Readings from Last 3 Encounters:  10/16/18 78 kg  10/12/18 69.5 kg  08/31/18 72.1 kg   Physical Exam  Awake Alert, Oriented X 3, No new F.N deficits, Normal affect Symmetrical Chest wall movement, Good air movement bilaterally, CTAB RRR,No  Gallops,Rubs or new Murmurs, No Parasternal Heave +ve B.Sounds, Abd Soft, No tenderness, No rebound - guarding or rigidity. No Cyanosis, Clubbing or edema, No new Rash or bruise     Data Reviewed:  I have personally reviewed following labs and imaging studies  Micro Results Recent Results (from the past 240 hour(s))  SARS Coronavirus 2 [name] order, Performed in [facility] hospital lab) Nasopharyngeal Nasopharyngeal Swab     Status: None   Collection Time: 10/12/18  6:00 PM   Specimen: Nasopharyngeal Swab  Result Value Ref Range Status  SARS Coronavirus 2 NEGATIVE NEGATIVE Final    Comment: (NOTE) If result is NEGATIVE SARS-CoV-2 target nucleic acids are NOT DETECTED. The SARS-CoV-2 RNA is generally detectable in upper and lower  respiratory specimens during the acute phase of infection. The lowest  concentration of SARS-CoV-2 viral copies this assay can detect is 250  copies / mL. A negative result does not preclude SARS-CoV-2 infection  and should not be used as the sole basis for treatment or other  patient management decisions.  A negative result may occur with  improper specimen collection / handling, submission of specimen other  than nasopharyngeal swab, presence of viral mutation(s) within the  areas targeted by this assay, and inadequate number of viral copies  (<250 copies / mL). A negative result must be combined with clinical  observations, patient history, and epidemiological information. If result is POSITIVE SARS-CoV-2 target nucleic acids are DETECTED. The SARS-CoV-2 RNA is generally detectable in upper and lower  respiratory specimens dur ing the acute phase of infection.  Positive  results are indicative of active infection with SARS-CoV-2.  Clinical  correlation with patient history and other diagnostic information is  necessary to determine patient infection status.  Positive results do  not rule out bacterial infection or co-infection with other  viruses. If result is PRESUMPTIVE POSTIVE SARS-CoV-2 nucleic acids MAY BE PRESENT.   A presumptive positive result was obtained on the submitted specimen  and confirmed on repeat testing.  While 2019 novel coronavirus  (SARS-CoV-2) nucleic acids may be present in the submitted sample  additional confirmatory testing may be necessary for epidemiological  and / or clinical management purposes  to differentiate between  SARS-CoV-2 and other Sarbecovirus currently known to infect humans.  If clinically indicated additional testing with an alternate test  methodology [phone]) is advised. The SARS-CoV-2 RNA is generally  detectable in upper and lower respiratory sp ecimens during the acute  phase of infection. The expected result is Negative. Fact Sheet for Patients:  BoilerBrush.com.cy Fact Sheet for Healthcare Providers: https://pope.com/ This test is not yet approved or cleared by the [place] FDA and has been authorized for detection and/or diagnosis of SARS-CoV-2 by FDA under an Emergency Use Authorization (EUA).  This EUA will remain in effect (meaning this test can be used) for the duration of the COVID-19 declaration under Section 564(b)(1) of the Act, 21 U.S.C. section 360bbb-3(b)(1), unless the authorization is terminated or revoked sooner. Performed at [facility], [address], [place], [place] [address]   Surgical pcr screen     Status: None   Collection Time: 10/13/18 12:08 AM   Specimen: Nasal Mucosa; Nasal Swab  Result Value Ref Range Status   MRSA, PCR NEGATIVE NEGATIVE Final   Staphylococcus aureus NEGATIVE NEGATIVE Final    Comment: (NOTE) The Xpert SA Assay (FDA approved for NASAL specimens in patients 35 years of age and older), is one component of a comprehensive surveillance program. It is not intended to diagnose infection nor to guide or monitor treatment. Performed at [facility], [address], [place], [place] [address]     Radiology Reports Dg Chest 1 View  Result Date: 10/12/2018 CLINICAL DATA:  Fall. EXAM: CHEST  1 VIEW COMPARISON:  Radiograph Jun 25, 2018. FINDINGS: Stable cardiomediastinal silhouette. No pneumothorax or pleural effusion is noted. Both lungs are clear. The visualized skeletal structures are unremarkable. IMPRESSION: No active disease. Electronically Signed   By: Lupita Raider M.D.   On: 10/12/2018  17:01   Dg Pelvis 1-2 Views  Result Date: 10/12/2018 CLINICAL DATA:  Fall. EXAM: PELVIS - 1-2 VIEW COMPARISON:  None. FINDINGS: Minimally displaced fracture is seen involving the intertrochanteric region of the proximal left femur. Right hip is unremarkable. Pelvis appears normal. IMPRESSION: Minimally displaced intertrochanteric fracture of the proximal left femur. Electronically Signed   By: Lupita Raider M.D.   On: 10/12/2018 16:58   Ct Head Wo Contrast  Result Date: 10/12/2018 CLINICAL DATA:  Status post fall backwards.  Hit her head on a car. EXAM: CT HEAD WITHOUT CONTRAST CT CERVICAL SPINE WITHOUT CONTRAST TECHNIQUE: Multidetector CT imaging of the head and cervical spine was performed following the standard protocol without intravenous contrast. Multiplanar CT image reconstructions of the cervical spine were also generated. COMPARISON:  06/05/2012 FINDINGS: CT HEAD FINDINGS Brain: No evidence of acute infarction, hemorrhage, hydrocephalus, extra-axial collection or mass lesion/mass effect. Old right basal ganglia lacunar infarct. Periventricular white matter low attenuation as can be seen with microvascular disease. Mild generalized cerebral atrophy. Vascular: No hyperdense vessel. Intracranial atherosclerotic disease. Skull: No osseous abnormality. Sinuses/Orbits: Visualized paranasal sinuses are clear. Visualized mastoid sinuses are clear. Visualized orbits demonstrate no focal abnormality. Other: None CT CERVICAL SPINE FINDINGS Alignment: Normal. Skull base  and vertebrae: No acute fracture. No primary bone lesion or focal pathologic process. Soft tissues and spinal canal: No prevertebral fluid or swelling. No visible canal hematoma. Disc levels:  Disc spaces are maintained.  No foraminal stenosis. Upper chest: Lung apices are clear. Right-sided aortic arch with ligation of the left subclavian artery. Other: No fluid collection or hematoma. IMPRESSION: 1. No acute intracranial pathology. 2.  No acute osseous injury of the cervical spine. Electronically Signed   By: Elige Ko   On: 10/12/2018 17:17   Ct Cervical Spine Wo Contrast  Result Date: 10/12/2018 CLINICAL DATA:  Status post fall backwards.  Hit her head on a car. EXAM: CT HEAD WITHOUT CONTRAST CT CERVICAL SPINE WITHOUT CONTRAST TECHNIQUE: Multidetector CT imaging of the head and cervical spine was performed following the standard protocol without intravenous contrast. Multiplanar CT image reconstructions of the cervical spine were also generated. COMPARISON:  06/05/2012 FINDINGS: CT HEAD FINDINGS Brain: No evidence of acute infarction, hemorrhage, hydrocephalus, extra-axial collection or mass lesion/mass effect. Old right basal ganglia lacunar infarct. Periventricular white matter low attenuation as can be seen with microvascular disease. Mild generalized cerebral atrophy. Vascular: No hyperdense vessel. Intracranial atherosclerotic disease. Skull: No osseous abnormality. Sinuses/Orbits: Visualized paranasal sinuses are clear. Visualized mastoid sinuses are clear. Visualized orbits demonstrate no focal abnormality. Other: None CT CERVICAL SPINE FINDINGS Alignment: Normal. Skull base and vertebrae: No acute fracture. No primary bone lesion or focal pathologic process. Soft tissues and spinal canal: No prevertebral fluid or swelling. No visible canal hematoma. Disc levels:  Disc spaces are maintained.  No foraminal stenosis. Upper chest: Lung apices are clear. Right-sided aortic arch with ligation of the left  subclavian artery. Other: No fluid collection or hematoma. IMPRESSION: 1. No acute intracranial pathology. 2.  No acute osseous injury of the cervical spine. Electronically Signed   By: Elige Ko   On: 10/12/2018 17:17   US Abdomen Complete  Result Date: 10/05/2018 CLINICAL DATA:  Nausea and vomiting EXAM: ABDOMEN ULTRASOUND COMPLETE COMPARISON:  03/19/2017 renal ultrasound, CT 01/27/2017 FINDINGS: Gallbladder: Surgically absent Common bile duct: Diameter: 3.4 mm Liver: No focal lesion identified. Within normal limits in parenchymal echogenicity. Portal vein is patent on color Doppler imaging with normal direction of  blood flow towards the liver. IVC: No abnormality visualized. Pancreas: Not well seen due to bowel gas Spleen: Size and appearance within normal limits. Right Kidney: Length: 8 cm. Echogenicity within normal limits. No mass or hydronephrosis visualized. Left Kidney: Length: 10.2 cm. Echogenicity within normal limits. No mass or hydronephrosis visualized. Abdominal aorta: No aneurysm visualized.  Maximum diameter 2.5 cm Other findings: None. IMPRESSION: 1. Status post cholecystectomy without biliary dilatation 2. Slight asymmetric size of kidneys right smaller than left but kidneys otherwise appear morphologically normal. Electronically Signed   By: Jasmine Pang M.D.   On: 10/05/2018 13:39   Dg Shoulder Left  Result Date: 10/12/2018 CLINICAL DATA:  Status post fall backwards. EXAM: LEFT SHOULDER - 2+ VIEW COMPARISON:  None. FINDINGS: There is no fracture or dislocation. There is minimal osteoarthritis of the left glenohumeral joint. There are mild degenerative changes of the acromioclavicular joint. IMPRESSION: No acute osseous injury of the left shoulder. Electronically Signed   By: Elige Ko   On: 10/12/2018 16:59   Dg C-arm 1-60 Min  Result Date: 10/13/2018 CLINICAL DATA:  Intramedullary nail left femur. EXAM: LEFT FEMUR 2 VIEWS; DG C-ARM 1-60 MIN COMPARISON:  10/12/2018 FINDINGS:  Interval fixation of patient's left femoral trochanteric fracture with intramedullary nail and associated screws bridging the fracture site into the femoral head. Hardware is intact with anatomic alignment over the fracture site. IMPRESSION: Fixation of left intertrochanteric fracture with hardware intact. Electronically Signed   By: Elberta Fortis M.D.   On: 10/13/2018 17:14   Dg Femur Min 2 Views Left  Result Date: 10/13/2018 CLINICAL DATA:  Intramedullary nail left femur. EXAM: LEFT FEMUR 2 VIEWS; DG C-ARM 1-60 MIN COMPARISON:  10/12/2018 FINDINGS: Interval fixation of patient's left femoral trochanteric fracture with intramedullary nail and associated screws bridging the fracture site into the femoral head. Hardware is intact with anatomic alignment over the fracture site. IMPRESSION: Fixation of left intertrochanteric fracture with hardware intact. Electronically Signed   By: Elberta Fortis M.D.   On: 10/13/2018 17:14   Dg Femur Min 2 Views Left  Result Date: 10/12/2018 CLINICAL DATA:  Fall. EXAM: LEFT FEMUR 2 VIEWS COMPARISON:  None. FINDINGS: Minimally displaced fracture is seen involving intertrochanteric region of proximal left femur. Vascular calcifications are noted. Remaining portion of femur is unremarkable. IMPRESSION: Minimally displaced intertrochanteric fracture of proximal left femur. Electronically Signed   By: Lupita Raider M.D.   On: 10/12/2018 16:59    Lab Data:  CBC: Recent Labs  Lab 10/12/18 1522 10/13/18 1019 10/14/18 0733 10/15/18 0556 10/16/18 0412  WBC 10.9* 9.4 13.5* 10.9* 8.6  HGB 9.7* 10.6* 9.3* 7.9* 7.5*  HCT 32.4* 35.4* 31.0* 25.5* 24.2*  MCV 74.1* 73.3* 73.8* 73.7* 74.0*  PLT 190 206 168 144* 130*   Basic Metabolic Panel: Recent Labs  Lab 10/12/18 1522 10/13/18 1019 10/14/18 0733 10/15/18 0556 10/16/18 0412  NA 140 141 138 144 140  K 4.2 4.4 5.0 4.4 4.5  CL 108 105 105 111 108  CO2 25 27 26 24 24   GLUCOSE 69* 84 183* 103* 82  BUN 16 16 19  23*  22*  CREATININE 1.81* 1.64* 1.89* 2.04* 1.62*  CALCIUM 8.5* 9.0 8.7* 8.4* 8.2*   GFR: Estimated Creatinine Clearance: 40.7 mL/min (A) (by C-G formula based on SCr of 1.62 mg/dL (H)). Liver Function Tests: Recent Labs  Lab 10/12/18 0944  AST 28  ALT 32  ALKPHOS 156*  BILITOT 0.2  PROT 5.9*  ALBUMIN 3.5*   No  results for input(s): LIPASE, AMYLASE in the last 168 hours. No results for input(s): AMMONIA in the last 168 hours. Coagulation Profile: No results for input(s): INR, PROTIME in the last 168 hours. Cardiac Enzymes: No results for input(s): CKTOTAL, CKMB, CKMBINDEX, TROPONINI in the last 168 hours. BNP (last 3 results) No results for input(s): PROBNP in the last 8760 hours. HbA1C: No results for input(s): HGBA1C in the last 72 hours. CBG: Recent Labs  Lab 10/15/18 1713 10/15/18 2022 10/16/18 0431 10/16/18 0637 10/16/18 1157  GLUCAP 66* 138* 75 76 75   Lipid Profile: No results for input(s): CHOL, HDL, LDLCALC, TRIG, CHOLHDL, LDLDIRECT in the last 72 hours. Thyroid Function Tests: No results for input(s): TSH, T4TOTAL, FREET4, T3FREE, THYROIDAB in the last 72 hours. Anemia Panel: No results for input(s): VITAMINB12, FOLATE, FERRITIN, TIBC, IRON, RETICCTPCT in the last 72 hours. Urine analysis:    Component Value Date/Time   COLORURINE STRAW (A) 06/25/2018 2131   APPEARANCEUR HAZY (A) 06/25/2018 2131   APPEARANCEUR Cloudy (A) 07/17/2014 1455   LABSPEC 1.003 (L) 06/25/2018 2131   LABSPEC 1.012 12/29/2013 1210   PHURINE 6.0 06/25/2018 2131   GLUCOSEU NEGATIVE 06/25/2018 2131   GLUCOSEU >=500 12/29/2013 1210   HGBUR NEGATIVE 06/25/2018 2131   HGBUR negative 04/14/2010 1435   BILIRUBINUR Negative 07/04/2018 1605   BILIRUBINUR Negative 07/17/2014 1455   BILIRUBINUR Negative 12/29/2013 1210   KETONESUR NEGATIVE 06/25/2018 2131   PROTEINUR Negative 07/04/2018 1605   PROTEINUR NEGATIVE 06/25/2018 2131   UROBILINOGEN 0.2 07/04/2018 1605   UROBILINOGEN negative  04/14/2010 1435   NITRITE Negative 07/04/2018 1605   NITRITE NEGATIVE 06/25/2018 2131   LEUKOCYTESUR Moderate (2+) (A) 07/04/2018 1605   LEUKOCYTESUR NEGATIVE 06/25/2018 2131   LEUKOCYTESUR Negative 12/29/2013 1210     Stancil Deisher M.D. [facility] 10/16/2018, 3:41 PM   After 7pm go to [email or web address] - password [email or web address]  Call night coverage person covering after 7pm

## 2018-10-17 ENCOUNTER — Encounter (HOSPITAL_COMMUNITY): Payer: Self-pay | Admitting: Orthopaedic Surgery

## 2018-10-17 LAB — CBC
HCT: 26.5 % — ABNORMAL LOW (ref 36.0–46.0)
Hemoglobin: 8.1 g/dL — ABNORMAL LOW (ref 12.0–15.0)
MCH: 22.9 pg — ABNORMAL LOW (ref 26.0–34.0)
MCHC: 30.6 g/dL (ref 30.0–36.0)
MCV: 74.9 fL — ABNORMAL LOW (ref 80.0–100.0)
Platelets: 141 10*3/uL — ABNORMAL LOW (ref 150–400)
RBC: 3.54 MIL/uL — ABNORMAL LOW (ref 3.87–5.11)
RDW: 17.8 % — ABNORMAL HIGH (ref 11.5–15.5)
WBC: 8.5 10*3/uL (ref 4.0–10.5)
nRBC: 0 % (ref 0.0–0.2)

## 2018-10-17 LAB — BASIC METABOLIC PANEL
Anion gap: 8 (ref 5–15)
BUN: 27 mg/dL — ABNORMAL HIGH (ref 6–20)
CO2: 26 mmol/L (ref 22–32)
Calcium: 8.5 mg/dL — ABNORMAL LOW (ref 8.9–10.3)
Chloride: 104 mmol/L (ref 98–111)
Creatinine, Ser: 1.59 mg/dL — ABNORMAL HIGH (ref 0.44–1.00)
GFR calc Af Amer: 44 mL/min — ABNORMAL LOW (ref >60)
GFR calc non Af Amer: 38 mL/min — ABNORMAL LOW (ref >60)
Glucose, Bld: 99 mg/dL (ref 70–99)
Potassium: 4.6 mmol/L (ref 3.5–5.1)
Sodium: 138 mmol/L (ref 135–145)

## 2018-10-17 LAB — GLUCOSE, CAPILLARY
Glucose-Capillary: 88 mg/dL (ref 70–99)
Glucose-Capillary: 78 mg/dL (ref 70–99)
Glucose-Capillary: 92 mg/dL (ref 70–99)
Glucose-Capillary: 123 mg/dL — ABNORMAL HIGH (ref 70–99)
Glucose-Capillary: 96 mg/dL (ref 70–99)

## 2018-10-17 LAB — NOVEL CORONAVIRUS, NAA (HOSP ORDER, SEND-OUT TO REF LAB; TAT 18-24 HRS): SARS-CoV-2, NAA: NOT DETECTED

## 2018-10-17 NOTE — Plan of Care (Signed)
  Problem: Education: Goal: Knowledge of General Education information will improve Description: Including pain rating scale, medication(s)/side effects and non-pharmacologic comfort measures Outcome: Progressing   Problem: Health Behavior/Discharge Planning: Goal: Ability to manage health-related needs will improve Outcome: Progressing   Problem: Clinical Measurements: Goal: Ability to maintain clinical measurements within normal limits will improve Outcome: Progressing Goal: Cardiovascular complication will be avoided Outcome: Progressing   Problem: Activity: Goal: Risk for activity intolerance will decrease Outcome: Progressing   Problem: Nutrition: Goal: Adequate nutrition will be maintained Outcome: Progressing   Problem: Coping: Goal: Level of anxiety will decrease Outcome: Progressing   Problem: Elimination: Goal: Will not experience complications related to bowel motility Outcome: Progressing   Problem: Pain Managment: Goal: General experience of comfort will improve Outcome: Progressing   Problem: Safety: Goal: Ability to remain free from injury will improve Outcome: Progressing   Problem: Skin Integrity: Goal: Risk for impaired skin integrity will decrease Outcome: Progressing

## 2018-10-17 NOTE — Consult Note (Signed)
[facility] CM Inpatient Consult   10/17/2018  [name] [name] 07-Jun-1970 [id number]  Patient screened for extreme high risk score for unplanned readmission score and for 3  hospitalizations in the past 6 months. Review of patient's medical record reveals patient is for Skilled nursing facility.  Currently, inpatient [facility] social worker notes reveals patient is for [facility] facility [currently not a [facility] affiliate facility].  In the Medicare ACO.  Primary Care Provider is Mila Merry, MD of [facility], this is an Embedded practice that provides post hospital follow up and transition of care.  Plan:  If patient admits to a [facility] affiliate skilled facility the [facility] RN will be alerted of transition and needs.  Continue to follow for disposition.  Please place a [facility] Care Management consult as appropriate and for questions contact:   Charlesetta Shanks, RN BSN CCM [facility]  [phone] business mobile phone Toll free office [phone]  Fax number: [phone] [email or web address]

## 2018-10-17 NOTE — Progress Notes (Signed)
Physical Therapy Treatment Patient Details Name: [name] MRN: [id number] DOB: 11-Oct-1970 Today's Date: 10/17/2018    History of Present Illness Pt is a 48 y.o. female admitted 10/12/18 after fall on steps and hitting head; pt sustained L femur fx. Head and cervical CT negative for acute injury. Pt s/p L intertrochanteric IM nail 9/10. PMH includes multiple sclerosis, CVA, CHF, HTN, DM2, CKD III, HLD, depression.    PT Comments    Patient seen for mobility progression. Pt is making progress toward PT goals and tolerating mobility with less pain. Continue to progress as tolerated with anticipated d/c to SNF for further skilled PT services.     Follow Up Recommendations  SNF;Supervision for mobility/OOB     Equipment Recommendations  Rolling walker with 5" wheels    Recommendations for Other Services       Precautions / Restrictions Precautions Precautions: Fall Restrictions Weight Bearing Restrictions: Yes LLE Weight Bearing: Weight bearing as tolerated    Mobility  Bed Mobility Overal bed mobility: Needs Assistance Bed Mobility: Sit to Supine       Sit to supine: Mod assist   General bed mobility comments: cues for sequencing; assist to bring bilat LE into bed  Transfers Overall transfer level: Needs assistance Equipment used: Rolling walker (2 wheeled) Transfers: Sit to/from Stand Sit to Stand: Min guard;Min assist         General transfer comment: carry over of safe hand placement  Ambulation/Gait Ambulation/Gait assistance: +2 safety/equipment;Min assist(chair follow) Gait Distance (Feet): 65 Feet Assistive device: Rolling walker (2 wheeled) Gait Pattern/deviations: Decreased weight shift to left;Antalgic;Decreased step length - right;Decreased step length - left;Trunk flexed;Step-through pattern Gait velocity: slow   General Gait Details: very slow cadence; cues for upright posture and attempting increased bilat step lengths    Stairs              Wheelchair Mobility    Modified Rankin (Stroke Patients Only)       Balance Overall balance assessment: Needs assistance   Sitting balance-Leahy Scale: Fair     Standing balance support: Bilateral upper extremity supported Standing balance-Leahy Scale: Poor                              Cognition Arousal/Alertness: Awake/alert Behavior During Therapy: WFL for tasks assessed/performed Overall Cognitive Status: Within Functional Limits for tasks assessed                                 General Comments: easily distracted       Exercises      General Comments        Pertinent Vitals/Pain Pain Assessment: Faces Faces Pain Scale: Hurts little more Pain Location: LLE Pain Descriptors / Indicators: Grimacing;Guarding;Moaning Pain Intervention(s): Limited activity within patient's tolerance;Monitored during session;Repositioned;Premedicated before session    Home Living                      Prior Function            PT Goals (current goals can now be found in the care plan section) Progress towards PT goals: Progressing toward goals    Frequency    Min 3X/week      PT Plan Current plan remains appropriate    Co-evaluation              AM-PAC PT "  6 Clicks" Mobility   Outcome Measure  Help needed turning from your back to your side while in a flat bed without using bedrails?: A Lot Help needed moving from lying on your back to sitting on the side of a flat bed without using bedrails?: A Lot Help needed moving to and from a bed to a chair (including a wheelchair)?: A Little Help needed standing up from a chair using your arms (e.g., wheelchair or bedside chair)?: A Little Help needed to walk in hospital room?: A Little Help needed climbing 3-5 steps with a railing? : A Lot 6 Click Score: 15    End of Session Equipment Utilized During Treatment: Gait belt Activity Tolerance: Patient tolerated  treatment well Patient left: with call bell/phone within reach;in bed;with bed alarm set Nurse Communication: Mobility status PT Visit Diagnosis: Other abnormalities of gait and mobility (R26.89);Pain Pain - Right/Left: Left Pain - part of body: Leg;Hip     Time: 8938-1017 PT Time Calculation (min) (ACUTE ONLY): 34 min  Charges:  $Gait Training: 23-37 mins                     Earney Navy, PTA Acute Rehabilitation Services Pager: [phone] Office: [phone]     Darliss Cheney 10/17/2018, 4:25 PM

## 2018-10-17 NOTE — Progress Notes (Signed)
[facility]                                                                              Patient Demographics  [address] [name], is a 48 y.o. female, DOB - 1970/02/12, IHK:[id number]  Admit date - 10/12/2018   Admitting Physician Anselm Jungling, DO  Outpatient Primary MD for the patient is Fisher, Demetrios Isaacs, MD  Outpatient specialists:   LOS - 5  days   Medical records reviewed and are as summarized below:    Chief Complaint  Patient presents with   Fall       Brief summary   Patient is a 48 year old female with history of CVA, MS, chronic systolic CHF, hypertension, diabetes type 2, stage III CKD, hyperlipidemia, depression presented to ED after mechanical fall.  Patient reports she has baseline unsteady gait, has frequent falls.  On the day of admission, she was climbing up one step of stairs, lost her balance and fell on her left side and also hit her head. CT head, C-spine negative. Left femur x-ray showed minimally displaced intertrochanteric fracture of the proximal left femur.   Subjective:   [address] [name] was seen and examined today.  Reports left hip pain, denies any fever, chills or shortness of breath .  Assessment & Plan       Closed nondisplaced intertrochanteric fracture of left femur (HCC) secondary to mechanical fall -Orthopedics was consulted, patient underwent ORIF with intramedullary implant on 10/13/2018, - pain control, DVT prophylaxis per orthopedics -Per Ortho, DVT prophylaxis aspirin 325 mg x 6 weeks in addition to restarting Plavix at 10 AM -WBAT LLE, follow-up with Dr. Roda Shutters in 2 weeks -PT consulted, recommendation for SNF placement   History of CVA -Restart Plavix, continue statin    Depression with anxiety -Continue Wellbutrin, Effexor, Xanax prn  Diabetes mellitus type 2, NIDDM, with neuropathy -Continue sliding scale insulin, hemoglobin A1c 5.4   Hyperlipidemia -Continue Crest  Hypothyroidism -Continue  levothyroxine  Iron deficiency anemia, chronic gastritis.  Acute blood loss anemia -Continue PPI -Baseline hemoglobin ~10 -Hemoglobin is 8.1 this morning, will need iron supplements on discharge  Hypertension Continue metoprolol, Imdur, losartan  Recent diagnosis of H. pylori gastritis -Patient has been following with her PCP closely, had chronic epigastric pain, nausea and vomiting.  Patient was found to have H. pylori gastritis -Ultrasound abdomen was unremarkable. -Patient was started on amoxicillin 1000 mg twice daily and Biaxin 500 mg twice a day for 14 days.  -Continue PPI Patient was seen by her PCP on 9/9, was recommended to finish the regimen and repeat H. pylori breath test after she has completed the therapy.  Stop date 10/17/2018  History of IBS -Placed on stool softeners, follow closely  CKD stage III -Baseline creatinine appears to be 1.5-1.7 -Presented with creatinine of 1.8   Leukocytosis Likely stress demargination, no fevers or chills or other complaints, follow CBC Chest x-ray 9/9 showed no pneumonia.  Code Status: Full code DVT Prophylaxis: Per orthopedics Family Communication: Discussed with patient  Disposition Plan: Will need SNF placement, awaiting results on negative COVID-19 test before facility accept the patient  Procedures:  Open treatment of intertrochanteric fracture with intramedullary implant.  10/13/2018  Consultants:   Orthopedics  Antimicrobials:   Anti-infectives (From admission, onward)   Start     Dose/Rate Route Frequency Ordered Stop   10/13/18 2300  ceFAZolin (ANCEF) IVPB 2g/100 mL premix     2 g 200 mL/hr over 30 Minutes Intravenous Every 6 hours 10/13/18 1710 10/14/18 1055   10/13/18 1200  ceFAZolin (ANCEF) IVPB 2g/100 mL premix     2 g 200 mL/hr over 30 Minutes Intravenous To Short Stay 10/12/18 2324 10/13/18 1458   10/13/18 1200  amoxicillin (AMOXIL) capsule 1,000 mg     1,000 mg Oral 2 times daily 10/13/18 1111 10/17/18  1005   10/13/18 1200  clarithromycin (BIAXIN) tablet 500 mg     500 mg Oral 2 times daily 10/13/18 1111 10/17/18 1007         Medications  Scheduled Meds:  sodium chloride   Intravenous Once   aspirin EC  325 mg Oral Daily   buPROPion  150 mg Oral Daily   clopidogrel  75 mg Oral Daily   docusate sodium  100 mg Oral BID   gabapentin  300 mg Oral BID   insulin aspart  0-5 Units Subcutaneous QHS   insulin aspart  0-9 Units Subcutaneous TID WC   isosorbide mononitrate  30 mg Oral Daily   levothyroxine  25 mcg Oral QAC breakfast   losartan  25 mg Oral Daily   metoprolol succinate  12.5 mg Oral Daily   pantoprazole  40 mg Oral BID   rosuvastatin  40 mg Oral Daily   venlafaxine XR  75 mg Oral Q breakfast   Continuous Infusions:  sodium chloride 10 mL/hr (10/15/18 1517)   methocarbamol (ROBAXIN) IV     PRN Meds:.acetaminophen, ALPRAZolam, alum & mag hydroxide-simeth, diphenoxylate-atropine, HYDROcodone-acetaminophen, HYDROcodone-acetaminophen, magnesium citrate, menthol-cetylpyridinium **OR** phenol, methocarbamol **OR** methocarbamol (ROBAXIN) IV, morphine injection, ondansetron **OR** ondansetron (ZOFRAN) IV, polyethylene glycol, sorbitol    Objective:   Vitals:   10/16/18 1608 10/16/18 1946 10/17/18 0330 10/17/18 0854  BP: (!) 169/88 (!) 163/94 (!) 164/93 (!) 163/78  Pulse: (!) 102 77 79 84  Resp: 16 17 18 18   Temp: 98.1 F (36.7 C) 98.4 F (36.9 C) 97.9 F (36.6 C) 98.5 F (36.9 C)  TempSrc: Oral Oral Oral   SpO2: 99% 98% 100% 100%  Weight:      Height:        Intake/Output Summary (Last 24 hours) at 10/17/2018 1548 Last data filed at 10/17/2018 1033 Gross per 24 hour  Intake 240 ml  Output 3000 ml  Net -2760 ml     Wt Readings from Last 3 Encounters:  10/16/18 78 kg  10/12/18 69.5 kg  08/31/18 72.1 kg   Physical Exam  Awake Alert, Oriented X 3, No new F.N deficits, Normal affect Symmetrical Chest wall movement, Good air movement  bilaterally, CTAB RRR,No Gallops,Rubs or new Murmurs, No Parasternal Heave +ve B.Sounds, Abd Soft, No tenderness, No rebound - guarding or rigidity. No Cyanosis, Clubbing or edema, No new Rash or bruise      Data Reviewed:  I have personally reviewed following labs and imaging studies  Micro Results Recent Results (from the past 240 hour(s))  SARS Coronavirus 2 [facility] order, Performed in [facility] hospital lab) Nasopharyngeal Nasopharyngeal Swab     Status: None   Collection Time: 10/12/18  6:00 PM   Specimen: Nasopharyngeal Swab  Result Value Ref Range Status   SARS Coronavirus 2  NEGATIVE NEGATIVE Final    Comment: (NOTE) If result is NEGATIVE SARS-CoV-2 target nucleic acids are NOT DETECTED. The SARS-CoV-2 RNA is generally detectable in upper and lower  respiratory specimens during the acute phase of infection. The lowest  concentration of SARS-CoV-2 viral copies this assay can detect is 250  copies / mL. A negative result does not preclude SARS-CoV-2 infection  and should not be used as the sole basis for treatment or other  patient management decisions.  A negative result may occur with  improper specimen collection / handling, submission of specimen other  than nasopharyngeal swab, presence of viral mutation(s) within the  areas targeted by this assay, and inadequate number of viral copies  (<250 copies / mL). A negative result must be combined with clinical  observations, patient history, and epidemiological information. If result is POSITIVE SARS-CoV-2 target nucleic acids are DETECTED. The SARS-CoV-2 RNA is generally detectable in upper and lower  respiratory specimens dur ing the acute phase of infection.  Positive  results are indicative of active infection with SARS-CoV-2.  Clinical  correlation with patient history and other diagnostic information is  necessary to determine patient infection status.  Positive results do  not rule out bacterial infection or  co-infection with other viruses. If result is PRESUMPTIVE POSTIVE SARS-CoV-2 nucleic acids MAY BE PRESENT.   A presumptive positive result was obtained on the submitted specimen  and confirmed on repeat testing.  While 2019 novel coronavirus  (SARS-CoV-2) nucleic acids may be present in the submitted sample  additional confirmatory testing may be necessary for epidemiological  and / or clinical management purposes  to differentiate between  SARS-CoV-2 and other Sarbecovirus currently known to infect humans.  If clinically indicated additional testing with an alternate test  methodology [phone]) is advised. The SARS-CoV-2 RNA is generally  detectable in upper and lower respiratory sp ecimens during the acute  phase of infection. The expected result is Negative. Fact Sheet for Patients:  BoilerBrush.com.cy Fact Sheet for Healthcare Providers: https://pope.com/ This test is not yet approved or cleared by the [place] FDA and has been authorized for detection and/or diagnosis of SARS-CoV-2 by FDA under an Emergency Use Authorization (EUA).  This EUA will remain in effect (meaning this test can be used) for the duration of the COVID-19 declaration under Section 564(b)(1) of the Act, 21 U.S.C. section 360bbb-3(b)(1), unless the authorization is terminated or revoked sooner. Performed at [facility], [address], [place], [place] [address]   Surgical pcr screen     Status: None   Collection Time: 10/13/18 12:08 AM   Specimen: Nasal Mucosa; Nasal Swab  Result Value Ref Range Status   MRSA, PCR NEGATIVE NEGATIVE Final   Staphylococcus aureus NEGATIVE NEGATIVE Final    Comment: (NOTE) The Xpert SA Assay (FDA approved for NASAL specimens in patients 34 years of age and older), is one component of a comprehensive surveillance program. It is not intended to diagnose infection nor to guide or monitor treatment. Performed at [facility], [address] [place], [place] [address]     Radiology Reports Dg Chest 1 View  Result Date: 10/12/2018 CLINICAL DATA:  Fall. EXAM: CHEST  1 VIEW COMPARISON:  Radiograph Jun 25, 2018. FINDINGS: Stable cardiomediastinal silhouette. No pneumothorax or pleural effusion is noted. Both lungs are clear. The visualized skeletal structures are unremarkable. IMPRESSION: No active disease. Electronically Signed   By: Lupita Raider M.D.   On: 10/12/2018 17:01  Dg Pelvis 1-2 Views  Result Date: 10/12/2018 CLINICAL DATA:  Fall. EXAM: PELVIS - 1-2 VIEW COMPARISON:  None. FINDINGS: Minimally displaced fracture is seen involving the intertrochanteric region of the proximal left femur. Right hip is unremarkable. Pelvis appears normal. IMPRESSION: Minimally displaced intertrochanteric fracture of the proximal left femur. Electronically Signed   By: Lupita Raider M.D.   On: 10/12/2018 16:58   Ct Head Wo Contrast  Result Date: 10/12/2018 CLINICAL DATA:  Status post fall backwards.  Hit her head on a car. EXAM: CT HEAD WITHOUT CONTRAST CT CERVICAL SPINE WITHOUT CONTRAST TECHNIQUE: Multidetector CT imaging of the head and cervical spine was performed following the standard protocol without intravenous contrast. Multiplanar CT image reconstructions of the cervical spine were also generated. COMPARISON:  06/05/2012 FINDINGS: CT HEAD FINDINGS Brain: No evidence of acute infarction, hemorrhage, hydrocephalus, extra-axial collection or mass lesion/mass effect. Old right basal ganglia lacunar infarct. Periventricular white matter low attenuation as can be seen with microvascular disease. Mild generalized cerebral atrophy. Vascular: No hyperdense vessel. Intracranial atherosclerotic disease. Skull: No osseous abnormality. Sinuses/Orbits: Visualized paranasal sinuses are clear. Visualized mastoid sinuses are clear. Visualized orbits demonstrate no focal abnormality. Other: None CT CERVICAL SPINE FINDINGS  Alignment: Normal. Skull base and vertebrae: No acute fracture. No primary bone lesion or focal pathologic process. Soft tissues and spinal canal: No prevertebral fluid or swelling. No visible canal hematoma. Disc levels:  Disc spaces are maintained.  No foraminal stenosis. Upper chest: Lung apices are clear. Right-sided aortic arch with ligation of the left subclavian artery. Other: No fluid collection or hematoma. IMPRESSION: 1. No acute intracranial pathology. 2.  No acute osseous injury of the cervical spine. Electronically Signed   By: Elige Ko   On: 10/12/2018 17:17   Ct Cervical Spine Wo Contrast  Result Date: 10/12/2018 CLINICAL DATA:  Status post fall backwards.  Hit her head on a car. EXAM: CT HEAD WITHOUT CONTRAST CT CERVICAL SPINE WITHOUT CONTRAST TECHNIQUE: Multidetector CT imaging of the head and cervical spine was performed following the standard protocol without intravenous contrast. Multiplanar CT image reconstructions of the cervical spine were also generated. COMPARISON:  06/05/2012 FINDINGS: CT HEAD FINDINGS Brain: No evidence of acute infarction, hemorrhage, hydrocephalus, extra-axial collection or mass lesion/mass effect. Old right basal ganglia lacunar infarct. Periventricular white matter low attenuation as can be seen with microvascular disease. Mild generalized cerebral atrophy. Vascular: No hyperdense vessel. Intracranial atherosclerotic disease. Skull: No osseous abnormality. Sinuses/Orbits: Visualized paranasal sinuses are clear. Visualized mastoid sinuses are clear. Visualized orbits demonstrate no focal abnormality. Other: None CT CERVICAL SPINE FINDINGS Alignment: Normal. Skull base and vertebrae: No acute fracture. No primary bone lesion or focal pathologic process. Soft tissues and spinal canal: No prevertebral fluid or swelling. No visible canal hematoma. Disc levels:  Disc spaces are maintained.  No foraminal stenosis. Upper chest: Lung apices are clear. Right-sided aortic  arch with ligation of the left subclavian artery. Other: No fluid collection or hematoma. IMPRESSION: 1. No acute intracranial pathology. 2.  No acute osseous injury of the cervical spine. Electronically Signed   By: Elige Ko   On: 10/12/2018 17:17   US Abdomen Complete  Result Date: 10/05/2018 CLINICAL DATA:  Nausea and vomiting EXAM: ABDOMEN ULTRASOUND COMPLETE COMPARISON:  03/19/2017 renal ultrasound, CT 01/27/2017 FINDINGS: Gallbladder: Surgically absent Common bile duct: Diameter: 3.4 mm Liver: No focal lesion identified. Within normal limits in parenchymal echogenicity. Portal vein is patent on color Doppler imaging with normal direction of blood flow towards  the liver. IVC: No abnormality visualized. Pancreas: Not well seen due to bowel gas Spleen: Size and appearance within normal limits. Right Kidney: Length: 8 cm. Echogenicity within normal limits. No mass or hydronephrosis visualized. Left Kidney: Length: 10.2 cm. Echogenicity within normal limits. No mass or hydronephrosis visualized. Abdominal aorta: No aneurysm visualized.  Maximum diameter 2.5 cm Other findings: None. IMPRESSION: 1. Status post cholecystectomy without biliary dilatation 2. Slight asymmetric size of kidneys right smaller than left but kidneys otherwise appear morphologically normal. Electronically Signed   By: Jasmine Pang M.D.   On: 10/05/2018 13:39   Dg Shoulder Left  Result Date: 10/12/2018 CLINICAL DATA:  Status post fall backwards. EXAM: LEFT SHOULDER - 2+ VIEW COMPARISON:  None. FINDINGS: There is no fracture or dislocation. There is minimal osteoarthritis of the left glenohumeral joint. There are mild degenerative changes of the acromioclavicular joint. IMPRESSION: No acute osseous injury of the left shoulder. Electronically Signed   By: Elige Ko   On: 10/12/2018 16:59   Dg C-arm 1-60 Min  Result Date: 10/13/2018 CLINICAL DATA:  Intramedullary nail left femur. EXAM: LEFT FEMUR 2 VIEWS; DG C-ARM 1-60 MIN  COMPARISON:  10/12/2018 FINDINGS: Interval fixation of patient's left femoral trochanteric fracture with intramedullary nail and associated screws bridging the fracture site into the femoral head. Hardware is intact with anatomic alignment over the fracture site. IMPRESSION: Fixation of left intertrochanteric fracture with hardware intact. Electronically Signed   By: Elberta Fortis M.D.   On: 10/13/2018 17:14   Dg Femur Min 2 Views Left  Result Date: 10/13/2018 CLINICAL DATA:  Intramedullary nail left femur. EXAM: LEFT FEMUR 2 VIEWS; DG C-ARM 1-60 MIN COMPARISON:  10/12/2018 FINDINGS: Interval fixation of patient's left femoral trochanteric fracture with intramedullary nail and associated screws bridging the fracture site into the femoral head. Hardware is intact with anatomic alignment over the fracture site. IMPRESSION: Fixation of left intertrochanteric fracture with hardware intact. Electronically Signed   By: Elberta Fortis M.D.   On: 10/13/2018 17:14   Dg Femur Min 2 Views Left  Result Date: 10/12/2018 CLINICAL DATA:  Fall. EXAM: LEFT FEMUR 2 VIEWS COMPARISON:  None. FINDINGS: Minimally displaced fracture is seen involving intertrochanteric region of proximal left femur. Vascular calcifications are noted. Remaining portion of femur is unremarkable. IMPRESSION: Minimally displaced intertrochanteric fracture of proximal left femur. Electronically Signed   By: Lupita Raider M.D.   On: 10/12/2018 16:59    Lab Data:  CBC: Recent Labs  Lab 10/13/18 1019 10/14/18 0733 10/15/18 0556 10/16/18 0412 10/17/18 0335  WBC 9.4 13.5* 10.9* 8.6 8.5  HGB 10.6* 9.3* 7.9* 7.5* 8.1*  HCT 35.4* 31.0* 25.5* 24.2* 26.5*  MCV 73.3* 73.8* 73.7* 74.0* 74.9*  PLT 206 168 144* 130* 141*   Basic Metabolic Panel: Recent Labs  Lab 10/13/18 1019 10/14/18 0733 10/15/18 0556 10/16/18 0412 10/17/18 0335  NA 141 138 144 140 138  K 4.4 5.0 4.4 4.5 4.6  CL 105 105 111 108 104  CO2 27 26 24 24 26   GLUCOSE 84 183*  103* 82 99  BUN 16 19 23* 22* 27*  CREATININE 1.64* 1.89* 2.04* 1.62* 1.59*  CALCIUM 9.0 8.7* 8.4* 8.2* 8.5*   GFR: Estimated Creatinine Clearance: 41.4 mL/min (A) (by C-G formula based on SCr of 1.59 mg/dL (H)). Liver Function Tests: Recent Labs  Lab 10/12/18 0944  AST 28  ALT 32  ALKPHOS 156*  BILITOT 0.2  PROT 5.9*  ALBUMIN 3.5*   No results for input(s):  LIPASE, AMYLASE in the last 168 hours. No results for input(s): AMMONIA in the last 168 hours. Coagulation Profile: No results for input(s): INR, PROTIME in the last 168 hours. Cardiac Enzymes: No results for input(s): CKTOTAL, CKMB, CKMBINDEX, TROPONINI in the last 168 hours. BNP (last 3 results) No results for input(s): PROBNP in the last 8760 hours. HbA1C: No results for input(s): HGBA1C in the last 72 hours. CBG: Recent Labs  Lab 10/16/18 1608 10/16/18 2018 10/17/18 0331 10/17/18 0622 10/17/18 1154  GLUCAP 225* 123* 88 78 92   Lipid Profile: No results for input(s): CHOL, HDL, LDLCALC, TRIG, CHOLHDL, LDLDIRECT in the last 72 hours. Thyroid Function Tests: No results for input(s): TSH, T4TOTAL, FREET4, T3FREE, THYROIDAB in the last 72 hours. Anemia Panel: No results for input(s): VITAMINB12, FOLATE, FERRITIN, TIBC, IRON, RETICCTPCT in the last 72 hours. Urine analysis:    Component Value Date/Time   COLORURINE STRAW (A) 06/25/2018 2131   APPEARANCEUR HAZY (A) 06/25/2018 2131   APPEARANCEUR Cloudy (A) 07/17/2014 1455   LABSPEC 1.003 (L) 06/25/2018 2131   LABSPEC 1.012 12/29/2013 1210   PHURINE 6.0 06/25/2018 2131   GLUCOSEU NEGATIVE 06/25/2018 2131   GLUCOSEU >=500 12/29/2013 1210   HGBUR NEGATIVE 06/25/2018 2131   HGBUR negative 04/14/2010 1435   BILIRUBINUR Negative 07/04/2018 1605   BILIRUBINUR Negative 07/17/2014 1455   BILIRUBINUR Negative 12/29/2013 1210   KETONESUR NEGATIVE 06/25/2018 2131   PROTEINUR Negative 07/04/2018 1605   PROTEINUR NEGATIVE 06/25/2018 2131   UROBILINOGEN 0.2 07/04/2018  1605   UROBILINOGEN negative 04/14/2010 1435   NITRITE Negative 07/04/2018 1605   NITRITE NEGATIVE 06/25/2018 2131   LEUKOCYTESUR Moderate (2+) (A) 07/04/2018 1605   LEUKOCYTESUR NEGATIVE 06/25/2018 2131   LEUKOCYTESUR Negative 12/29/2013 1210     [name] M.D. [facility] 10/17/2018, 3:48 PM   After 7pm go to [email or web address] - password [email or web address]  Call night coverage person covering after 7pm

## 2018-10-18 ENCOUNTER — Telehealth: Payer: Self-pay | Admitting: Cardiovascular Disease

## 2018-10-18 DIAGNOSIS — N189 Chronic kidney disease, unspecified: Secondary | ICD-10-CM | POA: Diagnosis not present

## 2018-10-18 DIAGNOSIS — I10 Essential (primary) hypertension: Secondary | ICD-10-CM | POA: Diagnosis not present

## 2018-10-18 DIAGNOSIS — R52 Pain, unspecified: Secondary | ICD-10-CM | POA: Diagnosis not present

## 2018-10-18 DIAGNOSIS — R2689 Other abnormalities of gait and mobility: Secondary | ICD-10-CM | POA: Diagnosis present

## 2018-10-18 DIAGNOSIS — R2681 Unsteadiness on feet: Secondary | ICD-10-CM | POA: Diagnosis present

## 2018-10-18 DIAGNOSIS — R41841 Cognitive communication deficit: Secondary | ICD-10-CM | POA: Diagnosis present

## 2018-10-18 DIAGNOSIS — R5381 Other malaise: Secondary | ICD-10-CM | POA: Diagnosis not present

## 2018-10-18 DIAGNOSIS — R262 Difficulty in walking, not elsewhere classified: Secondary | ICD-10-CM | POA: Diagnosis present

## 2018-10-18 DIAGNOSIS — M2550 Pain in unspecified joint: Secondary | ICD-10-CM | POA: Diagnosis not present

## 2018-10-18 DIAGNOSIS — N183 Chronic kidney disease, stage 3 (moderate): Secondary | ICD-10-CM | POA: Diagnosis not present

## 2018-10-18 DIAGNOSIS — I6930 Unspecified sequelae of cerebral infarction: Secondary | ICD-10-CM | POA: Diagnosis not present

## 2018-10-18 DIAGNOSIS — Z7401 Bed confinement status: Secondary | ICD-10-CM | POA: Diagnosis not present

## 2018-10-18 DIAGNOSIS — M25552 Pain in left hip: Secondary | ICD-10-CM | POA: Diagnosis not present

## 2018-10-18 DIAGNOSIS — M6281 Muscle weakness (generalized): Secondary | ICD-10-CM | POA: Diagnosis present

## 2018-10-18 DIAGNOSIS — S72145A Nondisplaced intertrochanteric fracture of left femur, initial encounter for closed fracture: Secondary | ICD-10-CM | POA: Diagnosis not present

## 2018-10-18 DIAGNOSIS — Z741 Need for assistance with personal care: Secondary | ICD-10-CM | POA: Diagnosis present

## 2018-10-18 DIAGNOSIS — N2581 Secondary hyperparathyroidism of renal origin: Secondary | ICD-10-CM | POA: Diagnosis not present

## 2018-10-18 DIAGNOSIS — S72142D Displaced intertrochanteric fracture of left femur, subsequent encounter for closed fracture with routine healing: Secondary | ICD-10-CM | POA: Diagnosis not present

## 2018-10-18 DIAGNOSIS — R1312 Dysphagia, oropharyngeal phase: Secondary | ICD-10-CM | POA: Diagnosis present

## 2018-10-18 DIAGNOSIS — D631 Anemia in chronic kidney disease: Secondary | ICD-10-CM | POA: Diagnosis not present

## 2018-10-18 LAB — GLUCOSE, CAPILLARY
Glucose-Capillary: 81 mg/dL (ref 70–99)
Glucose-Capillary: 96 mg/dL (ref 70–99)
Glucose-Capillary: 85 mg/dL (ref 70–99)

## 2018-10-18 MED ORDER — POLYETHYLENE GLYCOL 3350 17 G PO PACK: 17.0000 g | PACK | Freq: Every day | ORAL | 0 refills | Status: DC | PRN

## 2018-10-18 MED ORDER — SENNOSIDES-DOCUSATE SODIUM 8.6-50 MG PO TABS: 2.0000 | ORAL_TABLET | Freq: Every day | ORAL | 1 refills | Status: DC | PRN

## 2018-10-18 MED ORDER — ASPIRIN 325 MG PO TBEC: 325.0000 mg | DELAYED_RELEASE_TABLET | Freq: Every day | ORAL | 0 refills | Status: AC

## 2018-10-18 MED ORDER — DOCUSATE SODIUM 100 MG PO CAPS: 100.0000 mg | ORAL_CAPSULE | Freq: Two times a day (BID) | ORAL | 0 refills | Status: DC

## 2018-10-18 MED ORDER — FERROUS SULFATE 325 (65 FE) MG PO TBEC: 325.0000 mg | DELAYED_RELEASE_TABLET | Freq: Two times a day (BID) | ORAL | 3 refills | Status: DC

## 2018-10-18 MED ORDER — ALPRAZOLAM 1 MG PO TABS: ORAL_TABLET | ORAL | 0 refills | Status: DC

## 2018-10-18 MED ORDER — ENSURE ENLIVE PO LIQD
237.0000 mL | Freq: Once | ORAL | Status: AC
  Administered 2018-10-18: 18:00:00 237 mL via ORAL
  Filled 2018-10-18: qty 237

## 2018-10-18 MED ORDER — SENNA 8.6 MG PO TABS: 2.0000 | ORAL_TABLET | Freq: Every day | ORAL | 0 refills | Status: DC

## 2018-10-18 NOTE — Discharge Summary (Signed)
[name] [address] [name], is a 48 y.o. female  DOB 1970/11/07  MRN [id number].  Admission date:  10/12/2018  Admitting Physician  Anselm Jungling, DO  Discharge Date:  10/18/2018   Primary MD  [name], MD  Recommendations for primary care physician for things to follow:  -Please check CBC, BMP in 3 days to ensure stable renal function and hemoglobin   Admission Diagnosis  Fall [W19.XXXA] Closed displaced intertrochanteric fracture of left femur, initial encounter (HCC) [S72.142A] Chronic kidney disease, unspecified CKD stage [N18.9]   Discharge Diagnosis  Fall [W19.XXXA] Closed displaced intertrochanteric fracture of left femur, initial encounter (HCC) [S72.142A] Chronic kidney disease, unspecified CKD stage [N18.9]    Principal Problem:   Closed nondisplaced intertrochanteric fracture of left femur (HCC) Active Problems:   Depression with anxiety   Hypothyroidism   History of CVA with residual deficit   Femur fracture, left (HCC)   Fall      Past Medical History:  Diagnosis Date   Acute pyelonephritis    Anemia    iron deficiency anemia   Aortic arch aneurysm (HCC)    Bipolar disorder (HCC)    BRCA negative 2014   CAD (coronary artery disease)    a. 08/2003 Cath: LAD 30-40-med Rx; b. 11/2014 PCI: LAD 30m (3.25x23 Xience Alpine DES); c. 06/2015 PCI: D1 (4.09W11 Resolute Integrity DES); d. 06/2017 PCI: Patent mLAD stent, D2 95 (PTCA); e. 09/2017 PCI: D2 99ost (CBA); d. 12/2017 Cath: LM nl, LAD 31m, 80d (small), D1 40ost, D2 95ost, LCX 40p, RCA 40ost/p->Med rx for D2 given restenosis.   CKD (chronic kidney disease), stage III (HCC)    Colon polyp    CVA (cerebral vascular accident) (HCC)    Left side weakness.    Diabetes (HCC)    Family history of breast cancer    BRCA neg 2014   Gastric ulcer 04/27/2011   History of echocardiogram    a. 03/2017 Echo: EF 60-65%, no rwma; b.  02/2018 Echo: EF 60-65%, no rwma. Nl RV fxn. No cardiac source of emboli (admitted w/ stroke).   HTN (hypertension)    Hyperlipemia    Hypothyroid    Malignant melanoma of skin of scalp (HCC)    MI, acute, non ST segment elevation (HCC)    Neuromuscular disorder (HCC)    Orthostatic hypotension    S/P drug eluting coronary stent placement 06/04/2015   Sepsis (HCC) 03/18/2017   Stroke (HCC)    a. 02/2018 MRI: 7mm late acute/early subacute L medial frontal lobe inarct; b. 02/2018 MRA No large vessel occlusion or aneurysm. Mod to sev L P2 stenosis. thready L vertebral artery, diffusely dzs'd; c. 02/2018 Carotid U/S: <50% bilat ICA dzs.    Past Surgical History:  Procedure Laterality Date   APPENDECTOMY     CARDIAC CATHETERIZATION N/A 11/09/2014   Procedure: Coronary Angiography;  Surgeon: Antonieta Iba, MD;  Location: [facility];  Service: Cardiovascular;  Laterality: N/A;   CARDIAC CATHETERIZATION N/A 11/12/2014   Procedure: Coronary Stent  Intervention;  Surgeon: Marcina Millard, MD;  Location: [facility];  Service: Cardiovascular;  Laterality: N/A;   CARDIAC CATHETERIZATION N/A 04/18/2015   Procedure: Left Heart Cath and Coronary Angiography;  Surgeon: Antonieta Iba, MD;  Location: [facility];  Service: Cardiovascular;  Laterality: N/A;   CARDIAC CATHETERIZATION Left 06/04/2015   Procedure: Left Heart Cath and Coronary Angiography;  Surgeon: Iran Ouch, MD;  Location: [facility];  Service: Cardiovascular;  Laterality: Left;   CARDIAC CATHETERIZATION N/A 06/04/2015   Procedure: Coronary Stent Intervention;  Surgeon: Iran Ouch, MD;  Location: [facility];  Service: Cardiovascular;  Laterality: N/A;   CESAREAN SECTION  2001   CHOLECYSTECTOMY N/A 11/18/2016   Procedure: LAPAROSCOPIC CHOLECYSTECTOMY WITH INTRAOPERATIVE CHOLANGIOGRAM;  Surgeon: Kieth Brightly, MD;  Location: [facility];  Service: General;   Laterality: N/A;   COLONOSCOPY WITH PROPOFOL N/A 04/27/2016   Procedure: COLONOSCOPY WITH PROPOFOL;  Surgeon: Midge Minium, MD;  Location: [facility];  Service: Endoscopy;  Laterality: N/A;   COLONOSCOPY WITH PROPOFOL N/A 01/12/2018   Procedure: COLONOSCOPY WITH PROPOFOL;  Surgeon: Toledo, Boykin Nearing, MD;  Location: [facility];  Service: Endoscopy;  Laterality: N/A;   CORONARY ANGIOPLASTY     CORONARY BALLOON ANGIOPLASTY N/A 06/29/2017   Procedure: CORONARY BALLOON ANGIOPLASTY;  Surgeon: Iran Ouch, MD;  Location: [facility];  Service: Cardiovascular;  Laterality: N/A;   CORONARY BALLOON ANGIOPLASTY N/A 09/20/2017   Procedure: CORONARY BALLOON ANGIOPLASTY;  Surgeon: Iran Ouch, MD;  Location: [facility];  Service: Cardiovascular;  Laterality: N/A;   DILATION AND CURETTAGE OF UTERUS     ESOPHAGOGASTRODUODENOSCOPY (EGD) WITH PROPOFOL N/A 09/14/2014   Procedure: ESOPHAGOGASTRODUODENOSCOPY (EGD) WITH PROPOFOL;  Surgeon: Elnita Maxwell, MD;  Location: [facility];  Service: Endoscopy;  Laterality: N/A;   ESOPHAGOGASTRODUODENOSCOPY (EGD) WITH PROPOFOL N/A 04/27/2016   Procedure: ESOPHAGOGASTRODUODENOSCOPY (EGD) WITH PROPOFOL;  Surgeon: Midge Minium, MD;  Location: [facility];  Service: Endoscopy;  Laterality: N/A;  Diabetic - oral meds   ESOPHAGOGASTRODUODENOSCOPY (EGD) WITH PROPOFOL N/A 01/12/2018   Procedure: ESOPHAGOGASTRODUODENOSCOPY (EGD) WITH PROPOFOL;  Surgeon: Toledo, Boykin Nearing, MD;  Location: [facility];  Service: Endoscopy;  Laterality: N/A;   GASTRIC BYPASS  09/2009   [facility]    INTRAMEDULLARY (IM) NAIL INTERTROCHANTERIC Left 10/13/2018   Procedure: INTRAMEDULLARY (IM) NAIL INTERTROCHANTRIC;  Surgeon: Tarry Kos, MD;  Location: [facility];  Service: Orthopedics;  Laterality: Left;   Left Carotid to sublcavian artery bypass w/ subclavian artery ligation     a. Performed @ [facility].   LEFT HEART CATH  AND CORONARY ANGIOGRAPHY Left 06/29/2017   Procedure: LEFT HEART CATH AND CORONARY ANGIOGRAPHY;  Surgeon: Iran Ouch, MD;  Location: [facility];  Service: Cardiovascular;  Laterality: Left;   LEFT HEART CATH AND CORONARY ANGIOGRAPHY N/A 09/20/2017   Procedure: LEFT HEART CATH AND CORONARY ANGIOGRAPHY;  Surgeon: Iran Ouch, MD;  Location: [facility];  Service: Cardiovascular;  Laterality: N/A;   LEFT HEART CATH AND CORONARY ANGIOGRAPHY N/A 12/20/2017   Procedure: LEFT HEART CATH AND CORONARY ANGIOGRAPHY;  Surgeon: Iran Ouch, MD;  Location: [facility];  Service: Cardiovascular;  Laterality: N/A;   MELANOMA EXCISION  2016   Dr. Marinell Blight ABLATION  2002   RIGHT OOPHORECTOMY     SHOULDER ARTHROSCOPY WITH OPEN ROTATOR CUFF REPAIR Right 01/07/2016   Procedure: SHOULDER ARTHROSCOPY WITH DEBRIDMENT, SUBACHROMIAL  DECOMPRESSION;  Surgeon: Christena Flake, MD;  Location: [facility];  Service: Orthopedics;  Laterality: Right;   SHOULDER ARTHROSCOPY WITH OPEN ROTATOR CUFF REPAIR Right 03/16/2017   Procedure: SHOULDER ARTHROSCOPY WITH OPEN ROTATOR CUFF REPAIR POSSIBLE BICEPS TENODESIS;  Surgeon: Christena Flake, MD;  Location: [facility];  Service: Orthopedics;  Laterality: Right;   TRIGGER FINGER RELEASE Right     Middle Finger       History of present illness and  Hospital Course:     Kindly see H&P for history of present illness and admission details, please review complete Labs, Consult reports and Test reports for all details in brief  HPI  from the history and physical done on the day of admission 10/13/2018  HPI: [name] is a 48 y.o. female with medical history significant of history of CVA, multiple sclerosis, chronic systolic CHF hypertension, type 2 diabetes, stage III chronic kidney disease hyperlipidemia, depression who presented to the emergency department status post a mechanical fall.  Patient endorsed that she normally  has an unsteady gait and has frequent falls.  Today while climbing up one step of stairs she lost her balance and fell on her left side and also hit her head.  Denies any dizziness, lightheadedness or any loss of consciousness.  Denies any chest pain, shortness of breath, abdominal pain.    ED Course: She was afebrile and hypertensive at times up to 190s on room air.  CBC showed no leukocytosis or anemia.  BMP otherwise unremarkable.  Negative CT cervical spine.Negative CT head.Left femur x-ray showed a minimally displaced intertrochanteric fracture of the proximal left femur.  Hospital Course     Closed nondisplaced intertrochanteric fracture of left femur (HCC) secondary to mechanical fall -Orthopedics was consulted, patient underwent ORIF with intramedullary implant on 10/13/2018, - pain control, DVT prophylaxis per orthopedics -Per Ortho, DVT prophylaxis aspirin 325 mg x 6 weeks in addition to her home dose Plavix -WBAT LLE, follow-up with Dr. Roda Shutters in 2 weeks -PT consulted, recommendation for SNF placement   History of CVA -Restart Plavix, continue statin    Depression with anxiety -Continue Wellbutrin, Effexor, Xanax prn  Diabetes mellitus type 2, NIDDM, with neuropathy -Continue sliding scale insulin, hemoglobin A1c 5.4   Hyperlipidemia -Continue Crest  Hypothyroidism -Continue levothyroxine  Iron deficiency anemia, chronic gastritis.  Acute blood loss anemia -Continue PPI -Baseline hemoglobin ~10, dropped during hospital stay, it is 8.1 on discharge, she was started on iron supplements on discharge  Hypertension Continue metoprolol, Imdur, losartan  Recent diagnosis of H. pylori gastritis -Patient has been following with her PCP closely, had chronic epigastric pain, nausea and vomiting.  Patient was found to have H. pylori gastritis -Ultrasound abdomen was unremarkable. -Patient was started on amoxicillin 1000 mg twice daily and Biaxin 500 mg twice a day for 14  days.  -Continue PPI Patient was seen by her PCP on 9/9, was recommended to finish the regimen and repeat H. pylori breath test after she has completed the therapy.   History of IBS -Placed on stool softeners, follow closely  CKD stage III -Baseline creatinine appears to be 1.5-1.7 -Presented with creatinine of 1.8  Discharge Condition: Stable   Follow UP  Follow-up Information    Tarry Kos, MD In 2 weeks.   Specialty: Orthopedic Surgery Why: For suture removal, For wound re-check Contact information: [address] [place] [address] [phone]             Discharge Instructions  and  Discharge Medications   Discharge Instructions    Discharge instructions   Complete by: As directed    Follow with Primary MD [name]  Get CBC, CMP,  checked  by Primary MD next visit.   Disposition SNF   Diet: Heart Healthy , with feeding assistance and aspiration precautions.  For Heart failure patients - Check your Weight same time everyday, if you gain over 2 pounds, or you develop in leg swelling, experience more shortness of breath or chest pain, call your Primary MD immediately. Follow Cardiac Low Salt Diet and 1.5 lit/day fluid restriction.   On your next visit with your primary care physician please Get Medicines reviewed and adjusted.   Please request your Prim.MD to go over all Hospital Tests and Procedure/Radiological results at the follow up, please get all Hospital records sent to your Prim MD by signing hospital release before you go home.   If you experience worsening of your admission symptoms, develop shortness of breath, life threatening emergency, suicidal or homicidal thoughts you must seek medical attention immediately by calling 911 or calling your MD immediately  if symptoms less severe.  You Must read complete instructions/literature along with all the possible adverse reactions/side effects for all the Medicines you take  and that have been prescribed to you. Take any new Medicines after you have completely understood and accpet all the possible adverse reactions/side effects.   Do not drive, operating heavy machinery, perform activities at heights, swimming or participation in water activities or provide baby sitting services if your were admitted for syncope or siezures until you have seen by Primary MD or a Neurologist and advised to do so again.  Do not drive when taking Pain medications.    Do not take more than prescribed Pain, Sleep and Anxiety Medications  Special Instructions: If you have smoked or chewed Tobacco  in the last 2 yrs please stop smoking, stop any regular Alcohol  and or any Recreational drug use.  Wear Seat belts while driving.   Please note  You were cared for by a hospitalist during your hospital stay. If you have any questions about your discharge medications or the care you received while you were in the hospital after you are discharged, you can call the unit and asked to speak with the hospitalist on call if the hospitalist that took care of you is not available. Once you are discharged, your primary care physician will handle any further medical issues. Please note that NO REFILLS for any discharge medications will be authorized once you are discharged, as it is imperative that you return to your primary care physician (or establish a relationship with a primary care physician if you do not have one) for your aftercare needs so that they can reassess your need for medications and monitor your lab values.   Increase activity slowly   Complete by: As directed    Weight bearing as tolerated   Complete by: As directed      Allergies as of 10/18/2018      Reactions   Lipitor [atorvastatin] Other (See Comments)   Leg pains   Tramadol Other (See Comments)   Mouth feels like it's on fire   Lactose Intolerance (gi) Nausea Only      Medication List    STOP taking these medications    amoxicillin 500 MG capsule Commonly known as: AMOXIL   clarithromycin 500 MG tablet Commonly known as: BIAXIN     TAKE these  medications   ALPRAZolam 1 MG tablet Commonly known as: XANAX TAKE 1 TABLET BY MOUTH EVERY DAY AS NEEDED FOR ANXIETY What changed: See the new instructions.   aspirin 325 MG EC tablet Take 1 tablet (325 mg total) by mouth daily. Please take for 6 weeks and then stop Start taking on: October 19, 2018   buPROPion 150 MG 24 hr tablet Commonly known as: WELLBUTRIN XL TAKE 1 TABLET BY MOUTH EVERY DAY   clopidogrel 75 MG tablet Commonly known as: PLAVIX Take 1 tablet (75 mg total) by mouth daily.   diclofenac sodium 1 % Gel Commonly known as: Voltaren Apply 2 g topically 3 (three) times daily as needed.   diphenoxylate-atropine 2.5-0.025 MG tablet Commonly known as: LOMOTIL Take one capsule 1-4 times daily as needed for diarrhea   docusate sodium 100 MG capsule Commonly known as: COLACE Take 1 capsule (100 mg total) by mouth 2 (two) times daily.   ferrous sulfate 325 (65 FE) MG EC tablet Take 1 tablet (325 mg total) by mouth 2 (two) times daily.   gabapentin 300 MG capsule Commonly known as: NEURONTIN Take 1 capsule (300 mg total) by mouth 2 (two) times daily.   isosorbide mononitrate 30 MG 24 hr tablet Commonly known as: IMDUR Take 1 tablet (30 mg total) by mouth daily.   levothyroxine 25 MCG tablet Commonly known as: SYNTHROID Take 1 tablet (25 mcg total) by mouth daily before breakfast.   losartan 25 MG tablet Commonly known as: COZAAR Take 25 mg by mouth daily.   metoprolol succinate 25 MG 24 hr tablet Commonly known as: Toprol XL Take 0.5 tablets (12.5 mg total) by mouth daily.   nitroGLYCERIN 0.4 MG SL tablet Commonly known as: NITROSTAT Place 1 tablet (0.4 mg total) under the tongue every 5 (five) minutes as needed for chest pain.   ondansetron 4 MG tablet Commonly known as: ZOFRAN Take 1 tablet (4 mg total) by mouth every  8 (eight) hours as needed.   ONE TOUCH ULTRA 2 w/Device Kit Use to check blood sugar once a day. Dx. E11.9   oxyCODONE-acetaminophen 5-325 MG tablet Commonly known as: Percocet Take 1-2 tablets by mouth every 8 (eight) hours as needed for severe pain.   pantoprazole 40 MG tablet Commonly known as: PROTONIX Take 1 tablet (40 mg total) by mouth 2 (two) times daily.   polyethylene glycol 17 g packet Commonly known as: MIRALAX / GLYCOLAX Take 17 g by mouth daily as needed for mild constipation.   promethazine 25 MG tablet Commonly known as: PHENERGAN Take 25 mg by mouth every 6 (six) hours as needed for nausea or vomiting.   rosuvastatin 40 MG tablet Commonly known as: CRESTOR Take 1 tablet (40 mg total) by mouth daily.   senna 8.6 MG Tabs tablet Commonly known as: SENOKOT Take 2 tablets (17.2 mg total) by mouth at bedtime.   senna-docusate 8.6-50 MG tablet Commonly known as: Senokot-S Take 2 tablets by mouth daily as needed for mild constipation.   tiZANidine 4 MG tablet Commonly known as: ZANAFLEX Take 4 mg by mouth daily as needed for pain.   venlafaxine XR 75 MG 24 hr capsule Commonly known as: EFFEXOR-XR Take 1 capsule (75 mg total) by mouth daily with breakfast.            Discharge Care Instructions  (From admission, onward)         Start     Ordered   10/13/18 0000  Weight bearing as tolerated  10/13/18 1535            Diet and Activity recommendation: See Discharge Instructions above   Consults obtained - Orthopedic   Major procedures and Radiology Reports - PLEASE review detailed and final reports for all details, in brief -      Dg Chest 1 View  Result Date: 10/12/2018 CLINICAL DATA:  Fall. EXAM: CHEST  1 VIEW COMPARISON:  Radiograph Jun 25, 2018. FINDINGS: Stable cardiomediastinal silhouette. No pneumothorax or pleural effusion is noted. Both lungs are clear. The visualized skeletal structures are unremarkable. IMPRESSION: No active  disease. Electronically Signed   By: Lupita Raider M.D.   On: 10/12/2018 17:01   Dg Pelvis 1-2 Views  Result Date: 10/12/2018 CLINICAL DATA:  Fall. EXAM: PELVIS - 1-2 VIEW COMPARISON:  None. FINDINGS: Minimally displaced fracture is seen involving the intertrochanteric region of the proximal left femur. Right hip is unremarkable. Pelvis appears normal. IMPRESSION: Minimally displaced intertrochanteric fracture of the proximal left femur. Electronically Signed   By: Lupita Raider M.D.   On: 10/12/2018 16:58   Ct Head Wo Contrast  Result Date: 10/12/2018 CLINICAL DATA:  Status post fall backwards.  Hit her head on a car. EXAM: CT HEAD WITHOUT CONTRAST CT CERVICAL SPINE WITHOUT CONTRAST TECHNIQUE: Multidetector CT imaging of the head and cervical spine was performed following the standard protocol without intravenous contrast. Multiplanar CT image reconstructions of the cervical spine were also generated. COMPARISON:  06/05/2012 FINDINGS: CT HEAD FINDINGS Brain: No evidence of acute infarction, hemorrhage, hydrocephalus, extra-axial collection or mass lesion/mass effect. Old right basal ganglia lacunar infarct. Periventricular white matter low attenuation as can be seen with microvascular disease. Mild generalized cerebral atrophy. Vascular: No hyperdense vessel. Intracranial atherosclerotic disease. Skull: No osseous abnormality. Sinuses/Orbits: Visualized paranasal sinuses are clear. Visualized mastoid sinuses are clear. Visualized orbits demonstrate no focal abnormality. Other: None CT CERVICAL SPINE FINDINGS Alignment: Normal. Skull base and vertebrae: No acute fracture. No primary bone lesion or focal pathologic process. Soft tissues and spinal canal: No prevertebral fluid or swelling. No visible canal hematoma. Disc levels:  Disc spaces are maintained.  No foraminal stenosis. Upper chest: Lung apices are clear. Right-sided aortic arch with ligation of the left subclavian artery. Other: No fluid  collection or hematoma. IMPRESSION: 1. No acute intracranial pathology. 2.  No acute osseous injury of the cervical spine. Electronically Signed   By: Elige Ko   On: 10/12/2018 17:17   Ct Cervical Spine Wo Contrast  Result Date: 10/12/2018 CLINICAL DATA:  Status post fall backwards.  Hit her head on a car. EXAM: CT HEAD WITHOUT CONTRAST CT CERVICAL SPINE WITHOUT CONTRAST TECHNIQUE: Multidetector CT imaging of the head and cervical spine was performed following the standard protocol without intravenous contrast. Multiplanar CT image reconstructions of the cervical spine were also generated. COMPARISON:  06/05/2012 FINDINGS: CT HEAD FINDINGS Brain: No evidence of acute infarction, hemorrhage, hydrocephalus, extra-axial collection or mass lesion/mass effect. Old right basal ganglia lacunar infarct. Periventricular white matter low attenuation as can be seen with microvascular disease. Mild generalized cerebral atrophy. Vascular: No hyperdense vessel. Intracranial atherosclerotic disease. Skull: No osseous abnormality. Sinuses/Orbits: Visualized paranasal sinuses are clear. Visualized mastoid sinuses are clear. Visualized orbits demonstrate no focal abnormality. Other: None CT CERVICAL SPINE FINDINGS Alignment: Normal. Skull base and vertebrae: No acute fracture. No primary bone lesion or focal pathologic process. Soft tissues and spinal canal: No prevertebral fluid or swelling. No visible canal hematoma. Disc levels:  Disc spaces are maintained.  No foraminal stenosis. Upper chest: Lung apices are clear. Right-sided aortic arch with ligation of the left subclavian artery. Other: No fluid collection or hematoma. IMPRESSION: 1. No acute intracranial pathology. 2.  No acute osseous injury of the cervical spine. Electronically Signed   By: Elige Ko   On: 10/12/2018 17:17   US Abdomen Complete  Result Date: 10/05/2018 CLINICAL DATA:  Nausea and vomiting EXAM: ABDOMEN ULTRASOUND COMPLETE COMPARISON:  03/19/2017  renal ultrasound, CT 01/27/2017 FINDINGS: Gallbladder: Surgically absent Common bile duct: Diameter: 3.4 mm Liver: No focal lesion identified. Within normal limits in parenchymal echogenicity. Portal vein is patent on color Doppler imaging with normal direction of blood flow towards the liver. IVC: No abnormality visualized. Pancreas: Not well seen due to bowel gas Spleen: Size and appearance within normal limits. Right Kidney: Length: 8 cm. Echogenicity within normal limits. No mass or hydronephrosis visualized. Left Kidney: Length: 10.2 cm. Echogenicity within normal limits. No mass or hydronephrosis visualized. Abdominal aorta: No aneurysm visualized.  Maximum diameter 2.5 cm Other findings: None. IMPRESSION: 1. Status post cholecystectomy without biliary dilatation 2. Slight asymmetric size of kidneys right smaller than left but kidneys otherwise appear morphologically normal. Electronically Signed   By: Jasmine Pang M.D.   On: 10/05/2018 13:39   Dg Shoulder Left  Result Date: 10/12/2018 CLINICAL DATA:  Status post fall backwards. EXAM: LEFT SHOULDER - 2+ VIEW COMPARISON:  None. FINDINGS: There is no fracture or dislocation. There is minimal osteoarthritis of the left glenohumeral joint. There are mild degenerative changes of the acromioclavicular joint. IMPRESSION: No acute osseous injury of the left shoulder. Electronically Signed   By: Elige Ko   On: 10/12/2018 16:59   Dg C-arm 1-60 Min  Result Date: 10/13/2018 CLINICAL DATA:  Intramedullary nail left femur. EXAM: LEFT FEMUR 2 VIEWS; DG C-ARM 1-60 MIN COMPARISON:  10/12/2018 FINDINGS: Interval fixation of patient's left femoral trochanteric fracture with intramedullary nail and associated screws bridging the fracture site into the femoral head. Hardware is intact with anatomic alignment over the fracture site. IMPRESSION: Fixation of left intertrochanteric fracture with hardware intact. Electronically Signed   By: Elberta Fortis M.D.   On:  10/13/2018 17:14   Dg Femur Min 2 Views Left  Result Date: 10/13/2018 CLINICAL DATA:  Intramedullary nail left femur. EXAM: LEFT FEMUR 2 VIEWS; DG C-ARM 1-60 MIN COMPARISON:  10/12/2018 FINDINGS: Interval fixation of patient's left femoral trochanteric fracture with intramedullary nail and associated screws bridging the fracture site into the femoral head. Hardware is intact with anatomic alignment over the fracture site. IMPRESSION: Fixation of left intertrochanteric fracture with hardware intact. Electronically Signed   By: Elberta Fortis M.D.   On: 10/13/2018 17:14   Dg Femur Min 2 Views Left  Result Date: 10/12/2018 CLINICAL DATA:  Fall. EXAM: LEFT FEMUR 2 VIEWS COMPARISON:  None. FINDINGS: Minimally displaced fracture is seen involving intertrochanteric region of proximal left femur. Vascular calcifications are noted. Remaining portion of femur is unremarkable. IMPRESSION: Minimally displaced intertrochanteric fracture of proximal left femur. Electronically Signed   By: Lupita Raider M.D.   On: 10/12/2018 16:59    Micro Results     Recent Results (from the past 240 hour(s))  SARS Coronavirus 2 [facility] order, Performed in [facility] hospital lab) Nasopharyngeal Nasopharyngeal Swab     Status: None   Collection Time: 10/12/18  6:00 PM   Specimen: Nasopharyngeal Swab  Result Value Ref Range Status   SARS Coronavirus 2 NEGATIVE NEGATIVE Final  Comment: (NOTE) If result is NEGATIVE SARS-CoV-2 target nucleic acids are NOT DETECTED. The SARS-CoV-2 RNA is generally detectable in upper and lower  respiratory specimens during the acute phase of infection. The lowest  concentration of SARS-CoV-2 viral copies this assay can detect is 250  copies / mL. A negative result does not preclude SARS-CoV-2 infection  and should not be used as the sole basis for treatment or other  patient management decisions.  A negative result may occur with  improper specimen collection / handling, submission  of specimen other  than nasopharyngeal swab, presence of viral mutation(s) within the  areas targeted by this assay, and inadequate number of viral copies  (<250 copies / mL). A negative result must be combined with clinical  observations, patient history, and epidemiological information. If result is POSITIVE SARS-CoV-2 target nucleic acids are DETECTED. The SARS-CoV-2 RNA is generally detectable in upper and lower  respiratory specimens dur ing the acute phase of infection.  Positive  results are indicative of active infection with SARS-CoV-2.  Clinical  correlation with patient history and other diagnostic information is  necessary to determine patient infection status.  Positive results do  not rule out bacterial infection or co-infection with other viruses. If result is PRESUMPTIVE POSTIVE SARS-CoV-2 nucleic acids MAY BE PRESENT.   A presumptive positive result was obtained on the submitted specimen  and confirmed on repeat testing.  While 2019 novel coronavirus  (SARS-CoV-2) nucleic acids may be present in the submitted sample  additional confirmatory testing may be necessary for epidemiological  and / or clinical management purposes  to differentiate between  SARS-CoV-2 and other Sarbecovirus currently known to infect humans.  If clinically indicated additional testing with an alternate test  methodology [phone]) is advised. The SARS-CoV-2 RNA is generally  detectable in upper and lower respiratory sp ecimens during the acute  phase of infection. The expected result is Negative. Fact Sheet for Patients:  BoilerBrush.com.cy Fact Sheet for Healthcare Providers: https://pope.com/ This test is not yet approved or cleared by the [place] FDA and has been authorized for detection and/or diagnosis of SARS-CoV-2 by FDA under an Emergency Use Authorization (EUA).  This EUA will remain in effect (meaning this test can be used) for  the duration of the COVID-19 declaration under Section 564(b)(1) of the Act, 21 U.S.C. section 360bbb-3(b)(1), unless the authorization is terminated or revoked sooner. Performed at [facility], [address], [place], [place] [address]   Surgical pcr screen     Status: None   Collection Time: 10/13/18 12:08 AM   Specimen: Nasal Mucosa; Nasal Swab  Result Value Ref Range Status   MRSA, PCR NEGATIVE NEGATIVE Final   Staphylococcus aureus NEGATIVE NEGATIVE Final    Comment: (NOTE) The Xpert SA Assay (FDA approved for NASAL specimens in patients 74 years of age and older), is one component of a comprehensive surveillance program. It is not intended to diagnose infection nor to guide or monitor treatment. Performed at [facility], [address] [place], [place] [address]   Novel Coronavirus, NAA (hospital order; send-out to ref lab)     Status: None   Collection Time: 10/15/18  6:37 PM   Specimen: Nasopharyngeal Swab; Respiratory  Result Value Ref Range Status   SARS-CoV-2, NAA NOT DETECTED NOT DETECTED Final    Comment: (NOTE) This nucleic acid amplification test was developed and its performance characteristics determined by [employer]. Nucleic acid amplification tests include PCR and TMA. This test has  not been FDA cleared or approved. This test has been authorized by FDA under an Emergency Use Authorization (EUA). This test is only authorized for the duration of time the declaration that circumstances exist justifying the authorization of the emergency use of in vitro diagnostic tests for detection of SARS-CoV-2 virus and/or diagnosis of COVID-19 infection under section 564(b)(1) of the Act, 21 U.S.C. 098JXB-1(Y) (1), unless the authorization is terminated or revoked sooner. When diagnostic testing is negative, the possibility of a false negative result should be considered in the context of a patient's recent exposures and the presence of clinical  signs and symptoms consistent with COVID-19. An individual without symptoms of COVID- 19 and who is not shedding SARS-CoV-2 vi rus would expect to have a negative (not detected) result in this assay. Performed At: [facility] [address] [place], [place] [id number] Jolene Schimke MD YQ:[id number]    Coronavirus Source NASOPHARYNGEAL  Final    Comment: Performed at [facility], [address] [place], [place] [address]       Today   Subjective:   [address] [name] today has no headache,no chest or  abdominal pain,no new weakness tingling or numbness, feels much better  today.   Objective:   Blood pressure (!) 159/99, pulse 80, temperature 98.3 F (36.8 C), temperature source Oral, resp. rate 16, height 5\' 2"  (1.575 m), weight 78 kg, SpO2 100 %.   Intake/Output Summary (Last 24 hours) at 10/18/2018 1134 Last data filed at 10/18/2018 0900 Gross per 24 hour  Intake 240 ml  Output 1300 ml  Net -1060 ml    Exam Awake Alert, Oriented x 3, No new F.N deficits, Normal affect Symmetrical Chest wall movement, Good air movement bilaterally, CTAB RRR,No Gallops,Rubs or new Murmurs, No Parasternal Heave +ve B.Sounds, Abd Soft, Non tender, No rebound -guarding or rigidity. No Cyanosis, Clubbing or edema, No new Rash or bruise  Data Review   CBC w Diff:  Lab Results  Component Value Date   WBC 8.5 10/17/2018   HGB 8.1 (L) 10/17/2018   HGB 10.4 (L) 10/12/2018   HCT 26.5 (L) 10/17/2018   HCT 33.4 (L) 10/12/2018   PLT 141 (L) 10/17/2018   PLT 216 10/12/2018   LYMPHOPCT 16 06/25/2018   LYMPHOPCT 6.1 12/30/2013   MONOPCT 5 06/25/2018   MONOPCT 1.1 12/30/2013   EOSPCT 3 06/25/2018   EOSPCT 0.1 12/30/2013   BASOPCT 1 06/25/2018   BASOPCT 0.2 12/30/2013    CMP:  Lab Results  Component Value Date   NA 138 10/17/2018   NA 141 10/12/2018   NA 136 12/30/2013   K 4.6 10/17/2018   K 4.8 12/30/2013   CL 104 10/17/2018   CL 107 12/30/2013   CO2 26 10/17/2018    CO2 23 12/30/2013   BUN 27 (H) 10/17/2018   BUN 17 10/12/2018   BUN 23 (H) 12/30/2013   CREATININE 1.59 (H) 10/17/2018   CREATININE 1.92 (H) 10/14/2016   GLU 189 02/03/2013   PROT 5.9 (L) 10/12/2018   PROT 7.1 12/30/2013   ALBUMIN 3.5 (L) 10/12/2018   ALBUMIN 3.5 12/30/2013   BILITOT 0.2 10/12/2018   BILITOT 0.2 12/30/2013   ALKPHOS 156 (H) 10/12/2018   ALKPHOS 87 12/30/2013   AST 28 10/12/2018   AST 14 (L) 12/30/2013   ALT 32 10/12/2018   ALT 33 12/30/2013  .   Total Time in preparing paper work, data evaluation and todays exam - 35 minutes  Luiz Trumpower M.D  on 10/18/2018 at 11:34 AM  [facility]   Office  [phone]

## 2018-10-18 NOTE — Telephone Encounter (Signed)
Spoke with patient and she is in the hospital due to fall and she broke her left hip. She is being discharged to rehab today and reports that she is now getting her medications that she needed for elevated blood pressures. She did not want to make follow up appointment until after she gets out of rehab and is going to stay with her mother once discharged from there. She was appreciative for the call back with no further questions at this time.

## 2018-10-18 NOTE — Telephone Encounter (Signed)
Patient calling  States the nurse at hospital has now seen that patient should be taking IMDUR and will be giving to patient  Please call to discuss to clarify

## 2018-10-18 NOTE — Progress Notes (Signed)
OT Cancellation Note  Patient Details Name: [name] MRN: [id number] DOB: 1970/08/10   Cancelled Treatment:     Pt declined OT session this morning stating,"she's about to leave and doesn't need OT." Will check back as time allows.   Roberts, [place] [facility] [place] 10/18/2018, 10:51 AM

## 2018-10-18 NOTE — Progress Notes (Signed)
Pt to be discharged. I removed her IV. I gave her meds per [facility], discussed them with her. She refused lactulose this AM due to "I already had two good BMs here. I have IBS and don't want to have diarrhea. I had one this morning".   I called report to SNF [phone]). Pt aware, awaiting PTAR ride.

## 2018-10-18 NOTE — Telephone Encounter (Signed)
Pt c/o medication issue:  1. Name of Medication: Imdur   2. How are you currently taking this medication (dosage and times per day)? 30 mg po q d   3. Are you having a reaction (difficulty breathing--STAT)?  No   4. What is your medication issue? Patient admitted [facility] for hip fx and says they are concerned about her elevated BP but have not been giving her the Imdur she was taking at home.  Patient thinks this is part of the HTN issue and would like Dr. [name] to advocate for her to be able to start taking

## 2018-10-18 NOTE — TOC Transition Note (Signed)
Transition of Care [facility]) - CM/SW Discharge Note   Patient Details  Name: Elayne Gruver MRN: [id number] Date of Birth: 1970/07/15  Transition of Care [facility]) CM/SW Contact:  Weston Anna, LCSW Phone Number: 10/18/2018, 2:29 PM   Clinical Narrative:     CSW spoke with patient at bedside to notify her of discharge to [place] [facility] [place] [facility]. Patient agreeable to discharge today. No concerns at this time. PTAR scheduled for 4PM. Please call report to [phone]  Final next level of care: [place] Barriers to Discharge: No Barriers Identified   Patient Goals and CMS Choice Patient states their goals for this hospitalization and ongoing recovery are:: Pt is deciding between SNF and home with home health CMS Medicare.gov Compare Post Acute Care list provided to:: Patient Choice offered to / list presented to : Patient  Discharge Placement              Patient chooses bed at: Other - please specify in the comment section below:([facility]) Patient to be transferred to facility by: PTAR   Patient and family notified of of transfer: 10/18/18  Discharge Plan and Services In-house Referral: Clinical Social Work Discharge Planning Services: NA Post Acute Care Choice: [place]          DME Arranged: N/A DME Agency: NA       HH Arranged: NA [place] Agency: NA        Social Determinants of Health (SDOH) Interventions     Readmission Risk Interventions Readmission Risk Prevention Plan 06/26/2018 06/16/2018  Transportation Screening Complete Complete  Medication Review Press photographer) Complete Complete  PCP or Specialist appointment within 3-5 days of discharge - Complete  HRI or Home Care Consult Complete Complete  SW Recovery Care/Counseling Consult - Not Complete  SW Consult Not Complete Comments - NA  Palliative Care Screening - Not [place] - Not Applicable  Some recent data  might be hidden

## 2018-10-20 DIAGNOSIS — N183 Chronic kidney disease, stage 3 (moderate): Secondary | ICD-10-CM | POA: Diagnosis not present

## 2018-10-20 DIAGNOSIS — I10 Essential (primary) hypertension: Secondary | ICD-10-CM | POA: Diagnosis not present

## 2018-10-20 DIAGNOSIS — N2581 Secondary hyperparathyroidism of renal origin: Secondary | ICD-10-CM | POA: Diagnosis not present

## 2018-10-20 DIAGNOSIS — D631 Anemia in chronic kidney disease: Secondary | ICD-10-CM | POA: Diagnosis not present

## 2018-10-21 DIAGNOSIS — R2681 Unsteadiness on feet: Secondary | ICD-10-CM | POA: Diagnosis not present

## 2018-10-21 DIAGNOSIS — R262 Difficulty in walking, not elsewhere classified: Secondary | ICD-10-CM | POA: Diagnosis not present

## 2018-10-21 DIAGNOSIS — M6281 Muscle weakness (generalized): Secondary | ICD-10-CM | POA: Diagnosis not present

## 2018-10-21 DIAGNOSIS — S72142D Displaced intertrochanteric fracture of left femur, subsequent encounter for closed fracture with routine healing: Secondary | ICD-10-CM | POA: Diagnosis not present

## 2018-10-24 DIAGNOSIS — R262 Difficulty in walking, not elsewhere classified: Secondary | ICD-10-CM | POA: Diagnosis not present

## 2018-10-24 DIAGNOSIS — R2681 Unsteadiness on feet: Secondary | ICD-10-CM | POA: Diagnosis not present

## 2018-10-24 DIAGNOSIS — S72142D Displaced intertrochanteric fracture of left femur, subsequent encounter for closed fracture with routine healing: Secondary | ICD-10-CM | POA: Diagnosis not present

## 2018-10-24 DIAGNOSIS — M6281 Muscle weakness (generalized): Secondary | ICD-10-CM | POA: Diagnosis not present

## 2018-10-25 DIAGNOSIS — S72142D Displaced intertrochanteric fracture of left femur, subsequent encounter for closed fracture with routine healing: Secondary | ICD-10-CM | POA: Diagnosis not present

## 2018-10-25 DIAGNOSIS — R52 Pain, unspecified: Secondary | ICD-10-CM | POA: Diagnosis not present

## 2018-10-25 DIAGNOSIS — R262 Difficulty in walking, not elsewhere classified: Secondary | ICD-10-CM | POA: Diagnosis not present

## 2018-10-27 ENCOUNTER — Encounter: Payer: Self-pay | Admitting: Orthopaedic Surgery

## 2018-10-27 ENCOUNTER — Other Ambulatory Visit: Payer: Self-pay

## 2018-10-27 ENCOUNTER — Ambulatory Visit (INDEPENDENT_AMBULATORY_CARE_PROVIDER_SITE_OTHER): Payer: Medicare Other | Admitting: Orthopaedic Surgery

## 2018-10-27 ENCOUNTER — Ambulatory Visit: Payer: Self-pay

## 2018-10-27 DIAGNOSIS — M25552 Pain in left hip: Secondary | ICD-10-CM | POA: Diagnosis not present

## 2018-10-27 MED ORDER — IBUPROFEN 800 MG PO TABS: 800.0000 mg | ORAL_TABLET | Freq: Two times a day (BID) | ORAL | 2 refills | Status: DC | PRN

## 2018-10-27 NOTE — Progress Notes (Signed)
Post-Op Visit Note   Patient: [name]           Date of Birth: [birth year]           MRN: [id number] Visit Date: 10/27/2018 PCP: [name], MD   Assessment & Plan:  Chief Complaint:  Chief Complaint  Patient presents with  . Left Hip - Routine Post Op   Visit Diagnoses:  1. Pain in left hip     Plan: [place] is two-week status post IM nail of a left intertrochanteric fracture.  She is doing well overall.  She is scheduled to go home facility tomorrow.  She is ambulating with a walker independently.  She is nearly completely independent with ADLs.  Surgical incisions are healed.  Staples were removed today.  Steri-Strips were applied.  She will continue with weightbearing as tolerated in a walker.  She will continue with PT for strengthening.  We will recheck her in 4 weeks with two-view x-rays of the left hip.  Follow-Up Instructions: Return in about 4 weeks (around 11/24/2018).   Orders:  Orders Placed This Encounter  Procedures  . XR FEMUR MIN 2 VIEWS LEFT   Meds ordered this encounter  Medications  . ibuprofen (ADVIL) 800 MG tablet    Sig: Take 1 tablet (800 mg total) by mouth 2 (two) times daily as needed.    Dispense:  30 tablet    Refill:  2    Imaging: Xr Femur Min 2 Views Left  Result Date: 10/27/2018 Stable fixation of the intertrochanteric fracture.   PMFS History: Patient Active Problem List   Diagnosis Date Noted  . Fall   . Closed nondisplaced intertrochanteric fracture of left femur ([place]) 10/12/2018  . Femur fracture, left ([place]) 10/12/2018  . Hypotension 06/26/2018  . Autonomic neuropathy 03/24/2018  . Acute delirium 03/03/2018  . H/O gastric bypass 03/02/2018  . Hypertension associated with stage 3 chronic kidney disease due to type 2 diabetes mellitus ([place]) 02/20/2018  . SI (sacroiliac) joint dysfunction 12/02/2017  . MI, acute, non ST segment elevation ([place])   . Effort angina ([place]) 06/29/2017  . Unstable angina ([place])  06/24/2017  . Syncope 04/09/2017  . Insomnia 03/18/2017  . Ischemic cardiomyopathy   . Arthritis   . Anxiety   . Tendinitis of upper biceps tendon of right shoulder 03/16/2017  . Degenerative tear of glenoid labrum of right shoulder 03/15/2017  . Acute colitis 01/27/2017  . Hx of colonic polyps   . H/O medication noncompliance 12/14/2015  . Emesis   . Atherosclerosis of native coronary artery of native heart with stable angina pectoris ([place])   . Hypertensive heart disease   . CKD (chronic kidney disease), stage III ([place]) 06/05/2015  . Status post bariatric surgery 06/04/2015  . Colitis 06/03/2015  . Carotid stenosis 04/30/2015  . Type 2 diabetes mellitus with stage 3 chronic kidney disease, without long-term current use of insulin ([place])   . Stable angina pectoris ([place]) 04/17/2015  . Iron deficiency anemia 03/22/2015  . Vitamin B12 deficiency 02/18/2015  . Misuse of medications for pain 02/18/2015  . Major depressive disorder, recurrent, severe with psychotic features ([place]) 02/15/2015  . Helicobacter pylori infection 11/23/2014  . Hemiparesis, left ([place]) 11/23/2014  . Benign neoplasm of colon 11/20/2014  . Malignant melanoma ([place]) 08/25/2014  . Chronic systolic CHF (congestive heart failure) ([place])   . Incomplete bladder emptying 07/12/2014  . Hypothyroidism 12/30/2013  . Aberrant subclavian artery 11/17/2013  . Multiple sclerosis ([place]) 11/02/2013  .  History of CVA with residual deficit 06/20/2013  . Headache, migraine 05/29/2013  . Hyperlipidemia   . GERD (gastroesophageal reflux disease)   . Neuropathy ([place]) 01/02/2011  . Stroke ([place]) 06/21/2008  . Depression with anxiety 05/01/2008  . Essential hypertension 05/01/2008   Past Medical History:  Diagnosis Date  . Acute pyelonephritis   . Anemia    iron deficiency anemia  . Aortic arch aneurysm ([place])   . Bipolar disorder ([place])   . BRCA negative 2014  . CAD (coronary artery disease)    a. 08/2003 Cath: LAD 30-40-med Rx;  b. 11/2014 PCI: LAD 70m(3.25x23 Xience Alpine DES); c. 06/2015 PCI: D1 (2.25x12 Resolute Integrity DES); d. 06/2017 PCI: Patent mLAD stent, D2 95 (PTCA); e. 09/2017 PCI: D2 99ost (CBA); d. 12/2017 Cath: LM nl, LAD 359m80d (small), D1 40ost, D2 95ost, LCX 40p, RCA 40ost/p->Med rx for D2 given restenosis.  . CKD (chronic kidney disease), stage III (HCMedicine Park  . Colon polyp   . CVA (cerebral vascular accident) (HCReader   Left side weakness.   . Diabetes ([place]  . Family history of breast cancer    BRCA neg 2014  . Gastric ulcer 04/27/2011  . History of echocardiogram    a. 03/2017 Echo: EF 60-65%, no rwma; b. 02/2018 Echo: EF 60-65%, no rwma. Nl RV fxn. No cardiac source of emboli (admitted w/ stroke).  . [place] KitchenTN (hypertension)   . Hyperlipemia   . Hypothyroid   . Malignant melanoma of skin of scalp (HCStarke  . MI, acute, non ST segment elevation (HCSchleicher  . Neuromuscular disorder (HCHancock  . Orthostatic hypotension   . S/P drug eluting coronary stent placement 06/04/2015  . Sepsis (HCEffort2/14/2019  . Stroke ([facility]   a. 02/2018 MRI: 30m94mate acute/early subacute L medial frontal lobe inarct; b. 02/2018 MRA No large vessel occlusion or aneurysm. Mod to sev L P2 stenosis. thready L vertebral artery, diffusely dzs'd; c. 02/2018 Carotid U/S: <50% bilat ICA dzs.    Family History  Problem Relation Age of Onset  . Hypertension Mother   . Anxiety disorder Mother   . Depression Mother   . Bipolar disorder Mother   . Heart disease Mother        No details  . Hyperlipidemia Mother   . Kidney disease Father   . Heart disease Father 40 41 Hypertension Father   . Diabetes Father   . Stroke Father   . Colon cancer Father        dx in his 40'37's Anxiety disorder Father   . Depression Father   . Skin cancer Father   . Kidney disease Sister   . Thyroid nodules Sister   . Hypertension Sister   . Hypertension Sister   . Diabetes Sister   . Hyperlipidemia Sister   . Depression Sister   . Breast cancer Maternal  Aunt 50 14 Breast cancer Maternal Aunt 50 46 Ovarian cancer Cousin   . Colon cancer Cousin   . Breast cancer Other   . Kidney cancer Neg Hx   . Bladder Cancer Neg Hx     Past Surgical History:  Procedure Laterality Date  . APPENDECTOMY    . CARDIAC CATHETERIZATION N/A 11/09/2014   Procedure: Coronary Angiography;  Surgeon: TimMinna MerrittsD;  [facility];  Service: Cardiovascular;  Laterality: N/A;  . CARDIAC CATHETERIZATION N/A 11/12/2014   Procedure: Coronary Stent Intervention;  Surgeon: AleSheppard Coil  Paraschos, MD;  Location: [place] [facility] LAB;  Service: Cardiovascular;  Laterality: N/A;  . CARDIAC CATHETERIZATION N/A 04/18/2015   Procedure: Left Heart Cath and Coronary Angiography;  Surgeon: Minna Merritts, MD;  Location: [place] [facility] LAB;  Service: Cardiovascular;  Laterality: N/A;  . CARDIAC CATHETERIZATION Left 06/04/2015   Procedure: Left Heart Cath and Coronary Angiography;  Surgeon: Wellington Hampshire, MD;  Location: [place] [facility] LAB;  Service: Cardiovascular;  Laterality: Left;  . CARDIAC CATHETERIZATION N/A 06/04/2015   Procedure: Coronary Stent Intervention;  Surgeon: Wellington Hampshire, MD;  Location: [place] [facility] LAB;  Service: Cardiovascular;  Laterality: N/A;  . CESAREAN SECTION  2001  . CHOLECYSTECTOMY N/A 11/18/2016   Procedure: LAPAROSCOPIC CHOLECYSTECTOMY WITH INTRAOPERATIVE CHOLANGIOGRAM;  Surgeon: Christene Lye, MD;  Location: [facility];  Service: General;  Laterality: N/A;  . COLONOSCOPY WITH PROPOFOL N/A 04/27/2016   Procedure: COLONOSCOPY WITH PROPOFOL;  Surgeon: [name], MD;  Location: [place];  Service: Endoscopy;  Laterality: N/A;  . COLONOSCOPY WITH PROPOFOL N/A 01/12/2018   Procedure: COLONOSCOPY WITH PROPOFOL;  Surgeon: Toledo, Benay Pike, MD;  Location: [facility] ENDOSCOPY;  Service: Endoscopy;  Laterality: N/A;  . CORONARY ANGIOPLASTY    . CORONARY BALLOON ANGIOPLASTY N/A 06/29/2017   Procedure: CORONARY BALLOON  ANGIOPLASTY;  Surgeon: Wellington Hampshire, MD;  Location: [place] [facility] LAB;  Service: Cardiovascular;  Laterality: N/A;  . CORONARY BALLOON ANGIOPLASTY N/A 09/20/2017   Procedure: CORONARY BALLOON ANGIOPLASTY;  Surgeon: Wellington Hampshire, MD;  Location: [place] [facility] LAB;  Service: Cardiovascular;  Laterality: N/A;  . DILATION AND CURETTAGE OF UTERUS    . ESOPHAGOGASTRODUODENOSCOPY (EGD) WITH PROPOFOL N/A 09/14/2014   Procedure: ESOPHAGOGASTRODUODENOSCOPY (EGD) WITH PROPOFOL;  Surgeon: Josefine Class, MD;  [facility];  Service: Endoscopy;  Laterality: N/A;  . ESOPHAGOGASTRODUODENOSCOPY (EGD) WITH PROPOFOL N/A 04/27/2016   Procedure: ESOPHAGOGASTRODUODENOSCOPY (EGD) WITH PROPOFOL;  Surgeon: [name], MD;  Location: [place];  Service: Endoscopy;  Laterality: N/A;  Diabetic - oral meds  . ESOPHAGOGASTRODUODENOSCOPY (EGD) WITH PROPOFOL N/A 01/12/2018   Procedure: ESOPHAGOGASTRODUODENOSCOPY (EGD) WITH PROPOFOL;  Surgeon: Toledo, Benay Pike, MD;  Location: [facility] ENDOSCOPY;  Service: Endoscopy;  Laterality: N/A;  . GASTRIC BYPASS  09/2009   [place] (IM) NAIL INTERTROCHANTERIC Left 10/13/2018   Procedure: INTRAMEDULLARY (IM) NAIL INTERTROCHANTRIC;  Surgeon: Leandrew Koyanagi, MD;  Location: [place];  Service: Orthopedics;  Laterality: Left;  . Left Carotid to sublcavian artery bypass w/ subclavian artery ligation     a. Performed @ [facility].  . LEFT HEART CATH AND CORONARY ANGIOGRAPHY Left 06/29/2017   Procedure: LEFT HEART CATH AND CORONARY ANGIOGRAPHY;  Surgeon: Wellington Hampshire, MD;  Location: [place] [facility] LAB;  Service: Cardiovascular;  Laterality: Left;  . LEFT HEART CATH AND CORONARY ANGIOGRAPHY N/A 09/20/2017   Procedure: LEFT HEART CATH AND CORONARY ANGIOGRAPHY;  Surgeon: Wellington Hampshire, MD;  Location: [place] [facility] LAB;  Service: Cardiovascular;  Laterality: N/A;  . LEFT HEART CATH AND CORONARY ANGIOGRAPHY N/A 12/20/2017    Procedure: LEFT HEART CATH AND CORONARY ANGIOGRAPHY;  Surgeon: Wellington Hampshire, MD;  Location: [place] [facility];  Service: Cardiovascular;  Laterality: N/A;  . MELANOMA EXCISION  2016   Dr. Evorn Gong  . [place]  2002  . RIGHT OOPHORECTOMY    . SHOULDER ARTHROSCOPY WITH OPEN ROTATOR CUFF REPAIR Right 01/07/2016   Procedure: SHOULDER ARTHROSCOPY WITH DEBRIDMENT, SUBACHROMIAL DECOMPRESSION;  Surgeon: Jenny Reichmann  Robby Sermon, MD;  Location: [facility];  Service: Orthopedics;  Laterality: Right;  . SHOULDER ARTHROSCOPY WITH OPEN ROTATOR CUFF REPAIR Right 03/16/2017   Procedure: SHOULDER ARTHROSCOPY WITH OPEN ROTATOR CUFF REPAIR POSSIBLE BICEPS TENODESIS;  Surgeon: Corky Mull, MD;  Location: [facility];  Service: Orthopedics;  Laterality: Right;  . TRIGGER FINGER RELEASE Right     [place]   Social History   Occupational History  . 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 Disabled through 08/02/2013 per forms 02/03/2013  Tobacco Use  . Smoking status: Former Smoker    Types: Cigarettes    Quit date: 08/31/1994    Years since quitting: 24.1  . Smokeless tobacco: Never Used  . Tobacco comment: quit 28 years ago  Substance and Sexual Activity  . Alcohol use: No    Alcohol/week: 0.0 standard drinks  . Drug use: No  . Sexual activity: Not Currently    Birth control/protection: None

## 2018-10-28 ENCOUNTER — Telehealth: Payer: Self-pay | Admitting: Family Medicine

## 2018-10-28 DIAGNOSIS — R2681 Unsteadiness on feet: Secondary | ICD-10-CM | POA: Diagnosis not present

## 2018-10-28 DIAGNOSIS — M6281 Muscle weakness (generalized): Secondary | ICD-10-CM | POA: Diagnosis not present

## 2018-10-28 DIAGNOSIS — S72142D Displaced intertrochanteric fracture of left femur, subsequent encounter for closed fracture with routine healing: Secondary | ICD-10-CM | POA: Diagnosis not present

## 2018-10-28 DIAGNOSIS — R262 Difficulty in walking, not elsewhere classified: Secondary | ICD-10-CM | POA: Diagnosis not present

## 2018-10-28 NOTE — Telephone Encounter (Signed)
Patient advised as below. Patient informed me that she fell and broke her hip and is being discharged from rehabilitation today. Patient was told to follow up with her PCP after being discharged. I scheduled an appointment for patient to come in 11/04/2018 at 10:40am. Is this time slot OK? Patient is staying with her mom in [place] right now and has to rely on someone bringing her. She also wants to wait to have H. Pylori recheck at the f/u appointment.

## 2018-10-28 NOTE — Telephone Encounter (Signed)
Patient was treated for h. Pylori last month and is due to come in to have breath test to make sure it is cured. Please advise and leave order at lab for her.

## 2018-11-01 DIAGNOSIS — I130 Hypertensive heart and chronic kidney disease with heart failure and stage 1 through stage 4 chronic kidney disease, or unspecified chronic kidney disease: Secondary | ICD-10-CM | POA: Diagnosis not present

## 2018-11-01 DIAGNOSIS — E1122 Type 2 diabetes mellitus with diabetic chronic kidney disease: Secondary | ICD-10-CM | POA: Diagnosis not present

## 2018-11-01 DIAGNOSIS — E039 Hypothyroidism, unspecified: Secondary | ICD-10-CM | POA: Diagnosis not present

## 2018-11-01 DIAGNOSIS — Z9181 History of falling: Secondary | ICD-10-CM | POA: Diagnosis not present

## 2018-11-01 DIAGNOSIS — W19XXXD Unspecified fall, subsequent encounter: Secondary | ICD-10-CM | POA: Diagnosis not present

## 2018-11-01 DIAGNOSIS — G35 Multiple sclerosis: Secondary | ICD-10-CM | POA: Diagnosis not present

## 2018-11-01 DIAGNOSIS — Z85820 Personal history of malignant melanoma of skin: Secondary | ICD-10-CM | POA: Diagnosis not present

## 2018-11-01 DIAGNOSIS — I2510 Atherosclerotic heart disease of native coronary artery without angina pectoris: Secondary | ICD-10-CM | POA: Diagnosis not present

## 2018-11-01 DIAGNOSIS — Z87891 Personal history of nicotine dependence: Secondary | ICD-10-CM | POA: Diagnosis not present

## 2018-11-01 DIAGNOSIS — S72142D Displaced intertrochanteric fracture of left femur, subsequent encounter for closed fracture with routine healing: Secondary | ICD-10-CM | POA: Diagnosis not present

## 2018-11-01 DIAGNOSIS — E1140 Type 2 diabetes mellitus with diabetic neuropathy, unspecified: Secondary | ICD-10-CM | POA: Diagnosis not present

## 2018-11-01 DIAGNOSIS — Z7902 Long term (current) use of antithrombotics/antiplatelets: Secondary | ICD-10-CM | POA: Diagnosis not present

## 2018-11-01 DIAGNOSIS — Z7984 Long term (current) use of oral hypoglycemic drugs: Secondary | ICD-10-CM | POA: Diagnosis not present

## 2018-11-01 DIAGNOSIS — Z9049 Acquired absence of other specified parts of digestive tract: Secondary | ICD-10-CM | POA: Diagnosis not present

## 2018-11-01 DIAGNOSIS — E785 Hyperlipidemia, unspecified: Secondary | ICD-10-CM | POA: Diagnosis not present

## 2018-11-01 DIAGNOSIS — I69354 Hemiplegia and hemiparesis following cerebral infarction affecting left non-dominant side: Secondary | ICD-10-CM | POA: Diagnosis not present

## 2018-11-01 DIAGNOSIS — D631 Anemia in chronic kidney disease: Secondary | ICD-10-CM | POA: Diagnosis not present

## 2018-11-01 DIAGNOSIS — F319 Bipolar disorder, unspecified: Secondary | ICD-10-CM | POA: Diagnosis not present

## 2018-11-01 DIAGNOSIS — Z9089 Acquired absence of other organs: Secondary | ICD-10-CM | POA: Diagnosis not present

## 2018-11-01 DIAGNOSIS — N182 Chronic kidney disease, stage 2 (mild): Secondary | ICD-10-CM | POA: Diagnosis not present

## 2018-11-01 DIAGNOSIS — F419 Anxiety disorder, unspecified: Secondary | ICD-10-CM | POA: Diagnosis not present

## 2018-11-01 DIAGNOSIS — I5022 Chronic systolic (congestive) heart failure: Secondary | ICD-10-CM | POA: Diagnosis not present

## 2018-11-01 DIAGNOSIS — K5900 Constipation, unspecified: Secondary | ICD-10-CM | POA: Diagnosis not present

## 2018-11-01 DIAGNOSIS — Z6827 Body mass index (BMI) 27.0-27.9, adult: Secondary | ICD-10-CM | POA: Diagnosis not present

## 2018-11-01 DIAGNOSIS — Z9884 Bariatric surgery status: Secondary | ICD-10-CM | POA: Diagnosis not present

## 2018-11-01 IMAGING — XA DG CHOLANGIOGRAM OPERATIVE
1 series · 4 of 4 positions shown · non-contrast
Comparison: Abdominal ultrasound - 10/20/2016

CLINICAL DATA: Intraoperative cholangiogram during laparoscopic
cholecystectomy.

EXAM:
INTRAOPERATIVE CHOLANGIOGRAM
FLUOROSCOPY TIME:  36 seconds (9.6 mGy)

[Series 2: ortho standard · 4 of 384 frames shown]
[frame 58/384]
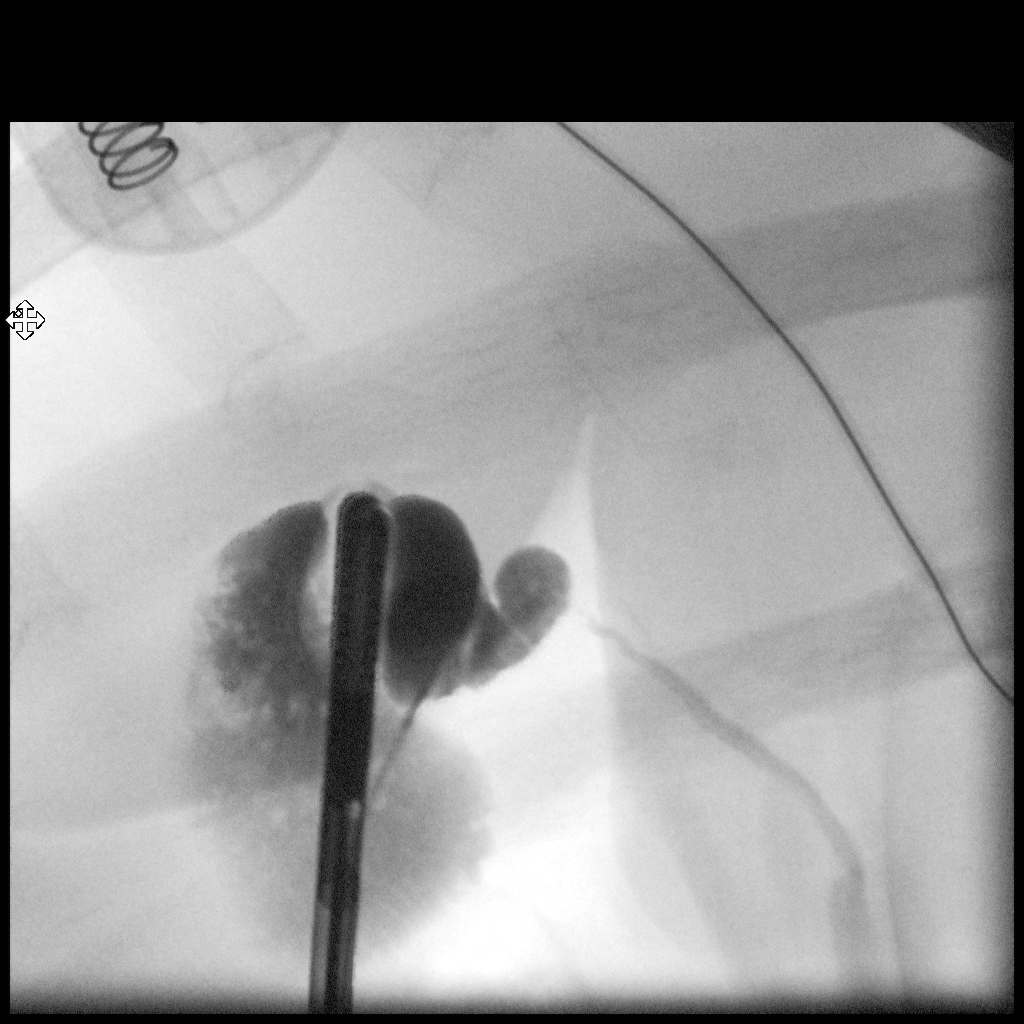
[frame 193/384]
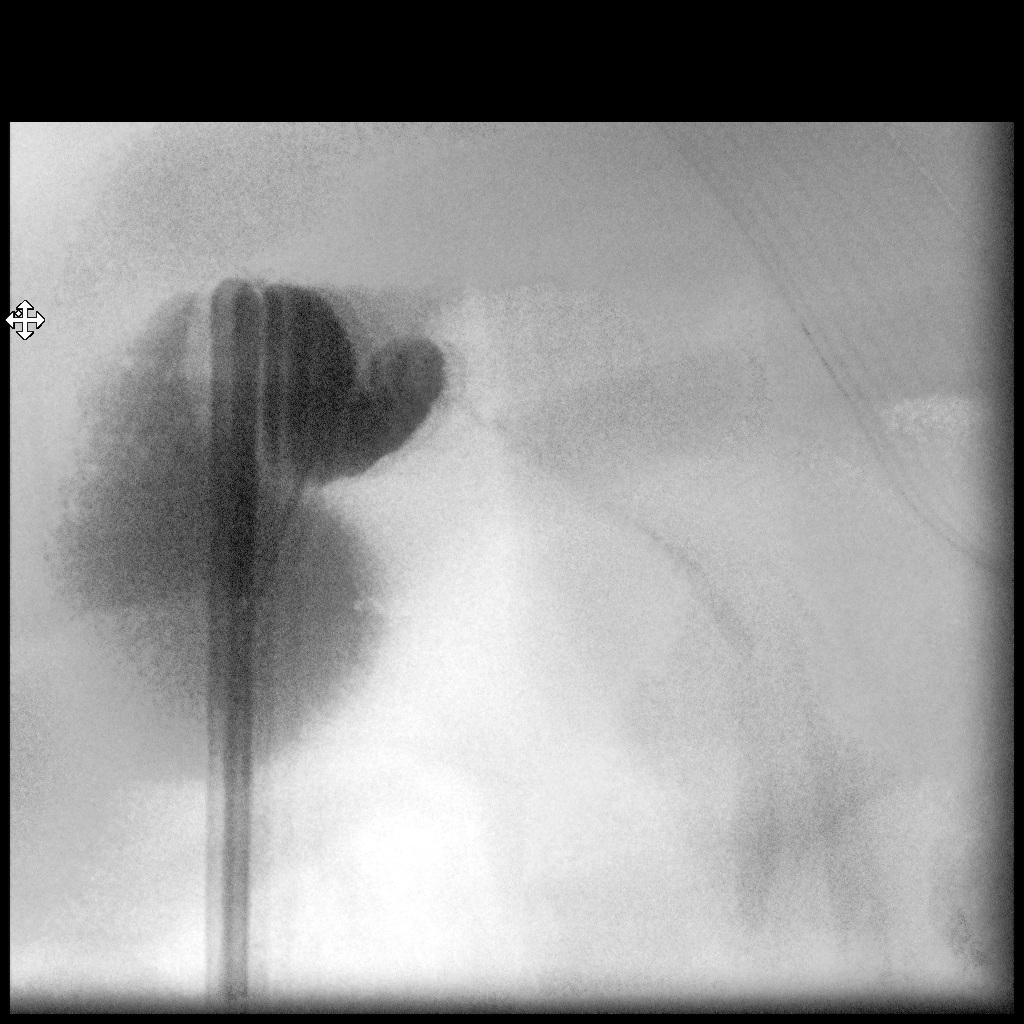
[frame 327/384]
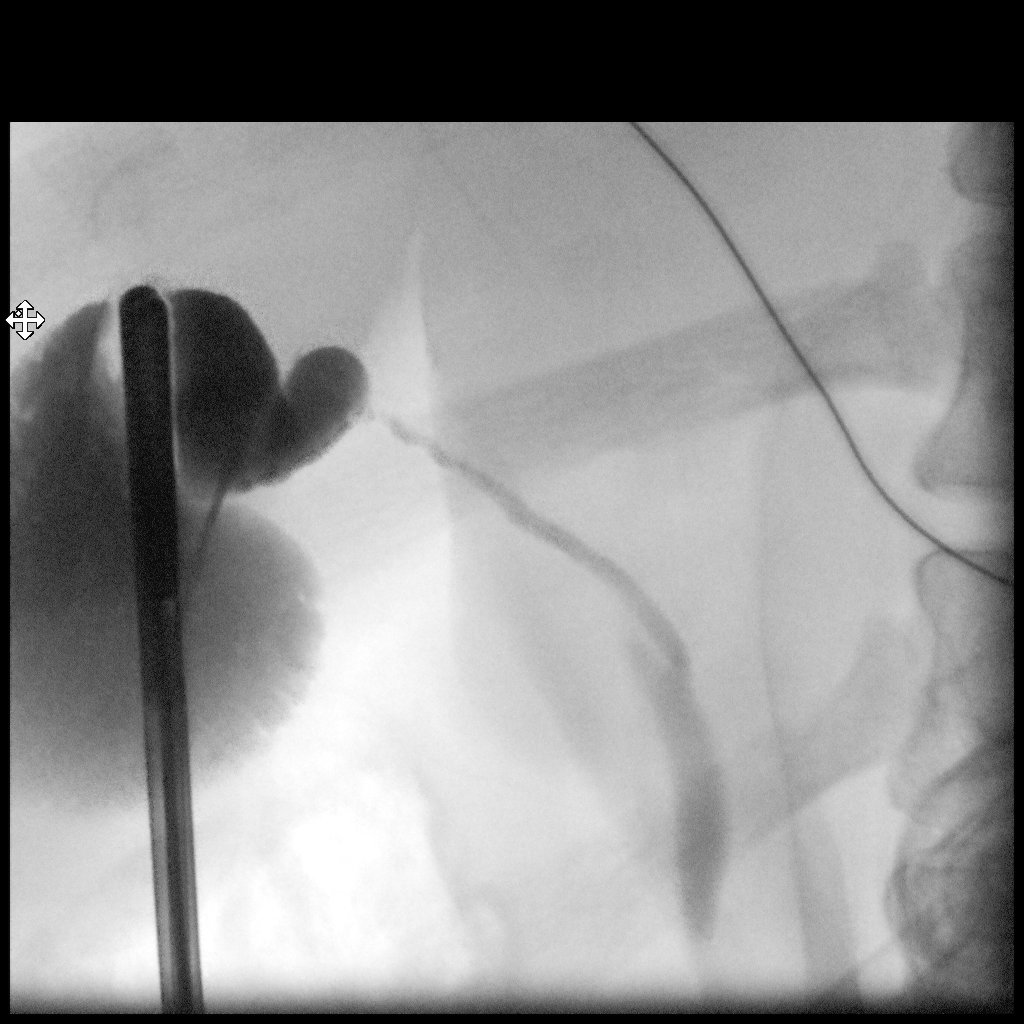
[frame 339/384]
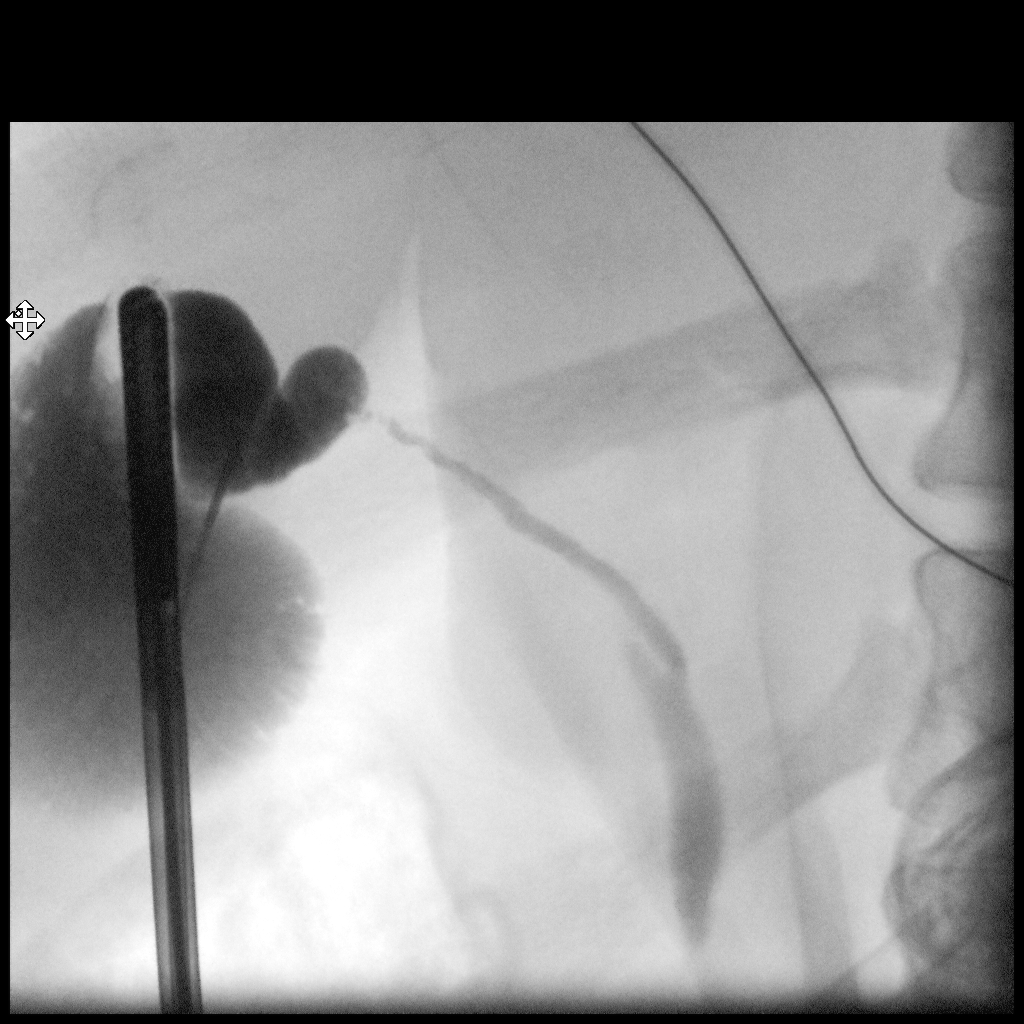

[4 of 4 positions shown; findings below may reference images not displayed]

FINDINGS: Intraoperative cholangiographic images of the right upper abdominal
quadrant during laparoscopic cholecystectomy are provided for
review.

Surgical clips overlie the expected location of the gallbladder
fossa.

Contrast injection demonstrates selective cannulation of the central
aspect of the cystic duct.

There is passage of contrast through the central aspect of the
cystic duct with faint opacification of a non dilated common bile
duct.

There is no definitive opacification of the duodenum.

There are no discrete filling defects within the opacified portions
of the biliary system to suggest the presence of
choledocholithiasis.
IMPRESSION: While there are no discrete filling defects within the opacified
portions of the cystic or common bile ducts to suggest
choledocholithiasis, there is no definitive opacification of the
duodenum. Correlation with the operative report is recommended.

## 2018-11-02 ENCOUNTER — Telehealth: Payer: Self-pay

## 2018-11-02 NOTE — Telephone Encounter (Signed)
Home health needs order for PT twice/week x 6 weeks  [facility] [phone] Can leave a message  Aware he is out of the office until Friday and it is ok to wait until that time

## 2018-11-03 DIAGNOSIS — E1122 Type 2 diabetes mellitus with diabetic chronic kidney disease: Secondary | ICD-10-CM | POA: Diagnosis not present

## 2018-11-03 DIAGNOSIS — I5022 Chronic systolic (congestive) heart failure: Secondary | ICD-10-CM | POA: Diagnosis not present

## 2018-11-03 DIAGNOSIS — G35 Multiple sclerosis: Secondary | ICD-10-CM | POA: Diagnosis not present

## 2018-11-03 DIAGNOSIS — I130 Hypertensive heart and chronic kidney disease with heart failure and stage 1 through stage 4 chronic kidney disease, or unspecified chronic kidney disease: Secondary | ICD-10-CM | POA: Diagnosis not present

## 2018-11-03 DIAGNOSIS — I2510 Atherosclerotic heart disease of native coronary artery without angina pectoris: Secondary | ICD-10-CM | POA: Diagnosis not present

## 2018-11-03 DIAGNOSIS — S72142D Displaced intertrochanteric fracture of left femur, subsequent encounter for closed fracture with routine healing: Secondary | ICD-10-CM | POA: Diagnosis not present

## 2018-11-03 NOTE — Telephone Encounter (Signed)
That's fine

## 2018-11-04 ENCOUNTER — Encounter: Payer: Self-pay | Admitting: Family Medicine

## 2018-11-04 ENCOUNTER — Ambulatory Visit (INDEPENDENT_AMBULATORY_CARE_PROVIDER_SITE_OTHER): Payer: Medicare Other | Admitting: Family Medicine

## 2018-11-04 ENCOUNTER — Other Ambulatory Visit: Payer: Self-pay

## 2018-11-04 VITALS — BP 130/78 | HR 76 | Temp 97.9°F | Resp 16 | Wt 157.0 lb

## 2018-11-04 DIAGNOSIS — R1013 Epigastric pain: Secondary | ICD-10-CM

## 2018-11-04 DIAGNOSIS — I5022 Chronic systolic (congestive) heart failure: Secondary | ICD-10-CM

## 2018-11-04 DIAGNOSIS — I255 Ischemic cardiomyopathy: Secondary | ICD-10-CM | POA: Diagnosis not present

## 2018-11-04 DIAGNOSIS — D509 Iron deficiency anemia, unspecified: Secondary | ICD-10-CM

## 2018-11-04 DIAGNOSIS — S72002A Fracture of unspecified part of neck of left femur, initial encounter for closed fracture: Secondary | ICD-10-CM | POA: Diagnosis not present

## 2018-11-04 DIAGNOSIS — Z23 Encounter for immunization: Secondary | ICD-10-CM

## 2018-11-04 DIAGNOSIS — R790 Abnormal level of blood mineral: Secondary | ICD-10-CM

## 2018-11-04 DIAGNOSIS — K2970 Gastritis, unspecified, without bleeding: Secondary | ICD-10-CM

## 2018-11-04 DIAGNOSIS — N3001 Acute cystitis with hematuria: Secondary | ICD-10-CM | POA: Diagnosis not present

## 2018-11-04 DIAGNOSIS — B9681 Helicobacter pylori [H. pylori] as the cause of diseases classified elsewhere: Secondary | ICD-10-CM | POA: Diagnosis not present

## 2018-11-04 LAB — POCT URINALYSIS DIPSTICK
Bilirubin, UA: NEGATIVE
Glucose, UA: NEGATIVE
Ketones, UA: NEGATIVE
Nitrite, UA: POSITIVE
Protein, UA: POSITIVE — AB
Spec Grav, UA: <=1.005 — AB (ref 1.010–1.025)
Urobilinogen, UA: 0.2 E.U./dL
pH, UA: 7 (ref 5.0–8.0)

## 2018-11-04 MED ORDER — CEPHALEXIN 500 MG PO CAPS: 500.0000 mg | ORAL_CAPSULE | Freq: Four times a day (QID) | ORAL | 0 refills | Status: AC

## 2018-11-04 NOTE — Patient Instructions (Signed)
.   Please review the attached list of medications and notify my office if there are any errors.   . Please bring all of your medications to every appointment so we can make sure that our medication list is the same as yours.   . It is especially important to get the annual flu vaccine this year. If you haven't had it already, please go to your pharmacy or call the office as soon as possible to schedule you flu shot.  

## 2018-11-04 NOTE — Telephone Encounter (Signed)
Advised as below.  

## 2018-11-04 NOTE — Progress Notes (Signed)
     Patient: [name] Female    DOB: 01/27/1971   48 y.o.   MRN: [id number] Visit Date: 11/04/2018  Today's Provider: Donald Fisher, MD   Chief Complaint  Patient presents with  . Hospitalization Follow-up  . Hip Injury   Subjective:   HPI   Follow up Hospitalization  Patient was admitted to [facility] on 10/12/2018 and discharged on 10/18/2018. She was sent to a skilled nursing facility after her stay at the hospital and was discharged on 10/28/2018.  She was treated for left femur fracture from a fall. Treatment for this included home physical therapy.  She reports good compliance with treatment. She reports this condition is Improved.   She also reports urinary frequency and urgency for 3-4 weeks. No fevers, chills, or sweats. She had been on triple therapy for h. Pylori when she developed UTI symptoms.   She was also seen here about 5 weeks ago with nausea and epigastric pain. She had high h. Pylor IgM and was treated with amoxicillin, clarithromycin and pantoprazole.   She was also anemic with hgb=10.4, while hospitalized it dropped to 7.5 with ferritin of 19 and iron studies c/w iron deficiency.   Allergies  Allergen Reactions  . Lipitor [Atorvastatin] Other (See Comments)    Leg pains  . Tramadol Other (See Comments)    Mouth feels like it's on fire  . Lactose Intolerance (Gi) Nausea Only     Current Outpatient Medications:  .  ALPRAZolam (XANAX) 1 MG tablet, TAKE 1 TABLET BY MOUTH EVERY DAY AS NEEDED FOR ANXIETY, Disp: 10 tablet, Rfl: 0 .  aspirin EC 325 MG EC tablet, Take 1 tablet (325 mg total) by mouth daily. Please take for 6 weeks and then stop, Disp: 30 tablet, Rfl: 0 .  Blood Glucose Monitoring Suppl (ONE TOUCH ULTRA 2) w/Device KIT, Use to check blood sugar once a day. Dx. E11.9, Disp: 1 each, Rfl: 0 .  buPROPion (WELLBUTRIN XL) 150 MG 24 hr tablet, TAKE 1 TABLET BY MOUTH EVERY DAY (Patient taking differently: Take 150 mg by mouth  daily. ), Disp: 90 tablet, Rfl: 4 .  clopidogrel (PLAVIX) 75 MG tablet, Take 1 tablet (75 mg total) by mouth daily., Disp: 90 tablet, Rfl: 3 .  diphenoxylate-atropine (LOMOTIL) 2.5-0.025 MG tablet, Take one capsule 1-4 times daily as needed for diarrhea, Disp: , Rfl:  .  docusate sodium (COLACE) 100 MG capsule, Take 1 capsule (100 mg total) by mouth 2 (two) times daily., Disp: 10 capsule, Rfl: 0 .  ferrous sulfate 325 (65 FE) MG EC tablet, Take 1 tablet (325 mg total) by mouth 2 (two) times daily., Disp: 60 tablet, Rfl: 3 .  gabapentin (NEURONTIN) 300 MG capsule, Take 1 capsule (300 mg total) by mouth 2 (two) times daily., Disp: 180 capsule, Rfl: 4 .  ibuprofen (ADVIL) 800 MG tablet, Take 1 tablet (800 mg total) by mouth 2 (two) times daily as needed., Disp: 30 tablet, Rfl: 2 .  isosorbide mononitrate (IMDUR) 30 MG 24 hr tablet, Take 1 tablet (30 mg total) by mouth daily., Disp: 30 tablet, Rfl: 2 .  levothyroxine (SYNTHROID, LEVOTHROID) 25 MCG tablet, Take 1 tablet (25 mcg total) by mouth daily before breakfast., Disp: 30 tablet, Rfl: 12 .  losartan (COZAAR) 25 MG tablet, Take 25 mg by mouth daily., Disp: , Rfl:  .  metoprolol succinate (TOPROL XL) 25 MG 24 hr tablet, Take 0.5 tablets (12.5 mg total) by mouth daily., Disp: ,   Rfl:  .  nitroGLYCERIN (NITROSTAT) 0.4 MG SL tablet, Place 1 tablet (0.4 mg total) under the tongue every 5 (five) minutes as needed for chest pain., Disp: 25 tablet, Rfl: 2 .  oxyCODONE-acetaminophen (PERCOCET) 5-325 MG tablet, Take 1-2 tablets by mouth every 8 (eight) hours as needed for severe pain., Disp: 30 tablet, Rfl: 0 .  pantoprazole (PROTONIX) 40 MG tablet, Take 1 tablet (40 mg total) by mouth 2 (two) times daily., Disp: 60 tablet, Rfl: 5 .  polyethylene glycol (MIRALAX / GLYCOLAX) 17 g packet, Take 17 g by mouth daily as needed for mild constipation., Disp: 14 each, Rfl: 0 .  promethazine (PHENERGAN) 25 MG tablet, Take 25 mg by mouth every 6 (six) hours as needed for  nausea or vomiting., Disp: , Rfl:  .  rosuvastatin (CRESTOR) 40 MG tablet, Take 1 tablet (40 mg total) by mouth daily., Disp: 90 tablet, Rfl: 4 .  tiZANidine (ZANAFLEX) 4 MG tablet, Take 4 mg by mouth daily as needed for pain., Disp: , Rfl:  .  venlafaxine XR (EFFEXOR-XR) 75 MG 24 hr capsule, Take 1 capsule (75 mg total) by mouth daily with breakfast., Disp: 30 capsule, Rfl: 5 .  diclofenac sodium (VOLTAREN) 1 % GEL, Apply 2 g topically 3 (three) times daily as needed. (Patient not taking: Reported on 10/12/2018), Disp: 100 g, Rfl: 0 .  ondansetron (ZOFRAN) 4 MG tablet, Take 1 tablet (4 mg total) by mouth every 8 (eight) hours as needed. (Patient not taking: Reported on 10/12/2018), Disp: 20 tablet, Rfl: 0 .  senna (SENOKOT) 8.6 MG TABS tablet, Take 2 tablets (17.2 mg total) by mouth at bedtime. (Patient not taking: Reported on 11/04/2018), Disp: 120 tablet, Rfl: 0 .  senna-docusate (SENOKOT-S) 8.6-50 MG tablet, Take 2 tablets by mouth daily as needed for mild constipation. (Patient not taking: Reported on 11/04/2018), Disp: 30 tablet, Rfl: 1  Review of Systems  Constitutional: Positive for activity change. Negative for fatigue.  Cardiovascular: Negative for chest pain, palpitations and leg swelling.  Musculoskeletal: Positive for arthralgias and gait problem.       Walking with a walker   Neurological: Negative for dizziness, light-headedness and headaches.  Psychiatric/Behavioral: Negative for agitation, self-injury, sleep disturbance and suicidal ideas. The patient is not nervous/anxious.     Social History   Tobacco Use  . Smoking status: Former Smoker    Types: Cigarettes    Quit date: 08/31/1994    Years since quitting: 24.1  . Smokeless tobacco: Never Used  . Tobacco comment: quit 28 years ago  Substance Use Topics  . Alcohol use: No    Alcohol/week: 0.0 standard drinks      Objective:   BP 130/78   Pulse 76   Temp 97.9 F (36.6 C)   Resp 16   Wt 157 lb (71.2 kg)   SpO2 100%    BMI 28.72 kg/m  Vitals:   11/04/18 1026  BP: 130/78  Pulse: 76  Resp: 16  Temp: 97.9 F (36.6 C)  SpO2: 100%  Weight: 157 lb (71.2 kg)  Body mass index is 28.72 kg/m.   Physical Exam   General Appearance:    Well developed, well nourished female in no acute distress  Eyes:    PERRL, conjunctiva/corneas clear, EOM's intact       Lungs:     Clear to auscultation bilaterally, respirations unlabored  Heart:    Normal heart rate. Normal rhythm. No murmurs, rubs, or gallops.   MS:   All  extremities are intact.   Neurologic:   Awake, alert, oriented x 3. No apparent focal neurological           defect.         Results for orders placed or performed in visit on 11/04/18  POCT urinalysis dipstick  Result Value Ref Range   Color, UA yellow    Clarity, UA cloudy    Glucose, UA Negative Negative   Bilirubin, UA negative    Ketones, UA negative    Spec Grav, UA <=1.005 (A) 1.010 - 1.025   Blood, UA hemolyzed moderate    pH, UA 7.0 5.0 - 8.0   Protein, UA Positive (A) Negative   Urobilinogen, UA 0.2 0.2 or 1.0 E.U./dL   Nitrite, UA positive    Leukocytes, UA Large (3+) (A) Negative       Assessment & Plan    1. Helicobacter pylori gastritis Completed triple therapy - H. pylori breath test  2. Epigastric pain Likely secondary to h. Pylori gastritis. TOC today. Last endo was 01/2018 Dr. Alice Reichert - H. pylori breath test  3. Iron deficiency anemia, unspecified iron deficiency anemia type  - CBC - Ferritin  4. Chronic systolic CHF (congestive heart failure) (HCC) stable - Comprehensive metabolic panel  5. Acute cystitis with hematuria  - cephALEXin (KEFLEX) 500 MG capsule; Take 1 capsule (500 mg total) by mouth 4 (four) times daily for 7 days.  Dispense: 28 capsule; Refill: 0 - Urine Culture  6. Decreased ferritin Checking ferritin today.   7. Closed fracture of left hip, initial encounter ([place]) Ambulating well with walker. Follow up orthopedics as scheduled.    8. Flu vaccine need  - Flu Vaccine QUAD 6+ mos PF IM (Fluarix Quad PF)    [name], MD  [facility]

## 2018-11-05 LAB — COMPREHENSIVE METABOLIC PANEL
ALT: 7 IU/L (ref 0–32)
AST: 13 IU/L (ref 0–40)
Albumin/Globulin Ratio: 1.4 (ref 1.2–2.2)
Albumin: 3.8 g/dL (ref 3.8–4.8)
Alkaline Phosphatase: 189 IU/L — ABNORMAL HIGH (ref 39–117)
BUN/Creatinine Ratio: 9 (ref 9–23)
BUN: 17 mg/dL (ref 6–24)
Bilirubin Total: 0.3 mg/dL (ref 0.0–1.2)
CO2: 19 mmol/L — ABNORMAL LOW (ref 20–29)
Calcium: 9.2 mg/dL (ref 8.7–10.2)
Chloride: 104 mmol/L (ref 96–106)
Creatinine, Ser: 1.89 mg/dL — ABNORMAL HIGH (ref 0.57–1.00)
GFR calc Af Amer: 35 mL/min/{1.73_m2} — ABNORMAL LOW (ref >59)
GFR calc non Af Amer: 31 mL/min/{1.73_m2} — ABNORMAL LOW (ref >59)
Globulin, Total: 2.7 g/dL (ref 1.5–4.5)
Glucose: 66 mg/dL (ref 65–99)
Potassium: 4.2 mmol/L (ref 3.5–5.2)
Sodium: 138 mmol/L (ref 134–144)
Total Protein: 6.5 g/dL (ref 6.0–8.5)

## 2018-11-05 LAB — FERRITIN: Ferritin: 43 ng/mL (ref 15–150)

## 2018-11-05 LAB — CBC
Hematocrit: 28.7 % — ABNORMAL LOW (ref 34.0–46.6)
Hemoglobin: 8.8 g/dL — ABNORMAL LOW (ref 11.1–15.9)
MCH: 23.4 pg — ABNORMAL LOW (ref 26.6–33.0)
MCHC: 30.7 g/dL — ABNORMAL LOW (ref 31.5–35.7)
MCV: 76 fL — ABNORMAL LOW (ref 79–97)
Platelets: 208 10*3/uL (ref 150–450)
RBC: 3.76 x10E6/uL — ABNORMAL LOW (ref 3.77–5.28)
RDW: 17.4 % — ABNORMAL HIGH (ref 11.7–15.4)
WBC: 8.8 10*3/uL (ref 3.4–10.8)

## 2018-11-06 LAB — H. PYLORI BREATH TEST: H pylori Breath Test: NEGATIVE

## 2018-11-07 ENCOUNTER — Telehealth: Payer: Self-pay | Admitting: Family Medicine

## 2018-11-07 DIAGNOSIS — E1122 Type 2 diabetes mellitus with diabetic chronic kidney disease: Secondary | ICD-10-CM | POA: Diagnosis not present

## 2018-11-07 DIAGNOSIS — G35 Multiple sclerosis: Secondary | ICD-10-CM | POA: Diagnosis not present

## 2018-11-07 DIAGNOSIS — I2510 Atherosclerotic heart disease of native coronary artery without angina pectoris: Secondary | ICD-10-CM | POA: Diagnosis not present

## 2018-11-07 DIAGNOSIS — I130 Hypertensive heart and chronic kidney disease with heart failure and stage 1 through stage 4 chronic kidney disease, or unspecified chronic kidney disease: Secondary | ICD-10-CM | POA: Diagnosis not present

## 2018-11-07 DIAGNOSIS — I639 Cerebral infarction, unspecified: Secondary | ICD-10-CM | POA: Diagnosis not present

## 2018-11-07 DIAGNOSIS — S72142D Displaced intertrochanteric fracture of left femur, subsequent encounter for closed fracture with routine healing: Secondary | ICD-10-CM | POA: Diagnosis not present

## 2018-11-07 DIAGNOSIS — I5022 Chronic systolic (congestive) heart failure: Secondary | ICD-10-CM | POA: Diagnosis not present

## 2018-11-07 LAB — URINE CULTURE

## 2018-11-07 MED ORDER — PROMETHAZINE HCL 25 MG PO TABS: 25.0000 mg | ORAL_TABLET | Freq: Four times a day (QID) | ORAL | 5 refills | Status: DC | PRN

## 2018-11-07 NOTE — Telephone Encounter (Signed)
Pt needs a refill on   Phenergan 25 mg  [facility] [address] Lady Gary  CB#  [phone]  [place]

## 2018-11-07 NOTE — Telephone Encounter (Signed)
Patient seen in office for gastritis ? hpylori on 11/04/18. Hpylori test was negative, please advise if okay to fill phenergan. KW

## 2018-11-08 ENCOUNTER — Telehealth: Payer: Self-pay

## 2018-11-08 NOTE — Telephone Encounter (Signed)
-----   Message from [name], MD sent at 11/06/2018  8:58 AM EDT ----- H. Pylori test is negative, no sign of h. Pylori infection at this time.  Kidney functions stable.  Blood cell count is low, but slowly improving. Iron level is better Need to continue taking iron supplement 325 mg, can just take one tablet daily for now,  Follow up for anemia in one month

## 2018-11-08 NOTE — Telephone Encounter (Signed)
[id number] 

## 2018-11-09 ENCOUNTER — Encounter: Payer: Self-pay | Admitting: Orthopaedic Surgery

## 2018-11-11 DIAGNOSIS — E1122 Type 2 diabetes mellitus with diabetic chronic kidney disease: Secondary | ICD-10-CM | POA: Diagnosis not present

## 2018-11-11 DIAGNOSIS — I130 Hypertensive heart and chronic kidney disease with heart failure and stage 1 through stage 4 chronic kidney disease, or unspecified chronic kidney disease: Secondary | ICD-10-CM | POA: Diagnosis not present

## 2018-11-11 DIAGNOSIS — G35 Multiple sclerosis: Secondary | ICD-10-CM | POA: Diagnosis not present

## 2018-11-11 DIAGNOSIS — I5022 Chronic systolic (congestive) heart failure: Secondary | ICD-10-CM | POA: Diagnosis not present

## 2018-11-11 DIAGNOSIS — I2510 Atherosclerotic heart disease of native coronary artery without angina pectoris: Secondary | ICD-10-CM | POA: Diagnosis not present

## 2018-11-11 DIAGNOSIS — S72142D Displaced intertrochanteric fracture of left femur, subsequent encounter for closed fracture with routine healing: Secondary | ICD-10-CM | POA: Diagnosis not present

## 2018-11-14 ENCOUNTER — Other Ambulatory Visit: Payer: Self-pay

## 2018-11-14 ENCOUNTER — Encounter (INDEPENDENT_AMBULATORY_CARE_PROVIDER_SITE_OTHER): Payer: Medicare Other | Admitting: Family Medicine

## 2018-11-14 DIAGNOSIS — I130 Hypertensive heart and chronic kidney disease with heart failure and stage 1 through stage 4 chronic kidney disease, or unspecified chronic kidney disease: Secondary | ICD-10-CM

## 2018-11-14 DIAGNOSIS — G35 Multiple sclerosis: Secondary | ICD-10-CM | POA: Diagnosis not present

## 2018-11-14 DIAGNOSIS — I5022 Chronic systolic (congestive) heart failure: Secondary | ICD-10-CM

## 2018-11-14 DIAGNOSIS — D631 Anemia in chronic kidney disease: Secondary | ICD-10-CM | POA: Diagnosis not present

## 2018-11-14 DIAGNOSIS — I2510 Atherosclerotic heart disease of native coronary artery without angina pectoris: Secondary | ICD-10-CM

## 2018-11-14 DIAGNOSIS — E1140 Type 2 diabetes mellitus with diabetic neuropathy, unspecified: Secondary | ICD-10-CM

## 2018-11-14 DIAGNOSIS — E1122 Type 2 diabetes mellitus with diabetic chronic kidney disease: Secondary | ICD-10-CM | POA: Diagnosis not present

## 2018-11-14 DIAGNOSIS — N182 Chronic kidney disease, stage 2 (mild): Secondary | ICD-10-CM | POA: Diagnosis not present

## 2018-11-14 DIAGNOSIS — K5900 Constipation, unspecified: Secondary | ICD-10-CM | POA: Diagnosis not present

## 2018-11-14 DIAGNOSIS — S72142D Displaced intertrochanteric fracture of left femur, subsequent encounter for closed fracture with routine healing: Secondary | ICD-10-CM | POA: Diagnosis not present

## 2018-11-16 DIAGNOSIS — S72142D Displaced intertrochanteric fracture of left femur, subsequent encounter for closed fracture with routine healing: Secondary | ICD-10-CM | POA: Diagnosis not present

## 2018-11-16 DIAGNOSIS — I130 Hypertensive heart and chronic kidney disease with heart failure and stage 1 through stage 4 chronic kidney disease, or unspecified chronic kidney disease: Secondary | ICD-10-CM | POA: Diagnosis not present

## 2018-11-16 DIAGNOSIS — E1122 Type 2 diabetes mellitus with diabetic chronic kidney disease: Secondary | ICD-10-CM | POA: Diagnosis not present

## 2018-11-16 DIAGNOSIS — I2510 Atherosclerotic heart disease of native coronary artery without angina pectoris: Secondary | ICD-10-CM | POA: Diagnosis not present

## 2018-11-16 DIAGNOSIS — G35 Multiple sclerosis: Secondary | ICD-10-CM | POA: Diagnosis not present

## 2018-11-16 DIAGNOSIS — I5022 Chronic systolic (congestive) heart failure: Secondary | ICD-10-CM | POA: Diagnosis not present

## 2018-11-22 ENCOUNTER — Other Ambulatory Visit: Payer: Self-pay | Admitting: Family Medicine

## 2018-11-22 DIAGNOSIS — I2510 Atherosclerotic heart disease of native coronary artery without angina pectoris: Secondary | ICD-10-CM | POA: Diagnosis not present

## 2018-11-22 DIAGNOSIS — E1122 Type 2 diabetes mellitus with diabetic chronic kidney disease: Secondary | ICD-10-CM | POA: Diagnosis not present

## 2018-11-22 DIAGNOSIS — I5022 Chronic systolic (congestive) heart failure: Secondary | ICD-10-CM | POA: Diagnosis not present

## 2018-11-22 DIAGNOSIS — I130 Hypertensive heart and chronic kidney disease with heart failure and stage 1 through stage 4 chronic kidney disease, or unspecified chronic kidney disease: Secondary | ICD-10-CM | POA: Diagnosis not present

## 2018-11-22 DIAGNOSIS — S72142D Displaced intertrochanteric fracture of left femur, subsequent encounter for closed fracture with routine healing: Secondary | ICD-10-CM | POA: Diagnosis not present

## 2018-11-22 DIAGNOSIS — G35 Multiple sclerosis: Secondary | ICD-10-CM | POA: Diagnosis not present

## 2018-11-22 NOTE — Telephone Encounter (Signed)
Last OV 11/04/2018. Last RF 10/12/2018

## 2018-11-22 NOTE — Telephone Encounter (Signed)
Pt needing a refill on:  diphenoxylate-atropine (LOMOTIL) 2.5-0.025 MG tablet   Please fill at:  [facility] ([address]), [place] - [place] [phone] (Phone) [phone] (Fax)   Thanks, [employer]

## 2018-11-23 ENCOUNTER — Telehealth: Payer: Self-pay | Admitting: Family Medicine

## 2018-11-23 ENCOUNTER — Other Ambulatory Visit: Payer: Self-pay

## 2018-11-23 ENCOUNTER — Encounter: Payer: Self-pay | Admitting: Orthopaedic Surgery

## 2018-11-23 ENCOUNTER — Ambulatory Visit (INDEPENDENT_AMBULATORY_CARE_PROVIDER_SITE_OTHER): Payer: Medicare Other

## 2018-11-23 ENCOUNTER — Ambulatory Visit (INDEPENDENT_AMBULATORY_CARE_PROVIDER_SITE_OTHER): Payer: Medicare Other | Admitting: Orthopaedic Surgery

## 2018-11-23 DIAGNOSIS — M25552 Pain in left hip: Secondary | ICD-10-CM

## 2018-11-23 DIAGNOSIS — S72145D Nondisplaced intertrochanteric fracture of left femur, subsequent encounter for closed fracture with routine healing: Secondary | ICD-10-CM

## 2018-11-23 NOTE — Progress Notes (Signed)
Post-Op Visit Note   Patient: [name]           Date of Birth: 09/11/1970           MRN: [id number] Visit Date: 11/23/2018 PCP: [name], MD   Assessment & Plan:  Chief Complaint:  Chief Complaint  Patient presents with  . Left Hip - Routine Post Op, Follow-up   Visit Diagnoses:  1. Closed nondisplaced intertrochanteric fracture of left femur with routine healing, subsequent encounter   2. Pain in left hip     Plan: [name] is 6 weeks status post left intertrochanteric fracture.  She is overall doing well.  She has minimal pain.  She is ambulating with a walker.  She is still getting home health PT twice a week.  She is eager to return back to the home.    Surgical scars are all fully healed.  She has no swelling.  She is ambulating slowly with a walker but without pain.  Her x-rays demonstrate stable fixation.  There is evidence of bony consolidation.  At this time she is progressing well and happy with her recovery.  She will continue with PT for strengthening.  We will see her back in 6 weeks with two-view x-rays of the left hip.  Follow-Up Instructions: Return in about 6 weeks (around 01/04/2019).   Orders:  Orders Placed This Encounter  Procedures  . XR HIP UNILAT W OR W/O PELVIS 2-3 VIEWS LEFT   No orders of the defined types were placed in this encounter.   Imaging: Xr Hip Unilat W Or W/o Pelvis 2-3 Views Left  Result Date: 11/23/2018 Stable intramedullary fixation.  There is bridging bony consolidation across the fracture.   PMFS History: Patient Active Problem List   Diagnosis Date Noted  . Fall   . Closed nondisplaced intertrochanteric fracture of left femur ([place]) 10/12/2018  . Femur fracture, left ([place]) 10/12/2018  . Hypotension 06/26/2018  . Autonomic neuropathy 03/24/2018  . Acute delirium 03/03/2018  . H/O gastric bypass 03/02/2018  . Hypertension associated with stage 3 chronic kidney disease due to type 2 diabetes mellitus  ([place]) 02/20/2018  . SI (sacroiliac) joint dysfunction 12/02/2017  . MI, acute, non ST segment elevation ([place])   . Effort angina ([place]) 06/29/2017  . Unstable angina ([place]) 06/24/2017  . Syncope 04/09/2017  . Insomnia 03/18/2017  . Ischemic cardiomyopathy   . Arthritis   . Anxiety   . Tendinitis of upper biceps tendon of right shoulder 03/16/2017  . Degenerative tear of glenoid labrum of right shoulder 03/15/2017  . Acute colitis 01/27/2017  . Hx of colonic polyps   . H/O medication noncompliance 12/14/2015  . Emesis   . Atherosclerosis of native coronary artery of native heart with stable angina pectoris ([place])   . Hypertensive heart disease   . CKD (chronic kidney disease), stage III ([place]) 06/05/2015  . Status post bariatric surgery 06/04/2015  . Colitis 06/03/2015  . Carotid stenosis 04/30/2015  . Type 2 diabetes mellitus with stage 3 chronic kidney disease, without long-term current use of insulin ([place])   . Stable angina pectoris ([place]) 04/17/2015  . Iron deficiency anemia 03/22/2015  . Vitamin B12 deficiency 02/18/2015  . Misuse of medications for pain 02/18/2015  . Major depressive disorder, recurrent, severe with psychotic features ([place]) 02/15/2015  . Helicobacter pylori infection 11/23/2014  . Hemiparesis, left ([place]) 11/23/2014  . Benign neoplasm of colon 11/20/2014  . Malignant melanoma ([place]) 08/25/2014  . Chronic systolic  CHF (congestive heart failure) ([place])   . Incomplete bladder emptying 07/12/2014  . Hypothyroidism 12/30/2013  . Aberrant subclavian artery 11/17/2013  . Multiple sclerosis ([place]) 11/02/2013  . History of CVA with residual deficit 06/20/2013  . Headache, migraine 05/29/2013  . Hyperlipidemia   . GERD (gastroesophageal reflux disease)   . Neuropathy ([place]) 01/02/2011  . Stroke ([place]) 06/21/2008  . Depression with anxiety 05/01/2008  . Essential hypertension 05/01/2008   Past Medical History:  Diagnosis Date  . Acute pyelonephritis   . Anemia    iron  deficiency anemia  . Aortic arch aneurysm ([place])   . Bipolar disorder ([place])   . BRCA negative 2014  . CAD (coronary artery disease)    a. 08/2003 Cath: LAD 30-40-med Rx; b. 11/2014 PCI: LAD 55m(3.25x23 Xience Alpine DES); c. 06/2015 PCI: D1 (2.25x12 Resolute Integrity DES); d. 06/2017 PCI: Patent mLAD stent, D2 95 (PTCA); e. 09/2017 PCI: D2 99ost (CBA); d. 12/2017 Cath: LM nl, LAD 322m80d (small), D1 40ost, D2 95ost, LCX 40p, RCA 40ost/p->Med rx for D2 given restenosis.  . CKD (chronic kidney disease), stage III   . Colon polyp   . CVA (cerebral vascular accident) ([place]   Left side weakness.   . Diabetes ([facility]  . Family history of breast cancer    BRCA neg 2014  . Gastric ulcer 04/27/2011  . History of echocardiogram    a. 03/2017 Echo: EF 60-65%, no rwma; b. 02/2018 Echo: EF 60-65%, no rwma. Nl RV fxn. No cardiac source of emboli (admitted w/ stroke).  . [place] (hypertension)   . Hyperlipemia   . Hypothyroid   . Malignant melanoma of skin of scalp (HCLuz[place]  . MI, acute, non ST segment elevation ([facility]  . Neuromuscular disorder ([facility]CLittle[place]  . Orthostatic hypotension   . S/P drug eluting coronary stent placement 06/04/2015  . Sepsis (HCEllenton2/14/2019  . Stroke ([facility]   a. 02/2018 MRI: 69m19mate acute/early subacute L medial frontal lobe inarct; b. 02/2018 MRA No large vessel occlusion or aneurysm. Mod to sev L P2 stenosis. thready L vertebral artery, diffusely dzs'd; c. 02/2018 Carotid U/S: <50% bilat ICA dzs.    Family History  Problem Relation Age of Onset  . Hypertension Mother   . Anxiety disorder Mother   . Depression Mother   . Bipolar disorder Mother   . Heart disease Mother        No details  . Hyperlipidemia Mother   . Kidney disease Father   . Heart disease Father 40 58 Hypertension Father   . Diabetes Father   . Stroke Father   . Colon cancer Father        dx in his 40'58's Anxiety disorder Father   . Depression Father   . Skin cancer Father   . Kidney disease Sister   . Thyroid  nodules Sister   . Hypertension Sister   . Hypertension Sister   . Diabetes Sister   . Hyperlipidemia Sister   . Depression Sister   . Breast cancer Maternal Aunt 50 67 Breast cancer Maternal Aunt 50 64 Ovarian cancer Cousin   . Colon cancer Cousin   . Breast cancer Other   . Kidney cancer Neg Hx   . Bladder Cancer Neg Hx     Past Surgical History:  Procedure Laterality Date  . APPENDECTOMY    . CARDIAC CATHETERIZATION N/A 11/09/2014   Procedure: Coronary Angiography;  Surgeon: TimKathlene November  [name], MD;  Location: [facility];  Service: Cardiovascular;  Laterality: N/A;  . CARDIAC CATHETERIZATION N/A 11/12/2014   Procedure: Coronary Stent Intervention;  Surgeon: Isaias Cowman, MD;  Location: [facility];  Service: Cardiovascular;  Laterality: N/A;  . CARDIAC CATHETERIZATION N/A 04/18/2015   Procedure: Left Heart Cath and Coronary Angiography;  Surgeon: Minna Merritts, MD;  Location: [facility];  Service: Cardiovascular;  Laterality: N/A;  . CARDIAC CATHETERIZATION Left 06/04/2015   Procedure: Left Heart Cath and Coronary Angiography;  Surgeon: Wellington Hampshire, MD;  Location: [facility];  Service: Cardiovascular;  Laterality: Left;  . CARDIAC CATHETERIZATION N/A 06/04/2015   Procedure: Coronary Stent Intervention;  Surgeon: Wellington Hampshire, MD;  Location: [facility];  Service: Cardiovascular;  Laterality: N/A;  . CESAREAN SECTION  2001  . CHOLECYSTECTOMY N/A 11/18/2016   Procedure: LAPAROSCOPIC CHOLECYSTECTOMY WITH INTRAOPERATIVE CHOLANGIOGRAM;  Surgeon: Christene Lye, MD;  Location: [facility];  Service: General;  Laterality: N/A;  . COLONOSCOPY WITH PROPOFOL N/A 04/27/2016   Procedure: COLONOSCOPY WITH PROPOFOL;  Surgeon: [name], MD;  Location: [facility];  Service: Endoscopy;  Laterality: N/A;  . COLONOSCOPY WITH PROPOFOL N/A 01/12/2018   Procedure: COLONOSCOPY WITH PROPOFOL;  Surgeon: Tol[place], Benay Pike, MD;   Location: [facility];  Service: Endoscopy;  Laterality: N/A;  . CORONARY ANGIOPLASTY    . CORONARY BALLOON ANGIOPLASTY N/A 06/29/2017   Procedure: CORONARY BALLOON ANGIOPLASTY;  Surgeon: Wellington Hampshire, MD;  Location: [facility];  Service: Cardiovascular;  Laterality: N/A;  . CORONARY BALLOON ANGIOPLASTY N/A 09/20/2017   Procedure: CORONARY BALLOON ANGIOPLASTY;  Surgeon: Wellington Hampshire, MD;  Location: [facility];  Service: Cardiovascular;  Laterality: N/A;  . DILATION AND CURETTAGE OF UTERUS    . ESOPHAGOGASTRODUODENOSCOPY (EGD) WITH PROPOFOL N/A 09/14/2014   Procedure: ESOPHAGOGASTRODUODENOSCOPY (EGD) WITH PROPOFOL;  Surgeon: Josefine Class, MD;  Location: [facility];  Service: Endoscopy;  Laterality: N/A;  . ESOPHAGOGASTRODUODENOSCOPY (EGD) WITH PROPOFOL N/A 04/27/2016   Procedure: ESOPHAGOGASTRODUODENOSCOPY (EGD) WITH PROPOFOL;  Surgeon: [name], MD;  Location: [facility];  Service: Endoscopy;  Laterality: N/A;  Diabetic - oral meds  . ESOPHAGOGASTRODUODENOSCOPY (EGD) WITH PROPOFOL N/A 01/12/2018   Procedure: ESOPHAGOGASTRODUODENOSCOPY (EGD) WITH PROPOFOL;  Surgeon: Toledo, Benay Pike, MD;  Location: [facility];  Service: Endoscopy;  Laterality: N/A;  . GASTRIC BYPASS  09/2009   [place] (IM) NAIL INTERTROCHANTERIC Left 10/13/2018   Procedure: INTRAMEDULLARY (IM) NAIL INTERTROCHANTRIC;  Surgeon: Leandrew Koyanagi, MD;  Location: [facility];  Service: Orthopedics;  Laterality: Left;  . Left Carotid to sublcavian artery bypass w/ subclavian artery ligation     a. Performed @ [facility].  . LEFT HEART CATH AND CORONARY ANGIOGRAPHY Left 06/29/2017   Procedure: LEFT HEART CATH AND CORONARY ANGIOGRAPHY;  Surgeon: Wellington Hampshire, MD;  Location: [facility];  Service: Cardiovascular;  Laterality: Left;  . LEFT HEART CATH AND CORONARY ANGIOGRAPHY N/A 09/20/2017   Procedure: LEFT HEART CATH AND CORONARY ANGIOGRAPHY;   Surgeon: Wellington Hampshire, MD;  Location: [facility];  Service: Cardiovascular;  Laterality: N/A;  . LEFT HEART CATH AND CORONARY ANGIOGRAPHY N/A 12/20/2017   Procedure: LEFT HEART CATH AND CORONARY ANGIOGRAPHY;  Surgeon: Wellington Hampshire, MD;  Location: [facility];  Service: Cardiovascular;  Laterality: N/A;  . MELANOMA EXCISION  2016   Dr. Evorn Gong  . [place]  2002  .  RIGHT OOPHORECTOMY    . SHOULDER ARTHROSCOPY WITH OPEN ROTATOR CUFF REPAIR Right 01/07/2016   Procedure: SHOULDER ARTHROSCOPY WITH DEBRIDMENT, SUBACHROMIAL DECOMPRESSION;  Surgeon: Corky Mull, MD;  Location: [facility];  Service: Orthopedics;  Laterality: Right;  . SHOULDER ARTHROSCOPY WITH OPEN ROTATOR CUFF REPAIR Right 03/16/2017   Procedure: SHOULDER ARTHROSCOPY WITH OPEN ROTATOR CUFF REPAIR POSSIBLE BICEPS TENODESIS;  Surgeon: Corky Mull, MD;  Location: [facility];  Service: Orthopedics;  Laterality: Right;  . TRIGGER FINGER RELEASE Right     [place]   Social History   Occupational History  . 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 Disabled through 08/02/2013 per forms 02/03/2013  Tobacco Use  . Smoking status: Former Smoker    Types: Cigarettes    Quit date: 08/31/1994    Years since quitting: 24.2  . Smokeless tobacco: Never Used  . Tobacco comment: quit 28 years ago  Substance and Sexual Activity  . Alcohol use: No    Alcohol/week: 0.0 standard drinks  . Drug use: No  . Sexual activity: Not Currently    Birth control/protection: None

## 2018-11-23 NOTE — Chronic Care Management (AMB) (Signed)
Chronic Care Management   Note  11/23/2018 Name: [name] MRN: [id number] DOB: 07-25-1970  [name] is a 48 y.o. year old female who is a primary care patient of Fisher, Kirstie Peri, MD. I reached out to [facility] by phone today in response to a referral sent by [name] health plan.     [name] was given information about Chronic Care Management services today including:  1. CCM service includes personalized support from designated clinical staff supervised by her physician, including individualized plan of care and coordination with other care providers 2. 24/7 contact phone numbers for assistance for urgent and routine care needs. 3. Service will only be billed when office clinical staff spend 20 minutes or more in a month to coordinate care. 4. Only one practitioner may furnish and bill the service in a calendar month. 5. The patient may stop CCM services at any time (effective at the end of the month) by phone call to the office staff. 6. The patient will be responsible for cost sharing (co-pay) of up to 20% of the service fee (after annual deductible is met).  Patient agreed to services and verbal consent obtained.   Follow up plan: Telephone appointment with CCM team member scheduled for: 12/06/2018  [place]  ??[email or web address]   ??[phone]

## 2018-11-24 MED ORDER — DIPHENOXYLATE-ATROPINE 2.5-0.025 MG PO TABS: ORAL_TABLET | ORAL | 3 refills | Status: DC

## 2018-11-25 DIAGNOSIS — S72142D Displaced intertrochanteric fracture of left femur, subsequent encounter for closed fracture with routine healing: Secondary | ICD-10-CM | POA: Diagnosis not present

## 2018-11-25 DIAGNOSIS — E1122 Type 2 diabetes mellitus with diabetic chronic kidney disease: Secondary | ICD-10-CM | POA: Diagnosis not present

## 2018-11-25 DIAGNOSIS — I5022 Chronic systolic (congestive) heart failure: Secondary | ICD-10-CM | POA: Diagnosis not present

## 2018-11-25 DIAGNOSIS — G35 Multiple sclerosis: Secondary | ICD-10-CM | POA: Diagnosis not present

## 2018-11-25 DIAGNOSIS — I130 Hypertensive heart and chronic kidney disease with heart failure and stage 1 through stage 4 chronic kidney disease, or unspecified chronic kidney disease: Secondary | ICD-10-CM | POA: Diagnosis not present

## 2018-11-25 DIAGNOSIS — I2510 Atherosclerotic heart disease of native coronary artery without angina pectoris: Secondary | ICD-10-CM | POA: Diagnosis not present

## 2018-11-28 ENCOUNTER — Telehealth: Payer: Self-pay | Admitting: Family Medicine

## 2018-11-28 NOTE — Telephone Encounter (Signed)
[name] [phone] w/ [facility], secure message requesting verbal order for PT 1 X 4week

## 2018-11-28 NOTE — Telephone Encounter (Signed)
That's fine

## 2018-11-29 NOTE — Telephone Encounter (Signed)
Verbal orders given  

## 2018-12-01 DIAGNOSIS — G35 Multiple sclerosis: Secondary | ICD-10-CM | POA: Diagnosis not present

## 2018-12-01 DIAGNOSIS — N2581 Secondary hyperparathyroidism of renal origin: Secondary | ICD-10-CM | POA: Diagnosis not present

## 2018-12-01 DIAGNOSIS — Z9049 Acquired absence of other specified parts of digestive tract: Secondary | ICD-10-CM | POA: Diagnosis not present

## 2018-12-01 DIAGNOSIS — E1122 Type 2 diabetes mellitus with diabetic chronic kidney disease: Secondary | ICD-10-CM | POA: Diagnosis not present

## 2018-12-01 DIAGNOSIS — Z9181 History of falling: Secondary | ICD-10-CM | POA: Diagnosis not present

## 2018-12-01 DIAGNOSIS — I2510 Atherosclerotic heart disease of native coronary artery without angina pectoris: Secondary | ICD-10-CM | POA: Diagnosis not present

## 2018-12-01 DIAGNOSIS — Z87891 Personal history of nicotine dependence: Secondary | ICD-10-CM | POA: Diagnosis not present

## 2018-12-01 DIAGNOSIS — Z9884 Bariatric surgery status: Secondary | ICD-10-CM | POA: Diagnosis not present

## 2018-12-01 DIAGNOSIS — N182 Chronic kidney disease, stage 2 (mild): Secondary | ICD-10-CM | POA: Diagnosis not present

## 2018-12-01 DIAGNOSIS — F419 Anxiety disorder, unspecified: Secondary | ICD-10-CM | POA: Diagnosis not present

## 2018-12-01 DIAGNOSIS — D631 Anemia in chronic kidney disease: Secondary | ICD-10-CM | POA: Diagnosis not present

## 2018-12-01 DIAGNOSIS — Z85820 Personal history of malignant melanoma of skin: Secondary | ICD-10-CM | POA: Diagnosis not present

## 2018-12-01 DIAGNOSIS — Z6827 Body mass index (BMI) 27.0-27.9, adult: Secondary | ICD-10-CM | POA: Diagnosis not present

## 2018-12-01 DIAGNOSIS — Z7902 Long term (current) use of antithrombotics/antiplatelets: Secondary | ICD-10-CM | POA: Diagnosis not present

## 2018-12-01 DIAGNOSIS — N1832 Chronic kidney disease, stage 3b: Secondary | ICD-10-CM | POA: Diagnosis not present

## 2018-12-01 DIAGNOSIS — I10 Essential (primary) hypertension: Secondary | ICD-10-CM | POA: Diagnosis not present

## 2018-12-01 DIAGNOSIS — I130 Hypertensive heart and chronic kidney disease with heart failure and stage 1 through stage 4 chronic kidney disease, or unspecified chronic kidney disease: Secondary | ICD-10-CM | POA: Diagnosis not present

## 2018-12-01 DIAGNOSIS — Z7984 Long term (current) use of oral hypoglycemic drugs: Secondary | ICD-10-CM | POA: Diagnosis not present

## 2018-12-01 DIAGNOSIS — Z9089 Acquired absence of other organs: Secondary | ICD-10-CM | POA: Diagnosis not present

## 2018-12-01 DIAGNOSIS — E1140 Type 2 diabetes mellitus with diabetic neuropathy, unspecified: Secondary | ICD-10-CM | POA: Diagnosis not present

## 2018-12-01 DIAGNOSIS — E039 Hypothyroidism, unspecified: Secondary | ICD-10-CM | POA: Diagnosis not present

## 2018-12-01 DIAGNOSIS — K5900 Constipation, unspecified: Secondary | ICD-10-CM | POA: Diagnosis not present

## 2018-12-01 DIAGNOSIS — I69354 Hemiplegia and hemiparesis following cerebral infarction affecting left non-dominant side: Secondary | ICD-10-CM | POA: Diagnosis not present

## 2018-12-01 DIAGNOSIS — F319 Bipolar disorder, unspecified: Secondary | ICD-10-CM | POA: Diagnosis not present

## 2018-12-01 DIAGNOSIS — I5022 Chronic systolic (congestive) heart failure: Secondary | ICD-10-CM | POA: Diagnosis not present

## 2018-12-01 DIAGNOSIS — W19XXXD Unspecified fall, subsequent encounter: Secondary | ICD-10-CM | POA: Diagnosis not present

## 2018-12-01 DIAGNOSIS — S72142D Displaced intertrochanteric fracture of left femur, subsequent encounter for closed fracture with routine healing: Secondary | ICD-10-CM | POA: Diagnosis not present

## 2018-12-01 DIAGNOSIS — E785 Hyperlipidemia, unspecified: Secondary | ICD-10-CM | POA: Diagnosis not present

## 2018-12-02 IMAGING — US US BREAST*L* LIMITED INC AXILLA
1 series · 6 of 6 positions shown · non-contrast
Comparison: 12/13/2015

CLINICAL DATA: Patient has a palpable abnormality in the
retroareolar region of the right breast. Focal tenderness in the
retroareolar region of the left breast.Recent weight loss of greater
than 20 pounds.

EXAM:
2D DIGITAL DIAGNOSTIC BILATERAL MAMMOGRAM WITH CAD AND ADJUNCT TOMO
ULTRASOUND BILATERAL BREAST

[Series 1: us breast*left* limited inc axilla · 0.06mm/px · 6 of 6 slices shown]
[im 1/6]
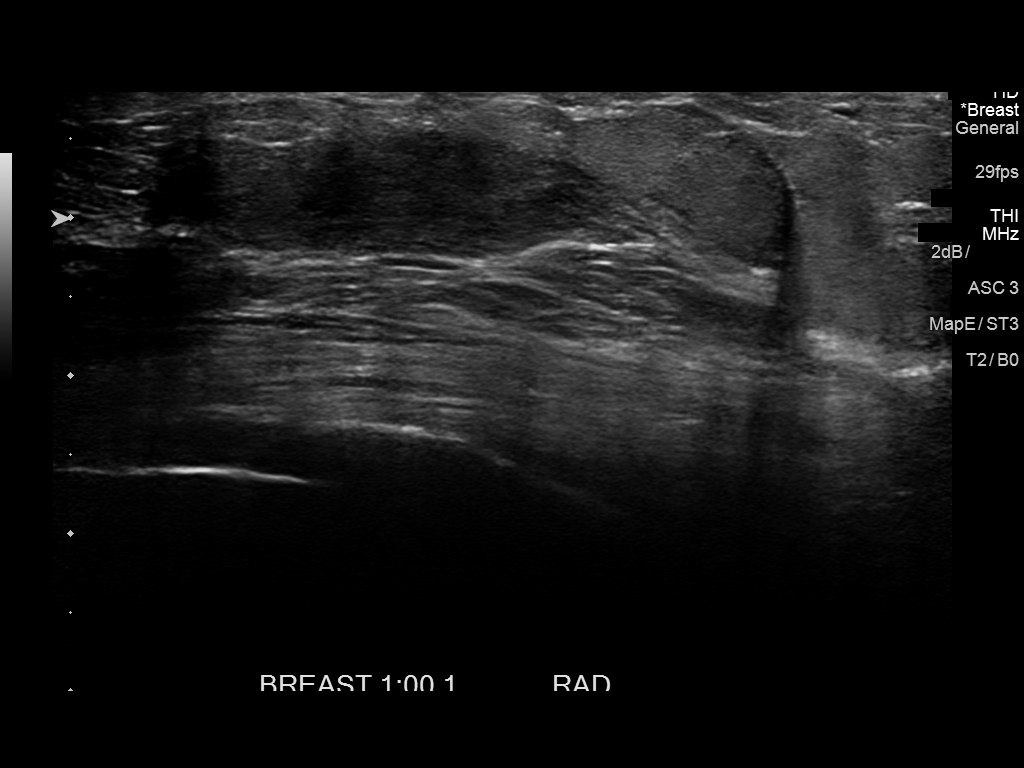
[im 2/6]
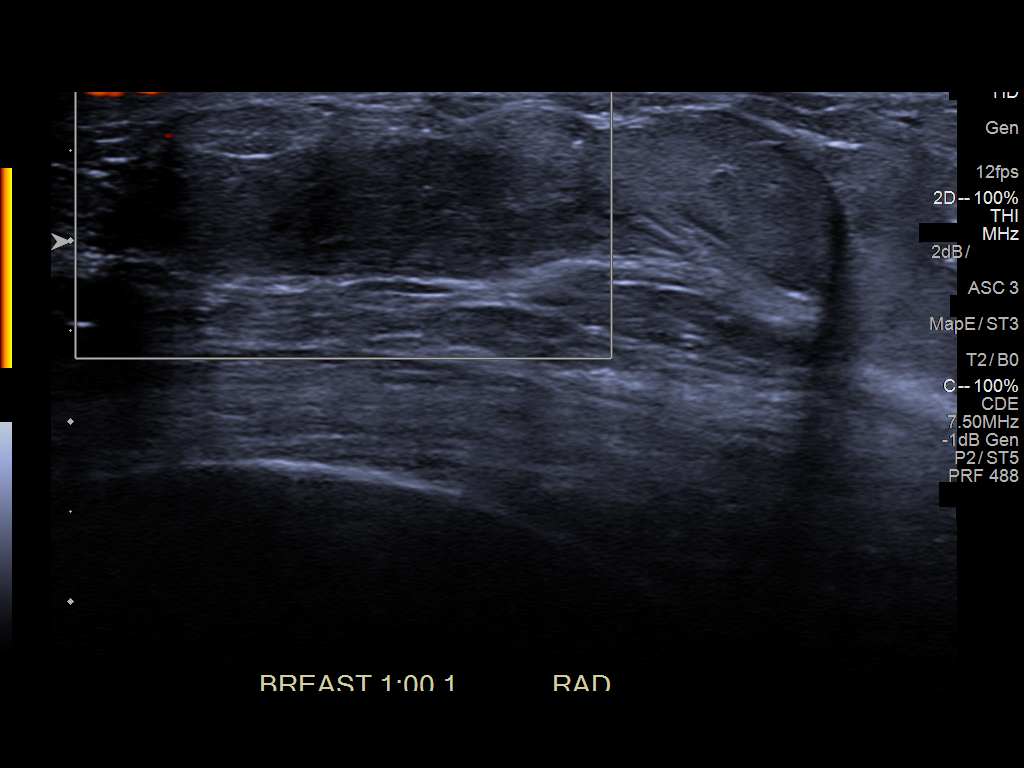
[im 3/6]
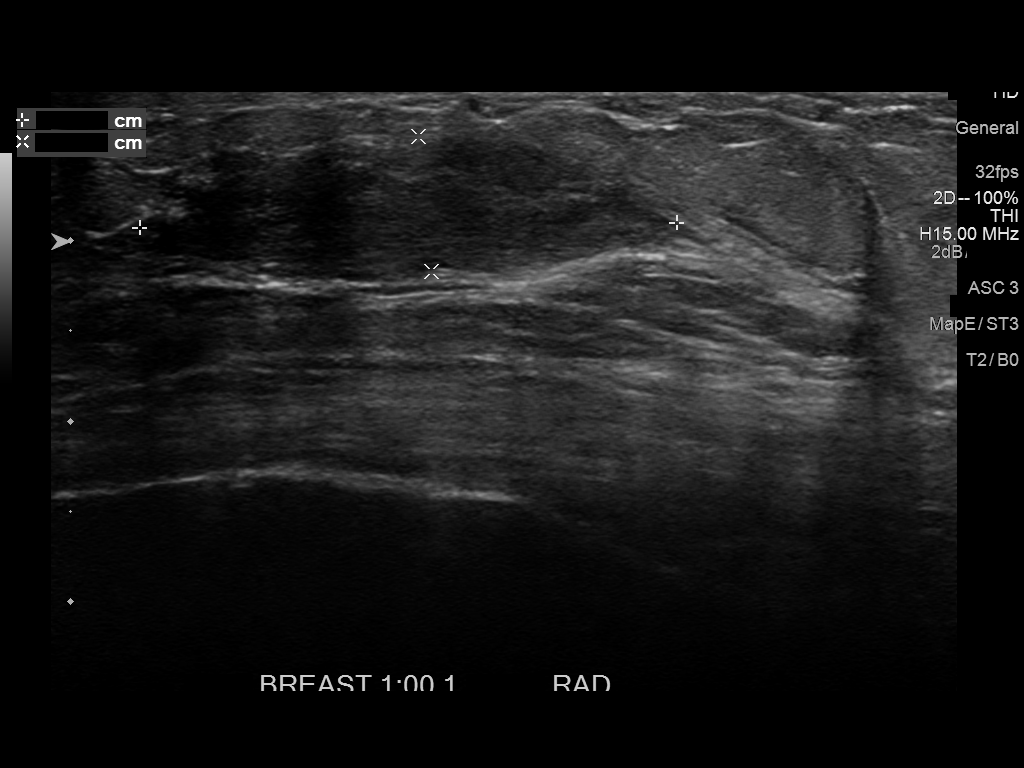
[im 4/6]
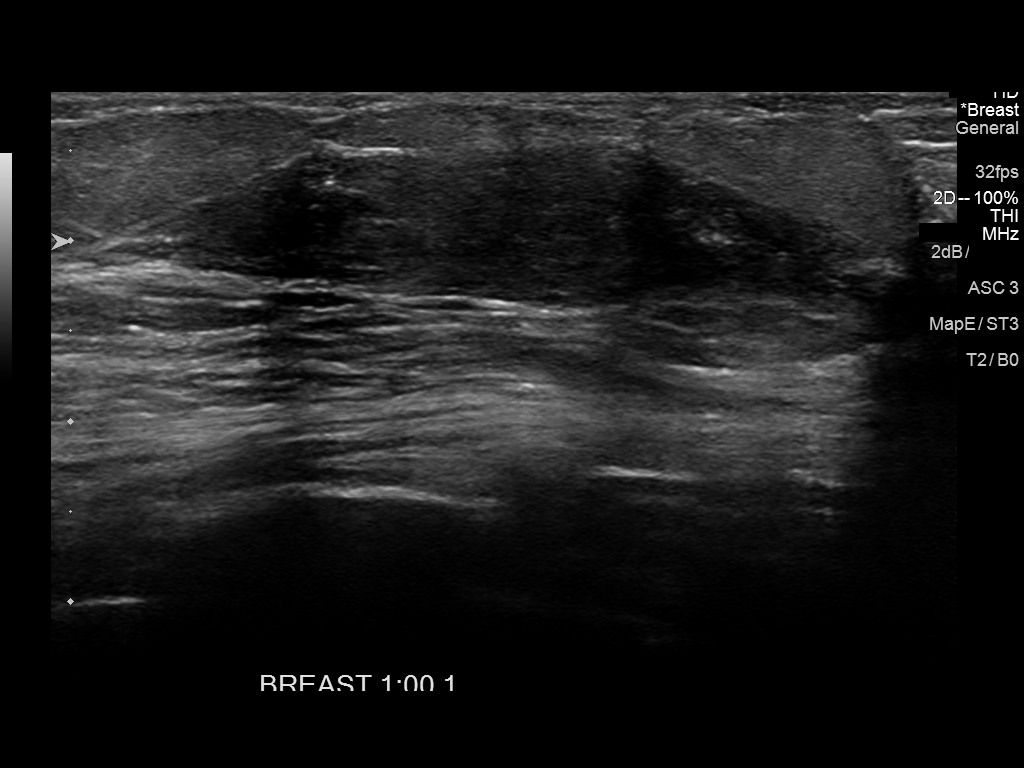
[im 5/6]
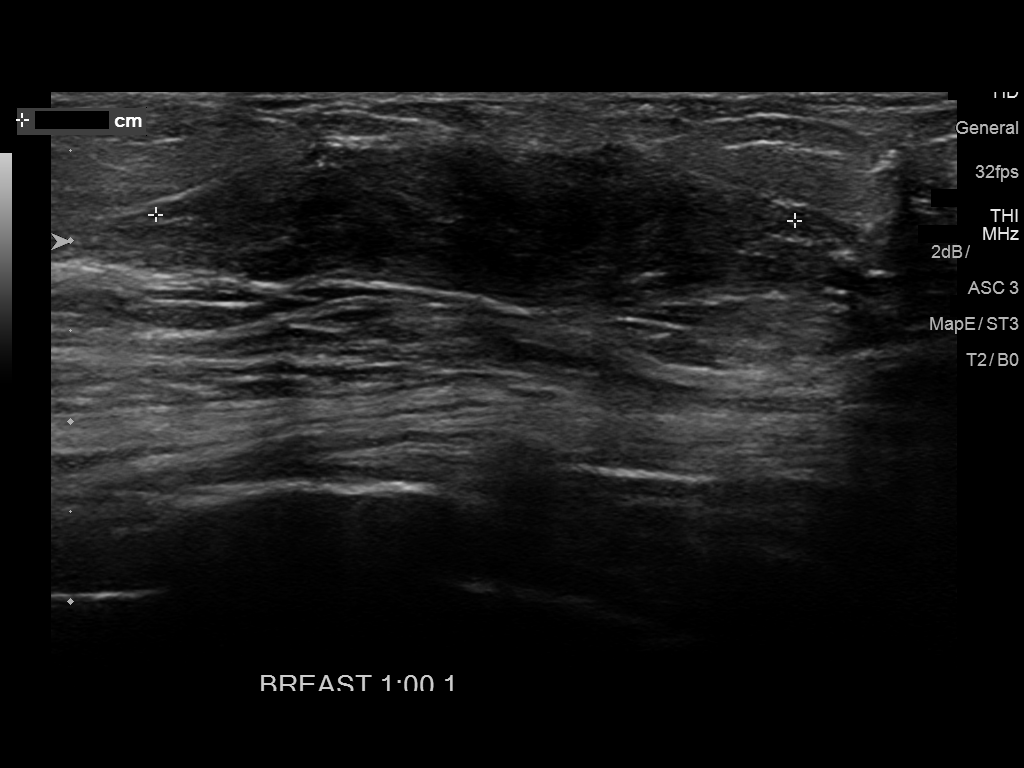
[im 6/6]
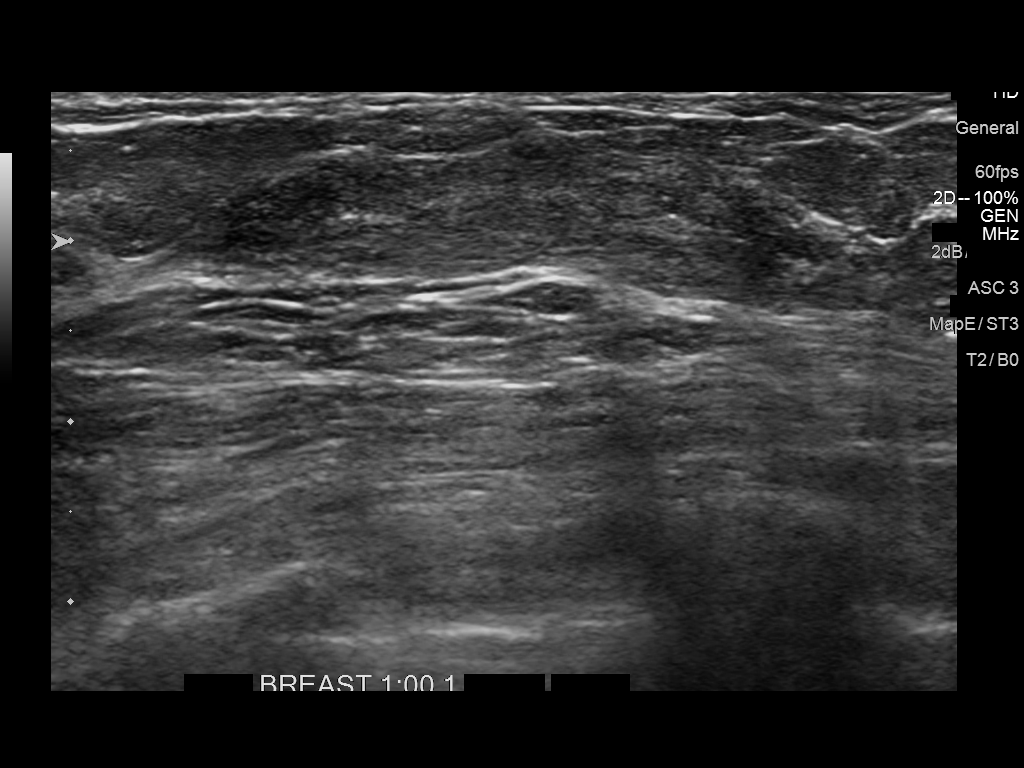

[6 of 6 positions shown; findings below may reference images not displayed]

ACR Breast Density Category c: The breast tissue is heterogeneously
dense, which may obscure small masses.
FINDINGS: No suspicious mass, distortion, or microcalcifications are
identified to suggest presence of malignancy. A BB marks area of
patient's concern in the right breast, corresponding to island of
fibroglandular tissue in the upper-outer quadrant, retroareolar
region. BB marks area of patient's concern in the left breast,
corresponding to fibroglandular tissue in the upper-outer quadrant
retroareolar region.

Mammographic images were processed with CAD.

On physical exam, I palpate discrete soft mass in the upper-outer
quadrant of the right breast. I palpate a similar discrete soft mass
in the upper-outer quadrant of the left breast.

Targeted ultrasound is performed, showing normal appearing
fibroglandular tissue in the 11 o'clock location of the right breast
1 cm from the nipple. Normal appearing fibroglandular tissue is
imaged in the 1 o'clock location of the left breast 1 cm from the
nipple. No mass, distortion, or acoustic shadowing is demonstrated
with ultrasound.
IMPRESSION: Clinical findings are consistent with benign breast parenchyma
bilaterally. There is no evidence for malignancy in either breast.

RECOMMENDATION:
Screening mammogram is recommended in December 2016.

I have discussed the findings and recommendations with the patient.
Results were also provided in writing at the conclusion of the
visit. If applicable, a reminder letter will be sent to the patient
regarding the next appointment.

BI-RADS CATEGORY  1: Negative.

## 2018-12-02 IMAGING — US US BREAST*R* LIMITED INC AXILLA
1 series · 6 of 6 positions shown · non-contrast
Comparison: 12/13/2015

CLINICAL DATA: Patient has a palpable abnormality in the
retroareolar region of the right breast. Focal tenderness in the
retroareolar region of the left breast.Recent weight loss of greater
than 20 pounds.

EXAM:
2D DIGITAL DIAGNOSTIC BILATERAL MAMMOGRAM WITH CAD AND ADJUNCT TOMO
ULTRASOUND BILATERAL BREAST

[Series 1: us breast*right* limited inc axilla · 0.06mm/px · 6 of 6 slices shown]
[im 1/6]
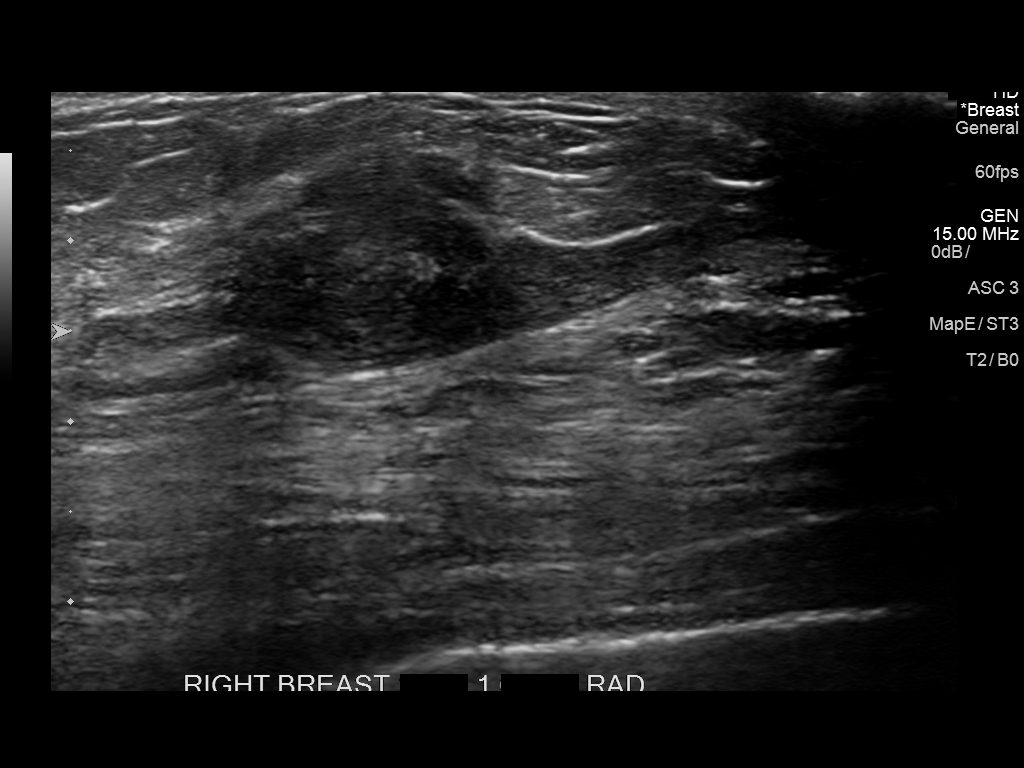
[im 2/6]
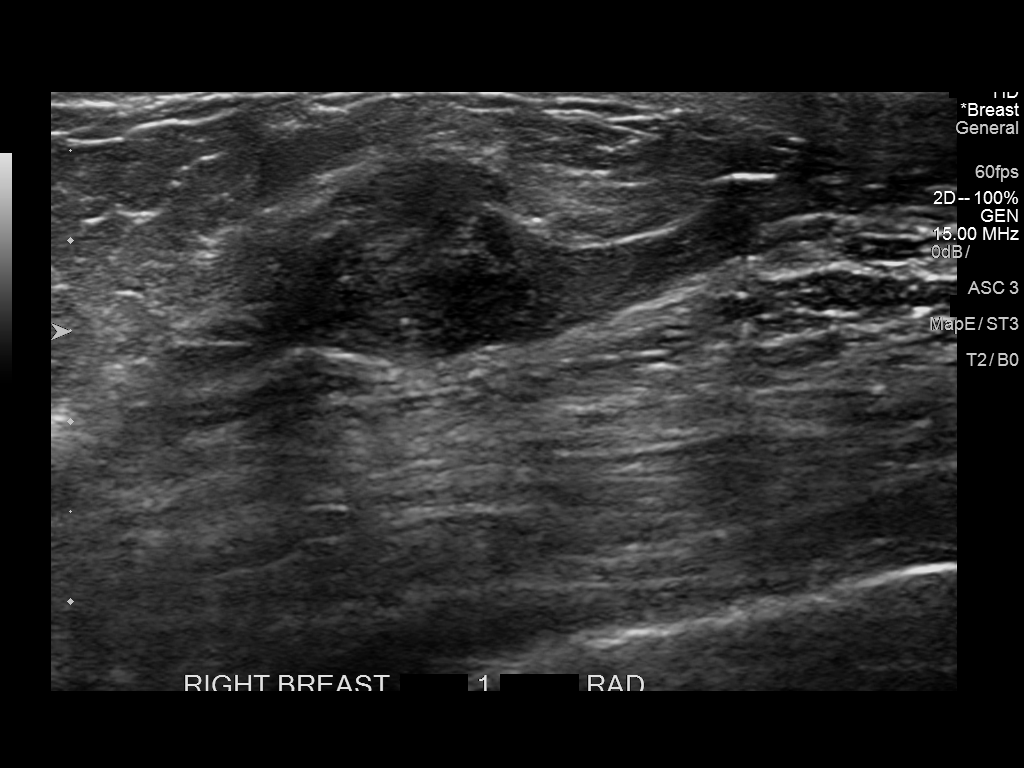
[im 3/6]
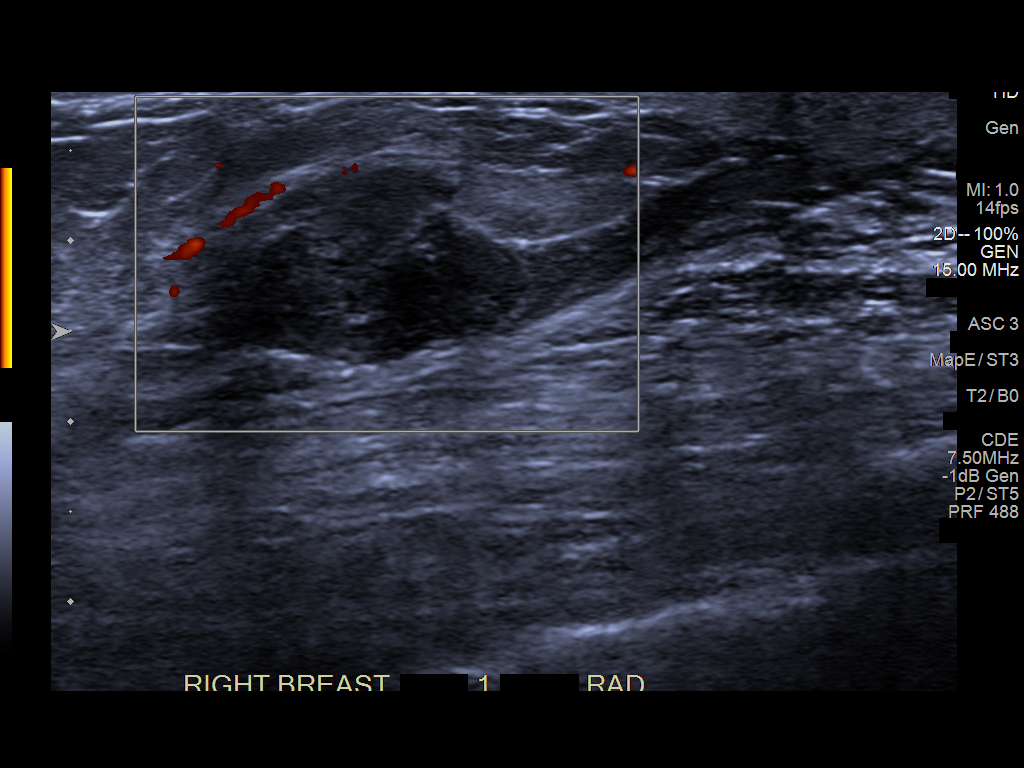
[im 4/6]
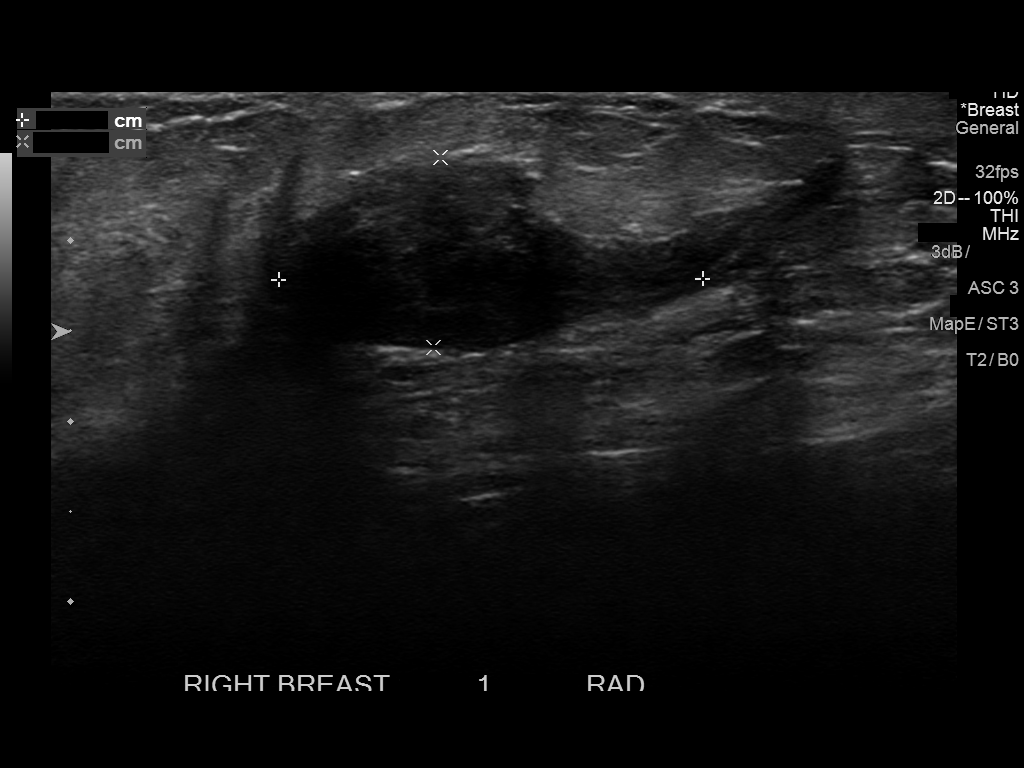
[im 5/6]
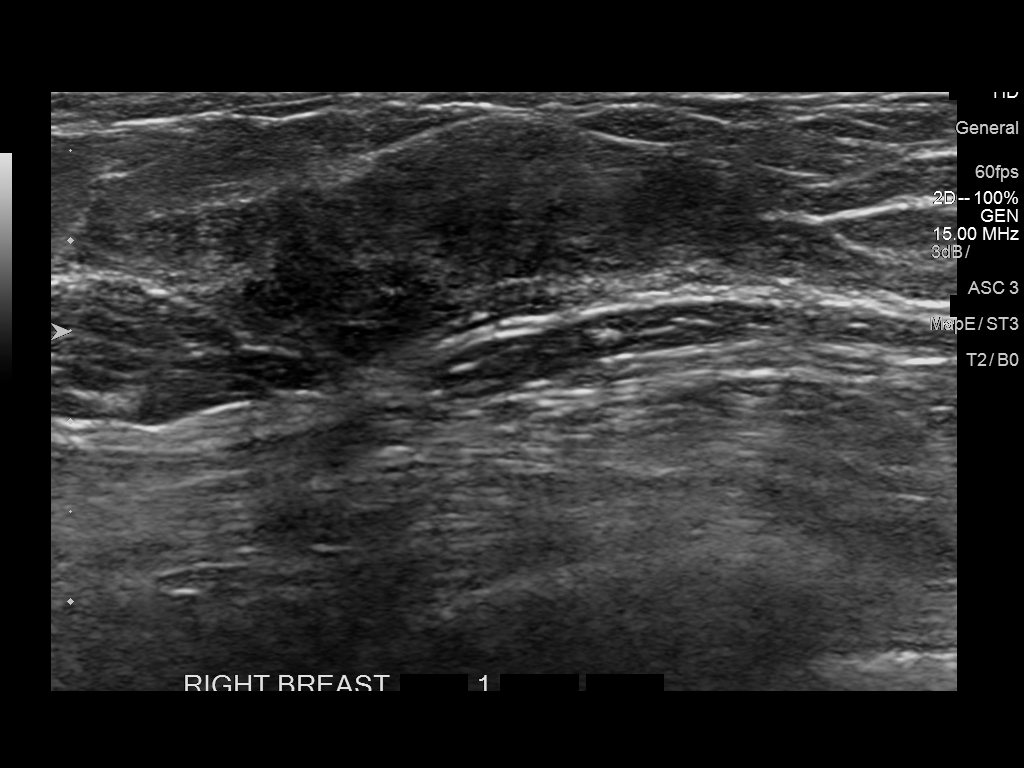
[im 6/6]
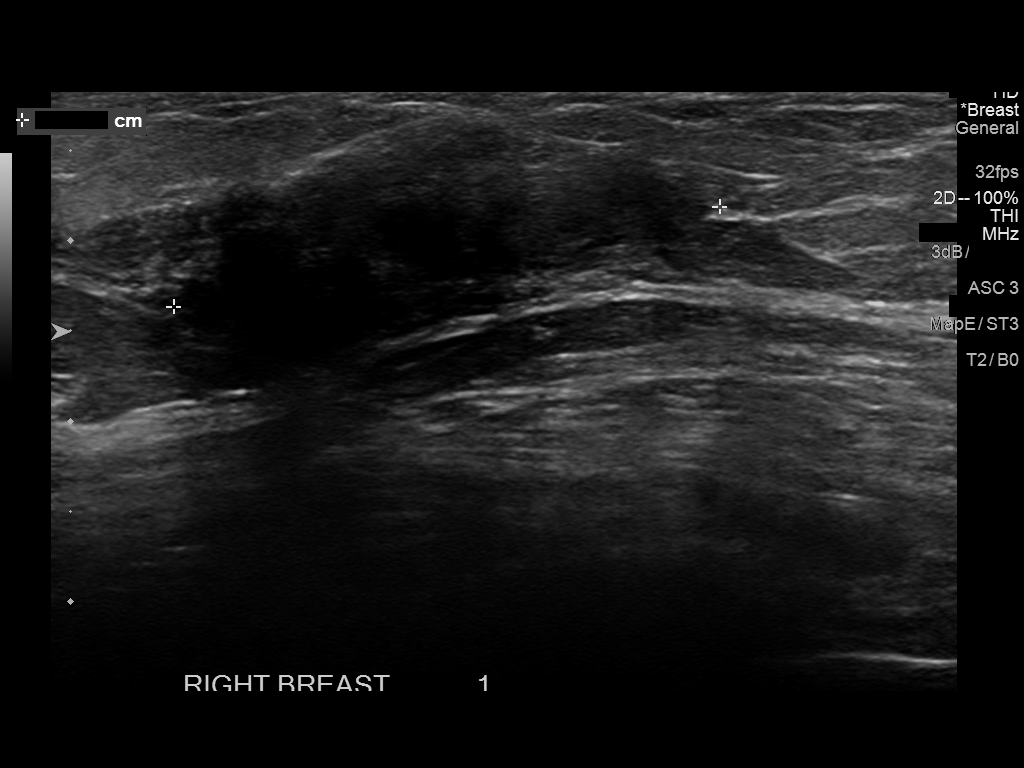

[6 of 6 positions shown; findings below may reference images not displayed]

ACR Breast Density Category c: The breast tissue is heterogeneously
dense, which may obscure small masses.
FINDINGS: No suspicious mass, distortion, or microcalcifications are
identified to suggest presence of malignancy. A BB marks area of
patient's concern in the right breast, corresponding to island of
fibroglandular tissue in the upper-outer quadrant, retroareolar
region. BB marks area of patient's concern in the left breast,
corresponding to fibroglandular tissue in the upper-outer quadrant
retroareolar region.

Mammographic images were processed with CAD.

On physical exam, I palpate discrete soft mass in the upper-outer
quadrant of the right breast. I palpate a similar discrete soft mass
in the upper-outer quadrant of the left breast.

Targeted ultrasound is performed, showing normal appearing
fibroglandular tissue in the 11 o'clock location of the right breast
1 cm from the nipple. Normal appearing fibroglandular tissue is
imaged in the 1 o'clock location of the left breast 1 cm from the
nipple. No mass, distortion, or acoustic shadowing is demonstrated
with ultrasound.
IMPRESSION: Clinical findings are consistent with benign breast parenchyma
bilaterally. There is no evidence for malignancy in either breast.

RECOMMENDATION:
Screening mammogram is recommended in December 2016.

I have discussed the findings and recommendations with the patient.
Results were also provided in writing at the conclusion of the
visit. If applicable, a reminder letter will be sent to the patient
regarding the next appointment.

BI-RADS CATEGORY  1: Negative.

## 2018-12-06 ENCOUNTER — Ambulatory Visit: Payer: Self-pay

## 2018-12-06 ENCOUNTER — Telehealth: Payer: Medicare Other

## 2018-12-06 NOTE — Chronic Care Management (AMB) (Signed)
  Chronic Care Management   Outreach Note  12/06/2018 Name: [name] MRN: [id number] DOB: 1970/05/22  Primary Care Provider: [name], MD  An unsuccessful telephone outreach was attempted today. [name] was referred to the case management team for assistance with care management and care coordination. I was unable to leave a voice message due to her voice mailbox being full.  Follow Up Plan: The care management team will reach out to her again within the next two weeks.   [facility] Care Management [phone]

## 2018-12-08 ENCOUNTER — Telehealth: Payer: Self-pay | Admitting: Orthopaedic Surgery

## 2018-12-08 DIAGNOSIS — I2510 Atherosclerotic heart disease of native coronary artery without angina pectoris: Secondary | ICD-10-CM | POA: Diagnosis not present

## 2018-12-08 DIAGNOSIS — G35 Multiple sclerosis: Secondary | ICD-10-CM | POA: Diagnosis not present

## 2018-12-08 DIAGNOSIS — I130 Hypertensive heart and chronic kidney disease with heart failure and stage 1 through stage 4 chronic kidney disease, or unspecified chronic kidney disease: Secondary | ICD-10-CM | POA: Diagnosis not present

## 2018-12-08 DIAGNOSIS — E1122 Type 2 diabetes mellitus with diabetic chronic kidney disease: Secondary | ICD-10-CM | POA: Diagnosis not present

## 2018-12-08 DIAGNOSIS — S72142D Displaced intertrochanteric fracture of left femur, subsequent encounter for closed fracture with routine healing: Secondary | ICD-10-CM | POA: Diagnosis not present

## 2018-12-08 DIAGNOSIS — I5022 Chronic systolic (congestive) heart failure: Secondary | ICD-10-CM | POA: Diagnosis not present

## 2018-12-08 NOTE — Telephone Encounter (Signed)
Spoke with patient regarding her recent fall. She is unable to come before next Tuesday. I scheduled patient.

## 2018-12-08 NOTE — Telephone Encounter (Signed)
Donell Beers a home health therapist with [facility] called the triage phone. She said that Dr.Xu is currently treating patient for a Left hip fracture IM nailing. She is now almost two months out from surgery. She fell at home outside on Tuesday and had to have assistance getting up. She is complaining of more pain with weightbearing since the fall. Her next appointment isnt until 01/04/2019. Mel Almond wants to know if patient should be evaluated sooner?

## 2018-12-09 ENCOUNTER — Ambulatory Visit (INDEPENDENT_AMBULATORY_CARE_PROVIDER_SITE_OTHER): Payer: Medicare Other | Admitting: Family Medicine

## 2018-12-09 ENCOUNTER — Encounter: Payer: Self-pay | Admitting: Family Medicine

## 2018-12-09 ENCOUNTER — Other Ambulatory Visit: Payer: Self-pay

## 2018-12-09 VITALS — BP 158/92 | HR 85 | Temp 97.5°F | Wt 156.2 lb

## 2018-12-09 DIAGNOSIS — F321 Major depressive disorder, single episode, moderate: Secondary | ICD-10-CM | POA: Diagnosis not present

## 2018-12-09 DIAGNOSIS — I255 Ischemic cardiomyopathy: Secondary | ICD-10-CM

## 2018-12-09 DIAGNOSIS — F419 Anxiety disorder, unspecified: Secondary | ICD-10-CM | POA: Diagnosis not present

## 2018-12-09 DIAGNOSIS — E1122 Type 2 diabetes mellitus with diabetic chronic kidney disease: Secondary | ICD-10-CM | POA: Diagnosis not present

## 2018-12-09 DIAGNOSIS — N183 Chronic kidney disease, stage 3 unspecified: Secondary | ICD-10-CM | POA: Diagnosis not present

## 2018-12-09 DIAGNOSIS — I129 Hypertensive chronic kidney disease with stage 1 through stage 4 chronic kidney disease, or unspecified chronic kidney disease: Secondary | ICD-10-CM | POA: Diagnosis not present

## 2018-12-09 DIAGNOSIS — E1121 Type 2 diabetes mellitus with diabetic nephropathy: Secondary | ICD-10-CM | POA: Diagnosis not present

## 2018-12-09 DIAGNOSIS — E782 Mixed hyperlipidemia: Secondary | ICD-10-CM | POA: Diagnosis not present

## 2018-12-09 DIAGNOSIS — N1831 Chronic kidney disease, stage 3a: Secondary | ICD-10-CM

## 2018-12-09 DIAGNOSIS — D509 Iron deficiency anemia, unspecified: Secondary | ICD-10-CM

## 2018-12-09 DIAGNOSIS — Z9884 Bariatric surgery status: Secondary | ICD-10-CM | POA: Diagnosis not present

## 2018-12-09 DIAGNOSIS — E039 Hypothyroidism, unspecified: Secondary | ICD-10-CM | POA: Diagnosis not present

## 2018-12-09 DIAGNOSIS — E669 Obesity, unspecified: Secondary | ICD-10-CM | POA: Diagnosis not present

## 2018-12-09 DIAGNOSIS — E1142 Type 2 diabetes mellitus with diabetic polyneuropathy: Secondary | ICD-10-CM | POA: Diagnosis not present

## 2018-12-09 MED ORDER — ALPRAZOLAM 1 MG PO TABS: ORAL_TABLET | ORAL | 3 refills | Status: DC

## 2018-12-09 MED ORDER — FERROUS SULFATE 325 (65 FE) MG PO TBEC: 325.0000 mg | DELAYED_RELEASE_TABLET | Freq: Every day | ORAL | Status: DC

## 2018-12-09 NOTE — Progress Notes (Signed)
Patient: [name] Female    DOB: 07-Oct-1970   48 y.o.   MRN: [id number] Visit Date: 12/09/2018  Today's Provider: [name], MD   Chief Complaint  Patient presents with  . Anemia Follow-Up   Subjective:     HPI  Anemia: Patient presents today for a 1 month follow up for iron deficient anemia since iron was restarted after hospitalizatio for fractured hip in September. She has not cut back to one tablet daily . Last OV was on 11/04/2018. Last ferritin was 43 on 11/04/2018.  Patient advised to take iron supplements 325 mg daily and follow up in 1 month. Patient reports good compliance with treatment plan. She reports nausea when taking iron supplements.  She states Dr Holley Raring did labs last week and she thinks he checked blood count, but has not heard anything about results.   She also reports she has been more depressed since breaking her hip due to limited mobility and not being able to drive. She has also been more anxious with her heart racing and feeling tight which is relieved with alprazolam, so she has been taking 2 tablet a day on occasion. She continues on bupropion and venlafaxine.  Lab Results  Component Value Date   WBC 8.8 11/04/2018   HGB 8.8 (L) 11/04/2018   HCT 28.7 (L) 11/04/2018   MCV 76 (L) 11/04/2018   PLT 208 11/04/2018    Allergies  Allergen Reactions  . Lipitor [Atorvastatin] Other (See Comments)    Leg pains  . Tramadol Other (See Comments)    Mouth feels like it's on fire  . Lactose Intolerance (Gi) Nausea Only     Current Outpatient Medications:  .  ALPRAZolam (XANAX) 1 MG tablet, TAKE 1 TABLET BY MOUTH ONCE OR TWICE A DAY FOR ANXIETY, Disp: 45 tablet, Rfl: 3 .  amLODipine (NORVASC) 5 MG tablet, Take by mouth., Disp: , Rfl:  .  Blood Glucose Monitoring Suppl (ONE TOUCH ULTRA 2) w/Device KIT, Use to check blood sugar once a day. Dx. E11.9, Disp: 1 each, Rfl: 0 .  buPROPion (WELLBUTRIN XL) 150 MG 24 hr tablet, TAKE 1 TABLET BY  MOUTH EVERY DAY (Patient taking differently: Take 150 mg by mouth daily. ), Disp: 90 tablet, Rfl: 4 .  clopidogrel (PLAVIX) 75 MG tablet, Take 1 tablet (75 mg total) by mouth daily., Disp: 90 tablet, Rfl: 3 .  diphenoxylate-atropine (LOMOTIL) 2.5-0.025 MG tablet, Take one capsule 1-4 times daily as needed for diarrhea, Disp: 30 tablet, Rfl: 3 .  docusate sodium (COLACE) 100 MG capsule, Take 1 capsule (100 mg total) by mouth 2 (two) times daily., Disp: 10 capsule, Rfl: 0 .  ferrous sulfate 325 (65 FE) MG EC tablet, Take 1 tablet (325 mg total) by mouth 2 (two) times daily., Disp: 60 tablet, Rfl: 3  (ONLY TAKING ONE TABLET DAILY) .  gabapentin (NEURONTIN) 300 MG capsule, Take 1 capsule (300 mg total) by mouth 2 (two) times daily., Disp: 180 capsule, Rfl: 4 .  Galcanezumab-gnlm (EMGALITY) 120 MG/ML SOAJ, INJECT 240 MG SUBCUTANEOUSLY AS DIRECTED FOR THE FIRST MONTH., Disp: , Rfl:  .  ibuprofen (ADVIL) 800 MG tablet, Take 1 tablet (800 mg total) by mouth 2 (two) times daily as needed., Disp: 30 tablet, Rfl: 2 .  isosorbide mononitrate (IMDUR) 30 MG 24 hr tablet, Take 1 tablet (30 mg total) by mouth daily., Disp: 30 tablet, Rfl: 2 .  levothyroxine (SYNTHROID, LEVOTHROID) 25 MCG tablet, Take 1  tablet (25 mcg total) by mouth daily before breakfast., Disp: 30 tablet, Rfl: 12 .  losartan (COZAAR) 25 MG tablet, Take 25 mg by mouth daily., Disp: , Rfl:  .  metFORMIN (GLUCOPHAGE-XR) 500 MG 24 hr tablet, TAKE 1 TABLET BY MOUTH ONCE DAILY WITH DINNER, Disp: , Rfl:  .  metoprolol succinate (TOPROL XL) 25 MG 24 hr tablet, Take 0.5 tablets (12.5 mg total) by mouth daily., Disp: , Rfl:  .  nitroGLYCERIN (NITROSTAT) 0.4 MG SL tablet, Place 1 tablet (0.4 mg total) under the tongue every 5 (five) minutes as needed for chest pain., Disp: 25 tablet, Rfl: 2 .  oxyCODONE-acetaminophen (PERCOCET) 5-325 MG tablet, Take 1-2 tablets by mouth every 8 (eight) hours as needed for severe pain., Disp: 30 tablet, Rfl: 0 .  pantoprazole  (PROTONIX) 40 MG tablet, Take 1 tablet (40 mg total) by mouth 2 (two) times daily., Disp: 60 tablet, Rfl: 5 .  polyethylene glycol (MIRALAX / GLYCOLAX) 17 g packet, Take 17 g by mouth daily as needed for mild constipation., Disp: 14 each, Rfl: 0 .  promethazine (PHENERGAN) 25 MG tablet, Take 1 tablet (25 mg total) by mouth every 6 (six) hours as needed for nausea or vomiting., Disp: 30 tablet, Rfl: 5 .  rosuvastatin (CRESTOR) 40 MG tablet, Take 1 tablet (40 mg total) by mouth daily., Disp: 90 tablet, Rfl: 4 .  senna (SENOKOT) 8.6 MG TABS tablet, Take 2 tablets (17.2 mg total) by mouth at bedtime., Disp: 120 tablet, Rfl: 0 .  senna-docusate (SENOKOT-S) 8.6-50 MG tablet, Take 2 tablets by mouth daily as needed for mild constipation., Disp: 30 tablet, Rfl: 1 .  tiZANidine (ZANAFLEX) 4 MG tablet, Take 4 mg by mouth daily as needed for pain., Disp: , Rfl:  .  venlafaxine XR (EFFEXOR-XR) 75 MG 24 hr capsule, Take 1 capsule (75 mg total) by mouth daily with breakfast., Disp: 30 capsule, Rfl: 5  Review of Systems  Constitutional: Negative.   Respiratory: Negative.   Cardiovascular: Negative.   Musculoskeletal: Negative.     Social History   Tobacco Use  . Smoking status: Former Smoker    Types: Cigarettes    Quit date: 08/31/1994    Years since quitting: 24.2  . Smokeless tobacco: Never Used  . Tobacco comment: quit 28 years ago  Substance Use Topics  . Alcohol use: No    Alcohol/week: 0.0 standard drinks      Objective:   BP (!) 158/92 (BP Location: Right Arm, Patient Position: Sitting, Cuff Size: Normal)   Pulse 85   Temp (!) 97.5 F (36.4 C) (Temporal)   Wt 156 lb 3.2 oz (70.9 kg)   SpO2 97%   BMI 28.57 kg/m  Vitals:   12/09/18 1102 12/09/18 1128  BP: (!) 152/82 (!) 158/92  Pulse: 85   Temp: (!) 97.5 F (36.4 C)   TempSrc: Temporal   SpO2: 97%   Weight: 156 lb 3.2 oz (70.9 kg)   Body mass index is 28.57 kg/m.   Physical Exam  General appearance: Well developed, well  nourished female, cooperative and in no acute distress Head: Normocephalic, without obvious abnormality, atraumatic Respiratory: Respirations even and unlabored, normal respiratory rate Extremities: All extremities are intact.  Skin: Skin color, texture, turgor normal. No rashes seen  Psych: Appropriate mood and affect. Neurologic: Mental status: Alert, oriented to person, place, and time, thought content appropriate.   PHQ9 SCORE ONLY 12/09/2018 10/20/2017 05/18/2016  Score 25 14 16  Assessment & Plan    1. Iron deficiency anemia, unspecified iron deficiency anemia type Will contact Dr. Elmon Else office to see if he checked CBC with labs last week. Currently on QD iron sulfate.   2. Anxiety Worse lately, refill- ALPRAZolam (XANAX) 1 MG tablet; TAKE 1 TABLET BY MOUTH ONCE OR TWICE A DAY FOR ANXIETY  Dispense: 45 tablet; Refill: 3  3. Hypertension associated with stage 3 chronic kidney disease due to type 2 diabetes mellitus (HCC) Uncontrolled, but just recently added amlodipine by Dr. Holley Raring. Continue current medications.    4. Depression, major, single episode, moderate (HCC) Worse since mobility limited by hip fracture and isoloation from Covid, but she does feel venlafaxine and bupropion are helping.         [name], MD  [facility]

## 2018-12-09 NOTE — Patient Instructions (Signed)
.   Please review the attached list of medications and notify my office if there are any errors.   . Please bring all of your medications to every appointment so we can make sure that our medication list is the same as yours.   . It is especially important to get the annual flu vaccine this year. If you haven't had it already, please go to your pharmacy or call the office as soon as possible to schedule you flu shot.  

## 2018-12-12 ENCOUNTER — Telehealth: Payer: Self-pay | Admitting: Family Medicine

## 2018-12-12 NOTE — Telephone Encounter (Signed)
[facility] w/  [place] [phone]   Needing status of order for pt that was faxed on Nov 25, 2018.  Thanks, [employer]

## 2018-12-12 NOTE — Telephone Encounter (Addendum)
Home health orders reprinted, signed and sent to medical records to be faxed.

## 2018-12-13 ENCOUNTER — Other Ambulatory Visit: Payer: Self-pay

## 2018-12-13 ENCOUNTER — Ambulatory Visit (INDEPENDENT_AMBULATORY_CARE_PROVIDER_SITE_OTHER): Payer: Medicare Other | Admitting: Physician Assistant

## 2018-12-13 ENCOUNTER — Ambulatory Visit (INDEPENDENT_AMBULATORY_CARE_PROVIDER_SITE_OTHER): Payer: Medicare Other

## 2018-12-13 DIAGNOSIS — M25552 Pain in left hip: Secondary | ICD-10-CM | POA: Diagnosis not present

## 2018-12-13 NOTE — Progress Notes (Signed)
Post-Op Visit Note   Patient: [name]           Date of Birth: [birth year]           MRN: [id number] Visit Date: 12/13/2018 PCP: [name], MD   Assessment & Plan:  Chief Complaint:  Chief Complaint  Patient presents with  . Left Hip - Pain   Visit Diagnoses:  1. Pain in left hip     Plan: Patient is a pleasant 48 year old female who presents to our clinic today 2 months status post left hip intramedullary nail, date of surgery 10/13/2018.  She is starting to progress when she fell last Monday causing increased pain to her left hip.  She has had increased pain to the lateral hip which has been worse with weightbearing.  She had progressed to ambulating without a walker but is now back using her walker.  Examination of the left hip reveals moderate tenderness to the lateral aspect.  Increased pain with hip flexion and internal rotation.  She is neurovascularly intact distally.  X-rays demonstrate stable fixation of the fracture without any evidence of acute fracture.  We will have the patient continue with activity as tolerated.  She will follow-up with [place] in 4 weeks time for repeat evaluation and 2 view x-rays of the left femur.  Call with concerns or questions in the meantime.  Follow-Up Instructions: Return in about 4 weeks (around 01/10/2019).   Orders:  Orders Placed This Encounter  Procedures  . XR HIP UNILAT W OR W/O PELVIS 1V LEFT   No orders of the defined types were placed in this encounter.   Imaging: Xr Hip Unilat W Or W/o Pelvis 1v Left  Result Date: 12/13/2018 X-rays demonstrate stable fixation of the fracture without acute findings.   PMFS History: Patient Active Problem List   Diagnosis Date Noted  . Depression, major, single episode, moderate ([place]) 12/09/2018  . Fall   . Closed nondisplaced intertrochanteric fracture of left femur ([place]) 10/12/2018  . Femur fracture, left ([place]) 10/12/2018  . Hypotension 06/26/2018  . Autonomic  neuropathy 03/24/2018  . Acute delirium 03/03/2018  . H/O gastric bypass 03/02/2018  . Hypertension associated with stage 3 chronic kidney disease due to type 2 diabetes mellitus ([place]) 02/20/2018  . SI (sacroiliac) joint dysfunction 12/02/2017  . MI, acute, non ST segment elevation ([place])   . Effort angina ([place]) 06/29/2017  . Unstable angina ([place]) 06/24/2017  . Syncope 04/09/2017  . Insomnia 03/18/2017  . Ischemic cardiomyopathy   . Arthritis   . Anxiety   . Tendinitis of upper biceps tendon of right shoulder 03/16/2017  . Degenerative tear of glenoid labrum of right shoulder 03/15/2017  . Acute colitis 01/27/2017  . Hx of colonic polyps   . H/O medication noncompliance 12/14/2015  . Emesis   . Atherosclerosis of native coronary artery of native heart with stable angina pectoris ([place])   . Hypertensive heart disease   . CKD (chronic kidney disease), stage III ([place]) 06/05/2015  . Status post bariatric surgery 06/04/2015  . Colitis 06/03/2015  . Carotid stenosis 04/30/2015  . Type 2 diabetes mellitus with stage 3 chronic kidney disease, without long-term current use of insulin ([place])   . Stable angina pectoris ([place]) 04/17/2015  . Iron deficiency anemia 03/22/2015  . Vitamin B12 deficiency 02/18/2015  . Misuse of medications for pain 02/18/2015  . Major depressive disorder, recurrent, severe with psychotic features ([place]) 02/15/2015  . Helicobacter pylori infection 11/23/2014  .  Hemiparesis, left ([place]) 11/23/2014  . Benign neoplasm of colon 11/20/2014  . Malignant melanoma ([place]) 08/25/2014  . Chronic systolic CHF (congestive heart failure) ([place])   . Incomplete bladder emptying 07/12/2014  . Hypothyroidism 12/30/2013  . Aberrant subclavian artery 11/17/2013  . Multiple sclerosis ([place]) 11/02/2013  . History of CVA with residual deficit 06/20/2013  . Headache, migraine 05/29/2013  . Hyperlipidemia   . GERD (gastroesophageal reflux disease)   . Neuropathy ([place]) 01/02/2011  . Stroke  ([place]) 06/21/2008  . Depression with anxiety 05/01/2008  . Essential hypertension 05/01/2008   Past Medical History:  Diagnosis Date  . Acute pyelonephritis   . Anemia    iron deficiency anemia  . Aortic arch aneurysm ([place])   . Bipolar disorder ([place])   . BRCA negative 2014  . CAD (coronary artery disease)    a. 08/2003 Cath: LAD 30-40-med Rx; b. 11/2014 PCI: LAD 73m(3.25x23 Xience Alpine DES); c. 06/2015 PCI: D1 (2.25x12 Resolute Integrity DES); d. 06/2017 PCI: Patent mLAD stent, D2 95 (PTCA); e. 09/2017 PCI: D2 99ost (CBA); d. 12/2017 Cath: LM nl, LAD 386m80d (small), D1 40ost, D2 95ost, LCX 40p, RCA 40ost/p->Med rx for D2 given restenosis.  . CKD (chronic kidney disease), stage III   . Colon polyp   . CVA (cerebral vascular accident) ([place]   Left side weakness.   . Diabetes ([place] [place]  . Family history of breast cancer    BRCA neg 2014  . Gastric ulcer 04/27/2011  . History of echocardiogram    a. 03/2017 Echo: EF 60-65%, no rwma; b. 02/2018 Echo: EF 60-65%, no rwma. Nl RV fxn. No cardiac source of emboli (admitted w/ stroke).  . [place] (hypertension)   . Hyperlipemia   . Hypothyroid   . Malignant melanoma of skin of scalp ([place]  . MI, acute, non ST segment elevation ([place]  . Neuromuscular disorder ([place]  . Orthostatic hypotension   . S/P drug eluting coronary stent placement 06/04/2015  . Sepsis (HCL[place] Resort2/14/2019  . Stroke ([facility]   a. 02/2018 MRI: 84m59mate acute/early subacute L medial frontal lobe inarct; b. 02/2018 MRA No large vessel occlusion or aneurysm. Mod to sev L P2 stenosis. thready L vertebral artery, diffusely dzs'd; c. 02/2018 Carotid U/S: <50% bilat ICA dzs.    Family History  Problem Relation Age of Onset  . Hypertension Mother   . Anxiety disorder Mother   . Depression Mother   . Bipolar disorder Mother   . Heart disease Mother        No details  . Hyperlipidemia Mother   . Kidney disease Father   . Heart disease Father 40 49 Hypertension Father   . Diabetes  Father   . Stroke Father   . Colon cancer Father        dx in his 40'53's Anxiety disorder Father   . Depression Father   . Skin cancer Father   . Kidney disease Sister   . Thyroid nodules Sister   . Hypertension Sister   . Hypertension Sister   . Diabetes Sister   . Hyperlipidemia Sister   . Depression Sister   . Breast cancer Maternal Aunt 50 63 Breast cancer Maternal Aunt 50 70 Ovarian cancer Cousin   . Colon cancer Cousin   . Breast cancer Other   . Kidney cancer Neg Hx   . Bladder Cancer Neg Hx     Past Surgical History:  Procedure Laterality  Date  . APPENDECTOMY    . CARDIAC CATHETERIZATION N/A 11/09/2014   Procedure: Coronary Angiography;  Surgeon: Minna Merritts, MD;  Location: [facility];  Service: Cardiovascular;  Laterality: N/A;  . CARDIAC CATHETERIZATION N/A 11/12/2014   Procedure: Coronary Stent Intervention;  Surgeon: Isaias Cowman, MD;  Location: [place] [facility];  Service: Cardiovascular;  Laterality: N/A;  . CARDIAC CATHETERIZATION N/A 04/18/2015   Procedure: Left Heart Cath and Coronary Angiography;  Surgeon: Minna Merritts, MD;  Location: [place] [facility];  Service: Cardiovascular;  Laterality: N/A;  . CARDIAC CATHETERIZATION Left 06/04/2015   Procedure: Left Heart Cath and Coronary Angiography;  Surgeon: Wellington Hampshire, MD;  Location: [facility];  Service: Cardiovascular;  Laterality: Left;  . CARDIAC CATHETERIZATION N/A 06/04/2015   Procedure: Coronary Stent Intervention;  Surgeon: Wellington Hampshire, MD;  Location: [place] [facility];  Service: Cardiovascular;  Laterality: N/A;  . CESAREAN SECTION  2001  . CHOLECYSTECTOMY N/A 11/18/2016   Procedure: LAPAROSCOPIC CHOLECYSTECTOMY WITH INTRAOPERATIVE CHOLANGIOGRAM;  Surgeon: Christene Lye, MD;  Location: [facility];  Service: General;  Laterality: N/A;  . COLONOSCOPY WITH PROPOFOL N/A 04/27/2016   Procedure: COLONOSCOPY WITH PROPOFOL;  Surgeon: [name], MD;   Location: [facility];  Service: Endoscopy;  Laterality: N/A;  . COLONOSCOPY WITH PROPOFOL N/A 01/12/2018   Procedure: COLONOSCOPY WITH PROPOFOL;  Surgeon: Toledo, Benay Pike, MD;  Location: [facility];  Service: Endoscopy;  Laterality: N/A;  . CORONARY ANGIOPLASTY    . CORONARY BALLOON ANGIOPLASTY N/A 06/29/2017   Procedure: CORONARY BALLOON ANGIOPLASTY;  Surgeon: Wellington Hampshire, MD;  Location: [place] [facility];  Service: Cardiovascular;  Laterality: N/A;  . CORONARY BALLOON ANGIOPLASTY N/A 09/20/2017   Procedure: CORONARY BALLOON ANGIOPLASTY;  Surgeon: Wellington Hampshire, MD;  Location: [place];  Service: Cardiovascular;  Laterality: N/A;  . DILATION AND CURETTAGE OF UTERUS    . ESOPHAGOGASTRODUODENOSCOPY (EGD) WITH PROPOFOL N/A 09/14/2014   Procedure: ESOPHAGOGASTRODUODENOSCOPY (EGD) WITH PROPOFOL;  Surgeon: Josefine Class, MD;  [facility];  Service: Endoscopy;  Laterality: N/A;  . ESOPHAGOGASTRODUODENOSCOPY (EGD) WITH PROPOFOL N/A 04/27/2016   Procedure: ESOPHAGOGASTRODUODENOSCOPY (EGD) WITH PROPOFOL;  Surgeon: [name], MD;  Location: [facility];  Service: Endoscopy;  Laterality: N/A;  Diabetic - oral meds  . ESOPHAGOGASTRODUODENOSCOPY (EGD) WITH PROPOFOL N/A 01/12/2018   Procedure: ESOPHAGOGASTRODUODENOSCOPY (EGD) WITH PROPOFOL;  Surgeon: Toledo, Benay Pike, MD;  Location: [facility];  Service: Endoscopy;  Laterality: N/A;  . GASTRIC BYPASS  09/2009   [place] (IM) NAIL INTERTROCHANTERIC Left 10/13/2018   Procedure: INTRAMEDULLARY (IM) NAIL INTERTROCHANTRIC;  Surgeon: Leandrew Koyanagi, MD;  Location: [facility];  Service: Orthopedics;  Laterality: Left;  . Left Carotid to sublcavian artery bypass w/ subclavian artery ligation     a. Performed @ [facility].  . LEFT HEART CATH AND CORONARY ANGIOGRAPHY Left 06/29/2017   Procedure: LEFT HEART CATH AND CORONARY ANGIOGRAPHY;  Surgeon: Wellington Hampshire, MD;   Location: [place] [facility] [facility];  Service: Cardiovascular;  Laterality: Left;  . LEFT HEART CATH AND CORONARY ANGIOGRAPHY N/A 09/20/2017   Procedure: LEFT HEART CATH AND CORONARY ANGIOGRAPHY;  Surgeon: Wellington Hampshire, MD;  Location: [place] [facility];  Service: Cardiovascular;  Laterality: N/A;  . LEFT HEART CATH AND CORONARY ANGIOGRAPHY N/A 12/20/2017   Procedure: LEFT HEART CATH AND CORONARY ANGIOGRAPHY;  Surgeon: Wellington Hampshire, MD;  Location: [place];  Service: Cardiovascular;  Laterality: N/A;  . MELANOMA EXCISION  2016   Dr. Evorn Gong  . [place]  2002  . RIGHT OOPHORECTOMY    . SHOULDER ARTHROSCOPY WITH OPEN ROTATOR CUFF REPAIR Right 01/07/2016   Procedure: SHOULDER ARTHROSCOPY WITH DEBRIDMENT, SUBACHROMIAL DECOMPRESSION;  Surgeon: Corky Mull, MD;  Location: [facility];  Service: Orthopedics;  Laterality: Right;  . SHOULDER ARTHROSCOPY WITH OPEN ROTATOR CUFF REPAIR Right 03/16/2017   Procedure: SHOULDER ARTHROSCOPY WITH OPEN ROTATOR CUFF REPAIR POSSIBLE BICEPS TENODESIS;  Surgeon: Corky Mull, MD;  Location: [facility];  Service: Orthopedics;  Laterality: Right;  . TRIGGER FINGER RELEASE Right     [place]   Social History   Occupational History  . 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 Disabled through 08/02/2013 per forms 02/03/2013  Tobacco Use  . Smoking status: Former Smoker    Types: Cigarettes    Quit date: 08/31/1994    Years since quitting: 24.3  . Smokeless tobacco: Never Used  . Tobacco comment: quit 28 years ago  Substance and Sexual Activity  . Alcohol use: No    Alcohol/week: 0.0 standard drinks  . Drug use: No  . Sexual activity: Not Currently    Birth control/protection: None

## 2018-12-15 DIAGNOSIS — I2510 Atherosclerotic heart disease of native coronary artery without angina pectoris: Secondary | ICD-10-CM | POA: Diagnosis not present

## 2018-12-15 DIAGNOSIS — G35 Multiple sclerosis: Secondary | ICD-10-CM | POA: Diagnosis not present

## 2018-12-15 DIAGNOSIS — I5022 Chronic systolic (congestive) heart failure: Secondary | ICD-10-CM | POA: Diagnosis not present

## 2018-12-15 DIAGNOSIS — E1122 Type 2 diabetes mellitus with diabetic chronic kidney disease: Secondary | ICD-10-CM | POA: Diagnosis not present

## 2018-12-15 DIAGNOSIS — S72142D Displaced intertrochanteric fracture of left femur, subsequent encounter for closed fracture with routine healing: Secondary | ICD-10-CM | POA: Diagnosis not present

## 2018-12-15 DIAGNOSIS — I130 Hypertensive heart and chronic kidney disease with heart failure and stage 1 through stage 4 chronic kidney disease, or unspecified chronic kidney disease: Secondary | ICD-10-CM | POA: Diagnosis not present

## 2018-12-19 ENCOUNTER — Telehealth: Payer: Self-pay | Admitting: Family Medicine

## 2018-12-19 DIAGNOSIS — E1122 Type 2 diabetes mellitus with diabetic chronic kidney disease: Secondary | ICD-10-CM | POA: Diagnosis not present

## 2018-12-19 DIAGNOSIS — S72142D Displaced intertrochanteric fracture of left femur, subsequent encounter for closed fracture with routine healing: Secondary | ICD-10-CM | POA: Diagnosis not present

## 2018-12-19 DIAGNOSIS — I5022 Chronic systolic (congestive) heart failure: Secondary | ICD-10-CM | POA: Diagnosis not present

## 2018-12-19 DIAGNOSIS — I2510 Atherosclerotic heart disease of native coronary artery without angina pectoris: Secondary | ICD-10-CM | POA: Diagnosis not present

## 2018-12-19 DIAGNOSIS — G35 Multiple sclerosis: Secondary | ICD-10-CM | POA: Diagnosis not present

## 2018-12-19 DIAGNOSIS — I130 Hypertensive heart and chronic kidney disease with heart failure and stage 1 through stage 4 chronic kidney disease, or unspecified chronic kidney disease: Secondary | ICD-10-CM | POA: Diagnosis not present

## 2018-12-19 NOTE — Telephone Encounter (Signed)
That's fine

## 2018-12-19 NOTE — Telephone Encounter (Signed)
Home Health Verbal Orders - Caller/Agency:  Mickel Baas from [facility]  Callback Number: [phone] Requesting OT/PT/Skilled Nursing/Social Work/Speech Therapy: wanted to let Dr know that they discharged with goals met today  Frequency:

## 2018-12-19 NOTE — Telephone Encounter (Signed)
From [facility], please review

## 2018-12-20 ENCOUNTER — Telehealth: Payer: Self-pay

## 2018-12-20 ENCOUNTER — Ambulatory Visit: Payer: Self-pay

## 2018-12-20 ENCOUNTER — Ambulatory Visit: Payer: Medicare Other | Admitting: Cardiovascular Disease

## 2018-12-20 NOTE — Telephone Encounter (Signed)
Mickel Baas with [facility] advised

## 2018-12-20 NOTE — Chronic Care Management (AMB) (Signed)
  Chronic Care Management   Outreach Note  12/20/2018 Name: [name] MRN: [id number] DOB: 14-Feb-1970  Primary Care Provider: [name], MD Referred by : Health Plan   A second unsuccessful telephone outreach was attempted today. [name] was referred to the case management team for assistance with care management and care coordination. Received a voice prompt indicating that the voice mailbox was full and unable to accept new messages.   Follow Up Plan:  The care management team will attempt to reach her again within then next two to three weeks.   [name] [facility] Care Management [phone]

## 2018-12-25 NOTE — Progress Notes (Signed)
Date:  12/27/2018   ID:  [address] [place] 05-Aug-1970, MRN [id number]  Patient Location:  [address] [place] [address]   Provider location:   [facility], [address] office  PCP:  [name], MD  Cardiologist:  Arvid Right [facility]  Chief Complaint  Patient presents with   office visit    3 mo F/U-Patient reports intermittent chest pain x 2 months and elevated blood pressure readings. Meds verbally reviewed with patient.    History of Present Illness:    [name] is a 48 y.o. female past medical history of smoking,  strokes, 2010 chest pain dating back to 2005  CAD, Previous LAD stent  normal LV function,   s/p gastric bypass surgery,  Baseline creatinine 1.5, previously taken off her diuretic Catheterization May 2017 with stent placed to her first diagonal, there was residual 50% distal LAD disease, 30% proximal, 40% proximal circumflex Labile pressures Who presents for routine followup of her coronary artery disease   Father died several months ago Broke hip, lost balance, fell back Released freom PT Still with balance issue, off walker, on cane  Told to use walker outside house No regular exercise  Having headaches Feels like having palpitations Tremor Not on iron pill, follow by hem/onc  CR 1.89, GFR 31 HCT 8.8  EKG personally reviewed by myself on todays visit  Shows NSR rate 78  Nonspecific St and T wave ABN   Prior CV studies:   The following studies were reviewed today:  Previous cardiac catheterization results discussed with her in detail Cardiac catheterization September 20, 2017 Significant one-vessel coronary artery disease with patent stents in the mid LAD and first diagonal.   significant chronic disease in the distal LAD close to the apex. Severe restenosis  ostial second diagonal at the site of previous angioplasty in May 2019. Successful cutting balloon angioplasty of the ostial second  diagonal without stent placement.    Past Medical History:  Diagnosis Date   Acute pyelonephritis    Anemia    iron deficiency anemia   Aortic arch aneurysm ([place])    Bipolar disorder ([place])    BRCA negative 2014   CAD (coronary artery disease)    a. 08/2003 Cath: LAD 30-40-med Rx; b. 11/2014 PCI: LAD 36m(3.25x23 Xience Alpine DES); c. 06/2015 PCI: D1 (2.25x12 Resolute Integrity DES); d. 06/2017 PCI: Patent mLAD stent, D2 95 (PTCA); e. 09/2017 PCI: D2 99ost (CBA); d. 12/2017 Cath: LM nl, LAD 333m80d (small), D1 40ost, D2 95ost, LCX 40p, RCA 40ost/p->Med rx for D2 given restenosis.   CKD (chronic kidney disease), stage III    Colon polyp    CVA (cerebral vascular accident) ([place]   Left side weakness.    Diabetes ([place]   Family history of breast cancer    BRCA neg 2014   Gastric ulcer 04/27/2011   History of echocardiogram    a. 03/2017 Echo: EF 60-65%, no rwma; b. 02/2018 Echo: EF 60-65%, no rwma. Nl RV fxn. No cardiac source of emboli (admitted w/ stroke).   HTN (hypertension)    Hyperlipemia    Hypothyroid    Malignant melanoma of skin of scalp (HCC)    MI, acute, non ST segment elevation (HCC)    Neuromuscular disorder (HCC)    Orthostatic hypotension    S/P drug eluting coronary stent placement 06/04/2015   Sepsis ([place]2/14/2019   Stroke ([place]   a. 02/2018  MRI: 95m late acute/early subacute L medial frontal lobe inarct; b. 02/2018 MRA No large vessel occlusion or aneurysm. Mod to sev L P2 stenosis. thready L vertebral artery, diffusely dzs'd; c. 02/2018 Carotid U/S: <50% bilat ICA dzs.   Past Surgical History:  Procedure Laterality Date   APPENDECTOMY     CARDIAC CATHETERIZATION N/A 11/09/2014   Procedure: Coronary Angiography;  Surgeon: TMinna Merritts MD;  [place] [facility];  Service: Cardiovascular;  Laterality: N/A;   CARDIAC CATHETERIZATION N/A 11/12/2014   Procedure: Coronary Stent Intervention;  Surgeon: AIsaias Cowman MD;   [place] [facility];  Service: Cardiovascular;  Laterality: N/A;   CARDIAC CATHETERIZATION N/A 04/18/2015   Procedure: Left Heart Cath and Coronary Angiography;  Surgeon: TMinna Merritts MD;  [place];  Service: Cardiovascular;  Laterality: N/A;   CARDIAC CATHETERIZATION Left 06/04/2015   Procedure: Left Heart Cath and Coronary Angiography;  Surgeon: MWellington Hampshire MD;  [place] [facility];  Service: Cardiovascular;  Laterality: Left;   CARDIAC CATHETERIZATION N/A 06/04/2015   Procedure: Coronary Stent Intervention;  Surgeon: MWellington Hampshire MD;  [place] [facility] [place];  Service: Cardiovascular;  Laterality: N/A;   CESAREAN SECTION  2001   CHOLECYSTECTOMY N/A 11/18/2016   Procedure: LAPAROSCOPIC CHOLECYSTECTOMY WITH INTRAOPERATIVE CHOLANGIOGRAM;  Surgeon: SChristene Lye MD;  [facility];  Service: General;  Laterality: N/A;   COLONOSCOPY WITH PROPOFOL N/A 04/27/2016   Procedure: COLONOSCOPY WITH PROPOFOL;  Surgeon: DLucilla Lame MD;  [place] [facility]  Service: Endoscopy;  Laterality: N/A;   COLONOSCOPY WITH PROPOFOL N/A 01/12/2018   Procedure: COLONOSCOPY WITH PROPOFOL;  Surgeon: Toledo, TBenay Pike MD;  Location: [facility];  Service: Endoscopy;  Laterality: N/A;   CORONARY ANGIOPLASTY     CORONARY BALLOON ANGIOPLASTY N/A 06/29/2017   Procedure: CORONARY BALLOON ANGIOPLASTY;  Surgeon: AWellington Hampshire MD;  [facility];  Service: Cardiovascular;  Laterality: N/A;   CORONARY BALLOON ANGIOPLASTY N/A 09/20/2017   Procedure: CORONARY BALLOON ANGIOPLASTY;  Surgeon: AWellington Hampshire MD;  [facility] [facility];  Service: Cardiovascular;  Laterality: N/A;   DILATION AND CURETTAGE OF UTERUS     ESOPHAGOGASTRODUODENOSCOPY (EGD) WITH PROPOFOL N/A 09/14/2014   Procedure: ESOPHAGOGASTRODUODENOSCOPY (EGD) WITH PROPOFOL;  Surgeon: MJosefine Class MD;  [facility];   Service: Endoscopy;  Laterality: N/A;   ESOPHAGOGASTRODUODENOSCOPY (EGD) WITH PROPOFOL N/A 04/27/2016   Procedure: ESOPHAGOGASTRODUODENOSCOPY (EGD) WITH PROPOFOL;  Surgeon: DLucilla Lame MD;  [place]  Service: Endoscopy;  Laterality: N/A;  Diabetic - oral meds   ESOPHAGOGASTRODUODENOSCOPY (EGD) WITH PROPOFOL N/A 01/12/2018   Procedure: ESOPHAGOGASTRODUODENOSCOPY (EGD) WITH PROPOFOL;  Surgeon: Toledo, TBenay Pike MD;  Location: [facility];  Service: Endoscopy;  Laterality: N/A;   [facility]  [facility]   INTRAMEDULLARY (IM) NAIL INTERTROCHANTERIC Left 10/13/2018   Procedure: INTRAMEDULLARY (IM) NAIL INTERTROCHANTRIC;  Surgeon: XLeandrew Koyanagi MD;  [place]  Service: Orthopedics;  Laterality: Left;   Left Carotid to sublcavian artery bypass w/ subclavian artery ligation     a. Performed @ [facility].   LEFT HEART CATH AND CORONARY ANGIOGRAPHY Left 06/29/2017   Procedure: LEFT HEART CATH AND CORONARY ANGIOGRAPHY;  Surgeon: AWellington Hampshire MD;  [facility] [place];  Service: Cardiovascular;  Laterality: Left;   LEFT HEART CATH AND CORONARY ANGIOGRAPHY N/A 09/20/2017   Procedure: LEFT HEART CATH AND CORONARY ANGIOGRAPHY;  Surgeon: AFletcher Anon  Mertie Clause, MD;  Location: [place] [facility];  Service: Cardiovascular;  Laterality: N/A;   LEFT HEART CATH AND CORONARY ANGIOGRAPHY N/A 12/20/2017   Procedure: LEFT HEART CATH AND CORONARY ANGIOGRAPHY;  Surgeon: Wellington Hampshire, MD;  Location: [place] [facility];  Service: Cardiovascular;  Laterality: N/A;   MELANOMA EXCISION  2016   Dr. Henreitta Cea ABLATION  2002   RIGHT OOPHORECTOMY     SHOULDER ARTHROSCOPY WITH OPEN ROTATOR CUFF REPAIR Right 01/07/2016   Procedure: SHOULDER ARTHROSCOPY WITH DEBRIDMENT, SUBACHROMIAL DECOMPRESSION;  Surgeon: Corky Mull, MD;  Location: [facility];  Service: Orthopedics;  Laterality: Right;   SHOULDER ARTHROSCOPY WITH OPEN ROTATOR CUFF  REPAIR Right 03/16/2017   Procedure: SHOULDER ARTHROSCOPY WITH OPEN ROTATOR CUFF REPAIR POSSIBLE BICEPS TENODESIS;  Surgeon: Corky Mull, MD;  Location: [facility];  Service: Orthopedics;  Laterality: Right;   TRIGGER FINGER RELEASE Right     Middle Finger      Allergies:   Lipitor [atorvastatin], Tramadol, and Lactose intolerance (gi)   Social History   Tobacco Use   Smoking status: Former Smoker    Types: Cigarettes    Quit date: 08/31/1994    Years since quitting: 24.3   Smokeless tobacco: Never Used   Tobacco comment: quit 28 years ago  Substance Use Topics   Alcohol use: No    Alcohol/week: 0.0 standard drinks   Drug use: No     Current Outpatient Medications on File Prior to Visit  Medication Sig Dispense Refill   ALPRAZolam (XANAX) 1 MG tablet TAKE 1 TABLET BY MOUTH ONCE OR TWICE A DAY FOR ANXIETY 45 tablet 3   amLODipine (NORVASC) 5 MG tablet Take 5 mg by mouth daily.      Blood Glucose Monitoring Suppl (ONE TOUCH ULTRA 2) w/Device KIT Use to check blood sugar once a day. Dx. E11.9 1 each 0   buPROPion (WELLBUTRIN XL) 150 MG 24 hr tablet TAKE 1 TABLET BY MOUTH EVERY DAY (Patient taking differently: Take 150 mg by mouth daily. ) 90 tablet 4   clopidogrel (PLAVIX) 75 MG tablet Take 1 tablet (75 mg total) by mouth daily. 90 tablet 3   diphenoxylate-atropine (LOMOTIL) 2.5-0.025 MG tablet Take one capsule 1-4 times daily as needed for diarrhea 30 tablet 3   Galcanezumab-gnlm (EMGALITY) 120 MG/ML SOAJ INJECT 240 MG SUBCUTANEOUSLY AS DIRECTED FOR THE FIRST MONTH.     ibuprofen (ADVIL) 800 MG tablet Take 1 tablet (800 mg total) by mouth 2 (two) times daily as needed. 30 tablet 2   isosorbide mononitrate (IMDUR) 30 MG 24 hr tablet Take 1 tablet (30 mg total) by mouth daily. 30 tablet 2   levothyroxine (SYNTHROID, LEVOTHROID) 25 MCG tablet Take 1 tablet (25 mcg total) by mouth daily before breakfast. 30 tablet 12   losartan (COZAAR) 25 MG tablet Take 25 mg by mouth  daily.     metFORMIN (GLUCOPHAGE-XR) 500 MG 24 hr tablet TAKE 1 TABLET BY MOUTH ONCE DAILY WITH DINNER     nitroGLYCERIN (NITROSTAT) 0.4 MG SL tablet Place 1 tablet (0.4 mg total) under the tongue every 5 (five) minutes as needed for chest pain. 25 tablet 2   pantoprazole (PROTONIX) 40 MG tablet Take 1 tablet (40 mg total) by mouth 2 (two) times daily. 60 tablet 5   promethazine (PHENERGAN) 25 MG tablet Take 1 tablet (25 mg total) by mouth every 6 (six) hours as needed for nausea or vomiting. 30 tablet 5   rosuvastatin (CRESTOR)  40 MG tablet Take 1 tablet (40 mg total) by mouth daily. 90 tablet 4   venlafaxine XR (EFFEXOR-XR) 75 MG 24 hr capsule Take 1 capsule (75 mg total) by mouth daily with breakfast. 30 capsule 5   gabapentin (NEURONTIN) 300 MG capsule Take 1 capsule (300 mg total) by mouth 2 (two) times daily. (Patient not taking: Reported on 12/27/2018) 180 capsule 4   No current facility-administered medications on file prior to visit.      Family Hx: The patient's family history includes Anxiety disorder in her father and mother; Bipolar disorder in her mother; Breast cancer in an other family member; Breast cancer (age of onset: 38) in her maternal aunt and maternal aunt; Colon cancer in her cousin and father; Depression in her father, mother, and sister; Diabetes in her father and sister; Heart disease in her mother; Heart disease (age of onset: 81) in her father; Hyperlipidemia in her mother and sister; Hypertension in her father, mother, sister, and sister; Kidney disease in her father and sister; Ovarian cancer in her cousin; Skin cancer in her father; Stroke in her father; Thyroid nodules in her sister. There is no history of Kidney cancer or Bladder Cancer.  ROS:   Please see the history of present illness.    Review of Systems  Constitutional: Negative.   Respiratory: Negative.   Cardiovascular: Negative.   Gastrointestinal: Negative.   Musculoskeletal: Negative.         Leg weakness  Neurological: Negative.   Psychiatric/Behavioral: Negative.   All other systems reviewed and are negative.    Labs/Other Tests and Data Reviewed:    Recent Labs: 06/26/2018: TSH 4.576 06/28/2018: Magnesium 1.7 10/12/2018: BNP 52.2 11/04/2018: ALT 7; BUN 17; Creatinine, Ser 1.89; Hemoglobin 8.8; Platelets 208; Potassium 4.2; Sodium 138   Recent Lipid Panel Lab Results  Component Value Date/Time   CHOL 80 06/15/2018 04:57 AM   CHOL 159 05/28/2016 12:12 PM   CHOL 197 12/30/2013 03:55 AM   TRIG 91 06/15/2018 04:57 AM   TRIG 261 (H) 12/30/2013 03:55 AM   HDL 31 (L) 06/15/2018 04:57 AM   HDL 34 (L) 05/28/2016 12:12 PM   HDL 28 (L) 12/30/2013 03:55 AM   CHOLHDL 2.6 06/15/2018 04:57 AM   LDLCALC 31 06/15/2018 04:57 AM   LDLCALC 103 (H) 12/08/2016 12:21 PM   LDLCALC 117 (H) 12/30/2013 03:55 AM   LDLDIRECT 83.3 01/26/2011 09:13 AM    Wt Readings from Last 3 Encounters:  12/27/18 151 lb 4 oz (68.6 kg)  12/09/18 156 lb 3.2 oz (70.9 kg)  11/04/18 157 lb (71.2 kg)     Exam:    BP (!) 158/100 (BP Location: Left Arm, Patient Position: Sitting, Cuff Size: Normal)    Pulse 78    Ht '5\' 2"'  (1.575 m)    Wt 151 lb 4 oz (68.6 kg)    SpO2 98%    BMI 27.66 kg/m   Constitutional:  oriented to person, place, and time. No distress.  Slowly getting up from chair to get to the exam table, weak HENT:  Head: Grossly normal Eyes:  no discharge. No scleral icterus.  Neck: No JVD, no carotid bruits  Cardiovascular: Regular rate and rhythm, no murmurs appreciated Pulmonary/Chest: Clear to auscultation bilaterally, no wheezes or rails Abdominal: Soft.  no distension.  no tenderness.  Musculoskeletal: Normal range of motion Neurological:  normal muscle tone. Coordination normal. No atrophy Skin: Skin warm and dry Psychiatric: normal affect, pleasant   ASSESSMENT & PLAN:  Hyponatremia Recovered 138 Normal lab work recently Will minimize use of diuretics  Atherosclerosis of native  coronary artery of native heart with stable angina pectoris (HCC) Currently with no symptoms of angina. No further workup at this time. Continue current medication regimen.  Near syncope No recent episodes  BP elevated .  At her request we will increase metoprolol succinate up to 25 daily for palpitations, feeling of fast heart rate  chronic systolic CHF (congestive heart failure) (HCC) Euvolemic,  Weight down  CKD (chronic kidney disease), stage III (HCC) Stable, followed by nephrology  Essential hypertension Changes: metoprolol succinate up to 25 for palpitations Consider trial hold of imdur for headache Now on norvasc 5 mg daily May need to increase Norvasc up to 10 if she continues to hold isosorbide  History of CVA (cerebrovascular accident) Stable  Ischemic cardiomyopathy HBA1C 5.1, doing better No angina   Total encounter time more than 25 minutes  Greater than 50% was spent in counseling and coordination of care with the patient   Disposition: Follow-up in 6 months   Signed, Ida Rogue, MD  12/27/2018 1:57 PM    [place] Office [address] [place] [address], [place], [place] [address]

## 2018-12-27 ENCOUNTER — Other Ambulatory Visit: Payer: Self-pay

## 2018-12-27 ENCOUNTER — Ambulatory Visit: Payer: Medicare Other | Admitting: Internal Medicine

## 2018-12-27 ENCOUNTER — Ambulatory Visit (INDEPENDENT_AMBULATORY_CARE_PROVIDER_SITE_OTHER): Payer: Medicare Other | Admitting: Cardiovascular Disease

## 2018-12-27 ENCOUNTER — Encounter: Payer: Self-pay | Admitting: Cardiovascular Disease

## 2018-12-27 VITALS — BP 158/100 | HR 78 | Ht 62.0 in | Wt 151.2 lb

## 2018-12-27 DIAGNOSIS — N183 Chronic kidney disease, stage 3 unspecified: Secondary | ICD-10-CM | POA: Diagnosis not present

## 2018-12-27 DIAGNOSIS — N1831 Chronic kidney disease, stage 3a: Secondary | ICD-10-CM | POA: Diagnosis not present

## 2018-12-27 DIAGNOSIS — E119 Type 2 diabetes mellitus without complications: Secondary | ICD-10-CM

## 2018-12-27 DIAGNOSIS — I255 Ischemic cardiomyopathy: Secondary | ICD-10-CM

## 2018-12-27 DIAGNOSIS — E1121 Type 2 diabetes mellitus with diabetic nephropathy: Secondary | ICD-10-CM

## 2018-12-27 DIAGNOSIS — I5022 Chronic systolic (congestive) heart failure: Secondary | ICD-10-CM

## 2018-12-27 DIAGNOSIS — E785 Hyperlipidemia, unspecified: Secondary | ICD-10-CM

## 2018-12-27 DIAGNOSIS — I25118 Atherosclerotic heart disease of native coronary artery with other forms of angina pectoris: Secondary | ICD-10-CM | POA: Diagnosis not present

## 2018-12-27 MED ORDER — METOPROLOL SUCCINATE ER 25 MG PO TB24: 25.0000 mg | ORAL_TABLET | Freq: Every day | ORAL | 3 refills | Status: DC

## 2018-12-27 NOTE — Patient Instructions (Addendum)
Medication Instructions:  Please increase the metoprolol succinate  up to 25 mg daily Heart rate elevated  Ok to do a trial hold of the isosorbide for a week or two, To see if headache gets better  If you need a refill on your cardiac medications before your next appointment, please call your pharmacy.    Lab work: No new labs needed   If you have labs (blood work) drawn today and your tests are completely normal, you will receive your results only by: Marland Kitchen MyChart Message (if you have MyChart) OR . A paper copy in the mail If you have any lab test that is abnormal or we need to change your treatment, we will call you to review the results.   Testing/Procedures: No new testing needed   Follow-Up: At [facility], you and your health needs are our priority.  As part of our continuing mission to provide you with exceptional heart care, we have created designated Provider Care Teams.  These Care Teams include your primary Cardiologist (physician) and Advanced Practice Providers (APPs -  Physician Assistants and Nurse Practitioners) who all work together to provide you with the care you need, when you need it.  . You will need a follow up appointment in 6 months .  Marland Kitchen Providers on your designated Care Team:   . Murray Hodgkins, NP . Christell Faith, PA-C . Marrianne Mood, PA-C  Any Other Special Instructions Will Be Listed Below (If Applicable).  For educational health videos Log in to : www.[email or web address] Or : [email or web address], password : [email or web address]   How to Take Your Blood Pressure You can take your blood pressure at home with a machine. You may need to check your blood pressure at home:  To check if you have high blood pressure (hypertension).  To check your blood pressure over time.  To make sure your blood pressure medicine is working. Supplies needed: You will need a blood pressure machine, or monitor. You can buy one at a drugstore or online. When choosing one:  Choose one  with an arm cuff.  Choose one that wraps around your upper arm. Only one finger should fit between your arm and the cuff.  Do not choose one that measures your blood pressure from your wrist or finger. Your doctor can suggest a monitor. How to prepare Avoid these things for 30 minutes before checking your blood pressure:  Drinking caffeine.  Drinking alcohol.  Eating.  Smoking.  Exercising. Five minutes before checking your blood pressure:  Pee.  Sit in a dining chair. Avoid sitting in a soft couch or armchair.  Be quiet. Do not talk. How to take your blood pressure Follow the instructions that came with your machine. If you have a digital blood pressure monitor, these may be the instructions: 1. Sit up straight. 2. Place your feet on the floor. Do not cross your ankles or legs. 3. Rest your left arm at the level of your heart. You may rest it on a table, desk, or chair. 4. Pull up your shirt sleeve. 5. Wrap the blood pressure cuff around the upper part of your left arm. The cuff should be 1 inch (2.5 cm) above your elbow. It is best to wrap the cuff around bare skin. 6. Fit the cuff snugly around your arm. You should be able to place only one finger between the cuff and your arm. 7. Put the cord inside the groove of your elbow. 8. Press the power button. 9. Sit quietly  while the cuff fills with air and loses air. 10. Write down the numbers on the screen. 11. Wait 2-3 minutes and then repeat steps 1-10. What do the numbers mean? Two numbers make up your blood pressure. The first number is called systolic pressure. The second is called diastolic pressure. An example of a blood pressure reading is "120 over 80" (or 120/80). If you are an adult and do not have a medical condition, use this guide to find out if your blood pressure is normal: Normal  First number: below 120.  Second number: below 80. Elevated  First number: 120-129.  Second number: below 80. Hypertension  stage 1  First number: 130-139.  Second number: 80-89. Hypertension stage 2  First number: 140 or above.  Second number: 4 or above. Your blood pressure is above normal even if only the top or bottom number is above normal. Follow these instructions at home:  Check your blood pressure as often as your doctor tells you to.  Take your monitor to your next doctor's appointment. Your doctor will: ? Make sure you are using it correctly. ? Make sure it is working right.  Make sure you understand what your blood pressure numbers should be.  Tell your doctor if your medicines are causing side effects. Contact a doctor if:  Your blood pressure keeps being high. Get help right away if:  Your first blood pressure number is higher than 180.  Your second blood pressure number is higher than 120. This information is not intended to replace advice given to you by your health care provider. Make sure you discuss any questions you have with your health care provider. Document Released: 01/02/2008 Document Revised: 01/01/2017 Document Reviewed: 06/28/2015 Elsevier Patient Education  2020 [employer].

## 2018-12-29 IMAGING — US US ABDOMEN COMPLETE
1 series · 13 of 25 positions shown · non-contrast
Comparison: CT scan of October 31, 2015.

CLINICAL DATA: Nausea, vomiting.

EXAM:
ABDOMEN ULTRASOUND COMPLETE

[Series 1: us abdomen complete · 0.21mm/px · 112 acquisitions, 13 frames shown]
[im 1/112]
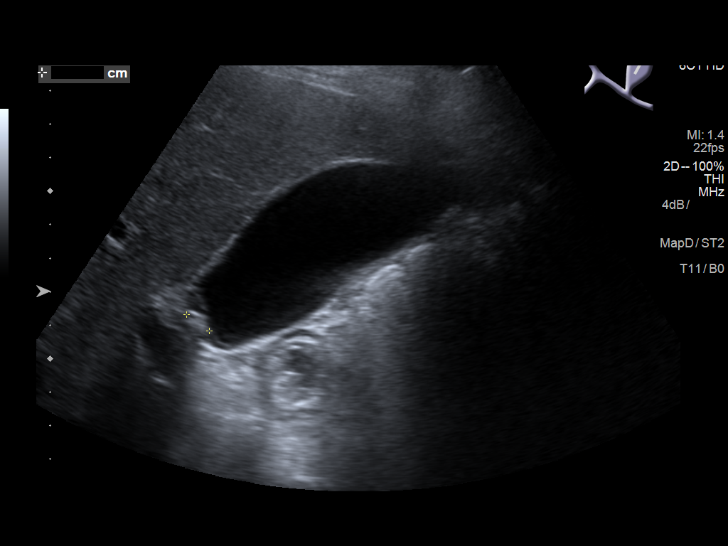
[im 10/112]
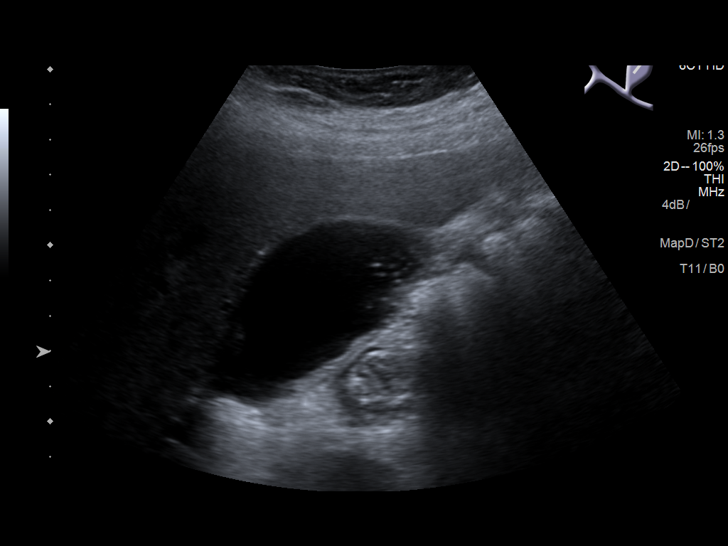
[im 19/112]
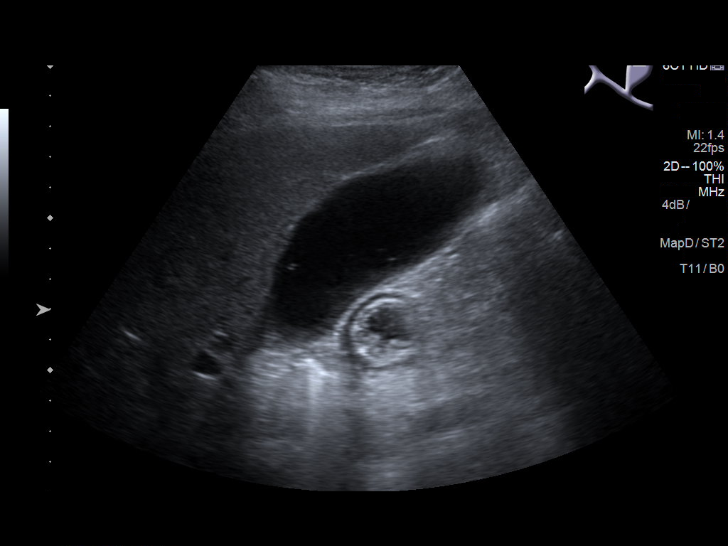
[im 28/112]
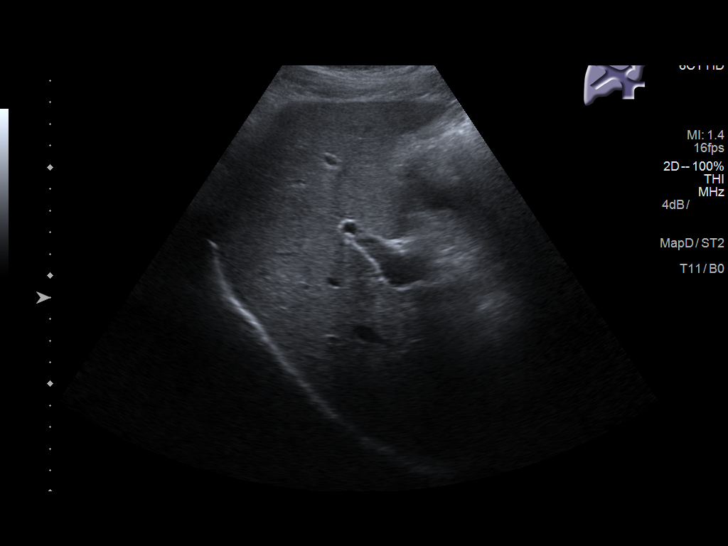
[im 38/112]
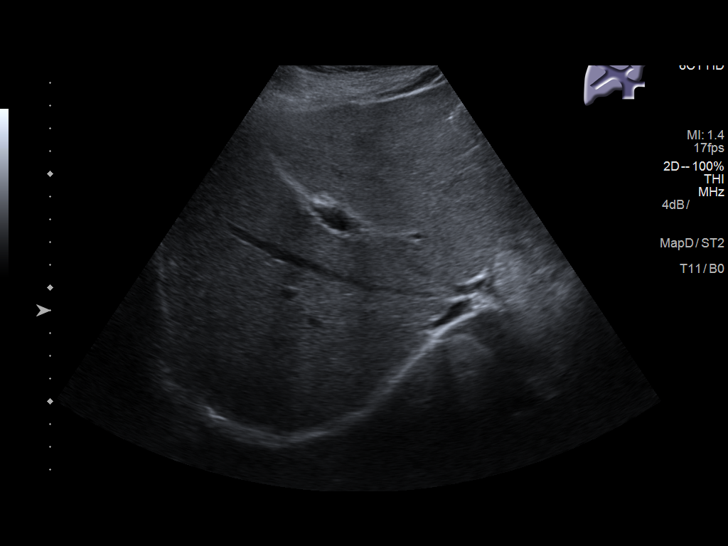
[im 47/112]
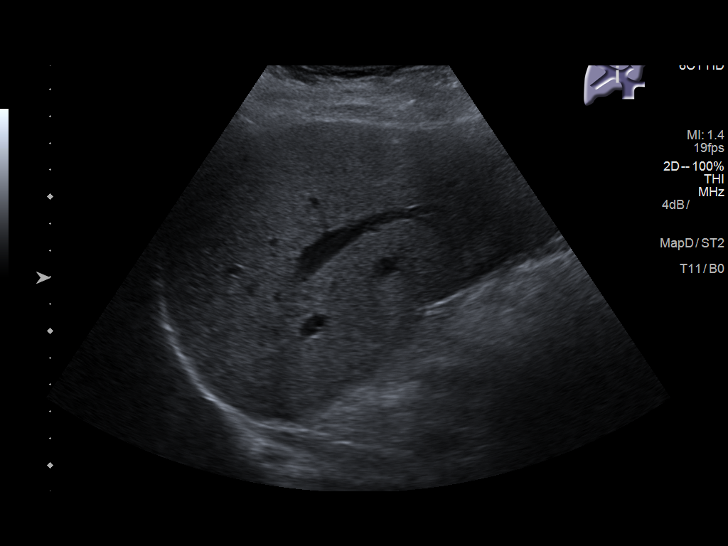
[im 56/112]
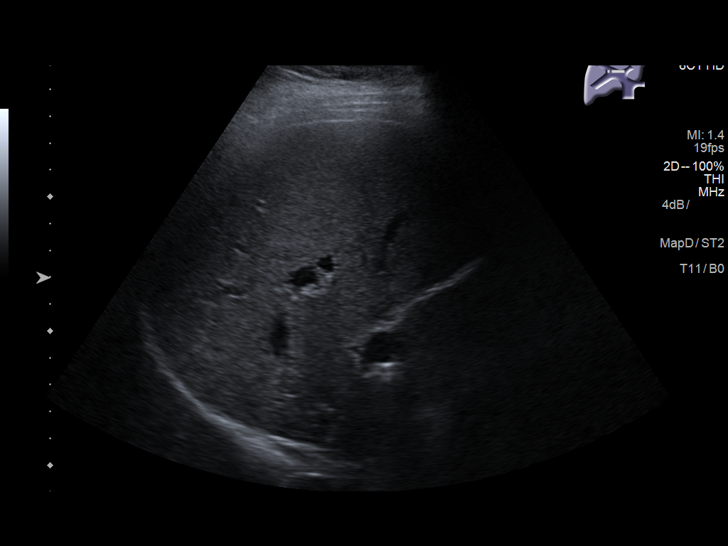
[im 65/112]
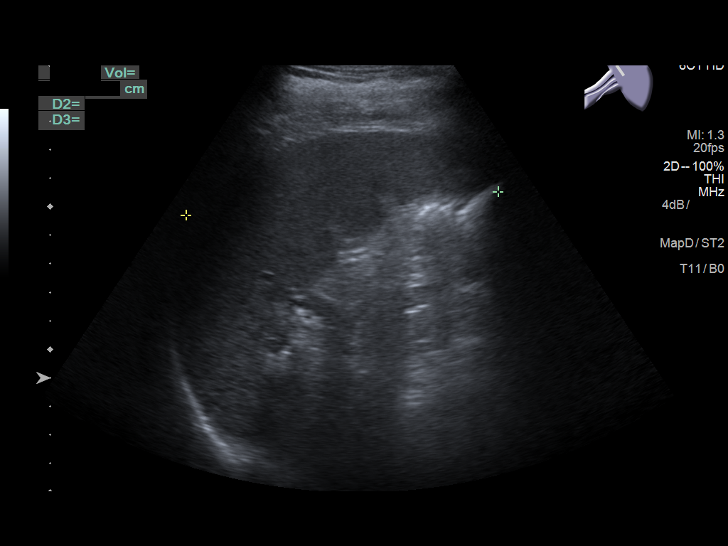
[im 75/112]
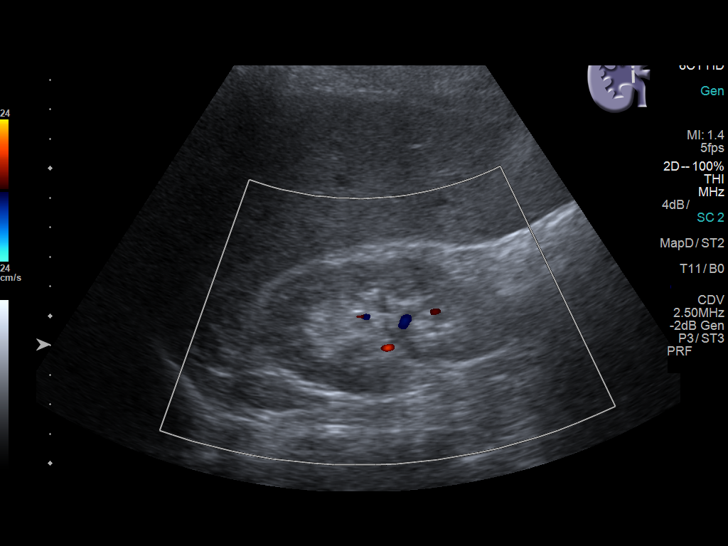
[im 84/112]
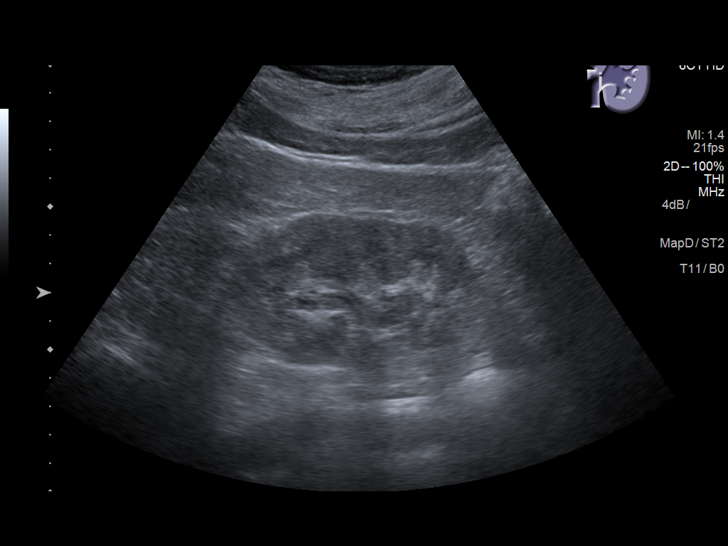
[im 93/112]
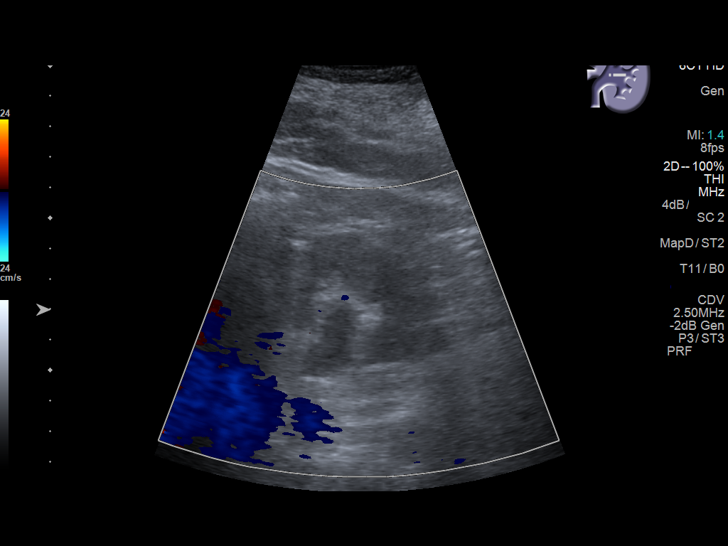
[im 102/112]
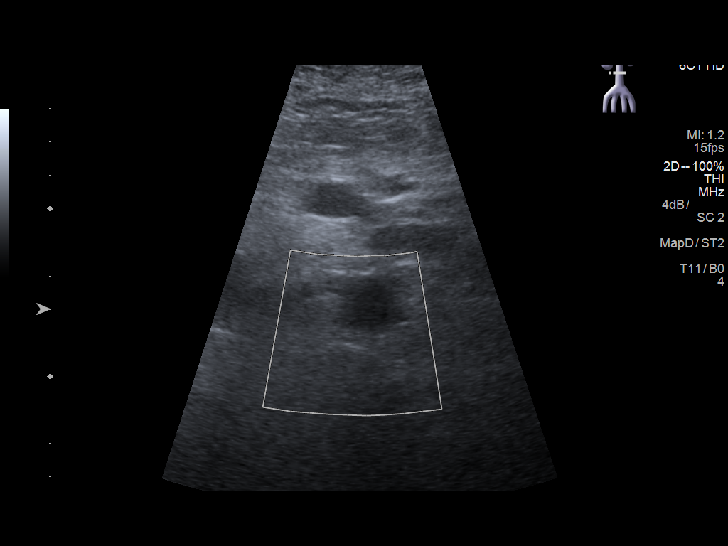
[im 112/112]
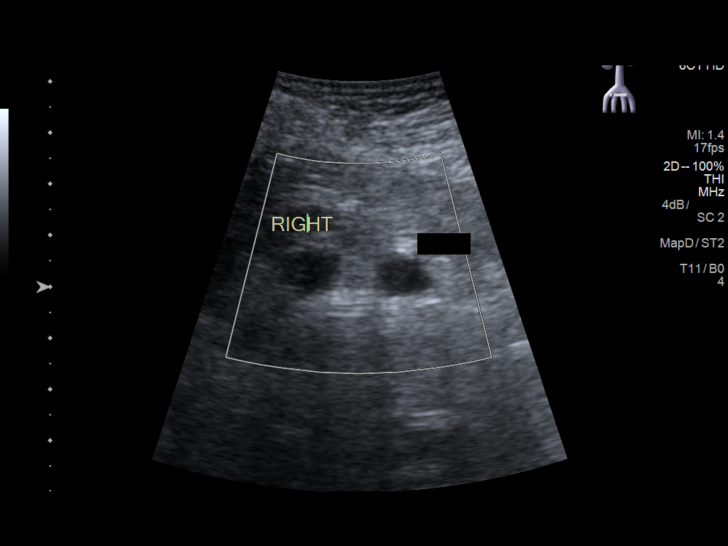

[13 of 25 positions shown; findings below may reference images not displayed]

FINDINGS: Gallbladder: Sludge is noted in the gallbladder lumen. Possible 8 mm
stone is noted in the gallbladder neck. No gallbladder wall
thickening or pericholecystic fluid is noted. No sonographic
Murphy's sign is noted.

Common bile duct: Diameter: 2.7 mm which is within normal limits.

Liver: No focal lesion identified. Within normal limits in
parenchymal echogenicity. Portal vein is patent on color Doppler
imaging with normal direction of blood flow towards the liver.

IVC: Not well visualized due to overlying bowel gas.

Pancreas: Visualized portion unremarkable.

Spleen: Size and appearance within normal limits.

Right Kidney: Length: 9.7 cm. Echogenicity within normal limits. No
mass or hydronephrosis visualized.

Left Kidney: Length: 9.7 cm. Echogenicity within normal limits. No
mass visualized. Mild left hydronephrosis is noted.

Abdominal aorta: No aneurysm visualized.

Other findings: None.
IMPRESSION: Sludge and possible gallstone are noted without gallbladder wall
thickening or pericholecystic fluid.

Mild left hydronephrosis. No definite calculus is noted. CT urogram
may be performed further evaluation.

## 2019-01-02 ENCOUNTER — Encounter: Payer: Self-pay | Admitting: Family Medicine

## 2019-01-04 ENCOUNTER — Ambulatory Visit: Payer: Medicare Other | Admitting: Orthopaedic Surgery

## 2019-01-05 IMAGING — CT CT ABD-PELV W/ CM
2 of 5 series · 16 of 46 positions shown, 18 images · IV contrast (APPLIED)
Comparison: 10/31/2015

CLINICAL DATA: Abdomen pain, diarrhea

EXAM:
CT ABDOMEN AND PELVIS WITH CONTRAST
TECHNIQUE: Multidetector CT imaging of the abdomen and pelvis was performed
using the standard protocol following bolus administration of
intravenous contrast.
CONTRAST:  75mL YREF5F-YBB IOPAMIDOL (YREF5F-YBB) INJECTION 61%

[Series 2: routine abd/pel with · axial · 0.75mm/px · z∈[-689,-229]mm · 13 of 104 slices shown, 15 images]
[im 6/104  soft-tissue]
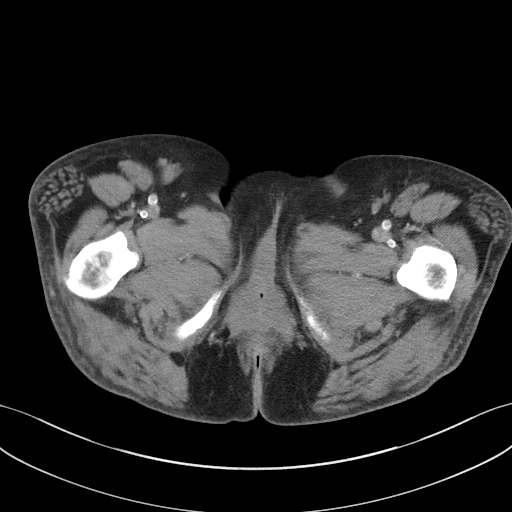
[im 6/104  bone]
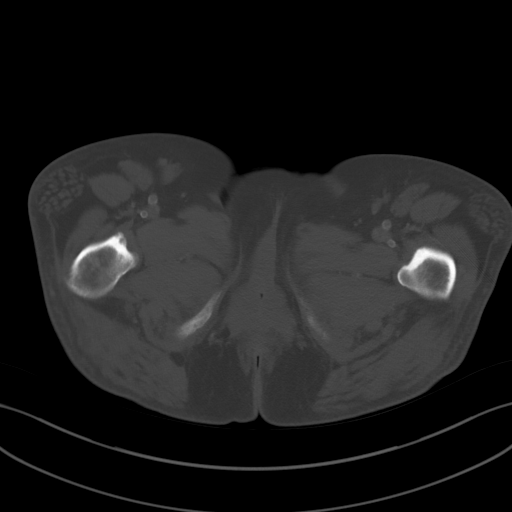
[im 16/104  soft-tissue]
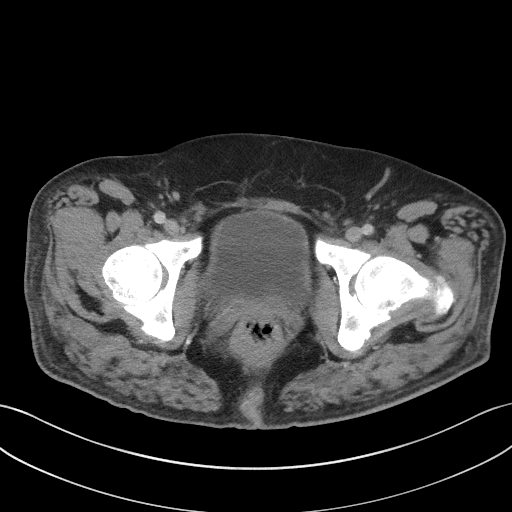
[im 21/104  soft-tissue]
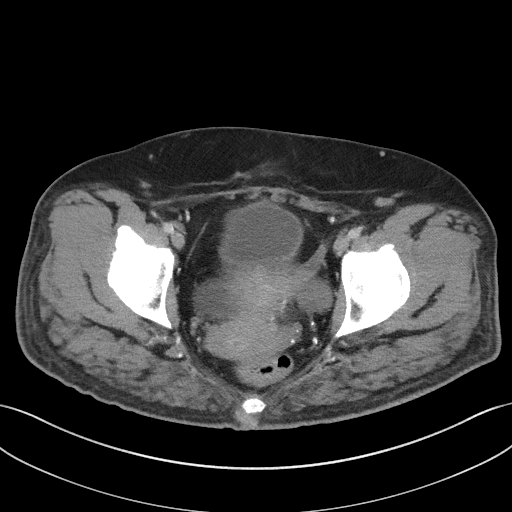
[im 31/104  soft-tissue]
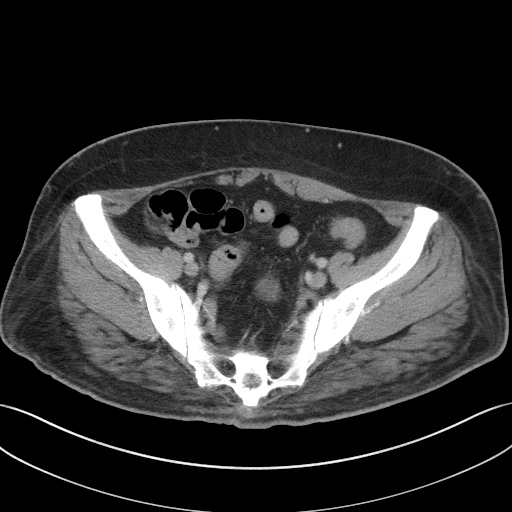
[im 37/104  soft-tissue]
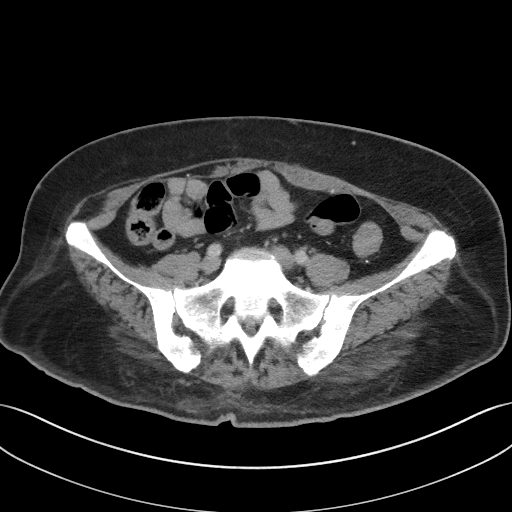
[im 47/104  soft-tissue]
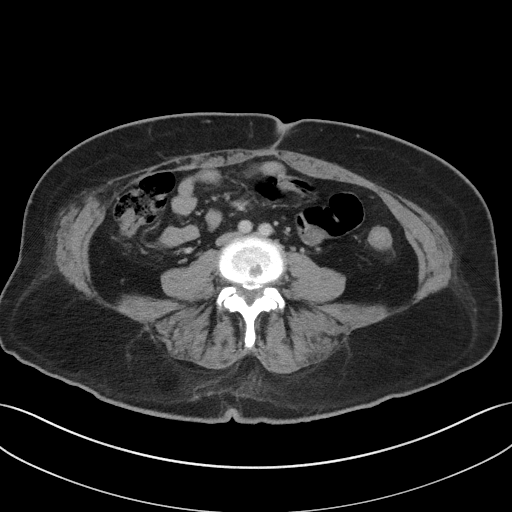
[im 52/104  soft-tissue]
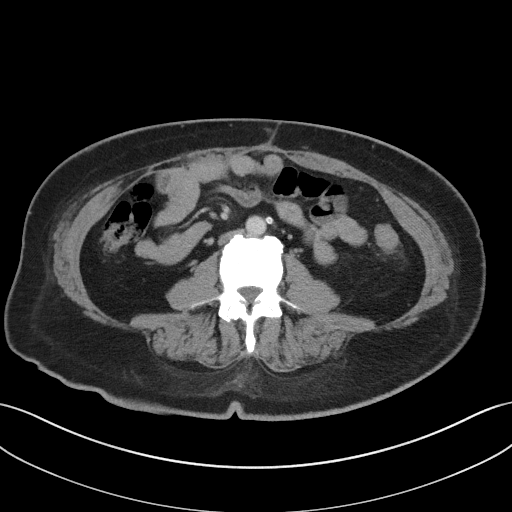
[im 57/104  soft-tissue]
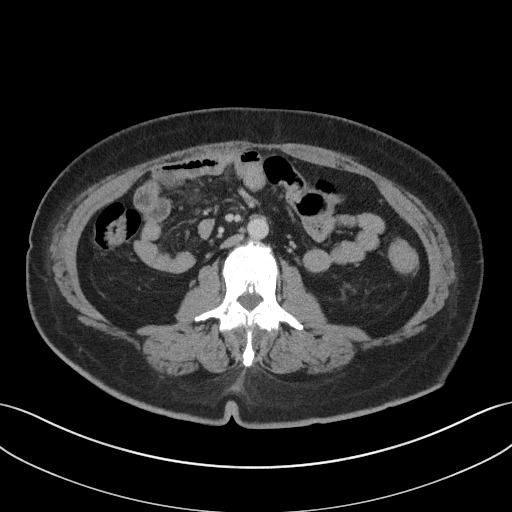
[im 67/104  soft-tissue]
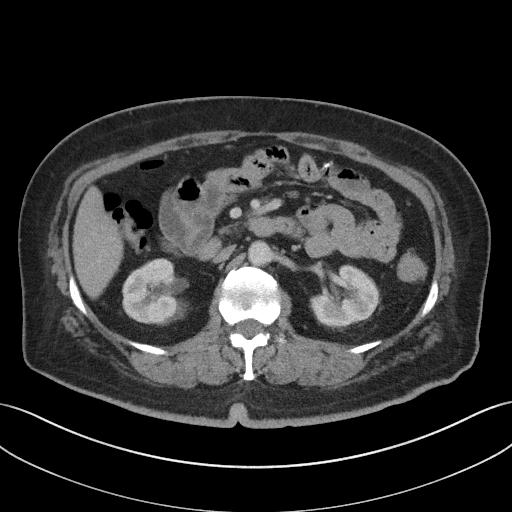
[im 67/104  bone]
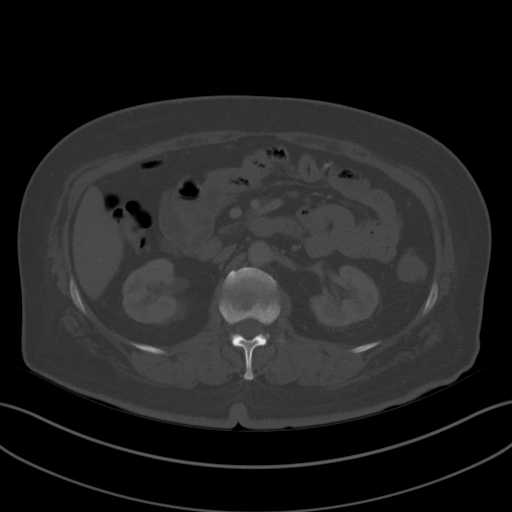
[im 73/104  soft-tissue]
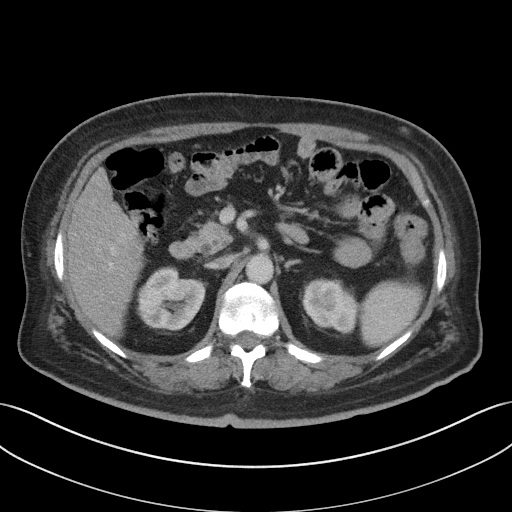
[im 83/104  soft-tissue]
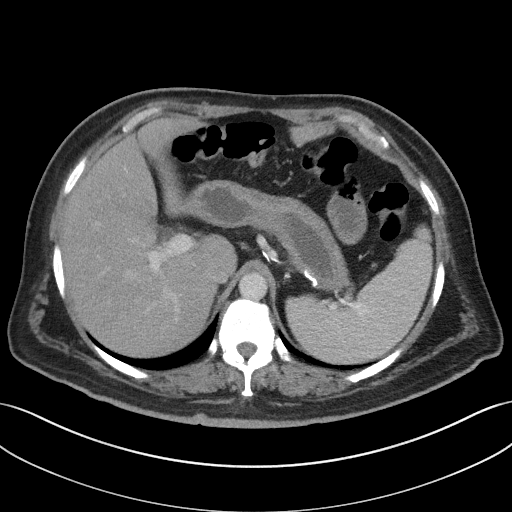
[im 88/104  soft-tissue]
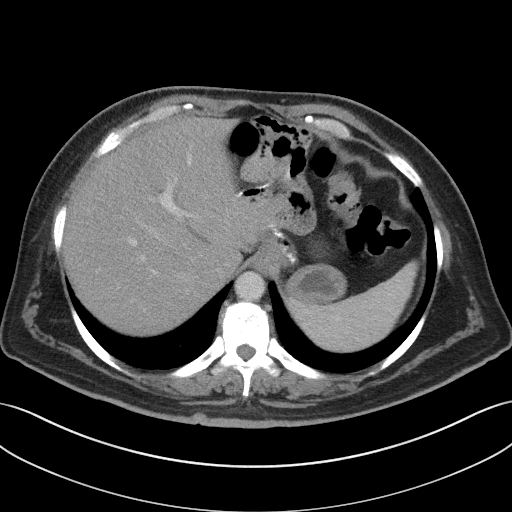
[im 98/104  soft-tissue]
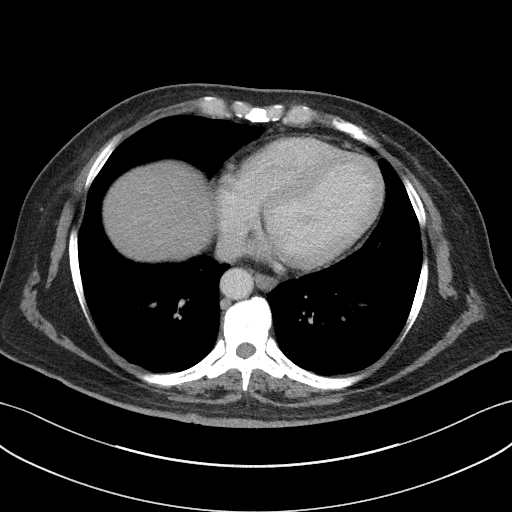

[Series 4: coronal st · coronal · 0.82mm/px · 3 of 79 slices shown]
[im 27/79  soft-tissue]
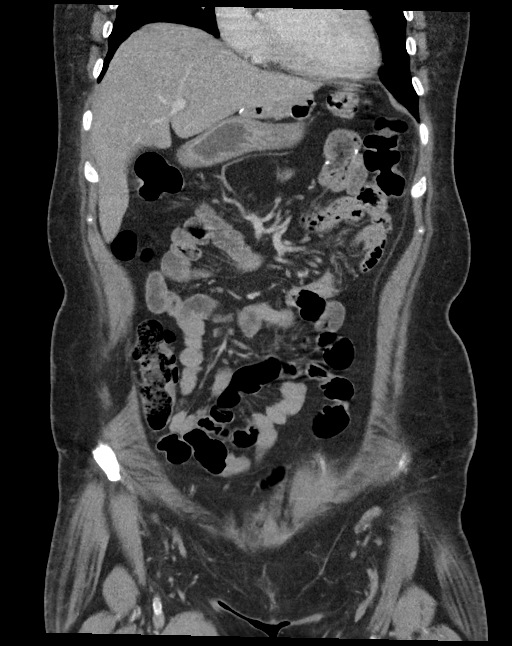
[im 35/79  soft-tissue]
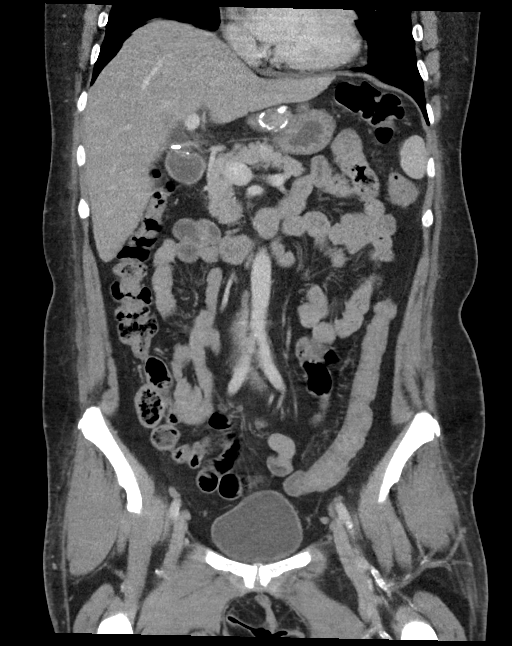
[im 44/79  soft-tissue]
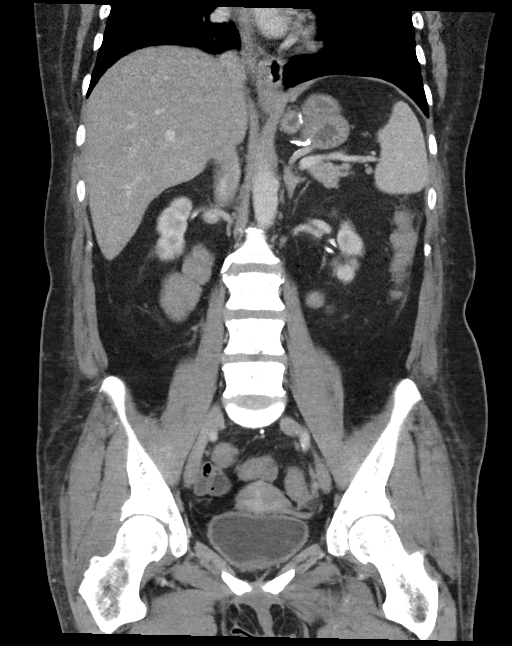

[16 of 46 positions shown; findings below may reference images not displayed]

FINDINGS: Lower chest: Lung bases demonstrate no acute consolidation or
pleural effusion. Heart size is within normal limits.

Hepatobiliary: No focal liver abnormality is seen. Status post
cholecystectomy. No biliary dilatation.

Pancreas: Unremarkable. No pancreatic ductal dilatation or
surrounding inflammatory changes.

Spleen: Normal in size without focal abnormality.

Adrenals/Urinary Tract: Adrenal glands are unremarkable. Kidneys are
without hydronephrosis. Punctate stone in the lower pole of the left
kidney. Bladder within normal limits.

Stomach/Bowel: Status post gastric bypass. No evidence for bowel
obstruction. Collapsed appearance of the descending colon with mild
thickening at the splenic flexure. There appears to be minimal edema
in the fat surrounding the left colon. Nonvisualized appendix
consistent with history of appendectomy. Surgical clip in the left
lower quadrant.

Vascular/Lymphatic: Mild aortic atherosclerosis. No aneurysm. No
significant adenopathy

Reproductive: Uterus and bilateral adnexa are unremarkable.

Other: Negative for free air or free fluid. Tiny fat in the
umbilical region.

Musculoskeletal: Mild degenerative changes of the spine. No acute or
suspicious lesion.
IMPRESSION: 1. Collapsed appearance of the descending colon with suspected mild
wall thickening at the splenic flexure ; there does appear to be
minimal edema surrounding the left colon, and suspect that the
findings are secondary to a mild colitis of infectious or
inflammatory etiology.
2. Interval cholecystectomy
3. Punctate nonobstructing stone in the left kidney
4. Status post gastric bypass with no evidence for a bowel
obstruction

## 2019-01-10 ENCOUNTER — Ambulatory Visit (INDEPENDENT_AMBULATORY_CARE_PROVIDER_SITE_OTHER): Payer: Medicare Other

## 2019-01-10 ENCOUNTER — Encounter: Payer: Self-pay | Admitting: Family Medicine

## 2019-01-10 ENCOUNTER — Other Ambulatory Visit: Payer: Self-pay

## 2019-01-10 ENCOUNTER — Telehealth: Payer: Self-pay

## 2019-01-10 ENCOUNTER — Encounter: Payer: Self-pay | Admitting: Orthopaedic Surgery

## 2019-01-10 ENCOUNTER — Ambulatory Visit (INDEPENDENT_AMBULATORY_CARE_PROVIDER_SITE_OTHER): Payer: Medicare Other | Admitting: Orthopaedic Surgery

## 2019-01-10 ENCOUNTER — Ambulatory Visit: Payer: Self-pay

## 2019-01-10 DIAGNOSIS — K529 Noninfective gastroenteritis and colitis, unspecified: Secondary | ICD-10-CM

## 2019-01-10 DIAGNOSIS — S72145D Nondisplaced intertrochanteric fracture of left femur, subsequent encounter for closed fracture with routine healing: Secondary | ICD-10-CM

## 2019-01-10 IMAGING — CR DG CHEST 2V
2 series · 2 of 2 positions shown · non-contrast
Comparison: PA and lateral chest 12/13/2015.  CT chest 05/31/2015.

CLINICAL DATA: Lower abdominal pain radiating into the back with
nausea, vomiting and diarrhea for a week.

EXAM:
CHEST  2 VIEW

[chest pa]
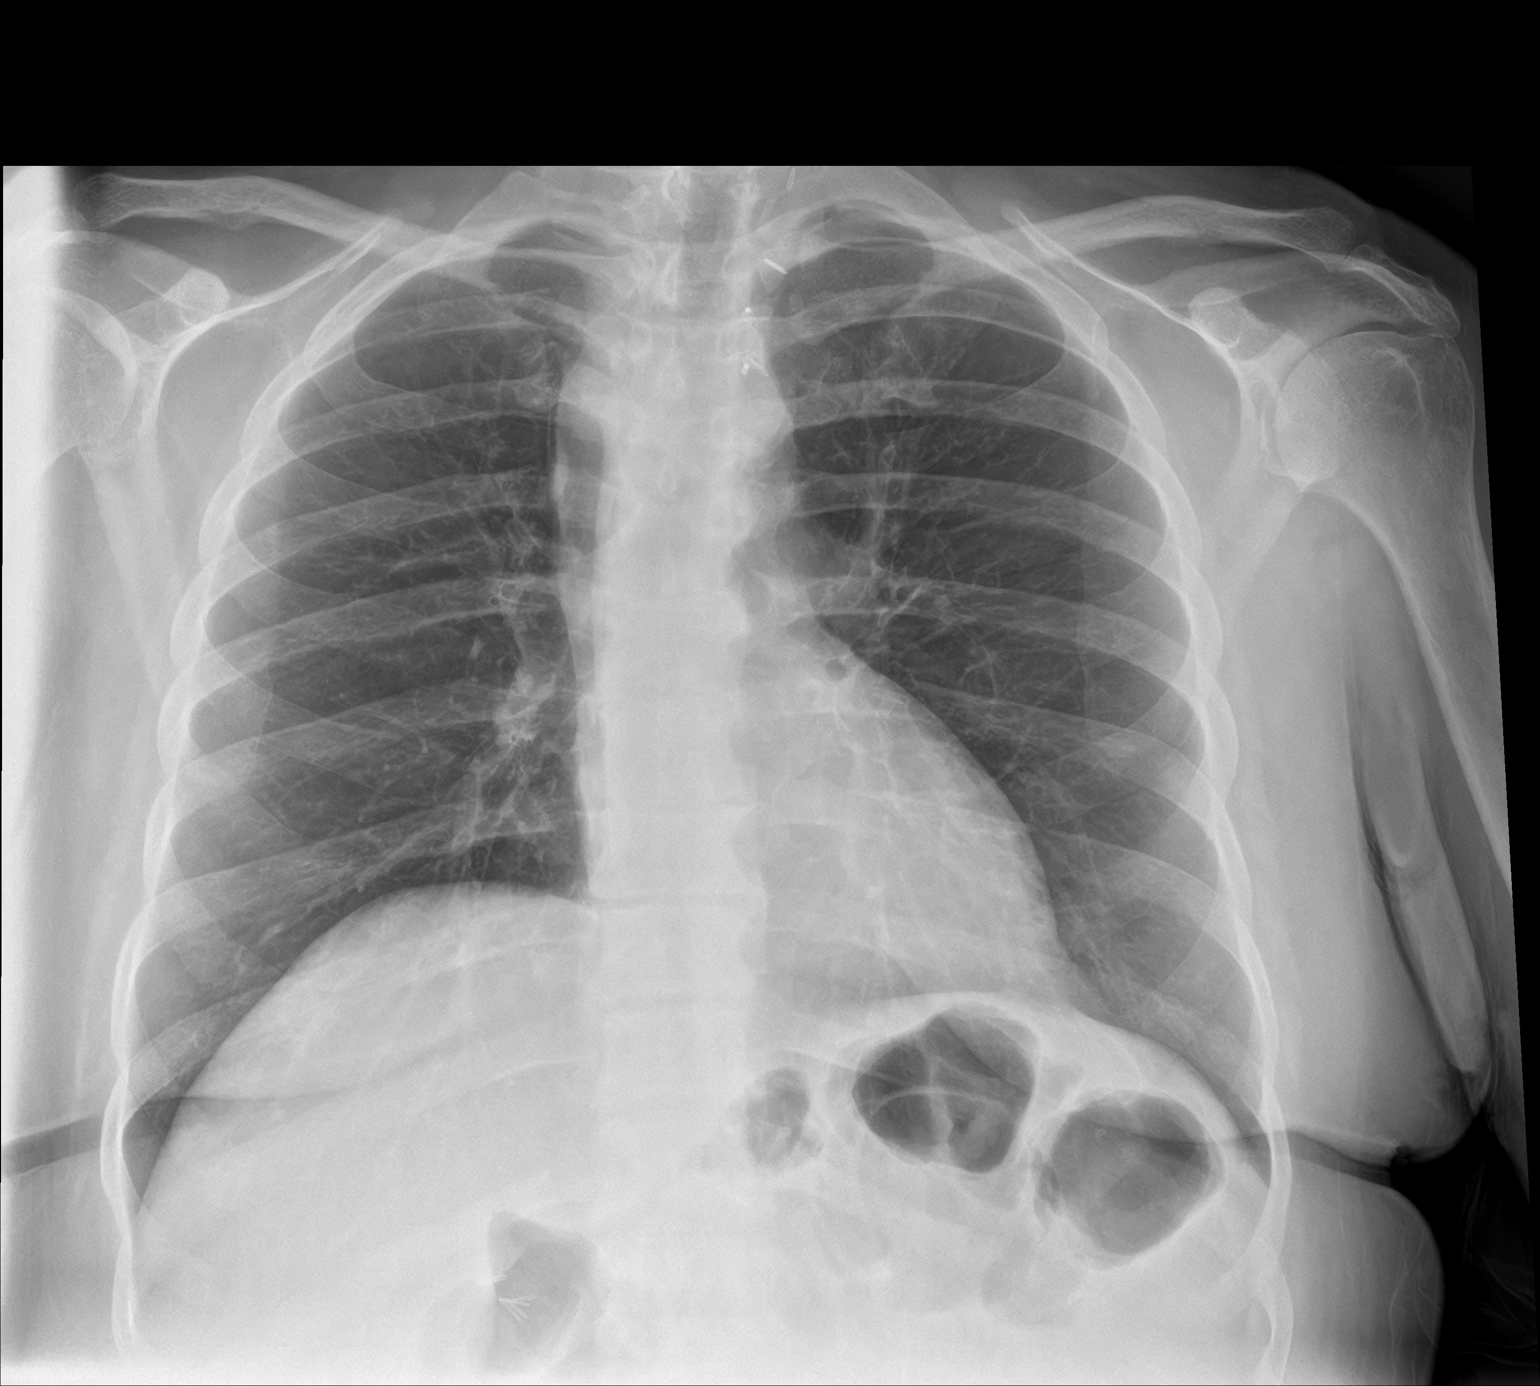

[chest lat]
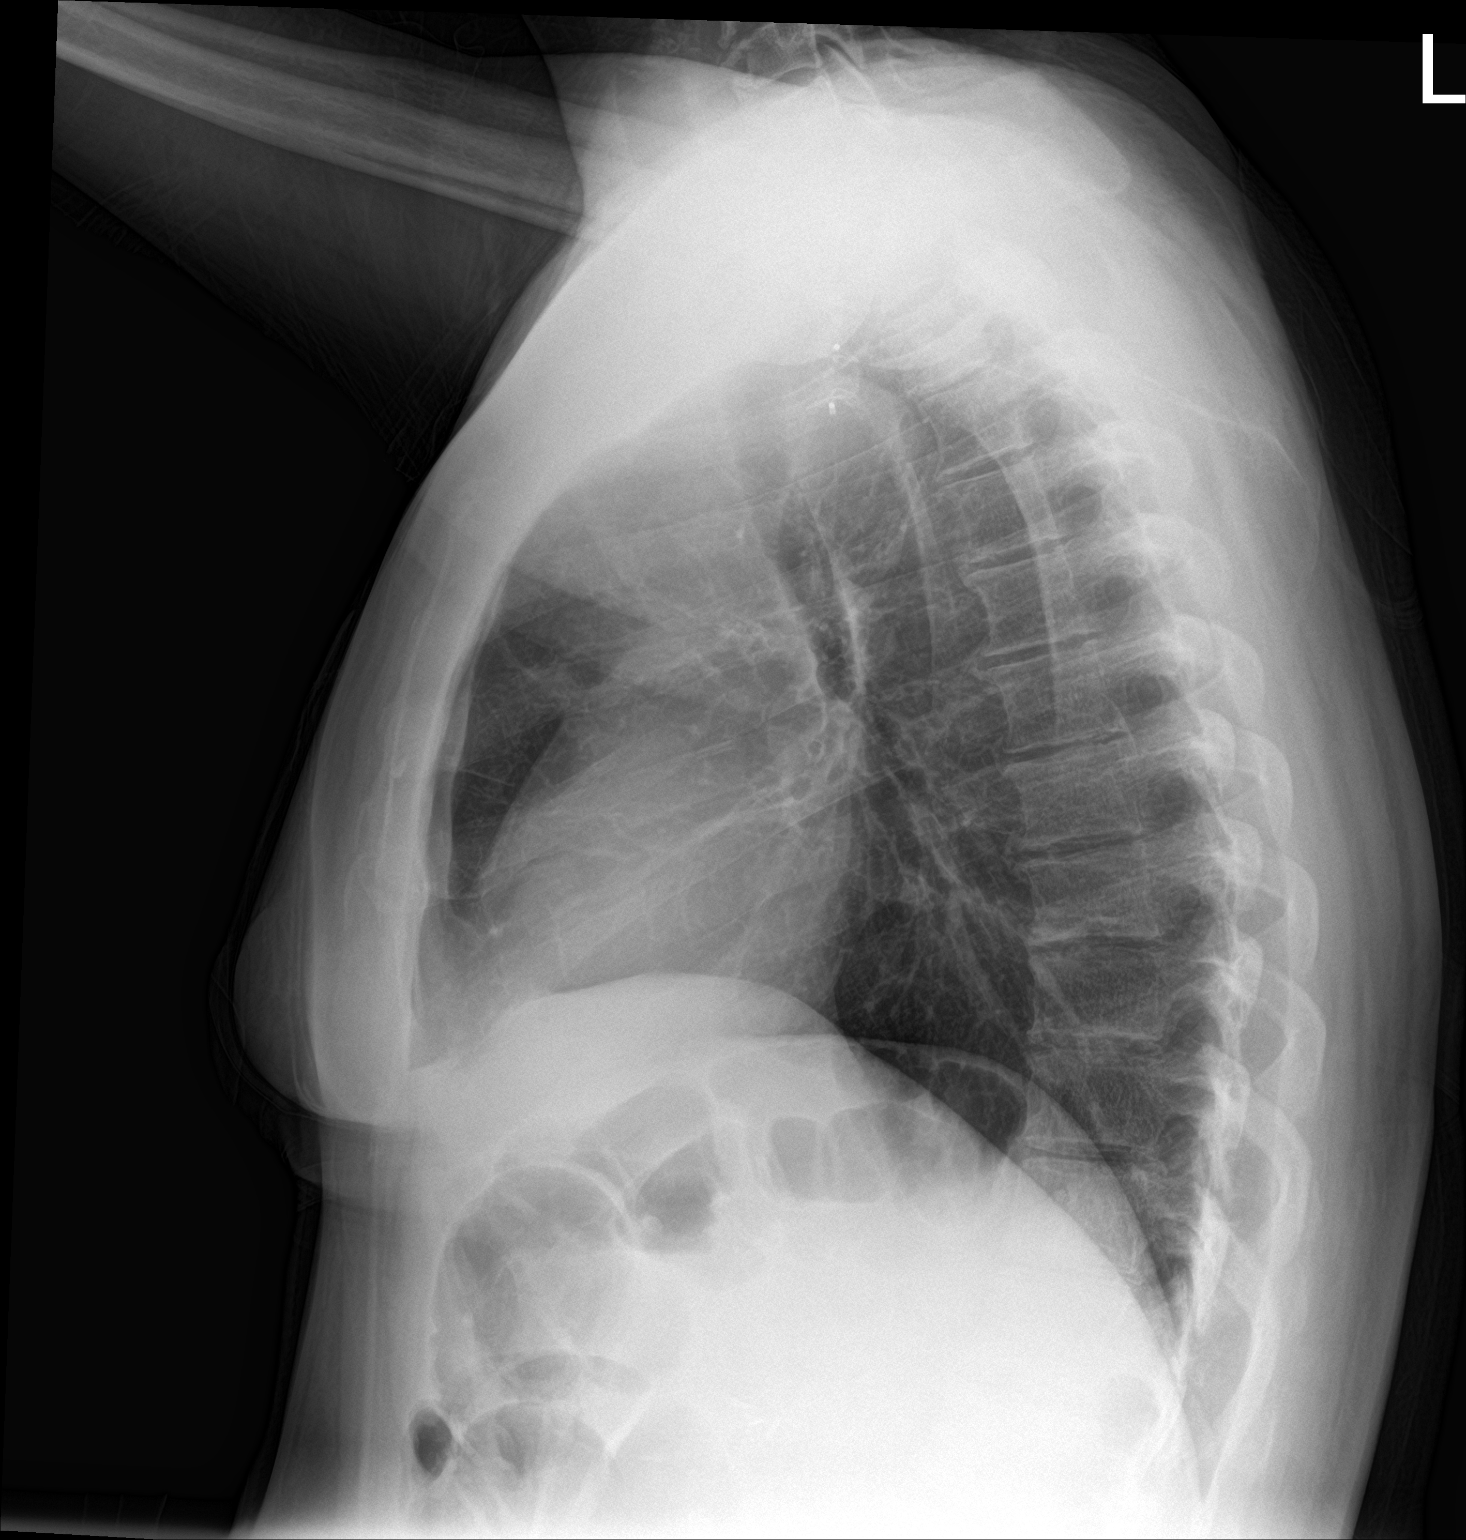

[2 of 2 positions shown; findings below may reference images not displayed]

FINDINGS: The lungs are clear. Heart size is normal. Right aortic arch is
noted. No pneumothorax or pleural effusion. No acute bony
abnormality.
IMPRESSION: No acute disease.

## 2019-01-10 IMAGING — CT CT ABD-PELV W/O CM
2 of 4 series · 16 of 46 positions shown, 18 images · non-contrast
Comparison: CT scan of January 22, 2017.

CLINICAL DATA: Acute lower abdominal and back pain.

EXAM:
CT ABDOMEN AND PELVIS WITHOUT CONTRAST
TECHNIQUE: Multidetector CT imaging of the abdomen and pelvis was performed
following the standard protocol without IV contrast.

[Series 2: routine abd/pel wo · axial · 0.82mm/px · z∈[-443,+32]mm · 13 of 105 slices shown, 15 images]
[im 5/105  soft-tissue]
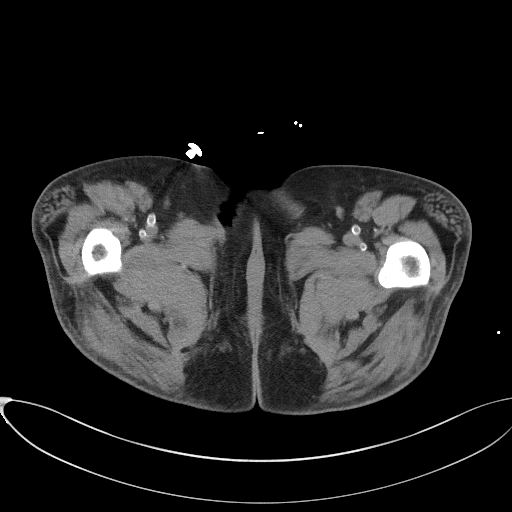
[im 5/105  bone]
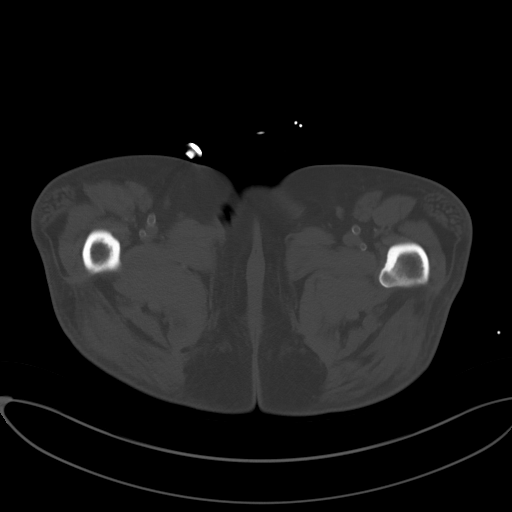
[im 15/105  soft-tissue]
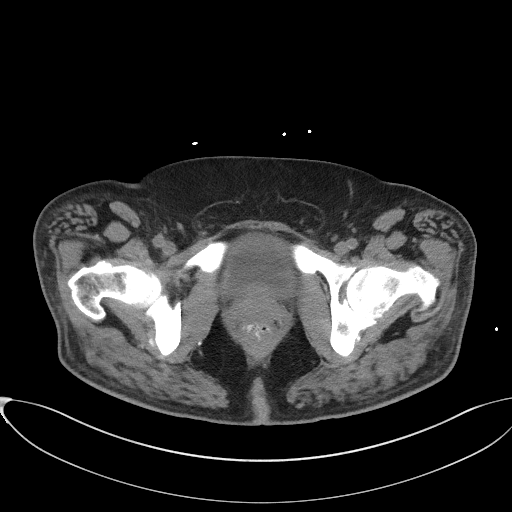
[im 24/105  soft-tissue]
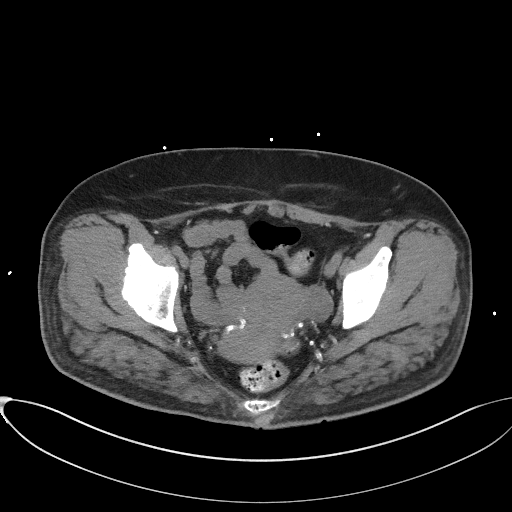
[im 29/105  soft-tissue]
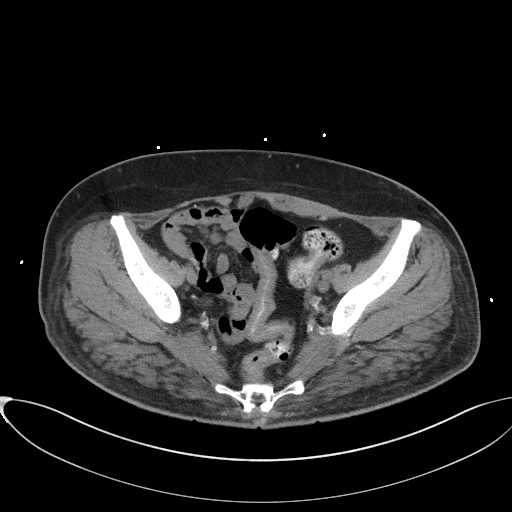
[im 38/105  soft-tissue]
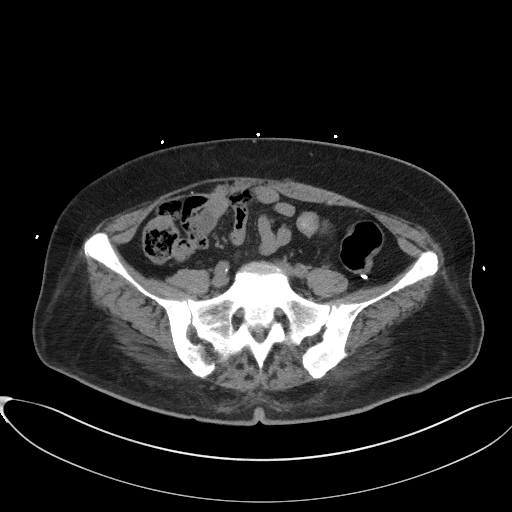
[im 43/105  soft-tissue]
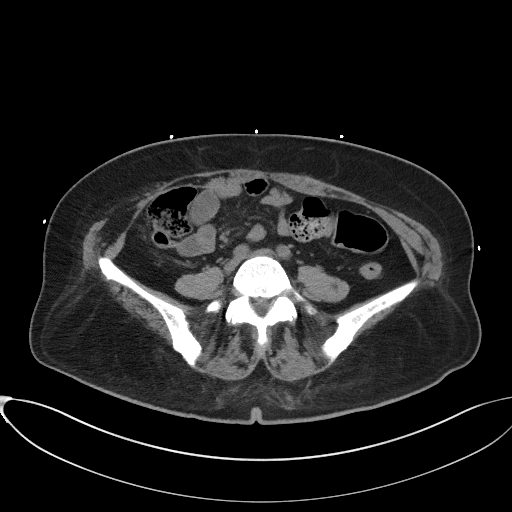
[im 53/105  soft-tissue]
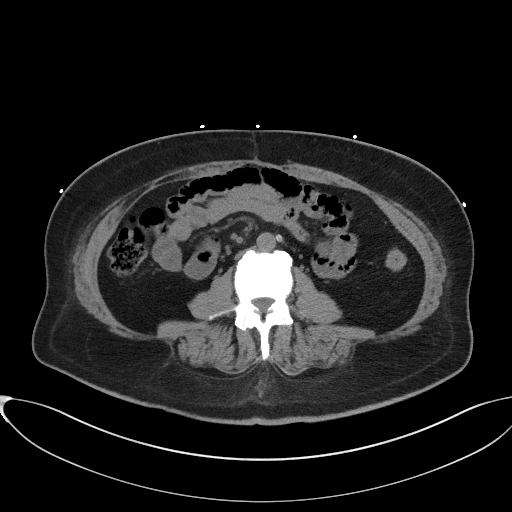
[im 62/105  soft-tissue]
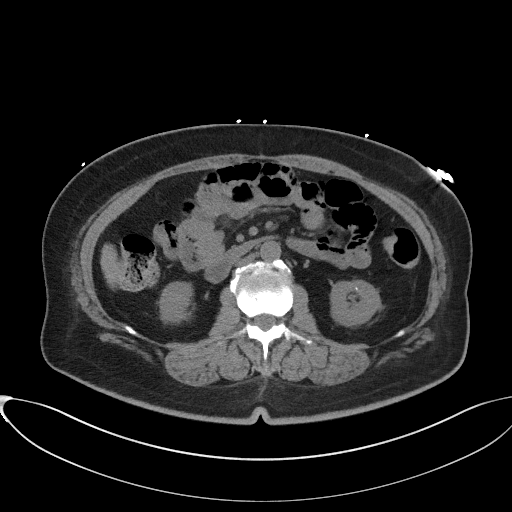
[im 67/105  soft-tissue]
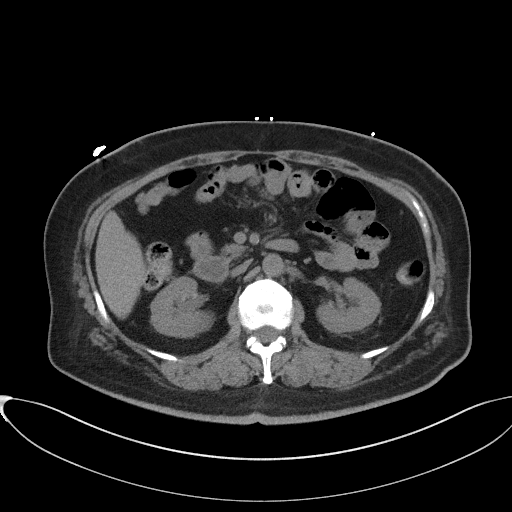
[im 67/105  bone]
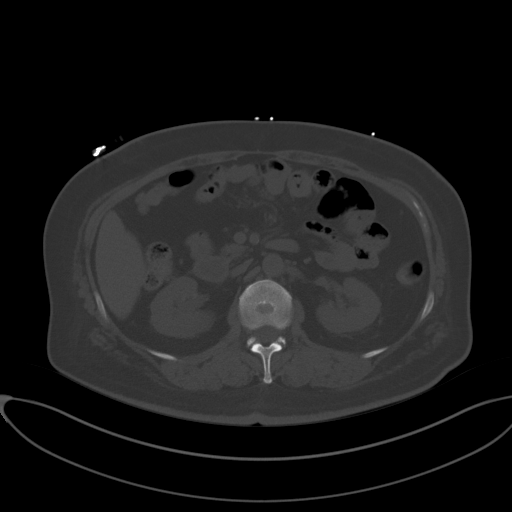
[im 76/105  soft-tissue]
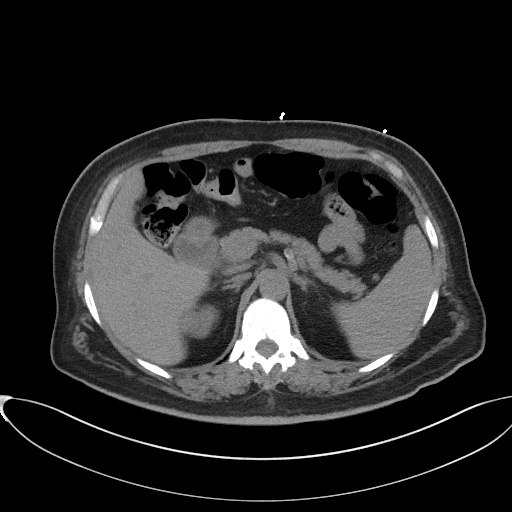
[im 81/105  soft-tissue]
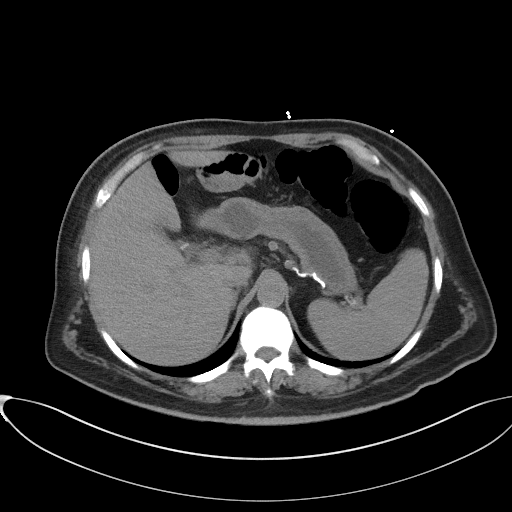
[im 90/105  soft-tissue]
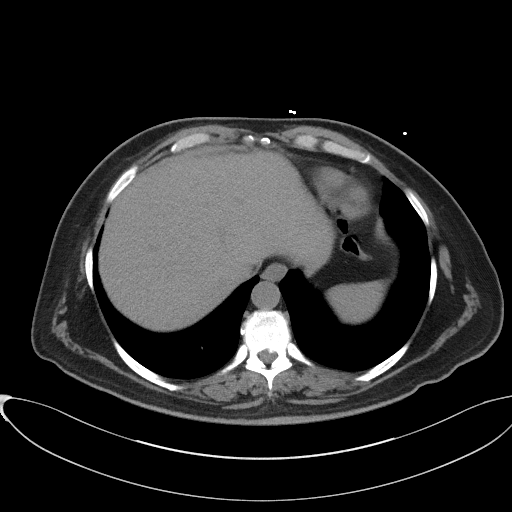
[im 100/105  soft-tissue]
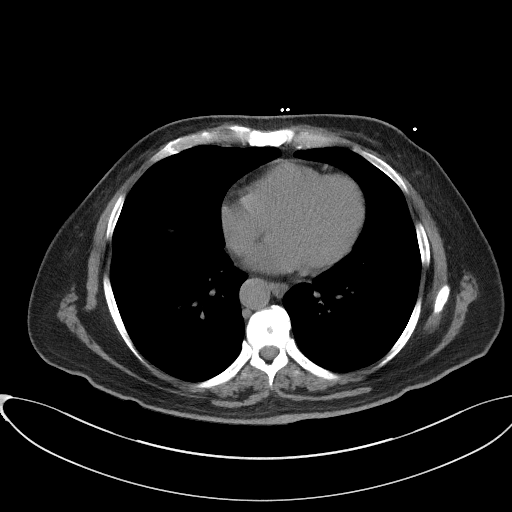

[Series 5: coronal st · coronal · 0.79mm/px · 3 of 81 slices shown]
[im 27/81  soft-tissue]
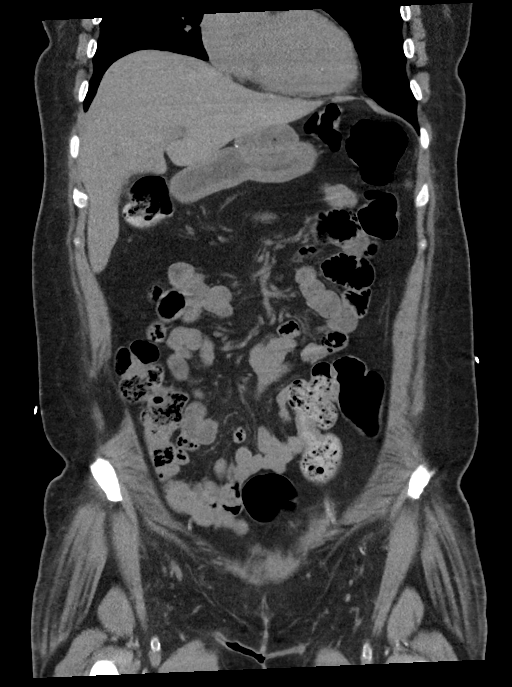
[im 36/81  soft-tissue]
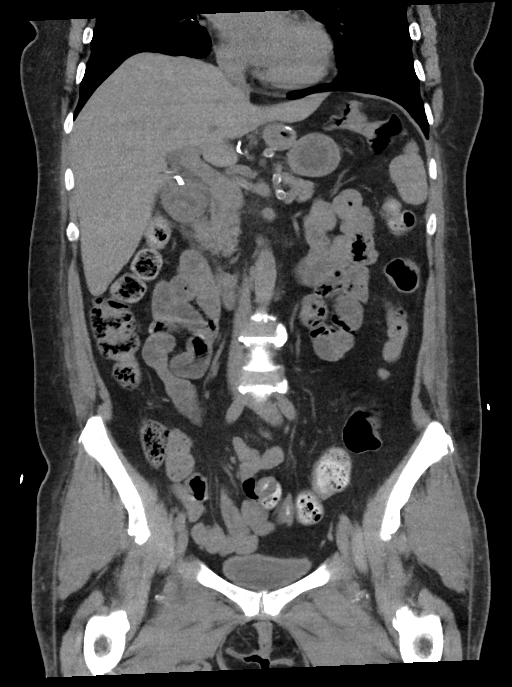
[im 45/81  soft-tissue]
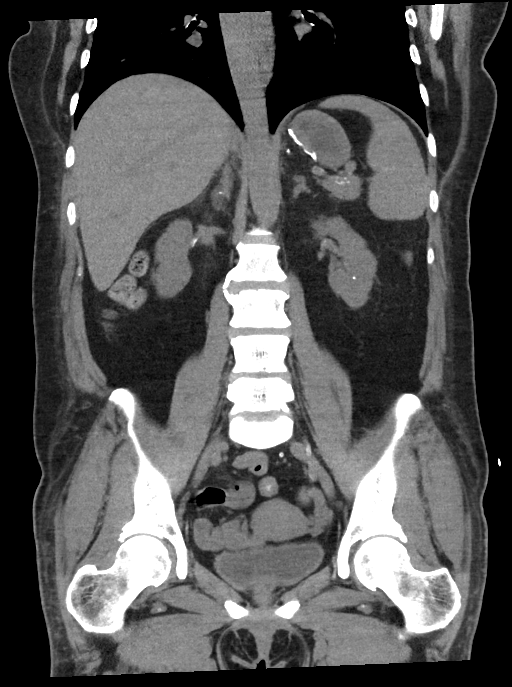

[16 of 46 positions shown; findings below may reference images not displayed]

FINDINGS: Lower chest: No acute abnormality.

Hepatobiliary: No focal liver abnormality is seen. Status post
cholecystectomy. No biliary dilatation.

Pancreas: Unremarkable. No pancreatic ductal dilatation or
surrounding inflammatory changes.

Spleen: Normal in size without focal abnormality.

Adrenals/Urinary Tract: Adrenal glands appear normal. Nonobstructive
left renal calculus is noted. No hydronephrosis or renal obstruction
is noted. No ureteral calculi are noted. Urinary bladder is
unremarkable.

Stomach/Bowel: Status post gastric surgery. There is no evidence of
bowel obstruction or inflammation. Status post appendectomy.

Vascular/Lymphatic: No significant vascular findings are present. No
enlarged abdominal or pelvic lymph nodes.

Reproductive: Uterus and bilateral adnexa are unremarkable.

Other: No abdominal wall hernia or abnormality. No abdominopelvic
ascites.

Musculoskeletal: No acute or significant osseous findings.
IMPRESSION: Nonobstructive left renal calculus. No hydronephrosis or renal
obstruction is noted.

Status post gastric surgery, cholecystectomy and appendectomy.

No acute abnormality seen in the abdomen or pelvis.

## 2019-01-10 MED ORDER — DIPHENOXYLATE-ATROPINE 2.5-0.025 MG PO TABS: ORAL_TABLET | ORAL | 3 refills | Status: DC

## 2019-01-10 NOTE — Progress Notes (Signed)
Post-Op Visit Note   Patient: [name]           Date of Birth: 29-Apr-1970           MRN: [id number] Visit Date: 01/10/2019 PCP: [name], MD   Assessment & Plan:  Chief Complaint:  Chief Complaint  Patient presents with  . Left Hip - Pain   Visit Diagnoses:  1. Closed nondisplaced intertrochanteric fracture of left femur with routine healing, subsequent encounter     Plan: Patient is 3 months status post intramedullary fixation of intertrochanteric fracture.  She is doing well.  She has completed home health physical therapy and is back to ambulating with a cane.  She is pretty close to her normal baseline activity.  X-rays demonstrate healing of the fracture without complication.  I'm able to move her hip around without any pain.  At this point he is released to activity as tolerated.  We will see her back as needed.  Follow-Up Instructions: Return if symptoms worsen or fail to improve.   Orders:  Orders Placed This Encounter  Procedures  . XR FEMUR MIN 2 VIEWS LEFT   No orders of the defined types were placed in this encounter.   Imaging: Xr Femur Min 2 Views Left  Result Date: 01/10/2019 Healed intertrochanteric fracture   PMFS History: Patient Active Problem List   Diagnosis Date Noted  . Depression, major, single episode, moderate ([place]) 12/09/2018  . Fall   . Closed nondisplaced intertrochanteric fracture of left femur ([place]) 10/12/2018  . Femur fracture, left ([place]) 10/12/2018  . Hypotension 06/26/2018  . Autonomic neuropathy 03/24/2018  . Acute delirium 03/03/2018  . H/O gastric bypass 03/02/2018  . Hypertension associated with stage 3 chronic kidney disease due to type 2 diabetes mellitus ([place]) 02/20/2018  . SI (sacroiliac) joint dysfunction 12/02/2017  . MI, acute, non ST segment elevation ([place])   . Effort angina ([place]) 06/29/2017  . Unstable angina ([place]) 06/24/2017  . Syncope 04/09/2017  . Insomnia 03/18/2017  . Ischemic  cardiomyopathy   . Arthritis   . Anxiety   . Tendinitis of upper biceps tendon of right shoulder 03/16/2017  . Degenerative tear of glenoid labrum of right shoulder 03/15/2017  . Acute colitis 01/27/2017  . Hx of colonic polyps   . H/O medication noncompliance 12/14/2015  . Emesis   . Atherosclerosis of native coronary artery of native heart with stable angina pectoris ([place])   . Hypertensive heart disease   . CKD (chronic kidney disease), stage III ([place]) 06/05/2015  . Status post bariatric surgery 06/04/2015  . Colitis 06/03/2015  . Carotid stenosis 04/30/2015  . Type 2 diabetes mellitus with stage 3 chronic kidney disease, without long-term current use of insulin ([place])   . Stable angina pectoris ([place]) 04/17/2015  . Iron deficiency anemia 03/22/2015  . Vitamin B12 deficiency 02/18/2015  . Misuse of medications for pain 02/18/2015  . Major depressive disorder, recurrent, severe with psychotic features ([place]) 02/15/2015  . Helicobacter pylori infection 11/23/2014  . Hemiparesis, left ([place]) 11/23/2014  . Benign neoplasm of colon 11/20/2014  . Malignant melanoma ([place]) 08/25/2014  . Chronic systolic CHF (congestive heart failure) ([place])   . Incomplete bladder emptying 07/12/2014  . Hypothyroidism 12/30/2013  . Aberrant subclavian artery 11/17/2013  . Multiple sclerosis ([place]) 11/02/2013  . History of CVA with residual deficit 06/20/2013  . Headache, migraine 05/29/2013  . Hyperlipidemia   . GERD (gastroesophageal reflux disease)   . Neuropathy ([place]) 01/02/2011  .  Stroke ([place]) 06/21/2008  . Depression with anxiety 05/01/2008  . Essential hypertension 05/01/2008   Past Medical History:  Diagnosis Date  . Acute pyelonephritis   . Anemia    iron deficiency anemia  . Aortic arch aneurysm ([place])   . Bipolar disorder ([place])   . BRCA negative 2014  . CAD (coronary artery disease)    a. 08/2003 Cath: LAD 30-40-med Rx; b. 11/2014 PCI: LAD 50m(3.25x23 Xience Alpine DES); c. 06/2015 PCI: D1  (2.25x12 Resolute Integrity DES); d. 06/2017 PCI: Patent mLAD stent, D2 95 (PTCA); e. 09/2017 PCI: D2 99ost (CBA); d. 12/2017 Cath: LM nl, LAD 340m80d (small), D1 40ost, D2 95ost, LCX 40p, RCA 40ost/p->Med rx for D2 given restenosis.  . CKD (chronic kidney disease), stage III   . Colon polyp   . CVA (cerebral vascular accident) ([place]   Left side weakness.   . Diabetes (HCR[place]  . Family history of breast cancer    BRCA neg 2014  . Gastric ulcer 04/27/2011  . History of echocardiogram    a. 03/2017 Echo: EF 60-65%, no rwma; b. 02/2018 Echo: EF 60-65%, no rwma. Nl RV fxn. No cardiac source of emboli (admitted w/ stroke).  . Marland KitchenTN (hypertension)   . Hyperlipemia   . Hypothyroid   . Malignant melanoma of skin of scalp (HCRice  . MI, acute, non ST segment elevation (HCGary  . Neuromuscular disorder ([place]  . Orthostatic hypotension   . S/P drug eluting coronary stent placement 06/04/2015  . Sepsis (HCColeman2/14/2019  . [facility]   a. 02/2018 MRI: 16m55mate acute/early subacute L medial frontal lobe inarct; b. 02/2018 MRA No large vessel occlusion or aneurysm. Mod to sev L P2 stenosis. thready L vertebral artery, diffusely dzs'd; c. 02/2018 Carotid U/S: <50% bilat ICA dzs.    Family History  Problem Relation Age of Onset  . Hypertension Mother   . Anxiety disorder Mother   . Depression Mother   . Bipolar disorder Mother   . Heart disease Mother        No details  . Hyperlipidemia Mother   . Kidney disease Father   . Heart disease Father 40 59 Hypertension Father   . Diabetes Father   . Stroke Father   . Colon cancer Father        dx in his 40'1's Anxiety disorder Father   . Depression Father   . Skin cancer Father   . Kidney disease Sister   . Thyroid nodules Sister   . Hypertension Sister   . Hypertension Sister   . Diabetes Sister   . Hyperlipidemia Sister   . Depression Sister   . Breast cancer Maternal Aunt 50 76 Breast cancer Maternal Aunt 50 73 Ovarian cancer Cousin   . Colon  cancer Cousin   . Breast cancer Other   . Kidney cancer Neg Hx   . Bladder Cancer Neg Hx     Past Surgical History:  Procedure Laterality Date  . APPENDECTOMY    . CARDIAC CATHETERIZATION N/A 11/09/2014   Procedure: Coronary Angiography;  Surgeon: TimMinna MerrittsD;  [facility];  Service: Cardiovascular;  Laterality: N/A;  . CARDIAC CATHETERIZATION N/A 11/12/2014   Procedure: Coronary Stent Intervention;  Surgeon: AleIsaias CowmanD;  [facility];  Service: Cardiovascular;  Laterality: N/A;  . CARDIAC CATHETERIZATION N/A 04/18/2015   Procedure: Left Heart Cath and Coronary Angiography;  Surgeon:  Minna Merritts, MD;  Location: [place] [facility];  Service: Cardiovascular;  Laterality: N/A;  . CARDIAC CATHETERIZATION Left 06/04/2015   Procedure: Left Heart Cath and Coronary Angiography;  Surgeon: Wellington Hampshire, MD;  Location: [place] [facility];  Service: Cardiovascular;  Laterality: Left;  . CARDIAC CATHETERIZATION N/A 06/04/2015   Procedure: Coronary Stent Intervention;  Surgeon: Wellington Hampshire, MD;  Location: [place] [facility];  Service: Cardiovascular;  Laterality: N/A;  . CESAREAN SECTION  2001  . CHOLECYSTECTOMY N/A 11/18/2016   Procedure: LAPAROSCOPIC CHOLECYSTECTOMY WITH INTRAOPERATIVE CHOLANGIOGRAM;  Surgeon: Christene Lye, MD;  Location: [facility];  Service: General;  Laterality: N/A;  . COLONOSCOPY WITH PROPOFOL N/A 04/27/2016   Procedure: COLONOSCOPY WITH PROPOFOL;  Surgeon: [name], MD;  Location: [facility];  Service: Endoscopy;  Laterality: N/A;  . COLONOSCOPY WITH PROPOFOL N/A 01/12/2018   Procedure: COLONOSCOPY WITH PROPOFOL;  Surgeon: Toledo, Benay Pike, MD;  Location: [facility];  Service: Endoscopy;  Laterality: N/A;  . CORONARY ANGIOPLASTY    . CORONARY BALLOON ANGIOPLASTY N/A 06/29/2017   Procedure: CORONARY BALLOON ANGIOPLASTY;  Surgeon: Wellington Hampshire, MD;  Location: [place] [facility];   Service: Cardiovascular;  Laterality: N/A;  . CORONARY BALLOON ANGIOPLASTY N/A 09/20/2017   Procedure: CORONARY BALLOON ANGIOPLASTY;  Surgeon: Wellington Hampshire, MD;  Location: [place] [facility];  Service: Cardiovascular;  Laterality: N/A;  . DILATION AND CURETTAGE OF UTERUS    . ESOPHAGOGASTRODUODENOSCOPY (EGD) WITH PROPOFOL N/A 09/14/2014   Procedure: ESOPHAGOGASTRODUODENOSCOPY (EGD) WITH PROPOFOL;  Surgeon: Josefine Class, MD;  [facility];  Service: Endoscopy;  Laterality: N/A;  . ESOPHAGOGASTRODUODENOSCOPY (EGD) WITH PROPOFOL N/A 04/27/2016   Procedure: ESOPHAGOGASTRODUODENOSCOPY (EGD) WITH PROPOFOL;  Surgeon: [name], MD;  Location: [place];  Service: Endoscopy;  Laterality: N/A;  Diabetic - oral meds  . ESOPHAGOGASTRODUODENOSCOPY (EGD) WITH PROPOFOL N/A 01/12/2018   Procedure: ESOPHAGOGASTRODUODENOSCOPY (EGD) WITH PROPOFOL;  Surgeon: Toledo, Benay Pike, MD;  Location: [facility];  Service: Endoscopy;  Laterality: N/A;  . GASTRIC BYPASS  09/2009   [place] (IM) NAIL INTERTROCHANTERIC Left 10/13/2018   Procedure: INTRAMEDULLARY (IM) NAIL INTERTROCHANTRIC;  Surgeon: Leandrew Koyanagi, MD;  Location: [place];  Service: Orthopedics;  Laterality: Left;  . Left Carotid to sublcavian artery bypass w/ subclavian artery ligation     a. Performed @ [facility].  . LEFT HEART CATH AND CORONARY ANGIOGRAPHY Left 06/29/2017   Procedure: LEFT HEART CATH AND CORONARY ANGIOGRAPHY;  Surgeon: Wellington Hampshire, MD;  Location: [place] [facility];  Service: Cardiovascular;  Laterality: Left;  . LEFT HEART CATH AND CORONARY ANGIOGRAPHY N/A 09/20/2017   Procedure: LEFT HEART CATH AND CORONARY ANGIOGRAPHY;  Surgeon: Wellington Hampshire, MD;  Location: [place] [facility];  Service: Cardiovascular;  Laterality: N/A;  . LEFT HEART CATH AND CORONARY ANGIOGRAPHY N/A 12/20/2017   Procedure: LEFT HEART CATH AND CORONARY ANGIOGRAPHY;  Surgeon: Wellington Hampshire, MD;  Location: [place] [facility];  Service: Cardiovascular;  Laterality: N/A;  . MELANOMA EXCISION  2016   Dr. Evorn Gong  . [place]  2002  . RIGHT OOPHORECTOMY    . SHOULDER ARTHROSCOPY WITH OPEN ROTATOR CUFF REPAIR Right 01/07/2016   Procedure: SHOULDER ARTHROSCOPY WITH DEBRIDMENT, SUBACHROMIAL DECOMPRESSION;  Surgeon: Corky Mull, MD;  Location: [facility];  Service: Orthopedics;  Laterality: Right;  . SHOULDER ARTHROSCOPY WITH OPEN ROTATOR CUFF REPAIR Right 03/16/2017   Procedure: SHOULDER ARTHROSCOPY WITH OPEN  ROTATOR CUFF REPAIR POSSIBLE BICEPS TENODESIS;  Surgeon: Corky Mull, MD;  Location: [facility];  Service: Orthopedics;  Laterality: Right;  . TRIGGER FINGER RELEASE Right     [place]   Social History   Occupational History  . 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 Disabled through 08/02/2013 per forms 02/03/2013  Tobacco Use  . Smoking status: Former Smoker    Types: Cigarettes    Quit date: 08/31/1994    Years since quitting: 24.3  . Smokeless tobacco: Never Used  . Tobacco comment: quit 28 years ago  Substance and Sexual Activity  . Alcohol use: No    Alcohol/week: 0.0 standard drinks  . Drug use: No  . Sexual activity: Not Currently    Birth control/protection: None

## 2019-01-18 ENCOUNTER — Encounter: Payer: Self-pay | Admitting: *Deleted

## 2019-01-19 ENCOUNTER — Telehealth: Payer: Self-pay | Admitting: Family Medicine

## 2019-01-19 ENCOUNTER — Encounter: Payer: Self-pay | Admitting: Emergency Medicine

## 2019-01-19 ENCOUNTER — Emergency Department: Payer: Medicare Other

## 2019-01-19 ENCOUNTER — Other Ambulatory Visit: Payer: Self-pay

## 2019-01-19 ENCOUNTER — Inpatient Hospital Stay
Admission: EM | Admit: 2019-01-19 | Discharge: 2019-01-23 | DRG: 280 | Disposition: A | Discharge status: Home / Self Care | Payer: Medicare Other | Attending: Internal Medicine | Admitting: Internal Medicine

## 2019-01-19 DIAGNOSIS — Z8711 Personal history of peptic ulcer disease: Secondary | ICD-10-CM

## 2019-01-19 DIAGNOSIS — I213 ST elevation (STEMI) myocardial infarction of unspecified site: Secondary | ICD-10-CM | POA: Diagnosis not present

## 2019-01-19 DIAGNOSIS — E039 Hypothyroidism, unspecified: Secondary | ICD-10-CM | POA: Diagnosis present

## 2019-01-19 DIAGNOSIS — E1142 Type 2 diabetes mellitus with diabetic polyneuropathy: Secondary | ICD-10-CM | POA: Diagnosis not present

## 2019-01-19 DIAGNOSIS — I2510 Atherosclerotic heart disease of native coronary artery without angina pectoris: Secondary | ICD-10-CM

## 2019-01-19 DIAGNOSIS — R778 Other specified abnormalities of plasma proteins: Secondary | ICD-10-CM

## 2019-01-19 DIAGNOSIS — Z7902 Long term (current) use of antithrombotics/antiplatelets: Secondary | ICD-10-CM

## 2019-01-19 DIAGNOSIS — Z8041 Family history of malignant neoplasm of ovary: Secondary | ICD-10-CM

## 2019-01-19 DIAGNOSIS — R079 Chest pain, unspecified: Secondary | ICD-10-CM | POA: Diagnosis present

## 2019-01-19 DIAGNOSIS — Z79899 Other long term (current) drug therapy: Secondary | ICD-10-CM

## 2019-01-19 DIAGNOSIS — Z841 Family history of disorders of kidney and ureter: Secondary | ICD-10-CM

## 2019-01-19 DIAGNOSIS — I252 Old myocardial infarction: Secondary | ICD-10-CM

## 2019-01-19 DIAGNOSIS — Z7984 Long term (current) use of oral hypoglycemic drugs: Secondary | ICD-10-CM

## 2019-01-19 DIAGNOSIS — Z888 Allergy status to other drugs, medicaments and biological substances status: Secondary | ICD-10-CM

## 2019-01-19 DIAGNOSIS — E785 Hyperlipidemia, unspecified: Secondary | ICD-10-CM | POA: Diagnosis present

## 2019-01-19 DIAGNOSIS — Z87891 Personal history of nicotine dependence: Secondary | ICD-10-CM

## 2019-01-19 DIAGNOSIS — Z955 Presence of coronary angioplasty implant and graft: Secondary | ICD-10-CM

## 2019-01-19 DIAGNOSIS — I160 Hypertensive urgency: Secondary | ICD-10-CM | POA: Diagnosis not present

## 2019-01-19 DIAGNOSIS — I130 Hypertensive heart and chronic kidney disease with heart failure and stage 1 through stage 4 chronic kidney disease, or unspecified chronic kidney disease: Secondary | ICD-10-CM | POA: Diagnosis present

## 2019-01-19 DIAGNOSIS — Z803 Family history of malignant neoplasm of breast: Secondary | ICD-10-CM

## 2019-01-19 DIAGNOSIS — Z85820 Personal history of malignant melanoma of skin: Secondary | ICD-10-CM

## 2019-01-19 DIAGNOSIS — I25110 Atherosclerotic heart disease of native coronary artery with unstable angina pectoris: Secondary | ICD-10-CM | POA: Diagnosis present

## 2019-01-19 DIAGNOSIS — F319 Bipolar disorder, unspecified: Secondary | ICD-10-CM | POA: Diagnosis present

## 2019-01-19 DIAGNOSIS — Z808 Family history of malignant neoplasm of other organs or systems: Secondary | ICD-10-CM

## 2019-01-19 DIAGNOSIS — K921 Melena: Secondary | ICD-10-CM

## 2019-01-19 DIAGNOSIS — I200 Unstable angina: Secondary | ICD-10-CM | POA: Diagnosis not present

## 2019-01-19 DIAGNOSIS — I5031 Acute diastolic (congestive) heart failure: Secondary | ICD-10-CM | POA: Diagnosis present

## 2019-01-19 DIAGNOSIS — Z8349 Family history of other endocrine, nutritional and metabolic diseases: Secondary | ICD-10-CM

## 2019-01-19 DIAGNOSIS — Z8249 Family history of ischemic heart disease and other diseases of the circulatory system: Secondary | ICD-10-CM

## 2019-01-19 DIAGNOSIS — Z90721 Acquired absence of ovaries, unilateral: Secondary | ICD-10-CM

## 2019-01-19 DIAGNOSIS — E1122 Type 2 diabetes mellitus with diabetic chronic kidney disease: Secondary | ICD-10-CM | POA: Diagnosis present

## 2019-01-19 DIAGNOSIS — Z823 Family history of stroke: Secondary | ICD-10-CM

## 2019-01-19 DIAGNOSIS — Z800 Family history of malignant neoplasm of digestive organs: Secondary | ICD-10-CM

## 2019-01-19 DIAGNOSIS — G629 Polyneuropathy, unspecified: Secondary | ICD-10-CM | POA: Diagnosis present

## 2019-01-19 DIAGNOSIS — Z818 Family history of other mental and behavioral disorders: Secondary | ICD-10-CM

## 2019-01-19 DIAGNOSIS — Z20828 Contact with and (suspected) exposure to other viral communicable diseases: Secondary | ICD-10-CM | POA: Diagnosis present

## 2019-01-19 DIAGNOSIS — Z9884 Bariatric surgery status: Secondary | ICD-10-CM

## 2019-01-19 DIAGNOSIS — D509 Iron deficiency anemia, unspecified: Secondary | ICD-10-CM | POA: Diagnosis present

## 2019-01-19 DIAGNOSIS — E876 Hypokalemia: Secondary | ICD-10-CM | POA: Diagnosis present

## 2019-01-19 DIAGNOSIS — Z9049 Acquired absence of other specified parts of digestive tract: Secondary | ICD-10-CM

## 2019-01-19 DIAGNOSIS — Z833 Family history of diabetes mellitus: Secondary | ICD-10-CM

## 2019-01-19 DIAGNOSIS — N183 Chronic kidney disease, stage 3 unspecified: Secondary | ICD-10-CM | POA: Diagnosis present

## 2019-01-19 DIAGNOSIS — Z885 Allergy status to narcotic agent status: Secondary | ICD-10-CM

## 2019-01-19 DIAGNOSIS — Z79890 Hormone replacement therapy (postmenopausal): Secondary | ICD-10-CM

## 2019-01-19 DIAGNOSIS — I25119 Atherosclerotic heart disease of native coronary artery with unspecified angina pectoris: Secondary | ICD-10-CM

## 2019-01-19 DIAGNOSIS — Z8673 Personal history of transient ischemic attack (TIA), and cerebral infarction without residual deficits: Secondary | ICD-10-CM

## 2019-01-19 DIAGNOSIS — Z8719 Personal history of other diseases of the digestive system: Secondary | ICD-10-CM

## 2019-01-19 LAB — CBC
HCT: 31.9 % — ABNORMAL LOW (ref 36.0–46.0)
Hemoglobin: 9.5 g/dL — ABNORMAL LOW (ref 12.0–15.0)
MCH: 20.8 pg — ABNORMAL LOW (ref 26.0–34.0)
MCHC: 29.8 g/dL — ABNORMAL LOW (ref 30.0–36.0)
MCV: 69.8 fL — ABNORMAL LOW (ref 80.0–100.0)
Platelets: 234 10*3/uL (ref 150–400)
RBC: 4.57 MIL/uL (ref 3.87–5.11)
RDW: 16.7 % — ABNORMAL HIGH (ref 11.5–15.5)
WBC: 10.4 10*3/uL (ref 4.0–10.5)
nRBC: 0 % (ref 0.0–0.2)

## 2019-01-19 LAB — BASIC METABOLIC PANEL
Anion gap: 11 (ref 5–15)
BUN: 17 mg/dL (ref 6–20)
CO2: 24 mmol/L (ref 22–32)
Calcium: 8.1 mg/dL — ABNORMAL LOW (ref 8.9–10.3)
Chloride: 105 mmol/L (ref 98–111)
Creatinine, Ser: 1.74 mg/dL — ABNORMAL HIGH (ref 0.44–1.00)
GFR calc Af Amer: 39 mL/min — ABNORMAL LOW (ref >60)
GFR calc non Af Amer: 34 mL/min — ABNORMAL LOW (ref >60)
Glucose, Bld: 94 mg/dL (ref 70–99)
Potassium: 3.1 mmol/L — ABNORMAL LOW (ref 3.5–5.1)
Sodium: 140 mmol/L (ref 135–145)

## 2019-01-19 LAB — TROPONIN I (HIGH SENSITIVITY)
Troponin I (High Sensitivity): 21 ng/L — ABNORMAL HIGH (ref <18)
Troponin I (High Sensitivity): 21 ng/L — ABNORMAL HIGH (ref <18)

## 2019-01-19 LAB — FIBRIN DERIVATIVES D-DIMER (ARMC ONLY): Fibrin derivatives D-dimer (ARMC): 739.81 ng/mL (FEU) — ABNORMAL HIGH (ref 0.00–499.00)

## 2019-01-19 LAB — RESPIRATORY PANEL BY RT PCR (FLU A&B, COVID)
Influenza A by PCR: NEGATIVE
Influenza B by PCR: NEGATIVE
SARS Coronavirus 2 by RT PCR: NEGATIVE

## 2019-01-19 MED ORDER — ASPIRIN 81 MG PO CHEW
324.0000 mg | CHEWABLE_TABLET | Freq: Once | ORAL | Status: AC
  Administered 2019-01-19: 324 mg via ORAL
  Filled 2019-01-19: qty 4

## 2019-01-19 MED ORDER — PANTOPRAZOLE SODIUM 40 MG PO TBEC
40.0000 mg | DELAYED_RELEASE_TABLET | Freq: Two times a day (BID) | ORAL | Status: DC
  Administered 2019-01-19 – 2019-01-23 (×8): 40 mg via ORAL
  Filled 2019-01-19 (×8): qty 1

## 2019-01-19 MED ORDER — SODIUM CHLORIDE 0.9 % IV BOLUS
1000.0000 mL | Freq: Once | INTRAVENOUS | Status: AC
  Administered 2019-01-19: 19:00:00 1000 mL via INTRAVENOUS

## 2019-01-19 MED ORDER — AMLODIPINE BESYLATE 5 MG PO TABS
5.0000 mg | ORAL_TABLET | Freq: Every day | ORAL | Status: DC
  Administered 2019-01-20: 09:00:00 5 mg via ORAL
  Filled 2019-01-19: qty 1

## 2019-01-19 MED ORDER — ZOLPIDEM TARTRATE 5 MG PO TABS
5.0000 mg | ORAL_TABLET | Freq: Every evening | ORAL | Status: DC | PRN
  Administered 2019-01-20 – 2019-01-22 (×3): 5 mg via ORAL
  Filled 2019-01-19 (×3): qty 1

## 2019-01-19 MED ORDER — MORPHINE SULFATE (PF) 2 MG/ML IV SOLN
2.0000 mg | INTRAVENOUS | Status: DC | PRN
  Administered 2019-01-19: 2 mg via INTRAVENOUS
  Filled 2019-01-19: qty 1

## 2019-01-19 MED ORDER — LOSARTAN POTASSIUM 25 MG PO TABS
25.0000 mg | ORAL_TABLET | Freq: Every day | ORAL | Status: DC
  Administered 2019-01-20 – 2019-01-22 (×3): 25 mg via ORAL
  Filled 2019-01-19 (×2): qty 1
  Filled 2019-01-19: qty 0.5
  Filled 2019-01-19: qty 1

## 2019-01-19 MED ORDER — BUPROPION HCL ER (XL) 150 MG PO TB24
150.0000 mg | ORAL_TABLET | Freq: Every day | ORAL | Status: DC
  Administered 2019-01-20 – 2019-01-23 (×4): 150 mg via ORAL
  Filled 2019-01-19 (×5): qty 1

## 2019-01-19 MED ORDER — METOPROLOL SUCCINATE ER 25 MG PO TB24
25.0000 mg | ORAL_TABLET | Freq: Every day | ORAL | Status: DC
  Administered 2019-01-20: 09:00:00 25 mg via ORAL
  Filled 2019-01-19: qty 1

## 2019-01-19 MED ORDER — ALPRAZOLAM 0.25 MG PO TABS
0.2500 mg | ORAL_TABLET | Freq: Two times a day (BID) | ORAL | Status: DC | PRN
  Administered 2019-01-20: 0.25 mg via ORAL
  Filled 2019-01-19: qty 1

## 2019-01-19 MED ORDER — VENLAFAXINE HCL ER 75 MG PO CP24
75.0000 mg | ORAL_CAPSULE | Freq: Every day | ORAL | Status: DC
  Administered 2019-01-20 – 2019-01-23 (×4): 75 mg via ORAL
  Filled 2019-01-19 (×5): qty 1

## 2019-01-19 MED ORDER — ONDANSETRON HCL 4 MG/2ML IJ SOLN: 4.0000 mg | Freq: Four times a day (QID) | INTRAMUSCULAR | Status: DC | PRN

## 2019-01-19 MED ORDER — ACETAMINOPHEN 325 MG PO TABS: 650.0000 mg | ORAL_TABLET | ORAL | Status: DC | PRN

## 2019-01-19 MED ORDER — ONDANSETRON HCL 4 MG/2ML IJ SOLN
4.0000 mg | Freq: Once | INTRAMUSCULAR | Status: AC
  Administered 2019-01-19: 4 mg via INTRAVENOUS
  Filled 2019-01-19: qty 2

## 2019-01-19 MED ORDER — ALUM & MAG HYDROXIDE-SIMETH 200-200-20 MG/5ML PO SUSP
30.0000 mL | Freq: Once | ORAL | Status: AC
  Administered 2019-01-19: 30 mL via ORAL
  Filled 2019-01-19: qty 30

## 2019-01-19 MED ORDER — SODIUM CHLORIDE 0.9 % IV SOLN: INTRAVENOUS | Status: DC

## 2019-01-19 MED ORDER — IOHEXOL 350 MG/ML SOLN
60.0000 mL | Freq: Once | INTRAVENOUS | Status: AC | PRN
  Administered 2019-01-19: 60 mL via INTRAVENOUS

## 2019-01-19 MED ORDER — HEPARIN (PORCINE) 25000 UT/250ML-% IV SOLN
850.0000 [IU]/h | INTRAVENOUS | Status: DC
  Administered 2019-01-19: 750 [IU]/h via INTRAVENOUS
  Filled 2019-01-19: qty 250

## 2019-01-19 MED ORDER — PROMETHAZINE HCL 25 MG PO TABS
25.0000 mg | ORAL_TABLET | Freq: Four times a day (QID) | ORAL | Status: DC | PRN
  Administered 2019-01-21 – 2019-01-22 (×2): 25 mg via ORAL
  Filled 2019-01-19 (×4): qty 1

## 2019-01-19 MED ORDER — LIDOCAINE VISCOUS HCL 2 % MT SOLN
15.0000 mL | Freq: Once | OROMUCOSAL | Status: AC
  Administered 2019-01-19: 22:00:00 15 mL via ORAL
  Filled 2019-01-19: qty 15

## 2019-01-19 MED ORDER — GABAPENTIN 300 MG PO CAPS
300.0000 mg | ORAL_CAPSULE | Freq: Two times a day (BID) | ORAL | Status: DC
  Administered 2019-01-20 – 2019-01-23 (×8): 300 mg via ORAL
  Filled 2019-01-19 (×8): qty 1

## 2019-01-19 MED ORDER — HEPARIN BOLUS VIA INFUSION
3900.0000 [IU] | Freq: Once | INTRAVENOUS | Status: AC
  Administered 2019-01-19: 3900 [IU] via INTRAVENOUS
  Filled 2019-01-19: qty 3900

## 2019-01-19 MED ORDER — NITROGLYCERIN 0.4 MG SL SUBL: 0.4000 mg | SUBLINGUAL_TABLET | SUBLINGUAL | Status: DC | PRN

## 2019-01-19 MED ORDER — ROSUVASTATIN CALCIUM 10 MG PO TABS
40.0000 mg | ORAL_TABLET | Freq: Every day | ORAL | Status: DC
  Administered 2019-01-20 – 2019-01-23 (×4): 40 mg via ORAL
  Filled 2019-01-19 (×2): qty 4
  Filled 2019-01-19: qty 2
  Filled 2019-01-19 (×2): qty 4

## 2019-01-19 MED ORDER — LEVOTHYROXINE SODIUM 25 MCG PO TABS
25.0000 ug | ORAL_TABLET | Freq: Every day | ORAL | Status: DC
  Administered 2019-01-20 – 2019-01-23 (×4): 25 ug via ORAL
  Filled 2019-01-19 (×4): qty 1

## 2019-01-19 MED ORDER — ISOSORBIDE MONONITRATE ER 30 MG PO TB24
30.0000 mg | ORAL_TABLET | Freq: Every day | ORAL | Status: DC
  Administered 2019-01-20 – 2019-01-21 (×2): 30 mg via ORAL
  Filled 2019-01-19 (×2): qty 1

## 2019-01-19 MED ORDER — CLOPIDOGREL BISULFATE 75 MG PO TABS
75.0000 mg | ORAL_TABLET | Freq: Every day | ORAL | Status: DC
  Administered 2019-01-20 – 2019-01-23 (×4): 75 mg via ORAL
  Filled 2019-01-19 (×4): qty 1

## 2019-01-19 MED ORDER — MORPHINE SULFATE (PF) 4 MG/ML IV SOLN
4.0000 mg | Freq: Once | INTRAVENOUS | Status: AC
  Administered 2019-01-19: 4 mg via INTRAVENOUS
  Filled 2019-01-19: qty 1

## 2019-01-19 NOTE — ED Notes (Signed)
Pt otf for imaging 

## 2019-01-19 NOTE — Telephone Encounter (Signed)
Pt c/o BP issue: STAT if pt c/o blurred vision, one-sided weakness or slurred speech  1. What are your last 5 BP readings?  12/17  139/88 88 12/16 208/104 12/16 199/96 12/16 174/89 12/16 184/96  2. Are you having any other symptoms (ex. Dizziness, headache, blurred vision, passed out)? SOB, chest pain since Tuesday, issues catching breath   3. What is your BP issue? Running high, has been taking isosorbide frequently to reduce BP  Patient did not want to go to hospital last night but wants to be advised on what to do, please call when able. [phone]

## 2019-01-19 NOTE — Telephone Encounter (Signed)
Spoke with patient. She report ongoing difficulty with catching her breath even when sitting up. She reports chest tightness/pressure "9" out of 10. Denies fever, cough, nausea, vomiting, sweating, arm or jaw pain. Discussed with Ignacia Bayley, NP who advised best for patient to proceed to ER with her reported symptoms. Patient verbalized understanding and will do so.

## 2019-01-19 NOTE — ED Notes (Signed)
Pt speaking with this RN in NAD, A&Ox4, reports pressure on left side of chest since Tues associated with SHOB.

## 2019-01-19 NOTE — Telephone Encounter (Signed)
Please call patient and advise her that [facility] is trying to get in touch with her to schedule an appointment. She needs to call [phone] to schedule

## 2019-01-19 NOTE — ED Notes (Signed)
Light green and blue tubes sent to lab

## 2019-01-19 NOTE — Telephone Encounter (Signed)
Call placed to patient. See other encounter from today for details.

## 2019-01-19 NOTE — ED Provider Notes (Signed)
[facility] Emergency Department Provider Note  ____________________________________________   First MD Initiated Contact with Patient 01/19/19 1716     (approximate)  I have reviewed the triage vital signs and the nursing notes.   HISTORY  Chief Complaint Chest Pain and Shortness of Breath    HPI [name] is a 48 y.o. female with hypertension, hyperlipidemia, stroke, coronary disease who comes in with chest pain and shortness of breath.  Patient states she is had chest pain for the past 2 days at rest.  She states that the pain has been intermittent at first but now becoming more constant over the past 1 day.  The pain is in the center of her chest, nonradiating, severe, nothing makes it better, nothing makes it worse.  She says that this feels similar to her prior chest pain episodes although may be get somewhat worse.  She also has had some shortness of breath which is also new for her.  She denies any abdominal pain, fevers, cough.  Patient was last admitted for chest pain on 12/2017.  Patient had negative cardiac markers but was admitted for concern for angina at rest she went underwent balloon angioplasty again.          Past Medical History:  Diagnosis Date  . Acute pyelonephritis   . Anemia    iron deficiency anemia  . Aortic arch aneurysm ([place])   . Bipolar disorder ([place])   . BRCA negative 2014  . CAD (coronary artery disease)    a. 08/2003 Cath: LAD 30-40-med Rx; b. 11/2014 PCI: LAD 70m(3.25x23 Xience Alpine DES); c. 06/2015 PCI: D1 (2.25x12 Resolute Integrity DES); d. 06/2017 PCI: Patent mLAD stent, D2 95 (PTCA); e. 09/2017 PCI: D2 99ost (CBA); d. 12/2017 Cath: LM nl, LAD 3110m80d (small), D1 40ost, D2 95ost, LCX 40p, RCA 40ost/p->Med rx for D2 given restenosis.  . CKD (chronic kidney disease), stage III   . Colon polyp   . CVA (cerebral vascular accident) (HCAshkum   Left side weakness.   . Diabetes (HCCheraw  . Family history of  breast cancer    BRCA neg 2014  . Gastric ulcer 04/27/2011  . History of echocardiogram    a. 03/2017 Echo: EF 60-65%, no rwma; b. 02/2018 Echo: EF 60-65%, no rwma. Nl RV fxn. No cardiac source of emboli (admitted w/ stroke).  . [place] (hypertension)   . Hyperlipemia   . Hypothyroid   . Malignant melanoma of skin of scalp (HCYork  . MI, acute, non ST segment elevation (HCAlpine Village  . Neuromuscular disorder (HCDonaldsonville  . Orthostatic hypotension   . S/P drug eluting coronary stent placement 06/04/2015  . Sepsis (HCLake City2/14/2019  . [facility] ([facility]   a. 02/2018 MRI: 80m42mate acute/early subacute L medial frontal lobe inarct; b. 02/2018 MRA No large vessel occlusion or aneurysm. Mod to sev L P2 stenosis. thready L vertebral artery, diffusely dzs'd; c. 02/2018 Carotid U/S: <50% bilat ICA dzs.    Patient Active Problem List   Diagnosis Date Noted  . Depression, major, single episode, moderate (HCCTalbotton1/07/2018  . Fall   . Closed nondisplaced intertrochanteric fracture of left femur (HCCFrenchtown9/10/2018  . Femur fracture, left (HCCMenasha9/10/2018  . Hypotension 06/26/2018  . Autonomic neuropathy 03/24/2018  . Acute delirium 03/03/2018  . H/O gastric bypass 03/02/2018  . Hypertension associated with stage 3 chronic kidney disease due to type 2 diabetes mellitus (HCCPierre1/19/2020  . SI (sacroiliac)  joint dysfunction 12/02/2017  . MI, acute, non ST segment elevation ([place])   . Effort angina ([place]) 06/29/2017  . Unstable angina ([place]) 06/24/2017  . Syncope 04/09/2017  . Insomnia 03/18/2017  . Ischemic cardiomyopathy   . Arthritis   . Anxiety   . Tendinitis of upper biceps tendon of right shoulder 03/16/2017  . Degenerative tear of glenoid labrum of right shoulder 03/15/2017  . Acute colitis 01/27/2017  . Hx of colonic polyps   . H/O medication noncompliance 12/14/2015  . Emesis   . Atherosclerosis of native coronary artery of native heart with stable angina pectoris ([place])   . Hypertensive heart disease   . CKD  (chronic kidney disease), stage III ([place]) 06/05/2015  . Status post bariatric surgery 06/04/2015  . Colitis 06/03/2015  . Carotid stenosis 04/30/2015  . Type 2 diabetes mellitus with stage 3 chronic kidney disease, without long-term current use of insulin ([place])   . Stable angina pectoris ([place]) 04/17/2015  . Iron deficiency anemia 03/22/2015  . Vitamin B12 deficiency 02/18/2015  . Misuse of medications for pain 02/18/2015  . Major depressive disorder, recurrent, severe with psychotic features ([place]) 02/15/2015  . Helicobacter pylori infection 11/23/2014  . Hemiparesis, left ([place]) 11/23/2014  . Benign neoplasm of colon 11/20/2014  . Malignant melanoma ([place]) 08/25/2014  . Chronic systolic CHF (congestive heart failure) ([place])   . Incomplete bladder emptying 07/12/2014  . Hypothyroidism 12/30/2013  . Aberrant subclavian artery 11/17/2013  . Multiple sclerosis ([place]) 11/02/2013  . History of CVA with residual deficit 06/20/2013  . Headache, migraine 05/29/2013  . Hyperlipidemia   . GERD (gastroesophageal reflux disease)   . Neuropathy ([place]) 01/02/2011  . Stroke ([place]) 06/21/2008  . Depression with anxiety 05/01/2008  . Essential hypertension 05/01/2008    Past Surgical History:  Procedure Laterality Date  . APPENDECTOMY    . CARDIAC CATHETERIZATION N/A 11/09/2014   Procedure: Coronary Angiography;  Surgeon: Minna Merritts, MD;  Location: [place] [facility];  Service: Cardiovascular;  Laterality: N/A;  . CARDIAC CATHETERIZATION N/A 11/12/2014   Procedure: Coronary Stent Intervention;  Surgeon: Isaias Cowman, MD;  Location: [place] [facility];  Service: Cardiovascular;  Laterality: N/A;  . CARDIAC CATHETERIZATION N/A 04/18/2015   Procedure: Left Heart Cath and Coronary Angiography;  Surgeon: Minna Merritts, MD;  Location: [place] [facility];  Service: Cardiovascular;  Laterality: N/A;  . CARDIAC CATHETERIZATION Left 06/04/2015   Procedure: Left Heart Cath and Coronary  Angiography;  Surgeon: Wellington Hampshire, MD;  Location: [place] [facility];  Service: Cardiovascular;  Laterality: Left;  . CARDIAC CATHETERIZATION N/A 06/04/2015   Procedure: Coronary Stent Intervention;  Surgeon: Wellington Hampshire, MD;  Location: [place] [facility];  Service: Cardiovascular;  Laterality: N/A;  . CESAREAN SECTION  2001  . CHOLECYSTECTOMY N/A 11/18/2016   Procedure: LAPAROSCOPIC CHOLECYSTECTOMY WITH INTRAOPERATIVE CHOLANGIOGRAM;  Surgeon: Christene Lye, MD;  Location: [facility];  Service: General;  Laterality: N/A;  . COLONOSCOPY WITH PROPOFOL N/A 04/27/2016   Procedure: COLONOSCOPY WITH PROPOFOL;  Surgeon: [name], MD;  Location: [place];  Service: Endoscopy;  Laterality: N/A;  . COLONOSCOPY WITH PROPOFOL N/A 01/12/2018   Procedure: COLONOSCOPY WITH PROPOFOL;  Surgeon: Toledo, Benay Pike, MD;  Location: [facility];  Service: Endoscopy;  Laterality: N/A;  . CORONARY ANGIOPLASTY    . CORONARY BALLOON ANGIOPLASTY N/A 06/29/2017   Procedure: CORONARY BALLOON ANGIOPLASTY;  Surgeon: Wellington Hampshire, MD;  Location: [place] [facility];  Service: Cardiovascular;  Laterality:  N/A;  . CORONARY BALLOON ANGIOPLASTY N/A 09/20/2017   Procedure: CORONARY BALLOON ANGIOPLASTY;  Surgeon: Wellington Hampshire, MD;  Location: [place] [facility];  Service: Cardiovascular;  Laterality: N/A;  . DILATION AND CURETTAGE OF UTERUS    . ESOPHAGOGASTRODUODENOSCOPY (EGD) WITH PROPOFOL N/A 09/14/2014   Procedure: ESOPHAGOGASTRODUODENOSCOPY (EGD) WITH PROPOFOL;  Surgeon: Josefine Class, MD;  Location: [facility];  Service: Endoscopy;  Laterality: N/A;  . ESOPHAGOGASTRODUODENOSCOPY (EGD) WITH PROPOFOL N/A 04/27/2016   Procedure: ESOPHAGOGASTRODUODENOSCOPY (EGD) WITH PROPOFOL;  Surgeon: [name], MD;  Location: [place];  Service: Endoscopy;  Laterality: N/A;  Diabetic - oral meds  . ESOPHAGOGASTRODUODENOSCOPY (EGD) WITH PROPOFOL N/A 01/12/2018   Procedure:  ESOPHAGOGASTRODUODENOSCOPY (EGD) WITH PROPOFOL;  Surgeon: [place], Benay Pike, MD;  Location: [facility];  Service: Endoscopy;  Laterality: N/A;  . GASTRIC BYPASS  09/2009   [place] (IM) NAIL INTERTROCHANTERIC Left 10/13/2018   Procedure: INTRAMEDULLARY (IM) NAIL INTERTROCHANTRIC;  Surgeon: Leandrew Koyanagi, MD;  Location: [place];  Service: Orthopedics;  Laterality: Left;  . Left Carotid to sublcavian artery bypass w/ subclavian artery ligation     a. Performed @ [place].  . LEFT HEART CATH AND CORONARY ANGIOGRAPHY Left 06/29/2017   Procedure: LEFT HEART CATH AND CORONARY ANGIOGRAPHY;  Surgeon: Wellington Hampshire, MD;  Location: [place] [facility];  Service: Cardiovascular;  Laterality: Left;  . LEFT HEART CATH AND CORONARY ANGIOGRAPHY N/A 09/20/2017   Procedure: LEFT HEART CATH AND CORONARY ANGIOGRAPHY;  Surgeon: Wellington Hampshire, MD;  Location: [place] [facility];  Service: Cardiovascular;  Laterality: N/A;  . LEFT HEART CATH AND CORONARY ANGIOGRAPHY N/A 12/20/2017   Procedure: LEFT HEART CATH AND CORONARY ANGIOGRAPHY;  Surgeon: Wellington Hampshire, MD;  Location: [place] [facility];  Service: Cardiovascular;  Laterality: N/A;  . MELANOMA EXCISION  2016   Dr. Evorn Gong  . [place]  2002  . RIGHT OOPHORECTOMY    . SHOULDER ARTHROSCOPY WITH OPEN ROTATOR CUFF REPAIR Right 01/07/2016   Procedure: SHOULDER ARTHROSCOPY WITH DEBRIDMENT, SUBACHROMIAL DECOMPRESSION;  Surgeon: Corky Mull, MD;  Location: [facility];  Service: Orthopedics;  Laterality: Right;  . SHOULDER ARTHROSCOPY WITH OPEN ROTATOR CUFF REPAIR Right 03/16/2017   Procedure: SHOULDER ARTHROSCOPY WITH OPEN ROTATOR CUFF REPAIR POSSIBLE BICEPS TENODESIS;  Surgeon: Corky Mull, MD;  Location: [facility];  Service: Orthopedics;  Laterality: Right;  . TRIGGER FINGER RELEASE Right     [place]    Prior to Admission medications   Medication Sig Start Date End Date Taking? Authorizing Provider    ALPRAZolam Duanne Moron) 1 MG tablet TAKE 1 TABLET BY MOUTH ONCE OR TWICE A DAY FOR ANXIETY 12/09/18   [name], MD  amLODipine (NORVASC) 5 MG tablet Take 5 mg by mouth daily.  12/01/18 12/01/19  [provider]  Blood Glucose Monitoring Suppl (ONE TOUCH ULTRA 2) w/Device KIT Use to check blood sugar once a day. Dx. E11.9 03/18/17   [name], MD  buPROPion (WELLBUTRIN XL) 150 MG 24 hr tablet TAKE 1 TABLET BY MOUTH EVERY DAY Patient taking differently: Take 150 mg by mouth daily.  09/27/18   [name], MD  clopidogrel (PLAVIX) 75 MG tablet Take 1 tablet (75 mg total) by mouth daily. 07/08/18   Theora Gianotti, NP  diphenoxylate-atropine (LOMOTIL) 2.5-0.025 MG tablet Take one capsule 1-4 times daily as needed for diarrhea 01/10/19   [name], MD  gabapentin (NEURONTIN) 300 MG  capsule Take 1 capsule (300 mg total) by mouth 2 (two) times daily. Patient not taking: Reported on 12/27/2018 10/12/18   [name], MD  Galcanezumab-gnlm (EMGALITY) 120 MG/ML SOAJ INJECT 240 MG SUBCUTANEOUSLY AS DIRECTED FOR THE FIRST MONTH. 11/11/18   [provider]  ibuprofen (ADVIL) 800 MG tablet Take 1 tablet (800 mg total) by mouth 2 (two) times daily as needed. 10/27/18   Leandrew Koyanagi, MD  isosorbide mononitrate (IMDUR) 30 MG 24 hr tablet Take 1 tablet (30 mg total) by mouth daily. 09/15/18   Minna Merritts, MD  levothyroxine (SYNTHROID, LEVOTHROID) 25 MCG tablet Take 1 tablet (25 mcg total) by mouth daily before breakfast. 03/28/18   [name], MD  losartan (COZAAR) 25 MG tablet Take 25 mg by mouth daily.    [provider]  metFORMIN (GLUCOPHAGE-XR) 500 MG 24 hr tablet TAKE 1 TABLET BY MOUTH ONCE DAILY WITH DINNER 11/02/18   [provider]  metoprolol succinate (TOPROL XL) 25 MG 24 hr tablet Take 1 tablet (25 mg total) by mouth daily. 12/27/18   Minna Merritts, MD  nitroGLYCERIN (NITROSTAT) 0.4 MG SL tablet Place 1 tablet (0.4 mg total)  under the tongue every 5 (five) minutes as needed for chest pain. 06/02/17   Minna Merritts, MD  pantoprazole (PROTONIX) 40 MG tablet Take 1 tablet (40 mg total) by mouth 2 (two) times daily. 03/02/18   [name], MD  promethazine (PHENERGAN) 25 MG tablet Take 1 tablet (25 mg total) by mouth every 6 (six) hours as needed for nausea or vomiting. 11/07/18   [name], MD  rosuvastatin (CRESTOR) 40 MG tablet Take 1 tablet (40 mg total) by mouth daily. 10/12/18   [name], MD  venlafaxine XR (EFFEXOR-XR) 75 MG 24 hr capsule Take 1 capsule (75 mg total) by mouth daily with breakfast. 09/27/18   [name], MD    Allergies Lipitor [atorvastatin], Tramadol, and Lactose intolerance (gi)  Family History  Problem Relation Age of Onset  . Hypertension Mother   . Anxiety disorder Mother   . Depression Mother   . Bipolar disorder Mother   . Heart disease Mother        No details  . Hyperlipidemia Mother   . Kidney disease Father   . Heart disease Father 52  . Hypertension Father   . Diabetes Father   . Stroke Father   . Colon cancer Father        dx in his 56's  . Anxiety disorder Father   . Depression Father   . Skin cancer Father   . Kidney disease Sister   . Thyroid nodules Sister   . Hypertension Sister   . Hypertension Sister   . Diabetes Sister   . Hyperlipidemia Sister   . Depression Sister   . Breast cancer Maternal Aunt 7  . Breast cancer Maternal Aunt 56  . Ovarian cancer Cousin   . Colon cancer Cousin   . Breast cancer Other   . Kidney cancer Neg Hx   . Bladder Cancer Neg Hx     Social History Social History   Tobacco Use  . Smoking status: Former Smoker    Types: Cigarettes    Quit date: 08/31/1994    Years since quitting: 24.4  . Smokeless tobacco: Never Used  . Tobacco comment: quit 28 years ago  Substance Use Topics  . Alcohol use: No    Alcohol/week: 0.0 standard drinks  .  Drug use: No      Review of Systems Constitutional: No  fever/chills Eyes: No visual changes. ENT: No sore throat. Cardiovascular: Positive chest pain Respiratory: + shortness of breath. Gastrointestinal: No abdominal pain.  No nausea, no vomiting.  No diarrhea.  No constipation. Genitourinary: Negative for dysuria. Musculoskeletal: Negative for back pain. Skin: Negative for rash. Neurological: Negative for headaches, focal weakness or numbness. All other ROS negative ____________________________________________   PHYSICAL EXAM:  VITAL SIGNS: ED Triage Vitals  Enc Vitals Group     BP 01/19/19 1533 (!) 163/92     Pulse Rate 01/19/19 1533 79     Resp 01/19/19 1533 18     Temp 01/19/19 1533 98.3 F (36.8 C)     Temp Source 01/19/19 1533 Oral     SpO2 01/19/19 1533 100 %     Weight 01/19/19 1517 160 lb (72.6 kg)     Height 01/19/19 1517 '5\' 2"'  (1.575 m)     Head Circumference --      Peak Flow --      Pain Score 01/19/19 1517 10     Pain Loc --      Pain Edu? --      Excl. in [place]? --     Constitutional: Alert and oriented. Well appearing and in no acute distress. Eyes: Conjunctivae are normal. EOMI. Head: Atraumatic. Nose: No congestion/rhinnorhea. Mouth/Throat: Mucous membranes are moist.   Neck: No stridor. Trachea Midline. FROM Cardiovascular: Normal rate, regular rhythm. Grossly normal heart sounds.  Good peripheral circulation. Respiratory: Normal respiratory effort.  No retractions. Lungs CTAB. Gastrointestinal: Soft and nontender. No distention. No abdominal bruits.  Musculoskeletal: No lower extremity tenderness nor edema.  No joint effusions. Neurologic:  Normal speech and language. No gross focal neurologic deficits are appreciated.  Skin:  Skin is warm, dry and intact. No rash noted. Psychiatric: Mood and affect are normal. Speech and behavior are normal. GU: Deferred   ____________________________________________   LABS (all labs ordered are listed, but only abnormal results are displayed)  Labs Reviewed    BASIC METABOLIC PANEL - Abnormal; Notable for the following components:      Result Value   Potassium 3.1 (*)    Creatinine, Ser 1.74 (*)    Calcium 8.1 (*)    GFR calc non Af Amer 34 (*)    GFR calc Af Amer 39 (*)    All other components within normal limits  CBC - Abnormal; Notable for the following components:   Hemoglobin 9.5 (*)    HCT 31.9 (*)    MCV 69.8 (*)    MCH 20.8 (*)    MCHC 29.8 (*)    RDW 16.7 (*)    All other components within normal limits  FIBRIN DERIVATIVES D-DIMER ([facility] ONLY) - Abnormal; Notable for the following components:   Fibrin derivatives D-dimer ([facility]) 739.81 (*)    All other components within normal limits  TROPONIN I (HIGH SENSITIVITY) - Abnormal; Notable for the following components:   Troponin I (High Sensitivity) 21 (*)    All other components within normal limits  TROPONIN I (HIGH SENSITIVITY) - Abnormal; Notable for the following components:   Troponin I (High Sensitivity) 21 (*)    All other components within normal limits  RESPIRATORY PANEL BY RT PCR (FLU A&B, COVID)   ____________________________________________   ED ECG REPORT I, Vanessa [place], the attending physician, personally viewed and interpreted this ECG.  EKG normal sinus rate of 80, no st elevation, twave  in V5, V6, III, normal intervals  T wave inversions similar to prior EKG.    ___________________________________________  [place], personally viewed and evaluated these images (plain radiographs) as part of my medical decision making, as well as reviewing the written report by the radiologist.  ED MD interpretation:  Negative PNA   Official radiology report(s): DG Chest 2 View  Result Date: 01/19/2019 CLINICAL DATA:  Pt c/o CP since Tuesday that is pressure like in nature and some SOB. Pt states called her MD and was advised to come to the ED. Hx of CHF, stage 3 kidney failure. EXAM: CHEST - 2 VIEW COMPARISON:  10/12/2018 FINDINGS: Cardiac silhouette  is normal in size. No mediastinal or hilar masses. No evidence of adenopathy. Clear lungs.  No pleural effusion or pneumothorax. Skeletal structures are intact. IMPRESSION: No active cardiopulmonary disease. Electronically Signed   By: Lajean Manes M.D.   On: 01/19/2019 15:50    ____________________________________________   PROCEDURES  Procedure(s) performed (including Critical Care):  .Critical Care Performed by: Vanessa [place], MD Authorized by: Vanessa [place], MD   Critical care provider statement:    Critical care time (minutes):  31   Critical care was necessary to treat or prevent imminent or life-threatening deterioration of the following conditions: Unstable angina requiring heparin.   Critical care was time spent personally by me on the following activities:  Discussions with consultants, evaluation of patient's response to treatment, examination of patient, ordering and performing treatments and interventions, ordering and review of laboratory studies, ordering and review of radiographic studies, pulse oximetry, re-evaluation of patient's condition, obtaining history from patient or surrogate and review of old charts     ____________________________________________   INITIAL IMPRESSION / [place] / ED COURSE   [name] was evaluated in Emergency Department on 01/19/2019 for the symptoms described in the history of present illness. She was evaluated in the context of the global COVID-19 pandemic, which necessitated consideration that the patient might be at risk for infection with the SARS-CoV-2 virus that causes COVID-19. Institutional protocols and algorithms that pertain to the evaluation of patients at risk for COVID-19 are in a state of rapid change based on information released by regulatory bodies including the CDC and federal and state organizations. These policies and algorithms were followed during the patient's care in the ED.    Most Likely  DDx:  -Most concerning for unstable angina given her prior history of this.  DDx that was also considered d/t potential to cause harm, but was found less likely based on history and physical (as detailed above): -PNA (no fevers, cough but CXR to evaluate) -PNX (reassured with equal b/l breath sounds, CXR to evaluate) -Symptomatic anemia (will get H&H) -Pulmonary embolism recent hip surgery and shortness of breath will get D-dimer -Aortic Dissection as no tearing pain and no radiation to the mid back, pulses equal -Pericarditis no rub on exam, EKG changes or hx to suggest dx -Tamponade (no notable SOB, tachycardic, hypotensive) -Esophageal rupture (no h/o diffuse vomitting/no crepitus)   Patient's kidney function is around her baseline at 1.7.  Patient's hemoglobin is also around her baseline at 9.5.  Troponin slightly elevated but 21.  D-dimer slightly elevated so PE was done which was negative.  Troponin is stable but patient continues to have chest pain and given her history of unstable angina in the past requiring angioplasty I think would be best to start patient on heparin and admit her  to see cardiology. Patient denies any history of bleeding issues in the past and her hemoglobin is around her baseline.   ____________________________________________   FINAL CLINICAL IMPRESSION(S) / ED DIAGNOSES   Final diagnoses:  Unstable angina (HCC)     MEDICATIONS GIVEN DURING THIS VISIT:  Medications  aspirin chewable tablet 324 mg (324 mg Oral Given 01/19/19 1820)  morphine 4 MG/ML injection 4 mg (4 mg Intravenous Given 01/19/19 1821)  ondansetron (ZOFRAN) injection 4 mg (4 mg Intravenous Given 01/19/19 1821)  sodium chloride 0.9 % bolus 1,000 mL (1,000 mLs Intravenous New Bag/Given 01/19/19 1841)  iohexol (OMNIPAQUE) 350 MG/ML injection 60 mL (60 mLs Intravenous Contrast Given 01/19/19 1855)     ED Discharge Orders    None       Note:  This document was prepared using  Dragon voice recognition software and may include unintentional dictation errors.   Vanessa [place], MD 01/19/19 [id number]

## 2019-01-19 NOTE — ED Triage Notes (Signed)
Pt c/o CP since Tuesday that is pressure like in nature and some SOB. Pt states called her MD and was advised to come to the ED.

## 2019-01-19 NOTE — Consult Note (Signed)
[place] for Heparin  Indication: unstable angina  Allergies  Allergen Reactions  . Lipitor [Atorvastatin] Other (See Comments)    Leg pains  . Tramadol Other (See Comments)    Mouth feels like it's on fire  . Lactose Intolerance (Gi) Nausea Only    Patient Measurements: Height: 5\' 2"  (157.5 cm) Weight: 160 lb (72.6 kg) IBW/kg (Calculated) : 50.1 Heparin Dosing Weight: 65.6 kg   Vital Signs: Temp: 98.3 F (36.8 C) (12/17 1533) Temp Source: Oral (12/17 1533) BP: 150/90 (12/17 1830) Pulse Rate: 68 (12/17 1830)  Labs: Recent Labs    01/19/19 1537 01/19/19 1732  HGB 9.5*  --   HCT 31.9*  --   PLT 234  --   CREATININE 1.74*  --   TROPONINIHS 21* 21*    Estimated Creatinine Clearance: 36.1 mL/min (A) (by C-G formula based on SCr of 1.74 mg/dL (H)).   Medications:  Confirmed no PTA anticoagulants  Assessment: Pharmacy consulted for heparin dosing in a patient who presented with unstable angina.  Goal of Therapy:  Heparin level 0.3-0.7 units/ml Monitor platelets by anticoagulation protocol: Yes   Plan:  Baseline labs have been ordered  Heparin DW: 65.6 kg  Give 3900 units bolus x 1 Start heparin infusion at 750 units/hr Check anti-Xa level in 6 hours and daily while on heparin, per protocol Continue to monitor H&H and platelets   Madinah Quarry R Yatzari Jonsson 01/19/2019,8:03 PM

## 2019-01-19 NOTE — ED Notes (Signed)
Pt given [place] sandwich tray and coke, pt to be NPO after midnight but ok to eat now per Dr. Sidney Ace

## 2019-01-19 NOTE — H&P (Signed)
[facility] [place] NAME: [name]    MR#:  [id number]  DATE OF BIRTH:  02-20-1970  DATE OF ADMISSION:  01/19/2019  PRIMARY CARE PHYSICIAN: [name], MD   REQUESTING/REFERRING PHYSICIAN: Marjean Donna, MD CHIEF COMPLAINT:   Chief Complaint  Patient presents with  . Chest Pain  . Shortness of Breath    HISTORY OF PRESENT ILLNESS:  Sutcliffe  is a 48 y.o. [name] with a known history of coronary artery disease status post PCI and DES, type 2 diabetes mellitus, hypertension, hypothyroidism, dyslipidemia and bipolar disorder, presented to the emergency room with acute onset of midsternal chest tightness graded 10/10 in severity with mild associated dyspnea and palpitations without nausea or vomiting or diaphoresis.  The patient denied any cough or wheezing or hemoptysis.  No bleeding diathesis.  She is taking her aspirin and Plavix.  She denies any leg pain or edema recent travels or sick exposure.  She had a hip surgery in September of this year at [facility] that was apparently ORIF.  Upon presentation to the emergency room, blood pressure was 163/92 and otherwise vital signs were within normal.  Labs revealed hypokalemia of 3.1 and creatinine 1.74 slightly better than previous level on 11/04/2018 (1.89 then).  Troponin I was 21 and later 21 and CBC showed anemia with hemoglobin 9.5 hematocrit 31.9 better than previous levels.  Fibrin derivatives D-dimer was 729.81.  Portable chest ray showed no acute cardiopulmonary disease and chest CTA revealed no evidence for PE.  It showed right sided aortic arch with aberrant left subclavian artery which has been embolized and bypass graft from the left common carotid artery to the left subclavian artery with grossly patent left subclavian artery around the level of anastomosis.  Overall this is stable since 2017.  It showed aortic atherosclerosis.  EKG showed normal sinus rhythm with rate of 80 with  sagging ST segment laterally and inverted T wave inferiorly.  The patient was given 40 aspirin, 4 mg IV morphine sulfate and 4 mg IV Zofran as well as 1 L bolus of IV normal saline and was started on IV heparin bolus and drip.  She will be admitted to an observation telemetry bed for further evaluation and monitoring. PAST MEDICAL HISTORY:   Past Medical History:  Diagnosis Date  . Acute pyelonephritis   . Anemia    iron deficiency anemia  . Aortic arch aneurysm ([place])   . Bipolar disorder ([place])   . BRCA negative 2014  . CAD (coronary artery disease)    a. 08/2003 Cath: LAD 30-40-med Rx; b. 11/2014 PCI: LAD 71m(3.25x23 Xience Alpine DES); c. 06/2015 PCI: D1 (2.25x12 Resolute Integrity DES); d. 06/2017 PCI: Patent mLAD stent, D2 95 (PTCA); e. 09/2017 PCI: D2 99ost (CBA); d. 12/2017 Cath: LM nl, LAD 387m80d (small), D1 40ost, D2 95ost, LCX 40p, RCA 40ost/p->Med rx for D2 given restenosis.  . CKD (chronic kidney disease), stage III   . Colon polyp   . CVA (cerebral vascular accident) ([place]   Left side weakness.   . Diabetes ([place]  . Family history of breast cancer    BRCA neg 2014  . Gastric ulcer 04/27/2011  . History of echocardiogram    a. 03/2017 Echo: EF 60-65%, no rwma; b. 02/2018 Echo: EF 60-65%, no rwma. Nl RV fxn. No cardiac source of emboli (admitted w/ stroke).  . [place] (hypertension)   . Hyperlipemia   .  Hypothyroid   . Malignant melanoma of skin of scalp ([place])   . MI, acute, non ST segment elevation ([place])   . Neuromuscular disorder ([place])   . Orthostatic hypotension   . S/P drug eluting coronary stent placement 06/04/2015  . Sepsis ([place]) 03/18/2017  . Stroke [facility])    a. 02/2018 MRI: 17m late acute/early subacute L medial frontal lobe inarct; b. 02/2018 MRA No large vessel occlusion or aneurysm. Mod to sev L P2 stenosis. thready L vertebral artery, diffusely dzs'd; c. 02/2018 Carotid U/S: <50% bilat ICA dzs.    PAST SURGICAL HISTORY:   Past Surgical History:  Procedure  Laterality Date  . APPENDECTOMY    . CARDIAC CATHETERIZATION N/A 11/09/2014   Procedure: Coronary Angiography;  Surgeon: TMinna Merritts MD;  [place] [place];  Service: Cardiovascular;  Laterality: N/A;  . CARDIAC CATHETERIZATION N/A 11/12/2014   Procedure: Coronary Stent Intervention;  Surgeon: AIsaias Cowman MD;  [place] [facility];  Service: Cardiovascular;  Laterality: N/A;  . CARDIAC CATHETERIZATION N/A 04/18/2015   Procedure: Left Heart Cath and Coronary Angiography;  Surgeon: TMinna Merritts MD;  [place];  Service: Cardiovascular;  Laterality: N/A;  . CARDIAC CATHETERIZATION Left 06/04/2015   Procedure: Left Heart Cath and Coronary Angiography;  Surgeon: MWellington Hampshire MD;  [place] [facility];  Service: Cardiovascular;  Laterality: Left;  . CARDIAC CATHETERIZATION N/A 06/04/2015   Procedure: Coronary Stent Intervention;  Surgeon: MWellington Hampshire MD;  [place] [facility];  Service: Cardiovascular;  Laterality: N/A;  . CESAREAN SECTION  2001  . CHOLECYSTECTOMY N/A 11/18/2016   Procedure: LAPAROSCOPIC CHOLECYSTECTOMY WITH INTRAOPERATIVE CHOLANGIOGRAM;  Surgeon: SChristene Lye MD;  Location: [facility] OR[facility];  Service: General;  Laterality: N/A;  . COLONOSCOPY WITH PROPOFOL N/A 04/27/2016   Procedure: COLONOSCOPY WITH PROPOFOL;  Surgeon: DLucilla Lame MD;  [place]  Service: Endoscopy;  Laterality: N/A;  . COLONOSCOPY WITH PROPOFOL N/A 01/12/2018   Procedure: COLONOSCOPY WITH PROPOFOL;  Surgeon: Toledo, TBenay Pike MD;  Location: [facility] ENDOSCOPY;  Service: Endoscopy;  Laterality: N/A;  . CORONARY ANGIOPLASTY    . CORONARY BALLOON ANGIOPLASTY N/A 06/29/2017   Procedure: CORONARY BALLOON ANGIOPLASTY;  Surgeon: AWellington Hampshire MD;  [place] [facility];  Service: Cardiovascular;  Laterality: N/A;  . CORONARY BALLOON ANGIOPLASTY N/A 09/20/2017   Procedure: CORONARY BALLOON  ANGIOPLASTY;  Surgeon: AWellington Hampshire MD;  [place] [facility];  Service: Cardiovascular;  Laterality: N/A;  . DILATION AND CURETTAGE OF UTERUS    . ESOPHAGOGASTRODUODENOSCOPY (EGD) WITH PROPOFOL N/A 09/14/2014   Procedure: ESOPHAGOGASTRODUODENOSCOPY (EGD) WITH PROPOFOL;  Surgeon: MJosefine Class MD;  [facility];  Service: Endoscopy;  Laterality: N/A;  . ESOPHAGOGASTRODUODENOSCOPY (EGD) WITH PROPOFOL N/A 04/27/2016   Procedure: ESOPHAGOGASTRODUODENOSCOPY (EGD) WITH PROPOFOL;  Surgeon: DLucilla Lame MD;  [place]  Service: Endoscopy;  Laterality: N/A;  Diabetic - oral meds  . ESOPHAGOGASTRODUODENOSCOPY (EGD) WITH PROPOFOL N/A 01/12/2018   Procedure: ESOPHAGOGASTRODUODENOSCOPY (EGD) WITH PROPOFOL;  Surgeon: Toledo, TBenay Pike MD;  Location: [facility];  Service: Endoscopy;  Laterality: N/A;  . GASTRIC BYPASS  09/2009   WHays(IM) NAIL INTERTROCHANTERIC Left 10/13/2018   Procedure: INTRAMEDULLARY (IM) NAIL INTERTROCHANTRIC;  Surgeon: XLeandrew Koyanagi MD;  [place]  Service: Orthopedics;  Laterality: Left;  . Left Carotid to sublcavian artery bypass w/ subclavian artery ligation     a.  Performed @ [place].  . LEFT HEART CATH AND CORONARY ANGIOGRAPHY Left 06/29/2017   Procedure: LEFT HEART CATH AND CORONARY ANGIOGRAPHY;  Surgeon: Wellington Hampshire, MD;  Location: [place] CV LAB;  Service: Cardiovascular;  Laterality: Left;  . LEFT HEART CATH AND CORONARY ANGIOGRAPHY N/A 09/20/2017   Procedure: LEFT HEART CATH AND CORONARY ANGIOGRAPHY;  Surgeon: Wellington Hampshire, MD;  Location: [place] CV LAB;  Service: Cardiovascular;  Laterality: N/A;  . LEFT HEART CATH AND CORONARY ANGIOGRAPHY N/A 12/20/2017   Procedure: LEFT HEART CATH AND CORONARY ANGIOGRAPHY;  Surgeon: Wellington Hampshire, MD;  Location: [place] [facility] LAB;  Service: Cardiovascular;  Laterality: N/A;  . MELANOMA EXCISION  2016   Dr.  Evorn Gong  . [place]  2002  . RIGHT OOPHORECTOMY    . SHOULDER ARTHROSCOPY WITH OPEN ROTATOR CUFF REPAIR Right 01/07/2016   Procedure: SHOULDER ARTHROSCOPY WITH DEBRIDMENT, SUBACHROMIAL DECOMPRESSION;  Surgeon: Corky Mull, MD;  Location: [facility];  Service: Orthopedics;  Laterality: Right;  . SHOULDER ARTHROSCOPY WITH OPEN ROTATOR CUFF REPAIR Right 03/16/2017   Procedure: SHOULDER ARTHROSCOPY WITH OPEN ROTATOR CUFF REPAIR POSSIBLE BICEPS TENODESIS;  Surgeon: Corky Mull, MD;  Location: [facility];  Service: Orthopedics;  Laterality: Right;  . TRIGGER FINGER RELEASE Right     [place]    SOCIAL HISTORY:   Social History   Tobacco Use  . Smoking status: Former Smoker    Types: Cigarettes    Quit date: 08/31/1994    Years since quitting: 24.4  . Smokeless tobacco: Never Used  . Tobacco comment: quit 28 years ago  Substance Use Topics  . Alcohol use: No    Alcohol/week: 0.0 standard drinks    FAMILY HISTORY:   Family History  Problem Relation Age of Onset  . Hypertension Mother   . Anxiety disorder Mother   . Depression Mother   . Bipolar disorder Mother   . Heart disease Mother        No details  . Hyperlipidemia Mother   . Kidney disease Father   . Heart disease Father 51  . Hypertension Father   . Diabetes Father   . Stroke Father   . Colon cancer Father        dx in his 35's  . Anxiety disorder Father   . Depression Father   . Skin cancer Father   . Kidney disease Sister   . Thyroid nodules Sister   . Hypertension Sister   . Hypertension Sister   . Diabetes Sister   . Hyperlipidemia Sister   . Depression Sister   . Breast cancer Maternal Aunt 61  . Breast cancer Maternal Aunt 70  . Ovarian cancer Cousin   . Colon cancer Cousin   . Breast cancer Other   . Kidney cancer Neg Hx   . Bladder Cancer Neg Hx     DRUG ALLERGIES:   Allergies  Allergen Reactions  . Lipitor [Atorvastatin] Other (See Comments)    Leg pains  . Tramadol Other (See  Comments)    Mouth feels like it's on fire  . Lactose Intolerance (Gi) Nausea Only    REVIEW OF SYSTEMS:   ROS As per history of present illness. All pertinent systems were reviewed above. Constitutional,  HEENT, cardiovascular, respiratory, GI, GU, musculoskeletal, neuro, psychiatric, endocrine,  integumentary and hematologic systems were reviewed and are otherwise  negative/unremarkable except for positive findings mentioned above in the HPI.   MEDICATIONS AT HOME:   Prior to Admission  medications   Medication Sig Start Date End Date Taking? Authorizing Provider  ALPRAZolam Duanne Moron) 1 MG tablet TAKE 1 TABLET BY MOUTH ONCE OR TWICE A DAY FOR ANXIETY 12/09/18  Yes [name], MD  amLODipine (NORVASC) 5 MG tablet Take 5 mg by mouth daily.  12/01/18 12/01/19 Yes [provider]  buPROPion (WELLBUTRIN XL) 150 MG 24 hr tablet TAKE 1 TABLET BY MOUTH EVERY DAY Patient taking differently: Take 150 mg by mouth daily.  09/27/18  Yes [name], MD  clopidogrel (PLAVIX) 75 MG tablet Take 1 tablet (75 mg total) by mouth daily. 07/08/18  Yes Theora Gianotti, NP  diphenoxylate-atropine (LOMOTIL) 2.5-0.025 MG tablet Take one capsule 1-4 times daily as needed for diarrhea Patient taking differently: Take 1-4 tablets by mouth daily. Take one capsule 1-4 times daily for diarrhea 01/10/19  Yes [name], MD  gabapentin (NEURONTIN) 300 MG capsule Take 1 capsule (300 mg total) by mouth 2 (two) times daily. 10/12/18  Yes [name], MD  Galcanezumab-gnlm (EMGALITY) 120 MG/ML SOAJ INJECT 240 MG SUBCUTANEOUSLY AS DIRECTED FOR THE FIRST MONTH. 11/11/18  Yes [provider]  isosorbide mononitrate (IMDUR) 30 MG 24 hr tablet Take 1 tablet (30 mg total) by mouth daily. 09/15/18  Yes Minna Merritts, MD  levothyroxine (SYNTHROID, LEVOTHROID) 25 MCG tablet Take 1 tablet (25 mcg total) by mouth daily before breakfast. 03/28/18  Yes [name], MD  losartan (COZAAR) 25  MG tablet Take 25 mg by mouth daily.   Yes [provider]  metFORMIN (GLUCOPHAGE-XR) 500 MG 24 hr tablet TAKE 1 TABLET BY MOUTH ONCE DAILY WITH DINNER 11/02/18  Yes [provider]  metoprolol succinate (TOPROL XL) 25 MG 24 hr tablet Take 1 tablet (25 mg total) by mouth daily. 12/27/18  Yes Gollan, Kathlene November, MD  nitroGLYCERIN (NITROSTAT) 0.4 MG SL tablet Place 1 tablet (0.4 mg total) under the tongue every 5 (five) minutes as needed for chest pain. 06/02/17  Yes Gollan, Kathlene November, MD  pantoprazole (PROTONIX) 40 MG tablet Take 1 tablet (40 mg total) by mouth 2 (two) times daily. 03/02/18  Yes [name], MD  promethazine (PHENERGAN) 25 MG tablet Take 1 tablet (25 mg total) by mouth every 6 (six) hours as needed for nausea or vomiting. 11/07/18  Yes [name], MD  rosuvastatin (CRESTOR) 40 MG tablet Take 1 tablet (40 mg total) by mouth daily. 10/12/18  Yes [name], MD  venlafaxine XR (EFFEXOR-XR) 75 MG 24 hr capsule Take 1 capsule (75 mg total) by mouth daily with breakfast. 09/27/18  Yes [name], MD      VITAL SIGNS:  Blood pressure (!) 150/90, pulse 68, temperature 98.3 F (36.8 C), temperature source Oral, resp. rate 17, height _0  (1.575 m), weight 72.6 kg, SpO2 99 %.  PHYSICAL EXAMINATION:  Physical Exam  GENERAL:  48 y.o.-year-old [name] patient lying in the bed with no acute distress.  EYES: Pupils equal, round, reactive to light and accommodation. No scleral icterus. Extraocular muscles intact.  HEENT: Head atraumatic, normocephalic. Oropharynx and nasopharynx clear.  NECK:  Supple, no jugular venous distention. No thyroid enlargement, no tenderness.  LUNGS: Normal breath sounds bilaterally, no wheezing, rales,rhonchi or crepitation. No use of accessory muscles of respiration.  CARDIOVASCULAR: Regular rate and rhythm, S1, S2 normal. No murmurs, rubs, or gallops.  ABDOMEN: Soft, nondistended, nontender. Bowel sounds present. No  organomegaly or mass.  EXTREMITIES: No pedal edema, cyanosis,  or clubbing.  NEUROLOGIC: Cranial nerves II through XII are intact. Muscle strength 5/5 in all extremities. Sensation intact. Gait not checked.  PSYCHIATRIC: The patient is alert and oriented x 3.  Normal affect and good eye contact. SKIN: No obvious rash, lesion, or ulcer.   LABORATORY PANEL:   CBC Recent Labs  Lab 01/19/19 1537  WBC 10.4  HGB 9.5*  HCT 31.9*  PLT 234   ------------------------------------------------------------------------------------------------------------------  Chemistries  Recent Labs  Lab 01/19/19 1537  NA 140  K 3.1*  CL 105  CO2 24  GLUCOSE 94  BUN 17  CREATININE 1.74*  CALCIUM 8.1*   ------------------------------------------------------------------------------------------------------------------  Cardiac Enzymes No results for input(s): TROPONINI in the last 168 hours. ------------------------------------------------------------------------------------------------------------------  RADIOLOGY:  DG Chest 2 View  Result Date: 01/19/2019 CLINICAL DATA:  Pt c/o CP since Tuesday that is pressure like in nature and some SOB. Pt states called her MD and was advised to come to the ED. Hx of CHF, stage 3 kidney failure. EXAM: CHEST - 2 VIEW COMPARISON:  10/12/2018 FINDINGS: Cardiac silhouette is normal in size. No mediastinal or hilar masses. No evidence of adenopathy. Clear lungs.  No pleural effusion or pneumothorax. Skeletal structures are intact. IMPRESSION: No active cardiopulmonary disease. Electronically Signed   By: Lajean Manes M.D.   On: 01/19/2019 15:50   CT Angio Chest PE W and/or Wo Contrast  Result Date: 01/19/2019 CLINICAL DATA:  Shortness of breath EXAM: CT ANGIOGRAPHY CHEST WITH CONTRAST TECHNIQUE: Multidetector CT imaging of the chest was performed using the standard protocol during bolus administration of intravenous contrast. Multiplanar CT image reconstructions and  MIPs were obtained to evaluate the vascular anatomy. CONTRAST:  10m OMNIPAQUE IOHEXOL 350 MG/ML SOLN COMPARISON:  05/31/2015 FINDINGS: Cardiovascular: Contrast injection is sufficient to demonstrate satisfactory opacification of the pulmonary arteries to the segmental level. There is no pulmonary embolus. The main pulmonary artery is within normal limits for size. There is no CT evidence of acute right heart strain. There are mild atherosclerotic changes of the visualized thoracic aorta. 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 There is a bypass graft from the left common carotid artery to the left subclavian artery. The visualized portions of the left subclavian artery are grossly patent beyond the level of the anastomosis. The appearance of this finding is similar to prior CT. Heart size is mildly enlarged. Coronary artery calcifications are noted. There is no significant pericardial effusion. Mediastinum/Nodes: --No mediastinal or hilar lymphadenopathy. --No axillary lymphadenopathy. --No supraclavicular lymphadenopathy. --the left hemi thyroid appears to be either surgically absent or atretic. --The esophagus is unremarkable Lungs/Pleura: No pulmonary nodules or masses. No pleural effusion or pneumothorax. No focal airspace consolidation. No focal pleural abnormality. Upper Abdomen: The patient appears to be status post prior gastric bypass. The patient is status post prior cholecystectomy. The upper abdomen is otherwise unremarkable. Musculoskeletal: No chest wall abnormality. No acute or significant osseous findings. Review of the MIP images confirms the above findings. IMPRESSION: 1. No evidence for pulmonary embolus or other acute intrathoracic process. 2. 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 There is a bypass graft from the left common carotid artery to the left subclavian artery. The visualized  portions of the left subclavian artery are grossly patent beyond the level of the anastomosis. Overall, this is stable since 2017. Aortic Atherosclerosis (ICD10-I70.0). Electronically Signed   By: CConstance HolsterM.D.   On: 01/19/2019 19:13  IMPRESSION AND PLAN:   1. Chest pain, rule out acute coronary syndrome with history of coronary artery disease status post PCI and stent, with mildly elevated troponin I. The patient will be admitted to an observation telemetry bed.  Will follow serial cardiac enzymes and EKGs.  We will obtain a cardiology consult in a.m. by [facility] for further cardiac risk stratification.  The patient will be continued on aspirin and Plavix as well as p.r.n. sublingual nitroglycerin, Imdur and placed on IV morphine sulfate for pain.  I notified Dr. [name] regarding the patient.  2.  Type II diabetes mellitus.  The patient will be placed on supplement coverage with NovoLog.  3.  Hypothyroidism.  We will continue Synthroid and check TSH level.  4.  Hypertension.  We will continue Toprol-XL, Norvasc and Cozaar.  5.  Peripheral neuropathy.  Neurontin will be resumed.  6.  DVT prophylaxis.  The patient will be continued on IV heparin drip.  7.  GI prophylaxis.  PPI therapy will be resumed with Protonix.   All the records are reviewed and case discussed with ED provider. The plan of care was discussed in details with the patient (and family). I answered all questions. The patient agreed to proceed with the above mentioned plan. Further management will depend upon hospital course.   CODE STATUS: Discussed with the patient and she desires to be full code   Christel Mormon M.D on 01/19/2019 at 8:43 PM  [facility]   From 7 PM-7 AM, contact night-coverage [email or web address]  CC: Primary care physician; [name], MD   Note: This dictation was prepared with Dragon dictation along with smaller phrase technology. Any transcriptional errors that result from  this process are unintentional.

## 2019-01-19 NOTE — ED Notes (Signed)
Report given to Maureen RN.

## 2019-01-19 NOTE — ED Notes (Addendum)
Unable to give pt meds at this time due to waiting for pharmacy to verify.

## 2019-01-19 NOTE — Telephone Encounter (Signed)
I called patient and gave her this information.

## 2019-01-20 DIAGNOSIS — Z8249 Family history of ischemic heart disease and other diseases of the circulatory system: Secondary | ICD-10-CM | POA: Diagnosis not present

## 2019-01-20 DIAGNOSIS — Z8349 Family history of other endocrine, nutritional and metabolic diseases: Secondary | ICD-10-CM | POA: Diagnosis not present

## 2019-01-20 DIAGNOSIS — I252 Old myocardial infarction: Secondary | ICD-10-CM | POA: Diagnosis not present

## 2019-01-20 DIAGNOSIS — D509 Iron deficiency anemia, unspecified: Secondary | ICD-10-CM | POA: Diagnosis present

## 2019-01-20 DIAGNOSIS — E782 Mixed hyperlipidemia: Secondary | ICD-10-CM

## 2019-01-20 DIAGNOSIS — I25110 Atherosclerotic heart disease of native coronary artery with unstable angina pectoris: Secondary | ICD-10-CM | POA: Diagnosis present

## 2019-01-20 DIAGNOSIS — I200 Unstable angina: Secondary | ICD-10-CM | POA: Diagnosis present

## 2019-01-20 DIAGNOSIS — Z87891 Personal history of nicotine dependence: Secondary | ICD-10-CM | POA: Diagnosis not present

## 2019-01-20 DIAGNOSIS — G629 Polyneuropathy, unspecified: Secondary | ICD-10-CM | POA: Diagnosis present

## 2019-01-20 DIAGNOSIS — K921 Melena: Secondary | ICD-10-CM | POA: Diagnosis not present

## 2019-01-20 DIAGNOSIS — Z841 Family history of disorders of kidney and ureter: Secondary | ICD-10-CM | POA: Diagnosis not present

## 2019-01-20 DIAGNOSIS — Z818 Family history of other mental and behavioral disorders: Secondary | ICD-10-CM | POA: Diagnosis not present

## 2019-01-20 DIAGNOSIS — I10 Essential (primary) hypertension: Secondary | ICD-10-CM

## 2019-01-20 DIAGNOSIS — E785 Hyperlipidemia, unspecified: Secondary | ICD-10-CM | POA: Diagnosis present

## 2019-01-20 DIAGNOSIS — I739 Peripheral vascular disease, unspecified: Secondary | ICD-10-CM

## 2019-01-20 DIAGNOSIS — F319 Bipolar disorder, unspecified: Secondary | ICD-10-CM | POA: Diagnosis present

## 2019-01-20 DIAGNOSIS — E039 Hypothyroidism, unspecified: Secondary | ICD-10-CM | POA: Diagnosis present

## 2019-01-20 DIAGNOSIS — E1122 Type 2 diabetes mellitus with diabetic chronic kidney disease: Secondary | ICD-10-CM | POA: Diagnosis present

## 2019-01-20 DIAGNOSIS — Z8673 Personal history of transient ischemic attack (TIA), and cerebral infarction without residual deficits: Secondary | ICD-10-CM | POA: Diagnosis not present

## 2019-01-20 DIAGNOSIS — Z823 Family history of stroke: Secondary | ICD-10-CM | POA: Diagnosis not present

## 2019-01-20 DIAGNOSIS — R079 Chest pain, unspecified: Secondary | ICD-10-CM | POA: Diagnosis not present

## 2019-01-20 DIAGNOSIS — Z833 Family history of diabetes mellitus: Secondary | ICD-10-CM | POA: Diagnosis not present

## 2019-01-20 DIAGNOSIS — E876 Hypokalemia: Secondary | ICD-10-CM | POA: Diagnosis present

## 2019-01-20 DIAGNOSIS — I160 Hypertensive urgency: Secondary | ICD-10-CM | POA: Diagnosis not present

## 2019-01-20 DIAGNOSIS — I119 Hypertensive heart disease without heart failure: Secondary | ICD-10-CM

## 2019-01-20 DIAGNOSIS — I130 Hypertensive heart and chronic kidney disease with heart failure and stage 1 through stage 4 chronic kidney disease, or unspecified chronic kidney disease: Secondary | ICD-10-CM | POA: Diagnosis present

## 2019-01-20 DIAGNOSIS — I5031 Acute diastolic (congestive) heart failure: Secondary | ICD-10-CM | POA: Diagnosis present

## 2019-01-20 DIAGNOSIS — I213 ST elevation (STEMI) myocardial infarction of unspecified site: Secondary | ICD-10-CM | POA: Diagnosis present

## 2019-01-20 DIAGNOSIS — I25118 Atherosclerotic heart disease of native coronary artery with other forms of angina pectoris: Secondary | ICD-10-CM | POA: Diagnosis not present

## 2019-01-20 DIAGNOSIS — I2510 Atherosclerotic heart disease of native coronary artery without angina pectoris: Secondary | ICD-10-CM | POA: Diagnosis not present

## 2019-01-20 DIAGNOSIS — N183 Chronic kidney disease, stage 3 unspecified: Secondary | ICD-10-CM | POA: Diagnosis present

## 2019-01-20 DIAGNOSIS — Z20828 Contact with and (suspected) exposure to other viral communicable diseases: Secondary | ICD-10-CM | POA: Diagnosis present

## 2019-01-20 DIAGNOSIS — Z800 Family history of malignant neoplasm of digestive organs: Secondary | ICD-10-CM | POA: Diagnosis not present

## 2019-01-20 LAB — GLUCOSE, CAPILLARY
Glucose-Capillary: 68 mg/dL — ABNORMAL LOW (ref 70–99)
Glucose-Capillary: 104 mg/dL — ABNORMAL HIGH (ref 70–99)
Glucose-Capillary: 74 mg/dL (ref 70–99)
Glucose-Capillary: 84 mg/dL (ref 70–99)
Glucose-Capillary: 68 mg/dL — ABNORMAL LOW (ref 70–99)

## 2019-01-20 LAB — CBC
HCT: 27.4 % — ABNORMAL LOW (ref 36.0–46.0)
Hemoglobin: 8.7 g/dL — ABNORMAL LOW (ref 12.0–15.0)
MCH: 21 pg — ABNORMAL LOW (ref 26.0–34.0)
MCHC: 31.8 g/dL (ref 30.0–36.0)
MCV: 66.2 fL — ABNORMAL LOW (ref 80.0–100.0)
Platelets: 185 10*3/uL (ref 150–400)
RBC: 4.14 MIL/uL (ref 3.87–5.11)
RDW: 16.8 % — ABNORMAL HIGH (ref 11.5–15.5)
WBC: 9.2 10*3/uL (ref 4.0–10.5)
nRBC: 0 % (ref 0.0–0.2)

## 2019-01-20 LAB — PROTIME-INR
INR: 1.1 (ref 0.8–1.2)
Prothrombin Time: 13.9 seconds (ref 11.4–15.2)

## 2019-01-20 LAB — HEMOGLOBIN A1C
Hgb A1c MFr Bld: 5.9 % — ABNORMAL HIGH (ref 4.8–5.6)
Mean Plasma Glucose: 122.63 mg/dL

## 2019-01-20 LAB — CREATININE, SERUM
Creatinine, Ser: 1.58 mg/dL — ABNORMAL HIGH (ref 0.44–1.00)
GFR calc Af Amer: 44 mL/min — ABNORMAL LOW (ref >60)
GFR calc non Af Amer: 38 mL/min — ABNORMAL LOW (ref >60)

## 2019-01-20 LAB — TSH: TSH: 3.5 u[IU]/mL (ref 0.350–4.500)

## 2019-01-20 LAB — TROPONIN I (HIGH SENSITIVITY): Troponin I (High Sensitivity): 16 ng/L (ref <18)

## 2019-01-20 LAB — HEPARIN LEVEL (UNFRACTIONATED): Heparin Unfractionated: 0.26 IU/mL — ABNORMAL LOW (ref 0.30–0.70)

## 2019-01-20 LAB — MAGNESIUM: Magnesium: 1.3 mg/dL — ABNORMAL LOW (ref 1.7–2.4)

## 2019-01-20 LAB — APTT: aPTT: 73 seconds — ABNORMAL HIGH (ref 24–36)

## 2019-01-20 MED ORDER — AMLODIPINE BESYLATE 5 MG PO TABS
5.0000 mg | ORAL_TABLET | Freq: Once | ORAL | Status: AC
  Administered 2019-01-20: 5 mg via ORAL
  Filled 2019-01-20: qty 1

## 2019-01-20 MED ORDER — HEPARIN BOLUS VIA INFUSION
800.0000 [IU] | Freq: Once | INTRAVENOUS | Status: AC
  Administered 2019-01-20: 800 [IU] via INTRAVENOUS
  Filled 2019-01-20: qty 800

## 2019-01-20 MED ORDER — METOPROLOL SUCCINATE ER 50 MG PO TB24
50.0000 mg | ORAL_TABLET | Freq: Every day | ORAL | Status: DC
  Administered 2019-01-21: 50 mg via ORAL
  Filled 2019-01-20: qty 1

## 2019-01-20 MED ORDER — HEPARIN SODIUM (PORCINE) 5000 UNIT/ML IJ SOLN
5000.0000 [IU] | Freq: Three times a day (TID) | INTRAMUSCULAR | Status: DC
  Administered 2019-01-20 – 2019-01-23 (×9): 5000 [IU] via SUBCUTANEOUS
  Filled 2019-01-20 (×9): qty 1

## 2019-01-20 MED ORDER — HYDRALAZINE HCL 50 MG PO TABS
50.0000 mg | ORAL_TABLET | Freq: Four times a day (QID) | ORAL | Status: DC | PRN
  Filled 2019-01-20: qty 1

## 2019-01-20 MED ORDER — INSULIN ASPART 100 UNIT/ML ~~LOC~~ SOLN: 0.0000 [IU] | Freq: Three times a day (TID) | SUBCUTANEOUS | Status: DC

## 2019-01-20 MED ORDER — METOPROLOL SUCCINATE ER 25 MG PO TB24
25.0000 mg | ORAL_TABLET | Freq: Once | ORAL | Status: AC
  Administered 2019-01-20: 25 mg via ORAL
  Filled 2019-01-20: qty 1

## 2019-01-20 MED ORDER — AMLODIPINE BESYLATE 10 MG PO TABS
10.0000 mg | ORAL_TABLET | Freq: Every day | ORAL | Status: DC
  Administered 2019-01-21 – 2019-01-23 (×3): 10 mg via ORAL
  Filled 2019-01-20 (×3): qty 1

## 2019-01-20 MED ORDER — ASPIRIN EC 81 MG PO TBEC
81.0000 mg | DELAYED_RELEASE_TABLET | Freq: Every day | ORAL | Status: DC
  Administered 2019-01-21 – 2019-01-23 (×3): 81 mg via ORAL
  Filled 2019-01-20 (×6): qty 1

## 2019-01-20 MED ORDER — POTASSIUM CHLORIDE CRYS ER 20 MEQ PO TBCR
30.0000 meq | EXTENDED_RELEASE_TABLET | Freq: Once | ORAL | Status: AC
  Administered 2019-01-20: 30 meq via ORAL
  Filled 2019-01-20: qty 1

## 2019-01-20 NOTE — Telephone Encounter (Signed)
Seen in hospital

## 2019-01-20 NOTE — Care Management Obs Status (Signed)
[facility] NOTIFICATION   Patient Details  Name: [name] MRN: [id number] Date of Birth: [birth year]   Medicare Observation Status Notification Given:  Yes(Reviewed with patient over the phone. Will print for RN to give to her.)    Candie Chroman, LCSW 01/20/2019, 4:51 PM

## 2019-01-20 NOTE — Consult Note (Signed)
Cardiology Consultation:   Patient ID: [name]; [id number]; 1971-01-13   Admit date: 01/19/2019 Date of Consult: 01/20/2019  Primary Care Provider: [name], MD Primary Cardiologist: Constance Holster   Patient Profile:   [name] [name] is a 48 y.o. female with a hx of CAD s/p prior interventions as detailed below, HFrEF secondary to ICM with prior EF of 45-50% in 2016 subsequent normalization of LVSF by echo in 10/2015, CKD stage III, prior CVAs, left carotid to subclavian artery bypass with subclavian artery ligation, aortic arch aneurysm, anemia, obesity status post gastric bypass, bipolar disorder, DM with polyneuropathy, migraine disorder, HTN, HLD, and frequent falls/syncope/near syncope who is being seen today for the evaluation of chest pain at the request of Dr. Sidney Ace.  History of Present Illness:   [name] was diagnosed with CAD in 08/2003 with LHC at that time showing moderate LAD disease that was medically managed. She was seen in 11/2014, with angina and underwent LHC with PCI/DES placement to the mid LAD. She has not been able to tolerate escalation of medical management in the setting of relative hypotension or adverse effects of antianginal therapy. In 06/2015, she had repeat LHC that showed a patent LAD stent with development of significant D1 disease that was successfully treated with PCI/DES. With recurrent angina in 06/2017, LHC showed severe stenosis in the D2 s/p PTCA. Recurrent angina in 08/2017 led to repeat LHC that showed restenosis of the D2 that was treated with cutting balloon angioplasty. Most recently, she underwent LHC in 12/2017 and was found 95% ostial stenosis/restenosis of the D2 with significant apical LAD disease. Given the recurrent restenosis with the D2, medical therapy was felt to be warranted. She was admitted to the hospital in 02/2018 with CVA with MRI showing a late acute/early subacute left medial frontal lobe infarct. Echo at  that time showed showed normal LVSF without cardiac source of emboli.   Her father recently passed away.  She was admitted to the hospital in 10/2018 after a mechanical fall leading to a displaced intertrochanteric fracture of the left femur s/p ORIF.   She was seen in 12/2018 with recommendation to increase metoprolol secondary to palpitations (prior cardiac monitoring unable to exclude atrial tachycardia) and elevated BP.  More recently, she has been having issues with elevated BP into the 628M to 381R systolic. She self resumed Imdur (has had headaches) to help with BP. In this setting, she developed SOB and chest pain at rest. No worsening exertional symptoms.  She contacted our office on 12/17 with chest pain and SOB occurring while laying in the bed and was advised to go to the ED.   Upon the patient's arrival to [facility] they were found to have BP ranging from the 711A to 579U systolic, HR 38B to 33O bpm, temp afebrile, oxygen saturation 100% on room air, weight 72.6 kg. EKG showed sinus rhythm with nonspecific st/t changes as below, CXR showed no active cardiopulmonary disease, CTA chest negative for PE. Labs showed high sensitivity troponin 21 with a delta of 21, d dimer 739.81, HGB 9.5-->8.7 at her ~ baseline, potassium 3.1, BUN 17, SCr 1.74, TSH normal, influenza and COVID 19 negative. In the ED, she was given ASA 3254 mg along with IV morphine 2 mg and 4 mg. She was placed on heparin gtt. Currently, chest pain free.   Past Medical History:  Diagnosis Date   Acute pyelonephritis    Anemia    iron deficiency  anemia   Aortic arch aneurysm (HCC)    Bipolar disorder ([place])    BRCA negative 2014   CAD (coronary artery disease)    a. 08/2003 Cath: LAD 30-40-med Rx; b. 11/2014 PCI: LAD 41m(3.25x23 Xience Alpine DES); c. 06/2015 PCI: D1 (2.25x12 Resolute Integrity DES); d. 06/2017 PCI: Patent mLAD stent, D2 95 (PTCA); e. 09/2017 PCI: D2 99ost (CBA); d. 12/2017 Cath: LM nl, LAD 355m80d (small),  D1 40ost, D2 95ost, LCX 40p, RCA 40ost/p->Med rx for D2 given restenosis.   CKD (chronic kidney disease), stage III    Colon polyp    CVA (cerebral vascular accident) ([place]   Left side weakness.    Diabetes ([facility]   Family history of breast cancer    BRCA neg 2014   Gastric ulcer 04/27/2011   History of echocardiogram    a. 03/2017 Echo: EF 60-65%, no rwma; b. 02/2018 Echo: EF 60-65%, no rwma. Nl RV fxn. No cardiac source of emboli (admitted w/ stroke).   HTN (hypertension)    Hyperlipemia    Hypothyroid    Malignant melanoma of skin of scalp (HCC)    MI, acute, non ST segment elevation (HCC)    Neuromuscular disorder (HCC)    Orthostatic hypotension    S/P drug eluting coronary stent placement 06/04/2015   [place] ([place]   Stroke ([facility]   a. 02/2018 MRI: 16m48mate acute/early subacute L medial frontal lobe inarct; b. 02/2018 MRA No large vessel occlusion or aneurysm. Mod to sev L P2 stenosis. thready L vertebral artery, diffusely dzs'd; c. 02/2018 Carotid U/S: <50% bilat ICA dzs.    Past Surgical History:  Procedure Laterality Date   APPENDECTOMY     CARDIAC CATHETERIZATION N/A 11/09/2014   Procedure: Coronary Angiography;  Surgeon: TimMinna MerrittsD;  [facility];  Service: Cardiovascular;  Laterality: N/A;   CARDIAC CATHETERIZATION N/A 11/12/2014   Procedure: Coronary Stent Intervention;  Surgeon: AleIsaias CowmanD;  [place] [name];  Service: Cardiovascular;  Laterality: N/A;   CARDIAC CATHETERIZATION N/A 04/18/2015   Procedure: Left Heart Cath and Coronary Angiography;  Surgeon: TimMinna MerrittsD;  [facility];  Service: Cardiovascular;  Laterality: N/A;   CARDIAC CATHETERIZATION Left 06/04/2015   Procedure: Left Heart Cath and Coronary Angiography;  Surgeon: MuhWellington HampshireD;  [facility];  Service: Cardiovascular;  Laterality: Left;   CARDIAC CATHETERIZATION N/A  06/04/2015   Procedure: Coronary Stent Intervention;  Surgeon: MuhWellington HampshireD;  [facility];  Service: Cardiovascular;  Laterality: N/A;   CESAREAN SECTION  2001   CHOLECYSTECTOMY N/A 11/18/2016   Procedure: LAPAROSCOPIC CHOLECYSTECTOMY WITH INTRAOPERATIVE CHOLANGIOGRAM;  Surgeon: SanChristene LyeD;  [facility];  Service: General;  Laterality: N/A;   COLONOSCOPY WITH PROPOFOL N/A 04/27/2016   Procedure: COLONOSCOPY WITH PROPOFOL;  Surgeon: DarLucilla LameD;  [facility];  Laterality: N/A;   COLONOSCOPY WITH PROPOFOL N/A 01/12/2018   Procedure: COLONOSCOPY WITH PROPOFOL;  Surgeon: Toledo, TeoBenay PikeD;  [facility]: [facility];  Service: Endoscopy;  Laterality: N/A;   CORONARY ANGIOPLASTY     CORONARY BALLOON ANGIOPLASTY N/A 06/29/2017   Procedure: CORONARY BALLOON ANGIOPLASTY;  Surgeon: AriWellington HampshireD;  [facility];  Service: Cardiovascular;  Laterality: N/A;   CORONARY BALLOON ANGIOPLASTY N/A 09/20/2017   Procedure: CORONARY BALLOON ANGIOPLASTY;  Surgeon: AriWellington HampshireD;  Location:  [place] [facility];  Service: Cardiovascular;  Laterality: N/A;   DILATION AND CURETTAGE OF UTERUS     ESOPHAGOGASTRODUODENOSCOPY (EGD) WITH PROPOFOL N/A 09/14/2014   Procedure: ESOPHAGOGASTRODUODENOSCOPY (EGD) WITH PROPOFOL;  Surgeon: Josefine Class, MD;  Location: [facility];  Service: Endoscopy;  Laterality: N/A;   ESOPHAGOGASTRODUODENOSCOPY (EGD) WITH PROPOFOL N/A 04/27/2016   Procedure: ESOPHAGOGASTRODUODENOSCOPY (EGD) WITH PROPOFOL;  Surgeon: [name], MD;  Location: [place];  Service: Endoscopy;  Laterality: N/A;  Diabetic - oral meds   ESOPHAGOGASTRODUODENOSCOPY (EGD) WITH PROPOFOL N/A 01/12/2018   Procedure: ESOPHAGOGASTRODUODENOSCOPY (EGD) WITH PROPOFOL;  Surgeon: Toledo, Benay Pike, MD;  Location: [facility];  Service: Endoscopy;  Laterality: N/A;   GASTRIC  BYPASS  09/2009   [facility]    INTRAMEDULLARY (IM) NAIL INTERTROCHANTERIC Left 10/13/2018   Procedure: INTRAMEDULLARY (IM) NAIL INTERTROCHANTRIC;  Surgeon: Leandrew Koyanagi, MD;  Location: [place];  Service: Orthopedics;  Laterality: Left;   Left Carotid to sublcavian artery bypass w/ subclavian artery ligation     a. Performed @ [facility].   LEFT HEART CATH AND CORONARY ANGIOGRAPHY Left 06/29/2017   Procedure: LEFT HEART CATH AND CORONARY ANGIOGRAPHY;  Surgeon: Wellington Hampshire, MD;  Location: [place] [facility];  Service: Cardiovascular;  Laterality: Left;   LEFT HEART CATH AND CORONARY ANGIOGRAPHY N/A 09/20/2017   Procedure: LEFT HEART CATH AND CORONARY ANGIOGRAPHY;  Surgeon: Wellington Hampshire, MD;  Location: [place] [facility];  Service: Cardiovascular;  Laterality: N/A;   LEFT HEART CATH AND CORONARY ANGIOGRAPHY N/A 12/20/2017   Procedure: LEFT HEART CATH AND CORONARY ANGIOGRAPHY;  Surgeon: Wellington Hampshire, MD;  Location: [place] [facility];  Service: Cardiovascular;  Laterality: N/A;   MELANOMA EXCISION  2016   Dr. Henreitta Cea ABLATION  2002   RIGHT OOPHORECTOMY     SHOULDER ARTHROSCOPY WITH OPEN ROTATOR CUFF REPAIR Right 01/07/2016   Procedure: SHOULDER ARTHROSCOPY WITH DEBRIDMENT, SUBACHROMIAL DECOMPRESSION;  Surgeon: Corky Mull, MD;  Location: [facility];  Service: Orthopedics;  Laterality: Right;   SHOULDER ARTHROSCOPY WITH OPEN ROTATOR CUFF REPAIR Right 03/16/2017   Procedure: SHOULDER ARTHROSCOPY WITH OPEN ROTATOR CUFF REPAIR POSSIBLE BICEPS TENODESIS;  Surgeon: Corky Mull, MD;  Location: [facility];  Service: Orthopedics;  Laterality: Right;   TRIGGER FINGER RELEASE Right     [place]     Home Meds: Prior to Admission medications   Medication Sig Start Date End Date Taking? Authorizing Provider  ALPRAZolam Duanne Moron) 1 MG tablet TAKE 1 TABLET BY MOUTH ONCE OR TWICE A DAY FOR ANXIETY 12/09/18  Yes [name], MD  amLODipine (NORVASC) 5  MG tablet Take 5 mg by mouth daily.  12/01/18 12/01/19 Yes [provider]  buPROPion (WELLBUTRIN XL) 150 MG 24 hr tablet TAKE 1 TABLET BY MOUTH EVERY DAY Patient taking differently: Take 150 mg by mouth daily.  09/27/18  Yes [name], MD  clopidogrel (PLAVIX) 75 MG tablet Take 1 tablet (75 mg total) by mouth daily. 07/08/18  Yes Theora Gianotti, NP  diphenoxylate-atropine (LOMOTIL) 2.5-0.025 MG tablet Take one capsule 1-4 times daily as needed for diarrhea Patient taking differently: Take 1-4 tablets by mouth daily. Take one capsule 1-4 times daily for diarrhea 01/10/19  Yes [name], MD  gabapentin (NEURONTIN) 300 MG capsule Take 1 capsule (300 mg total) by mouth 2 (two) times daily. 10/12/18  Yes [name], MD  Galcanezumab-gnlm (EMGALITY) 120 MG/ML SOAJ INJECT 240 MG SUBCUTANEOUSLY AS  DIRECTED FOR THE FIRST MONTH. 11/11/18  Yes [provider]  isosorbide mononitrate (IMDUR) 30 MG 24 hr tablet Take 1 tablet (30 mg total) by mouth daily. 09/15/18  Yes Minna Merritts, MD  levothyroxine (SYNTHROID, LEVOTHROID) 25 MCG tablet Take 1 tablet (25 mcg total) by mouth daily before breakfast. 03/28/18  Yes [name], MD  losartan (COZAAR) 25 MG tablet Take 25 mg by mouth daily.   Yes [provider]  metFORMIN (GLUCOPHAGE-XR) 500 MG 24 hr tablet TAKE 1 TABLET BY MOUTH ONCE DAILY WITH DINNER 11/02/18  Yes [provider]  metoprolol succinate (TOPROL XL) 25 MG 24 hr tablet Take 1 tablet (25 mg total) by mouth daily. 12/27/18  Yes Gollan, Kathlene November, MD  nitroGLYCERIN (NITROSTAT) 0.4 MG SL tablet Place 1 tablet (0.4 mg total) under the tongue every 5 (five) minutes as needed for chest pain. 06/02/17  Yes Gollan, Kathlene November, MD  pantoprazole (PROTONIX) 40 MG tablet Take 1 tablet (40 mg total) by mouth 2 (two) times daily. 03/02/18  Yes [name], MD  promethazine (PHENERGAN) 25 MG tablet Take 1 tablet (25 mg total) by mouth every 6 (six)  hours as needed for nausea or vomiting. 11/07/18  Yes [name], MD  rosuvastatin (CRESTOR) 40 MG tablet Take 1 tablet (40 mg total) by mouth daily. 10/12/18  Yes [name], MD  venlafaxine XR (EFFEXOR-XR) 75 MG 24 hr capsule Take 1 capsule (75 mg total) by mouth daily with breakfast. 09/27/18  Yes [name], MD    Inpatient Medications: Scheduled Meds:  [START ON 01/21/2019] amLODipine  10 mg Oral Daily   amLODipine  5 mg Oral Once   [START ON 01/21/2019] aspirin EC  81 mg Oral Daily   buPROPion  150 mg Oral Daily   clopidogrel  75 mg Oral Daily   gabapentin  300 mg Oral BID   heparin  5,000 Units Subcutaneous Q8H   insulin aspart  0-9 Units Subcutaneous TID WC   isosorbide mononitrate  30 mg Oral Daily   levothyroxine  25 mcg Oral QAC breakfast   losartan  25 mg Oral Daily   metoprolol succinate  25 mg Oral Once   [START ON 01/21/2019] metoprolol succinate  50 mg Oral Daily   pantoprazole  40 mg Oral BID   potassium chloride  30 mEq Oral Once   rosuvastatin  40 mg Oral Daily   venlafaxine XR  75 mg Oral Q breakfast   Continuous Infusions:  sodium chloride 75 mL/hr at 01/19/19 2231   PRN Meds: acetaminophen, ALPRAZolam, morphine injection, nitroGLYCERIN, ondansetron (ZOFRAN) IV, promethazine, zolpidem  Allergies:   Allergies  Allergen Reactions   Lipitor [Atorvastatin] Other (See Comments)    Leg pains   Tramadol Other (See Comments)    Mouth feels like it's on fire   Lactose Intolerance (Gi) Nausea Only    Social History:   Social History   Socioeconomic History   Marital status: Divorced    Spouse name: Not on file   Number of children: 1   Years of education: Not on file   Highest education level: Not on file  Occupational History   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 Disabled through 08/02/2013 per forms 02/03/2013  Tobacco  Use   Smoking status: Former Smoker    Types: Cigarettes    Quit date: 08/31/1994  Years since quitting: 24.4   Smokeless tobacco: Never Used   Tobacco comment: quit 28 years ago  Substance and Sexual Activity   Alcohol use: No    Alcohol/week: 0.0 standard drinks   Drug use: No   Sexual activity: Not Currently    Birth control/protection: None  Other Topics Concern   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   Lives at home with son   Social Determinants of Health   Financial Resource Strain: Low Risk    Difficulty of Paying Living Expenses: Not hard at all  Food Insecurity: No Food Insecurity   Worried About Charity fundraiser in the Last Year: Never true   Arboriculturist in the Last Year: Never true  Transportation Needs: No Transportation Needs   Lack of Transportation (Medical): No   Lack of Transportation (Non-Medical): No  Physical Activity: Inactive   Days of Exercise per Week: 0 days   Minutes of Exercise per Session: 0 min  Stress: Stress Concern Present   Feeling of Stress : Very much  Social Connections: Somewhat Isolated   Frequency of Communication with Friends and Family: More than three times a week   Frequency of Social Gatherings with Friends and Family: Once a week   Attends Religious Services: More than 4 times per year   Active Member of [employer] or Organizations: No   Attends Music therapist: Not asked   Marital Status: Divorced  Human resources officer Violence: Not At Risk   Fear of Current or Ex-Partner: No   Emotionally Abused: No   Physically Abused: No   Sexually Abused: No     Family History:   Family History  Problem Relation Age of Onset   Hypertension Mother    Anxiety disorder Mother    Depression Mother    Bipolar disorder Mother    Heart disease Mother        No details   Hyperlipidemia Mother      Kidney disease Father    Heart disease Father 33   Hypertension Father    Diabetes Father    Stroke Father    Colon cancer Father        dx in his 74's   Anxiety disorder Father    Depression Father    Skin cancer Father    Kidney disease Sister    Thyroid nodules Sister    Hypertension Sister    Hypertension Sister    Diabetes Sister    Hyperlipidemia Sister    Depression Sister    Breast cancer Maternal Aunt 33   Breast cancer Maternal Aunt 50   Ovarian cancer Cousin    Colon cancer Cousin    Breast cancer Other    Kidney cancer Neg Hx    Bladder Cancer Neg Hx     ROS:  Review of Systems  Constitutional: Positive for malaise/fatigue. Negative for chills, diaphoresis, fever and weight loss.  HENT: Negative for congestion.   Eyes: Negative for discharge and redness.  Respiratory: Positive for shortness of breath. Negative for cough, hemoptysis, sputum production and wheezing.   Cardiovascular: Positive for chest pain. Negative for palpitations, orthopnea, claudication, leg swelling and PND.  Gastrointestinal: Negative for abdominal pain, blood in stool, heartburn, melena, nausea and vomiting.  Genitourinary: Negative for hematuria.  Musculoskeletal: Positive for falls. Negative for myalgias.  Skin: Negative for rash.  Neurological:  Positive for weakness and headaches. Negative for dizziness, tingling, tremors, sensory change, speech change, focal weakness and loss of consciousness.  Endo/Heme/Allergies: Does not bruise/bleed easily.  Psychiatric/Behavioral: Negative for substance abuse. The patient is not nervous/anxious.   All other systems reviewed and are negative.     Physical Exam/Data:   Vitals:   01/19/19 2158 01/20/19 0500 01/20/19 0613 01/20/19 0807  BP: (!) 149/89 (!) 168/89 (!) 187/88 (!) 177/100  Pulse: 76 68 62 72  Resp: '18 20 20   ' Temp:   98.7 F (37.1 C) 98.6 F (37 C)  TempSrc:   Oral Oral  SpO2: 100% 99% 100% 100%   Weight:   69.2 kg   Height:   '5\' 2"'  (1.575 m)     Intake/Output Summary (Last 24 hours) at 01/20/2019 1104 Last data filed at 01/20/2019 0759 Gross per 24 hour  Intake --  Output 1500 ml  Net -1500 ml   Filed Weights   01/19/19 1517 01/20/19 0613  Weight: 72.6 kg 69.2 kg   Body mass index is 27.89 kg/m.   Physical Exam: General: Well developed, well nourished, in no acute distress. Head: Normocephalic, atraumatic, sclera non-icteric, no xanthomas, nares without discharge.  Neck: Negative for carotid bruits. JVD not elevated. Lungs: Clear bilaterally to auscultation without wheezes, rales, or rhonchi. Breathing is unlabored. Heart: RRR with S1 S2. No murmurs, rubs, or gallops appreciated. Abdomen: Soft, non-tender, non-distended with normoactive bowel sounds. No hepatomegaly. No rebound/guarding. No obvious abdominal masses. Msk:  Strength and tone appear normal for age. Extremities: No clubbing or cyanosis. No edema. Distal pedal pulses are 2+ and equal bilaterally. Neuro: Alert and oriented X 3. No facial asymmetry. No focal deficit. Moves all extremities spontaneously. Psych:  Responds to questions appropriately with a normal affect.   EKG:  The EKG was personally reviewed and demonstrates: NSR, 80 bpm, nonspecific inferolateral st/t changes largely unchanged from prior  Telemetry:  Telemetry was personally reviewed and demonstrates: SR with occasional PVCs, rare ventricular couplet   Weights: Filed Weights   01/19/19 1517 01/20/19 0613  Weight: 72.6 kg 69.2 kg    Relevant CV Studies: 2D Echo 02/2018: - Left ventricle: The cavity size was normal. Systolic function was   normal. The estimated ejection fraction was in the range of 60%   to 65%. Wall motion was normal; there were no regional wall   motion abnormalities. Left ventricular diastolic function   parameters were normal. - Left atrium: The atrium was normal in size. - Right ventricle: Systolic function was  normal. - Pulmonary arteries: Systolic pressure was within the normal   range.  Impressions:  - No cardiac source of emboli was indentified. __________  LHC 12/2017:  Non-stenotic 1st Diag lesion was previously treated.  Ost 1st Diag lesion is 40% stenosed.  Mid LAD-2 lesion is 5% stenosed.  Ost 2nd Diag to 2nd Diag lesion is 95% stenosed.  Scoring balloon angioplasty balloon angioplasty was performed.  Dist LAD lesion is 80% stenosed.  Mid LAD-1 lesion is 30% stenosed.  Prox Cx lesion is 40% stenosed.  Ost RCA to Prox RCA lesion is 40% stenosed.   1.  Patent first diagonal and mid LAD stent with no significant restenosis.  Significant disease affecting the distal LAD close to the apex as well as recurrent restenosis in the ostial second diagonal which was treated in the past with angioplasty most recently in August with cutting balloon angioplasty. 2.  Normal left ventricular end-diastolic pressure.  Left ventricular angiography  was not performed.  Recommendations: The patient might have anginal symptoms related to disease in the distal LAD as well as ostial second diagonal.  Unfortunately, the LAD disease is too distal to stent and the ostial diagonal has been treated twice with balloon angioplasty with recurrent restenosis.  This is not an optimal location to place a stent as it will pinch the mid LAD.  The only way to treat this with stent would be to perform bifurcation stenting including the mid LAD.  However, the mid LAD disease does not seem to be obstructive and I feel that the long-term risks outweigh the benefit. I recommend continuing medical therapy.  Laboratory Data:  Chemistry Recent Labs  Lab 01/19/19 1537  NA 140  K 3.1*  CL 105  CO2 24  GLUCOSE 94  BUN 17  CREATININE 1.74*  CALCIUM 8.1*  GFRNONAA 34*  GFRAA 39*  ANIONGAP 11    No results for input(s): PROT, ALBUMIN, AST, ALT, ALKPHOS, BILITOT in the last 168 hours. Hematology Recent Labs    Lab 01/19/19 1537 01/20/19 0402  WBC 10.4 9.2  RBC 4.57 4.14  HGB 9.5* 8.7*  HCT 31.9* 27.4*  MCV 69.8* 66.2*  MCH 20.8* 21.0*  MCHC 29.8* 31.8  RDW 16.7* 16.8*  PLT 234 185   Cardiac EnzymesNo results for input(s): TROPONINI in the last 168 hours. No results for input(s): TROPIPOC in the last 168 hours.  BNPNo results for input(s): BNP, PROBNP in the last 168 hours.  DDimer No results for input(s): DDIMER in the last 168 hours.  Radiology/Studies:  DG Chest 2 View  Result Date: 01/19/2019 IMPRESSION: No active cardiopulmonary disease. Electronically Signed   By: Lajean Manes M.D.   On: 01/19/2019 15:50   CT Angio Chest PE W and/or Wo Contrast  Result Date: 01/19/2019 IMPRESSION: 1. No evidence for pulmonary embolus or other acute intrathoracic process. 2. 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 There is a bypass graft from the left common carotid artery to the left subclavian artery. The visualized portions of the left subclavian artery are grossly patent beyond the level of the anastomosis. Overall, this is stable since 2017. Aortic Atherosclerosis (ICD10-I70.0). Electronically Signed   By: Constance Holster M.D.   On: 01/19/2019 19:13        HEAR Score (for undifferentiated chest pain):  HEAR Score: 5   Assessment and Plan:   1. CAD involving the native coronary arteries with stable angina/dyspnea: -Currently, chest pain free -High sensitivity troponin 21 with a delta of 21, not consistent with ACS -Likely in the setting of poorly controlled BP -Escalate amlodipine to 10 mg daily and metoprolol to 50 mg for added antianginal effect  -Remains on ASA, Plavix, amlodipine, Imdur, Toprol XL, and Crestor  -Headaches are unchanged both on and off Imdur, in this setting we will continue this -Stop heparin gtt -Place on subcutaneous heparin for VTE PPX   2. HFrEF secondary to ICM: -Prior LVSF of 45-50% in 2016 with  subsequent normalization of LVSF by echin in 10/2015 with most recent echo from 02/2018 demonstrating an EF of 60-65% -Continue Toprol XL and losartan -She does not appear volume overloaded and is not requiring a standing diuretic   3. HTN: -Blood pressure has ranged from the 373S to 287G systolic since admission  -Increase amlodipine to 10 mg daily and Toprol XL to 50 mg daily (will give an extra 5 mg today as well as 25 mg of  metoprolol as she has already had PTA 5 mg and 25 mg respectively) -Continue losartan and Imdur -Could titrate Imdur if needed -Monitor with titration   4. HLD: -LDL of 31 from 06/2018 -Crestor   5. Prior CVA: -Remains on ASA, Plavix and Crestor  -Followed by neurology   6. CKD stage III: -Stable -Monitor following CTA chest in the ED  7. Microcytic anemia: -Stable, at her ~ baseline -Cannot exclude her underlying anemia playing a role in her recurrent symptoms with known CAD as outlined above  8. Hypokalemia: -Replete to goal of 4.0 -Check magnesium with recommendation to replete to goal of 2.0 as indicated    For questions or updates, please contact [place] Please consult [email or web address] for contact info under Cardiology/STEMI.   Signed, Christell Faith, PA-C [place] Pager: [phone] 01/20/2019, 11:04 AM

## 2019-01-20 NOTE — Progress Notes (Addendum)
PROGRESS NOTE    [name]  BZJ:[id number] DOB: 19-Aug-1970 DOA: 01/19/2019 PCP: [name], MD      Assessment & Plan:   Active Problems:   Chest pain   Chest pain: r/o acute coronary syndrome with history of CAD s/p PCI and stent. Troponins are elevated. Continue on aspirin, plavix, imdur. Morphine & nitro prn for pain. Continue on tele. Cardio consulted  Hypokalemia: KCl repleated. Will continue to monitor   DM2: continue on SSI w/ accuchecks  HLD: continue on statin  Hypothyroidism: continue synthroid   Hypertension: uncontrolled. Continue metoprolol, amlodipine, & losartan. Hydralazine prn  Peripheral neuropathy: continue on gabapentin  Depression: severity unknown. Continue on home dose of bupropion, venlafaxine  Likely IDA: will check iron panel. No need for a transfusion at this time. Will continue to monitor    DVT prophylaxis: IV heparin drip Code Status: full  Family Communication:  Disposition Plan:    Consultants:   cardio   Procedures:    Antimicrobials: n/a   Subjective: Pt c/o chest pain  Objective: Vitals:   01/19/19 2045 01/19/19 2158 01/20/19 0500 01/20/19 0613  BP:  (!) 149/89 (!) 168/89 (!) 187/88  Pulse: 81 76 68 62  Resp: (!) 23 18 20 20   Temp:    98.7 F (37.1 C)  TempSrc:    Oral  SpO2: 100% 100% 99% 100%  Weight:    69.2 kg  Height:    5\' 2"  (1.575 m)    Intake/Output Summary (Last 24 hours) at 01/20/2019 0741 Last data filed at 01/20/2019 0618 Gross per 24 hour  Intake --  Output 800 ml  Net -800 ml   Filed Weights   01/19/19 1517 01/20/19 0613  Weight: 72.6 kg 69.2 kg    Examination:  General exam: Appears calm and comfortable  Respiratory system: Clear to auscultation. Respiratory effort normal. Cardiovascular system: S1 & S2 +. No rubs, gallops or clicks. No pedal edema. Gastrointestinal system: Abdomen is nondistended, soft and nontender. Normal bowel sounds heard. Central  nervous system: Alert and oriented. Moves all 4 extremities Psychiatry: Judgement and insight appear normal. Mood & affect appropriate.     Data Reviewed: I have personally reviewed following labs and imaging studies  CBC: Recent Labs  Lab 01/19/19 1537 01/20/19 0402  WBC 10.4 9.2  HGB 9.5* 8.7*  HCT 31.9* 27.4*  MCV 69.8* 66.2*  PLT 234 017   Basic Metabolic Panel: Recent Labs  Lab 01/19/19 1537  NA 140  K 3.1*  CL 105  CO2 24  GLUCOSE 94  BUN 17  CREATININE 1.74*  CALCIUM 8.1*   GFR: Estimated Creatinine Clearance: 35.2 mL/min (A) (by C-G formula based on SCr of 1.74 mg/dL (H)). Liver Function Tests: No results for input(s): AST, ALT, ALKPHOS, BILITOT, PROT, ALBUMIN in the last 168 hours. No results for input(s): LIPASE, AMYLASE in the last 168 hours. No results for input(s): AMMONIA in the last 168 hours. Coagulation Profile: Recent Labs  Lab 01/20/19 0402  INR 1.1   Cardiac Enzymes: No results for input(s): CKTOTAL, CKMB, CKMBINDEX, TROPONINI in the last 168 hours. BNP (last 3 results) No results for input(s): PROBNP in the last 8760 hours. HbA1C: No results for input(s): HGBA1C in the last 72 hours. CBG: No results for input(s): GLUCAP in the last 168 hours. Lipid Profile: No results for input(s): CHOL, HDL, LDLCALC, TRIG, CHOLHDL, LDLDIRECT in the last 72 hours. Thyroid Function Tests: Recent Labs    01/20/19 0402  TSH  3.500   Anemia Panel: No results for input(s): VITAMINB12, FOLATE, FERRITIN, TIBC, IRON, RETICCTPCT in the last 72 hours. Sepsis Labs: No results for input(s): PROCALCITON, LATICACIDVEN in the last 168 hours.  Recent Results (from the past 240 hour(s))  Respiratory Panel by RT PCR (Flu A&B, Covid) - Nasopharyngeal Swab     Status: None   Collection Time: 01/19/19  8:36 PM   Specimen: Nasopharyngeal Swab  Result Value Ref Range Status   SARS Coronavirus 2 by RT PCR NEGATIVE NEGATIVE Final    Comment: (NOTE) SARS-CoV-2 target  nucleic acids are NOT DETECTED. The SARS-CoV-2 RNA is generally detectable in upper respiratoy specimens during the acute phase of infection. The lowest concentration of SARS-CoV-2 viral copies this assay can detect is 131 copies/mL. A negative result does not preclude SARS-Cov-2 infection and should not be used as the sole basis for treatment or other patient management decisions. A negative result may occur with  improper specimen collection/handling, submission of specimen other than nasopharyngeal swab, presence of viral mutation(s) within the areas targeted by this assay, and inadequate number of viral copies (<131 copies/mL). A negative result must be combined with clinical observations, patient history, and epidemiological information. The expected result is Negative. Fact Sheet for Patients:  PinkCheek.be Fact Sheet for Healthcare Providers:  GravelBags.it This test is not yet ap proved or cleared by the [place] FDA and  has been authorized for detection and/or diagnosis of SARS-CoV-2 by FDA under an Emergency Use Authorization (EUA). This EUA will remain  in effect (meaning this test can be used) for the duration of the COVID-19 declaration under Section 564(b)(1) of the Act, 21 U.S.C. section 360bbb-3(b)(1), unless the authorization is terminated or revoked sooner.    Influenza A by PCR NEGATIVE NEGATIVE Final   Influenza B by PCR NEGATIVE NEGATIVE Final    Comment: (NOTE) The Xpert Xpress SARS-CoV-2/FLU/RSV assay is intended as an aid in  the diagnosis of influenza from Nasopharyngeal swab specimens and  should not be used as a sole basis for treatment. Nasal washings and  aspirates are unacceptable for Xpert Xpress SARS-CoV-2/FLU/RSV  testing. Fact Sheet for Patients: PinkCheek.be Fact Sheet for Healthcare Providers: GravelBags.it This test is not  yet approved or cleared by the [place] FDA and  has been authorized for detection and/or diagnosis of SARS-CoV-2 by  FDA under an Emergency Use Authorization (EUA). This EUA will remain  in effect (meaning this test can be used) for the duration of the  Covid-19 declaration under Section 564(b)(1) of the Act, 21  U.S.C. section 360bbb-3(b)(1), unless the authorization is  terminated or revoked. Performed at [facility], [address] [place],  [address]          Radiology Studies: DG Chest 2 View  Result Date: 01/19/2019 CLINICAL DATA:  Pt c/o CP since Tuesday that is pressure like in nature and some SOB. Pt states called her MD and was advised to come to the ED. Hx of CHF, stage 3 kidney failure. EXAM: CHEST - 2 VIEW COMPARISON:  10/12/2018 FINDINGS: Cardiac silhouette is normal in size. No mediastinal or hilar masses. No evidence of adenopathy. Clear lungs.  No pleural effusion or pneumothorax. Skeletal structures are intact. IMPRESSION: No active cardiopulmonary disease. Electronically Signed   By: Lajean Manes M.D.   On: 01/19/2019 15:50   CT Angio Chest PE W and/or Wo Contrast  Result Date: 01/19/2019 CLINICAL DATA:  Shortness of breath EXAM: CT ANGIOGRAPHY CHEST WITH CONTRAST TECHNIQUE: Multidetector  CT imaging of the chest was performed using the standard protocol during bolus administration of intravenous contrast. Multiplanar CT image reconstructions and MIPs were obtained to evaluate the vascular anatomy. CONTRAST:  21mL OMNIPAQUE IOHEXOL 350 MG/ML SOLN COMPARISON:  05/31/2015 FINDINGS: Cardiovascular: Contrast injection is sufficient to demonstrate satisfactory opacification of the pulmonary arteries to the segmental level. There is no pulmonary embolus. The main pulmonary artery is within normal limits for size. There is no CT evidence of acute right heart strain. There are mild atherosclerotic changes of the visualized thoracic aorta. 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 There is a bypass graft from the left common carotid artery to the left subclavian artery. The visualized portions of the left subclavian artery are grossly patent beyond the level of the anastomosis. The appearance of this finding is similar to prior CT. Heart size is mildly enlarged. Coronary artery calcifications are noted. There is no significant pericardial effusion. Mediastinum/Nodes: --No mediastinal or hilar lymphadenopathy. --No axillary lymphadenopathy. --No supraclavicular lymphadenopathy. --the left hemi thyroid appears to be either surgically absent or atretic. --The esophagus is unremarkable Lungs/Pleura: No pulmonary nodules or masses. No pleural effusion or pneumothorax. No focal airspace consolidation. No focal pleural abnormality. Upper Abdomen: The patient appears to be status post prior gastric bypass. The patient is status post prior cholecystectomy. The upper abdomen is otherwise unremarkable. Musculoskeletal: No chest wall abnormality. No acute or significant osseous findings. Review of the MIP images confirms the above findings. IMPRESSION: 1. No evidence for pulmonary embolus or other acute intrathoracic process. 2. 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 There is a bypass graft from the left common carotid artery to the left subclavian artery. The visualized portions of the left subclavian artery are grossly patent beyond the level of the anastomosis. Overall, this is stable since 2017. Aortic Atherosclerosis (ICD10-I70.0). Electronically Signed   By: Constance Holster M.D.   On: 01/19/2019 19:13        Scheduled Meds: . amLODipine  5 mg Oral Daily  . buPROPion  150 mg Oral Daily  . clopidogrel  75 mg Oral Daily  . gabapentin  300 mg Oral BID  . insulin aspart  0-9 Units Subcutaneous TID WC  . isosorbide mononitrate  30 mg Oral Daily  .  levothyroxine  25 mcg Oral QAC breakfast  . losartan  25 mg Oral Daily  . metoprolol succinate  25 mg Oral Daily  . pantoprazole  40 mg Oral BID  . rosuvastatin  40 mg Oral Daily  . venlafaxine XR  75 mg Oral Q breakfast   Continuous Infusions: . sodium chloride 75 mL/hr at 01/19/19 2231  . heparin 850 Units/hr (01/20/19 0512)     LOS: 0 days    Time spent: 30 mins    Wyvonnia Dusky, MD [facility] Pager [phone]  If 7PM-7AM, please contact night-coverage [email or web address] Password [facility] 01/20/2019, 7:41 AM

## 2019-01-20 NOTE — ED Notes (Signed)
Transport to floor room 238.AS 

## 2019-01-20 NOTE — Consult Note (Signed)
[facility] for Heparin  Indication: unstable angina  Allergies  Allergen Reactions  . Lipitor [Atorvastatin] Other (See Comments)    Leg pains  . Tramadol Other (See Comments)    Mouth feels like it's on fire  . Lactose Intolerance (Gi) Nausea Only    Patient Measurements: Height: 5\' 2"  (157.5 cm) Weight: 160 lb (72.6 kg) IBW/kg (Calculated) : 50.1 Heparin Dosing Weight: 65.6 kg   Vital Signs: BP: 149/89 (12/17 2158) Pulse Rate: 76 (12/17 2158)  Labs: Recent Labs    01/19/19 1537 01/19/19 1732 01/20/19 0402  HGB 9.5*  --  8.7*  HCT 31.9*  --  27.4*  PLT 234  --  185  APTT  --   --  73*  LABPROT  --   --  13.9  INR  --   --  1.1  HEPARINUNFRC  --   --  0.26*  CREATININE 1.74*  --   --   TROPONINIHS 21* 21*  --     Estimated Creatinine Clearance: 36.1 mL/min (A) (by C-G formula based on SCr of 1.74 mg/dL (H)).   Medications:  Confirmed no PTA anticoagulants  Assessment: Pharmacy consulted for heparin dosing in a patient who presented with unstable angina.  Heparin DW: 65.6 kg   12/17 Heparin infusion started @ 750 units/hr   Goal of Therapy:  Heparin level 0.3-0.7 units/ml Monitor platelets by anticoagulation protocol: Yes   Plan:  Baseline labs have been ordered  12/18 @ 0402 HL: 0.26. Level is subtherapeutic.  Will order 800 unit bolus and increase heparin infusion to 850 units/hr.   Recheck anti-Xa level in 6 hours and daily while on heparin, per protocol Continue to monitor H&H and platelets  Pernell Dupre, PharmD, BCPS Clinical Pharmacist 01/20/2019 4:54 AM

## 2019-01-21 DIAGNOSIS — I2510 Atherosclerotic heart disease of native coronary artery without angina pectoris: Secondary | ICD-10-CM

## 2019-01-21 DIAGNOSIS — E7800 Pure hypercholesterolemia, unspecified: Secondary | ICD-10-CM

## 2019-01-21 DIAGNOSIS — I160 Hypertensive urgency: Secondary | ICD-10-CM

## 2019-01-21 DIAGNOSIS — R079 Chest pain, unspecified: Secondary | ICD-10-CM

## 2019-01-21 DIAGNOSIS — I200 Unstable angina: Secondary | ICD-10-CM

## 2019-01-21 LAB — BASIC METABOLIC PANEL
Anion gap: 8 (ref 5–15)
BUN: 23 mg/dL — ABNORMAL HIGH (ref 6–20)
CO2: 25 mmol/L (ref 22–32)
Calcium: 8.3 mg/dL — ABNORMAL LOW (ref 8.9–10.3)
Chloride: 108 mmol/L (ref 98–111)
Creatinine, Ser: 1.55 mg/dL — ABNORMAL HIGH (ref 0.44–1.00)
GFR calc Af Amer: 45 mL/min — ABNORMAL LOW (ref >60)
GFR calc non Af Amer: 39 mL/min — ABNORMAL LOW (ref >60)
Glucose, Bld: 70 mg/dL (ref 70–99)
Potassium: 3.6 mmol/L (ref 3.5–5.1)
Sodium: 141 mmol/L (ref 135–145)

## 2019-01-21 LAB — GLUCOSE, CAPILLARY
Glucose-Capillary: 76 mg/dL (ref 70–99)
Glucose-Capillary: 66 mg/dL — ABNORMAL LOW (ref 70–99)
Glucose-Capillary: 105 mg/dL — ABNORMAL HIGH (ref 70–99)
Glucose-Capillary: 158 mg/dL — ABNORMAL HIGH (ref 70–99)

## 2019-01-21 LAB — OCCULT BLOOD X 1 CARD TO LAB, STOOL: Fecal Occult Bld: NEGATIVE

## 2019-01-21 LAB — TROPONIN I (HIGH SENSITIVITY): Troponin I (High Sensitivity): 11 ng/L (ref <18)

## 2019-01-21 LAB — CBC
HCT: 27.2 % — ABNORMAL LOW (ref 36.0–46.0)
Hemoglobin: 8.2 g/dL — ABNORMAL LOW (ref 12.0–15.0)
MCH: 20.9 pg — ABNORMAL LOW (ref 26.0–34.0)
MCHC: 30.1 g/dL (ref 30.0–36.0)
MCV: 69.4 fL — ABNORMAL LOW (ref 80.0–100.0)
Platelets: 159 10*3/uL (ref 150–400)
RBC: 3.92 MIL/uL (ref 3.87–5.11)
RDW: 16.6 % — ABNORMAL HIGH (ref 11.5–15.5)
WBC: 8 10*3/uL (ref 4.0–10.5)
nRBC: 0 % (ref 0.0–0.2)

## 2019-01-21 LAB — IRON AND TIBC
Iron: 23 ug/dL — ABNORMAL LOW (ref 28–170)
Saturation Ratios: 10 % — ABNORMAL LOW (ref 10.4–31.8)
TIBC: 243 ug/dL — ABNORMAL LOW (ref 250–450)
UIBC: 220 ug/dL

## 2019-01-21 LAB — FERRITIN: Ferritin: 6 ng/mL — ABNORMAL LOW (ref 11–307)

## 2019-01-21 MED ORDER — POTASSIUM CHLORIDE CRYS ER 10 MEQ PO TBCR
EXTENDED_RELEASE_TABLET | ORAL | Status: AC
  Administered 2019-01-21: 10 meq
  Filled 2019-01-21: qty 3

## 2019-01-21 MED ORDER — POTASSIUM CHLORIDE CRYS ER 10 MEQ PO TBCR
10.0000 meq | EXTENDED_RELEASE_TABLET | Freq: Every day | ORAL | Status: DC
  Administered 2019-01-22 – 2019-01-23 (×2): 10 meq via ORAL
  Filled 2019-01-21 (×2): qty 1

## 2019-01-21 MED ORDER — POTASSIUM CHLORIDE CRYS ER 20 MEQ PO TBCR
30.0000 meq | EXTENDED_RELEASE_TABLET | Freq: Once | ORAL | Status: AC
  Administered 2019-01-21: 30 meq via ORAL

## 2019-01-21 MED ORDER — CARVEDILOL 12.5 MG PO TABS
12.5000 mg | ORAL_TABLET | Freq: Two times a day (BID) | ORAL | Status: DC
  Administered 2019-01-21 – 2019-01-23 (×4): 12.5 mg via ORAL
  Filled 2019-01-21 (×4): qty 1

## 2019-01-21 MED ORDER — POTASSIUM CHLORIDE CRYS ER 10 MEQ PO TBCR
EXTENDED_RELEASE_TABLET | ORAL | Status: AC
  Filled 2019-01-21: qty 1

## 2019-01-21 MED ORDER — FUROSEMIDE 20 MG PO TABS
20.0000 mg | ORAL_TABLET | Freq: Every day | ORAL | Status: DC
  Administered 2019-01-21 – 2019-01-23 (×3): 20 mg via ORAL
  Filled 2019-01-21 (×3): qty 1

## 2019-01-21 MED ORDER — ISOSORBIDE MONONITRATE ER 60 MG PO TB24
60.0000 mg | ORAL_TABLET | Freq: Every day | ORAL | Status: DC
  Administered 2019-01-22 – 2019-01-23 (×2): 60 mg via ORAL
  Filled 2019-01-21 (×2): qty 1

## 2019-01-21 NOTE — Progress Notes (Signed)
   ReDS vest reading 52%. Discussed with rounding cardiologist with addition of low dose lasix and potassium supplementation to continue at discharge pending AM BMET and monitoring of renal function as an outpatient.   Signed, Arvil Chaco, PA-C 01/21/2019, 5:51 PM Pager [phone]

## 2019-01-21 NOTE — Plan of Care (Signed)
  Problem: Education: Goal: Knowledge of General Education information will improve Description: Including pain rating scale, medication(s)/side effects and non-pharmacologic comfort measures Outcome: Progressing   Problem: Pain Managment: Goal: General experience of comfort will improve Outcome: Progressing   

## 2019-01-21 NOTE — Progress Notes (Signed)
PROGRESS NOTE    [name]  KGU:[id number] DOB: May 20, 1970 DOA: 01/19/2019 PCP: [name], MD      Assessment & Plan:   Active Problems:   Chest pain   Chest pain: likely secondary to demand ischemia. No chest pain today. Troponins are elevated. Continue on aspirin, plavix, imdur. Morphine & nitro prn for pain. Continue on tele. Cardio following and recs apprec  Hypokalemia: WNL today. Will continue to monitor   AKI: baseline Cr is unknown. Cr is trending down today. Will continue to monitor   DM2: continue on SSI w/ accuchecks  HLD: continue on statin  Hypothyroidism: continue synthroid   Hypertension: uncontrolled. Continue on amlodipine, imdur & losartan. D/c metoprolol and start carvedilol as per cardio. Hydralazine prn  Peripheral neuropathy: continue on gabapentin  Depression: severity unknown. Continue on home dose of bupropion, venlafaxine  ACD: etiology unclear. No need for a transfusion at this time. Will continue to monitor    DVT prophylaxis: heparin Code Status: full  Family Communication:  Disposition Plan:    Consultants:   cardio   Procedures:    Antimicrobials: n/a   Subjective: Pt c/o chest pain  Objective: Vitals:   01/20/19 1532 01/20/19 1945 01/20/19 2144 01/21/19 0400  BP: 136/88 (!) 166/87 (!) 144/76 (!) 168/87  Pulse: 66 61 61 60  Resp: 18 18  16   Temp: 97.7 F (36.5 C) 97.8 F (36.6 C)  98.2 F (36.8 C)  TempSrc: Oral Axillary  Oral  SpO2: 100% 100%  99%  Weight:    69.2 kg  Height:        Intake/Output Summary (Last 24 hours) at 01/21/2019 0736 Last data filed at 01/21/2019 0402 Gross per 24 hour  Intake --  Output 1700 ml  Net -1700 ml   Filed Weights   01/19/19 1517 01/20/19 0613 01/21/19 0400  Weight: 72.6 kg 69.2 kg 69.2 kg    Examination:  General exam: Appears calm and comfortable  Respiratory system: Clear to auscultation. Respiratory effort normal. No wheezes,  rhonchi Cardiovascular system: S1 & S2 +. No rubs, gallops or clicks.  Gastrointestinal system: Abdomen is nondistended, soft and nontender. Normal bowel sounds heard. Central nervous system: Alert and oriented. Moves all 4 extremities Psychiatry: Judgement and insight appear normal. Mood & affect appropriate.     Data Reviewed: I have personally reviewed following labs and imaging studies  CBC: Recent Labs  Lab 01/19/19 1537 01/20/19 0402 01/21/19 0454  WBC 10.4 9.2 8.0  HGB 9.5* 8.7* 8.2*  HCT 31.9* 27.4* 27.2*  MCV 69.8* 66.2* 69.4*  PLT 234 185 628   Basic Metabolic Panel: Recent Labs  Lab 01/19/19 1537 01/20/19 0826 01/21/19 0454  NA 140  --  141  K 3.1*  --  3.6  CL 105  --  108  CO2 24  --  25  GLUCOSE 94  --  70  BUN 17  --  23*  CREATININE 1.74* 1.58* 1.55*  CALCIUM 8.1*  --  8.3*  MG  --  1.3*  --    GFR: Estimated Creatinine Clearance: 39.6 mL/min (A) (by C-G formula based on SCr of 1.55 mg/dL (H)). Liver Function Tests: No results for input(s): AST, ALT, ALKPHOS, BILITOT, PROT, ALBUMIN in the last 168 hours. No results for input(s): LIPASE, AMYLASE in the last 168 hours. No results for input(s): AMMONIA in the last 168 hours. Coagulation Profile: Recent Labs  Lab 01/20/19 0402  INR 1.1   Cardiac Enzymes: No  results for input(s): CKTOTAL, CKMB, CKMBINDEX, TROPONINI in the last 168 hours. BNP (last 3 results) No results for input(s): PROBNP in the last 8760 hours. HbA1C: Recent Labs    01/20/19 0402  HGBA1C 5.9*   CBG: Recent Labs  Lab 01/20/19 0808 01/20/19 0907 01/20/19 1154 01/20/19 1639 01/20/19 2112  GLUCAP 68* 104* 74 84 68*   Lipid Profile: No results for input(s): CHOL, HDL, LDLCALC, TRIG, CHOLHDL, LDLDIRECT in the last 72 hours. Thyroid Function Tests: Recent Labs    01/20/19 0402  TSH 3.500   Anemia Panel: Recent Labs    01/21/19 0454  FERRITIN 6*  TIBC 243*  IRON 23*   Sepsis Labs: No results for input(s):  PROCALCITON, LATICACIDVEN in the last 168 hours.  Recent Results (from the past 240 hour(s))  Respiratory Panel by RT PCR (Flu A&B, Covid) - Nasopharyngeal Swab     Status: None   Collection Time: 01/19/19  8:36 PM   Specimen: Nasopharyngeal Swab  Result Value Ref Range Status   SARS Coronavirus 2 by RT PCR NEGATIVE NEGATIVE Final    Comment: (NOTE) SARS-CoV-2 target nucleic acids are NOT DETECTED. The SARS-CoV-2 RNA is generally detectable in upper respiratoy specimens during the acute phase of infection. The lowest concentration of SARS-CoV-2 viral copies this assay can detect is 131 copies/mL. A negative result does not preclude SARS-Cov-2 infection and should not be used as the sole basis for treatment or other patient management decisions. A negative result may occur with  improper specimen collection/handling, submission of specimen other than nasopharyngeal swab, presence of viral mutation(s) within the areas targeted by this assay, and inadequate number of viral copies (<131 copies/mL). A negative result must be combined with clinical observations, patient history, and epidemiological information. The expected result is Negative. Fact Sheet for Patients:  PinkCheek.be Fact Sheet for Healthcare Providers:  GravelBags.it This test is not yet ap proved or cleared by the [place] FDA and  has been authorized for detection and/or diagnosis of SARS-CoV-2 by FDA under an Emergency Use Authorization (EUA). This EUA will remain  in effect (meaning this test can be used) for the duration of the COVID-19 declaration under Section 564(b)(1) of the Act, 21 U.S.C. section 360bbb-3(b)(1), unless the authorization is terminated or revoked sooner.    Influenza A by PCR NEGATIVE NEGATIVE Final   Influenza B by PCR NEGATIVE NEGATIVE Final    Comment: (NOTE) The Xpert Xpress SARS-CoV-2/FLU/RSV assay is intended as an aid in  the  diagnosis of influenza from Nasopharyngeal swab specimens and  should not be used as a sole basis for treatment. Nasal washings and  aspirates are unacceptable for Xpert Xpress SARS-CoV-2/FLU/RSV  testing. Fact Sheet for Patients: PinkCheek.be Fact Sheet for Healthcare Providers: GravelBags.it This test is not yet approved or cleared by the [place] FDA and  has been authorized for detection and/or diagnosis of SARS-CoV-2 by  FDA under an Emergency Use Authorization (EUA). This EUA will remain  in effect (meaning this test can be used) for the duration of the  Covid-19 declaration under Section 564(b)(1) of the Act, 21  U.S.C. section 360bbb-3(b)(1), unless the authorization is  terminated or revoked. Performed at [facility], [address] [place], [place] [address]          Radiology Studies: DG Chest 2 View  Result Date: 01/19/2019 CLINICAL DATA:  Pt c/o CP since Tuesday that is pressure like in nature and some SOB. Pt states called her MD and was advised  to come to the ED. Hx of CHF, stage 3 kidney failure. EXAM: CHEST - 2 VIEW COMPARISON:  10/12/2018 FINDINGS: Cardiac silhouette is normal in size. No mediastinal or hilar masses. No evidence of adenopathy. Clear lungs.  No pleural effusion or pneumothorax. Skeletal structures are intact. IMPRESSION: No active cardiopulmonary disease. Electronically Signed   By: Lajean Manes M.D.   On: 01/19/2019 15:50   CT Angio Chest PE W and/or Wo Contrast  Result Date: 01/19/2019 CLINICAL DATA:  Shortness of breath EXAM: CT ANGIOGRAPHY CHEST WITH CONTRAST TECHNIQUE: Multidetector CT imaging of the chest was performed using the standard protocol during bolus administration of intravenous contrast. Multiplanar CT image reconstructions and MIPs were obtained to evaluate the vascular anatomy. CONTRAST:  77mL OMNIPAQUE IOHEXOL 350 MG/ML SOLN COMPARISON:  05/31/2015  FINDINGS: Cardiovascular: Contrast injection is sufficient to demonstrate satisfactory opacification of the pulmonary arteries to the segmental level. There is no pulmonary embolus. The main pulmonary artery is within normal limits for size. There is no CT evidence of acute right heart strain. There are mild atherosclerotic changes of the visualized thoracic aorta. 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 There is a bypass graft from the left common carotid artery to the left subclavian artery. The visualized portions of the left subclavian artery are grossly patent beyond the level of the anastomosis. The appearance of this finding is similar to prior CT. Heart size is mildly enlarged. Coronary artery calcifications are noted. There is no significant pericardial effusion. Mediastinum/Nodes: --No mediastinal or hilar lymphadenopathy. --No axillary lymphadenopathy. --No supraclavicular lymphadenopathy. --the left hemi thyroid appears to be either surgically absent or atretic. --The esophagus is unremarkable Lungs/Pleura: No pulmonary nodules or masses. No pleural effusion or pneumothorax. No focal airspace consolidation. No focal pleural abnormality. Upper Abdomen: The patient appears to be status post prior gastric bypass. The patient is status post prior cholecystectomy. The upper abdomen is otherwise unremarkable. Musculoskeletal: No chest wall abnormality. No acute or significant osseous findings. Review of the MIP images confirms the above findings. IMPRESSION: 1. No evidence for pulmonary embolus or other acute intrathoracic process. 2. 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 There is a bypass graft from the left common carotid artery to the left subclavian artery. The visualized portions of the left subclavian artery are grossly patent beyond the level of the anastomosis. Overall, this is stable since  2017. Aortic Atherosclerosis (ICD10-I70.0). Electronically Signed   By: Constance Holster M.D.   On: 01/19/2019 19:13        Scheduled Meds: . amLODipine  10 mg Oral Daily  . aspirin EC  81 mg Oral Daily  . buPROPion  150 mg Oral Daily  . clopidogrel  75 mg Oral Daily  . gabapentin  300 mg Oral BID  . heparin  5,000 Units Subcutaneous Q8H  . insulin aspart  0-9 Units Subcutaneous TID WC  . isosorbide mononitrate  30 mg Oral Daily  . levothyroxine  25 mcg Oral QAC breakfast  . losartan  25 mg Oral Daily  . metoprolol succinate  50 mg Oral Daily  . pantoprazole  40 mg Oral BID  . rosuvastatin  40 mg Oral Daily  . venlafaxine XR  75 mg Oral Q breakfast   Continuous Infusions:    LOS: 1 day    Time spent: 30 mins    Wyvonnia Dusky, MD [facility] Pager [phone]  If 7PM-7AM,  please contact night-coverage [email or web address] Password [facility] 01/21/2019, 7:36 AM

## 2019-01-21 NOTE — Progress Notes (Signed)
Progress Note  Patient Name: [name] Date of Encounter: 01/21/2019  Primary Cardiologist: Ida Rogue, MD   Subjective   Reports her last episode of chest pain last night.  Stated this episode lasted a few minutes with pain located in the center of her chest that then radiated down underneath her rib cage.  She denied associated symptoms with the chest discomfort.  She also reported several episodes of racing heart rate and shortness of breath overnight and SOB today.  She does not feel that she would be able to lay flat due to shortness of breath.  Inpatient Medications    Scheduled Meds: . amLODipine  10 mg Oral Daily  . aspirin EC  81 mg Oral Daily  . buPROPion  150 mg Oral Daily  . clopidogrel  75 mg Oral Daily  . gabapentin  300 mg Oral BID  . heparin  5,000 Units Subcutaneous Q8H  . insulin aspart  0-9 Units Subcutaneous TID WC  . isosorbide mononitrate  30 mg Oral Daily  . levothyroxine  25 mcg Oral QAC breakfast  . losartan  25 mg Oral Daily  . metoprolol succinate  50 mg Oral Daily  . pantoprazole  40 mg Oral BID  . rosuvastatin  40 mg Oral Daily  . venlafaxine XR  75 mg Oral Q breakfast   Continuous Infusions:  PRN Meds: acetaminophen, ALPRAZolam, hydrALAZINE, morphine injection, nitroGLYCERIN, ondansetron (ZOFRAN) IV, promethazine, zolpidem   Vital Signs    Vitals:   01/20/19 1945 01/20/19 2144 01/21/19 0400 01/21/19 0740  BP: (!) 166/87 (!) 144/76 (!) 168/87 (!) 154/68  Pulse: 61 61 60 (!) 56  Resp: 18  16 16   Temp: 97.8 F (36.6 C)  98.2 F (36.8 C) 98.5 F (36.9 C)  TempSrc: Axillary  Oral Oral  SpO2: 100%  99% 99%  Weight:   69.2 kg   Height:        Intake/Output Summary (Last 24 hours) at 01/21/2019 0853 Last data filed at 01/21/2019 0402 Gross per 24 hour  Intake --  Output 1000 ml  Net -1000 ml   Last 3 Weights 01/21/2019 01/20/2019 01/19/2019  Weight (lbs) 152 lb 8 oz 152 lb 8 oz 160 lb  Weight (kg) 69.174 kg 69.174  kg 72.576 kg  Some encounter information is confidential and restricted. Go to Review Flowsheets activity to see all data.      Telemetry    NSR, PVCs, 50-70s- Personally Reviewed  ECG    No new tracings - Personally Reviewed  Physical Exam   GEN: No acute distress.   Neck: No JVD Cardiac: RRR, no murmurs, rubs, or gallops.  Respiratory: Trace bibasilar crackles at right lower base otherwise CTAB GI: Soft, nontender, non-distended  MS: No edema; No deformity. Neuro:  Nonfocal  Psych: Normal affect   Labs    High Sensitivity Troponin:   Recent Labs  Lab 01/19/19 1537 01/19/19 1732 01/20/19 0826 01/21/19 0454  TROPONINIHS 21* 21* 16 11      Cardiac EnzymesNo results for input(s): TROPONINI in the last 168 hours. No results for input(s): TROPIPOC in the last 168 hours.   Chemistry Recent Labs  Lab 01/19/19 1537 01/20/19 0826 01/21/19 0454  NA 140  --  141  K 3.1*  --  3.6  CL 105  --  108  CO2 24  --  25  GLUCOSE 94  --  70  BUN 17  --  23*  CREATININE 1.74* 1.58* 1.55*  CALCIUM 8.1*  --  8.3*  GFRNONAA 34* 38* 39*  GFRAA 39* 44* 45*  ANIONGAP 11  --  8     Hematology Recent Labs  Lab 01/19/19 1537 01/20/19 0402 01/21/19 0454  WBC 10.4 9.2 8.0  RBC 4.57 4.14 3.92  HGB 9.5* 8.7* 8.2*  HCT 31.9* 27.4* 27.2*  MCV 69.8* 66.2* 69.4*  MCH 20.8* 21.0* 20.9*  MCHC 29.8* 31.8 30.1  RDW 16.7* 16.8* 16.6*  PLT 234 185 159    BNPNo results for input(s): BNP, PROBNP in the last 168 hours.   DDimer No results for input(s): DDIMER in the last 168 hours.   Radiology    DG Chest 2 View  Result Date: 01/19/2019 CLINICAL DATA:  Pt c/o CP since Tuesday that is pressure like in nature and some SOB. Pt states called her MD and was advised to come to the ED. Hx of CHF, stage 3 kidney failure. EXAM: CHEST - 2 VIEW COMPARISON:  10/12/2018 FINDINGS: Cardiac silhouette is normal in size. No mediastinal or hilar masses. No evidence of adenopathy. Clear lungs.  No  pleural effusion or pneumothorax. Skeletal structures are intact. IMPRESSION: No active cardiopulmonary disease. Electronically Signed   By: Lajean Manes M.D.   On: 01/19/2019 15:50   CT Angio Chest PE W and/or Wo Contrast  Result Date: 01/19/2019 CLINICAL DATA:  Shortness of breath EXAM: CT ANGIOGRAPHY CHEST WITH CONTRAST TECHNIQUE: Multidetector CT imaging of the chest was performed using the standard protocol during bolus administration of intravenous contrast. Multiplanar CT image reconstructions and MIPs were obtained to evaluate the vascular anatomy. CONTRAST:  30mL OMNIPAQUE IOHEXOL 350 MG/ML SOLN COMPARISON:  05/31/2015 FINDINGS: Cardiovascular: Contrast injection is sufficient to demonstrate satisfactory opacification of the pulmonary arteries to the segmental level. There is no pulmonary embolus. The main pulmonary artery is within normal limits for size. There is no CT evidence of acute right heart strain. There are mild atherosclerotic changes of the visualized thoracic aorta. 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 There is a bypass graft from the left common carotid artery to the left subclavian artery. The visualized portions of the left subclavian artery are grossly patent beyond the level of the anastomosis. The appearance of this finding is similar to prior CT. Heart size is mildly enlarged. Coronary artery calcifications are noted. There is no significant pericardial effusion. Mediastinum/Nodes: --No mediastinal or hilar lymphadenopathy. --No axillary lymphadenopathy. --No supraclavicular lymphadenopathy. --the left hemi thyroid appears to be either surgically absent or atretic. --The esophagus is unremarkable Lungs/Pleura: No pulmonary nodules or masses. No pleural effusion or pneumothorax. No focal airspace consolidation. No focal pleural abnormality. Upper Abdomen: The patient appears to be status post prior gastric bypass. The  patient is status post prior cholecystectomy. The upper abdomen is otherwise unremarkable. Musculoskeletal: No chest wall abnormality. No acute or significant osseous findings. Review of the MIP images confirms the above findings. IMPRESSION: 1. No evidence for pulmonary embolus or other acute intrathoracic process. 2. 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 There is a bypass graft from the left common carotid artery to the left subclavian artery. The visualized portions of the left subclavian artery are grossly patent beyond the level of the anastomosis. Overall, this is stable since 2017. Aortic Atherosclerosis (ICD10-I70.0). Electronically Signed   By: Constance Holster M.D.   On: 01/19/2019 19:13    Cardiac Studies   2D Echo 02/2018: -  Left ventricle: The cavity size was normal. Systolic function was normal. The estimated ejection fraction was in the range of 60% to 65%. Wall motion was normal; there were no regional wall motion abnormalities. Left ventricular diastolic function parameters were normal. - Left atrium: The atrium was normal in size. - Right ventricle: Systolic function was normal. - Pulmonary arteries: Systolic pressure was within the normal range.  Impressions:  - No cardiac source of emboli was indentified. __________  LHC 12/2017:  Non-stenotic 1st Diag lesion was previously treated.  Ost 1st Diag lesion is 40% stenosed.  Mid LAD-2 lesion is 5% stenosed.  Ost 2nd Diag to 2nd Diag lesion is 95% stenosed.  Scoring balloon angioplasty balloon angioplasty was performed.  Dist LAD lesion is 80% stenosed.  Mid LAD-1 lesion is 30% stenosed.  Prox Cx lesion is 40% stenosed.  Ost RCA to Prox RCA lesion is 40% stenosed.  1. Patent first diagonal and mid LAD stent with no significant restenosis. Significant disease affecting the distal LAD close to the apex as well as recurrent restenosis in the  ostial second diagonal which was treated in the past with angioplasty most recently in August with cutting balloon angioplasty. 2. Normal left ventricular end-diastolic pressure. Left ventricular angiography was not performed.  Recommendations: The patient might have anginal symptoms related to disease in the distal LAD as well as ostial second diagonal. Unfortunately, the LAD disease is too distal to stent and the ostial diagonal has been treated twice with balloon angioplasty with recurrent restenosis. This is not an optimal location to place a stent as it will pinch the mid LAD. The only way to treat this with stent would be to perform bifurcation stenting including the mid LAD. However, the mid LAD disease does not seem to be obstructive and I feel that the long-term risks outweigh the benefit. I recommend continuing medical therapy.  Patient Profile     48 y.o. female with a history of CAD s/p prior interventions as detailed in HPI, HFrEF secondary to ICM (EF normalization and 02/2018 60-65%), CKD stage III, prior CVAs, left carotid to subclavian artery bypass with subclavian artery ligation, aortic arch aneurysm, anemia, obesity status post gastric bypass, bipolar disorder, DM with polyneuropathy, migraine disorder, hypertension, hyperlipidemia, and frequent falls/syncope/near syncope, and who is being seen today for the evaluation of chest pain and elevated BP.  Assessment & Plan   CAD involving the native coronary arteries with stable angina/dyspnea --Reports most recent chest pain occurred last night 12/18.  Currently chest pain-free.  High-sensitivity troponin peaked 21 and downtrending and not consistent with ACS. CTA without evidence of PE. Likely supply demand ischemia in the setting of poorly controlled BP. Continue increased amlodipine 10mg  and metoprolol 50mg  for added antianginal effect with DAPT, Imdur, and Crestor. Given ongoing CP and elevated pressures this AM, increased  Imdur to 60mg  daily today. Per notes, headaches are felt not related to long acting nitrate therapy. No indication for heparin gtt at this time. Consider discontinuing Plavix if workup of hemoglobin shows evidence of bleed as below and with repeat FOBT pending.   HFrEF 2/2 ICM --Prior LVSF 45-50%   60-65%. Continue BB and losartan. She has previously not required a diuretic with ReDS vest pending this AM to reassess volume status given crackles heard on exam today and ensure she is not requiring a low dose diuretic at discharge. Continues to note periodic SOB today and states she cannot lay flat in the bed. Ambulates without difficulty. Weight stable.  CXR was without active dz.  Anemia --FOBT 12/18 negative with pending FOBT for today. Hemoglobin 9.5  8.7  8.2. Iron, ferritin,TIBC low. Consider as contributing also to the above sx and Tn. Further workup per IM. If determine bleed on workup, recommend bringing neurology on board to discuss ongoing DAPT and consider discontinuation of Plavix.   HTN --BP still not well controlled on current medications. Increased Imdur today to 60mg  daily. Continue Losartan.   HLD --LDL at goal. Continue statin.  Prior CVA --Continue DAPT with ASA and Plavix, as well as statin. Recommend more optimal BP control. Followed by neurology.  CKDIII --Stable. Monitoring following contrast exposure for chest CTA in ED with most recent labs showing stable Cr when compared with that of previous labs.   Hypokalemia --Still not yet at goal with K 3.6. Replete with goal 4.0. Received a KCl tab 30mg  once today and should reassess electrolytes in AM to ensure at goal.     For questions or updates, please contact [place] Please consult [email or web address] for contact info under        Signed, Arvil Chaco, PA-C  01/21/2019, 8:53 AM

## 2019-01-22 DIAGNOSIS — K921 Melena: Secondary | ICD-10-CM

## 2019-01-22 LAB — BASIC METABOLIC PANEL
Anion gap: 6 (ref 5–15)
BUN: 24 mg/dL — ABNORMAL HIGH (ref 6–20)
CO2: 27 mmol/L (ref 22–32)
Calcium: 8.4 mg/dL — ABNORMAL LOW (ref 8.9–10.3)
Chloride: 104 mmol/L (ref 98–111)
Creatinine, Ser: 1.69 mg/dL — ABNORMAL HIGH (ref 0.44–1.00)
GFR calc Af Amer: 40 mL/min — ABNORMAL LOW (ref >60)
GFR calc non Af Amer: 35 mL/min — ABNORMAL LOW (ref >60)
Glucose, Bld: 82 mg/dL (ref 70–99)
Potassium: 4.4 mmol/L (ref 3.5–5.1)
Sodium: 137 mmol/L (ref 135–145)

## 2019-01-22 LAB — CBC
HCT: 27.3 % — ABNORMAL LOW (ref 36.0–46.0)
Hemoglobin: 8.5 g/dL — ABNORMAL LOW (ref 12.0–15.0)
MCH: 20.6 pg — ABNORMAL LOW (ref 26.0–34.0)
MCHC: 31.1 g/dL (ref 30.0–36.0)
MCV: 66.1 fL — ABNORMAL LOW (ref 80.0–100.0)
Platelets: 158 10*3/uL (ref 150–400)
RBC: 4.13 MIL/uL (ref 3.87–5.11)
RDW: 16.6 % — ABNORMAL HIGH (ref 11.5–15.5)
WBC: 7.3 10*3/uL (ref 4.0–10.5)
nRBC: 0 % (ref 0.0–0.2)

## 2019-01-22 LAB — OCCULT BLOOD X 1 CARD TO LAB, STOOL: Fecal Occult Bld: POSITIVE — AB

## 2019-01-22 LAB — GLUCOSE, CAPILLARY
Glucose-Capillary: 84 mg/dL (ref 70–99)
Glucose-Capillary: 70 mg/dL (ref 70–99)
Glucose-Capillary: 84 mg/dL (ref 70–99)
Glucose-Capillary: 70 mg/dL (ref 70–99)

## 2019-01-22 MED ORDER — FERROUS SULFATE 325 (65 FE) MG PO TABS: 325.0000 mg | ORAL_TABLET | Freq: Every day | ORAL | Status: DC

## 2019-01-22 MED ORDER — SODIUM CHLORIDE 0.9% FLUSH
3.0000 mL | Freq: Two times a day (BID) | INTRAVENOUS | Status: DC
  Administered 2019-01-22: 3 mL via INTRAVENOUS

## 2019-01-22 MED ORDER — LOSARTAN POTASSIUM 25 MG PO TABS
25.0000 mg | ORAL_TABLET | Freq: Once | ORAL | Status: AC
  Administered 2019-01-22: 25 mg via ORAL
  Filled 2019-01-22: qty 1

## 2019-01-22 MED ORDER — LOSARTAN POTASSIUM 50 MG PO TABS
50.0000 mg | ORAL_TABLET | Freq: Every day | ORAL | Status: DC
  Administered 2019-01-23: 50 mg via ORAL
  Filled 2019-01-22: qty 1

## 2019-01-22 NOTE — Plan of Care (Signed)
  Problem: Education: Goal: Knowledge of General Education information will improve Description: Including pain rating scale, medication(s)/side effects and non-pharmacologic comfort measures Outcome: Progressing   Problem: Activity: Goal: Risk for activity intolerance will decrease Outcome: Progressing   Problem: Pain Managment: Goal: General experience of comfort will improve Outcome: Progressing   

## 2019-01-22 NOTE — Progress Notes (Addendum)
PROGRESS NOTE    [name]  OBS:[id number] DOB: Aug 28, 1970 DOA: 01/19/2019 PCP: [name], MD      Assessment & Plan:   Active Problems:   CAD in native artery   Chest pain   Hypertensive urgency   Chest pain: likely secondary to demand ischemia. No chest pain today again. Troponins are elevated. Continue on aspirin, plavix, imdur. Morphine & nitro prn for pain. Continue on tele. Cardio following and recs apprec  Hypokalemia: WNL today. Will continue to monitor   AKI: baseline Cr is unknown. Cr is trending up today. Will continue to monitor   DM2: continue on SSI w/ accuchecks  HLD: continue on statin  Hypothyroidism: continue synthroid   Hypertension: uncontrolled. Continue on carvedilol, amlodipine, imdur & losartan. Hydralazine prn  Peripheral neuropathy: continue on gabapentin  Depression: severity unknown. Continue on home dose of bupropion, venlafaxine  ACD: etiology unclear. Will start iron supplements. No need for a transfusion at this time. Will continue to monitor   Melena: fecal occult was positive. H&H in Oct 2020 was in 8.8/28.7 and it is currently 8.5/27.3. Not acutely bleeding but would benefit from a colonoscopy as an outpatient. Needs f/u w/ GI in 1-2 weeks and pt verbalized her understanding   DVT prophylaxis: heparin Code Status: full  Family Communication: discussed w/ pt's son via phone and requested that the pt stay another night as he is worried about her "blood levels". I explained the above situation to pt's son. Pt's son verbalized his understanding Disposition Plan: stable for d/c and will be d/c in AM as pt requested to stay one more night    Consultants:   cardio   Procedures: n/a   Antimicrobials: n/a   Subjective: Pt c/o fatigue  Objective: Vitals:   01/21/19 1623 01/21/19 1935 01/22/19 0553 01/22/19 0759  BP: 137/75 134/79 133/62 (!) 174/84  Pulse: 70 60 (!) 59 (!) 59  Resp: (!) 22 19 19 19     Temp:  97.8 F (36.6 C) 98.3 F (36.8 C) 98.4 F (36.9 C)  TempSrc:  Oral Oral Oral  SpO2: 100% 100% 100% 99%  Weight:   68.2 kg   Height:        Intake/Output Summary (Last 24 hours) at 01/22/2019 1550 Last data filed at 01/22/2019 1300 Gross per 24 hour  Intake 480 ml  Output --  Net 480 ml   Filed Weights   01/20/19 0613 01/21/19 0400 01/22/19 0553  Weight: 69.2 kg 69.2 kg 68.2 kg    Examination:  General exam: Appears calm and comfortable  Respiratory system: Clear to auscultation. Respiratory effort normal. No wheezes, rhonchi Cardiovascular system: S1 & S2 +. No rubs, gallops or clicks.  Gastrointestinal system: Abdomen is nondistended, soft and nontender. Normal bowel sounds heard. Central nervous system: Alert and oriented. Moves all 4 extremities Psychiatry: Judgement and insight appear normal. Mood & affect appropriate.     Data Reviewed: I have personally reviewed following labs and imaging studies  CBC: Recent Labs  Lab 01/19/19 1537 01/20/19 0402 01/21/19 0454 01/22/19 0618  WBC 10.4 9.2 8.0 7.3  HGB 9.5* 8.7* 8.2* 8.5*  HCT 31.9* 27.4* 27.2* 27.3*  MCV 69.8* 66.2* 69.4* 66.1*  PLT 234 185 159 476   Basic Metabolic Panel: Recent Labs  Lab 01/19/19 1537 01/20/19 0826 01/21/19 0454 01/22/19 0618  NA 140  --  141 137  K 3.1*  --  3.6 4.4  CL 105  --  108 104  CO2  24  --  25 27  GLUCOSE 94  --  70 82  BUN 17  --  23* 24*  CREATININE 1.74* 1.58* 1.55* 1.69*  CALCIUM 8.1*  --  8.3* 8.4*  MG  --  1.3*  --   --    GFR: Estimated Creatinine Clearance: 36 mL/min (A) (by C-G formula based on SCr of 1.69 mg/dL (H)). Liver Function Tests: No results for input(s): AST, ALT, ALKPHOS, BILITOT, PROT, ALBUMIN in the last 168 hours. No results for input(s): LIPASE, AMYLASE in the last 168 hours. No results for input(s): AMMONIA in the last 168 hours. Coagulation Profile: Recent Labs  Lab 01/20/19 0402  INR 1.1   Cardiac Enzymes: No results for  input(s): CKTOTAL, CKMB, CKMBINDEX, TROPONINI in the last 168 hours. BNP (last 3 results) No results for input(s): PROBNP in the last 8760 hours. HbA1C: Recent Labs    01/20/19 0402  HGBA1C 5.9*   CBG: Recent Labs  Lab 01/21/19 1158 01/21/19 1623 01/21/19 2112 01/22/19 0801 01/22/19 1225  GLUCAP 66* 105* 158* 84 70   Lipid Profile: No results for input(s): CHOL, HDL, LDLCALC, TRIG, CHOLHDL, LDLDIRECT in the last 72 hours. Thyroid Function Tests: Recent Labs    01/20/19 0402  TSH 3.500   Anemia Panel: Recent Labs    01/21/19 0454  FERRITIN 6*  TIBC 243*  IRON 23*   Sepsis Labs: No results for input(s): PROCALCITON, LATICACIDVEN in the last 168 hours.  Recent Results (from the past 240 hour(s))  Respiratory Panel by RT PCR (Flu A&B, Covid) - Nasopharyngeal Swab     Status: None   Collection Time: 01/19/19  8:36 PM   Specimen: Nasopharyngeal Swab  Result Value Ref Range Status   SARS Coronavirus 2 by RT PCR NEGATIVE NEGATIVE Final    Comment: (NOTE) SARS-CoV-2 target nucleic acids are NOT DETECTED. The SARS-CoV-2 RNA is generally detectable in upper respiratoy specimens during the acute phase of infection. The lowest concentration of SARS-CoV-2 viral copies this assay can detect is 131 copies/mL. A negative result does not preclude SARS-Cov-2 infection and should not be used as the sole basis for treatment or other patient management decisions. A negative result may occur with  improper specimen collection/handling, submission of specimen other than nasopharyngeal swab, presence of viral mutation(s) within the areas targeted by this assay, and inadequate number of viral copies (<131 copies/mL). A negative result must be combined with clinical observations, patient history, and epidemiological information. The expected result is Negative. Fact Sheet for Patients:  PinkCheek.be Fact Sheet for Healthcare Providers:    GravelBags.it This test is not yet ap proved or cleared by the [place] FDA and  has been authorized for detection and/or diagnosis of SARS-CoV-2 by FDA under an Emergency Use Authorization (EUA). This EUA will remain  in effect (meaning this test can be used) for the duration of the COVID-19 declaration under Section 564(b)(1) of the Act, 21 U.S.C. section 360bbb-3(b)(1), unless the authorization is terminated or revoked sooner.    Influenza A by PCR NEGATIVE NEGATIVE Final   Influenza B by PCR NEGATIVE NEGATIVE Final    Comment: (NOTE) The Xpert Xpress SARS-CoV-2/FLU/RSV assay is intended as an aid in  the diagnosis of influenza from Nasopharyngeal swab specimens and  should not be used as a sole basis for treatment. Nasal washings and  aspirates are unacceptable for Xpert Xpress SARS-CoV-2/FLU/RSV  testing. Fact Sheet for Patients: PinkCheek.be Fact Sheet for Healthcare Providers: GravelBags.it This test is not yet approved  or cleared by the [place] and  has been authorized for detection and/or diagnosis of SARS-CoV-2 by  FDA under an Emergency Use Authorization (EUA). This EUA will remain  in effect (meaning this test can be used) for the duration of the  Covid-19 declaration under Section 564(b)(1) of the Act, 21  U.S.C. section 360bbb-3(b)(1), unless the authorization is  terminated or revoked. Performed at [facility], [address], [place], [place] [address]          Radiology Studies: No results found.      Scheduled Meds: . amLODipine  10 mg Oral Daily  . aspirin EC  81 mg Oral Daily  . buPROPion  150 mg Oral Daily  . carvedilol  12.5 mg Oral BID WC  . clopidogrel  75 mg Oral Daily  . furosemide  20 mg Oral Daily  . gabapentin  300 mg Oral BID  . heparin  5,000 Units Subcutaneous Q8H  . insulin aspart  0-9 Units Subcutaneous TID WC  .  isosorbide mononitrate  60 mg Oral Daily  . levothyroxine  25 mcg Oral QAC breakfast  . [START ON 01/23/2019] losartan  50 mg Oral Daily  . pantoprazole  40 mg Oral BID  . potassium chloride  10 mEq Oral Daily  . rosuvastatin  40 mg Oral Daily  . venlafaxine XR  75 mg Oral Q breakfast   Continuous Infusions:    LOS: 2 days    Time spent: 30 mins    Wyvonnia Dusky, MD [facility] Pager [phone]  If 7PM-7AM, please contact night-coverage [email or web address] Password [email or web address] 01/22/2019, 3:50 PM

## 2019-01-22 NOTE — Progress Notes (Signed)
Progress Note  Patient Name: [name] Date of Encounter: 01/22/2019  Primary Cardiologist: Ida Rogue, MD   Subjective   Feeling well.  Breathing improved.  No recurrent chest pain since admission.   Inpatient Medications    Scheduled Meds: . amLODipine  10 mg Oral Daily  . aspirin EC  81 mg Oral Daily  . buPROPion  150 mg Oral Daily  . carvedilol  12.5 mg Oral BID WC  . clopidogrel  75 mg Oral Daily  . furosemide  20 mg Oral Daily  . gabapentin  300 mg Oral BID  . heparin  5,000 Units Subcutaneous Q8H  . insulin aspart  0-9 Units Subcutaneous TID WC  . isosorbide mononitrate  60 mg Oral Daily  . levothyroxine  25 mcg Oral QAC breakfast  . losartan  25 mg Oral Daily  . pantoprazole  40 mg Oral BID  . potassium chloride  10 mEq Oral Daily  . rosuvastatin  40 mg Oral Daily  . venlafaxine XR  75 mg Oral Q breakfast   Continuous Infusions:  PRN Meds: acetaminophen, ALPRAZolam, hydrALAZINE, morphine injection, nitroGLYCERIN, ondansetron (ZOFRAN) IV, promethazine, zolpidem   Vital Signs    Vitals:   01/21/19 1623 01/21/19 1935 01/22/19 0553 01/22/19 0759  BP: 137/75 134/79 133/62 (!) 174/84  Pulse: 70 60 (!) 59 (!) 59  Resp: (!) 22 19 19 19   Temp:  97.8 F (36.6 C) 98.3 F (36.8 C) 98.4 F (36.9 C)  TempSrc:  Oral Oral Oral  SpO2: 100% 100% 100% 99%  Weight:   68.2 kg   Height:        Intake/Output Summary (Last 24 hours) at 01/22/2019 1205 Last data filed at 01/22/2019 0900 Gross per 24 hour  Intake 480 ml  Output -  Net 480 ml   Last 3 Weights 01/22/2019 01/21/2019 01/20/2019  Weight (lbs) 150 lb 6.4 oz 152 lb 8 oz 152 lb 8 oz  Weight (kg) 68.221 kg 69.174 kg 69.174 kg  Some encounter information is confidential and restricted. Go to Review Flowsheets activity to see all data.      Telemetry    Sinus bradycardia, sinus rhythm.  Rate 50s-80s - Personally Reviewed  ECG    N/a - Personally Reviewed  Physical Exam   VS:  BP (!)  174/84 (BP Location: Right Arm)   Pulse (!) 59   Temp 98.4 F (36.9 C) (Oral)   Resp 19   Ht 5\' 2"  (1.575 m)   Wt 68.2 kg   SpO2 99%   BMI 27.51 kg/m  , BMI Body mass index is 27.51 kg/m. GENERAL:  Well appearing HEENT: Pupils equal round and reactive, fundi not visualized, oral mucosa unremarkable NECK:  No jugular venous distention, waveform within normal limits, carotid upstroke brisk and symmetric, no bruits LUNGS:  Clear to auscultation bilaterally HEART:  RRR.  PMI not displaced or sustained,S1 and S2 within normal limits, no S3, no S4, no clicks, no rubs, no murmurs ABD:  Flat, positive bowel sounds normal in frequency in pitch, no bruits, no rebound, no guarding, no midline pulsatile mass, no hepatomegaly, no splenomegaly EXT:  2 plus pulses throughout, no edema, no cyanosis no clubbing SKIN:  No rashes no nodules NEURO:  Cranial nerves II through XII grossly intact, motor grossly intact throughout PSYCH:  Cognitively intact, oriented to person place and time   Labs    High Sensitivity Troponin:   Recent Labs  Lab 01/19/19 1537 01/19/19 1732 01/20/19  0826 01/21/19 0454  TROPONINIHS 21* 21* 16 11      Chemistry Recent Labs  Lab 01/19/19 1537 01/20/19 0826 01/21/19 0454 01/22/19 0618  NA 140  --  141 137  K 3.1*  --  3.6 4.4  CL 105  --  108 104  CO2 24  --  25 27  GLUCOSE 94  --  70 82  BUN 17  --  23* 24*  CREATININE 1.74* 1.58* 1.55* 1.69*  CALCIUM 8.1*  --  8.3* 8.4*  GFRNONAA 34* 38* 39* 35*  GFRAA 39* 44* 45* 40*  ANIONGAP 11  --  8 6     Hematology Recent Labs  Lab 01/20/19 0402 01/21/19 0454 01/22/19 0618  WBC 9.2 8.0 7.3  RBC 4.14 3.92 4.13  HGB 8.7* 8.2* 8.5*  HCT 27.4* 27.2* 27.3*  MCV 66.2* 69.4* 66.1*  MCH 21.0* 20.9* 20.6*  MCHC 31.8 30.1 31.1  RDW 16.8* 16.6* 16.6*  PLT 185 159 158    BNPNo results for input(s): BNP, PROBNP in the last 168 hours.   DDimer No results for input(s): DDIMER in the last 168 hours.   Radiology     No results found.  Cardiac Studies   Echo 02/18/18: Study Conclusions  - Left ventricle: The cavity size was normal. Systolic function was   normal. The estimated ejection fraction was in the range of 60%   to 65%. Wall motion was normal; there were no regional wall   motion abnormalities. Left ventricular diastolic function   parameters were normal. - Left atrium: The atrium was normal in size. - Right ventricle: Systolic function was normal. - Pulmonary arteries: Systolic pressure was within the normal   range.  Impressions:  - No cardiac source of emboli was indentified.   Patient Profile     Ms. St. [name] is a 3F with chronic systolic diastolic heart failure (LVEF 45 to 50%), CAD status post PCI, CKD, stroke, PAD, and gastric bypass admitted with hypertensive urgency, chest pain, and shortness of breath.  Assessment & Plan    # Hypertensive urgency: Overall BP is better controlled but was elevated to 174/84 this AM.  Increase home losartan to 50mg  daily.  Renal function is at baseline.  Amlodipine and carvedilol were added this admission.  Metoprolol was switched to carvedilol for improved BP control and due to bradycardia.   # Chest pain: # CAD s/p PCI: # Hyperlipidemia: High sensitivity troponin was minimally elevated and flat.  Not consistent with ACS.  No additional CP since BP has been better controlled.   # Acute diastolic heart failure: # Shortness of breath: Patient was mildly volume overloaded and ReDS vest was 52.  Added lasix 20mg  daily and weight decreased by 1kg.  I/O not well-recoreded.  Renal function stable.  Continue lasix 20mg  daily.  BP control as above.   # Iron deficiency anemia:  Per IM.  OK for discharge from a cardiac standpoint.  We will arrange outpatient follow up.      For questions or updates, please contact [place] Please consult [email or web address] for contact info under        Signed, Skeet Latch, MD   01/22/2019, 12:05 PM

## 2019-01-23 ENCOUNTER — Telehealth: Payer: Self-pay | Admitting: Cardiovascular Disease

## 2019-01-23 LAB — BASIC METABOLIC PANEL
Anion gap: 9 (ref 5–15)
BUN: 24 mg/dL — ABNORMAL HIGH (ref 6–20)
CO2: 25 mmol/L (ref 22–32)
Calcium: 8.6 mg/dL — ABNORMAL LOW (ref 8.9–10.3)
Chloride: 101 mmol/L (ref 98–111)
Creatinine, Ser: 1.67 mg/dL — ABNORMAL HIGH (ref 0.44–1.00)
GFR calc Af Amer: 41 mL/min — ABNORMAL LOW (ref >60)
GFR calc non Af Amer: 35 mL/min — ABNORMAL LOW (ref >60)
Glucose, Bld: 80 mg/dL (ref 70–99)
Potassium: 4.1 mmol/L (ref 3.5–5.1)
Sodium: 135 mmol/L (ref 135–145)

## 2019-01-23 LAB — CBC
HCT: 29.3 % — ABNORMAL LOW (ref 36.0–46.0)
Hemoglobin: 8.8 g/dL — ABNORMAL LOW (ref 12.0–15.0)
MCH: 20.3 pg — ABNORMAL LOW (ref 26.0–34.0)
MCHC: 30 g/dL (ref 30.0–36.0)
MCV: 67.7 fL — ABNORMAL LOW (ref 80.0–100.0)
Platelets: 193 10*3/uL (ref 150–400)
RBC: 4.33 MIL/uL (ref 3.87–5.11)
RDW: 16.4 % — ABNORMAL HIGH (ref 11.5–15.5)
WBC: 8.5 10*3/uL (ref 4.0–10.5)
nRBC: 0 % (ref 0.0–0.2)

## 2019-01-23 LAB — GLUCOSE, CAPILLARY: Glucose-Capillary: 83 mg/dL (ref 70–99)

## 2019-01-23 MED ORDER — DEXMEDETOMIDINE HCL IN NACL 200 MCG/50ML IV SOLN: 0.4000 ug/kg/h | INTRAVENOUS | Status: DC

## 2019-01-23 MED ORDER — AMLODIPINE BESYLATE 10 MG PO TABS: 10.0000 mg | ORAL_TABLET | Freq: Every day | ORAL | 0 refills | Status: DC

## 2019-01-23 MED ORDER — ASPIRIN 81 MG PO TBEC: 81.0000 mg | DELAYED_RELEASE_TABLET | Freq: Every day | ORAL | 0 refills | Status: AC

## 2019-01-23 MED ORDER — POTASSIUM CHLORIDE CRYS ER 10 MEQ PO TBCR: 10.0000 meq | EXTENDED_RELEASE_TABLET | Freq: Every day | ORAL | 0 refills | Status: DC

## 2019-01-23 MED ORDER — FUROSEMIDE 20 MG PO TABS: 20.0000 mg | ORAL_TABLET | Freq: Every day | ORAL | 0 refills | Status: DC

## 2019-01-23 MED ORDER — ISOSORBIDE MONONITRATE ER 60 MG PO TB24: 60.0000 mg | ORAL_TABLET | Freq: Every day | ORAL | 0 refills | Status: DC

## 2019-01-23 MED ORDER — CARVEDILOL 12.5 MG PO TABS: 12.5000 mg | ORAL_TABLET | Freq: Two times a day (BID) | ORAL | 0 refills | Status: DC

## 2019-01-23 MED ORDER — FERROUS SULFATE 325 (65 FE) MG PO TABS: 325.0000 mg | ORAL_TABLET | Freq: Every day | ORAL | 0 refills | Status: DC

## 2019-01-23 MED ORDER — LOSARTAN POTASSIUM 50 MG PO TABS: 50.0000 mg | ORAL_TABLET | Freq: Every day | ORAL | 0 refills | Status: DC

## 2019-01-23 NOTE — Telephone Encounter (Signed)
TCM....  Patient is being discharged      They are scheduled to see Urban Gibson 12/28   They were seen for [place]  They need to be seen within 7-10 days      Please call

## 2019-01-23 NOTE — Telephone Encounter (Signed)
Patient states that she was discharged today. She states that when she got home her son wanted her to make sausage balls and she was up doing that when she could not breath. Reviewed that she should rest and increase activity slowly because when in the hospital it can make you weak and have those type of feelings with any kind of activity. Instructed her to rest, increase activity slowly, and to not try doing anything for a few days until she can build her strength up. She verbalized understanding with no further questions at this time.

## 2019-01-23 NOTE — Care Management Important Message (Signed)
Important Message  Patient Details  Name: [name] MRN: [id number] Date of Birth: 1970-03-17   Medicare Important Message Given:  Yes     Dannette Barbara 01/23/2019, 11:49 AM

## 2019-01-23 NOTE — Discharge Summary (Signed)
Physician Discharge Summary  [name] [place] JYN:[id number] DOB: 1970/06/08 DOA: 01/19/2019  PCP: [name], MD  Admit date: 01/19/2019 Discharge date: 01/23/2019  Admitted From: home Disposition:  home  Recommendations for Outpatient Follow-up:  1. Follow up with PCP in 1-2 weeks 2. F/U GI in 1-2 weeks, but first available appt is being of Feb 3. F/u cardio in 1 week   Home Health: no Equipment/Devices: n/a  Discharge Condition: stable CODE STATUS: full  Diet recommendation: Heart Healthy    Brief/Interim Summary: HPI taken from Dr. Arville [name]:  [address] [name]  is a 48 y.o. [name] with a known history of coronary artery disease status post PCI and DES, type 2 diabetes mellitus, hypertension, hypothyroidism, dyslipidemia and bipolar disorder, presented to the emergency room with acute onset of midsternal chest tightness graded 10/10 in severity with mild associated dyspnea and palpitations without nausea or vomiting or diaphoresis.  The patient denied any cough or wheezing or hemoptysis.  No bleeding diathesis.  She is taking her aspirin and Plavix.  She denies any leg pain or edema recent travels or sick exposure.  She had a hip surgery in September of this year at [facility] that was apparently ORIF.  Upon presentation to the emergency room, blood pressure was 163/92 and otherwise vital signs were within normal.  Labs revealed hypokalemia of 3.1 and creatinine 1.74 slightly better than previous level on 11/04/2018 (1.89 then).  Troponin I was 21 and later 21 and CBC showed anemia with hemoglobin 9.5 hematocrit 31.9 better than previous levels.  Fibrin derivatives D-dimer was 729.81.  Portable chest ray showed no acute cardiopulmonary disease and chest CTA revealed no evidence for PE.  It showed right sided aortic arch with aberrant left subclavian artery which has been embolized and bypass graft from the left common carotid artery to the left subclavian artery with  grossly patent left subclavian artery around the level of anastomosis.  Overall this is stable since 2017.  It showed aortic atherosclerosis.  EKG showed normal sinus rhythm with rate of 80 with sagging ST segment laterally and inverted T wave inferiorly.  The patient was given 40 aspirin, 4 mg IV morphine sulfate and 4 mg IV Zofran as well as 1 L bolus of IV normal saline and was started on IV heparin bolus and drip.  She will be admitted to an observation telemetry bed for further evaluation and monitoring.  Hospital Course from [name]: Cardio was consulted for chest pain. Cardio did recommend any further inpatient cardio work-up for ischemia. The chest pain was likely secondary to demand ischemia. Pt was also found to have HFrEF ( previous EF 34-50% & now 60-65%). Pt was started on po lasix & potassium as per cardio. Furthermore, pt was found to uncontrolled HTN and pt's amlodipine was increased to 10mg , imdur was increased to 60mg , metoprolol was d/c and pt was started on coreg, losartan was increased to 50mg  & pt was started on an aspirin as per cardio. Pt will f/u w/ cardio in 1-2 weeks. Finally, pt was found to be fecal occult positive. H&H remained stable throughout stay and will f/u w/ GI outpatient in 1-2 weeks for further evaluation & treatment, however the first available appt for GI was at the beginning of Feb. Pt was started on po iron for low iron levels.   Discharge Diagnoses:  Active Problems:   CAD in native artery   Chest pain   Hypertensive urgency   Melena  Chest  pain: likely secondary to demand ischemia. No chest pain today again. Troponins are elevated. Continue on aspirin, plavix, imdur. Morphine & nitro prn for pain. Continue on tele. Cardio following and recs apprec  Acute diastolic CHF exacerbation: continue on lasix & potassium. Monitor I/Os. Cardio following and recs apprec  Hypokalemia: WNL today. Will continue to monitor   AKI: baseline Cr is unknown. Cr is  trending up today. Will continue to monitor   DM2: continue on SSI w/ accuchecks  HLD: continue on statin  Hypothyroidism: continue synthroid   Hypertension: uncontrolled. Continue on carvedilol, amlodipine, imdur & losartan. Hydralazine prn  Peripheral neuropathy: continue on gabapentin  Depression: severity unknown. Continue on home dose of bupropion, venlafaxine  ACD: etiology unclear. Will start iron supplements. No need for a transfusion at this time. Will continue to monitor   Melena: fecal occult was positive. H&H in Oct 2020 was in 8.8/28.7 and it is currently 8.8/29.3. Not acutely bleeding but would benefit from a colonoscopy as an outpatient. Needs f/u w/ GI in 1-2 weeks and pt verbalized her understanding   Discharge Instructions  Discharge Instructions    Diet - low sodium heart healthy   Complete by: As directed    Diet Carb Modified   Complete by: As directed    Discharge instructions   Complete by: As directed    F/u PCP in 1-2 weeks; F/U cardio in 1 week; F/u GI in 1 week   Increase activity slowly   Complete by: As directed      Allergies as of 01/23/2019      Reactions   Lipitor [atorvastatin] Other (See Comments)   Leg pains   Tramadol Other (See Comments)   Mouth feels like it's on fire   Lactose Intolerance (gi) Nausea Only      Medication List    STOP taking these medications   metoprolol succinate 25 MG 24 hr tablet Commonly known as: Toprol XL     TAKE these medications   ALPRAZolam 1 MG tablet Commonly known as: XANAX TAKE 1 TABLET BY MOUTH ONCE OR TWICE A DAY FOR ANXIETY   amLODipine 10 MG tablet Commonly known as: NORVASC Take 1 tablet (10 mg total) by mouth daily. What changed:   medication strength  how much to take   aspirin 81 MG EC tablet Take 1 tablet (81 mg total) by mouth daily for 30 doses.   buPROPion 150 MG 24 hr tablet Commonly known as: WELLBUTRIN XL TAKE 1 TABLET BY MOUTH EVERY DAY   carvedilol  12.5 MG tablet Commonly known as: COREG Take 1 tablet (12.5 mg total) by mouth 2 (two) times daily with a meal.   clopidogrel 75 MG tablet Commonly known as: PLAVIX Take 1 tablet (75 mg total) by mouth daily.   diphenoxylate-atropine 2.5-0.025 MG tablet Commonly known as: LOMOTIL Take one capsule 1-4 times daily as needed for diarrhea What changed:   how much to take  how to take this  when to take this  additional instructions   Emgality 120 MG/ML Soaj Generic drug: Galcanezumab-gnlm INJECT 240 MG SUBCUTANEOUSLY AS DIRECTED FOR THE FIRST MONTH.   ferrous sulfate 325 (65 FE) MG tablet Take 1 tablet (325 mg total) by mouth daily with breakfast.   furosemide 20 MG tablet Commonly known as: LASIX Take 1 tablet (20 mg total) by mouth daily.   gabapentin 300 MG capsule Commonly known as: NEURONTIN Take 1 capsule (300 mg total) by mouth 2 (two) times daily.  isosorbide mononitrate 60 MG 24 hr tablet Commonly known as: IMDUR Take 1 tablet (60 mg total) by mouth daily. What changed:   medication strength  how much to take   levothyroxine 25 MCG tablet Commonly known as: SYNTHROID Take 1 tablet (25 mcg total) by mouth daily before breakfast.   losartan 50 MG tablet Commonly known as: COZAAR Take 1 tablet (50 mg total) by mouth daily. What changed:   medication strength  how much to take   metFORMIN 500 MG 24 hr tablet Commonly known as: GLUCOPHAGE-XR TAKE 1 TABLET BY MOUTH ONCE DAILY WITH DINNER   nitroGLYCERIN 0.4 MG SL tablet Commonly known as: NITROSTAT Place 1 tablet (0.4 mg total) under the tongue every 5 (five) minutes as needed for chest pain.   pantoprazole 40 MG tablet Commonly known as: PROTONIX Take 1 tablet (40 mg total) by mouth 2 (two) times daily.   potassium chloride 10 MEQ tablet Commonly known as: KLOR-CON Take 1 tablet (10 mEq total) by mouth daily.   promethazine 25 MG tablet Commonly known as: PHENERGAN Take 1 tablet (25 mg  total) by mouth every 6 (six) hours as needed for nausea or vomiting.   rosuvastatin 40 MG tablet Commonly known as: CRESTOR Take 1 tablet (40 mg total) by mouth daily.   venlafaxine XR 75 MG 24 hr capsule Commonly known as: EFFEXOR-XR Take 1 capsule (75 mg total) by mouth daily with breakfast.      Follow-up Information    Midge Minium, MD. Go on 01/30/2019.   Specialty: Gastroenterology Why: Positive fecal occult, no acute blood loss anemia  Contact information: [address] [place]  [place] [address] [phone]          Allergies  Allergen Reactions  . Lipitor [Atorvastatin] Other (See Comments)    Leg pains  . Tramadol Other (See Comments)    Mouth feels like it's on fire  . Lactose Intolerance (Gi) Nausea Only    Consultations:  cardio   Procedures/Studies: DG Chest 2 View  Result Date: 01/19/2019 CLINICAL DATA:  Pt c/o CP since Tuesday that is pressure like in nature and some SOB. Pt states called her MD and was advised to come to the ED. Hx of CHF, stage 3 kidney failure. EXAM: CHEST - 2 VIEW COMPARISON:  10/12/2018 FINDINGS: Cardiac silhouette is normal in size. No mediastinal or hilar masses. No evidence of adenopathy. Clear lungs.  No pleural effusion or pneumothorax. Skeletal structures are intact. IMPRESSION: No active cardiopulmonary disease. Electronically Signed   By: Amie Portland M.D.   On: 01/19/2019 15:50   CT Angio Chest PE W and/or Wo Contrast  Result Date: 01/19/2019 CLINICAL DATA:  Shortness of breath EXAM: CT ANGIOGRAPHY CHEST WITH CONTRAST TECHNIQUE: Multidetector CT imaging of the chest was performed using the standard protocol during bolus administration of intravenous contrast. Multiplanar CT image reconstructions and MIPs were obtained to evaluate the vascular anatomy. CONTRAST:  60mL OMNIPAQUE IOHEXOL 350 MG/ML SOLN COMPARISON:  05/31/2015 FINDINGS: Cardiovascular: Contrast injection is sufficient to demonstrate satisfactory opacification  of the pulmonary arteries to the segmental level. There is no pulmonary embolus. The main pulmonary artery is within normal limits for size. There is no CT evidence of acute right heart strain. There are mild atherosclerotic changes of the visualized thoracic aorta. 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 There is a bypass graft from the left common carotid artery to the left subclavian artery. The visualized portions  of the left subclavian artery are grossly patent beyond the level of the anastomosis. The appearance of this finding is similar to prior CT. Heart size is mildly enlarged. Coronary artery calcifications are noted. There is no significant pericardial effusion. Mediastinum/Nodes: --No mediastinal or hilar lymphadenopathy. --No axillary lymphadenopathy. --No supraclavicular lymphadenopathy. --the left hemi thyroid appears to be either surgically absent or atretic. --The esophagus is unremarkable Lungs/Pleura: No pulmonary nodules or masses. No pleural effusion or pneumothorax. No focal airspace consolidation. No focal pleural abnormality. Upper Abdomen: The patient appears to be status post prior gastric bypass. The patient is status post prior cholecystectomy. The upper abdomen is otherwise unremarkable. Musculoskeletal: No chest wall abnormality. No acute or significant osseous findings. Review of the MIP images confirms the above findings. IMPRESSION: 1. No evidence for pulmonary embolus or other acute intrathoracic process. 2. 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 There is a bypass graft from the left common carotid artery to the left subclavian artery. The visualized portions of the left subclavian artery are grossly patent beyond the level of the anastomosis. Overall, this is stable since 2017. Aortic Atherosclerosis (ICD10-I70.0). Electronically Signed   By: Katherine Mantle M.D.   On:  01/19/2019 19:13   XR FEMUR MIN 2 VIEWS LEFT  Result Date: 01/10/2019 Healed intertrochanteric fracture     Subjective: Pt c/o fatigue  Discharge Exam: Vitals:   01/23/19 0600 01/23/19 0734  BP: (!) 143/80 (!) 156/75  Pulse: 60 60  Resp: 20 19  Temp: 98.6 F (37 C) 98.4 F (36.9 C)  SpO2: 99% 100%   Vitals:   01/22/19 0759 01/22/19 2034 01/23/19 0600 01/23/19 0734  BP: (!) 174/84 130/81 (!) 143/80 (!) 156/75  Pulse: (!) 59 71 60 60  Resp: 19 20 20 19   Temp: 98.4 F (36.9 C) 98.5 F (36.9 C) 98.6 F (37 C) 98.4 F (36.9 C)  TempSrc: Oral Oral Oral Oral  SpO2: 99% 98% 99% 100%  Weight:      Height:        General: Pt is alert, awake, not in acute distress Cardiovascular:  S1/S2 +, no rubs, no gallops Respiratory: CTA bilaterally, no wheezing, no rhonchi Abdominal: Soft, NT, ND, bowel sounds + Extremities: no edema, no cyanosis    The results of significant diagnostics from this hospitalization (including imaging, microbiology, ancillary and laboratory) are listed below for reference.     Microbiology: Recent Results (from the past 240 hour(s))  Respiratory Panel by RT PCR (Flu A&B, Covid) - Nasopharyngeal Swab     Status: None   Collection Time: 01/19/19  8:36 PM   Specimen: Nasopharyngeal Swab  Result Value Ref Range Status   SARS Coronavirus 2 by RT PCR NEGATIVE NEGATIVE Final    Comment: (NOTE) SARS-CoV-2 target nucleic acids are NOT DETECTED. The SARS-CoV-2 RNA is generally detectable in upper respiratoy specimens during the acute phase of infection. The lowest concentration of SARS-CoV-2 viral copies this assay can detect is 131 copies/mL. A negative result does not preclude SARS-Cov-2 infection and should not be used as the sole basis for treatment or other patient management decisions. A negative result may occur with  improper specimen collection/handling, submission of specimen other than nasopharyngeal swab, presence of viral mutation(s)  within the areas targeted by this assay, and inadequate number of viral copies (<131 copies/mL). A negative result must be combined with clinical observations, patient history, and epidemiological information. The expected result is Negative. Fact Sheet for Patients:  https://www.moore.com/ Fact Sheet for Healthcare Providers:  https://www.young.biz/ This test is not yet ap proved or cleared by the [place] FDA and  has been authorized for detection and/or diagnosis of SARS-CoV-2 by FDA under an Emergency Use Authorization (EUA). This EUA will remain  in effect (meaning this test can be used) for the duration of the COVID-19 declaration under Section 564(b)(1) of the Act, 21 U.S.C. section 360bbb-3(b)(1), unless the authorization is terminated or revoked sooner.    Influenza A by PCR NEGATIVE NEGATIVE Final   Influenza B by PCR NEGATIVE NEGATIVE Final    Comment: (NOTE) The Xpert Xpress SARS-CoV-2/FLU/RSV assay is intended as an aid in  the diagnosis of influenza from Nasopharyngeal swab specimens and  should not be used as a sole basis for treatment. Nasal washings and  aspirates are unacceptable for Xpert Xpress SARS-CoV-2/FLU/RSV  testing. Fact Sheet for Patients: https://www.moore.com/ Fact Sheet for Healthcare Providers: https://www.young.biz/ This test is not yet approved or cleared by the [place] FDA and  has been authorized for detection and/or diagnosis of SARS-CoV-2 by  FDA under an Emergency Use Authorization (EUA). This EUA will remain  in effect (meaning this test can be used) for the duration of the  Covid-19 declaration under Section 564(b)(1) of the Act, 21  U.S.C. section 360bbb-3(b)(1), unless the authorization is  terminated or revoked. Performed at [facility], [address] [place], [place] [address]      Labs: BNP (last 3 results) Recent Labs     06/15/18 0154 10/12/18 0944  BNP 67.0 52.2   Basic Metabolic Panel: Recent Labs  Lab 01/19/19 1537 01/20/19 0826 01/21/19 0454 01/22/19 0618 01/23/19 0440  NA 140  --  141 137 135  K 3.1*  --  3.6 4.4 4.1  CL 105  --  108 104 101  CO2 24  --  25 27 25   GLUCOSE 94  --  70 82 80  BUN 17  --  23* 24* 24*  CREATININE 1.74* 1.58* 1.55* 1.69* 1.67*  CALCIUM 8.1*  --  8.3* 8.4* 8.6*  MG  --  1.3*  --   --   --    Liver Function Tests: No results for input(s): AST, ALT, ALKPHOS, BILITOT, PROT, ALBUMIN in the last 168 hours. No results for input(s): LIPASE, AMYLASE in the last 168 hours. No results for input(s): AMMONIA in the last 168 hours. CBC: Recent Labs  Lab 01/19/19 1537 01/20/19 0402 01/21/19 0454 01/22/19 0618 01/23/19 0440  WBC 10.4 9.2 8.0 7.3 8.5  HGB 9.5* 8.7* 8.2* 8.5* 8.8*  HCT 31.9* 27.4* 27.2* 27.3* 29.3*  MCV 69.8* 66.2* 69.4* 66.1* 67.7*  PLT 234 185 159 158 193   Cardiac Enzymes: No results for input(s): CKTOTAL, CKMB, CKMBINDEX, TROPONINI in the last 168 hours. BNP: Invalid input(s): POCBNP CBG: Recent Labs  Lab 01/22/19 0801 01/22/19 1225 01/22/19 1703 01/22/19 2306 01/23/19 0735  GLUCAP 84 70 84 70 83   D-Dimer No results for input(s): DDIMER in the last 72 hours. Hgb A1c No results for input(s): HGBA1C in the last 72 hours. Lipid Profile No results for input(s): CHOL, HDL, LDLCALC, TRIG, CHOLHDL, LDLDIRECT in the last 72 hours. Thyroid function studies No results for input(s): TSH, T4TOTAL, T3FREE, THYROIDAB in the last 72 hours.  Invalid input(s): FREET3 Anemia work up Recent Labs    01/21/19 0454  FERRITIN 6*  TIBC 243*  IRON 23*   Urinalysis    Component Value Date/Time   COLORURINE STRAW (  A) 06/25/2018 2131   APPEARANCEUR HAZY (A) 06/25/2018 2131   APPEARANCEUR Cloudy (A) 07/17/2014 1455   LABSPEC 1.003 (L) 06/25/2018 2131   LABSPEC 1.012 12/29/2013 1210   PHURINE 6.0 06/25/2018 2131   GLUCOSEU NEGATIVE 06/25/2018  2131   GLUCOSEU >=500 12/29/2013 1210   HGBUR NEGATIVE 06/25/2018 2131   HGBUR negative 04/14/2010 1435   BILIRUBINUR negative 11/04/2018 1105   BILIRUBINUR Negative 07/17/2014 1455   BILIRUBINUR Negative 12/29/2013 1210   KETONESUR NEGATIVE 06/25/2018 2131   PROTEINUR Positive (A) 11/04/2018 1105   PROTEINUR NEGATIVE 06/25/2018 2131   UROBILINOGEN 0.2 11/04/2018 1105   UROBILINOGEN negative 04/14/2010 1435   NITRITE positive 11/04/2018 1105   NITRITE NEGATIVE 06/25/2018 2131   LEUKOCYTESUR Large (3+) (A) 11/04/2018 1105   LEUKOCYTESUR NEGATIVE 06/25/2018 2131   LEUKOCYTESUR Negative 12/29/2013 1210   Sepsis Labs Invalid input(s): PROCALCITONIN,  WBC,  LACTICIDVEN Microbiology Recent Results (from the past 240 hour(s))  Respiratory Panel by RT PCR (Flu A&B, Covid) - Nasopharyngeal Swab     Status: None   Collection Time: 01/19/19  8:36 PM   Specimen: Nasopharyngeal Swab  Result Value Ref Range Status   SARS Coronavirus 2 by RT PCR NEGATIVE NEGATIVE Final    Comment: (NOTE) SARS-CoV-2 target nucleic acids are NOT DETECTED. The SARS-CoV-2 RNA is generally detectable in upper respiratoy specimens during the acute phase of infection. The lowest concentration of SARS-CoV-2 viral copies this assay can detect is 131 copies/mL. A negative result does not preclude SARS-Cov-2 infection and should not be used as the sole basis for treatment or other patient management decisions. A negative result may occur with  improper specimen collection/handling, submission of specimen other than nasopharyngeal swab, presence of viral mutation(s) within the areas targeted by this assay, and inadequate number of viral copies (<131 copies/mL). A negative result must be combined with clinical observations, patient history, and epidemiological information. The expected result is Negative. Fact Sheet for Patients:  https://www.moore.com/ Fact Sheet for Healthcare Providers:   https://www.young.biz/ This test is not yet ap proved or cleared by the [place] FDA and  has been authorized for detection and/or diagnosis of SARS-CoV-2 by FDA under an Emergency Use Authorization (EUA). This EUA will remain  in effect (meaning this test can be used) for the duration of the COVID-19 declaration under Section 564(b)(1) of the Act, 21 U.S.C. section 360bbb-3(b)(1), unless the authorization is terminated or revoked sooner.    Influenza A by PCR NEGATIVE NEGATIVE Final   Influenza B by PCR NEGATIVE NEGATIVE Final    Comment: (NOTE) The Xpert Xpress SARS-CoV-2/FLU/RSV assay is intended as an aid in  the diagnosis of influenza from Nasopharyngeal swab specimens and  should not be used as a sole basis for treatment. Nasal washings and  aspirates are unacceptable for Xpert Xpress SARS-CoV-2/FLU/RSV  testing. Fact Sheet for Patients: https://www.moore.com/ Fact Sheet for Healthcare Providers: https://www.young.biz/ This test is not yet approved or cleared by the [place] FDA and  has been authorized for detection and/or diagnosis of SARS-CoV-2 by  FDA under an Emergency Use Authorization (EUA). This EUA will remain  in effect (meaning this test can be used) for the duration of the  Covid-19 declaration under Section 564(b)(1) of the Act, 21  U.S.C. section 360bbb-3(b)(1), unless the authorization is  terminated or revoked. Performed at [facility], [address] [place], [place] [address]      Time coordinating discharge: Over 30 minutes  SIGNED:   Charise Killian, MD  [facility] 01/23/2019, 1:38 PM Pager   If 7PM-7AM, please contact night-coverage [email or web address] Password [email or web address]

## 2019-01-23 NOTE — Telephone Encounter (Signed)
Pt c/o Shortness Of Breath: STAT if SOB developed within the last 24 hours or pt is noticeably SOB on the phone   1. Are you currently SOB (can you hear that pt is SOB on the phone)? Yes, says she can't breathe   2. How long have you been experiencing SOB? Has been having this issue for a while, just left hospital recently   3. Are you SOB when sitting or when up moving around? Sitting and moving around   4. Are you currently experiencing any other symptoms? No energy

## 2019-01-24 ENCOUNTER — Telehealth: Payer: Self-pay

## 2019-01-24 ENCOUNTER — Ambulatory Visit: Payer: Self-pay

## 2019-01-24 NOTE — Chronic Care Management (AMB) (Signed)
  Chronic Care Management   Outreach Note  01/24/2019 Name: Catera Hankins MRN: [id number] DOB: Sep 02, 1970  Primary Care Provider: [name], MD Reason for referral : Chronic Care Management   Third unsuccessful telephone outreach was attempted today. Ms. [name] was referred to the care management team for assistance with chronic care management and care coordination. Her primary care provider will be notified of our unsuccessful attempts to establish and maintain contact. The care management team will gladly outreach at any time in the future if she is interested in receiving assistance.   Follow Up Plan:  The care management team will gladly follow up with Ms. [name] after the primary care provider has a conversation with her regarding recommendation for care management engagement and subsequent re-referral for care management services.    Horris Latino [facility] Care Management [phone]

## 2019-01-24 NOTE — Telephone Encounter (Signed)
Patient contacted regarding discharge from [facility] on 01/23/2019.  Patient understands to follow up with provider Laurann Montana, NP on Monday 01/30/2019 at 2:30 pm at the [place] office. Patient understands discharge instructions? Yes Patient understands medications and regiment? Yes Patient understands to bring all medications to this visit? Yes  The patient reports she was feeling very SOB last night with movement, but feels some better today.  She has not checked her BP today. She has been resting. I have advised her to call the office if needed- we will be open tomorrow and 1/2 day on Thursday, but if needed after hours call the office # for the physician on call. The patient voices understanding and is agreeable.

## 2019-01-24 NOTE — Chronic Care Management (AMB) (Signed)
  Chronic Care Management   Outreach Note   Name: [name] MRN: [id number] DOB: 08-Jun-1970  Referred by: [name], MD Reason for referral : Chronic Care Management   An unsuccessful telephone outreach was attempted today. [name] was referred to the case management team by assistance with care management and care coordination.   Unable to leave a voice message due to patient's voice mailbox being full.   Follow Up Plan: The care management team will reach out to [name] again within the next two to three weeks.    Horris Latino [facility] Care [facility] [phone]

## 2019-01-24 NOTE — Telephone Encounter (Signed)
Attempt #1... unable to leave message for the pt TCM call.. her voicemail box is full and cannot accept new messages.. will try gain at a later time.   Pt discharged 01/23/19.

## 2019-01-27 ENCOUNTER — Emergency Department: Payer: Medicare Other

## 2019-01-27 ENCOUNTER — Encounter: Payer: Self-pay | Admitting: Emergency Medicine

## 2019-01-27 ENCOUNTER — Other Ambulatory Visit: Payer: Self-pay

## 2019-01-27 ENCOUNTER — Observation Stay
Admission: EM | Admit: 2019-01-27 | Discharge: 2019-01-28 | Disposition: A | Discharge status: Home / Self Care | Payer: Medicare Other | Attending: Internal Medicine | Admitting: Internal Medicine

## 2019-01-27 DIAGNOSIS — E1140 Type 2 diabetes mellitus with diabetic neuropathy, unspecified: Secondary | ICD-10-CM | POA: Diagnosis not present

## 2019-01-27 DIAGNOSIS — I951 Orthostatic hypotension: Secondary | ICD-10-CM | POA: Insufficient documentation

## 2019-01-27 DIAGNOSIS — D631 Anemia in chronic kidney disease: Secondary | ICD-10-CM | POA: Insufficient documentation

## 2019-01-27 DIAGNOSIS — Z888 Allergy status to other drugs, medicaments and biological substances status: Secondary | ICD-10-CM | POA: Insufficient documentation

## 2019-01-27 DIAGNOSIS — D509 Iron deficiency anemia, unspecified: Secondary | ICD-10-CM | POA: Diagnosis not present

## 2019-01-27 DIAGNOSIS — Z79899 Other long term (current) drug therapy: Secondary | ICD-10-CM | POA: Diagnosis not present

## 2019-01-27 DIAGNOSIS — Z7982 Long term (current) use of aspirin: Secondary | ICD-10-CM | POA: Insufficient documentation

## 2019-01-27 DIAGNOSIS — Z7902 Long term (current) use of antithrombotics/antiplatelets: Secondary | ICD-10-CM | POA: Insufficient documentation

## 2019-01-27 DIAGNOSIS — E785 Hyperlipidemia, unspecified: Secondary | ICD-10-CM | POA: Insufficient documentation

## 2019-01-27 DIAGNOSIS — F419 Anxiety disorder, unspecified: Secondary | ICD-10-CM | POA: Insufficient documentation

## 2019-01-27 DIAGNOSIS — I69354 Hemiplegia and hemiparesis following cerebral infarction affecting left non-dominant side: Secondary | ICD-10-CM | POA: Diagnosis not present

## 2019-01-27 DIAGNOSIS — Q278 Other specified congenital malformations of peripheral vascular system: Secondary | ICD-10-CM | POA: Insufficient documentation

## 2019-01-27 DIAGNOSIS — F319 Bipolar disorder, unspecified: Secondary | ICD-10-CM | POA: Insufficient documentation

## 2019-01-27 DIAGNOSIS — G35 Multiple sclerosis: Secondary | ICD-10-CM | POA: Insufficient documentation

## 2019-01-27 DIAGNOSIS — I25119 Atherosclerotic heart disease of native coronary artery with unspecified angina pectoris: Principal | ICD-10-CM | POA: Insufficient documentation

## 2019-01-27 DIAGNOSIS — Z885 Allergy status to narcotic agent status: Secondary | ICD-10-CM | POA: Insufficient documentation

## 2019-01-27 DIAGNOSIS — Z87891 Personal history of nicotine dependence: Secondary | ICD-10-CM | POA: Insufficient documentation

## 2019-01-27 DIAGNOSIS — E739 Lactose intolerance, unspecified: Secondary | ICD-10-CM | POA: Insufficient documentation

## 2019-01-27 DIAGNOSIS — Z9884 Bariatric surgery status: Secondary | ICD-10-CM | POA: Diagnosis not present

## 2019-01-27 DIAGNOSIS — M542 Cervicalgia: Secondary | ICD-10-CM | POA: Diagnosis not present

## 2019-01-27 DIAGNOSIS — R0602 Shortness of breath: Secondary | ICD-10-CM | POA: Diagnosis not present

## 2019-01-27 DIAGNOSIS — Z86010 Personal history of colonic polyps: Secondary | ICD-10-CM | POA: Diagnosis not present

## 2019-01-27 DIAGNOSIS — N183 Chronic kidney disease, stage 3 unspecified: Secondary | ICD-10-CM | POA: Insufficient documentation

## 2019-01-27 DIAGNOSIS — I5022 Chronic systolic (congestive) heart failure: Secondary | ICD-10-CM | POA: Diagnosis not present

## 2019-01-27 DIAGNOSIS — Z833 Family history of diabetes mellitus: Secondary | ICD-10-CM | POA: Insufficient documentation

## 2019-01-27 DIAGNOSIS — K219 Gastro-esophageal reflux disease without esophagitis: Secondary | ICD-10-CM | POA: Insufficient documentation

## 2019-01-27 DIAGNOSIS — Z85820 Personal history of malignant melanoma of skin: Secondary | ICD-10-CM | POA: Diagnosis not present

## 2019-01-27 DIAGNOSIS — Z808 Family history of malignant neoplasm of other organs or systems: Secondary | ICD-10-CM | POA: Insufficient documentation

## 2019-01-27 DIAGNOSIS — Z20828 Contact with and (suspected) exposure to other viral communicable diseases: Secondary | ICD-10-CM | POA: Diagnosis not present

## 2019-01-27 DIAGNOSIS — Z818 Family history of other mental and behavioral disorders: Secondary | ICD-10-CM | POA: Insufficient documentation

## 2019-01-27 DIAGNOSIS — I160 Hypertensive urgency: Secondary | ICD-10-CM | POA: Insufficient documentation

## 2019-01-27 DIAGNOSIS — Z823 Family history of stroke: Secondary | ICD-10-CM | POA: Insufficient documentation

## 2019-01-27 DIAGNOSIS — Z800 Family history of malignant neoplasm of digestive organs: Secondary | ICD-10-CM | POA: Insufficient documentation

## 2019-01-27 DIAGNOSIS — E1122 Type 2 diabetes mellitus with diabetic chronic kidney disease: Secondary | ICD-10-CM | POA: Insufficient documentation

## 2019-01-27 DIAGNOSIS — R079 Chest pain, unspecified: Secondary | ICD-10-CM | POA: Diagnosis present

## 2019-01-27 DIAGNOSIS — I252 Old myocardial infarction: Secondary | ICD-10-CM | POA: Insufficient documentation

## 2019-01-27 DIAGNOSIS — Z8249 Family history of ischemic heart disease and other diseases of the circulatory system: Secondary | ICD-10-CM | POA: Insufficient documentation

## 2019-01-27 DIAGNOSIS — Z9049 Acquired absence of other specified parts of digestive tract: Secondary | ICD-10-CM | POA: Diagnosis not present

## 2019-01-27 DIAGNOSIS — Z8041 Family history of malignant neoplasm of ovary: Secondary | ICD-10-CM | POA: Insufficient documentation

## 2019-01-27 DIAGNOSIS — Z794 Long term (current) use of insulin: Secondary | ICD-10-CM | POA: Insufficient documentation

## 2019-01-27 DIAGNOSIS — E039 Hypothyroidism, unspecified: Secondary | ICD-10-CM | POA: Insufficient documentation

## 2019-01-27 DIAGNOSIS — Z803 Family history of malignant neoplasm of breast: Secondary | ICD-10-CM | POA: Insufficient documentation

## 2019-01-27 DIAGNOSIS — Z7901 Long term (current) use of anticoagulants: Secondary | ICD-10-CM | POA: Insufficient documentation

## 2019-01-27 DIAGNOSIS — I130 Hypertensive heart and chronic kidney disease with heart failure and stage 1 through stage 4 chronic kidney disease, or unspecified chronic kidney disease: Secondary | ICD-10-CM | POA: Insufficient documentation

## 2019-01-27 DIAGNOSIS — R069 Unspecified abnormalities of breathing: Secondary | ICD-10-CM | POA: Diagnosis not present

## 2019-01-27 DIAGNOSIS — Z841 Family history of disorders of kidney and ureter: Secondary | ICD-10-CM | POA: Insufficient documentation

## 2019-01-27 DIAGNOSIS — Z8349 Family history of other endocrine, nutritional and metabolic diseases: Secondary | ICD-10-CM | POA: Insufficient documentation

## 2019-01-27 DIAGNOSIS — Z955 Presence of coronary angioplasty implant and graft: Secondary | ICD-10-CM | POA: Diagnosis not present

## 2019-01-27 DIAGNOSIS — R110 Nausea: Secondary | ICD-10-CM | POA: Diagnosis not present

## 2019-01-27 DIAGNOSIS — I2510 Atherosclerotic heart disease of native coronary artery without angina pectoris: Secondary | ICD-10-CM | POA: Diagnosis present

## 2019-01-27 DIAGNOSIS — I255 Ischemic cardiomyopathy: Secondary | ICD-10-CM | POA: Insufficient documentation

## 2019-01-27 LAB — COMPREHENSIVE METABOLIC PANEL
ALT: 28 U/L (ref 0–44)
AST: 34 U/L (ref 15–41)
Albumin: 3.2 g/dL — ABNORMAL LOW (ref 3.5–5.0)
Alkaline Phosphatase: 115 U/L (ref 38–126)
Anion gap: 11 (ref 5–15)
BUN: 29 mg/dL — ABNORMAL HIGH (ref 6–20)
CO2: 21 mmol/L — ABNORMAL LOW (ref 22–32)
Calcium: 7.9 mg/dL — ABNORMAL LOW (ref 8.9–10.3)
Chloride: 99 mmol/L (ref 98–111)
Creatinine, Ser: 2.09 mg/dL — ABNORMAL HIGH (ref 0.44–1.00)
GFR calc Af Amer: 31 mL/min — ABNORMAL LOW (ref >60)
GFR calc non Af Amer: 27 mL/min — ABNORMAL LOW (ref >60)
Glucose, Bld: 87 mg/dL (ref 70–99)
Potassium: 3.3 mmol/L — ABNORMAL LOW (ref 3.5–5.1)
Sodium: 131 mmol/L — ABNORMAL LOW (ref 135–145)
Total Bilirubin: 0.4 mg/dL (ref 0.3–1.2)
Total Protein: 6.5 g/dL (ref 6.5–8.1)

## 2019-01-27 LAB — CBC WITH DIFFERENTIAL/PLATELET
Abs Immature Granulocytes: 0.03 10*3/uL (ref 0.00–0.07)
Basophils Absolute: 0.1 10*3/uL (ref 0.0–0.1)
Basophils Relative: 1 %
Eosinophils Absolute: 0.3 10*3/uL (ref 0.0–0.5)
Eosinophils Relative: 3 %
HCT: 28 % — ABNORMAL LOW (ref 36.0–46.0)
Hemoglobin: 8.8 g/dL — ABNORMAL LOW (ref 12.0–15.0)
Immature Granulocytes: 0 %
Lymphocytes Relative: 24 %
Lymphs Abs: 2.6 10*3/uL (ref 0.7–4.0)
MCH: 20.8 pg — ABNORMAL LOW (ref 26.0–34.0)
MCHC: 31.4 g/dL (ref 30.0–36.0)
MCV: 66 fL — ABNORMAL LOW (ref 80.0–100.0)
Monocytes Absolute: 0.8 10*3/uL (ref 0.1–1.0)
Monocytes Relative: 7 %
Neutro Abs: 7.2 10*3/uL (ref 1.7–7.7)
Neutrophils Relative %: 65 %
Platelets: 177 10*3/uL (ref 150–400)
RBC: 4.24 MIL/uL (ref 3.87–5.11)
RDW: 16.5 % — ABNORMAL HIGH (ref 11.5–15.5)
WBC: 11 10*3/uL — ABNORMAL HIGH (ref 4.0–10.5)
nRBC: 0 % (ref 0.0–0.2)

## 2019-01-27 LAB — BRAIN NATRIURETIC PEPTIDE: B Natriuretic Peptide: 40 pg/mL (ref 0.0–100.0)

## 2019-01-27 LAB — LIPASE, BLOOD: Lipase: 40 U/L (ref 11–51)

## 2019-01-27 LAB — TROPONIN I (HIGH SENSITIVITY): Troponin I (High Sensitivity): 13 ng/L (ref <18)

## 2019-01-27 LAB — PROCALCITONIN: Procalcitonin: <0.1 ng/mL

## 2019-01-27 MED ORDER — ENOXAPARIN SODIUM 40 MG/0.4ML ~~LOC~~ SOLN: 40.0000 mg | SUBCUTANEOUS | Status: DC

## 2019-01-27 MED ORDER — ASPIRIN 300 MG RE SUPP: 300.0000 mg | RECTAL | Status: DC

## 2019-01-27 MED ORDER — FERROUS SULFATE 325 (65 FE) MG PO TABS
325.0000 mg | ORAL_TABLET | Freq: Every day | ORAL | Status: DC
  Filled 2019-01-27: qty 1

## 2019-01-27 MED ORDER — ASPIRIN EC 81 MG PO TBEC
81.0000 mg | DELAYED_RELEASE_TABLET | Freq: Every day | ORAL | Status: DC
  Administered 2019-01-28: 81 mg via ORAL
  Filled 2019-01-27: qty 1

## 2019-01-27 MED ORDER — ONDANSETRON HCL 4 MG/2ML IJ SOLN: 4.0000 mg | Freq: Four times a day (QID) | INTRAMUSCULAR | Status: DC | PRN

## 2019-01-27 MED ORDER — AMLODIPINE BESYLATE 5 MG PO TABS
10.0000 mg | ORAL_TABLET | Freq: Every day | ORAL | Status: DC
  Administered 2019-01-28: 10 mg via ORAL
  Filled 2019-01-27: qty 2

## 2019-01-27 MED ORDER — ISOSORBIDE MONONITRATE ER 60 MG PO TB24
60.0000 mg | ORAL_TABLET | Freq: Every day | ORAL | Status: DC
  Administered 2019-01-28: 60 mg via ORAL
  Filled 2019-01-27: qty 1

## 2019-01-27 MED ORDER — CLOPIDOGREL BISULFATE 75 MG PO TABS
75.0000 mg | ORAL_TABLET | Freq: Every day | ORAL | Status: DC
  Administered 2019-01-28: 75 mg via ORAL
  Filled 2019-01-27: qty 1

## 2019-01-27 MED ORDER — ASPIRIN 81 MG PO CHEW: 324.0000 mg | CHEWABLE_TABLET | ORAL | Status: DC

## 2019-01-27 MED ORDER — FUROSEMIDE 40 MG PO TABS
20.0000 mg | ORAL_TABLET | Freq: Every day | ORAL | Status: DC
  Administered 2019-01-28: 20 mg via ORAL
  Filled 2019-01-27: qty 1

## 2019-01-27 MED ORDER — LEVOTHYROXINE SODIUM 50 MCG PO TABS
25.0000 ug | ORAL_TABLET | Freq: Every day | ORAL | Status: DC
  Administered 2019-01-28: 25 ug via ORAL
  Filled 2019-01-27: qty 1

## 2019-01-27 MED ORDER — CARVEDILOL 6.25 MG PO TABS
12.5000 mg | ORAL_TABLET | Freq: Two times a day (BID) | ORAL | Status: DC
  Administered 2019-01-28: 12.5 mg via ORAL
  Filled 2019-01-27: qty 2

## 2019-01-27 MED ORDER — PANTOPRAZOLE SODIUM 40 MG PO TBEC
40.0000 mg | DELAYED_RELEASE_TABLET | Freq: Two times a day (BID) | ORAL | Status: DC
  Administered 2019-01-28: 40 mg via ORAL
  Filled 2019-01-27 (×2): qty 1

## 2019-01-27 MED ORDER — SODIUM CHLORIDE 0.9 % IV BOLUS
1000.0000 mL | Freq: Once | INTRAVENOUS | Status: AC
  Administered 2019-01-28: 1000 mL via INTRAVENOUS

## 2019-01-27 MED ORDER — LOSARTAN POTASSIUM 50 MG PO TABS
50.0000 mg | ORAL_TABLET | Freq: Every day | ORAL | Status: DC
  Administered 2019-01-28: 50 mg via ORAL
  Filled 2019-01-27: qty 1

## 2019-01-27 MED ORDER — INSULIN ASPART 100 UNIT/ML ~~LOC~~ SOLN: 0.0000 [IU] | Freq: Three times a day (TID) | SUBCUTANEOUS | Status: DC

## 2019-01-27 MED ORDER — ACETAMINOPHEN 325 MG PO TABS: 650.0000 mg | ORAL_TABLET | ORAL | Status: DC | PRN

## 2019-01-27 MED ORDER — POTASSIUM CHLORIDE CRYS ER 20 MEQ PO TBCR
10.0000 meq | EXTENDED_RELEASE_TABLET | Freq: Every day | ORAL | Status: DC
  Administered 2019-01-28: 10 meq via ORAL
  Filled 2019-01-27: qty 1

## 2019-01-27 MED ORDER — NITROGLYCERIN 0.4 MG SL SUBL: 0.4000 mg | SUBLINGUAL_TABLET | SUBLINGUAL | Status: DC | PRN

## 2019-01-27 MED ORDER — ROSUVASTATIN CALCIUM 20 MG PO TABS: 40.0000 mg | ORAL_TABLET | Freq: Every day | ORAL | Status: DC

## 2019-01-27 MED ORDER — MORPHINE SULFATE (PF) 4 MG/ML IV SOLN
4.0000 mg | Freq: Once | INTRAVENOUS | Status: AC
  Administered 2019-01-27: 4 mg via INTRAVENOUS
  Filled 2019-01-27: qty 1

## 2019-01-27 MED ORDER — VENLAFAXINE HCL ER 75 MG PO CP24
75.0000 mg | ORAL_CAPSULE | Freq: Every day | ORAL | Status: DC
  Administered 2019-01-28: 75 mg via ORAL
  Filled 2019-01-27: qty 1

## 2019-01-27 NOTE — ED Triage Notes (Signed)
Pt from home via [facility]. Per EMS, pt seen at [facility] for MI on 12/16 no catheretization; anticoagulation tx only. Pt st feeling SOB, " I left the hospital"; Nauseous "all day"; left sided Neck pain since 1330. Pt c/o left sided CP.NAD noted by this RN upon arrival. Given in route 324 ASA; 4 mg zofran; 2 doses nitro. CBG 202

## 2019-01-27 NOTE — H&P (Signed)
History and Physical    [name] WGN:[id number] DOB: Aug 18, 1970 DOA: 01/27/2019  PCP: [name], MD  Patient coming from: home  I have personally briefly reviewed patient's old medical records in [place]  Chief Complaint: left jaw and chest pain  HPI: [name] is a 48 y.o. female with medical history significant of known history ofcoronary artery disease with hx of stent in LAD, last cath 09/2017 with ballon angioplasty of ostial second diagonal without stent placement, CKD 3 type 2 diabetes mellitus, hypertension, hypothyroidism, dyslipidemia and bipolar disorder, colic heart failure hospitalized from 01/19/2019 01/23/2019 with chest pain slight troponin elevation with flat trend which was thought secondary to demand ischemia from hypertensive emergency, echo showing EF 65% up from 34 to 50%, discharged with 1 week follow-up to cardiology presents to the emergency room with sudden onset left-sided jaw pain radiating to the left jaw and mid chest, while at rest.  Pain started couple hours prior to admission and continued at a severe intensity.  He had no associated nausea vomiting or diaphoresis did some shortness of breath.  At the time of evaluation patient stated her pain had improved a mild intensity with the treatment in the emergency room.  In her recent hospitalization she had a CTA of her chest that was negative for PE.  Cardiogram as mentioned above showed improved EF of 60 to 65%.  She reports no aggravating or alleviating factors.   ED Course: On arrival in the emergency room, blood pressure was 145/93 with otherwise normal vitals with O2 sat of 98% on room air.  BNP was elevated at 40, improved sent past hospitalization.  Phone and was 13 which was down from 7 troponin of 21.  Hemoglobin was 8.8 and stable compared to recent hospitalization.  EKG showed no acute ST-T wave changes.  Was administered morphine improvement in her pain. Observation was  requested as patient did still continue to have chest discomfort.  Emergency room provider, Dr. Romona Curls spoke with on-call cardiologist Dr. Fletcher Anon who recommended observation and trending troponins. No recommendation for IV heparin or nitroglycerin or other cardiac intervention at this time.  At The time of admission patient appeared comfortable Review of Systems: As per HPI otherwise 10 point review of systems negative.   Past Medical History:  Diagnosis Date  . Acute pyelonephritis   . Anemia    iron deficiency anemia  . Aortic arch aneurysm ([place])   . Bipolar disorder ([place])   . BRCA negative 2014  . CAD (coronary artery disease)    a. 08/2003 Cath: LAD 30-40-med Rx; b. 11/2014 PCI: LAD 75m(3.25x23 Xience Alpine DES); c. 06/2015 PCI: D1 (2.25x12 Resolute Integrity DES); d. 06/2017 PCI: Patent mLAD stent, D2 95 (PTCA); e. 09/2017 PCI: D2 99ost (CBA); d. 12/2017 Cath: LM nl, LAD 333m80d (small), D1 40ost, D2 95ost, LCX 40p, RCA 40ost/p->Med rx for D2 given restenosis.  . CKD (chronic kidney disease), stage III   . Colon polyp   . CVA (cerebral vascular accident) ([place]   Left side weakness.   . Diabetes ([place]  . Family history of breast cancer    BRCA neg 2014  . Gastric ulcer 04/27/2011  . History of echocardiogram    a. 03/2017 Echo: EF 60-65%, no rwma; b. 02/2018 Echo: EF 60-65%, no rwma. Nl RV fxn. No cardiac source of emboli (admitted w/ stroke).  . [place] (hypertension)   . Hyperlipemia   . Hypothyroid   .  Malignant melanoma of skin of scalp ([place])   . MI, acute, non ST segment elevation ([place])   . Neuromuscular disorder ([place])   . Orthostatic hypotension   . S/P drug eluting coronary stent placement 06/04/2015  . Sepsis ([place]) 03/18/2017  . Stroke [facility])    a. 02/2018 MRI: 27m late acute/early subacute L medial frontal lobe inarct; b. 02/2018 MRA No large vessel occlusion or aneurysm. Mod to sev L P2 stenosis. thready L vertebral artery, diffusely dzs'd; c. 02/2018 Carotid U/S: <50% bilat ICA  dzs.    Past Surgical History:  Procedure Laterality Date  . APPENDECTOMY    . CARDIAC CATHETERIZATION N/A 11/09/2014   Procedure: Coronary Angiography;  Surgeon: TMinna Merritts MD;  [facility];  Service: Cardiovascular;  Laterality: N/A;  . CARDIAC CATHETERIZATION N/A 11/12/2014   Procedure: Coronary Stent Intervention;  Surgeon: AIsaias Cowman MD;  [place];  Service: Cardiovascular;  Laterality: N/A;  . CARDIAC CATHETERIZATION N/A 04/18/2015   Procedure: Left Heart Cath and Coronary Angiography;  Surgeon: TMinna Merritts MD;  [place];  Service: Cardiovascular;  Laterality: N/A;  . CARDIAC CATHETERIZATION Left 06/04/2015   Procedure: Left Heart Cath and Coronary Angiography;  Surgeon: MWellington Hampshire MD;  [facility];  Service: Cardiovascular;  Laterality: Left;  . CARDIAC CATHETERIZATION N/A 06/04/2015   Procedure: Coronary Stent Intervention;  Surgeon: MWellington Hampshire MD;  [place];  Service: Cardiovascular;  Laterality: N/A;  . CESAREAN SECTION  2001  . CHOLECYSTECTOMY N/A 11/18/2016   Procedure: LAPAROSCOPIC CHOLECYSTECTOMY WITH INTRAOPERATIVE CHOLANGIOGRAM;  Surgeon: SChristene Lye MD;  Location: [facility];  Service: General;  Laterality: N/A;  . COLONOSCOPY WITH PROPOFOL N/A 04/27/2016   Procedure: COLONOSCOPY WITH PROPOFOL;  Surgeon: DLucilla Lame MD;  [place]  Service: Endoscopy;  Laterality: N/A;  . COLONOSCOPY WITH PROPOFOL N/A 01/12/2018   Procedure: COLONOSCOPY WITH PROPOFOL;  Surgeon: Tol[place], TBenay Pike MD;  Location: [facility];  Service: Endoscopy;  Laterality: N/A;  . CORONARY ANGIOPLASTY    . CORONARY BALLOON ANGIOPLASTY N/A 06/29/2017   Procedure: CORONARY BALLOON ANGIOPLASTY;  Surgeon: AWellington Hampshire MD;  [place];  Service: Cardiovascular;  Laterality: N/A;  . CORONARY BALLOON ANGIOPLASTY N/A  09/20/2017   Procedure: CORONARY BALLOON ANGIOPLASTY;  Surgeon: AWellington Hampshire MD;  [place];  Service: Cardiovascular;  Laterality: N/A;  . DILATION AND CURETTAGE OF UTERUS    . ESOPHAGOGASTRODUODENOSCOPY (EGD) WITH PROPOFOL N/A 09/14/2014   Procedure: ESOPHAGOGASTRODUODENOSCOPY (EGD) WITH PROPOFOL;  Surgeon: MJosefine Class MD;  [facility];  Service: Endoscopy;  Laterality: N/A;  . ESOPHAGOGASTRODUODENOSCOPY (EGD) WITH PROPOFOL N/A 04/27/2016   Procedure: ESOPHAGOGASTRODUODENOSCOPY (EGD) WITH PROPOFOL;  Surgeon: DLucilla Lame MD;  [place]  Service: Endoscopy;  Laterality: N/A;  Diabetic - oral meds  . ESOPHAGOGASTRODUODENOSCOPY (EGD) WITH PROPOFOL N/A 01/12/2018   Procedure: ESOPHAGOGASTRODUODENOSCOPY (EGD) WITH PROPOFOL;  Surgeon: Toledo, TBenay Pike MD;  Location: [facility];  Service: Endoscopy;  Laterality: N/A;  . GASTRIC BYPASS  09/2009   [place]) NAIL INTERTROCHANTERIC Left 10/13/2018   Procedure: INTRAMEDULLARY (IM) NAIL INTERTROCHANTRIC;  Surgeon: XLeandrew Koyanagi MD;  [place]  Service: Orthopedics;  Laterality: Left;  . Left Carotid to sublcavian artery bypass w/ subclavian artery ligation     a. Performed @ [place]  . LEFT HEART CATH AND  CORONARY ANGIOGRAPHY Left 06/29/2017   Procedure: LEFT HEART CATH AND CORONARY ANGIOGRAPHY;  Surgeon: Wellington Hampshire, MD;  Location: [place] CV LAB;  Service: Cardiovascular;  Laterality: Left;  . LEFT HEART CATH AND CORONARY ANGIOGRAPHY N/A 09/20/2017   Procedure: LEFT HEART CATH AND CORONARY ANGIOGRAPHY;  Surgeon: Wellington Hampshire, MD;  Location: [place] CV LAB;  Service: Cardiovascular;  Laterality: N/A;  . LEFT HEART CATH AND CORONARY ANGIOGRAPHY N/A 12/20/2017   Procedure: LEFT HEART CATH AND CORONARY ANGIOGRAPHY;  Surgeon: Wellington Hampshire, MD;  Location: [place] CV LAB;  Service: Cardiovascular;  Laterality:  N/A;  . MELANOMA EXCISION  2016   Dr. Evorn Gong  . [place]  2002  . RIGHT OOPHORECTOMY    . SHOULDER ARTHROSCOPY WITH OPEN ROTATOR CUFF REPAIR Right 01/07/2016   Procedure: SHOULDER ARTHROSCOPY WITH DEBRIDMENT, SUBACHROMIAL DECOMPRESSION;  Surgeon: Corky Mull, MD;  Location: [facility];  Service: Orthopedics;  Laterality: Right;  . SHOULDER ARTHROSCOPY WITH OPEN ROTATOR CUFF REPAIR Right 03/16/2017   Procedure: SHOULDER ARTHROSCOPY WITH OPEN ROTATOR CUFF REPAIR POSSIBLE BICEPS TENODESIS;  Surgeon: Corky Mull, MD;  Location: [facility];  Service: Orthopedics;  Laterality: Right;  . TRIGGER FINGER RELEASE Right     [place]     reports that she quit smoking about 24 years ago. Her smoking use included cigarettes. She has never used smokeless tobacco. She reports that she does not drink alcohol or use drugs.  Allergies  Allergen Reactions  . Lipitor [Atorvastatin] Other (See Comments)    Leg pains  . Tramadol Other (See Comments)    Mouth feels like it's on fire  . Lactose Intolerance (Gi) Nausea Only    Family History  Problem Relation Age of Onset  . Hypertension Mother   . Anxiety disorder Mother   . Depression Mother   . Bipolar disorder Mother   . Heart disease Mother        No details  . Hyperlipidemia Mother   . Kidney disease Father   . Heart disease Father 45  . Hypertension Father   . Diabetes Father   . Stroke Father   . Colon cancer Father        dx in his 45's  . Anxiety disorder Father   . Depression Father   . Skin cancer Father   . Kidney disease Sister   . Thyroid nodules Sister   . Hypertension Sister   . Hypertension Sister   . Diabetes Sister   . Hyperlipidemia Sister   . Depression Sister   . Breast cancer Maternal Aunt 2  . Breast cancer Maternal Aunt 12  . Ovarian cancer Cousin   . Colon cancer Cousin   . Breast cancer Other   . Kidney cancer Neg Hx   . Bladder Cancer Neg Hx      Prior to Admission medications   Medication  Sig Start Date End Date Taking? Authorizing Provider  ALPRAZolam Duanne Moron) 1 MG tablet TAKE 1 TABLET BY MOUTH ONCE OR TWICE A DAY FOR ANXIETY 12/09/18   [name], MD  amLODipine (NORVASC) 10 MG tablet Take 1 tablet (10 mg total) by mouth daily. 01/23/19 02/22/19  Wyvonnia Dusky, MD  aspirin EC 81 MG EC tablet Take 1 tablet (81 mg total) by mouth daily for 30 doses. 01/23/19 02/22/19  Wyvonnia Dusky, MD  buPROPion (WELLBUTRIN XL) 150 MG 24 hr tablet TAKE 1 TABLET BY MOUTH EVERY DAY Patient taking differently: Take 150 mg  by mouth daily.  09/27/18   [name], MD  carvedilol (COREG) 12.5 MG tablet Take 1 tablet (12.5 mg total) by mouth 2 (two) times daily with a meal. 01/23/19 02/22/19  Wyvonnia Dusky, MD  clopidogrel (PLAVIX) 75 MG tablet Take 1 tablet (75 mg total) by mouth daily. 07/08/18   Theora Gianotti, NP  diphenoxylate-atropine (LOMOTIL) 2.5-0.025 MG tablet Take one capsule 1-4 times daily as needed for diarrhea Patient taking differently: Take 1-4 tablets by mouth daily. Take one capsule 1-4 times daily for diarrhea 01/10/19   [name], MD  ferrous sulfate 325 (65 FE) MG tablet Take 1 tablet (325 mg total) by mouth daily with breakfast. 01/23/19 02/22/19  Wyvonnia Dusky, MD  furosemide (LASIX) 20 MG tablet Take 1 tablet (20 mg total) by mouth daily. 01/23/19 02/22/19  Wyvonnia Dusky, MD  gabapentin (NEURONTIN) 300 MG capsule Take 1 capsule (300 mg total) by mouth 2 (two) times daily. 10/12/18   [name], MD  Galcanezumab-gnlm (EMGALITY) 120 MG/ML SOAJ INJECT 240 MG SUBCUTANEOUSLY AS DIRECTED FOR THE FIRST MONTH. 11/11/18   [provider]  isosorbide mononitrate (IMDUR) 60 MG 24 hr tablet Take 1 tablet (60 mg total) by mouth daily. 01/23/19 02/22/19  Wyvonnia Dusky, MD  levothyroxine (SYNTHROID, LEVOTHROID) 25 MCG tablet Take 1 tablet (25 mcg total) by mouth daily before breakfast. 03/28/18   [name], MD  losartan  (COZAAR) 50 MG tablet Take 1 tablet (50 mg total) by mouth daily. 01/23/19 02/22/19  Wyvonnia Dusky, MD  metFORMIN (GLUCOPHAGE-XR) 500 MG 24 hr tablet TAKE 1 TABLET BY MOUTH ONCE DAILY WITH DINNER 11/02/18   [provider]  nitroGLYCERIN (NITROSTAT) 0.4 MG SL tablet Place 1 tablet (0.4 mg total) under the tongue every 5 (five) minutes as needed for chest pain. 06/02/17   Minna Merritts, MD  pantoprazole (PROTONIX) 40 MG tablet Take 1 tablet (40 mg total) by mouth 2 (two) times daily. 03/02/18   [name], MD  potassium chloride (KLOR-CON) 10 MEQ tablet Take 1 tablet (10 mEq total) by mouth daily. 01/23/19 02/22/19  Wyvonnia Dusky, MD  promethazine (PHENERGAN) 25 MG tablet Take 1 tablet (25 mg total) by mouth every 6 (six) hours as needed for nausea or vomiting. 11/07/18   [name], MD  rosuvastatin (CRESTOR) 40 MG tablet Take 1 tablet (40 mg total) by mouth daily. 10/12/18   [name], MD  venlafaxine XR (EFFEXOR-XR) 75 MG 24 hr capsule Take 1 capsule (75 mg total) by mouth daily with breakfast. 09/27/18   [name], MD    Physical Exam: Vitals:   01/27/19 2015 01/27/19 2030 01/27/19 2130 01/27/19 2200  BP: 136/83 (!) 145/84 (!) 145/93 127/82  Pulse: 75 71 66 69  Resp: 20 (!) _0 Temp:      TempSrc:      SpO2: 98% 97% 100% 99%  Weight: 68.9 kg     Height: _1  (1.575 m)        Vitals:   01/27/19 2015 01/27/19 2030 01/27/19 2130 01/27/19 2200  BP: 136/83 (!) 145/84 (!) 145/93 127/82  Pulse: 75 71 66 69  Resp: 20 (!) _2 Temp:      TempSrc:      SpO2: 98% 97% 100% 99%  Weight: 68.9 kg     Height: _3  (1.575 m)       Constitutional: NAD, calm, comfortable  Eyes: PERRL, lids and conjunctivae normal ENMT: Mucous membranes are moist. Posterior pharynx clear of any exudate or lesions.Normal dentition.  Neck: normal, supple, no masses, no thyromegaly Respiratory: clear to auscultation bilaterally, no wheezing, no crackles. Normal  respiratory effort. No accessory muscle use.  Cardiovascular: Regular rate and rhythm, no murmurs / rubs / gallops. No extremity edema. 2+ pedal pulses. No carotid bruits.  Abdomen: no tenderness, no masses palpated. No hepatosplenomegaly. Bowel sounds positive.  Musculoskeletal: no clubbing / cyanosis. No joint deformity upper and lower extremities. Good ROM, no contractures. Normal muscle tone.  Skin: no rashes, lesions, ulcers. No induration Neurologic: CN 2-12 grossly intact. Sensation intact, DTR normal. Strength 5/5 in all 4.  Psychiatric: Normal judgment and insight. Alert and oriented x 3. Normal mood.    Labs on Admission: I have personally reviewed following labs and imaging studies  CBC: Recent Labs  Lab 01/21/19 0454 01/22/19 0618 01/23/19 0440 01/27/19 2107  WBC 8.0 7.3 8.5 11.0*  NEUTROABS  --   --   --  7.2  HGB 8.2* 8.5* 8.8* 8.8*  HCT 27.2* 27.3* 29.3* 28.0*  MCV 69.4* 66.1* 67.7* 66.0*  PLT 159 158 193 638   Basic Metabolic Panel: Recent Labs  Lab 01/21/19 0454 01/22/19 0618 01/23/19 0440 01/27/19 2107  NA 141 137 135 131*  K 3.6 4.4 4.1 3.3*  CL 108 104 101 99  CO2 _0 21*  GLUCOSE 70 82 80 87  BUN 23* 24* 24* 29*  CREATININE 1.55* 1.69* 1.67* 2.09*  CALCIUM 8.3* 8.4* 8.6* 7.9*   GFR: Estimated Creatinine Clearance: 29.3 mL/min (A) (by C-G formula based on SCr of 2.09 mg/dL (H)). Liver Function Tests: Recent Labs  Lab 01/27/19 2107  AST 34  ALT 28  ALKPHOS 115  BILITOT 0.4  PROT 6.5  ALBUMIN 3.2*   Recent Labs  Lab 01/27/19 2107  LIPASE 40   No results for input(s): AMMONIA in the last 168 hours. Coagulation Profile: No results for input(s): INR, PROTIME in the last 168 hours. Cardiac Enzymes: No results for input(s): CKTOTAL, CKMB, CKMBINDEX, TROPONINI in the last 168 hours. BNP (last 3 results) No results for input(s): PROBNP in the last 8760 hours. HbA1C: No results for input(s): HGBA1C in the last 72 hours. CBG: Recent  Labs  Lab 01/22/19 0801 01/22/19 1225 01/22/19 1703 01/22/19 2306 01/23/19 0735  GLUCAP 84 70 84 70 83   Lipid Profile: No results for input(s): CHOL, HDL, LDLCALC, TRIG, CHOLHDL, LDLDIRECT in the last 72 hours. Thyroid Function Tests: No results for input(s): TSH, T4TOTAL, FREET4, T3FREE, THYROIDAB in the last 72 hours. Anemia Panel: No results for input(s): VITAMINB12, FOLATE, FERRITIN, TIBC, IRON, RETICCTPCT in the last 72 hours. Urine analysis:    Component Value Date/Time   COLORURINE STRAW (A) 06/25/2018 2131   APPEARANCEUR HAZY (A) 06/25/2018 2131   APPEARANCEUR Cloudy (A) 07/17/2014 1455   LABSPEC 1.003 (L) 06/25/2018 2131   LABSPEC 1.012 12/29/2013 1210   PHURINE 6.0 06/25/2018 2131   GLUCOSEU NEGATIVE 06/25/2018 2131   GLUCOSEU >=500 12/29/2013 1210   HGBUR NEGATIVE 06/25/2018 2131   HGBUR negative 04/14/2010 1435   BILIRUBINUR negative 11/04/2018 1105   BILIRUBINUR Negative 07/17/2014 1455   BILIRUBINUR Negative 12/29/2013 [place] 06/25/2018 2131   PROTEINUR Positive (A) 11/04/2018 1105   PROTEINUR NEGATIVE 06/25/2018 2131   UROBILINOGEN 0.2 11/04/2018 1105   UROBILINOGEN negative 04/14/2010 1435   NITRITE positive 11/04/2018 1105   NITRITE NEGATIVE 06/25/2018 2131  LEUKOCYTESUR Large (3+) (A) 11/04/2018 1105   LEUKOCYTESUR NEGATIVE 06/25/2018 2131   LEUKOCYTESUR Negative 12/29/2013 1210    Radiological Exams on Admission: DG Chest 2 View  Result Date: 01/27/2019 CLINICAL DATA:  48 year old female with chest pain. EXAM: CHEST - 2 VIEW COMPARISON:  Chest radiograph dated 01/19/2019. FINDINGS: Shallow inspiration. Bilateral perihilar streaky densities, likely atelectasis. Developing infiltrate is less likely. Clinical correlation is recommended. No focal consolidation, pleural effusion, or pneumothorax. Stable cardiac silhouette. Surgical clips noted in the upper mediastinum. No acute osseous pathology. There is air distention the colon.  IMPRESSION: 1. No focal consolidation. 2. Bilateral perihilar streaky densities, likely atelectasis. Developing infiltrate is less likely. Electronically Signed   By: Anner Crete M.D.   On: 01/27/2019 21:17    EKG: Independently reviewed.  No acute ST-T wave changes  Assessment/Plan    Recurrent chest pain patient with a history of CAD history of stent angioplasty --Continue to trend troponins --cardiology consult -- Cardiac cath in august 2019 ---Cardiac catheterization September 20, 2017: "Significant one-vessel coronary artery disease with patent stents in the mid LAD and first diagonal.  significant chronic disease in the distal LAD close to the apex. Severe restenosis ostial second diagonal at the site of previous angioplasty in May 2019. Successful cutting balloon angioplasty of the ostial second diagonal without stent placement." --continue home asa, plavix, coreg, . Patient allergic to statins --NPO after midnight for possible procedure in the am    Type 2 diabetes mellitus with stage 3 chronic kidney disease, without long-term current use of insulin (HCC) --hemoglobin A1c in the past 2 weeks was 5.9 --supplemental insulin   CKD (chronic kidney disease), stage III (HCC) --Baseline   CAD in native artery   Chest pain  Essential hypertension --BP controlled. Continue amlodipine and coreg  Chronic stable iron deficiency anemia --continue iron --Patient heme-positive during her past hospitalization and has an outpatient GI appointment --Hemoglobin stable  Chronic systolic heart failure --Cardiogram done within the past month shows improved EF to 60-65, up from 35-40. --Continue Lasix, beta-blocker    DVT prophylaxis: lovenox Code Status: full  Disposition Plan: Back to previous home environment Consults called: Dr. Fletcher Anon Admission status: obs    Athena Masse MD [facility]   If 7PM-7AM, please contact night-coverage [email or web address] Password  [facility]  01/27/2019, 11:42 PM

## 2019-01-27 NOTE — ED Provider Notes (Signed)
[facility] Emergency Department Provider Note  ____________________________________________   First MD Initiated Contact with Patient 01/27/19 2021     (approximate)  I have reviewed the triage vital signs and the nursing notes.   HISTORY  Chief Complaint Shortness of Breath and Chest Pain    HPI [name] is a 48 y.o. female with hypertension, hyperlipidemia, stroke, coronary disease status post stent who comes in with chest pain.  Last time she was admitted in November 2019 she underwent balloon angioplasty even though her cardiac markers were negative.  Therefore when I saw her back on 12/17 I did admit her.  Patient was discharged on 12/21 after admitted her for chest pain.  I did do a CT PE that was negative.  Was felt that the chest pain was related to demand ischemia.  Her EF was 60 to 65%.  Patient was started on Lasix and potassium and also her amlodipine was increased Imdur was increased and metoprolol switched to Coreg.  Patient states that during her admission her chest pain did go away however it did restart today around 1:30 PM.  Her pain is on the left side of her chest and radiates up into her neck.  The pain was severe, intermittent, nothing makes it better, nothing makes it worse.  Associate with some nausea.  EMS already gave her aspirin and Zofran.  She has been compliant with her medication changes.  She says that she feels like she cannot catch her breath although satting 100%.  She denies any leg swelling.  Maybe some mild abdominal pain but denies any difficulties with stools or dysuria.          Past Medical History:  Diagnosis Date  . Acute pyelonephritis   . Anemia    iron deficiency anemia  . Aortic arch aneurysm ([place])   . Bipolar disorder ([place])   . BRCA negative 2014  . CAD (coronary artery disease)    a. 08/2003 Cath: LAD 30-40-med Rx; b. 11/2014 PCI: LAD 69m(3.25x23 Xience Alpine DES); c. 06/2015 PCI: D1  (2.25x12 Resolute Integrity DES); d. 06/2017 PCI: Patent mLAD stent, D2 95 (PTCA); e. 09/2017 PCI: D2 99ost (CBA); d. 12/2017 Cath: LM nl, LAD 363m80d (small), D1 40ost, D2 95ost, LCX 40p, RCA 40ost/p->Med rx for D2 given restenosis.  . CKD (chronic kidney disease), stage III   . Colon polyp   . CVA (cerebral vascular accident) (HCA[place]   Left side weakness.   . Diabetes ([address]  . Family history of breast cancer    BRCA neg 2014  . Gastric ulcer 04/27/2011  . History of echocardiogram    a. 03/2017 Echo: EF 60-65%, no rwma; b. 02/2018 Echo: EF 60-65%, no rwma. Nl RV fxn. No cardiac source of emboli (admitted w/ stroke).  . [place] (hypertension)   . Hyperlipemia   . Hypothyroid   . Malignant melanoma of skin of scalp ([place]  . MI, acute, non ST segment elevation ([place]CA[place]  . Neuromuscular disorder ([place]  . Orthostatic hypotension   . S/P drug eluting coronary stent placement 06/04/2015  . Sepsis (HCStover2/14/2019  . St[facility] ([facility]   a. 02/2018 MRI: 51m851mate acute/early subacute L medial frontal lobe inarct; b. 02/2018 MRA No large vessel occlusion or aneurysm. Mod to sev L P2 stenosis. thready L vertebral artery, diffusely dzs'd; c. 02/2018 Carotid U/S: <50% bilat ICA dzs.    Patient Active Problem List   Diagnosis  Date Noted  . Melena   . Hypertensive urgency   . Chest pain 01/19/2019  . Depression, major, single episode, moderate ([place]) 12/09/2018  . Fall   . Closed nondisplaced intertrochanteric fracture of left femur ([place]) 10/12/2018  . Femur fracture, left ([place]) 10/12/2018  . Hypotension 06/26/2018  . Autonomic neuropathy 03/24/2018  . Acute delirium 03/03/2018  . H/O gastric bypass 03/02/2018  . Hypertension associated with stage 3 chronic kidney disease due to type 2 diabetes mellitus ([place]) 02/20/2018  . SI (sacroiliac) joint dysfunction 12/02/2017  . MI, acute, non ST segment elevation ([place])   . Effort angina ([place]) 06/29/2017  . Unstable angina ([place]) 06/24/2017  . Syncope 04/09/2017    . Insomnia 03/18/2017  . Ischemic cardiomyopathy   . Arthritis   . Anxiety   . Tendinitis of upper biceps tendon of right shoulder 03/16/2017  . Degenerative tear of glenoid labrum of right shoulder 03/15/2017  . Acute colitis 01/27/2017  . Hx of colonic polyps   . H/O medication noncompliance 12/14/2015  . Emesis   . CAD in native artery   . Hypertensive heart disease   . CKD (chronic kidney disease), stage III ([place]) 06/05/2015  . Status post bariatric surgery 06/04/2015  . Colitis 06/03/2015  . Carotid stenosis 04/30/2015  . Type 2 diabetes mellitus with stage 3 chronic kidney disease, without long-term current use of insulin ([place])   . Stable angina pectoris ([place]) 04/17/2015  . Iron deficiency anemia 03/22/2015  . Vitamin B12 deficiency 02/18/2015  . Misuse of medications for pain 02/18/2015  . Major depressive disorder, recurrent, severe with psychotic features ([place]) 02/15/2015  . Helicobacter pylori infection 11/23/2014  . Hemiparesis, left ([place]) 11/23/2014  . Benign neoplasm of colon 11/20/2014  . Malignant melanoma ([place]) 08/25/2014  . Chronic systolic CHF (congestive heart failure) ([place])   . Incomplete bladder emptying 07/12/2014  . Hypothyroidism 12/30/2013  . Aberrant subclavian artery 11/17/2013  . Multiple sclerosis ([place]) 11/02/2013  . History of CVA with residual deficit 06/20/2013  . Headache, migraine 05/29/2013  . Hyperlipidemia   . GERD (gastroesophageal reflux disease)   . Neuropathy ([place]) 01/02/2011  . Stroke ([place]) 06/21/2008  . Depression with anxiety 05/01/2008  . Essential hypertension 05/01/2008    Past Surgical History:  Procedure Laterality Date  . APPENDECTOMY    . CARDIAC CATHETERIZATION N/A 11/09/2014   Procedure: Coronary Angiography;  Surgeon: Minna Merritts, MD;  Location: [place] [facility];  Service: Cardiovascular;  Laterality: N/A;  . CARDIAC CATHETERIZATION N/A 11/12/2014   Procedure: Coronary Stent Intervention;  Surgeon:  Isaias Cowman, MD;  Location: [place] CV LAB;  Service: Cardiovascular;  Laterality: N/A;  . CARDIAC CATHETERIZATION N/A 04/18/2015   Procedure: Left Heart Cath and Coronary Angiography;  Surgeon: Minna Merritts, MD;  Location: [place] CV [facility];  Service: Cardiovascular;  Laterality: N/A;  . CARDIAC CATHETERIZATION Left 06/04/2015   Procedure: Left Heart Cath and Coronary Angiography;  Surgeon: Wellington Hampshire, MD;  Location: [place] CV LAB;  Service: Cardiovascular;  Laterality: Left;  . CARDIAC CATHETERIZATION N/A 06/04/2015   Procedure: Coronary Stent Intervention;  Surgeon: Wellington Hampshire, MD;  Location: [place] CV LAB;  Service: Cardiovascular;  Laterality: N/A;  . CESAREAN SECTION  2001  . CHOLECYSTECTOMY N/A 11/18/2016   Procedure: LAPAROSCOPIC CHOLECYSTECTOMY WITH INTRAOPERATIVE CHOLANGIOGRAM;  Surgeon: Christene Lye, MD;  Location: [facility];  Service: General;  Laterality: N/A;  . COLONOSCOPY WITH PROPOFOL N/A 04/27/2016   Procedure: COLONOSCOPY WITH PROPOFOL;  Surgeon: [name], MD;  Location: [place];  Service: Endoscopy;  Laterality: N/A;  . COLONOSCOPY WITH PROPOFOL N/A 01/12/2018   Procedure: COLONOSCOPY WITH PROPOFOL;  Surgeon: Toledo, Benay Pike, MD;  Location: [facility] ENDOS[facility];  Service: Endoscopy;  Laterality: N/A;  . CORONARY ANGIOPLASTY    . CORONARY BALLOON ANGIOPLASTY N/A 06/29/2017   Procedure: CORONARY BALLOON ANGIOPLASTY;  Surgeon: Wellington Hampshire, MD;  Location: [place] [facility];  Service: Cardiovascular;  Laterality: N/A;  . CORONARY BALLOON ANGIOPLASTY N/A 09/20/2017   Procedure: CORONARY BALLOON ANGIOPLASTY;  Surgeon: Wellington Hampshire, MD;  Location: [place] [facility];  Service: Cardiovascular;  Laterality: N/A;  . DILATION AND CURETTAGE OF UTERUS    . ESOPHAGOGASTRODUODENOSCOPY (EGD) WITH PROPOFOL N/A 09/14/2014   Procedure: ESOPHAGOGASTRODUODENOSCOPY (EGD) WITH PROPOFOL;  Surgeon: Josefine Class, MD;   Location: [facility] ENDOSCOPY;  Service: Endoscopy;  Laterality: N/A;  . ESOPHAGOGASTRODUODENOSCOPY (EGD) WITH PROPOFOL N/A 04/27/2016   Procedure: ESOPHAGOGASTRODUODENOSCOPY (EGD) WITH PROPOFOL;  Surgeon: [name], MD;  Location: [place];  Service: Endoscopy;  Laterality: N/A;  Diabetic - oral meds  . ESOPHAGOGASTRODUODENOSCOPY (EGD) WITH PROPOFOL N/A 01/12/2018   Procedure: ESOPHAGOGASTRODUODENOSCOPY (EGD) WITH PROPOFOL;  Surgeon: Toledo, Benay Pike, MD;  Location: [facility] ENDOS[facility];  Service: Endoscopy;  Laterality: N/A;  . GASTRIC BYPASS  09/2009   [place] (IM) NAIL INTERTROCHANTERIC Left 10/13/2018   Procedure: INTRAMEDULLARY (IM) NAIL INTERTROCHANTRIC;  Surgeon: Leandrew Koyanagi, MD;  Location: [place];  Service: Orthopedics;  Laterality: Left;  . Left Carotid to sublcavian artery bypass w/ subclavian artery ligation     a. Performed @ [place].  . LEFT HEART CATH AND CORONARY ANGIOGRAPHY Left 06/29/2017   Procedure: LEFT HEART CATH AND CORONARY ANGIOGRAPHY;  Surgeon: Wellington Hampshire, MD;  Location: [place] CV LAB;  Service: Cardiovascular;  Laterality: Left;  . LEFT HEART CATH AND CORONARY ANGIOGRAPHY N/A 09/20/2017   Procedure: LEFT HEART CATH AND CORONARY ANGIOGRAPHY;  Surgeon: Wellington Hampshire, MD;  Location: [place] [facility];  Service: Cardiovascular;  Laterality: N/A;  . LEFT HEART CATH AND CORONARY ANGIOGRAPHY N/A 12/20/2017   Procedure: LEFT HEART CATH AND CORONARY ANGIOGRAPHY;  Surgeon: Wellington Hampshire, MD;  Location: [place] [facility];  Service: Cardiovascular;  Laterality: N/A;  . MELANOMA EXCISION  2016   Dr. Evorn Gong  . [place]  2002  . RIGHT OOPHORECTOMY    . SHOULDER ARTHROSCOPY WITH OPEN ROTATOR CUFF REPAIR Right 01/07/2016   Procedure: SHOULDER ARTHROSCOPY WITH DEBRIDMENT, SUBACHROMIAL DECOMPRESSION;  Surgeon: Corky Mull, MD;  Location: [facility];  Service: Orthopedics;  Laterality: Right;  . SHOULDER  ARTHROSCOPY WITH OPEN ROTATOR CUFF REPAIR Right 03/16/2017   Procedure: SHOULDER ARTHROSCOPY WITH OPEN ROTATOR CUFF REPAIR POSSIBLE BICEPS TENODESIS;  Surgeon: Corky Mull, MD;  Location: [facility];  Service: Orthopedics;  Laterality: Right;  . TRIGGER FINGER RELEASE Right     [place]    Prior to Admission medications   Medication Sig Start Date End Date Taking? Authorizing Provider  ALPRAZolam Duanne Moron) 1 MG tablet TAKE 1 TABLET BY MOUTH ONCE OR TWICE A DAY FOR ANXIETY 12/09/18   [name], MD  amLODipine (NORVASC) 10 MG tablet Take 1 tablet (10 mg total) by mouth daily. 01/23/19 02/22/19  Wyvonnia Dusky, MD  aspirin EC 81 MG EC tablet Take 1 tablet (81 mg total) by mouth daily for 30 doses. 01/23/19 02/22/19  Wyvonnia Dusky, MD  buPROPion [facility]  XL) 150 MG 24 hr tablet TAKE 1 TABLET BY MOUTH EVERY DAY Patient taking differently: Take 150 mg by mouth daily.  09/27/18   [name], MD  carvedilol (COREG) 12.5 MG tablet Take 1 tablet (12.5 mg total) by mouth 2 (two) times daily with a meal. 01/23/19 02/22/19  Wyvonnia Dusky, MD  clopidogrel (PLAVIX) 75 MG tablet Take 1 tablet (75 mg total) by mouth daily. 07/08/18   Theora Gianotti, NP  diphenoxylate-atropine (LOMOTIL) 2.5-0.025 MG tablet Take one capsule 1-4 times daily as needed for diarrhea Patient taking differently: Take 1-4 tablets by mouth daily. Take one capsule 1-4 times daily for diarrhea 01/10/19   [name], MD  ferrous sulfate 325 (65 FE) MG tablet Take 1 tablet (325 mg total) by mouth daily with breakfast. 01/23/19 02/22/19  Wyvonnia Dusky, MD  furosemide (LASIX) 20 MG tablet Take 1 tablet (20 mg total) by mouth daily. 01/23/19 02/22/19  Wyvonnia Dusky, MD  gabapentin (NEURONTIN) 300 MG capsule Take 1 capsule (300 mg total) by mouth 2 (two) times daily. 10/12/18   [name], MD  Galcanezumab-gnlm (EMGALITY) 120 MG/ML SOAJ INJECT 240 MG SUBCUTANEOUSLY AS DIRECTED FOR THE FIRST  MONTH. 11/11/18   [provider]  isosorbide mononitrate (IMDUR) 60 MG 24 hr tablet Take 1 tablet (60 mg total) by mouth daily. 01/23/19 02/22/19  Wyvonnia Dusky, MD  levothyroxine (SYNTHROID, LEVOTHROID) 25 MCG tablet Take 1 tablet (25 mcg total) by mouth daily before breakfast. 03/28/18   [name], MD  losartan (COZAAR) 50 MG tablet Take 1 tablet (50 mg total) by mouth daily. 01/23/19 02/22/19  Wyvonnia Dusky, MD  metFORMIN (GLUCOPHAGE-XR) 500 MG 24 hr tablet TAKE 1 TABLET BY MOUTH ONCE DAILY WITH DINNER 11/02/18   [provider]  nitroGLYCERIN (NITROSTAT) 0.4 MG SL tablet Place 1 tablet (0.4 mg total) under the tongue every 5 (five) minutes as needed for chest pain. 06/02/17   Minna Merritts, MD  pantoprazole (PROTONIX) 40 MG tablet Take 1 tablet (40 mg total) by mouth 2 (two) times daily. 03/02/18   [name], MD  potassium chloride (KLOR-CON) 10 MEQ tablet Take 1 tablet (10 mEq total) by mouth daily. 01/23/19 02/22/19  Wyvonnia Dusky, MD  promethazine (PHENERGAN) 25 MG tablet Take 1 tablet (25 mg total) by mouth every 6 (six) hours as needed for nausea or vomiting. 11/07/18   [name], MD  rosuvastatin (CRESTOR) 40 MG tablet Take 1 tablet (40 mg total) by mouth daily. 10/12/18   [name], MD  venlafaxine XR (EFFEXOR-XR) 75 MG 24 hr capsule Take 1 capsule (75 mg total) by mouth daily with breakfast. 09/27/18   [name], MD    Allergies Lipitor [atorvastatin], Tramadol, and Lactose intolerance (gi)  Family History  Problem Relation Age of Onset  . Hypertension Mother   . Anxiety disorder Mother   . Depression Mother   . Bipolar disorder Mother   . Heart disease Mother        No details  . Hyperlipidemia Mother   . Kidney disease Father   . Heart disease Father 11  . Hypertension Father   . Diabetes Father   . Stroke Father   . Colon cancer Father        dx in his 63's  . Anxiety disorder Father   . Depression Father    . Skin cancer Father   . Kidney disease Sister   .  Thyroid nodules Sister   . Hypertension Sister   . Hypertension Sister   . Diabetes Sister   . Hyperlipidemia Sister   . Depression Sister   . Breast cancer Maternal Aunt 83  . Breast cancer Maternal Aunt 10  . Ovarian cancer Cousin   . Colon cancer Cousin   . Breast cancer Other   . Kidney cancer Neg Hx   . Bladder Cancer Neg Hx     Social History Social History   Tobacco Use  . Smoking status: Former Smoker    Types: Cigarettes    Quit date: 08/31/1994    Years since quitting: 24.4  . Smokeless tobacco: Never Used  . Tobacco comment: quit 28 years ago  Substance Use Topics  . Alcohol use: No    Alcohol/week: 0.0 standard drinks  . Drug use: No      Review of Systems Constitutional: No fever/chills Eyes: No visual changes. ENT: No sore throat. Cardiovascular: Positive chest pain Respiratory: Positive shortness of breath Gastrointestinal: No abdominal pain.  No nausea, no vomiting.  No diarrhea.  No constipation. Genitourinary: Negative for dysuria. Musculoskeletal: Negative for back pain. Skin: Negative for rash. Neurological: Negative for headaches, focal weakness or numbness. All other ROS negative ____________________________________________   PHYSICAL EXAM:  VITAL SIGNS: ED Triage Vitals  Enc Vitals Group     BP --      Pulse Rate 01/27/19 2013 76     Resp 01/27/19 2013 20     Temp 01/27/19 2013 97.8 F (36.6 C)     Temp Source 01/27/19 2013 Oral     SpO2 01/27/19 2013 98 %     Weight 01/27/19 2015 152 lb (68.9 kg)     Height 01/27/19 2015 _0  (1.575 m)     Head Circumference --      Peak Flow --      Pain Score 01/27/19 2014 5     Pain Loc --      Pain Edu? --      Excl. in [place]? --     Constitutional: Alert and oriented. Well appearing and in no acute distress. Eyes: Conjunctivae are normal. EOMI. Head: Atraumatic. Nose: No congestion/rhinnorhea. Mouth/Throat: Mucous membranes are  moist.   Neck: No stridor. Trachea Midline. FROM Cardiovascular: Normal rate, regular rhythm. Grossly normal heart sounds.  Good peripheral circulation. Respiratory: Normal respiratory effort.  No retractions. Lungs CTAB. Gastrointestinal: Soft and nontender. No distention. No abdominal bruits.  Musculoskeletal: No lower extremity tenderness nor edema.  No joint effusions. Neurologic:  Normal speech and language. No gross focal neurologic deficits are appreciated.  Skin:  Skin is warm, dry and intact. No rash noted. Psychiatric: Mood and affect are normal. Speech and behavior are normal. GU: Deferred   ____________________________________________   LABS (all labs ordered are listed, but only abnormal results are displayed)  Labs Reviewed  CBC WITH DIFFERENTIAL/PLATELET - Abnormal; Notable for the following components:      Result Value   WBC 11.0 (*)    Hemoglobin 8.8 (*)    HCT 28.0 (*)    MCV 66.0 (*)    MCH 20.8 (*)    RDW 16.5 (*)    All other components within normal limits  COMPREHENSIVE METABOLIC PANEL - Abnormal; Notable for the following components:   Sodium 131 (*)    Potassium 3.3 (*)    CO2 21 (*)    BUN 29 (*)    Creatinine, Ser 2.09 (*)    Calcium 7.9 (*)  Albumin 3.2 (*)    GFR calc non Af Amer 27 (*)    GFR calc Af Amer 31 (*)    All other components within normal limits  LIPASE, BLOOD  BRAIN NATRIURETIC PEPTIDE  PROCALCITONIN  PROTIME-INR  APTT  TROPONIN I (HIGH SENSITIVITY)  TROPONIN I (HIGH SENSITIVITY)   ____________________________________________   ED ECG REPORT I, Vanessa Austin, the attending physician, personally viewed and interpreted this ECG.  EKG is normal sinus rate of 78, no ST elevation, T wave inversion in V5 and V6 with occasional PVCs.  These T wave inversions are similar to prior. ____________________________________________  [place], personally viewed and evaluated these images (plain radiographs) as part  of my medical decision making, as well as reviewing the written report by the radiologist.  ED MD interpretation:  No focal infiltrate   Official radiology report(s): DG Chest 2 View  Result Date: 01/27/2019 CLINICAL DATA:  48 year old female with chest pain. EXAM: CHEST - 2 VIEW COMPARISON:  Chest radiograph dated 01/19/2019. FINDINGS: Shallow inspiration. Bilateral perihilar streaky densities, likely atelectasis. Developing infiltrate is less likely. Clinical correlation is recommended. No focal consolidation, pleural effusion, or pneumothorax. Stable cardiac silhouette. Surgical clips noted in the upper mediastinum. No acute osseous pathology. There is air distention the colon. IMPRESSION: 1. No focal consolidation. 2. Bilateral perihilar streaky densities, likely atelectasis. Developing infiltrate is less likely. Electronically Signed   By: Anner Crete M.D.   On: 01/27/2019 21:17    ____________________________________________   PROCEDURES  Procedure(s) performed (including Critical Care):  Procedures   ____________________________________________   INITIAL IMPRESSION / ASSESSMENT AND PLAN / ED COURSE   [name] was evaluated in Emergency Department on 01/27/2019 for the symptoms described in the history of present illness. She was evaluated in the context of the global COVID-19 pandemic, which necessitated consideration that the patient might be at risk for infection with the SARS-CoV-2 virus that causes COVID-19. Institutional protocols and algorithms that pertain to the evaluation of patients at risk for COVID-19 are in a state of rapid change based on information released by regulatory bodies including the CDC and federal and state organizations. These policies and algorithms were followed during the patient's care in the ED.    Most Likely DDx:  -MSK (atypical chest pain) vs unstable angina vs ACS   DDx that was also considered d/t potential to cause harm,  but was found less likely based on history and physical (as detailed above): -PNA (no fevers, cough but CXR to evaluate) -PNX (reassured with equal b/l breath sounds, CXR to evaluate) -Symptomatic anemia (will get H&H) -Pulmonary embolism as no sob at rest, not pleuritic in nature, no hypoxia, PE scan last time was negative. -Aortic Dissection as no tearing pain and no radiation to the mid back, pulses equal -Pericarditis no rub on exam, EKG changes or hx to suggest dx -Tamponade (no notable SOB, tachycardic, hypotensive) -Esophageal rupture (no h/o diffuse vomitting/no crepitus)   Possible infiltrate on xray but procalc was negative so less likely.  Cr elevated given 1L fluids.   Hemoglobin stable at baseline.   Cardiac markers are negative.  Patient continues to have active chest pain.  Difficult situation given she is had prior active chest pain and required angioplasty in the past.  Discussed with the hospitalist who wanted to talk with the cardiology team.  I did discuss with Dr. Fletcher Anon who do not recommend heparin or catheterization at this time.  Can just continue  to trend the cardiac markers with the hospitalist.    ____________________________________________   FINAL CLINICAL IMPRESSION(S) / ED DIAGNOSES   Final diagnoses:  Chest pain, unspecified type     MEDICATIONS GIVEN DURING THIS VISIT:  Medications  sodium chloride 0.9 % bolus 1,000 mL (has no administration in time range)  morphine 4 MG/ML injection 4 mg (4 mg Intravenous Given 01/27/19 2117)     ED Discharge Orders    None       Note:  This document was prepared using Dragon voice recognition software and may include unintentional dictation errors.   Vanessa Braselton, MD 01/27/19 2300

## 2019-01-28 ENCOUNTER — Inpatient Hospital Stay (HOSPITAL_BASED_OUTPATIENT_CLINIC_OR_DEPARTMENT_OTHER)
Admit: 2019-01-28 | Discharge: 2019-01-28 | Disposition: A | Discharge status: Home / Self Care | Payer: Medicare Other | Attending: Cardiovascular Disease | Admitting: Cardiovascular Disease

## 2019-01-28 ENCOUNTER — Other Ambulatory Visit: Payer: Self-pay

## 2019-01-28 DIAGNOSIS — N183 Chronic kidney disease, stage 3 unspecified: Secondary | ICD-10-CM | POA: Diagnosis not present

## 2019-01-28 DIAGNOSIS — R0602 Shortness of breath: Secondary | ICD-10-CM

## 2019-01-28 DIAGNOSIS — D508 Other iron deficiency anemias: Secondary | ICD-10-CM

## 2019-01-28 DIAGNOSIS — N1831 Chronic kidney disease, stage 3a: Secondary | ICD-10-CM | POA: Diagnosis not present

## 2019-01-28 DIAGNOSIS — I2510 Atherosclerotic heart disease of native coronary artery without angina pectoris: Secondary | ICD-10-CM

## 2019-01-28 DIAGNOSIS — Z20828 Contact with and (suspected) exposure to other viral communicable diseases: Secondary | ICD-10-CM | POA: Diagnosis not present

## 2019-01-28 DIAGNOSIS — E1121 Type 2 diabetes mellitus with diabetic nephropathy: Secondary | ICD-10-CM | POA: Diagnosis not present

## 2019-01-28 DIAGNOSIS — I25118 Atherosclerotic heart disease of native coronary artery with other forms of angina pectoris: Secondary | ICD-10-CM

## 2019-01-28 DIAGNOSIS — I25119 Atherosclerotic heart disease of native coronary artery with unspecified angina pectoris: Secondary | ICD-10-CM | POA: Diagnosis not present

## 2019-01-28 DIAGNOSIS — R079 Chest pain, unspecified: Secondary | ICD-10-CM | POA: Diagnosis not present

## 2019-01-28 LAB — GLUCOSE, CAPILLARY
Glucose-Capillary: 55 mg/dL — ABNORMAL LOW (ref 70–99)
Glucose-Capillary: 82 mg/dL (ref 70–99)

## 2019-01-28 LAB — CBC
HCT: 26.9 % — ABNORMAL LOW (ref 36.0–46.0)
HCT: 27.4 % — ABNORMAL LOW (ref 36.0–46.0)
Hemoglobin: 8.4 g/dL — ABNORMAL LOW (ref 12.0–15.0)
Hemoglobin: 8.8 g/dL — ABNORMAL LOW (ref 12.0–15.0)
MCH: 20.6 pg — ABNORMAL LOW (ref 26.0–34.0)
MCH: 21 pg — ABNORMAL LOW (ref 26.0–34.0)
MCHC: 31.2 g/dL (ref 30.0–36.0)
MCHC: 32.1 g/dL (ref 30.0–36.0)
MCV: 65.2 fL — ABNORMAL LOW (ref 80.0–100.0)
MCV: 66.1 fL — ABNORMAL LOW (ref 80.0–100.0)
Platelets: 134 10*3/uL — ABNORMAL LOW (ref 150–400)
Platelets: 170 10*3/uL (ref 150–400)
RBC: 4.07 MIL/uL (ref 3.87–5.11)
RBC: 4.2 MIL/uL (ref 3.87–5.11)
RDW: 16.4 % — ABNORMAL HIGH (ref 11.5–15.5)
RDW: 16.4 % — ABNORMAL HIGH (ref 11.5–15.5)
WBC: 10.8 10*3/uL — ABNORMAL HIGH (ref 4.0–10.5)
WBC: 11.2 10*3/uL — ABNORMAL HIGH (ref 4.0–10.5)
nRBC: 0 % (ref 0.0–0.2)
nRBC: 0 % (ref 0.0–0.2)

## 2019-01-28 LAB — LIPID PANEL
Cholesterol: 106 mg/dL (ref 0–200)
HDL: 56 mg/dL (ref >40)
LDL Cholesterol: 40 mg/dL (ref 0–99)
Total CHOL/HDL Ratio: 1.9 RATIO
Triglycerides: 52 mg/dL (ref <150)
VLDL: 10 mg/dL (ref 0–40)

## 2019-01-28 LAB — ECHOCARDIOGRAM COMPLETE
Height: 62 in
Weight: 2432 oz

## 2019-01-28 LAB — RESPIRATORY PANEL BY RT PCR (FLU A&B, COVID)
Influenza A by PCR: NEGATIVE
Influenza B by PCR: NEGATIVE
SARS Coronavirus 2 by RT PCR: NEGATIVE

## 2019-01-28 LAB — TROPONIN I (HIGH SENSITIVITY)
Troponin I (High Sensitivity): 15 ng/L
Troponin I (High Sensitivity): 12 ng/L (ref <18)

## 2019-01-28 LAB — PROTIME-INR
INR: 0.9 (ref 0.8–1.2)
Prothrombin Time: 12.5 s (ref 11.4–15.2)

## 2019-01-28 LAB — BASIC METABOLIC PANEL
Anion gap: 10 (ref 5–15)
BUN: 25 mg/dL — ABNORMAL HIGH (ref 6–20)
CO2: 20 mmol/L — ABNORMAL LOW (ref 22–32)
Calcium: 8 mg/dL — ABNORMAL LOW (ref 8.9–10.3)
Chloride: 103 mmol/L (ref 98–111)
Creatinine, Ser: 1.89 mg/dL — ABNORMAL HIGH (ref 0.44–1.00)
GFR calc Af Amer: 35 mL/min — ABNORMAL LOW (ref >60)
GFR calc non Af Amer: 30 mL/min — ABNORMAL LOW (ref >60)
Glucose, Bld: 75 mg/dL (ref 70–99)
Potassium: 3.6 mmol/L (ref 3.5–5.1)
Sodium: 133 mmol/L — ABNORMAL LOW (ref 135–145)

## 2019-01-28 LAB — APTT: aPTT: 30 s (ref 24–36)

## 2019-01-28 LAB — HEMOGLOBIN A1C
Hgb A1c MFr Bld: 5.6 % (ref 4.8–5.6)
Mean Plasma Glucose: 114.02 mg/dL

## 2019-01-28 MED ORDER — FUROSEMIDE 20 MG PO TABS: 20.0000 mg | ORAL_TABLET | ORAL | 1 refills | Status: DC

## 2019-01-28 MED ORDER — POTASSIUM CHLORIDE ER 10 MEQ PO TBCR: 20.0000 meq | EXTENDED_RELEASE_TABLET | ORAL | 0 refills | Status: DC

## 2019-01-28 MED ORDER — MORPHINE SULFATE (PF) 2 MG/ML IV SOLN: 2.0000 mg | INTRAVENOUS | Status: DC | PRN

## 2019-01-28 MED ORDER — SODIUM CHLORIDE 0.9 % IV SOLN
200.0000 mg | Freq: Once | INTRAVENOUS | Status: AC
  Administered 2019-01-28: 200 mg via INTRAVENOUS
  Filled 2019-01-28: qty 10

## 2019-01-28 NOTE — ED Notes (Signed)
Per admitting MD, Pt may be discharged if echo results unchanged/negative. Agricultural consultant notified.

## 2019-01-28 NOTE — ED Notes (Signed)
Pt ambulatory to toilet with steady gait.  

## 2019-01-28 NOTE — ED Notes (Signed)
Admission MD at bedside- states pt may be able to be discharged after iron and echocardiogram are complete. Pt provided meal tray at this time.

## 2019-01-28 NOTE — ED Notes (Signed)
Echocardiogram tech at bedside at this time.

## 2019-01-28 NOTE — ED Notes (Signed)
Pt ambulatory to toilet with steady gait noted.  

## 2019-01-28 NOTE — ED Notes (Signed)
Pt provided orange juice to take with morning meds d/t CBG being 53 previously. Pt requesting medication for diarrhea.

## 2019-01-28 NOTE — Progress Notes (Signed)
*  PRELIMINARY RESULTS* Echocardiogram 2D Echocardiogram has been performed.  [name] 01/28/2019, 1:48 PM

## 2019-01-28 NOTE — ED Notes (Signed)
Cardiology at bedside.

## 2019-01-28 NOTE — Discharge Summary (Signed)
Physician Discharge Summary  [place] JSH:[id number] DOB: February 26, 1970 DOA: 01/27/2019  PCP: [name], MD  Admit date: 01/27/2019 Discharge date: 01/28/2019  Admitted From: Home Disposition: Home  Recommendations for Outpatient Follow-up:  1. Follow up with PCP in 1-2 weeks 2. Please obtain BMP/CBC in one week 3. Please follow up with hematology in 1 to 2 weeks for possible iron infusions 4. Please follow-up with cardiology, Dr. [name] in 1 to 2 weeks  Home Health: No Equipment/Devices: None  Discharge Condition: Stable CODE STATUS: Full Diet recommendation: Heart healthy  Brief/Interim Summary:   48 y.o. female with medical history of coronary artery disease s/p stent in LAD, last cath 09/2017 with ballon angioplasty of ostial second diagonal without stent placement, CKD 3 type 2 diabetes mellitus, hypertension, hypothyroidism, dyslipidemia and bipolar disorder,  systolic CHF.  She presented to the ED with sudden onset of left-sided jaw pain radiating to her mid chest while at rest.  Of note patient was hospitalized from 12/17-12/21 for chest pain evaluation which showed mildly elevated troponins which trended flat andwas thought secondary to demand ischemia in the setting of hypertensive emergency.  She was discharged with close cardiology follow-up at that time.  Echo that admission showed EF of 65% up from 45 to 50% previously.  During prior admission, she had CTA chest that was negative for PE.  In the ED, mildly hypertensive, otherwise vitals stable.  Labs notable for BNP of 40 which was trending down from prior admission.  CBC showed stable anemia with hemoglobin 8.8.  EKG showed no acute [name]-T wave changes.  She was treated in the ED with improvement in symptoms and admitted to hospitalist service for further evaluation.  ED physician had spoken with cardiologist on-call who recommended admission for trending of cardiac enzymes.  Troponins remain negative x3 and  patient remained free of chest pain for the duration of admission.  Echo was repeated and unchanged, EF 60 to 65% with grade 1 diastolic dysfunction.  No new wall motion abnormalities to suggest a recent ischemic event.  Lasix had been previously added given chronic shortness of breath and known ischemic cardiomyopathy, however this did not improve her shortness of breath.  Cardiology felt patient was on the dry side with low pressures seen on echo, recommended reducing Lasix from daily to 3 times weekly.  She received IV iron transfusion during admission, as her current shortness of breath could be due to iron deficiency anemia.  Recommended she return to hematology where she has been followed in the past for iron deficiency anemia.  Patient is clinically improved and stable for discharge home today with close PCP, cardiology and hematology follow-ups.   Discharge Diagnoses: Principal Problem:   Recurrent chest pain Active Problems:   Type 2 diabetes mellitus with stage 3 chronic kidney disease, without long-term current use of insulin (HCC)   CKD (chronic kidney disease), stage III (HCC)   CAD in native artery   Chest pain    Discharge Instructions   Discharge Instructions    Call MD for:  extreme fatigue   Complete by: As directed    Call MD for:  temperature >100.4   Complete by: As directed    Diet - low sodium heart healthy   Complete by: As directed    Discharge instructions   Complete by: As directed    Take your Lasix every other day instead of every day. Follow up with Dr. Grayland Ormond hematology for possible iron infusion.  Increase activity slowly   Complete by: As directed    Increase activity slowly   Complete by: As directed      Allergies as of 01/28/2019      Reactions   Lipitor [atorvastatin] Other (See Comments)   Leg pains   Tramadol Other (See Comments)   Mouth feels like it's on fire   Lactose Intolerance (gi) Nausea Only      Medication List    STOP  taking these medications   metFORMIN 500 MG 24 hr tablet Commonly known as: GLUCOPHAGE-XR   potassium chloride 10 MEQ tablet Commonly known as: KLOR-CON     TAKE these medications   ALPRAZolam 1 MG tablet Commonly known as: XANAX TAKE 1 TABLET BY MOUTH ONCE OR TWICE A DAY FOR ANXIETY   amLODipine 10 MG tablet Commonly known as: NORVASC Take 1 tablet (10 mg total) by mouth daily.   aspirin 81 MG EC tablet Take 1 tablet (81 mg total) by mouth daily for 30 doses.   buPROPion 150 MG 24 hr tablet Commonly known as: WELLBUTRIN XL TAKE 1 TABLET BY MOUTH EVERY DAY   carvedilol 12.5 MG tablet Commonly known as: COREG Take 1 tablet (12.5 mg total) by mouth 2 (two) times daily with a meal.   clopidogrel 75 MG tablet Commonly known as: PLAVIX Take 1 tablet (75 mg total) by mouth daily.   diphenoxylate-atropine 2.5-0.025 MG tablet Commonly known as: LOMOTIL Take one capsule 1-4 times daily as needed for diarrhea What changed:   how much to take  how to take this  when to take this  additional instructions   Emgality 120 MG/ML Soaj Generic drug: Galcanezumab-gnlm INJECT 240 MG SUBCUTANEOUSLY AS DIRECTED FOR THE FIRST MONTH.   ferrous sulfate 325 (65 FE) MG tablet Take 1 tablet (325 mg total) by mouth daily with breakfast.   furosemide 20 MG tablet Commonly known as: LASIX Take 1 tablet (20 mg total) by mouth 3 (three) times a week. On Monday, Wednesday and Friday Start taking on: January 30, 2019 What changed:   when to take this  additional instructions   gabapentin 300 MG capsule Commonly known as: NEURONTIN Take 1 capsule (300 mg total) by mouth 2 (two) times daily.   isosorbide mononitrate 60 MG 24 hr tablet Commonly known as: IMDUR Take 1 tablet (60 mg total) by mouth daily.   levothyroxine 25 MCG tablet Commonly known as: SYNTHROID Take 1 tablet (25 mcg total) by mouth daily before breakfast.   losartan 50 MG tablet Commonly known as: COZAAR Take 1  tablet (50 mg total) by mouth daily.   nitroGLYCERIN 0.4 MG SL tablet Commonly known as: NITROSTAT Place 1 tablet (0.4 mg total) under the tongue every 5 (five) minutes as needed for chest pain.   pantoprazole 40 MG tablet Commonly known as: PROTONIX Take 1 tablet (40 mg total) by mouth 2 (two) times daily.   potassium chloride 10 MEQ tablet Commonly known as: KLOR-CON Take 2 tablets (20 mEq total) by mouth 3 (three) times a week. On Monday, Wednesday, Friday with Lasix Start taking on: January 30, 2019   promethazine 25 MG tablet Commonly known as: PHENERGAN Take 1 tablet (25 mg total) by mouth every 6 (six) hours as needed for nausea or vomiting.   rosuvastatin 40 MG tablet Commonly known as: CRESTOR Take 1 tablet (40 mg total) by mouth daily.   venlafaxine XR 75 MG 24 hr capsule Commonly known as: EFFEXOR-XR Take 1 capsule (75 mg total)  by mouth daily with breakfast.      Follow-up Information    Lloyd Huger, MD. Schedule an appointment as soon as possible for a visit in 2 week(s).   Specialty: Oncology Contact information: [place] [address] [phone]        [name], MD. Schedule an appointment as soon as possible for a visit in 1 week(s).   Specialty: Family Medicine Contact information: [address] [place] [address] [phone]        Minna Merritts, MD .   Specialty: Cardiology Contact information: [address] [place] [address] [phone]          Allergies  Allergen Reactions  . Lipitor [Atorvastatin] Other (See Comments)    Leg pains  . Tramadol Other (See Comments)    Mouth feels like it's on fire  . Lactose Intolerance (Gi) Nausea Only    Consultations:  Cardiology   Procedures/Studies: DG Chest 2 View  Result Date: 01/27/2019 CLINICAL DATA:  48 year old female with chest pain. EXAM: CHEST - 2 VIEW COMPARISON:  Chest radiograph dated  01/19/2019. FINDINGS: Shallow inspiration. Bilateral perihilar streaky densities, likely atelectasis. Developing infiltrate is less likely. Clinical correlation is recommended. No focal consolidation, pleural effusion, or pneumothorax. Stable cardiac silhouette. Surgical clips noted in the upper mediastinum. No acute osseous pathology. There is air distention the colon. IMPRESSION: 1. No focal consolidation. 2. Bilateral perihilar streaky densities, likely atelectasis. Developing infiltrate is less likely. Electronically Signed   By: Anner Crete M.D.   On: 01/27/2019 21:17   DG Chest 2 View  Result Date: 01/19/2019 CLINICAL DATA:  Pt c/o CP since Tuesday that is pressure like in nature and some SOB. Pt states called her MD and was advised to come to the ED. Hx of CHF, stage 3 kidney failure. EXAM: CHEST - 2 VIEW COMPARISON:  10/12/2018 FINDINGS: Cardiac silhouette is normal in size. No mediastinal or hilar masses. No evidence of adenopathy. Clear lungs.  No pleural effusion or pneumothorax. Skeletal structures are intact. IMPRESSION: No active cardiopulmonary disease. Electronically Signed   By: Lajean Manes M.D.   On: 01/19/2019 15:50   CT Angio Chest PE W and/or Wo Contrast  Result Date: 01/19/2019 CLINICAL DATA:  Shortness of breath EXAM: CT ANGIOGRAPHY CHEST WITH CONTRAST TECHNIQUE: Multidetector CT imaging of the chest was performed using the standard protocol during bolus administration of intravenous contrast. Multiplanar CT image reconstructions and MIPs were obtained to evaluate the vascular anatomy. CONTRAST:  31mL OMNIPAQUE IOHEXOL 350 MG/ML SOLN COMPARISON:  05/31/2015 FINDINGS: Cardiovascular: Contrast injection is sufficient to demonstrate satisfactory opacification of the pulmonary arteries to the segmental level. There is no pulmonary embolus. The main pulmonary artery is within normal limits for size. There is no CT evidence of acute right heart strain. There are mild atherosclerotic  changes of the visualized thoracic aorta. 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 There is a bypass graft from the left common carotid artery to the left subclavian artery. The visualized portions of the left subclavian artery are grossly patent beyond the level of the anastomosis. The appearance of this finding is similar to prior CT. Heart size is mildly enlarged. Coronary artery calcifications are noted. There is no significant pericardial effusion. Mediastinum/Nodes: --No mediastinal or hilar lymphadenopathy. --No axillary lymphadenopathy. --No supraclavicular lymphadenopathy. --the left hemi thyroid appears to be either surgically absent or atretic. --The esophagus  is unremarkable Lungs/Pleura: No pulmonary nodules or masses. No pleural effusion or pneumothorax. No focal airspace consolidation. No focal pleural abnormality. Upper Abdomen: The patient appears to be status post prior gastric bypass. The patient is status post prior cholecystectomy. The upper abdomen is otherwise unremarkable. Musculoskeletal: No chest wall abnormality. No acute or significant osseous findings. Review of the MIP images confirms the above findings. IMPRESSION: 1. No evidence for pulmonary embolus or other acute intrathoracic process. 2. 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 There is a bypass graft from the left common carotid artery to the left subclavian artery. The visualized portions of the left subclavian artery are grossly patent beyond the level of the anastomosis. Overall, this is stable since 2017. Aortic Atherosclerosis (ICD10-I70.0). Electronically Signed   By: Constance Holster M.D.   On: 01/19/2019 19:13   ECHOCARDIOGRAM COMPLETE  Result Date: 01/28/2019   ECHOCARDIOGRAM REPORT   Patient Name:   [name] [facility] Date of Exam: 01/28/2019 Medical Rec #:  [id number]             Height:       62.0 in  Accession #:    [id number]            Weight:       152.0 lb Date of Birth:  May 31, 1970             BSA:          1.70 m Patient Age:    48 years              BP:           135/80 mmHg Patient Gender: F                     HR:           85 bpm. Exam Location:  [facility] Procedure: 2D Echo, Color Doppler and Cardiac Doppler Indications:     R06.00 Dyspnea  History:         Patient has prior history of Echocardiogram examinations, most                  recent 02/18/2018. CAD, Stroke and CKD; Risk                  Factors:Hypertension, Dyslipidemia and Diabetes.  Sonographer:     Charmayne Sheer RDCS (AE) Referring Phys:  [place] Phys: Ida Rogue MD IMPRESSIONS  1. Left ventricular ejection fraction, by visual estimation, is 60 to 65%. The left ventricle has normal function. There is no left ventricular hypertrophy.  2. Left ventricular diastolic parameters are consistent with Grade I diastolic dysfunction (impaired relaxation).  3. The left ventricle has no regional wall motion abnormalities.  4. Global right ventricle has normal systolic function.The right ventricular size is normal. No increase in right ventricular wall thickness.  5. Left atrial size was normal.  6. TR signal is inadequate for assessing pulmonary artery systolic pressure. FINDINGS  Left Ventricle: Left ventricular ejection fraction, by visual estimation, is 60 to 65%. The left ventricle has normal function. The left ventricle has no regional wall motion abnormalities. There is no left ventricular hypertrophy. Left ventricular diastolic parameters are consistent with Grade I diastolic dysfunction (impaired relaxation). Normal left atrial pressure. Right Ventricle: The right ventricular size is normal. No increase in right ventricular wall thickness. Global RV systolic function is has normal systolic function. Left Atrium:  Left atrial size was normal in size. Right Atrium: Right atrial size was normal in size Pericardium: There is  no evidence of pericardial effusion. Mitral Valve: The mitral valve is normal in structure. No evidence of mitral valve regurgitation. No evidence of mitral valve stenosis by observation. MV peak gradient, 5.5 mmHg. Tricuspid Valve: The tricuspid valve is normal in structure. Tricuspid valve regurgitation is not demonstrated. Aortic Valve: The aortic valve is normal in structure. Aortic valve regurgitation is not visualized. The aortic valve is structurally normal, with no evidence of sclerosis or stenosis. Aortic valve mean gradient measures 9.0 mmHg. Aortic valve peak gradient measures 16.6 mmHg. Aortic valve area, by VTI measures 1.35 cm. Pulmonic Valve: The pulmonic valve was normal in structure. Pulmonic valve regurgitation is not visualized. Pulmonic regurgitation is not visualized. Aorta: The aortic root, ascending aorta and aortic arch are all structurally normal, with no evidence of dilitation or obstruction. Venous: The inferior vena cava is normal in size with greater than 50% respiratory variability, suggesting right atrial pressure of 3 mmHg. IAS/Shunts: No atrial level shunt detected by color flow Doppler. There is no evidence of a patent foramen ovale. No ventricular septal defect is seen or detected. There is no evidence of an atrial septal defect.  LEFT VENTRICLE PLAX 2D LVIDd:         5.11 cm  Diastology LVIDs:         3.24 cm  LV e' lateral:   14.10 cm/s LV PW:         0.92 cm  LV E/e' lateral: 6.1 LV IVS:        0.75 cm  LV e' medial:    8.05 cm/s LVOT diam:     1.90 cm  LV E/e' medial:  10.7 LV SV:         82 ml LV SV Index:   46.76 LVOT Area:     2.84 cm  RIGHT VENTRICLE RV Basal diam:  2.75 cm LEFT ATRIUM             Index       RIGHT ATRIUM           Index LA diam:        3.80 cm 2.23 cm/m  RA Area:     12.40 cm LA Vol (A2C):   27.6 ml 16.22 ml/m RA Volume:   27.20 ml  15.99 ml/m LA Vol (A4C):   42.8 ml 25.16 ml/m LA Biplane Vol: 34.4 ml 20.22 ml/m  AORTIC VALVE                     PULMONIC VALVE AV Area (Vmax):    1.29 cm     PV Vmax:       1.41 m/s AV Area (Vmean):   1.33 cm     PV Vmean:      98.700 cm/s AV Area (VTI):     1.35 cm     PV VTI:        0.267 m AV Vmax:           204.00 cm/s  PV Peak grad:  8.0 mmHg AV Vmean:          145.000 cm/s PV Mean grad:  4.0 mmHg AV VTI:            0.335 m AV Peak Grad:      16.6 mmHg AV Mean Grad:      9.0 mmHg LVOT Vmax:  93.10 cm/s LVOT Vmean:        68.100 cm/s LVOT VTI:          0.160 m LVOT/AV VTI ratio: 0.48  AORTA Ao Root diam: 3.00 cm MITRAL VALVE MV Area (PHT): 4.60 cm              SHUNTS MV Peak grad:  5.5 mmHg              Systemic VTI:  0.16 m MV Mean grad:  3.0 mmHg              Systemic Diam: 1.90 cm MV Vmax:       1.17 m/s MV Vmean:      90.3 cm/s MV VTI:        0.24 m MV PHT:        47.85 msec MV Decel Time: 165 msec MV E velocity: 85.90 cm/s  103 cm/s MV A velocity: 104.00 cm/s 70.3 cm/s MV E/A ratio:  0.83        1.5  Ida Rogue MD Electronically signed by Ida Rogue MD Signature Date/Time: 01/28/2019/3:48:37 PM    Final    XR FEMUR MIN 2 VIEWS LEFT  Result Date: 01/10/2019 Healed intertrochanteric fracture     Echo 12/26 -EF 60 to 65% with grade 1 diastolic dysfunction   Subjective: Patient seen and examined in the ED awaiting admission.  She reported resolution of chest pain and had not had any since much earlier this morning.  No shortness of breath at rest.  No fevers or chills, nausea vomiting or diarrhea, denies any acute complaints.  No acute events reported.   Discharge Exam: Vitals:   01/28/19 1400 01/28/19 1546  BP:  136/76  Pulse: 80   Resp: 16 18  Temp:  97.7 F (36.5 C)  SpO2: 99% 98%   Vitals:   01/28/19 1300 01/28/19 1330 01/28/19 1400 01/28/19 1546  BP:    136/76  Pulse: 72 92 80   Resp: 11 15 16 18   Temp:    97.7 F (36.5 C)  TempSrc:    Oral  SpO2: 99% 99% 99% 98%  Weight:      Height:        General: Pt is alert, awake, not in acute distress Cardiovascular:  RRR, S1/S2 +, no rubs, no gallops Respiratory: CTA bilaterally, no wheezing, no rhonchi Abdominal: Soft, NT, ND, bowel sounds + Extremities: no edema, no cyanosis    The results of significant diagnostics from this hospitalization (including imaging, microbiology, ancillary and laboratory) are listed below for reference.     Microbiology: Recent Results (from the past 240 hour(s))  Respiratory Panel by RT PCR (Flu A&B, Covid) - Nasopharyngeal Swab     Status: None   Collection Time: 01/19/19  8:36 PM   Specimen: Nasopharyngeal Swab  Result Value Ref Range Status   SARS Coronavirus 2 by RT PCR NEGATIVE NEGATIVE Final    Comment: (NOTE) SARS-CoV-2 target nucleic acids are NOT DETECTED. The SARS-CoV-2 RNA is generally detectable in upper respiratoy specimens during the acute phase of infection. The lowest concentration of SARS-CoV-2 viral copies this assay can detect is 131 copies/mL. A negative result does not preclude SARS-Cov-2 infection and should not be used as the sole basis for treatment or other patient management decisions. A negative result may occur with  improper specimen collection/handling, submission of specimen other than nasopharyngeal swab, presence of viral mutation(s) within the areas targeted by this assay, and inadequate number of  viral copies (<131 copies/mL). A negative result must be combined with clinical observations, patient history, and epidemiological information. The expected result is Negative. Fact Sheet for Patients:  PinkCheek.be Fact Sheet for Healthcare Providers:  GravelBags.it This test is not yet ap proved or cleared by the [place] FDA and  has been authorized for detection and/or diagnosis of SARS-CoV-2 by FDA under an Emergency Use Authorization (EUA). This EUA will remain  in effect (meaning this test can be used) for the duration of the COVID-19 declaration under Section  564(b)(1) of the Act, 21 U.S.C. section 360bbb-3(b)(1), unless the authorization is terminated or revoked sooner.    Influenza A by PCR NEGATIVE NEGATIVE Final   Influenza B by PCR NEGATIVE NEGATIVE Final    Comment: (NOTE) The Xpert Xpress SARS-CoV-2/FLU/RSV assay is intended as an aid in  the diagnosis of influenza from Nasopharyngeal swab specimens and  should not be used as a sole basis for treatment. Nasal washings and  aspirates are unacceptable for Xpert Xpress SARS-CoV-2/FLU/RSV  testing. Fact Sheet for Patients: PinkCheek.be Fact Sheet for Healthcare Providers: GravelBags.it This test is not yet approved or cleared by the [place] FDA and  has been authorized for detection and/or diagnosis of SARS-CoV-2 by  FDA under an Emergency Use Authorization (EUA). This EUA will remain  in effect (meaning this test can be used) for the duration of the  Covid-19 declaration under Section 564(b)(1) of the Act, 21  U.S.C. section 360bbb-3(b)(1), unless the authorization is  terminated or revoked. Performed at [facility], [address] [place], [place] [address]   Respiratory Panel by RT PCR (Flu A&B, Covid) - Nasopharyngeal Swab     Status: None   Collection Time: 01/27/19 11:02 PM   Specimen: Nasopharyngeal Swab  Result Value Ref Range Status   SARS Coronavirus 2 by RT PCR NEGATIVE NEGATIVE Final    Comment: (NOTE) SARS-CoV-2 target nucleic acids are NOT DETECTED. The SARS-CoV-2 RNA is generally detectable in upper respiratoy specimens during the acute phase of infection. The lowest concentration of SARS-CoV-2 viral copies this assay can detect is 131 copies/mL. A negative result does not preclude SARS-Cov-2 infection and should not be used as the sole basis for treatment or other patient management decisions. A negative result may occur with  improper specimen collection/handling, submission of specimen  other than nasopharyngeal swab, presence of viral mutation(s) within the areas targeted by this assay, and inadequate number of viral copies (<131 copies/mL). A negative result must be combined with clinical observations, patient history, and epidemiological information. The expected result is Negative. Fact Sheet for Patients:  PinkCheek.be Fact Sheet for Healthcare Providers:  GravelBags.it This test is not yet ap proved or cleared by the [place] FDA and  has been authorized for detection and/or diagnosis of SARS-CoV-2 by FDA under an Emergency Use Authorization (EUA). This EUA will remain  in effect (meaning this test can be used) for the duration of the COVID-19 declaration under Section 564(b)(1) of the Act, 21 U.S.C. section 360bbb-3(b)(1), unless the authorization is terminated or revoked sooner.    Influenza A by PCR NEGATIVE NEGATIVE Final   Influenza B by PCR NEGATIVE NEGATIVE Final    Comment: (NOTE) The Xpert Xpress SARS-CoV-2/FLU/RSV assay is intended as an aid in  the diagnosis of influenza from Nasopharyngeal swab specimens and  should not be used as a sole basis for treatment. Nasal washings and  aspirates are unacceptable for Xpert Xpress SARS-CoV-2/FLU/RSV  testing. Fact Sheet for Patients:  PinkCheek.be Fact Sheet for Healthcare Providers: GravelBags.it This test is not yet approved or cleared by the [place] FDA and  has been authorized for detection and/or diagnosis of SARS-CoV-2 by  FDA under an Emergency Use Authorization (EUA). This EUA will remain  in effect (meaning this test can be used) for the duration of the  Covid-19 declaration under Section 564(b)(1) of the Act, 21  U.S.C. section 360bbb-3(b)(1), unless the authorization is  terminated or revoked. Performed at [facility], [address] [place], [place]  [address]      Labs: BNP (last 3 results) Recent Labs    06/15/18 0154 10/12/18 0944 01/27/19 2107  BNP 67.0 52.2 98.9   Basic Metabolic Panel: Recent Labs  Lab 01/22/19 0618 01/23/19 0440 01/27/19 2107 01/28/19 0330  NA 137 135 131* 133*  K 4.4 4.1 3.3* 3.6  CL 104 101 99 103  CO2 27 25 21* 20*  GLUCOSE 82 80 87 75  BUN 24* 24* 29* 25*  CREATININE 1.69* 1.67* 2.09* 1.89*  CALCIUM 8.4* 8.6* 7.9* 8.0*   Liver Function Tests: Recent Labs  Lab 01/27/19 2107  AST 34  ALT 28  ALKPHOS 115  BILITOT 0.4  PROT 6.5  ALBUMIN 3.2*   Recent Labs  Lab 01/27/19 2107  LIPASE 40   No results for input(s): AMMONIA in the last 168 hours. CBC: Recent Labs  Lab 01/22/19 0618 01/23/19 0440 01/27/19 2107 01/28/19 0021 01/28/19 0330  WBC 7.3 8.5 11.0* 11.2* 10.8*  NEUTROABS  --   --  7.2  --   --   HGB 8.5* 8.8* 8.8* 8.8* 8.4*  HCT 27.3* 29.3* 28.0* 27.4* 26.9*  MCV 66.1* 67.7* 66.0* 65.2* 66.1*  PLT 158 193 177 170 134*   Cardiac Enzymes: No results for input(s): CKTOTAL, CKMB, CKMBINDEX, TROPONINI in the last 168 hours. BNP: Invalid input(s): POCBNP CBG: Recent Labs  Lab 01/22/19 1703 01/22/19 2306 01/23/19 0735 01/28/19 0826 01/28/19 1240  GLUCAP 84 70 83 55* 82   D-Dimer No results for input(s): DDIMER in the last 72 hours. Hgb A1c Recent Labs    01/28/19 0021  HGBA1C 5.6   Lipid Profile Recent Labs    01/28/19 0330  CHOL 106  HDL 56  LDLCALC 40  TRIG 52  CHOLHDL 1.9   Thyroid function studies No results for input(s): TSH, T4TOTAL, T3FREE, THYROIDAB in the last 72 hours.  Invalid input(s): FREET3 Anemia work up No results for input(s): VITAMINB12, FOLATE, FERRITIN, TIBC, IRON, RETICCTPCT in the last 72 hours. Urinalysis    Component Value Date/Time   COLORURINE STRAW (A) 06/25/2018 2131   APPEARANCEUR HAZY (A) 06/25/2018 2131   APPEARANCEUR Cloudy (A) 07/17/2014 1455   LABSPEC 1.003 (L) 06/25/2018 2131   LABSPEC 1.012 12/29/2013 1210    PHURINE 6.0 06/25/2018 2131   GLUCOSEU NEGATIVE 06/25/2018 2131   GLUCOSEU >=500 12/29/2013 1210   HGBUR NEGATIVE 06/25/2018 2131   HGBUR negative 04/14/2010 1435   BILIRUBINUR negative 11/04/2018 1105   BILIRUBINUR Negative 07/17/2014 1455   BILIRUBINUR Negative 12/29/2013 [place] 06/25/2018 2131   PROTEINUR Positive (A) 11/04/2018 1105   PROTEINUR NEGATIVE 06/25/2018 2131   UROBILINOGEN 0.2 11/04/2018 1105   UROBILINOGEN negative 04/14/2010 1435   NITRITE positive 11/04/2018 1105   NITRITE NEGATIVE 06/25/2018 2131   LEUKOCYTESUR Large (3+) (A) 11/04/2018 1105   LEUKOCYTESUR NEGATIVE 06/25/2018 2131   LEUKOCYTESUR Negative 12/29/2013 1210   Sepsis Labs Invalid input(s): PROCALCITONIN,  WBC,  LACTICIDVEN Microbiology  Recent Results (from the past 240 hour(s))  Respiratory Panel by RT PCR (Flu A&B, Covid) - Nasopharyngeal Swab     Status: None   Collection Time: 01/19/19  8:36 PM   Specimen: Nasopharyngeal Swab  Result Value Ref Range Status   SARS Coronavirus 2 by RT PCR NEGATIVE NEGATIVE Final    Comment: (NOTE) SARS-CoV-2 target nucleic acids are NOT DETECTED. The SARS-CoV-2 RNA is generally detectable in upper respiratoy specimens during the acute phase of infection. The lowest concentration of SARS-CoV-2 viral copies this assay can detect is 131 copies/mL. A negative result does not preclude SARS-Cov-2 infection and should not be used as the sole basis for treatment or other patient management decisions. A negative result may occur with  improper specimen collection/handling, submission of specimen other than nasopharyngeal swab, presence of viral mutation(s) within the areas targeted by this assay, and inadequate number of viral copies (<131 copies/mL). A negative result must be combined with clinical observations, patient history, and epidemiological information. The expected result is Negative. Fact Sheet for Patients:   PinkCheek.be Fact Sheet for Healthcare Providers:  GravelBags.it This test is not yet ap proved or cleared by the [place] FDA and  has been authorized for detection and/or diagnosis of SARS-CoV-2 by FDA under an Emergency Use Authorization (EUA). This EUA will remain  in effect (meaning this test can be used) for the duration of the COVID-19 declaration under Section 564(b)(1) of the Act, 21 U.S.C. section 360bbb-3(b)(1), unless the authorization is terminated or revoked sooner.    Influenza A by PCR NEGATIVE NEGATIVE Final   Influenza B by PCR NEGATIVE NEGATIVE Final    Comment: (NOTE) The Xpert Xpress SARS-CoV-2/FLU/RSV assay is intended as an aid in  the diagnosis of influenza from Nasopharyngeal swab specimens and  should not be used as a sole basis for treatment. Nasal washings and  aspirates are unacceptable for Xpert Xpress SARS-CoV-2/FLU/RSV  testing. Fact Sheet for Patients: PinkCheek.be Fact Sheet for Healthcare Providers: GravelBags.it This test is not yet approved or cleared by the [place] FDA and  has been authorized for detection and/or diagnosis of SARS-CoV-2 by  FDA under an Emergency Use Authorization (EUA). This EUA will remain  in effect (meaning this test can be used) for the duration of the  Covid-19 declaration under Section 564(b)(1) of the Act, 21  U.S.C. section 360bbb-3(b)(1), unless the authorization is  terminated or revoked. Performed at [facility], [address] [place], [place] [address]   Respiratory Panel by RT PCR (Flu A&B, Covid) - Nasopharyngeal Swab     Status: None   Collection Time: 01/27/19 11:02 PM   Specimen: Nasopharyngeal Swab  Result Value Ref Range Status   SARS Coronavirus 2 by RT PCR NEGATIVE NEGATIVE Final    Comment: (NOTE) SARS-CoV-2 target nucleic acids are NOT DETECTED. The SARS-CoV-2  RNA is generally detectable in upper respiratoy specimens during the acute phase of infection. The lowest concentration of SARS-CoV-2 viral copies this assay can detect is 131 copies/mL. A negative result does not preclude SARS-Cov-2 infection and should not be used as the sole basis for treatment or other patient management decisions. A negative result may occur with  improper specimen collection/handling, submission of specimen other than nasopharyngeal swab, presence of viral mutation(s) within the areas targeted by this assay, and inadequate number of viral copies (<131 copies/mL). A negative result must be combined with clinical observations, patient history, and epidemiological information. The expected result is Negative. Fact Sheet for Patients:  PinkCheek.be Fact Sheet for Healthcare Providers:  GravelBags.it This test is not yet ap proved or cleared by the [place] FDA and  has been authorized for detection and/or diagnosis of SARS-CoV-2 by FDA under an Emergency Use Authorization (EUA). This EUA will remain  in effect (meaning this test can be used) for the duration of the COVID-19 declaration under Section 564(b)(1) of the Act, 21 U.S.C. section 360bbb-3(b)(1), unless the authorization is terminated or revoked sooner.    Influenza A by PCR NEGATIVE NEGATIVE Final   Influenza B by PCR NEGATIVE NEGATIVE Final    Comment: (NOTE) The Xpert Xpress SARS-CoV-2/FLU/RSV assay is intended as an aid in  the diagnosis of influenza from Nasopharyngeal swab specimens and  should not be used as a sole basis for treatment. Nasal washings and  aspirates are unacceptable for Xpert Xpress SARS-CoV-2/FLU/RSV  testing. Fact Sheet for Patients: PinkCheek.be Fact Sheet for Healthcare Providers: GravelBags.it This test is not yet approved or cleared by the [place] FDA  and  has been authorized for detection and/or diagnosis of SARS-CoV-2 by  FDA under an Emergency Use Authorization (EUA). This EUA will remain  in effect (meaning this test can be used) for the duration of the  Covid-19 declaration under Section 564(b)(1) of the Act, 21  U.S.C. section 360bbb-3(b)(1), unless the authorization is  terminated or revoked. Performed at [facility], [address] [place], [place] [address]      Time coordinating discharge: Over 30 minutes  SIGNED:   Ezekiel Slocumb, DO [facility] 01/28/2019, 5:07 PM   If 7PM-7AM, please contact night-coverage [email or web address] Password [email or web address]

## 2019-01-28 NOTE — Progress Notes (Signed)
Cardiology Consultation:   Patient ID: [name] MRN: [id number]; DOB: [birth year]  Admit date: 01/27/2019 Date of Consult: 01/28/2019  Primary Care Provider: [name], MD Primary Cardiologist: [name], MD [facility] Reason for consult: Shortness of breath, neck pain Physician requesting consult: Dr. Para March   Patient Profile:   48 year old woman known to me from clinic with history of coronary artery disease, prior stenting LAD diagonal vessel, ischemic cardiomyopathy ejection fraction 45 to 50% with normalization September 2017, chronic kidney disease, prior strokes, PAD with left carotid to subclavian artery bypass, prior gastric bypass surgery, who presents to the hospital with left neck discomfort radiating to left shoulder and shortness of breath  History of Present Illness:   [name] was recently inpatient last week for symptoms of shortness of breath.  Cardiac enzymes negative, was discharged on Lasix daily.  Blood pressure medications adjusted given she was hypertensive on arrival last admission. CT scan last admission with no acute findings, no PE -Of note lab work showing anemia, creatinine 1.5  Reports that she continues to get chronic nausea, occasional diarrhea She has been referred to GI as outpatient Primary care has her on Metformin Appetite is poor, missing meals, sugars running low but continues to take Metformin  Over Christmas felt weaker, missing meals, felt sugar was running low. Reports chronic shortness of breath, mentioned this to her son who keeps pushing her to go to the emergency room  Was riding in the car over Christmas going to visit family had neck discomfort lasting 10 minutes radiating to anterior part of her shoulder.  Some discomfort on pushing on the neck, felt it might be musculoskeletal and was going to take Aleve but did not get to take it.  Again son concerned of her pain and suggested she go be evaluated in the  emergency room  In the emergency room cardiac enzymes negative, EKG unchanged Blood pressure on arrival 140s over 90 9 8% on room air  On my evaluation discussion with her, she denied having significant chest pain predominantly a chronic shortness of breath that has been with her for several weeks, neck and left shoulder pain tender on palpation  On further discussion reports that she typically sees [name] for her anemia, has not seen him in quite some time Iron load last week, was not repleted Typically [name] does this, last in September   Heart Pathway Score:  HEAR Score: 4  Past Medical History:  Diagnosis Date  . Acute pyelonephritis   . Anemia    iron deficiency anemia  . Aortic arch aneurysm (HCC)   . Bipolar disorder (HCC)   . BRCA negative 2014  . CAD (coronary artery disease)    a. 08/2003 Cath: LAD 30-40-med Rx; b. 11/2014 PCI: LAD 83m (3.25x23 Xience Alpine DES); c. 06/2015 PCI: D1 (2.25x12 Resolute Integrity DES); d. 06/2017 PCI: Patent mLAD stent, D2 95 (PTCA); e. 09/2017 PCI: D2 99ost (CBA); d. 12/2017 Cath: LM nl, LAD 13m, 80d (small), D1 40ost, D2 95ost, LCX 40p, RCA 40ost/p->Med rx for D2 given restenosis.  . CKD (chronic kidney disease), stage III   . Colon polyp   . CVA (cerebral vascular accident) (HCC)    Left side weakness.   . Diabetes (HCC)   . Family history of breast cancer    BRCA neg 2014  . Gastric ulcer 04/27/2011  . History of echocardiogram    a. 03/2017 Echo: EF 60-65%, no rwma; b. 02/2018 Echo: EF 60-65%,  no rwma. Nl RV fxn. No cardiac source of emboli (admitted w/ stroke).  Marland Kitchen HTN (hypertension)   . Hyperlipemia   . Hypothyroid   . Malignant melanoma of skin of scalp (HCC)   . MI, acute, non ST segment elevation (HCC)   . Neuromuscular disorder (HCC)   . Orthostatic hypotension   . S/P drug eluting coronary stent placement 06/04/2015  . Sepsis (HCC) 03/18/2017  . Stroke [facility])    a. 02/2018 MRI: 7mm late acute/early subacute L medial  frontal lobe inarct; b. 02/2018 MRA No large vessel occlusion or aneurysm. Mod to sev L P2 stenosis. thready L vertebral artery, diffusely dzs'd; c. 02/2018 Carotid U/S: <50% bilat ICA dzs.    Past Surgical History:  Procedure Laterality Date  . APPENDECTOMY    . CARDIAC CATHETERIZATION N/A 11/09/2014   Procedure: Coronary Angiography;  Surgeon: Antonieta Iba, MD;  Location: [facility];  Service: Cardiovascular;  Laterality: N/A;  . CARDIAC CATHETERIZATION N/A 11/12/2014   Procedure: Coronary Stent Intervention;  Surgeon: Marcina Millard, MD;  Location: [facility];  Service: Cardiovascular;  Laterality: N/A;  . CARDIAC CATHETERIZATION N/A 04/18/2015   Procedure: Left Heart Cath and Coronary Angiography;  Surgeon: Antonieta Iba, MD;  Location: [facility];  Service: Cardiovascular;  Laterality: N/A;  . CARDIAC CATHETERIZATION Left 06/04/2015   Procedure: Left Heart Cath and Coronary Angiography;  Surgeon: Iran Ouch, MD;  Location: [facility];  Service: Cardiovascular;  Laterality: Left;  . CARDIAC CATHETERIZATION N/A 06/04/2015   Procedure: Coronary Stent Intervention;  Surgeon: Iran Ouch, MD;  Location: [facility];  Service: Cardiovascular;  Laterality: N/A;  . CESAREAN SECTION  2001  . CHOLECYSTECTOMY N/A 11/18/2016   Procedure: LAPAROSCOPIC CHOLECYSTECTOMY WITH INTRAOPERATIVE CHOLANGIOGRAM;  Surgeon: Kieth Brightly, MD;  Location: [facility] OR[facility];  Service: General;  Laterality: N/A;  . COLONOSCOPY WITH PROPOFOL N/A 04/27/2016   Procedure: COLONOSCOPY WITH PROPOFOL;  Surgeon: Midge Minium, MD;  Location: [facility];  Service: Endoscopy;  Laterality: N/A;  . COLONOSCOPY WITH PROPOFOL N/A 01/12/2018   Procedure: COLONOSCOPY WITH PROPOFOL;  Surgeon: Toledo, Boykin Nearing, MD;  Location: [facility];  Service: Endoscopy;  Laterality: N/A;  . CORONARY ANGIOPLASTY    . CORONARY BALLOON ANGIOPLASTY N/A 06/29/2017   Procedure:  CORONARY BALLOON ANGIOPLASTY;  Surgeon: Iran Ouch, MD;  Location: [facility];  Service: Cardiovascular;  Laterality: N/A;  . CORONARY BALLOON ANGIOPLASTY N/A 09/20/2017   Procedure: CORONARY BALLOON ANGIOPLASTY;  Surgeon: Iran Ouch, MD;  Location: [facility];  Service: Cardiovascular;  Laterality: N/A;  . DILATION AND CURETTAGE OF UTERUS    . ESOPHAGOGASTRODUODENOSCOPY (EGD) WITH PROPOFOL N/A 09/14/2014   Procedure: ESOPHAGOGASTRODUODENOSCOPY (EGD) WITH PROPOFOL;  Surgeon: Elnita Maxwell, MD;  Location: [facility];  Service: Endoscopy;  Laterality: N/A;  . ESOPHAGOGASTRODUODENOSCOPY (EGD) WITH PROPOFOL N/A 04/27/2016   Procedure: ESOPHAGOGASTRODUODENOSCOPY (EGD) WITH PROPOFOL;  Surgeon: Midge Minium, MD;  Location: [facility];  Service: Endoscopy;  Laterality: N/A;  Diabetic - oral meds  . ESOPHAGOGASTRODUODENOSCOPY (EGD) WITH PROPOFOL N/A 01/12/2018   Procedure: ESOPHAGOGASTRODUODENOSCOPY (EGD) WITH PROPOFOL;  Surgeon: Toledo, Boykin Nearing, MD;  Location: [facility];  Service: Endoscopy;  Laterality: N/A;  . GASTRIC BYPASS  09/2009   [facility]   . INTRAMEDULLARY (IM) NAIL INTERTROCHANTERIC Left 10/13/2018   Procedure: INTRAMEDULLARY (IM) NAIL INTERTROCHANTRIC;  Surgeon: Tarry Kos, MD;  Location: [facility] OR;  Service: Orthopedics;  Laterality: Left;  . Left Carotid to sublcavian artery bypass w/ subclavian artery ligation     a. Performed @ [place].  . LEFT HEART CATH AND CORONARY ANGIOGRAPHY Left 06/29/2017   Procedure: LEFT HEART CATH AND CORONARY ANGIOGRAPHY;  Surgeon: Iran Ouch, MD;  Location: [facility];  Service: Cardiovascular;  Laterality: Left;  . LEFT HEART CATH AND CORONARY ANGIOGRAPHY N/A 09/20/2017   Procedure: LEFT HEART CATH AND CORONARY ANGIOGRAPHY;  Surgeon: Iran Ouch, MD;  Location: [facility];  Service: Cardiovascular;  Laterality: N/A;  . LEFT HEART CATH AND CORONARY  ANGIOGRAPHY N/A 12/20/2017   Procedure: LEFT HEART CATH AND CORONARY ANGIOGRAPHY;  Surgeon: Iran Ouch, MD;  Location: [facility];  Service: Cardiovascular;  Laterality: N/A;  . MELANOMA EXCISION  2016   Dr. Adolphus Birchwood  . NOVASURE ABLATION  2002  . RIGHT OOPHORECTOMY    . SHOULDER ARTHROSCOPY WITH OPEN ROTATOR CUFF REPAIR Right 01/07/2016   Procedure: SHOULDER ARTHROSCOPY WITH DEBRIDMENT, SUBACHROMIAL DECOMPRESSION;  Surgeon: Christena Flake, MD;  Location: [facility] OR[facility];  Service: Orthopedics;  Laterality: Right;  . SHOULDER ARTHROSCOPY WITH OPEN ROTATOR CUFF REPAIR Right 03/16/2017   Procedure: SHOULDER ARTHROSCOPY WITH OPEN ROTATOR CUFF REPAIR POSSIBLE BICEPS TENODESIS;  Surgeon: Christena Flake, MD;  Location: [facility] OR[facility];  Service: Orthopedics;  Laterality: Right;  . TRIGGER FINGER RELEASE Right     Middle Finger     Home Medications:  Prior to Admission medications   Medication Sig Start Date End Date Taking? Authorizing Provider  ALPRAZolam Prudy Feeler) 1 MG tablet TAKE 1 TABLET BY MOUTH ONCE OR TWICE A DAY FOR ANXIETY 12/09/18  Yes [name], MD  amLODipine (NORVASC) 10 MG tablet Take 1 tablet (10 mg total) by mouth daily. 01/23/19 02/22/19 Yes Charise Killian, MD  aspirin EC 81 MG EC tablet Take 1 tablet (81 mg total) by mouth daily for 30 doses. 01/23/19 02/22/19 Yes Charise Killian, MD  buPROPion (WELLBUTRIN XL) 150 MG 24 hr tablet TAKE 1 TABLET BY MOUTH EVERY DAY Patient taking differently: Take 150 mg by mouth daily.  09/27/18  Yes [name], MD  carvedilol (COREG) 12.5 MG tablet Take 1 tablet (12.5 mg total) by mouth 2 (two) times daily with a meal. 01/23/19 02/22/19 Yes Charise Killian, MD  clopidogrel (PLAVIX) 75 MG tablet Take 1 tablet (75 mg total) by mouth daily. 07/08/18  Yes Creig Hines, NP  diphenoxylate-atropine (LOMOTIL) 2.5-0.025 MG tablet Take one capsule 1-4 times daily as needed for diarrhea Patient taking differently: Take 1-4 tablets by  mouth daily. Take one capsule 1-4 times daily for diarrhea 01/10/19  Yes Fisher, Demetrios Isaacs, MD  furosemide (LASIX) 20 MG tablet Take 1 tablet (20 mg total) by mouth daily. 01/23/19 02/22/19 Yes Charise Killian, MD  gabapentin (NEURONTIN) 300 MG capsule Take 1 capsule (300 mg total) by mouth 2 (two) times daily. 10/12/18  Yes [name], MD  Galcanezumab-gnlm (EMGALITY) 120 MG/ML SOAJ INJECT 240 MG SUBCUTANEOUSLY AS DIRECTED FOR THE FIRST MONTH. 11/11/18  Yes [provider]  isosorbide mononitrate (IMDUR) 60 MG 24 hr tablet Take 1 tablet (60 mg total) by mouth daily. 01/23/19 02/22/19 Yes Charise Killian, MD  levothyroxine (SYNTHROID, LEVOTHROID) 25 MCG tablet Take 1 tablet (25 mcg total) by mouth daily before breakfast. 03/28/18  Yes [name], MD  losartan (COZAAR) 50 MG tablet Take 1 tablet (50 mg total) by mouth daily. 01/23/19 02/22/19 Yes  Charise Killian, MD  metFORMIN (GLUCOPHAGE-XR) 500 MG 24 hr tablet TAKE 1 TABLET BY MOUTH ONCE DAILY WITH DINNER 11/02/18  Yes [provider]  nitroGLYCERIN (NITROSTAT) 0.4 MG SL tablet Place 1 tablet (0.4 mg total) under the tongue every 5 (five) minutes as needed for chest pain. 06/02/17  Yes Amyrah Pinkhasov, Tollie Pizza, MD  pantoprazole (PROTONIX) 40 MG tablet Take 1 tablet (40 mg total) by mouth 2 (two) times daily. 03/02/18  Yes [name], MD  potassium chloride (KLOR-CON) 10 MEQ tablet Take 1 tablet (10 mEq total) by mouth daily. 01/23/19 02/22/19 Yes Charise Killian, MD  promethazine (PHENERGAN) 25 MG tablet Take 1 tablet (25 mg total) by mouth every 6 (six) hours as needed for nausea or vomiting. 11/07/18  Yes [name], MD  rosuvastatin (CRESTOR) 40 MG tablet Take 1 tablet (40 mg total) by mouth daily. 10/12/18  Yes [name], MD  venlafaxine XR (EFFEXOR-XR) 75 MG 24 hr capsule Take 1 capsule (75 mg total) by mouth daily with breakfast. 09/27/18  Yes [name], MD  ferrous sulfate 325 (65 FE) MG tablet  Take 1 tablet (325 mg total) by mouth daily with breakfast. Patient not taking: Reported on 01/27/2019 01/23/19 02/22/19  Charise Killian, MD    Inpatient Medications: Scheduled Meds: . amLODipine  10 mg Oral Daily  . aspirin  324 mg Oral NOW   Or  . aspirin  300 mg Rectal NOW  . aspirin EC  81 mg Oral Daily  . carvedilol  12.5 mg Oral BID WC  . clopidogrel  75 mg Oral Daily  . enoxaparin (LOVENOX) injection  40 mg Subcutaneous Q24H  . ferrous sulfate  325 mg Oral Q breakfast  . insulin aspart  0-15 Units Subcutaneous TID WC  . isosorbide mononitrate  60 mg Oral Daily  . levothyroxine  25 mcg Oral QAC breakfast  . losartan  50 mg Oral Daily  . pantoprazole  40 mg Oral BID  . potassium chloride  10 mEq Oral Daily  . venlafaxine XR  75 mg Oral Q breakfast   Continuous Infusions:  PRN Meds: acetaminophen, morphine injection, nitroGLYCERIN, ondansetron (ZOFRAN) IV  Allergies:    Allergies  Allergen Reactions  . Lipitor [Atorvastatin] Other (See Comments)    Leg pains  . Tramadol Other (See Comments)    Mouth feels like it's on fire  . Lactose Intolerance (Gi) Nausea Only    Social History:   Social History   Socioeconomic History  . Marital status: Divorced    Spouse name: Not on file  . Number of children: 1  . Years of education: Not on file  . Highest education level: Not on file  Occupational History  . 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 Disabled through 08/02/2013 per forms 02/03/2013  Tobacco Use  . Smoking status: Former Smoker    Types: Cigarettes    Quit date: 08/31/1994    Years since quitting: 24.4  . Smokeless tobacco: Never Used  . Tobacco comment: quit 28 years ago  Substance and Sexual Activity  . Alcohol use: No    Alcohol/week: 0.0 standard drinks  . Drug use: No  . Sexual activity: Not Currently    Birth control/protection: None  Other Topics  Concern  . Not on file  Social History Narrative   Previously did custolial work. Disabled as of 05/25/2012 due  to CVA causing LUE and LLE weakness.   Lives at home with son   Social Determinants of Health   Financial Resource Strain: Low Risk   . Difficulty of Paying Living Expenses: Not hard at all  Food Insecurity: No Food Insecurity  . Worried About Programme researcher, broadcasting/film/video in the Last Year: Never true  . Ran Out of Food in the Last Year: Never true  Transportation Needs: No Transportation Needs  . Lack of Transportation (Medical): No  . Lack of Transportation (Non-Medical): No  Physical Activity: Inactive  . Days of Exercise per Week: 0 days  . Minutes of Exercise per Session: 0 min  Stress: Stress Concern Present  . Feeling of Stress : Very much  Social Connections: Somewhat Isolated  . Frequency of Communication with Friends and Family: More than three times a week  . Frequency of Social Gatherings with Friends and Family: Once a week  . Attends Religious Services: More than 4 times per year  . Active Member of Clubs or Organizations: No  . Attends Banker Meetings: Not asked  . Marital Status: Divorced  Catering manager Violence: Not At Risk  . Fear of Current or Ex-Partner: No  . Emotionally Abused: No  . Physically Abused: No  . Sexually Abused: No    Family History:    Family History  Problem Relation Age of Onset  . Hypertension Mother   . Anxiety disorder Mother   . Depression Mother   . Bipolar disorder Mother   . Heart disease Mother        No details  . Hyperlipidemia Mother   . Kidney disease Father   . Heart disease Father 63  . Hypertension Father   . Diabetes Father   . Stroke Father   . Colon cancer Father        dx in his 45's  . Anxiety disorder Father   . Depression Father   . Skin cancer Father   . Kidney disease Sister   . Thyroid nodules Sister   . Hypertension Sister   . Hypertension Sister   . Diabetes Sister   .  Hyperlipidemia Sister   . Depression Sister   . Breast cancer Maternal Aunt 50  . Breast cancer Maternal Aunt 50  . Ovarian cancer Cousin   . Colon cancer Cousin   . Breast cancer Other   . Kidney cancer Neg Hx   . Bladder Cancer Neg Hx      ROS:  Please see the history of present illness.  Review of Systems  Constitutional: Positive for malaise/fatigue.  HENT: Negative.   Respiratory: Positive for shortness of breath.   Cardiovascular: Negative.   Gastrointestinal: Positive for diarrhea and nausea.  Musculoskeletal: Positive for neck pain.  Neurological: Negative.   Psychiatric/Behavioral: Negative.   All other systems reviewed and are negative.    Physical Exam/Data:   Vitals:   01/28/19 1200 01/28/19 1300 01/28/19 1330 01/28/19 1400  BP: 135/80     Pulse: 66 72 92 80  Resp: 12 11 15 16   Temp:      TempSrc:      SpO2: 99% 99% 99% 99%  Weight:      Height:        Intake/Output Summary (Last 24 hours) at 01/28/2019 1426 Last data filed at 01/28/2019 1335 Gross per 24 hour  Intake 1100 ml  Output --  Net 1100 ml   Last 3 Weights 01/27/2019 01/22/2019 01/21/2019  Weight (lbs)  152 lb 150 lb 6.4 oz 152 lb 8 oz  Weight (kg) 68.947 kg 68.221 kg 69.174 kg  Some encounter information is confidential and restricted. Go to Review Flowsheets activity to see all data.     Body mass index is 27.8 kg/m.  General:  Well nourished, well developed, in no acute distress HEENT: normal Lymph: no adenopathy Neck: no JVD Endocrine:  No thryomegaly Vascular: No carotid bruits; FA pulses 2+ bilaterally without bruits  Cardiac:  normal S1, S2; RRR; no murmur  Lungs:  clear to auscultation bilaterally, no wheezing, rhonchi or rales  Abd: soft, nontender, no hepatomegaly  Ext: no edema Musculoskeletal:  No deformities, BUE and BLE strength normal and equal Skin: warm and dry  Neuro:  CNs 2-12 intact, no focal abnormalities noted Psych:  Normal affect   EKG:  The EKG was  personally reviewed and demonstrates:   Normal sinus rhythm with nonspecific ST abnormality V5 V6, no changes from previous EKG Telemetry:  Telemetry was personally reviewed and demonstrates:   Normal sinus rhythm  Relevant CV Studies: Echocardiogram normal LV function, normal RV function, no significant valve disease, normal/low right heart pressures  Laboratory Data:  High Sensitivity Troponin:   Recent Labs  Lab 01/20/19 0826 01/21/19 0454 01/27/19 2107 01/28/19 0021 01/28/19 0958  TROPONINIHS 16 11 13 15 12      Chemistry Recent Labs  Lab 01/23/19 0440 01/27/19 2107 01/28/19 0330  NA 135 131* 133*  K 4.1 3.3* 3.6  CL 101 99 103  CO2 25 21* 20*  GLUCOSE 80 87 75  BUN 24* 29* 25*  CREATININE 1.67* 2.09* 1.89*  CALCIUM 8.6* 7.9* 8.0*  GFRNONAA 35* 27* 30*  GFRAA 41* 31* 35*  ANIONGAP 9 11 10     Recent Labs  Lab 01/27/19 2107  PROT 6.5  ALBUMIN 3.2*  AST 34  ALT 28  ALKPHOS 115  BILITOT 0.4   Hematology Recent Labs  Lab 01/27/19 2107 01/28/19 0021 01/28/19 0330  WBC 11.0* 11.2* 10.8*  RBC 4.24 4.20 4.07  HGB 8.8* 8.8* 8.4*  HCT 28.0* 27.4* 26.9*  MCV 66.0* 65.2* 66.1*  MCH 20.8* 21.0* 20.6*  MCHC 31.4 32.1 31.2  RDW 16.5* 16.4* 16.4*  PLT 177 170 134*   BNP Recent Labs  Lab 01/27/19 2107  BNP 40.0    DDimer No results for input(s): DDIMER in the last 168 hours.   Radiology/Studies:  DG Chest 2 View  Result Date: 01/27/2019 CLINICAL DATA:  48 year old female with chest pain. EXAM: CHEST - 2 VIEW COMPARISON:  Chest radiograph dated 01/19/2019. FINDINGS: Shallow inspiration. Bilateral perihilar streaky densities, likely atelectasis. Developing infiltrate is less likely. Clinical correlation is recommended. No focal consolidation, pleural effusion, or pneumothorax. Stable cardiac silhouette. Surgical clips noted in the upper mediastinum. No acute osseous pathology. There is air distention the colon. IMPRESSION: 1. No focal consolidation. 2.  Bilateral perihilar streaky densities, likely atelectasis. Developing infiltrate is less likely. Electronically Signed   By: Elgie Collard M.D.   On: 01/27/2019 21:17       HEAR Score (for undifferentiated chest pain):  HEAR Score: 4    Assessment and Plan:   1. Shortness of breath Chronic issue, reports it is there all day Has been there chronically past several weeks --Less likely ischemia, most likely secondary to underlying anemia -Adding Lasix did not help her shortness of breath symptoms, because of renal dysfunction creatinine of 2 which is above her baseline --She reports inhalers did not help her shortness  of breath symptoms --Normal echocardiogram no significant valve disease --- Iron low on check last week, would recommend iron infusion and then follow-up with [name] for continued checks --The above was discussed with her -Currently she appears very comfortable, no distress --Suggest outpatient clinic follow-up with our office --Given renal failure, if her shortness of breath persist despite fixing anemia, could consider outpatient stress testing  2.  Neck pain/shoulder pain Musculoskeletal in nature Cardiac enzymes negative EKG unchanged -She denies significant chest pain symptoms -Very deconditioned at baseline, She may benefit from cardiac rehab  3.  Anemia Iron deficiency, in the setting of renal failure Would benefit from iron infusion, follow-up with hematology -Appetite has been poor, losing weight  4.  Chronic nausea, diarrhea Suggested she stop the Metformin Likely contributing to loose bowel movements, drop in potassium, And in the setting of missing meals and low sugar, renal disease, this is not the best medication   Total encounter time more than 110 minutes  Greater than 50% was spent in counseling and coordination of care with the patient   For questions or updates, please contact [facility] Please consult [email or web address] for contact  info under     Signed, [name], MD  01/28/2019 2:26 PM

## 2019-01-28 NOTE — Care Management CC44 (Signed)
Condition Code 44 Documentation Completed  Patient Details  Name: [name] MRN: [id number] Date of Birth: 04-24-1970   Condition Code 44 given:  Yes Patient signature on Condition Code 44 notice:  Yes Documentation of 2 MD's agreement:  Yes Code 44 added to claim:  Yes    [place], LCSW 01/28/2019, 3:35 PM

## 2019-01-30 ENCOUNTER — Telehealth: Payer: Self-pay | Admitting: Family Medicine

## 2019-01-30 ENCOUNTER — Telehealth: Payer: Self-pay | Admitting: Cardiovascular Disease

## 2019-01-30 ENCOUNTER — Ambulatory Visit (INDEPENDENT_AMBULATORY_CARE_PROVIDER_SITE_OTHER): Payer: Medicare Other | Admitting: Physician Assistant

## 2019-01-30 ENCOUNTER — Encounter: Payer: Self-pay | Admitting: Physician Assistant

## 2019-01-30 ENCOUNTER — Other Ambulatory Visit: Payer: Self-pay

## 2019-01-30 ENCOUNTER — Ambulatory Visit: Payer: Medicare Other | Admitting: Family

## 2019-01-30 VITALS — BP 111/70 | HR 95 | Temp 96.8°F | Wt 154.0 lb

## 2019-01-30 DIAGNOSIS — N390 Urinary tract infection, site not specified: Secondary | ICD-10-CM | POA: Diagnosis not present

## 2019-01-30 LAB — POCT URINALYSIS DIPSTICK
Bilirubin, UA: NEGATIVE
Glucose, UA: NEGATIVE
Ketones, UA: NEGATIVE
Nitrite, UA: NEGATIVE
Protein, UA: POSITIVE — AB
Spec Grav, UA: 1.01 (ref 1.010–1.025)
Urobilinogen, UA: 0.2 E.U./dL
pH, UA: 8.5 — AB (ref 5.0–8.0)

## 2019-01-30 MED ORDER — SULFAMETHOXAZOLE-TRIMETHOPRIM 800-160 MG PO TABS: 1.0000 | ORAL_TABLET | Freq: Two times a day (BID) | ORAL | 0 refills | Status: AC

## 2019-01-30 NOTE — Patient Instructions (Signed)
Urinary Tract Infection, Adult  A urinary tract infection (UTI) is an infection of any part of the urinary tract. The urinary tract includes the kidneys, ureters, bladder, and urethra. These organs make, store, and get rid of urine in the body. Your health care provider may use other names to describe the infection. An upper UTI affects the ureters and kidneys (pyelonephritis). A lower UTI affects the bladder (cystitis) and urethra (urethritis). What are the causes? Most urinary tract infections are caused by bacteria in your genital area, around the entrance to your urinary tract (urethra). These bacteria grow and cause inflammation of your urinary tract. What increases the risk? You are more likely to develop this condition if:  You have a urinary catheter that stays in place (indwelling).  You are not able to control when you urinate or have a bowel movement (you have incontinence).  You are female and you: ? Use a spermicide or diaphragm for birth control. ? Have low estrogen levels. ? Are pregnant.  You have certain genes that increase your risk (genetics).  You are sexually active.  You take antibiotic medicines.  You have a condition that causes your flow of urine to slow down, such as: ? An enlarged prostate, if you are female. ? Blockage in your urethra (stricture). ? A kidney stone. ? A nerve condition that affects your bladder control (neurogenic bladder). ? Not getting enough to drink, or not urinating often.  You have certain medical conditions, such as: ? Diabetes. ? A weak disease-fighting system (immunesystem). ? Sickle cell disease. ? Gout. ? Spinal cord injury. What are the signs or symptoms? Symptoms of this condition include:  Needing to urinate right away (urgently).  Frequent urination or passing small amounts of urine frequently.  Pain or burning with urination.  Blood in the urine.  Urine that smells bad or unusual.  Trouble urinating.  Cloudy  urine.  Vaginal discharge, if you are female.  Pain in the abdomen or the lower back. You may also have:  Vomiting or a decreased appetite.  Confusion.  Irritability or tiredness.  A fever.  Diarrhea. The first symptom in older adults may be confusion. In some cases, they may not have any symptoms until the infection has worsened. How is this diagnosed? This condition is diagnosed based on your medical history and a physical exam. You may also have other tests, including:  Urine tests.  Blood tests.  Tests for sexually transmitted infections (STIs). If you have had more than one UTI, a cystoscopy or imaging studies may be done to determine the cause of the infections. How is this treated? Treatment for this condition includes:  Antibiotic medicine.  Over-the-counter medicines to treat discomfort.  Drinking enough water to stay hydrated. If you have frequent infections or have other conditions such as a kidney stone, you may need to see a health care provider who specializes in the urinary tract (urologist). In rare cases, urinary tract infections can cause sepsis. Sepsis is a life-threatening condition that occurs when the body responds to an infection. Sepsis is treated in the hospital with IV antibiotics, fluids, and other medicines. Follow these instructions at home:  Medicines  Take over-the-counter and prescription medicines only as told by your health care provider.  If you were prescribed an antibiotic medicine, take it as told by your health care provider. Do not stop using the antibiotic even if you start to feel better. General instructions  Make sure you: ? Empty your bladder often and   completely. Do not hold urine for long periods of time. ? Empty your bladder after sex. ? Wipe from front to back after a bowel movement if you are female. Use each tissue one time when you wipe.  Drink enough fluid to keep your urine pale yellow.  Keep all follow-up  visits as told by your health care provider. This is important. Contact a health care provider if:  Your symptoms do not get better after 1-2 days.  Your symptoms go away and then return. Get help right away if you have:  Severe pain in your back or your lower abdomen.  A fever.  Nausea or vomiting. Summary  A urinary tract infection (UTI) is an infection of any part of the urinary tract, which includes the kidneys, ureters, bladder, and urethra.  Most urinary tract infections are caused by bacteria in your genital area, around the entrance to your urinary tract (urethra).  Treatment for this condition often includes antibiotic medicines.  If you were prescribed an antibiotic medicine, take it as told by your health care provider. Do not stop using the antibiotic even if you start to feel better.  Keep all follow-up visits as told by your health care provider. This is important. This information is not intended to replace advice given to you by your health care provider. Make sure you discuss any questions you have with your health care provider. Document Released: 10/29/2004 Document Revised: 01/06/2018 Document Reviewed: 07/29/2017 Elsevier Patient Education  2020 [employer]  

## 2019-01-30 NOTE — Telephone Encounter (Signed)
-----   Message from Minna Merritts, MD sent at 01/29/2019  9:20 AM EST ----- Needs follow up in clinic TCM D/c 01/28/19 SOB, HTN Thx TG

## 2019-01-30 NOTE — Telephone Encounter (Signed)
Patient is needing a week f/u appt.for a hospital followup.  Please let her know when she can get in with Dr. Caryn Section.

## 2019-01-30 NOTE — Progress Notes (Signed)
Patient: [name] Female    DOB: 08-May-1970   48 y.o.   MRN: [id number] Visit Date: 01/30/2019  Today's Provider: Trinna Post, PA-C   Chief Complaint  Patient presents with  . Urinary Frequency   Subjective:    Patient with history of recurrent UTI, most recently in 11/2018 presenting with below symptoms. Last UTI was klebsiella.   Urinary Frequency  This is a recurrent problem. The current episode started in the past 7 days. The problem occurs every urination. The problem has been unchanged. The quality of the pain is described as burning. The pain is at a severity of 0/10. The patient is experiencing no pain. There has been no fever. Associated symptoms include frequency and hesitancy. She has tried nothing for the symptoms. The treatment provided no relief. Her past medical history is significant for recurrent UTIs.     Allergies  Allergen Reactions  . Lipitor [Atorvastatin] Other (See Comments)    Leg pains  . Tramadol Other (See Comments)    Mouth feels like it's on fire  . Lactose Intolerance (Gi) Nausea Only     Current Outpatient Medications:  .  ALPRAZolam (XANAX) 1 MG tablet, TAKE 1 TABLET BY MOUTH ONCE OR TWICE A DAY FOR ANXIETY, Disp: 45 tablet, Rfl: 3 .  amLODipine (NORVASC) 10 MG tablet, Take 1 tablet (10 mg total) by mouth daily., Disp: 30 tablet, Rfl: 0 .  aspirin EC 81 MG EC tablet, Take 1 tablet (81 mg total) by mouth daily for 30 doses., Disp: 30 tablet, Rfl: 0 .  buPROPion (WELLBUTRIN XL) 150 MG 24 hr tablet, TAKE 1 TABLET BY MOUTH EVERY DAY (Patient taking differently: Take 150 mg by mouth daily. ), Disp: 90 tablet, Rfl: 4 .  carvedilol (COREG) 12.5 MG tablet, Take 1 tablet (12.5 mg total) by mouth 2 (two) times daily with a meal., Disp: 60 tablet, Rfl: 0 .  clopidogrel (PLAVIX) 75 MG tablet, Take 1 tablet (75 mg total) by mouth daily., Disp: 90 tablet, Rfl: 3 .  diphenoxylate-atropine (LOMOTIL) 2.5-0.025 MG tablet, Take one capsule  1-4 times daily as needed for diarrhea (Patient taking differently: Take 1-4 tablets by mouth daily. Take one capsule 1-4 times daily for diarrhea), Disp: 30 tablet, Rfl: 3 .  furosemide (LASIX) 20 MG tablet, Take 1 tablet (20 mg total) by mouth 3 (three) times a week. On Monday, Wednesday and Friday, Disp: 12 tablet, Rfl: 1 .  gabapentin (NEURONTIN) 300 MG capsule, Take 1 capsule (300 mg total) by mouth 2 (two) times daily., Disp: 180 capsule, Rfl: 4 .  Galcanezumab-gnlm (EMGALITY) 120 MG/ML SOAJ, INJECT 240 MG SUBCUTANEOUSLY AS DIRECTED FOR THE FIRST MONTH., Disp: , Rfl:  .  isosorbide mononitrate (IMDUR) 60 MG 24 hr tablet, Take 1 tablet (60 mg total) by mouth daily., Disp: 30 tablet, Rfl: 0 .  levothyroxine (SYNTHROID, LEVOTHROID) 25 MCG tablet, Take 1 tablet (25 mcg total) by mouth daily before breakfast., Disp: 30 tablet, Rfl: 12 .  losartan (COZAAR) 50 MG tablet, Take 1 tablet (50 mg total) by mouth daily., Disp: 30 tablet, Rfl: 0 .  nitroGLYCERIN (NITROSTAT) 0.4 MG SL tablet, Place 1 tablet (0.4 mg total) under the tongue every 5 (five) minutes as needed for chest pain., Disp: 25 tablet, Rfl: 2 .  pantoprazole (PROTONIX) 40 MG tablet, Take 1 tablet (40 mg total) by mouth 2 (two) times daily., Disp: 60 tablet, Rfl: 5 .  potassium chloride (KLOR-CON) 10 MEQ  tablet, Take 2 tablets (20 mEq total) by mouth 3 (three) times a week. On Monday, Wednesday, Friday with Lasix, Disp: 24 tablet, Rfl: 0 .  promethazine (PHENERGAN) 25 MG tablet, Take 1 tablet (25 mg total) by mouth every 6 (six) hours as needed for nausea or vomiting., Disp: 30 tablet, Rfl: 5 .  rosuvastatin (CRESTOR) 40 MG tablet, Take 1 tablet (40 mg total) by mouth daily., Disp: 90 tablet, Rfl: 4 .  venlafaxine XR (EFFEXOR-XR) 75 MG 24 hr capsule, Take 1 capsule (75 mg total) by mouth daily with breakfast., Disp: 30 capsule, Rfl: 5 .  ferrous sulfate 325 (65 FE) MG tablet, Take 1 tablet (325 mg total) by mouth daily with breakfast. (Patient  not taking: Reported on 01/27/2019), Disp: 30 tablet, Rfl: 0  Review of Systems  Constitutional: Negative.   Respiratory: Negative.   Cardiovascular: Negative.   Genitourinary: Positive for frequency and hesitancy.  Musculoskeletal: Negative.     Social History   Tobacco Use  . Smoking status: Former Smoker    Types: Cigarettes    Quit date: 08/31/1994    Years since quitting: 24.4  . Smokeless tobacco: Never Used  . Tobacco comment: quit 28 years ago  Substance Use Topics  . Alcohol use: No    Alcohol/week: 0.0 standard drinks      Objective:   BP 111/70 (BP Location: Left Arm, Patient Position: Sitting, Cuff Size: Normal)   Pulse 95   Temp (!) 96.8 F (36 C) (Temporal)   Wt 154 lb (69.9 kg)   BMI 28.17 kg/m  Vitals:   01/30/19 1551  BP: 111/70  Pulse: 95  Temp: (!) 96.8 F (36 C)  TempSrc: Temporal  Weight: 154 lb (69.9 kg)  Body mass index is 28.17 kg/m.   Physical Exam Constitutional:      General: She is not in acute distress.    Appearance: She is well-developed. She is not diaphoretic.  Cardiovascular:     Rate and Rhythm: Normal rate and regular rhythm.  Pulmonary:     Effort: Pulmonary effort is normal.     Breath sounds: Normal breath sounds.  Abdominal:     General: Bowel sounds are normal. There is no distension.     Palpations: Abdomen is soft.     Tenderness: There is abdominal tenderness in the suprapubic area. There is no guarding or rebound.  Skin:    General: Skin is warm and dry.  Neurological:     Mental Status: She is alert and oriented to person, place, and time.  Psychiatric:        Behavior: Behavior normal.      No results found for any visits on 01/30/19.     Assessment & Plan    1. Recurrent UTI  Treat with antibiotics as below and await culture.   - sulfamethoxazole-trimethoprim (BACTRIM DS) 800-160 MG tablet; Take 1 tablet by mouth 2 (two) times daily for 5 days.  Dispense: 10 tablet; Refill: 0 - CULTURE, URINE  COMPREHENSIVE - POCT Urinalysis Dipstick      Trinna Post, PA-C  [facility]

## 2019-01-30 NOTE — Telephone Encounter (Signed)
TCM....  Patient is being discharged      They are scheduled to see Mickle Plumb on 1/5 at 130   They were seen for SOB htn   They need to be seen within 7-10 days      Please call

## 2019-01-31 ENCOUNTER — Encounter: Payer: Self-pay | Admitting: Family Medicine

## 2019-01-31 NOTE — Telephone Encounter (Signed)
Patient contacted regarding discharge from [facility] on 12/26.  Patient understands to follow up with provider Mickle Plumb on 1/5 at 1:30P at [facility]. Patient understands discharge instructions? yes Patient understands medications and regiment? yes Patient understands to bring all medications to this visit? yes

## 2019-01-31 NOTE — Telephone Encounter (Signed)
SOB from anemia She was seen in the hospital I think they gave iron infusion Needs to get back with hem/onc for follow up of anemia (dr. Grayland Ormond)

## 2019-02-01 ENCOUNTER — Encounter: Payer: Self-pay | Admitting: Oncology

## 2019-02-01 ENCOUNTER — Other Ambulatory Visit: Payer: Self-pay

## 2019-02-01 ENCOUNTER — Inpatient Hospital Stay: Payer: Medicare Other | Attending: Oncology | Admitting: Oncology

## 2019-02-01 VITALS — BP 104/74 | HR 76 | Temp 99.0°F | Resp 16 | Ht 62.0 in | Wt 154.8 lb

## 2019-02-01 DIAGNOSIS — Z803 Family history of malignant neoplasm of breast: Secondary | ICD-10-CM | POA: Insufficient documentation

## 2019-02-01 DIAGNOSIS — Z9884 Bariatric surgery status: Secondary | ICD-10-CM | POA: Diagnosis not present

## 2019-02-01 DIAGNOSIS — Z87891 Personal history of nicotine dependence: Secondary | ICD-10-CM | POA: Insufficient documentation

## 2019-02-01 DIAGNOSIS — D509 Iron deficiency anemia, unspecified: Secondary | ICD-10-CM | POA: Insufficient documentation

## 2019-02-01 DIAGNOSIS — R195 Other fecal abnormalities: Secondary | ICD-10-CM | POA: Diagnosis not present

## 2019-02-01 DIAGNOSIS — Z800 Family history of malignant neoplasm of digestive organs: Secondary | ICD-10-CM

## 2019-02-01 DIAGNOSIS — R5383 Other fatigue: Secondary | ICD-10-CM | POA: Diagnosis not present

## 2019-02-01 DIAGNOSIS — N189 Chronic kidney disease, unspecified: Secondary | ICD-10-CM | POA: Diagnosis not present

## 2019-02-01 DIAGNOSIS — Z8041 Family history of malignant neoplasm of ovary: Secondary | ICD-10-CM | POA: Diagnosis not present

## 2019-02-01 DIAGNOSIS — Z808 Family history of malignant neoplasm of other organs or systems: Secondary | ICD-10-CM | POA: Diagnosis not present

## 2019-02-01 NOTE — Progress Notes (Signed)
[name]  [phone] [phone]  ID: Sinda Du OB: [birth year]  MR#: [id number]  KXF#:[id number]  Patient Care Team: [name], MD as PCP - General (Family Medicine) [name] Kathlene November, MD as PCP - Cardiology (Cardiology) Minna Merritts, MD as Consulting Physician (Cardiology) Vladimir Crofts, MD as Consulting Physician (Neurology) Irene Shipper, MD as Consulting Physician (Gastroenterology) Caryn Section Kirstie Peri, MD as Referring Physician (Family Medicine) Christene Lye, MD (General Surgery) Concepcion Living, MD as Referring Physician (Neurology) Anthonette Legato, MD as Consulting Physician (Nephrology) Neldon Labella, RN as Case Manager  CHIEF COMPLAINT: Iron deficiency anemia.  INTERVAL HISTORY: Patient was last evaluated in clinic nearly 2-1/2 years ago.  She is referred back for further evaluation and consideration of IV iron.  She was recently admitted to the [name] and found to have a significantly reduced hemoglobin and iron stores.  She was also noted to have heme positive stools.  She currently feels weak and fatigue, but otherwise feels well.  She has no neurologic complaints.  She denies any recent fevers.  She has a good appetite and denies weight loss.  She has no further chest pain and denies any nausea, vomiting, constipation, or diarrhea.  She denies any melena or hematochezia.  She has no urinary complaints.  Patient offers no further specific complaints today.   REVIEW OF SYSTEMS:   Review of Systems  Constitutional: Positive for malaise/fatigue. Negative for fever and weight loss.  Respiratory: Negative.  Negative for cough and shortness of breath.   Cardiovascular: Negative.  Negative for chest pain, palpitations and leg swelling.  Gastrointestinal: Negative.  Negative for blood in stool and melena.  Genitourinary: Negative.  Negative for hematuria.  Musculoskeletal: Negative.  Negative for  back pain.  Skin: Negative.  Negative for rash.  Neurological: Positive for weakness. Negative for sensory change.  Psychiatric/Behavioral: Negative.  The patient is not nervous/anxious.     As per HPI. Otherwise, a complete review of systems is negative.  PAST MEDICAL HISTORY: Past Medical History:  Diagnosis Date  . Acute pyelonephritis   . Anemia    iron deficiency anemia  . 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 Bipolar disorder ([place])   . BRCA negative 2014  . CAD (coronary artery disease)    a. 08/2003 Cath: LAD 30-40-med Rx; b. 11/2014 PCI: LAD 41m(3.25x23 Xience Alpine DES); c. 06/2015 PCI: D1 (2.25x12 Resolute Integrity DES); d. 06/2017 PCI: Patent mLAD stent, D2 95 (PTCA); e. 09/2017 PCI: D2 99ost (CBA); d. 12/2017 Cath: LM nl, LAD 355m80d (small), D1 40ost, D2 95ost, LCX 40p, RCA 40ost/p->Med rx for D2 given restenosis.  . CKD (chronic kidney disease), stage III   . Colon polyp   . CVA (cerebral vascular accident) ([place]   Left side weakness.   . Diabetes ([place]  . Family history of breast cancer    BRCA neg 2014  . Gastric ulcer 04/27/2011  . History of echocardiogram    a. 03/2017 Echo: EF 60-65%, no rwma; b. 02/2018 Echo: EF 60-65%, no rwma. Nl RV fxn. No cardiac source of emboli (admitted w/ stroke).  . [place] (hypertension)   . Hyperlipemia   . Hypothyroid   . Malignant melanoma of skin of scalp ([place]  . MI, acute, non ST segment elevation ([place]  . Neuromuscular disorder ([facility]  . Orthostatic hypotension   . S/P drug eluting coronary stent placement 06/04/2015  . Sepsis ([place]  03/18/2017  . Stroke [name])    a. 02/2018 MRI: 43m late acute/early subacute L medial frontal lobe inarct; b. 02/2018 MRA No large vessel occlusion or aneurysm. Mod to sev L P2 stenosis. thready L vertebral artery, diffusely dzs'd; c. 02/2018 Carotid U/S: <50% bilat ICA dzs.    PAST SURGICAL HISTORY: Past Surgical History:  Procedure Laterality Date  . APPENDECTOMY    . CARDIAC CATHETERIZATION N/A  11/09/2014   Procedure: Coronary Angiography;  Surgeon: TMinna Merritts MD;  [facility];  Service: Cardiovascular;  Laterality: N/A;  . CARDIAC CATHETERIZATION N/A 11/12/2014   Procedure: Coronary Stent Intervention;  Surgeon: AIsaias Cowman MD;  [place] [facility];  Service: Cardiovascular;  Laterality: N/A;  . CARDIAC CATHETERIZATION N/A 04/18/2015   Procedure: Left Heart Cath and Coronary Angiography;  Surgeon: TMinna Merritts MD;  [place] [facility];  Service: Cardiovascular;  Laterality: N/A;  . CARDIAC CATHETERIZATION Left 06/04/2015   Procedure: Left Heart Cath and Coronary Angiography;  Surgeon: MWellington Hampshire MD;  [place];  Service: Cardiovascular;  Laterality: Left;  . CARDIAC CATHETERIZATION N/A 06/04/2015   Procedure: Coronary Stent Intervention;  Surgeon: MWellington Hampshire MD;  [place] [facility];  Service: Cardiovascular;  Laterality: N/A;  . CESAREAN SECTION  2001  . CHOLECYSTECTOMY N/A 11/18/2016   Procedure: LAPAROSCOPIC CHOLECYSTECTOMY WITH INTRAOPERATIVE CHOLANGIOGRAM;  Surgeon: SChristene Lye MD;  [facility];  Service: General;  Laterality: N/A;  . COLONOSCOPY WITH PROPOFOL N/A 04/27/2016   Procedure: COLONOSCOPY WITH PROPOFOL;  Surgeon: DLucilla Lame MD;  [place] [facility]  Service: Endoscopy;  Laterality: N/A;  . COLONOSCOPY WITH PROPOFOL N/A 01/12/2018   Procedure: COLONOSCOPY WITH PROPOFOL;  Surgeon: Toledo, TBenay Pike MD;  Location: [facility];  Service: Endoscopy;  Laterality: N/A;  . CORONARY ANGIOPLASTY    . CORONARY BALLOON ANGIOPLASTY N/A 06/29/2017   Procedure: CORONARY BALLOON ANGIOPLASTY;  Surgeon: AWellington Hampshire MD;  [place];  Service: Cardiovascular;  Laterality: N/A;  . CORONARY BALLOON ANGIOPLASTY N/A 09/20/2017   Procedure: CORONARY BALLOON ANGIOPLASTY;  Surgeon: AWellington Hampshire MD;  [place];   Service: Cardiovascular;  Laterality: N/A;  . DILATION AND CURETTAGE OF UTERUS    . ESOPHAGOGASTRODUODENOSCOPY (EGD) WITH PROPOFOL N/A 09/14/2014   Procedure: ESOPHAGOGASTRODUODENOSCOPY (EGD) WITH PROPOFOL;  Surgeon: MJosefine Class MD;  [facility];  Service: Endoscopy;  Laterality: N/A;  . ESOPHAGOGASTRODUODENOSCOPY (EGD) WITH PROPOFOL N/A 04/27/2016   Procedure: ESOPHAGOGASTRODUODENOSCOPY (EGD) WITH PROPOFOL;  Surgeon: DLucilla Lame MD;  [place]  Service: Endoscopy;  Laterality: N/A;  Diabetic - oral meds  . ESOPHAGOGASTRODUODENOSCOPY (EGD) WITH PROPOFOL N/A 01/12/2018   Procedure: ESOPHAGOGASTRODUODENOSCOPY (EGD) WITH PROPOFOL;  Surgeon: Toledo, TBenay Pike MD;  Location[facility];  Service: Endoscopy;  Laterality: N/A;  . GASTRIC BYPASS  09/2009   [place] NAIL INTERTROCHANTERIC Left 10/13/2018   Procedure: INTRAMEDULLARY (IM) NAIL INTERTROCHANTRIC;  Surgeon: XLeandrew Koyanagi MD;  [place]  Service: Orthopedics;  Laterality: Left;  . Left Carotid to sublcavian artery bypass w/ subclavian artery ligation     a. Performed @ [facility]  . LEFT HEART CATH AND CORONARY ANGIOGRAPHY Left 06/29/2017   Procedure: LEFT HEART CATH AND CORONARY ANGIOGRAPHY;  Surgeon: AWellington Hampshire MD;  [place] [place] [place];  Service: Cardiovascular;  Laterality: Left;  . LEFT HEART CATH AND CORONARY ANGIOGRAPHY  N/A 09/20/2017   Procedure: LEFT HEART CATH AND CORONARY ANGIOGRAPHY;  Surgeon: Wellington Hampshire, MD;  Location: [place] [facility];  Service: Cardiovascular;  Laterality: N/A;  . LEFT HEART CATH AND CORONARY ANGIOGRAPHY N/A 12/20/2017   Procedure: LEFT HEART CATH AND CORONARY ANGIOGRAPHY;  Surgeon: Wellington Hampshire, MD;  [facility];  Service: Cardiovascular;  Laterality: N/A;  . MELANOMA EXCISION  2016   Dr. Evorn Gong  . [place]  2002  . RIGHT OOPHORECTOMY    . SHOULDER  ARTHROSCOPY WITH OPEN ROTATOR CUFF REPAIR Right 01/07/2016   Procedure: SHOULDER ARTHROSCOPY WITH DEBRIDMENT, SUBACHROMIAL DECOMPRESSION;  Surgeon: Corky Mull, MD;  Location: [facility];  Service: Orthopedics;  Laterality: Right;  . SHOULDER ARTHROSCOPY WITH OPEN ROTATOR CUFF REPAIR Right 03/16/2017   Procedure: SHOULDER ARTHROSCOPY WITH OPEN ROTATOR CUFF REPAIR POSSIBLE BICEPS TENODESIS;  Surgeon: Corky Mull, MD;  Location: [facility];  Service: Orthopedics;  Laterality: Right;  . TRIGGER FINGER RELEASE Right     [place]    FAMILY HISTORY Family History  Problem Relation Age of Onset  . Hypertension Mother   . Anxiety disorder Mother   . Depression Mother   . Bipolar disorder Mother   . Heart disease Mother        No details  . Hyperlipidemia Mother   . Kidney disease Father   . Heart disease Father 73  . Hypertension Father   . Diabetes Father   . Stroke Father   . Colon cancer Father        dx in his 78's  . Anxiety disorder Father   . Depression Father   . Skin cancer Father   . Kidney disease Sister   . Thyroid nodules Sister   . Hypertension Sister   . Hypertension Sister   . Diabetes Sister   . Hyperlipidemia Sister   . Depression Sister   . Breast cancer Maternal Aunt 62  . Breast cancer Maternal Aunt 26  . Ovarian cancer Cousin   . Colon cancer Cousin   . Breast cancer Other   . Kidney cancer Neg Hx   . Bladder Cancer Neg Hx        ADVANCED DIRECTIVES:    HEALTH MAINTENANCE: Social History   Tobacco Use  . Smoking status: Former Smoker    Types: Cigarettes    Quit date: 08/31/1994    Years since quitting: 24.4  . Smokeless tobacco: Never Used  . Tobacco comment: quit 28 years ago  Substance Use Topics  . Alcohol use: No    Alcohol/week: 0.0 standard drinks  . Drug use: No     Allergies  Allergen Reactions  . Lipitor [Atorvastatin] Other (See Comments)    Leg pains  . Tramadol Other (See Comments)    Mouth feels like it's on fire  .  Lactose Intolerance (Gi) Nausea Only    Current Outpatient Medications  Medication Sig Dispense Refill  . ALPRAZolam (XANAX) 1 MG tablet TAKE 1 TABLET BY MOUTH ONCE OR TWICE A DAY FOR ANXIETY 45 tablet 3  . amLODipine (NORVASC) 10 MG tablet Take 1 tablet (10 mg total) by mouth daily. 30 tablet 0  . aspirin EC 81 MG EC tablet Take 1 tablet (81 mg total) by mouth daily for 30 doses. 30 tablet 0  . buPROPion (WELLBUTRIN XL) 150 MG 24 hr tablet TAKE 1 TABLET BY MOUTH EVERY DAY (Patient taking differently: Take 150 mg by mouth daily. ) 90 tablet 4  .  carvedilol (COREG) 12.5 MG tablet Take 1 tablet (12.5 mg total) by mouth 2 (two) times daily with a meal. 60 tablet 0  . clopidogrel (PLAVIX) 75 MG tablet Take 1 tablet (75 mg total) by mouth daily. 90 tablet 3  . diphenoxylate-atropine (LOMOTIL) 2.5-0.025 MG tablet Take one capsule 1-4 times daily as needed for diarrhea (Patient taking differently: Take 1-4 tablets by mouth daily. Take one capsule 1-4 times daily for diarrhea) 30 tablet 3  . furosemide (LASIX) 20 MG tablet Take 1 tablet (20 mg total) by mouth 3 (three) times a week. On Monday, Wednesday and Friday 12 tablet 1  . gabapentin (NEURONTIN) 300 MG capsule Take 1 capsule (300 mg total) by mouth 2 (two) times daily. 180 capsule 4  . Galcanezumab-gnlm (EMGALITY) 120 MG/ML SOAJ INJECT 240 MG SUBCUTANEOUSLY AS DIRECTED FOR THE FIRST MONTH.    . isosorbide mononitrate (IMDUR) 60 MG 24 hr tablet Take 1 tablet (60 mg total) by mouth daily. 30 tablet 0  . levothyroxine (SYNTHROID, LEVOTHROID) 25 MCG tablet Take 1 tablet (25 mcg total) by mouth daily before breakfast. 30 tablet 12  . losartan (COZAAR) 50 MG tablet Take 1 tablet (50 mg total) by mouth daily. 30 tablet 0  . nitroGLYCERIN (NITROSTAT) 0.4 MG SL tablet Place 1 tablet (0.4 mg total) under the tongue every 5 (five) minutes as needed for chest pain. 25 tablet 2  . pantoprazole (PROTONIX) 40 MG tablet Take 1 tablet (40 mg total) by mouth 2 (two)  times daily. 60 tablet 5  . potassium chloride (KLOR-CON) 10 MEQ tablet Take 2 tablets (20 mEq total) by mouth 3 (three) times a week. On Monday, Wednesday, Friday with Lasix 24 tablet 0  . promethazine (PHENERGAN) 25 MG tablet Take 1 tablet (25 mg total) by mouth every 6 (six) hours as needed for nausea or vomiting. 30 tablet 5  . rosuvastatin (CRESTOR) 40 MG tablet Take 1 tablet (40 mg total) by mouth daily. 90 tablet 4  . sulfamethoxazole-trimethoprim (BACTRIM DS) 800-160 MG tablet Take 1 tablet by mouth 2 (two) times daily for 5 days. 10 tablet 0  . venlafaxine XR (EFFEXOR-XR) 75 MG 24 hr capsule Take 1 capsule (75 mg total) by mouth daily with breakfast. 30 capsule 5  . ferrous sulfate 325 (65 FE) MG tablet Take 1 tablet (325 mg total) by mouth daily with breakfast. (Patient not taking: Reported on 01/27/2019) 30 tablet 0   No current facility-administered medications for this visit.    OBJECTIVE: Vitals:   02/01/19 1005  BP: 104/74  Pulse: 76  Resp: 16  Temp: 99 F (37.2 C)  SpO2: 100%     Body mass index is 28.31 kg/m.    ECOG FS:0 - Asymptomatic  General: Well-developed, well-nourished, no acute distress. Eyes: Pink conjunctiva, anicteric sclera. HEENT: Normocephalic, moist mucous membranes. Lungs: No audible wheezing or coughing. Heart: Regular rate and rhythm. Abdomen: Soft, nontender, no obvious distention. Musculoskeletal: No edema, cyanosis, or clubbing. Neuro: Alert, answering all questions appropriately. Cranial nerves grossly intact. Skin: No rashes or petechiae noted. Psych: Normal affect.   LAB RESULTS:  Lab Results  Component Value Date   NA 133 (L) 01/28/2019   K 3.6 01/28/2019   CL 103 01/28/2019   CO2 20 (L) 01/28/2019   GLUCOSE 75 01/28/2019   BUN 25 (H) 01/28/2019   CREATININE 1.89 (H) 01/28/2019   CALCIUM 8.0 (L) 01/28/2019   PROT 6.5 01/27/2019   ALBUMIN 3.2 (L) 01/27/2019   AST 34 01/27/2019  ALT 28 01/27/2019   ALKPHOS 115 01/27/2019    BILITOT 0.4 01/27/2019   GFRNONAA 30 (L) 01/28/2019   GFRAA 35 (L) 01/28/2019    Lab Results  Component Value Date   WBC 10.8 (H) 01/28/2019   NEUTROABS 7.2 01/27/2019   HGB 8.4 (L) 01/28/2019   HCT 26.9 (L) 01/28/2019   MCV 66.1 (L) 01/28/2019   PLT 134 (L) 01/28/2019   Lab Results  Component Value Date   IRON 23 (L) 01/21/2019   TIBC 243 (L) 01/21/2019   IRONPCTSAT 10 (L) 01/21/2019    Lab Results  Component Value Date   FERRITIN 6 (L) 01/21/2019     STUDIES: DG Chest 2 View  Result Date: 01/27/2019 CLINICAL DATA:  48 year old female with chest pain. EXAM: CHEST - 2 VIEW COMPARISON:  Chest radiograph dated 01/19/2019. FINDINGS: Shallow inspiration. Bilateral perihilar streaky densities, likely atelectasis. Developing infiltrate is less likely. Clinical correlation is recommended. No focal consolidation, pleural effusion, or pneumothorax. Stable cardiac silhouette. Surgical clips noted in the upper mediastinum. No acute osseous pathology. There is air distention the colon. IMPRESSION: 1. No focal consolidation. 2. Bilateral perihilar streaky densities, likely atelectasis. Developing infiltrate is less likely. Electronically Signed   By: Anner Crete M.D.   On: 01/27/2019 21:17   DG Chest 2 View  Result Date: 01/19/2019 CLINICAL DATA:  Pt c/o CP since Tuesday that is pressure like in nature and some SOB. Pt states called her MD and was advised to come to the ED. Hx of CHF, stage 3 kidney failure. EXAM: CHEST - 2 VIEW COMPARISON:  10/12/2018 FINDINGS: Cardiac silhouette is normal in size. No mediastinal or hilar masses. No evidence of adenopathy. Clear lungs.  No pleural effusion or pneumothorax. Skeletal structures are intact. IMPRESSION: No active cardiopulmonary disease. Electronically Signed   By: Lajean Manes M.D.   On: 01/19/2019 15:50   CT Angio Chest PE W and/or Wo Contrast  Result Date: 01/19/2019 CLINICAL DATA:  Shortness of breath EXAM: CT ANGIOGRAPHY CHEST  WITH CONTRAST TECHNIQUE: Multidetector CT imaging of the chest was performed using the standard protocol during bolus administration of intravenous contrast. Multiplanar CT image reconstructions and MIPs were obtained to evaluate the vascular anatomy. CONTRAST:  31m OMNIPAQUE IOHEXOL 350 MG/ML SOLN COMPARISON:  05/31/2015 FINDINGS: Cardiovascular: Contrast injection is sufficient to demonstrate satisfactory opacification of the pulmonary arteries to the segmental level. There is no pulmonary embolus. The main pulmonary artery is within normal limits for size. There is no CT evidence of acute right heart strain. There are mild atherosclerotic changes of the visualized thoracic aorta. 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 There is a bypass graft from the left common carotid artery to the left subclavian artery. The visualized portions of the left subclavian artery are grossly patent beyond the level of the anastomosis. The appearance of this finding is similar to prior CT. Heart size is mildly enlarged. Coronary artery calcifications are noted. There is no significant pericardial effusion. Mediastinum/Nodes: --No mediastinal or hilar lymphadenopathy. --No axillary lymphadenopathy. --No supraclavicular lymphadenopathy. --the left hemi thyroid appears to be either surgically absent or atretic. --The esophagus is unremarkable Lungs/Pleura: No pulmonary nodules or masses. No pleural effusion or pneumothorax. No focal airspace consolidation. No focal pleural abnormality. Upper Abdomen: The patient appears to be status post prior gastric bypass. The patient is status post prior cholecystectomy. The upper abdomen is otherwise unremarkable. Musculoskeletal: No chest wall abnormality. No acute or significant  osseous findings. Review of the MIP images confirms the above findings. IMPRESSION: 1. No evidence for pulmonary embolus or other acute intrathoracic process.  2. 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 There is a bypass graft from the left common carotid artery to the left subclavian artery. The visualized portions of the left subclavian artery are grossly patent beyond the level of the anastomosis. Overall, this is stable since 2017. Aortic Atherosclerosis (ICD10-I70.0). Electronically Signed   By: Constance Holster M.D.   On: 01/19/2019 19:13   ECHOCARDIOGRAM COMPLETE  Result Date: 01/28/2019   ECHOCARDIOGRAM REPORT   Patient Name:   [name] Date of Exam: 01/28/2019 Medical Rec #:  [id number]             Height:       62.0 in Accession #:    [id number]            Weight:       152.0 lb Date of Birth:  1970-02-16             BSA:          1.70 m Patient Age:    9 years              BP:           135/80 mmHg Patient Gender: F                     HR:           85 bpm. Exam Location:  [facility] Procedure: 2D Echo, Color Doppler and Cardiac Doppler Indications:     R06.00 Dyspnea  History:         Patient has prior history of Echocardiogram examinations, most                  recent 02/18/2018. CAD, Stroke and CKD; Risk                  Factors:Hypertension, Dyslipidemia and Diabetes.  Sonographer:     Charmayne Sheer RDCS (AE) Referring Phys:  [place] Phys: Ida Rogue MD IMPRESSIONS  1. Left ventricular ejection fraction, by visual estimation, is 60 to 65%. The left ventricle has normal function. There is no left ventricular hypertrophy.  2. Left ventricular diastolic parameters are consistent with Grade I diastolic dysfunction (impaired relaxation).  3. The left ventricle has no regional wall motion abnormalities.  4. Global right ventricle has normal systolic function.The right ventricular size is normal. No increase in right ventricular wall thickness.  5. Left atrial size was normal.  6. TR signal is inadequate for assessing pulmonary artery systolic pressure. FINDINGS  Left  Ventricle: Left ventricular ejection fraction, by visual estimation, is 60 to 65%. The left ventricle has normal function. The left ventricle has no regional wall motion abnormalities. There is no left ventricular hypertrophy. Left ventricular diastolic parameters are consistent with Grade I diastolic dysfunction (impaired relaxation). Normal left atrial pressure. Right Ventricle: The right ventricular size is normal. No increase in right ventricular wall thickness. Global RV systolic function is has normal systolic function. Left Atrium: Left atrial size was normal in size. Right Atrium: Right atrial size was normal in size Pericardium: There is no evidence of pericardial effusion. Mitral Valve: The mitral valve is normal in structure. No evidence of mitral valve regurgitation. No evidence of mitral valve stenosis by observation. MV peak gradient, 5.5 mmHg. Tricuspid Valve:  The tricuspid valve is normal in structure. Tricuspid valve regurgitation is not demonstrated. Aortic Valve: The aortic valve is normal in structure. Aortic valve regurgitation is not visualized. The aortic valve is structurally normal, with no evidence of sclerosis or stenosis. Aortic valve mean gradient measures 9.0 mmHg. Aortic valve peak gradient measures 16.6 mmHg. Aortic valve area, by VTI measures 1.35 cm. Pulmonic Valve: The pulmonic valve was normal in structure. Pulmonic valve regurgitation is not visualized. Pulmonic regurgitation is not visualized. Aorta: The aortic root, ascending aorta and aortic arch are all structurally normal, with no evidence of dilitation or obstruction. Venous: The inferior vena cava is normal in size with greater than 50% respiratory variability, suggesting right atrial pressure of 3 mmHg. IAS/Shunts: No atrial level shunt detected by color flow Doppler. There is no evidence of a patent foramen ovale. No ventricular septal defect is seen or detected. There is no evidence of an atrial septal defect.  LEFT  VENTRICLE PLAX 2D LVIDd:         5.11 cm  Diastology LVIDs:         3.24 cm  LV e' lateral:   14.10 cm/s LV PW:         0.92 cm  LV E/e' lateral: 6.1 LV IVS:        0.75 cm  LV e' medial:    8.05 cm/s LVOT diam:     1.90 cm  LV E/e' medial:  10.7 LV SV:         82 ml LV SV Index:   46.76 LVOT Area:     2.84 cm  RIGHT VENTRICLE RV Basal diam:  2.75 cm LEFT ATRIUM             Index       RIGHT ATRIUM           Index LA diam:        3.80 cm 2.23 cm/m  RA Area:     12.40 cm LA Vol (A2C):   27.6 ml 16.22 ml/m RA Volume:   27.20 ml  15.99 ml/m LA Vol (A4C):   42.8 ml 25.16 ml/m LA Biplane Vol: 34.4 ml 20.22 ml/m  AORTIC VALVE                    PULMONIC VALVE AV Area (Vmax):    1.29 cm     PV Vmax:       1.41 m/s AV Area (Vmean):   1.33 cm     PV Vmean:      98.700 cm/s AV Area (VTI):     1.35 cm     PV VTI:        0.267 m AV Vmax:           204.00 cm/s  PV Peak grad:  8.0 mmHg AV Vmean:          145.000 cm/s PV Mean grad:  4.0 mmHg AV VTI:            0.335 m AV Peak Grad:      16.6 mmHg AV Mean Grad:      9.0 mmHg LVOT Vmax:         93.10 cm/s LVOT Vmean:        68.100 cm/s LVOT VTI:          0.160 m LVOT/AV VTI ratio: 0.48  AORTA Ao Root diam: 3.00 cm MITRAL VALVE MV Area (PHT): 4.60 cm  SHUNTS MV Peak grad:  5.5 mmHg              Systemic VTI:  0.16 m MV Mean grad:  3.0 mmHg              Systemic Diam: 1.90 cm MV Vmax:       1.17 m/s MV Vmean:      90.3 cm/s MV VTI:        0.24 m MV PHT:        47.85 msec MV Decel Time: 165 msec MV E velocity: 85.90 cm/s  103 cm/s MV A velocity: 104.00 cm/s 70.3 cm/s MV E/A ratio:  0.83        1.5  Ida Rogue MD Electronically signed by Ida Rogue MD Signature Date/Time: 01/28/2019/3:48:37 PM    Final    XR FEMUR MIN 2 VIEWS LEFT  Result Date: 01/10/2019 Healed intertrochanteric fracture   ASSESSMENT: Iron deficiency anemia.  PLAN:    1. Iron deficiency anemia: Although patient noted to have heme positive stools and has an appointment with GI  on March 15, 2019.  She also likely has poor absorption given her history of gastric bypass surgery.  Patient's hemoglobin has significantly reduced to 8.4.  Her iron stores are also decreased.  Previously, entire anemia work-up was either negative or within normal limits.  Patient reports she received IV iron during her recent admission.  Return to clinic in 1 and 2 weeks for 510 mg IV Feraheme.  Patient will then return to clinic in 2 months with repeat laboratory work, further evaluation, and consideration of additional treatment.   2.  Heme positive stools: Patient was instructed to keep her appointment with [facility] on March 15, 2019. 3.  Chronic renal insufficiency: Patient's most recent creatinine was 1.89.    I spent a total of 45 minutes face-to-face with the patient and reviewing chart data of which greater than 50% of the visit was spent in counseling and coordination of care as detailed above.   Patient expressed understanding and was in agreement with this plan. She also understands that She can call clinic at any time with any questions, concerns, or complaints.   Kendra Huger, MD   02/01/2019 10:34 AM

## 2019-02-01 NOTE — Telephone Encounter (Signed)
She can have the 9am slot on 02-05-2018.  (The current patient in that slot is a fake patient I used to hold that slot open)

## 2019-02-01 NOTE — Telephone Encounter (Signed)
Incoming call returned from patient. Communicated note from Dr. [name]. She reports that she has appt today with hem/onc. Confirmed appt for our office 1/5.  Advised pt to call for any further questions or concerns.

## 2019-02-01 NOTE — Progress Notes (Signed)
Former patient back in clinic as new patient reports been over a year since last seen.  Pt reports was hospitalized Dec 17,2020 MD thinks anemia is causing my problems.  Pt also stated tested positive for occult blood in stool samples x 3.

## 2019-02-01 NOTE — Telephone Encounter (Signed)
Call  Attempted. No answer, no VM available.

## 2019-02-01 NOTE — Telephone Encounter (Signed)
Patient scheduled for 02/06/2019 at 9am

## 2019-02-02 LAB — CULTURE, URINE COMPREHENSIVE

## 2019-02-02 MED ORDER — TRAZODONE HCL 100 MG PO TABS: 100.0000 mg | ORAL_TABLET | Freq: Every day | ORAL | 6 refills | Status: DC

## 2019-02-02 NOTE — Addendum Note (Signed)
Addended by: [name] E on: 02/02/2019 10:20 AM   Modules accepted: Orders

## 2019-02-04 NOTE — Progress Notes (Signed)
Office Visit    Patient Name: [name] Date of Encounter: 02/07/2019  Primary Care Provider:  [name], MD Primary Cardiologist:  Ida Rogue, MD  Chief Complaint    49 year old female with history of CAD s/p prior interventions as detailed below, ICM with subsequent improvement in EF and most recently HFpEF with EF 60-65%, CKD stage III, prior CVAs, left carotid to subclavian artery bypass with subclavian artery ligation, aortic arch aneurysm, anemia, obesity status post gastric bypass, bipolar disorder, DM with polyneuropathy, migraine disorder, hypertension, hyperlipidemia, frequent falls/syncope/near syncope, and who is being seen today for hospital follow-up of CP and elevated BP.  Past Medical History    Past Medical History:  Diagnosis Date  . Acute pyelonephritis   . Anemia    iron deficiency anemia  . Aortic arch aneurysm ([place])   . Bipolar disorder ([place])   . BRCA negative 2014  . CAD (coronary artery disease)    a. 08/2003 Cath: LAD 30-40-med Rx; b. 11/2014 PCI: LAD 436m(3.25x23 Xience Alpine DES); c. 06/2015 PCI: D1 (2.25x12 Resolute Integrity DES); d. 06/2017 PCI: Patent mLAD stent, D2 95 (PTCA); e. 09/2017 PCI: D2 99ost (CBA); d. 12/2017 Cath: LM nl, LAD 359m80d (small), D1 40ost, D2 95ost, LCX 40p, RCA 40ost/p->Med rx for D2 given restenosis.  . CKD (chronic kidney disease), stage III   . Colon polyp   . CVA (cerebral vascular accident) ([facility]   Left side weakness.   . Diabetes ([facility]  . Family history of breast cancer    BRCA neg 2014  . Gastric ulcer 04/27/2011  . History of echocardiogram    a. 03/2017 Echo: EF 60-65%, no rwma; b. 02/2018 Echo: EF 60-65%, no rwma. Nl RV fxn. No cardiac source of emboli (admitted w/ stroke).  . [place] (hypertension)   . Hyperlipemia   . Hypothyroid   . Malignant melanoma of skin of scalp ([facility]  . MI, acute, non ST segment elevation ([facility]  . Neuromuscular disorder ([facility]  . Orthostatic hypotension   . S/P  drug eluting coronary stent placement 06/04/2015  . Sepsis ([facility]2/14/2019  . Stroke ([facility]   a. 02/2018 MRI: 36m64mate acute/early subacute L medial frontal lobe inarct; b. 02/2018 MRA No large vessel occlusion or aneurysm. Mod to sev L P2 stenosis. thready L vertebral artery, diffusely dzs'd; c. 02/2018 Carotid U/S: <50% bilat ICA dzs.   Past Surgical History:  Procedure Laterality Date  . APPENDECTOMY    . CARDIAC CATHETERIZATION N/A 11/09/2014   Procedure: Coronary Angiography;  Surgeon: TimMinna MerrittsD;  [facility];  Service: Cardiovascular;  Laterality: N/A;  . CARDIAC CATHETERIZATION N/A 11/12/2014   Procedure: Coronary Stent Intervention;  Surgeon: AleIsaias CowmanD;  [facility];  Service: Cardiovascular;  Laterality: N/A;  . CARDIAC CATHETERIZATION N/A 04/18/2015   Procedure: Left Heart Cath and Coronary Angiography;  Surgeon: TimMinna MerrittsD;  [facility];  Service: Cardiovascular;  Laterality: N/A;  . CARDIAC CATHETERIZATION Left 06/04/2015   Procedure: Left Heart Cath and Coronary Angiography;  Surgeon: MuhWellington HampshireD;  [facility];  Service: Cardiovascular;  Laterality: Left;  . CARDIAC CATHETERIZATION N/A 06/04/2015   Procedure: Coronary Stent Intervention;  Surgeon: MuhWellington HampshireD;  [facility];  Service: Cardiovascular;  Laterality: N/A;  . CESAREAN SECTION  2001  . CHOLECYSTECTOMY N/A 11/18/2016   Procedure:  LAPAROSCOPIC CHOLECYSTECTOMY WITH INTRAOPERATIVE CHOLANGIOGRAM;  Surgeon: Christene Lye, MD;  [facility];  Service: General;  Laterality: N/A;  . COLONOSCOPY WITH PROPOFOL N/A 04/27/2016   Procedure: COLONOSCOPY WITH PROPOFOL;  Surgeon: [name], MD;  Location: [place];  Service: Endoscopy;  Laterality: N/A;  . COLONOSCOPY WITH PROPOFOL N/A 01/12/2018   Procedure: COLONOSCOPY WITH PROPOFOL;  Surgeon: Toledo, Benay Pike, MD;   Location[facility];  Service: Endoscopy;  Laterality: N/A;  . CORONARY ANGIOPLASTY    . CORONARY BALLOON ANGIOPLASTY N/A 06/29/2017   Procedure: CORONARY BALLOON ANGIOPLASTY;  Surgeon: Wellington Hampshire, MD;  Location: [place] [facility];  Service: Cardiovascular;  Laterality: N/A;  . CORONARY BALLOON ANGIOPLASTY N/A 09/20/2017   Procedure: CORONARY BALLOON ANGIOPLASTY;  Surgeon: Wellington Hampshire, MD;  Location: [place] [facility];  Service: Cardiovascular;  Laterality: N/A;  . DILATION AND CURETTAGE OF UTERUS    . ESOPHAGOGASTRODUODENOSCOPY (EGD) WITH PROPOFOL N/A 09/14/2014   Procedure: ESOPHAGOGASTRODUODENOSCOPY (EGD) WITH PROPOFOL;  Surgeon: Josefine Class, MD;  [facility];  Service: Endoscopy;  Laterality: N/A;  . ESOPHAGOGASTRODUODENOSCOPY (EGD) WITH PROPOFOL N/A 04/27/2016   Procedure: ESOPHAGOGASTRODUODENOSCOPY (EGD) WITH PROPOFOL;  Surgeon: [name], MD;  Location: [place];  Service: Endoscopy;  Laterality: N/A;  Diabetic - oral meds  . ESOPHAGOGASTRODUODENOSCOPY (EGD) WITH PROPOFOL N/A 01/12/2018   Procedure: ESOPHAGOGASTRODUODENOSCOPY (EGD) WITH PROPOFOL;  Surgeon: Toledo, Benay Pike, MD;  [facility];  Service: Endoscopy;  Laterality: N/A;  . GASTRIC BYPASS  09/2009   [place] (IM) NAIL INTERTROCHANTERIC Left 10/13/2018   Procedure: INTRAMEDULLARY (IM) NAIL INTERTROCHANTRIC;  Surgeon: Leandrew Koyanagi, MD;  Location: [place];  Service: Orthopedics;  Laterality: Left;  . Left Carotid to sublcavian artery bypass w/ subclavian artery ligation     a. Performed @ [facility].  . LEFT HEART CATH AND CORONARY ANGIOGRAPHY Left 06/29/2017   Procedure: LEFT HEART CATH AND CORONARY ANGIOGRAPHY;  Surgeon: Wellington Hampshire, MD;  Location: [place] [facility];  Service: Cardiovascular;  Laterality: Left;  . LEFT HEART CATH AND CORONARY ANGIOGRAPHY N/A 09/20/2017   Procedure: LEFT HEART CATH AND CORONARY ANGIOGRAPHY;   Surgeon: Wellington Hampshire, MD;  Location: [place] [facility];  Service: Cardiovascular;  Laterality: N/A;  . LEFT HEART CATH AND CORONARY ANGIOGRAPHY N/A 12/20/2017   Procedure: LEFT HEART CATH AND CORONARY ANGIOGRAPHY;  Surgeon: Wellington Hampshire, MD;  Location: [place] [facility];  Service: Cardiovascular;  Laterality: N/A;  . MELANOMA EXCISION  2016   Dr. Evorn Gong  . [place]  2002  . RIGHT OOPHORECTOMY    . SHOULDER ARTHROSCOPY WITH OPEN ROTATOR CUFF REPAIR Right 01/07/2016   Procedure: SHOULDER ARTHROSCOPY WITH DEBRIDMENT, SUBACHROMIAL DECOMPRESSION;  Surgeon: Corky Mull, MD;  [facility];  Service: Orthopedics;  Laterality: Right;  . SHOULDER ARTHROSCOPY WITH OPEN ROTATOR CUFF REPAIR Right 03/16/2017   Procedure: SHOULDER ARTHROSCOPY WITH OPEN ROTATOR CUFF REPAIR POSSIBLE BICEPS TENODESIS;  Surgeon: Corky Mull, MD;  [facility];  Service: Orthopedics;  Laterality: Right;  . TRIGGER FINGER RELEASE Right     Middle Finger    Allergies  Allergies  Allergen Reactions  . Lipitor [Atorvastatin] Other (See Comments)    Leg pains  . Tramadol Other (See Comments)    Mouth feels like it's on fire    History of Present Illness    49 year old female with PMH as above.  She was diagnosed with CAD 08/2003  with LHC at that time showing moderate LAD disease that was medically managed.  [place] 2016 with PCI/DES placement to the mLAD.  She has not been able to tolerate escalation of medical management in the setting of relative hypotension / adverse effects of antianginal therapy. 06/2015 repeat LHC with patent LAD stent and significant D1 dz with PCI/DES. With recurrent angina 06/2017, LHC showed severe stenosis in D2 s/p PTCA. Recurrent angina 08/2017 with repeat LHC that showed restenosis of D2 treated with cutting balloon angioplasty. 12/2017 cath found 95% ostial stenosis / restenosis of D2 with significant apical LAD dz. Given recurrent restenosis of D2, medical therapy was  recommended. 01/2018 she underwent upper and lower endoscopy without significant findings. She was admitted 02/2018 with CVA and MRI showing late acute/early subacute left medial frontal lobe infarct. Echo showed normal LVSF without cardiac source of emboli. She was admitted 10/2018 after a mechanical fall, leading to displaced intertrochanteric fx of L femur s/p ORIF. She was seen 12/2018 with BB increased 2/2 palpitations / BP and prior cardiac monitoring unable to exclude AT. [place] admission 01/18/2017 with hypertensive urgency and improvement with titration of cardiac medications. ReDS vest 52 with addition of po lasix at discharge. She then seen in the ED 12/25 with report of SOB and CP. It was felt that her CP was more MSK in nature. At discharge, her lasix was changed to MWF. Future outpatient stress testing was also discussed at that time. Of note, she had elevated Cr with CKD and report of many loose bowel movements and recent heme positive stools and poor oral intake reported that admission. 12/26 echo showed EF 60-65% and no RWMA.   On 12/28 she was seen as an outpatient for recurrent UTI and found to have Klebsiella pneumoniae placed on Bactrim.  She reports finishing this antibiotic with continued but minimal UTI symptoms.  On 12/30, she was seen by Dr. Grayland Ormond for iron deficiency anemia and heme positive stools. It was noted that she likely had poor absorption 2/2 gastric bypass surgery. Recommendation was to RTC for IV infusion and follow-up with GI regarding heme positive stools.  She reports today that her appointment is upcoming.  She continues to report melena.  Today, she presents to the office for hospital follow-up and reports dizziness, shortness of breath, and fatigue.  She reports that she has had the symptoms since discharge.  She has noted that she wants to sleep all day.  She also notes intermittent chest pain episodes that are located in the substernal region and radiate to her left  shoulder.  They are severe but last only minutes, estimated at 5 minutes.  She states that these episodes are similar to those she had before her previous catheterization.  She has not had to use her sublingual nitro.  She reports improvement in her shortness of breath with Lasix, as well as her ankle edema.  However, she continues to note orthopnea, which has not improved with the Lasix.  She denies any abdominal distention.  No recent heart rate or palpitations.  She reports today that her PCP suggested she cut her Norvasc in half and take 5 mg daily given her dizziness and sx of presyncope.  She reports medication compliance.  No hemoptysis or hematuria but continued melena. Her main complaint today is of fatigue.  Home Medications    Prior to Admission medications   Medication Sig Start Date End Date Taking? Authorizing Provider  ALPRAZolam (XANAX) 1 MG tablet TAKE 1 TABLET BY  MOUTH ONCE OR TWICE A DAY FOR ANXIETY 12/09/18   [name], MD  amLODipine (NORVASC) 10 MG tablet Take 1 tablet (10 mg total) by mouth daily. 01/23/19 02/22/19  Wyvonnia Dusky, MD  aspirin EC 81 MG EC tablet Take 1 tablet (81 mg total) by mouth daily for 30 doses. 01/23/19 02/22/19  Wyvonnia Dusky, MD  buPROPion (WELLBUTRIN XL) 150 MG 24 hr tablet TAKE 1 TABLET BY MOUTH EVERY DAY Patient taking differently: Take 150 mg by mouth daily.  09/27/18   [name], MD  carvedilol (COREG) 12.5 MG tablet Take 1 tablet (12.5 mg total) by mouth 2 (two) times daily with a meal. 01/23/19 02/22/19  Wyvonnia Dusky, MD  clopidogrel (PLAVIX) 75 MG tablet Take 1 tablet (75 mg total) by mouth daily. 07/08/18   Theora Gianotti, NP  diphenoxylate-atropine (LOMOTIL) 2.5-0.025 MG tablet Take one capsule 1-4 times daily as needed for diarrhea Patient taking differently: Take 1-4 tablets by mouth daily. Take one capsule 1-4 times daily for diarrhea 01/10/19   [name], MD  ferrous sulfate 325 (65 FE) MG tablet  Take 1 tablet (325 mg total) by mouth daily with breakfast. Patient not taking: Reported on 01/27/2019 01/23/19 02/22/19  Wyvonnia Dusky, MD  furosemide (LASIX) 20 MG tablet Take 1 tablet (20 mg total) by mouth 3 (three) times a week. On Monday, Wednesday and Friday 01/30/19 03/27/19  Nicole Kindred A, DO  gabapentin (NEURONTIN) 300 MG capsule Take 1 capsule (300 mg total) by mouth 2 (two) times daily. 10/12/18   [name], MD  Galcanezumab-gnlm (EMGALITY) 120 MG/ML SOAJ INJECT 240 MG SUBCUTANEOUSLY AS DIRECTED FOR THE FIRST MONTH. 11/11/18   [provider]  isosorbide mononitrate (IMDUR) 60 MG 24 hr tablet Take 1 tablet (60 mg total) by mouth daily. 01/23/19 02/22/19  Wyvonnia Dusky, MD  levothyroxine (SYNTHROID, LEVOTHROID) 25 MCG tablet Take 1 tablet (25 mcg total) by mouth daily before breakfast. 03/28/18   [name], MD  losartan (COZAAR) 50 MG tablet Take 1 tablet (50 mg total) by mouth daily. 01/23/19 02/22/19  Wyvonnia Dusky, MD  nitroGLYCERIN (NITROSTAT) 0.4 MG SL tablet Place 1 tablet (0.4 mg total) under the tongue every 5 (five) minutes as needed for chest pain. 06/02/17   Minna Merritts, MD  pantoprazole (PROTONIX) 40 MG tablet Take 1 tablet (40 mg total) by mouth 2 (two) times daily. 03/02/18   [name], MD  potassium chloride (KLOR-CON) 10 MEQ tablet Take 2 tablets (20 mEq total) by mouth 3 (three) times a week. On Monday, Wednesday, Friday with Lasix 01/30/19 03/01/19  Ezekiel Slocumb, DO  promethazine (PHENERGAN) 25 MG tablet Take 1 tablet (25 mg total) by mouth every 6 (six) hours as needed for nausea or vomiting. 11/07/18   [name], MD  rosuvastatin (CRESTOR) 40 MG tablet Take 1 tablet (40 mg total) by mouth daily. 10/12/18   [name], MD  sulfamethoxazole-trimethoprim (BACTRIM DS) 800-160 MG tablet Take 1 tablet by mouth 2 (two) times daily for 5 days. 01/30/19 02/04/19  Trinna Post, PA-C  traZODone (DESYREL) 100 MG tablet  Take 1-2 tablets (100-200 mg total) by mouth at bedtime. 02/02/19   [name], MD  venlafaxine XR (EFFEXOR-XR) 75 MG 24 hr capsule Take 1 capsule (75 mg total) by mouth daily with breakfast. 09/27/18   [name], MD    Review of Systems    She denies  palpitations, dyspnea, pnd, n, v, syncope, edema, weight gain, or early satiety.  She reports chest pain, orthopnea, dizziness, and fatigue.  All other systems reviewed and are otherwise negative except as noted above.  Physical Exam    VS:  BP (!) 90/50 (BP Location: Left Arm, Patient Position: Sitting, Cuff Size: Normal)   Pulse 69   Ht '5\' 2"'  (1.575 m)   Wt 153 lb 8 oz (69.6 kg)   SpO2 99%   BMI 28.08 kg/m  , BMI Body mass index is 28.08 kg/m. GEN: Pale female, in no acute distress.  Noticeably fatigued on exam. HEENT: normal. Neck: Supple, no JVD, carotid bruits, or masses. Cardiac: RRR, no murmurs, rubs, or gallops. No clubbing, cyanosis, edema.  Radials/DP/PT 1+ and equal bilaterally.  Respiratory:  Respirations regular and unlabored, coarse breath sounds bilaterally. GI: Soft, nontender, nondistended, BS + x 4. MS: no deformity or atrophy. Skin: warm and dry, no rash. Neuro:  Strength and sensation are intact. Psych: Normal affect.  Accessory Clinical Findings    ECG personally reviewed by me today -NSR, 69 bpm, LVH, nonspecific ST/T changes - no acute changes from previous.  Filed Weights   02/07/19 1337  Weight: 153 lb 8 oz (69.6 kg)     Echo 01/28/2019  1. Left ventricular ejection fraction, by visual estimation, is 60 to 65%. The left ventricle has normal function. There is no left ventricular hypertrophy.  2. Left ventricular diastolic parameters are consistent with Grade I diastolic dysfunction (impaired relaxation).  3. The left ventricle has no regional wall motion abnormalities.  4. Global right ventricle has normal systolic function.The right ventricular size is normal. No increase in right  ventricular wall thickness.  5. Left atrial size was normal.  6. TR signal is inadequate for assessing pulmonary artery systolic pressure.  Cath 12/20/2017  Non-stenotic 1st Diag lesion was previously treated.  Ost 1st Diag lesion is 40% stenosed.  Mid LAD-2 lesion is 5% stenosed.  Ost 2nd Diag to 2nd Diag lesion is 95% stenosed.  Scoring balloon angioplasty balloon angioplasty was performed.  Dist LAD lesion is 80% stenosed.  Mid LAD-1 lesion is 30% stenosed.  Prox Cx lesion is 40% stenosed.  Ost RCA to Prox RCA lesion is 40% stenosed.  1.  Patent first diagonal and mid LAD stent with no significant restenosis.  Significant disease affecting the distal LAD close to the apex as well as recurrent restenosis in the ostial second diagonal which was treated in the past with angioplasty most recently in August with cutting balloon angioplasty. 2.  Normal left ventricular end-diastolic pressure.  Left ventricular angiography was not performed. --Recommendations: The patient might have anginal symptoms related to disease in the distal LAD as well as ostial second diagonal.  Unfortunately, the LAD disease is too distal to stent and the ostial diagonal has been treated twice with balloon angioplasty with recurrent restenosis.  This is not an optimal location to place a stent as it will pinch the mid LAD.  The only way to treat this with stent would be to perform bifurcation stenting including the mid LAD.  However, the mid LAD disease does not seem to be obstructive and I feel that the long-term risks outweigh the benefit. I recommend continuing medical therapy.   Recent labs 01/28/2019 Sodium 133, potassium 3.6, glucose 75, creatinine 1.89, BUN 25, total cholesterol 106, HDL 56, LDL 40, triglycerides 52, TSH 3.500, hemoglobin A1c 5.6   Assessment & Plan    CAD involving the  native coronary arteries with stable angina/dyspnea --Reports ongoing intermittent chest pain and unchanged from  previous reports.  Currently chest pain-free.  Recently seen in the hospital for chest pain in the setting of elevated BP with high-sensitivity troponin peaking at 21 and thought to be supply demand ischemia in the setting of poorly controlled blood pressure.  Given her continued symptoms with controlled to soft BP today, as well as increasing fatigue and risk factors for CAD, will plan for outpatient stress test.  Continue Imdur and amlodipine 10 mg daily for antianginal effect as well as PRN SL nitro.  Continue ASA and Plavix with recommendation to repeat CBC today given fatigue / SOB and known FOBT positive / melena (does get IV iron infusions). Will trial her off of beta-blockers to see if this improves her symptoms of fatigue and SOB. Instructed patient to closely monitor her rates and blood pressure at home and call in with any elevated heart rates or pressures off of her BB. Continue statin for risk factor modification.     Chronic diastolic heart failure with improved LVSF 45-50%  60-65%, ICM --Reports some improvement in shortness of breath with addition of Lasix.  Bibasilar crackles on exam before addition of lasix in hospital with ReDs vest 52% and today improved to 25% with patient euvolemic on clinic exam.  Will recheck renal function / electrolytes and continue current dose of Lasix / potassium pending results of labs.  Continue losartan.  As noted above, given her increasing fatigue and shortness of breath, will trial a few days off of her beta-blocker to see if this improves the symptoms.  Plan for stress test as above.  Suspect that shortness of breath multifactorial in the setting of known chronic blood loss/melena anemia with hemoglobin down to 8.2 during her admission and patient pale on exam today pending her upcoming IV iron infusions.  Anemia with chronic blood loss/melena/positive FOBT --Noticeably pale today. During her admission, FOBT positive with hemoglobin down to 8.2.  Upcoming IV  infusion per patient.  Consider anemia as contributing to the above.  Will recheck CBC given patient is on ASA and Plavix.  She denies any other signs or symptoms of bleed at this time. Further recommendations pending CBC.  HTN /orthostatic hypotension --BP soft today with orthostatics positive and patient reporting increased shortness of breath/dizziness.  As above, trial a few days off of beta-blocker to see if this improves her symptoms.  I am hesitant to decrease her amlodipine or ARB today given her h/o elevated BP and h/o stroke with upcoming infusions. Consider that her pressures may improve s/p infusions. Also considered is recent 12/28 UTI with residual sx. Encouraged patient to monitor her blood pressure and heart rate at home and call if continued  trial off of BB results in increased CP, tachycardic rate, or elevated pressures.    HLD --LDL at goal.  Continue statin.  Prior CVA -Continue DAPT with ASA and Plavix (pending CBC), as well as statin.  Recommend continued optimal BP support with current pressure soft and recommendations as above.  CKD 3 --Recheck BMET as above to monitor renal functions and electrolytes on Lasix.  During her admission, she did have hypokalemia; therefore, we will keep a close eye on her potassium.  DM2 --Glycemic control for risk factor modification.   Disposition: Obtain Stress test. Trial off BB given orthostatics today with report of fatigue and SOB. Close follow-up in 1 week. Obtain CBC, BMET, Mg with further recommendations pending labs.  Arvil Chaco, PA-C 02/07/2019, 2:13 PM

## 2019-02-06 ENCOUNTER — Other Ambulatory Visit: Payer: Self-pay

## 2019-02-06 ENCOUNTER — Ambulatory Visit (INDEPENDENT_AMBULATORY_CARE_PROVIDER_SITE_OTHER): Payer: Medicare Other | Admitting: Family Medicine

## 2019-02-06 ENCOUNTER — Encounter: Payer: Self-pay | Admitting: Family Medicine

## 2019-02-06 ENCOUNTER — Telehealth: Payer: Self-pay | Admitting: Family Medicine

## 2019-02-06 VITALS — BP 93/54 | HR 91 | Temp 98.2°F | Resp 16 | Ht 62.0 in | Wt 154.6 lb

## 2019-02-06 DIAGNOSIS — R079 Chest pain, unspecified: Secondary | ICD-10-CM | POA: Diagnosis not present

## 2019-02-06 DIAGNOSIS — I25118 Atherosclerotic heart disease of native coronary artery with other forms of angina pectoris: Secondary | ICD-10-CM | POA: Diagnosis not present

## 2019-02-06 DIAGNOSIS — G4700 Insomnia, unspecified: Secondary | ICD-10-CM

## 2019-02-06 DIAGNOSIS — D649 Anemia, unspecified: Secondary | ICD-10-CM | POA: Diagnosis not present

## 2019-02-06 DIAGNOSIS — I959 Hypotension, unspecified: Secondary | ICD-10-CM | POA: Diagnosis not present

## 2019-02-06 MED ORDER — BELSOMRA 10 MG PO TABS: 10.0000 mg | ORAL_TABLET | Freq: Every evening | ORAL | 2 refills | Status: DC | PRN

## 2019-02-06 NOTE — Progress Notes (Signed)
Patient: [name] Female    DOB: May 21, 1970   49 y.o.   MRN: [id number] Visit Date: 02/06/2019  Today's Provider: [name], MD   Chief Complaint  Patient presents with  . Follow-up   Subjective:     Follow up Hospitalization  Patient was admitted to [facility] on 01/19/2019 and discharged on 01/23/2019. And in ER on 01/27/2019 for chest pains.  She was treated for unstable angina and hypertensive urgency. Treatment for this included Routine Labs checked, EKG, Chest X-ray, Morphine for pain, echocardiogram. She was noted to have elevated d-dimer, but CTA chest did not show PE.  First echo shows EF 34-50, but improved to 60-65% at discharge. Elevated troponin was felt to be due to demand ischemia.  At original discharge, metoprolol was changed to carvedilol. Losartan was increased to 50mg  and amlodipine was increased to 10mg  and Imdur was increased . She was also anemic and started on oral iron and referred while awaiting outpatient hematology evaluation.   Serial troponins were normal at overnight ED visit of 12-25. Cardiology was consulted by phone and no further cardiac workup was recommended at that time.    She reports satisfactory compliance with treatment. She reports this condition is Improved.  She did see Dr. Grayland Ormond for iron deficiency anemia on 12-30 and is anticipating iron infusion next week.  ------------------------------------------------------------------------------------    HPI  Allergies  Allergen Reactions  . Lipitor [Atorvastatin] Other (See Comments)    Leg pains  . Tramadol Other (See Comments)    Mouth feels like it's on fire  . Lactose Intolerance (Gi) Nausea Only     Current Outpatient Medications:  .  ALPRAZolam (XANAX) 1 MG tablet, TAKE 1 TABLET BY MOUTH ONCE OR TWICE A DAY FOR ANXIETY, Disp: 45 tablet, Rfl: 3 .  amLODipine (NORVASC) 10 MG tablet, Take 1 tablet (10 mg total) by mouth daily., Disp: 30 tablet, Rfl: 0 .   aspirin EC 81 MG EC tablet, Take 1 tablet (81 mg total) by mouth daily for 30 doses., Disp: 30 tablet, Rfl: 0 .  buPROPion (WELLBUTRIN XL) 150 MG 24 hr tablet, TAKE 1 TABLET BY MOUTH EVERY DAY (Patient taking differently: Take 150 mg by mouth daily. ), Disp: 90 tablet, Rfl: 4 .  carvedilol (COREG) 12.5 MG tablet, Take 1 tablet (12.5 mg total) by mouth 2 (two) times daily with a meal., Disp: 60 tablet, Rfl: 0 .  clopidogrel (PLAVIX) 75 MG tablet, Take 1 tablet (75 mg total) by mouth daily., Disp: 90 tablet, Rfl: 3 .  diphenoxylate-atropine (LOMOTIL) 2.5-0.025 MG tablet, Take one capsule 1-4 times daily as needed for diarrhea (Patient taking differently: Take 1-4 tablets by mouth daily. Take one capsule 1-4 times daily for diarrhea), Disp: 30 tablet, Rfl: 3 .  furosemide (LASIX) 20 MG tablet, Take 1 tablet (20 mg total) by mouth 3 (three) times a week. On Monday, Wednesday and Friday, Disp: 12 tablet, Rfl: 1 .  gabapentin (NEURONTIN) 300 MG capsule, Take 1 capsule (300 mg total) by mouth 2 (two) times daily., Disp: 180 capsule, Rfl: 4 .  Galcanezumab-gnlm (EMGALITY) 120 MG/ML SOAJ, INJECT 240 MG SUBCUTANEOUSLY AS DIRECTED FOR THE FIRST MONTH., Disp: , Rfl:  .  isosorbide mononitrate (IMDUR) 60 MG 24 hr tablet, Take 1 tablet (60 mg total) by mouth daily., Disp: 30 tablet, Rfl: 0 .  levothyroxine (SYNTHROID, LEVOTHROID) 25 MCG tablet, Take 1 tablet (25 mcg total) by mouth daily before breakfast., Disp: 30 tablet,  Rfl: 12 .  losartan (COZAAR) 50 MG tablet, Take 1 tablet (50 mg total) by mouth daily., Disp: 30 tablet, Rfl: 0 .  nitroGLYCERIN (NITROSTAT) 0.4 MG SL tablet, Place 1 tablet (0.4 mg total) under the tongue every 5 (five) minutes as needed for chest pain., Disp: 25 tablet, Rfl: 2 .  pantoprazole (PROTONIX) 40 MG tablet, Take 1 tablet (40 mg total) by mouth 2 (two) times daily., Disp: 60 tablet, Rfl: 5 .  potassium chloride (KLOR-CON) 10 MEQ tablet, Take 2 tablets (20 mEq total) by mouth 3 (three)  times a week. On Monday, Wednesday, Friday with Lasix, Disp: 24 tablet, Rfl: 0 .  promethazine (PHENERGAN) 25 MG tablet, Take 1 tablet (25 mg total) by mouth every 6 (six) hours as needed for nausea or vomiting., Disp: 30 tablet, Rfl: 5 .  rosuvastatin (CRESTOR) 40 MG tablet, Take 1 tablet (40 mg total) by mouth daily., Disp: 90 tablet, Rfl: 4 .  traZODone (DESYREL) 100 MG tablet, Take 1-2 tablets (100-200 mg total) by mouth at bedtime., Disp: 30 tablet, Rfl: 6 .  venlafaxine XR (EFFEXOR-XR) 75 MG 24 hr capsule, Take 1 capsule (75 mg total) by mouth daily with breakfast., Disp: 30 capsule, Rfl: 5  Review of Systems  Constitutional: Positive for fatigue.  Eyes: Negative.   Respiratory: Positive for chest tightness and shortness of breath. Negative for cough and wheezing.   Cardiovascular: Positive for chest pain and palpitations. Negative for leg swelling.  Gastrointestinal: Positive for diarrhea. Negative for nausea and vomiting.  Endocrine: Negative.   Genitourinary: Negative.   Skin: Negative.   Allergic/Immunologic: Negative.   Neurological: Positive for dizziness, light-headedness and numbness.  Psychiatric/Behavioral: Positive for confusion and sleep disturbance.    Social History   Tobacco Use  . Smoking status: Former Smoker    Types: Cigarettes    Quit date: 08/31/1994    Years since quitting: 24.4  . Smokeless tobacco: Never Used  . Tobacco comment: quit 28 years ago  Substance Use Topics  . Alcohol use: No    Alcohol/week: 0.0 standard drinks      Objective:   BP (!) 93/54   Pulse 91   Temp 98.2 F (36.8 C) (Temporal)   Resp 16   Ht 5\' 2"  (1.575 m)   Wt 154 lb 9.6 oz (70.1 kg)   SpO2 100%   BMI 28.28 kg/m  Vitals:   02/06/19 0903  BP: (!) 93/54  Pulse: 91  Resp: 16  Temp: 98.2 F (36.8 C)  TempSrc: Temporal  SpO2: 100%  Weight: 154 lb 9.6 oz (70.1 kg)  Height: 5\' 2"  (1.575 m)  Body mass index is 28.28 kg/m.    Physical Exam   General  Appearance:    Well developed, well nourished female in no acute distress  Eyes:    PERRL, conjunctiva/corneas clear, EOM's intact       Lungs:     Clear to auscultation bilaterally, respirations unlabored  Heart:    Normal heart rate. Normal rhythm. No murmurs, rubs, or gallops.   MS:   All extremities are intact.   Neurologic:   Awake, alert, oriented x 3. No apparent focal neurological           defect.           Assessment & Plan    1. Chest pain, unspecified type None since discharge. Follow up cardiology as scheduled.   2. Insomnia, unspecified type No improvement with Trazodone. Will try Belsomra  3. Anemia,  unspecified type Follow up hematology as scheduled for iron infusions.   4. Hypotension, unspecified hypotension type Check bp as per patient instructions and adjust amlodipine dose accordingly  Other orders - Suvorexant (BELSOMRA) 10 MG TABS; Take 10 mg by mouth at bedtime as needed. (Patient not taking: Reported on 02/07/2019)  Dispense: 30 tablet; Refill: 2     [name], MD  [facility]

## 2019-02-06 NOTE — Telephone Encounter (Signed)
Pt states she went to pick up Suvorexant (BELSOMRA) 10 MG TABS and pharmacy told her that it requires authorization from PCP.  Pt would like a call back when she is allowed to pick this up.

## 2019-02-06 NOTE — Patient Instructions (Addendum)
.   Please review the attached list of medications and notify my office if there are any errors.   . Please bring all of your medications to every appointment so we can make sure that our medication list is the same as yours.    Check your blood pressure every morning. If below 100/60 then only take 5mg  of amlodipine instead of 10mg

## 2019-02-06 NOTE — Telephone Encounter (Signed)
Please advise if we should proceed with starting PA for this medication?

## 2019-02-07 ENCOUNTER — Ambulatory Visit (INDEPENDENT_AMBULATORY_CARE_PROVIDER_SITE_OTHER): Payer: Medicare Other | Admitting: Physician Assistant

## 2019-02-07 ENCOUNTER — Encounter: Payer: Self-pay | Admitting: Physician Assistant

## 2019-02-07 ENCOUNTER — Other Ambulatory Visit: Payer: Self-pay

## 2019-02-07 VITALS — BP 90/50 | HR 69 | Ht 62.0 in | Wt 153.5 lb

## 2019-02-07 DIAGNOSIS — I10 Essential (primary) hypertension: Secondary | ICD-10-CM | POA: Diagnosis not present

## 2019-02-07 DIAGNOSIS — N183 Chronic kidney disease, stage 3 unspecified: Secondary | ICD-10-CM | POA: Diagnosis not present

## 2019-02-07 DIAGNOSIS — R079 Chest pain, unspecified: Secondary | ICD-10-CM | POA: Diagnosis not present

## 2019-02-07 DIAGNOSIS — D500 Iron deficiency anemia secondary to blood loss (chronic): Secondary | ICD-10-CM

## 2019-02-07 DIAGNOSIS — E785 Hyperlipidemia, unspecified: Secondary | ICD-10-CM

## 2019-02-07 DIAGNOSIS — E1165 Type 2 diabetes mellitus with hyperglycemia: Secondary | ICD-10-CM

## 2019-02-07 DIAGNOSIS — R42 Dizziness and giddiness: Secondary | ICD-10-CM

## 2019-02-07 DIAGNOSIS — I25118 Atherosclerotic heart disease of native coronary artery with other forms of angina pectoris: Secondary | ICD-10-CM

## 2019-02-07 DIAGNOSIS — I5032 Chronic diastolic (congestive) heart failure: Secondary | ICD-10-CM

## 2019-02-07 DIAGNOSIS — K921 Melena: Secondary | ICD-10-CM

## 2019-02-07 NOTE — Patient Instructions (Signed)
Medication Instructions:  1- STOP Coreg  *If you need a refill on your cardiac medications before your next appointment, please call your pharmacy*  Lab Work: Your physician recommends that you have lab work today(CBC, BMET, Engineer, materials)  If you have labs (blood work) drawn today and your tests are completely normal, you will receive your results only by: Marland Kitchen MyChart Message (if you have MyChart) OR . A paper copy in the mail If you have any lab test that is abnormal or we need to change your treatment, we will call you to review the results.  Testing/Procedures: 1- Burt  Your caregiver has ordered a Stress Test with nuclear imaging. The purpose of this test is to evaluate the blood supply to your heart muscle. This procedure is referred to as a "Non-Invasive Stress Test." This is because other than having an IV started in your vein, nothing is inserted or "invades" your body. Cardiac stress tests are done to find areas of poor blood flow to the heart by determining the extent of coronary artery disease (CAD). Some patients exercise on a treadmill, which naturally increases the blood flow to your heart, while others who are  unable to walk on a treadmill due to physical limitations have a pharmacologic/chemical stress agent called Lexiscan . This medicine will mimic walking on a treadmill by temporarily increasing your coronary blood flow.   Please note: these test may take anywhere between 2-4 hours to complete  PLEASE REPORT TO [place] AT THE FIRST DESK WILL DIRECT YOU WHERE TO GO  Date of Procedure:_____________________________________  Arrival Time for Procedure:______________________________  Instructions regarding medication:  ____:  Hold other medications as follows:_______none_________  PLEASE NOTIFY THE OFFICE AT LEAST 24 HOURS IN ADVANCE IF YOU ARE UNABLE TO KEEP YOUR APPOINTMENT.  [phone] AND  PLEASE NOTIFY NUCLEAR MEDICINE AT [facility] AT  LEAST 24 HOURS IN ADVANCE IF YOU ARE UNABLE TO KEEP YOUR APPOINTMENT. [phone]  How to prepare for your Myoview test:  1. Do not eat or drink after midnight 2. No caffeine for 24 hours prior to test 3. No smoking 24 hours prior to test. 4. Your medication may be taken with water.  If your doctor stopped a medication because of this test, do not take that medication. 5. Ladies, please do not wear dresses.  Skirts or pants are appropriate. Please wear a short sleeve shirt. 6. No perfume, cologne or lotion. 7. Wear comfortable walking shoes. No heels!       Follow-Up: At [facility], you and your health needs are our priority.  As part of our continuing mission to provide you with exceptional heart care, we have created designated Provider Care Teams.  These Care Teams include your primary Cardiologist (physician) and Advanced Practice Providers (APPs -  Physician Assistants and Nurse Practitioners) who all work together to provide you with the care you need, when you need it.  Your next appointment:   1 week(s)  The format for your next appointment:   In Person  Provider:    You may see Ida Rogue, MD or Marrianne Mood, PA-C.

## 2019-02-08 ENCOUNTER — Inpatient Hospital Stay (HOSPITAL_BASED_OUTPATIENT_CLINIC_OR_DEPARTMENT_OTHER): Payer: Medicare Other | Admitting: Oncology

## 2019-02-08 ENCOUNTER — Other Ambulatory Visit: Payer: Self-pay

## 2019-02-08 ENCOUNTER — Inpatient Hospital Stay: Payer: Medicare Other | Attending: Oncology

## 2019-02-08 ENCOUNTER — Ambulatory Visit
Admission: RE | Admit: 2019-02-08 | Discharge: 2019-02-08 | Disposition: A | Discharge status: Home / Self Care | Payer: Medicare Other | Source: Ambulatory Visit | Attending: Physician Assistant | Admitting: Physician Assistant

## 2019-02-08 VITALS — BP 99/68 | HR 73 | Temp 97.5°F | Resp 18

## 2019-02-08 DIAGNOSIS — D508 Other iron deficiency anemias: Secondary | ICD-10-CM | POA: Insufficient documentation

## 2019-02-08 DIAGNOSIS — R5383 Other fatigue: Secondary | ICD-10-CM | POA: Insufficient documentation

## 2019-02-08 DIAGNOSIS — R531 Weakness: Secondary | ICD-10-CM | POA: Insufficient documentation

## 2019-02-08 DIAGNOSIS — R079 Chest pain, unspecified: Secondary | ICD-10-CM | POA: Diagnosis not present

## 2019-02-08 DIAGNOSIS — I951 Orthostatic hypotension: Secondary | ICD-10-CM | POA: Diagnosis not present

## 2019-02-08 DIAGNOSIS — I25118 Atherosclerotic heart disease of native coronary artery with other forms of angina pectoris: Secondary | ICD-10-CM | POA: Diagnosis not present

## 2019-02-08 LAB — CBC
Hematocrit: 29.2 % — ABNORMAL LOW (ref 34.0–46.6)
Hemoglobin: 8.8 g/dL — ABNORMAL LOW (ref 11.1–15.9)
MCH: 21.9 pg — ABNORMAL LOW (ref 26.6–33.0)
MCHC: 30.1 g/dL — ABNORMAL LOW (ref 31.5–35.7)
MCV: 73 fL — ABNORMAL LOW (ref 79–97)
Platelets: 214 10*3/uL (ref 150–450)
RBC: 4.01 x10E6/uL (ref 3.77–5.28)
RDW: 17.9 % — ABNORMAL HIGH (ref 11.7–15.4)
WBC: 7.6 10*3/uL (ref 3.4–10.8)

## 2019-02-08 LAB — BASIC METABOLIC PANEL
BUN/Creatinine Ratio: 11 (ref 9–23)
BUN: 25 mg/dL — ABNORMAL HIGH (ref 6–24)
CO2: 21 mmol/L (ref 20–29)
Calcium: 8.6 mg/dL — ABNORMAL LOW (ref 8.7–10.2)
Chloride: 105 mmol/L (ref 96–106)
Creatinine, Ser: 2.24 mg/dL — ABNORMAL HIGH (ref 0.57–1.00)
GFR calc Af Amer: 29 mL/min/{1.73_m2} — ABNORMAL LOW (ref >59)
GFR calc non Af Amer: 25 mL/min/{1.73_m2} — ABNORMAL LOW (ref >59)
Glucose: 84 mg/dL (ref 65–99)
Potassium: 4.4 mmol/L (ref 3.5–5.2)
Sodium: 141 mmol/L (ref 134–144)

## 2019-02-08 LAB — MAGNESIUM: Magnesium: 1.8 mg/dL (ref 1.6–2.3)

## 2019-02-08 MED ORDER — TECHNETIUM TC 99M TETROFOSMIN IV KIT
30.1400 | PACK | Freq: Once | INTRAVENOUS | Status: AC | PRN
  Administered 2019-02-08: 10:00:00 30.14 via INTRAVENOUS

## 2019-02-08 MED ORDER — TECHNETIUM TC 99M TETROFOSMIN IV KIT
10.4480 | PACK | Freq: Once | INTRAVENOUS | Status: AC | PRN
  Administered 2019-02-08: 09:00:00 10.448 via INTRAVENOUS

## 2019-02-08 MED ORDER — SODIUM CHLORIDE 0.9 % IV SOLN
Freq: Once | INTRAVENOUS | Status: DC
  Filled 2019-02-08: qty 250

## 2019-02-08 MED ORDER — REGADENOSON 0.4 MG/5ML IV SOLN
0.4000 mg | Freq: Once | INTRAVENOUS | Status: AC
  Administered 2019-02-08: 10:00:00 0.4 mg via INTRAVENOUS

## 2019-02-08 MED ORDER — SODIUM CHLORIDE 0.9 % IV SOLN
INTRAVENOUS | Status: DC
  Filled 2019-02-08 (×2): qty 250

## 2019-02-08 MED ORDER — SODIUM CHLORIDE 0.9 % IV SOLN
510.0000 mg | Freq: Once | INTRAVENOUS | Status: AC
  Administered 2019-02-08: 14:00:00 510 mg via INTRAVENOUS
  Filled 2019-02-08: qty 17

## 2019-02-08 NOTE — Progress Notes (Signed)
Symptom Management Consult note [facility]  [phone] Fax:([phone]) [phone]  Patient Care Team: [name], MD as PCP - General (Family Medicine) [name] Kathlene November, MD as PCP - Cardiology (Cardiology) Minna Merritts, MD as Consulting Physician (Cardiology) Vladimir Crofts, MD as Consulting Physician (Neurology) Irene Shipper, MD as Consulting Physician (Gastroenterology) Caryn Section Kirstie Peri, MD as Referring Physician (Family Medicine) Christene Lye, MD (General Surgery) Concepcion Living, MD as Referring Physician (Neurology) Anthonette Legato, MD as Consulting Physician (Nephrology) Neldon Labella, RN as Case Manager Grayland Ormond Kathlene November, MD as Consulting Physician (Oncology)   Name of the patient: [name]  [id number]  February 29, 1970   Date of visit: 02/08/2019   Diagnosis-iron deficiency anemia  Chief complaint/ Reason for visit- Hypotension  Heme/Onc history:  Oncology History   No history exists.    Interval history-patient presents to symptom management today for complaints of hypotension.  She was last evaluated by Dr. Grayland Ormond for iron deficiency anemia on 02/01/2019.  Her past medical history suggest anemia and iron deficiency due to heme positive stools and poor absorption given history of gastric bypass surgery.    She was recently hospitalized and treated for unstable angina and hypertensive urgency.  She was noted to have an elevated D-dimer but CTA chest did not show PE.  Elevated troponin was likely due to demand ischemia.  At discharge, metoprolol was changed to carvedilol, losartan was increased to 50 mg and amlodipine was increased to 10 mg.  Imdur was also increased.  PCP recommended taking half Norvasc given dizziness.  Took first dose of reduced Norvasc this morning.   She was evaluated by cardiology yesterday and reported dizziness shortness of breath and fatigue.  She was instructed to stop her  beta-blocker and to follow-up in approximately 1 week. Stress test scheduled d/t symptoms.  Has scheduled appointment for [facility] in February 2021 for heme positive stools/melena.  Today, patient states she feels dizzy and weak x several weeks but today is worse. She had her stress test completed this morning.  She was previously seen by Dr. [name] and notes a similar issue when blood pressure medicines are altered.  States last time she "blacked out".  She recalls her appointment with cardiology and has stopped her carvedilol as instructed and continued her other medications as prescribed.  States she is so weak she can barely walk around her house.  She is afraid she is going to fall.  She denies any further neurological complaints, chest pain, nausea, vomiting, constipation or diarrhea. Denies urinary concerns.    ECOG FS:1 - Symptomatic but completely ambulatory  Review of systems- Review of Systems  Constitutional: Positive for malaise/fatigue. Negative for chills, fever and weight loss.  HENT: Negative for congestion, ear pain and tinnitus.   Eyes: Negative.  Negative for blurred vision and double vision.  Respiratory: Negative.  Negative for cough, sputum production and shortness of breath.   Cardiovascular: Negative.  Negative for chest pain, palpitations and leg swelling.  Gastrointestinal: Negative.  Negative for abdominal pain, constipation, diarrhea, nausea and vomiting.  Genitourinary: Negative for dysuria, frequency and urgency.  Musculoskeletal: Negative for back pain and falls.  Skin: Negative.  Negative for rash.  Neurological: Positive for dizziness and weakness. Negative for headaches.  Endo/Heme/Allergies: Negative.  Does not bruise/bleed easily.  Psychiatric/Behavioral: Negative.  Negative for depression. The patient is not nervous/anxious and does not have insomnia.      Current treatment-  Symptom Management Consult note [facility]  [phone] Fax:([phone]) [phone]  Patient Care Team: [name], MD as PCP - General (Family Medicine) [name] Kathlene November, MD as PCP - Cardiology (Cardiology) Minna Merritts, MD as Consulting Physician (Cardiology) Vladimir Crofts, MD as Consulting Physician (Neurology) Irene Shipper, MD as Consulting Physician (Gastroenterology) Caryn Section Kirstie Peri, MD as Referring Physician (Family Medicine) Christene Lye, MD (General Surgery) Concepcion Living, MD as Referring Physician (Neurology) Anthonette Legato, MD as Consulting Physician (Nephrology) Neldon Labella, RN as Case Manager Grayland Ormond Kathlene November, MD as Consulting Physician (Oncology)   Name of the patient: [name]  [id number]  February 29, 1970   Date of visit: 02/08/2019   Diagnosis-iron deficiency anemia  Chief complaint/ Reason for visit- Hypotension  Heme/Onc history:  Oncology History   No history exists.    Interval history-patient presents to symptom management today for complaints of hypotension.  She was last evaluated by Dr. Grayland Ormond for iron deficiency anemia on 02/01/2019.  Her past medical history suggest anemia and iron deficiency due to heme positive stools and poor absorption given history of gastric bypass surgery.    She was recently hospitalized and treated for unstable angina and hypertensive urgency.  She was noted to have an elevated D-dimer but CTA chest did not show PE.  Elevated troponin was likely due to demand ischemia.  At discharge, metoprolol was changed to carvedilol, losartan was increased to 50 mg and amlodipine was increased to 10 mg.  Imdur was also increased.  PCP recommended taking half Norvasc given dizziness.  Took first dose of reduced Norvasc this morning.   She was evaluated by cardiology yesterday and reported dizziness shortness of breath and fatigue.  She was instructed to stop her  beta-blocker and to follow-up in approximately 1 week. Stress test scheduled d/t symptoms.  Has scheduled appointment for [facility] in February 2021 for heme positive stools/melena.  Today, patient states she feels dizzy and weak x several weeks but today is worse. She had her stress test completed this morning.  She was previously seen by Dr. [name] and notes a similar issue when blood pressure medicines are altered.  States last time she "blacked out".  She recalls her appointment with cardiology and has stopped her carvedilol as instructed and continued her other medications as prescribed.  States she is so weak she can barely walk around her house.  She is afraid she is going to fall.  She denies any further neurological complaints, chest pain, nausea, vomiting, constipation or diarrhea. Denies urinary concerns.    ECOG FS:1 - Symptomatic but completely ambulatory  Review of systems- Review of Systems  Constitutional: Positive for malaise/fatigue. Negative for chills, fever and weight loss.  HENT: Negative for congestion, ear pain and tinnitus.   Eyes: Negative.  Negative for blurred vision and double vision.  Respiratory: Negative.  Negative for cough, sputum production and shortness of breath.   Cardiovascular: Negative.  Negative for chest pain, palpitations and leg swelling.  Gastrointestinal: Negative.  Negative for abdominal pain, constipation, diarrhea, nausea and vomiting.  Genitourinary: Negative for dysuria, frequency and urgency.  Musculoskeletal: Negative for back pain and falls.  Skin: Negative.  Negative for rash.  Neurological: Positive for dizziness and weakness. Negative for headaches.  Endo/Heme/Allergies: Negative.  Does not bruise/bleed easily.  Psychiatric/Behavioral: Negative.  Negative for depression. The patient is not nervous/anxious and does not have insomnia.      Current treatment-  be either surgically absent or atretic. --The esophagus is unremarkable Lungs/Pleura: No pulmonary nodules or masses. No pleural effusion or pneumothorax. No focal airspace consolidation. No focal pleural abnormality. Upper Abdomen: The patient appears to be status post prior gastric bypass. The patient is status post prior cholecystectomy. The upper abdomen is otherwise unremarkable. Musculoskeletal: No chest wall abnormality. No acute or significant osseous findings. Review of the MIP images confirms the above findings. IMPRESSION: 1. No evidence for pulmonary embolus or other acute intrathoracic process. 2. 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 There is a bypass graft from the left common carotid artery to the left subclavian artery. The visualized portions of the left subclavian artery are grossly  patent beyond the level of the anastomosis. Overall, this is stable since 2017. Aortic Atherosclerosis (ICD10-I70.0). Electronically Signed   By: Constance Holster M.D.   On: 01/19/2019 19:13   ECHOCARDIOGRAM COMPLETE  Result Date: 01/28/2019   ECHOCARDIOGRAM REPORT   Patient Name:   Laporche MARIE ST [facility] Date of Exam: 01/28/2019 Medical Rec #:  [id number]             Height:       62.0 in Accession #:    [id number]            Weight:       152.0 lb Date of Birth:  01/30/1971             BSA:          1.70 m Patient Age:    51 years              BP:           135/80 mmHg Patient Gender: F                     HR:           85 bpm. Exam Location:  [facility] Procedure: 2D Echo, Color Doppler and Cardiac Doppler Indications:     R06.00 Dyspnea  History:         Patient has prior history of Echocardiogram examinations, most                  recent 02/18/2018. CAD, Stroke and CKD; Risk                  Factors:Hypertension, Dyslipidemia and Diabetes.  Sonographer:     Charmayne Sheer RDCS (AE) Referring Phys:  [place] Phys: Ida Rogue MD IMPRESSIONS  1. Left ventricular ejection fraction, by visual estimation, is 60 to 65%. The left ventricle has normal function. There is no left ventricular hypertrophy.  2. Left ventricular diastolic parameters are consistent with Grade I diastolic dysfunction (impaired relaxation).  3. The left ventricle has no regional wall motion abnormalities.  4. Global right ventricle has normal systolic function.The right ventricular size is normal. No increase in right ventricular wall thickness.  5. Left atrial size was normal.  6. TR signal is inadequate for assessing pulmonary artery systolic pressure. FINDINGS  Left Ventricle: Left ventricular ejection fraction, by visual estimation, is 60 to 65%. The left ventricle has normal function. The left ventricle has no regional wall motion abnormalities. There is no left ventricular hypertrophy. Left ventricular diastolic  parameters are consistent with Grade I diastolic dysfunction (impaired relaxation). Normal left atrial pressure. Right Ventricle: The right ventricular size is normal. No increase in right ventricular wall thickness. Global RV  Symptom Management Consult note [facility]  Telephone:([phone] Fax:([phone]) [phone]  Patient Care Team: [name], MD as PCP - General (Family Medicine) [name] Kathlene November, MD as PCP - Cardiology (Cardiology) Minna Merritts, MD as Consulting Physician (Cardiology) Vladimir Crofts, MD as Consulting Physician (Neurology) Irene Shipper, MD as Consulting Physician (Gastroenterology) Caryn Section Kirstie Peri, MD as Referring Physician (Family Medicine) Christene Lye, MD (General Surgery) Concepcion Living, MD as Referring Physician (Neurology) Anthonette Legato, MD as Consulting Physician (Nephrology) Neldon Labella, RN as Case Manager Grayland Ormond Kathlene November, MD as Consulting Physician (Oncology)   Name of the patient: [name]  [id number]  February 29, 1970   Date of visit: 02/08/2019   Diagnosis-iron deficiency anemia  Chief complaint/ Reason for visit- Hypotension  Heme/Onc history:  Oncology History   No history exists.    Interval history-patient presents to symptom management today for complaints of hypotension.  She was last evaluated by Dr. Grayland Ormond for iron deficiency anemia on 02/01/2019.  Her past medical history suggest anemia and iron deficiency due to heme positive stools and poor absorption given history of gastric bypass surgery.    She was recently hospitalized and treated for unstable angina and hypertensive urgency.  She was noted to have an elevated D-dimer but CTA chest did not show PE.  Elevated troponin was likely due to demand ischemia.  At discharge, metoprolol was changed to carvedilol, losartan was increased to 50 mg and amlodipine was increased to 10 mg.  Imdur was also increased.  PCP recommended taking half Norvasc given dizziness.  Took first dose of reduced Norvasc this morning.   She was evaluated by cardiology yesterday and reported dizziness shortness of breath and fatigue.  She was instructed to stop her  beta-blocker and to follow-up in approximately 1 week. Stress test scheduled d/t symptoms.  Has scheduled appointment for [facility] in February 2021 for heme positive stools/melena.  Today, patient states she feels dizzy and weak x several weeks but today is worse. She had her stress test completed this morning.  She was previously seen by Dr. [name] and notes a similar issue when blood pressure medicines are altered.  States last time she "blacked out".  She recalls her appointment with cardiology and has stopped her carvedilol as instructed and continued her other medications as prescribed.  States she is so weak she can barely walk around her house.  She is afraid she is going to fall.  She denies any further neurological complaints, chest pain, nausea, vomiting, constipation or diarrhea. Denies urinary concerns.    ECOG FS:1 - Symptomatic but completely ambulatory  Review of systems- Review of Systems  Constitutional: Positive for malaise/fatigue. Negative for chills, fever and weight loss.  HENT: Negative for congestion, ear pain and tinnitus.   Eyes: Negative.  Negative for blurred vision and double vision.  Respiratory: Negative.  Negative for cough, sputum production and shortness of breath.   Cardiovascular: Negative.  Negative for chest pain, palpitations and leg swelling.  Gastrointestinal: Negative.  Negative for abdominal pain, constipation, diarrhea, nausea and vomiting.  Genitourinary: Negative for dysuria, frequency and urgency.  Musculoskeletal: Negative for back pain and falls.  Skin: Negative.  Negative for rash.  Neurological: Positive for dizziness and weakness. Negative for headaches.  Endo/Heme/Allergies: Negative.  Does not bruise/bleed easily.  Psychiatric/Behavioral: Negative.  Negative for depression. The patient is not nervous/anxious and does not have insomnia.      Current treatment-  Symptom Management Consult note [facility]  Telephone:([phone]) [phone] Fax:([phone]) [phone]  Patient Care Team: [name], MD as PCP - General (Family Medicine) [name] Kathlene November, MD as PCP - Cardiology (Cardiology) Minna Merritts, MD as Consulting Physician (Cardiology) Vladimir Crofts, MD as Consulting Physician (Neurology) Irene Shipper, MD as Consulting Physician (Gastroenterology) Caryn Section Kirstie Peri, MD as Referring Physician (Family Medicine) Christene Lye, MD (General Surgery) Concepcion Living, MD as Referring Physician (Neurology) Anthonette Legato, MD as Consulting Physician (Nephrology) Neldon Labella, RN as Case Manager Grayland Ormond Kathlene November, MD as Consulting Physician (Oncology)   Name of the patient: [name]  [id number]  February 29, 1970   Date of visit: 02/08/2019   Diagnosis-iron deficiency anemia  Chief complaint/ Reason for visit- Hypotension  Heme/Onc history:  Oncology History   No history exists.    Interval history-patient presents to symptom management today for complaints of hypotension.  She was last evaluated by Dr. Grayland Ormond for iron deficiency anemia on 02/01/2019.  Her past medical history suggest anemia and iron deficiency due to heme positive stools and poor absorption given history of gastric bypass surgery.    She was recently hospitalized and treated for unstable angina and hypertensive urgency.  She was noted to have an elevated D-dimer but CTA chest did not show PE.  Elevated troponin was likely due to demand ischemia.  At discharge, metoprolol was changed to carvedilol, losartan was increased to 50 mg and amlodipine was increased to 10 mg.  Imdur was also increased.  PCP recommended taking half Norvasc given dizziness.  Took first dose of reduced Norvasc this morning.   She was evaluated by cardiology yesterday and reported dizziness shortness of breath and fatigue.  She was instructed to stop her  beta-blocker and to follow-up in approximately 1 week. Stress test scheduled d/t symptoms.  Has scheduled appointment for [facility] in February 2021 for heme positive stools/melena.  Today, patient states she feels dizzy and weak x several weeks but today is worse. She had her stress test completed this morning.  She was previously seen by Dr. [name] and notes a similar issue when blood pressure medicines are altered.  States last time she "blacked out".  She recalls her appointment with cardiology and has stopped her carvedilol as instructed and continued her other medications as prescribed.  States she is so weak she can barely walk around her house.  She is afraid she is going to fall.  She denies any further neurological complaints, chest pain, nausea, vomiting, constipation or diarrhea. Denies urinary concerns.    ECOG FS:1 - Symptomatic but completely ambulatory  Review of systems- Review of Systems  Constitutional: Positive for malaise/fatigue. Negative for chills, fever and weight loss.  HENT: Negative for congestion, ear pain and tinnitus.   Eyes: Negative.  Negative for blurred vision and double vision.  Respiratory: Negative.  Negative for cough, sputum production and shortness of breath.   Cardiovascular: Negative.  Negative for chest pain, palpitations and leg swelling.  Gastrointestinal: Negative.  Negative for abdominal pain, constipation, diarrhea, nausea and vomiting.  Genitourinary: Negative for dysuria, frequency and urgency.  Musculoskeletal: Negative for back pain and falls.  Skin: Negative.  Negative for rash.  Neurological: Positive for dizziness and weakness. Negative for headaches.  Endo/Heme/Allergies: Negative.  Does not bruise/bleed easily.  Psychiatric/Behavioral: Negative.  Negative for depression. The patient is not nervous/anxious and does not have insomnia.      Current treatment-  Symptom Management Consult note [facility]  Telephone:([phone]) [phone] Fax:([phone]) [phone]  Patient Care Team: [name], MD as PCP - General (Family Medicine) [name] Kathlene November, MD as PCP - Cardiology (Cardiology) Minna Merritts, MD as Consulting Physician (Cardiology) Vladimir Crofts, MD as Consulting Physician (Neurology) Irene Shipper, MD as Consulting Physician (Gastroenterology) Caryn Section Kirstie Peri, MD as Referring Physician (Family Medicine) Christene Lye, MD (General Surgery) Concepcion Living, MD as Referring Physician (Neurology) Anthonette Legato, MD as Consulting Physician (Nephrology) Neldon Labella, RN as Case Manager Grayland Ormond Kathlene November, MD as Consulting Physician (Oncology)   Name of the patient: [name]  [id number]  February 29, 1970   Date of visit: 02/08/2019   Diagnosis-iron deficiency anemia  Chief complaint/ Reason for visit- Hypotension  Heme/Onc history:  Oncology History   No history exists.    Interval history-patient presents to symptom management today for complaints of hypotension.  She was last evaluated by Dr. Grayland Ormond for iron deficiency anemia on 02/01/2019.  Her past medical history suggest anemia and iron deficiency due to heme positive stools and poor absorption given history of gastric bypass surgery.    She was recently hospitalized and treated for unstable angina and hypertensive urgency.  She was noted to have an elevated D-dimer but CTA chest did not show PE.  Elevated troponin was likely due to demand ischemia.  At discharge, metoprolol was changed to carvedilol, losartan was increased to 50 mg and amlodipine was increased to 10 mg.  Imdur was also increased.  PCP recommended taking half Norvasc given dizziness.  Took first dose of reduced Norvasc this morning.   She was evaluated by cardiology yesterday and reported dizziness shortness of breath and fatigue.  She was instructed to stop her  beta-blocker and to follow-up in approximately 1 week. Stress test scheduled d/t symptoms.  Has scheduled appointment for [facility] in February 2021 for heme positive stools/melena.  Today, patient states she feels dizzy and weak x several weeks but today is worse. She had her stress test completed this morning.  She was previously seen by Dr. [name] and notes a similar issue when blood pressure medicines are altered.  States last time she "blacked out".  She recalls her appointment with cardiology and has stopped her carvedilol as instructed and continued her other medications as prescribed.  States she is so weak she can barely walk around her house.  She is afraid she is going to fall.  She denies any further neurological complaints, chest pain, nausea, vomiting, constipation or diarrhea. Denies urinary concerns.    ECOG FS:1 - Symptomatic but completely ambulatory  Review of systems- Review of Systems  Constitutional: Positive for malaise/fatigue. Negative for chills, fever and weight loss.  HENT: Negative for congestion, ear pain and tinnitus.   Eyes: Negative.  Negative for blurred vision and double vision.  Respiratory: Negative.  Negative for cough, sputum production and shortness of breath.   Cardiovascular: Negative.  Negative for chest pain, palpitations and leg swelling.  Gastrointestinal: Negative.  Negative for abdominal pain, constipation, diarrhea, nausea and vomiting.  Genitourinary: Negative for dysuria, frequency and urgency.  Musculoskeletal: Negative for back pain and falls.  Skin: Negative.  Negative for rash.  Neurological: Positive for dizziness and weakness. Negative for headaches.  Endo/Heme/Allergies: Negative.  Does not bruise/bleed easily.  Psychiatric/Behavioral: Negative.  Negative for depression. The patient is not nervous/anxious and does not have insomnia.      Current treatment-  Symptom Management Consult note [facility]  Telephone:([phone]) [phone] Fax:([phone]) [phone]  Patient Care Team: [name], MD as PCP - General (Family Medicine) [name] Kathlene November, MD as PCP - Cardiology (Cardiology) Minna Merritts, MD as Consulting Physician (Cardiology) Vladimir Crofts, MD as Consulting Physician (Neurology) Irene Shipper, MD as Consulting Physician (Gastroenterology) Caryn Section Kirstie Peri, MD as Referring Physician (Family Medicine) Christene Lye, MD (General Surgery) Concepcion Living, MD as Referring Physician (Neurology) Anthonette Legato, MD as Consulting Physician (Nephrology) Neldon Labella, RN as Case Manager Grayland Ormond Kathlene November, MD as Consulting Physician (Oncology)   Name of the patient: [name]  [id number]  February 29, 1970   Date of visit: 02/08/2019   Diagnosis-iron deficiency anemia  Chief complaint/ Reason for visit- Hypotension  Heme/Onc history:  Oncology History   No history exists.    Interval history-patient presents to symptom management today for complaints of hypotension.  She was last evaluated by Dr. Grayland Ormond for iron deficiency anemia on 02/01/2019.  Her past medical history suggest anemia and iron deficiency due to heme positive stools and poor absorption given history of gastric bypass surgery.    She was recently hospitalized and treated for unstable angina and hypertensive urgency.  She was noted to have an elevated D-dimer but CTA chest did not show PE.  Elevated troponin was likely due to demand ischemia.  At discharge, metoprolol was changed to carvedilol, losartan was increased to 50 mg and amlodipine was increased to 10 mg.  Imdur was also increased.  PCP recommended taking half Norvasc given dizziness.  Took first dose of reduced Norvasc this morning.   She was evaluated by cardiology yesterday and reported dizziness shortness of breath and fatigue.  She was instructed to stop her  beta-blocker and to follow-up in approximately 1 week. Stress test scheduled d/t symptoms.  Has scheduled appointment for [facility] in February 2021 for heme positive stools/melena.  Today, patient states she feels dizzy and weak x several weeks but today is worse. She had her stress test completed this morning.  She was previously seen by Dr. [name] and notes a similar issue when blood pressure medicines are altered.  States last time she "blacked out".  She recalls her appointment with cardiology and has stopped her carvedilol as instructed and continued her other medications as prescribed.  States she is so weak she can barely walk around her house.  She is afraid she is going to fall.  She denies any further neurological complaints, chest pain, nausea, vomiting, constipation or diarrhea. Denies urinary concerns.    ECOG FS:1 - Symptomatic but completely ambulatory  Review of systems- Review of Systems  Constitutional: Positive for malaise/fatigue. Negative for chills, fever and weight loss.  HENT: Negative for congestion, ear pain and tinnitus.   Eyes: Negative.  Negative for blurred vision and double vision.  Respiratory: Negative.  Negative for cough, sputum production and shortness of breath.   Cardiovascular: Negative.  Negative for chest pain, palpitations and leg swelling.  Gastrointestinal: Negative.  Negative for abdominal pain, constipation, diarrhea, nausea and vomiting.  Genitourinary: Negative for dysuria, frequency and urgency.  Musculoskeletal: Negative for back pain and falls.  Skin: Negative.  Negative for rash.  Neurological: Positive for dizziness and weakness. Negative for headaches.  Endo/Heme/Allergies: Negative.  Does not bruise/bleed easily.  Psychiatric/Behavioral: Negative.  Negative for depression. The patient is not nervous/anxious and does not have insomnia.      Current treatment-  be either surgically absent or atretic. --The esophagus is unremarkable Lungs/Pleura: No pulmonary nodules or masses. No pleural effusion or pneumothorax. No focal airspace consolidation. No focal pleural abnormality. Upper Abdomen: The patient appears to be status post prior gastric bypass. The patient is status post prior cholecystectomy. The upper abdomen is otherwise unremarkable. Musculoskeletal: No chest wall abnormality. No acute or significant osseous findings. Review of the MIP images confirms the above findings. IMPRESSION: 1. No evidence for pulmonary embolus or other acute intrathoracic process. 2. 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 There is a bypass graft from the left common carotid artery to the left subclavian artery. The visualized portions of the left subclavian artery are grossly  patent beyond the level of the anastomosis. Overall, this is stable since 2017. Aortic Atherosclerosis (ICD10-I70.0). Electronically Signed   By: Constance Holster M.D.   On: 01/19/2019 19:13   ECHOCARDIOGRAM COMPLETE  Result Date: 01/28/2019   ECHOCARDIOGRAM REPORT   Patient Name:   Laporche MARIE ST [facility] Date of Exam: 01/28/2019 Medical Rec #:  [id number]             Height:       62.0 in Accession #:    [id number]            Weight:       152.0 lb Date of Birth:  01/30/1971             BSA:          1.70 m Patient Age:    51 years              BP:           135/80 mmHg Patient Gender: F                     HR:           85 bpm. Exam Location:  [facility] Procedure: 2D Echo, Color Doppler and Cardiac Doppler Indications:     R06.00 Dyspnea  History:         Patient has prior history of Echocardiogram examinations, most                  recent 02/18/2018. CAD, Stroke and CKD; Risk                  Factors:Hypertension, Dyslipidemia and Diabetes.  Sonographer:     Charmayne Sheer RDCS (AE) Referring Phys:  [place] Phys: Ida Rogue MD IMPRESSIONS  1. Left ventricular ejection fraction, by visual estimation, is 60 to 65%. The left ventricle has normal function. There is no left ventricular hypertrophy.  2. Left ventricular diastolic parameters are consistent with Grade I diastolic dysfunction (impaired relaxation).  3. The left ventricle has no regional wall motion abnormalities.  4. Global right ventricle has normal systolic function.The right ventricular size is normal. No increase in right ventricular wall thickness.  5. Left atrial size was normal.  6. TR signal is inadequate for assessing pulmonary artery systolic pressure. FINDINGS  Left Ventricle: Left ventricular ejection fraction, by visual estimation, is 60 to 65%. The left ventricle has normal function. The left ventricle has no regional wall motion abnormalities. There is no left ventricular hypertrophy. Left ventricular diastolic  parameters are consistent with Grade I diastolic dysfunction (impaired relaxation). Normal left atrial pressure. Right Ventricle: The right ventricular size is normal. No increase in right ventricular wall thickness. Global RV  Symptom Management Consult note [place]  [phone] Fax:([phone]) [phone]  Patient Care Team: [name], MD as PCP - General (Family Medicine) [name] Kathlene November, MD as PCP - Cardiology (Cardiology) Minna Merritts, MD as Consulting Physician (Cardiology) Vladimir Crofts, MD as Consulting Physician (Neurology) Irene Shipper, MD as Consulting Physician (Gastroenterology) Caryn Section Kirstie Peri, MD as Referring Physician (Family Medicine) Christene Lye, MD (General Surgery) Concepcion Living, MD as Referring Physician (Neurology) Anthonette Legato, MD as Consulting Physician (Nephrology) Neldon Labella, RN as Case Manager Grayland Ormond Kathlene November, MD as Consulting Physician (Oncology)   Name of the patient: [name]  [id number]  February 29, 1970   Date of visit: 02/08/2019   Diagnosis-iron deficiency anemia  Chief complaint/ Reason for visit- Hypotension  Heme/Onc history:  Oncology History   No history exists.    Interval history-patient presents to symptom management today for complaints of hypotension.  She was last evaluated by Dr. Grayland Ormond for iron deficiency anemia on 02/01/2019.  Her past medical history suggest anemia and iron deficiency due to heme positive stools and poor absorption given history of gastric bypass surgery.    She was recently hospitalized and treated for unstable angina and hypertensive urgency.  She was noted to have an elevated D-dimer but CTA chest did not show PE.  Elevated troponin was likely due to demand ischemia.  At discharge, metoprolol was changed to carvedilol, losartan was increased to 50 mg and amlodipine was increased to 10 mg.  Imdur was also increased.  PCP recommended taking half Norvasc given dizziness.  Took first dose of reduced Norvasc this morning.   She was evaluated by cardiology yesterday and reported dizziness shortness of breath and fatigue.  She was instructed to stop her  beta-blocker and to follow-up in approximately 1 week. Stress test scheduled d/t symptoms.  Has scheduled appointment for [facility] in February 2021 for heme positive stools/melena.  Today, patient states she feels dizzy and weak x several weeks but today is worse. She had her stress test completed this morning.  She was previously seen by Dr. [name] and notes a similar issue when blood pressure medicines are altered.  States last time she "blacked out".  She recalls her appointment with cardiology and has stopped her carvedilol as instructed and continued her other medications as prescribed.  States she is so weak she can barely walk around her house.  She is afraid she is going to fall.  She denies any further neurological complaints, chest pain, nausea, vomiting, constipation or diarrhea. Denies urinary concerns.    ECOG FS:1 - Symptomatic but completely ambulatory  Review of systems- Review of Systems  Constitutional: Positive for malaise/fatigue. Negative for chills, fever and weight loss.  HENT: Negative for congestion, ear pain and tinnitus.   Eyes: Negative.  Negative for blurred vision and double vision.  Respiratory: Negative.  Negative for cough, sputum production and shortness of breath.   Cardiovascular: Negative.  Negative for chest pain, palpitations and leg swelling.  Gastrointestinal: Negative.  Negative for abdominal pain, constipation, diarrhea, nausea and vomiting.  Genitourinary: Negative for dysuria, frequency and urgency.  Musculoskeletal: Negative for back pain and falls.  Skin: Negative.  Negative for rash.  Neurological: Positive for dizziness and weakness. Negative for headaches.  Endo/Heme/Allergies: Negative.  Does not bruise/bleed easily.  Psychiatric/Behavioral: Negative.  Negative for depression. The patient is not nervous/anxious and does not have insomnia.      Current treatment-

## 2019-02-08 NOTE — Progress Notes (Signed)
[name] arrived to receive feraheme today. Patient was hypotensive and expressed feeling weak. BP while sitting was 84/52, standing 68/42 and sitting 94/72. Dr. Grayland Ormond and Sonia Baller, NP made aware. Ordered to start bolus of NS. Sonia Baller, NP spoke with her cardiologists and was advised to let patient know to hold her lasix for now. Patient is aware and verbalized understanding. At discharge her BP was 99/58.  I also informed patient to write down her vital signs daily to be able to give her cardiologist.

## 2019-02-09 ENCOUNTER — Telehealth: Payer: Self-pay

## 2019-02-09 ENCOUNTER — Other Ambulatory Visit: Payer: Self-pay | Admitting: Oncology

## 2019-02-09 LAB — NM MYOCAR MULTI W/SPECT W/WALL MOTION / EF
Estimated workload: 0 METS
Exercise duration (min): 0 min
Exercise duration (sec): 0 s
LV dias vol: 77 mL (ref 46–106)
LV sys vol: 37 mL
MPHR: 170 {beats}/min
Peak HR: 76 {beats}/min
Percent HR: 44 %
Rest HR: 63 {beats}/min
SDS: 2
SRS: 6
SSS: 5
TID: 1.1

## 2019-02-09 NOTE — Telephone Encounter (Signed)
Call to patient to review lab results and POC.   Pt agrees to d/c lasix.   No further questions or orders at this time.   Advised pt to call for any further questions or concerns.

## 2019-02-09 NOTE — Telephone Encounter (Signed)
-----   Message from Arvil Chaco, PA-C sent at 02/08/2019  5:20 PM EST ----- Please let [name] know that her kidney function shows she is on the drier side and she should stop her lasix.  Based on her vitals during her infusions today, she should continue to hold her carvedilol.   Her blood counts showed continued anemia / low hemoglobin before her infusions; however, it is reassuring that she did not have any sudden drops in this hemoglobin from that of her previous labs. She should continue her iron infusions as directed by hematology.  We will want to keep a close eye on her anemia going forward.  Her magnesium is improved and close to goal. It improved from 1.3 to 1.8 (goal is 2.0.). This is very reassuring.   If she has any questions or concerns before her follow-up with me next week, please let her know to feel free to call the office before that time.

## 2019-02-14 IMAGING — MR MR SHOULDER*R* W/O CM
5 series · 40 of 40 positions shown · non-contrast
Comparison: 05/24/2015

CLINICAL DATA: History of several falls with shoulder pain and
limited range of motion. History of prior shoulder surgery.

EXAM:
MRI OF THE RIGHT SHOULDER WITHOUT CONTRAST
TECHNIQUE: Multiplanar, multisequence MR imaging of the shoulder was performed.
No intravenous contrast was administered.

[Series 3: T2 fat-sat · axial · 4.0mm · 0.59mm/px · z∈[-3,+87]mm · 10 of 22 slices shown (1 of 3)]
[im 1/22]
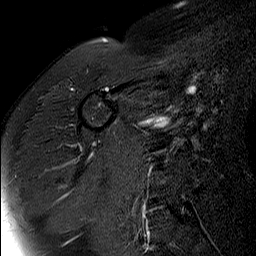
[im 3/22]
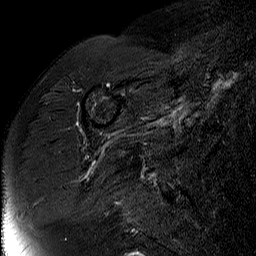
[im 5/22]
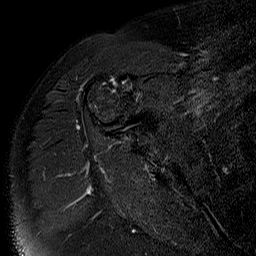
[im 8/22]
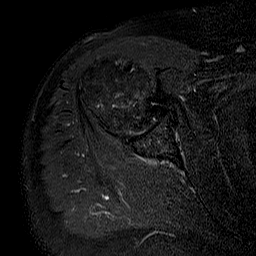
[im 10/22]
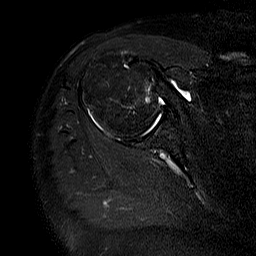
[im 12/22]
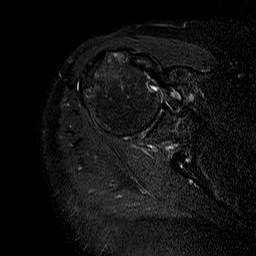
[im 15/22]
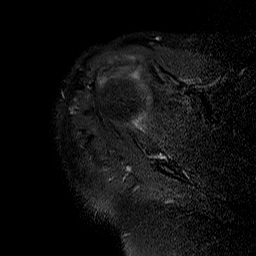
[im 17/22]
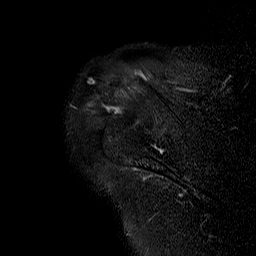
[im 19/22]
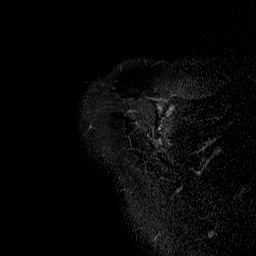
[im 22/22]
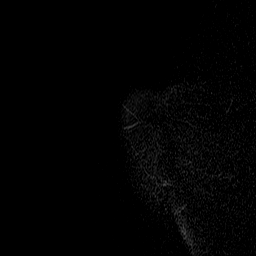

[Series 5: PD · oblique · 4.0mm · 0.59mm/px · 7 of 18 slices shown]
[im 1/18]
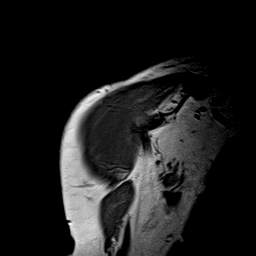
[im 3/18]
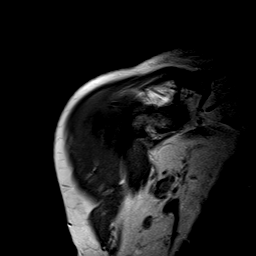
[im 6/18]
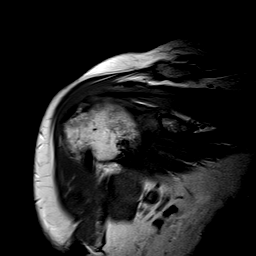
[im 9/18]
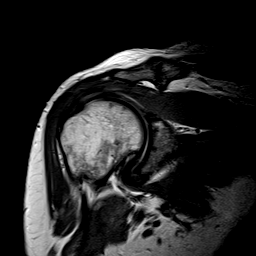
[im 12/18]
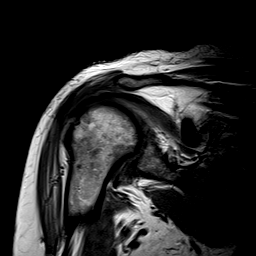
[im 15/18]
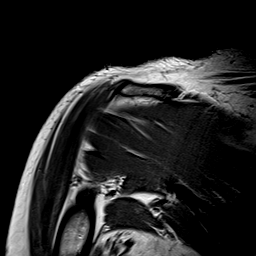
[im 18/18]
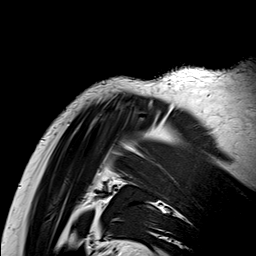

[Series 6: T1 · oblique · 4.0mm · 0.59mm/px · 8 of 20 slices shown]
[im 1/20]
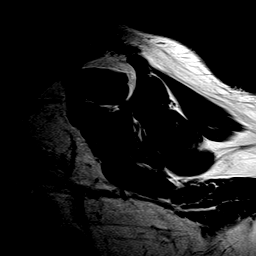
[im 3/20]
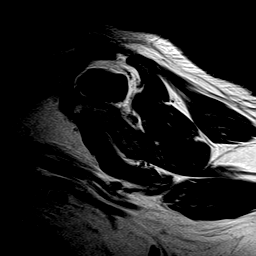
[im 6/20]
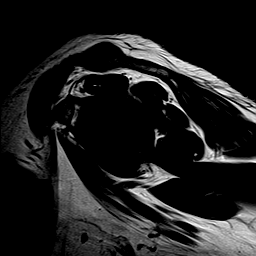
[im 9/20]
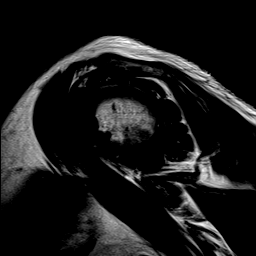
[im 11/20]
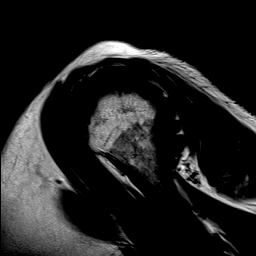
[im 14/20]
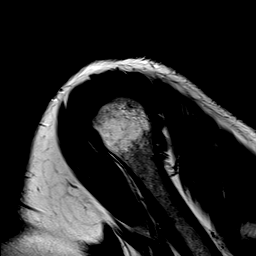
[im 17/20]
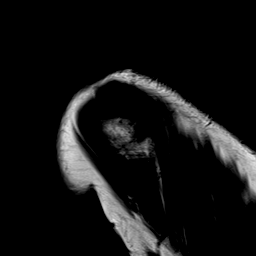
[im 20/20]
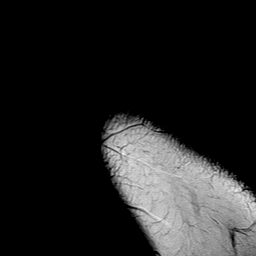

[Series 7: T2 fat-sat · oblique · 4.0mm · 0.59mm/px · 8 of 20 slices shown (2 of 3)]
[im 1/20]
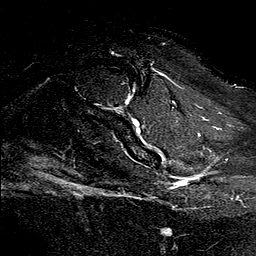
[im 3/20]
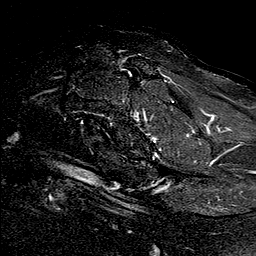
[im 6/20]
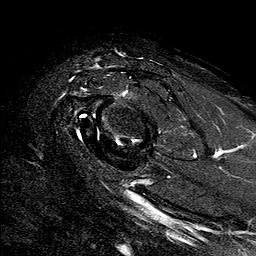
[im 9/20]
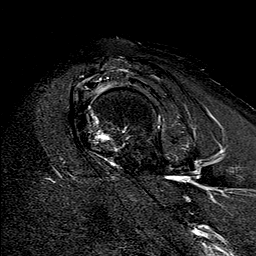
[im 11/20]
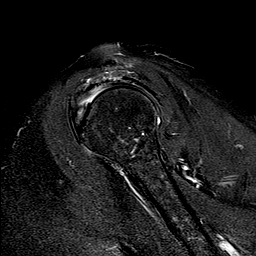
[im 14/20]
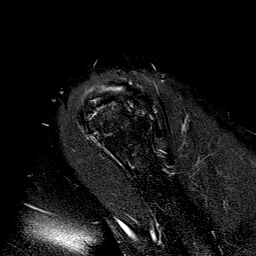
[im 17/20]
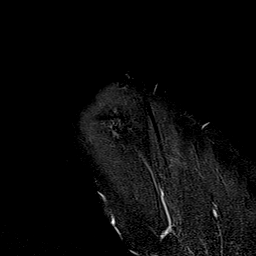
[im 20/20]
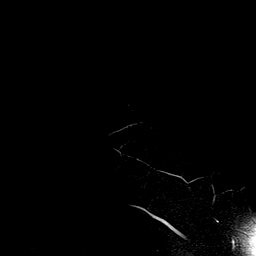

[Series 8: T2 fat-sat · oblique · 4.0mm · 0.59mm/px · 7 of 18 slices shown (3 of 3)]
[im 1/18]
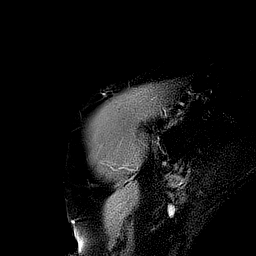
[im 3/18]
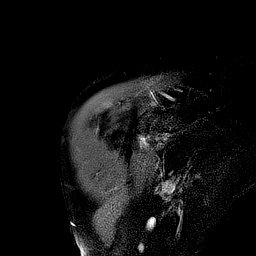
[im 6/18]
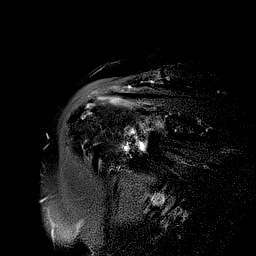
[im 9/18]
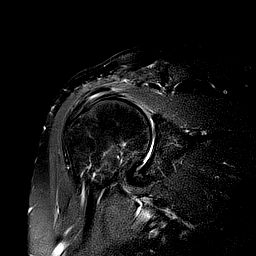
[im 12/18]
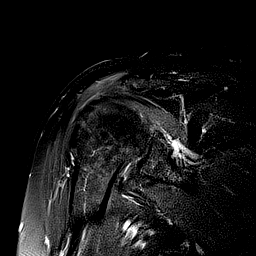
[im 15/18]
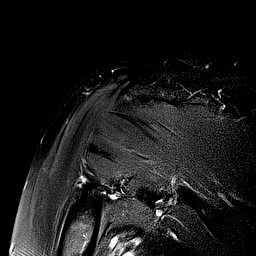
[im 18/18]
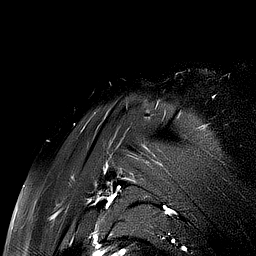

[40 of 40 positions shown; findings below may reference images not displayed]

FINDINGS: Rotator cuff: Significant supraspinatus tendinopathy/tendinosis.
Partial-thickness articular surface tearing in the critical zone
region and extending back into the muscular tendinous junction
region with approximately 10 mm of laminar retraction of the
articular fibers. The tear is fairly deep extending up 2 but not
definitely through the bursal surface. There are also interstitial
tears. Mild tendinopathy involving the subscapularis and
infraspinatus tendons.

Muscles:  Normal

Biceps long head:  Intact

Acromioclavicular Joint: Mild degenerative changes type 1-2
acromion. No lateral downsloping or undersurface spurring. Suspect
prior acromioplasty.

Glenohumeral Joint: Moderate degenerative changes with degenerative
chondrosis and early spurring. Small joint effusion. There is
thickening of the capsular structures in the axillary recess which
can be seen with adhesive capsulitis or synovitis.

Labrum:  Suspect posterior superior labral tear.

Bones:  No acute bony findings

Other: Mild subacromial/subdeltoid bursitis.
IMPRESSION: 1. Significant supraspinatus tendinopathy/tendinosis. Fairly
extensive partial-thickness articular surface tearing as described
above.
2. Suspect superior posterior labral tear.
3. Suspect prior acromioplasty.  No findings for bony impingement.
4. Glenohumeral joint degenerative changes with small joint effusion
and moderate synovitis versus adhesive capsulitis.

## 2019-02-15 ENCOUNTER — Other Ambulatory Visit: Payer: Self-pay

## 2019-02-15 ENCOUNTER — Inpatient Hospital Stay: Payer: Medicare Other

## 2019-02-15 VITALS — BP 115/71 | HR 68 | Temp 97.0°F | Resp 18

## 2019-02-15 DIAGNOSIS — D508 Other iron deficiency anemias: Secondary | ICD-10-CM

## 2019-02-15 DIAGNOSIS — R5383 Other fatigue: Secondary | ICD-10-CM | POA: Diagnosis not present

## 2019-02-15 DIAGNOSIS — R531 Weakness: Secondary | ICD-10-CM | POA: Diagnosis not present

## 2019-02-15 DIAGNOSIS — I951 Orthostatic hypotension: Secondary | ICD-10-CM | POA: Diagnosis not present

## 2019-02-15 MED ORDER — SODIUM CHLORIDE 0.9 % IV SOLN
510.0000 mg | Freq: Once | INTRAVENOUS | Status: AC
  Administered 2019-02-15: 510 mg via INTRAVENOUS
  Filled 2019-02-15: qty 17

## 2019-02-15 MED ORDER — SODIUM CHLORIDE 0.9 % IV SOLN
Freq: Once | INTRAVENOUS | Status: AC
  Filled 2019-02-15: qty 250

## 2019-02-15 NOTE — Progress Notes (Signed)
Pt tolerated infusion well. Pt declined staying full 30 minute post observation. Pt declines any concerns, complaints or questions at this time. No s/s of distress noted. Pt and VS stable at discharge.

## 2019-02-16 ENCOUNTER — Encounter: Payer: Self-pay | Admitting: Physician Assistant

## 2019-02-16 ENCOUNTER — Ambulatory Visit (INDEPENDENT_AMBULATORY_CARE_PROVIDER_SITE_OTHER): Payer: Medicare Other

## 2019-02-16 ENCOUNTER — Ambulatory Visit (INDEPENDENT_AMBULATORY_CARE_PROVIDER_SITE_OTHER): Payer: Medicare Other | Admitting: Physician Assistant

## 2019-02-16 VITALS — BP 106/71 | HR 86 | Ht 62.0 in | Wt 152.8 lb

## 2019-02-16 DIAGNOSIS — E119 Type 2 diabetes mellitus without complications: Secondary | ICD-10-CM | POA: Diagnosis not present

## 2019-02-16 DIAGNOSIS — R42 Dizziness and giddiness: Secondary | ICD-10-CM

## 2019-02-16 DIAGNOSIS — Z8673 Personal history of transient ischemic attack (TIA), and cerebral infarction without residual deficits: Secondary | ICD-10-CM | POA: Diagnosis not present

## 2019-02-16 DIAGNOSIS — I951 Orthostatic hypotension: Secondary | ICD-10-CM | POA: Diagnosis not present

## 2019-02-16 DIAGNOSIS — I5032 Chronic diastolic (congestive) heart failure: Secondary | ICD-10-CM | POA: Diagnosis not present

## 2019-02-16 DIAGNOSIS — I6529 Occlusion and stenosis of unspecified carotid artery: Secondary | ICD-10-CM

## 2019-02-16 DIAGNOSIS — E785 Hyperlipidemia, unspecified: Secondary | ICD-10-CM | POA: Diagnosis not present

## 2019-02-16 DIAGNOSIS — I25118 Atherosclerotic heart disease of native coronary artery with other forms of angina pectoris: Secondary | ICD-10-CM | POA: Diagnosis not present

## 2019-02-16 DIAGNOSIS — N183 Chronic kidney disease, stage 3 unspecified: Secondary | ICD-10-CM

## 2019-02-16 DIAGNOSIS — D500 Iron deficiency anemia secondary to blood loss (chronic): Secondary | ICD-10-CM | POA: Diagnosis not present

## 2019-02-16 NOTE — Progress Notes (Signed)
Office Visit    Patient Name: [name] Date of Encounter: 02/16/2019  Primary Care Provider:  [name], MD Primary Cardiologist:  Ida Rogue, MD  Chief Complaint    49 year old female with history of CAD s/p prior interventions as detailed below, ICM with subsequent improvement in EF and most recently HFpEF with EF 60-65%, CKD stage III, prior CVAs, left carotid to subclavian artery bypass with subclavian artery ligation, aortic arch aneurysm, anemia, obesity status post gastric bypass, bipolar disorder, DM with polyneuropathy, migraine disorder, hypertension, hyperlipidemia, frequent falls/syncope/near syncope, and who is being seen today for hospital follow-up of near syncope and orthostatic hypotension.  Past Medical History    Past Medical History:  Diagnosis Date  . Acute pyelonephritis   . Anemia    iron deficiency anemia  . Aortic arch aneurysm ([place])   . Bipolar disorder ([place])   . BRCA negative 2014  . CAD (coronary artery disease)    a. 08/2003 Cath: LAD 30-40-med Rx; b. 11/2014 PCI: LAD 101m(3.25x23 Xience Alpine DES); c. 06/2015 PCI: D1 (2.25x12 Resolute Integrity DES); d. 06/2017 PCI: Patent mLAD stent, D2 95 (PTCA); e. 09/2017 PCI: D2 99ost (CBA); d. 12/2017 Cath: LM nl, LAD 319m80d (small), D1 40ost, D2 95ost, LCX 40p, RCA 40ost/p->Med rx for D2 given restenosis.  . CKD (chronic kidney disease), stage III   . Colon polyp   . CVA (cerebral vascular accident) ([place]   Left side weakness.   . Diabetes ([facility]  . Family history of breast cancer    BRCA neg 2014  . Gastric ulcer 04/27/2011  . History of echocardiogram    a. 03/2017 Echo: EF 60-65%, no rwma; b. 02/2018 Echo: EF 60-65%, no rwma. Nl RV fxn. No cardiac source of emboli (admitted w/ stroke).  . [place] Kitchen[place] (hypertension)   . Hyperlipemia   . Hypothyroid   . Malignant melanoma of skin of scalp ([facility]  . MI, acute, non ST segment elevation ([facility]  . Neuromuscular disorder ([facility]  . Orthostatic  hypotension   . S/P drug eluting coronary stent placement 06/04/2015  . Sepsis (HCLa [place]2/14/2019  . [facility] ([facility]   a. 02/2018 MRI: 74m76mate acute/early subacute L medial frontal lobe inarct; b. 02/2018 MRA No large vessel occlusion or aneurysm. Mod to sev L P2 stenosis. thready L vertebral artery, diffusely dzs'd; c. 02/2018 Carotid U/S: <50% bilat ICA dzs.   Past Surgical History:  Procedure Laterality Date  . APPENDECTOMY    . CARDIAC CATHETERIZATION N/A 11/09/2014   Procedure: Coronary Angiography;  Surgeon: TimMinna MerrittsD;  [facility];  Service: Cardiovascular;  Laterality: N/A;  . CARDIAC CATHETERIZATION N/A 11/12/2014   Procedure: Coronary Stent Intervention;  Surgeon: AleIsaias CowmanD;  [facility];  Service: Cardiovascular;  Laterality: N/A;  . CARDIAC CATHETERIZATION N/A 04/18/2015   Procedure: Left Heart Cath and Coronary Angiography;  Surgeon: TimMinna MerrittsD;  [facility];  Service: Cardiovascular;  Laterality: N/A;  . CARDIAC CATHETERIZATION Left 06/04/2015   Procedure: Left Heart Cath and Coronary Angiography;  Surgeon: MuhWellington HampshireD;  [facility];  Service: Cardiovascular;  Laterality: Left;  . CARDIAC CATHETERIZATION N/A 06/04/2015   Procedure: Coronary Stent Intervention;  Surgeon: MuhWellington HampshireD;  [facility];  Service: Cardiovascular;  Laterality: N/A;  . CESAREAN SECTION  2001  . CHOLECYSTECTOMY N/A 11/18/2016  Procedure: LAPAROSCOPIC CHOLECYSTECTOMY WITH INTRAOPERATIVE CHOLANGIOGRAM;  Surgeon: Christene Lye, MD;  [facility];  Service: General;  Laterality: N/A;  . COLONOSCOPY WITH PROPOFOL N/A 04/27/2016   Procedure: COLONOSCOPY WITH PROPOFOL;  Surgeon: [name], MD;  Location: [place];  Service: Endoscopy;  Laterality: N/A;  . COLONOSCOPY WITH PROPOFOL N/A 01/12/2018   Procedure: COLONOSCOPY WITH PROPOFOL;  Surgeon: [place],  Benay Pike, MD;  Location: [facility];  Service: Endoscopy;  Laterality: N/A;  . CORONARY ANGIOPLASTY    . CORONARY BALLOON ANGIOPLASTY N/A 06/29/2017   Procedure: CORONARY BALLOON ANGIOPLASTY;  Surgeon: Wellington Hampshire, MD;  Location: [place] [facility] LAB;  Service: Cardiovascular;  Laterality: N/A;  . CORONARY BALLOON ANGIOPLASTY N/A 09/20/2017   Procedure: CORONARY BALLOON ANGIOPLASTY;  Surgeon: Wellington Hampshire, MD;  Location: [place] [facility] LAB;  Service: Cardiovascular;  Laterality: N/A;  . DILATION AND CURETTAGE OF UTERUS    . ESOPHAGOGASTRODUODENOSCOPY (EGD) WITH PROPOFOL N/A 09/14/2014   Procedure: ESOPHAGOGASTRODUODENOSCOPY (EGD) WITH PROPOFOL;  Surgeon: Josefine Class, MD;  Location: [facility] END[facility];  Service: Endoscopy;  Laterality: N/A;  . ESOPHAGOGASTRODUODENOSCOPY (EGD) WITH PROPOFOL N/A 04/27/2016   Procedure: ESOPHAGOGASTRODUODENOSCOPY (EGD) WITH PROPOFOL;  Surgeon: [name], MD;  Location: [place];  Service: Endoscopy;  Laterality: N/A;  Diabetic - oral meds  . ESOPHAGOGASTRODUODENOSCOPY (EGD) WITH PROPOFOL N/A 01/12/2018   Procedure: ESOPHAGOGASTRODUODENOSCOPY (EGD) WITH PROPOFOL;  Surgeon: Toledo, Benay Pike, MD;  Location[facility];  Service: Endoscopy;  Laterality: N/A;  . GASTRIC BYPASS  09/2009   [place] (IM) NAIL INTERTROCHANTERIC Left 10/13/2018   Procedure: INTRAMEDULLARY (IM) NAIL INTERTROCHANTRIC;  Surgeon: Leandrew Koyanagi, MD;  Location: [place];  Service: Orthopedics;  Laterality: Left;  . Left Carotid to sublcavian artery bypass w/ subclavian artery ligation     a. Performed @ [place].  . LEFT HEART CATH AND CORONARY ANGIOGRAPHY Left 06/29/2017   Procedure: LEFT HEART CATH AND CORONARY ANGIOGRAPHY;  Surgeon: Wellington Hampshire, MD;  Location: [place] [facility];  Service: Cardiovascular;  Laterality: Left;  . LEFT HEART CATH AND CORONARY ANGIOGRAPHY N/A 09/20/2017   Procedure: LEFT HEART CATH AND  CORONARY ANGIOGRAPHY;  Surgeon: Wellington Hampshire, MD;  Location: [place] CV LAB;  Service: Cardiovascular;  Laterality: N/A;  . LEFT HEART CATH AND CORONARY ANGIOGRAPHY N/A 12/20/2017   Procedure: LEFT HEART CATH AND CORONARY ANGIOGRAPHY;  Surgeon: Wellington Hampshire, MD;  Location: [place] CV LAB;  Service: Cardiovascular;  Laterality: N/A;  . MELANOMA EXCISION  2016   Dr. Evorn Gong  . [place]  2002  . RIGHT OOPHORECTOMY    . SHOULDER ARTHROSCOPY WITH OPEN ROTATOR CUFF REPAIR Right 01/07/2016   Procedure: SHOULDER ARTHROSCOPY WITH DEBRIDMENT, SUBACHROMIAL DECOMPRESSION;  Surgeon: Corky Mull, MD;  [facility];  Service: Orthopedics;  Laterality: Right;  . SHOULDER ARTHROSCOPY WITH OPEN ROTATOR CUFF REPAIR Right 03/16/2017   Procedure: SHOULDER ARTHROSCOPY WITH OPEN ROTATOR CUFF REPAIR POSSIBLE BICEPS TENODESIS;  Surgeon: Corky Mull, MD;  Location: [facility];  Service: Orthopedics;  Laterality: Right;  . TRIGGER FINGER RELEASE Right     Middle Finger    Allergies  Allergies  Allergen Reactions  . Lipitor [Atorvastatin] Other (See Comments)    Leg pains  . Tramadol Other (See Comments)    Mouth feels like it's on fire    History of Present Illness    49 year old female with PMH as above.  She has not been able  to tolerate escalation of medical management in the setting of relative hypotension / adverse effects of antianginal therapy. She was diagnosed with CAD 08/2003 with LHC at that time showing moderate LAD disease that was medically managed.  [place] 2016 with PCI/DES placement to the mLAD.  06/2015 repeat LHC with patent LAD stent and significant D1 dz with PCI/DES. With recurrent angina 06/2017, LHC showed severe stenosis in D2 s/p PTCA. Recurrent angina 08/2017 with repeat LHC that showed restenosis of D2 treated with cutting balloon angioplasty. 12/2017 cath found 95% ostial stenosis / restenosis of D2 with significant apical LAD dz. Given recurrent restenosis of  D2, medical therapy was recommended. 01/2018 she underwent upper and lower endoscopy without significant findings. She was admitted 02/2018 with CVA and MRI showing late acute/early subacute left medial frontal lobe infarct. Echo showed normal LVSF without cardiac source of emboli. She was admitted 10/2018 after a mechanical fall, leading to displaced intertrochanteric fx of L femur s/p ORIF. She was seen 12/2018 with BB increased 2/2 palpitations / BP and prior cardiac monitoring unable to exclude AT. [place] admission 01/19/2019 with hypertensive urgency and improvement with titration of cardiac medications. ReDS vest 52 with addition of po lasix at discharge. She then seen in the ED 12/25 with report of SOB and CP. It was felt that her CP was more MSK in nature. At discharge, her lasix was changed to MWF. Future outpatient stress testing was also discussed at that time. Of note, she had elevated Cr with CKD and report of many loose bowel movements and recent heme positive stools and poor oral intake reported that admission. 12/26 echo showed EF 60-65% and no RWMA. On 12/30, she was seen by Dr. Grayland Ormond for iron deficiency anemia and heme positive stools and with plan for start of iron infusions; it was noted that she likely had poor absorption 2/2 gastric bypass surgery. She was seen in the office 02/06/2018, at which time her ReDS vest read 25% with patient euvolemic on exam. Given her report of continued SOB, her lasix was continued and Coreg held to see if this resulted in improvement in her symptoms. The following day, 1/6, she underwent stress testing that was ruled low risk. She then had an iron infusion (same day 02/08/2019), at which time we were contacted due to the patient's continued orthostatic hypotension. Recommendation was to hold her Lasix / potassium supplementation as her most recent labs showed a slight bump in Cr. Per the NP performing the iron infusions, the patient had also cut her amlodipine per  recommendation of her PCP. She then self decreased her dose of Imdur to 38m daily.   Today, 02/16/2019, she reports continued symptoms that have not worsened or improved since her last appointment. She notes continued feelings of dizziness/lightheadedness, despite the recent medication changes as outlined above.  She has not had any syncopal episodes. She also continues to note atypical chest pain with most recent stress test as above ruled low risk and known medical therapy treatment of her recurrent restenosis of D2. As previously described, these episodes are located in the substernal region and occasionally radiate to her left shoulder. They last minutes. She has not needed to use her SL nitro. She also continued to note occasional palpitations with last monitor unable to exclude AT. She reports drinking a significant amount of caffeine in the form of iced tea but states that her palpitations actually worsen when she cuts back on this caffeine. No feelings of racing HR. She continues  to note fatigue and "wants to sleep all day." She also notes SOB that is improved since taking her lasix (now discontinued 2/2 renal function and orthostasis) but still bothersome. She reports today that she has still been taking her morning dose of Coreg 12.59m daily. She notes stable 4 pillow orthopnea. She notes occasional LEE, usually worse on the L s/p her ORIF. She denied any abdominal distention or early satiety. No s/sx of acute bleeding with known melena and FOBT positive on iron infusions as above. She reports an upcoming appointment with nephrology 2/2 worsening renal function.   Home Medications    Prior to Admission medications   Medication Sig Start Date End Date Taking? Authorizing Provider  ALPRAZolam (Duanne Moron 1 MG tablet TAKE 1 TABLET BY MOUTH ONCE OR TWICE A DAY FOR ANXIETY 12/09/18   FBirdie Sons MD  amLODipine (NORVASC) 10 MG tablet Take 1 tablet (10 mg total) by mouth daily. 01/23/19 02/22/19   WWyvonnia Dusky MD  aspirin EC 81 MG EC tablet Take 1 tablet (81 mg total) by mouth daily for 30 doses. 01/23/19 02/22/19  WWyvonnia Dusky MD  buPROPion (WELLBUTRIN XL) 150 MG 24 hr tablet TAKE 1 TABLET BY MOUTH EVERY DAY Patient taking differently: Take 150 mg by mouth daily.  09/27/18   FBirdie Sons MD  clopidogrel (PLAVIX) 75 MG tablet Take 1 tablet (75 mg total) by mouth daily. 07/08/18   BTheora Gianotti NP  diphenoxylate-atropine (LOMOTIL) 2.5-0.025 MG tablet Take one capsule 1-4 times daily as needed for diarrhea Patient taking differently: Take 1-4 tablets by mouth daily. Take one capsule 1-4 times daily for diarrhea 01/10/19   FBirdie Sons MD  furosemide (LASIX) 20 MG tablet Take 1 tablet (20 mg total) by mouth 3 (three) times a week. On Monday, Wednesday and Friday 01/30/19 03/27/19  GNicole KindredA, DO  gabapentin (NEURONTIN) 300 MG capsule Take 1 capsule (300 mg total) by mouth 2 (two) times daily. 10/12/18   FBirdie Sons MD  Galcanezumab-gnlm (EMGALITY) 120 MG/ML SOAJ INJECT 240 MG SUBCUTANEOUSLY AS DIRECTED FOR THE FIRST MONTH. 11/11/18   [provider]  isosorbide mononitrate (IMDUR) 60 MG 24 hr tablet Take 1 tablet (60 mg total) by mouth daily. 01/23/19 02/22/19  WWyvonnia Dusky MD  levothyroxine (SYNTHROID, LEVOTHROID) 25 MCG tablet Take 1 tablet (25 mcg total) by mouth daily before breakfast. 03/28/18   FBirdie Sons MD  losartan (COZAAR) 50 MG tablet Take 1 tablet (50 mg total) by mouth daily. 01/23/19 02/22/19  WWyvonnia Dusky MD  nitroGLYCERIN (NITROSTAT) 0.4 MG SL tablet Place 1 tablet (0.4 mg total) under the tongue every 5 (five) minutes as needed for chest pain. 06/02/17   GMinna Merritts MD  pantoprazole (PROTONIX) 40 MG tablet Take 1 tablet (40 mg total) by mouth 2 (two) times daily. 03/02/18   FBirdie Sons MD  potassium chloride (KLOR-CON) 10 MEQ tablet Take 2 tablets (20 mEq total) by mouth 3 (three) times a week. On  Monday, Wednesday, Friday with Lasix 01/30/19 03/01/19  GEzekiel Slocumb DO  promethazine (PHENERGAN) 25 MG tablet Take 1 tablet (25 mg total) by mouth every 6 (six) hours as needed for nausea or vomiting. 11/07/18   FBirdie Sons MD  rosuvastatin (CRESTOR) 40 MG tablet Take 1 tablet (40 mg total) by mouth daily. 10/12/18   FBirdie Sons MD  Suvorexant (BELSOMRA) 10 MG TABS Take 10 mg by mouth at bedtime as needed. Patient  not taking: Reported on 02/07/2019 02/06/19   [name], MD  venlafaxine XR (EFFEXOR-XR) 75 MG 24 hr capsule Take 1 capsule (75 mg total) by mouth daily with breakfast. 09/27/18   [name], MD    Review of Systems    She denies pnd, n, v, syncope, weight gain, or early satiety. She reports continued / stable chest pain and unchanged from prior, palpitations, dyspnea, orthopnea, dizziness, edema.  All other systems reviewed and are otherwise negative except as noted above.  Physical Exam    VS:  BP 106/71 (BP Location: Left Arm, Patient Position: Sitting, Cuff Size: Normal)   Pulse 86   Ht '5\' 2"'  (1.575 m)   Wt 152 lb 12 oz (69.3 kg)   SpO2 98%   BMI 27.94 kg/m  , BMI Body mass index is 27.94 kg/m. GEN: pale female, in no acute distress. HEENT: normal. Neck: Supple, no JVD, carotid bruits, or masses. Cardiac: RRR, no murmurs, rubs, or gallops. No clubbing, cyanosis, edema.  Radials/DP/PT 1+ and equal bilaterally.  Respiratory:  Respirations regular and unlabored, clear to auscultation bilaterally. GI: Soft, nontender, nondistended, BS + x 4. MS: no deformity or atrophy. Skin: warm and dry, no rash. Neuro:  Strength and sensation are intact. Psych: Normal affect.  Accessory Clinical Findings    ECG personally reviewed by me today - NSR, 86bpm, LVH, nonspecific ST/T changes - no acute changes.  Filed Weights   02/16/19 0850  Weight: 152 lb 12 oz (69.3 kg)     Echo 01/28/2019 1. Left ventricular ejection fraction, by visual estimation, is 60  to 65%. The left ventricle has normal function. There is no left ventricular hypertrophy. 2. Left ventricular diastolic parameters are consistent with Grade I diastolic dysfunction (impaired relaxation). 3. The left ventricle has no regional wall motion abnormalities. 4. Global right ventricle has normal systolic function.The right ventricular size is normal. No increase in right ventricular wall thickness. 5. Left atrial size was normal. 6. TR signal is inadequate for assessing pulmonary artery systolic pressure.  Cath 12/20/2017  Non-stenotic 1st Diag lesion was previously treated.  Ost 1st Diag lesion is 40% stenosed.  Mid LAD-2 lesion is 5% stenosed.  Ost 2nd Diag to 2nd Diag lesion is 95% stenosed.  Scoring balloon angioplasty balloon angioplasty was performed.  Dist LAD lesion is 80% stenosed.  Mid LAD-1 lesion is 30% stenosed.  Prox Cx lesion is 40% stenosed.  Ost RCA to Prox RCA lesion is 40% stenosed. 1. Patent first diagonal and mid LAD stent with no significant restenosis. Significant disease affecting the distal LAD close to the apex as well as recurrent restenosis in the ostial second diagonal which was treated in the past with angioplasty most recently in August with cutting balloon angioplasty. 2. Normal left ventricular end-diastolic pressure. Left ventricular angiography was not performed. --Recommendations: The patient might have anginal symptoms related to disease in the distal LAD as well as ostial second diagonal. Unfortunately, the LAD disease is too distal to stent and the ostial diagonal has been treated twice with balloon angioplasty with recurrent restenosis. This is not an optimal location to place a stent as it will pinch the mid LAD. The only way to treat this with stent would be to perform bifurcation stenting including the mid LAD. However, the mid LAD disease does not seem to be obstructive and I feel that the long-term risks outweigh the  benefit. I recommend continuing medical therapy.    02/08/19 Stress Test Pharmacological myocardial  perfusion imaging study with no significant  ischemia Normal wall motion, EF estimated at 29% (depressed secondary to GI uptake artifact) No EKG changes concerning for ischemia at peak stress or in recovery. Low risk scan Signed, Esmond Plants, MD, Ph.D [facility]   12/2014 30 day monitor results Normal sinus rhythm, frequent episodes of sinus tachycardia, Unable to exclude atrial tachycardia episodes as patient reports she was at rest for several of her episodes Results discussed with her in detail at clinic visit  06/2018 Carotid US IMPRESSION: Minor carotid atherosclerosis. No hemodynamically significant ICA stenosis. Degree of narrowing less than 50% bilaterally by ultrasound criteria. Patent antegrade vertebral bilaterally   Assessment & Plan    Orthostatic hypotension Presyncope symptoms --BP remains soft today with orthostatics positive and continued report pf presyncope and SOB/DOE.  She has still been taking her morning dose of her Coreg; therefore, as previously noted, we will trial a few days off of her beta-blocker to see if this improves her symptoms.  Her amlodipine was decreased by her PCP, and she self decreased her Imdur to 30 mg daily.  She will let us know if she feels increased CP/tachycardic rate, or elevated blood pressures off of her Coreg. Given her continued noted palpitations and as her previous 1 month monitor could not rule out AT with h/o stroke, we will repeat a 1 month monitor as outlined below. We will also repeat a bilateral carotid artery ultrasound, given her previous ultrasound at almost 1 year ago showed mild bilateral carotid artery dz. Consider also her anemia as contributing to her sx as below and on current iron infusions.  CAD involving the native coronary arteries with stable angina/dyspnea --Continues to report intermittent chest pain  unchanged from previous reports.  Currently chest pain-free.  Recent 02/08/2019 stress test ruled low risk and without significant ischemia.  EKG today without significant ST or T changes.  Unable to uptitrate antianginal medication secondary to soft BP and positive orthostatics. Continue current DAPT with ASA and Plavix. As above, we will trial her completely off of her BB therapy to see if this improves her SOB and fatigue, and given the patient has still been taking her BB since her previous appointment. Continue amlodipine 42m daily for antianginal effect (recently decreased dose by her PCP), as well as her self-decreased Imdur 364mdaily. She will continue to monitor her HR and BP at home. Continue statin for risk factor modification.  Chronic diastolic heart failure, ICM --As noted at previous visits, reports improvement in shortness of breath with the short trial of Lasix; however, given her renal function, this has been discontinued.  She continues to be euvolemic on exam today.  Continue losartan.  As above, will trial her off of her beta-blocker to see if this improves her shortness of breath and fatigue.  Suspect that her shortness of breath is likely multifactorial in the setting of known anemia on iron infusions.  Anemia on iron infusions --Continue per recommendations of hematology / oncology. Reports continue melena but otherwise no other s/sx of acute bleed. Continues to appear pale on exam.  HLD --Continue statin.  Prior CVA --Continue DAPT with ASA and Plavix, as well as statin. BP well controlled to soft. Given could not exclude AT as of last monitor with patient continuing to note palpitations, we will repeat a monitor. She states that AT was not seen in 2 week monitors but captured if 1 month monitor completed; therefore, will plan for a full month monitor. We  will also repeat a bilateral carotid artery scan with previous scan ~1 year ago.  CKD3 ---She will see nephrology (Dr.  Holley Raring) for repeat labs. She is no longer on Lasix and potassium.  DM2 --Glycemic control for risk factor modification.   Disposition: Obtain 1 month Zio-XT for presyncope and palpitations with previous monitor unable to exclude AT. Obtain updated bilateral carotid ultrasound imaging given her presyncope and report of dizziness when she moves her head quickly. Trial off of BB given she has yet to attempt a trial off this medication with report of continued fatigue and SOB. Will plan for follow-up after her Zio results.   Arvil Chaco, PA-C 02/16/2019, 12:29 PM

## 2019-02-16 NOTE — Patient Instructions (Signed)
Medication Instructions:  Your physician has recommended you make the following change in your medication:  1. STOP Carvediolol  2. STOP Furosemide 3. STOP Potassium   *If you need a refill on your cardiac medications before your next appointment, please call your pharmacy*  Lab Work: None  If you have labs (blood work) drawn today and your tests are completely normal, you will receive your results only by: Marland Kitchen MyChart Message (if you have MyChart) OR . A paper copy in the mail If you have any lab test that is abnormal or we need to change your treatment, we will call you to review the results.  Testing/Procedures: Your physician has requested that you have a carotid duplex. This test is an ultrasound of the carotid arteries in your neck. It looks at blood flow through these arteries that supply the brain with blood. Allow one hour for this exam. There are no restrictions or special instructions.   Your physician has recommended that you wear a Zio monitor. This monitor is a medical device that records the heart's electrical activity. Doctors most often use these monitors to diagnose arrhythmias. Arrhythmias are problems with the speed or rhythm of the heartbeat. The monitor is a small device applied to your chest. You can wear one while you do your normal daily activities. While wearing this monitor if you have any symptoms to push the button and record what you felt. Once you have worn this monitor for the period of time provider prescribed (Usually 14 days), you will return the monitor device in the postage paid box. Once it is returned they will download the data collected and provide [place] with a report which the provider will then review and we will call you with those results. Important tips:  1. Avoid showering during the first 24 hours of wearing the monitor. 2. Avoid excessive sweating to help maximize wear time. 3. Do not submerge the device, no hot tubs, and no swimming pools. 4. Keep  any lotions or oils away from the patch. 5. After 24 hours you may shower with the patch on. Take brief showers with your back facing the shower head.  6. Do not remove patch once it has been placed because that will interrupt data and decrease adhesive wear time. 7. Push the button when you have any symptoms and write down what you were feeling. 8. Once you have completed wearing your monitor, remove and place into box which has postage paid and place in your outgoing mailbox.  9. If for some reason you have misplaced your box then call our office and we can provide another box and/or mail it off for you.        Follow-Up: At [facility], you and your health needs are our priority.  As part of our continuing mission to provide you with exceptional heart care, we have created designated Provider Care Teams.  These Care Teams include your primary Cardiologist (physician) and Advanced Practice Providers (APPs -  Physician Assistants and Nurse Practitioners) who all work together to provide you with the care you need, when you need it.  Your next appointment:   1 month(s) after you have worn your two monitors.  The format for your next appointment:   In Person  Provider:    You may see Ida Rogue, MD or one of the following Advanced Practice Providers on your designated Care Team:    Murray Hodgkins, NP  Christell Faith, PA-C  Marrianne Mood, PA-C

## 2019-02-18 ENCOUNTER — Other Ambulatory Visit: Payer: Self-pay

## 2019-02-18 ENCOUNTER — Emergency Department: Payer: Medicare Other

## 2019-02-18 ENCOUNTER — Encounter: Payer: Self-pay | Admitting: Emergency Medicine

## 2019-02-18 DIAGNOSIS — N390 Urinary tract infection, site not specified: Secondary | ICD-10-CM | POA: Diagnosis not present

## 2019-02-18 DIAGNOSIS — R29818 Other symptoms and signs involving the nervous system: Secondary | ICD-10-CM | POA: Diagnosis not present

## 2019-02-18 DIAGNOSIS — I130 Hypertensive heart and chronic kidney disease with heart failure and stage 1 through stage 4 chronic kidney disease, or unspecified chronic kidney disease: Secondary | ICD-10-CM | POA: Insufficient documentation

## 2019-02-18 DIAGNOSIS — Z79899 Other long term (current) drug therapy: Secondary | ICD-10-CM | POA: Insufficient documentation

## 2019-02-18 DIAGNOSIS — Z955 Presence of coronary angioplasty implant and graft: Secondary | ICD-10-CM | POA: Diagnosis not present

## 2019-02-18 DIAGNOSIS — I5022 Chronic systolic (congestive) heart failure: Secondary | ICD-10-CM | POA: Insufficient documentation

## 2019-02-18 DIAGNOSIS — R531 Weakness: Secondary | ICD-10-CM | POA: Insufficient documentation

## 2019-02-18 DIAGNOSIS — Z87891 Personal history of nicotine dependence: Secondary | ICD-10-CM | POA: Insufficient documentation

## 2019-02-18 DIAGNOSIS — R29810 Facial weakness: Secondary | ICD-10-CM | POA: Insufficient documentation

## 2019-02-18 DIAGNOSIS — Z20822 Contact with and (suspected) exposure to covid-19: Secondary | ICD-10-CM | POA: Diagnosis not present

## 2019-02-18 DIAGNOSIS — E1122 Type 2 diabetes mellitus with diabetic chronic kidney disease: Secondary | ICD-10-CM | POA: Insufficient documentation

## 2019-02-18 DIAGNOSIS — Z7982 Long term (current) use of aspirin: Secondary | ICD-10-CM | POA: Insufficient documentation

## 2019-02-18 DIAGNOSIS — Z7902 Long term (current) use of antithrombotics/antiplatelets: Secondary | ICD-10-CM | POA: Diagnosis not present

## 2019-02-18 DIAGNOSIS — N183 Chronic kidney disease, stage 3 unspecified: Secondary | ICD-10-CM | POA: Insufficient documentation

## 2019-02-18 DIAGNOSIS — E039 Hypothyroidism, unspecified: Secondary | ICD-10-CM | POA: Diagnosis not present

## 2019-02-18 DIAGNOSIS — I2510 Atherosclerotic heart disease of native coronary artery without angina pectoris: Secondary | ICD-10-CM | POA: Insufficient documentation

## 2019-02-18 DIAGNOSIS — Z85820 Personal history of malignant melanoma of skin: Secondary | ICD-10-CM | POA: Diagnosis not present

## 2019-02-18 DIAGNOSIS — D126 Benign neoplasm of colon, unspecified: Secondary | ICD-10-CM | POA: Insufficient documentation

## 2019-02-18 LAB — DIFFERENTIAL
Abs Immature Granulocytes: 0.02 10*3/uL (ref 0.00–0.07)
Basophils Absolute: 0.1 10*3/uL (ref 0.0–0.1)
Basophils Relative: 1 %
Eosinophils Absolute: 0.2 10*3/uL (ref 0.0–0.5)
Eosinophils Relative: 3 %
Immature Granulocytes: 0 %
Lymphocytes Relative: 22 %
Lymphs Abs: 1.3 10*3/uL (ref 0.7–4.0)
Monocytes Absolute: 0.4 10*3/uL (ref 0.1–1.0)
Monocytes Relative: 6 %
Neutro Abs: 4.1 10*3/uL (ref 1.7–7.7)
Neutrophils Relative %: 68 %

## 2019-02-18 LAB — TROPONIN I (HIGH SENSITIVITY): Troponin I (High Sensitivity): 8 ng/L (ref <18)

## 2019-02-18 LAB — CBC
HCT: 32.7 % — ABNORMAL LOW (ref 36.0–46.0)
Hemoglobin: 9.9 g/dL — ABNORMAL LOW (ref 12.0–15.0)
MCH: 22.3 pg — ABNORMAL LOW (ref 26.0–34.0)
MCHC: 30.3 g/dL (ref 30.0–36.0)
MCV: 73.8 fL — ABNORMAL LOW (ref 80.0–100.0)
Platelets: 187 10*3/uL (ref 150–400)
RBC: 4.43 MIL/uL (ref 3.87–5.11)
RDW: 22.7 % — ABNORMAL HIGH (ref 11.5–15.5)
WBC: 6 10*3/uL (ref 4.0–10.5)
nRBC: 0 % (ref 0.0–0.2)

## 2019-02-18 LAB — PROTIME-INR
INR: 1 (ref 0.8–1.2)
Prothrombin Time: 13.3 seconds (ref 11.4–15.2)

## 2019-02-18 LAB — COMPREHENSIVE METABOLIC PANEL
ALT: 16 U/L (ref 0–44)
AST: 17 U/L (ref 15–41)
Albumin: 3.4 g/dL — ABNORMAL LOW (ref 3.5–5.0)
Alkaline Phosphatase: 111 U/L (ref 38–126)
Anion gap: 12 (ref 5–15)
BUN: 37 mg/dL — ABNORMAL HIGH (ref 6–20)
CO2: 23 mmol/L (ref 22–32)
Calcium: 8.5 mg/dL — ABNORMAL LOW (ref 8.9–10.3)
Chloride: 103 mmol/L (ref 98–111)
Creatinine, Ser: 2.28 mg/dL — ABNORMAL HIGH (ref 0.44–1.00)
GFR calc Af Amer: 28 mL/min — ABNORMAL LOW (ref >60)
GFR calc non Af Amer: 24 mL/min — ABNORMAL LOW (ref >60)
Glucose, Bld: 94 mg/dL (ref 70–99)
Potassium: 3.6 mmol/L (ref 3.5–5.1)
Sodium: 138 mmol/L (ref 135–145)
Total Bilirubin: 0.5 mg/dL (ref 0.3–1.2)
Total Protein: 6.5 g/dL (ref 6.5–8.1)

## 2019-02-18 LAB — APTT: aPTT: 39 seconds — ABNORMAL HIGH (ref 24–36)

## 2019-02-18 MED ORDER — SODIUM CHLORIDE 0.9 % IV BOLUS
1000.0000 mL | Freq: Once | INTRAVENOUS | Status: AC
  Administered 2019-02-18: 1000 mL via INTRAVENOUS

## 2019-02-18 MED ORDER — SODIUM CHLORIDE 0.9% FLUSH: 3.0000 mL | Freq: Once | INTRAVENOUS | Status: DC

## 2019-02-18 NOTE — ED Triage Notes (Signed)
Pt to ED via POV c/o dizziness since yesterday at 4 pm. Pt states that she is also having weakness on the left side. Pt states that son told her that her face was drooping earlier but no drooping noted at this time. Pt does have equal grip, with left being weaker than right, however, pt was able to catch her pocketbook with her left hand as it was falling. Pt does appear to have slurred speech. Pt is A & O x 4.

## 2019-02-19 ENCOUNTER — Emergency Department: Payer: Medicare Other

## 2019-02-19 ENCOUNTER — Emergency Department
Admission: EM | Admit: 2019-02-19 | Discharge: 2019-02-19 | Disposition: A | Discharge status: Home / Self Care | Payer: Medicare Other | Attending: Student in an Organized Health Care Education/Training Program | Admitting: Student in an Organized Health Care Education/Training Program

## 2019-02-19 DIAGNOSIS — R531 Weakness: Secondary | ICD-10-CM | POA: Diagnosis not present

## 2019-02-19 DIAGNOSIS — N390 Urinary tract infection, site not specified: Secondary | ICD-10-CM

## 2019-02-19 LAB — URINALYSIS, COMPLETE (UACMP) WITH MICROSCOPIC
Bilirubin Urine: NEGATIVE
Glucose, UA: NEGATIVE mg/dL
Hgb urine dipstick: NEGATIVE
Ketones, ur: NEGATIVE mg/dL
Nitrite: NEGATIVE
Protein, ur: NEGATIVE mg/dL
Specific Gravity, Urine: 1.008 (ref 1.005–1.030)
WBC, UA: >50 WBC/hpf — ABNORMAL HIGH (ref 0–5)
pH: 6 (ref 5.0–8.0)

## 2019-02-19 LAB — POC SARS CORONAVIRUS 2 AG: SARS Coronavirus 2 Ag: NEGATIVE

## 2019-02-19 MED ORDER — SODIUM CHLORIDE 0.9 % IV SOLN
1.0000 g | Freq: Once | INTRAVENOUS | Status: AC
  Administered 2019-02-19: 1 g via INTRAVENOUS
  Filled 2019-02-19: qty 10

## 2019-02-19 MED ORDER — FLUCONAZOLE 100 MG PO TABS
100.0000 mg | ORAL_TABLET | Freq: Once | ORAL | Status: AC
  Administered 2019-02-19: 100 mg via ORAL
  Filled 2019-02-19: qty 1

## 2019-02-19 MED ORDER — CEPHALEXIN 250 MG PO CAPS: 250.0000 mg | ORAL_CAPSULE | Freq: Three times a day (TID) | ORAL | 0 refills | Status: AC

## 2019-02-19 MED ORDER — FLUCONAZOLE 100 MG PO TABS: 100.0000 mg | ORAL_TABLET | Freq: Every day | ORAL | 0 refills | Status: AC

## 2019-02-19 NOTE — ED Notes (Signed)
Patient resting quietly, awaiting MD.

## 2019-02-19 NOTE — ED Notes (Signed)
Patient transported to MRI 

## 2019-02-19 NOTE — ED Notes (Signed)
Pt updated on wait. Pt verbalizes understanding.  

## 2019-02-19 NOTE — ED Notes (Signed)
Pt given coke and jello

## 2019-02-19 NOTE — ED Notes (Signed)
Pt taking to mri via stretcher

## 2019-02-19 NOTE — ED Provider Notes (Signed)
[facility] Emergency Department Provider Note  Time seen: 6:20 AM  I have reviewed the triage vital signs and the nursing notes.   HISTORY  Chief Complaint Dizziness and Weakness   HPI [name] is a 49 y.o. female with a complex medical history including bipolar, CAD with stents, CKD stage III, CVA with left-sided weakness, diabetes, hypertension, hyperlipidemia, presents to the emergency department for left-sided weakness.  According to the patient she essentially had improved/resolved from her stroke in January 2020 which she had some left-sided deficits.  Patient ambulates using a walker but states for the past 2 days or so she has not been able to do so due to weakness in her left leg.  States her son has had to push her on the rolling walker.  Patient's son was concerned she may have had a stroke so he brought her to the emergency department for evaluation.  Patient denies any fever cough congestion or shortness of breath.  Denies any vomiting diarrhea.  Past Medical History:  Diagnosis Date  . Acute pyelonephritis   . Anemia    iron deficiency anemia  . Aortic arch aneurysm ([place])   . Bipolar disorder (DISH)   . BRCA negative 2014  . CAD (coronary artery disease)    a. 08/2003 Cath: LAD 30-40-med Rx; b. 11/2014 PCI: LAD 33m(3.25x23 Xience Alpine DES); c. 06/2015 PCI: D1 (2.25x12 Resolute Integrity DES); d. 06/2017 PCI: Patent mLAD stent, D2 95 (PTCA); e. 09/2017 PCI: D2 99ost (CBA); d. 12/2017 Cath: LM nl, LAD 356m80d (small), D1 40ost, D2 95ost, LCX 40p, RCA 40ost/p->Med rx for D2 given restenosis.  . CKD (chronic kidney disease), stage III   . Colon polyp   . CVA (cerebral vascular accident) (HCScappoose   Left side weakness.   . Diabetes (HCHayesville  . Family history of breast cancer    BRCA neg 2014  . Gastric ulcer 04/27/2011  . History of echocardiogram    a. 03/2017 Echo: EF 60-65%, no rwma; b. 02/2018 Echo: EF 60-65%, no rwma. Nl RV fxn. No cardiac  source of emboli (admitted w/ stroke).  . [place] KitchenTN (hypertension)   . Hyperlipemia   . Hypothyroid   . Malignant melanoma of skin of scalp (HCSandpoint  . MI, acute, non ST segment elevation (HCCambridge  . Neuromuscular disorder (HCSeacliff  . Orthostatic hypotension   . S/P drug eluting coronary stent placement 06/04/2015  . Sepsis (HCAyrshire2/14/2019  . [facility] ([facility]   a. 02/2018 MRI: 29m55mate acute/early subacute L medial frontal lobe inarct; b. 02/2018 MRA No large vessel occlusion or aneurysm. Mod to sev L P2 stenosis. thready L vertebral artery, diffusely dzs'd; c. 02/2018 Carotid U/S: <50% bilat ICA dzs.    Patient Active Problem List   Diagnosis Date Noted  . Chest pain 01/27/2019  . Melena   . Hypertensive urgency   . Recurrent chest pain 01/19/2019  . Depression, major, single episode, moderate (HCCNorth Massapequa1/07/2018  . Fall   . Closed nondisplaced intertrochanteric fracture of left femur (HCCMilpitas9/10/2018  . Femur fracture, left (HCCWedgefield9/10/2018  . Hypotension 06/26/2018  . Autonomic neuropathy 03/24/2018  . Acute delirium 03/03/2018  . H/O gastric bypass 03/02/2018  . Hypertension associated with stage 3 chronic kidney disease due to type 2 diabetes mellitus (HCCUnity1/19/2020  . SI (sacroiliac) joint dysfunction 12/02/2017  . MI, acute, non ST segment elevation (HCCElko [place] . Effort angina (HCCSpearville5/28/2019  .  Unstable angina ([place]) 06/24/2017  . Syncope 04/09/2017  . Insomnia 03/18/2017  . Ischemic cardiomyopathy   . Arthritis   . Anxiety   . Tendinitis of upper biceps tendon of right shoulder 03/16/2017  . Degenerative tear of glenoid labrum of right shoulder 03/15/2017  . Acute colitis 01/27/2017  . Hx of colonic polyps   . H/O medication noncompliance 12/14/2015  . Emesis   . CAD in native artery   . Hypertensive heart disease   . CKD (chronic kidney disease), stage III ([place]) 06/05/2015  . Status post bariatric surgery 06/04/2015  . Colitis 06/03/2015  . Carotid stenosis 04/30/2015  . Type 2  diabetes mellitus with stage 3 chronic kidney disease, without long-term current use of insulin ([place])   . Stable angina pectoris ([address]) 04/17/2015  . Iron deficiency anemia 03/22/2015  . Vitamin B12 deficiency 02/18/2015  . Misuse of medications for pain 02/18/2015  . Major depressive disorder, recurrent, severe with psychotic features ([place]) 02/15/2015  . Helicobacter pylori infection 11/23/2014  . Hemiparesis, left ([place]) 11/23/2014  . Benign neoplasm of colon 11/20/2014  . Malignant melanoma ([place]) 08/25/2014  . Chronic systolic CHF (congestive heart failure) ([place])   . Incomplete bladder emptying 07/12/2014  . Hypothyroidism 12/30/2013  . Aberrant subclavian artery 11/17/2013  . Multiple sclerosis ([place]) 11/02/2013  . History of CVA with residual deficit 06/20/2013  . Headache, migraine 05/29/2013  . Hyperlipidemia   . GERD (gastroesophageal reflux disease)   . Neuropathy ([place]) 01/02/2011  . Stroke ([place]) 06/21/2008  . Depression with anxiety 05/01/2008  . Essential hypertension 05/01/2008    Past Surgical History:  Procedure Laterality Date  . APPENDECTOMY    . CARDIAC CATHETERIZATION N/A 11/09/2014   Procedure: Coronary Angiography;  Surgeon: Minna Merritts, MD;  Location: [place] [facility];  Service: Cardiovascular;  Laterality: N/A;  . CARDIAC CATHETERIZATION N/A 11/12/2014   Procedure: Coronary Stent Intervention;  Surgeon: Isaias Cowman, MD;  Location: [place] [facility];  Service: Cardiovascular;  Laterality: N/A;  . CARDIAC CATHETERIZATION N/A 04/18/2015   Procedure: Left Heart Cath and Coronary Angiography;  Surgeon: Minna Merritts, MD;  Location: [place] [facility];  Service: Cardiovascular;  Laterality: N/A;  . CARDIAC CATHETERIZATION Left 06/04/2015   Procedure: Left Heart Cath and Coronary Angiography;  Surgeon: Wellington Hampshire, MD;  Location: [place] [facility];  Service: Cardiovascular;  Laterality: Left;  . CARDIAC CATHETERIZATION N/A 06/04/2015    Procedure: Coronary Stent Intervention;  Surgeon: Wellington Hampshire, MD;  Location: [place] [facility];  Service: Cardiovascular;  Laterality: N/A;  . CESAREAN SECTION  2001  . CHOLECYSTECTOMY N/A 11/18/2016   Procedure: LAPAROSCOPIC CHOLECYSTECTOMY WITH INTRAOPERATIVE CHOLANGIOGRAM;  Surgeon: Christene Lye, MD;  Location: [facility];  Service: General;  Laterality: N/A;  . COLONOSCOPY WITH PROPOFOL N/A 04/27/2016   Procedure: COLONOSCOPY WITH PROPOFOL;  Surgeon: [name], MD;  Location: [place];  Service: Endoscopy;  Laterality: N/A;  . COLONOSCOPY WITH PROPOFOL N/A 01/12/2018   Procedure: COLONOSCOPY WITH PROPOFOL;  Surgeon: Tol[place], Benay Pike, MD;  Location: [facility];  Service: Endoscopy;  Laterality: N/A;  . CORONARY ANGIOPLASTY    . CORONARY BALLOON ANGIOPLASTY N/A 06/29/2017   Procedure: CORONARY BALLOON ANGIOPLASTY;  Surgeon: Wellington Hampshire, MD;  Location: [place] [facility];  Service: Cardiovascular;  Laterality: N/A;  . CORONARY BALLOON ANGIOPLASTY N/A 09/20/2017   Procedure: CORONARY BALLOON ANGIOPLASTY;  Surgeon: Wellington Hampshire, MD;  Location: [place] [facility];  Service: Cardiovascular;  Laterality: N/A;  . DILATION AND CURETTAGE OF UTERUS    . ESOPHAGOGASTRODUODENOSCOPY (EGD) WITH PROPOFOL N/A 09/14/2014   Procedure: ESOPHAGOGASTRODUODENOSCOPY (EGD) WITH PROPOFOL;  Surgeon: Josefine Class, MD;  Location: [facility];  Service: Endoscopy;  Laterality: N/A;  . ESOPHAGOGASTRODUODENOSCOPY (EGD) WITH PROPOFOL N/A 04/27/2016   Procedure: ESOPHAGOGASTRODUODENOSCOPY (EGD) WITH PROPOFOL;  Surgeon: [name], MD;  Location: [place];  Service: Endoscopy;  Laterality: N/A;  Diabetic - oral meds  . ESOPHAGOGASTRODUODENOSCOPY (EGD) WITH PROPOFOL N/A 01/12/2018   Procedure: ESOPHAGOGASTRODUODENOSCOPY (EGD) WITH PROPOFOL;  Surgeon: [place], Benay Pike, MD;  Location: [facility];  Service: Endoscopy;  Laterality: N/A;  . GASTRIC BYPASS  09/2009    [place] (IM) NAIL INTERTROCHANTERIC Left 10/13/2018   Procedure: INTRAMEDULLARY (IM) NAIL INTERTROCHANTRIC;  Surgeon: Leandrew Koyanagi, MD;  Location: [place];  Service: Orthopedics;  Laterality: Left;  . Left Carotid to sublcavian artery bypass w/ subclavian artery ligation     a. Performed @ [place].  . LEFT HEART CATH AND CORONARY ANGIOGRAPHY Left 06/29/2017   Procedure: LEFT HEART CATH AND CORONARY ANGIOGRAPHY;  Surgeon: Wellington Hampshire, MD;  Location: [place] [facility];  Service: Cardiovascular;  Laterality: Left;  . LEFT HEART CATH AND CORONARY ANGIOGRAPHY N/A 09/20/2017   Procedure: LEFT HEART CATH AND CORONARY ANGIOGRAPHY;  Surgeon: Wellington Hampshire, MD;  Location: [place] [facility];  Service: Cardiovascular;  Laterality: N/A;  . LEFT HEART CATH AND CORONARY ANGIOGRAPHY N/A 12/20/2017   Procedure: LEFT HEART CATH AND CORONARY ANGIOGRAPHY;  Surgeon: Wellington Hampshire, MD;  Location: [place] [facility];  Service: Cardiovascular;  Laterality: N/A;  . MELANOMA EXCISION  2016   Dr. Evorn Gong  . [place]  2002  . RIGHT OOPHORECTOMY    . SHOULDER ARTHROSCOPY WITH OPEN ROTATOR CUFF REPAIR Right 01/07/2016   Procedure: SHOULDER ARTHROSCOPY WITH DEBRIDMENT, SUBACHROMIAL DECOMPRESSION;  Surgeon: Corky Mull, MD;  Location: [facility];  Service: Orthopedics;  Laterality: Right;  . SHOULDER ARTHROSCOPY WITH OPEN ROTATOR CUFF REPAIR Right 03/16/2017   Procedure: SHOULDER ARTHROSCOPY WITH OPEN ROTATOR CUFF REPAIR POSSIBLE BICEPS TENODESIS;  Surgeon: Corky Mull, MD;  Location: [facility];  Service: Orthopedics;  Laterality: Right;  . TRIGGER FINGER RELEASE Right     [place]    Prior to Admission medications   Medication Sig Start Date End Date Taking? Authorizing Provider  ALPRAZolam Duanne Moron) 1 MG tablet TAKE 1 TABLET BY MOUTH ONCE OR TWICE A DAY FOR ANXIETY 12/09/18   [name], MD  amLODipine (NORVASC) 10 MG tablet Take 1 tablet (10 mg  total) by mouth daily. Patient taking differently: Take 5 mg by mouth daily.  01/23/19 02/22/19  Wyvonnia Dusky, MD  aspirin EC 81 MG EC tablet Take 1 tablet (81 mg total) by mouth daily for 30 doses. 01/23/19 02/22/19  Wyvonnia Dusky, MD  buPROPion (WELLBUTRIN XL) 150 MG 24 hr tablet TAKE 1 TABLET BY MOUTH EVERY DAY Patient taking differently: Take 150 mg by mouth daily.  09/27/18   [name], MD  clopidogrel (PLAVIX) 75 MG tablet Take 1 tablet (75 mg total) by mouth daily. 07/08/18   Theora Gianotti, NP  diphenoxylate-atropine (LOMOTIL) 2.5-0.025 MG tablet Take one capsule 1-4 times daily as needed for diarrhea Patient taking differently: Take 1-4 tablets by mouth daily. Take one capsule 1-4 times daily for diarrhea 01/10/19   [name], MD  gabapentin (NEURONTIN) 300 MG capsule Take 1  capsule (300 mg total) by mouth 2 (two) times daily. 10/12/18   [name], MD  Galcanezumab-gnlm (EMGALITY) 120 MG/ML SOAJ INJECT 240 MG SUBCUTANEOUSLY AS DIRECTED FOR THE FIRST MONTH. 11/11/18   [provider]  isosorbide mononitrate (IMDUR) 60 MG 24 hr tablet Take 1 tablet (60 mg total) by mouth daily. Patient taking differently: Take 30 mg by mouth daily.  01/23/19 02/22/19  Wyvonnia Dusky, MD  levothyroxine (SYNTHROID, LEVOTHROID) 25 MCG tablet Take 1 tablet (25 mcg total) by mouth daily before breakfast. 03/28/18   [name], MD  losartan (COZAAR) 50 MG tablet Take 1 tablet (50 mg total) by mouth daily. 01/23/19 02/22/19  Wyvonnia Dusky, MD  nitroGLYCERIN (NITROSTAT) 0.4 MG SL tablet Place 1 tablet (0.4 mg total) under the tongue every 5 (five) minutes as needed for chest pain. 06/02/17   Minna Merritts, MD  pantoprazole (PROTONIX) 40 MG tablet Take 1 tablet (40 mg total) by mouth 2 (two) times daily. 03/02/18   [name], MD  promethazine (PHENERGAN) 25 MG tablet Take 1 tablet (25 mg total) by mouth every 6 (six) hours as needed for nausea or  vomiting. 11/07/18   [name], MD  rosuvastatin (CRESTOR) 40 MG tablet Take 1 tablet (40 mg total) by mouth daily. 10/12/18   [name], MD  Suvorexant (BELSOMRA) 10 MG TABS Take 10 mg by mouth at bedtime as needed. 02/06/19   [name], MD  venlafaxine XR (EFFEXOR-XR) 75 MG 24 hr capsule Take 1 capsule (75 mg total) by mouth daily with breakfast. 09/27/18   [name], MD    Allergies  Allergen Reactions  . Lipitor [Atorvastatin] Other (See Comments)    Leg pains  . Tramadol Other (See Comments)    Mouth feels like it's on fire    Family History  Problem Relation Age of Onset  . Hypertension Mother   . Anxiety disorder Mother   . Depression Mother   . Bipolar disorder Mother   . Heart disease Mother        No details  . Hyperlipidemia Mother   . Kidney disease Father   . Heart disease Father 18  . Hypertension Father   . Diabetes Father   . Stroke Father   . Colon cancer Father        dx in his 5's  . Anxiety disorder Father   . Depression Father   . Skin cancer Father   . Kidney disease Sister   . Thyroid nodules Sister   . Hypertension Sister   . Hypertension Sister   . Diabetes Sister   . Hyperlipidemia Sister   . Depression Sister   . Breast cancer Maternal Aunt 37  . Breast cancer Maternal Aunt 20  . Ovarian cancer Cousin   . Colon cancer Cousin   . Breast cancer Other   . Kidney cancer Neg Hx   . Bladder Cancer Neg Hx     Social History Social History   Tobacco Use  . Smoking status: Former Smoker    Types: Cigarettes    Quit date: 08/31/1994    Years since quitting: 24.4  . Smokeless tobacco: Never Used  . Tobacco comment: quit 28 years ago  Substance Use Topics  . Alcohol use: No    Alcohol/week: 0.0 standard drinks  . Drug use: No    Review of Systems Constitutional: Negative for fever. Cardiovascular: Negative for chest pain. Respiratory: Negative for shortness of  breath. Gastrointestinal: Negative for abdominal  pain, vomiting and diarrhea. Genitourinary: Patient does state mild dysuria but states she recently completed a course of antibiotics for UTI. Musculoskeletal: Negative for musculoskeletal complaints Neurological: Negative for headache All other ROS negative  ____________________________________________   PHYSICAL EXAM:  VITAL SIGNS: ED Triage Vitals  Enc Vitals Group     BP 02/18/19 1713 (!) 91/57     Pulse Rate 02/18/19 1713 77     Resp 02/18/19 1713 18     Temp 02/18/19 1713 98.2 F (36.8 C)     Temp Source 02/18/19 1713 Oral     SpO2 02/18/19 1713 100 %     Weight 02/18/19 1702 150 lb (68 kg)     Height 02/18/19 1702 '5\' 2"'  (1.575 m)     Head Circumference --      Peak Flow --      Pain Score 02/18/19 1701 0     Pain Loc --      Pain Edu? --      Excl. in [place]? --     Constitutional: Alert and oriented. Well appearing and in no distress. Eyes: Normal exam ENT      Head: Normocephalic and atraumatic.      Mouth/Throat: Mucous membranes are moist. Cardiovascular: Normal rate, regular rhythm. Respiratory: Normal respiratory effort without tachypnea nor retractions. Breath sounds are clear  Gastrointestinal: Soft and nontender. No distention. Musculoskeletal: Nontender with normal range of motion in all extremities.  Neurologic:  Normal speech and language.  Cranial nerves appear intact.  Equal grip strength bilaterally is 5/5 strength in upper extremities.  Patient however has 4/5 strength in the right lower extremity and 3/5 in her left lower extremity with difficulty lifting the left lower extremity several inches off the bed.  Also states diminished sensation in left lower extremity. Skin:  Skin is warm, dry and intact.  Psychiatric: Mood and affect are normal.  ____________________________________________    EKG  EKG viewed and interpreted by myself shows a normal sinus rhythm 82 bpm with a narrow QRS, normal axis, normal intervals, nonspecific ST changes without  elevation  ____________________________________________    RADIOLOGY  CT scan of the head shows no acute abnormality.  Chronic microvascular injuries.  ____________________________________________   INITIAL IMPRESSION / ASSESSMENT AND PLAN / ED COURSE  Pertinent labs & imaging results that were available during my care of the patient were reviewed by me and considered in my medical decision making (see chart for details).   Patient presents to the emergency department for weakness mostly of her left side and especially left lower extremity.  History of prior CVA 1/20 with left-sided deficits but she feels her symptoms have worsened over the past 2 to 3 days.  Patient has a borderline low-grade temperature 99.7 in the emergency department.  States she has been experiencing some dysuria recently completed a course of antibiotics.  We will check labs, urinalysis.  Patient CT scan of the head shows no acute abnormalities however given the patient's left lower extremity weakness worse over the past 2 days we will proceed with MRI imaging of the brain to rule out acute CVA.  Patient's lab work is largely within normal limits/baseline for the patient.  MRI, rapid Covid and urinalysis pending.  Patient care signed out to oncoming physician.  [name] was evaluated in Emergency Department on 02/19/2019 for the symptoms described in the history of present illness. She was evaluated in the context of the [place] COVID-19  pandemic, which necessitated consideration that the patient might be at risk for infection with the SARS-CoV-2 virus that causes COVID-19. Institutional protocols and algorithms that pertain to the evaluation of patients at risk for COVID-19 are in a state of rapid change based on information released by regulatory bodies including the CDC and federal and state organizations. These policies and algorithms were followed during the patient's care in the  ED.  ____________________________________________   FINAL CLINICAL IMPRESSION(S) / ED DIAGNOSES  weakness   Harvest Dark, MD 02/19/19 [phone]

## 2019-02-19 NOTE — ED Provider Notes (Signed)
Patient received in signout from Dr. Kerman Passey.  MRI is reassuring without evidence of acute abnormality.  Her urinalysis does appear to be consistent with infection particular given her dysuria.  She is not meeting any signs or symptoms of sepsis at this time.  Will give dose of IV antibiotics.  Will repeat urine culture.  Patient unsure of what medication was most recently prescribed.  Most recent culture was positive for Klebsiella that was sensitive to cephalosporins.  Does report she gets yeast infections when taking abx for uti.  Patient feels improved after fluids.  HDS.  Appropriate for further management as an outpatient.     Merlyn Lot, MD 02/19/19 [phone]

## 2019-02-19 NOTE — ED Notes (Signed)
Patient states she came because her son thought she might be having a stroke because the left side of her face was drooping and she was having trouble using her left side.  Patient reports symptoms have since resolved.  Patient reports history of multiple strokes with residual weakness to both sides.

## 2019-02-20 ENCOUNTER — Ambulatory Visit (INDEPENDENT_AMBULATORY_CARE_PROVIDER_SITE_OTHER): Payer: Medicare Other

## 2019-02-20 ENCOUNTER — Telehealth: Payer: Self-pay | Admitting: Cardiovascular Disease

## 2019-02-20 ENCOUNTER — Other Ambulatory Visit: Payer: Self-pay

## 2019-02-20 DIAGNOSIS — I6529 Occlusion and stenosis of unspecified carotid artery: Secondary | ICD-10-CM

## 2019-02-20 DIAGNOSIS — E1122 Type 2 diabetes mellitus with diabetic chronic kidney disease: Secondary | ICD-10-CM | POA: Diagnosis not present

## 2019-02-20 DIAGNOSIS — N184 Chronic kidney disease, stage 4 (severe): Secondary | ICD-10-CM | POA: Diagnosis not present

## 2019-02-20 NOTE — Telephone Encounter (Signed)
Spoke with patient and she was in office today for carotid US and she wanted to give me her monitor. She had zio monitor placed but due to having MRI it had to be removed. Company did send her a new one and advised for her to mail other one back to company. She verbalized understanding of our conversation with no further questions at this time.

## 2019-02-20 NOTE — Telephone Encounter (Signed)
Patient in office for carotid test  Patient wanting to speak with Pam, no details given  Please call to discuss

## 2019-02-21 DIAGNOSIS — R200 Anesthesia of skin: Secondary | ICD-10-CM | POA: Diagnosis not present

## 2019-02-21 LAB — URINE CULTURE: Culture: <10000 — AB

## 2019-02-24 DIAGNOSIS — N184 Chronic kidney disease, stage 4 (severe): Secondary | ICD-10-CM | POA: Diagnosis not present

## 2019-02-24 DIAGNOSIS — N2581 Secondary hyperparathyroidism of renal origin: Secondary | ICD-10-CM | POA: Diagnosis not present

## 2019-02-24 DIAGNOSIS — D631 Anemia in chronic kidney disease: Secondary | ICD-10-CM | POA: Diagnosis not present

## 2019-02-24 DIAGNOSIS — E1122 Type 2 diabetes mellitus with diabetic chronic kidney disease: Secondary | ICD-10-CM | POA: Diagnosis not present

## 2019-02-24 DIAGNOSIS — I10 Essential (primary) hypertension: Secondary | ICD-10-CM | POA: Diagnosis not present

## 2019-02-25 ENCOUNTER — Encounter: Payer: Self-pay | Admitting: Family Medicine

## 2019-02-28 ENCOUNTER — Other Ambulatory Visit: Payer: Self-pay | Admitting: Family Medicine

## 2019-02-28 NOTE — Telephone Encounter (Signed)
Requested medication (s) are due for refill today: yes  Requested medication (s) are on the active medication list:  yes  Last refill: 02/18/2019  Future visit scheduled: yes  Notes to clinic:  This refill cannot be delegated   Requested Prescriptions  Pending Prescriptions Disp Refills   diphenoxylate-atropine (LOMOTIL) 2.5-0.025 MG tablet [Pharmacy Med Name: Diphenoxylate-Atropine 2.5-0.025 MG Oral Tablet] 30 tablet 0    Sig: TAKE 1 TABLET BY MOUTH 1 TO 4 TIMES DAILY AS NEEDED FOR DIARRHEA      Not Delegated - Gastroenterology:  Antidiarrheals Failed - 02/28/2019  6:56 AM      Failed - This refill cannot be delegated      Passed - Valid encounter within last 12 months    Recent Outpatient Visits           3 weeks ago Chest pain, unspecified type   [facility] [name], MD   4 weeks ago Recurrent UTI   [facility] [place], [place], PA-C   2 months ago Type 2 diabetes mellitus with stage 3a chronic kidney disease, without long-term current use of insulin [facility])   [facility] [name], MD   3 months ago Helicobacter pylori gastritis   [facility] [name], MD   4 months ago Helicobacter pylori gastritis   [employer], Kirstie Peri, MD       Future Appointments             In 1 week Fisher, Kirstie Peri, MD [facility], [place]   In 1 month Gollan, Kathlene November, MD [facility] [place], [place]   In 4 months [place], Kathlene November, MD [facility] [place], [place]

## 2019-03-01 DIAGNOSIS — R002 Palpitations: Secondary | ICD-10-CM | POA: Diagnosis not present

## 2019-03-01 IMAGING — CT CT HEAD W/O CM
3 series · 15 of 47 positions shown, 18 images · non-contrast
Comparison: 02/16/2015

CLINICAL DATA: Slurred speech and unsteady gait. Altered
consciousness.

EXAM:
CT HEAD WITHOUT CONTRAST
TECHNIQUE: Contiguous axial images were obtained from the base of the skull
through the vertex without intravenous contrast.

[Series 2: head wo · axial · 0.40mm/px · z∈[-99,+26]mm · 9 of 30 slices shown, 12 images]
[im 3/30  brain]
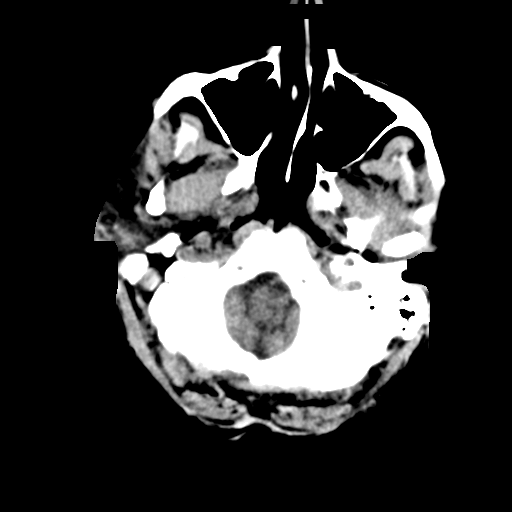
[im 3/30  bone]
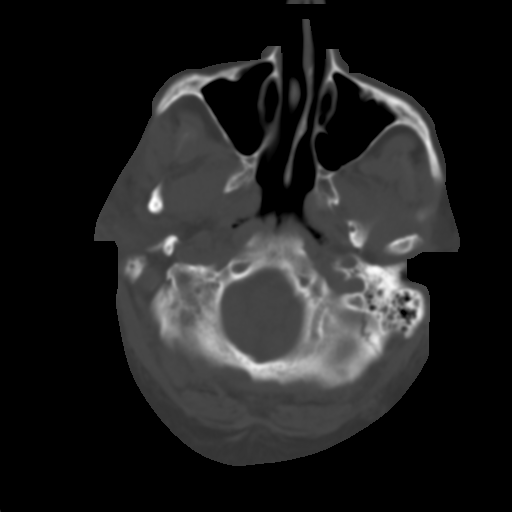
[im 6/30  brain]
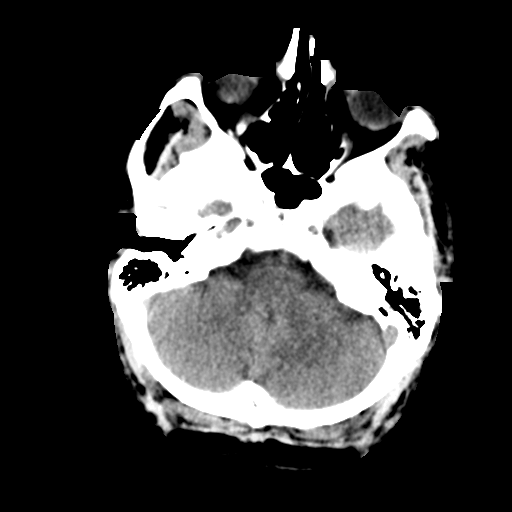
[im 9/30  brain]
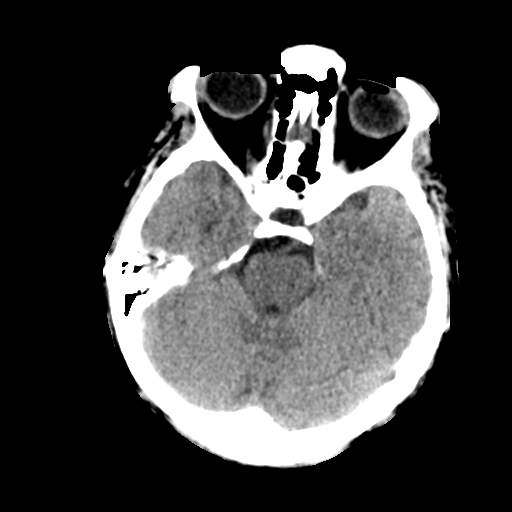
[im 12/30  brain]
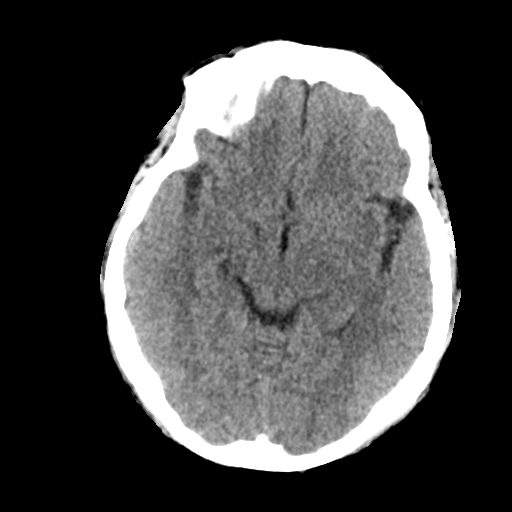
[im 16/30  brain]
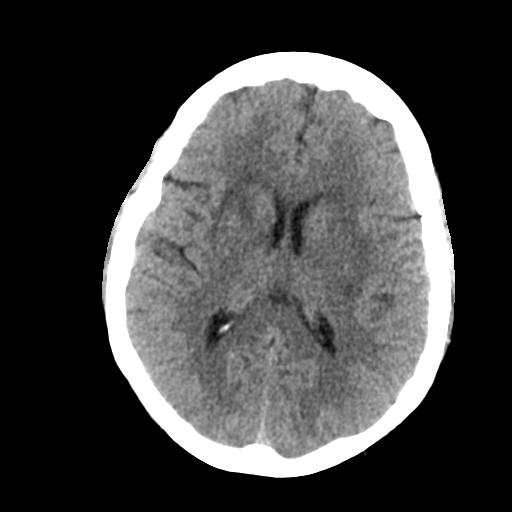
[im 16/30  bone]
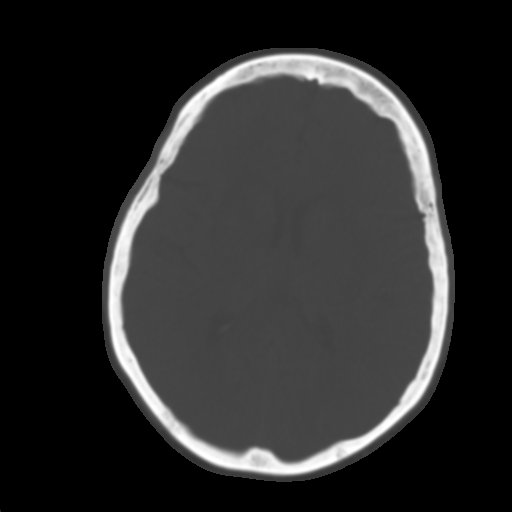
[im 19/30  brain]
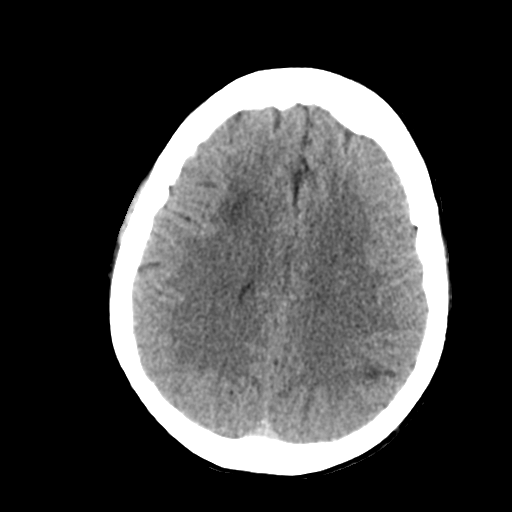
[im 22/30  brain]
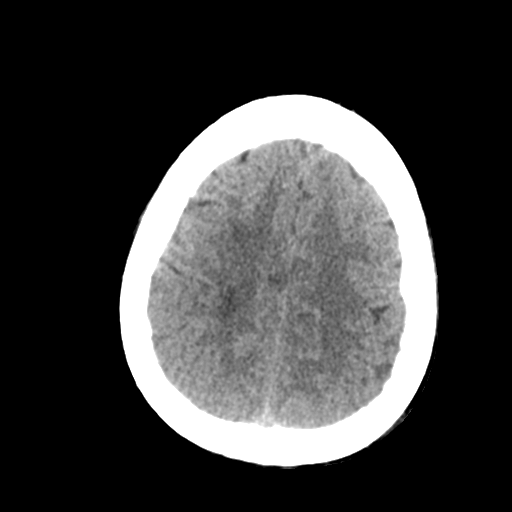
[im 25/30  brain]
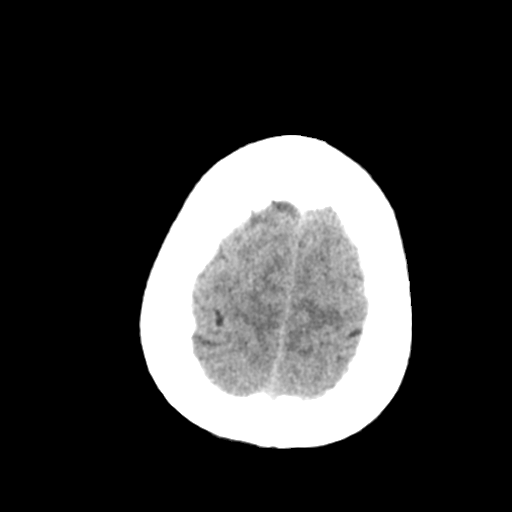
[im 28/30  brain]
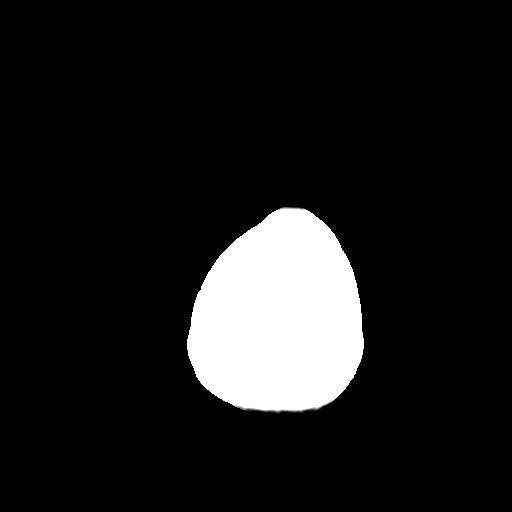
[im 28/30  bone]
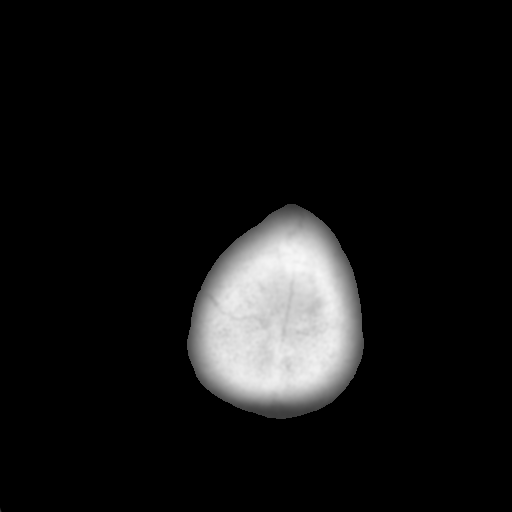

[Series 4: coronal soft tissue · coronal · 0.31mm/px · 3 of 68 slices shown]
[im 23/68  brain]
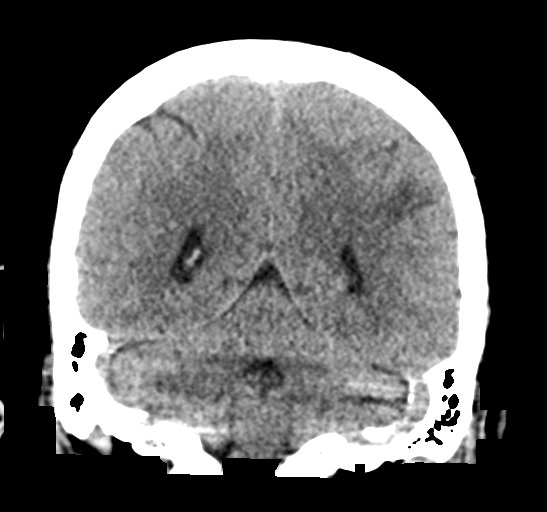
[im 30/68  brain]
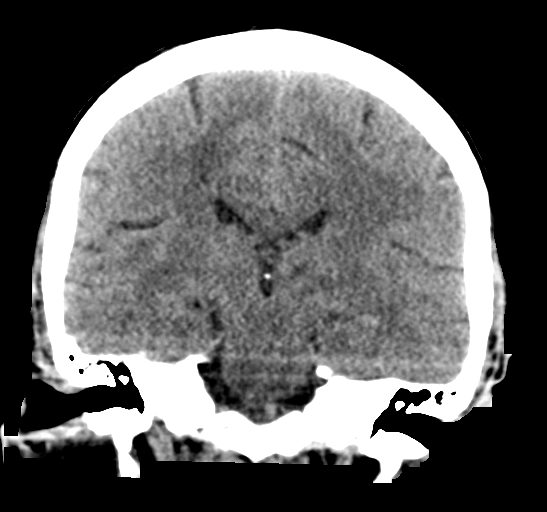
[im 38/68  brain]
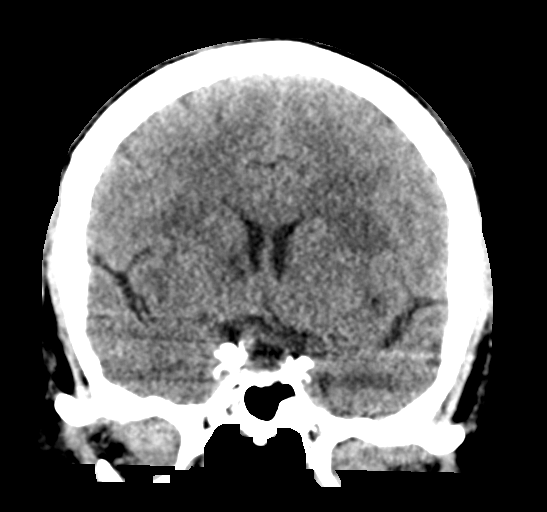

[Series 5: sagittal soft tissue · sagittal · 0.30mm/px · 3 of 57 slices shown]
[im 19/57  brain]
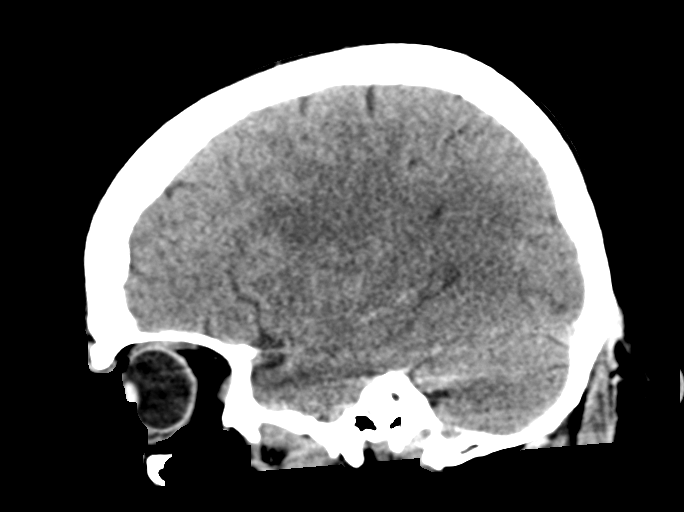
[im 29/57  brain]
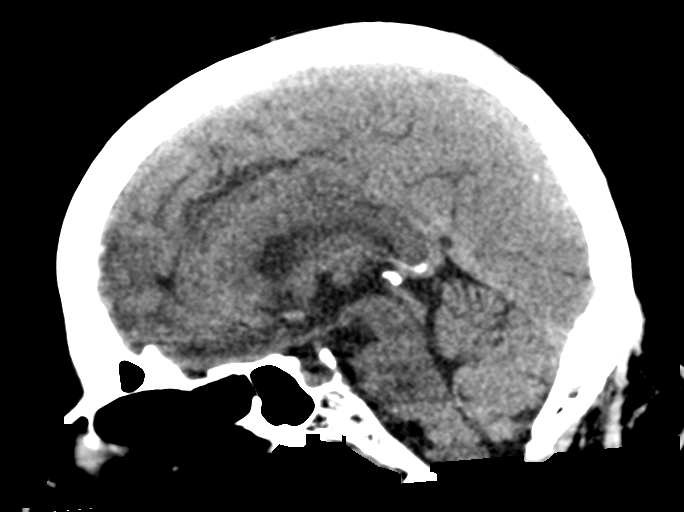
[im 38/57  brain]
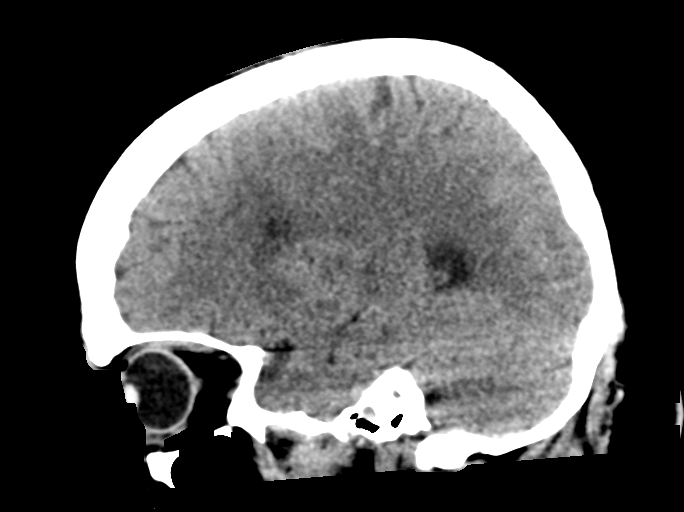

[15 of 47 positions shown; findings below may reference images not displayed]

FINDINGS: Brain: Cerebral atrophy which is age advanced. Right basal ganglia
lacunar infarct is favored to be remote but new since 02/16/2015.
Example image 15/series 2. There is vague periventricular white
matter hypoattenuation, including on images 18 and 19/series 2. This
is felt to be similar to on the prior. More diffuse low density in
the periventricular white matter likely related to small vessel
disease.

No mass lesion, hemorrhage, hydrocephalus, acute infarct,
intra-axial, or extra-axial fluid collection.

Vascular: Bilateral vertebral artery atherosclerosis which is
significantly age advanced. There is also right internal carotid
atherosclerosis.

Skull: Normal

Sinuses/Orbits: Normal imaged portions of the orbits and globes.
Clear paranasal sinuses and mastoid air cells.

Other: None.
IMPRESSION: 1.  No acute intracranial abnormality.
2. Cerebral atrophy with mild small vessel ischemic change. Areas of
more focal hypoattenuation in the right frontal white matter are
felt to be similar to the prior exam and likely related to remote
ischemia.
3. Age advanced intracranial atherosclerosis.
4. Remote but interval right basal ganglia lacunar infarct.

## 2019-03-01 IMAGING — DX DG CHEST 1V PORT
1 series · 1 of 1 positions shown · non-contrast
Comparison: 01/27/2017

CLINICAL DATA: Weakness

EXAM:
PORTABLE CHEST 1 VIEW

[chest ap]
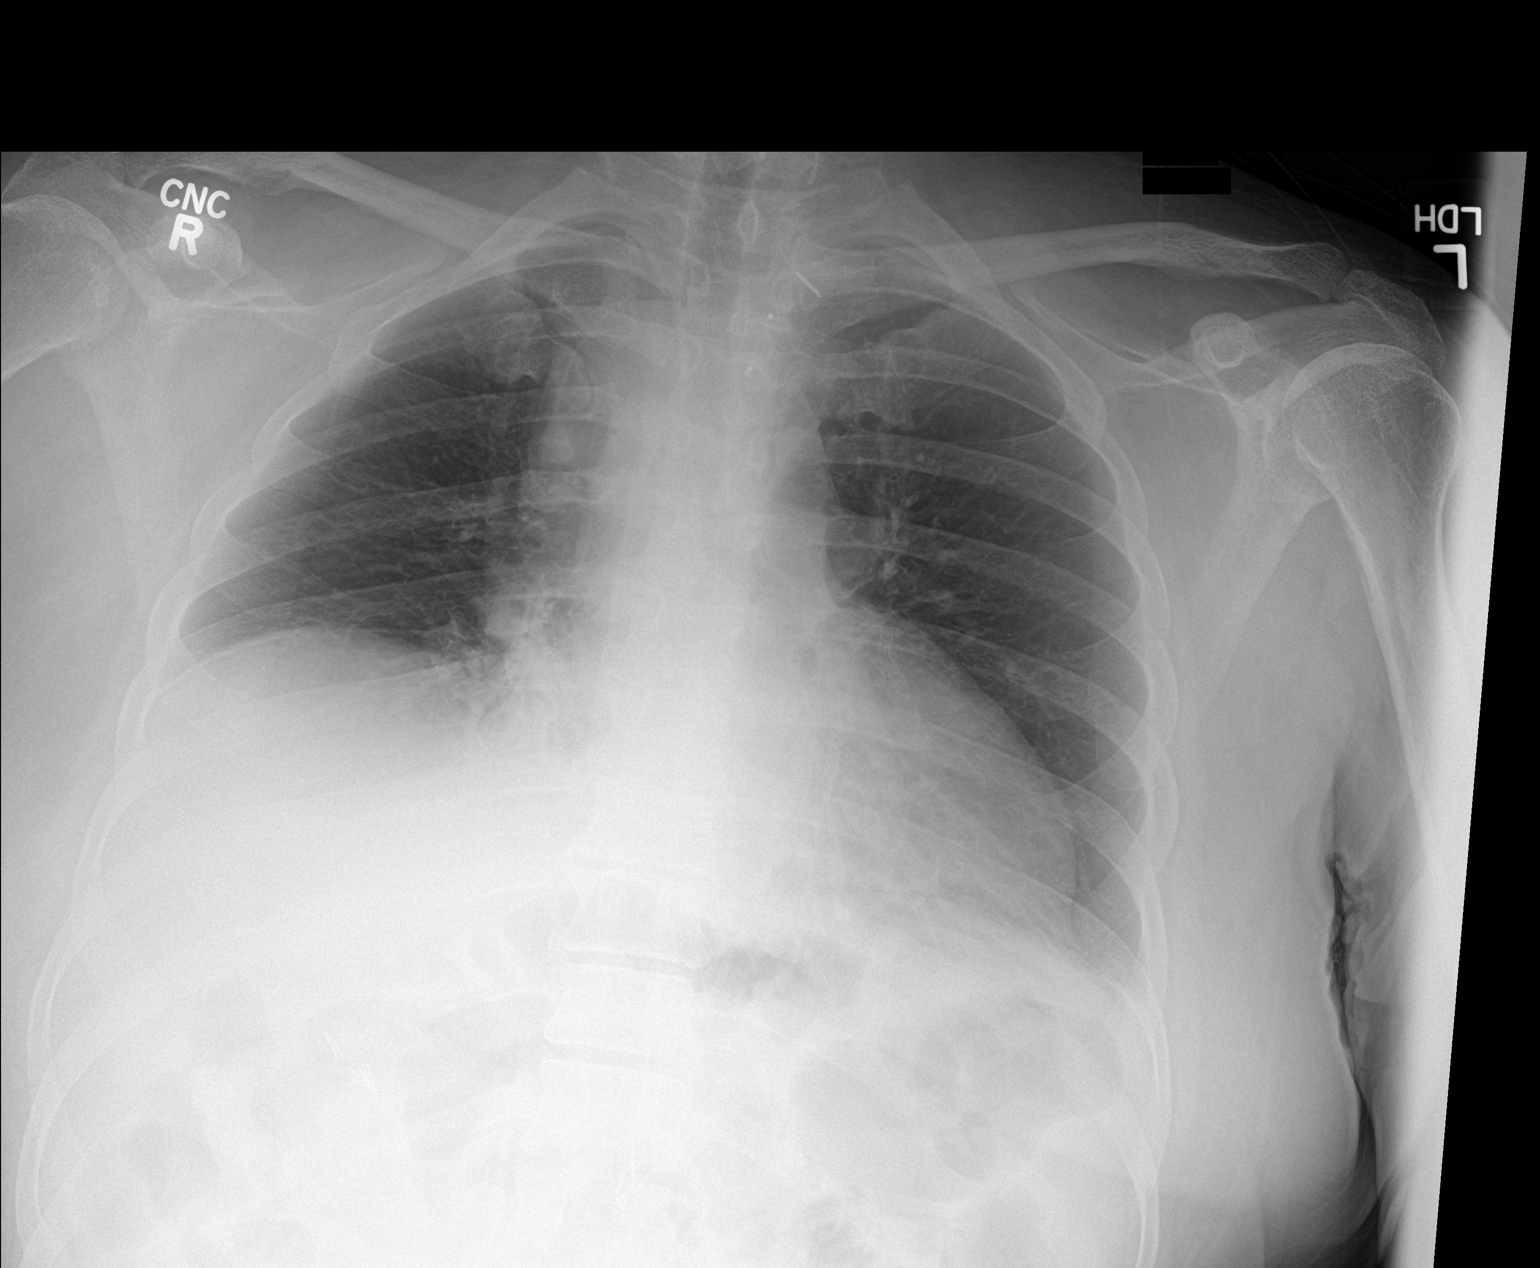

[1 of 1 positions shown; findings below may reference images not displayed]

FINDINGS: Cardiac shadow is stable in appearance. The overall inspiratory
effort is poor with elevation the right hemidiaphragm. Additionally
right basilar infiltrate is noted medially. No bony abnormality is
noted.
IMPRESSION: Right basilar infiltrate with hypoinflation

## 2019-03-02 IMAGING — CT CT ABD-PELV W/O CM
2 of 4 series · 15 of 46 positions shown, 17 images · non-contrast
Comparison: CT abdomen and pelvis 01/27/2017

CLINICAL DATA: Shoulder surgery 4 days ago. Unable to urinate.
Hydronephrosis.

EXAM:
CT ABDOMEN AND PELVIS WITHOUT CONTRAST
TECHNIQUE: Multidetector CT imaging of the abdomen and pelvis was performed
following the standard protocol without IV contrast.

[Series 2: axial st · axial · 0.77mm/px · z∈[-535,-20]mm · 12 of 113 slices shown, 14 images]
[im 5/113  soft-tissue]
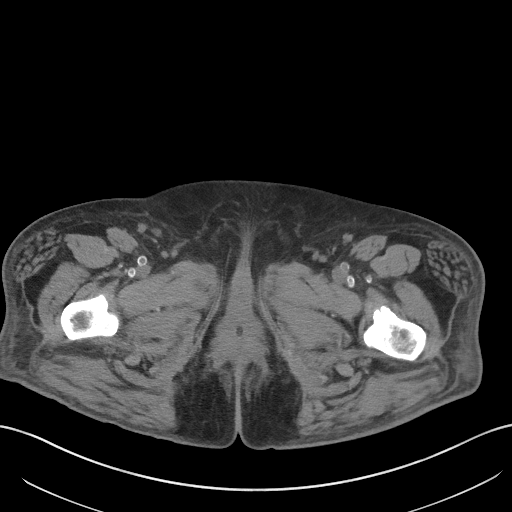
[im 5/113  bone]
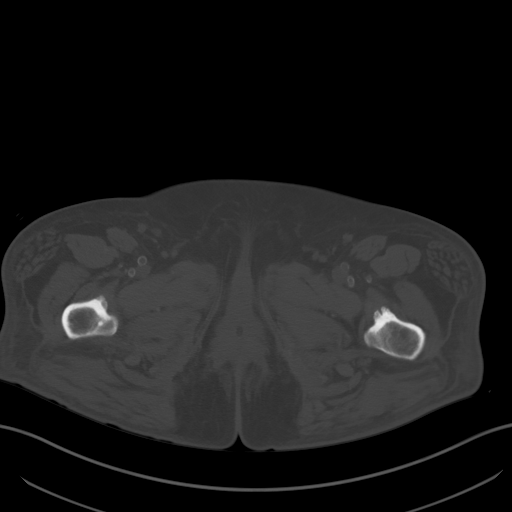
[im 15/113  soft-tissue]
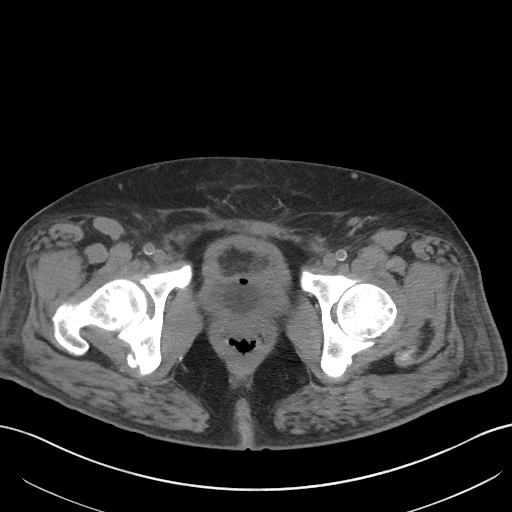
[im 24/113  soft-tissue]
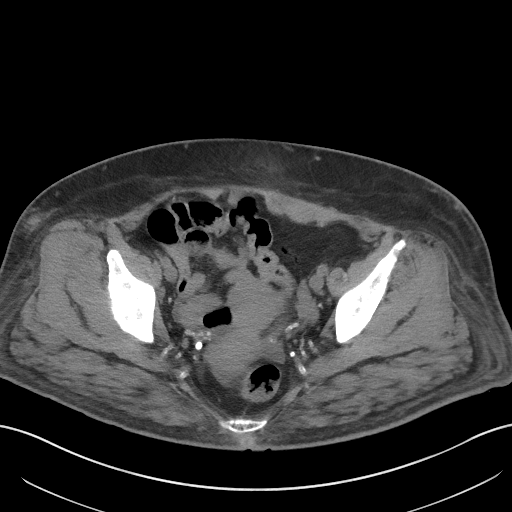
[im 33/113  soft-tissue]
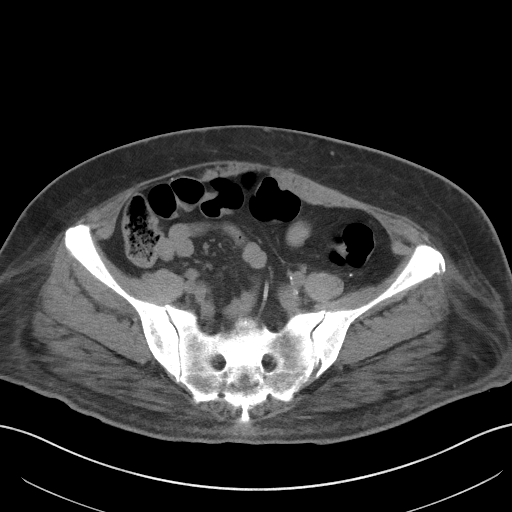
[im 43/113  soft-tissue]
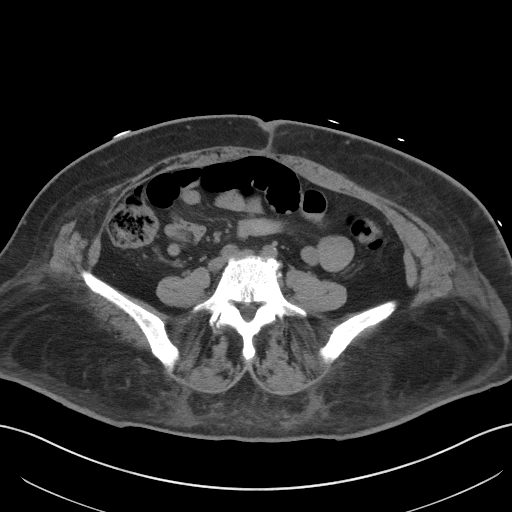
[im 52/113  soft-tissue]
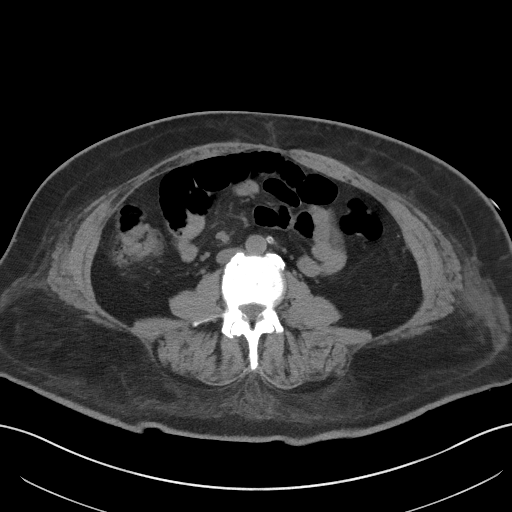
[im 61/113  soft-tissue]
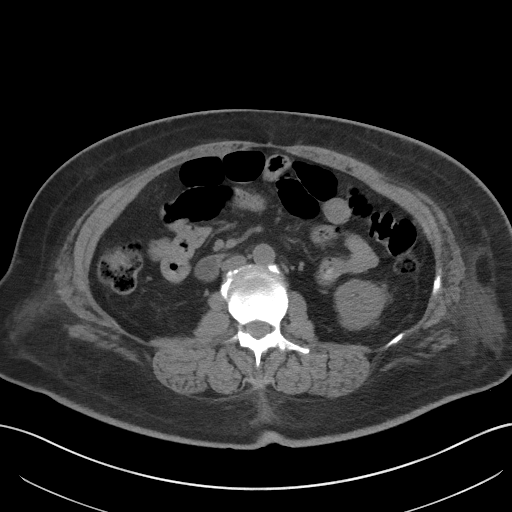
[im 71/113  soft-tissue]
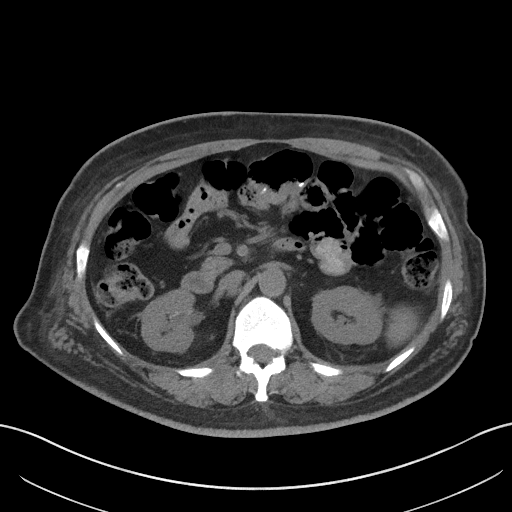
[im 80/113  soft-tissue]
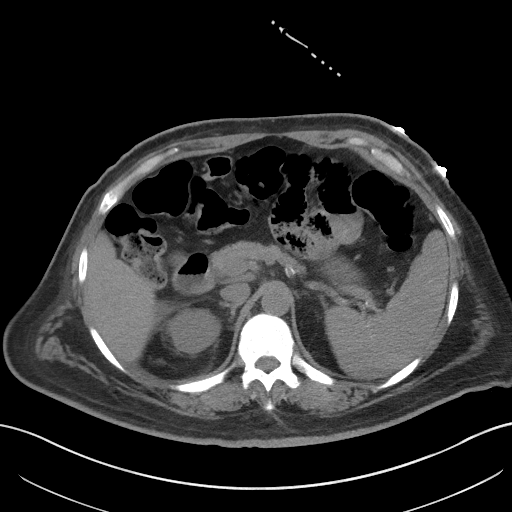
[im 80/113  bone]
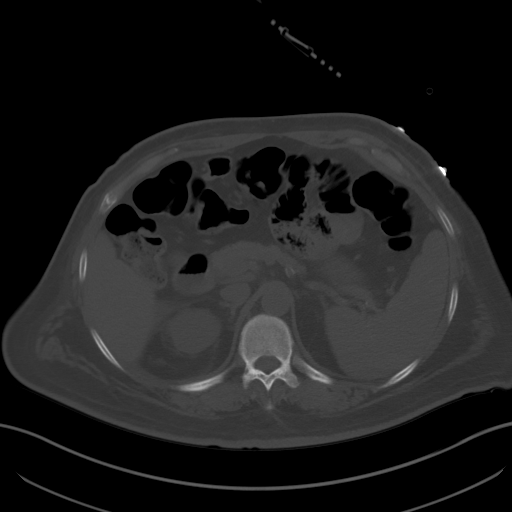
[im 89/113  soft-tissue]
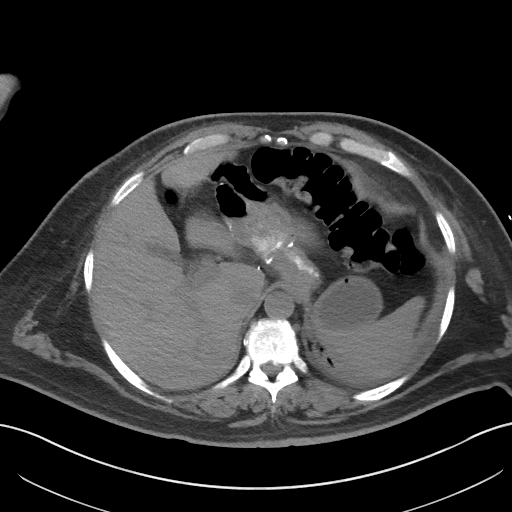
[im 99/113  soft-tissue]
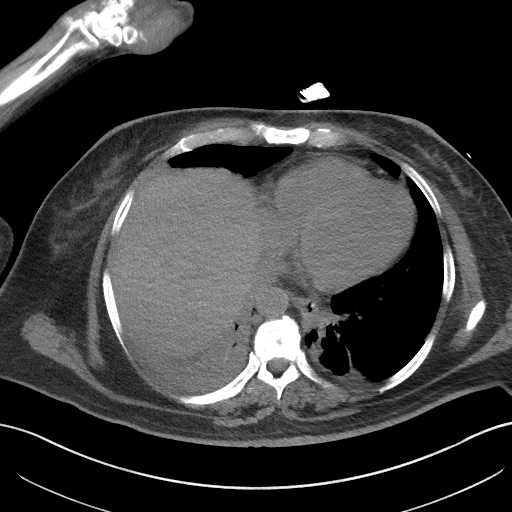
[im 108/113  soft-tissue]
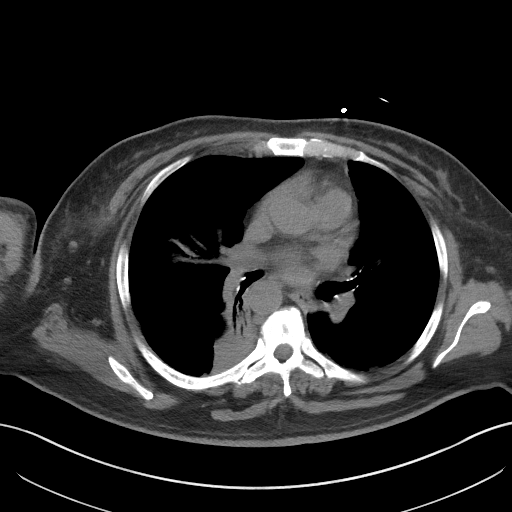

[Series 5: coronal st · coronal · 0.79mm/px · 3 of 88 slices shown]
[im 30/88  soft-tissue]
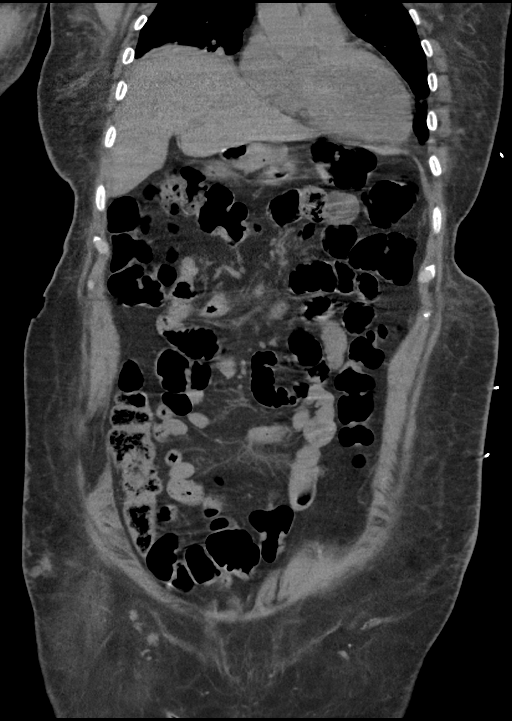
[im 39/88  soft-tissue]
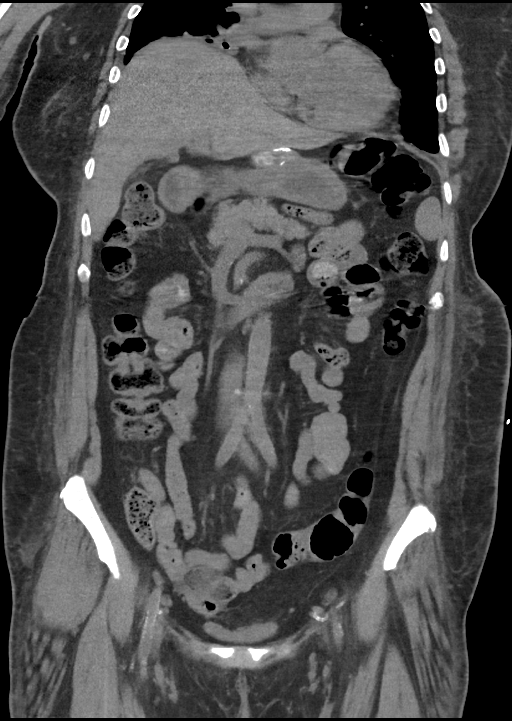
[im 49/88  soft-tissue]
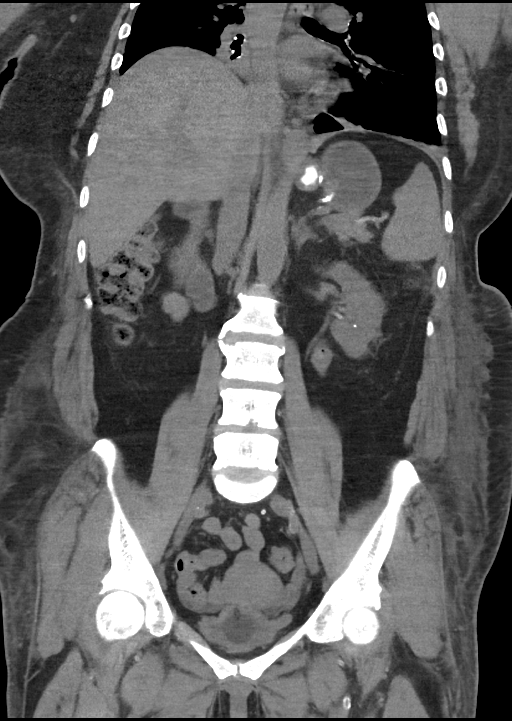

[15 of 46 positions shown; findings below may reference images not displayed]

FINDINGS: Lower chest: Inferior medial lower lobe airspace disease is present
bilaterally, right greater than left. There is scattered disease in
the posterior lingula. The heart size is normal. Coronary artery
calcifications are present. No significant pleural or pericardial
effusion is present.

Hepatobiliary: No focal liver abnormality is seen. Status post
cholecystectomy. No biliary dilatation.

Pancreas: Unremarkable. No pancreatic ductal dilatation or
surrounding inflammatory changes.

Spleen: Normal in size without focal abnormality.

Adrenals/Urinary Tract: Adrenal glands are normal bilaterally. A
punctate left lower pole kidney stone is present. There is no
hydronephrosis. Ureters are within normal limits. A Foley catheter
is present within the urinary bladder.

Stomach/Bowel: Gastric bypass surgery is noted. Stomach and duodenum
are otherwise within normal limits. Small bowel is unremarkable. The
terminal ileum is within normal limits. The appendix is not
discretely visualized and may be surgically absent. The ascending
and transverse colon are within normal limits. Descending and
sigmoid colon are normal.

Vascular/Lymphatic: Extensive vascular calcifications are present.
No significant adenopathy is present.

Reproductive: Uterus and bilateral adnexa are unremarkable.

Other: No abdominal wall hernia or abnormality. No abdominopelvic
ascites.

Musculoskeletal: Subcutaneous edema is most prominent posteriorly.
Calcified L4-5 disc is again seen. Vertebral body heights and
alignment are maintained. Schmorl's nodes are evident. The bony
pelvis is intact. The hips are located and within normal limits
bilaterally.
IMPRESSION: 1. Stable punctate nonobstructing stone at the lower pole left
kidney.
2. Foley catheter in place without urinary tract obstruction. New
bilateral medial airspace disease, right greater than left. This is
most concerning for pneumonia. Aspiration is also considered.

## 2019-03-03 ENCOUNTER — Other Ambulatory Visit: Payer: Self-pay | Admitting: Cardiovascular Disease

## 2019-03-03 MED ORDER — ISOSORBIDE MONONITRATE ER 60 MG PO TB24: 30.0000 mg | ORAL_TABLET | Freq: Every day | ORAL | 3 refills | Status: DC

## 2019-03-03 NOTE — Telephone Encounter (Signed)
*  STAT* If patient is at the pharmacy, call can be transferred to refill team.   1. Which medications need to be refilled? (please list name of each medication and dose if known) Isosorbide 60 mg po q d   2. Which pharmacy/location (including street and city if local pharmacy) is medication to be sent to? [facility] [address]   3. Do they need a 30 day or 90 day supply?  [place]

## 2019-03-06 DIAGNOSIS — S72142A Displaced intertrochanteric fracture of left femur, initial encounter for closed fracture: Secondary | ICD-10-CM | POA: Diagnosis not present

## 2019-03-06 DIAGNOSIS — S7002XA Contusion of left hip, initial encounter: Secondary | ICD-10-CM | POA: Insufficient documentation

## 2019-03-06 DIAGNOSIS — M79605 Pain in left leg: Secondary | ICD-10-CM | POA: Diagnosis not present

## 2019-03-08 DIAGNOSIS — G5603 Carpal tunnel syndrome, bilateral upper limbs: Secondary | ICD-10-CM | POA: Diagnosis not present

## 2019-03-08 DIAGNOSIS — G5600 Carpal tunnel syndrome, unspecified upper limb: Secondary | ICD-10-CM | POA: Diagnosis not present

## 2019-03-10 ENCOUNTER — Other Ambulatory Visit: Payer: Self-pay

## 2019-03-10 ENCOUNTER — Encounter: Payer: Self-pay | Admitting: Family Medicine

## 2019-03-10 ENCOUNTER — Ambulatory Visit (INDEPENDENT_AMBULATORY_CARE_PROVIDER_SITE_OTHER): Payer: Medicare Other | Admitting: Family Medicine

## 2019-03-10 VITALS — BP 100/60 | HR 73 | Temp 96.8°F | Resp 16 | Wt 150.0 lb

## 2019-03-10 DIAGNOSIS — R300 Dysuria: Secondary | ICD-10-CM

## 2019-03-10 DIAGNOSIS — I6529 Occlusion and stenosis of unspecified carotid artery: Secondary | ICD-10-CM

## 2019-03-10 DIAGNOSIS — K529 Noninfective gastroenteritis and colitis, unspecified: Secondary | ICD-10-CM | POA: Diagnosis not present

## 2019-03-10 DIAGNOSIS — R0989 Other specified symptoms and signs involving the circulatory and respiratory systems: Secondary | ICD-10-CM | POA: Diagnosis not present

## 2019-03-10 DIAGNOSIS — F321 Major depressive disorder, single episode, moderate: Secondary | ICD-10-CM

## 2019-03-10 DIAGNOSIS — I471 Supraventricular tachycardia: Secondary | ICD-10-CM

## 2019-03-10 DIAGNOSIS — N390 Urinary tract infection, site not specified: Secondary | ICD-10-CM | POA: Diagnosis not present

## 2019-03-10 DIAGNOSIS — I739 Peripheral vascular disease, unspecified: Secondary | ICD-10-CM | POA: Insufficient documentation

## 2019-03-10 DIAGNOSIS — C519 Malignant neoplasm of vulva, unspecified: Secondary | ICD-10-CM | POA: Insufficient documentation

## 2019-03-10 LAB — POCT URINALYSIS DIPSTICK
Bilirubin, UA: NEGATIVE
Glucose, UA: NEGATIVE
Ketones, UA: NEGATIVE
Nitrite, UA: NEGATIVE
Protein, UA: NEGATIVE
Spec Grav, UA: 1.015 (ref 1.010–1.025)
Urobilinogen, UA: 0.2 E.U./dL
pH, UA: 6 (ref 5.0–8.0)

## 2019-03-10 NOTE — Progress Notes (Signed)
Patient: [name] Female    DOB: Feb 24, 1970   49 y.o.   MRN: [id number] Visit Date: 03/10/2019  Today's Provider: [name], MD   Chief Complaint  Patient presents with  . Blood Pressure Check  . Dysuria   Subjective:     HPI  Hypotension, follow-up:  BP Readings from Last 3 Encounters:  03/10/19 100/60  02/19/19 127/69  02/16/19 106/71    She was last seen for hypotension 1 months ago.  BP at that visit was 93/54. Management since that visit includes advising patient to check blood pressure every morning. If below 100/60 then only take 5mg  of amlodipine instead of 10mg . She has still has a few bottles of 5mg  tablets and is taking one every day She reports good compliance with treatment. She is not having side effects.  She is not exercising.   Outside blood pressures are 100/60. She is experiencing none.  Patient denies chest pain, chest pressure/discomfort, claudication, dyspnea, exertional chest pressure/discomfort, fatigue, irregular heart beat, lower extremity edema, near-syncope, orthopnea, palpitations, paroxysmal nocturnal dyspnea, syncope and tachypnea.      Weight trend: stable Wt Readings from Last 3 Encounters:  03/10/19 150 lb (68 kg)  02/18/19 150 lb (68 kg)  02/16/19 152 lb 12 oz (69.3 kg)    Current diet: well balanced  ------------------------------------------------------------------------  Follow up for Insomnia:  The patient was last seen for this 1 months ago. Changes made at last visit include prescribing Belsomra.  She reports good compliance with treatment. She feels that condition is Worse. Patient states the Belsomra did not work, so she has been taking Xanax to help her sleep.  She is not having side effects.   ------------------------------------------------------------------------------------ Dysuria: Patient complains of burning during urination for the past month. She also complains of dark colored and  foul smelling urine. She was seen in ER about 2 weeks ago with UTI sx and given IV antibiotic, but states sx have not improved.    Allergies  Allergen Reactions  . Lipitor [Atorvastatin] Other (See Comments)    Leg pains  . Tramadol Other (See Comments)    Mouth feels like it's on fire     Current Outpatient Medications:  .  ALPRAZolam (XANAX) 1 MG tablet, TAKE 1 TABLET BY MOUTH ONCE OR TWICE A DAY FOR ANXIETY, Disp: 45 tablet, Rfl: 3 .  buPROPion (WELLBUTRIN XL) 150 MG 24 hr tablet, TAKE 1 TABLET BY MOUTH EVERY DAY (Patient taking differently: Take 150 mg by mouth daily. ), Disp: 90 tablet, Rfl: 4 .  clopidogrel (PLAVIX) 75 MG tablet, Take 1 tablet (75 mg total) by mouth daily., Disp: 90 tablet, Rfl: 3 .  diphenoxylate-atropine (LOMOTIL) 2.5-0.025 MG tablet, Take 1-4 tablets by mouth daily. Take one capsule 1-4 times daily for diarrhea, Disp: 30 tablet, Rfl: 5 .  gabapentin (NEURONTIN) 300 MG capsule, Take 1 capsule (300 mg total) by mouth 2 (two) times daily., Disp: 180 capsule, Rfl: 4 .  Galcanezumab-gnlm (EMGALITY) 120 MG/ML SOAJ, INJECT 240 MG SUBCUTANEOUSLY AS DIRECTED FOR THE FIRST MONTH., Disp: , Rfl:  .  isosorbide mononitrate (IMDUR) 60 MG 24 hr tablet, Take 0.5 tablets (30 mg total) by mouth daily., Disp: 15 tablet, Rfl: 3 .  levothyroxine (SYNTHROID, LEVOTHROID) 25 MCG tablet, Take 1 tablet (25 mcg total) by mouth daily before breakfast., Disp: 30 tablet, Rfl: 12 .  nitroGLYCERIN (NITROSTAT) 0.4 MG SL tablet, Place 1 tablet (0.4 mg total) under the tongue every 5 (  five) minutes as needed for chest pain., Disp: 25 tablet, Rfl: 2 .  pantoprazole (PROTONIX) 40 MG tablet, Take 1 tablet (40 mg total) by mouth 2 (two) times daily., Disp: 60 tablet, Rfl: 5 .  promethazine (PHENERGAN) 25 MG tablet, Take 1 tablet (25 mg total) by mouth every 6 (six) hours as needed for nausea or vomiting., Disp: 30 tablet, Rfl: 5 .  rosuvastatin (CRESTOR) 40 MG tablet, Take 1 tablet (40 mg total) by mouth  daily., Disp: 90 tablet, Rfl: 4 .  venlafaxine XR (EFFEXOR-XR) 75 MG 24 hr capsule, Take 1 capsule (75 mg total) by mouth daily with breakfast., Disp: 30 capsule, Rfl: 5 .  amLODipine (NORVASC) 10 MG tablet, Take 1 tablet (10 mg total) by mouth daily. (Patient taking differently: Take 5 mg by mouth daily. ), Disp: 30 tablet, Rfl: 0 .  losartan (COZAAR) 50 MG tablet, Take 1 tablet (50 mg total) by mouth daily., Disp: 30 tablet, Rfl: 0 .  Suvorexant (BELSOMRA) 10 MG TABS, Take 10 mg by mouth at bedtime as needed. (Patient not taking: Reported on 03/10/2019), Disp: 30 tablet, Rfl: 2  Review of Systems  Constitutional: Negative for appetite change, chills, fatigue and fever.  Respiratory: Negative for chest tightness and shortness of breath.   Cardiovascular: Negative for chest pain and palpitations.  Gastrointestinal: Negative for abdominal pain, nausea and vomiting.  Genitourinary: Positive for dysuria and urgency.  Neurological: Positive for dizziness and light-headedness. Negative for weakness.    Social History   Tobacco Use  . Smoking status: Former Smoker    Types: Cigarettes    Quit date: 08/31/1994    Years since quitting: 24.5  . Smokeless tobacco: Never Used  . Tobacco comment: quit 28 years ago  Substance Use Topics  . Alcohol use: No    Alcohol/week: 0.0 standard drinks      Objective:   BP 100/60 (BP Location: Left Arm, Patient Position: Sitting, Cuff Size: Normal)   Pulse 73   Temp (!) 96.8 F (36 C) (Temporal)   Resp 16   Wt 150 lb (68 kg)   SpO2 98% Comment: room air  BMI 27.44 kg/m  Vitals:   03/10/19 0855  BP: 100/60  Pulse: 73  Resp: 16  Temp: (!) 96.8 F (36 C)  TempSrc: Temporal  SpO2: 98%  Weight: 150 lb (68 kg)  Body mass index is 27.44 kg/m.   Physical Exam  General appearance:  Overweight female, cooperative and in no acute distress Head: Normocephalic, without obvious abnormality, atraumatic Respiratory: Respirations even and unlabored,  normal respiratory rate Extremities: All extremities are intact.  Skin: Skin color, texture, turgor normal. No rashes seen  Psych: Appropriate mood and affect. Neurologic: Mental status: Alert, oriented to person, place, and time, thought content appropriate.  Results for orders placed or performed in visit on 03/10/19  POCT Urinalysis Dipstick  Result Value Ref Range   Color, UA yellow    Clarity, UA cloudy    Glucose, UA Negative Negative   Bilirubin, UA negative    Ketones, UA negative    Spec Grav, UA 1.015 1.010 - 1.025   Blood, UA trace ( non-hemolyzed)    pH, UA 6.0 5.0 - 8.0   Protein, UA Negative Negative   Urobilinogen, UA 0.2 0.2 or 1.0 E.U./dL   Nitrite, UA negative    Leukocytes, UA Moderate (2+) (A) Negative   Appearance     Odor         Assessment & Plan  1. Dysuria Persistent sx for the last month. Send for culture before deciding on antibiotic regigemetn  2. Recurrent UTI   3. Urinary tract infection without hematuria, site unspecified  - Urine Culture  4. Labile blood pressure Fairly well controlled with reduced dose of amlodipine, will continue 5mg  daily.   5. Depression, major, single episode, moderate (HCC) Stable on current medication regiment.   6. Paroxysmal supraventricular tachycardia (HCC) Controlled on metoprolol. Follow up Dr. [name] next week as scheduled.   7. Chronic diarrhea Unchanged. No recent GI workup. Scheduled to see for evaluation next week.     [name], MD  [facility]

## 2019-03-12 DIAGNOSIS — R002 Palpitations: Secondary | ICD-10-CM | POA: Diagnosis not present

## 2019-03-12 LAB — URINE CULTURE

## 2019-03-13 ENCOUNTER — Other Ambulatory Visit: Payer: Self-pay

## 2019-03-13 NOTE — Telephone Encounter (Signed)
Attempted to contact patient, no answer and voicemail is unavailable.

## 2019-03-13 NOTE — Telephone Encounter (Signed)
-----   Message from [name], MD sent at 03/13/2019 12:42 PM EST ----- Urine culture grew streptococcus. This is always sensitive to penicillin and amoxicillin. Please send prescription for amoxicillin 500mg  two tablets twice daily for 10 days.

## 2019-03-14 ENCOUNTER — Encounter: Payer: Self-pay | Admitting: Family Medicine

## 2019-03-14 DIAGNOSIS — Z9884 Bariatric surgery status: Secondary | ICD-10-CM | POA: Diagnosis not present

## 2019-03-14 DIAGNOSIS — N184 Chronic kidney disease, stage 4 (severe): Secondary | ICD-10-CM | POA: Diagnosis not present

## 2019-03-14 DIAGNOSIS — F333 Major depressive disorder, recurrent, severe with psychotic symptoms: Secondary | ICD-10-CM

## 2019-03-14 DIAGNOSIS — E1142 Type 2 diabetes mellitus with diabetic polyneuropathy: Secondary | ICD-10-CM | POA: Diagnosis not present

## 2019-03-14 DIAGNOSIS — E663 Overweight: Secondary | ICD-10-CM | POA: Diagnosis not present

## 2019-03-14 DIAGNOSIS — E1122 Type 2 diabetes mellitus with diabetic chronic kidney disease: Secondary | ICD-10-CM | POA: Diagnosis not present

## 2019-03-14 DIAGNOSIS — I10 Essential (primary) hypertension: Secondary | ICD-10-CM | POA: Diagnosis not present

## 2019-03-14 DIAGNOSIS — E782 Mixed hyperlipidemia: Secondary | ICD-10-CM | POA: Diagnosis not present

## 2019-03-14 DIAGNOSIS — E039 Hypothyroidism, unspecified: Secondary | ICD-10-CM | POA: Diagnosis not present

## 2019-03-14 MED ORDER — AMOXICILLIN 500 MG PO CAPS: 500.0000 mg | ORAL_CAPSULE | Freq: Two times a day (BID) | ORAL | 0 refills | Status: DC

## 2019-03-14 NOTE — Telephone Encounter (Signed)
Patient advised. RX sent to [place]

## 2019-03-15 ENCOUNTER — Other Ambulatory Visit: Payer: Self-pay

## 2019-03-15 ENCOUNTER — Ambulatory Visit (INDEPENDENT_AMBULATORY_CARE_PROVIDER_SITE_OTHER): Payer: Medicare Other | Admitting: Gastroenterology

## 2019-03-15 ENCOUNTER — Encounter: Payer: Self-pay | Admitting: Gastroenterology

## 2019-03-15 VITALS — BP 129/81 | HR 62 | Temp 97.5°F | Ht 62.0 in | Wt 148.0 lb

## 2019-03-15 DIAGNOSIS — I6529 Occlusion and stenosis of unspecified carotid artery: Secondary | ICD-10-CM | POA: Diagnosis not present

## 2019-03-15 DIAGNOSIS — R1319 Other dysphagia: Secondary | ICD-10-CM

## 2019-03-15 DIAGNOSIS — G5603 Carpal tunnel syndrome, bilateral upper limbs: Secondary | ICD-10-CM | POA: Diagnosis not present

## 2019-03-15 DIAGNOSIS — R1310 Dysphagia, unspecified: Secondary | ICD-10-CM | POA: Diagnosis not present

## 2019-03-15 DIAGNOSIS — R197 Diarrhea, unspecified: Secondary | ICD-10-CM | POA: Diagnosis not present

## 2019-03-15 MED ORDER — BUPROPION HCL ER (XL) 300 MG PO TB24: 300.0000 mg | ORAL_TABLET | Freq: Every day | ORAL | 1 refills | Status: DC

## 2019-03-15 NOTE — H&P (View-Only) (Signed)
Primary Care Physician: [name], MD  Primary Gastroenterologist:  Dr. [name]  Chief Complaint  Patient presents with  . Diarrhea    HPI: [name] is a 49 y.o. female here with a history of seeing me back in March 2018 for acid reflux and then followed up with the [facility] clinic with an EGD and colonoscopy by Dr. Alice Reichert in December 2019 with no findings to explain her symptoms.  The patient was having diarrhea and dysphagia.  The patient also had a modified barium swallow that reported a mid esophageal ring that was not commented on during the upper endoscopy.  The patient now reports that she is having chronic vomiting and nausea but despite this has gained approximately 20 pounds since I had seen her in 2018.  The patient is status post gastric bypass surgery. The patient reports that she was not happy with the answer she was getting from her previous gastroenterologist that she was still having symptoms.  There is no report of any bloody stools or black stools.  The patient reports that she has been taking Lomotil 4 tablets every morning and states that she can sometimes have diarrhea by the afternoon.  Past Medical History:  Diagnosis Date  . Acute pyelonephritis   . Anemia    iron deficiency anemia  . Aortic arch aneurysm ([place])   . Bipolar disorder ([place])   . BRCA negative 2014  . CAD (coronary artery disease)    a. 08/2003 Cath: LAD 30-40-med Rx; b. 11/2014 PCI: LAD 10m(3.25x23 Xience Alpine DES); c. 06/2015 PCI: D1 (2.25x12 Resolute Integrity DES); d. 06/2017 PCI: Patent mLAD stent, D2 95 (PTCA); e. 09/2017 PCI: D2 99ost (CBA); d. 12/2017 Cath: LM nl, LAD 320m80d (small), D1 40ost, D2 95ost, LCX 40p, RCA 40ost/p->Med rx for D2 given restenosis.  . CKD (chronic kidney disease), stage III   . Colon polyp   . CVA (cerebral vascular accident) ([facility]   Left side weakness.   . Diabetes ([facility]  . Family history of breast cancer    BRCA neg 2014  . Gastric  ulcer 04/27/2011  . History of echocardiogram    a. 03/2017 Echo: EF 60-65%, no rwma; b. 02/2018 Echo: EF 60-65%, no rwma. Nl RV fxn. No cardiac source of emboli (admitted w/ stroke).  . [place] (hypertension)   . Hyperlipemia   . Hypothyroid   . Malignant melanoma of skin of scalp ([facility]  . MI, acute, non ST segment elevation ([facility] [place]  . Neuromuscular disorder ([facility]  . Orthostatic hypotension   . S/P drug eluting coronary stent placement 06/04/2015  . Sepsis ([facility]2/14/2019  . [facility]   a. 02/2018 MRI: 12m41mate acute/early subacute L medial frontal lobe inarct; b. 02/2018 MRA No large vessel occlusion or aneurysm. Mod to sev L P2 stenosis. thready L vertebral artery, diffusely dzs'd; c. 02/2018 Carotid U/S: <50% bilat ICA dzs.    Current Outpatient Medications  Medication Sig Dispense Refill  . ALPRAZolam (XANAX) 1 MG tablet TAKE 1 TABLET BY MOUTH ONCE OR TWICE A DAY FOR ANXIETY 45 tablet 3  . amoxicillin (AMOXIL) 500 MG capsule Take 1 capsule (500 mg total) by mouth 2 (two) times daily. 20 capsule 0  . aspirin 81 MG EC tablet Take by mouth.    . clopidogrel (PLAVIX) 75 MG tablet Take 1 tablet (75 mg total) by mouth daily. 90 tablet 3  . diphenoxylate-atropine (LOMOTIL) 2.5-0.025 MG  tablet Take 1-4 tablets by mouth daily. Take one capsule 1-4 times daily for diarrhea 30 tablet 5  . furosemide (LASIX) 20 MG tablet     . gabapentin (NEURONTIN) 300 MG capsule Take 1 capsule (300 mg total) by mouth 2 (two) times daily. 180 capsule 4  . Galcanezumab-gnlm (EMGALITY) 120 MG/ML SOAJ INJECT 240 MG SUBCUTANEOUSLY AS DIRECTED FOR THE FIRST MONTH.    . isosorbide mononitrate (IMDUR) 60 MG 24 hr tablet Take 0.5 tablets (30 mg total) by mouth daily. 15 tablet 3  . levothyroxine (SYNTHROID, LEVOTHROID) 25 MCG tablet Take 1 tablet (25 mcg total) by mouth daily before breakfast. 30 tablet 12  . metFORMIN (GLUCOPHAGE-XR) 500 MG 24 hr tablet Take 500 mg by mouth daily.    . metoprolol succinate (TOPROL-XL) 25  MG 24 hr tablet Take 25 mg by mouth daily.    . nitroGLYCERIN (NITROSTAT) 0.4 MG SL tablet Place 1 tablet (0.4 mg total) under the tongue every 5 (five) minutes as needed for chest pain. 25 tablet 2  . pantoprazole (PROTONIX) 40 MG tablet Take 1 tablet (40 mg total) by mouth 2 (two) times daily. 60 tablet 5  . promethazine (PHENERGAN) 25 MG tablet Take 1 tablet (25 mg total) by mouth every 6 (six) hours as needed for nausea or vomiting. 30 tablet 5  . rosuvastatin (CRESTOR) 40 MG tablet Take 1 tablet (40 mg total) by mouth daily. 90 tablet 4  . traZODone (DESYREL) 100 MG tablet Take 100 mg by mouth at bedtime.    Marland Kitchen venlafaxine XR (EFFEXOR-XR) 75 MG 24 hr capsule Take 1 capsule (75 mg total) by mouth daily with breakfast. 30 capsule 5  . amLODipine (NORVASC) 10 MG tablet Take 1 tablet (10 mg total) by mouth daily. (Patient taking differently: Take 5 mg by mouth daily. ) 30 tablet 0  . buPROPion (WELLBUTRIN XL) 300 MG 24 hr tablet Take 1 tablet (300 mg total) by mouth daily. 90 tablet 1  . losartan (COZAAR) 50 MG tablet Take 1 tablet (50 mg total) by mouth daily. 30 tablet 0   No current facility-administered medications for this visit.    Allergies as of 03/15/2019 - Review Complete 03/15/2019  Allergen Reaction Noted  . Lipitor [atorvastatin] Other (See Comments) 04/14/2017  . Tramadol Other (See Comments) 01/12/2013    ROS:  General: Negative for anorexia, weight loss, fever, chills, fatigue, weakness. ENT: Negative for hoarseness, difficulty swallowing , nasal congestion. CV: Negative for chest pain, angina, palpitations, dyspnea on exertion, peripheral edema.  Respiratory: Negative for dyspnea at rest, dyspnea on exertion, cough, sputum, wheezing.  GI: See history of present illness. GU:  Negative for dysuria, hematuria, urinary incontinence, urinary frequency, nocturnal urination.  Endo: Negative for unusual weight change.    Physical Examination:   BP 129/81   Pulse 62   Temp  (!) 97.5 F (36.4 C) (Oral)   Ht 5' 2" (1.575 m)   Wt 148 lb (67.1 kg)   BMI 27.07 kg/m   General: Well-nourished, well-developed in no acute distress.  Eyes: No icterus. Conjunctivae pink. Lungs: Clear to auscultation bilaterally. Non-labored. Heart: Regular rate and rhythm, no murmurs rubs or gallops.  Abdomen: Bowel sounds are normal, nontender, nondistended, no hepatosplenomegaly or masses, no abdominal bruits or hernia , no rebound or guarding.   Extremities: No lower extremity edema. No clubbing or deformities. Neuro: Alert and oriented x 3.  Grossly intact. Skin: Warm and dry, no jaundice.   Psych: Alert and cooperative, normal mood  and affect.  Labs:    Imaging Studies: CT HEAD WO CONTRAST  Result Date: 02/18/2019 CLINICAL DATA:  49 year old female with focal neurologic deficit. Concern for stroke. EXAM: CT HEAD WITHOUT CONTRAST TECHNIQUE: Contiguous axial images were obtained from the base of the skull through the vertex without intravenous contrast. COMPARISON:  Head CT dated 10/12/2018. FINDINGS: Brain: The ventricles and sulci appropriate size for patient's age. Moderate periventricular and deep white matter chronic microvascular ischemic changes similar in appearance to the prior CT. Small old right basal ganglia lacunar infarcts. There is no acute intracranial hemorrhage. Mass effect or midline shift. No extra-axial fluid collection. Vascular: No hyperdense vessel or unexpected calcification. Skull: Normal. Negative for fracture or focal lesion. Sinuses/Orbits: No acute finding. Other: None IMPRESSION: 1. No acute intracranial pathology. 2. Chronic microvascular ischemic changes similar to prior CT. Electronically Signed   By: Anner Crete M.D.   On: 02/18/2019 17:55   MR BRAIN WO CONTRAST  Result Date: 02/19/2019 CLINICAL DATA:  Left lower extremity weakness. EXAM: MRI HEAD WITHOUT CONTRAST TECHNIQUE: Multiplanar, multiecho pulse sequences of the brain and surrounding  structures were obtained without intravenous contrast. COMPARISON:  CT head without contrast 02/18/2019 and 10/12/2018. MR head without contrast 06/15/2018 FINDINGS: Brain: No acute infarct, hemorrhage, or mass lesion is present. Multiple remote nonhemorrhagic lacunar infarcts are again noted within the basal ganglia. Advanced confluent periventricular white matter disease is present. Remote lacunar infarcts are present in the corona radiata bilaterally. Remote lacunar infarcts are present in the thalami and central pons. No significant extraaxial fluid collection is present. Vascular: Flow is present in the major intracranial arteries. Skull and upper cervical spine: The craniocervical junction is normal. Upper cervical spine is within normal limits. Marrow signal is unremarkable. Sinuses/Orbits: The paranasal sinuses and mastoid air cells are clear. The globes and orbits are within normal limits. IMPRESSION: 1. No acute intracranial abnormality or significant interval change. 2. Multiple remote lacunar infarcts of the basal ganglia and corona radiata bilaterally. 3. Advanced confluent periventricular white matter disease. This likely reflects the sequela of chronic microvascular ischemia. Electronically Signed   By: San Morelle M.D.   On: 02/19/2019 08:14   VAS US CAROTID  Result Date: 02/21/2019 Carotid Arterial Duplex Study Indications:       Carotid artery disease and patient denies any cerebrovascular                    symptoms today. Risk Factors:      Hypertension, hyperlipidemia, Diabetes, past history of                    smoking, prior MI, coronary artery disease, prior CVA. Other Factors:     Stage 3 CKD. Comparison Study:  Prior carotid duplex exam on 06/15/2018 showed velocities in                    right ICA 72/31 cm/s and left ICA 97/32 cm/s. Performing Technologist: Salvadore Dom RVT, RDCS (AE), RDMS  Examination Guidelines: A complete evaluation includes B-mode imaging, spectral  Doppler, color Doppler, and power Doppler as needed of all accessible portions of each vessel. Bilateral testing is considered an integral part of a complete examination. Limited examinations for reoccurring indications may be performed as noted.  Right Carotid Findings: +----------+--------+--------+--------+-------------------+--------+           PSV cm/sEDV cm/sStenosisPlaque Description Comments +----------+--------+--------+--------+-------------------+--------+ CCA Prox  90      19                                          +----------+--------+--------+--------+-------------------+--------+  CCA Distal63      18                                          +----------+--------+--------+--------+-------------------+--------+ ICA Prox  41      14              calcific and smooth         +----------+--------+--------+--------+-------------------+--------+ ICA Mid   62      22      1-39%                               +----------+--------+--------+--------+-------------------+--------+ ICA Distal71      22                                          +----------+--------+--------+--------+-------------------+--------+ ECA       69      14                                          +----------+--------+--------+--------+-------------------+--------+ +----------+--------+-------+----------------+-------------------+           PSV cm/sEDV cmsDescribe        Arm Pressure (mmHG) +----------+--------+-------+----------------+-------------------+ [id number]     11     Multiphasic, [id number]                 +----------+--------+-------+----------------+-------------------+ +---------+--------+--+--------+--+---------+ VertebralPSV cm/s46EDV cm/s14Antegrade +---------+--------+--+--------+--+---------+  Left Carotid Findings: +----------+--------+--------+--------+------------------+---------------+           PSV cm/sEDV cm/sStenosisPlaque DescriptionComments         +----------+--------+--------+--------+------------------+---------------+ CCA Prox  172     10                                                +----------+--------+--------+--------+------------------+---------------+ CCA Distal126     10                                low bifurcation +----------+--------+--------+--------+------------------+---------------+ ICA Prox  57      15                                                +----------+--------+--------+--------+------------------+---------------+ ICA Mid   59      22      1-39%                                     +----------+--------+--------+--------+------------------+---------------+ ICA Distal67      21                                                +----------+--------+--------+--------+------------------+---------------+ ECA       108  10                                                +----------+--------+--------+--------+------------------+---------------+ +----------+--------+--------+----------------+-------------------+           PSV cm/sEDV cm/sDescribe        Arm Pressure (mmHG) +----------+--------+--------+----------------+-------------------+ Subclavian209             Multiphasic, [id number]                 +----------+--------+--------+----------------+-------------------+ +---------+--------+--+--------+---------+ VertebralPSV cm/s15EDV cm/sAntegrade +---------+--------+--+--------+---------+  Summary: Right Carotid: Velocities in the right ICA are consistent with a 1-39% stenosis. Left Carotid: Velocities in the left ICA are consistent with a 1-39% stenosis. Vertebrals:  Bilateral vertebral arteries demonstrate antegrade flow. Subclavians: Normal flow hemodynamics were seen in bilateral subclavian              arteries. *See table(s) above for measurements and observations.  Electronically signed by Ena Dawley MD on 02/21/2019 at 9:40:40 AM.    Final     Assessment and Plan:    [name] is a 49 y.o. y/o female who comes in with chronic diarrhea with dysphagia to pills and rice.  The patient had a modified barium swallow that reported a mid esophageal ring and she had an EGD and colonoscopy that did not mention any esophageal ring or a cause for her continued diarrhea.  The patient will be set up for a small bowel bacterial overgrowth breath test and she will also be set up for a upper endoscopy with possible dilation.  The patient has also been told to split up the Lomotil so that she is taking 2 in the morning and 2 in the afternoon/evening to keep her diarrhea better controlled instead of taking the medication as 4 pills every morning.  The patient has been explained the plan and agrees with it.     [name], MD. Marval Regal    Note: This dictation was prepared with Dragon dictation along with smaller phrase technology. Any transcriptional errors that result from this process are unintentional.

## 2019-03-15 NOTE — Progress Notes (Signed)
Primary Care Physician: [name], MD  Primary Gastroenterologist:  Dr. [name]  Chief Complaint  Patient presents with  . Diarrhea    HPI: [name] is a 49 y.o. female here with a history of seeing me back in March 2018 for acid reflux and then followed up with the [facility] clinic with an EGD and colonoscopy by Dr. Alice Reichert in December 2019 with no findings to explain her symptoms.  The patient was having diarrhea and dysphagia.  The patient also had a modified barium swallow that reported a mid esophageal ring that was not commented on during the upper endoscopy.  The patient now reports that she is having chronic vomiting and nausea but despite this has gained approximately 20 pounds since I had seen her in 2018.  The patient is status post gastric bypass surgery. The patient reports that she was not happy with the answer she was getting from her previous gastroenterologist that she was still having symptoms.  There is no report of any bloody stools or black stools.  The patient reports that she has been taking Lomotil 4 tablets every morning and states that she can sometimes have diarrhea by the afternoon.  Past Medical History:  Diagnosis Date  . Acute pyelonephritis   . Anemia    iron deficiency anemia  . Aortic arch aneurysm ([place])   . Bipolar disorder ([place])   . BRCA negative 2014  . CAD (coronary artery disease)    a. 08/2003 Cath: LAD 30-40-med Rx; b. 11/2014 PCI: LAD 10m(3.25x23 Xience Alpine DES); c. 06/2015 PCI: D1 (2.25x12 Resolute Integrity DES); d. 06/2017 PCI: Patent mLAD stent, D2 95 (PTCA); e. 09/2017 PCI: D2 99ost (CBA); d. 12/2017 Cath: LM nl, LAD 320m80d (small), D1 40ost, D2 95ost, LCX 40p, RCA 40ost/p->Med rx for D2 given restenosis.  . CKD (chronic kidney disease), stage III   . Colon polyp   . CVA (cerebral vascular accident) ([facility]   Left side weakness.   . Diabetes ([facility]  . Family history of breast cancer    BRCA neg 2014  . Gastric  ulcer 04/27/2011  . History of echocardiogram    a. 03/2017 Echo: EF 60-65%, no rwma; b. 02/2018 Echo: EF 60-65%, no rwma. Nl RV fxn. No cardiac source of emboli (admitted w/ stroke).  . [place] (hypertension)   . Hyperlipemia   . Hypothyroid   . Malignant melanoma of skin of scalp ([facility]  . MI, acute, non ST segment elevation ([facility] [place]  . Neuromuscular disorder ([facility]  . Orthostatic hypotension   . S/P drug eluting coronary stent placement 06/04/2015  . Sepsis ([facility]/14/2019  . [facility]   a. 02/2018 MRI: 12m41mate acute/early subacute L medial frontal lobe inarct; b. 02/2018 MRA No large vessel occlusion or aneurysm. Mod to sev L P2 stenosis. thready L vertebral artery, diffusely dzs'd; c. 02/2018 Carotid U/S: <50% bilat ICA dzs.    Current Outpatient Medications  Medication Sig Dispense Refill  . ALPRAZolam (XANAX) 1 MG tablet TAKE 1 TABLET BY MOUTH ONCE OR TWICE A DAY FOR ANXIETY 45 tablet 3  . amoxicillin (AMOXIL) 500 MG capsule Take 1 capsule (500 mg total) by mouth 2 (two) times daily. 20 capsule 0  . aspirin 81 MG EC tablet Take by mouth.    . clopidogrel (PLAVIX) 75 MG tablet Take 1 tablet (75 mg total) by mouth daily. 90 tablet 3  . diphenoxylate-atropine (LOMOTIL) 2.5-0.025 MG  tablet Take 1-4 tablets by mouth daily. Take one capsule 1-4 times daily for diarrhea 30 tablet 5  . furosemide (LASIX) 20 MG tablet     . gabapentin (NEURONTIN) 300 MG capsule Take 1 capsule (300 mg total) by mouth 2 (two) times daily. 180 capsule 4  . Galcanezumab-gnlm (EMGALITY) 120 MG/ML SOAJ INJECT 240 MG SUBCUTANEOUSLY AS DIRECTED FOR THE FIRST MONTH.    . isosorbide mononitrate (IMDUR) 60 MG 24 hr tablet Take 0.5 tablets (30 mg total) by mouth daily. 15 tablet 3  . levothyroxine (SYNTHROID, LEVOTHROID) 25 MCG tablet Take 1 tablet (25 mcg total) by mouth daily before breakfast. 30 tablet 12  . metFORMIN (GLUCOPHAGE-XR) 500 MG 24 hr tablet Take 500 mg by mouth daily.    . metoprolol succinate (TOPROL-XL) 25  MG 24 hr tablet Take 25 mg by mouth daily.    . nitroGLYCERIN (NITROSTAT) 0.4 MG SL tablet Place 1 tablet (0.4 mg total) under the tongue every 5 (five) minutes as needed for chest pain. 25 tablet 2  . pantoprazole (PROTONIX) 40 MG tablet Take 1 tablet (40 mg total) by mouth 2 (two) times daily. 60 tablet 5  . promethazine (PHENERGAN) 25 MG tablet Take 1 tablet (25 mg total) by mouth every 6 (six) hours as needed for nausea or vomiting. 30 tablet 5  . rosuvastatin (CRESTOR) 40 MG tablet Take 1 tablet (40 mg total) by mouth daily. 90 tablet 4  . traZODone (DESYREL) 100 MG tablet Take 100 mg by mouth at bedtime.    Marland Kitchen venlafaxine XR (EFFEXOR-XR) 75 MG 24 hr capsule Take 1 capsule (75 mg total) by mouth daily with breakfast. 30 capsule 5  . amLODipine (NORVASC) 10 MG tablet Take 1 tablet (10 mg total) by mouth daily. (Patient taking differently: Take 5 mg by mouth daily. ) 30 tablet 0  . buPROPion (WELLBUTRIN XL) 300 MG 24 hr tablet Take 1 tablet (300 mg total) by mouth daily. 90 tablet 1  . losartan (COZAAR) 50 MG tablet Take 1 tablet (50 mg total) by mouth daily. 30 tablet 0   No current facility-administered medications for this visit.    Allergies as of 03/15/2019 - Review Complete 03/15/2019  Allergen Reaction Noted  . Lipitor [atorvastatin] Other (See Comments) 04/14/2017  . Tramadol Other (See Comments) 01/12/2013    ROS:  General: Negative for anorexia, weight loss, fever, chills, fatigue, weakness. ENT: Negative for hoarseness, difficulty swallowing , nasal congestion. CV: Negative for chest pain, angina, palpitations, dyspnea on exertion, peripheral edema.  Respiratory: Negative for dyspnea at rest, dyspnea on exertion, cough, sputum, wheezing.  GI: See history of present illness. GU:  Negative for dysuria, hematuria, urinary incontinence, urinary frequency, nocturnal urination.  Endo: Negative for unusual weight change.    Physical Examination:   BP 129/81   Pulse 62   Temp  (!) 97.5 F (36.4 C) (Oral)   Ht 5' 2" (1.575 m)   Wt 148 lb (67.1 kg)   BMI 27.07 kg/m   General: Well-nourished, well-developed in no acute distress.  Eyes: No icterus. Conjunctivae pink. Lungs: Clear to auscultation bilaterally. Non-labored. Heart: Regular rate and rhythm, no murmurs rubs or gallops.  Abdomen: Bowel sounds are normal, nontender, nondistended, no hepatosplenomegaly or masses, no abdominal bruits or hernia , no rebound or guarding.   Extremities: No lower extremity edema. No clubbing or deformities. Neuro: Alert and oriented x 3.  Grossly intact. Skin: Warm and dry, no jaundice.   Psych: Alert and cooperative, normal mood  and affect.  Labs:    Imaging Studies: CT HEAD WO CONTRAST  Result Date: 02/18/2019 CLINICAL DATA:  49 year old female with focal neurologic deficit. Concern for stroke. EXAM: CT HEAD WITHOUT CONTRAST TECHNIQUE: Contiguous axial images were obtained from the base of the skull through the vertex without intravenous contrast. COMPARISON:  Head CT dated 10/12/2018. FINDINGS: Brain: The ventricles and sulci appropriate size for patient's age. Moderate periventricular and deep white matter chronic microvascular ischemic changes similar in appearance to the prior CT. Small old right basal ganglia lacunar infarcts. There is no acute intracranial hemorrhage. Mass effect or midline shift. No extra-axial fluid collection. Vascular: No hyperdense vessel or unexpected calcification. Skull: Normal. Negative for fracture or focal lesion. Sinuses/Orbits: No acute finding. Other: None IMPRESSION: 1. No acute intracranial pathology. 2. Chronic microvascular ischemic changes similar to prior CT. Electronically Signed   By: Anner Crete M.D.   On: 02/18/2019 17:55   MR BRAIN WO CONTRAST  Result Date: 02/19/2019 CLINICAL DATA:  Left lower extremity weakness. EXAM: MRI HEAD WITHOUT CONTRAST TECHNIQUE: Multiplanar, multiecho pulse sequences of the brain and surrounding  structures were obtained without intravenous contrast. COMPARISON:  CT head without contrast 02/18/2019 and 10/12/2018. MR head without contrast 06/15/2018 FINDINGS: Brain: No acute infarct, hemorrhage, or mass lesion is present. Multiple remote nonhemorrhagic lacunar infarcts are again noted within the basal ganglia. Advanced confluent periventricular white matter disease is present. Remote lacunar infarcts are present in the corona radiata bilaterally. Remote lacunar infarcts are present in the thalami and central pons. No significant extraaxial fluid collection is present. Vascular: Flow is present in the major intracranial arteries. Skull and upper cervical spine: The craniocervical junction is normal. Upper cervical spine is within normal limits. Marrow signal is unremarkable. Sinuses/Orbits: The paranasal sinuses and mastoid air cells are clear. The globes and orbits are within normal limits. IMPRESSION: 1. No acute intracranial abnormality or significant interval change. 2. Multiple remote lacunar infarcts of the basal ganglia and corona radiata bilaterally. 3. Advanced confluent periventricular white matter disease. This likely reflects the sequela of chronic microvascular ischemia. Electronically Signed   By: San Morelle M.D.   On: 02/19/2019 08:14   VAS US CAROTID  Result Date: 02/21/2019 Carotid Arterial Duplex Study Indications:       Carotid artery disease and patient denies any cerebrovascular                    symptoms today. Risk Factors:      Hypertension, hyperlipidemia, Diabetes, past history of                    smoking, prior MI, coronary artery disease, prior CVA. Other Factors:     Stage 3 CKD. Comparison Study:  Prior carotid duplex exam on 06/15/2018 showed velocities in                    right ICA 72/31 cm/s and left ICA 97/32 cm/s. Performing Technologist: Salvadore Dom RVT, RDCS (AE), RDMS  Examination Guidelines: A complete evaluation includes B-mode imaging, spectral  Doppler, color Doppler, and power Doppler as needed of all accessible portions of each vessel. Bilateral testing is considered an integral part of a complete examination. Limited examinations for reoccurring indications may be performed as noted.  Right Carotid Findings: +----------+--------+--------+--------+-------------------+--------+           PSV cm/sEDV cm/sStenosisPlaque Description Comments +----------+--------+--------+--------+-------------------+--------+ CCA Prox  90      19                                          +----------+--------+--------+--------+-------------------+--------+  CCA Distal63      18                                          +----------+--------+--------+--------+-------------------+--------+ ICA Prox  41      14              calcific and smooth         +----------+--------+--------+--------+-------------------+--------+ ICA Mid   62      22      1-39%                               +----------+--------+--------+--------+-------------------+--------+ ICA Distal71      22                                          +----------+--------+--------+--------+-------------------+--------+ ECA       69      14                                          +----------+--------+--------+--------+-------------------+--------+ +----------+--------+-------+----------------+-------------------+           PSV cm/sEDV cmsDescribe        Arm Pressure (mmHG) +----------+--------+-------+----------------+-------------------+ Subclavian103     11     Multiphasic, WNL120                 +----------+--------+-------+----------------+-------------------+ +---------+--------+--+--------+--+---------+ VertebralPSV cm/s46EDV cm/s14Antegrade +---------+--------+--+--------+--+---------+  Left Carotid Findings: +----------+--------+--------+--------+------------------+---------------+           PSV cm/sEDV cm/sStenosisPlaque DescriptionComments         +----------+--------+--------+--------+------------------+---------------+ CCA Prox  172     10                                                +----------+--------+--------+--------+------------------+---------------+ CCA Distal126     10                                low bifurcation +----------+--------+--------+--------+------------------+---------------+ ICA Prox  57      15                                                +----------+--------+--------+--------+------------------+---------------+ ICA Mid   59      22      1-39%                                     +----------+--------+--------+--------+------------------+---------------+ ICA Distal67      21                                                +----------+--------+--------+--------+------------------+---------------+ ECA       108       10                                                +----------+--------+--------+--------+------------------+---------------+ +----------+--------+--------+----------------+-------------------+           PSV cm/sEDV cm/sDescribe        Arm Pressure (mmHG) +----------+--------+--------+----------------+-------------------+ Subclavian209             Multiphasic, LGX211                 +----------+--------+--------+----------------+-------------------+ +---------+--------+--+--------+---------+ VertebralPSV cm/s15EDV cm/sAntegrade +---------+--------+--+--------+---------+  Summary: Right Carotid: Velocities in the right ICA are consistent with a 1-39% stenosis. Left Carotid: Velocities in the left ICA are consistent with a 1-39% stenosis. Vertebrals:  Bilateral vertebral arteries demonstrate antegrade flow. Subclavians: Normal flow hemodynamics were seen in bilateral subclavian              arteries. *See table(s) above for measurements and observations.  Electronically signed by Ena Dawley MD on 02/21/2019 at 9:40:40 AM.    Final     Assessment and Plan:    [name] is a 49 y.o. y/o female who comes in with chronic diarrhea with dysphagia to pills and rice.  The patient had a modified barium swallow that reported a mid esophageal ring and she had an EGD and colonoscopy that did not mention any esophageal ring or a cause for her continued diarrhea.  The patient will be set up for a small bowel bacterial overgrowth breath test and she will also be set up for a upper endoscopy with possible dilation.  The patient has also been told to split up the Lomotil so that she is taking 2 in the morning and 2 in the afternoon/evening to keep her diarrhea better controlled instead of taking the medication as 4 pills every morning.  The patient has been explained the plan and agrees with it.     [name], MD. Marval Regal    Note: This dictation was prepared with Dragon dictation along with smaller phrase technology. Any transcriptional errors that result from this process are unintentional.

## 2019-03-16 ENCOUNTER — Other Ambulatory Visit: Payer: Self-pay

## 2019-03-16 DIAGNOSIS — R1310 Dysphagia, unspecified: Secondary | ICD-10-CM

## 2019-03-16 DIAGNOSIS — R1319 Other dysphagia: Secondary | ICD-10-CM

## 2019-03-16 DIAGNOSIS — R197 Diarrhea, unspecified: Secondary | ICD-10-CM

## 2019-03-17 DIAGNOSIS — M7062 Trochanteric bursitis, left hip: Secondary | ICD-10-CM | POA: Diagnosis not present

## 2019-03-17 DIAGNOSIS — S72142D Displaced intertrochanteric fracture of left femur, subsequent encounter for closed fracture with routine healing: Secondary | ICD-10-CM | POA: Diagnosis not present

## 2019-03-17 DIAGNOSIS — S7002XD Contusion of left hip, subsequent encounter: Secondary | ICD-10-CM | POA: Diagnosis not present

## 2019-03-21 ENCOUNTER — Encounter: Payer: Self-pay | Admitting: Family Medicine

## 2019-03-21 MED ORDER — FLUCONAZOLE 150 MG PO TABS: 150.0000 mg | ORAL_TABLET | Freq: Once | ORAL | 0 refills | Status: AC

## 2019-03-22 DIAGNOSIS — M2041 Other hammer toe(s) (acquired), right foot: Secondary | ICD-10-CM | POA: Diagnosis not present

## 2019-03-22 DIAGNOSIS — B351 Tinea unguium: Secondary | ICD-10-CM | POA: Diagnosis not present

## 2019-03-22 DIAGNOSIS — M205X2 Other deformities of toe(s) (acquired), left foot: Secondary | ICD-10-CM | POA: Diagnosis not present

## 2019-03-22 DIAGNOSIS — M216X2 Other acquired deformities of left foot: Secondary | ICD-10-CM | POA: Diagnosis not present

## 2019-03-22 DIAGNOSIS — M216X1 Other acquired deformities of right foot: Secondary | ICD-10-CM | POA: Diagnosis not present

## 2019-03-22 DIAGNOSIS — M79674 Pain in right toe(s): Secondary | ICD-10-CM | POA: Diagnosis not present

## 2019-03-22 DIAGNOSIS — E1142 Type 2 diabetes mellitus with diabetic polyneuropathy: Secondary | ICD-10-CM | POA: Diagnosis not present

## 2019-03-22 DIAGNOSIS — M79675 Pain in left toe(s): Secondary | ICD-10-CM | POA: Diagnosis not present

## 2019-03-22 DIAGNOSIS — M205X1 Other deformities of toe(s) (acquired), right foot: Secondary | ICD-10-CM | POA: Diagnosis not present

## 2019-03-22 DIAGNOSIS — M79671 Pain in right foot: Secondary | ICD-10-CM | POA: Diagnosis not present

## 2019-03-22 DIAGNOSIS — M2042 Other hammer toe(s) (acquired), left foot: Secondary | ICD-10-CM | POA: Diagnosis not present

## 2019-03-23 IMAGING — CT CT HEAD CODE STROKE
3 series · 15 of 46 positions shown, 18 images · non-contrast
Comparison: CT head 03/18/2017

CLINICAL DATA: Code stroke. Subacute neuro deficit. Slurred speech

EXAM:
CT HEAD WITHOUT CONTRAST
TECHNIQUE: Contiguous axial images were obtained from the base of the skull
through the vertex without intravenous contrast.

[Series 2: head wo · axial · 0.39mm/px · z∈[-73,+47]mm · 9 of 29 slices shown, 12 images]
[im 3/29  brain]
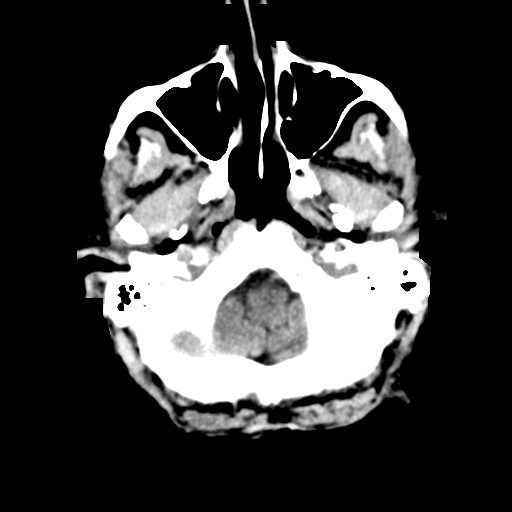
[im 3/29  bone]
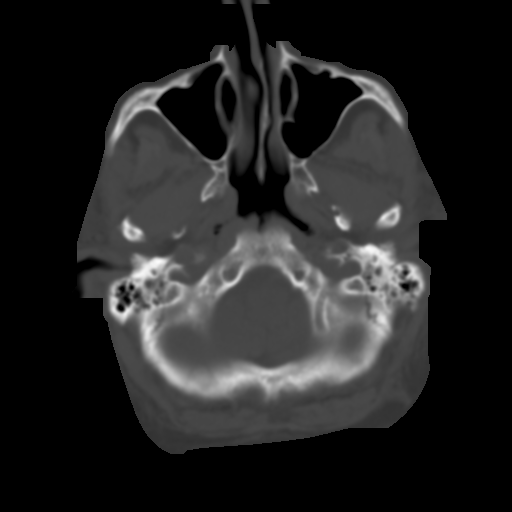
[im 6/29  brain]
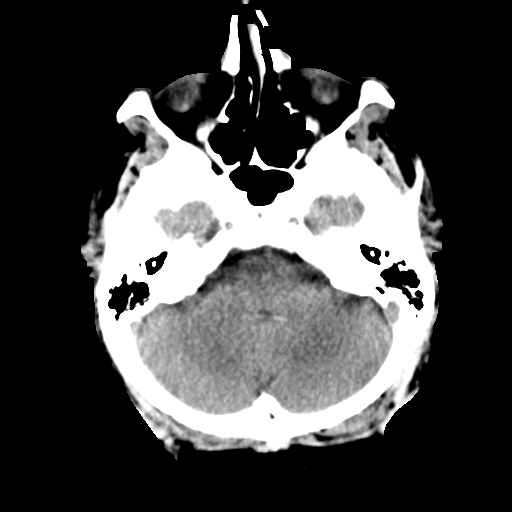
[im 9/29  brain]
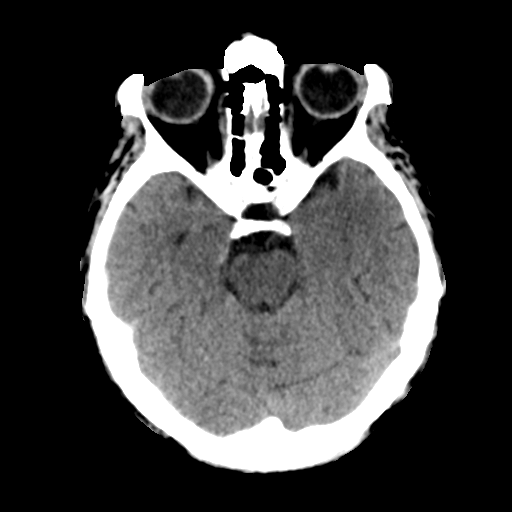
[im 12/29  brain]
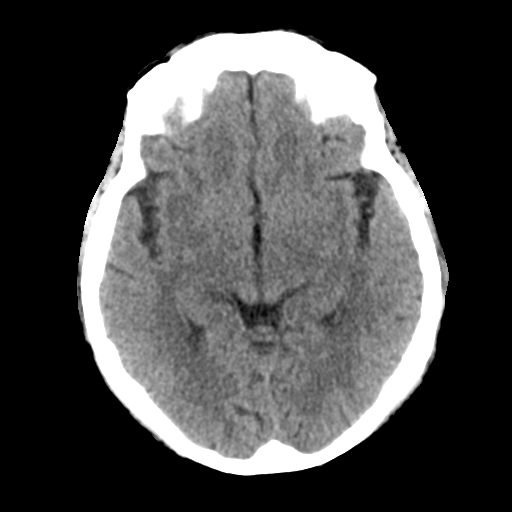
[im 15/29  brain]
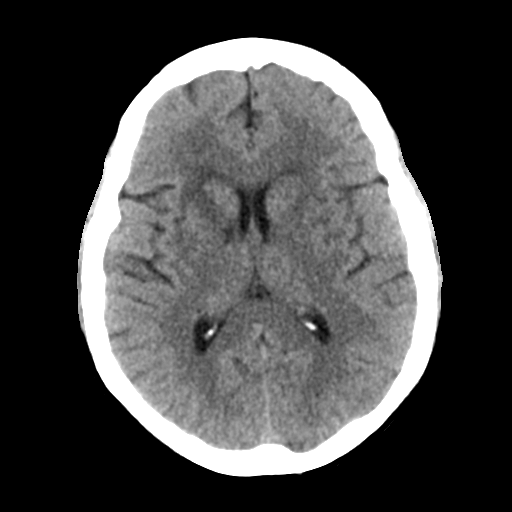
[im 15/29  bone]
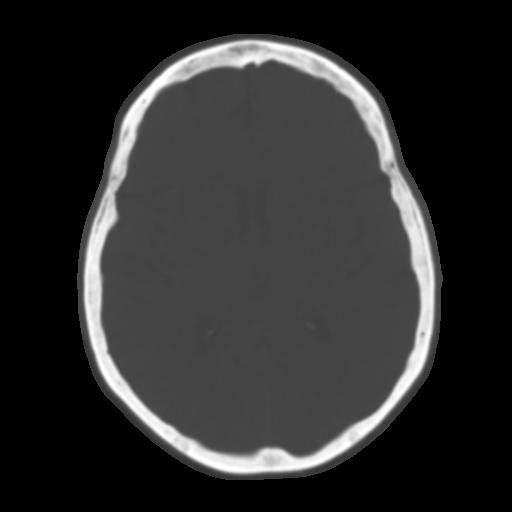
[im 18/29  brain]
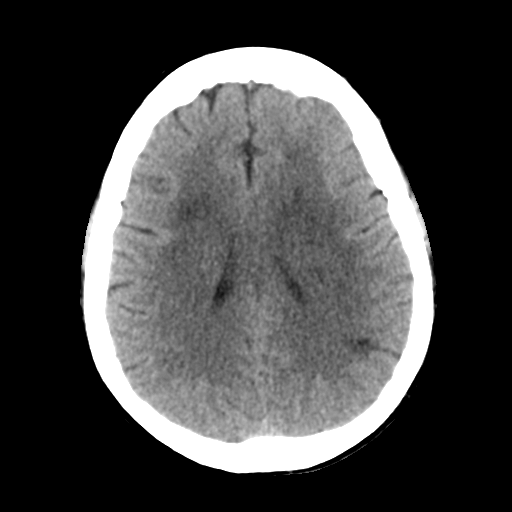
[im 21/29  brain]
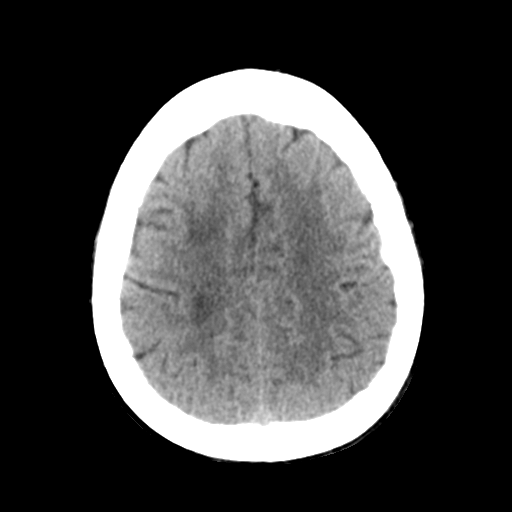
[im 24/29  brain]
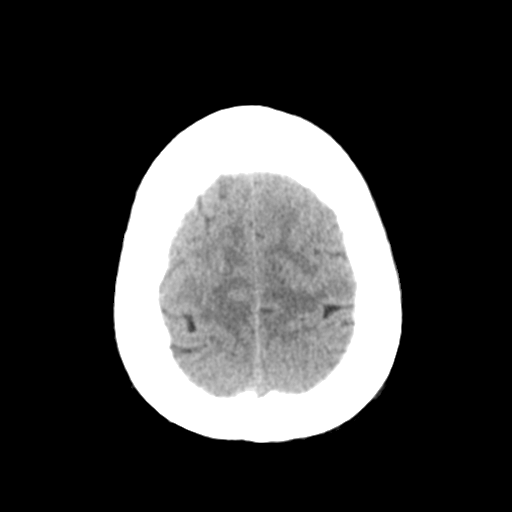
[im 27/29  brain]
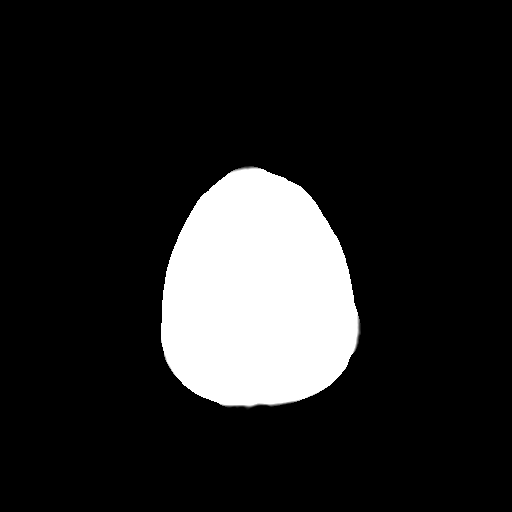
[im 27/29  bone]
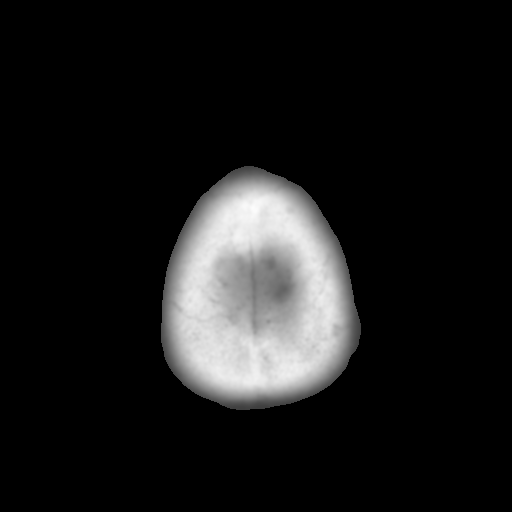

[Series 4: coronal soft tissue · coronal · 0.29mm/px · 3 of 64 slices shown]
[im 22/64  brain]
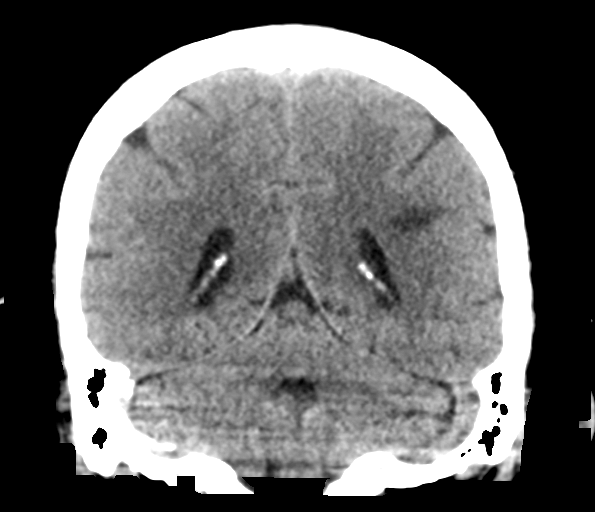
[im 29/64  brain]
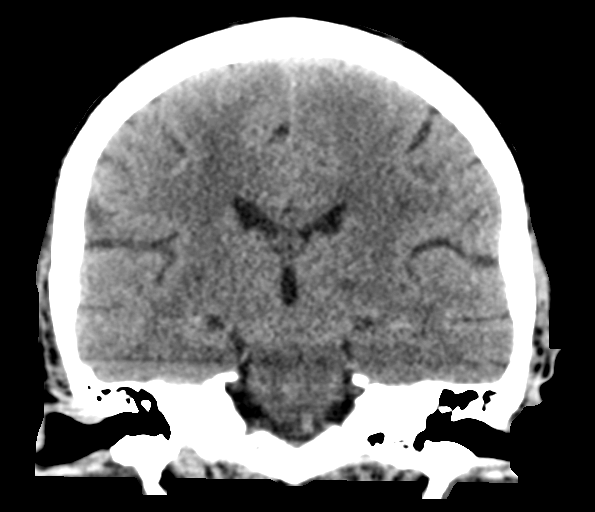
[im 36/64  brain]
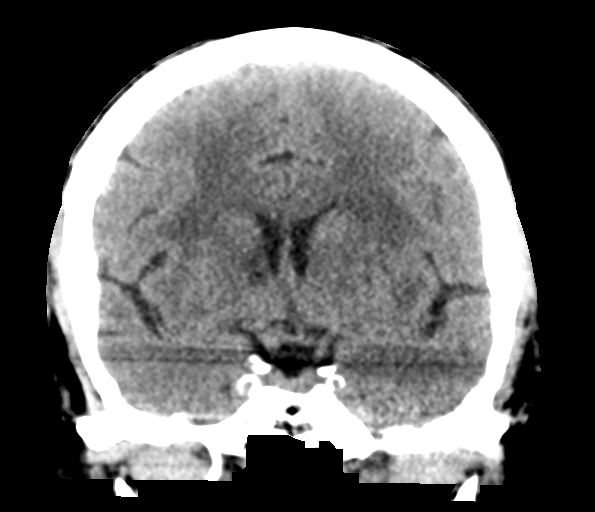

[Series 5: sagittal soft tissue · sagittal · 0.29mm/px · 3 of 58 slices shown]
[im 20/58  brain]
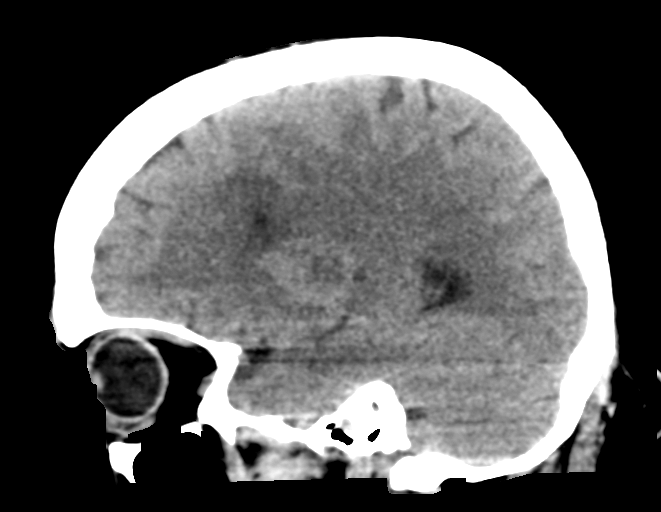
[im 29/58  brain]
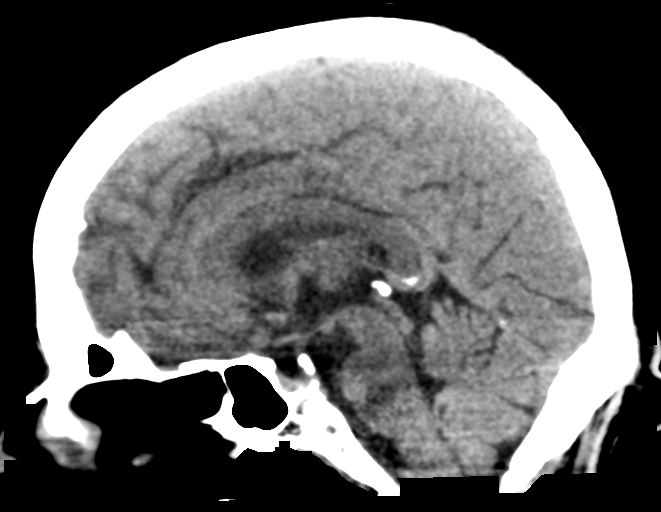
[im 39/58  brain]
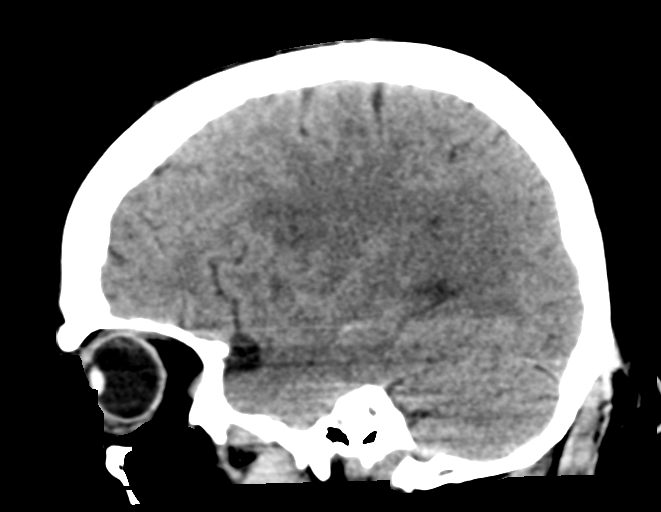

[15 of 46 positions shown; findings below may reference images not displayed]

FINDINGS: Brain: Negative for acute infarct.  Negative for hemorrhage or mass.

Extensive chronic white matter changes are stable from the prior
study. Chronic infarct right internal capsule stable. Chronic
infarct left thalamus stable.

Vascular: Atherosclerotic calcification. Negative for acute vascular
thrombosis

Skull: Negative

Sinuses/Orbits: Negative

Other: None

ASPECTS (Alberta Stroke Program Early CT Score)

- Ganglionic level infarction (caudate, lentiform nuclei, internal
capsule, insula, M1-M3 cortex): 7

- Supraganglionic infarction (M4-M6 cortex): 3

Total score (0-10 with 10 being normal): 10
IMPRESSION: 1. No acute abnormality
2. Extensive chronic microvascular ischemia, stable from the recent
CT
3. ASPECTS is 10
4. These results were called by telephone at the time of
interpretation on 04/09/2017 at [DATE] to Dr. KARINA ANASTASIJA DIMAVICIUS , who
verbally acknowledged these results.

## 2019-03-24 IMAGING — MR MR MRA HEAD W/O CM
9 of 11 series · 29 of 48 positions shown · non-contrast
Comparison: Prior CT from 04/09/2017

CLINICAL DATA: Initial evaluation for acute syncope, right upper
and lower with left lower extremity heaviness, tingling.

EXAM:
MRI HEAD WITHOUT CONTRAST
MRA HEAD WITHOUT CONTRAST
TECHNIQUE: Multiplanar, multiecho pulse sequences of the brain and surrounding
structures were obtained without intravenous contrast. Angiographic
images of the head were obtained using MRA technique without
contrast.

[Series 2: GRE · sagittal · 5.0mm · 0.45mm/px · 3 of 22 slices shown (1 of 2)]
[im 1/22]
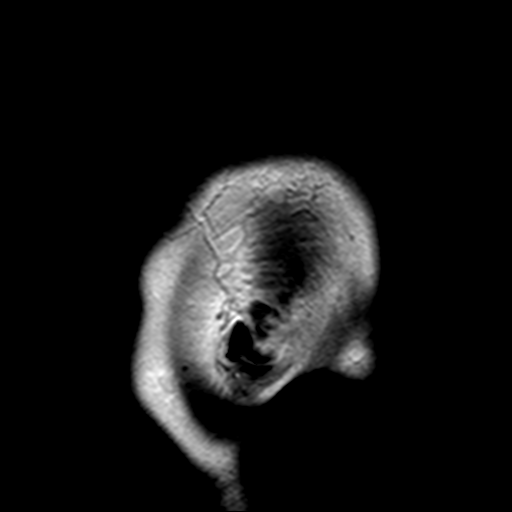
[im 11/22]
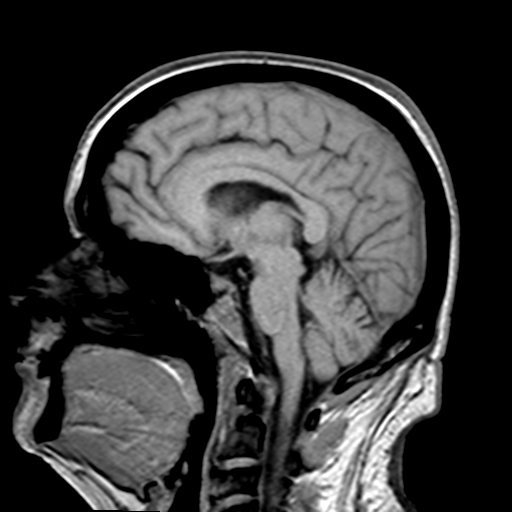
[im 22/22]
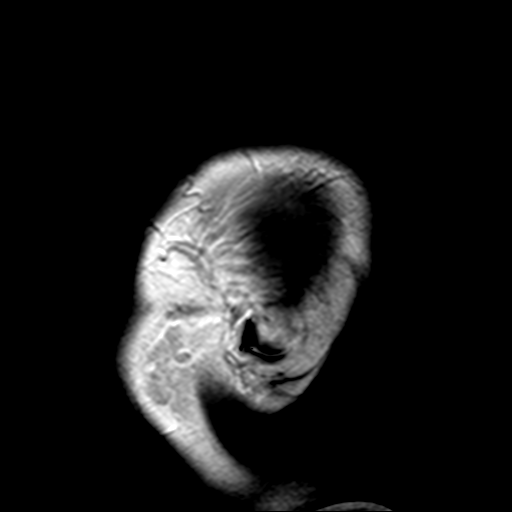

[Series 4: DWI · axial · 3.0mm · 1.80mm/px · z∈[-40,+104]mm · 6 of 50 slices shown (1 of 2)]
[im 1/50]
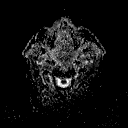
[im 10/50]
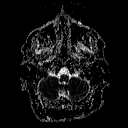
[im 20/50]
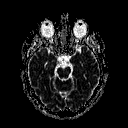
[im 30/50]
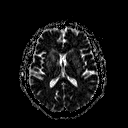
[im 40/50]
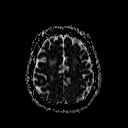
[im 50/50]
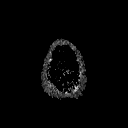

[Series 6: DWI · coronal · 3.0mm · 1.80mm/px · 4 of 41 slices shown (2 of 2)]
[im 1/41]
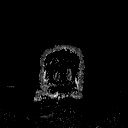
[im 14/41]
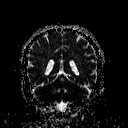
[im 27/41]
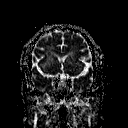
[im 41/41]
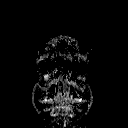

[Series 7: TOF · axial · non-contrast · 0.7mm · 0.37mm/px · 1 of 110 slices shown]
[im 1/110]
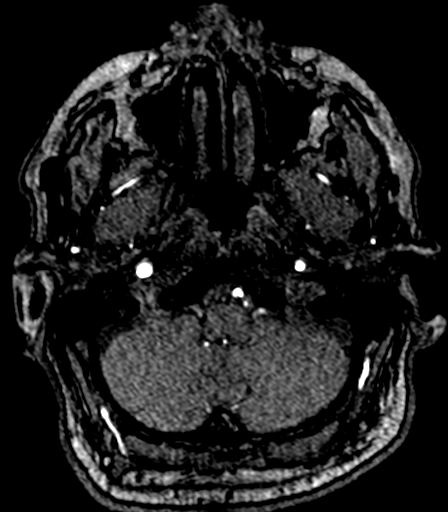

[Series 11: T2 · axial · 5.0mm · 0.45mm/px · z∈[-42,+104]mm · 2 of 24 slices shown (1 of 3)]
[im 1/24]
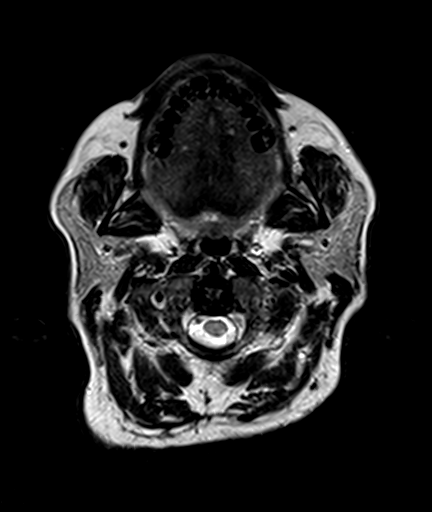
[im 24/24]
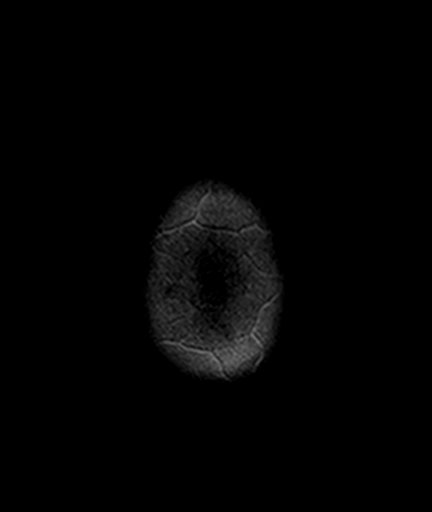

[Series 13: FLAIR · axial · 3.0mm · 0.45mm/px · z∈[-45,+107]mm · 5 of 53 slices shown]
[im 1/53]
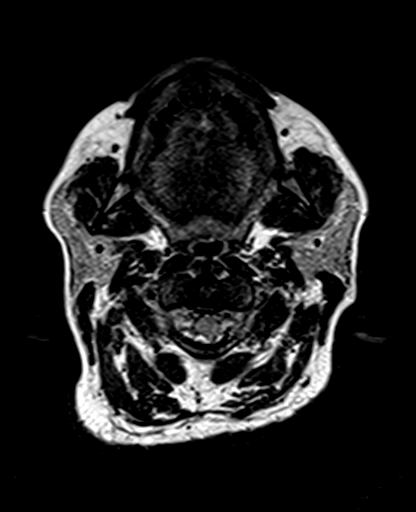
[im 14/53]
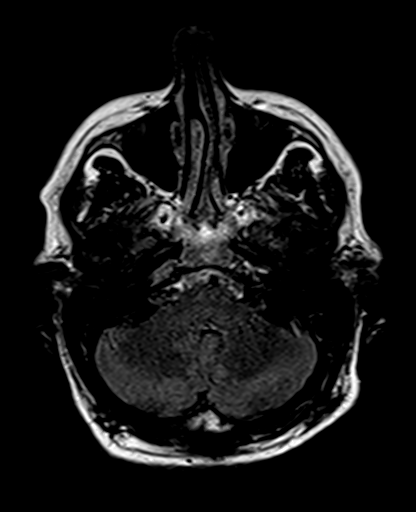
[im 27/53]
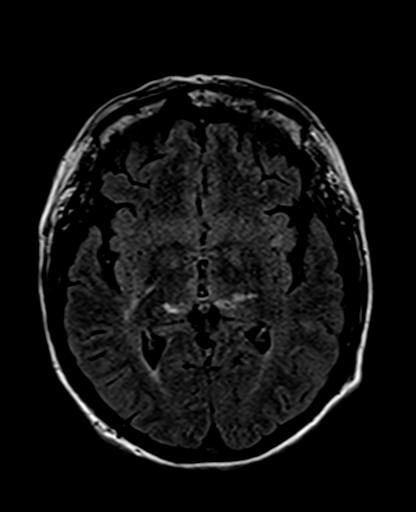
[im 40/53]
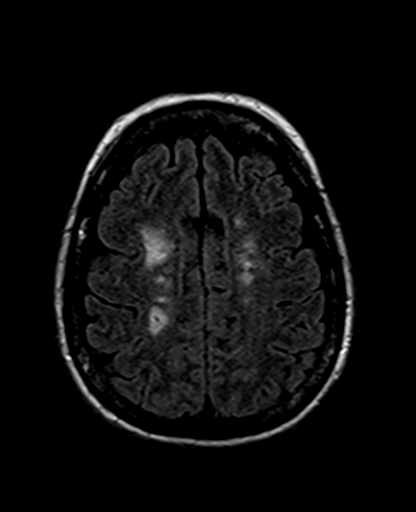
[im 53/53]
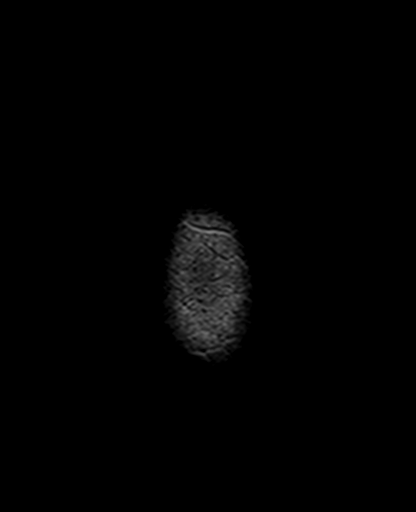

[Series 14: T2 · axial · 5.0mm · 1.20mm/px · z∈[-41,+105]mm · 2 of 24 slices shown (2 of 3)]
[im 1/24]
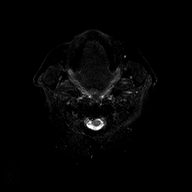
[im 24/24]
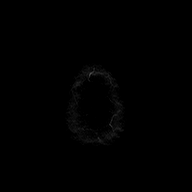

[Series 15: GRE · axial · 5.0mm · 0.45mm/px · z∈[-45,+108]mm · 3 of 25 slices shown (2 of 2)]
[im 1/25]
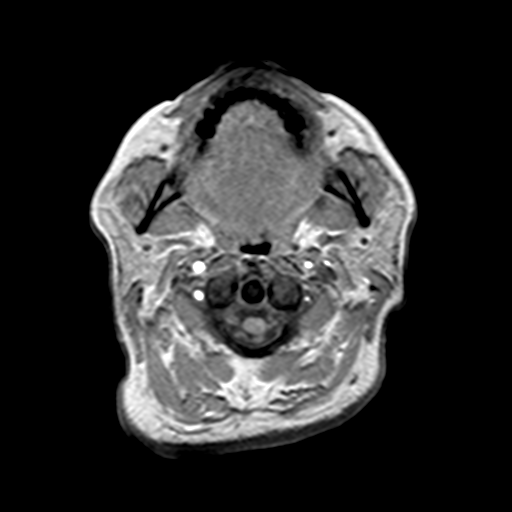
[im 13/25]
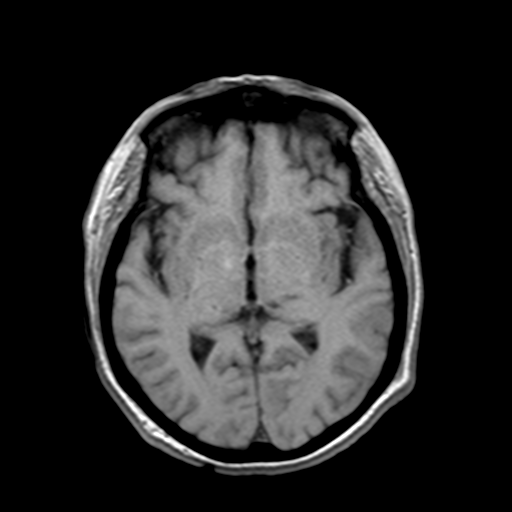
[im 25/25]
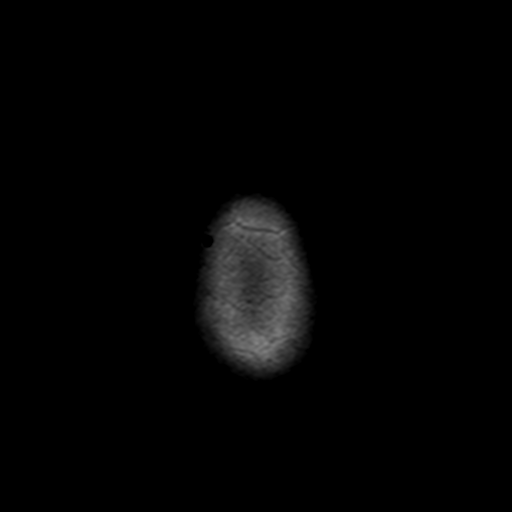

[Series 16: T2 · coronal · 5.0mm · 0.45mm/px · 3 of 25 slices shown (3 of 3)]
[im 1/25]
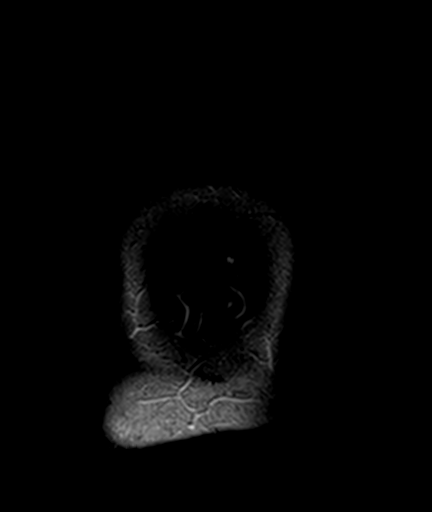
[im 13/25]
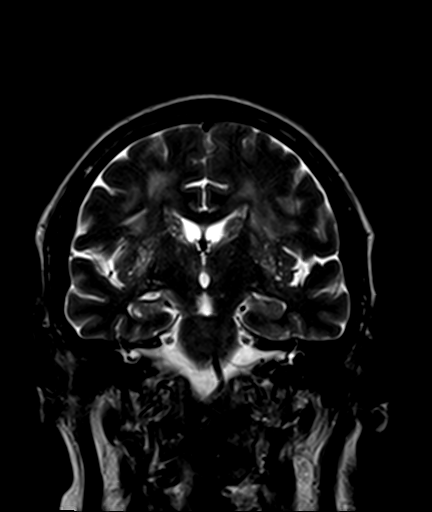
[im 25/25]
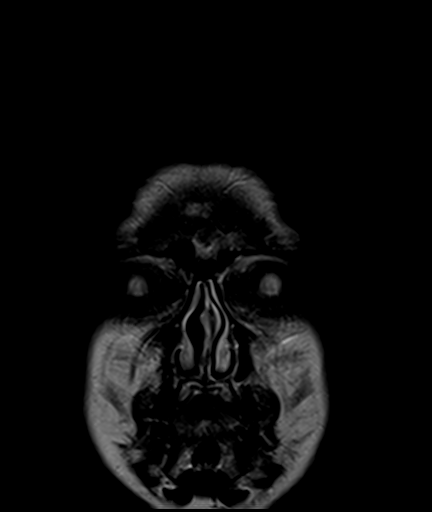

[29 of 48 positions shown; findings below may reference images not displayed]

FINDINGS: MRI HEAD FINDINGS

Brain: Generalized age-related cerebral atrophy. Patchy and
confluent T2/FLAIR hyperintensity within the periventricular and
deep white matter both cerebral hemispheres, most consistent with
chronic small vessel ischemic disease. Multiple scatter remote
lacunar infarcts present within the bilateral basal ganglia/corona
radiata, as well as the bilateral thalami. Multiple remote lacunar
infarcts present within the pons.

There is small focus of patchy diffusion abnormality within the
subcortical white matter of the anterior right centrum semi ovale
measuring 14 mm (series 100, image 35), consistent with an acute to
subacute ischemic infarct. Additional mild diffusion abnormality
with associated T2/FLAIR signal within the subcortical and deep
white matter of the posterior left parietal region also consistent
with ischemia, likely subacute in nature (series 100, image 33). No
associated hemorrhage or mass effect. No other evidence for acute or
subacute ischemia. Gray-white matter differentiation otherwise
maintained. No evidence for acute intracranial hemorrhage. Single
punctate chronic microhemorrhage noted within the posterior right
centrum semi ovale. No other evidence for chronic hemorrhage.

No mass lesion, midline shift or mass effect. No hydrocephalus. No
extra-axial fluid collection. Major dural sinuses are grossly
patent.

Pituitary gland suprasellar region normal. Midline structures
intact.

Vascular: Major intracranial vascular flow voids maintained.

Skull and upper cervical spine: Craniocervical junction normal. Bone
marrow signal intensity diffusely decreased on T1 weighted imaging,
most commonly related to anemia, smoking, or obesity. No scalp soft
tissue abnormality. Visualized upper cervical spine unremarkable.

Sinuses/Orbits: Globes and orbital soft tissues within normal
limits. Scattered mucosal thickening throughout the paranasal
sinuses. No mastoid effusion. Inner ear structures normal.

Other: None.

MRA HEAD FINDINGS

ANTERIOR CIRCULATION:

Distal cervical segments of the internal carotid arteries are patent
with antegrade flow. Petrous segments widely patent bilaterally.
Atheromatous irregularity within the cavernous/supraclinoid left ICA
with mild multifocal narrowing. Cavernous and supraclinoid right ICA
widely patent. Widely patent right A1 segment. Left A1 hypoplastic
and/or absent, accounting for the diminutive left ICA is compared to
the right. Normal anterior communicating artery. Anterior cerebral
arteries perfused to their distal aspects without flow-limiting
stenosis. Patent M1 segments without stenosis. Normal MCA
bifurcations. No proximal M2 occlusion. Distal MCA branches well
perfused and symmetric. Distal small vessel atheromatous
irregularity.

POSTERIOR CIRCULATION:

Vertebral arteries not well evaluated on this exam. Suggestive
dominant right vertebral artery with hypoplastic left vertebral
artery. Right PICA partially visualized and patent. Left PICA not
evaluated. Basilar artery patent to its distal aspect without
stenosis. Superior cerebral arteries patent bilaterally. Left PCA
supplied via the basilar. Hypoplastic right P1 with prominent right
posterior communicating artery. There are severe tandem bilateral P2
stenoses (series 102, image 7) PCAs otherwise patent to their distal
aspects. Distal small vessel atheromatous irregularity.

No aneurysm or vascular malformation.
IMPRESSION: MRI HEAD IMPRESSION:

1. Patchy small volume acute to subacute ischemic infarcts involving
the white matter of the right centrum semi ovale and posterior left
parietal region as above. No associated hemorrhage or mass effect.
2. Advanced chronic microvascular ischemic disease for age with
multiple remote lacunar infarcts involving the bilateral basal
ganglia/corona radiata, thalami, and pons.

MRA HEAD IMPRESSION:

1. Negative intracranial MRA for large vessel occlusion. No
high-grade proximal or correctable stenosis identified.
2. Tandem severe left P2 stenoses.
3. Mild-to-moderate distal small vessel atheromatous irregularity.

## 2019-03-24 IMAGING — CR DG SHOULDER 2+V*R*
3 series · 3 of 3 positions shown · non-contrast
Comparison: Right shoulder MRI 03/03/2017

CLINICAL DATA: Right shoulder pain.  Fell yesterday.

EXAM:
RIGHT SHOULDER - 2+ VIEW

[shoulder grashey]
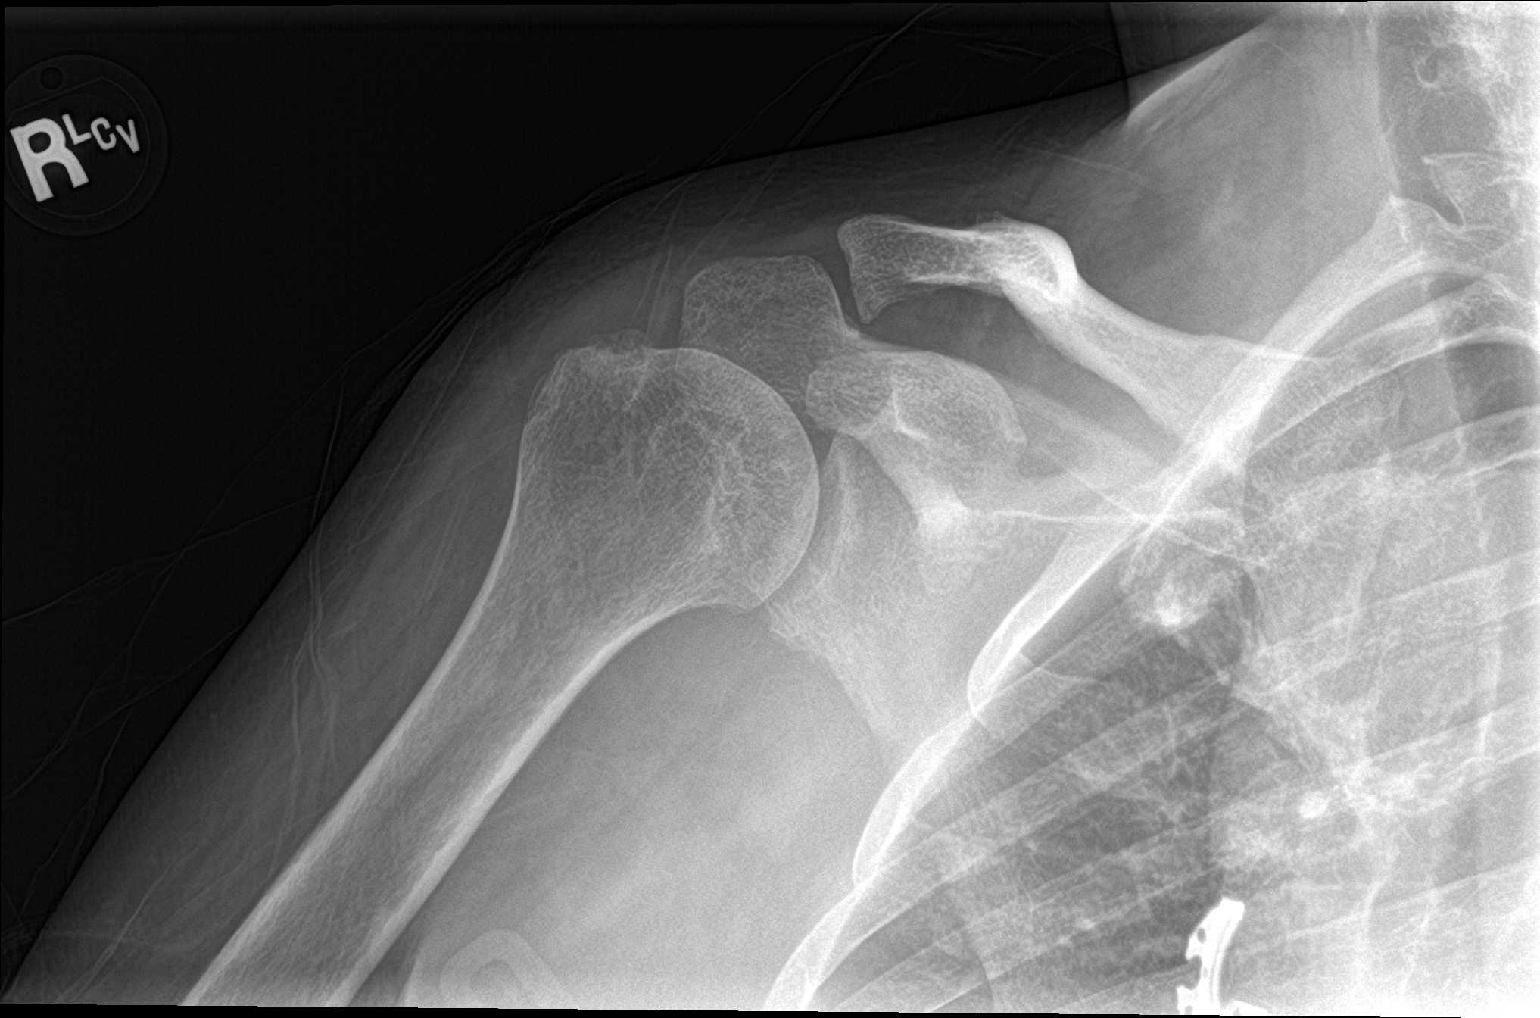

[shoulder y view]
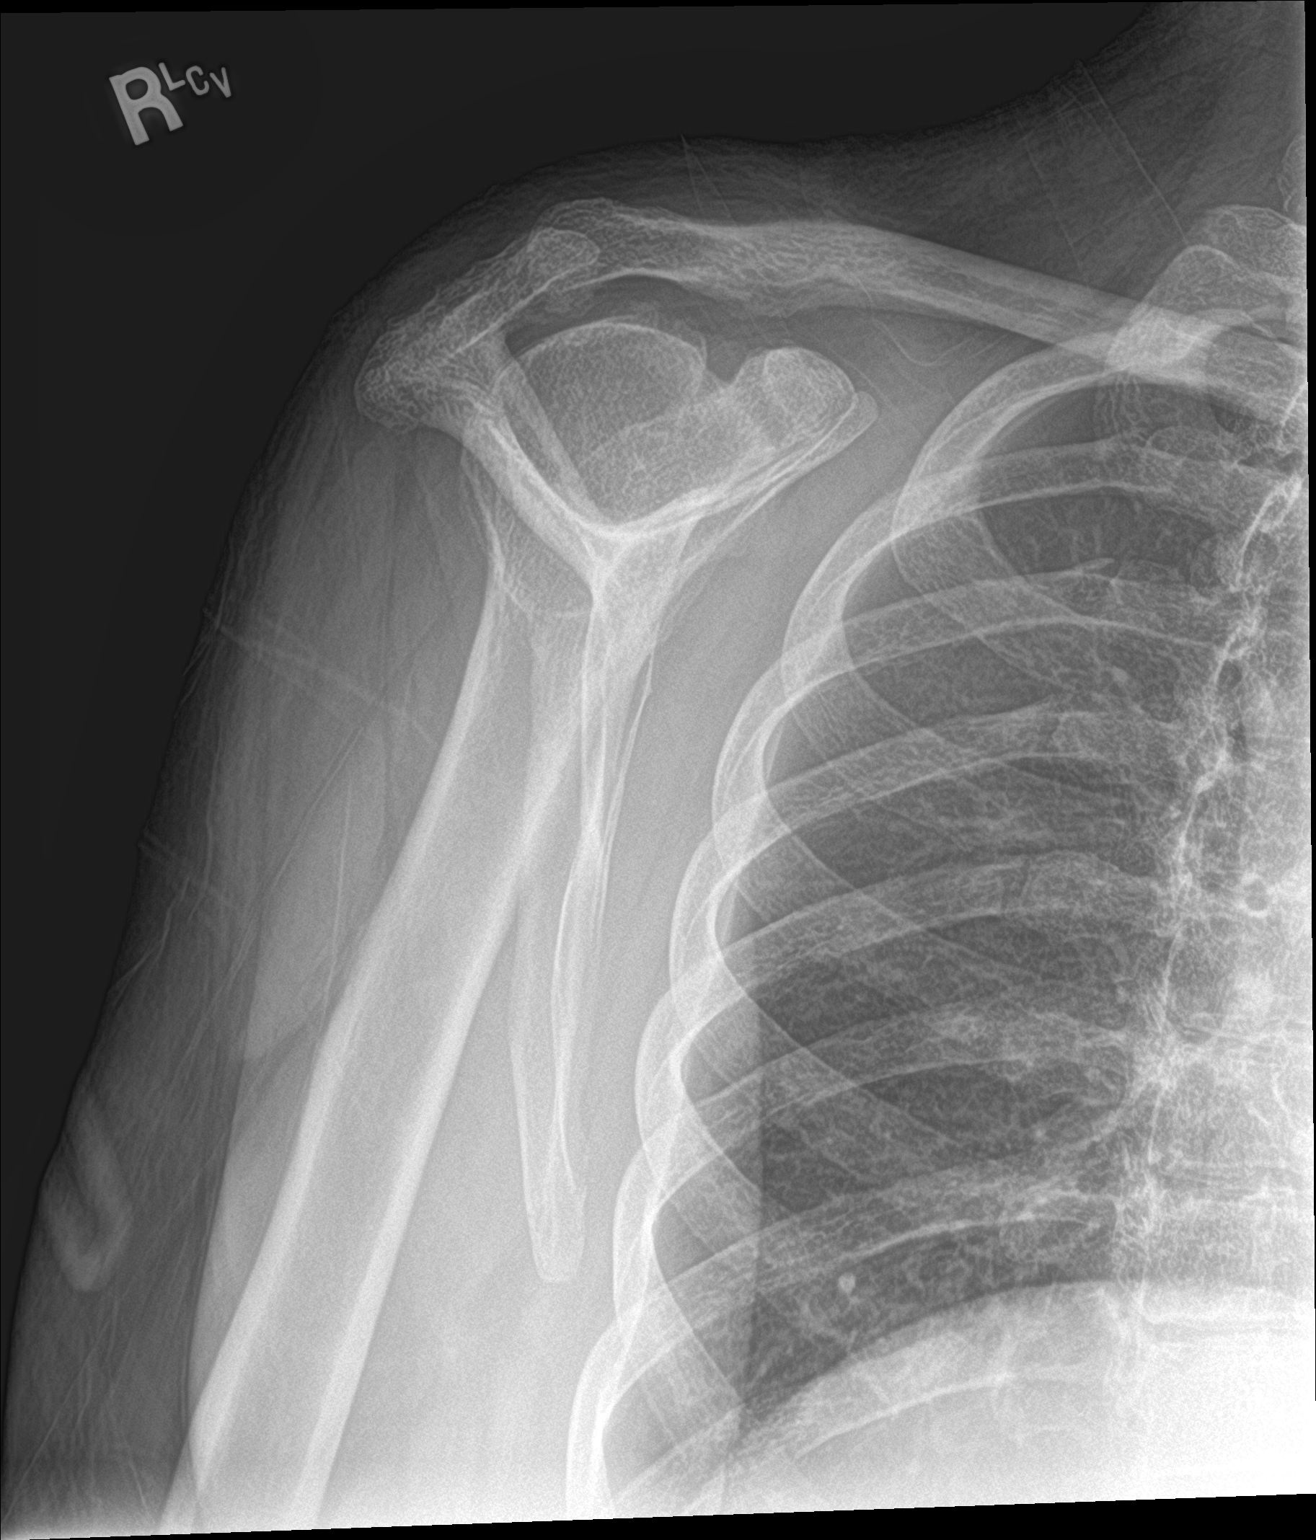

[shoulder ap neutral]
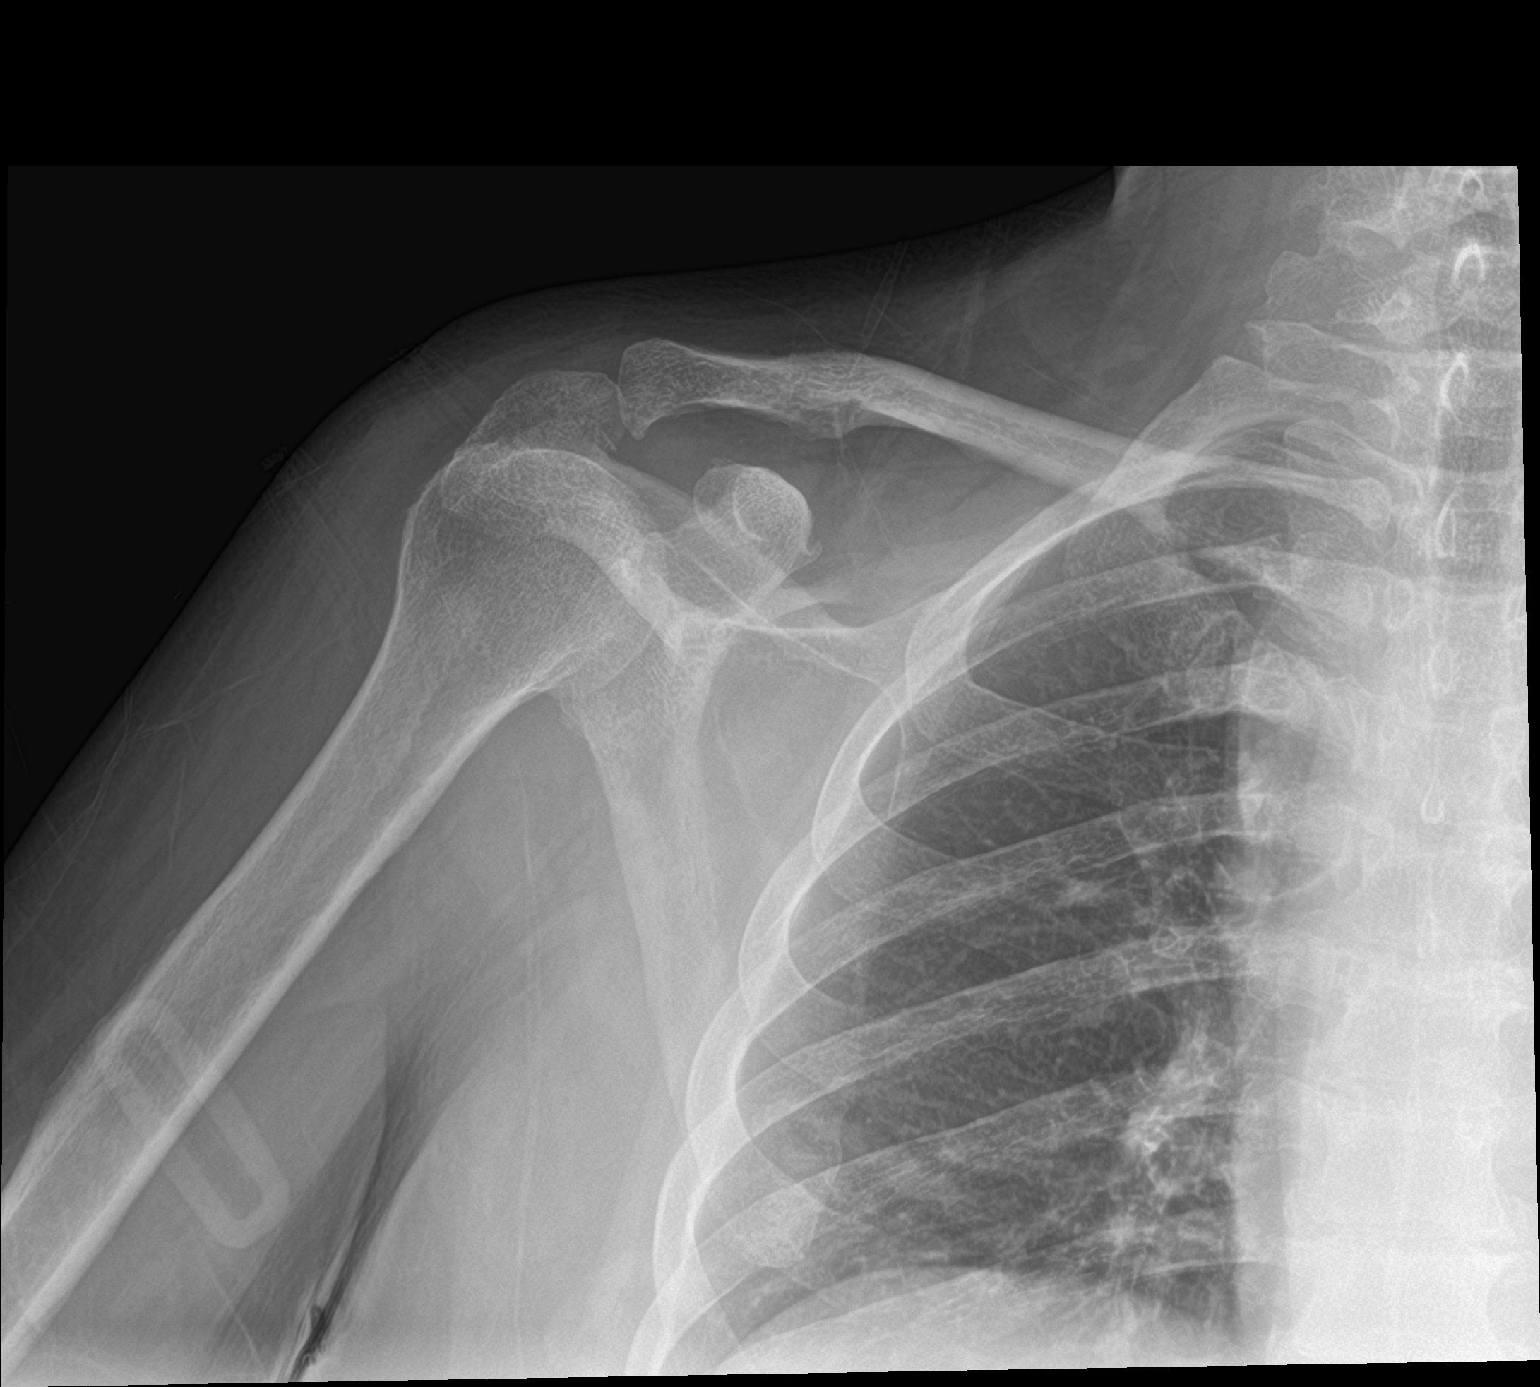

[3 of 3 positions shown; findings below may reference images not displayed]

FINDINGS: There is no evidence of acute fracture or dislocation. Mild AC joint
spurring is noted. The visualized portion of the right lung is
clear.
IMPRESSION: No acute osseous abnormality.

## 2019-03-29 ENCOUNTER — Encounter: Payer: Self-pay | Admitting: Family Medicine

## 2019-03-30 ENCOUNTER — Encounter: Payer: Self-pay | Admitting: Family Medicine

## 2019-04-01 NOTE — Progress Notes (Signed)
[place]  Telephone:([phone]) [phone] Fax:(336) [phone]  ID: [name] OB: 02-15-1970  MR#: [id number]  EEF#:[id number]  Patient Care Team: [name], MD as PCP - General (Family Medicine) [name] Kathlene November, MD as PCP - Cardiology (Cardiology) [name] Kathlene November, MD as Consulting Physician (Cardiology) Vladimir Crofts, MD as Consulting Physician (Neurology) Irene Shipper, MD as Consulting Physician (Gastroenterology) Caryn Section Kirstie Peri, MD as Referring Physician (Family Medicine) Christene Lye, MD (General Surgery) Concepcion Living, MD as Referring Physician (Neurology) Anthonette Legato, MD as Consulting Physician (Nephrology) Neldon Labella, RN as Case Manager Grayland Ormond, Kathlene November, MD as Consulting Physician (Oncology)  CHIEF COMPLAINT: Iron deficiency anemia.  INTERVAL HISTORY: Patient returns to clinic today for repeat laboratory work and further evaluation.  She feels improved since receiving IV iron several months ago.  She has no neurologic complaints.  She denies any recent fevers or illnesses.  She has a good appetite and denies weight loss.  She has no chest pain, shortness of breath, cough, or hemoptysis.  She denies any nausea, vomiting, or constipation.  She continues to have occasional diarrhea. She denies any melena or hematochezia.  She has no urinary complaints.  Patient offers no further specific complaints today.  REVIEW OF SYSTEMS:   Review of Systems  Constitutional: Negative.  Negative for fever, malaise/fatigue and weight loss.  Respiratory: Negative.  Negative for cough and shortness of breath.   Cardiovascular: Negative.  Negative for chest pain, palpitations and leg swelling.  Gastrointestinal: Positive for diarrhea. Negative for blood in stool and melena.  Genitourinary: Negative.  Negative for hematuria.  Musculoskeletal: Negative.  Negative for back pain.  Skin: Negative.  Negative for rash.  Neurological:  Negative.  Negative for dizziness, sensory change, focal weakness, weakness and headaches.  Psychiatric/Behavioral: Negative.  The patient is not nervous/anxious.     As per HPI. Otherwise, a complete review of systems is negative.  PAST MEDICAL HISTORY: Past Medical History:  Diagnosis Date  . Acute pyelonephritis   . Anemia    iron deficiency anemia  . Aortic arch aneurysm ([place])   . Bipolar disorder ([place])   . BRCA negative 2014  . CAD (coronary artery disease)    a. 08/2003 Cath: LAD 30-40-med Rx; b. 11/2014 PCI: LAD 21m(3.25x23 Xience Alpine DES); c. 06/2015 PCI: D1 (2.25x12 Resolute Integrity DES); d. 06/2017 PCI: Patent mLAD stent, D2 95 (PTCA); e. 09/2017 PCI: D2 99ost (CBA); d. 12/2017 Cath: LM nl, LAD 368m80d (small), D1 40ost, D2 95ost, LCX 40p, RCA 40ost/p->Med rx for D2 given restenosis.  . CKD (chronic kidney disease), stage III   . Colon polyp   . CVA (cerebral vascular accident) ([facility]   Left side weakness.   . Diabetes ([facility]  . Family history of breast cancer    BRCA neg 2014  . Gastric ulcer 04/27/2011  . History of echocardiogram    a. 03/2017 Echo: EF 60-65%, no rwma; b. 02/2018 Echo: EF 60-65%, no rwma. Nl RV fxn. No cardiac source of emboli (admitted w/ stroke).  . [place] (hypertension)   . Hyperlipemia   . Hypothyroid   . Malignant melanoma of skin of scalp ([facility]  . MI, acute, non ST segment elevation ([facility]  . Neuromuscular disorder ([facility]  . Orthostatic hypotension   . S/P drug eluting coronary stent placement 06/04/2015  . Sepsis ([facility]2/14/2019  . Stroke ([facility]   a. 02/2018 MRI: 4m54mate acute/early  subacute L medial frontal lobe inarct; b. 02/2018 MRA No large vessel occlusion or aneurysm. Mod to sev L P2 stenosis. thready L vertebral artery, diffusely dzs'd; c. 02/2018 Carotid U/S: <50% bilat ICA dzs.    PAST SURGICAL HISTORY: Past Surgical History:  Procedure Laterality Date  . APPENDECTOMY    . CARDIAC CATHETERIZATION N/A 11/09/2014   Procedure:  Coronary Angiography;  Surgeon: Minna Merritts, MD;  Location: [place] [facility];  Service: Cardiovascular;  Laterality: N/A;  . CARDIAC CATHETERIZATION N/A 11/12/2014   Procedure: Coronary Stent Intervention;  Surgeon: Isaias Cowman, MD;  Location: [place] [facility];  Service: Cardiovascular;  Laterality: N/A;  . CARDIAC CATHETERIZATION N/A 04/18/2015   Procedure: Left Heart Cath and Coronary Angiography;  Surgeon: Minna Merritts, MD;  Location: [place] [facility];  Service: Cardiovascular;  Laterality: N/A;  . CARDIAC CATHETERIZATION Left 06/04/2015   Procedure: Left Heart Cath and Coronary Angiography;  Surgeon: Wellington Hampshire, MD;  Location: [place] [facility];  Service: Cardiovascular;  Laterality: Left;  . CARDIAC CATHETERIZATION N/A 06/04/2015   Procedure: Coronary Stent Intervention;  Surgeon: Wellington Hampshire, MD;  Location: [place] [facility];  Service: Cardiovascular;  Laterality: N/A;  . CESAREAN SECTION  2001  . CHOLECYSTECTOMY N/A 11/18/2016   Procedure: LAPAROSCOPIC CHOLECYSTECTOMY WITH INTRAOPERATIVE CHOLANGIOGRAM;  Surgeon: Christene Lye, MD;  Location: [facility];  Service: General;  Laterality: N/A;  . COLONOSCOPY WITH PROPOFOL N/A 04/27/2016   Procedure: COLONOSCOPY WITH PROPOFOL;  Surgeon: [name], MD;  Location: [place];  Service: Endoscopy;  Laterality: N/A;  . COLONOSCOPY WITH PROPOFOL N/A 01/12/2018   Procedure: COLONOSCOPY WITH PROPOFOL;  Surgeon: Toledo, Benay Pike, MD;  Location: [facility];  Service: Endoscopy;  Laterality: N/A;  . CORONARY ANGIOPLASTY    . CORONARY BALLOON ANGIOPLASTY N/A 06/29/2017   Procedure: CORONARY BALLOON ANGIOPLASTY;  Surgeon: Wellington Hampshire, MD;  Location: [place] [facility];  Service: Cardiovascular;  Laterality: N/A;  . CORONARY BALLOON ANGIOPLASTY N/A 09/20/2017   Procedure: CORONARY BALLOON ANGIOPLASTY;  Surgeon: Wellington Hampshire, MD;  Location: [place] [facility];  Service:  Cardiovascular;  Laterality: N/A;  . DILATION AND CURETTAGE OF UTERUS    . ESOPHAGOGASTRODUODENOSCOPY (EGD) WITH PROPOFOL N/A 09/14/2014   Procedure: ESOPHAGOGASTRODUODENOSCOPY (EGD) WITH PROPOFOL;  Surgeon: Josefine Class, MD;  Location: [facility];  Service: Endoscopy;  Laterality: N/A;  . ESOPHAGOGASTRODUODENOSCOPY (EGD) WITH PROPOFOL N/A 04/27/2016   Procedure: ESOPHAGOGASTRODUODENOSCOPY (EGD) WITH PROPOFOL;  Surgeon: [name], MD;  Location: [place];  Service: Endoscopy;  Laterality: N/A;  Diabetic - oral meds  . ESOPHAGOGASTRODUODENOSCOPY (EGD) WITH PROPOFOL N/A 01/12/2018   Procedure: ESOPHAGOGASTRODUODENOSCOPY (EGD) WITH PROPOFOL;  Surgeon: Toledo, Benay Pike, MD;  Location: [facility];  Service: Endoscopy;  Laterality: N/A;  . GASTRIC BYPASS  09/2009   [place] (IM) NAIL INTERTROCHANTERIC Left 10/13/2018   Procedure: INTRAMEDULLARY (IM) NAIL INTERTROCHANTRIC;  Surgeon: Leandrew Koyanagi, MD;  Location: [place];  Service: Orthopedics;  Laterality: Left;  . Left Carotid to sublcavian artery bypass w/ subclavian artery ligation     a. Performed @ [facility].  . LEFT HEART CATH AND CORONARY ANGIOGRAPHY Left 06/29/2017   Procedure: LEFT HEART CATH AND CORONARY ANGIOGRAPHY;  Surgeon: Wellington Hampshire, MD;  Location: [place] [facility];  Service: Cardiovascular;  Laterality: Left;  . LEFT HEART CATH AND CORONARY ANGIOGRAPHY N/A 09/20/2017   Procedure: LEFT HEART CATH AND CORONARY ANGIOGRAPHY;  Surgeon: Fletcher Anon,  Mertie Clause, MD;  Location: [place] [facility];  Service: Cardiovascular;  Laterality: N/A;  . LEFT HEART CATH AND CORONARY ANGIOGRAPHY N/A 12/20/2017   Procedure: LEFT HEART CATH AND CORONARY ANGIOGRAPHY;  Surgeon: Wellington Hampshire, MD;  Location: [place] [facility];  Service: Cardiovascular;  Laterality: N/A;  . MELANOMA EXCISION  2016   Dr. Evorn Gong  . [place]  2002  . RIGHT OOPHORECTOMY    . SHOULDER ARTHROSCOPY WITH  OPEN ROTATOR CUFF REPAIR Right 01/07/2016   Procedure: SHOULDER ARTHROSCOPY WITH DEBRIDMENT, SUBACHROMIAL DECOMPRESSION;  Surgeon: Corky Mull, MD;  Location: [facility];  Service: Orthopedics;  Laterality: Right;  . SHOULDER ARTHROSCOPY WITH OPEN ROTATOR CUFF REPAIR Right 03/16/2017   Procedure: SHOULDER ARTHROSCOPY WITH OPEN ROTATOR CUFF REPAIR POSSIBLE BICEPS TENODESIS;  Surgeon: Corky Mull, MD;  Location: [facility];  Service: Orthopedics;  Laterality: Right;  . TRIGGER FINGER RELEASE Right     [place]    FAMILY HISTORY Family History  Problem Relation Age of Onset  . Hypertension Mother   . Anxiety disorder Mother   . Depression Mother   . Bipolar disorder Mother   . Heart disease Mother        No details  . Hyperlipidemia Mother   . Kidney disease Father   . Heart disease Father 105  . Hypertension Father   . Diabetes Father   . Stroke Father   . Colon cancer Father        dx in his 66's  . Anxiety disorder Father   . Depression Father   . Skin cancer Father   . Kidney disease Sister   . Thyroid nodules Sister   . Hypertension Sister   . Hypertension Sister   . Diabetes Sister   . Hyperlipidemia Sister   . Depression Sister   . Breast cancer Maternal Aunt 51  . Breast cancer Maternal Aunt 65  . Ovarian cancer Cousin   . Colon cancer Cousin   . Breast cancer Other   . Kidney cancer Neg Hx   . Bladder Cancer Neg Hx        ADVANCED DIRECTIVES:    HEALTH MAINTENANCE: Social History   Tobacco Use  . Smoking status: Former Smoker    Types: Cigarettes    Quit date: 08/31/1994    Years since quitting: 24.6  . Smokeless tobacco: Never Used  . Tobacco comment: quit 28 years ago  Substance Use Topics  . Alcohol use: No    Alcohol/week: 0.0 standard drinks  . Drug use: No     Allergies  Allergen Reactions  . Lipitor [Atorvastatin] Other (See Comments)    Leg pains  . Tramadol Other (See Comments)    Mouth feels like it's on fire    Current  Outpatient Medications  Medication Sig Dispense Refill  . amLODipine (NORVASC) 10 MG tablet Take 1 tablet (10 mg total) by mouth daily. (Patient taking differently: Take 5 mg by mouth daily. ) 30 tablet 0  . aspirin 81 MG EC tablet Take by mouth.    Marland Kitchen buPROPion (WELLBUTRIN XL) 300 MG 24 hr tablet Take 1 tablet (300 mg total) by mouth daily. 90 tablet 1  . clopidogrel (PLAVIX) 75 MG tablet Take 1 tablet (75 mg total) by mouth daily. 90 tablet 3  . diphenoxylate-atropine (LOMOTIL) 2.5-0.025 MG tablet Take 1-4 tablets by mouth daily. Take one capsule 1-4 times daily for diarrhea 30 tablet 5  . furosemide (LASIX) 20 MG tablet     .  gabapentin (NEURONTIN) 300 MG capsule Take 1 capsule (300 mg total) by mouth 2 (two) times daily. 180 capsule 4  . Galcanezumab-gnlm (EMGALITY) 120 MG/ML SOAJ INJECT 240 MG SUBCUTANEOUSLY AS DIRECTED FOR THE FIRST MONTH.    Marland Kitchen levothyroxine (SYNTHROID, LEVOTHROID) 25 MCG tablet Take 1 tablet (25 mcg total) by mouth daily before breakfast. 30 tablet 12  . losartan (COZAAR) 50 MG tablet Take 1 tablet (50 mg total) by mouth daily. 30 tablet 0  . metFORMIN (GLUCOPHAGE-XR) 500 MG 24 hr tablet Take 500 mg by mouth daily.    . metoprolol succinate (TOPROL-XL) 25 MG 24 hr tablet Take 25 mg by mouth daily.    . nitroGLYCERIN (NITROSTAT) 0.4 MG SL tablet Place 1 tablet (0.4 mg total) under the tongue every 5 (five) minutes as needed for chest pain. 25 tablet 2  . pantoprazole (PROTONIX) 40 MG tablet Take 1 tablet (40 mg total) by mouth 2 (two) times daily. 60 tablet 5  . promethazine (PHENERGAN) 25 MG tablet Take 1 tablet (25 mg total) by mouth every 6 (six) hours as needed for nausea or vomiting. 30 tablet 5  . rosuvastatin (CRESTOR) 40 MG tablet Take 1 tablet (40 mg total) by mouth daily. 90 tablet 4  . traZODone (DESYREL) 100 MG tablet Take 100 mg by mouth at bedtime.    Marland Kitchen venlafaxine XR (EFFEXOR-XR) 75 MG 24 hr capsule Take 1 capsule (75 mg total) by mouth daily with breakfast. 30  capsule 5  . ALPRAZolam (XANAX) 1 MG tablet TAKE 1 TABLET BY MOUTH 1 TO 2 TIMES DAILY FOR ANXIETY 45 tablet 3   No current facility-administered medications for this visit.    OBJECTIVE: Vitals:   04/03/19 1420 04/04/19 1333  BP: 128/85 128/85  Pulse: 77 77  Resp: 18 18  Temp: 97.7 F (36.5 C) 97.7 F (36.5 C)  SpO2:  100%     Body mass index is 27.01 kg/m.    ECOG FS:0 - Asymptomatic  General: Well-developed, well-nourished, no acute distress. Eyes: Pink conjunctiva, anicteric sclera. HEENT: Normocephalic, moist mucous membranes. Lungs: No audible wheezing or coughing. Heart: Regular rate and rhythm. Abdomen: Soft, nontender, no obvious distention. Musculoskeletal: No edema, cyanosis, or clubbing. Neuro: Alert, answering all questions appropriately. Cranial nerves grossly intact. Skin: No rashes or petechiae noted. Psych: Normal affect.   LAB RESULTS:  Lab Results  Component Value Date   NA 138 02/18/2019   K 3.6 02/18/2019   CL 103 02/18/2019   CO2 23 02/18/2019   GLUCOSE 94 02/18/2019   BUN 37 (H) 02/18/2019   CREATININE 2.28 (H) 02/18/2019   CALCIUM 8.5 (L) 02/18/2019   PROT 6.5 02/18/2019   ALBUMIN 3.4 (L) 02/18/2019   AST 17 02/18/2019   ALT 16 02/18/2019   ALKPHOS 111 02/18/2019   BILITOT 0.5 02/18/2019   GFRNONAA 24 (L) 02/18/2019   GFRAA 28 (L) 02/18/2019    Lab Results  Component Value Date   WBC 9.4 04/03/2019   NEUTROABS 4.1 02/18/2019   HGB 12.0 04/03/2019   HCT 38.1 04/03/2019   MCV 81.1 04/03/2019   PLT 185 04/03/2019   Lab Results  Component Value Date   IRON 79 04/03/2019   TIBC 210 (L) 04/03/2019   IRONPCTSAT 38 (H) 04/03/2019    Lab Results  Component Value Date   FERRITIN 130 04/03/2019     STUDIES: LONG TERM MONITOR (3-14 DAYS)  Result Date: 03/26/2019 Event Monitor Normal sinus rhythm avg HR of 71 bpm. 1 run of  Ventricular Tachycardia occurred lasting 5 beats with a max rate of 179 bpm (avg 125 bpm). 3  Supraventricular Tachycardia runs occurred, the run with the fastest interval lasting 5 beats with a max rate of 148 bpm, the longest lasting 7 beats with an avg rate of 107 bpm. Isolated SVEs were rare (<1.0%), SVE Couplets were rare (<1.0%), and SVE Triplets were rare (<1.0%). Isolated VEs were rare (<1.0%, 2346), VE Couplets were rare (<1.0%, 1), and VE Triplets were rare (<1.0%, 1). Ventricular Bigeminy was present. Signed, Esmond Plants, MD, Ph.D [facility]    ASSESSMENT: Iron deficiency anemia.  PLAN:    1. Iron deficiency anemia: Resolved.  Patient's hemoglobin and iron stores are within normal limits.  Patient likely has poor absorption given her history of gastric bypass surgery.  Previously, her entire anemia work-up was either negative or within normal limits.  She last received 510 mg IV Feraheme on February 15, 2019.  She does not require additional treatment today.  Return to clinic in 4 months with repeat laboratory work, further evaluation, and consideration of additional treatment.   2.  Diarrhea/esophageal stricture: Continue evaluation and treatment per GI.  Patient states that she has an EGD for esophageal dilation scheduled in the near future.   3.  Chronic renal insufficiency: Patient's most recent creatinine from January 2021 had increased to 2.28.  Continue to monitor.  Patient expressed understanding and was in agreement with this plan. She also understands that She can call clinic at any time with any questions, concerns, or complaints.   Lloyd Huger, MD   04/04/2019 2:14 PM

## 2019-04-03 ENCOUNTER — Encounter: Payer: Self-pay | Admitting: Oncology

## 2019-04-03 ENCOUNTER — Other Ambulatory Visit: Payer: Self-pay

## 2019-04-03 ENCOUNTER — Inpatient Hospital Stay: Payer: Medicare Other | Attending: Oncology

## 2019-04-03 DIAGNOSIS — D509 Iron deficiency anemia, unspecified: Secondary | ICD-10-CM | POA: Diagnosis not present

## 2019-04-03 DIAGNOSIS — R197 Diarrhea, unspecified: Secondary | ICD-10-CM | POA: Diagnosis not present

## 2019-04-03 DIAGNOSIS — N189 Chronic kidney disease, unspecified: Secondary | ICD-10-CM | POA: Insufficient documentation

## 2019-04-03 LAB — CBC
HCT: 38.1 % (ref 36.0–46.0)
Hemoglobin: 12 g/dL (ref 12.0–15.0)
MCH: 25.5 pg — ABNORMAL LOW (ref 26.0–34.0)
MCHC: 31.5 g/dL (ref 30.0–36.0)
MCV: 81.1 fL (ref 80.0–100.0)
Platelets: 185 10*3/uL (ref 150–400)
RBC: 4.7 MIL/uL (ref 3.87–5.11)
RDW: 19.3 % — ABNORMAL HIGH (ref 11.5–15.5)
WBC: 9.4 10*3/uL (ref 4.0–10.5)
nRBC: 0 % (ref 0.0–0.2)

## 2019-04-03 LAB — IRON AND TIBC
Iron: 79 ug/dL (ref 28–170)
Saturation Ratios: 38 % — ABNORMAL HIGH (ref 10.4–31.8)
TIBC: 210 ug/dL — ABNORMAL LOW (ref 250–450)
UIBC: 131 ug/dL

## 2019-04-03 LAB — FERRITIN: Ferritin: 130 ng/mL (ref 11–307)

## 2019-04-03 NOTE — Progress Notes (Signed)
Patient pre screened for office appointment, no questions or concerns today. Patient reminded of upcoming appointment time and date. 

## 2019-04-04 ENCOUNTER — Other Ambulatory Visit: Payer: Self-pay

## 2019-04-04 ENCOUNTER — Inpatient Hospital Stay: Payer: Medicare Other

## 2019-04-04 ENCOUNTER — Other Ambulatory Visit: Payer: Self-pay | Admitting: Family Medicine

## 2019-04-04 ENCOUNTER — Inpatient Hospital Stay (HOSPITAL_BASED_OUTPATIENT_CLINIC_OR_DEPARTMENT_OTHER): Payer: Medicare Other | Admitting: Oncology

## 2019-04-04 VITALS — BP 128/85 | HR 77 | Temp 97.7°F | Resp 18 | Wt 147.7 lb

## 2019-04-04 DIAGNOSIS — F419 Anxiety disorder, unspecified: Secondary | ICD-10-CM

## 2019-04-04 DIAGNOSIS — R197 Diarrhea, unspecified: Secondary | ICD-10-CM | POA: Diagnosis not present

## 2019-04-04 DIAGNOSIS — D509 Iron deficiency anemia, unspecified: Secondary | ICD-10-CM | POA: Diagnosis not present

## 2019-04-04 DIAGNOSIS — I6529 Occlusion and stenosis of unspecified carotid artery: Secondary | ICD-10-CM

## 2019-04-04 DIAGNOSIS — N189 Chronic kidney disease, unspecified: Secondary | ICD-10-CM | POA: Diagnosis not present

## 2019-04-04 NOTE — Telephone Encounter (Signed)
Requested medication (s) are due for refill today: yes  Requested medication (s) are on the active medication list: yes  Last refill: 03/06/19  Future visit scheduled:yes  Notes to clinic: not delegated   Requested Prescriptions  Pending Prescriptions Disp Refills   ALPRAZolam (XANAX) 1 MG tablet [Pharmacy Med Name: ALPRAZolam 1 MG Oral Tablet] 45 tablet 0    Sig: TAKE 1 TABLET BY MOUTH 1 TO 2 TIMES DAILY FOR ANXIETY      Not Delegated - Psychiatry:  Anxiolytics/Hypnotics Failed - 04/04/2019 10:53 AM      Failed - This refill cannot be delegated      Failed - Urine Drug Screen completed in last 360 days.      Passed - Valid encounter within last 6 months    Recent Outpatient Visits           3 weeks ago [place], Donald E, MD   1 month ago Chest pain, unspecified type   [facility] [name], MD   2 months ago Recurrent UTI   [place], [place], PA-C   3 months ago Type 2 diabetes mellitus with stage 3a chronic kidney disease, without long-term current use of insulin [facility])   [facility] [name], MD   5 months ago Helicobacter pylori gastritis   [facility] [name], MD       Future Appointments             In 6 days [place], Kathlene November, MD [facility]   In 2 months [place], Kirstie Peri, MD [facility] [place]   In 3 months [place], Kathlene November, MD [facility], [place]

## 2019-04-06 ENCOUNTER — Encounter: Payer: Self-pay | Admitting: Cardiovascular Disease

## 2019-04-06 ENCOUNTER — Ambulatory Visit (INDEPENDENT_AMBULATORY_CARE_PROVIDER_SITE_OTHER): Payer: Medicare Other | Admitting: Cardiovascular Disease

## 2019-04-06 ENCOUNTER — Other Ambulatory Visit: Payer: Self-pay

## 2019-04-06 VITALS — BP 118/90 | HR 105 | Ht 62.0 in | Wt 145.2 lb

## 2019-04-06 DIAGNOSIS — N183 Chronic kidney disease, stage 3 unspecified: Secondary | ICD-10-CM

## 2019-04-06 DIAGNOSIS — I25118 Atherosclerotic heart disease of native coronary artery with other forms of angina pectoris: Secondary | ICD-10-CM

## 2019-04-06 DIAGNOSIS — I6329 Cerebral infarction due to unspecified occlusion or stenosis of other precerebral arteries: Secondary | ICD-10-CM | POA: Diagnosis not present

## 2019-04-06 DIAGNOSIS — I739 Peripheral vascular disease, unspecified: Secondary | ICD-10-CM

## 2019-04-06 DIAGNOSIS — I255 Ischemic cardiomyopathy: Secondary | ICD-10-CM

## 2019-04-06 DIAGNOSIS — E785 Hyperlipidemia, unspecified: Secondary | ICD-10-CM | POA: Diagnosis not present

## 2019-04-06 DIAGNOSIS — I5032 Chronic diastolic (congestive) heart failure: Secondary | ICD-10-CM | POA: Diagnosis not present

## 2019-04-06 DIAGNOSIS — I6523 Occlusion and stenosis of bilateral carotid arteries: Secondary | ICD-10-CM

## 2019-04-06 NOTE — Progress Notes (Signed)
Date:  04/06/2019   ID:  [id number], [place] 1970-09-26, MRN [id number]  Patient Location:  [address] [place] [address]   Provider location:   [facility], [place] office  PCP:  [name], MD  Cardiologist:  Arvid Right [facility]  Chief Complaint  Patient presents with  . office visit    Pt states Metoprolol is causing BP to decrease taking 1/2 tablet. SOB w/o extersion. Still having history of dizziness. Meds verbally reviewed w/ pt.    History of Present Illness:    [name] is a 49 y.o. female past medical history of smoking,  strokes, 2010 chest pain dating back to 2005  CAD, Previous LAD stent  normal LV function,   s/p gastric bypass surgery,  Baseline creatinine 1.5, previously taken off her diuretic Catheterization May 2017 with stent placed to her first diagonal, there was residual 50% distal LAD disease, 30% proximal, 40% proximal circumflex Labile pressures Who presents for routine followup of her coronary artery disease  Reports that at home, BP running low, heart rate low Cut the metoprolol in 1/2 , better on 12.5 mg daily down from 25 Takes amlodipine 5 daily No significant leg edema  Has a procedure on 9th, stretch esophagus Having dyspgia, choking Off plavix 5 days in preparation  Feels she needs PT Leg and arms weak  Broke hip, lost balance, fell back  Sept 2020 Did PT, now walking  Lab work reviewed CR 2.28, BUN 37  EKG personally reviewed by myself on todays visit  Shows NSR rate 105  PVC   Prior CV studies:   The following studies were reviewed today:  Previous cardiac catheterization results discussed with her in detail Cardiac catheterization September 20, 2017 Significant one-vessel coronary artery disease with patent stents in the mid LAD and first diagonal.   significant chronic disease in the distal LAD close to the apex. Severe restenosis  ostial second diagonal at the site of  previous angioplasty in May 2019. Successful cutting balloon angioplasty of the ostial second diagonal without stent placement.    Past Medical History:  Diagnosis Date  . Acute pyelonephritis   . Anemia    iron deficiency anemia  . Aortic arch aneurysm ([place])   . Bipolar disorder ([place])   . BRCA negative 2014  . CAD (coronary artery disease)    a. 08/2003 Cath: LAD 30-40-med Rx; b. 11/2014 PCI: LAD 51m(3.25x23 Xience Alpine DES); c. 06/2015 PCI: D1 (2.25x12 Resolute Integrity DES); d. 06/2017 PCI: Patent mLAD stent, D2 95 (PTCA); e. 09/2017 PCI: D2 99ost (CBA); d. 12/2017 Cath: LM nl, LAD 373m80d (small), D1 40ost, D2 95ost, LCX 40p, RCA 40ost/p->Med rx for D2 given restenosis.  . CKD (chronic kidney disease), stage III   . Colon polyp   . CVA (cerebral vascular accident) ([place]   Left side weakness.   . Diabetes ([place]  . Family history of breast cancer    BRCA neg 2014  . Gastric ulcer 04/27/2011  . History of echocardiogram    a. 03/2017 Echo: EF 60-65%, no rwma; b. 02/2018 Echo: EF 60-65%, no rwma. Nl RV fxn. No cardiac source of emboli (admitted w/ stroke).  . [place] Kitchen[place] (hypertension)   . Hyperlipemia   . Hypothyroid   . Malignant melanoma of skin of scalp ([place]  . MI, acute, non ST segment elevation (H[place]  . Neuromuscular disorder ([place]  . Orthostatic hypotension   .  S/P drug eluting coronary stent placement 06/04/2015  . Sepsis ([place]) 03/18/2017  . Stroke [facility])    a. 02/2018 MRI: 39m late acute/early subacute L medial frontal lobe inarct; b. 02/2018 MRA No large vessel occlusion or aneurysm. Mod to sev L P2 stenosis. thready L vertebral artery, diffusely dzs'd; c. 02/2018 Carotid U/S: <50% bilat ICA dzs.   Past Surgical History:  Procedure Laterality Date  . APPENDECTOMY    . CARDIAC CATHETERIZATION N/A 11/09/2014   Procedure: Coronary Angiography;  Surgeon: TMinna Merritts MD;  [place];  Service: Cardiovascular;  Laterality: N/A;  . CARDIAC CATHETERIZATION  N/A 11/12/2014   Procedure: Coronary Stent Intervention;  Surgeon: AIsaias Cowman MD;  [place] [facility];  Service: Cardiovascular;  Laterality: N/A;  . CARDIAC CATHETERIZATION N/A 04/18/2015   Procedure: Left Heart Cath and Coronary Angiography;  Surgeon: TMinna Merritts MD;  [place];  Service: Cardiovascular;  Laterality: N/A;  . CARDIAC CATHETERIZATION Left 06/04/2015   Procedure: Left Heart Cath and Coronary Angiography;  Surgeon: MWellington Hampshire MD;  [place];  Service: Cardiovascular;  Laterality: Left;  . CARDIAC CATHETERIZATION N/A 06/04/2015   Procedure: Coronary Stent Intervention;  Surgeon: MWellington Hampshire MD;  [place];  Service: Cardiovascular;  Laterality: N/A;  . CESAREAN SECTION  2001  . CHOLECYSTECTOMY N/A 11/18/2016   Procedure: LAPAROSCOPIC CHOLECYSTECTOMY WITH INTRAOPERATIVE CHOLANGIOGRAM;  Surgeon: SChristene Lye MD;  [facility]: [facility];  Service: General;  Laterality: N/A;  . COLONOSCOPY WITH PROPOFOL N/A 04/27/2016   Procedure: COLONOSCOPY WITH PROPOFOL;  Surgeon: DLucilla Lame MD;  [place]  Service: Endoscopy;  Laterality: N/A;  . COLONOSCOPY WITH PROPOFOL N/A 01/12/2018   Procedure: COLONOSCOPY WITH PROPOFOL;  Surgeon: Toledo, TBenay Pike MD;  Location: [facility];  Service: Endoscopy;  Laterality: N/A;  . CORONARY ANGIOPLASTY    . CORONARY BALLOON ANGIOPLASTY N/A 06/29/2017   Procedure: CORONARY BALLOON ANGIOPLASTY;  Surgeon: AWellington Hampshire MD;  [place];  Service: Cardiovascular;  Laterality: N/A;  . CORONARY BALLOON ANGIOPLASTY N/A 09/20/2017   Procedure: CORONARY BALLOON ANGIOPLASTY;  Surgeon: AWellington Hampshire MD;  [place];  Service: Cardiovascular;  Laterality: N/A;  . DILATION AND CURETTAGE OF UTERUS    . ESOPHAGOGASTRODUODENOSCOPY (EGD) WITH PROPOFOL N/A 09/14/2014   Procedure:  ESOPHAGOGASTRODUODENOSCOPY (EGD) WITH PROPOFOL;  Surgeon: MJosefine Class MD;  [facility];  Service: Endoscopy;  Laterality: N/A;  . ESOPHAGOGASTRODUODENOSCOPY (EGD) WITH PROPOFOL N/A 04/27/2016   Procedure: ESOPHAGOGASTRODUODENOSCOPY (EGD) WITH PROPOFOL;  Surgeon: DLucilla Lame MD;  [place]  Service: Endoscopy;  Laterality: N/A;  Diabetic - oral meds  . ESOPHAGOGASTRODUODENOSCOPY (EGD) WITH PROPOFOL N/A 01/12/2018   Procedure: ESOPHAGOGASTRODUODENOSCOPY (EGD) WITH PROPOFOL;  Surgeon: Toledo, TBenay Pike MD;  Location: [facility];  Service: Endoscopy;  Laterality: N/A;  . GASTRIC BYPASS  09/2009   [place](IM) NAIL INTERTROCHANTERIC Left 10/13/2018   Procedure: INTRAMEDULLARY (IM) NAIL INTERTROCHANTRIC;  Surgeon: XLeandrew Koyanagi MD;  Location: [place]  Service: Orthopedics;  Laterality: Left;  . Left Carotid to sublcavian artery bypass w/ subclavian artery ligation     a. [place]  . LEFT HEART CATH AND CORONARY ANGIOGRAPHY Left 06/29/2017   Procedure: LEFT HEART CATH AND CORONARY ANGIOGRAPHY;  Surgeon: AWellington Hampshire MD;  [place];  Service: Cardiovascular;  Laterality: Left;  .  LEFT HEART CATH AND CORONARY ANGIOGRAPHY N/A 09/20/2017   Procedure: LEFT HEART CATH AND CORONARY ANGIOGRAPHY;  Surgeon: Wellington Hampshire, MD;  Location: [place] CV LAB;  Service: Cardiovascular;  Laterality: N/A;  . LEFT HEART CATH AND CORONARY ANGIOGRAPHY N/A 12/20/2017   Procedure: LEFT HEART CATH AND CORONARY ANGIOGRAPHY;  Surgeon: Wellington Hampshire, MD;  Location: [place] [facility] LAB;  Service: Cardiovascular;  Laterality: N/A;  . MELANOMA EXCISION  2016   Dr. Evorn Gong  . [place]  2002  . RIGHT OOPHORECTOMY    . SHOULDER ARTHROSCOPY WITH OPEN ROTATOR CUFF REPAIR Right 01/07/2016   Procedure: SHOULDER ARTHROSCOPY WITH DEBRIDMENT, SUBACHROMIAL DECOMPRESSION;  Surgeon: Corky Mull, MD;   Location: [facility];  Service: Orthopedics;  Laterality: Right;  . SHOULDER ARTHROSCOPY WITH OPEN ROTATOR CUFF REPAIR Right 03/16/2017   Procedure: SHOULDER ARTHROSCOPY WITH OPEN ROTATOR CUFF REPAIR POSSIBLE BICEPS TENODESIS;  Surgeon: Corky Mull, MD;  Location: [facility];  Service: Orthopedics;  Laterality: Right;  . TRIGGER FINGER RELEASE Right     Middle Finger      Allergies:   Lipitor [atorvastatin] and Tramadol   Social History   Tobacco Use  . Smoking status: Former Smoker    Types: Cigarettes    Quit date: 08/31/1994    Years since quitting: 24.6  . Smokeless tobacco: Never Used  . Tobacco comment: quit 28 years ago  Substance Use Topics  . Alcohol use: No    Alcohol/week: 0.0 standard drinks  . Drug use: No     Current Outpatient Medications on File Prior to Visit  Medication Sig Dispense Refill  . ALPRAZolam (XANAX) 1 MG tablet TAKE 1 TABLET BY MOUTH 1 TO 2 TIMES DAILY FOR ANXIETY 45 tablet 3  . amLODipine (NORVASC) 10 MG tablet Take 1 tablet (10 mg total) by mouth daily. (Patient taking differently: Take 5 mg by mouth daily. ) 30 tablet 0  . aspirin 81 MG EC tablet Take by mouth.    Marland Kitchen buPROPion (WELLBUTRIN XL) 300 MG 24 hr tablet Take 1 tablet (300 mg total) by mouth daily. 90 tablet 1  . clopidogrel (PLAVIX) 75 MG tablet Take 1 tablet (75 mg total) by mouth daily. 90 tablet 3  . diphenoxylate-atropine (LOMOTIL) 2.5-0.025 MG tablet Take 1-4 tablets by mouth daily. Take one capsule 1-4 times daily for diarrhea 30 tablet 5  . gabapentin (NEURONTIN) 300 MG capsule Take 1 capsule (300 mg total) by mouth 2 (two) times daily. 180 capsule 4  . Galcanezumab-gnlm (EMGALITY) 120 MG/ML SOAJ INJECT 240 MG SUBCUTANEOUSLY AS DIRECTED FOR THE FIRST MONTH.    Marland Kitchen levothyroxine (SYNTHROID, LEVOTHROID) 25 MCG tablet Take 1 tablet (25 mcg total) by mouth daily before breakfast. 30 tablet 12  . losartan (COZAAR) 50 MG tablet Take 1 tablet (50 mg total) by mouth daily. 30 tablet 0  .  metFORMIN (GLUCOPHAGE-XR) 500 MG 24 hr tablet Take 500 mg by mouth daily.    . metoprolol succinate (TOPROL-XL) 25 MG 24 hr tablet Take 25 mg by mouth daily. Taking 1/2 tablet daily    . nitroGLYCERIN (NITROSTAT) 0.4 MG SL tablet Place 1 tablet (0.4 mg total) under the tongue every 5 (five) minutes as needed for chest pain. 25 tablet 2  . pantoprazole (PROTONIX) 40 MG tablet Take 1 tablet (40 mg total) by mouth 2 (two) times daily. 60 tablet 5  . promethazine (PHENERGAN) 25 MG tablet Take 1 tablet (25 mg total) by mouth every 6 (six) hours as  needed for nausea or vomiting. 30 tablet 5  . rosuvastatin (CRESTOR) 40 MG tablet Take 1 tablet (40 mg total) by mouth daily. 90 tablet 4  . traZODone (DESYREL) 100 MG tablet Take 100 mg by mouth at bedtime.    Marland Kitchen venlafaxine XR (EFFEXOR-XR) 75 MG 24 hr capsule Take 1 capsule (75 mg total) by mouth daily with breakfast. 30 capsule 5   No current facility-administered medications on file prior to visit.     Family Hx: The patient's family history includes Anxiety disorder in her father and mother; Bipolar disorder in her mother; Breast cancer in an other family member; Breast cancer (age of onset: 51) in her maternal aunt and maternal aunt; Colon cancer in her cousin and father; Depression in her father, mother, and sister; Diabetes in her father and sister; Heart disease in her mother; Heart disease (age of onset: 23) in her father; Hyperlipidemia in her mother and sister; Hypertension in her father, mother, sister, and sister; Kidney disease in her father and sister; Ovarian cancer in her cousin; Skin cancer in her father; Stroke in her father; Thyroid nodules in her sister. There is no history of Kidney cancer or Bladder Cancer.  ROS:   Please see the history of present illness.    Review of Systems  Constitutional: Negative.   Respiratory: Negative.   Cardiovascular: Negative.   Gastrointestinal: Negative.        Dysphagia  Musculoskeletal: Negative.         Leg weakness  Neurological: Negative.   Psychiatric/Behavioral: Negative.   All other systems reviewed and are negative.    Labs/Other Tests and Data Reviewed:    Recent Labs: 01/20/2019: TSH 3.500 01/27/2019: B Natriuretic Peptide 40.0 02/07/2019: Magnesium 1.8 02/18/2019: ALT 16; BUN 37; Creatinine, Ser 2.28; Potassium 3.6; Sodium 138 04/03/2019: Hemoglobin 12.0; Platelets 185   Recent Lipid Panel Lab Results  Component Value Date/Time   CHOL 106 01/28/2019 03:30 AM   CHOL 159 05/28/2016 12:12 PM   CHOL 197 12/30/2013 03:55 AM   TRIG 52 01/28/2019 03:30 AM   TRIG 261 (H) 12/30/2013 03:55 AM   HDL 56 01/28/2019 03:30 AM   HDL 34 (L) 05/28/2016 12:12 PM   HDL 28 (L) 12/30/2013 03:55 AM   CHOLHDL 1.9 01/28/2019 03:30 AM   LDLCALC 40 01/28/2019 03:30 AM   LDLCALC 103 (H) 12/08/2016 12:21 PM   LDLCALC 117 (H) 12/30/2013 03:55 AM   LDLDIRECT 83.3 01/26/2011 09:13 AM    Wt Readings from Last 3 Encounters:  04/06/19 145 lb 4 oz (65.9 kg)  04/04/19 147 lb 11.2 oz (67 kg)  03/15/19 148 lb (67.1 kg)     Exam:    BP 118/90 (BP Location: Left Arm, Patient Position: Sitting, Cuff Size: Normal)   Pulse (!) 105   Ht '5\' 2"'  (1.575 m)   Wt 145 lb 4 oz (65.9 kg)   SpO2 98%   BMI 26.57 kg/m   Constitutional: Thin, appears pale Slowly getting up from chair to get to the exam table, weak HENT:  Head: Grossly normal Eyes:  no discharge. No scleral icterus.  Neck: No JVD, no carotid bruits  Cardiovascular: Regular rate and rhythm, no murmurs appreciated Pulmonary/Chest: Clear to auscultation bilaterally, no wheezes or rails Abdominal: Soft.  no distension.  no tenderness.  Musculoskeletal: Normal range of motion Neurological:  normal muscle tone. Coordination normal. No atrophy Skin: Skin warm and dry Psychiatric: normal affect, pleasant   ASSESSMENT & PLAN:    Preop cardiovascular  for GI procedure next week Except for [place] to hold Plavix 5 days Plan for esophageal  dilatation given her dysphagia No further cardiac testing needed  Hyponatremia Stable, 138  Atherosclerosis of native coronary artery of native heart with stable angina pectoris (HCC) No anginal symptoms, cholesterol at goal She is having some shortness of breath on exertion, may need further investigation if symptoms get worse Unable to exclude conditioning following recent fall, fracture, recovery  Near syncope No recent episodes Metoprolol down to 12.5 given her recorded low blood pressure and low heart rate If blood pressure continues to run low may need to decrease amlodipine  chronic systolic CHF (congestive heart failure) (HCC) Euvolemic,  Weight stable  CKD (chronic kidney disease), stage III (HCC) Stable, followed by nephrology Creatinine of 2  Essential hypertension Amlodipine 5 with metoprolol 12.5 Blood pressure running low, has been losing weight  History of CVA (cerebrovascular accident) Stable  Ischemic cardiomyopathy HBA1C 5.1, doing better No angina   Total encounter time more than 25 minutes  Greater than 50% was spent in counseling and coordination of care with the patient   Disposition: Follow-up in 6 months   Signed, Ida Rogue, MD  04/06/2019 2:43 PM    [place] Office [address] [place] [address], [place], [place] [address]

## 2019-04-06 NOTE — Patient Instructions (Signed)
Medication Instructions:  No changes  If you need a refill on your cardiac medications before your next appointment, please call your pharmacy.    Lab work: No new labs needed   If you have labs (blood work) drawn today and your tests are completely normal, you will receive your results only by: . MyChart Message (if you have MyChart) OR . A paper copy in the mail If you have any lab test that is abnormal or we need to change your treatment, we will call you to review the results.   Testing/Procedures: No new testing needed   Follow-Up: At [facility], you and your health needs are our priority.  As part of our continuing mission to provide you with exceptional heart care, we have created designated Provider Care Teams.  These Care Teams include your primary Cardiologist (physician) and Advanced Practice Providers (APPs -  Physician Assistants and Nurse Practitioners) who all work together to provide you with the care you need, when you need it.  . You will need a follow up appointment in 6 months   . Providers on your designated Care Team:   . Christopher Berge, NP . Ryan Dunn, PA-C . Jacquelyn Visser, PA-C  Any Other Special Instructions Will Be Listed Below (If Applicable).  For educational health videos Log in to : [email or web address] Or : [email or web address], password : [email or web address]   

## 2019-04-07 ENCOUNTER — Other Ambulatory Visit
Admission: RE | Admit: 2019-04-07 | Discharge: 2019-04-07 | Disposition: A | Discharge status: Home / Self Care | Payer: Medicare Other | Source: Ambulatory Visit | Attending: Gastroenterology | Admitting: Gastroenterology

## 2019-04-07 DIAGNOSIS — Z20822 Contact with and (suspected) exposure to covid-19: Secondary | ICD-10-CM | POA: Diagnosis not present

## 2019-04-07 DIAGNOSIS — Z01812 Encounter for preprocedural laboratory examination: Secondary | ICD-10-CM | POA: Diagnosis not present

## 2019-04-07 LAB — SARS CORONAVIRUS 2 (TAT 6-24 HRS): SARS Coronavirus 2: NEGATIVE

## 2019-04-08 ENCOUNTER — Other Ambulatory Visit: Payer: Self-pay | Admitting: Family Medicine

## 2019-04-08 DIAGNOSIS — E039 Hypothyroidism, unspecified: Secondary | ICD-10-CM

## 2019-04-08 NOTE — Telephone Encounter (Signed)
Requested Prescriptions  Pending Prescriptions Disp Refills  . EUTHYROX 25 MCG tablet [Pharmacy Med Name: Euthyrox 25 MCG Oral Tablet] 30 tablet 0    Sig: TAKE 1 TABLET BY MOUTH ONCE DAILY BEFORE BREAKFAST     Endocrinology:  Hypothyroid Agents Failed - 04/08/2019  7:50 AM      Failed - TSH needs to be rechecked within 3 months after an abnormal result. Refill until TSH is due.      Passed - TSH in normal range and within 360 days    TSH  Date Value Ref Range Status  01/20/2019 3.500 0.350 - 4.500 uIU/mL Final    Comment:    Performed by a 3rd Generation assay with a functional sensitivity of <=0.01 uIU/mL. Performed at [facility], [address] [place], [place] [address]   12/08/2016 1.53 mIU/L Final    Comment:              Reference Range .           > or = 20 Years  0.40-4.50 .                Pregnancy Ranges           First trimester    0.26-2.66           Second trimester   0.55-2.73           Third trimester    0.43-2.91          Passed - Valid encounter within last 12 months    Recent Outpatient Visits          4 weeks ago Kamrar, Donald E, MD   2 months ago Chest pain, unspecified type   [facility] [name], MD   2 months ago Recurrent UTI   [place], [place], PA-C   4 months ago Type 2 diabetes mellitus with stage 3a chronic kidney disease, without long-term current use of insulin [facility])   [facility] [name], MD   5 months ago Helicobacter pylori gastritis   [facility] [name], MD      Future Appointments            In 2 months Fisher, Kirstie Peri, MD [facility], [place]   In 6 months Gollan, Kathlene November, MD [facility], [place]

## 2019-04-10 ENCOUNTER — Ambulatory Visit: Payer: Medicare Other | Admitting: Cardiovascular Disease

## 2019-04-10 ENCOUNTER — Telehealth: Payer: Self-pay | Admitting: Gastroenterology

## 2019-04-10 NOTE — Telephone Encounter (Signed)
Patient called & states she is scheduled for upper EGD & colonoscopy for tomorrow. She states she was unaware of having the colonoscopy & is not prepped for it. Please call & clarify.

## 2019-04-10 NOTE — Telephone Encounter (Signed)
Contact pt and advised colonoscopy has been cancelled.

## 2019-04-11 ENCOUNTER — Ambulatory Visit: Payer: Medicare Other | Admitting: Anesthesiology

## 2019-04-11 ENCOUNTER — Ambulatory Visit
Admission: RE | Admit: 2019-04-11 | Discharge: 2019-04-11 | Disposition: A | Discharge status: Home / Self Care | Payer: Medicare Other | Attending: Gastroenterology | Admitting: Gastroenterology

## 2019-04-11 ENCOUNTER — Encounter
Admission: RE | Disposition: A | Discharge status: Home / Self Care | Payer: Self-pay | Source: Home / Self Care | Attending: Gastroenterology

## 2019-04-11 ENCOUNTER — Other Ambulatory Visit: Payer: Self-pay

## 2019-04-11 ENCOUNTER — Encounter: Payer: Self-pay | Admitting: Gastroenterology

## 2019-04-11 DIAGNOSIS — E1151 Type 2 diabetes mellitus with diabetic peripheral angiopathy without gangrene: Secondary | ICD-10-CM | POA: Diagnosis not present

## 2019-04-11 DIAGNOSIS — Z9884 Bariatric surgery status: Secondary | ICD-10-CM | POA: Diagnosis not present

## 2019-04-11 DIAGNOSIS — K529 Noninfective gastroenteritis and colitis, unspecified: Secondary | ICD-10-CM | POA: Diagnosis not present

## 2019-04-11 DIAGNOSIS — E785 Hyperlipidemia, unspecified: Secondary | ICD-10-CM | POA: Insufficient documentation

## 2019-04-11 DIAGNOSIS — E1140 Type 2 diabetes mellitus with diabetic neuropathy, unspecified: Secondary | ICD-10-CM | POA: Diagnosis not present

## 2019-04-11 DIAGNOSIS — I130 Hypertensive heart and chronic kidney disease with heart failure and stage 1 through stage 4 chronic kidney disease, or unspecified chronic kidney disease: Secondary | ICD-10-CM | POA: Insufficient documentation

## 2019-04-11 DIAGNOSIS — K219 Gastro-esophageal reflux disease without esophagitis: Secondary | ICD-10-CM | POA: Insufficient documentation

## 2019-04-11 DIAGNOSIS — I509 Heart failure, unspecified: Secondary | ICD-10-CM | POA: Diagnosis not present

## 2019-04-11 DIAGNOSIS — E1122 Type 2 diabetes mellitus with diabetic chronic kidney disease: Secondary | ICD-10-CM | POA: Insufficient documentation

## 2019-04-11 DIAGNOSIS — Z955 Presence of coronary angioplasty implant and graft: Secondary | ICD-10-CM | POA: Diagnosis not present

## 2019-04-11 DIAGNOSIS — F319 Bipolar disorder, unspecified: Secondary | ICD-10-CM | POA: Diagnosis not present

## 2019-04-11 DIAGNOSIS — Z885 Allergy status to narcotic agent status: Secondary | ICD-10-CM | POA: Insufficient documentation

## 2019-04-11 DIAGNOSIS — I252 Old myocardial infarction: Secondary | ICD-10-CM | POA: Insufficient documentation

## 2019-04-11 DIAGNOSIS — Z79899 Other long term (current) drug therapy: Secondary | ICD-10-CM | POA: Insufficient documentation

## 2019-04-11 DIAGNOSIS — Z7982 Long term (current) use of aspirin: Secondary | ICD-10-CM | POA: Insufficient documentation

## 2019-04-11 DIAGNOSIS — Z85828 Personal history of other malignant neoplasm of skin: Secondary | ICD-10-CM | POA: Diagnosis not present

## 2019-04-11 DIAGNOSIS — Z79890 Hormone replacement therapy (postmenopausal): Secondary | ICD-10-CM | POA: Diagnosis not present

## 2019-04-11 DIAGNOSIS — E039 Hypothyroidism, unspecified: Secondary | ICD-10-CM | POA: Diagnosis not present

## 2019-04-11 DIAGNOSIS — R1314 Dysphagia, pharyngoesophageal phase: Secondary | ICD-10-CM | POA: Insufficient documentation

## 2019-04-11 DIAGNOSIS — G709 Myoneural disorder, unspecified: Secondary | ICD-10-CM | POA: Diagnosis not present

## 2019-04-11 DIAGNOSIS — R197 Diarrhea, unspecified: Secondary | ICD-10-CM | POA: Diagnosis not present

## 2019-04-11 DIAGNOSIS — N183 Chronic kidney disease, stage 3 unspecified: Secondary | ICD-10-CM | POA: Insufficient documentation

## 2019-04-11 DIAGNOSIS — J449 Chronic obstructive pulmonary disease, unspecified: Secondary | ICD-10-CM | POA: Diagnosis not present

## 2019-04-11 DIAGNOSIS — R1310 Dysphagia, unspecified: Secondary | ICD-10-CM | POA: Diagnosis not present

## 2019-04-11 DIAGNOSIS — I2510 Atherosclerotic heart disease of native coronary artery without angina pectoris: Secondary | ICD-10-CM | POA: Insufficient documentation

## 2019-04-11 DIAGNOSIS — I69954 Hemiplegia and hemiparesis following unspecified cerebrovascular disease affecting left non-dominant side: Secondary | ICD-10-CM | POA: Diagnosis not present

## 2019-04-11 DIAGNOSIS — Z888 Allergy status to other drugs, medicaments and biological substances status: Secondary | ICD-10-CM | POA: Insufficient documentation

## 2019-04-11 DIAGNOSIS — R933 Abnormal findings on diagnostic imaging of other parts of digestive tract: Secondary | ICD-10-CM

## 2019-04-11 DIAGNOSIS — Z87891 Personal history of nicotine dependence: Secondary | ICD-10-CM | POA: Insufficient documentation

## 2019-04-11 DIAGNOSIS — Z7984 Long term (current) use of oral hypoglycemic drugs: Secondary | ICD-10-CM | POA: Insufficient documentation

## 2019-04-11 DIAGNOSIS — I5022 Chronic systolic (congestive) heart failure: Secondary | ICD-10-CM | POA: Diagnosis not present

## 2019-04-11 DIAGNOSIS — R1319 Other dysphagia: Secondary | ICD-10-CM

## 2019-04-11 HISTORY — PX: ESOPHAGOGASTRODUODENOSCOPY (EGD) WITH PROPOFOL: SHX5813

## 2019-04-11 LAB — GLUCOSE, CAPILLARY: Glucose-Capillary: 71 mg/dL (ref 70–99)

## 2019-04-11 SURGERY — ESOPHAGOGASTRODUODENOSCOPY (EGD) WITH PROPOFOL
Anesthesia: General

## 2019-04-11 MED ORDER — FENTANYL CITRATE (PF) 100 MCG/2ML IJ SOLN
INTRAMUSCULAR | Status: AC
  Filled 2019-04-11: qty 2

## 2019-04-11 MED ORDER — PROPOFOL 500 MG/50ML IV EMUL
INTRAVENOUS | Status: AC
  Filled 2019-04-11: qty 50

## 2019-04-11 MED ORDER — PROPOFOL 500 MG/50ML IV EMUL
INTRAVENOUS | Status: DC | PRN
  Administered 2019-04-11: 100 ug/kg/min via INTRAVENOUS

## 2019-04-11 MED ORDER — FENTANYL CITRATE (PF) 100 MCG/2ML IJ SOLN
INTRAMUSCULAR | Status: DC | PRN
  Administered 2019-04-11: 50 ug via INTRAVENOUS

## 2019-04-11 MED ORDER — SODIUM CHLORIDE 0.9 % IV SOLN
INTRAVENOUS | Status: DC
  Administered 2019-04-11: 08:00:00 1000 mL via INTRAVENOUS

## 2019-04-11 MED ORDER — MIDAZOLAM HCL 2 MG/2ML IJ SOLN
INTRAMUSCULAR | Status: DC | PRN
  Administered 2019-04-11: 1 mg via INTRAVENOUS

## 2019-04-11 MED ORDER — MIDAZOLAM HCL 2 MG/2ML IJ SOLN
INTRAMUSCULAR | Status: AC
  Filled 2019-04-11: qty 2

## 2019-04-11 NOTE — Transfer of Care (Signed)
Immediate Anesthesia Transfer of Care Note  Patient: [name]  Procedure(s) Performed: ESOPHAGOGASTRODUODENOSCOPY (EGD) WITH PROPOFOL (N/A )  Patient Location: PACU  Anesthesia Type:General  Level of Consciousness: awake and sedated  Airway & Oxygen Therapy: Patient Spontanous Breathing and Patient connected to nasal cannula oxygen  Post-op Assessment: Report given to RN and Post -op Vital signs reviewed and stable  Post vital signs: Reviewed and stable  Last Vitals:  Vitals Value Taken Time  BP 140/83 04/11/19 0856  Temp    Pulse 58 04/11/19 0900  Resp 14 04/11/19 0900  SpO2 100 % 04/11/19 0900  Vitals shown include unvalidated device data.  Last Pain:  Vitals:   04/11/19 0754  TempSrc: Tympanic  PainSc: 0-No pain         Complications: No apparent anesthesia complications

## 2019-04-11 NOTE — Anesthesia Preprocedure Evaluation (Addendum)
Anesthesia Evaluation  Patient identified by MRN, date of birth, ID band Patient awake    Reviewed: Allergy & Precautions, NPO status , Patient's Chart, lab work & pertinent test results  History of Anesthesia Complications Negative for: history of anesthetic complications  Airway Mallampati: II  TM Distance: >3 FB Neck ROM: Full    Dental  (+) Edentulous Lower, Edentulous Upper   Pulmonary neg pulmonary ROS, neg sleep apnea, neg COPD, Patient abstained from smoking.Not current smoker, former smoker,    Pulmonary exam normal breath sounds clear to auscultation       Cardiovascular Exercise Tolerance: Good METShypertension, + angina with exertion + CAD, + Past MI, + Cardiac Stents, + Peripheral Vascular Disease and +CHF  (-) dysrhythmias  Rhythm:Regular Rate:Normal - Systolic murmurs Recent hospitalization for chest pain/ hypertensive urgency. Started on lasix. Per follow up cardiologist note, patient is optimized for today's procedure with no further cardiac testing warranted.  Cardiac catheterization September 20, 2017 Significant one-vessel coronary artery disease with patent stents in the mid LAD and first diagonal.  significant chronic disease in the distal LAD close to the apex. Severe restenosis ostial second diagonal at the site of previous angioplasty in May 2019. Successful cutting balloon angioplasty of the ostial second diagonal without stent placement.   TTE 2020: Marland Kitchen Left ventricular ejection fraction, by visual estimation, is 60 to  65%. The left ventricle has normal function. There is no left ventricular  hypertrophy.  2. Left ventricular diastolic parameters are consistent with Grade I  diastolic dysfunction (impaired relaxation).  3. The left ventricle has no regional wall motion abnormalities.  4. Global right ventricle has normal systolic function.The right  ventricular size is normal. No increase in right  ventricular wall  thickness.  5. Left atrial size was normal.    Neuro/Psych  Headaches, PSYCHIATRIC DISORDERS Anxiety Depression CVA    GI/Hepatic PUD, GERD  Controlled,(+)     (-) substance abuse  ,   Endo/Other  diabetes, Type 2, Oral Hypoglycemic AgentsHypothyroidism   Renal/GU CRFRenal disease     Musculoskeletal   Abdominal   Peds  Hematology   Anesthesia Other Findings Past Medical History: No date: Acute pyelonephritis No date: Anemia     Comment:  iron deficiency anemia No date: Aortic arch aneurysm ([place]) 2014: BRCA negative No date: CAD (coronary artery disease)     Comment:  a. 08/2003 Cath: LAD 30-40-med Rx; b. 11/2014 PCI: LAD               40m(3.25x23 Xience Alpine DES); c. 06/2015 PCI: D1               (2.25x12 Resolute Integrity DES); d. 06/2017 PCI: Patent               mLAD stent, D2 95 (PTCA); e. 09/2017 PCI: D2 99ost (CBA);               d. 12/2017 Cath: LM nl, LAD 339m80d (small), D1 40ost,               D2 95ost, LCX 40p, RCA 40ost/p->Med rx for D2 given               restenosis. No date: CKD (chronic kidney disease), stage III No date: Colon polyp No date: CVA (cerebral vascular accident) ([place]    Comment:  Left side weakness.  No date: Diabetes ([facility] [place] date: Family history of breast cancer     Comment:  BRCA neg  2014 04/27/2011: Gastric ulcer No date: History of echocardiogram     Comment:  a. 03/2017 Echo: EF 60-65%, no rwma; b. 02/2018 Echo: EF               60-65%, no rwma. Nl RV fxn. No cardiac source of emboli               (admitted w/ stroke). No date: HTN (hypertension) No date: Hyperlipemia No date: Hypothyroid No date: Malignant melanoma of skin of scalp (HCC) No date: MI, acute, non ST segment elevation (HCC) No date: Neuromuscular disorder ([place]) No date: Orthostatic hypotension 06/04/2015: S/P drug eluting coronary stent placement 03/18/2017: Sepsis ([place]) No date: Stroke [facility])     Comment:  a. 02/2018 MRI: 60m late  acute/early subacute L medial               frontal lobe inarct; b. 02/2018 MRA No large vessel               occlusion or aneurysm. Mod to sev L P2 stenosis. thready               L vertebral artery, diffusely dzs'd; c. 02/2018 Carotid               U/S: <50% bilat ICA dzs.  Reproductive/Obstetrics                            Anesthesia Physical Anesthesia Plan  ASA: III  Anesthesia Plan: General   Post-op Pain Management:    Induction: Intravenous  PONV Risk Score and Plan: 3 and Ondansetron, Propofol infusion and TIVA  Airway Management Planned: Nasal Cannula  Additional Equipment: None  Intra-op Plan:   Post-operative Plan:   Informed Consent: I have reviewed the patients History and Physical, chart, labs and discussed the procedure including the risks, benefits and alternatives for the proposed anesthesia with the patient or authorized representative who has indicated his/her understanding and acceptance.     Dental advisory given  Plan Discussed with: CRNA and Surgeon  Anesthesia Plan Comments: (Discussed risks of anesthesia with patient, including possibility of difficulty with spontaneous ventilation under anesthesia necessitating airway intervention, PONV, and rare risks such as cardiac or respiratory or neurological events. Patient understands. Patient counseled on being high risk for anesthetic. Patient was told about increased risk of cardiac and respiratory events, including death. Patient understands. )        Anesthesia Quick Evaluation

## 2019-04-11 NOTE — Op Note (Signed)
[facility] Patient Name: [name] Procedure Date: 04/11/2019 8:26 AM MRN: [id number] Account #: [id number] Date of Birth: 03-Jan-1971 Admit Type: Outpatient Age: 49 Room: [facility] ENDO ROOM 4 Gender: Female Note Status: Finalized Procedure:             Upper GI endoscopy Indications:           Dysphagia, Abnormal UGI series Providers:             [name] MD, MD Medicines:             Propofol per Anesthesia Complications:         No immediate complications. Procedure:             Pre-Anesthesia Assessment:                        - Prior to the procedure, a History and Physical was                         performed, and patient medications and allergies were                         reviewed. The patient's tolerance of previous                         anesthesia was also reviewed. The risks and benefits                         of the procedure and the sedation options and risks                         were discussed with the patient. All questions were                         answered, and informed consent was obtained. Prior                         Anticoagulants: The patient has taken no previous                         anticoagulant or antiplatelet agents. ASA Grade                         Assessment: II - A patient with mild systemic disease.                         After reviewing the risks and benefits, the patient                         was deemed in satisfactory condition to undergo the                         procedure.                        After obtaining informed consent, the endoscope was                         passed under direct vision. Throughout the procedure,  the patient's blood pressure, pulse, and oxygen                         saturations were monitored continuously. The Endoscope                         was introduced through the mouth, and advanced to the                         jejunum. The  upper GI endoscopy was accomplished                         without difficulty. The patient tolerated the                         procedure well. Findings:      The examined esophagus was normal. Two biopsies were obtained in the       middle third of the esophagus with cold forceps for histology.      Evidence of a gastric bypass was found. A gastric pouch was found. The       gastrojejunal anastomosis was characterized by healthy appearing mucosa.       This was traversed. The pouch-to-jejunum limb was characterized by       healthy appearing mucosa. The jejunojejunal anastomosis was       characterized by healthy appearing mucosa.      The examined jejunum was normal. Impression:            - Normal esophagus.                        - Gastric bypass. Gastrojejunal anastomosis                         characterized by healthy appearing mucosa.                        - Normal examined jejunum.                        - Two biopsies were obtained in the middle third of                         the esophagus. Recommendation:        - Discharge patient to home.                        - Resume previous diet.                        - Continue present medications.                        - Await pathology results. Procedure Code(s):     --- Professional ---                        [phone], Esophagogastroduodenoscopy, flexible,                         transoral; with biopsy, single or multiple Diagnosis Code(s):     --- Professional ---  R13.10, Dysphagia, unspecified                        R93.3, Abnormal findings on diagnostic imaging of                         other parts of digestive tract                        Z98.84, Bariatric surgery status CPT copyright 2019 [employer]. All rights reserved. The codes documented in this report are preliminary and upon coder review may  be revised to meet current compliance requirements. [name] MD, MD 04/11/2019  8:51:36 AM This report has been signed electronically. Number of Addenda: 0 Note Initiated On: 04/11/2019 8:26 AM Estimated Blood Loss:  Estimated blood loss: none.      [facility]

## 2019-04-11 NOTE — Anesthesia Postprocedure Evaluation (Signed)
Anesthesia Post Note  Patient: [name]  Procedure(s) Performed: ESOPHAGOGASTRODUODENOSCOPY (EGD) WITH PROPOFOL (N/A )  Patient location during evaluation: Endoscopy Anesthesia Type: General Level of consciousness: awake and alert Pain management: pain level controlled Vital Signs Assessment: post-procedure vital signs reviewed and stable Respiratory status: spontaneous breathing, nonlabored ventilation, respiratory function stable and patient connected to nasal cannula oxygen Cardiovascular status: blood pressure returned to baseline and stable Postop Assessment: no apparent nausea or vomiting Anesthetic complications: no     Last Vitals:  Vitals:   04/11/19 0910 04/11/19 0920  BP: (!) 162/93 (!) 151/95  Pulse:  60  Resp:  14  Temp:    SpO2:  100%    Last Pain:  Vitals:   04/11/19 0900  TempSrc: Temporal  PainSc:                  Arita Miss

## 2019-04-11 NOTE — Interval H&P Note (Signed)
History and Physical Interval Note:  04/11/2019 8:23 AM  [name]  has presented today for surgery, with the diagnosis of Diarrhea R19.7 Dysphagia R13.10.  The various methods of treatment have been discussed with the patient and family. After consideration of risks, benefits and other options for treatment, the patient has consented to  Procedure(s): ESOPHAGOGASTRODUODENOSCOPY (EGD) WITH PROPOFOL (N/A) as a surgical intervention.  The patient's history has been reviewed, patient examined, no change in status, stable for surgery.  I have reviewed the patient's chart and labs.  Questions were answered to the patient's satisfaction.     Sherley Mckenney [employer]

## 2019-04-12 ENCOUNTER — Encounter: Payer: Self-pay | Admitting: *Deleted

## 2019-04-12 LAB — SURGICAL PATHOLOGY

## 2019-04-13 ENCOUNTER — Encounter: Payer: Self-pay | Admitting: Gastroenterology

## 2019-04-18 ENCOUNTER — Inpatient Hospital Stay
Admission: EM | Admit: 2019-04-18 | Discharge: 2019-04-21 | DRG: 287 | Disposition: A | Discharge status: Home / Self Care | Payer: Medicare Other | Attending: Internal Medicine | Admitting: Internal Medicine

## 2019-04-18 ENCOUNTER — Other Ambulatory Visit: Payer: Self-pay

## 2019-04-18 ENCOUNTER — Emergency Department: Payer: Medicare Other

## 2019-04-18 ENCOUNTER — Telehealth: Payer: Self-pay

## 2019-04-18 DIAGNOSIS — Z833 Family history of diabetes mellitus: Secondary | ICD-10-CM

## 2019-04-18 DIAGNOSIS — Z79899 Other long term (current) drug therapy: Secondary | ICD-10-CM

## 2019-04-18 DIAGNOSIS — R945 Abnormal results of liver function studies: Secondary | ICD-10-CM | POA: Diagnosis present

## 2019-04-18 DIAGNOSIS — E1122 Type 2 diabetes mellitus with diabetic chronic kidney disease: Secondary | ICD-10-CM | POA: Diagnosis present

## 2019-04-18 DIAGNOSIS — Z800 Family history of malignant neoplasm of digestive organs: Secondary | ICD-10-CM

## 2019-04-18 DIAGNOSIS — I248 Other forms of acute ischemic heart disease: Secondary | ICD-10-CM | POA: Diagnosis present

## 2019-04-18 DIAGNOSIS — R079 Chest pain, unspecified: Secondary | ICD-10-CM | POA: Diagnosis present

## 2019-04-18 DIAGNOSIS — I2510 Atherosclerotic heart disease of native coronary artery without angina pectoris: Secondary | ICD-10-CM | POA: Diagnosis present

## 2019-04-18 DIAGNOSIS — N1832 Chronic kidney disease, stage 3b: Secondary | ICD-10-CM

## 2019-04-18 DIAGNOSIS — I639 Cerebral infarction, unspecified: Secondary | ICD-10-CM | POA: Diagnosis not present

## 2019-04-18 DIAGNOSIS — I25119 Atherosclerotic heart disease of native coronary artery with unspecified angina pectoris: Secondary | ICD-10-CM | POA: Diagnosis present

## 2019-04-18 DIAGNOSIS — E440 Moderate protein-calorie malnutrition: Secondary | ICD-10-CM | POA: Diagnosis not present

## 2019-04-18 DIAGNOSIS — Z20822 Contact with and (suspected) exposure to covid-19: Secondary | ICD-10-CM | POA: Diagnosis present

## 2019-04-18 DIAGNOSIS — R0989 Other specified symptoms and signs involving the circulatory and respiratory systems: Secondary | ICD-10-CM

## 2019-04-18 DIAGNOSIS — E785 Hyperlipidemia, unspecified: Secondary | ICD-10-CM | POA: Diagnosis present

## 2019-04-18 DIAGNOSIS — Z8673 Personal history of transient ischemic attack (TIA), and cerebral infarction without residual deficits: Secondary | ICD-10-CM

## 2019-04-18 DIAGNOSIS — Z803 Family history of malignant neoplasm of breast: Secondary | ICD-10-CM

## 2019-04-18 DIAGNOSIS — I160 Hypertensive urgency: Secondary | ICD-10-CM | POA: Diagnosis not present

## 2019-04-18 DIAGNOSIS — E039 Hypothyroidism, unspecified: Secondary | ICD-10-CM | POA: Diagnosis not present

## 2019-04-18 DIAGNOSIS — R7989 Other specified abnormal findings of blood chemistry: Secondary | ICD-10-CM | POA: Diagnosis present

## 2019-04-18 DIAGNOSIS — I10 Essential (primary) hypertension: Secondary | ICD-10-CM | POA: Diagnosis not present

## 2019-04-18 DIAGNOSIS — I130 Hypertensive heart and chronic kidney disease with heart failure and stage 1 through stage 4 chronic kidney disease, or unspecified chronic kidney disease: Secondary | ICD-10-CM | POA: Diagnosis not present

## 2019-04-18 DIAGNOSIS — Z87891 Personal history of nicotine dependence: Secondary | ICD-10-CM

## 2019-04-18 DIAGNOSIS — Z85820 Personal history of malignant melanoma of skin: Secondary | ICD-10-CM

## 2019-04-18 DIAGNOSIS — I252 Old myocardial infarction: Secondary | ICD-10-CM

## 2019-04-18 DIAGNOSIS — Z955 Presence of coronary angioplasty implant and graft: Secondary | ICD-10-CM

## 2019-04-18 DIAGNOSIS — Z90721 Acquired absence of ovaries, unilateral: Secondary | ICD-10-CM

## 2019-04-18 DIAGNOSIS — E1121 Type 2 diabetes mellitus with diabetic nephropathy: Secondary | ICD-10-CM | POA: Diagnosis not present

## 2019-04-18 DIAGNOSIS — E860 Dehydration: Secondary | ICD-10-CM | POA: Diagnosis present

## 2019-04-18 DIAGNOSIS — K219 Gastro-esophageal reflux disease without esophagitis: Secondary | ICD-10-CM | POA: Diagnosis present

## 2019-04-18 DIAGNOSIS — I25110 Atherosclerotic heart disease of native coronary artery with unstable angina pectoris: Principal | ICD-10-CM | POA: Diagnosis present

## 2019-04-18 DIAGNOSIS — F418 Other specified anxiety disorders: Secondary | ICD-10-CM | POA: Diagnosis not present

## 2019-04-18 DIAGNOSIS — N184 Chronic kidney disease, stage 4 (severe): Secondary | ICD-10-CM | POA: Diagnosis present

## 2019-04-18 DIAGNOSIS — N179 Acute kidney failure, unspecified: Secondary | ICD-10-CM | POA: Diagnosis not present

## 2019-04-18 DIAGNOSIS — I5032 Chronic diastolic (congestive) heart failure: Secondary | ICD-10-CM | POA: Diagnosis present

## 2019-04-18 DIAGNOSIS — Z9884 Bariatric surgery status: Secondary | ICD-10-CM

## 2019-04-18 DIAGNOSIS — Z823 Family history of stroke: Secondary | ICD-10-CM

## 2019-04-18 DIAGNOSIS — Z6825 Body mass index (BMI) 25.0-25.9, adult: Secondary | ICD-10-CM

## 2019-04-18 DIAGNOSIS — Z8719 Personal history of other diseases of the digestive system: Secondary | ICD-10-CM

## 2019-04-18 DIAGNOSIS — Z8711 Personal history of peptic ulcer disease: Secondary | ICD-10-CM

## 2019-04-18 DIAGNOSIS — Z808 Family history of malignant neoplasm of other organs or systems: Secondary | ICD-10-CM

## 2019-04-18 DIAGNOSIS — Z8041 Family history of malignant neoplasm of ovary: Secondary | ICD-10-CM

## 2019-04-18 DIAGNOSIS — Z818 Family history of other mental and behavioral disorders: Secondary | ICD-10-CM

## 2019-04-18 DIAGNOSIS — F419 Anxiety disorder, unspecified: Secondary | ICD-10-CM

## 2019-04-18 DIAGNOSIS — Z8249 Family history of ischemic heart disease and other diseases of the circulatory system: Secondary | ICD-10-CM

## 2019-04-18 DIAGNOSIS — Z841 Family history of disorders of kidney and ureter: Secondary | ICD-10-CM

## 2019-04-18 DIAGNOSIS — Z7984 Long term (current) use of oral hypoglycemic drugs: Secondary | ICD-10-CM

## 2019-04-18 DIAGNOSIS — Z8349 Family history of other endocrine, nutritional and metabolic diseases: Secondary | ICD-10-CM

## 2019-04-18 LAB — COMPREHENSIVE METABOLIC PANEL
ALT: 51 U/L — ABNORMAL HIGH (ref 0–44)
AST: 78 U/L — ABNORMAL HIGH (ref 15–41)
Albumin: 3.8 g/dL (ref 3.5–5.0)
Alkaline Phosphatase: 132 U/L — ABNORMAL HIGH (ref 38–126)
Anion gap: 11 (ref 5–15)
BUN: 14 mg/dL (ref 6–20)
CO2: 26 mmol/L (ref 22–32)
Calcium: 8.9 mg/dL (ref 8.9–10.3)
Chloride: 105 mmol/L (ref 98–111)
Creatinine, Ser: 1.38 mg/dL — ABNORMAL HIGH (ref 0.44–1.00)
GFR calc Af Amer: 52 mL/min — ABNORMAL LOW (ref >60)
GFR calc non Af Amer: 44 mL/min — ABNORMAL LOW (ref >60)
Glucose, Bld: 74 mg/dL (ref 70–99)
Potassium: 3.6 mmol/L (ref 3.5–5.1)
Sodium: 142 mmol/L (ref 135–145)
Total Bilirubin: 0.7 mg/dL (ref 0.3–1.2)
Total Protein: 7.1 g/dL (ref 6.5–8.1)

## 2019-04-18 LAB — CBC
HCT: 40.9 % (ref 36.0–46.0)
Hemoglobin: 13.5 g/dL (ref 12.0–15.0)
MCH: 26.9 pg (ref 26.0–34.0)
MCHC: 33 g/dL (ref 30.0–36.0)
MCV: 81.6 fL (ref 80.0–100.0)
Platelets: 182 10*3/uL (ref 150–400)
RBC: 5.01 MIL/uL (ref 3.87–5.11)
RDW: 17 % — ABNORMAL HIGH (ref 11.5–15.5)
WBC: 8.9 10*3/uL (ref 4.0–10.5)
nRBC: 0 % (ref 0.0–0.2)

## 2019-04-18 LAB — BRAIN NATRIURETIC PEPTIDE: B Natriuretic Peptide: 114 pg/mL — ABNORMAL HIGH (ref 0.0–100.0)

## 2019-04-18 LAB — HEPATITIS PANEL, ACUTE
HCV Ab: NONREACTIVE
Hep A IgM: NONREACTIVE
Hep B C IgM: NONREACTIVE
Hepatitis B Surface Ag: NONREACTIVE

## 2019-04-18 LAB — TROPONIN I (HIGH SENSITIVITY)
Troponin I (High Sensitivity): 8 ng/L (ref <18)
Troponin I (High Sensitivity): 8 ng/L (ref <18)
Troponin I (High Sensitivity): 8 ng/L (ref <18)

## 2019-04-18 LAB — RESPIRATORY PANEL BY RT PCR (FLU A&B, COVID)
Influenza A by PCR: NEGATIVE
Influenza B by PCR: NEGATIVE
SARS Coronavirus 2 by RT PCR: NEGATIVE

## 2019-04-18 LAB — URINE DRUG SCREEN, QUALITATIVE (ARMC ONLY)
Amphetamines, Ur Screen: NOT DETECTED
Barbiturates, Ur Screen: NOT DETECTED
Benzodiazepine, Ur Scrn: POSITIVE — AB
Cannabinoid 50 Ng, Ur ~~LOC~~: NOT DETECTED
Cocaine Metabolite,Ur ~~LOC~~: NOT DETECTED
MDMA (Ecstasy)Ur Screen: NOT DETECTED
Methadone Scn, Ur: NOT DETECTED
Opiate, Ur Screen: POSITIVE — AB
Phencyclidine (PCP) Ur S: NOT DETECTED
Tricyclic, Ur Screen: NOT DETECTED

## 2019-04-18 LAB — PREGNANCY, URINE: Preg Test, Ur: NEGATIVE

## 2019-04-18 LAB — GLUCOSE, CAPILLARY
Glucose-Capillary: 134 mg/dL — ABNORMAL HIGH (ref 70–99)
Glucose-Capillary: 91 mg/dL (ref 70–99)

## 2019-04-18 LAB — HIV ANTIBODY (ROUTINE TESTING W REFLEX): HIV Screen 4th Generation wRfx: NONREACTIVE

## 2019-04-18 MED ORDER — ALPRAZOLAM 0.5 MG PO TABS
1.0000 mg | ORAL_TABLET | Freq: Two times a day (BID) | ORAL | Status: DC | PRN
  Administered 2019-04-18 – 2019-04-20 (×3): 1 mg via ORAL
  Filled 2019-04-18 (×3): qty 2

## 2019-04-18 MED ORDER — INSULIN ASPART 100 UNIT/ML ~~LOC~~ SOLN: 0.0000 [IU] | Freq: Every day | SUBCUTANEOUS | Status: DC

## 2019-04-18 MED ORDER — CLOPIDOGREL BISULFATE 75 MG PO TABS
75.0000 mg | ORAL_TABLET | Freq: Every day | ORAL | Status: DC
  Administered 2019-04-19 – 2019-04-21 (×3): 75 mg via ORAL
  Filled 2019-04-18 (×3): qty 1

## 2019-04-18 MED ORDER — AMLODIPINE BESYLATE 5 MG PO TABS
5.0000 mg | ORAL_TABLET | Freq: Every day | ORAL | Status: DC
  Administered 2019-04-18 – 2019-04-19 (×2): 5 mg via ORAL
  Filled 2019-04-18: qty 1

## 2019-04-18 MED ORDER — METOPROLOL TARTRATE 25 MG PO TABS
25.0000 mg | ORAL_TABLET | Freq: Once | ORAL | Status: AC
  Administered 2019-04-18: 25 mg via ORAL
  Filled 2019-04-18: qty 1

## 2019-04-18 MED ORDER — MORPHINE SULFATE (PF) 4 MG/ML IV SOLN
4.0000 mg | Freq: Once | INTRAVENOUS | Status: AC
  Administered 2019-04-18: 4 mg via INTRAVENOUS
  Filled 2019-04-18: qty 1

## 2019-04-18 MED ORDER — ISOSORBIDE MONONITRATE ER 60 MG PO TB24
60.0000 mg | ORAL_TABLET | Freq: Every day | ORAL | Status: DC
  Administered 2019-04-19: 60 mg via ORAL
  Filled 2019-04-18: qty 1

## 2019-04-18 MED ORDER — HYDRALAZINE HCL 20 MG/ML IJ SOLN
5.0000 mg | INTRAMUSCULAR | Status: DC | PRN
  Administered 2019-04-18: 5 mg via INTRAVENOUS
  Filled 2019-04-18 (×2): qty 1

## 2019-04-18 MED ORDER — LEVOTHYROXINE SODIUM 25 MCG PO TABS
25.0000 ug | ORAL_TABLET | Freq: Every day | ORAL | Status: DC
  Administered 2019-04-19 – 2019-04-21 (×3): 25 ug via ORAL
  Filled 2019-04-18 (×3): qty 1

## 2019-04-18 MED ORDER — ONDANSETRON HCL 4 MG/2ML IJ SOLN
4.0000 mg | Freq: Once | INTRAMUSCULAR | Status: AC
  Administered 2019-04-18: 4 mg via INTRAVENOUS
  Filled 2019-04-18: qty 2

## 2019-04-18 MED ORDER — BUPROPION HCL ER (XL) 150 MG PO TB24
300.0000 mg | ORAL_TABLET | Freq: Every day | ORAL | Status: DC
  Administered 2019-04-18 – 2019-04-21 (×4): 300 mg via ORAL
  Filled 2019-04-18 (×4): qty 2

## 2019-04-18 MED ORDER — METOPROLOL SUCCINATE ER 25 MG PO TB24
12.5000 mg | ORAL_TABLET | Freq: Every day | ORAL | Status: DC
  Filled 2019-04-18: qty 0.5

## 2019-04-18 MED ORDER — ISOSORBIDE MONONITRATE ER 60 MG PO TB24
60.0000 mg | ORAL_TABLET | Freq: Once | ORAL | Status: AC
  Administered 2019-04-18: 60 mg via ORAL
  Filled 2019-04-18: qty 1

## 2019-04-18 MED ORDER — MORPHINE SULFATE (PF) 2 MG/ML IV SOLN
2.0000 mg | INTRAVENOUS | Status: DC | PRN
  Administered 2019-04-18: 2 mg via INTRAVENOUS
  Filled 2019-04-18: qty 1

## 2019-04-18 MED ORDER — VENLAFAXINE HCL ER 75 MG PO CP24
75.0000 mg | ORAL_CAPSULE | Freq: Every day | ORAL | Status: DC
  Administered 2019-04-19 – 2019-04-21 (×3): 75 mg via ORAL
  Filled 2019-04-18 (×4): qty 1

## 2019-04-18 MED ORDER — NITROGLYCERIN 0.4 MG SL SUBL
0.4000 mg | SUBLINGUAL_TABLET | Freq: Once | SUBLINGUAL | Status: AC
  Administered 2019-04-18: 0.4 mg via SUBLINGUAL
  Filled 2019-04-18: qty 1

## 2019-04-18 MED ORDER — HEPARIN SODIUM (PORCINE) 5000 UNIT/ML IJ SOLN
5000.0000 [IU] | Freq: Three times a day (TID) | INTRAMUSCULAR | Status: DC
  Administered 2019-04-18 – 2019-04-19 (×2): 5000 [IU] via SUBCUTANEOUS
  Filled 2019-04-18 (×2): qty 1

## 2019-04-18 MED ORDER — INSULIN ASPART 100 UNIT/ML ~~LOC~~ SOLN: 0.0000 [IU] | Freq: Three times a day (TID) | SUBCUTANEOUS | Status: DC

## 2019-04-18 MED ORDER — LOSARTAN POTASSIUM 25 MG PO TABS
25.0000 mg | ORAL_TABLET | Freq: Every day | ORAL | Status: DC
  Administered 2019-04-18 – 2019-04-21 (×4): 25 mg via ORAL
  Filled 2019-04-18 (×3): qty 1

## 2019-04-18 MED ORDER — DIPHENOXYLATE-ATROPINE 2.5-0.025 MG PO TABS
1.0000 | ORAL_TABLET | Freq: Every day | ORAL | Status: DC
  Administered 2019-04-18 – 2019-04-20 (×3): 1 via ORAL
  Filled 2019-04-18 (×4): qty 1

## 2019-04-18 MED ORDER — ONDANSETRON HCL 4 MG/2ML IJ SOLN
4.0000 mg | Freq: Three times a day (TID) | INTRAMUSCULAR | Status: DC | PRN
  Administered 2019-04-18 – 2019-04-20 (×4): 4 mg via INTRAVENOUS
  Filled 2019-04-18 (×4): qty 2

## 2019-04-18 MED ORDER — TRAZODONE HCL 100 MG PO TABS
100.0000 mg | ORAL_TABLET | Freq: Every evening | ORAL | Status: DC | PRN
  Administered 2019-04-18 – 2019-04-20 (×3): 100 mg via ORAL
  Filled 2019-04-18 (×3): qty 1

## 2019-04-18 MED ORDER — GABAPENTIN 300 MG PO CAPS
300.0000 mg | ORAL_CAPSULE | Freq: Two times a day (BID) | ORAL | Status: DC
  Administered 2019-04-18 – 2019-04-21 (×6): 300 mg via ORAL
  Filled 2019-04-18 (×6): qty 1

## 2019-04-18 MED ORDER — NITROGLYCERIN 0.4 MG SL SUBL: 0.4000 mg | SUBLINGUAL_TABLET | SUBLINGUAL | Status: DC | PRN

## 2019-04-18 MED ORDER — NITROGLYCERIN 2 % TD OINT
1.0000 [in_us] | TOPICAL_OINTMENT | Freq: Four times a day (QID) | TRANSDERMAL | Status: DC
  Administered 2019-04-18 – 2019-04-20 (×8): 1 [in_us] via TOPICAL
  Filled 2019-04-18 (×6): qty 1

## 2019-04-18 MED ORDER — PANTOPRAZOLE SODIUM 40 MG PO TBEC
40.0000 mg | DELAYED_RELEASE_TABLET | Freq: Two times a day (BID) | ORAL | Status: DC
  Administered 2019-04-18 – 2019-04-21 (×6): 40 mg via ORAL
  Filled 2019-04-18 (×6): qty 1

## 2019-04-18 MED ORDER — ASPIRIN EC 81 MG PO TBEC
81.0000 mg | DELAYED_RELEASE_TABLET | Freq: Every day | ORAL | Status: DC
  Administered 2019-04-19 – 2019-04-21 (×2): 81 mg via ORAL
  Filled 2019-04-18 (×2): qty 1

## 2019-04-18 MED ORDER — HYDRALAZINE HCL 20 MG/ML IJ SOLN
5.0000 mg | INTRAMUSCULAR | Status: DC | PRN
  Filled 2019-04-18: qty 1

## 2019-04-18 NOTE — ED Notes (Signed)
Pt up to toilet 

## 2019-04-18 NOTE — Consult Note (Signed)
Cardiology Consultation:   Patient ID: [name] MRN: [id number]; DOB: November 30, 1970  Admit date: 04/18/2019 Date of Consult: 04/18/2019  Primary Care Provider: [name], MD Primary Cardiologist: [name], MD  Primary Electrophysiologist:  None    Patient Profile:   [name] is a 49 y.o. 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 Clyde Lundborg.  History of Present Illness:   [name] awoke at 5 AM today with central chest pain that she describes as "being kicked in the chest by a horse."  She had associated diaphoresis and nausea.  She took sublingual nitroglycerin with mild improvement in pain.  About an hour later, she took a second sublingual nitroglycerin with only slight improvement in the discomfort.  She therefore presented to the emergency department for further evaluation.  [name] notes that she did not feel well all day yesterday, lying in bed due to headaches, nausea, and diaphoresis.  She denies fevers, chills, cough, and any worsening in her baseline shortness of breath that is primarily with exertion.  She underwent upper endoscopy last week for which clopidogrel was temporarily held.  She restarted clopidogrel the night after her procedure and has been compliant with it since then.  [name] notes that her blood pressure has been labile in the past.  It often bottoms out after she is started on additional medications in the setting of hypertensive urgency when she is seen in  the hospital.  She currently has waxing and waning chest discomfort, albeit less severe than when she awoke this morning.  Her biggest concerns are continued diaphoresis and nausea.   Past Medical History:  Diagnosis Date  . Acute pyelonephritis   . Anemia    iron deficiency anemia  . Aortic arch aneurysm (HCC)   . BRCA negative 2014  . CAD (coronary artery disease)    a. 08/2003 Cath: LAD 30-40-med Rx; b. 11/2014 PCI: LAD 16m (3.25x23 Xience Alpine DES); c. 06/2015 PCI: D1 (2.25x12 Resolute Integrity DES); d. 06/2017 PCI: Patent mLAD stent, D2 95 (PTCA); e. 09/2017 PCI: D2 99ost (CBA); d. 12/2017 Cath: LM nl, LAD 27m, 80d (small), D1 40ost, D2 95ost, LCX 40p, RCA 40ost/p->Med rx for D2 given restenosis.  . CKD (chronic kidney disease), stage III   . Colon polyp   . CVA (cerebral vascular accident) (HCC)    Left side weakness.   . Diabetes (HCC)   . Family history of breast cancer    BRCA neg 2014  . Gastric ulcer 04/27/2011  . History of echocardiogram    a. 03/2017 Echo: EF 60-65%, no rwma; b. 02/2018 Echo: EF 60-65%, no rwma. Nl RV fxn. No cardiac source of emboli (admitted w/ stroke).  Marland Kitchen HTN (hypertension)   . Hyperlipemia   . Hypothyroid   . Malignant melanoma of skin of scalp (HCC)   . MI, acute, non ST segment elevation (HCC)   . Neuromuscular disorder (HCC)   . Orthostatic hypotension   . S/P drug eluting coronary stent placement 06/04/2015  . Sepsis (HCC) 03/18/2017  . Stroke [facility])  a. 02/2018 MRI: 7mm late acute/early subacute L medial frontal lobe inarct; b. 02/2018 MRA No large vessel occlusion or aneurysm. Mod to sev L P2 stenosis. thready L vertebral artery, diffusely dzs'd; c. 02/2018 Carotid U/S: <50% bilat ICA dzs.    Past Surgical History:  Procedure Laterality Date  . APPENDECTOMY    . CARDIAC CATHETERIZATION N/A 11/09/2014   Procedure: Coronary Angiography;  Surgeon: Antonieta Iba, MD;  Location: [facility];  Service: Cardiovascular;  Laterality: N/A;  .  CARDIAC CATHETERIZATION N/A 11/12/2014   Procedure: Coronary Stent Intervention;  Surgeon: Marcina Millard, MD;  Location: [facility];  Service: Cardiovascular;  Laterality: N/A;  . CARDIAC CATHETERIZATION N/A 04/18/2015   Procedure: Left Heart Cath and Coronary Angiography;  Surgeon: Antonieta Iba, MD;  Location: [facility];  Service: Cardiovascular;  Laterality: N/A;  . CARDIAC CATHETERIZATION Left 06/04/2015   Procedure: Left Heart Cath and Coronary Angiography;  Surgeon: Iran Ouch, MD;  Location: [facility];  Service: Cardiovascular;  Laterality: Left;  . CARDIAC CATHETERIZATION N/A 06/04/2015   Procedure: Coronary Stent Intervention;  Surgeon: Iran Ouch, MD;  Location: [facility];  Service: Cardiovascular;  Laterality: N/A;  . CESAREAN SECTION  2001  . CHOLECYSTECTOMY N/A 11/18/2016   Procedure: LAPAROSCOPIC CHOLECYSTECTOMY WITH INTRAOPERATIVE CHOLANGIOGRAM;  Surgeon: Kieth Brightly, MD;  Location: [facility];  Service: General;  Laterality: N/A;  . COLONOSCOPY WITH PROPOFOL N/A 04/27/2016   Procedure: COLONOSCOPY WITH PROPOFOL;  Surgeon: Midge Minium, MD;  Location: [facility];  Service: Endoscopy;  Laterality: N/A;  . COLONOSCOPY WITH PROPOFOL N/A 01/12/2018   Procedure: COLONOSCOPY WITH PROPOFOL;  Surgeon: Toledo, Boykin Nearing, MD;  Location: [facility];  Service: Endoscopy;  Laterality: N/A;  . CORONARY ANGIOPLASTY    . CORONARY BALLOON ANGIOPLASTY N/A 06/29/2017   Procedure: CORONARY BALLOON ANGIOPLASTY;  Surgeon: Iran Ouch, MD;  Location: [facility];  Service: Cardiovascular;  Laterality: N/A;  . CORONARY BALLOON ANGIOPLASTY N/A 09/20/2017   Procedure: CORONARY BALLOON ANGIOPLASTY;  Surgeon: Iran Ouch, MD;  Location: [facility];  Service: Cardiovascular;  Laterality: N/A;  . DILATION AND CURETTAGE OF UTERUS    . ESOPHAGOGASTRODUODENOSCOPY (EGD) WITH PROPOFOL N/A 09/14/2014    Procedure: ESOPHAGOGASTRODUODENOSCOPY (EGD) WITH PROPOFOL;  Surgeon: Elnita Maxwell, MD;  Location: [facility] END[facility];  Service: Endoscopy;  Laterality: N/A;  . ESOPHAGOGASTRODUODENOSCOPY (EGD) WITH PROPOFOL N/A 04/27/2016   Procedure: ESOPHAGOGASTRODUODENOSCOPY (EGD) WITH PROPOFOL;  Surgeon: Midge Minium, MD;  Location: [facility];  Service: Endoscopy;  Laterality: N/A;  Diabetic - oral meds  . ESOPHAGOGASTRODUODENOSCOPY (EGD) WITH PROPOFOL N/A 01/12/2018   Procedure: ESOPHAGOGASTRODUODENOSCOPY (EGD) WITH PROPOFOL;  Surgeon: Toledo, Boykin Nearing, MD;  Location: [facility];  Service: Endoscopy;  Laterality: N/A;  . ESOPHAGOGASTRODUODENOSCOPY (EGD) WITH PROPOFOL N/A 04/11/2019   Procedure: ESOPHAGOGASTRODUODENOSCOPY (EGD) WITH PROPOFOL;  Surgeon: Midge Minium, MD;  Location: [facility];  Service: Endoscopy;  Laterality: N/A;  . GASTRIC BYPASS  09/2009   [facility]   . INTRAMEDULLARY (IM) NAIL INTERTROCHANTERIC Left 10/13/2018   Procedure: INTRAMEDULLARY (IM) NAIL INTERTROCHANTRIC;  Surgeon: Tarry Kos, MD;  Location: [facility];  Service: Orthopedics;  Laterality: Left;  . Left Carotid to sublcavian artery bypass w/ subclavian artery ligation     a. Performed @ [place].  . LEFT HEART CATH AND CORONARY ANGIOGRAPHY Left 06/29/2017   Procedure: LEFT HEART CATH AND CORONARY ANGIOGRAPHY;  Surgeon: Iran Ouch, MD;  Location: [facility];  Service: Cardiovascular;  Laterality: Left;  . LEFT HEART CATH AND CORONARY ANGIOGRAPHY N/A 09/20/2017   Procedure: LEFT HEART CATH AND CORONARY ANGIOGRAPHY;  Surgeon: Iran Ouch, MD;  Location: [facility];  Service: Cardiovascular;  Laterality: N/A;  . LEFT HEART CATH AND CORONARY ANGIOGRAPHY N/A 12/20/2017   Procedure: LEFT HEART CATH AND CORONARY ANGIOGRAPHY;  Surgeon: Iran Ouch, MD;  Location: [facility];  Service: Cardiovascular;  Laterality: N/A;  . MELANOMA EXCISION  2016   Dr. Adolphus Birchwood  .  NOVASURE ABLATION  2002  . RIGHT OOPHORECTOMY    . SHOULDER ARTHROSCOPY WITH OPEN ROTATOR CUFF REPAIR Right 01/07/2016   Procedure: SHOULDER ARTHROSCOPY WITH DEBRIDMENT, SUBACHROMIAL DECOMPRESSION;  Surgeon: Christena Flake, MD;  Location: [facility];  Service: Orthopedics;  Laterality: Right;  . SHOULDER ARTHROSCOPY WITH OPEN ROTATOR CUFF REPAIR Right 03/16/2017   Procedure: SHOULDER ARTHROSCOPY WITH OPEN ROTATOR CUFF REPAIR POSSIBLE BICEPS TENODESIS;  Surgeon: Christena Flake, MD;  Location: [facility];  Service: Orthopedics;  Laterality: Right;  . TRIGGER FINGER RELEASE Right     Middle Finger     Home Medications:  Prior to Admission medications   Medication Sig Start Date Baird Polinski Date Taking? Authorizing Provider  ALPRAZolam (XANAX) 1 MG tablet TAKE 1 TABLET BY MOUTH 1 TO 2 TIMES DAILY FOR ANXIETY Patient taking differently: Take 1 mg by mouth 2 (two) times daily as needed for anxiety. TAKE 1 TABLET BY MOUT 04/04/19  Yes [name], MD  amLODipine (NORVASC) 10 MG tablet Take 5 mg by mouth daily.    Yes [provider]  aspirin 81 MG EC tablet Take 81 mg by mouth daily.    Yes [provider]  buPROPion (WELLBUTRIN XL) 300 MG 24 hr tablet Take 1 tablet (300 mg total) by mouth daily. 03/15/19  Yes [name], MD  clopidogrel (PLAVIX) 75 MG tablet Take 1 tablet (75 mg total) by mouth daily. 07/08/18  Yes Creig Hines, NP  diphenoxylate-atropine (LOMOTIL) 2.5-0.025 MG tablet Take 1-4 tablets by mouth daily. Take one capsule 1-4 times daily for diarrhea 02/28/19  Yes [name], MD  EUTHYROX 25 MCG tablet TAKE 1 TABLET BY MOUTH ONCE DAILY BEFORE BREAKFAST 04/08/19  Yes [name], MD  gabapentin (NEURONTIN) 300 MG capsule Take 1 capsule (300 mg total) by mouth 2 (two) times daily. 10/12/18  Yes [name], MD  Galcanezumab-gnlm (EMGALITY) 120 MG/ML SOAJ INJECT 240 MG SUBCUTANEOUSLY AS DIRECTED FOR THE FIRST MONTH. 11/11/18  Yes [provider]   isosorbide mononitrate (IMDUR) 60 MG 24 hr tablet Take 60 mg by mouth daily.   Yes [provider]  losartan (COZAAR) 25 MG tablet Take 25 mg by mouth daily. 04/05/19  Yes [provider]  metFORMIN (GLUCOPHAGE-XR) 500 MG 24 hr tablet Take 500 mg by mouth daily. 02/15/19  Yes [provider]  metoprolol succinate (TOPROL-XL) 25 MG 24 hr tablet Take 12.5 mg by mouth daily.    Yes [provider]  pantoprazole (PROTONIX) 40 MG tablet Take 1 tablet (40 mg total) by mouth 2 (two) times daily. 03/02/18  Yes [name], MD  traZODone (DESYREL) 100 MG tablet Take 100 mg by mouth at bedtime as needed for sleep.  03/06/19  Yes [provider]  venlafaxine XR (EFFEXOR-XR) 75 MG 24 hr capsule Take 1 capsule (75 mg total) by mouth daily with breakfast. 09/27/18  Yes Mila Merry  E, MD  nitroGLYCERIN (NITROSTAT) 0.4 MG SL tablet Place 1 tablet (0.4 mg total) under the tongue every 5 (five) minutes as needed for chest pain. 06/02/17   Antonieta Iba, MD  promethazine (PHENERGAN) 25 MG tablet Take 1 tablet (25 mg total) by mouth every 6 (six) hours as needed for nausea or vomiting. Patient not taking: Reported on 04/18/2019 11/07/18   [name], MD    Inpatient Medications: Scheduled Meds:  Continuous Infusions:  PRN Meds: hydrALAZINE, morphine injection, nitroGLYCERIN  Allergies:    Allergies  Allergen Reactions  . Lipitor [Atorvastatin] Other (See Comments)    Leg pains  . Tramadol Other (See Comments)    Mouth feels like it's on fire    Social History:   Social History   Tobacco Use  . Smoking status: Former Smoker    Types: Cigarettes    Quit date: 08/31/1994    Years since quitting: 24.6  . Smokeless tobacco: Never Used  . Tobacco comment: quit 28 years ago  Substance Use Topics  . Alcohol use: No    Alcohol/week: 0.0 standard drinks  . Drug use: No     Family History:   Family History  Problem Relation Age of Onset  .  Hypertension Mother   . Anxiety disorder Mother   . Depression Mother   . Bipolar disorder Mother   . Heart disease Mother        No details  . Hyperlipidemia Mother   . Kidney disease Father   . Heart disease Father 40  . Hypertension Father   . Diabetes Father   . Stroke Father   . Colon cancer Father        dx in his 109's  . Anxiety disorder Father   . Depression Father   . Skin cancer Father   . Kidney disease Sister   . Thyroid nodules Sister   . Hypertension Sister   . Hypertension Sister   . Diabetes Sister   . Hyperlipidemia Sister   . Depression Sister   . Breast cancer Maternal Aunt 50  . Breast cancer Maternal Aunt 50  . Ovarian cancer Cousin   . Colon cancer Cousin   . Breast cancer Other   . Kidney cancer Neg Hx   . Bladder Cancer Neg Hx      ROS:  Please see the history of present illness. All other ROS reviewed and negative.     Physical Exam/Data:   Vitals:   04/18/19 0930 04/18/19 0941 04/18/19 1000 04/18/19 1030  BP: (!) 205/115  (!) 184/97 (!) 192/95  Pulse: 65 79 70 66  Resp: 14 17 12 16   Temp:      TempSrc:      SpO2: 99% 100% 100% 98%  Weight:      Height:       No intake or output data in the 24 hours ending 04/18/19 1106 Last 3 Weights 04/18/2019 04/11/2019 04/06/2019  Weight (lbs) 148 lb 148 lb 145 lb 4 oz  Weight (kg) 67.132 kg 67.132 kg 65.885 kg  Some encounter information is confidential and restricted. Go to Review Flowsheets activity to see all data.     Body mass index is 27.07 kg/m.  General:  Well nourished, well developed, in no acute distress HEENT: normal Lymph: no adenopathy Neck: no JVD Endocrine:  No thryomegaly Vascular: 1+ radial and 2+ pedal pulses bilaterally. Cardiac:  normal S1, S2; RRR; no murmurs, rubs, or gallops Lungs:  clear to auscultation  bilaterally, no wheezing, rhonchi or rales  Abd: soft, nontender, no hepatomegaly  Ext: no edema Musculoskeletal:  No deformities, BUE and BLE strength normal and  equal Skin: warm and dry  Neuro:  CNs 2-12 intact, no focal abnormalities noted Psych:  Normal affect   EKG:  The EKG was personally reviewed and demonstrates: Normal sinus rhythm with LVH. Telemetry:  Telemetry was personally reviewed and demonstrates: Sinus rhythm with occasional PVCs  Relevant CV Studies: TTE (01/28/2019): 1. Left ventricular ejection fraction, by visual estimation, is 60 to  65%. The left ventricle has normal function. There is no left ventricular  hypertrophy.  2. Left ventricular diastolic parameters are consistent with Grade I  diastolic dysfunction (impaired relaxation).  3. The left ventricle has no regional wall motion abnormalities.  4. Global right ventricle has normal systolic function.The right  ventricular size is normal. No increase in right ventricular wall  thickness.  5. Left atrial size was normal.  6. TR signal is inadequate for assessing pulmonary artery systolic pressure.  Pharmacologic MPI (02/08/2019): Low risk study without significant ischemia or scar.  LVEF 29%, likely artifactually low due to GI uptake.  Laboratory Data:  High Sensitivity Troponin:   Recent Labs  Lab 04/18/19 0810  TROPONINIHS 8     Chemistry Recent Labs  Lab 04/18/19 0810  NA 142  K 3.6  CL 105  CO2 26  GLUCOSE 74  BUN 14  CREATININE 1.38*  CALCIUM 8.9  GFRNONAA 44*  GFRAA 52*  ANIONGAP 11    Recent Labs  Lab 04/18/19 0810  PROT 7.1  ALBUMIN 3.8  AST 78*  ALT 51*  ALKPHOS 132*  BILITOT 0.7   Hematology Recent Labs  Lab 04/18/19 0810  WBC 8.9  RBC 5.01  HGB 13.5  HCT 40.9  MCV 81.6  MCH 26.9  MCHC 33.0  RDW 17.0*  PLT 182   BNPNo results for input(s): BNP, PROBNP in the last 168 hours.  DDimer No results for input(s): DDIMER in the last 168 hours.   Radiology/Studies:  DG Chest Port 1 View  Result Date: 04/18/2019 CLINICAL DATA:  Chest pain. EXAM: PORTABLE CHEST 1 VIEW COMPARISON:  Chest radiographs 01/27/2019 and earlier  FINDINGS: Unchanged mild cardiomegaly. Redemonstrated right-sided aortic arch. No evidence of airspace consolidation within the lungs. No evidence of pleural effusion or pneumothorax. No acute bony abnormality. IMPRESSION: No evidence of acute cardiopulmonary abnormality. Unchanged mild cardiomegaly. Redemonstrated right-sided aortic arch. Electronically Signed   By: Jackey Loge DO   On: 04/18/2019 08:46   HEART Score (for undifferentiated chest pain):  HEART Score: 6    Assessment and Plan:   Chest pain: Ms. Satira Sark. Jeoffrey [name] presents with acute onset of chest pain that woke her up this morning.  She had not had much chest pain over the last few weeks but did experience headaches and diaphoresis yesterday.  She reports mild improvement in her symptoms with sublingual nitroglycerin.  EKG does not show any acute ischemic changes.  First high-sensitivity troponin I was unremarkable at 8; second troponin is currently pending.  Ms. Satira Sark. Jeoffrey [name] has a history of extensive vascular disease including coronary artery disease requiring multiple interventions.  It is possible that her chest pain reflects unstable angina.  However, in the setting of recent EGD and associated nausea, underlying GI process or complication from EGD should be considered.  Markedly elevated blood pressure is likely contributing to supply-demand mismatch as well.  Follow-up high-sensitivity troponin.  If it does not rise significantly,  recommend deferring catheterization and IV heparin in favor of medical management and further work-up of possible GI cause in the setting of recent EGD.  Continue aspirin and clopidogrel.  Apply nitroglycerin ointment for antianginal therapy and improved blood pressure control.  Aggressive secondary prevention.  Patient is not on a statin due to history of leg pain with atorvastatin.  Given her extensive cardiovascular disease, she needs to be on aggressive lipid-lowering therapy.  PCSK9 inhibitor or  bempedoic acid will need to be considered on an outpatient basis.  Continue pantoprazole.  Hypertensive urgency:  Apply nitroglycerin ointment.  Continue home doses of amlodipine, losartan, and metoprolol succinate.  Further dose adjustment may be needed depending on blood pressure response.  Obtain renal artery Doppler in the setting of labile hypertension, chronic kidney disease, and extensive vascular disease.  Chronic HFpEF: Ms. Satira Sark. Jeoffrey [name] appears euvolemic with stable NYHA class II-III symptoms.  Continue current blood pressure regimen.  No indication for standing diuretic at this time.   Diabetes mellitus:  Per internal medicine.  For questions or updates, please contact [facility] Please consult [email or web address] for contact info under [facility].  Signed, Yvonne Kendall, MD  04/18/2019 11:06 AM

## 2019-04-18 NOTE — ED Provider Notes (Signed)
[facility] Emergency Department Provider Note       Time seen: ----------------------------------------- 7:47 AM on 04/18/2019 -----------------------------------------   I have reviewed the triage vital signs and the nursing notes.  HISTORY Chief Complaint Chest Pain    HPI [name] is a 49 y.o. female with a history of pyelonephritis, anemia, coronary artery disease, CKD, CVA, diabetes, hyperlipidemia, MI, who presents to the ED for central chest pain that started around 5 AM today with diaphoresis.  Patient states she took 2 nitroglycerin and had some relief.  Discomfort is 9 out of 10.  Past Medical History:  Diagnosis Date  . Acute pyelonephritis   . Anemia    iron deficiency anemia  . Aortic arch aneurysm ([place])   . BRCA negative 2014  . CAD (coronary artery disease)    a. 08/2003 Cath: LAD 30-40-med Rx; b. 11/2014 PCI: LAD 32m(3.25x23 Xience Alpine DES); c. 06/2015 PCI: D1 (2.25x12 Resolute Integrity DES); d. 06/2017 PCI: Patent mLAD stent, D2 95 (PTCA); e. 09/2017 PCI: D2 99ost (CBA); d. 12/2017 Cath: LM nl, LAD 325m80d (small), D1 40ost, D2 95ost, LCX 40p, RCA 40ost/p->Med rx for D2 given restenosis.  . CKD (chronic kidney disease), stage III   . Colon polyp   . CVA (cerebral vascular accident) (HCWeissport East   Left side weakness.   . Diabetes (HCMarionville  . Family history of breast cancer    BRCA neg 2014  . Gastric ulcer 04/27/2011  . History of echocardiogram    a. 03/2017 Echo: EF 60-65%, no rwma; b. 02/2018 Echo: EF 60-65%, no rwma. Nl RV fxn. No cardiac source of emboli (admitted w/ stroke).  . [place] (hypertension)   . Hyperlipemia   . Hypothyroid   . Malignant melanoma of skin of scalp (HCAthena  . MI, acute, non ST segment elevation (HCKulpsville  . Neuromuscular disorder (HCEielson AFB  . Orthostatic hypotension   . S/P drug eluting coronary stent placement 06/04/2015  . Sepsis (HCJasper2/14/2019  . Stroke ([facility]   a. 02/2018 MRI: 59m52mate acute/early  subacute L medial frontal lobe inarct; b. 02/2018 MRA No large vessel occlusion or aneurysm. Mod to sev L P2 stenosis. thready L vertebral artery, diffusely dzs'd; c. 02/2018 Carotid U/S: <50% bilat ICA dzs.    Patient Active Problem List   Diagnosis Date Noted  . Esophageal dysphagia   . Gastrointestinal tract imaging abnormality   . Trochanteric bursitis, left hip 03/17/2019  . Malignant neoplasm of vulva, unspecified (HCCAlgonquin2/06/2019  . Peripheral vascular disease, unspecified (HCCFountain Valley2/06/2019  . Paroxysmal supraventricular tachycardia (HCCPagedale2/06/2019  . Contusion of left hip 03/06/2019  . Chest pain 01/27/2019  . Melena   . Hypertensive urgency   . Recurrent chest pain 01/19/2019  . Depression, major, single episode, moderate (HCCWichita1/07/2018  . Fall   . Closed nondisplaced intertrochanteric fracture of left femur (HCCPlanada9/10/2018  . Femur fracture, left (HCCNorthbrook9/10/2018  . Hypotension 06/26/2018  . Autonomic neuropathy 03/24/2018  . Acute delirium 03/03/2018  . H/O gastric bypass 03/02/2018  . Hypertension associated with stage 3 chronic kidney disease due to type 2 diabetes mellitus (HCCGlenwood Springs1/19/2020  . SI (sacroiliac) joint dysfunction 12/02/2017  . MI, acute, non ST segment elevation (HCCBalmville . Effort angina (HCCSouth Taft5/28/2019  . Unstable angina (HCCMaplewood5/23/2019  . Syncope 04/09/2017  . Insomnia 03/18/2017  . Ischemic cardiomyopathy   . Arthritis   . Anxiety   . Tendinitis  of upper biceps tendon of right shoulder 03/16/2017  . Degenerative tear of glenoid labrum of right shoulder 03/15/2017  . Acute colitis 01/27/2017  . Hx of colonic polyps   . H/O medication noncompliance 12/14/2015  . Emesis   . CAD in native artery   . Hypertensive heart disease   . CKD (chronic kidney disease), stage III ([place]) 06/05/2015  . Status post bariatric surgery 06/04/2015  . Colitis 06/03/2015  . Carotid stenosis 04/30/2015  . Type 2 diabetes mellitus with stage 3 chronic kidney disease,  without long-term current use of insulin ([place])   . Stable angina pectoris ([place]) 04/17/2015  . Iron deficiency anemia 03/22/2015  . Vitamin B12 deficiency 02/18/2015  . Misuse of medications for pain 02/18/2015  . Major depressive disorder, recurrent, severe with psychotic features ([place]) 02/15/2015  . Helicobacter pylori infection 11/23/2014  . Hemiparesis, left ([place]) 11/23/2014  . Benign neoplasm of colon 11/20/2014  . Malignant melanoma ([place]) 08/25/2014  . Chronic systolic CHF (congestive heart failure) ([place])   . Incomplete bladder emptying 07/12/2014  . Hypothyroidism 12/30/2013  . Aberrant subclavian artery 11/17/2013  . Multiple sclerosis ([place]) 11/02/2013  . History of CVA with residual deficit 06/20/2013  . Headache, migraine 05/29/2013  . Hyperlipidemia   . GERD (gastroesophageal reflux disease)   . Neuropathy ([place]) 01/02/2011  . Stroke ([place]) 06/21/2008  . Depression with anxiety 05/01/2008  . Essential hypertension 05/01/2008    Past Surgical History:  Procedure Laterality Date  . APPENDECTOMY    . CARDIAC CATHETERIZATION N/A 11/09/2014   Procedure: Coronary Angiography;  Surgeon: Minna Merritts, MD;  Location: [place] CV LAB;  Service: Cardiovascular;  Laterality: N/A;  . CARDIAC CATHETERIZATION N/A 11/12/2014   Procedure: Coronary Stent Intervention;  Surgeon: Isaias Cowman, MD;  Location: [place] CV [facility];  Service: Cardiovascular;  Laterality: N/A;  . CARDIAC CATHETERIZATION N/A 04/18/2015   Procedure: Left Heart Cath and Coronary Angiography;  Surgeon: Minna Merritts, MD;  Location: [place] [facility];  Service: Cardiovascular;  Laterality: N/A;  . CARDIAC CATHETERIZATION Left 06/04/2015   Procedure: Left Heart Cath and Coronary Angiography;  Surgeon: Wellington Hampshire, MD;  Location: [place] CV LAB;  Service: Cardiovascular;  Laterality: Left;  . CARDIAC CATHETERIZATION N/A 06/04/2015   Procedure: Coronary Stent Intervention;  Surgeon: Wellington Hampshire, MD;  Location: [place] [facility] LAB;  Service: Cardiovascular;  Laterality: N/A;  . CESAREAN SECTION  2001  . CHOLECYSTECTOMY N/A 11/18/2016   Procedure: LAPAROSCOPIC CHOLECYSTECTOMY WITH INTRAOPERATIVE CHOLANGIOGRAM;  Surgeon: Christene Lye, MD;  Location: [facility];  Service: General;  Laterality: N/A;  . COLONOSCOPY WITH PROPOFOL N/A 04/27/2016   Procedure: COLONOSCOPY WITH PROPOFOL;  Surgeon: [name], MD;  Location: [place];  Service: Endoscopy;  Laterality: N/A;  . COLONOSCOPY WITH PROPOFOL N/A 01/12/2018   Procedure: COLONOSCOPY WITH PROPOFOL;  Surgeon: [place], Benay Pike, MD;  Location: [facility] ENDOSCOPY;  Service: Endoscopy;  Laterality: N/A;  . CORONARY ANGIOPLASTY    . CORONARY BALLOON ANGIOPLASTY N/A 06/29/2017   Procedure: CORONARY BALLOON ANGIOPLASTY;  Surgeon: Wellington Hampshire, MD;  Location: [place] CV LAB;  Service: Cardiovascular;  Laterality: N/A;  . CORONARY BALLOON ANGIOPLASTY N/A 09/20/2017   Procedure: CORONARY BALLOON ANGIOPLASTY;  Surgeon: Wellington Hampshire, MD;  Location: [place] [facility];  Service: Cardiovascular;  Laterality: N/A;  . DILATION AND CURETTAGE OF UTERUS    . ESOPHAGOGASTRODUODENOSCOPY (EGD) WITH PROPOFOL N/A 09/14/2014   Procedure: ESOPHAGOGASTRODUODENOSCOPY (EGD) WITH PROPOFOL;  Surgeon:  Josefine Class, MD;  Location[facility] ENDOSCOPY;  Service: Endoscopy;  Laterality: N/A;  . ESOPHAGOGASTRODUODENOSCOPY (EGD) WITH PROPOFOL N/A 04/27/2016   Procedure: ESOPHAGOGASTRODUODENOSCOPY (EGD) WITH PROPOFOL;  Surgeon: [name], MD;  Location: [place];  Service: Endoscopy;  Laterality: N/A;  Diabetic - oral meds  . ESOPHAGOGASTRODUODENOSCOPY (EGD) WITH PROPOFOL N/A 01/12/2018   Procedure: ESOPHAGOGASTRODUODENOSCOPY (EGD) WITH PROPOFOL;  Surgeon: [place], Benay Pike, MD;  Location: [facility] ENDOSCOPY;  Service: Endoscopy;  Laterality: N/A;  . ESOPHAGOGASTRODUODENOSCOPY (EGD) WITH PROPOFOL N/A 04/11/2019   Procedure:  ESOPHAGOGASTRODUODENOSCOPY (EGD) WITH PROPOFOL;  Surgeon: [name], MD;  Location: [facility] ENDOSCOPY;  Service: Endoscopy;  Laterality: N/A;  . GASTRIC BYPASS  09/2009   [place] (IM) NAIL INTERTROCHANTERIC Left 10/13/2018   Procedure: INTRAMEDULLARY (IM) NAIL INTERTROCHANTRIC;  Surgeon: Leandrew Koyanagi, MD;  Location: [facility];  Service: Orthopedics;  Laterality: Left;  . Left Carotid to sublcavian artery bypass w/ subclavian artery ligation     a. Performed @ [place].  . LEFT HEART CATH AND CORONARY ANGIOGRAPHY Left 06/29/2017   Procedure: LEFT HEART CATH AND CORONARY ANGIOGRAPHY;  Surgeon: Wellington Hampshire, MD;  Location: [place] [facility] LAB;  Service: Cardiovascular;  Laterality: Left;  . LEFT HEART CATH AND CORONARY ANGIOGRAPHY N/A 09/20/2017   Procedure: LEFT HEART CATH AND CORONARY ANGIOGRAPHY;  Surgeon: Wellington Hampshire, MD;  Location: [place] [facility];  Service: Cardiovascular;  Laterality: N/A;  . LEFT HEART CATH AND CORONARY ANGIOGRAPHY N/A 12/20/2017   Procedure: LEFT HEART CATH AND CORONARY ANGIOGRAPHY;  Surgeon: Wellington Hampshire, MD;  Location: [place] [facility] LAB;  Service: Cardiovascular;  Laterality: N/A;  . MELANOMA EXCISION  2016   Dr. Evorn Gong  . [place]  2002  . RIGHT OOPHORECTOMY    . SHOULDER ARTHROSCOPY WITH OPEN ROTATOR CUFF REPAIR Right 01/07/2016   Procedure: SHOULDER ARTHROSCOPY WITH DEBRIDMENT, SUBACHROMIAL DECOMPRESSION;  Surgeon: Corky Mull, MD;  Location: [facility];  Service: Orthopedics;  Laterality: Right;  . SHOULDER ARTHROSCOPY WITH OPEN ROTATOR CUFF REPAIR Right 03/16/2017   Procedure: SHOULDER ARTHROSCOPY WITH OPEN ROTATOR CUFF REPAIR POSSIBLE BICEPS TENODESIS;  Surgeon: Corky Mull, MD;  Location: [facility];  Service: Orthopedics;  Laterality: Right;  . TRIGGER FINGER RELEASE Right     [place]    Allergies Lipitor [atorvastatin] and Tramadol  Social History Social History   Tobacco Use  .  Smoking status: Former Smoker    Types: Cigarettes    Quit date: 08/31/1994    Years since quitting: 24.6  . Smokeless tobacco: Never Used  . Tobacco comment: quit 28 years ago  Substance Use Topics  . Alcohol use: No    Alcohol/week: 0.0 standard drinks  . Drug use: No    Review of Systems Constitutional: Negative for fever. Cardiovascular: Positive for chest pain Respiratory: Negative for shortness of breath. Gastrointestinal: Negative for abdominal pain, vomiting and diarrhea. Musculoskeletal: Negative for back pain. Skin: Positive for diaphoresis Neurological: Negative for headaches, focal weakness or numbness.  All systems negative/normal/unremarkable except as stated in the HPI  ____________________________________________   PHYSICAL EXAM:  VITAL SIGNS: ED Triage Vitals  Enc Vitals Group     BP 04/18/19 0740 (!) 160/98     Pulse Rate 04/18/19 0740 75     Resp 04/18/19 0740 16     Temp 04/18/19 0740 97.7 F (36.5 C)     Temp Source 04/18/19 0740 Oral     SpO2 04/18/19 0740 100 %  Weight 04/18/19 0738 148 lb (67.1 kg)     Height 04/18/19 0738 '5\' 2"'  (1.575 m)     Head Circumference --      Peak Flow --      Pain Score 04/18/19 0738 9     Pain Loc --      Pain Edu? --      Excl. in [place]? --     Constitutional: Alert and oriented. Well appearing and in no distress. Eyes: Conjunctivae are normal. Normal extraocular movements. ENT      Head: Normocephalic and atraumatic.      Nose: No congestion/rhinnorhea.      Mouth/Throat: Mucous membranes are moist.      Neck: No stridor. Cardiovascular: Normal rate, regular rhythm. No murmurs, rubs, or gallops. Respiratory: Normal respiratory effort without tachypnea nor retractions. Breath sounds are clear and equal bilaterally. No wheezes/rales/rhonchi. Gastrointestinal: Soft and nontender. Normal bowel sounds Musculoskeletal: Nontender with normal range of motion in extremities. No lower extremity tenderness nor  edema. Neurologic:  Normal speech and language. No gross focal neurologic deficits are appreciated.  Skin:  Skin is warm, dry and intact. No rash noted. Psychiatric: Mood and affect are normal. Speech and behavior are normal.  ____________________________________________  EKG: Interpreted by me.  Sinus rhythm with rate of 65 bpm, LVH with repolarization abnormality, normal axis, normal QT  ____________________________________________  ED COURSE:  As part of my medical decision making, I reviewed the following data within the [place] History obtained from family if available, nursing notes, old chart and ekg, as well as notes from prior ED visits. Patient presented for chest pain, we will assess with labs and imaging as indicated at this time.   Procedures  [place] was evaluated in Emergency Department on 04/18/2019 for the symptoms described in the history of present illness. She was evaluated in the context of the global COVID-19 pandemic, which necessitated consideration that the patient might be at risk for infection with the SARS-CoV-2 virus that causes COVID-19. Institutional protocols and algorithms that pertain to the evaluation of patients at risk for COVID-19 are in a state of rapid change based on information released by regulatory bodies including the CDC and federal and state organizations. These policies and algorithms were followed during the patient's care in the ED.  ____________________________________________   LABS (pertinent positives/negatives)  Labs Reviewed  CBC - Abnormal; Notable for the following components:      Result Value   RDW 17.0 (*)    All other components within normal limits  COMPREHENSIVE METABOLIC PANEL - Abnormal; Notable for the following components:   Creatinine, Ser 1.38 (*)    AST 78 (*)    ALT 51 (*)    Alkaline Phosphatase 132 (*)    GFR calc non Af Amer 44 (*)    GFR calc Af Amer 52 (*)    All other  components within normal limits  RESPIRATORY PANEL BY RT PCR (FLU A&B, COVID)  TROPONIN I (HIGH SENSITIVITY)  TROPONIN I (HIGH SENSITIVITY)    RADIOLOGY  Chest x-ray IMPRESSION:  No evidence of acute cardiopulmonary abnormality.   Unchanged mild cardiomegaly. Redemonstrated right-sided aortic arch.  ____________________________________________   DIFFERENTIAL DIAGNOSIS   Unstable angina, MI, PE, dissection, GERD, anxiety, musculoskeletal pain  FINAL ASSESSMENT AND PLAN  Chest pain   Plan: The patient had presented for central chest pain. Patient's labs were grossly unremarkable at this time. Patient's imaging did not reveal any acute abnormality.  She was given nitroglycerin for ongoing chest pain and appears to be stable for further admission and cardiac work-up at this time.   Laurence Aly, MD    Note: This note was generated in part or whole with voice recognition software. Voice recognition is usually quite accurate but there are transcription errors that can and very often do occur. I apologize for any typographical errors that were not detected and corrected.     Earleen Newport, MD 04/18/19 [phone]

## 2019-04-18 NOTE — ED Triage Notes (Signed)
Pt c/o central chest pain that started around 5am today with diaphoresis, states she took 2 nitro and had some relief. Pt is in NAD at  Present, skin is warm and dry.

## 2019-04-18 NOTE — ED Notes (Signed)
Pt toileted and repositioned. Pt resting comfortably, provided with ice chips. Pt verbalized "feeling better". NAD noted. No further needs at this time.

## 2019-04-18 NOTE — H&P (Signed)
History and Physical    [name] AXE:[id number] DOB: 1970-06-21 DOA: 04/18/2019  Referring MD/NP/PA:   PCP: [name], MD   Patient coming from:  The patient is coming from home.  At baseline, pt is independent for most of ADL.        Chief Complaint: chest  pain  HPI: [name] is a 49 y.o. female with medical history significant of hypertension, hyperlipidemia, diabetes mellitus, stroke, GERD, hypothyroidism, depression with 30, CAD, DES placement, gastric ulcer, CKD-3, aortic arch aneurysm, who presents with chest pain.  Patient states that her chest pain started this early morning, which is located central chest, initially 10 out of 10 in severity, currently 5 out of 10 in severity, pressure-like, nonradiating.  No shortness of breath, cough, fever or chills.  Patient has nausea, no vomiting, diarrhea or abdominal pain. She underwent upper endoscopy last week. No symptoms of UTI or unilateral weakness.  Patient has elevated blood pressure 204/92 in ED, stating that she is taking blood pressure medication consistently.  ED Course: pt was found to have trop 8 -->8, negative COVID-19 PCR, stable renal function, WBC 8.9, temperature normal, heart rate 68, oxygen saturation 92% on room air.  Chest x-ray is negative for infiltration.  Patient is placed on telemetry bed for observation.  Cardiology, Dr. Saunders Revel is consulted.  Review of Systems:   General: no fevers, chills, no body weight gain, has fatigue HEENT: no blurry vision, hearing changes or sore throat Respiratory: no dyspnea, coughing, wheezing CV: has chest pain, no palpitations GI: has nausea, no vomiting, abdominal pain, diarrhea, constipation GU: no dysuria, burning on urination, increased urinary frequency, hematuria  Ext: no leg edema Neuro: no unilateral weakness, numbness, or tingling, no vision change or hearing loss Skin: no rash, no skin tear. MSK: No muscle spasm, no deformity, no limitation  of range of movement in spin Heme: No easy bruising.  Travel history: No recent long distant travel.  Allergy:  Allergies  Allergen Reactions  . Lipitor [Atorvastatin] Other (See Comments)    Leg pains  . Tramadol Other (See Comments)    Mouth feels like it's on fire    Past Medical History:  Diagnosis Date  . Acute pyelonephritis   . Anemia    iron deficiency anemia  . Aortic arch aneurysm ([place])   . BRCA negative 2014  . CAD (coronary artery disease)    a. 08/2003 Cath: LAD 30-40-med Rx; b. 11/2014 PCI: LAD 27m(3.25x23 Xience Alpine DES); c. 06/2015 PCI: D1 (2.25x12 Resolute Integrity DES); d. 06/2017 PCI: Patent mLAD stent, D2 95 (PTCA); e. 09/2017 PCI: D2 99ost (CBA); d. 12/2017 Cath: LM nl, LAD 362m80d (small), D1 40ost, D2 95ost, LCX 40p, RCA 40ost/p->Med rx for D2 given restenosis.  . CKD (chronic kidney disease), stage III   . Colon polyp   . CVA (cerebral vascular accident) ([place]   Left side weakness.   . Diabetes ([place]  . Family history of breast cancer    BRCA neg 2014  . Gastric ulcer 04/27/2011  . History of echocardiogram    a. 03/2017 Echo: EF 60-65%, no rwma; b. 02/2018 Echo: EF 60-65%, no rwma. Nl RV fxn. No cardiac source of emboli (admitted w/ stroke).  . [place] (hypertension)   . Hyperlipemia   . Hypothyroid   . Malignant melanoma of skin of scalp ([place]  . MI, acute, non ST segment elevation ([place]  . Neuromuscular disorder ([place]  .  Orthostatic hypotension   . S/P drug eluting coronary stent placement 06/04/2015  . Sepsis ([place]) 03/18/2017  . Stroke [facility])    a. 02/2018 MRI: 65m late acute/early subacute L medial frontal lobe inarct; b. 02/2018 MRA No large vessel occlusion or aneurysm. Mod to sev L P2 stenosis. thready L vertebral artery, diffusely dzs'd; c. 02/2018 Carotid U/S: <50% bilat ICA dzs.    Past Surgical History:  Procedure Laterality Date  . APPENDECTOMY    . CARDIAC CATHETERIZATION N/A 11/09/2014   Procedure: Coronary Angiography;  Surgeon:  TMinna Merritts MD;  [place]: [facility];  Service: Cardiovascular;  Laterality: N/A;  . CARDIAC CATHETERIZATION N/A 11/12/2014   Procedure: Coronary Stent Intervention;  Surgeon: AIsaias Cowman MD;  [place] [facility];  Service: Cardiovascular;  Laterality: N/A;  . CARDIAC CATHETERIZATION N/A 04/18/2015   Procedure: Left Heart Cath and Coronary Angiography;  Surgeon: TMinna Merritts MD;  [place];  Service: Cardiovascular;  Laterality: N/A;  . CARDIAC CATHETERIZATION Left 06/04/2015   Procedure: Left Heart Cath and Coronary Angiography;  Surgeon: MWellington Hampshire MD;  Location: [place];  Service: Cardiovascular;  Laterality: Left;  . CARDIAC CATHETERIZATION N/A 06/04/2015   Procedure: Coronary Stent Intervention;  Surgeon: MWellington Hampshire MD;  [place] [facility];  Service: Cardiovascular;  Laterality: N/A;  . CESAREAN SECTION  2001  . CHOLECYSTECTOMY N/A 11/18/2016   Procedure: LAPAROSCOPIC CHOLECYSTECTOMY WITH INTRAOPERATIVE CHOLANGIOGRAM;  Surgeon: SChristene Lye MD;  [facility];  Service: General;  Laterality: N/A;  . COLONOSCOPY WITH PROPOFOL N/A 04/27/2016   Procedure: COLONOSCOPY WITH PROPOFOL;  Surgeon: DLucilla Lame MD;  [place]  Service: Endoscopy;  Laterality: N/A;  . COLONOSCOPY WITH PROPOFOL N/A 01/12/2018   Procedure: COLONOSCOPY WITH PROPOFOL;  Surgeon: [place], TBenay Pike MD;  Location: [facility];  Service: Endoscopy;  Laterality: N/A;  . CORONARY ANGIOPLASTY    . CORONARY BALLOON ANGIOPLASTY N/A 06/29/2017   Procedure: CORONARY BALLOON ANGIOPLASTY;  Surgeon: AWellington Hampshire MD;  [place];  Service: Cardiovascular;  Laterality: N/A;  . CORONARY BALLOON ANGIOPLASTY N/A 09/20/2017   Procedure: CORONARY BALLOON ANGIOPLASTY;  Surgeon: AWellington Hampshire MD;  [place] [facility];  Service: Cardiovascular;  Laterality: N/A;  .  DILATION AND CURETTAGE OF UTERUS    . ESOPHAGOGASTRODUODENOSCOPY (EGD) WITH PROPOFOL N/A 09/14/2014   Procedure: ESOPHAGOGASTRODUODENOSCOPY (EGD) WITH PROPOFOL;  Surgeon: MJosefine Class MD;  [facility];  Service: Endoscopy;  Laterality: N/A;  . ESOPHAGOGASTRODUODENOSCOPY (EGD) WITH PROPOFOL N/A 04/27/2016   Procedure: ESOPHAGOGASTRODUODENOSCOPY (EGD) WITH PROPOFOL;  Surgeon: DLucilla Lame MD;  [place]  Service: Endoscopy;  Laterality: N/A;  Diabetic - oral meds  . ESOPHAGOGASTRODUODENOSCOPY (EGD) WITH PROPOFOL N/A 01/12/2018   Procedure: ESOPHAGOGASTRODUODENOSCOPY (EGD) WITH PROPOFOL;  Surgeon: [place], TBenay Pike MD;  Location: [facility];  Service: Endoscopy;  Laterality: N/A;  . ESOPHAGOGASTRODUODENOSCOPY (EGD) WITH PROPOFOL N/A 04/11/2019   Procedure: ESOPHAGOGASTRODUODENOSCOPY (EGD) WITH PROPOFOL;  Surgeon: WLucilla Lame MD;  Location: [facility];  Service: Endoscopy;  Laterality: N/A;  . GASTRIC BYPASS  09/2009   [place]) NAIL INTERTROCHANTERIC Left 10/13/2018   Procedure: INTRAMEDULLARY (IM) NAIL INTERTROCHANTRIC;  Surgeon: XLeandrew Koyanagi MD;  [place]  Service: Orthopedics;  Laterality: Left;  . Left Carotid to sublcavian artery bypass w/ subclavian artery ligation     a. [place]  .  LEFT HEART CATH AND CORONARY ANGIOGRAPHY Left 06/29/2017   Procedure: LEFT HEART CATH AND CORONARY ANGIOGRAPHY;  Surgeon: Wellington Hampshire, MD;  Location: [place] CV LAB;  Service: Cardiovascular;  Laterality: Left;  . LEFT HEART CATH AND CORONARY ANGIOGRAPHY N/A 09/20/2017   Procedure: LEFT HEART CATH AND CORONARY ANGIOGRAPHY;  Surgeon: Wellington Hampshire, MD;  Location: [place] CV LAB;  Service: Cardiovascular;  Laterality: N/A;  . LEFT HEART CATH AND CORONARY ANGIOGRAPHY N/A 12/20/2017   Procedure: LEFT HEART CATH AND CORONARY ANGIOGRAPHY;  Surgeon: Wellington Hampshire, MD;  Location: [place] [facility] LAB;  Service: Cardiovascular;  Laterality: N/A;  . MELANOMA EXCISION  2016   Dr. Evorn Gong  . [place]  2002  . RIGHT OOPHORECTOMY    . SHOULDER ARTHROSCOPY WITH OPEN ROTATOR CUFF REPAIR Right 01/07/2016   Procedure: SHOULDER ARTHROSCOPY WITH DEBRIDMENT, SUBACHROMIAL DECOMPRESSION;  Surgeon: Corky Mull, MD;  Location: [facility];  Service: Orthopedics;  Laterality: Right;  . SHOULDER ARTHROSCOPY WITH OPEN ROTATOR CUFF REPAIR Right 03/16/2017   Procedure: SHOULDER ARTHROSCOPY WITH OPEN ROTATOR CUFF REPAIR POSSIBLE BICEPS TENODESIS;  Surgeon: Corky Mull, MD;  Location: [facility];  Service: Orthopedics;  Laterality: Right;  . TRIGGER FINGER RELEASE Right     [place]    Social History:  reports that she quit smoking about 24 years ago. Her smoking use included cigarettes. She has never used smokeless tobacco. She reports that she does not drink alcohol or use drugs.  Family History:  Family History  Problem Relation Age of Onset  . Hypertension Mother   . Anxiety disorder Mother   . Depression Mother   . Bipolar disorder Mother   . Heart disease Mother        No details  . Hyperlipidemia Mother   . Kidney disease Father   . Heart disease Father 26  . Hypertension Father   . Diabetes Father   . Stroke Father   . Colon cancer Father        dx in his 109's  . Anxiety disorder Father   . Depression Father   . Skin cancer Father   . Kidney disease Sister   . Thyroid nodules Sister   . Hypertension Sister   . Hypertension Sister   . Diabetes Sister   . Hyperlipidemia Sister   . Depression Sister   . Breast cancer Maternal Aunt 29  . Breast cancer Maternal Aunt 52  . Ovarian cancer Cousin   . Colon cancer Cousin   . Breast cancer Other   . Kidney cancer Neg Hx   . Bladder Cancer Neg Hx      Prior to Admission medications   Medication Sig Start Date End Date Taking? Authorizing Provider  ALPRAZolam Duanne Moron) 1 MG tablet TAKE 1 TABLET BY MOUTH 1 TO 2  TIMES DAILY FOR ANXIETY 04/04/19  Yes [name], MD  amLODipine (NORVASC) 10 MG tablet Take 5 mg by mouth daily.    Yes [provider]  aspirin 81 MG EC tablet Take 81 mg by mouth daily.    Yes [provider]  buPROPion (WELLBUTRIN XL) 300 MG 24 hr tablet Take 1 tablet (300 mg total) by mouth daily. 03/15/19  Yes [name], MD  clopidogrel (PLAVIX) 75 MG tablet Take 1 tablet (75 mg total) by mouth daily. 07/08/18  Yes Theora Gianotti, NP  diphenoxylate-atropine (LOMOTIL) 2.5-0.025 MG tablet Take 1-4 tablets by mouth daily. Take one capsule 1-4 times  daily for diarrhea 02/28/19  Yes [name], MD  EUTHYROX 25 MCG tablet TAKE 1 TABLET BY MOUTH ONCE DAILY BEFORE BREAKFAST 04/08/19  Yes [name], MD  gabapentin (NEURONTIN) 300 MG capsule Take 1 capsule (300 mg total) by mouth 2 (two) times daily. 10/12/18  Yes [name], MD  Galcanezumab-gnlm (EMGALITY) 120 MG/ML SOAJ INJECT 240 MG SUBCUTANEOUSLY AS DIRECTED FOR THE FIRST MONTH. 11/11/18  Yes [provider]  losartan (COZAAR) 25 MG tablet Take 25 mg by mouth daily. 04/05/19  Yes [provider]  metoprolol succinate (TOPROL-XL) 25 MG 24 hr tablet Take 12.5 mg by mouth daily.    Yes [provider]  pantoprazole (PROTONIX) 40 MG tablet Take 1 tablet (40 mg total) by mouth 2 (two) times daily. 03/02/18  Yes [name], MD  traZODone (DESYREL) 100 MG tablet Take 100 mg by mouth at bedtime. 03/06/19  Yes [provider]  venlafaxine XR (EFFEXOR-XR) 75 MG 24 hr capsule Take 1 capsule (75 mg total) by mouth daily with breakfast. 09/27/18  Yes [name], MD  metFORMIN (GLUCOPHAGE-XR) 500 MG 24 hr tablet Take 500 mg by mouth daily. 02/15/19   [provider]  nitroGLYCERIN (NITROSTAT) 0.4 MG SL tablet Place 1 tablet (0.4 mg total) under the tongue every 5 (five) minutes as needed for chest pain. 06/02/17   Minna Merritts, MD  promethazine (PHENERGAN) 25 MG  tablet Take 1 tablet (25 mg total) by mouth every 6 (six) hours as needed for nausea or vomiting. Patient not taking: Reported on 04/18/2019 11/07/18   [name], MD    Physical Exam: Vitals:   04/18/19 1600 04/18/19 1630 04/18/19 1645 04/18/19 1700  BP: (!) 172/101 (!) 168/103  (!) 138/118  Pulse: 79 73 76 (!) 120  Resp: '10 13 13 ' (!) 21  Temp:      TempSrc:      SpO2: 98% 99% 99% 98%  Weight:      Height:       General: Not in acute distress HEENT:       Eyes: PERRL, EOMI, no scleral icterus.       ENT: No discharge from the ears and nose, no pharynx injection, no tonsillar enlargement.        Neck: No JVD, no bruit, no mass felt. Heme: No neck lymph node enlargement. Cardiac: S1/S2, RRR, No murmurs, No gallops or rubs. Respiratory: No rales, wheezing, rhonchi or rubs. GI: Soft, nondistended, nontender, no rebound pain, no organomegaly, BS present. GU: No hematuria Ext: No pitting leg edema bilaterally. 2+DP/PT pulse bilaterally. Musculoskeletal: No joint deformities, No joint redness or warmth, no limitation of ROM in spin. Skin: No rashes.  Neuro: Alert, oriented X3, cranial nerves II-XII grossly intact, moves all extremities normally.  Psych: Patient is not psychotic, no suicidal or hemocidal ideation.  Labs on Admission: I have personally reviewed following labs and imaging studies  CBC: Recent Labs  Lab 04/18/19 0810  WBC 8.9  HGB 13.5  HCT 40.9  MCV 81.6  PLT 476   Basic Metabolic Panel: Recent Labs  Lab 04/18/19 0810  NA 142  K 3.6  CL 105  CO2 26  GLUCOSE 74  BUN 14  CREATININE 1.38*  CALCIUM 8.9   GFR: Estimated Creatinine Clearance: 43.8 mL/min (A) (by C-G formula based on SCr of 1.38 mg/dL (H)). Liver Function Tests: Recent Labs  Lab 04/18/19 0810  AST 78*  ALT 51*  ALKPHOS 132*  BILITOT 0.7  PROT 7.1  ALBUMIN 3.8   No results for input(s): LIPASE, AMYLASE in the last 168 hours. No results for input(s): AMMONIA in the last 168  hours. Coagulation Profile: No results for input(s): INR, PROTIME in the last 168 hours. Cardiac Enzymes: No results for input(s): CKTOTAL, CKMB, CKMBINDEX, TROPONINI in the last 168 hours. BNP (last 3 results) No results for input(s): PROBNP in the last 8760 hours. HbA1C: No results for input(s): HGBA1C in the last 72 hours. CBG: Recent Labs  Lab 04/18/19 1602  GLUCAP 134*   Lipid Profile: No results for input(s): CHOL, HDL, LDLCALC, TRIG, CHOLHDL, LDLDIRECT in the last 72 hours. Thyroid Function Tests: No results for input(s): TSH, T4TOTAL, FREET4, T3FREE, THYROIDAB in the last 72 hours. Anemia Panel: No results for input(s): VITAMINB12, FOLATE, FERRITIN, TIBC, IRON, RETICCTPCT in the last 72 hours. Urine analysis:    Component Value Date/Time   COLORURINE YELLOW (A) 02/19/2019 0629   APPEARANCEUR HAZY (A) 02/19/2019 0629   APPEARANCEUR Cloudy (A) 07/17/2014 1455   LABSPEC 1.008 02/19/2019 0629   LABSPEC 1.012 12/29/2013 1210   PHURINE 6.0 02/19/2019 0629   GLUCOSEU NEGATIVE 02/19/2019 0629   GLUCOSEU >=500 12/29/2013 1210   HGBUR NEGATIVE 02/19/2019 0629   HGBUR negative 04/14/2010 1435   BILIRUBINUR negative 03/10/2019 1010   BILIRUBINUR Negative 07/17/2014 1455   BILIRUBINUR Negative 12/29/2013 1210   KETONESUR NEGATIVE 02/19/2019 0629   PROTEINUR Negative 03/10/2019 1010   PROTEINUR NEGATIVE 02/19/2019 0629   UROBILINOGEN 0.2 03/10/2019 1010   UROBILINOGEN negative 04/14/2010 1435   NITRITE negative 03/10/2019 1010   NITRITE NEGATIVE 02/19/2019 0629   LEUKOCYTESUR Moderate (2+) (A) 03/10/2019 1010   LEUKOCYTESUR LARGE (A) 02/19/2019 0629   LEUKOCYTESUR Negative 12/29/2013 1210   Sepsis Labs: '@LABRCNTIP' (procalcitonin:4,lacticidven:4) ) Recent Results (from the past 240 hour(s))  Respiratory Panel by RT PCR (Flu A&B, Covid) - Nasopharyngeal Swab     Status: None   Collection Time: 04/18/19  9:14 AM   Specimen: Nasopharyngeal Swab  Result Value Ref Range  Status   SARS Coronavirus 2 by RT PCR NEGATIVE NEGATIVE Final    Comment: (NOTE) SARS-CoV-2 target nucleic acids are NOT DETECTED. The SARS-CoV-2 RNA is generally detectable in upper respiratoy specimens during the acute phase of infection. The lowest concentration of SARS-CoV-2 viral copies this assay can detect is 131 copies/mL. A negative result does not preclude SARS-Cov-2 infection and should not be used as the sole basis for treatment or other patient management decisions. A negative result may occur with  improper specimen collection/handling, submission of specimen other than nasopharyngeal swab, presence of viral mutation(s) within the areas targeted by this assay, and inadequate number of viral copies (<131 copies/mL). A negative result must be combined with clinical observations, patient history, and epidemiological information. The expected result is Negative. Fact Sheet for Patients:  PinkCheek.be Fact Sheet for Healthcare Providers:  GravelBags.it This test is not yet ap proved or cleared by the [place] FDA and  has been authorized for detection and/or diagnosis of SARS-CoV-2 by FDA under an Emergency Use Authorization (EUA). This EUA will remain  in effect (meaning this test can be used) for the duration of the COVID-19 declaration under Section 564(b)(1) of the Act, 21 U.S.C. section 360bbb-3(b)(1), unless the authorization is terminated or revoked sooner.    Influenza A by PCR NEGATIVE NEGATIVE Final   Influenza B by PCR NEGATIVE NEGATIVE Final    Comment: (NOTE) The Xpert Xpress SARS-CoV-2/FLU/RSV assay is intended as an  aid in  the diagnosis of influenza from Nasopharyngeal swab specimens and  should not be used as a sole basis for treatment. Nasal washings and  aspirates are unacceptable for Xpert Xpress SARS-CoV-2/FLU/RSV  testing. Fact Sheet for  Patients: PinkCheek.be Fact Sheet for Healthcare Providers: GravelBags.it This test is not yet approved or cleared by the [place] FDA and  has been authorized for detection and/or diagnosis of SARS-CoV-2 by  FDA under an Emergency Use Authorization (EUA). This EUA will remain  in effect (meaning this test can be used) for the duration of the  Covid-19 declaration under Section 564(b)(1) of the Act, 21  U.S.C. section 360bbb-3(b)(1), unless the authorization is  terminated or revoked. Performed at [facility], [address], [place], [place] [address]      Radiological Exams on Admission: DG Chest [facility] 1 View  Result Date: 04/18/2019 CLINICAL DATA:  Chest pain. EXAM: PORTABLE CHEST 1 VIEW COMPARISON:  Chest radiographs 01/27/2019 and earlier FINDINGS: Unchanged mild cardiomegaly. Redemonstrated right-sided aortic arch. No evidence of airspace consolidation within the lungs. No evidence of pleural effusion or pneumothorax. No acute bony abnormality. IMPRESSION: No evidence of acute cardiopulmonary abnormality. Unchanged mild cardiomegaly. Redemonstrated right-sided aortic arch. Electronically Signed   By: Kellie Simmering DO   On: 04/18/2019 08:46     EKG: Independently reviewed.  Sinus rhythm, QTC 407, nonspecific T wave change    Assessment/Plan Principal Problem:   Hypertensive urgency Active Problems:   Depression with anxiety   Stroke (HCC)   GERD (gastroesophageal reflux disease)   Hypothyroidism   Type 2 diabetes mellitus with stage 3 chronic kidney disease, without long-term current use of insulin (HCC)   CKD (chronic kidney disease), stage III b   CAD in native artery   Chest pain   Abnormal LFTs   Hypertensive urgency: Bp is up to 204/92. Dr. Saunders Revel of card is consulted.  -place on tele bed for obs -IV as needed hydralazine -Amlodipine, Cozaar, metoprolol - renal artery Doppler is ordered by Dr.  Saunders Revel  Chest pain and hx of CAD: s/p of DES. trop 8 -->8.  EKG has no acute change.  Likely due to demand ischemia.  Due to high risk history, cardiology was consulted, recommended medical management - Repeat EKG in the am  - prn Nitroglycerin, Morphine, and aspirin, plavix - pt is allergic to statin --> may consider PCSK9 inhibitor or bempedoic acid as outpt per Dr. Saunders Revel - Risk factor stratification: will check FLP and A1C  - check UDS  Depression and anxiety: Stable, no suicidal or homicidal ideations. -Continue home medications   Stroke [facility]): -ASA, plavix  GERD (gastroesophageal reflux disease) -protonix  Hypothyroidism:  -Synthroid  Type 2 diabetes mellitus with stage 3 chronic kidney disease, without long-term current use of insulin ([place]): Most recent A1c 5.6, ell controled Patient is taking Metformin at home -SSI  CKD (chronic kidney disease), stage III b: stable.  Baseline creatinine 1.5-2.0.  Her creatinine is 1.38, BUN 14 -Follow-up.  BMP  Abnormal LFTs: -check hepatitis panel -HIV antibody -Avoid using Tylenol        DVT ppx: SQ Heparin Code Status: Full code Family Communication: not done, no family member is at bed side.   Disposition Plan:  Anticipate discharge back to previous home environment Consults called:  Dr. Saunders Revel Admission status: Tele bed for obs     Date of Service 04/18/2019    [facility]   If 7PM-7AM, please contact night-coverage [email or web address] 04/18/2019,  5:22 PM

## 2019-04-18 NOTE — ED Notes (Signed)
Attempted to call report x 1  

## 2019-04-18 NOTE — ED Notes (Signed)
Pt given PRN BP meds d/t SBP >190. Pt c/o headache and overall "not feeling good at all".  Pt status update given to admitting MD.

## 2019-04-18 NOTE — ED Notes (Signed)
Pt given meal tray.

## 2019-04-18 NOTE — Telephone Encounter (Signed)
Patient went to ER for care. I replied to her via mychart and let her know that if she would like to schedule a follow up visit or has any further questions or concerns to please let us know.

## 2019-04-18 NOTE — ED Notes (Signed)
Pt called out d/t nausea. Pt appeared uncomfortable and diaphoretic. EDP notified, ordered medications for pt.

## 2019-04-18 NOTE — ED Notes (Signed)
[place] requested pt to be NPO after midnight starting tonight for the [place] ordered by admitting MD. Will inform admitting MD.

## 2019-04-19 ENCOUNTER — Encounter: Payer: Self-pay | Admitting: Internal Medicine

## 2019-04-19 ENCOUNTER — Observation Stay: Payer: Medicare Other

## 2019-04-19 DIAGNOSIS — Z20822 Contact with and (suspected) exposure to covid-19: Secondary | ICD-10-CM | POA: Diagnosis present

## 2019-04-19 DIAGNOSIS — I252 Old myocardial infarction: Secondary | ICD-10-CM | POA: Diagnosis not present

## 2019-04-19 DIAGNOSIS — E039 Hypothyroidism, unspecified: Secondary | ICD-10-CM | POA: Diagnosis present

## 2019-04-19 DIAGNOSIS — N179 Acute kidney failure, unspecified: Secondary | ICD-10-CM | POA: Diagnosis not present

## 2019-04-19 DIAGNOSIS — R945 Abnormal results of liver function studies: Secondary | ICD-10-CM | POA: Diagnosis not present

## 2019-04-19 DIAGNOSIS — E785 Hyperlipidemia, unspecified: Secondary | ICD-10-CM | POA: Diagnosis present

## 2019-04-19 DIAGNOSIS — Z833 Family history of diabetes mellitus: Secondary | ICD-10-CM | POA: Diagnosis not present

## 2019-04-19 DIAGNOSIS — E440 Moderate protein-calorie malnutrition: Secondary | ICD-10-CM | POA: Insufficient documentation

## 2019-04-19 DIAGNOSIS — E860 Dehydration: Secondary | ICD-10-CM | POA: Diagnosis present

## 2019-04-19 DIAGNOSIS — K219 Gastro-esophageal reflux disease without esophagitis: Secondary | ICD-10-CM | POA: Diagnosis present

## 2019-04-19 DIAGNOSIS — Z823 Family history of stroke: Secondary | ICD-10-CM | POA: Diagnosis not present

## 2019-04-19 DIAGNOSIS — R0989 Other specified symptoms and signs involving the circulatory and respiratory systems: Secondary | ICD-10-CM | POA: Diagnosis not present

## 2019-04-19 DIAGNOSIS — Z8249 Family history of ischemic heart disease and other diseases of the circulatory system: Secondary | ICD-10-CM | POA: Diagnosis not present

## 2019-04-19 DIAGNOSIS — Z87891 Personal history of nicotine dependence: Secondary | ICD-10-CM | POA: Diagnosis not present

## 2019-04-19 DIAGNOSIS — I248 Other forms of acute ischemic heart disease: Secondary | ICD-10-CM | POA: Diagnosis present

## 2019-04-19 DIAGNOSIS — Z800 Family history of malignant neoplasm of digestive organs: Secondary | ICD-10-CM | POA: Diagnosis not present

## 2019-04-19 DIAGNOSIS — Z841 Family history of disorders of kidney and ureter: Secondary | ICD-10-CM | POA: Diagnosis not present

## 2019-04-19 DIAGNOSIS — I160 Hypertensive urgency: Secondary | ICD-10-CM | POA: Diagnosis present

## 2019-04-19 DIAGNOSIS — N184 Chronic kidney disease, stage 4 (severe): Secondary | ICD-10-CM | POA: Diagnosis present

## 2019-04-19 DIAGNOSIS — I129 Hypertensive chronic kidney disease with stage 1 through stage 4 chronic kidney disease, or unspecified chronic kidney disease: Secondary | ICD-10-CM | POA: Diagnosis not present

## 2019-04-19 DIAGNOSIS — I5032 Chronic diastolic (congestive) heart failure: Secondary | ICD-10-CM | POA: Diagnosis present

## 2019-04-19 DIAGNOSIS — N183 Chronic kidney disease, stage 3 unspecified: Secondary | ICD-10-CM | POA: Diagnosis not present

## 2019-04-19 DIAGNOSIS — I2510 Atherosclerotic heart disease of native coronary artery without angina pectoris: Secondary | ICD-10-CM | POA: Diagnosis not present

## 2019-04-19 DIAGNOSIS — F418 Other specified anxiety disorders: Secondary | ICD-10-CM | POA: Diagnosis present

## 2019-04-19 DIAGNOSIS — Z818 Family history of other mental and behavioral disorders: Secondary | ICD-10-CM | POA: Diagnosis not present

## 2019-04-19 DIAGNOSIS — Z8349 Family history of other endocrine, nutritional and metabolic diseases: Secondary | ICD-10-CM | POA: Diagnosis not present

## 2019-04-19 DIAGNOSIS — R030 Elevated blood-pressure reading, without diagnosis of hypertension: Secondary | ICD-10-CM | POA: Diagnosis not present

## 2019-04-19 DIAGNOSIS — I200 Unstable angina: Secondary | ICD-10-CM | POA: Diagnosis not present

## 2019-04-19 DIAGNOSIS — I130 Hypertensive heart and chronic kidney disease with heart failure and stage 1 through stage 4 chronic kidney disease, or unspecified chronic kidney disease: Secondary | ICD-10-CM | POA: Diagnosis present

## 2019-04-19 DIAGNOSIS — I25110 Atherosclerotic heart disease of native coronary artery with unstable angina pectoris: Secondary | ICD-10-CM | POA: Diagnosis present

## 2019-04-19 DIAGNOSIS — E1122 Type 2 diabetes mellitus with diabetic chronic kidney disease: Secondary | ICD-10-CM | POA: Diagnosis present

## 2019-04-19 DIAGNOSIS — R079 Chest pain, unspecified: Secondary | ICD-10-CM | POA: Diagnosis not present

## 2019-04-19 DIAGNOSIS — I639 Cerebral infarction, unspecified: Secondary | ICD-10-CM | POA: Diagnosis present

## 2019-04-19 LAB — URINALYSIS, ROUTINE W REFLEX MICROSCOPIC
Bacteria, UA: NONE SEEN
Bilirubin Urine: NEGATIVE
Glucose, UA: NEGATIVE mg/dL
Hgb urine dipstick: NEGATIVE
Ketones, ur: NEGATIVE mg/dL
Nitrite: NEGATIVE
Protein, ur: 30 mg/dL — AB
Specific Gravity, Urine: 1.011 (ref 1.005–1.030)
WBC, UA: >50 WBC/hpf — ABNORMAL HIGH (ref 0–5)
pH: 7 (ref 5.0–8.0)

## 2019-04-19 LAB — HEMOGLOBIN A1C
Hgb A1c MFr Bld: 5.1 % (ref 4.8–5.6)
Mean Plasma Glucose: 99.67 mg/dL

## 2019-04-19 LAB — LIPID PANEL
Cholesterol: 125 mg/dL (ref 0–200)
HDL: 34 mg/dL — ABNORMAL LOW (ref >40)
LDL Cholesterol: 76 mg/dL (ref 0–99)
Total CHOL/HDL Ratio: 3.7 RATIO
Triglycerides: 75 mg/dL (ref <150)
VLDL: 15 mg/dL (ref 0–40)

## 2019-04-19 LAB — GLUCOSE, CAPILLARY
Glucose-Capillary: 65 mg/dL — ABNORMAL LOW (ref 70–99)
Glucose-Capillary: 91 mg/dL (ref 70–99)
Glucose-Capillary: 109 mg/dL — ABNORMAL HIGH (ref 70–99)
Glucose-Capillary: 112 mg/dL — ABNORMAL HIGH (ref 70–99)
Glucose-Capillary: 103 mg/dL — ABNORMAL HIGH (ref 70–99)

## 2019-04-19 LAB — HIV ANTIBODY (ROUTINE TESTING W REFLEX): HIV Screen 4th Generation wRfx: NONREACTIVE

## 2019-04-19 MED ORDER — ISOSORBIDE MONONITRATE ER 60 MG PO TB24: 90.0000 mg | ORAL_TABLET | Freq: Every day | ORAL | Status: DC

## 2019-04-19 MED ORDER — ISOSORBIDE MONONITRATE ER 30 MG PO TB24
30.0000 mg | ORAL_TABLET | Freq: Once | ORAL | Status: AC
  Administered 2019-04-19: 30 mg via ORAL
  Filled 2019-04-19: qty 1

## 2019-04-19 MED ORDER — ADULT MULTIVITAMIN W/MINERALS CH
1.0000 | ORAL_TABLET | Freq: Every day | ORAL | Status: DC
  Administered 2019-04-20 – 2019-04-21 (×2): 1 via ORAL
  Filled 2019-04-19 (×2): qty 1

## 2019-04-19 MED ORDER — ENOXAPARIN SODIUM 40 MG/0.4ML ~~LOC~~ SOLN
40.0000 mg | SUBCUTANEOUS | Status: DC
  Administered 2019-04-19: 40 mg via SUBCUTANEOUS
  Filled 2019-04-19: qty 0.4

## 2019-04-19 MED ORDER — ENSURE ENLIVE PO LIQD
237.0000 mL | Freq: Two times a day (BID) | ORAL | Status: DC
  Administered 2019-04-19: 237 mL via ORAL

## 2019-04-19 NOTE — Progress Notes (Addendum)
Progress Note    [name]  LKG:[id number] DOB: 1970/03/12  DOA: 04/18/2019 PCP: [name], MD      Brief Narrative:    Medical records reviewed and are as summarized below:  [name] [name] is an 49 y.o. female with medical history significant of hypertension, hyperlipidemia, diabetes mellitus, stroke, GERD, hypothyroidism, depression with 60, CAD, DES placement, gastric ulcer, CKD-3, aortic arch aneurysm, who presents with chest pain.  Patient states that her chest pain started this early morning, which is located central chest, initially 10 out of 10 in severity, currently 5 out of 10 in severity, pressure-like, nonradiating.  No shortness of breath, cough, fever or chills.  Patient has nausea, no vomiting, diarrhea or abdominal pain. She underwent upper endoscopy last week. Patient has elevated blood pressure 204/92 in ED, stating that she is taking blood pressure medication consistently      Assessment/Plan:   Principal Problem:   Hypertensive urgency Active Problems:   Depression with anxiety   Stroke (HCC)   GERD (gastroesophageal reflux disease)   Hypothyroidism   Type 2 diabetes mellitus with stage 3 chronic kidney disease, without long-term current use of insulin (HCC)   CKD (chronic kidney disease), stage III b   CAD in native artery   Chest pain   Abnormal LFTs   Malnutrition of moderate degree  Chest pain in a patient with underlying CAD status post drug-eluting stent: Troponins negative.  Continue aspirin and Plavix.  Possible left heart cath tomorrow.  Plan discussed with cardiologist, Dr. Azucena Cecil  Hypertensive urgency: BP has improved.  Continue antihypertensives.  Renal duplex did not show any evidence of hemodynamically significant stenosis.  Depression and anxiety: Stable, no suicidal or homicidal ideations. -Continue home medications   Stroke [facility]): -ASA, plavix  GERD (gastroesophageal reflux  disease) -protonix  Hypothyroidism:  -Synthroid  Type 2 diabetes mellitus with stage 3 chronic kidney disease, without long-term current use of insulin (HCC): Most recent A1c 5.1, well controled Patient is taking Metformin at home -SSI  CKD (chronic kidney disease), stage IV: stable.  Baseline creatinine 1.5-2.3.  Her creatinine is 1.38, BUN 14 -Follow-up  BMP  Moderate protein calorie malnutrition: Encouraged adequate oral intake and use of nutritional supplements.  Follow-up with dietitian.  Mildly elevated liver enzymes: Repeat CMP tomorrow Acute hepatitis panel and HIV antibody nonreactive   Body mass index is 25.95 kg/m.   Family Communication/Anticipated D/C date and plan/Code Status   DVT prophylaxis: Lovenox Code Status: Full code Family Communication: Plan discussed with patient Disposition Plan: Patient is from home.  Plan to discharge home when cleared by cardiologist.      Subjective:   C/o chest pain.  No shortness of breath or dizziness.  Objective:    Vitals:   04/18/19 2147 04/19/19 0426 04/19/19 0920 04/19/19 1215  BP:  114/71 (!) 117/99 131/71  Pulse:  (!) 52 64 65  Resp:  15 19 19   Temp:  97.6 F (36.4 C) (!) 97.5 F (36.4 C) 98.9 F (37.2 C)  TempSrc:  Oral Oral Oral  SpO2:  97% 97% 98%  Weight: 64 kg 64.4 kg    Height: 5\' 2"  (1.575 m)       Intake/Output Summary (Last 24 hours) at 04/19/2019 1609 Last data filed at 04/19/2019 1539 Gross per 24 hour  Intake 240 ml  Output 500 ml  Net -260 ml   Filed Weights   04/18/19 0738 04/18/19 2147 04/19/19 0426  Weight:  67.1 kg 64 kg 64.4 kg    Exam:  GEN: NAD SKIN: No rash EYES: EOMI ENT: MMM CV: RRR PULM: CTA B ABD: soft, ND, NT, +BS CNS: AAO x 3, non focal EXT: No edema or tenderness   Data Reviewed:   I have personally reviewed following labs and imaging studies:  Labs: Labs show the following:   Basic Metabolic Panel: Recent Labs  Lab 04/18/19 0810  NA 142  K  3.6  CL 105  CO2 26  GLUCOSE 74  BUN 14  CREATININE 1.38*  CALCIUM 8.9   GFR Estimated Creatinine Clearance: 43 mL/min (A) (by C-G formula based on SCr of 1.38 mg/dL (H)). Liver Function Tests: Recent Labs  Lab 04/18/19 0810  AST 78*  ALT 51*  ALKPHOS 132*  BILITOT 0.7  PROT 7.1  ALBUMIN 3.8   No results for input(s): LIPASE, AMYLASE in the last 168 hours. No results for input(s): AMMONIA in the last 168 hours. Coagulation profile No results for input(s): INR, PROTIME in the last 168 hours.  CBC: Recent Labs  Lab 04/18/19 0810  WBC 8.9  HGB 13.5  HCT 40.9  MCV 81.6  PLT 182   Cardiac Enzymes: No results for input(s): CKTOTAL, CKMB, CKMBINDEX, TROPONINI in the last 168 hours. BNP (last 3 results) No results for input(s): PROBNP in the last 8760 hours. CBG: Recent Labs  Lab 04/18/19 1602 04/18/19 2157 04/19/19 0916 04/19/19 0953 04/19/19 1216  GLUCAP 134* 91 65* 91 109*   D-Dimer: No results for input(s): DDIMER in the last 72 hours. Hgb A1c: Recent Labs    04/19/19 0408  HGBA1C 5.1   Lipid Profile: Recent Labs    04/19/19 0408  CHOL 125  HDL 34*  LDLCALC 76  TRIG 75  CHOLHDL 3.7   Thyroid function studies: No results for input(s): TSH, T4TOTAL, T3FREE, THYROIDAB in the last 72 hours.  Invalid input(s): FREET3 Anemia work up: No results for input(s): VITAMINB12, FOLATE, FERRITIN, TIBC, IRON, RETICCTPCT in the last 72 hours. Sepsis Labs: Recent Labs  Lab 04/18/19 0810  WBC 8.9    Microbiology Recent Results (from the past 240 hour(s))  Respiratory Panel by RT PCR (Flu A&B, Covid) - Nasopharyngeal Swab     Status: None   Collection Time: 04/18/19  9:14 AM   Specimen: Nasopharyngeal Swab  Result Value Ref Range Status   SARS Coronavirus 2 by RT PCR NEGATIVE NEGATIVE Final    Comment: (NOTE) SARS-CoV-2 target nucleic acids are NOT DETECTED. The SARS-CoV-2 RNA is generally detectable in upper respiratoy specimens during the acute  phase of infection. The lowest concentration of SARS-CoV-2 viral copies this assay can detect is 131 copies/mL. A negative result does not preclude SARS-Cov-2 infection and should not be used as the sole basis for treatment or other patient management decisions. A negative result may occur with  improper specimen collection/handling, submission of specimen other than nasopharyngeal swab, presence of viral mutation(s) within the areas targeted by this assay, and inadequate number of viral copies (<131 copies/mL). A negative result must be combined with clinical observations, patient history, and epidemiological information. The expected result is Negative. Fact Sheet for Patients:  https://www.moore.com/ Fact Sheet for Healthcare Providers:  https://www.young.biz/ This test is not yet ap proved or cleared by the [place] FDA and  has been authorized for detection and/or diagnosis of SARS-CoV-2 by FDA under an Emergency Use Authorization (EUA). This EUA will remain  in effect (meaning this test can be used) for the  duration of the COVID-19 declaration under Section 564(b)(1) of the Act, 21 U.S.C. section 360bbb-3(b)(1), unless the authorization is terminated or revoked sooner.    Influenza A by PCR NEGATIVE NEGATIVE Final   Influenza B by PCR NEGATIVE NEGATIVE Final    Comment: (NOTE) The Xpert Xpress SARS-CoV-2/FLU/RSV assay is intended as an aid in  the diagnosis of influenza from Nasopharyngeal swab specimens and  should not be used as a sole basis for treatment. Nasal washings and  aspirates are unacceptable for Xpert Xpress SARS-CoV-2/FLU/RSV  testing. Fact Sheet for Patients: https://www.moore.com/ Fact Sheet for Healthcare Providers: https://www.young.biz/ This test is not yet approved or cleared by the [place] FDA and  has been authorized for detection and/or diagnosis of SARS-CoV-2 by   FDA under an Emergency Use Authorization (EUA). This EUA will remain  in effect (meaning this test can be used) for the duration of the  Covid-19 declaration under Section 564(b)(1) of the Act, 21  U.S.C. section 360bbb-3(b)(1), unless the authorization is  terminated or revoked. Performed at [facility], [address], [place], [place] [address]     Procedures and diagnostic studies:  DG Chest [facility] 1 View  Result Date: 04/18/2019 CLINICAL DATA:  Chest pain. EXAM: PORTABLE CHEST 1 VIEW COMPARISON:  Chest radiographs 01/27/2019 and earlier FINDINGS: Unchanged mild cardiomegaly. Redemonstrated right-sided aortic arch. No evidence of airspace consolidation within the lungs. No evidence of pleural effusion or pneumothorax. No acute bony abnormality. IMPRESSION: No evidence of acute cardiopulmonary abnormality. Unchanged mild cardiomegaly. Redemonstrated right-sided aortic arch. Electronically Signed   By: Jackey Loge DO   On: 04/18/2019 08:46   US RENAL ARTERY DUPLEX COMPLETE  Result Date: 04/19/2019 CLINICAL DATA:  Labile hypertension EXAM: RENAL/URINARY TRACT ULTRASOUND RENAL DUPLEX DOPPLER ULTRASOUND COMPARISON:  None. FINDINGS: Right Kidney: Length: 8.6. Echogenicity within normal limits. No mass or hydronephrosis visualized. Left Kidney: Length: 9.6. Echogenicity within normal limits. No mass or hydronephrosis visualized. Bladder:  Normal appearance by ultrasound RENAL DUPLEX ULTRASOUND Right Renal Artery Velocities: Origin:  144 cm/sec Mid:  91 cm/sec Hilum:  26 cm/sec Interlobar:  25 cm/sec Arcuate:  12 cm/sec Left Renal Artery Velocities: Origin:  87 cm/sec Mid:  105 cm/sec Hilum:  30 cm/sec Interlobar:  21 cm/sec Arcuate:  15 cm/sec Aortic Velocity:  60 cm/sec Right Renal-Aortic Ratios: Origin: 2.5 Mid:  1.5 Hilum: 0.4 Interlobar: 0.4 Arcuate: 0.2 Left Renal-Aortic Ratios: Origin: 1.4 Mid: 1.7 Hilum: 0.5 Interlobar: 0.4 Arcuate: 0.3 No acute renal abnormality by ultrasound. No  abnormal renal artery velocity or ratio to suggest significant renal artery stenosis by renal duplex. Bladder unremarkable. Renal veins appear patent. IMPRESSION: Negative for significant renal artery stenosis by duplex. No acute renal abnormality. Electronically Signed   By: Judie Petit.  Shick M.D.   On: 04/19/2019 12:07    Medications:   . amLODipine  5 mg Oral Daily  . aspirin EC  81 mg Oral Daily  . buPROPion  300 mg Oral Daily  . clopidogrel  75 mg Oral Daily  . diphenoxylate-atropine  1-4 tablet Oral Daily  . enoxaparin (LOVENOX) injection  40 mg Subcutaneous Q24H  . feeding supplement (ENSURE ENLIVE)  237 mL Oral BID BM  . gabapentin  300 mg Oral BID  . insulin aspart  0-5 Units Subcutaneous QHS  . insulin aspart  0-9 Units Subcutaneous TID WC  . [START ON 04/20/2019] isosorbide mononitrate  90 mg Oral Daily  . levothyroxine  25 mcg Oral Q0600  . losartan  25 mg Oral  Daily  . [START ON 04/20/2019] multivitamin with minerals  1 tablet Oral Daily  . nitroGLYCERIN  1 inch Topical Q6H  . pantoprazole  40 mg Oral BID  . venlafaxine XR  75 mg Oral Q breakfast   Continuous Infusions:   LOS: 0 days   Jabron Weese  [facility]     04/19/2019, 4:09 PM

## 2019-04-19 NOTE — Progress Notes (Signed)
Initial Nutrition Assessment  DOCUMENTATION CODES:   Non-severe (moderate) malnutrition in context of chronic illness  INTERVENTION:   Ensure Enlive po BID, each supplement provides 350 kcal and 20 grams of protein  Snacks  MVI daily   Liberalize diet   Pt at high risk for nutrient deficiencies r/t her h/o Roux-en-y. Recommend check thiamine, B12, folate, iron, calcium, zinc, copper, and vitamins D, A, E, & K yearly.   NUTRITION DIAGNOSIS:   Moderate Malnutrition related to chronic illness(h/o roux-en-y, PUD) as evidenced by 15 percent weight loss in 8 months, mild to moderate fat depletions, moderate muscle depletions.  GOAL:   Patient will meet greater than or equal to 90% of their needs  MONITOR:   PO intake, Supplement acceptance, Labs, Weight trends, Skin, I & O's  REASON FOR ASSESSMENT:   Malnutrition Screening Tool    ASSESSMENT:   49 y.o. female with medical history significant of hypertension, hyperlipidemia, diabetes mellitus, stroke, GERD, hypothyroidism, depression, CAD, DES placement, gastric ulcer, CKD-3, aortic arch aneurysm, Roux-en-y 2011, who presents with chest pain.  Met with pt in room today. Pt reports fair appetite and oral intake at baseline. Pt with h/o gastric bypass; pt reports that she is only able to eat small meals at home. Pt reports that she no longer takes any protein supplements or vitamins at home. Pt reports muscle weakness, poor upper body strength and hair loss. Pt also noted to have moderate muscle and fat depletions. Per chart, pt has lost 24lbs(15%) over the past 6 months; this is significant. Pt is unsure if she has had any vitamin labs checked. Per chart, it appears pt had some vitamin labs checked in 2019 but RD unable to see results. Spoke with patient regarding the importance of adequate nutrition needed to preserve lean muscle. Recommend frequent meals and snacks along with protein supplements to help pt reach at least 80g/day of  protein. Recommended daily chewable multivitamin and having vitamin labs checked yearly. RD will add supplements and MVI to help pt meet her estimated needs. RD will also liberalize the heart healthy portion of pt's diet as this is restrictive of protein. Pt documented to have eaten 100% of her breakfast today.   Medications reviewed and include: aspirin, plavix, heparin, insulin, synthroid, protonix  Labs reviewed: cbgs- 65, 91, 109 x 24 hrs AIC 5.1 wnl  NUTRITION - FOCUSED PHYSICAL EXAM:    Most Recent Value  Orbital Region  No depletion  Upper Arm Region  Moderate depletion  Thoracic and Lumbar Region  Mild depletion  Buccal Region  No depletion  Temple Region  No depletion  Clavicle Bone Region  Moderate depletion  Clavicle and Acromion Bone Region  Moderate depletion  Scapular Bone Region  Mild depletion  Dorsal Hand  Moderate depletion  Patellar Region  Moderate depletion  Anterior Thigh Region  Moderate depletion  Posterior Calf Region  Moderate depletion  Edema (RD Assessment)  None  Hair  Reviewed  Eyes  Reviewed  Mouth  Reviewed  Skin  Reviewed  Nails  Reviewed     Diet Order:   Diet Order            Diet Carb Modified Fluid consistency: Thin; Room service appropriate? Yes  Diet effective now             EDUCATION NEEDS:   Education needs have been addressed  Skin:  Skin Assessment: Reviewed RN Assessment  Last BM:  pta  Height:   Ht Readings from Last  1 Encounters:  04/18/19 '5\' 2"'  (1.575 m)    Weight:   Wt Readings from Last 1 Encounters:  04/19/19 64.4 kg    Ideal Body Weight:  50 kg  BMI:  Body mass index is 25.95 kg/m.  Estimated Nutritional Needs:   Kcal:  1500-1700kcal/day  Protein:  80-90g/day  Fluid:  >1.5L/day  Koleen Distance MS, RD, LDN Contact information available in Amion

## 2019-04-19 NOTE — Progress Notes (Addendum)
Hypoglycemic Event  CBG: 65  Treatment: 8 oz juice/soda  Symptoms: None  Follow-up CBG: Time: 0950 CBG Result: 91  Possible Reasons for Event: Unknown  Comments/MD notified:    Santina Evans

## 2019-04-19 NOTE — Progress Notes (Signed)
Progress Note  Patient Name: [name] Date of Encounter: 04/19/2019  Primary Cardiologist: Ida Rogue, MD   Subjective   Patient still with chest discomfort relieved somewhat with Nitropaste.  High-sensitivity troponins are within normal limits.  Inpatient Medications    Scheduled Meds: . amLODipine  5 mg Oral Daily  . aspirin EC  81 mg Oral Daily  . buPROPion  300 mg Oral Daily  . clopidogrel  75 mg Oral Daily  . diphenoxylate-atropine  1-4 tablet Oral Daily  . feeding supplement (ENSURE ENLIVE)  237 mL Oral BID BM  . gabapentin  300 mg Oral BID  . heparin  5,000 Units Subcutaneous Q8H  . insulin aspart  0-5 Units Subcutaneous QHS  . insulin aspart  0-9 Units Subcutaneous TID WC  . isosorbide mononitrate  60 mg Oral Daily  . levothyroxine  25 mcg Oral Q0600  . losartan  25 mg Oral Daily  . [START ON 04/20/2019] multivitamin with minerals  1 tablet Oral Daily  . nitroGLYCERIN  1 inch Topical Q6H  . pantoprazole  40 mg Oral BID  . venlafaxine XR  75 mg Oral Q breakfast   Continuous Infusions:  PRN Meds: ALPRAZolam, hydrALAZINE, morphine injection, nitroGLYCERIN, ondansetron (ZOFRAN) IV, traZODone   Vital Signs    Vitals:   04/18/19 2147 04/19/19 0426 04/19/19 0920 04/19/19 1215  BP:  114/71 (!) 117/99 131/71  Pulse:  (!) 52 64 65  Resp:  15 19 19   Temp:  97.6 F (36.4 C) (!) 97.5 F (36.4 C) 98.9 F (37.2 C)  TempSrc:  Oral Oral Oral  SpO2:  97% 97% 98%  Weight: 64 kg 64.4 kg    Height: 5\' 2"  (1.575 m)       Intake/Output Summary (Last 24 hours) at 04/19/2019 1348 Last data filed at 04/19/2019 1000 Gross per 24 hour  Intake 240 ml  Output 0 ml  Net 240 ml   Last 3 Weights 04/19/2019 04/18/2019 04/18/2019  Weight (lbs) 141 lb 14.4 oz 141 lb 1.6 oz 148 lb  Weight (kg) 64.365 kg 64.003 kg 67.132 kg  Some encounter information is confidential and restricted. Go to Review Flowsheets activity to see all data.      Telemetry    Sinus  bradycardia, heart rate 47- Personally Reviewed  ECG    No new ECG obtained- Personally Reviewed  Physical Exam   GEN: No acute distress.   Neck: No JVD Cardiac:  Bradycardic, regular, no murmurs, rubs, or gallops.  Respiratory: Clear to auscultation bilaterally. GI: Soft, nontender, non-distended  MS: No edema; No deformity. Neuro:  Nonfocal  Psych: Normal affect   Labs    High Sensitivity Troponin:   Recent Labs  Lab 04/18/19 0810 04/18/19 1246 04/18/19 1546  TROPONINIHS 8 8 8       Chemistry Recent Labs  Lab 04/18/19 0810  NA 142  K 3.6  CL 105  CO2 26  GLUCOSE 74  BUN 14  CREATININE 1.38*  CALCIUM 8.9  PROT 7.1  ALBUMIN 3.8  AST 78*  ALT 51*  ALKPHOS 132*  BILITOT 0.7  GFRNONAA 44*  GFRAA 52*  ANIONGAP 11     Hematology Recent Labs  Lab 04/18/19 0810  WBC 8.9  RBC 5.01  HGB 13.5  HCT 40.9  MCV 81.6  MCH 26.9  MCHC 33.0  RDW 17.0*  PLT 182    BNP Recent Labs  Lab 04/18/19 0910  BNP 114.0*     DDimer No results  for input(s): DDIMER in the last 168 hours.   Radiology    DG Chest Port 1 View  Result Date: 04/18/2019 CLINICAL DATA:  Chest pain. EXAM: PORTABLE CHEST 1 VIEW COMPARISON:  Chest radiographs 01/27/2019 and earlier FINDINGS: Unchanged mild cardiomegaly. Redemonstrated right-sided aortic arch. No evidence of airspace consolidation within the lungs. No evidence of pleural effusion or pneumothorax. No acute bony abnormality. IMPRESSION: No evidence of acute cardiopulmonary abnormality. Unchanged mild cardiomegaly. Redemonstrated right-sided aortic arch. Electronically Signed   By: Kellie Simmering DO   On: 04/18/2019 08:46   US RENAL ARTERY DUPLEX COMPLETE  Result Date: 04/19/2019 CLINICAL DATA:  Labile hypertension EXAM: RENAL/URINARY TRACT ULTRASOUND RENAL DUPLEX DOPPLER ULTRASOUND COMPARISON:  None. FINDINGS: Right Kidney: Length: 8.6. Echogenicity within normal limits. No mass or hydronephrosis visualized. Left Kidney: Length:  9.6. Echogenicity within normal limits. No mass or hydronephrosis visualized. Bladder:  Normal appearance by ultrasound RENAL DUPLEX ULTRASOUND Right Renal Artery Velocities: Origin:  144 cm/sec Mid:  91 cm/sec Hilum:  26 cm/sec Interlobar:  25 cm/sec Arcuate:  12 cm/sec Left Renal Artery Velocities: Origin:  87 cm/sec Mid:  105 cm/sec Hilum:  30 cm/sec Interlobar:  21 cm/sec Arcuate:  15 cm/sec Aortic Velocity:  60 cm/sec Right Renal-Aortic Ratios: Origin: 2.5 Mid:  1.5 Hilum: 0.4 Interlobar: 0.4 Arcuate: 0.2 Left Renal-Aortic Ratios: Origin: 1.4 Mid: 1.7 Hilum: 0.5 Interlobar: 0.4 Arcuate: 0.3 No acute renal abnormality by ultrasound. No abnormal renal artery velocity or ratio to suggest significant renal artery stenosis by renal duplex. Bladder unremarkable. Renal veins appear patent. IMPRESSION: Negative for significant renal artery stenosis by duplex. No acute renal abnormality. Electronically Signed   By: Jerilynn Mages.  Shick M.D.   On: 04/19/2019 12:07    Cardiac Studies   TTE (01/28/2019): 1. Left ventricular ejection fraction, by visual estimation, is 60 to  65%. The left ventricle has normal function. There is no left ventricular  hypertrophy.  2. Left ventricular diastolic parameters are consistent with Grade I  diastolic dysfunction (impaired relaxation).  3. The left ventricle has no regional wall motion abnormalities.  4. Global right ventricle has normal systolic function.The right  ventricular size is normal. No increase in right ventricular wall  thickness.  5. Left atrial size was normal.  6. TR signal is inadequate for assessing pulmonary artery systolic pressure.  Pharmacologic MPI (02/08/2019): Low risk study without significant ischemia or scar.  LVEF 29%, likely artifactually low due to GI uptake.  Patient Profile     49 y.o. female with hx of coronary artery disease status post multiple interventions to the LAD and diagonal branches, hypertension, stroke, left carotid to  subclavian artery bypass with subclavian artery ligation, right-sided aortic arch with aortic arch aneurysm, chronic kidney disease stage III, presenting with chest pain and elevated blood pressures.  Assessment & Plan    1.  Chest pain, history of CAD/PCI -hs troponins wnl -increase imdur to 90mg  qd to help with angina like pain -hold BB due brady with HR 47 -cont asa, plavix. -last myoview on 02/08/2019 with no ischemia   2. htn urgency -BP now controlled -cont amlodipine, losartan, imdur    Signed, Kate Sable, MD  04/19/2019, 1:48 PM

## 2019-04-19 NOTE — Plan of Care (Signed)
  Problem: Clinical Measurements: Goal: Cardiovascular complication will be avoided Outcome: Progressing   Problem: Coping: Goal: Level of anxiety will decrease Outcome: Progressing   

## 2019-04-20 ENCOUNTER — Encounter: Payer: Self-pay | Admitting: Cardiology

## 2019-04-20 ENCOUNTER — Encounter
Admission: EM | Disposition: A | Discharge status: Home / Self Care | Payer: Self-pay | Source: Home / Self Care | Attending: Internal Medicine

## 2019-04-20 DIAGNOSIS — I25110 Atherosclerotic heart disease of native coronary artery with unstable angina pectoris: Principal | ICD-10-CM

## 2019-04-20 DIAGNOSIS — N183 Chronic kidney disease, stage 3 unspecified: Secondary | ICD-10-CM

## 2019-04-20 DIAGNOSIS — N184 Chronic kidney disease, stage 4 (severe): Secondary | ICD-10-CM

## 2019-04-20 DIAGNOSIS — E1122 Type 2 diabetes mellitus with diabetic chronic kidney disease: Secondary | ICD-10-CM

## 2019-04-20 DIAGNOSIS — I200 Unstable angina: Secondary | ICD-10-CM

## 2019-04-20 DIAGNOSIS — I2510 Atherosclerotic heart disease of native coronary artery without angina pectoris: Secondary | ICD-10-CM

## 2019-04-20 DIAGNOSIS — I129 Hypertensive chronic kidney disease with stage 1 through stage 4 chronic kidney disease, or unspecified chronic kidney disease: Secondary | ICD-10-CM

## 2019-04-20 HISTORY — PX: LEFT HEART CATH AND CORONARY ANGIOGRAPHY: CATH118249

## 2019-04-20 LAB — COMPREHENSIVE METABOLIC PANEL
ALT: 27 U/L (ref 0–44)
AST: 20 U/L (ref 15–41)
Albumin: 3 g/dL — ABNORMAL LOW (ref 3.5–5.0)
Alkaline Phosphatase: 98 U/L (ref 38–126)
Anion gap: 5 (ref 5–15)
BUN: 27 mg/dL — ABNORMAL HIGH (ref 6–20)
CO2: 31 mmol/L (ref 22–32)
Calcium: 8.7 mg/dL — ABNORMAL LOW (ref 8.9–10.3)
Chloride: 100 mmol/L (ref 98–111)
Creatinine, Ser: 1.98 mg/dL — ABNORMAL HIGH (ref 0.44–1.00)
GFR calc Af Amer: 33 mL/min — ABNORMAL LOW (ref >60)
GFR calc non Af Amer: 29 mL/min — ABNORMAL LOW (ref >60)
Glucose, Bld: 77 mg/dL (ref 70–99)
Potassium: 4.4 mmol/L (ref 3.5–5.1)
Sodium: 136 mmol/L (ref 135–145)
Total Bilirubin: 0.8 mg/dL (ref 0.3–1.2)
Total Protein: 5.7 g/dL — ABNORMAL LOW (ref 6.5–8.1)

## 2019-04-20 LAB — GLUCOSE, CAPILLARY
Glucose-Capillary: 134 mg/dL — ABNORMAL HIGH (ref 70–99)
Glucose-Capillary: 65 mg/dL — ABNORMAL LOW (ref 70–99)
Glucose-Capillary: 56 mg/dL — ABNORMAL LOW (ref 70–99)
Glucose-Capillary: 62 mg/dL — ABNORMAL LOW (ref 70–99)
Glucose-Capillary: 62 mg/dL — ABNORMAL LOW (ref 70–99)
Glucose-Capillary: 82 mg/dL (ref 70–99)
Glucose-Capillary: 113 mg/dL — ABNORMAL HIGH (ref 70–99)
Glucose-Capillary: 80 mg/dL (ref 70–99)

## 2019-04-20 SURGERY — LEFT HEART CATH AND CORONARY ANGIOGRAPHY
Anesthesia: Moderate Sedation

## 2019-04-20 MED ORDER — FENTANYL CITRATE (PF) 100 MCG/2ML IJ SOLN
INTRAMUSCULAR | Status: DC | PRN
  Administered 2019-04-20 (×2): 25 ug via INTRAVENOUS

## 2019-04-20 MED ORDER — DEXTROSE-NACL 5-0.9 % IV SOLN: INTRAVENOUS | Status: DC

## 2019-04-20 MED ORDER — IOHEXOL 300 MG/ML  SOLN
INTRAMUSCULAR | Status: DC | PRN
  Administered 2019-04-20: 45 mL

## 2019-04-20 MED ORDER — MIDAZOLAM HCL 2 MG/2ML IJ SOLN
INTRAMUSCULAR | Status: AC
  Filled 2019-04-20: qty 2

## 2019-04-20 MED ORDER — DEXTROSE 50 % IV SOLN
12.5000 g | Freq: Once | INTRAVENOUS | Status: AC
  Administered 2019-04-20: 12.5 g via INTRAVENOUS
  Filled 2019-04-20: qty 50

## 2019-04-20 MED ORDER — SODIUM CHLORIDE 0.9 % WEIGHT BASED INFUSION: 3.0000 mL/kg/h | INTRAVENOUS | Status: DC

## 2019-04-20 MED ORDER — FENTANYL CITRATE (PF) 100 MCG/2ML IJ SOLN
INTRAMUSCULAR | Status: AC
  Filled 2019-04-20: qty 2

## 2019-04-20 MED ORDER — HEPARIN (PORCINE) IN NACL 1000-0.9 UT/500ML-% IV SOLN
INTRAVENOUS | Status: DC | PRN
  Administered 2019-04-20: 500 mL

## 2019-04-20 MED ORDER — ASPIRIN 81 MG PO CHEW
81.0000 mg | CHEWABLE_TABLET | ORAL | Status: AC
  Administered 2019-04-20: 81 mg via ORAL

## 2019-04-20 MED ORDER — SODIUM CHLORIDE 0.9 % IV SOLN: INTRAVENOUS | Status: AC

## 2019-04-20 MED ORDER — ISOSORBIDE MONONITRATE ER 30 MG PO TB24
30.0000 mg | ORAL_TABLET | Freq: Every day | ORAL | Status: DC
  Administered 2019-04-20 – 2019-04-21 (×2): 30 mg via ORAL
  Filled 2019-04-20 (×2): qty 1

## 2019-04-20 MED ORDER — DEXTROSE IN LACTATED RINGERS 5 % IV SOLN: INTRAVENOUS | Status: DC

## 2019-04-20 MED ORDER — ASPIRIN 81 MG PO CHEW
CHEWABLE_TABLET | ORAL | Status: AC
  Filled 2019-04-20: qty 1

## 2019-04-20 MED ORDER — HEPARIN (PORCINE) IN NACL 1000-0.9 UT/500ML-% IV SOLN
INTRAVENOUS | Status: AC
  Filled 2019-04-20: qty 1000

## 2019-04-20 MED ORDER — SODIUM CHLORIDE 0.9 % WEIGHT BASED INFUSION: 1.0000 mL/kg/h | INTRAVENOUS | Status: DC

## 2019-04-20 MED ORDER — MIDAZOLAM HCL 2 MG/2ML IJ SOLN
INTRAMUSCULAR | Status: DC | PRN
  Administered 2019-04-20 (×2): 1 mg via INTRAVENOUS

## 2019-04-20 MED ORDER — ENOXAPARIN SODIUM 30 MG/0.3ML ~~LOC~~ SOLN
30.0000 mg | SUBCUTANEOUS | Status: DC
  Administered 2019-04-20: 30 mg via SUBCUTANEOUS
  Filled 2019-04-20: qty 0.3

## 2019-04-20 MED ORDER — SODIUM CHLORIDE 0.9% FLUSH
10.0000 mL | Freq: Two times a day (BID) | INTRAVENOUS | Status: DC
  Administered 2019-04-20: 10 mL via INTRAVENOUS

## 2019-04-20 SURGICAL SUPPLY — 9 items
CATH INFINITI 5FR JL4 (CATHETERS) ×2 IMPLANT
CATH INFINITI JR4 5F (CATHETERS) ×3 IMPLANT
DEVICE CLOSURE MYNXGRIP 5F (Vascular Products) ×2 IMPLANT
KIT MANI 3VAL PERCEP (MISCELLANEOUS) ×3 IMPLANT
NDL PERC 18GX7CM (NEEDLE) IMPLANT
NEEDLE PERC 18GX7CM (NEEDLE) ×3 IMPLANT
PACK CARDIAC CATH (CUSTOM PROCEDURE TRAY) ×3 IMPLANT
SHEATH AVANTI 5FR X 11CM (SHEATH) ×2 IMPLANT
WIRE GUIDERIGHT .035X150 (WIRE) ×2 IMPLANT

## 2019-04-20 NOTE — Progress Notes (Signed)
Hypoglycemic Event  CBG: 62  Treatment: 4 oz juice   Symptoms: asymptomatic   Follow-up CBG: Time:1331 CBG Result: 82  Possible Reasons for Event: Pt NPO for cath   Comments/MD notified:none   Feliberto Gottron

## 2019-04-20 NOTE — Progress Notes (Addendum)
Progress Note    [name]  NUU:[id number] DOB: [birth year]  DOA: 04/18/2019 PCP: [name], MD      Brief Narrative:    Medical records reviewed and are as summarized below:  [name] [name] is an 49 y.o. female with medical history significant of hypertension, hyperlipidemia, diabetes mellitus, stroke, GERD, hypothyroidism, depression with 60, CAD, DES placement, gastric ulcer, CKD-4, aortic arch aneurysm, who presented with chest pain.  Patient had elevated blood pressure 204/92 in ED, stating that she is taking blood pressure medication consistently      Assessment/Plan:   Principal Problem:   Hypertensive urgency Active Problems:   Depression with anxiety   Stroke (HCC)   GERD (gastroesophageal reflux disease)   Hypothyroidism   Type 2 DM with CKD stage 4 and hypertension (HCC)   CAD in native artery   Chest pain   Abnormal LFTs   Malnutrition of moderate degree  Chest pain in a patient with underlying CAD status post drug-eluting stent: Troponins negative.  Continue aspirin and Plavix. S/p cardiac cath:   Final Conclusions:   In comparison to images from catheterization 2019 no significant change Severe native coronary disease, Patent stents LAD and diagonal vessel Stable severe ostial/proximal diagonal #2 disease, no change from previous catheterization 2019, not amenable to stenting Distal LAD is small and tapers, severe disease, not amenable to stenting  Recommendations:  Continue aggressive medical management, LDL less than 70, aggressive diabetes control, antianginals Of note left ventricular end-diastolic pressure was low consistent with dehydration, would continue IV fluids   Hypertensive urgency: BP has improved.  Continue antihypertensives.  Renal duplex did not show any evidence of hemodynamically significant stenosis.  Depression and anxiety: Stable, no suicidal or homicidal ideations. -Continue home medications    Stroke [facility]): -ASA, plavix  GERD (gastroesophageal reflux disease) -protonix  Hypothyroidism:  -Synthroid  Type 2 diabetes mellitus with stage 4 chronic kidney disease, without long-term current use of insulin (HCC)/hypoglycemia: Most recent A1c 5.1, well controled.   Metformin will be discontinued at discharge because of recurrent hypoglycemia.  Dehydration/CKD (chronic kidney disease), stage IV: Patient had a bump in creatinine overnight.  Given recent contrast exposure from left heart cath and dehydration noted on cath report, IV fluids that was started prior to left heart cath will be continued.  Baseline creatinine 1.5-2.3.  -Follow-up  BMP  Moderate protein calorie malnutrition: Encouraged adequate oral intake and use of nutritional supplements.  Follow-up with dietitian.  Mildly elevated liver enzymes: Liver enzymes improved. Acute hepatitis panel and HIV antibody nonreactive   Body mass index is 25.62 kg/m.   Family Communication/Anticipated D/C date and plan/Code Status   DVT prophylaxis: Lovenox Code Status: Full code Family Communication: Plan discussed with patient Disposition Plan: Patient is from home.  Plan to discharge home tomorrow when dehydration improves     Subjective:   No chest pain.  She feels a little better today.   Objective:    Vitals:   04/20/19 1200 04/20/19 1215 04/20/19 1230 04/20/19 1317  BP: 125/65 111/66 107/65 131/67  Pulse: 64 60 (!) 58 (!) 55  Resp:   14 18  Temp:    98 F (36.7 C)  TempSrc:    Oral  SpO2: 98% 97% 96% 100%  Weight:      Height:        Intake/Output Summary (Last 24 hours) at 04/20/2019 1444 Last data filed at 04/20/2019 0455 Gross per 24 hour  Intake --  Output 830 ml  Net -830 ml   Filed Weights   04/18/19 2147 04/19/19 0426 04/20/19 0452  Weight: 64 kg 64.4 kg 63.5 kg    Exam:  GEN: NAD SKIN: No rash EYES: No pallor or icterus ENT: MMM CV: RRR PULM: CTA B ABD: soft, ND, NT,  +BS CNS: AAO x 3, non focal EXT: No edema or tenderness   Data Reviewed:   I have personally reviewed following labs and imaging studies:  Labs: Labs show the following:   Basic Metabolic Panel: Recent Labs  Lab 04/18/19 0810 04/20/19 0625  NA 142 136  K 3.6 4.4  CL 105 100  CO2 26 31  GLUCOSE 74 77  BUN 14 27*  CREATININE 1.38* 1.98*  CALCIUM 8.9 8.7*   GFR Estimated Creatinine Clearance: 29.8 mL/min (A) (by C-G formula based on SCr of 1.98 mg/dL (H)). Liver Function Tests: Recent Labs  Lab 04/18/19 0810 04/20/19 0625  AST 78* 20  ALT 51* 27  ALKPHOS 132* 98  BILITOT 0.7 0.8  PROT 7.1 5.7*  ALBUMIN 3.8 3.0*   No results for input(s): LIPASE, AMYLASE in the last 168 hours. No results for input(s): AMMONIA in the last 168 hours. Coagulation profile No results for input(s): INR, PROTIME in the last 168 hours.  CBC: Recent Labs  Lab 04/18/19 0810  WBC 8.9  HGB 13.5  HCT 40.9  MCV 81.6  PLT 182   Cardiac Enzymes: No results for input(s): CKTOTAL, CKMB, CKMBINDEX, TROPONINI in the last 168 hours. BNP (last 3 results) No results for input(s): PROBNP in the last 8760 hours. CBG: Recent Labs  Lab 04/20/19 0829 04/20/19 1026 04/20/19 1027 04/20/19 1315 04/20/19 1331  GLUCAP 134* 56* 62* 62* 82   D-Dimer: No results for input(s): DDIMER in the last 72 hours. Hgb A1c: Recent Labs    04/19/19 0408  HGBA1C 5.1   Lipid Profile: Recent Labs    04/19/19 0408  CHOL 125  HDL 34*  LDLCALC 76  TRIG 75  CHOLHDL 3.7   Thyroid function studies: No results for input(s): TSH, T4TOTAL, T3FREE, THYROIDAB in the last 72 hours.  Invalid input(s): FREET3 Anemia work up: No results for input(s): VITAMINB12, FOLATE, FERRITIN, TIBC, IRON, RETICCTPCT in the last 72 hours. Sepsis Labs: Recent Labs  Lab 04/18/19 0810  WBC 8.9    Microbiology Recent Results (from the past 240 hour(s))  Respiratory Panel by RT PCR (Flu A&B, Covid) - Nasopharyngeal Swab      Status: None   Collection Time: 04/18/19  9:14 AM   Specimen: Nasopharyngeal Swab  Result Value Ref Range Status   SARS Coronavirus 2 by RT PCR NEGATIVE NEGATIVE Final    Comment: (NOTE) SARS-CoV-2 target nucleic acids are NOT DETECTED. The SARS-CoV-2 RNA is generally detectable in upper respiratoy specimens during the acute phase of infection. The lowest concentration of SARS-CoV-2 viral copies this assay can detect is 131 copies/mL. A negative result does not preclude SARS-Cov-2 infection and should not be used as the sole basis for treatment or other patient management decisions. A negative result may occur with  improper specimen collection/handling, submission of specimen other than nasopharyngeal swab, presence of viral mutation(s) within the areas targeted by this assay, and inadequate number of viral copies (<131 copies/mL). A negative result must be combined with clinical observations, patient history, and epidemiological information. The expected result is Negative. Fact Sheet for Patients:  https://www.moore.com/ Fact Sheet for Healthcare Providers:  https://www.young.biz/ This test is not yet ap  proved or cleared by the [place] and  has been authorized for detection and/or diagnosis of SARS-CoV-2 by FDA under an Emergency Use Authorization (EUA). This EUA will remain  in effect (meaning this test can be used) for the duration of the COVID-19 declaration under Section 564(b)(1) of the Act, 21 U.S.C. section 360bbb-3(b)(1), unless the authorization is terminated or revoked sooner.    Influenza A by PCR NEGATIVE NEGATIVE Final   Influenza B by PCR NEGATIVE NEGATIVE Final    Comment: (NOTE) The Xpert Xpress SARS-CoV-2/FLU/RSV assay is intended as an aid in  the diagnosis of influenza from Nasopharyngeal swab specimens and  should not be used as a sole basis for treatment. Nasal washings and  aspirates are unacceptable for  Xpert Xpress SARS-CoV-2/FLU/RSV  testing. Fact Sheet for Patients: https://www.moore.com/ Fact Sheet for Healthcare Providers: https://www.young.biz/ This test is not yet approved or cleared by the [place] FDA and  has been authorized for detection and/or diagnosis of SARS-CoV-2 by  FDA under an Emergency Use Authorization (EUA). This EUA will remain  in effect (meaning this test can be used) for the duration of the  Covid-19 declaration under Section 564(b)(1) of the Act, 21  U.S.C. section 360bbb-3(b)(1), unless the authorization is  terminated or revoked. Performed at [facility], [address] [place], [place] [address]     Procedures and diagnostic studies:  CARDIAC CATHETERIZATION  Result Date: 04/20/2019  Non-stenotic 1st Diag lesion was previously treated.  Ost 1st Diag lesion is 40% stenosed.  Mid LAD-2 lesion is 5% stenosed.  Ost 2nd Diag to 2nd Diag lesion is 95% stenosed.  Mid LAD-1 lesion is 30% stenosed.  Dist LAD lesion is 80% stenosed.  Prox Cx lesion is 40% stenosed.  Mid RCA lesion is 30% stenosed.    US RENAL ARTERY DUPLEX COMPLETE  Result Date: 04/19/2019 CLINICAL DATA:  Labile hypertension EXAM: RENAL/URINARY TRACT ULTRASOUND RENAL DUPLEX DOPPLER ULTRASOUND COMPARISON:  None. FINDINGS: Right Kidney: Length: 8.6. Echogenicity within normal limits. No mass or hydronephrosis visualized. Left Kidney: Length: 9.6. Echogenicity within normal limits. No mass or hydronephrosis visualized. Bladder:  Normal appearance by ultrasound RENAL DUPLEX ULTRASOUND Right Renal Artery Velocities: Origin:  144 cm/sec Mid:  91 cm/sec Hilum:  26 cm/sec Interlobar:  25 cm/sec Arcuate:  12 cm/sec Left Renal Artery Velocities: Origin:  87 cm/sec Mid:  105 cm/sec Hilum:  30 cm/sec Interlobar:  21 cm/sec Arcuate:  15 cm/sec Aortic Velocity:  60 cm/sec Right Renal-Aortic Ratios: Origin: 2.5 Mid:  1.5 Hilum: 0.4 Interlobar: 0.4 Arcuate:  0.2 Left Renal-Aortic Ratios: Origin: 1.4 Mid: 1.7 Hilum: 0.5 Interlobar: 0.4 Arcuate: 0.3 No acute renal abnormality by ultrasound. No abnormal renal artery velocity or ratio to suggest significant renal artery stenosis by renal duplex. Bladder unremarkable. Renal veins appear patent. IMPRESSION: Negative for significant renal artery stenosis by duplex. No acute renal abnormality. Electronically Signed   By: Judie Petit.  Shick M.D.   On: 04/19/2019 12:07    Medications:   . aspirin      . aspirin EC  81 mg Oral Daily  . buPROPion  300 mg Oral Daily  . clopidogrel  75 mg Oral Daily  . diphenoxylate-atropine  1-4 tablet Oral Daily  . enoxaparin (LOVENOX) injection  30 mg Subcutaneous Q24H  . feeding supplement (ENSURE ENLIVE)  237 mL Oral BID BM  . gabapentin  300 mg Oral BID  . insulin aspart  0-5 Units Subcutaneous QHS  . insulin aspart  0-9 Units Subcutaneous  TID WC  . isosorbide mononitrate  30 mg Oral Daily  . levothyroxine  25 mcg Oral Q0600  . losartan  25 mg Oral Daily  . multivitamin with minerals  1 tablet Oral Daily  . nitroGLYCERIN  1 inch Topical Q6H  . pantoprazole  40 mg Oral BID  . venlafaxine XR  75 mg Oral Q breakfast   Continuous Infusions: . dextrose 5 % and 0.9% NaCl 75 mL/hr at 04/20/19 0817     LOS: 1 day   Shirla Hodgkiss  [facility]     04/20/2019, 2:44 PM

## 2019-04-20 NOTE — Progress Notes (Addendum)
Hypoglycemic Event  CBG: 65  Treatment: 12.5gm D50 IV , Continuous Dextrose 5% -09.% Sodium Chloride Infusion   Symptoms: asymptomatic   Follow-up CBG: Time: 0829 CBG Result:134  Possible Reasons for Event: Patient is NPO   Comments/MD notified:none    [name]

## 2019-04-20 NOTE — Progress Notes (Addendum)
Progress Note  Patient Name: [name] Date of Encounter: 04/20/2019  Primary Cardiologist: Ida Rogue, MD -[facility]  Subjective   2 chest pain overnight and this morning Nitropaste seems to help somewhat, also sublingual nitro She is concerned about her unstable angina symptoms, -Long discussion concerning symptoms that brought her in [place] like a horse kicked her in the chest, required several nitro  Inpatient Medications    Scheduled Meds: . amLODipine  5 mg Oral Daily  . aspirin EC  81 mg Oral Daily  . buPROPion  300 mg Oral Daily  . clopidogrel  75 mg Oral Daily  . diphenoxylate-atropine  1-4 tablet Oral Daily  . enoxaparin (LOVENOX) injection  40 mg Subcutaneous Q24H  . feeding supplement (ENSURE ENLIVE)  237 mL Oral BID BM  . gabapentin  300 mg Oral BID  . insulin aspart  0-5 Units Subcutaneous QHS  . insulin aspart  0-9 Units Subcutaneous TID WC  . isosorbide mononitrate  90 mg Oral Daily  . levothyroxine  25 mcg Oral Q0600  . losartan  25 mg Oral Daily  . multivitamin with minerals  1 tablet Oral Daily  . nitroGLYCERIN  1 inch Topical Q6H  . pantoprazole  40 mg Oral BID  . venlafaxine XR  75 mg Oral Q breakfast   Continuous Infusions: . dextrose 5 % and 0.9% NaCl     PRN Meds: ALPRAZolam, hydrALAZINE, morphine injection, nitroGLYCERIN, ondansetron (ZOFRAN) IV, traZODone   Vital Signs    Vitals:   04/19/19 1609 04/19/19 1935 04/20/19 0452 04/20/19 0746  BP: 107/70 112/69 107/61 97/68  Pulse: 72 68 64 63  Resp: 19 20 20 19   Temp: 98.2 F (36.8 C) 98.1 F (36.7 C) 98 F (36.7 C) 98.5 F (36.9 C)  TempSrc: Oral Oral Oral Oral  SpO2: 93% 97% 98% 100%  Weight:   63.5 kg   Height:        Intake/Output Summary (Last 24 hours) at 04/20/2019 0809 Last data filed at 04/20/2019 0455 Gross per 24 hour  Intake 240 ml  Output 830 ml  Net -590 ml   Last 3 Weights 04/20/2019 04/19/2019 04/18/2019  Weight (lbs) 140 lb 1.6 oz 141 lb 14.4 oz 141 lb  1.6 oz  Weight (kg) 63.549 kg 64.365 kg 64.003 kg  Some encounter information is confidential and restricted. Go to Review Flowsheets activity to see all data.      Telemetry    NSR - Personally Reviewed  ECG     - Personally Reviewed  Physical Exam   GEN: No acute distress.   Neck: No JVD Cardiac: RRR, no murmurs, rubs, or gallops.  Respiratory: Clear to auscultation bilaterally. GI: Soft, nontender, non-distended  MS: No edema; No deformity. Neuro:  Nonfocal  Psych: Normal affect   Labs    High Sensitivity Troponin:   Recent Labs  Lab 04/18/19 0810 04/18/19 1246 04/18/19 1546  TROPONINIHS 8 8 8       Chemistry Recent Labs  Lab 04/18/19 0810 04/20/19 0625  NA 142 136  K 3.6 4.4  CL 105 100  CO2 26 31  GLUCOSE 74 77  BUN 14 27*  CREATININE 1.38* 1.98*  CALCIUM 8.9 8.7*  PROT 7.1 5.7*  ALBUMIN 3.8 3.0*  AST 78* 20  ALT 51* 27  ALKPHOS 132* 98  BILITOT 0.7 0.8  GFRNONAA 44* 29*  GFRAA 52* 33*  ANIONGAP 11 5     Hematology Recent Labs  Lab 04/18/19 0810  WBC 8.9  RBC 5.01  HGB 13.5  HCT 40.9  MCV 81.6  MCH 26.9  MCHC 33.0  RDW 17.0*  PLT 182    BNP Recent Labs  Lab 04/18/19 0910  BNP 114.0*     DDimer No results for input(s): DDIMER in the last 168 hours.   Radiology    DG Chest Port 1 View  Result Date: 04/18/2019 CLINICAL DATA:  Chest pain. EXAM: PORTABLE CHEST 1 VIEW COMPARISON:  Chest radiographs 01/27/2019 and earlier FINDINGS: Unchanged mild cardiomegaly. Redemonstrated right-sided aortic arch. No evidence of airspace consolidation within the lungs. No evidence of pleural effusion or pneumothorax. No acute bony abnormality. IMPRESSION: No evidence of acute cardiopulmonary abnormality. Unchanged mild cardiomegaly. Redemonstrated right-sided aortic arch. Electronically Signed   By: Kellie Simmering DO   On: 04/18/2019 08:46   US RENAL ARTERY DUPLEX COMPLETE  Result Date: 04/19/2019 CLINICAL DATA:  Labile hypertension EXAM:  RENAL/URINARY TRACT ULTRASOUND RENAL DUPLEX DOPPLER ULTRASOUND COMPARISON:  None. FINDINGS: Right Kidney: Length: 8.6. Echogenicity within normal limits. No mass or hydronephrosis visualized. Left Kidney: Length: 9.6. Echogenicity within normal limits. No mass or hydronephrosis visualized. Bladder:  Normal appearance by ultrasound RENAL DUPLEX ULTRASOUND Right Renal Artery Velocities: Origin:  144 cm/sec Mid:  91 cm/sec Hilum:  26 cm/sec Interlobar:  25 cm/sec Arcuate:  12 cm/sec Left Renal Artery Velocities: Origin:  87 cm/sec Mid:  105 cm/sec Hilum:  30 cm/sec Interlobar:  21 cm/sec Arcuate:  15 cm/sec Aortic Velocity:  60 cm/sec Right Renal-Aortic Ratios: Origin: 2.5 Mid:  1.5 Hilum: 0.4 Interlobar: 0.4 Arcuate: 0.2 Left Renal-Aortic Ratios: Origin: 1.4 Mid: 1.7 Hilum: 0.5 Interlobar: 0.4 Arcuate: 0.3 No acute renal abnormality by ultrasound. No abnormal renal artery velocity or ratio to suggest significant renal artery stenosis by renal duplex. Bladder unremarkable. Renal veins appear patent. IMPRESSION: Negative for significant renal artery stenosis by duplex. No acute renal abnormality. Electronically Signed   By: Jerilynn Mages.  Shick M.D.   On: 04/19/2019 12:07    Cardiac Studies     Patient Profile     Holliday is a 49 y.o. female past medical history of smoking,  strokes, 2010 chest pain dating back to 2005  CAD, Previous LAD stent normal LV function,  s/p gastric bypass surgery,  Baseline creatinine 1.5, previously taken off her diuretic Catheterization May 2017 with stent placed to her first diagonal, there was residual 50% distal LAD disease, 30% proximal, 40% proximal circumflex, patent LAD stent Labile pressures Who presents for unstable angina symptoms  Assessment & Plan    1  Unstable angina Continues to have chest pain despite improved blood pressure, and on long-acting nitros Discussed various treatment options with her I have reviewed the risks, indications, and  alternatives to cardiac catheterization, possible angioplasty, and stenting with the patient. Risks include but are not limited to bleeding, infection, vascular injury, stroke, myocardial infection, arrhythmia, kidney injury, radiation-related injury in the case of prolonged fluoroscopy use, emergency cardiac surgery, and death. The patient understands the risks of serious complication is 1-2 in 6948 with diagnostic cardiac cath and 1-2% or less with angioplasty/stenting.  --We will arrange left heart catheterization possible PCI this morning  2.  Hypertensive urgency Continue  losartan, Imdur Given hypotension will need to back down on the doses --Systolic pressure in the 54O today, numerous low pressures over 24 hours Has not received morning medications We will hold the amlodipine, decrease Imdur down to 30 daily (this is a new medication that she  was not on as outpatient received dose of 90  mg yesterday) ----Suspect Imdur was tried before and may have been held secondary to headache, will discuss with her --Baseline bradycardia, unable to tolerate beta-blockers  3. PAD Left carotid to subclavian artery bypass with subclavian artery ligation -Carotid ultrasound  4.  Hyperlipidemia Cholesterol is at goal on the current lipid regimen. No changes to the medications were made.  5.  Acute on chronic renal failure Bump in creatinine 1.9, likely from poor fluid intake this admission In Preparation for catheterization we will start IV hydration this morning   Long discussion with her concerning plan as detailed above Risk and benefit of procedure discussed She is willing to proceed  Total encounter time more than 35 minutes  Greater than 50% was spent in counseling and coordination of care with the patient    For questions or updates, please contact [facility] Please consult [email or web address] for contact info under        Signed, Ida Rogue, MD  04/20/2019, 8:09 AM

## 2019-04-20 NOTE — Progress Notes (Signed)
PHARMACIST - PHYSICIAN COMMUNICATION  CONCERNING:  Enoxaparin (Lovenox) for DVT Prophylaxis    RECOMMENDATION: Patient was prescribed enoxaparin 40mg  q24 hours for VTE prophylaxis.   Filed Weights   04/18/19 2147 04/19/19 0426 04/20/19 0452  Weight: 141 lb 1.6 oz (64 kg) 141 lb 14.4 oz (64.4 kg) 140 lb 1.6 oz (63.5 kg)    Body mass index is 25.62 kg/m.  Estimated Creatinine Clearance: 29.8 mL/min (A) (by C-G formula based on SCr of 1.98 mg/dL (H)).   Patient is candidate for enoxaparin 30mg  every 24 hours based on CrCl <80ml/min or Weight less then 45kg for women or <57kg for men   DESCRIPTION: Pharmacy has adjusted enoxaparin dose per [facility] policy.  Patient is now receiving enoxaparin 30mg  every 24 hours.  [place] Resident 04/20/2019 10:37 AM

## 2019-04-20 NOTE — Progress Notes (Signed)
No complaints of pain from patient. Orders from MD to d/c scheduled nitro paste.

## 2019-04-21 LAB — BASIC METABOLIC PANEL
Anion gap: 8 (ref 5–15)
BUN: 24 mg/dL — ABNORMAL HIGH (ref 6–20)
CO2: 25 mmol/L (ref 22–32)
Calcium: 8.5 mg/dL — ABNORMAL LOW (ref 8.9–10.3)
Chloride: 111 mmol/L (ref 98–111)
Creatinine, Ser: 1.61 mg/dL — ABNORMAL HIGH (ref 0.44–1.00)
GFR calc Af Amer: 43 mL/min — ABNORMAL LOW (ref >60)
GFR calc non Af Amer: 37 mL/min — ABNORMAL LOW (ref >60)
Glucose, Bld: 107 mg/dL — ABNORMAL HIGH (ref 70–99)
Potassium: 4.4 mmol/L (ref 3.5–5.1)
Sodium: 144 mmol/L (ref 135–145)

## 2019-04-21 LAB — URINE CULTURE

## 2019-04-21 LAB — GLUCOSE, CAPILLARY: Glucose-Capillary: 93 mg/dL (ref 70–99)

## 2019-04-21 MED ORDER — ALPRAZOLAM 1 MG PO TABS: 1.0000 mg | ORAL_TABLET | Freq: Two times a day (BID) | ORAL | Status: DC | PRN

## 2019-04-21 NOTE — Discharge Summary (Signed)
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  Diet recommendation: Low-salt diet  Code status: Full code.    Hospital Course:    [name] is a 49 y.o. female with medical history significant of hypertension, hyperlipidemia, diabetes mellitus, stroke, GERD, hypothyroidism, depression with 60, CAD, DES placement, gastric ulcer, CKD-3, aortic arch aneurysm, who presents with chest pain.  She had hypertensive urgency in the emergency room. Troponins were negative.  Given her multiple risk factors, she was evaluated by the cardiologist and she underwent left heart catheterization.  She does have underlying severe native coronary artery disease and stents in LAD and diagonal vessels were patent.  There was no significant change in left heart cath findings compared to findings from left heart cath in 2019.  Of note, she was given IV fluids for dehydration.  She has underlying CKD stage IV.  Chest pain has resolved and BP has improved.  She feels better and she is still stable for discharge to home.    Discharge Exam:   Vitals:   04/21/19 0416 04/21/19 0912  BP: 126/60 (!) 171/57  Pulse: 65 65  Resp:  18  Temp: 98.2 F (36.8 C) 98 F (36.7 C)  SpO2: 99% 98%   Vitals:   04/20/19 1708 04/20/19 2021 04/21/19 0416 04/21/19 0912  BP: (!) 157/67 (!) 156/78 126/60 (!) 171/57   Pulse: 87 76 65 65  Resp: 19   18  Temp:  98.5 F (36.9 C) 98.2 F (36.8 C) 98 F (36.7 C)  TempSrc:  Oral Oral Oral  SpO2: 100% 100% 99% 98%  Weight:      Height:         GEN: NAD SKIN: No rash EYES: EOMI ENT: MMM CV: RRR PULM: CTA B ABD: soft, ND, NT, +BS CNS: AAO x 3, non focal EXT: No edema or tenderness   The results of significant diagnostics from this hospitalization (including imaging, microbiology, ancillary and laboratory) are listed below for reference.     Procedures and Diagnostic Studies:   DG Chest Port 1 View  Result Date: 04/18/2019 CLINICAL DATA:  Chest pain. EXAM: PORTABLE CHEST 1 VIEW COMPARISON:  Chest radiographs 01/27/2019 and earlier FINDINGS: Unchanged mild cardiomegaly. Redemonstrated right-sided aortic arch. No evidence of airspace consolidation within the lungs. No evidence of pleural effusion or pneumothorax. No acute bony abnormality. IMPRESSION: No evidence of acute cardiopulmonary abnormality. Unchanged mild cardiomegaly. Redemonstrated right-sided aortic arch. Electronically Signed   By: Jackey Loge DO   On: 04/18/2019 08:46   US RENAL ARTERY DUPLEX COMPLETE  Result Date: 04/19/2019 CLINICAL DATA:  Labile hypertension EXAM: RENAL/URINARY TRACT ULTRASOUND RENAL DUPLEX DOPPLER ULTRASOUND COMPARISON:  None. FINDINGS: Right Kidney: Length: 8.6. Echogenicity within normal limits. No mass or hydronephrosis visualized. Left Kidney: Length: 9.6. Echogenicity within normal limits. No mass or hydronephrosis visualized. Bladder:  Normal appearance by ultrasound  RENAL DUPLEX ULTRASOUND Right Renal Artery Velocities: Origin:  144 cm/sec Mid:  91 cm/sec Hilum:  26 cm/sec Interlobar:  25 cm/sec Arcuate:  12 cm/sec Left Renal Artery Velocities: Origin:  87 cm/sec Mid:  105 cm/sec Hilum:  30 cm/sec Interlobar:  21 cm/sec Arcuate:  15 cm/sec Aortic Velocity:  60 cm/sec Right Renal-Aortic Ratios: Origin: 2.5 Mid:  1.5 Hilum: 0.4 Interlobar: 0.4 Arcuate: 0.2 Left  Renal-Aortic Ratios: Origin: 1.4 Mid: 1.7 Hilum: 0.5 Interlobar: 0.4 Arcuate: 0.3 No acute renal abnormality by ultrasound. No abnormal renal artery velocity or ratio to suggest significant renal artery stenosis by renal duplex. Bladder unremarkable. Renal veins appear patent. IMPRESSION: Negative for significant renal artery stenosis by duplex. No acute renal abnormality. Electronically Signed   By: Judie Petit.  Shick M.D.   On: 04/19/2019 12:07     Labs:   Basic Metabolic Panel: Recent Labs  Lab 04/18/19 0810 04/18/19 0810 04/20/19 0625 04/21/19 0428  NA 142  --  136 144  K 3.6   < > 4.4 4.4  CL 105  --  100 111  CO2 26  --  31 25  GLUCOSE 74  --  77 107*  BUN 14  --  27* 24*  CREATININE 1.38*  --  1.98* 1.61*  CALCIUM 8.9  --  8.7* 8.5*   < > = values in this interval not displayed.   GFR Estimated Creatinine Clearance: 36.6 mL/min (A) (by C-G formula based on SCr of 1.61 mg/dL (H)). Liver Function Tests: Recent Labs  Lab 04/18/19 0810 04/20/19 0625  AST 78* 20  ALT 51* 27  ALKPHOS 132* 98  BILITOT 0.7 0.8  PROT 7.1 5.7*  ALBUMIN 3.8 3.0*   No results for input(s): LIPASE, AMYLASE in the last 168 hours. No results for input(s): AMMONIA in the last 168 hours. Coagulation profile No results for input(s): INR, PROTIME in the last 168 hours.  CBC: Recent Labs  Lab 04/18/19 0810  WBC 8.9  HGB 13.5  HCT 40.9  MCV 81.6  PLT 182   Cardiac Enzymes: No results for input(s): CKTOTAL, CKMB, CKMBINDEX, TROPONINI in the last 168 hours. BNP: Invalid input(s): POCBNP CBG: Recent Labs  Lab 04/20/19 1315 04/20/19 1331 04/20/19 1706 04/20/19 2216 04/21/19 0421  GLUCAP 62* 82 113* 80 93   D-Dimer No results for input(s): DDIMER in the last 72 hours. Hgb A1c Recent Labs    04/19/19 0408  HGBA1C 5.1   Lipid Profile Recent Labs    04/19/19 0408  CHOL 125  HDL 34*  LDLCALC 76  TRIG 75  CHOLHDL 3.7   Thyroid function studies No results for input(s): TSH, T4TOTAL,  T3FREE, THYROIDAB in the last 72 hours.  Invalid input(s): FREET3 Anemia work up No results for input(s): VITAMINB12, FOLATE, FERRITIN, TIBC, IRON, RETICCTPCT in the last 72 hours. Microbiology Recent Results (from the past 240 hour(s))  Respiratory Panel by RT PCR (Flu A&B, Covid) - Nasopharyngeal Swab     Status: None   Collection Time: 04/18/19  9:14 AM   Specimen: Nasopharyngeal Swab  Result Value Ref Range Status   SARS Coronavirus 2 by RT PCR NEGATIVE NEGATIVE Final    Comment: (NOTE) SARS-CoV-2 target nucleic acids are NOT DETECTED. The SARS-CoV-2 RNA is generally detectable in upper respiratoy specimens during the acute phase of infection. The lowest concentration of SARS-CoV-2 viral copies this assay can detect is 131 copies/mL. A negative result does not preclude SARS-Cov-2 infection and should not be used as the sole basis  for treatment or other patient management decisions. A negative result may occur with  improper specimen collection/handling, submission of specimen other than nasopharyngeal swab, presence of viral mutation(s) within the areas targeted by this assay, and inadequate number of viral copies (<131 copies/mL). A negative result must be combined with clinical observations, patient history, and epidemiological information. The expected result is Negative. Fact Sheet for Patients:  https://www.moore.com/ Fact Sheet for Healthcare Providers:  https://www.young.biz/ This test is not yet ap proved or cleared by the [place] FDA and  has been authorized for detection and/or diagnosis of SARS-CoV-2 by FDA under an Emergency Use Authorization (EUA). This EUA will remain  in effect (meaning this test can be used) for the duration of the COVID-19 declaration under Section 564(b)(1) of the Act, 21 U.S.C. section 360bbb-3(b)(1), unless the authorization is terminated or revoked sooner.    Influenza A by PCR NEGATIVE  NEGATIVE Final   Influenza B by PCR NEGATIVE NEGATIVE Final    Comment: (NOTE) The Xpert Xpress SARS-CoV-2/FLU/RSV assay is intended as an aid in  the diagnosis of influenza from Nasopharyngeal swab specimens and  should not be used as a sole basis for treatment. Nasal washings and  aspirates are unacceptable for Xpert Xpress SARS-CoV-2/FLU/RSV  testing. Fact Sheet for Patients: https://www.moore.com/ Fact Sheet for Healthcare Providers: https://www.young.biz/ This test is not yet approved or cleared by the [place] FDA and  has been authorized for detection and/or diagnosis of SARS-CoV-2 by  FDA under an Emergency Use Authorization (EUA). This EUA will remain  in effect (meaning this test can be used) for the duration of the  Covid-19 declaration under Section 564(b)(1) of the Act, 21  U.S.C. section 360bbb-3(b)(1), unless the authorization is  terminated or revoked. Performed at [facility], [address] [place], [place] [address]   Urine Culture     Status: Abnormal   Collection Time: 04/19/19  4:52 PM   Specimen: Urine, Random  Result Value Ref Range Status   Specimen Description   Final    URINE, RANDOM Performed at [facility], [address], [place], [place] [address]    Special Requests   Final    NONE Performed at [facility], [address] [place], [place] [address]    Culture MULTIPLE SPECIES PRESENT, SUGGEST RECOLLECTION (A)  Final   Report Status 04/21/2019 FINAL  Final     Discharge Instructions:   Discharge Instructions    Diet - low sodium heart healthy   Complete by: As directed    Increase activity slowly   Complete by: As directed      Allergies as of 04/21/2019      Reactions   Lipitor [atorvastatin] Other (See Comments)   Leg pains   Tramadol Other (See Comments)   Mouth feels like it's on fire      Medication List    STOP taking these medications    metFORMIN 500 MG 24 hr tablet Commonly known as: GLUCOPHAGE-XR   promethazine 25 MG tablet Commonly known as: PHENERGAN     TAKE these medications   ALPRAZolam 1 MG tablet Commonly known as: XANAX Take 1 tablet (1 mg total) by mouth 2 (two) times daily as needed for anxiety. TAKE 1 TABLET BY MOUT   amLODipine 10 MG tablet Commonly known as: NORVASC Take 5 mg by mouth daily.   aspirin 81 MG EC tablet Take 81 mg by mouth daily.   buPROPion 300 MG 24 hr tablet Commonly known  as: WELLBUTRIN XL Take 1 tablet (300 mg total) by mouth daily.   clopidogrel 75 MG tablet Commonly known as: PLAVIX Take 1 tablet (75 mg total) by mouth daily.   diphenoxylate-atropine 2.5-0.025 MG tablet Commonly known as: LOMOTIL Take 1-4 tablets by mouth daily. Take one capsule 1-4 times daily for diarrhea   Emgality 120 MG/ML Soaj Generic drug: Galcanezumab-gnlm INJECT 240 MG SUBCUTANEOUSLY AS DIRECTED FOR THE FIRST MONTH.   Euthyrox 25 MCG tablet Generic drug: levothyroxine TAKE 1 TABLET BY MOUTH ONCE DAILY BEFORE BREAKFAST   gabapentin 300 MG capsule Commonly known as: NEURONTIN Take 1 capsule (300 mg total) by mouth 2 (two) times daily.   isosorbide mononitrate 60 MG 24 hr tablet Commonly known as: IMDUR Take 60 mg by mouth daily.   losartan 25 MG tablet Commonly known as: COZAAR Take 25 mg by mouth daily.   metoprolol succinate 25 MG 24 hr tablet Commonly known as: TOPROL-XL Take 12.5 mg by mouth daily.   nitroGLYCERIN 0.4 MG SL tablet Commonly known as: NITROSTAT Place 1 tablet (0.4 mg total) under the tongue every 5 (five) minutes as needed for chest pain.   pantoprazole 40 MG tablet Commonly known as: PROTONIX Take 1 tablet (40 mg total) by mouth 2 (two) times daily.   traZODone 100 MG tablet Commonly known as: DESYREL Take 100 mg by mouth at bedtime as needed for sleep.   venlafaxine XR 75 MG 24 hr capsule Commonly known as: EFFEXOR-XR Take 1 capsule (75 mg total) by  mouth daily with breakfast.         Time coordinating discharge: 26 minutes  Signed:  Kameelah Minish  [facility] 04/21/2019, 9:53 AM

## 2019-04-21 NOTE — Plan of Care (Signed)
  Problem: Education: Goal: Knowledge of General Education information will improve Description: Including pain rating scale, medication(s)/side effects and non-pharmacologic comfort measures Outcome: Progressing   Problem: Health Behavior/Discharge Planning: Goal: Ability to manage health-related needs will improve Outcome: Progressing   Problem: Clinical Measurements: Goal: Ability to maintain clinical measurements within normal limits will improve Outcome: Progressing Goal: Respiratory complications will improve Outcome: Progressing   Problem: Pain Managment: Goal: General experience of comfort will improve Outcome: Progressing   

## 2019-04-21 NOTE — Progress Notes (Signed)
Discharge instructions reviewed and all questions answered.

## 2019-04-21 NOTE — Care Management Important Message (Signed)
Important Message  Patient Details  Name: [name] MRN: [id number] Date of Birth: [birth year]   Medicare Important Message Given:  Yes  Initial Medicare IM given by Patient Access Associate on 04/20/2019 at 1:37pm.  Still valid.   Dannette Barbara 04/21/2019, 8:42 AM

## 2019-04-24 ENCOUNTER — Other Ambulatory Visit: Payer: Self-pay | Admitting: *Deleted

## 2019-04-24 ENCOUNTER — Telehealth: Payer: Self-pay

## 2019-04-24 MED ORDER — NITROGLYCERIN 0.4 MG SL SUBL: 0.4000 mg | SUBLINGUAL_TABLET | SUBLINGUAL | 2 refills | Status: DC | PRN

## 2019-04-24 NOTE — Telephone Encounter (Signed)
I've go all my CPE slot blocked next week for just this reason. You can unblock any of them and put patient in for her hospital follow up, preferable on Monday.  Thanks!

## 2019-04-24 NOTE — Telephone Encounter (Signed)
Patient scheduled for 3/29 at 9:00am

## 2019-04-24 NOTE — Telephone Encounter (Signed)
Transition Care Management Follow-up Telephone Call  Date of discharge and from where: [facility] on 04/21/19  How have you been since you were released from the hospital? Doing good, has been able to get around well. Pt has had some SOB with movement but will take breaks as needed. Pt has had some nausea and her appetite is decreased. Declines pain, fever or v/d.  Any questions or concerns? No   Items Reviewed:  Did the pt receive and understand the discharge instructions provided? Yes   Medications obtained and verified? Yes   Any new allergies since your discharge? No   Dietary orders reviewed? Yes  Do you have support at home? Yes   Other (ie: DME, Home Health, etc): N/A  Functional Questionnaire: (I = Independent and D = Dependent)  Bathing/Dressing- I   Meal Prep- I  Eating- I  Maintaining continence- I  Transferring/Ambulation- D, uses a cane or walker to get around.  Managing Meds- I   Follow up appointments reviewed:    PCP Hospital f/u appt confirmed? No, no available appointments with PCP. Message sent about HFU apt.  [facility] f/u appt confirmed? N/A   Are transportation arrangements needed? No   If their condition worsens, is the pt aware to call  their PCP or go to the ED? Yes  Was the patient provided with contact information for the PCP's office or ED? Yes  Was the pt encouraged to call back with questions or concerns? Yes

## 2019-04-26 ENCOUNTER — Encounter: Payer: Self-pay | Admitting: Family Medicine

## 2019-04-28 NOTE — Progress Notes (Signed)
Established patient visit      Patient: [name]   DOB: 1970-06-22   49 y.o. Female  MRN: [id number] Visit Date: 05/01/2019  Today's healthcare provider: [name], MD  Subjective:    Chief Complaint  Patient presents with  . Hospitalization Follow-up   HPI  Follow up Hospitalization  Patient was admitted to [facility] on 04/18/2019 and discharged on 04/21/2019. She was treated for chest pain, HTN, anxiety. Treatment for this included performing left heart catherization, instucting patient to follow low sodium heart healthy diet and increase activity slowly. Per discharge instruction patient was to discontinue Metformin 500mg , although patient states she had already been off of it, and Promethazine 25mg  Telephone follow up was done on 04/24/2019 She reports good compliance with treatment. She reports this condition is Improved. She has had not had any chest pain or dyspnea since discharge.  ------------------------------------------------------------------------------------     Medications: Outpatient Medications Prior to Visit  Medication Sig  . ALPRAZolam Take 1 tablet (1 mg total) by mouth 2 (two) times daily as needed for anxiety. TAKE 1 TABLET BY MOUT  . amLODipine Take 5 mg by mouth daily.   Marland Kitchen amoxicillin Take 500 mg by mouth 3 (three) times daily.  Marland Kitchen aspirin Take 81 mg by mouth daily.   Marland Kitchen buPROPion Take 1 tablet (300 mg total) by mouth daily.  . chlorhexidine   . clopidogrel Take 1 tablet (75 mg total) by mouth daily.  . diphenoxylate-atropine Take 1-4 tablets by mouth daily. Take one capsule 1-4 times daily for diarrhea  . Euthyrox TAKE 1 TABLET BY MOUTH ONCE DAILY BEFORE BREAKFAST  . gabapentin Take 1 capsule (300 mg total) by mouth 2 (two) times daily.  Verdell Face As needed  . ibuprofen   . isosorbide mononitrate Take 60 mg by mouth daily.  Marland Kitchen losartan Take 25 mg by mouth daily.  . metoprolol succinate Take 12.5 mg by mouth daily.   . nitroGLYCERIN  Place 1 tablet (0.4 mg total) under the tongue every 5 (five) minutes as needed for chest pain.  . pantoprazole Take 1 tablet (40 mg total) by mouth 2 (two) times daily.  . traZODone Take 100 mg by mouth at bedtime as needed for sleep.   Marland Kitchen venlafaxine XR Take 1 capsule (75 mg total) by mouth daily with breakfast.   No facility-administered medications prior to visit.    Review of Systems  Constitutional: Positive for appetite change and fatigue.  Respiratory: Positive for choking.   Cardiovascular: Negative.   Gastrointestinal: Positive for diarrhea (patient states that she believes is due to antibiotic she is taking).  Endocrine: Positive for heat intolerance.  Musculoskeletal: Negative.     Last CBC Lab Results  Component Value Date   WBC 8.9 04/18/2019   HGB 13.5 04/18/2019   HCT 40.9 04/18/2019   MCV 81.6 04/18/2019   MCH 26.9 04/18/2019   RDW 17.0 (H) 04/18/2019   PLT 182 52/84/1324   Last metabolic panel Lab Results  Component Value Date   GLUCOSE 107 (H) 04/21/2019   NA 144 04/21/2019   K 4.4 04/21/2019   CL 111 04/21/2019   CO2 25 04/21/2019   BUN 24 (H) 04/21/2019   CREATININE 1.61 (H) 04/21/2019   GFRNONAA 37 (L) 04/21/2019   GFRAA 43 (L) 04/21/2019   CALCIUM 8.5 (L) 04/21/2019   PHOS 3.3 06/26/2018   PROT 5.7 (L) 04/20/2019   ALBUMIN 3.0 (L) 04/20/2019   LABGLOB 2.7 11/04/2018   AGRATIO  1.4 11/04/2018   BILITOT 0.8 04/20/2019   ALKPHOS 98 04/20/2019   AST 20 04/20/2019   ALT 27 04/20/2019   ANIONGAP 8 04/21/2019   Last thyroid functions Lab Results  Component Value Date   TSH 3.500 01/20/2019        Objective:    BP (!) 142/110   Pulse (!) 104   Temp 98.2 F (36.8 C) (Oral)   Resp 16   Wt 143 lb (64.9 kg)   SpO2 98%   BMI 26.16 kg/m     Physical Exam   General: Appearance:     Well developed, well nourished female in no acute distress  Eyes:    PERRL, conjunctiva/corneas clear, EOM's intact       Lungs:     Clear to auscultation  bilaterally, respirations unlabored  Heart:    Tachycardic. Normal rhythm. No murmurs, rubs, or gallops.   MS:   All extremities are intact.   Neurologic:   Awake, alert, oriented x 3. No apparent focal neurological           defect.            Assessment & Plan:    1. Chest pain, unspecified type Resolved since hospitalization. Continue routine follow up with Dr. [name]  2. Anxiety Stable. refill- ALPRAZolam (XANAX) 1 MG tablet; Take 1 tablet (1 mg total) by mouth 2 (two) times daily as needed for anxiety. TAKE 1 TABLET BY MOUT  Dispense: 60 tablet; Refill: 1  3. Type 2 DM with CKD stage 4 and hypertension ([place]) Has follow up scheduled with Dr. Sabra Heck and Dr. Holley Raring.   4. CAD in native artery Asymptomatic. Compliant with medication.  Continue aggressive risk factor modification.    Follow up about 4 months.      [name], MD  [facility] [phone] (phone) [phone] (fax)  [place]

## 2019-05-01 ENCOUNTER — Other Ambulatory Visit: Payer: Self-pay

## 2019-05-01 ENCOUNTER — Encounter: Payer: Self-pay | Admitting: Family Medicine

## 2019-05-01 ENCOUNTER — Ambulatory Visit (INDEPENDENT_AMBULATORY_CARE_PROVIDER_SITE_OTHER): Payer: Medicare Other | Admitting: Family Medicine

## 2019-05-01 VITALS — BP 140/90 | HR 104 | Temp 98.2°F | Resp 16 | Wt 143.0 lb

## 2019-05-01 DIAGNOSIS — F419 Anxiety disorder, unspecified: Secondary | ICD-10-CM | POA: Diagnosis not present

## 2019-05-01 DIAGNOSIS — R079 Chest pain, unspecified: Secondary | ICD-10-CM | POA: Diagnosis not present

## 2019-05-01 DIAGNOSIS — N184 Chronic kidney disease, stage 4 (severe): Secondary | ICD-10-CM

## 2019-05-01 DIAGNOSIS — I2510 Atherosclerotic heart disease of native coronary artery without angina pectoris: Secondary | ICD-10-CM

## 2019-05-01 DIAGNOSIS — E1122 Type 2 diabetes mellitus with diabetic chronic kidney disease: Secondary | ICD-10-CM | POA: Diagnosis not present

## 2019-05-01 DIAGNOSIS — I129 Hypertensive chronic kidney disease with stage 1 through stage 4 chronic kidney disease, or unspecified chronic kidney disease: Secondary | ICD-10-CM | POA: Diagnosis not present

## 2019-05-01 MED ORDER — ALPRAZOLAM 1 MG PO TABS: 1.0000 mg | ORAL_TABLET | Freq: Two times a day (BID) | ORAL | 1 refills | Status: DC | PRN

## 2019-05-05 MED ORDER — ISOSORBIDE MONONITRATE ER 60 MG PO TB24: 60.0000 mg | ORAL_TABLET | Freq: Every day | ORAL | 1 refills | Status: DC

## 2019-05-11 DIAGNOSIS — N2581 Secondary hyperparathyroidism of renal origin: Secondary | ICD-10-CM | POA: Diagnosis not present

## 2019-05-11 DIAGNOSIS — D631 Anemia in chronic kidney disease: Secondary | ICD-10-CM | POA: Diagnosis not present

## 2019-05-11 DIAGNOSIS — R809 Proteinuria, unspecified: Secondary | ICD-10-CM | POA: Diagnosis not present

## 2019-05-11 DIAGNOSIS — N184 Chronic kidney disease, stage 4 (severe): Secondary | ICD-10-CM | POA: Diagnosis not present

## 2019-05-11 DIAGNOSIS — E1122 Type 2 diabetes mellitus with diabetic chronic kidney disease: Secondary | ICD-10-CM | POA: Diagnosis not present

## 2019-05-11 DIAGNOSIS — I10 Essential (primary) hypertension: Secondary | ICD-10-CM | POA: Diagnosis not present

## 2019-05-12 ENCOUNTER — Telehealth: Payer: Self-pay | Admitting: Cardiovascular Disease

## 2019-05-12 DIAGNOSIS — Z01818 Encounter for other preprocedural examination: Secondary | ICD-10-CM | POA: Diagnosis not present

## 2019-05-12 DIAGNOSIS — E113413 Type 2 diabetes mellitus with severe nonproliferative diabetic retinopathy with macular edema, bilateral: Secondary | ICD-10-CM | POA: Diagnosis not present

## 2019-05-12 DIAGNOSIS — H3500 Unspecified background retinopathy: Secondary | ICD-10-CM | POA: Insufficient documentation

## 2019-05-12 DIAGNOSIS — H25811 Combined forms of age-related cataract, right eye: Secondary | ICD-10-CM | POA: Diagnosis not present

## 2019-05-12 LAB — HM DIABETES EYE EXAM

## 2019-05-12 NOTE — Telephone Encounter (Signed)
° °  [facility] Pre-operative Risk Assessment    Request for surgical clearance:  1. What type of surgery is being performed? Cataract Extraction by PE, IOL- right then left  2. When is this surgery scheduled? 05/29/19  3. What type of clearance is required (medical clearance vs. Pharmacy clearance to hold med vs. Both)? medical  4. Are there any medications that need to be held prior to surgery and how long?  Does not need to hold any blood thinners   5. Practice name and name of physician performing surgery? [employer], outpatient at [facility], Dr Tama [address]  6. What is your office phone number [phone] ex [phone]   7.   What is your office fax number [phone]  8.   Anesthesia type (None, local, MAC, general) ? IV Sedation    [name] 05/12/2019, 3:27 PM  _________________________________________________________________   (provider comments below)

## 2019-05-12 NOTE — Telephone Encounter (Signed)
   Primary Cardiologist: Ida Rogue, MD  Chart reviewed as part of pre-operative protocol coverage. Cataract extractions are recognized in guidelines as low risk surgeries that do not typically require specific preoperative testing or holding of blood thinner therapy. Therefore, given past medical history and time since last visit, based on ACC/AHA guidelines, [place] would be at acceptable risk for the planned procedure without further cardiovascular testing.   I will route this recommendation to the requesting party via Epic fax function and remove from pre-op pool.  Please call with questions.  Harrison, PA 05/12/2019, 5:06 PM

## 2019-05-15 ENCOUNTER — Other Ambulatory Visit: Payer: Self-pay

## 2019-05-15 ENCOUNTER — Telehealth: Payer: Self-pay

## 2019-05-15 ENCOUNTER — Encounter: Payer: Self-pay | Admitting: Emergency Medicine

## 2019-05-15 ENCOUNTER — Emergency Department
Admission: EM | Admit: 2019-05-15 | Discharge: 2019-05-15 | Disposition: A | Discharge status: Home / Self Care | Payer: Medicare Other | Attending: Emergency Medicine | Admitting: Emergency Medicine

## 2019-05-15 DIAGNOSIS — X58XXXA Exposure to other specified factors, initial encounter: Secondary | ICD-10-CM | POA: Insufficient documentation

## 2019-05-15 DIAGNOSIS — N938 Other specified abnormal uterine and vaginal bleeding: Secondary | ICD-10-CM | POA: Insufficient documentation

## 2019-05-15 DIAGNOSIS — S3141XA Laceration without foreign body of vagina and vulva, initial encounter: Secondary | ICD-10-CM | POA: Diagnosis not present

## 2019-05-15 DIAGNOSIS — Y9389 Activity, other specified: Secondary | ICD-10-CM | POA: Insufficient documentation

## 2019-05-15 DIAGNOSIS — R102 Pelvic and perineal pain: Secondary | ICD-10-CM | POA: Insufficient documentation

## 2019-05-15 DIAGNOSIS — N939 Abnormal uterine and vaginal bleeding, unspecified: Secondary | ICD-10-CM

## 2019-05-15 DIAGNOSIS — Y999 Unspecified external cause status: Secondary | ICD-10-CM | POA: Insufficient documentation

## 2019-05-15 DIAGNOSIS — Y929 Unspecified place or not applicable: Secondary | ICD-10-CM | POA: Insufficient documentation

## 2019-05-15 LAB — CBC
HCT: 37.5 % (ref 36.0–46.0)
Hemoglobin: 12.2 g/dL (ref 12.0–15.0)
MCH: 27.9 pg (ref 26.0–34.0)
MCHC: 32.5 g/dL (ref 30.0–36.0)
MCV: 85.6 fL (ref 80.0–100.0)
Platelets: 204 10*3/uL (ref 150–400)
RBC: 4.38 MIL/uL (ref 3.87–5.11)
RDW: 14.7 % (ref 11.5–15.5)
WBC: 10.6 10*3/uL — ABNORMAL HIGH (ref 4.0–10.5)
nRBC: 0 % (ref 0.0–0.2)

## 2019-05-15 LAB — COMPREHENSIVE METABOLIC PANEL
ALT: 22 U/L (ref 0–44)
AST: 20 U/L (ref 15–41)
Albumin: 3.4 g/dL — ABNORMAL LOW (ref 3.5–5.0)
Alkaline Phosphatase: 116 U/L (ref 38–126)
Anion gap: 8 (ref 5–15)
BUN: 15 mg/dL (ref 6–20)
CO2: 26 mmol/L (ref 22–32)
Calcium: 8.7 mg/dL — ABNORMAL LOW (ref 8.9–10.3)
Chloride: 107 mmol/L (ref 98–111)
Creatinine, Ser: 1.56 mg/dL — ABNORMAL HIGH (ref 0.44–1.00)
GFR calc Af Amer: 44 mL/min — ABNORMAL LOW (ref >60)
GFR calc non Af Amer: 38 mL/min — ABNORMAL LOW (ref >60)
Glucose, Bld: 97 mg/dL (ref 70–99)
Potassium: 4.2 mmol/L (ref 3.5–5.1)
Sodium: 141 mmol/L (ref 135–145)
Total Bilirubin: 0.5 mg/dL (ref 0.3–1.2)
Total Protein: 6.4 g/dL — ABNORMAL LOW (ref 6.5–8.1)

## 2019-05-15 MED ORDER — MONSELS FERRIC SUBSULFATE EX SOLN
Freq: Once | CUTANEOUS | Status: AC
  Filled 2019-05-15 (×2): qty 8

## 2019-05-15 MED ORDER — HYDROCODONE-ACETAMINOPHEN 5-325 MG PO TABS
1.0000 | ORAL_TABLET | ORAL | Status: AC
  Administered 2019-05-15: 19:00:00 1 via ORAL
  Filled 2019-05-15: qty 1

## 2019-05-15 NOTE — ED Provider Notes (Signed)
[facility] EMERGENCY DEPARTMENT Provider Note   CSN: [id number] Arrival date & time: 05/15/19  1322     History Chief Complaint  Patient presents with  . Vaginal Bleeding    [name] is a 49 y.o. female presents to the emergency department for evaluation of vaginal bleeding after intercourse this morning.  Patient describes vaginal bleeding that is heavy, has had 7 pads since this morning.  She denies any chest pain, shortness of breath, dizziness or lightheadedness.  Patient has 8 out of 10 pain intravaginal, she describes sharp burning pain.  Patient states she has not had intercourse in several years and today was the first time.  She denies any foreign body.  HPI     Past Medical History:  Diagnosis Date  . Acute colitis 01/27/2017  . Acute pyelonephritis   . Anemia    iron deficiency anemia  . Aortic arch aneurysm ([place])   . BRCA negative 2014  . CAD (coronary artery disease)    a. 08/2003 Cath: LAD 30-40-med Rx; b. 11/2014 PCI: LAD 20m(3.25x23 Xience Alpine DES); c. 06/2015 PCI: D1 (2.25x12 Resolute Integrity DES); d. 06/2017 PCI: Patent mLAD stent, D2 95 (PTCA); e. 09/2017 PCI: D2 99ost (CBA); d. 12/2017 Cath: LM nl, LAD 348m80d (small), D1 40ost, D2 95ost, LCX 40p, RCA 40ost/p->Med rx for D2 given restenosis.  . Colitis 06/03/2015  . Colon polyp   . CVA (cerebral vascular accident) (HCSkokie   Left side weakness.   . Degenerative tear of glenoid labrum of right shoulder 03/15/2017  . Family history of breast cancer    BRCA neg 2014  . Femur fracture, left (HCBaldwin Park9/10/2018  . Gastric ulcer 04/27/2011  . History of echocardiogram    a. 03/2017 Echo: EF 60-65%, no rwma; b. 02/2018 Echo: EF 60-65%, no rwma. Nl RV fxn. No cardiac source of emboli (admitted w/ stroke).  . Malignant melanoma of skin of scalp (HCBeaver [place]  . MI, acute, non ST segment elevation (HCWib[place]  . Neuromuscular disorder (HCMidd[place]  . S/P drug eluting coronary stent placement 06/04/2015    . Sepsis (HCHays[place]2/14/2019  . [facility] ([facility]   a. 02/2018 MRI: 47m2mate acute/early subacute L medial frontal lobe inarct; b. 02/2018 MRA No large vessel occlusion or aneurysm. Mod to sev L P2 stenosis. thready L vertebral artery, diffusely dzs'd; c. 02/2018 Carotid U/S: <50% bilat ICA dzs.    Patient Active Problem List   Diagnosis Date Noted  . Abnormal LFTs 04/18/2019  . Esophageal dysphagia   . Gastrointestinal tract imaging abnormality   . Trochanteric bursitis, left hip 03/17/2019  . Malignant neoplasm of vulva, unspecified (HCCTemple2/06/2019  . Peripheral vascular disease, unspecified (HCCBearden2/06/2019  . Paroxysmal supraventricular tachycardia (HCCWilliams[place]2/06/2019  . Contusion of left hip 03/06/2019  . Chest pain 01/27/2019  . Melena   . Hypertensive urgency   . Recurrent chest pain 01/19/2019  . Depression, major, single episode, moderate (HCCDef[place]1/07/2018  . Closed nondisplaced intertrochanteric fracture of left femur (HCCMid[place]9/10/2018  . Hypotension 06/26/2018  . Autonomic neuropathy 03/24/2018  . Acute delirium 03/03/2018  . H/O gastric bypass 03/02/2018  . Hypertension associated with stage 3 chronic kidney disease due to type 2 diabetes mellitus (HCCLupton1/19/2020  . SI (sacroiliac) joint dysfunction 12/02/2017  . Effort angina (HCCIronton5/28/2019  . Unstable angina (HCCGuinica5/23/2019  . Syncope 04/09/2017  . Insomnia 03/18/2017  . Ischemic cardiomyopathy   . Arthritis   .  Anxiety   . Tendinitis of upper biceps tendon of right shoulder 03/16/2017  . Hx of colonic polyps   . H/O medication noncompliance 12/14/2015  . CAD in native artery   . Hypertensive heart disease   . Type 2 DM with CKD stage 4 and hypertension ([place]) 06/05/2015  . Status post bariatric surgery 06/04/2015  . Carotid stenosis 04/30/2015  . Stable angina pectoris ([place]) 04/17/2015  . Iron deficiency anemia 03/22/2015  . Vitamin B12 deficiency 02/18/2015  . Misuse of medications for pain 02/18/2015  . Major  depressive disorder, recurrent, severe with psychotic features ([place]) 02/15/2015  . Helicobacter pylori infection 11/23/2014  . Hemiparesis, left ([place]) 11/23/2014  . Malignant melanoma ([place]) 08/25/2014  . Chronic systolic CHF (congestive heart failure) ([place])   . Incomplete bladder emptying 07/12/2014  . Hypothyroidism 12/30/2013  . Aberrant subclavian artery 11/17/2013  . Multiple sclerosis ([place]) 11/02/2013  . History of CVA with residual deficit 06/20/2013  . Headache, migraine 05/29/2013  . Hyperlipidemia   . GERD (gastroesophageal reflux disease)   . Neuropathy ([place]) 01/02/2011  . Stroke ([place]) 06/21/2008  . Depression with anxiety 05/01/2008  . Essential hypertension 05/01/2008    Past Surgical History:  Procedure Laterality Date  . APPENDECTOMY    . CARDIAC CATHETERIZATION N/A 11/09/2014   Procedure: Coronary Angiography;  Surgeon: Minna Merritts, MD;  Location: [facility];  Service: Cardiovascular;  Laterality: N/A;  . CARDIAC CATHETERIZATION N/A 11/12/2014   Procedure: Coronary Stent Intervention;  Surgeon: Isaias Cowman, MD;  Location: [facility];  Service: Cardiovascular;  Laterality: N/A;  . CARDIAC CATHETERIZATION N/A 04/18/2015   Procedure: Left Heart Cath and Coronary Angiography;  Surgeon: Minna Merritts, MD;  Location: [facility];  Service: Cardiovascular;  Laterality: N/A;  . CARDIAC CATHETERIZATION Left 06/04/2015   Procedure: Left Heart Cath and Coronary Angiography;  Surgeon: Wellington Hampshire, MD;  Location: [facility] [facility] LAB;  Service: Cardiovascular;  Laterality: Left;  . CARDIAC CATHETERIZATION N/A 06/04/2015   Procedure: Coronary Stent Intervention;  Surgeon: Wellington Hampshire, MD;  Location: [facility] LAB;  Service: Cardiovascular;  Laterality: N/A;  . CESAREAN SECTION  2001  . CHOLECYSTECTOMY N/A 11/18/2016   Procedure: LAPAROSCOPIC CHOLECYSTECTOMY WITH INTRAOPERATIVE CHOLANGIOGRAM;  Surgeon: Christene Lye,  MD;  Location: [facility];  Service: General;  Laterality: N/A;  . COLONOSCOPY WITH PROPOFOL N/A 04/27/2016   Procedure: COLONOSCOPY WITH PROPOFOL;  Surgeon: [name], MD;  Location: [facility];  Service: Endoscopy;  Laterality: N/A;  . COLONOSCOPY WITH PROPOFOL N/A 01/12/2018   Procedure: COLONOSCOPY WITH PROPOFOL;  Surgeon: Toledo, Benay Pike, MD;  Location: [facility];  Service: Endoscopy;  Laterality: N/A;  . CORONARY ANGIOPLASTY    . CORONARY BALLOON ANGIOPLASTY N/A 06/29/2017   Procedure: CORONARY BALLOON ANGIOPLASTY;  Surgeon: Wellington Hampshire, MD;  Location: [facility];  Service: Cardiovascular;  Laterality: N/A;  . CORONARY BALLOON ANGIOPLASTY N/A 09/20/2017   Procedure: CORONARY BALLOON ANGIOPLASTY;  Surgeon: Wellington Hampshire, MD;  Location: [facility];  Service: Cardiovascular;  Laterality: N/A;  . DILATION AND CURETTAGE OF UTERUS    . ESOPHAGOGASTRODUODENOSCOPY (EGD) WITH PROPOFOL N/A 09/14/2014   Procedure: ESOPHAGOGASTRODUODENOSCOPY (EGD) WITH PROPOFOL;  Surgeon: Josefine Class, MD;  [facility];  Service: Endoscopy;  Laterality: N/A;  . ESOPHAGOGASTRODUODENOSCOPY (EGD) WITH PROPOFOL N/A 04/27/2016   Procedure: ESOPHAGOGASTRODUODENOSCOPY (EGD) WITH PROPOFOL;  Surgeon: [name], MD;  Location: [facility];  Service: Endoscopy;  Laterality: N/A;  Diabetic - oral meds  . ESOPHAGOGASTRODUODENOSCOPY (EGD) WITH PROPOFOL N/A 01/12/2018   Procedure: ESOPHAGOGASTRODUODENOSCOPY (EGD) WITH PROPOFOL;  Surgeon: Toledo, Benay Pike, MD;  Location: [facility];  Service: Endoscopy;  Laterality: N/A;  . ESOPHAGOGASTRODUODENOSCOPY (EGD) WITH PROPOFOL N/A 04/11/2019   Procedure: ESOPHAGOGASTRODUODENOSCOPY (EGD) WITH PROPOFOL;  Surgeon: [name], MD;  Location: [facility];  Service: Endoscopy;  Laterality: N/A;  . GASTRIC BYPASS  09/2009   [place] (IM) NAIL INTERTROCHANTERIC Left 10/13/2018    Procedure: INTRAMEDULLARY (IM) NAIL INTERTROCHANTRIC;  Surgeon: Leandrew Koyanagi, MD;  Location: [facility];  Service: Orthopedics;  Laterality: Left;  . Left Carotid to sublcavian artery bypass w/ subclavian artery ligation     a. Performed @ [facility].  . LEFT HEART CATH AND CORONARY ANGIOGRAPHY Left 06/29/2017   Procedure: LEFT HEART CATH AND CORONARY ANGIOGRAPHY;  Surgeon: Wellington Hampshire, MD;  Location: [facility];  Service: Cardiovascular;  Laterality: Left;  . LEFT HEART CATH AND CORONARY ANGIOGRAPHY N/A 09/20/2017   Procedure: LEFT HEART CATH AND CORONARY ANGIOGRAPHY;  Surgeon: Wellington Hampshire, MD;  Location: [facility] LAB;  Service: Cardiovascular;  Laterality: N/A;  . LEFT HEART CATH AND CORONARY ANGIOGRAPHY N/A 12/20/2017   Procedure: LEFT HEART CATH AND CORONARY ANGIOGRAPHY;  Surgeon: Wellington Hampshire, MD;  Location: [facility];  Service: Cardiovascular;  Laterality: N/A;  . LEFT HEART CATH AND CORONARY ANGIOGRAPHY N/A 04/20/2019   Procedure: LEFT HEART CATH AND CORONARY ANGIOGRAPHY possible PCI;  Surgeon: Minna Merritts, MD;  Location: [facility];  Service: Cardiovascular;  Laterality: N/A;  . MELANOMA EXCISION  2016   Dr. Evorn Gong  . [place]  2002  . RIGHT OOPHORECTOMY    . SHOULDER ARTHROSCOPY WITH OPEN ROTATOR CUFF REPAIR Right 01/07/2016   Procedure: SHOULDER ARTHROSCOPY WITH DEBRIDMENT, SUBACHROMIAL DECOMPRESSION;  Surgeon: Corky Mull, MD;  Location: [facility];  Service: Orthopedics;  Laterality: Right;  . SHOULDER ARTHROSCOPY WITH OPEN ROTATOR CUFF REPAIR Right 03/16/2017   Procedure: SHOULDER ARTHROSCOPY WITH OPEN ROTATOR CUFF REPAIR POSSIBLE BICEPS TENODESIS;  Surgeon: Corky Mull, MD;  Location: [facility];  Service: Orthopedics;  Laterality: Right;  . TRIGGER FINGER RELEASE Right     Middle Finger     OB History    Gravida  7   Para  1   Term      Preterm      AB      Living        SAB      TAB      Ectopic       Multiple      Live Births              Family History  Problem Relation Age of Onset  . Hypertension Mother   . Anxiety disorder Mother   . Depression Mother   . Bipolar disorder Mother   . Heart disease Mother        No details  . Hyperlipidemia Mother   . Kidney disease Father   . Heart disease Father 59  . Hypertension Father   . Diabetes Father   . Stroke Father   . Colon cancer Father        dx in his 55's  . Anxiety disorder Father   . Depression Father   . Skin cancer Father   . Kidney disease Sister   . Thyroid nodules Sister   .  Hypertension Sister   . Hypertension Sister   . Diabetes Sister   . Hyperlipidemia Sister   . Depression Sister   . Breast cancer Maternal Aunt 70  . Breast cancer Maternal Aunt 88  . Ovarian cancer Cousin   . Colon cancer Cousin   . Breast cancer Other   . Kidney cancer Neg Hx   . Bladder Cancer Neg Hx     Social History   Tobacco Use  . Smoking status: Former Smoker    Types: Cigarettes    Quit date: 08/31/1994    Years since quitting: 24.7  . Smokeless tobacco: Never Used  . Tobacco comment: quit 28 years ago  Substance Use Topics  . Alcohol use: No    Alcohol/week: 0.0 standard drinks  . Drug use: No    Home Medications Prior to Admission medications   Medication Sig Start Date End Date Taking? Authorizing Provider  ALPRAZolam Duanne Moron) 1 MG tablet Take 1 tablet (1 mg total) by mouth 2 (two) times daily as needed for anxiety. TAKE 1 TABLET BY MOUT 05/01/19   [name], MD  amLODipine (NORVASC) 10 MG tablet Take 5 mg by mouth daily.     [provider]  amoxicillin (AMOXIL) 500 MG capsule Take 500 mg by mouth 3 (three) times daily. 04/26/19   [provider]  aspirin 81 MG EC tablet Take 81 mg by mouth daily.     [provider]  buPROPion (WELLBUTRIN XL) 300 MG 24 hr tablet Take 1 tablet (300 mg total) by mouth daily. 03/15/19   [name], MD  chlorhexidine (PERIDEX) 0.12 %  solution  04/26/19   [provider]  clopidogrel (PLAVIX) 75 MG tablet Take 1 tablet (75 mg total) by mouth daily. 07/08/18   Theora Gianotti, NP  diphenoxylate-atropine (LOMOTIL) 2.5-0.025 MG tablet Take 1-4 tablets by mouth daily. Take one capsule 1-4 times daily for diarrhea 02/28/19   [name], MD  EUTHYROX 25 MCG tablet TAKE 1 TABLET BY MOUTH ONCE DAILY BEFORE BREAKFAST 04/08/19   [name], MD  gabapentin (NEURONTIN) 300 MG capsule Take 1 capsule (300 mg total) by mouth 2 (two) times daily. 10/12/18   [name], MD  Galcanezumab-gnlm [facility]) 120 MG/ML SOAJ As needed 11/11/18   [provider]  ibuprofen (ADVIL) 800 MG tablet  04/26/19   [provider]  isosorbide mononitrate (IMDUR) 60 MG 24 hr tablet Take 1 tablet (60 mg total) by mouth daily. 05/05/19   Minna Merritts, MD  losartan (COZAAR) 25 MG tablet Take 25 mg by mouth daily. 04/05/19   [provider]  metoprolol succinate (TOPROL-XL) 25 MG 24 hr tablet Take 12.5 mg by mouth daily.     [provider]  nitroGLYCERIN (NITROSTAT) 0.4 MG SL tablet Place 1 tablet (0.4 mg total) under the tongue every 5 (five) minutes as needed for chest pain. 04/24/19   Minna Merritts, MD  pantoprazole (PROTONIX) 40 MG tablet Take 1 tablet (40 mg total) by mouth 2 (two) times daily. 03/02/18   [name], MD  traZODone (DESYREL) 100 MG tablet Take 100 mg by mouth at bedtime as needed for sleep.  03/06/19   [provider]  venlafaxine XR (EFFEXOR-XR) 75 MG 24 hr capsule Take 1 capsule (75 mg total) by mouth daily with breakfast. 09/27/18   [name], MD    Allergies    Lipitor [atorvastatin] and Tramadol  Review of Systems  Review of Systems  Constitutional: Negative for fever.  Genitourinary: Positive for vaginal bleeding and vaginal pain. Negative for dysuria and vaginal discharge.  Neurological: Negative for dizziness, tremors, syncope and light-headedness.      Physical Exam Updated Vital Signs BP (!) 167/80   Pulse 83   Temp 98.4 F (36.9 C) (Oral)   Resp 16   Ht _0  (1.575 m)   Wt 64.9 kg   SpO2 100%   BMI 26.15 kg/m   Physical Exam Constitutional:      Appearance: She is well-developed.  HENT:     Head: Normocephalic and atraumatic.  Eyes:     Conjunctiva/sclera: Conjunctivae normal.  Cardiovascular:     Rate and Rhythm: Normal rate.  Pulmonary:     Effort: Pulmonary effort is normal. No respiratory distress.  Abdominal:     General: Abdomen is flat. There is no distension.     Palpations: Abdomen is soft.     Tenderness: There is no abdominal tenderness. There is no guarding.  Genitourinary:    Comments: Laceration along the posterior vaginal vault at 3:00, extending posterior to the cervix.  Bleeding present but not significant.  Patient slightly tender along the cervix.  No abnormal tissue present.  Appears to be no opening on the posterior vault behind the cervix.  Monsel's ointment used to coat laceration extending just anterior to the cervix behind the cervix. Musculoskeletal:        General: Normal range of motion.     Cervical back: Normal range of motion.  Skin:    General: Skin is warm.     Findings: No rash.  Neurological:     Mental Status: She is alert and oriented to person, place, and time.  Psychiatric:        Behavior: Behavior normal.        Thought Content: Thought content normal.     ED Results / Procedures / Treatments   Labs (all labs ordered are listed, but only abnormal results are displayed) Labs Reviewed  COMPREHENSIVE METABOLIC PANEL - Abnormal; Notable for the following components:      Result Value   Creatinine, Ser 1.56 (*)    Calcium 8.7 (*)    Total Protein 6.4 (*)    Albumin 3.4 (*)    GFR calc non Af Amer 38 (*)    GFR calc Af Amer 44 (*)    All other components within normal limits  CBC - Abnormal; Notable for the following components:   WBC 10.6 (*)    All other  components within normal limits    EKG None  Radiology No results found.  Procedures Procedures (including critical care time)  Medications Ordered in ED Medications  ferric subsulfate (MONSEL'S) solution (has no administration in time range)  HYDROcodone-acetaminophen (NORCO/VICODIN) 5-325 MG per tablet 1 tablet (has no administration in time range)    ED Course  I have reviewed the triage vital signs and the nursing notes.  Pertinent labs & imaging results that were available during my care of the patient were reviewed by me and considered in my medical decision making (see chart for details).    MDM Rules/Calculators/A&P                      49 year old female with vaginal laceration.  Hemoglobin normal.  Vital signs stable.  Patient asymptomatic.  Discussed case with GYN who recommended applying Monsel's ointment to the intravaginal laceration.  This was applied which  did seem to control the bleeding.  Patient advised to follow-up this week, she has an appointment later this week with her GYN.  She understands signs and symptoms return to ED for Final Clinical Impression(s) / ED Diagnoses Final diagnoses:  Vaginal bleeding  Vaginal laceration, initial encounter    Rx / DC Orders ED Discharge Orders    None       [name] 05/15/19 1845    Lavonia Drafts, MD 05/15/19 [phone]

## 2019-05-15 NOTE — Telephone Encounter (Signed)
Patient reports she hasn't been seen since 2018 & has an apt 05/23/19. She hasn't been sexually active until recently with a new boyfriend. She's bleeding heavy and is having clots. Inquiring if normal. Cb#[phone]

## 2019-05-15 NOTE — Telephone Encounter (Signed)
Patient states bleeding started right after intercourse this morning. She has had an ablation and hasn't had a period in 19 years. She reports filling an oversized pad in 30 minutes. Advised patient she needs to report to ED for evaluation per heavy bleeding protocol.

## 2019-05-15 NOTE — Discharge Instructions (Signed)
Please avoid sexual intercourse for 2 weeks.  Follow-up with your GYN doctor or Dr. Marcelline Mates in 1 week for recheck.  Return to the ER for any increasing abdominal pain, increasing vaginal bleeding, dizziness, lightheadedness, worsening symptoms or urgent changes in your health.

## 2019-05-15 NOTE — Telephone Encounter (Signed)
Pls call pt to f/u with bleeding sx. This is not normal and she needs to be seen and have u/s done. If u/s opening today, go ahead and schedule pt and I will call her with results until she can be seen in office. Thx.

## 2019-05-15 NOTE — Telephone Encounter (Signed)
PT called back bleeding getting worse. Please advise

## 2019-05-15 NOTE — ED Triage Notes (Signed)
Says started vaginal bleeding after sex this am.  Says it is heavy.  Says she had ablasion 19 years ago and has not had any bleeding since then.  Says pain in low abd--not cramping.

## 2019-05-16 NOTE — Telephone Encounter (Signed)
Pt did go to ER as advised. Bleeding has stopped due to procedure at hospital. Pt has follow up appt here 4/20, will keep it to follow up from ER.

## 2019-05-19 DIAGNOSIS — E113413 Type 2 diabetes mellitus with severe nonproliferative diabetic retinopathy with macular edema, bilateral: Secondary | ICD-10-CM | POA: Diagnosis not present

## 2019-05-19 DIAGNOSIS — N939 Abnormal uterine and vaginal bleeding, unspecified: Secondary | ICD-10-CM | POA: Diagnosis not present

## 2019-05-19 DIAGNOSIS — H2513 Age-related nuclear cataract, bilateral: Secondary | ICD-10-CM | POA: Diagnosis not present

## 2019-05-19 DIAGNOSIS — S3141XA Laceration without foreign body of vagina and vulva, initial encounter: Secondary | ICD-10-CM | POA: Diagnosis not present

## 2019-05-22 ENCOUNTER — Other Ambulatory Visit: Payer: Self-pay | Admitting: Family Medicine

## 2019-05-22 DIAGNOSIS — E039 Hypothyroidism, unspecified: Secondary | ICD-10-CM

## 2019-05-22 NOTE — Progress Notes (Signed)
PCP: [name], MD   Chief Complaint  Patient presents with  . Gynecologic Exam  . Vaginal Bleeding    pt wasnt sex active for 3 years, few weeks ago she finally was and started bleeding alot  . Hot Flashes    mood swings    HPI:      [name] is a 49 y.o. G7P1 who LMP was No LMP recorded. Patient has had an ablation., presents today for her MEDICARE annual examination.  Her menses are absent due to endometrial ablation.  No PMB. She does have vasomotor sx. Not candidate for HRT due to hx of CVD/CAD. Also has mood swings but already on wellbutrin, effexor and xanax.  Sex activity: single partner, contraception - none. She does have vaginal dryness and used lubricants with new sex activity last wk. Hadn't been sex active for 3 yrs. Had vaginal laceration after sex last wk and went to ED. Treated with [place]. Still having small amt of light spotting as it's healing. Hasn't been sex active since.   Had bad vaginal odor/d/c before new sex activity. Treating with feminine vag wipes with sx relief. No sx today.  Last Pap: 07/10/16 Results were: no abnormalities /neg HPV DNA.   Last mammogram: 09/23/16  Results were: normal--routine follow-up in 12 months There is a FH of breast cancer in 2 mat aunts. There is a FH of ovarian cancer in her mat niece. Pt is BRCA neg 2014. No update testing done but pt has medicare now and doesn't meet their requirements for testing. The patient does do self-breast exams. Noticed a RT breast mass about 3 months ago. No change in size since.  Colonoscopy:  2019;  Repeat due after 3 or 5 years per pt. FH colon cancer in her dad in his 3s.  Tobacco use: The patient denies current or previous tobacco use. Alcohol use: none  No drug use. Exercise: not active  She does get adequate calcium but not Vitamin D in her diet.  Labs with PCP.   Past Medical History:  Diagnosis Date  . Acute colitis 01/27/2017  . Acute pyelonephritis   .  Anemia    iron deficiency anemia  . Aortic arch aneurysm ([place])   . BRCA negative 2014  . CAD (coronary artery disease)    a. 08/2003 Cath: LAD 30-40-med Rx; b. 11/2014 PCI: LAD 19m(3.25x23 Xience Alpine DES); c. 06/2015 PCI: D1 (2.25x12 Resolute Integrity DES); d. 06/2017 PCI: Patent mLAD stent, D2 95 (PTCA); e. 09/2017 PCI: D2 99ost (CBA); d. 12/2017 Cath: LM nl, LAD 360m80d (small), D1 40ost, D2 95ost, LCX 40p, RCA 40ost/p->Med rx for D2 given restenosis.  . Colitis 06/03/2015  . Colon polyp   . CVA (cerebral vascular accident) ([facility] [place]   Left side weakness.   . Degenerative tear of glenoid labrum of right shoulder 03/15/2017  . Family history of breast cancer    BRCA neg 2014  . Femur fracture, left (HCL[place]9/10/2018  . Gastric ulcer 04/27/2011  . History of echocardiogram    a. 03/2017 Echo: EF 60-65%, no rwma; b. 02/2018 Echo: EF 60-65%, no rwma. Nl RV fxn. No cardiac source of emboli (admitted w/ stroke).  . Malignant melanoma of skin of scalp ([facility]  . MI, acute, non ST segment elevation ([facility]  . Neuromuscular disorder ([facility]  . S/P drug eluting coronary stent placement 06/04/2015  . Sepsis ([facility]2/14/2019  . [facility]  a. 02/2018 MRI: 8m late acute/early subacute L medial frontal lobe inarct; b. 02/2018 MRA No large vessel occlusion or aneurysm. Mod to sev L P2 stenosis. thready L vertebral artery, diffusely dzs'd; c. 02/2018 Carotid U/S: <50% bilat ICA dzs.    Past Surgical History:  Procedure Laterality Date  . APPENDECTOMY    . CARDIAC CATHETERIZATION N/A 11/09/2014   Procedure: Coronary Angiography;  Surgeon: TMinna Merritts MD;  [place];  Service: Cardiovascular;  Laterality: N/A;  . CARDIAC CATHETERIZATION N/A 11/12/2014   Procedure: Coronary Stent Intervention;  Surgeon: AIsaias Cowman MD;  Location: [facility];  Service: Cardiovascular;  Laterality: N/A;  . CARDIAC CATHETERIZATION N/A 04/18/2015   Procedure: Left Heart Cath and Coronary  Angiography;  Surgeon: TMinna Merritts MD;  [place] [facility];  Service: Cardiovascular;  Laterality: N/A;  . CARDIAC CATHETERIZATION Left 06/04/2015   Procedure: Left Heart Cath and Coronary Angiography;  Surgeon: MWellington Hampshire MD;  [place] [facility];  Service: Cardiovascular;  Laterality: Left;  . CARDIAC CATHETERIZATION N/A 06/04/2015   Procedure: Coronary Stent Intervention;  Surgeon: MWellington Hampshire MD;  [facility];  Service: Cardiovascular;  Laterality: N/A;  . CESAREAN SECTION  2001  . CHOLECYSTECTOMY N/A 11/18/2016   Procedure: LAPAROSCOPIC CHOLECYSTECTOMY WITH INTRAOPERATIVE CHOLANGIOGRAM;  Surgeon: SChristene Lye MD;  [facility];  Service: General;  Laterality: N/A;  . COLONOSCOPY WITH PROPOFOL N/A 04/27/2016   Procedure: COLONOSCOPY WITH PROPOFOL;  Surgeon: DLucilla Lame MD;  [place]  Service: Endoscopy;  Laterality: N/A;  . COLONOSCOPY WITH PROPOFOL N/A 01/12/2018   Procedure: COLONOSCOPY WITH PROPOFOL;  Surgeon: Toledo, TBenay Pike MD;  Location: [facility];  Service: Endoscopy;  Laterality: N/A;  . CORONARY ANGIOPLASTY    . CORONARY BALLOON ANGIOPLASTY N/A 06/29/2017   Procedure: CORONARY BALLOON ANGIOPLASTY;  Surgeon: AWellington Hampshire MD;  [place] [facility];  Service: Cardiovascular;  Laterality: N/A;  . CORONARY BALLOON ANGIOPLASTY N/A 09/20/2017   Procedure: CORONARY BALLOON ANGIOPLASTY;  Surgeon: AWellington Hampshire MD;  [place];  Service: Cardiovascular;  Laterality: N/A;  . DILATION AND CURETTAGE OF UTERUS    . ESOPHAGOGASTRODUODENOSCOPY (EGD) WITH PROPOFOL N/A 09/14/2014   Procedure: ESOPHAGOGASTRODUODENOSCOPY (EGD) WITH PROPOFOL;  Surgeon: MJosefine Class MD;  [facility];  Service: Endoscopy;  Laterality: N/A;  . ESOPHAGOGASTRODUODENOSCOPY (EGD) WITH PROPOFOL N/A 04/27/2016   Procedure: ESOPHAGOGASTRODUODENOSCOPY (EGD) WITH PROPOFOL;   Surgeon: DLucilla Lame MD;  Location: [place]  Service: Endoscopy;  Laterality: N/A;  Diabetic - oral meds  . ESOPHAGOGASTRODUODENOSCOPY (EGD) WITH PROPOFOL N/A 01/12/2018   Procedure: ESOPHAGOGASTRODUODENOSCOPY (EGD) WITH PROPOFOL;  Surgeon: Toledo, TBenay Pike MD;  [facility];  Service: Endoscopy;  Laterality: N/A;  . ESOPHAGOGASTRODUODENOSCOPY (EGD) WITH PROPOFOL N/A 04/11/2019   Procedure: ESOPHAGOGASTRODUODENOSCOPY (EGD) WITH PROPOFOL;  Surgeon: WLucilla Lame MD;  [facility];  Service: Endoscopy;  Laterality: N/A;  . GASTRIC BYPASS  09/2009   [place]) NAIL INTERTROCHANTERIC Left 10/13/2018   Procedure: INTRAMEDULLARY (IM) NAIL INTERTROCHANTRIC;  Surgeon: XLeandrew Koyanagi MD;  Location: [place]  Service: Orthopedics;  Laterality: Left;  . Left Carotid to sublcavian artery bypass w/ subclavian artery ligation     a. [place]formed @ [place]  . LEFT HEART CATH AND CORONARY ANGIOGRAPHY Left 06/29/2017   Procedure: LEFT HEART CATH AND CORONARY ANGIOGRAPHY;  Surgeon: AWellington Hampshire MD;  Location: [place] [facility] LAB;  Service: Cardiovascular;  Laterality: Left;  . LEFT HEART CATH AND CORONARY ANGIOGRAPHY N/A 09/20/2017   Procedure: LEFT HEART CATH AND CORONARY ANGIOGRAPHY;  Surgeon: Wellington Hampshire, MD;  Location: [place] [facility] LAB;  Service: Cardiovascular;  Laterality: N/A;  . LEFT HEART CATH AND CORONARY ANGIOGRAPHY N/A 12/20/2017   Procedure: LEFT HEART CATH AND CORONARY ANGIOGRAPHY;  Surgeon: Wellington Hampshire, MD;  Location: [place] CV LAB;  Service: Cardiovascular;  Laterality: N/A;  . LEFT HEART CATH AND CORONARY ANGIOGRAPHY N/A 04/20/2019   Procedure: LEFT HEART CATH AND CORONARY ANGIOGRAPHY possible PCI;  Surgeon: Minna Merritts, MD;  Location: [place] [facility] LAB;  Service: Cardiovascular;  Laterality: N/A;  . MELANOMA EXCISION  2016   Dr. Evorn Gong  . [place]  2002  . RIGHT OOPHORECTOMY     . SHOULDER ARTHROSCOPY WITH OPEN ROTATOR CUFF REPAIR Right 01/07/2016   Procedure: SHOULDER ARTHROSCOPY WITH DEBRIDMENT, SUBACHROMIAL DECOMPRESSION;  Surgeon: Corky Mull, MD;  Location[facility];  Service: Orthopedics;  Laterality: Right;  . SHOULDER ARTHROSCOPY WITH OPEN ROTATOR CUFF REPAIR Right 03/16/2017   Procedure: SHOULDER ARTHROSCOPY WITH OPEN ROTATOR CUFF REPAIR POSSIBLE BICEPS TENODESIS;  Surgeon: Corky Mull, MD;  [facility]: [facility];  Service: Orthopedics;  Laterality: Right;  . TRIGGER FINGER RELEASE Right     [place]    Family History  Problem Relation Age of Onset  . Hypertension Mother   . Anxiety disorder Mother   . Depression Mother   . Bipolar disorder Mother   . Heart disease Mother        No details  . Hyperlipidemia Mother   . Kidney disease Father   . Heart disease Father 64  . Hypertension Father   . Diabetes Father   . Stroke Father   . Colon cancer Father        dx in his 35's  . Anxiety disorder Father   . Depression Father   . Skin cancer Father   . Kidney disease Sister   . Thyroid nodules Sister   . Hypertension Sister   . Hypertension Sister   . Diabetes Sister   . Hyperlipidemia Sister   . Depression Sister   . Breast cancer Maternal Aunt 19  . Breast cancer Maternal Aunt 54  . Ovarian cancer Cousin   . Colon cancer Cousin   . Kidney cancer Cousin   . Breast cancer Other   . Bladder Cancer Neg Hx     Social History   Socioeconomic History  . Marital status: Divorced    Spouse name: Not on file  . Number of children: 1  . Years of education: Not on file  . Highest education level: Not on file  Occupational History  . 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 Disabled through 08/02/2013 per forms 02/03/2013  Tobacco Use  . Smoking status: Former Smoker    Types: Cigarettes    Quit date: 08/31/1994    Years since quitting: 24.7  .  Smokeless tobacco: Never Used  . Tobacco comment: quit 28 years ago  Substance and Sexual Activity  . Alcohol use: No    Alcohol/week: 0.0 standard drinks  . Drug use: No  . Sexual activity: Not Currently    Birth control/protection: None, Surgical    Comment: Ablation  Other Topics Concern  . 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   Lives at home with son   Social Determinants of Health   Financial Resource Strain: Low Risk   . Difficulty of Paying Living Expenses: Not hard at all  Food Insecurity: No Food Insecurity  . Worried About Charity fundraiser in the Last Year: Never true  . Ran Out of Food in the Last Year: Never true  Transportation Needs: No Transportation Needs  . Lack of Transportation (Medical): No  . Lack of Transportation (Non-Medical): No  Physical Activity: Inactive  . Days of Exercise per Week: 0 days  . Minutes of Exercise per Session: 0 min  Stress: Stress Concern Present  . Feeling of Stress : Very much  Social Connections: Somewhat Isolated  . Frequency of Communication with Friends and Family: More than three times a week  . Frequency of Social Gatherings with Friends and Family: Once a week  . Attends Religious Services: More than 4 times per year  . Active Member of Clubs or Organizations: No  . Attends Archivist Meetings: Not asked  . Marital Status: Divorced  Human resources officer Violence: Not At Risk  . Fear of Current or Ex-Partner: No  . Emotionally Abused: No  . Physically Abused: No  . Sexually Abused: No     Current Outpatient Medications:  .  ALPRAZolam (XANAX) 1 MG tablet, Take 1 tablet (1 mg total) by mouth 2 (two) times daily as needed for anxiety. TAKE 1 TABLET BY MOUT, Disp: 60 tablet, Rfl: 1 .  amLODipine (NORVASC) 10 MG tablet, Take 5 mg by mouth daily. , Disp: , Rfl:  .  amoxicillin (AMOXIL) 500 MG capsule, Take 500 mg  by mouth 3 (three) times daily., Disp: , Rfl:  .  aspirin 81 MG EC tablet, Take 81 mg by mouth daily. , Disp: , Rfl:  .  buPROPion (WELLBUTRIN XL) 300 MG 24 hr tablet, Take 1 tablet (300 mg total) by mouth daily., Disp: 90 tablet, Rfl: 1 .  chlorhexidine (PERIDEX) 0.12 % solution, , Disp: , Rfl:  .  diphenoxylate-atropine (LOMOTIL) 2.5-0.025 MG tablet, Take 1-4 tablets by mouth daily. Take one capsule 1-4 times daily for diarrhea, Disp: 30 tablet, Rfl: 5 .  EUTHYROX 25 MCG tablet, TAKE 1 TABLET BY MOUTH ONCE DAILY BEFORE BREAKFAST, Disp: 30 tablet, Rfl: 0 .  gabapentin (NEURONTIN) 300 MG capsule, Take 1 capsule (300 mg total) by mouth 2 (two) times daily., Disp: 180 capsule, Rfl: 4 .  Galcanezumab-gnlm (EMGALITY) 120 MG/ML SOAJ, As needed, Disp: , Rfl:  .  isosorbide mononitrate (IMDUR) 60 MG 24 hr tablet, Take 1 tablet (60 mg total) by mouth daily., Disp: 90 tablet, Rfl: 1 .  losartan (COZAAR) 25 MG tablet, Take 25 mg by mouth daily., Disp: , Rfl:  .  metFORMIN (GLUCOPHAGE-XR) 500 MG 24 hr tablet, Take 500 mg by mouth every morning., Disp: , Rfl:  .  nitroGLYCERIN (NITROSTAT) 0.4 MG SL tablet, Place 1 tablet (0.4 mg total) under the tongue every 5 (five) minutes as needed for chest pain., Disp: 25 tablet, Rfl: 2 .  pantoprazole (PROTONIX) 40 MG tablet, Take 1 tablet (40 mg total) by mouth 2 (two) times daily., Disp: 60 tablet, Rfl: 5 .  traZODone (DESYREL) 100 MG tablet, Take 100 mg by mouth at bedtime as needed for sleep. , Disp: , Rfl:  .  venlafaxine XR (EFFEXOR-XR) 75 MG 24 hr  capsule, Take 1 capsule (75 mg total) by mouth daily with breakfast., Disp: 30 capsule, Rfl: 5     ROS:  Review of Systems  Constitutional: Positive for fatigue. Negative for fever and unexpected weight change.  Respiratory: Negative for cough, shortness of breath and wheezing.   Cardiovascular: Negative for chest pain, palpitations and leg swelling.  Gastrointestinal: Negative for blood in stool, constipation,  diarrhea, nausea and vomiting.  Endocrine: Negative for cold intolerance, heat intolerance and polyuria.  Genitourinary: Positive for vaginal bleeding and vaginal discharge. Negative for dyspareunia, dysuria, flank pain, frequency, genital sores, hematuria, menstrual problem, pelvic pain, urgency and vaginal pain.  Musculoskeletal: Negative for back pain, joint swelling and myalgias.  Skin: Negative for rash.  Neurological: Negative for dizziness, syncope, light-headedness, numbness and headaches.  Hematological: Negative for adenopathy.  Psychiatric/Behavioral: Positive for agitation and dysphoric mood. Negative for confusion, sleep disturbance and suicidal ideas. The patient is not nervous/anxious.   BREAST: mass    Objective: BP 120/90   Ht '5\' 2"'  (1.575 m)   Wt 146 lb (66.2 kg)   BMI 26.70 kg/m    Physical Exam Constitutional:      Appearance: She is well-developed.  Genitourinary:     Vulva, vagina, cervix, uterus, right adnexa and left adnexa normal.     No vulval lesion or tenderness noted.     No vaginal discharge, erythema or tenderness.     No cervical polyp.     Uterus is not enlarged or tender.     No right or left adnexal mass present.     Right adnexa not tender.     Left adnexa not tender.     Genitourinary Comments: TRACE REMNANTS OF BLEEDING; NO LACERATION VISIBLE ON EXAM  Neck:     Thyroid: No thyromegaly.  Cardiovascular:     Rate and Rhythm: Normal rate and regular rhythm.     Heart sounds: Normal heart sounds. No murmur.  Pulmonary:     Effort: Pulmonary effort is normal.     Breath sounds: Normal breath sounds.  Chest:     Breasts:        Right: Mass present. No nipple discharge, skin change or tenderness.        Left: Mass present. No nipple discharge, skin change or tenderness.       Comments: RT BREAST 11:00 POS WITH 2 CM FIRM, MOBILE, NT MASS LT BREAST 2:00 POS WITH 3 CM FIRM AREA; QUESTION PROMINENT TISSUE VS MASS Abdominal:     Palpations:  Abdomen is soft.     Tenderness: There is no abdominal tenderness. There is no guarding.  Musculoskeletal:        General: Normal range of motion.     Cervical back: Normal range of motion.  Neurological:     General: No focal deficit present.     Mental Status: She is alert and oriented to person, place, and time.     Cranial Nerves: No cranial nerve deficit.  Skin:    General: Skin is warm and dry.  Psychiatric:        Mood and Affect: Mood normal.        Behavior: Behavior normal.        Thought Content: Thought content normal.        Judgment: Judgment normal.  Vitals reviewed.     Assessment/Plan:  Encounter for annual routine gynecological examination  Cervical cancer screening - Plan: Cytology - PAP  Encounter for screening mammogram for malignant neoplasm  of breast - Plan: US BREAST LTD UNI RIGHT INC AXILLA, US BREAST LTD UNI LEFT INC AXILLA, MM 3D SCREEN BREAST BILATERAL, Bilat breast masses. Check dx mammo and u/s. Would be strange to have mirror-image masses but RT breast mass is definitely discrete and new to pt.  Left breast mass - Plan: US BREAST LTD UNI LEFT INC AXILLA  Breast mass, right - Plan: US BREAST LTD UNI RIGHT INC AXILLA  Family history of breast cancer--BRCA neg in 2014. Doesn't meet medicare testing guidelines for update testing.  Family history of colon cancer--current on colonoscopy.  Traumatic vaginal laceration, sequela--healing. No sex activity for a wks after oozing/bleeding stops. Use coconut oil as lubrication. F/u prn.           GYN counsel breast self exam, mammography screening, menopause, adequate intake of calcium and vitamin D, diet and exercise    F/U  Return in about 2 years (around 05/22/2021)./prn sx  Rannie Craney B. Allisen Pidgeon, PA-C 05/23/2019 10:11 AM

## 2019-05-22 NOTE — Telephone Encounter (Signed)
Requested Prescriptions  Pending Prescriptions Disp Refills  . EUTHYROX 25 MCG tablet [Pharmacy Med Name: Euthyrox 25 MCG Oral Tablet] 30 tablet 0    Sig: TAKE 1 TABLET BY MOUTH ONCE DAILY BEFORE BREAKFAST     Endocrinology:  Hypothyroid Agents Failed - 05/22/2019  8:06 AM      Failed - TSH needs to be rechecked within 3 months after an abnormal result. Refill until TSH is due.      Passed - TSH in normal range and within 360 days    TSH  Date Value Ref Range Status  01/20/2019 3.500 0.350 - 4.500 uIU/mL Final    Comment:    Performed by a 3rd Generation assay with a functional sensitivity of <=0.01 uIU/mL. Performed at [facility], [address] [place], [place] [address]   12/08/2016 1.53 mIU/L Final    Comment:              Reference Range .           > or = 20 Years  0.40-4.50 .                Pregnancy Ranges           First trimester    0.26-2.66           Second trimester   0.55-2.73           Third trimester    0.43-2.91          Passed - Valid encounter within last 12 months    Recent Outpatient Visits          3 weeks ago Chest pain, unspecified type   [facility] [name], MD   2 months ago [place], Donald E, MD   3 months ago Chest pain, unspecified type   [facility] [name], MD   3 months ago Recurrent UTI   [place], [place], PA-C   5 months ago Type 2 diabetes mellitus with stage 3a chronic kidney disease, without long-term current use of insulin [facility])   [facility] [name], MD      Future Appointments            In 2 weeks Fisher, Kirstie Peri, MD [facility], [facility]   In 3 months  [facility], [place]   In 3 months Fisher, Kirstie Peri, MD [facility], [place]   In 4 months [place], Kathlene November, MD [facility], [place]

## 2019-05-23 ENCOUNTER — Other Ambulatory Visit: Payer: Self-pay | Admitting: Obstetrics and Gynecology

## 2019-05-23 ENCOUNTER — Ambulatory Visit (INDEPENDENT_AMBULATORY_CARE_PROVIDER_SITE_OTHER): Payer: Medicare Other | Admitting: Obstetrics and Gynecology

## 2019-05-23 ENCOUNTER — Encounter: Payer: Self-pay | Admitting: Obstetrics and Gynecology

## 2019-05-23 ENCOUNTER — Other Ambulatory Visit (HOSPITAL_COMMUNITY)
Admission: RE | Admit: 2019-05-23 | Discharge: 2019-05-23 | Disposition: A | Discharge status: Home / Self Care | Payer: Medicare Other | Source: Ambulatory Visit | Attending: Obstetrics and Gynecology | Admitting: Obstetrics and Gynecology

## 2019-05-23 ENCOUNTER — Other Ambulatory Visit: Payer: Self-pay

## 2019-05-23 VITALS — BP 120/90 | Ht 62.0 in | Wt 146.0 lb

## 2019-05-23 DIAGNOSIS — N6310 Unspecified lump in the right breast, unspecified quadrant: Secondary | ICD-10-CM

## 2019-05-23 DIAGNOSIS — Z803 Family history of malignant neoplasm of breast: Secondary | ICD-10-CM | POA: Insufficient documentation

## 2019-05-23 DIAGNOSIS — S3141XS Laceration without foreign body of vagina and vulva, sequela: Secondary | ICD-10-CM

## 2019-05-23 DIAGNOSIS — Z1151 Encounter for screening for human papillomavirus (HPV): Secondary | ICD-10-CM | POA: Diagnosis not present

## 2019-05-23 DIAGNOSIS — R87610 Atypical squamous cells of undetermined significance on cytologic smear of cervix (ASC-US): Secondary | ICD-10-CM | POA: Insufficient documentation

## 2019-05-23 DIAGNOSIS — N6320 Unspecified lump in the left breast, unspecified quadrant: Secondary | ICD-10-CM

## 2019-05-23 DIAGNOSIS — Z124 Encounter for screening for malignant neoplasm of cervix: Secondary | ICD-10-CM | POA: Insufficient documentation

## 2019-05-23 DIAGNOSIS — Z800 Family history of malignant neoplasm of digestive organs: Secondary | ICD-10-CM | POA: Insufficient documentation

## 2019-05-23 DIAGNOSIS — Z01419 Encounter for gynecological examination (general) (routine) without abnormal findings: Secondary | ICD-10-CM | POA: Diagnosis not present

## 2019-05-23 DIAGNOSIS — Z1231 Encounter for screening mammogram for malignant neoplasm of breast: Secondary | ICD-10-CM

## 2019-05-23 NOTE — Patient Instructions (Addendum)
I value your feedback and entrusting us with your care. If you get a [place] patient survey, I would appreciate you taking the time to let us know about your experience today. Thank you!  As of January 12, 2019, your lab results will be released to your MyChart immediately, before I even have a chance to see them. Please give me time to review them and contact you if there are any abnormalities. Thank you for your patience.  

## 2019-05-24 ENCOUNTER — Other Ambulatory Visit: Payer: Self-pay | Admitting: Obstetrics and Gynecology

## 2019-05-24 DIAGNOSIS — Z1231 Encounter for screening mammogram for malignant neoplasm of breast: Secondary | ICD-10-CM

## 2019-05-24 DIAGNOSIS — N63 Unspecified lump in unspecified breast: Secondary | ICD-10-CM

## 2019-05-25 ENCOUNTER — Encounter: Payer: Self-pay | Admitting: Obstetrics and Gynecology

## 2019-05-25 LAB — CYTOLOGY - PAP
Comment: NEGATIVE
Diagnosis: UNDETERMINED — AB
High risk HPV: NEGATIVE

## 2019-05-26 IMAGING — CR DG HIP (WITH OR WITHOUT PELVIS) 2-3V*L*
3 series · 3 of 3 positions shown · non-contrast
Comparison: None.

CLINICAL DATA: Recent fall with left hip pain, initial encounter

EXAM:
DG HIP (WITH OR WITHOUT PELVIS) 2-3V LEFT

[pelvis ap]
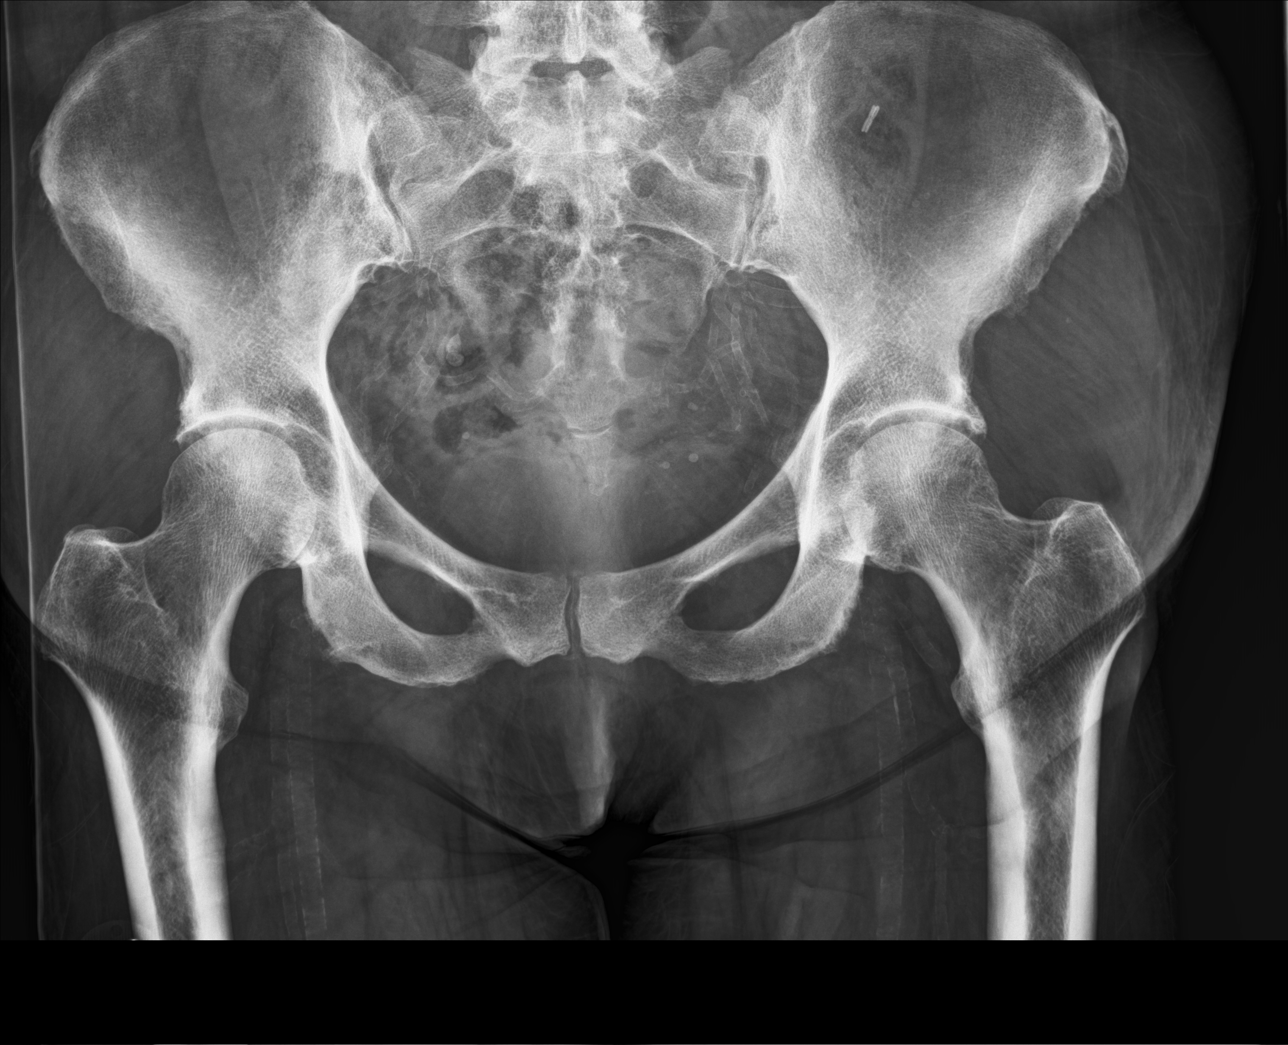

[hip ap]
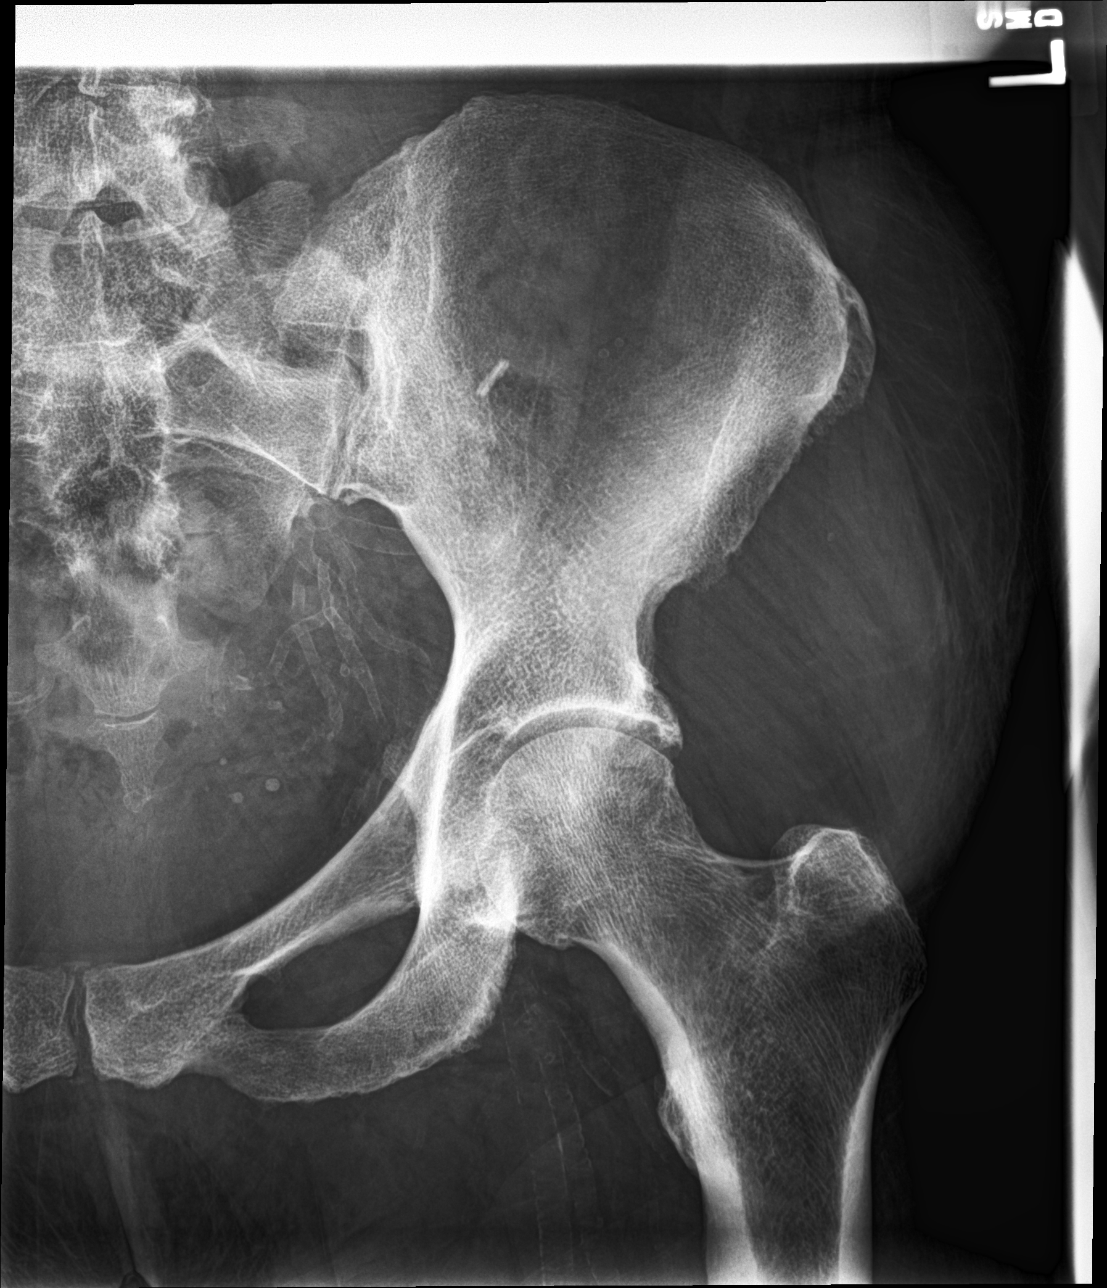

[hip lat]
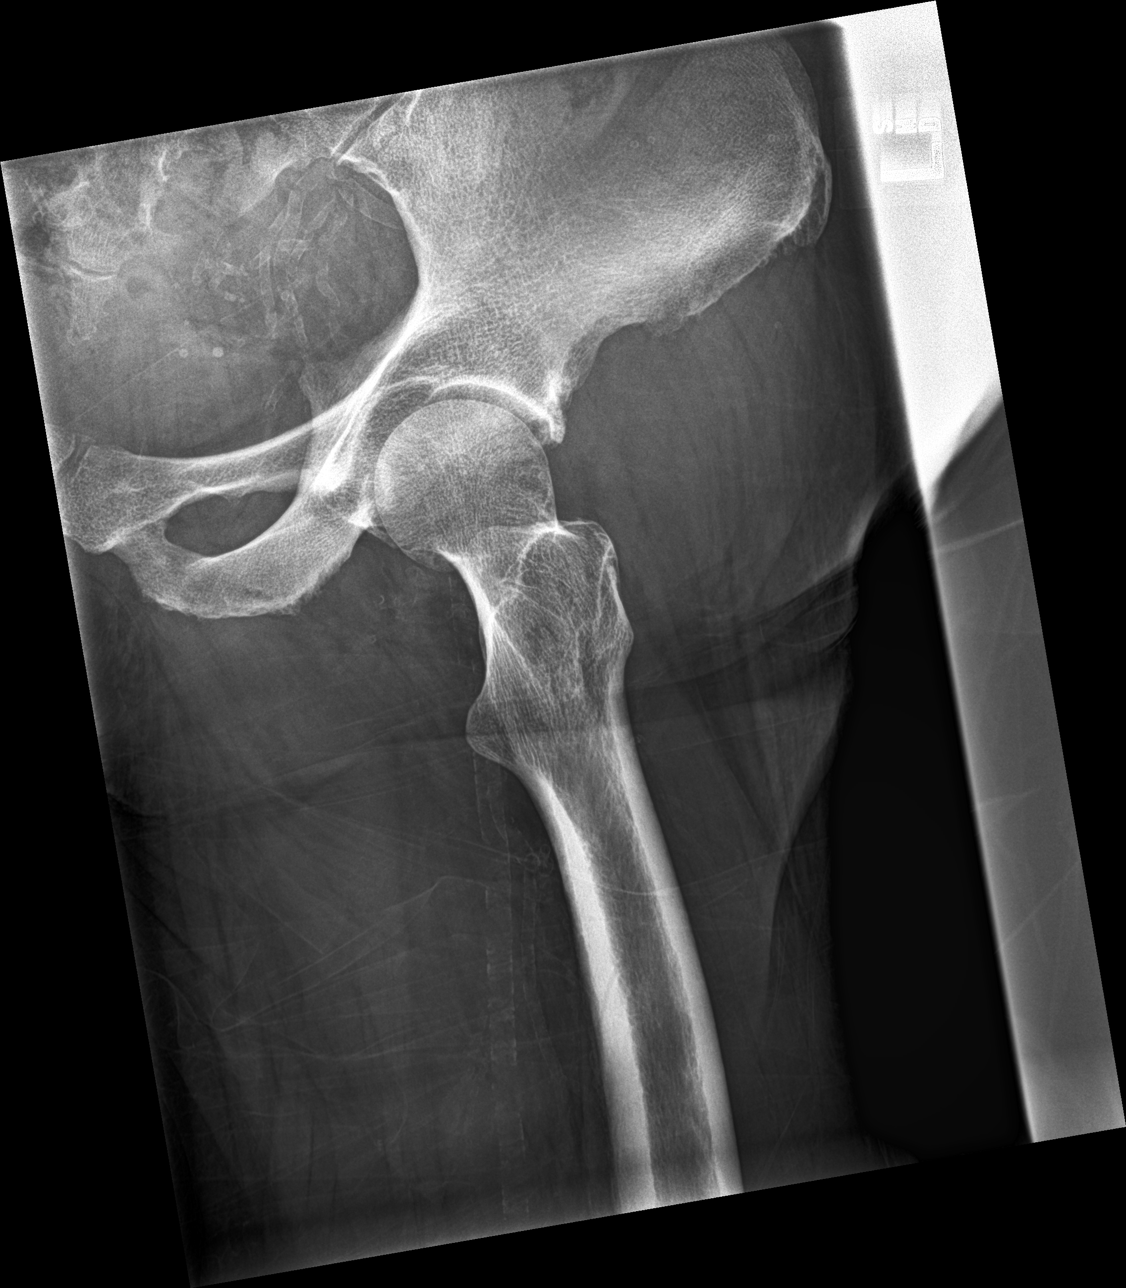

[3 of 3 positions shown; findings below may reference images not displayed]

FINDINGS: Pelvic ring is intact. Mild degenerative changes of the hip joints
are noted bilaterally. No acute fracture or dislocation is seen. No
soft tissue changes are noted.
IMPRESSION: Mild degenerative change without acute abnormality.

## 2019-05-26 IMAGING — CR DG SHOULDER 2+V*L*
3 series · 3 of 3 positions shown · non-contrast
Comparison: None.

CLINICAL DATA: Recent fall with left shoulder pain, initial
encounter

EXAM:
LEFT SHOULDER - 2+ VIEW

[shoulder grashey]
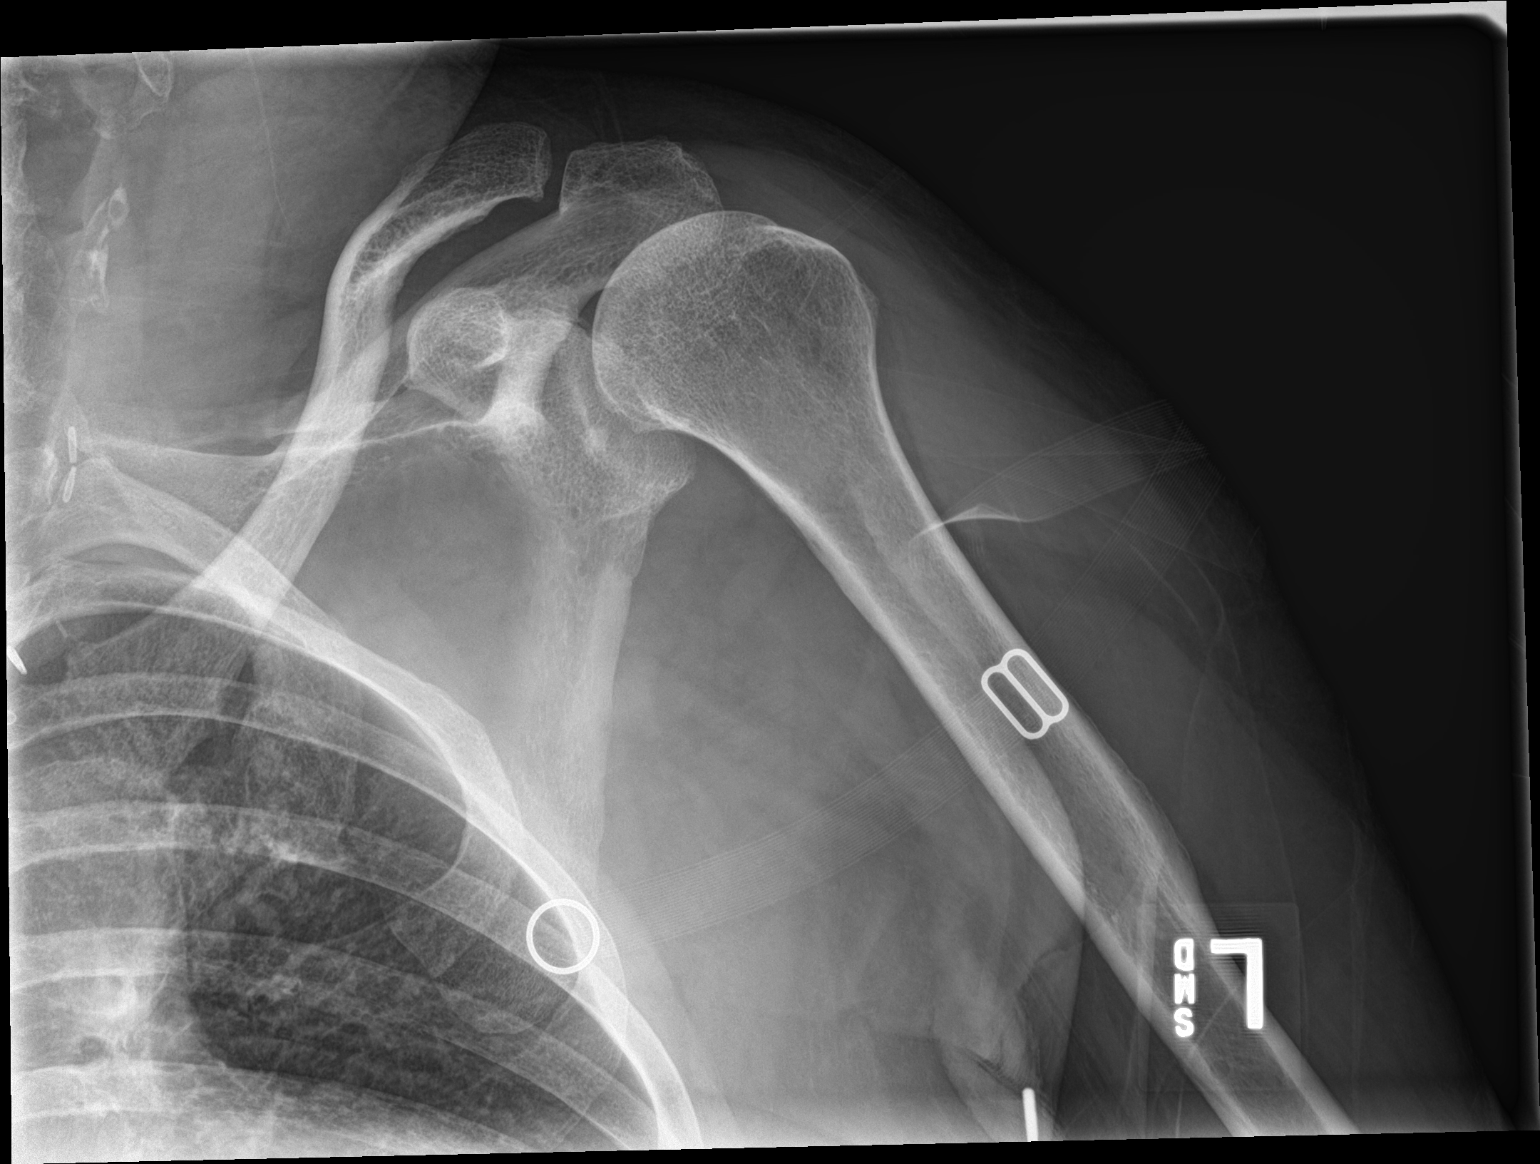

[shoulder y view]
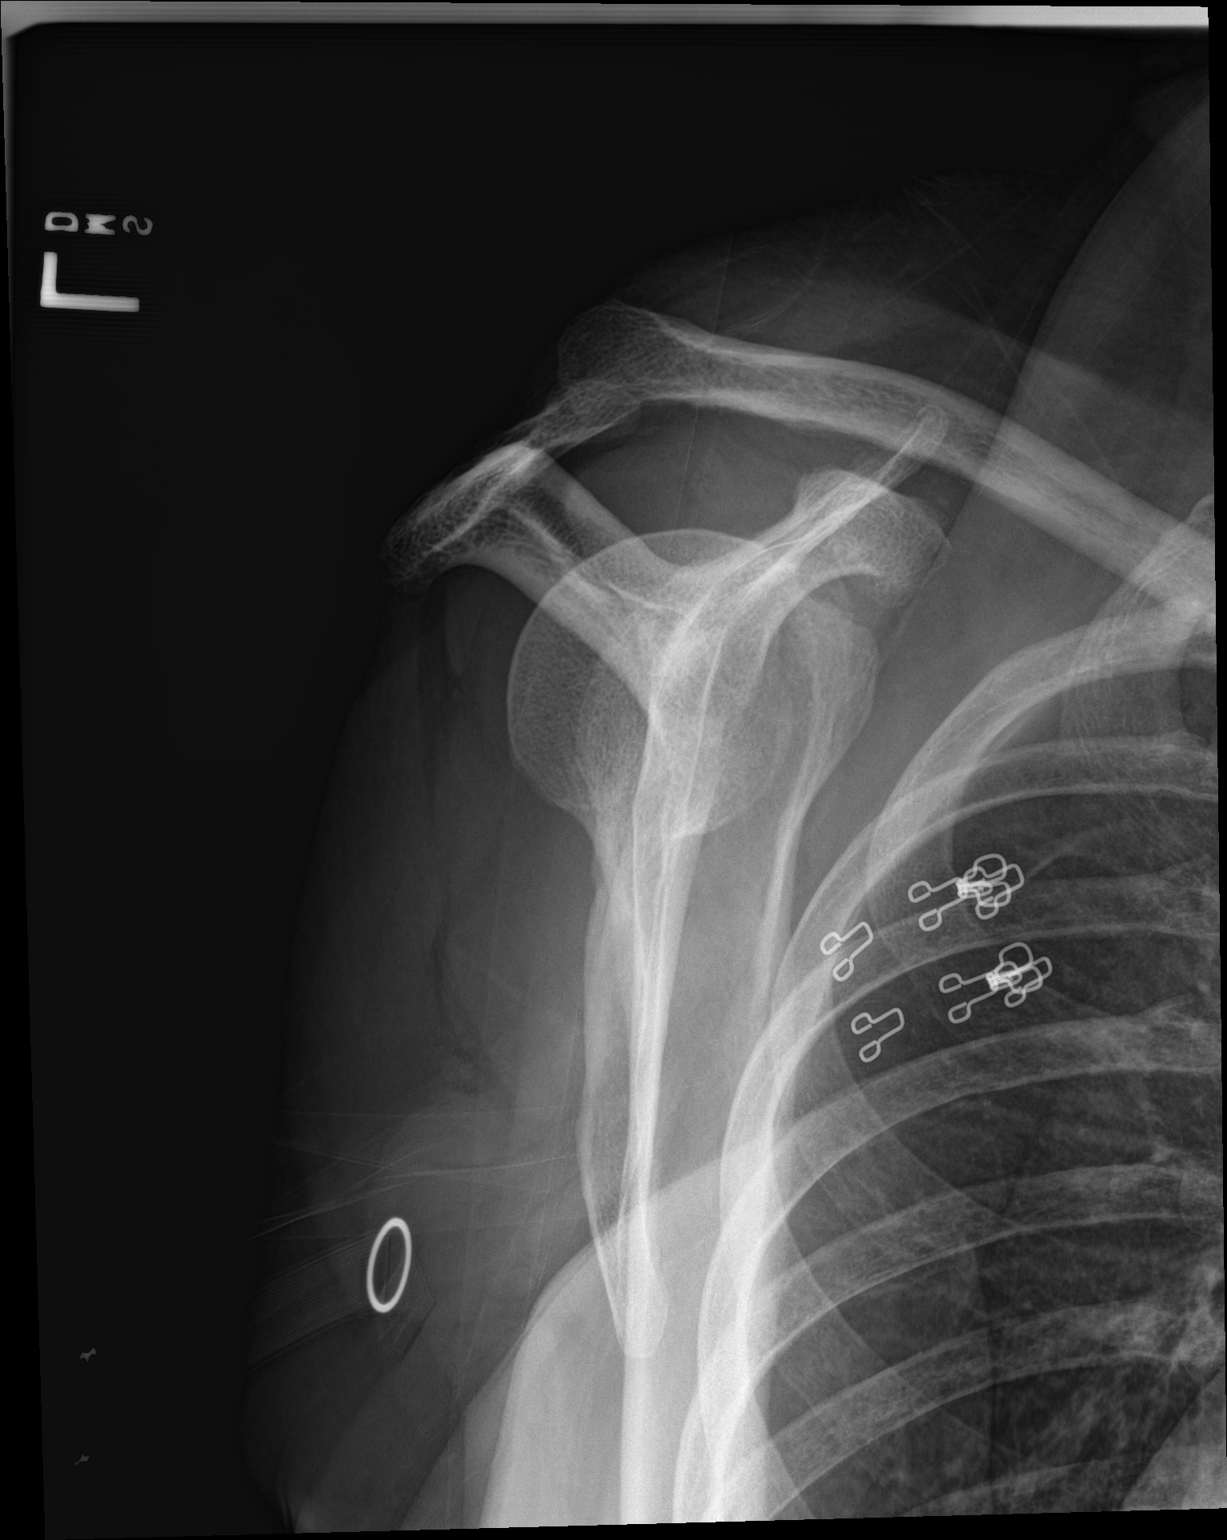

[shoulder axial]
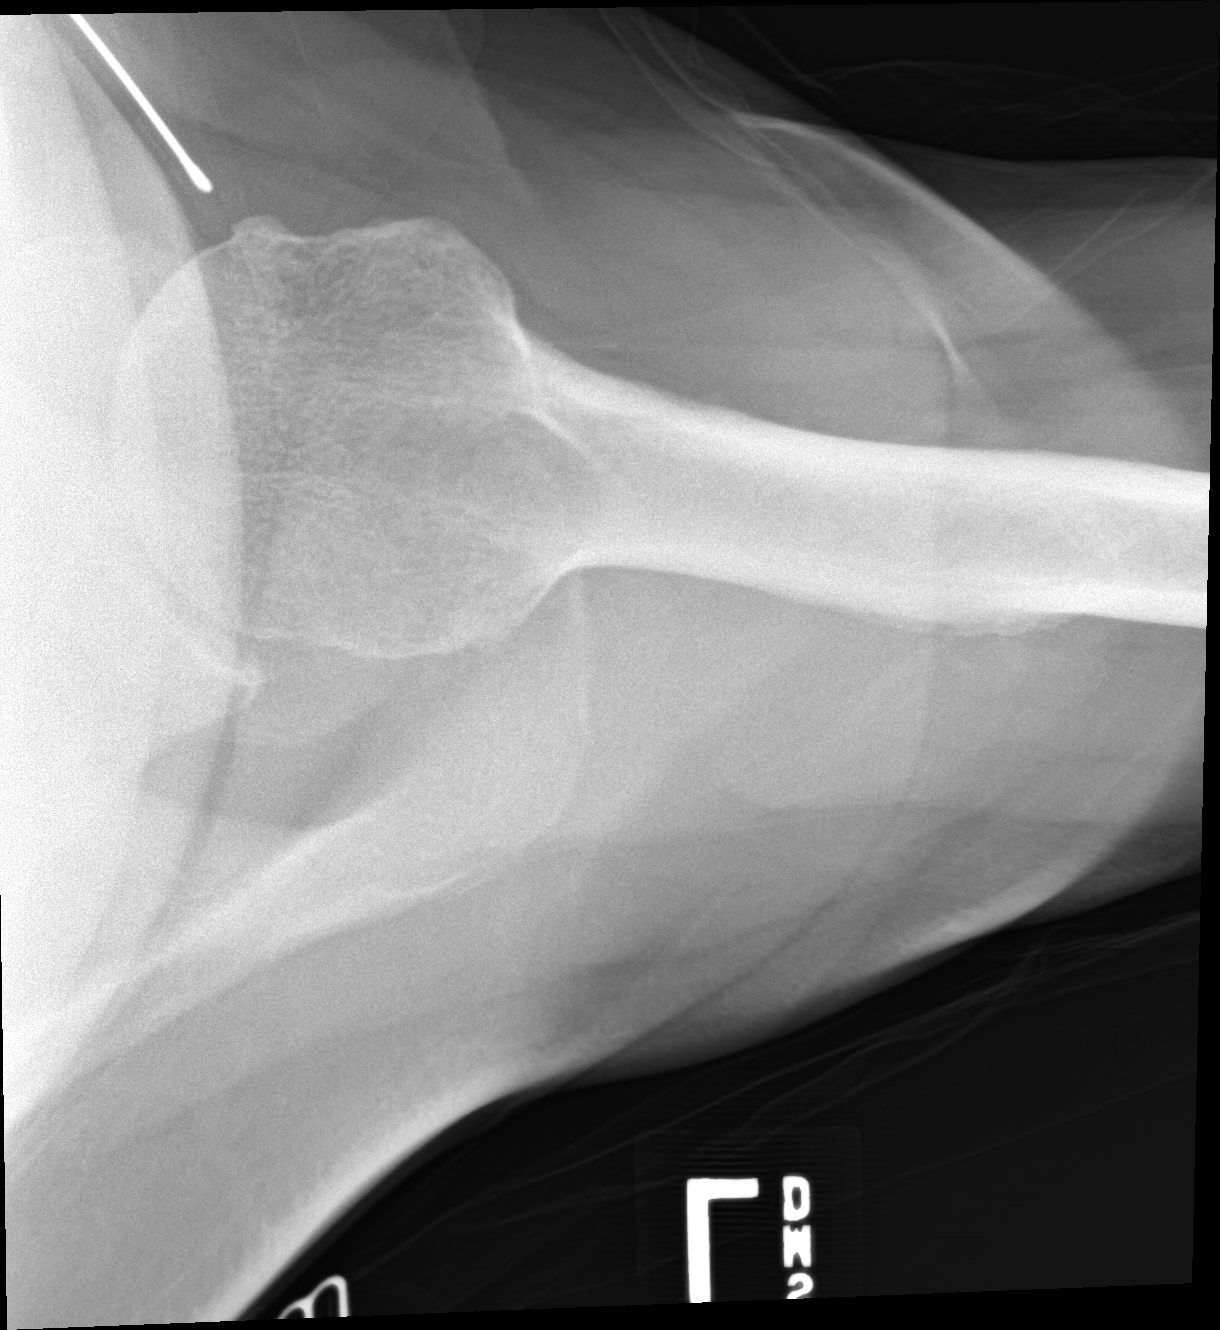

[3 of 3 positions shown; findings below may reference images not displayed]

FINDINGS: There is no evidence of fracture or dislocation. There is no
evidence of arthropathy or other focal bone abnormality. Soft
tissues are unremarkable.
IMPRESSION: No acute abnormality noted.

## 2019-05-26 IMAGING — CR DG FEMUR 2+V*L*
4 series · 4 of 4 positions shown · non-contrast
Comparison: None.

CLINICAL DATA: Recent fall with left leg pain, initial encounter

EXAM:
LEFT FEMUR 2 VIEWS

[femur ap (1 of 2)]
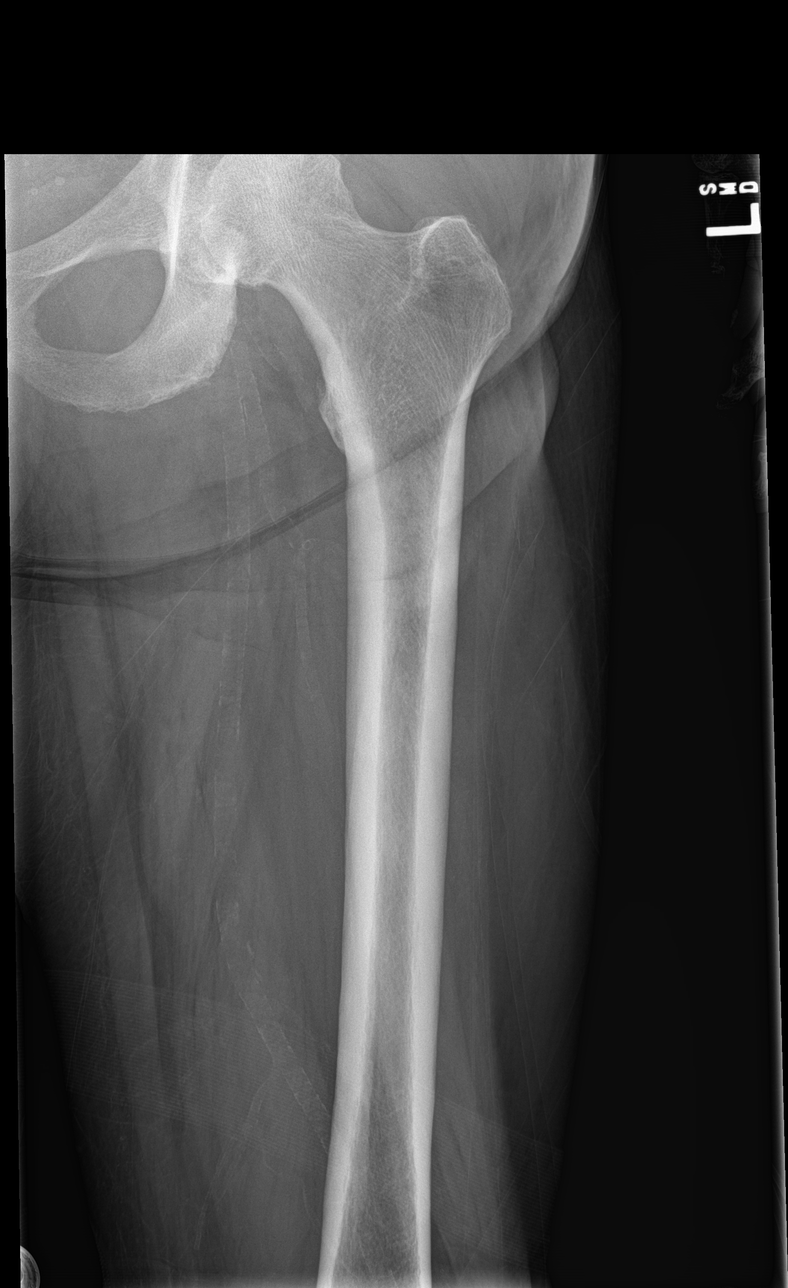

[femur ap (2 of 2)]
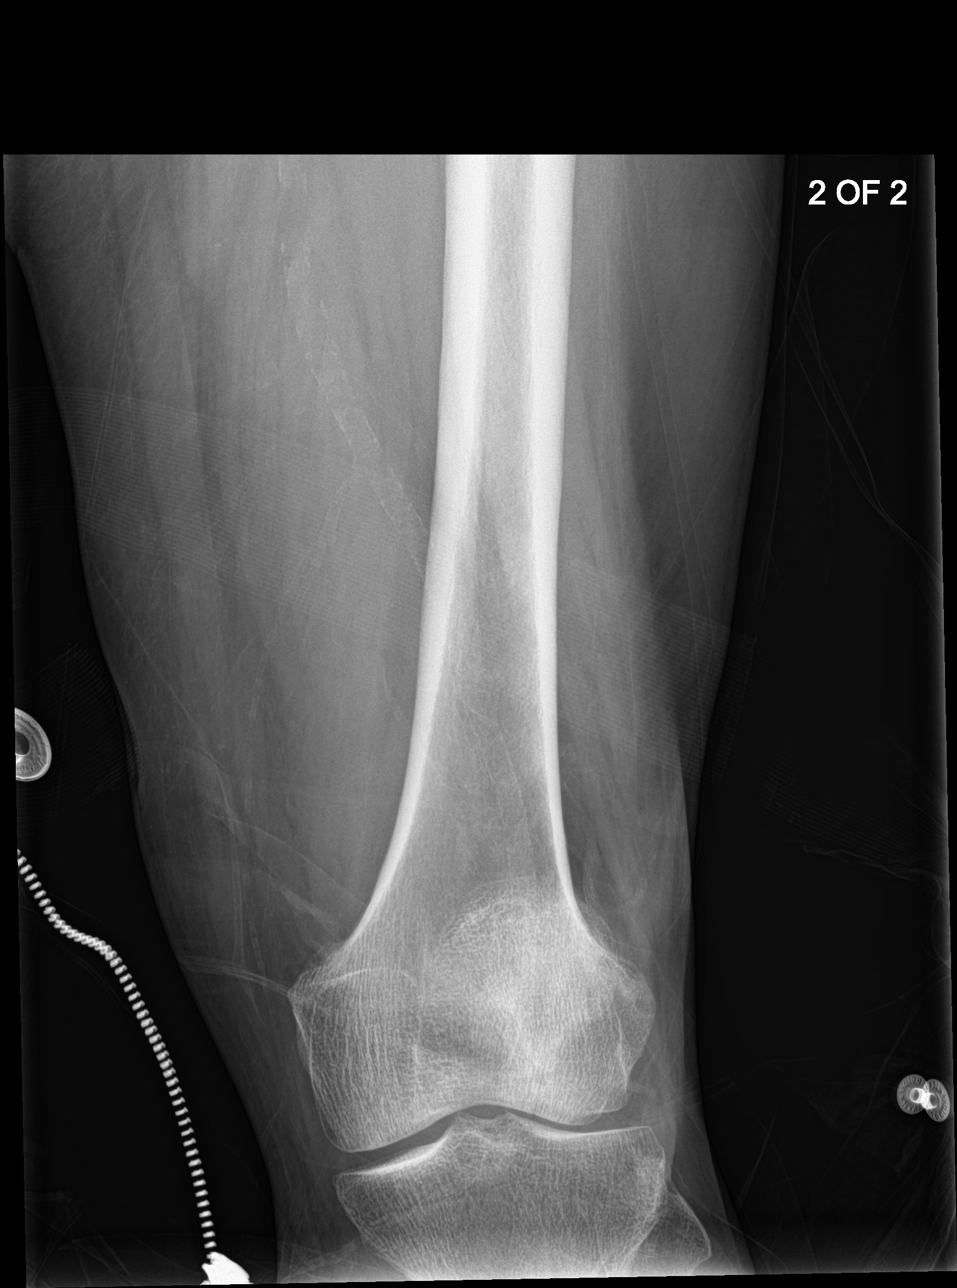

[femur lat (1 of 2)]
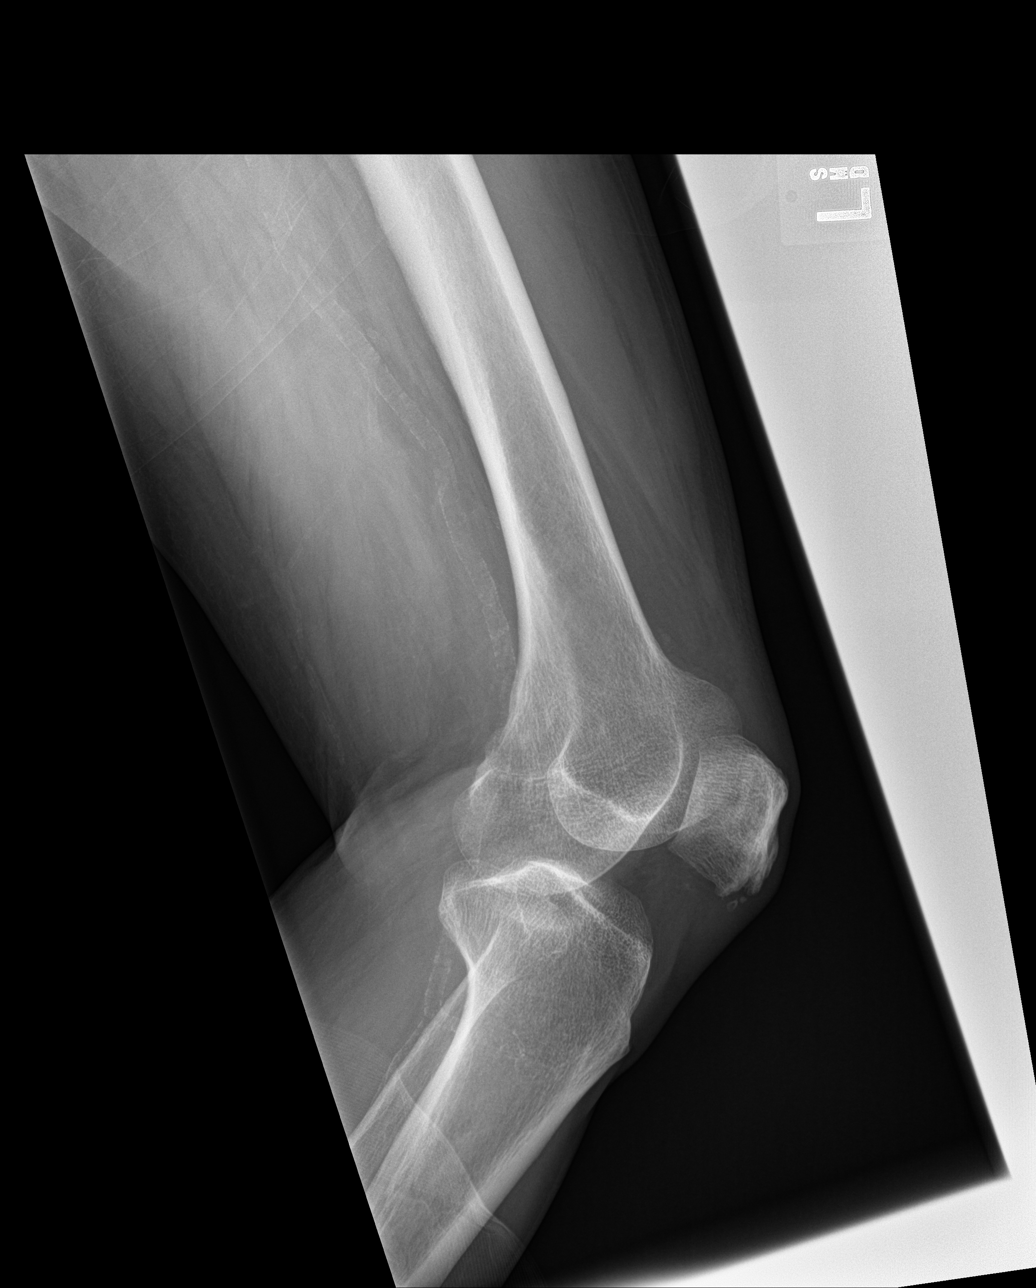

[femur lat (2 of 2)]
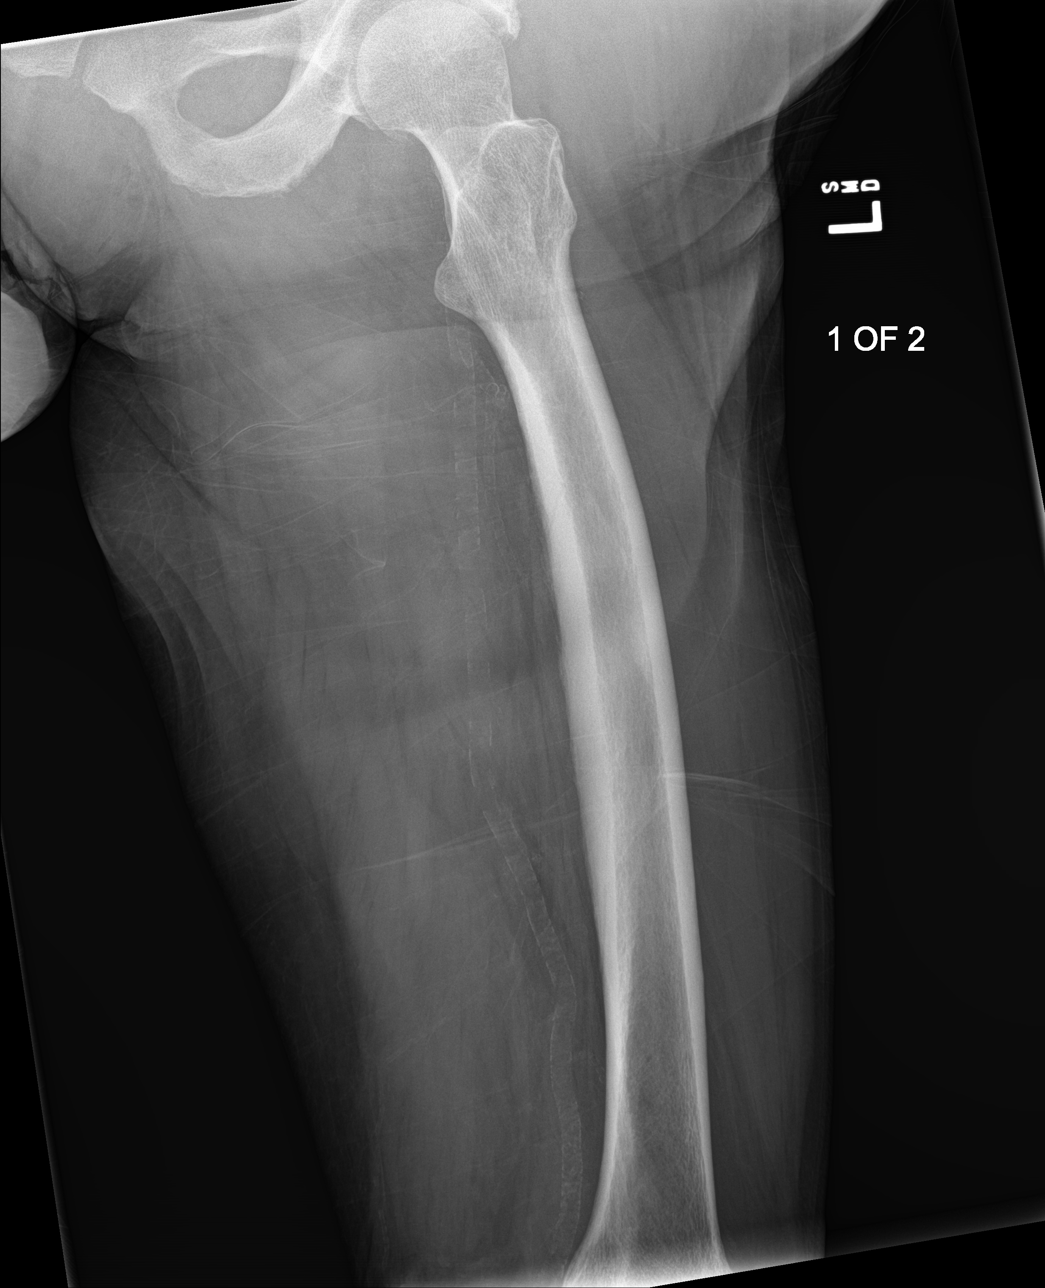

[4 of 4 positions shown; findings below may reference images not displayed]

FINDINGS: Degenerative changes of left hip joint are noted. No acute fracture
or dislocation is seen. No gross soft tissue abnormality is noted.
IMPRESSION: No acute abnormality seen.

## 2019-05-28 ENCOUNTER — Encounter: Payer: Self-pay | Admitting: Family Medicine

## 2019-05-28 IMAGING — US US RENAL
1 series · 14 of 25 positions shown · non-contrast
Comparison: 03/19/2017

CLINICAL DATA: Acute renal failure

EXAM:
RENAL / URINARY TRACT ULTRASOUND COMPLETE

[Series 1: us renal · 0.23mm/px · 14 of 29 slices shown]
[im 1/29]
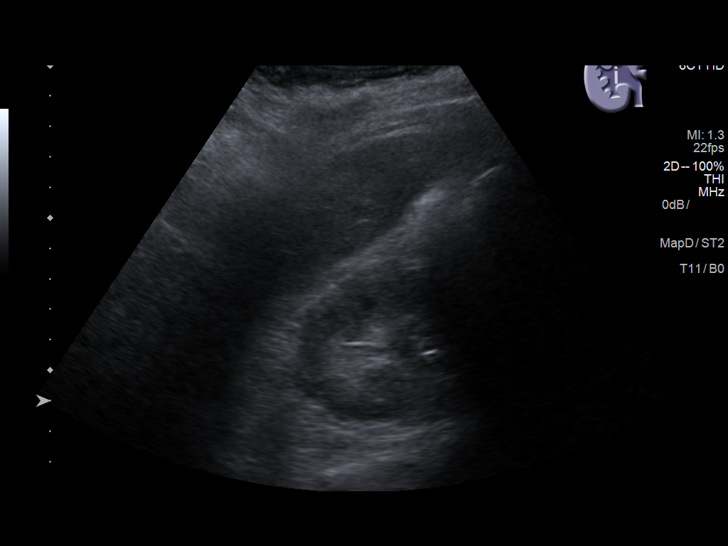
[im 3/29]
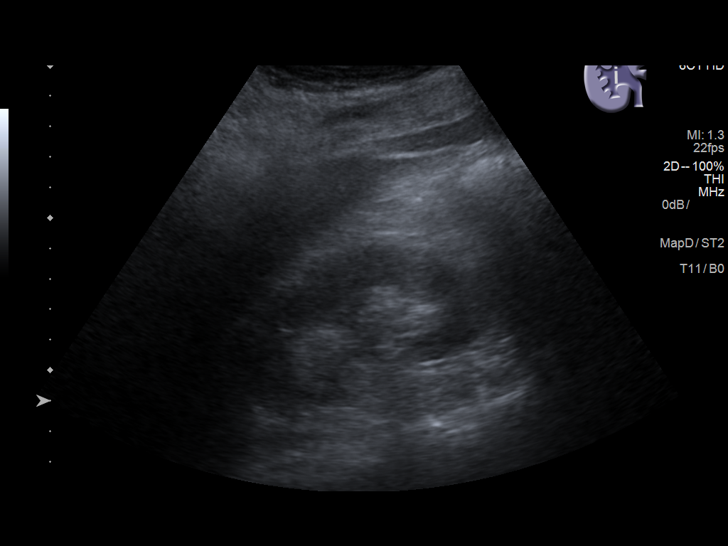
[im 5/29]
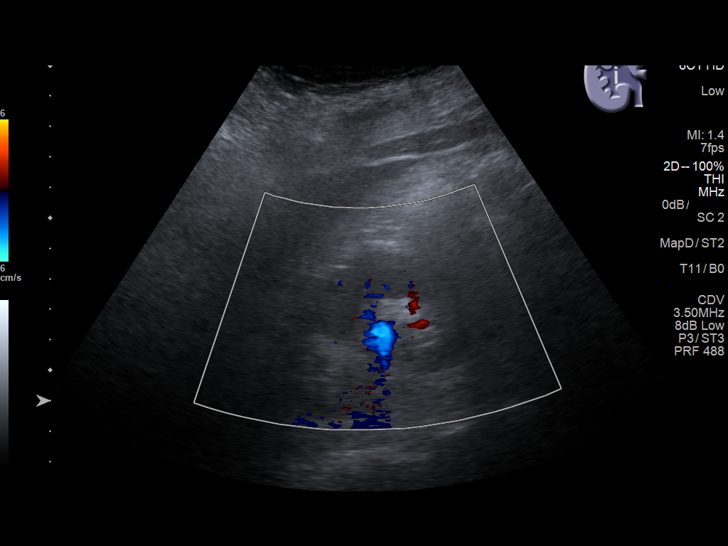
[im 8/29]
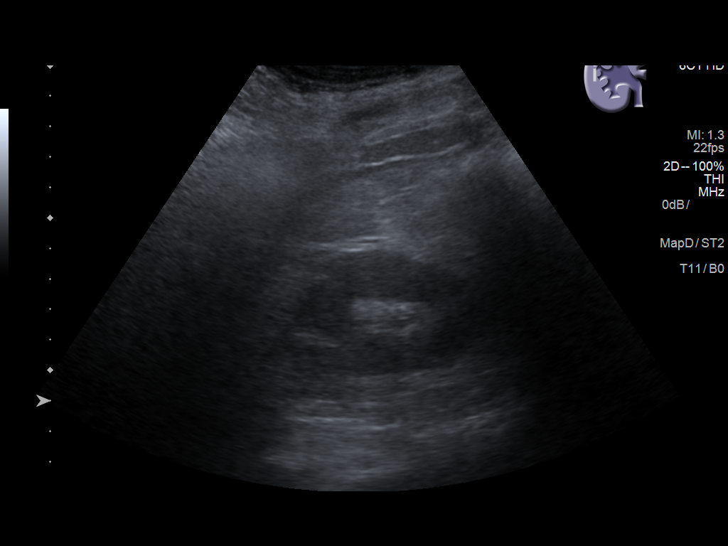
[im 10/29]
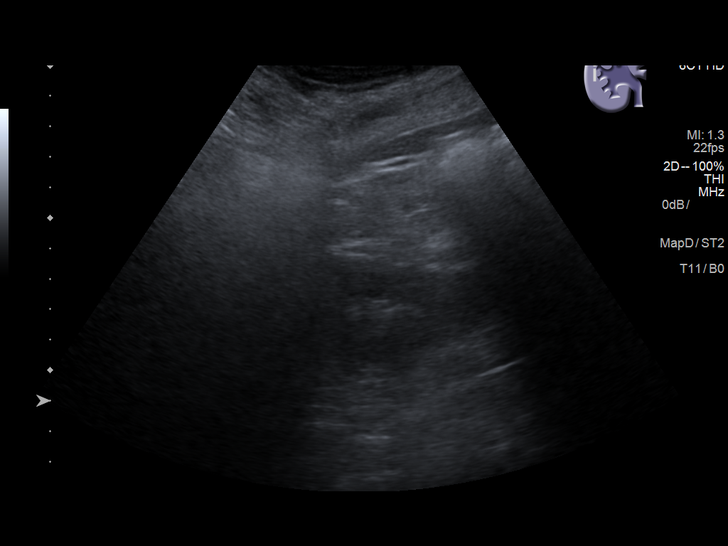
[im 11/29]
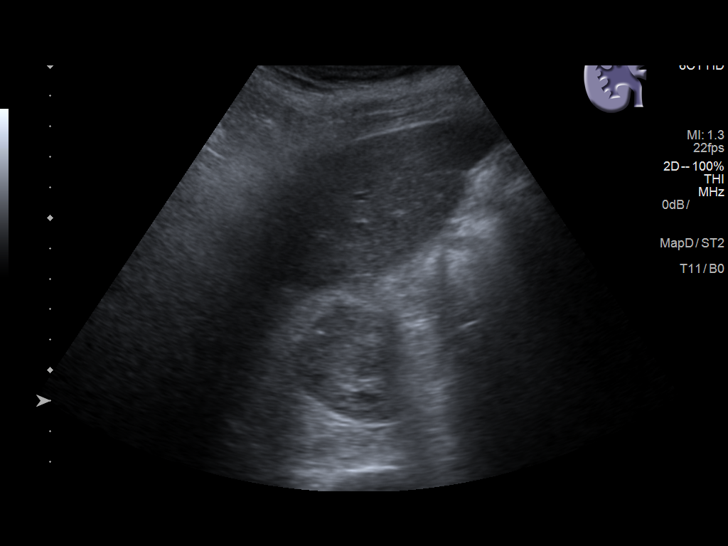
[im 13/29]
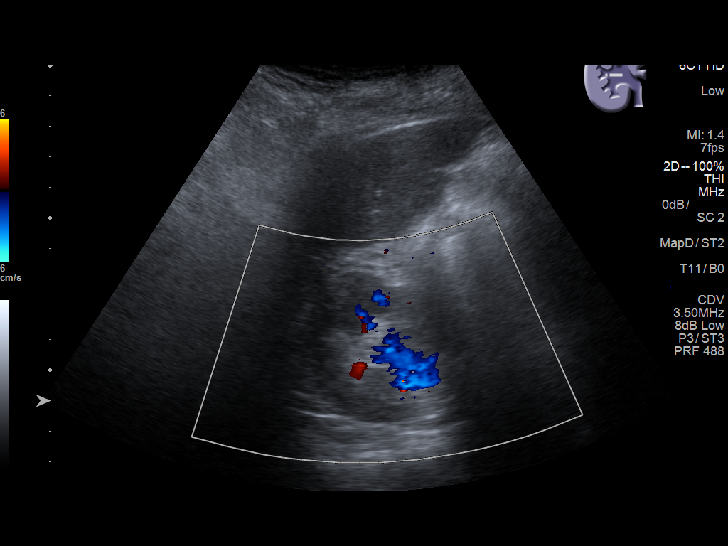
[im 16/29]
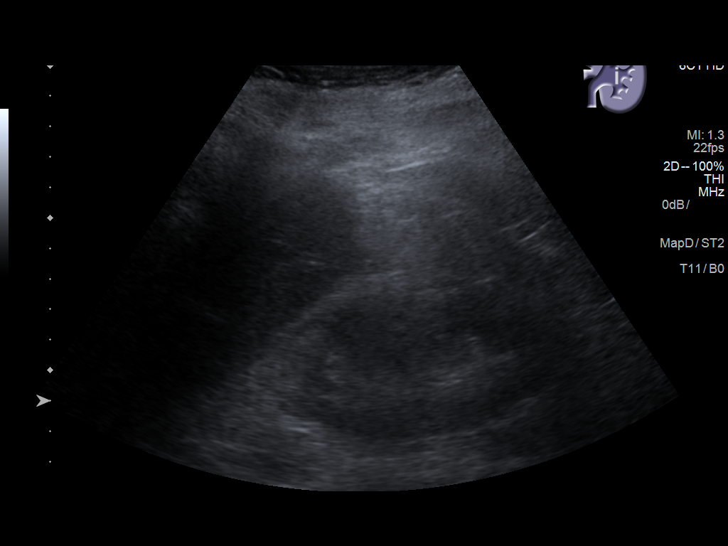
[im 18/29]
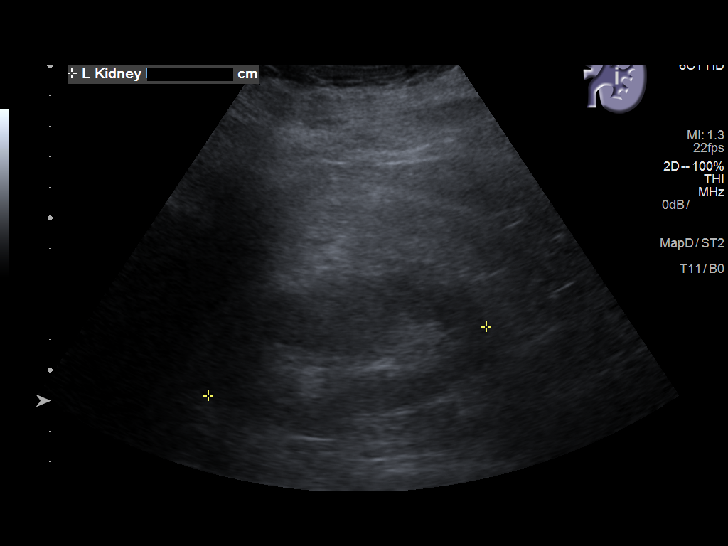
[im 19/29]
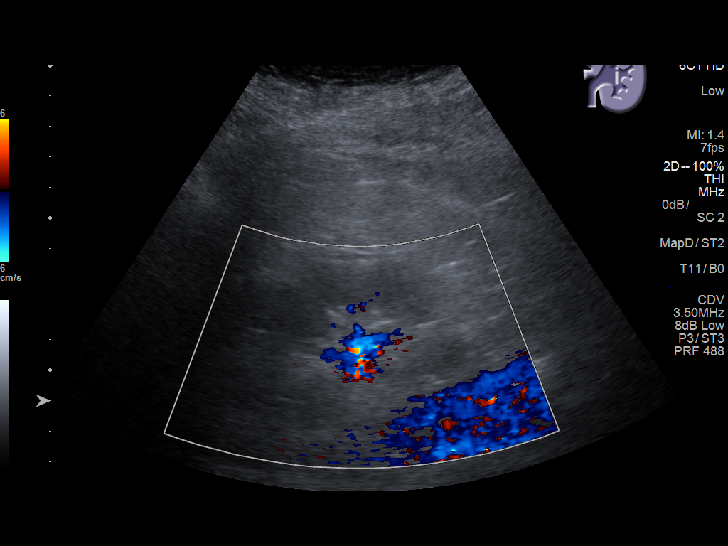
[im 22/29]
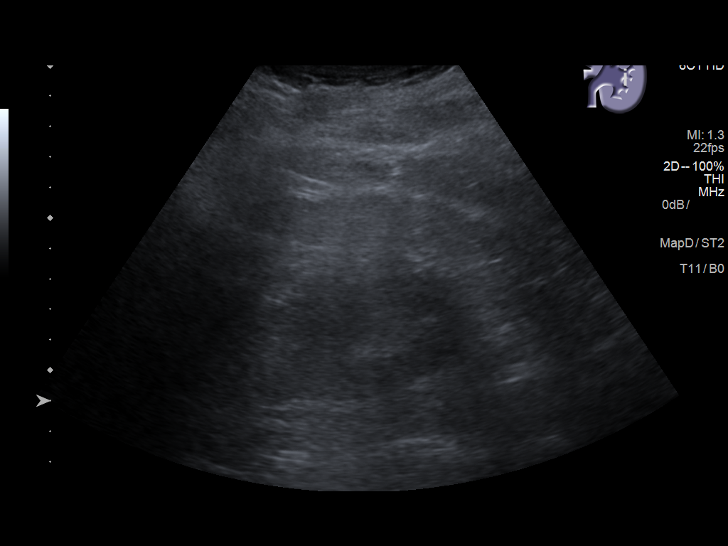
[im 24/29]
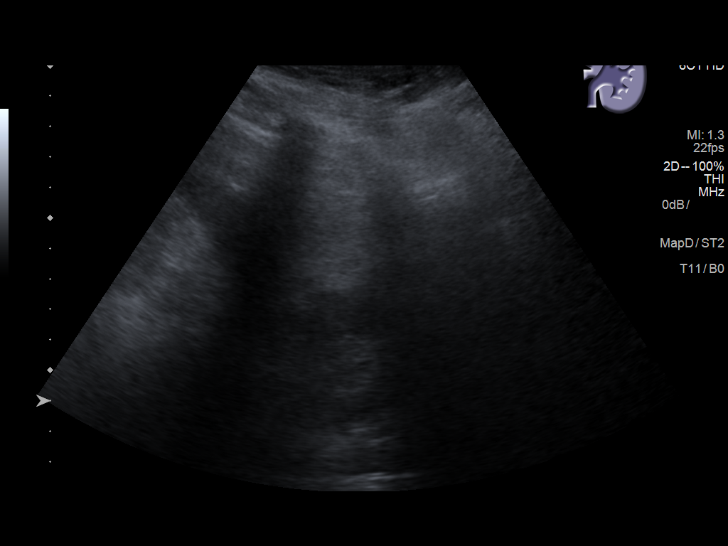
[im 26/29]
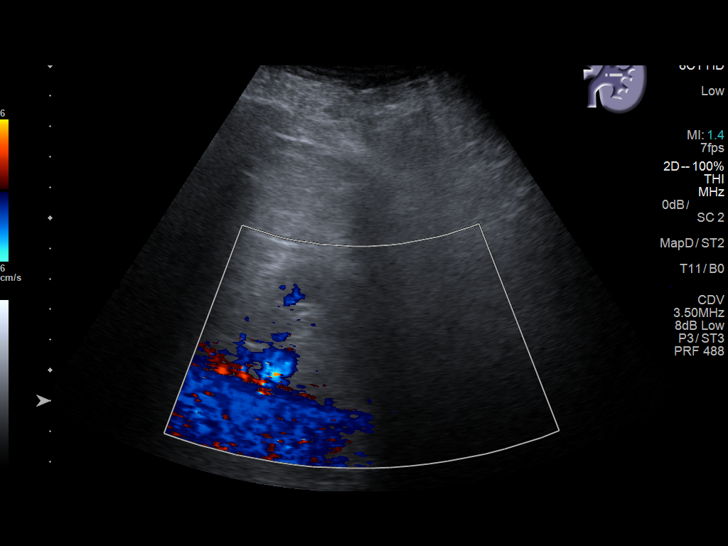
[im 29/29]
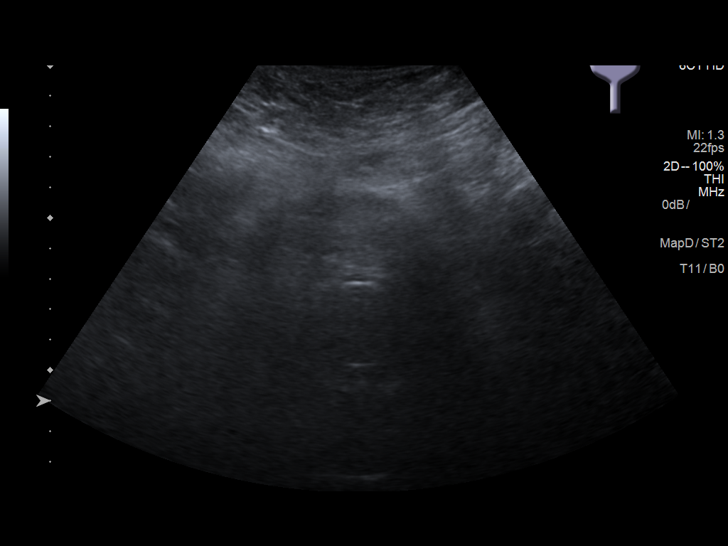

[14 of 25 positions shown; findings below may reference images not displayed]

FINDINGS: Right Kidney:

Length: 8.3 cm. Echogenicity within normal limits. No mass or
hydronephrosis visualized.

Left Kidney:

Length: 9.4 cm. Echogenicity within normal limits. No mass or
hydronephrosis visualized.

Bladder:

Decompressed by Foley catheter
IMPRESSION: No acute renal abnormality is noted. The overall appearance is
similar to that seen on recent CT examination. The punctate lower
pole left renal calculus is not well visualized.

## 2019-05-29 DIAGNOSIS — H25811 Combined forms of age-related cataract, right eye: Secondary | ICD-10-CM | POA: Diagnosis not present

## 2019-05-29 DIAGNOSIS — H2511 Age-related nuclear cataract, right eye: Secondary | ICD-10-CM | POA: Diagnosis not present

## 2019-06-02 ENCOUNTER — Ambulatory Visit
Admission: RE | Admit: 2019-06-02 | Discharge: 2019-06-02 | Disposition: A | Discharge status: Home / Self Care | Payer: Medicare Other | Source: Ambulatory Visit | Attending: Obstetrics and Gynecology | Admitting: Obstetrics and Gynecology

## 2019-06-02 DIAGNOSIS — N6311 Unspecified lump in the right breast, upper outer quadrant: Secondary | ICD-10-CM | POA: Insufficient documentation

## 2019-06-02 DIAGNOSIS — Z1231 Encounter for screening mammogram for malignant neoplasm of breast: Secondary | ICD-10-CM

## 2019-06-02 DIAGNOSIS — N6320 Unspecified lump in the left breast, unspecified quadrant: Secondary | ICD-10-CM

## 2019-06-02 DIAGNOSIS — N6489 Other specified disorders of breast: Secondary | ICD-10-CM | POA: Diagnosis not present

## 2019-06-02 DIAGNOSIS — R928 Other abnormal and inconclusive findings on diagnostic imaging of breast: Secondary | ICD-10-CM | POA: Diagnosis not present

## 2019-06-02 DIAGNOSIS — N6321 Unspecified lump in the left breast, upper outer quadrant: Secondary | ICD-10-CM | POA: Insufficient documentation

## 2019-06-02 DIAGNOSIS — N6310 Unspecified lump in the right breast, unspecified quadrant: Secondary | ICD-10-CM

## 2019-06-02 DIAGNOSIS — N63 Unspecified lump in unspecified breast: Secondary | ICD-10-CM

## 2019-06-05 DIAGNOSIS — I639 Cerebral infarction, unspecified: Secondary | ICD-10-CM | POA: Diagnosis not present

## 2019-06-05 DIAGNOSIS — G43019 Migraine without aura, intractable, without status migrainosus: Secondary | ICD-10-CM | POA: Diagnosis not present

## 2019-06-05 DIAGNOSIS — G5603 Carpal tunnel syndrome, bilateral upper limbs: Secondary | ICD-10-CM | POA: Diagnosis not present

## 2019-06-05 DIAGNOSIS — R9082 White matter disease, unspecified: Secondary | ICD-10-CM | POA: Diagnosis not present

## 2019-06-05 DIAGNOSIS — R2681 Unsteadiness on feet: Secondary | ICD-10-CM | POA: Diagnosis not present

## 2019-06-05 DIAGNOSIS — R2689 Other abnormalities of gait and mobility: Secondary | ICD-10-CM | POA: Diagnosis not present

## 2019-06-06 ENCOUNTER — Other Ambulatory Visit: Payer: Self-pay | Admitting: Family Medicine

## 2019-06-06 DIAGNOSIS — K219 Gastro-esophageal reflux disease without esophagitis: Secondary | ICD-10-CM

## 2019-06-06 NOTE — Telephone Encounter (Signed)
Requested medication (s) are due for refill today: Yes  Requested medication (s) are on the active medication list: Yes  Last refill:  03/02/18  Future visit scheduled: Yes  Notes to clinic:  Prescription has expired.    Requested Prescriptions  Pending Prescriptions Disp Refills   pantoprazole (PROTONIX) 40 MG tablet [Pharmacy Med Name: Pantoprazole Sodium 40 MG Oral Tablet Delayed Release] 180 tablet 0    Sig: Take 1 tablet by mouth twice daily      Gastroenterology: Proton Pump Inhibitors Passed - 06/06/2019 10:35 AM      Passed - Valid encounter within last 12 months    Recent Outpatient Visits           1 month ago Chest pain, unspecified type   [facility] [name], MD   2 months ago [place], Donald E, MD   4 months ago Chest pain, unspecified type   [facility] [name], MD   4 months ago Recurrent UTI   [place], [place], PA-C   5 months ago Type 2 diabetes mellitus with stage 3a chronic kidney disease, without long-term current use of insulin [facility])   [facility] [name], MD       Future Appointments             In 3 days Fisher, Kirstie Peri, MD [facility], [facility]   In 2 months  [facility], [place]   In 2 months Fisher, Kirstie Peri, MD [facility], [place]   In 4 months [place], Kathlene November, MD [facility], [place]

## 2019-06-09 ENCOUNTER — Emergency Department
Admission: EM | Admit: 2019-06-09 | Discharge: 2019-06-09 | Disposition: A | Discharge status: Home / Self Care | Payer: Medicare Other | Attending: Emergency Medicine | Admitting: Emergency Medicine

## 2019-06-09 ENCOUNTER — Ambulatory Visit: Payer: BC Managed Care – PPO | Admitting: Family Medicine

## 2019-06-09 ENCOUNTER — Encounter: Payer: Self-pay | Admitting: Emergency Medicine

## 2019-06-09 ENCOUNTER — Encounter: Payer: Self-pay | Admitting: Family Medicine

## 2019-06-09 ENCOUNTER — Telehealth: Payer: Self-pay | Admitting: Family Medicine

## 2019-06-09 ENCOUNTER — Emergency Department: Payer: Medicare Other

## 2019-06-09 DIAGNOSIS — N183 Chronic kidney disease, stage 3 unspecified: Secondary | ICD-10-CM | POA: Diagnosis not present

## 2019-06-09 DIAGNOSIS — I129 Hypertensive chronic kidney disease with stage 1 through stage 4 chronic kidney disease, or unspecified chronic kidney disease: Secondary | ICD-10-CM | POA: Diagnosis not present

## 2019-06-09 DIAGNOSIS — Z8544 Personal history of malignant neoplasm of other female genital organs: Secondary | ICD-10-CM | POA: Insufficient documentation

## 2019-06-09 DIAGNOSIS — E1122 Type 2 diabetes mellitus with diabetic chronic kidney disease: Secondary | ICD-10-CM | POA: Insufficient documentation

## 2019-06-09 DIAGNOSIS — Z79899 Other long term (current) drug therapy: Secondary | ICD-10-CM | POA: Insufficient documentation

## 2019-06-09 DIAGNOSIS — Z87891 Personal history of nicotine dependence: Secondary | ICD-10-CM | POA: Diagnosis not present

## 2019-06-09 DIAGNOSIS — I2510 Atherosclerotic heart disease of native coronary artery without angina pectoris: Secondary | ICD-10-CM | POA: Insufficient documentation

## 2019-06-09 DIAGNOSIS — Z8673 Personal history of transient ischemic attack (TIA), and cerebral infarction without residual deficits: Secondary | ICD-10-CM | POA: Insufficient documentation

## 2019-06-09 DIAGNOSIS — N12 Tubulo-interstitial nephritis, not specified as acute or chronic: Secondary | ICD-10-CM | POA: Diagnosis not present

## 2019-06-09 DIAGNOSIS — R1110 Vomiting, unspecified: Secondary | ICD-10-CM | POA: Diagnosis present

## 2019-06-09 DIAGNOSIS — R109 Unspecified abdominal pain: Secondary | ICD-10-CM | POA: Diagnosis not present

## 2019-06-09 LAB — URINALYSIS, COMPLETE (UACMP) WITH MICROSCOPIC
Bilirubin Urine: NEGATIVE
Glucose, UA: NEGATIVE mg/dL
Hgb urine dipstick: NEGATIVE
Ketones, ur: NEGATIVE mg/dL
Nitrite: NEGATIVE
Protein, ur: NEGATIVE mg/dL
RBC / HPF: >50 RBC/hpf — ABNORMAL HIGH (ref 0–5)
Specific Gravity, Urine: 1.011 (ref 1.005–1.030)
WBC, UA: >50 WBC/hpf — ABNORMAL HIGH (ref 0–5)
pH: 5 (ref 5.0–8.0)

## 2019-06-09 LAB — COMPREHENSIVE METABOLIC PANEL
ALT: 18 U/L (ref 0–44)
AST: 20 U/L (ref 15–41)
Albumin: 2.9 g/dL — ABNORMAL LOW (ref 3.5–5.0)
Alkaline Phosphatase: 115 U/L (ref 38–126)
Anion gap: 7 (ref 5–15)
BUN: 21 mg/dL — ABNORMAL HIGH (ref 6–20)
CO2: 23 mmol/L (ref 22–32)
Calcium: 8.2 mg/dL — ABNORMAL LOW (ref 8.9–10.3)
Chloride: 102 mmol/L (ref 98–111)
Creatinine, Ser: 1.55 mg/dL — ABNORMAL HIGH (ref 0.44–1.00)
GFR calc Af Amer: 45 mL/min — ABNORMAL LOW (ref >60)
GFR calc non Af Amer: 39 mL/min — ABNORMAL LOW (ref >60)
Glucose, Bld: 132 mg/dL — ABNORMAL HIGH (ref 70–99)
Potassium: 4.6 mmol/L (ref 3.5–5.1)
Sodium: 132 mmol/L — ABNORMAL LOW (ref 135–145)
Total Bilirubin: 0.8 mg/dL (ref 0.3–1.2)
Total Protein: 5.8 g/dL — ABNORMAL LOW (ref 6.5–8.1)

## 2019-06-09 LAB — CBC
HCT: 33.4 % — ABNORMAL LOW (ref 36.0–46.0)
Hemoglobin: 11.6 g/dL — ABNORMAL LOW (ref 12.0–15.0)
MCH: 28.6 pg (ref 26.0–34.0)
MCHC: 34.7 g/dL (ref 30.0–36.0)
MCV: 82.5 fL (ref 80.0–100.0)
Platelets: 196 10*3/uL (ref 150–400)
RBC: 4.05 MIL/uL (ref 3.87–5.11)
RDW: 13.3 % (ref 11.5–15.5)
WBC: 13.6 10*3/uL — ABNORMAL HIGH (ref 4.0–10.5)
nRBC: 0 % (ref 0.0–0.2)

## 2019-06-09 LAB — LIPASE, BLOOD: Lipase: 17 U/L (ref 11–51)

## 2019-06-09 LAB — LACTIC ACID, PLASMA: Lactic Acid, Venous: 1.5 mmol/L (ref 0.5–1.9)

## 2019-06-09 MED ORDER — CIPROFLOXACIN HCL 500 MG PO TABS
500.0000 mg | ORAL_TABLET | Freq: Two times a day (BID) | ORAL | 0 refills | Status: AC
Start: 2019-06-09 — End: 2019-06-19

## 2019-06-09 MED ORDER — SODIUM CHLORIDE 0.9% FLUSH: 3.0000 mL | Freq: Once | INTRAVENOUS | Status: DC

## 2019-06-09 MED ORDER — ONDANSETRON HCL 4 MG/2ML IJ SOLN
4.0000 mg | Freq: Once | INTRAMUSCULAR | Status: AC
  Administered 2019-06-09: 4 mg via INTRAVENOUS
  Filled 2019-06-09: qty 2

## 2019-06-09 MED ORDER — SODIUM CHLORIDE 0.9 % IV SOLN
2.0000 g | Freq: Once | INTRAVENOUS | Status: AC
  Administered 2019-06-09: 2 g via INTRAVENOUS
  Filled 2019-06-09: qty 20

## 2019-06-09 MED ORDER — OXYCODONE-ACETAMINOPHEN 5-325 MG PO TABS: 1.0000 | ORAL_TABLET | Freq: Three times a day (TID) | ORAL | 0 refills | Status: DC | PRN

## 2019-06-09 MED ORDER — SODIUM CHLORIDE 0.9 % IV SOLN: Freq: Once | INTRAVENOUS | Status: AC

## 2019-06-09 MED ORDER — MORPHINE SULFATE (PF) 4 MG/ML IV SOLN
4.0000 mg | Freq: Once | INTRAVENOUS | Status: AC
  Administered 2019-06-09: 21:00:00 4 mg via INTRAVENOUS
  Filled 2019-06-09: qty 1

## 2019-06-09 NOTE — ED Notes (Signed)
Reviewed discharge instructions, follow-up care, and prescriptions with patient. Patient verbalized understanding of all information reviewed. Patient stable, with no distress noted at this time.    

## 2019-06-09 NOTE — ED Triage Notes (Signed)
Pt to ED with c/o of Emesis and fever that started this morning. Pt reports 5 emesis occurrences and fever at home of 102. Pt's temp in triage 100.4. Pt denies diarrhea.

## 2019-06-09 NOTE — Telephone Encounter (Signed)
I called and spoke with patient and advised her as below. She says she did get Dr. Maralyn Sago message and plans to head over to the ER. Her son [name] is leaving work right now to take her to the ER.

## 2019-06-09 NOTE — Telephone Encounter (Signed)
Patient sent MyChart message that she is aching, feels horrible, is nauseated and has fever of 101. I sent MyChart message telling her to go to urgent care, can you call her and make sure she gets the message. Thanks.

## 2019-06-09 NOTE — ED Provider Notes (Signed)
[facility] Emergency Department Provider Note       Time seen: ----------------------------------------- 7:47 PM on 06/09/2019 -----------------------------------------   I have reviewed the triage vital signs and the nursing notes.  HISTORY   Chief Complaint Emesis    HPI [name] is a 49 y.o. female with a history of pyelonephritis, coronary artery disease, CVA, sepsis, who presents to the ED for vomiting and fever that started this morning.  Patient reports 5 episodes and fever 102 at home.  She denies any diarrhea.  Patient describes dysuria and pain that radiates into her left flank.  She has only urinated once today.  Past Medical History:  Diagnosis Date  . Acute colitis 01/27/2017  . Acute pyelonephritis   . Anemia    iron deficiency anemia  . Aortic arch aneurysm ([place])   . BRCA negative 2014  . CAD (coronary artery disease)    a. 08/2003 Cath: LAD 30-40-med Rx; b. 11/2014 PCI: LAD 48m(3.25x23 Xience Alpine DES); c. 06/2015 PCI: D1 (2.25x12 Resolute Integrity DES); d. 06/2017 PCI: Patent mLAD stent, D2 95 (PTCA); e. 09/2017 PCI: D2 99ost (CBA); d. 12/2017 Cath: LM nl, LAD 372m80d (small), D1 40ost, D2 95ost, LCX 40p, RCA 40ost/p->Med rx for D2 given restenosis.  . Colitis 06/03/2015  . Colon polyp   . CVA (cerebral vascular accident) (HCMilton   Left side weakness.   . Degenerative tear of glenoid labrum of right shoulder 03/15/2017  . Family history of breast cancer    BRCA neg 2014  . Femur fracture, left (HCGillette9/10/2018  . Gastric ulcer 04/27/2011  . History of echocardiogram    a. 03/2017 Echo: EF 60-65%, no rwma; b. 02/2018 Echo: EF 60-65%, no rwma. Nl RV fxn. No cardiac source of emboli (admitted w/ stroke).  . Malignant melanoma of skin of scalp (HCNowata  . MI, acute, non ST segment elevation (HCMenasha  . Neuromuscular disorder (HCLeRoy  . S/P drug eluting coronary stent placement 06/04/2015  . Sepsis (HCLake Cavanaugh2/14/2019  . [facility] ([facility]    a. 02/2018 MRI: 72m82mate acute/early subacute L medial frontal lobe inarct; b. 02/2018 MRA No large vessel occlusion or aneurysm. Mod to sev L P2 stenosis. thready L vertebral artery, diffusely dzs'd; c. 02/2018 Carotid U/S: <50% bilat ICA dzs.    Patient Active Problem List   Diagnosis Date Noted  . Family history of breast cancer 05/23/2019  . Family history of colon cancer 05/23/2019  . Abnormal LFTs 04/18/2019  . Esophageal dysphagia   . Gastrointestinal tract imaging abnormality   . Trochanteric bursitis, left hip 03/17/2019  . Malignant neoplasm of vulva, unspecified (HCCEncantada-Ranchito-El Calaboz2/06/2019  . Peripheral vascular disease, unspecified (HCCBassett2/06/2019  . Paroxysmal supraventricular tachycardia (HCCMead2/06/2019  . Contusion of left hip 03/06/2019  . Chest pain 01/27/2019  . Melena   . Hypertensive urgency   . Recurrent chest pain 01/19/2019  . Depression, major, single episode, moderate (HCCTaylor1/07/2018  . Closed nondisplaced intertrochanteric fracture of left femur (HCCBethel Manor9/10/2018  . Hypotension 06/26/2018  . Autonomic neuropathy 03/24/2018  . Acute delirium 03/03/2018  . H/O gastric bypass 03/02/2018  . Hypertension associated with stage 3 chronic kidney disease due to type 2 diabetes mellitus (HCCNorth Cape May1/19/2020  . SI (sacroiliac) joint dysfunction 12/02/2017  . Effort angina (HCCHampshire5/28/2019  . Unstable angina (HCCSearcy5/23/2019  . Syncope 04/09/2017  . Insomnia 03/18/2017  . Ischemic cardiomyopathy   . Arthritis   . Anxiety   .  Tendinitis of upper biceps tendon of right shoulder 03/16/2017  . Hx of colonic polyps   . H/O medication noncompliance 12/14/2015  . CAD in native artery   . Hypertensive heart disease   . Type 2 DM with CKD stage 4 and hypertension ([place]) 06/05/2015  . Status post bariatric surgery 06/04/2015  . Carotid stenosis 04/30/2015  . Stable angina pectoris ([place]) 04/17/2015  . Iron deficiency anemia 03/22/2015  . Vitamin B12 deficiency 02/18/2015  . Misuse of  medications for pain 02/18/2015  . Major depressive disorder, recurrent, severe with psychotic features ([place]) 02/15/2015  . Helicobacter pylori infection 11/23/2014  . Hemiparesis, left ([place]) 11/23/2014  . Malignant melanoma ([place]) 08/25/2014  . Chronic systolic CHF (congestive heart failure) ([place])   . Incomplete bladder emptying 07/12/2014  . Hypothyroidism 12/30/2013  . Aberrant subclavian artery 11/17/2013  . Multiple sclerosis ([place]) 11/02/2013  . History of CVA with residual deficit 06/20/2013  . Headache, migraine 05/29/2013  . Hyperlipidemia   . GERD (gastroesophageal reflux disease)   . Neuropathy ([place]) 01/02/2011  . Stroke ([place]) 06/21/2008  . Depression with anxiety 05/01/2008  . Essential hypertension 05/01/2008    Past Surgical History:  Procedure Laterality Date  . APPENDECTOMY    . CARDIAC CATHETERIZATION N/A 11/09/2014   Procedure: Coronary Angiography;  Surgeon: Minna Merritts, MD;  Location: [place] CV LAB;  Service: Cardiovascular;  Laterality: N/A;  . CARDIAC CATHETERIZATION N/A 11/12/2014   Procedure: Coronary Stent Intervention;  Surgeon: Isaias Cowman, MD;  Location: [place] [facility];  Service: Cardiovascular;  Laterality: N/A;  . CARDIAC CATHETERIZATION N/A 04/18/2015   Procedure: Left Heart Cath and Coronary Angiography;  Surgeon: Minna Merritts, MD;  Location: [place] CV LAB;  Service: Cardiovascular;  Laterality: N/A;  . CARDIAC CATHETERIZATION Left 06/04/2015   Procedure: Left Heart Cath and Coronary Angiography;  Surgeon: Wellington Hampshire, MD;  Location: [place] CV LAB;  Service: Cardiovascular;  Laterality: Left;  . CARDIAC CATHETERIZATION N/A 06/04/2015   Procedure: Coronary Stent Intervention;  Surgeon: Wellington Hampshire, MD;  Location: [place] CV LAB;  Service: Cardiovascular;  Laterality: N/A;  . CESAREAN SECTION  2001  . CHOLECYSTECTOMY N/A 11/18/2016   Procedure: LAPAROSCOPIC CHOLECYSTECTOMY WITH INTRAOPERATIVE  CHOLANGIOGRAM;  Surgeon: Christene Lye, MD;  Location: [facility] ORS;  Service: General;  Laterality: N/A;  . COLONOSCOPY WITH PROPOFOL N/A 04/27/2016   Procedure: COLONOSCOPY WITH PROPOFOL;  Surgeon: [name], MD;  Location: [place];  Service: Endoscopy;  Laterality: N/A;  . COLONOSCOPY WITH PROPOFOL N/A 01/12/2018   Procedure: COLONOSCOPY WITH PROPOFOL;  Surgeon: [place], Benay Pike, MD;  Location: [facility] ENDOSCOPY;  Service: Endoscopy;  Laterality: N/A;  . CORONARY ANGIOPLASTY    . CORONARY BALLOON ANGIOPLASTY N/A 06/29/2017   Procedure: CORONARY BALLOON ANGIOPLASTY;  Surgeon: Wellington Hampshire, MD;  Location: [place] CV LAB;  Service: Cardiovascular;  Laterality: N/A;  . CORONARY BALLOON ANGIOPLASTY N/A 09/20/2017   Procedure: CORONARY BALLOON ANGIOPLASTY;  Surgeon: Wellington Hampshire, MD;  Location: [place] CV LAB;  Service: Cardiovascular;  Laterality: N/A;  . DILATION AND CURETTAGE OF UTERUS    . ESOPHAGOGASTRODUODENOSCOPY (EGD) WITH PROPOFOL N/A 09/14/2014   Procedure: ESOPHAGOGASTRODUODENOSCOPY (EGD) WITH PROPOFOL;  Surgeon: Josefine Class, MD;  [facility] END[facility];  Service: Endoscopy;  Laterality: N/A;  . ESOPHAGOGASTRODUODENOSCOPY (EGD) WITH PROPOFOL N/A 04/27/2016   Procedure: ESOPHAGOGASTRODUODENOSCOPY (EGD) WITH PROPOFOL;  Surgeon: [name], MD;  Location: [place];  Service: Endoscopy;  Laterality: N/A;  Diabetic -  oral meds  . ESOPHAGOGASTRODUODENOSCOPY (EGD) WITH PROPOFOL N/A 01/12/2018   Procedure: ESOPHAGOGASTRODUODENOSCOPY (EGD) WITH PROPOFOL;  Surgeon: [place], Benay Pike, MD;  Location: [facility] ENDOSCOPY;  Service: Endoscopy;  Laterality: N/A;  . ESOPHAGOGASTRODUODENOSCOPY (EGD) WITH PROPOFOL N/A 04/11/2019   Procedure: ESOPHAGOGASTRODUODENOSCOPY (EGD) WITH PROPOFOL;  Surgeon: [name], MD;  Location: [facility] ENDOSCOPY;  Service: Endoscopy;  Laterality: N/A;  . GASTRIC BYPASS  09/2009   [place]  (IM) NAIL INTERTROCHANTERIC Left 10/13/2018   Procedure: INTRAMEDULLARY (IM) NAIL INTERTROCHANTRIC;  Surgeon: Leandrew Koyanagi, MD;  Location: [place];  Service: Orthopedics;  Laterality: Left;  . Left Carotid to sublcavian artery bypass w/ subclavian artery ligation     a. Performed @ [place].  . LEFT HEART CATH AND CORONARY ANGIOGRAPHY Left 06/29/2017   Procedure: LEFT HEART CATH AND CORONARY ANGIOGRAPHY;  Surgeon: Wellington Hampshire, MD;  Location: [place] CV LAB;  Service: Cardiovascular;  Laterality: Left;  . LEFT HEART CATH AND CORONARY ANGIOGRAPHY N/A 09/20/2017   Procedure: LEFT HEART CATH AND CORONARY ANGIOGRAPHY;  Surgeon: Wellington Hampshire, MD;  Location: [place] CV LAB;  Service: Cardiovascular;  Laterality: N/A;  . LEFT HEART CATH AND CORONARY ANGIOGRAPHY N/A 12/20/2017   Procedure: LEFT HEART CATH AND CORONARY ANGIOGRAPHY;  Surgeon: Wellington Hampshire, MD;  Location: [place] CV LAB;  Service: Cardiovascular;  Laterality: N/A;  . LEFT HEART CATH AND CORONARY ANGIOGRAPHY N/A 04/20/2019   Procedure: LEFT HEART CATH AND CORONARY ANGIOGRAPHY possible PCI;  Surgeon: Minna Merritts, MD;  Location: [place] CV LAB;  Service: Cardiovascular;  Laterality: N/A;  . MELANOMA EXCISION  2016   Dr. Evorn Gong  . [place]  2002  . RIGHT OOPHORECTOMY    . SHOULDER ARTHROSCOPY WITH OPEN ROTATOR CUFF REPAIR Right 01/07/2016   Procedure: SHOULDER ARTHROSCOPY WITH DEBRIDMENT, SUBACHROMIAL DECOMPRESSION;  Surgeon: Corky Mull, MD;  Location: [facility];  Service: Orthopedics;  Laterality: Right;  . SHOULDER ARTHROSCOPY WITH OPEN ROTATOR CUFF REPAIR Right 03/16/2017   Procedure: SHOULDER ARTHROSCOPY WITH OPEN ROTATOR CUFF REPAIR POSSIBLE BICEPS TENODESIS;  Surgeon: Corky Mull, MD;  Location: [facility];  Service: Orthopedics;  Laterality: Right;  . TRIGGER FINGER RELEASE Right     [place]    Allergies Lipitor [atorvastatin] and Tramadol  Social History Social History    Tobacco Use  . Smoking status: Former Smoker    Types: Cigarettes    Quit date: 08/31/1994    Years since quitting: 24.7  . Smokeless tobacco: Never Used  . Tobacco comment: quit 28 years ago  Substance Use Topics  . Alcohol use: No    Alcohol/week: 0.0 standard drinks  . Drug use: No    Review of Systems Constitutional: Positive for fever Cardiovascular: Negative for chest pain. Respiratory: Negative for shortness of breath. Gastrointestinal: Positive for flank pain, vomiting Musculoskeletal: Negative for back pain. Skin: Negative for rash. Neurological: Negative for headaches, focal weakness or numbness.  All systems negative/normal/unremarkable except as stated in the HPI  ____________________________________________   PHYSICAL EXAM:  VITAL SIGNS: ED Triage Vitals  Enc Vitals Group     BP 06/09/19 1825 92/68     Pulse Rate 06/09/19 1825 88     Resp --      Temp 06/09/19 1825 (!) 100.4 F (38 C)     Temp src --      SpO2 06/09/19 1825 97 %     Weight 06/09/19 1827 145 lb (65.8 kg)  Height 06/09/19 1827 '5\' 2"'  (1.575 m)     Head Circumference --      Peak Flow --      Pain Score 06/09/19 1826 10     Pain Loc --      Pain Edu? --      Excl. in [place]? --     Constitutional: Alert and oriented.  Mild distress Eyes: Conjunctivae are normal. Normal extraocular movements. Cardiovascular: Normal rate, regular rhythm. No murmurs, rubs, or gallops. Respiratory: Normal respiratory effort without tachypnea nor retractions. Breath sounds are clear and equal bilaterally. No wheezes/rales/rhonchi. Gastrointestinal: Left flank tenderness, no rebound or guarding.  Normal bowel sounds.  CVA tenderness is noted Musculoskeletal: Nontender with normal range of motion in extremities. No lower extremity tenderness nor edema. Neurologic:  Normal speech and language. No gross focal neurologic deficits are appreciated.  Skin:  Skin is warm, dry and intact. No rash  noted. Psychiatric: Mood and affect are normal. Speech and behavior are normal.  ____________________________________________  ED COURSE:  As part of my medical decision making, I reviewed the following data within the [place] History obtained from family if available, nursing notes, old chart and ekg, as well as notes from prior ED visits. Patient presented for vomiting and fever, we will assess with labs and imaging as indicated at this time.   Procedures  [place] was evaluated in Emergency Department on 06/09/2019 for the symptoms described in the history of present illness. She was evaluated in the context of the global COVID-19 pandemic, which necessitated consideration that the patient might be at risk for infection with the SARS-CoV-2 virus that causes COVID-19. Institutional protocols and algorithms that pertain to the evaluation of patients at risk for COVID-19 are in a state of rapid change based on information released by regulatory bodies including the CDC and federal and state organizations. These policies and algorithms were followed during the patient's care in the ED.  ____________________________________________   LABS (pertinent positives/negatives)  Labs Reviewed  COMPREHENSIVE METABOLIC PANEL - Abnormal; Notable for the following components:      Result Value   Sodium 132 (*)    Glucose, Bld 132 (*)    BUN 21 (*)    Creatinine, Ser 1.55 (*)    Calcium 8.2 (*)    Total Protein 5.8 (*)    Albumin 2.9 (*)    GFR calc non Af Amer 39 (*)    GFR calc Af Amer 45 (*)    All other components within normal limits  CBC - Abnormal; Notable for the following components:   WBC 13.6 (*)    Hemoglobin 11.6 (*)    HCT 33.4 (*)    All other components within normal limits  URINALYSIS, COMPLETE (UACMP) WITH MICROSCOPIC - Abnormal; Notable for the following components:   Color, Urine AMBER (*)    APPearance TURBID (*)    Leukocytes,Ua LARGE (*)     RBC / HPF >50 (*)    WBC, UA >50 (*)    Bacteria, UA MANY (*)    All other components within normal limits  CULTURE, BLOOD (ROUTINE X 2)  CULTURE, BLOOD (ROUTINE X 2)  URINE CULTURE  LIPASE, BLOOD  LACTIC ACID, PLASMA  LACTIC ACID, PLASMA    RADIOLOGY Images were viewed by me  Ultrasound renal  IMPRESSION: 1. Negative for hydronephrosis. Ultrasound appearance of the kidneys is within normal limits 2. 2.5 cm hypoechoic area within the right posterior bladder without significant  vascularity. This could represent a focal collection of debris versus hypovascular soft tissue/mass. Suggest correlation with urinalysis with possible follow-up direct visualization. ____________________________________________   DIFFERENTIAL DIAGNOSIS   Pyelonephritis, UTI, renal colic, dehydration, sepsis  FINAL ASSESSMENT AND PLAN  Pyelonephritis   Plan: The patient had presented for flank pain, fever, vomiting and dysuria. Patient's labs did reveal leukocytosis and chronic kidney disease.  Patient was given IV fluids as well as 2G IV Rocephin after we obtained urine specimen.  Renal ultrasound was also ordered.  Patient's imaging did not reveal any acute process.  I offered admission but the patient declined and wants to try to go home.  She be discharged with antibiotics and encouraged to return for worsening symptoms.   Laurence Aly, MD    Note: This note was generated in part or whole with voice recognition software. Voice recognition is usually quite accurate but there are transcription errors that can and very often do occur. I apologize for any typographical errors that were not detected and corrected.     Earleen Newport, MD 06/09/19 2219

## 2019-06-12 LAB — URINE CULTURE: Culture: >=100000 — AB

## 2019-06-14 LAB — CULTURE, BLOOD (ROUTINE X 2)
Culture: NO GROWTH
Culture: NO GROWTH
Special Requests: ADEQUATE
Specimen Description: ADEQUATE

## 2019-06-16 DIAGNOSIS — H25812 Combined forms of age-related cataract, left eye: Secondary | ICD-10-CM | POA: Diagnosis not present

## 2019-06-16 DIAGNOSIS — H2512 Age-related nuclear cataract, left eye: Secondary | ICD-10-CM | POA: Diagnosis not present

## 2019-06-18 IMAGING — US US CAROTID DUPLEX BILAT
1 series · 13 of 24 positions shown · non-contrast
Comparison: No prior duplex

CLINICAL DATA: 46-year-old female with a history of syncope.

Cardiovascular risk factors include hypertension, known prior
stroke/TIA, hyperlipidemia, diabetes
EXAM:
BILATERAL CAROTID DUPLEX ULTRASOUND
TECHNIQUE: Gray scale imaging, color Doppler and duplex ultrasound were
performed of bilateral carotid and vertebral arteries in the neck.

[Series 1: us carotid duplex bilat · 0.06mm/px · 13 of 68 slices shown]
[im 1/68]
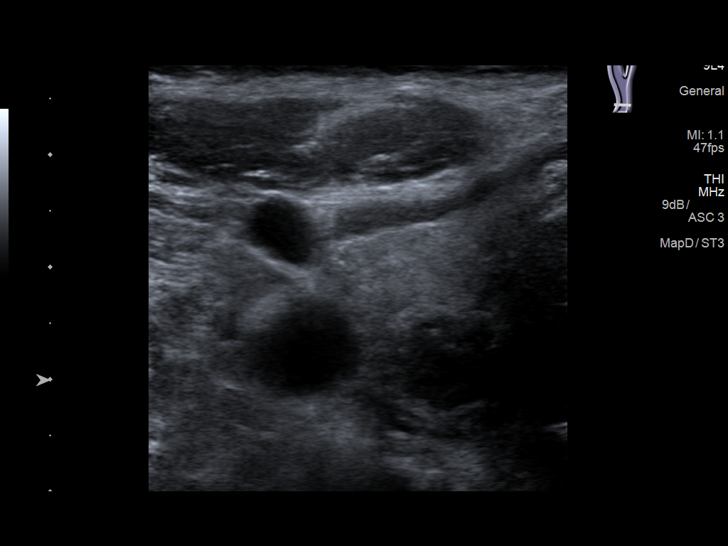
[im 6/68]
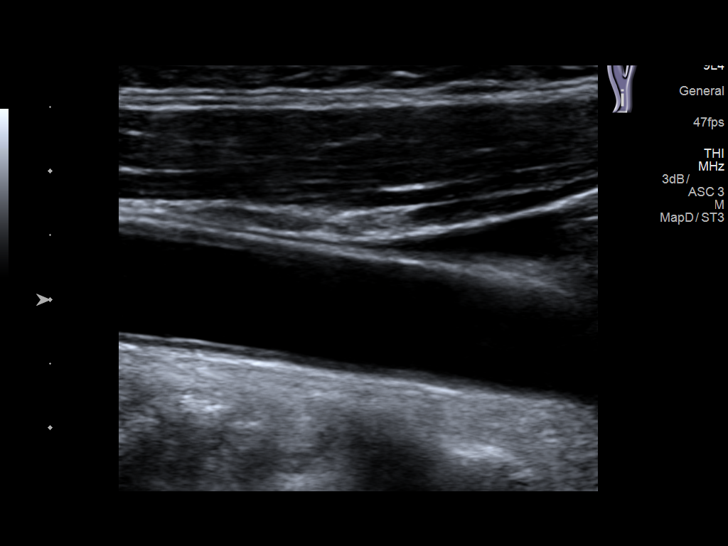
[im 12/68]
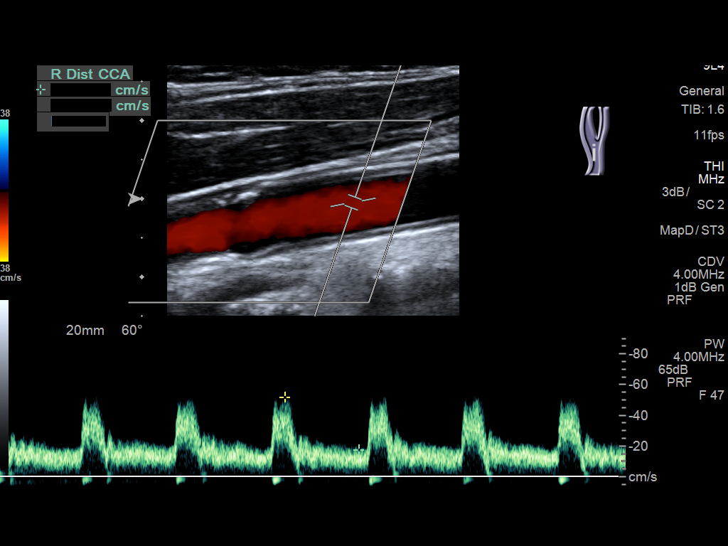
[im 18/68]
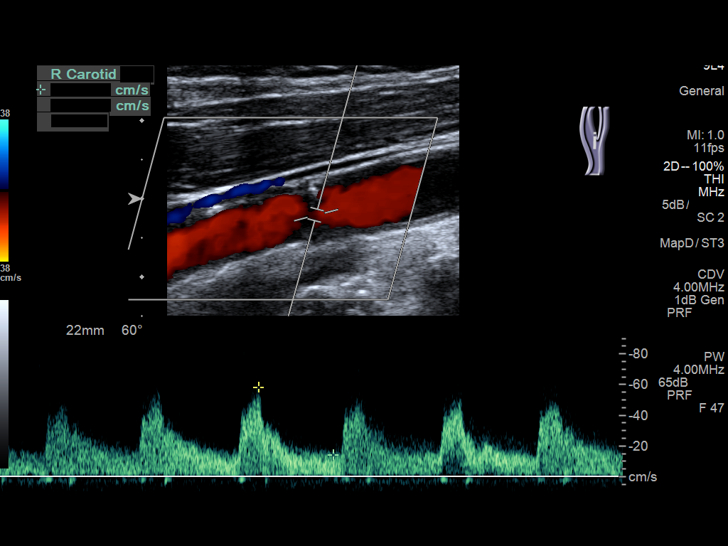
[im 24/68]
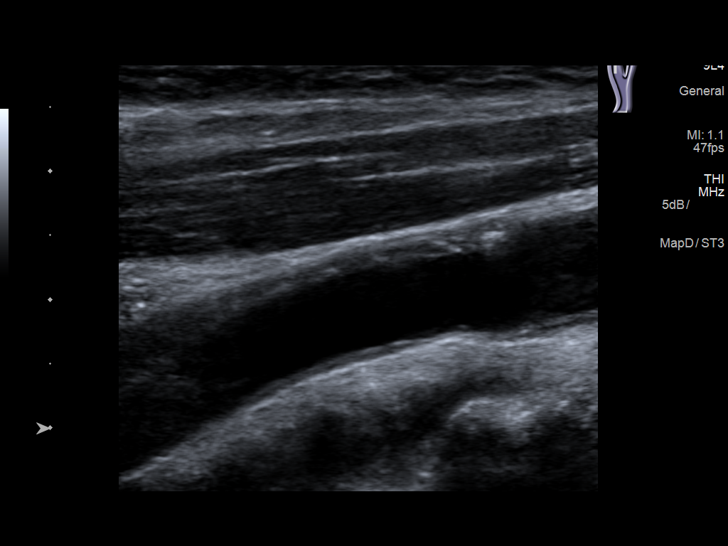
[im 30/68]
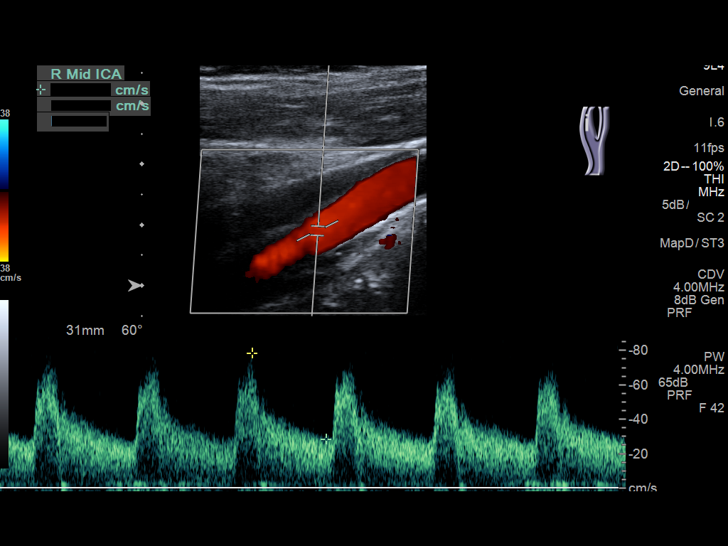
[im 35/68]
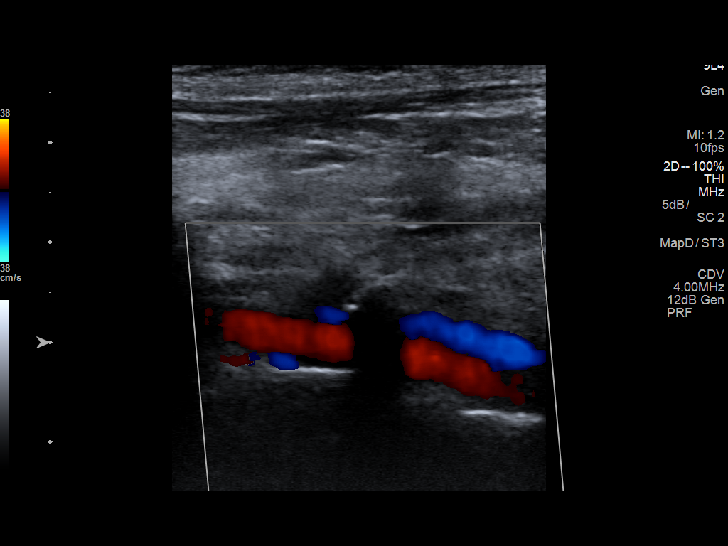
[im 38/68]
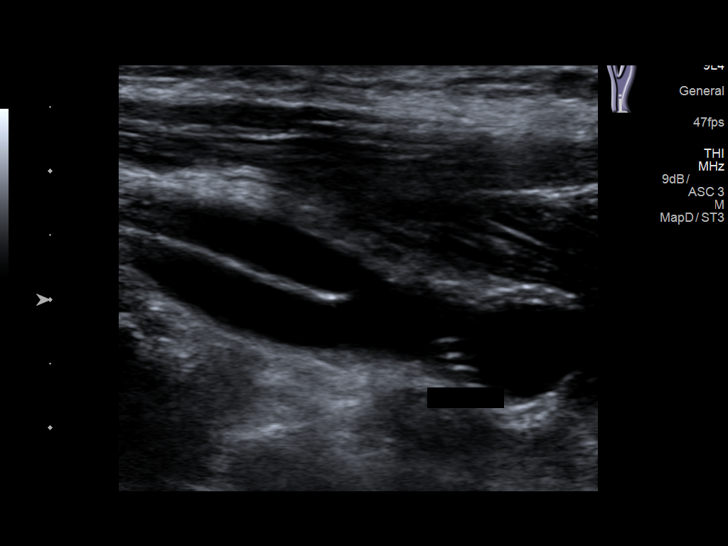
[im 44/68]
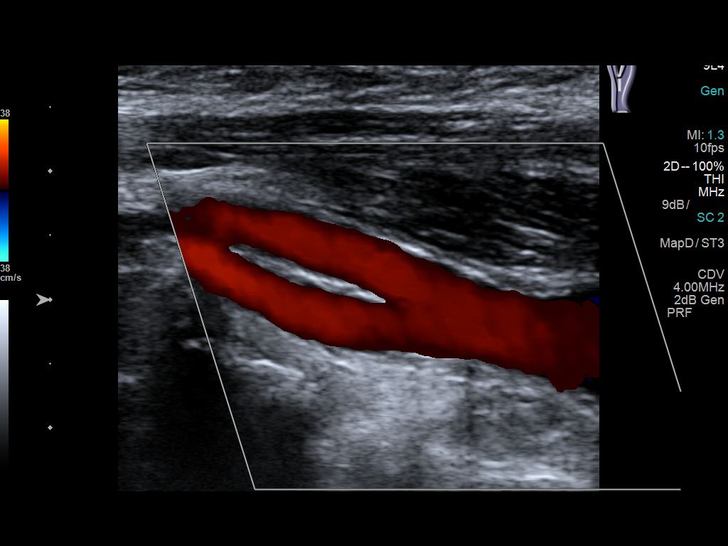
[im 50/68]
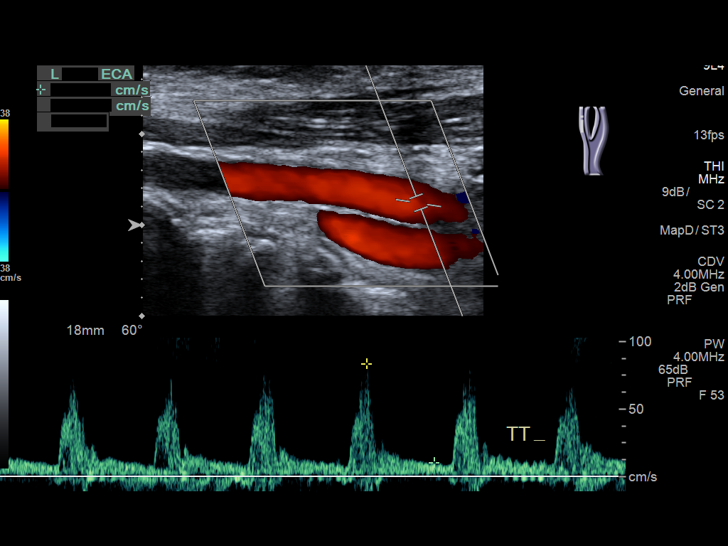
[im 56/68]
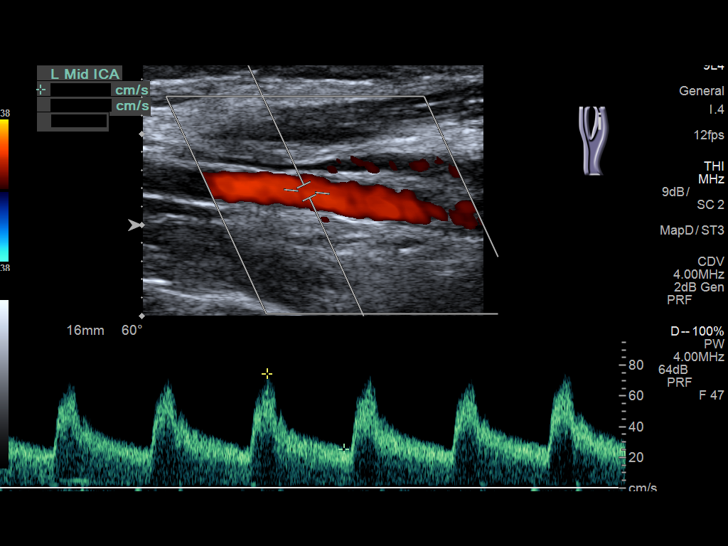
[im 62/68]
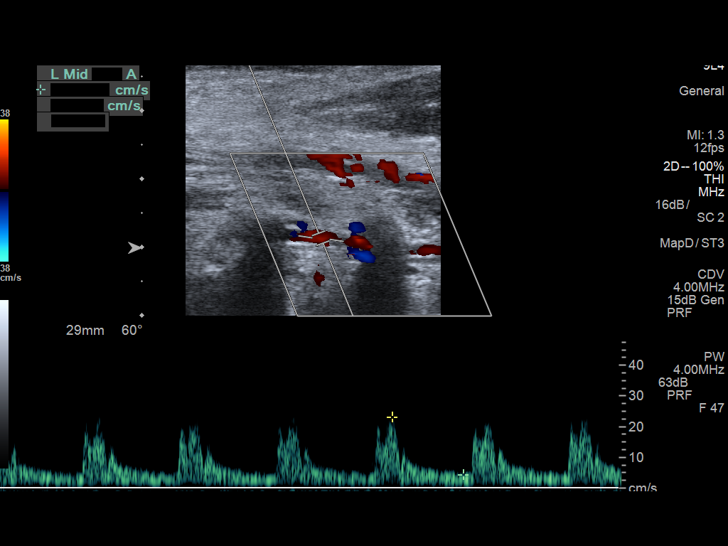
[im 68/68]
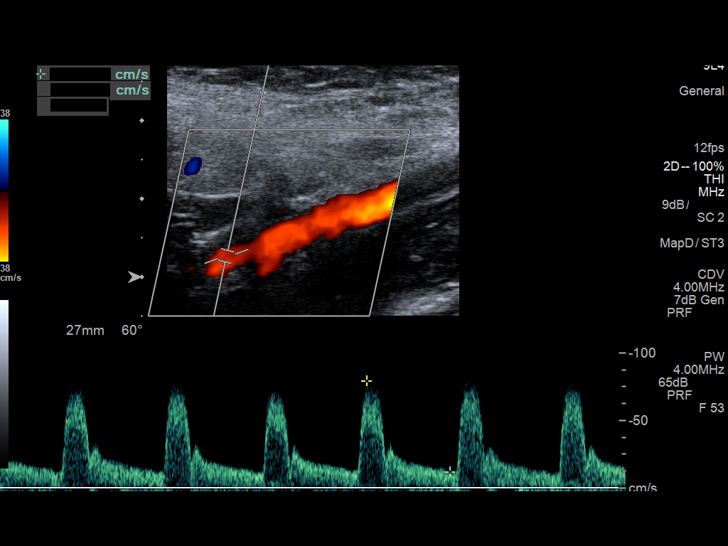

[13 of 24 positions shown; findings below may reference images not displayed]

FINDINGS: Criteria: Quantification of carotid stenosis is based on velocity
parameters that correlate the residual internal carotid diameter
with NASCET-based stenosis levels, using the diameter of the distal
internal carotid lumen as the denominator for stenosis measurement.

The following velocity measurements were obtained:

RIGHT

ICA:  Systolic 79 cm/sec, Diastolic 29 cm/sec

CCA:  70 cm/sec

SYSTOLIC ICA/CCA RATIO:

ECA:  50 cm/sec

LEFT

ICA:  Systolic 75 cm/sec, Diastolic 27 cm/sec

CCA:  145 cm/sec

SYSTOLIC ICA/CCA RATIO:

ECA:  87 cm/sec

Right Brachial SBP: Not acquired

Left Brachial SBP: Not acquired

RIGHT CAROTID ARTERY: No significant calcifications of the right
common carotid artery. Intermediate waveform maintained.
Heterogeneous and partially calcified plaque at the right carotid
bifurcation. No significant lumen shadowing. Low resistance waveform
of the right ICA. No significant tortuosity.

RIGHT VERTEBRAL ARTERY: Antegrade flow with low resistance waveform.

LEFT CAROTID ARTERY: No significant calcifications of the left
common carotid artery. Intermediate waveform maintained.
Heterogeneous and partially calcified plaque at the left carotid
bifurcation without significant lumen shadowing. Low resistance
waveform of the left ICA. No significant tortuosity.

LEFT VERTEBRAL ARTERY:  Antegrade flow with low resistance waveform.
IMPRESSION: Color duplex indicates minimal heterogeneous and calcified plaque,
with no hemodynamically significant stenosis by duplex criteria in
the extracranial cerebrovascular circulation.

## 2019-06-19 DIAGNOSIS — B351 Tinea unguium: Secondary | ICD-10-CM | POA: Diagnosis not present

## 2019-06-19 DIAGNOSIS — N1832 Chronic kidney disease, stage 3b: Secondary | ICD-10-CM | POA: Diagnosis not present

## 2019-06-19 DIAGNOSIS — D631 Anemia in chronic kidney disease: Secondary | ICD-10-CM | POA: Diagnosis not present

## 2019-06-19 DIAGNOSIS — M79674 Pain in right toe(s): Secondary | ICD-10-CM | POA: Diagnosis not present

## 2019-06-19 DIAGNOSIS — N2581 Secondary hyperparathyroidism of renal origin: Secondary | ICD-10-CM | POA: Diagnosis not present

## 2019-06-19 DIAGNOSIS — E1122 Type 2 diabetes mellitus with diabetic chronic kidney disease: Secondary | ICD-10-CM | POA: Diagnosis not present

## 2019-06-19 DIAGNOSIS — M79675 Pain in left toe(s): Secondary | ICD-10-CM | POA: Diagnosis not present

## 2019-06-19 DIAGNOSIS — I10 Essential (primary) hypertension: Secondary | ICD-10-CM | POA: Diagnosis not present

## 2019-06-20 DIAGNOSIS — N184 Chronic kidney disease, stage 4 (severe): Secondary | ICD-10-CM | POA: Diagnosis not present

## 2019-06-20 DIAGNOSIS — E782 Mixed hyperlipidemia: Secondary | ICD-10-CM | POA: Diagnosis not present

## 2019-06-20 DIAGNOSIS — E663 Overweight: Secondary | ICD-10-CM | POA: Diagnosis not present

## 2019-06-20 DIAGNOSIS — I10 Essential (primary) hypertension: Secondary | ICD-10-CM | POA: Diagnosis not present

## 2019-06-20 DIAGNOSIS — E1122 Type 2 diabetes mellitus with diabetic chronic kidney disease: Secondary | ICD-10-CM | POA: Diagnosis not present

## 2019-06-20 DIAGNOSIS — E039 Hypothyroidism, unspecified: Secondary | ICD-10-CM | POA: Diagnosis not present

## 2019-06-28 ENCOUNTER — Encounter: Payer: Self-pay | Admitting: Family Medicine

## 2019-07-04 ENCOUNTER — Other Ambulatory Visit: Payer: Self-pay | Admitting: Family Medicine

## 2019-07-04 ENCOUNTER — Ambulatory Visit: Payer: Medicare Other | Admitting: Cardiovascular Disease

## 2019-07-04 DIAGNOSIS — E039 Hypothyroidism, unspecified: Secondary | ICD-10-CM

## 2019-07-10 ENCOUNTER — Other Ambulatory Visit: Payer: Self-pay | Admitting: Family Medicine

## 2019-07-17 DIAGNOSIS — S7002XD Contusion of left hip, subsequent encounter: Secondary | ICD-10-CM | POA: Diagnosis not present

## 2019-07-17 DIAGNOSIS — S72142D Displaced intertrochanteric fracture of left femur, subsequent encounter for closed fracture with routine healing: Secondary | ICD-10-CM | POA: Diagnosis not present

## 2019-07-24 ENCOUNTER — Other Ambulatory Visit: Payer: Self-pay | Admitting: Family Medicine

## 2019-07-24 NOTE — Telephone Encounter (Signed)
Requested medication (s) are due for refill today: yes  Requested medication (s) are on the active medication list: yes  Last refill:  07/10/2019  Future visit scheduled: yes  Notes to clinic:  this refill cannot be delegated    Requested Prescriptions  Pending Prescriptions Disp Refills   diphenoxylate-atropine (LOMOTIL) 2.5-0.025 MG tablet [Pharmacy Med Name: Diphenoxylate-Atropine 2.5-0.025 MG Oral Tablet] 30 tablet 0    Sig: TAKE 1 TABLET BY MOUTH 1 TO 4 TIMES DAILY FOR DIARRHEA      Not Delegated - Gastroenterology:  Antidiarrheals Failed - 07/24/2019  3:26 PM      Failed - This refill cannot be delegated      Passed - Valid encounter within last 12 months    Recent Outpatient Visits           2 months ago Chest pain, unspecified type   [facility] [name], MD   4 months ago [place], Donald E, MD   5 months ago Chest pain, unspecified type   [facility] [name], MD   5 months ago Recurrent UTI   [place], [place], PA-C   7 months ago Type 2 diabetes mellitus with stage 3a chronic kidney disease, without long-term current use of insulin [facility])   [facility] [name], MD       Future Appointments             In 4 weeks  [facility], [place]   In 4 weeks Fisher, Kirstie Peri, MD [facility], [place]   In 2 months Gollan, Kathlene November, MD [facility], [place]

## 2019-07-25 ENCOUNTER — Encounter: Payer: Self-pay | Admitting: Family Medicine

## 2019-07-29 NOTE — Progress Notes (Signed)
[place]  [phone]) [phone] [phone]) [phone]  ID: Sinda Du OB: 1970/07/01  MR#: [id number]  IOM#:[id number]  Patient Care Team: [name], MD as PCP - General (Family Medicine) [name] Kathlene November, MD as PCP - Cardiology (Cardiology) Vladimir Crofts, MD as Consulting Physician (Neurology) Irene Shipper, MD as Consulting Physician (Gastroenterology) Christene Lye, MD (General Surgery) Concepcion Living, MD as Referring Physician (Neurology) Anthonette Legato, MD as Consulting Physician (Nephrology) Neldon Labella, RN as Case Manager Grayland Ormond, Kathlene November, MD as Consulting Physician (Oncology) Poggi, Marshall Cork, MD as Consulting Physician (Orthopedic Surgery)  CHIEF COMPLAINT: Iron deficiency anemia.  INTERVAL HISTORY: Patient returns to clinic today for repeat laboratory work, further evaluation, and consideration of additional IV Feraheme.  She continues to complain of chronic weakness and fatigue, but otherwise feels well.  She has no neurologic complaints.  She denies any recent fevers or illnesses.  She has a good appetite and denies weight loss.  She has no chest pain, shortness of breath, cough, or hemoptysis.  She denies any nausea, vomiting, constipation, or diarrhea.  She has no melena or hematochezia.  She has no urinary complaints.  Patient otherwise feels well and offers no further specific complaints today.  REVIEW OF SYSTEMS:   Review of Systems  Constitutional: Positive for malaise/fatigue. Negative for fever and weight loss.  Respiratory: Negative.  Negative for cough and shortness of breath.   Cardiovascular: Negative.  Negative for chest pain, palpitations and leg swelling.  Gastrointestinal: Negative.  Negative for blood in stool, diarrhea and melena.  Genitourinary: Negative.  Negative for hematuria.  Musculoskeletal: Negative.  Negative for back pain.  Skin: Negative.  Negative for rash.  Neurological:  Positive for weakness. Negative for dizziness, sensory change, focal weakness and headaches.  Psychiatric/Behavioral: Negative.  The patient is not nervous/anxious.     As per HPI. Otherwise, a complete review of systems is negative.  PAST MEDICAL HISTORY: Past Medical History:  Diagnosis Date  . Acute colitis 01/27/2017  . Acute pyelonephritis   . Anemia    iron deficiency anemia  . Aortic arch aneurysm ([place])   . BRCA negative 2014  . CAD (coronary artery disease)    a. 08/2003 Cath: LAD 30-40-med Rx; b. 11/2014 PCI: LAD 98m(3.25x23 Xience Alpine DES); c. 06/2015 PCI: D1 (2.25x12 Resolute Integrity DES); d. 06/2017 PCI: Patent mLAD stent, D2 95 (PTCA); e. 09/2017 PCI: D2 99ost (CBA); d. 12/2017 Cath: LM nl, LAD 313m80d (small), D1 40ost, D2 95ost, LCX 40p, RCA 40ost/p->Med rx for D2 given restenosis.  . Colitis 06/03/2015  . Colon polyp   . CVA (cerebral vascular accident) ([facility]   Left side weakness.   . Degenerative tear of glenoid labrum of right shoulder 03/15/2017  . Family history of breast cancer    BRCA neg 2014  . Femur fracture, left ([facility]9/10/2018  . Gastric ulcer 04/27/2011  . History of echocardiogram    a. 03/2017 Echo: EF 60-65%, no rwma; b. 02/2018 Echo: EF 60-65%, no rwma. Nl RV fxn. No cardiac source of emboli (admitted w/ stroke).  . Malignant melanoma of skin of scalp ([facility]  . MI, acute, non ST segment elevation ([facility] [place]  . Neuromuscular disorder ([facility]  . S/P drug eluting coronary stent placement 06/04/2015  . Sepsis ([facility]2/14/2019  . [facility]   a. 02/2018 MRI: [id number] acute/early subacute L medial frontal lobe inarct; b. 02/2018 MRA No large vessel  occlusion or aneurysm. Mod to sev L P2 stenosis. thready L vertebral artery, diffusely dzs'd; c. 02/2018 Carotid U/S: <50% bilat ICA dzs.    PAST SURGICAL HISTORY: Past Surgical History:  Procedure Laterality Date  . APPENDECTOMY    . CARDIAC CATHETERIZATION N/A 11/09/2014   Procedure: Coronary Angiography;   Surgeon: Minna Merritts, MD;  Location: [place] CV LAB;  Service: Cardiovascular;  Laterality: N/A;  . CARDIAC CATHETERIZATION N/A 11/12/2014   Procedure: Coronary Stent Intervention;  Surgeon: Isaias Cowman, MD;  Location: [place] CV LAB;  Service: Cardiovascular;  Laterality: N/A;  . CARDIAC CATHETERIZATION N/A 04/18/2015   Procedure: Left Heart Cath and Coronary Angiography;  Surgeon: Minna Merritts, MD;  Location: [place] CV LAB;  Service: Cardiovascular;  Laterality: N/A;  . CARDIAC CATHETERIZATION Left 06/04/2015   Procedure: Left Heart Cath and Coronary Angiography;  Surgeon: Wellington Hampshire, MD;  Location: [place] CV LAB;  Service: Cardiovascular;  Laterality: Left;  . CARDIAC CATHETERIZATION N/A 06/04/2015   Procedure: Coronary Stent Intervention;  Surgeon: Wellington Hampshire, MD;  Location: [place] CV LAB;  Service: Cardiovascular;  Laterality: N/A;  . CESAREAN SECTION  2001  . CHOLECYSTECTOMY N/A 11/18/2016   Procedure: LAPAROSCOPIC CHOLECYSTECTOMY WITH INTRAOPERATIVE CHOLANGIOGRAM;  Surgeon: Christene Lye, MD;  Location: [facility];  Service: General;  Laterality: N/A;  . COLONOSCOPY WITH PROPOFOL N/A 04/27/2016   Procedure: COLONOSCOPY WITH PROPOFOL;  Surgeon: [name], MD;  Location: [place];  Service: Endoscopy;  Laterality: N/A;  . COLONOSCOPY WITH PROPOFOL N/A 01/12/2018   Procedure: COLONOSCOPY WITH PROPOFOL;  Surgeon: Toledo, Benay Pike, MD;  Location: [facility];  Service: Endoscopy;  Laterality: N/A;  . CORONARY ANGIOPLASTY    . CORONARY BALLOON ANGIOPLASTY N/A 06/29/2017   Procedure: CORONARY BALLOON ANGIOPLASTY;  Surgeon: Wellington Hampshire, MD;  Location: [place] CV LAB;  Service: Cardiovascular;  Laterality: N/A;  . CORONARY BALLOON ANGIOPLASTY N/A 09/20/2017   Procedure: CORONARY BALLOON ANGIOPLASTY;  Surgeon: Wellington Hampshire, MD;  Location: [place] CV LAB;  Service: Cardiovascular;  Laterality: N/A;    . DILATION AND CURETTAGE OF UTERUS    . ESOPHAGOGASTRODUODENOSCOPY (EGD) WITH PROPOFOL N/A 09/14/2014   Procedure: ESOPHAGOGASTRODUODENOSCOPY (EGD) WITH PROPOFOL;  Surgeon: Josefine Class, MD;  Location: [facility] ENDOSCOPY;  Service: Endoscopy;  Laterality: N/A;  . ESOPHAGOGASTRODUODENOSCOPY (EGD) WITH PROPOFOL N/A 04/27/2016   Procedure: ESOPHAGOGASTRODUODENOSCOPY (EGD) WITH PROPOFOL;  Surgeon: [name], MD;  Location: [place];  Service: Endoscopy;  Laterality: N/A;  Diabetic - oral meds  . ESOPHAGOGASTRODUODENOSCOPY (EGD) WITH PROPOFOL N/A 01/12/2018   Procedure: ESOPHAGOGASTRODUODENOSCOPY (EGD) WITH PROPOFOL;  Surgeon: [place], Benay Pike, MD;  Location: [facility];  Service: Endoscopy;  Laterality: N/A;  . ESOPHAGOGASTRODUODENOSCOPY (EGD) WITH PROPOFOL N/A 04/11/2019   Procedure: ESOPHAGOGASTRODUODENOSCOPY (EGD) WITH PROPOFOL;  Surgeon: [name], MD;  Location: [facility]OS[facility];  Service: Endoscopy;  Laterality: N/A;  . GASTRIC BYPASS  09/2009   Hopkins (IM) NAIL INTERTROCHANTERIC Left 10/13/2018   Procedure: INTRAMEDULLARY (IM) NAIL INTERTROCHANTRIC;  Surgeon: Leandrew Koyanagi, MD;  Location: [place];  Service: Orthopedics;  Laterality: Left;  . Left Carotid to sublcavian artery bypass w/ subclavian artery ligation     a. Performed @ [place].  . LEFT HEART CATH AND CORONARY ANGIOGRAPHY Left 06/29/2017   Procedure: LEFT HEART CATH AND CORONARY ANGIOGRAPHY;  Surgeon: Wellington Hampshire, MD;  Location: [place] CV LAB;  Service: Cardiovascular;  Laterality: Left;  . LEFT  HEART CATH AND CORONARY ANGIOGRAPHY N/A 09/20/2017   Procedure: LEFT HEART CATH AND CORONARY ANGIOGRAPHY;  Surgeon: Wellington Hampshire, MD;  Location: [place] CV LAB;  Service: Cardiovascular;  Laterality: N/A;  . LEFT HEART CATH AND CORONARY ANGIOGRAPHY N/A 12/20/2017   Procedure: LEFT HEART CATH AND CORONARY ANGIOGRAPHY;  Surgeon: Wellington Hampshire, MD;  Location: [place] CV LAB;  Service: Cardiovascular;  Laterality: N/A;  . LEFT HEART CATH AND CORONARY ANGIOGRAPHY N/A 04/20/2019   Procedure: LEFT HEART CATH AND CORONARY ANGIOGRAPHY possible PCI;  Surgeon: Minna Merritts, MD;  Location: [place] CV LAB;  Service: Cardiovascular;  Laterality: N/A;  . MELANOMA EXCISION  2016   Dr. Evorn Gong  . [place]  2002  . RIGHT OOPHORECTOMY    . SHOULDER ARTHROSCOPY WITH OPEN ROTATOR CUFF REPAIR Right 01/07/2016   Procedure: SHOULDER ARTHROSCOPY WITH DEBRIDMENT, SUBACHROMIAL DECOMPRESSION;  Surgeon: Corky Mull, MD;  Location: [facility];  Service: Orthopedics;  Laterality: Right;  . SHOULDER ARTHROSCOPY WITH OPEN ROTATOR CUFF REPAIR Right 03/16/2017   Procedure: SHOULDER ARTHROSCOPY WITH OPEN ROTATOR CUFF REPAIR POSSIBLE BICEPS TENODESIS;  Surgeon: Corky Mull, MD;  Location: [facility];  Service: Orthopedics;  Laterality: Right;  . TRIGGER FINGER RELEASE Right     [place]    FAMILY HISTORY Family History  Problem Relation Age of Onset  . Hypertension Mother   . Anxiety disorder Mother   . Depression Mother   . Bipolar disorder Mother   . Heart disease Mother        No details  . Hyperlipidemia Mother   . Kidney disease Father   . Heart disease Father 39  . Hypertension Father   . Diabetes Father   . Stroke Father   . Colon cancer Father        dx in his 13's  . Anxiety disorder Father   . Depression Father   . Skin cancer Father   . Kidney disease Sister   . Thyroid nodules Sister   . Hypertension Sister   . Hypertension Sister   . Diabetes Sister   . Hyperlipidemia Sister   . Depression Sister   . Breast cancer Maternal Aunt 57  . Breast cancer Maternal Aunt 72  . Ovarian cancer Cousin   . Colon cancer Cousin   . Kidney cancer Cousin   . Breast cancer Other   . Bladder Cancer Neg Hx        ADVANCED DIRECTIVES:    HEALTH MAINTENANCE: Social History   Tobacco Use  . Smoking status: Former Smoker    Types:  Cigarettes    Quit date: 08/31/1994    Years since quitting: 24.9  . Smokeless tobacco: Never Used  . Tobacco comment: quit 28 years ago  Vaping Use  . Vaping Use: Never used  Substance Use Topics  . Alcohol use: No    Alcohol/week: 0.0 standard drinks  . Drug use: No     Allergies  Allergen Reactions  . Lipitor [Atorvastatin] Other (See Comments)    Leg pains  . Tramadol Other (See Comments)    Mouth feels like it's on fire    Current Outpatient Medications  Medication Sig Dispense Refill  . ALPRAZolam (XANAX) 1 MG tablet Take 1 tablet (1 mg total) by mouth 2 (two) times daily as needed for anxiety. TAKE 1 TABLET BY MOUT 60 tablet 1  . amLODipine (NORVASC) 10 MG tablet Take 5 mg by mouth daily.     Marland Kitchen  amoxicillin (AMOXIL) 500 MG capsule Take 500 mg by mouth 3 (three) times daily.    Marland Kitchen aspirin 81 MG EC tablet Take 81 mg by mouth daily.     Marland Kitchen buPROPion (WELLBUTRIN XL) 300 MG 24 hr tablet Take 1 tablet (300 mg total) by mouth daily. 90 tablet 1  . chlorhexidine (PERIDEX) 0.12 % solution     . diphenoxylate-atropine (LOMOTIL) 2.5-0.025 MG tablet TAKE 1 TABLET BY MOUTH 1 TO 4 TIMES DAILY FOR DIARRHEA 30 tablet 0  . EUTHYROX 25 MCG tablet TAKE 1 TABLET BY MOUTH ONCE DAILY BEFORE BREAKFAST 30 tablet 0  . gabapentin (NEURONTIN) 300 MG capsule Take 1 capsule (300 mg total) by mouth 2 (two) times daily. 180 capsule 4  . Galcanezumab-gnlm (EMGALITY) 120 MG/ML SOAJ As needed    . isosorbide mononitrate (IMDUR) 60 MG 24 hr tablet Take 1 tablet (60 mg total) by mouth daily. 90 tablet 1  . losartan (COZAAR) 25 MG tablet Take 25 mg by mouth daily.    . metFORMIN (GLUCOPHAGE-XR) 500 MG 24 hr tablet Take 500 mg by mouth every morning.    . nitroGLYCERIN (NITROSTAT) 0.4 MG SL tablet Place 1 tablet (0.4 mg total) under the tongue every 5 (five) minutes as needed for chest pain. 25 tablet 2  . oxyCODONE-acetaminophen (PERCOCET) 5-325 MG tablet Take 1 tablet by mouth every 8 (eight) hours as needed.  20 tablet 0  . pantoprazole (PROTONIX) 40 MG tablet Take 1 tablet by mouth twice daily 180 tablet 1  . traZODone (DESYREL) 100 MG tablet Take 100 mg by mouth at bedtime as needed for sleep.     Marland Kitchen venlafaxine XR (EFFEXOR-XR) 75 MG 24 hr capsule TAKE 1 CAPSULE BY MOUTH ONCE DAILY WITH BREAKFAST 90 capsule 0   No current facility-administered medications for this visit.    OBJECTIVE: Vitals:   08/04/19 1316  BP: 135/87  Pulse: 86  Temp: (!) 97.3 F (36.3 C)  SpO2: 100%     Body mass index is 26.45 kg/m.    ECOG FS:0 - Asymptomatic  General: Well-developed, well-nourished, no acute distress. Eyes: Pink conjunctiva, anicteric sclera. HEENT: Normocephalic, moist mucous membranes. Lungs: No audible wheezing or coughing. Heart: Regular rate and rhythm. Abdomen: Soft, nontender, no obvious distention. Musculoskeletal: No edema, cyanosis, or clubbing. Neuro: Alert, answering all questions appropriately. Cranial nerves grossly intact. Skin: No rashes or petechiae noted. Psych: Normal affect.   LAB RESULTS:  Lab Results  Component Value Date   NA 132 (L) 06/09/2019   K 4.6 06/09/2019   CL 102 06/09/2019   CO2 23 06/09/2019   GLUCOSE 132 (H) 06/09/2019   BUN 21 (H) 06/09/2019   CREATININE 1.55 (H) 06/09/2019   CALCIUM 8.2 (L) 06/09/2019   PROT 5.8 (L) 06/09/2019   ALBUMIN 2.9 (L) 06/09/2019   AST 20 06/09/2019   ALT 18 06/09/2019   ALKPHOS 115 06/09/2019   BILITOT 0.8 06/09/2019   GFRNONAA 39 (L) 06/09/2019   GFRAA 45 (L) 06/09/2019    Lab Results  Component Value Date   WBC 7.9 08/03/2019   NEUTROABS 5.4 08/03/2019   HGB 11.1 (L) 08/03/2019   HCT 35.0 (L) 08/03/2019   MCV 86.8 08/03/2019   PLT 175 08/03/2019   Lab Results  Component Value Date   IRON 38 08/03/2019   TIBC 185 (L) 08/03/2019   IRONPCTSAT 21 08/03/2019    Lab Results  Component Value Date   FERRITIN 92 08/03/2019     STUDIES: No results found.  ASSESSMENT: Iron deficiency  anemia.  PLAN:    1. Iron deficiency anemia: Essentially resolved.  Patient's hemoglobin is mildly decreased at 11.1, but stable.  Iron stores continue to be within normal limits.  Patient likely has poor absorption given her history of gastric bypass surgery.  Previously, her entire anemia work-up was either negative or within normal limits.  She last received 510 mg IV Feraheme on February 15, 2019.  She does not require additional treatment today.  Return to clinic in 4 months with repeat laboratory work, further evaluation, and continuation of treatment if necessary. 2.  Diarrhea/esophageal stricture: Patient does not complain of this today.  Continue evaluation and treatment per GI.     3.  Chronic renal insufficiency: Patient's most recent creatinine on Jun 09, 2019 was reported at 1.55 which appears to be her baseline.   Patient expressed understanding and was in agreement with this plan. She also understands that She can call clinic at any time with any questions, concerns, or complaints.   Lloyd Huger, MD   08/05/2019 7:54 AM

## 2019-08-01 ENCOUNTER — Other Ambulatory Visit: Payer: Self-pay | Admitting: Family Medicine

## 2019-08-01 DIAGNOSIS — E039 Hypothyroidism, unspecified: Secondary | ICD-10-CM

## 2019-08-03 ENCOUNTER — Inpatient Hospital Stay: Payer: Medicare Other | Attending: Oncology

## 2019-08-03 ENCOUNTER — Other Ambulatory Visit: Payer: Self-pay

## 2019-08-03 DIAGNOSIS — N189 Chronic kidney disease, unspecified: Secondary | ICD-10-CM | POA: Diagnosis not present

## 2019-08-03 DIAGNOSIS — D509 Iron deficiency anemia, unspecified: Secondary | ICD-10-CM | POA: Diagnosis not present

## 2019-08-03 LAB — CBC WITH DIFFERENTIAL/PLATELET
Abs Immature Granulocytes: 0.02 10*3/uL (ref 0.00–0.07)
Basophils Absolute: 0.1 10*3/uL (ref 0.0–0.1)
Basophils Relative: 1 %
Eosinophils Absolute: 0.3 10*3/uL (ref 0.0–0.5)
Eosinophils Relative: 3 %
HCT: 35 % — ABNORMAL LOW (ref 36.0–46.0)
Hemoglobin: 11.1 g/dL — ABNORMAL LOW (ref 12.0–15.0)
Immature Granulocytes: 0 %
Lymphocytes Relative: 21 %
Lymphs Abs: 1.6 10*3/uL (ref 0.7–4.0)
MCH: 27.5 pg (ref 26.0–34.0)
MCHC: 31.7 g/dL (ref 30.0–36.0)
MCV: 86.8 fL (ref 80.0–100.0)
Monocytes Absolute: 0.5 10*3/uL (ref 0.1–1.0)
Monocytes Relative: 6 %
Neutro Abs: 5.4 10*3/uL (ref 1.7–7.7)
Neutrophils Relative %: 69 %
Platelets: 175 10*3/uL (ref 150–400)
RBC: 4.03 MIL/uL (ref 3.87–5.11)
RDW: 13.2 % (ref 11.5–15.5)
WBC: 7.9 10*3/uL (ref 4.0–10.5)
nRBC: 0 % (ref 0.0–0.2)

## 2019-08-03 LAB — IRON AND TIBC
Iron: 38 ug/dL (ref 28–170)
Saturation Ratios: 21 % (ref 10.4–31.8)
TIBC: 185 ug/dL — ABNORMAL LOW (ref 250–450)
UIBC: 147 ug/dL

## 2019-08-03 LAB — FERRITIN: Ferritin: 92 ng/mL (ref 11–307)

## 2019-08-04 ENCOUNTER — Inpatient Hospital Stay (HOSPITAL_BASED_OUTPATIENT_CLINIC_OR_DEPARTMENT_OTHER): Payer: Medicare Other | Admitting: Oncology

## 2019-08-04 ENCOUNTER — Inpatient Hospital Stay: Payer: Medicare Other

## 2019-08-04 ENCOUNTER — Other Ambulatory Visit: Payer: Self-pay | Admitting: Family Medicine

## 2019-08-04 ENCOUNTER — Encounter: Payer: Self-pay | Admitting: Oncology

## 2019-08-04 VITALS — BP 135/87 | HR 86 | Temp 97.3°F | Wt 144.6 lb

## 2019-08-04 DIAGNOSIS — D509 Iron deficiency anemia, unspecified: Secondary | ICD-10-CM | POA: Diagnosis not present

## 2019-08-04 DIAGNOSIS — I255 Ischemic cardiomyopathy: Secondary | ICD-10-CM

## 2019-08-04 DIAGNOSIS — F333 Major depressive disorder, recurrent, severe with psychotic symptoms: Secondary | ICD-10-CM

## 2019-08-04 DIAGNOSIS — N189 Chronic kidney disease, unspecified: Secondary | ICD-10-CM | POA: Diagnosis not present

## 2019-08-04 NOTE — Telephone Encounter (Signed)
Requested Prescriptions  Pending Prescriptions Disp Refills   venlafaxine XR (EFFEXOR-XR) 75 MG 24 hr capsule [Pharmacy Med Name: Venlafaxine HCl ER 75 MG Oral Capsule Extended Release 24 Hour] 90 capsule 0    Sig: TAKE 1 CAPSULE BY MOUTH ONCE DAILY WITH BREAKFAST     Psychiatry: Antidepressants - SNRI - desvenlafaxine & venlafaxine Passed - 08/04/2019  9:45 AM      Passed - LDL in normal range and within 360 days    Ldl Cholesterol, Calc  Date Value Ref Range Status  12/30/2013 117 (H) 0 - 100 mg/dL Final   LDL Cholesterol (Calc)  Date Value Ref Range Status  12/08/2016 103 (H) mg/dL (calc) Final    Comment:    Reference range: <100 . Desirable range <100 mg/dL for primary prevention;   <70 mg/dL for patients with CHD or diabetic patients  with > or = 2 CHD risk factors. Marland Kitchen LDL-C is now calculated using the [name]-Hopkins  calculation, which is a validated novel method providing  better accuracy than the Friedewald equation in the  estimation of LDL-C.  Cresenciano Genre et al. Annamaria Helling. 8841;660(63): 2061-2068  (http://education.[facility].com/faq/FAQ164)    LDL Cholesterol  Date Value Ref Range Status  04/19/2019 76 0 - 99 mg/dL Final    Comment:           Total Cholesterol/HDL:CHD Risk Coronary Heart Disease Risk Table                     Men   Women  1/2 Average Risk   3.4   3.3  Average Risk       5.0   4.4  2 X Average Risk   9.6   7.1  3 X Average Risk  23.4   11.0        Use the calculated Patient Ratio above and the CHD Risk Table to determine the patient's CHD Risk.        ATP III CLASSIFICATION (LDL):  <100     mg/dL   Optimal  100-129  mg/dL   Near or Above                    Optimal  130-159  mg/dL   Borderline  160-189  mg/dL   High  >190     mg/dL   Very High Performed at [facility] [address] [place], [place] [address]    Direct LDL  Date Value Ref Range Status  01/26/2011 83.3 mg/dL Final    Comment:    Optimal:  <100 mg/dLNear  or Above Optimal:  100-129 mg/dLBorderline High:  130-159 mg/dLHigh:  160-189 mg/dLVery High:  >190 mg/dL         Passed - Total Cholesterol in normal range and within 360 days    Cholesterol, Total  Date Value Ref Range Status  05/28/2016 159 100 - 199 mg/dL Final   Cholesterol  Date Value Ref Range Status  04/19/2019 125 0 - 200 mg/dL Final  12/30/2013 197 0 - 200 mg/dL Final         Passed - Triglycerides in normal range and within 360 days    Triglycerides  Date Value Ref Range Status  04/19/2019 75 <150 mg/dL Final  12/30/2013 261 (H) 0 - 200 mg/dL Final         Passed - Completed PHQ-2 or PHQ-9 in the last 360 days.      Passed - Last BP in normal range  BP Readings from Last 1 Encounters:  06/09/19 116/71         Passed - Valid encounter within last 6 months    Recent Outpatient Visits          3 months ago Chest pain, unspecified type   [facility] [name], MD   4 months ago [place], Donald E, MD   5 months ago Chest pain, unspecified type   [facility] [name], MD   6 months ago Recurrent UTI   [place], PA-C   7 months ago Type 2 diabetes mellitus with stage 3a chronic kidney disease, without long-term current use of insulin [facility])   [facility] [name], MD      Future Appointments            In 2 weeks  [facility], [place]   In 2 weeks Caryn Section, Kirstie Peri, MD [facility], [place]   In 2 months Gollan, Kathlene November, MD [facility], [place]

## 2019-08-07 ENCOUNTER — Other Ambulatory Visit: Payer: Self-pay

## 2019-08-07 ENCOUNTER — Ambulatory Visit
Admission: EM | Admit: 2019-08-07 | Discharge: 2019-08-07 | Disposition: A | Discharge status: Home / Self Care | Payer: Medicare Other | Attending: Emergency Medicine | Admitting: Emergency Medicine

## 2019-08-07 ENCOUNTER — Ambulatory Visit (INDEPENDENT_AMBULATORY_CARE_PROVIDER_SITE_OTHER): Payer: Medicare Other

## 2019-08-07 ENCOUNTER — Encounter: Payer: Self-pay | Admitting: Emergency Medicine

## 2019-08-07 DIAGNOSIS — W19XXXA Unspecified fall, initial encounter: Secondary | ICD-10-CM

## 2019-08-07 DIAGNOSIS — S60221A Contusion of right hand, initial encounter: Secondary | ICD-10-CM | POA: Diagnosis not present

## 2019-08-07 DIAGNOSIS — M25531 Pain in right wrist: Secondary | ICD-10-CM

## 2019-08-07 NOTE — Discharge Instructions (Addendum)
Wrist brace for the next 3 days for support as needed.  Ice.  Elevate.  Rest. Drink plenty of fluids.   Follow-up with orthopedic as needed for continued pain.  Follow up with your primary care physician this week as needed. Return to Urgent care for new or worsening concerns.

## 2019-08-07 NOTE — ED Provider Notes (Signed)
[facility] ____________________________________________  Time seen: Approximately 9:39 AM  I have reviewed the triage vital signs and the nursing notes.   HISTORY  Chief Complaint Fall (DOI 08/06/19) and Hand Injury (right)   HPI [name] is a 49 y.o. female presenting for evaluation of right hand and right wrist pain.  States yesterday she was going up 2 steps in her mother's dog got under her feet causing her to trip and fall forward.  States catch herself with her right hand and hit right arm.  Reports continued right hand pain.  States difficulty grasping objects with the right hand.  Denies loss of consciousness, vision changes, headache.  Previous left hip fracture, denies any hip injury from this.  Has rest of the area.  Denies other aggravating alleviating factors.  Denies other injury.  [name], MD : PCP   Past Medical History:  Diagnosis Date  . Acute colitis 01/27/2017  . Acute pyelonephritis   . Anemia    iron deficiency anemia  . Aortic arch aneurysm ([place])   . BRCA negative 2014  . CAD (coronary artery disease)    a. 08/2003 Cath: LAD 30-40-med Rx; b. 11/2014 PCI: LAD 62m(3.25x23 Xience Alpine DES); c. 06/2015 PCI: D1 (2.25x12 Resolute Integrity DES); d. 06/2017 PCI: Patent mLAD stent, D2 95 (PTCA); e. 09/2017 PCI: D2 99ost (CBA); d. 12/2017 Cath: LM nl, LAD 332m80d (small), D1 40ost, D2 95ost, LCX 40p, RCA 40ost/p->Med rx for D2 given restenosis.  . Colitis 06/03/2015  . Colon polyp   . CVA (cerebral vascular accident) (HCEtowah   Left side weakness.   . Degenerative tear of glenoid labrum of right shoulder 03/15/2017  . Family history of breast cancer    BRCA neg 2014  . Femur fracture, left (HCGreycliff9/10/2018  . Gastric ulcer 04/27/2011  . History of echocardiogram    a. 03/2017 Echo: EF 60-65%, no rwma; b. 02/2018 Echo: EF 60-65%, no rwma. Nl RV fxn. No cardiac source of emboli (admitted w/ stroke).  . Malignant melanoma of skin of scalp  (HCPrince's Lakes  . MI, acute, non ST segment elevation ([facility]CHelena [facility]  . Neuromuscular disorder (HCMassanutten  . S/P drug eluting coronary stent placement 06/04/2015  . Sepsis (HCSt. Paul2/14/2019  . St[facility] ([facility]   a. 02/2018 MRI: 40m88mate acute/early subacute L medial frontal lobe inarct; b. 02/2018 MRA No large vessel occlusion or aneurysm. Mod to sev L P2 stenosis. thready L vertebral artery, diffusely dzs'd; c. 02/2018 Carotid U/S: <50% bilat ICA dzs.    Patient Active Problem List   Diagnosis Date Noted  . Family history of breast cancer 05/23/2019  . Family history of colon cancer 05/23/2019  . Abnormal LFTs 04/18/2019  . Esophageal dysphagia   . Gastrointestinal tract imaging abnormality   . Trochanteric bursitis, left hip 03/17/2019  . Malignant neoplasm of vulva, unspecified (HCCLevelock2/06/2019  . Peripheral vascular disease, unspecified (HCCKansas2/06/2019  . Paroxysmal supraventricular tachycardia (HCCSomerset2/06/2019  . Contusion of left hip 03/06/2019  . Chest pain 01/27/2019  . Melena   . Hypertensive urgency   . Recurrent chest pain 01/19/2019  . Depression, major, single episode, moderate (HCCOld Mystic1/07/2018  . Closed nondisplaced intertrochanteric fracture of left femur (HCCMagalia9/10/2018  . Hypotension 06/26/2018  . Autonomic neuropathy 03/24/2018  . Acute delirium 03/03/2018  . H/O gastric bypass 03/02/2018  . Hypertension associated with stage 3 chronic kidney disease due to type 2 diabetes mellitus (HCCCaledonia1/19/2020  .  SI (sacroiliac) joint dysfunction 12/02/2017  . Effort angina ([place]) 06/29/2017  . Unstable angina ([place]) 06/24/2017  . Syncope 04/09/2017  . Insomnia 03/18/2017  . Ischemic cardiomyopathy   . Arthritis   . Anxiety   . Tendinitis of upper biceps tendon of right shoulder 03/16/2017  . Hx of colonic polyps   . H/O medication noncompliance 12/14/2015  . CAD in native artery   . Hypertensive heart disease   . Type 2 DM with CKD stage 4 and hypertension ([place]) 06/05/2015  . Status post  bariatric surgery 06/04/2015  . Carotid stenosis 04/30/2015  . Stable angina pectoris ([place]) 04/17/2015  . Iron deficiency anemia 03/22/2015  . Vitamin B12 deficiency 02/18/2015  . Misuse of medications for pain 02/18/2015  . Major depressive disorder, recurrent, severe with psychotic features ([place]) 02/15/2015  . Helicobacter pylori infection 11/23/2014  . Hemiparesis, left ([place]) 11/23/2014  . Malignant melanoma ([place]) 08/25/2014  . Chronic systolic CHF (congestive heart failure) ([place])   . Incomplete bladder emptying 07/12/2014  . Hypothyroidism 12/30/2013  . Aberrant subclavian artery 11/17/2013  . Multiple sclerosis ([place]) 11/02/2013  . History of CVA with residual deficit 06/20/2013  . Headache, migraine 05/29/2013  . Hyperlipidemia   . GERD (gastroesophageal reflux disease)   . Neuropathy ([place]) 01/02/2011  . Stroke ([place]) 06/21/2008  . Depression with anxiety 05/01/2008  . Essential hypertension 05/01/2008    Past Surgical History:  Procedure Laterality Date  . APPENDECTOMY    . CARDIAC CATHETERIZATION N/A 11/09/2014   Procedure: Coronary Angiography;  Surgeon: Minna Merritts, MD;  Location: [place] [facility];  Service: Cardiovascular;  Laterality: N/A;  . CARDIAC CATHETERIZATION N/A 11/12/2014   Procedure: Coronary Stent Intervention;  Surgeon: Isaias Cowman, MD;  Location: [place] [facility];  Service: Cardiovascular;  Laterality: N/A;  . CARDIAC CATHETERIZATION N/A 04/18/2015   Procedure: Left Heart Cath and Coronary Angiography;  Surgeon: Minna Merritts, MD;  Location: [place] [facility];  Service: Cardiovascular;  Laterality: N/A;  . CARDIAC CATHETERIZATION Left 06/04/2015   Procedure: Left Heart Cath and Coronary Angiography;  Surgeon: Wellington Hampshire, MD;  Location: [place] [facility];  Service: Cardiovascular;  Laterality: Left;  . CARDIAC CATHETERIZATION N/A 06/04/2015   Procedure: Coronary Stent Intervention;  Surgeon: Wellington Hampshire, MD;  Location: [place] [facility];  Service: Cardiovascular;  Laterality: N/A;  . CESAREAN SECTION  2001  . CHOLECYSTECTOMY N/A 11/18/2016   Procedure: LAPAROSCOPIC CHOLECYSTECTOMY WITH INTRAOPERATIVE CHOLANGIOGRAM;  Surgeon: Christene Lye, MD;  Location: [facility];  Service: General;  Laterality: N/A;  . COLONOSCOPY WITH PROPOFOL N/A 04/27/2016   Procedure: COLONOSCOPY WITH PROPOFOL;  Surgeon: [name], MD;  Location: [place];  Service: Endoscopy;  Laterality: N/A;  . COLONOSCOPY WITH PROPOFOL N/A 01/12/2018   Procedure: COLONOSCOPY WITH PROPOFOL;  Surgeon: Tol[place], Benay Pike, MD;  Location: [facility];  Service: Endoscopy;  Laterality: N/A;  . CORONARY ANGIOPLASTY    . CORONARY BALLOON ANGIOPLASTY N/A 06/29/2017   Procedure: CORONARY BALLOON ANGIOPLASTY;  Surgeon: Wellington Hampshire, MD;  Location: [place] [facility];  Service: Cardiovascular;  Laterality: N/A;  . CORONARY BALLOON ANGIOPLASTY N/A 09/20/2017   Procedure: CORONARY BALLOON ANGIOPLASTY;  Surgeon: Wellington Hampshire, MD;  Location: [place] [facility];  Service: Cardiovascular;  Laterality: N/A;  . DILATION AND CURETTAGE OF UTERUS    . ESOPHAGOGASTRODUODENOSCOPY (EGD) WITH PROPOFOL N/A 09/14/2014   Procedure: ESOPHAGOGASTRODUODENOSCOPY (EGD) WITH PROPOFOL;  Surgeon: Josefine Class, MD;  [facility]  END[facility];  Service: Endoscopy;  Laterality: N/A;  . ESOPHAGOGASTRODUODENOSCOPY (EGD) WITH PROPOFOL N/A 04/27/2016   Procedure: ESOPHAGOGASTRODUODENOSCOPY (EGD) WITH PROPOFOL;  Surgeon: [name], MD;  Location: [place];  Service: Endoscopy;  Laterality: N/A;  Diabetic - oral meds  . ESOPHAGOGASTRODUODENOSCOPY (EGD) WITH PROPOFOL N/A 01/12/2018   Procedure: ESOPHAGOGASTRODUODENOSCOPY (EGD) WITH PROPOFOL;  Surgeon: Tol[place], Benay Pike, MD;  Location: [facility];  Service: Endoscopy;  Laterality: N/A;  . ESOPHAGOGASTRODUODENOSCOPY (EGD) WITH PROPOFOL N/A 04/11/2019   Procedure: ESOPHAGOGASTRODUODENOSCOPY (EGD)  WITH PROPOFOL;  Surgeon: [name], MD;  Location: [facility];  Service: Endoscopy;  Laterality: N/A;  . GASTRIC BYPASS  09/2009   [place] (IM) NAIL INTERTROCHANTERIC Left 10/13/2018   Procedure: INTRAMEDULLARY (IM) NAIL INTERTROCHANTRIC;  Surgeon: Leandrew Koyanagi, MD;  Location: [place];  Service: Orthopedics;  Laterality: Left;  . Left Carotid to sublcavian artery bypass w/ subclavian artery ligation     a. Performed @ [facility].  . LEFT HEART CATH AND CORONARY ANGIOGRAPHY Left 06/29/2017   Procedure: LEFT HEART CATH AND CORONARY ANGIOGRAPHY;  Surgeon: Wellington Hampshire, MD;  Location: [place] [facility];  Service: Cardiovascular;  Laterality: Left;  . LEFT HEART CATH AND CORONARY ANGIOGRAPHY N/A 09/20/2017   Procedure: LEFT HEART CATH AND CORONARY ANGIOGRAPHY;  Surgeon: Wellington Hampshire, MD;  Location: [place] [facility];  Service: Cardiovascular;  Laterality: N/A;  . LEFT HEART CATH AND CORONARY ANGIOGRAPHY N/A 12/20/2017   Procedure: LEFT HEART CATH AND CORONARY ANGIOGRAPHY;  Surgeon: Wellington Hampshire, MD;  Location: [place] [facility];  Service: Cardiovascular;  Laterality: N/A;  . LEFT HEART CATH AND CORONARY ANGIOGRAPHY N/A 04/20/2019   Procedure: LEFT HEART CATH AND CORONARY ANGIOGRAPHY possible PCI;  Surgeon: Minna Merritts, MD;  Location: [place] [facility];  Service: Cardiovascular;  Laterality: N/A;  . MELANOMA EXCISION  2016   Dr. Evorn Gong  . [place]  2002  . RIGHT OOPHORECTOMY    . SHOULDER ARTHROSCOPY WITH OPEN ROTATOR CUFF REPAIR Right 01/07/2016   Procedure: SHOULDER ARTHROSCOPY WITH DEBRIDMENT, SUBACHROMIAL DECOMPRESSION;  Surgeon: Corky Mull, MD;  Location: [facility];  Service: Orthopedics;  Laterality: Right;  . SHOULDER ARTHROSCOPY WITH OPEN ROTATOR CUFF REPAIR Right 03/16/2017   Procedure: SHOULDER ARTHROSCOPY WITH OPEN ROTATOR CUFF REPAIR POSSIBLE BICEPS TENODESIS;  Surgeon: Corky Mull, MD;  Location: [facility];   Service: Orthopedics;  Laterality: Right;  . TRIGGER FINGER RELEASE Right     [place]     No current facility-administered medications for this encounter.  Current Outpatient Medications:  .  ALPRAZolam (XANAX) 1 MG tablet, Take 1 tablet (1 mg total) by mouth 2 (two) times daily as needed for anxiety. TAKE 1 TABLET BY MOUT, Disp: 60 tablet, Rfl: 1 .  amLODipine (NORVASC) 10 MG tablet, Take 5 mg by mouth daily. , Disp: , Rfl:  .  aspirin 81 MG EC tablet, Take 81 mg by mouth daily. , Disp: , Rfl:  .  buPROPion (WELLBUTRIN XL) 300 MG 24 hr tablet, Take 1 tablet (300 mg total) by mouth daily., Disp: 90 tablet, Rfl: 1 .  diphenoxylate-atropine (LOMOTIL) 2.5-0.025 MG tablet, TAKE 1 TABLET BY MOUTH 1 TO 4 TIMES DAILY FOR DIARRHEA, Disp: 30 tablet, Rfl: 0 .  EUTHYROX 25 MCG tablet, TAKE 1 TABLET BY MOUTH ONCE DAILY BEFORE BREAKFAST, Disp: 30 tablet, Rfl: 0 .  gabapentin (NEURONTIN) 300 MG capsule, Take 1 capsule (300 mg total) by mouth 2 (two)  times daily., Disp: 180 capsule, Rfl: 4 .  Galcanezumab-gnlm (EMGALITY) 120 MG/ML SOAJ, As needed, Disp: , Rfl:  .  isosorbide mononitrate (IMDUR) 60 MG 24 hr tablet, Take 1 tablet (60 mg total) by mouth daily., Disp: 90 tablet, Rfl: 1 .  losartan (COZAAR) 25 MG tablet, Take 25 mg by mouth daily., Disp: , Rfl:  .  metFORMIN (GLUCOPHAGE-XR) 500 MG 24 hr tablet, Take 500 mg by mouth every morning., Disp: , Rfl:  .  nitroGLYCERIN (NITROSTAT) 0.4 MG SL tablet, Place 1 tablet (0.4 mg total) under the tongue every 5 (five) minutes as needed for chest pain., Disp: 25 tablet, Rfl: 2 .  pantoprazole (PROTONIX) 40 MG tablet, Take 1 tablet by mouth twice daily, Disp: 180 tablet, Rfl: 1 .  traZODone (DESYREL) 100 MG tablet, Take 100 mg by mouth at bedtime as needed for sleep. , Disp: , Rfl:  .  venlafaxine XR (EFFEXOR-XR) 75 MG 24 hr capsule, TAKE 1 CAPSULE BY MOUTH ONCE DAILY WITH BREAKFAST, Disp: 90 capsule, Rfl: 0 .  amoxicillin (AMOXIL) 500 MG capsule, Take 500  mg by mouth 3 (three) times daily., Disp: , Rfl:  .  chlorhexidine (PERIDEX) 0.12 % solution, , Disp: , Rfl:  .  oxyCODONE-acetaminophen (PERCOCET) 5-325 MG tablet, Take 1 tablet by mouth every 8 (eight) hours as needed., Disp: 20 tablet, Rfl: 0  Allergies Lipitor [atorvastatin] and Tramadol  Family History  Problem Relation Age of Onset  . Hypertension Mother   . Anxiety disorder Mother   . Depression Mother   . Bipolar disorder Mother   . Heart disease Mother        No details  . Hyperlipidemia Mother   . Kidney disease Father   . Heart disease Father 34  . Hypertension Father   . Diabetes Father   . Stroke Father   . Colon cancer Father        dx in his 28's  . Anxiety disorder Father   . Depression Father   . Skin cancer Father   . Kidney disease Sister   . Thyroid nodules Sister   . Hypertension Sister   . Hypertension Sister   . Diabetes Sister   . Hyperlipidemia Sister   . Depression Sister   . Breast cancer Maternal Aunt 55  . Breast cancer Maternal Aunt 11  . Ovarian cancer Cousin   . Colon cancer Cousin   . Kidney cancer Cousin   . Breast cancer Other   . Bladder Cancer Neg Hx     Social History Social History   Tobacco Use  . Smoking status: Former Smoker    Types: Cigarettes    Quit date: 08/31/1994    Years since quitting: 24.9  . Smokeless tobacco: Never Used  . Tobacco comment: quit 28 years ago  Vaping Use  . Vaping Use: Never used  Substance Use Topics  . Alcohol use: No    Alcohol/week: 0.0 standard drinks  . Drug use: No    Review of Systems Constitutional: No fever Eyes: No visual changes. Cardiovascular: Denies chest pain. Respiratory: Denies shortness of breath. Musculoskeletal: Positive right hand and right wrist pain. Skin: Negative for rash. Neurological: Negative for headaches, focal weakness or numbness.    ____________________________________________   PHYSICAL EXAM:  VITAL SIGNS: ED Triage Vitals  Enc Vitals  Group     BP 08/07/19 0844 116/78     Pulse Rate 08/07/19 0844 60     Resp 08/07/19 0844 18  Temp 08/07/19 0844 98 F (36.7 C)     Temp Source 08/07/19 0844 Oral     SpO2 08/07/19 0844 100 %     Weight 08/07/19 0845 140 lb (63.5 kg)     Height 08/07/19 0845 '5\' 2"'  (1.575 m)     Head Circumference --      Peak Flow --      Pain Score 08/07/19 0844 10     Pain Loc --      Pain Edu? --      Excl. in [place]? --     Constitutional: Alert and oriented. Well appearing and in no acute distress. Eyes: Conjunctivae are normal.  ENT      Head: Normocephalic and atraumatic. Cardiovascular:  Good peripheral circulation. Respiratory: Normal respiratory effort without tachypnea nor retractions. Musculoskeletal: Steady gait.  Bilateral distal radial pulses equal. Except: Right distal wrist diffuse tenderness to palpation.  Right fourth and fifth metacarpal tenderness to direct palpation with mild localized ecchymosis and edema, right fifth proximal phalanx mild tenderness direct palpation, mild pain with distal resisted flexion of fourth and fifth right digits but good distal flexion and extension, right hand otherwise nontender. Neurologic:  Normal speech and language. Speech is normal. No gait instability.  Skin:  Skin is warm, dry and intact. No rash noted. Psychiatric: Mood and affect are normal. Speech and behavior are normal. Patient exhibits appropriate insight and judgment   ___________________________________________   LABS (all labs ordered are listed, but only abnormal results are displayed)  Labs Reviewed - No data to display ____________________________________________  RADIOLOGY  DG Wrist Complete Right  Result Date: 08/07/2019 CLINICAL DATA:  Status post fall yesterday with right hand swelling and pain. EXAM: RIGHT WRIST - COMPLETE 3+ VIEW COMPARISON:  None. FINDINGS: There is no evidence of fracture or dislocation. Soft tissues are unremarkable. IMPRESSION: No acute fracture or  dislocation noted. Electronically Signed   By: Abelardo Diesel M.D.   On: 08/07/2019 09:49   DG Hand Complete Right  Result Date: 08/07/2019 CLINICAL DATA:  Status post fall yesterday with right hand swelling and pain. EXAM: RIGHT HAND - COMPLETE 3+ VIEW COMPARISON:  None. FINDINGS: There is no evidence of fracture or dislocation. Soft tissues are unremarkable. IMPRESSION: No acute fracture or dislocation. Electronically Signed   By: Abelardo Diesel M.D.   On: 08/07/2019 09:48   ____________________________________________   PROCEDURES Procedures  Right hand Velcro cock-up splint applied. INITIAL IMPRESSION / ASSESSMENT AND PLAN / ED COURSE  Pertinent labs & imaging results that were available during my care of the patient were reviewed by me and considered in my medical decision making (see chart for details).  Well-appearing patient.  No acute distress.  Right hand and right wrist pain post mechanical fall that occurred yesterday.  Right hand and right wrist x-rays as above, reviewed, no acute fracture.  Suspect contusion sprain injuries.  Velcro cock-up splint given for support.  Over-the-counter medication as needed.  Follow-up with orthopedic as needed for continued pain.  Supportive care.  Discussed follow up with Primary care physician this week. Discussed follow up and return parameters including no resolution or any worsening concerns. Patient verbalized understanding and agreed to plan.   ____________________________________________   FINAL CLINICAL IMPRESSION(S) / ED DIAGNOSES  Final diagnoses:  Contusion of right hand, initial encounter  Right wrist pain  Fall, initial encounter     ED Discharge Orders    None       Note: This dictation was prepared  with Dragon dictation along with smaller phrase technology. Any transcriptional errors that result from this process are unintentional.          Marylene Land, NP 08/07/19 1354

## 2019-08-07 NOTE — ED Notes (Signed)
Velcro wrist brace applied right side

## 2019-08-07 NOTE — ED Triage Notes (Signed)
Patient in today after falling on her steps yesterday. Patient c/o right hand and wrist pain. Patient took Ibuprofen last night and has been icing her hand and wrist.

## 2019-08-10 ENCOUNTER — Other Ambulatory Visit: Payer: Self-pay | Admitting: Family Medicine

## 2019-08-14 ENCOUNTER — Other Ambulatory Visit: Payer: Self-pay | Admitting: Family Medicine

## 2019-08-14 DIAGNOSIS — F419 Anxiety disorder, unspecified: Secondary | ICD-10-CM

## 2019-08-14 NOTE — Telephone Encounter (Signed)
Requested medication (s) are due for refill today: yes  Requested medication (s) are on the active medication list: yes  Last refill:  06/23/2019  Future visit scheduled: yes   Notes to clinic:  this refill cannot be delegated    Requested Prescriptions  Pending Prescriptions Disp Refills   ALPRAZolam (XANAX) 1 MG tablet [Pharmacy Med Name: ALPRAZolam 1 MG Oral Tablet] 60 tablet 0    Sig: Take 1 tablet by mouth twice daily as needed      Not Delegated - Psychiatry:  Anxiolytics/Hypnotics Failed - 08/14/2019 10:03 AM      Failed - This refill cannot be delegated      Passed - Urine Drug Screen completed in last 360 days.      Passed - Valid encounter within last 6 months    Recent Outpatient Visits           3 months ago Chest pain, unspecified type   [facility] [name], MD   5 months ago [place], Donald E, MD   6 months ago Chest pain, unspecified type   [facility] [name], MD   6 months ago Recurrent UTI   [place], [place], PA-C   8 months ago Type 2 diabetes mellitus with stage 3a chronic kidney disease, without long-term current use of insulin [facility])   [facility] [name], MD       Future Appointments             In 1 week  [facility], [place]   In 1 week Fisher, Kirstie Peri, MD [facility], [place]   In 1 month Gollan, Kathlene November, MD [facility], [place]

## 2019-08-15 ENCOUNTER — Other Ambulatory Visit: Payer: Self-pay | Admitting: Family Medicine

## 2019-08-18 ENCOUNTER — Telehealth (INDEPENDENT_AMBULATORY_CARE_PROVIDER_SITE_OTHER): Payer: Medicare Other | Admitting: Physician Assistant

## 2019-08-18 DIAGNOSIS — R490 Dysphonia: Secondary | ICD-10-CM | POA: Diagnosis not present

## 2019-08-18 DIAGNOSIS — J029 Acute pharyngitis, unspecified: Secondary | ICD-10-CM

## 2019-08-18 DIAGNOSIS — E113411 Type 2 diabetes mellitus with severe nonproliferative diabetic retinopathy with macular edema, right eye: Secondary | ICD-10-CM | POA: Diagnosis not present

## 2019-08-18 LAB — HM DIABETES EYE EXAM

## 2019-08-18 NOTE — Progress Notes (Signed)
MyChart Video Visit    Virtual Visit via Video Note   This visit type was conducted due to national recommendations for restrictions regarding the COVID-19 Pandemic (e.g. social distancing) in an effort to limit this patient's exposure and mitigate transmission in our community. This patient is at least at moderate risk for complications without adequate follow up. This format is felt to be most appropriate for this patient at this time. Physical exam was limited by quality of the video and audio technology used for the visit.   Patient location: Home Provider location: Office    I discussed the limitations of evaluation and management by telemedicine and the availability of in person appointments. The patient expressed understanding and agreed to proceed.    Patient: [name]   DOB: March 06, 1970   49 y.o. Female  MRN: [id number] Visit Date: 08/18/2019  Today's healthcare provider: Trinna Post, PA-C   Chief Complaint  Patient presents with  . Sore Throat   Subjective    HPI   Patient presents today with a sore throat and hoarse voice. This has been present for one month. She reports trying allergy medicines for this which did not help her. She reports she has trouble swallowing at this point, mainly with pills. She requires a lot of liquid. She does take euthyroid for hypothyroidism.     Medications: Outpatient Medications Prior to Visit  Medication Sig  . ALPRAZolam (XANAX) 1 MG tablet Take 1 tablet by mouth twice daily as needed  . amLODipine (NORVASC) 10 MG tablet Take 5 mg by mouth daily.   Marland Kitchen amoxicillin (AMOXIL) 500 MG capsule Take 500 mg by mouth 3 (three) times daily.  Marland Kitchen aspirin 81 MG EC tablet Take 81 mg by mouth daily.   Marland Kitchen buPROPion (WELLBUTRIN XL) 300 MG 24 hr tablet Take 1 tablet (300 mg total) by mouth daily.  . chlorhexidine (PERIDEX) 0.12 % solution   . diphenoxylate-atropine (LOMOTIL) 2.5-0.025 MG tablet TAKE 1 TABLET BY MOUTH 1 TO 4 TIMES  DAILY FOR DIARRHEA.  Arna Medici 25 MCG tablet TAKE 1 TABLET BY MOUTH ONCE DAILY BEFORE BREAKFAST  . gabapentin (NEURONTIN) 300 MG capsule Take 1 capsule (300 mg total) by mouth 2 (two) times daily.  Marland Kitchen Galcanezumab-gnlm (EMGALITY) 120 MG/ML SOAJ As needed  . isosorbide mononitrate (IMDUR) 60 MG 24 hr tablet Take 1 tablet (60 mg total) by mouth daily.  Marland Kitchen losartan (COZAAR) 25 MG tablet Take 25 mg by mouth daily.  . metFORMIN (GLUCOPHAGE-XR) 500 MG 24 hr tablet Take 500 mg by mouth every morning.  . nitroGLYCERIN (NITROSTAT) 0.4 MG SL tablet Place 1 tablet (0.4 mg total) under the tongue every 5 (five) minutes as needed for chest pain.  Marland Kitchen oxyCODONE-acetaminophen (PERCOCET) 5-325 MG tablet Take 1 tablet by mouth every 8 (eight) hours as needed.  . pantoprazole (PROTONIX) 40 MG tablet Take 1 tablet by mouth twice daily  . traZODone (DESYREL) 100 MG tablet Take 100 mg by mouth at bedtime as needed for sleep.   Marland Kitchen venlafaxine XR (EFFEXOR-XR) 75 MG 24 hr capsule TAKE 1 CAPSULE BY MOUTH ONCE DAILY WITH BREAKFAST   No facility-administered medications prior to visit.    Review of Systems    Objective    There were no vitals taken for this visit.   Physical Exam Constitutional:      Appearance: Normal appearance.  Neurological:     Mental Status: She is alert.        Assessment &  Plan    1. Sore throat  - Ambulatory referral to ENT  2. Hoarse voice quality  - Ambulatory referral to ENT     No follow-ups on file.     I discussed the assessment and treatment plan with the patient. The patient was provided an opportunity to ask questions and all were answered. The patient agreed with the plan and demonstrated an understanding of the instructions.   The patient was advised to call back or seek an in-person evaluation if the symptoms worsen or if the condition fails to improve as anticipated.  I have spent 15 minutes with this patient, >50% of which was spent on counseling and  coordination of care.   ITrinna Post, PA-C, have reviewed all documentation for this visit. The documentation on 08/18/19 for the exam, diagnosis, procedures, and orders are all accurate and complete.   Paulene Floor [facility] [phone] (phone) [phone] (fax)  [name]

## 2019-08-21 NOTE — Progress Notes (Signed)
I,Roshena L Chambers,acting as a scribe for [name], MD.,have documented all relevant documentation on the behalf of [name], MD,as directed by  [name], MD while in the presence of [name], MD.  Established patient visit   Patient: [name]   DOB: [birth year]   49 y.o. Female  MRN: [id number] Visit Date: 08/22/2019  Today's healthcare provider: [name], MD   Chief Complaint  Patient presents with  . Panic Attack  . Vaginal Discharge   Subjective    HPI  Anxiety, Follow-up  She was last seen for anxiety 3 months ago. Changes made at last visit include none; symptoms were stable on current medications.   She reports good compliance with treatment. She reports good tolerance of treatment. She is not having side effects.   She feels her anxiety is moderate and Worse since last visit.  Symptoms: No chest pain Yes difficulty concentrating  No dizziness Yes fatigue  Yes feelings of losing control Yes insomnia  Yes irritable No palpitations  No panic attacks Yes racing thoughts  Yes shortness of breath Yes sweating  Yes tremors/shakes    GAD-7 Results GAD-7 Generalized Anxiety Disorder Screening Tool 08/22/2019  1. Feeling Nervous, Anxious, or on Edge 3  2. Not Being Able to Stop or Control Worrying 3  3. Worrying Too Much About Different Things 3  4. Trouble Relaxing 3  5. Being So Restless it's Hard To Sit Still 3  6. Becoming Easily Annoyed or Irritable 3  7. Feeling Afraid As If Something Awful Might Happen 3  Total GAD-7 Score 21  Difficulty At Work, Home, or Getting  Along With Others? Very difficult    PHQ-9 Scores PHQ9 SCORE ONLY 08/22/2019 08/22/2019 12/09/2018  PHQ-9 Total Score 23 20 25     ---------------------------------------------------------------------------------------------------  Vaginal discharge:  Patient complains of white itchy vaginal discharge for the past 2 days. She also complains of odorous  urine.      Medications: Outpatient Medications Prior to Visit  Medication Sig  . ALPRAZolam (XANAX) 1 MG tablet Take 1 tablet by mouth twice daily as needed  . amLODipine (NORVASC) 10 MG tablet Take 10 mg by mouth daily.   Marland Kitchen aspirin 81 MG EC tablet Take 81 mg by mouth daily.   Marland Kitchen buPROPion (WELLBUTRIN XL) 300 MG 24 hr tablet Take 1 tablet (300 mg total) by mouth daily.  . celecoxib (CELEBREX) 200 MG capsule Take 200 mg by mouth 2 (two) times daily.  . Cholecalciferol 50 MCG (2000 UT) TABS Take 2,000 Units by mouth daily.  . diphenoxylate-atropine (LOMOTIL) 2.5-0.025 MG tablet TAKE 1 TABLET BY MOUTH 1 TO 4 TIMES DAILY FOR DIARRHEA.  Arna Medici 25 MCG tablet TAKE 1 TABLET BY MOUTH ONCE DAILY BEFORE BREAKFAST  . gabapentin (NEURONTIN) 300 MG capsule Take 1 capsule (300 mg total) by mouth 2 (two) times daily.  . isosorbide mononitrate (IMDUR) 60 MG 24 hr tablet Take 1 tablet (60 mg total) by mouth daily.  Marland Kitchen losartan (COZAAR) 25 MG tablet Take 25 mg by mouth daily.  . Multiple Vitamin (MULTI-VITAMIN) tablet Take 1 tablet by mouth daily. occasionally  . nitroGLYCERIN (NITROSTAT) 0.4 MG SL tablet Place 1 tablet (0.4 mg total) under the tongue every 5 (five) minutes as needed for chest pain.  . pantoprazole (PROTONIX) 40 MG tablet Take 1 tablet by mouth twice daily  . promethazine (PHENERGAN) 25 MG tablet Take 25 mg by mouth every 6 (six) hours as needed.  . propranolol  ER (INDERAL LA) 60 MG 24 hr capsule Take 60 mg by mouth at bedtime.  . traZODone (DESYREL) 100 MG tablet Take 100 mg by mouth at bedtime as needed for sleep.   Marland Kitchen venlafaxine XR (EFFEXOR-XR) 75 MG 24 hr capsule TAKE 1 CAPSULE BY MOUTH ONCE DAILY WITH BREAKFAST  . metFORMIN (GLUCOPHAGE-XR) 500 MG 24 hr tablet Take 500 mg by mouth every morning.  (Patient not taking: Reported on 08/22/2019)  . oxyCODONE-acetaminophen (PERCOCET) 5-325 MG tablet Take 1 tablet by mouth every 8 (eight) hours as needed. (Patient not taking: Reported on  08/22/2019)  . [DISCONTINUED] amoxicillin (AMOXIL) 500 MG capsule Take 500 mg by mouth 3 (three) times daily.  (Patient not taking: Reported on 08/22/2019)  . [DISCONTINUED] chlorhexidine (PERIDEX) 0.12 % solution  (Patient not taking: Reported on 08/22/2019)  . [DISCONTINUED] Galcanezumab-gnlm (EMGALITY) 120 MG/ML SOAJ As needed (Patient not taking: Reported on 08/22/2019)   No facility-administered medications prior to visit.    Review of Systems  Constitutional: Negative for appetite change, chills, fatigue and fever.  Respiratory: Negative for chest tightness and shortness of breath.   Cardiovascular: Negative for chest pain and palpitations.  Gastrointestinal: Negative for abdominal pain, nausea and vomiting.  Genitourinary: Positive for vaginal discharge.  Neurological: Negative for dizziness and weakness.      Objective    BP (!) 165/85 (BP Location: Right Arm, Cuff Size: Large)   Pulse (!) 51   Temp (!) 97.1 F (36.2 C) (Temporal)   Resp 16   Wt 147 lb 6.4 oz (66.9 kg)   BMI 26.96 kg/m    Physical Exam  General appearance:  Overweight female, cooperative and in no acute distress Head: Normocephalic, without obvious abnormality, atraumatic Respiratory: Respirations even and unlabored, normal respiratory rate Extremities: All extremities are intact.  Skin: Skin color, texture, turgor normal. No rashes seen  Psych: Appropriate mood and affect. Neurologic: Mental status: Alert, oriented to person, place, and time, thought content appropriate.   Results for orders placed or performed in visit on 08/22/19  POCT Urinalysis Dipstick  Result Value Ref Range   Color, UA yellow    Clarity, UA cloudy    Glucose, UA Negative Negative   Bilirubin, UA small    Ketones, UA negative    Spec Grav, UA 1.015 1.010 - 1.025   Blood, UA trace (non-hemolyzed)    pH, UA 6.0 5.0 - 8.0   Protein, UA Positive (A) Negative   Urobilinogen, UA 0.2 0.2 or 1.0 E.U./dL   Nitrite, UA positive      Leukocytes, UA Large (3+) (A) Negative   Appearance     Odor      Assessment & Plan     1. Anxiety Was previously seeing counselor at [facility] who has apparently moved away.  - Ambulatory referral to Psychiatry  2. Major depressive disorder, recurrent, severe(HCC) Discussed options of increasing venlafaxine versus change to SSRI. She just had venlafaxine filled so will try doubling dose of this first. She is to call if she has any adverse effects.  - venlafaxine XR (EFFEXOR-XR) 75 MG 24 hr capsule; Take 2 capsules (150 mg total) by mouth daily with breakfast.  Dispense: 3 capsule  3. Acute cystitis without hematuria  - Urine Culture - cephALEXin (KEFLEX) 500 MG capsule; Take 1 capsule (500 mg total) by mouth 3 (three) times daily for 7 days.  Dispense: 21 capsule; Refill: 0   4. Hypertension associated with stage 3 chronic kidney disease due to type 2 diabetes  mellitus ([place]) She states she did not take her BP medications today and that home BP is usually in the normal range. She was advised to check regularly and call if over 150/100.   Follow up in 1 months       The entirety of the information documented in the History of Present Illness, Review of Systems and Physical Exam were personally obtained by me. Portions of this information were initially documented by the CMA and reviewed by me for thoroughness and accuracy.      [name], MD  [facility] [phone] (phone) [phone] (fax)  [place]

## 2019-08-21 NOTE — Progress Notes (Signed)
Subjective:   [name] is a 49 y.o. female who presents for an Initial Medicare Annual Wellness Visit.  I connected with [address] today by telephone and verified that I am speaking with the correct person using two identifiers. Location patient: home Location provider: work Persons participating in the virtual visit: patient, provider.   I discussed the limitations, risks, security and privacy concerns of performing an evaluation and management service by telephone and the availability of in person appointments. I also discussed with the patient that there may be a patient responsible charge related to this service. The patient expressed understanding and verbally consented to this telephonic visit.    Interactive audio and video telecommunications were attempted between this provider and patient, however failed, due to patient having technical difficulties OR patient did not have access to video capability.  We continued and completed visit with audio only.   Review of Systems    N/A  Cardiac Risk Factors include: advanced age (>19mn, >>26women);diabetes mellitus;hypertension     Objective:    Today's Vitals   08/22/19 0837  PainSc: 9    There is no height or weight on file to calculate BMI.  Advanced Directives 08/22/2019 08/04/2019 06/09/2019 05/15/2019 04/18/2019 04/18/2019 04/18/2019  Does Patient Have a Medical Advance Directive? Yes _0  No  Type of AParamedicof ABeloitLiving will - - - - - -  Copy of HAlexandriain Chart? Yes - validated most recent copy scanned in chart (See row information) - - - - - -  Would patient like information on creating a medical advance directive? - No - Patient declined - - No - Patient declined No - Patient declined No - Patient declined  Some encounter information is confidential and restricted. Go to Review Flowsheets activity to see all data.    Current Medications  (verified) Outpatient Encounter Medications as of 08/22/2019  Medication Sig  . ALPRAZolam (XANAX) 1 MG tablet Take 1 tablet by mouth twice daily as needed  . amLODipine (NORVASC) 10 MG tablet Take 10 mg by mouth daily.   .Marland Kitchenaspirin 81 MG EC tablet Take 81 mg by mouth daily.   .Marland KitchenbuPROPion (WELLBUTRIN XL) 300 MG 24 hr tablet Take 1 tablet (300 mg total) by mouth daily.  . celecoxib (CELEBREX) 200 MG capsule Take 200 mg by mouth 2 (two) times daily.  . Cholecalciferol 50 MCG (2000 UT) TABS Take 2,000 Units by mouth daily.  . diphenoxylate-atropine (LOMOTIL) 2.5-0.025 MG tablet TAKE 1 TABLET BY MOUTH 1 TO 4 TIMES DAILY FOR DIARRHEA.  .Arna Medici25 MCG tablet TAKE 1 TABLET BY MOUTH ONCE DAILY BEFORE BREAKFAST  . gabapentin (NEURONTIN) 300 MG capsule Take 1 capsule (300 mg total) by mouth 2 (two) times daily.  . isosorbide mononitrate (IMDUR) 60 MG 24 hr tablet Take 1 tablet (60 mg total) by mouth daily.  .Marland Kitchenlosartan (COZAAR) 25 MG tablet Take 25 mg by mouth daily.  . Multiple Vitamin (MULTI-VITAMIN) tablet Take 1 tablet by mouth daily. occasionally  . nitroGLYCERIN (NITROSTAT) 0.4 MG SL tablet Place 1 tablet (0.4 mg total) under the tongue every 5 (five) minutes as needed for chest pain.  . pantoprazole (PROTONIX) 40 MG tablet Take 1 tablet by mouth twice daily  . promethazine (PHENERGAN) 25 MG tablet Take 25 mg by mouth every 6 (six) hours as needed.  . propranolol ER (INDERAL LA) 60 MG 24 hr capsule Take 60  mg by mouth at bedtime.  . traZODone (DESYREL) 100 MG tablet Take 100 mg by mouth at bedtime as needed for sleep.   Marland Kitchen venlafaxine XR (EFFEXOR-XR) 75 MG 24 hr capsule TAKE 1 CAPSULE BY MOUTH ONCE DAILY WITH BREAKFAST  . amoxicillin (AMOXIL) 500 MG capsule Take 500 mg by mouth 3 (three) times daily. (Patient not taking: Reported on 08/22/2019)  . chlorhexidine (PERIDEX) 0.12 % solution  (Patient not taking: Reported on 08/22/2019)  . Galcanezumab-gnlm (EMGALITY) 120 MG/ML SOAJ As needed (Patient not  taking: Reported on 08/22/2019)  . metFORMIN (GLUCOPHAGE-XR) 500 MG 24 hr tablet Take 500 mg by mouth every morning. (Patient not taking: Reported on 08/22/2019)  . oxyCODONE-acetaminophen (PERCOCET) 5-325 MG tablet Take 1 tablet by mouth every 8 (eight) hours as needed. (Patient not taking: Reported on 08/22/2019)   No facility-administered encounter medications on file as of 08/22/2019.    Allergies (verified) Lipitor [atorvastatin] and Tramadol   History: Past Medical History:  Diagnosis Date  . Acute colitis 01/27/2017  . Acute pyelonephritis   . Anemia    iron deficiency anemia  . Aortic arch aneurysm ([place])   . BRCA negative 2014  . CAD (coronary artery disease)    a. 08/2003 Cath: LAD 30-40-med Rx; b. 11/2014 PCI: LAD 23m(3.25x23 Xience Alpine DES); c. 06/2015 PCI: D1 (2.25x12 Resolute Integrity DES); d. 06/2017 PCI: Patent mLAD stent, D2 95 (PTCA); e. 09/2017 PCI: D2 99ost (CBA); d. 12/2017 Cath: LM nl, LAD 312m80d (small), D1 40ost, D2 95ost, LCX 40p, RCA 40ost/p->Med rx for D2 given restenosis.  . Colitis 06/03/2015  . Colon polyp   . CVA (cerebral vascular accident) ([facility]   Left side weakness.   . Degenerative tear of glenoid labrum of right shoulder 03/15/2017  . Diabetes mellitus without complication ([facility]  . Family history of breast cancer    BRCA neg 2014  . Femur fracture, left ([place]9/10/2018  . Gastric ulcer 04/27/2011  . History of echocardiogram    a. 03/2017 Echo: EF 60-65%, no rwma; b. 02/2018 Echo: EF 60-65%, no rwma. Nl RV fxn. No cardiac source of emboli (admitted w/ stroke).  . Hypertension   . Malignant melanoma of skin of scalp ([facility]  . MI, acute, non ST segment elevation ([facility]  . Neuromuscular disorder ([facility]  . S/P drug eluting coronary stent placement 06/04/2015  . Sepsis ([facility]2/14/2019  . [facility] ([facility]   a. 02/2018 MRI: 78m41mate acute/early subacute L medial frontal lobe inarct; b. 02/2018 MRA No large vessel occlusion or aneurysm. Mod to sev L P2  stenosis. thready L vertebral artery, diffusely dzs'd; c. 02/2018 Carotid U/S: <50% bilat ICA dzs.   Past Surgical History:  Procedure Laterality Date  . APPENDECTOMY    . CARDIAC CATHETERIZATION N/A 11/09/2014   Procedure: Coronary Angiography;  Surgeon: TimMinna MerrittsD;  [facility];  Service: Cardiovascular;  Laterality: N/A;  . CARDIAC CATHETERIZATION N/A 11/12/2014   Procedure: Coronary Stent Intervention;  Surgeon: AleIsaias CowmanD;  [facility];  Service: Cardiovascular;  Laterality: N/A;  . CARDIAC CATHETERIZATION N/A 04/18/2015   Procedure: Left Heart Cath and Coronary Angiography;  Surgeon: TimMinna MerrittsD;  [facility];  Service: Cardiovascular;  Laterality: N/A;  . CARDIAC CATHETERIZATION Left 06/04/2015   Procedure: Left Heart Cath and Coronary Angiography;  Surgeon: MuhWellington HampshireD;  [facility];  Service: Cardiovascular;  Laterality:  Left;  . CARDIAC CATHETERIZATION N/A 06/04/2015   Procedure: Coronary Stent Intervention;  Surgeon: Wellington Hampshire, MD;  Location: [place] [facility];  Service: Cardiovascular;  Laterality: N/A;  . CESAREAN SECTION  2001  . CHOLECYSTECTOMY N/A 11/18/2016   Procedure: LAPAROSCOPIC CHOLECYSTECTOMY WITH INTRAOPERATIVE CHOLANGIOGRAM;  Surgeon: Christene Lye, MD;  [facility];  Service: General;  Laterality: N/A;  . COLONOSCOPY WITH PROPOFOL N/A 04/27/2016   Procedure: COLONOSCOPY WITH PROPOFOL;  Surgeon: [name], MD;  Location: [place];  Service: Endoscopy;  Laterality: N/A;  . COLONOSCOPY WITH PROPOFOL N/A 01/12/2018   Procedure: COLONOSCOPY WITH PROPOFOL;  Surgeon: Toledo, Benay Pike, MD;  [facility];  Service: Endoscopy;  Laterality: N/A;  . CORONARY ANGIOPLASTY    . CORONARY BALLOON ANGIOPLASTY N/A 06/29/2017   Procedure: CORONARY BALLOON ANGIOPLASTY;  Surgeon: Wellington Hampshire, MD;  Location: [place] [facility]  LAB;  Service: Cardiovascular;  Laterality: N/A;  . CORONARY BALLOON ANGIOPLASTY N/A 09/20/2017   Procedure: CORONARY BALLOON ANGIOPLASTY;  Surgeon: Wellington Hampshire, MD;  Location: [place] [facility];  Service: Cardiovascular;  Laterality: N/A;  . DILATION AND CURETTAGE OF UTERUS    . ESOPHAGOGASTRODUODENOSCOPY (EGD) WITH PROPOFOL N/A 09/14/2014   Procedure: ESOPHAGOGASTRODUODENOSCOPY (EGD) WITH PROPOFOL;  Surgeon: Josefine Class, MD;  Location[facility] ENDOS[facility];  Service: Endoscopy;  Laterality: N/A;  . ESOPHAGOGASTRODUODENOSCOPY (EGD) WITH PROPOFOL N/A 04/27/2016   Procedure: ESOPHAGOGASTRODUODENOSCOPY (EGD) WITH PROPOFOL;  Surgeon: [name], MD;  Location: [place];  Service: Endoscopy;  Laterality: N/A;  Diabetic - oral meds  . ESOPHAGOGASTRODUODENOSCOPY (EGD) WITH PROPOFOL N/A 01/12/2018   Procedure: ESOPHAGOGASTRODUODENOSCOPY (EGD) WITH PROPOFOL;  Surgeon: Toledo, Benay Pike, MD;  [facility];  Service: Endoscopy;  Laterality: N/A;  . ESOPHAGOGASTRODUODENOSCOPY (EGD) WITH PROPOFOL N/A 04/11/2019   Procedure: ESOPHAGOGASTRODUODENOSCOPY (EGD) WITH PROPOFOL;  Surgeon: [name], MD;  [facility];  Service: Endoscopy;  Laterality: N/A;  . GASTRIC BYPASS  09/2009   [place] (IM) NAIL INTERTROCHANTERIC Left 10/13/2018   Procedure: INTRAMEDULLARY (IM) NAIL INTERTROCHANTRIC;  Surgeon: Leandrew Koyanagi, MD;  Location: [place];  Service: Orthopedics;  Laterality: Left;  . Left Carotid to sublcavian artery bypass w/ subclavian artery ligation     a. Performed @ [place].  . LEFT HEART CATH AND CORONARY ANGIOGRAPHY Left 06/29/2017   Procedure: LEFT HEART CATH AND CORONARY ANGIOGRAPHY;  Surgeon: Wellington Hampshire, MD;  Location: [place] [facility];  Service: Cardiovascular;  Laterality: Left;  . LEFT HEART CATH AND CORONARY ANGIOGRAPHY N/A 09/20/2017   Procedure: LEFT HEART CATH AND CORONARY ANGIOGRAPHY;  Surgeon: Wellington Hampshire, MD;  Location: [place] [facility];  Service: Cardiovascular;  Laterality: N/A;  . LEFT HEART CATH AND CORONARY ANGIOGRAPHY N/A 12/20/2017   Procedure: LEFT HEART CATH AND CORONARY ANGIOGRAPHY;  Surgeon: Wellington Hampshire, MD;  Location: [employer] [facility];  Service: Cardiovascular;  Laterality: N/A;  . LEFT HEART CATH AND CORONARY ANGIOGRAPHY N/A 04/20/2019   Procedure: LEFT HEART CATH AND CORONARY ANGIOGRAPHY possible PCI;  Surgeon: Minna Merritts, MD;  Location: [place] [facility];  Service: Cardiovascular;  Laterality: N/A;  . MELANOMA EXCISION  2016   Dr. Evorn Gong  . [place]  2002  . RIGHT OOPHORECTOMY    . SHOULDER ARTHROSCOPY WITH OPEN ROTATOR CUFF REPAIR Right 01/07/2016   Procedure: SHOULDER ARTHROSCOPY WITH DEBRIDMENT, SUBACHROMIAL DECOMPRESSION;  Surgeon: Corky Mull, MD;  [facility];  Service: Orthopedics;  Laterality: Right;  . SHOULDER ARTHROSCOPY WITH OPEN ROTATOR CUFF REPAIR Right 03/16/2017   Procedure: SHOULDER ARTHROSCOPY WITH OPEN ROTATOR CUFF REPAIR POSSIBLE BICEPS TENODESIS;  Surgeon: Corky Mull, MD;  [facility];  Service: Orthopedics;  Laterality: Right;  . TRIGGER FINGER RELEASE Right     [place]   Family History  Problem Relation Age of Onset  . Hypertension Mother   . Anxiety disorder Mother   . Depression Mother   . Bipolar disorder Mother   . Heart disease Mother        No details  . Hyperlipidemia Mother   . Kidney disease Father   . Heart disease Father 35  . Hypertension Father   . Diabetes Father   . Stroke Father   . Colon cancer Father        dx in his 22's  . Anxiety disorder Father   . Depression Father   . Skin cancer Father   . Kidney disease Sister   . Thyroid nodules Sister   . Hypertension Sister   . Hypertension Sister   . Diabetes Sister   . Hyperlipidemia Sister   . Depression Sister   . Breast cancer Maternal Aunt 5  . Breast cancer Maternal Aunt 39  . Ovarian cancer Cousin     . Colon cancer Cousin   . Kidney cancer Cousin   . Breast cancer Other   . Bladder Cancer Neg Hx    Social History   Socioeconomic History  . Marital status: Divorced    Spouse name: Not on file  . Number of children: 1  . Years of education: Not on file  . Highest education level: High school graduate  Occupational History  . 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 Disabled through 08/02/2013 per forms 02/03/2013  Tobacco Use  . Smoking status: Former Smoker    Types: Cigarettes    Quit date: 08/31/1994    Years since quitting: 24.9  . Smokeless tobacco: Never Used  . Tobacco comment: quit 28 years ago  Vaping Use  . Vaping Use: Never used  Substance and Sexual Activity  . Alcohol use: No    Alcohol/week: 0.0 standard drinks  . Drug use: No  . Sexual activity: Not Currently    Birth control/protection: None, Surgical    Comment: Ablation  Other Topics Concern  . 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   Lives at home with son   Social Determinants of Health   Financial Resource Strain: Low Risk   . Difficulty of Paying Living Expenses: Not very hard  Food Insecurity: No Food Insecurity  . Worried About Charity fundraiser in the Last Year: Never true  . Ran Out of Food in the Last Year: Never true  Transportation Needs: No Transportation Needs  . Lack of Transportation (Medical): No  . Lack of Transportation (Non-Medical): No  Physical Activity: Inactive  . Days of Exercise per Week: 0 days  . Minutes of Exercise per Session: 0 min  Stress: Stress Concern Present  . Feeling of Stress : Very much  Social Connections: Moderately Isolated  . Frequency of Communication with Friends and Family: More than three times a week  . Frequency of Social Gatherings with Friends and Family:  More than three times a  week  . Attends Religious Services: More than 4 times per year  . Active Member of Clubs or Organizations: No  . Attends Archivist Meetings: Never  . Marital Status: Divorced    Tobacco Counseling Counseling given: Not Answered Comment: quit 28 years ago   Clinical Intake:  Pre-visit preparation completed: Yes  Pain : 0-10 Pain Score: 9  Pain Type: Chronic pain Pain Location: Hip (and right hand from recent fall) Pain Orientation: Left Pain Descriptors / Indicators: Aching Pain Frequency: Intermittent Pain Relieving Factors: Takes Ibuprofen for pain.  Pain Relieving Factors: Takes Ibuprofen for pain.  Nutritional Risks: Nausea/ vomitting/ diarrhea (Nausea occasionally due to diet.) Diabetes: Yes  How often do you need to have someone help you when you read instructions, pamphlets, or other written materials from your doctor or pharmacy?: 1 - Never  Diabetic? Yes  Nutrition Risk Assessment:  Has the patient had any N/V/D within the last 2 months?  No  Does the patient have any non-healing wounds?  No  Has the patient had any unintentional weight loss or weight gain?  No   Diabetes:  Is the patient diabetic?  Yes  If diabetic, was a CBG obtained today?  No  Did the patient bring in their glucometer from home?  No  How often do you monitor your CBG's? Two to three times a day.   Financial Strains and Diabetes Management:  Are you having any financial strains with the device, your supplies or your medication? No .  Does the patient want to be seen by Chronic Care Management for management of their diabetes?  No  Would the patient like to be referred to a Nutritionist or for Diabetic Management?  No   Diabetic Exams:  Diabetic Eye Exam: Per patient exam was completed this [facility]. Requested records to be sent to clinic.   Diabetic Foot Exam: Completed 07/26/19. Repeat yearly.    Interpreter Needed?: No  Information  entered by :: [facility], LPN   Activities of Daily Living In your present state of health, do you have any difficulty performing the following activities: 08/22/2019 04/18/2019  Hearing? Y N  Comment Does not wear hearing aids. Pt plans to f/u with ENT. -  Vision? Y N  Comment Due to diabetes. -  Difficulty concentrating or making decisions? Y N  Comment Pt plans to f/u with neurology regarding memory loss. -  Walking or climbing stairs? Y Y  Comment Due to hip pain. -  Dressing or bathing? N N  Doing errands, shopping? N N  Preparing Food and eating ? N -  Using the Toilet? N -  In the past six months, have you accidently leaked urine? Y -  Comment Wears depends at night due to leakage from urgency. -  Do you have problems with loss of bowel control? N -  Managing your Medications? N -  Managing your Finances? N -  Housekeeping or managing your Housekeeping? N -  Some recent data might be hidden    Patient Care Team: [name], MD as PCP - General (Family Medicine) [name] Kathlene November, MD as PCP - Cardiology (Cardiology) Vladimir Crofts, MD as Consulting Physician (Neurology) Anthonette Legato, MD as Consulting Physician (Nephrology) Lloyd Huger, MD as Consulting Physician (Oncology) Poggi, Marshall Cork, MD as Consulting Physician (Orthopedic Surgery) Warnell Forester, NP as Nurse Practitioner (Surgery) Caroline More, DPM as Consulting Physician (Podiatry) Copland, Ginette Otto as Referring Physician (Obstetrics and Gynecology) Manuella Ghazi,  Lerry Paterson, MD as Consulting Physician (Ophthalmology)  Indicate any recent Medical Services you may have received from other than [facility] providers in the past year (date may be approximate).     Assessment:   This is a routine wellness examination for [name].  Hearing/Vision screen No exam data present  Dietary issues and exercise activities discussed: Current Exercise Habits: The patient does not participate in regular exercise  at present, Exercise limited by: orthopedic condition(s);neurologic condition(s)  Goals    . DIET - INCREASE WATER INTAKE      Depression Screen PHQ 2/9 Scores 08/22/2019 12/09/2018 10/20/2017 05/18/2016  PHQ - 2 Score _0 PHQ- 9 Score _1 Fall Risk Fall Risk  08/22/2019 02/08/2019  Falls in the past year? 1 0  Number falls in past yr: 1 -  Injury with Fall? 0 -  Risk for fall due to : Other (Comment) -  Risk for fall due to: Comment Due to neuropathy in both feet. -  Follow up Falls prevention discussed -    Any stairs in or around the home? Yes  If so, are there any without handrails? No  Home free of loose throw rugs in walkways, pet beds, electrical cords, etc? Yes  Adequate lighting in your home to reduce risk of falls? Yes   ASSISTIVE DEVICES UTILIZED TO PREVENT FALLS:  Life alert? No  Use of a cane, walker or w/c? Yes  Grab bars in the bathroom? No  Shower chair or bench in shower? Yes  Elevated toilet seat or a handicapped toilet? No   Cognitive Function: Declined today.         Immunizations Immunization History  Administered Date(s) Administered  . Influenza Whole 12/05/2008  . Influenza,inj,Quad PF,6+ Mos 11/11/2014, 11/02/2015, 10/14/2016, 10/20/2017, 11/04/2018  . Pneumococcal Polysaccharide-23 03/08/2012, 03/24/2014  . Tdap 09/04/2011    TDAP status: Up to date Flu Vaccine status: Up to date Covid-19 vaccine status: Completed vaccines  Qualifies for Shingles Vaccine? No     Screening Tests Health Maintenance  Topic Date Due  . COVID-19 Vaccine (1) Never done  . OPHTHALMOLOGY EXAM  04/06/2014  . INFLUENZA VACCINE  09/03/2019  . HEMOGLOBIN A1C  10/20/2019  . FOOT EXAM  07/25/2020  . MAMMOGRAM  06/01/2021  . TETANUS/TDAP  09/03/2021  . PAP SMEAR-Modifier  05/23/2022  . COLONOSCOPY  01/13/2023  . PNEUMOCOCCAL POLYSACCHARIDE VACCINE AGE 75-64 HIGH RISK  Completed  . Hepatitis C Screening  Completed  . HIV Screening  Completed     Health Maintenance  Health Maintenance Due  Topic Date Due  . COVID-19 Vaccine (1) Never done  . OPHTHALMOLOGY EXAM  04/06/2014    Colorectal cancer screening: Completed 01/12/18. Repeat every 5 years Mammogram status: Completed 06/02/19. Repeat every year  Lung Cancer Screening: (Low Dose CT Chest recommended if Age 54-80 years, 30 pack-year currently smoking OR have quit w/in 15years.) does not qualify.   Additional Screening:  Hepatitis C Screening: Up to date  Vision Screening: Recommended annual ophthalmology exams for early detection of glaucoma and other disorders of the eye. Is the patient up to date with their annual eye exam?  No  Who is the provider or what is the name of the office in which the patient attends annual eye exams? Dr Manuella Ghazi in [place] If pt is not established with a provider, would they like to be referred to a provider to establish care? No .   Dental Screening: Recommended  annual dental exams for proper oral hygiene  Community Resource Referral / Chronic Care Management: CRR required this visit?  No   CCM required this visit?  No      Plan:     I have personally reviewed and noted the following in the patient's chart:   . Medical and social history . Use of alcohol, tobacco or illicit drugs  . Current medications and supplements . Functional ability and status . Nutritional status . Physical activity . Advanced directives . List of other physicians . Hospitalizations, surgeries, and ER visits in previous 12 months . Vitals . Screenings to include cognitive, depression, and falls . Referrals and appointments  In addition, I have reviewed and discussed with patient certain preventive protocols, quality metrics, and best practice recommendations. A written personalized care plan for preventive services as well as general preventive health recommendations were provided to patient.     Talayia Hjort [place], [place]   04/16/9456   Nurse  Notes: Requested previous eye exam records to be sent to clinic. Pt to bring in COVID vaccine card to update in chart. Pt failed depression screening today- please f/u on this at in office apt.

## 2019-08-22 ENCOUNTER — Encounter: Payer: Self-pay | Admitting: Family Medicine

## 2019-08-22 ENCOUNTER — Telehealth: Payer: Self-pay | Admitting: Cardiovascular Disease

## 2019-08-22 ENCOUNTER — Other Ambulatory Visit: Payer: Self-pay

## 2019-08-22 ENCOUNTER — Ambulatory Visit: Payer: Medicare Other

## 2019-08-22 ENCOUNTER — Telehealth: Payer: Self-pay | Admitting: Family Medicine

## 2019-08-22 ENCOUNTER — Ambulatory Visit (INDEPENDENT_AMBULATORY_CARE_PROVIDER_SITE_OTHER): Payer: Medicare Other | Admitting: Family Medicine

## 2019-08-22 VITALS — BP 165/85 | HR 51 | Temp 97.1°F | Resp 16 | Wt 147.4 lb

## 2019-08-22 DIAGNOSIS — E1122 Type 2 diabetes mellitus with diabetic chronic kidney disease: Secondary | ICD-10-CM | POA: Diagnosis not present

## 2019-08-22 DIAGNOSIS — F321 Major depressive disorder, single episode, moderate: Secondary | ICD-10-CM | POA: Diagnosis not present

## 2019-08-22 DIAGNOSIS — Z0000 Encounter for general adult medical examination without abnormal findings: Secondary | ICD-10-CM

## 2019-08-22 DIAGNOSIS — I129 Hypertensive chronic kidney disease with stage 1 through stage 4 chronic kidney disease, or unspecified chronic kidney disease: Secondary | ICD-10-CM | POA: Diagnosis not present

## 2019-08-22 DIAGNOSIS — N183 Chronic kidney disease, stage 3 unspecified: Secondary | ICD-10-CM | POA: Diagnosis not present

## 2019-08-22 DIAGNOSIS — I255 Ischemic cardiomyopathy: Secondary | ICD-10-CM | POA: Diagnosis not present

## 2019-08-22 DIAGNOSIS — F333 Major depressive disorder, recurrent, severe with psychotic symptoms: Secondary | ICD-10-CM | POA: Diagnosis not present

## 2019-08-22 DIAGNOSIS — F419 Anxiety disorder, unspecified: Secondary | ICD-10-CM

## 2019-08-22 DIAGNOSIS — N3000 Acute cystitis without hematuria: Secondary | ICD-10-CM | POA: Diagnosis not present

## 2019-08-22 LAB — POCT URINALYSIS DIPSTICK
Glucose, UA: NEGATIVE
Ketones, UA: NEGATIVE
Nitrite, UA: POSITIVE
Protein, UA: POSITIVE — AB
Spec Grav, UA: 1.015 (ref 1.010–1.025)
Urobilinogen, UA: 0.2 E.U./dL
pH, UA: 6 (ref 5.0–8.0)

## 2019-08-22 MED ORDER — CEPHALEXIN 500 MG PO CAPS: 500.0000 mg | ORAL_CAPSULE | Freq: Three times a day (TID) | ORAL | 0 refills | Status: DC

## 2019-08-22 MED ORDER — VENLAFAXINE HCL ER 75 MG PO CP24: 150.0000 mg | ORAL_CAPSULE | Freq: Every day | ORAL | Status: DC

## 2019-08-22 MED ORDER — AMLODIPINE BESYLATE 10 MG PO TABS: 5.0000 mg | ORAL_TABLET | Freq: Every day | ORAL | 0 refills | Status: DC

## 2019-08-22 NOTE — Patient Instructions (Signed)
[name] , Thank you for taking time to come for your Medicare Wellness Visit. I appreciate your ongoing commitment to your health goals. Please review the following plan we discussed and let me know if I can assist you in the future.   Screening recommendations/referrals: Colonoscopy: Up to date, due 01/2023 Mammogram: Up to date, due 05/2020 Recommended yearly ophthalmology/optometry visit for glaucoma screening and checkup Recommended yearly dental visit for hygiene and checkup  Vaccinations: Influenza vaccine: Done 11/04/18 Tdap vaccine: Up to date, due 09/2021    Advanced directives: Currently on file.  Conditions/risks identified: Fall risk prevention discussed today. Recommend to increase water intake to 6-8 8 oz glasses a day.   Next appointment: 9:40 AM today with Dr Caryn Section   Preventive Care 40-64 Years, Female Preventive care refers to lifestyle choices and visits with your health care provider that can promote health and wellness. What does preventive care include?  A yearly physical exam. This is also called an annual well check.  Dental exams once or twice a year.  Routine eye exams. Ask your health care provider how often you should have your eyes checked.  Personal lifestyle choices, including:  Daily care of your teeth and gums.  Regular physical activity.  Eating a healthy diet.  Avoiding tobacco and drug use.  Limiting alcohol use.  Practicing safe sex.  Taking low-dose aspirin daily starting at age 59.  Taking vitamin and mineral supplements as recommended by your health care provider. What happens during an annual well check? The services and screenings done by your health care provider during your annual well check will depend on your age, overall health, lifestyle risk factors, and family history of disease. Counseling  Your health care provider may ask you questions about your:  Alcohol use.  Tobacco use.  Drug use.  Emotional  well-being.  Home and relationship well-being.  Sexual activity.  Eating habits.  Work and work Statistician.  Method of birth control.  Menstrual cycle.  Pregnancy history. Screening  You may have the following tests or measurements:  Height, weight, and BMI.  Blood pressure.  Lipid and cholesterol levels. These may be checked every 5 years, or more frequently if you are over 93 years old.  Skin check.  Lung cancer screening. You may have this screening every year starting at age 66 if you have a 30-pack-year history of smoking and currently smoke or have quit within the past 15 years.  Fecal occult blood test (FOBT) of the stool. You may have this test every year starting at age 11.  Flexible sigmoidoscopy or colonoscopy. You may have a sigmoidoscopy every 5 years or a colonoscopy every 10 years starting at age 50.  Hepatitis C blood test.  Hepatitis B blood test.  Sexually transmitted disease (STD) testing.  Diabetes screening. This is done by checking your blood sugar (glucose) after you have not eaten for a while (fasting). You may have this done every 1-3 years.  Mammogram. This may be done every 1-2 years. Talk to your health care provider about when you should start having regular mammograms. This may depend on whether you have a family history of breast cancer.  BRCA-related cancer screening. This may be done if you have a family history of breast, ovarian, tubal, or peritoneal cancers.  Pelvic exam and Pap test. This may be done every 3 years starting at age 17. Starting at age 75, this may be done every 5 years if you have a Pap test  in combination with an HPV test.  Bone density scan. This is done to screen for osteoporosis. You may have this scan if you are at high risk for osteoporosis. Discuss your test results, treatment options, and if necessary, the need for more tests with your health care provider. Vaccines  Your health care provider may recommend  certain vaccines, such as:  Influenza vaccine. This is recommended every year.  Tetanus, diphtheria, and acellular pertussis (Tdap, Td) vaccine. You may need a Td booster every 10 years.  Zoster vaccine. You may need this after age 54.  Pneumococcal 13-valent conjugate (PCV13) vaccine. You may need this if you have certain conditions and were not previously vaccinated.  Pneumococcal polysaccharide (PPSV23) vaccine. You may need one or two doses if you smoke cigarettes or if you have certain conditions. Talk to your health care provider about which screenings and vaccines you need and how often you need them. This information is not intended to replace advice given to you by your health care provider. Make sure you discuss any questions you have with your health care provider. Document Released: 02/15/2015 Document Revised: 10/09/2015 Document Reviewed: 11/20/2014 Elsevier Interactive Patient Education  2017 [place] Prevention in the Home Falls can cause injuries. They can happen to people of all ages. There are many things you can do to make your home safe and to help prevent falls. What can I do on the outside of my home?  Regularly fix the edges of walkways and driveways and fix any cracks.  Remove anything that might make you trip as you walk through a door, such as a raised step or threshold.  Trim any bushes or trees on the path to your home.  Use bright outdoor lighting.  Clear any walking paths of anything that might make someone trip, such as rocks or tools.  Regularly check to see if handrails are loose or broken. Make sure that both sides of any steps have handrails.  Any raised decks and porches should have guardrails on the edges.  Have any leaves, snow, or ice cleared regularly.  Use sand or salt on walking paths during winter.  Clean up any spills in your garage right away. This includes oil or grease spills. What can I do in the bathroom?  Use  night lights.  Install grab bars by the toilet and in the tub and shower. Do not use towel bars as grab bars.  Use non-skid mats or decals in the tub or shower.  If you need to sit down in the shower, use a plastic, non-slip stool.  Keep the floor dry. Clean up any water that spills on the floor as soon as it happens.  Remove soap buildup in the tub or shower regularly.  Attach bath mats securely with double-sided non-slip rug tape.  Do not have throw rugs and other things on the floor that can make you trip. What can I do in the bedroom?  Use night lights.  Make sure that you have a light by your bed that is easy to reach.  Do not use any sheets or blankets that are too big for your bed. They should not hang down onto the floor.  Have a firm chair that has side arms. You can use this for support while you get dressed.  Do not have throw rugs and other things on the floor that can make you trip. What can I do in the kitchen?  Clean up any  spills right away.  Avoid walking on wet floors.  Keep items that you use a lot in easy-to-reach places.  If you need to reach something above you, use a strong step stool that has a grab bar.  Keep electrical cords out of the way.  Do not use floor polish or wax that makes floors slippery. If you must use wax, use non-skid floor wax.  Do not have throw rugs and other things on the floor that can make you trip. What can I do with my stairs?  Do not leave any items on the stairs.  Make sure that there are handrails on both sides of the stairs and use them. Fix handrails that are broken or loose. Make sure that handrails are as long as the stairways.  Check any carpeting to make sure that it is firmly attached to the stairs. Fix any carpet that is loose or worn.  Avoid having throw rugs at the top or bottom of the stairs. If you do have throw rugs, attach them to the floor with carpet tape.  Make sure that you have a light switch at  the top of the stairs and the bottom of the stairs. If you do not have them, ask someone to add them for you. What else can I do to help prevent falls?  Wear shoes that:  Do not have high heels.  Have rubber bottoms.  Are comfortable and fit you well.  Are closed at the toe. Do not wear sandals.  If you use a stepladder:  Make sure that it is fully opened. Do not climb a closed stepladder.  Make sure that both sides of the stepladder are locked into place.  Ask someone to hold it for you, if possible.  Clearly mark and make sure that you can see:  Any grab bars or handrails.  First and last steps.  Where the edge of each step is.  Use tools that help you move around (mobility aids) if they are needed. These include:  Canes.  Walkers.  Scooters.  Crutches.  Turn on the lights when you go into a dark area. Replace any light bulbs as soon as they burn out.  Set up your furniture so you have a clear path. Avoid moving your furniture around.  If any of your floors are uneven, fix them.  If there are any pets around you, be aware of where they are.  Review your medicines with your doctor. Some medicines can make you feel dizzy. This can increase your chance of falling. Ask your doctor what other things that you can do to help prevent falls. This information is not intended to replace advice given to you by your health care provider. Make sure you discuss any questions you have with your health care provider. Document Released: 11/15/2008 Document Revised: 06/27/2015 Document Reviewed: 02/23/2014 Elsevier Interactive Patient Education  2017 [employer].

## 2019-08-22 NOTE — Telephone Encounter (Signed)
Patient advised as below. Patient reports refill should have been sent to Dr. Donivan Scull office. Patient reports that she is not sure if she takes amlodipine 5 mg or 10 mg. Advised pt to call Dr. Donivan Scull office to clarify dose and have them send in a prescription to pharmacy.

## 2019-08-22 NOTE — Telephone Encounter (Signed)
*  STAT* If patient is at the pharmacy, call can be transferred to refill team.   1. Which medications need to be refilled? (please list name of each medication and dose if known) amlodipine 10 MG daily  2. Which pharmacy/location (including street and city if local pharmacy) is medication to be sent to? [facility] on [address]   3. Do they need a 30 day or 90 day supply? 30 day

## 2019-08-22 NOTE — Patient Instructions (Signed)
.   Please review the attached list of medications and notify my office if there are any errors.   . Start taking 2 venlafaxine every morning instead of one. Let me know if you have any problems or side effects from the higher dose.    Check your blood pressure at home three times a week and let me know if you see it consistently above 150/100 before your next appointment

## 2019-08-22 NOTE — Telephone Encounter (Signed)
amLODipine (NORVASC) 10 MG tablet 45 tablet 0 08/22/2019    Sig - Route: Take 0.5 tablets (5 mg total) by mouth daily. - Oral   Sent to pharmacy as: amLODipine (NORVASC) 10 MG tablet   E-Prescribing Status: Receipt confirmed by pharmacy (08/22/2019  4:26 PM EDT)   Pharmacy  [facility] [place] [facility]), [place] - [address]

## 2019-08-22 NOTE — Telephone Encounter (Signed)
Called patient.  Verified patient dose on Amlodipine.  Patient states that she was taking 5 MG but her last bottle says 10 MG and that is what she has been taking.   Per last office note it stated she takes Amlodipine 5 MG.  I will send in Amlodipine 10 MG as she is taking.   Marland Kitchen

## 2019-08-22 NOTE — Telephone Encounter (Signed)
Received faxed refill request for amlodipine from [facility] [place], but this is usually prescribed by Dr. [name]. I can't tell from medication if she is still taking this and what dose. The last note from dr [name] said she was taking 5mg , but I don't see any recent refills.   Please check with patient and have her double check her medications and see if she is still taking amlodipine and what dose she is taking

## 2019-08-25 ENCOUNTER — Telehealth: Payer: Self-pay | Admitting: Cardiovascular Disease

## 2019-08-25 DIAGNOSIS — G63 Polyneuropathy in diseases classified elsewhere: Secondary | ICD-10-CM | POA: Diagnosis not present

## 2019-08-25 DIAGNOSIS — R2689 Other abnormalities of gait and mobility: Secondary | ICD-10-CM | POA: Diagnosis not present

## 2019-08-25 DIAGNOSIS — G43019 Migraine without aura, intractable, without status migrainosus: Secondary | ICD-10-CM | POA: Diagnosis not present

## 2019-08-25 DIAGNOSIS — G5603 Carpal tunnel syndrome, bilateral upper limbs: Secondary | ICD-10-CM | POA: Diagnosis not present

## 2019-08-25 DIAGNOSIS — R9082 White matter disease, unspecified: Secondary | ICD-10-CM | POA: Diagnosis not present

## 2019-08-25 DIAGNOSIS — I639 Cerebral infarction, unspecified: Secondary | ICD-10-CM | POA: Diagnosis not present

## 2019-08-25 DIAGNOSIS — R404 Transient alteration of awareness: Secondary | ICD-10-CM | POA: Diagnosis not present

## 2019-08-25 DIAGNOSIS — S63656D Sprain of metacarpophalangeal joint of right little finger, subsequent encounter: Secondary | ICD-10-CM | POA: Diagnosis not present

## 2019-08-25 LAB — URINE CULTURE

## 2019-08-25 NOTE — Telephone Encounter (Signed)
Patient requesting Refill for Plavix.   It looks like it was discontinued at last OBGYN visit.   Can you please review and confirm before I call the patient.

## 2019-08-25 NOTE — Telephone Encounter (Signed)
*  STAT* If patient is at the pharmacy, call can be transferred to refill team.   1. Which medications need to be refilled? (please list name of each medication and dose if known) Plavix (not on med list ?)  2. Which pharmacy/location (including street and city if local pharmacy) is medication to be sent to? [facility] on [address]   3. Do they need a 30 day or 90 day supply? 30 day

## 2019-08-27 ENCOUNTER — Encounter: Payer: Self-pay | Admitting: Family Medicine

## 2019-08-28 ENCOUNTER — Telehealth: Payer: Self-pay

## 2019-08-28 DIAGNOSIS — N3001 Acute cystitis with hematuria: Secondary | ICD-10-CM

## 2019-08-28 MED ORDER — NITROFURANTOIN MONOHYD MACRO 100 MG PO CAPS: 100.0000 mg | ORAL_CAPSULE | Freq: Two times a day (BID) | ORAL | 0 refills | Status: DC

## 2019-08-28 NOTE — Telephone Encounter (Signed)
-----   Message from [name], MD sent at 08/25/2019  1:26 PM EDT ----- UTI is resistant to antibiotic that was prescribed, please advise and send in new prescription for nitrofurantoin 100mg  twice daily for 7 days

## 2019-08-28 NOTE — Telephone Encounter (Signed)
Patient advised and verbalized understanding. Prescription sent into pharmacy.  

## 2019-08-31 ENCOUNTER — Other Ambulatory Visit: Payer: Self-pay | Admitting: Otolaryngology

## 2019-08-31 ENCOUNTER — Other Ambulatory Visit (HOSPITAL_COMMUNITY): Payer: Self-pay | Admitting: Otolaryngology

## 2019-08-31 DIAGNOSIS — R490 Dysphonia: Secondary | ICD-10-CM | POA: Diagnosis not present

## 2019-08-31 DIAGNOSIS — J392 Other diseases of pharynx: Secondary | ICD-10-CM

## 2019-08-31 DIAGNOSIS — R221 Localized swelling, mass and lump, neck: Secondary | ICD-10-CM | POA: Diagnosis not present

## 2019-08-31 DIAGNOSIS — R1314 Dysphagia, pharyngoesophageal phase: Secondary | ICD-10-CM | POA: Diagnosis not present

## 2019-08-31 NOTE — Telephone Encounter (Signed)
Spoke with patient and she does not recall who stopped her plavix or the last dose. She states that she did got to Dr. Manuella Ghazi due to memory issues and they state she is having recurrent strokes and said that she has dementia. Reviewed chart and I see where it was held for GI procedure. Advised that I no longer see it on her list and will need to check with provider. Her memory is noticeable impaired and she was unable to recall some information. Let her know that I would route to provider for review and recommendations and would be in touch with her if she should still be on this medication. She verbalized understanding with no further questions at this time.

## 2019-09-01 ENCOUNTER — Other Ambulatory Visit: Payer: Self-pay

## 2019-09-01 ENCOUNTER — Observation Stay (HOSPITAL_BASED_OUTPATIENT_CLINIC_OR_DEPARTMENT_OTHER)
Admission: EM | Admit: 2019-09-01 | Discharge: 2019-09-01 | Disposition: A | Discharge status: Home / Self Care | Payer: Medicare Other | Source: Home / Self Care | Attending: Emergency Medicine | Admitting: Emergency Medicine

## 2019-09-01 ENCOUNTER — Emergency Department: Payer: Medicare Other

## 2019-09-01 ENCOUNTER — Encounter: Payer: Self-pay | Admitting: Emergency Medicine

## 2019-09-01 DIAGNOSIS — Z7982 Long term (current) use of aspirin: Secondary | ICD-10-CM | POA: Insufficient documentation

## 2019-09-01 DIAGNOSIS — I10 Essential (primary) hypertension: Secondary | ICD-10-CM | POA: Insufficient documentation

## 2019-09-01 DIAGNOSIS — E162 Hypoglycemia, unspecified: Secondary | ICD-10-CM | POA: Diagnosis not present

## 2019-09-01 DIAGNOSIS — I130 Hypertensive heart and chronic kidney disease with heart failure and stage 1 through stage 4 chronic kidney disease, or unspecified chronic kidney disease: Secondary | ICD-10-CM | POA: Diagnosis not present

## 2019-09-01 DIAGNOSIS — R0789 Other chest pain: Secondary | ICD-10-CM | POA: Diagnosis not present

## 2019-09-01 DIAGNOSIS — I25110 Atherosclerotic heart disease of native coronary artery with unstable angina pectoris: Secondary | ICD-10-CM | POA: Diagnosis not present

## 2019-09-01 DIAGNOSIS — R079 Chest pain, unspecified: Secondary | ICD-10-CM | POA: Diagnosis not present

## 2019-09-01 DIAGNOSIS — D509 Iron deficiency anemia, unspecified: Secondary | ICD-10-CM | POA: Insufficient documentation

## 2019-09-01 DIAGNOSIS — Z87891 Personal history of nicotine dependence: Secondary | ICD-10-CM | POA: Insufficient documentation

## 2019-09-01 DIAGNOSIS — R001 Bradycardia, unspecified: Secondary | ICD-10-CM | POA: Diagnosis not present

## 2019-09-01 DIAGNOSIS — Z86010 Personal history of colonic polyps: Secondary | ICD-10-CM | POA: Insufficient documentation

## 2019-09-01 DIAGNOSIS — Z20822 Contact with and (suspected) exposure to covid-19: Secondary | ICD-10-CM | POA: Insufficient documentation

## 2019-09-01 DIAGNOSIS — E11649 Type 2 diabetes mellitus with hypoglycemia without coma: Secondary | ICD-10-CM | POA: Diagnosis not present

## 2019-09-01 DIAGNOSIS — E46 Unspecified protein-calorie malnutrition: Secondary | ICD-10-CM | POA: Diagnosis not present

## 2019-09-01 DIAGNOSIS — N390 Urinary tract infection, site not specified: Secondary | ICD-10-CM | POA: Diagnosis not present

## 2019-09-01 DIAGNOSIS — I5032 Chronic diastolic (congestive) heart failure: Secondary | ICD-10-CM | POA: Diagnosis not present

## 2019-09-01 LAB — BASIC METABOLIC PANEL
Anion gap: 7 (ref 5–15)
BUN: 30 mg/dL — ABNORMAL HIGH (ref 6–20)
CO2: 18 mmol/L — ABNORMAL LOW (ref 22–32)
Calcium: 8.2 mg/dL — ABNORMAL LOW (ref 8.9–10.3)
Chloride: 114 mmol/L — ABNORMAL HIGH (ref 98–111)
Creatinine, Ser: 1.34 mg/dL — ABNORMAL HIGH (ref 0.44–1.00)
GFR calc Af Amer: 53 mL/min — ABNORMAL LOW (ref >60)
GFR calc non Af Amer: 46 mL/min — ABNORMAL LOW (ref >60)
Glucose, Bld: 65 mg/dL — ABNORMAL LOW (ref 70–99)
Potassium: 3.5 mmol/L (ref 3.5–5.1)
Sodium: 139 mmol/L (ref 135–145)

## 2019-09-01 LAB — TROPONIN I (HIGH SENSITIVITY)
Troponin I (High Sensitivity): 9 ng/L (ref <18)
Troponin I (High Sensitivity): 8 ng/L (ref <18)
Troponin I (High Sensitivity): 7 ng/L (ref <18)

## 2019-09-01 LAB — CBC
HCT: 37 % (ref 36.0–46.0)
Hemoglobin: 12.2 g/dL (ref 12.0–15.0)
MCH: 27.7 pg (ref 26.0–34.0)
MCHC: 33 g/dL (ref 30.0–36.0)
MCV: 84.1 fL (ref 80.0–100.0)
Platelets: 270 10*3/uL (ref 150–400)
RBC: 4.4 MIL/uL (ref 3.87–5.11)
RDW: 14 % (ref 11.5–15.5)
WBC: 12.5 10*3/uL — ABNORMAL HIGH (ref 4.0–10.5)
nRBC: 0 % (ref 0.0–0.2)

## 2019-09-01 LAB — SARS CORONAVIRUS 2 BY RT PCR (HOSPITAL ORDER, PERFORMED IN ~~LOC~~ HOSPITAL LAB): SARS Coronavirus 2: NEGATIVE

## 2019-09-01 LAB — GLUCOSE, CAPILLARY
Glucose-Capillary: 48 mg/dL — ABNORMAL LOW (ref 70–99)
Glucose-Capillary: 95 mg/dL (ref 70–99)
Glucose-Capillary: 29 mg/dL — CL (ref 70–99)
Glucose-Capillary: 153 mg/dL — ABNORMAL HIGH (ref 70–99)

## 2019-09-01 MED ORDER — ACETAMINOPHEN 325 MG PO TABS: 650.0000 mg | ORAL_TABLET | ORAL | Status: DC | PRN

## 2019-09-01 MED ORDER — ENOXAPARIN SODIUM 40 MG/0.4ML ~~LOC~~ SOLN: 40.0000 mg | SUBCUTANEOUS | Status: DC

## 2019-09-01 MED ORDER — SODIUM CHLORIDE 0.9% FLUSH: 3.0000 mL | Freq: Once | INTRAVENOUS | Status: DC

## 2019-09-01 MED ORDER — DEXTROSE 50 % IV SOLN
50.0000 mL | Freq: Once | INTRAVENOUS | Status: AC
  Administered 2019-09-01: 50 mL via INTRAVENOUS

## 2019-09-01 MED ORDER — ONDANSETRON HCL 4 MG/2ML IJ SOLN: 4.0000 mg | Freq: Four times a day (QID) | INTRAMUSCULAR | Status: DC | PRN

## 2019-09-01 MED ORDER — DEXTROSE 50 % IV SOLN
INTRAVENOUS | Status: AC
  Filled 2019-09-01: qty 50

## 2019-09-01 NOTE — H&P (Signed)
[name] NAME: [name]    MR#:  [id number]  DATE OF BIRTH:  07/20/1970  DATE OF ADMISSION:  09/01/2019  PRIMARY CARE PHYSICIAN: [name], MD   REQUESTING/REFERRING PHYSICIAN: Dr Jacqualine Code  Patient coming from : home   CHIEF COMPLAINT:   Chest pain and low sugar HISTORY OF PRESENT ILLNESS:  [name]  is a 49 y.o. female with a known history of gastric bypass about 12 years ago, iron deficiency anemia follows at the cancer center, type II diabetes off oral medication, coronary artery disease status post stent in the past, history of CVA, hypertension comes to the emergency room with complaints of chest pain left-sided nonradiating with some diaphoresis which will corrupt. Patient checked her sugars at that time was 65. She repeated and was 79 given her chest pain she came to the emergency room.  ED course: in the ER patient's initial sugar was 90. Repeated was 29 she received an empathy 50. Patient remained asymptomatic. Currently last sugars 153 EKG within normal limits. Troponin times two is negative  Patient is being admitted for chest pain and hypoglycemia  Patient tells me she has been skipping her meals. She only has snacks. She skips breakfast does not take her first meal till like noon or 1 PM. She has been chewing some glucagon tablets because of her low sugar. She was taken off Metformin two months ago by her endocrinologist Dr. Jenetta Downer 'connell  Currently patient is asymptomatic. Son is in the ER. PAST MEDICAL HISTORY:   Past Medical History:  Diagnosis Date  . Acute colitis 01/27/2017  . Acute pyelonephritis   . Anemia    iron deficiency anemia  . Aortic arch aneurysm ([place])   . BRCA negative 2014  . CAD (coronary artery disease)    a. 08/2003 Cath: LAD 30-40-med Rx; b. 11/2014 PCI: LAD 54m(3.25x23 Xience Alpine DES); c. 06/2015 PCI: D1 (2.25x12 Resolute Integrity DES); d. 06/2017 PCI: Patent mLAD  stent, D2 95 (PTCA); e. 09/2017 PCI: D2 99ost (CBA); d. 12/2017 Cath: LM nl, LAD 320m80d (small), D1 40ost, D2 95ost, LCX 40p, RCA 40ost/p->Med rx for D2 given restenosis.  . Colitis 06/03/2015  . Colon polyp   . CVA (cerebral vascular accident) ([facility]   Left side weakness.   . Degenerative tear of glenoid labrum of right shoulder 03/15/2017  . Diabetes mellitus without complication ([facility]  . Family history of breast cancer    BRCA neg 2014  . Femur fracture, left ([facility]9/10/2018  . Gastric ulcer 04/27/2011  . History of echocardiogram    a. 03/2017 Echo: EF 60-65%, no rwma; b. 02/2018 Echo: EF 60-65%, no rwma. Nl RV fxn. No cardiac source of emboli (admitted w/ stroke).  . Hypertension   . Malignant melanoma of skin of scalp ([facility]  . MI, acute, non ST segment elevation ([facility]  . Neuromuscular disorder ([facility]  . S/P drug eluting coronary stent placement 06/04/2015  . Sepsis ([facility]2/14/2019  . St[facility] ([facility]   a. 02/2018 MRI: 53m91mate acute/early subacute L medial frontal lobe inarct; b. 02/2018 MRA No large vessel occlusion or aneurysm. Mod to sev L P2 stenosis. thready L vertebral artery, diffusely dzs'd; c. 02/2018 Carotid U/S: <50% bilat ICA dzs.    PAST SURGICAL HISTOIRY:   Past Surgical History:  Procedure Laterality Date  . APPENDECTOMY    . CARDIAC CATHETERIZATION N/A 11/09/2014  Procedure: Coronary Angiography;  Surgeon: Minna Merritts, MD;  Location: [place] [facility];  Service: Cardiovascular;  Laterality: N/A;  . CARDIAC CATHETERIZATION N/A 11/12/2014   Procedure: Coronary Stent Intervention;  Surgeon: Isaias Cowman, MD;  Location: [place] [facility];  Service: Cardiovascular;  Laterality: N/A;  . CARDIAC CATHETERIZATION N/A 04/18/2015   Procedure: Left Heart Cath and Coronary Angiography;  Surgeon: Minna Merritts, MD;  Location: [place] [facility];  Service: Cardiovascular;  Laterality: N/A;  . CARDIAC CATHETERIZATION Left 06/04/2015   Procedure: Left Heart Cath and  Coronary Angiography;  Surgeon: Wellington Hampshire, MD;  Location: [place] [facility];  Service: Cardiovascular;  Laterality: Left;  . CARDIAC CATHETERIZATION N/A 06/04/2015   Procedure: Coronary Stent Intervention;  Surgeon: Wellington Hampshire, MD;  Location: [place] [facility];  Service: Cardiovascular;  Laterality: N/A;  . CESAREAN SECTION  2001  . CHOLECYSTECTOMY N/A 11/18/2016   Procedure: LAPAROSCOPIC CHOLECYSTECTOMY WITH INTRAOPERATIVE CHOLANGIOGRAM;  Surgeon: Christene Lye, MD;  Location: [facility];  Service: General;  Laterality: N/A;  . COLONOSCOPY WITH PROPOFOL N/A 04/27/2016   Procedure: COLONOSCOPY WITH PROPOFOL;  Surgeon: [name], MD;  Location: [place];  Service: Endoscopy;  Laterality: N/A;  . COLONOSCOPY WITH PROPOFOL N/A 01/12/2018   Procedure: COLONOSCOPY WITH PROPOFOL;  Surgeon: Toledo, Benay Pike, MD;  Location: [facility];  Service: Endoscopy;  Laterality: N/A;  . CORONARY ANGIOPLASTY    . CORONARY BALLOON ANGIOPLASTY N/A 06/29/2017   Procedure: CORONARY BALLOON ANGIOPLASTY;  Surgeon: Wellington Hampshire, MD;  Location: [place] [facility];  Service: Cardiovascular;  Laterality: N/A;  . CORONARY BALLOON ANGIOPLASTY N/A 09/20/2017   Procedure: CORONARY BALLOON ANGIOPLASTY;  Surgeon: Wellington Hampshire, MD;  Location: [place] [facility];  Service: Cardiovascular;  Laterality: N/A;  . DILATION AND CURETTAGE OF UTERUS    . ESOPHAGOGASTRODUODENOSCOPY (EGD) WITH PROPOFOL N/A 09/14/2014   Procedure: ESOPHAGOGASTRODUODENOSCOPY (EGD) WITH PROPOFOL;  Surgeon: Josefine Class, MD;  [facility];  Service: Endoscopy;  Laterality: N/A;  . ESOPHAGOGASTRODUODENOSCOPY (EGD) WITH PROPOFOL N/A 04/27/2016   Procedure: ESOPHAGOGASTRODUODENOSCOPY (EGD) WITH PROPOFOL;  Surgeon: [name], MD;  Location: [place];  Service: Endoscopy;  Laterality: N/A;  Diabetic - oral meds  . ESOPHAGOGASTRODUODENOSCOPY (EGD) WITH PROPOFOL N/A 01/12/2018    Procedure: ESOPHAGOGASTRODUODENOSCOPY (EGD) WITH PROPOFOL;  Surgeon: Toledo, Benay Pike, MD;  Location: [facility];  Service: Endoscopy;  Laterality: N/A;  . ESOPHAGOGASTRODUODENOSCOPY (EGD) WITH PROPOFOL N/A 04/11/2019   Procedure: ESOPHAGOGASTRODUODENOSCOPY (EGD) WITH PROPOFOL;  Surgeon: [name], MD;  Location: [facility];  Service: Endoscopy;  Laterality: N/A;  . GASTRIC BYPASS  09/2009   [place] (IM) NAIL INTERTROCHANTERIC Left 10/13/2018   Procedure: INTRAMEDULLARY (IM) NAIL INTERTROCHANTRIC;  Surgeon: Leandrew Koyanagi, MD;  Location: [place];  Service: Orthopedics;  Laterality: Left;  . Left Carotid to sublcavian artery bypass w/ subclavian artery ligation     a. Performed @ [facility].  . LEFT HEART CATH AND CORONARY ANGIOGRAPHY Left 06/29/2017   Procedure: LEFT HEART CATH AND CORONARY ANGIOGRAPHY;  Surgeon: Wellington Hampshire, MD;  Location: [place] [facility];  Service: Cardiovascular;  Laterality: Left;  . LEFT HEART CATH AND CORONARY ANGIOGRAPHY N/A 09/20/2017   Procedure: LEFT HEART CATH AND CORONARY ANGIOGRAPHY;  Surgeon: Wellington Hampshire, MD;  Location: [place] [facility];  Service: Cardiovascular;  Laterality: N/A;  . LEFT HEART CATH AND CORONARY ANGIOGRAPHY N/A 12/20/2017   Procedure: LEFT HEART CATH  AND CORONARY ANGIOGRAPHY;  Surgeon: Wellington Hampshire, MD;  Location: [place] [facility];  Service: Cardiovascular;  Laterality: N/A;  . LEFT HEART CATH AND CORONARY ANGIOGRAPHY N/A 04/20/2019   Procedure: LEFT HEART CATH AND CORONARY ANGIOGRAPHY possible PCI;  Surgeon: Minna Merritts, MD;  Location: [place] [facility];  Service: Cardiovascular;  Laterality: N/A;  . MELANOMA EXCISION  2016   Dr. Evorn Gong  . [place]  2002  . RIGHT OOPHORECTOMY    . SHOULDER ARTHROSCOPY WITH OPEN ROTATOR CUFF REPAIR Right 01/07/2016   Procedure: SHOULDER ARTHROSCOPY WITH DEBRIDMENT, SUBACHROMIAL DECOMPRESSION;  Surgeon: Corky Mull, MD;   Location: [facility];  Service: Orthopedics;  Laterality: Right;  . SHOULDER ARTHROSCOPY WITH OPEN ROTATOR CUFF REPAIR Right 03/16/2017   Procedure: SHOULDER ARTHROSCOPY WITH OPEN ROTATOR CUFF REPAIR POSSIBLE BICEPS TENODESIS;  Surgeon: Corky Mull, MD;  Location: [facility];  Service: Orthopedics;  Laterality: Right;  . TRIGGER FINGER RELEASE Right     [place]    SOCIAL HISTORY:   Social History   Tobacco Use  . Smoking status: Former Smoker    Types: Cigarettes    Quit date: 08/31/1994    Years since quitting: 25.0  . Smokeless tobacco: Never Used  . Tobacco comment: quit 28 years ago  Substance Use Topics  . Alcohol use: No    Alcohol/week: 0.0 standard drinks    FAMILY HISTORY:   Family History  Problem Relation Age of Onset  . Hypertension Mother   . Anxiety disorder Mother   . Depression Mother   . Bipolar disorder Mother   . Heart disease Mother        No details  . Hyperlipidemia Mother   . Kidney disease Father   . Heart disease Father 67  . Hypertension Father   . Diabetes Father   . Stroke Father   . Colon cancer Father        dx in his 38's  . Anxiety disorder Father   . Depression Father   . Skin cancer Father   . Kidney disease Sister   . Thyroid nodules Sister   . Hypertension Sister   . Hypertension Sister   . Diabetes Sister   . Hyperlipidemia Sister   . Depression Sister   . Breast cancer Maternal Aunt 59  . Breast cancer Maternal Aunt 72  . Ovarian cancer Cousin   . Colon cancer Cousin   . Kidney cancer Cousin   . Breast cancer Other   . Bladder Cancer Neg Hx     DRUG ALLERGIES:   Allergies  Allergen Reactions  . Lipitor [Atorvastatin] Other (See Comments)    Leg pains  . Tramadol Other (See Comments)    Mouth feels like it's on fire    REVIEW OF SYSTEMS:  Review of Systems  Constitutional: Negative for chills, fever and weight loss.  HENT: Negative for ear discharge, ear pain and nosebleeds.   Eyes: Negative for blurred  vision, pain and discharge.  Respiratory: Negative for sputum production, shortness of breath, wheezing and stridor.   Cardiovascular: Positive for chest pain. Negative for palpitations, orthopnea and PND.  Gastrointestinal: Negative for abdominal pain, diarrhea, nausea and vomiting.  Genitourinary: Negative for frequency and urgency.  Musculoskeletal: Negative for back pain and joint pain.  Neurological: Positive for weakness. Negative for sensory change, speech change and focal weakness.  Psychiatric/Behavioral: Negative for depression and hallucinations. The patient is not nervous/anxious.      MEDICATIONS AT HOME:  Prior to Admission medications   Medication Sig Start Date End Date Taking? Authorizing Provider  ALPRAZolam Duanne Moron) 1 MG tablet Take 1 tablet by mouth twice daily as needed 08/14/19   [name], MD  amLODipine (NORVASC) 10 MG tablet Take 0.5 tablets (5 mg total) by mouth daily. 08/22/19   Minna Merritts, MD  aspirin 81 MG EC tablet Take 81 mg by mouth daily.     [provider]  buPROPion (WELLBUTRIN XL) 300 MG 24 hr tablet Take 1 tablet (300 mg total) by mouth daily. 03/15/19   [name], MD  celecoxib (CELEBREX) 200 MG capsule Take 200 mg by mouth 2 (two) times daily. 08/10/19   [provider]  Cholecalciferol 50 MCG (2000 UT) TABS Take 2,000 Units by mouth daily.    [provider]  diphenoxylate-atropine (LOMOTIL) 2.5-0.025 MG tablet TAKE 1 TABLET BY MOUTH 1 TO 4 TIMES DAILY FOR DIARRHEA. 08/10/19   [name], MD  EUTHYROX 25 MCG tablet TAKE 1 TABLET BY MOUTH ONCE DAILY BEFORE BREAKFAST 08/01/19   [name], MD  gabapentin (NEURONTIN) 300 MG capsule Take 1 capsule (300 mg total) by mouth 2 (two) times daily. 10/12/18   [name], MD  isosorbide mononitrate (IMDUR) 60 MG 24 hr tablet Take 1 tablet (60 mg total) by mouth daily. 05/05/19   Minna Merritts, MD  losartan (COZAAR) 25 MG tablet Take 25 mg by mouth daily.  04/05/19   [provider]  Multiple Vitamin (MULTI-VITAMIN) tablet Take 1 tablet by mouth daily. occasionally    [provider]  nitrofurantoin, macrocrystal-monohydrate, (MACROBID) 100 MG capsule Take 1 capsule (100 mg total) by mouth 2 (two) times daily for 7 days. 08/28/19 09/04/19  [name], MD  nitroGLYCERIN (NITROSTAT) 0.4 MG SL tablet Place 1 tablet (0.4 mg total) under the tongue every 5 (five) minutes as needed for chest pain. 04/24/19   Minna Merritts, MD  pantoprazole (PROTONIX) 40 MG tablet Take 1 tablet by mouth twice daily 06/06/19   [name], MD  promethazine (PHENERGAN) 25 MG tablet Take 25 mg by mouth every 6 (six) hours as needed. 07/25/19   [provider]  propranolol ER (INDERAL LA) 60 MG 24 hr capsule Take 60 mg by mouth at bedtime. 06/05/19   [provider]  traZODone (DESYREL) 100 MG tablet Take 100 mg by mouth at bedtime as needed for sleep.  03/06/19   [provider]  venlafaxine XR (EFFEXOR-XR) 75 MG 24 hr capsule Take 2 capsules (150 mg total) by mouth daily with breakfast. 08/22/19   [name], MD      VITAL SIGNS:  Blood pressure 104/71, pulse 60, temperature 98 F (36.7 C), temperature source Oral, resp. rate (!) 10, height _0  (1.575 m), weight 65.8 kg, SpO2 100 %.  PHYSICAL EXAMINATION:  GENERAL:  49 y.o.-year-old patient lying in the bed with no acute distress.  EYES: Pupils equal, round, reactive to light and accommodation. No scleral icterus.  HEENT: Head atraumatic, normocephalic. Oropharynx and nasopharynx clear. edentulous NECK:  Supple, no jugular venous distention. No thyroid enlargement, no tenderness.  LUNGS: Normal breath sounds bilaterally, no wheezing, rales,rhonchi or crepitation. No use of accessory muscles of respiration.  CARDIOVASCULAR: S1, S2 normal. No murmurs, rubs, or gallops.  ABDOMEN: Soft, nontender, nondistended. Bowel sounds present. No organomegaly or mass.  EXTREMITIES:  No pedal edema, cyanosis, or clubbing.  NEUROLOGIC: Cranial nerves II through XII are intact.  Muscle strength 5/5 in all extremities. Sensation intact. Gait not checked.  PSYCHIATRIC: The patient is alert and oriented x 3.  SKIN: No obvious rash, lesion, or ulcer.   LABORATORY PANEL:   CBC Recent Labs  Lab 09/01/19 0817  WBC 12.5*  HGB 12.2  HCT 37.0  PLT 270   ------------------------------------------------------------------------------------------------------------------  Chemistries  Recent Labs  Lab 09/01/19 0817  NA 139  K 3.5  CL 114*  CO2 18*  GLUCOSE 65*  BUN 30*  CREATININE 1.34*  CALCIUM 8.2*   ------------------------------------------------------------------------------------------------------------------  Cardiac Enzymes No results for input(s): TROPONINI in the last 168 hours. ------------------------------------------------------------------------------------------------------------------  RADIOLOGY:  DG Chest 2 View  Result Date: 09/01/2019 CLINICAL DATA:  Chest pain EXAM: CHEST - 2 VIEW COMPARISON:  April 18, 2019 FINDINGS: Lungs are clear. Heart size and pulmonary vascularity are normal. Right-sided aortic arch noted. Postoperative change in the upper left paratracheal region noted. No adenopathy. No bone lesions. IMPRESSION: Right-sided aortic arch.  Heart size normal.  Lungs clear. Electronically Signed   By: Lowella Grip III M.D.   On: 09/01/2019 08:45    EKG:    IMPRESSION AND PLAN:   Lillien Petronio Tobin [name]  is a 49 y.o. female with a known history of gastric bypass about 12 years ago, iron deficiency anemia follows at the cancer center, type II diabetes off oral medication, coronary artery disease status post stent in the past, history of CVA, hypertension comes to the emergency room with complaints of chest pain left-sided nonradiating with some diaphoresis which will corrupt. Patient checked her sugars at that time was 65. She repeated and was  79.  Chest pain with history of coronary artery disease status post stenting in the past -so far troponin times two of been negative EKG no acute change- -continue aspirin, nitro, imdur and other cardiac meds -cardiology consultation with Dr. Fletcher Anon -cycle cardiac enzyme times two  Hypoglycemia with history of type II diabetes and gastric bypass -suspected due to poor eating habits -patient has been skipping meals. She eats her snacks here and there. She has issues with her teeth. -Dietitian consult -patient advised to have small frequent meals given her gastric bypass for digestion and prevent hypoglycemia -she is recommended to follow-up with endocrinology - she is currently off diabetic meds for two months -continue sugar check every four hours  Hypertension resume home meds  history of CVA continue aspirin  iron deficiency anemia follows with Dr. Grayland Ormond at the cancer center hemoglobin stable    Family Communication :son in er Consults :cardiolgoy Code Status :full DVT prophylaxis :lovenox admission status: observation  TOTAL TIME TAKING CARE OF THIS PATIENT: 45 minutes.    Fritzi Mandes M.D  [facility]     CC: Primary care physician; [name], MD

## 2019-09-01 NOTE — ED Notes (Signed)
Pt unhooked and walked to bathroom independently.

## 2019-09-01 NOTE — ED Notes (Signed)
Pt given meal tray and soda

## 2019-09-01 NOTE — Progress Notes (Signed)
Cross Cover Brief Note Called to bedside. Patient states she wants to sign out AMA.  She states her low blood sugars are related to her not eating secondary to throat pain and she is being followed by ENT.  She also states she has chronic chest pain that is managed with nitroglycerin and she does not have chest pain now. She said she will follow up with her PCP. Advised to return if her chest pain is not resolved with nitro at home should it come back.  Patient understand risk of hypoglycemia and possible ACS

## 2019-09-01 NOTE — ED Notes (Signed)
Pt feels like sugar low again, NT to check blood sugar.

## 2019-09-01 NOTE — ED Notes (Signed)
Pt felt like sugar dropping.  Gave juice and crackers with peanut butter.

## 2019-09-01 NOTE — ED Provider Notes (Signed)
[facility] Emergency Department Provider Note   ____________________________________________   First MD Initiated Contact with Patient 09/01/19 1415     (approximate)  I have reviewed the triage vital signs and the nursing notes.   HISTORY  Chief Complaint Chest Pain    HPI [place] is a 49 y.o. female here for evaluation of low blood sugar  Patient reports that she has been experiencing issues with very low blood sugars frequently.  She has to eat glucose tablets frequently throughout days.  Her blood sugars will drop unexpectedly at times.  She will still eat normal diet, she ate pizza last night.  This morning she woke up in a pool of sweat and dizzy, her blood sugar was very low and improved after taking glucose tablets  She is off all diabetes medications.  Had a previous gastric bypass.  She also reports that she has been having some intermittent chest pain off and on for several weeks, and some very mild.  She has not experienced any today.  She reports to me that the primary reason she came to the ER is because of issues with her low blood sugars to keep recurring and on Sunday she had an episode that was so severe she felt as though she was about to pass out  Past Medical History:  Diagnosis Date  . Acute colitis 01/27/2017  . Acute pyelonephritis   . Anemia    iron deficiency anemia  . Aortic arch aneurysm ([place])   . BRCA negative 2014  . CAD (coronary artery disease)    a. 08/2003 Cath: LAD 30-40-med Rx; b. 11/2014 PCI: LAD 373m(3.25x23 Xience Alpine DES); c. 06/2015 PCI: D1 (2.25x12 Resolute Integrity DES); d. 06/2017 PCI: Patent mLAD stent, D2 95 (PTCA); e. 09/2017 PCI: D2 99ost (CBA); d. 12/2017 Cath: LM nl, LAD 371m80d (small), D1 40ost, D2 95ost, LCX 40p, RCA 40ost/p->Med rx for D2 given restenosis.  . Colitis 06/03/2015  . Colon polyp   . CVA (cerebral vascular accident) (H[place]field [place]   Left side weakness.   . Degenerative tear of  glenoid labrum of right shoulder 03/15/2017  . Diabetes mellitus without complication (HC[place]sey [place]  . Family history of breast cancer    BRCA neg 2014  . Femur fracture, left (HCJacksonville9/10/2018  . Gastric ulcer 04/27/2011  . History of echocardiogram    a. 03/2017 Echo: EF 60-65%, no rwma; b. 02/2018 Echo: EF 60-65%, no rwma. Nl RV fxn. No cardiac source of emboli (admitted w/ stroke).  . Hypertension   . Malignant melanoma of skin of scalp (HCKanaw[place]  . MI, acute, non ST segment elevation (HCBeverly [place]  . Neuromuscular disorder (HCOcean [place]  . S/P drug eluting coronary stent placement 06/04/2015  . Sepsis (HCSharon2/14/2019  . St[facility] ([facility]   a. 02/2018 MRI: 73m773mate acute/early subacute L medial frontal lobe inarct; b. 02/2018 MRA No large vessel occlusion or aneurysm. Mod to sev L P2 stenosis. thready L vertebral artery, diffusely dzs'd; c. 02/2018 Carotid U/S: <50% bilat ICA dzs.    Patient Active Problem List   Diagnosis Date Noted  . Hypoglycemia   . Family history of breast cancer 05/23/2019  . Family history of colon cancer 05/23/2019  . Abnormal LFTs 04/18/2019  . Esophageal dysphagia   . Gastrointestinal tract imaging abnormality   . Trochanteric bursitis, left hip 03/17/2019  . Malignant neoplasm of vulva, unspecified (HCCPerkinsville2/06/2019  . Peripheral vascular disease, unspecified (HCCMesita  03/10/2019  . Paroxysmal supraventricular tachycardia ([place]) 03/10/2019  . Contusion of left hip 03/06/2019  . Chest pain, moderate coronary artery risk 01/27/2019  . Melena   . Hypertensive urgency   . Recurrent chest pain 01/19/2019  . Depression, major, single episode, moderate ([place]) 12/09/2018  . Closed nondisplaced intertrochanteric fracture of left femur ([place]) 10/12/2018  . Hypotension 06/26/2018  . Autonomic neuropathy 03/24/2018  . Acute delirium 03/03/2018  . H/O gastric bypass 03/02/2018  . Hypertension associated with stage 3 chronic kidney disease due to type 2 diabetes mellitus ([place]) 02/20/2018  . SI  (sacroiliac) joint dysfunction 12/02/2017  . Effort angina ([place]) 06/29/2017  . Unstable angina ([place]) 06/24/2017  . Syncope 04/09/2017  . Insomnia 03/18/2017  . Ischemic cardiomyopathy   . Arthritis   . Anxiety   . Tendinitis of upper biceps tendon of right shoulder 03/16/2017  . Hx of colonic polyps   . H/O medication noncompliance 12/14/2015  . CAD in native artery   . Hypertensive heart disease   . Type 2 DM with CKD stage 4 and hypertension ([place]) 06/05/2015  . Status post bariatric surgery 06/04/2015  . Carotid stenosis 04/30/2015  . Stable angina pectoris ([place]) 04/17/2015  . Iron deficiency anemia 03/22/2015  . Vitamin B12 deficiency 02/18/2015  . Misuse of medications for pain 02/18/2015  . Major depressive disorder, recurrent, severe with psychotic features ([place]) 02/15/2015  . Helicobacter pylori infection 11/23/2014  . Hemiparesis, left ([place]) 11/23/2014  . Malignant melanoma ([place]) 08/25/2014  . Chronic systolic CHF (congestive heart failure) ([place])   . Incomplete bladder emptying 07/12/2014  . Hypothyroidism 12/30/2013  . Aberrant subclavian artery 11/17/2013  . Multiple sclerosis ([place]) 11/02/2013  . History of CVA with residual deficit 06/20/2013  . Headache, migraine 05/29/2013  . Hyperlipidemia   . GERD (gastroesophageal reflux disease)   . Neuropathy ([place]) 01/02/2011  . Stroke ([place]) 06/21/2008  . Depression with anxiety 05/01/2008  . Essential hypertension 05/01/2008    Past Surgical History:  Procedure Laterality Date  . APPENDECTOMY    . CARDIAC CATHETERIZATION N/A 11/09/2014   Procedure: Coronary Angiography;  Surgeon: Minna Merritts, MD;  Location: [place] CV LAB;  Service: Cardiovascular;  Laterality: N/A;  . CARDIAC CATHETERIZATION N/A 11/12/2014   Procedure: Coronary Stent Intervention;  Surgeon: Isaias Cowman, MD;  Location: [place] CV LAB;  Service: Cardiovascular;  Laterality: N/A;  . CARDIAC CATHETERIZATION N/A 04/18/2015    Procedure: Left Heart Cath and Coronary Angiography;  Surgeon: Minna Merritts, MD;  Location: [place] CV LAB;  Service: Cardiovascular;  Laterality: N/A;  . CARDIAC CATHETERIZATION Left 06/04/2015   Procedure: Left Heart Cath and Coronary Angiography;  Surgeon: Wellington Hampshire, MD;  Location: [place] CV LAB;  Service: Cardiovascular;  Laterality: Left;  . CARDIAC CATHETERIZATION N/A 06/04/2015   Procedure: Coronary Stent Intervention;  Surgeon: Wellington Hampshire, MD;  Location: [place] CV LAB;  Service: Cardiovascular;  Laterality: N/A;  . CESAREAN SECTION  2001  . CHOLECYSTECTOMY N/A 11/18/2016   Procedure: LAPAROSCOPIC CHOLECYSTECTOMY WITH INTRAOPERATIVE CHOLANGIOGRAM;  Surgeon: Christene Lye, MD;  Location: [facility] ORS;  Service: General;  Laterality: N/A;  . COLONOSCOPY WITH PROPOFOL N/A 04/27/2016   Procedure: COLONOSCOPY WITH PROPOFOL;  Surgeon: [name], MD;  Location: [place];  Service: Endoscopy;  Laterality: N/A;  . COLONOSCOPY WITH PROPOFOL N/A 01/12/2018   Procedure: COLONOSCOPY WITH PROPOFOL;  Surgeon: Toledo, Benay Pike, MD;  Location: [facility] ENDOSCOPY;  Service: Endoscopy;  Laterality: N/A;  .  CORONARY ANGIOPLASTY    . CORONARY BALLOON ANGIOPLASTY N/A 06/29/2017   Procedure: CORONARY BALLOON ANGIOPLASTY;  Surgeon: Wellington Hampshire, MD;  Location: [place] CV LAB;  Service: Cardiovascular;  Laterality: N/A;  . CORONARY BALLOON ANGIOPLASTY N/A 09/20/2017   Procedure: CORONARY BALLOON ANGIOPLASTY;  Surgeon: Wellington Hampshire, MD;  Location: [place] CV LAB;  Service: Cardiovascular;  Laterality: N/A;  . DILATION AND CURETTAGE OF UTERUS    . ESOPHAGOGASTRODUODENOSCOPY (EGD) WITH PROPOFOL N/A 09/14/2014   Procedure: ESOPHAGOGASTRODUODENOSCOPY (EGD) WITH PROPOFOL;  Surgeon: Josefine Class, MD;  [facility];  Service: Endoscopy;  Laterality: N/A;  . ESOPHAGOGASTRODUODENOSCOPY (EGD) WITH PROPOFOL N/A 04/27/2016   Procedure:  ESOPHAGOGASTRODUODENOSCOPY (EGD) WITH PROPOFOL;  Surgeon: [name], MD;  Location: [place];  Service: Endoscopy;  Laterality: N/A;  Diabetic - oral meds  . ESOPHAGOGASTRODUODENOSCOPY (EGD) WITH PROPOFOL N/A 01/12/2018   Procedure: ESOPHAGOGASTRODUODENOSCOPY (EGD) WITH PROPOFOL;  Surgeon: [place], Benay Pike, MD;  Location: [facility] ENDOSCOPY;  Service: Endoscopy;  Laterality: N/A;  . ESOPHAGOGASTRODUODENOSCOPY (EGD) WITH PROPOFOL N/A 04/11/2019   Procedure: ESOPHAGOGASTRODUODENOSCOPY (EGD) WITH PROPOFOL;  Surgeon: [name], MD;  Location: [facility] ENDOSCOPY;  Service: Endoscopy;  Laterality: N/A;  . GASTRIC BYPASS  09/2009   [place] (IM) NAIL INTERTROCHANTERIC Left 10/13/2018   Procedure: INTRAMEDULLARY (IM) NAIL INTERTROCHANTRIC;  Surgeon: Leandrew Koyanagi, MD;  Location: [place];  Service: Orthopedics;  Laterality: Left;  . Left Carotid to sublcavian artery bypass w/ subclavian artery ligation     a. Performed @ [place].  . LEFT HEART CATH AND CORONARY ANGIOGRAPHY Left 06/29/2017   Procedure: LEFT HEART CATH AND CORONARY ANGIOGRAPHY;  Surgeon: Wellington Hampshire, MD;  Location: [place] CV LAB;  Service: Cardiovascular;  Laterality: Left;  . LEFT HEART CATH AND CORONARY ANGIOGRAPHY N/A 09/20/2017   Procedure: LEFT HEART CATH AND CORONARY ANGIOGRAPHY;  Surgeon: Wellington Hampshire, MD;  Location: [place] CV LAB;  Service: Cardiovascular;  Laterality: N/A;  . LEFT HEART CATH AND CORONARY ANGIOGRAPHY N/A 12/20/2017   Procedure: LEFT HEART CATH AND CORONARY ANGIOGRAPHY;  Surgeon: Wellington Hampshire, MD;  Location: [place] CV LAB;  Service: Cardiovascular;  Laterality: N/A;  . LEFT HEART CATH AND CORONARY ANGIOGRAPHY N/A 04/20/2019   Procedure: LEFT HEART CATH AND CORONARY ANGIOGRAPHY possible PCI;  Surgeon: Minna Merritts, MD;  Location: [place] CV LAB;  Service: Cardiovascular;  Laterality: N/A;  . MELANOMA EXCISION  2016   Dr. Evorn Gong  .  [place]  2002  . RIGHT OOPHORECTOMY    . SHOULDER ARTHROSCOPY WITH OPEN ROTATOR CUFF REPAIR Right 01/07/2016   Procedure: SHOULDER ARTHROSCOPY WITH DEBRIDMENT, SUBACHROMIAL DECOMPRESSION;  Surgeon: Corky Mull, MD;  Location: [facility];  Service: Orthopedics;  Laterality: Right;  . SHOULDER ARTHROSCOPY WITH OPEN ROTATOR CUFF REPAIR Right 03/16/2017   Procedure: SHOULDER ARTHROSCOPY WITH OPEN ROTATOR CUFF REPAIR POSSIBLE BICEPS TENODESIS;  Surgeon: Corky Mull, MD;  Location: [facility] OR[facility];  Service: Orthopedics;  Laterality: Right;  . TRIGGER FINGER RELEASE Right     [place]    Prior to Admission medications   Medication Sig Start Date End Date Taking? Authorizing Provider  ALPRAZolam Duanne Moron) 1 MG tablet Take 1 tablet by mouth twice daily as needed Patient taking differently: Take 1 mg by mouth 2 (two) times daily as needed for anxiety.  08/14/19  Yes [name], MD  amLODipine (NORVASC) 10 MG tablet Take 0.5 tablets (5 mg total) by mouth  daily. 08/22/19  Yes Minna Merritts, MD  aspirin 81 MG EC tablet Take 81 mg by mouth daily.    Yes [provider]  buPROPion (WELLBUTRIN XL) 300 MG 24 hr tablet Take 1 tablet (300 mg total) by mouth daily. 03/15/19  Yes [name], MD  celecoxib (CELEBREX) 200 MG capsule Take 200 mg by mouth 2 (two) times daily as needed for mild pain or moderate pain.  08/10/19  Yes [provider]  Cholecalciferol 50 MCG (2000 UT) TABS Take 2,000 Units by mouth daily.   Yes [provider]  diphenoxylate-atropine (LOMOTIL) 2.5-0.025 MG tablet TAKE 1 TABLET BY MOUTH 1 TO 4 TIMES DAILY FOR DIARRHEA. Patient taking differently: Take 1 tablet by mouth 4 (four) times daily as needed for diarrhea or loose stools.  08/10/19  Yes Fisher, Kirstie Peri, MD  EUTHYROX 25 MCG tablet TAKE 1 TABLET BY MOUTH ONCE DAILY BEFORE BREAKFAST Patient taking differently: Take 25 mcg by mouth daily before breakfast.  08/01/19  Yes [name], MD    gabapentin (NEURONTIN) 300 MG capsule Take 1 capsule (300 mg total) by mouth 2 (two) times daily. 10/12/18  Yes [name], MD  isosorbide mononitrate (IMDUR) 60 MG 24 hr tablet Take 1 tablet (60 mg total) by mouth daily. 05/05/19  Yes Gollan, Kathlene November, MD  losartan (COZAAR) 25 MG tablet Take 25 mg by mouth daily. 04/05/19  Yes [provider]  Multiple Vitamin (MULTI-VITAMIN) tablet Take 1 tablet by mouth daily.    Yes [provider]  nitrofurantoin, macrocrystal-monohydrate, (MACROBID) 100 MG capsule Take 1 capsule (100 mg total) by mouth 2 (two) times daily for 7 days. 08/28/19 09/04/19 Yes [name], MD  nitroGLYCERIN (NITROSTAT) 0.4 MG SL tablet Place 1 tablet (0.4 mg total) under the tongue every 5 (five) minutes as needed for chest pain. 04/24/19  Yes Gollan, Kathlene November, MD  pantoprazole (PROTONIX) 40 MG tablet Take 1 tablet by mouth twice daily Patient taking differently: Take 40 mg by mouth 2 (two) times daily.  06/06/19  Yes [name], MD  promethazine (PHENERGAN) 25 MG tablet Take 25 mg by mouth every 6 (six) hours as needed for nausea or vomiting.  07/25/19  Yes [provider]  propranolol ER (INDERAL LA) 60 MG 24 hr capsule Take 60 mg by mouth at bedtime. 06/05/19  Yes [provider]  traZODone (DESYREL) 100 MG tablet Take 100-200 mg by mouth at bedtime as needed for sleep.  03/06/19  Yes [provider]  venlafaxine XR (EFFEXOR-XR) 75 MG 24 hr capsule Take 2 capsules (150 mg total) by mouth daily with breakfast. 08/22/19  Yes [name], MD    Allergies Lipitor [atorvastatin] and Tramadol  Family History  Problem Relation Age of Onset  . Hypertension Mother   . Anxiety disorder Mother   . Depression Mother   . Bipolar disorder Mother   . Heart disease Mother        No details  . Hyperlipidemia Mother   . Kidney disease Father   . Heart disease Father 75  . Hypertension Father   . Diabetes Father   . Stroke Father    . Colon cancer Father        dx in his 33's  . Anxiety disorder Father   . Depression Father   . Skin cancer Father   . Kidney disease Sister   . Thyroid nodules Sister   . Hypertension Sister   .  Hypertension Sister   . Diabetes Sister   . Hyperlipidemia Sister   . Depression Sister   . Breast cancer Maternal Aunt 23  . Breast cancer Maternal Aunt 70  . Ovarian cancer Cousin   . Colon cancer Cousin   . Kidney cancer Cousin   . Breast cancer Other   . Bladder Cancer Neg Hx     Social History Social History   Tobacco Use  . Smoking status: Former Smoker    Types: Cigarettes    Quit date: 08/31/1994    Years since quitting: 25.0  . Smokeless tobacco: Never Used  . Tobacco comment: quit 28 years ago  Vaping Use  . Vaping Use: Never used  Substance Use Topics  . Alcohol use: No    Alcohol/week: 0.0 standard drinks  . Drug use: No    Review of Systems Constitutional: No fever/chills Eyes: No visual changes. ENT: No sore throat. Cardiovascular: Occasional intermittent slight chest pains over the left chest off and on for a few weeks.  Seems to come and go randomly. Respiratory: Denies shortness of breath. Gastrointestinal: No abdominal pain.   Genitourinary: Negative for dysuria. Musculoskeletal: Negative for back pain. Skin: Negative for rash.  Will occasionally get very sweaty sugars well Neurological: Negative for headaches, areas of focal weakness or numbness.  Gets lightheaded Sunday felt like she was about to pass out and become unresponsive before she was able to take sugar    ____________________________________________   PHYSICAL EXAM:  VITAL SIGNS: ED Triage Vitals [09/01/19 0809]  Enc Vitals Group     BP (!) 100/63     Pulse Rate 60     Resp 18     Temp 98 F (36.7 C)     Temp Source Oral     SpO2 98 %     Weight 145 lb (65.8 kg)     Height _0  (1.575 m)     Head Circumference      Peak Flow      Pain Score 0     Pain Loc      Pain  Edu?      Excl. in [place]?     Constitutional: Alert and oriented. Well appearing and in no acute distress. Eyes: Conjunctivae are normal. Head: Atraumatic. Nose: No congestion/rhinnorhea. Mouth/Throat: Mucous membranes are moist. Neck: No stridor.  Cardiovascular: Normal rate, regular rhythm. Grossly normal heart sounds.  Good peripheral circulation. Respiratory: Normal respiratory effort.  No retractions. Lungs CTAB. Gastrointestinal: Soft and nontender. No distention. Musculoskeletal: No lower extremity tenderness nor edema. Neurologic:  Normal speech and language. No gross focal neurologic deficits are appreciated.  Skin:  Skin is warm, dry and intact. No rash noted. Psychiatric: Mood and affect are normal. Speech and behavior are normal.  Just prior to seeing the patient she was given D50 for low glucose.  She became diaphoretic lightheaded and dizzy while in the waiting area and her blood sugar was found to be less than 30.  This is despite her having drank juice and eating crackers throughout her weight in the ED ____________________________________________   LABS (all labs ordered are listed, but only abnormal results are displayed)  Labs Reviewed  BASIC METABOLIC PANEL - Abnormal; Notable for the following components:      Result Value   Chloride 114 (*)    CO2 18 (*)    Glucose, Bld 65 (*)    BUN 30 (*)    Creatinine, Ser 1.34 (*)    Calcium  8.2 (*)    GFR calc non Af Amer 46 (*)    GFR calc Af Amer 53 (*)    All other components within normal limits  CBC - Abnormal; Notable for the following components:   WBC 12.5 (*)    All other components within normal limits  GLUCOSE, CAPILLARY - Abnormal; Notable for the following components:   Glucose-Capillary 48 (*)    All other components within normal limits  GLUCOSE, CAPILLARY - Abnormal; Notable for the following components:   Glucose-Capillary 29 (*)    All other components within normal limits  GLUCOSE, CAPILLARY -  Abnormal; Notable for the following components:   Glucose-Capillary 153 (*)    All other components within normal limits  SARS CORONAVIRUS 2 BY RT PCR (HOSPITAL ORDER, [place] LAB)  GLUCOSE, CAPILLARY  CBG MONITORING, ED  TROPONIN I (HIGH SENSITIVITY)  TROPONIN I (HIGH SENSITIVITY)  TROPONIN I (HIGH SENSITIVITY)   ____________________________________________  EKG  Reviewed interpreted 8 Heart rate 69 QRS 99 QTc 420 Normal sinus rhythm, probable left ventricular hypertrophy.  No obvious ischemic abnormalities noted ____________________________________________  RADIOLOGY  DG Chest 2 View  Result Date: 09/01/2019 CLINICAL DATA:  Chest pain EXAM: CHEST - 2 VIEW COMPARISON:  April 18, 2019 FINDINGS: Lungs are clear. Heart size and pulmonary vascularity are normal. Right-sided aortic arch noted. Postoperative change in the upper left paratracheal region noted. No adenopathy. No bone lesions. IMPRESSION: Right-sided aortic arch.  Heart size normal.  Lungs clear. Electronically Signed   By: Lowella Grip III M.D.   On: 09/01/2019 08:45     Chest x-ray reviewed, right-sided aortic arch ____________________________________________   PROCEDURES  Procedure(s) performed: None  Procedures  Critical Care performed: No  ____________________________________________   INITIAL IMPRESSION / ASSESSMENT AND PLAN / ED COURSE  Pertinent labs & imaging results that were available during my care of the patient were reviewed by me and considered in my medical decision making (see chart for details).   Vague chest pain for a couple weeks time, seems atypical in nature.  Troponins reassuring and EKG reassuring.  She tells me her primary concern is episodes of unexpected low blood sugars.  Differential diagnosis for this is somewhat broad and complex, she is not on any diabetes medications now for at least a month.  She does have a history of gastric bypass which may be  contributory as well.  She has had 2 episodes now in the last week at least where she felt as though she is going to become unresponsive due to the hypoglycemia, and while in the ER also demonstrated severe hypoglycemia requiring D50.    Admission discussed with Dr. Posey Pronto, will admit for further work-up primarily due to concerns of significant hypoglycemia occurring external to taking diabetes medications. May be related to her previous gastric bypass, but etiology is not clear and she has had 2 symptomatic episodes of hypoglycemia including this morning and once in the ER. Continue to monitor her glucose carefully. Thus far her chest pain evaluation quite reassuring, and she tells me she has not had any chest pain since yesterday and her primary cause for coming was due to episodes of low blood sugar persisting and seemingly getting worse.  ____________________________________________   FINAL CLINICAL IMPRESSION(S) / ED DIAGNOSES  Final diagnoses:  Chest pain, moderate coronary artery risk  Hypoglycemia        Note:  This document was prepared using Dragon voice recognition software and may include unintentional  dictation errors       Delman Kitten, MD 09/02/19 [phone]

## 2019-09-01 NOTE — ED Notes (Addendum)
Screener asked this RN if ok for pt to eat, informed ok to give food. Eating crackers

## 2019-09-01 NOTE — ED Notes (Signed)
Pt c/o chest pain radiating to neck.  Repeat troponin WNL.  Offered to do repeat EKG, pt refusing at this time stating "it will be normal".  Son brought bag in for pt, given to pt by screener.

## 2019-09-01 NOTE — ED Notes (Signed)
Admitting provider to bedside. Admitting provider discussed the dangers of leaving without further treatment with patient. Patient verbalized understanding of information discussed. Patient agreed to sign out AMA.

## 2019-09-01 NOTE — ED Notes (Signed)
Given coke

## 2019-09-01 NOTE — ED Notes (Signed)
Patient called out. RN to bedside. Patient requesting to leave. RN explained that patient would be leaving AMA. RN explained the dangers of doing so. Patient verbalized understanding of information discussed. Patient indicated that she still wanted to leave AMA. RN asked patient if she would be willing to wait and speak to admitting provider. Admitting provider paged.

## 2019-09-01 NOTE — ED Triage Notes (Signed)
Presents via EMS from  Home with a 2 day hx of intermittent chest pain

## 2019-09-01 NOTE — ED Notes (Signed)
Pt given orange juice. AOx4 at this time

## 2019-09-02 ENCOUNTER — Other Ambulatory Visit: Payer: Self-pay

## 2019-09-02 ENCOUNTER — Inpatient Hospital Stay
Admission: EM | Admit: 2019-09-02 | Discharge: 2019-09-12 | DRG: 638 | Disposition: A | Discharge status: Home Health | Payer: Medicare Other | Attending: Internal Medicine | Admitting: Internal Medicine

## 2019-09-02 DIAGNOSIS — Z8349 Family history of other endocrine, nutritional and metabolic diseases: Secondary | ICD-10-CM

## 2019-09-02 DIAGNOSIS — F418 Other specified anxiety disorders: Secondary | ICD-10-CM | POA: Diagnosis not present

## 2019-09-02 DIAGNOSIS — Z8589 Personal history of malignant neoplasm of other organs and systems: Secondary | ICD-10-CM

## 2019-09-02 DIAGNOSIS — E876 Hypokalemia: Secondary | ICD-10-CM | POA: Diagnosis not present

## 2019-09-02 DIAGNOSIS — Z95820 Peripheral vascular angioplasty status with implants and grafts: Secondary | ICD-10-CM

## 2019-09-02 DIAGNOSIS — Z8719 Personal history of other diseases of the digestive system: Secondary | ICD-10-CM

## 2019-09-02 DIAGNOSIS — K219 Gastro-esophageal reflux disease without esophagitis: Secondary | ICD-10-CM | POA: Diagnosis present

## 2019-09-02 DIAGNOSIS — I25118 Atherosclerotic heart disease of native coronary artery with other forms of angina pectoris: Secondary | ICD-10-CM

## 2019-09-02 DIAGNOSIS — Z7982 Long term (current) use of aspirin: Secondary | ICD-10-CM

## 2019-09-02 DIAGNOSIS — E1142 Type 2 diabetes mellitus with diabetic polyneuropathy: Secondary | ICD-10-CM | POA: Diagnosis present

## 2019-09-02 DIAGNOSIS — Z888 Allergy status to other drugs, medicaments and biological substances status: Secondary | ICD-10-CM

## 2019-09-02 DIAGNOSIS — E46 Unspecified protein-calorie malnutrition: Secondary | ICD-10-CM | POA: Diagnosis present

## 2019-09-02 DIAGNOSIS — N179 Acute kidney failure, unspecified: Secondary | ICD-10-CM | POA: Diagnosis present

## 2019-09-02 DIAGNOSIS — Z79899 Other long term (current) drug therapy: Secondary | ICD-10-CM

## 2019-09-02 DIAGNOSIS — D509 Iron deficiency anemia, unspecified: Secondary | ICD-10-CM | POA: Diagnosis present

## 2019-09-02 DIAGNOSIS — I639 Cerebral infarction, unspecified: Secondary | ICD-10-CM | POA: Diagnosis present

## 2019-09-02 DIAGNOSIS — Z791 Long term (current) use of non-steroidal anti-inflammatories (NSAID): Secondary | ICD-10-CM

## 2019-09-02 DIAGNOSIS — I2510 Atherosclerotic heart disease of native coronary artery without angina pectoris: Secondary | ICD-10-CM | POA: Diagnosis present

## 2019-09-02 DIAGNOSIS — Z9884 Bariatric surgery status: Secondary | ICD-10-CM

## 2019-09-02 DIAGNOSIS — N1831 Chronic kidney disease, stage 3a: Secondary | ICD-10-CM | POA: Diagnosis present

## 2019-09-02 DIAGNOSIS — E162 Hypoglycemia, unspecified: Secondary | ICD-10-CM | POA: Diagnosis not present

## 2019-09-02 DIAGNOSIS — R251 Tremor, unspecified: Secondary | ICD-10-CM | POA: Diagnosis present

## 2019-09-02 DIAGNOSIS — I10 Essential (primary) hypertension: Secondary | ICD-10-CM | POA: Diagnosis present

## 2019-09-02 DIAGNOSIS — I6329 Cerebral infarction due to unspecified occlusion or stenosis of other precerebral arteries: Secondary | ICD-10-CM

## 2019-09-02 DIAGNOSIS — G35 Multiple sclerosis: Secondary | ICD-10-CM | POA: Diagnosis present

## 2019-09-02 DIAGNOSIS — E785 Hyperlipidemia, unspecified: Secondary | ICD-10-CM | POA: Diagnosis present

## 2019-09-02 DIAGNOSIS — E039 Hypothyroidism, unspecified: Secondary | ICD-10-CM | POA: Diagnosis present

## 2019-09-02 DIAGNOSIS — R0602 Shortness of breath: Secondary | ICD-10-CM

## 2019-09-02 DIAGNOSIS — N390 Urinary tract infection, site not specified: Secondary | ICD-10-CM | POA: Diagnosis not present

## 2019-09-02 DIAGNOSIS — Z85820 Personal history of malignant melanoma of skin: Secondary | ICD-10-CM

## 2019-09-02 DIAGNOSIS — I255 Ischemic cardiomyopathy: Secondary | ICD-10-CM | POA: Diagnosis present

## 2019-09-02 DIAGNOSIS — I252 Old myocardial infarction: Secondary | ICD-10-CM

## 2019-09-02 DIAGNOSIS — E11649 Type 2 diabetes mellitus with hypoglycemia without coma: Principal | ICD-10-CM | POA: Diagnosis present

## 2019-09-02 DIAGNOSIS — R42 Dizziness and giddiness: Secondary | ICD-10-CM | POA: Diagnosis present

## 2019-09-02 DIAGNOSIS — F0390 Unspecified dementia without behavioral disturbance: Secondary | ICD-10-CM | POA: Diagnosis present

## 2019-09-02 DIAGNOSIS — I25110 Atherosclerotic heart disease of native coronary artery with unstable angina pectoris: Secondary | ICD-10-CM | POA: Diagnosis present

## 2019-09-02 DIAGNOSIS — Z87891 Personal history of nicotine dependence: Secondary | ICD-10-CM

## 2019-09-02 DIAGNOSIS — Z9049 Acquired absence of other specified parts of digestive tract: Secondary | ICD-10-CM

## 2019-09-02 DIAGNOSIS — K08109 Complete loss of teeth, unspecified cause, unspecified class: Secondary | ICD-10-CM | POA: Diagnosis present

## 2019-09-02 DIAGNOSIS — R1310 Dysphagia, unspecified: Secondary | ICD-10-CM | POA: Diagnosis present

## 2019-09-02 DIAGNOSIS — R001 Bradycardia, unspecified: Secondary | ICD-10-CM | POA: Diagnosis present

## 2019-09-02 DIAGNOSIS — Z79890 Hormone replacement therapy (postmenopausal): Secondary | ICD-10-CM

## 2019-09-02 DIAGNOSIS — Z8249 Family history of ischemic heart disease and other diseases of the circulatory system: Secondary | ICD-10-CM

## 2019-09-02 DIAGNOSIS — E1122 Type 2 diabetes mellitus with diabetic chronic kidney disease: Secondary | ICD-10-CM | POA: Diagnosis present

## 2019-09-02 DIAGNOSIS — Z20822 Contact with and (suspected) exposure to covid-19: Secondary | ICD-10-CM | POA: Diagnosis present

## 2019-09-02 DIAGNOSIS — Z8673 Personal history of transient ischemic attack (TIA), and cerebral infarction without residual deficits: Secondary | ICD-10-CM

## 2019-09-02 DIAGNOSIS — Z90721 Acquired absence of ovaries, unilateral: Secondary | ICD-10-CM

## 2019-09-02 DIAGNOSIS — Z885 Allergy status to narcotic agent status: Secondary | ICD-10-CM

## 2019-09-02 DIAGNOSIS — Z955 Presence of coronary angioplasty implant and graft: Secondary | ICD-10-CM

## 2019-09-02 DIAGNOSIS — I130 Hypertensive heart and chronic kidney disease with heart failure and stage 1 through stage 4 chronic kidney disease, or unspecified chronic kidney disease: Secondary | ICD-10-CM | POA: Diagnosis present

## 2019-09-02 DIAGNOSIS — I25119 Atherosclerotic heart disease of native coronary artery with unspecified angina pectoris: Secondary | ICD-10-CM | POA: Diagnosis present

## 2019-09-02 DIAGNOSIS — E1151 Type 2 diabetes mellitus with diabetic peripheral angiopathy without gangrene: Secondary | ICD-10-CM | POA: Diagnosis present

## 2019-09-02 DIAGNOSIS — I5032 Chronic diastolic (congestive) heart failure: Secondary | ICD-10-CM | POA: Diagnosis present

## 2019-09-02 LAB — URINALYSIS, COMPLETE (UACMP) WITH MICROSCOPIC
Bilirubin Urine: NEGATIVE
Glucose, UA: NEGATIVE mg/dL
Hgb urine dipstick: NEGATIVE
Ketones, ur: NEGATIVE mg/dL
Nitrite: NEGATIVE
Protein, ur: NEGATIVE mg/dL
Specific Gravity, Urine: 1.009 (ref 1.005–1.030)
WBC, UA: >50 WBC/hpf — ABNORMAL HIGH (ref 0–5)
pH: 5 (ref 5.0–8.0)

## 2019-09-02 LAB — GLUCOSE, CAPILLARY
Glucose-Capillary: 54 mg/dL — ABNORMAL LOW (ref 70–99)
Glucose-Capillary: 30 mg/dL — CL (ref 70–99)
Glucose-Capillary: 85 mg/dL (ref 70–99)
Glucose-Capillary: 61 mg/dL — ABNORMAL LOW (ref 70–99)
Glucose-Capillary: 30 mg/dL — CL (ref 70–99)
Glucose-Capillary: 98 mg/dL (ref 70–99)

## 2019-09-02 LAB — BASIC METABOLIC PANEL
Anion gap: 7 (ref 5–15)
BUN: 22 mg/dL — ABNORMAL HIGH (ref 6–20)
CO2: 24 mmol/L (ref 22–32)
Calcium: 8.5 mg/dL — ABNORMAL LOW (ref 8.9–10.3)
Chloride: 111 mmol/L (ref 98–111)
Creatinine, Ser: 1.39 mg/dL — ABNORMAL HIGH (ref 0.44–1.00)
GFR calc Af Amer: 51 mL/min — ABNORMAL LOW (ref >60)
GFR calc non Af Amer: 44 mL/min — ABNORMAL LOW (ref >60)
Glucose, Bld: 62 mg/dL — ABNORMAL LOW (ref 70–99)
Potassium: 3.9 mmol/L (ref 3.5–5.1)
Sodium: 142 mmol/L (ref 135–145)

## 2019-09-02 LAB — CBC
HCT: 41.2 % (ref 36.0–46.0)
Hemoglobin: 13.7 g/dL (ref 12.0–15.0)
MCH: 27.8 pg (ref 26.0–34.0)
MCHC: 33.3 g/dL (ref 30.0–36.0)
MCV: 83.7 fL (ref 80.0–100.0)
Platelets: 234 10*3/uL (ref 150–400)
RBC: 4.92 MIL/uL (ref 3.87–5.11)
RDW: 14.1 % (ref 11.5–15.5)
WBC: 12.4 10*3/uL — ABNORMAL HIGH (ref 4.0–10.5)
nRBC: 0 % (ref 0.0–0.2)

## 2019-09-02 MED ORDER — DEXTROSE 10 % IV SOLN: INTRAVENOUS | Status: DC

## 2019-09-02 MED ORDER — SODIUM CHLORIDE 0.9 % IV SOLN
1.0000 g | Freq: Once | INTRAVENOUS | Status: AC
  Administered 2019-09-02: 1 g via INTRAVENOUS
  Filled 2019-09-02: qty 10

## 2019-09-02 MED ORDER — SODIUM CHLORIDE 0.9% FLUSH: 3.0000 mL | Freq: Once | INTRAVENOUS | Status: DC

## 2019-09-02 MED ORDER — DEXTROSE 50 % IV SOLN
INTRAVENOUS | Status: AC
  Administered 2019-09-02: 50 mL via INTRAVENOUS
  Filled 2019-09-02: qty 50

## 2019-09-02 MED ORDER — DEXTROSE 50 % IV SOLN: 1.0000 | Freq: Once | INTRAVENOUS | Status: DC

## 2019-09-02 MED ORDER — DEXTROSE 50 % IV SOLN: 1.0000 | Freq: Once | INTRAVENOUS | Status: AC

## 2019-09-02 MED ORDER — DEXTROSE-NACL 5-0.45 % IV SOLN: INTRAVENOUS | Status: DC

## 2019-09-02 MED ORDER — DEXTROSE 50 % IV SOLN
1.0000 | Freq: Once | INTRAVENOUS | Status: AC
  Administered 2019-09-02: 50 mL via INTRAVENOUS

## 2019-09-02 MED ORDER — DEXTROSE 10 % IV SOLN: INTRAVENOUS | Status: AC

## 2019-09-02 NOTE — ED Triage Notes (Signed)
Pt presents to ED via [facility], was seen yesterday. Per EMS pt initial CBG 52, pt ate sugar pill prior to EMS arrival. Per EMS pt recheck CBG 175  200/98 56HR 99% RA  20G to L [facility]

## 2019-09-02 NOTE — ED Triage Notes (Signed)
Pt arrives via [facility] for reports of low blood sugar. Pt reports sugar has been dropping frequently and she has to eat sugar pills to get it to come up. Pt seen yesterday for the same and was to be admitted but she decided to leave. Pt in NAD, skin warm and dry.

## 2019-09-02 NOTE — ED Notes (Addendum)
Pt CBG 30. Pt given amp D50 and prompted to drink pepsi. Pt A&O, NAD noted.   Admitting MD notified. New orders placed.

## 2019-09-02 NOTE — ED Notes (Signed)
Transport request put in by ED secretary.

## 2019-09-02 NOTE — H&P (Signed)
History and Physical    Beda Dula RFF:[id number] DOB: 08-15-1970 DOA: 09/02/2019  PCP: [name], MD   Patient coming from: Home   Chief Complaint: Low sugar   HPI: [name] is a 49 y.o. female with medical history significant for history of type 2 diabetes mellitus no longer on medications for this, history of gastric bypass, depression and anxiety, coronary artery disease, history of CVA, and hypothyroidism, now presenting to the emergency department with recurrent hypoglycemia.  Patient reports suffering from pain with swallowing for several weeks now, has discomfort swallowing both liquids and solids but denies any choking, cough, or shortness of breath.  She denies any fevers or chills.  Reports that the odynophagia had been evaluated by GI with upper endoscopy that she reports to have been unremarkable.  Patient denies using alcohol or any diabetes medications recently.  She was seen in the emergency department yesterday with chest discomfort, had normal at bedtime troponin x3, nonischemic EKG, and no acute findings on chest x-ray.  She was admitted to the hospital yesterday but left AMA.  She reports leaving yesterday because seem to be taking too long for her to get to a room, expresses regret for leaving.  She has not had any chest discomfort since then.  She denies any dysuria, suprapubic or flank pain, hematuria, or fevers.  ED Course: Upon arrival to the ED, patient is found to be afebrile, saturating well on room air, slightly bradycardic, and with blood pressure 190/76.  EKG features a normal sinus rhythm.  Chemistry panel notable for glucose of 62 creatinine 1.39, similar to priors.  CBC with leukocytosis to 12,400.  Urine was sent for culture and she was given a dose of Rocephin for bacteriuria, and also treated with IV dextrose, but continues to have hypoglycemia despite this.    Review of Systems:  All other systems reviewed and apart from HPI, are  negative.  Past Medical History:  Diagnosis Date  . Acute colitis 01/27/2017  . Acute pyelonephritis   . Anemia    iron deficiency anemia  . Aortic arch aneurysm ([place])   . BRCA negative 2014  . CAD (coronary artery disease)    a. 08/2003 Cath: LAD 30-40-med Rx; b. 11/2014 PCI: LAD 85m(3.25x23 Xience Alpine DES); c. 06/2015 PCI: D1 (2.25x12 Resolute Integrity DES); d. 06/2017 PCI: Patent mLAD stent, D2 95 (PTCA); e. 09/2017 PCI: D2 99ost (CBA); d. 12/2017 Cath: LM nl, LAD 3523m80d (small), D1 40ost, D2 95ost, LCX 40p, RCA 40ost/p->Med rx for D2 given restenosis.  . Colitis 06/03/2015  . Colon polyp   . CVA (cerebral vascular accident) ([place]   Left side weakness.   . Degenerative tear of glenoid labrum of right shoulder 03/15/2017  . Diabetes mellitus without complication ([facility]  . Family history of breast cancer    BRCA neg 2014  . Femur fracture, left ([facility]9/10/2018  . Gastric ulcer 04/27/2011  . History of echocardiogram    a. 03/2017 Echo: EF 60-65%, no rwma; b. 02/2018 Echo: EF 60-65%, no rwma. Nl RV fxn. No cardiac source of emboli (admitted w/ stroke).  . Hypertension   . Malignant melanoma of skin of scalp ([facility] [place]  . MI, acute, non ST segment elevation ([facility]  . Neuromuscular disorder ([facility]  . S/P drug eluting coronary stent placement 06/04/2015  . Sepsis ([facility]2/14/2019  . [facility]   a. 02/2018 MRI: 23m83mate acute/early subacute L medial  frontal lobe inarct; b. 02/2018 MRA No large vessel occlusion or aneurysm. Mod to sev L P2 stenosis. thready L vertebral artery, diffusely dzs'd; c. 02/2018 Carotid U/S: <50% bilat ICA dzs.    Past Surgical History:  Procedure Laterality Date  . APPENDECTOMY    . CARDIAC CATHETERIZATION N/A 11/09/2014   Procedure: Coronary Angiography;  Surgeon: Minna Merritts, MD;  Location: [place] [facility];  Service: Cardiovascular;  Laterality: N/A;  . CARDIAC CATHETERIZATION N/A 11/12/2014   Procedure: Coronary Stent Intervention;  Surgeon:  Isaias Cowman, MD;  Location: [place] [facility];  Service: Cardiovascular;  Laterality: N/A;  . CARDIAC CATHETERIZATION N/A 04/18/2015   Procedure: Left Heart Cath and Coronary Angiography;  Surgeon: Minna Merritts, MD;  Location: [place] [facility];  Service: Cardiovascular;  Laterality: N/A;  . CARDIAC CATHETERIZATION Left 06/04/2015   Procedure: Left Heart Cath and Coronary Angiography;  Surgeon: Wellington Hampshire, MD;  Location: [place] [facility];  Service: Cardiovascular;  Laterality: Left;  . CARDIAC CATHETERIZATION N/A 06/04/2015   Procedure: Coronary Stent Intervention;  Surgeon: Wellington Hampshire, MD;  Location: [place] [facility];  Service: Cardiovascular;  Laterality: N/A;  . CESAREAN SECTION  2001  . CHOLECYSTECTOMY N/A 11/18/2016   Procedure: LAPAROSCOPIC CHOLECYSTECTOMY WITH INTRAOPERATIVE CHOLANGIOGRAM;  Surgeon: Christene Lye, MD;  Location: [facility];  Service: General;  Laterality: N/A;  . COLONOSCOPY WITH PROPOFOL N/A 04/27/2016   Procedure: COLONOSCOPY WITH PROPOFOL;  Surgeon: [name], MD;  Location: [place];  Service: Endoscopy;  Laterality: N/A;  . COLONOSCOPY WITH PROPOFOL N/A 01/12/2018   Procedure: COLONOSCOPY WITH PROPOFOL;  Surgeon: [place], Benay Pike, MD;  Location: [facility];  Service: Endoscopy;  Laterality: N/A;  . CORONARY ANGIOPLASTY    . CORONARY BALLOON ANGIOPLASTY N/A 06/29/2017   Procedure: CORONARY BALLOON ANGIOPLASTY;  Surgeon: Wellington Hampshire, MD;  Location: [place] [facility];  Service: Cardiovascular;  Laterality: N/A;  . CORONARY BALLOON ANGIOPLASTY N/A 09/20/2017   Procedure: CORONARY BALLOON ANGIOPLASTY;  Surgeon: Wellington Hampshire, MD;  Location: [place] [facility];  Service: Cardiovascular;  Laterality: N/A;  . DILATION AND CURETTAGE OF UTERUS    . ESOPHAGOGASTRODUODENOSCOPY (EGD) WITH PROPOFOL N/A 09/14/2014   Procedure: ESOPHAGOGASTRODUODENOSCOPY (EGD) WITH PROPOFOL;  Surgeon: Josefine Class, MD;   Location: [facility];  Service: Endoscopy;  Laterality: N/A;  . ESOPHAGOGASTRODUODENOSCOPY (EGD) WITH PROPOFOL N/A 04/27/2016   Procedure: ESOPHAGOGASTRODUODENOSCOPY (EGD) WITH PROPOFOL;  Surgeon: [name], MD;  Location: [place];  Service: Endoscopy;  Laterality: N/A;  Diabetic - oral meds  . ESOPHAGOGASTRODUODENOSCOPY (EGD) WITH PROPOFOL N/A 01/12/2018   Procedure: ESOPHAGOGASTRODUODENOSCOPY (EGD) WITH PROPOFOL;  Surgeon: [place], Benay Pike, MD;  Location: [facility];  Service: Endoscopy;  Laterality: N/A;  . ESOPHAGOGASTRODUODENOSCOPY (EGD) WITH PROPOFOL N/A 04/11/2019   Procedure: ESOPHAGOGASTRODUODENOSCOPY (EGD) WITH PROPOFOL;  Surgeon: [name], MD;  Location: [facility];  Service: Endoscopy;  Laterality: N/A;  . GASTRIC BYPASS  09/2009   [place] (IM) NAIL INTERTROCHANTERIC Left 10/13/2018   Procedure: INTRAMEDULLARY (IM) NAIL INTERTROCHANTRIC;  Surgeon: Leandrew Koyanagi, MD;  Location: [place];  Service: Orthopedics;  Laterality: Left;  . Left Carotid to sublcavian artery bypass w/ subclavian artery ligation     a. Performed @ [facility].  . LEFT HEART CATH AND CORONARY ANGIOGRAPHY Left 06/29/2017   Procedure: LEFT HEART CATH AND CORONARY ANGIOGRAPHY;  Surgeon: Wellington Hampshire, MD;  Location: [place] [facility];  Service: Cardiovascular;  Laterality: Left;  . LEFT HEART CATH AND CORONARY ANGIOGRAPHY N/A 09/20/2017   Procedure: LEFT HEART CATH AND CORONARY ANGIOGRAPHY;  Surgeon: Wellington Hampshire, MD;  Location: [place] [facility];  Service: Cardiovascular;  Laterality: N/A;  . LEFT HEART CATH AND CORONARY ANGIOGRAPHY N/A 12/20/2017   Procedure: LEFT HEART CATH AND CORONARY ANGIOGRAPHY;  Surgeon: Wellington Hampshire, MD;  Location: [place] [facility];  Service: Cardiovascular;  Laterality: N/A;  . LEFT HEART CATH AND CORONARY ANGIOGRAPHY N/A 04/20/2019   Procedure: LEFT HEART CATH AND CORONARY ANGIOGRAPHY possible PCI;  Surgeon:  Minna Merritts, MD;  Location: [place] [facility];  Service: Cardiovascular;  Laterality: N/A;  . MELANOMA EXCISION  2016   Dr. Evorn Gong  . [place]  2002  . RIGHT OOPHORECTOMY    . SHOULDER ARTHROSCOPY WITH OPEN ROTATOR CUFF REPAIR Right 01/07/2016   Procedure: SHOULDER ARTHROSCOPY WITH DEBRIDMENT, SUBACHROMIAL DECOMPRESSION;  Surgeon: Corky Mull, MD;  Location: [facility];  Service: Orthopedics;  Laterality: Right;  . SHOULDER ARTHROSCOPY WITH OPEN ROTATOR CUFF REPAIR Right 03/16/2017   Procedure: SHOULDER ARTHROSCOPY WITH OPEN ROTATOR CUFF REPAIR POSSIBLE BICEPS TENODESIS;  Surgeon: Corky Mull, MD;  Location: [facility];  Service: Orthopedics;  Laterality: Right;  . TRIGGER FINGER RELEASE Right     [place]    Social History:   reports that she quit smoking about 25 years ago. Her smoking use included cigarettes. She has never used smokeless tobacco. She reports that she does not drink alcohol and does not use drugs.  Allergies  Allergen Reactions  . Lipitor [Atorvastatin] Other (See Comments)    Leg pains  . Tramadol Other (See Comments)    Mouth feels like it's on fire    Family History  Problem Relation Age of Onset  . Hypertension Mother   . Anxiety disorder Mother   . Depression Mother   . Bipolar disorder Mother   . Heart disease Mother        No details  . Hyperlipidemia Mother   . Kidney disease Father   . Heart disease Father 99  . Hypertension Father   . Diabetes Father   . Stroke Father   . Colon cancer Father        dx in his 72's  . Anxiety disorder Father   . Depression Father   . Skin cancer Father   . Kidney disease Sister   . Thyroid nodules Sister   . Hypertension Sister   . Hypertension Sister   . Diabetes Sister   . Hyperlipidemia Sister   . Depression Sister   . Breast cancer Maternal Aunt 83  . Breast cancer Maternal Aunt 36  . Ovarian cancer Cousin   . Colon cancer Cousin   . Kidney cancer Cousin   . Breast cancer Other    . Bladder Cancer Neg Hx      Prior to Admission medications   Medication Sig Start Date End Date Taking? Authorizing Provider  ALPRAZolam Duanne Moron) 1 MG tablet Take 1 tablet by mouth twice daily as needed Patient taking differently: Take 1 mg by mouth 2 (two) times daily as needed for anxiety.  08/14/19  Yes [name], MD  amLODipine (NORVASC) 10 MG tablet Take 0.5 tablets (5 mg total) by mouth daily. 08/22/19  Yes Minna Merritts, MD  aspirin 81 MG EC tablet Take 81 mg by mouth daily.    Yes [provider]  buPROPion (WELLBUTRIN XL) 300 MG 24 hr  tablet Take 1 tablet (300 mg total) by mouth daily. 03/15/19  Yes [name], MD  celecoxib (CELEBREX) 200 MG capsule Take 200 mg by mouth 2 (two) times daily as needed for mild pain or moderate pain.  08/10/19  Yes [provider]  Cholecalciferol 50 MCG (2000 UT) TABS Take 2,000 Units by mouth daily.   Yes [provider]  diphenoxylate-atropine (LOMOTIL) 2.5-0.025 MG tablet TAKE 1 TABLET BY MOUTH 1 TO 4 TIMES DAILY FOR DIARRHEA. Patient taking differently: Take 1 tablet by mouth 4 (four) times daily as needed for diarrhea or loose stools.  08/10/19  Yes Fisher, Kirstie Peri, MD  EUTHYROX 25 MCG tablet TAKE 1 TABLET BY MOUTH ONCE DAILY BEFORE BREAKFAST Patient taking differently: Take 25 mcg by mouth daily before breakfast.  08/01/19  Yes [name], MD  gabapentin (NEURONTIN) 300 MG capsule Take 1 capsule (300 mg total) by mouth 2 (two) times daily. 10/12/18  Yes [name], MD  isosorbide mononitrate (IMDUR) 60 MG 24 hr tablet Take 1 tablet (60 mg total) by mouth daily. 05/05/19  Yes Gollan, Kathlene November, MD  losartan (COZAAR) 25 MG tablet Take 25 mg by mouth daily. 04/05/19  Yes [provider]  Multiple Vitamin (MULTI-VITAMIN) tablet Take 1 tablet by mouth daily.    Yes [provider]  nitrofurantoin, macrocrystal-monohydrate, (MACROBID) 100 MG capsule Take 1 capsule (100 mg total) by mouth 2  (two) times daily for 7 days. 08/28/19 09/04/19 Yes [name], MD  pantoprazole (PROTONIX) 40 MG tablet Take 1 tablet by mouth twice daily Patient taking differently: Take 40 mg by mouth 2 (two) times daily.  06/06/19  Yes [name], MD  promethazine (PHENERGAN) 25 MG tablet Take 25 mg by mouth every 6 (six) hours as needed for nausea or vomiting.  07/25/19  Yes [provider]  propranolol ER (INDERAL LA) 60 MG 24 hr capsule Take 60 mg by mouth at bedtime. 06/05/19  Yes [provider]  traZODone (DESYREL) 100 MG tablet Take 100-200 mg by mouth at bedtime as needed for sleep.  03/06/19  Yes [provider]  venlafaxine XR (EFFEXOR-XR) 75 MG 24 hr capsule Take 2 capsules (150 mg total) by mouth daily with breakfast. 08/22/19  Yes Fisher, Kirstie Peri, MD  nitroGLYCERIN (NITROSTAT) 0.4 MG SL tablet Place 1 tablet (0.4 mg total) under the tongue every 5 (five) minutes as needed for chest pain. 04/24/19   Minna Merritts, MD    Physical Exam: Vitals:   09/02/19 1855 09/02/19 2042  BP: (!) 146/74 (!) 193/76  Pulse: 56 53  Resp: 18 (!) 10  Temp: 97.7 F (36.5 C) 97.9 F (36.6 C)  TempSrc: Oral Oral  SpO2: 100% 99%  Weight: 65.8 kg   Height: _0  (1.575 m)     Constitutional: NAD, calm  Eyes: PERTLA, lids and conjunctivae normal ENMT: Mucous membranes are moist. Posterior pharynx clear of any exudate or lesions.   Neck: normal, supple, no masses, no thyromegaly Respiratory:  no wheezing, no crackles. No accessory muscle use.  Cardiovascular: S1 & S2 heard, regular rate and rhythm. No extremity edema.   Abdomen: No distension, no tenderness, soft. Bowel sounds active.  Musculoskeletal: no clubbing / cyanosis. No joint deformity upper and lower extremities.   Skin: no significant rashes, lesions, ulcers. Warm, dry, well-perfused. Neurologic: CN 2-12 grossly intact. Sensation intact. Moving all extremities.  Psychiatric: Alert and oriented to person, place, and  situation. Pleasant and  cooperative.    Labs and Imaging on Admission: I have personally reviewed following labs and imaging studies  CBC: Recent Labs  Lab 09/01/19 0817 09/02/19 1905  WBC 12.5* 12.4*  HGB 12.2 13.7  HCT 37.0 41.2  MCV 84.1 83.7  PLT 270 627   Basic Metabolic Panel: Recent Labs  Lab 09/01/19 0817 09/02/19 1905  NA 139 142  K 3.5 3.9  CL 114* 111  CO2 18* 24  GLUCOSE 65* 62*  BUN 30* 22*  CREATININE 1.34* 1.39*  CALCIUM 8.2* 8.5*   GFR: Estimated Creatinine Clearance: 43.1 mL/min (A) (by C-G formula based on SCr of 1.39 mg/dL (H)). Liver Function Tests: No results for input(s): AST, ALT, ALKPHOS, BILITOT, PROT, ALBUMIN in the last 168 hours. No results for input(s): LIPASE, AMYLASE in the last 168 hours. No results for input(s): AMMONIA in the last 168 hours. Coagulation Profile: No results for input(s): INR, PROTIME in the last 168 hours. Cardiac Enzymes: No results for input(s): CKTOTAL, CKMB, CKMBINDEX, TROPONINI in the last 168 hours. BNP (last 3 results) No results for input(s): PROBNP in the last 8760 hours. HbA1C: No results for input(s): HGBA1C in the last 72 hours. CBG: Recent Labs  Lab 09/02/19 1859 09/02/19 2027 09/02/19 2048 09/02/19 2203 09/02/19 2249  GLUCAP 54* 30* 85 61* 30*   Lipid Profile: No results for input(s): CHOL, HDL, LDLCALC, TRIG, CHOLHDL, LDLDIRECT in the last 72 hours. Thyroid Function Tests: No results for input(s): TSH, T4TOTAL, FREET4, T3FREE, THYROIDAB in the last 72 hours. Anemia Panel: No results for input(s): VITAMINB12, FOLATE, FERRITIN, TIBC, IRON, RETICCTPCT in the last 72 hours. Urine analysis:    Component Value Date/Time   COLORURINE YELLOW (A) 09/02/2019 1927   APPEARANCEUR HAZY (A) 09/02/2019 1927   APPEARANCEUR Cloudy (A) 07/17/2014 1455   LABSPEC 1.009 09/02/2019 1927   LABSPEC 1.012 12/29/2013 1210   PHURINE 5.0 09/02/2019 1927   GLUCOSEU NEGATIVE 09/02/2019 1927   GLUCOSEU >=500  12/29/2013 1210   HGBUR NEGATIVE 09/02/2019 1927   HGBUR negative 04/14/2010 1435   BILIRUBINUR NEGATIVE 09/02/2019 1927   BILIRUBINUR small 08/22/2019 1014   BILIRUBINUR Negative 07/17/2014 1455   BILIRUBINUR Negative 12/29/2013 [place] 09/02/2019 1927   PROTEINUR NEGATIVE 09/02/2019 1927   UROBILINOGEN 0.2 08/22/2019 1014   UROBILINOGEN negative 04/14/2010 1435   NITRITE NEGATIVE 09/02/2019 1927   LEUKOCYTESUR LARGE (A) 09/02/2019 1927   LEUKOCYTESUR Negative 12/29/2013 1210   Sepsis Labs: _0 (procalcitonin:4,lacticidven:4) ) Recent Results (from the past 240 hour(s))  SARS Coronavirus 2 by RT PCR (hospital order, performed in [place] hospital lab) Nasopharyngeal Nasopharyngeal Swab     Status: None   Collection Time: 09/01/19  3:27 PM   Specimen: Nasopharyngeal Swab  Result Value Ref Range Status   SARS Coronavirus 2 NEGATIVE NEGATIVE Final    Comment: (NOTE) SARS-CoV-2 target nucleic acids are NOT DETECTED.  The SARS-CoV-2 RNA is generally detectable in upper and lower respiratory specimens during the acute phase of infection. The lowest concentration of SARS-CoV-2 viral copies this assay can detect is 250 copies / mL. A negative result does not preclude SARS-CoV-2 infection and should not be used as the sole basis for treatment or other patient management decisions.  A negative result may occur with improper specimen collection / handling, submission of specimen other than nasopharyngeal swab, presence of viral mutation(s) within the areas targeted by this assay, and inadequate number of viral copies (<250 copies / mL). A negative result must be combined with  clinical observations, patient history, and epidemiological information.  Fact Sheet for Patients:   StrictlyIdeas.no  Fact Sheet for Healthcare Providers: BankingDealers.co.za  This test is not yet approved or  cleared by the [place] FDA and has been authorized for detection and/or diagnosis of SARS-CoV-2 by FDA under an Emergency Use Authorization (EUA).  This EUA will remain in effect (meaning this test can be used) for the duration of the COVID-19 declaration under Section 564(b)(1) of the Act, 21 U.S.C. section 360bbb-3(b)(1), unless the authorization is terminated or revoked sooner.  Performed at [facility], [address] [place], [place] [address]      Radiological Exams on Admission: DG Chest 2 View  Result Date: 09/01/2019 CLINICAL DATA:  Chest pain EXAM: CHEST - 2 VIEW COMPARISON:  April 18, 2019 FINDINGS: Lungs are clear. Heart size and pulmonary vascularity are normal. Right-sided aortic arch noted. Postoperative change in the upper left paratracheal region noted. No adenopathy. No bone lesions. IMPRESSION: Right-sided aortic arch.  Heart size normal.  Lungs clear. Electronically Signed   By: Lowella Grip III M.D.   On: 09/01/2019 08:45    EKG: Independently reviewed. Normal sinus rhythm.   Assessment/Plan   1. Hypoglycemia  - Presents with recurrent hypoglycemia, has hx of T2DM but had A1c 5.1% in March 2021 and has been off of diabetes medications  - Patient attributes this to not eating or drinking much due to pain with swallowing  - She continues to have significant drops in glucose despite oral glucose and D5% infusion  - Check cortisol, LFTs, insulin, proinsulin, c-peptide, BHOB, and sulfonylurea panel  - Continue dextrose 10% infusion for now, encourage her to eat and drink, continue frequent CBGs   2. Odynophagia  - Patient reports several weeks of pain with swallowing liquids and solids, and reports recent EGD that was unremarkable per her report  - She is scheduled to see ENT for further evaluation and is tolerating drinks in ED    3. CAD - Patient was in ED yesterday with chest discomfort, had normal HS troponin x3, no ischemic features on EKG, no acute findings  on CXR, and sxs resolved  - Continue ASA, beta-blocker, and ARB    4. CKD IIIa  - SCr is 1.39 on admission, similar to priors  - Renally-dose medications, monitor    5. History of CVA  - Continue ASA    6. Hypothyroidism  - Continue levothyroxine    7. Depression, anxiety  - Continue home regimen     DVT prophylaxis: Lovenox  Code Status: Full  Family Communication: Discussed with patient  Disposition Plan:  Patient is from: Home  Anticipated d/c is to: TBD Anticipated d/c date is: 1-2 days  Patient currently: Having recurrent hypoglycemia despite 5% dextrose infusion  Consults called: None  Admission status: observation    Vianne Bulls, MD [facility]  09/02/2019, 11:20 PM

## 2019-09-02 NOTE — ED Notes (Signed)
Pt given sprite, alert and oriented at this time.

## 2019-09-02 NOTE — ED Notes (Signed)
Pt assisted to bathroom at this time. Steady gait noted and no diziness reported upon standing by pt.

## 2019-09-02 NOTE — ED Notes (Signed)
Pt assisted to stretcher by triage RN. NAD noted, pt able to stand and pivot without complications. No c/o weakness or pain. Pt states "I am just very sleepy and have been sleepy".  Call bell within reach, warm blankets provided.

## 2019-09-02 NOTE — ED Provider Notes (Signed)
[facility] Emergency Department Provider Note   ____________________________________________   I have reviewed the triage vital signs and the nursing notes.   HISTORY  Chief Complaint Hypoglycemia   History limited by: Not Limited   HPI [place] is a 49 y.o. female who presents to the emergency department today because of concerns for continued hypoglycemia. Patient was seen in the emergency department yesterday for the same symptoms and the plan was on admission. She did leave AMA. She states since being home she has continued to have low blood sugar. She does feel weak when this happens. The patient denies any fevers. She denies any significant cough. She states she has noticed her urine is darker although denies any dysuria or bad odor to her urine.  Records reviewed. Per medical record review patient has a history of ER visit yesterday for hypoglycemia.   Past Medical History:  Diagnosis Date  . Acute colitis 01/27/2017  . Acute pyelonephritis   . Anemia    iron deficiency anemia  . Aortic arch aneurysm ([place])   . BRCA negative 2014  . CAD (coronary artery disease)    a. 08/2003 Cath: LAD 30-40-med Rx; b. 11/2014 PCI: LAD 419m(3.25x23 Xience Alpine DES); c. 06/2015 PCI: D1 (2.25x12 Resolute Integrity DES); d. 06/2017 PCI: Patent mLAD stent, D2 95 (PTCA); e. 09/2017 PCI: D2 99ost (CBA); d. 12/2017 Cath: LM nl, LAD 341m80d (small), D1 40ost, D2 95ost, LCX 40p, RCA 40ost/p->Med rx for D2 given restenosis.  . Colitis 06/03/2015  . Colon polyp   . CVA (cerebral vascular accident) (HCHousatonic   Left side weakness.   . Degenerative tear of glenoid labrum of right shoulder 03/15/2017  . Diabetes mellitus without complication (HCYeager  . Family history of breast cancer    BRCA neg 2014  . Femur fracture, left (HCMarco Island9/10/2018  . Gastric ulcer 04/27/2011  . History of echocardiogram    a. 03/2017 Echo: EF 60-65%, no rwma; b. 02/2018 Echo: EF 60-65%, no rwma. Nl  RV fxn. No cardiac source of emboli (admitted w/ stroke).  . Hypertension   . Malignant melanoma of skin of scalp (HCVader  . MI, acute, non ST segment elevation (HCSouth Greens[place]  . Neuromuscular disorder (HCCalumet  . S/P drug eluting coronary stent placement 06/04/2015  . Sepsis (HCLanesville2/14/2019  . Stroke ([facility]   a. 02/2018 MRI: 19m63mate acute/early subacute L medial frontal lobe inarct; b. 02/2018 MRA No large vessel occlusion or aneurysm. Mod to sev L P2 stenosis. thready L vertebral artery, diffusely dzs'd; c. 02/2018 Carotid U/S: <50% bilat ICA dzs.    Patient Active Problem List   Diagnosis Date Noted  . Hypoglycemia   . Family history of breast cancer 05/23/2019  . Family history of colon cancer 05/23/2019  . Abnormal LFTs 04/18/2019  . Esophageal dysphagia   . Gastrointestinal tract imaging abnormality   . Trochanteric bursitis, left hip 03/17/2019  . Malignant neoplasm of vulva, unspecified (HCCFishhook2/06/2019  . Peripheral vascular disease, unspecified (HCCFern Acres2/06/2019  . Paroxysmal supraventricular tachycardia (HCCBrookdale2/06/2019  . Contusion of left hip 03/06/2019  . Chest pain, moderate coronary artery risk 01/27/2019  . Melena   . Hypertensive urgency   . Recurrent chest pain 01/19/2019  . Depression, major, single episode, moderate (HCCSpring Valley1/07/2018  . Closed nondisplaced intertrochanteric fracture of left femur (HCCWatkins9/10/2018  . Hypotension 06/26/2018  . Autonomic neuropathy 03/24/2018  . Acute delirium 03/03/2018  . H/O  gastric bypass 03/02/2018  . Hypertension associated with stage 3 chronic kidney disease due to type 2 diabetes mellitus ([place]) 02/20/2018  . SI (sacroiliac) joint dysfunction 12/02/2017  . Effort angina ([place]) 06/29/2017  . Unstable angina ([place]) 06/24/2017  . Syncope 04/09/2017  . Insomnia 03/18/2017  . Ischemic cardiomyopathy   . Arthritis   . Anxiety   . Tendinitis of upper biceps tendon of right shoulder 03/16/2017  . Hx of colonic polyps   . H/O medication  noncompliance 12/14/2015  . CAD in native artery   . Hypertensive heart disease   . Type 2 DM with CKD stage 4 and hypertension ([place]) 06/05/2015  . Status post bariatric surgery 06/04/2015  . Carotid stenosis 04/30/2015  . Stable angina pectoris ([place]) 04/17/2015  . Iron deficiency anemia 03/22/2015  . Vitamin B12 deficiency 02/18/2015  . Misuse of medications for pain 02/18/2015  . Major depressive disorder, recurrent, severe with psychotic features ([place]) 02/15/2015  . Helicobacter pylori infection 11/23/2014  . Hemiparesis, left ([place]) 11/23/2014  . Malignant melanoma ([place]) 08/25/2014  . Chronic systolic CHF (congestive heart failure) ([place])   . Incomplete bladder emptying 07/12/2014  . Hypothyroidism 12/30/2013  . Aberrant subclavian artery 11/17/2013  . Multiple sclerosis ([place]) 11/02/2013  . History of CVA with residual deficit 06/20/2013  . Headache, migraine 05/29/2013  . Hyperlipidemia   . GERD (gastroesophageal reflux disease)   . Neuropathy ([place]) 01/02/2011  . Stroke ([place]) 06/21/2008  . Depression with anxiety 05/01/2008  . Essential hypertension 05/01/2008    Past Surgical History:  Procedure Laterality Date  . APPENDECTOMY    . CARDIAC CATHETERIZATION N/A 11/09/2014   Procedure: Coronary Angiography;  Surgeon: Minna Merritts, MD;  Location: [place] CV LAB;  Service: Cardiovascular;  Laterality: N/A;  . CARDIAC CATHETERIZATION N/A 11/12/2014   Procedure: Coronary Stent Intervention;  Surgeon: Isaias Cowman, MD;  Location: [place] CV LAB;  Service: Cardiovascular;  Laterality: N/A;  . CARDIAC CATHETERIZATION N/A 04/18/2015   Procedure: Left Heart Cath and Coronary Angiography;  Surgeon: Minna Merritts, MD;  Location: [place] CV LAB;  Service: Cardiovascular;  Laterality: N/A;  . CARDIAC CATHETERIZATION Left 06/04/2015   Procedure: Left Heart Cath and Coronary Angiography;  Surgeon: Wellington Hampshire, MD;  Location: [place] CV LAB;  Service:  Cardiovascular;  Laterality: Left;  . CARDIAC CATHETERIZATION N/A 06/04/2015   Procedure: Coronary Stent Intervention;  Surgeon: Wellington Hampshire, MD;  Location: [place] CV LAB;  Service: Cardiovascular;  Laterality: N/A;  . CESAREAN SECTION  2001  . CHOLECYSTECTOMY N/A 11/18/2016   Procedure: LAPAROSCOPIC CHOLECYSTECTOMY WITH INTRAOPERATIVE CHOLANGIOGRAM;  Surgeon: Christene Lye, MD;  Location: [facility] ORS;  Service: General;  Laterality: N/A;  . COLONOSCOPY WITH PROPOFOL N/A 04/27/2016   Procedure: COLONOSCOPY WITH PROPOFOL;  Surgeon: [name], MD;  Location: [place];  Service: Endoscopy;  Laterality: N/A;  . COLONOSCOPY WITH PROPOFOL N/A 01/12/2018   Procedure: COLONOSCOPY WITH PROPOFOL;  Surgeon: Toledo, Benay Pike, MD;  Location: [facility] ENDOSCOPY;  Service: Endoscopy;  Laterality: N/A;  . CORONARY ANGIOPLASTY    . CORONARY BALLOON ANGIOPLASTY N/A 06/29/2017   Procedure: CORONARY BALLOON ANGIOPLASTY;  Surgeon: Wellington Hampshire, MD;  Location: [place] CV LAB;  Service: Cardiovascular;  Laterality: N/A;  . CORONARY BALLOON ANGIOPLASTY N/A 09/20/2017   Procedure: CORONARY BALLOON ANGIOPLASTY;  Surgeon: Wellington Hampshire, MD;  Location: [place] CV LAB;  Service: Cardiovascular;  Laterality: N/A;  . DILATION AND CURETTAGE OF UTERUS    .  ESOPHAGOGASTRODUODENOSCOPY (EGD) WITH PROPOFOL N/A 09/14/2014   Procedure: ESOPHAGOGASTRODUODENOSCOPY (EGD) WITH PROPOFOL;  Surgeon: Josefine Class, MD;  Location: [facility] ENDOSCOPY;  Service: Endoscopy;  Laterality: N/A;  . ESOPHAGOGASTRODUODENOSCOPY (EGD) WITH PROPOFOL N/A 04/27/2016   Procedure: ESOPHAGOGASTRODUODENOSCOPY (EGD) WITH PROPOFOL;  Surgeon: [name], MD;  Location: [place];  Service: Endoscopy;  Laterality: N/A;  Diabetic - oral meds  . ESOPHAGOGASTRODUODENOSCOPY (EGD) WITH PROPOFOL N/A 01/12/2018   Procedure: ESOPHAGOGASTRODUODENOSCOPY (EGD) WITH PROPOFOL;  Surgeon: Tol[place], Benay Pike, MD;  Location:  [facility] ENDOSCOPY;  Service: Endoscopy;  Laterality: N/A;  . ESOPHAGOGASTRODUODENOSCOPY (EGD) WITH PROPOFOL N/A 04/11/2019   Procedure: ESOPHAGOGASTRODUODENOSCOPY (EGD) WITH PROPOFOL;  Surgeon: [name], MD;  Location: [facility] ENDOSCOPY;  Service: Endoscopy;  Laterality: N/A;  . GASTRIC BYPASS  09/2009   [place] (IM) NAIL INTERTROCHANTERIC Left 10/13/2018   Procedure: INTRAMEDULLARY (IM) NAIL INTERTROCHANTRIC;  Surgeon: Leandrew Koyanagi, MD;  Location: [place];  Service: Orthopedics;  Laterality: Left;  . Left Carotid to sublcavian artery bypass w/ subclavian artery ligation     a. Performed @ [place].  . LEFT HEART CATH AND CORONARY ANGIOGRAPHY Left 06/29/2017   Procedure: LEFT HEART CATH AND CORONARY ANGIOGRAPHY;  Surgeon: Wellington Hampshire, MD;  Location: [place] CV LAB;  Service: Cardiovascular;  Laterality: Left;  . LEFT HEART CATH AND CORONARY ANGIOGRAPHY N/A 09/20/2017   Procedure: LEFT HEART CATH AND CORONARY ANGIOGRAPHY;  Surgeon: Wellington Hampshire, MD;  Location: [place] CV LAB;  Service: Cardiovascular;  Laterality: N/A;  . LEFT HEART CATH AND CORONARY ANGIOGRAPHY N/A 12/20/2017   Procedure: LEFT HEART CATH AND CORONARY ANGIOGRAPHY;  Surgeon: Wellington Hampshire, MD;  Location: [place] CV LAB;  Service: Cardiovascular;  Laterality: N/A;  . LEFT HEART CATH AND CORONARY ANGIOGRAPHY N/A 04/20/2019   Procedure: LEFT HEART CATH AND CORONARY ANGIOGRAPHY possible PCI;  Surgeon: Minna Merritts, MD;  Location: [place] CV LAB;  Service: Cardiovascular;  Laterality: N/A;  . MELANOMA EXCISION  2016   Dr. Evorn Gong  . [place]  2002  . RIGHT OOPHORECTOMY    . SHOULDER ARTHROSCOPY WITH OPEN ROTATOR CUFF REPAIR Right 01/07/2016   Procedure: SHOULDER ARTHROSCOPY WITH DEBRIDMENT, SUBACHROMIAL DECOMPRESSION;  Surgeon: Corky Mull, MD;  Location: [facility];  Service: Orthopedics;  Laterality: Right;  . SHOULDER ARTHROSCOPY WITH OPEN ROTATOR CUFF  REPAIR Right 03/16/2017   Procedure: SHOULDER ARTHROSCOPY WITH OPEN ROTATOR CUFF REPAIR POSSIBLE BICEPS TENODESIS;  Surgeon: Corky Mull, MD;  Location: [facility];  Service: Orthopedics;  Laterality: Right;  . TRIGGER FINGER RELEASE Right     [place]    Prior to Admission medications   Medication Sig Start Date End Date Taking? Authorizing Provider  ALPRAZolam Duanne Moron) 1 MG tablet Take 1 tablet by mouth twice daily as needed Patient taking differently: Take 1 mg by mouth 2 (two) times daily as needed for anxiety.  08/14/19   [name], MD  amLODipine (NORVASC) 10 MG tablet Take 0.5 tablets (5 mg total) by mouth daily. 08/22/19   Minna Merritts, MD  aspirin 81 MG EC tablet Take 81 mg by mouth daily.     [provider]  buPROPion (WELLBUTRIN XL) 300 MG 24 hr tablet Take 1 tablet (300 mg total) by mouth daily. 03/15/19   [name], MD  celecoxib (CELEBREX) 200 MG capsule Take 200 mg by mouth 2 (two) times daily as needed for mild pain or moderate pain.  08/10/19  [provider]  Cholecalciferol 50 MCG (2000 UT) TABS Take 2,000 Units by mouth daily.    [provider]  diphenoxylate-atropine (LOMOTIL) 2.5-0.025 MG tablet TAKE 1 TABLET BY MOUTH 1 TO 4 TIMES DAILY FOR DIARRHEA. Patient taking differently: Take 1 tablet by mouth 4 (four) times daily as needed for diarrhea or loose stools.  08/10/19   [name], MD  EUTHYROX 25 MCG tablet TAKE 1 TABLET BY MOUTH ONCE DAILY BEFORE BREAKFAST Patient taking differently: Take 25 mcg by mouth daily before breakfast.  08/01/19   [name], MD  gabapentin (NEURONTIN) 300 MG capsule Take 1 capsule (300 mg total) by mouth 2 (two) times daily. 10/12/18   [name], MD  isosorbide mononitrate (IMDUR) 60 MG 24 hr tablet Take 1 tablet (60 mg total) by mouth daily. 05/05/19   Minna Merritts, MD  losartan (COZAAR) 25 MG tablet Take 25 mg by mouth daily. 04/05/19   [provider]  Multiple Vitamin  (MULTI-VITAMIN) tablet Take 1 tablet by mouth daily.     [provider]  nitrofurantoin, macrocrystal-monohydrate, (MACROBID) 100 MG capsule Take 1 capsule (100 mg total) by mouth 2 (two) times daily for 7 days. 08/28/19 09/04/19  [name], MD  nitroGLYCERIN (NITROSTAT) 0.4 MG SL tablet Place 1 tablet (0.4 mg total) under the tongue every 5 (five) minutes as needed for chest pain. 04/24/19   Minna Merritts, MD  pantoprazole (PROTONIX) 40 MG tablet Take 1 tablet by mouth twice daily Patient taking differently: Take 40 mg by mouth 2 (two) times daily.  06/06/19   [name], MD  promethazine (PHENERGAN) 25 MG tablet Take 25 mg by mouth every 6 (six) hours as needed for nausea or vomiting.  07/25/19   [provider]  propranolol ER (INDERAL LA) 60 MG 24 hr capsule Take 60 mg by mouth at bedtime. 06/05/19   [provider]  traZODone (DESYREL) 100 MG tablet Take 100-200 mg by mouth at bedtime as needed for sleep.  03/06/19   [provider]  venlafaxine XR (EFFEXOR-XR) 75 MG 24 hr capsule Take 2 capsules (150 mg total) by mouth daily with breakfast. 08/22/19   [name], MD    Allergies Lipitor [atorvastatin] and Tramadol  Family History  Problem Relation Age of Onset  . Hypertension Mother   . Anxiety disorder Mother   . Depression Mother   . Bipolar disorder Mother   . Heart disease Mother        No details  . Hyperlipidemia Mother   . Kidney disease Father   . Heart disease Father 4  . Hypertension Father   . Diabetes Father   . Stroke Father   . Colon cancer Father        dx in his 59's  . Anxiety disorder Father   . Depression Father   . Skin cancer Father   . Kidney disease Sister   . Thyroid nodules Sister   . Hypertension Sister   . Hypertension Sister   . Diabetes Sister   . Hyperlipidemia Sister   . Depression Sister   . Breast cancer Maternal Aunt 23  . Breast cancer Maternal Aunt 80  . Ovarian cancer Cousin   .  Colon cancer Cousin   . Kidney cancer Cousin   . Breast cancer Other   . Bladder Cancer Neg Hx     Social History Social History   Tobacco Use  . Smoking status: Former  Smoker    Types: Cigarettes    Quit date: 08/31/1994    Years since quitting: 25.0  . Smokeless tobacco: Never Used  . Tobacco comment: quit 28 years ago  Vaping Use  . Vaping Use: Never used  Substance Use Topics  . Alcohol use: No    Alcohol/week: 0.0 standard drinks  . Drug use: No    Review of Systems Constitutional: No fever/chills Eyes: No visual changes. ENT: No sore throat. Cardiovascular: Denies chest pain. Respiratory: Denies shortness of breath. Gastrointestinal: No abdominal pain.  No nausea, no vomiting.  No diarrhea.   Genitourinary: Negative for dysuria. Dark urine. Musculoskeletal: Negative for back pain. Skin: Negative for rash. Neurological: Negative for headaches, focal weakness or numbness.  ____________________________________________   PHYSICAL EXAM:  VITAL SIGNS: ED Triage Vitals [09/02/19 1855]  Enc Vitals Group     BP (!) 146/74     Pulse Rate 56     Resp 18     Temp 97.7 F (36.5 C)     Temp Source Oral     SpO2 100 %     Weight 145 lb (65.8 kg)     Height '5\' 2"'  (1.575 m)     Head Circumference      Peak Flow      Pain Score 0   Constitutional: Alert and oriented.  Eyes: Conjunctivae are normal.  ENT      Head: Normocephalic and atraumatic.      Nose: No congestion/rhinnorhea.      Mouth/Throat: Mucous membranes are moist.      Neck: No stridor. Hematological/Lymphatic/Immunilogical: No cervical lymphadenopathy. Cardiovascular: Normal rate, regular rhythm.  No murmurs, rubs, or gallops.  Respiratory: Normal respiratory effort without tachypnea nor retractions. Breath sounds are clear and equal bilaterally. No wheezes/rales/rhonchi. Gastrointestinal: Soft and non tender. No rebound. No guarding.  Genitourinary: Deferred Musculoskeletal: Normal range of motion  in all extremities. No lower extremity edema. Neurologic:  Normal speech and language. No gross focal neurologic deficits are appreciated.  Skin:  Skin is warm, dry and intact. No rash noted. Psychiatric: Mood and affect are normal. Speech and behavior are normal. Patient exhibits appropriate insight and judgment.  ____________________________________________    LABS (pertinent positives/negatives)  BMP na 142, k 3.9, glu 62, cr 1.39 UA large leukocytes, >50 wbc, rare bacteria CBC wbc 12.4, hgb 13.7, plt 234  ____________________________________________   EKG  None  ____________________________________________    RADIOLOGY  None  ____________________________________________   PROCEDURES  Procedures  CRITICAL CARE Performed by: Nance Pear   Total critical care time: 30 minutes  Critical care time was exclusive of separately billable procedures and treating other patients.  Critical care was necessary to treat or prevent imminent or life-threatening deterioration.  Critical care was time spent personally by me on the following activities: development of treatment plan with patient and/or surrogate as well as nursing, discussions with consultants, evaluation of patient's response to treatment, examination of patient, obtaining history from patient or surrogate, ordering and performing treatments and interventions, ordering and review of laboratory studies, ordering and review of radiographic studies, pulse oximetry and re-evaluation of patient's condition.  ____________________________________________   INITIAL IMPRESSION / ASSESSMENT AND PLAN / ED COURSE  Pertinent labs & imaging results that were available during my care of the patient were reviewed by me and considered in my medical decision making (see chart for details).   Patient presented to the emergency department today because of concerns for continued hypoglycemia. Patient was seen in  the emergency  department and was admitted yesterday the left AMA. Patient continued to be hypoglycemic. Patient was placed initially on a D5 drip. Patient's urine is concerning for urinary tract patient. I do think this could be related to the hypoglycemia. Will plan on starting IV antibiotics. Will plan admission. Discussed findings plan with patient.  ____________________________________________   FINAL CLINICAL IMPRESSION(S) / ED DIAGNOSES  Final diagnoses:  Hypoglycemia  Urinary tract infection without hematuria, site unspecified     Note: This dictation was prepared with Dragon dictation. Any transcriptional errors that result from this process are unintentional     Nance Pear, MD 09/02/19 2309

## 2019-09-03 ENCOUNTER — Other Ambulatory Visit: Payer: Self-pay

## 2019-09-03 DIAGNOSIS — I5020 Unspecified systolic (congestive) heart failure: Secondary | ICD-10-CM | POA: Diagnosis not present

## 2019-09-03 DIAGNOSIS — R251 Tremor, unspecified: Secondary | ICD-10-CM | POA: Diagnosis present

## 2019-09-03 DIAGNOSIS — I25110 Atherosclerotic heart disease of native coronary artery with unstable angina pectoris: Secondary | ICD-10-CM | POA: Diagnosis present

## 2019-09-03 DIAGNOSIS — R1310 Dysphagia, unspecified: Secondary | ICD-10-CM | POA: Diagnosis present

## 2019-09-03 DIAGNOSIS — E039 Hypothyroidism, unspecified: Secondary | ICD-10-CM | POA: Diagnosis present

## 2019-09-03 DIAGNOSIS — E785 Hyperlipidemia, unspecified: Secondary | ICD-10-CM | POA: Diagnosis present

## 2019-09-03 DIAGNOSIS — R1319 Other dysphagia: Secondary | ICD-10-CM | POA: Diagnosis not present

## 2019-09-03 DIAGNOSIS — E46 Unspecified protein-calorie malnutrition: Secondary | ICD-10-CM | POA: Diagnosis present

## 2019-09-03 DIAGNOSIS — N1831 Chronic kidney disease, stage 3a: Secondary | ICD-10-CM | POA: Diagnosis present

## 2019-09-03 DIAGNOSIS — F0390 Unspecified dementia without behavioral disturbance: Secondary | ICD-10-CM | POA: Diagnosis present

## 2019-09-03 DIAGNOSIS — F418 Other specified anxiety disorders: Secondary | ICD-10-CM | POA: Diagnosis present

## 2019-09-03 DIAGNOSIS — I130 Hypertensive heart and chronic kidney disease with heart failure and stage 1 through stage 4 chronic kidney disease, or unspecified chronic kidney disease: Secondary | ICD-10-CM | POA: Diagnosis present

## 2019-09-03 DIAGNOSIS — R001 Bradycardia, unspecified: Secondary | ICD-10-CM | POA: Diagnosis present

## 2019-09-03 DIAGNOSIS — K219 Gastro-esophageal reflux disease without esophagitis: Secondary | ICD-10-CM | POA: Diagnosis present

## 2019-09-03 DIAGNOSIS — I5032 Chronic diastolic (congestive) heart failure: Secondary | ICD-10-CM | POA: Diagnosis present

## 2019-09-03 DIAGNOSIS — R079 Chest pain, unspecified: Secondary | ICD-10-CM | POA: Diagnosis not present

## 2019-09-03 DIAGNOSIS — E1122 Type 2 diabetes mellitus with diabetic chronic kidney disease: Secondary | ICD-10-CM | POA: Diagnosis present

## 2019-09-03 DIAGNOSIS — I255 Ischemic cardiomyopathy: Secondary | ICD-10-CM | POA: Diagnosis present

## 2019-09-03 DIAGNOSIS — E1151 Type 2 diabetes mellitus with diabetic peripheral angiopathy without gangrene: Secondary | ICD-10-CM | POA: Diagnosis present

## 2019-09-03 DIAGNOSIS — G35 Multiple sclerosis: Secondary | ICD-10-CM | POA: Diagnosis present

## 2019-09-03 DIAGNOSIS — N183 Chronic kidney disease, stage 3 unspecified: Secondary | ICD-10-CM | POA: Diagnosis not present

## 2019-09-03 DIAGNOSIS — I10 Essential (primary) hypertension: Secondary | ICD-10-CM | POA: Diagnosis not present

## 2019-09-03 DIAGNOSIS — J9811 Atelectasis: Secondary | ICD-10-CM | POA: Diagnosis not present

## 2019-09-03 DIAGNOSIS — I25119 Atherosclerotic heart disease of native coronary artery with unspecified angina pectoris: Secondary | ICD-10-CM | POA: Diagnosis not present

## 2019-09-03 DIAGNOSIS — I6329 Cerebral infarction due to unspecified occlusion or stenosis of other precerebral arteries: Secondary | ICD-10-CM | POA: Diagnosis not present

## 2019-09-03 DIAGNOSIS — E162 Hypoglycemia, unspecified: Secondary | ICD-10-CM | POA: Diagnosis not present

## 2019-09-03 DIAGNOSIS — Z20822 Contact with and (suspected) exposure to covid-19: Secondary | ICD-10-CM | POA: Diagnosis present

## 2019-09-03 DIAGNOSIS — R42 Dizziness and giddiness: Secondary | ICD-10-CM | POA: Diagnosis present

## 2019-09-03 DIAGNOSIS — E11649 Type 2 diabetes mellitus with hypoglycemia without coma: Secondary | ICD-10-CM | POA: Diagnosis present

## 2019-09-03 DIAGNOSIS — E1142 Type 2 diabetes mellitus with diabetic polyneuropathy: Secondary | ICD-10-CM | POA: Diagnosis present

## 2019-09-03 DIAGNOSIS — Z9884 Bariatric surgery status: Secondary | ICD-10-CM | POA: Diagnosis not present

## 2019-09-03 DIAGNOSIS — D509 Iron deficiency anemia, unspecified: Secondary | ICD-10-CM | POA: Diagnosis present

## 2019-09-03 DIAGNOSIS — I25118 Atherosclerotic heart disease of native coronary artery with other forms of angina pectoris: Secondary | ICD-10-CM | POA: Diagnosis not present

## 2019-09-03 DIAGNOSIS — E876 Hypokalemia: Secondary | ICD-10-CM | POA: Diagnosis not present

## 2019-09-03 LAB — GLUCOSE, CAPILLARY
Glucose-Capillary: 36 mg/dL — CL (ref 70–99)
Glucose-Capillary: 78 mg/dL (ref 70–99)
Glucose-Capillary: 50 mg/dL — ABNORMAL LOW (ref 70–99)
Glucose-Capillary: 152 mg/dL — ABNORMAL HIGH (ref 70–99)
Glucose-Capillary: 57 mg/dL — ABNORMAL LOW (ref 70–99)
Glucose-Capillary: 63 mg/dL — ABNORMAL LOW (ref 70–99)
Glucose-Capillary: 81 mg/dL (ref 70–99)
Glucose-Capillary: 104 mg/dL — ABNORMAL HIGH (ref 70–99)
Glucose-Capillary: 170 mg/dL — ABNORMAL HIGH (ref 70–99)
Glucose-Capillary: 41 mg/dL — CL (ref 70–99)
Glucose-Capillary: 99 mg/dL (ref 70–99)
Glucose-Capillary: 59 mg/dL — ABNORMAL LOW (ref 70–99)
Glucose-Capillary: 105 mg/dL — ABNORMAL HIGH (ref 70–99)
Glucose-Capillary: 168 mg/dL — ABNORMAL HIGH (ref 70–99)
Glucose-Capillary: 169 mg/dL — ABNORMAL HIGH (ref 70–99)
Glucose-Capillary: 89 mg/dL (ref 70–99)
Glucose-Capillary: 75 mg/dL (ref 70–99)
Glucose-Capillary: 84 mg/dL (ref 70–99)

## 2019-09-03 LAB — HEPATIC FUNCTION PANEL
ALT: 61 U/L — ABNORMAL HIGH (ref 0–44)
AST: 61 U/L — ABNORMAL HIGH (ref 15–41)
Albumin: 3.5 g/dL (ref 3.5–5.0)
Alkaline Phosphatase: 125 U/L (ref 38–126)
Bilirubin, Direct: <0.1 mg/dL (ref 0.0–0.2)
Total Bilirubin: 0.6 mg/dL (ref 0.3–1.2)
Total Protein: 6.8 g/dL (ref 6.5–8.1)

## 2019-09-03 LAB — HEMOGLOBIN A1C
Hgb A1c MFr Bld: 4.8 % (ref 4.8–5.6)
Mean Plasma Glucose: 91.06 mg/dL

## 2019-09-03 LAB — MRSA PCR SCREENING: MRSA by PCR: POSITIVE — AB

## 2019-09-03 LAB — BETA-HYDROXYBUTYRIC ACID: Beta-Hydroxybutyric Acid: 0.06 mmol/L (ref 0.05–0.27)

## 2019-09-03 LAB — CORTISOL: Cortisol, Plasma: 25.2 ug/dL

## 2019-09-03 MED ORDER — PROPRANOLOL HCL ER 60 MG PO CP24
60.0000 mg | ORAL_CAPSULE | Freq: Every day | ORAL | Status: DC
  Administered 2019-09-03 – 2019-09-06 (×4): 60 mg via ORAL
  Filled 2019-09-03 (×8): qty 1

## 2019-09-03 MED ORDER — DEXTROSE 10 % IV SOLN: INTRAVENOUS | Status: DC

## 2019-09-03 MED ORDER — DEXTROSE 50 % IV SOLN
INTRAVENOUS | Status: AC
  Administered 2019-09-03: 50 mL
  Filled 2019-09-03: qty 50

## 2019-09-03 MED ORDER — MUPIROCIN 2 % EX OINT
1.0000 "application " | TOPICAL_OINTMENT | Freq: Two times a day (BID) | CUTANEOUS | Status: AC
  Administered 2019-09-03 – 2019-09-07 (×10): 1 via NASAL
  Filled 2019-09-03 (×2): qty 22

## 2019-09-03 MED ORDER — ONDANSETRON HCL 4 MG/2ML IJ SOLN
4.0000 mg | Freq: Four times a day (QID) | INTRAMUSCULAR | Status: DC | PRN
  Administered 2019-09-03 – 2019-09-08 (×2): 4 mg via INTRAVENOUS
  Filled 2019-09-03 (×4): qty 2

## 2019-09-03 MED ORDER — AMLODIPINE BESYLATE 5 MG PO TABS
5.0000 mg | ORAL_TABLET | Freq: Every day | ORAL | Status: DC
  Administered 2019-09-03 – 2019-09-12 (×10): 5 mg via ORAL
  Filled 2019-09-03 (×11): qty 1

## 2019-09-03 MED ORDER — GABAPENTIN 300 MG PO CAPS
300.0000 mg | ORAL_CAPSULE | Freq: Two times a day (BID) | ORAL | Status: DC
  Administered 2019-09-03 – 2019-09-12 (×18): 300 mg via ORAL
  Filled 2019-09-03 (×19): qty 1

## 2019-09-03 MED ORDER — DEXTROSE 50 % IV SOLN
25.0000 mL | Freq: Once | INTRAVENOUS | Status: AC
  Administered 2019-09-03: 25 mL via INTRAVENOUS
  Filled 2019-09-03: qty 50

## 2019-09-03 MED ORDER — ASPIRIN EC 81 MG PO TBEC
81.0000 mg | DELAYED_RELEASE_TABLET | Freq: Every day | ORAL | Status: DC
  Administered 2019-09-04 – 2019-09-12 (×9): 81 mg via ORAL
  Filled 2019-09-03 (×11): qty 1

## 2019-09-03 MED ORDER — ONDANSETRON HCL 4 MG PO TABS: 4.0000 mg | ORAL_TABLET | Freq: Four times a day (QID) | ORAL | Status: DC | PRN

## 2019-09-03 MED ORDER — ACETAMINOPHEN 325 MG PO TABS: 650.0000 mg | ORAL_TABLET | Freq: Four times a day (QID) | ORAL | Status: DC | PRN

## 2019-09-03 MED ORDER — VENLAFAXINE HCL ER 75 MG PO CP24
150.0000 mg | ORAL_CAPSULE | Freq: Every day | ORAL | Status: DC
  Administered 2019-09-04 – 2019-09-12 (×9): 150 mg via ORAL
  Filled 2019-09-03 (×3): qty 2
  Filled 2019-09-03: qty 1
  Filled 2019-09-03 (×7): qty 2

## 2019-09-03 MED ORDER — PROMETHAZINE HCL 25 MG/ML IJ SOLN
12.5000 mg | Freq: Four times a day (QID) | INTRAMUSCULAR | Status: DC | PRN
  Administered 2019-09-03 – 2019-09-07 (×3): 12.5 mg via INTRAVENOUS
  Filled 2019-09-03 (×3): qty 1

## 2019-09-03 MED ORDER — ENOXAPARIN SODIUM 40 MG/0.4ML ~~LOC~~ SOLN
40.0000 mg | SUBCUTANEOUS | Status: DC
  Administered 2019-09-03 – 2019-09-12 (×10): 40 mg via SUBCUTANEOUS
  Filled 2019-09-03 (×10): qty 0.4

## 2019-09-03 MED ORDER — CHLORHEXIDINE GLUCONATE CLOTH 2 % EX PADS
6.0000 | MEDICATED_PAD | Freq: Every day | CUTANEOUS | Status: DC
  Administered 2019-09-03 – 2019-09-12 (×5): 6 via TOPICAL

## 2019-09-03 MED ORDER — TRAZODONE HCL 100 MG PO TABS
100.0000 mg | ORAL_TABLET | Freq: Every evening | ORAL | Status: DC | PRN
  Administered 2019-09-06: 200 mg via ORAL
  Administered 2019-09-11: 100 mg via ORAL
  Filled 2019-09-03 (×3): qty 2

## 2019-09-03 MED ORDER — LEVOTHYROXINE SODIUM 25 MCG PO TABS
25.0000 ug | ORAL_TABLET | Freq: Every day | ORAL | Status: DC
  Administered 2019-09-03 – 2019-09-12 (×10): 25 ug via ORAL
  Filled 2019-09-03 (×11): qty 1

## 2019-09-03 MED ORDER — SENNOSIDES-DOCUSATE SODIUM 8.6-50 MG PO TABS: 1.0000 | ORAL_TABLET | Freq: Every evening | ORAL | Status: DC | PRN

## 2019-09-03 MED ORDER — SODIUM CHLORIDE 0.9% FLUSH
3.0000 mL | Freq: Two times a day (BID) | INTRAVENOUS | Status: DC
  Administered 2019-09-04 – 2019-09-12 (×8): 3 mL via INTRAVENOUS

## 2019-09-03 MED ORDER — CHLORHEXIDINE GLUCONATE CLOTH 2 % EX PADS
6.0000 | MEDICATED_PAD | Freq: Every day | CUTANEOUS | Status: DC
  Administered 2019-09-04 – 2019-09-05 (×2): 6 via TOPICAL

## 2019-09-03 MED ORDER — LOSARTAN POTASSIUM 25 MG PO TABS
25.0000 mg | ORAL_TABLET | Freq: Every day | ORAL | Status: DC
  Administered 2019-09-03 – 2019-09-12 (×10): 25 mg via ORAL
  Filled 2019-09-03 (×11): qty 1

## 2019-09-03 MED ORDER — HYDROCODONE-ACETAMINOPHEN 5-325 MG PO TABS
1.0000 | ORAL_TABLET | Freq: Four times a day (QID) | ORAL | Status: DC | PRN
  Administered 2019-09-03 – 2019-09-11 (×7): 1 via ORAL
  Filled 2019-09-03 (×7): qty 1

## 2019-09-03 MED ORDER — BUPROPION HCL ER (XL) 150 MG PO TB24
300.0000 mg | ORAL_TABLET | Freq: Every day | ORAL | Status: DC
  Administered 2019-09-04 – 2019-09-12 (×9): 300 mg via ORAL
  Filled 2019-09-03 (×3): qty 2
  Filled 2019-09-03: qty 1
  Filled 2019-09-03 (×3): qty 2
  Filled 2019-09-03: qty 1
  Filled 2019-09-03 (×2): qty 2

## 2019-09-03 MED ORDER — SCOPOLAMINE 1 MG/3DAYS TD PT72
1.0000 | MEDICATED_PATCH | TRANSDERMAL | Status: DC
  Administered 2019-09-03 – 2019-09-12 (×4): 1.5 mg via TRANSDERMAL
  Filled 2019-09-03 (×4): qty 1

## 2019-09-03 MED ORDER — ACETAMINOPHEN 650 MG RE SUPP: 650.0000 mg | Freq: Four times a day (QID) | RECTAL | Status: DC | PRN

## 2019-09-03 MED ORDER — ISOSORBIDE MONONITRATE ER 30 MG PO TB24
60.0000 mg | ORAL_TABLET | Freq: Every day | ORAL | Status: DC
  Administered 2019-09-03 – 2019-09-08 (×6): 60 mg via ORAL
  Filled 2019-09-03 (×7): qty 2

## 2019-09-03 MED ORDER — ALPRAZOLAM 1 MG PO TABS
1.0000 mg | ORAL_TABLET | Freq: Two times a day (BID) | ORAL | Status: DC | PRN
  Administered 2019-09-03 – 2019-09-09 (×10): 1 mg via ORAL
  Filled 2019-09-03: qty 2
  Filled 2019-09-03: qty 1
  Filled 2019-09-03: qty 2
  Filled 2019-09-03 (×5): qty 1
  Filled 2019-09-03: qty 2
  Filled 2019-09-03: qty 1

## 2019-09-03 MED ORDER — PANTOPRAZOLE SODIUM 40 MG PO TBEC
40.0000 mg | DELAYED_RELEASE_TABLET | Freq: Two times a day (BID) | ORAL | Status: DC
  Administered 2019-09-03 – 2019-09-12 (×18): 40 mg via ORAL
  Filled 2019-09-03 (×19): qty 1

## 2019-09-03 MED ORDER — GLUCOSE 4 G PO CHEW
CHEWABLE_TABLET | ORAL | Status: AC
  Filled 2019-09-03: qty 2

## 2019-09-03 NOTE — Progress Notes (Signed)
PROGRESS NOTE    [name]  OEV:[id number] DOB: August 18, 1970 DOA: 09/02/2019 PCP: [name], MD  Assessment & Plan:   Principal Problem:   Hypoglycemia Active Problems:   Depression with anxiety   Essential hypertension   Stroke [facility])   Hypothyroidism   CAD (coronary artery disease)   Chronic kidney disease, stage 3a   Hypoglycemia: with recurrent hypoglycemia, has hx of DM2 but has HbA1c 4.8 and has been off of diabetes medications for at least 2 months. Not eating or drinking much due to pain with swallowing. Insulin, proinsulin, c-peptide, sulfonylurea panel are all pending. Beta-hydraoxybutyric acid is WNL. Cortisol level is WNL. Continue dextrose 10% infusion for now, encourage her to eat and drink  Odynophagia: several weeks of pain with swallowing liquids and solids, and reports recent EGD that was unremarkable as per pt's report. Scheduled outpatient to see ENT for a second time   Peripheral neuropathy: continue on home dose of gabapentin   HTN: will continue on propranolol, losartan, amlodipine, imdur   CAD: continue on aspirin, propranolol, losartan. Stable    CKDIIIa: Cr is trending up slightly today. Will continue to monitor   Hx of CVA: continue on aspirin   Hypothyroidism: continue on synthroid  Depression: severity unknown. Will continue on bupropion, venlafaxine     DVT prophylaxis: lovenox Code Status: full  Family Communication:  Disposition Plan: depends on PT/OT recs   Consultants:      Procedures:   Antimicrobials:    Subjective: Pt c/o nausea  Objective: Vitals:   09/02/19 2042 09/03/19 0013 09/03/19 0100 09/03/19 0500  BP: (!) 193/76 (!) 165/85 (!) 166/99 (!) 187/83  Pulse: 53 58 67 60  Resp: (!) 10 (!) 11 12 12   Temp: 97.9 F (36.6 C)  98.4 F (36.9 C) 98.6 F (37 C)  TempSrc: Oral  Oral Oral  SpO2: 99% 100% 100%   Weight:      Height:        Intake/Output Summary (Last 24 hours) at 09/03/2019  0743 Last data filed at 09/03/2019 0600 Gross per 24 hour  Intake 1215.48 ml  Output --  Net 1215.48 ml   Filed Weights   09/02/19 1855  Weight: 65.8 kg    Examination:  General exam: Appears calm but uncomfortable  Respiratory system: Clear to auscultation. Respiratory effort normal. No rales Cardiovascular system: S1 & S2 +. No rubs, gallops or clicks.. Gastrointestinal system: Abdomen is nondistended, soft and nontender. Hyperactive bowel sounds heard. Central nervous system: Alert and oriented. Moves all 4 extremities  Psychiatry: Judgement and insight appear normal. Flat mood and affect.     Data Reviewed: I have personally reviewed following labs and imaging studies  CBC: Recent Labs  Lab 09/01/19 0817 09/02/19 1905  WBC 12.5* 12.4*  HGB 12.2 13.7  HCT 37.0 41.2  MCV 84.1 83.7  PLT 270 829   Basic Metabolic Panel: Recent Labs  Lab 09/01/19 0817 09/02/19 1905  NA 139 142  K 3.5 3.9  CL 114* 111  CO2 18* 24  GLUCOSE 65* 62*  BUN 30* 22*  CREATININE 1.34* 1.39*  CALCIUM 8.2* 8.5*   GFR: Estimated Creatinine Clearance: 43.1 mL/min (A) (by C-G formula based on SCr of 1.39 mg/dL (H)). Liver Function Tests: Recent Labs  Lab 09/03/19 0513  AST 61*  ALT 61*  ALKPHOS 125  BILITOT 0.6  PROT 6.8  ALBUMIN 3.5   No results for input(s): LIPASE, AMYLASE in the last 168 hours. No results  for input(s): AMMONIA in the last 168 hours. Coagulation Profile: No results for input(s): INR, PROTIME in the last 168 hours. Cardiac Enzymes: No results for input(s): CKTOTAL, CKMB, CKMBINDEX, TROPONINI in the last 168 hours. BNP (last 3 results) No results for input(s): PROBNP in the last 8760 hours. HbA1C: No results for input(s): HGBA1C in the last 72 hours. CBG: Recent Labs  Lab 09/02/19 2320 09/03/19 0122 09/03/19 0200 09/03/19 0517 09/03/19 0607  GLUCAP 98 36* 78 50* 152*   Lipid Profile: No results for input(s): CHOL, HDL, LDLCALC, TRIG, CHOLHDL,  LDLDIRECT in the last 72 hours. Thyroid Function Tests: No results for input(s): TSH, T4TOTAL, FREET4, T3FREE, THYROIDAB in the last 72 hours. Anemia Panel: No results for input(s): VITAMINB12, FOLATE, FERRITIN, TIBC, IRON, RETICCTPCT in the last 72 hours. Sepsis Labs: No results for input(s): PROCALCITON, LATICACIDVEN in the last 168 hours.  Recent Results (from the past 240 hour(s))  SARS Coronavirus 2 by RT PCR (hospital order, performed in [facility] hospital lab) Nasopharyngeal Nasopharyngeal Swab     Status: None   Collection Time: 09/01/19  3:27 PM   Specimen: Nasopharyngeal Swab  Result Value Ref Range Status   SARS Coronavirus 2 NEGATIVE NEGATIVE Final    Comment: (NOTE) SARS-CoV-2 target nucleic acids are NOT DETECTED.  The SARS-CoV-2 RNA is generally detectable in upper and lower respiratory specimens during the acute phase of infection. The lowest concentration of SARS-CoV-2 viral copies this assay can detect is 250 copies / mL. A negative result does not preclude SARS-CoV-2 infection and should not be used as the sole basis for treatment or other patient management decisions.  A negative result may occur with improper specimen collection / handling, submission of specimen other than nasopharyngeal swab, presence of viral mutation(s) within the areas targeted by this assay, and inadequate number of viral copies (<250 copies / mL). A negative result must be combined with clinical observations, patient history, and epidemiological information.  Fact Sheet for Patients:   StrictlyIdeas.no  Fact Sheet for Healthcare Providers: BankingDealers.co.za  This test is not yet approved or  cleared by the [place] FDA and has been authorized for detection and/or diagnosis of SARS-CoV-2 by FDA under an Emergency Use Authorization (EUA).  This EUA will remain in effect (meaning this test can be used) for the duration of  the COVID-19 declaration under Section 564(b)(1) of the Act, 21 U.S.C. section 360bbb-3(b)(1), unless the authorization is terminated or revoked sooner.  Performed at [facility], [address] [place], [place] [address]          Radiology Studies: DG Chest 2 View  Result Date: 09/01/2019 CLINICAL DATA:  Chest pain EXAM: CHEST - 2 VIEW COMPARISON:  April 18, 2019 FINDINGS: Lungs are clear. Heart size and pulmonary vascularity are normal. Right-sided aortic arch noted. Postoperative change in the upper left paratracheal region noted. No adenopathy. No bone lesions. IMPRESSION: Right-sided aortic arch.  Heart size normal.  Lungs clear. Electronically Signed   By: Lowella Grip III M.D.   On: 09/01/2019 08:45        Scheduled Meds:  amLODipine  5 mg Oral Daily   aspirin EC  81 mg Oral Daily   buPROPion  300 mg Oral Daily   enoxaparin (LOVENOX) injection  40 mg Subcutaneous Q24H   gabapentin  300 mg Oral BID   isosorbide mononitrate  60 mg Oral Daily   levothyroxine  25 mcg Oral QAC breakfast   losartan  25 mg Oral Daily  pantoprazole  40 mg Oral BID   propranolol ER  60 mg Oral QHS   sodium chloride flush  3 mL Intravenous Once   sodium chloride flush  3 mL Intravenous Q12H   venlafaxine XR  150 mg Oral Q breakfast   Continuous Infusions:   LOS: 0 days    Time spent: 34 mins    Wyvonnia Dusky, MD [facility] Pager [phone]  If 7PM-7AM, please contact night-coverage [email or web address] 09/03/2019, 7:43 AM

## 2019-09-03 NOTE — Progress Notes (Signed)
Pt with continued symptomatic hypoglycemia, nauseous, diaphoretic at times, reports feeling "tired" or "wiped out" pt glucose check 41; pt unable to consume oral juice or glucose tab secondary to nausea. Amp d50 administered as per protocol.

## 2019-09-04 DIAGNOSIS — R1319 Other dysphagia: Secondary | ICD-10-CM

## 2019-09-04 LAB — URINE CULTURE

## 2019-09-04 LAB — GLUCOSE, CAPILLARY
Glucose-Capillary: 103 mg/dL — ABNORMAL HIGH (ref 70–99)
Glucose-Capillary: 107 mg/dL — ABNORMAL HIGH (ref 70–99)
Glucose-Capillary: 133 mg/dL — ABNORMAL HIGH (ref 70–99)
Glucose-Capillary: 159 mg/dL — ABNORMAL HIGH (ref 70–99)
Glucose-Capillary: 165 mg/dL — ABNORMAL HIGH (ref 70–99)
Glucose-Capillary: 262 mg/dL — ABNORMAL HIGH (ref 70–99)
Glucose-Capillary: 62 mg/dL — ABNORMAL LOW (ref 70–99)
Glucose-Capillary: 67 mg/dL — ABNORMAL LOW (ref 70–99)
Glucose-Capillary: 72 mg/dL (ref 70–99)
Glucose-Capillary: 73 mg/dL (ref 70–99)
Glucose-Capillary: 86 mg/dL (ref 70–99)

## 2019-09-04 LAB — TSH: TSH: 3.621 u[IU]/mL (ref 0.350–4.500)

## 2019-09-04 LAB — BASIC METABOLIC PANEL
Anion gap: 5 (ref 5–15)
BUN: 12 mg/dL (ref 6–20)
CO2: 24 mmol/L (ref 22–32)
Calcium: 8 mg/dL — ABNORMAL LOW (ref 8.9–10.3)
Chloride: 102 mmol/L (ref 98–111)
Creatinine, Ser: 1.15 mg/dL — ABNORMAL HIGH (ref 0.44–1.00)
GFR calc Af Amer: >60 mL/min (ref >60)
GFR calc non Af Amer: 55 mL/min — ABNORMAL LOW (ref >60)
Glucose, Bld: 85 mg/dL (ref 70–99)
Potassium: 3.4 mmol/L — ABNORMAL LOW (ref 3.5–5.1)
Sodium: 131 mmol/L — ABNORMAL LOW (ref 135–145)

## 2019-09-04 LAB — CBC
HCT: 36 % (ref 36.0–46.0)
Hemoglobin: 12 g/dL (ref 12.0–15.0)
MCH: 27.7 pg (ref 26.0–34.0)
MCHC: 33.3 g/dL (ref 30.0–36.0)
MCV: 83.1 fL (ref 80.0–100.0)
Platelets: 215 10*3/uL (ref 150–400)
RBC: 4.33 MIL/uL (ref 3.87–5.11)
RDW: 14.2 % (ref 11.5–15.5)
WBC: 12.6 10*3/uL — ABNORMAL HIGH (ref 4.0–10.5)
nRBC: 0 % (ref 0.0–0.2)

## 2019-09-04 LAB — INSULIN AND C-PEPTIDE, SERUM
C-Peptide: 17.7 ng/mL — ABNORMAL HIGH (ref 1.1–4.4)
Insulin: 96.5 u[IU]/mL — ABNORMAL HIGH (ref 2.6–24.9)

## 2019-09-04 MED ORDER — POTASSIUM CHLORIDE 10 MEQ/100ML IV SOLN
10.0000 meq | INTRAVENOUS | Status: AC
  Administered 2019-09-04 (×2): 10 meq via INTRAVENOUS
  Filled 2019-09-04 (×2): qty 100

## 2019-09-04 MED ORDER — POTASSIUM CHLORIDE 10 MEQ/50ML IV SOLN: 10.0000 meq | INTRAVENOUS | Status: DC

## 2019-09-04 MED ORDER — CLOPIDOGREL BISULFATE 75 MG PO TABS
75.0000 mg | ORAL_TABLET | Freq: Every day | ORAL | Status: DC
  Administered 2019-09-04 – 2019-09-12 (×9): 75 mg via ORAL
  Filled 2019-09-04 (×9): qty 1

## 2019-09-04 NOTE — Progress Notes (Signed)
Inpatient Diabetes Program Recommendations  AACE/ADA: New Consensus Statement on Inpatient Glycemic Control   Target Ranges:  Prepandial:   less than 140 mg/dL      Peak postprandial:   less than 180 mg/dL (1-2 hours)      Critically ill patients:  140 - 180 mg/dL  Results for [name], [name] (MRN [id number]) as of 09/04/2019 08:03  Ref. Range 09/04/2019 00:07 09/04/2019 02:09 09/04/2019 04:30 09/04/2019 06:00 09/04/2019 08:00  Glucose-Capillary Latest Ref Range: 70 - 99 mg/dL 262 (H) 67 (L) 86 72 73   Results for [name], [name] (MRN [id number]) as of 09/04/2019 08:03  Ref. Range 09/03/2019 06:07 09/03/2019 08:34 09/03/2019 08:59 09/03/2019 09:14 09/03/2019 09:45 09/03/2019 10:45 09/03/2019 11:02 09/03/2019 12:12 09/03/2019 13:05 09/03/2019 13:48 09/03/2019 15:02 09/03/2019 17:17 09/03/2019 19:07 09/03/2019 20:02 09/03/2019 21:47  Glucose-Capillary Latest Ref Range: 70 - 99 mg/dL 152 (H) 57 (L) 63 (L) 81 104 (H) 41 (LL) 170 (H) 99 59 (L) 105 (H) 168 (H) 169 (H) 89 75 84  Results for [name], [name] (MRN [id number]) as of 09/04/2019 08:03  Ref. Range 04/19/2019 04:08 09/03/2019 05:13  Hemoglobin A1C Latest Ref Range: 4.8 - 5.6 % 5.1 4.8   Review of Glycemic Control  Diabetes history: DM2 hx Outpatient Diabetes medications: None Current orders for Inpatient glycemic control: CBGs Q2H; D10 @ 125 ml/hr  Inpatient Diabetes Program Recommendations:   Hypoglycemia: Noted patient experiencing recurrent hypoglycemia and patient reports not eating or drinking due to pain with swallowing. Per chart, patient sees Dr. Honor Junes and Renae Gloss, NP [facility]) and last office note was on 06/20/19 with Renae Gloss, NP. Attending MD may want to reach out to Dr. Honor Junes to discuss recurrent hypoglycemia and recommendations Endocrinology may be able to provide.  Thanks, Barnie Alderman, RN, MSN, CDE Diabetes Coordinator Inpatient Diabetes Program [phone] (Team Pager from 8am to 5pm)

## 2019-09-04 NOTE — Telephone Encounter (Signed)
She is currently admitted to the hospital with low sugar My understanding is she should be on Plavix with aspirin given history as of stroke, coronary disease, chronic stable angina and stent  Unclear where the Plavix was held Possibly for vaginal bleeding in April From a cardiac perspective Plavix could be restarted

## 2019-09-04 NOTE — Telephone Encounter (Signed)
Dr [name] routed message to Dr Eppie Gibson, who is the hospital caring for patient, with his advise.  Closing this encounter.

## 2019-09-04 NOTE — Progress Notes (Signed)
PROGRESS NOTE    [name]  INO:[id number] DOB: December 03, 1970 DOA: 09/02/2019 PCP: [name], MD  Assessment & Plan:   Principal Problem:   Hypoglycemia Active Problems:   Depression with anxiety   Essential hypertension   Stroke [facility])   Hypothyroidism   CAD (coronary artery disease)   Chronic kidney disease, stage 3a   Hypoglycemia: with recurrent hypoglycemia, has hx of DM2 but has HbA1c 4.8 and has been off of diabetes medications for at least 2 months. Not eating or drinking much due to pain with swallowing. Insulin, proinsulin, c-peptide, sulfonylurea panel are all pending. Beta-hydraoxybutyric acid is WNL. Cortisol level is WNL. Continue dextrose 10% infusion. Eating and drinking a little bit today   Odynophagia: several weeks of pain with swallowing liquids and solids, and reports recent EGD that was unremarkable as per pt's report. Scheduled outpatient to see ENT for a second time   Hypokalemia: KCl repleted. Will continue to monitor   Peripheral neuropathy: continue on home dose of gabapentin   HTN: will continue on beta blocker, ACE-I , amlodipine, imdur   CAD: continue on aspirin, beta blocker, ACE-I. Stable    CKDIIIa: Cr is trending down today. Avoid nephrotoxic meds.   Hx of CVA: continue on aspirin   Hypothyroidism: continue on levothyroxine   Depression: severity unknown. Will continue on bupropion, venlafaxine     DVT prophylaxis: lovenox Code Status: full  Family Communication: discussed pt's care w/ pt's sister, Marjorie Smolder, answered her questions Disposition Plan: depends on PT/OT recs   Consultants:      Procedures:   Antimicrobials:    Subjective: Pt c/o malaise  Objective: Vitals:   09/03/19 2143 09/04/19 0000 09/04/19 0700 09/04/19 0800  BP: (!) 162/79 (!) 140/78 (!) 133/80 116/69  Pulse: 74 50 50 64  Resp:  (!) 11 18 12   Temp:  98 F (36.7 C)  97.9 F (36.6 C)  TempSrc:  Axillary  Oral  SpO2:  100% 100%  100%  Weight:      Height:        Intake/Output Summary (Last 24 hours) at 09/04/2019 0830 Last data filed at 09/04/2019 0800 Gross per 24 hour  Intake 2187.67 ml  Output --  Net 2187.67 ml   Filed Weights   09/02/19 1855 09/03/19 1252  Weight: 65.8 kg 67 kg    Examination:   General exam: Appears calm but uncomfortable  Respiratory system: Clear to auscultation. Respiratory effort normal. No wheezes Cardiovascular system: S1 & S2 heard. No rubs, gallops or clicks.. Gastrointestinal system: Abdomen is nondistended, soft and nontender. Normal bowel sounds heard. Central nervous system: Alert and oriented. Moves all 4 extremities  Psychiatry: Judgement and insight appear normal. Flat mood and affect.   Data Reviewed: I have personally reviewed following labs and imaging studies  CBC: Recent Labs  Lab 09/01/19 0817 09/02/19 1905 09/04/19 0459  WBC 12.5* 12.4* 12.6*  HGB 12.2 13.7 12.0  HCT 37.0 41.2 36.0  MCV 84.1 83.7 83.1  PLT 270 234 096   Basic Metabolic Panel: Recent Labs  Lab 09/01/19 0817 09/02/19 1905 09/04/19 0459  NA 139 142 131*  K 3.5 3.9 3.4*  CL 114* 111 102  CO2 18* 24 24  GLUCOSE 65* 62* 85  BUN 30* 22* 12  CREATININE 1.34* 1.39* 1.15*  CALCIUM 8.2* 8.5* 8.0*   GFR: Estimated Creatinine Clearance: 52.6 mL/min (A) (by C-G formula based on SCr of 1.15 mg/dL (H)). Liver Function Tests: Recent Labs  Lab  09/03/19 0513  AST 61*  ALT 61*  ALKPHOS 125  BILITOT 0.6  PROT 6.8  ALBUMIN 3.5   No results for input(s): LIPASE, AMYLASE in the last 168 hours. No results for input(s): AMMONIA in the last 168 hours. Coagulation Profile: No results for input(s): INR, PROTIME in the last 168 hours. Cardiac Enzymes: No results for input(s): CKTOTAL, CKMB, CKMBINDEX, TROPONINI in the last 168 hours. BNP (last 3 results) No results for input(s): PROBNP in the last 8760 hours. HbA1C: Recent Labs    09/03/19 0513  HGBA1C 4.8   CBG: Recent Labs   Lab 09/04/19 0007 09/04/19 0209 09/04/19 0430 09/04/19 0600 09/04/19 0800  GLUCAP 262* 67* 86 72 73   Lipid Profile: No results for input(s): CHOL, HDL, LDLCALC, TRIG, CHOLHDL, LDLDIRECT in the last 72 hours. Thyroid Function Tests: Recent Labs    09/04/19 0459  TSH 3.621   Anemia Panel: No results for input(s): VITAMINB12, FOLATE, FERRITIN, TIBC, IRON, RETICCTPCT in the last 72 hours. Sepsis Labs: No results for input(s): PROCALCITON, LATICACIDVEN in the last 168 hours.  Recent Results (from the past 240 hour(s))  SARS Coronavirus 2 by RT PCR (hospital order, performed in [facility] hospital lab) Nasopharyngeal Nasopharyngeal Swab     Status: None   Collection Time: 09/01/19  3:27 PM   Specimen: Nasopharyngeal Swab  Result Value Ref Range Status   SARS Coronavirus 2 NEGATIVE NEGATIVE Final    Comment: (NOTE) SARS-CoV-2 target nucleic acids are NOT DETECTED.  The SARS-CoV-2 RNA is generally detectable in upper and lower respiratory specimens during the acute phase of infection. The lowest concentration of SARS-CoV-2 viral copies this assay can detect is 250 copies / mL. A negative result does not preclude SARS-CoV-2 infection and should not be used as the sole basis for treatment or other patient management decisions.  A negative result may occur with improper specimen collection / handling, submission of specimen other than nasopharyngeal swab, presence of viral mutation(s) within the areas targeted by this assay, and inadequate number of viral copies (<250 copies / mL). A negative result must be combined with clinical observations, patient history, and epidemiological information.  Fact Sheet for Patients:   StrictlyIdeas.no  Fact Sheet for Healthcare Providers: BankingDealers.co.za  This test is not yet approved or  cleared by the [place] FDA and has been authorized for detection and/or diagnosis of SARS-CoV-2  by FDA under an Emergency Use Authorization (EUA).  This EUA will remain in effect (meaning this test can be used) for the duration of the COVID-19 declaration under Section 564(b)(1) of the Act, 21 U.S.C. section 360bbb-3(b)(1), unless the authorization is terminated or revoked sooner.  Performed at [facility], [address] [place], [place] [address]   MRSA PCR Screening     Status: Abnormal   Collection Time: 09/03/19 12:51 PM   Specimen: Nasopharyngeal  Result Value Ref Range Status   MRSA by PCR POSITIVE (A) NEGATIVE Final    Comment:        The GeneXpert MRSA Assay (FDA approved for NASAL specimens only), is one component of a comprehensive MRSA colonization surveillance program. It is not intended to diagnose MRSA infection nor to guide or monitor treatment for MRSA infections. RESULT CALLED TO, READ BACK BY AND VERIFIED WITH: Talbot Grumbling RN AT 0175 ON 09/03/19 [facility] Performed at [facility], [address], [place], [place] [address]          Radiology Studies: No results found.  Scheduled Meds: . amLODipine  5 mg Oral Daily  . aspirin EC  81 mg Oral Daily  . buPROPion  300 mg Oral Daily  . Chlorhexidine Gluconate Cloth  6 each Topical Daily  . Chlorhexidine Gluconate Cloth  6 each Topical Q0600  . enoxaparin (LOVENOX) injection  40 mg Subcutaneous Q24H  . gabapentin  300 mg Oral BID  . isosorbide mononitrate  60 mg Oral Daily  . levothyroxine  25 mcg Oral QAC breakfast  . losartan  25 mg Oral Daily  . mupirocin ointment  1 application Nasal BID  . pantoprazole  40 mg Oral BID  . propranolol ER  60 mg Oral QHS  . scopolamine  1 patch Transdermal Q72H  . sodium chloride flush  3 mL Intravenous Once  . sodium chloride flush  3 mL Intravenous Q12H  . venlafaxine XR  150 mg Oral Q breakfast   Continuous Infusions: . dextrose 125 mL/hr at 09/04/19 0800     LOS: 1 day    Time spent: 31 mins    Wyvonnia Dusky,  MD [facility] Pager [phone]  If 7PM-7AM, please contact night-coverage [email or web address] 09/04/2019, 8:30 AM

## 2019-09-04 NOTE — Progress Notes (Signed)
Pt resting in bed quietly. No s/s of distress noted. CBG has been stable throughout shift. D10 infusion decreased to 53ml/hr, CBG checks changed to Q4H. Pt alert and oriented. Son has visited today.

## 2019-09-05 ENCOUNTER — Inpatient Hospital Stay: Payer: Medicare Other

## 2019-09-05 LAB — GLUCOSE, CAPILLARY
Glucose-Capillary: 105 mg/dL — ABNORMAL HIGH (ref 70–99)
Glucose-Capillary: 106 mg/dL — ABNORMAL HIGH (ref 70–99)
Glucose-Capillary: 107 mg/dL — ABNORMAL HIGH (ref 70–99)
Glucose-Capillary: 108 mg/dL — ABNORMAL HIGH (ref 70–99)
Glucose-Capillary: 108 mg/dL — ABNORMAL HIGH (ref 70–99)
Glucose-Capillary: 123 mg/dL — ABNORMAL HIGH (ref 70–99)
Glucose-Capillary: 123 mg/dL — ABNORMAL HIGH (ref 70–99)
Glucose-Capillary: 86 mg/dL (ref 70–99)
Glucose-Capillary: 95 mg/dL (ref 70–99)
Glucose-Capillary: 95 mg/dL (ref 70–99)

## 2019-09-05 LAB — CBC
HCT: 34.9 % — ABNORMAL LOW (ref 36.0–46.0)
Hemoglobin: 12.2 g/dL (ref 12.0–15.0)
MCH: 28.4 pg (ref 26.0–34.0)
MCHC: 35 g/dL (ref 30.0–36.0)
MCV: 81.2 fL (ref 80.0–100.0)
Platelets: 163 10*3/uL (ref 150–400)
RBC: 4.3 MIL/uL (ref 3.87–5.11)
RDW: 13.7 % (ref 11.5–15.5)
WBC: 9.1 10*3/uL (ref 4.0–10.5)
nRBC: 0 % (ref 0.0–0.2)

## 2019-09-05 LAB — BASIC METABOLIC PANEL
Anion gap: 7 (ref 5–15)
BUN: 11 mg/dL (ref 6–20)
CO2: 23 mmol/L (ref 22–32)
Calcium: 8.3 mg/dL — ABNORMAL LOW (ref 8.9–10.3)
Chloride: 101 mmol/L (ref 98–111)
Creatinine, Ser: 1.13 mg/dL — ABNORMAL HIGH (ref 0.44–1.00)
GFR calc Af Amer: >60 mL/min (ref >60)
GFR calc non Af Amer: 57 mL/min — ABNORMAL LOW (ref >60)
Glucose, Bld: 95 mg/dL (ref 70–99)
Potassium: 3.9 mmol/L (ref 3.5–5.1)
Sodium: 131 mmol/L — ABNORMAL LOW (ref 135–145)

## 2019-09-05 MED ORDER — GADOBUTROL 1 MMOL/ML IV SOLN
6.0000 mL | Freq: Once | INTRAVENOUS | Status: AC | PRN
  Administered 2019-09-05: 22:00:00 6 mL via INTRAVENOUS

## 2019-09-05 NOTE — Progress Notes (Signed)
PROGRESS NOTE    [name]  TKP:[id number] DOB: 03/03/1970 DOA: 09/02/2019 PCP: [name], MD  Assessment & Plan:   Principal Problem:   Hypoglycemia Active Problems:   Depression with anxiety   Essential hypertension   Stroke [facility])   Hypothyroidism   CAD (coronary artery disease)   Chronic kidney disease, stage 3a   Hypoglycemia: with recurrent hypoglycemia, w/ hx of DM2 but has HbA1c 4.8 and has been off of diabetes medications for at least 2 months. Not eating or drinking much due to pain with swallowing recently but eating/drinking more today. Proinsulin,sulfonylurea panel are all pending. Insulin & c-peptide levels are both elevated. Beta-hydraoxybutyric acid is WNL. Cortisol level is WNL. Continue dextrose 10% infusion. MRI pancreas protocol ordered to assess for insulinoma after discussing w/ rads.   Odynophagia: several weeks of pain with swallowing liquids and solids, and reports recent EGD that was unremarkable as per pt's report. Improving, eating/drinking more today. Scheduled outpatient to see ENT for a second time   Early dementia: was dx outpatient w/ neuro as per pt's sister Marjorie Smolder. Re-orient prn   Hypokalemia: WNL today. Will continue to monitor   Peripheral neuropathy: continue on home dose of gabapentin   HTN: will continue on propranolol, losartan , amlodipine, imdur   CAD: continue on aspirin, propranolol, losartan. Stable    CKDIIIa: Cr is stable. Avoid nephrotoxic meds.   Hx of CVA: continue on aspirin   Hypothyroidism: continue on synthroid  Depression: severity unknown. Will continue on bupropion, venlafaxine     DVT prophylaxis: lovenox Code Status: full  Family Communication:  Disposition Plan: depends on PT/OT recs   Consultants:      Procedures:   Antimicrobials:    Subjective: Pt c/o fatigue  Objective: Vitals:   09/05/19 0700 09/05/19 0701 09/05/19 1432 09/05/19 1514  BP:  137/87 131/82 (!)  165/76  Pulse: (!) 56  67 (!) 59  Resp: 15 16 16 17   Temp:   98.6 F (37 C) 98.3 F (36.8 C)  TempSrc:   Oral Oral  SpO2: 100% 100% 98% 100%  Weight:      Height:    5\' 2"  (1.575 m)    Intake/Output Summary (Last 24 hours) at 09/05/2019 1517 Last data filed at 09/05/2019 1107 Gross per 24 hour  Intake 2517.5 ml  Output 900 ml  Net 1617.5 ml   Filed Weights   09/02/19 1855 09/03/19 1252  Weight: 65.8 kg 67 kg    Examination: General exam: Appears calm and comfortable Respiratory system: Clear to auscultation. Respiratory effort normal. No wheezes Cardiovascular system: S1 & S2 +. No rubs, gallops or clicks.. Gastrointestinal system: Abdomen is nondistended, soft and nontender. Normal bowel sounds heard. Central nervous system: Alert and oriented. Moves all 4 extremities  Psychiatry: Judgement and insight appear normal. Flat mood and affect.    Data Reviewed: I have personally reviewed following labs and imaging studies  CBC: Recent Labs  Lab 09/01/19 0817 09/02/19 1905 09/04/19 0459 09/05/19 0610  WBC 12.5* 12.4* 12.6* 9.1  HGB 12.2 13.7 12.0 12.2  HCT 37.0 41.2 36.0 34.9*  MCV 84.1 83.7 83.1 81.2  PLT 270 234 215 517   Basic Metabolic Panel: Recent Labs  Lab 09/01/19 0817 09/02/19 1905 09/04/19 0459 09/05/19 0610  NA 139 142 131* 131*  K 3.5 3.9 3.4* 3.9  CL 114* 111 102 101  CO2 18* 24 24 23   GLUCOSE 65* 62* 85 95  BUN 30* 22* 12 11  CREATININE 1.34* 1.39* 1.15* 1.13*  CALCIUM 8.2* 8.5* 8.0* 8.3*   GFR: Estimated Creatinine Clearance: 53.5 mL/min (A) (by C-G formula based on SCr of 1.13 mg/dL (H)). Liver Function Tests: Recent Labs  Lab 09/03/19 0513  AST 61*  ALT 61*  ALKPHOS 125  BILITOT 0.6  PROT 6.8  ALBUMIN 3.5   No results for input(s): LIPASE, AMYLASE in the last 168 hours. No results for input(s): AMMONIA in the last 168 hours. Coagulation Profile: No results for input(s): INR, PROTIME in the last 168 hours. Cardiac Enzymes: No  results for input(s): CKTOTAL, CKMB, CKMBINDEX, TROPONINI in the last 168 hours. BNP (last 3 results) No results for input(s): PROBNP in the last 8760 hours. HbA1C: Recent Labs    09/03/19 0513  HGBA1C 4.8   CBG: Recent Labs  Lab 09/04/19 2344 09/05/19 0032 09/05/19 0354 09/05/19 0659 09/05/19 1117  GLUCAP 62* 123*  123* 108*  108* 95  95 106*   Lipid Profile: No results for input(s): CHOL, HDL, LDLCALC, TRIG, CHOLHDL, LDLDIRECT in the last 72 hours. Thyroid Function Tests: Recent Labs    09/04/19 0459  TSH 3.621   Anemia Panel: No results for input(s): VITAMINB12, FOLATE, FERRITIN, TIBC, IRON, RETICCTPCT in the last 72 hours. Sepsis Labs: No results for input(s): PROCALCITON, LATICACIDVEN in the last 168 hours.  Recent Results (from the past 240 hour(s))  SARS Coronavirus 2 by RT PCR (hospital order, performed in [facility] hospital lab) Nasopharyngeal Nasopharyngeal Swab     Status: None   Collection Time: 09/01/19  3:27 PM   Specimen: Nasopharyngeal Swab  Result Value Ref Range Status   SARS Coronavirus 2 NEGATIVE NEGATIVE Final    Comment: (NOTE) SARS-CoV-2 target nucleic acids are NOT DETECTED.  The SARS-CoV-2 RNA is generally detectable in upper and lower respiratory specimens during the acute phase of infection. The lowest concentration of SARS-CoV-2 viral copies this assay can detect is 250 copies / mL. A negative result does not preclude SARS-CoV-2 infection and should not be used as the sole basis for treatment or other patient management decisions.  A negative result may occur with improper specimen collection / handling, submission of specimen other than nasopharyngeal swab, presence of viral mutation(s) within the areas targeted by this assay, and inadequate number of viral copies (<250 copies / mL). A negative result must be combined with clinical observations, patient history, and epidemiological information.  Fact Sheet for Patients:     StrictlyIdeas.no  Fact Sheet for Healthcare Providers: BankingDealers.co.za  This test is not yet approved or  cleared by the [place] FDA and has been authorized for detection and/or diagnosis of SARS-CoV-2 by FDA under an Emergency Use Authorization (EUA).  This EUA will remain in effect (meaning this test can be used) for the duration of the COVID-19 declaration under Section 564(b)(1) of the Act, 21 U.S.C. section 360bbb-3(b)(1), unless the authorization is terminated or revoked sooner.  Performed at [facility], [address], [place], [place] [address]   Urine Culture     Status: Abnormal   Collection Time: 09/02/19  7:27 PM   Specimen: Urine, Random  Result Value Ref Range Status   Specimen Description   Final    URINE, RANDOM Performed at [facility], [address], [place], [place] [address]    Special Requests   Final    NONE Performed at [facility], [address] [name], [place] [address]    Culture MULTIPLE SPECIES PRESENT, SUGGEST RECOLLECTION (A)  Final   Report Status 09/04/2019 FINAL  Final  MRSA PCR Screening     Status: Abnormal   Collection Time: 09/03/19 12:51 PM   Specimen: Nasopharyngeal  Result Value Ref Range Status   MRSA by PCR POSITIVE (A) NEGATIVE Final    Comment:        The GeneXpert MRSA Assay (FDA approved for NASAL specimens only), is one component of a comprehensive MRSA colonization surveillance program. It is not intended to diagnose MRSA infection nor to guide or monitor treatment for MRSA infections. RESULT CALLED TO, READ BACK BY AND VERIFIED WITH: Talbot Grumbling RN AT 0626 ON 09/03/19 [facility] Performed at [facility], [address], [place], [place] [address]          Radiology Studies: No results found.      Scheduled Meds: . amLODipine  5 mg Oral Daily  . aspirin EC  81 mg Oral Daily  . buPROPion  300 mg Oral  Daily  . Chlorhexidine Gluconate Cloth  6 each Topical Daily  . Chlorhexidine Gluconate Cloth  6 each Topical Q0600  . clopidogrel  75 mg Oral Daily  . enoxaparin (LOVENOX) injection  40 mg Subcutaneous Q24H  . gabapentin  300 mg Oral BID  . isosorbide mononitrate  60 mg Oral Daily  . levothyroxine  25 mcg Oral QAC breakfast  . losartan  25 mg Oral Daily  . mupirocin ointment  1 application Nasal BID  . pantoprazole  40 mg Oral BID  . propranolol ER  60 mg Oral QHS  . scopolamine  1 patch Transdermal Q72H  . sodium chloride flush  3 mL Intravenous Once  . sodium chloride flush  3 mL Intravenous Q12H  . venlafaxine XR  150 mg Oral Q breakfast   Continuous Infusions: . dextrose 125 mL/hr at 09/05/19 1107     LOS: 2 days    Time spent: 33 mins    Wyvonnia Dusky, MD [facility] Pager [phone]  If 7PM-7AM, please contact night-coverage [email or web address] 09/05/2019, 3:17 PM

## 2019-09-05 NOTE — Progress Notes (Signed)
Pt transferred to RM #[id number] at this time. VSS. Report given to Gerald Stabs, [place].

## 2019-09-05 NOTE — TOC Initial Note (Signed)
Transition of Care [facility]) - Initial/Assessment Note    Patient Details  Name: [name] MRN: [id number] Date of Birth: August 22, 1970  Transition of Care [facility]) CM/SW Contact:    Shelbie Ammons, RN Phone Number: 09/05/2019, 2:45 PM  Clinical Narrative:   RNCM spoke with patient by phone. Patient lives at home with her son and was admitted to hospital due to low blood sugar. She reports that she still drives and is able to take herself back and forth to appointments. She gets her medications from [facility] on [address] and reports that "for now" she can afford all of her medications. Patient reports that she has had [place] in the past and has a walker and a cane in the home but she typically just uses the cane around her house. No other needs identified at this time but RNCM will remain available for any further needs.                  Patient Goals and CMS Choice        Expected Discharge Plan and Services                                                Prior Living Arrangements/Services                       Activities of Daily Living Home Assistive Devices/Equipment: None ADL Screening (condition at time of admission) Patient's cognitive ability adequate to safely complete daily activities?: Yes Is the patient deaf or have difficulty hearing?: Yes Does the patient have difficulty seeing, even when wearing glasses/contacts?: No Does the patient have difficulty concentrating, remembering, or making decisions?: No Patient able to express need for assistance with ADLs?: Yes Does the patient have difficulty dressing or bathing?: No Independently performs ADLs?: Yes (appropriate for developmental age) Does the patient have difficulty walking or climbing stairs?: Yes Weakness of Legs: Both Weakness of Arms/Hands: Both  Permission Sought/Granted                  Emotional Assessment              Admission diagnosis:   Hypoglycemia [E16.2] Urinary tract infection without hematuria, site unspecified [N39.0] Patient Active Problem List   Diagnosis Date Noted  . Chronic kidney disease, stage 3a 09/02/2019  . Hypoglycemia   . Family history of breast cancer 05/23/2019  . Family history of colon cancer 05/23/2019  . Abnormal LFTs 04/18/2019  . Esophageal dysphagia   . Gastrointestinal tract imaging abnormality   . Trochanteric bursitis, left hip 03/17/2019  . Malignant neoplasm of vulva, unspecified ([place]) 03/10/2019  . Peripheral vascular disease, unspecified ([place]) 03/10/2019  . Paroxysmal supraventricular tachycardia ([place]) 03/10/2019  . Contusion of left hip 03/06/2019  . Chest pain, moderate coronary artery risk 01/27/2019  . Melena   . Hypertensive urgency   . Recurrent chest pain 01/19/2019  . Depression, major, single episode, moderate ([employer]) 12/09/2018  . Closed nondisplaced intertrochanteric fracture of left femur ([place]) 10/12/2018  . Hypotension 06/26/2018  . Autonomic neuropathy 03/24/2018  . Acute delirium 03/03/2018  . H/O gastric bypass 03/02/2018  . Hypertension associated with stage 3 chronic kidney disease due to type 2 diabetes mellitus ([place]) 02/20/2018  . SI (sacroiliac) joint dysfunction 12/02/2017  . Effort angina ([place]) 06/29/2017  .  Unstable angina ([place]) 06/24/2017  . Syncope 04/09/2017  . Insomnia 03/18/2017  . Ischemic cardiomyopathy   . Arthritis   . Anxiety   . Tendinitis of upper biceps tendon of right shoulder 03/16/2017  . Hx of colonic polyps   . H/O medication noncompliance 12/14/2015  . CAD (coronary artery disease)   . Hypertensive heart disease   . Type 2 DM with CKD stage 4 and hypertension ([place]) 06/05/2015  . Status post bariatric surgery 06/04/2015  . Carotid stenosis 04/30/2015  . Stable angina pectoris ([place]) 04/17/2015  . Iron deficiency anemia 03/22/2015  . Vitamin B12 deficiency 02/18/2015  . Misuse of medications for pain 02/18/2015  . Major depressive  disorder, recurrent, severe with psychotic features ([place]) 02/15/2015  . Helicobacter pylori infection 11/23/2014  . Hemiparesis, left ([place]) 11/23/2014  . Malignant melanoma ([place]) 08/25/2014  . Chronic systolic CHF (congestive heart failure) ([place])   . Incomplete bladder emptying 07/12/2014  . Hypothyroidism 12/30/2013  . Aberrant subclavian artery 11/17/2013  . Multiple sclerosis ([place]) 11/02/2013  . History of CVA with residual deficit 06/20/2013  . Headache, migraine 05/29/2013  . Hyperlipidemia   . GERD (gastroesophageal reflux disease)   . Neuropathy ([place]) 01/02/2011  . Stroke ([place]) 06/21/2008  . Depression with anxiety 05/01/2008  . Essential hypertension 05/01/2008   PCP:  [name], MD Pharmacy:   [facility] [address] ([address]), [place] [address] [place] ([place]) [place] [address] Phone: [phone] Fax: [place]), [place] [address] [place] ([place]) [place] [address] Phone: [phone] Fax: [phone]     Social Determinants of Health (SDOH) Interventions    Readmission Risk Interventions Readmission Risk Prevention Plan 09/05/2019 06/26/2018 06/16/2018  Transportation Screening Complete Complete Complete  Medication Review Press photographer) Complete Complete Complete  PCP or Specialist appointment within 3-5 days of discharge Complete - Complete  HRI or Home Care Consult - Complete Complete  SW Recovery Care/Counseling Consult Complete - Not Complete  SW Consult Not Complete Comments - - NA  Palliative Care Screening Not Applicable - Not [place] Not Applicable - Not Applicable  Some recent data might be hidden

## 2019-09-06 LAB — GLUCOSE, CAPILLARY
Glucose-Capillary: 162 mg/dL — ABNORMAL HIGH (ref 70–99)
Glucose-Capillary: 59 mg/dL — ABNORMAL LOW (ref 70–99)
Glucose-Capillary: 164 mg/dL — ABNORMAL HIGH (ref 70–99)
Glucose-Capillary: 174 mg/dL — ABNORMAL HIGH (ref 70–99)
Glucose-Capillary: 100 mg/dL — ABNORMAL HIGH (ref 70–99)
Glucose-Capillary: 117 mg/dL — ABNORMAL HIGH (ref 70–99)

## 2019-09-06 LAB — CBC
HCT: 32.7 % — ABNORMAL LOW (ref 36.0–46.0)
Hemoglobin: 11.4 g/dL — ABNORMAL LOW (ref 12.0–15.0)
MCH: 28.1 pg (ref 26.0–34.0)
MCHC: 34.9 g/dL (ref 30.0–36.0)
MCV: 80.5 fL (ref 80.0–100.0)
Platelets: 171 10*3/uL (ref 150–400)
RBC: 4.06 MIL/uL (ref 3.87–5.11)
RDW: 13.8 % (ref 11.5–15.5)
WBC: 7.7 10*3/uL (ref 4.0–10.5)
nRBC: 0 % (ref 0.0–0.2)

## 2019-09-06 LAB — BASIC METABOLIC PANEL
Anion gap: 7 (ref 5–15)
BUN: 12 mg/dL (ref 6–20)
CO2: 25 mmol/L (ref 22–32)
Calcium: 8 mg/dL — ABNORMAL LOW (ref 8.9–10.3)
Chloride: 100 mmol/L (ref 98–111)
Creatinine, Ser: 1.13 mg/dL — ABNORMAL HIGH (ref 0.44–1.00)
GFR calc Af Amer: >60 mL/min (ref >60)
GFR calc non Af Amer: 57 mL/min — ABNORMAL LOW (ref >60)
Glucose, Bld: 109 mg/dL — ABNORMAL HIGH (ref 70–99)
Potassium: 3.6 mmol/L (ref 3.5–5.1)
Sodium: 132 mmol/L — ABNORMAL LOW (ref 135–145)

## 2019-09-06 MED ORDER — DEXTROSE 50 % IV SOLN
INTRAVENOUS | Status: AC
  Administered 2019-09-06: 04:00:00 25 mL
  Filled 2019-09-06: qty 50

## 2019-09-06 NOTE — Progress Notes (Signed)
PROGRESS NOTE  [name] EHU:[id number] DOB: June 01, 1970 DOA: 09/02/2019 PCP: Kendra Sons, MD  HPI/Recap of past 24 hours: HPI from Dr [name] is a 49 y.o. female with medical history significant for history of type 2 diabetes mellitus no longer on medications for this, history of gastric bypass, depression and anxiety, coronary artery disease, history of CVA, and hypothyroidism, now presenting to the emergency department with recurrent hypoglycemia.  Patient reports suffering from pain with swallowing for several weeks now, has discomfort swallowing both liquids and solids but denies any choking, cough, or shortness of breath.  She denies any fevers or chills.  Reports that the odynophagia had been evaluated by GI with upper endoscopy that she reports to have been unremarkable.  Patient denies using alcohol or any diabetes medications recently.  She was seen in the emergency department yesterday with chest discomfort, had normal at bedtime troponin x3, nonischemic EKG, and no acute findings on chest x-ray. In the ED, patient is found to be afebrile, saturating well on room air, slightly bradycardic, and with blood pressure 190/76.  EKG features a normal sinus rhythm.  Chemistry panel notable for glucose of 62 creatinine 1.39, similar to priors.  CBC with leukocytosis to 12,400.  Urine was sent for culture and she was given a dose of Rocephin for bacteriuria, and also treated with IV dextrose, but continues to have hypoglycemia despite this.   Today, pt denied any new complaints. Still with poor appetite and fatigue, although with slow improvement.    Assessment/Plan: Principal Problem:   Hypoglycemia Active Problems:   Depression with anxiety   Essential hypertension   Stroke (HCC)   Hypothyroidism   CAD (coronary artery disease)   Chronic kidney disease, stage 3a   Recurrent hypoglycemia Hx of DM2 with A1c of 4.8, history of peripheral neuropathy Not  on any for DM for the past 2 months or more Poor appetite due to odynophagia Insulin and C-peptide levels are both elevated Proinsulin, sulfonylurea panel pending MRI abdomen protocol with no evidence of pancreatic mass or other pathologic mass in the abdomen Continue IV dextrose, reduce rate to 75 cc/h, plan to wean off in the a.m. and see what her CBGs are like Continue gabapentin Monitor closely, CBGs every 4 hours  Odynophagia Several weeks of pain with swallowing liquids and solids EGD unremarkable Scheduled outpatient to see ENT again  Noted bradycardia Likely 2/2 beta-blocker, currently asymptomatic Monitor closely  CKD stage IIIa Creatinine at baseline Daily BMP  Hypertension Continue propanolol, losartan, amlodipine, Imdur  CAD/CVA Chest pain-free Continue aspirin, Plavix, propanolol, losartan  Hypothyroidism Continue Synthroid  Early dementia Stable  Depression Continue bupropion, venlafaxine        Malnutrition Type:      Malnutrition Characteristics:      Nutrition Interventions:       Estimated body mass index is 27.02 kg/m as calculated from the following:   Height as of this encounter: 5\' 2"  (1.575 m).   Weight as of this encounter: 67 kg.     Code Status: Full  Family Communication: None at bedside  Disposition Plan: Status is: Inpatient  Remains inpatient appropriate because:Persistent severe electrolyte disturbances   Dispo: The patient is from: Home              Anticipated d/c is to: Home              Anticipated d/c date is: 1 day  Patient currently is not medically stable to d/c.    Consultants:  None  Procedures:  None  Antimicrobials:  None  DVT prophylaxis: Lovenox   Objective: Vitals:   09/05/19 1948 09/05/19 2319 09/06/19 0330 09/06/19 0830  BP: 140/86 139/89 (!) 106/47 125/66  Pulse: 66 65 (!) 54 (!) 52  Resp: 18 20 18 18   Temp: 97.9 F (36.6 C) 98.2 F (36.8 C) 97.8 F  (36.6 C) 97.6 F (36.4 C)  TempSrc: Oral Oral Oral Oral  SpO2: 99% 99% 99% 100%  Weight:      Height:        Intake/Output Summary (Last 24 hours) at 09/06/2019 1216 Last data filed at 09/06/2019 0600 Gross per 24 hour  Intake 1503 ml  Output --  Net 1503 ml   Filed Weights   09/02/19 1855 09/03/19 1252  Weight: 65.8 kg 67 kg    Exam:  General: NAD   Cardiovascular: S1, S2 present  Respiratory: CTAB  Abdomen: Soft, nontender, nondistended, bowel sounds present  Musculoskeletal: No bilateral pedal edema noted  Skin: Normal  Psychiatry: Normal mood   Data Reviewed: CBC: Recent Labs  Lab 09/01/19 0817 09/02/19 1905 09/04/19 0459 09/05/19 0610 09/06/19 0640  WBC 12.5* 12.4* 12.6* 9.1 7.7  HGB 12.2 13.7 12.0 12.2 11.4*  HCT 37.0 41.2 36.0 34.9* 32.7*  MCV 84.1 83.7 83.1 81.2 80.5  PLT 270 234 215 163 371   Basic Metabolic Panel: Recent Labs  Lab 09/01/19 0817 09/02/19 1905 09/04/19 0459 09/05/19 0610 09/06/19 0640  NA 139 142 131* 131* 132*  K 3.5 3.9 3.4* 3.9 3.6  CL 114* 111 102 101 100  CO2 18* 24 24 23 25   GLUCOSE 65* 62* 85 95 109*  BUN 30* 22* 12 11 12   CREATININE 1.34* 1.39* 1.15* 1.13* 1.13*  CALCIUM 8.2* 8.5* 8.0* 8.3* 8.0*   GFR: Estimated Creatinine Clearance: 53.5 mL/min (A) (by C-G formula based on SCr of 1.13 mg/dL (H)). Liver Function Tests: Recent Labs  Lab 09/03/19 0513  AST 61*  ALT 61*  ALKPHOS 125  BILITOT 0.6  PROT 6.8  ALBUMIN 3.5   No results for input(s): LIPASE, AMYLASE in the last 168 hours. No results for input(s): AMMONIA in the last 168 hours. Coagulation Profile: No results for input(s): INR, PROTIME in the last 168 hours. Cardiac Enzymes: No results for input(s): CKTOTAL, CKMB, CKMBINDEX, TROPONINI in the last 168 hours. BNP (last 3 results) No results for input(s): PROBNP in the last 8760 hours. HbA1C: No results for input(s): HGBA1C in the last 72 hours. CBG: Recent Labs  Lab 09/05/19 2321  09/06/19 0332 09/06/19 0400 09/06/19 1050 09/06/19 1206  GLUCAP 86 59* 162* 164* 174*   Lipid Profile: No results for input(s): CHOL, HDL, LDLCALC, TRIG, CHOLHDL, LDLDIRECT in the last 72 hours. Thyroid Function Tests: Recent Labs    09/04/19 0459  TSH 3.621   Anemia Panel: No results for input(s): VITAMINB12, FOLATE, FERRITIN, TIBC, IRON, RETICCTPCT in the last 72 hours. Urine analysis:    Component Value Date/Time   COLORURINE YELLOW (A) 09/02/2019 1927   APPEARANCEUR HAZY (A) 09/02/2019 1927   APPEARANCEUR Cloudy (A) 07/17/2014 1455   LABSPEC 1.009 09/02/2019 1927   LABSPEC 1.012 12/29/2013 1210   PHURINE 5.0 09/02/2019 1927   GLUCOSEU NEGATIVE 09/02/2019 1927   GLUCOSEU >=500 12/29/2013 1210   HGBUR NEGATIVE 09/02/2019 1927   HGBUR negative 04/14/2010 [place] 09/02/2019 1927   BILIRUBINUR small 08/22/2019 1014  BILIRUBINUR Negative 07/17/2014 1455   BILIRUBINUR Negative 12/29/2013 [place] 09/02/2019 1927   PROTEINUR NEGATIVE 09/02/2019 1927   UROBILINOGEN 0.2 08/22/2019 1014   UROBILINOGEN negative 04/14/2010 1435   NITRITE NEGATIVE 09/02/2019 1927   LEUKOCYTESUR LARGE (A) 09/02/2019 1927   LEUKOCYTESUR Negative 12/29/2013 1210   Sepsis Labs: @LABRCNTIP (procalcitonin:4,lacticidven:4)  ) Recent Results (from the past 240 hour(s))  SARS Coronavirus 2 by RT PCR (hospital order, performed in [place] hospital lab) Nasopharyngeal Nasopharyngeal Swab     Status: None   Collection Time: 09/01/19  3:27 PM   Specimen: Nasopharyngeal Swab  Result Value Ref Range Status   SARS Coronavirus 2 NEGATIVE NEGATIVE Final    Comment: (NOTE) SARS-CoV-2 target nucleic acids are NOT DETECTED.  The SARS-CoV-2 RNA is generally detectable in upper and lower respiratory specimens during the acute phase of infection. The lowest concentration of SARS-CoV-2 viral copies this assay can detect is 250 copies / mL. A negative result does not  preclude SARS-CoV-2 infection and should not be used as the sole basis for treatment or other patient management decisions.  A negative result may occur with improper specimen collection / handling, submission of specimen other than nasopharyngeal swab, presence of viral mutation(s) within the areas targeted by this assay, and inadequate number of viral copies (<250 copies / mL). A negative result must be combined with clinical observations, patient history, and epidemiological information.  Fact Sheet for Patients:   StrictlyIdeas.no  Fact Sheet for Healthcare Providers: BankingDealers.co.za  This test is not yet approved or  cleared by the [place] FDA and has been authorized for detection and/or diagnosis of SARS-CoV-2 by FDA under an Emergency Use Authorization (EUA).  This EUA will remain in effect (meaning this test can be used) for the duration of the COVID-19 declaration under Section 564(b)(1) of the Act, 21 U.S.C. section 360bbb-3(b)(1), unless the authorization is terminated or revoked sooner.  Performed at [facility], [address] [place], [place] [address]   Urine Culture     Status: Abnormal   Collection Time: 09/02/19  7:27 PM   Specimen: Urine, Random  Result Value Ref Range Status   Specimen Description   Final    URINE, RANDOM Performed at [facility], [address] [place], [place] [address]    Special Requests   Final    NONE Performed at [facility], [address] [place], [place] [address]    Culture MULTIPLE SPECIES PRESENT, SUGGEST RECOLLECTION (A)  Final   Report Status 09/04/2019 FINAL  Final  MRSA PCR Screening     Status: Abnormal   Collection Time: 09/03/19 12:51 PM   Specimen: Nasopharyngeal  Result Value Ref Range Status   MRSA by PCR POSITIVE (A) NEGATIVE Final    Comment:        The GeneXpert MRSA Assay (FDA approved for NASAL specimens only), is  one component of a comprehensive MRSA colonization surveillance program. It is not intended to diagnose MRSA infection nor to guide or monitor treatment for MRSA infections. RESULT CALLED TO, READ BACK BY AND VERIFIED WITH: Talbot Grumbling RN AT 0932 ON 09/03/19 [facility] Performed at [facility], [address] [place], [place] [address]       Studies: MR ABDOMEN W WO CONTRAST  Result Date: 09/06/2019 CLINICAL DATA:  Inpatient. Recurrent hypoglycemia. Concern for insulinoma. EXAM: MRI ABDOMEN WITHOUT AND WITH CONTRAST TECHNIQUE: Multiplanar multisequence MR imaging of the abdomen was performed both before and after  the administration of intravenous contrast. CONTRAST:  23mL GADAVIST GADOBUTROL 1 MMOL/ML IV SOLN COMPARISON:  03/19/2017 CT abdomen/pelvis. FINDINGS: Lower chest: No acute abnormality at the lung bases. Hepatobiliary: Normal liver size and configuration. No hepatic steatosis. No liver mass. Cholecystectomy. No biliary ductal dilatation. Common bile duct diameter 5 mm. No choledocholithiasis. No biliary masses, strictures or beading. Pancreas: No pancreatic mass or duct dilation. No foci of pancreatic hyperenhancement. No pancreas divisum. Spleen: Normal size. No mass. Adrenals/Urinary Tract: Normal adrenals. No hydronephrosis. Normal kidneys with no renal mass. Stomach/Bowel: Expected postsurgical changes from Roux-en-Y gastric bypass surgery with gastrojejunostomy and collapsed excluded distal stomach with no acute gastric abnormality. Visualized small and large bowel is normal caliber, with no bowel wall thickening. Vascular/Lymphatic: Normal caliber abdominal aorta. Patent portal, splenic, hepatic and renal veins. No pathologically enlarged lymph nodes in the abdomen. Other: No abdominal ascites or focal fluid collection. Musculoskeletal: No aggressive appearing focal osseous lesions. IMPRESSION: 1. No evidence of pancreatic mass or other pathologic mass in the abdomen. 2.  Expected postsurgical changes from Roux-en-Y gastric bypass surgery. 3. Cholecystectomy. No biliary ductal dilatation. No choledocholithiasis. Electronically Signed   By: Ilona Sorrel M.D.   On: 09/06/2019 08:15    Scheduled Meds: . amLODipine  5 mg Oral Daily  . aspirin EC  81 mg Oral Daily  . buPROPion  300 mg Oral Daily  . Chlorhexidine Gluconate Cloth  6 each Topical Daily  . Chlorhexidine Gluconate Cloth  6 each Topical Q0600  . clopidogrel  75 mg Oral Daily  . enoxaparin (LOVENOX) injection  40 mg Subcutaneous Q24H  . gabapentin  300 mg Oral BID  . isosorbide mononitrate  60 mg Oral Daily  . levothyroxine  25 mcg Oral QAC breakfast  . losartan  25 mg Oral Daily  . mupirocin ointment  1 application Nasal BID  . pantoprazole  40 mg Oral BID  . propranolol ER  60 mg Oral QHS  . scopolamine  1 patch Transdermal Q72H  . sodium chloride flush  3 mL Intravenous Once  . sodium chloride flush  3 mL Intravenous Q12H  . venlafaxine XR  150 mg Oral Q breakfast    Continuous Infusions: . dextrose 125 mL/hr at 09/06/19 0600     LOS: 3 days     Alma Friendly, MD [facility]  If 7PM-7AM, please contact night-coverage [email or web address] 09/06/2019, 12:16 PM

## 2019-09-07 LAB — BASIC METABOLIC PANEL
Anion gap: 7 (ref 5–15)
BUN: 14 mg/dL (ref 6–20)
CO2: 26 mmol/L (ref 22–32)
Calcium: 8.9 mg/dL (ref 8.9–10.3)
Chloride: 102 mmol/L (ref 98–111)
Creatinine, Ser: 1.22 mg/dL — ABNORMAL HIGH (ref 0.44–1.00)
GFR calc Af Amer: 60 mL/min — ABNORMAL LOW (ref >60)
GFR calc non Af Amer: 52 mL/min — ABNORMAL LOW (ref >60)
Glucose, Bld: 80 mg/dL (ref 70–99)
Potassium: 4.3 mmol/L (ref 3.5–5.1)
Sodium: 135 mmol/L (ref 135–145)

## 2019-09-07 LAB — CBC
HCT: 36.6 % (ref 36.0–46.0)
Hemoglobin: 12.4 g/dL (ref 12.0–15.0)
MCH: 27.9 pg (ref 26.0–34.0)
MCHC: 33.9 g/dL (ref 30.0–36.0)
MCV: 82.4 fL (ref 80.0–100.0)
Platelets: 177 10*3/uL (ref 150–400)
RBC: 4.44 MIL/uL (ref 3.87–5.11)
RDW: 13.9 % (ref 11.5–15.5)
WBC: 8.7 10*3/uL (ref 4.0–10.5)
nRBC: 0 % (ref 0.0–0.2)

## 2019-09-07 LAB — GLUCOSE, CAPILLARY
Glucose-Capillary: 74 mg/dL (ref 70–99)
Glucose-Capillary: 76 mg/dL (ref 70–99)
Glucose-Capillary: 77 mg/dL (ref 70–99)
Glucose-Capillary: 86 mg/dL (ref 70–99)
Glucose-Capillary: 130 mg/dL — ABNORMAL HIGH (ref 70–99)
Glucose-Capillary: 105 mg/dL — ABNORMAL HIGH (ref 70–99)

## 2019-09-07 NOTE — Progress Notes (Signed)
PROGRESS NOTE  [name] EHM:[id number] DOB: 12-Apr-1970 DOA: 09/02/2019 PCP: [name], MD  HPI/Recap of past 24 hours: HPI from Dr Rowe Clack [name] is a 49 y.o. female with medical history significant for history of type 2 diabetes mellitus no longer on medications for this, history of gastric bypass, depression and anxiety, coronary artery disease, history of CVA, and hypothyroidism, now presenting to the emergency department with recurrent hypoglycemia.  Patient reports suffering from pain with swallowing for several weeks now, has discomfort swallowing both liquids and solids but denies any choking, cough, or shortness of breath.  She denies any fevers or chills.  Reports that the odynophagia had been evaluated by GI with upper endoscopy that she reports to have been unremarkable.  Patient denies using alcohol or any diabetes medications recently.  She was seen in the emergency department yesterday with chest discomfort, had normal at bedtime troponin x3, nonischemic EKG, and no acute findings on chest x-ray. In the ED, patient is found to be afebrile, saturating well on room air, slightly bradycardic, and with blood pressure 190/76.  EKG features a normal sinus rhythm.  Chemistry panel notable for glucose of 62 creatinine 1.39, similar to priors.  CBC with leukocytosis to 12,400.  Urine was sent for culture and she was given a dose of Rocephin for bacteriuria, and also treated with IV dextrose, but continues to have hypoglycemia despite this.    Today, patient still with poor appetite, encourage patient to eat as much as she can.  Denied any new complaints.  Very eager to figure out why she has the recurrent hypoglycemia.    Assessment/Plan: Principal Problem:   Hypoglycemia Active Problems:   Depression with anxiety   Essential hypertension   Stroke (HCC)   Hypothyroidism   CAD (coronary artery disease)   Chronic kidney disease, stage 3a   Recurrent  hypoglycemia Hx of DM2 with A1c of 4.8, history of peripheral neuropathy Not on any for DM for the past 2 months or more ??Propanolol possible cause for recurrent hypoglycemia Poor appetite due to odynophagia Insulin and C-peptide levels are both elevated Proinsulin, sulfonylurea panel pending MRI abdomen protocol with no evidence of pancreatic mass or other pathologic mass in the abdomen Continue IV dextrose, reduce rate to 75 cc/h, plan to wean off and see what her CBGs are like Continue gabapentin Hold propanolol for now Monitor closely, CBGs every 4 hours  Odynophagia Several weeks of pain with swallowing liquids and solids EGD unremarkable Scheduled outpatient to see ENT again  Noted bradycardia Likely 2/2 beta-blocker, currently asymptomatic Hold propanolol for now Monitor closely  CKD stage IIIa Creatinine at baseline Daily BMP  Hypertension Continue losartan, amlodipine, Imdur, hold propanolol  CAD/CVA Chest pain-free Continue aspirin, Plavix, losartan  Hypothyroidism Continue Synthroid  Early dementia Stable  Depression Continue bupropion, venlafaxine        Malnutrition Type:      Malnutrition Characteristics:      Nutrition Interventions:       Estimated body mass index is 27.02 kg/m as calculated from the following:   Height as of this encounter: 5\' 2"  (1.575 m).   Weight as of this encounter: 67 kg.     Code Status: Full  Family Communication: None at bedside  Disposition Plan: Status is: Inpatient  Remains inpatient appropriate because:Persistent severe electrolyte disturbances   Dispo: The patient is from: Home              Anticipated d/c  is to: Home              Anticipated d/c date is: 1 day              Patient currently is not medically stable to d/c.    Consultants:  None  Procedures:  None  Antimicrobials:  None  DVT prophylaxis: Lovenox   Objective: Vitals:   09/07/19 0320 09/07/19 0759  09/07/19 1203 09/07/19 1606  BP: (!) 146/66 139/72 127/76 113/75  Pulse: (!) 58 63 70 91  Resp: 18 17 17 16   Temp: 98.1 F (36.7 C) 98.2 F (36.8 C) 98.6 F (37 C) 99.1 F (37.3 C)  TempSrc: Oral Oral Oral Oral  SpO2: 98% 100% 98% 98%  Weight:      Height:        Intake/Output Summary (Last 24 hours) at 09/07/2019 1620 Last data filed at 09/07/2019 0026 Gross per 24 hour  Intake --  Output 600 ml  Net -600 ml   Filed Weights   09/02/19 1855 09/03/19 1252  Weight: 65.8 kg 67 kg    Exam:  General: NAD   Cardiovascular: S1, S2 present  Respiratory: CTAB  Abdomen: Soft, nontender, nondistended, bowel sounds present  Musculoskeletal: No bilateral pedal edema noted  Skin: Normal  Psychiatry: Normal mood   Data Reviewed: CBC: Recent Labs  Lab 09/02/19 1905 09/04/19 0459 09/05/19 0610 09/06/19 0640 09/07/19 0536  WBC 12.4* 12.6* 9.1 7.7 8.7  HGB 13.7 12.0 12.2 11.4* 12.4  HCT 41.2 36.0 34.9* 32.7* 36.6  MCV 83.7 83.1 81.2 80.5 82.4  PLT 234 215 163 171 614   Basic Metabolic Panel: Recent Labs  Lab 09/02/19 1905 09/04/19 0459 09/05/19 0610 09/06/19 0640 09/07/19 0536  NA 142 131* 131* 132* 135  K 3.9 3.4* 3.9 3.6 4.3  CL 111 102 101 100 102  CO2 24 24 23 25 26   GLUCOSE 62* 85 95 109* 80  BUN 22* 12 11 12 14   CREATININE 1.39* 1.15* 1.13* 1.13* 1.22*  CALCIUM 8.5* 8.0* 8.3* 8.0* 8.9   GFR: Estimated Creatinine Clearance: 49.6 mL/min (A) (by C-G formula based on SCr of 1.22 mg/dL (H)). Liver Function Tests: Recent Labs  Lab 09/03/19 0513  AST 61*  ALT 61*  ALKPHOS 125  BILITOT 0.6  PROT 6.8  ALBUMIN 3.5   No results for input(s): LIPASE, AMYLASE in the last 168 hours. No results for input(s): AMMONIA in the last 168 hours. Coagulation Profile: No results for input(s): INR, PROTIME in the last 168 hours. Cardiac Enzymes: No results for input(s): CKTOTAL, CKMB, CKMBINDEX, TROPONINI in the last 168 hours. BNP (last 3 results) No results for  input(s): PROBNP in the last 8760 hours. HbA1C: No results for input(s): HGBA1C in the last 72 hours. CBG: Recent Labs  Lab 09/07/19 0200 09/07/19 0322 09/07/19 0758 09/07/19 1201 09/07/19 1607  GLUCAP 74 76 77 86 130*   Lipid Profile: No results for input(s): CHOL, HDL, LDLCALC, TRIG, CHOLHDL, LDLDIRECT in the last 72 hours. Thyroid Function Tests: No results for input(s): TSH, T4TOTAL, FREET4, T3FREE, THYROIDAB in the last 72 hours. Anemia Panel: No results for input(s): VITAMINB12, FOLATE, FERRITIN, TIBC, IRON, RETICCTPCT in the last 72 hours. Urine analysis:    Component Value Date/Time   COLORURINE YELLOW (A) 09/02/2019 1927   APPEARANCEUR HAZY (A) 09/02/2019 1927   APPEARANCEUR Cloudy (A) 07/17/2014 1455   LABSPEC 1.009 09/02/2019 1927   LABSPEC 1.012 12/29/2013 1210   PHURINE 5.0 09/02/2019 1927  GLUCOSEU NEGATIVE 09/02/2019 1927   GLUCOSEU >=500 12/29/2013 1210   HGBUR NEGATIVE 09/02/2019 1927   HGBUR negative 04/14/2010 [place] 09/02/2019 1927   BILIRUBINUR small 08/22/2019 1014   BILIRUBINUR Negative 07/17/2014 1455   BILIRUBINUR Negative 12/29/2013 [place] 09/02/2019 1927   PROTEINUR NEGATIVE 09/02/2019 1927   UROBILINOGEN 0.2 08/22/2019 1014   UROBILINOGEN negative 04/14/2010 1435   NITRITE NEGATIVE 09/02/2019 1927   LEUKOCYTESUR LARGE (A) 09/02/2019 1927   LEUKOCYTESUR Negative 12/29/2013 1210   Sepsis Labs: @LABRCNTIP (procalcitonin:4,lacticidven:4)  ) Recent Results (from the past 240 hour(s))  SARS Coronavirus 2 by RT PCR (hospital order, performed in [place] hospital lab) Nasopharyngeal Nasopharyngeal Swab     Status: None   Collection Time: 09/01/19  3:27 PM   Specimen: Nasopharyngeal Swab  Result Value Ref Range Status   SARS Coronavirus 2 NEGATIVE NEGATIVE Final    Comment: (NOTE) SARS-CoV-2 target nucleic acids are NOT DETECTED.  The SARS-CoV-2 RNA is generally detectable in upper and  lower respiratory specimens during the acute phase of infection. The lowest concentration of SARS-CoV-2 viral copies this assay can detect is 250 copies / mL. A negative result does not preclude SARS-CoV-2 infection and should not be used as the sole basis for treatment or other patient management decisions.  A negative result may occur with improper specimen collection / handling, submission of specimen other than nasopharyngeal swab, presence of viral mutation(s) within the areas targeted by this assay, and inadequate number of viral copies (<250 copies / mL). A negative result must be combined with clinical observations, patient history, and epidemiological information.  Fact Sheet for Patients:   StrictlyIdeas.no  Fact Sheet for Healthcare Providers: BankingDealers.co.za  This test is not yet approved or  cleared by the [place] FDA and has been authorized for detection and/or diagnosis of SARS-CoV-2 by FDA under an Emergency Use Authorization (EUA).  This EUA will remain in effect (meaning this test can be used) for the duration of the COVID-19 declaration under Section 564(b)(1) of the Act, 21 U.S.C. section 360bbb-3(b)(1), unless the authorization is terminated or revoked sooner.  Performed at [facility], [address] [place], [place] [address]   Urine Culture     Status: Abnormal   Collection Time: 09/02/19  7:27 PM   Specimen: Urine, Random  Result Value Ref Range Status   Specimen Description   Final    URINE, RANDOM Performed at [facility], [address] [place], [place] [address]    Special Requests   Final    NONE Performed at [facility], [address] [place], [place] [address]    Culture MULTIPLE SPECIES PRESENT, SUGGEST RECOLLECTION (A)  Final   Report Status 09/04/2019 FINAL  Final  MRSA PCR Screening     Status: Abnormal   Collection Time: 09/03/19 12:51 PM    Specimen: Nasopharyngeal  Result Value Ref Range Status   MRSA by PCR POSITIVE (A) NEGATIVE Final    Comment:        The GeneXpert MRSA Assay (FDA approved for NASAL specimens only), is one component of a comprehensive MRSA colonization surveillance program. It is not intended to diagnose MRSA infection nor to guide or monitor treatment for MRSA infections. RESULT CALLED TO, READ BACK BY AND VERIFIED WITH: Talbot Grumbling RN AT 4818 ON 09/03/19 [facility] Performed at [facility], [address], [place], [place] [address]       Studies: No results found.  Scheduled Meds: . amLODipine  5 mg Oral Daily  . aspirin EC  81 mg Oral Daily  . buPROPion  300 mg Oral Daily  . Chlorhexidine Gluconate Cloth  6 each Topical Daily  . clopidogrel  75 mg Oral Daily  . enoxaparin (LOVENOX) injection  40 mg Subcutaneous Q24H  . gabapentin  300 mg Oral BID  . isosorbide mononitrate  60 mg Oral Daily  . levothyroxine  25 mcg Oral QAC breakfast  . losartan  25 mg Oral Daily  . mupirocin ointment  1 application Nasal BID  . pantoprazole  40 mg Oral BID  . scopolamine  1 patch Transdermal Q72H  . sodium chloride flush  3 mL Intravenous Once  . sodium chloride flush  3 mL Intravenous Q12H  . venlafaxine XR  150 mg Oral Q breakfast    Continuous Infusions: . dextrose 75 mL/hr at 09/07/19 1008     LOS: 4 days     Alma Friendly, MD [facility]  If 7PM-7AM, please contact night-coverage [email or web address] 09/07/2019, 4:20 PM

## 2019-09-07 NOTE — Progress Notes (Signed)
D/C tele monitoring per Dr Horris Latino

## 2019-09-07 NOTE — Care Management Important Message (Signed)
Important Message  Patient Details  Name: [name] MRN: [id number] Date of Birth: 08-12-1970   Medicare Important Message Given:  Yes     Shelbie Hutching, RN 09/07/2019, 11:20 AM

## 2019-09-08 ENCOUNTER — Inpatient Hospital Stay: Payer: Medicare Other

## 2019-09-08 DIAGNOSIS — R079 Chest pain, unspecified: Secondary | ICD-10-CM

## 2019-09-08 DIAGNOSIS — I25119 Atherosclerotic heart disease of native coronary artery with unspecified angina pectoris: Secondary | ICD-10-CM

## 2019-09-08 DIAGNOSIS — E785 Hyperlipidemia, unspecified: Secondary | ICD-10-CM

## 2019-09-08 DIAGNOSIS — I5020 Unspecified systolic (congestive) heart failure: Secondary | ICD-10-CM

## 2019-09-08 DIAGNOSIS — N183 Chronic kidney disease, stage 3 unspecified: Secondary | ICD-10-CM

## 2019-09-08 LAB — CBC
HCT: 35.8 % — ABNORMAL LOW (ref 36.0–46.0)
Hemoglobin: 11.7 g/dL — ABNORMAL LOW (ref 12.0–15.0)
MCH: 27.7 pg (ref 26.0–34.0)
MCHC: 32.7 g/dL (ref 30.0–36.0)
MCV: 84.8 fL (ref 80.0–100.0)
Platelets: 170 10*3/uL (ref 150–400)
RBC: 4.22 MIL/uL (ref 3.87–5.11)
RDW: 14 % (ref 11.5–15.5)
WBC: 10.5 10*3/uL (ref 4.0–10.5)
nRBC: 0 % (ref 0.0–0.2)

## 2019-09-08 LAB — TROPONIN I (HIGH SENSITIVITY)
Troponin I (High Sensitivity): 8 ng/L (ref <18)
Troponin I (High Sensitivity): 8 ng/L (ref <18)

## 2019-09-08 LAB — BASIC METABOLIC PANEL
Anion gap: 8 (ref 5–15)
BUN: 18 mg/dL (ref 6–20)
CO2: 24 mmol/L (ref 22–32)
Calcium: 8.5 mg/dL — ABNORMAL LOW (ref 8.9–10.3)
Chloride: 104 mmol/L (ref 98–111)
Creatinine, Ser: 1.4 mg/dL — ABNORMAL HIGH (ref 0.44–1.00)
GFR calc Af Amer: 51 mL/min — ABNORMAL LOW (ref >60)
GFR calc non Af Amer: 44 mL/min — ABNORMAL LOW (ref >60)
Glucose, Bld: 90 mg/dL (ref 70–99)
Potassium: 4.3 mmol/L (ref 3.5–5.1)
Sodium: 136 mmol/L (ref 135–145)

## 2019-09-08 LAB — GLUCOSE, CAPILLARY
Glucose-Capillary: 123 mg/dL — ABNORMAL HIGH (ref 70–99)
Glucose-Capillary: 137 mg/dL — ABNORMAL HIGH (ref 70–99)
Glucose-Capillary: 162 mg/dL — ABNORMAL HIGH (ref 70–99)
Glucose-Capillary: 218 mg/dL — ABNORMAL HIGH (ref 70–99)
Glucose-Capillary: 63 mg/dL — ABNORMAL LOW (ref 70–99)
Glucose-Capillary: 64 mg/dL — ABNORMAL LOW (ref 70–99)
Glucose-Capillary: 65 mg/dL — ABNORMAL LOW (ref 70–99)
Glucose-Capillary: 68 mg/dL — ABNORMAL LOW (ref 70–99)
Glucose-Capillary: 71 mg/dL (ref 70–99)
Glucose-Capillary: 75 mg/dL (ref 70–99)
Glucose-Capillary: 77 mg/dL (ref 70–99)

## 2019-09-08 MED ORDER — ROSUVASTATIN CALCIUM 10 MG PO TABS
5.0000 mg | ORAL_TABLET | Freq: Every day | ORAL | Status: DC
  Administered 2019-09-08 – 2019-09-11 (×4): 5 mg via ORAL
  Filled 2019-09-08 (×5): qty 1

## 2019-09-08 MED ORDER — ISOSORBIDE MONONITRATE ER 30 MG PO TB24
90.0000 mg | ORAL_TABLET | Freq: Every day | ORAL | Status: DC
  Administered 2019-09-09 – 2019-09-12 (×4): 90 mg via ORAL
  Filled 2019-09-08 (×4): qty 3

## 2019-09-08 MED ORDER — ISOSORBIDE MONONITRATE ER 30 MG PO TB24
30.0000 mg | ORAL_TABLET | Freq: Once | ORAL | Status: AC
  Administered 2019-09-08: 15:00:00 30 mg via ORAL
  Filled 2019-09-08: qty 1

## 2019-09-08 NOTE — Progress Notes (Signed)
PROGRESS NOTE  [name] OIN:[id number] DOB: 1970-09-01 DOA: 09/02/2019 PCP: [name], MD  HPI/Recap of past 24 hours: HPI from Dr Rowe Clack [name] Kendra is a 49 y.o. female with medical history significant for history of type 2 diabetes mellitus no longer on medications for this, history of gastric bypass, depression and anxiety, coronary artery disease, history of CVA, and hypothyroidism, now presenting to the emergency department with recurrent hypoglycemia.  Patient reports suffering from pain with swallowing for several weeks now, has discomfort swallowing both liquids and solids but denies any choking, cough, or shortness of breath.  She denies any fevers or chills.  Reports that the odynophagia had been evaluated by GI with upper endoscopy that she reports to have been unremarkable.  Patient denies using alcohol or any diabetes medications recently.  She was seen in the emergency department yesterday with chest discomfort, had normal at bedtime troponin x3, nonischemic EKG, and no acute findings on chest x-ray. In the ED, patient is found to be afebrile, saturating well on room air, slightly bradycardic, and with blood pressure 190/76.  EKG features a normal sinus rhythm.  Chemistry panel notable for glucose of 62 creatinine 1.39, similar to priors.  CBC with leukocytosis to 12,400.  Urine was sent for culture and she was given a dose of Rocephin for bacteriuria, and also treated with IV dextrose, but continues to have hypoglycemia despite this.    Today, patient still with poor appetite but reports slightly improving.  Reported left-sided chest pain radiating to the left shoulder with dyspnea upon exertion.  Frustrated overall with medical issues.  Reports pain depressed/low mood denies SI/HI.  Cardiology consulted.    Assessment/Plan: Principal Problem:   Hypoglycemia Active Problems:   Depression with anxiety   Essential hypertension   Stroke (HCC)    Hypothyroidism   CAD (coronary artery disease)   Chronic kidney disease, stage 3a   Recurrent hypoglycemia Hx of DM2 with A1c of 4.8, history of peripheral neuropathy Not on any for DM for the past 2 months or more ??Propanolol possible cause for recurrent hypoglycemia Poor appetite due to odynophagia Insulin and C-peptide levels are both elevated Proinsulin, sulfonylurea panel pending MRI abdomen protocol with no evidence of pancreatic mass or other pathologic mass in the abdomen Continue IV dextrose, reduce rate to 75 cc/h, plan to wean off and see what her CBGs are like Continue gabapentin Hold propanolol for now Monitor closely, CBGs every 4 hours  CAD Complex cardiac history, complained of chest pain radiating to left shoulder on 09/08/2019 Troponins negative, EKG with no acute ST changes Last cath in 04/2019 Cardiology consulted Continue aspirin, Plavix, losartan, amlodipine, Imdur, statins Telemetry  Odynophagia Several weeks of pain with swallowing liquids and solids EGD unremarkable Scheduled outpatient to see ENT again  Noted bradycardia Likely 2/2 beta-blocker, currently asymptomatic Hold propanolol for now (was prescribed by neurology for tremors) Monitor closely  CKD stage IIIa Creatinine at baseline Daily BMP  Hypertension Continue losartan, amlodipine, Imdur, hold propanolol  Hypothyroidism Continue Synthroid  Early dementia Stable  Depression Continue bupropion, venlafaxine        Malnutrition Type:      Malnutrition Characteristics:      Nutrition Interventions:       Estimated body mass index is 27.02 kg/m as calculated from the following:   Height as of this encounter: 5\' 2"  (1.575 m).   Weight as of this encounter: 67 kg.     Code Status: Full  Family Communication: None at bedside  Disposition Plan: Status is: Inpatient  Remains inpatient appropriate because:Persistent severe electrolyte disturbances and Inpatient  level of care appropriate due to severity of illness   Dispo: The patient is from: Home              Anticipated d/c is to: Home              Anticipated d/c date is: 1 day              Patient currently is not medically stable to d/c.    Consultants:  Cardiology  Procedures:  None  Antimicrobials:  None  DVT prophylaxis: Lovenox   Objective: Vitals:   09/08/19 0504 09/08/19 0750 09/08/19 1148 09/08/19 1608  BP: 133/77 (!) 156/77 129/82 138/85  Pulse: 72 66 83 74  Resp: 15 17 17 16   Temp: 98.5 F (36.9 C) 97.7 F (36.5 C) 98.4 F (36.9 C) 97.8 F (36.6 C)  TempSrc: Oral Oral Oral Oral  SpO2: 100% 92% 99% 99%  Weight:      Height:        Intake/Output Summary (Last 24 hours) at 09/08/2019 1804 Last data filed at 09/08/2019 1356 Gross per 24 hour  Intake 3025.96 ml  Output --  Net 3025.96 ml   Filed Weights   09/02/19 1855 09/03/19 1252  Weight: 65.8 kg 67 kg    Exam:  General: NAD   Cardiovascular: S1, S2 present  Respiratory: CTAB  Abdomen: Soft, nontender, nondistended, bowel sounds present  Musculoskeletal: No bilateral pedal edema noted  Skin: Normal  Psychiatry: Normal mood   Data Reviewed: CBC: Recent Labs  Lab 09/04/19 0459 09/05/19 0610 09/06/19 0640 09/07/19 0536 09/08/19 0713  WBC 12.6* 9.1 7.7 8.7 10.5  HGB 12.0 12.2 11.4* 12.4 11.7*  HCT 36.0 34.9* 32.7* 36.6 35.8*  MCV 83.1 81.2 80.5 82.4 84.8  PLT 215 163 171 177 295   Basic Metabolic Panel: Recent Labs  Lab 09/04/19 0459 09/05/19 0610 09/06/19 0640 09/07/19 0536 09/08/19 0713  NA 131* 131* 132* 135 136  K 3.4* 3.9 3.6 4.3 4.3  CL 102 101 100 102 104  CO2 24 23 25 26 24   GLUCOSE 85 95 109* 80 90  BUN 12 11 12 14 18   CREATININE 1.15* 1.13* 1.13* 1.22* 1.40*  CALCIUM 8.0* 8.3* 8.0* 8.9 8.5*   GFR: Estimated Creatinine Clearance: 43.2 mL/min (A) (by C-G formula based on SCr of 1.4 mg/dL (H)). Liver Function Tests: Recent Labs  Lab 09/03/19 0513  AST  61*  ALT 61*  ALKPHOS 125  BILITOT 0.6  PROT 6.8  ALBUMIN 3.5   No results for input(s): LIPASE, AMYLASE in the last 168 hours. No results for input(s): AMMONIA in the last 168 hours. Coagulation Profile: No results for input(s): INR, PROTIME in the last 168 hours. Cardiac Enzymes: No results for input(s): CKTOTAL, CKMB, CKMBINDEX, TROPONINI in the last 168 hours. BNP (last 3 results) No results for input(s): PROBNP in the last 8760 hours. HbA1C: No results for input(s): HGBA1C in the last 72 hours. CBG: Recent Labs  Lab 09/08/19 0747 09/08/19 0904 09/08/19 1147 09/08/19 1610 09/08/19 1656  GLUCAP 68* 218* 75 64* 137*   Lipid Profile: No results for input(s): CHOL, HDL, LDLCALC, TRIG, CHOLHDL, LDLDIRECT in the last 72 hours. Thyroid Function Tests: No results for input(s): TSH, T4TOTAL, FREET4, T3FREE, THYROIDAB in the last 72 hours. Anemia Panel: No results for input(s): VITAMINB12, FOLATE, FERRITIN, TIBC, IRON, RETICCTPCT in the  last 72 hours. Urine analysis:    Component Value Date/Time   COLORURINE YELLOW (A) 09/02/2019 1927   APPEARANCEUR HAZY (A) 09/02/2019 1927   APPEARANCEUR Cloudy (A) 07/17/2014 1455   LABSPEC 1.009 09/02/2019 1927   LABSPEC 1.012 12/29/2013 1210   PHURINE 5.0 09/02/2019 1927   GLUCOSEU NEGATIVE 09/02/2019 1927   GLUCOSEU >=500 12/29/2013 1210   HGBUR NEGATIVE 09/02/2019 1927   HGBUR negative 04/14/2010 1435   BILIRUBINUR NEGATIVE 09/02/2019 1927   BILIRUBINUR small 08/22/2019 1014   BILIRUBINUR Negative 07/17/2014 1455   BILIRUBINUR Negative 12/29/2013 1210   [place] 09/02/2019 1927   PROTEINUR NEGATIVE 09/02/2019 1927   UROBILINOGEN 0.2 08/22/2019 1014   UROBILINOGEN negative 04/14/2010 1435   NITRITE NEGATIVE 09/02/2019 1927   LEUKOCYTESUR LARGE (A) 09/02/2019 1927   LEUKOCYTESUR Negative 12/29/2013 1210   Sepsis Labs: @LABRCNTIP (procalcitonin:4,lacticidven:4)  ) Recent Results (from the past 240 hour(s))  SARS  Coronavirus 2 by RT PCR (hospital order, performed in [place] hospital lab) Nasopharyngeal Nasopharyngeal Swab     Status: None   Collection Time: 09/01/19  3:27 PM   Specimen: Nasopharyngeal Swab  Result Value Ref Range Status   SARS Coronavirus 2 NEGATIVE NEGATIVE Final    Comment: (NOTE) SARS-CoV-2 target nucleic acids are NOT DETECTED.  The SARS-CoV-2 RNA is generally detectable in upper and lower respiratory specimens during the acute phase of infection. The lowest concentration of SARS-CoV-2 viral copies this assay can detect is 250 copies / mL. A negative result does not preclude SARS-CoV-2 infection and should not be used as the sole basis for treatment or other patient management decisions.  A negative result may occur with improper specimen collection / handling, submission of specimen other than nasopharyngeal swab, presence of viral mutation(s) within the areas targeted by this assay, and inadequate number of viral copies (<250 copies / mL). A negative result must be combined with clinical observations, patient history, and epidemiological information.  Fact Sheet for Patients:   StrictlyIdeas.no  Fact Sheet for Healthcare Providers: BankingDealers.co.za  This test is not yet approved or  cleared by the [place] FDA and has been authorized for detection and/or diagnosis of SARS-CoV-2 by FDA under an Emergency Use Authorization (EUA).  This EUA will remain in effect (meaning this test can be used) for the duration of the COVID-19 declaration under Section 564(b)(1) of the Act, 21 U.S.C. section 360bbb-3(b)(1), unless the authorization is terminated or revoked sooner.  Performed at [facility], [address], [place], [place] [address]   Urine Culture     Status: Abnormal   Collection Time: 09/02/19  7:27 PM   Specimen: Urine, Random  Result Value Ref Range Status   Specimen Description   Final     URINE, RANDOM Performed at [facility], [address] [place], [place] [address]    Special Requests   Final    NONE Performed at [facility], [address] [place],  [address]    Culture MULTIPLE SPECIES PRESENT, SUGGEST RECOLLECTION (A)  Final   Report Status 09/04/2019 FINAL  Final  MRSA PCR Screening     Status: Abnormal   Collection Time: 09/03/19 12:51 PM   Specimen: Nasopharyngeal  Result Value Ref Range Status   MRSA by PCR POSITIVE (A) NEGATIVE Final    Comment:        The GeneXpert MRSA Assay (FDA approved for NASAL specimens only), is one component of a comprehensive MRSA colonization surveillance program. It is not intended  to diagnose MRSA infection nor to guide or monitor treatment for MRSA infections. RESULT CALLED TO, READ BACK BY AND VERIFIED WITH: Talbot Grumbling RN AT 3888 ON 09/03/19 [facility] Performed at [facility], [address] [place], [place] [address]       Studies: [facility] Chest [facility] 1 View  Result Date: 09/08/2019 CLINICAL DATA:  Hypoglycemia.  Shortness of breath. EXAM: PORTABLE CHEST 1 VIEW COMPARISON:  04/18/2019.  10/12/2018. FINDINGS: Mediastinum and hilar structures normal. Heart size stable. Right-sided aortic arch again noted. No pulmonary venous congestion. Low lung volumes. Mild right base subsegmental atelectasis. No focal alveolar infiltrate. No pleural effusion or pneumothorax. Surgical clips noted over the left upper chest. Degenerative change thoracic spine. IMPRESSION: 1.  Mild right base subsegmental atelectasis. 2. Right-sided aortic arch again noted. Heart size stable. No pulmonary venous congestion. Electronically Signed   By: Marcello Moores  Register   On: 09/08/2019 09:39    Scheduled Meds: . amLODipine  5 mg Oral Daily  . aspirin EC  81 mg Oral Daily  . buPROPion  300 mg Oral Daily  . Chlorhexidine Gluconate Cloth  6 each Topical Daily  . clopidogrel  75 mg Oral Daily  . enoxaparin (LOVENOX)  injection  40 mg Subcutaneous Q24H  . gabapentin  300 mg Oral BID  . [START ON 09/09/2019] isosorbide mononitrate  90 mg Oral Daily  . levothyroxine  25 mcg Oral QAC breakfast  . losartan  25 mg Oral Daily  . pantoprazole  40 mg Oral BID  . rosuvastatin  5 mg Oral q1800  . scopolamine  1 patch Transdermal Q72H  . sodium chloride flush  3 mL Intravenous Once  . sodium chloride flush  3 mL Intravenous Q12H  . venlafaxine XR  150 mg Oral Q breakfast    Continuous Infusions: . dextrose 75 mL/hr at 09/08/19 1422     LOS: 5 days     Alma Friendly, MD [facility]  If 7PM-7AM, please contact night-coverage [email or web address] 09/08/2019, 6:04 PM

## 2019-09-08 NOTE — Progress Notes (Signed)
Dr Horris Latino made aware that pt complains of left shoulder pain, chest pain and pressure, states that it has been like this for a few days but feels worse today, new orders placed for EKG, troponins, chest xray, states that she will come see pt soon

## 2019-09-08 NOTE — Consult Note (Signed)
Cardiology Consultation:   Patient ID: [name]; [id number]; 08-13-1970   Admit date: 09/02/2019 Date of Consult: 09/08/2019  Primary Care Provider: [name], MD Primary Cardiologist: [name] Primary Electrophysiologist:  None   Patient Profile:   [name] is a 49 y.o. female with a hx of CAD s/p prior PCI to the LAD and D1 with stable coronary anatomy by most recent cath in 04/2019, HFrEF secondary to ICM with prior EF of 45-50% in 2016 subsequent normalization of LVSF by echo in 10/2015, CKD stage III, prior CVAs, left carotid to subclavian artery bypass with subclavian artery ligation, aortic arch aneurysm, anemia, obesity status post gastric bypass, bipolar disorder, DM with polyneuropathy and hypoglycemia, migraine disorder, HTN, HLD, and frequent falls/syncope/near syncope  who is being seen today for the evaluation of chronic chest pain at the request of Dr. Horris Latino.  History of Present Illness:   [name] was diagnosed with CAD in 08/2003 with LHC at that time showing moderate LAD disease that was medically managed. She was seen in 11/2014, with angina and underwent LHC with PCI/DES placement to the mid LAD. She has not been able to tolerate escalation of medical management in the setting of relative hypotension or adverse effects of antianginal therapy. In 06/2015, she had repeat LHC that showed a patent LAD stent with development of significant D1 disease that was successfully treated with PCI/DES. With recurrent angina in 06/2017, LHC showed severe stenosis in the D2 s/p PTCA. Recurrent angina in 08/2017 led to repeat LHC that showed restenosis of the D2 that was treated with cutting balloon angioplasty. Repeat LHC in 12/2017 and was found 95% ostial stenosis/restenosis of the D2 with significant apical LAD disease. Given the recurrent restenosis with the D2, medical therapy was felt to be warranted. She was admitted to the hospital in 02/2018 with CVA  with MRI showing a late acute/early subacute left medial frontal lobe infarct. Echo at that time showed showed normal LVSF without cardiac source of emboli.   She was admitted to the hospital in 04/2019 chest pain in the setting of hypertensive urgency.  Cardiac enzymes were negative.  She underwent LHC on 04/20/2019 which showed no significant change in her coronary anatomy dating back to cardiac cath from 2019 with severe native vessel CAD, patent stents in the LAD and diagonal, stable severe ostial/proximal D2 disease that was without change when compared to prior cath and not amenable to stenting.  Her distal LAD was small and tapered to severe disease which also was not amenable to stenting.  Continued medical management was recommended.  She was recently seen in the [facility] ED on 7/30 with hypoglycemia along with dysphagia and intermittent chest pain.  At that time, high-sensitivity troponin was negative x3.  EKG was nonacute.  She ultimately ended up leaving AMA.  She returned to the ED the next day with continued hypoglycemic episodes.  Notes indicate GI evaluation has been unremarkable.  MRI of the abdomen this admission has been unrevealing.  She has continued to note a poor appetite throughout the admission.  On the morning of 8/6, she reported to clinical staff she was having continued chest pressure and shoulder discomfort that has been present for a few days though felt worse at this time.  EKG was nonacute.  High-sensitivity troponin negative x2 today.  She notes that she has been having intermittent left-sided chest pain for the past several days though earlier today it progressed to  a level of 8.5 out of 10.  She indicates her chest feels similar, though a little tighter when compared to her prior episodes of angina.  She continues to indicate her pain is 8.5 out of 10 and is resting comfortably in bed.  There is some associated dyspnea.  No palpitations, dizziness, presyncope, or syncope.  Past  Medical History:  Diagnosis Date  . Acute colitis 01/27/2017  . Acute pyelonephritis   . Anemia    iron deficiency anemia  . Aortic arch aneurysm ([place])   . BRCA negative 2014  . CAD (coronary artery disease)    a. 08/2003 Cath: LAD 30-40-med Rx; b. 11/2014 PCI: LAD 53m(3.25x23 Xience Alpine DES); c. 06/2015 PCI: D1 (2.25x12 Resolute Integrity DES); d. 06/2017 PCI: Patent mLAD stent, D2 95 (PTCA); e. 09/2017 PCI: D2 99ost (CBA); d. 12/2017 Cath: LM nl, LAD 361m80d (small), D1 40ost, D2 95ost, LCX 40p, RCA 40ost/p->Med rx for D2 given restenosis.  . Colitis 06/03/2015  . Colon polyp   . CVA (cerebral vascular accident) ([facility]   Left side weakness.   . Degenerative tear of glenoid labrum of right shoulder 03/15/2017  . Diabetes mellitus without complication ([facility]  . Family history of breast cancer    BRCA neg 2014  . Femur fracture, left (HCMarietta9/10/2018  . Gastric ulcer 04/27/2011  . History of echocardiogram    a. 03/2017 Echo: EF 60-65%, no rwma; b. 02/2018 Echo: EF 60-65%, no rwma. Nl RV fxn. No cardiac source of emboli (admitted w/ stroke).  . Hypertension   . Malignant melanoma of skin of scalp ([facility]  . MI, acute, non ST segment elevation ([facility]  . Neuromuscular disorder ([facility] [place]  . S/P drug eluting coronary stent placement 06/04/2015  . Sepsis ([facility]2/14/2019  . Stroke ([facility]   a. 02/2018 MRI: 70m68mate acute/early subacute L medial frontal lobe inarct; b. 02/2018 MRA No large vessel occlusion or aneurysm. Mod to sev L P2 stenosis. thready L vertebral artery, diffusely dzs'd; c. 02/2018 Carotid U/S: <50% bilat ICA dzs.    Past Surgical History:  Procedure Laterality Date  . APPENDECTOMY    . CARDIAC CATHETERIZATION N/A 11/09/2014   Procedure: Coronary Angiography;  Surgeon: TimMinna MerrittsD;  [facility];  Service: Cardiovascular;  Laterality: N/A;  . CARDIAC CATHETERIZATION N/A 11/12/2014   Procedure: Coronary Stent Intervention;  Surgeon: AleIsaias CowmanD;   [facility];  Service: Cardiovascular;  Laterality: N/A;  . CARDIAC CATHETERIZATION N/A 04/18/2015   Procedure: Left Heart Cath and Coronary Angiography;  Surgeon: TimMinna MerrittsD;  [place] [facility];  Service: Cardiovascular;  Laterality: N/A;  . CARDIAC CATHETERIZATION Left 06/04/2015   Procedure: Left Heart Cath and Coronary Angiography;  Surgeon: MuhWellington HampshireD;  [facility];  Service: Cardiovascular;  Laterality: Left;  . CARDIAC CATHETERIZATION N/A 06/04/2015   Procedure: Coronary Stent Intervention;  Surgeon: MuhWellington HampshireD;  [place] [facility];  Service: Cardiovascular;  Laterality: N/A;  . CESAREAN SECTION  2001  . CHOLECYSTECTOMY N/A 11/18/2016   Procedure: LAPAROSCOPIC CHOLECYSTECTOMY WITH INTRAOPERATIVE CHOLANGIOGRAM;  Surgeon: SanChristene LyeD;  [facility];  Service: General;  Laterality: N/A;  . COLONOSCOPY WITH PROPOFOL N/A 04/27/2016   Procedure: COLONOSCOPY WITH PROPOFOL;  Surgeon: DarLucilla LameD;  [facility];  Laterality: N/A;  . COLONOSCOPY WITH PROPOFOL N/A 01/12/2018  Procedure: COLONOSCOPY WITH PROPOFOL;  Surgeon: Toledo, Benay Pike, MD;  Location: [facility];  Service: Endoscopy;  Laterality: N/A;  . CORONARY ANGIOPLASTY    . CORONARY BALLOON ANGIOPLASTY N/A 06/29/2017   Procedure: CORONARY BALLOON ANGIOPLASTY;  Surgeon: Wellington Hampshire, MD;  Location: [place] CV LAB;  Service: Cardiovascular;  Laterality: N/A;  . CORONARY BALLOON ANGIOPLASTY N/A 09/20/2017   Procedure: CORONARY BALLOON ANGIOPLASTY;  Surgeon: Wellington Hampshire, MD;  Location: [place] CV LAB;  Service: Cardiovascular;  Laterality: N/A;  . DILATION AND CURETTAGE OF UTERUS    . ESOPHAGOGASTRODUODENOSCOPY (EGD) WITH PROPOFOL N/A 09/14/2014   Procedure: ESOPHAGOGASTRODUODENOSCOPY (EGD) WITH PROPOFOL;  Surgeon: Josefine Class, MD;  [facility]OSCOPY;   Service: Endoscopy;  Laterality: N/A;  . ESOPHAGOGASTRODUODENOSCOPY (EGD) WITH PROPOFOL N/A 04/27/2016   Procedure: ESOPHAGOGASTRODUODENOSCOPY (EGD) WITH PROPOFOL;  Surgeon: [name], MD;  Location: [place];  Service: Endoscopy;  Laterality: N/A;  Diabetic - oral meds  . ESOPHAGOGASTRODUODENOSCOPY (EGD) WITH PROPOFOL N/A 01/12/2018   Procedure: ESOPHAGOGASTRODUODENOSCOPY (EGD) WITH PROPOFOL;  Surgeon: Toledo, Benay Pike, MD;  Location: [facility];  Service: Endoscopy;  Laterality: N/A;  . ESOPHAGOGASTRODUODENOSCOPY (EGD) WITH PROPOFOL N/A 04/11/2019   Procedure: ESOPHAGOGASTRODUODENOSCOPY (EGD) WITH PROPOFOL;  Surgeon: [name], MD;  Location: [facility];  Service: Endoscopy;  Laterality: N/A;  . GASTRIC BYPASS  09/2009   [place] (IM) NAIL INTERTROCHANTERIC Left 10/13/2018   Procedure: INTRAMEDULLARY (IM) NAIL INTERTROCHANTRIC;  Surgeon: Leandrew Koyanagi, MD;  Location: [place];  Service: Orthopedics;  Laterality: Left;  . Left Carotid to sublcavian artery bypass w/ subclavian artery ligation     a. Performed @ [place].  . LEFT HEART CATH AND CORONARY ANGIOGRAPHY Left 06/29/2017   Procedure: LEFT HEART CATH AND CORONARY ANGIOGRAPHY;  Surgeon: Wellington Hampshire, MD;  Location: [place] CV LAB;  Service: Cardiovascular;  Laterality: Left;  . LEFT HEART CATH AND CORONARY ANGIOGRAPHY N/A 09/20/2017   Procedure: LEFT HEART CATH AND CORONARY ANGIOGRAPHY;  Surgeon: Wellington Hampshire, MD;  Location: [place] CV LAB;  Service: Cardiovascular;  Laterality: N/A;  . LEFT HEART CATH AND CORONARY ANGIOGRAPHY N/A 12/20/2017   Procedure: LEFT HEART CATH AND CORONARY ANGIOGRAPHY;  Surgeon: Wellington Hampshire, MD;  Location: [place] CV LAB;  Service: Cardiovascular;  Laterality: N/A;  . LEFT HEART CATH AND CORONARY ANGIOGRAPHY N/A 04/20/2019   Procedure: LEFT HEART CATH AND CORONARY ANGIOGRAPHY possible PCI;  Surgeon: Minna Merritts, MD;   Location: [place] CV LAB;  Service: Cardiovascular;  Laterality: N/A;  . MELANOMA EXCISION  2016   Dr. Evorn Gong  . [place]  2002  . RIGHT OOPHORECTOMY    . SHOULDER ARTHROSCOPY WITH OPEN ROTATOR CUFF REPAIR Right 01/07/2016   Procedure: SHOULDER ARTHROSCOPY WITH DEBRIDMENT, SUBACHROMIAL DECOMPRESSION;  Surgeon: Corky Mull, MD;  Location: [facility];  Service: Orthopedics;  Laterality: Right;  . SHOULDER ARTHROSCOPY WITH OPEN ROTATOR CUFF REPAIR Right 03/16/2017   Procedure: SHOULDER ARTHROSCOPY WITH OPEN ROTATOR CUFF REPAIR POSSIBLE BICEPS TENODESIS;  Surgeon: Corky Mull, MD;  Location: [facility];  Service: Orthopedics;  Laterality: Right;  . TRIGGER FINGER RELEASE Right     [place]     Home Meds: Prior to Admission medications   Medication Sig Start Date End Date Taking? Authorizing Provider  ALPRAZolam Duanne Moron) 1 MG tablet Take 1 tablet by mouth twice daily as needed Patient taking differently: Take 1 mg by mouth 2 (two) times daily as  needed for anxiety.  08/14/19  Yes [name], MD  amLODipine (NORVASC) 10 MG tablet Take 0.5 tablets (5 mg total) by mouth daily. 08/22/19  Yes Minna Merritts, MD  aspirin 81 MG EC tablet Take 81 mg by mouth daily.    Yes [provider]  buPROPion (WELLBUTRIN XL) 300 MG 24 hr tablet Take 1 tablet (300 mg total) by mouth daily. 03/15/19  Yes [name], MD  celecoxib (CELEBREX) 200 MG capsule Take 200 mg by mouth 2 (two) times daily as needed for mild pain or moderate pain.  08/10/19  Yes [provider]  Cholecalciferol 50 MCG (2000 UT) TABS Take 2,000 Units by mouth daily.   Yes [provider]  diphenoxylate-atropine (LOMOTIL) 2.5-0.025 MG tablet TAKE 1 TABLET BY MOUTH 1 TO 4 TIMES DAILY FOR DIARRHEA. Patient taking differently: Take 1 tablet by mouth 4 (four) times daily as needed for diarrhea or loose stools.  08/10/19  Yes Fisher, Kirstie Peri, MD  EUTHYROX 25 MCG tablet TAKE 1 TABLET BY MOUTH ONCE  DAILY BEFORE BREAKFAST Patient taking differently: Take 25 mcg by mouth daily before breakfast.  08/01/19  Yes [name], MD  gabapentin (NEURONTIN) 300 MG capsule Take 1 capsule (300 mg total) by mouth 2 (two) times daily. 10/12/18  Yes [name], MD  isosorbide mononitrate (IMDUR) 60 MG 24 hr tablet Take 1 tablet (60 mg total) by mouth daily. 05/05/19  Yes Gollan, Kathlene November, MD  losartan (COZAAR) 25 MG tablet Take 25 mg by mouth daily. 04/05/19  Yes [provider]  Multiple Vitamin (MULTI-VITAMIN) tablet Take 1 tablet by mouth daily.    Yes [provider]  pantoprazole (PROTONIX) 40 MG tablet Take 1 tablet by mouth twice daily Patient taking differently: Take 40 mg by mouth 2 (two) times daily.  06/06/19  Yes [name], MD  promethazine (PHENERGAN) 25 MG tablet Take 25 mg by mouth every 6 (six) hours as needed for nausea or vomiting.  07/25/19  Yes [provider]  propranolol ER (INDERAL LA) 60 MG 24 hr capsule Take 60 mg by mouth at bedtime. 06/05/19  Yes [provider]  traZODone (DESYREL) 100 MG tablet Take 100-200 mg by mouth at bedtime as needed for sleep.  03/06/19  Yes [provider]  venlafaxine XR (EFFEXOR-XR) 75 MG 24 hr capsule Take 2 capsules (150 mg total) by mouth daily with breakfast. 08/22/19  Yes Fisher, Kirstie Peri, MD  nitroGLYCERIN (NITROSTAT) 0.4 MG SL tablet Place 1 tablet (0.4 mg total) under the tongue every 5 (five) minutes as needed for chest pain. 04/24/19   Minna Merritts, MD    Inpatient Medications: Scheduled Meds: . amLODipine  5 mg Oral Daily  . aspirin EC  81 mg Oral Daily  . buPROPion  300 mg Oral Daily  . Chlorhexidine Gluconate Cloth  6 each Topical Daily  . clopidogrel  75 mg Oral Daily  . enoxaparin (LOVENOX) injection  40 mg Subcutaneous Q24H  . gabapentin  300 mg Oral BID  . isosorbide mononitrate  60 mg Oral Daily  . levothyroxine  25 mcg Oral QAC breakfast  . losartan  25 mg Oral Daily  .  pantoprazole  40 mg Oral BID  . scopolamine  1 patch Transdermal Q72H  . sodium chloride flush  3 mL Intravenous Once  . sodium chloride flush  3 mL Intravenous Q12H  . venlafaxine XR  150 mg Oral Q breakfast  Continuous Infusions: . dextrose 75 mL/hr at 09/07/19 1008   PRN Meds: acetaminophen **OR** acetaminophen, ALPRAZolam, HYDROcodone-acetaminophen, ondansetron **OR** ondansetron (ZOFRAN) IV, promethazine, senna-docusate, traZODone  Allergies:   Allergies  Allergen Reactions  . Lipitor [Atorvastatin] Other (See Comments)    Leg pains  . Tramadol Other (See Comments)    Mouth feels like it's on fire    Social History:   Social History   Socioeconomic History  . Marital status: Divorced    Spouse name: Not on file  . Number of children: 1  . Years of education: Not on file  . Highest education level: High school graduate  Occupational History  . 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 Disabled through 08/02/2013 per forms 02/03/2013  Tobacco Use  . Smoking status: Former Smoker    Types: Cigarettes    Quit date: 08/31/1994    Years since quitting: 25.0  . Smokeless tobacco: Never Used  . Tobacco comment: quit 28 years ago  Vaping Use  . Vaping Use: Never used  Substance and Sexual Activity  . Alcohol use: No    Alcohol/week: 0.0 standard drinks  . Drug use: No  . Sexual activity: Not Currently    Birth control/protection: None, Surgical    Comment: Ablation  Other Topics Concern  . 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   Lives at home with son   Social Determinants of Health   Financial Resource Strain: Low Risk   . Difficulty of Paying Living Expenses: Not very hard  Food Insecurity: No Food Insecurity  . Worried About Charity fundraiser in the Last Year: Never true    . Ran Out of Food in the Last Year: Never true  Transportation Needs: No Transportation Needs  . Lack of Transportation (Medical): No  . Lack of Transportation (Non-Medical): No  Physical Activity: Inactive  . Days of Exercise per Week: 0 days  . Minutes of Exercise per Session: 0 min  Stress: Stress Concern Present  . Feeling of Stress : Very much  Social Connections: Moderately Isolated  . Frequency of Communication with Friends and Family: More than three times a week  . Frequency of Social Gatherings with Friends and Family: More than three times a week  . Attends Religious Services: More than 4 times per year  . Active Member of Clubs or Organizations: No  . Attends Archivist Meetings: Never  . Marital Status: Divorced  Human resources officer Violence: Not At Risk  . Fear of Current or Ex-Partner: No  . Emotionally Abused: No  . Physically Abused: No  . Sexually Abused: No     Family History:   Family History  Problem Relation Age of Onset  . Hypertension Mother   . Anxiety disorder Mother   . Depression Mother   . Bipolar disorder Mother   . Heart disease Mother        No details  . Hyperlipidemia Mother   . Kidney disease Father   . Heart disease Father 10  . Hypertension Father   . Diabetes Father   . Stroke Father   . Colon cancer Father        dx in his 24's  . Anxiety disorder Father   . Depression Father   . Skin cancer Father   .  Kidney disease Sister   . Thyroid nodules Sister   . Hypertension Sister   . Hypertension Sister   . Diabetes Sister   . Hyperlipidemia Sister   . Depression Sister   . Breast cancer Maternal Aunt 69  . Breast cancer Maternal Aunt 59  . Ovarian cancer Cousin   . Colon cancer Cousin   . Kidney cancer Cousin   . Breast cancer Other   . Bladder Cancer Neg Hx     ROS:  Review of Systems  Constitutional: Positive for malaise/fatigue. Negative for chills, diaphoresis, fever and weight loss.  HENT: Negative for  congestion.   Eyes: Negative for discharge and redness.  Respiratory: Negative for cough, sputum production, shortness of breath and wheezing.   Cardiovascular: Positive for chest pain. Negative for palpitations, orthopnea, claudication, leg swelling and PND.  Gastrointestinal: Negative for abdominal pain, heartburn, nausea and vomiting.       Dysphagia  Musculoskeletal: Negative for falls and myalgias.  Skin: Negative for rash.  Neurological: Positive for weakness. Negative for dizziness, tingling, tremors, sensory change, speech change, focal weakness and loss of consciousness.  Endo/Heme/Allergies: Does not bruise/bleed easily.  Psychiatric/Behavioral: Negative for substance abuse. The patient is not nervous/anxious.   All other systems reviewed and are negative.     Physical Exam/Data:   Vitals:   09/08/19 0014 09/08/19 0504 09/08/19 0750 09/08/19 1148  BP: (!) 172/78 133/77 (!) 156/77 129/82  Pulse: 71 72 66 83  Resp: '15 15 17 17  ' Temp: 98 F (36.7 C) 98.5 F (36.9 C) 97.7 F (36.5 C) 98.4 F (36.9 C)  TempSrc: Oral Oral Oral Oral  SpO2: 100% 100% 92% 99%  Weight:      Height:       No intake or output data in the 24 hours ending 09/08/19 1214 Filed Weights   09/02/19 1855 09/03/19 1252  Weight: 65.8 kg 67 kg   Body mass index is 27.02 kg/m.   Physical Exam: General: Well developed, well nourished, in no acute distress.  Resting comfortably in bed. Head: Normocephalic, atraumatic, sclera non-icteric, no xanthomas, nares without discharge.  Neck: Negative for carotid bruits. JVD not elevated. Lungs: Clear bilaterally to auscultation without wheezes, rales, or rhonchi. Breathing is unlabored. Heart: RRR with S1 S2. No murmurs, rubs, or gallops appreciated. Abdomen: Soft, non-tender, non-distended with normoactive bowel sounds. No hepatomegaly. No rebound/guarding. No obvious abdominal masses. Msk:  Strength and tone appear normal for age. Extremities: No clubbing or  cyanosis. No edema. Distal pedal pulses are 2+ and equal bilaterally. Neuro: Alert and oriented X 3. No facial asymmetry. No focal deficit. Moves all extremities spontaneously. Psych:  Responds to questions appropriately with a normal affect.   EKG:  The EKG was personally reviewed and demonstrates: NSR, 98 bpm, no acute ST-T changes Telemetry:  Telemetry was personally reviewed and demonstrates: Not on telemetry  Weights: Filed Weights   09/02/19 1855 09/03/19 1252  Weight: 65.8 kg 67 kg    Relevant CV Studies:  LHC 04/20/2019: Coronary dominance: Right  Left mainstem:   Large vessel that bifurcates into the LAD and left circumflex, no significant disease noted  Left anterior descending (LAD):   Large vessel that extends to the apical region, diagonal branch #1 of moderate size with patent stent , diagonal #2 small to moderate in size with severe ostial/proximal disease, severe distal disease estimated 80%  Left circumflex (LCx):  Large vessel with OM branch 2, mild to moderate mid vessel disease  Right coronary  artery (RCA):  Right dominant vessel with PL and PDA, no significant disease noted , very mild mid vessel disease  Left ventriculography: Left ventricular systolic function is normal, LVEF is estimated at 55-65%, there is no significant mitral regurgitation , no significant aortic valve stenosis  Final Conclusions:   In comparison to images from catheterization 2019 no significant change Severe native coronary disease, Patent stents LAD and diagonal vessel Stable severe ostial/proximal diagonal #2 disease, no change from previous catheterization 2019, not amenable to stenting Distal LAD is small and tapers, severe disease, not amenable to stenting  Recommendations:  Continue aggressive medical management, LDL less than 70, aggressive diabetes control, antianginals Of note left ventricular end-diastolic pressure was low consistent with dehydration, would continue IV  fluids __________  2D echo 01/2019: 1. Left ventricular ejection fraction, by visual estimation, is 60 to  65%. The left ventricle has normal function. There is no left ventricular  hypertrophy.  2. Left ventricular diastolic parameters are consistent with Grade I  diastolic dysfunction (impaired relaxation).  3. The left ventricle has no regional wall motion abnormalities.  4. Global right ventricle has normal systolic function.The right  ventricular size is normal. No increase in right ventricular wall  thickness.  5. Left atrial size was normal.  6. TR signal is inadequate for assessing pulmonary artery systolic  pressure.    Laboratory Data:  Chemistry Recent Labs  Lab 09/06/19 0640 09/07/19 0536 09/08/19 0713  NA 132* 135 136  K 3.6 4.3 4.3  CL 100 102 104  CO2 '25 26 24  ' GLUCOSE 109* 80 90  BUN '12 14 18  ' CREATININE 1.13* 1.22* 1.40*  CALCIUM 8.0* 8.9 8.5*  GFRNONAA 57* 52* 44*  GFRAA >60 60* 51*  ANIONGAP '7 7 8    ' Recent Labs  Lab 09/03/19 0513  PROT 6.8  ALBUMIN 3.5  AST 61*  ALT 61*  ALKPHOS 125  BILITOT 0.6   Hematology Recent Labs  Lab 09/06/19 0640 09/07/19 0536 09/08/19 0713  WBC 7.7 8.7 10.5  RBC 4.06 4.44 4.22  HGB 11.4* 12.4 11.7*  HCT 32.7* 36.6 35.8*  MCV 80.5 82.4 84.8  MCH 28.1 27.9 27.7  MCHC 34.9 33.9 32.7  RDW 13.8 13.9 14.0  PLT 171 177 170   Cardiac EnzymesNo results for input(s): TROPONINI in the last 168 hours. No results for input(s): TROPIPOC in the last 168 hours.  BNPNo results for input(s): BNP, PROBNP in the last 168 hours.  DDimer No results for input(s): DDIMER in the last 168 hours.  Radiology/Studies:  MR ABDOMEN W WO CONTRAST  Result Date: 09/06/2019 IMPRESSION: 1. No evidence of pancreatic mass or other pathologic mass in the abdomen. 2. Expected postsurgical changes from Roux-en-Y gastric bypass surgery. 3. Cholecystectomy. No biliary ductal dilatation. No choledocholithiasis. Electronically Signed   By:  Ilona Sorrel M.D.   On: 09/06/2019 08:15   DG Chest Port 1 View  Result Date: 09/08/2019 IMPRESSION: 1.  Mild right base subsegmental atelectasis. 2. Right-sided aortic arch again noted. Heart size stable. No pulmonary venous congestion. Electronically Signed   By: Marcello Moores  Register   On: 09/08/2019 09:39    Assessment and Plan:   1.  CAD involving the native coronary arteries with chronic angina: -She most recently underwent LHC in 04/2019 which showed patent stents in the LAD and diagonal with stable severe ostial to proximal D2 disease that was unchanged from cath in 2019 and not amenable to stenting.  Her distal LAD was small and  tapered to severe disease which also was not amenable to stenting.  Medical management was advised -Patient has continued to have intermittent chronic angina with nonacute EKG this admission and high-sensitivity troponins negative x5 dating back to her ED visit from 7/30 -No indication for heparin drip -Increase Imdur to 90 mg daily -She indicates Ranexa does not help -Continue amlodipine for antianginal effect along with aspirin, clopidogrel, and losartan -If she continues to have angina despite maximally tolerated medical therapy, she may need repeat LHC with consideration for complex PCI of the LAD/D1 bifurcation. This is less than ideal given the actual stenosis involves the small diagonal branch with increased risk for needing to stent the LAD following intervention on the D1 as the LAD demonstrates no significant stenosis at this location -Place patient on telemetry  2. HFrEF secondary to ICM: -Subsequent normalization of LV systolic function as outlined above -Beta-blocker has been held at this time secondary to bradycardia with most recent heart rates improved, resume when able -Continue losartan -With most recent EF normalized no indication for further escalation of GDMT at this time  3.  CKD stage III: -Stable  4.  HTN: -Blood pressure well controlled  currently -Continue current medications  5.  HLD: -LDL 76 in 04/2019 -Recommend resumption of PTA Crestor, she indicates he has stopped taking this at home   For questions or updates, please contact [place] Please consult [email or web address] for contact info under Cardiology/STEMI.   Signed, Christell Faith, PA-C [facility] Pager: [phone] 09/08/2019, 12:14 PM

## 2019-09-08 NOTE — Progress Notes (Signed)
Per pt had small amt of emesis after supper, PRN zofran given per orders

## 2019-09-09 LAB — BASIC METABOLIC PANEL
Anion gap: 11 (ref 5–15)
BUN: 13 mg/dL (ref 6–20)
CO2: 24 mmol/L (ref 22–32)
Calcium: 8.6 mg/dL — ABNORMAL LOW (ref 8.9–10.3)
Chloride: 99 mmol/L (ref 98–111)
Creatinine, Ser: 1.18 mg/dL — ABNORMAL HIGH (ref 0.44–1.00)
GFR calc Af Amer: >60 mL/min (ref >60)
GFR calc non Af Amer: 54 mL/min — ABNORMAL LOW (ref >60)
Glucose, Bld: 181 mg/dL — ABNORMAL HIGH (ref 70–99)
Potassium: 4.2 mmol/L (ref 3.5–5.1)
Sodium: 134 mmol/L — ABNORMAL LOW (ref 135–145)

## 2019-09-09 LAB — GLUCOSE, CAPILLARY
Glucose-Capillary: 72 mg/dL (ref 70–99)
Glucose-Capillary: 69 mg/dL — ABNORMAL LOW (ref 70–99)
Glucose-Capillary: 86 mg/dL (ref 70–99)
Glucose-Capillary: 90 mg/dL (ref 70–99)
Glucose-Capillary: 114 mg/dL — ABNORMAL HIGH (ref 70–99)
Glucose-Capillary: 200 mg/dL — ABNORMAL HIGH (ref 70–99)

## 2019-09-09 MED ORDER — CARVEDILOL 3.125 MG PO TABS
3.1250 mg | ORAL_TABLET | Freq: Two times a day (BID) | ORAL | Status: DC
  Administered 2019-09-09 – 2019-09-10 (×2): 3.125 mg via ORAL
  Filled 2019-09-09 (×2): qty 1

## 2019-09-09 MED ORDER — ALPRAZOLAM 1 MG PO TABS
1.0000 mg | ORAL_TABLET | Freq: Three times a day (TID) | ORAL | Status: DC | PRN
  Administered 2019-09-09 – 2019-09-11 (×6): 1 mg via ORAL
  Filled 2019-09-09 (×6): qty 1

## 2019-09-09 NOTE — Progress Notes (Signed)
PROGRESS NOTE  [name] ELF:[id number] DOB: Feb 22, 1970 DOA: 09/02/2019 PCP: [name], MD  HPI/Recap of past 24 hours: HPI from Dr Rowe Clack [name] is a 49 y.o. female with medical history significant for history of type 2 diabetes mellitus no longer on medications for this, history of gastric bypass, depression and anxiety, coronary artery disease, history of CVA, and hypothyroidism, now presenting to the emergency department with recurrent hypoglycemia.  Patient reports suffering from pain with swallowing for several weeks now, has discomfort swallowing both liquids and solids but denies any choking, cough, or shortness of breath.  She denies any fevers or chills.  Reports that the odynophagia had been evaluated by GI with upper endoscopy that she reports to have been unremarkable.  Patient denies using alcohol or any diabetes medications recently.  She was seen in the emergency department yesterday with chest discomfort, had normal at bedtime troponin x3, nonischemic EKG, and no acute findings on chest x-ray. In the ED, patient is found to be afebrile, saturating well on room air, slightly bradycardic, and with blood pressure 190/76.  EKG features a normal sinus rhythm.  Chemistry panel notable for glucose of 62 creatinine 1.39, similar to priors.  CBC with leukocytosis to 12,400.  Urine was sent for culture and she was given a dose of Rocephin for bacteriuria, and also treated with IV dextrose, but continues to have hypoglycemia despite this.     Today, pt frustrated about her medical condition, recurrent chest pain and hypoglycemia.  Of note, patient is edentulous and unable to eat most foods, which may be contributing to her poor appetite and hypoglycemia.  Left her dentures at home, encouraged her to let her son bring in the dentures.    Assessment/Plan: Principal Problem:   Hypoglycemia Active Problems:   Depression with anxiety   Essential hypertension    Stroke (HCC)   Hypothyroidism   CAD (coronary artery disease)   Chronic kidney disease, stage 3a   Recurrent hypoglycemia Patient is edentulous and unable to eat most foods, which may be contributing to her poor appetite and hypoglycemia Hx of DM2 with A1c of 4.8, history of peripheral neuropathy Not on any for DM for the past 2 months or more ??Propanolol possible cause for recurrent hypoglycemia Poor appetite due to odynophagia Insulin and C-peptide levels are both elevated Proinsulin, sulfonylurea panel pending MRI abdomen protocol with no evidence of pancreatic mass or other pathologic mass in the abdomen Continue IV dextrose, reduce rate to 75 cc/h, plan to wean off and see what her CBGs are like Continue gabapentin Hold propanolol for now Monitor closely, CBGs every 4 hours  CAD Complex cardiac history, complained of chest pain radiating to left shoulder on 09/08/2019 Troponins negative, EKG with no acute ST changes Last cath in 04/2019 Cardiology consulted Continue aspirin, Plavix, losartan, amlodipine, Imdur, statins, started low-dose Coreg Telemetry  Odynophagia/edentulous Several weeks of pain with swallowing liquids and solids EGD unremarkable Scheduled outpatient to see ENT again  Noted bradycardia Likely 2/2 beta-blocker, currently asymptomatic Hold propanolol for now (was prescribed by neurology for tremors) Monitor closely  CKD stage IIIa Creatinine at baseline Daily BMP  Hypertension Continue losartan, amlodipine, Imdur, hold propanolol  Hypothyroidism Continue Synthroid  Early dementia Stable  Depression Continue bupropion, venlafaxine        Malnutrition Type:      Malnutrition Characteristics:      Nutrition Interventions:       Estimated body mass index is 27.02  kg/m as calculated from the following:   Height as of this encounter: 5\' 2"  (1.575 m).   Weight as of this encounter: 67 kg.     Code Status: Full  Family  Communication: None at bedside  Disposition Plan: Status is: Inpatient  Remains inpatient appropriate because:Persistent severe electrolyte disturbances and Inpatient level of care appropriate due to severity of illness   Dispo: The patient is from: Home              Anticipated d/c is to: Home              Anticipated d/c date is: 2 days              Patient currently is not medically stable to d/c.    Consultants:  Cardiology  Procedures:  None  Antimicrobials:  None  DVT prophylaxis: Lovenox   Objective: Vitals:   09/09/19 0413 09/09/19 0732 09/09/19 1149 09/09/19 1613  BP: (!) 150/69 (!) 144/59 132/67 (!) 139/94  Pulse: 62 64 75 80  Resp: 16 17 17 17   Temp: 98.3 F (36.8 C) 98.9 F (37.2 C) 98.1 F (36.7 C) 97.9 F (36.6 C)  TempSrc: Oral Oral    SpO2: 98% 100% 99% 100%  Weight:      Height:       No intake or output data in the 24 hours ending 09/09/19 1644 Filed Weights   09/02/19 1855 09/03/19 1252  Weight: 65.8 kg 67 kg    Exam:  General: NAD   Cardiovascular: S1, S2 present  Respiratory: CTAB  Abdomen: Soft, nontender, nondistended, bowel sounds present  Musculoskeletal: No bilateral pedal edema noted  Skin: Normal  Psychiatry: Normal mood   Data Reviewed: CBC: Recent Labs  Lab 09/04/19 0459 09/05/19 0610 09/06/19 0640 09/07/19 0536 09/08/19 0713  WBC 12.6* 9.1 7.7 8.7 10.5  HGB 12.0 12.2 11.4* 12.4 11.7*  HCT 36.0 34.9* 32.7* 36.6 35.8*  MCV 83.1 81.2 80.5 82.4 84.8  PLT 215 163 171 177 160   Basic Metabolic Panel: Recent Labs  Lab 09/05/19 0610 09/06/19 0640 09/07/19 0536 09/08/19 0713 09/09/19 0913  NA 131* 132* 135 136 134*  K 3.9 3.6 4.3 4.3 4.2  CL 101 100 102 104 99  CO2 23 25 26 24 24   GLUCOSE 95 109* 80 90 181*  BUN 11 12 14 18 13   CREATININE 1.13* 1.13* 1.22* 1.40* 1.18*  CALCIUM 8.3* 8.0* 8.9 8.5* 8.6*   GFR: Estimated Creatinine Clearance: 51.2 mL/min (A) (by C-G formula based on SCr of 1.18  mg/dL (H)). Liver Function Tests: Recent Labs  Lab 09/03/19 0513  AST 61*  ALT 61*  ALKPHOS 125  BILITOT 0.6  PROT 6.8  ALBUMIN 3.5   No results for input(s): LIPASE, AMYLASE in the last 168 hours. No results for input(s): AMMONIA in the last 168 hours. Coagulation Profile: No results for input(s): INR, PROTIME in the last 168 hours. Cardiac Enzymes: No results for input(s): CKTOTAL, CKMB, CKMBINDEX, TROPONINI in the last 168 hours. BNP (last 3 results) No results for input(s): PROBNP in the last 8760 hours. HbA1C: No results for input(s): HGBA1C in the last 72 hours. CBG: Recent Labs  Lab 09/09/19 0414 09/09/19 0817 09/09/19 0836 09/09/19 1148 09/09/19 1611  GLUCAP 72 69* 86 90 114*   Lipid Profile: No results for input(s): CHOL, HDL, LDLCALC, TRIG, CHOLHDL, LDLDIRECT in the last 72 hours. Thyroid Function Tests: No results for input(s): TSH, T4TOTAL, FREET4, T3FREE, THYROIDAB in the last  72 hours. Anemia Panel: No results for input(s): VITAMINB12, FOLATE, FERRITIN, TIBC, IRON, RETICCTPCT in the last 72 hours. Urine analysis:    Component Value Date/Time   COLORURINE YELLOW (A) 09/02/2019 1927   APPEARANCEUR HAZY (A) 09/02/2019 1927   APPEARANCEUR Cloudy (A) 07/17/2014 1455   LABSPEC 1.009 09/02/2019 1927   LABSPEC 1.012 12/29/2013 1210   PHURINE 5.0 09/02/2019 1927   GLUCOSEU NEGATIVE 09/02/2019 1927   GLUCOSEU >=500 12/29/2013 1210   HGBUR NEGATIVE 09/02/2019 1927   HGBUR negative 04/14/2010 1435   BILIRUBINUR NEGATIVE 09/02/2019 1927   BILIRUBINUR small 08/22/2019 1014   BILIRUBINUR Negative 07/17/2014 1455   BILIRUBINUR Negative 12/29/2013 1210   [place] 09/02/2019 1927   PROTEINUR NEGATIVE 09/02/2019 1927   UROBILINOGEN 0.2 08/22/2019 1014   UROBILINOGEN negative 04/14/2010 1435   NITRITE NEGATIVE 09/02/2019 1927   LEUKOCYTESUR LARGE (A) 09/02/2019 1927   LEUKOCYTESUR Negative 12/29/2013 1210   Sepsis  Labs: @LABRCNTIP (procalcitonin:4,lacticidven:4)  ) Recent Results (from the past 240 hour(s))  SARS Coronavirus 2 by RT PCR (hospital order, performed in [place] hospital lab) Nasopharyngeal Nasopharyngeal Swab     Status: None   Collection Time: 09/01/19  3:27 PM   Specimen: Nasopharyngeal Swab  Result Value Ref Range Status   SARS Coronavirus 2 NEGATIVE NEGATIVE Final    Comment: (NOTE) SARS-CoV-2 target nucleic acids are NOT DETECTED.  The SARS-CoV-2 RNA is generally detectable in upper and lower respiratory specimens during the acute phase of infection. The lowest concentration of SARS-CoV-2 viral copies this assay can detect is 250 copies / mL. A negative result does not preclude SARS-CoV-2 infection and should not be used as the sole basis for treatment or other patient management decisions.  A negative result may occur with improper specimen collection / handling, submission of specimen other than nasopharyngeal swab, presence of viral mutation(s) within the areas targeted by this assay, and inadequate number of viral copies (<250 copies / mL). A negative result must be combined with clinical observations, patient history, and epidemiological information.  Fact Sheet for Patients:   StrictlyIdeas.no  Fact Sheet for Healthcare Providers: BankingDealers.co.za  This test is not yet approved or  cleared by the [place] FDA and has been authorized for detection and/or diagnosis of SARS-CoV-2 by FDA under an Emergency Use Authorization (EUA).  This EUA will remain in effect (meaning this test can be used) for the duration of the COVID-19 declaration under Section 564(b)(1) of the Act, 21 U.S.C. section 360bbb-3(b)(1), unless the authorization is terminated or revoked sooner.  Performed at [facility], [address] [place], [place] [address]   Urine Culture     Status: Abnormal   Collection Time: 09/02/19   7:27 PM   Specimen: Urine, Random  Result Value Ref Range Status   Specimen Description   Final    URINE, RANDOM Performed at [facility], [address] [place], [place] [address]    Special Requests   Final    NONE Performed at [facility], [address] [place], [place] [address]    Culture MULTIPLE SPECIES PRESENT, SUGGEST RECOLLECTION (A)  Final   Report Status 09/04/2019 FINAL  Final  MRSA PCR Screening     Status: Abnormal   Collection Time: 09/03/19 12:51 PM   Specimen: Nasopharyngeal  Result Value Ref Range Status   MRSA by PCR POSITIVE (A) NEGATIVE Final    Comment:        The GeneXpert MRSA Assay (FDA approved for NASAL  specimens only), is one component of a comprehensive MRSA colonization surveillance program. It is not intended to diagnose MRSA infection nor to guide or monitor treatment for MRSA infections. RESULT CALLED TO, READ BACK BY AND VERIFIED WITH: Talbot Grumbling RN AT 6728 ON 09/03/19 [facility] Performed at [facility], [address] [place], [place] [address]       Studies: No results found.  Scheduled Meds: . amLODipine  5 mg Oral Daily  . aspirin EC  81 mg Oral Daily  . buPROPion  300 mg Oral Daily  . carvedilol  3.125 mg Oral BID WC  . Chlorhexidine Gluconate Cloth  6 each Topical Daily  . clopidogrel  75 mg Oral Daily  . enoxaparin (LOVENOX) injection  40 mg Subcutaneous Q24H  . gabapentin  300 mg Oral BID  . isosorbide mononitrate  90 mg Oral Daily  . levothyroxine  25 mcg Oral QAC breakfast  . losartan  25 mg Oral Daily  . pantoprazole  40 mg Oral BID  . rosuvastatin  5 mg Oral q1800  . scopolamine  1 patch Transdermal Q72H  . sodium chloride flush  3 mL Intravenous Once  . sodium chloride flush  3 mL Intravenous Q12H  . venlafaxine XR  150 mg Oral Q breakfast    Continuous Infusions: . dextrose 75 mL/hr at 09/09/19 0451     LOS: 6 days     Alma Friendly, MD [facility]  If  7PM-7AM, please contact night-coverage [email or web address] 09/09/2019, 4:44 PM

## 2019-09-09 NOTE — Progress Notes (Signed)
Progress Note  Patient Name: [name] Date of Encounter: 09/09/2019  [facility] Cardiologist: Ida Rogue, MD   Subjective   Tearful.  Frustrated by her continued hypoglycemia and chest pain.  Inpatient Medications    Scheduled Meds: . amLODipine  5 mg Oral Daily  . aspirin EC  81 mg Oral Daily  . buPROPion  300 mg Oral Daily  . carvedilol  3.125 mg Oral BID WC  . Chlorhexidine Gluconate Cloth  6 each Topical Daily  . clopidogrel  75 mg Oral Daily  . enoxaparin (LOVENOX) injection  40 mg Subcutaneous Q24H  . gabapentin  300 mg Oral BID  . isosorbide mononitrate  90 mg Oral Daily  . levothyroxine  25 mcg Oral QAC breakfast  . losartan  25 mg Oral Daily  . pantoprazole  40 mg Oral BID  . rosuvastatin  5 mg Oral q1800  . scopolamine  1 patch Transdermal Q72H  . sodium chloride flush  3 mL Intravenous Once  . sodium chloride flush  3 mL Intravenous Q12H  . venlafaxine XR  150 mg Oral Q breakfast   Continuous Infusions: . dextrose 75 mL/hr at 09/09/19 0451   PRN Meds: acetaminophen **OR** acetaminophen, ALPRAZolam, HYDROcodone-acetaminophen, ondansetron **OR** ondansetron (ZOFRAN) IV, promethazine, senna-docusate, traZODone   Vital Signs    Vitals:   09/08/19 2351 09/09/19 0413 09/09/19 0732 09/09/19 1149  BP: (!) 151/77 (!) 150/69 (!) 144/59 132/67  Pulse: 64 62 64 75  Resp: 16 16 17 17   Temp: 98.2 F (36.8 C) 98.3 F (36.8 C) 98.9 F (37.2 C) 98.1 F (36.7 C)  TempSrc: Oral Oral Oral   SpO2: 100% 98% 100% 99%  Weight:      Height:        Intake/Output Summary (Last 24 hours) at 09/09/2019 1309 Last data filed at 09/08/2019 1356 Gross per 24 hour  Intake 3025.96 ml  Output --  Net 3025.96 ml   Last 3 Weights 09/03/2019 09/02/2019 09/01/2019  Weight (lbs) 147 lb 11.3 oz 145 lb 145 lb  Weight (kg) 67 kg 65.772 kg 65.772 kg  Some encounter information is confidential and restricted. Go to Review Flowsheets activity to see all data.       Telemetry    Sinus rhythm, sinus tachycardia.- Personally Reviewed  ECG    09/09/2019: Sinus rhythm.  Rate 98 bpm.  Nonspecific T wave abnormalities. - Personally Reviewed  Physical Exam   VS:  BP 132/67 (BP Location: Left Arm)   Pulse 75   Temp 98.1 F (36.7 C)   Resp 17   Ht 5\' 2"  (1.575 m)   Wt 67 kg   SpO2 99%   BMI 27.02 kg/m  , BMI Body mass index is 27.02 kg/m. GENERAL:  Well appearing HEENT: Pupils equal round and reactive, fundi not visualized, oral mucosa unremarkable NECK:  No jugular venous distention, waveform within normal limits, carotid upstroke brisk and symmetric, no bruits, no thyromegaly LUNGS:  Clear to auscultation bilaterally HEART:  RRR.  PMI not displaced or sustained,S1 and S2 within normal limits, no S3, no S4, no clicks, no rubs, no murmurs ABD:  Flat, positive bowel sounds normal in frequency in pitch, no bruits, no rebound, no guarding, no midline pulsatile mass, no hepatomegaly, no splenomegaly EXT:  2 plus pulses throughout, no edema, no cyanosis no clubbing SKIN:  No rashes no nodules NEURO:  Cranial nerves II through XII grossly intact, motor grossly intact throughout PSYCH:  Cognitively intact, oriented to  person place and time   Labs    High Sensitivity Troponin:   Recent Labs  Lab 09/01/19 0817 09/01/19 1035 09/01/19 1657 09/08/19 0713 09/08/19 1114  TROPONINIHS 9 8 7 8 8       Chemistry Recent Labs  Lab 09/03/19 0513 09/04/19 0459 09/07/19 0536 09/08/19 0713 09/09/19 0913  NA  --    < > 135 136 134*  K  --    < > 4.3 4.3 4.2  CL  --    < > 102 104 99  CO2  --    < > 26 24 24   GLUCOSE  --    < > 80 90 181*  BUN  --    < > 14 18 13   CREATININE  --    < > 1.22* 1.40* 1.18*  CALCIUM  --    < > 8.9 8.5* 8.6*  PROT 6.8  --   --   --   --   ALBUMIN 3.5  --   --   --   --   AST 61*  --   --   --   --   ALT 61*  --   --   --   --   ALKPHOS 125  --   --   --   --   BILITOT 0.6  --   --   --   --   GFRNONAA  --    < > 52*  44* 54*  GFRAA  --    < > 60* 51* >60  ANIONGAP  --    < > 7 8 11    < > = values in this interval not displayed.     Hematology Recent Labs  Lab 09/06/19 0640 09/07/19 0536 09/08/19 0713  WBC 7.7 8.7 10.5  RBC 4.06 4.44 4.22  HGB 11.4* 12.4 11.7*  HCT 32.7* 36.6 35.8*  MCV 80.5 82.4 84.8  MCH 28.1 27.9 27.7  MCHC 34.9 33.9 32.7  RDW 13.8 13.9 14.0  PLT 171 177 170    BNPNo results for input(s): BNP, PROBNP in the last 168 hours.   DDimer No results for input(s): DDIMER in the last 168 hours.   Radiology    DG Chest Port 1 View  Result Date: 09/08/2019 CLINICAL DATA:  Hypoglycemia.  Shortness of breath. EXAM: PORTABLE CHEST 1 VIEW COMPARISON:  04/18/2019.  10/12/2018. FINDINGS: Mediastinum and hilar structures normal. Heart size stable. Right-sided aortic arch again noted. No pulmonary venous congestion. Low lung volumes. Mild right base subsegmental atelectasis. No focal alveolar infiltrate. No pleural effusion or pneumothorax. Surgical clips noted over the left upper chest. Degenerative change thoracic spine. IMPRESSION: 1.  Mild right base subsegmental atelectasis. 2. Right-sided aortic arch again noted. Heart size stable. No pulmonary venous congestion. Electronically Signed   By: Marcello Moores  Register   On: 09/08/2019 09:39    Cardiac Studies   LHC 12/2017:  Non-stenotic 1st Diag lesion was previously treated.  Ost 1st Diag lesion is 40% stenosed.  Mid LAD-2 lesion is 5% stenosed.  Ost 2nd Diag to 2nd Diag lesion is 95% stenosed.  Mid LAD-1 lesion is 30% stenosed.  Dist LAD lesion is 80% stenosed.  Prox Cx lesion is 40% stenosed.  Mid RCA lesion is 30% stenosed.   Echo 01/2019: 1. Left ventricular ejection fraction, by visual estimation, is 60 to  65%. The left ventricle has normal function. There is no left ventricular  hypertrophy.  2. Left ventricular diastolic parameters are consistent  with Grade I  diastolic dysfunction (impaired relaxation).  3. The  left ventricle has no regional wall motion abnormalities.  4. Global right ventricle has normal systolic function.The right  ventricular size is normal. No increase in right ventricular wall  thickness.  5. Left atrial size was normal.  6. TR signal is inadequate for assessing pulmonary artery systolic  pressure.   Patient Profile     49 y.o. female with CAD s/p LAD/D1/D2 PCI, CKD III, chronic systolic and diastolic heart failure with normalization of LVEF, prior stroke, left carotid to subclavian artery bypass, aortic arch aneurysm, chronic anemia, obesity status post gastric bypass, bipolar disorder, diabetes, hypertension, hyperlipidemia admitted with chest pain and hypoglycemia.  Assessment & Plan    # CAD s/p PCI: # Obstructive CAD:  # Chronic angina:  [name] has known 85% distal LAD lesions as well as 95% in ostial D2.  The LAD is too distal to stent and the ostial diagonal has been treated twice with balloon angioplasty with recurrent restenosis.  Its not in a very good location for repeat PCI.  She had a stress test that was negative for ischemia 02/2019.  She is being medically managed.  In the past she has had some issues with bradycardia.  We will start low-dose carvedilol 3.125 mg twice daily.  Monitor telemetry closely.  Continue aspirin, clopidogrel, amlodipine, and Imdur.  Imdur was increased this admission.  Could also consider Ranexa if this would be affordable or if she is unable to tolerate beta-blockers.  Cardiac enzymes have been negative and there are no acute ischemic changes on EKG.  No plan for repeat ischemia evaluation at this time.       For questions or updates, please contact [place] Please consult [email or web address] for contact info under        Signed, Skeet Latch, MD  09/09/2019, 1:09 PM

## 2019-09-10 LAB — GLUCOSE, CAPILLARY
Glucose-Capillary: 109 mg/dL — ABNORMAL HIGH (ref 70–99)
Glucose-Capillary: 130 mg/dL — ABNORMAL HIGH (ref 70–99)
Glucose-Capillary: 208 mg/dL — ABNORMAL HIGH (ref 70–99)
Glucose-Capillary: 64 mg/dL — ABNORMAL LOW (ref 70–99)
Glucose-Capillary: 66 mg/dL — ABNORMAL LOW (ref 70–99)
Glucose-Capillary: 75 mg/dL (ref 70–99)
Glucose-Capillary: 80 mg/dL (ref 70–99)
Glucose-Capillary: 97 mg/dL (ref 70–99)

## 2019-09-10 LAB — PROINSULIN/INSULIN RATIO
Insulin: 73 u[IU]/mL — ABNORMAL HIGH
Proinsulin/Insulin Ratio: 22 %
Proinsulin: 107 pmol/L

## 2019-09-10 MED ORDER — CARVEDILOL 3.125 MG PO TABS
6.2500 mg | ORAL_TABLET | Freq: Two times a day (BID) | ORAL | Status: DC
  Administered 2019-09-10 – 2019-09-12 (×4): 6.25 mg via ORAL
  Filled 2019-09-10 (×4): qty 2

## 2019-09-10 NOTE — Progress Notes (Signed)
PROGRESS NOTE  [name] OEU:[id number] DOB: 09/24/1970 DOA: 09/02/2019 PCP: [name], MD  HPI/Recap of past 24 hours: HPI from Dr [name] is a 49 y.o. female with medical history significant for history of type 2 diabetes mellitus no longer on medications for this, history of gastric bypass, depression and anxiety, coronary artery disease, history of CVA, and hypothyroidism, now presenting to the emergency department with recurrent hypoglycemia.  Patient reports suffering from pain with swallowing for several weeks now, has discomfort swallowing both liquids and solids but denies any choking, cough, or shortness of breath.  She denies any fevers or chills.  Reports that the odynophagia had been evaluated by GI with upper endoscopy that she reports to have been unremarkable.  Patient denies using alcohol or any diabetes medications recently.  She was seen in the emergency department yesterday with chest discomfort, had normal at bedtime troponin x3, nonischemic EKG, and no acute findings on chest x-ray. In the ED, patient is found to be afebrile, saturating well on room air, slightly bradycardic, and with blood pressure 190/76.  EKG features a normal sinus rhythm.  Chemistry panel notable for glucose of 62 creatinine 1.39, similar to priors.  CBC with leukocytosis to 12,400.  Urine was sent for culture and she was given a dose of Rocephin for bacteriuria, and also treated with IV dextrose, but continues to have hypoglycemia despite this.    Today, patient continues to complain of chest pain at rest overnight, with exertional dyspnea upon ambulating to the bathroom and back.  Advised for her son to bring her dentures, of which he did, unable patient eat and snack adequately throughout the day.  Noted significant anxiety episode yesterday evening    Assessment/Plan: Principal Problem:   Hypoglycemia Active Problems:   Depression with anxiety   Essential  hypertension   Stroke (HCC)   Hypothyroidism   CAD (coronary artery disease)   Chronic kidney disease, stage 3a   Recurrent hypoglycemia Patient is edentulous and unable to eat most foods, which may be contributing to her poor appetite (painful gums) and hypoglycemia Hx of DM2 with A1c of 4.8, history of peripheral neuropathy Not on any for DM for the past 2 months or more ??Propanolol possible cause for recurrent hypoglycemia Poor appetite due to odynophagia Insulin and C-peptide levels are both elevated Proinsulin, sulfonylurea panel pending MRI abdomen protocol with no evidence of pancreatic mass or other pathologic mass in the abdomen D/C IV dextrose for now and monitor Continue gabapentin D/C propanolol Monitor closely, CBGs every 4 hours  CAD Complex cardiac history, complained of chest pain radiating to left shoulder on 09/08/2019 Troponins negative, EKG with no acute ST changes Last cath in 04/2019 Cardiology consulted- appreciate recs Continue aspirin, Plavix, losartan, amlodipine, Imdur, statins, started low-dose Coreg, increased  Telemetry  Odynophagia/edentulous Several weeks of pain with swallowing liquids and solids EGD unremarkable Scheduled outpatient to see ENT again  Noted bradycardia Resolved Started on coreg by cardiology D/C propanolol (was prescribed by neurology for tremors) Monitor closely  CKD stage IIIa Creatinine at baseline Daily BMP  Hypertension Continue losartan, amlodipine, Imdur, coreg, d/c propanolol  Hypothyroidism Continue Synthroid  Early dementia Stable  Depression Continue bupropion, venlafaxine        Malnutrition Type:      Malnutrition Characteristics:      Nutrition Interventions:       Estimated body mass index is 27.02 kg/m as calculated from the following:   Height  as of this encounter: 5\' 2"  (1.575 m).   Weight as of this encounter: 67 kg.     Code Status: Full  Family Communication: None  at bedside  Disposition Plan: Status is: Inpatient  Remains inpatient appropriate because:Persistent severe electrolyte disturbances and Inpatient level of care appropriate due to severity of illness   Dispo: The patient is from: Home              Anticipated d/c is to: Home              Anticipated d/c date is: 2 days              Patient currently is not medically stable to d/c.    Consultants:  Cardiology  Procedures:  None  Antimicrobials:  None  DVT prophylaxis: Lovenox   Objective: Vitals:   09/10/19 0004 09/10/19 0453 09/10/19 0742 09/10/19 1152  BP: (!) 144/83 (!) 189/78 (!) 165/67 (!) 150/89  Pulse: 67 (!) 56 63 96  Resp: 16 16 17 17   Temp: 97.9 F (36.6 C) 98.5 F (36.9 C) 98.3 F (36.8 C) 98.3 F (36.8 C)  TempSrc: Oral Oral Oral   SpO2: 100% 100% 100% 99%  Weight:      Height:        Intake/Output Summary (Last 24 hours) at 09/10/2019 1556 Last data filed at 09/09/2019 2200 Gross per 24 hour  Intake --  Output 1 ml  Net -1 ml   Filed Weights   09/02/19 1855 09/03/19 1252  Weight: 65.8 kg 67 kg    Exam:  General: NAD   Cardiovascular: S1, S2 present  Respiratory: CTAB  Abdomen: Soft, nontender, nondistended, bowel sounds present  Musculoskeletal: No bilateral pedal edema noted  Skin: Normal  Psychiatry: Normal mood   Data Reviewed: CBC: Recent Labs  Lab 09/04/19 0459 09/05/19 0610 09/06/19 0640 09/07/19 0536 09/08/19 0713  WBC 12.6* 9.1 7.7 8.7 10.5  HGB 12.0 12.2 11.4* 12.4 11.7*  HCT 36.0 34.9* 32.7* 36.6 35.8*  MCV 83.1 81.2 80.5 82.4 84.8  PLT 215 163 171 177 654   Basic Metabolic Panel: Recent Labs  Lab 09/05/19 0610 09/06/19 0640 09/07/19 0536 09/08/19 0713 09/09/19 0913  NA 131* 132* 135 136 134*  K 3.9 3.6 4.3 4.3 4.2  CL 101 100 102 104 99  CO2 23 25 26 24 24   GLUCOSE 95 109* 80 90 181*  BUN 11 12 14 18 13   CREATININE 1.13* 1.13* 1.22* 1.40* 1.18*  CALCIUM 8.3* 8.0* 8.9 8.5* 8.6*    GFR: Estimated Creatinine Clearance: 51.2 mL/min (A) (by C-G formula based on SCr of 1.18 mg/dL (H)). Liver Function Tests: No results for input(s): AST, ALT, ALKPHOS, BILITOT, PROT, ALBUMIN in the last 168 hours. No results for input(s): LIPASE, AMYLASE in the last 168 hours. No results for input(s): AMMONIA in the last 168 hours. Coagulation Profile: No results for input(s): INR, PROTIME in the last 168 hours. Cardiac Enzymes: No results for input(s): CKTOTAL, CKMB, CKMBINDEX, TROPONINI in the last 168 hours. BNP (last 3 results) No results for input(s): PROBNP in the last 8760 hours. HbA1C: No results for input(s): HGBA1C in the last 72 hours. CBG: Recent Labs  Lab 09/10/19 0005 09/10/19 0048 09/10/19 0452 09/10/19 0744 09/10/19 1151  GLUCAP 64* 109* 80 75 208*   Lipid Profile: No results for input(s): CHOL, HDL, LDLCALC, TRIG, CHOLHDL, LDLDIRECT in the last 72 hours. Thyroid Function Tests: No results for input(s): TSH, T4TOTAL, FREET4, T3FREE, THYROIDAB in  the last 72 hours. Anemia Panel: No results for input(s): VITAMINB12, FOLATE, FERRITIN, TIBC, IRON, RETICCTPCT in the last 72 hours. Urine analysis:    Component Value Date/Time   COLORURINE YELLOW (A) 09/02/2019 1927   APPEARANCEUR HAZY (A) 09/02/2019 1927   APPEARANCEUR Cloudy (A) 07/17/2014 1455   LABSPEC 1.009 09/02/2019 1927   LABSPEC 1.012 12/29/2013 1210   PHURINE 5.0 09/02/2019 1927   GLUCOSEU NEGATIVE 09/02/2019 1927   GLUCOSEU >=500 12/29/2013 1210   HGBUR NEGATIVE 09/02/2019 1927   HGBUR negative 04/14/2010 1435   BILIRUBINUR NEGATIVE 09/02/2019 1927   BILIRUBINUR small 08/22/2019 1014   BILIRUBINUR Negative 07/17/2014 1455   BILIRUBINUR Negative 12/29/2013 1210   [place] 09/02/2019 1927   PROTEINUR NEGATIVE 09/02/2019 1927   UROBILINOGEN 0.2 08/22/2019 1014   UROBILINOGEN negative 04/14/2010 1435   NITRITE NEGATIVE 09/02/2019 1927   LEUKOCYTESUR LARGE (A) 09/02/2019 1927    LEUKOCYTESUR Negative 12/29/2013 1210   Sepsis Labs: @LABRCNTIP (procalcitonin:4,lacticidven:4)  ) Recent Results (from the past 240 hour(s))  SARS Coronavirus 2 by RT PCR (hospital order, performed in [facility] hospital lab) Nasopharyngeal Nasopharyngeal Swab     Status: None   Collection Time: 09/01/19  3:27 PM   Specimen: Nasopharyngeal Swab  Result Value Ref Range Status   SARS Coronavirus 2 NEGATIVE NEGATIVE Final    Comment: (NOTE) SARS-CoV-2 target nucleic acids are NOT DETECTED.  The SARS-CoV-2 RNA is generally detectable in upper and lower respiratory specimens during the acute phase of infection. The lowest concentration of SARS-CoV-2 viral copies this assay can detect is 250 copies / mL. A negative result does not preclude SARS-CoV-2 infection and should not be used as the sole basis for treatment or other patient management decisions.  A negative result may occur with improper specimen collection / handling, submission of specimen other than nasopharyngeal swab, presence of viral mutation(s) within the areas targeted by this assay, and inadequate number of viral copies (<250 copies / mL). A negative result must be combined with clinical observations, patient history, and epidemiological information.  Fact Sheet for Patients:   StrictlyIdeas.no  Fact Sheet for Healthcare Providers: BankingDealers.co.za  This test is not yet approved or  cleared by the [place] FDA and has been authorized for detection and/or diagnosis of SARS-CoV-2 by FDA under an Emergency Use Authorization (EUA).  This EUA will remain in effect (meaning this test can be used) for the duration of the COVID-19 declaration under Section 564(b)(1) of the Act, 21 U.S.C. section 360bbb-3(b)(1), unless the authorization is terminated or revoked sooner.  Performed at [facility], [address] [place], [place] [address]   Urine Culture      Status: Abnormal   Collection Time: 09/02/19  7:27 PM   Specimen: Urine, Random  Result Value Ref Range Status   Specimen Description   Final    URINE, RANDOM Performed at [facility], [address], [place], [place] [address]    Special Requests   Final    NONE Performed at [facility], [address] [place], [place] [address]    Culture MULTIPLE SPECIES PRESENT, SUGGEST RECOLLECTION (A)  Final   Report Status 09/04/2019 FINAL  Final  MRSA PCR Screening     Status: Abnormal   Collection Time: 09/03/19 12:51 PM   Specimen: Nasopharyngeal  Result Value Ref Range Status   MRSA by PCR POSITIVE (A) NEGATIVE Final    Comment:        The GeneXpert MRSA Assay (FDA approved  for NASAL specimens only), is one component of a comprehensive MRSA colonization surveillance program. It is not intended to diagnose MRSA infection nor to guide or monitor treatment for MRSA infections. RESULT CALLED TO, READ BACK BY AND VERIFIED WITH: Talbot Grumbling RN AT 4268 ON 09/03/19 [facility] Performed at [facility], [address], [place], [place] [address]       Studies: No results found.  Scheduled Meds: . amLODipine  5 mg Oral Daily  . aspirin EC  81 mg Oral Daily  . buPROPion  300 mg Oral Daily  . carvedilol  6.25 mg Oral BID WC  . Chlorhexidine Gluconate Cloth  6 each Topical Daily  . clopidogrel  75 mg Oral Daily  . enoxaparin (LOVENOX) injection  40 mg Subcutaneous Q24H  . gabapentin  300 mg Oral BID  . isosorbide mononitrate  90 mg Oral Daily  . levothyroxine  25 mcg Oral QAC breakfast  . losartan  25 mg Oral Daily  . pantoprazole  40 mg Oral BID  . rosuvastatin  5 mg Oral q1800  . scopolamine  1 patch Transdermal Q72H  . sodium chloride flush  3 mL Intravenous Once  . sodium chloride flush  3 mL Intravenous Q12H  . venlafaxine XR  150 mg Oral Q breakfast    Continuous Infusions:    LOS: 7 days     Alma Friendly, MD [facility]  If 7PM-7AM, please contact night-coverage [email or web address] 09/10/2019, 3:56 PM

## 2019-09-10 NOTE — Progress Notes (Signed)
Progress Note  Patient Name: [name] Date of Encounter: 09/10/2019  [facility] Cardiologist: Ida Rogue, MD   Subjective   Feeling better today.  She had a couple episodes of chest pain that occurred when lying in bed last night.  She has no chest pain when walking but has exertional dyspnea walking in the room.  Inpatient Medications    Scheduled Meds: . amLODipine  5 mg Oral Daily  . aspirin EC  81 mg Oral Daily  . buPROPion  300 mg Oral Daily  . carvedilol  3.125 mg Oral BID WC  . Chlorhexidine Gluconate Cloth  6 each Topical Daily  . clopidogrel  75 mg Oral Daily  . enoxaparin (LOVENOX) injection  40 mg Subcutaneous Q24H  . gabapentin  300 mg Oral BID  . isosorbide mononitrate  90 mg Oral Daily  . levothyroxine  25 mcg Oral QAC breakfast  . losartan  25 mg Oral Daily  . pantoprazole  40 mg Oral BID  . rosuvastatin  5 mg Oral q1800  . scopolamine  1 patch Transdermal Q72H  . sodium chloride flush  3 mL Intravenous Once  . sodium chloride flush  3 mL Intravenous Q12H  . venlafaxine XR  150 mg Oral Q breakfast   Continuous Infusions: . dextrose 75 mL/hr at 09/10/19 0918   PRN Meds: acetaminophen **OR** acetaminophen, ALPRAZolam, HYDROcodone-acetaminophen, ondansetron **OR** ondansetron (ZOFRAN) IV, promethazine, senna-docusate, traZODone   Vital Signs    Vitals:   09/09/19 2004 09/10/19 0004 09/10/19 0453 09/10/19 0742  BP: 131/80 (!) 144/83 (!) 189/78 (!) 165/67  Pulse: 98 67 (!) 56 63  Resp: 16 16 16 17   Temp: 97.8 F (36.6 C) 97.9 F (36.6 C) 98.5 F (36.9 C) 98.3 F (36.8 C)  TempSrc: Oral Oral Oral Oral  SpO2: 100% 100% 100% 100%  Weight:      Height:        Intake/Output Summary (Last 24 hours) at 09/10/2019 1059 Last data filed at 09/09/2019 2200 Gross per 24 hour  Intake --  Output 1 ml  Net -1 ml   Last 3 Weights 09/03/2019 09/02/2019 09/01/2019  Weight (lbs) 147 lb 11.3 oz 145 lb 145 lb  Weight (kg) 67 kg 65.772 kg 65.772 kg   Some encounter information is confidential and restricted. Go to Review Flowsheets activity to see all data.      Telemetry    Sinus rhythm, sinus tachycardia.- Personally Reviewed  ECG    09/09/2019: Sinus rhythm.  Rate 98 bpm.  Nonspecific T wave abnormalities. - Personally Reviewed  Physical Exam   VS:  BP (!) 165/67 (BP Location: Right Arm)   Pulse 63   Temp 98.3 F (36.8 C) (Oral)   Resp 17   Ht 5\' 2"  (1.575 m)   Wt 67 kg   SpO2 100%   BMI 27.02 kg/m  , BMI Body mass index is 27.02 kg/m. GENERAL:  Well appearing HEENT: Pupils equal round and reactive, fundi not visualized, oral mucosa unremarkable NECK:  No jugular venous distention, waveform within normal limits, carotid upstroke brisk and symmetric, no bruits, no thyromegaly LUNGS:  Clear to auscultation bilaterally HEART:  RRR.  PMI not displaced or sustained,S1 and S2 within normal limits, no S3, no S4, no clicks, no rubs, no murmurs ABD:  Flat, positive bowel sounds normal in frequency in pitch, no bruits, no rebound, no guarding, no midline pulsatile mass, no hepatomegaly, no splenomegaly EXT:  2 plus pulses throughout, no edema,  no cyanosis no clubbing SKIN:  No rashes no nodules NEURO:  Cranial nerves II through XII grossly intact, motor grossly intact throughout PSYCH:  Cognitively intact, oriented to person place and time   Labs    High Sensitivity Troponin:   Recent Labs  Lab 09/01/19 0817 09/01/19 1035 09/01/19 1657 09/08/19 0713 09/08/19 1114  TROPONINIHS 9 8 7 8 8       Chemistry Recent Labs  Lab 09/07/19 0536 09/08/19 0713 09/09/19 0913  NA 135 136 134*  K 4.3 4.3 4.2  CL 102 104 99  CO2 26 24 24   GLUCOSE 80 90 181*  BUN 14 18 13   CREATININE 1.22* 1.40* 1.18*  CALCIUM 8.9 8.5* 8.6*  GFRNONAA 52* 44* 54*  GFRAA 60* 51* >60  ANIONGAP 7 8 11      Hematology Recent Labs  Lab 09/06/19 0640 09/07/19 0536 09/08/19 0713  WBC 7.7 8.7 10.5  RBC 4.06 4.44 4.22  HGB 11.4* 12.4 11.7*   HCT 32.7* 36.6 35.8*  MCV 80.5 82.4 84.8  MCH 28.1 27.9 27.7  MCHC 34.9 33.9 32.7  RDW 13.8 13.9 14.0  PLT 171 177 170    BNPNo results for input(s): BNP, PROBNP in the last 168 hours.   DDimer No results for input(s): DDIMER in the last 168 hours.   Radiology    No results found.  Cardiac Studies   LHC 12/2017:  Non-stenotic 1st Diag lesion was previously treated.  Ost 1st Diag lesion is 40% stenosed.  Mid LAD-2 lesion is 5% stenosed.  Ost 2nd Diag to 2nd Diag lesion is 95% stenosed.  Mid LAD-1 lesion is 30% stenosed.  Dist LAD lesion is 80% stenosed.  Prox Cx lesion is 40% stenosed.  Mid RCA lesion is 30% stenosed.   Echo 01/2019: 1. Left ventricular ejection fraction, by visual estimation, is 60 to  65%. The left ventricle has normal function. There is no left ventricular  hypertrophy.  2. Left ventricular diastolic parameters are consistent with Grade I  diastolic dysfunction (impaired relaxation).  3. The left ventricle has no regional wall motion abnormalities.  4. Global right ventricle has normal systolic function.The right  ventricular size is normal. No increase in right ventricular wall  thickness.  5. Left atrial size was normal.  6. TR signal is inadequate for assessing pulmonary artery systolic  pressure.   Patient Profile     49 y.o. female with CAD s/p LAD/D1/D2 PCI, CKD III, chronic systolic and diastolic heart failure with normalization of LVEF, prior stroke, left carotid to subclavian artery bypass, aortic arch aneurysm, chronic anemia, obesity status post gastric bypass, bipolar disorder, diabetes, hypertension, hyperlipidemia admitted with chest pain and hypoglycemia.  Assessment & Plan    # CAD s/p PCI: # Obstructive CAD:  # Chronic angina:  Ms. Francella Solian. [name] has known 85% distal LAD lesions as well as 95% in ostial D2.  The LAD is too distal to stent and the ostial diagonal has been treated twice with balloon angioplasty with  recurrent restenosis.  Its not in a very good location for repeat PCI.  She had a stress test that was negative for ischemia 02/2019.  She is being medically managed.  She continues to report chest pain at rest.  She has exertional dyspnea but not exertional chest pain.  I think that the chest discomfort she is having is anxiety related.  It seems to improve be improving with Xanax and is nonexertional.  She does have known disease and her blood pressure  is high.  She was started on carvedilol at a low dose due to bradycardia.  Currently she is having sinus rhythm or sinus tachycardia.  We will increase the dose to 6.25 mg twice daily.  Continue aspirin, clopidogrel, amlodipine, and Imdur.  Imdur was increased this admission.  Could also consider Ranexa if this would be affordable or if she is unable to tolerate beta-blockers.  Cardiac enzymes have been negative and there are no acute ischemic changes on EKG.  No plan for repeat ischemia evaluation at this time.  # Hypoglycermia:  Work up per IM.      For questions or updates, please contact [place] Please consult [email or web address] for contact info under        Signed, Skeet Latch, MD  09/10/2019, 10:59 AM

## 2019-09-11 LAB — GLUCOSE, CAPILLARY
Glucose-Capillary: 173 mg/dL — ABNORMAL HIGH (ref 70–99)
Glucose-Capillary: 183 mg/dL — ABNORMAL HIGH (ref 70–99)
Glucose-Capillary: 218 mg/dL — ABNORMAL HIGH (ref 70–99)
Glucose-Capillary: 76 mg/dL (ref 70–99)
Glucose-Capillary: 80 mg/dL (ref 70–99)
Glucose-Capillary: 82 mg/dL (ref 70–99)
Glucose-Capillary: 99 mg/dL (ref 70–99)

## 2019-09-11 NOTE — Progress Notes (Signed)
PROGRESS NOTE  [name] EGB:[id number] DOB: Nov 30, 1970 DOA: 09/02/2019 PCP: Kendra Sons, MD  HPI/Recap of past 24 hours: HPI from Dr [name] is a 49 y.o. female with medical history significant for history of type 2 diabetes mellitus no longer on medications for this, history of gastric bypass, depression and anxiety, coronary artery disease, history of CVA, and hypothyroidism, now presenting to the emergency department with recurrent hypoglycemia.  Patient reports suffering from pain with swallowing for several weeks now, has discomfort swallowing both liquids and solids but denies any choking, cough, or shortness of breath.  She denies any fevers or chills.  Reports that the odynophagia had been evaluated by GI with upper endoscopy that she reports to have been unremarkable.  Patient denies using alcohol or any diabetes medications recently.  She was seen in the emergency department yesterday with chest discomfort, had normal at bedtime troponin x3, nonischemic EKG, and no acute findings on chest x-ray. In the ED, patient is found to be afebrile, saturating well on room air, slightly bradycardic, and with blood pressure 190/76.  EKG features a normal sinus rhythm.  Chemistry panel notable for glucose of 62 creatinine 1.39, similar to priors.  CBC with leukocytosis to 12,400.  Urine was sent for culture and she was given a dose of Rocephin for bacteriuria, and also treated with IV dextrose, but continues to have hypoglycemia despite this.    Today, patient continues to complain of intermittent chest pain at rest, with dyspnea on exertion.  CBGs stable off of IV dextrose.  Patient has her dentures and has been able to tolerate more food.     Assessment/Plan: Principal Problem:   Hypoglycemia Active Problems:   Depression with anxiety   Essential hypertension   Stroke (HCC)   Hypothyroidism   CAD (coronary artery disease)   Chronic kidney disease, stage  3a   Recurrent hypoglycemia Improving off of IV dextrose Patient is edentulous and unable to eat most foods, which may be contributing to her poor appetite (painful gums) and hypoglycemia Hx of DM2 with A1c of 4.8, history of peripheral neuropathy Not on any for DM for the past 2 months or more ??Propanolol possible cause for recurrent hypoglycemia Poor appetite due to odynophagia Insulin and C-peptide levels are both elevated Proinsulin elevated, sulfonylurea panel WNL MRI abdomen protocol with no evidence of pancreatic mass or other pathologic mass in the abdomen S/P IV dextrose Continue gabapentin D/C propanolol Monitor closely, CBGs every 4 hours  CAD with ?angina Complex cardiac history, complained of chest pain radiating to left shoulder on 09/08/2019 Troponins negative, EKG with no acute ST changes Last cath in 04/2019 Cardiology consulted- appreciate recs Continue aspirin, Plavix, losartan, amlodipine, Imdur, statins, started Coreg Telemetry  Odynophagia/edentulous Several weeks of pain with swallowing liquids and solids EGD unremarkable Scheduled outpatient to see ENT again  Noted bradycardia Resolved Started on coreg by cardiology D/C propanolol (was prescribed by neurology for tremors) Monitor closely  CKD stage IIIa Creatinine at baseline Daily BMP  Hypertension Continue losartan, amlodipine, Imdur, coreg, d/c propanolol  Hypothyroidism Continue Synthroid  Early dementia Stable  Depression Continue bupropion, venlafaxine        Malnutrition Type:      Malnutrition Characteristics:      Nutrition Interventions:       Estimated body mass index is 27.02 kg/m as calculated from the following:   Height as of this encounter: 5\' 2"  (1.575 m).   Weight as of  this encounter: 67 kg.     Code Status: Full  Family Communication: None at bedside  Disposition Plan: Status is: Inpatient  Remains inpatient appropriate because:Persistent  severe electrolyte disturbances and Inpatient level of care appropriate due to severity of illness   Dispo: The patient is from: Home              Anticipated d/c is to: Home              Anticipated d/c date is: 1 day              Patient currently is not medically stable to d/c.    Consultants:  Cardiology  Procedures:  None  Antimicrobials:  None  DVT prophylaxis: Lovenox   Objective: Vitals:   09/11/19 0034 09/11/19 0351 09/11/19 0729 09/11/19 1229  BP: 127/77 124/80 (!) 160/88 (!) 146/85  Pulse: 67 62 74 74  Resp: 16 18 16 17   Temp: 97.9 F (36.6 C) 97.7 F (36.5 C) 98 F (36.7 C) 98.1 F (36.7 C)  TempSrc: Oral Oral    SpO2: 98% 99% 98% 100%  Weight:      Height:       No intake or output data in the 24 hours ending 09/11/19 1459 Filed Weights   09/02/19 1855 09/03/19 1252  Weight: 65.8 kg 67 kg    Exam:  General: NAD   Cardiovascular: S1, S2 present  Respiratory: CTAB  Abdomen: Soft, nontender, nondistended, bowel sounds present  Musculoskeletal: No bilateral pedal edema noted  Skin: Normal  Psychiatry: Normal mood   Data Reviewed: CBC: Recent Labs  Lab 09/05/19 0610 09/06/19 0640 09/07/19 0536 09/08/19 0713  WBC 9.1 7.7 8.7 10.5  HGB 12.2 11.4* 12.4 11.7*  HCT 34.9* 32.7* 36.6 35.8*  MCV 81.2 80.5 82.4 84.8  PLT 163 171 177 161   Basic Metabolic Panel: Recent Labs  Lab 09/05/19 0610 09/06/19 0640 09/07/19 0536 09/08/19 0713 09/09/19 0913  NA 131* 132* 135 136 134*  K 3.9 3.6 4.3 4.3 4.2  CL 101 100 102 104 99  CO2 23 25 26 24 24   GLUCOSE 95 109* 80 90 181*  BUN 11 12 14 18 13   CREATININE 1.13* 1.13* 1.22* 1.40* 1.18*  CALCIUM 8.3* 8.0* 8.9 8.5* 8.6*   GFR: Estimated Creatinine Clearance: 51.2 mL/min (A) (by C-G formula based on SCr of 1.18 mg/dL (H)). Liver Function Tests: No results for input(s): AST, ALT, ALKPHOS, BILITOT, PROT, ALBUMIN in the last 168 hours. No results for input(s): LIPASE, AMYLASE in the  last 168 hours. No results for input(s): AMMONIA in the last 168 hours. Coagulation Profile: No results for input(s): INR, PROTIME in the last 168 hours. Cardiac Enzymes: No results for input(s): CKTOTAL, CKMB, CKMBINDEX, TROPONINI in the last 168 hours. BNP (last 3 results) No results for input(s): PROBNP in the last 8760 hours. HbA1C: No results for input(s): HGBA1C in the last 72 hours. CBG: Recent Labs  Lab 09/10/19 2013 09/11/19 0033 09/11/19 0347 09/11/19 0733 09/11/19 1237  GLUCAP 130* 82 80 99 218*   Lipid Profile: No results for input(s): CHOL, HDL, LDLCALC, TRIG, CHOLHDL, LDLDIRECT in the last 72 hours. Thyroid Function Tests: No results for input(s): TSH, T4TOTAL, FREET4, T3FREE, THYROIDAB in the last 72 hours. Anemia Panel: No results for input(s): VITAMINB12, FOLATE, FERRITIN, TIBC, IRON, RETICCTPCT in the last 72 hours. Urine analysis:    Component Value Date/Time   COLORURINE YELLOW (A) 09/02/2019 1927   APPEARANCEUR HAZY (A) 09/02/2019 1927  APPEARANCEUR Cloudy (A) 07/17/2014 1455   LABSPEC 1.009 09/02/2019 1927   LABSPEC 1.012 12/29/2013 1210   PHURINE 5.0 09/02/2019 1927   GLUCOSEU NEGATIVE 09/02/2019 1927   GLUCOSEU >=500 12/29/2013 1210   HGBUR NEGATIVE 09/02/2019 1927   HGBUR negative 04/14/2010 [place] 09/02/2019 1927   BILIRUBINUR small 08/22/2019 1014   BILIRUBINUR Negative 07/17/2014 1455   BILIRUBINUR Negative 12/29/2013 [place] 09/02/2019 1927   PROTEINUR NEGATIVE 09/02/2019 1927   UROBILINOGEN 0.2 08/22/2019 1014   UROBILINOGEN negative 04/14/2010 1435   NITRITE NEGATIVE 09/02/2019 1927   LEUKOCYTESUR LARGE (A) 09/02/2019 1927   LEUKOCYTESUR Negative 12/29/2013 1210   Sepsis Labs: @LABRCNTIP (procalcitonin:4,lacticidven:4)  ) Recent Results (from the past 240 hour(s))  SARS Coronavirus 2 by RT PCR (hospital order, performed in [place] hospital lab) Nasopharyngeal Nasopharyngeal Swab      Status: None   Collection Time: 09/01/19  3:27 PM   Specimen: Nasopharyngeal Swab  Result Value Ref Range Status   SARS Coronavirus 2 NEGATIVE NEGATIVE Final    Comment: (NOTE) SARS-CoV-2 target nucleic acids are NOT DETECTED.  The SARS-CoV-2 RNA is generally detectable in upper and lower respiratory specimens during the acute phase of infection. The lowest concentration of SARS-CoV-2 viral copies this assay can detect is 250 copies / mL. A negative result does not preclude SARS-CoV-2 infection and should not be used as the sole basis for treatment or other patient management decisions.  A negative result may occur with improper specimen collection / handling, submission of specimen other than nasopharyngeal swab, presence of viral mutation(s) within the areas targeted by this assay, and inadequate number of viral copies (<250 copies / mL). A negative result must be combined with clinical observations, patient history, and epidemiological information.  Fact Sheet for Patients:   StrictlyIdeas.no  Fact Sheet for Healthcare Providers: BankingDealers.co.za  This test is not yet approved or  cleared by the [place] FDA and has been authorized for detection and/or diagnosis of SARS-CoV-2 by FDA under an Emergency Use Authorization (EUA).  This EUA will remain in effect (meaning this test can be used) for the duration of the COVID-19 declaration under Section 564(b)(1) of the Act, 21 U.S.C. section 360bbb-3(b)(1), unless the authorization is terminated or revoked sooner.  Performed at [facility], [address] [place], [place] [address]   Urine Culture     Status: Abnormal   Collection Time: 09/02/19  7:27 PM   Specimen: Urine, Random  Result Value Ref Range Status   Specimen Description   Final    URINE, RANDOM Performed at [facility], [address] [place], [place] [address]    Special Requests    Final    NONE Performed at [facility], [address] [place], [place] [address]    Culture MULTIPLE SPECIES PRESENT, SUGGEST RECOLLECTION (A)  Final   Report Status 09/04/2019 FINAL  Final  MRSA PCR Screening     Status: Abnormal   Collection Time: 09/03/19 12:51 PM   Specimen: Nasopharyngeal  Result Value Ref Range Status   MRSA by PCR POSITIVE (A) NEGATIVE Final    Comment:        The GeneXpert MRSA Assay (FDA approved for NASAL specimens only), is one component of a comprehensive MRSA colonization surveillance program. It is not intended to diagnose MRSA infection nor to guide or monitor treatment for MRSA infections. RESULT CALLED TO, READ BACK BY AND VERIFIED WITH: CHRISTINA BAYNES RN AT [phone]  ON 09/03/19 SNG Performed at [facility], [address] [place], [place] [address]       Studies: No results found.  Scheduled Meds: . amLODipine  5 mg Oral Daily  . aspirin EC  81 mg Oral Daily  . buPROPion  300 mg Oral Daily  . carvedilol  6.25 mg Oral BID WC  . Chlorhexidine Gluconate Cloth  6 each Topical Daily  . clopidogrel  75 mg Oral Daily  . enoxaparin (LOVENOX) injection  40 mg Subcutaneous Q24H  . gabapentin  300 mg Oral BID  . isosorbide mononitrate  90 mg Oral Daily  . levothyroxine  25 mcg Oral QAC breakfast  . losartan  25 mg Oral Daily  . pantoprazole  40 mg Oral BID  . rosuvastatin  5 mg Oral q1800  . scopolamine  1 patch Transdermal Q72H  . sodium chloride flush  3 mL Intravenous Once  . sodium chloride flush  3 mL Intravenous Q12H  . venlafaxine XR  150 mg Oral Q breakfast    Continuous Infusions:    LOS: 8 days     Alma Friendly, MD [facility]  If 7PM-7AM, please contact night-coverage [email or web address] 09/11/2019, 2:59 PM

## 2019-09-11 NOTE — Care Management Important Message (Signed)
Important Message  Patient Details  Name: [name] MRN: [id number] Date of Birth: 1970/08/10   Medicare Important Message Given:  Yes     Shelbie Hutching, RN 09/11/2019, 2:09 PM

## 2019-09-11 NOTE — TOC Initial Note (Signed)
Transition of Care [facility]) - Initial/Assessment Note    Patient Details  Name: [name] MRN: [id number] Date of Birth: 1971-01-20  Transition of Care [facility]) CM/SW Contact:    Shelbie Hutching, RN Phone Number: 09/11/2019, 1:56 PM  Clinical Narrative:                 Patient admitted to the hospital for hypoglycemia.  Patient's hypoglycemia has resolved but she is having chest pain and cardiology is consulted.  Patient is from home with her son, [name], who is a Airline pilot.  Patient is independent at home and drives, she walks with a cane and also has a walker if needed.  Patient is current with PCP, Dr. Caryn Section.  Patient would like to have home health services at discharge through [facility], she has used them in the past.  Floydene Flock with [facility] accepted referral for RN, PT, and OT.  RNCM will cont to follow through discharge.   Expected Discharge Plan: [place] Barriers to Discharge: Continued Medical Work up   Patient Goals and CMS Choice   CMS Medicare.gov Compare Post Acute Care list provided to:: Patient Choice offered to / list presented to : Patient  Expected Discharge Plan and Services Expected Discharge Plan: [place]   Discharge Planning Services: CM Consult Post Acute Care Choice: [place] arrangements for the past 2 months: [place] Arranged: RN, PT, OT [facility] Agency: [place] ([place]) Date HH Agency Contacted: 09/11/19 Time [place]: 30 Representative spoke with at [place]: Floydene Flock  Prior Living Arrangements/Services Living arrangements for the past 2 months: [place] Lives with:: Adult Children (son) Patient language and need for interpreter reviewed:: Yes Do you feel safe going back to the place where you live?: Yes      Need for Family Participation in Patient Care: Yes (Comment) (hypoglycemia, chest pain) Care giver  support system in place?: Yes (comment) (son) Current home services: DME (walkr and cane) Criminal Activity/Legal Involvement Pertinent to Current Situation/Hospitalization: No - Comment as needed  Activities of Daily Living Home Assistive Devices/Equipment: None ADL Screening (condition at time of admission) Patient's cognitive ability adequate to safely complete daily activities?: Yes Is the patient deaf or have difficulty hearing?: Yes Does the patient have difficulty seeing, even when wearing glasses/contacts?: No Does the patient have difficulty concentrating, remembering, or making decisions?: No Patient able to express need for assistance with ADLs?: Yes Does the patient have difficulty dressing or bathing?: No Independently performs ADLs?: Yes (appropriate for developmental age) Does the patient have difficulty walking or climbing stairs?: Yes Weakness of Legs: Both Weakness of Arms/Hands: Both  Permission Sought/Granted Permission sought to share information with : Case Manager, Family Supports, Other (comment) Permission granted to share information with : Yes, Verbal Permission Granted  Share Information with NAME: [name]  Permission granted to share info w AGENCY: [place] granted to share info w Relationship: son     Emotional Assessment Appearance:: Appears older than stated age Attitude/Demeanor/Rapport: Engaged Affect (typically observed): Accepting Orientation: : Oriented to Self, Oriented to Place, Oriented to  Time, Oriented to Situation Alcohol / Substance Use: Not Applicable Psych Involvement: No (comment)  Admission diagnosis:  Hypoglycemia [E16.2] Urinary tract infection without hematuria, site unspecified [N39.0]  Patient Active Problem List   Diagnosis Date Noted  . Chronic kidney disease, stage 3a 09/02/2019  . Hypoglycemia   . Family history of breast cancer 05/23/2019  . Family history of colon cancer 05/23/2019  . Abnormal  LFTs 04/18/2019  . Esophageal dysphagia   . Gastrointestinal tract imaging abnormality   . Trochanteric bursitis, left hip 03/17/2019  . Malignant neoplasm of vulva, unspecified ([place]) 03/10/2019  . Peripheral vascular disease, unspecified ([place]) 03/10/2019  . Paroxysmal supraventricular tachycardia ([place]) 03/10/2019  . Contusion of left hip 03/06/2019  . Chest pain, moderate coronary artery risk 01/27/2019  . Melena   . Hypertensive urgency   . Recurrent chest pain 01/19/2019  . Depression, major, single episode, moderate ([place]) 12/09/2018  . Closed nondisplaced intertrochanteric fracture of left femur ([place]) 10/12/2018  . Hypotension 06/26/2018  . Autonomic neuropathy 03/24/2018  . Acute delirium 03/03/2018  . H/O gastric bypass 03/02/2018  . Hypertension associated with stage 3 chronic kidney disease due to type 2 diabetes mellitus ([place]) 02/20/2018  . SI (sacroiliac) joint dysfunction 12/02/2017  . Effort angina ([place]) 06/29/2017  . Unstable angina ([place]) 06/24/2017  . Syncope 04/09/2017  . Insomnia 03/18/2017  . Ischemic cardiomyopathy   . Arthritis   . Anxiety   . Tendinitis of upper biceps tendon of right shoulder 03/16/2017  . Hx of colonic polyps   . H/O medication noncompliance 12/14/2015  . CAD (coronary artery disease)   . Hypertensive heart disease   . Type 2 DM with CKD stage 4 and hypertension ([place]) 06/05/2015  . Status post bariatric surgery 06/04/2015  . Carotid stenosis 04/30/2015  . Stable angina pectoris ([place]) 04/17/2015  . Iron deficiency anemia 03/22/2015  . Vitamin B12 deficiency 02/18/2015  . Misuse of medications for pain 02/18/2015  . Major depressive disorder, recurrent, severe with psychotic features ([place]) 02/15/2015  . Helicobacter pylori infection 11/23/2014  . Hemiparesis, left ([place]) 11/23/2014  . Malignant melanoma ([place]) 08/25/2014  . Chronic systolic CHF (congestive heart failure) ([place])   . Incomplete bladder emptying 07/12/2014  . Hypothyroidism  12/30/2013  . Aberrant subclavian artery 11/17/2013  . Multiple sclerosis ([place]) 11/02/2013  . History of CVA with residual deficit 06/20/2013  . Headache, migraine 05/29/2013  . Hyperlipidemia   . GERD (gastroesophageal reflux disease)   . Neuropathy ([place]) 01/02/2011  . Stroke ([place]) 06/21/2008  . Depression with anxiety 05/01/2008  . Essential hypertension 05/01/2008   PCP:  [name], MD Pharmacy:   [name] [address] [place] [address] [name] [address] Phone: [phone] Fax: [place]), [place] [address] [place] [address] Phone: [phone] Fax: [phone]     Social Determinants of Health (SDOH) Interventions    Readmission Risk Interventions Readmission Risk Prevention Plan 09/05/2019 06/26/2018 06/16/2018  Transportation Screening Complete Complete Complete  Medication Review Press photographer) Complete Complete Complete  PCP or Specialist appointment within 3-5 days of discharge Complete - Complete  HRI or Home Care Consult - Complete Complete  SW Recovery Care/Counseling Consult Complete - Not Complete  SW Consult Not Complete Comments - - NA  Palliative Care Screening Not Applicable - Not [place] Not Applicable - Not Applicable  Some recent data might be hidden

## 2019-09-11 NOTE — Care Management Important Message (Signed)
Important Message  Patient Details  Name: [name] MRN: [id number] Date of Birth: 09-02-1970   Medicare Important Message Given:  Yes     Loann Quill 09/11/2019, 1:03 PM

## 2019-09-11 NOTE — Progress Notes (Signed)
Progress Note  Patient Name: [name] Date of Encounter: 09/11/2019  Primary Cardiologist: [name]  Subjective   She continues to note intermittent episodes of chest pain and dyspnea along with positional dizziness. BP has ranged from the 505L to 976B systolic this morning. No change in decreased appetite.   Inpatient Medications    Scheduled Meds: . amLODipine  5 mg Oral Daily  . aspirin EC  81 mg Oral Daily  . buPROPion  300 mg Oral Daily  . carvedilol  6.25 mg Oral BID WC  . Chlorhexidine Gluconate Cloth  6 each Topical Daily  . clopidogrel  75 mg Oral Daily  . enoxaparin (LOVENOX) injection  40 mg Subcutaneous Q24H  . gabapentin  300 mg Oral BID  . isosorbide mononitrate  90 mg Oral Daily  . levothyroxine  25 mcg Oral QAC breakfast  . losartan  25 mg Oral Daily  . pantoprazole  40 mg Oral BID  . rosuvastatin  5 mg Oral q1800  . scopolamine  1 patch Transdermal Q72H  . sodium chloride flush  3 mL Intravenous Once  . sodium chloride flush  3 mL Intravenous Q12H  . venlafaxine XR  150 mg Oral Q breakfast   Continuous Infusions:  PRN Meds: acetaminophen **OR** acetaminophen, ALPRAZolam, HYDROcodone-acetaminophen, ondansetron **OR** ondansetron (ZOFRAN) IV, promethazine, senna-docusate, traZODone   Vital Signs    Vitals:   09/10/19 1946 09/11/19 0034 09/11/19 0351 09/11/19 0729  BP: 134/77 127/77 124/80 (!) 160/88  Pulse: 76 67 62 74  Resp: 16 16 18 16   Temp: 98.7 F (37.1 C) 97.9 F (36.6 C) 97.7 F (36.5 C) 98 F (36.7 C)  TempSrc: Oral Oral Oral   SpO2: 97% 98% 99% 98%  Weight:      Height:       No intake or output data in the 24 hours ending 09/11/19 1016 Filed Weights   09/02/19 1855 09/03/19 1252  Weight: 65.8 kg 67 kg    Telemetry    SR with sinus tachycardia - Personally Reviewed  ECG    No new tracings - Personally Reviewed  Physical Exam   GEN: No acute distress.   Neck: No JVD. Cardiac: RRR, no murmurs, rubs, or  gallops.  Respiratory: Clear to auscultation bilaterally.  GI: Soft, nontender, non-distended.   MS: No edema; No deformity. Neuro:  Alert and oriented x 3; Nonfocal.  Psych: Normal affect.  Labs    Chemistry Recent Labs  Lab 09/07/19 0536 09/08/19 0713 09/09/19 0913  NA 135 136 134*  K 4.3 4.3 4.2  CL 102 104 99  CO2 26 24 24   GLUCOSE 80 90 181*  BUN 14 18 13   CREATININE 1.22* 1.40* 1.18*  CALCIUM 8.9 8.5* 8.6*  GFRNONAA 52* 44* 54*  GFRAA 60* 51* >60  ANIONGAP 7 8 11      Hematology Recent Labs  Lab 09/06/19 0640 09/07/19 0536 09/08/19 0713  WBC 7.7 8.7 10.5  RBC 4.06 4.44 4.22  HGB 11.4* 12.4 11.7*  HCT 32.7* 36.6 35.8*  MCV 80.5 82.4 84.8  MCH 28.1 27.9 27.7  MCHC 34.9 33.9 32.7  RDW 13.8 13.9 14.0  PLT 171 177 170    Cardiac EnzymesNo results for input(s): TROPONINI in the last 168 hours. No results for input(s): TROPIPOC in the last 168 hours.   BNPNo results for input(s): BNP, PROBNP in the last 168 hours.   DDimer No results for input(s): DDIMER in the last 168 hours.   Radiology  No results found.  Cardiac Studies   LHC 04/20/2019: Coronary dominance: Right  Left mainstem: Large vessel that bifurcates into the LAD and left circumflex, no significant disease noted  Left anterior descending (LAD): Large vessel that extends to the apical region, diagonal branch #1 of moderate size with patent stent , diagonal #2 small to moderate in size with severe ostial/proximal disease, severe distal disease estimated 80%  Left circumflex (LCx): Large vessel with OM branch 2, mild to moderate mid vessel disease  Right coronary artery (RCA): Right dominant vessel with PL and PDA, no significant disease noted , very mild mid vessel disease  Left ventriculography: Left ventricular systolic function is normal, LVEF is estimated at 55-65%, there is no significant mitral regurgitation , no significant aortic valve stenosis  Final Conclusions:  In  comparison to images from catheterization 2019 no significant change Severe native coronary disease, Patent stents LAD and diagonal vessel Stable severe ostial/proximal diagonal #2 disease, no change from previous catheterization 2019, not amenable to stenting Distal LAD is small and tapers, severe disease, not amenable to stenting  Recommendations:  Continue aggressive medical management, LDL less than 70, aggressive diabetes control, antianginals Of note left ventricular end-diastolic pressure was low consistent with dehydration, would continue IV fluids __________  2D echo 01/2019: 1. Left ventricular ejection fraction, by visual estimation, is 60 to  65%. The left ventricle has normal function. There is no left ventricular  hypertrophy.  2. Left ventricular diastolic parameters are consistent with Grade I  diastolic dysfunction (impaired relaxation).  3. The left ventricle has no regional wall motion abnormalities.  4. Global right ventricle has normal systolic function.The right  ventricular size is normal. No increase in right ventricular wall  thickness.  5. Left atrial size was normal.  6. TR signal is inadequate for assessing pulmonary artery systolic  pressure.   Patient Profile     49 y.o. female with history of CAD s/p prior PCI to the LAD and D1 with stable coronary anatomy by most recent cath in 04/2019,HFrEF secondary to ICM with prior EF of 45-50% in 2016 subsequent normalization of LVSF by echo in 10/2015,CKD stage III, prior CVAs, left carotid to subclavian artery bypass with subclavian artery ligation, aortic arch aneurysm, anemia, obesity status post gastric bypass,bipolar disorder, DMwith polyneuropathy and hypoglycemia,migraine disorder,HTN, HLD, and frequent falls/syncope/near syncope who is being seen today for the evaluation of chronic chest pain.  Assessment & Plan    1. CAD involving the native coronary arteries with chronic angina: -She most  recently underwent LHC in 04/2019 which showed patent stents in the LAD and diagonal with stable severe ostial to proximal D2 disease that was unchanged from cath in 2019 and not amenable to stenting.  Her distal LAD was small and tapered to severe disease which also was not amenable to stenting.  Medical management was advised -Patient has continued to have intermittent chronic angina with nonacute EKG this admission and high-sensitivity troponins negative x5 dating back to her ED visit from 7/30 -No indication for heparin drip -Continued increased dose of Imdur 90 mg daily with possible titration to 120 mg daily pending orthostatic vitals -She indicates Ranexa does not help and declines retrial of this during this admission  -Continue amlodipine for antianginal effect along with aspirin, clopidogrel, and losartan -If she continues to have angina despite maximally tolerated medical therapy, she may need repeat LHC with consideration for complex PCI of the LAD/D1 bifurcation. This is less than ideal given the actual stenosis  involves the small diagonal branch with increased risk for needing to stent the LAD following intervention on the D1 as the LAD demonstrates no significant stenosis at this location. This will be discussed with rounding MD today  2. HFrEF secondary to ICM: -Subsequent normalization of LV systolic function as outlined above -Beta-blocker has been held at this time secondary to bradycardia with most recent heart rates improved, resume when able -Continue losartan -With most recent EF normalized no indication for further escalation of GDMT at this time  3.  CKD stage III: -Stable  4.  HTN/dizziness: -Blood pressure has ranged from the 022H to 798V systolic this morning -Check orthostatic vitals prior to titration of medications -Continue current medications  5.  HLD: -LDL 76 in 04/2019 -PTA Crestor  6. Hypoglycemia/poor PO intake: -Possibly contributing to her  dizziness -Management per IM  For questions or updates, please contact [place] Please consult [email or web address] for contact info under Cardiology/STEMI.    Signed, Christell Faith, PA-C [place] Pager: [phone] 09/11/2019, 10:16 AM

## 2019-09-12 ENCOUNTER — Ambulatory Visit: Payer: Medicare Other

## 2019-09-12 LAB — GLUCOSE, CAPILLARY
Glucose-Capillary: 87 mg/dL (ref 70–99)
Glucose-Capillary: 86 mg/dL (ref 70–99)
Glucose-Capillary: 115 mg/dL — ABNORMAL HIGH (ref 70–99)

## 2019-09-12 MED ORDER — DIPHENOXYLATE-ATROPINE 2.5-0.025 MG PO TABS: 1.0000 | ORAL_TABLET | Freq: Four times a day (QID) | ORAL | Status: DC | PRN

## 2019-09-12 MED ORDER — ISOSORBIDE MONONITRATE ER 30 MG PO TB24: 90.0000 mg | ORAL_TABLET | Freq: Every day | ORAL | 0 refills | Status: DC

## 2019-09-12 MED ORDER — CLOPIDOGREL BISULFATE 75 MG PO TABS: 75.0000 mg | ORAL_TABLET | Freq: Every day | ORAL | 0 refills | Status: DC

## 2019-09-12 MED ORDER — CARVEDILOL 6.25 MG PO TABS: 6.2500 mg | ORAL_TABLET | Freq: Two times a day (BID) | ORAL | 0 refills | Status: DC

## 2019-09-12 MED ORDER — ROSUVASTATIN CALCIUM 5 MG PO TABS: 5.0000 mg | ORAL_TABLET | Freq: Every day | ORAL | 0 refills | Status: DC

## 2019-09-12 NOTE — Discharge Summary (Signed)
Discharge Summary  [place] UMP:[id number] DOB: 12/02/1970  PCP: [name], MD  Admit date: 09/02/2019 Discharge date: 09/12/2019  Time spent: 45 mins  Recommendations for Outpatient Follow-up:  1. Follow-up with PCP in 1 week 2. Follow-up with cardiology as scheduled 3. Follow-up with NP who manages her diabetes for possible endocrinologist outpatient consult    Discharge Diagnoses:  Active Hospital Problems   Diagnosis Date Noted  . Hypoglycemia   . Chronic kidney disease, stage 3a 09/02/2019  . CAD (coronary artery disease)   . Hypothyroidism 12/30/2013  . Stroke ([place]) 06/21/2008  . Depression with anxiety 05/01/2008  . Essential hypertension 05/01/2008    Resolved Hospital Problems  No resolved problems to display.    Discharge Condition: Stable  Diet recommendation: Heart healthy  Vitals:   09/12/19 0328 09/12/19 0828  BP: (!) 151/79 (!) 144/85  Pulse: (!) 58 66  Resp: 16 14  Temp: 97.9 F (36.6 C) 98.7 F (37.1 C)  SpO2: 100% 100%    History of present illness:  [employer] a 49 y.o.femalewith medical history significant forhistory of type 2 diabetes mellitus no longer on medications for this, history of gastric bypass, depression and anxiety, coronary artery disease, history of CVA, and hypothyroidism, now presenting to the emergency department with recurrent hypoglycemia. Patient reports suffering from pain with swallowing for several weeks now, has discomfort swallowing both liquids and solids but denies any choking, cough, or shortness of breath. She denies any fevers or chills. Reports that the odynophagia had been evaluated by GI with upper endoscopy that she reports to have been unremarkable. Patient denies using alcohol or any diabetes medications recently. She was seen in the emergency department yesterday with chest discomfort, had normal at bedtime troponin x3, nonischemic EKG, and no acute findings on chest  x-ray. In the ED, patient is found to be afebrile, saturating well on room air, slightly bradycardic, and with blood pressure 190/76. EKG features a normal sinus rhythm. Chemistry panel notable for glucose of 62 creatinine 1.39, similar to priors. CBC with leukocytosis to 12,400. Urine was sent for culture and she was given a dose of Rocephin for bacteriuria, and also treated with IV dextrose, but continues to have hypoglycemia despite this.    Today, patient denies any new complaints, still reports intermittent chest pain at rest, denies it worsening. Patient denies any worsening shortness of breath, abdominal pain, nausea/vomiting, fever/chills. CBGs remained stable off IV dextrose as patient has continue to eat better with her dentures. Patient reports she needs to dentures adjusted, and has a follow-up appointment for that. Discussed extensively with patient, to follow-up with PCP, nurse practitioner who manages her diabetes for possible outpatient endocrinologist management, cardiology as scheduled.    Hospital Course:  Principal Problem:   Hypoglycemia Active Problems:   Depression with anxiety   Essential hypertension   Stroke [facility])   Hypothyroidism   CAD (coronary artery disease)   Chronic kidney disease, stage 3a  Recurrent hypoglycemia Improved off of IV dextrose as pt able to tolerate more with her dentures in place Patient is edentulous and unable to eat most foods, which may be contributing to her poor appetite (painful gums) and hypoglycemia Hx of DM2 with A1c of 4.8, history of peripheral neuropathy Not on any for DM for the past 2 months or more Insulin and C-peptide levels are both elevated Proinsulin elevated, sulfonylurea panel WNL MRI abdomen protocol with no evidence of pancreatic mass or other pathologic mass in  the abdomen Continue gabapentin Patient aware of signs of hypoglycemia, encouraged to eat frequently, check her sugars at least 4 times a day, could use  glucose tabs as needed  CAD with ?angina Complex cardiac history, complained of chest pain radiating to left shoulder on 09/08/2019 Troponins negative, EKG with no acute ST changes Last cath in 04/2019 Cardiology consulted- appreciate recs, follow-up as an outpatient Continue aspirin, Plavix, losartan, amlodipine, Imdur, statins, Coreg Outpatient follow-up with cardiology  Odynophagia/edentulous Several weeks of pain with swallowing liquids and solids EGD unremarkable Scheduled outpatient to see ENT again  Noted bradycardia Resolved Started on coreg by cardiology D/C propanolol (was prescribed by neurology for tremors) Follow-up with cardiology  CKD stage IIIa Creatinine at baseline  Hypertension Continue losartan, amlodipine, Imdur, coreg, d/c propanolol  Hypothyroidism Continue Synthroid  Early dementia Stable  Depression Continue bupropion, venlafaxine       Malnutrition Type:      Malnutrition Characteristics:      Nutrition Interventions:      Estimated body mass index is 27.02 kg/m as calculated from the following:   Height as of this encounter: 5\' 2"  (1.575 m).   Weight as of this encounter: 67 kg.    Procedures:  None  Consultations:  Cardiology  Discharge Exam: BP (!) 144/85 (BP Location: Right Arm)   Pulse 66   Temp 98.7 F (37.1 C)   Resp 14   Ht 5\' 2"  (1.575 m)   Wt 67 kg   SpO2 100%   BMI 27.02 kg/m   General: NAD Cardiovascular: S1, S2 present Respiratory: CTA B    Discharge Instructions You were cared for by a hospitalist during your hospital stay. If you have any questions about your discharge medications or the care you received while you were in the hospital after you are discharged, you can call the unit and asked to speak with the hospitalist on call if the hospitalist that took care of you is not available. Once you are discharged, your primary care physician will handle any further medical issues. Please  note that NO REFILLS for any discharge medications will be authorized once you are discharged, as it is imperative that you return to your primary care physician (or establish a relationship with a primary care physician if you do not have one) for your aftercare needs so that they can reassess your need for medications and monitor your lab values.  Discharge Instructions    Diet - low sodium heart healthy   Complete by: As directed    Increase activity slowly   Complete by: As directed      Allergies as of 09/12/2019      Reactions   Lipitor [atorvastatin] Other (See Comments)   Leg pains   Tramadol Other (See Comments)   Mouth feels like it's on fire      Medication List    STOP taking these medications   celecoxib 200 MG capsule Commonly known as: CELEBREX   nitrofurantoin (macrocrystal-monohydrate) 100 MG capsule Commonly known as: MACROBID   propranolol ER 60 MG 24 hr capsule Commonly known as: INDERAL LA     TAKE these medications   ALPRAZolam 1 MG tablet Commonly known as: XANAX Take 1 tablet by mouth twice daily as needed What changed: reasons to take this   amLODipine 10 MG tablet Commonly known as: NORVASC Take 0.5 tablets (5 mg total) by mouth daily.   aspirin 81 MG EC tablet Take 81 mg by mouth daily.   buPROPion  300 MG 24 hr tablet Commonly known as: WELLBUTRIN XL Take 1 tablet (300 mg total) by mouth daily.   carvedilol 6.25 MG tablet Commonly known as: COREG Take 1 tablet (6.25 mg total) by mouth 2 (two) times daily with a meal.   Cholecalciferol 50 MCG (2000 UT) Tabs Take 2,000 Units by mouth daily.   clopidogrel 75 MG tablet Commonly known as: PLAVIX Take 1 tablet (75 mg total) by mouth daily. Start taking on: September 13, 2019   diphenoxylate-atropine 2.5-0.025 MG tablet Commonly known as: LOMOTIL Take 1 tablet by mouth 4 (four) times daily as needed for diarrhea or loose stools. What changed: See the new instructions.   Euthyrox 25 MCG  tablet Generic drug: levothyroxine TAKE 1 TABLET BY MOUTH ONCE DAILY BEFORE BREAKFAST What changed: See the new instructions.   gabapentin 300 MG capsule Commonly known as: NEURONTIN Take 1 capsule (300 mg total) by mouth 2 (two) times daily.   isosorbide mononitrate 30 MG 24 hr tablet Commonly known as: IMDUR Take 3 tablets (90 mg total) by mouth daily. Start taking on: September 13, 2019 What changed:   medication strength  how much to take   losartan 25 MG tablet Commonly known as: COZAAR Take 25 mg by mouth daily.   Multi-Vitamin tablet Take 1 tablet by mouth daily.   nitroGLYCERIN 0.4 MG SL tablet Commonly known as: NITROSTAT Place 1 tablet (0.4 mg total) under the tongue every 5 (five) minutes as needed for chest pain.   pantoprazole 40 MG tablet Commonly known as: PROTONIX Take 1 tablet by mouth twice daily   promethazine 25 MG tablet Commonly known as: PHENERGAN Take 25 mg by mouth every 6 (six) hours as needed for nausea or vomiting.   rosuvastatin 5 MG tablet Commonly known as: CRESTOR Take 1 tablet (5 mg total) by mouth daily at 6 PM.   traZODone 100 MG tablet Commonly known as: DESYREL Take 100-200 mg by mouth at bedtime as needed for sleep.   venlafaxine XR 75 MG 24 hr capsule Commonly known as: EFFEXOR-XR Take 2 capsules (150 mg total) by mouth daily with breakfast.      Allergies  Allergen Reactions  . Lipitor [Atorvastatin] Other (See Comments)    Leg pains  . Tramadol Other (See Comments)    Mouth feels like it's on fire    Follow-up Information    [name], MD Follow up in 5 day(s).   Specialty: Family Medicine Why: Please follow up within 3-5 days of discharge.  Contact information: [address] [place] [address] [phone]        Minna Merritts, MD .   Specialty: Cardiology Contact information: [place] [address] [phone]                The results  of significant diagnostics from this hospitalization (including imaging, microbiology, ancillary and laboratory) are listed below for reference.    Significant Diagnostic Studies: DG Chest 2 View  Result Date: 09/01/2019 CLINICAL DATA:  Chest pain EXAM: CHEST - 2 VIEW COMPARISON:  April 18, 2019 FINDINGS: Lungs are clear. Heart size and pulmonary vascularity are normal. Right-sided aortic arch noted. Postoperative change in the upper left paratracheal region noted. No adenopathy. No bone lesions. IMPRESSION: Right-sided aortic arch.  Heart size normal.  Lungs clear. Electronically Signed   By: Lowella Grip III M.D.   On: 09/01/2019 08:45   MR ABDOMEN W WO CONTRAST  Result  Date: 09/06/2019 CLINICAL DATA:  Inpatient. Recurrent hypoglycemia. Concern for insulinoma. EXAM: MRI ABDOMEN WITHOUT AND WITH CONTRAST TECHNIQUE: Multiplanar multisequence MR imaging of the abdomen was performed both before and after the administration of intravenous contrast. CONTRAST:  53mL GADAVIST GADOBUTROL 1 MMOL/ML IV SOLN COMPARISON:  03/19/2017 CT abdomen/pelvis. FINDINGS: Lower chest: No acute abnormality at the lung bases. Hepatobiliary: Normal liver size and configuration. No hepatic steatosis. No liver mass. Cholecystectomy. No biliary ductal dilatation. Common bile duct diameter 5 mm. No choledocholithiasis. No biliary masses, strictures or beading. Pancreas: No pancreatic mass or duct dilation. No foci of pancreatic hyperenhancement. No pancreas divisum. Spleen: Normal size. No mass. Adrenals/Urinary Tract: Normal adrenals. No hydronephrosis. Normal kidneys with no renal mass. Stomach/Bowel: Expected postsurgical changes from Roux-en-Y gastric bypass surgery with gastrojejunostomy and collapsed excluded distal stomach with no acute gastric abnormality. Visualized small and large bowel is normal caliber, with no bowel wall thickening. Vascular/Lymphatic: Normal caliber abdominal aorta. Patent portal, splenic, hepatic and  renal veins. No pathologically enlarged lymph nodes in the abdomen. Other: No abdominal ascites or focal fluid collection. Musculoskeletal: No aggressive appearing focal osseous lesions. IMPRESSION: 1. No evidence of pancreatic mass or other pathologic mass in the abdomen. 2. Expected postsurgical changes from Roux-en-Y gastric bypass surgery. 3. Cholecystectomy. No biliary ductal dilatation. No choledocholithiasis. Electronically Signed   By: Ilona Sorrel M.D.   On: 09/06/2019 08:15   DG Chest Port 1 View  Result Date: 09/08/2019 CLINICAL DATA:  Hypoglycemia.  Shortness of breath. EXAM: PORTABLE CHEST 1 VIEW COMPARISON:  04/18/2019.  10/12/2018. FINDINGS: Mediastinum and hilar structures normal. Heart size stable. Right-sided aortic arch again noted. No pulmonary venous congestion. Low lung volumes. Mild right base subsegmental atelectasis. No focal alveolar infiltrate. No pleural effusion or pneumothorax. Surgical clips noted over the left upper chest. Degenerative change thoracic spine. IMPRESSION: 1.  Mild right base subsegmental atelectasis. 2. Right-sided aortic arch again noted. Heart size stable. No pulmonary venous congestion. Electronically Signed   By: Marcello Moores  Register   On: 09/08/2019 09:39    Microbiology: Recent Results (from the past 240 hour(s))  Urine Culture     Status: Abnormal   Collection Time: 09/02/19  7:27 PM   Specimen: Urine, Random  Result Value Ref Range Status   Specimen Description   Final    URINE, RANDOM Performed at [facility], [address] [place], [place] [address]    Special Requests   Final    NONE Performed at [facility], [address] [place], [place] [address]    Culture MULTIPLE SPECIES PRESENT, SUGGEST RECOLLECTION (A)  Final   Report Status 09/04/2019 FINAL  Final  MRSA PCR Screening     Status: Abnormal   Collection Time: 09/03/19 12:51 PM   Specimen: Nasopharyngeal  Result Value Ref Range Status   MRSA by PCR POSITIVE  (A) NEGATIVE Final    Comment:        The GeneXpert MRSA Assay (FDA approved for NASAL specimens only), is one component of a comprehensive MRSA colonization surveillance program. It is not intended to diagnose MRSA infection nor to guide or monitor treatment for MRSA infections. RESULT CALLED TO, READ BACK BY AND VERIFIED WITH: Talbot Grumbling RN AT 4431 ON 09/03/19 [facility] Performed at [facility], [address] [place], [place] [address]      Labs: Basic Metabolic Panel: Recent Labs  Lab 09/06/19 0640 09/07/19 0536 09/08/19 0713 09/09/19 0913  NA 132* 135 136 134*  K 3.6  4.3 4.3 4.2  CL 100 102 104 99  CO2 25 26 24 24   GLUCOSE 109* 80 90 181*  BUN 12 14 18 13   CREATININE 1.13* 1.22* 1.40* 1.18*  CALCIUM 8.0* 8.9 8.5* 8.6*   Liver Function Tests: No results for input(s): AST, ALT, ALKPHOS, BILITOT, PROT, ALBUMIN in the last 168 hours. No results for input(s): LIPASE, AMYLASE in the last 168 hours. No results for input(s): AMMONIA in the last 168 hours. CBC: Recent Labs  Lab 09/06/19 0640 09/07/19 0536 09/08/19 0713  WBC 7.7 8.7 10.5  HGB 11.4* 12.4 11.7*  HCT 32.7* 36.6 35.8*  MCV 80.5 82.4 84.8  PLT 171 177 170   Cardiac Enzymes: No results for input(s): CKTOTAL, CKMB, CKMBINDEX, TROPONINI in the last 168 hours. BNP: BNP (last 3 results) Recent Labs    10/12/18 0944 01/27/19 2107 04/18/19 0910  BNP 52.2 40.0 114.0*    ProBNP (last 3 results) No results for input(s): PROBNP in the last 8760 hours.  CBG: Recent Labs  Lab 09/11/19 1959 09/11/19 2317 09/12/19 0330 09/12/19 0827 09/12/19 1240  GLUCAP 183* 76 87 86 115*       Signed:  Alma Friendly, MD [facility] 09/12/2019, 1:04 PM

## 2019-09-12 NOTE — Progress Notes (Signed)
Per Dr Ezenduka d/c tele monitor 

## 2019-09-12 NOTE — TOC Transition Note (Signed)
Transition of Care [employer]) - CM/SW Discharge Note   Patient Details  Name: [name] MRN: [id number] Date of Birth: 06-22-1970  Transition of Care [facility]) CM/SW Contact:  Shelbie Hutching, RN Phone Number: 09/12/2019, 2:01 PM   Clinical Narrative:    Patient is medically stable for discharge home with home health services.  Patient agreed to home health RN, PT, and OT. Floydene Flock with [facility] is aware of discharge today.     Final next level of care: Home w Home Health Services Barriers to Discharge: Barriers Resolved   Patient Goals and CMS Choice   CMS Medicare.gov Compare Post Acute Care list provided to:: Patient Choice offered to / list presented to : Patient  Discharge Placement                       Discharge Plan and Services   Discharge Planning Services: CM Consult Post Acute Care Choice: Home Health                    HH Arranged: RN, PT, OT [facility] Agency: [place] ([place]) Date [facility] Agency Contacted: 09/12/19 Time [place]: 1400 Representative spoke with at [place]: [place] (SDOH) Interventions     Readmission Risk Interventions Readmission Risk Prevention Plan 09/05/2019 06/26/2018 06/16/2018  Transportation Screening Complete Complete Complete  Medication Review Press photographer) Complete Complete Complete  PCP or Specialist appointment within 3-5 days of discharge Complete - Complete  HRI or Home Care Consult - Complete Complete  SW Recovery Care/Counseling Consult Complete - Not Complete  SW Consult Not Complete Comments - - NA  Palliative Care Screening Not Applicable - Not [place] Not Applicable - Not Applicable  Some recent data might be hidden

## 2019-09-13 ENCOUNTER — Telehealth: Payer: Self-pay

## 2019-09-13 NOTE — Telephone Encounter (Signed)
No HFU scheduled however a 1 month f/u is scheduled for 09/27/19 @ 8:00 AM.

## 2019-09-13 NOTE — Telephone Encounter (Signed)
Transition Care Management Follow-up Telephone Call  Date of discharge and from where: [facility] on 09/12/19  How have you been since you were released from the hospital? Doing better, has been checking BS regularly. Lowest reading was 90 this morning. It was 108 right before eating lunch today. Pt states she is still weak, no better or worse. Pt is still having chest pain but states that the hospital doctors were aware of this prior to d/c. Pt states this is due to a blocked artery that needs a stent. Pt states the hospital team plans to get together and decide a plan. Pt to f/u with cardiology within the next month. Declines any other pains, SOB, fever, cough or n/v/d.  Any questions or concerns? Yes, pt would like to know what caused the low blood sugar readings. Pt was advised it was from not eating but pt states she does eat and doesn't think that is the cause.  Items Reviewed:  Did the pt receive and understand the discharge instructions provided? Yes   Medications obtained and verified? No, pt declined reviewing at this time.   Any new allergies since your discharge? No   Dietary orders reviewed? Yes  Do you have support at home? Yes   Other (ie: DME, Home Health, etc): PT and a home health nurse was ordered.  Functional Questionnaire: (I = Independent and D = Dependent)  Bathing/Dressing- I   Meal Prep- I  Eating- I  Maintaining continence- I  Transferring/Ambulation- Occasionally uses a cane or walker.  Managing Meds- I   Follow up appointments reviewed:    PCP Hospital f/u appt confirmed? Yes  scheduled to see Dr Caryn Section on 09/27/19 @ 8:00 AM.  [facility] f/u appt confirmed? Yes    Are transportation arrangements needed? No   If their condition worsens, is the pt aware to call  their PCP or go to the ED? Yes  Was the patient provided with contact information for the PCP's office or ED? Yes  Was the pt encouraged to call back with questions or concerns?  Yes

## 2019-09-14 ENCOUNTER — Telehealth: Payer: Self-pay | Admitting: Family Medicine

## 2019-09-14 DIAGNOSIS — E1142 Type 2 diabetes mellitus with diabetic polyneuropathy: Secondary | ICD-10-CM | POA: Diagnosis not present

## 2019-09-14 DIAGNOSIS — G709 Myoneural disorder, unspecified: Secondary | ICD-10-CM | POA: Diagnosis not present

## 2019-09-14 DIAGNOSIS — K529 Noninfective gastroenteritis and colitis, unspecified: Secondary | ICD-10-CM | POA: Diagnosis not present

## 2019-09-14 DIAGNOSIS — Z9884 Bariatric surgery status: Secondary | ICD-10-CM | POA: Diagnosis not present

## 2019-09-14 DIAGNOSIS — K259 Gastric ulcer, unspecified as acute or chronic, without hemorrhage or perforation: Secondary | ICD-10-CM | POA: Diagnosis not present

## 2019-09-14 DIAGNOSIS — E1122 Type 2 diabetes mellitus with diabetic chronic kidney disease: Secondary | ICD-10-CM | POA: Diagnosis not present

## 2019-09-14 DIAGNOSIS — F418 Other specified anxiety disorders: Secondary | ICD-10-CM | POA: Diagnosis not present

## 2019-09-14 DIAGNOSIS — Z87440 Personal history of urinary (tract) infections: Secondary | ICD-10-CM | POA: Diagnosis not present

## 2019-09-14 DIAGNOSIS — E11649 Type 2 diabetes mellitus with hypoglycemia without coma: Secondary | ICD-10-CM | POA: Diagnosis not present

## 2019-09-14 DIAGNOSIS — I129 Hypertensive chronic kidney disease with stage 1 through stage 4 chronic kidney disease, or unspecified chronic kidney disease: Secondary | ICD-10-CM | POA: Diagnosis not present

## 2019-09-14 DIAGNOSIS — S43431D Superior glenoid labrum lesion of right shoulder, subsequent encounter: Secondary | ICD-10-CM | POA: Diagnosis not present

## 2019-09-14 DIAGNOSIS — I69354 Hemiplegia and hemiparesis following cerebral infarction affecting left non-dominant side: Secondary | ICD-10-CM | POA: Diagnosis not present

## 2019-09-14 DIAGNOSIS — D126 Benign neoplasm of colon, unspecified: Secondary | ICD-10-CM | POA: Diagnosis not present

## 2019-09-14 DIAGNOSIS — Z8781 Personal history of (healed) traumatic fracture: Secondary | ICD-10-CM | POA: Diagnosis not present

## 2019-09-14 DIAGNOSIS — N1831 Chronic kidney disease, stage 3a: Secondary | ICD-10-CM | POA: Diagnosis not present

## 2019-09-14 DIAGNOSIS — D509 Iron deficiency anemia, unspecified: Secondary | ICD-10-CM | POA: Diagnosis not present

## 2019-09-14 DIAGNOSIS — R296 Repeated falls: Secondary | ICD-10-CM | POA: Diagnosis not present

## 2019-09-14 DIAGNOSIS — Z95828 Presence of other vascular implants and grafts: Secondary | ICD-10-CM | POA: Diagnosis not present

## 2019-09-14 DIAGNOSIS — R1310 Dysphagia, unspecified: Secondary | ICD-10-CM | POA: Diagnosis not present

## 2019-09-14 DIAGNOSIS — F0280 Dementia in other diseases classified elsewhere without behavioral disturbance: Secondary | ICD-10-CM | POA: Diagnosis not present

## 2019-09-14 DIAGNOSIS — I712 Thoracic aortic aneurysm, without rupture: Secondary | ICD-10-CM | POA: Diagnosis not present

## 2019-09-14 DIAGNOSIS — I2510 Atherosclerotic heart disease of native coronary artery without angina pectoris: Secondary | ICD-10-CM | POA: Diagnosis not present

## 2019-09-14 DIAGNOSIS — E162 Hypoglycemia, unspecified: Secondary | ICD-10-CM | POA: Diagnosis not present

## 2019-09-14 DIAGNOSIS — I252 Old myocardial infarction: Secondary | ICD-10-CM | POA: Diagnosis not present

## 2019-09-14 DIAGNOSIS — E039 Hypothyroidism, unspecified: Secondary | ICD-10-CM | POA: Diagnosis not present

## 2019-09-14 DIAGNOSIS — Z85828 Personal history of other malignant neoplasm of skin: Secondary | ICD-10-CM | POA: Diagnosis not present

## 2019-09-14 NOTE — Telephone Encounter (Signed)
Ollen Bowl PT was [place] is calling to report an allergy alert Rosuvastatin & Liptor  And wanted verbal approval on PT one time a week for one week, two times a week for 4 weeks, one time a week for 3 weeks. Ok to leave verbal on VM. CB- [phone]

## 2019-09-14 NOTE — Telephone Encounter (Signed)
Verbal ok for PT  Patient does not have true allergy to lipitor, just myalgias.

## 2019-09-15 DIAGNOSIS — E161 Other hypoglycemia: Secondary | ICD-10-CM | POA: Diagnosis not present

## 2019-09-15 LAB — SULFONYLUREA HYPOGLYCEMICS PANEL, SERUM
Acetohexamide: NEGATIVE ug/mL (ref 20–60)
Chlorpropamide: NEGATIVE ug/mL (ref 75–250)
Glimepiride: NEGATIVE ng/mL (ref 80–250)
Glipizide: 495 ng/mL (ref 200–1000)
Glyburide: NEGATIVE ng/mL
Nateglinide: NEGATIVE ng/mL
Repaglinide: NEGATIVE ng/mL
Tolazamide: NEGATIVE ug/mL
Tolbutamide: NEGATIVE ug/mL (ref 40–100)

## 2019-09-15 NOTE — Telephone Encounter (Signed)
Verbal order given to [facility] with [facility]. Merry Proud states he just has to report that patient has a intolerance to Lipitor- leg pain.

## 2019-09-19 ENCOUNTER — Other Ambulatory Visit: Payer: Self-pay | Admitting: Family Medicine

## 2019-09-19 DIAGNOSIS — E039 Hypothyroidism, unspecified: Secondary | ICD-10-CM

## 2019-09-19 DIAGNOSIS — E1122 Type 2 diabetes mellitus with diabetic chronic kidney disease: Secondary | ICD-10-CM | POA: Diagnosis not present

## 2019-09-19 DIAGNOSIS — E1142 Type 2 diabetes mellitus with diabetic polyneuropathy: Secondary | ICD-10-CM | POA: Diagnosis not present

## 2019-09-19 DIAGNOSIS — E11649 Type 2 diabetes mellitus with hypoglycemia without coma: Secondary | ICD-10-CM | POA: Diagnosis not present

## 2019-09-19 DIAGNOSIS — I2510 Atherosclerotic heart disease of native coronary artery without angina pectoris: Secondary | ICD-10-CM | POA: Diagnosis not present

## 2019-09-19 DIAGNOSIS — I129 Hypertensive chronic kidney disease with stage 1 through stage 4 chronic kidney disease, or unspecified chronic kidney disease: Secondary | ICD-10-CM | POA: Diagnosis not present

## 2019-09-19 DIAGNOSIS — I252 Old myocardial infarction: Secondary | ICD-10-CM | POA: Diagnosis not present

## 2019-09-20 ENCOUNTER — Ambulatory Visit
Admission: RE | Admit: 2019-09-20 | Discharge: 2019-09-20 | Disposition: A | Discharge status: Home / Self Care | Payer: Medicare Other | Source: Ambulatory Visit | Attending: Otolaryngology | Admitting: Otolaryngology

## 2019-09-20 ENCOUNTER — Ambulatory Visit: Payer: Medicare Other

## 2019-09-20 ENCOUNTER — Other Ambulatory Visit: Payer: Self-pay

## 2019-09-20 DIAGNOSIS — R1310 Dysphagia, unspecified: Secondary | ICD-10-CM | POA: Diagnosis not present

## 2019-09-20 DIAGNOSIS — I6521 Occlusion and stenosis of right carotid artery: Secondary | ICD-10-CM | POA: Diagnosis not present

## 2019-09-20 DIAGNOSIS — Q278 Other specified congenital malformations of peripheral vascular system: Secondary | ICD-10-CM | POA: Diagnosis not present

## 2019-09-20 DIAGNOSIS — J392 Other diseases of pharynx: Secondary | ICD-10-CM

## 2019-09-20 MED ORDER — IOHEXOL 300 MG/ML  SOLN
100.0000 mL | Freq: Once | INTRAMUSCULAR | Status: AC | PRN
  Administered 2019-09-20: 75 mL via INTRAVENOUS

## 2019-09-21 DIAGNOSIS — I252 Old myocardial infarction: Secondary | ICD-10-CM | POA: Diagnosis not present

## 2019-09-21 DIAGNOSIS — I129 Hypertensive chronic kidney disease with stage 1 through stage 4 chronic kidney disease, or unspecified chronic kidney disease: Secondary | ICD-10-CM | POA: Diagnosis not present

## 2019-09-21 DIAGNOSIS — E11649 Type 2 diabetes mellitus with hypoglycemia without coma: Secondary | ICD-10-CM | POA: Diagnosis not present

## 2019-09-21 DIAGNOSIS — E1122 Type 2 diabetes mellitus with diabetic chronic kidney disease: Secondary | ICD-10-CM | POA: Diagnosis not present

## 2019-09-21 DIAGNOSIS — E1142 Type 2 diabetes mellitus with diabetic polyneuropathy: Secondary | ICD-10-CM | POA: Diagnosis not present

## 2019-09-21 DIAGNOSIS — I2510 Atherosclerotic heart disease of native coronary artery without angina pectoris: Secondary | ICD-10-CM | POA: Diagnosis not present

## 2019-09-22 DIAGNOSIS — I252 Old myocardial infarction: Secondary | ICD-10-CM | POA: Diagnosis not present

## 2019-09-22 DIAGNOSIS — B351 Tinea unguium: Secondary | ICD-10-CM | POA: Diagnosis not present

## 2019-09-22 DIAGNOSIS — E1122 Type 2 diabetes mellitus with diabetic chronic kidney disease: Secondary | ICD-10-CM | POA: Diagnosis not present

## 2019-09-22 DIAGNOSIS — E11649 Type 2 diabetes mellitus with hypoglycemia without coma: Secondary | ICD-10-CM | POA: Diagnosis not present

## 2019-09-22 DIAGNOSIS — I2510 Atherosclerotic heart disease of native coronary artery without angina pectoris: Secondary | ICD-10-CM | POA: Diagnosis not present

## 2019-09-22 DIAGNOSIS — E161 Other hypoglycemia: Secondary | ICD-10-CM | POA: Diagnosis not present

## 2019-09-22 DIAGNOSIS — E1142 Type 2 diabetes mellitus with diabetic polyneuropathy: Secondary | ICD-10-CM | POA: Diagnosis not present

## 2019-09-22 DIAGNOSIS — I129 Hypertensive chronic kidney disease with stage 1 through stage 4 chronic kidney disease, or unspecified chronic kidney disease: Secondary | ICD-10-CM | POA: Diagnosis not present

## 2019-09-23 ENCOUNTER — Encounter: Payer: Self-pay | Admitting: Family Medicine

## 2019-09-24 ENCOUNTER — Other Ambulatory Visit: Payer: Self-pay | Admitting: Family Medicine

## 2019-09-24 NOTE — Telephone Encounter (Signed)
Requested medication (s) are due for refill today: -  Requested medication (s) are on the active medication list: yes  Last refill:  09/12/19  Future visit scheduled: yes  Notes to clinic:  this refill cannot be delegated to NT to RF   Requested Prescriptions  Pending Prescriptions Disp Refills   diphenoxylate-atropine (LOMOTIL) 2.5-0.025 MG tablet [Pharmacy Med Name: Diphenoxylate-Atropine 2.5-0.025 MG Oral Tablet] 30 tablet 0    Sig: TAKE 1 TABLET BY MOUTH ONCE TO 4 TIMES DAILY FOR DIARRHEA      Not Delegated - Gastroenterology:  Antidiarrheals Failed - 09/24/2019  9:42 AM      Failed - This refill cannot be delegated      Passed - Valid encounter within last 12 months    Recent Outpatient Visits           1 month ago [place], Donald E, MD   1 month ago Encounter for [employer] annual wellness exam   [facility] Miller, [place], LPN   1 month ago Sore throat   [facility] Carles Collet M, [place]   4 months ago Chest pain, unspecified type   [facility] [name], MD   6 months ago [place], Donald E, MD       Future Appointments             In 3 days Fisher, Kirstie Peri, MD [facility], [place]   In 2 weeks Gollan, Kathlene November, MD [facility], [place]

## 2019-09-25 ENCOUNTER — Encounter: Payer: Self-pay | Admitting: Family Medicine

## 2019-09-25 DIAGNOSIS — E11649 Type 2 diabetes mellitus with hypoglycemia without coma: Secondary | ICD-10-CM | POA: Diagnosis not present

## 2019-09-25 DIAGNOSIS — I252 Old myocardial infarction: Secondary | ICD-10-CM | POA: Diagnosis not present

## 2019-09-25 DIAGNOSIS — I2510 Atherosclerotic heart disease of native coronary artery without angina pectoris: Secondary | ICD-10-CM | POA: Diagnosis not present

## 2019-09-25 DIAGNOSIS — E1142 Type 2 diabetes mellitus with diabetic polyneuropathy: Secondary | ICD-10-CM | POA: Diagnosis not present

## 2019-09-25 DIAGNOSIS — I129 Hypertensive chronic kidney disease with stage 1 through stage 4 chronic kidney disease, or unspecified chronic kidney disease: Secondary | ICD-10-CM | POA: Diagnosis not present

## 2019-09-25 DIAGNOSIS — E1122 Type 2 diabetes mellitus with diabetic chronic kidney disease: Secondary | ICD-10-CM | POA: Diagnosis not present

## 2019-09-25 NOTE — Telephone Encounter (Signed)
If there's any room in my schedule or [name]'s can we add her on as a virtual?

## 2019-09-27 ENCOUNTER — Other Ambulatory Visit: Payer: Self-pay

## 2019-09-27 ENCOUNTER — Encounter: Payer: Self-pay | Admitting: Family Medicine

## 2019-09-27 ENCOUNTER — Ambulatory Visit (INDEPENDENT_AMBULATORY_CARE_PROVIDER_SITE_OTHER): Payer: Medicare Other | Admitting: Family Medicine

## 2019-09-27 VITALS — BP 101/62 | HR 61 | Temp 98.1°F | Resp 16 | Wt 153.0 lb

## 2019-09-27 DIAGNOSIS — I2510 Atherosclerotic heart disease of native coronary artery without angina pectoris: Secondary | ICD-10-CM | POA: Diagnosis not present

## 2019-09-27 DIAGNOSIS — I255 Ischemic cardiomyopathy: Secondary | ICD-10-CM

## 2019-09-27 DIAGNOSIS — N183 Chronic kidney disease, stage 3 unspecified: Secondary | ICD-10-CM | POA: Diagnosis not present

## 2019-09-27 DIAGNOSIS — E11649 Type 2 diabetes mellitus with hypoglycemia without coma: Secondary | ICD-10-CM | POA: Diagnosis not present

## 2019-09-27 DIAGNOSIS — I252 Old myocardial infarction: Secondary | ICD-10-CM | POA: Diagnosis not present

## 2019-09-27 DIAGNOSIS — F321 Major depressive disorder, single episode, moderate: Secondary | ICD-10-CM

## 2019-09-27 DIAGNOSIS — Z23 Encounter for immunization: Secondary | ICD-10-CM

## 2019-09-27 DIAGNOSIS — F419 Anxiety disorder, unspecified: Secondary | ICD-10-CM

## 2019-09-27 DIAGNOSIS — R1310 Dysphagia, unspecified: Secondary | ICD-10-CM

## 2019-09-27 DIAGNOSIS — E1122 Type 2 diabetes mellitus with diabetic chronic kidney disease: Secondary | ICD-10-CM

## 2019-09-27 DIAGNOSIS — I129 Hypertensive chronic kidney disease with stage 1 through stage 4 chronic kidney disease, or unspecified chronic kidney disease: Secondary | ICD-10-CM | POA: Diagnosis not present

## 2019-09-27 DIAGNOSIS — E1142 Type 2 diabetes mellitus with diabetic polyneuropathy: Secondary | ICD-10-CM | POA: Diagnosis not present

## 2019-09-27 IMAGING — CT CT HEAD W/O CM
3 of 4 series · 14 of 47 positions shown, 16 images · non-contrast
Comparison: Head CT April 09, 2017 and brain MRI April 10, 2017

CLINICAL DATA: Headache and blurred vision following fall. Syncope.

EXAM:
CT HEAD WITHOUT CONTRAST
TECHNIQUE: Contiguous axial images were obtained from the base of the skull
through the vertex without intravenous contrast.

[Series 4: head · axial · 0.34mm/px · z∈[-573,-469]mm · 8 of 66 slices shown, 10 images (1 of 3)]
[im 7/66  brain]
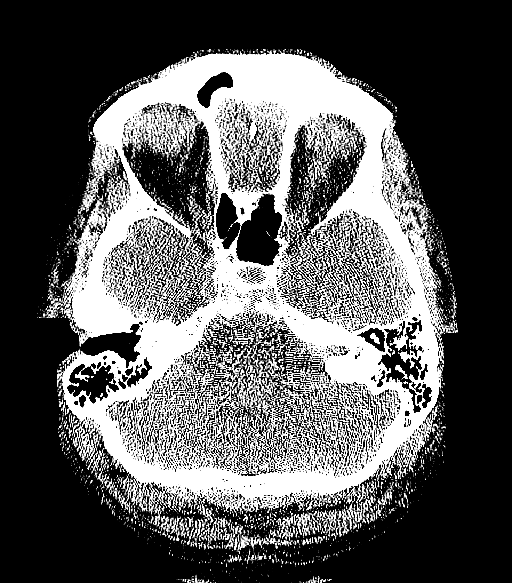
[im 7/66  bone]
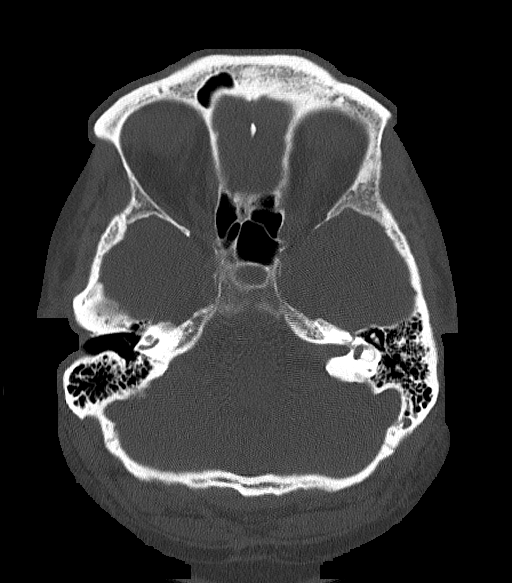
[im 13/66  brain]
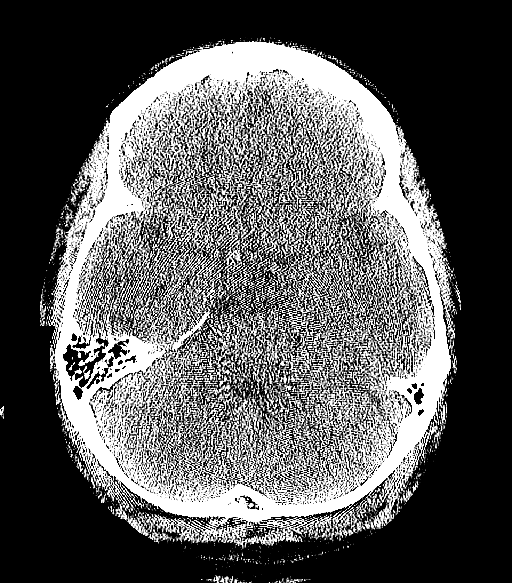
[im 22/66  brain]
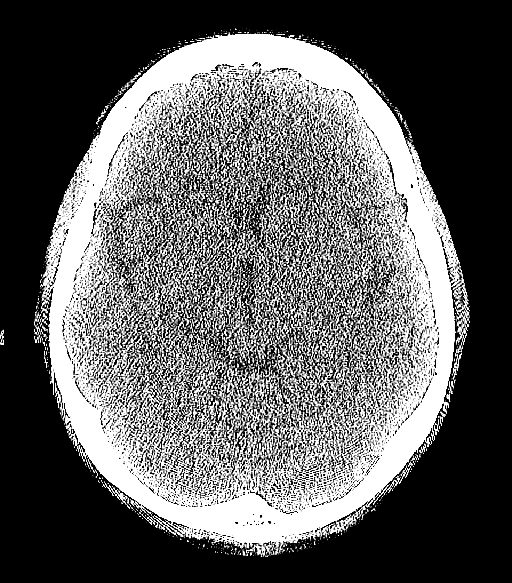
[im 28/66  brain]
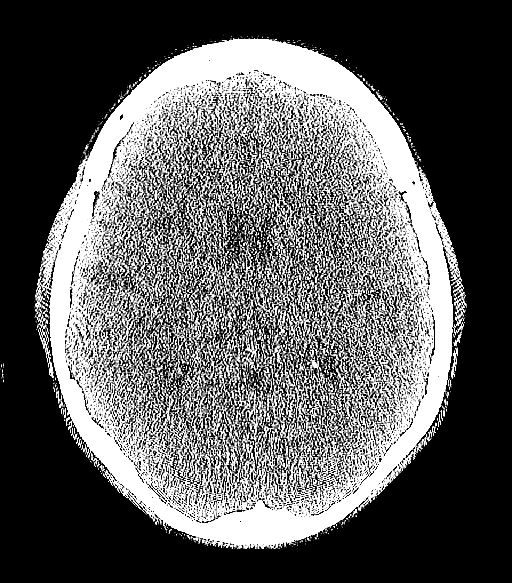
[im 38/66  brain]
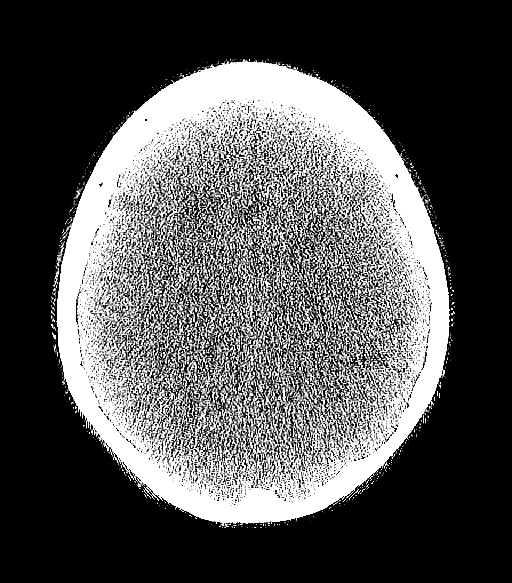
[im 38/66  bone]
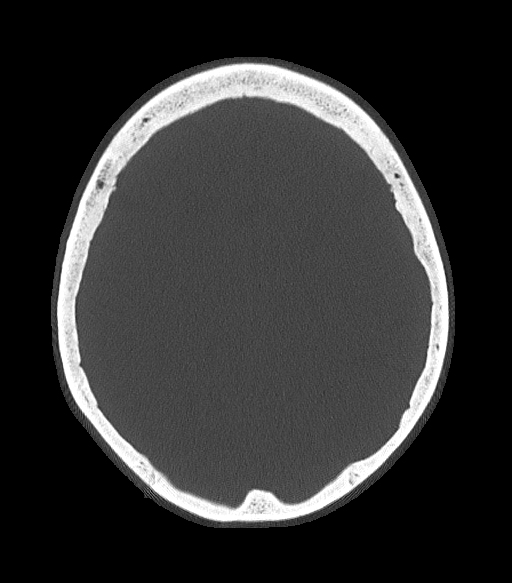
[im 44/66  brain]
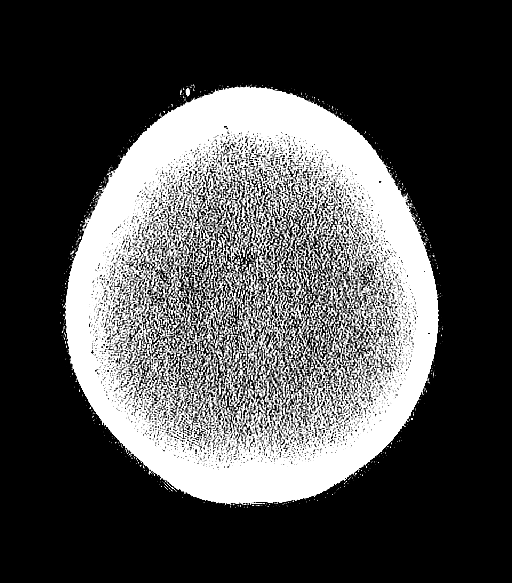
[im 53/66  brain]
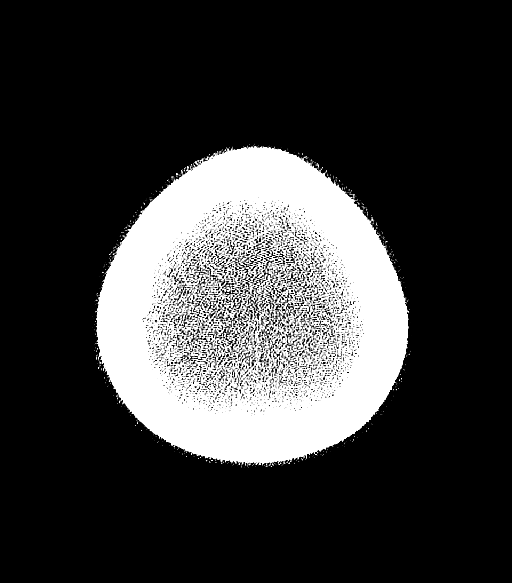
[im 59/66  brain]
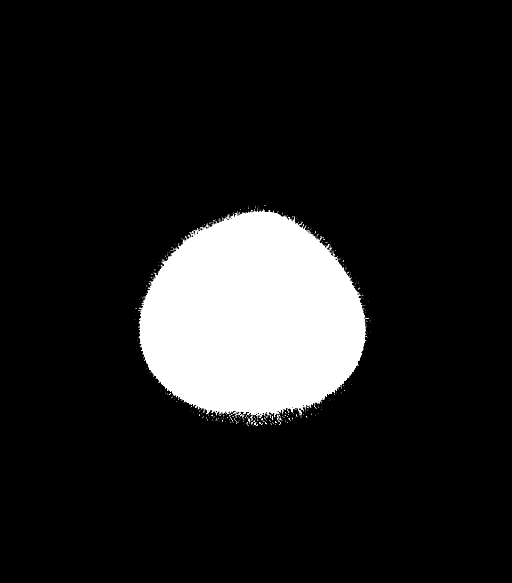

[Series 6: head · coronal · 0.26mm/px · 3 of 66 slices shown (2 of 3)]
[im 22/66  brain]
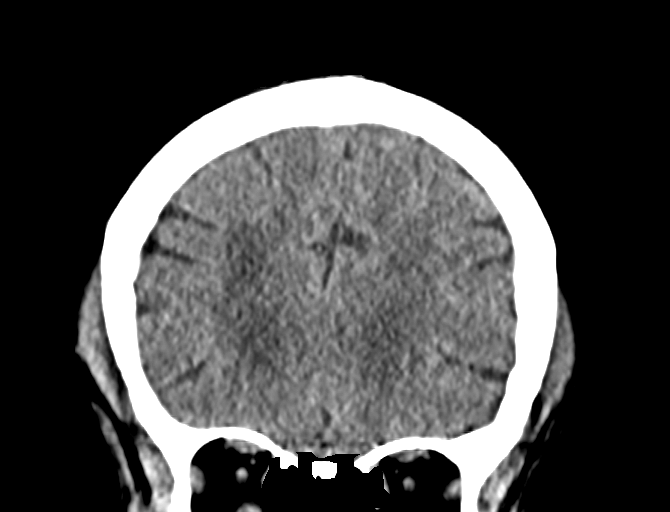
[im 29/66  brain]
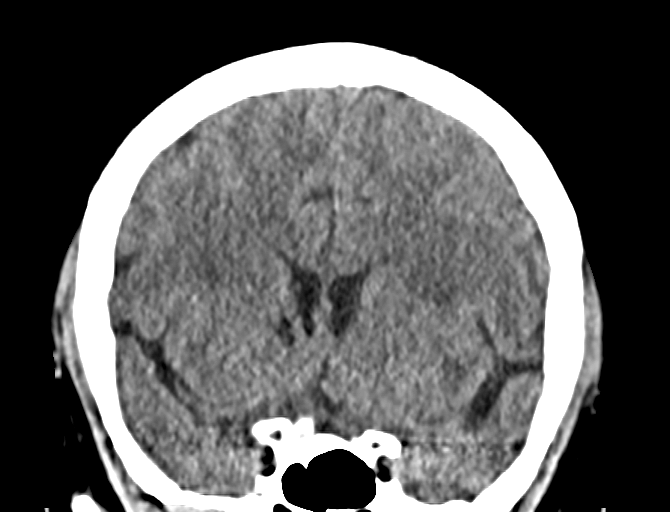
[im 37/66  brain]
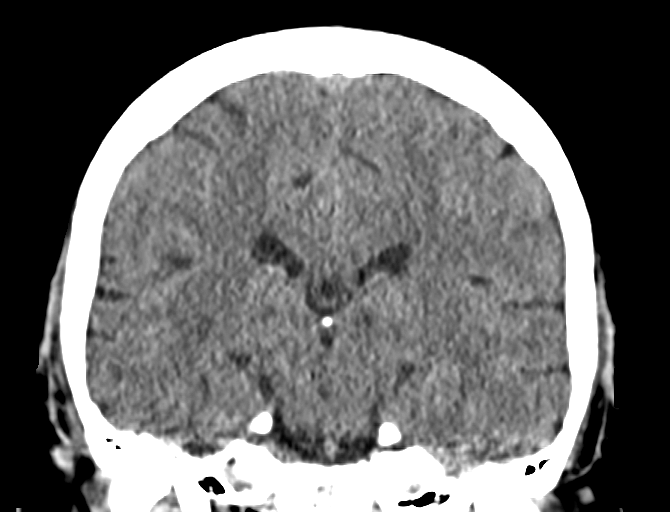

[Series 8: head · sagittal · 0.26mm/px · 3 of 58 slices shown (3 of 3)]
[im 20/58  brain]
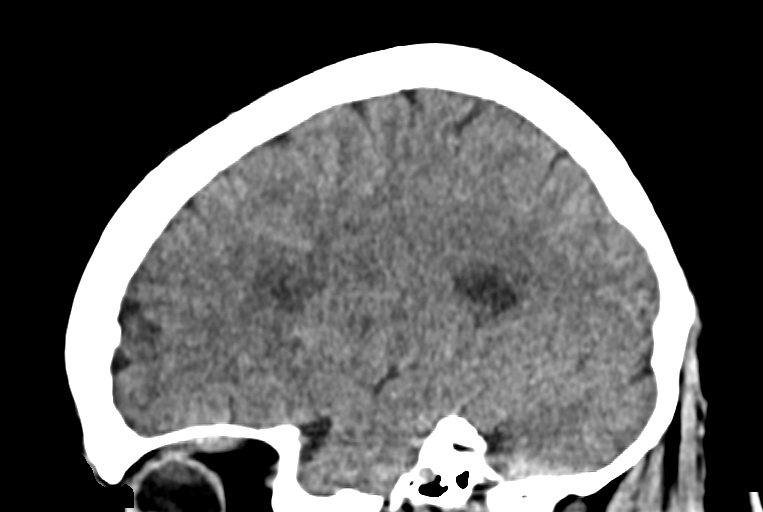
[im 29/58  brain]
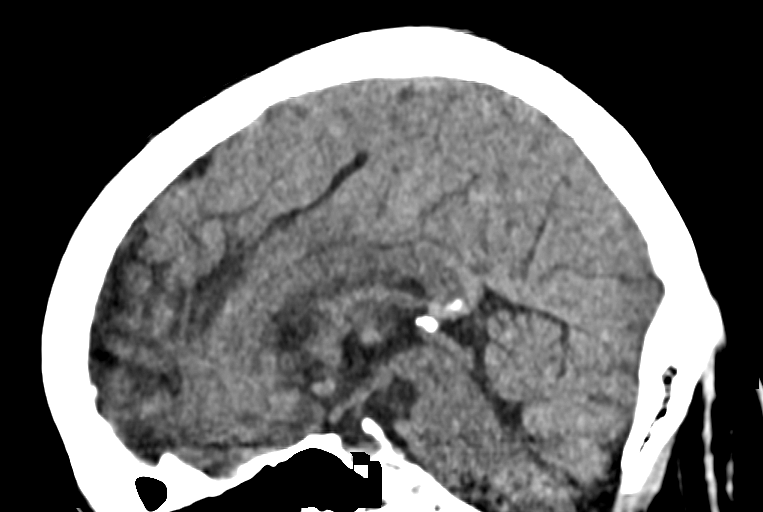
[im 39/58  brain]
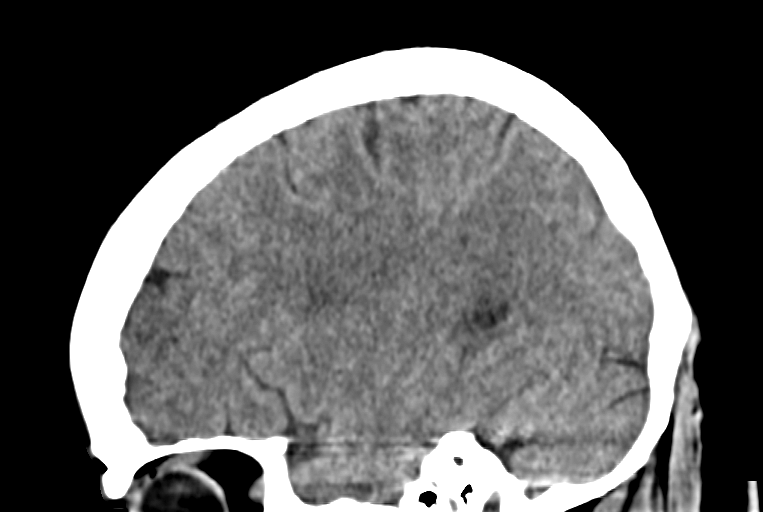

[14 of 47 positions shown; findings below may reference images not displayed]

FINDINGS: Brain: Ventricles are normal in size and configuration. There is no
intracranial mass, hemorrhage, extra-axial fluid collection, or
midline shift. There is patchy small vessel disease in the centra
semiovale bilaterally. There is evidence of a prior lacunar type
infarct in the genu of the right internal capsule. No new gray-white
compartment lesions are evident. No acute infarct appreciable.

Vascular: No hyperdense vessel. There is calcification in the
carotid siphon regions bilaterally.

Skull: Bony calvarium appears intact.

Sinuses/Orbits: There is mucosal thickening in several ethmoid air
cells. Other visualized paranasal sinuses are clear. Orbits appear
symmetric bilaterally.

Other: Mastoid air cells are clear.
IMPRESSION: Patchy supratentorial small vessel disease. Prior small infarct in
the right internal capsule with the genu level. No acute infarct
evident. No mass or hemorrhage.

There are foci of arterial vascular calcification. There is mucosal
thickening in several ethmoid air cells.

## 2019-09-27 NOTE — Progress Notes (Signed)
Established patient visit   Patient: [name]   DOB: 11-09-1970   49 y.o. Female  MRN: [id number] Visit Date: 09/27/2019  Today's healthcare provider: [name], MD   Chief Complaint  Patient presents with   Hospitalization Follow-up   Subjective    HPI  Follow up Hospitalization  Patient was admitted to [facility] on 09/02/2019 and discharged on 09/12/2019. She was treated for hypoglycemia and UTI. Treatment for this included -see discharge summary. Patient was encouraged to eat frequently, check her sugars at least 4 times a day, could use glucose tabs as needed Telephone follow up was done on 09/13/2019 She reports good compliance with treatment. She reports this condition is improved. Patient has been drinking protein drinks and eating at least 2 meals daily. She checks her blood sugars 4 times a day.  Home blood sugars are checked and average in the 70's.   She has follow up with Dr. [name] on 10-10-2019 and Dr. Honor Junes on 10-21-2019. Endocrinologist did start her on Acarbose last week and started on protein shakes. She is having trouble swallowing and not able to eat regular meals. Is being followed by ENT (Dr. Kathyrn Sheriff)  -----------------------------------------------------------------------------------------   Anxiety and Depression, Follow-up  She was last seen for anxiety 1 month ago. Changes made at last visit include increasing Venlafaxine.   She reports good compliance with treatment. She reports good tolerance of treatment. She is not having side effects.   She feels her Depression is moderate and Worse since last visit.  Symptoms: Yes chest pain Yes difficulty concentrating  No dizziness No fatigue  No feelings of losing control Yes insomnia  Yes irritable Yes palpitations  No panic attacks No racing thoughts  Yes shortness of breath No sweating  Yes tremors/shakes    GAD-7 Results GAD-7 Generalized Anxiety Disorder Screening Tool  08/22/2019  1. Feeling Nervous, Anxious, or on Edge 3  2. Not Being Able to Stop or Control Worrying 3  3. Worrying Too Much About Different Things 3  4. Trouble Relaxing 3  5. Being So Restless it's Hard To Sit Still 3  6. Becoming Easily Annoyed or Irritable 3  7. Feeling Afraid As If Something Awful Might Happen 3  Total GAD-7 Score 21  Difficulty At Work, Home, or Getting  Along With Others? Very difficult    PHQ-9 Scores PHQ9 SCORE ONLY 08/22/2019 08/22/2019 12/09/2018  PHQ-9 Total Score 23 20 25     She hasn't had any improvement in her anxiety and depression since increase venlafaxine, but she has been extremely frustrated and worried about her health and recent hospitalization. She states she does have appt with psychiatrist in a few weeks.  ----------------------------------------------------------------------------------------------------      Medications: Outpatient Medications Prior to Visit  Medication Sig   ALPRAZolam (XANAX) 1 MG tablet Take 1 tablet by mouth twice daily as needed (Patient taking differently: Take 1 mg by mouth 2 (two) times daily as needed for anxiety. )   amLODipine (NORVASC) 10 MG tablet Take 0.5 tablets (5 mg total) by mouth daily.   aspirin 81 MG EC tablet Take 81 mg by mouth daily.    buPROPion (WELLBUTRIN XL) 300 MG 24 hr tablet Take 1 tablet (300 mg total) by mouth daily.   carvedilol (COREG) 6.25 MG tablet Take 1 tablet (6.25 mg total) by mouth 2 (two) times daily with a meal.   Cholecalciferol 50 MCG (2000 UT) TABS Take 2,000 Units by mouth daily.   clopidogrel (PLAVIX)  75 MG tablet Take 1 tablet (75 mg total) by mouth daily.   diphenoxylate-atropine (LOMOTIL) 2.5-0.025 MG tablet TAKE 1 TABLET BY MOUTH ONCE TO 4 TIMES DAILY FOR DIARRHEA   EUTHYROX 25 MCG tablet TAKE 1 TABLET BY MOUTH ONCE DAILY BEFORE BREAKFAST   gabapentin (NEURONTIN) 300 MG capsule Take 1 capsule (300 mg total) by mouth 2 (two) times daily.   isosorbide mononitrate  (IMDUR) 30 MG 24 hr tablet Take 3 tablets (90 mg total) by mouth daily.   Multiple Vitamin (MULTI-VITAMIN) tablet Take 1 tablet by mouth daily.    nitroGLYCERIN (NITROSTAT) 0.4 MG SL tablet Place 1 tablet (0.4 mg total) under the tongue every 5 (five) minutes as needed for chest pain.   pantoprazole (PROTONIX) 40 MG tablet Take 1 tablet by mouth twice daily (Patient taking differently: Take 40 mg by mouth 2 (two) times daily. )   promethazine (PHENERGAN) 25 MG tablet Take 25 mg by mouth every 6 (six) hours as needed for nausea or vomiting.    rosuvastatin (CRESTOR) 5 MG tablet Take 1 tablet (5 mg total) by mouth daily at 6 PM.   traZODone (DESYREL) 100 MG tablet Take 100-200 mg by mouth at bedtime as needed for sleep.    venlafaxine XR (EFFEXOR-XR) 75 MG 24 hr capsule Take 2 capsules (150 mg total) by mouth daily with breakfast.   losartan (COZAAR) 25 MG tablet Take 25 mg by mouth daily. (Patient not taking: Reported on 09/27/2019)   No facility-administered medications prior to visit.    Review of Systems  Constitutional: Negative for appetite change, chills, fatigue and fever.  Respiratory: Negative for chest tightness and shortness of breath.   Cardiovascular: Negative for chest pain and palpitations.  Gastrointestinal: Negative for abdominal pain, nausea and vomiting.  Neurological: Negative for dizziness and weakness.      Objective    BP 101/62 (BP Location: Left Arm, Patient Position: Sitting, Cuff Size: Normal)    Pulse 61    Temp 98.1 F (36.7 C) (Oral)    Resp 16    Wt 153 lb (69.4 kg)    BMI 27.98 kg/m    Physical Exam  General appearance:  Overweight female, cooperative and in no acute distress Head: Normocephalic, without obvious abnormality, atraumatic Respiratory: Respirations even and unlabored, normal respiratory rate Extremities: All extremities are intact.  Skin: Skin color, texture, turgor normal. No rashes seen  Psych: Appropriate mood and  affect. Neurologic: Mental status: Alert, oriented to person, place, and time, thought content appropriate.     Assessment & Plan     1. Dysphagia, unspecified type Is being followed by ENT  2. Need for influenza vaccination  - Flu Vaccine QUAD 6+ mos PF IM (Fluarix Quad PF)  3. Anxiety No improvement since increasing venlafaxine, but exacerbated by serious health conditions.   4. Depression, major, single episode, moderate (HCC) Continue current medications until seen by psychiatry.   5. Hypertension associated with stage 3 chronic kidney disease due to type 2 diabetes mellitus ([place]) Recently started on acarbose by endocrine. Has follow up in September scheduled.         The entirety of the information documented in the History of Present Illness, Review of Systems and Physical Exam were personally obtained by me. Portions of this information were initially documented by the CMA and reviewed by me for thoroughness and accuracy.      [name], MD  [facility] [phone] (phone) [phone] (fax)  [place]

## 2019-09-27 NOTE — Patient Instructions (Signed)
.   Please review the attached list of medications and notify my office if there are any errors.   . Please bring all of your medications to every appointment so we can make sure that our medication list is the same as yours.   

## 2019-09-28 ENCOUNTER — Encounter: Payer: Self-pay | Admitting: Family

## 2019-09-28 ENCOUNTER — Other Ambulatory Visit: Payer: Self-pay

## 2019-09-28 ENCOUNTER — Ambulatory Visit (INDEPENDENT_AMBULATORY_CARE_PROVIDER_SITE_OTHER): Payer: Medicare Other | Admitting: Family

## 2019-09-28 VITALS — BP 118/80 | HR 81 | Ht 62.0 in | Wt 151.0 lb

## 2019-09-28 DIAGNOSIS — N183 Chronic kidney disease, stage 3 unspecified: Secondary | ICD-10-CM

## 2019-09-28 DIAGNOSIS — I952 Hypotension due to drugs: Secondary | ICD-10-CM

## 2019-09-28 DIAGNOSIS — I255 Ischemic cardiomyopathy: Secondary | ICD-10-CM | POA: Diagnosis not present

## 2019-09-28 DIAGNOSIS — I25118 Atherosclerotic heart disease of native coronary artery with other forms of angina pectoris: Secondary | ICD-10-CM

## 2019-09-28 DIAGNOSIS — I10 Essential (primary) hypertension: Secondary | ICD-10-CM | POA: Diagnosis not present

## 2019-09-28 DIAGNOSIS — E785 Hyperlipidemia, unspecified: Secondary | ICD-10-CM | POA: Diagnosis not present

## 2019-09-28 NOTE — Patient Instructions (Addendum)
Medication Instructions:  Your physician has recommended you make the following change in your medication:   STOP Amlodipine  *If you need a refill on your cardiac medications before your next appointment, please call your pharmacy*   Lab Work: No lab work today.  Testing/Procedures: Your EKG today shows normal sinus rhythm.  Follow-Up: At [facility], you and your health needs are our priority.  As part of our continuing mission to provide you with exceptional heart care, we have created designated Provider Care Teams.  These Care Teams include your primary Cardiologist (physician) and Advanced Practice Providers (APPs -  Physician Assistants and Nurse Practitioners) who all work together to provide you with the care you need, when you need it.  We recommend signing up for the patient portal called "MyChart".  Sign up information is provided on this After Visit Summary.  MyChart is used to connect with patients for Virtual Visits (Telemedicine).  Patients are able to view lab/test results, encounter notes, upcoming appointments, etc.  Non-urgent messages can be sent to your provider as well.   To learn more about what you can do with MyChart, go to [email or web address].    Your next appointment:   September 7th with Dr. [name] as previously scheduled   Other Instructions Next Wednesday please send [place] a report of your blood pressure via MyChart. This will let us know if your blood pressure is improving after medication changes.   Make sure you are staying well hydrated and making position changes slowly.

## 2019-09-28 NOTE — Progress Notes (Signed)
Office Visit    Patient Name: [name] Date of Encounter: 09/28/2019  Primary Care Provider:  [name], MD Primary Cardiologist:  Ida Rogue, MD Electrophysiologist:  None   Chief Complaint    [name] is a 49 y.o. female with a hx of CAD s/p prior PCI to LAD and D1 stable coronary anatomy by most recent cath 04/2019, HFrEF secondary to ischemic cardiomyopathy with prior EF 45 to 50% in 2016 with subsequent normalization of LVEF by echo 10/2015, CKD stage III, prior CVA, left carotid to subclavian artery bypass with subclavian artery ligation, aortic arch aneurysm, anemia, obesity s/p gastric bypass, bipolar disorder, DM 2 with polyneuropathy and hyperglycemia, migraine disorder, HTN, HLD, frequent falls/syncope/near syncope presents today for chest pain  Past Medical History    Past Medical History:  Diagnosis Date  . Acute colitis 01/27/2017  . Acute pyelonephritis   . Anemia    iron deficiency anemia  . Aortic arch aneurysm ([place])   . BRCA negative 2014  . CAD (coronary artery disease)    a. 08/2003 Cath: LAD 30-40-med Rx; b. 11/2014 PCI: LAD 31m(3.25x23 Xience Alpine DES); c. 06/2015 PCI: D1 (2.25x12 Resolute Integrity DES); d. 06/2017 PCI: Patent mLAD stent, D2 95 (PTCA); e. 09/2017 PCI: D2 99ost (CBA); d. 12/2017 Cath: LM nl, LAD 372m80d (small), D1 40ost, D2 95ost, LCX 40p, RCA 40ost/p->Med rx for D2 given restenosis.  . Colitis 06/03/2015  . Colon polyp   . CVA (cerebral vascular accident) ([facility]   Left side weakness.   . Degenerative tear of glenoid labrum of right shoulder 03/15/2017  . Diabetes mellitus without complication ([facility]  . Family history of breast cancer    BRCA neg 2014  . Femur fracture, left ([facility]9/10/2018  . Gastric ulcer 04/27/2011  . History of echocardiogram    a. 03/2017 Echo: EF 60-65%, no rwma; b. 02/2018 Echo: EF 60-65%, no rwma. Nl RV fxn. No cardiac source of emboli (admitted w/ stroke).  . Hypertension   .  Malignant melanoma of skin of scalp ([facility]  . MI, acute, non ST segment elevation ([facility]  . Neuromuscular disorder ([facility]  . S/P drug eluting coronary stent placement 06/04/2015  . Sepsis ([facility]2/14/2019  . Stroke ([facility]   a. 02/2018 MRI: 15m115mate acute/early subacute L medial frontal lobe inarct; b. 02/2018 MRA No large vessel occlusion or aneurysm. Mod to sev L P2 stenosis. thready L vertebral artery, diffusely dzs'd; c. 02/2018 Carotid U/S: <50% bilat ICA dzs.   Past Surgical History:  Procedure Laterality Date  . APPENDECTOMY    . CARDIAC CATHETERIZATION N/A 11/09/2014   Procedure: Coronary Angiography;  Surgeon: TimMinna MerrittsD;  [facility];  Service: Cardiovascular;  Laterality: N/A;  . CARDIAC CATHETERIZATION N/A 11/12/2014   Procedure: Coronary Stent Intervention;  Surgeon: AleIsaias CowmanD;  [facility];  Service: Cardiovascular;  Laterality: N/A;  . CARDIAC CATHETERIZATION N/A 04/18/2015   Procedure: Left Heart Cath and Coronary Angiography;  Surgeon: TimMinna MerrittsD;  [facility];  Service: Cardiovascular;  Laterality: N/A;  . CARDIAC CATHETERIZATION Left 06/04/2015   Procedure: Left Heart Cath and Coronary Angiography;  Surgeon: MuhWellington HampshireD;  [facility];  Service: Cardiovascular;  Laterality: Left;  . CARDIAC CATHETERIZATION N/A 06/04/2015   Procedure: Coronary Stent Intervention;  Surgeon: MuhWellington HampshireD;  [facility];  Service: Cardiovascular;  Laterality: N/A;  . CESAREAN SECTION  2001  . CHOLECYSTECTOMY N/A 11/18/2016   Procedure: LAPAROSCOPIC CHOLECYSTECTOMY WITH INTRAOPERATIVE CHOLANGIOGRAM;  Surgeon: Christene Lye, MD;  [facility];  Service: General;  Laterality: N/A;  . COLONOSCOPY WITH PROPOFOL N/A 04/27/2016   Procedure: COLONOSCOPY WITH PROPOFOL;  Surgeon: [name], MD;  Location: [place];  Service: Endoscopy;   Laterality: N/A;  . COLONOSCOPY WITH PROPOFOL N/A 01/12/2018   Procedure: COLONOSCOPY WITH PROPOFOL;  Surgeon: [place], Benay Pike, MD;  Location: [facility];  Service: Endoscopy;  Laterality: N/A;  . CORONARY ANGIOPLASTY    . CORONARY BALLOON ANGIOPLASTY N/A 06/29/2017   Procedure: CORONARY BALLOON ANGIOPLASTY;  Surgeon: Wellington Hampshire, MD;  Location: [place] CV LAB;  Service: Cardiovascular;  Laterality: N/A;  . CORONARY BALLOON ANGIOPLASTY N/A 09/20/2017   Procedure: CORONARY BALLOON ANGIOPLASTY;  Surgeon: Wellington Hampshire, MD;  Location: [place] CV LAB;  Service: Cardiovascular;  Laterality: N/A;  . DILATION AND CURETTAGE OF UTERUS    . ESOPHAGOGASTRODUODENOSCOPY (EGD) WITH PROPOFOL N/A 09/14/2014   Procedure: ESOPHAGOGASTRODUODENOSCOPY (EGD) WITH PROPOFOL;  Surgeon: Josefine Class, MD;  Location[facility] END[facility];  Service: Endoscopy;  Laterality: N/A;  . ESOPHAGOGASTRODUODENOSCOPY (EGD) WITH PROPOFOL N/A 04/27/2016   Procedure: ESOPHAGOGASTRODUODENOSCOPY (EGD) WITH PROPOFOL;  Surgeon: [name], MD;  Location: [place];  Service: Endoscopy;  Laterality: N/A;  Diabetic - oral meds  . ESOPHAGOGASTRODUODENOSCOPY (EGD) WITH PROPOFOL N/A 01/12/2018   Procedure: ESOPHAGOGASTRODUODENOSCOPY (EGD) WITH PROPOFOL;  Surgeon: Tol[place], Benay Pike, MD;  Location: [facility];  Service: Endoscopy;  Laterality: N/A;  . ESOPHAGOGASTRODUODENOSCOPY (EGD) WITH PROPOFOL N/A 04/11/2019   Procedure: ESOPHAGOGASTRODUODENOSCOPY (EGD) WITH PROPOFOL;  Surgeon: [name], MD;  Location: [facility];  Service: Endoscopy;  Laterality: N/A;  . GASTRIC BYPASS  09/2009   [place] (IM) NAIL INTERTROCHANTERIC Left 10/13/2018   Procedure: INTRAMEDULLARY (IM) NAIL INTERTROCHANTRIC;  Surgeon: Leandrew Koyanagi, MD;  Location: [place];  Service: Orthopedics;  Laterality: Left;  . Left Carotid to sublcavian artery bypass w/ subclavian artery ligation     a.  Performed @ [facility].  . LEFT HEART CATH AND CORONARY ANGIOGRAPHY Left 06/29/2017   Procedure: LEFT HEART CATH AND CORONARY ANGIOGRAPHY;  Surgeon: Wellington Hampshire, MD;  Location: [place] [facility] LAB;  Service: Cardiovascular;  Laterality: Left;  . LEFT HEART CATH AND CORONARY ANGIOGRAPHY N/A 09/20/2017   Procedure: LEFT HEART CATH AND CORONARY ANGIOGRAPHY;  Surgeon: Wellington Hampshire, MD;  Location: [place] CV LAB;  Service: Cardiovascular;  Laterality: N/A;  . LEFT HEART CATH AND CORONARY ANGIOGRAPHY N/A 12/20/2017   Procedure: LEFT HEART CATH AND CORONARY ANGIOGRAPHY;  Surgeon: Wellington Hampshire, MD;  Location: [place] CV LAB;  Service: Cardiovascular;  Laterality: N/A;  . LEFT HEART CATH AND CORONARY ANGIOGRAPHY N/A 04/20/2019   Procedure: LEFT HEART CATH AND CORONARY ANGIOGRAPHY possible PCI;  Surgeon: Minna Merritts, MD;  Location: [place] CV LAB;  Service: Cardiovascular;  Laterality: N/A;  . MELANOMA EXCISION  2016   Dr. Evorn Gong  . [place]  2002  . RIGHT OOPHORECTOMY    . SHOULDER ARTHROSCOPY WITH OPEN ROTATOR CUFF REPAIR Right 01/07/2016   Procedure: SHOULDER ARTHROSCOPY WITH DEBRIDMENT, SUBACHROMIAL DECOMPRESSION;  Surgeon: Corky Mull, MD;  Location: [facility];  Service: Orthopedics;  Laterality: Right;  . SHOULDER ARTHROSCOPY WITH OPEN ROTATOR CUFF REPAIR Right 03/16/2017   Procedure: SHOULDER ARTHROSCOPY WITH OPEN ROTATOR CUFF  REPAIR POSSIBLE BICEPS TENODESIS;  Surgeon: Corky Mull, MD;  Location: [facility];  Service: Orthopedics;  Laterality: Right;  . TRIGGER FINGER RELEASE Right     Middle Finger    Allergies  Allergies  Allergen Reactions  . Lipitor [Atorvastatin] Other (See Comments)    Leg pains  . Tramadol Other (See Comments)    Mouth feels like it's on fire    History of Present Illness    [place] Marita [place] Tobin [name] is a 49 y.o. female with a hx of CAD s/p prior PCI to LAD and D1 stable coronary anatomy by most recent cath 04/2019, HFrEF  secondary to ischemic cardiomyopathy with prior EF 45 to 50% in 2016 with subsequent normalization of LVEF by echo 10/2015, CKD stage III, prior CVA, left carotid to subclavian artery bypass with subclavian artery ligation, aortic arch aneurysm, anemia, obesity s/p gastric bypass, bipolar disorder, DM 2 with polyneuropathy and hyperglycemia, migraine disorder, HTN, HLD, frequent falls/syncope/near syncope.  She was last seen while hospitalized.  Diagnosis CAD in July 2005 with LHC at that time showed moderate LAD disease that was medically managed.  Seen October 2016 with angina and underwent LHC with PCI/DES to mid LAD.  Escalation of antianginal therapy has been limited by hypotension or adverse effects.  May 2017 had repeat LHC with pain LAD stent with element of significant D1 disease treated with PCI/DES.  Recurrent angina 06/2017 LHC showed severe stenosis in D2 s/p PTCA.  Recurrent angina July 2019 with repeat LHC with restenosis of D2 treated with [place] angioplasty.  LHC 12/2017 with 95% ostial stenosis/restenosis of D2 and significant apical LAD disease.  Medical therapy was felt to be warranted.  Admitted January 2020 for CVA and MRI showing late acute/early subacute left medial frontal lobe infarct.  Echo at that time with normal LVEF without cardiac source of emboli.  Admitted 04/2019 with chest pain in the setting of hypertensive urgency.  Cardiac enzymes negative.  LHC 04/20/2019 with no significant change in coronary anatomy dating back to cardiac cath in 2019 severe native vessel CAD, patent stents in LAD and diagonal, stable severe ostial/proximal D2 disease is without changes when compared to prior not amenable to stenting.  Distal LAD was small and tapered to severe disease which was not amenable to stenting.  Medical management was recommended.  Seen in the ED 09/01/2019 with hyperglycemia, dysphagia, intermittent chest pain with high-sensitivity troponin negative x3 and EKG nonacute.  She  returned to the ED the next day with continued hypoglycemic episodes, MRI abdomen unrevealing, she noted chest pressure with EKG nonacute high-sensitivity troponin negative x2.  No cardiac intervention recommended at that time.  Presents today for chest pain and hypertension. BP at home has been systolic 30-94. She has been taking a half tablet of Amlodipine for a dose of 65m. She takes her Coreg twice per day about 7am-8am and then at bedtime which is about 12am.  We discussed need to take doses approximately 12 hours apart.  Reports a lightheaded and near syncope. Has not passed out. She has fallen, but reports no significant injury. Heart rate at home has been in the 70s at home.   Tells me she is having chest pain all the time. She was short of breath with walking in to the clinic today. Tells me it starts like a "pain" and then feels like a pressure. Tells me she does not take nitroglycerin until it feels like an "elephant sitting in her chest". She did take  a nitroglycerin the few days ago.  She is been told previously that repeat LHC and consideration for complex PCI of LAD/D1 bifurcation would be high risk but she is very interested in pursuing.  EKGs/Labs/Other Studies Reviewed:   The following studies were reviewed today: LHC 05/06/2019: Coronary dominance: Right  Left mainstem:   Large vessel that bifurcates into the LAD and left circumflex, no significant disease noted  Left anterior descending (LAD):   Large vessel that extends to the apical region, diagonal branch #1 of moderate size with patent stent , diagonal #2 small to moderate in size with severe ostial/proximal disease, severe distal disease estimated 80%  Left circumflex (LCx):  Large vessel with OM branch 2, mild to moderate mid vessel disease  Right coronary artery (RCA):  Right dominant vessel with PL and PDA, no significant disease noted , very mild mid vessel disease  Left ventriculography: Left ventricular systolic  function is normal, LVEF is estimated at 55-65%, there is no significant mitral regurgitation , no significant aortic valve stenosis  Final Conclusions:   In comparison to images from catheterization 2019 no significant change Severe native coronary disease, Patent stents LAD and diagonal vessel Stable severe ostial/proximal diagonal #2 disease, no change from previous catheterization 2019, not amenable to stenting Distal LAD is small and tapers, severe disease, not amenable to stenting  Recommendations:  Continue aggressive medical management, LDL less than 70, aggressive diabetes control, antianginals Of note left ventricular end-diastolic pressure was low consistent with dehydration, would continue IV fluids __________   2D echo 01/2019: 1. Left ventricular ejection fraction, by visual estimation, is 60 to  65%. The left ventricle has normal function. There is no left ventricular  hypertrophy.   2. Left ventricular diastolic parameters are consistent with Grade I  diastolic dysfunction (impaired relaxation).   3. The left ventricle has no regional wall motion abnormalities.   4. Global right ventricle has normal systolic function.The right  ventricular size is normal. No increase in right ventricular wall  thickness.   5. Left atrial size was normal.   6. TR signal is inadequate for assessing pulmonary artery systolic  pressure.    EKG:  EKG is  ordered today.  The ekg ordered today demonstrates normal sinus rhythm 81 bpm with no acute ST/T wave changes.  Recent Labs: 02/07/2019: Magnesium 1.8 04/18/2019: B Natriuretic Peptide 114.0 09/03/2019: ALT 61 09/04/2019: TSH 3.621 09/08/2019: Hemoglobin 11.7; Platelets 170 09/09/2019: BUN 13; Creatinine, Ser 1.18; Potassium 4.2; Sodium 134  Recent Lipid Panel    Component Value Date/Time   CHOL 125 04/19/2019 0408   CHOL 159 05/28/2016 1212   CHOL 197 12/30/2013 0355   TRIG 75 04/19/2019 0408   TRIG 261 (H) 12/30/2013 0355   HDL 34 (L)  04/19/2019 0408   HDL 34 (L) 05/28/2016 1212   HDL 28 (L) 12/30/2013 0355   CHOLHDL 3.7 04/19/2019 0408   VLDL 15 04/19/2019 0408   VLDL 52 (H) 12/30/2013 0355   LDLCALC 76 04/19/2019 0408   LDLCALC 103 (H) 12/08/2016 1221   LDLCALC 117 (H) 12/30/2013 0355   LDLDIRECT 83.3 01/26/2011 0913    Home Medications   Current Meds  Medication Sig  . acarbose (PRECOSE) 25 MG tablet Take 1 tablet by mouth 3 (three) times daily before meals.  . ALPRAZolam (XANAX) 1 MG tablet Take 1 tablet by mouth twice daily as needed (Patient taking differently: Take 1 mg by mouth 2 (two) times daily as needed for anxiety. )  .  amLODipine (NORVASC) 10 MG tablet Take 0.5 tablets (5 mg total) by mouth daily.  Marland Kitchen aspirin 81 MG EC tablet Take 81 mg by mouth daily.   Marland Kitchen buPROPion (WELLBUTRIN XL) 300 MG 24 hr tablet Take 1 tablet (300 mg total) by mouth daily.  . carvedilol (COREG) 6.25 MG tablet Take 1 tablet (6.25 mg total) by mouth 2 (two) times daily with a meal.  . Cholecalciferol 50 MCG (2000 UT) TABS Take 2,000 Units by mouth daily.  . clopidogrel (PLAVIX) 75 MG tablet Take 1 tablet (75 mg total) by mouth daily.  . diphenoxylate-atropine (LOMOTIL) 2.5-0.025 MG tablet TAKE 1 TABLET BY MOUTH ONCE TO 4 TIMES DAILY FOR DIARRHEA  . EUTHYROX 25 MCG tablet TAKE 1 TABLET BY MOUTH ONCE DAILY BEFORE BREAKFAST  . gabapentin (NEURONTIN) 300 MG capsule Take 1 capsule (300 mg total) by mouth 2 (two) times daily.  . isosorbide mononitrate (IMDUR) 30 MG 24 hr tablet Take 3 tablets (90 mg total) by mouth daily.  . Multiple Vitamin (MULTI-VITAMIN) tablet Take 1 tablet by mouth daily.   . nitroGLYCERIN (NITROSTAT) 0.4 MG SL tablet Place 1 tablet (0.4 mg total) under the tongue every 5 (five) minutes as needed for chest pain.  . pantoprazole (PROTONIX) 40 MG tablet Take 1 tablet by mouth twice daily (Patient taking differently: Take 40 mg by mouth 2 (two) times daily. )  . promethazine (PHENERGAN) 25 MG tablet Take 25 mg by mouth  every 6 (six) hours as needed for nausea or vomiting.   . rosuvastatin (CRESTOR) 5 MG tablet Take 1 tablet (5 mg total) by mouth daily at 6 PM.  . traZODone (DESYREL) 100 MG tablet Take 100-200 mg by mouth at bedtime as needed for sleep.   Marland Kitchen venlafaxine XR (EFFEXOR-XR) 75 MG 24 hr capsule Take 2 capsules (150 mg total) by mouth daily with breakfast.      Review of Systems    Review of Systems  Constitutional: Negative for chills, fever and malaise/fatigue.  Cardiovascular: Positive for chest pain, dyspnea on exertion and near-syncope. Negative for leg swelling, orthopnea, palpitations and syncope.  Respiratory: Negative for cough, shortness of breath and wheezing.   Gastrointestinal: Negative for nausea and vomiting.  Neurological: Positive for dizziness, light-headedness and weakness.   All other systems reviewed and are otherwise negative except as noted above.  Physical Exam    VS:  BP 118/80 (BP Location: Left Arm, Patient Position: Sitting, Cuff Size: Normal)   Pulse 81   Ht 5' 2" (1.575 m)   Wt 151 lb (68.5 kg)   BMI 27.62 kg/m  , BMI Body mass index is 27.62 kg/m. GEN: Well nourished, well developed, in no acute distress. HEENT: normal. Neck: Supple, no JVD, carotid bruits, or masses. Cardiac: RRR, no murmurs, rubs, or gallops. No clubbing, cyanosis, edema.  Radials/DP/PT 2+ and equal bilaterally.  Respiratory:  Respirations regular and unlabored, clear to auscultation bilaterally. GI: Soft, nontender, nondistended, BS + x 4. MS: No deformity or atrophy. Skin: Warm and dry, no rash. Neuro:  Strength and sensation are intact. Psych: Normal affect. Anxious.  Assessment & Plan    1. CAD involving native coronary arteries with chronic angina -most recent [facility] 04/2019 with patent stent to LAD and diagonal with stable severe ostial to proximal D2 disease unchanged from cath in 2019 unamenable to stenting.  Distal LAD was small and tapered to severe disease which was also not  amenable to stenting.  Recommended for medical management.  She has continued  intermittent chronic angina.  EKG today nonacute.  Imdur increased during recent hospitalization to 90 mg daily without change in symptoms.  She was previously on Ranexa with no improvement in symptoms.  We are discontinuing her amlodipine today due to low blood pressure and hopes to potentially further escalate her Imdur.  If she continues to have angina she may need repeat LHC with consideration for complex PCI of the LAD/D1 bifurcation.  However IVUS procedure stenosis involves small diagonal branch with increased risk for need to stent LAD following intervention on the D1 is LAD demonstrates no significant stenoses at that location.  She will discuss with Dr. [name] at follow-up.  2. HFrEF secondary to ICM with subsequent normalization of LVEF -euvolemic and well compensated on exam.  No ARB secondary to hypotension.  Continue Coreg, Imdur.  Low-sodium, heart healthy diet encouraged.  3. CKD 3 -careful titration of diuretics and antihypertensives.  4. HTN/hypotension -systolic blood pressure at home 80-90 with associated near syncope. Discontinue amlodipine.  If BP does not normalize may consider reduction in dose of Coreg.  Of note she is on other medications including Xanax which could also be contributory to low blood pressure.  5. HLD -04/2019 LDL of 76.  Continue rosuvastatin 5 mg daily.  Disposition: Follow up in 3 week(s) with Dr. [name] as previously scheduled  Loel Dubonnet, NP 09/28/2019, 3:16 PM

## 2019-09-30 DIAGNOSIS — E1122 Type 2 diabetes mellitus with diabetic chronic kidney disease: Secondary | ICD-10-CM | POA: Diagnosis not present

## 2019-09-30 DIAGNOSIS — I2510 Atherosclerotic heart disease of native coronary artery without angina pectoris: Secondary | ICD-10-CM | POA: Diagnosis not present

## 2019-09-30 DIAGNOSIS — E11649 Type 2 diabetes mellitus with hypoglycemia without coma: Secondary | ICD-10-CM | POA: Diagnosis not present

## 2019-09-30 DIAGNOSIS — I252 Old myocardial infarction: Secondary | ICD-10-CM | POA: Diagnosis not present

## 2019-09-30 DIAGNOSIS — E1142 Type 2 diabetes mellitus with diabetic polyneuropathy: Secondary | ICD-10-CM | POA: Diagnosis not present

## 2019-09-30 DIAGNOSIS — I129 Hypertensive chronic kidney disease with stage 1 through stage 4 chronic kidney disease, or unspecified chronic kidney disease: Secondary | ICD-10-CM | POA: Diagnosis not present

## 2019-10-03 ENCOUNTER — Encounter: Payer: Self-pay | Admitting: Family Medicine

## 2019-10-04 ENCOUNTER — Encounter: Payer: Self-pay | Admitting: Family Medicine

## 2019-10-05 ENCOUNTER — Telehealth: Payer: Self-pay | Admitting: Cardiovascular Disease

## 2019-10-05 DIAGNOSIS — E1142 Type 2 diabetes mellitus with diabetic polyneuropathy: Secondary | ICD-10-CM | POA: Diagnosis not present

## 2019-10-05 DIAGNOSIS — E1122 Type 2 diabetes mellitus with diabetic chronic kidney disease: Secondary | ICD-10-CM | POA: Diagnosis not present

## 2019-10-05 DIAGNOSIS — E11649 Type 2 diabetes mellitus with hypoglycemia without coma: Secondary | ICD-10-CM | POA: Diagnosis not present

## 2019-10-05 DIAGNOSIS — I2510 Atherosclerotic heart disease of native coronary artery without angina pectoris: Secondary | ICD-10-CM | POA: Diagnosis not present

## 2019-10-05 DIAGNOSIS — I252 Old myocardial infarction: Secondary | ICD-10-CM | POA: Diagnosis not present

## 2019-10-05 DIAGNOSIS — I129 Hypertensive chronic kidney disease with stage 1 through stage 4 chronic kidney disease, or unspecified chronic kidney disease: Secondary | ICD-10-CM | POA: Diagnosis not present

## 2019-10-05 NOTE — Telephone Encounter (Signed)
  Patient Consent for Virtual Visit         [place] has provided verbal consent on 10/05/2019 for a virtual visit (video or telephone).   CONSENT FOR VIRTUAL VISIT FOR:  [name]  By participating in this virtual visit I agree to the following:  I hereby voluntarily request, consent and authorize [facility] and its employed or contracted physicians, physician assistants, nurse practitioners or other licensed health care professionals (the Practitioner), to provide me with telemedicine health care services (the "Services") as deemed necessary by the treating Practitioner. I acknowledge and consent to receive the Services by the Practitioner via telemedicine. I understand that the telemedicine visit will involve communicating with the Practitioner through live audiovisual communication technology and the disclosure of certain medical information by electronic transmission. I acknowledge that I have been given the opportunity to request an in-person assessment or other available alternative prior to the telemedicine visit and am voluntarily participating in the telemedicine visit.  I understand that I have the right to withhold or withdraw my consent to the use of telemedicine in the course of my care at any time, without affecting my right to future care or treatment, and that the Practitioner or I may terminate the telemedicine visit at any time. I understand that I have the right to inspect all information obtained and/or recorded in the course of the telemedicine visit and may receive copies of available information for a reasonable fee.  I understand that some of the potential risks of receiving the Services via telemedicine include:  Marland Kitchen Delay or interruption in medical evaluation due to technological equipment failure or disruption; . Information transmitted may not be sufficient (e.g. poor resolution of images) to allow for appropriate medical decision making by the  Practitioner; and/or  . In rare instances, security protocols could fail, causing a breach of personal health information.  Furthermore, I acknowledge that it is my responsibility to provide information about my medical history, conditions and care that is complete and accurate to the best of my ability. I acknowledge that Practitioner's advice, recommendations, and/or decision may be based on factors not within their control, such as incomplete or inaccurate data provided by me or distortions of diagnostic images or specimens that may result from electronic transmissions. I understand that the practice of medicine is not an exact science and that Practitioner makes no warranties or guarantees regarding treatment outcomes. I acknowledge that a copy of this consent can be made available to me via my patient portal ([facility]), or I can request a printed copy by calling the office of [facility].    I understand that my insurance will be billed for this visit.   I have read or had this consent read to me. . I understand the contents of this consent, which adequately explains the benefits and risks of the Services being provided via telemedicine.  . I have been provided ample opportunity to ask questions regarding this consent and the Services and have had my questions answered to my satisfaction. . I give my informed consent for the services to be provided through the use of telemedicine in my medical care

## 2019-10-10 ENCOUNTER — Other Ambulatory Visit: Payer: Self-pay

## 2019-10-10 ENCOUNTER — Ambulatory Visit: Payer: Medicare Other | Admitting: Cardiovascular Disease

## 2019-10-10 MED ORDER — CLOPIDOGREL BISULFATE 75 MG PO TABS: 75.0000 mg | ORAL_TABLET | Freq: Every day | ORAL | 0 refills | Status: AC

## 2019-10-10 MED ORDER — ROSUVASTATIN CALCIUM 5 MG PO TABS: 5.0000 mg | ORAL_TABLET | Freq: Every day | ORAL | 0 refills | Status: DC

## 2019-10-11 ENCOUNTER — Other Ambulatory Visit: Payer: Self-pay

## 2019-10-11 ENCOUNTER — Telehealth: Payer: Medicare Other | Admitting: Cardiovascular Disease

## 2019-10-11 DIAGNOSIS — E1122 Type 2 diabetes mellitus with diabetic chronic kidney disease: Secondary | ICD-10-CM | POA: Diagnosis not present

## 2019-10-11 DIAGNOSIS — E1142 Type 2 diabetes mellitus with diabetic polyneuropathy: Secondary | ICD-10-CM | POA: Diagnosis not present

## 2019-10-11 DIAGNOSIS — I252 Old myocardial infarction: Secondary | ICD-10-CM | POA: Diagnosis not present

## 2019-10-11 DIAGNOSIS — E11649 Type 2 diabetes mellitus with hypoglycemia without coma: Secondary | ICD-10-CM | POA: Diagnosis not present

## 2019-10-11 DIAGNOSIS — I2510 Atherosclerotic heart disease of native coronary artery without angina pectoris: Secondary | ICD-10-CM | POA: Diagnosis not present

## 2019-10-11 DIAGNOSIS — I129 Hypertensive chronic kidney disease with stage 1 through stage 4 chronic kidney disease, or unspecified chronic kidney disease: Secondary | ICD-10-CM | POA: Diagnosis not present

## 2019-10-11 NOTE — Progress Notes (Unsigned)
No show

## 2019-10-12 DIAGNOSIS — E11649 Type 2 diabetes mellitus with hypoglycemia without coma: Secondary | ICD-10-CM | POA: Diagnosis not present

## 2019-10-12 DIAGNOSIS — I252 Old myocardial infarction: Secondary | ICD-10-CM | POA: Diagnosis not present

## 2019-10-12 DIAGNOSIS — I2510 Atherosclerotic heart disease of native coronary artery without angina pectoris: Secondary | ICD-10-CM | POA: Diagnosis not present

## 2019-10-12 DIAGNOSIS — E1142 Type 2 diabetes mellitus with diabetic polyneuropathy: Secondary | ICD-10-CM | POA: Diagnosis not present

## 2019-10-12 DIAGNOSIS — E1122 Type 2 diabetes mellitus with diabetic chronic kidney disease: Secondary | ICD-10-CM | POA: Diagnosis not present

## 2019-10-12 DIAGNOSIS — I129 Hypertensive chronic kidney disease with stage 1 through stage 4 chronic kidney disease, or unspecified chronic kidney disease: Secondary | ICD-10-CM | POA: Diagnosis not present

## 2019-10-13 ENCOUNTER — Other Ambulatory Visit: Payer: Self-pay

## 2019-10-13 ENCOUNTER — Encounter: Payer: Self-pay | Admitting: Emergency Medicine

## 2019-10-13 ENCOUNTER — Inpatient Hospital Stay
Admission: EM | Admit: 2019-10-13 | Discharge: 2019-10-20 | DRG: 644 | Disposition: A | Discharge status: Home / Self Care | Payer: Medicare Other | Attending: Internal Medicine | Admitting: Internal Medicine

## 2019-10-13 DIAGNOSIS — I25118 Atherosclerotic heart disease of native coronary artery with other forms of angina pectoris: Secondary | ICD-10-CM | POA: Diagnosis not present

## 2019-10-13 DIAGNOSIS — R079 Chest pain, unspecified: Secondary | ICD-10-CM

## 2019-10-13 DIAGNOSIS — Z885 Allergy status to narcotic agent status: Secondary | ICD-10-CM | POA: Diagnosis not present

## 2019-10-13 DIAGNOSIS — Z83438 Family history of other disorder of lipoprotein metabolism and other lipidemia: Secondary | ICD-10-CM

## 2019-10-13 DIAGNOSIS — G709 Myoneural disorder, unspecified: Secondary | ICD-10-CM | POA: Diagnosis not present

## 2019-10-13 DIAGNOSIS — Z9049 Acquired absence of other specified parts of digestive tract: Secondary | ICD-10-CM | POA: Diagnosis not present

## 2019-10-13 DIAGNOSIS — Z841 Family history of disorders of kidney and ureter: Secondary | ICD-10-CM

## 2019-10-13 DIAGNOSIS — Z803 Family history of malignant neoplasm of breast: Secondary | ICD-10-CM

## 2019-10-13 DIAGNOSIS — D509 Iron deficiency anemia, unspecified: Secondary | ICD-10-CM | POA: Diagnosis not present

## 2019-10-13 DIAGNOSIS — E162 Hypoglycemia, unspecified: Secondary | ICD-10-CM | POA: Diagnosis not present

## 2019-10-13 DIAGNOSIS — I712 Thoracic aortic aneurysm, without rupture: Secondary | ICD-10-CM | POA: Diagnosis not present

## 2019-10-13 DIAGNOSIS — E161 Other hypoglycemia: Secondary | ICD-10-CM | POA: Diagnosis present

## 2019-10-13 DIAGNOSIS — R509 Fever, unspecified: Secondary | ICD-10-CM | POA: Diagnosis not present

## 2019-10-13 DIAGNOSIS — Z8041 Family history of malignant neoplasm of ovary: Secondary | ICD-10-CM

## 2019-10-13 DIAGNOSIS — Z818 Family history of other mental and behavioral disorders: Secondary | ICD-10-CM

## 2019-10-13 DIAGNOSIS — I2510 Atherosclerotic heart disease of native coronary artery without angina pectoris: Secondary | ICD-10-CM | POA: Diagnosis not present

## 2019-10-13 DIAGNOSIS — I10 Essential (primary) hypertension: Secondary | ICD-10-CM | POA: Diagnosis not present

## 2019-10-13 DIAGNOSIS — Z888 Allergy status to other drugs, medicaments and biological substances status: Secondary | ICD-10-CM

## 2019-10-13 DIAGNOSIS — Z86010 Personal history of colonic polyps: Secondary | ICD-10-CM

## 2019-10-13 DIAGNOSIS — K529 Noninfective gastroenteritis and colitis, unspecified: Secondary | ICD-10-CM | POA: Diagnosis not present

## 2019-10-13 DIAGNOSIS — D631 Anemia in chronic kidney disease: Secondary | ICD-10-CM | POA: Diagnosis not present

## 2019-10-13 DIAGNOSIS — Z7982 Long term (current) use of aspirin: Secondary | ICD-10-CM

## 2019-10-13 DIAGNOSIS — Z95828 Presence of other vascular implants and grafts: Secondary | ICD-10-CM | POA: Diagnosis not present

## 2019-10-13 DIAGNOSIS — E1122 Type 2 diabetes mellitus with diabetic chronic kidney disease: Secondary | ICD-10-CM | POA: Diagnosis present

## 2019-10-13 DIAGNOSIS — I5042 Chronic combined systolic (congestive) and diastolic (congestive) heart failure: Secondary | ICD-10-CM | POA: Diagnosis not present

## 2019-10-13 DIAGNOSIS — Z955 Presence of coronary angioplasty implant and graft: Secondary | ICD-10-CM

## 2019-10-13 DIAGNOSIS — K219 Gastro-esophageal reflux disease without esophagitis: Secondary | ICD-10-CM | POA: Diagnosis present

## 2019-10-13 DIAGNOSIS — I5022 Chronic systolic (congestive) heart failure: Secondary | ICD-10-CM | POA: Diagnosis present

## 2019-10-13 DIAGNOSIS — E785 Hyperlipidemia, unspecified: Secondary | ICD-10-CM | POA: Diagnosis present

## 2019-10-13 DIAGNOSIS — Z8249 Family history of ischemic heart disease and other diseases of the circulatory system: Secondary | ICD-10-CM | POA: Diagnosis not present

## 2019-10-13 DIAGNOSIS — Z8051 Family history of malignant neoplasm of kidney: Secondary | ICD-10-CM

## 2019-10-13 DIAGNOSIS — N1831 Chronic kidney disease, stage 3a: Secondary | ICD-10-CM | POA: Diagnosis not present

## 2019-10-13 DIAGNOSIS — Z87440 Personal history of urinary (tract) infections: Secondary | ICD-10-CM | POA: Diagnosis not present

## 2019-10-13 DIAGNOSIS — F319 Bipolar disorder, unspecified: Secondary | ICD-10-CM | POA: Diagnosis not present

## 2019-10-13 DIAGNOSIS — I959 Hypotension, unspecified: Secondary | ICD-10-CM | POA: Diagnosis present

## 2019-10-13 DIAGNOSIS — I252 Old myocardial infarction: Secondary | ICD-10-CM | POA: Diagnosis not present

## 2019-10-13 DIAGNOSIS — E1142 Type 2 diabetes mellitus with diabetic polyneuropathy: Secondary | ICD-10-CM | POA: Diagnosis not present

## 2019-10-13 DIAGNOSIS — I129 Hypertensive chronic kidney disease with stage 1 through stage 4 chronic kidney disease, or unspecified chronic kidney disease: Secondary | ICD-10-CM | POA: Diagnosis not present

## 2019-10-13 DIAGNOSIS — Z8673 Personal history of transient ischemic attack (TIA), and cerebral infarction without residual deficits: Secondary | ICD-10-CM | POA: Diagnosis not present

## 2019-10-13 DIAGNOSIS — E118 Type 2 diabetes mellitus with unspecified complications: Secondary | ICD-10-CM | POA: Diagnosis not present

## 2019-10-13 DIAGNOSIS — I25119 Atherosclerotic heart disease of native coronary artery with unspecified angina pectoris: Secondary | ICD-10-CM | POA: Diagnosis present

## 2019-10-13 DIAGNOSIS — K259 Gastric ulcer, unspecified as acute or chronic, without hemorrhage or perforation: Secondary | ICD-10-CM | POA: Diagnosis not present

## 2019-10-13 DIAGNOSIS — R296 Repeated falls: Secondary | ICD-10-CM | POA: Diagnosis not present

## 2019-10-13 DIAGNOSIS — I130 Hypertensive heart and chronic kidney disease with heart failure and stage 1 through stage 4 chronic kidney disease, or unspecified chronic kidney disease: Secondary | ICD-10-CM | POA: Diagnosis present

## 2019-10-13 DIAGNOSIS — Z85820 Personal history of malignant melanoma of skin: Secondary | ICD-10-CM

## 2019-10-13 DIAGNOSIS — D126 Benign neoplasm of colon, unspecified: Secondary | ICD-10-CM | POA: Diagnosis not present

## 2019-10-13 DIAGNOSIS — Z79899 Other long term (current) drug therapy: Secondary | ICD-10-CM

## 2019-10-13 DIAGNOSIS — Z823 Family history of stroke: Secondary | ICD-10-CM

## 2019-10-13 DIAGNOSIS — Z87891 Personal history of nicotine dependence: Secondary | ICD-10-CM | POA: Diagnosis not present

## 2019-10-13 DIAGNOSIS — Z85828 Personal history of other malignant neoplasm of skin: Secondary | ICD-10-CM | POA: Diagnosis not present

## 2019-10-13 DIAGNOSIS — E11649 Type 2 diabetes mellitus with hypoglycemia without coma: Secondary | ICD-10-CM | POA: Diagnosis not present

## 2019-10-13 DIAGNOSIS — Z833 Family history of diabetes mellitus: Secondary | ICD-10-CM

## 2019-10-13 DIAGNOSIS — R55 Syncope and collapse: Secondary | ICD-10-CM | POA: Diagnosis not present

## 2019-10-13 DIAGNOSIS — F418 Other specified anxiety disorders: Secondary | ICD-10-CM | POA: Diagnosis not present

## 2019-10-13 DIAGNOSIS — Z9884 Bariatric surgery status: Secondary | ICD-10-CM | POA: Diagnosis not present

## 2019-10-13 DIAGNOSIS — E871 Hypo-osmolality and hyponatremia: Secondary | ICD-10-CM | POA: Diagnosis not present

## 2019-10-13 DIAGNOSIS — N179 Acute kidney failure, unspecified: Secondary | ICD-10-CM | POA: Diagnosis present

## 2019-10-13 DIAGNOSIS — E039 Hypothyroidism, unspecified: Secondary | ICD-10-CM | POA: Diagnosis present

## 2019-10-13 DIAGNOSIS — S43431D Superior glenoid labrum lesion of right shoulder, subsequent encounter: Secondary | ICD-10-CM | POA: Diagnosis not present

## 2019-10-13 DIAGNOSIS — Z808 Family history of malignant neoplasm of other organs or systems: Secondary | ICD-10-CM

## 2019-10-13 DIAGNOSIS — E781 Pure hyperglyceridemia: Secondary | ICD-10-CM | POA: Diagnosis not present

## 2019-10-13 DIAGNOSIS — I255 Ischemic cardiomyopathy: Secondary | ICD-10-CM | POA: Diagnosis present

## 2019-10-13 DIAGNOSIS — Z8781 Personal history of (healed) traumatic fracture: Secondary | ICD-10-CM | POA: Diagnosis not present

## 2019-10-13 DIAGNOSIS — Z7902 Long term (current) use of antithrombotics/antiplatelets: Secondary | ICD-10-CM

## 2019-10-13 DIAGNOSIS — I69354 Hemiplegia and hemiparesis following cerebral infarction affecting left non-dominant side: Secondary | ICD-10-CM | POA: Diagnosis not present

## 2019-10-13 DIAGNOSIS — Z800 Family history of malignant neoplasm of digestive organs: Secondary | ICD-10-CM

## 2019-10-13 DIAGNOSIS — Z20822 Contact with and (suspected) exposure to covid-19: Secondary | ICD-10-CM | POA: Diagnosis present

## 2019-10-13 DIAGNOSIS — I2699 Other pulmonary embolism without acute cor pulmonale: Secondary | ICD-10-CM | POA: Diagnosis not present

## 2019-10-13 DIAGNOSIS — F0280 Dementia in other diseases classified elsewhere without behavioral disturbance: Secondary | ICD-10-CM | POA: Diagnosis not present

## 2019-10-13 DIAGNOSIS — R1310 Dysphagia, unspecified: Secondary | ICD-10-CM | POA: Diagnosis not present

## 2019-10-13 DIAGNOSIS — J90 Pleural effusion, not elsewhere classified: Secondary | ICD-10-CM | POA: Diagnosis not present

## 2019-10-13 LAB — COMPREHENSIVE METABOLIC PANEL
ALT: 28 U/L (ref 0–44)
AST: 28 U/L (ref 15–41)
Albumin: 3.8 g/dL (ref 3.5–5.0)
Alkaline Phosphatase: 93 U/L (ref 38–126)
Anion gap: 9 (ref 5–15)
BUN: 22 mg/dL — ABNORMAL HIGH (ref 6–20)
CO2: 23 mmol/L (ref 22–32)
Calcium: 8.8 mg/dL — ABNORMAL LOW (ref 8.9–10.3)
Chloride: 106 mmol/L (ref 98–111)
Creatinine, Ser: 1.76 mg/dL — ABNORMAL HIGH (ref 0.44–1.00)
GFR calc Af Amer: 38 mL/min — ABNORMAL LOW (ref >60)
GFR calc non Af Amer: 33 mL/min — ABNORMAL LOW (ref >60)
Glucose, Bld: 153 mg/dL — ABNORMAL HIGH (ref 70–99)
Potassium: 4.8 mmol/L (ref 3.5–5.1)
Sodium: 138 mmol/L (ref 135–145)
Total Bilirubin: 0.7 mg/dL (ref 0.3–1.2)
Total Protein: 7.4 g/dL (ref 6.5–8.1)

## 2019-10-13 LAB — CBC WITH DIFFERENTIAL/PLATELET
Abs Immature Granulocytes: 0.03 10*3/uL (ref 0.00–0.07)
Basophils Absolute: 0.1 10*3/uL (ref 0.0–0.1)
Basophils Relative: 1 %
Eosinophils Absolute: 0.2 10*3/uL (ref 0.0–0.5)
Eosinophils Relative: 2 %
HCT: 38.7 % (ref 36.0–46.0)
Hemoglobin: 13 g/dL (ref 12.0–15.0)
Immature Granulocytes: 0 %
Lymphocytes Relative: 15 %
Lymphs Abs: 1.4 10*3/uL (ref 0.7–4.0)
MCH: 27.7 pg (ref 26.0–34.0)
MCHC: 33.6 g/dL (ref 30.0–36.0)
MCV: 82.5 fL (ref 80.0–100.0)
Monocytes Absolute: 0.4 10*3/uL (ref 0.1–1.0)
Monocytes Relative: 4 %
Neutro Abs: 7.2 10*3/uL (ref 1.7–7.7)
Neutrophils Relative %: 78 %
Platelets: 202 10*3/uL (ref 150–400)
RBC: 4.69 MIL/uL (ref 3.87–5.11)
RDW: 14 % (ref 11.5–15.5)
WBC: 9.2 10*3/uL (ref 4.0–10.5)
nRBC: 0 % (ref 0.0–0.2)

## 2019-10-13 LAB — GLUCOSE, CAPILLARY
Glucose-Capillary: 102 mg/dL — ABNORMAL HIGH (ref 70–99)
Glucose-Capillary: 156 mg/dL — ABNORMAL HIGH (ref 70–99)
Glucose-Capillary: 157 mg/dL — ABNORMAL HIGH (ref 70–99)
Glucose-Capillary: 50 mg/dL — ABNORMAL LOW (ref 70–99)
Glucose-Capillary: 59 mg/dL — ABNORMAL LOW (ref 70–99)
Glucose-Capillary: 68 mg/dL — ABNORMAL LOW (ref 70–99)
Glucose-Capillary: 76 mg/dL (ref 70–99)
Glucose-Capillary: 86 mg/dL (ref 70–99)
Glucose-Capillary: 97 mg/dL (ref 70–99)

## 2019-10-13 LAB — URINALYSIS, COMPLETE (UACMP) WITH MICROSCOPIC
Bilirubin Urine: NEGATIVE
Glucose, UA: NEGATIVE mg/dL
Hgb urine dipstick: NEGATIVE
Ketones, ur: NEGATIVE mg/dL
Nitrite: NEGATIVE
Protein, ur: NEGATIVE mg/dL
Specific Gravity, Urine: 1.008 (ref 1.005–1.030)
WBC, UA: >50 WBC/hpf — ABNORMAL HIGH (ref 0–5)
pH: 6 (ref 5.0–8.0)

## 2019-10-13 LAB — SARS CORONAVIRUS 2 BY RT PCR (HOSPITAL ORDER, PERFORMED IN ~~LOC~~ HOSPITAL LAB): SARS Coronavirus 2: NEGATIVE

## 2019-10-13 IMAGING — CR DG HIP (WITH OR WITHOUT PELVIS) 1V*R*
3 series · 3 of 3 positions shown · non-contrast
Comparison: June 03, 2014

CLINICAL DATA: Pain following fall

EXAM:
DG HIP (WITH OR WITHOUT PELVIS) 2+ RIGHT

[pelvis ap]
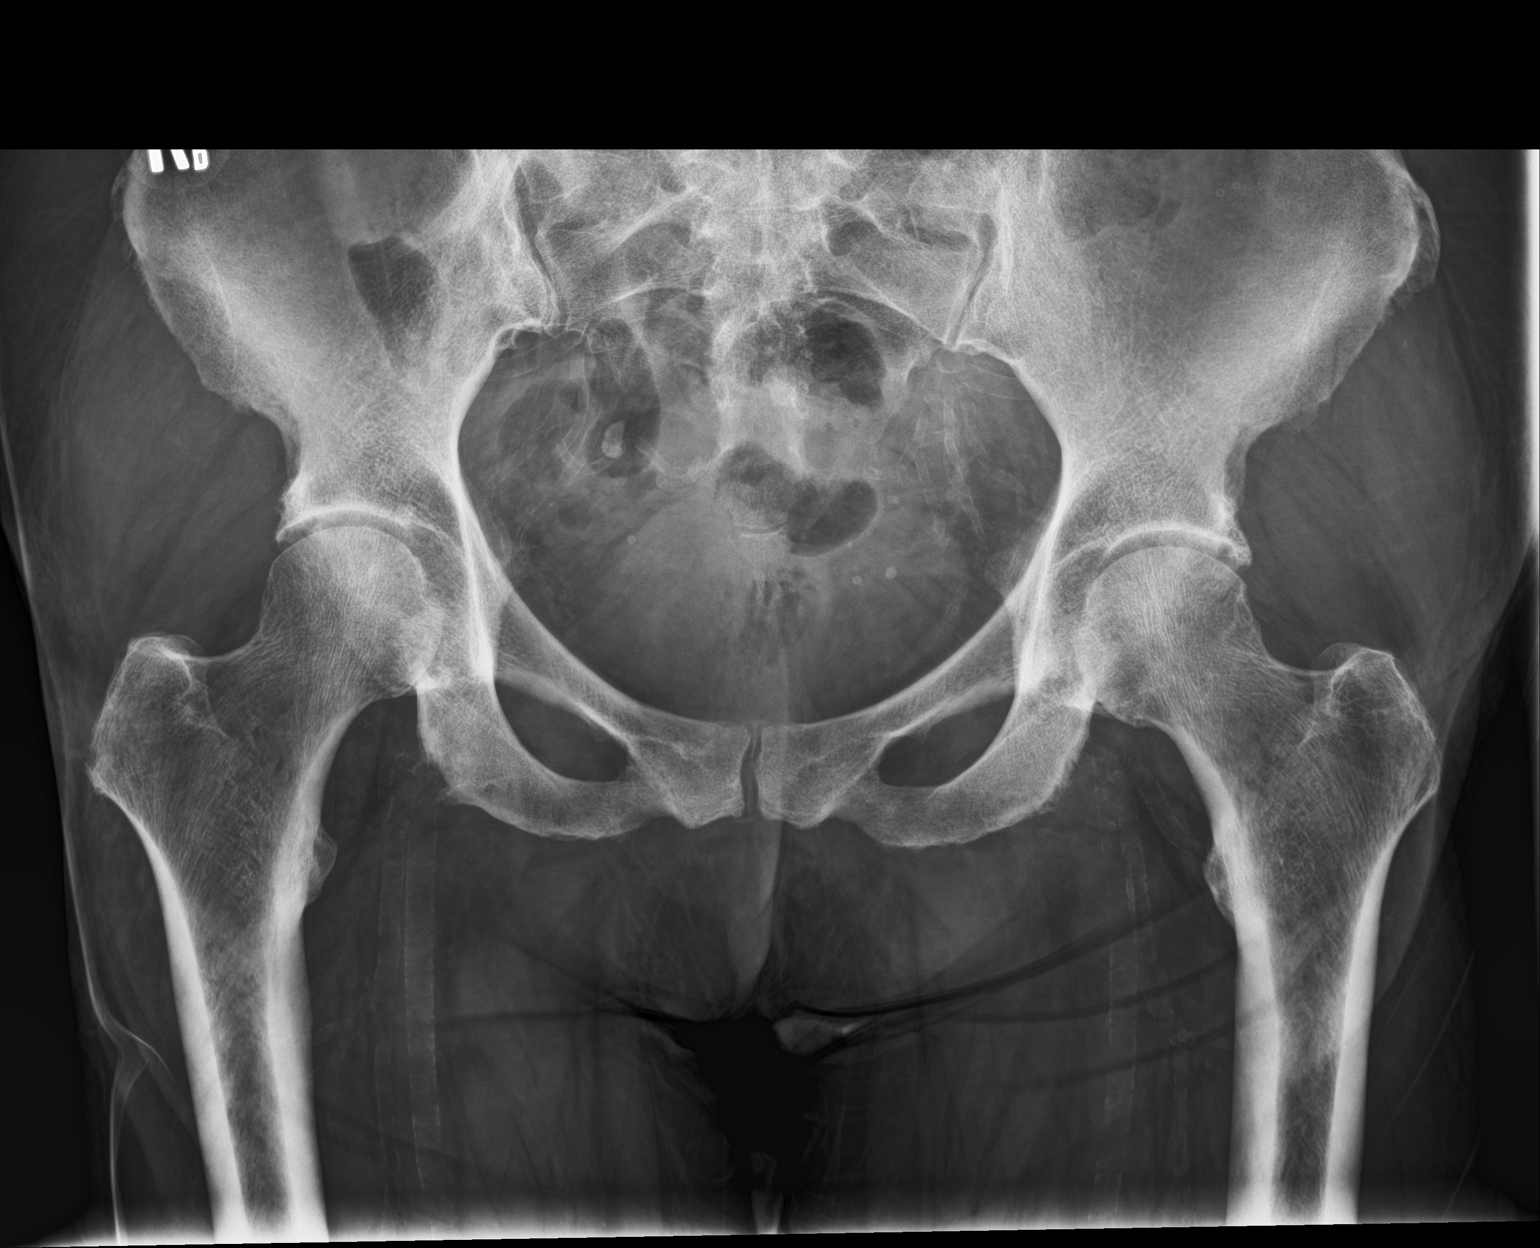

[hip ap]
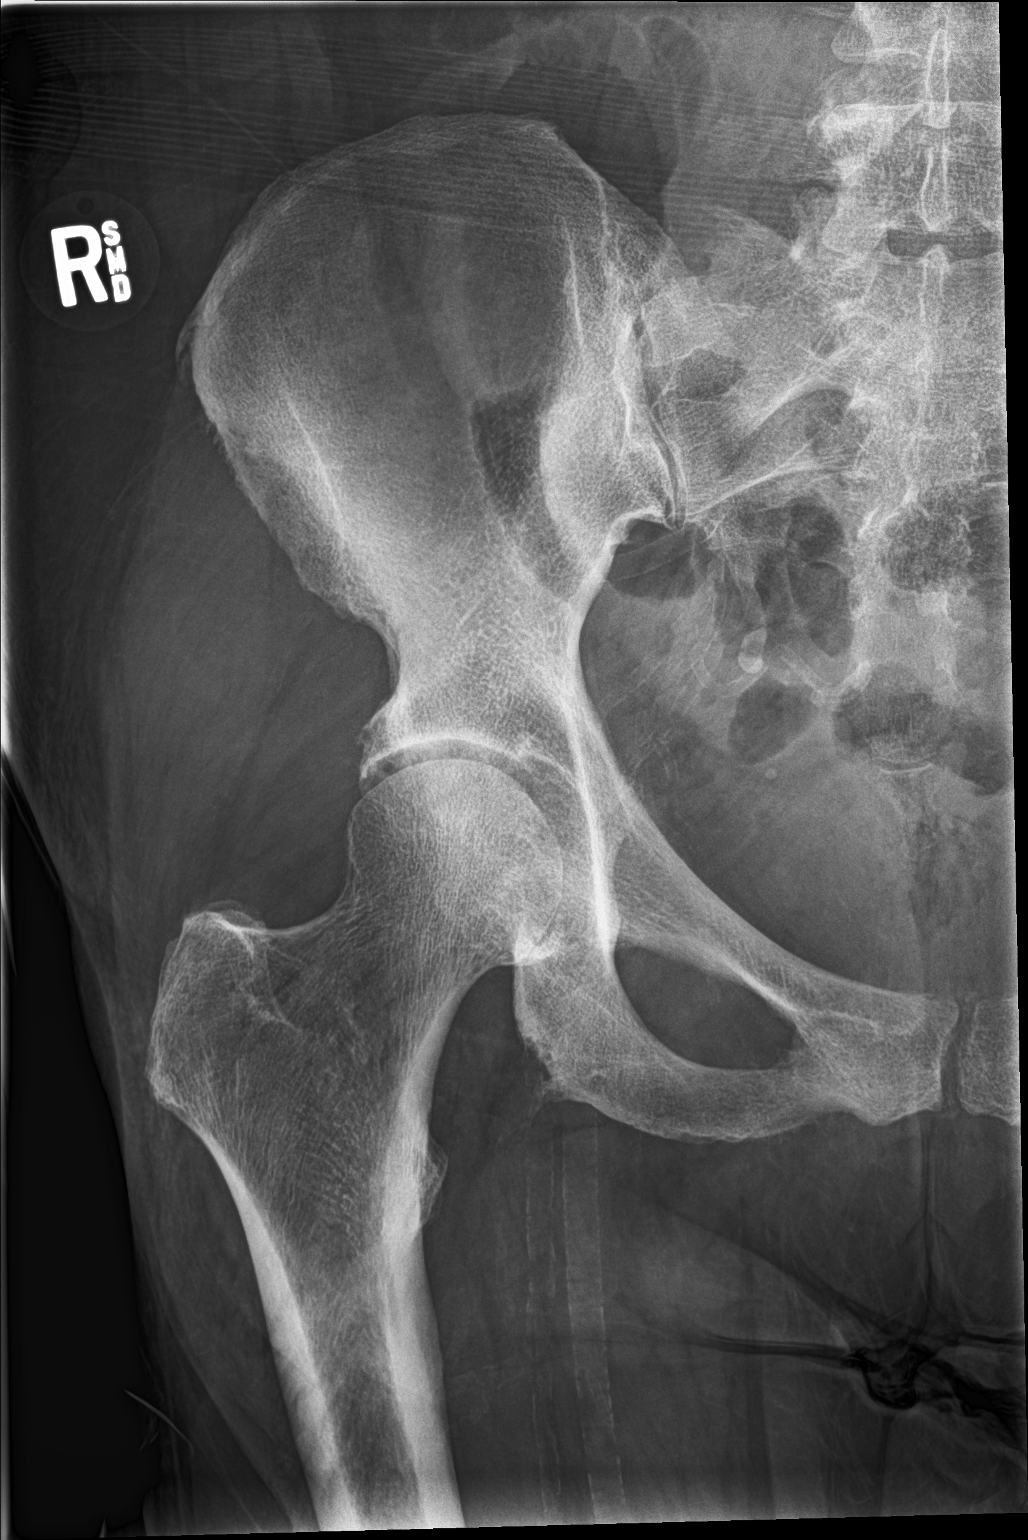

[hip lat]
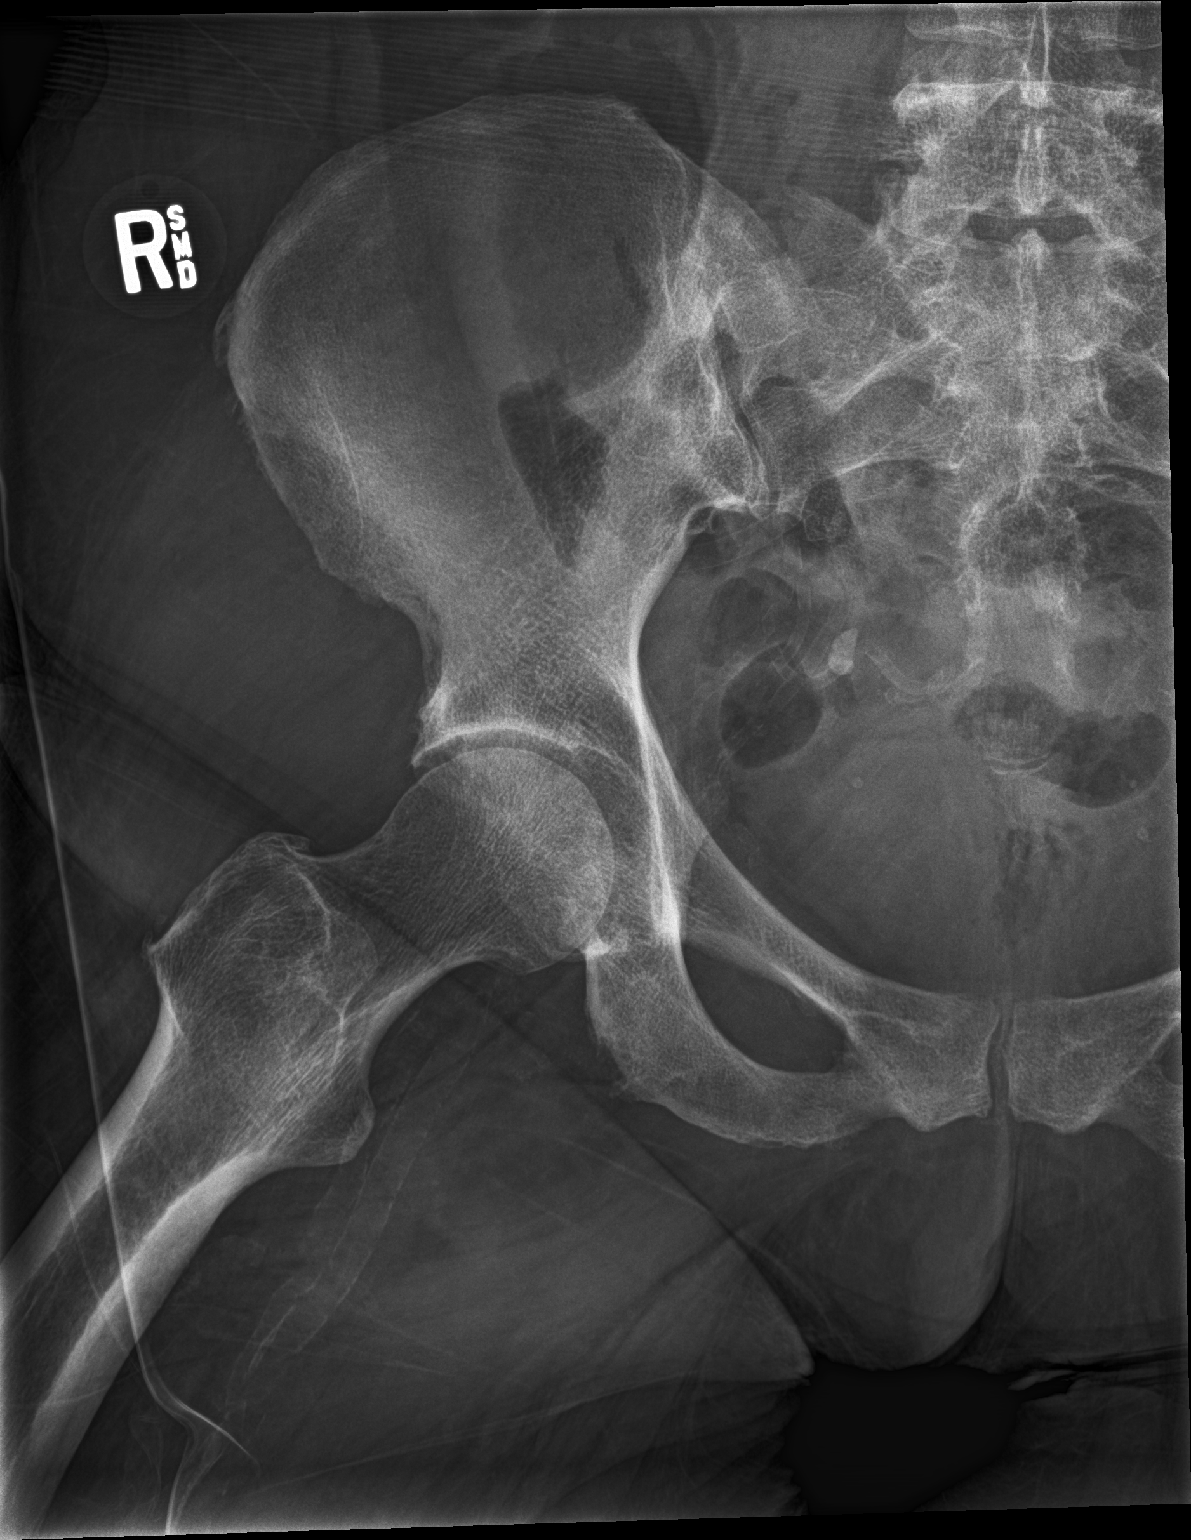

[3 of 3 positions shown; findings below may reference images not displayed]

FINDINGS: Frontal pelvis as well as frontal and lateral right hip images were
obtained. No fracture or dislocation. There is moderate narrowing of
each hip joint. No erosive change. There are multiple foci of pelvic
arterial vascular calcification.
IMPRESSION: Symmetric narrowing of each hip joint, moderate. No fracture or
dislocation. Pelvic arterial vascular calcifications/atherosclerosis
noted.

## 2019-10-13 IMAGING — CR DG LUMBAR SPINE 2-3V
3 series · 3 of 3 positions shown · non-contrast
Comparison: None.

CLINICAL DATA: Lumbago following fall

EXAM:
LUMBAR SPINE - 2-3 VIEW

[l-spine ap]
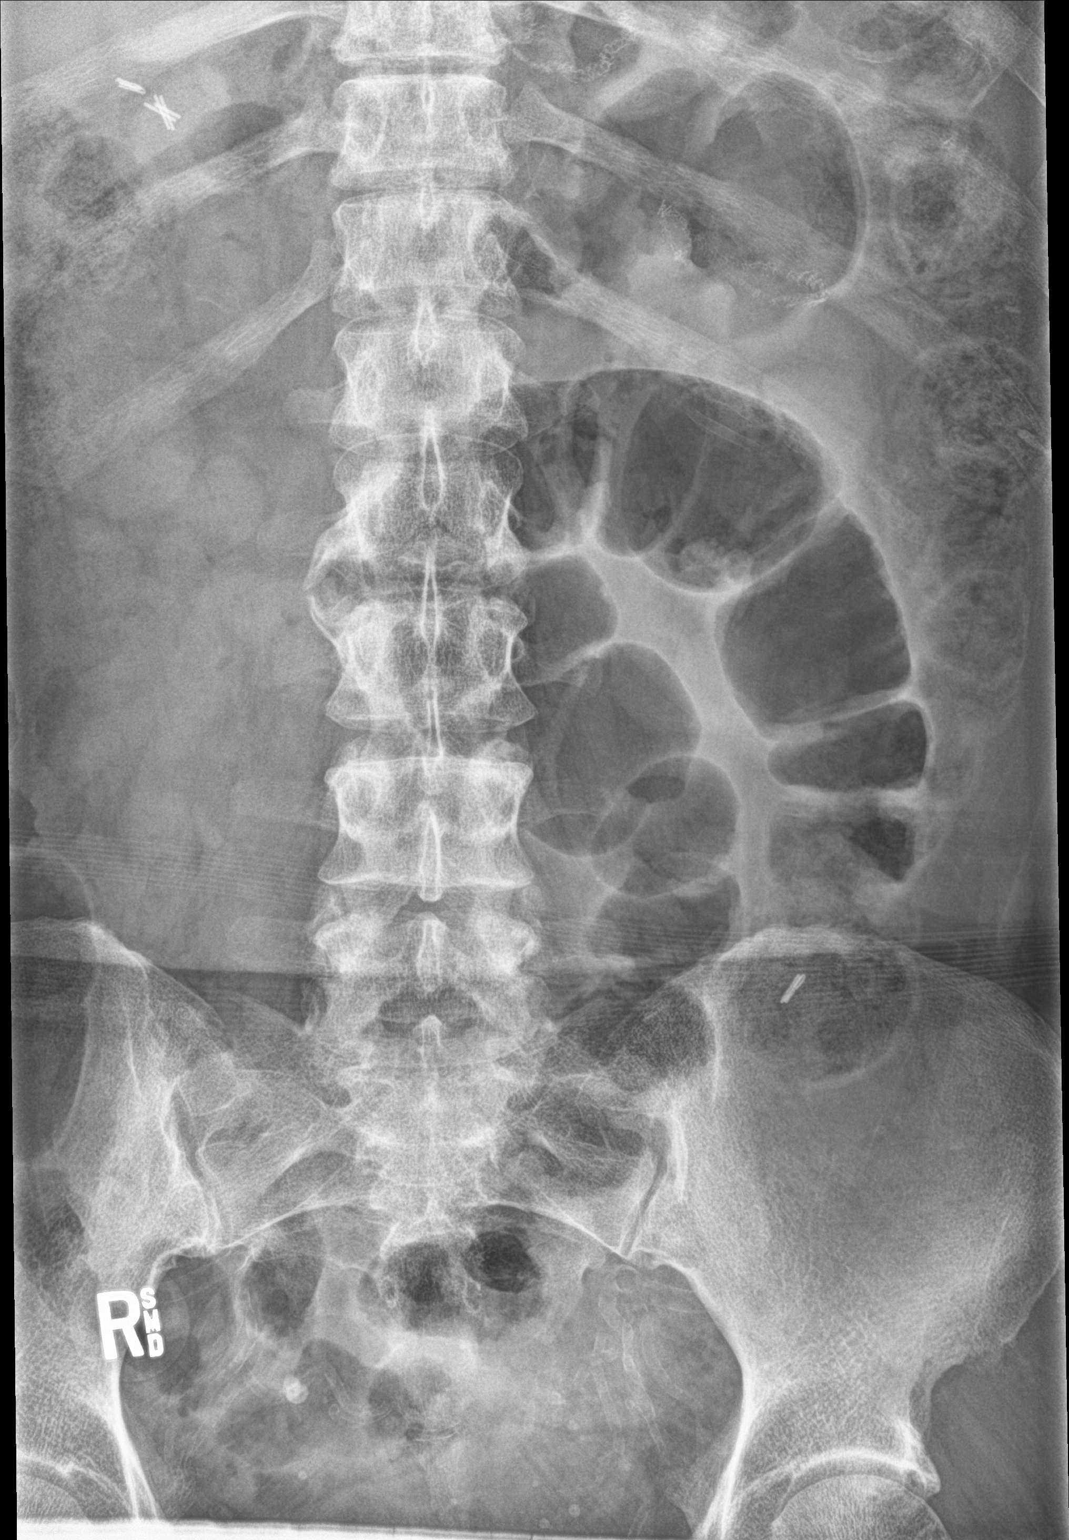

[l-spine lat]
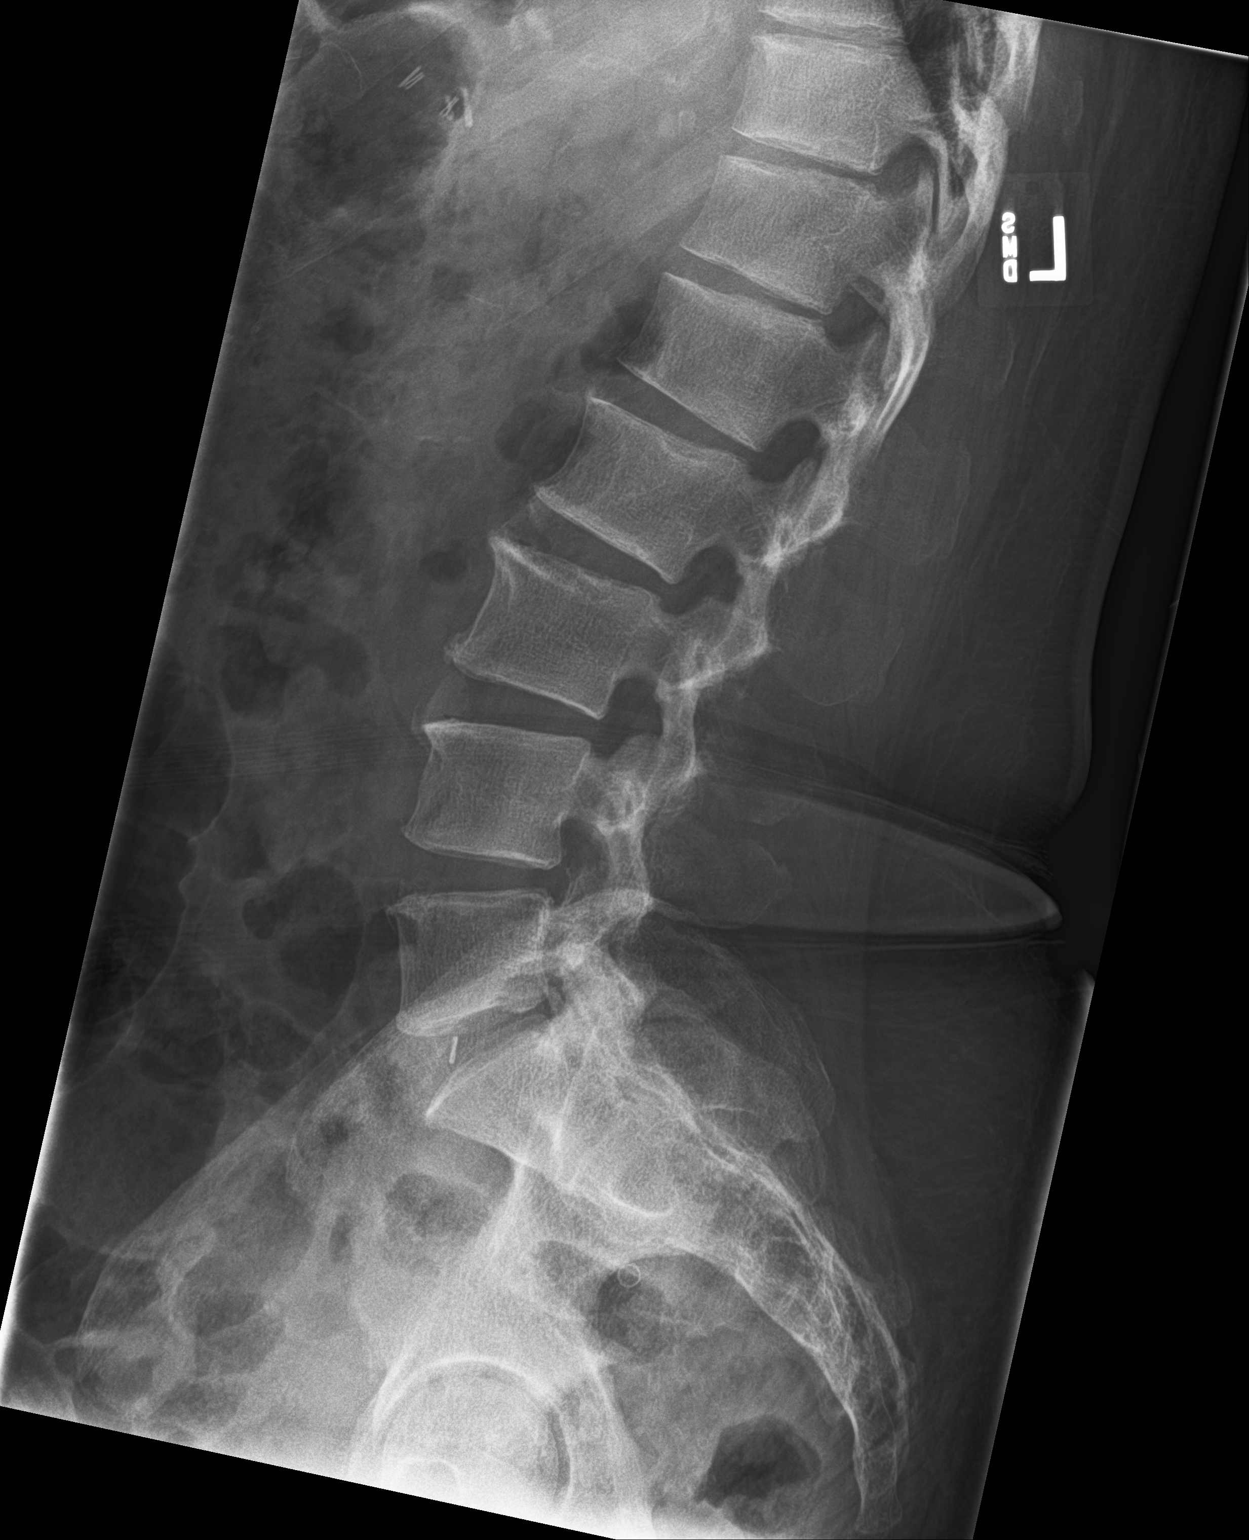

[l-spine spot]
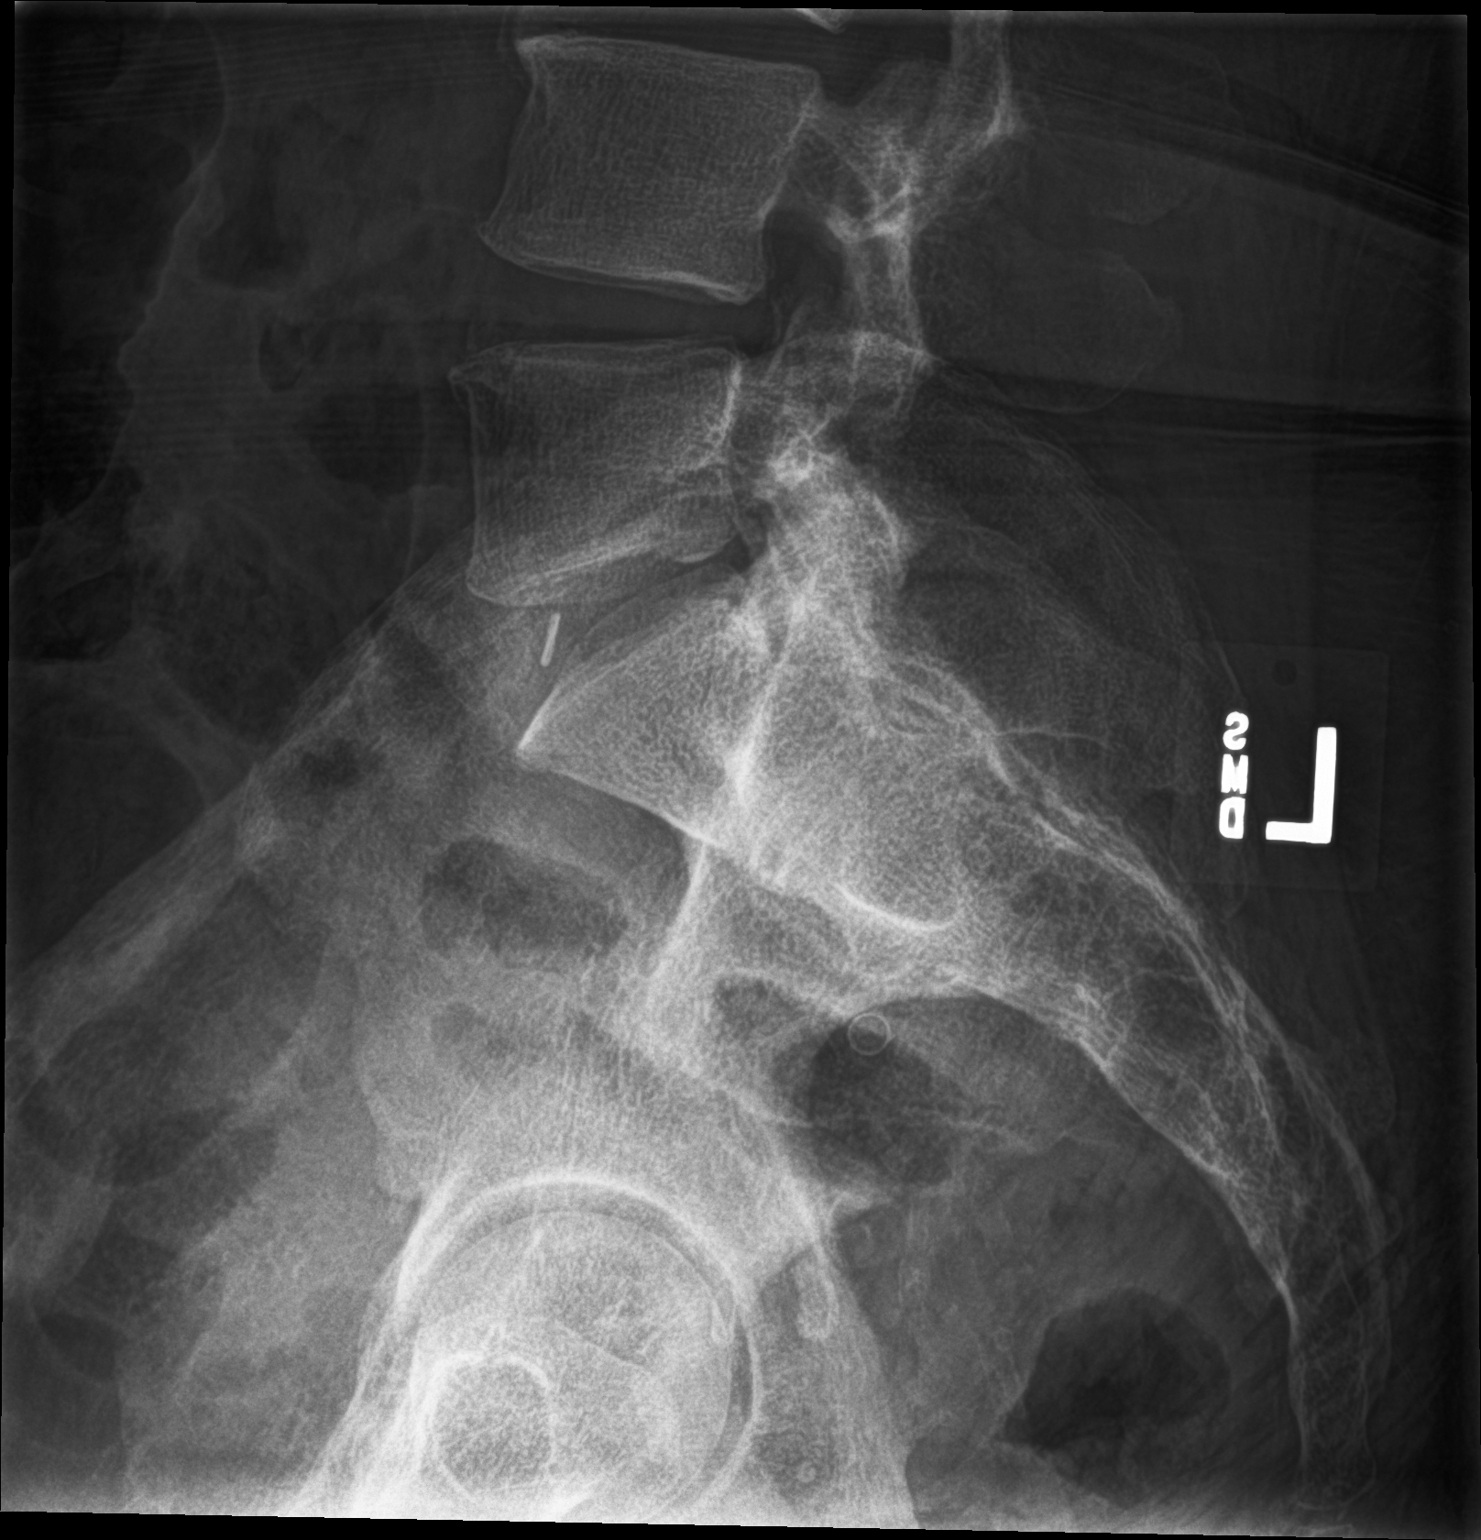

[3 of 3 positions shown; findings below may reference images not displayed]

FINDINGS: Frontal, lateral, and spot lumbosacral lateral images were obtained.
There are 5 non-rib-bearing lumbar type vertebral bodies. There is
no fracture or spondylolisthesis. Disc spaces appear unremarkable.
There are prominent lateral osteophytes at L2 and L3 on the right.
There also several small anterior osteophytes. There is diffuse
stool in the colon.
IMPRESSION: Slight osteoarthritic change. No fracture or spondylolisthesis. No
appreciable disc space narrowing. Fairly diffuse stool throughout
colon noted.

## 2019-10-13 MED ORDER — DEXTROSE 50 % IV SOLN
1.0000 | Freq: Once | INTRAVENOUS | Status: AC
  Administered 2019-10-13: 50 mL via INTRAVENOUS
  Filled 2019-10-13: qty 50

## 2019-10-13 MED ORDER — ONDANSETRON HCL 4 MG/2ML IJ SOLN
4.0000 mg | Freq: Four times a day (QID) | INTRAMUSCULAR | Status: DC | PRN
  Administered 2019-10-14 (×2): 4 mg via INTRAVENOUS
  Filled 2019-10-13 (×2): qty 2

## 2019-10-13 MED ORDER — ONDANSETRON HCL 4 MG PO TABS
4.0000 mg | ORAL_TABLET | Freq: Four times a day (QID) | ORAL | Status: DC | PRN
  Administered 2019-10-16: 4 mg via ORAL
  Filled 2019-10-13 (×2): qty 1

## 2019-10-13 MED ORDER — DEXTROSE 10 % IV SOLN: INTRAVENOUS | Status: DC

## 2019-10-13 MED ORDER — KETOROLAC TROMETHAMINE 30 MG/ML IJ SOLN
30.0000 mg | Freq: Once | INTRAMUSCULAR | Status: AC
  Administered 2019-10-13: 30 mg via INTRAVENOUS
  Filled 2019-10-13: qty 1

## 2019-10-13 MED ORDER — ENOXAPARIN SODIUM 40 MG/0.4ML ~~LOC~~ SOLN
40.0000 mg | SUBCUTANEOUS | Status: DC
  Administered 2019-10-14 – 2019-10-19 (×7): 40 mg via SUBCUTANEOUS
  Filled 2019-10-13 (×7): qty 0.4

## 2019-10-13 MED ORDER — ACETAMINOPHEN 650 MG RE SUPP: 650.0000 mg | Freq: Four times a day (QID) | RECTAL | Status: DC | PRN

## 2019-10-13 MED ORDER — ACETAMINOPHEN 325 MG PO TABS: 650.0000 mg | ORAL_TABLET | Freq: Four times a day (QID) | ORAL | Status: DC | PRN

## 2019-10-13 NOTE — H&P (Signed)
History and Physical   [name] IRW:[id number] DOB: 1971-01-09 DOA: 10/13/2019  Referring MD/NP/PA: Dr. Cheri Fowler  PCP: [name], MD   Outpatient Specialists: Dr. Ida Rogue, cardiology  Patient coming from: Home  Chief Complaint: Low blood sugar  HPI: [name] is a 49 y.o. female with medical history significant of diabetes, coronary artery disease status post multiple PCI's, iron deficiency anemia, history of CVA, hypertension, malignant melanoma and degenerative disc disease who presented with significant hypoglycemia.  Patient has not taking any diabetic medications in a while.  She has had previous hypoglycemic episode recently at which point she was found to have elevated insulin and C-peptide levels.  No identifiable source.  She is not on any insulin.  Patient checked her sugar last night it was down into the 40s.  She is taking orange juice and multiple medication but he continues to be low.  Her sugar has consistently been low at night for long time.  She is called her endocrinologist and was told to come in but did not make it.  Patient was seen in the ER again hypoglycemics.  Has received glucose orally as well as D5 drip.  Sugar is still in the low 100s.  She is being admitted with persistent hypoglycemia for evaluation and treatment.  ED Course: Temperature 100.1 blood pressure 166/78 pulse 85 respirate of 18 oxygen 95% room air.  CBC was entirely within normal chemistry showed BUN 22 creatinine 1.76 calcium 8.8.  This seems to be close to her baseline.  Glucose is now 153 on D10.  Blood sugar was 68 on arrival.  Patient is being admitted for 33  Review of Systems: As per HPI otherwise 10 point review of systems negative.    Past Medical History:  Diagnosis Date  . Acute colitis 01/27/2017  . Acute pyelonephritis   . Anemia    iron deficiency anemia  . Aortic arch aneurysm ([place])   . BRCA negative 2014  . CAD (coronary artery disease)     a. 08/2003 Cath: LAD 30-40-med Rx; b. 11/2014 PCI: LAD 80m(3.25x23 Xience Alpine DES); c. 06/2015 PCI: D1 (2.25x12 Resolute Integrity DES); d. 06/2017 PCI: Patent mLAD stent, D2 95 (PTCA); e. 09/2017 PCI: D2 99ost (CBA); d. 12/2017 Cath: LM nl, LAD 350m80d (small), D1 40ost, D2 95ost, LCX 40p, RCA 40ost/p->Med rx for D2 given restenosis.  . Colitis 06/03/2015  . Colon polyp   . CVA (cerebral vascular accident) ([facility]   Left side weakness.   . Degenerative tear of glenoid labrum of right shoulder 03/15/2017  . Diabetes mellitus without complication ([facility]  . Family history of breast cancer    BRCA neg 2014  . Femur fracture, left (HCT[place]9/10/2018  . Gastric ulcer 04/27/2011  . History of echocardiogram    a. 03/2017 Echo: EF 60-65%, no rwma; b. 02/2018 Echo: EF 60-65%, no rwma. Nl RV fxn. No cardiac source of emboli (admitted w/ stroke).  . Hypertension   . Malignant melanoma of skin of scalp ([facility]  . MI, acute, non ST segment elevation ([facility]  . Neuromuscular disorder ([facility]  . S/P drug eluting coronary stent placement 06/04/2015  . Sepsis ([facility]2/14/2019  . [facility]   a. 02/2018 MRI: 106m66mate acute/early subacute L medial frontal lobe inarct; b. 02/2018 MRA No large vessel occlusion or aneurysm. Mod to sev L P2 stenosis. thready L vertebral artery, diffusely dzs'd; c. 02/2018 Carotid U/S: <50% bilat  ICA dzs.    Past Surgical History:  Procedure Laterality Date  . APPENDECTOMY    . CARDIAC CATHETERIZATION N/A 11/09/2014   Procedure: Coronary Angiography;  Surgeon: Minna Merritts, MD;  Location: [place] CV LAB;  Service: Cardiovascular;  Laterality: N/A;  . CARDIAC CATHETERIZATION N/A 11/12/2014   Procedure: Coronary Stent Intervention;  Surgeon: Isaias Cowman, MD;  Location: [place] CV LAB;  Service: Cardiovascular;  Laterality: N/A;  . CARDIAC CATHETERIZATION N/A 04/18/2015   Procedure: Left Heart Cath and Coronary Angiography;  Surgeon: Minna Merritts, MD;  Location:  [place] CV LAB;  Service: Cardiovascular;  Laterality: N/A;  . CARDIAC CATHETERIZATION Left 06/04/2015   Procedure: Left Heart Cath and Coronary Angiography;  Surgeon: Wellington Hampshire, MD;  Location: [place] CV LAB;  Service: Cardiovascular;  Laterality: Left;  . CARDIAC CATHETERIZATION N/A 06/04/2015   Procedure: Coronary Stent Intervention;  Surgeon: Wellington Hampshire, MD;  Location: [place] CV LAB;  Service: Cardiovascular;  Laterality: N/A;  . CESAREAN SECTION  2001  . CHOLECYSTECTOMY N/A 11/18/2016   Procedure: LAPAROSCOPIC CHOLECYSTECTOMY WITH INTRAOPERATIVE CHOLANGIOGRAM;  Surgeon: Christene Lye, MD;  Location: [facility];  Service: General;  Laterality: N/A;  . COLONOSCOPY WITH PROPOFOL N/A 04/27/2016   Procedure: COLONOSCOPY WITH PROPOFOL;  Surgeon: [name], MD;  Location: [facility];  Service: Endoscopy;  Laterality: N/A;  . COLONOSCOPY WITH PROPOFOL N/A 01/12/2018   Procedure: COLONOSCOPY WITH PROPOFOL;  Surgeon: Toledo, Benay Pike, MD;  Location: [facility] ENDOSCOPY;  Service: Endoscopy;  Laterality: N/A;  . CORONARY ANGIOPLASTY    . CORONARY BALLOON ANGIOPLASTY N/A 06/29/2017   Procedure: CORONARY BALLOON ANGIOPLASTY;  Surgeon: Wellington Hampshire, MD;  Location: [place] CV LAB;  Service: Cardiovascular;  Laterality: N/A;  . CORONARY BALLOON ANGIOPLASTY N/A 09/20/2017   Procedure: CORONARY BALLOON ANGIOPLASTY;  Surgeon: Wellington Hampshire, MD;  Location: [place] CV LAB;  Service: Cardiovascular;  Laterality: N/A;  . DILATION AND CURETTAGE OF UTERUS    . ESOPHAGOGASTRODUODENOSCOPY (EGD) WITH PROPOFOL N/A 09/14/2014   Procedure: ESOPHAGOGASTRODUODENOSCOPY (EGD) WITH PROPOFOL;  Surgeon: Josefine Class, MD;  Location: [facility] ENDOSCOPY;  Service: Endoscopy;  Laterality: N/A;  . ESOPHAGOGASTRODUODENOSCOPY (EGD) WITH PROPOFOL N/A 04/27/2016   Procedure: ESOPHAGOGASTRODUODENOSCOPY (EGD) WITH PROPOFOL;  Surgeon: [name], MD;  Location: [place];  Service: Endoscopy;  Laterality: N/A;  Diabetic - oral meds  . ESOPHAGOGASTRODUODENOSCOPY (EGD) WITH PROPOFOL N/A 01/12/2018   Procedure: ESOPHAGOGASTRODUODENOSCOPY (EGD) WITH PROPOFOL;  Surgeon: Toledo, Benay Pike, MD;  Location: [facility] ENDOSCOPY;  Service: Endoscopy;  Laterality: N/A;  . ESOPHAGOGASTRODUODENOSCOPY (EGD) WITH PROPOFOL N/A 04/11/2019   Procedure: ESOPHAGOGASTRODUODENOSCOPY (EGD) WITH PROPOFOL;  Surgeon: [name], MD;  Location: [facility] ENDOSCOPY;  Service: Endoscopy;  Laterality: N/A;  . GASTRIC BYPASS  09/2009   [place] (IM) NAIL INTERTROCHANTERIC Left 10/13/2018   Procedure: INTRAMEDULLARY (IM) NAIL INTERTROCHANTRIC;  Surgeon: Leandrew Koyanagi, MD;  Location: [place];  Service: Orthopedics;  Laterality: Left;  . Left Carotid to sublcavian artery bypass w/ subclavian artery ligation     a. Performed @ [place].  . LEFT HEART CATH AND CORONARY ANGIOGRAPHY Left 06/29/2017   Procedure: LEFT HEART CATH AND CORONARY ANGIOGRAPHY;  Surgeon: Wellington Hampshire, MD;  Location: [place] CV LAB;  Service: Cardiovascular;  Laterality: Left;  . LEFT HEART CATH AND CORONARY ANGIOGRAPHY N/A 09/20/2017   Procedure: LEFT HEART CATH AND CORONARY ANGIOGRAPHY;  Surgeon: Wellington Hampshire, MD;  Location: [facility]  INVASIVE CV LAB;  Service: Cardiovascular;  Laterality: N/A;  . LEFT HEART CATH AND CORONARY ANGIOGRAPHY N/A 12/20/2017   Procedure: LEFT HEART CATH AND CORONARY ANGIOGRAPHY;  Surgeon: Wellington Hampshire, MD;  Location: [place] CV LAB;  Service: Cardiovascular;  Laterality: N/A;  . LEFT HEART CATH AND CORONARY ANGIOGRAPHY N/A 04/20/2019   Procedure: LEFT HEART CATH AND CORONARY ANGIOGRAPHY possible PCI;  Surgeon: Minna Merritts, MD;  Location: [place] CV LAB;  Service: Cardiovascular;  Laterality: N/A;  . MELANOMA EXCISION  2016   Dr. Evorn Gong  . [place]  2002  . RIGHT OOPHORECTOMY    . SHOULDER ARTHROSCOPY WITH OPEN ROTATOR CUFF  REPAIR Right 01/07/2016   Procedure: SHOULDER ARTHROSCOPY WITH DEBRIDMENT, SUBACHROMIAL DECOMPRESSION;  Surgeon: Corky Mull, MD;  Location: [facility] ORS;  Service: Orthopedics;  Laterality: Right;  . SHOULDER ARTHROSCOPY WITH OPEN ROTATOR CUFF REPAIR Right 03/16/2017   Procedure: SHOULDER ARTHROSCOPY WITH OPEN ROTATOR CUFF REPAIR POSSIBLE BICEPS TENODESIS;  Surgeon: Corky Mull, MD;  Location: [facility] OR[facility];  Service: Orthopedics;  Laterality: Right;  . TRIGGER FINGER RELEASE Right     [place]     reports that she quit smoking about 25 years ago. Her smoking use included cigarettes. She has never used smokeless tobacco. She reports that she does not drink alcohol and does not use drugs.  Allergies  Allergen Reactions  . Lipitor [Atorvastatin] Other (See Comments)    Leg pains  . Tramadol Other (See Comments)    Mouth feels like it's on fire    Family History  Problem Relation Age of Onset  . Hypertension Mother   . Anxiety disorder Mother   . Depression Mother   . Bipolar disorder Mother   . Heart disease Mother        No details  . Hyperlipidemia Mother   . Kidney disease Father   . Heart disease Father 7  . Hypertension Father   . Diabetes Father   . Stroke Father   . Colon cancer Father        dx in his 81's  . Anxiety disorder Father   . Depression Father   . Skin cancer Father   . Kidney disease Sister   . Thyroid nodules Sister   . Hypertension Sister   . Hypertension Sister   . Diabetes Sister   . Hyperlipidemia Sister   . Depression Sister   . Breast cancer Maternal Aunt 9  . Breast cancer Maternal Aunt 60  . Ovarian cancer Cousin   . Colon cancer Cousin   . Kidney cancer Cousin   . Breast cancer Other   . Bladder Cancer Neg Hx      Prior to Admission medications   Medication Sig Start Date End Date Taking? Authorizing Provider  acarbose (PRECOSE) 25 MG tablet Take 1 tablet by mouth 3 (three) times daily before meals. 09/15/19   [provider]   ALPRAZolam Duanne Moron) 1 MG tablet Take 1 tablet by mouth twice daily as needed Patient taking differently: Take 1 mg by mouth 2 (two) times daily as needed for anxiety.  08/14/19   [name], MD  aspirin 81 MG EC tablet Take 81 mg by mouth daily.     [provider]  buPROPion (WELLBUTRIN XL) 300 MG 24 hr tablet Take 1 tablet (300 mg total) by mouth daily. 03/15/19   [name], MD  carvedilol (COREG) 6.25 MG tablet Take 1 tablet (6.25 mg total) by mouth 2 (  two) times daily with a meal. 09/12/19 10/12/19  Alma Friendly, MD  Cholecalciferol 50 MCG (2000 UT) TABS Take 2,000 Units by mouth daily.    [provider]  clopidogrel (PLAVIX) 75 MG tablet Take 1 tablet (75 mg total) by mouth daily. 10/10/19 11/09/19  Minna Merritts, MD  diphenoxylate-atropine (LOMOTIL) 2.5-0.025 MG tablet TAKE 1 TABLET BY MOUTH ONCE TO 4 TIMES DAILY FOR DIARRHEA 09/25/19   [name], MD  EUTHYROX 25 MCG tablet TAKE 1 TABLET BY MOUTH ONCE DAILY BEFORE BREAKFAST 09/19/19   [name], MD  gabapentin (NEURONTIN) 300 MG capsule Take 1 capsule (300 mg total) by mouth 2 (two) times daily. 10/12/18   [name], MD  isosorbide mononitrate (IMDUR) 30 MG 24 hr tablet Take 3 tablets (90 mg total) by mouth daily. 09/13/19 10/13/19  Alma Friendly, MD  Multiple Vitamin (MULTI-VITAMIN) tablet Take 1 tablet by mouth daily.     [provider]  nitroGLYCERIN (NITROSTAT) 0.4 MG SL tablet Place 1 tablet (0.4 mg total) under the tongue every 5 (five) minutes as needed for chest pain. 04/24/19   Minna Merritts, MD  pantoprazole (PROTONIX) 40 MG tablet Take 1 tablet by mouth twice daily Patient taking differently: Take 40 mg by mouth 2 (two) times daily.  06/06/19   [name], MD  promethazine (PHENERGAN) 25 MG tablet Take 25 mg by mouth every 6 (six) hours as needed for nausea or vomiting.  07/25/19   [provider]  rosuvastatin (CRESTOR) 5 MG tablet Take 1 tablet (5  mg total) by mouth daily at 6 PM. 10/10/19 11/09/19  Minna Merritts, MD  traZODone (DESYREL) 100 MG tablet Take 100-200 mg by mouth at bedtime as needed for sleep.  03/06/19   [provider]  venlafaxine XR (EFFEXOR-XR) 75 MG 24 hr capsule Take 2 capsules (150 mg total) by mouth daily with breakfast. 08/22/19   [name], MD    Physical Exam: Vitals:   10/13/19 1249 10/13/19 1257 10/13/19 1523 10/13/19 1946  BP:  126/80 132/85 (!) 166/78  Pulse:  85 81 66  Resp:  '18 16 16  ' Temp:  100.1 F (37.8 C) 98.6 F (37 C)   TempSrc:  Oral Oral   SpO2:  99% 99% 100%  Weight: 68.5 kg     Height: '5\' 2"'  (1.575 m)         Constitutional: Acutely ill looking, no distress Vitals:   10/13/19 1249 10/13/19 1257 10/13/19 1523 10/13/19 1946  BP:  126/80 132/85 (!) 166/78  Pulse:  85 81 66  Resp:  '18 16 16  ' Temp:  100.1 F (37.8 C) 98.6 F (37 C)   TempSrc:  Oral Oral   SpO2:  99% 99% 100%  Weight: 68.5 kg     Height: '5\' 2"'  (1.575 m)      Eyes: PERRL, lids and conjunctivae normal ENMT: Mucous membranes are moist. Posterior pharynx clear of any exudate or lesions.Normal dentition.  Neck: normal, supple, no masses, no thyromegaly Respiratory: clear to auscultation bilaterally, no wheezing, no crackles. Normal respiratory effort. No accessory muscle use.  Cardiovascular: Sinus tachycardia no murmurs / rubs / gallops. No extremity edema. 2+ pedal pulses. No carotid bruits.  Abdomen: no tenderness, no masses palpated. No hepatosplenomegaly. Bowel sounds positive.  Musculoskeletal: no clubbing / cyanosis. No joint deformity upper and lower extremities. Good ROM, no contractures. Normal muscle tone.  Skin: no rashes, lesions, ulcers. No induration Neurologic:  CN 2-12 grossly intact. Sensation intact, DTR normal. Strength 5/5 in all 4.  Psychiatric: Normal judgment and insight. Alert and oriented x 3. Normal mood.     Labs on Admission: I have personally reviewed following labs and  imaging studies  CBC: Recent Labs  Lab 10/13/19 1300  WBC 9.2  NEUTROABS 7.2  HGB 13.0  HCT 38.7  MCV 82.5  PLT 233   Basic Metabolic Panel: Recent Labs  Lab 10/13/19 1300  NA 138  K 4.8  CL 106  CO2 23  GLUCOSE 153*  BUN 22*  CREATININE 1.76*  CALCIUM 8.8*   GFR: Estimated Creatinine Clearance: 34.7 mL/min (A) (by C-G formula based on SCr of 1.76 mg/dL (H)). Liver Function Tests: Recent Labs  Lab 10/13/19 1300  AST 28  ALT 28  ALKPHOS 93  BILITOT 0.7  PROT 7.4  ALBUMIN 3.8   No results for input(s): LIPASE, AMYLASE in the last 168 hours. No results for input(s): AMMONIA in the last 168 hours. Coagulation Profile: No results for input(s): INR, PROTIME in the last 168 hours. Cardiac Enzymes: No results for input(s): CKTOTAL, CKMB, CKMBINDEX, TROPONINI in the last 168 hours. BNP (last 3 results) No results for input(s): PROBNP in the last 8760 hours. HbA1C: No results for input(s): HGBA1C in the last 72 hours. CBG: Recent Labs  Lab 10/13/19 1257 10/13/19 1736 10/13/19 1831 10/13/19 2007  GLUCAP 156* 50* 59* 157*   Lipid Profile: No results for input(s): CHOL, HDL, LDLCALC, TRIG, CHOLHDL, LDLDIRECT in the last 72 hours. Thyroid Function Tests: No results for input(s): TSH, T4TOTAL, FREET4, T3FREE, THYROIDAB in the last 72 hours. Anemia Panel: No results for input(s): VITAMINB12, FOLATE, FERRITIN, TIBC, IRON, RETICCTPCT in the last 72 hours. Urine analysis:    Component Value Date/Time   COLORURINE YELLOW (A) 10/13/2019 1931   APPEARANCEUR CLOUDY (A) 10/13/2019 1931   APPEARANCEUR Cloudy (A) 07/17/2014 1455   LABSPEC 1.008 10/13/2019 1931   LABSPEC 1.012 12/29/2013 1210   PHURINE 6.0 10/13/2019 1931   GLUCOSEU NEGATIVE 10/13/2019 1931   GLUCOSEU >=500 12/29/2013 1210   HGBUR NEGATIVE 10/13/2019 1931   HGBUR negative 04/14/2010 1435   BILIRUBINUR NEGATIVE 10/13/2019 1931   BILIRUBINUR small 08/22/2019 1014   BILIRUBINUR Negative 07/17/2014  1455   BILIRUBINUR Negative 12/29/2013 1210   KETONESUR NEGATIVE 10/13/2019 1931   PROTEINUR NEGATIVE 10/13/2019 1931   UROBILINOGEN 0.2 08/22/2019 1014   UROBILINOGEN negative 04/14/2010 1435   NITRITE NEGATIVE 10/13/2019 1931   LEUKOCYTESUR LARGE (A) 10/13/2019 1931   LEUKOCYTESUR Negative 12/29/2013 1210   Sepsis Labs: '@LABRCNTIP' (procalcitonin:4,lacticidven:4) )No results found for this or any previous visit (from the past 240 hour(s)).   Radiological Exams on Admission: No results found.    Assessment/Plan Principal Problem:   Hypoglycemia Active Problems:   Depression with anxiety   Essential hypertension   GERD (gastroesophageal reflux disease)   Chronic systolic CHF (congestive heart failure) (HCC)   Hypothyroidism   CAD (coronary artery disease)   H/O gastric bypass     #1 persistent hypoglycemia: Patient will be admitted and initiated on continue D10 drip.  Check CBG every hour until he stabilizes.  Serum insulin and C-peptide levels have been drawn.  If insulin is elevated again then we will have to think of Insulinoma.  Patient will need endocrinology consult of close follow-up after discharge once her blood sugar stabilizes.  Avoid all antidiabetics for now.  Patient placed on regular diet.  #2 depression anxiety: Resume home regimen.  #3 GERD:  Continue with PPIs  #4 hypothyroidism: Replete with levothyroxine  #5 coronary artery disease: Appears stable at baseline.  Patient missed her appointment yesterday with cardiology.  Continue monitor on telemetry  #6 chronic systolic CHF: Appears compensated at this point.   DVT prophylaxis: Lovenox Code Status: Full Family Communication: No family at bedside Disposition Plan: Home Consults called: None Admission status: Inpatient  Severity of Illness: The appropriate patient status for this patient is INPATIENT. Inpatient status is judged to be reasonable and necessary in order to provide the required  intensity of service to ensure the patient's safety. The patient's presenting symptoms, physical exam findings, and initial radiographic and laboratory data in the context of their chronic comorbidities is felt to place them at high risk for further clinical deterioration. Furthermore, it is not anticipated that the patient will be medically stable for discharge from the hospital within 2 midnights of admission. The following factors support the patient status of inpatient.   " The patient's presenting symptoms include weakness and hypoglycemia. " The worrisome physical exam findings include weak and mildly clammy. " The initial radiographic and laboratory data are worrisome because of CHF. " The chronic co-morbidities include CHF and coronary artery disease.   * I certify that at the point of admission it is my clinical judgment that the patient will require inpatient hospital care spanning beyond 2 midnights from the point of admission due to high intensity of service, high risk for further deterioration and high frequency of surveillance required.Barbette Merino MD [facility] Pager [phone]  If 7PM-7AM, please contact night-coverage [email or web address] Password [facility]  10/13/2019, 8:17 PM

## 2019-10-13 NOTE — ED Notes (Signed)
Patient called out and stated that she thought her "sugar was dropping because I'm sweaty and shaky." Fingerstick glucose obtained was 97. Patient did have a [employer] cup. [name] NP informed.

## 2019-10-13 NOTE — ED Notes (Signed)
Hypoglycemia noted. Pt given OJ and sandwich tray. MD notified.

## 2019-10-13 NOTE — ED Provider Notes (Signed)
[facility] Emergency Department Provider Note   ____________________________________________   First MD Initiated Contact with Patient 10/13/19 1709     (approximate)  I have reviewed the triage vital signs and the nursing notes.   HISTORY  Chief Complaint Hypoglycemia    HPI [name] is a 49 y.o. female with a stated past medical history of anemia, CAD, CVA, diabetes, and persistent hypoglycemia who presents for hypoglycemia episodes.  Patient states that she had to check her blood sugar every night last night and it was as low as the 40s with associated tremors and diaphoresis.  Patient states that she was eating a sandwich prior to going to sleep and has not used any insulin.  Patient does state that she takes Metformin and has taken it on time as prescribed.  Patient also endorses associated lightheadedness.  Patient states that she has had episodes similar to this in the past but they have "never been able to find anything wrong".         Past Medical History:  Diagnosis Date  . Acute colitis 01/27/2017  . Acute pyelonephritis   . Anemia    iron deficiency anemia  . Aortic arch aneurysm ([place])   . BRCA negative 2014  . CAD (coronary artery disease)    a. 08/2003 Cath: LAD 30-40-med Rx; b. 11/2014 PCI: LAD 40m(3.25x23 Xience Alpine DES); c. 06/2015 PCI: D1 (2.25x12 Resolute Integrity DES); d. 06/2017 PCI: Patent mLAD stent, D2 95 (PTCA); e. 09/2017 PCI: D2 99ost (CBA); d. 12/2017 Cath: LM nl, LAD 365m80d (small), D1 40ost, D2 95ost, LCX 40p, RCA 40ost/p->Med rx for D2 given restenosis.  . Colitis 06/03/2015  . Colon polyp   . CVA (cerebral vascular accident) ([facility]Ed[place]   Left side weakness.   . Degenerative tear of glenoid labrum of right shoulder 03/15/2017  . Diabetes mellitus without complication (HCWest Liberty  . Family history of breast cancer    BRCA neg 2014  . Femur fracture, left (HCMamou9/10/2018  . Gastric ulcer 04/27/2011  . History of  echocardiogram    a. 03/2017 Echo: EF 60-65%, no rwma; b. 02/2018 Echo: EF 60-65%, no rwma. Nl RV fxn. No cardiac source of emboli (admitted w/ stroke).  . Hypertension   . Malignant melanoma of skin of scalp (HCProspect [place]  . MI, acute, non ST segment elevation ([place]CO[place]uc[place]  . Neuromuscular disorder (HCWest [place]  . S/P drug eluting coronary stent placement 06/04/2015  . Sepsis (HCWest Winfield2/14/2019  . [facility]   a. 02/2018 MRI: 31m631mate acute/early subacute L medial frontal lobe inarct; b. 02/2018 MRA No large vessel occlusion or aneurysm. Mod to sev L P2 stenosis. thready L vertebral artery, diffusely dzs'd; c. 02/2018 Carotid U/S: <50% bilat ICA dzs.    Patient Active Problem List   Diagnosis Date Noted  . Chronic kidney disease, stage 3a 09/02/2019  . Hypoglycemia   . Family history of breast cancer 05/23/2019  . Family history of colon cancer 05/23/2019  . Abnormal LFTs 04/18/2019  . Esophageal dysphagia   . Gastrointestinal tract imaging abnormality   . Trochanteric bursitis, left hip 03/17/2019  . Malignant neoplasm of vulva, unspecified (HCCCrofton2/06/2019  . Peripheral vascular disease, unspecified (HCCPal[place]2/06/2019  . Paroxysmal supraventricular tachycardia (HCCMorrow[place]2/06/2019  . Contusion of left hip 03/06/2019  . Chest pain, moderate coronary artery risk 01/27/2019  . Melena   . Hypertensive urgency   . Recurrent chest pain 01/19/2019  .  Depression, major, single episode, moderate ([place]) 12/09/2018  . Closed nondisplaced intertrochanteric fracture of left femur ([place]) 10/12/2018  . Hypotension 06/26/2018  . Autonomic neuropathy 03/24/2018  . Acute delirium 03/03/2018  . H/O gastric bypass 03/02/2018  . Hypertension associated with stage 3 chronic kidney disease due to type 2 diabetes mellitus ([place]) 02/20/2018  . SI (sacroiliac) joint dysfunction 12/02/2017  . Effort angina ([place]) 06/29/2017  . Unstable angina ([place]) 06/24/2017  . Syncope 04/09/2017  . Insomnia 03/18/2017  . Ischemic cardiomyopathy     . Arthritis   . Anxiety   . Tendinitis of upper biceps tendon of right shoulder 03/16/2017  . Hx of colonic polyps   . H/O medication noncompliance 12/14/2015  . CAD (coronary artery disease)   . Hypertensive heart disease   . Status post bariatric surgery 06/04/2015  . Carotid stenosis 04/30/2015  . Stable angina pectoris ([place]) 04/17/2015  . Iron deficiency anemia 03/22/2015  . Vitamin B12 deficiency 02/18/2015  . Misuse of medications for pain 02/18/2015  . Major depressive disorder, recurrent, severe with psychotic features ([place]) 02/15/2015  . Helicobacter pylori infection 11/23/2014  . Hemiparesis, left ([place]) 11/23/2014  . Malignant melanoma ([place]) 08/25/2014  . Chronic systolic CHF (congestive heart failure) ([place])   . Incomplete bladder emptying 07/12/2014  . Hypothyroidism 12/30/2013  . Aberrant subclavian artery 11/17/2013  . Multiple sclerosis ([place]) 11/02/2013  . History of CVA with residual deficit 06/20/2013  . Headache, migraine 05/29/2013  . Hyperlipidemia   . GERD (gastroesophageal reflux disease)   . Neuropathy ([place]) 01/02/2011  . Stroke ([place]) 06/21/2008  . Depression with anxiety 05/01/2008  . Essential hypertension 05/01/2008    Past Surgical History:  Procedure Laterality Date  . APPENDECTOMY    . CARDIAC CATHETERIZATION N/A 11/09/2014   Procedure: Coronary Angiography;  Surgeon: Minna Merritts, MD;  Location: [place] [facility];  Service: Cardiovascular;  Laterality: N/A;  . CARDIAC CATHETERIZATION N/A 11/12/2014   Procedure: Coronary Stent Intervention;  Surgeon: Isaias Cowman, MD;  Location: [place] [facility];  Service: Cardiovascular;  Laterality: N/A;  . CARDIAC CATHETERIZATION N/A 04/18/2015   Procedure: Left Heart Cath and Coronary Angiography;  Surgeon: Minna Merritts, MD;  Location: [place] [facility];  Service: Cardiovascular;  Laterality: N/A;  . CARDIAC CATHETERIZATION Left 06/04/2015   Procedure: Left Heart Cath and Coronary  Angiography;  Surgeon: Wellington Hampshire, MD;  Location: [place] [facility];  Service: Cardiovascular;  Laterality: Left;  . CARDIAC CATHETERIZATION N/A 06/04/2015   Procedure: Coronary Stent Intervention;  Surgeon: Wellington Hampshire, MD;  Location: [place] [facility];  Service: Cardiovascular;  Laterality: N/A;  . CESAREAN SECTION  2001  . CHOLECYSTECTOMY N/A 11/18/2016   Procedure: LAPAROSCOPIC CHOLECYSTECTOMY WITH INTRAOPERATIVE CHOLANGIOGRAM;  Surgeon: Christene Lye, MD;  Location: [facility];  Service: General;  Laterality: N/A;  . COLONOSCOPY WITH PROPOFOL N/A 04/27/2016   Procedure: COLONOSCOPY WITH PROPOFOL;  Surgeon: [name], MD;  Location: [place];  Service: Endoscopy;  Laterality: N/A;  . COLONOSCOPY WITH PROPOFOL N/A 01/12/2018   Procedure: COLONOSCOPY WITH PROPOFOL;  Surgeon: Tol[place], Benay Pike, MD;  Location: [facility];  Service: Endoscopy;  Laterality: N/A;  . CORONARY ANGIOPLASTY    . CORONARY BALLOON ANGIOPLASTY N/A 06/29/2017   Procedure: CORONARY BALLOON ANGIOPLASTY;  Surgeon: Wellington Hampshire, MD;  Location: [place] [facility];  Service: Cardiovascular;  Laterality: N/A;  . CORONARY BALLOON ANGIOPLASTY N/A 09/20/2017   Procedure: CORONARY BALLOON ANGIOPLASTY;  Surgeon: Wellington Hampshire,  MD;  Location: [place] [facility];  Service: Cardiovascular;  Laterality: N/A;  . DILATION AND CURETTAGE OF UTERUS    . ESOPHAGOGASTRODUODENOSCOPY (EGD) WITH PROPOFOL N/A 09/14/2014   Procedure: ESOPHAGOGASTRODUODENOSCOPY (EGD) WITH PROPOFOL;  Surgeon: Josefine Class, MD;  [facility];  Service: Endoscopy;  Laterality: N/A;  . ESOPHAGOGASTRODUODENOSCOPY (EGD) WITH PROPOFOL N/A 04/27/2016   Procedure: ESOPHAGOGASTRODUODENOSCOPY (EGD) WITH PROPOFOL;  Surgeon: [name], MD;  Location: [place];  Service: Endoscopy;  Laterality: N/A;  Diabetic - oral meds  . ESOPHAGOGASTRODUODENOSCOPY (EGD) WITH PROPOFOL N/A 01/12/2018   Procedure:  ESOPHAGOGASTRODUODENOSCOPY (EGD) WITH PROPOFOL;  Surgeon: Tol[place], Benay Pike, MD;  Location: [facility]OS[facility];  Service: Endoscopy;  Laterality: N/A;  . ESOPHAGOGASTRODUODENOSCOPY (EGD) WITH PROPOFOL N/A 04/11/2019   Procedure: ESOPHAGOGASTRODUODENOSCOPY (EGD) WITH PROPOFOL;  Surgeon: [name], MD;  Location: [facility]OS[facility];  Service: Endoscopy;  Laterality: N/A;  . GASTRIC BYPASS  09/2009   [place] (IM) NAIL INTERTROCHANTERIC Left 10/13/2018   Procedure: INTRAMEDULLARY (IM) NAIL INTERTROCHANTRIC;  Surgeon: Leandrew Koyanagi, MD;  Location: [place];  Service: Orthopedics;  Laterality: Left;  . Left Carotid to sublcavian artery bypass w/ subclavian artery ligation     a. Performed @ [place].  . LEFT HEART CATH AND CORONARY ANGIOGRAPHY Left 06/29/2017   Procedure: LEFT HEART CATH AND CORONARY ANGIOGRAPHY;  Surgeon: Wellington Hampshire, MD;  Location: [place] [facility];  Service: Cardiovascular;  Laterality: Left;  . LEFT HEART CATH AND CORONARY ANGIOGRAPHY N/A 09/20/2017   Procedure: LEFT HEART CATH AND CORONARY ANGIOGRAPHY;  Surgeon: Wellington Hampshire, MD;  Location: [place] [facility];  Service: Cardiovascular;  Laterality: N/A;  . LEFT HEART CATH AND CORONARY ANGIOGRAPHY N/A 12/20/2017   Procedure: LEFT HEART CATH AND CORONARY ANGIOGRAPHY;  Surgeon: Wellington Hampshire, MD;  Location: [place] [facility];  Service: Cardiovascular;  Laterality: N/A;  . LEFT HEART CATH AND CORONARY ANGIOGRAPHY N/A 04/20/2019   Procedure: LEFT HEART CATH AND CORONARY ANGIOGRAPHY possible PCI;  Surgeon: Minna Merritts, MD;  Location: [place] [facility];  Service: Cardiovascular;  Laterality: N/A;  . MELANOMA EXCISION  2016   Dr. Evorn Gong  . [place]  2002  . RIGHT OOPHORECTOMY    . SHOULDER ARTHROSCOPY WITH OPEN ROTATOR CUFF REPAIR Right 01/07/2016   Procedure: SHOULDER ARTHROSCOPY WITH DEBRIDMENT, SUBACHROMIAL DECOMPRESSION;  Surgeon: Corky Mull, MD;  Location: [facility];   Service: Orthopedics;  Laterality: Right;  . SHOULDER ARTHROSCOPY WITH OPEN ROTATOR CUFF REPAIR Right 03/16/2017   Procedure: SHOULDER ARTHROSCOPY WITH OPEN ROTATOR CUFF REPAIR POSSIBLE BICEPS TENODESIS;  Surgeon: Corky Mull, MD;  Location: [facility];  Service: Orthopedics;  Laterality: Right;  . TRIGGER FINGER RELEASE Right     [place]    Prior to Admission medications   Medication Sig Start Date End Date Taking? Authorizing Provider  acarbose (PRECOSE) 25 MG tablet Take 1 tablet by mouth 3 (three) times daily before meals. 09/15/19   [provider]  ALPRAZolam Duanne Moron) 1 MG tablet Take 1 tablet by mouth twice daily as needed Patient taking differently: Take 1 mg by mouth 2 (two) times daily as needed for anxiety.  08/14/19   [name], MD  aspirin 81 MG EC tablet Take 81 mg by mouth daily.     [provider]  buPROPion (WELLBUTRIN XL) 300 MG 24 hr tablet Take 1 tablet (300 mg total) by mouth daily. 03/15/19   [name], MD  carvedilol (COREG) 6.25 MG tablet Take 1 tablet (6.25 mg total) by mouth 2 (two) times daily with a meal. 09/12/19 10/12/19  Alma Friendly, MD  Cholecalciferol 50 MCG (2000 UT) TABS Take 2,000 Units by mouth daily.    [provider]  clopidogrel (PLAVIX) 75 MG tablet Take 1 tablet (75 mg total) by mouth daily. 10/10/19 11/09/19  Minna Merritts, MD  diphenoxylate-atropine (LOMOTIL) 2.5-0.025 MG tablet TAKE 1 TABLET BY MOUTH ONCE TO 4 TIMES DAILY FOR DIARRHEA 09/25/19   [name], MD  EUTHYROX 25 MCG tablet TAKE 1 TABLET BY MOUTH ONCE DAILY BEFORE BREAKFAST 09/19/19   [name], MD  gabapentin (NEURONTIN) 300 MG capsule Take 1 capsule (300 mg total) by mouth 2 (two) times daily. 10/12/18   [name], MD  isosorbide mononitrate (IMDUR) 30 MG 24 hr tablet Take 3 tablets (90 mg total) by mouth daily. 09/13/19 10/13/19  Alma Friendly, MD  Multiple Vitamin (MULTI-VITAMIN) tablet Take 1 tablet by mouth daily.      [provider]  nitroGLYCERIN (NITROSTAT) 0.4 MG SL tablet Place 1 tablet (0.4 mg total) under the tongue every 5 (five) minutes as needed for chest pain. 04/24/19   Minna Merritts, MD  pantoprazole (PROTONIX) 40 MG tablet Take 1 tablet by mouth twice daily Patient taking differently: Take 40 mg by mouth 2 (two) times daily.  06/06/19   [name], MD  promethazine (PHENERGAN) 25 MG tablet Take 25 mg by mouth every 6 (six) hours as needed for nausea or vomiting.  07/25/19   [provider]  rosuvastatin (CRESTOR) 5 MG tablet Take 1 tablet (5 mg total) by mouth daily at 6 PM. 10/10/19 11/09/19  Minna Merritts, MD  traZODone (DESYREL) 100 MG tablet Take 100-200 mg by mouth at bedtime as needed for sleep.  03/06/19   [provider]  venlafaxine XR (EFFEXOR-XR) 75 MG 24 hr capsule Take 2 capsules (150 mg total) by mouth daily with breakfast. 08/22/19   [name], MD    Allergies Lipitor [atorvastatin] and Tramadol  Family History  Problem Relation Age of Onset  . Hypertension Mother   . Anxiety disorder Mother   . Depression Mother   . Bipolar disorder Mother   . Heart disease Mother        No details  . Hyperlipidemia Mother   . Kidney disease Father   . Heart disease Father 82  . Hypertension Father   . Diabetes Father   . Stroke Father   . Colon cancer Father        dx in his 36's  . Anxiety disorder Father   . Depression Father   . Skin cancer Father   . Kidney disease Sister   . Thyroid nodules Sister   . Hypertension Sister   . Hypertension Sister   . Diabetes Sister   . Hyperlipidemia Sister   . Depression Sister   . Breast cancer Maternal Aunt 79  . Breast cancer Maternal Aunt 58  . Ovarian cancer Cousin   . Colon cancer Cousin   . Kidney cancer Cousin   . Breast cancer Other   . Bladder Cancer Neg Hx     Social History Social History   Tobacco Use  . Smoking status: Former Smoker    Types: Cigarettes    Quit date:  08/31/1994    Years since quitting: 25.1  . Smokeless tobacco: Never Used  . Tobacco comment: quit 28  years ago  Vaping Use  . Vaping Use: Never used  Substance Use Topics  . Alcohol use: No    Alcohol/week: 0.0 standard drinks  . Drug use: No    Review of Systems  Constitutional: No fever/chills Eyes: No visual changes. ENT: No sore throat. Cardiovascular: Denies chest pain. Respiratory: Denies shortness of breath. Gastrointestinal: No abdominal pain.  No nausea, no vomiting.  No diarrhea. Genitourinary: Negative for dysuria. Musculoskeletal: Negative for acute arthralgias Skin: Negative for rash. Neurological: Negative for headaches, weakness/numbness/paresthesias in any extremity Psychiatric: Negative for suicidal ideation/homicidal ideation   ____________________________________________   PHYSICAL EXAM:  VITAL SIGNS: ED Triage Vitals  Enc Vitals Group     BP 10/13/19 1257 126/80     Pulse Rate 10/13/19 1257 85     Resp 10/13/19 1257 18     Temp 10/13/19 1257 100.1 F (37.8 C)     Temp Source 10/13/19 1257 Oral     SpO2 10/13/19 1257 99 %     Weight 10/13/19 1249 151 lb (68.5 kg)     Height 10/13/19 1249 5' 2" (1.575 m)     Head Circumference --      Peak Flow --      Pain Score 10/13/19 1249 0     Pain Loc --      Pain Edu? --      Excl. in [place]? --     Constitutional: Alert and oriented. Well appearing and in no acute distress. Eyes: Conjunctivae are normal. PERRL. EOMI. Head: Atraumatic. Nose: No congestion/rhinnorhea. Mouth/Throat: Mucous membranes are moist. Neck: No stridor Cardiovascular: Normal rate, regular rhythm. Grossly normal heart sounds.  Good peripheral circulation. Respiratory: Normal respiratory effort.  No retractions. Gastrointestinal: Soft and nontender. No distention. Musculoskeletal: No lower extremity tenderness nor edema.  No joint effusions. Neurologic:  Normal speech and language. No gross focal neurologic deficits are  appreciated. Skin:  Skin is warm and dry. No rash noted. Psychiatric: Mood and affect are normal. Speech and behavior are normal.  ____________________________________________   LABS (all labs ordered are listed, but only abnormal results are displayed)  Labs Reviewed  COMPREHENSIVE METABOLIC PANEL - Abnormal; Notable for the following components:      Result Value   Glucose, Bld 153 (*)    BUN 22 (*)    Creatinine, Ser 1.76 (*)    Calcium 8.8 (*)    GFR calc non Af Amer 33 (*)    GFR calc Af Amer 38 (*)    All other components within normal limits  GLUCOSE, CAPILLARY - Abnormal; Notable for the following components:   Glucose-Capillary 156 (*)    All other components within normal limits  GLUCOSE, CAPILLARY - Abnormal; Notable for the following components:   Glucose-Capillary 50 (*)    All other components within normal limits  GLUCOSE, CAPILLARY - Abnormal; Notable for the following components:   Glucose-Capillary 59 (*)    All other components within normal limits  SARS CORONAVIRUS 2 BY RT PCR (HOSPITAL ORDER, [place] [facility])  CBC WITH DIFFERENTIAL/PLATELET  URINALYSIS, COMPLETE (UACMP) WITH MICROSCOPIC  INSULIN AND C-PEPTIDE, SERUM   ____________________________________________  EKG  PROCEDURES  Procedure(s) performed (including Critical Care):  .Critical Care Performed by: Naaman Plummer, MD Authorized by: Naaman Plummer, MD   Critical care provider statement:    Critical care time (minutes):  49   Critical care time was exclusive of:  Separately billable procedures and treating other patients   Critical  care was necessary to treat or prevent imminent or life-threatening deterioration of the following conditions:  Endocrine crisis   Critical care was time spent personally by me on the following activities:  Discussions with consultants, evaluation of patient's response to treatment, examination of patient, ordering and performing  treatments and interventions, ordering and review of laboratory studies, pulse oximetry, re-evaluation of patient's condition, obtaining history from patient or surrogate and review of old charts     ____________________________________________   INITIAL IMPRESSION / [place] / ED COURSE      Presents with persistent hypoglycemia Given History, Exam, and Workup I have a low suspicion for sepsis induced hypoglycemia, alcohol induced hypoglycemia, suicidal ideation/purposeful overdose attempt.   Treatment: D10 drip (165m/hr)  Disposition: There is a significant risk that the patient may become hypoglycemic again if discharged home, they will be admitted to the hospital for further observation and continued work-up of undifferentiated new onset hypoglycemia with frequent glucose checks.      ____________________________________________   FINAL CLINICAL IMPRESSION(S) / ED DIAGNOSES  Final diagnoses:  Hypoglycemia due to endogenous hyperinsulinemia     ED Discharge Orders    None       Note:  This document was prepared using Dragon voice recognition software and may include unintentional dictation errors.   BNaaman Plummer MD 10/13/19 2000

## 2019-10-13 NOTE — ED Notes (Signed)
Patient was given orange juice for c/o feeling "my sugar drop."

## 2019-10-13 NOTE — ED Notes (Signed)
Patient is alert and oriented. NAD.

## 2019-10-13 NOTE — ED Triage Notes (Signed)
Here for problems with blood sugar dropping in the 40s while sleeping.  Reports sugar last night was 40 but this AM 98.  Pt has already seen endocrinology and is without an answer.  Here today to get checked again.  Does not takes DM meds.

## 2019-10-13 NOTE — ED Notes (Signed)
Patient is eating a [employer] cup. NAD. Patient ambulated with a steady gait to and from hallway restroom.

## 2019-10-14 DIAGNOSIS — E11649 Type 2 diabetes mellitus with hypoglycemia without coma: Secondary | ICD-10-CM | POA: Diagnosis not present

## 2019-10-14 DIAGNOSIS — I25118 Atherosclerotic heart disease of native coronary artery with other forms of angina pectoris: Secondary | ICD-10-CM | POA: Diagnosis not present

## 2019-10-14 DIAGNOSIS — E1142 Type 2 diabetes mellitus with diabetic polyneuropathy: Secondary | ICD-10-CM | POA: Diagnosis not present

## 2019-10-14 DIAGNOSIS — I2699 Other pulmonary embolism without acute cor pulmonale: Secondary | ICD-10-CM | POA: Diagnosis not present

## 2019-10-14 DIAGNOSIS — R55 Syncope and collapse: Secondary | ICD-10-CM | POA: Diagnosis not present

## 2019-10-14 DIAGNOSIS — I129 Hypertensive chronic kidney disease with stage 1 through stage 4 chronic kidney disease, or unspecified chronic kidney disease: Secondary | ICD-10-CM | POA: Diagnosis not present

## 2019-10-14 DIAGNOSIS — I252 Old myocardial infarction: Secondary | ICD-10-CM | POA: Diagnosis not present

## 2019-10-14 DIAGNOSIS — E1122 Type 2 diabetes mellitus with diabetic chronic kidney disease: Secondary | ICD-10-CM | POA: Diagnosis not present

## 2019-10-14 DIAGNOSIS — I2510 Atherosclerotic heart disease of native coronary artery without angina pectoris: Secondary | ICD-10-CM | POA: Diagnosis not present

## 2019-10-14 LAB — GLUCOSE, CAPILLARY
Glucose-Capillary: 92 mg/dL (ref 70–99)
Glucose-Capillary: 146 mg/dL — ABNORMAL HIGH (ref 70–99)
Glucose-Capillary: 277 mg/dL — ABNORMAL HIGH (ref 70–99)
Glucose-Capillary: 178 mg/dL — ABNORMAL HIGH (ref 70–99)
Glucose-Capillary: 142 mg/dL — ABNORMAL HIGH (ref 70–99)
Glucose-Capillary: 110 mg/dL — ABNORMAL HIGH (ref 70–99)
Glucose-Capillary: 79 mg/dL (ref 70–99)
Glucose-Capillary: 142 mg/dL — ABNORMAL HIGH (ref 70–99)
Glucose-Capillary: 172 mg/dL — ABNORMAL HIGH (ref 70–99)
Glucose-Capillary: 91 mg/dL (ref 70–99)
Glucose-Capillary: 118 mg/dL — ABNORMAL HIGH (ref 70–99)
Glucose-Capillary: 65 mg/dL — ABNORMAL LOW (ref 70–99)
Glucose-Capillary: 119 mg/dL — ABNORMAL HIGH (ref 70–99)
Glucose-Capillary: 122 mg/dL — ABNORMAL HIGH (ref 70–99)
Glucose-Capillary: 35 mg/dL — CL (ref 70–99)
Glucose-Capillary: 66 mg/dL — ABNORMAL LOW (ref 70–99)
Glucose-Capillary: 48 mg/dL — ABNORMAL LOW (ref 70–99)
Glucose-Capillary: 59 mg/dL — ABNORMAL LOW (ref 70–99)
Glucose-Capillary: 177 mg/dL — ABNORMAL HIGH (ref 70–99)

## 2019-10-14 LAB — COMPREHENSIVE METABOLIC PANEL
ALT: 24 U/L (ref 0–44)
AST: 23 U/L (ref 15–41)
Albumin: 3.3 g/dL — ABNORMAL LOW (ref 3.5–5.0)
Alkaline Phosphatase: 78 U/L (ref 38–126)
Anion gap: 10 (ref 5–15)
BUN: 19 mg/dL (ref 6–20)
CO2: 23 mmol/L (ref 22–32)
Calcium: 8.7 mg/dL — ABNORMAL LOW (ref 8.9–10.3)
Chloride: 100 mmol/L (ref 98–111)
Creatinine, Ser: 1.73 mg/dL — ABNORMAL HIGH (ref 0.44–1.00)
GFR calc Af Amer: 39 mL/min — ABNORMAL LOW (ref >60)
GFR calc non Af Amer: 34 mL/min — ABNORMAL LOW (ref >60)
Glucose, Bld: 123 mg/dL — ABNORMAL HIGH (ref 70–99)
Potassium: 4.1 mmol/L (ref 3.5–5.1)
Sodium: 133 mmol/L — ABNORMAL LOW (ref 135–145)
Total Bilirubin: 0.6 mg/dL (ref 0.3–1.2)
Total Protein: 6.5 g/dL (ref 6.5–8.1)

## 2019-10-14 LAB — BASIC METABOLIC PANEL
Anion gap: 7 (ref 5–15)
BUN: 19 mg/dL (ref 6–20)
CO2: 24 mmol/L (ref 22–32)
Calcium: 8.4 mg/dL — ABNORMAL LOW (ref 8.9–10.3)
Chloride: 102 mmol/L (ref 98–111)
Creatinine, Ser: 1.56 mg/dL — ABNORMAL HIGH (ref 0.44–1.00)
GFR calc Af Amer: 44 mL/min — ABNORMAL LOW (ref >60)
GFR calc non Af Amer: 38 mL/min — ABNORMAL LOW (ref >60)
Glucose, Bld: 103 mg/dL — ABNORMAL HIGH (ref 70–99)
Potassium: 4.1 mmol/L (ref 3.5–5.1)
Sodium: 133 mmol/L — ABNORMAL LOW (ref 135–145)

## 2019-10-14 LAB — CBC
HCT: 33.6 % — ABNORMAL LOW (ref 36.0–46.0)
Hemoglobin: 11.4 g/dL — ABNORMAL LOW (ref 12.0–15.0)
MCH: 27.9 pg (ref 26.0–34.0)
MCHC: 33.9 g/dL (ref 30.0–36.0)
MCV: 82.2 fL (ref 80.0–100.0)
Platelets: 157 10*3/uL (ref 150–400)
RBC: 4.09 MIL/uL (ref 3.87–5.11)
RDW: 13.9 % (ref 11.5–15.5)
WBC: 9.9 10*3/uL (ref 4.0–10.5)
nRBC: 0 % (ref 0.0–0.2)

## 2019-10-14 LAB — TROPONIN I (HIGH SENSITIVITY)
Troponin I (High Sensitivity): 7 ng/L (ref <18)
Troponin I (High Sensitivity): 8 ng/L (ref <18)

## 2019-10-14 LAB — MRSA PCR SCREENING: MRSA by PCR: POSITIVE — AB

## 2019-10-14 MED ORDER — GABAPENTIN 300 MG PO CAPS
300.0000 mg | ORAL_CAPSULE | Freq: Two times a day (BID) | ORAL | Status: DC
  Administered 2019-10-14 – 2019-10-20 (×12): 300 mg via ORAL
  Filled 2019-10-14 (×13): qty 1

## 2019-10-14 MED ORDER — PANTOPRAZOLE SODIUM 40 MG PO TBEC
40.0000 mg | DELAYED_RELEASE_TABLET | Freq: Two times a day (BID) | ORAL | Status: DC
  Administered 2019-10-14 – 2019-10-20 (×13): 40 mg via ORAL
  Filled 2019-10-14 (×13): qty 1

## 2019-10-14 MED ORDER — ROSUVASTATIN CALCIUM 5 MG PO TABS
5.0000 mg | ORAL_TABLET | Freq: Every day | ORAL | Status: DC
  Administered 2019-10-14 – 2019-10-20 (×7): 5 mg via ORAL
  Filled 2019-10-14 (×7): qty 1

## 2019-10-14 MED ORDER — DEXTROSE 50 % IV SOLN
25.0000 g | Freq: Once | INTRAVENOUS | Status: AC
  Administered 2019-10-14: 25 g via INTRAVENOUS
  Filled 2019-10-14: qty 50

## 2019-10-14 MED ORDER — CARVEDILOL 3.125 MG PO TABS
6.2500 mg | ORAL_TABLET | Freq: Two times a day (BID) | ORAL | Status: DC
  Administered 2019-10-14 – 2019-10-17 (×7): 6.25 mg via ORAL
  Filled 2019-10-14: qty 1
  Filled 2019-10-14 (×5): qty 2

## 2019-10-14 MED ORDER — ALPRAZOLAM 0.5 MG PO TABS
1.0000 mg | ORAL_TABLET | Freq: Two times a day (BID) | ORAL | Status: DC | PRN
  Administered 2019-10-14 – 2019-10-17 (×3): 1 mg via ORAL
  Filled 2019-10-14 (×3): qty 2

## 2019-10-14 MED ORDER — PROMETHAZINE HCL 25 MG PO TABS
25.0000 mg | ORAL_TABLET | Freq: Four times a day (QID) | ORAL | Status: DC | PRN
  Administered 2019-10-14 – 2019-10-18 (×2): 25 mg via ORAL
  Filled 2019-10-14 (×5): qty 1

## 2019-10-14 MED ORDER — MUPIROCIN 2 % EX OINT
1.0000 "application " | TOPICAL_OINTMENT | Freq: Two times a day (BID) | CUTANEOUS | Status: AC
  Administered 2019-10-14 – 2019-10-18 (×9): 1 via NASAL
  Filled 2019-10-14: qty 22

## 2019-10-14 MED ORDER — SODIUM CHLORIDE 0.9 % IV SOLN: INTRAVENOUS | Status: DC

## 2019-10-14 MED ORDER — ENSURE ENLIVE PO LIQD
237.0000 mL | Freq: Two times a day (BID) | ORAL | Status: DC
  Administered 2019-10-15 (×2): 237 mL via ORAL

## 2019-10-14 MED ORDER — VENLAFAXINE HCL ER 150 MG PO CP24
150.0000 mg | ORAL_CAPSULE | Freq: Every day | ORAL | Status: DC
  Administered 2019-10-14 – 2019-10-20 (×7): 150 mg via ORAL
  Filled 2019-10-14: qty 1
  Filled 2019-10-14: qty 2
  Filled 2019-10-14 (×6): qty 1

## 2019-10-14 MED ORDER — CLOPIDOGREL BISULFATE 75 MG PO TABS
75.0000 mg | ORAL_TABLET | Freq: Every day | ORAL | Status: DC
  Administered 2019-10-14 – 2019-10-20 (×7): 75 mg via ORAL
  Filled 2019-10-14 (×7): qty 1

## 2019-10-14 MED ORDER — ACARBOSE 50 MG PO TABS
25.0000 mg | ORAL_TABLET | Freq: Three times a day (TID) | ORAL | Status: DC
  Administered 2019-10-15 – 2019-10-20 (×15): 25 mg via ORAL
  Filled 2019-10-14 (×21): qty 1

## 2019-10-14 MED ORDER — ASPIRIN EC 81 MG PO TBEC
81.0000 mg | DELAYED_RELEASE_TABLET | Freq: Every day | ORAL | Status: DC
  Administered 2019-10-14 – 2019-10-20 (×7): 81 mg via ORAL
  Filled 2019-10-14 (×7): qty 1

## 2019-10-14 MED ORDER — ALPRAZOLAM 0.5 MG PO TABS
1.0000 mg | ORAL_TABLET | Freq: Every evening | ORAL | Status: DC | PRN
  Administered 2019-10-14 – 2019-10-19 (×4): 1 mg via ORAL
  Filled 2019-10-14 (×4): qty 2

## 2019-10-14 MED ORDER — BUPROPION HCL ER (XL) 150 MG PO TB24
300.0000 mg | ORAL_TABLET | Freq: Every day | ORAL | Status: DC
  Administered 2019-10-14 – 2019-10-20 (×7): 300 mg via ORAL
  Filled 2019-10-14 (×2): qty 2
  Filled 2019-10-14: qty 1
  Filled 2019-10-14 (×6): qty 2

## 2019-10-14 MED ORDER — CHLORHEXIDINE GLUCONATE CLOTH 2 % EX PADS
6.0000 | MEDICATED_PAD | Freq: Every day | CUTANEOUS | Status: DC
  Administered 2019-10-17: 6 via TOPICAL

## 2019-10-14 MED ORDER — NITROGLYCERIN 0.4 MG SL SUBL
0.4000 mg | SUBLINGUAL_TABLET | SUBLINGUAL | Status: DC | PRN
  Administered 2019-10-16 – 2019-10-17 (×6): 0.4 mg via SUBLINGUAL
  Filled 2019-10-14 (×3): qty 1

## 2019-10-14 MED ORDER — DEXTROSE 10 % IV SOLN
INTRAVENOUS | Status: DC
  Filled 2019-10-14: qty 1000

## 2019-10-14 MED ORDER — ADULT MULTIVITAMIN W/MINERALS CH
1.0000 | ORAL_TABLET | Freq: Every day | ORAL | Status: DC
  Administered 2019-10-15 – 2019-10-20 (×6): 1 via ORAL
  Filled 2019-10-14 (×6): qty 1

## 2019-10-14 MED ORDER — TRAZODONE HCL 100 MG PO TABS
100.0000 mg | ORAL_TABLET | Freq: Every evening | ORAL | Status: DC | PRN
  Administered 2019-10-15 – 2019-10-17 (×3): 100 mg via ORAL
  Filled 2019-10-14 (×4): qty 1

## 2019-10-14 MED ORDER — LEVOTHYROXINE SODIUM 25 MCG PO TABS
25.0000 ug | ORAL_TABLET | Freq: Every day | ORAL | Status: DC
  Administered 2019-10-14 – 2019-10-20 (×7): 25 ug via ORAL
  Filled 2019-10-14 (×7): qty 1

## 2019-10-14 MED ORDER — ISOSORBIDE MONONITRATE ER 30 MG PO TB24
90.0000 mg | ORAL_TABLET | Freq: Every day | ORAL | Status: DC
  Administered 2019-10-14 – 2019-10-17 (×4): 90 mg via ORAL
  Filled 2019-10-14 (×4): qty 3

## 2019-10-14 NOTE — Progress Notes (Signed)
Hypoglycemic Event  CBG: 35  Treatment:Give 8oz of juice, rechek CBG in 15 minutes  Symptoms: None, a&o, Patient states "I didn't feel my blood sugar dropping"  Follow-up CBG: Time:1626 CBG Result:122  Possible Reasons for Event:not eating enough, snacks, graham crackers, peanut butter, juice were at bedside  Comments/MD notified:Dr Posey Pronto notified, no new orders    [name]

## 2019-10-14 NOTE — ED Notes (Signed)
Randol Kern, NP made aware of decreased CBG.

## 2019-10-14 NOTE — Progress Notes (Signed)
Spoke with [name]. Pt eating and drinking and blood sugar continue to drop. NP to place orders.

## 2019-10-14 NOTE — ED Notes (Signed)
Attempted to call report. Unit will call back shortly.

## 2019-10-14 NOTE — Progress Notes (Signed)
[facility] [place] NAME: [name]    MR#:  [id number]  DATE OF BIRTH:  [birth year]  SUBJECTIVE:  came in with recurrent low sugar at home. Patient has had these episodes in the past. Currently doing well. No new complaints. She did eat some breakfast  REVIEW OF SYSTEMS:   Review of Systems  Constitutional: Negative for chills, fever and weight loss.  HENT: Negative for ear discharge, ear pain and nosebleeds.   Eyes: Negative for blurred vision, pain and discharge.  Respiratory: Negative for sputum production, shortness of breath, wheezing and stridor.   Cardiovascular: Negative for chest pain, palpitations, orthopnea and PND.  Gastrointestinal: Negative for abdominal pain, diarrhea, nausea and vomiting.  Genitourinary: Negative for frequency and urgency.  Musculoskeletal: Negative for back pain and joint pain.  Neurological: Positive for weakness. Negative for sensory change, speech change and focal weakness.  Psychiatric/Behavioral: Negative for depression and hallucinations. The patient is not nervous/anxious.    Tolerating Diet:yes Tolerating PT:   DRUG ALLERGIES:   Allergies  Allergen Reactions  . Lipitor [Atorvastatin] Other (See Comments)    Leg pains  . Tramadol Other (See Comments)    Mouth feels like it's on fire    VITALS:  Blood pressure (!) 166/90, pulse 74, temperature 98.7 F (37.1 C), temperature source Oral, resp. rate 12, height 5\' 2"  (1.575 m), weight 68.5 kg, SpO2 98 %.  PHYSICAL EXAMINATION:   Physical Exam  GENERAL:  49 y.o.-year-old patient lying in the bed with no acute distress. HEENT: Head atraumatic, normocephalic. Oropharynx and nasopharynx clear. NECK:  Supple, no jugular venous distention. No thyroid enlargement, no tenderness.  LUNGS: Normal breath sounds bilaterally, no wheezing, rales, rhonchi. No use of accessory muscles of respiration.  CARDIOVASCULAR: S1, S2 normal. No murmurs, rubs,  or gallops.  ABDOMEN: Soft, nontender, nondistended. Bowel sounds present. No organomegaly or mass.  EXTREMITIES: No cyanosis, clubbing or edema b/l.    NEUROLOGIC: Cranial nerves II through XII are intact. No focal Motor or sensory deficits b/l.   PSYCHIATRIC:  patient is alert and oriented x 3.  SKIN: No obvious rash, lesion, or ulcer.   LABORATORY PANEL:  CBC Recent Labs  Lab 10/14/19 0137  WBC 9.9  HGB 11.4*  HCT 33.6*  PLT 157    Chemistries  Recent Labs  Lab 10/14/19 0137 10/14/19 0137 10/14/19 0618  NA 133*   < > 133*  K 4.1   < > 4.1  CL 100   < > 102  CO2 23   < > 24  GLUCOSE 123*   < > 103*  BUN 19   < > 19  CREATININE 1.73*   < > 1.56*  CALCIUM 8.7*   < > 8.4*  AST 23  --   --   ALT 24  --   --   ALKPHOS 78  --   --   BILITOT 0.6  --   --    < > = values in this interval not displayed.   Cardiac Enzymes No results for input(s): TROPONINI in the last 168 hours. RADIOLOGY:  No results found. ASSESSMENT AND PLAN:   [name] is a 49 y.o. female with medical history significant of diabetes, coronary artery disease status post multiple PCI's, iron deficiency anemia, history of CVA, hypertension, malignant melanoma and degenerative disc disease who presented with significant hypoglycemia. She has had previous hypoglycemic episode recently at which point she was  found to have elevated insulin and C-peptide levels.  No identifiable source.  She is not on any insulin.  Patient checked her sugar last night it was down into the 40s  #1 Recurrent hypoglycemia: -patient was started on D10 drip.  -Sugars have been stable. Will discontinue IV dextrose. Patient recommended to eat small frequent meals may be every 2 to 3 hours -she has  reactive hypoglycemia suspected due to her gastric bypass surgery. Patient follows up with endocrinologist Dr. Honor Junes. -Will resume her acarbose 25 mg TID per endocrinology recommendation -change sugar check to [facility] and  HSwith PRN check as needed -overall feels at baseline. -MRI abdomen done recently did not show any evidence of insulinoma.  #2 depression anxiety: Resume home regimen.  #3 GERD: Continue with PPIs  #4 hypothyroidism:  on levothyroxine  #5 coronary artery disease: Appears stable at baseline.  Patient missed her appointment yesterday with cardiology.  -Remains in sinus rhythm. No chest pain. -Patient follows with Dr. [name]  #6 chronic systolic CHF: Appears compensated at this point.  Transfer out of ICU.  DVT prophylaxis: Lovenox Code Status: Full Family Communication: No family at bedside Disposition Plan: Home Consults called: None Admission status: Inpatient   Status is: Inpatient  Remains inpatient appropriate because:Persistent severe electrolyte disturbances   Dispo: The patient is from: Home              Anticipated d/c is to: Home              Anticipated d/c date is: 1 day              Patient currently is not medically stable to d/c.  Patient just got off dextrose drip. Will continue to monitor her sugars for one more day. If remains stable discharged her tomorrow with outpatient endocrinology appointment with Dr. Honor Junes at [facility].      TOTAL TIME TAKING CARE OF THIS PATIENT: *30* minutes.  >50% time spent on counselling and coordination of care  Note: This dictation was prepared with Dragon dictation along with smaller phrase technology. Any transcriptional errors that result from this process are unintentional.  Fritzi Mandes M.D    [facility]   CC: Primary care physician; [name], MDPatient ID: [name], female   DOB: 1971-01-23, 49 y.o.   MRN: [id number]

## 2019-10-14 NOTE — Discharge Instructions (Signed)
Heart Healthy, Consistent Carbohydrate Nutrition Therapy   A heart-healthy and consistent carbohydrate diet is recommended to manage heart disease and diabetes. To follow a heart-healthy and consistent carbohydrate diet, . Eat a balanced diet with whole grains, fruits and vegetables, and lean protein sources.  . Choose heart-healthy unsaturated fats. Limit saturated fats, trans fats, and cholesterol intake. Eat more plant-based or vegetarian meals using beans and soy foods for protein.  . Eat whole, unprocessed foods to limit the amount of sodium (salt) you eat.  . Choose a consistent amount of carbohydrate at each meal and snack. Limit refined carbohydrates especially sugar, sweets and sugar-sweetened beverages.  . If you drink alcohol, do so in moderation: one serving per day (women) and two servings per day (men). o One serving is equivalent to 12 ounces beer, 5 ounces wine, or 1.5 ounces distilled spirits  Tips Tips for Choosing Heart-Healthy Fats Choose lean protein and low-fat dairy foods to reduce saturated fat intake. . Saturated fat is usually found in animal-based protein and is associated with certain health risks. Saturated fat is the biggest contributor to raise low-density lipoprotein (LDL) cholesterol levels. Research shows that limiting saturated fat lowers unhealthy cholesterol levels. Eat no more than 7% of your total calories each day from saturated fat. Ask your RDN to help you determine how much saturated fat is right for you. . There are many foods that do not contain large amounts of saturated fats. Swapping these foods to replace foods high in saturated fats will help you limit the saturated fat you eat and improve your cholesterol levels. You can also try eating more plant-based or vegetarian meals. Instead of. Try:  Whole milk, cheese, yogurt, and ice cream 1% or skim milk, low-fat cheese, non-fat yogurt, and low-fat ice cream  Fatty, marbled beef and pork Lean beef, pork,  or venison  Poultry with skin Poultry without skin  Butter, stick margarine Reduced-fat, whipped, or liquid spreads  Coconut oil, palm oil Liquid vegetable oils: corn, canola, olive, soybean and safflower oils   Avoid foods that contain trans fats. . Trans fats increase levels of LDL-cholesterol. Hydrogenated fat in processed foods is the main source of trans fats in foods.  . Trans fats can be found in stick margarine, shortening, processed sweets, baked goods, some fried foods, and packaged foods made with hydrogenated oils. Avoid foods with "partially hydrogenated oil" on the ingredient list such as: cookies, pastries, baked goods, biscuits, crackers, microwave popcorn, and frozen dinners. Choose foods with heart healthy fats. . Polyunsaturated and monounsaturated fat are unsaturated fats that may help lower your blood cholesterol level when used in place of saturated fat in your diet. . Ask your RDN about taking a dietary supplement with plant sterols and stanols to help lower your cholesterol level. Marland Kitchen Research shows that substituting saturated fats with unsaturated fats is beneficial to cholesterol levels. Try these easy swaps: Instead of. Try:  Butter, stick margarine, or solid shortening Reduced-fat, whipped, or liquid spreads  Beef, pork, or poultry with skin Fish and seafood  Chips, crackers, snack foods Raw or unsalted nuts and seeds or nut butters Hummus with vegetables Avocado on toast  Coconut oil, palm oil Liquid vegetable oils: corn, canola, olive, soybean and safflower oils  Limit the amount of cholesterol you eat to less than 200 milligrams per day. . Cholesterol is a substance carried through the bloodstream via lipoproteins, which are known as "transporters" of fat. Some body functions need cholesterol to work properly, but too much  cholesterol in the bloodstream can damage arteries and build up blood vessel linings (which can lead to heart attack and stroke). You should eat  less than 200 milligrams cholesterol per day. Marland Kitchen People respond differently to eating cholesterol. There is no test available right now that can figure out which people will respond more to dietary cholesterol and which will respond less. For individuals with high intake of dietary cholesterol, different types of increase (none, small, moderate, large) in LDL-cholesterol levels are all possible.  . Food sources of cholesterol include egg yolks and organ meats such as liver, gizzards. Limit egg yolks to two to four per week and avoid organ meats like liver and gizzards to control cholesterol intake. Tips for Choosing Heart-Healthy Carbohydrates Consume a consistent amount of carbohydrate . It is important to eat foods with carbohydrates in moderation because they impact your blood glucose level. Carbohydrates can be found in many foods such as: . Grains (breads, crackers, rice, pasta, and cereals)  . Starchy Vegetables (potatoes, corn, and peas)  . Beans and legumes  . Milk, soy milk, and yogurt  . Fruit and fruit juice  . Sweets (cakes, cookies, ice cream, jam and jelly) . Your RDN will help you set a goal for how many carbohydrate servings to eat at your meals and snacks. For many adults, eating 3 to 5 servings of carbohydrate foods at each meal and 1 or 2 carbohydrate servings for each snack works well.  . Check your blood glucose level regularly. It can tell you if you need to adjust when you eat carbohydrates. . Choose foods rich in viscous (soluble) fiber . Viscous, or soluble, is found in the walls of plant cells. Viscous fiber is found only in plant-based foods. Eating foods with fiber helps to lower your unhealthy cholesterol and keep your blood glucose in range  . Rich sources of viscous fiber include vegetables (asparagus, Brussels sprouts, sweet potatoes, turnips) fruit (apricots, mangoes, oranges), legumes, and whole grains (barley, oats, and oat bran).  . As you increase your fiber  intake gradually, also increase the amount of water you drink. This will help prevent constipation.  . If you have difficulty achieving this goal, ask your RDN about fiber laxatives. Choose fiber supplements made with viscous fibers such as psyllium seed husks or methylcellulose to help lower unhealthy cholesterol.  . Limit refined carbohydrates  . There are three types of carbohydrates: starches, sugar, and fiber. Some carbohydrates occur naturally in food, like the starches in rice or corn or the sugars in fruits and milk. Refined carbohydrates--foods with high amounts of simple sugars--can raise triglyceride levels. High triglyceride levels are associated with coronary heart disease. . Some examples of refined carbohydrate foods are table sugar, sweets, and beverages sweetened with added sugar. Tips for Reducing Sodium (Salt) Although sodium is important for your body to function, too much sodium can be harmful for people with high blood pressure. As sodium and fluid buildup in your tissues and bloodstream, your blood pressure increases. High blood pressure may cause damage to other organs and increase your risk for a stroke. Even if you take a pill for blood pressure or a water pill (diuretic) to remove fluid, it is still important to have less salt in your diet. Ask your doctor and RDN what amount of sodium is right for you. Marland Kitchen Avoid processed foods. Eat more fresh foods.  . Fresh fruits and vegetables are naturally low in sodium, as well as frozen vegetables and fruits that have  no added juices or sauces.  . Fresh meats are lower in sodium than processed meats, such as bacon, sausage, and hotdogs. Read the nutrition label or ask your butcher to help you find a fresh meat that is low in sodium. . Eat less salt--at the table and when cooking.  . A single teaspoon of table salt has 2,300 mg of sodium.  . Leave the salt out of recipes for pasta, casseroles, and soups.  . Ask your RDN how to cook your  favorite recipes without sodium . Be a Paramedic.  . Look for food packages that say "salt-free" or "sodium-free." These items contain less than 5 milligrams of sodium per serving.  Marland Kitchen "Very low-sodium" products contain less than 35 milligrams of sodium per serving.  Marland Kitchen "Low-sodium" products contain less than 140 milligrams of sodium per serving.  . Beware for "Unsalted" or "No Added Salt" products. These items may still be high in sodium. Check the nutrition label. . Add flavors to your food without adding sodium.  . Try lemon juice, lime juice, fruit juice or vinegar.  . Dry or fresh herbs add flavor. Try basil, bay leaf, dill, rosemary, parsley, sage, dry mustard, nutmeg, thyme, and paprika.  . Pepper, red pepper flakes, and cayenne pepper can add spice t your meals without adding sodium. Hot sauce contains sodium, but if you use just a drop or two, it will not add up to much.  Sharyn Lull a sodium-free seasoning blend or make your own at home. Additional Lifestyle Tips Achieve and maintain a healthy weight. . Talk with your RDN or your doctor about what is a healthy weight for you. . Set goals to reach and maintain that weight.  . To lose weight, reduce your calorie intake along with increasing your physical activity. A weight loss of 10 to 15 pounds could reduce LDL-cholesterol by 5 milligrams per deciliter. Participate in physical activity. . Talk with your health care team to find out what types of physical activity are best for you. Set a plan to get about 30 minutes of exercise on most days.  Foods Recommended Food Group Foods Recommended  Grains Whole grain breads and cereals, including whole wheat, barley, rye, buckwheat, corn, teff, quinoa, millet, amaranth, brown or wild rice, sorghum, and oats Pasta, especially whole wheat or other whole grain types  [employer], quinoa or wild rice Whole grain crackers, bread, rolls, pitas Home-made bread with reduced-sodium baking soda  Protein  Foods Lean cuts of beef and pork (loin, leg, round, extra lean hamburger)  Skinless Cytogeneticist and other wild game Dried beans and peas Nuts and nut butters Meat alternatives made with soy or textured vegetable protein  Egg whites or egg substitute Cold cuts made with lean meat or soy protein  Dairy Nonfat (skim), low-fat, or 1%-fat milk  Nonfat or low-fat yogurt or cottage cheese Fat-free and low-fat cheese  Vegetables Fresh, frozen, or canned vegetables without added fat or salt   Fruits Fresh, frozen, canned, or dried fruit   Oils Unsaturated oils (corn, olive, peanut, soy, sunflower, canola)  Soft or liquid margarines and vegetable oil spreads  Salad dressings Seeds and nuts  Avocado   Foods Not Recommended Food Group Foods Not Recommended  Grains Breads or crackers topped with salt Cereals (hot or cold) with more than 300 mg sodium per serving Biscuits, cornbread, and other "quick" breads prepared with baking soda Bread crumbs or stuffing mix from a store High-fat bakery products, such as  doughnuts, biscuits, croissants, danish pastries, pies, cookies Instant cooking foods to which you add hot water and stir--potatoes, noodles, rice, etc. Packaged starchy foods--seasoned noodle or rice dishes, stuffing mix, macaroni and cheese dinner Snacks made with partially hydrogenated oils, including chips, cheese puffs, snack mixes, regular crackers, butter-flavored popcorn  Protein Foods Higher-fat cuts of meats (ribs, t-bone steak, regular hamburger) Bacon, sausage, or hot dogs Cold cuts, such as salami or bologna, deli meats, cured meats, corned beef Organ meats (liver, brains, gizzards, sweetbreads) Poultry with skin Fried or smoked meat, poultry, and fish Whole eggs and egg yolks (more than 2-4 per week) Salted legumes, nuts, seeds, or nut/seed butters Meat alternatives with high levels of sodium (>300 mg per serving) or saturated fat (>5 g per serving)  Dairy Whole  milk,?2% fat milk, buttermilk Whole milk yogurt or ice cream Cream Half-&-half Cream cheese Sour cream Cheese  Vegetables Canned or frozen vegetables with salt, fresh vegetables prepared with salt, butter, cheese, or cream sauce Fried vegetables Pickled vegetables such as olives, pickles, or sauerkraut  Fruits Fried fruits Fruits served with butter or cream  Oils Butter, stick margarine, shortening Partially hydrogenated oils or trans fats Tropical oils (coconut, palm, palm kernel oils)  Other Candy, sugar sweetened soft drinks and desserts Salt, sea salt, garlic salt, and seasoning mixes containing salt Bouillon cubes Ketchup, barbecue sauce, Worcestershire sauce, soy sauce, teriyaki sauce Miso Salsa Pickles, olives, relish

## 2019-10-14 NOTE — ED Notes (Signed)
Pt reporting she had intermittent chest pain and SOB form "small blockages" that are known by her Cardiologist but too small to place stents in at this time. Pt reports she has Nitro at home that she takes at times that the pain is unbearable. Pt denies needing Nitro at this time but verbalized being okay with EKG and troponin testing to check CP and SOB.

## 2019-10-14 NOTE — Progress Notes (Signed)
Initial Nutrition Assessment  DOCUMENTATION CODES:   Not applicable  INTERVENTION:  Ensure Enlive po BID, each supplement provides 350 kcal and 20 grams of protein (chocolate)  MVI with minerals daily  DYS 3 diet (chopped meats)  Diet education  Pt reports chronic dysphagia, consider SLP consult for BSE  Pt at high risk for nutrient deficiencies r/t her gastric bypass surgery. Recommend check thiamine, zinc, copper and vitamins D, A, E, & K labs  NUTRITION DIAGNOSIS:   Inadequate oral intake related to poor appetite as evidenced by per patient/family report.   GOAL:   Patient will meet greater than or equal to 90% of their needs   MONITOR:   Labs, I & O's, Supplement acceptance, PO intake, Weight trends  REASON FOR ASSESSMENT:   Consult Assessment of nutrition requirement/status, Diet education  ASSESSMENT:  RD working remotely.  49 year old female with history of DM, CAD s/p multiple PCI's, iron deficiency anemia, CVA, HTN, malignant melanoma, DDD, and history of gastric bypass in 2011 presented with significant hypoglycemia.  Per notes, pt has not taken any diabetic medications in a while and recent  hypoglycemic episode, found to have elevated insulin and C-peptide levels without identifiable source. Reactive hypoglycemia suspected due to gastric bypass, followed by endocrinology.   Able to speak with pt via phone, recalls eating about half of dinner tray (chicken, most of macaroni and some baked beans) She endorses chronic dysphagia with po intake, reports having difficulty with swallowing chicken this evening. States that she has been seen by oupt GI in the past, however no reason for swallowing difficulties were identified. Patient agreeable to mechanically altered DYS 3 diet, consider SLP consult for BSE.    Patient reports she is unable to eat a lot secondary to gastric bypass and eats once/day, usually at lunch time, recalls sandwiches and drinks Pepsi as well  as Premier Protein supplement. Patient agreeable to drinking chocolate Ensure BID. RD educated on the importance of small frequent balanced meals/snacks throughout the day for glucose control and suggested setting an alarm for every 3-4 hours during the day as a reminder to eat. "Heart Healthy Consistent Carbohydrate" handout from [employer] has been attached to discharge instructions.  Per chart, weights have been stable over the last 5 months.  Medications reviewed and include: Acarbose, Coreg, Gabapentin, Imdur, Protonix D10 discontinued  Labs: CBGs 122,35,119,65,118, Na 133 (L), Cr 1.56 (H), Hgb 11.4 (L), HCT 33.6 (L) 09/03/19 A1c 4.8  NUTRITION - FOCUSED PHYSICAL EXAM: Unable to complete at this time, RD working remotely.  Diet Order:   Diet Order            Diet regular Room service appropriate? Yes; Fluid consistency: Thin  Diet effective now                 EDUCATION NEEDS:   Education needs have been addressed  Skin:  Skin Assessment: Reviewed RN Assessment  Last BM:  pta  Height:   Ht Readings from Last 1 Encounters:  10/13/19 5\' 2"  (1.575 m)    Weight:   Wt Readings from Last 1 Encounters:  10/13/19 68.5 kg    Ideal Body Weight:  50 kg  BMI:  Body mass index is 27.62 kg/m.  Estimated Nutritional Needs:   Kcal:  1800-2000  Protein:  90-100  Fluid:  >/- 1.8 L/day   Lajuan Lines, RD, LDN Clinical Nutrition After Hours/Weekend Pager # in [place]

## 2019-10-14 NOTE — Progress Notes (Signed)
Hypoglycemic Event  CBG: 65  Treatment:8 oz juice  Symptoms: None  Follow-up CBG: Time:1255 CBG Result:119  Possible Reasons for Event: Change in activity  Comments/MD notified: Dr. Elissa Lovett

## 2019-10-15 LAB — GLUCOSE, CAPILLARY
Glucose-Capillary: 59 mg/dL — ABNORMAL LOW (ref 70–99)
Glucose-Capillary: 150 mg/dL — ABNORMAL HIGH (ref 70–99)
Glucose-Capillary: 81 mg/dL (ref 70–99)
Glucose-Capillary: 65 mg/dL — ABNORMAL LOW (ref 70–99)
Glucose-Capillary: 196 mg/dL — ABNORMAL HIGH (ref 70–99)
Glucose-Capillary: 183 mg/dL — ABNORMAL HIGH (ref 70–99)
Glucose-Capillary: 149 mg/dL — ABNORMAL HIGH (ref 70–99)
Glucose-Capillary: 166 mg/dL — ABNORMAL HIGH (ref 70–99)
Glucose-Capillary: 117 mg/dL — ABNORMAL HIGH (ref 70–99)

## 2019-10-15 MED ORDER — ASPIRIN-ACETAMINOPHEN-CAFFEINE 250-250-65 MG PO TABS
1.0000 | ORAL_TABLET | Freq: Two times a day (BID) | ORAL | Status: AC | PRN
  Administered 2019-10-15 – 2019-10-17 (×3): 1 via ORAL
  Filled 2019-10-15 (×4): qty 1

## 2019-10-15 NOTE — Progress Notes (Addendum)
PROGRESS NOTE    Taylorrae Qualley  ZOX:[id number]  DOB: 12/20/1970  DOA: 10/13/2019 PCP: [name], MD Outpatient Specialists:   Hospital course:  49 year old female status post gastric bypass is readmitted for persistent hypoglycemia after recent discharge for same on 09/12/2019.  Work-up has revealed elevated C-peptide levels and MRI without any evidence of insulinoma/pancreatic mass.  Patient has been maintained on D10 drip.   Subjective:  Patient states she is very sleepy and tired a lot of the time.  She does not understand why.  Does admit that she does not eat very much at home and notes she is not very hungry.  However notes that she did have chicken and mac & cheese for dinner last night and her sugar was still low this morning.  Notes she has felt dizzy when her sugar was low (65).  Patient also complains of headache which is low-grade dull and persistent.  Notes she gets intermittent headaches at home.  Objective: Vitals:   10/14/19 1606 10/14/19 2014 10/15/19 0059 10/15/19 0741  BP: (!) 156/89 (!) 175/89 (!) 162/74 (!) 156/71  Pulse: 70 71 65 79  Resp: 17 16 16 16   Temp: 98.2 F (36.8 C) 98.3 F (36.8 C) 98.6 F (37 C) 97.6 F (36.4 C)  TempSrc: Oral Oral Oral Oral  SpO2: 99% 100% 100% 98%  Weight:      Height:        Intake/Output Summary (Last 24 hours) at 10/15/2019 1403 Last data filed at 10/15/2019 1358 Gross per 24 hour  Intake 2474.62 ml  Output --  Net 2474.62 ml   Filed Weights   10/13/19 1249  Weight: 68.5 kg     Exam:  General: Obese female lying in bed looking somewhat pale in no acute distress. Eyes: sclera anicteric, conjuctiva mild injection bilaterally CVS: S1-S2, regular  Respiratory:  decreased air entry bilaterally secondary to decreased inspiratory effort, rales at bases  GI: NABS, soft, NT  LE: No edema.  Neuro: A/O x 3, Moving all extremities equally with normal strength, CN 3-12 intact, grossly nonfocal.  Psych:  patient is logical and coherent, judgement and insight appear normal, mood and affect appropriate to situation.   Assessment & Plan:   49 year old female with persistent hyperglycemia after gastric bypass with elevated insulin/C-peptide levels and without evidence of insulinoma.  Persistent hypoglycemia Patient's blood sugar was 65 earlier today on D10 at 125 cc an hour despite eating a reasonable dinner last night.  Patient notes she is symptomatic with this.  D10 drip had been increased to 150 cc an hour and blood sugars now running in the mid to high 100s. Concern is for reactive hypoglycemia given gastric bypass Acarbose 25 TID per endocrinology has been restarted She may well benefit from an extremely low carb diet to decrease the amount of reactive hyperinsulinemia she has.  We will request dietary consult to put patient on very low carb diet to see if she feels better. We will try to taper drip as tolerated.  Mild hyponatremia Likely because patient has been getting D5W, persistent and she still needs to be on a dextrose drip, can change to D10 half-normal  AKI Improving however not back to baseline Avoid nephrotoxic medicines  Headache Patient requesting Toradol, notes Tylenol is not effective We will avoid Toradol given AKI Trial of Excedrin  Depression and anxiety Continue home meds with appropriate on, Xanax, trazodone and venlafaxine  GERD Continue PPI  Hypothyroidism Continue Synthroid  HFrEF  No evidence of decompensation  CAD Patient denies chest pain Continue Plavix, aspirin, Imdur, carvedilol and rosuvastatin   DVT prophylaxis: Lovenox Code Status: Full Family Communication: No family at bedside Disposition Plan:   Patient is from: Home  Anticipated Discharge Location: Home  Barriers to Discharge: Patient still on D10 drip  Is patient medically stable for Discharge: Not  yet   Consultants:  None  Procedures:  None  Antimicrobials:  None   Data Reviewed:  Basic Metabolic Panel: Recent Labs  Lab 10/13/19 1300 10/14/19 0137 10/14/19 0618  NA 138 133* 133*  K 4.8 4.1 4.1  CL 106 100 102  CO2 23 23 24   GLUCOSE 153* 123* 103*  BUN 22* 19 19  CREATININE 1.76* 1.73* 1.56*  CALCIUM 8.8* 8.7* 8.4*   Liver Function Tests: Recent Labs  Lab 10/13/19 1300 10/14/19 0137  AST 28 23  ALT 28 24  ALKPHOS 93 78  BILITOT 0.7 0.6  PROT 7.4 6.5  ALBUMIN 3.8 3.3*   No results for input(s): LIPASE, AMYLASE in the last 168 hours. No results for input(s): AMMONIA in the last 168 hours. CBC: Recent Labs  Lab 10/13/19 1300 10/14/19 0137  WBC 9.2 9.9  NEUTROABS 7.2  --   HGB 13.0 11.4*  HCT 38.7 33.6*  MCV 82.5 82.2  PLT 202 157   Cardiac Enzymes: No results for input(s): CKTOTAL, CKMB, CKMBINDEX, TROPONINI in the last 168 hours. BNP (last 3 results) No results for input(s): PROBNP in the last 8760 hours. CBG: Recent Labs  Lab 10/15/19 0404 10/15/19 0459 10/15/19 0604 10/15/19 0743 10/15/19 1134  GLUCAP 81 65* 196* 183* 149*    Recent Results (from the past 240 hour(s))  SARS Coronavirus 2 by RT PCR (hospital order, performed in [facility] hospital lab) Nasopharyngeal Nasopharyngeal Swab     Status: None   Collection Time: 10/13/19  7:34 PM   Specimen: Nasopharyngeal Swab  Result Value Ref Range Status   SARS Coronavirus 2 NEGATIVE NEGATIVE Final    Comment: (NOTE) SARS-CoV-2 target nucleic acids are NOT DETECTED.  The SARS-CoV-2 RNA is generally detectable in upper and lower respiratory specimens during the acute phase of infection. The lowest concentration of SARS-CoV-2 viral copies this assay can detect is 250 copies / mL. A negative result does not preclude SARS-CoV-2 infection and should not be used as the sole basis for treatment or other patient management decisions.  A negative result may occur with improper  specimen collection / handling, submission of specimen other than nasopharyngeal swab, presence of viral mutation(s) within the areas targeted by this assay, and inadequate number of viral copies (<250 copies / mL). A negative result must be combined with clinical observations, patient history, and epidemiological information.  Fact Sheet for Patients:   BoilerBrush.com.cy  Fact Sheet for Healthcare Providers: https://pope.com/  This test is not yet approved or  cleared by the [place] FDA and has been authorized for detection and/or diagnosis of SARS-CoV-2 by FDA under an Emergency Use Authorization (EUA).  This EUA will remain in effect (meaning this test can be used) for the duration of the COVID-19 declaration under Section 564(b)(1) of the Act, 21 U.S.C. section 360bbb-3(b)(1), unless the authorization is terminated or revoked sooner.  Performed at [facility], [address], [place], [place] [address]   MRSA PCR Screening     Status: Abnormal   Collection Time: 10/14/19  1:08 AM   Specimen: Nasopharyngeal  Result Value Ref Range Status   MRSA  by PCR POSITIVE (A) NEGATIVE Final    Comment:        The GeneXpert MRSA Assay (FDA approved for NASAL specimens only), is one component of a comprehensive MRSA colonization surveillance program. It is not intended to diagnose MRSA infection nor to guide or monitor treatment for MRSA infections. RESULT CALLED TO, READ BACK BY AND VERIFIED WITH: [facility] CAMPBELL RN [phone] 10/14/19 HNM Performed at [facility], [address] [place], [place] [address]       Studies: No results found.   Scheduled Meds: . acarbose  25 mg Oral TID AC  . aspirin EC  81 mg Oral Daily  . buPROPion  300 mg Oral Daily  . carvedilol  6.25 mg Oral BID WC  . Chlorhexidine Gluconate Cloth  6 each Topical Q0600  . clopidogrel  75 mg Oral Daily  . enoxaparin (LOVENOX) injection   40 mg Subcutaneous Q24H  . feeding supplement (ENSURE ENLIVE)  237 mL Oral BID BM  . gabapentin  300 mg Oral BID  . isosorbide mononitrate  90 mg Oral Daily  . levothyroxine  25 mcg Oral QAC breakfast  . multivitamin with minerals  1 tablet Oral Daily  . mupirocin ointment  1 application Nasal BID  . pantoprazole  40 mg Oral BID  . rosuvastatin  5 mg Oral Daily  . venlafaxine XR  150 mg Oral Q breakfast   Continuous Infusions: . dextrose 150 mL/hr at 10/15/19 1314    Principal Problem:   Hypoglycemia Active Problems:   Depression with anxiety   Essential hypertension   GERD (gastroesophageal reflux disease)   Chronic systolic CHF (congestive heart failure) (HCC)   Hypothyroidism   CAD (coronary artery disease)   H/O gastric bypass     Lauren Modisette Tublu Monai Hindes, [facility]  If 7PM-7AM, please contact night-coverage [email or web address] Password [email or web address] 10/15/2019, 2:03 PM    LOS: 2 days

## 2019-10-15 NOTE — Progress Notes (Signed)
Blood glucose of 65. Pt with D10 running at 125. [name] notifed. Ensure given per NP order and D10 increased to 134ml/hr.

## 2019-10-15 NOTE — Progress Notes (Signed)
Report given to [facility]. Sonya to resume care this shift.

## 2019-10-15 NOTE — Progress Notes (Signed)
Pt on D10 at 52ml/hr pt still with a drop in sugar to 59. [name] Notified Np to place orders.

## 2019-10-16 LAB — BASIC METABOLIC PANEL
Anion gap: 8 (ref 5–15)
BUN: 18 mg/dL (ref 6–20)
CO2: 24 mmol/L (ref 22–32)
Calcium: 8.1 mg/dL — ABNORMAL LOW (ref 8.9–10.3)
Chloride: 95 mmol/L — ABNORMAL LOW (ref 98–111)
Creatinine, Ser: 1.37 mg/dL — ABNORMAL HIGH (ref 0.44–1.00)
GFR calc Af Amer: 52 mL/min — ABNORMAL LOW (ref >60)
GFR calc non Af Amer: 45 mL/min — ABNORMAL LOW (ref >60)
Glucose, Bld: 86 mg/dL (ref 70–99)
Potassium: 3.9 mmol/L (ref 3.5–5.1)
Sodium: 127 mmol/L — ABNORMAL LOW (ref 135–145)

## 2019-10-16 LAB — CBC
HCT: 29.6 % — ABNORMAL LOW (ref 36.0–46.0)
Hemoglobin: 10.4 g/dL — ABNORMAL LOW (ref 12.0–15.0)
MCH: 27.7 pg (ref 26.0–34.0)
MCHC: 35.1 g/dL (ref 30.0–36.0)
MCV: 78.7 fL — ABNORMAL LOW (ref 80.0–100.0)
Platelets: 140 10*3/uL — ABNORMAL LOW (ref 150–400)
RBC: 3.76 MIL/uL — ABNORMAL LOW (ref 3.87–5.11)
RDW: 13.4 % (ref 11.5–15.5)
WBC: 7.6 10*3/uL (ref 4.0–10.5)
nRBC: 0 % (ref 0.0–0.2)

## 2019-10-16 LAB — INSULIN AND C-PEPTIDE, SERUM
C-Peptide: 18.7 ng/mL — ABNORMAL HIGH (ref 1.1–4.4)
Insulin: 114 u[IU]/mL — ABNORMAL HIGH (ref 2.6–24.9)

## 2019-10-16 LAB — GLUCOSE, CAPILLARY
Glucose-Capillary: 65 mg/dL — ABNORMAL LOW (ref 70–99)
Glucose-Capillary: 65 mg/dL — ABNORMAL LOW (ref 70–99)
Glucose-Capillary: 74 mg/dL (ref 70–99)
Glucose-Capillary: 85 mg/dL (ref 70–99)
Glucose-Capillary: 257 mg/dL — ABNORMAL HIGH (ref 70–99)
Glucose-Capillary: 118 mg/dL — ABNORMAL HIGH (ref 70–99)
Glucose-Capillary: 75 mg/dL (ref 70–99)

## 2019-10-16 MED ORDER — NUTRISOURCE FIBER PO PACK
1.0000 | PACK | Freq: Three times a day (TID) | ORAL | Status: DC
  Administered 2019-10-17 – 2019-10-20 (×6): 1 via ORAL
  Filled 2019-10-16 (×15): qty 1

## 2019-10-16 MED ORDER — ENSURE MAX PROTEIN PO LIQD
11.0000 [oz_av] | Freq: Two times a day (BID) | ORAL | Status: DC
  Administered 2019-10-16 – 2019-10-20 (×7): 11 [oz_av] via ORAL
  Filled 2019-10-16: qty 330

## 2019-10-16 MED ORDER — SODIUM CHLORIDE 4 MEQ/ML IV SOLN
INTRAVENOUS | Status: DC
  Filled 2019-10-16 (×5): qty 1000

## 2019-10-16 NOTE — Progress Notes (Signed)
Nutrition Follow Up Note   DOCUMENTATION CODES:   Not applicable  INTERVENTION:   Ensure Max protein supplement BID, each supplement provides 150kcal and 30g of protein.  MVI daily   Snacks between meals  Nutrisource fiber with meals  Carbohydrate controlled diet   Pt at high risk for nutrient deficiencies r/t her gastric bypass surgery. Recommend check thiamine, zinc, copper and vitamins D, A, E, & K labs yearly  NUTRITION DIAGNOSIS:   Inadequate oral intake related to poor appetite as evidenced by per patient/family report.  GOAL:   Patient will meet greater than or equal to 90% of their needs -progressing   MONITOR:   Labs, I & O's, Supplement acceptance, PO intake, Weight trends  REASON FOR ASSESSMENT:   Consult Diet education  ASSESSMENT:   49 y.o. female with medical history significant of hypertension, hyperlipidemia, diabetes mellitus, stroke, GERD, hypothyroidism, depression, CAD, DES placement, gastric ulcer, CKD-3, aortic arch aneurysm and Roux-en-y 2011 who is admitted with hypoglycemia  Met with pt in room today. Pt reports fair appetite and oral intake in hospital; pt eating 50-75% of meals and is  drinking Ensure supplements. Pt reports that she has been drinking Premier protein daily and taking her daily vitamins at home. Pt reports eating 2 chicken strips, cheesecake, soda and a sweet potato for lunch today. Pt reports frequent episodes of hypoglycemia accompanied by sweats, nausea and dizziness. Pt also reports lactose intolerance, hair loss and muscle weakness. RD highly suspects pt with hyperinsulinemic hypoglycemia vs severe dumping syndrome r/t her gastric bypass. Pt also reports cholecystectomy several years ago.  RD will change Ensure Enlive over to Ensure Max. RD will change pt over to a carbohydrate controlled diet and add snacks between meals and at bedtime.   RD provided nutrition diet education today regarding dumping syndrome. Recommended 3  meals per day with ~30g of carbohydrate at each meal along with snacks in between meals with ~15-30g of carbohydrate with each snack. Recommended high protein with meals and recommended avoiding "white", high starchy foods and simple sugars. Pt drinking pepsi steadily throughout the day to try and keep her blood sugar up; RD educated patient that this will only worsen her hypoglycemia. Recommend sugar free drinks, water or slightly sweetened tea. Recommended to avoid sugary drinks such a Gatorade, juice, regular soda and super sweet tea. Recommend soluble fiber with meals to help slow digestion; RD will add Nutrisource fiber with meals. RD provided handouts with supporting information. RD also recommended for patient to have her vitamin labs checked with her PCP. Teach back method was used.   Per chart, pt appears weight stable for the past year.   Medications reviewed and include: aspirin, plavix, lovenox, synthroid, MVI, protonix, 10% dextrose '@100ml' /hr  Labs reviewed: Na 127(L), creat 1.37(H) Hgb 10.4(L), Hct 29.6(L)  Nutrition-Focused physical exam completed. Findings are severe fat depletion in arms, mild to moderate muscle depletions in clavicles, hands and legs and no edema.   Diet Order:   Diet Order            Diet Carb Modified Fluid consistency: Thin; Room service appropriate? Yes  Diet effective now                EDUCATION NEEDS:   Education needs have been addressed  Skin:  Skin Assessment: Reviewed RN Assessment  Last BM:  9/12  Height:   Ht Readings from Last 1 Encounters:  10/13/19 '5\' 2"'  (1.575 m)    Weight:   Wt Readings  from Last 1 Encounters:  10/13/19 68.5 kg    Ideal Body Weight:  50 kg  BMI:  Body mass index is 27.62 kg/m.  Estimated Nutritional Needs:   Kcal:  1600-1800kcal/day  Protein:  80-90g/day  Fluid:  1.5-1.8L  Koleen Distance MS, RD, LDN Please refer to [facility] for RD and/or RD on-call/weekend/after hours pager

## 2019-10-16 NOTE — Progress Notes (Signed)
PROGRESS NOTE    [name]  QVZ:[id number]  DOB: 1970/03/26  DOA: 10/13/2019 PCP: [name], MD Outpatient Specialists:   Hospital course:  49 year old female status post gastric bypass is readmitted for persistent hypoglycemia after recent discharge for same on 09/12/2019.  Work-up has revealed elevated C-peptide levels and MRI without any evidence of insulinoma/pancreatic mass.  Patient has been maintained on D10 drip.   Subjective:  Patient states that she feels better and is hoping to get off her D10 drip.  She would like to go home.  I noted that her blood sugar was 65 this morning even on the D10 drip.  Discussed that she may do better on a low carbohydrate diet.  She also states she is going to try to eat more today.  Objective: Vitals:   10/15/19 0741 10/15/19 1531 10/15/19 2341 10/16/19 0733  BP: (!) 156/71 (!) 169/89 (!) 161/78 (!) 154/76  Pulse: 79 78 64 62  Resp: 16 16 18 16   Temp: 97.6 F (36.4 C) 98.2 F (36.8 C) 98.4 F (36.9 C) 98.6 F (37 C)  TempSrc: Oral Oral Oral Oral  SpO2: 98% 98% 98% 100%  Weight:      Height:        Intake/Output Summary (Last 24 hours) at 10/16/2019 1446 Last data filed at 10/15/2019 1800 Gross per 24 hour  Intake 501.62 ml  Output 2 ml  Net 499.62 ml   Filed Weights   10/13/19 1249  Weight: 68.5 kg     Exam:  General: Patient up in the room moving her mostly eaten breakfast tray to the sink area. Eyes: sclera anicteric, conjuctiva mild injection bilaterally CVS: S1-S2, regular  Respiratory:  decreased air entry bilaterally secondary to decreased inspiratory effort, rales at bases  GI: NABS, soft, NT  LE: No edema.  Neuro: A/O x 3, Moving all extremities equally with normal strength, CN 3-12 intact, grossly nonfocal.  Psych: patient is logical and coherent, judgement and insight appear normal, mood and affect appropriate to situation.   Assessment & Plan:   49 year old female with persistent  hyperglycemia after gastric bypass with elevated insulin/C-peptide levels and without evidence of insulinoma.  Recurrent and persistent hypoglycemia status post gastric bypass Patient had another blood sugar of 65 on D10 early this morning as she did yesterday. It may be that she needs to eat more frequently than having a 12-hour fast. Nutrition consult regarding low-carb diet for possible reactive hypoglycemia is pending. Acarbose 25 TIDhas been started per endocrinology Will taper drip as tolerated especially if she is able to eat more today.  Hyponatremia Sodium has continued to drop most likely because of the amount of D10W she has been getting, will change to D10 NS instead.  AKI Continues to improve, however not back to baseline (1.2) Avoid nephrotoxic medicines  Headache Patient requesting Toradol, notes Tylenol is not effective We will avoid Toradol given AKI Trial of Excedrin  Depression and anxiety Continue home meds with bupropion, Xanax, trazodone and venlafaxine  GERD Continue PPI  Hypothyroidism Continue Synthroid  HFrEF No evidence of decompensation  CAD Patient denies chest pain Continue Plavix, aspirin, Imdur, carvedilol and rosuvastatin   DVT prophylaxis: Lovenox Code Status: Full Family Communication: No family at bedside Disposition Plan:   Patient is from: Home  Anticipated Discharge Location: Home  Barriers to Discharge: Patient still on D10 drip  Is patient medically stable for Discharge: Not yet   Consultants:  None  Procedures:  None  Antimicrobials:  None   Data Reviewed:  Basic Metabolic Panel: Recent Labs  Lab 10/13/19 1300 10/14/19 0137 10/14/19 0618 10/16/19 0315  NA 138 133* 133* 127*  K 4.8 4.1 4.1 3.9  CL 106 100 102 95*  CO2 23 23 24 24   GLUCOSE 153* 123* 103* 86  BUN 22* 19 19 18   CREATININE 1.76* 1.73* 1.56* 1.37*  CALCIUM 8.8* 8.7* 8.4* 8.1*   Liver Function Tests: Recent Labs  Lab 10/13/19 1300  10/14/19 0137  AST 28 23  ALT 28 24  ALKPHOS 93 78  BILITOT 0.7 0.6  PROT 7.4 6.5  ALBUMIN 3.8 3.3*   No results for input(s): LIPASE, AMYLASE in the last 168 hours. No results for input(s): AMMONIA in the last 168 hours. CBC: Recent Labs  Lab 10/13/19 1300 10/14/19 0137 10/16/19 0315  WBC 9.2 9.9 7.6  NEUTROABS 7.2  --   --   HGB 13.0 11.4* 10.4*  HCT 38.7 33.6* 29.6*  MCV 82.5 82.2 78.7*  PLT 202 157 140*   Cardiac Enzymes: No results for input(s): CKTOTAL, CKMB, CKMBINDEX, TROPONINI in the last 168 hours. BNP (last 3 results) No results for input(s): PROBNP in the last 8760 hours. CBG: Recent Labs  Lab 10/15/19 2113 10/16/19 0522 10/16/19 0638 10/16/19 0734 10/16/19 1138  GLUCAP 117* 65* 74 85 257*    Recent Results (from the past 240 hour(s))  SARS Coronavirus 2 by RT PCR (hospital order, performed in [facility] hospital lab) Nasopharyngeal Nasopharyngeal Swab     Status: None   Collection Time: 10/13/19  7:34 PM   Specimen: Nasopharyngeal Swab  Result Value Ref Range Status   SARS Coronavirus 2 NEGATIVE NEGATIVE Final    Comment: (NOTE) SARS-CoV-2 target nucleic acids are NOT DETECTED.  The SARS-CoV-2 RNA is generally detectable in upper and lower respiratory specimens during the acute phase of infection. The lowest concentration of SARS-CoV-2 viral copies this assay can detect is 250 copies / mL. A negative result does not preclude SARS-CoV-2 infection and should not be used as the sole basis for treatment or other patient management decisions.  A negative result may occur with improper specimen collection / handling, submission of specimen other than nasopharyngeal swab, presence of viral mutation(s) within the areas targeted by this assay, and inadequate number of viral copies (<250 copies / mL). A negative result must be combined with clinical observations, patient history, and epidemiological information.  Fact Sheet for Patients:     BoilerBrush.com.cy  Fact Sheet for Healthcare Providers: https://pope.com/  This test is not yet approved or  cleared by the [place] FDA and has been authorized for detection and/or diagnosis of SARS-CoV-2 by FDA under an Emergency Use Authorization (EUA).  This EUA will remain in effect (meaning this test can be used) for the duration of the COVID-19 declaration under Section 564(b)(1) of the Act, 21 U.S.C. section 360bbb-3(b)(1), unless the authorization is terminated or revoked sooner.  Performed at [facility], [address] [place], [place] [address]   MRSA PCR Screening     Status: Abnormal   Collection Time: 10/14/19  1:08 AM   Specimen: Nasopharyngeal  Result Value Ref Range Status   MRSA by PCR POSITIVE (A) NEGATIVE Final    Comment:        The GeneXpert MRSA Assay (FDA approved for NASAL specimens only), is one component of a comprehensive MRSA colonization surveillance program. It is not intended to diagnose MRSA infection nor to guide or monitor treatment  for MRSA infections. RESULT CALLED TO, READ BACK BY AND VERIFIED WITH: [facility] CAMPBELL RN [phone] 10/14/19 HNM Performed at [facility], [address], [place], [place] [address]       Studies: No results found.   Scheduled Meds: . acarbose  25 mg Oral TID AC  . aspirin EC  81 mg Oral Daily  . buPROPion  300 mg Oral Daily  . carvedilol  6.25 mg Oral BID WC  . Chlorhexidine Gluconate Cloth  6 each Topical Q0600  . clopidogrel  75 mg Oral Daily  . enoxaparin (LOVENOX) injection  40 mg Subcutaneous Q24H  . feeding supplement (ENSURE ENLIVE)  237 mL Oral BID BM  . gabapentin  300 mg Oral BID  . isosorbide mononitrate  90 mg Oral Daily  . levothyroxine  25 mcg Oral QAC breakfast  . multivitamin with minerals  1 tablet Oral Daily  . mupirocin ointment  1 application Nasal BID  . pantoprazole  40 mg Oral BID  . rosuvastatin  5 mg  Oral Daily  . venlafaxine XR  150 mg Oral Q breakfast   Continuous Infusions: . dextrose 10 % 1,000 mL with sodium chloride 154 mEq infusion 100 mL/hr at 10/16/19 1239    Principal Problem:   Hypoglycemia Active Problems:   Depression with anxiety   Essential hypertension   GERD (gastroesophageal reflux disease)   Chronic systolic CHF (congestive heart failure) (HCC)   Hypothyroidism   CAD (coronary artery disease)   H/O gastric bypass     Ulrich Soules Tublu Brack Shaddock, [facility]  If 7PM-7AM, please contact night-coverage [email or web address] Password [email or web address] 10/16/2019, 2:46 PM    LOS: 3 days

## 2019-10-16 NOTE — Care Management Important Message (Signed)
Important Message  Patient Details  Name: [name] MRN: [id number] Date of Birth: November 02, 1970   Medicare Important Message Given:  Yes  Reviewed with patient via room phone due to isolation status.  Copy of Medicare IM to be given to unit secretary to be brought in next time staff need to enter room.     Dannette Barbara 10/16/2019, 2:56 PM

## 2019-10-16 NOTE — Plan of Care (Signed)
  Problem: Clinical Measurements: Goal: Ability to maintain clinical measurements within normal limits will improve Outcome: Progressing Goal: Will remain free from infection Outcome: Progressing Goal: Diagnostic test results will improve Outcome: Progressing Goal: Respiratory complications will improve Outcome: Progressing Goal: Cardiovascular complication will be avoided Outcome: Progressing   Problem: Pain Managment: Goal: General experience of comfort will improve Outcome: Progressing   Problem: Safety: Goal: Ability to remain free from injury will improve Outcome: Progressing   

## 2019-10-16 NOTE — Progress Notes (Signed)
Pt complained of chest pain. Nitro given X1. Pt voiced relieve. Hassan Rowan  Morrison/NP made aware. To monitor.

## 2019-10-17 ENCOUNTER — Telehealth: Payer: Self-pay | Admitting: Cardiovascular Disease

## 2019-10-17 DIAGNOSIS — I25118 Atherosclerotic heart disease of native coronary artery with other forms of angina pectoris: Secondary | ICD-10-CM

## 2019-10-17 LAB — CBC WITH DIFFERENTIAL/PLATELET
Abs Immature Granulocytes: 0.16 10*3/uL — ABNORMAL HIGH (ref 0.00–0.07)
Basophils Absolute: 0.1 10*3/uL (ref 0.0–0.1)
Basophils Relative: 1 %
Eosinophils Absolute: 0.1 10*3/uL (ref 0.0–0.5)
Eosinophils Relative: 1 %
HCT: 33.2 % — ABNORMAL LOW (ref 36.0–46.0)
Hemoglobin: 11 g/dL — ABNORMAL LOW (ref 12.0–15.0)
Immature Granulocytes: 1 %
Lymphocytes Relative: 5 %
Lymphs Abs: 0.6 10*3/uL — ABNORMAL LOW (ref 0.7–4.0)
MCH: 27.6 pg (ref 26.0–34.0)
MCHC: 33.1 g/dL (ref 30.0–36.0)
MCV: 83.4 fL (ref 80.0–100.0)
Monocytes Absolute: 1.2 10*3/uL — ABNORMAL HIGH (ref 0.1–1.0)
Monocytes Relative: 10 %
Neutro Abs: 10.3 10*3/uL — ABNORMAL HIGH (ref 1.7–7.7)
Neutrophils Relative %: 82 %
Platelets: 134 10*3/uL — ABNORMAL LOW (ref 150–400)
RBC: 3.98 MIL/uL (ref 3.87–5.11)
RDW: 13.4 % (ref 11.5–15.5)
WBC: 12.5 10*3/uL — ABNORMAL HIGH (ref 4.0–10.5)
nRBC: 0 % (ref 0.0–0.2)

## 2019-10-17 LAB — GLUCOSE, CAPILLARY
Glucose-Capillary: 94 mg/dL (ref 70–99)
Glucose-Capillary: 91 mg/dL (ref 70–99)
Glucose-Capillary: 174 mg/dL — ABNORMAL HIGH (ref 70–99)
Glucose-Capillary: 103 mg/dL — ABNORMAL HIGH (ref 70–99)
Glucose-Capillary: 150 mg/dL — ABNORMAL HIGH (ref 70–99)

## 2019-10-17 LAB — COMPREHENSIVE METABOLIC PANEL
ALT: 22 U/L (ref 0–44)
AST: 26 U/L (ref 15–41)
Albumin: 3.1 g/dL — ABNORMAL LOW (ref 3.5–5.0)
Alkaline Phosphatase: 78 U/L (ref 38–126)
Anion gap: 8 (ref 5–15)
BUN: 18 mg/dL (ref 6–20)
CO2: 24 mmol/L (ref 22–32)
Calcium: 8.3 mg/dL — ABNORMAL LOW (ref 8.9–10.3)
Chloride: 96 mmol/L — ABNORMAL LOW (ref 98–111)
Creatinine, Ser: 1.51 mg/dL — ABNORMAL HIGH (ref 0.44–1.00)
GFR calc Af Amer: 46 mL/min — ABNORMAL LOW (ref >60)
GFR calc non Af Amer: 40 mL/min — ABNORMAL LOW (ref >60)
Glucose, Bld: 135 mg/dL — ABNORMAL HIGH (ref 70–99)
Potassium: 5 mmol/L (ref 3.5–5.1)
Sodium: 128 mmol/L — ABNORMAL LOW (ref 135–145)
Total Bilirubin: 0.7 mg/dL (ref 0.3–1.2)
Total Protein: 6.4 g/dL — ABNORMAL LOW (ref 6.5–8.1)

## 2019-10-17 LAB — BASIC METABOLIC PANEL
Anion gap: 9 (ref 5–15)
BUN: 19 mg/dL (ref 6–20)
CO2: 25 mmol/L (ref 22–32)
Calcium: 8.7 mg/dL — ABNORMAL LOW (ref 8.9–10.3)
Chloride: 104 mmol/L (ref 98–111)
Creatinine, Ser: 1.5 mg/dL — ABNORMAL HIGH (ref 0.44–1.00)
GFR calc Af Amer: 47 mL/min — ABNORMAL LOW (ref >60)
GFR calc non Af Amer: 40 mL/min — ABNORMAL LOW (ref >60)
Glucose, Bld: 85 mg/dL (ref 70–99)
Potassium: 4.5 mmol/L (ref 3.5–5.1)
Sodium: 138 mmol/L (ref 135–145)

## 2019-10-17 LAB — TROPONIN I (HIGH SENSITIVITY)
Troponin I (High Sensitivity): 10 ng/L (ref <18)
Troponin I (High Sensitivity): 13 ng/L (ref <18)
Troponin I (High Sensitivity): 13 ng/L (ref <18)

## 2019-10-17 LAB — MAGNESIUM: Magnesium: 1.4 mg/dL — ABNORMAL LOW (ref 1.7–2.4)

## 2019-10-17 LAB — LACTIC ACID, PLASMA: Lactic Acid, Venous: 1.3 mmol/L (ref 0.5–1.9)

## 2019-10-17 MED ORDER — CARVEDILOL 12.5 MG PO TABS
12.5000 mg | ORAL_TABLET | Freq: Two times a day (BID) | ORAL | Status: DC
  Administered 2019-10-17 – 2019-10-20 (×6): 12.5 mg via ORAL
  Filled 2019-10-17 (×6): qty 1

## 2019-10-17 MED ORDER — LABETALOL HCL 5 MG/ML IV SOLN: 10.0000 mg | INTRAVENOUS | Status: DC | PRN

## 2019-10-17 MED ORDER — MAGNESIUM SULFATE 4 GM/100ML IV SOLN
4.0000 g | Freq: Once | INTRAVENOUS | Status: AC
  Administered 2019-10-17: 4 g via INTRAVENOUS
  Filled 2019-10-17: qty 100

## 2019-10-17 MED ORDER — ACETAMINOPHEN 325 MG PO TABS
650.0000 mg | ORAL_TABLET | ORAL | Status: DC | PRN
  Administered 2019-10-17 – 2019-10-19 (×5): 650 mg via ORAL
  Filled 2019-10-17 (×5): qty 2

## 2019-10-17 MED ORDER — ISOSORBIDE MONONITRATE ER 30 MG PO TB24
120.0000 mg | ORAL_TABLET | Freq: Every day | ORAL | Status: DC
  Administered 2019-10-19 – 2019-10-20 (×2): 120 mg via ORAL
  Filled 2019-10-17 (×3): qty 4

## 2019-10-17 MED ORDER — SIMETHICONE 80 MG PO CHEW
160.0000 mg | CHEWABLE_TABLET | Freq: Once | ORAL | Status: AC
  Administered 2019-10-17: 160 mg via ORAL
  Filled 2019-10-17: qty 2

## 2019-10-17 MED ORDER — NITROGLYCERIN 2 % TD OINT: 1.0000 [in_us] | TOPICAL_OINTMENT | Freq: Four times a day (QID) | TRANSDERMAL | Status: DC

## 2019-10-17 MED ORDER — MORPHINE SULFATE (PF) 2 MG/ML IV SOLN
0.5000 mg | Freq: Once | INTRAVENOUS | Status: AC
  Administered 2019-10-17: 0.5 mg via INTRAVENOUS
  Filled 2019-10-17: qty 1

## 2019-10-17 MED ORDER — NITROGLYCERIN 2 % TD OINT
1.0000 [in_us] | TOPICAL_OINTMENT | Freq: Four times a day (QID) | TRANSDERMAL | Status: AC
  Administered 2019-10-17: 1 [in_us] via TOPICAL
  Filled 2019-10-17: qty 1

## 2019-10-17 MED ORDER — HYDRALAZINE HCL 25 MG PO TABS
25.0000 mg | ORAL_TABLET | ORAL | Status: DC | PRN
  Administered 2019-10-17: 25 mg via ORAL
  Filled 2019-10-17: qty 1

## 2019-10-17 NOTE — Hospital Course (Addendum)
[name] is a 49 yo CF with PMH CVA, HTN, PUD, DMII, CAD, gastric bypass who presented with persistent hypoglycemia after recent hospitalization on 09/12/19 for similar. She was again found to have elevated levels of C-peptide and previous MRI abdomen was negative for pancreatic pathology. She was placed on a D10 infusion and acarbose was resumed per endocrinology.  Glucose level stabilized and she was able to come off of dextrose infusion and diet was resumed/advanced as tolerated.  She also experienced intermittent CP during hospitalization. She had no ischemic changes noted on EKG and trops were trended intermittently and remained negative. She was previously evaluated by cardiology last hospitalization as well.  She does have underlying disease that was not considered amenable to PCI and she was recommended for ongoing medical management. Due to her ongoing chest pain, cardiology was consulted once again per patient request as well.  She was continued on medical management with increase in her Coreg and Imdur.  She also developed a fever on 9/14 of unknown etiology.  She was tested on admission for COVID-19 and was negative and underwent repeat testing again on 10/18/2019 when her fever continued.  She had been started on empiric Rocephin and doxycycline.  A CXR was obtained also with onset of her fevers which was unremarkable.  Urinalysis did reveal some leukocyte esterase with few bacteria.  She was continued on empiric antibiotics while awaiting further culture results. Her fevers defervesced and no obvious source had revealed itself.  WBC also continued to downtrend.  She remained overall asymptomatic and clinically improved with further monitoring.  She was continued on an empiric course of Augmentin to complete at discharge.

## 2019-10-17 NOTE — Assessment & Plan Note (Signed)
-   see CAD - will adjust as necessary

## 2019-10-17 NOTE — Progress Notes (Signed)
Pt temp 100.7. [name] made aware via text page. Will cont to monitor the pt.

## 2019-10-17 NOTE — Assessment & Plan Note (Signed)
-  Continue bupropion, Xanax, trazodone, venlafaxine

## 2019-10-17 NOTE — Assessment & Plan Note (Signed)
Continue PPI ?

## 2019-10-17 NOTE — Progress Notes (Signed)
PROGRESS NOTE    [name]   LXB:[id number]  DOB: 03-Jul-1970  DOA: 10/13/2019     4  PCP: [name], MD  CC: Low glucose  Hospital Course: [name] is a 49 yo CF with PMH CVA, HTN, PUD, DMII, CAD, gastric bypass who presented with persistent hypoglycemia after recent hospitalization on 09/12/19 for similar. She was again found to have elevated levels of C-peptide and previous MRI abdomen was negative for pancreatic pathology. She was placed on a D10 infusion and acarbose was resumed per endocrinology.   She also experienced intermittent CP during hospitalization. She had no ischemic changes noted on EKG and trops were trended intermittently.  She was previously evaluated by cardiology last hospitalization as well.  She does have underlying disease that was not considered amenable to PCI and she was recommended for ongoing medical management.    Interval History:  Patient seen this morning.  Still having intermittent chest pain.  Denies radiation of pain and denies any nausea/vomiting.  Appetite has been slowly improving some.  Old records reviewed in assessment of this patient  ROS: Constitutional: negative for anorexia, chills and fevers, Respiratory: negative for cough, Cardiovascular: positive for chest pain and Gastrointestinal: negative for abdominal pain  Assessment & Plan: CAD (coronary artery disease) - followed by cardiology; seen 09/11/19 during hospitalization; last [facility] 04/2019 revealed patent stents in LAD and diagonal with stable severe ostial to prox D2 disease (unchanged from [facility] in 2019); not amenable to stenting per cardiology. Distal LAD also small and tapering not considered amenable to PCI as well - patient has declined Ranexa in the past; previously imdur was increased to 90 mg with plans for further uptitration to 120 mg if ongoing CP and BP able to sustain - given ongoing CP, will increase Imdur to 120 mg; place NTG paste for now and d/c  once Imdur change is made to avoid risk of hypoTN - increase coreg to 12.5 mg BIDWC - STAT trop this evening and repeat EKG (already performed 1 EKG this am which was negative for acute ischemia)  Chronic systolic CHF (congestive heart failure) (HCC) - stable; no s/s exacerbation/decompensation   H/O gastric bypass - patient has persistent hypoglycemia s/p gastric bypass - d/c dextrose infusion and monitor; CBGs have been stable - continue acarbose  - encourage diet  Depression with anxiety -Continue bupropion, Xanax, trazodone, venlafaxine  Essential hypertension - see CAD - will adjust as necessary  GERD (gastroesophageal reflux disease) -Continue PPI  Hypoglycemia -See gastric bypass -Discontinue dextrose infusion and monitor -Continue acarbose  Hypothyroidism -Continue Synthroid   Antimicrobials: None  DVT prophylaxis: Lovenox Code Status: Full Family Communication: None present Disposition Plan: Status is: Inpatient  Remains inpatient appropriate because:Unsafe d/c plan, IV treatments appropriate due to intensity of illness or inability to take PO and Inpatient level of care appropriate due to severity of illness   Dispo: The patient is from: Home              Anticipated d/c is to: Home              Anticipated d/c date is: 2 days              Patient currently is not medically stable to d/c.       Objective: Blood pressure (!) 160/93, pulse 100, temperature 99.4 F (37.4 C), temperature source Oral, resp. rate 18, height 5\' 2"  (1.575 m), weight 68.5 kg, SpO2 100 %.  Examination:  General appearance: alert, cooperative and no distress Head: Normocephalic, without obvious abnormality, atraumatic Eyes: EOMI Lungs: clear to auscultation bilaterally Heart: regular rate and rhythm and S1, S2 normal Abdomen: normal findings: bowel sounds normal and soft, non-tender Extremities: No edema Skin: mobility and turgor normal Neurologic: Grossly  normal  Consultants:   None  Procedures:   None  Data Reviewed: I have personally reviewed following labs and imaging studies Results for orders placed or performed during the hospital encounter of 10/13/19 (from the past 24 hour(s))  Glucose, capillary     Status: None   Collection Time: 10/16/19 10:25 PM  Result Value Ref Range   Glucose-Capillary 75 70 - 99 mg/dL  Glucose, capillary     Status: None   Collection Time: 10/17/19  2:15 AM  Result Value Ref Range   Glucose-Capillary 94 70 - 99 mg/dL  Basic metabolic panel     Status: Abnormal   Collection Time: 10/17/19  6:30 AM  Result Value Ref Range   Sodium 138 135 - 145 mmol/L   Potassium 4.5 3.5 - 5.1 mmol/L   Chloride 104 98 - 111 mmol/L   CO2 25 22 - 32 mmol/L   Glucose, Bld 85 70 - 99 mg/dL   BUN 19 6 - 20 mg/dL   Creatinine, Ser 1.50 (H) 0.44 - 1.00 mg/dL   Calcium 8.7 (L) 8.9 - 10.3 mg/dL   GFR calc non Af Amer 40 (L) >60 mL/min   GFR calc Af Amer 47 (L) >60 mL/min   Anion gap 9 5 - 15  Glucose, capillary     Status: None   Collection Time: 10/17/19  7:45 AM  Result Value Ref Range   Glucose-Capillary 91 70 - 99 mg/dL  Glucose, capillary     Status: Abnormal   Collection Time: 10/17/19 11:24 AM  Result Value Ref Range   Glucose-Capillary 174 (H) 70 - 99 mg/dL  Glucose, capillary     Status: Abnormal   Collection Time: 10/17/19  4:05 PM  Result Value Ref Range   Glucose-Capillary 103 (H) 70 - 99 mg/dL   *Note: Due to a large number of results and/or encounters for the requested time period, some results have not been displayed. A complete set of results can be found in Results Review.    Recent Results (from the past 240 hour(s))  SARS Coronavirus 2 by RT PCR (hospital order, performed in [facility] hospital lab) Nasopharyngeal Nasopharyngeal Swab     Status: None   Collection Time: 10/13/19  7:34 PM   Specimen: Nasopharyngeal Swab  Result Value Ref Range Status   SARS Coronavirus 2 NEGATIVE NEGATIVE  Final    Comment: (NOTE) SARS-CoV-2 target nucleic acids are NOT DETECTED.  The SARS-CoV-2 RNA is generally detectable in upper and lower respiratory specimens during the acute phase of infection. The lowest concentration of SARS-CoV-2 viral copies this assay can detect is 250 copies / mL. A negative result does not preclude SARS-CoV-2 infection and should not be used as the sole basis for treatment or other patient management decisions.  A negative result may occur with improper specimen collection / handling, submission of specimen other than nasopharyngeal swab, presence of viral mutation(s) within the areas targeted by this assay, and inadequate number of viral copies (<250 copies / mL). A negative result must be combined with clinical observations, patient history, and epidemiological information.  Fact Sheet for Patients:   StrictlyIdeas.no  Fact Sheet for Healthcare Providers: BankingDealers.co.za  This test is not  yet approved or  cleared by the [place] and has been authorized for detection and/or diagnosis of SARS-CoV-2 by FDA under an Emergency Use Authorization (EUA).  This EUA will remain in effect (meaning this test can be used) for the duration of the COVID-19 declaration under Section 564(b)(1) of the Act, 21 U.S.C. section 360bbb-3(b)(1), unless the authorization is terminated or revoked sooner.  Performed at [facility], [address] [place], [place] [address]   MRSA PCR Screening     Status: Abnormal   Collection Time: 10/14/19  1:08 AM   Specimen: Nasopharyngeal  Result Value Ref Range Status   MRSA by PCR POSITIVE (A) NEGATIVE Final    Comment:        The GeneXpert MRSA Assay (FDA approved for NASAL specimens only), is one component of a comprehensive MRSA colonization surveillance program. It is not intended to diagnose MRSA infection nor to guide or monitor treatment for MRSA  infections. RESULT CALLED TO, READ BACK BY AND VERIFIED WITH: [facility] CAMPBELL RN [phone] 10/14/19 HNM Performed at [facility], [address] [place], [place] [address]      Radiology Studies: No results found. No orders to display    Scheduled Meds: . acarbose  25 mg Oral TID AC  . aspirin EC  81 mg Oral Daily  . buPROPion  300 mg Oral Daily  . carvedilol  12.5 mg Oral BID WC  . Chlorhexidine Gluconate Cloth  6 each Topical Q0600  . clopidogrel  75 mg Oral Daily  . enoxaparin (LOVENOX) injection  40 mg Subcutaneous Q24H  . fiber  1 packet Oral TID  . gabapentin  300 mg Oral BID  . [START ON 10/18/2019] isosorbide mononitrate  120 mg Oral Daily  . levothyroxine  25 mcg Oral QAC breakfast  . multivitamin with minerals  1 tablet Oral Daily  . mupirocin ointment  1 application Nasal BID  . [START ON 10/18/2019] nitroGLYCERIN  1 inch Topical Q6H  . pantoprazole  40 mg Oral BID  . Ensure Max Protein  11 oz Oral BID  . rosuvastatin  5 mg Oral Daily  . venlafaxine XR  150 mg Oral Q breakfast   PRN Meds: acetaminophen **OR** acetaminophen, ALPRAZolam, ALPRAZolam, hydrALAZINE, labetalol, nitroGLYCERIN, ondansetron **OR** ondansetron (ZOFRAN) IV, promethazine, traZODone Continuous Infusions:    LOS: 4 days  Time spent: Greater than 50% of the 35 minute visit was spent in counseling/coordination of care for the patient as laid out in the A&P.   Dwyane Dee, MD [facility] 10/17/2019, 5:10 PM  Contact via secure chat.  To contact the attending provider between 7A-7P or the covering provider during after hours 7P-7A, please log into the web site [email or web address] and access using universal [place] password for that web site. If you do not have the password, please call the hospital operator.

## 2019-10-17 NOTE — Plan of Care (Signed)
  Problem: Clinical Measurements: Goal: Ability to maintain clinical measurements within normal limits will improve Outcome: Progressing Goal: Will remain free from infection Outcome: Progressing Goal: Diagnostic test results will improve Outcome: Progressing Goal: Respiratory complications will improve Outcome: Progressing Goal: Cardiovascular complication will be avoided Outcome: Progressing   Problem: Pain Managment: Goal: General experience of comfort will improve Outcome: Progressing   Problem: Safety: Goal: Ability to remain free from injury will improve Outcome: Progressing  PT BG IS MORE STABLE TO DAY - PT HAD EPISODE OF CP X 1 EVENT THAT REQUIRED NTG X 2 DOSES AND NO ADDITIONAL CP NOTED.  PT STATES SHE HAS HAD CP AT HOME FROM TIME TO TIME AND IS PENDING A CARDIAC PROCEDURE WITH HER CARDS MD

## 2019-10-17 NOTE — Assessment & Plan Note (Signed)
-   stable; no s/s exacerbation/decompensation

## 2019-10-17 NOTE — Telephone Encounter (Signed)
Patient currently admitted to [facility] due to sugar issues, and is still having bad chest pain. Patient states nothing is being done about this and is being told she may be discharge her today  Please advise

## 2019-10-17 NOTE — Assessment & Plan Note (Addendum)
-   patient has persistent hypoglycemia s/p gastric bypass - d/c dextrose infusion and monitor; CBGs have been stable - continue acarbose  - encourage diet

## 2019-10-17 NOTE — Assessment & Plan Note (Addendum)
-   followed by cardiology; seen 09/11/19 during hospitalization; last [facility] 04/2019 revealed patent stents in LAD and diagonal with stable severe ostial to prox D2 disease (unchanged from [facility] in 2019); not amenable to stenting per cardiology. Distal LAD also small and tapering not considered amenable to PCI as well - patient has declined Ranexa in the past; previously imdur was increased to 90 mg with plans for further uptitration to 120 mg if ongoing CP and BP able to sustain - given ongoing CP, will increase Imdur to 120 mg - d/c nitropaste as patient hypotensive some - increase coreg to 12.5 mg BIDWC - hold BP meds if SBP<90 or HR<60 - cardiology consulted given ongoing CP (10/10 on 9/14); agree with med changes; greatly appreciate assistance -Overall, patient has tolerated medication changes and chest pain has been well controlled over the last 24 to 48 hours.  She will follow up outpatient with cardiology -Prescriptions sent to her pharmacy including refills of sublingual nitroglycerin

## 2019-10-17 NOTE — Assessment & Plan Note (Addendum)
-  See gastric bypass -Discontinue dextrose infusion and monitor -Continue acarbose -Diet liberalized on 10/19/2019

## 2019-10-17 NOTE — Progress Notes (Addendum)
Pt c/o CP this am - L sided  radiating to shoulder - no diaphoresis nausea or other symptoms  - NTG given x 2 with relief - EKG completed  MD nnotified via chat

## 2019-10-17 NOTE — Telephone Encounter (Signed)
I spoke with the patient. She states she was recently admitted and is still currently admitted with blood sugar issues. She states she has been getting hypoglycemic and passed out prior to this admission due to a low blood sugar.   She called today stating she was told she would be going home today, but she is refusing to go as her IV with "sugar water" has just been stopped today and her sugars typically drop at night. She wants to be observed over night without IV fluids.  She also states that she has had chest pain that has been worsening over the last 2 weeks.  She was given 2 NTG this morning on the floor which helped to relieve some of her pain. She reported to the nurse about an hour ago that she was having ongoing chest pain, and she was given a xanax as they thought her pain may be coming from anxiety.   She states the Xanax did not help at all.  She is currently still having 9.5/ 10 chest pain that is radiating from her left side to her back and shoulders.   I have advised the patient that unless the hospitalist calls for a cardiology consult, that we would not be coming to see her. I have asked her to call her nurse back in the room and update her of her chest pain statuts. I have also asked her to speak with the nurse to see if a cardiology consult has been placed.  The patient states that no endocrinologist has been in to see her either.   The patient is aware I will review with Dr. [name], but I am not sure at this point that we an do much without the consult being requested.  I advised I will call back with any updates. The patient voices understanding and is agreeable.

## 2019-10-17 NOTE — Assessment & Plan Note (Signed)
Continue Synthroid °

## 2019-10-17 NOTE — Progress Notes (Signed)
Pt complaining of chest pain. Nitro given X1 with relieve. Pt requesting for morphine and wants to be seen by cardiologist. Temp 101.3 but refusing tylenol. [name] notified of the above via text page. Will continue to monitor the patient.

## 2019-10-18 ENCOUNTER — Inpatient Hospital Stay: Payer: Medicare Other

## 2019-10-18 DIAGNOSIS — I10 Essential (primary) hypertension: Secondary | ICD-10-CM

## 2019-10-18 DIAGNOSIS — I2510 Atherosclerotic heart disease of native coronary artery without angina pectoris: Secondary | ICD-10-CM

## 2019-10-18 DIAGNOSIS — R509 Fever, unspecified: Secondary | ICD-10-CM

## 2019-10-18 LAB — BASIC METABOLIC PANEL
Anion gap: 12 (ref 5–15)
BUN: 25 mg/dL — ABNORMAL HIGH (ref 6–20)
CO2: 24 mmol/L (ref 22–32)
Calcium: 9 mg/dL (ref 8.9–10.3)
Chloride: 94 mmol/L — ABNORMAL LOW (ref 98–111)
Creatinine, Ser: 1.7 mg/dL — ABNORMAL HIGH (ref 0.44–1.00)
GFR calc Af Amer: 40 mL/min — ABNORMAL LOW (ref >60)
GFR calc non Af Amer: 35 mL/min — ABNORMAL LOW (ref >60)
Glucose, Bld: 92 mg/dL (ref 70–99)
Potassium: 4.5 mmol/L (ref 3.5–5.1)
Sodium: 130 mmol/L — ABNORMAL LOW (ref 135–145)

## 2019-10-18 LAB — GLUCOSE, CAPILLARY
Glucose-Capillary: 91 mg/dL (ref 70–99)
Glucose-Capillary: 97 mg/dL (ref 70–99)
Glucose-Capillary: 98 mg/dL (ref 70–99)
Glucose-Capillary: 100 mg/dL — ABNORMAL HIGH (ref 70–99)
Glucose-Capillary: 89 mg/dL (ref 70–99)

## 2019-10-18 LAB — CBC WITH DIFFERENTIAL/PLATELET
Abs Immature Granulocytes: 0.17 10*3/uL — ABNORMAL HIGH (ref 0.00–0.07)
Basophils Absolute: 0.1 10*3/uL (ref 0.0–0.1)
Basophils Relative: 1 %
Eosinophils Absolute: 0 10*3/uL (ref 0.0–0.5)
Eosinophils Relative: 0 %
HCT: 36.2 % (ref 36.0–46.0)
Hemoglobin: 12 g/dL (ref 12.0–15.0)
Immature Granulocytes: 1 %
Lymphocytes Relative: 5 %
Lymphs Abs: 0.9 10*3/uL (ref 0.7–4.0)
MCH: 27.7 pg (ref 26.0–34.0)
MCHC: 33.1 g/dL (ref 30.0–36.0)
MCV: 83.6 fL (ref 80.0–100.0)
Monocytes Absolute: 1.8 10*3/uL — ABNORMAL HIGH (ref 0.1–1.0)
Monocytes Relative: 9 %
Neutro Abs: 17.5 10*3/uL — ABNORMAL HIGH (ref 1.7–7.7)
Neutrophils Relative %: 84 %
Platelets: 163 10*3/uL (ref 150–400)
RBC: 4.33 MIL/uL (ref 3.87–5.11)
RDW: 13.5 % (ref 11.5–15.5)
WBC: 20.5 10*3/uL — ABNORMAL HIGH (ref 4.0–10.5)
nRBC: 0 % (ref 0.0–0.2)

## 2019-10-18 LAB — BRAIN NATRIURETIC PEPTIDE: B Natriuretic Peptide: 130.3 pg/mL — ABNORMAL HIGH (ref 0.0–100.0)

## 2019-10-18 LAB — URINALYSIS, COMPLETE (UACMP) WITH MICROSCOPIC
Bilirubin Urine: NEGATIVE
Glucose, UA: NEGATIVE mg/dL
Hgb urine dipstick: NEGATIVE
Ketones, ur: NEGATIVE mg/dL
Nitrite: NEGATIVE
Protein, ur: NEGATIVE mg/dL
Specific Gravity, Urine: 1.013 (ref 1.005–1.030)
Squamous Epithelial / HPF: NONE SEEN (ref 0–5)
pH: 5 (ref 5.0–8.0)

## 2019-10-18 LAB — MAGNESIUM: Magnesium: 2.6 mg/dL — ABNORMAL HIGH (ref 1.7–2.4)

## 2019-10-18 LAB — TSH: TSH: 1.544 u[IU]/mL (ref 0.350–4.500)

## 2019-10-18 LAB — FIBRIN DERIVATIVES D-DIMER (ARMC ONLY): Fibrin derivatives D-dimer (ARMC): 429.22 ng/mL (FEU) (ref 0.00–499.00)

## 2019-10-18 LAB — PROCALCITONIN: Procalcitonin: 0.28 ng/mL

## 2019-10-18 LAB — SARS CORONAVIRUS 2 BY RT PCR (HOSPITAL ORDER, PERFORMED IN ~~LOC~~ HOSPITAL LAB): SARS Coronavirus 2: NEGATIVE

## 2019-10-18 IMAGING — CR DG THORACIC SPINE 2V
3 series · 3 of 3 positions shown · non-contrast
Comparison: 03/18/2017.  CT chest 05/31/2015

CLINICAL DATA: Fell from porch 1 week ago. Persistent thoracic back
pain.

EXAM:
THORACIC SPINE 2 VIEWS

[t-spine ap]
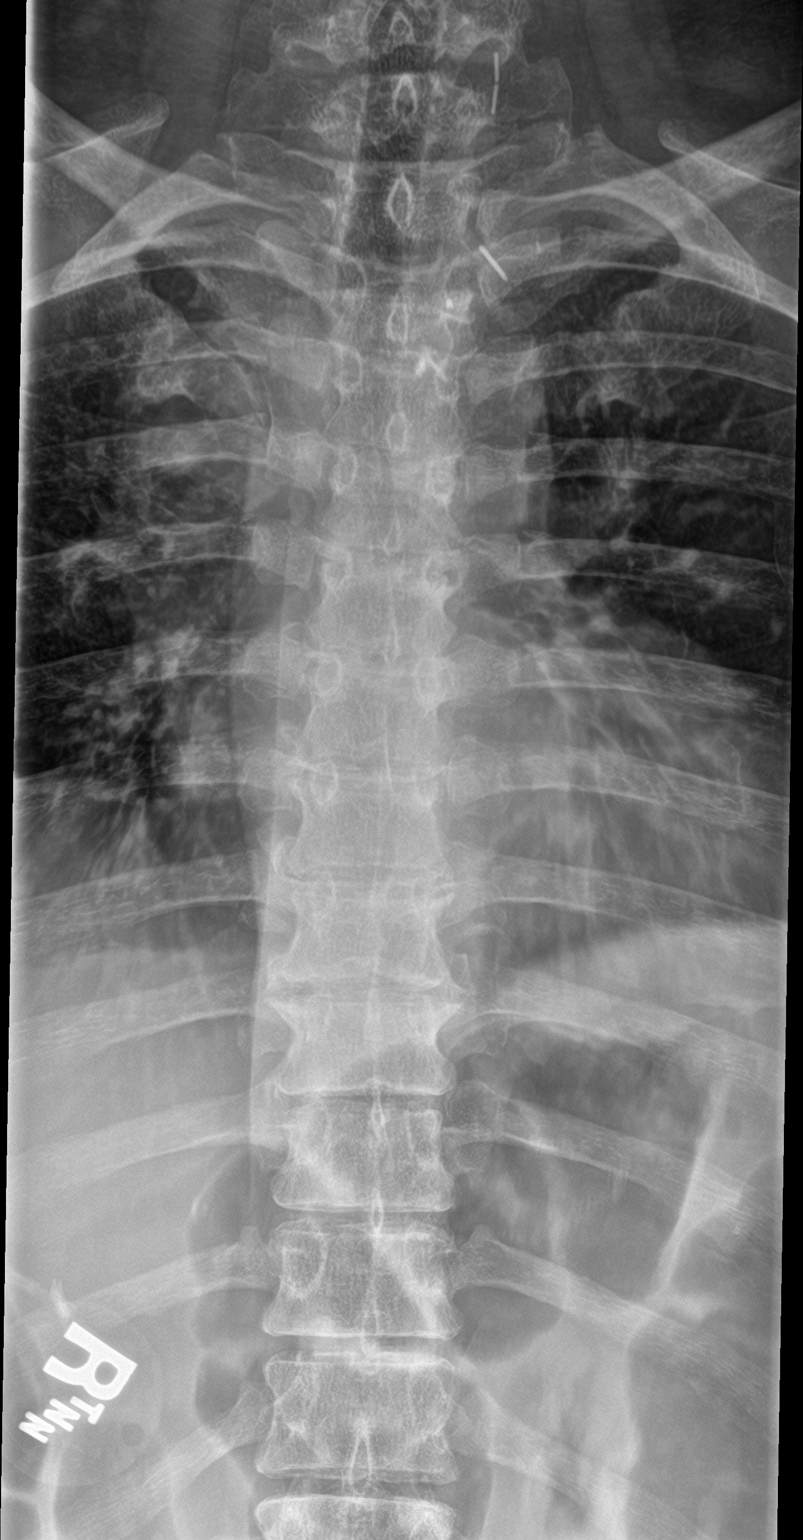

[t-spine lat]
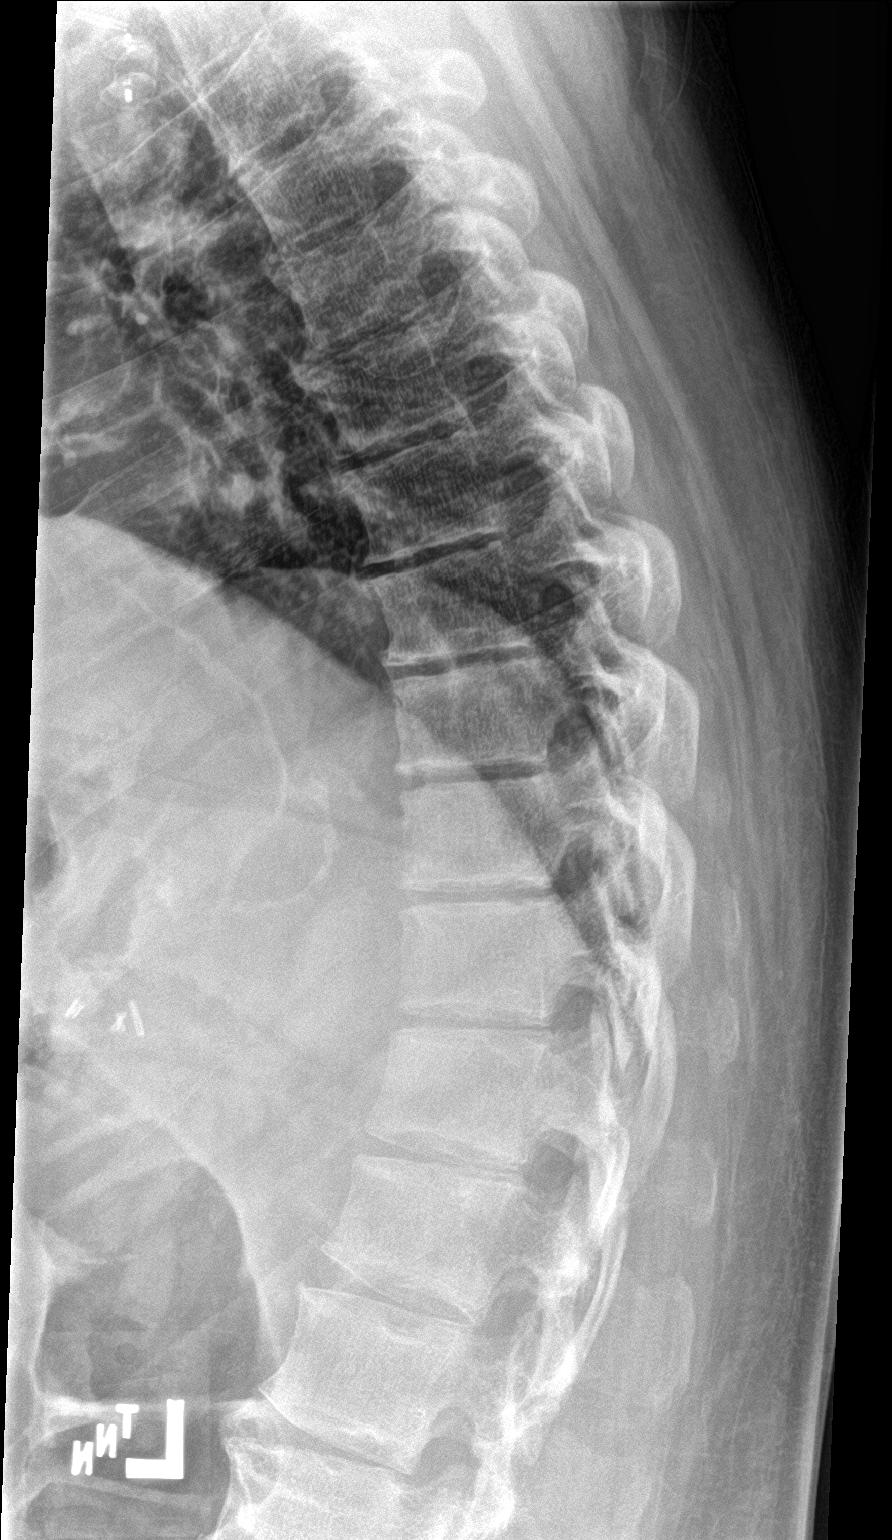

[t-spine swimmers]
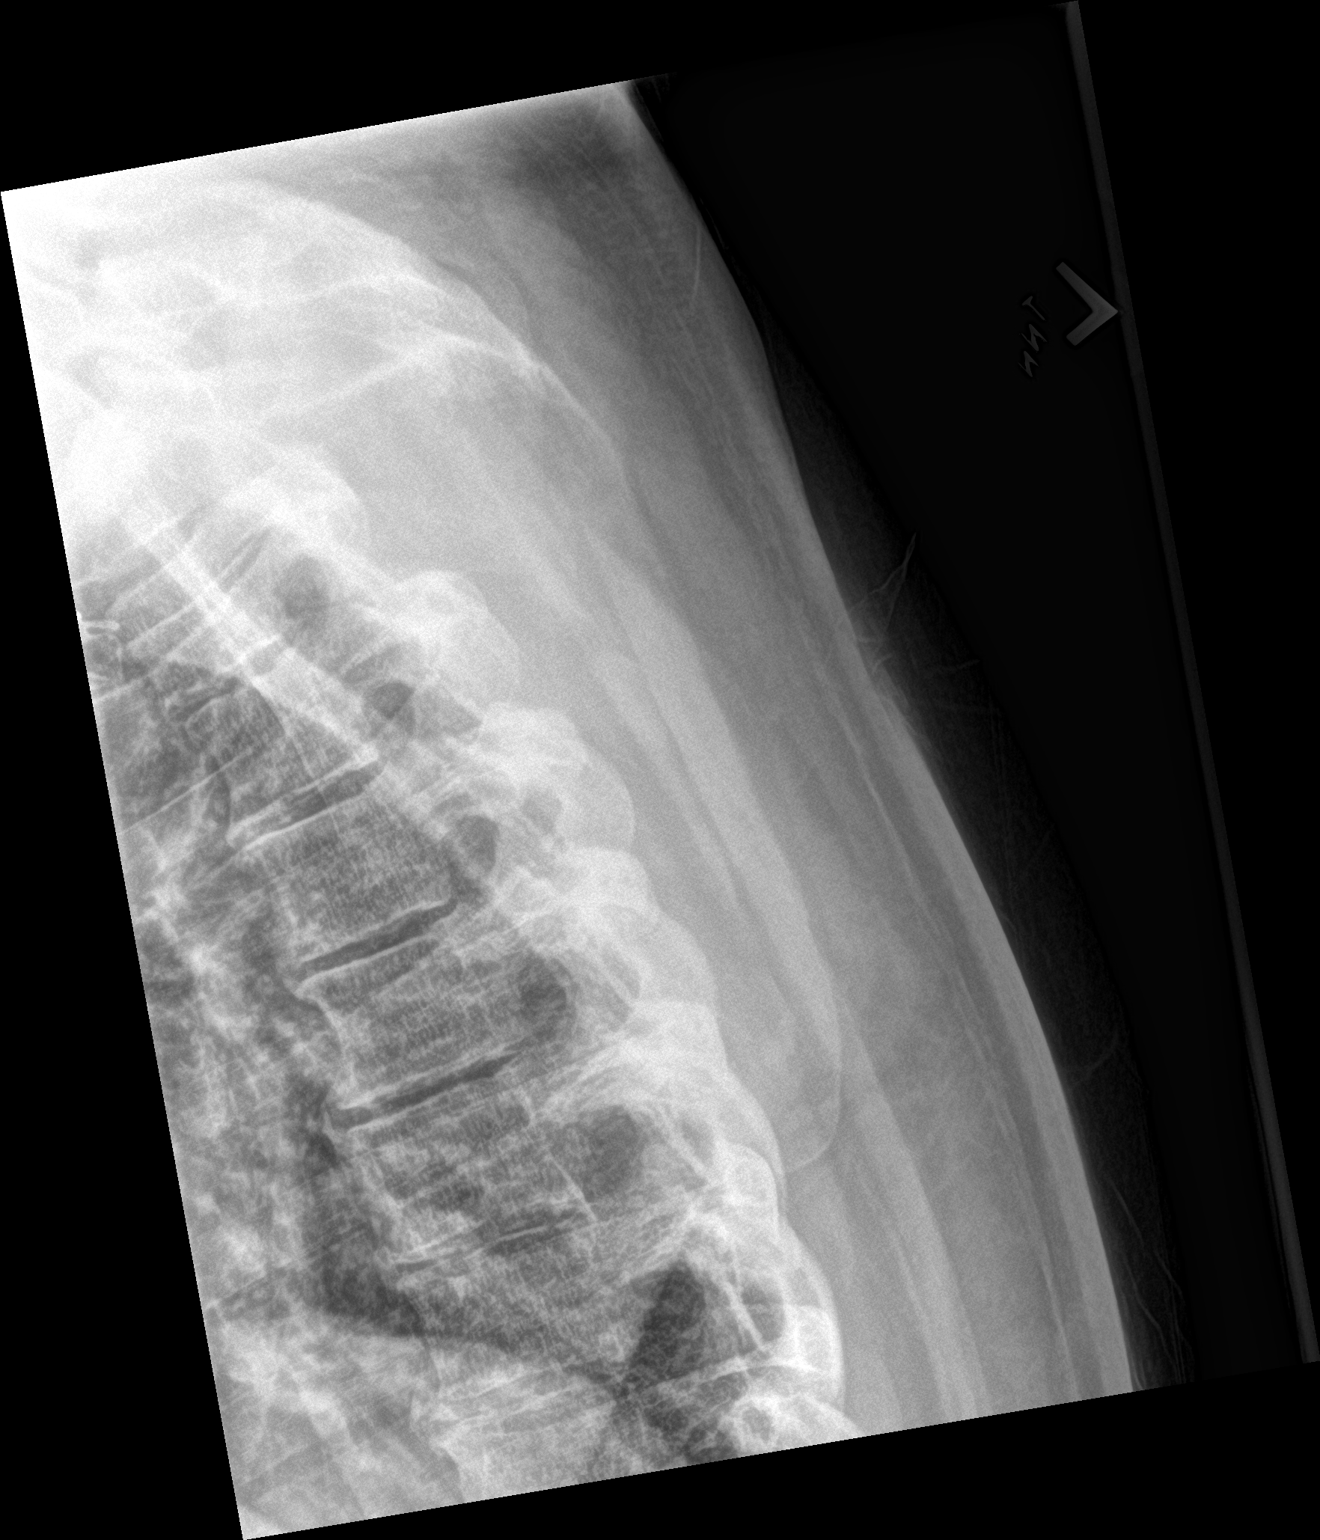

[3 of 3 positions shown; findings below may reference images not displayed]

FINDINGS: No evidence of thoracic region fracture. Posteromedial ribs appear
normal. Surgical clips present in the upper mediastinum and thoracic
inlet region.
IMPRESSION: No traumatic finding.

## 2019-10-18 IMAGING — CT CT CERVICAL SPINE W/O CM
4 of 8 series · 13 of 33 positions shown, 15 images · non-contrast
Comparison: CT HEAD October 14, 2017, CT chest May 31, 2015

CLINICAL DATA: Fell and hit head 6 days ago. Persistent headache
and dizziness.

EXAM:
CT HEAD WITHOUT CONTRAST
CT CERVICAL SPINE WITHOUT CONTRAST
TECHNIQUE: Multidetector CT imaging of the head and cervical spine was
performed following the standard protocol without intravenous
contrast. Multiplanar CT image reconstructions of the cervical spine
were also generated.

[Series 9: c spine soft · axial · 0.30mm/px · z∈[-239,-155]mm · 3 of 86 slices shown]
[im 22/86  soft-tissue]
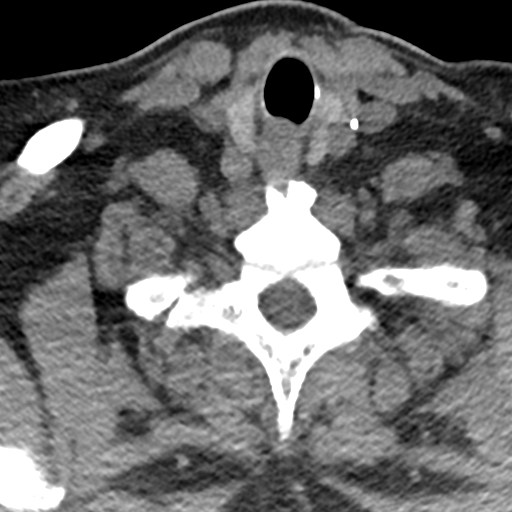
[im 43/86  soft-tissue]
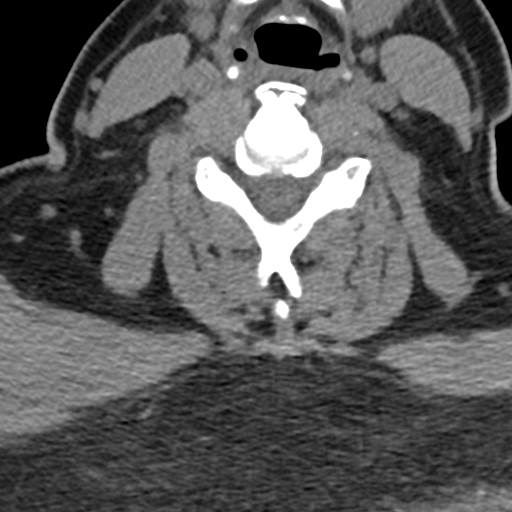
[im 64/86  soft-tissue]
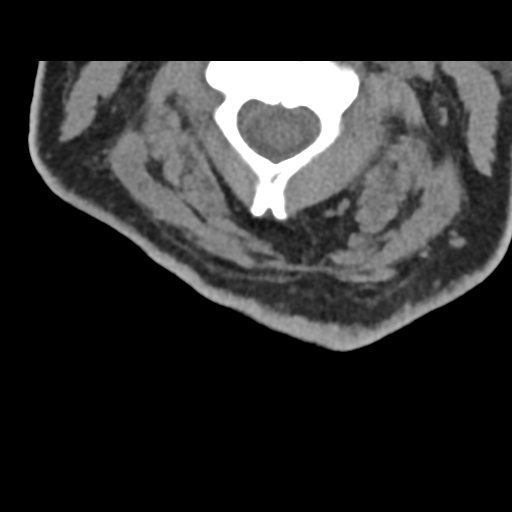

[Series 10: sagittal bone · sagittal · 0.25mm/px · 4 of 46 slices shown]
[im 10/46  bone]
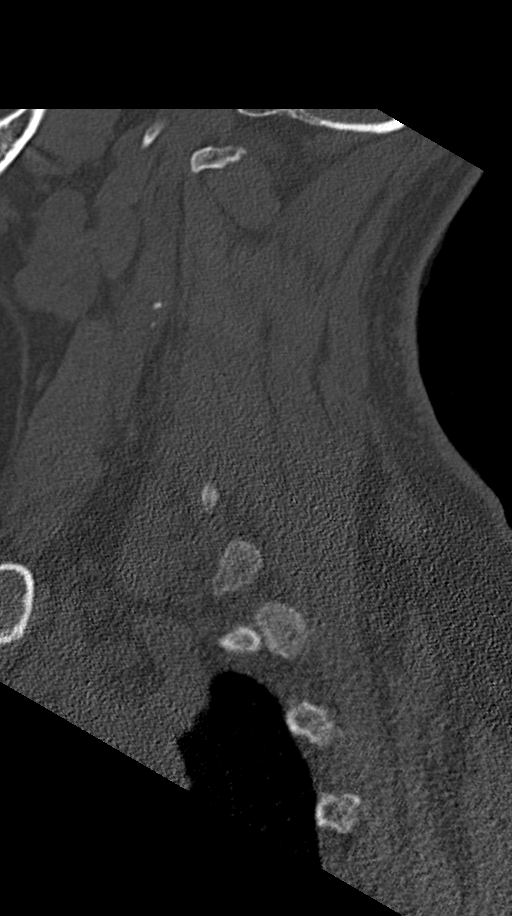
[im 19/46  bone]
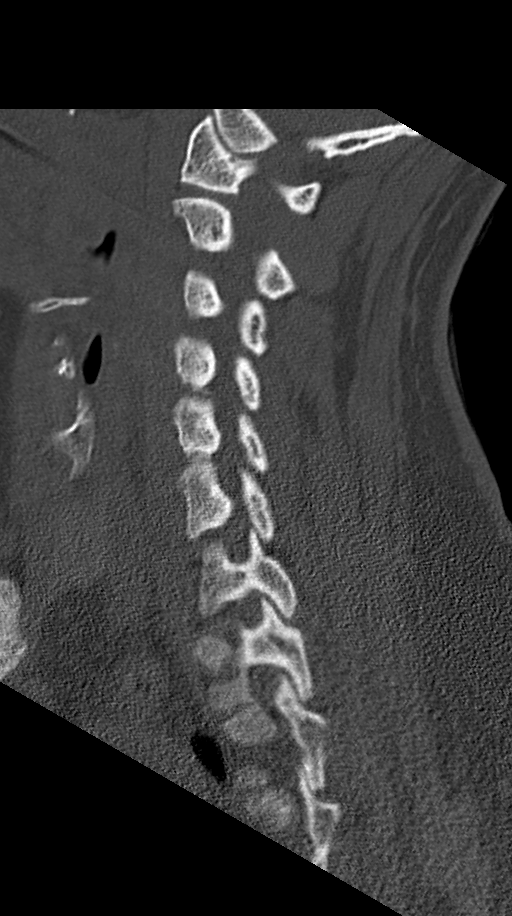
[im 28/46  bone]
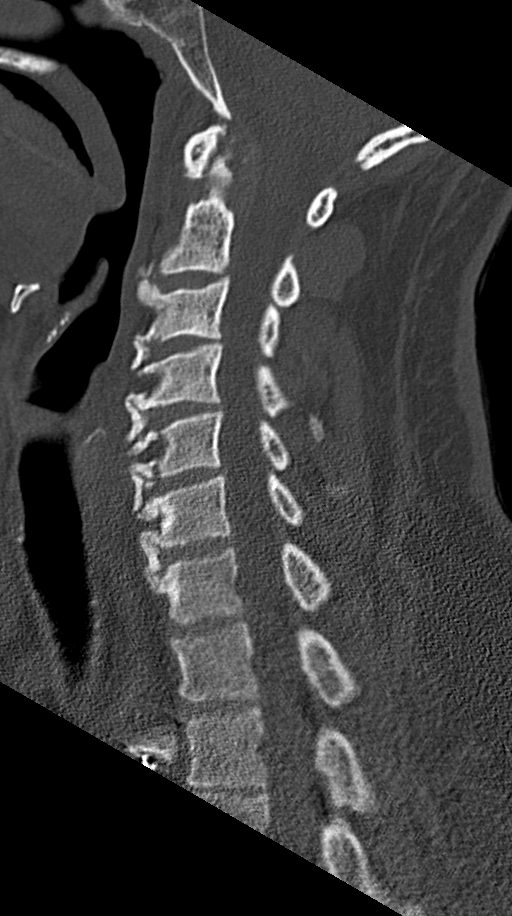
[im 37/46  bone]
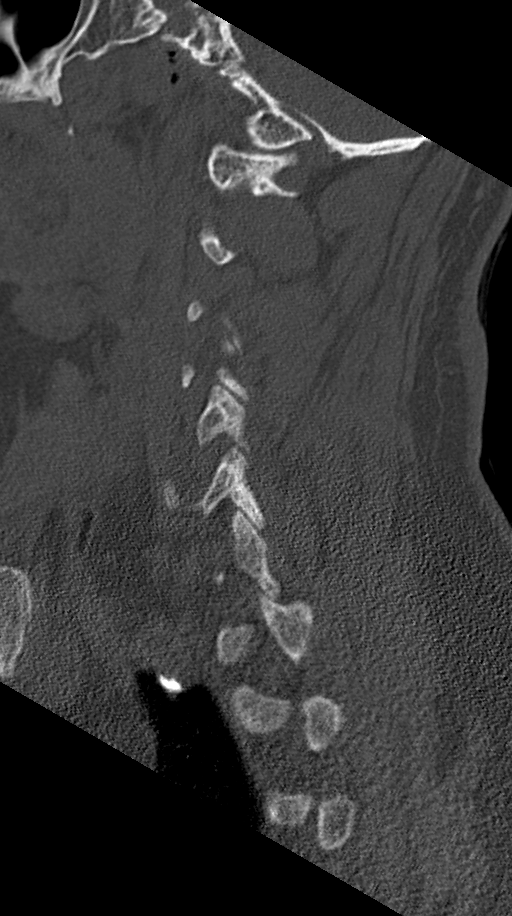

[Series 11: coronal bone · coronal · 0.25mm/px · 1 of 53 slices shown]
[im 27/53  bone]
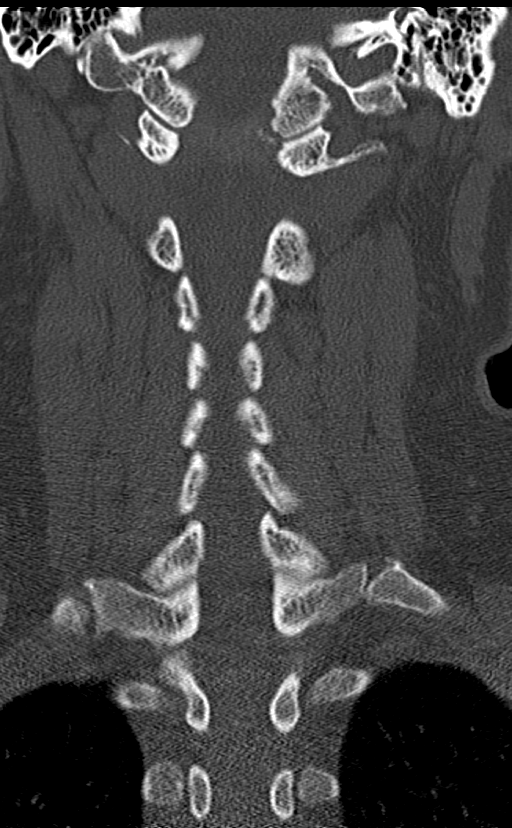

[Series 12: orthogonal bone · axial · 0.23mm/px · z∈[-284,-161]mm · 5 of 107 slices shown, 7 images]
[im 18/107  soft-tissue]
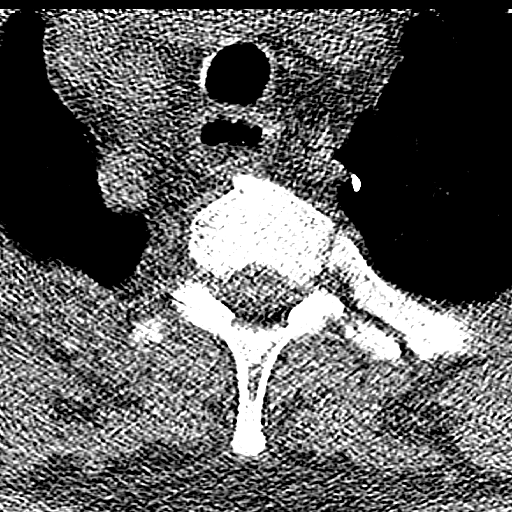
[im 18/107  bone]
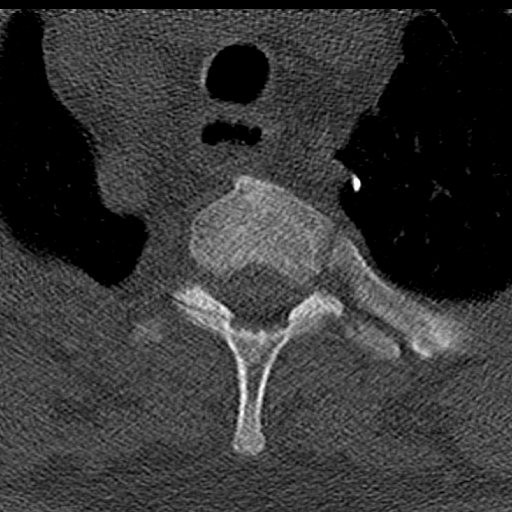
[im 36/107  bone]
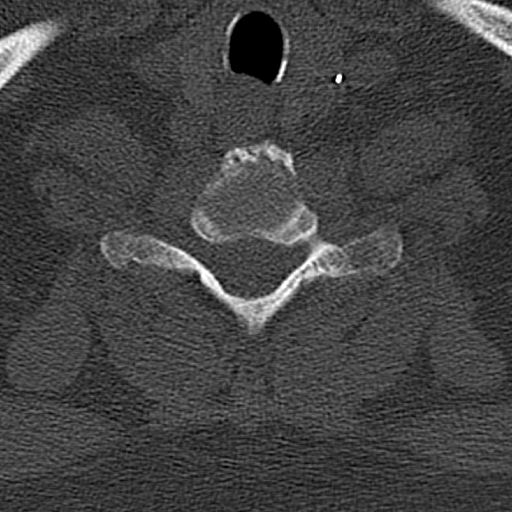
[im 54/107  bone]
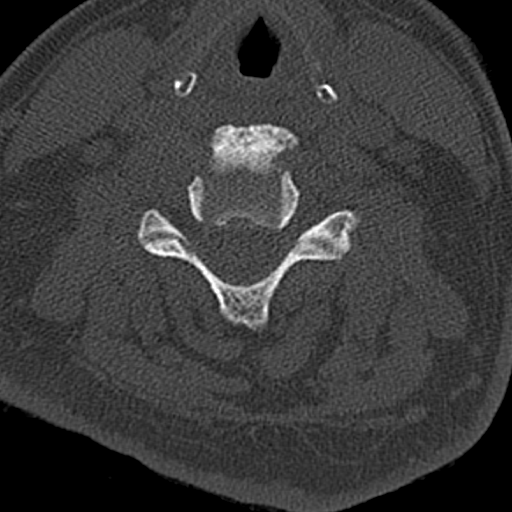
[im 71/107  bone]
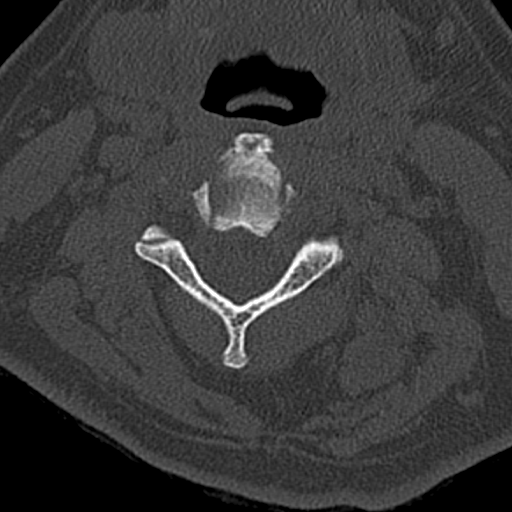
[im 89/107  soft-tissue]
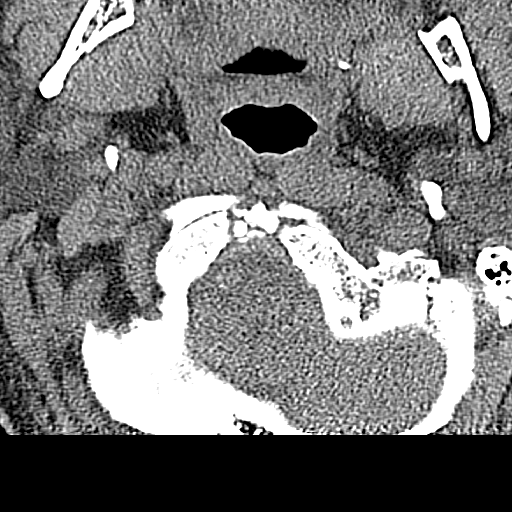
[im 89/107  bone]
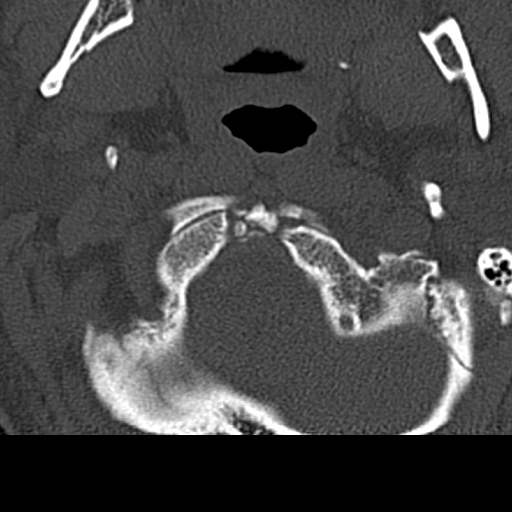

[13 of 33 positions shown; findings below may reference images not displayed]

FINDINGS: CT HEAD FINDINGS

BRAIN: No intraparenchymal hemorrhage, mass effect nor midline
shift. Bilateral basal ganglia and bilateral thalami lacunar
infarcts. Small area LEFT parietal encephalomalacia. Patchy
supratentorial and pontine white matter hypodensities. The
ventricles and sulci are normal. No acute large vascular territory
infarcts. No abnormal extra-axial fluid collections. Basal cisterns
are patent.

VASCULAR: Moderate calcific atherosclerosis.

SKULL/SOFT TISSUES: No skull fracture. No significant soft tissue
swelling.

ORBITS/SINUSES: The included ocular globes and orbital contents are
normal.Trace paranasal sinus mucosal thickening. Mastoid air cells
are well aerated.

OTHER: None.

CT CERVICAL SPINE FINDINGS

ALIGNMENT: Straightened lordosis.  Vertebral bodies in alignment.

SKULL BASE AND VERTEBRAE: Cervical vertebral bodies and posterior
elements are intact. Intervertebral disc heights preserved, bulky
ventral osteophyte seen with DISH. No destructive bony lesions. C1-2
articulation maintained, moderate osteoarthrosis.

SOFT TISSUES AND SPINAL CANAL: Nonacute. Mild calcific
atherosclerosis RIGHT carotid siphon.

DISC LEVELS: No significant osseous canal stenosis or neural
foraminal narrowing.

UPPER CHEST: Lung apices are clear. RIGHT-sided aortic arch with
closure device.

OTHER: None.
IMPRESSION: CT HEAD:

1. No acute intracranial process.
2. Moderate chronic small vessel ischemic changes and old lacunar
infarcts.
3. Old small LEFT parietal/MCA territory infarct.
4. Moderate intracranial atherosclerosis.

CT CERVICAL SPINE:

1. No fracture or malalignment.

## 2019-10-18 IMAGING — MR MR LUMBAR SPINE W/O CM
5 series · 31 of 48 positions shown · non-contrast
Comparison: Lumbar spine radiographs November 04, 2017

CLINICAL DATA: Low back pain and tightness.  Fell 6 days ago.

EXAM:
MRI LUMBAR SPINE WITHOUT CONTRAST
TECHNIQUE: Multiplanar, multisequence MR imaging of the lumbar spine was
performed. No intravenous contrast was administered.

[Series 5: T2 · sagittal · 4.0mm · 0.81mm/px · 6 of 17 slices shown (1 of 2)]
[im 1/17]
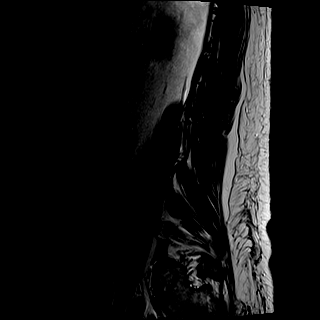
[im 4/17]
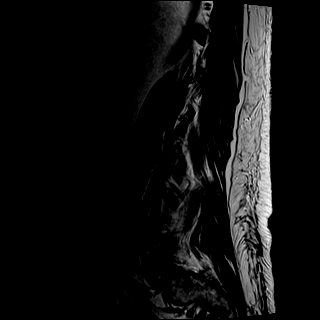
[im 7/17]
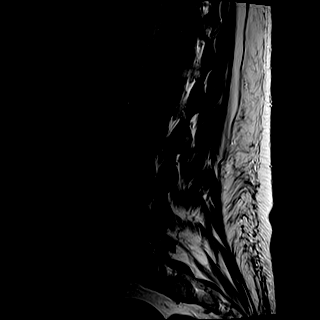
[im 10/17]
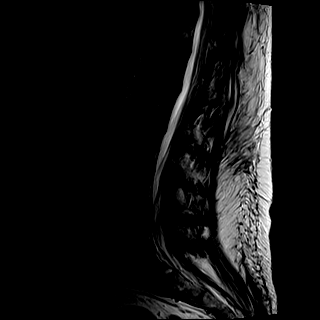
[im 13/17]
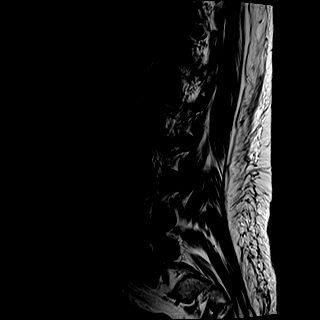
[im 17/17]
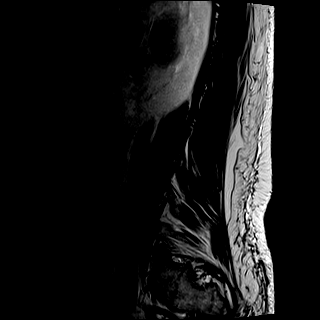

[Series 6: T1 · sagittal · 4.0mm · 0.81mm/px · 7 of 17 slices shown (1 of 2)]
[im 1/17]
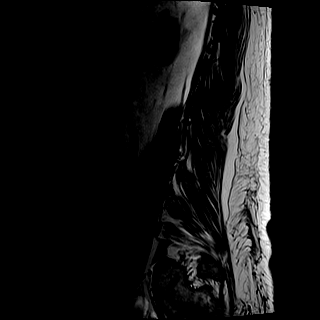
[im 3/17]
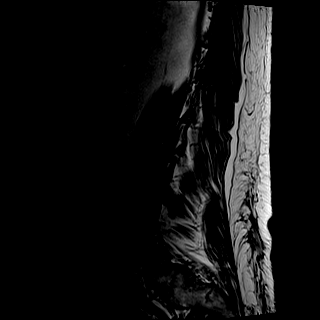
[im 6/17]
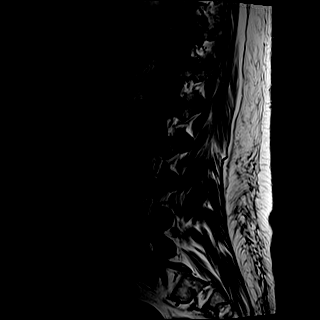
[im 9/17]
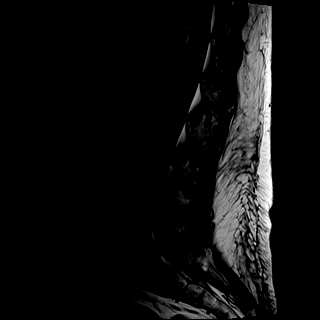
[im 11/17]
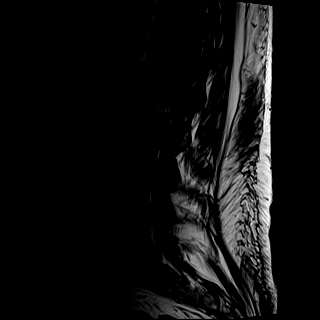
[im 14/17]
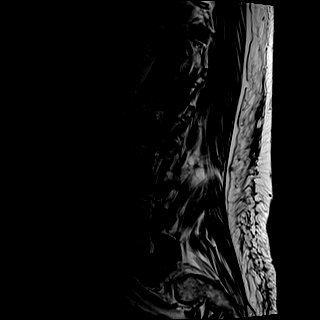
[im 17/17]
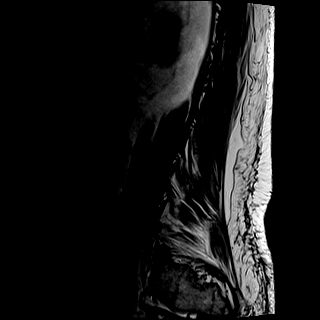

[Series 7: STIR · sagittal · 4.0mm · 0.41mm/px · 2 of 17 slices shown]
[im 1/17]
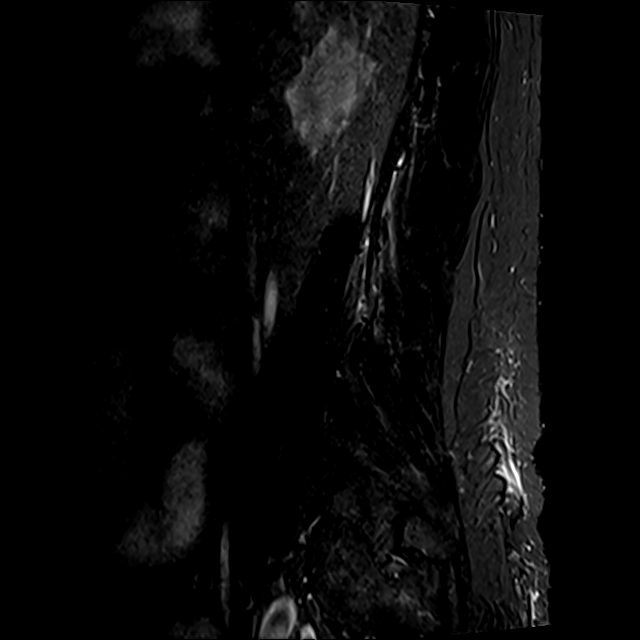
[im 3/17]
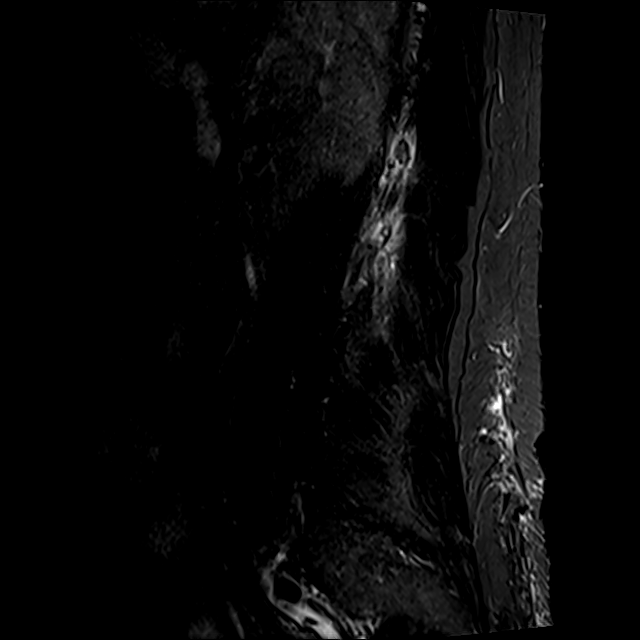

[Series 8: T2 · axial · 4.0mm · 0.78mm/px · z∈[-125,+89]mm · 8 of 36 slices shown (2 of 2)]
[im 1/36]
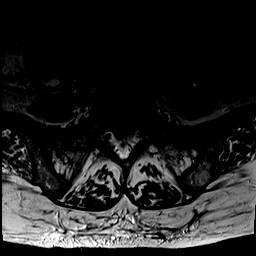
[im 6/36]
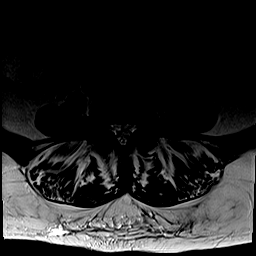
[im 11/36]
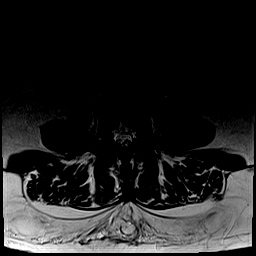
[im 17/36]
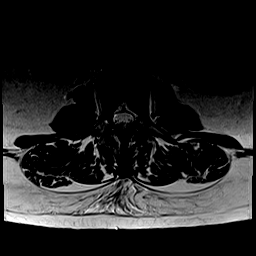
[im 19/36]
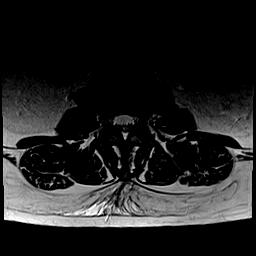
[im 25/36]
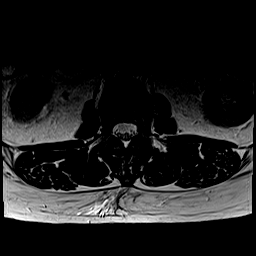
[im 30/36]
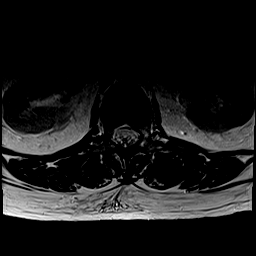
[im 36/36]
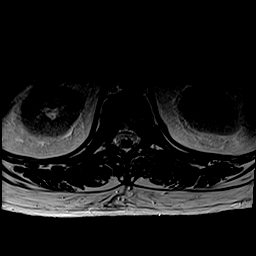

[Series 9: T1 · axial · 4.0mm · 0.39mm/px · z∈[-125,+89]mm · 8 of 36 slices shown (2 of 2)]
[im 1/36]
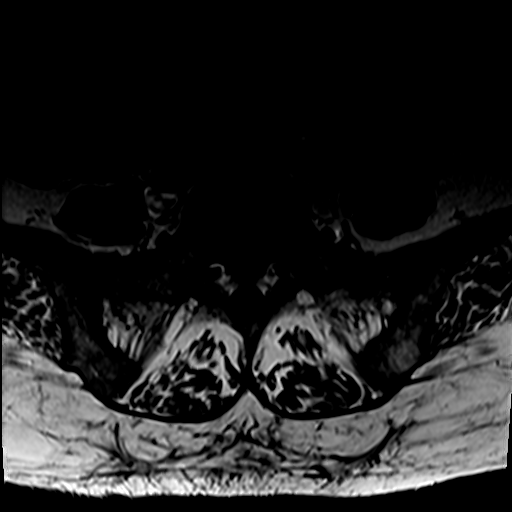
[im 6/36]
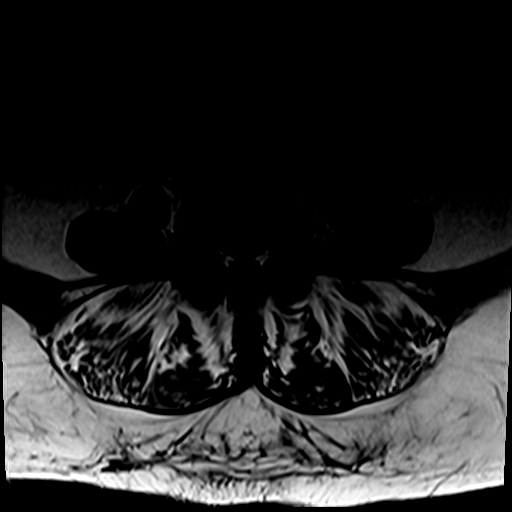
[im 11/36]
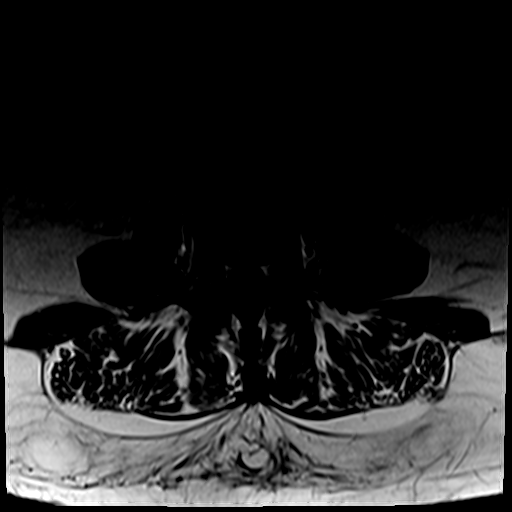
[im 17/36]
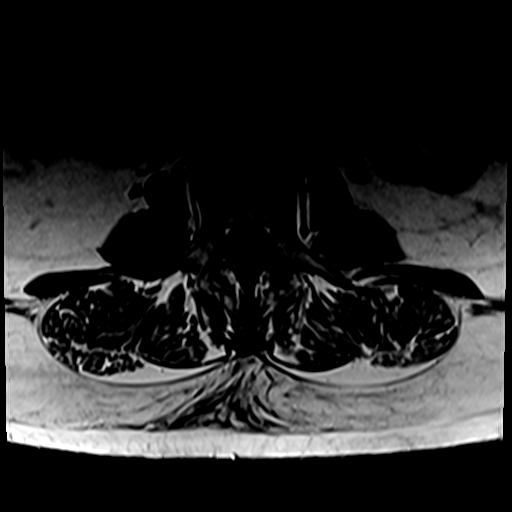
[im 19/36]
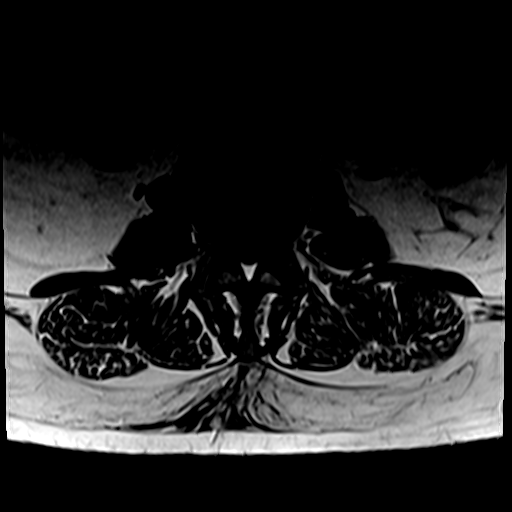
[im 25/36]
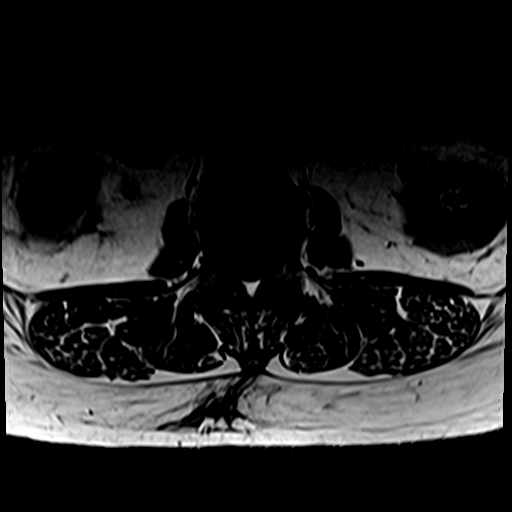
[im 30/36]
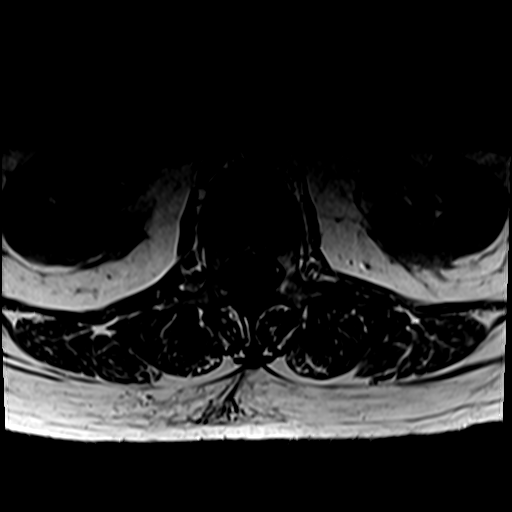
[im 36/36]
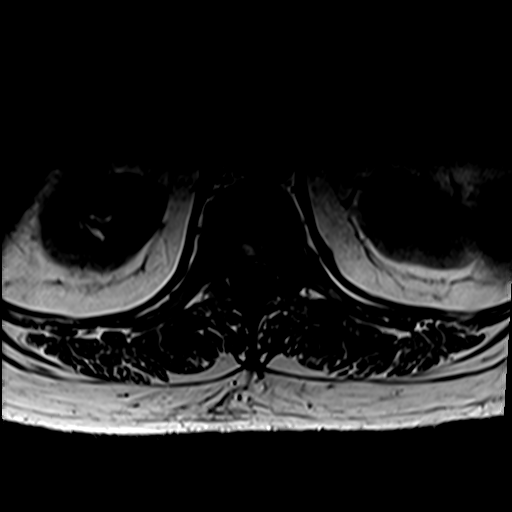

[31 of 48 positions shown; findings below may reference images not displayed]

FINDINGS: SEGMENTATION: For the purposes of this report, the last well-formed
intervertebral disc is reported as L5-S1.

ALIGNMENT: Maintained lumbar lordosis. No malalignment.

VERTEBRAE:Vertebral bodies are intact. Lumbar disc heights
maintained with mild disc desiccation most apparent at L4-5 and
L5-S1. Multilevel mild subacute and chronic discogenic endplate
changes. Old Schmorl's nodes. No acute or abnormal bone marrow
signal.

CONUS MEDULLARIS AND CAUDA EQUINA: Conus medullaris terminates at L1
and demonstrates normal morphology and signal characteristics. Cauda
equina is normal.

PARASPINAL AND OTHER SOFT TISSUES: Nonacute. Mild symmetric
paraspinal muscle atrophy. Colonic diverticulosis.

DISC LEVELS:

T12-L1: Annular bulging. No canal stenosis or neural foraminal
narrowing.

L1-2 through L3-4: No disc bulge, canal stenosis nor neural
foraminal narrowing.

L4-5: Small central disc extrusion with 13 mm superior and inferior
contiguous migration, undersurface annular fissure. Encroachment
upon the traversing L5 nerves within lateral recess. Minimal facet
arthropathy. Mild canal stenosis. Minimal neural foraminal
narrowing.

L5-S1: Small broad-based disc bulge. Mild facet arthropathy. No
canal stenosis. Mild bilateral neural foraminal narrowing.
IMPRESSION: 1. No fracture or malalignment.  No acute osseous process.
2. Small L4-5 disc extrusion could affect the traversing L5 nerves.
3. Mild canal stenosis L4-5. Minimal to mild L4-5 and L5-S1 neural
foraminal narrowing.

## 2019-10-18 IMAGING — CR DG LUMBAR SPINE COMPLETE 4+V
5 series · 5 of 5 positions shown · non-contrast
Comparison: 10/30/2017

CLINICAL DATA: Fell 1 week ago.  Left-sided back pain.

EXAM:
LUMBAR SPINE - COMPLETE 4+ VIEW

[l-spine ap]
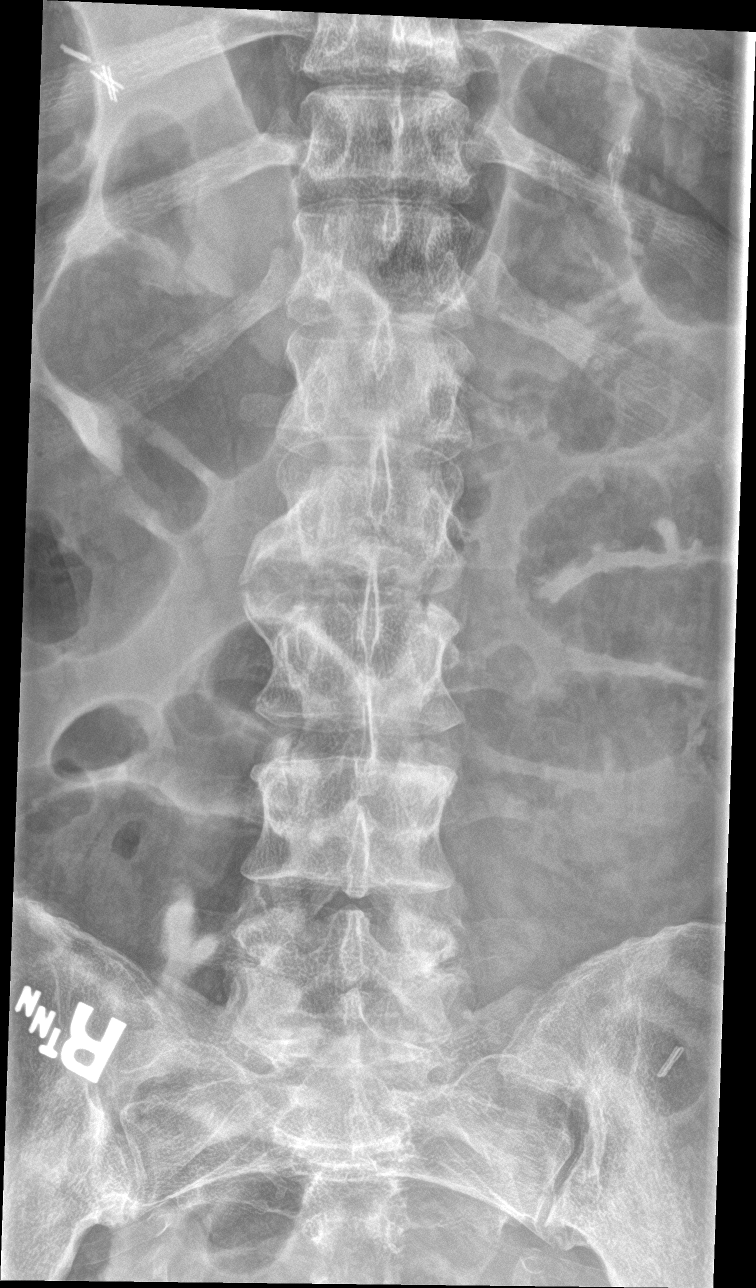

[l-spine obl (1 of 2)]
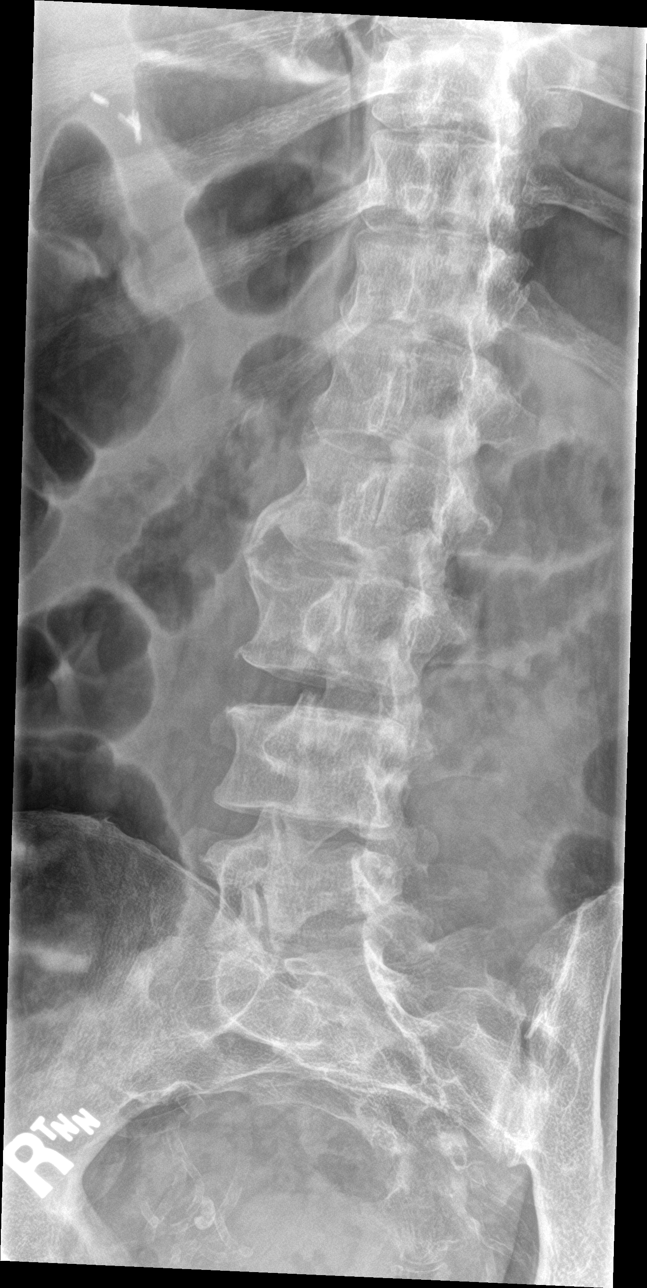

[l-spine obl (2 of 2)]
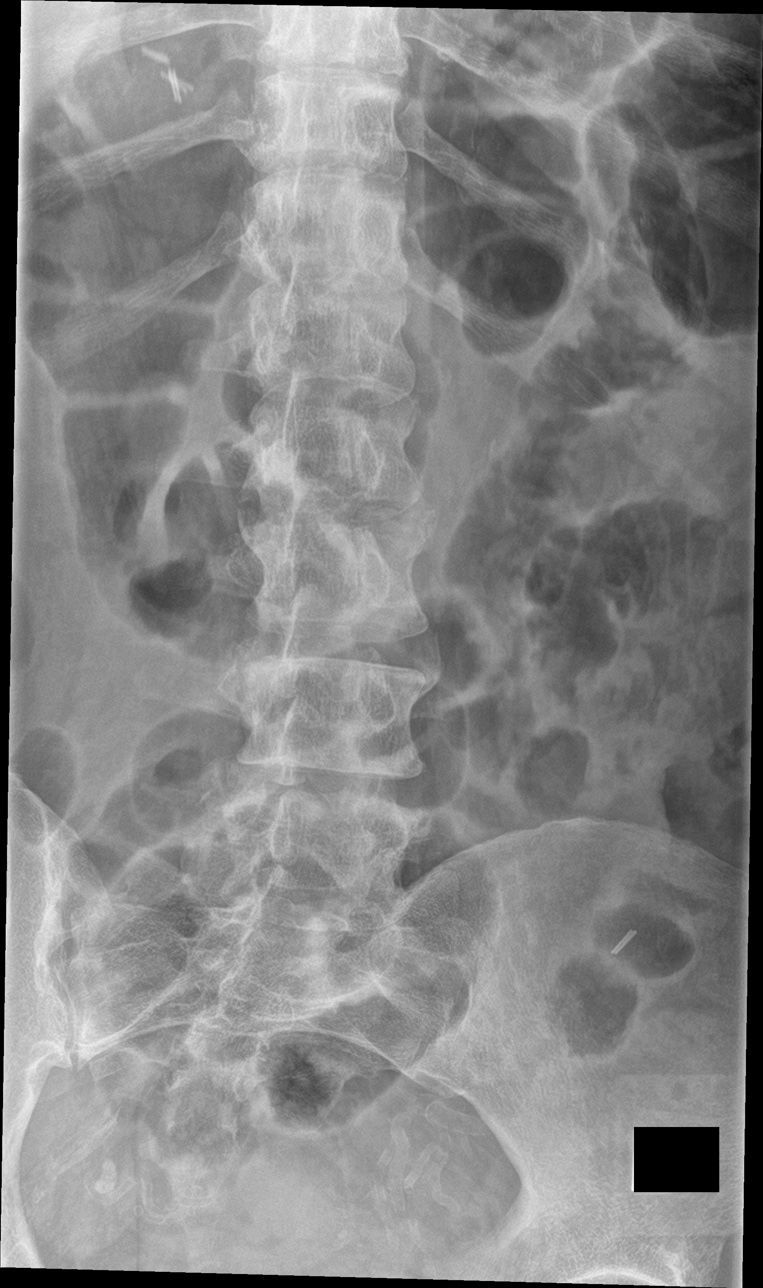

[l-spine lat]
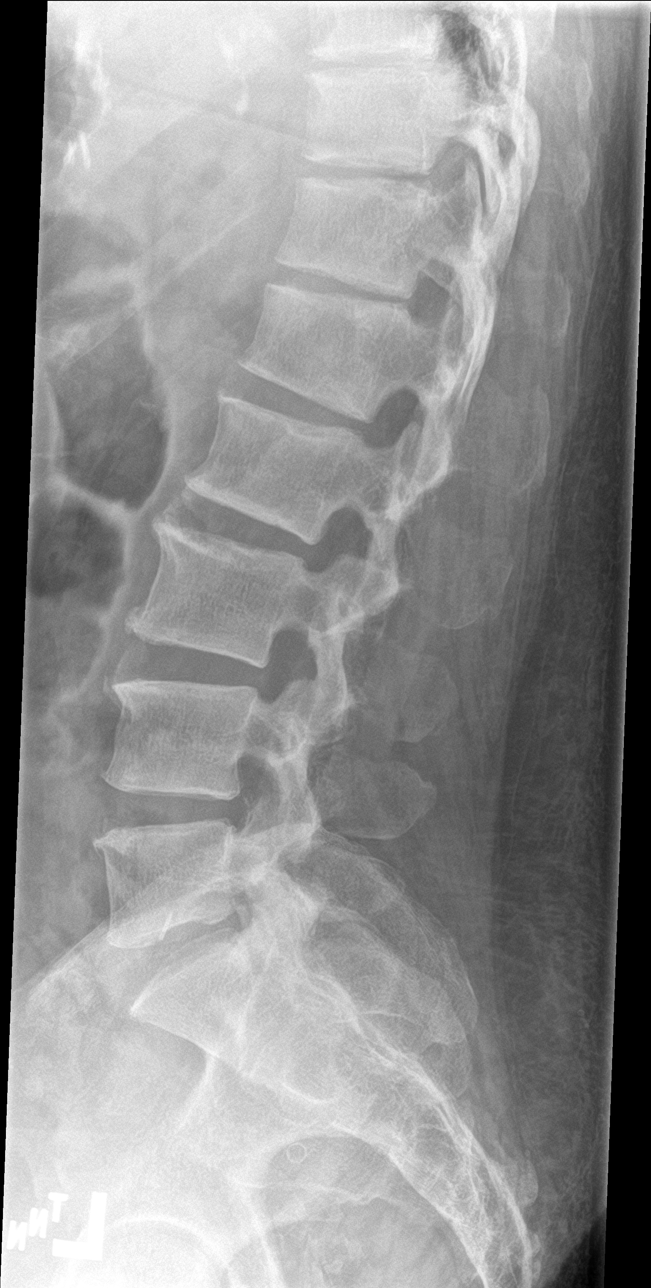

[l-spine spot]
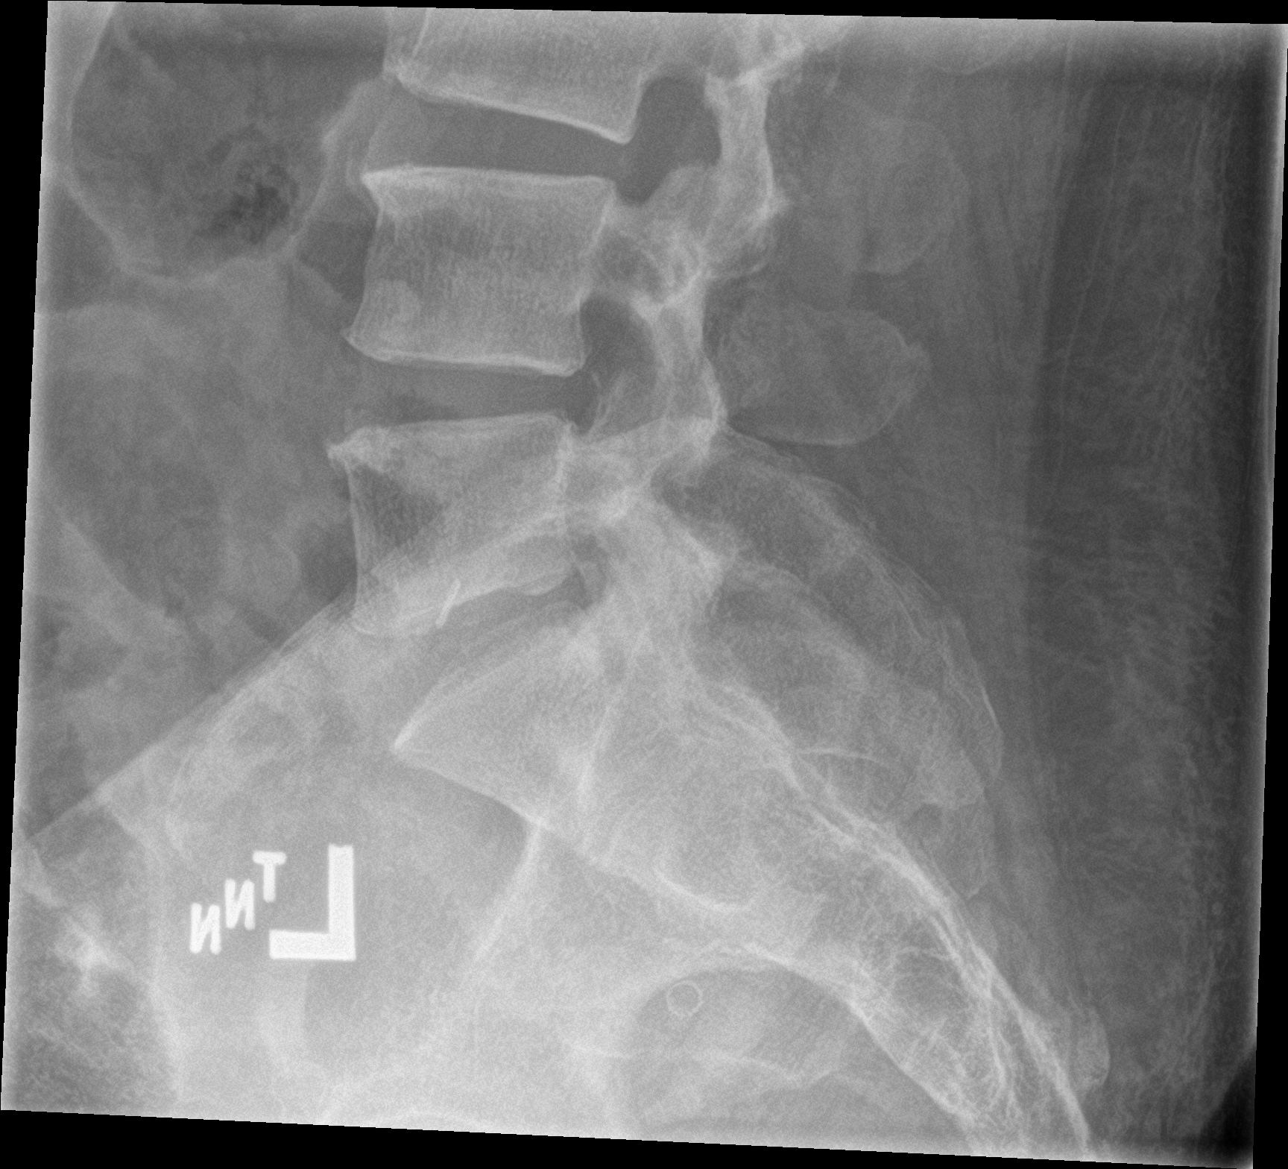

[5 of 5 positions shown; findings below may reference images not displayed]

FINDINGS: Five lumbar type vertebral bodies show normal alignment. No evidence
of lumbar region fracture. No significant disc space narrowing.
Bridging endplate osteophytes incidentally noted. Some lower lumbar
facet osteoarthritis.
IMPRESSION: No acute or traumatic finding. Mild degenerative changes as above,
unchanged from previous studies.

## 2019-10-18 MED ORDER — METHOCARBAMOL 500 MG PO TABS: 500.0000 mg | ORAL_TABLET | Freq: Once | ORAL | Status: DC

## 2019-10-18 MED ORDER — SODIUM CHLORIDE 0.9 % IV SOLN
100.0000 mg | Freq: Two times a day (BID) | INTRAVENOUS | Status: DC
  Administered 2019-10-18 – 2019-10-20 (×5): 100 mg via INTRAVENOUS
  Filled 2019-10-18 (×8): qty 100

## 2019-10-18 MED ORDER — SODIUM CHLORIDE 0.9 % IV SOLN
2.0000 g | INTRAVENOUS | Status: DC
  Administered 2019-10-18 – 2019-10-20 (×3): 2 g via INTRAVENOUS
  Filled 2019-10-18 (×2): qty 20
  Filled 2019-10-18 (×2): qty 2

## 2019-10-18 MED ORDER — MORPHINE SULFATE (PF) 2 MG/ML IV SOLN: 0.5000 mg | Freq: Once | INTRAVENOUS | Status: DC

## 2019-10-18 MED ORDER — MORPHINE SULFATE (PF) 2 MG/ML IV SOLN: 1.0000 mg | INTRAVENOUS | Status: DC | PRN

## 2019-10-18 NOTE — TOC Initial Note (Signed)
Transition of Care [facility]) - Initial/Assessment Note    Patient Details  Name: [name] MRN: [id number] Date of Birth: 1970/07/07  Transition of Care [facility]) CM/SW Contact:    Shelbie Ammons, RN Phone Number: 10/18/2019, 1:56 PM  Clinical Narrative:    RNCM completed high risk assessment, patient still lives at home with her son and reports he is her transportation. She reports that she still sees Dr. Caryn Section and there has been no changes in her status. She is still open to [facility] for home health services.  RNCM verified status with Corene Cornea from [facility]                     Patient Goals and CMS Choice        Expected Discharge Plan and Services                                                Prior Living Arrangements/Services                       Activities of Daily Living Home Assistive Devices/Equipment: None ADL Screening (condition at time of admission) Patient's cognitive ability adequate to safely complete daily activities?: Yes Is the patient deaf or have difficulty hearing?: No Does the patient have difficulty seeing, even when wearing glasses/contacts?: No Does the patient have difficulty concentrating, remembering, or making decisions?: No Patient able to express need for assistance with ADLs?: Yes Does the patient have difficulty dressing or bathing?: No Independently performs ADLs?: Yes (appropriate for developmental age) Does the patient have difficulty walking or climbing stairs?: Yes Weakness of Legs: Both Weakness of Arms/Hands: Both  Permission Sought/Granted                  Emotional Assessment              Admission diagnosis:  Hypoglycemia [E16.2] 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 Chronic kidney disease, stage 3a 09/02/2019  . Hypoglycemia   . Family history of breast cancer 05/23/2019  . Family history of colon cancer  05/23/2019  . Abnormal LFTs 04/18/2019  . Esophageal dysphagia   . Gastrointestinal tract imaging abnormality   . Trochanteric bursitis, left hip 03/17/2019  . Malignant neoplasm of vulva, unspecified ([place]) 03/10/2019  . Peripheral vascular disease, unspecified ([place]) 03/10/2019  . Paroxysmal supraventricular tachycardia ([place]) 03/10/2019  . Contusion of left hip 03/06/2019  . Chest pain, moderate coronary artery risk 01/27/2019  . Melena   . Hypertensive urgency   . Recurrent chest pain 01/19/2019  . Depression, major, single episode, moderate ([place]) 12/09/2018  . Closed nondisplaced intertrochanteric fracture of left femur ([place]) 10/12/2018  . Hypotension 06/26/2018  . Autonomic neuropathy 03/24/2018  . Acute delirium 03/03/2018  . H/O gastric bypass 03/02/2018  . Hypertension associated with stage 3 chronic kidney disease due to type 2 diabetes mellitus ([place]) 02/20/2018  . SI (sacroiliac) joint dysfunction 12/02/2017  . Effort angina ([place]) 06/29/2017  . Unstable angina ([place]) 06/24/2017  . Syncope 04/09/2017  . Insomnia 03/18/2017  . Ischemic cardiomyopathy   . Arthritis   . Anxiety   . Tendinitis of upper biceps tendon of right shoulder 03/16/2017  . Hx of colonic polyps   . H/O medication noncompliance 12/14/2015  .  CAD (coronary artery disease)   . Hypertensive heart disease   . Status post bariatric surgery 06/04/2015  . Carotid stenosis 04/30/2015  . Stable angina pectoris ([place]) 04/17/2015  . Iron deficiency anemia 03/22/2015  . Vitamin B12 deficiency 02/18/2015  . Misuse of medications for pain 02/18/2015  . Major depressive disorder, recurrent, severe with psychotic features ([place]) 02/15/2015  . Helicobacter pylori infection 11/23/2014  . Hemiparesis, left ([place]) 11/23/2014  . Malignant melanoma ([place]) 08/25/2014  . Chronic systolic CHF (congestive heart failure) ([place])   . Incomplete bladder emptying 07/12/2014  . Hypothyroidism 12/30/2013  . Aberrant subclavian artery  11/17/2013  . Multiple sclerosis ([place]) 11/02/2013  . History of CVA with residual deficit 06/20/2013  . Headache, migraine 05/29/2013  . Hyperlipidemia   . GERD (gastroesophageal reflux disease)   . Neuropathy ([place]) 01/02/2011  . Stroke ([place]) 06/21/2008  . Depression with anxiety 05/01/2008  . Essential hypertension 05/01/2008   PCP:  [name], MD Pharmacy:   [facility] [address] ([address]), [place] [address] [place]) [place] [address] Phone: [phone] Fax: [place]), [place] - [address]  ([place]) [place] [address] Phone: [phone] Fax: [phone]     Social Determinants of Health (SDOH) Interventions    Readmission Risk Interventions Readmission Risk Prevention Plan 10/18/2019 09/05/2019 06/26/2018  Transportation Screening Complete Complete Complete  Medication Review (RN Care Manager) Complete Complete Complete  PCP or Specialist appointment within 3-5 days of discharge Complete Complete -  [place] or Home Care Consult Complete - Complete  SW Recovery Care/Counseling Consult Complete Complete -  SW Consult Not Complete Comments - - -  Palliative Care Screening Not Applicable Not Applicable -  [place] Not Applicable Not Applicable -  Some recent data might be hidden

## 2019-10-18 NOTE — Progress Notes (Deleted)
   10/17/19 2049  Assess: MEWS Score  Temp (!) 102.8 F (39.3 C)  BP (!) 156/84  Pulse Rate 97  Resp 18  SpO2 100 %  O2 Device Room Air  Assess: MEWS Score  MEWS Temp 2  MEWS Systolic 0  MEWS Pulse 0  MEWS RR 0  MEWS LOC 0  MEWS Score 2  MEWS Score Color Yellow  Assess: if the MEWS score is Yellow or Red  Were vital signs taken at a resting state? Yes  Focused Assessment No change from prior assessment  Early Detection of Sepsis Score *See Row Information* High  MEWS guidelines implemented *See Row Information* Yes  Treat  MEWS Interventions Administered prn meds/treatments  Pain Scale 0-10  Pain Score 8  Pain Type Acute pain  Pain Location Chest  Pain Descriptors / Indicators Aching  Pain Frequency Constant  Pain Onset On-going  Pain Intervention(s) Medication (See eMAR)  Take Vital Signs  Increase Vital Sign Frequency  Yellow: Q 2hr X 2 then Q 4hr X 2, if remains yellow, continue Q 4hrs  Escalate  MEWS: Escalate Yellow: discuss with charge nurse/RN and consider discussing with provider and RRT  Notify: Charge Nurse/RN  Name of Charge Nurse/RN Notified Producer, television/film/video  Date Charge Nurse/RN Notified 10/17/19  Time Charge Nurse/RN Notified 2050  Notify: Provider  Provider Name/Title [name] NP  Date Provider Notified 10/17/19  Time Provider Notified 2030  Notification Type Page  Notification Reason Other (Comment) (Pt Complaining of chest pain, yellow mews of 2)  Response See new orders  Date of Provider Response 10/17/19  Time of Provider Response 2055  Document  Patient Outcome Stabilized after interventions  Progress note created (see row info) Yes     10/17/19 2049  Assess: MEWS Score  Temp (!) 102.8 F (39.3 C)  BP (!) 156/84  Pulse Rate 97  Resp 18  SpO2 100 %  O2 Device Room Air  Assess: MEWS Score  MEWS Temp 2  MEWS Systolic 0  MEWS Pulse 0  MEWS RR 0  MEWS LOC 0  MEWS Score 2  MEWS Score Color Yellow  Assess: if the MEWS score is  Yellow or Red  Were vital signs taken at a resting state? Yes  Focused Assessment No change from prior assessment  Early Detection of Sepsis Score *See Row Information* High  MEWS guidelines implemented *See Row Information* Yes  Treat  MEWS Interventions Administered prn meds/treatments  Pain Scale 0-10  Pain Score 8  Pain Type Acute pain  Pain Location Chest  Pain Descriptors / Indicators Aching  Pain Frequency Constant  Pain Onset On-going  Pain Intervention(s) Medication (See eMAR)  Take Vital Signs  Increase Vital Sign Frequency  Yellow: Q 2hr X 2 then Q 4hr X 2, if remains yellow, continue Q 4hrs  Escalate  MEWS: Escalate Yellow: discuss with charge nurse/RN and consider discussing with provider and RRT  Notify: Charge Nurse/RN  Name of Charge Nurse/RN Notified Estill Bamberg RN  Date Charge Nurse/RN Notified 10/17/19  Time Charge Nurse/RN Notified 2050  Notify: Provider  Provider Name/Title [name] NP  Date Provider Notified 10/17/19  Time Provider Notified 2030  Notification Type Page  Notification Reason Other (Comment) (Pt Complaining of chest pain, yellow mews of 2)  Response See new orders  Date of Provider Response 10/17/19  Time of Provider Response 2055  Document  Patient Outcome Stabilized after interventions  Progress note created (see row info) Yes

## 2019-10-18 NOTE — Assessment & Plan Note (Addendum)
-  Patient developed fever overnight of 10/17/2019.  Etiology unclear at this time.  No other obvious symptoms to suggest a source at this time.  CXR performed overnight which was clear.  Procalcitonin mildly elevated.  Patient was started on Rocephin and doxycycline - UA also showed some LE and few bacteria; follow up urine culture however she has no urinary symptoms -COVID-19 swab negative on admission, 10/13/2019 -Given prolonged hospitalization, will repeat swab at this time: negative swab again on 9/15.  -Fevers defervesced and no obvious source revealed itself -Will complete Augmentin course at discharge SEPSIS RULED OUT

## 2019-10-18 NOTE — Progress Notes (Signed)
Pt Complained of chest pain. Nitro given X1 with positive results. Pt has a temp of 102.8. Refusing to take tylenol at this time. [name] Np made aware. Np come to bedside to assess the patient. New orders received. Will cont to monitor pt.

## 2019-10-18 NOTE — Telephone Encounter (Signed)
Patient calling to inform provider there is now a consult in place so someone can round.

## 2019-10-18 NOTE — Consult Note (Signed)
Cardiology Consultation:   Patient ID: [name] MRN: [id number]; DOB: 1970/12/10  Admit date: 10/13/2019 Date of Consult: 10/18/2019  Primary Care Provider: [name], MD [facility] Cardiologist: Ida Rogue, MD  [place] Electrophysiologist:  None    Patient Profile:   [name] is a 49 y.o. female with a hx of CAD/PCI to LAD, D1 who is being seen today for the evaluation of chest pain at the request of Dr. Sabino Gasser.  History of Present Illness:   [name] is a 49 year old female with history of CAD status post PCI to LAD/D1, ischemic cardiomyopathy with normalization of EF to 60 to 65% in 2020, diabetes, hypertension, hyperlipidemia CKD, left carotid to subclavian artery bypass,  diabetes who presents due to low blood sugars.  Patient states having episodes of weakness, diaphoresis starting 5 days ago.  She checked her blood sugars at home and noted levels in the 50s.  Her son who is a Airline pilot advised her to come to the emergency room.  She states having similar episodes in the past.  She states having chronic on and off chest pain.  Sometimes palpation to her sternum makes the pain worse.  In the ED, her blood sugar was 65.  EKG was sinus tachycardia otherwise normal.  Troponins were normal.  Due to concern for angina, Coreg and Imdur doses were increased.  She was managed with dextrose infusion per primary team.   Past Medical History:  Diagnosis Date  . Acute colitis 01/27/2017  . Acute pyelonephritis   . Anemia    iron deficiency anemia  . Aortic arch aneurysm ([place])   . BRCA negative 2014  . CAD (coronary artery disease)    a. 08/2003 Cath: LAD 30-40-med Rx; b. 11/2014 PCI: LAD 44m(3.25x23 Xience Alpine DES); c. 06/2015 PCI: D1 (2.25x12 Resolute Integrity DES); d. 06/2017 PCI: Patent mLAD stent, D2 95 (PTCA); e. 09/2017 PCI: D2 99ost (CBA); d. 12/2017 Cath: LM nl, LAD 331m80d (small), D1 40ost, D2 95ost, LCX 40p, RCA  40ost/p->Med rx for D2 given restenosis.  . Colitis 06/03/2015  . Colon polyp   . CVA (cerebral vascular accident) ([facility]   Left side weakness.   . Degenerative tear of glenoid labrum of right shoulder 03/15/2017  . Diabetes mellitus without complication ([facility]  . Family history of breast cancer    BRCA neg 2014  . Femur fracture, left ([facility]9/10/2018  . Gastric ulcer 04/27/2011  . History of echocardiogram    a. 03/2017 Echo: EF 60-65%, no rwma; b. 02/2018 Echo: EF 60-65%, no rwma. Nl RV fxn. No cardiac source of emboli (admitted w/ stroke).  . Hypertension   . Malignant melanoma of skin of scalp ([facility] [place]  . MI, acute, non ST segment elevation ([facility]  . Neuromuscular disorder ([facility]  . S/P drug eluting coronary stent placement 06/04/2015  . Sepsis ([facility]2/14/2019  . [facility]   a. 02/2018 MRI: 15m28mate acute/early subacute L medial frontal lobe inarct; b. 02/2018 MRA No large vessel occlusion or aneurysm. Mod to sev L P2 stenosis. thready L vertebral artery, diffusely dzs'd; c. 02/2018 Carotid U/S: <50% bilat ICA dzs.    Past Surgical History:  Procedure Laterality Date  . APPENDECTOMY    . CARDIAC CATHETERIZATION N/A 11/09/2014   Procedure: Coronary Angiography;  Surgeon: TimMinna MerrittsD;  [facility];  Service: Cardiovascular;  Laterality: N/A;  . CARDIAC CATHETERIZATION N/A 11/12/2014  Procedure: Coronary Stent Intervention;  Surgeon: Isaias Cowman, MD;  Location: [place] CV LAB;  Service: Cardiovascular;  Laterality: N/A;  . CARDIAC CATHETERIZATION N/A 04/18/2015   Procedure: Left Heart Cath and Coronary Angiography;  Surgeon: Minna Merritts, MD;  Location: [place] CV LAB;  Service: Cardiovascular;  Laterality: N/A;  . CARDIAC CATHETERIZATION Left 06/04/2015   Procedure: Left Heart Cath and Coronary Angiography;  Surgeon: Wellington Hampshire, MD;  Location: [place] CV LAB;  Service: Cardiovascular;  Laterality: Left;  . CARDIAC  CATHETERIZATION N/A 06/04/2015   Procedure: Coronary Stent Intervention;  Surgeon: Wellington Hampshire, MD;  Location: [place] CV LAB;  Service: Cardiovascular;  Laterality: N/A;  . CESAREAN SECTION  2001  . CHOLECYSTECTOMY N/A 11/18/2016   Procedure: LAPAROSCOPIC CHOLECYSTECTOMY WITH INTRAOPERATIVE CHOLANGIOGRAM;  Surgeon: Christene Lye, MD;  Location: [facility];  Service: General;  Laterality: N/A;  . COLONOSCOPY WITH PROPOFOL N/A 04/27/2016   Procedure: COLONOSCOPY WITH PROPOFOL;  Surgeon: [name], MD;  Location: [place];  Service: Endoscopy;  Laterality: N/A;  . COLONOSCOPY WITH PROPOFOL N/A 01/12/2018   Procedure: COLONOSCOPY WITH PROPOFOL;  Surgeon: Toledo, Benay Pike, MD;  Location: [facility] ENDOSCOPY;  Service: Endoscopy;  Laterality: N/A;  . CORONARY ANGIOPLASTY    . CORONARY BALLOON ANGIOPLASTY N/A 06/29/2017   Procedure: CORONARY BALLOON ANGIOPLASTY;  Surgeon: Wellington Hampshire, MD;  Location: [place] CV LAB;  Service: Cardiovascular;  Laterality: N/A;  . CORONARY BALLOON ANGIOPLASTY N/A 09/20/2017   Procedure: CORONARY BALLOON ANGIOPLASTY;  Surgeon: Wellington Hampshire, MD;  Location: [place] CV LAB;  Service: Cardiovascular;  Laterality: N/A;  . DILATION AND CURETTAGE OF UTERUS    . ESOPHAGOGASTRODUODENOSCOPY (EGD) WITH PROPOFOL N/A 09/14/2014   Procedure: ESOPHAGOGASTRODUODENOSCOPY (EGD) WITH PROPOFOL;  Surgeon: Josefine Class, MD;  Location: [facility] ENDOSCOPY;  Service: Endoscopy;  Laterality: N/A;  . ESOPHAGOGASTRODUODENOSCOPY (EGD) WITH PROPOFOL N/A 04/27/2016   Procedure: ESOPHAGOGASTRODUODENOSCOPY (EGD) WITH PROPOFOL;  Surgeon: [name], MD;  Location: [place];  Service: Endoscopy;  Laterality: N/A;  Diabetic - oral meds  . ESOPHAGOGASTRODUODENOSCOPY (EGD) WITH PROPOFOL N/A 01/12/2018   Procedure: ESOPHAGOGASTRODUODENOSCOPY (EGD) WITH PROPOFOL;  Surgeon: Toledo, Benay Pike, MD;  Location: [facility];  Service: Endoscopy;  Laterality:  N/A;  . ESOPHAGOGASTRODUODENOSCOPY (EGD) WITH PROPOFOL N/A 04/11/2019   Procedure: ESOPHAGOGASTRODUODENOSCOPY (EGD) WITH PROPOFOL;  Surgeon: [name], MD;  Location: [facility] ENDOS[facility];  Service: Endoscopy;  Laterality: N/A;  . GASTRIC BYPASS  09/2009   [place] (IM) NAIL INTERTROCHANTERIC Left 10/13/2018   Procedure: INTRAMEDULLARY (IM) NAIL INTERTROCHANTRIC;  Surgeon: Leandrew Koyanagi, MD;  Location: [place];  Service: Orthopedics;  Laterality: Left;  . Left Carotid to sublcavian artery bypass w/ subclavian artery ligation     a. Performed @ [place].  . LEFT HEART CATH AND CORONARY ANGIOGRAPHY Left 06/29/2017   Procedure: LEFT HEART CATH AND CORONARY ANGIOGRAPHY;  Surgeon: Wellington Hampshire, MD;  Location: [place] CV LAB;  Service: Cardiovascular;  Laterality: Left;  . LEFT HEART CATH AND CORONARY ANGIOGRAPHY N/A 09/20/2017   Procedure: LEFT HEART CATH AND CORONARY ANGIOGRAPHY;  Surgeon: Wellington Hampshire, MD;  Location: [place] CV LAB;  Service: Cardiovascular;  Laterality: N/A;  . LEFT HEART CATH AND CORONARY ANGIOGRAPHY N/A 12/20/2017   Procedure: LEFT HEART CATH AND CORONARY ANGIOGRAPHY;  Surgeon: Wellington Hampshire, MD;  Location: [place] CV LAB;  Service: Cardiovascular;  Laterality: N/A;  . LEFT HEART CATH AND CORONARY ANGIOGRAPHY  N/A 04/20/2019   Procedure: LEFT HEART CATH AND CORONARY ANGIOGRAPHY possible PCI;  Surgeon: Minna Merritts, MD;  Location: [place] CV LAB;  Service: Cardiovascular;  Laterality: N/A;  . MELANOMA EXCISION  2016   Dr. Evorn Gong  . [place]  2002  . RIGHT OOPHORECTOMY    . SHOULDER ARTHROSCOPY WITH OPEN ROTATOR CUFF REPAIR Right 01/07/2016   Procedure: SHOULDER ARTHROSCOPY WITH DEBRIDMENT, SUBACHROMIAL DECOMPRESSION;  Surgeon: Corky Mull, MD;  Location: [facility];  Service: Orthopedics;  Laterality: Right;  . SHOULDER ARTHROSCOPY WITH OPEN ROTATOR CUFF REPAIR Right 03/16/2017   Procedure: SHOULDER  ARTHROSCOPY WITH OPEN ROTATOR CUFF REPAIR POSSIBLE BICEPS TENODESIS;  Surgeon: Corky Mull, MD;  Location: [facility];  Service: Orthopedics;  Laterality: Right;  . TRIGGER FINGER RELEASE Right     [place]     Home Medications:  Prior to Admission medications   Medication Sig Start Date End Date Taking? Authorizing Provider  acarbose (PRECOSE) 25 MG tablet Take 1 tablet by mouth 3 (three) times daily before meals. 09/15/19  Yes [provider]  ALPRAZolam Duanne Moron) 1 MG tablet Take 1 tablet by mouth twice daily as needed Patient taking differently: Take 1 mg by mouth 2 (two) times daily as needed for anxiety.  08/14/19  Yes [name], MD  aspirin 81 MG EC tablet Take 81 mg by mouth daily.    Yes [provider]  buPROPion (WELLBUTRIN XL) 300 MG 24 hr tablet Take 1 tablet (300 mg total) by mouth daily. 03/15/19  Yes [name], MD  carvedilol (COREG) 6.25 MG tablet Take 1 tablet (6.25 mg total) by mouth 2 (two) times daily with a meal. 09/12/19 10/14/19 Yes Alma Friendly, MD  clopidogrel (PLAVIX) 75 MG tablet Take 1 tablet (75 mg total) by mouth daily. 10/10/19 11/09/19 Yes Gollan, Kathlene November, MD  diphenoxylate-atropine (LOMOTIL) 2.5-0.025 MG tablet TAKE 1 TABLET BY MOUTH ONCE TO 4 TIMES DAILY FOR DIARRHEA 09/25/19  Yes [name], MD  EUTHYROX 25 MCG tablet TAKE 1 TABLET BY MOUTH ONCE DAILY BEFORE BREAKFAST Patient taking differently: Take 25 mcg by mouth daily before breakfast.  09/19/19  Yes [name], MD  gabapentin (NEURONTIN) 300 MG capsule Take 1 capsule (300 mg total) by mouth 2 (two) times daily. 10/12/18  Yes [name], MD  isosorbide mononitrate (IMDUR) 30 MG 24 hr tablet Take 3 tablets (90 mg total) by mouth daily. 09/13/19 10/14/19 Yes Alma Friendly, MD  nitroGLYCERIN (NITROSTAT) 0.4 MG SL tablet Place 1 tablet (0.4 mg total) under the tongue every 5 (five) minutes as needed for chest pain. 04/24/19  Yes Gollan, Kathlene November, MD    pantoprazole (PROTONIX) 40 MG tablet Take 1 tablet by mouth twice daily Patient taking differently: Take 40 mg by mouth 2 (two) times daily.  06/06/19  Yes [name], MD  promethazine (PHENERGAN) 25 MG tablet Take 25 mg by mouth every 6 (six) hours as needed for nausea or vomiting.  07/25/19  Yes [provider]  propranolol ER (INDERAL LA) 60 MG 24 hr capsule Take 60 mg by mouth at bedtime. 10/04/19  Yes [provider]  rosuvastatin (CRESTOR) 5 MG tablet Take 1 tablet (5 mg total) by mouth daily at 6 PM. Patient taking differently: Take 5 mg by mouth daily.  10/10/19 11/09/19 Yes Gollan, Kathlene November, MD  traZODone (DESYREL) 100 MG tablet Take 100-200 mg by mouth at bedtime as needed for sleep.  03/06/19  Yes [provider]  UBRELVY 100 MG TABS Take 1 tablet by mouth 2 (two) times daily as needed. 09/13/19  Yes [provider]  venlafaxine XR (EFFEXOR-XR) 75 MG 24 hr capsule Take 2 capsules (150 mg total) by mouth daily with breakfast. 08/22/19  Yes [name], MD    Inpatient Medications: Scheduled Meds: . acarbose  25 mg Oral TID AC  . aspirin EC  81 mg Oral Daily  . buPROPion  300 mg Oral Daily  . carvedilol  12.5 mg Oral BID WC  . clopidogrel  75 mg Oral Daily  . enoxaparin (LOVENOX) injection  40 mg Subcutaneous Q24H  . fiber  1 packet Oral TID  . gabapentin  300 mg Oral BID  . isosorbide mononitrate  120 mg Oral Daily  . levothyroxine  25 mcg Oral QAC breakfast  . methocarbamol  500 mg Oral Once  . multivitamin with minerals  1 tablet Oral Daily  . mupirocin ointment  1 application Nasal BID  . pantoprazole  40 mg Oral BID  . Ensure Max Protein  11 oz Oral BID  . rosuvastatin  5 mg Oral Daily  . venlafaxine XR  150 mg Oral Q breakfast   Continuous Infusions: . cefTRIAXone (ROCEPHIN)  IV    . doxycycline (VIBRAMYCIN) IV     PRN Meds: acetaminophen **OR** [DISCONTINUED] acetaminophen, ALPRAZolam, ALPRAZolam, hydrALAZINE, labetalol,  morphine injection, nitroGLYCERIN, ondansetron **OR** ondansetron (ZOFRAN) IV, promethazine, traZODone  Allergies:    Allergies  Allergen Reactions  . Lipitor [Atorvastatin] Other (See Comments)    Leg pains  . Tramadol Other (See Comments)    Mouth feels like it's on fire    Social History:   Social History   Socioeconomic History  . Marital status: Divorced    Spouse name: Not on file  . Number of children: 1  . Years of education: Not on file  . Highest education level: High school graduate  Occupational History  . 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 Disabled through 08/02/2013 per forms 02/03/2013  Tobacco Use  . Smoking status: Former Smoker    Types: Cigarettes    Quit date: 08/31/1994    Years since quitting: 25.1  . Smokeless tobacco: Never Used  . Tobacco comment: quit 28 years ago  Vaping Use  . Vaping Use: Never used  Substance and Sexual Activity  . Alcohol use: No    Alcohol/week: 0.0 standard drinks  . Drug use: No  . Sexual activity: Not Currently    Birth control/protection: None, Surgical    Comment: Ablation  Other Topics Concern  . 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   Lives at home with son   Social Determinants of Health   Financial Resource Strain: Low Risk   . Difficulty of Paying Living Expenses: Not very hard  Food Insecurity: No Food Insecurity  . Worried About Charity fundraiser in the Last Year: Never true  . Ran Out of Food in the Last Year: Never true  Transportation Needs: No Transportation Needs  . Lack of Transportation (Medical): No  . Lack of Transportation (Non-Medical): No  Physical Activity: Inactive  . Days of Exercise per Week: 0 days  . Minutes of Exercise per Session: 0 min  Stress: Stress Concern Present  .  Feeling of Stress  : Very much  Social Connections: Moderately Isolated  . Frequency of Communication with Friends and Family: More than three times a week  . Frequency of Social Gatherings with Friends and Family: More than three times a week  . Attends Religious Services: More than 4 times per year  . Active Member of Clubs or Organizations: No  . Attends Archivist Meetings: Never  . Marital Status: Divorced  Human resources officer Violence: Not At Risk  . Fear of Current or Ex-Partner: No  . Emotionally Abused: No  . Physically Abused: No  . Sexually Abused: No    Family History:    Family History  Problem Relation Age of Onset  . Hypertension Mother   . Anxiety disorder Mother   . Depression Mother   . Bipolar disorder Mother   . Heart disease Mother        No details  . Hyperlipidemia Mother   . Kidney disease Father   . Heart disease Father 40  . Hypertension Father   . Diabetes Father   . Stroke Father   . Colon cancer Father        dx in his 27's  . Anxiety disorder Father   . Depression Father   . Skin cancer Father   . Kidney disease Sister   . Thyroid nodules Sister   . Hypertension Sister   . Hypertension Sister   . Diabetes Sister   . Hyperlipidemia Sister   . Depression Sister   . Breast cancer Maternal Aunt 64  . Breast cancer Maternal Aunt 24  . Ovarian cancer Cousin   . Colon cancer Cousin   . Kidney cancer Cousin   . Breast cancer Other   . Bladder Cancer Neg Hx      ROS:  Please see the history of present illness.   All other ROS reviewed and negative.     Physical Exam/Data:   Vitals:   10/18/19 0118 10/18/19 0411 10/18/19 0419 10/18/19 0820  BP: 136/75 138/71 125/74 (!) 107/56  Pulse: 98 (!) 108 (!) 110 91  Resp: _0 Temp: (!) 100.8 F (38.2 C) (!) 101.1 F (38.4 C) (!) 100.9 F (38.3 C) 98.8 F (37.1 C)  TempSrc: Oral Oral Oral Oral  SpO2: 92% 96% 97% 96%  Weight:      Height:       No intake or output data in the 24 hours ending  10/18/19 1346 Last 3 Weights 10/13/2019 09/28/2019 09/27/2019  Weight (lbs) 151 lb 151 lb 153 lb  Weight (kg) 68.493 kg 68.493 kg 69.4 kg  Some encounter information is confidential and restricted. Go to Review Flowsheets activity to see all data.     Body mass index is 27.62 kg/m.  General:  Well nourished, well developed, in no acute distress HEENT: normal Lymph: no adenopathy Neck: no JVD Endocrine:  No thryomegaly Vascular: No carotid bruits; FA pulses 2+ bilaterally without bruits  Cardiac:  normal S1, S2; RRR; no murmur  Lungs:  clear to auscultation bilaterally, no wheezing, rhonchi or rales  Abd: soft, nontender, no hepatomegaly  Ext: no edema Musculoskeletal:  No deformities, BUE and BLE strength normal and equal, minimal tenderness to sternal palpation Skin: warm and dry  Neuro:  CNs 2-12 intact, no focal abnormalities noted Psych:  Normal affect   EKG:  The EKG was personally reviewed and demonstrates: Sinus tachycardia Telemetry:  Telemetry was personally reviewed and demonstrates: Sinus rhythm  Relevant  CV Studies: Echo 01/2019 1. Left ventricular ejection fraction, by visual estimation, is 60 to  65%. The left ventricle has normal function. There is no left ventricular  hypertrophy.  2. Left ventricular diastolic parameters are consistent with Grade I  diastolic dysfunction (impaired relaxation).  3. The left ventricle has no regional wall motion abnormalities.  4. Global right ventricle has normal systolic function.The right  ventricular size is normal. No increase in right ventricular wall  thickness.  5. Left atrial size was normal.  6. TR signal is inadequate for assessing pulmonary artery systolic  pressure.   Laboratory Data:  High Sensitivity Troponin:   Recent Labs  Lab 10/13/19 2352 10/14/19 0137 10/17/19 1645 10/17/19 2039 10/17/19 2222  TROPONINIHS _0 Chemistry Recent Labs  Lab 10/17/19 0630 10/17/19 2041 10/18/19 0439   NA 138 128* 130*  K 4.5 5.0 4.5  CL 104 96* 94*  CO2 _1 GLUCOSE 85 135* 92  BUN 19 18 25*  CREATININE 1.50* 1.51* 1.70*  CALCIUM 8.7* 8.3* 9.0  GFRNONAA 40* 40* 35*  GFRAA 47* 46* 40*  ANIONGAP _2 Recent Labs  Lab 10/13/19 1300 10/14/19 0137 10/17/19 2041  PROT 7.4 6.5 6.4*  ALBUMIN 3.8 3.3* 3.1*  AST _3 ALT _4 ALKPHOS 93 78 78  BILITOT 0.7 0.6 0.7   Hematology Recent Labs  Lab 10/16/19 0315 10/17/19 2041 10/18/19 0439  WBC 7.6 12.5* 20.5*  RBC 3.76* 3.98 4.33  HGB 10.4* 11.0* 12.0  HCT 29.6* 33.2* 36.2  MCV 78.7* 83.4 83.6  MCH 27.7 27.6 27.7  MCHC 35.1 33.1 33.1  RDW 13.4 13.4 13.5  PLT 140* 134* 163   BNP Recent Labs  Lab 10/17/19 2040  BNP 130.3*    DDimer No results for input(s): DDIMER in the last 168 hours.   Radiology/Studies:  DG Chest 1 View  Result Date: 10/18/2019 CLINICAL DATA:  Chest pain EXAM: CHEST  1 VIEW COMPARISON:  09/08/2019, CT 01/19/2019 FINDINGS: No focal opacity or pleural effusion. Stable cardiomediastinal silhouette with right-sided aortic arch. No pneumothorax. Clips in the upper mediastinum. IMPRESSION: No active disease. Right-sided aortic arch. Electronically Signed   By: Donavan Foil M.D.   On: 10/18/2019 02:52        Assessment and Plan:   1.  Chest pain, chronic stable angina, history of CAD/PCI -EKG and troponins with no evidence for acute ischemia  -Agree with increasing dose of Coreg and Imdur. -Continue aspirin, Plavix, statin. -Monitor symptoms and blood pressure -Last echo normal -No indication for ischemic work-up or invasive testing at this point. -Medical management for CAD as above.  Patient has minimal tenderness to sternal palpation indicating some atypical characteristics.  2.  Hypoglycemia, diabetes -Reason for admission -Management as per primary team  3.  Hypertension -BP controlled -Coreg, Imdur.  4.  Hyperlipidemia -Continue statin.  Signed, Kate Sable, MD  10/18/2019 1:46 PM

## 2019-10-18 NOTE — Telephone Encounter (Signed)
Noted. Updated FYI fwd to Dr. [name].

## 2019-10-18 NOTE — Progress Notes (Deleted)
Pt Complained of chest pain. Nitro given X1 with positive results. Pt has a temp of 102.8. Refusing to take tylenol at this time. [name] Np made aware. Np come to bedside to assess the patient. New orders received. Will cont to monitor pt.

## 2019-10-18 NOTE — Progress Notes (Signed)
PROGRESS NOTE    [name]   BOF:[id number]  DOB: [birth year]  DOA: 10/13/2019     5  PCP: [name], MD  CC: Low glucose  Hospital Course: [name] is a 49 yo CF with PMH CVA, HTN, PUD, DMII, CAD, gastric bypass who presented with persistent hypoglycemia after recent hospitalization on 09/12/19 for similar. She was again found to have elevated levels of C-peptide and previous MRI abdomen was negative for pancreatic pathology. She was placed on a D10 infusion and acarbose was resumed per endocrinology.  Glucose level stabilized and she was able to come off of dextrose infusion and diet was resumed/advanced as tolerated.  She also experienced intermittent CP during hospitalization. She had no ischemic changes noted on EKG and trops were trended intermittently and remained negative. She was previously evaluated by cardiology last hospitalization as well.  She does have underlying disease that was not considered amenable to PCI and she was recommended for ongoing medical management. Due to her ongoing chest pain, cardiology was consulted once again per patient request as well.  She was continued on medical management with increase in her Coreg and Imdur.   Interval History:  Patient continuing to have intermittent chest pain.  She does state that morphine helps some.  Glucose levels stabilized at least it seems. She also developed a fever overnight.  Denies any cough, shortness of breath.  Denies any abdominal pain, nausea, vomiting, diarrhea.  Old records reviewed in assessment of this patient  ROS: Constitutional: negative for anorexia, chills and fevers, Respiratory: negative for cough, Cardiovascular: positive for chest pain and Gastrointestinal: negative for abdominal pain  Assessment & Plan: CAD (coronary artery disease) - followed by cardiology; seen 09/11/19 during hospitalization; last [facility] 04/2019 revealed patent stents in LAD and diagonal with stable severe  ostial to prox D2 disease (unchanged from [facility] in 2019); not amenable to stenting per cardiology. Distal LAD also small and tapering not considered amenable to PCI as well - patient has declined Ranexa in the past; previously imdur was increased to 90 mg with plans for further uptitration to 120 mg if ongoing CP and BP able to sustain - given ongoing CP, will increase Imdur to 120 mg - d/c nitropaste as patient hypotensive some - increase coreg to 12.5 mg BIDWC - hold BP meds if SBP<90 or HR<60 - cardiology consulted given ongoing CP (10/10 on 9/14); agree with med changes  Chronic systolic CHF (congestive heart failure) ([place]) - stable; no s/s exacerbation/decompensation   H/O gastric bypass - patient has persistent hypoglycemia s/p gastric bypass - d/c dextrose infusion and monitor; CBGs have been stable - continue acarbose  - encourage diet  Depression with anxiety -Continue bupropion, Xanax, trazodone, venlafaxine  Essential hypertension - see CAD - will adjust as necessary  GERD (gastroesophageal reflux disease) -Continue PPI  Hypoglycemia -See gastric bypass -Discontinue dextrose infusion and monitor -Continue acarbose  Hypothyroidism -Continue Synthroid  Fever -Patient developed fever overnight of 10/17/2019.  Etiology unclear at this time.  No other obvious symptoms to suggest a source at this time.  CXR performed overnight which was clear.  Procalcitonin mildly elevated.  Patient was started on Rocephin and doxycycline for presumed pulmonary source for now -COVID-19 swab negative on admission, 10/13/2019 -Given prolonged hospitalization, will repeat swab at this time   Antimicrobials: Rocephin 10/17/2019>> present Doxycycline 10/17/2019>> present  DVT prophylaxis: Lovenox Code Status: Full Family Communication: None present Disposition Plan: Status is: Inpatient  Remains inpatient appropriate  because:Unsafe d/c plan, IV treatments appropriate due to intensity of  illness or inability to take PO and Inpatient level of care appropriate due to severity of illness   Dispo: The patient is from: Home              Anticipated d/c is to: Home              Anticipated d/c date is: 2 days              Patient currently is not medically stable to d/c.  Objective: Blood pressure (!) 107/56, pulse 91, temperature 98.8 F (37.1 C), temperature source Oral, resp. rate 16, height 5\' 2"  (1.575 m), weight 68.5 kg, SpO2 96 %.  Examination: General appearance: alert, cooperative and no distress Head: Normocephalic, without obvious abnormality, atraumatic Eyes: EOMI Lungs: clear to auscultation bilaterally Heart: regular rate and rhythm and S1, S2 normal Abdomen: normal findings: bowel sounds normal and soft, non-tender Extremities: No edema Skin: mobility and turgor normal Neurologic: Grossly normal  Consultants:   Cardiology  Procedures:   None  Data Reviewed: I have personally reviewed following labs and imaging studies Results for orders placed or performed during the hospital encounter of 10/13/19 (from the past 24 hour(s))  Troponin I (High Sensitivity)     Status: None   Collection Time: 10/17/19  4:45 PM  Result Value Ref Range   Troponin I (High Sensitivity) 10 <18 ng/L  Glucose, capillary     Status: Abnormal   Collection Time: 10/17/19  8:06 PM  Result Value Ref Range   Glucose-Capillary 150 (H) 70 - 99 mg/dL  Troponin I (High Sensitivity)     Status: None   Collection Time: 10/17/19  8:39 PM  Result Value Ref Range   Troponin I (High Sensitivity) 13 <18 ng/L  Brain natriuretic peptide     Status: Abnormal   Collection Time: 10/17/19  8:40 PM  Result Value Ref Range   B Natriuretic Peptide 130.3 (H) 0.0 - 100.0 pg/mL  Comprehensive metabolic panel     Status: Abnormal   Collection Time: 10/17/19  8:41 PM  Result Value Ref Range   Sodium 128 (L) 135 - 145 mmol/L   Potassium 5.0 3.5 - 5.1 mmol/L   Chloride 96 (L) 98 - 111 mmol/L    CO2 24 22 - 32 mmol/L   Glucose, Bld 135 (H) 70 - 99 mg/dL   BUN 18 6 - 20 mg/dL   Creatinine, Ser 1.51 (H) 0.44 - 1.00 mg/dL   Calcium 8.3 (L) 8.9 - 10.3 mg/dL   Total Protein 6.4 (L) 6.5 - 8.1 g/dL   Albumin 3.1 (L) 3.5 - 5.0 g/dL   AST 26 15 - 41 U/L   ALT 22 0 - 44 U/L   Alkaline Phosphatase 78 38 - 126 U/L   Total Bilirubin 0.7 0.3 - 1.2 mg/dL   GFR calc non Af Amer 40 (L) >60 mL/min   GFR calc Af Amer 46 (L) >60 mL/min   Anion gap 8 5 - 15  CBC with Differential/Platelet     Status: Abnormal   Collection Time: 10/17/19  8:41 PM  Result Value Ref Range   WBC 12.5 (H) 4.0 - 10.5 K/uL   RBC 3.98 3.87 - 5.11 MIL/uL   Hemoglobin 11.0 (L) 12.0 - 15.0 g/dL   HCT 33.2 (L) 36 - 46 %   MCV 83.4 80.0 - 100.0 fL   MCH 27.6 26.0 - 34.0 pg   MCHC 33.1  30.0 - 36.0 g/dL   RDW 13.4 11.5 - 15.5 %   Platelets 134 (L) 150 - 400 K/uL   nRBC 0.0 0.0 - 0.2 %   Neutrophils Relative % 82 %   Neutro Abs 10.3 (H) 1.7 - 7.7 K/uL   Lymphocytes Relative 5 %   Lymphs Abs 0.6 (L) 0.7 - 4.0 K/uL   Monocytes Relative 10 %   Monocytes Absolute 1.2 (H) 0 - 1 K/uL   Eosinophils Relative 1 %   Eosinophils Absolute 0.1 0 - 0 K/uL   Basophils Relative 1 %   Basophils Absolute 0.1 0 - 0 K/uL   Immature Granulocytes 1 %   Abs Immature Granulocytes 0.16 (H) 0.00 - 0.07 K/uL  Magnesium     Status: Abnormal   Collection Time: 10/17/19  8:41 PM  Result Value Ref Range   Magnesium 1.4 (L) 1.7 - 2.4 mg/dL  Lactic acid, plasma     Status: None   Collection Time: 10/17/19  8:44 PM  Result Value Ref Range   Lactic Acid, Venous 1.3 0.5 - 1.9 mmol/L  Troponin I (High Sensitivity)     Status: None   Collection Time: 10/17/19 10:22 PM  Result Value Ref Range   Troponin I (High Sensitivity) 13 <18 ng/L  Procalcitonin     Status: None   Collection Time: 10/17/19 10:22 PM  Result Value Ref Range   Procalcitonin 0.28 ng/mL  Glucose, capillary     Status: None   Collection Time: 10/18/19  4:27 AM  Result Value  Ref Range   Glucose-Capillary 91 70 - 99 mg/dL  Basic metabolic panel     Status: Abnormal   Collection Time: 10/18/19  4:39 AM  Result Value Ref Range   Sodium 130 (L) 135 - 145 mmol/L   Potassium 4.5 3.5 - 5.1 mmol/L   Chloride 94 (L) 98 - 111 mmol/L   CO2 24 22 - 32 mmol/L   Glucose, Bld 92 70 - 99 mg/dL   BUN 25 (H) 6 - 20 mg/dL   Creatinine, Ser 1.70 (H) 0.44 - 1.00 mg/dL   Calcium 9.0 8.9 - 10.3 mg/dL   GFR calc non Af Amer 35 (L) >60 mL/min   GFR calc Af Amer 40 (L) >60 mL/min   Anion gap 12 5 - 15  CBC with Differential/Platelet     Status: Abnormal   Collection Time: 10/18/19  4:39 AM  Result Value Ref Range   WBC 20.5 (H) 4.0 - 10.5 K/uL   RBC 4.33 3.87 - 5.11 MIL/uL   Hemoglobin 12.0 12.0 - 15.0 g/dL   HCT 36.2 36 - 46 %   MCV 83.6 80.0 - 100.0 fL   MCH 27.7 26.0 - 34.0 pg   MCHC 33.1 30.0 - 36.0 g/dL   RDW 13.5 11.5 - 15.5 %   Platelets 163 150 - 400 K/uL   nRBC 0.0 0.0 - 0.2 %   Neutrophils Relative % 84 %   Neutro Abs 17.5 (H) 1.7 - 7.7 K/uL   Lymphocytes Relative 5 %   Lymphs Abs 0.9 0.7 - 4.0 K/uL   Monocytes Relative 9 %   Monocytes Absolute 1.8 (H) 0 - 1 K/uL   Eosinophils Relative 0 %   Eosinophils Absolute 0.0 0 - 0 K/uL   Basophils Relative 1 %   Basophils Absolute 0.1 0 - 0 K/uL   Immature Granulocytes 1 %   Abs Immature Granulocytes 0.17 (H) 0.00 - 0.07 K/uL  Magnesium  Status: Abnormal   Collection Time: 10/18/19  4:39 AM  Result Value Ref Range   Magnesium 2.6 (H) 1.7 - 2.4 mg/dL  Fibrin derivatives D-Dimer ([facility] only)     Status: None   Collection Time: 10/18/19  5:23 AM  Result Value Ref Range   Fibrin derivatives D-dimer ([facility]) 429.22 0.00 - 499.00 ng/mL (FEU)  TSH     Status: None   Collection Time: 10/18/19  5:23 AM  Result Value Ref Range   TSH 1.544 0.350 - 4.500 uIU/mL  Glucose, capillary     Status: None   Collection Time: 10/18/19  7:30 AM  Result Value Ref Range   Glucose-Capillary 97 70 - 99 mg/dL  Urinalysis, Complete w  Microscopic     Status: Abnormal   Collection Time: 10/18/19  8:26 AM  Result Value Ref Range   Color, Urine YELLOW (A) YELLOW   APPearance HAZY (A) CLEAR   Specific Gravity, Urine 1.013 1.005 - 1.030   pH 5.0 5.0 - 8.0   Glucose, UA NEGATIVE NEGATIVE mg/dL   Hgb urine dipstick NEGATIVE NEGATIVE   Bilirubin Urine NEGATIVE NEGATIVE   Ketones, ur NEGATIVE NEGATIVE mg/dL   Protein, ur NEGATIVE NEGATIVE mg/dL   Nitrite NEGATIVE NEGATIVE   Leukocytes,Ua LARGE (A) NEGATIVE   RBC / HPF 6-10 0 - 5 RBC/hpf   WBC, UA 21-50 0 - 5 WBC/hpf   Bacteria, UA FEW (A) NONE SEEN   Squamous Epithelial / LPF NONE SEEN 0 - 5  Glucose, capillary     Status: None   Collection Time: 10/18/19 11:36 AM  Result Value Ref Range   Glucose-Capillary 98 70 - 99 mg/dL   *Note: Due to a large number of results and/or encounters for the requested time period, some results have not been displayed. A complete set of results can be found in Results Review.    Recent Results (from the past 240 hour(s))  SARS Coronavirus 2 by RT PCR (hospital order, performed in [facility] hospital lab) Nasopharyngeal Nasopharyngeal Swab     Status: None   Collection Time: 10/13/19  7:34 PM   Specimen: Nasopharyngeal Swab  Result Value Ref Range Status   SARS Coronavirus 2 NEGATIVE NEGATIVE Final    Comment: (NOTE) SARS-CoV-2 target nucleic acids are NOT DETECTED.  The SARS-CoV-2 RNA is generally detectable in upper and lower respiratory specimens during the acute phase of infection. The lowest concentration of SARS-CoV-2 viral copies this assay can detect is 250 copies / mL. A negative result does not preclude SARS-CoV-2 infection and should not be used as the sole basis for treatment or other patient management decisions.  A negative result may occur with improper specimen collection / handling, submission of specimen other than nasopharyngeal swab, presence of viral mutation(s) within the areas targeted by this assay, and  inadequate number of viral copies (<250 copies / mL). A negative result must be combined with clinical observations, patient history, and epidemiological information.  Fact Sheet for Patients:   StrictlyIdeas.no  Fact Sheet for Healthcare Providers: BankingDealers.co.za  This test is not yet approved or  cleared by the [place] FDA and has been authorized for detection and/or diagnosis of SARS-CoV-2 by FDA under an Emergency Use Authorization (EUA).  This EUA will remain in effect (meaning this test can be used) for the duration of the COVID-19 declaration under Section 564(b)(1) of the Act, 21 U.S.C. section 360bbb-3(b)(1), unless the authorization is terminated or revoked sooner.  Performed at [facility], [phone]  [address] [place], [place] [address]   MRSA PCR Screening     Status: Abnormal   Collection Time: 10/14/19  1:08 AM   Specimen: Nasopharyngeal  Result Value Ref Range Status   MRSA by PCR POSITIVE (A) NEGATIVE Final    Comment:        The GeneXpert MRSA Assay (FDA approved for NASAL specimens only), is one component of a comprehensive MRSA colonization surveillance program. It is not intended to diagnose MRSA infection nor to guide or monitor treatment for MRSA infections. RESULT CALLED TO, READ BACK BY AND VERIFIED WITH: [facility] CAMPBELL RN [phone] 10/14/19 HNM Performed at [facility], [address], [place], [place] [address]      Radiology Studies: DG Chest 1 View  Result Date: 10/18/2019 CLINICAL DATA:  Chest pain EXAM: CHEST  1 VIEW COMPARISON:  09/08/2019, CT 01/19/2019 FINDINGS: No focal opacity or pleural effusion. Stable cardiomediastinal silhouette with right-sided aortic arch. No pneumothorax. Clips in the upper mediastinum. IMPRESSION: No active disease. Right-sided aortic arch. Electronically Signed   By: Donavan Foil M.D.   On: 10/18/2019 02:52   DG Chest 1 View  Final Result       Scheduled Meds: . acarbose  25 mg Oral TID AC  . aspirin EC  81 mg Oral Daily  . buPROPion  300 mg Oral Daily  . carvedilol  12.5 mg Oral BID WC  . clopidogrel  75 mg Oral Daily  . enoxaparin (LOVENOX) injection  40 mg Subcutaneous Q24H  . fiber  1 packet Oral TID  . gabapentin  300 mg Oral BID  . isosorbide mononitrate  120 mg Oral Daily  . levothyroxine  25 mcg Oral QAC breakfast  . methocarbamol  500 mg Oral Once  . multivitamin with minerals  1 tablet Oral Daily  . mupirocin ointment  1 application Nasal BID  . pantoprazole  40 mg Oral BID  . Ensure Max Protein  11 oz Oral BID  . rosuvastatin  5 mg Oral Daily  . venlafaxine XR  150 mg Oral Q breakfast   PRN Meds: acetaminophen **OR** [DISCONTINUED] acetaminophen, ALPRAZolam, ALPRAZolam, hydrALAZINE, labetalol, morphine injection, nitroGLYCERIN, ondansetron **OR** ondansetron (ZOFRAN) IV, promethazine, traZODone Continuous Infusions: . cefTRIAXone (ROCEPHIN)  IV    . doxycycline (VIBRAMYCIN) IV        LOS: 5 days  Time spent: Greater than 50% of the 35 minute visit was spent in counseling/coordination of care for the patient as laid out in the A&P.   Dwyane Dee, MD [facility] 10/18/2019, 4:16 PM  Contact via secure chat.  To contact the attending provider between 7A-7P or the covering provider during after hours 7P-7A, please log into the web site [email or web address] and access using universal [place] password for that web site. If you do not have the password, please call the hospital operator.

## 2019-10-18 NOTE — Progress Notes (Addendum)
Pt Complained of chest pain. Nitro given X1 with positive results. Pt has a temp of 102.8. Refusing to take tylenol at this time. [name] Np made aware. Np come to bedside to assess the patient. New orders received. Will cont to monitor pt.     10/17/19 2049  Assess: MEWS Score  Temp (!) 102.8 F (39.3 C)  BP (!) 156/84  Pulse Rate 97  Resp 18  SpO2 100 %  O2 Device Room Air  Assess: MEWS Score  MEWS Temp 2  MEWS Systolic 0  MEWS Pulse 0  MEWS RR 0  MEWS LOC 0  MEWS Score 2  MEWS Score Color Yellow  Assess: if the MEWS score is Yellow or Red  Were vital signs taken at a resting state? Yes  Focused Assessment No change from prior assessment  Early Detection of Sepsis Score *See Row Information* High  MEWS guidelines implemented *See Row Information* Yes  Treat  MEWS Interventions Administered prn meds/treatments  Pain Scale 0-10  Pain Score 8  Pain Type Acute pain  Pain Location Chest  Pain Descriptors / Indicators Aching  Pain Frequency Constant  Pain Onset On-going  Pain Intervention(s) Medication (See eMAR)  Take Vital Signs  Increase Vital Sign Frequency  Yellow: Q 2hr X 2 then Q 4hr X 2, if remains yellow, continue Q 4hrs  Escalate  MEWS: Escalate Yellow: discuss with charge nurse/RN and consider discussing with provider and RRT  Notify: Charge Nurse/RN  Name of Charge Nurse/RN Notified Estill Bamberg RN  Date Charge Nurse/RN Notified 10/17/19  Time Charge Nurse/RN Notified 2050  Notify: Provider  Provider Name/Title [name] NP  Date Provider Notified 10/17/19  Time Provider Notified 2030  Notification Type Page  Notification Reason Other (Comment) (Pt Complaining of chest pain, yellow mews of 2)  Response See new orders  Date of Provider Response 10/17/19  Time of Provider Response 2055  Document  Patient Outcome Stabilized after interventions  Progress note created (see row info) Yes  Pt Complained of chest pain. Nitro given X1 with positive  results. Pt has a temp of 102.8. Refusing to take tylenol at this time. [name] Np made aware. Np come to bedside to assess the patient. New orders received. Will cont to monitor pt.

## 2019-10-18 NOTE — Progress Notes (Signed)
   10/18/19 0419  Vitals  Temp (!) 100.9 F (38.3 C)  Temp Source Oral  BP 125/74  MAP (mmHg) 89  BP Location Left Arm  BP Method Automatic  Patient Position (if appropriate) Lying  Pulse Rate (!) 110  Pulse Rate Source Monitor  Resp 18  MEWS COLOR  MEWS Score Color Yellow  Oxygen Therapy  SpO2 97 %  Pain Assessment  Pain Scale 0-10  Pain Score 0  MEWS Score  MEWS Temp 1  MEWS Systolic 0  MEWS Pulse 1  MEWS RR 0  MEWS LOC 0  MEWS Score 2  Provider Notification  Provider Name/Title [name] NP  Date Provider Notified 10/18/19  Time Provider Notified [phone]  Notification Type Page  Notification Reason  (Temp 100.9, HR 110)  Response See new orders (Waiting for response)  Date of Provider Response 10/18/19  Time of Provider Response [phone]  will continue with prior mews VS. Will con to monitor the pt

## 2019-10-18 NOTE — Progress Notes (Addendum)
Cross cover Note Patient with complaints of chest pain not completely relieved with nitro.  Patietntwith known CAD and chronic angina. This chest pain patient describes as different from her normal and is 10/10 left side of chest radiates around to her back and down her left arm  Additional symptoms includes shortness of breath with short ambulation to and from bathroom in room.  She states she has been drinking a lot of water today Patient has received large amount of fluids with treatment for acute on chronic hypoglycemia.  Sepsis criteria fever, tachycardia leukocytosis  Labs real leukocytosis with neutrophil predominance.  Procalcitonin 0.28. troponins 13 and BNP 130 Lactic acid 1.3  chest xray negative for acute findings  Pending -  blood and urine cultures; d dimer  Although chest  Clear, patient is at most risk for pneumonia and her main complaint is worsening chest pain in which has been determined not to be cardiac related.  She has been started on rocephin and doxycycline  Follow cultures  Mag level noted to be low and replacement ordered. Repeat level ordered

## 2019-10-19 DIAGNOSIS — R079 Chest pain, unspecified: Secondary | ICD-10-CM

## 2019-10-19 DIAGNOSIS — E871 Hypo-osmolality and hyponatremia: Secondary | ICD-10-CM

## 2019-10-19 DIAGNOSIS — F418 Other specified anxiety disorders: Secondary | ICD-10-CM

## 2019-10-19 LAB — BASIC METABOLIC PANEL
Anion gap: 10 (ref 5–15)
BUN: 40 mg/dL — ABNORMAL HIGH (ref 6–20)
CO2: 25 mmol/L (ref 22–32)
Calcium: 8.8 mg/dL — ABNORMAL LOW (ref 8.9–10.3)
Chloride: 92 mmol/L — ABNORMAL LOW (ref 98–111)
Creatinine, Ser: 2.03 mg/dL — ABNORMAL HIGH (ref 0.44–1.00)
GFR calc Af Amer: 32 mL/min — ABNORMAL LOW (ref >60)
GFR calc non Af Amer: 28 mL/min — ABNORMAL LOW (ref >60)
Glucose, Bld: 78 mg/dL (ref 70–99)
Potassium: 4.6 mmol/L (ref 3.5–5.1)
Sodium: 127 mmol/L — ABNORMAL LOW (ref 135–145)

## 2019-10-19 LAB — CBC WITH DIFFERENTIAL/PLATELET
Abs Immature Granulocytes: 0.1 10*3/uL — ABNORMAL HIGH (ref 0.00–0.07)
Basophils Absolute: 0.1 10*3/uL (ref 0.0–0.1)
Basophils Relative: 1 %
Eosinophils Absolute: 0.2 10*3/uL (ref 0.0–0.5)
Eosinophils Relative: 1 %
HCT: 30.7 % — ABNORMAL LOW (ref 36.0–46.0)
Hemoglobin: 10.3 g/dL — ABNORMAL LOW (ref 12.0–15.0)
Immature Granulocytes: 1 %
Lymphocytes Relative: 8 %
Lymphs Abs: 1.3 10*3/uL (ref 0.7–4.0)
MCH: 27.9 pg (ref 26.0–34.0)
MCHC: 33.6 g/dL (ref 30.0–36.0)
MCV: 83.2 fL (ref 80.0–100.0)
Monocytes Absolute: 1.2 10*3/uL — ABNORMAL HIGH (ref 0.1–1.0)
Monocytes Relative: 7 %
Neutro Abs: 14.2 10*3/uL — ABNORMAL HIGH (ref 1.7–7.7)
Neutrophils Relative %: 82 %
Platelets: 140 10*3/uL — ABNORMAL LOW (ref 150–400)
RBC: 3.69 MIL/uL — ABNORMAL LOW (ref 3.87–5.11)
RDW: 13.6 % (ref 11.5–15.5)
WBC: 17 10*3/uL — ABNORMAL HIGH (ref 4.0–10.5)
nRBC: 0 % (ref 0.0–0.2)

## 2019-10-19 LAB — GLUCOSE, CAPILLARY
Glucose-Capillary: 133 mg/dL — ABNORMAL HIGH (ref 70–99)
Glucose-Capillary: 60 mg/dL — ABNORMAL LOW (ref 70–99)
Glucose-Capillary: 69 mg/dL — ABNORMAL LOW (ref 70–99)
Glucose-Capillary: 84 mg/dL (ref 70–99)
Glucose-Capillary: 91 mg/dL (ref 70–99)
Glucose-Capillary: 71 mg/dL (ref 70–99)
Glucose-Capillary: 81 mg/dL (ref 70–99)
Glucose-Capillary: 62 mg/dL — ABNORMAL LOW (ref 70–99)

## 2019-10-19 LAB — MAGNESIUM: Magnesium: 2.4 mg/dL (ref 1.7–2.4)

## 2019-10-19 LAB — PROCALCITONIN: Procalcitonin: 11.04 ng/mL

## 2019-10-19 NOTE — Progress Notes (Signed)
Progress Note  Patient Name: [name] Date of Encounter: 10/19/2019  [facility] Cardiologist: Ida Rogue, MD   Subjective   Reports having chills fever overnight Fevers documented on vitals, up to 102.8 Blood cultures pending No growth in 24 hours Continues to have periodic chest pain, has headache on Nitropaste, took it off.  Not actively having chest pain  Reports poor appetite, does not like food choices, had 4 spoonfuls of oatmeal this morning Requesting regular diet not carb modified  Inpatient Medications    Scheduled Meds: . acarbose  25 mg Oral TID AC  . aspirin EC  81 mg Oral Daily  . buPROPion  300 mg Oral Daily  . carvedilol  12.5 mg Oral BID WC  . clopidogrel  75 mg Oral Daily  . enoxaparin (LOVENOX) injection  40 mg Subcutaneous Q24H  . fiber  1 packet Oral TID  . gabapentin  300 mg Oral BID  . isosorbide mononitrate  120 mg Oral Daily  . levothyroxine  25 mcg Oral QAC breakfast  . methocarbamol  500 mg Oral Once  . multivitamin with minerals  1 tablet Oral Daily  . pantoprazole  40 mg Oral BID  . Ensure Max Protein  11 oz Oral BID  . rosuvastatin  5 mg Oral Daily  . venlafaxine XR  150 mg Oral Q breakfast   Continuous Infusions: . cefTRIAXone (ROCEPHIN)  IV 2 g (10/19/19 1032)  . doxycycline (VIBRAMYCIN) IV 100 mg (10/19/19 1450)   PRN Meds: acetaminophen **OR** [DISCONTINUED] acetaminophen, ALPRAZolam, ALPRAZolam, hydrALAZINE, labetalol, morphine injection, nitroGLYCERIN, ondansetron **OR** ondansetron (ZOFRAN) IV, promethazine, traZODone   Vital Signs    Vitals:   10/18/19 0820 10/18/19 1625 10/18/19 2335 10/19/19 0739  BP: (!) 107/56 110/66 122/65 (!) 160/72  Pulse: 91 94 94 80  Resp: 16 18 19 15   Temp: 98.8 F (37.1 C) (!) 100.7 F (38.2 C) (!) 101.2 F (38.4 C) 99.6 F (37.6 C)  TempSrc: Oral Oral Oral Oral  SpO2: 96% 97% 99% 99%  Weight:      Height:        Intake/Output Summary (Last 24 hours) at 10/19/2019  1455 Last data filed at 10/19/2019 0500 Gross per 24 hour  Intake 501.24 ml  Output 800 ml  Net -298.76 ml   Last 3 Weights 10/13/2019 09/28/2019 09/27/2019  Weight (lbs) 151 lb 151 lb 153 lb  Weight (kg) 68.493 kg 68.493 kg 69.4 kg  Some encounter information is confidential and restricted. Go to Review Flowsheets activity to see all data.      Telemetry    Normal sinus rhythm- Personally Reviewed  ECG     - Personally Reviewed  Physical Exam   GEN: No acute distress.   Neck: No JVD Cardiac: RRR, no murmurs, rubs, or gallops.  Respiratory: Clear to auscultation bilaterally. GI: Soft, nontender, non-distended  MS: No edema; No deformity. Neuro:  Nonfocal  Psych: Normal affect   Labs    High Sensitivity Troponin:   Recent Labs  Lab 10/13/19 2352 10/14/19 0137 10/17/19 1645 10/17/19 2039 10/17/19 2222  TROPONINIHS 8 7 10 13 13       Chemistry Recent Labs  Lab 10/13/19 1300 10/13/19 1300 10/14/19 0137 10/14/19 0618 10/17/19 2041 10/18/19 0439 10/19/19 0602  NA 138   < > 133*   < > 128* 130* 127*  K 4.8   < > 4.1   < > 5.0 4.5 4.6  CL 106   < > 100   < >  96* 94* 92*  CO2 23   < > 23   < > 24 24 25   GLUCOSE 153*   < > 123*   < > 135* 92 78  BUN 22*   < > 19   < > 18 25* 40*  CREATININE 1.76*   < > 1.73*   < > 1.51* 1.70* 2.03*  CALCIUM 8.8*   < > 8.7*   < > 8.3* 9.0 8.8*  PROT 7.4  --  6.5  --  6.4*  --   --   ALBUMIN 3.8  --  3.3*  --  3.1*  --   --   AST 28  --  23  --  26  --   --   ALT 28  --  24  --  22  --   --   ALKPHOS 93  --  78  --  78  --   --   BILITOT 0.7  --  0.6  --  0.7  --   --   GFRNONAA 33*   < > 34*   < > 40* 35* 28*  GFRAA 38*   < > 39*   < > 46* 40* 32*  ANIONGAP 9   < > 10   < > 8 12 10    < > = values in this interval not displayed.     Hematology Recent Labs  Lab 10/17/19 2041 10/18/19 0439 10/19/19 0602  WBC 12.5* 20.5* 17.0*  RBC 3.98 4.33 3.69*  HGB 11.0* 12.0 10.3*  HCT 33.2* 36.2 30.7*  MCV 83.4 83.6 83.2  MCH  27.6 27.7 27.9  MCHC 33.1 33.1 33.6  RDW 13.4 13.5 13.6  PLT 134* 163 140*    BNP Recent Labs  Lab 10/17/19 2040  BNP 130.3*     DDimer No results for input(s): DDIMER in the last 168 hours.   Radiology    DG Chest 1 View  Result Date: 10/18/2019 CLINICAL DATA:  Chest pain EXAM: CHEST  1 VIEW COMPARISON:  09/08/2019, CT 01/19/2019 FINDINGS: No focal opacity or pleural effusion. Stable cardiomediastinal silhouette with right-sided aortic arch. No pneumothorax. Clips in the upper mediastinum. IMPRESSION: No active disease. Right-sided aortic arch. Electronically Signed   By: Donavan Foil M.D.   On: 10/18/2019 02:52    Cardiac Studies     Patient Profile     Hollis Marita [place] [name] is a 49 y.o. female with a hx of CAD/PCI to LAD, D1 who is being seen today for the evaluation of chest pain  Assessment & Plan   Chest pain, chronic stable angina Long history of stuttering chest pain dating back several years, Catheterization by myself March 2021 with chronic stable disease no change from prior catheterization 2 years earlier -Medical management was recommended at that time -This admission EKG unchanged, cardiac enzymes negative, pattern of chest pain similar to prior episodes -No plan for further ischemic work-up at this time  Hypoglycemia Poor p.o. intake, has had chronic weight loss over the past year Suspect component of depression also contributing Reports only ate 4 mouthfuls of oatmeal this morning, would like regular diet not carb restricted  Hypertension Carvedilol, Imdur For the most part has been running 263 up to 785 systolic  Fevers Etiology unclear Blood cultures pending Tested negative for Covid 1 week ago and no respiratory symptoms  Hyponatremia Would liberalize salt intake Currently not on diuretics -Would review all medication, anecdotal reports of pantoprazole causing symptomatic hyponatremia  Long discussion with her concerning various issues  including fever, hypoglycemia, weight loss, anorexia, chest pain, hyponatremia Discussed home situation  Total encounter time more than 35 minutes  Greater than 50% was spent in counseling and coordination of care with the patient   For questions or updates, please contact [place] Please consult [email or web address] for contact info under     Signed, Esmond Plants, MD, Ph.D [facility]

## 2019-10-19 NOTE — Progress Notes (Signed)
Pt BS 60 (0745), juice given, BS 69 (0800), juice given, MD notified

## 2019-10-19 NOTE — Progress Notes (Signed)
Pt's BS 71, RN gave orange juice to pt, Pt drank. Scheduled meds given. Pt denies any further needs at this time, call bell within reach

## 2019-10-19 NOTE — Progress Notes (Signed)
Pt laying in bed on phone, about to order dinner. Pt denies any further needs at this time, call bell within reach

## 2019-10-19 NOTE — Progress Notes (Signed)
Inpatient Diabetes Program Recommendations  AACE/ADA: New Consensus Statement on Inpatient Glycemic Control (2015)  Target Ranges:  Prepandial:   less than 140 mg/dL      Peak postprandial:   less than 180 mg/dL (1-2 hours)      Critically ill patients:  140 - 180 mg/dL   Results for [name], [name] (MRN [id number]) as of 10/19/2019 09:53  Ref. Range 10/18/2019 04:27 10/18/2019 07:30 10/18/2019 11:36 10/18/2019 16:46 10/18/2019 21:01  Glucose-Capillary Latest Ref Range: 70 - 99 mg/dL 91 97 98 100 (H) 89   Results for [name], [name] (MRN [id number]) as of 10/19/2019 09:53  Ref. Range 10/19/2019 07:40 10/19/2019 08:08 10/19/2019 08:24  Glucose-Capillary Latest Ref Range: 70 - 99 mg/dL 60 (L) 69 (L) 133 (H)    Admit with: Hypoglycemia  History: DM, Gastric Bypass  Home DM Meds: Acarbose 25 mg TID with meals  Current Orders: Acarbose 25 mg TID with meals   Endocrinologist: Dr. Ivan Croft, NP with [facility]--last seen 09/22/2019--Was told to continue her Acarbose 25 mg TID with meals and to make sure to incluse protein with all meals    MD- Note CBGs have been fairly stable on the Acarbose alone since admission.  Hypoglycemic this AM.  If this trend continues, may need to reduce the Acarbose to BID dosing instead of TID?  Not sure if you can reduce the dose to 12.5 mg TID (if pharmacy can offer a lower dose)?    --Will follow patient during hospitalization--  Wyn Quaker RN, MSN, CDE Diabetes Coordinator Inpatient Glycemic Control Team Team Pager: [phone] (8a-5p)

## 2019-10-19 NOTE — Progress Notes (Signed)
PROGRESS NOTE    [name]   HCW:[id number]  DOB: 12/05/1970  DOA: 10/13/2019     6  PCP: [name], MD  CC: Low glucose  Hospital Course: [name] is a 49 yo CF with PMH CVA, HTN, PUD, DMII, CAD, gastric bypass who presented with persistent hypoglycemia after recent hospitalization on 09/12/19 for similar. She was again found to have elevated levels of C-peptide and previous MRI abdomen was negative for pancreatic pathology. She was placed on a D10 infusion and acarbose was resumed per endocrinology.  Glucose level stabilized and she was able to come off of dextrose infusion and diet was resumed/advanced as tolerated.  She also experienced intermittent CP during hospitalization. She had no ischemic changes noted on EKG and trops were trended intermittently and remained negative. She was previously evaluated by cardiology last hospitalization as well.  She does have underlying disease that was not considered amenable to PCI and she was recommended for ongoing medical management. Due to her ongoing chest pain, cardiology was consulted once again per patient request as well.  She was continued on medical management with increase in her Coreg and Imdur.  She also developed a fever on 9/14 of unknown etiology.  She was tested on admission for COVID-19 and was negative and underwent repeat testing again on 10/18/2019 when her fever continued.  She had been started on empiric Rocephin and doxycycline.  A CXR was obtained also with onset of her fevers which was unremarkable.  Urinalysis did reveal some leukocyte esterase with few bacteria.  She was continued on empiric antibiotics while awaiting further culture results.   Interval History:  No acute events overnight.  She is still having some intermittent fevers since yesterday but cannot tell.  She is denying any respiratory or urinary symptoms.  She was resting comfortably in bed stating that her chest pain had improved as  well compared to yesterday.  We discussed watching her again overnight and hopeful for going home tomorrow, she was amenable to this plan.  Old records reviewed in assessment of this patient  ROS: Constitutional: negative for anorexia, chills and fevers, Respiratory: negative for cough, Cardiovascular: positive for chest pain and Gastrointestinal: negative for abdominal pain  Assessment & Plan: CAD (coronary artery disease) - followed by cardiology; seen 09/11/19 during hospitalization; last [facility] 04/2019 revealed patent stents in LAD and diagonal with stable severe ostial to prox D2 disease (unchanged from [facility] in 2019); not amenable to stenting per cardiology. Distal LAD also small and tapering not considered amenable to PCI as well - patient has declined Ranexa in the past; previously imdur was increased to 90 mg with plans for further uptitration to 120 mg if ongoing CP and BP able to sustain - given ongoing CP, will increase Imdur to 120 mg - d/c nitropaste as patient hypotensive some - increase coreg to 12.5 mg BIDWC - hold BP meds if SBP<90 or HR<60 - cardiology consulted given ongoing CP (10/10 on 9/14); agree with med changes; greatly appreciate assistance  Chronic systolic CHF (congestive heart failure) ([place]) - stable; no s/s exacerbation/decompensation   H/O gastric bypass - patient has persistent hypoglycemia s/p gastric bypass - d/c dextrose infusion and monitor; CBGs have been stable - continue acarbose  - encourage diet  Depression with anxiety -Continue bupropion, Xanax, trazodone, venlafaxine  Essential hypertension - see CAD - will adjust as necessary  GERD (gastroesophageal reflux disease) -Continue PPI  Hypoglycemia -See gastric bypass -Discontinue dextrose infusion and  monitor -Continue acarbose -Diet liberalized on 10/19/2019  Hypothyroidism -Continue Synthroid  Fever -Patient developed fever overnight of 10/17/2019.  Etiology unclear at this time.  No  other obvious symptoms to suggest a source at this time.  CXR performed overnight which was clear.  Procalcitonin mildly elevated.  Patient was started on Rocephin and doxycycline - UA also showed some LE and few bacteria; follow up urine culture however she has no urinary symptoms -COVID-19 swab negative on admission, 10/13/2019 -Given prolonged hospitalization, will repeat swab at this time: negative swab again on 9/15.  - continue abx and monitor again overnight    Antimicrobials: Rocephin 10/17/2019>> present Doxycycline 10/17/2019>> present  DVT prophylaxis: Lovenox Code Status: Full Family Communication: None present Disposition Plan: Status is: Inpatient  Remains inpatient appropriate because:Unsafe d/c plan, IV treatments appropriate due to intensity of illness or inability to take PO and Inpatient level of care appropriate due to severity of illness   Dispo: The patient is from: Home              Anticipated d/c is to: Home              Anticipated d/c date is: 1 day              Patient currently is not medically stable to d/c.  Objective: Blood pressure (!) 160/72, pulse 80, temperature 99.6 F (37.6 C), temperature source Oral, resp. rate 15, height 5\' 2"  (1.575 m), weight 68.5 kg, SpO2 99 %.  Examination: General appearance: alert, cooperative and no distress Head: Normocephalic, without obvious abnormality, atraumatic Eyes: EOMI Lungs: clear to auscultation bilaterally Heart: regular rate and rhythm and S1, S2 normal Abdomen: normal findings: bowel sounds normal and soft, non-tender Extremities: No edema Skin: mobility and turgor normal Neurologic: Grossly normal  Consultants:   Cardiology  Procedures:   None  Data Reviewed: I have personally reviewed following labs and imaging studies Results for orders placed or performed during the hospital encounter of 10/13/19 (from the past 24 hour(s))  SARS Coronavirus 2 by RT PCR (hospital order, performed in [place] hospital lab) Nasopharyngeal Nasopharyngeal Swab     Status: None   Collection Time: 10/18/19  4:21 PM   Specimen: Nasopharyngeal Swab  Result Value Ref Range   SARS Coronavirus 2 NEGATIVE NEGATIVE  Glucose, capillary     Status: Abnormal   Collection Time: 10/18/19  4:46 PM  Result Value Ref Range   Glucose-Capillary 100 (H) 70 - 99 mg/dL  Glucose, capillary     Status: None   Collection Time: 10/18/19  9:01 PM  Result Value Ref Range   Glucose-Capillary 89 70 - 99 mg/dL  Basic metabolic panel     Status: Abnormal   Collection Time: 10/19/19  6:02 AM  Result Value Ref Range   Sodium 127 (L) 135 - 145 mmol/L   Potassium 4.6 3.5 - 5.1 mmol/L   Chloride 92 (L) 98 - 111 mmol/L   CO2 25 22 - 32 mmol/L   Glucose, Bld 78 70 - 99 mg/dL   BUN 40 (H) 6 - 20 mg/dL   Creatinine, Ser 2.03 (H) 0.44 - 1.00 mg/dL   Calcium 8.8 (L) 8.9 - 10.3 mg/dL   GFR calc non Af Amer 28 (L) >60 mL/min   GFR calc Af Amer 32 (L) >60 mL/min   Anion gap 10 5 - 15  CBC with Differential/Platelet     Status: Abnormal   Collection Time: 10/19/19  6:02 AM  Result Value Ref Range   WBC 17.0 (H) 4.0 - 10.5 K/uL   RBC 3.69 (L) 3.87 - 5.11 MIL/uL   Hemoglobin 10.3 (L) 12.0 - 15.0 g/dL   HCT 30.7 (L) 36 - 46 %   MCV 83.2 80.0 - 100.0 fL   MCH 27.9 26.0 - 34.0 pg   MCHC 33.6 30.0 - 36.0 g/dL   RDW 13.6 11.5 - 15.5 %   Platelets 140 (L) 150 - 400 K/uL   nRBC 0.0 0.0 - 0.2 %   Neutrophils Relative % 82 %   Neutro Abs 14.2 (H) 1.7 - 7.7 K/uL   Lymphocytes Relative 8 %   Lymphs Abs 1.3 0.7 - 4.0 K/uL   Monocytes Relative 7 %   Monocytes Absolute 1.2 (H) 0 - 1 K/uL   Eosinophils Relative 1 %   Eosinophils Absolute 0.2 0 - 0 K/uL   Basophils Relative 1 %   Basophils Absolute 0.1 0 - 0 K/uL   Immature Granulocytes 1 %   Abs Immature Granulocytes 0.10 (H) 0.00 - 0.07 K/uL  Magnesium     Status: None   Collection Time: 10/19/19  6:02 AM  Result Value Ref Range   Magnesium 2.4 1.7 - 2.4 mg/dL    Procalcitonin - Baseline     Status: None   Collection Time: 10/19/19  6:02 AM  Result Value Ref Range   Procalcitonin 11.04 ng/mL  Glucose, capillary     Status: Abnormal   Collection Time: 10/19/19  7:40 AM  Result Value Ref Range   Glucose-Capillary 60 (L) 70 - 99 mg/dL  Glucose, capillary     Status: Abnormal   Collection Time: 10/19/19  8:08 AM  Result Value Ref Range   Glucose-Capillary 69 (L) 70 - 99 mg/dL   Comment 1 Notify RN   Glucose, capillary     Status: Abnormal   Collection Time: 10/19/19  8:24 AM  Result Value Ref Range   Glucose-Capillary 133 (H) 70 - 99 mg/dL   Comment 1 Notify RN   Glucose, capillary     Status: None   Collection Time: 10/19/19 11:44 AM  Result Value Ref Range   Glucose-Capillary 84 70 - 99 mg/dL   Comment 1 Notify RN   Glucose, capillary     Status: None   Collection Time: 10/19/19  2:49 PM  Result Value Ref Range   Glucose-Capillary 91 70 - 99 mg/dL   *Note: Due to a large number of results and/or encounters for the requested time period, some results have not been displayed. A complete set of results can be found in Results Review.    Recent Results (from the past 240 hour(s))  SARS Coronavirus 2 by RT PCR (hospital order, performed in [facility] hospital lab) Nasopharyngeal Nasopharyngeal Swab     Status: None   Collection Time: 10/13/19  7:34 PM   Specimen: Nasopharyngeal Swab  Result Value Ref Range Status   SARS Coronavirus 2 NEGATIVE NEGATIVE Final    Comment: (NOTE) SARS-CoV-2 target nucleic acids are NOT DETECTED.  The SARS-CoV-2 RNA is generally detectable in upper and lower respiratory specimens during the acute phase of infection. The lowest concentration of SARS-CoV-2 viral copies this assay can detect is 250 copies / mL. A negative result does not preclude SARS-CoV-2 infection and should not be used as the sole basis for treatment or other patient management decisions.  A negative result may occur with improper  specimen collection / handling, submission of specimen other than  nasopharyngeal swab, presence of viral mutation(s) within the areas targeted by this assay, and inadequate number of viral copies (<250 copies / mL). A negative result must be combined with clinical observations, patient history, and epidemiological information.  Fact Sheet for Patients:   StrictlyIdeas.no  Fact Sheet for Healthcare Providers: BankingDealers.co.za  This test is not yet approved or  cleared by the [place] FDA and has been authorized for detection and/or diagnosis of SARS-CoV-2 by FDA under an Emergency Use Authorization (EUA).  This EUA will remain in effect (meaning this test can be used) for the duration of the COVID-19 declaration under Section 564(b)(1) of the Act, 21 U.S.C. section 360bbb-3(b)(1), unless the authorization is terminated or revoked sooner.  Performed at [facility], [address] [place], [place] [address]   MRSA PCR Screening     Status: Abnormal   Collection Time: 10/14/19  1:08 AM   Specimen: Nasopharyngeal  Result Value Ref Range Status   MRSA by PCR POSITIVE (A) NEGATIVE Final    Comment:        The GeneXpert MRSA Assay (FDA approved for NASAL specimens only), is one component of a comprehensive MRSA colonization surveillance program. It is not intended to diagnose MRSA infection nor to guide or monitor treatment for MRSA infections. RESULT CALLED TO, READ BACK BY AND VERIFIED WITH: [facility] CAMPBELL RN [phone] 10/14/19 HNM Performed at [facility], [address] [place], [place] [address]   CULTURE, BLOOD (ROUTINE X 2) w Reflex to ID Panel     Status: None (Preliminary result)   Collection Time: 10/18/19  5:23 AM   Specimen: BLOOD  Result Value Ref Range Status   Specimen Description BLOOD RIGHT [facility]  Final   Special Requests   Final    BOTTLES DRAWN AEROBIC AND ANAEROBIC Blood Culture adequate  volume   Culture   Final    NO GROWTH < 24 HOURS Performed at [facility], [address] [place], [place] [address]    Report Status PENDING  Incomplete  CULTURE, BLOOD (ROUTINE X 2) w Reflex to ID Panel     Status: None (Preliminary result)   Collection Time: 10/18/19  5:29 AM   Specimen: BLOOD  Result Value Ref Range Status   Specimen Description BLOOD LEFT AC  Final   Special Requests   Final    BOTTLES DRAWN AEROBIC AND ANAEROBIC Blood Culture adequate volume   Culture   Final    NO GROWTH < 24 HOURS Performed at [facility], [address], [place], [place] [address]    Report Status PENDING  Incomplete  SARS Coronavirus 2 by RT PCR (hospital order, performed in [place] hospital lab) Nasopharyngeal Nasopharyngeal Swab     Status: None   Collection Time: 10/18/19  4:21 PM   Specimen: Nasopharyngeal Swab  Result Value Ref Range Status   SARS Coronavirus 2 NEGATIVE NEGATIVE Final    Comment: (NOTE) SARS-CoV-2 target nucleic acids are NOT DETECTED.  The SARS-CoV-2 RNA is generally detectable in upper and lower respiratory specimens during the acute phase of infection. The lowest concentration of SARS-CoV-2 viral copies this assay can detect is 250 copies / mL. A negative result does not preclude SARS-CoV-2 infection and should not be used as the sole basis for treatment or other patient management decisions.  A negative result may occur with improper specimen collection / handling, submission of specimen other than nasopharyngeal swab, presence of viral mutation(s) within the areas targeted by this  assay, and inadequate number of viral copies (<250 copies / mL). A negative result must be combined with clinical observations, patient history, and epidemiological information.  Fact Sheet for Patients:   StrictlyIdeas.no  Fact Sheet for Healthcare Providers: BankingDealers.co.za  This test is not yet  approved or  cleared by the [place] FDA and has been authorized for detection and/or diagnosis of SARS-CoV-2 by FDA under an Emergency Use Authorization (EUA).  This EUA will remain in effect (meaning this test can be used) for the duration of the COVID-19 declaration under Section 564(b)(1) of the Act, 21 U.S.C. section 360bbb-3(b)(1), unless the authorization is terminated or revoked sooner.  Performed at [facility], [address], [place], [place] [address]      Radiology Studies: DG Chest 1 View  Result Date: 10/18/2019 CLINICAL DATA:  Chest pain EXAM: CHEST  1 VIEW COMPARISON:  09/08/2019, CT 01/19/2019 FINDINGS: No focal opacity or pleural effusion. Stable cardiomediastinal silhouette with right-sided aortic arch. No pneumothorax. Clips in the upper mediastinum. IMPRESSION: No active disease. Right-sided aortic arch. Electronically Signed   By: Donavan Foil M.D.   On: 10/18/2019 02:52   DG Chest 1 View  Final Result      Scheduled Meds: . acarbose  25 mg Oral TID AC  . aspirin EC  81 mg Oral Daily  . buPROPion  300 mg Oral Daily  . carvedilol  12.5 mg Oral BID WC  . clopidogrel  75 mg Oral Daily  . enoxaparin (LOVENOX) injection  40 mg Subcutaneous Q24H  . fiber  1 packet Oral TID  . gabapentin  300 mg Oral BID  . isosorbide mononitrate  120 mg Oral Daily  . levothyroxine  25 mcg Oral QAC breakfast  . methocarbamol  500 mg Oral Once  . multivitamin with minerals  1 tablet Oral Daily  . pantoprazole  40 mg Oral BID  . Ensure Max Protein  11 oz Oral BID  . rosuvastatin  5 mg Oral Daily  . venlafaxine XR  150 mg Oral Q breakfast   PRN Meds: acetaminophen **OR** [DISCONTINUED] acetaminophen, ALPRAZolam, ALPRAZolam, hydrALAZINE, labetalol, morphine injection, nitroGLYCERIN, ondansetron **OR** ondansetron (ZOFRAN) IV, promethazine, traZODone Continuous Infusions: . cefTRIAXone (ROCEPHIN)  IV 2 g (10/19/19 1032)  . doxycycline (VIBRAMYCIN) IV 100 mg  (10/19/19 1450)      LOS: 6 days  Time spent: Greater than 50% of the 35 minute visit was spent in counseling/coordination of care for the patient as laid out in the A&P.   Dwyane Dee, MD [facility] 10/19/2019, 3:26 PM  Contact via secure chat.  To contact the attending provider between 7A-7P or the covering provider during after hours 7P-7A, please log into the web site [email or web address] and access using universal [place] password for that web site. If you do not have the password, please call the hospital operator.

## 2019-10-19 NOTE — Plan of Care (Signed)
  Problem: Clinical Measurements: Goal: Ability to maintain clinical measurements within normal limits will improve Outcome: Progressing Goal: Will remain free from infection Outcome: Progressing Goal: Diagnostic test results will improve Outcome: Progressing Goal: Respiratory complications will improve Outcome: Progressing Goal: Cardiovascular complication will be avoided Outcome: Progressing   Problem: Pain Managment: Goal: General experience of comfort will improve Outcome: Progressing   Problem: Safety: Goal: Ability to remain free from injury will improve Outcome: Progressing   

## 2019-10-19 NOTE — Plan of Care (Signed)
  Problem: Clinical Measurements: Goal: Will remain free from infection Outcome: Progressing Goal: Respiratory complications will improve Outcome: Progressing Goal: Cardiovascular complication will be avoided Outcome: Progressing   Problem: Pain Managment: Goal: General experience of comfort will improve Outcome: Progressing   Problem: Safety: Goal: Ability to remain free from injury will improve Outcome: Progressing   

## 2019-10-20 DIAGNOSIS — R509 Fever, unspecified: Secondary | ICD-10-CM

## 2019-10-20 LAB — GLUCOSE, CAPILLARY
Glucose-Capillary: 79 mg/dL (ref 70–99)
Glucose-Capillary: 141 mg/dL — ABNORMAL HIGH (ref 70–99)
Glucose-Capillary: 87 mg/dL (ref 70–99)

## 2019-10-20 LAB — CBC WITH DIFFERENTIAL/PLATELET
Abs Immature Granulocytes: 0.07 10*3/uL (ref 0.00–0.07)
Basophils Absolute: 0.1 10*3/uL (ref 0.0–0.1)
Basophils Relative: 1 %
Eosinophils Absolute: 0.3 10*3/uL (ref 0.0–0.5)
Eosinophils Relative: 3 %
HCT: 29.7 % — ABNORMAL LOW (ref 36.0–46.0)
Hemoglobin: 10.1 g/dL — ABNORMAL LOW (ref 12.0–15.0)
Immature Granulocytes: 1 %
Lymphocytes Relative: 11 %
Lymphs Abs: 1.2 10*3/uL (ref 0.7–4.0)
MCH: 28.1 pg (ref 26.0–34.0)
MCHC: 34 g/dL (ref 30.0–36.0)
MCV: 82.7 fL (ref 80.0–100.0)
Monocytes Absolute: 0.8 10*3/uL (ref 0.1–1.0)
Monocytes Relative: 8 %
Neutro Abs: 8.2 10*3/uL — ABNORMAL HIGH (ref 1.7–7.7)
Neutrophils Relative %: 76 %
Platelets: 136 10*3/uL — ABNORMAL LOW (ref 150–400)
RBC: 3.59 MIL/uL — ABNORMAL LOW (ref 3.87–5.11)
RDW: 13.5 % (ref 11.5–15.5)
WBC: 10.6 10*3/uL — ABNORMAL HIGH (ref 4.0–10.5)
nRBC: 0 % (ref 0.0–0.2)

## 2019-10-20 LAB — BASIC METABOLIC PANEL
Anion gap: 10 (ref 5–15)
BUN: 35 mg/dL — ABNORMAL HIGH (ref 6–20)
CO2: 26 mmol/L (ref 22–32)
Calcium: 8.7 mg/dL — ABNORMAL LOW (ref 8.9–10.3)
Chloride: 99 mmol/L (ref 98–111)
Creatinine, Ser: 1.85 mg/dL — ABNORMAL HIGH (ref 0.44–1.00)
GFR calc Af Amer: 36 mL/min — ABNORMAL LOW (ref >60)
GFR calc non Af Amer: 31 mL/min — ABNORMAL LOW (ref >60)
Glucose, Bld: 72 mg/dL (ref 70–99)
Potassium: 4.8 mmol/L (ref 3.5–5.1)
Sodium: 135 mmol/L (ref 135–145)

## 2019-10-20 LAB — MAGNESIUM: Magnesium: 2.1 mg/dL (ref 1.7–2.4)

## 2019-10-20 LAB — PROCALCITONIN: Procalcitonin: 6.38 ng/mL

## 2019-10-20 MED ORDER — NITROGLYCERIN 0.4 MG SL SUBL
0.4000 mg | SUBLINGUAL_TABLET | SUBLINGUAL | 2 refills | Status: DC | PRN
Start: 2019-10-20 — End: 2020-10-04

## 2019-10-20 MED ORDER — ISOSORBIDE MONONITRATE ER 120 MG PO TB24
120.0000 mg | ORAL_TABLET | Freq: Every day | ORAL | 3 refills | Status: DC
Start: 2019-10-21 — End: 2020-03-20

## 2019-10-20 MED ORDER — CARVEDILOL 12.5 MG PO TABS: 12.5000 mg | ORAL_TABLET | Freq: Two times a day (BID) | ORAL | 3 refills | Status: DC

## 2019-10-20 MED ORDER — AMOXICILLIN-POT CLAVULANATE 875-125 MG PO TABS: 1.0000 | ORAL_TABLET | Freq: Two times a day (BID) | ORAL | 0 refills | Status: AC

## 2019-10-20 NOTE — Discharge Summary (Addendum)
Physician Discharge Summary  [place] NFA:[id number] DOB: 09/07/1970 DOA: 10/13/2019  PCP: [name], MD  Admit date: 10/13/2019 Discharge date: 10/20/2019  Admitted From: Home Disposition: Home Discharging physician: Dwyane Dee, MD  Recommendations for Outpatient Follow-up:  1. Follow-up with cardiology.  Follow-up blood pressure and adjust medications as needed  Patient discharged to homes in Discharge Condition: stable CODE STATUS: Full Diet recommendation:  Diet Orders (From admission, onward)    Start     Ordered   10/20/19 0000  Diet - low sodium heart healthy        10/20/19 1100   10/19/19 1503  Diet regular Room service appropriate? Yes; Fluid consistency: Thin  Diet effective now       Question Answer Comment  Room service appropriate? Yes   Fluid consistency: Thin      10/19/19 1503          Hospital Course: [name] is a 49 yo CF with PMH CVA, HTN, PUD, DMII, CAD, gastric bypass who presented with persistent hypoglycemia after recent hospitalization on 09/12/19 for similar. She was again found to have elevated levels of C-peptide and previous MRI abdomen was negative for pancreatic pathology. She was placed on a D10 infusion and acarbose was resumed per endocrinology.  Glucose level stabilized and she was able to come off of dextrose infusion and diet was resumed/advanced as tolerated.  She also experienced intermittent CP during hospitalization. She had no ischemic changes noted on EKG and trops were trended intermittently and remained negative. She was previously evaluated by cardiology last hospitalization as well.  She does have underlying disease that was not considered amenable to PCI and she was recommended for ongoing medical management. Due to her ongoing chest pain, cardiology was consulted once again per patient request as well.  She was continued on medical management with increase in her Coreg and Imdur.  She also developed a  fever on 9/14 of unknown etiology.  She was tested on admission for COVID-19 and was negative and underwent repeat testing again on 10/18/2019 when her fever continued.  She had been started on empiric Rocephin and doxycycline.  A CXR was obtained also with onset of her fevers which was unremarkable.  Urinalysis did reveal some leukocyte esterase with few bacteria.  She was continued on empiric antibiotics while awaiting further culture results. Her fevers defervesced and no obvious source had revealed itself.  WBC also continued to downtrend.  She remained overall asymptomatic and clinically improved with further monitoring.  She was continued on an empiric course of Augmentin to complete at discharge.   CAD (coronary artery disease) - followed by cardiology; seen 09/11/19 during hospitalization; last [facility] 04/2019 revealed patent stents in LAD and diagonal with stable severe ostial to prox D2 disease (unchanged from [facility] in 2019); not amenable to stenting per cardiology. Distal LAD also small and tapering not considered amenable to PCI as well - patient has declined Ranexa in the past; previously imdur was increased to 90 mg with plans for further uptitration to 120 mg if ongoing CP and BP able to sustain - given ongoing CP, will increase Imdur to 120 mg - d/c nitropaste as patient hypotensive some - increase coreg to 12.5 mg BIDWC - hold BP meds if SBP<90 or HR<60 - cardiology consulted given ongoing CP (10/10 on 9/14); agree with med changes; greatly appreciate assistance -Overall, patient has tolerated medication changes and chest pain has been well controlled over the last 24 to  48 hours.  She will follow up outpatient with cardiology -Prescriptions sent to her pharmacy including refills of sublingual nitroglycerin  Chronic systolic CHF (congestive heart failure) ([place]) - stable; no s/s exacerbation/decompensation   H/O gastric bypass - patient has persistent hypoglycemia s/p gastric bypass - d/c  dextrose infusion and monitor; CBGs have been stable - continue acarbose  - encourage diet  Depression with anxiety -Continue bupropion, Xanax, trazodone, venlafaxine  Essential hypertension - see CAD - will adjust as necessary  GERD (gastroesophageal reflux disease) -Continue PPI  Hypoglycemia -See gastric bypass -Discontinue dextrose infusion and monitor -Continue acarbose -Diet liberalized on 10/19/2019  Hypothyroidism -Continue Synthroid  Fever -Patient developed fever overnight of 10/17/2019.  Etiology unclear at this time.  No other obvious symptoms to suggest a source at this time.  CXR performed overnight which was clear.  Procalcitonin mildly elevated.  Patient was started on Rocephin and doxycycline - UA also showed some LE and few bacteria; follow up urine culture however she has no urinary symptoms -COVID-19 swab negative on admission, 10/13/2019 -Given prolonged hospitalization, will repeat swab at this time: negative swab again on 9/15.  -Fevers defervesced and no obvious source revealed itself -Will complete Augmentin course at discharge SEPSIS RULED OUT   The patient's chronic medical conditions were treated accordingly per the patient's home medication regimen except as noted.  On day of discharge, patient was felt deemed stable for discharge. Patient/family member advised to call PCP or come back to ER if needed.   Discharge Diagnoses:   Principal Diagnosis: Hypoglycemia  Active Hospital Problems   Diagnosis Date Noted   Hypoglycemia     Priority: High   CAD (coronary artery disease)     Priority: High   H/O gastric bypass 03/02/2018    Priority: Medium   Chronic systolic CHF (congestive heart failure) ([place])    Hypothyroidism 12/30/2013   GERD (gastroesophageal reflux disease)    Depression with anxiety 05/01/2008   Essential hypertension 05/01/2008    Resolved Hospital Problems   Diagnosis Date Noted Date Resolved   Fever 10/18/2019  10/20/2019    Priority: Medium    Discharge Instructions    Diet - low sodium heart healthy   Complete by: As directed    Increase activity slowly   Complete by: As directed      Allergies as of 10/20/2019      Reactions   Lipitor [atorvastatin] Other (See Comments)   Leg pains   Tramadol Other (See Comments)   Mouth feels like it's on fire      Medication List    STOP taking these medications   diphenoxylate-atropine 2.5-0.025 MG tablet Commonly known as: LOMOTIL   propranolol ER 60 MG 24 hr capsule Commonly known as: INDERAL LA     TAKE these medications   acarbose 25 MG tablet Commonly known as: PRECOSE Take 1 tablet by mouth 3 (three) times daily before meals.   ALPRAZolam 1 MG tablet Commonly known as: XANAX Take 1 tablet by mouth twice daily as needed What changed: reasons to take this   amoxicillin-clavulanate 875-125 MG tablet Commonly known as: Augmentin Take 1 tablet by mouth every 12 (twelve) hours for 2 days. Start taking on: October 21, 2019   aspirin 81 MG EC tablet Take 81 mg by mouth daily.   buPROPion 300 MG 24 hr tablet Commonly known as: WELLBUTRIN XL Take 1 tablet (300 mg total) by mouth daily.   carvedilol 12.5 MG tablet Commonly known as: COREG Take  1 tablet (12.5 mg total) by mouth 2 (two) times daily with a meal. What changed:   medication strength  how much to take   clopidogrel 75 MG tablet Commonly known as: PLAVIX Take 1 tablet (75 mg total) by mouth daily.   Euthyrox 25 MCG tablet Generic drug: levothyroxine TAKE 1 TABLET BY MOUTH ONCE DAILY BEFORE BREAKFAST What changed: See the new instructions.   gabapentin 300 MG capsule Commonly known as: NEURONTIN Take 1 capsule (300 mg total) by mouth 2 (two) times daily.   isosorbide mononitrate 120 MG 24 hr tablet Commonly known as: IMDUR Take 1 tablet (120 mg total) by mouth daily. Start taking on: October 21, 2019 What changed:   medication strength  how much  to take   nitroGLYCERIN 0.4 MG SL tablet Commonly known as: NITROSTAT Place 1 tablet (0.4 mg total) under the tongue every 5 (five) minutes as needed for chest pain.   pantoprazole 40 MG tablet Commonly known as: PROTONIX Take 1 tablet by mouth twice daily   promethazine 25 MG tablet Commonly known as: PHENERGAN Take 25 mg by mouth every 6 (six) hours as needed for nausea or vomiting.   rosuvastatin 5 MG tablet Commonly known as: CRESTOR Take 1 tablet (5 mg total) by mouth daily at 6 PM. What changed: when to take this   traZODone 100 MG tablet Commonly known as: DESYREL Take 100-200 mg by mouth at bedtime as needed for sleep.   Ubrelvy 100 MG Tabs Generic drug: Ubrogepant Take 1 tablet by mouth 2 (two) times daily as needed.   venlafaxine XR 75 MG 24 hr capsule Commonly known as: EFFEXOR-XR Take 2 capsules (150 mg total) by mouth daily with breakfast.       Allergies  Allergen Reactions   Lipitor [Atorvastatin] Other (See Comments)    Leg pains   Tramadol Other (See Comments)    Mouth feels like it's on fire    Consultations: Cardiology  Discharge Exam: BP (!) 155/83 (BP Location: Left Arm)    Pulse 80    Temp 97.9 F (36.6 C)    Resp 17    Ht 5\' 2"  (1.575 m)    Wt 68.5 kg    SpO2 100%    BMI 27.62 kg/m  General appearance: alert, cooperative and no distress Head: Normocephalic, without obvious abnormality, atraumatic Eyes: EOMI Lungs: clear to auscultation bilaterally Heart: regular rate and rhythm and S1, S2 normal Abdomen: normal findings: bowel sounds normal and soft, non-tender Extremities: No edema Skin: mobility and turgor normal Neurologic: Grossly normal  The results of significant diagnostics from this hospitalization (including imaging, microbiology, ancillary and laboratory) are listed below for reference.   Microbiology: Recent Results (from the past 240 hour(s))  SARS Coronavirus 2 by RT PCR (hospital order, performed in [facility]  hospital lab) Nasopharyngeal Nasopharyngeal Swab     Status: None   Collection Time: 10/13/19  7:34 PM   Specimen: Nasopharyngeal Swab  Result Value Ref Range Status   SARS Coronavirus 2 NEGATIVE NEGATIVE Final    Comment: (NOTE) SARS-CoV-2 target nucleic acids are NOT DETECTED.  The SARS-CoV-2 RNA is generally detectable in upper and lower respiratory specimens during the acute phase of infection. The lowest concentration of SARS-CoV-2 viral copies this assay can detect is 250 copies / mL. A negative result does not preclude SARS-CoV-2 infection and should not be used as the sole basis for treatment or other patient management decisions.  A negative result may occur with improper  specimen collection / handling, submission of specimen other than nasopharyngeal swab, presence of viral mutation(s) within the areas targeted by this assay, and inadequate number of viral copies (<250 copies / mL). A negative result must be combined with clinical observations, patient history, and epidemiological information.  Fact Sheet for Patients:   StrictlyIdeas.no  Fact Sheet for Healthcare Providers: BankingDealers.co.za  This test is not yet approved or  cleared by the [place] FDA and has been authorized for detection and/or diagnosis of SARS-CoV-2 by FDA under an Emergency Use Authorization (EUA).  This EUA will remain in effect (meaning this test can be used) for the duration of the COVID-19 declaration under Section 564(b)(1) of the Act, 21 U.S.C. section 360bbb-3(b)(1), unless the authorization is terminated or revoked sooner.  Performed at [facility], [address] [place], [place] [address]   MRSA PCR Screening     Status: Abnormal   Collection Time: 10/14/19  1:08 AM   Specimen: Nasopharyngeal  Result Value Ref Range Status   MRSA by PCR POSITIVE (A) NEGATIVE Final    Comment:        The GeneXpert MRSA Assay  (FDA approved for NASAL specimens only), is one component of a comprehensive MRSA colonization surveillance program. It is not intended to diagnose MRSA infection nor to guide or monitor treatment for MRSA infections. RESULT CALLED TO, READ BACK BY AND VERIFIED WITH: [facility] CAMPBELL RN [phone] 10/14/19 HNM Performed at [facility], [address] [place], [place] [address]   CULTURE, BLOOD (ROUTINE X 2) w Reflex to ID Panel     Status: None (Preliminary result)   Collection Time: 10/18/19  5:23 AM   Specimen: BLOOD  Result Value Ref Range Status   Specimen Description BLOOD RIGHT [facility]  Final   Special Requests   Final    BOTTLES DRAWN AEROBIC AND ANAEROBIC Blood Culture adequate volume   Culture   Final    NO GROWTH 2 DAYS Performed at [facility], [address], [place], [place] [address]    Report Status PENDING  Incomplete  CULTURE, BLOOD (ROUTINE X 2) w Reflex to ID Panel     Status: None (Preliminary result)   Collection Time: 10/18/19  5:29 AM   Specimen: BLOOD  Result Value Ref Range Status   Specimen Description BLOOD LEFT [facility]  Final   Special Requests   Final    BOTTLES DRAWN AEROBIC AND ANAEROBIC Blood Culture adequate volume   Culture   Final    NO GROWTH 2 DAYS Performed at [name], [address] [place], [place] [address]    Report Status PENDING  Incomplete  SARS Coronavirus 2 by RT PCR (hospital order, performed in [place] hospital lab) Nasopharyngeal Nasopharyngeal Swab     Status: None   Collection Time: 10/18/19  4:21 PM   Specimen: Nasopharyngeal Swab  Result Value Ref Range Status   SARS Coronavirus 2 NEGATIVE NEGATIVE Final    Comment: (NOTE) SARS-CoV-2 target nucleic acids are NOT DETECTED.  The SARS-CoV-2 RNA is generally detectable in upper and lower respiratory specimens during the acute phase of infection. The lowest concentration of SARS-CoV-2 viral copies this assay can detect is 250 copies / mL. A  negative result does not preclude SARS-CoV-2 infection and should not be used as the sole basis for treatment or other patient management decisions.  A negative result may occur with improper specimen collection / handling, submission of specimen other than nasopharyngeal swab, presence of  viral mutation(s) within the areas targeted by this assay, and inadequate number of viral copies (<250 copies / mL). A negative result must be combined with clinical observations, patient history, and epidemiological information.  Fact Sheet for Patients:   StrictlyIdeas.no  Fact Sheet for Healthcare Providers: BankingDealers.co.za  This test is not yet approved or  cleared by the [place] FDA and has been authorized for detection and/or diagnosis of SARS-CoV-2 by FDA under an Emergency Use Authorization (EUA).  This EUA will remain in effect (meaning this test can be used) for the duration of the COVID-19 declaration under Section 564(b)(1) of the Act, 21 U.S.C. section 360bbb-3(b)(1), unless the authorization is terminated or revoked sooner.  Performed at [facility], [address] [place],  [address]      Labs: BNP (last 3 results) Recent Labs    01/27/19 2107 04/18/19 0910 10/17/19 2040  BNP 40.0 114.0* 382.5*   Basic Metabolic Panel: Recent Labs  Lab 10/17/19 0630 10/17/19 2041 10/18/19 0439 10/19/19 0602 10/20/19 0428  NA 138 128* 130* 127* 135  K 4.5 5.0 4.5 4.6 4.8  CL 104 96* 94* 92* 99  CO2 25 24 24 25 26   GLUCOSE 85 135* 92 78 72  BUN 19 18 25* 40* 35*  CREATININE 1.50* 1.51* 1.70* 2.03* 1.85*  CALCIUM 8.7* 8.3* 9.0 8.8* 8.7*  MG  --  1.4* 2.6* 2.4 2.1   Liver Function Tests: Recent Labs  Lab 10/14/19 0137 10/17/19 2041  AST 23 26  ALT 24 22  ALKPHOS 78 78  BILITOT 0.6 0.7  PROT 6.5 6.4*  ALBUMIN 3.3* 3.1*   No results for input(s): LIPASE, AMYLASE in the last 168 hours. No results  for input(s): AMMONIA in the last 168 hours. CBC: Recent Labs  Lab 10/16/19 0315 10/17/19 2041 10/18/19 0439 10/19/19 0602 10/20/19 0428  WBC 7.6 12.5* 20.5* 17.0* 10.6*  NEUTROABS  --  10.3* 17.5* 14.2* 8.2*  HGB 10.4* 11.0* 12.0 10.3* 10.1*  HCT 29.6* 33.2* 36.2 30.7* 29.7*  MCV 78.7* 83.4 83.6 83.2 82.7  PLT 140* 134* 163 140* 136*   Cardiac Enzymes: No results for input(s): CKTOTAL, CKMB, CKMBINDEX, TROPONINI in the last 168 hours. BNP: Invalid input(s): POCBNP CBG: Recent Labs  Lab 10/19/19 1626 10/19/19 2111 10/19/19 2224 10/20/19 0804 10/20/19 1206  GLUCAP 71 81 62* 79 141*   D-Dimer No results for input(s): DDIMER in the last 72 hours. Hgb A1c No results for input(s): HGBA1C in the last 72 hours. Lipid Profile No results for input(s): CHOL, HDL, LDLCALC, TRIG, CHOLHDL, LDLDIRECT in the last 72 hours. Thyroid function studies Recent Labs    10/18/19 0523  TSH 1.544   Anemia work up No results for input(s): VITAMINB12, FOLATE, FERRITIN, TIBC, IRON, RETICCTPCT in the last 72 hours. Urinalysis    Component Value Date/Time   COLORURINE YELLOW (A) 10/18/2019 0826   APPEARANCEUR HAZY (A) 10/18/2019 0826   APPEARANCEUR Cloudy (A) 07/17/2014 1455   LABSPEC 1.013 10/18/2019 0826   LABSPEC 1.012 12/29/2013 1210   PHURINE 5.0 10/18/2019 0826   GLUCOSEU NEGATIVE 10/18/2019 0826   GLUCOSEU >=500 12/29/2013 1210   HGBUR NEGATIVE 10/18/2019 0826   HGBUR negative 04/14/2010 1435   BILIRUBINUR NEGATIVE 10/18/2019 0826   BILIRUBINUR small 08/22/2019 1014   BILIRUBINUR Negative 07/17/2014 1455   BILIRUBINUR Negative 12/29/2013 1210   KETONESUR NEGATIVE 10/18/2019 0826   PROTEINUR NEGATIVE 10/18/2019 0826   UROBILINOGEN 0.2 08/22/2019 1014   UROBILINOGEN negative 04/14/2010 1435  NITRITE NEGATIVE 10/18/2019 0826   LEUKOCYTESUR LARGE (A) 10/18/2019 0826   LEUKOCYTESUR Negative 12/29/2013 1210   Sepsis Labs Invalid input(s): PROCALCITONIN,  WBC,   LACTICIDVEN Microbiology Recent Results (from the past 240 hour(s))  SARS Coronavirus 2 by RT PCR (hospital order, performed in [facility] hospital lab) Nasopharyngeal Nasopharyngeal Swab     Status: None   Collection Time: 10/13/19  7:34 PM   Specimen: Nasopharyngeal Swab  Result Value Ref Range Status   SARS Coronavirus 2 NEGATIVE NEGATIVE Final    Comment: (NOTE) SARS-CoV-2 target nucleic acids are NOT DETECTED.  The SARS-CoV-2 RNA is generally detectable in upper and lower respiratory specimens during the acute phase of infection. The lowest concentration of SARS-CoV-2 viral copies this assay can detect is 250 copies / mL. A negative result does not preclude SARS-CoV-2 infection and should not be used as the sole basis for treatment or other patient management decisions.  A negative result may occur with improper specimen collection / handling, submission of specimen other than nasopharyngeal swab, presence of viral mutation(s) within the areas targeted by this assay, and inadequate number of viral copies (<250 copies / mL). A negative result must be combined with clinical observations, patient history, and epidemiological information.  Fact Sheet for Patients:   StrictlyIdeas.no  Fact Sheet for Healthcare Providers: BankingDealers.co.za  This test is not yet approved or  cleared by the [place] FDA and has been authorized for detection and/or diagnosis of SARS-CoV-2 by FDA under an Emergency Use Authorization (EUA).  This EUA will remain in effect (meaning this test can be used) for the duration of the COVID-19 declaration under Section 564(b)(1) of the Act, 21 U.S.C. section 360bbb-3(b)(1), unless the authorization is terminated or revoked sooner.  Performed at [facility], [address] [place], [place] [address]   MRSA PCR Screening     Status: Abnormal   Collection Time: 10/14/19  1:08 AM   Specimen:  Nasopharyngeal  Result Value Ref Range Status   MRSA by PCR POSITIVE (A) NEGATIVE Final    Comment:        The GeneXpert MRSA Assay (FDA approved for NASAL specimens only), is one component of a comprehensive MRSA colonization surveillance program. It is not intended to diagnose MRSA infection nor to guide or monitor treatment for MRSA infections. RESULT CALLED TO, READ BACK BY AND VERIFIED WITH: [facility] CAMPBELL RN [phone] 10/14/19 HNM Performed at [facility], [address] [place], [place] [address]   CULTURE, BLOOD (ROUTINE X 2) w Reflex to ID Panel     Status: None (Preliminary result)   Collection Time: 10/18/19  5:23 AM   Specimen: BLOOD  Result Value Ref Range Status   Specimen Description BLOOD RIGHT [facility]  Final   Special Requests   Final    BOTTLES DRAWN AEROBIC AND ANAEROBIC Blood Culture adequate volume   Culture   Final    NO GROWTH 2 DAYS Performed at [facility], [address], [place], [place] [address]    Report Status PENDING  Incomplete  CULTURE, BLOOD (ROUTINE X 2) w Reflex to ID Panel     Status: None (Preliminary result)   Collection Time: 10/18/19  5:29 AM   Specimen: BLOOD  Result Value Ref Range Status   Specimen Description BLOOD LEFT [facility]  Final   Special Requests   Final    BOTTLES DRAWN AEROBIC AND ANAEROBIC Blood Culture adequate volume   Culture   Final    NO  GROWTH 2 DAYS Performed at [facility], [address] [place], [place] [address]    Report Status PENDING  Incomplete  SARS Coronavirus 2 by RT PCR (hospital order, performed in [facility] hospital lab) Nasopharyngeal Nasopharyngeal Swab     Status: None   Collection Time: 10/18/19  4:21 PM   Specimen: Nasopharyngeal Swab  Result Value Ref Range Status   SARS Coronavirus 2 NEGATIVE NEGATIVE Final    Comment: (NOTE) SARS-CoV-2 target nucleic acids are NOT DETECTED.  The SARS-CoV-2 RNA is generally detectable in upper and lower respiratory specimens  during the acute phase of infection. The lowest concentration of SARS-CoV-2 viral copies this assay can detect is 250 copies / mL. A negative result does not preclude SARS-CoV-2 infection and should not be used as the sole basis for treatment or other patient management decisions.  A negative result may occur with improper specimen collection / handling, submission of specimen other than nasopharyngeal swab, presence of viral mutation(s) within the areas targeted by this assay, and inadequate number of viral copies (<250 copies / mL). A negative result must be combined with clinical observations, patient history, and epidemiological information.  Fact Sheet for Patients:   StrictlyIdeas.no  Fact Sheet for Healthcare Providers: BankingDealers.co.za  This test is not yet approved or  cleared by the [place] FDA and has been authorized for detection and/or diagnosis of SARS-CoV-2 by FDA under an Emergency Use Authorization (EUA).  This EUA will remain in effect (meaning this test can be used) for the duration of the COVID-19 declaration under Section 564(b)(1) of the Act, 21 U.S.C. section 360bbb-3(b)(1), unless the authorization is terminated or revoked sooner.  Performed at [facility], [address] [place], [place] [address]     Procedures/Studies: [place] Chest 1 View  Result Date: 10/18/2019 CLINICAL DATA:  Chest pain EXAM: CHEST  1 VIEW COMPARISON:  09/08/2019, CT 01/19/2019 FINDINGS: No focal opacity or pleural effusion. Stable cardiomediastinal silhouette with right-sided aortic arch. No pneumothorax. Clips in the upper mediastinum. IMPRESSION: No active disease. Right-sided aortic arch. Electronically Signed   By: Donavan Foil M.D.   On: 10/18/2019 02:52     Time coordinating discharge: Over 30 minutes    Dwyane Dee, MD  [facility] 10/20/2019, 3:50 PM Pager: Secure chat  If 7PM-7AM, please  contact night-coverage [email or web address] Password [email or web address]

## 2019-10-20 NOTE — Progress Notes (Signed)
Discharge instructions given to patient.

## 2019-10-20 NOTE — Care Management Important Message (Signed)
Important Message  Patient Details  Name: [name] MRN: [id number] Date of Birth: 1970-04-27   Medicare Important Message Given:  Yes     Dannette Barbara 10/20/2019, 4:00 PM

## 2019-10-21 ENCOUNTER — Other Ambulatory Visit: Payer: Self-pay | Admitting: Family Medicine

## 2019-10-21 NOTE — Telephone Encounter (Signed)
Requested Prescriptions  Pending Prescriptions Disp Refills  . buPROPion (WELLBUTRIN XL) 300 MG 24 hr tablet [Pharmacy Med Name: buPROPion HCl ER (XL) 300 MG Oral Tablet Extended Release 24 Hour] 90 tablet 0    Sig: Take 1 tablet by mouth once daily     Psychiatry: Antidepressants - bupropion Failed - 10/21/2019  9:07 AM      Failed - Last BP in normal range    BP Readings from Last 1 Encounters:  10/20/19 (!) 155/83         Passed - Completed PHQ-2 or PHQ-9 in the last 360 days.      Passed - Valid encounter within last 6 months    Recent Outpatient Visits          3 weeks ago Dysphagia, unspecified type   [facility] [name], MD   2 months ago [place], Donald E, MD   2 months ago Encounter for [employer] annual wellness exam   [place], [place], LPN   2 months ago Sore throat   [place], [place], [place]   5 months ago Chest pain, unspecified type   [place], MD      Future Appointments            In 4 days [place], Kathlene November, MD [facility], [facility]

## 2019-10-23 ENCOUNTER — Telehealth: Payer: Self-pay | Admitting: Family Medicine

## 2019-10-23 ENCOUNTER — Encounter: Payer: Self-pay | Admitting: Family Medicine

## 2019-10-23 ENCOUNTER — Other Ambulatory Visit: Payer: Self-pay

## 2019-10-23 DIAGNOSIS — F321 Major depressive disorder, single episode, moderate: Secondary | ICD-10-CM

## 2019-10-23 LAB — CULTURE, BLOOD (ROUTINE X 2)
Culture: NO GROWTH
Culture: NO GROWTH
Special Requests: ADEQUATE
Special Requests: ADEQUATE

## 2019-10-23 MED ORDER — VENLAFAXINE HCL ER 150 MG PO CP24: 150.0000 mg | ORAL_CAPSULE | Freq: Every day | ORAL | 5 refills | Status: DC

## 2019-10-23 NOTE — Telephone Encounter (Signed)
Home Health Verbal Orders - Caller/Agency: Jeff// [facility] Callback Number: [phone] [id number] Requesting OT/PT/Skilled Nursing/Social Work/Speech Therapy: PT Frequency: Resumption of care visit on 10/24/19. Please advise.

## 2019-10-23 NOTE — Telephone Encounter (Signed)
Please verify if she is taking 2 a day of this. If so, I will change to 150mg  capsule, which would only be one a day.

## 2019-10-23 NOTE — Telephone Encounter (Signed)
Patient sent mychart message requesting refills. She took her last pill this morning.

## 2019-10-24 ENCOUNTER — Ambulatory Visit: Payer: Medicare Other | Admitting: Cardiovascular Disease

## 2019-10-24 ENCOUNTER — Encounter: Payer: Self-pay | Admitting: Family Medicine

## 2019-10-24 NOTE — Telephone Encounter (Signed)
Please review. Thanks!  

## 2019-10-24 NOTE — Telephone Encounter (Signed)
That's fine

## 2019-10-25 ENCOUNTER — Telehealth (INDEPENDENT_AMBULATORY_CARE_PROVIDER_SITE_OTHER): Payer: Medicare Other | Admitting: Cardiovascular Disease

## 2019-10-25 ENCOUNTER — Telehealth: Payer: Self-pay | Admitting: Family Medicine

## 2019-10-25 ENCOUNTER — Encounter: Payer: Self-pay | Admitting: Cardiovascular Disease

## 2019-10-25 VITALS — BP 161/87 | HR 64 | Ht 62.0 in | Wt 145.0 lb

## 2019-10-25 DIAGNOSIS — E1122 Type 2 diabetes mellitus with diabetic chronic kidney disease: Secondary | ICD-10-CM | POA: Diagnosis not present

## 2019-10-25 DIAGNOSIS — I25118 Atherosclerotic heart disease of native coronary artery with other forms of angina pectoris: Secondary | ICD-10-CM

## 2019-10-25 DIAGNOSIS — I739 Peripheral vascular disease, unspecified: Secondary | ICD-10-CM | POA: Diagnosis not present

## 2019-10-25 DIAGNOSIS — I6329 Cerebral infarction due to unspecified occlusion or stenosis of other precerebral arteries: Secondary | ICD-10-CM

## 2019-10-25 DIAGNOSIS — I129 Hypertensive chronic kidney disease with stage 1 through stage 4 chronic kidney disease, or unspecified chronic kidney disease: Secondary | ICD-10-CM | POA: Diagnosis not present

## 2019-10-25 DIAGNOSIS — N183 Chronic kidney disease, stage 3 unspecified: Secondary | ICD-10-CM

## 2019-10-25 DIAGNOSIS — I255 Ischemic cardiomyopathy: Secondary | ICD-10-CM

## 2019-10-25 MED ORDER — ROSUVASTATIN CALCIUM 40 MG PO TABS: 40.0000 mg | ORAL_TABLET | Freq: Every day | ORAL | 3 refills | Status: AC

## 2019-10-25 MED ORDER — AMLODIPINE BESYLATE 2.5 MG PO TABS: 2.5000 mg | ORAL_TABLET | Freq: Every day | ORAL | 3 refills | Status: DC

## 2019-10-25 NOTE — Patient Instructions (Addendum)
Call with some blood pressure numbers  Make sure to check Blood Pressures 2 hours after your medications.   Please keep a log of your readings so we can see how they trend.  Avoid these things for 30 minutes before checking your blood pressure:  Drinking caffeine.  Drinking alcohol.  Eating.  Smoking.  Exercising.  Five minutes before checking your blood pressure:  Pee.  Sit in a dining chair. Avoid sitting in a soft couch or armchair.  Be quiet. Do not talk.   Medication Instructions:  1. Please increase the crestor back to 40 mg daily 2. Please restart amlodipine 2.5 mg daily in the evening   If you need a refill on your cardiac medications before your next appointment, please call your pharmacy.    Lab work: No new labs needed   If you have labs (blood work) drawn today and your tests are completely normal, you will receive your results only by: Marland Kitchen MyChart Message (if you have MyChart) OR . A paper copy in the mail If you have any lab test that is abnormal or we need to change your treatment, we will call you to review the results.   Testing/Procedures: No new testing needed   Follow-Up: At [facility], you and your health needs are our priority.  As part of our continuing mission to provide you with exceptional heart care, we have created designated Provider Care Teams.  These Care Teams include your primary Cardiologist (physician) and Advanced Practice Providers (APPs -  Physician Assistants and Nurse Practitioners) who all work together to provide you with the care you need, when you need it.  . You will need a follow up appointment in 3 months  . Providers on your designated Care Team:   . Murray Hodgkins, NP . Christell Faith, PA-C . Marrianne Mood, PA-C  Any Other Special Instructions Will Be Listed Below (If Applicable).  COVID-19 Vaccine Information can be found at: [email or web address]  For questions related to vaccine distribution or appointments, please email vaccine@ .[email or web address] or call [phone].

## 2019-10-25 NOTE — Telephone Encounter (Unsigned)
Copied from [place] [phone]. Topic: Quick Communication - Home Health Verbal Orders >> Oct 25, 2019  2:46 PM Yvette Rack wrote: Caller/Agency: Jeani Hawking with [facility]  Callback Number: [phone] Option 2 Requesting OT/PT/Skilled Nursing/Social Work/Speech Therapy: PT Frequency: 1 time a week for 1 week and 2 times a week for 2 weeks

## 2019-10-25 NOTE — Progress Notes (Signed)
Virtual Visit via Video Note   This visit type was conducted due to national recommendations for restrictions regarding the COVID-19 Pandemic (e.g. social distancing) in an effort to limit this patient's exposure and mitigate transmission in our community.  Due to her co-morbid illnesses, this patient is at least at moderate risk for complications without adequate follow up.  This format is felt to be most appropriate for this patient at this time.  All issues noted in this document were discussed and addressed.  A limited physical exam was performed with this format.  Please refer to the patient's chart for her consent to telehealth for [facility].   I connected with  [name] on 10/25/19 by a video enabled telemedicine application and verified that I am speaking with the correct person using two identifiers. I am contacting the patient above from our cardiology clinic office or alternate office work station to their home, I discussed the limitations of evaluation and management by telemedicine. The patient expressed understanding and agreed to proceed.   Evaluation Performed:  Follow-up visit  Date:  10/25/2019   ID:  [address], [place] [birth year], MRN [id number]  Patient Location:  [address] [place] [address]   Provider location:   [facility], [place] office  PCP:  [name], MD  Cardiologist:  Arvid Right [facility]  Chief Complaint  Patient presents with  . other    6 month f/u no complaints today. Meds reviewed verbally with pt.    History of Present Illness:    [name] is a 49 y.o. female who presents via audio/video conferencing for a telehealth visit today.   The patient does not symptoms concerning for COVID-19 infection (fever, chills, cough, or new SHORTNESS OF BREATH).   Patient has a past medical history of smoking,  strokes, 2010 chest pain dating back to 2005  CAD, Previous LAD  stent Catheterization May 2017 with stent placed to her first diagonal, there was residual 50% distal LAD disease, 30% proximal, 40% proximal circumflex normal LV function,  s/p gastric bypass surgery, Has no stomach Baseline creatinine 1.5, previous ly taken off her diuretic Labile pressures Who presents for routine followup of her coronary artery disease  In the hospital for hypoglycemia 10/2019 Records reviewed in detail Fever, etiology unclear, blood cultures negative, slightly positive UA but no culture performed Reported having some chest pain before she was in the hospital, while she was in the hospital She was evaluated by cardiology, felt to be atypical in nature  On discussion today she is now home, recovering Still having "some chest pain" Over did it, went shopping, Was SOB, had to stop to catch her breath Not very active at baseline  Declining cardiac rehab  Discussed her medications with her Coreg 12.5 BID imdur 120 mg Daily in th Am Off ARB presumably secondary to labile renal function Off amlodipine in the past secondary to hypotension, but now blood pressure high For some reason only on crestor 5 mg daily, previously on 40 mg daily  Pressure at home on todays visit 161/87,  Pulse 64  Cath 04/2019 discussed with her in detail  Non-stenotic 1st Diag lesion was previously treated.  Ost 1st Diag lesion is 40% stenosed.  Mid LAD-2 lesion is 5% stenosed.  Ost 2nd Diag to 2nd Diag lesion is 95% stenosed.  Mid LAD-1 lesion is 30% stenosed.  Dist LAD lesion is 80% stenosed.  Prox  Cx lesion is 40% stenosed.  Mid RCA lesion is 30% stenosed.   Prior CV studies:   The following studies were reviewed today:   Past Medical History:  Diagnosis Date  . Acute colitis 01/27/2017  . Acute pyelonephritis   . Anemia    iron deficiency anemia  . Aortic arch aneurysm ([place])   . BRCA negative 2014  . CAD (coronary artery disease)    a. 08/2003 Cath: LAD  30-40-med Rx; b. 11/2014 PCI: LAD 50m(3.25x23 Xience Alpine DES); c. 06/2015 PCI: D1 (2.25x12 Resolute Integrity DES); d. 06/2017 PCI: Patent mLAD stent, D2 95 (PTCA); e. 09/2017 PCI: D2 99ost (CBA); d. 12/2017 Cath: LM nl, LAD 365m80d (small), D1 40ost, D2 95ost, LCX 40p, RCA 40ost/p->Med rx for D2 given restenosis.  . Colitis 06/03/2015  . Colon polyp   . CVA (cerebral vascular accident) ([facility]   Left side weakness.   . Degenerative tear of glenoid labrum of right shoulder 03/15/2017  . Diabetes mellitus without complication ([facility]  . Family history of breast cancer    BRCA neg 2014  . Femur fracture, left ([facility]9/10/2018  . Gastric ulcer 04/27/2011  . History of echocardiogram    a. 03/2017 Echo: EF 60-65%, no rwma; b. 02/2018 Echo: EF 60-65%, no rwma. Nl RV fxn. No cardiac source of emboli (admitted w/ stroke).  . Hypertension   . Malignant melanoma of skin of scalp ([facility]  . MI, acute, non ST segment elevation ([facility]  . Neuromuscular disorder ([facility]  . S/P drug eluting coronary stent placement 06/04/2015  . Sepsis ([facility]2/14/2019  . [facility]   a. 02/2018 MRI: 70m65mate acute/early subacute L medial frontal lobe inarct; b. 02/2018 MRA No large vessel occlusion or aneurysm. Mod to sev L P2 stenosis. thready L vertebral artery, diffusely dzs'd; c. 02/2018 Carotid U/S: <50% bilat ICA dzs.   Past Surgical History:  Procedure Laterality Date  . APPENDECTOMY    . CARDIAC CATHETERIZATION N/A 11/09/2014   Procedure: Coronary Angiography;  Surgeon: TimMinna MerrittsD;  [facility];  Service: Cardiovascular;  Laterality: N/A;  . CARDIAC CATHETERIZATION N/A 11/12/2014   Procedure: Coronary Stent Intervention;  Surgeon: AleIsaias CowmanD;  [facility];  Service: Cardiovascular;  Laterality: N/A;  . CARDIAC CATHETERIZATION N/A 04/18/2015   Procedure: Left Heart Cath and Coronary Angiography;  Surgeon: TimMinna MerrittsD;  [facility];   Service: Cardiovascular;  Laterality: N/A;  . CARDIAC CATHETERIZATION Left 06/04/2015   Procedure: Left Heart Cath and Coronary Angiography;  Surgeon: MuhWellington HampshireD;  [facility];  Service: Cardiovascular;  Laterality: Left;  . CARDIAC CATHETERIZATION N/A 06/04/2015   Procedure: Coronary Stent Intervention;  Surgeon: MuhWellington HampshireD;  [facility];  Service: Cardiovascular;  Laterality: N/A;  . CESAREAN SECTION  2001  . CHOLECYSTECTOMY N/A 11/18/2016   Procedure: LAPAROSCOPIC CHOLECYSTECTOMY WITH INTRAOPERATIVE CHOLANGIOGRAM;  Surgeon: SanChristene LyeD;  [facility];  Service: General;  Laterality: N/A;  . COLONOSCOPY WITH PROPOFOL N/A 04/27/2016   Procedure: COLONOSCOPY WITH PROPOFOL;  Surgeon: DarLucilla LameD;  [facility];  Laterality: N/A;  . COLONOSCOPY WITH PROPOFOL N/A 01/12/2018   Procedure: COLONOSCOPY WITH PROPOFOL;  Surgeon: [place], TeoBenay PikeD;  [facility];  Service: Endoscopy;  Laterality: N/A;  . CORONARY ANGIOPLASTY    . CORONARY BALLOON ANGIOPLASTY N/A  06/29/2017   Procedure: CORONARY BALLOON ANGIOPLASTY;  Surgeon: Wellington Hampshire, MD;  Location: [place] CV LAB;  Service: Cardiovascular;  Laterality: N/A;  . CORONARY BALLOON ANGIOPLASTY N/A 09/20/2017   Procedure: CORONARY BALLOON ANGIOPLASTY;  Surgeon: Wellington Hampshire, MD;  Location: [place] CV LAB;  Service: Cardiovascular;  Laterality: N/A;  . DILATION AND CURETTAGE OF UTERUS    . ESOPHAGOGASTRODUODENOSCOPY (EGD) WITH PROPOFOL N/A 09/14/2014   Procedure: ESOPHAGOGASTRODUODENOSCOPY (EGD) WITH PROPOFOL;  Surgeon: Josefine Class, MD;  Location: [facility];  Service: Endoscopy;  Laterality: N/A;  . ESOPHAGOGASTRODUODENOSCOPY (EGD) WITH PROPOFOL N/A 04/27/2016   Procedure: ESOPHAGOGASTRODUODENOSCOPY (EGD) WITH PROPOFOL;  Surgeon: [name], MD;  Location: [place];  Service:  Endoscopy;  Laterality: N/A;  Diabetic - oral meds  . ESOPHAGOGASTRODUODENOSCOPY (EGD) WITH PROPOFOL N/A 01/12/2018   Procedure: ESOPHAGOGASTRODUODENOSCOPY (EGD) WITH PROPOFOL;  Surgeon: Toledo, Benay Pike, MD;  [facility];  Service: Endoscopy;  Laterality: N/A;  . ESOPHAGOGASTRODUODENOSCOPY (EGD) WITH PROPOFOL N/A 04/11/2019   Procedure: ESOPHAGOGASTRODUODENOSCOPY (EGD) WITH PROPOFOL;  Surgeon: [name], MD;  [facility];  Service: Endoscopy;  Laterality: N/A;  . GASTRIC BYPASS  09/2009   [place] (IM) NAIL INTERTROCHANTERIC Left 10/13/2018   Procedure: INTRAMEDULLARY (IM) NAIL INTERTROCHANTRIC;  Surgeon: Leandrew Koyanagi, MD;  Location: [place];  Service: Orthopedics;  Laterality: Left;  . Left Carotid to sublcavian artery bypass w/ subclavian artery ligation     a. Performed @ [facility].  . LEFT HEART CATH AND CORONARY ANGIOGRAPHY Left 06/29/2017   Procedure: LEFT HEART CATH AND CORONARY ANGIOGRAPHY;  Surgeon: Wellington Hampshire, MD;  Location: [place] CV LAB;  Service: Cardiovascular;  Laterality: Left;  . LEFT HEART CATH AND CORONARY ANGIOGRAPHY N/A 09/20/2017   Procedure: LEFT HEART CATH AND CORONARY ANGIOGRAPHY;  Surgeon: Wellington Hampshire, MD;  Location: [place] CV LAB;  Service: Cardiovascular;  Laterality: N/A;  . LEFT HEART CATH AND CORONARY ANGIOGRAPHY N/A 12/20/2017   Procedure: LEFT HEART CATH AND CORONARY ANGIOGRAPHY;  Surgeon: Wellington Hampshire, MD;  Location: [place] CV LAB;  Service: Cardiovascular;  Laterality: N/A;  . LEFT HEART CATH AND CORONARY ANGIOGRAPHY N/A 04/20/2019   Procedure: LEFT HEART CATH AND CORONARY ANGIOGRAPHY possible PCI;  Surgeon: Minna Merritts, MD;  Location: [place] CV LAB;  Service: Cardiovascular;  Laterality: N/A;  . MELANOMA EXCISION  2016   Dr. Evorn Gong  . [place]  2002  . RIGHT OOPHORECTOMY    . SHOULDER ARTHROSCOPY WITH OPEN ROTATOR CUFF REPAIR Right  01/07/2016   Procedure: SHOULDER ARTHROSCOPY WITH DEBRIDMENT, SUBACHROMIAL DECOMPRESSION;  Surgeon: Corky Mull, MD;  Location: [facility];  Service: Orthopedics;  Laterality: Right;  . SHOULDER ARTHROSCOPY WITH OPEN ROTATOR CUFF REPAIR Right 03/16/2017   Procedure: SHOULDER ARTHROSCOPY WITH OPEN ROTATOR CUFF REPAIR POSSIBLE BICEPS TENODESIS;  Surgeon: Corky Mull, MD;  Location: [facility];  Service: Orthopedics;  Laterality: Right;  . TRIGGER FINGER RELEASE Right     [place]      Allergies:   Lipitor [atorvastatin] and Tramadol   Social History   Tobacco Use  . Smoking status: Former Smoker    Types: Cigarettes    Quit date: 08/31/1994    Years since quitting: 25.1  . Smokeless tobacco: Never Used  . Tobacco comment: quit 28 years ago  Vaping Use  . Vaping Use: Never used  Substance Use Topics  . Alcohol use: No    Alcohol/week: 0.0  standard drinks  . Drug use: No     Current Outpatient Medications on File Prior to Visit  Medication Sig Dispense Refill  . acarbose (PRECOSE) 25 MG tablet Take 1 tablet by mouth 3 (three) times daily before meals.    . ALPRAZolam (XANAX) 1 MG tablet Take 1 tablet by mouth twice daily as needed (Patient taking differently: Take 1 mg by mouth 2 (two) times daily as needed for anxiety. ) 60 tablet 3  . aspirin 81 MG EC tablet Take 81 mg by mouth daily.     Marland Kitchen buPROPion (WELLBUTRIN XL) 300 MG 24 hr tablet Take 1 tablet by mouth once daily 90 tablet 1  . carvedilol (COREG) 12.5 MG tablet Take 1 tablet (12.5 mg total) by mouth 2 (two) times daily with a meal. 60 tablet 3  . clopidogrel (PLAVIX) 75 MG tablet Take 1 tablet (75 mg total) by mouth daily. 30 tablet 0  . EUTHYROX 25 MCG tablet TAKE 1 TABLET BY MOUTH ONCE DAILY BEFORE BREAKFAST (Patient taking differently: Take 25 mcg by mouth daily before breakfast. ) 30 tablet 0  . gabapentin (NEURONTIN) 300 MG capsule Take 1 capsule (300 mg total) by mouth 2 (two) times daily. 180 capsule 4  . isosorbide  mononitrate (IMDUR) 120 MG 24 hr tablet Take 1 tablet (120 mg total) by mouth daily. 30 tablet 3  . nitroGLYCERIN (NITROSTAT) 0.4 MG SL tablet Place 1 tablet (0.4 mg total) under the tongue every 5 (five) minutes as needed for chest pain. 25 tablet 2  . pantoprazole (PROTONIX) 40 MG tablet Take 1 tablet by mouth twice daily (Patient taking differently: Take 40 mg by mouth 2 (two) times daily. ) 180 tablet 1  . promethazine (PHENERGAN) 25 MG tablet Take 25 mg by mouth every 6 (six) hours as needed for nausea or vomiting.     . traZODone (DESYREL) 100 MG tablet Take 100-200 mg by mouth at bedtime as needed for sleep.     . UBRELVY 100 MG TABS Take 1 tablet by mouth 2 (two) times daily as needed.    . venlafaxine XR (EFFEXOR-XR) 150 MG 24 hr capsule Take 1 capsule (150 mg total) by mouth daily with breakfast. 30 capsule 5   No current facility-administered medications on file prior to visit.     Family Hx: The patient's family history includes Anxiety disorder in her father and mother; Bipolar disorder in her mother; Breast cancer in an other family member; Breast cancer (age of onset: 60) in her maternal aunt and maternal aunt; Colon cancer in her cousin and father; Depression in her father, mother, and sister; Diabetes in her father and sister; Heart disease in her mother; Heart disease (age of onset: 24) in her father; Hyperlipidemia in her mother and sister; Hypertension in her father, mother, sister, and sister; Kidney cancer in her cousin; Kidney disease in her father and sister; Ovarian cancer in her cousin; Skin cancer in her father; Stroke in her father; Thyroid nodules in her sister. There is no history of Bladder Cancer.  ROS:   Please see the history of present illness.    Review of Systems  Constitutional: Negative.   HENT: Negative.   Respiratory: Positive for shortness of breath.   Cardiovascular: Positive for chest pain.  Gastrointestinal: Negative.   Musculoskeletal: Negative.    Neurological: Negative.   Psychiatric/Behavioral: Negative.   All other systems reviewed and are negative.   Labs/Other Tests and Data Reviewed:    Recent Labs: 10/17/2019:  ALT 22; B Natriuretic Peptide 130.3 10/18/2019: TSH 1.544 10/20/2019: BUN 35; Creatinine, Ser 1.85; Hemoglobin 10.1; Magnesium 2.1; Platelets 136; Potassium 4.8; Sodium 135   Recent Lipid Panel Lab Results  Component Value Date/Time   CHOL 125 04/19/2019 04:08 AM   CHOL 159 05/28/2016 12:12 PM   CHOL 197 12/30/2013 03:55 AM   TRIG 75 04/19/2019 04:08 AM   TRIG 261 (H) 12/30/2013 03:55 AM   HDL 34 (L) 04/19/2019 04:08 AM   HDL 34 (L) 05/28/2016 12:12 PM   HDL 28 (L) 12/30/2013 03:55 AM   CHOLHDL 3.7 04/19/2019 04:08 AM   LDLCALC 76 04/19/2019 04:08 AM   LDLCALC 103 (H) 12/08/2016 12:21 PM   LDLCALC 117 (H) 12/30/2013 03:55 AM   LDLDIRECT 83.3 01/26/2011 09:13 AM    Wt Readings from Last 3 Encounters:  10/25/19 145 lb (65.8 kg)  10/13/19 151 lb (68.5 kg)  09/28/19 151 lb (68.5 kg)     Exam:    Vital Signs: Vital signs may also be detailed in the HPI BP (!) 161/87 (BP Location: Left Arm, Patient Position: Sitting, Cuff Size: Normal)   Pulse 64   Ht _0  (1.575 m)   Wt 145 lb (65.8 kg)   BMI 26.52 kg/m   Wt Readings from Last 3 Encounters:  10/25/19 145 lb (65.8 kg)  10/13/19 151 lb (68.5 kg)  09/28/19 151 lb (68.5 kg)   Temp Readings from Last 3 Encounters:  10/20/19 97.9 F (36.6 C)  09/27/19 98.1 F (36.7 C) (Oral)  09/12/19 98.1 F (36.7 C) (Oral)   BP Readings from Last 3 Encounters:  10/25/19 (!) 161/87  10/20/19 (!) 155/83  09/28/19 118/80   Pulse Readings from Last 3 Encounters:  10/25/19 64  10/20/19 80  09/28/19 81     Well nourished, well developed female in no acute distress. Constitutional:  oriented to person, place, and time. No distress.  Head: Normocephalic and atraumatic.  Eyes:  no discharge. No scleral icterus.  Neck: Normal range of motion. Neck supple.   Neurological:   Coordination normal. Full exam not performed Skin:  No rash Psychiatric:  normal mood and affect. behavior is normal. Thought content normal.    ASSESSMENT & PLAN:    Problem List Items Addressed This Visit      Cardiology Problems   CAD (coronary artery disease) - Primary   Relevant Medications   rosuvastatin (CRESTOR) 40 MG tablet   amLODipine (NORVASC) 2.5 MG tablet   Stroke (HCC)   Relevant Medications   rosuvastatin (CRESTOR) 40 MG tablet   amLODipine (NORVASC) 2.5 MG tablet   Peripheral vascular disease, unspecified (HCC)   Relevant Medications   rosuvastatin (CRESTOR) 40 MG tablet   amLODipine (NORVASC) 2.5 MG tablet   Hypertension associated with stage 3 chronic kidney disease due to type 2 diabetes mellitus (HCC)   Relevant Medications   rosuvastatin (CRESTOR) 40 MG tablet   amLODipine (NORVASC) 2.5 MG tablet     Coronary artery disease with stable angina Chronic chest pain, known disease Some atypical and typical features She is concerned about known disease in the diagonal branches, previously felt not amenable to intervention (at least would be a very challenging procedure) At this point unclear what benefit she would have, relatively sedentary, deconditioned Declining cardiac rehab We will continue Imdur 120 daily Stressed importance of getting blood pressure down, we will restart some of her amlodipine 2.5 daily with titration upwards as needed for better blood pressure control --Continue aspirin Plavix,  Crestor back up to 40 (was well controlled on Crestor 40, unclear why this was dropped down to 5 mg on prior hospitalization) --She does not want referral to interventional cardiology at this time, prefers to wait 1 month to see if her symptoms will improve on current medications  Hypoglycemia At times with poor diet, recent hospitalization for low sugars Stressed importance of eating 3 meals a day She feels it is related to her history of  gastric bypass  History of stroke Continue aspirin Plavix, stressed importance of aggressive blood pressure control  COVID-19 Education: The signs and symptoms of COVID-19 were discussed with the patient and how to seek care for testing (follow up with PCP or arrange E-visit).  The importance of social distancing was discussed today.  Patient Risk:   After full review of this patients clinical status, I feel that they are at least moderate risk at this time.  Time:   Today, I have spent 25 minutes with the patient with telehealth technology discussing the cardiac and medical problems/diagnoses detailed above   Additional 10 min spent reviewing the chart prior to patient visit today   Medication Adjustments/Labs and Tests Ordered: Current medicines are reviewed at length with the patient today.  Concerns regarding medicines are outlined above.   Tests Ordered: No tests ordered   Medication Changes: No changes made    Signed, Ida Rogue, MD  [place] Office [address] [place] [address], [place], [place] [address]

## 2019-10-26 DIAGNOSIS — D631 Anemia in chronic kidney disease: Secondary | ICD-10-CM | POA: Diagnosis not present

## 2019-10-26 DIAGNOSIS — E1122 Type 2 diabetes mellitus with diabetic chronic kidney disease: Secondary | ICD-10-CM | POA: Diagnosis not present

## 2019-10-26 DIAGNOSIS — R809 Proteinuria, unspecified: Secondary | ICD-10-CM | POA: Diagnosis not present

## 2019-10-26 DIAGNOSIS — N2581 Secondary hyperparathyroidism of renal origin: Secondary | ICD-10-CM | POA: Diagnosis not present

## 2019-10-26 DIAGNOSIS — I10 Essential (primary) hypertension: Secondary | ICD-10-CM | POA: Diagnosis not present

## 2019-10-26 DIAGNOSIS — N1832 Chronic kidney disease, stage 3b: Secondary | ICD-10-CM | POA: Diagnosis not present

## 2019-10-27 ENCOUNTER — Other Ambulatory Visit: Payer: Self-pay | Admitting: Family Medicine

## 2019-10-27 DIAGNOSIS — E1142 Type 2 diabetes mellitus with diabetic polyneuropathy: Secondary | ICD-10-CM | POA: Diagnosis not present

## 2019-10-27 DIAGNOSIS — I252 Old myocardial infarction: Secondary | ICD-10-CM | POA: Diagnosis not present

## 2019-10-27 DIAGNOSIS — F3181 Bipolar II disorder: Secondary | ICD-10-CM | POA: Diagnosis not present

## 2019-10-27 DIAGNOSIS — I25118 Atherosclerotic heart disease of native coronary artery with other forms of angina pectoris: Secondary | ICD-10-CM | POA: Diagnosis not present

## 2019-10-27 DIAGNOSIS — I2699 Other pulmonary embolism without acute cor pulmonale: Secondary | ICD-10-CM | POA: Diagnosis not present

## 2019-10-27 DIAGNOSIS — F331 Major depressive disorder, recurrent, moderate: Secondary | ICD-10-CM | POA: Diagnosis not present

## 2019-10-27 DIAGNOSIS — E039 Hypothyroidism, unspecified: Secondary | ICD-10-CM

## 2019-10-27 DIAGNOSIS — E11649 Type 2 diabetes mellitus with hypoglycemia without coma: Secondary | ICD-10-CM | POA: Diagnosis not present

## 2019-10-27 DIAGNOSIS — F410 Panic disorder [episodic paroxysmal anxiety] without agoraphobia: Secondary | ICD-10-CM | POA: Diagnosis not present

## 2019-10-27 DIAGNOSIS — R55 Syncope and collapse: Secondary | ICD-10-CM | POA: Diagnosis not present

## 2019-10-27 DIAGNOSIS — F411 Generalized anxiety disorder: Secondary | ICD-10-CM | POA: Diagnosis not present

## 2019-10-27 NOTE — Telephone Encounter (Signed)
Verbal order given to [place].

## 2019-10-27 NOTE — Telephone Encounter (Signed)
Ok for OT/PT/Skilled Nursing/Social Work/Speech Therapy: PT Frequency: 1 time a week for 1 week and 2 times a week for 2 weeks

## 2019-10-29 DIAGNOSIS — R410 Disorientation, unspecified: Secondary | ICD-10-CM | POA: Diagnosis not present

## 2019-11-01 DIAGNOSIS — E113411 Type 2 diabetes mellitus with severe nonproliferative diabetic retinopathy with macular edema, right eye: Secondary | ICD-10-CM | POA: Diagnosis not present

## 2019-11-03 DIAGNOSIS — R55 Syncope and collapse: Secondary | ICD-10-CM | POA: Diagnosis not present

## 2019-11-03 DIAGNOSIS — E11649 Type 2 diabetes mellitus with hypoglycemia without coma: Secondary | ICD-10-CM | POA: Diagnosis not present

## 2019-11-03 DIAGNOSIS — I2699 Other pulmonary embolism without acute cor pulmonale: Secondary | ICD-10-CM | POA: Diagnosis not present

## 2019-11-03 DIAGNOSIS — I25118 Atherosclerotic heart disease of native coronary artery with other forms of angina pectoris: Secondary | ICD-10-CM | POA: Diagnosis not present

## 2019-11-03 DIAGNOSIS — I252 Old myocardial infarction: Secondary | ICD-10-CM | POA: Diagnosis not present

## 2019-11-03 DIAGNOSIS — E1142 Type 2 diabetes mellitus with diabetic polyneuropathy: Secondary | ICD-10-CM | POA: Diagnosis not present

## 2019-11-05 ENCOUNTER — Inpatient Hospital Stay
Admission: EM | Admit: 2019-11-05 | Discharge: 2019-11-07 | DRG: 176 | Disposition: A | Discharge status: Home Health | Payer: Medicare Other | Attending: Family Medicine | Admitting: Family Medicine

## 2019-11-05 ENCOUNTER — Encounter: Payer: Self-pay | Admitting: Emergency Medicine

## 2019-11-05 ENCOUNTER — Observation Stay: Payer: Medicare Other

## 2019-11-05 ENCOUNTER — Other Ambulatory Visit: Payer: Self-pay

## 2019-11-05 ENCOUNTER — Emergency Department: Payer: Medicare Other

## 2019-11-05 DIAGNOSIS — I5042 Chronic combined systolic (congestive) and diastolic (congestive) heart failure: Secondary | ICD-10-CM | POA: Diagnosis present

## 2019-11-05 DIAGNOSIS — I25118 Atherosclerotic heart disease of native coronary artery with other forms of angina pectoris: Secondary | ICD-10-CM | POA: Diagnosis present

## 2019-11-05 DIAGNOSIS — Z8639 Personal history of other endocrine, nutritional and metabolic disease: Secondary | ICD-10-CM | POA: Diagnosis present

## 2019-11-05 DIAGNOSIS — Z8041 Family history of malignant neoplasm of ovary: Secondary | ICD-10-CM

## 2019-11-05 DIAGNOSIS — E1140 Type 2 diabetes mellitus with diabetic neuropathy, unspecified: Secondary | ICD-10-CM | POA: Diagnosis present

## 2019-11-05 DIAGNOSIS — R7989 Other specified abnormal findings of blood chemistry: Secondary | ICD-10-CM

## 2019-11-05 DIAGNOSIS — E039 Hypothyroidism, unspecified: Secondary | ICD-10-CM | POA: Diagnosis not present

## 2019-11-05 DIAGNOSIS — I10 Essential (primary) hypertension: Secondary | ICD-10-CM | POA: Diagnosis present

## 2019-11-05 DIAGNOSIS — E785 Hyperlipidemia, unspecified: Secondary | ICD-10-CM | POA: Diagnosis present

## 2019-11-05 DIAGNOSIS — Z79899 Other long term (current) drug therapy: Secondary | ICD-10-CM

## 2019-11-05 DIAGNOSIS — Z83438 Family history of other disorder of lipoprotein metabolism and other lipidemia: Secondary | ICD-10-CM

## 2019-11-05 DIAGNOSIS — D509 Iron deficiency anemia, unspecified: Secondary | ICD-10-CM | POA: Diagnosis present

## 2019-11-05 DIAGNOSIS — I6389 Other cerebral infarction: Secondary | ICD-10-CM | POA: Diagnosis not present

## 2019-11-05 DIAGNOSIS — Z8719 Personal history of other diseases of the digestive system: Secondary | ICD-10-CM

## 2019-11-05 DIAGNOSIS — Z8249 Family history of ischemic heart disease and other diseases of the circulatory system: Secondary | ICD-10-CM

## 2019-11-05 DIAGNOSIS — E11649 Type 2 diabetes mellitus with hypoglycemia without coma: Secondary | ICD-10-CM | POA: Diagnosis present

## 2019-11-05 DIAGNOSIS — N1831 Chronic kidney disease, stage 3a: Secondary | ICD-10-CM | POA: Diagnosis not present

## 2019-11-05 DIAGNOSIS — I2699 Other pulmonary embolism without acute cor pulmonale: Principal | ICD-10-CM

## 2019-11-05 DIAGNOSIS — Z823 Family history of stroke: Secondary | ICD-10-CM

## 2019-11-05 DIAGNOSIS — Z818 Family history of other mental and behavioral disorders: Secondary | ICD-10-CM

## 2019-11-05 DIAGNOSIS — Z808 Family history of malignant neoplasm of other organs or systems: Secondary | ICD-10-CM

## 2019-11-05 DIAGNOSIS — Z90721 Acquired absence of ovaries, unilateral: Secondary | ICD-10-CM

## 2019-11-05 DIAGNOSIS — F32A Depression, unspecified: Secondary | ICD-10-CM | POA: Diagnosis present

## 2019-11-05 DIAGNOSIS — F319 Bipolar disorder, unspecified: Secondary | ICD-10-CM | POA: Diagnosis present

## 2019-11-05 DIAGNOSIS — I7389 Other specified peripheral vascular diseases: Secondary | ICD-10-CM | POA: Diagnosis present

## 2019-11-05 DIAGNOSIS — Z7982 Long term (current) use of aspirin: Secondary | ICD-10-CM

## 2019-11-05 DIAGNOSIS — R079 Chest pain, unspecified: Secondary | ICD-10-CM | POA: Diagnosis not present

## 2019-11-05 DIAGNOSIS — I130 Hypertensive heart and chronic kidney disease with heart failure and stage 1 through stage 4 chronic kidney disease, or unspecified chronic kidney disease: Secondary | ICD-10-CM | POA: Diagnosis present

## 2019-11-05 DIAGNOSIS — F419 Anxiety disorder, unspecified: Secondary | ICD-10-CM | POA: Diagnosis present

## 2019-11-05 DIAGNOSIS — E1129 Type 2 diabetes mellitus with other diabetic kidney complication: Secondary | ICD-10-CM | POA: Diagnosis present

## 2019-11-05 DIAGNOSIS — I2510 Atherosclerotic heart disease of native coronary artery without angina pectoris: Secondary | ICD-10-CM | POA: Diagnosis present

## 2019-11-05 DIAGNOSIS — Z8051 Family history of malignant neoplasm of kidney: Secondary | ICD-10-CM

## 2019-11-05 DIAGNOSIS — Z955 Presence of coronary angioplasty implant and graft: Secondary | ICD-10-CM

## 2019-11-05 DIAGNOSIS — K259 Gastric ulcer, unspecified as acute or chronic, without hemorrhage or perforation: Secondary | ICD-10-CM | POA: Diagnosis present

## 2019-11-05 DIAGNOSIS — Z833 Family history of diabetes mellitus: Secondary | ICD-10-CM

## 2019-11-05 DIAGNOSIS — E1122 Type 2 diabetes mellitus with diabetic chronic kidney disease: Secondary | ICD-10-CM | POA: Diagnosis present

## 2019-11-05 DIAGNOSIS — I25119 Atherosclerotic heart disease of native coronary artery with unspecified angina pectoris: Secondary | ICD-10-CM | POA: Diagnosis present

## 2019-11-05 DIAGNOSIS — R9082 White matter disease, unspecified: Secondary | ICD-10-CM | POA: Diagnosis not present

## 2019-11-05 DIAGNOSIS — R0789 Other chest pain: Secondary | ICD-10-CM | POA: Diagnosis not present

## 2019-11-05 DIAGNOSIS — G43909 Migraine, unspecified, not intractable, without status migrainosus: Secondary | ICD-10-CM | POA: Diagnosis present

## 2019-11-05 DIAGNOSIS — Z7902 Long term (current) use of antithrombotics/antiplatelets: Secondary | ICD-10-CM

## 2019-11-05 DIAGNOSIS — Z85820 Personal history of malignant melanoma of skin: Secondary | ICD-10-CM

## 2019-11-05 DIAGNOSIS — Z20822 Contact with and (suspected) exposure to covid-19: Secondary | ICD-10-CM | POA: Diagnosis not present

## 2019-11-05 DIAGNOSIS — N179 Acute kidney failure, unspecified: Secondary | ICD-10-CM | POA: Diagnosis present

## 2019-11-05 DIAGNOSIS — I639 Cerebral infarction, unspecified: Secondary | ICD-10-CM | POA: Diagnosis present

## 2019-11-05 DIAGNOSIS — E860 Dehydration: Secondary | ICD-10-CM | POA: Diagnosis present

## 2019-11-05 DIAGNOSIS — I69354 Hemiplegia and hemiparesis following cerebral infarction affecting left non-dominant side: Secondary | ICD-10-CM | POA: Diagnosis not present

## 2019-11-05 DIAGNOSIS — I959 Hypotension, unspecified: Secondary | ICD-10-CM | POA: Diagnosis not present

## 2019-11-05 DIAGNOSIS — Z888 Allergy status to other drugs, medicaments and biological substances status: Secondary | ICD-10-CM

## 2019-11-05 DIAGNOSIS — Z841 Family history of disorders of kidney and ureter: Secondary | ICD-10-CM

## 2019-11-05 DIAGNOSIS — K219 Gastro-esophageal reflux disease without esophagitis: Secondary | ICD-10-CM | POA: Diagnosis present

## 2019-11-05 DIAGNOSIS — Z9884 Bariatric surgery status: Secondary | ICD-10-CM

## 2019-11-05 DIAGNOSIS — R55 Syncope and collapse: Secondary | ICD-10-CM | POA: Diagnosis present

## 2019-11-05 DIAGNOSIS — I252 Old myocardial infarction: Secondary | ICD-10-CM

## 2019-11-05 DIAGNOSIS — R531 Weakness: Secondary | ICD-10-CM | POA: Diagnosis not present

## 2019-11-05 DIAGNOSIS — Z803 Family history of malignant neoplasm of breast: Secondary | ICD-10-CM

## 2019-11-05 DIAGNOSIS — I619 Nontraumatic intracerebral hemorrhage, unspecified: Secondary | ICD-10-CM | POA: Diagnosis not present

## 2019-11-05 DIAGNOSIS — Z800 Family history of malignant neoplasm of digestive organs: Secondary | ICD-10-CM

## 2019-11-05 DIAGNOSIS — R61 Generalized hyperhidrosis: Secondary | ICD-10-CM | POA: Diagnosis not present

## 2019-11-05 DIAGNOSIS — G379 Demyelinating disease of central nervous system, unspecified: Secondary | ICD-10-CM

## 2019-11-05 DIAGNOSIS — Z8711 Personal history of peptic ulcer disease: Secondary | ICD-10-CM

## 2019-11-05 DIAGNOSIS — Z87891 Personal history of nicotine dependence: Secondary | ICD-10-CM

## 2019-11-05 LAB — CBC
HCT: 34.3 % — ABNORMAL LOW (ref 36.0–46.0)
Hemoglobin: 11.5 g/dL — ABNORMAL LOW (ref 12.0–15.0)
MCH: 28 pg (ref 26.0–34.0)
MCHC: 33.5 g/dL (ref 30.0–36.0)
MCV: 83.5 fL (ref 80.0–100.0)
Platelets: 227 10*3/uL (ref 150–400)
RBC: 4.11 MIL/uL (ref 3.87–5.11)
RDW: 13.9 % (ref 11.5–15.5)
WBC: 7.3 10*3/uL (ref 4.0–10.5)
nRBC: 0 % (ref 0.0–0.2)

## 2019-11-05 LAB — APTT: aPTT: 39 seconds — ABNORMAL HIGH (ref 24–36)

## 2019-11-05 LAB — BASIC METABOLIC PANEL
Anion gap: 8 (ref 5–15)
BUN: 27 mg/dL — ABNORMAL HIGH (ref 6–20)
CO2: 23 mmol/L (ref 22–32)
Calcium: 8.9 mg/dL (ref 8.9–10.3)
Chloride: 108 mmol/L (ref 98–111)
Creatinine, Ser: 2.08 mg/dL — ABNORMAL HIGH (ref 0.44–1.00)
GFR calc Af Amer: 31 mL/min — ABNORMAL LOW (ref >60)
GFR calc non Af Amer: 27 mL/min — ABNORMAL LOW (ref >60)
Glucose, Bld: 94 mg/dL (ref 70–99)
Potassium: 5.1 mmol/L (ref 3.5–5.1)
Sodium: 139 mmol/L (ref 135–145)

## 2019-11-05 LAB — PROTIME-INR
INR: 1.1 (ref 0.8–1.2)
Prothrombin Time: 13.3 seconds (ref 11.4–15.2)

## 2019-11-05 LAB — TROPONIN I (HIGH SENSITIVITY)
Troponin I (High Sensitivity): 7 ng/L (ref <18)
Troponin I (High Sensitivity): 7 ng/L (ref <18)
Troponin I (High Sensitivity): 7 ng/L (ref <18)
Troponin I (High Sensitivity): 7 ng/L (ref <18)

## 2019-11-05 LAB — GLUCOSE, CAPILLARY: Glucose-Capillary: 115 mg/dL — ABNORMAL HIGH (ref 70–99)

## 2019-11-05 LAB — RESPIRATORY PANEL BY RT PCR (FLU A&B, COVID)
Influenza A by PCR: NEGATIVE
Influenza B by PCR: NEGATIVE
SARS Coronavirus 2 by RT PCR: NEGATIVE

## 2019-11-05 LAB — BRAIN NATRIURETIC PEPTIDE: B Natriuretic Peptide: 24.9 pg/mL (ref 0.0–100.0)

## 2019-11-05 LAB — FIBRIN DERIVATIVES D-DIMER (ARMC ONLY): Fibrin derivatives D-dimer (ARMC): 1090.87 ng/mL (FEU) — ABNORMAL HIGH (ref 0.00–499.00)

## 2019-11-05 LAB — HEPARIN LEVEL (UNFRACTIONATED): Heparin Unfractionated: 0.38 IU/mL (ref 0.30–0.70)

## 2019-11-05 MED ORDER — UBROGEPANT 100 MG PO TABS: 1.0000 | ORAL_TABLET | Freq: Two times a day (BID) | ORAL | Status: DC | PRN

## 2019-11-05 MED ORDER — ALPRAZOLAM 0.25 MG PO TABS: 1.0000 mg | ORAL_TABLET | Freq: Two times a day (BID) | ORAL | Status: DC | PRN

## 2019-11-05 MED ORDER — ROSUVASTATIN CALCIUM 10 MG PO TABS
40.0000 mg | ORAL_TABLET | Freq: Every day | ORAL | Status: DC
  Administered 2019-11-06: 40 mg via ORAL
  Filled 2019-11-05: qty 2
  Filled 2019-11-05: qty 4

## 2019-11-05 MED ORDER — PROPRANOLOL HCL 1 MG/ML IV SOLN: 0.5000 mg | Freq: Once | INTRAVENOUS | Status: DC

## 2019-11-05 MED ORDER — ASPIRIN EC 81 MG PO TBEC
81.0000 mg | DELAYED_RELEASE_TABLET | Freq: Every day | ORAL | Status: DC
  Administered 2019-11-06: 81 mg via ORAL

## 2019-11-05 MED ORDER — LEVOTHYROXINE SODIUM 25 MCG PO TABS
25.0000 ug | ORAL_TABLET | Freq: Every day | ORAL | Status: DC
  Administered 2019-11-06 – 2019-11-07 (×2): 25 ug via ORAL
  Filled 2019-11-05 (×2): qty 1

## 2019-11-05 MED ORDER — INSULIN ASPART 100 UNIT/ML ~~LOC~~ SOLN: 0.0000 [IU] | Freq: Every day | SUBCUTANEOUS | Status: DC

## 2019-11-05 MED ORDER — NITROGLYCERIN 0.4 MG SL SUBL
0.4000 mg | SUBLINGUAL_TABLET | SUBLINGUAL | Status: DC | PRN
  Administered 2019-11-05 (×3): 0.4 mg via SUBLINGUAL
  Filled 2019-11-05: qty 1

## 2019-11-05 MED ORDER — ALPRAZOLAM 0.5 MG PO TABS
1.0000 mg | ORAL_TABLET | Freq: Two times a day (BID) | ORAL | Status: DC | PRN
  Administered 2019-11-05 – 2019-11-07 (×4): 1 mg via ORAL
  Filled 2019-11-05 (×4): qty 2

## 2019-11-05 MED ORDER — ACETAMINOPHEN 325 MG PO TABS: 650.0000 mg | ORAL_TABLET | Freq: Four times a day (QID) | ORAL | Status: DC | PRN

## 2019-11-05 MED ORDER — INSULIN ASPART 100 UNIT/ML ~~LOC~~ SOLN
0.0000 [IU] | Freq: Three times a day (TID) | SUBCUTANEOUS | Status: DC
  Administered 2019-11-06 – 2019-11-07 (×2): 2 [IU] via SUBCUTANEOUS
  Filled 2019-11-05 (×2): qty 1

## 2019-11-05 MED ORDER — CLOPIDOGREL BISULFATE 75 MG PO TABS
75.0000 mg | ORAL_TABLET | Freq: Every day | ORAL | Status: DC
  Administered 2019-11-06 – 2019-11-07 (×2): 75 mg via ORAL
  Filled 2019-11-05 (×2): qty 1

## 2019-11-05 MED ORDER — GABAPENTIN 300 MG PO CAPS
300.0000 mg | ORAL_CAPSULE | Freq: Two times a day (BID) | ORAL | Status: DC
  Administered 2019-11-05 – 2019-11-07 (×4): 300 mg via ORAL
  Filled 2019-11-05 (×4): qty 1

## 2019-11-05 MED ORDER — PANTOPRAZOLE SODIUM 40 MG PO TBEC
40.0000 mg | DELAYED_RELEASE_TABLET | Freq: Two times a day (BID) | ORAL | Status: DC
  Administered 2019-11-05 – 2019-11-07 (×4): 40 mg via ORAL
  Filled 2019-11-05 (×4): qty 1

## 2019-11-05 MED ORDER — HEPARIN (PORCINE) 25000 UT/250ML-% IV SOLN
1400.0000 [IU]/h | INTRAVENOUS | Status: DC
  Administered 2019-11-05: 1100 [IU]/h via INTRAVENOUS
  Administered 2019-11-06: 1200 [IU]/h via INTRAVENOUS
  Filled 2019-11-05 (×2): qty 250

## 2019-11-05 MED ORDER — BUPROPION HCL ER (XL) 150 MG PO TB24
300.0000 mg | ORAL_TABLET | Freq: Every day | ORAL | Status: DC
  Administered 2019-11-06 – 2019-11-07 (×2): 300 mg via ORAL
  Filled 2019-11-05 (×2): qty 2

## 2019-11-05 MED ORDER — ONDANSETRON HCL 4 MG PO TABS: 4.0000 mg | ORAL_TABLET | Freq: Four times a day (QID) | ORAL | Status: DC | PRN

## 2019-11-05 MED ORDER — TRAZODONE HCL 100 MG PO TABS
100.0000 mg | ORAL_TABLET | Freq: Every evening | ORAL | Status: DC | PRN
  Administered 2019-11-07: 100 mg via ORAL
  Filled 2019-11-05: qty 1

## 2019-11-05 MED ORDER — HEPARIN SODIUM (PORCINE) 5000 UNIT/ML IJ SOLN: 5000.0000 [IU] | Freq: Three times a day (TID) | INTRAMUSCULAR | Status: DC

## 2019-11-05 MED ORDER — MORPHINE SULFATE (PF) 2 MG/ML IV SOLN
1.0000 mg | INTRAVENOUS | Status: DC | PRN
  Administered 2019-11-06: 1 mg via INTRAVENOUS
  Filled 2019-11-05: qty 1

## 2019-11-05 MED ORDER — SODIUM CHLORIDE 0.9 % IV SOLN: INTRAVENOUS | Status: DC

## 2019-11-05 MED ORDER — HEPARIN BOLUS VIA INFUSION
4000.0000 [IU] | Freq: Once | INTRAVENOUS | Status: AC
  Administered 2019-11-05: 4000 [IU] via INTRAVENOUS
  Filled 2019-11-05: qty 4000

## 2019-11-05 MED ORDER — VENLAFAXINE HCL ER 75 MG PO CP24
150.0000 mg | ORAL_CAPSULE | Freq: Every day | ORAL | Status: DC
  Administered 2019-11-06 – 2019-11-07 (×2): 150 mg via ORAL
  Filled 2019-11-05: qty 1
  Filled 2019-11-05: qty 2

## 2019-11-05 MED ORDER — PROPRANOLOL HCL ER 60 MG PO CP24
60.0000 mg | ORAL_CAPSULE | Freq: Every day | ORAL | Status: DC
  Administered 2019-11-05: 60 mg via ORAL
  Filled 2019-11-05 (×2): qty 1

## 2019-11-05 NOTE — H&P (Signed)
History and Physical    [name] JME:[id number] DOB: [birth year] DOA: 11/05/2019  Referring MD/NP/PA:   PCP: [name], MD   Patient coming from:  The patient is coming from home.  At baseline, pt is independent for most of ADL.        Chief Complaint: chest pain and syncope  HPI: [name] is a 49 y.o. female with medical history significant of hypertension, hyperlipidemia, diabetes mellitus, stroke, GERD, hypothyroidism, depression, anxiety, CAD, s/p of DES placement, gastric ulcer disease, colitis, anemia, aortic arch aneurysm, dCHF, PSVT, CKD-3, s/p of gastric bypass, s/p of bypass to left carotid to subclavian artery, who presents with chest pain and syncope.  Patient states that her chest pain started when she was in church.  It is located in the substernal area, initially 10 out of 10 severity, currently 3 out of 10 severity, pressure-like, radiating to left jaw and arm.  Associated with diaphoresis.  Patient denies shortness breath, cough, fever or chills. Per report, patient had oxygen desaturation to upper 80s and tachycardia with HR up to 200s per EDP.  Patient has nausea, no vomiting, diarrhea or abdominal pain.  No symptoms of UTI.  Per report, pt had loss of consciousness for about 3 minutes. Patient returned to normal mental status spontaneously.  Patient has chronic minimal left-sided weakness from previous stroke, which has not changed.  No unilateral numbness or tingling to extremities, no facial droop or slurred speech.   ED Course: pt was found to have positive D-dimer 1090, negative Covid PCR, worsening renal function, troponin level 7, WBC 7.3, temperature normal, blood pressure 94/61, heart rate 60, RR 15, oxygen saturation 99% on room air.  Chest x-ray negative.  CT head negative for acute intracranial abnormalities.  Patient is placed on progressive benefit observation.  Cardiology, Dr. Harrell Gave is consulted.  Review of Systems:    General: no fevers, chills, no body weight gain, has fatigue HEENT: no blurry vision, hearing changes or sore throat Respiratory: no dyspnea, coughing, wheezing CV: has chest pain, no palpitations GI: has nausea, no vomiting, abdominal pain, diarrhea, constipation GU: no dysuria, burning on urination, increased urinary frequency, hematuria  Ext: no leg edema Neuro: No vision change or hearing loss. Has chronic minimal left-sided weakness.  Has syncope Skin: no rash, no skin tear. MSK: No muscle spasm, no deformity, no limitation of range of movement in spin Heme: No easy bruising.  Travel history: No recent long distant travel.  Allergy:  Allergies  Allergen Reactions  . Lipitor [Atorvastatin] Other (See Comments)    Leg pains  . Tramadol Other (See Comments)    Mouth feels like it's on fire    Past Medical History:  Diagnosis Date  . Acute colitis 01/27/2017  . Acute pyelonephritis   . Anemia    iron deficiency anemia  . Aortic arch aneurysm ([place])   . BRCA negative 2014  . CAD (coronary artery disease)    a. 08/2003 Cath: LAD 30-40-med Rx; b. 11/2014 PCI: LAD 65m(3.25x23 Xience Alpine DES); c. 06/2015 PCI: D1 (2.25x12 Resolute Integrity DES); d. 06/2017 PCI: Patent mLAD stent, D2 95 (PTCA); e. 09/2017 PCI: D2 99ost (CBA); d. 12/2017 Cath: LM nl, LAD 366m80d (small), D1 40ost, D2 95ost, LCX 40p, RCA 40ost/p->Med rx for D2 given restenosis.  . Colitis 06/03/2015  . Colon polyp   . CVA (cerebral vascular accident) [place]   Left side weakness.   . Degenerative tear of glenoid labrum  of right shoulder 03/15/2017  . Diabetes mellitus without complication ([place])   . Family history of breast cancer    BRCA neg 2014  . Femur fracture, left ([place]) 10/12/2018  . Gastric ulcer 04/27/2011  . History of echocardiogram    a. 03/2017 Echo: EF 60-65%, no rwma; b. 02/2018 Echo: EF 60-65%, no rwma. Nl RV fxn. No cardiac source of emboli (admitted w/ stroke).  . Hypertension   . Malignant melanoma  of skin of scalp ([place])   . MI, acute, non ST segment elevation ([place])   . Neuromuscular disorder ([employer])   . S/P drug eluting coronary stent placement 06/04/2015  . Sepsis ([place]) 03/18/2017  . Stroke [facility])    a. 02/2018 MRI: 33m late acute/early subacute L medial frontal lobe inarct; b. 02/2018 MRA No large vessel occlusion or aneurysm. Mod to sev L P2 stenosis. thready L vertebral artery, diffusely dzs'd; c. 02/2018 Carotid U/S: <50% bilat ICA dzs.    Past Surgical History:  Procedure Laterality Date  . APPENDECTOMY    . CARDIAC CATHETERIZATION N/A 11/09/2014   Procedure: Coronary Angiography;  Surgeon: TMinna Merritts MD;  [facility];  Service: Cardiovascular;  Laterality: N/A;  . CARDIAC CATHETERIZATION N/A 11/12/2014   Procedure: Coronary Stent Intervention;  Surgeon: AIsaias Cowman MD;  [place];  Service: Cardiovascular;  Laterality: N/A;  . CARDIAC CATHETERIZATION N/A 04/18/2015   Procedure: Left Heart Cath and Coronary Angiography;  Surgeon: TMinna Merritts MD;  [facility];  Service: Cardiovascular;  Laterality: N/A;  . CARDIAC CATHETERIZATION Left 06/04/2015   Procedure: Left Heart Cath and Coronary Angiography;  Surgeon: MWellington Hampshire MD;  [place];  Service: Cardiovascular;  Laterality: Left;  . CARDIAC CATHETERIZATION N/A 06/04/2015   Procedure: Coronary Stent Intervention;  Surgeon: MWellington Hampshire MD;  [place] [facility];  Service: Cardiovascular;  Laterality: N/A;  . CESAREAN SECTION  2001  . CHOLECYSTECTOMY N/A 11/18/2016   Procedure: LAPAROSCOPIC CHOLECYSTECTOMY WITH INTRAOPERATIVE CHOLANGIOGRAM;  Surgeon: SChristene Lye MD;  [facility];  Service: General;  Laterality: N/A;  . COLONOSCOPY WITH PROPOFOL N/A 04/27/2016   Procedure: COLONOSCOPY WITH PROPOFOL;  Surgeon: DLucilla Lame MD;  [place] [facility]  Service: Endoscopy;  Laterality: N/A;  .  COLONOSCOPY WITH PROPOFOL N/A 01/12/2018   Procedure: COLONOSCOPY WITH PROPOFOL;  Surgeon: Toledo, TBenay Pike MD;  Location: [facility];  Service: Endoscopy;  Laterality: N/A;  . CORONARY ANGIOPLASTY    . CORONARY BALLOON ANGIOPLASTY N/A 06/29/2017   Procedure: CORONARY BALLOON ANGIOPLASTY;  Surgeon: AWellington Hampshire MD;  [place];  Service: Cardiovascular;  Laterality: N/A;  . CORONARY BALLOON ANGIOPLASTY N/A 09/20/2017   Procedure: CORONARY BALLOON ANGIOPLASTY;  Surgeon: AWellington Hampshire MD;  [facility];  Service: Cardiovascular;  Laterality: N/A;  . DILATION AND CURETTAGE OF UTERUS    . ESOPHAGOGASTRODUODENOSCOPY (EGD) WITH PROPOFOL N/A 09/14/2014   Procedure: ESOPHAGOGASTRODUODENOSCOPY (EGD) WITH PROPOFOL;  Surgeon: MJosefine Class MD;  [facility];  Service: Endoscopy;  Laterality: N/A;  . ESOPHAGOGASTRODUODENOSCOPY (EGD) WITH PROPOFOL N/A 04/27/2016   Procedure: ESOPHAGOGASTRODUODENOSCOPY (EGD) WITH PROPOFOL;  Surgeon: DLucilla Lame MD;  [place]  Service: Endoscopy;  Laterality: N/A;  Diabetic - oral meds  . ESOPHAGOGASTRODUODENOSCOPY (EGD) WITH PROPOFOL N/A 01/12/2018   Procedure: ESOPHAGOGASTRODUODENOSCOPY (EGD) WITH PROPOFOL;  Surgeon: Toledo, TBenay Pike MD;  Location: [facility];  Service: Endoscopy;  Laterality:  N/A;  . ESOPHAGOGASTRODUODENOSCOPY (EGD) WITH PROPOFOL N/A 04/11/2019   Procedure: ESOPHAGOGASTRODUODENOSCOPY (EGD) WITH PROPOFOL;  Surgeon: [name], MD;  Location[facility];  Service: Endoscopy;  Laterality: N/A;  . GASTRIC BYPASS  09/2009   [place] (IM) NAIL INTERTROCHANTERIC Left 10/13/2018   Procedure: INTRAMEDULLARY (IM) NAIL INTERTROCHANTRIC;  Surgeon: Leandrew Koyanagi, MD;  Location: [place];  Service: Orthopedics;  Laterality: Left;  . Left Carotid to sublcavian artery bypass w/ subclavian artery ligation     a. Performed @ [place].  .  LEFT HEART CATH AND CORONARY ANGIOGRAPHY Left 06/29/2017   Procedure: LEFT HEART CATH AND CORONARY ANGIOGRAPHY;  Surgeon: Wellington Hampshire, MD;  Location[place] [facility];  Service: Cardiovascular;  Laterality: Left;  . LEFT HEART CATH AND CORONARY ANGIOGRAPHY N/A 09/20/2017   Procedure: LEFT HEART CATH AND CORONARY ANGIOGRAPHY;  Surgeon: Wellington Hampshire, MD;  Location: [place] [facility];  Service: Cardiovascular;  Laterality: N/A;  . LEFT HEART CATH AND CORONARY ANGIOGRAPHY N/A 12/20/2017   Procedure: LEFT HEART CATH AND CORONARY ANGIOGRAPHY;  Surgeon: Wellington Hampshire, MD;  Location: [place] [facility] LAB;  Service: Cardiovascular;  Laterality: N/A;  . LEFT HEART CATH AND CORONARY ANGIOGRAPHY N/A 04/20/2019   Procedure: LEFT HEART CATH AND CORONARY ANGIOGRAPHY possible PCI;  Surgeon: Minna Merritts, MD;  Location: [place] [facility] LAB;  Service: Cardiovascular;  Laterality: N/A;  . MELANOMA EXCISION  2016   Dr. Evorn Gong  . [place]  2002  . RIGHT OOPHORECTOMY    . SHOULDER ARTHROSCOPY WITH OPEN ROTATOR CUFF REPAIR Right 01/07/2016   Procedure: SHOULDER ARTHROSCOPY WITH DEBRIDMENT, SUBACHROMIAL DECOMPRESSION;  Surgeon: Corky Mull, MD;  Location: [facility];  Service: Orthopedics;  Laterality: Right;  . SHOULDER ARTHROSCOPY WITH OPEN ROTATOR CUFF REPAIR Right 03/16/2017   Procedure: SHOULDER ARTHROSCOPY WITH OPEN ROTATOR CUFF REPAIR POSSIBLE BICEPS TENODESIS;  Surgeon: Corky Mull, MD;  Location: [facility];  Service: Orthopedics;  Laterality: Right;  . TRIGGER FINGER RELEASE Right     [place]    Social History:  reports that she quit smoking about 25 years ago. Her smoking use included cigarettes. She has never used smokeless tobacco. She reports that she does not drink alcohol and does not use drugs.  Family History:  Family History  Problem Relation Age of Onset  . Hypertension Mother   . Anxiety disorder Mother   . Depression Mother   . Bipolar disorder Mother     . Heart disease Mother        No details  . Hyperlipidemia Mother   . Kidney disease Father   . Heart disease Father 55  . Hypertension Father   . Diabetes Father   . Stroke Father   . Colon cancer Father        dx in his 44's  . Anxiety disorder Father   . Depression Father   . Skin cancer Father   . Kidney disease Sister   . Thyroid nodules Sister   . Hypertension Sister   . Hypertension Sister   . Diabetes Sister   . Hyperlipidemia Sister   . Depression Sister   . Breast cancer Maternal Aunt 64  . Breast cancer Maternal Aunt 73  . Ovarian cancer Cousin   . Colon cancer Cousin   . Kidney cancer Cousin   . Breast cancer Other   . Bladder Cancer Neg Hx      Prior to Admission medications  Medication Sig Start Date End Date Taking? Authorizing Provider  acarbose (PRECOSE) 25 MG tablet Take 1 tablet by mouth 3 (three) times daily before meals. 09/15/19   [provider]  ALPRAZolam Duanne Moron) 1 MG tablet Take 1 tablet by mouth twice daily as needed Patient taking differently: Take 1 mg by mouth 2 (two) times daily as needed for anxiety.  08/14/19   [name], MD  amLODipine (NORVASC) 2.5 MG tablet Take 1 tablet (2.5 mg total) by mouth daily. 10/25/19   Minna Merritts, MD  aspirin 81 MG EC tablet Take 81 mg by mouth daily.     [provider]  buPROPion (WELLBUTRIN XL) 300 MG 24 hr tablet Take 1 tablet by mouth once daily 10/21/19   [name], MD  carvedilol (COREG) 12.5 MG tablet Take 1 tablet (12.5 mg total) by mouth 2 (two) times daily with a meal. 10/20/19   Dwyane Dee, MD  clopidogrel (PLAVIX) 75 MG tablet Take 1 tablet (75 mg total) by mouth daily. 10/10/19 11/09/19  Minna Merritts, MD  EUTHYROX 25 MCG tablet TAKE 1 TABLET BY MOUTH ONCE DAILY BEFORE BREAKFAST 10/27/19   [name], MD  gabapentin (NEURONTIN) 300 MG capsule Take 1 capsule (300 mg total) by mouth 2 (two) times daily. 10/12/18   [name], MD  isosorbide mononitrate  (IMDUR) 120 MG 24 hr tablet Take 1 tablet (120 mg total) by mouth daily. 10/21/19   Dwyane Dee, MD  nitroGLYCERIN (NITROSTAT) 0.4 MG SL tablet Place 1 tablet (0.4 mg total) under the tongue every 5 (five) minutes as needed for chest pain. 10/20/19   Dwyane Dee, MD  pantoprazole (PROTONIX) 40 MG tablet Take 1 tablet by mouth twice daily Patient taking differently: Take 40 mg by mouth 2 (two) times daily.  06/06/19   [name], MD  promethazine (PHENERGAN) 25 MG tablet Take 25 mg by mouth every 6 (six) hours as needed for nausea or vomiting.  07/25/19   [provider]  rosuvastatin (CRESTOR) 40 MG tablet Take 1 tablet (40 mg total) by mouth daily at 6 PM. 10/25/19   Gollan, Kathlene November, MD  traZODone (DESYREL) 100 MG tablet Take 100-200 mg by mouth at bedtime as needed for sleep.  03/06/19   [provider]  UBRELVY 100 MG TABS Take 1 tablet by mouth 2 (two) times daily as needed. 09/13/19   [provider]  venlafaxine XR (EFFEXOR-XR) 150 MG 24 hr capsule Take 1 capsule (150 mg total) by mouth daily with breakfast. 10/23/19   [name], MD    Physical Exam: Vitals:   11/05/19 1500 11/05/19 1530 11/05/19 1600 11/05/19 1630  BP: 108/67 104/68 111/75 95/74  Pulse: (!) 55 (!) 56 (!) 56 60  Resp: _0 Temp:      TempSrc:      SpO2: 100% 99% 100% 100%  Weight:      Height:       General: Not in acute distress HEENT:       Eyes: PERRL, EOMI, no scleral icterus.       ENT: No discharge from the ears and nose, no pharynx injection, no tonsillar enlargement.        Neck: No JVD, no bruit, no mass felt. Heme: No neck lymph node enlargement. Cardiac: S1/S2, RRR, No murmurs, No gallops or rubs. Respiratory: No rales, wheezing, rhonchi or rubs. GI: Soft, nondistended, nontender, no rebound pain, no organomegaly, BS present.  GU: No hematuria Ext: No pitting leg edema bilaterally. 2+DP/PT pulse bilaterally. Musculoskeletal: No joint deformities, No joint  redness or warmth, no limitation of ROM in spin. Skin: No rashes.  Neuro: Alert, oriented X3, cranial nerves II-XII grossly intact, moves all extremities normally.  Psych: Patient is not psychotic, no suicidal or hemocidal ideation.  Labs on Admission: I have personally reviewed following labs and imaging studies  CBC: Recent Labs  Lab 11/05/19 1247  WBC 7.3  HGB 11.5*  HCT 34.3*  MCV 83.5  PLT 248   Basic Metabolic Panel: Recent Labs  Lab 11/05/19 1247  NA 139  K 5.1  CL 108  CO2 23  GLUCOSE 94  BUN 27*  CREATININE 2.08*  CALCIUM 8.9   GFR: Estimated Creatinine Clearance: 28 mL/min (A) (by C-G formula based on SCr of 2.08 mg/dL (H)). Liver Function Tests: No results for input(s): AST, ALT, ALKPHOS, BILITOT, PROT, ALBUMIN in the last 168 hours. No results for input(s): LIPASE, AMYLASE in the last 168 hours. No results for input(s): AMMONIA in the last 168 hours. Coagulation Profile: Recent Labs  Lab 11/05/19 1630  INR 1.1   Cardiac Enzymes: No results for input(s): CKTOTAL, CKMB, CKMBINDEX, TROPONINI in the last 168 hours. BNP (last 3 results) No results for input(s): PROBNP in the last 8760 hours. HbA1C: No results for input(s): HGBA1C in the last 72 hours. CBG: No results for input(s): GLUCAP in the last 168 hours. Lipid Profile: No results for input(s): CHOL, HDL, LDLCALC, TRIG, CHOLHDL, LDLDIRECT in the last 72 hours. Thyroid Function Tests: No results for input(s): TSH, T4TOTAL, FREET4, T3FREE, THYROIDAB in the last 72 hours. Anemia Panel: No results for input(s): VITAMINB12, FOLATE, FERRITIN, TIBC, IRON, RETICCTPCT in the last 72 hours. Urine analysis:    Component Value Date/Time   COLORURINE YELLOW (A) 10/18/2019 0826   APPEARANCEUR HAZY (A) 10/18/2019 0826   APPEARANCEUR Cloudy (A) 07/17/2014 1455   LABSPEC 1.013 10/18/2019 0826   LABSPEC 1.012 12/29/2013 1210   PHURINE 5.0 10/18/2019 0826   GLUCOSEU NEGATIVE 10/18/2019 0826   GLUCOSEU >=500  12/29/2013 1210   HGBUR NEGATIVE 10/18/2019 0826   HGBUR negative 04/14/2010 1435   BILIRUBINUR NEGATIVE 10/18/2019 0826   BILIRUBINUR small 08/22/2019 1014   BILIRUBINUR Negative 07/17/2014 1455   BILIRUBINUR Negative 12/29/2013 1210   KETONESUR NEGATIVE 10/18/2019 0826   PROTEINUR NEGATIVE 10/18/2019 0826   UROBILINOGEN 0.2 08/22/2019 1014   UROBILINOGEN negative 04/14/2010 1435   NITRITE NEGATIVE 10/18/2019 0826   LEUKOCYTESUR LARGE (A) 10/18/2019 0826   LEUKOCYTESUR Negative 12/29/2013 1210   Sepsis Labs: _0 (procalcitonin:4,lacticidven:4) ) Recent Results (from the past 240 hour(s))  Respiratory Panel by RT PCR (Flu A&B, Covid) - Nasopharyngeal Swab     Status: None   Collection Time: 11/05/19  2:34 PM   Specimen: Nasopharyngeal Swab  Result Value Ref Range Status   SARS Coronavirus 2 by RT PCR NEGATIVE NEGATIVE Final    Comment: (NOTE) SARS-CoV-2 target nucleic acids are NOT DETECTED.  The SARS-CoV-2 RNA is generally detectable in upper respiratoy specimens during the acute phase of infection. The lowest concentration of SARS-CoV-2 viral copies this assay can detect is 131 copies/mL. A negative result does not preclude SARS-Cov-2 infection and should not be used as the sole basis for treatment or other patient management decisions. A negative result may occur with  improper specimen collection/handling, submission of specimen other than nasopharyngeal swab, presence of viral mutation(s) within the areas targeted by this assay, and inadequate number of viral  copies (<131 copies/mL). A negative result must be combined with clinical observations, patient history, and epidemiological information. The expected result is Negative.  Fact Sheet for Patients:  PinkCheek.be  Fact Sheet for Healthcare Providers:  GravelBags.it  This test is no t yet approved or cleared by the [place] FDA and  has been  authorized for detection and/or diagnosis of SARS-CoV-2 by FDA under an Emergency Use Authorization (EUA). This EUA will remain  in effect (meaning this test can be used) for the duration of the COVID-19 declaration under Section 564(b)(1) of the Act, 21 U.S.C. section 360bbb-3(b)(1), unless the authorization is terminated or revoked sooner.     Influenza A by PCR NEGATIVE NEGATIVE Final   Influenza B by PCR NEGATIVE NEGATIVE Final    Comment: (NOTE) The Xpert Xpress SARS-CoV-2/FLU/RSV assay is intended as an aid in  the diagnosis of influenza from Nasopharyngeal swab specimens and  should not be used as a sole basis for treatment. Nasal washings and  aspirates are unacceptable for Xpert Xpress SARS-CoV-2/FLU/RSV  testing.  Fact Sheet for Patients: PinkCheek.be  Fact Sheet for Healthcare Providers: GravelBags.it  This test is not yet approved or cleared by the [place] FDA and  has been authorized for detection and/or diagnosis of SARS-CoV-2 by  FDA under an Emergency Use Authorization (EUA). This EUA will remain  in effect (meaning this test can be used) for the duration of the  Covid-19 declaration under Section 564(b)(1) of the Act, 21  U.S.C. section 360bbb-3(b)(1), unless the authorization is  terminated or revoked. Performed at [facility], [address] [place], [place] [address]      Radiological Exams on Admission: CT HEAD WO CONTRAST  Result Date: 11/05/2019 CLINICAL DATA:  Chest pain.  Intermittent loss consciousness. EXAM: CT HEAD WITHOUT CONTRAST TECHNIQUE: Contiguous axial images were obtained from the base of the skull through the vertex without intravenous contrast. COMPARISON:  CT head 02/18/2019 FINDINGS: Brain: No evidence of acute infarction, hemorrhage, hydrocephalus, extra-axial collection or mass lesion/mass effect. Old remote lacunar infarcts in the basal ganglia as well as corona  radiata bilaterally. Confluent periventricular white matter hypodensity consistent with chronic small vessel ischemic change. Vascular: No hyperdense vessel or unexpected calcification. Skull: Normal. Negative for fracture or focal lesion. Sinuses/Orbits: No acute finding. Other: None. IMPRESSION: 1. No acute intracranial findings. 2. Chronic small vessel ischemic change and remote infarcts. Electronically Signed   By: Audie Pinto M.D.   On: 11/05/2019 16:31   DG Chest Portable 1 View  Result Date: 11/05/2019 CLINICAL DATA:  49 year old female with chest pain diaphoresis and lethargy. EXAM: PORTABLE CHEST 1 VIEW COMPARISON:  Portable chest 10/18/2019 and earlier. FINDINGS: Portable AP upright view at 1255 hours. Lung volumes and mediastinal contours are within normal limits. Right side aortic arch demonstrated by CT last year. Visualized tracheal air column is within normal limits. Allowing for portable technique the lungs are clear. Left thoracic inlet surgical clips associated with chronic great vessel bypass graph. No acute osseous abnormality identified. Negative visible bowel gas pattern. IMPRESSION: No acute cardiopulmonary abnormality. 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 Electronically Signed   By: Genevie Ann M.D.   On: 11/05/2019 13:14     EKG: Independently reviewed.  Sinus rhythm, QTC 428, nonspecific T wave change  Assessment/Plan Principal Problem:   Chest pain Active Problems:   Essential hypertension   Stroke (HCC)   Hyperlipidemia   GERD (gastroesophageal reflux disease)   Hypothyroidism  CAD (coronary artery disease)   Anxiety   Syncope   Acute renal failure superimposed on stage 3a chronic kidney disease (HCC)   Type II diabetes mellitus with renal manifestations (HCC)   Chest pain and hx of CAD: s/p of DES.  Per report, patient had oxygen desaturation and tachycardia.  Patient also had syncope.  PE is a potential differential diagnosis.  Her  D-dimer is +1090.  Due to worsening renal function, cannot do CT angiogram.  Restart IV heparin empirically and get VQ scan.  Cardiology, Dr. Harrell Gave is consulted.  - place to progressive unit for observation - start IV heparin empirically - Trend Trop - Repeat EKG in the am  - prn Nitroglycerin, Morphine, and aspirin, Crestor - Risk factor stratification: will check FLP and A1C  - check UDS - 2d echo - LE doppler to r/o DVT  Syncope: Etiology is not clear.  Need to rule out PE and stroke -Frequent neuro check -MRI of brain -2D echo -UDS -Check orthostatic vital sign -IV fluid: 75 cc/h of normal saline  Essential hypertension: Blood pressures are soft, 94/61 -Hold amlodipine, Coreg -Hold Imdur -Continue propranolol  History of  Stroke (HCC) -Continue aspirin, Plavix and Crestor  Hyperlipidemia -Crestor  GERD (gastroesophageal reflux disease) -Protonix  Hypothyroidism -Synthroid  Anxiety -Continue home as needed Xanax  Acute renal failure superimposed on stage 3a chronic kidney disease ([place]): Recent baseline creatinine 1.1-1.5.  Her creatinine is at 2.08, BUN 27 -IV fluid: 75 cc/h of normal saline -Follow-up with BMP -Avoid using liver toxic medications  Type II diabetes mellitus with renal manifestations [facility]): Recent A1c 4.8, well controlled.  Patient taking Acarbose at home -Sliding scale insulin          DVT ppx: on IV  Heparin   Code Status: Full code Family Communication: not done, no family member is at bed side.     Disposition Plan:  Anticipate discharge back to previous environment Consults called: Dr. Harrell Gave Admission status:   progressive unit for obs   Status is: Observation  The patient remains OBS appropriate and will d/c before 2 midnights.  Dispo: The patient is from: Home              Anticipated d/c is to: Home              Anticipated d/c date is: 1 day              Patient currently is not medically stable to  d/c.          Date of Service 11/05/2019    Ivor Costa [facility]   If 7PM-7AM, please contact night-coverage [email or web address] 11/05/2019, 5:14 PM

## 2019-11-05 NOTE — ED Notes (Signed)
Pt reports CP and requests nitro

## 2019-11-05 NOTE — Consult Note (Signed)
ANTICOAGULATION CONSULT NOTE -  Pharmacy Consult for Heparin Drip Indication: possible pulmonary embolus  Allergies  Allergen Reactions  . Lipitor [Atorvastatin] Other (See Comments)    Leg pains  . Tramadol Other (See Comments)    Mouth feels like it's on fire    Patient Measurements: Height: '5\' 2"'  (157.5 cm) Weight: 63.5 kg (140 lb) IBW/kg (Calculated) : 50.1 Heparin Dosing Weight: 62.9kg  Vital Signs: Temp: 97.7 F (36.5 C) (10/03 2119) Temp Source: Oral (10/03 2119) BP: 128/67 (10/03 2233) Pulse Rate: 68 (10/03 2233)  Labs: Recent Labs    11/05/19 1247 11/05/19 1457 11/05/19 1630 11/05/19 2204  HGB 11.5*  --   --   --   HCT 34.3*  --   --   --   PLT 227  --   --   --   APTT  --   --  39*  --   LABPROT  --   --  13.3  --   INR  --   --  1.1  --   HEPARINUNFRC  --   --   --  0.38  CREATININE 2.08*  --   --   --   TROPONINIHS '7 7 7  ' --     Estimated Creatinine Clearance: 28 mL/min (A) (by C-G formula based on SCr of 2.08 mg/dL (H)).  Medical History: Past Medical History:  Diagnosis Date  . Acute colitis 01/27/2017  . Acute pyelonephritis   . Anemia    iron deficiency anemia  . Aortic arch aneurysm ([place])   . BRCA negative 2014  . CAD (coronary artery disease)    a. 08/2003 Cath: LAD 30-40-med Rx; b. 11/2014 PCI: LAD 12m(3.25x23 Xience Alpine DES); c. 06/2015 PCI: D1 (2.25x12 Resolute Integrity DES); d. 06/2017 PCI: Patent mLAD stent, D2 95 (PTCA); e. 09/2017 PCI: D2 99ost (CBA); d. 12/2017 Cath: LM nl, LAD 338m80d (small), D1 40ost, D2 95ost, LCX 40p, RCA 40ost/p->Med rx for D2 given restenosis.  . Colitis 06/03/2015  . Colon polyp   . CVA (cerebral vascular accident) ([place]   Left side weakness.   . Degenerative tear of glenoid labrum of right shoulder 03/15/2017  . Diabetes mellitus without complication ([place]  . Family history of breast cancer    BRCA neg 2014  . Femur fracture, left (HCMead9/10/2018  . Gastric ulcer 04/27/2011  . History of  echocardiogram    a. 03/2017 Echo: EF 60-65%, no rwma; b. 02/2018 Echo: EF 60-65%, no rwma. Nl RV fxn. No cardiac source of emboli (admitted w/ stroke).  . Hypertension   . Malignant melanoma of skin of scalp (HCAspers  . MI, acute, non ST segment elevation (HCF[place]  . Neuromuscular disorder (HCS[place]  . S/P drug eluting coronary stent placement 06/04/2015  . Sepsis ([place]CD[place]2/14/2019  . [facility]   a. 02/2018 MRI: 47m52mate acute/early subacute L medial frontal lobe inarct; b. 02/2018 MRA No large vessel occlusion or aneurysm. Mod to sev L P2 stenosis. thready L vertebral artery, diffusely dzs'd; c. 02/2018 Carotid U/S: <50% bilat ICA dzs.    Medications:  No PTA anticoagulation of record  Assessment: [name] a [age]o. female with a past medical history of CAD with 2 stents in place, hypertension, hyperlipidemia, prior CVA, and type 2 diabetes who presents with chest pain and an elevated D-dimer.     Pharmacy has been consulted to initiate and monitor a heparin drip for possible  PE.  Goal of Therapy:  Heparin level 0.3-0.7 units/ml Monitor platelets by anticoagulation protocol: Yes   Plan:  Will obtain baseline APTT and INR labs.  Give 4000 units bolus x 1 Start heparin infusion at 1100 units/hr Check anti-Xa level in 6 hours and daily while on heparin Continue to monitor H&H and platelets   1003 2204 HL 0.38, therapeutic.  Continue current heparin rate and recheck HL and CBC in am  Ena Dawley, PharmD Clinical Pharmacist 11/05/2019 10:54 PM

## 2019-11-05 NOTE — ED Notes (Signed)
Patient given a snack

## 2019-11-05 NOTE — ED Triage Notes (Signed)
[place] via EMS from church. During church pt started to have CP and became diaphoretic, lethargic, and pale. Pt states he CP in centralized and radiates to her jaw. Pt describes pain as pressure. Pt states she has hx of stent placement a couple of years ago. EMS reports 92/50, HR 60s, 100% on RA, 188 CBG, and pt was given 324 of ASA. On arrival, pt is A&Ox4 and NAD

## 2019-11-05 NOTE — ED Notes (Signed)
Pt to MRI att; per phone convo with Benjamine Mola, NP, and [facility], "ok to pause heparin for 22 minutes for MRI"

## 2019-11-05 NOTE — Consult Note (Signed)
ANTICOAGULATION CONSULT NOTE - Initial Consult  Pharmacy Consult for Heparin Drip Indication: possible pulmonary embolus  Allergies  Allergen Reactions  . Lipitor [Atorvastatin] Other (See Comments)    Leg pains  . Tramadol Other (See Comments)    Mouth feels like it's on fire    Patient Measurements: Height: _0  (157.5 cm) Weight: 63.5 kg (140 lb) IBW/kg (Calculated) : 50.1 Heparin Dosing Weight: 62.9kg  Vital Signs: Temp: 97.9 F (36.6 C) (10/03 1247) Temp Source: Oral (10/03 1247) BP: 108/67 (10/03 1500) Pulse Rate: 55 (10/03 1500)  Labs: Recent Labs    11/05/19 1247 11/05/19 1457  HGB 11.5*  --   HCT 34.3*  --   PLT 227  --   CREATININE 2.08*  --   TROPONINIHS 7 7    Estimated Creatinine Clearance: 28 mL/min (A) (by C-G formula based on SCr of 2.08 mg/dL (H)).   Medical History: Past Medical History:  Diagnosis Date  . Acute colitis 01/27/2017  . Acute pyelonephritis   . Anemia    iron deficiency anemia  . Aortic arch aneurysm ([place])   . BRCA negative 2014  . CAD (coronary artery disease)    a. 08/2003 Cath: LAD 30-40-med Rx; b. 11/2014 PCI: LAD 66m(3.25x23 Xience Alpine DES); c. 06/2015 PCI: D1 (2.25x12 Resolute Integrity DES); d. 06/2017 PCI: Patent mLAD stent, D2 95 (PTCA); e. 09/2017 PCI: D2 99ost (CBA); d. 12/2017 Cath: LM nl, LAD 335m80d (small), D1 40ost, D2 95ost, LCX 40p, RCA 40ost/p->Med rx for D2 given restenosis.  . Colitis 06/03/2015  . Colon polyp   . CVA (cerebral vascular accident) ([place]   Left side weakness.   . Degenerative tear of glenoid labrum of right shoulder 03/15/2017  . Diabetes mellitus without complication ([facility]CGreen [place]  . Family history of breast cancer    BRCA neg 2014  . Femur fracture, left (HCBeckett Ridge9/10/2018  . Gastric ulcer 04/27/2011  . History of echocardiogram    a. 03/2017 Echo: EF 60-65%, no rwma; b. 02/2018 Echo: EF 60-65%, no rwma. Nl RV fxn. No cardiac source of emboli (admitted w/ stroke).  . Hypertension   . Malignant  melanoma of skin of scalp (HCOlathe  . MI, acute, non ST segment elevation (HCLund  . Neuromuscular disorder (HCWahak Hot[place]  . S/P drug eluting coronary stent placement 06/04/2015  . Sepsis (HCGlen [place]2/14/2019  . [facility]   a. 02/2018 MRI: 42m30mate acute/early subacute L medial frontal lobe inarct; b. 02/2018 MRA No large vessel occlusion or aneurysm. Mod to sev L P2 stenosis. thready L vertebral artery, diffusely dzs'd; c. 02/2018 Carotid U/S: <50% bilat ICA dzs.    Medications:  No PTA anticoagulation of record  Assessment: [name] a [age]o. female with a past medical history of CAD with 2 stents in place, hypertension, hyperlipidemia, prior CVA, and type 2 diabetes who presents with chest pain and an elevated D-dimer.     Pharmacy has been consulted to initiate and monitor a heparin drip for possible PE.  Goal of Therapy:  Heparin level 0.3-0.7 units/ml Monitor platelets by anticoagulation protocol: Yes   Plan:  Will obtain baseline APTT and INR labs.  Give 4000 units bolus x 1 Start heparin infusion at 1100 units/hr Check anti-Xa level in 6 hours and daily while on heparin Continue to monitor H&H and platelets  ChaLu DuffelharmD, BCPS Clinical Pharmacist 11/05/2019 3:58 PM

## 2019-11-05 NOTE — ED Notes (Signed)
Pt back from MRI att; reports CP improved, requesting to speak to cardiologist, son at bedside

## 2019-11-05 NOTE — ED Provider Notes (Signed)
[facility] Emergency Department Provider Note   ____________________________________________   First MD Initiated Contact with Patient 11/05/19 1246     (approximate)  I have reviewed the triage vital signs and the nursing notes.   HISTORY  Chief Complaint Chest Pain    HPI [name] is a 49 y.o. female with a past medical history of CAD with 2 stents in place, hypertension, hyperlipidemia, prior CVA, and type 2 diabetes who presents for chest pain.  [place] [place] patient [place] she had an episode of crushing substernal chest pain that radiates to her left jaw and arm and was associated with diaphoresis.  According to patient's grandson at bedside who is a Airline pilot, patient had an episode of diaphoresis, altered mental status, and intermittent loss of consciousness that lasted approximately 3 minutes.  Patient returned to normal mental status spontaneously but continued to complain of substernal chest pressure.  Patient states that the pain is still there and at 3/10 in severity at this time with associated left neck pain.  Patient does not endorse associated shortness of breath.  Witness to this event at bedside denies any focal neurologic deficits.      Past Medical History:  Diagnosis Date  . Acute colitis 01/27/2017  . Acute pyelonephritis   . Anemia    iron deficiency anemia  . Aortic arch aneurysm ([place])   . BRCA negative 2014  . CAD (coronary artery disease)    a. 08/2003 Cath: LAD 30-40-med Rx; b. 11/2014 PCI: LAD 41m(3.25x23 Xience Alpine DES); c. 06/2015 PCI: D1 (2.25x12 Resolute Integrity DES); d. 06/2017 PCI: Patent mLAD stent, D2 95 (PTCA); e. 09/2017 PCI: D2 99ost (CBA); d. 12/2017 Cath: LM nl, LAD 343m80d (small), D1 40ost, D2 95ost, LCX 40p, RCA 40ost/p->Med rx for D2 given restenosis.  . Colitis 06/03/2015  . Colon polyp   . CVA (cerebral vascular accident) (HCO[place]   Left side weakness.   . Degenerative  tear of glenoid labrum of right shoulder 03/15/2017  . Diabetes mellitus without complication ([facility]  . Family history of breast cancer    BRCA neg 2014  . Femur fracture, left (HCSedalia9/10/2018  . Gastric ulcer 04/27/2011  . History of echocardiogram    a. 03/2017 Echo: EF 60-65%, no rwma; b. 02/2018 Echo: EF 60-65%, no rwma. Nl RV fxn. No cardiac source of emboli (admitted w/ stroke).  . Hypertension   . Malignant melanoma of skin of scalp (HCBancroft  . MI, acute, non ST segment elevation (HCTrezevant  . Neuromuscular disorder (HCR[place]  . S/P drug eluting coronary stent placement 06/04/2015  . Sepsis (HCMount [facility]2/14/2019  . [facility] ([facility]   a. 02/2018 MRI: 4m36mate acute/early subacute L medial frontal lobe inarct; b. 02/2018 MRA No large vessel occlusion or aneurysm. Mod to sev L P2 stenosis. thready L vertebral artery, diffusely dzs'd; c. 02/2018 Carotid U/S: <50% bilat ICA dzs.    Patient Active Problem List   Diagnosis Date Noted  . Chest pain 11/05/2019  . Type II diabetes mellitus with renal manifestations (HCCLiberal0/04/2019  . Acute renal failure superimposed on stage 3a chronic kidney disease (HCCEnid7/31/2021  . Hypoglycemia   . Family history of breast cancer 05/23/2019  . Family history of colon cancer 05/23/2019  . Abnormal LFTs 04/18/2019  . Esophageal dysphagia   . Gastrointestinal tract imaging abnormality   . Trochanteric bursitis, left hip 03/17/2019  . Malignant neoplasm of  vulva, unspecified ([place]) 03/10/2019  . Peripheral vascular disease, unspecified ([place]) 03/10/2019  . Paroxysmal supraventricular tachycardia ([place]) 03/10/2019  . Contusion of left hip 03/06/2019  . Chest pain, moderate coronary artery risk 01/27/2019  . Melena   . Hypertensive urgency   . Recurrent chest pain 01/19/2019  . Depression, major, single episode, moderate ([place]) 12/09/2018  . Closed nondisplaced intertrochanteric fracture of left femur ([place]) 10/12/2018  . Hypotension 06/26/2018  . Autonomic neuropathy  03/24/2018  . Acute delirium 03/03/2018  . H/O gastric bypass 03/02/2018  . Hypertension associated with stage 3 chronic kidney disease due to type 2 diabetes mellitus ([place]) 02/20/2018  . SI (sacroiliac) joint dysfunction 12/02/2017  . Effort angina ([place]) 06/29/2017  . Unstable angina ([place]) 06/24/2017  . Syncope 04/09/2017  . Insomnia 03/18/2017  . Ischemic cardiomyopathy   . Arthritis   . Anxiety   . Tendinitis of upper biceps tendon of right shoulder 03/16/2017  . Hx of colonic polyps   . H/O medication noncompliance 12/14/2015  . CAD (coronary artery disease)   . Hypertensive heart disease   . Status post bariatric surgery 06/04/2015  . Carotid stenosis 04/30/2015  . Stable angina pectoris ([place]) 04/17/2015  . Iron deficiency anemia 03/22/2015  . Vitamin B12 deficiency 02/18/2015  . Misuse of medications for pain 02/18/2015  . Major depressive disorder, recurrent, severe with psychotic features ([place]) 02/15/2015  . Helicobacter pylori infection 11/23/2014  . Hemiparesis, left ([place]) 11/23/2014  . Malignant melanoma ([place]) 08/25/2014  . Chronic systolic CHF (congestive heart failure) ([place])   . Incomplete bladder emptying 07/12/2014  . Hypothyroidism 12/30/2013  . Aberrant subclavian artery 11/17/2013  . Multiple sclerosis ([place]) 11/02/2013  . History of CVA with residual deficit 06/20/2013  . Headache, migraine 05/29/2013  . Hyperlipidemia   . GERD (gastroesophageal reflux disease)   . Neuropathy ([place]) 01/02/2011  . Stroke ([place]) 06/21/2008  . Depression with anxiety 05/01/2008  . Essential hypertension 05/01/2008    Past Surgical History:  Procedure Laterality Date  . APPENDECTOMY    . CARDIAC CATHETERIZATION N/A 11/09/2014   Procedure: Coronary Angiography;  Surgeon: Minna Merritts, MD;  Location: [place] CV LAB;  Service: Cardiovascular;  Laterality: N/A;  . CARDIAC CATHETERIZATION N/A 11/12/2014   Procedure: Coronary Stent Intervention;  Surgeon: Isaias Cowman, MD;  Location: [place] [facility];  Service: Cardiovascular;  Laterality: N/A;  . CARDIAC CATHETERIZATION N/A 04/18/2015   Procedure: Left Heart Cath and Coronary Angiography;  Surgeon: Minna Merritts, MD;  Location: [place] CV [facility];  Service: Cardiovascular;  Laterality: N/A;  . CARDIAC CATHETERIZATION Left 06/04/2015   Procedure: Left Heart Cath and Coronary Angiography;  Surgeon: Wellington Hampshire, MD;  Location: [place] [facility];  Service: Cardiovascular;  Laterality: Left;  . CARDIAC CATHETERIZATION N/A 06/04/2015   Procedure: Coronary Stent Intervention;  Surgeon: Wellington Hampshire, MD;  Location: [place] [facility] LAB;  Service: Cardiovascular;  Laterality: N/A;  . CESAREAN SECTION  2001  . CHOLECYSTECTOMY N/A 11/18/2016   Procedure: LAPAROSCOPIC CHOLECYSTECTOMY WITH INTRAOPERATIVE CHOLANGIOGRAM;  Surgeon: Christene Lye, MD;  Location: [facility];  Service: General;  Laterality: N/A;  . COLONOSCOPY WITH PROPOFOL N/A 04/27/2016   Procedure: COLONOSCOPY WITH PROPOFOL;  Surgeon: [name], MD;  Location: [place];  Service: Endoscopy;  Laterality: N/A;  . COLONOSCOPY WITH PROPOFOL N/A 01/12/2018   Procedure: COLONOSCOPY WITH PROPOFOL;  Surgeon: Toledo, Benay Pike, MD;  Location: [facility] ENDOSCOPY;  Service: Endoscopy;  Laterality: N/A;  . CORONARY ANGIOPLASTY    .  CORONARY BALLOON ANGIOPLASTY N/A 06/29/2017   Procedure: CORONARY BALLOON ANGIOPLASTY;  Surgeon: Wellington Hampshire, MD;  Location: [place] [facility];  Service: Cardiovascular;  Laterality: N/A;  . CORONARY BALLOON ANGIOPLASTY N/A 09/20/2017   Procedure: CORONARY BALLOON ANGIOPLASTY;  Surgeon: Wellington Hampshire, MD;  Location: [place] [facility];  Service: Cardiovascular;  Laterality: N/A;  . DILATION AND CURETTAGE OF UTERUS    . ESOPHAGOGASTRODUODENOSCOPY (EGD) WITH PROPOFOL N/A 09/14/2014   Procedure: ESOPHAGOGASTRODUODENOSCOPY (EGD) WITH PROPOFOL;  Surgeon: Josefine Class, MD;  Location[facility]  ENDOSCOPY;  Service: Endoscopy;  Laterality: N/A;  . ESOPHAGOGASTRODUODENOSCOPY (EGD) WITH PROPOFOL N/A 04/27/2016   Procedure: ESOPHAGOGASTRODUODENOSCOPY (EGD) WITH PROPOFOL;  Surgeon: [name], MD;  Location: [place];  Service: Endoscopy;  Laterality: N/A;  Diabetic - oral meds  . ESOPHAGOGASTRODUODENOSCOPY (EGD) WITH PROPOFOL N/A 01/12/2018   Procedure: ESOPHAGOGASTRODUODENOSCOPY (EGD) WITH PROPOFOL;  Surgeon: Toledo, Benay Pike, MD;  Location: [facility]OS[facility];  Service: Endoscopy;  Laterality: N/A;  . ESOPHAGOGASTRODUODENOSCOPY (EGD) WITH PROPOFOL N/A 04/11/2019   Procedure: ESOPHAGOGASTRODUODENOSCOPY (EGD) WITH PROPOFOL;  Surgeon: [name], MD;  Location: [facility] ENDOSCOPY;  Service: Endoscopy;  Laterality: N/A;  . GASTRIC BYPASS  09/2009   [place] (IM) NAIL INTERTROCHANTERIC Left 10/13/2018   Procedure: INTRAMEDULLARY (IM) NAIL INTERTROCHANTRIC;  Surgeon: Leandrew Koyanagi, MD;  Location: [place];  Service: Orthopedics;  Laterality: Left;  . Left Carotid to sublcavian artery bypass w/ subclavian artery ligation     a. Performed @ [facility].  . LEFT HEART CATH AND CORONARY ANGIOGRAPHY Left 06/29/2017   Procedure: LEFT HEART CATH AND CORONARY ANGIOGRAPHY;  Surgeon: Wellington Hampshire, MD;  Location: [place] [facility];  Service: Cardiovascular;  Laterality: Left;  . LEFT HEART CATH AND CORONARY ANGIOGRAPHY N/A 09/20/2017   Procedure: LEFT HEART CATH AND CORONARY ANGIOGRAPHY;  Surgeon: Wellington Hampshire, MD;  Location: [place] [facility];  Service: Cardiovascular;  Laterality: N/A;  . LEFT HEART CATH AND CORONARY ANGIOGRAPHY N/A 12/20/2017   Procedure: LEFT HEART CATH AND CORONARY ANGIOGRAPHY;  Surgeon: Wellington Hampshire, MD;  Location: [place] CV LAB;  Service: Cardiovascular;  Laterality: N/A;  . LEFT HEART CATH AND CORONARY ANGIOGRAPHY N/A 04/20/2019   Procedure: LEFT HEART CATH AND CORONARY ANGIOGRAPHY possible PCI;  Surgeon: Minna Merritts, MD;  Location: [place] [facility];  Service: Cardiovascular;  Laterality: N/A;  . MELANOMA EXCISION  2016   Dr. Evorn Gong  . [place]  2002  . RIGHT OOPHORECTOMY    . SHOULDER ARTHROSCOPY WITH OPEN ROTATOR CUFF REPAIR Right 01/07/2016   Procedure: SHOULDER ARTHROSCOPY WITH DEBRIDMENT, SUBACHROMIAL DECOMPRESSION;  Surgeon: Corky Mull, MD;  Location: [facility];  Service: Orthopedics;  Laterality: Right;  . SHOULDER ARTHROSCOPY WITH OPEN ROTATOR CUFF REPAIR Right 03/16/2017   Procedure: SHOULDER ARTHROSCOPY WITH OPEN ROTATOR CUFF REPAIR POSSIBLE BICEPS TENODESIS;  Surgeon: Corky Mull, MD;  Location: [facility];  Service: Orthopedics;  Laterality: Right;  . TRIGGER FINGER RELEASE Right     [place]    Prior to Admission medications   Medication Sig Start Date End Date Taking? Authorizing Provider  acarbose (PRECOSE) 25 MG tablet Take 1 tablet by mouth 3 (three) times daily before meals. 09/15/19   [provider]  ALPRAZolam Duanne Moron) 1 MG tablet Take 1 tablet by mouth twice daily as needed Patient taking differently: Take 1 mg by mouth 2 (two) times daily as needed for anxiety.  08/14/19   Fisher,  Kirstie Peri, MD  amLODipine (NORVASC) 2.5 MG tablet Take 1 tablet (2.5 mg total) by mouth daily. 10/25/19   Minna Merritts, MD  aspirin 81 MG EC tablet Take 81 mg by mouth daily.     [provider]  buPROPion (WELLBUTRIN XL) 300 MG 24 hr tablet Take 1 tablet by mouth once daily 10/21/19   [name], MD  carvedilol (COREG) 12.5 MG tablet Take 1 tablet (12.5 mg total) by mouth 2 (two) times daily with a meal. 10/20/19   Dwyane Dee, MD  clopidogrel (PLAVIX) 75 MG tablet Take 1 tablet (75 mg total) by mouth daily. 10/10/19 11/09/19  Minna Merritts, MD  EUTHYROX 25 MCG tablet TAKE 1 TABLET BY MOUTH ONCE DAILY BEFORE BREAKFAST 10/27/19   [name], MD  gabapentin (NEURONTIN) 300 MG capsule Take 1 capsule (300 mg total) by mouth 2 (two) times daily. 10/12/18    [name], MD  isosorbide mononitrate (IMDUR) 120 MG 24 hr tablet Take 1 tablet (120 mg total) by mouth daily. 10/21/19   Dwyane Dee, MD  nitroGLYCERIN (NITROSTAT) 0.4 MG SL tablet Place 1 tablet (0.4 mg total) under the tongue every 5 (five) minutes as needed for chest pain. 10/20/19   Dwyane Dee, MD  pantoprazole (PROTONIX) 40 MG tablet Take 1 tablet by mouth twice daily Patient taking differently: Take 40 mg by mouth 2 (two) times daily.  06/06/19   [name], MD  promethazine (PHENERGAN) 25 MG tablet Take 25 mg by mouth every 6 (six) hours as needed for nausea or vomiting.  07/25/19   [provider]  rosuvastatin (CRESTOR) 40 MG tablet Take 1 tablet (40 mg total) by mouth daily at 6 PM. 10/25/19   Gollan, Kathlene November, MD  traZODone (DESYREL) 100 MG tablet Take 100-200 mg by mouth at bedtime as needed for sleep.  03/06/19   [provider]  UBRELVY 100 MG TABS Take 1 tablet by mouth 2 (two) times daily as needed. 09/13/19   [provider]  venlafaxine XR (EFFEXOR-XR) 150 MG 24 hr capsule Take 1 capsule (150 mg total) by mouth daily with breakfast. 10/23/19   [name], MD    Allergies Lipitor [atorvastatin] and Tramadol  Family History  Problem Relation Age of Onset  . Hypertension Mother   . Anxiety disorder Mother   . Depression Mother   . Bipolar disorder Mother   . Heart disease Mother        No details  . Hyperlipidemia Mother   . Kidney disease Father   . Heart disease Father 11  . Hypertension Father   . Diabetes Father   . Stroke Father   . Colon cancer Father        dx in his 76's  . Anxiety disorder Father   . Depression Father   . Skin cancer Father   . Kidney disease Sister   . Thyroid nodules Sister   . Hypertension Sister   . Hypertension Sister   . Diabetes Sister   . Hyperlipidemia Sister   . Depression Sister   . Breast cancer Maternal Aunt 86  . Breast cancer Maternal Aunt 44  . Ovarian cancer Cousin   .  Colon cancer Cousin   . Kidney cancer Cousin   . Breast cancer Other   . Bladder Cancer Neg Hx     Social History Social History   Tobacco Use  . Smoking status: Former Smoker    Types: Cigarettes  Quit date: 08/31/1994    Years since quitting: 25.1  . Smokeless tobacco: Never Used  . Tobacco comment: quit 28 years ago  Vaping Use  . Vaping Use: Never used  Substance Use Topics  . Alcohol use: No    Alcohol/week: 0.0 standard drinks  . Drug use: No    Review of Systems Constitutional: No fever/chills Eyes: No visual changes. ENT: No sore throat. Cardiovascular: Endorses chest pain. Respiratory: Denies shortness of breath. Gastrointestinal: No abdominal pain.  No nausea, no vomiting.  No diarrhea. Genitourinary: Negative for dysuria. Musculoskeletal: Negative for acute arthralgias Skin: Negative for rash. Neurological: Negative for headaches, weakness/numbness/paresthesias in any extremity Psychiatric: Negative for suicidal ideation/homicidal ideation   ____________________________________________   PHYSICAL EXAM:  VITAL SIGNS: ED Triage Vitals  Enc Vitals Group     BP 11/05/19 1241 94/61     Pulse Rate 11/05/19 1241 60     Resp 11/05/19 1241 15     Temp 11/05/19 1247 97.9 F (36.6 C)     Temp Source 11/05/19 1247 Oral     SpO2 11/05/19 1241 99 %     Weight 11/05/19 1242 140 lb (63.5 kg)     Height 11/05/19 1242 5' 2" (1.575 m)     Head Circumference --      Peak Flow --      Pain Score 11/05/19 1242 10     Pain Loc --      Pain Edu? --      Excl. in [place]? --    Constitutional: Alert and oriented. Well appearing and in no acute distress. Eyes: Conjunctivae are normal. PERRL. Head: Atraumatic. Nose: No congestion/rhinnorhea. Mouth/Throat: Mucous membranes are moist. Neck: No stridor Cardiovascular: Grossly normal heart sounds.  Good peripheral circulation. Respiratory: Normal respiratory effort.  No retractions. Gastrointestinal: Soft and nontender.  No distention. Musculoskeletal: No obvious deformities Neurologic:  Normal speech and language. No gross focal neurologic deficits are appreciated. Skin:  Skin is warm and dry. No rash noted. Psychiatric: Mood and affect are normal. Speech and behavior are normal.  ____________________________________________   LABS (all labs ordered are listed, but only abnormal results are displayed)  Labs Reviewed  BASIC METABOLIC PANEL - Abnormal; Notable for the following components:      Result Value   BUN 27 (*)    Creatinine, Ser 2.08 (*)    GFR calc non Af Amer 27 (*)    GFR calc Af Amer 31 (*)    All other components within normal limits  CBC - Abnormal; Notable for the following components:   Hemoglobin 11.5 (*)    HCT 34.3 (*)    All other components within normal limits  RESPIRATORY PANEL BY RT PCR (FLU A&B, COVID)  BRAIN NATRIURETIC PEPTIDE  FIBRIN DERIVATIVES D-DIMER ([facility] ONLY)  TROPONIN I (HIGH SENSITIVITY)  TROPONIN I (HIGH SENSITIVITY)  TROPONIN I (HIGH SENSITIVITY)   ____________________________________________  EKG  ED ECG REPORT I, Naaman Plummer, the attending physician, personally viewed and interpreted this ECG.  Date: 11/05/2019 EKG Time: 1236 Rate: 63.  Rhythm: normal sinus rhythm QRS Axis: normal Intervals: normal ST/T Wave abnormalities: normal Narrative Interpretation: no evidence of acute ischemia  ____________________________________________  RADIOLOGY  ED MD interpretation: Single view x-ray of the chest shows no evidence of pneumonia, pneumothorax, or widened mediastinum  Official radiology report(s): DG Chest Portable 1 View  Result Date: 11/05/2019 CLINICAL DATA:  49 year old female with chest pain diaphoresis and lethargy. EXAM: PORTABLE CHEST 1 VIEW COMPARISON:  Portable  chest 10/18/2019 and earlier. FINDINGS: Portable AP upright view at 1255 hours. Lung volumes and mediastinal contours are within normal limits. Right side aortic arch  demonstrated by CT last year. Visualized tracheal air column is within normal limits. Allowing for portable technique the lungs are clear. Left thoracic inlet surgical clips associated with chronic great vessel bypass graph. No acute osseous abnormality identified. Negative visible bowel gas pattern. IMPRESSION: No acute cardiopulmonary abnormality. 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 Electronically Signed   By: Genevie Ann M.D.   On: 11/05/2019 13:14    ____________________________________________   PROCEDURES  Procedure(s) performed (including Critical Care):  Procedures   ____________________________________________   INITIAL IMPRESSION / ASSESSMENT AND PLAN / ED COURSE  As part of my medical decision making, I reviewed the following data within the [place] notes reviewed and incorporated, Labs reviewed, EKG interpreted, Old chart reviewed, Radiograph reviewed and Notes from prior ED visits reviewed and incorporated        Workup: ECG, CXR, CBC, BMP, Troponin Findings: ECG: No overt evidence of STEMI. No evidence of Brugadas sign, delta wave, epsilon wave, significantly prolonged QTc, or malignant arrhythmia HS Troponin: Negative x1 Other Labs unremarkable for emergent problems. CXR: Without PTX, PNA, or widened mediastinum Last Stress Test: 4 years prior to arrival Last Heart Catheterization: 7 months prior to arrival HEART Score: 5  Given History, Exam, and Workup I have low suspicion for ACS, Pneumothorax, Pneumonia, Pulmonary Embolus, Tamponade, Aortic Dissection or other emergent problem as a cause for this presentation.  Disposition: Admit for continued cardiac monitoring and trending of troponins as well as further evaluation for potential inpatient stress testing vs cardiac catheterization and coronary angiography.}      ____________________________________________   FINAL CLINICAL IMPRESSION(S) / ED  DIAGNOSES  Final diagnoses:  Chest pain, unspecified type  Syncope, unspecified syncope type     ED Discharge Orders    None       Note:  This document was prepared using Dragon voice recognition software and may include unintentional dictation errors.   Naaman Plummer, MD 11/05/19 1535

## 2019-11-05 NOTE — ED Notes (Signed)
Pt finished eating meal tray and ambu to toilet att

## 2019-11-06 ENCOUNTER — Observation Stay: Payer: Medicare Other

## 2019-11-06 ENCOUNTER — Observation Stay
Admit: 2019-11-06 | Discharge: 2019-11-06 | Disposition: A | Discharge status: Home / Self Care | Payer: Medicare Other | Attending: Internal Medicine | Admitting: Internal Medicine

## 2019-11-06 DIAGNOSIS — F319 Bipolar disorder, unspecified: Secondary | ICD-10-CM | POA: Diagnosis present

## 2019-11-06 DIAGNOSIS — I208 Other forms of angina pectoris: Secondary | ICD-10-CM

## 2019-11-06 DIAGNOSIS — R079 Chest pain, unspecified: Secondary | ICD-10-CM | POA: Diagnosis not present

## 2019-11-06 DIAGNOSIS — N179 Acute kidney failure, unspecified: Secondary | ICD-10-CM | POA: Diagnosis present

## 2019-11-06 DIAGNOSIS — R7989 Other specified abnormal findings of blood chemistry: Secondary | ICD-10-CM | POA: Diagnosis not present

## 2019-11-06 DIAGNOSIS — Z87891 Personal history of nicotine dependence: Secondary | ICD-10-CM | POA: Diagnosis not present

## 2019-11-06 DIAGNOSIS — G43909 Migraine, unspecified, not intractable, without status migrainosus: Secondary | ICD-10-CM | POA: Diagnosis present

## 2019-11-06 DIAGNOSIS — F32A Depression, unspecified: Secondary | ICD-10-CM | POA: Diagnosis present

## 2019-11-06 DIAGNOSIS — I7389 Other specified peripheral vascular diseases: Secondary | ICD-10-CM | POA: Diagnosis present

## 2019-11-06 DIAGNOSIS — F419 Anxiety disorder, unspecified: Secondary | ICD-10-CM

## 2019-11-06 DIAGNOSIS — E11649 Type 2 diabetes mellitus with hypoglycemia without coma: Secondary | ICD-10-CM | POA: Diagnosis present

## 2019-11-06 DIAGNOSIS — K219 Gastro-esophageal reflux disease without esophagitis: Secondary | ICD-10-CM | POA: Diagnosis present

## 2019-11-06 DIAGNOSIS — I25119 Atherosclerotic heart disease of native coronary artery with unspecified angina pectoris: Secondary | ICD-10-CM | POA: Diagnosis not present

## 2019-11-06 DIAGNOSIS — G629 Polyneuropathy, unspecified: Secondary | ICD-10-CM | POA: Diagnosis not present

## 2019-11-06 DIAGNOSIS — I252 Old myocardial infarction: Secondary | ICD-10-CM | POA: Diagnosis not present

## 2019-11-06 DIAGNOSIS — I5042 Chronic combined systolic (congestive) and diastolic (congestive) heart failure: Secondary | ICD-10-CM | POA: Diagnosis present

## 2019-11-06 DIAGNOSIS — I2699 Other pulmonary embolism without acute cor pulmonale: Secondary | ICD-10-CM | POA: Diagnosis present

## 2019-11-06 DIAGNOSIS — E860 Dehydration: Secondary | ICD-10-CM | POA: Diagnosis present

## 2019-11-06 DIAGNOSIS — N1831 Chronic kidney disease, stage 3a: Secondary | ICD-10-CM | POA: Diagnosis present

## 2019-11-06 DIAGNOSIS — I130 Hypertensive heart and chronic kidney disease with heart failure and stage 1 through stage 4 chronic kidney disease, or unspecified chronic kidney disease: Secondary | ICD-10-CM | POA: Diagnosis present

## 2019-11-06 DIAGNOSIS — I69354 Hemiplegia and hemiparesis following cerebral infarction affecting left non-dominant side: Secondary | ICD-10-CM | POA: Diagnosis not present

## 2019-11-06 DIAGNOSIS — E039 Hypothyroidism, unspecified: Secondary | ICD-10-CM | POA: Diagnosis present

## 2019-11-06 DIAGNOSIS — E1122 Type 2 diabetes mellitus with diabetic chronic kidney disease: Secondary | ICD-10-CM | POA: Diagnosis present

## 2019-11-06 DIAGNOSIS — N183 Chronic kidney disease, stage 3 unspecified: Secondary | ICD-10-CM | POA: Diagnosis not present

## 2019-11-06 DIAGNOSIS — Z888 Allergy status to other drugs, medicaments and biological substances status: Secondary | ICD-10-CM | POA: Diagnosis not present

## 2019-11-06 DIAGNOSIS — I25118 Atherosclerotic heart disease of native coronary artery with other forms of angina pectoris: Secondary | ICD-10-CM | POA: Diagnosis present

## 2019-11-06 DIAGNOSIS — Z20822 Contact with and (suspected) exposure to covid-19: Secondary | ICD-10-CM | POA: Diagnosis present

## 2019-11-06 DIAGNOSIS — K259 Gastric ulcer, unspecified as acute or chronic, without hemorrhage or perforation: Secondary | ICD-10-CM | POA: Diagnosis present

## 2019-11-06 DIAGNOSIS — D509 Iron deficiency anemia, unspecified: Secondary | ICD-10-CM | POA: Diagnosis present

## 2019-11-06 DIAGNOSIS — E785 Hyperlipidemia, unspecified: Secondary | ICD-10-CM | POA: Diagnosis present

## 2019-11-06 DIAGNOSIS — I10 Essential (primary) hypertension: Secondary | ICD-10-CM

## 2019-11-06 DIAGNOSIS — E1140 Type 2 diabetes mellitus with diabetic neuropathy, unspecified: Secondary | ICD-10-CM | POA: Diagnosis present

## 2019-11-06 LAB — GLUCOSE, CAPILLARY
Glucose-Capillary: 60 mg/dL — ABNORMAL LOW (ref 70–99)
Glucose-Capillary: 94 mg/dL (ref 70–99)
Glucose-Capillary: 56 mg/dL — ABNORMAL LOW (ref 70–99)
Glucose-Capillary: 144 mg/dL — ABNORMAL HIGH (ref 70–99)
Glucose-Capillary: 198 mg/dL — ABNORMAL HIGH (ref 70–99)
Glucose-Capillary: 80 mg/dL (ref 70–99)

## 2019-11-06 LAB — LIPID PANEL
Cholesterol: 94 mg/dL (ref 0–200)
HDL: 29 mg/dL — ABNORMAL LOW (ref >40)
LDL Cholesterol: 48 mg/dL (ref 0–99)
Total CHOL/HDL Ratio: 3.2 RATIO
Triglycerides: 84 mg/dL (ref <150)
VLDL: 17 mg/dL (ref 0–40)

## 2019-11-06 LAB — BASIC METABOLIC PANEL
Anion gap: 8 (ref 5–15)
BUN: 25 mg/dL — ABNORMAL HIGH (ref 6–20)
CO2: 24 mmol/L (ref 22–32)
Calcium: 8.8 mg/dL — ABNORMAL LOW (ref 8.9–10.3)
Chloride: 108 mmol/L (ref 98–111)
Creatinine, Ser: 1.95 mg/dL — ABNORMAL HIGH (ref 0.44–1.00)
GFR calc Af Amer: 34 mL/min — ABNORMAL LOW (ref >60)
GFR calc non Af Amer: 29 mL/min — ABNORMAL LOW (ref >60)
Glucose, Bld: 77 mg/dL (ref 70–99)
Potassium: 4.5 mmol/L (ref 3.5–5.1)
Sodium: 140 mmol/L (ref 135–145)

## 2019-11-06 LAB — URINE DRUG SCREEN, QUALITATIVE (ARMC ONLY)
Amphetamines, Ur Screen: NOT DETECTED
Barbiturates, Ur Screen: NOT DETECTED
Benzodiazepine, Ur Scrn: POSITIVE — AB
Cannabinoid 50 Ng, Ur ~~LOC~~: NOT DETECTED
Cocaine Metabolite,Ur ~~LOC~~: NOT DETECTED
MDMA (Ecstasy)Ur Screen: NOT DETECTED
Methadone Scn, Ur: NOT DETECTED
Opiate, Ur Screen: NOT DETECTED
Phencyclidine (PCP) Ur S: NOT DETECTED
Tricyclic, Ur Screen: NOT DETECTED

## 2019-11-06 LAB — ECHOCARDIOGRAM COMPLETE
AR max vel: 1.39 cm2
AV Area VTI: 1.35 cm2
AV Area mean vel: 1.16 cm2
AV Mean grad: 5 mmHg
AV Peak grad: 8.1 mmHg
Ao pk vel: 1.42 m/s
Area-P 1/2: 2.71 cm2
Height: 62 in
S' Lateral: 3.39 cm
Weight: 2240 oz

## 2019-11-06 LAB — CBC
HCT: 31.8 % — ABNORMAL LOW (ref 36.0–46.0)
Hemoglobin: 10 g/dL — ABNORMAL LOW (ref 12.0–15.0)
MCH: 27 pg (ref 26.0–34.0)
MCHC: 31.4 g/dL (ref 30.0–36.0)
MCV: 85.7 fL (ref 80.0–100.0)
Platelets: 192 10*3/uL (ref 150–400)
RBC: 3.71 MIL/uL — ABNORMAL LOW (ref 3.87–5.11)
RDW: 13.8 % (ref 11.5–15.5)
WBC: 6.4 10*3/uL (ref 4.0–10.5)
nRBC: 0 % (ref 0.0–0.2)

## 2019-11-06 LAB — HEPARIN LEVEL (UNFRACTIONATED)
Heparin Unfractionated: 0.3 IU/mL (ref 0.30–0.70)
Heparin Unfractionated: 0.16 IU/mL — ABNORMAL LOW (ref 0.30–0.70)
Heparin Unfractionated: 0.27 IU/mL — ABNORMAL LOW (ref 0.30–0.70)

## 2019-11-06 LAB — HEMOGLOBIN A1C
Hgb A1c MFr Bld: 5.2 % (ref 4.8–5.6)
Mean Plasma Glucose: 102.54 mg/dL

## 2019-11-06 MED ORDER — DEXTROSE 50 % IV SOLN
INTRAVENOUS | Status: AC
  Administered 2019-11-06: 12.5 g via INTRAVENOUS
  Filled 2019-11-06: qty 50

## 2019-11-06 MED ORDER — HEPARIN BOLUS VIA INFUSION
800.0000 [IU] | Freq: Once | INTRAVENOUS | Status: AC
  Administered 2019-11-06: 800 [IU] via INTRAVENOUS
  Filled 2019-11-06: qty 800

## 2019-11-06 MED ORDER — CARVEDILOL 12.5 MG PO TABS
12.5000 mg | ORAL_TABLET | Freq: Two times a day (BID) | ORAL | Status: DC
  Administered 2019-11-06 – 2019-11-07 (×3): 12.5 mg via ORAL
  Filled 2019-11-06 (×3): qty 1

## 2019-11-06 MED ORDER — DEXTROSE 50 % IV SOLN: 12.5000 g | Freq: Once | INTRAVENOUS | Status: AC

## 2019-11-06 MED ORDER — DEXTROSE 50 % IV SOLN: 12.5000 g | INTRAVENOUS | Status: AC

## 2019-11-06 MED ORDER — ISOSORBIDE MONONITRATE ER 60 MG PO TB24
120.0000 mg | ORAL_TABLET | Freq: Every day | ORAL | Status: DC
  Administered 2019-11-06 – 2019-11-07 (×2): 120 mg via ORAL
  Filled 2019-11-06 (×2): qty 2

## 2019-11-06 MED ORDER — TECHNETIUM TO 99M ALBUMIN AGGREGATED
4.0000 | Freq: Once | INTRAVENOUS | Status: AC | PRN
  Administered 2019-11-06: 3.355 via INTRAVENOUS

## 2019-11-06 NOTE — Progress Notes (Signed)
PROGRESS NOTE    [name]  GHW:[id number] DOB: 01-27-1971 DOA: 11/05/2019 PCP: [name], MD   Brief Narrative: [name] is a 49 y.o. female with medical history significant of hypertension, hyperlipidemia, diabetes mellitus, stroke, GERD, hypothyroidism, depression, anxiety, CAD, s/p of DES placement, gastric ulcer disease, colitis, anemia, aortic arch aneurysm, dCHF, PSVT, CKD-3, s/p of gastric bypass, s/p of bypass to left carotid to subclavian artery. Patient presented secondary to chest pain and syncope concerning for cardiac etiology.   Assessment & Plan:   Principal Problem:   Chest pain Active Problems:   Essential hypertension   Stroke (HCC)   Hyperlipidemia   GERD (gastroesophageal reflux disease)   Hypothyroidism   CAD (coronary artery disease)   Anxiety   Syncope   Acute renal failure superimposed on stage 3a chronic kidney disease (HCC)   Type II diabetes mellitus with renal manifestations (HCC)   Chest pain Symptoms are typical in setting of stable angina. Not managed on home Imdur. Improvement after administration of nitroglycerin x3. She has recurrent chest pressure with ambulation while in the hospital. Cardiology consulted on 10/3 and recommendations pending to date. Patient made NPO pending cardiology evaluation. Transthoracic Echocardiogram without acute insult in relation to chest pain. Elevated D-dimer on admission. -Continue aspirin -Cardiology recommendations pending -V/Q scan pending  Syncope Patient states at the time of the episode, she had a significantly elevated pulse >200 bpm on pulse oximetry. She also reports being diaphoretic. Possible patient suffered significant arrhythmia. EKG on admission unremarkable for arrhythmia. EF is normal on Transthoracic Echocardiogram this admission. -Telemetry -Cardiology recommendations  Diabetes mellitus, type 2 Patient is on Acarbose 25 mg TID as an outpatient. Last  hemoglobin A1C of 5.2%. -Continue SSI -Diabetes coordinator with recommendations to discontinue Acarbose on patient discharge  CAD History of cardiac catheterization s/p PCI. Currently managed on aspirin, Crestor, Plavix -Continue Plavix, aspirin, Crestor  Essential hypertension Patient is on Imdur 120 mg daily, amlodipine 2.5 mg daily, propranolol ER 60 mg daily, Coreg 12.5 mg BID. On chart review, it appears patient's propranolol was discontinued. Antihypertensives held on admission secondary to soft blood pressures except propranolol. -Discontinue propranolol -Start home Coreg and Imdur  Hypothyroidism Patient is on Euthyrox 25 mcg daily as an outpatient. Started on Synthroid while inpatient. -Continue Synthroid  Chronic combined systolic and diastolic heart failure EF improved and is now normal. Grade 1 diastolic dysfunction. On Coreg as an outpatient. Euvolemic -Continue Coreg  AKI on CKD stage IIIa Baseline creatinine of 1.5. Creatinine of 2.08 on admission. Given IV fluids with mild improvement to 1.95 -Continue IV fluids while NPO  Anxiety Bipolar disorder -Continue Xanax prn, Effexor-XR, Wellbutrin XL  History of CVA On aspirin, Plavix and Crestor. As mentioned above  Neuropathy In setting of diabetes -Continue Gabapentin (renally dosed)  Aortic arch aneurysm Migraines S/p bypass left carotid artery to subclavian artery History of obesity s/p gastric bypass Body mass index is 25.61 kg/m.  DVT prophylaxis: Heparin drip Code Status:   Code Status: Full Code Family Communication: None at bedside Disposition Plan: Discharge pending cardiology recommendations/workup   Consultants:   Cardiology  Procedures:   Transthoracic Echocardiogram (11/06/2019) IMPRESSIONS    1. Left ventricular ejection fraction, by estimation, is 60 to 65%. The  left ventricle has normal function. The left ventricle has no regional  wall motion abnormalities. There is mild left  ventricular hypertrophy.  Left ventricular diastolic parameters  are consistent with Grade I diastolic dysfunction (impaired relaxation).  2. Right ventricular systolic function was not well visualized. The right  ventricular size is mildly enlarged.  3. Left atrial size was mildly dilated.  4. Right atrial size was mildly dilated.  5. The mitral valve is grossly normal. Trivial mitral valve  regurgitation.  6. The aortic valve was not well visualized. Aortic valve regurgitation  is not visualized.  Antimicrobials:  None    Subjective: Chest pain with ambulation. Some associated dyspnea. Has to catch her breath sometimes while lying down.  Objective: Vitals:   11/06/19 0300 11/06/19 0400 11/06/19 0500 11/06/19 0814  BP: 138/69 137/74 126/71 (!) 161/74  Pulse: (!) 56 (!) 55 (!) 54 (!) 55  Resp: 13 11 13 16   Temp:    97.6 F (36.4 C)  TempSrc:    Oral  SpO2: 97% 98% 97% 97%  Weight:      Height:       No intake or output data in the 24 hours ending 11/06/19 0850 Filed Weights   11/05/19 1242  Weight: 63.5 kg    Examination:  General exam: Appears calm and comfortable Respiratory system: Clear to auscultation. Respiratory effort normal. Cardiovascular system: S1 & S2 heard, RRR. 1/6 systolic heard at RUSB Gastrointestinal system: Abdomen is nondistended, soft and nontender. No organomegaly or masses felt. Normal bowel sounds heard. Central nervous system: Alert and oriented. No focal neurological deficits. Musculoskeletal: No edema. No calf tenderness Skin: No cyanosis. No rashes Psychiatry: Judgement and insight appear normal. Mood & affect appropriate.     Data Reviewed: I have personally reviewed following labs and imaging studies  CBC Lab Results  Component Value Date   WBC 6.4 11/06/2019   RBC 3.71 (L) 11/06/2019   HGB 10.0 (L) 11/06/2019   HCT 31.8 (L) 11/06/2019   MCV 85.7 11/06/2019   MCH 27.0 11/06/2019   PLT 192 11/06/2019   MCHC 31.4  11/06/2019   RDW 13.8 11/06/2019   LYMPHSABS 1.2 10/20/2019   MONOABS 0.8 10/20/2019   EOSABS 0.3 10/20/2019   BASOSABS 0.1 42/35/3614     Last metabolic panel Lab Results  Component Value Date   NA 140 11/06/2019   K 4.5 11/06/2019   CL 108 11/06/2019   CO2 24 11/06/2019   BUN 25 (H) 11/06/2019   CREATININE 1.95 (H) 11/06/2019   GLUCOSE 77 11/06/2019   GFRNONAA 29 (L) 11/06/2019   GFRAA 34 (L) 11/06/2019   CALCIUM 8.8 (L) 11/06/2019   PHOS 3.3 06/26/2018   PROT 6.4 (L) 10/17/2019   ALBUMIN 3.1 (L) 10/17/2019   LABGLOB 2.7 11/04/2018   AGRATIO 1.4 11/04/2018   BILITOT 0.7 10/17/2019   ALKPHOS 78 10/17/2019   AST 26 10/17/2019   ALT 22 10/17/2019   ANIONGAP 8 11/06/2019    CBG (last 3)  Recent Labs    11/05/19 1900  GLUCAP 115*     GFR: Estimated Creatinine Clearance: 29.9 mL/min (A) (by C-G formula based on SCr of 1.95 mg/dL (H)).  Coagulation Profile: Recent Labs  Lab 11/05/19 1630  INR 1.1    Recent Results (from the past 240 hour(s))  Respiratory Panel by RT PCR (Flu A&B, Covid) - Nasopharyngeal Swab     Status: None   Collection Time: 11/05/19  2:34 PM   Specimen: Nasopharyngeal Swab  Result Value Ref Range Status   SARS Coronavirus 2 by RT PCR NEGATIVE NEGATIVE Final    Comment: (NOTE) SARS-CoV-2 target nucleic acids are NOT DETECTED.  The SARS-CoV-2 RNA is generally detectable in upper respiratoy  specimens during the acute phase of infection. The lowest concentration of SARS-CoV-2 viral copies this assay can detect is 131 copies/mL. A negative result does not preclude SARS-Cov-2 infection and should not be used as the sole basis for treatment or other patient management decisions. A negative result may occur with  improper specimen collection/handling, submission of specimen other than nasopharyngeal swab, presence of viral mutation(s) within the areas targeted by this assay, and inadequate number of viral copies (<131 copies/mL). A negative  result must be combined with clinical observations, patient history, and epidemiological information. The expected result is Negative.  Fact Sheet for Patients:  PinkCheek.be  Fact Sheet for Healthcare Providers:  GravelBags.it  This test is no t yet approved or cleared by the [place] FDA and  has been authorized for detection and/or diagnosis of SARS-CoV-2 by FDA under an Emergency Use Authorization (EUA). This EUA will remain  in effect (meaning this test can be used) for the duration of the COVID-19 declaration under Section 564(b)(1) of the Act, 21 U.S.C. section 360bbb-3(b)(1), unless the authorization is terminated or revoked sooner.     Influenza A by PCR NEGATIVE NEGATIVE Final   Influenza B by PCR NEGATIVE NEGATIVE Final    Comment: (NOTE) The Xpert Xpress SARS-CoV-2/FLU/RSV assay is intended as an aid in  the diagnosis of influenza from Nasopharyngeal swab specimens and  should not be used as a sole basis for treatment. Nasal washings and  aspirates are unacceptable for Xpert Xpress SARS-CoV-2/FLU/RSV  testing.  Fact Sheet for Patients: PinkCheek.be  Fact Sheet for Healthcare Providers: GravelBags.it  This test is not yet approved or cleared by the [place] FDA and  has been authorized for detection and/or diagnosis of SARS-CoV-2 by  FDA under an Emergency Use Authorization (EUA). This EUA will remain  in effect (meaning this test can be used) for the duration of the  Covid-19 declaration under Section 564(b)(1) of the Act, 21  U.S.C. section 360bbb-3(b)(1), unless the authorization is  terminated or revoked. Performed at [facility], [address] [place], [place] [address]         Radiology Studies: CT HEAD WO CONTRAST  Result Date: 11/05/2019 CLINICAL DATA:  Chest pain.  Intermittent loss consciousness. EXAM: CT HEAD  WITHOUT CONTRAST TECHNIQUE: Contiguous axial images were obtained from the base of the skull through the vertex without intravenous contrast. COMPARISON:  CT head 02/18/2019 FINDINGS: Brain: No evidence of acute infarction, hemorrhage, hydrocephalus, extra-axial collection or mass lesion/mass effect. Old remote lacunar infarcts in the basal ganglia as well as corona radiata bilaterally. Confluent periventricular white matter hypodensity consistent with chronic small vessel ischemic change. Vascular: No hyperdense vessel or unexpected calcification. Skull: Normal. Negative for fracture or focal lesion. Sinuses/Orbits: No acute finding. Other: None. IMPRESSION: 1. No acute intracranial findings. 2. Chronic small vessel ischemic change and remote infarcts. Electronically Signed   By: Audie Pinto M.D.   On: 11/05/2019 16:31   MR BRAIN WO CONTRAST  Result Date: 11/05/2019 CLINICAL DATA:  Syncope EXAM: MRI HEAD WITHOUT CONTRAST TECHNIQUE: Multiplanar, multiecho pulse sequences of the brain and surrounding structures were obtained without intravenous contrast. COMPARISON:  02/19/2019 FINDINGS: Brain: No acute infarct, acute hemorrhage or extra-axial collection. Diffuse confluent hyperintense T2-weighted signal within the periventricular, deep and juxtacortical white matter. Multiple remote small vessel infarcts. Normal volume of CSF spaces. Fewer than 5 scattered microhemorrhages in a nonspecific pattern. Normal midline structures. Vascular: Normal flow voids. Skull and upper cervical spine: Normal marrow signal. Sinuses/Orbits:  Negative. Other: None. IMPRESSION: 1. No acute intracranial abnormality. 2. Diffuse confluent hyperintense T2-weighted signal within the periventricular, deep and juxtacortical white matter, consistent with chronic ischemic microangiopathy. Electronically Signed   By: Ulyses Jarred M.D.   On: 11/05/2019 21:15   US Venous Img Lower Bilateral (DVT)  Result Date: 11/05/2019 CLINICAL  DATA:  Elevated D-dimer. EXAM: BILATERAL LOWER EXTREMITY VENOUS DOPPLER ULTRASOUND TECHNIQUE: Gray-scale sonography with compression, as well as color and duplex ultrasound, were performed to evaluate the deep venous system(s) from the level of the common femoral vein through the popliteal and proximal calf veins. COMPARISON:  None. FINDINGS: VENOUS Normal compressibility of the common femoral, superficial femoral, and popliteal veins, as well as the visualized calf veins. Visualized portions of profunda femoral vein and great saphenous vein unremarkable. No filling defects to suggest DVT on grayscale or color Doppler imaging. Doppler waveforms show normal direction of venous flow, normal respiratory plasticity and response to augmentation. Limited views of the contralateral common femoral vein are unremarkable. OTHER None. Limitations: none IMPRESSION: Negative. Electronically Signed   By: Virgina Norfolk M.D.   On: 11/05/2019 18:15   DG Chest Portable 1 View  Result Date: 11/05/2019 CLINICAL DATA:  49 year old female with chest pain diaphoresis and lethargy. EXAM: PORTABLE CHEST 1 VIEW COMPARISON:  Portable chest 10/18/2019 and earlier. FINDINGS: Portable AP upright view at 1255 hours. Lung volumes and mediastinal contours are within normal limits. Right side aortic arch demonstrated by CT last year. Visualized tracheal air column is within normal limits. Allowing for portable technique the lungs are clear. Left thoracic inlet surgical clips associated with chronic great vessel bypass graph. No acute osseous abnormality identified. Negative visible bowel gas pattern. IMPRESSION: No acute cardiopulmonary abnormality. 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 Electronically Signed   By: Genevie Ann M.D.   On: 11/05/2019 13:14        Scheduled Meds:  aspirin EC  81 mg Oral Daily   buPROPion  300 mg Oral Daily   clopidogrel  75 mg Oral Daily   gabapentin  300 mg Oral BID    insulin aspart  0-5 Units Subcutaneous QHS   insulin aspart  0-9 Units Subcutaneous TID WC   levothyroxine  25 mcg Oral QAC breakfast   pantoprazole  40 mg Oral BID   propranolol ER  60 mg Oral QHS   rosuvastatin  40 mg Oral q1800   venlafaxine XR  150 mg Oral Q breakfast   Continuous Infusions:  sodium chloride 75 mL/hr at 11/06/19 0818   heparin 1,100 Units/hr (11/05/19 1652)     LOS: 0 days     Cordelia Poche, MD [facility] 11/06/2019, 8:50 AM  If 7PM-7AM, please contact night-coverage [email or web address]

## 2019-11-06 NOTE — Plan of Care (Signed)

## 2019-11-06 NOTE — Progress Notes (Signed)
Inpatient Diabetes Program Recommendations  AACE/ADA: New Consensus Statement on Inpatient Glycemic Control (2015)  Target Ranges:  Prepandial:   less than 140 mg/dL      Peak postprandial:   less than 180 mg/dL (1-2 hours)      Critically ill patients:  140 - 180 mg/dL   Lab Results  Component Value Date   GLUCAP 94 11/06/2019   HGBA1C 5.2 11/06/2019    Review of Glycemic Control Results for [facility], THISSEN (MRN [id number]) as of 11/06/2019 11:56  Ref. Range 11/05/2019 19:00 11/06/2019 08:50 11/06/2019 08:52 11/06/2019 09:54  Glucose-Capillary Latest Ref Range: 70 - 99 mg/dL 115 (H) 24 (LL) 60 (L) 94   Diabetes history: DM2 Outpatient Diabetes medications: Precose 25 mg tid Current orders for Inpatient glycemic control: Novolog sensitive correction tid + hs 0-5 units  Inpatient Diabetes Program Recommendations:   DM coordinator recommended decrease in Precose to 12.5 mg tid  If dosing available but has continued on 25 mg tid -Consider on discharge to discontinue Precose and followup with PCP.  Thank you, Nani Gasser. Marquest Gunkel, RN, MSN, CDE  Diabetes Coordinator Inpatient Glycemic Control Team Team Pager [phone] (8am-5pm) 11/06/2019 11:59 AM

## 2019-11-06 NOTE — Consult Note (Addendum)
Cardiology Consultation:   Patient ID: [name]; [id number]; 1970-02-28   Admit date: 11/05/2019 Date of Consult: 11/06/2019  Primary Care Provider: [name], MD Primary Cardiologist: [name] Primary Electrophysiologist:  None   Patient Profile:   [name] is a 49 y.o. female with a hx of CAD s/p prior PCI to the LAD and D1 with stable coronary anatomy by most recent cath in 04/2019,HFrEF secondary to ICM with prior EF of 45-50% in 2016 subsequent normalization of LVSF by echo in 10/2015,CKD stage III, prior CVAs, left carotid to subclavian artery bypass with subclavian artery ligation, aortic arch aneurysm, anemia, obesity status post gastric bypass,bipolar disorder, DMwith polyneuropathy and hypoglycemia,migraine disorder,HTN, HLD, and frequent falls/syncope/near syncope who is being seen today for the evaluation of chronic chest pain at the request of Dr. Blaine Hamper.  History of Present Illness:   [name] was diagnosed with CAD in 08/2003 with LHC at that time showing moderate LAD disease that was medically managed. She was seen in 11/2014, with angina and underwent LHC with PCI/DES placement to the mid LAD. She has not been able to tolerate escalation of medical management in the setting of relative hypotension or adverse effects of antianginal therapy. In 06/2015, she had repeat LHC that showed a patent LAD stent with development of significant D1 disease that was successfully treated with PCI/DES. With recurrent angina in 06/2017, LHC showed severe stenosis in the D2 s/p PTCA. Recurrent angina in 08/2017 led to repeat LHC that showed restenosis of the D2 that was treated with cutting balloon angioplasty. Repeat LHC in 12/2017 and was found 95% ostial stenosis/restenosis of the D2 with significant apical LAD disease. Given the recurrent restenosis with the D2, medical therapy was felt to be warranted. She was admitted to the hospital in 02/2018 with CVA  with MRI showing a late acute/early subacute left medial frontal lobe infarct. Echo at that time showed showed normal LVSF without cardiac source of emboli.  She was admitted to the hospital in 04/2019 chest pain in the setting of hypertensive urgency.  Cardiac enzymes were negative.  She underwent LHC on 04/20/2019 which showed no significant change in her coronary anatomy dating back to cardiac cath from 2019 with severe native vessel CAD, patent stents in the LAD and diagonal, stable severe ostial/proximal D2 disease that was without change when compared to prior cath and not amenable to stenting.  Her distal LAD was small and tapered to severe disease which also was not amenable to stenting.  Continued medical management was recommended.  She was seen in the [facility] ED on 7/30 with hypoglycemia along with dysphagia and intermittent chest pain.  At that time, high-sensitivity troponin was negative x3.  EKG was nonacute.  She ultimately ended up leaving AMA.  She returned to the ED the next day with continued hypoglycemic episodes with work-up being largely unrevealing.  Cardiology was asked to evaluate the patient for chronic chest pain.  High-sensitivity troponin was negative x2 and EKG was nonacute.  She was medically managed with addition of carvedilol and titrated dose of Imdur.  She was readmitted in 10/2019 with recurrent hypoglycemic episodes and again reported chronic angina high-sensitivity troponin was negative.  EKG showed no ischemic changes.  Carvedilol and Imdur were further titrated.  She was also noted to have some atypical characteristics of chest pain with tenderness of the sternum to palpation.  She was seen virtually by her primary cardiologist on 9/22 with continued  episodes of chest pain.  She was continued on Imdur 120 mg daily and amlodipine 2.5 mg daily was started.  She declined evaluation from interventional cardiology at that time.  Since then, she had continued to note intermittent  chronic angina. While at church on 10/3, she began to feel lightheaded and checked her sugar. She reported this was 210 at that time. Following this, she suffered a syncopal episode. She does not recall the circumstances surrounding this episode. She indicated she had been having chest pain for 3 days prior to the above and was taking SL NTG without much help in symptoms. She reports an Therapist, sports was at church and went out to get her medical bag. She reports pulse ox at that time showed an oxygen saturation in the 80s on room air and a heart rate of 250 bpm. She reports she was told she was unconscious for 3 minutes. No loss of bowel or bladder function.   Upon the patient's arrival to [facility] they were found to have stable vitials. EKG showed NSR, 63 bpm, early repolarization abnormalities, CXR showed no acute cardiopulmonary abnormality.  CT head showed no acute intracranial findings with chronic small vessel changes and remote infarcts.  Lower extremity ultrasound was negative for DVT bilaterally.  MRI of the brain showed no acute intracranial abnormality with changes consistent with chronic ischemic microangiopathy.  Labs showed high-sensitivity troponin of 7 with a delta of 7, this was repeated and unchanged at 7. HS-Tn was checked a forth time and found to be unchanged at 7.  BNP 24.  D-dimer elevated at 1090.87.  Covid negative.  WBC 7.3, Hgb 11.5 trending to 10, PLT 227, potassium 5.1.  To 4.5, BUN 27 trending to 25, serum creatinine 2.08 trending to 1.95, UDS positive for benzodiazepines, A1c 5.2, LDL 48.  Initial glucose on BMP in the ED 94 however, glucose this morning at 8:50 documented at 24 with repeat 2 minutes later noted to be 60 subsequently improving to 94 x 10 a.m.  Repeat glucose at 12:22 PM of 56.  She was started on a heparin drip and has received SL NTG x3.  H&P indicates patient had a heart rate into the 200s bpm per EDP though no objective evidence of this is available for review.  She is scheduled  for a VQ scan.  Currently, she continues to note chronic angina.  Past Medical History:  Diagnosis Date  . Acute colitis 01/27/2017  . Acute pyelonephritis   . Anemia    iron deficiency anemia  . Aortic arch aneurysm ([place])   . BRCA negative 2014  . CAD (coronary artery disease)    a. 08/2003 Cath: LAD 30-40-med Rx; b. 11/2014 PCI: LAD 9m(3.25x23 Xience Alpine DES); c. 06/2015 PCI: D1 (2.25x12 Resolute Integrity DES); d. 06/2017 PCI: Patent mLAD stent, D2 95 (PTCA); e. 09/2017 PCI: D2 99ost (CBA); d. 12/2017 Cath: LM nl, LAD 325m80d (small), D1 40ost, D2 95ost, LCX 40p, RCA 40ost/p->Med rx for D2 given restenosis.  . Colitis 06/03/2015  . Colon polyp   . CVA (cerebral vascular accident) ([place]   Left side weakness.   . Degenerative tear of glenoid labrum of right shoulder 03/15/2017  . Diabetes mellitus without complication ([place]  . Family history of breast cancer    BRCA neg 2014  . Femur fracture, left ([place]/10/2018  . Gastric ulcer 04/27/2011  . History of echocardiogram    a. 03/2017 Echo: EF 60-65%, no rwma; b. 02/2018 Echo: EF  60-65%, no rwma. Nl RV fxn. No cardiac source of emboli (admitted w/ stroke).  . Hypertension   . Malignant melanoma of skin of scalp ([place])   . MI, acute, non ST segment elevation ([place])   . Neuromuscular disorder ([place])   . S/P drug eluting coronary stent placement 06/04/2015  . Sepsis ([place]) 03/18/2017  . Stroke [facility])    a. 02/2018 MRI: 51m late acute/early subacute L medial frontal lobe inarct; b. 02/2018 MRA No large vessel occlusion or aneurysm. Mod to sev L P2 stenosis. thready L vertebral artery, diffusely dzs'd; c. 02/2018 Carotid U/S: <50% bilat ICA dzs.    Past Surgical History:  Procedure Laterality Date  . APPENDECTOMY    . CARDIAC CATHETERIZATION N/A 11/09/2014   Procedure: Coronary Angiography;  Surgeon: TMinna Merritts MD;  [place] [place];  Service: Cardiovascular;  Laterality: N/A;  . CARDIAC CATHETERIZATION N/A 11/12/2014    Procedure: Coronary Stent Intervention;  Surgeon: AIsaias Cowman MD;  [place];  Service: Cardiovascular;  Laterality: N/A;  . CARDIAC CATHETERIZATION N/A 04/18/2015   Procedure: Left Heart Cath and Coronary Angiography;  Surgeon: TMinna Merritts MD;  [place] [facility];  Service: Cardiovascular;  Laterality: N/A;  . CARDIAC CATHETERIZATION Left 06/04/2015   Procedure: Left Heart Cath and Coronary Angiography;  Surgeon: MWellington Hampshire MD;  [place] [facility];  Service: Cardiovascular;  Laterality: Left;  . CARDIAC CATHETERIZATION N/A 06/04/2015   Procedure: Coronary Stent Intervention;  Surgeon: MWellington Hampshire MD;  Location: [facility];  Service: Cardiovascular;  Laterality: N/A;  . CESAREAN SECTION  2001  . CHOLECYSTECTOMY N/A 11/18/2016   Procedure: LAPAROSCOPIC CHOLECYSTECTOMY WITH INTRAOPERATIVE CHOLANGIOGRAM;  Surgeon: SChristene Lye MD;  Location: [facility];  Service: General;  Laterality: N/A;  . COLONOSCOPY WITH PROPOFOL N/A 04/27/2016   Procedure: COLONOSCOPY WITH PROPOFOL;  Surgeon: DLucilla Lame MD;  [place]  Service: Endoscopy;  Laterality: N/A;  . COLONOSCOPY WITH PROPOFOL N/A 01/12/2018   Procedure: COLONOSCOPY WITH PROPOFOL;  Surgeon: Toledo, TBenay Pike MD;  Location: [facility];  Service: Endoscopy;  Laterality: N/A;  . CORONARY ANGIOPLASTY    . CORONARY BALLOON ANGIOPLASTY N/A 06/29/2017   Procedure: CORONARY BALLOON ANGIOPLASTY;  Surgeon: AWellington Hampshire MD;  [place] [facility];  Service: Cardiovascular;  Laterality: N/A;  . CORONARY BALLOON ANGIOPLASTY N/A 09/20/2017   Procedure: CORONARY BALLOON ANGIOPLASTY;  Surgeon: AWellington Hampshire MD;  [place] [facility];  Service: Cardiovascular;  Laterality: N/A;  . DILATION AND CURETTAGE OF UTERUS    . ESOPHAGOGASTRODUODENOSCOPY (EGD) WITH PROPOFOL N/A 09/14/2014   Procedure: ESOPHAGOGASTRODUODENOSCOPY (EGD) WITH  PROPOFOL;  Surgeon: MJosefine Class MD;  [facility]  Service: Endoscopy;  Laterality: N/A;  . ESOPHAGOGASTRODUODENOSCOPY (EGD) WITH PROPOFOL N/A 04/27/2016   Procedure: ESOPHAGOGASTRODUODENOSCOPY (EGD) WITH PROPOFOL;  Surgeon: DLucilla Lame MD;  [place]  Service: Endoscopy;  Laterality: N/A;  Diabetic - oral meds  . ESOPHAGOGASTRODUODENOSCOPY (EGD) WITH PROPOFOL N/A 01/12/2018   Procedure: ESOPHAGOGASTRODUODENOSCOPY (EGD) WITH PROPOFOL;  Surgeon: Toledo, TBenay Pike MD;  Location: [facility];  Service: Endoscopy;  Laterality: N/A;  . ESOPHAGOGASTRODUODENOSCOPY (EGD) WITH PROPOFOL N/A 04/11/2019   Procedure: ESOPHAGOGASTRODUODENOSCOPY (EGD) WITH PROPOFOL;  Surgeon: WLucilla Lame MD;  Location: [facility];  Service: Endoscopy;  Laterality: N/A;  . GASTRIC BYPASS  09/2009   [place](IM) NAIL INTERTROCHANTERIC Left 10/13/2018   Procedure: INTRAMEDULLARY (  IM) NAIL INTERTROCHANTRIC;  Surgeon: Leandrew Koyanagi, MD;  Location: [place];  Service: Orthopedics;  Laterality: Left;  . Left Carotid to sublcavian artery bypass w/ subclavian artery ligation     a. Performed @ [place].  . LEFT HEART CATH AND CORONARY ANGIOGRAPHY Left 06/29/2017   Procedure: LEFT HEART CATH AND CORONARY ANGIOGRAPHY;  Surgeon: Wellington Hampshire, MD;  Location: [place] [facility];  Service: Cardiovascular;  Laterality: Left;  . LEFT HEART CATH AND CORONARY ANGIOGRAPHY N/A 09/20/2017   Procedure: LEFT HEART CATH AND CORONARY ANGIOGRAPHY;  Surgeon: Wellington Hampshire, MD;  Location: [place] [facility] LAB;  Service: Cardiovascular;  Laterality: N/A;  . LEFT HEART CATH AND CORONARY ANGIOGRAPHY N/A 12/20/2017   Procedure: LEFT HEART CATH AND CORONARY ANGIOGRAPHY;  Surgeon: Wellington Hampshire, MD;  Location: [place] [facility] LAB;  Service: Cardiovascular;  Laterality: N/A;  . LEFT HEART CATH AND CORONARY ANGIOGRAPHY N/A 04/20/2019   Procedure: LEFT HEART CATH AND  CORONARY ANGIOGRAPHY possible PCI;  Surgeon: Minna Merritts, MD;  Location: [place] [facility] LAB;  Service: Cardiovascular;  Laterality: N/A;  . MELANOMA EXCISION  2016   Dr. Evorn Gong  . [place]  2002  . RIGHT OOPHORECTOMY    . SHOULDER ARTHROSCOPY WITH OPEN ROTATOR CUFF REPAIR Right 01/07/2016   Procedure: SHOULDER ARTHROSCOPY WITH DEBRIDMENT, SUBACHROMIAL DECOMPRESSION;  Surgeon: Corky Mull, MD;  Location: [facility];  Service: Orthopedics;  Laterality: Right;  . SHOULDER ARTHROSCOPY WITH OPEN ROTATOR CUFF REPAIR Right 03/16/2017   Procedure: SHOULDER ARTHROSCOPY WITH OPEN ROTATOR CUFF REPAIR POSSIBLE BICEPS TENODESIS;  Surgeon: Corky Mull, MD;  Location: [facility];  Service: Orthopedics;  Laterality: Right;  . TRIGGER FINGER RELEASE Right     Middle Finger     Home Meds: Prior to Admission medications   Medication Sig Start Date End Date Taking? Authorizing Provider  acarbose (PRECOSE) 25 MG tablet Take 25 mg by mouth 3 (three) times daily before meals.  09/15/19  Yes [provider]  ALPRAZolam Duanne Moron) 1 MG tablet Take 1 tablet by mouth twice daily as needed Patient taking differently: Take 1 mg by mouth 2 (two) times daily as needed for anxiety.  08/14/19  Yes [name], MD  amLODipine (NORVASC) 2.5 MG tablet Take 1 tablet (2.5 mg total) by mouth daily. Patient taking differently: Take 2.5 mg by mouth every evening.  10/25/19  Yes Gollan, Kathlene November, MD  aspirin 81 MG EC tablet Take 81 mg by mouth daily.    Yes [provider]  buPROPion (WELLBUTRIN XL) 300 MG 24 hr tablet Take 1 tablet by mouth once daily Patient taking differently: Take 300 mg by mouth daily.  10/21/19  Yes [name], MD  carvedilol (COREG) 12.5 MG tablet Take 1 tablet (12.5 mg total) by mouth 2 (two) times daily with a meal. 10/20/19  Yes Dwyane Dee, MD  clopidogrel (PLAVIX) 75 MG tablet Take 1 tablet (75 mg total) by mouth daily. 10/10/19 11/09/19 Yes Gollan, Kathlene November, MD    EUTHYROX 25 MCG tablet TAKE 1 TABLET BY MOUTH ONCE DAILY BEFORE BREAKFAST Patient taking differently: Take 25 mcg by mouth daily before breakfast.  10/27/19  Yes [name], MD  gabapentin (NEURONTIN) 300 MG capsule Take 1 capsule (300 mg total) by mouth 2 (two) times daily. 10/12/18  Yes [name], MD  isosorbide mononitrate (IMDUR) 120 MG 24 hr tablet Take 1 tablet (120 mg total) by mouth daily. 10/21/19  Yes Girguis,  Shanon Brow, MD  nitroGLYCERIN (NITROSTAT) 0.4 MG SL tablet Place 1 tablet (0.4 mg total) under the tongue every 5 (five) minutes as needed for chest pain. 10/20/19  Yes Dwyane Dee, MD  pantoprazole (PROTONIX) 40 MG tablet Take 1 tablet by mouth twice daily Patient taking differently: Take 40 mg by mouth 2 (two) times daily.  06/06/19  Yes [name], MD  promethazine (PHENERGAN) 25 MG tablet Take 25 mg by mouth every 6 (six) hours as needed for nausea or vomiting.  07/25/19  Yes [provider]  propranolol ER (INDERAL LA) 60 MG 24 hr capsule Take 60 mg by mouth at bedtime.   Yes [provider]  rosuvastatin (CRESTOR) 40 MG tablet Take 1 tablet (40 mg total) by mouth daily at 6 PM. 10/25/19  Yes Gollan, Kathlene November, MD  traZODone (DESYREL) 100 MG tablet Take 100-200 mg by mouth at bedtime as needed for sleep.  03/06/19  Yes [provider]  UBRELVY 100 MG TABS Take 1 tablet by mouth 2 (two) times daily as needed (migraine).  09/13/19  Yes [provider]  venlafaxine XR (EFFEXOR-XR) 150 MG 24 hr capsule Take 1 capsule (150 mg total) by mouth daily with breakfast. 10/23/19  Yes [name], MD    Inpatient Medications: Scheduled Meds: . aspirin EC  81 mg Oral Daily  . buPROPion  300 mg Oral Daily  . clopidogrel  75 mg Oral Daily  . dextrose  12.5 g Intravenous STAT  . dextrose      . gabapentin  300 mg Oral BID  . insulin aspart  0-5 Units Subcutaneous QHS  . insulin aspart  0-9 Units Subcutaneous TID WC  . levothyroxine  25 mcg  Oral QAC breakfast  . pantoprazole  40 mg Oral BID  . propranolol ER  60 mg Oral QHS  . rosuvastatin  40 mg Oral q1800  . venlafaxine XR  150 mg Oral Q breakfast   Continuous Infusions: . sodium chloride 75 mL/hr at 11/06/19 0818  . heparin 1,200 Units/hr (11/06/19 1152)   PRN Meds: acetaminophen, ALPRAZolam, morphine injection, nitroGLYCERIN, ondansetron, traZODone, Ubrogepant  Allergies:   Allergies  Allergen Reactions  . Lipitor [Atorvastatin] Other (See Comments)    Leg pains  . Tramadol Other (See Comments)    Mouth feels like it's on fire    Social History:   Social History   Socioeconomic History  . Marital status: Divorced    Spouse name: Not on file  . Number of children: 1  . Years of education: Not on file  . Highest education level: High school graduate  Occupational History  . 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 Disabled through 08/02/2013 per forms 02/03/2013  Tobacco Use  . Smoking status: Former Smoker    Types: Cigarettes    Quit date: 08/31/1994    Years since quitting: 25.2  . Smokeless tobacco: Never Used  . Tobacco comment: quit 28 years ago  Vaping Use  . Vaping Use: Never used  Substance and Sexual Activity  . Alcohol use: No    Alcohol/week: 0.0 standard drinks  . Drug use: No  . Sexual activity: Not Currently    Birth control/protection: None, Surgical    Comment: Ablation  Other Topics Concern  . Not on file  Social History Narrative   Previously did custolial work. Disabled as of 05/25/2012 due to CVA causing LUE  and LLE weakness.   Lives at home with son   Social Determinants of Health   Financial Resource Strain: Low Risk   . Difficulty of Paying Living Expenses: Not very hard  Food Insecurity: No Food Insecurity  . Worried About Charity fundraiser in the Last Year: Never true  . Ran Out of Food in the Last Year: Never true    Transportation Needs: No Transportation Needs  . Lack of Transportation (Medical): No  . Lack of Transportation (Non-Medical): No  Physical Activity: Inactive  . Days of Exercise per Week: 0 days  . Minutes of Exercise per Session: 0 min  Stress: Stress Concern Present  . Feeling of Stress : Very much  Social Connections: Moderately Isolated  . Frequency of Communication with Friends and Family: More than three times a week  . Frequency of Social Gatherings with Friends and Family: More than three times a week  . Attends Religious Services: More than 4 times per year  . Active Member of Clubs or Organizations: No  . Attends Archivist Meetings: Never  . Marital Status: Divorced  Human resources officer Violence: Not At Risk  . Fear of Current or Ex-Partner: No  . Emotionally Abused: No  . Physically Abused: No  . Sexually Abused: No     Family History:   Family History  Problem Relation Age of Onset  . Hypertension Mother   . Anxiety disorder Mother   . Depression Mother   . Bipolar disorder Mother   . Heart disease Mother        No details  . Hyperlipidemia Mother   . Kidney disease Father   . Heart disease Father 110  . Hypertension Father   . Diabetes Father   . Stroke Father   . Colon cancer Father        dx in his 14's  . Anxiety disorder Father   . Depression Father   . Skin cancer Father   . Kidney disease Sister   . Thyroid nodules Sister   . Hypertension Sister   . Hypertension Sister   . Diabetes Sister   . Hyperlipidemia Sister   . Depression Sister   . Breast cancer Maternal Aunt 31  . Breast cancer Maternal Aunt 54  . Ovarian cancer Cousin   . Colon cancer Cousin   . Kidney cancer Cousin   . Breast cancer Other   . Bladder Cancer Neg Hx     ROS:  Review of Systems  Constitutional: Positive for malaise/fatigue. Negative for chills, diaphoresis, fever and weight loss.  HENT: Negative for congestion.   Eyes: Negative for discharge and  redness.  Respiratory: Positive for shortness of breath. Negative for cough, sputum production and wheezing.   Cardiovascular: Positive for chest pain. Negative for palpitations, orthopnea, claudication, leg swelling and PND.  Gastrointestinal: Negative for abdominal pain, heartburn, nausea and vomiting.  Musculoskeletal: Negative for falls and myalgias.  Skin: Negative for rash.  Neurological: Positive for dizziness, loss of consciousness and weakness. Negative for tingling, tremors, sensory change, speech change and focal weakness.  Endo/Heme/Allergies: Does not bruise/bleed easily.  Psychiatric/Behavioral: Negative for substance abuse. The patient is not nervous/anxious.   All other systems reviewed and are negative.     Physical Exam/Data:   Vitals:   11/06/19 0814 11/06/19 0856 11/06/19 0900 11/06/19 1142  BP: (!) 161/74 92/70 (!) 186/72 (!) 161/61  Pulse: (!) 55 (!) 55  (!) 57  Resp: _0 Temp:  97.6 F (36.4 C) 98 F (36.7 C)  98.6 F (37 C)  TempSrc: Oral Oral  Oral  SpO2: 97% 98%  100%  Weight:      Height:       No intake or output data in the 24 hours ending 11/06/19 1249 Filed Weights   11/05/19 1242  Weight: 63.5 kg   Body mass index is 25.61 kg/m.   Physical Exam: General: Well developed, well nourished, in no acute distress. Head: Normocephalic, atraumatic, sclera non-icteric, no xanthomas, nares without discharge.  Neck: Negative for carotid bruits. JVD not elevated. Lungs: Clear bilaterally to auscultation without wheezes, rales, or rhonchi. Breathing is unlabored. Heart: RRR with S1 S2. No murmurs, rubs, or gallops appreciated. Abdomen: Soft, non-tender, non-distended with normoactive bowel sounds. No hepatomegaly. No rebound/guarding. No obvious abdominal masses. Msk:  Strength and tone appear normal for age. Extremities: No clubbing or cyanosis. No edema. Distal pedal pulses are 2+ and equal bilaterally. Neuro: Alert and oriented X 3. No facial  asymmetry. No focal deficit. Moves all extremities spontaneously. Psych:  Responds to questions appropriately with a normal affect.   EKG:  The EKG was personally reviewed and demonstrates: NSR, 63 bpm, early repolarization abnormalities  Telemetry:  Telemetry was personally reviewed and demonstrates: SR  Weights: [employer]   11/05/19 1242  Weight: 63.5 kg    Relevant CV Studies:  LHC 04/20/2019: Coronary dominance: Right  Left mainstem: Large vessel that bifurcates into the LAD and left circumflex, no significant disease noted  Left anterior descending (LAD): Large vessel that extends to the apical region, diagonal branch #1 of moderate size with patent stent , diagonal #2 small to moderate in size with severe ostial/proximal disease, severe distal disease estimated 80%  Left circumflex (LCx): Large vessel with OM branch 2, mild to moderate mid vessel disease  Right coronary artery (RCA): Right dominant vessel with PL and PDA, no significant disease noted , very mild mid vessel disease  Left ventriculography: Left ventricular systolic function is normal, LVEF is estimated at 55-65%, there is no significant mitral regurgitation , no significant aortic valve stenosis  Final Conclusions:  In comparison to images from catheterization 2019 no significant change Severe native coronary disease, Patent stents LAD and diagonal vessel Stable severe ostial/proximal diagonal #2 disease, no change from previous catheterization 2019, not amenable to stenting Distal LAD is small and tapers, severe disease, not amenable to stenting  Recommendations:  Continue aggressive medical management, LDL less than 70, aggressive diabetes control, antianginals Of note left ventricular end-diastolic pressure was low consistent with dehydration, would continue IV fluids __________  2D echo 01/2019: 1. Left ventricular ejection fraction, by visual estimation, is 60 to  65%. The left ventricle  has normal function. There is no left ventricular  hypertrophy.  2. Left ventricular diastolic parameters are consistent with Grade I  diastolic dysfunction (impaired relaxation).  3. The left ventricle has no regional wall motion abnormalities.  4. Global right ventricle has normal systolic function.The right  ventricular size is normal. No increase in right ventricular wall  thickness.  5. Left atrial size was normal.  6. TR signal is inadequate for assessing pulmonary artery systolic  pressure.  __________  2D echo 11/06/2019: 1. Left ventricular ejection fraction, by estimation, is 60 to 65%. The  left ventricle has normal function. The left ventricle has no regional  wall motion abnormalities. There is mild left ventricular hypertrophy.  Left ventricular diastolic parameters  are consistent with Grade I diastolic dysfunction (  impaired relaxation).  2. Right ventricular systolic function was not well visualized. The right  ventricular size is mildly enlarged.  3. Left atrial size was mildly dilated.  4. Right atrial size was mildly dilated.  5. The mitral valve is grossly normal. Trivial mitral valve  regurgitation.  6. The aortic valve was not well visualized. Aortic valve regurgitation  is not visualized.    Laboratory Data:  Chemistry Recent Labs  Lab 11/05/19 1247 11/06/19 0807  NA 139 140  K 5.1 4.5  CL 108 108  CO2 23 24  GLUCOSE 94 77  BUN 27* 25*  CREATININE 2.08* 1.95*  CALCIUM 8.9 8.8*  GFRNONAA 27* 29*  GFRAA 31* 34*  ANIONGAP 8 8    No results for input(s): PROT, ALBUMIN, AST, ALT, ALKPHOS, BILITOT in the last 168 hours. Hematology Recent Labs  Lab 11/05/19 1247 11/06/19 0519  WBC 7.3 6.4  RBC 4.11 3.71*  HGB 11.5* 10.0*  HCT 34.3* 31.8*  MCV 83.5 85.7  MCH 28.0 27.0  MCHC 33.5 31.4  RDW 13.9 13.8  PLT 227 192   Cardiac EnzymesNo results for input(s): TROPONINI in the last 168 hours. No results for input(s): TROPIPOC in the last  168 hours.  BNP Recent Labs  Lab 11/05/19 1247  BNP 24.9    DDimer No results for input(s): DDIMER in the last 168 hours.  Radiology/Studies:  CT HEAD WO CONTRAST  Result Date: 11/05/2019 IMPRESSION: 1. No acute intracranial findings. 2. Chronic small vessel ischemic change and remote infarcts. Electronically Signed   By: Audie Pinto M.D.   On: 11/05/2019 16:31   MR BRAIN WO CONTRAST  Result Date: 11/05/2019 IMPRESSION: 1. No acute intracranial abnormality. 2. Diffuse confluent hyperintense T2-weighted signal within the periventricular, deep and juxtacortical white matter, consistent with chronic ischemic microangiopathy. Electronically Signed   By: Ulyses Jarred M.D.   On: 11/05/2019 21:15   US Venous Img Lower Bilateral (DVT)  Result Date: 11/05/2019 IMPRESSION: Negative. Electronically Signed   By: Virgina Norfolk M.D.   On: 11/05/2019 18:15   DG Chest Portable 1 View  Result Date: 11/05/2019 IMPRESSION: No acute cardiopulmonary abnormality. 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 Electronically Signed   By: Genevie Ann M.D.   On: 11/05/2019 13:14   Assessment and Plan:   1.  CAD involving the native coronary arteries with chronic angina: -Currently, continues to note chronic angina -High-sensitivity troponin negative x4 with EKG showing no acute ischemic changes -Echo this admission demonstrated a preserved LVSF with no regional wall motion abnormalities -She most recently underwent LHC in 04/2019 which showed patent stents in the LAD and diagonal with stable severe ostial to proximal D2 disease that was unchanged from cath in 2019 and not amenable to stenting due to increased risk for worsening her LAD stenosis. Her distal LAD was small and tapered to severe disease which also was not amenable to stenting. Medical management was advised. And, in this setting, Imdur was titrated to 120 mg daily, and she was restarted on carvedilol and amlodipine -From a  cardiac perspective there is no indication for heparin drip given she has ruled out however, she was noted to have an elevated D-dimer with VQ scan pending.  In this setting we will defer management of her heparin drip to internal medicine -ASA, Coreg, Crestor, Imdur -Previously refused Imdur -Recently started back on low dose amlodipine, though she indicates this did not help -No plans for inpatient ischemic evaluation at this  time with normal HS-Tn -Consider outpatient LHC after discussion with MD  2.  Syncope: -Cannot exclude hypoglycemia given her recent hospital admissions for similar issue and documented hypoglycemic episodes this admission -[facility] placed a pulse ox on the patient's finger with a reported O2 saturation in the 80s on room air and heart rate of 250 bpm, details of this are not available for review -Noted to have an elevated D-dimer upon admission with VQ scan pending -Continue to monitor on telemetry -Echo with no significant structural abnormalities as above -CT head/MRI brain not acute  -Consider outpatient Zio  3. HFrEF secondary to ICM: -LVSF has normalized by echo in 01/2019 which continues to be normal this admission -Not requiring a standing diuretic  -Continue Coreg as vitals allow  4.  Acute on CKD stage III: -Gentle hydration -Per IM  5.  Hypoglycemia: -Likely contributing to her overall presentation -Management per internal medicine  6.  HLD: -LDL of 48 this admission -Continue PTA Crestor  7.  Labile hypertension: -BP has ranged from the 88T systolic to the 254D systolic -With this, escalation of antianginal therapy is somewhat limited -Continue current medical therapy    For questions or updates, please contact [address] Please consult [email or web address] for contact info under Cardiology/STEMI.   Signed, Christell Faith, PA-C [place] Pager: [phone] 11/06/2019, 12:49 PM

## 2019-11-06 NOTE — Consult Note (Signed)
[facility] for Heparin Drip Indication: possible pulmonary embolus  Allergies  Allergen Reactions  . Lipitor [Atorvastatin] Other (See Comments)    Leg pains  . Tramadol Other (See Comments)    Mouth feels like it's on fire    Patient Measurements: Height: _0  (157.5 cm) Weight: 63.5 kg (140 lb) IBW/kg (Calculated) : 50.1 Heparin Dosing Weight: 62.9kg  Vital Signs: Temp: 98 F (36.7 C) (10/04 2023) Temp Source: Oral (10/04 2023) BP: 121/71 (10/04 2023) Pulse Rate: 63 (10/04 2023)  Labs: Recent Labs    11/05/19 1247 11/05/19 1247 11/05/19 1457 11/05/19 1630 11/05/19 2204 11/05/19 2204 11/06/19 0519 11/06/19 0807 11/06/19 1251 11/06/19 2112  HGB 11.5*  --   --   --   --   --  10.0*  --   --   --   HCT 34.3*  --   --   --   --   --  31.8*  --   --   --   PLT 227  --   --   --   --   --  192  --   --   --   APTT  --   --   --  39*  --   --   --   --   --   --   LABPROT  --   --   --  13.3  --   --   --   --   --   --   INR  --   --   --  1.1  --   --   --   --   --   --   HEPARINUNFRC  --   --   --   --  0.38   < > 0.30  --  0.16* 0.27*  CREATININE 2.08*  --   --   --   --   --   --  1.95*  --   --   TROPONINIHS 7   < > _1 --   --   --   --   --    < > = values in this interval not displayed.    Estimated Creatinine Clearance: 29.9 mL/min (A) (by C-G formula based on SCr of 1.95 mg/dL (H)).  Medical History: Past Medical History:  Diagnosis Date  . Acute colitis 01/27/2017  . Acute pyelonephritis   . Anemia    iron deficiency anemia  . 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 BRCA negative 2014  . CAD (coronary artery disease)    a. 08/2003 Cath: LAD 30-40-med Rx; b. 11/2014 PCI: LAD 5m(3.25x23 Xience Alpine DES); c. 06/2015 PCI: D1 (2.25x12 Resolute Integrity DES); d. 06/2017 PCI: Patent mLAD stent, D2 95 (PTCA); e. 09/2017 PCI: D2 99ost (CBA); d. 12/2017 Cath: LM nl, LAD 388m80d (small), D1 40ost, D2 95ost, LCX 40p, RCA  40ost/p->Med rx for D2 given restenosis.  . Colitis 06/03/2015  . Colon polyp   . CVA (cerebral vascular accident) (HCR[place]   Left side weakness.   . Degenerative tear of glenoid labrum of right shoulder 03/15/2017  . Diabetes mellitus without complication (HCDay[place]  . Family history of breast cancer    BRCA neg 2014  . Femur fracture, left (HCBradfordsville9/10/2018  . Gastric ulcer 04/27/2011  . History of echocardiogram    a. 03/2017 Echo: EF 60-65%, no rwma; b. 02/2018 Echo: EF 60-65%, no rwma.  Nl RV fxn. No cardiac source of emboli (admitted w/ stroke).  . Hypertension   . Malignant melanoma of skin of scalp ([place])   . MI, acute, non ST segment elevation ([place])   . Neuromuscular disorder ([place])   . S/P drug eluting coronary stent placement 06/04/2015  . Sepsis ([place]) 03/18/2017  . Stroke [facility])    a. 02/2018 MRI: 27m late acute/early subacute L medial frontal lobe inarct; b. 02/2018 MRA No large vessel occlusion or aneurysm. Mod to sev L P2 stenosis. thready L vertebral artery, diffusely dzs'd; c. 02/2018 Carotid U/S: <50% bilat ICA dzs.    Medications:  No PTA anticoagulation of record  Assessment: DOnidais a 49y.o. female with a past medical history of CAD with 2 stents in place, hypertension, hyperlipidemia, prior CVA, and type 2 diabetes who presents with chest pain and an elevated D-dimer.     Pharmacy has been consulted to initiate and monitor a heparin drip for possible PE. NM pulmonary perfusion completed 10/4. F/u with results.   10/3 2204 HL 0.38  10/4 0519 HL 0.3  10/4 1251 HL 0.16 - heparin was paused when pt went to NM.  10/4 2112 HL 0.27  Goal of Therapy:  Heparin level 0.3-0.7 units/ml Monitor platelets by anticoagulation protocol: Yes   Plan:  Heparin level slightly subtherapeutic. Bolus heparin 800 units and increase heparin drip to 1400 units/hr. Recheck HL 10/5 at 0700. CBC daily while on heparin.   ATawnya Crook PharmD Clinical Pharmacist 11/06/2019 9:53  PM

## 2019-11-06 NOTE — Progress Notes (Signed)
Patient arrived from ED. Heparin going at 10mL/hr. Patient's diet changed to cardiac/carb modified. Patient resting comfortably in bed at this time with no needs at this time.

## 2019-11-06 NOTE — Progress Notes (Signed)
*  PRELIMINARY RESULTS* Echocardiogram 2D Echocardiogram has been performed.  Sherrie Sport 11/06/2019, 11:06 AM

## 2019-11-06 NOTE — Consult Note (Signed)
ANTICOAGULATION CONSULT NOTE -  Pharmacy Consult for Heparin Drip Indication: possible pulmonary embolus  Allergies  Allergen Reactions  . Lipitor [Atorvastatin] Other (See Comments)    Leg pains  . Tramadol Other (See Comments)    Mouth feels like it's on fire    Patient Measurements: Height: '5\' 2"'  (157.5 cm) Weight: 63.5 kg (140 lb) IBW/kg (Calculated) : 50.1 Heparin Dosing Weight: 62.9kg  Vital Signs: Temp: 97.7 F (36.5 C) (10/03 2119) Temp Source: Oral (10/03 2119) BP: 126/71 (10/04 0500) Pulse Rate: 54 (10/04 0500)  Labs: Recent Labs    11/05/19 1247 11/05/19 1247 11/05/19 1457 11/05/19 1630 11/05/19 2204 11/06/19 0519  HGB 11.5*  --   --   --   --  10.0*  HCT 34.3*  --   --   --   --  31.8*  PLT 227  --   --   --   --  192  APTT  --   --   --  39*  --   --   LABPROT  --   --   --  13.3  --   --   INR  --   --   --  1.1  --   --   HEPARINUNFRC  --   --   --   --  0.38 0.30  CREATININE 2.08*  --   --   --   --   --   TROPONINIHS 7   < > '7 7 7  ' --    < > = values in this interval not displayed.    Estimated Creatinine Clearance: 28 mL/min (A) (by C-G formula based on SCr of 2.08 mg/dL (H)).  Medical History: Past Medical History:  Diagnosis Date  . Acute colitis 01/27/2017  . Acute pyelonephritis   . Anemia    iron deficiency anemia  . 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 BRCA negative 2014  . CAD (coronary artery disease)    a. 08/2003 Cath: LAD 30-40-med Rx; b. 11/2014 PCI: LAD 33m(3.25x23 Xience Alpine DES); c. 06/2015 PCI: D1 (2.25x12 Resolute Integrity DES); d. 06/2017 PCI: Patent mLAD stent, D2 95 (PTCA); e. 09/2017 PCI: D2 99ost (CBA); d. 12/2017 Cath: LM nl, LAD 341m80d (small), D1 40ost, D2 95ost, LCX 40p, RCA 40ost/p->Med rx for D2 given restenosis.  . Colitis 06/03/2015  . Colon polyp   . CVA (cerebral vascular accident) (HCSiascon[place]   Left side weakness.   . Degenerative tear of glenoid labrum of right shoulder 03/15/2017  . Diabetes mellitus without  complication (HCSeverance  . Family history of breast cancer    BRCA neg 2014  . Femur fracture, left (HCOneonta9/10/2018  . Gastric ulcer 04/27/2011  . History of echocardiogram    a. 03/2017 Echo: EF 60-65%, no rwma; b. 02/2018 Echo: EF 60-65%, no rwma. Nl RV fxn. No cardiac source of emboli (admitted w/ stroke).  . Hypertension   . Malignant melanoma of skin of scalp (HCLebanon  . MI, acute, non ST segment elevation (HCCedar [place]  . Neuromuscular disorder (HCPacific [place]  . S/P drug eluting coronary stent placement 06/04/2015  . Sepsis (HCTusayan2/14/2019  . [facility]   a. 02/2018 MRI: 4m73mate acute/early subacute L medial frontal lobe inarct; b. 02/2018 MRA No large vessel occlusion or aneurysm. Mod to sev L P2 stenosis. thready L vertebral artery, diffusely dzs'd; c. 02/2018 Carotid U/S: <50% bilat ICA dzs.    Medications:  No PTA  anticoagulation of record  Assessment: [name] is a 50 y.o. female with a past medical history of CAD with 2 stents in place, hypertension, hyperlipidemia, prior CVA, and type 2 diabetes who presents with chest pain and an elevated D-dimer.     Pharmacy has been consulted to initiate and monitor a heparin drip for possible PE.  Goal of Therapy:  Heparin level 0.3-0.7 units/ml Monitor platelets by anticoagulation protocol: Yes   Plan:  Will obtain baseline APTT and INR labs.  Give 4000 units bolus x 1 Start heparin infusion at 1100 units/hr Check anti-Xa level in 6 hours and daily while on heparin Continue to monitor H&H and platelets   1003 2204 HL 0.38, therapeutic.  Continue current heparin rate and recheck HL and CBC in am  1004 0579 HL 0.30, therapeutic on low end of range and trending down.  H/H worse, PLTs OK.  Will increase Heparin to 1200 units/hr and recheck HL in ~ 6 hours to confirm remains therapeutic.  HL and CBC daily while on heparin  Ena Dawley, PharmD Clinical Pharmacist 11/06/2019 6:35 AM

## 2019-11-06 NOTE — ED Notes (Signed)
AAOx3.  Skin warm and dry no apparent distress

## 2019-11-06 NOTE — ED Notes (Signed)
Patient requesting prn Xanax.

## 2019-11-06 NOTE — ED Notes (Signed)
Hypoglycemia-- CBG 60.  12.5g D50 given.  Patient AAOx3.  Skin warm and dry. NAD.

## 2019-11-06 NOTE — Consult Note (Signed)
ANTICOAGULATION CONSULT NOTE -  Pharmacy Consult for Heparin Drip Indication: possible pulmonary embolus  Allergies  Allergen Reactions  . Lipitor [Atorvastatin] Other (See Comments)    Leg pains  . Tramadol Other (See Comments)    Mouth feels like it's on fire    Patient Measurements: Height: 5' 2" (157.5 cm) Weight: 63.5 kg (140 lb) IBW/kg (Calculated) : 50.1 Heparin Dosing Weight: 62.9kg  Vital Signs: Temp: 98.6 F (37 C) (10/04 1142) Temp Source: Oral (10/04 1142) BP: 161/61 (10/04 1142) Pulse Rate: 57 (10/04 1142)  Labs: Recent Labs    11/05/19 1247 11/05/19 1247 11/05/19 1457 11/05/19 1630 11/05/19 2204 11/06/19 0519 11/06/19 0807 11/06/19 1251  HGB 11.5*  --   --   --   --  10.0*  --   --   HCT 34.3*  --   --   --   --  31.8*  --   --   PLT 227  --   --   --   --  192  --   --   APTT  --   --   --  39*  --   --   --   --   LABPROT  --   --   --  13.3  --   --   --   --   INR  --   --   --  1.1  --   --   --   --   HEPARINUNFRC  --   --   --   --  0.38 0.30  --  0.16*  CREATININE 2.08*  --   --   --   --   --  1.95*  --   TROPONINIHS 7   < > _0 --   --   --    < > = values in this interval not displayed.    Estimated Creatinine Clearance: 29.9 mL/min (A) (by C-G formula based on SCr of 1.95 mg/dL (H)).  Medical History: Past Medical History:  Diagnosis Date  . Acute colitis 01/27/2017  . Acute pyelonephritis   . Anemia    iron deficiency anemia  . 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 BRCA negative 2014  . CAD (coronary artery disease)    a. 08/2003 Cath: LAD 30-40-med Rx; b. 11/2014 PCI: LAD 46m(3.25x23 Xience Alpine DES); c. 06/2015 PCI: D1 (2.25x12 Resolute Integrity DES); d. 06/2017 PCI: Patent mLAD stent, D2 95 (PTCA); e. 09/2017 PCI: D2 99ost (CBA); d. 12/2017 Cath: LM nl, LAD 368m80d (small), D1 40ost, D2 95ost, LCX 40p, RCA 40ost/p->Med rx for D2 given restenosis.  . Colitis 06/03/2015  . Colon polyp   . CVA (cerebral vascular accident) (HCDe [place]    Left side weakness.   . Degenerative tear of glenoid labrum of right shoulder 03/15/2017  . Diabetes mellitus without complication (HCYorkville  . Family history of breast cancer    BRCA neg 2014  . Femur fracture, left (HCKaktovik9/10/2018  . Gastric ulcer 04/27/2011  . History of echocardiogram    a. 03/2017 Echo: EF 60-65%, no rwma; b. 02/2018 Echo: EF 60-65%, no rwma. Nl RV fxn. No cardiac source of emboli (admitted w/ stroke).  . Hypertension   . Malignant melanoma of skin of scalp (HCAvery  . MI, acute, non ST segment elevation (HCOneida Castle  . Neuromuscular disorder (HCMonona  . S/P drug eluting coronary stent placement 06/04/2015  . Sepsis (HCSweetwater2/14/2019  .  Stroke (H[facility])    a. 02/2018 MRI: 7mm late acute/early subacute L medial frontal lobe inarct; b. 02/2018 MRA No large vessel occlusion or aneurysm. Mod to sev L P2 stenosis. thready L vertebral artery, diffusely dzs'd; c. 02/2018 Carotid U/S: <50% bilat ICA dzs.    Medications:  No PTA anticoagulation of record  Assessment: [name] is a 49 y.o. female with a past medical history of CAD with 2 stents in place, hypertension, hyperlipidemia, prior CVA, and type 2 diabetes who presents with chest pain and an elevated D-dimer.     Pharmacy has been consulted to initiate and monitor a heparin drip for possible PE. NM pulmonary perfusion completed 10/4. F/u with results.   10/3 2204 HL 0.38  10/4 0519 HL 0.3  10/4 1251 HL 0.16 - heparin was paused when pt went to [place].   Goal of Therapy:  Heparin level 0.3-0.7 units/ml Monitor platelets by anticoagulation protocol: Yes   Plan:  Heparin level was subtherapuetic but the heparin was paused. Will increase heparin rate to 1300 units/hr as the previous heparin levels were on the lower end of of goal. Recheck HL in 6 hours. If Heparin level returns therapeutic, recommend going to daily heparin level checks. CBC daily while on heparin.    S , PharmD, BCPS Clinical Pharmacist 11/06/2019 2:03  PM  

## 2019-11-07 DIAGNOSIS — I2699 Other pulmonary embolism without acute cor pulmonale: Secondary | ICD-10-CM

## 2019-11-07 DIAGNOSIS — I25119 Atherosclerotic heart disease of native coronary artery with unspecified angina pectoris: Secondary | ICD-10-CM

## 2019-11-07 LAB — BASIC METABOLIC PANEL
Anion gap: 8 (ref 5–15)
BUN: 26 mg/dL — ABNORMAL HIGH (ref 6–20)
CO2: 22 mmol/L (ref 22–32)
Calcium: 8.7 mg/dL — ABNORMAL LOW (ref 8.9–10.3)
Chloride: 113 mmol/L — ABNORMAL HIGH (ref 98–111)
Creatinine, Ser: 1.48 mg/dL — ABNORMAL HIGH (ref 0.44–1.00)
GFR calc Af Amer: 47 mL/min — ABNORMAL LOW (ref >60)
GFR calc non Af Amer: 41 mL/min — ABNORMAL LOW (ref >60)
Glucose, Bld: 71 mg/dL (ref 70–99)
Potassium: 4.5 mmol/L (ref 3.5–5.1)
Sodium: 143 mmol/L (ref 135–145)

## 2019-11-07 LAB — GLUCOSE, CAPILLARY
Glucose-Capillary: 24 mg/dL — CL (ref 70–99)
Glucose-Capillary: 54 mg/dL — ABNORMAL LOW (ref 70–99)
Glucose-Capillary: 92 mg/dL (ref 70–99)
Glucose-Capillary: 155 mg/dL — ABNORMAL HIGH (ref 70–99)
Glucose-Capillary: 140 mg/dL — ABNORMAL HIGH (ref 70–99)
Glucose-Capillary: 48 mg/dL — ABNORMAL LOW (ref 70–99)
Glucose-Capillary: 55 mg/dL — ABNORMAL LOW (ref 70–99)

## 2019-11-07 LAB — CBC
HCT: 33.7 % — ABNORMAL LOW (ref 36.0–46.0)
Hemoglobin: 11.1 g/dL — ABNORMAL LOW (ref 12.0–15.0)
MCH: 27.4 pg (ref 26.0–34.0)
MCHC: 32.9 g/dL (ref 30.0–36.0)
MCV: 83.2 fL (ref 80.0–100.0)
Platelets: 177 10*3/uL (ref 150–400)
RBC: 4.05 MIL/uL (ref 3.87–5.11)
RDW: 13.8 % (ref 11.5–15.5)
WBC: 7.1 10*3/uL (ref 4.0–10.5)
nRBC: 0 % (ref 0.0–0.2)

## 2019-11-07 LAB — HEPARIN LEVEL (UNFRACTIONATED): Heparin Unfractionated: 0.67 IU/mL (ref 0.30–0.70)

## 2019-11-07 MED ORDER — APIXABAN 5 MG PO TABS: 5.0000 mg | ORAL_TABLET | Freq: Two times a day (BID) | ORAL | Status: DC

## 2019-11-07 MED ORDER — APIXABAN (ELIQUIS) VTE STARTER PACK (10MG AND 5MG): ORAL_TABLET | ORAL | 0 refills | Status: DC

## 2019-11-07 MED ORDER — APIXABAN 5 MG PO TABS
10.0000 mg | ORAL_TABLET | Freq: Two times a day (BID) | ORAL | Status: DC
  Administered 2019-11-07: 10 mg via ORAL
  Filled 2019-11-07: qty 2

## 2019-11-07 NOTE — Consult Note (Signed)
[name] for Heparin Drip Indication: possible pulmonary embolus  Allergies  Allergen Reactions  . Lipitor [Atorvastatin] Other (See Comments)    Leg pains  . Tramadol Other (See Comments)    Mouth feels like it's on fire    Patient Measurements: Height: _0  (157.5 cm) Weight: 69.1 kg (152 lb 6.4 oz) IBW/kg (Calculated) : 50.1 Heparin Dosing Weight: 62.9kg  Vital Signs: Temp: 98.2 F (36.8 C) (10/05 0738) Temp Source: Oral (10/05 0738) BP: 188/86 (10/05 0738) Pulse Rate: 66 (10/05 0738)  Labs: Recent Labs     0000 11/05/19 1247 11/05/19 1247 11/05/19 1457 11/05/19 1630 11/05/19 2204 11/05/19 2204 11/06/19 0519 11/06/19 0519 11/06/19 0807 11/06/19 1251 11/06/19 2112 11/07/19 0500 11/07/19 0651  HGB   < > 11.5*  --   --   --   --   --  10.0*  --   --   --   --  11.1*  --   HCT  --  34.3*  --   --   --   --   --  31.8*  --   --   --   --  33.7*  --   PLT  --  227  --   --   --   --   --  192  --   --   --   --  177  --   APTT  --   --   --   --  39*  --   --   --   --   --   --   --   --   --   LABPROT  --   --   --   --  13.3  --   --   --   --   --   --   --   --   --   INR  --   --   --   --  1.1  --   --   --   --   --   --   --   --   --   HEPARINUNFRC  --   --   --   --   --  0.38   < > 0.30   < >  --  0.16* 0.27*  --  0.67  CREATININE  --  2.08*  --   --   --   --   --   --   --  1.95*  --   --  1.48*  --   TROPONINIHS  --  7   < > _1 --   --   --   --   --   --   --   --    < > = values in this interval not displayed.    Estimated Creatinine Clearance: 41 mL/min (A) (by C-G formula based on SCr of 1.48 mg/dL (H)).  Medical History: Past Medical History:  Diagnosis Date  . Acute colitis 01/27/2017  . Acute pyelonephritis   . Anemia    iron deficiency anemia  . 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 BRCA negative 2014  . CAD (coronary artery disease)    a. 08/2003 Cath: LAD 30-40-med Rx; b. 11/2014 PCI: LAD 17m (3.25x23 Xience Alpine DES); c. 06/2015 PCI: D1 ((0.63K16Resolute Integrity DES); d. 06/2017 PCI: Patent mLAD stent, D2 95 (PTCA); e. 09/2017 PCI: D2 99ost (CBA); d.  12/2017 Cath: LM nl, LAD 71m 80d (small), D1 40ost, D2 95ost, LCX 40p, RCA 40ost/p->Med rx for D2 given restenosis.  . Colitis 06/03/2015  . Colon polyp   . CVA (cerebral vascular accident) (HLewiston    Left side weakness.   . Degenerative tear of glenoid labrum of right shoulder 03/15/2017  . Diabetes mellitus without complication (HDry[place]   . Family history of breast cancer    BRCA neg 2014  . Femur fracture, left (HBurnsville 10/12/2018  . Gastric ulcer 04/27/2011  . History of echocardiogram    a. 03/2017 Echo: EF 60-65%, no rwma; b. 02/2018 Echo: EF 60-65%, no rwma. Nl RV fxn. No cardiac source of emboli (admitted w/ stroke).  . Hypertension   . Malignant melanoma of skin of scalp (HSpring Valley Lake   . MI, acute, non ST segment elevation (HSteamb[place]   . Neuromuscular disorder (HFreeport   . S/P drug eluting coronary stent placement 06/04/2015  . Sepsis (HOak[place] 03/18/2017  . Stroke ([facility]    a. 02/2018 MRI: 731mlate acute/early subacute L medial frontal lobe inarct; b. 02/2018 MRA No large vessel occlusion or aneurysm. Mod to sev L P2 stenosis. thready L vertebral artery, diffusely dzs'd; c. 02/2018 Carotid U/S: <50% bilat ICA dzs.    Medications:  No PTA anticoagulation of record  Assessment: [name] a [age].o. female with a past medical history of CAD with 2 stents in place, hypertension, hyperlipidemia, prior CVA, and type 2 diabetes who presents with chest pain and an elevated D-dimer.     Pharmacy has been consulted to initiate and monitor a heparin drip for possible PE. NM pulmonary perfusion completed 10/4. F/u with results.   10/3 2204 HL 0.38  10/4 0519 HL 0.3  10/4 1251 HL 0.16 - heparin was paused when pt went to [place].  10/4 2112 HL 0.27 10/5 0651 HL 0.67   Goal of Therapy:  Heparin level 0.3-0.7 units/ml Monitor platelets by  anticoagulation protocol: Yes   Plan:  Heparin level therapeutic. Will continue heparin drip at 1400 units/hr. Recheck HL in 6 hours. CBC daily while on heparin.   KiOswald HillockPharmD, BCPS Clinical Pharmacist 11/07/2019 8:08 AM

## 2019-11-07 NOTE — Progress Notes (Signed)
PT Cancellation Note  Patient Details Name: [name] MRN: [id number] DOB: 01-Mar-1970   Cancelled Treatment:    Reason Eval/Treat Not Completed: Patient not medically ready During chart review, patient diagnosed with PE. She has been on heparin for approximately 40 hours. Per PT guidelines, she needs 48 hours anti-coag before safe for activity. Will re-attempt PT evaluation when patient is medically ready and appropriate. Thank you for this referral.   Vitaliy Eisenhour PT, DPT 11/07/2019, 11:03 AM

## 2019-11-07 NOTE — Progress Notes (Signed)
Hypoglycemic Event  CBG: 54  Treatment: 8 oz juice   Symptoms: Asymptomatic   Follow-up CBG: Time:821 CBG Result:92  Possible Reasons for Event: No breakfast yet this am.  Comments/MD notified:MD notified.     [name]

## 2019-11-07 NOTE — TOC Initial Note (Signed)
Transition of Care [facility]) - Initial/Assessment Note    Patient Details  Name: [name] MRN: [id number] Date of Birth: [birth year]  Transition of [facility]) CM/SW Contact:    Beverly Sessions, RN Phone Number: 11/07/2019, 4:29 PM  Clinical Narrative:                 Patient lives at home with son Son to transport at discharge  Patient states she has a RW and cane in the home for ambulation  PCP [employer] - denies issues with transportation  Patient to discharge on Eliquis provided 30day free trail card  Patient open with [facility].  Corene Cornea with [facility] notified of resumption orders          Patient Goals and CMS Choice        Expected Discharge Plan and Services           Expected Discharge Date: 11/07/19                                    Prior Living Arrangements/Services                       Activities of Daily Living Home Assistive Devices/Equipment: None ADL Screening (condition at time of admission) Patient's cognitive ability adequate to safely complete daily activities?: Yes Is the patient deaf or have difficulty hearing?: No Does the patient have difficulty seeing, even when wearing glasses/contacts?: No Does the patient have difficulty concentrating, remembering, or making decisions?: No Patient able to express need for assistance with ADLs?: Yes Does the patient have difficulty dressing or bathing?: No Independently performs ADLs?: Yes (appropriate for developmental age) Does the patient have difficulty walking or climbing stairs?: Yes Weakness of Legs: Both Weakness of Arms/Hands: Both  Permission Sought/Granted                  Emotional Assessment              Admission diagnosis:  Positive D dimer [R79.89] Chest pain [R07.9] Syncope, unspecified syncope type [R55] Chest pain, unspecified type [R07.9] Patient Active Problem List   Diagnosis Date Noted  .  Pulmonary embolism ([place]) 11/07/2019  . Chest pain 11/05/2019  . Type II diabetes mellitus with renal manifestations ([place]) 11/05/2019  . Acute renal failure superimposed on stage 3a chronic kidney disease ([place]) 09/02/2019  . Hypoglycemia   . Family history of breast cancer 05/23/2019  . Family history of colon cancer 05/23/2019  . Abnormal LFTs 04/18/2019  . Esophageal dysphagia   . Gastrointestinal tract imaging abnormality   . Trochanteric bursitis, left hip 03/17/2019  . Malignant neoplasm of vulva, unspecified ([place]) 03/10/2019  . Peripheral vascular disease, unspecified ([place]) 03/10/2019  . Paroxysmal supraventricular tachycardia ([place]) 03/10/2019  . Contusion of left hip 03/06/2019  . Chest pain, moderate coronary artery risk 01/27/2019  . Melena   . Hypertensive urgency   . Recurrent chest pain 01/19/2019  . Depression, major, single episode, moderate ([place]) 12/09/2018  . Closed nondisplaced intertrochanteric fracture of left femur ([place]) 10/12/2018  . Hypotension 06/26/2018  . Autonomic neuropathy 03/24/2018  . Acute delirium 03/03/2018  . H/O gastric bypass 03/02/2018  . Hypertension associated with stage 3 chronic kidney disease due to type 2 diabetes mellitus ([place]) 02/20/2018  . SI (sacroiliac) joint dysfunction 12/02/2017  . Effort angina ([place]) 06/29/2017  . Unstable  angina ([place]) 06/24/2017  . Syncope 04/09/2017  . Insomnia 03/18/2017  . Ischemic cardiomyopathy   . Arthritis   . Anxiety   . Tendinitis of upper biceps tendon of right shoulder 03/16/2017  . Hx of colonic polyps   . H/O medication noncompliance 12/14/2015  . CAD (coronary artery disease)   . Hypertensive heart disease   . Status post bariatric surgery 06/04/2015  . Carotid stenosis 04/30/2015  . Stable angina pectoris ([place]) 04/17/2015  . Iron deficiency anemia 03/22/2015  . Vitamin B12 deficiency 02/18/2015  . Misuse of medications for pain 02/18/2015  . Major depressive disorder, recurrent, severe  with psychotic features ([place]) 02/15/2015  . Helicobacter pylori infection 11/23/2014  . Hemiparesis, left ([place]) 11/23/2014  . Malignant melanoma ([place]) 08/25/2014  . Chronic systolic CHF (congestive heart failure) ([place])   . Incomplete bladder emptying 07/12/2014  . Hypothyroidism 12/30/2013  . Aberrant subclavian artery 11/17/2013  . Multiple sclerosis ([place]) 11/02/2013  . History of CVA with residual deficit 06/20/2013  . Headache, migraine 05/29/2013  . Hyperlipidemia   . GERD (gastroesophageal reflux disease)   . Neuropathy ([place]) 01/02/2011  . Stroke ([place]) 06/21/2008  . Depression with anxiety 05/01/2008  . Essential hypertension 05/01/2008   PCP:  [name], MD Pharmacy:   [facility] [address] ([address]), [place] [address] [place] ([place]) [place] [address] Phone: [phone] Fax: [place]), [place] [address] [place] ([place]) [place] [address] Phone: [phone] Fax: [phone]     Social Determinants of Health (SDOH) Interventions    Readmission Risk Interventions Readmission Risk Prevention Plan 11/07/2019 10/18/2019 09/05/2019  Transportation Screening Complete Complete Complete  Medication Review (RN Care Manager) Complete Complete Complete  PCP or Specialist appointment within 3-5 days of discharge (No Data) Complete Complete  HRI or Home Care Consult Complete Complete -  SW Recovery Care/Counseling Consult - Complete Complete  SW Consult Not Complete Comments - - -  Palliative Care Screening Not Applicable Not Applicable Not [place] Not Applicable Not Applicable Not Applicable  Some recent data might be hidden

## 2019-11-07 NOTE — Progress Notes (Signed)
Progress Note  Patient Name: [name] Date of Encounter: 11/07/2019  Primary Cardiologist: [name]  Subjective   She has noted a few episodes of mild chest pain overnight, improved from prior. VQ scan most consistent with right upper lobe PE. She will be transitioned from heparin gtt to Eliquis per IM. Renal function improved.   Inpatient Medications    Scheduled Meds: . apixaban  10 mg Oral BID   Followed by  . [START ON 11/14/2019] apixaban  5 mg Oral BID  . buPROPion  300 mg Oral Daily  . carvedilol  12.5 mg Oral BID WC  . clopidogrel  75 mg Oral Daily  . gabapentin  300 mg Oral BID  . insulin aspart  0-5 Units Subcutaneous QHS  . insulin aspart  0-9 Units Subcutaneous TID WC  . isosorbide mononitrate  120 mg Oral Daily  . levothyroxine  25 mcg Oral QAC breakfast  . pantoprazole  40 mg Oral BID  . rosuvastatin  40 mg Oral q1800  . venlafaxine XR  150 mg Oral Q breakfast   Continuous Infusions:  PRN Meds: acetaminophen, ALPRAZolam, morphine injection, nitroGLYCERIN, ondansetron, traZODone, Ubrogepant   Vital Signs    Vitals:   11/06/19 2051 11/07/19 0318 11/07/19 0334 11/07/19 0738  BP:  (!) 156/90  (!) 188/86  Pulse:  (!) 58  66  Resp: 18 17  16   Temp:  (!) 97.5 F (36.4 C)  98.2 F (36.8 C)  TempSrc:  Oral  Oral  SpO2:  100%  100%  Weight:  69.3 kg 69.1 kg   Height:        Intake/Output Summary (Last 24 hours) at 11/07/2019 0904 Last data filed at 11/07/2019 0300 Gross per 24 hour  Intake 2780.32 ml  Output --  Net 2780.32 ml   Filed Weights   11/05/19 1242 11/07/19 0318 11/07/19 0334  Weight: 63.5 kg 69.3 kg 69.1 kg    Telemetry    SR with heart rates ranging from the 50s to 70s bpm - Personally Reviewed  ECG    No new tracings - Personally Reviewed  Physical Exam   GEN: No acute distress.   Neck: No JVD. Cardiac: RRR, no murmurs, rubs, or gallops.  Respiratory: Clear to auscultation bilaterally.  GI: Soft, nontender,  non-distended.   MS: No edema; No deformity. Neuro:  Alert and oriented x 3; Nonfocal.  Psych: Normal affect.  Labs    Chemistry Recent Labs  Lab 11/05/19 1247 11/06/19 0807 11/07/19 0500  NA 139 140 143  K 5.1 4.5 4.5  CL 108 108 113*  CO2 23 24 22   GLUCOSE 94 77 71  BUN 27* 25* 26*  CREATININE 2.08* 1.95* 1.48*  CALCIUM 8.9 8.8* 8.7*  GFRNONAA 27* 29* 41*  GFRAA 31* 34* 47*  ANIONGAP 8 8 8      Hematology Recent Labs  Lab 11/05/19 1247 11/06/19 0519 11/07/19 0500  WBC 7.3 6.4 7.1  RBC 4.11 3.71* 4.05  HGB 11.5* 10.0* 11.1*  HCT 34.3* 31.8* 33.7*  MCV 83.5 85.7 83.2  MCH 28.0 27.0 27.4  MCHC 33.5 31.4 32.9  RDW 13.9 13.8 13.8  PLT 227 192 177    Cardiac EnzymesNo results for input(s): TROPONINI in the last 168 hours. No results for input(s): TROPIPOC in the last 168 hours.   BNP Recent Labs  Lab 11/05/19 1247  BNP 24.9     DDimer No results for input(s): DDIMER in the last 168 hours.  Radiology    CT HEAD WO CONTRAST  Result Date: 11/05/2019 IMPRESSION: 1. No acute intracranial findings. 2. Chronic small vessel ischemic change and remote infarcts. Electronically Signed   By: Audie Pinto M.D.   On: 11/05/2019 16:31   MR BRAIN WO CONTRAST  Result Date: 11/05/2019 IMPRESSION: 1. No acute intracranial abnormality. 2. Diffuse confluent hyperintense T2-weighted signal within the periventricular, deep and juxtacortical white matter, consistent with chronic ischemic microangiopathy. Electronically Signed   By: Ulyses Jarred M.D.   On: 11/05/2019 21:15   NM Pulmonary Perfusion  Result Date: 11/06/2019 IMPRESSION: Findings most consistent with RIGHT upper lobe pulmonary embolism. Electronically Signed   By: Suzy Bouchard M.D.   On: 11/06/2019 14:23   US Venous Img Lower Bilateral (DVT)  Result Date: 11/05/2019 IMPRESSION: Negative. Electronically Signed   By: Virgina Norfolk M.D.   On: 11/05/2019 18:15   DG Chest Portable 1 View  Result  Date: 11/05/2019 IMPRESSION: No acute cardiopulmonary abnormality. 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 Electronically Signed   By: Genevie Ann M.D.   On: 11/05/2019 13:14   Cardiac Studies   LHC 04/20/2019: Coronary dominance: Right  Left mainstem: Large vessel that bifurcates into the LAD and left circumflex, no significant disease noted  Left anterior descending (LAD): Large vessel that extends to the apical region, diagonal branch #1 of moderate size with patent stent , diagonal #2 small to moderate in size with severe ostial/proximal disease, severe distal disease estimated 80%  Left circumflex (LCx): Large vessel with OM branch 2, mild to moderate mid vessel disease  Right coronary artery (RCA): Right dominant vessel with PL and PDA, no significant disease noted , very mild mid vessel disease  Left ventriculography: Left ventricular systolic function is normal, LVEF is estimated at 55-65%, there is no significant mitral regurgitation , no significant aortic valve stenosis  Final Conclusions:  In comparison to images from catheterization 2019 no significant change Severe native coronary disease, Patent stents LAD and diagonal vessel Stable severe ostial/proximal diagonal #2 disease, no change from previous catheterization 2019, not amenable to stenting Distal LAD is small and tapers, severe disease, not amenable to stenting  Recommendations:  Continue aggressive medical management, LDL less than 70, aggressive diabetes control, antianginals Of note left ventricular end-diastolic pressure was low consistent with dehydration, would continue IV fluids __________  2D echo 01/2019: 1. Left ventricular ejection fraction, by visual estimation, is 60 to  65%. The left ventricle has normal function. There is no left ventricular  hypertrophy.  2. Left ventricular diastolic parameters are consistent with Grade I  diastolic dysfunction (impaired relaxation).    3. The left ventricle has no regional wall motion abnormalities.  4. Global right ventricle has normal systolic function.The right  ventricular size is normal. No increase in right ventricular wall  thickness.  5. Left atrial size was normal.  6. TR signal is inadequate for assessing pulmonary artery systolic  pressure.  __________  2D echo 11/06/2019: 1. Left ventricular ejection fraction, by estimation, is 60 to 65%. The  left ventricle has normal function. The left ventricle has no regional  wall motion abnormalities. There is mild left ventricular hypertrophy.  Left ventricular diastolic parameters  are consistent with Grade I diastolic dysfunction (impaired relaxation).  2. Right ventricular systolic function was not well visualized. The right  ventricular size is mildly enlarged.  3. Left atrial size was mildly dilated.  4. Right atrial size was mildly dilated.  5. The  mitral valve is grossly normal. Trivial mitral valve  regurgitation.  6. The aortic valve was not well visualized. Aortic valve regurgitation  is not visualized.   Patient Profile     49 y.o. female with history of  CAD s/p priorPCI to the LAD and D1 with stable coronary anatomy by most recent cath in 04/2019,HFrEF secondary to ICM with prior EF of 45-50% in 2016 subsequent normalization of LVSF by echo in 10/2015,CKD stage III, prior CVAs, left carotid to subclavian artery bypass with subclavian artery ligation, aortic arch aneurysm, anemia, obesity status post gastric bypass,bipolar disorder, DMwith polyneuropathyand hypoglycemia,migraine disorder,HTN, HLD, and frequent falls/syncope/near syncopewho is being seen today for the evaluation of chest pain at the request of Dr. Blaine Hamper.  Assessment & Plan    1. CAD involving the native coronary arteries with chronic angina: -Chronic angina slightly improved overnight -High-sensitivity troponin negative x4 with EKG showing no acute ischemic  changes -Echo this admission demonstrated a preserved LVSF with no regional wall motion abnormalities -She most recently underwent LHC in 04/2019 which showed patent stents in the LAD and diagonal with stable severe ostial to proximal D2 disease that was unchanged from cath in 2019 and not amenable to stenting due to increased risk for worsening her LAD stenosis. Her distal LAD was small and tapered to severe disease which also was not amenable to stenting. Medical management was advised. And, in this setting, Imdur was titrated to 120 mg daily, and she was restarted on carvedilol and amlodipine -Will plan for LHC with Dr. Fletcher Anon at [facility] in several weeks for possible complex bifurcation stenting of the LAD/D1 -Given she will be discharged on DOAC, we will stop ASA to avoid triple therapy -She will continue Plavix with DOAC -ASA, Coreg, Crestor, Imdur -Previously refused Ranexa -Recently started back on low dose amlodipine, though she indicates this did not help  2. PE: -VQ scan with findings most consistent with right upper lobe PE -She remains on heparin gtt with transition to DOAC per IM  2.  Syncope: -Possibly in the setting of her PE -Cannot exclude hypoglycemia given her recent hospital admissions for similar issue and documented hypoglycemic episodes this admission -No significant events on telemetry  -Echo with no significant structural abnormalities as above -CT head/MRI brain not acute   3. HFrEF secondary to ICM: -LVSF has normalized by echo in 01/2019 which continues to be normal this admission -Not requiring a standing diuretic  -Continue Coreg as vitals allow  4.  Acute on CKD stage III: -Improved -Gentle hydration -Per IM  5.  Hypoglycemia: -Likely contributing to her overall presentation -Management per internal medicine  6.  HLD: -LDL of 48 this admission -Continue PTA Crestor  7.  Labile hypertension: -BP has ranged from the 16P systolic to the  537S systolic -With this, escalation of antianginal therapy is somewhat limited -Continue current medical therapy    From a cardiac perspective, she can be discharged with follow up in our office in 7-14 days. We will arrange this. She should continue Plavix, Imdur, Coreg, Crestor, and Eliquis in place of ASA.   For questions or updates, please contact [place] Please consult [email or web address] for contact info under Cardiology/STEMI.    Signed, Christell Faith, PA-C [place] Pager: [phone] 11/07/2019, 9:04 AM

## 2019-11-07 NOTE — Consult Note (Signed)
[place] for apixaban Indication: pulmonary embolus  Allergies  Allergen Reactions  . Lipitor [Atorvastatin] Other (See Comments)    Leg pains  . Tramadol Other (See Comments)    Mouth feels like it's on fire    Patient Measurements: Height: _0  (157.5 cm) Weight: 69.1 kg (152 lb 6.4 oz) IBW/kg (Calculated) : 50.1   Vital Signs: Temp: 98.2 F (36.8 C) (10/05 0738) Temp Source: Oral (10/05 0738) BP: 188/86 (10/05 0738) Pulse Rate: 66 (10/05 0738)  Labs: Recent Labs     0000 11/05/19 1247 11/05/19 1247 11/05/19 1457 11/05/19 1630 11/05/19 2204 11/05/19 2204 11/06/19 0519 11/06/19 0519 11/06/19 0807 11/06/19 1251 11/06/19 2112 11/07/19 0500 11/07/19 0651  HGB   < > 11.5*  --   --   --   --   --  10.0*  --   --   --   --  11.1*  --   HCT  --  34.3*  --   --   --   --   --  31.8*  --   --   --   --  33.7*  --   PLT  --  227  --   --   --   --   --  192  --   --   --   --  177  --   APTT  --   --   --   --  39*  --   --   --   --   --   --   --   --   --   LABPROT  --   --   --   --  13.3  --   --   --   --   --   --   --   --   --   INR  --   --   --   --  1.1  --   --   --   --   --   --   --   --   --   HEPARINUNFRC  --   --   --   --   --  0.38   < > 0.30   < >  --  0.16* 0.27*  --  0.67  CREATININE  --  2.08*  --   --   --   --   --   --   --  1.95*  --   --  1.48*  --   TROPONINIHS  --  7   < > _1 --   --   --   --   --   --   --   --    < > = values in this interval not displayed.    Estimated Creatinine Clearance: 41 mL/min (A) (by C-G formula based on SCr of 1.48 mg/dL (H)).   Medical History: Past Medical History:  Diagnosis Date  . Acute colitis 01/27/2017  . Acute pyelonephritis   . Anemia    iron deficiency anemia  . Aortic arch aneurysm ([place])   . BRCA negative 2014  . CAD (coronary artery disease)    a. 08/2003 Cath: LAD 30-40-med Rx; b. 11/2014 PCI: LAD 70m(3.25x23 Xience Alpine DES); c. 06/2015  PCI: D1 ((1.61W96Resolute Integrity DES); d. 06/2017 PCI: Patent mLAD stent, D2 95 (PTCA); e. 09/2017 PCI: D2 99ost (CBA); d. 12/2017 Cath: LM nl,  LAD 44m 80d (small), D1 40ost, D2 95ost, LCX 40p, RCA 40ost/p->Med rx for D2 given restenosis.  . Colitis 06/03/2015  . Colon polyp   . CVA (cerebral vascular accident) ([place]    Left side weakness.   . Degenerative tear of glenoid labrum of right shoulder 03/15/2017  . Diabetes mellitus without complication ([place]   . Family history of breast cancer    BRCA neg 2014  . Femur fracture, left ([place] 10/12/2018  . Gastric ulcer 04/27/2011  . History of echocardiogram    a. 03/2017 Echo: EF 60-65%, no rwma; b. 02/2018 Echo: EF 60-65%, no rwma. Nl RV fxn. No cardiac source of emboli (admitted w/ stroke).  . Hypertension   . Malignant melanoma of skin of scalp ([place]   . MI, acute, non ST segment elevation ([place]   . Neuromuscular disorder (HR[place]   . S/P drug eluting coronary stent placement 06/04/2015  . Sepsis ([place] 03/18/2017  . Stroke ([facility]    a. 02/2018 MRI: 760mlate acute/early subacute L medial frontal lobe inarct; b. 02/2018 MRA No large vessel occlusion or aneurysm. Mod to sev L P2 stenosis. thready L vertebral artery, diffusely dzs'd; c. 02/2018 Carotid U/S: <50% bilat ICA dzs.    Medications:  Medications Prior to Admission  Medication Sig Dispense Refill Last Dose  . acarbose (PRECOSE) 25 MG tablet Take 25 mg by mouth 3 (three) times daily before meals.    11/05/2019 at 0800  . ALPRAZolam (XANAX) 1 MG tablet Take 1 tablet by mouth twice daily as needed (Patient taking differently: Take 1 mg by mouth 2 (two) times daily as needed for anxiety. ) 60 tablet 3 Unknown at PRN  . amLODipine (NORVASC) 2.5 MG tablet Take 1 tablet (2.5 mg total) by mouth daily. (Patient taking differently: Take 2.5 mg by mouth every evening. ) 90 tablet 3 11/04/2019 at 2000  . aspirin 81 MG EC tablet Take 81 mg by mouth daily.    11/05/2019 at 0800  . buPROPion (WELLBUTRIN XL) 300 MG  24 hr tablet Take 1 tablet by mouth once daily (Patient taking differently: Take 300 mg by mouth daily. ) 90 tablet 1 11/05/2019 at 0800  . carvedilol (COREG) 12.5 MG tablet Take 1 tablet (12.5 mg total) by mouth 2 (two) times daily with a meal. 60 tablet 3 11/05/2019 at 0800  . clopidogrel (PLAVIX) 75 MG tablet Take 1 tablet (75 mg total) by mouth daily. 30 tablet 0 11/05/2019 at 0800  . EUTHYROX 25 MCG tablet TAKE 1 TABLET BY MOUTH ONCE DAILY BEFORE BREAKFAST (Patient taking differently: Take 25 mcg by mouth daily before breakfast. ) 30 tablet 0 11/05/2019 at 0700  . gabapentin (NEURONTIN) 300 MG capsule Take 1 capsule (300 mg total) by mouth 2 (two) times daily. 180 capsule 4 11/05/2019 at 0800  . isosorbide mononitrate (IMDUR) 120 MG 24 hr tablet Take 1 tablet (120 mg total) by mouth daily. 30 tablet 3 11/05/2019 at 0800  . nitroGLYCERIN (NITROSTAT) 0.4 MG SL tablet Place 1 tablet (0.4 mg total) under the tongue every 5 (five) minutes as needed for chest pain. 25 tablet 2 Unknown at PRN  . pantoprazole (PROTONIX) 40 MG tablet Take 1 tablet by mouth twice daily (Patient taking differently: Take 40 mg by mouth 2 (two) times daily. ) 180 tablet 1 11/05/2019 at 0800  . promethazine (PHENERGAN) 25 MG tablet Take 25 mg by mouth every 6 (six) hours as needed for nausea or vomiting.    Unknown at PRN  .  propranolol ER (INDERAL LA) 60 MG 24 hr capsule Take 60 mg by mouth at bedtime.   11/04/2019 at 2000  . rosuvastatin (CRESTOR) 40 MG tablet Take 1 tablet (40 mg total) by mouth daily at 6 PM. 90 tablet 3 11/04/2019 at 1800  . traZODone (DESYREL) 100 MG tablet Take 100-200 mg by mouth at bedtime as needed for sleep.    Unknown at PRN  . UBRELVY 100 MG TABS Take 1 tablet by mouth 2 (two) times daily as needed (migraine).    Unknown at PRN  . venlafaxine XR (EFFEXOR-XR) 150 MG 24 hr capsule Take 1 capsule (150 mg total) by mouth daily with breakfast. 30 capsule 5 11/05/2019 at 0800   Scheduled:  . buPROPion  300 mg  Oral Daily  . carvedilol  12.5 mg Oral BID WC  . clopidogrel  75 mg Oral Daily  . gabapentin  300 mg Oral BID  . insulin aspart  0-5 Units Subcutaneous QHS  . insulin aspart  0-9 Units Subcutaneous TID WC  . isosorbide mononitrate  120 mg Oral Daily  . levothyroxine  25 mcg Oral QAC breakfast  . pantoprazole  40 mg Oral BID  . rosuvastatin  40 mg Oral q1800  . venlafaxine XR  150 mg Oral Q breakfast   Infusions:  . heparin 1,400 Units/hr (11/07/19 0300)   PRN: acetaminophen, ALPRAZolam, morphine injection, nitroGLYCERIN, ondansetron, traZODone, Ubrogepant Anti-infectives (From admission, onward)   None      Assessment: Pharmacy consulted to start apixaban for PE. CBC stable.   Goal of Therapy:  Monitor platelets by anticoagulation protocol: Yes   Plan:  Apixaban 10 mg BID x 7 days followed by 5 mg BID. Will d/c heparin.   Yun Gutierrez S Arelis Neumeier 11/07/2019,8:54 AM

## 2019-11-07 NOTE — Progress Notes (Signed)
Hypoglycemic Event  CBG: 48  Treatment: 8 oz reg soda & dinner   Symptoms: symptoms from previous low CBG better  Follow-up CBG: Time:1729 CBG Result:140  Possible Reasons for Event: unknown- pt sates this happens to her  Comments/MD notified:MD aware-spoke with patient. Both agree to discharge.     Feliberto Gottron

## 2019-11-07 NOTE — Plan of Care (Signed)

## 2019-11-07 NOTE — Progress Notes (Signed)
Patient given discharge instructions with eliquis coupon. Pharmacy educated on eliquis as well. Patient had incident of hypoglycemia this evening and appropriate interventions were taken. Dr. Lonny Prude was made aware and spoke with patient. Both agreed that it was still ok for patient go be discharged. Patient verbalized agreement and IV was taken out and tele monitor taken off. Patient being discharged home in stable condition with last blood sugar of 140.

## 2019-11-07 NOTE — Progress Notes (Signed)
Hypoglycemic Event  CBG: 55  Treatment: 4 oz reg soda  Symptoms: diaphoretic and hands were shaky  Follow-up CBG: Time: 1655 CBG Result:48  Possible Reasons for Event: unknowon. Patient had a good lunch with 2 units of insulin to cover CBG of 155.  Comments/MD notified: Dr. Lonny Prude paged to notify    [name]

## 2019-11-07 NOTE — Discharge Summary (Signed)
Physician Discharge Summary  [name]:[id number] DOB: 07-Aug-1970 DOA: 11/05/2019  PCP: [name], MD  Admit date: 11/05/2019 Discharge date: 11/07/2019  Admitted From: Home Disposition: Home  Recommendations for Outpatient Follow-up:  1. Follow up with PCP in 1 week 2. Please obtain BMP/CBC in one week 3. Please follow up on the following pending results: None  Home Health: PT Equipment/Devices: None  Discharge Condition: Stable CODE STATUS: Full code Diet recommendation: Heart healthy   Brief/Interim Summary:  Admission HPI written by Ivor Costa, MD   Chief Complaint: chest pain and syncope  HPI: [name] [name] is a 49 y.o. female with medical history significant of hypertension, hyperlipidemia, diabetes mellitus, stroke, GERD, hypothyroidism, depression, anxiety, CAD, s/p of DES placement, gastric ulcer disease, colitis, anemia, aortic arch aneurysm, dCHF, PSVT, CKD-3, s/p of gastric bypass, s/p of bypass to left carotid to subclavian artery, who presents with chest pain and syncope.  Patient states that her chest pain started when she was in church.  It is located in the substernal area, initially 10 out of 10 severity, currently 3 out of 10 severity, pressure-like, radiating to left jaw and arm.  Associated with diaphoresis.  Patient denies shortness breath, cough, fever or chills. Per report, patient had oxygen desaturation to upper 80s and tachycardia with HR up to 200s per EDP.  Patient has nausea, no vomiting, diarrhea or abdominal pain.  No symptoms of UTI.  Per report, pt had loss of consciousness for about 3 minutes. Patient returned to normal mental status spontaneously.  Patient has chronic minimal left-sided weakness from previous stroke, which has not changed.  No unilateral numbness or tingling to extremities, no facial droop or slurred speech.    Hospital course:  Chest pain Symptoms are typical in setting of stable angina. Not  managed on home Imdur. Improvement after administration of nitroglycerin x3. She has recurrent chest pressure with ambulation while in the hospital. Cardiology consulted on 10/3 and recommendations for no inpatient workup and no evidence of ACS. Transthoracic Echocardiogram without acute insult in relation to chest pain. Elevated D-dimer on admission with V/Q scan significant for RUL PE.  Right upper lobe PE Seen on V/Q scan. Was on heparin for possible ACS and was converted to Eliquis. Ambulated on room air without desaturation. Continue Eliquis. Possibly provoked in setting of recent hospitalization.  Syncope Patient states at the time of the episode, she had a significantly elevated pulse >200 bpm on pulse oximetry. She also reports being diaphoretic. Possible patient suffered significant arrhythmia. EKG on admission unremarkable for arrhythmia. EF is normal on Transthoracic Echocardiogram this admission without reduced EF of wall motion abnormality. Telemetry without arrhythmia. Cardiology recommending no further inpatient workup.  Diabetes mellitus, type 2 Patient is on Acarbose 25 mg TID as an outpatient. Last hemoglobin A1C of 5.2%. Hypoglycemic episode while inpatient. Discontinue Acarbose on discharge.  CAD History of cardiac catheterization s/p PCI. Currently managed on aspirin, Crestor, Plavix. Continue Plavix, Crestor. Discontinued aspirin in setting of Eliquis per cardiology recommendations  Essential hypertension Patient is on Imdur 120 mg daily, amlodipine 2.5 mg daily, propranolol ER 60 mg daily, Coreg 12.5 mg BID. On chart review, it appears patient's propranolol was discontinued. Antihypertensives held on admission secondary to soft blood pressures except propranolol. Discontinue propranolol on discharge. Continue Imdur, amlodipine, Coreg  Hypothyroidism Patient is on Euthyrox 25 mcg daily as an outpatient. Started on Synthroid while inpatient. Continue Euthyrox  Chronic  combined systolic and diastolic heart  failure EF improved and is now normal. Grade 1 diastolic dysfunction. On Coreg as an outpatient. Euvolemic -Continue Coreg  AKI on CKD stage IIIa Baseline creatinine of 1.5. Creatinine of 2.08 on admission. Given IV fluids with resolution of AKI.   Anxiety Bipolar disorder Continue Xanax prn, Effexor-XR, Wellbutrin XL  History of CVA On aspirin, Plavix and Crestor. As mentioned above  Neuropathy In setting of diabetes. Continue Gabapentin (renally dosed)  Aortic arch aneurysm Migraines S/p bypass left carotid artery to subclavian artery History of obesity s/p gastric bypass Body mass index is 27.87 kg/m.  Discharge Diagnoses:  Principal Problem:   Pulmonary embolism (HCC) Active Problems:   Essential hypertension   Stroke (HCC)   Neuropathy (HCC)   Hyperlipidemia   GERD (gastroesophageal reflux disease)   Hypothyroidism   CAD (coronary artery disease)   Anxiety   Syncope   Acute renal failure superimposed on stage 3a chronic kidney disease (HCC)   Chest pain   Type II diabetes mellitus with renal manifestations [facility])    Discharge Instructions  Discharge Instructions    Call MD for:  difficulty breathing, headache or visual disturbances   Complete by: As directed    Call MD for:  severe uncontrolled pain   Complete by: As directed    Call MD for:  temperature >100.4   Complete by: As directed    Increase activity slowly   Complete by: As directed      Allergies as of 11/07/2019      Reactions   Lipitor [atorvastatin] Other (See Comments)   Leg pains   Tramadol Other (See Comments)   Mouth feels like it's on fire      Medication List    STOP taking these medications   acarbose 25 MG tablet Commonly known as: PRECOSE   aspirin 81 MG EC tablet   propranolol ER 60 MG 24 hr capsule Commonly known as: INDERAL LA     TAKE these medications   ALPRAZolam 1 MG tablet Commonly known as: XANAX Take 1 tablet by  mouth twice daily as needed What changed: reasons to take this   amLODipine 2.5 MG tablet Commonly known as: NORVASC Take 1 tablet (2.5 mg total) by mouth daily. What changed: when to take this   Apixaban Starter Pack (10mg  and 5mg ) Commonly known as: ELIQUIS STARTER PACK Take as directed on package: start with two-5mg  tablets twice daily for 7 days. On day 8, switch to one-5mg  tablet twice daily.   buPROPion 300 MG 24 hr tablet Commonly known as: WELLBUTRIN XL Take 1 tablet by mouth once daily   carvedilol 12.5 MG tablet Commonly known as: COREG Take 1 tablet (12.5 mg total) by mouth 2 (two) times daily with a meal.   clopidogrel 75 MG tablet Commonly known as: PLAVIX Take 1 tablet (75 mg total) by mouth daily.   Euthyrox 25 MCG tablet Generic drug: levothyroxine TAKE 1 TABLET BY MOUTH ONCE DAILY BEFORE BREAKFAST What changed: See the new instructions.   gabapentin 300 MG capsule Commonly known as: NEURONTIN Take 1 capsule (300 mg total) by mouth 2 (two) times daily.   isosorbide mononitrate 120 MG 24 hr tablet Commonly known as: IMDUR Take 1 tablet (120 mg total) by mouth daily.   nitroGLYCERIN 0.4 MG SL tablet Commonly known as: NITROSTAT Place 1 tablet (0.4 mg total) under the tongue every 5 (five) minutes as needed for chest pain.   pantoprazole 40 MG tablet Commonly known as: PROTONIX Take 1 tablet by  mouth twice daily   promethazine 25 MG tablet Commonly known as: PHENERGAN Take 25 mg by mouth every 6 (six) hours as needed for nausea or vomiting.   rosuvastatin 40 MG tablet Commonly known as: CRESTOR Take 1 tablet (40 mg total) by mouth daily at 6 PM.   traZODone 100 MG tablet Commonly known as: DESYREL Take 100-200 mg by mouth at bedtime as needed for sleep.   Ubrelvy 100 MG Tabs Generic drug: Ubrogepant Take 1 tablet by mouth 2 (two) times daily as needed (migraine).   venlafaxine XR 150 MG 24 hr capsule Commonly known as: EFFEXOR-XR Take 1  capsule (150 mg total) by mouth daily with breakfast.       Follow-up Information    [name], MD. Schedule an appointment as soon as possible for a visit in 1 week(s).   Specialty: Family Medicine Why: Hospital follow-up Acute PE Contact information: [address] [place] [address] [phone]              Allergies  Allergen Reactions  . Lipitor [Atorvastatin] Other (See Comments)    Leg pains  . Tramadol Other (See Comments)    Mouth feels like it's on fire    Consultations:  Cardiology   Procedures/Studies: DG Chest 1 View  Result Date: 10/18/2019 CLINICAL DATA:  Chest pain EXAM: CHEST  1 VIEW COMPARISON:  09/08/2019, CT 01/19/2019 FINDINGS: No focal opacity or pleural effusion. Stable cardiomediastinal silhouette with right-sided aortic arch. No pneumothorax. Clips in the upper mediastinum. IMPRESSION: No active disease. Right-sided aortic arch. Electronically Signed   By: Donavan Foil M.D.   On: 10/18/2019 02:52   CT HEAD WO CONTRAST  Result Date: 11/05/2019 CLINICAL DATA:  Chest pain.  Intermittent loss consciousness. EXAM: CT HEAD WITHOUT CONTRAST TECHNIQUE: Contiguous axial images were obtained from the base of the skull through the vertex without intravenous contrast. COMPARISON:  CT head 02/18/2019 FINDINGS: Brain: No evidence of acute infarction, hemorrhage, hydrocephalus, extra-axial collection or mass lesion/mass effect. Old remote lacunar infarcts in the basal ganglia as well as corona radiata bilaterally. Confluent periventricular white matter hypodensity consistent with chronic small vessel ischemic change. Vascular: No hyperdense vessel or unexpected calcification. Skull: Normal. Negative for fracture or focal lesion. Sinuses/Orbits: No acute finding. Other: None. IMPRESSION: 1. No acute intracranial findings. 2. Chronic small vessel ischemic change and remote infarcts. Electronically Signed   By: Audie Pinto M.D.   On:  11/05/2019 16:31   MR BRAIN WO CONTRAST  Result Date: 11/05/2019 CLINICAL DATA:  Syncope EXAM: MRI HEAD WITHOUT CONTRAST TECHNIQUE: Multiplanar, multiecho pulse sequences of the brain and surrounding structures were obtained without intravenous contrast. COMPARISON:  02/19/2019 FINDINGS: Brain: No acute infarct, acute hemorrhage or extra-axial collection. Diffuse confluent hyperintense T2-weighted signal within the periventricular, deep and juxtacortical white matter. Multiple remote small vessel infarcts. Normal volume of CSF spaces. Fewer than 5 scattered microhemorrhages in a nonspecific pattern. Normal midline structures. Vascular: Normal flow voids. Skull and upper cervical spine: Normal marrow signal. Sinuses/Orbits: Negative. Other: None. IMPRESSION: 1. No acute intracranial abnormality. 2. Diffuse confluent hyperintense T2-weighted signal within the periventricular, deep and juxtacortical white matter, consistent with chronic ischemic microangiopathy. Electronically Signed   By: Ulyses Jarred M.D.   On: 11/05/2019 21:15   NM Pulmonary Perfusion  Result Date: 11/06/2019 CLINICAL DATA:  PE suspected.  Elevated D-dimer.  Chest pain. EXAM: NUCLEAR MEDICINE PERFUSION LUNG SCAN TECHNIQUE: Perfusion images were obtained in multiple projections after intravenous injection of radiopharmaceutical.  RADIOPHARMACEUTICALS:  3.4 mCi Tc-68m MAA COMPARISON:  Chest radiograph 11/05/2019 FINDINGS: Wedge-shaped peripheral filling defect within superior segment of the RIGHT upper lobe seen on the RPO projection. RIGHT upper lobe defect also seen on the posterior projection. No corresponding finding on chest radiograph. IMPRESSION: Findings most consistent with RIGHT upper lobe pulmonary embolism. Electronically Signed   By: Suzy Bouchard M.D.   On: 11/06/2019 14:23   US Venous Img Lower Bilateral (DVT)  Result Date: 11/05/2019 CLINICAL DATA:  Elevated D-dimer. EXAM: BILATERAL LOWER EXTREMITY VENOUS DOPPLER  ULTRASOUND TECHNIQUE: Gray-scale sonography with compression, as well as color and duplex ultrasound, were performed to evaluate the deep venous system(s) from the level of the common femoral vein through the popliteal and proximal calf veins. COMPARISON:  None. FINDINGS: VENOUS Normal compressibility of the common femoral, superficial femoral, and popliteal veins, as well as the visualized calf veins. Visualized portions of profunda femoral vein and great saphenous vein unremarkable. No filling defects to suggest DVT on grayscale or color Doppler imaging. Doppler waveforms show normal direction of venous flow, normal respiratory plasticity and response to augmentation. Limited views of the contralateral common femoral vein are unremarkable. OTHER None. Limitations: none IMPRESSION: Negative. Electronically Signed   By: Virgina Norfolk M.D.   On: 11/05/2019 18:15   DG Chest Portable 1 View  Result Date: 11/05/2019 CLINICAL DATA:  49 year old female with chest pain diaphoresis and lethargy. EXAM: PORTABLE CHEST 1 VIEW COMPARISON:  Portable chest 10/18/2019 and earlier. FINDINGS: Portable AP upright view at 1255 hours. Lung volumes and mediastinal contours are within normal limits. Right side aortic arch demonstrated by CT last year. Visualized tracheal air column is within normal limits. Allowing for portable technique the lungs are clear. Left thoracic inlet surgical clips associated with chronic great vessel bypass graph. No acute osseous abnormality identified. Negative visible bowel gas pattern. IMPRESSION: No acute cardiopulmonary abnormality. 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 Electronically Signed   By: Genevie Ann M.D.   On: 11/05/2019 13:14   ECHOCARDIOGRAM COMPLETE  Result Date: 11/06/2019    ECHOCARDIOGRAM REPORT   Patient Name:   [name] [facility] Date of Exam: 11/06/2019 Medical Rec #:  [id number]             Height:       62.0 in Accession #:    [id number]             Weight:       140.0 lb Date of Birth:  [birth year]             BSA:          1.643 m Patient Age:    49 years              BP:           126/71 mmHg Patient Gender: F                     HR:           54 bpm. Exam Location:  [facility] Procedure: 2D Echo, Cardiac Doppler and Color Doppler Indications:     Chest pain 786.50  History:         Patient has prior history of Echocardiogram examinations, most                  recent 01/28/2019. Previous Myocardial Infarction; Risk  Factors:Hypertension and Diabetes. S/P coronary stents.  Sonographer:     Sherrie Sport RDCS (AE) Referring Phys:  [id number] Soledad Gerlach NIU Diagnosing Phys: Bartholome Bill MD IMPRESSIONS  1. Left ventricular ejection fraction, by estimation, is 60 to 65%. The left ventricle has normal function. The left ventricle has no regional wall motion abnormalities. There is mild left ventricular hypertrophy. Left ventricular diastolic parameters are consistent with Grade I diastolic dysfunction (impaired relaxation).  2. Right ventricular systolic function was not well visualized. The right ventricular size is mildly enlarged.  3. Left atrial size was mildly dilated.  4. Right atrial size was mildly dilated.  5. The mitral valve is grossly normal. Trivial mitral valve regurgitation.  6. The aortic valve was not well visualized. Aortic valve regurgitation is not visualized. FINDINGS  Left Ventricle: Left ventricular ejection fraction, by estimation, is 60 to 65%. The left ventricle has normal function. The left ventricle has no regional wall motion abnormalities. The left ventricular internal cavity size was normal in size. There is  mild left ventricular hypertrophy. Left ventricular diastolic parameters are consistent with Grade I diastolic dysfunction (impaired relaxation). Right Ventricle: The right ventricular size is mildly enlarged. Right vetricular wall thickness was not well visualized. Right ventricular systolic function was not well visualized. Left  Atrium: Left atrial size was mildly dilated. Right Atrium: Right atrial size was mildly dilated. Pericardium: There is no evidence of pericardial effusion. Mitral Valve: The mitral valve is grossly normal. Trivial mitral valve regurgitation. Tricuspid Valve: The tricuspid valve is not well visualized. Tricuspid valve regurgitation is trivial. Aortic Valve: The aortic valve was not well visualized. Aortic valve regurgitation is not visualized. Aortic valve mean gradient measures 5.0 mmHg. Aortic valve peak gradient measures 8.1 mmHg. Aortic valve area, by VTI measures 1.35 cm. Pulmonic Valve: The pulmonic valve was not well visualized. Pulmonic valve regurgitation is trivial. Aorta: The aortic root is normal in size and structure. IAS/Shunts: The interatrial septum was not assessed.  LEFT VENTRICLE PLAX 2D LVIDd:         4.86 cm  Diastology LVIDs:         3.39 cm  LV e' medial:    8.49 cm/s LV PW:         1.10 cm  LV E/e' medial:  11.5 LV IVS:        1.01 cm  LV e' lateral:   13.90 cm/s LVOT diam:     2.10 cm  LV E/e' lateral: 7.0 LV SV:         48 LV SV Index:   30 LVOT Area:     3.46 cm  RIGHT VENTRICLE RV Basal diam:  2.44 cm RV S prime:     9.79 cm/s TAPSE (M-mode): 3.9 cm LEFT ATRIUM             Index       RIGHT ATRIUM           Index LA diam:        4.20 cm 2.56 cm/m  RA Area:     19.50 cm LA Vol (A2C):   53.3 ml 32.44 ml/m RA Volume:   54.60 ml  33.24 ml/m LA Vol (A4C):   62.6 ml 38.11 ml/m LA Biplane Vol: 62.8 ml 38.23 ml/m  AORTIC VALVE                    PULMONIC VALVE AV Area (Vmax):    1.39 cm     PV Vmax:  0.70 m/s AV Area (Vmean):   1.16 cm     PV Peak grad:   1.9 mmHg AV Area (VTI):     1.35 cm     RVOT Peak grad: 3 mmHg AV Vmax:           142.00 cm/s AV Vmean:          102.000 cm/s AV VTI:            0.360 m AV Peak Grad:      8.1 mmHg AV Mean Grad:      5.0 mmHg LVOT Vmax:         56.90 cm/s LVOT Vmean:        34.100 cm/s LVOT VTI:          0.140 m LVOT/AV VTI ratio: 0.39  AORTA Ao  Root diam: 3.80 cm MITRAL VALVE               TRICUSPID VALVE MV Area (PHT): 2.71 cm    TR Peak grad:   12.4 mmHg MV Decel Time: 280 msec    TR Vmax:        176.00 cm/s MV E velocity: 97.50 cm/s MV A velocity: 70.80 cm/s  SHUNTS MV E/A ratio:  1.38        Systemic VTI:  0.14 m                            Systemic Diam: 2.10 cm Bartholome Bill MD Electronically signed by Bartholome Bill MD Signature Date/Time: 11/06/2019/12:18:32 PM    Final       Subjective: Chest pain is back to baseline.  Discharge Exam: Vitals:   11/07/19 1153 11/07/19 1527  BP: 114/84 112/90  Pulse: 64 64  Resp: 16 16  Temp: (!) 97.5 F (36.4 C) 98.2 F (36.8 C)  SpO2: 100% 92%   Vitals:   11/07/19 0738 11/07/19 1017 11/07/19 1153 11/07/19 1527  BP: (!) 188/86  114/84 112/90  Pulse: 66 70 64 64  Resp: 16  16 16   Temp: 98.2 F (36.8 C)  (!) 97.5 F (36.4 C) 98.2 F (36.8 C)  TempSrc: Oral  Oral Oral  SpO2: 100%  100% 92%  Weight:      Height:        General: Pt is alert, awake, not in acute distress Cardiovascular: RRR, S1/S2 +, no rubs, no gallops Respiratory: CTA bilaterally, no wheezing, no rhonchi Abdominal: Soft, NT, ND, bowel sounds + Extremities: no edema, no cyanosis    The results of significant diagnostics from this hospitalization (including imaging, microbiology, ancillary and laboratory) are listed below for reference.     Microbiology: Recent Results (from the past 240 hour(s))  Respiratory Panel by RT PCR (Flu A&B, Covid) - Nasopharyngeal Swab     Status: None   Collection Time: 11/05/19  2:34 PM   Specimen: Nasopharyngeal Swab  Result Value Ref Range Status   SARS Coronavirus 2 by RT PCR NEGATIVE NEGATIVE Final    Comment: (NOTE) SARS-CoV-2 target nucleic acids are NOT DETECTED.  The SARS-CoV-2 RNA is generally detectable in upper respiratoy specimens during the acute phase of infection. The lowest concentration of SARS-CoV-2 viral copies this assay can detect is 131 copies/mL. A  negative result does not preclude SARS-Cov-2 infection and should not be used as the sole basis for treatment or other patient management decisions. A negative result may occur with  improper specimen collection/handling, submission of  specimen other than nasopharyngeal swab, presence of viral mutation(s) within the areas targeted by this assay, and inadequate number of viral copies (<131 copies/mL). A negative result must be combined with clinical observations, patient history, and epidemiological information. The expected result is Negative.  Fact Sheet for Patients:  PinkCheek.be  Fact Sheet for Healthcare Providers:  GravelBags.it  This test is no t yet approved or cleared by the [place] FDA and  has been authorized for detection and/or diagnosis of SARS-CoV-2 by FDA under an Emergency Use Authorization (EUA). This EUA will remain  in effect (meaning this test can be used) for the duration of the COVID-19 declaration under Section 564(b)(1) of the Act, 21 U.S.C. section 360bbb-3(b)(1), unless the authorization is terminated or revoked sooner.     Influenza A by PCR NEGATIVE NEGATIVE Final   Influenza B by PCR NEGATIVE NEGATIVE Final    Comment: (NOTE) The Xpert Xpress SARS-CoV-2/FLU/RSV assay is intended as an aid in  the diagnosis of influenza from Nasopharyngeal swab specimens and  should not be used as a sole basis for treatment. Nasal washings and  aspirates are unacceptable for Xpert Xpress SARS-CoV-2/FLU/RSV  testing.  Fact Sheet for Patients: PinkCheek.be  Fact Sheet for Healthcare Providers: GravelBags.it  This test is not yet approved or cleared by the [place] FDA and  has been authorized for detection and/or diagnosis of SARS-CoV-2 by  FDA under an Emergency Use Authorization (EUA). This EUA will remain  in effect (meaning this test can  be used) for the duration of the  Covid-19 declaration under Section 564(b)(1) of the Act, 21  U.S.C. section 360bbb-3(b)(1), unless the authorization is  terminated or revoked. Performed at [facility], [address] [place], [place] [address]      Labs: BNP (last 3 results) Recent Labs    04/18/19 0910 10/17/19 2040 11/05/19 1247  BNP 114.0* 130.3* 42.5   Basic Metabolic Panel: Recent Labs  Lab 11/05/19 1247 11/06/19 0807 11/07/19 0500  NA 139 140 143  K 5.1 4.5 4.5  CL 108 108 113*  CO2 23 24 22   GLUCOSE 94 77 71  BUN 27* 25* 26*  CREATININE 2.08* 1.95* 1.48*  CALCIUM 8.9 8.8* 8.7*   Liver Function Tests: No results for input(s): AST, ALT, ALKPHOS, BILITOT, PROT, ALBUMIN in the last 168 hours. No results for input(s): LIPASE, AMYLASE in the last 168 hours. No results for input(s): AMMONIA in the last 168 hours. CBC: Recent Labs  Lab 11/05/19 1247 11/06/19 0519 11/07/19 0500  WBC 7.3 6.4 7.1  HGB 11.5* 10.0* 11.1*  HCT 34.3* 31.8* 33.7*  MCV 83.5 85.7 83.2  PLT 227 192 177   Cardiac Enzymes: No results for input(s): CKTOTAL, CKMB, CKMBINDEX, TROPONINI in the last 168 hours. BNP: Invalid input(s): POCBNP CBG: Recent Labs  Lab 11/06/19 1643 11/06/19 2019 11/07/19 0742 11/07/19 0821 11/07/19 1155  GLUCAP 198* 80 54* 92 155*   D-Dimer No results for input(s): DDIMER in the last 72 hours. Hgb A1c Recent Labs    11/06/19 0519  HGBA1C 5.2   Lipid Profile Recent Labs    11/06/19 0519  CHOL 94  HDL 29*  LDLCALC 48  TRIG 84  CHOLHDL 3.2   Thyroid function studies No results for input(s): TSH, T4TOTAL, T3FREE, THYROIDAB in the last 72 hours.  Invalid input(s): FREET3 Anemia work up No results for input(s): VITAMINB12, FOLATE, FERRITIN, TIBC, IRON, RETICCTPCT in the last 72 hours. Urinalysis    Component Value Date/Time  COLORURINE YELLOW (A) 10/18/2019 0826   APPEARANCEUR HAZY (A) 10/18/2019 0826   APPEARANCEUR Cloudy  (A) 07/17/2014 1455   LABSPEC 1.013 10/18/2019 0826   LABSPEC 1.012 12/29/2013 1210   PHURINE 5.0 10/18/2019 0826   GLUCOSEU NEGATIVE 10/18/2019 0826   GLUCOSEU >=500 12/29/2013 1210   HGBUR NEGATIVE 10/18/2019 0826   HGBUR negative 04/14/2010 1435   BILIRUBINUR NEGATIVE 10/18/2019 0826   BILIRUBINUR small 08/22/2019 1014   BILIRUBINUR Negative 07/17/2014 1455   BILIRUBINUR Negative 12/29/2013 1210   KETONESUR NEGATIVE 10/18/2019 0826   PROTEINUR NEGATIVE 10/18/2019 0826   UROBILINOGEN 0.2 08/22/2019 1014   UROBILINOGEN negative 04/14/2010 1435   NITRITE NEGATIVE 10/18/2019 0826   LEUKOCYTESUR LARGE (A) 10/18/2019 0826   LEUKOCYTESUR Negative 12/29/2013 1210   Sepsis Labs Invalid input(s): PROCALCITONIN,  WBC,  LACTICIDVEN Microbiology Recent Results (from the past 240 hour(s))  Respiratory Panel by RT PCR (Flu A&B, Covid) - Nasopharyngeal Swab     Status: None   Collection Time: 11/05/19  2:34 PM   Specimen: Nasopharyngeal Swab  Result Value Ref Range Status   SARS Coronavirus 2 by RT PCR NEGATIVE NEGATIVE Final    Comment: (NOTE) SARS-CoV-2 target nucleic acids are NOT DETECTED.  The SARS-CoV-2 RNA is generally detectable in upper respiratoy specimens during the acute phase of infection. The lowest concentration of SARS-CoV-2 viral copies this assay can detect is 131 copies/mL. A negative result does not preclude SARS-Cov-2 infection and should not be used as the sole basis for treatment or other patient management decisions. A negative result may occur with  improper specimen collection/handling, submission of specimen other than nasopharyngeal swab, presence of viral mutation(s) within the areas targeted by this assay, and inadequate number of viral copies (<131 copies/mL). A negative result must be combined with clinical observations, patient history, and epidemiological information. The expected result is Negative.  Fact Sheet for Patients:    PinkCheek.be  Fact Sheet for Healthcare Providers:  GravelBags.it  This test is no t yet approved or cleared by the [place] FDA and  has been authorized for detection and/or diagnosis of SARS-CoV-2 by FDA under an Emergency Use Authorization (EUA). This EUA will remain  in effect (meaning this test can be used) for the duration of the COVID-19 declaration under Section 564(b)(1) of the Act, 21 U.S.C. section 360bbb-3(b)(1), unless the authorization is terminated or revoked sooner.     Influenza A by PCR NEGATIVE NEGATIVE Final   Influenza B by PCR NEGATIVE NEGATIVE Final    Comment: (NOTE) The Xpert Xpress SARS-CoV-2/FLU/RSV assay is intended as an aid in  the diagnosis of influenza from Nasopharyngeal swab specimens and  should not be used as a sole basis for treatment. Nasal washings and  aspirates are unacceptable for Xpert Xpress SARS-CoV-2/FLU/RSV  testing.  Fact Sheet for Patients: PinkCheek.be  Fact Sheet for Healthcare Providers: GravelBags.it  This test is not yet approved or cleared by the [place] FDA and  has been authorized for detection and/or diagnosis of SARS-CoV-2 by  FDA under an Emergency Use Authorization (EUA). This EUA will remain  in effect (meaning this test can be used) for the duration of the  Covid-19 declaration under Section 564(b)(1) of the Act, 21  U.S.C. section 360bbb-3(b)(1), unless the authorization is  terminated or revoked. Performed at [facility], [address], [place], [place] [address]      Time coordinating discharge: 35 minutes  SIGNED:   Cordelia Poche, MD [facility] 11/07/2019, 3:43 PM

## 2019-11-07 NOTE — Discharge Instructions (Signed)
[place] were in the hospital because of chest pain and passing out. The cardiologist was consulted. You do not appear to have had a heart attack but we did find that you have a blood clot in the upper part of your right lung. This will be treated with a blood thinner called Eliquis and possibly occurred because of your recent hospitalization. With regard to you passing out, your workup was negative and I recommend you follow-up with your cardiologist.

## 2019-11-08 ENCOUNTER — Telehealth: Payer: Self-pay

## 2019-11-08 ENCOUNTER — Telehealth: Payer: Self-pay | Admitting: *Deleted

## 2019-11-08 DIAGNOSIS — I252 Old myocardial infarction: Secondary | ICD-10-CM | POA: Diagnosis not present

## 2019-11-08 DIAGNOSIS — E1142 Type 2 diabetes mellitus with diabetic polyneuropathy: Secondary | ICD-10-CM | POA: Diagnosis not present

## 2019-11-08 DIAGNOSIS — R55 Syncope and collapse: Secondary | ICD-10-CM | POA: Diagnosis not present

## 2019-11-08 DIAGNOSIS — E11649 Type 2 diabetes mellitus with hypoglycemia without coma: Secondary | ICD-10-CM | POA: Diagnosis not present

## 2019-11-08 DIAGNOSIS — I2699 Other pulmonary embolism without acute cor pulmonale: Secondary | ICD-10-CM | POA: Diagnosis not present

## 2019-11-08 DIAGNOSIS — I25118 Atherosclerotic heart disease of native coronary artery with other forms of angina pectoris: Secondary | ICD-10-CM | POA: Diagnosis not present

## 2019-11-08 NOTE — Telephone Encounter (Signed)
Can you move her hospital follow up to 3:40 on Monday the 11th. Slot is currently blocked, but you can unblock it for a hospital follow up. Thanks!

## 2019-11-08 NOTE — Telephone Encounter (Signed)
Yes, thank you so much!

## 2019-11-08 NOTE — Telephone Encounter (Signed)
Pt advised and agreed to apt change however I cannot override the block to move the apt. Helene Kelp can you change this please?

## 2019-11-08 NOTE — Chronic Care Management (AMB) (Signed)
°   Care Management   Outreach Note  11/08/2019 Name: [name] MRN: [id number] DOB: 1970/10/23  [name] is a 49 y.o. year old female who is a primary care patient of Fisher, Kirstie Peri, MD. I reached out to [facility] by phone today in response to a referral sent by [name] health plan.     An unsuccessful telephone outreach was attempted today. The patient was referred to the case management team for assistance with care management and care coordination.   Follow Up Plan: The care management team will reach out to the patient again over the next 7 days.  If patient returns call to provider office, please advise to call [place] at [phone].  [place] Management

## 2019-11-08 NOTE — Telephone Encounter (Signed)
HFU scheduled 11/22/19 @ 11:00 AM.

## 2019-11-08 NOTE — Telephone Encounter (Signed)
Transition Care Management Follow-up Telephone Call  Date of discharge and from where: [facility] on 11/07/19  How have you been since you were released from the hospital? Does not feel well, is still having SOB and having chest pains intermittenly. Pulse ox showed 95% this AM. Current pain level is an 8.5. Pt has being waiting too late to take a nitroglycerin tablet and does not want to take it unless absolutely needed. Pt states the nitro causes headaches and she's already on a strong dose of Imdur. Pt takes Aleve as needed for pain. Pt gets weak when she moves around. Declines fever, dizziness or n/v/d.  Any questions or concerns? Yes, pt is concerned about the blood clot and wants to know how will she know if its moving or if its gone?  Items Reviewed:  Did the pt receive and understand the discharge instructions provided? Yes   Medications obtained and verified? Yes   Any new allergies since your discharge? No   Dietary orders reviewed? Yes  Do you have support at home? Yes   Other (ie: DME, Home Health, etc): HH was ordered for PT.  Functional Questionnaire: (I = Independent and D = Dependent)  Bathing/Dressing- I   Meal Prep- I  Eating- I  Maintaining continence- I, uses depends at night.  Transferring/Ambulation- D, uses a cane and walkers.  Managing Meds- I   Follow up appointments reviewed:    PCP Hospital f/u appt confirmed? Yes  scheduled to see Dr Caryn Section on 11/22/19 @ 11:00 AM.  [facility] f/u appt confirmed? N/A   Are transportation arrangements needed? No   If their condition worsens, is the pt aware to call  their PCP or go to the ED? Yes  Was the patient provided with contact information for the PCP's office or ED? Yes  Was the pt encouraged to call back with questions or concerns? Yes

## 2019-11-08 NOTE — Evaluation (Addendum)
Physical Therapy Evaluation Patient Details Name: [name] MRN: [id number] DOB: 08-23-1970 Today's Date: 11/08/2019   History of Present Illness  presented to ER secondary to substernal chest pain, syncope; admitted for management of CAD, acute PE (heparin initiated 10/3 at 1654; cleared for participation with session by Dr. Caleb Popp)  Clinical Impression  Upon evaluation, patient alert and oriented; follows commands and demonstrates good effort with mobility tasks.  Bilat UE/LE strength and ROM grossly symmetrical and WFL for basic transfers and mobility; however, generally deconditioned throughout.  Limited balance reactions evident, tendency towards posterior LOB when standing without UE support.  Able to complete bed mobility with mod indep; sit/stand, basic transfers and gait (120') with Rw, cga/close sup.  Demonstrates reciprocal stepping pattern, decreased step height/length; very slow, guarded cadence.  Limited balance reactions evident; recommend continued use of RW at this time.  Vitals stable and WFL, sats >99% at rest and with exertion.  Chest pain maintained at 6/10 throughout gait trials, "normal for me" per patient report. RN/MD informed/aware. Would benefit from skilled PT to address above deficits and promote optimal return to PLOF.; Recommend transition to HHPT upon discharge from acute hospitalization.       Follow Up Recommendations Home health PT    Equipment Recommendations  Rolling walker with 5" wheels    Recommendations for Other Services       Precautions / Restrictions Precautions Precautions: Fall Restrictions Weight Bearing Restrictions: No      Mobility  Bed Mobility Overal bed mobility: Modified Independent                Transfers Overall transfer level: Needs assistance Equipment used: Rolling walker (2 wheeled) Transfers: Sit to/from Stand Sit to Stand: Min guard         General transfer comment: cuing for hand placement,  tends to pull on Rw  Ambulation/Gait Ambulation/Gait assistance: Min guard Gait Distance (Feet): 120 Feet Assistive device: Rolling walker (2 wheeled)       General Gait Details: reciprocal stepping pattern, decreased step height/length; very slow, guarded cadence.  Limited balance reactions evident; recommend continued use of RW at this time.  Vitals stable and WFL, sats >99% at rest and with exertion.  Chest pain maintained at 6/10 throughout gait trials, "normal for me" per patient report  Stairs            Wheelchair Mobility    Modified Rankin (Stroke Patients Only)       Balance Overall balance assessment: Needs assistance Sitting-balance support: No upper extremity supported;Feet supported Sitting balance-Leahy Scale: Good     Standing balance support: Bilateral upper extremity supported Standing balance-Leahy Scale: Fair                               Pertinent Vitals/Pain Pain Assessment: 0-10 Pain Score: 6  Pain Location: substernal chest pain, "normal for me" (pending cardiac cath in 3-4 weeks) Pain Descriptors / Indicators: Aching Pain Intervention(s): Limited activity within patient's tolerance;Monitored during session;Repositioned    Home Living Family/patient expects to be discharged to:: Private residence Living Arrangements: Children Available Help at Discharge: Family;Available PRN/intermittently   Home Access: Ramped entrance     Home Layout: One level Home Equipment: Walker - 2 wheels;Cane - single point      Prior Function Level of Independence: Needs assistance         Comments: Sup/mod indep with SPC vs 4WRW for household mobilization; does  endorse multiple fall history, at least 4 in previous six months.  Actively participating with HHPT prior to admission.  Son assists with household chores, community errands as needed     Higher education careers adviser        Extremity/Trunk Assessment   Upper Extremity Assessment Upper  Extremity Assessment: Overall WFL for tasks assessed    Lower Extremity Assessment Lower Extremity Assessment: Overall WFL for tasks assessed (grossly 4/5 throughout)       Communication   Communication: No difficulties  Cognition Arousal/Alertness: Awake/alert Behavior During Therapy: WFL for tasks assessed/performed Overall Cognitive Status: Within Functional Limits for tasks assessed                                        General Comments      Exercises Other Exercises Other Exercises: Sit/stand from edge of bed x3 with RW, cga; cuing for hand placement.  Does require UE support to complete lift off due to functional weaknss in bilat LEs Other Exercises: 27' with RW, cga/min assist-negotiates turns, obstacles without LOB; cuing to maintain RW on floor with mobility efforts Other Exercises: Orthostatic vitals assessment (see flowsheet for details). Vitals stable and WFL; no subjective/objective reports of orthostasis   Assessment/Plan    PT Assessment Patient needs continued PT services  PT Problem List Decreased strength;Decreased activity tolerance;Decreased balance;Decreased mobility;Cardiopulmonary status limiting activity       PT Treatment Interventions DME instruction;Gait training;Therapeutic activities;Functional mobility training;Therapeutic exercise;Balance training;Patient/family education    PT Goals (Current goals can be found in the Care Plan section)  Acute Rehab PT Goals Patient Stated Goal: to return home, be able to get my cardiac cath in three weeks PT Goal Formulation: With patient Time For Goal Achievement: 11/22/19 Potential to Achieve Goals: Good    Frequency Min 2X/week   Barriers to discharge        Co-evaluation               AM-PAC PT "6 Clicks" Mobility  Outcome Measure Help needed turning from your back to your side while in a flat bed without using bedrails?: None Help needed moving from lying on your back to  sitting on the side of a flat bed without using bedrails?: None Help needed moving to and from a bed to a chair (including a wheelchair)?: A Little Help needed standing up from a chair using your arms (e.g., wheelchair or bedside chair)?: A Little Help needed to walk in hospital room?: A Little Help needed climbing 3-5 steps with a railing? : A Little 6 Click Score: 20    End of Session Equipment Utilized During Treatment: Gait belt Activity Tolerance: Patient tolerated treatment well Patient left: in bed;with call bell/phone within reach Nurse Communication: Mobility status PT Visit Diagnosis: Muscle weakness (generalized) (M62.81);Difficulty in walking, not elsewhere classified (R26.2)    Time: 4098-1191 PT Time Calculation (min) (ACUTE ONLY): 31 min   Charges:   PT Evaluation $PT Eval Moderate Complexity: 1 Mod PT Treatments $Therapeutic Activity: 8-22 mins       Kelsy Polack H. Manson Passey, PT, DPT, NCS 11/08/19, 9:10 AM [phone]   Late entry documentation added 11/08/2019 to reflect session on 11/07/2019.

## 2019-11-08 NOTE — Telephone Encounter (Signed)
Appt has been moved to 11/13/2019 @ 340 pm. TNP

## 2019-11-09 ENCOUNTER — Telehealth: Payer: Self-pay | Admitting: Family Medicine

## 2019-11-09 NOTE — Telephone Encounter (Signed)
[place] with [facility] is calling to recertification for home health orders next week 2 times week 3 weeks, 1 time a week for 1 week. Please advise CB- [phone] ok to verbal on VM

## 2019-11-09 NOTE — Telephone Encounter (Signed)
That's fine

## 2019-11-09 NOTE — Chronic Care Management (AMB) (Signed)
  Chronic Care Management   Note  11/09/2019 Name: [name] MRN: [id number] DOB: [birth year]  [name] [name] is a 49 y.o. year old female who is a primary care patient of Fisher, Kirstie Peri, MD. I reached out to [facility] by phone today in response to a referral sent by Ms. [place] health plan.     Ms. Anniece Bleiler was given information about Chronic Care Management services today including:  1. CCM service includes personalized support from designated clinical staff supervised by her physician, including individualized plan of care and coordination with other care providers 2. 24/7 contact phone numbers for assistance for urgent and routine care needs. 3. Service will only be billed when office clinical staff spend 20 minutes or more in a month to coordinate care. 4. Only one practitioner may furnish and bill the service in a calendar month. 5. The patient may stop CCM services at any time (effective at the end of the month) by phone call to the office staff. 6. The patient will be responsible for cost sharing (co-pay) of up to 20% of the service fee (after annual deductible is met).  Patient agreed to services and verbal consent obtained.   Follow up plan: Telephone appointment with care management team member scheduled for:11/14/2019  [place] Management

## 2019-11-10 ENCOUNTER — Telehealth: Payer: Self-pay | Admitting: Family Medicine

## 2019-11-10 NOTE — Telephone Encounter (Cosign Needed Addendum)
[name] with [facility] Needs verbal recertification for continued pt  2 wk 3 1 wk 1  cb [phone] Option 2

## 2019-11-12 IMAGING — RF DG SWALLOWING FUNCTION
2 series · 8 of 8 positions shown · non-contrast
Comparison: None.

CLINICAL DATA: Dysphagia, history of multiple CVAs.

EXAM:
MODIFIED BARIUM SWALLOW
TECHNIQUE: Different consistencies of barium were administered orally to the
patient by the Speech Pathologist. Imaging of the pharynx was
performed in the lateral projection.
FLUOROSCOPY TIME:  Fluoroscopy Time:  0 minutes, 54 seconds
Radiation Exposure Index (if provided by the fluoroscopic device):
1.5 mGy
Number of Acquired Spot Images: Multiple video loops

[Series 3: run · 4 of 235 frames shown (1 of 2)]
[frame 36/235]
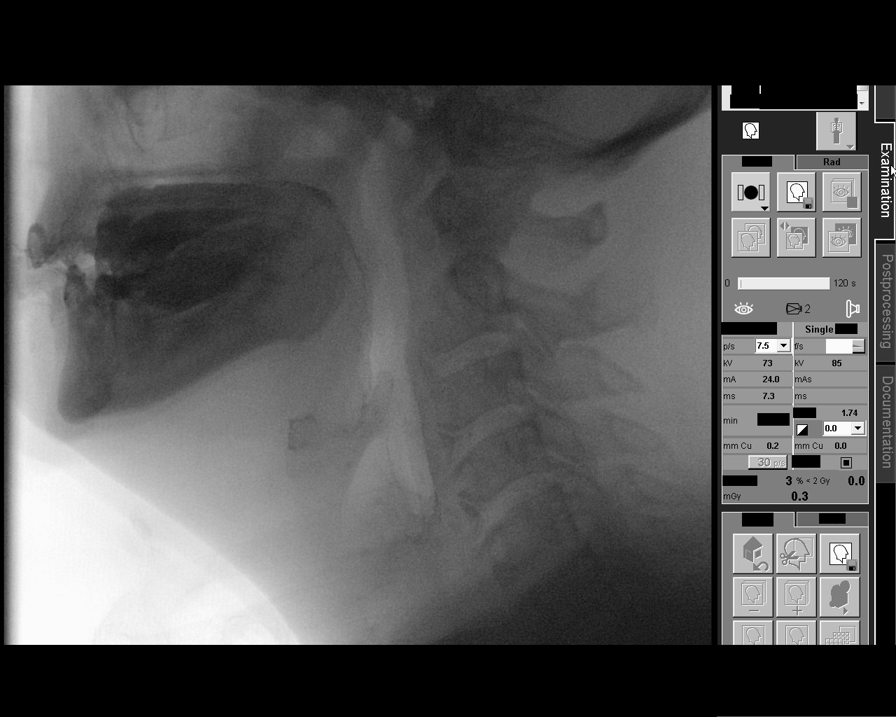
[frame 118/235]
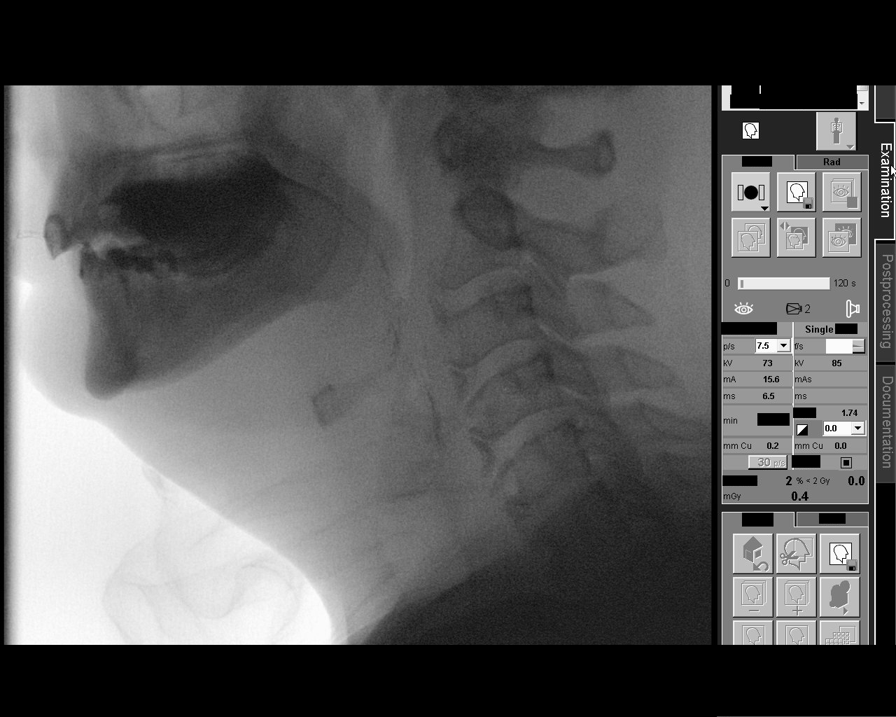
[frame 196/235]
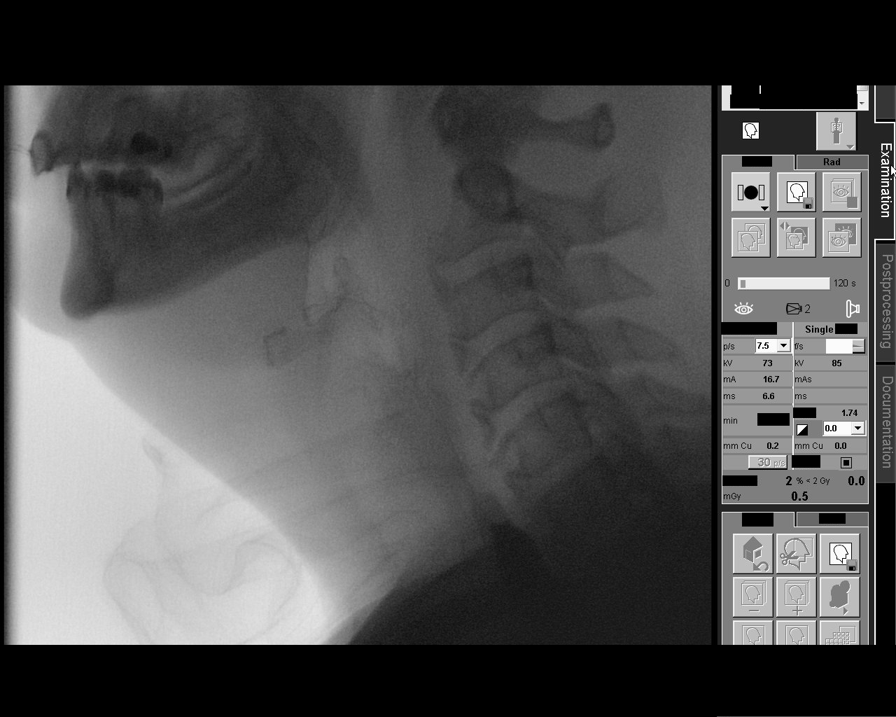
[frame 200/235]
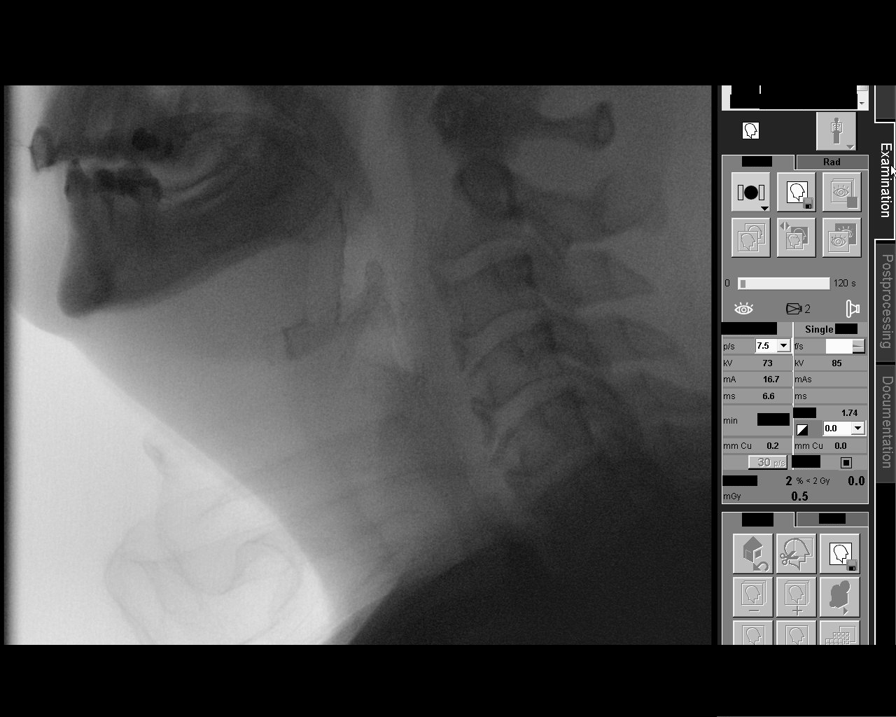

[Series 5: run · 4 of 205 frames shown (2 of 2)]
[frame 31/205]
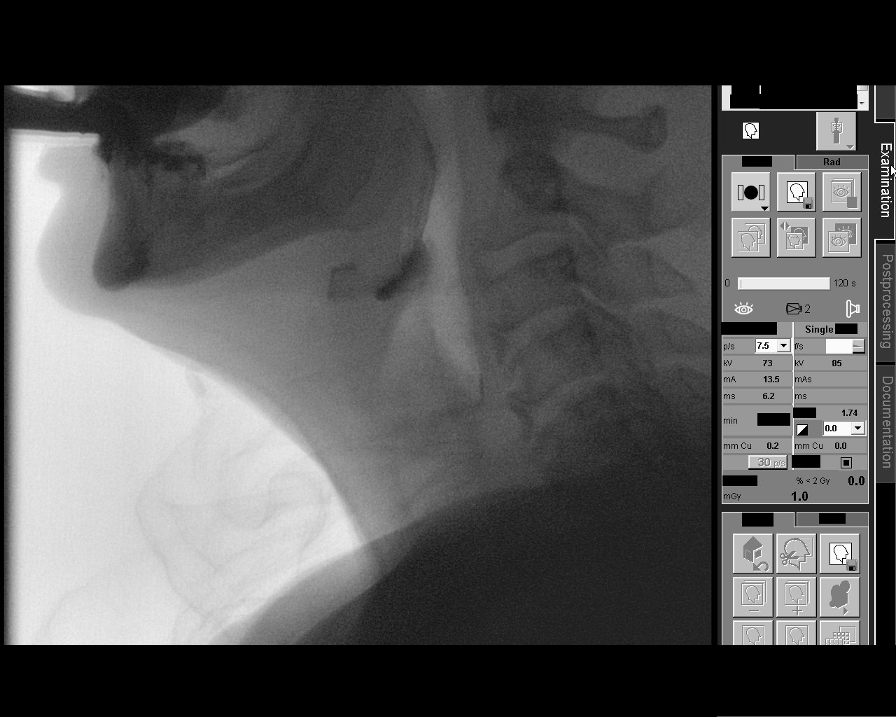
[frame 103/205]
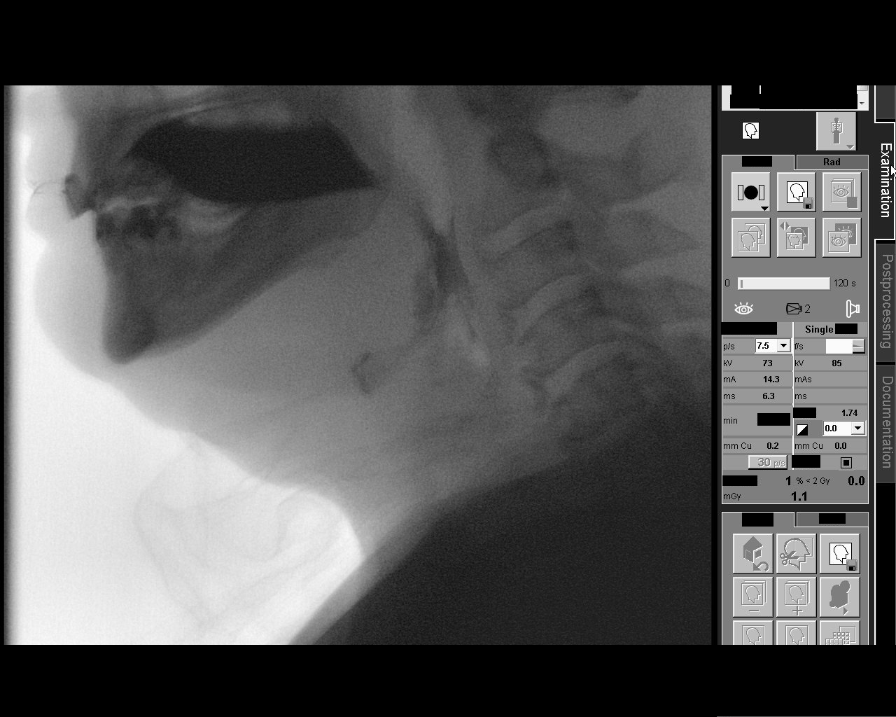
[frame 165/205]
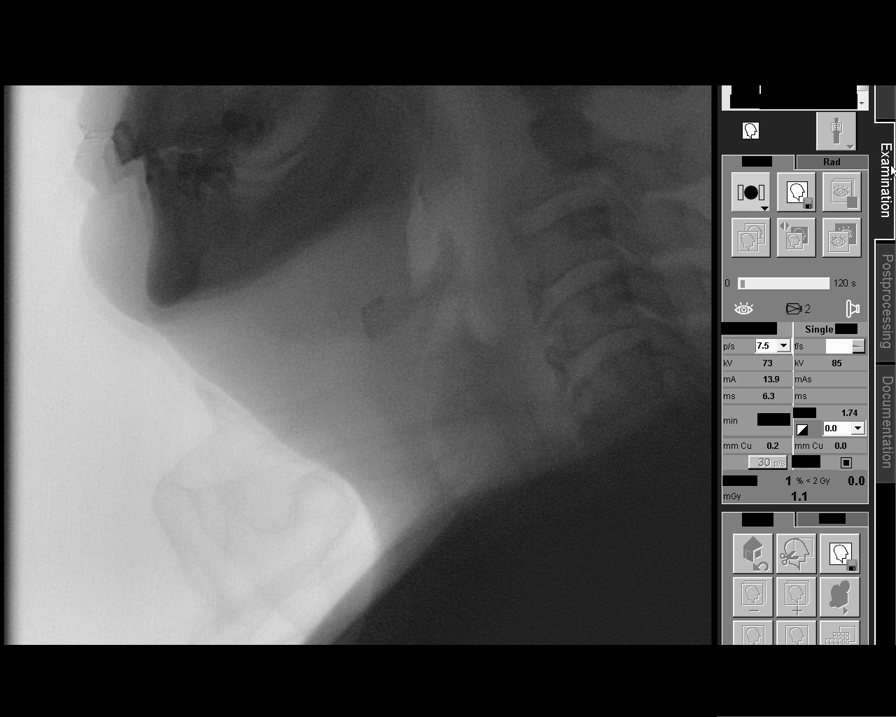
[frame 175/205]
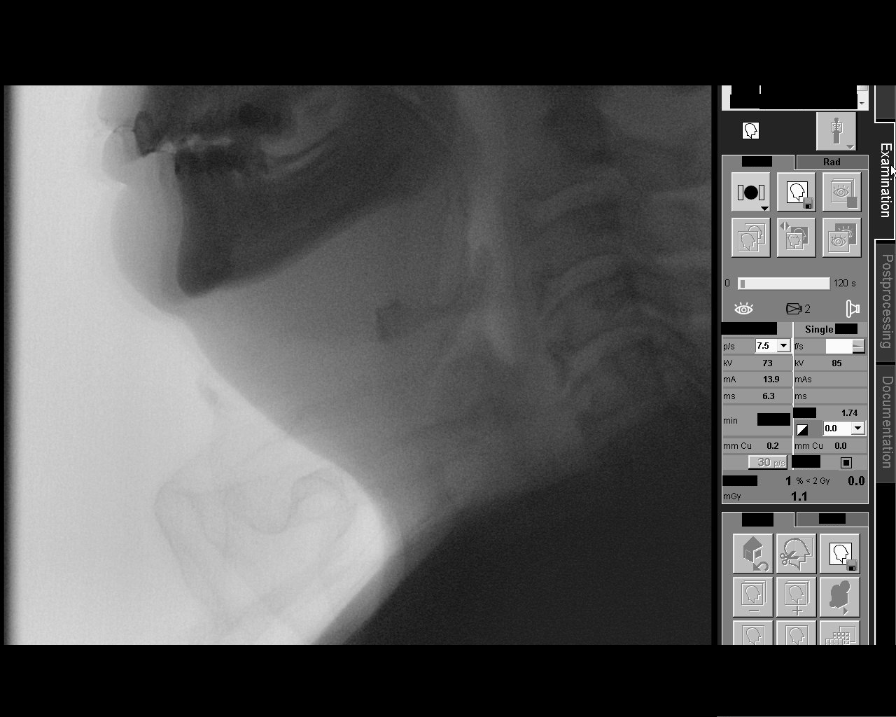

[8 of 8 positions shown; findings below may reference images not displayed]

FINDINGS: Thin liquid- within normal limits. There was no laryngeal
penetration of the barium. There is a small web along the anterior
aspect of the proximal cervical esophagus at the C5-6 disc level.
This is seen during the swallowing maneuver with all barium
consistencies.

Nectar thick liquid- within normal limits

Pure- within normal limits

Pure with cracker- within normal limits
IMPRESSION: No laryngeal penetration of the barium. Normal initiation of the
voluntary portion of the swallowing maneuver. Small esophageal web
traversing approximately [DATE] of the cervical esophageal lumen
anteriorly at the C5-6 level.

Please refer to the Speech Pathologists report for complete details
and recommendations.

## 2019-11-13 ENCOUNTER — Encounter: Payer: Self-pay | Admitting: Family Medicine

## 2019-11-13 ENCOUNTER — Ambulatory Visit (INDEPENDENT_AMBULATORY_CARE_PROVIDER_SITE_OTHER): Payer: Medicare Other | Admitting: Family Medicine

## 2019-11-13 ENCOUNTER — Other Ambulatory Visit: Payer: Self-pay

## 2019-11-13 VITALS — BP 126/87 | HR 87 | Resp 16 | Ht 62.0 in | Wt 155.4 lb

## 2019-11-13 DIAGNOSIS — F0280 Dementia in other diseases classified elsewhere without behavioral disturbance: Secondary | ICD-10-CM | POA: Diagnosis not present

## 2019-11-13 DIAGNOSIS — I255 Ischemic cardiomyopathy: Secondary | ICD-10-CM | POA: Diagnosis not present

## 2019-11-13 DIAGNOSIS — E785 Hyperlipidemia, unspecified: Secondary | ICD-10-CM | POA: Diagnosis not present

## 2019-11-13 DIAGNOSIS — I5042 Chronic combined systolic (congestive) and diastolic (congestive) heart failure: Secondary | ICD-10-CM | POA: Diagnosis not present

## 2019-11-13 DIAGNOSIS — Z9884 Bariatric surgery status: Secondary | ICD-10-CM | POA: Diagnosis not present

## 2019-11-13 DIAGNOSIS — R55 Syncope and collapse: Secondary | ICD-10-CM | POA: Diagnosis not present

## 2019-11-13 DIAGNOSIS — I10 Essential (primary) hypertension: Secondary | ICD-10-CM | POA: Diagnosis not present

## 2019-11-13 DIAGNOSIS — N179 Acute kidney failure, unspecified: Secondary | ICD-10-CM | POA: Diagnosis not present

## 2019-11-13 DIAGNOSIS — E039 Hypothyroidism, unspecified: Secondary | ICD-10-CM | POA: Diagnosis not present

## 2019-11-13 DIAGNOSIS — N1831 Chronic kidney disease, stage 3a: Secondary | ICD-10-CM | POA: Diagnosis not present

## 2019-11-13 DIAGNOSIS — Z95828 Presence of other vascular implants and grafts: Secondary | ICD-10-CM | POA: Diagnosis not present

## 2019-11-13 DIAGNOSIS — K259 Gastric ulcer, unspecified as acute or chronic, without hemorrhage or perforation: Secondary | ICD-10-CM | POA: Diagnosis not present

## 2019-11-13 DIAGNOSIS — I130 Hypertensive heart and chronic kidney disease with heart failure and stage 1 through stage 4 chronic kidney disease, or unspecified chronic kidney disease: Secondary | ICD-10-CM | POA: Diagnosis not present

## 2019-11-13 DIAGNOSIS — I712 Thoracic aortic aneurysm, without rupture: Secondary | ICD-10-CM | POA: Diagnosis not present

## 2019-11-13 DIAGNOSIS — D509 Iron deficiency anemia, unspecified: Secondary | ICD-10-CM | POA: Diagnosis not present

## 2019-11-13 DIAGNOSIS — D631 Anemia in chronic kidney disease: Secondary | ICD-10-CM | POA: Diagnosis not present

## 2019-11-13 DIAGNOSIS — I25118 Atherosclerotic heart disease of native coronary artery with other forms of angina pectoris: Secondary | ICD-10-CM | POA: Diagnosis not present

## 2019-11-13 DIAGNOSIS — E1142 Type 2 diabetes mellitus with diabetic polyneuropathy: Secondary | ICD-10-CM | POA: Diagnosis not present

## 2019-11-13 DIAGNOSIS — K529 Noninfective gastroenteritis and colitis, unspecified: Secondary | ICD-10-CM | POA: Diagnosis not present

## 2019-11-13 DIAGNOSIS — E1122 Type 2 diabetes mellitus with diabetic chronic kidney disease: Secondary | ICD-10-CM | POA: Diagnosis not present

## 2019-11-13 DIAGNOSIS — E11649 Type 2 diabetes mellitus with hypoglycemia without coma: Secondary | ICD-10-CM | POA: Diagnosis not present

## 2019-11-13 DIAGNOSIS — I2699 Other pulmonary embolism without acute cor pulmonale: Secondary | ICD-10-CM | POA: Diagnosis not present

## 2019-11-13 DIAGNOSIS — D126 Benign neoplasm of colon, unspecified: Secondary | ICD-10-CM | POA: Diagnosis not present

## 2019-11-13 DIAGNOSIS — I208 Other forms of angina pectoris: Secondary | ICD-10-CM

## 2019-11-13 DIAGNOSIS — F418 Other specified anxiety disorders: Secondary | ICD-10-CM | POA: Diagnosis not present

## 2019-11-13 DIAGNOSIS — I252 Old myocardial infarction: Secondary | ICD-10-CM | POA: Diagnosis not present

## 2019-11-13 DIAGNOSIS — Z85828 Personal history of other malignant neoplasm of skin: Secondary | ICD-10-CM | POA: Diagnosis not present

## 2019-11-13 DIAGNOSIS — I129 Hypertensive chronic kidney disease with stage 1 through stage 4 chronic kidney disease, or unspecified chronic kidney disease: Secondary | ICD-10-CM | POA: Diagnosis not present

## 2019-11-13 DIAGNOSIS — Z8781 Personal history of (healed) traumatic fracture: Secondary | ICD-10-CM | POA: Diagnosis not present

## 2019-11-13 DIAGNOSIS — I2510 Atherosclerotic heart disease of native coronary artery without angina pectoris: Secondary | ICD-10-CM | POA: Diagnosis not present

## 2019-11-13 DIAGNOSIS — K219 Gastro-esophageal reflux disease without esophagitis: Secondary | ICD-10-CM | POA: Diagnosis not present

## 2019-11-13 DIAGNOSIS — R1310 Dysphagia, unspecified: Secondary | ICD-10-CM | POA: Diagnosis not present

## 2019-11-13 DIAGNOSIS — I69354 Hemiplegia and hemiparesis following cerebral infarction affecting left non-dominant side: Secondary | ICD-10-CM | POA: Diagnosis not present

## 2019-11-13 DIAGNOSIS — R296 Repeated falls: Secondary | ICD-10-CM | POA: Diagnosis not present

## 2019-11-13 DIAGNOSIS — Z87440 Personal history of urinary (tract) infections: Secondary | ICD-10-CM | POA: Diagnosis not present

## 2019-11-13 DIAGNOSIS — F319 Bipolar disorder, unspecified: Secondary | ICD-10-CM | POA: Diagnosis not present

## 2019-11-13 DIAGNOSIS — G709 Myoneural disorder, unspecified: Secondary | ICD-10-CM | POA: Diagnosis not present

## 2019-11-13 DIAGNOSIS — S43431D Superior glenoid labrum lesion of right shoulder, subsequent encounter: Secondary | ICD-10-CM | POA: Diagnosis not present

## 2019-11-13 NOTE — Telephone Encounter (Signed)
That's fine

## 2019-11-13 NOTE — Telephone Encounter (Signed)
Verbal order given to [facility] with [facility].

## 2019-11-13 NOTE — Progress Notes (Signed)
Established patient visit   Patient: [name]   DOB: 1970/05/31   49 y.o. Female  MRN: [id number] Visit Date: 11/13/2019  Today's healthcare provider: [name], MD   Chief Complaint  Patient presents with  . Hospitalization Follow-up   Subjective    HPI  Patient was hospitalized overnight on 10/4 for chest pain. She was found to have right upper lobe PE and started on Eliquis which she is taking and tolerating well. She has stopped aspirin and continues on Plavix. She had AKI with cr of 1.08 on admission improving to 1.5 with IVF at time of discharge. She continues to have intermittent chest pain and has taken nitroglycerine twice since discharge. She is scheduled for cardiology follow up on 11/16/2019. She states she still gets short of breath with minimal exertion. She did have echocardiogram showing her EF has rebounded to 60-65%.      Medications: Outpatient Medications Prior to Visit  Medication Sig  . ALPRAZolam (XANAX) 1 MG tablet Take 1 tablet by mouth twice daily as needed (Patient taking differently: Take 1 mg by mouth 2 (two) times daily as needed for anxiety. )  . amLODipine (NORVASC) 2.5 MG tablet Take 1 tablet (2.5 mg total) by mouth daily. (Patient taking differently: Take 2.5 mg by mouth every evening. )  . APIXABAN (ELIQUIS) VTE STARTER PACK (10MG  AND 5MG ) Take as directed on package: start with two-5mg  tablets twice daily for 7 days. On day 8, switch to one-5mg  tablet twice daily.  Marland Kitchen buPROPion (WELLBUTRIN XL) 300 MG 24 hr tablet Take 1 tablet by mouth once daily (Patient taking differently: Take 300 mg by mouth daily. )  . carvedilol (COREG) 12.5 MG tablet Take 1 tablet (12.5 mg total) by mouth 2 (two) times daily with a meal.  . EUTHYROX 25 MCG tablet TAKE 1 TABLET BY MOUTH ONCE DAILY BEFORE BREAKFAST (Patient taking differently: Take 25 mcg by mouth daily before breakfast. )  . FIBER COMPLETE PO Take 5 mg by mouth.  . gabapentin (NEURONTIN) 300  MG capsule Take 1 capsule (300 mg total) by mouth 2 (two) times daily.  . isosorbide mononitrate (IMDUR) 120 MG 24 hr tablet Take 1 tablet (120 mg total) by mouth daily.  . Multiple Vitamin (MULTIVITAMIN) capsule Take 1 capsule by mouth daily. gummies  . nitroGLYCERIN (NITROSTAT) 0.4 MG SL tablet Place 1 tablet (0.4 mg total) under the tongue every 5 (five) minutes as needed for chest pain.  . pantoprazole (PROTONIX) 40 MG tablet Take 1 tablet by mouth twice daily (Patient taking differently: Take 40 mg by mouth 2 (two) times daily. )  . promethazine (PHENERGAN) 25 MG tablet Take 25 mg by mouth every 6 (six) hours as needed for nausea or vomiting.   . Psyllium (METAMUCIL FIBER PO) Take 5 g by mouth daily. gummies  . rosuvastatin (CRESTOR) 40 MG tablet Take 1 tablet (40 mg total) by mouth daily at 6 PM.  . traZODone (DESYREL) 100 MG tablet Take 100-200 mg by mouth at bedtime as needed for sleep.   . UBRELVY 100 MG TABS Take 1 tablet by mouth 2 (two) times daily as needed (migraine).   . venlafaxine XR (EFFEXOR-XR) 150 MG 24 hr capsule Take 1 capsule (150 mg total) by mouth daily with breakfast.   No facility-administered medications prior to visit.    Review of Systems  Constitutional: Negative for appetite change, chills, fatigue and fever.  Respiratory: Positive for chest tightness and shortness of  breath. Negative for wheezing.   Cardiovascular: Positive for palpitations.  Gastrointestinal: Negative for abdominal pain, nausea and vomiting.  Neurological: Negative for dizziness and weakness.      Objective    BP 126/87   Pulse 87   Resp 16   Ht 5\' 2"  (1.575 m)   Wt 155 lb 6.4 oz (70.5 kg)   SpO2 97% Comment: r/a  BMI 28.42 kg/m    Physical Exam    General: Appearance:     Well developed, well nourished female in no acute distress  Eyes:    PERRL, conjunctiva/corneas clear, EOM's intact       Lungs:     Clear to auscultation bilaterally, respirations unlabored  Heart:     Normal heart rate. Normal rhythm. No murmurs, rubs, or gallops.   MS:   All extremities are intact.   Neurologic:   Awake, alert, oriented x 3. No apparent focal neurological           defect.          Assessment & Plan     1. Acute pulmonary embolism without acute cor pulmonale, unspecified pulmonary embolism type ([place]) Doing well with initiation of Eliquis. Is off of ASA. Still have chest pressure, although unclear if this is related to PE or chronic angina. Continue Eliquis.   2. Coronary artery disease of native artery of native heart with stable angina pectoris ([place]) Scheduled for cardiology follow up later this week.   3. Ischemic cardiomyopathy   4. Effort angina (HCC) Is well stocked with nitroglycerine which she takes occasionally.   5. Acute kidney injury ([place]) Corrected to baseline by the time she was discharged from [facility] last week. She is going to call Dr. Milana Huntsman to schedule early follow up.   6. Essential hypertension Currently well controlled.        The entirety of the information documented in the History of Present Illness, Review of Systems and Physical Exam were personally obtained by me. Portions of this information were initially documented by the CMA and reviewed by me for thoroughness and accuracy.      [name], MD  [facility] [phone] (phone) [phone] (fax)  [place]

## 2019-11-14 ENCOUNTER — Telehealth: Payer: Self-pay

## 2019-11-14 ENCOUNTER — Telehealth: Payer: BC Managed Care – PPO

## 2019-11-14 NOTE — Telephone Encounter (Signed)
  Chronic Care Management   Outreach Note  11/14/2019 Name: Zariel Capano MRN: [id number] DOB: 09-13-1970  Primary Care Provider: [name], MD Reason for referral : Chronic Care Management   An unsuccessful telephone outreach was attempted today. Ms. [name] was referred to the case management team for assistance with care management and care coordination.    PLAN A HIPAA compliant voice message was left today requesting a return call.    C[facility] [phone]

## 2019-11-15 ENCOUNTER — Other Ambulatory Visit: Payer: Self-pay | Admitting: Family Medicine

## 2019-11-15 DIAGNOSIS — I2699 Other pulmonary embolism without acute cor pulmonale: Secondary | ICD-10-CM | POA: Diagnosis not present

## 2019-11-15 DIAGNOSIS — E11649 Type 2 diabetes mellitus with hypoglycemia without coma: Secondary | ICD-10-CM | POA: Diagnosis not present

## 2019-11-15 DIAGNOSIS — I25118 Atherosclerotic heart disease of native coronary artery with other forms of angina pectoris: Secondary | ICD-10-CM | POA: Diagnosis not present

## 2019-11-15 DIAGNOSIS — R55 Syncope and collapse: Secondary | ICD-10-CM | POA: Diagnosis not present

## 2019-11-15 DIAGNOSIS — I252 Old myocardial infarction: Secondary | ICD-10-CM | POA: Diagnosis not present

## 2019-11-15 DIAGNOSIS — E1142 Type 2 diabetes mellitus with diabetic polyneuropathy: Secondary | ICD-10-CM | POA: Diagnosis not present

## 2019-11-16 ENCOUNTER — Encounter: Payer: Self-pay | Admitting: Family

## 2019-11-16 ENCOUNTER — Ambulatory Visit (INDEPENDENT_AMBULATORY_CARE_PROVIDER_SITE_OTHER): Payer: Medicare Other | Admitting: Family

## 2019-11-16 ENCOUNTER — Other Ambulatory Visit: Payer: Self-pay

## 2019-11-16 ENCOUNTER — Telehealth: Payer: Self-pay | Admitting: Family

## 2019-11-16 VITALS — Ht 62.0 in | Wt 156.0 lb

## 2019-11-16 DIAGNOSIS — I25118 Atherosclerotic heart disease of native coronary artery with other forms of angina pectoris: Secondary | ICD-10-CM | POA: Diagnosis not present

## 2019-11-16 DIAGNOSIS — I255 Ischemic cardiomyopathy: Secondary | ICD-10-CM | POA: Diagnosis not present

## 2019-11-16 DIAGNOSIS — I951 Orthostatic hypotension: Secondary | ICD-10-CM

## 2019-11-16 DIAGNOSIS — E785 Hyperlipidemia, unspecified: Secondary | ICD-10-CM

## 2019-11-16 DIAGNOSIS — I739 Peripheral vascular disease, unspecified: Secondary | ICD-10-CM

## 2019-11-16 NOTE — Telephone Encounter (Signed)
Recommendation sent via MyChart (she may need additional clarification sent to the MyChart message). Thanks!  Previous syncope in the setting of PE. No indication for further cardiac evaluation of syncope. So long as she is not having recurrent lightheadedness, dizziness, near syncope, syncope okay to resume driving. As discussed in clinic, recommend careful monitoring of BP at home and changing positions slowly.  Loel Dubonnet, NP

## 2019-11-16 NOTE — Telephone Encounter (Signed)
Patient seen by Gilford Rile and calling to see if she can resume driving .

## 2019-11-16 NOTE — Patient Instructions (Addendum)
Medication Instructions:  No medication changes today.  *If you need a refill on your cardiac medications before your next appointment, please call your pharmacy*  Lab Work: No lab work today.  Testing/Procedures: None ordered today.   Follow-Up: At [facility], you and your health needs are our priority.  As part of our continuing mission to provide you with exceptional heart care, we have created designated Provider Care Teams.  These Care Teams include your primary Cardiologist (physician) and Advanced Practice Providers (APPs -  Physician Assistants and Nurse Practitioners) who all work together to provide you with the care you need, when you need it.  We recommend signing up for the patient portal called "MyChart".  Sign up information is provided on this After Visit Summary.  MyChart is used to connect with patients for Virtual Visits (Telemedicine).  Patients are able to view lab/test results, encounter notes, upcoming appointments, etc.  Non-urgent messages can be sent to your provider as well.   To learn more about what you can do with MyChart, go to [email or web address].    Your next appointment:   To be determined based on the timing of your potential catheterization with Dr. [name] or APP  Other Instructions Please be sure you are staying well hydrated as this will help your blood pressure from dropping.   Laurann Montana, NP will reach out to Dr. Fletcher Anon to potentially schedule your catheterization in [employer].

## 2019-11-16 NOTE — Progress Notes (Signed)
Office Visit    Patient Name: [name] Date of Encounter: 11/16/2019  Primary Care Provider:  [name], MD Primary Cardiologist:  Ida Rogue, MD Electrophysiologist:  None   Chief Complaint    [name] is a 49 y.o. female with a hx of CAD s/p prior PCI to LAD and D1 stable coronary anatomy by most recent cath 04/2019, HFrEF secondary to ischemic cardiomyopathy with prior EF 45 to 50% in 2016 with subsequent normalization of LVEF by echo 10/2015, CKD stage III, prior CVA, left carotid to subclavian artery bypass with subclavian artery ligation, aortic arch aneurysm, anemia, obesity s/p gastric bypass, PE, bipolar disorder, DM 2 with polyneuropathy and hyperglycemia, migraine disorder, HTN, HLD, frequent falls/syncope/near syncope presents today for hospital follow up.   Past Medical History    Past Medical History:  Diagnosis Date  . Acute colitis 01/27/2017  . Acute pyelonephritis   . Anemia    iron deficiency anemia  . Aortic arch aneurysm ([place])   . BRCA negative 2014  . CAD (coronary artery disease)    a. 08/2003 Cath: LAD 30-40-med Rx; b. 11/2014 PCI: LAD 23m(3.25x23 Xience Alpine DES); c. 06/2015 PCI: D1 (2.25x12 Resolute Integrity DES); d. 06/2017 PCI: Patent mLAD stent, D2 95 (PTCA); e. 09/2017 PCI: D2 99ost (CBA); d. 12/2017 Cath: LM nl, LAD 331m80d (small), D1 40ost, D2 95ost, LCX 40p, RCA 40ost/p->Med rx for D2 given restenosis.  . Colitis 06/03/2015  . Colon polyp   . CVA (cerebral vascular accident) ([facility]   Left side weakness.   . Degenerative tear of glenoid labrum of right shoulder 03/15/2017  . Diabetes mellitus without complication ([place]  . Family history of breast cancer    BRCA neg 2014  . Femur fracture, left ([facility]9/10/2018  . Gastric ulcer 04/27/2011  . History of echocardiogram    a. 03/2017 Echo: EF 60-65%, no rwma; b. 02/2018 Echo: EF 60-65%, no rwma. Nl RV fxn. No cardiac source of emboli (admitted w/ stroke).  .  Hypertension   . Malignant melanoma of skin of scalp ([facility]  . MI, acute, non ST segment elevation ([facility]  . Neuromuscular disorder ([place]  . S/P drug eluting coronary stent placement 06/04/2015  . Sepsis ([facility]/14/[place]  . [facility]   a. 02/2018 MRI: 31m71mate acute/early subacute L medial frontal lobe inarct; b. 02/2018 MRA No large vessel occlusion or aneurysm. Mod to sev L P2 stenosis. thready L vertebral artery, diffusely dzs'd; c. 02/2018 Carotid U/S: <50% bilat ICA dzs.   Past Surgical History:  Procedure Laterality Date  . APPENDECTOMY    . CARDIAC CATHETERIZATION N/A 11/09/2014   Procedure: Coronary Angiography;  Surgeon: TimMinna MerrittsD;  [facility];  Service: Cardiovascular;  Laterality: N/A;  . CARDIAC CATHETERIZATION N/A 11/12/2014   Procedure: Coronary Stent Intervention;  Surgeon: AleIsaias CowmanD;  [facility];  Service: Cardiovascular;  Laterality: N/A;  . CARDIAC CATHETERIZATION N/A 04/18/2015   Procedure: Left Heart Cath and Coronary Angiography;  Surgeon: TimMinna MerrittsD;  [facility];  Service: Cardiovascular;  Laterality: N/A;  . CARDIAC CATHETERIZATION Left 06/04/2015   Procedure: Left Heart Cath and Coronary Angiography;  Surgeon: MuhWellington HampshireD;  [facility];  Service: Cardiovascular;  Laterality: Left;  . CARDIAC CATHETERIZATION N/A 06/04/2015   Procedure: Coronary Stent Intervention;  Surgeon: MuhWellington HampshireD;  Location: [place] [facility];  Service: Cardiovascular;  Laterality: N/A;  . CESAREAN SECTION  2001  . CHOLECYSTECTOMY N/A 11/18/2016   Procedure: LAPAROSCOPIC CHOLECYSTECTOMY WITH INTRAOPERATIVE CHOLANGIOGRAM;  Surgeon: Christene Lye, MD;  Location: [facility];  Service: General;  Laterality: N/A;  . COLONOSCOPY WITH PROPOFOL N/A 04/27/2016   Procedure: COLONOSCOPY WITH PROPOFOL;  Surgeon: [name], MD;  Location: [place];  Service:  Endoscopy;  Laterality: N/A;  . COLONOSCOPY WITH PROPOFOL N/A 01/12/2018   Procedure: COLONOSCOPY WITH PROPOFOL;  Surgeon: Toledo, Benay Pike, MD;  Location: [facility];  Service: Endoscopy;  Laterality: N/A;  . CORONARY ANGIOPLASTY    . CORONARY BALLOON ANGIOPLASTY N/A 06/29/2017   Procedure: CORONARY BALLOON ANGIOPLASTY;  Surgeon: Wellington Hampshire, MD;  Location: [place] [facility];  Service: Cardiovascular;  Laterality: N/A;  . CORONARY BALLOON ANGIOPLASTY N/A 09/20/2017   Procedure: CORONARY BALLOON ANGIOPLASTY;  Surgeon: Wellington Hampshire, MD;  Location: [place] [facility];  Service: Cardiovascular;  Laterality: N/A;  . DILATION AND CURETTAGE OF UTERUS    . ESOPHAGOGASTRODUODENOSCOPY (EGD) WITH PROPOFOL N/A 09/14/2014   Procedure: ESOPHAGOGASTRODUODENOSCOPY (EGD) WITH PROPOFOL;  Surgeon: Josefine Class, MD;  [facility];  Service: Endoscopy;  Laterality: N/A;  . ESOPHAGOGASTRODUODENOSCOPY (EGD) WITH PROPOFOL N/A 04/27/2016   Procedure: ESOPHAGOGASTRODUODENOSCOPY (EGD) WITH PROPOFOL;  Surgeon: [name], MD;  Location: [place];  Service: Endoscopy;  Laterality: N/A;  Diabetic - oral meds  . ESOPHAGOGASTRODUODENOSCOPY (EGD) WITH PROPOFOL N/A 01/12/2018   Procedure: ESOPHAGOGASTRODUODENOSCOPY (EGD) WITH PROPOFOL;  Surgeon: Toledo, Benay Pike, MD;  Location: [facility];  Service: Endoscopy;  Laterality: N/A;  . ESOPHAGOGASTRODUODENOSCOPY (EGD) WITH PROPOFOL N/A 04/11/2019   Procedure: ESOPHAGOGASTRODUODENOSCOPY (EGD) WITH PROPOFOL;  Surgeon: [name], MD;  Location: [facility];  Service: Endoscopy;  Laterality: N/A;  . GASTRIC BYPASS  09/2009   [place] (IM) NAIL INTERTROCHANTERIC Left 10/13/2018   Procedure: INTRAMEDULLARY (IM) NAIL INTERTROCHANTRIC;  Surgeon: Leandrew Koyanagi, MD;  Location: [place];  Service: Orthopedics;  Laterality: Left;  . Left Carotid to sublcavian artery bypass w/ subclavian artery ligation       a. Performed @ [place].  . LEFT HEART CATH AND CORONARY ANGIOGRAPHY Left 06/29/2017   Procedure: LEFT HEART CATH AND CORONARY ANGIOGRAPHY;  Surgeon: Wellington Hampshire, MD;  Location: [place] [facility];  Service: Cardiovascular;  Laterality: Left;  . LEFT HEART CATH AND CORONARY ANGIOGRAPHY N/A 09/20/2017   Procedure: LEFT HEART CATH AND CORONARY ANGIOGRAPHY;  Surgeon: Wellington Hampshire, MD;  Location: [place] [facility];  Service: Cardiovascular;  Laterality: N/A;  . LEFT HEART CATH AND CORONARY ANGIOGRAPHY N/A 12/20/2017   Procedure: LEFT HEART CATH AND CORONARY ANGIOGRAPHY;  Surgeon: Wellington Hampshire, MD;  Location: [place] [facility];  Service: Cardiovascular;  Laterality: N/A;  . LEFT HEART CATH AND CORONARY ANGIOGRAPHY N/A 04/20/2019   Procedure: LEFT HEART CATH AND CORONARY ANGIOGRAPHY possible PCI;  Surgeon: Minna Merritts, MD;  Location: [place] [facility];  Service: Cardiovascular;  Laterality: N/A;  . MELANOMA EXCISION  2016   Dr. Evorn Gong  . [place]  2002  . RIGHT OOPHORECTOMY    . SHOULDER ARTHROSCOPY WITH OPEN ROTATOR CUFF REPAIR Right 01/07/2016   Procedure: SHOULDER ARTHROSCOPY WITH DEBRIDMENT, SUBACHROMIAL DECOMPRESSION;  Surgeon: Corky Mull, MD;  Location: [facility];  Service: Orthopedics;  Laterality: Right;  . SHOULDER ARTHROSCOPY WITH OPEN ROTATOR CUFF REPAIR Right 03/16/2017   Procedure: SHOULDER ARTHROSCOPY  WITH OPEN ROTATOR CUFF REPAIR POSSIBLE BICEPS TENODESIS;  Surgeon: Corky Mull, MD;  Location: [facility];  Service: Orthopedics;  Laterality: Right;  . TRIGGER FINGER RELEASE Right     Middle Finger    Allergies  Allergies  Allergen Reactions  . Lipitor [Atorvastatin] Other (See Comments)    Leg pains  . Tramadol Other (See Comments)    Mouth feels like it's on fire    History of Present Illness    Eira Marita [place] Tobin [name] is a 49 y.o. female with a hx of CAD s/p prior PCI to LAD and D1 stable coronary anatomy by most recent cath 04/2019,  HFrEF secondary to ischemic cardiomyopathy with prior EF 45 to 50% in 2016 with subsequent normalization of LVEF by echo 10/2015, CKD stage III, prior CVA, left carotid to subclavian artery bypass with subclavian artery ligation, aortic arch aneurysm, anemia, obesity s/p gastric bypass, PE, bipolar disorder, DM 2 with polyneuropathy and hyperglycemia, migraine disorder, HTN, HLD, frequent falls/syncope/near syncope.  She was last seen while hospitalized.  Diagnosis CAD in July 2005 with LHC at the time withmoderate LAD disease, medically managed. October 2016 underwent LHC with PCI/DES to mid LAD.  Escalation of antianginal therapy has been limited by hypotension or adverse effects.  May 2017 had repeat LHC with pain LAD stent with element of significant D1 disease treated with PCI/DES.  Recurrent angina 06/2017 LHC showed severe stenosis in D2 s/p PTCA.  Recurrent angina July 2019 with repeat LHC with restenosis of D2 treated with [place] angioplasty.  LHC 12/2017 with 95% ostial stenosis/restenosis of D2 and significant apical LAD disease.  Medical therapy was felt to be warranted.  January 2020 for CVA and MRI showing late acute/early subacute left medial frontal lobe infarct.  Echo at that time with normal LVEF without cardiac source of emboli.  Admitted 04/2019 with chest pain in the setting of hypertensive urgency.  Cardiac enzymes negative.  LHC 04/20/2019 with no significant change in coronary anatomy dating back to cardiac cath in 2019 severe native vessel CAD, patent stents in LAD and diagonal, stable severe ostial/proximal D2 disease is without changes when compared to prior not amenable to stenting.  Distal LAD was small and tapered to severe disease which was not amenable to stenting.  Medical management was recommended.  Seen in the ED 09/01/2019 with hyperglycemia, dysphagia, intermittent chest pain with high-sensitivity troponin negative x3 and EKG nonacute.  She returned to the ED the next day  with continued hypoglycemic episodes, MRI abdomen unrevealing, she noted chest pressure with EKG nonacute high-sensitivity troponin negative x2.  No cardiac intervention recommended at that time.  She was seen in the office 09/28/2019.  At that time she was noted to be hypotensive in the setting of multiple medications such as Xanax.  Her amlodipine was discontinued.  She was admitted 10/13/2019 for hypoglycemia.  Her EKG and troponin were without evidence for acute ischemia.  Her Coreg was titrated to 12.5 mg twice daily and Imdur titrated to 120 mg.  She did have fever with mildly elevated procalcitonin and she was treated with Rocephin and doxycycline.  Covid testing negative x2.  Readmitted 11/05/2019-11/07/2019 for chest pain.  Symptoms improved after administration of nitroglycerin x3.  She had TTE without acute insult in relation to chest pain.  Elevated D-dimer on admission with VQ scan significant for RUL PE.  She was started on Eliquis and PE thought to be provoked in setting of recent hospitalization.  She also noted syncope  prior to admission with significantly elevated heart rate at that time as well as diaphoresis.  Presents today for follow up. No recurrent lightheadedness, dizziness, near syncope, syncope. Her BP dropped today during orthostatics but she was asymptomatic. Reports continued chest discomfort similar to previous. She is using nitroglycerin sparingly. Continues to be very interested in having repeat LHC and consideration for complex PCI of LAD/D1 bifurcation which would be high risk.  Chief complaint today of nausea and tachycardia. She has noticed some tachycardia with HR up to 160 when checked at home. This is typically followed by chest discomfort.  Tells me the Imdur is making her "keep a headache". She has been taking her migraine medicine without relief. She did eat this morning she had [employer]fil[employer] hashbrown and a regular Pepsi. Tells me she gets headaches if she does not have  caffeine. She also drinks 'Body Armour' and flavored water.   She reports her dyspnea on exertion is similar compared to hospital discharge. Denies missed doses of anticoagulation.  EKGs/Labs/Other Studies Reviewed:   The following studies were reviewed today:  Echo 11/06/19  1. Left ventricular ejection fraction, by estimation, is 60 to 65%. The  left ventricle has normal function. The left ventricle has no regional  wall motion abnormalities. There is mild left ventricular hypertrophy.  Left ventricular diastolic parameters  are consistent with Grade I diastolic dysfunction (impaired relaxation).   2. Right ventricular systolic function was not well visualized. The right  ventricular size is mildly enlarged.   3. Left atrial size was mildly dilated.   4. Right atrial size was mildly dilated.   5. The mitral valve is grossly normal. Trivial mitral valve  regurgitation.   6. The aortic valve was not well visualized. Aortic valve regurgitation  is not visualized.   LHC 04/20/2019: Coronary dominance: Right  Left mainstem:   Large vessel that bifurcates into the LAD and left circumflex, no significant disease noted  Left anterior descending (LAD):   Large vessel that extends to the apical region, diagonal branch #1 of moderate size with patent stent , diagonal #2 small to moderate in size with severe ostial/proximal disease, severe distal disease estimated 80%  Left circumflex (LCx):  Large vessel with OM branch 2, mild to moderate mid vessel disease  Right coronary artery (RCA):  Right dominant vessel with PL and PDA, no significant disease noted , very mild mid vessel disease  Left ventriculography: Left ventricular systolic function is normal, LVEF is estimated at 55-65%, there is no significant mitral regurgitation , no significant aortic valve stenosis  Final Conclusions:   In comparison to images from catheterization 2019 no significant change Severe native coronary  disease, Patent stents LAD and diagonal vessel Stable severe ostial/proximal diagonal #2 disease, no change from previous catheterization 2019, not amenable to stenting Distal LAD is small and tapers, severe disease, not amenable to stenting  Recommendations:  Continue aggressive medical management, LDL less than 70, aggressive diabetes control, antianginals Of note left ventricular end-diastolic pressure was low consistent with dehydration, would continue IV fluids __________   2D echo 01/2019: 1. Left ventricular ejection fraction, by visual estimation, is 60 to  65%. The left ventricle has normal function. There is no left ventricular  hypertrophy.   2. Left ventricular diastolic parameters are consistent with Grade I  diastolic dysfunction (impaired relaxation).   3. The left ventricle has no regional wall motion abnormalities.   4. Global right ventricle has normal systolic function.The right  ventricular size is normal.  No increase in right ventricular wall  thickness.   5. Left atrial size was normal.   6. TR signal is inadequate for assessing pulmonary artery systolic  pressure.    EKG:  No EKG today.  Recent Labs: 10/17/2019: ALT 22 10/18/2019: TSH 1.544 10/20/2019: Magnesium 2.1 11/05/2019: B Natriuretic Peptide 24.9 11/07/2019: BUN 26; Creatinine, Ser 1.48; Hemoglobin 11.1; Platelets 177; Potassium 4.5; Sodium 143  Recent Lipid Panel    Component Value Date/Time   CHOL 94 11/06/2019 0519   CHOL 159 05/28/2016 1212   CHOL 197 12/30/2013 0355   TRIG 84 11/06/2019 0519   TRIG 261 (H) 12/30/2013 0355   HDL 29 (L) 11/06/2019 0519   HDL 34 (L) 05/28/2016 1212   HDL 28 (L) 12/30/2013 0355   CHOLHDL 3.2 11/06/2019 0519   VLDL 17 11/06/2019 0519   VLDL 52 (H) 12/30/2013 0355   LDLCALC 48 11/06/2019 0519   LDLCALC 103 (H) 12/08/2016 1221   LDLCALC 117 (H) 12/30/2013 0355   LDLDIRECT 83.3 01/26/2011 0913    Home Medications   Current Meds  Medication Sig  .  ALPRAZolam (XANAX) 1 MG tablet Take 1 tablet by mouth twice daily as needed (Patient taking differently: Take 1 mg by mouth 2 (two) times daily as needed for anxiety. )  . amLODipine (NORVASC) 2.5 MG tablet Take 1 tablet (2.5 mg total) by mouth daily. (Patient taking differently: Take 2.5 mg by mouth every evening. )  . APIXABAN (ELIQUIS) VTE STARTER PACK (10MG AND 5MG) Take as directed on package: start with two-53m tablets twice daily for 7 days. On day 8, switch to one-532mtablet twice daily.  . Marland KitchenuPROPion (WELLBUTRIN XL) 300 MG 24 hr tablet Take 1 tablet by mouth once daily (Patient taking differently: Take 300 mg by mouth daily. )  . carvedilol (COREG) 12.5 MG tablet Take 1 tablet (12.5 mg total) by mouth 2 (two) times daily with a meal.  . EUTHYROX 25 MCG tablet TAKE 1 TABLET BY MOUTH ONCE DAILY BEFORE BREAKFAST (Patient taking differently: Take 25 mcg by mouth daily before breakfast. )  . FIBER COMPLETE PO Take 5 mg by mouth.  . gabapentin (NEURONTIN) 300 MG capsule Take 1 capsule (300 mg total) by mouth 2 (two) times daily.  . isosorbide mononitrate (IMDUR) 120 MG 24 hr tablet Take 1 tablet (120 mg total) by mouth daily.  . Multiple Vitamin (MULTIVITAMIN) capsule Take 1 capsule by mouth daily. gummies  . nitroGLYCERIN (NITROSTAT) 0.4 MG SL tablet Place 1 tablet (0.4 mg total) under the tongue every 5 (five) minutes as needed for chest pain.  . pantoprazole (PROTONIX) 40 MG tablet Take 1 tablet by mouth twice daily (Patient taking differently: Take 40 mg by mouth 2 (two) times daily. )  . promethazine (PHENERGAN) 25 MG tablet Take 25 mg by mouth every 6 (six) hours as needed for nausea or vomiting.   . Psyllium (METAMUCIL FIBER PO) Take 5 g by mouth daily. gummies  . rosuvastatin (CRESTOR) 40 MG tablet Take 1 tablet (40 mg total) by mouth daily at 6 PM.  . traZODone (DESYREL) 100 MG tablet TAKE 1 TO 2 TABLETS BY MOUTH AT BEDTIME  . UBRELVY 100 MG TABS Take 1 tablet by mouth 2 (two) times daily  as needed (migraine).   . venlafaxine XR (EFFEXOR-XR) 150 MG 24 hr capsule Take 1 capsule (150 mg total) by mouth daily with breakfast.    Review of Systems    All other systems reviewed and are otherwise  negative except as noted above.  Physical Exam    VS:  Ht _0  (1.575 m)   Wt 156 lb (70.8 kg)   BMI 28.53 kg/m  , BMI Body mass index is 28.53 kg/m. GEN: Well nourished, well developed, in no acute distress. HEENT: normal. Neck: Supple, no JVD, carotid bruits, or masses. Cardiac: RRR, no murmurs, rubs, or gallops. No clubbing, cyanosis, edema.  Radials/DP/PT 2+ and equal bilaterally.  Respiratory:  Respirations regular and unlabored, clear to auscultation bilaterally. GI: Soft, nontender, nondistended, BS + x 4. MS: No deformity or atrophy. Skin: Warm and dry, no rash. Neuro:  Strength and sensation are intact. Psych: Normal affect. Anxious.  Assessment & Plan    1. CAD involving native coronary arteries with chronic angina -Continue stable anginal symptoms. Continue anti-anginal regimen including Amlodipine and Imdur as well as PRN nitroglycerin. Will schedule her next appt with Dr. Fletcher Anon only as she is very interested in pursuing high risk PCI of LAD/diagonal bifurcation. However, she was recently started on anticoagulation due to VQ scan suggestive of RUL PE, as below.  2. HFrEF secondary to ICM with subsequent normalization of LVEF -Euvolemic and well compensated on exam. Echo 11/06/19 LVEF 60-65%. GDMT includes Coreg, Imdur. No indication for diuretic at this time.  3. PE - Recent admission with elevated d-dimer and VQ scan suggestive of RUL PE. Echo 11/06/19 LVEF 60-65%, no RWMA, mild LVH, gr1DD, RV mildly enlarged, bilateral atria mildly dilated. CT deferred to protect renal function. Likely caused by prior hospitalization. Tolerating Eliquis without bleeding complications.  4. Syncope - Monitor 03/06/19 with predominantly NSR and 1 run of VT 5 beats rate 179bpm and 3 runs of  SVT fastest 5 beats at 148bpm and longest 7 beats at rate 107 bpm. She had rare PVC/PAC. Echo 11/06/19 Recent syncope likely in setting of PE.   5. CKD 3 -Careful titration of diuretics and antihypertensives.  6. HTN/hypotension - BP low normal today. Some lightheadedness with position changes - her Imdur, Amlodipine as well as her psychiatric medications are contributory. As she is tolerating mild lightheadedness, encourage continued adequate hydration and slow position changes.   7. HLD - Continue Crestor 57m daily.   Disposition: Follow up 12/07/19 with Dr. AFletcher Anonfor discussion regarding high risk PCI - appt with Dr. AFletcher Anononly.  CLoel Dubonnet NP 11/16/2019, 11:01 AM

## 2019-11-17 DIAGNOSIS — E1142 Type 2 diabetes mellitus with diabetic polyneuropathy: Secondary | ICD-10-CM | POA: Diagnosis not present

## 2019-11-17 DIAGNOSIS — I252 Old myocardial infarction: Secondary | ICD-10-CM | POA: Diagnosis not present

## 2019-11-17 DIAGNOSIS — R55 Syncope and collapse: Secondary | ICD-10-CM | POA: Diagnosis not present

## 2019-11-17 DIAGNOSIS — I25118 Atherosclerotic heart disease of native coronary artery with other forms of angina pectoris: Secondary | ICD-10-CM | POA: Diagnosis not present

## 2019-11-17 DIAGNOSIS — I2699 Other pulmonary embolism without acute cor pulmonale: Secondary | ICD-10-CM | POA: Diagnosis not present

## 2019-11-17 DIAGNOSIS — F410 Panic disorder [episodic paroxysmal anxiety] without agoraphobia: Secondary | ICD-10-CM | POA: Diagnosis not present

## 2019-11-17 DIAGNOSIS — E11649 Type 2 diabetes mellitus with hypoglycemia without coma: Secondary | ICD-10-CM | POA: Diagnosis not present

## 2019-11-17 DIAGNOSIS — F411 Generalized anxiety disorder: Secondary | ICD-10-CM | POA: Diagnosis not present

## 2019-11-17 DIAGNOSIS — F314 Bipolar disorder, current episode depressed, severe, without psychotic features: Secondary | ICD-10-CM | POA: Diagnosis not present

## 2019-11-18 ENCOUNTER — Emergency Department: Payer: Medicare Other

## 2019-11-18 ENCOUNTER — Other Ambulatory Visit: Payer: Self-pay

## 2019-11-18 ENCOUNTER — Emergency Department
Admission: EM | Admit: 2019-11-18 | Discharge: 2019-11-18 | Disposition: A | Discharge status: Home / Self Care | Payer: Medicare Other | Attending: Student in an Organized Health Care Education/Training Program | Admitting: Student in an Organized Health Care Education/Training Program

## 2019-11-18 ENCOUNTER — Encounter: Payer: Self-pay | Admitting: Emergency Medicine

## 2019-11-18 DIAGNOSIS — I5022 Chronic systolic (congestive) heart failure: Secondary | ICD-10-CM | POA: Insufficient documentation

## 2019-11-18 DIAGNOSIS — R079 Chest pain, unspecified: Secondary | ICD-10-CM | POA: Diagnosis not present

## 2019-11-18 DIAGNOSIS — Z87891 Personal history of nicotine dependence: Secondary | ICD-10-CM | POA: Insufficient documentation

## 2019-11-18 DIAGNOSIS — Z7902 Long term (current) use of antithrombotics/antiplatelets: Secondary | ICD-10-CM | POA: Insufficient documentation

## 2019-11-18 DIAGNOSIS — I2510 Atherosclerotic heart disease of native coronary artery without angina pectoris: Secondary | ICD-10-CM | POA: Insufficient documentation

## 2019-11-18 DIAGNOSIS — Z79899 Other long term (current) drug therapy: Secondary | ICD-10-CM | POA: Insufficient documentation

## 2019-11-18 DIAGNOSIS — N1831 Chronic kidney disease, stage 3a: Secondary | ICD-10-CM | POA: Insufficient documentation

## 2019-11-18 DIAGNOSIS — Z8673 Personal history of transient ischemic attack (TIA), and cerebral infarction without residual deficits: Secondary | ICD-10-CM | POA: Insufficient documentation

## 2019-11-18 DIAGNOSIS — M542 Cervicalgia: Secondary | ICD-10-CM | POA: Diagnosis not present

## 2019-11-18 DIAGNOSIS — E039 Hypothyroidism, unspecified: Secondary | ICD-10-CM | POA: Diagnosis not present

## 2019-11-18 DIAGNOSIS — R55 Syncope and collapse: Secondary | ICD-10-CM | POA: Insufficient documentation

## 2019-11-18 DIAGNOSIS — Z7901 Long term (current) use of anticoagulants: Secondary | ICD-10-CM | POA: Diagnosis not present

## 2019-11-18 DIAGNOSIS — I130 Hypertensive heart and chronic kidney disease with heart failure and stage 1 through stage 4 chronic kidney disease, or unspecified chronic kidney disease: Secondary | ICD-10-CM | POA: Insufficient documentation

## 2019-11-18 DIAGNOSIS — Z951 Presence of aortocoronary bypass graft: Secondary | ICD-10-CM | POA: Diagnosis not present

## 2019-11-18 DIAGNOSIS — E1122 Type 2 diabetes mellitus with diabetic chronic kidney disease: Secondary | ICD-10-CM | POA: Insufficient documentation

## 2019-11-18 DIAGNOSIS — R4182 Altered mental status, unspecified: Secondary | ICD-10-CM | POA: Diagnosis not present

## 2019-11-18 DIAGNOSIS — J9811 Atelectasis: Secondary | ICD-10-CM | POA: Diagnosis not present

## 2019-11-18 LAB — BASIC METABOLIC PANEL
Anion gap: 10 (ref 5–15)
BUN: 25 mg/dL — ABNORMAL HIGH (ref 6–20)
CO2: 23 mmol/L (ref 22–32)
Calcium: 9.3 mg/dL (ref 8.9–10.3)
Chloride: 101 mmol/L (ref 98–111)
Creatinine, Ser: 2.24 mg/dL — ABNORMAL HIGH (ref 0.44–1.00)
GFR, Estimated: 25 mL/min — ABNORMAL LOW (ref >60)
Glucose, Bld: 273 mg/dL — ABNORMAL HIGH (ref 70–99)
Potassium: 5 mmol/L (ref 3.5–5.1)
Sodium: 134 mmol/L — ABNORMAL LOW (ref 135–145)

## 2019-11-18 LAB — CBC
HCT: 38 % (ref 36.0–46.0)
Hemoglobin: 12.1 g/dL (ref 12.0–15.0)
MCH: 26.9 pg (ref 26.0–34.0)
MCHC: 31.8 g/dL (ref 30.0–36.0)
MCV: 84.6 fL (ref 80.0–100.0)
Platelets: 237 10*3/uL (ref 150–400)
RBC: 4.49 MIL/uL (ref 3.87–5.11)
RDW: 13.9 % (ref 11.5–15.5)
WBC: 12 10*3/uL — ABNORMAL HIGH (ref 4.0–10.5)
nRBC: 0 % (ref 0.0–0.2)

## 2019-11-18 LAB — TROPONIN I (HIGH SENSITIVITY)
Troponin I (High Sensitivity): 8 ng/L (ref <18)
Troponin I (High Sensitivity): 6 ng/L (ref <18)

## 2019-11-18 MED ORDER — IOHEXOL 350 MG/ML SOLN
60.0000 mL | Freq: Once | INTRAVENOUS | Status: AC | PRN
  Administered 2019-11-18: 60 mL via INTRAVENOUS

## 2019-11-18 MED ORDER — SODIUM CHLORIDE 0.9 % IV BOLUS
1000.0000 mL | Freq: Once | INTRAVENOUS | Status: AC
  Administered 2019-11-18: 1000 mL via INTRAVENOUS

## 2019-11-18 NOTE — ED Notes (Signed)
Discharge and f/up discussed with pt, pt verbalized understanding. No distress noted. VSS.

## 2019-11-18 NOTE — ED Notes (Signed)
Pt presents to ED with c/o of neck pain that started while driving to church that pt states radiates to L shoulder. Pt states recently DX'ed with PE, pt states taking blood thinners as RX'ed. No distress noted at this time. Pt denies SOB or chest pain at this time. No distress noted.

## 2019-11-18 NOTE — ED Triage Notes (Signed)
First RN note: pt presents to ED via POV with family. Pt's family reports that patient was at lunch with them, pt them began falling asleep repeatedly, pt arrives to ED A&O x4. Pt reports neck pain that radiates down her L shoulder, reports was recently D/C and was dx with a blood clot to her lungs.

## 2019-11-18 NOTE — ED Provider Notes (Signed)
[facility] Emergency Department Provider Note ____________________________________________   First MD Initiated Contact with Patient 11/18/19 1239     (approximate)  I have reviewed the triage vital signs and the nursing notes.   HISTORY  Chief Complaint Chest Pain  HPI [name] is a 49 y.o. female with history of PE, and remote history of CVA, DM,  presents to the emergency department for treatment and evaluation after having a near syncopal episodes.  She was recently started on Eliquis for PE.  She is awaiting an appointment with Dr. Fletcher Anon in early November to discuss intervention of "blocked arteries".  She states that while driving she began to have some pain in her neck that radiated into her left jaw and felt like she was going to pass out.  She was able to make it to the restaurant where her friends and family noticed that she did not appear to be feeling well.  Currently, she is denying chest pain or pain in her left jaw or arm.  She states that it is intermittent.  She states that she has been compliant with her medications including her Eliquis.         Past Medical History:  Diagnosis Date  . Acute colitis 01/27/2017  . Acute pyelonephritis   . Anemia    iron deficiency anemia  . Aortic arch aneurysm ([place])   . BRCA negative 2014  . CAD (coronary artery disease)    a. 08/2003 Cath: LAD 30-40-med Rx; b. 11/2014 PCI: LAD 41m(3.25x23 Xience Alpine DES); c. 06/2015 PCI: D1 (2.25x12 Resolute Integrity DES); d. 06/2017 PCI: Patent mLAD stent, D2 95 (PTCA); e. 09/2017 PCI: D2 99ost (CBA); d. 12/2017 Cath: LM nl, LAD 390m80d (small), D1 40ost, D2 95ost, LCX 40p, RCA 40ost/p->Med rx for D2 given restenosis.  . Colitis 06/03/2015  . Colon polyp   . CVA (cerebral vascular accident) ([facility] [name]   Left side weakness.   . Degenerative tear of glenoid labrum of right shoulder 03/15/2017  . Diabetes mellitus without complication (HCTustin  . Family history  of breast cancer    BRCA neg 2014  . Femur fracture, left (HCSharpsburg9/10/2018  . Gastric ulcer 04/27/2011  . History of echocardiogram    a. 03/2017 Echo: EF 60-65%, no rwma; b. 02/2018 Echo: EF 60-65%, no rwma. Nl RV fxn. No cardiac source of emboli (admitted w/ stroke).  . Hypertension   . Malignant melanoma of skin of scalp (HCGrandin  . MI, acute, non ST segment elevation (HCEm[place]  . Neuromuscular disorder (HCRed [place]  . S/P drug eluting coronary stent placement 06/04/2015  . Sepsis (HCKi[place]2/14/2019  . Stroke ([facility] [place]   a. 02/2018 MRI: 21m2mate acute/early subacute L medial frontal lobe inarct; b. 02/2018 MRA No large vessel occlusion or aneurysm. Mod to sev L P2 stenosis. thready L vertebral artery, diffusely dzs'd; c. 02/2018 Carotid U/S: <50% bilat ICA dzs.    Patient Active Problem List   Diagnosis Date Noted  . Pulmonary embolism (HCCKenvir0/06/2019  . Chest pain 11/05/2019  . Type II diabetes mellitus with renal manifestations (HCCVelva0/04/2019  . Acute renal failure superimposed on stage 3a chronic kidney disease (HCCAlbany7/31/2021  . Hypoglycemia   . Family history of breast cancer 05/23/2019  . Family history of colon cancer 05/23/2019  . Abnormal LFTs 04/18/2019  . Esophageal dysphagia   . Gastrointestinal tract imaging abnormality   . Trochanteric bursitis, left hip 03/17/2019  .  Malignant neoplasm of vulva, unspecified ([place]) 03/10/2019  . Peripheral vascular disease, unspecified ([place]) 03/10/2019  . Paroxysmal supraventricular tachycardia ([place]) 03/10/2019  . Contusion of left hip 03/06/2019  . Chest pain, moderate coronary artery risk 01/27/2019  . Melena   . Recurrent chest pain 01/19/2019  . Depression, major, single episode, moderate ([place]) 12/09/2018  . Closed nondisplaced intertrochanteric fracture of left femur ([place]) 10/12/2018  . Hypotension 06/26/2018  . Autonomic neuropathy 03/24/2018  . Acute delirium 03/03/2018  . H/O gastric bypass 03/02/2018  . Hypertension associated with stage  3 chronic kidney disease due to type 2 diabetes mellitus ([place]) 02/20/2018  . SI (sacroiliac) joint dysfunction 12/02/2017  . Effort angina ([place]) 06/29/2017  . Unstable angina ([place]) 06/24/2017  . Syncope 04/09/2017  . Insomnia 03/18/2017  . Ischemic cardiomyopathy   . Arthritis   . Anxiety   . Tendinitis of upper biceps tendon of right shoulder 03/16/2017  . Hx of colonic polyps   . H/O medication noncompliance 12/14/2015  . CAD (coronary artery disease)   . Hypertensive heart disease   . Status post bariatric surgery 06/04/2015  . Carotid stenosis 04/30/2015  . Stable angina pectoris ([place]) 04/17/2015  . Iron deficiency anemia 03/22/2015  . Vitamin B12 deficiency 02/18/2015  . Misuse of medications for pain 02/18/2015  . Major depressive disorder, recurrent, severe with psychotic features ([place]) 02/15/2015  . Helicobacter pylori infection 11/23/2014  . Hemiparesis, left ([place]) 11/23/2014  . Malignant melanoma ([place]) 08/25/2014  . Chronic systolic CHF (congestive heart failure) ([place])   . Incomplete bladder emptying 07/12/2014  . Hypothyroidism 12/30/2013  . Aberrant subclavian artery 11/17/2013  . Multiple sclerosis ([place]) 11/02/2013  . History of CVA with residual deficit 06/20/2013  . Headache, migraine 05/29/2013  . Hyperlipidemia   . GERD (gastroesophageal reflux disease)   . Neuropathy ([place]) 01/02/2011  . Stroke ([place]) 06/21/2008  . Depression with anxiety 05/01/2008  . Essential hypertension 05/01/2008    Past Surgical History:  Procedure Laterality Date  . APPENDECTOMY    . CARDIAC CATHETERIZATION N/A 11/09/2014   Procedure: Coronary Angiography;  Surgeon: Minna Merritts, MD;  Location: [place] [facility];  Service: Cardiovascular;  Laterality: N/A;  . CARDIAC CATHETERIZATION N/A 11/12/2014   Procedure: Coronary Stent Intervention;  Surgeon: Isaias Cowman, MD;  Location: [place] CV LAB;  Service: Cardiovascular;  Laterality: N/A;  . CARDIAC CATHETERIZATION  N/A 04/18/2015   Procedure: Left Heart Cath and Coronary Angiography;  Surgeon: Minna Merritts, MD;  Location: [place] [facility] LAB;  Service: Cardiovascular;  Laterality: N/A;  . CARDIAC CATHETERIZATION Left 06/04/2015   Procedure: Left Heart Cath and Coronary Angiography;  Surgeon: Wellington Hampshire, MD;  Location: [place] CV LAB;  Service: Cardiovascular;  Laterality: Left;  . CARDIAC CATHETERIZATION N/A 06/04/2015   Procedure: Coronary Stent Intervention;  Surgeon: Wellington Hampshire, MD;  Location: [place] CV LAB;  Service: Cardiovascular;  Laterality: N/A;  . CESAREAN SECTION  2001  . CHOLECYSTECTOMY N/A 11/18/2016   Procedure: LAPAROSCOPIC CHOLECYSTECTOMY WITH INTRAOPERATIVE CHOLANGIOGRAM;  Surgeon: Christene Lye, MD;  Location: [facility];  Service: General;  Laterality: N/A;  . COLONOSCOPY WITH PROPOFOL N/A 04/27/2016   Procedure: COLONOSCOPY WITH PROPOFOL;  Surgeon: [name], MD;  Location: [place];  Service: Endoscopy;  Laterality: N/A;  . COLONOSCOPY WITH PROPOFOL N/A 01/12/2018   Procedure: COLONOSCOPY WITH PROPOFOL;  Surgeon: Toledo, Benay Pike, MD;  Location: [facility];  Service: Endoscopy;  Laterality: N/A;  . CORONARY ANGIOPLASTY    .  CORONARY BALLOON ANGIOPLASTY N/A 06/29/2017   Procedure: CORONARY BALLOON ANGIOPLASTY;  Surgeon: Wellington Hampshire, MD;  Location: [place] [facility] LAB;  Service: Cardiovascular;  Laterality: N/A;  . CORONARY BALLOON ANGIOPLASTY N/A 09/20/2017   Procedure: CORONARY BALLOON ANGIOPLASTY;  Surgeon: Wellington Hampshire, MD;  Location: [place] [facility];  Service: Cardiovascular;  Laterality: N/A;  . DILATION AND CURETTAGE OF UTERUS    . ESOPHAGOGASTRODUODENOSCOPY (EGD) WITH PROPOFOL N/A 09/14/2014   Procedure: ESOPHAGOGASTRODUODENOSCOPY (EGD) WITH PROPOFOL;  Surgeon: Josefine Class, MD;  [facility];  Service: Endoscopy;  Laterality: N/A;  . ESOPHAGOGASTRODUODENOSCOPY (EGD) WITH PROPOFOL N/A 04/27/2016    Procedure: ESOPHAGOGASTRODUODENOSCOPY (EGD) WITH PROPOFOL;  Surgeon: [name], MD;  Location: [place];  Service: Endoscopy;  Laterality: N/A;  Diabetic - oral meds  . ESOPHAGOGASTRODUODENOSCOPY (EGD) WITH PROPOFOL N/A 01/12/2018   Procedure: ESOPHAGOGASTRODUODENOSCOPY (EGD) WITH PROPOFOL;  Surgeon: Toledo, Benay Pike, MD;  Location: [facility];  Service: Endoscopy;  Laterality: N/A;  . ESOPHAGOGASTRODUODENOSCOPY (EGD) WITH PROPOFOL N/A 04/11/2019   Procedure: ESOPHAGOGASTRODUODENOSCOPY (EGD) WITH PROPOFOL;  Surgeon: [name], MD;  Location: [facility]OS[facility];  Service: Endoscopy;  Laterality: N/A;  . GASTRIC BYPASS  09/2009   [place] (IM) NAIL INTERTROCHANTERIC Left 10/13/2018   Procedure: INTRAMEDULLARY (IM) NAIL INTERTROCHANTRIC;  Surgeon: Leandrew Koyanagi, MD;  Location: [place];  Service: Orthopedics;  Laterality: Left;  . Left Carotid to sublcavian artery bypass w/ subclavian artery ligation     a. Performed @ [place].  . LEFT HEART CATH AND CORONARY ANGIOGRAPHY Left 06/29/2017   Procedure: LEFT HEART CATH AND CORONARY ANGIOGRAPHY;  Surgeon: Wellington Hampshire, MD;  Location: [place] [facility] LAB;  Service: Cardiovascular;  Laterality: Left;  . LEFT HEART CATH AND CORONARY ANGIOGRAPHY N/A 09/20/2017   Procedure: LEFT HEART CATH AND CORONARY ANGIOGRAPHY;  Surgeon: Wellington Hampshire, MD;  Location: [place] [facility] LAB;  Service: Cardiovascular;  Laterality: N/A;  . LEFT HEART CATH AND CORONARY ANGIOGRAPHY N/A 12/20/2017   Procedure: LEFT HEART CATH AND CORONARY ANGIOGRAPHY;  Surgeon: Wellington Hampshire, MD;  Location: [place] [facility];  Service: Cardiovascular;  Laterality: N/A;  . LEFT HEART CATH AND CORONARY ANGIOGRAPHY N/A 04/20/2019   Procedure: LEFT HEART CATH AND CORONARY ANGIOGRAPHY possible PCI;  Surgeon: Minna Merritts, MD;  Location: [place] [facility];  Service: Cardiovascular;  Laterality: N/A;  . MELANOMA EXCISION  2016   Dr.  Evorn Gong  . [place]  2002  . RIGHT OOPHORECTOMY    . SHOULDER ARTHROSCOPY WITH OPEN ROTATOR CUFF REPAIR Right 01/07/2016   Procedure: SHOULDER ARTHROSCOPY WITH DEBRIDMENT, SUBACHROMIAL DECOMPRESSION;  Surgeon: Corky Mull, MD;  Location: [facility];  Service: Orthopedics;  Laterality: Right;  . SHOULDER ARTHROSCOPY WITH OPEN ROTATOR CUFF REPAIR Right 03/16/2017   Procedure: SHOULDER ARTHROSCOPY WITH OPEN ROTATOR CUFF REPAIR POSSIBLE BICEPS TENODESIS;  Surgeon: Corky Mull, MD;  Location: [facility];  Service: Orthopedics;  Laterality: Right;  . TRIGGER FINGER RELEASE Right     [place]    Prior to Admission medications   Medication Sig Start Date End Date Taking? Authorizing Provider  ALPRAZolam Duanne Moron) 1 MG tablet Take 1 tablet by mouth twice daily as needed Patient taking differently: Take 1 mg by mouth 2 (two) times daily as needed for anxiety.  08/14/19   [name], MD  amLODipine (NORVASC) 2.5 MG tablet Take 1 tablet (2.5 mg total) by mouth daily. Patient taking differently: Take 2.5  mg by mouth every evening.  10/25/19   Gollan, Kathlene November, MD  APIXABAN Arne [place]) VTE STARTER PACK (10MG AND 5MG) Take as directed on package: start with two-39m tablets twice daily for 7 days. On day 8, switch to one-549mtablet twice daily. 11/07/19   NeMariel AloeMD  buPROPion (WELLBUTRIN XL) 300 MG 24 hr tablet Take 1 tablet by mouth once daily Patient taking differently: Take 300 mg by mouth daily.  10/21/19   FiBirdie SonsMD  carvedilol (COREG) 12.5 MG tablet Take 1 tablet (12.5 mg total) by mouth 2 (two) times daily with a meal. 10/20/19   GiDwyane DeeMD  clopidogrel (PLAVIX) 75 MG tablet Take 75 mg by mouth daily.    [provider]  EUTHYROX 25 MCG tablet TAKE 1 TABLET BY MOUTH ONCE DAILY BEFORE BREAKFAST Patient taking differently: Take 25 mcg by mouth daily before breakfast.  10/27/19   FiBirdie SonsMD  FIBER COMPLETE PO Take 5 mg by mouth.    [provider]  gabapentin (NEURONTIN) 300 MG capsule Take 1 capsule (300 mg total) by mouth 2 (two) times daily. 10/12/18   FiBirdie SonsMD  isosorbide mononitrate (IMDUR) 120 MG 24 hr tablet Take 1 tablet (120 mg total) by mouth daily. 10/21/19   GiDwyane DeeMD  Multiple Vitamin (MULTIVITAMIN) capsule Take 1 capsule by mouth daily. gummies    [provider]  nitroGLYCERIN (NITROSTAT) 0.4 MG SL tablet Place 1 tablet (0.4 mg total) under the tongue every 5 (five) minutes as needed for chest pain. 10/20/19   GiDwyane DeeMD  pantoprazole (PROTONIX) 40 MG tablet Take 1 tablet by mouth twice daily Patient taking differently: Take 40 mg by mouth 2 (two) times daily.  06/06/19   FiBirdie SonsMD  promethazine (PHENERGAN) 25 MG tablet Take 25 mg by mouth every 6 (six) hours as needed for nausea or vomiting.  07/25/19   [provider]  Psyllium (METAMUCIL FIBER PO) Take 5 g by mouth daily. gummies    [provider]  rosuvastatin (CRESTOR) 40 MG tablet Take 1 tablet (40 mg total) by mouth daily at 6 PM. 10/25/19   Gollan, TiKathlene NovemberMD  traZODone (DESYREL) 100 MG tablet TAKE 1 TO 2 TABLETS BY MOUTH AT BEDTIME 11/15/19   FiBirdie SonsMD  UBRELVY 100 MG TABS Take 1 tablet by mouth 2 (two) times daily as needed (migraine).  09/13/19   [provider]  venlafaxine XR (EFFEXOR-XR) 150 MG 24 hr capsule Take 1 capsule (150 mg total) by mouth daily with breakfast. 10/23/19   FiBirdie SonsMD    Allergies Lipitor [atorvastatin] and Tramadol  Family History  Problem Relation Age of Onset  . Hypertension Mother   . Anxiety disorder Mother   . Depression Mother   . Bipolar disorder Mother   . Heart disease Mother        No details  . Hyperlipidemia Mother   . Kidney disease Father   . Heart disease Father 4069. Hypertension Father   . Diabetes Father   . Stroke Father   . Colon cancer Father        dx in his 4099's. Anxiety disorder Father   .  Depression Father   . Skin cancer Father   . Kidney disease Sister   . Thyroid nodules Sister   . Hypertension Sister   . Hypertension Sister   . Diabetes Sister   .  Hyperlipidemia Sister   . Depression Sister   . Breast cancer Maternal Aunt 30  . Breast cancer Maternal Aunt 51  . Ovarian cancer Cousin   . Colon cancer Cousin   . Kidney cancer Cousin   . Breast cancer Other   . Bladder Cancer Neg Hx     Social History Social History   Tobacco Use  . Smoking status: Former Smoker    Types: Cigarettes    Quit date: 08/31/1994    Years since quitting: 25.2  . Smokeless tobacco: Never Used  . Tobacco comment: quit 28 years ago  Vaping Use  . Vaping Use: Never used  Substance Use Topics  . Alcohol use: No    Alcohol/week: 0.0 standard drinks  . Drug use: No    Review of Systems  Constitutional: No fever/chills Eyes: No visual changes. ENT: No sore throat. Cardiovascular: Currently denies chest pain. Respiratory: Currently denies shortness of breath. Gastrointestinal: No abdominal pain.  No nausea, no vomiting.  No diarrhea.  No constipation. Genitourinary: Negative for dysuria. Musculoskeletal: Negative for back pain. Skin: Negative for rash. Neurological: Negative for headaches, focal weakness or numbness.  ____________________________________________   PHYSICAL EXAM:  VITAL SIGNS: ED Triage Vitals  Enc Vitals Group     BP 11/18/19 1222 (!) 85/60     Pulse Rate 11/18/19 1222 71     Resp 11/18/19 1222 15     Temp 11/18/19 1222 97.8 F (36.6 C)     Temp Source 11/18/19 1222 Oral     SpO2 11/18/19 1222 100 %     Weight 11/18/19 1223 156 lb 1.4 oz (70.8 kg)     Height 11/18/19 1223 5' 2" (1.575 m)     Head Circumference --      Peak Flow --      Pain Score 11/18/19 1223 10     Pain Loc --      Pain Edu? --      Excl. in [place]? --     Constitutional: Alert and oriented. Well appearing and in no acute distress. Eyes: Conjunctivae are normal. PERRL.  EOMI. Head: Atraumatic. Nose: No congestion/rhinnorhea.  Foreign symptoms he had Mouth/Throat: Mucous membranes are moist.  Oropharynx non-erythematous. Neck: No stridor.   Hematological/Lymphatic/Immunilogical: No cervical lymphadenopathy. Cardiovascular: Normal rate, regular rhythm. Grossly normal heart sounds.  Good peripheral circulation. Respiratory: Normal respiratory effort.  No retractions. Lungs CTAB. Gastrointestinal: Soft and nontender. No distention. No abdominal bruits. No CVA tenderness. Genitourinary:  Musculoskeletal: No lower extremity tenderness nor edema.  No joint effusions. Neurologic:  Normal speech and language. No gross focal neurologic deficits are appreciated. No gait instability. Skin:  Skin is warm, dry and intact. No rash noted. Psychiatric: Mood and affect are normal. Speech and behavior are normal.  ____________________________________________   LABS (all labs ordered are listed, but only abnormal results are displayed)  Labs Reviewed  BASIC METABOLIC PANEL - Abnormal; Notable for the following components:      Result Value   Sodium 134 (*)    Glucose, Bld 273 (*)    BUN 25 (*)    Creatinine, Ser 2.24 (*)    GFR, Estimated 25 (*)    All other components within normal limits  CBC - Abnormal; Notable for the following components:   WBC 12.0 (*)    All other components within normal limits  POC URINE PREG, ED  TROPONIN I (HIGH SENSITIVITY)  TROPONIN I (HIGH SENSITIVITY)   ____________________________________________  EKG  ED ECG REPORT  I, Sherrie George, FNP-BC personally viewed and interpreted this ECG.   Date: 11/18/2019  EKG Time: 1227  Rate: 76  Rhythm: normal EKG, normal sinus rhythm  Axis: normal  Intervals:none  ST&T Change: no ST elevation  ____________________________________________  RADIOLOGY  ED MD interpretation:    CT head without contrast is negative for acute intracranial abnormalities per radiology.  Chest x-ray  negative for acute cardiopulmonary abnormality.  CTA chest is negative for PE.  Patchy area right upper lobe.  I, Sherrie George, personally viewed and evaluated these images (plain radiographs) as part of my medical decision making, as well as reviewing the written report by the radiologist.  Official radiology report(s): DG Chest 1 View  Result Date: 11/18/2019 CLINICAL DATA:  Sleepiness.  Neck pain. EXAM: CHEST  1 VIEW COMPARISON:  November 05, 2019 FINDINGS: The heart size and mediastinal contours are within normal limits. Both lungs are clear. The visualized skeletal structures are unremarkable. IMPRESSION: No active disease. Electronically Signed   By: Dorise Bullion III M.D   On: 11/18/2019 13:11   CT Head Wo Contrast  Result Date: 11/18/2019 CLINICAL DATA:  Altered mental status EXAM: CT HEAD WITHOUT CONTRAST TECHNIQUE: Contiguous axial images were obtained from the base of the skull through the vertex without intravenous contrast. COMPARISON:  11/05/2019 FINDINGS: Brain: No evidence of acute infarction, hemorrhage, hydrocephalus, extra-axial collection or mass lesion/mass effect. Extensive periventricular and deep white matter hypodensity including lacunar infarctions of the right basal ganglia. Vascular: No hyperdense vessel or unexpected calcification. Skull: Normal. Negative for fracture or focal lesion. Sinuses/Orbits: No acute finding. Other: None. IMPRESSION: No acute intracranial pathology. Advanced small-vessel white matter disease. Electronically Signed   By: Eddie Candle M.D.   On: 11/18/2019 13:42   CT Angio Chest PE W and/or Wo Contrast  Result Date: 11/18/2019 CLINICAL DATA:  Neck pain radiating down the left shoulder. Recently discharged with diagnosis of a blood clot to her lungs. EXAM: CT ANGIOGRAPHY CHEST WITH CONTRAST TECHNIQUE: Multidetector CT imaging of the chest was performed using the standard protocol during bolus administration of intravenous contrast. Multiplanar  CT image reconstructions and MIPs were obtained to evaluate the vascular anatomy. CONTRAST:  61m OMNIPAQUE IOHEXOL 350 MG/ML SOLN COMPARISON:  Nuclear medicine perfusion study dated 11/06/2019. Current chest radiograph. Prior chest CT, 01/19/2019. FINDINGS: Cardiovascular: Satisfactory opacification of the pulmonary arteries to the segmental level. No evidence of pulmonary embolism. Heart is normal in size and configuration. No pericardial effusion. Left coronary artery calcifications. Great vessels are normal in caliber. There is a protrusion from the right aspect of the distal aortic arch. The aortic arch remains on the right. The descending thoracic aorta also is on the right. These findings are stable from the prior CT there consistent with a partial or incomplete vascular ring, a developmental anomaly. There is mild distension of the esophagus above this. Mediastinum/Nodes: No neck base, axillary, mediastinal or hilar masses or enlarged lymph nodes. Surgical clips in the left superior mediastinum, stable. Lungs/Pleura: Small area of patchy airspace opacity in the posterolateral right upper lobe new since the prior CT. Minimal dependent linear atelectasis in the lower lobes. Remainder of the lungs is clear. No pleural effusion or pneumothorax. Upper Abdomen: No acute findings. Stable changes from previous gastric/hiatal hernia surgery. Musculoskeletal: No fracture or acute finding.  No bone lesion. Review of the MIP images confirms the above findings. IMPRESSION: 1. No evidence of a pulmonary embolism. Specifically, no embolus is noted to the right upper lobe to correspond  to the perfusion defect noted on the recent pulmonary perfusion exam. 2. Small patchy area of airspace opacity in the posterolateral right upper lobe. This is consistent with infection in the proper clinical setting, but could be due to atelectasis. 3. No other evidence of acute cardiopulmonary disease. 4. 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 Adjacent vascular clips are consistent with previous surgical treatment for this anomaly. These findings are stable from the prior chest CT. 5. Left coronary artery calcifications. Electronically Signed   By: Lajean Manes M.D.   On: 11/18/2019 17:14    ____________________________________________   PROCEDURES  Procedure(s) performed (including Critical Care):  Procedures  ____________________________________________   INITIAL IMPRESSION / ASSESSMENT AND PLAN   49 year old female presenting to the emergency department for symptoms as described in the HPI.  She has complicated past medical history which includes a recent diagnosis of pulmonary embolus and she is currently taking Eliquis.  As I am talking with her she appears to doze off but easily awakens to her name or a light tap on the shoulder.  She states that she is just generally sleepy.  She denies injury.  She states that this started happening intermittently after she was discharged.  She has had 2 or 3 separate episodes over the past 10 days or so.  She currently denies any chest pain or shortness of breath but states that she has had that intermittently today.  Plan will be to get labs including a troponin and a CT of her head.   DIFFERENTIAL DIAGNOSIS  PE, intracranial pathology, MI, aortic injury  ED COURSE  Chart from most recent admission reviewed as well as imaging studies.  She had a VQ scan on 11/05/2019 showed a right upper lobe pulmonary embolism.  She also had an MRI of the brain without contrast which did not reveal any acute intracranial abnormality.  Clinical Course as of Nov 18 1738  Sat Nov 18, 2019  1434 On review of the patient's presentation and worsening symptoms with near syncopal and possibly syncopal episodes recently started on Eliquis for PE I do believe that we need to evaluate for acute aortic pathology versus failing anticoagulation and  worsening PE burden.  Will give IV hydration.  I recognize that she has borderline renal function but given the risk-benefit scenario I do feel that it is in her best interest to proceed with CTA to evaluate for life-threatening illness we will continue to IV hydrate to reduce risk of and worsening renal dysfunction.   [PR]    Clinical Course User Index [PR] Merlyn Lot, MD   ----------------------------------------- 4:31 PM on 11/18/2019 -----------------------------------------  Patient resting comfortably and states that she feels better than when she came in.  Blood pressure is stable at 121/86.  Awaiting CTA.  ----------------------------------------- 5:38 PM on 11/18/2019 -----------------------------------------  CTA is reassuring.  No PE or other concerning findings.  There is a patchy area in the right upper lung space however the patient has not experiencing any cough or fever and her O2 sat is 100% on room air.  This is not likely infectious.  Patient states that she feels "great."  She states that she feels ready to go home.  She was encouraged to call her primary care provider and schedule a follow-up appointment.  She was also advised to continue taking her medications as prescribed including the Eliquis.  She is to return to the emergency department for symptoms of concern if unable  to see primary care or her cardiologist. ___________________________________________   FINAL CLINICAL IMPRESSION(S) / ED DIAGNOSES  Final diagnoses:  Near syncope  Neck pain, acute     ED Discharge Orders    None       Sindee Marita [place] [name] was evaluated in Emergency Department on 11/18/2019 for the symptoms described in the history of present illness. She was evaluated in the context of the global COVID-19 pandemic, which necessitated consideration that the patient might be at risk for infection with the SARS-CoV-2 virus that causes COVID-19. Institutional protocols and algorithms  that pertain to the evaluation of patients at risk for COVID-19 are in a state of rapid change based on information released by regulatory bodies including the CDC and federal and state organizations. These policies and algorithms were followed during the patient's care in the ED.   Note:  This document was prepared using Dragon voice recognition software and may include unintentional dictation errors.   Victorino Dike, FNP 11/18/19 1740    Merlyn Lot, MD 11/19/19 [phone]

## 2019-11-18 NOTE — ED Triage Notes (Signed)
Pt arrived to ED via POV with family. Pt states she was at lunch, pt began falling asleep repeatedly.  Pt states she began having neck pain that radiated down L shoulder.   Pt recently d/c with dx of R pulmonary embolism.   Pt pain 10/10 at this time.

## 2019-11-18 NOTE — Discharge Instructions (Signed)
Please call and schedule a follow-up appointment with your primary care provider.  Continue your medications as prescribed including the Eliquis.  Return to the emergency department for symptoms of concern if you are unable to see primary care.

## 2019-11-18 NOTE — ED Notes (Signed)
Pt at CT, 20G IV placed in L [facility] for CTA purposes.

## 2019-11-20 DIAGNOSIS — E11649 Type 2 diabetes mellitus with hypoglycemia without coma: Secondary | ICD-10-CM | POA: Diagnosis not present

## 2019-11-20 DIAGNOSIS — E1142 Type 2 diabetes mellitus with diabetic polyneuropathy: Secondary | ICD-10-CM | POA: Diagnosis not present

## 2019-11-20 DIAGNOSIS — I252 Old myocardial infarction: Secondary | ICD-10-CM | POA: Diagnosis not present

## 2019-11-20 DIAGNOSIS — I25118 Atherosclerotic heart disease of native coronary artery with other forms of angina pectoris: Secondary | ICD-10-CM | POA: Diagnosis not present

## 2019-11-20 DIAGNOSIS — R55 Syncope and collapse: Secondary | ICD-10-CM | POA: Diagnosis not present

## 2019-11-20 DIAGNOSIS — I2699 Other pulmonary embolism without acute cor pulmonale: Secondary | ICD-10-CM | POA: Diagnosis not present

## 2019-11-21 NOTE — Telephone Encounter (Signed)
Verbal orders given  

## 2019-11-22 ENCOUNTER — Inpatient Hospital Stay: Payer: BC Managed Care – PPO | Admitting: Family Medicine

## 2019-11-23 ENCOUNTER — Other Ambulatory Visit: Payer: Self-pay | Admitting: Cardiovascular Disease

## 2019-11-23 ENCOUNTER — Encounter: Payer: Self-pay | Admitting: Family Medicine

## 2019-11-23 DIAGNOSIS — I2699 Other pulmonary embolism without acute cor pulmonale: Secondary | ICD-10-CM

## 2019-11-23 MED ORDER — APIXABAN 5 MG PO TABS: 5.0000 mg | ORAL_TABLET | Freq: Two times a day (BID) | ORAL | 1 refills | Status: DC

## 2019-11-23 MED ORDER — APIXABAN 5 MG PO TABS: 5.0000 mg | ORAL_TABLET | Freq: Two times a day (BID) | ORAL | 4 refills | Status: DC

## 2019-11-24 DIAGNOSIS — I2699 Other pulmonary embolism without acute cor pulmonale: Secondary | ICD-10-CM | POA: Diagnosis not present

## 2019-11-24 DIAGNOSIS — I25118 Atherosclerotic heart disease of native coronary artery with other forms of angina pectoris: Secondary | ICD-10-CM | POA: Diagnosis not present

## 2019-11-24 DIAGNOSIS — E1142 Type 2 diabetes mellitus with diabetic polyneuropathy: Secondary | ICD-10-CM | POA: Diagnosis not present

## 2019-11-24 DIAGNOSIS — E11649 Type 2 diabetes mellitus with hypoglycemia without coma: Secondary | ICD-10-CM | POA: Diagnosis not present

## 2019-11-24 DIAGNOSIS — R55 Syncope and collapse: Secondary | ICD-10-CM | POA: Diagnosis not present

## 2019-11-24 DIAGNOSIS — I252 Old myocardial infarction: Secondary | ICD-10-CM | POA: Diagnosis not present

## 2019-11-27 ENCOUNTER — Other Ambulatory Visit: Payer: Self-pay

## 2019-11-27 ENCOUNTER — Encounter: Payer: Self-pay | Admitting: *Deleted

## 2019-11-27 ENCOUNTER — Ambulatory Visit
Admission: EM | Admit: 2019-11-27 | Discharge: 2019-11-27 | Disposition: A | Discharge status: Home / Self Care | Payer: Medicare Other | Attending: Family Medicine | Admitting: Family Medicine

## 2019-11-27 ENCOUNTER — Ambulatory Visit (INDEPENDENT_AMBULATORY_CARE_PROVIDER_SITE_OTHER): Payer: Medicare Other

## 2019-11-27 DIAGNOSIS — T148XXA Other injury of unspecified body region, initial encounter: Secondary | ICD-10-CM | POA: Diagnosis not present

## 2019-11-27 DIAGNOSIS — M25562 Pain in left knee: Secondary | ICD-10-CM

## 2019-11-27 DIAGNOSIS — M25522 Pain in left elbow: Secondary | ICD-10-CM | POA: Diagnosis not present

## 2019-11-27 DIAGNOSIS — W19XXXA Unspecified fall, initial encounter: Secondary | ICD-10-CM

## 2019-11-27 NOTE — ED Triage Notes (Signed)
Patient reports falling out of bed 2 nights ago--landing on left side. Patient noted bruising to face, left elbow skin tear, and left knee. States that left skin tear keeps intermittently bleeding--on eliquis and plavix.

## 2019-11-27 NOTE — ED Provider Notes (Signed)
Roderic Palau    CSN: [id number] Arrival date & time: 11/27/19  1212      History   Chief Complaint Chief Complaint  Patient presents with  . Fall  . Arm Injury  . Facial Injury  . Knee Injury    HPI [name] is a 49 y.o. female.   Reports that she fell out of bed x 2 days ago. Reports that there is bruising to her chin, a skin tear to the L elbow, and pain, limited movement, swelling to the L knee. Is taking Eliquis for blood clot in the lungs. Has not attempted OTC treatment for this. Is concerned because the skin tear to the L elbow continues to ooze blood. Has been using pressure dressing to try to stop this. Denies headaches, LOC, changes in vision, nausea, vomiting, rash, fever, other symptoms.  ROS per HPI  The history is provided by the patient.  Fall  Arm Injury Facial Injury   Past Medical History:  Diagnosis Date  . Acute colitis 01/27/2017  . Acute pyelonephritis   . Anemia    iron deficiency anemia  . Aortic arch aneurysm ([place])   . BRCA negative 2014  . CAD (coronary artery disease)    a. 08/2003 Cath: LAD 30-40-med Rx; b. 11/2014 PCI: LAD 35m(3.25x23 Xience Alpine DES); c. 06/2015 PCI: D1 (2.25x12 Resolute Integrity DES); d. 06/2017 PCI: Patent mLAD stent, D2 95 (PTCA); e. 09/2017 PCI: D2 99ost (CBA); d. 12/2017 Cath: LM nl, LAD 3559m80d (small), D1 40ost, D2 95ost, LCX 40p, RCA 40ost/p->Med rx for D2 given restenosis.  . Colitis 06/03/2015  . Colon polyp   . CVA (cerebral vascular accident) (H[facility]au[facility] [place]   Left side weakness.   . Degenerative tear of glenoid labrum of right shoulder 03/15/2017  . Diabetes mellitus without complication ([facility]  . Family history of breast cancer    BRCA neg 2014  . Femur fracture, left (HCUnion9/10/2018  . Gastric ulcer 04/27/2011  . History of echocardiogram    a. 03/2017 Echo: EF 60-65%, no rwma; b. 02/2018 Echo: EF 60-65%, no rwma. Nl RV fxn. No cardiac source of emboli (admitted w/ stroke).  . Hypertension     . Malignant melanoma of skin of scalp (HCBuff[facility]  . MI, acute, non ST segment elevation (HCGould[facility]  . Neuromuscular disorder (HCFarm[place]  . S/P drug eluting coronary stent placement 06/04/2015  . Sepsis (HCEldridge2/14/2019  . [facility]   a. 02/2018 MRI: 59m38mate acute/early subacute L medial frontal lobe inarct; b. 02/2018 MRA No large vessel occlusion or aneurysm. Mod to sev L P2 stenosis. thready L vertebral artery, diffusely dzs'd; c. 02/2018 Carotid U/S: <50% bilat ICA dzs.    Patient Active Problem List   Diagnosis Date Noted  . Pulmonary embolism (HCCAlbany0/06/2019  . Chest pain 11/05/2019  . Type II diabetes mellitus with renal manifestations (HCCFairhope0/04/2019  . Acute renal failure superimposed on stage 3a chronic kidney disease (HCCLandisburg7/31/2021  . Hypoglycemia   . Family history of breast cancer 05/23/2019  . Family history of colon cancer 05/23/2019  . Abnormal LFTs 04/18/2019  . Esophageal dysphagia   . Gastrointestinal tract imaging abnormality   . Trochanteric bursitis, left hip 03/17/2019  . Malignant neoplasm of vulva, unspecified (HCCPinnacle2/06/2019  . Peripheral vascular disease, unspecified (HCCKaser2/06/2019  . Paroxysmal supraventricular tachycardia (HCCChili2/06/2019  . Contusion of left hip 03/06/2019  . Chest pain, moderate coronary artery risk  01/27/2019  . Melena   . Recurrent chest pain 01/19/2019  . Depression, major, single episode, moderate ([place]) 12/09/2018  . Closed nondisplaced intertrochanteric fracture of left femur ([place]) 10/12/2018  . Hypotension 06/26/2018  . Autonomic neuropathy 03/24/2018  . Acute delirium 03/03/2018  . H/O gastric bypass 03/02/2018  . Hypertension associated with stage 3 chronic kidney disease due to type 2 diabetes mellitus ([place]) 02/20/2018  . SI (sacroiliac) joint dysfunction 12/02/2017  . Unstable angina ([place]) 06/24/2017  . Syncope 04/09/2017  . Insomnia 03/18/2017  . Ischemic cardiomyopathy   . Arthritis   . Anxiety   . Tendinitis of upper  biceps tendon of right shoulder 03/16/2017  . Hx of colonic polyps   . H/O medication noncompliance 12/14/2015  . CAD (coronary artery disease)   . Hypertensive heart disease   . Status post bariatric surgery 06/04/2015  . Carotid stenosis 04/30/2015  . Stable angina pectoris ([place]) 04/17/2015  . Iron deficiency anemia 03/22/2015  . Vitamin B12 deficiency 02/18/2015  . Misuse of medications for pain 02/18/2015  . Major depressive disorder, recurrent, severe with psychotic features ([place]) 02/15/2015  . Helicobacter pylori infection 11/23/2014  . Hemiparesis, left ([place]) 11/23/2014  . Malignant melanoma ([place]) 08/25/2014  . Chronic systolic CHF (congestive heart failure) ([place])   . Incomplete bladder emptying 07/12/2014  . Hypothyroidism 12/30/2013  . Aberrant subclavian artery 11/17/2013  . Multiple sclerosis ([place]) 11/02/2013  . History of CVA with residual deficit 06/20/2013  . Headache, migraine 05/29/2013  . Hyperlipidemia   . GERD (gastroesophageal reflux disease)   . Neuropathy ([place]) 01/02/2011  . Stroke ([place]) 06/21/2008  . Depression with anxiety 05/01/2008  . Essential hypertension 05/01/2008    Past Surgical History:  Procedure Laterality Date  . APPENDECTOMY    . CARDIAC CATHETERIZATION N/A 11/09/2014   Procedure: Coronary Angiography;  Surgeon: Minna Merritts, MD;  Location: [place] CV LAB;  Service: Cardiovascular;  Laterality: N/A;  . CARDIAC CATHETERIZATION N/A 11/12/2014   Procedure: Coronary Stent Intervention;  Surgeon: Isaias Cowman, MD;  Location: [place] CV LAB;  Service: Cardiovascular;  Laterality: N/A;  . CARDIAC CATHETERIZATION N/A 04/18/2015   Procedure: Left Heart Cath and Coronary Angiography;  Surgeon: Minna Merritts, MD;  Location: [place] [facility];  Service: Cardiovascular;  Laterality: N/A;  . CARDIAC CATHETERIZATION Left 06/04/2015   Procedure: Left Heart Cath and Coronary Angiography;  Surgeon: Wellington Hampshire, MD;  Location:  [place] CV LAB;  Service: Cardiovascular;  Laterality: Left;  . CARDIAC CATHETERIZATION N/A 06/04/2015   Procedure: Coronary Stent Intervention;  Surgeon: Wellington Hampshire, MD;  Location: [place] CV LAB;  Service: Cardiovascular;  Laterality: N/A;  . CESAREAN SECTION  2001  . CHOLECYSTECTOMY N/A 11/18/2016   Procedure: LAPAROSCOPIC CHOLECYSTECTOMY WITH INTRAOPERATIVE CHOLANGIOGRAM;  Surgeon: Christene Lye, MD;  Location: [facility];  Service: General;  Laterality: N/A;  . COLONOSCOPY WITH PROPOFOL N/A 04/27/2016   Procedure: COLONOSCOPY WITH PROPOFOL;  Surgeon: [name], MD;  Location: [place];  Service: Endoscopy;  Laterality: N/A;  . COLONOSCOPY WITH PROPOFOL N/A 01/12/2018   Procedure: COLONOSCOPY WITH PROPOFOL;  Surgeon: Tol[place], Benay Pike, MD;  Location: [facility] END[facility];  Service: Endoscopy;  Laterality: N/A;  . CORONARY ANGIOPLASTY    . CORONARY BALLOON ANGIOPLASTY N/A 06/29/2017   Procedure: CORONARY BALLOON ANGIOPLASTY;  Surgeon: Wellington Hampshire, MD;  Location: [place] [facility] LAB;  Service: Cardiovascular;  Laterality: N/A;  . CORONARY BALLOON ANGIOPLASTY N/A 09/20/2017   Procedure: CORONARY BALLOON  ANGIOPLASTY;  Surgeon: Wellington Hampshire, MD;  Location: [place] CV LAB;  Service: Cardiovascular;  Laterality: N/A;  . DILATION AND CURETTAGE OF UTERUS    . ESOPHAGOGASTRODUODENOSCOPY (EGD) WITH PROPOFOL N/A 09/14/2014   Procedure: ESOPHAGOGASTRODUODENOSCOPY (EGD) WITH PROPOFOL;  Surgeon: Josefine Class, MD;  [facility];  Service: Endoscopy;  Laterality: N/A;  . ESOPHAGOGASTRODUODENOSCOPY (EGD) WITH PROPOFOL N/A 04/27/2016   Procedure: ESOPHAGOGASTRODUODENOSCOPY (EGD) WITH PROPOFOL;  Surgeon: [name], MD;  Location: [place];  Service: Endoscopy;  Laterality: N/A;  Diabetic - oral meds  . ESOPHAGOGASTRODUODENOSCOPY (EGD) WITH PROPOFOL N/A 01/12/2018   Procedure: ESOPHAGOGASTRODUODENOSCOPY (EGD) WITH PROPOFOL;  Surgeon:  [place], Benay Pike, MD;  Location: [facility];  Service: Endoscopy;  Laterality: N/A;  . ESOPHAGOGASTRODUODENOSCOPY (EGD) WITH PROPOFOL N/A 04/11/2019   Procedure: ESOPHAGOGASTRODUODENOSCOPY (EGD) WITH PROPOFOL;  Surgeon: [name], MD;  Location: [facility];  Service: Endoscopy;  Laterality: N/A;  . GASTRIC BYPASS  09/2009   [place] (IM) NAIL INTERTROCHANTERIC Left 10/13/2018   Procedure: INTRAMEDULLARY (IM) NAIL INTERTROCHANTRIC;  Surgeon: Leandrew Koyanagi, MD;  Location: [place];  Service: Orthopedics;  Laterality: Left;  . Left Carotid to sublcavian artery bypass w/ subclavian artery ligation     a. Performed @ [facility].  . LEFT HEART CATH AND CORONARY ANGIOGRAPHY Left 06/29/2017   Procedure: LEFT HEART CATH AND CORONARY ANGIOGRAPHY;  Surgeon: Wellington Hampshire, MD;  Location: [place] [facility];  Service: Cardiovascular;  Laterality: Left;  . LEFT HEART CATH AND CORONARY ANGIOGRAPHY N/A 09/20/2017   Procedure: LEFT HEART CATH AND CORONARY ANGIOGRAPHY;  Surgeon: Wellington Hampshire, MD;  Location: [place] [facility] LAB;  Service: Cardiovascular;  Laterality: N/A;  . LEFT HEART CATH AND CORONARY ANGIOGRAPHY N/A 12/20/2017   Procedure: LEFT HEART CATH AND CORONARY ANGIOGRAPHY;  Surgeon: Wellington Hampshire, MD;  Location: [place] [facility] LAB;  Service: Cardiovascular;  Laterality: N/A;  . LEFT HEART CATH AND CORONARY ANGIOGRAPHY N/A 04/20/2019   Procedure: LEFT HEART CATH AND CORONARY ANGIOGRAPHY possible PCI;  Surgeon: Minna Merritts, MD;  Location: [place] CV LAB;  Service: Cardiovascular;  Laterality: N/A;  . MELANOMA EXCISION  2016   Dr. Evorn Gong  . [place]  2002  . RIGHT OOPHORECTOMY    . SHOULDER ARTHROSCOPY WITH OPEN ROTATOR CUFF REPAIR Right 01/07/2016   Procedure: SHOULDER ARTHROSCOPY WITH DEBRIDMENT, SUBACHROMIAL DECOMPRESSION;  Surgeon: Corky Mull, MD;  Location: [facility];  Service: Orthopedics;  Laterality: Right;  . SHOULDER  ARTHROSCOPY WITH OPEN ROTATOR CUFF REPAIR Right 03/16/2017   Procedure: SHOULDER ARTHROSCOPY WITH OPEN ROTATOR CUFF REPAIR POSSIBLE BICEPS TENODESIS;  Surgeon: Corky Mull, MD;  Location: [facility];  Service: Orthopedics;  Laterality: Right;  . TRIGGER FINGER RELEASE Right     Middle Finger    OB History    Gravida  7   Para  1   Term      Preterm  1   AB  6   Living  1     SAB  6   TAB      Ectopic      Multiple      Live Births  1            Home Medications    Prior to Admission medications   Medication Sig Start Date End Date Taking? Authorizing Provider  ALPRAZolam Duanne Moron) 1 MG tablet Take 1 tablet by mouth twice daily as needed Patient taking differently: Take  1 mg by mouth 2 (two) times daily as needed for anxiety.  08/14/19   [name], MD  amLODipine (NORVASC) 2.5 MG tablet Take 1 tablet (2.5 mg total) by mouth daily. Patient taking differently: Take 2.5 mg by mouth every evening.  10/25/19   Gollan, Kathlene November, MD  apixaban (ELIQUIS) 5 MG TABS tablet Take 1 tablet (5 mg total) by mouth 2 (two) times daily. 11/23/19   [name], MD  buPROPion (WELLBUTRIN XL) 300 MG 24 hr tablet Take 1 tablet by mouth once daily Patient taking differently: Take 300 mg by mouth daily.  10/21/19   [name], MD  carvedilol (COREG) 12.5 MG tablet Take 1 tablet (12.5 mg total) by mouth 2 (two) times daily with a meal. 10/20/19   Dwyane Dee, MD  clopidogrel (PLAVIX) 75 MG tablet Take 1 tablet by mouth once daily 11/24/19   Loel Dubonnet, NP  EUTHYROX 25 MCG tablet TAKE 1 TABLET BY MOUTH ONCE DAILY BEFORE BREAKFAST Patient taking differently: Take 25 mcg by mouth daily before breakfast.  10/27/19   [name], MD  FIBER COMPLETE PO Take 5 mg by mouth.    [provider]  gabapentin (NEURONTIN) 300 MG capsule Take 1 capsule (300 mg total) by mouth 2 (two) times daily. 10/12/18   [name], MD  isosorbide mononitrate (IMDUR) 120 MG 24 hr  tablet Take 1 tablet (120 mg total) by mouth daily. 10/21/19   Dwyane Dee, MD  Multiple Vitamin (MULTIVITAMIN) capsule Take 1 capsule by mouth daily. gummies    [provider]  nitroGLYCERIN (NITROSTAT) 0.4 MG SL tablet Place 1 tablet (0.4 mg total) under the tongue every 5 (five) minutes as needed for chest pain. 10/20/19   Dwyane Dee, MD  pantoprazole (PROTONIX) 40 MG tablet Take 1 tablet by mouth twice daily Patient taking differently: Take 40 mg by mouth 2 (two) times daily.  06/06/19   [name], MD  promethazine (PHENERGAN) 25 MG tablet Take 25 mg by mouth every 6 (six) hours as needed for nausea or vomiting.  07/25/19   [provider]  Psyllium (METAMUCIL FIBER PO) Take 5 g by mouth daily. gummies    [provider]  rosuvastatin (CRESTOR) 40 MG tablet Take 1 tablet (40 mg total) by mouth daily at 6 PM. 10/25/19   Gollan, Kathlene November, MD  traZODone (DESYREL) 100 MG tablet TAKE 1 TO 2 TABLETS BY MOUTH AT BEDTIME 11/15/19   [name], MD  UBRELVY 100 MG TABS Take 1 tablet by mouth 2 (two) times daily as needed (migraine).  09/13/19   [provider]  venlafaxine XR (EFFEXOR-XR) 150 MG 24 hr capsule Take 1 capsule (150 mg total) by mouth daily with breakfast. 10/23/19   [name], MD    Family History Family History  Problem Relation Age of Onset  . Hypertension Mother   . Anxiety disorder Mother   . Depression Mother   . Bipolar disorder Mother   . Heart disease Mother        No details  . Hyperlipidemia Mother   . Kidney disease Father   . Heart disease Father 57  . Hypertension Father   . Diabetes Father   . Stroke Father   . Colon cancer Father        dx in his 20's  . Anxiety disorder Father   . Depression Father   . Skin cancer Father   . Kidney  disease Sister   . Thyroid nodules Sister   . Hypertension Sister   . Hypertension Sister   . Diabetes Sister   . Hyperlipidemia Sister   . Depression Sister   . Breast  cancer Maternal Aunt 62  . Breast cancer Maternal Aunt 18  . Ovarian cancer Cousin   . Colon cancer Cousin   . Kidney cancer Cousin   . Breast cancer Other   . Bladder Cancer Neg Hx     Social History Social History   Tobacco Use  . Smoking status: Former Smoker    Types: Cigarettes    Quit date: 08/31/1994    Years since quitting: 25.2  . Smokeless tobacco: Never Used  . Tobacco comment: quit 28 years ago  Vaping Use  . Vaping Use: Never used  Substance Use Topics  . Alcohol use: No    Alcohol/week: 0.0 standard drinks  . Drug use: No     Allergies   Lipitor [atorvastatin] and Tramadol   Review of Systems Review of Systems   Physical Exam Triage Vital Signs ED Triage Vitals  Enc Vitals Group     BP 11/27/19 1225 118/82     Pulse Rate 11/27/19 1225 78     Resp 11/27/19 1225 17     Temp 11/27/19 1225 98 F (36.7 C)     Temp Source 11/27/19 1225 Oral     SpO2 11/27/19 1225 97 %     Weight --      Height --      Head Circumference --      Peak Flow --      Pain Score 11/27/19 1223 10     Pain Loc --      Pain Edu? --      Excl. in [place]? --    No data found.  Updated Vital Signs BP 118/82 (BP Location: Right Arm)   Pulse 78   Temp 98 F (36.7 C) (Oral)   Resp 17   SpO2 97%   Visual Acuity Right Eye Distance:   Left Eye Distance:   Bilateral Distance:    Right Eye Near:   Left Eye Near:    Bilateral Near:     Physical Exam Vitals and nursing note reviewed.  Constitutional:      General: She is not in acute distress.    Appearance: Normal appearance. She is well-developed. She is not ill-appearing, toxic-appearing or diaphoretic.  HENT:     Head: Normocephalic and atraumatic.     Nose: Nose normal.     Mouth/Throat:     Mouth: Mucous membranes are moist.     Pharynx: Oropharynx is clear.  Eyes:     Extraocular Movements: Extraocular movements intact.     Conjunctiva/sclera: Conjunctivae normal.     Pupils: Pupils are equal, round, and  reactive to light.  Cardiovascular:     Rate and Rhythm: Normal rate and regular rhythm.     Heart sounds: Normal heart sounds. No murmur heard.   Pulmonary:     Effort: Pulmonary effort is normal. No respiratory distress.     Breath sounds: Normal breath sounds.  Abdominal:     Palpations: Abdomen is soft.     Tenderness: There is no abdominal tenderness.  Musculoskeletal:        General: Swelling and tenderness present.     Cervical back: Normal range of motion and neck supple.     Comments: Left knee  Skin:    General: Skin  is warm and dry.     Capillary Refill: Capillary refill takes less than 2 seconds.     Findings: Abrasion present.          Comments: Abrasion to L elbow oozing blood  Neurological:     General: No focal deficit present.     Mental Status: She is alert and oriented to person, place, and time.  Psychiatric:        Mood and Affect: Mood normal.        Behavior: Behavior normal.        Thought Content: Thought content normal.      UC Treatments / Results  Labs (all labs ordered are listed, but only abnormal results are displayed) Labs Reviewed - No data to display  EKG   Radiology No results found.  Procedures Laceration Repair  Date/Time: 11/27/2019 12:52 PM Performed by: Faustino Congress, NP Authorized by: Faustino Congress, NP   Consent:    Consent obtained:  Verbal   Risks discussed:  Infection Anesthesia (see MAR for exact dosages):    Anesthesia method:  None Laceration details:    Location:  Shoulder/arm   Shoulder/arm location:  L elbow Exploration:    Hemostasis achieved with:  Cautery Post-procedure details:    Dressing:  Open (no dressing)   (including critical care time)  Medications Ordered in UC Medications - No data to display  Initial Impression / Assessment and Plan / UC Course  I have reviewed the triage vital signs and the nursing notes.  Pertinent labs & imaging results that were available during my  care of the patient were reviewed by me and considered in my medical decision making (see chart for details).     Fall Left elbow pain Left knee pain Abrasion  L knee xray negative today Applied silver nitrate to abrasion at L elbow, oozing stopped Follow up with PCP or with ortho if pain is persisting Follow up with the ER for trouble swallowing, trouble breathing, high fever, changes in sensation, loss of consciousness, other concerning symptoms    Final Clinical Impressions(s) / UC Diagnoses   Final diagnoses:  Fall, initial encounter  Acute pain of left knee  Left elbow pain  Abrasion     Discharge Instructions     We have applied silver nitrate to your abrasion on the left elbow. This should take care of the oozing from the area.  Your xray was negative today for any fractures or misalignments  Follow up with primary care or with ortho as needed    ED Prescriptions    None     PDMP not reviewed this encounter.   Faustino Congress, NP 11/27/19 1345

## 2019-11-27 NOTE — Discharge Instructions (Addendum)
We have applied silver nitrate to your abrasion on the left elbow. This should take care of the oozing from the area.  Your xray was negative today for any fractures or misalignments  Follow up with primary care or with ortho as needed

## 2019-11-27 NOTE — Progress Notes (Signed)
Established patient visit   Patient: [name]   DOB: [birth year]   49 y.o. Female  MRN: [id number] Visit Date: 11/28/2019  Today's healthcare provider: [name], MD   Chief Complaint  Patient presents with  . Follow-up   Subjective    HPI  Follow up ER visit  Patient was seen in ER for Near syncope, chest pain and neck pain on 11/18/2019. She was seen at [facility] on 11/27/2019 for fall, left knee pain, left elbow pain and abrasion and Silver nitrate was applied to the abrasion on the left elbow. During her 11/18/2019 ER visit CTA was reassuring with no active PE.  She had been admitted a few weeks prior with right upper lobe PE on nuclear medication scan. She was treated with heparin at that admission and discharged on Eliquis.    She reports good compliance with treatment. She reports this condition is stable. Has had no more bruising or bleeding.   She reports her home blood pressures have in the low 100s and not having any syncope or near syncopal episodes. Has not had any more chest pain since her ER visit on the 16th. She is scheduled to see Dr. Fletcher Anon for CAD next week and states she is anticipating invasive cardiac procedure for CAD.  -----------------------------------------------------------------------------------------      Medications: Outpatient Medications Prior to Visit  Medication Sig  . ALPRAZolam (XANAX) 1 MG tablet Take 1 tablet by mouth twice daily as needed (Patient taking differently: Take 1 mg by mouth 2 (two) times daily as needed for anxiety. )  . amLODipine (NORVASC) 2.5 MG tablet Take 1 tablet (2.5 mg total) by mouth daily. (Patient taking differently: Take 2.5 mg by mouth every evening. )  . apixaban (ELIQUIS) 5 MG TABS tablet Take 1 tablet (5 mg total) by mouth 2 (two) times daily.  Marland Kitchen buPROPion (WELLBUTRIN XL) 300 MG 24 hr tablet Take 1 tablet by mouth once daily (Patient taking differently: Take 300 mg by mouth  daily. )  . carvedilol (COREG) 12.5 MG tablet Take 1 tablet (12.5 mg total) by mouth 2 (two) times daily with a meal.  . clopidogrel (PLAVIX) 75 MG tablet Take 1 tablet by mouth once daily  . EUTHYROX 25 MCG tablet TAKE 1 TABLET BY MOUTH ONCE DAILY BEFORE BREAKFAST (Patient taking differently: Take 25 mcg by mouth daily before breakfast. )  . FIBER COMPLETE PO Take 5 mg by mouth.  . gabapentin (NEURONTIN) 300 MG capsule Take 1 capsule (300 mg total) by mouth 2 (two) times daily.  . isosorbide mononitrate (IMDUR) 120 MG 24 hr tablet Take 1 tablet (120 mg total) by mouth daily.  . Multiple Vitamin (MULTIVITAMIN) capsule Take 1 capsule by mouth daily. gummies  . nitroGLYCERIN (NITROSTAT) 0.4 MG SL tablet Place 1 tablet (0.4 mg total) under the tongue every 5 (five) minutes as needed for chest pain.  . pantoprazole (PROTONIX) 40 MG tablet Take 1 tablet by mouth twice daily (Patient taking differently: Take 40 mg by mouth 2 (two) times daily. )  . promethazine (PHENERGAN) 25 MG tablet Take 25 mg by mouth every 6 (six) hours as needed for nausea or vomiting.   . Psyllium (METAMUCIL FIBER PO) Take 5 g by mouth daily. gummies  . rosuvastatin (CRESTOR) 40 MG tablet Take 1 tablet (40 mg total) by mouth daily at 6 PM.  . traZODone (DESYREL) 100 MG tablet TAKE 1 TO 2 TABLETS BY MOUTH AT BEDTIME  .  UBRELVY 100 MG TABS Take 1 tablet by mouth 2 (two) times daily as needed (migraine).   . venlafaxine XR (EFFEXOR-XR) 150 MG 24 hr capsule Take 1 capsule (150 mg total) by mouth daily with breakfast.   No facility-administered medications prior to visit.    Review of Systems  Constitutional: Negative for appetite change, chills, fatigue and fever.  Respiratory: Negative for chest tightness and shortness of breath.   Cardiovascular: Negative for chest pain and palpitations.  Gastrointestinal: Negative for abdominal pain, nausea and vomiting.  Musculoskeletal: Positive for arthralgias and back pain.    Neurological: Positive for headaches. Negative for dizziness and weakness.      Objective    BP 100/64 (BP Location: Left Arm, Patient Position: Sitting, Cuff Size: Normal)   Pulse 68   Temp 97.8 F (36.6 C) (Oral)   Resp 16   Wt 153 lb (69.4 kg)   BMI 27.98 kg/m    Physical Exam   General: Appearance:     Overweight female in no acute distress  Eyes:    PERRL, conjunctiva/corneas clear, EOM's intact       Skin:   Healing abrasion knee and elbow. Mild bruising of chin  Lungs:     Clear to auscultation bilaterally, respirations unlabored  Heart:    Normal heart rate. Normal rhythm. No murmurs, rubs, or gallops.   MS:   All extremities are intact.   Neurologic:   Awake, alert, oriented x 3. No apparent focal neurological           defect.        No results found for any visits on 11/28/19.  Assessment & Plan     1. Acute pulmonary embolism without acute cor pulmonale, unspecified pulmonary embolism type ([place]) Follow up CTA on 11-18-2019 did not show PE. Unclear if previous nuclear scan was false positive, or if PE had actually resolved with heparin treatment during prior admission. Considering CKD, BMI and being on clopidogrel I think there is a higher than average risk for bleeding complications on standard dose of Eliquis, however she is also at increased risk for thrombotic vascular events. Will continue Eliquis but reduce dose to - apixaban (ELIQUIS) 2.5 MG TABS tablet; Take 1 tablet (2.5 mg total) by mouth 2 (two) times daily.  Dispense: 84 sample tablets  2. Unstable angina (HCC) Improved, is to follow up with Dr. Fletcher Anon next week as scheduled.   3. Abrasion, elbow w/o infection Healing well with no more bleeding since cauterized at [facility] with silver nitrate.        The entirety of the information documented in the History of Present Illness, Review of Systems and Physical Exam were personally obtained by me. Portions of this information were initially documented by the  CMA and reviewed by me for thoroughness and accuracy.      [name], MD  [facility] [phone] (phone) [phone] (fax)  [place]

## 2019-11-28 ENCOUNTER — Ambulatory Visit (INDEPENDENT_AMBULATORY_CARE_PROVIDER_SITE_OTHER): Payer: Medicare Other | Admitting: Family Medicine

## 2019-11-28 ENCOUNTER — Encounter: Payer: Self-pay | Admitting: Family Medicine

## 2019-11-28 VITALS — BP 100/64 | HR 68 | Temp 97.8°F | Resp 16 | Wt 153.0 lb

## 2019-11-28 DIAGNOSIS — I200 Unstable angina: Secondary | ICD-10-CM | POA: Diagnosis not present

## 2019-11-28 DIAGNOSIS — S50319A Abrasion of unspecified elbow, initial encounter: Secondary | ICD-10-CM

## 2019-11-28 DIAGNOSIS — I252 Old myocardial infarction: Secondary | ICD-10-CM | POA: Diagnosis not present

## 2019-11-28 DIAGNOSIS — I25118 Atherosclerotic heart disease of native coronary artery with other forms of angina pectoris: Secondary | ICD-10-CM | POA: Diagnosis not present

## 2019-11-28 DIAGNOSIS — I2699 Other pulmonary embolism without acute cor pulmonale: Secondary | ICD-10-CM | POA: Diagnosis not present

## 2019-11-28 DIAGNOSIS — R55 Syncope and collapse: Secondary | ICD-10-CM | POA: Diagnosis not present

## 2019-11-28 DIAGNOSIS — E1142 Type 2 diabetes mellitus with diabetic polyneuropathy: Secondary | ICD-10-CM | POA: Diagnosis not present

## 2019-11-28 DIAGNOSIS — E11649 Type 2 diabetes mellitus with hypoglycemia without coma: Secondary | ICD-10-CM | POA: Diagnosis not present

## 2019-11-28 MED ORDER — APIXABAN 2.5 MG PO TABS: 2.5000 mg | ORAL_TABLET | Freq: Two times a day (BID) | ORAL | Status: DC

## 2019-11-28 NOTE — Patient Instructions (Addendum)
.   Please review the attached list of medications and notify my office if there are any errors.   . Reduce Eliquis to 2.5mg  twice a day

## 2019-11-29 DIAGNOSIS — E113411 Type 2 diabetes mellitus with severe nonproliferative diabetic retinopathy with macular edema, right eye: Secondary | ICD-10-CM | POA: Diagnosis not present

## 2019-11-29 LAB — HM DIABETES EYE EXAM

## 2019-11-30 ENCOUNTER — Other Ambulatory Visit: Payer: Self-pay | Admitting: Oncology

## 2019-11-30 IMAGING — DX DG CHEST 1V
1 series · 1 of 1 positions shown · non-contrast
Comparison: Portable chest x-ray September 09, 2017

CLINICAL DATA: Chest pain since early this morning. Recent coronary
artery stent placement. History of MI in June 2017 also history of
sepsis.

EXAM:
CHEST  1 VIEW

[chest ap]
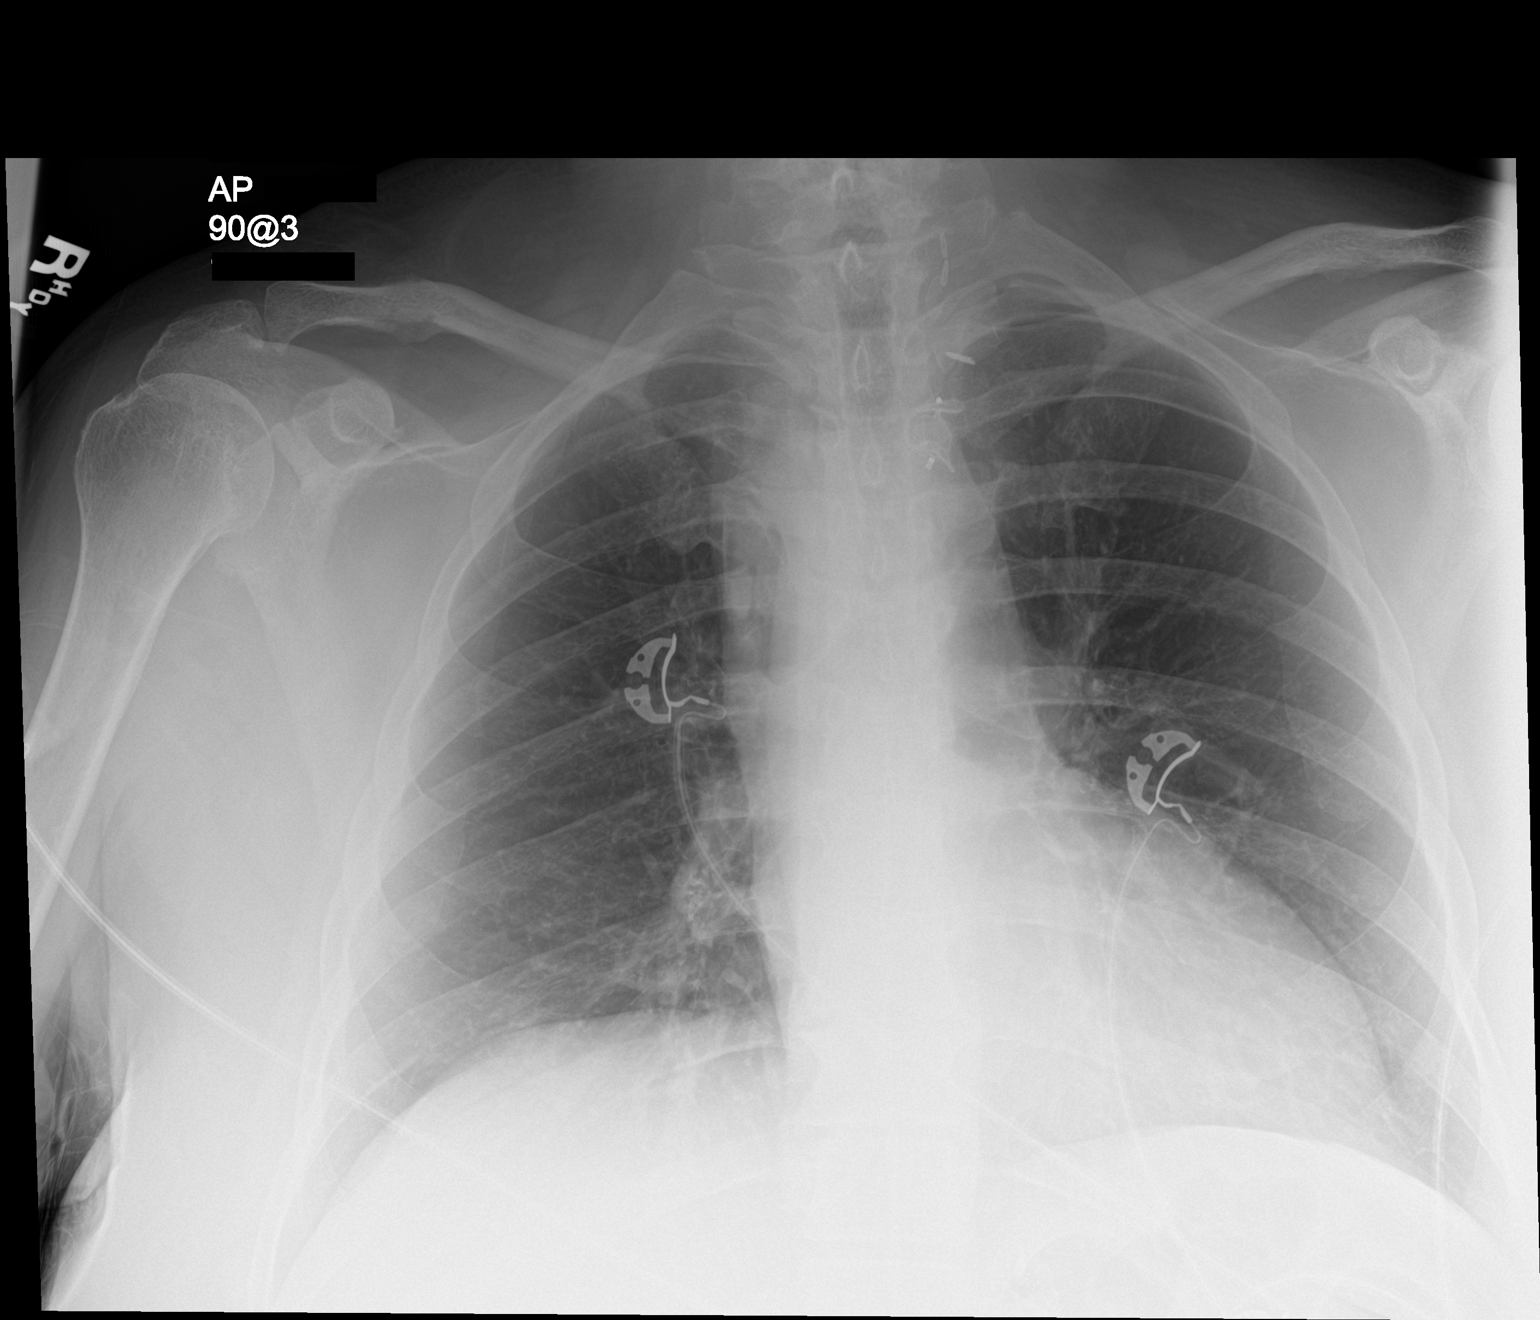

[1 of 1 positions shown; findings below may reference images not displayed]

FINDINGS: The lungs are well-expanded and clear. The heart is top-normal in
size. The pulmonary vascularity is normal. The mediastinum is normal
in width. There surgical clips in the left upper paratracheal
region. There is no pleural effusion. The bony thorax is
unremarkable.
IMPRESSION: There is no active cardiopulmonary disease.

## 2019-12-01 DIAGNOSIS — R55 Syncope and collapse: Secondary | ICD-10-CM | POA: Diagnosis not present

## 2019-12-01 DIAGNOSIS — E11649 Type 2 diabetes mellitus with hypoglycemia without coma: Secondary | ICD-10-CM | POA: Diagnosis not present

## 2019-12-01 DIAGNOSIS — I2699 Other pulmonary embolism without acute cor pulmonale: Secondary | ICD-10-CM | POA: Diagnosis not present

## 2019-12-01 DIAGNOSIS — E1142 Type 2 diabetes mellitus with diabetic polyneuropathy: Secondary | ICD-10-CM | POA: Diagnosis not present

## 2019-12-01 DIAGNOSIS — I252 Old myocardial infarction: Secondary | ICD-10-CM | POA: Diagnosis not present

## 2019-12-01 DIAGNOSIS — I25118 Atherosclerotic heart disease of native coronary artery with other forms of angina pectoris: Secondary | ICD-10-CM | POA: Diagnosis not present

## 2019-12-02 ENCOUNTER — Other Ambulatory Visit: Payer: Self-pay | Admitting: Family Medicine

## 2019-12-02 DIAGNOSIS — E039 Hypothyroidism, unspecified: Secondary | ICD-10-CM

## 2019-12-02 IMAGING — DX DG CHEST 1V
1 series · 1 of 1 positions shown · non-contrast
Comparison: Chest x-ray dated December 17, 2017.

CLINICAL DATA: Chest pain.

EXAM:
CHEST  1 VIEW

[chest ap]
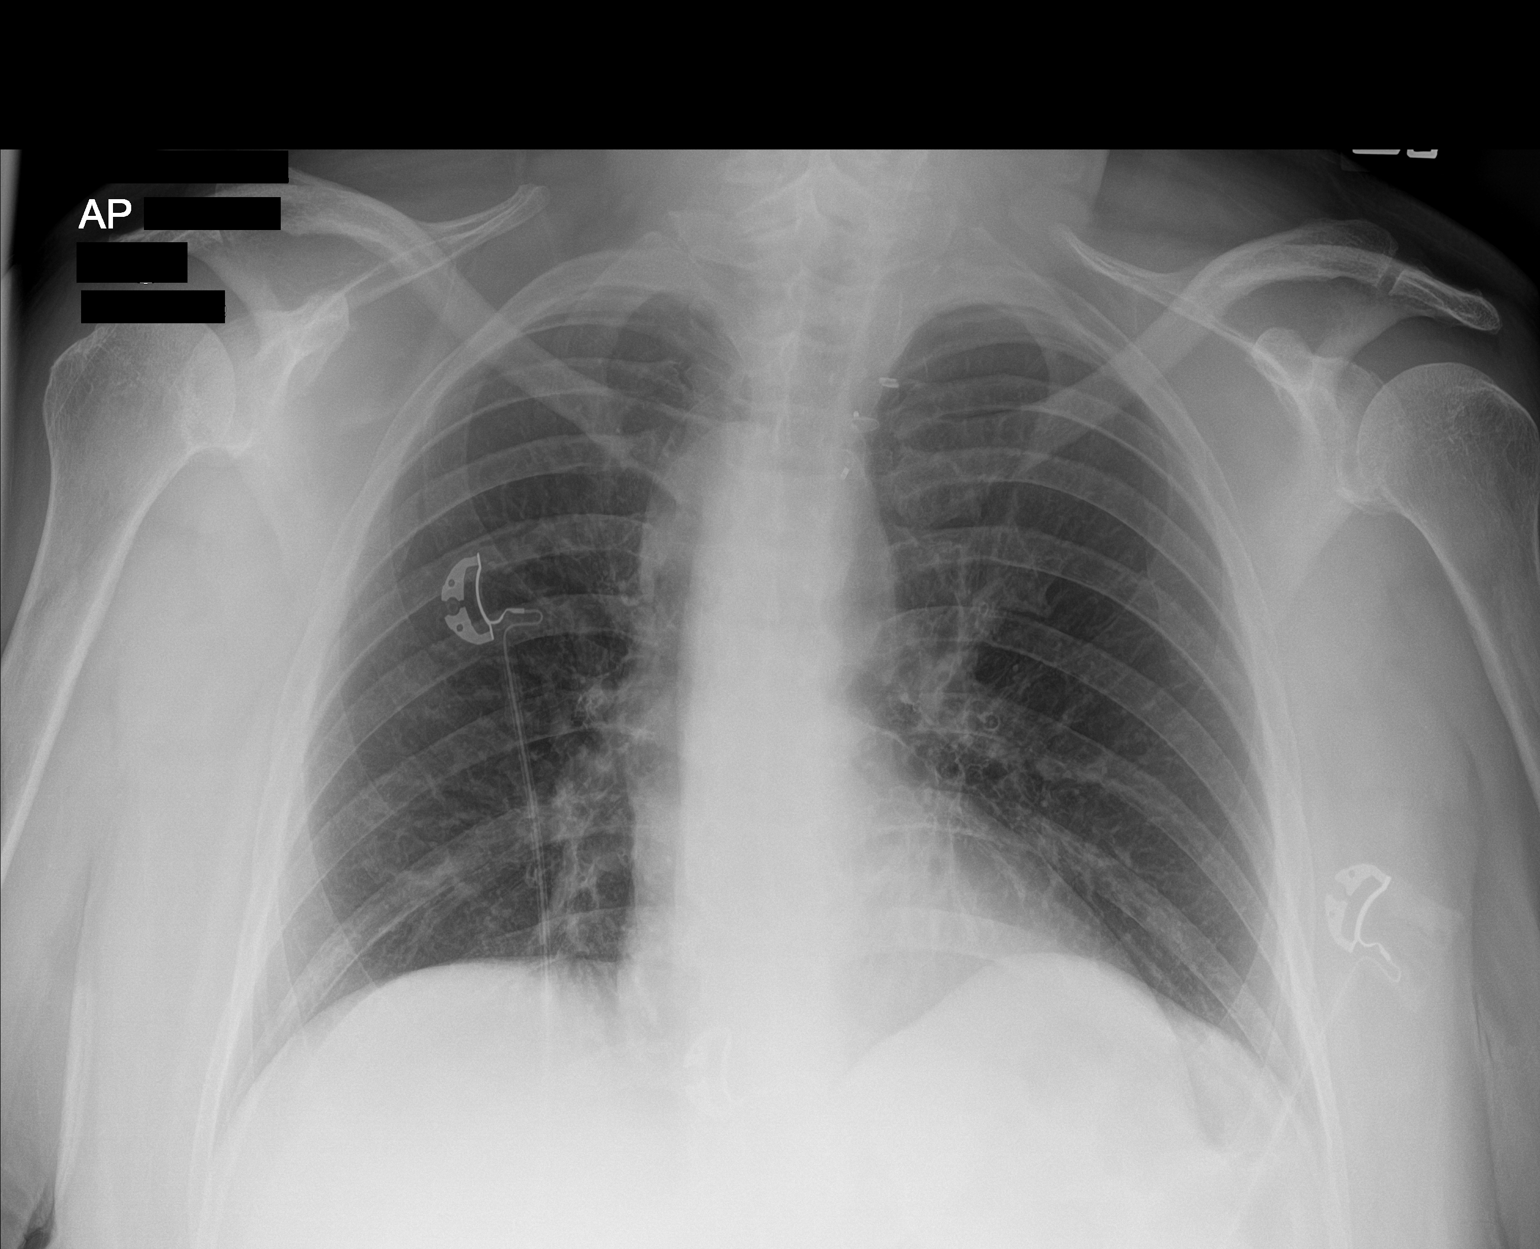

[1 of 1 positions shown; findings below may reference images not displayed]

FINDINGS: The heart size and mediastinal contours are within normal limits.
Unchanged right-sided aortic arch. Normal pulmonary vascularity. No
focal consolidation, pleural effusion, or pneumothorax. No acute
osseous abnormality.
IMPRESSION: No active disease.

## 2019-12-02 NOTE — Progress Notes (Signed)
[place]  [phone] [phone]) [phone]  ID: [name] OB: 08/19/1970  MR#: [id number]  JOI#:[id number]  Patient Care Team: [name], MD as PCP - General (Family Medicine) [name] Kathlene November, MD as PCP - Cardiology (Cardiology) Vladimir Crofts, MD as Consulting Physician (Neurology) Anthonette Legato, MD as Consulting Physician (Nephrology) Lloyd Huger, MD as Consulting Physician (Oncology) Poggi, Marshall Cork, MD as Consulting Physician (Orthopedic Surgery) Warnell Forester, NP as Nurse Practitioner (Surgery) Caroline More, DPM as Consulting Physician (Podiatry) Copland, Ginette Otto as Referring Physician (Obstetrics and Gynecology) Danice Goltz, MD as Consulting Physician (Ophthalmology) Carloyn Manner, MD as Referring Physician (Otolaryngology) Neldon Labella, RN as Registered Nurse  I connected with [name] on 12/08/19 at  2:00 PM EDT by video enabled telemedicine visit and verified that I am speaking with the correct person using two identifiers.   I discussed the limitations, risks, security and privacy concerns of performing an evaluation and management service by telemedicine and the availability of in-person appointments. I also discussed with the patient that there may be a patient responsible charge related to this service. The patient expressed understanding and agreed to proceed.   Other persons participating in the visit and their role in the encounter: Patient, MD.  Patient's location: Home. Provider's location: Clinic.  CHIEF COMPLAINT: Iron deficiency anemia.  INTERVAL HISTORY: Patient agreed to video enabled telemedicine visit for further evaluation and discussion of her laboratory results.  She currently feels well and is asymptomatic.  She does not complain of any weakness or fatigue. She has no neurologic complaints.  She denies any recent fevers or illnesses.  She has a good  appetite and denies weight loss.  She has no chest pain, shortness of breath, cough, or hemoptysis.  She denies any nausea, vomiting, constipation, or diarrhea.  She has no melena or hematochezia.  She has no urinary complaints.  Patient offers no specific complaints today.  REVIEW OF SYSTEMS:   Review of Systems  Constitutional: Negative.  Negative for fever, malaise/fatigue and weight loss.  Respiratory: Negative.  Negative for cough and shortness of breath.   Cardiovascular: Negative.  Negative for chest pain, palpitations and leg swelling.  Gastrointestinal: Negative.  Negative for blood in stool, diarrhea and melena.  Genitourinary: Negative.  Negative for hematuria.  Musculoskeletal: Negative.  Negative for back pain.  Skin: Negative.  Negative for rash.  Neurological: Negative.  Negative for dizziness, sensory change, focal weakness, weakness and headaches.  Psychiatric/Behavioral: Negative.  The patient is not nervous/anxious.     As per HPI. Otherwise, a complete review of systems is negative.  PAST MEDICAL HISTORY: Past Medical History:  Diagnosis Date  . Acute colitis 01/27/2017  . Acute pyelonephritis   . Anemia    iron deficiency anemia  . 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 BRCA negative 2014  . CAD (coronary artery disease)    a. 08/2003 Cath: LAD 30-40-med Rx; b. 11/2014 PCI: LAD 33m(3.25x23 Xience Alpine DES); c. 06/2015 PCI: D1 (2.25x12 Resolute Integrity DES); d. 06/2017 PCI: Patent mLAD stent, D2 95 (PTCA); e. 09/2017 PCI: D2 99ost (CBA); d. 12/2017 Cath: LM nl, LAD 370m80d (small), D1 40ost, D2 95ost, LCX 40p, RCA 40ost/p->Med rx for D2 given restenosis.  . Colitis 06/03/2015  . Colon polyp   . CVA (cerebral vascular accident) ([place]   Left side weakness.   . Degenerative tear of glenoid labrum of right shoulder 03/15/2017  .  Diabetes mellitus without complication ([place])   . Family history of breast cancer    BRCA neg 2014  . Femur fracture, left ([place]) 10/12/2018  .  Gastric ulcer 04/27/2011  . History of echocardiogram    a. 03/2017 Echo: EF 60-65%, no rwma; b. 02/2018 Echo: EF 60-65%, no rwma. Nl RV fxn. No cardiac source of emboli (admitted w/ stroke).  . Hypertension   . Malignant melanoma of skin of scalp ([place])   . MI, acute, non ST segment elevation ([place])   . Neuromuscular disorder ([place])   . S/P drug eluting coronary stent placement 06/04/2015  . Sepsis ([place]) 03/18/2017  . Stroke [facility])    a. 02/2018 MRI: 27m late acute/early subacute L medial frontal lobe inarct; b. 02/2018 MRA No large vessel occlusion or aneurysm. Mod to sev L P2 stenosis. thready L vertebral artery, diffusely dzs'd; c. 02/2018 Carotid U/S: <50% bilat ICA dzs.    PAST SURGICAL HISTORY: Past Surgical History:  Procedure Laterality Date  . APPENDECTOMY    . CARDIAC CATHETERIZATION N/A 11/09/2014   Procedure: Coronary Angiography;  Surgeon: TMinna Merritts MD;  [place] [place];  Service: Cardiovascular;  Laterality: N/A;  . CARDIAC CATHETERIZATION N/A 11/12/2014   Procedure: Coronary Stent Intervention;  Surgeon: AIsaias Cowman MD;  [place];  Service: Cardiovascular;  Laterality: N/A;  . CARDIAC CATHETERIZATION N/A 04/18/2015   Procedure: Left Heart Cath and Coronary Angiography;  Surgeon: TMinna Merritts MD;  [place] [facility];  Service: Cardiovascular;  Laterality: N/A;  . CARDIAC CATHETERIZATION Left 06/04/2015   Procedure: Left Heart Cath and Coronary Angiography;  Surgeon: MWellington Hampshire MD;  [place] [facility];  Service: Cardiovascular;  Laterality: Left;  . CARDIAC CATHETERIZATION N/A 06/04/2015   Procedure: Coronary Stent Intervention;  Surgeon: MWellington Hampshire MD;  [place];  Service: Cardiovascular;  Laterality: N/A;  . CESAREAN SECTION  2001  . CHOLECYSTECTOMY N/A 11/18/2016   Procedure: LAPAROSCOPIC CHOLECYSTECTOMY WITH INTRAOPERATIVE CHOLANGIOGRAM;  Surgeon: SChristene Lye MD;  Location: [facility];  Service: General;  Laterality: N/A;  . COLONOSCOPY WITH PROPOFOL N/A 04/27/2016   Procedure: COLONOSCOPY WITH PROPOFOL;  Surgeon: DLucilla Lame MD;  [place]  Service: Endoscopy;  Laterality: N/A;  . COLONOSCOPY WITH PROPOFOL N/A 01/12/2018   Procedure: COLONOSCOPY WITH PROPOFOL;  Surgeon: [place], TBenay Pike MD;  Location: [facility];  Service: Endoscopy;  Laterality: N/A;  . CORONARY ANGIOPLASTY    . CORONARY BALLOON ANGIOPLASTY N/A 06/29/2017   Procedure: CORONARY BALLOON ANGIOPLASTY;  Surgeon: AWellington Hampshire MD;  [place];  Service: Cardiovascular;  Laterality: N/A;  . CORONARY BALLOON ANGIOPLASTY N/A 09/20/2017   Procedure: CORONARY BALLOON ANGIOPLASTY;  Surgeon: AWellington Hampshire MD;  [place] [facility];  Service: Cardiovascular;  Laterality: N/A;  . DILATION AND CURETTAGE OF UTERUS    . ESOPHAGOGASTRODUODENOSCOPY (EGD) WITH PROPOFOL N/A 09/14/2014   Procedure: ESOPHAGOGASTRODUODENOSCOPY (EGD) WITH PROPOFOL;  Surgeon: MJosefine Class MD;  [facility];  Service: Endoscopy;  Laterality: N/A;  . ESOPHAGOGASTRODUODENOSCOPY (EGD) WITH PROPOFOL N/A 04/27/2016   Procedure: ESOPHAGOGASTRODUODENOSCOPY (EGD) WITH PROPOFOL;  Surgeon: DLucilla Lame MD;  [place]  Service: Endoscopy;  Laterality: N/A;  Diabetic - oral meds  . ESOPHAGOGASTRODUODENOSCOPY (EGD) WITH PROPOFOL N/A 01/12/2018   Procedure: ESOPHAGOGASTRODUODENOSCOPY (EGD) WITH PROPOFOL;  Surgeon: Tol[place], TBenay Pike MD;  Location: [facility];  Service: Endoscopy;  Laterality: N/A;  .  ESOPHAGOGASTRODUODENOSCOPY (EGD) WITH PROPOFOL N/A 04/11/2019   Procedure: ESOPHAGOGASTRODUODENOSCOPY (EGD) WITH PROPOFOL;  Surgeon: [name], MD;  [facility];  Service: Endoscopy;  Laterality: N/A;  . GASTRIC BYPASS  09/2009   [place] (IM) NAIL INTERTROCHANTERIC Left 10/13/2018    Procedure: INTRAMEDULLARY (IM) NAIL INTERTROCHANTRIC;  Surgeon: Leandrew Koyanagi, MD;  Location: [place];  Service: Orthopedics;  Laterality: Left;  . Left Carotid to sublcavian artery bypass w/ subclavian artery ligation     a. Performed @ [place].  . LEFT HEART CATH AND CORONARY ANGIOGRAPHY Left 06/29/2017   Procedure: LEFT HEART CATH AND CORONARY ANGIOGRAPHY;  Surgeon: Wellington Hampshire, MD;  Location: [place] [facility] LAB;  Service: Cardiovascular;  Laterality: Left;  . LEFT HEART CATH AND CORONARY ANGIOGRAPHY N/A 09/20/2017   Procedure: LEFT HEART CATH AND CORONARY ANGIOGRAPHY;  Surgeon: Wellington Hampshire, MD;  Location: [place] CV LAB;  Service: Cardiovascular;  Laterality: N/A;  . LEFT HEART CATH AND CORONARY ANGIOGRAPHY N/A 12/20/2017   Procedure: LEFT HEART CATH AND CORONARY ANGIOGRAPHY;  Surgeon: Wellington Hampshire, MD;  Location: [place] CV LAB;  Service: Cardiovascular;  Laterality: N/A;  . LEFT HEART CATH AND CORONARY ANGIOGRAPHY N/A 04/20/2019   Procedure: LEFT HEART CATH AND CORONARY ANGIOGRAPHY possible PCI;  Surgeon: Minna Merritts, MD;  Location: [place] CV LAB;  Service: Cardiovascular;  Laterality: N/A;  . MELANOMA EXCISION  2016   Dr. Evorn Gong  . [place]  2002  . RIGHT OOPHORECTOMY    . SHOULDER ARTHROSCOPY WITH OPEN ROTATOR CUFF REPAIR Right 01/07/2016   Procedure: SHOULDER ARTHROSCOPY WITH DEBRIDMENT, SUBACHROMIAL DECOMPRESSION;  Surgeon: Corky Mull, MD;  Location: [facility];  Service: Orthopedics;  Laterality: Right;  . SHOULDER ARTHROSCOPY WITH OPEN ROTATOR CUFF REPAIR Right 03/16/2017   Procedure: SHOULDER ARTHROSCOPY WITH OPEN ROTATOR CUFF REPAIR POSSIBLE BICEPS TENODESIS;  Surgeon: Corky Mull, MD;  Location: [facility];  Service: Orthopedics;  Laterality: Right;  . TRIGGER FINGER RELEASE Right     [place]    FAMILY HISTORY Family History  Problem Relation Age of Onset  . Hypertension Mother   . Anxiety disorder Mother   . Depression  Mother   . Bipolar disorder Mother   . Heart disease Mother        No details  . Hyperlipidemia Mother   . Kidney disease Father   . Heart disease Father 18  . Hypertension Father   . Diabetes Father   . Stroke Father   . Colon cancer Father        dx in his 36's  . Anxiety disorder Father   . Depression Father   . Skin cancer Father   . Kidney disease Sister   . Thyroid nodules Sister   . Hypertension Sister   . Hypertension Sister   . Diabetes Sister   . Hyperlipidemia Sister   . Depression Sister   . Breast cancer Maternal Aunt 64  . Breast cancer Maternal Aunt 9  . Ovarian cancer Cousin   . Colon cancer Cousin   . Kidney cancer Cousin   . Breast cancer Other   . Bladder Cancer Neg Hx        ADVANCED DIRECTIVES:    HEALTH MAINTENANCE: Social History   Tobacco Use  . Smoking status: Former Smoker    Types: Cigarettes    Quit date: 08/31/1994    Years since quitting: 25.2  . Smokeless tobacco: Never Used  .  Tobacco comment: quit 28 years ago  Vaping Use  . Vaping Use: Never used  Substance Use Topics  . Alcohol use: No    Alcohol/week: 0.0 standard drinks  . Drug use: No     Allergies  Allergen Reactions  . Lipitor [Atorvastatin] Other (See Comments)    Leg pains  . Tramadol Other (See Comments)    Mouth feels like it's on fire    Current Outpatient Medications  Medication Sig Dispense Refill  . ALPRAZolam (XANAX) 1 MG tablet Take 1 tablet by mouth twice daily as needed (Patient taking differently: Take 1 mg by mouth 2 (two) times daily as needed for anxiety. ) 60 tablet 3  . amLODipine (NORVASC) 2.5 MG tablet Take 1 tablet (2.5 mg total) by mouth daily. (Patient taking differently: Take 2.5 mg by mouth every evening. ) 90 tablet 3  . aspirin EC 81 MG tablet Take 1 tablet (81 mg total) by mouth daily. Swallow whole.    Marland Kitchen buPROPion (WELLBUTRIN SR) 200 MG 12 hr tablet Take 200 mg by mouth every morning.    . carvedilol (COREG) 12.5 MG tablet Take 1  tablet (12.5 mg total) by mouth 2 (two) times daily with a meal. 60 tablet 3  . clopidogrel (PLAVIX) 75 MG tablet Take 1 tablet by mouth once daily (Patient taking differently: Take 75 mg by mouth daily. ) 30 tablet 0  . EUTHYROX 25 MCG tablet TAKE 1 TABLET BY MOUTH ONCE DAILY BEFORE BREAKFAST (Patient taking differently: Take 25 mcg by mouth daily before breakfast. ) 90 tablet 3  . FIBER COMPLETE PO Take 3 capsules by mouth daily.     Marland Kitchen gabapentin (NEURONTIN) 300 MG capsule Take 1 capsule (300 mg total) by mouth 2 (two) times daily. 180 capsule 4  . isosorbide mononitrate (IMDUR) 120 MG 24 hr tablet Take 1 tablet (120 mg total) by mouth daily. 30 tablet 3  . Multiple Vitamin (MULTIVITAMIN) capsule Take 1 capsule by mouth daily. gummies    . nitroGLYCERIN (NITROSTAT) 0.4 MG SL tablet Place 1 tablet (0.4 mg total) under the tongue every 5 (five) minutes as needed for chest pain. 25 tablet 2  . pantoprazole (PROTONIX) 40 MG tablet Take 1 tablet by mouth twice daily (Patient taking differently: Take 40 mg by mouth 2 (two) times daily. ) 180 tablet 1  . promethazine (PHENERGAN) 25 MG tablet Take 25 mg by mouth every 6 (six) hours as needed for nausea or vomiting.     . rosuvastatin (CRESTOR) 40 MG tablet Take 1 tablet (40 mg total) by mouth daily at 6 PM. 90 tablet 3  . traZODone (DESYREL) 100 MG tablet TAKE 1 TO 2 TABLETS BY MOUTH AT BEDTIME (Patient taking differently: Take 100-200 mg by mouth at bedtime as needed for sleep. ) 30 tablet 5  . UBRELVY 100 MG TABS Take 100 mg by mouth 2 (two) times daily as needed (migraine).     . Biotin w/ Vitamins C & E (HAIR SKIN & NAILS GUMMIES PO) Take 4 tablets by mouth daily.    . cariprazine (VRAYLAR) capsule Take 1.5 mg by mouth daily.    . naproxen sodium (ALEVE) 220 MG tablet Take 440 mg by mouth daily as needed (pain).    Vladimir Faster Glycol-Propyl Glycol (SYSTANE OP) Place 1 drop into both eyes daily as needed (dry eyes).     Current Facility-Administered  Medications  Medication Dose Route Frequency Provider Last Rate Last Admin  . sodium chloride flush (NS)  0.9 % injection 3 mL  3 mL Intravenous Q12H Wellington Hampshire, MD        OBJECTIVE: There were no vitals filed for this visit.   There is no height or weight on file to calculate BMI.    ECOG FS:0 - Asymptomatic  General: Well-developed, well-nourished, no acute distress. HEENT: Normocephalic. Neuro: Alert, answering all questions appropriately. Cranial nerves grossly intact. Psych: Normal affect.   LAB RESULTS:  Lab Results  Component Value Date   NA 141 12/07/2019   K 3.8 12/07/2019   CL 107 (H) 12/07/2019   CO2 23 12/07/2019   GLUCOSE 100 (H) 12/07/2019   BUN 14 12/07/2019   CREATININE 1.84 (H) 12/07/2019   CALCIUM 8.8 12/07/2019   PROT 6.4 (L) 10/17/2019   ALBUMIN 3.1 (L) 10/17/2019   AST 26 10/17/2019   ALT 22 10/17/2019   ALKPHOS 78 10/17/2019   BILITOT 0.7 10/17/2019   GFRNONAA 31 (L) 12/07/2019   GFRAA 36 (L) 12/07/2019    Lab Results  Component Value Date   WBC 6.6 12/07/2019   NEUTROABS 4.6 12/07/2019   HGB 11.6 12/07/2019   HCT 35.5 12/07/2019   MCV 82 12/07/2019   PLT 188 12/07/2019   Lab Results  Component Value Date   IRON 37 12/06/2019   TIBC 265 12/06/2019   IRONPCTSAT 14 12/06/2019    Lab Results  Component Value Date   FERRITIN 66 12/06/2019     STUDIES: DG Chest 1 View  Result Date: 11/18/2019 CLINICAL DATA:  Sleepiness.  Neck pain. EXAM: CHEST  1 VIEW COMPARISON:  November 05, 2019 FINDINGS: The heart size and mediastinal contours are within normal limits. Both lungs are clear. The visualized skeletal structures are unremarkable. IMPRESSION: No active disease. Electronically Signed   By: Dorise Bullion III M.D   On: 11/18/2019 13:11   CT Head Wo Contrast  Result Date: 11/18/2019 CLINICAL DATA:  Altered mental status EXAM: CT HEAD WITHOUT CONTRAST TECHNIQUE: Contiguous axial images were obtained from the base of the skull through  the vertex without intravenous contrast. COMPARISON:  11/05/2019 FINDINGS: Brain: No evidence of acute infarction, hemorrhage, hydrocephalus, extra-axial collection or mass lesion/mass effect. Extensive periventricular and deep white matter hypodensity including lacunar infarctions of the right basal ganglia. Vascular: No hyperdense vessel or unexpected calcification. Skull: Normal. Negative for fracture or focal lesion. Sinuses/Orbits: No acute finding. Other: None. IMPRESSION: No acute intracranial pathology. Advanced small-vessel white matter disease. Electronically Signed   By: Eddie Candle M.D.   On: 11/18/2019 13:42   CT Angio Chest PE W and/or Wo Contrast  Result Date: 11/18/2019 CLINICAL DATA:  Neck pain radiating down the left shoulder. Recently discharged with diagnosis of a blood clot to her lungs. EXAM: CT ANGIOGRAPHY CHEST WITH CONTRAST TECHNIQUE: Multidetector CT imaging of the chest was performed using the standard protocol during bolus administration of intravenous contrast. Multiplanar CT image reconstructions and MIPs were obtained to evaluate the vascular anatomy. CONTRAST:  84m OMNIPAQUE IOHEXOL 350 MG/ML SOLN COMPARISON:  Nuclear medicine perfusion study dated 11/06/2019. Current chest radiograph. Prior chest CT, 01/19/2019. FINDINGS: Cardiovascular: Satisfactory opacification of the pulmonary arteries to the segmental level. No evidence of pulmonary embolism. Heart is normal in size and configuration. No pericardial effusion. Left coronary artery calcifications. Great vessels are normal in caliber. There is a protrusion from the right aspect of the distal aortic arch. The aortic arch remains on the right. The descending thoracic aorta also is on the right. These findings are stable  from the prior CT there consistent with a partial or incomplete vascular ring, a developmental anomaly. There is mild distension of the esophagus above this. Mediastinum/Nodes: No neck base, axillary,  mediastinal or hilar masses or enlarged lymph nodes. Surgical clips in the left superior mediastinum, stable. Lungs/Pleura: Small area of patchy airspace opacity in the posterolateral right upper lobe new since the prior CT. Minimal dependent linear atelectasis in the lower lobes. Remainder of the lungs is clear. No pleural effusion or pneumothorax. Upper Abdomen: No acute findings. Stable changes from previous gastric/hiatal hernia surgery. Musculoskeletal: No fracture or acute finding.  No bone lesion. Review of the MIP images confirms the above findings. IMPRESSION: 1. No evidence of a pulmonary embolism. Specifically, no embolus is noted to the right upper lobe to correspond to the perfusion defect noted on the recent pulmonary perfusion exam. 2. Small patchy area of airspace opacity in the posterolateral right upper lobe. This is consistent with infection in the proper clinical setting, but could be due to atelectasis. 3. No other evidence of acute cardiopulmonary disease. 4. 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 Adjacent vascular clips are consistent with previous surgical treatment for this anomaly. These findings are stable from the prior chest CT. 5. Left coronary artery calcifications. Electronically Signed   By: Lajean Manes M.D.   On: 11/18/2019 17:14   DG Knee Complete 4 Views Left  Result Date: 11/27/2019 CLINICAL DATA:  LEFT knee pain for 3 days after rolling off bed EXAM: LEFT KNEE - COMPLETE 4+ VIEW COMPARISON:  Sex LEFT femoral radiographs 01/10/2019 FINDINGS: IM nail within mid to distal LEFT femur. Osseous demineralization. Joint spaces preserved. No acute fracture, dislocation, or bone destruction. No joint effusion. Extensive atherosclerotic calcifications at the distal thigh, knee, and proximal lower leg. IMPRESSION: No acute osseous abnormalities. Electronically Signed   By: Lavonia Dana M.D.   On: 11/27/2019 14:10    ASSESSMENT: Iron  deficiency anemia.  PLAN:    1. Iron deficiency anemia: Essentially resolved.  Patient's hemoglobin remains mildly decreased at 11.6, but relatively stable.  Iron stores continue to be within normal limits.  She likely has poor absorption given her history of gastric bypass surgery.  Previously, her entire anemia work-up was either negative or within normal limits.  She last received 510 mg IV Feraheme on February 15, 2019.  She does not require additional treatment today.  Return to clinic in 4 months with repeat laboratory work, further evaluation, and continuation of treatment if necessary.  At that point, patient does not require treatment, can consider discharging from clinic. 2.  Diarrhea/esophageal stricture: Patient does not complain of this today.  Continue evaluation and treatment per GI.     3.  Chronic renal insufficiency: Patient's creatinine is 1.84.  I provided 20 minutes of face-to-face video visit time during this encounter which included chart review, counseling, and coordination of care as documented above.   Patient expressed understanding and was in agreement with this plan. She also understands that She can call clinic at any time with any questions, concerns, or complaints.   Lloyd Huger, MD   12/08/2019 3:56 PM

## 2019-12-02 NOTE — Telephone Encounter (Signed)
Requested Prescriptions  Pending Prescriptions Disp Refills  . EUTHYROX 25 MCG tablet [Pharmacy Med Name: Euthyrox 25 MCG Oral Tablet] 30 tablet 0    Sig: TAKE 1 TABLET BY MOUTH ONCE DAILY BEFORE BREAKFAST     Endocrinology:  Hypothyroid Agents Failed - 12/02/2019  4:45 PM      Failed - TSH needs to be rechecked within 3 months after an abnormal result. Refill until TSH is due.      Passed - TSH in normal range and within 360 days    TSH  Date Value Ref Range Status  10/18/2019 1.544 0.350 - 4.500 uIU/mL Final    Comment:    Performed by a 3rd Generation assay with a functional sensitivity of <=0.01 uIU/mL. Performed at [facility], [address] [place], [place] [address]   12/08/2016 1.53 mIU/L Final    Comment:              Reference Range .           > or = 20 Years  0.40-4.50 .                Pregnancy Ranges           First trimester    0.26-2.66           Second trimester   0.55-2.73           Third trimester    0.43-2.91          Passed - Valid encounter within last 12 months    Recent Outpatient Visits          4 days ago Acute pulmonary embolism without acute cor pulmonale, unspecified pulmonary embolism type [facility])   [facility] [name], MD   2 weeks ago Acute pulmonary embolism without acute cor pulmonale, unspecified pulmonary embolism type [facility])   [facility] [name], MD   2 months ago Dysphagia, unspecified type   [facility] [name], MD   3 months ago Alice, Donald E, MD   3 months ago Encounter for [employer] annual wellness exam   [facility] [place], Stage manager, LPN      Future Appointments            In 5 days Wellington Hampshire, MD [facility]   In 1 month Fisher, Kirstie Peri, MD [facility], [place]

## 2019-12-04 DIAGNOSIS — I2699 Other pulmonary embolism without acute cor pulmonale: Secondary | ICD-10-CM | POA: Diagnosis not present

## 2019-12-04 DIAGNOSIS — E1142 Type 2 diabetes mellitus with diabetic polyneuropathy: Secondary | ICD-10-CM | POA: Diagnosis not present

## 2019-12-04 DIAGNOSIS — I25118 Atherosclerotic heart disease of native coronary artery with other forms of angina pectoris: Secondary | ICD-10-CM | POA: Diagnosis not present

## 2019-12-04 DIAGNOSIS — R55 Syncope and collapse: Secondary | ICD-10-CM | POA: Diagnosis not present

## 2019-12-04 DIAGNOSIS — E11649 Type 2 diabetes mellitus with hypoglycemia without coma: Secondary | ICD-10-CM | POA: Diagnosis not present

## 2019-12-04 DIAGNOSIS — I252 Old myocardial infarction: Secondary | ICD-10-CM | POA: Diagnosis not present

## 2019-12-06 ENCOUNTER — Other Ambulatory Visit: Payer: Self-pay

## 2019-12-06 ENCOUNTER — Inpatient Hospital Stay: Payer: Medicare Other | Attending: Oncology

## 2019-12-06 DIAGNOSIS — D509 Iron deficiency anemia, unspecified: Secondary | ICD-10-CM

## 2019-12-06 LAB — CBC WITH DIFFERENTIAL/PLATELET
Abs Immature Granulocytes: 0.03 10*3/uL (ref 0.00–0.07)
Basophils Absolute: 0.1 10*3/uL (ref 0.0–0.1)
Basophils Relative: 1 %
Eosinophils Absolute: 0.4 10*3/uL (ref 0.0–0.5)
Eosinophils Relative: 4 %
HCT: 33.9 % — ABNORMAL LOW (ref 36.0–46.0)
Hemoglobin: 11.2 g/dL — ABNORMAL LOW (ref 12.0–15.0)
Immature Granulocytes: 0 %
Lymphocytes Relative: 24 %
Lymphs Abs: 1.9 10*3/uL (ref 0.7–4.0)
MCH: 27 pg (ref 26.0–34.0)
MCHC: 33 g/dL (ref 30.0–36.0)
MCV: 81.7 fL (ref 80.0–100.0)
Monocytes Absolute: 0.5 10*3/uL (ref 0.1–1.0)
Monocytes Relative: 6 %
Neutro Abs: 5.1 10*3/uL (ref 1.7–7.7)
Neutrophils Relative %: 65 %
Platelets: 172 10*3/uL (ref 150–400)
RBC: 4.15 MIL/uL (ref 3.87–5.11)
RDW: 13.2 % (ref 11.5–15.5)
WBC: 7.9 10*3/uL (ref 4.0–10.5)
nRBC: 0 % (ref 0.0–0.2)

## 2019-12-06 LAB — IRON AND TIBC
Iron: 37 ug/dL (ref 28–170)
Saturation Ratios: 14 % (ref 10.4–31.8)
TIBC: 265 ug/dL (ref 250–450)
UIBC: 228 ug/dL

## 2019-12-06 LAB — FERRITIN: Ferritin: 66 ng/mL (ref 11–307)

## 2019-12-06 IMAGING — CR DG CHEST 2V
2 series · 2 of 2 positions shown · non-contrast
Comparison: December 19, 2017

CLINICAL DATA: Chest pain.

EXAM:
CHEST - 2 VIEW

[chest pa]
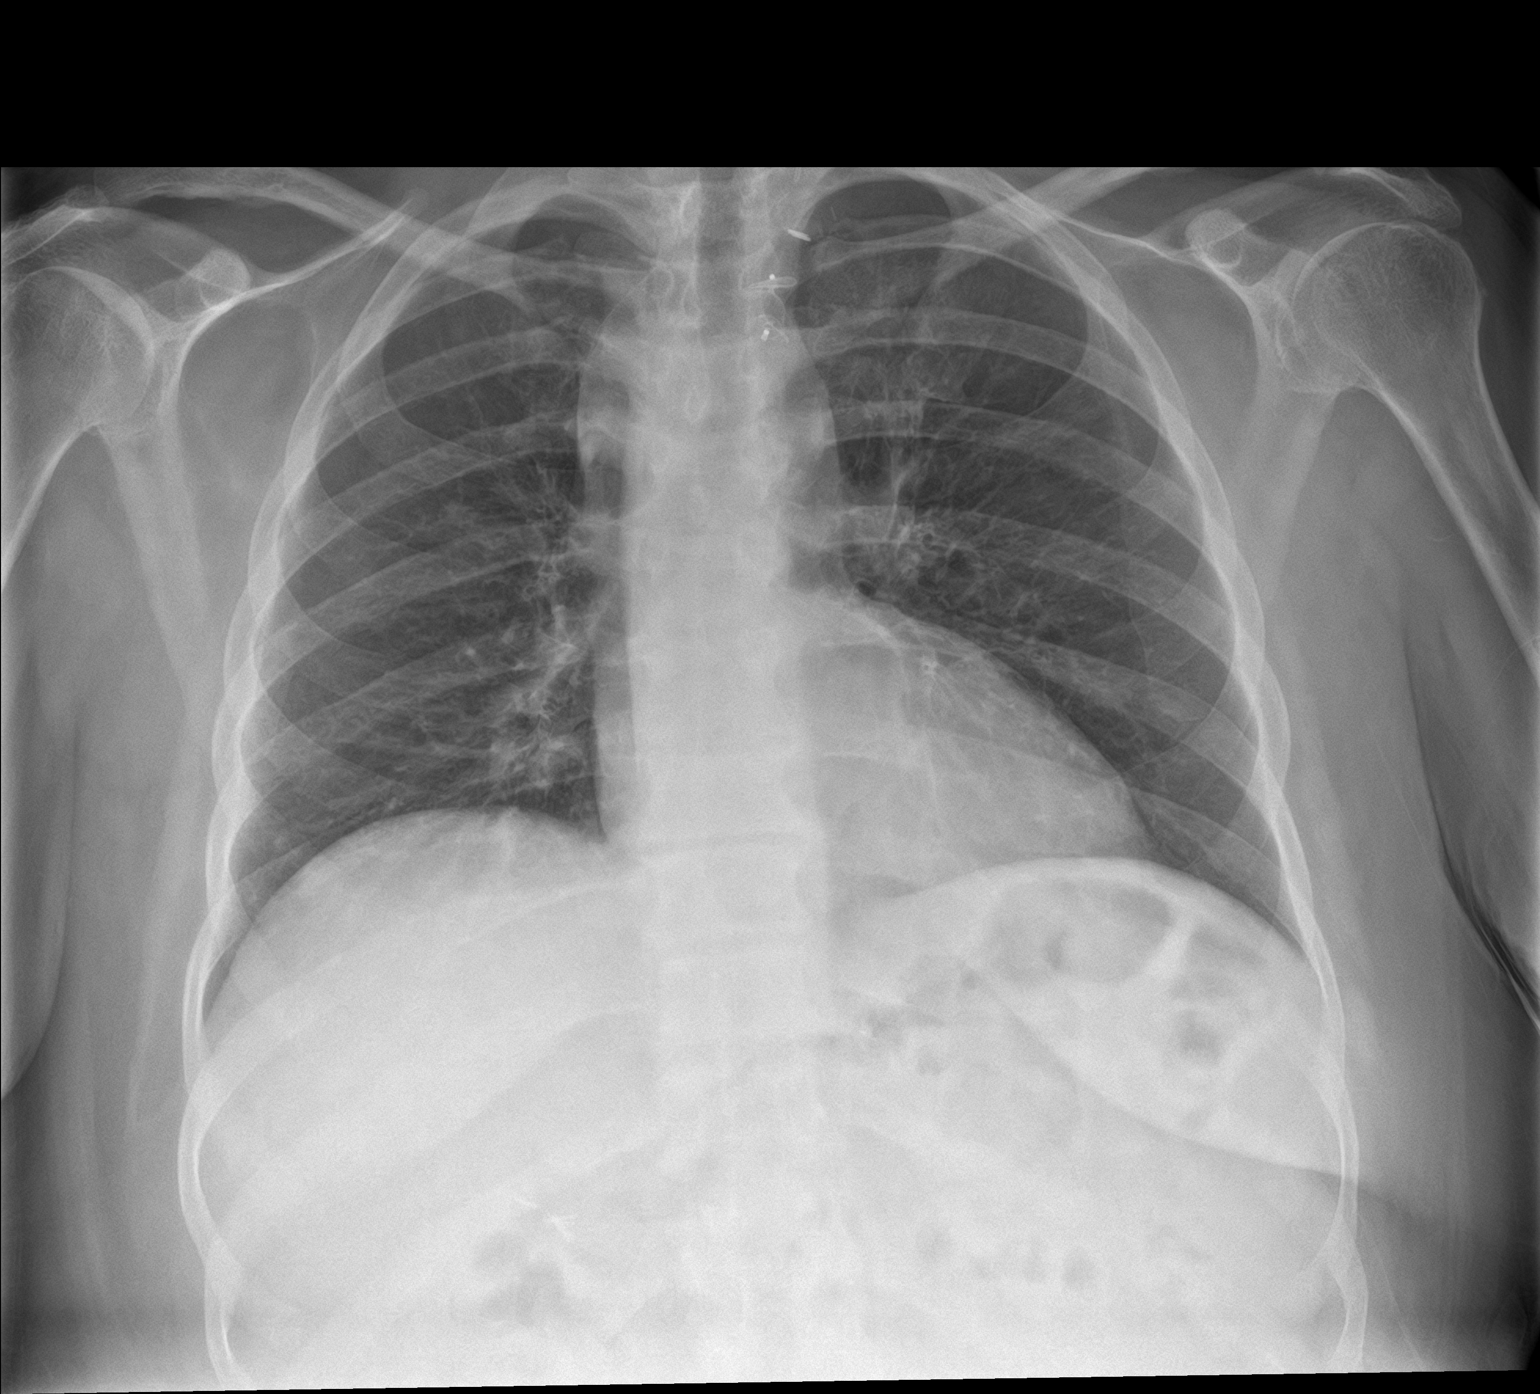

[chest lat]
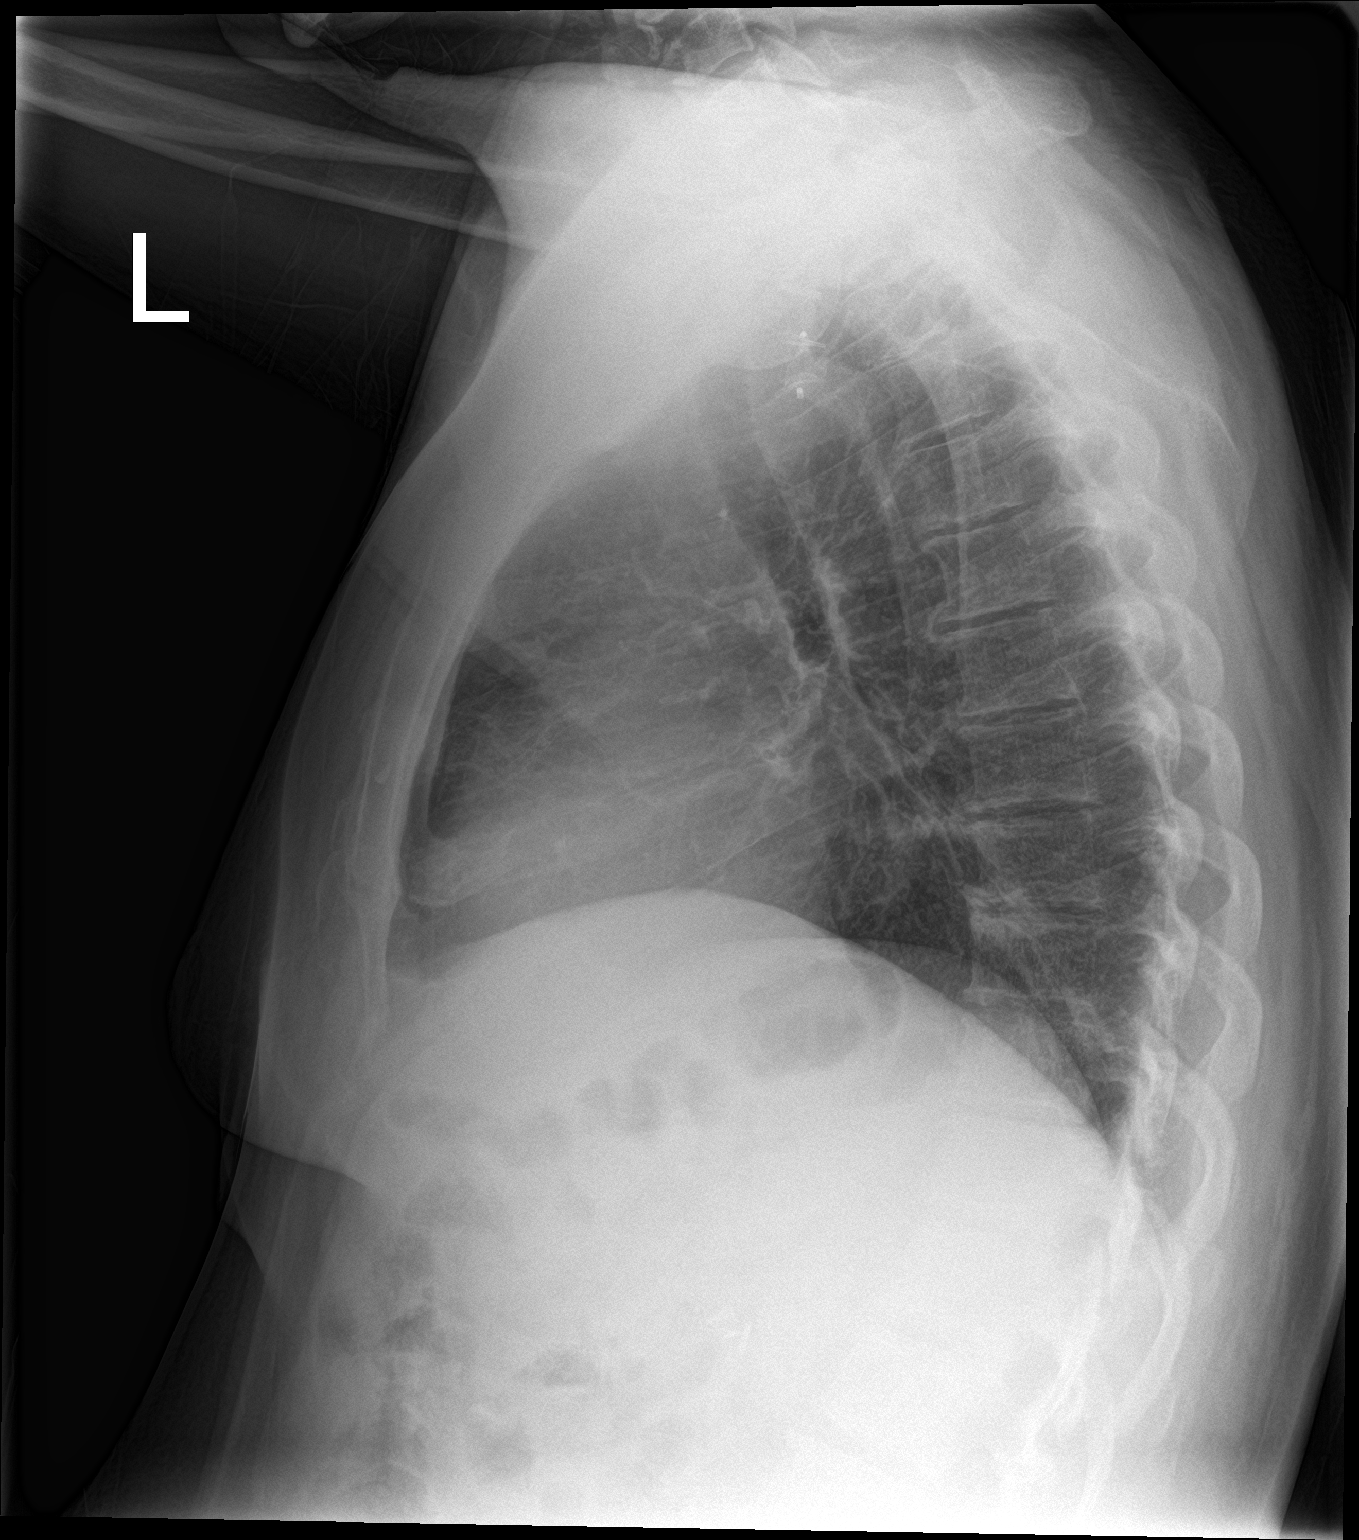

[2 of 2 positions shown; findings below may reference images not displayed]

FINDINGS: The heart size and mediastinal contours are within normal limits.
Both lungs are clear. The visualized skeletal structures are
unremarkable.
IMPRESSION: No active cardiopulmonary disease.

## 2019-12-06 IMAGING — CT CT CERVICAL SPINE W/O CM
4 of 7 series · 14 of 33 positions shown, 15 images · non-contrast
Comparison: 11/04/2017 CT of head and cervical spine.

CLINICAL DATA: 47 y/o F; woke up on floor without remembering
falling.

EXAM:
CT HEAD WITHOUT CONTRAST
CT CERVICAL SPINE WITHOUT CONTRAST
TECHNIQUE: Multidetector CT imaging of the head and cervical spine was
performed following the standard protocol without intravenous
contrast. Multiplanar CT image reconstructions of the cervical spine
were also generated.

[Series 4: coronal soft tissue · coronal · 0.27mm/px · 3 of 62 slices shown]
[im 16/62  bone]
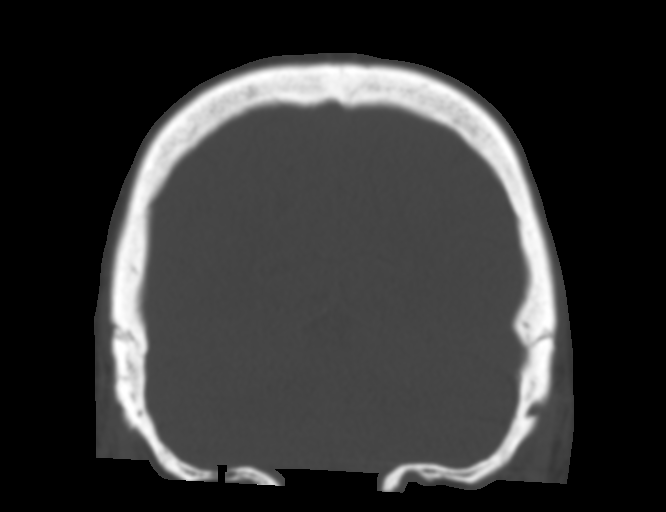
[im 31/62  bone]
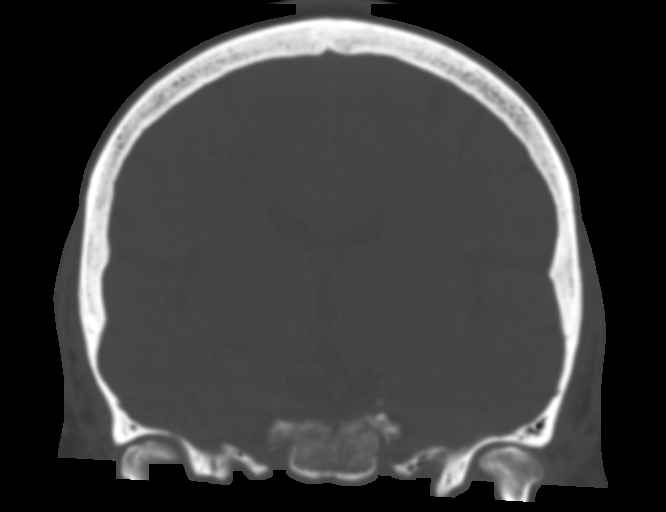
[im 46/62  bone]
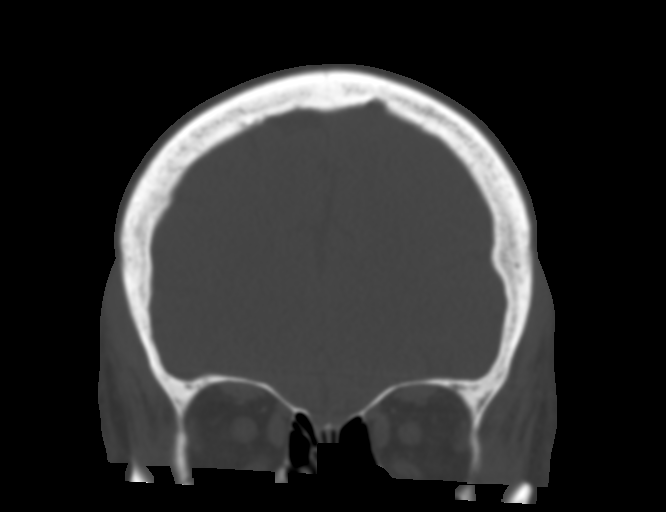

[Series 7: c spine soft · axial · 0.29mm/px · z∈[+56,+94]mm · 2 of 95 slices shown]
[im 19/95  soft-tissue]
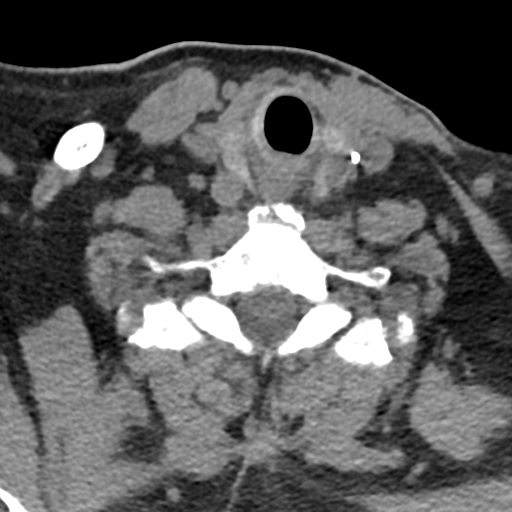
[im 38/95  soft-tissue]
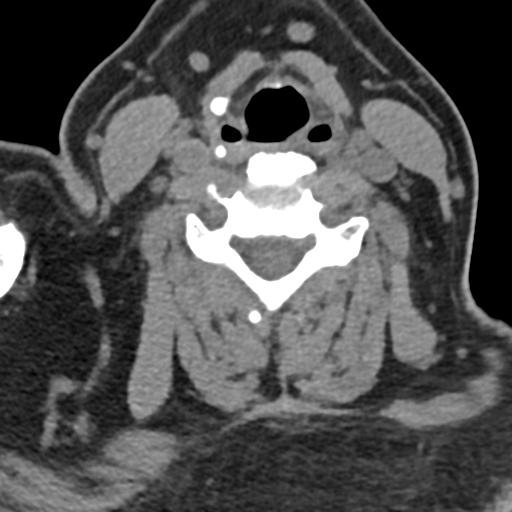

[Series 8: sagittal bone · sagittal · 0.23mm/px · 5 of 59 slices shown]
[im 10/59  bone]
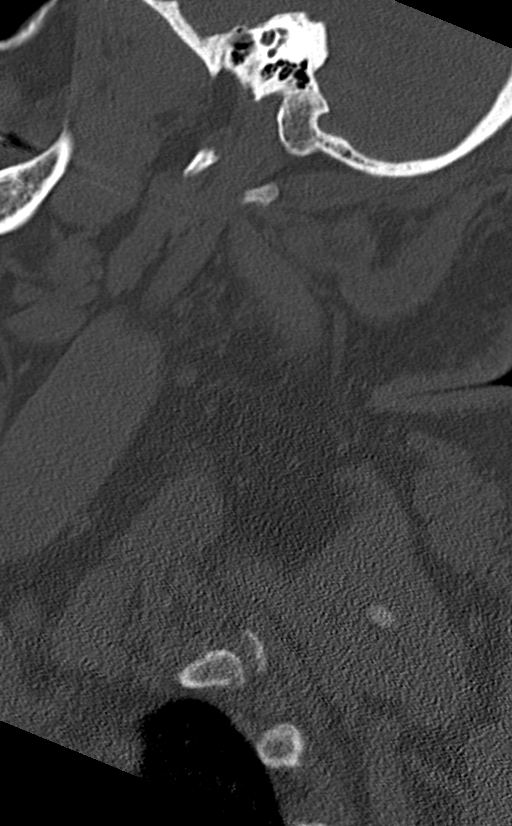
[im 20/59  bone]
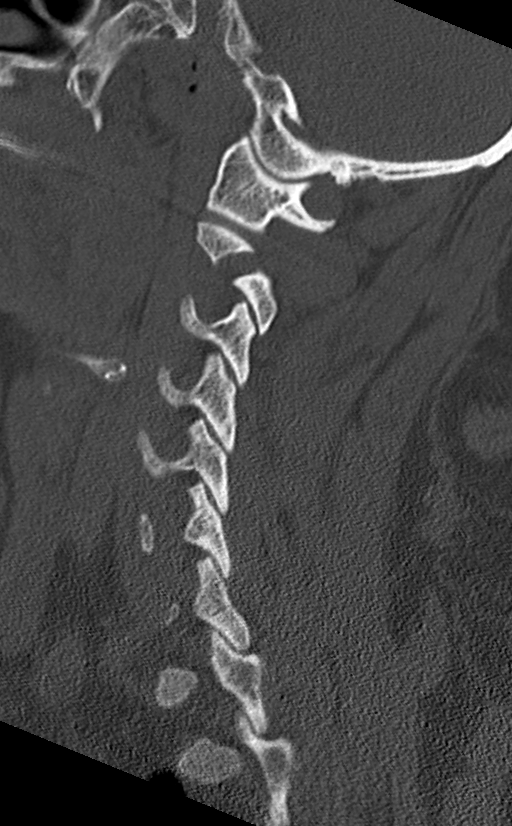
[im 30/59  bone]
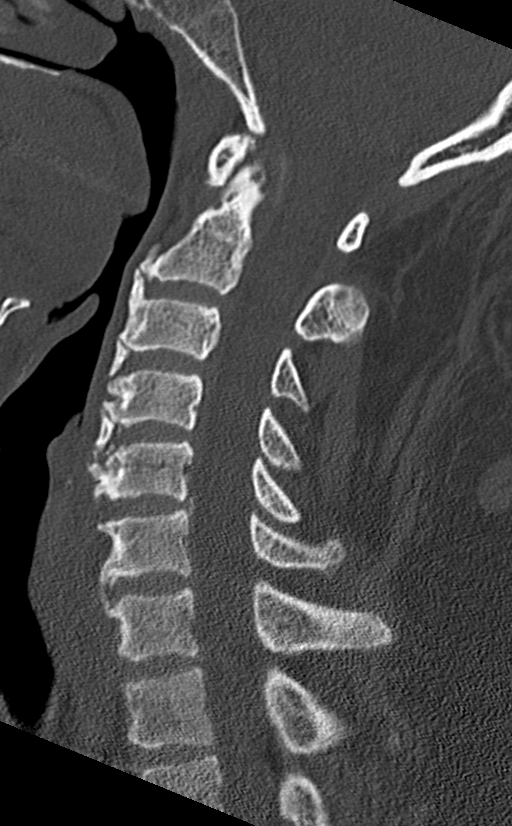
[im 39/59  bone]
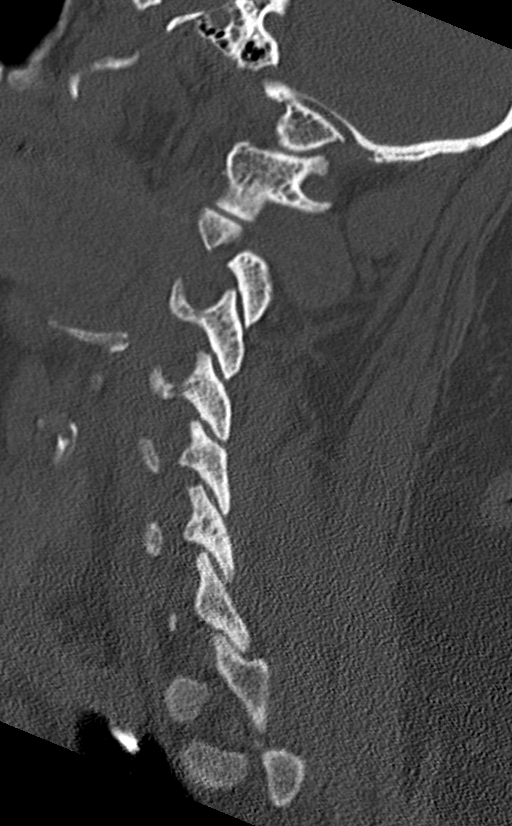
[im 49/59  bone]
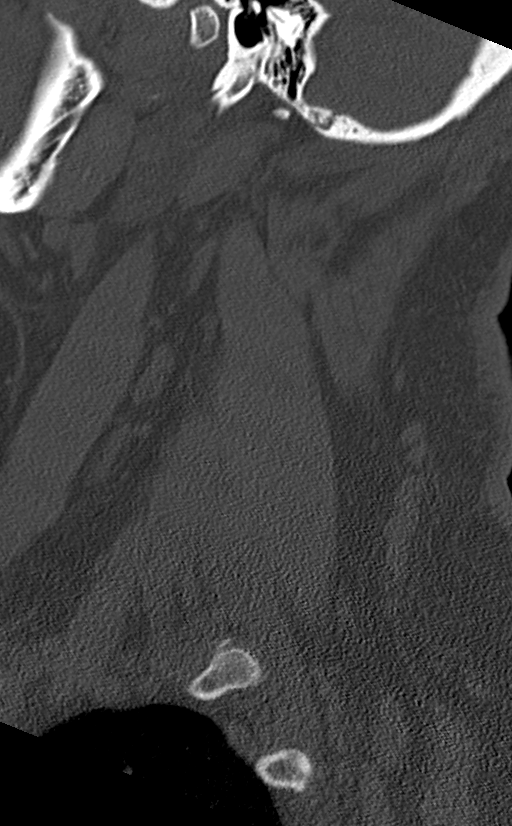

[Series 10: orthogonal bone · axial · 0.24mm/px · z∈[+36,+144]mm · 4 of 97 slices shown, 5 images]
[im 20/97  soft-tissue]
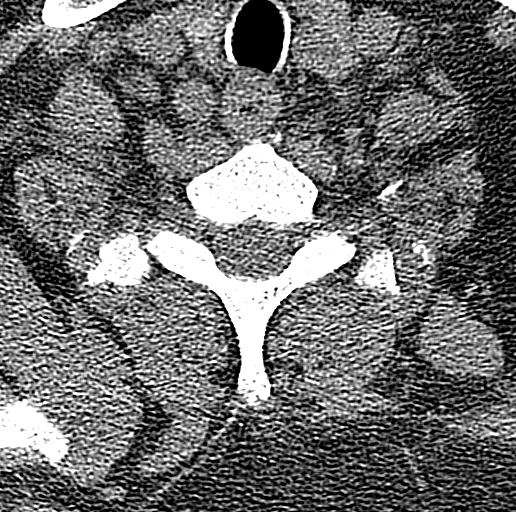
[im 20/97  bone]
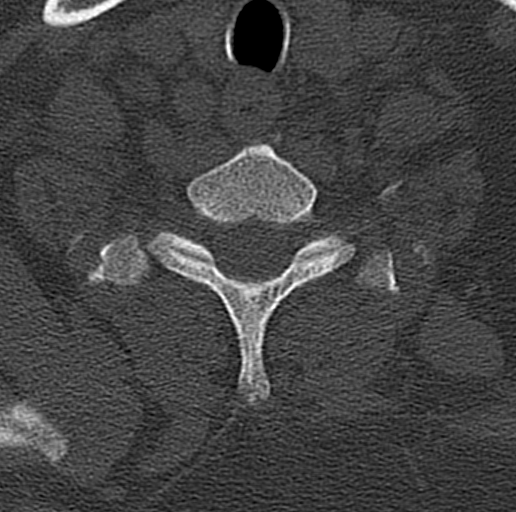
[im 39/97  bone]
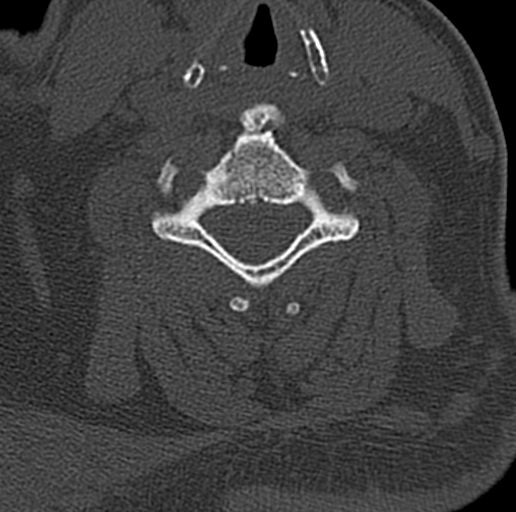
[im 58/97  bone]
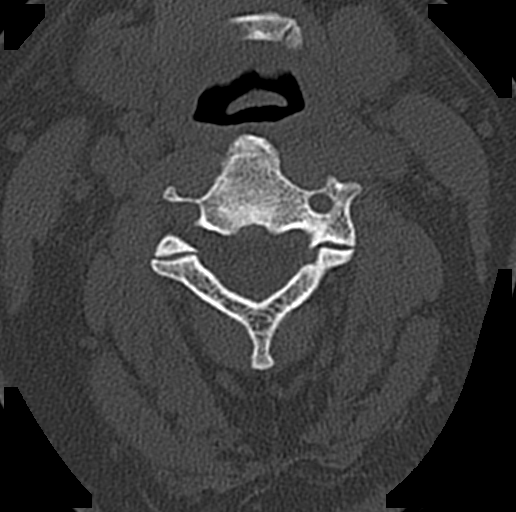
[im 77/97  bone]
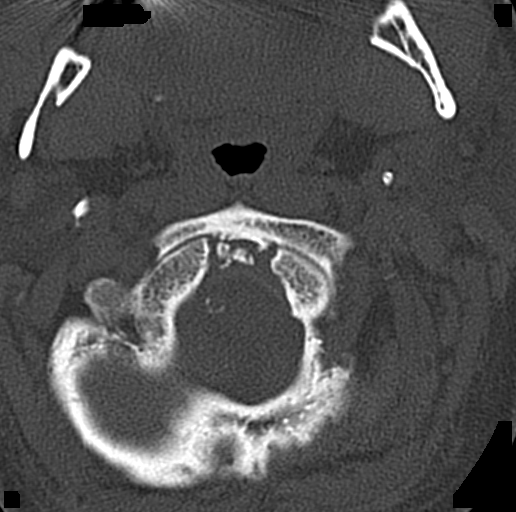

[14 of 33 positions shown; findings below may reference images not displayed]

FINDINGS: CT HEAD FINDINGS

Brain: No evidence of acute infarction, hemorrhage, hydrocephalus,
extra-axial collection or mass lesion/mass effect. Stable small
chronic infarctions within the bilateral lentiform nuclei and
thalami. Stable small cortical infarction in the left parietal lobe.
Stable chronic microvascular ischemic changes and volume loss of the
brain.

Vascular: Calcific atherosclerosis of carotid siphons. No hyperdense
vessel identified.

Skull: Normal. Negative for fracture or focal lesion.

Sinuses/Orbits: No acute finding.

Other: None.

CT CERVICAL SPINE FINDINGS

Alignment: Straightening of cervical lordosis without listhesis.

Skull base and vertebrae: No acute fracture. No primary bone lesion
or focal pathologic process.

Soft tissues and spinal canal: Calcific atherosclerosis of the
carotid systems.

Disc levels: Mild multilevel discogenic degenerative changes with
prominent endplate osteophytes. No significant bony foraminal or
canal stenosis. Productive changes of the anterior C1-2
articulation.

Upper chest: Negative.

Other: Negative.
IMPRESSION: 1. No acute intracranial abnormality or calvarial fracture.
2. No acute fracture or dislocation of cervical spine.
3. Stable chronic microvascular ischemic changes and volume loss of
the brain. Stable small chronic infarcts in the bilateral basal
ganglia and the left parietal lobe.

## 2019-12-07 ENCOUNTER — Inpatient Hospital Stay: Payer: Medicare Other

## 2019-12-07 ENCOUNTER — Encounter: Payer: Self-pay | Admitting: Cardiovascular Disease

## 2019-12-07 ENCOUNTER — Inpatient Hospital Stay (HOSPITAL_BASED_OUTPATIENT_CLINIC_OR_DEPARTMENT_OTHER): Payer: Medicare Other | Admitting: Oncology

## 2019-12-07 ENCOUNTER — Ambulatory Visit (INDEPENDENT_AMBULATORY_CARE_PROVIDER_SITE_OTHER): Payer: Medicare Other | Admitting: Cardiovascular Disease

## 2019-12-07 ENCOUNTER — Encounter: Payer: Self-pay | Admitting: Oncology

## 2019-12-07 VITALS — BP 140/83 | HR 82 | Ht 62.0 in | Wt 153.0 lb

## 2019-12-07 DIAGNOSIS — I25118 Atherosclerotic heart disease of native coronary artery with other forms of angina pectoris: Secondary | ICD-10-CM | POA: Diagnosis not present

## 2019-12-07 DIAGNOSIS — I200 Unstable angina: Secondary | ICD-10-CM

## 2019-12-07 DIAGNOSIS — E785 Hyperlipidemia, unspecified: Secondary | ICD-10-CM | POA: Diagnosis not present

## 2019-12-07 DIAGNOSIS — I10 Essential (primary) hypertension: Secondary | ICD-10-CM

## 2019-12-07 DIAGNOSIS — I5032 Chronic diastolic (congestive) heart failure: Secondary | ICD-10-CM

## 2019-12-07 DIAGNOSIS — I208 Other forms of angina pectoris: Secondary | ICD-10-CM | POA: Diagnosis not present

## 2019-12-07 DIAGNOSIS — D509 Iron deficiency anemia, unspecified: Secondary | ICD-10-CM

## 2019-12-07 MED ORDER — ASPIRIN EC 81 MG PO TBEC: 81.0000 mg | DELAYED_RELEASE_TABLET | Freq: Every day | ORAL | Status: DC

## 2019-12-07 MED ORDER — SODIUM CHLORIDE 0.9% FLUSH: 3.0000 mL | Freq: Two times a day (BID) | INTRAVENOUS | Status: DC

## 2019-12-07 NOTE — Progress Notes (Signed)
Patient denies any concerns today.  

## 2019-12-07 NOTE — Patient Instructions (Signed)
Medication Instructions:  Your physician has recommended you make the following change in your medication:   1) STOP Eliquis 2) START Aspirin 81mg  ONCE daily (over the counter)  *If you need a refill on your cardiac medications before your next appointment, please call your pharmacy*   Lab Work:  Bmet and CBC today.  You will need a COVID test on Monday 12/11/19. Please report to the [name] medical arts drive up test site between 8 AM and 1 PM.  If you have labs (blood work) drawn today and your tests are completely normal, you will receive your results only by: Marland Kitchen MyChart Message (if you have MyChart) OR . A paper copy in the mail If you have any lab test that is abnormal or we need to change your treatment, we will call you to review the results.   Testing/Procedures: Your physician has requested that you have a cardiac catheterization. Cardiac catheterization is used to diagnose and/or treat various heart conditions. Doctors may recommend this procedure for a number of different reasons. The most common reason is to evaluate chest pain. Chest pain can be a symptom of coronary artery disease (CAD), and cardiac catheterization can show whether plaque is narrowing or blocking your heart's arteries. This procedure is also used to evaluate the valves, as well as measure the blood flow and oxygen levels in different parts of your heart. For further information please visit [email or web address]. Please follow instruction sheet, as given.   Follow-Up: At [facility], you and your health needs are our priority.  As part of our continuing mission to provide you with exceptional heart care, we have created designated Provider Care Teams.  These Care Teams include your primary Cardiologist (physician) and Advanced Practice Providers (APPs -  Physician Assistants and Nurse Practitioners) who all work together to provide you with the care you need, when you need it.  We recommend signing up for the  patient portal called "MyChart".  Sign up information is provided on this After Visit Summary.  MyChart is used to connect with patients for Virtual Visits (Telemedicine).  Patients are able to view lab/test results, encounter notes, upcoming appointments, etc.  Non-urgent messages can be sent to your provider as well.   To learn more about what you can do with MyChart, go to [email or web address].    Your next appointment:   2 week(s)  The format for your next appointment:   In Person  Provider:   With an APP   Other Instructions     [place] [address] [place] [address] Dept: [phone] Loc: [place]  12/07/2019  You are scheduled for a Cardiac Catheterization on Wednesday, November 10 with Dr. Kathlyn Sacramento.  1. Please arrive at the [facility] (Main Entrance A) at [facility]: [address] [place], [place] [address] at 8:00 AM (This time is two hours before your procedure to ensure your preparation). Free valet parking service is available.   Special note: Every effort is made to have your procedure done on time. Please understand that emergencies sometimes delay scheduled procedures.  2. Diet: Do not eat solid foods after midnight.  The patient may have clear liquids until 5am upon the day of the procedure.  3. Labs: You will need to have a COVID test completed on Monday, November 8 at [facility] drive up test site between 8AM and 1PM.  4. Medication  instructions in preparation for your procedure:   Contrast Allergy: No  On the morning of your procedure, take your Aspirin and Plavix and any morning medicines NOT listed above.  You may use sips of water.  5. Plan for one night stay--bring personal belongings. 6. Bring a current list of your medications and current insurance cards. 7. You MUST have a responsible person to  drive you home. 8. Someone MUST be with you the first 24 hours after you arrive home or your discharge will be delayed. 9. Please wear clothes that are easy to get on and off and wear slip-on shoes.  Thank you for allowing [place] to care for you!   -- [facility]

## 2019-12-07 NOTE — Progress Notes (Signed)
Cardiology Office Note   Date:  12/07/2019   ID:  [place], [place] 09/13/1970, MRN [id number]  PCP:  [name], MD  Cardiologist:  Dr. [name]  Chief Complaint  Patient presents with  . office visit    Hospital F/U-chest pain and syncope; Meds verbally reviewed with patient.      History of Present Illness: [name] is a 49 y.o. female who presents for evaluation of coronary artery disease and possible need of repeat catheterization and PCI. She has known history of coronary artery disease status post PCI to the LAD with drug-eluting stent placement.  She had multiple balloon angioplasty on ostial first diagonal due to recurrent restenosis.  The location was not suitable for stent placement without jeopardizing the LAD given the angulation.  She had previously mildly reduced LV systolic function with subsequent improvement.  She has multiple chronic medical conditions that include stage III chronic kidney disease, prior CVA, left carotid to subclavian artery bypass, aortic arch aneurysm, anemia, obesity status post gastric bypass, pulmonary embolism, bipolar disorder, type 2 diabetes, essential hypertension, hyperlipidemia and frequent falls with syncope and presyncope. The patient suffers from chronic angina.  Escalation of antianginal therapy has been limited by hypotension and side effects.  She is known to have small vessel disease.  She reports improvement in anginal symptoms after previous balloon angioplasty of the diagonal.  However, she had recurrent symptoms with restenosis. She had recurrent hospitalizations for hypoglycemia. She was hospitalized in October with chest pain.  D-dimer was elevated and thus she underwent a VQ scan that was suggestive of right upper lobe pulmonary embolism.  She was started on anticoagulation with Eliquis. However, she had a recent subsequent CTA of the chest that showed no evidence of pulmonary embolism. She continues  to struggle with almost daily angina and the patient continues to be very frustrated with her symptoms.  This is in spite of taking multiple antianginal medications.  Past Medical History:  Diagnosis Date  . Acute colitis 01/27/2017  . Acute pyelonephritis   . Anemia    iron deficiency anemia  . Aortic arch aneurysm ([place])   . BRCA negative 2014  . CAD (coronary artery disease)    a. 08/2003 Cath: LAD 30-40-med Rx; b. 11/2014 PCI: LAD 35m(3.25x23 Xience Alpine DES); c. 06/2015 PCI: D1 (2.25x12 Resolute Integrity DES); d. 06/2017 PCI: Patent mLAD stent, D2 95 (PTCA); e. 09/2017 PCI: D2 99ost (CBA); d. 12/2017 Cath: LM nl, LAD 326m80d (small), D1 40ost, D2 95ost, LCX 40p, RCA 40ost/p->Med rx for D2 given restenosis.  . Colitis 06/03/2015  . Colon polyp   . CVA (cerebral vascular accident) ([place]   Left side weakness.   . Degenerative tear of glenoid labrum of right shoulder 03/15/2017  . Diabetes mellitus without complication (HCM[place]  . Family history of breast cancer    BRCA neg 2014  . Femur fracture, left (HCR[place]9/10/2018  . Gastric ulcer 04/27/2011  . History of echocardiogram    a. 03/2017 Echo: EF 60-65%, no rwma; b. 02/2018 Echo: EF 60-65%, no rwma. Nl RV fxn. No cardiac source of emboli (admitted w/ stroke).  . Hypertension   . Malignant melanoma of skin of scalp (HCRedby  . MI, acute, non ST segment elevation (HCGann [place]  . Neuromuscular disorder (HC[place]land [place]  . S/P drug eluting coronary stent placement 06/04/2015  . Sepsis (HCPut[place]2/14/2019  . [facility]   a. 02/2018 MRI:  65m late acute/early subacute L medial frontal lobe inarct; b. 02/2018 MRA No large vessel occlusion or aneurysm. Mod to sev L P2 stenosis. thready L vertebral artery, diffusely dzs'd; c. 02/2018 Carotid U/S: <50% bilat ICA dzs.    Past Surgical History:  Procedure Laterality Date  . APPENDECTOMY    . CARDIAC CATHETERIZATION N/A 11/09/2014   Procedure: Coronary Angiography;  Surgeon: TMinna Merritts MD;  [place] [place] [facility];  Service: Cardiovascular;  Laterality: N/A;  . CARDIAC CATHETERIZATION N/A 11/12/2014   Procedure: Coronary Stent Intervention;  Surgeon: AIsaias Cowman MD;  [place];  Service: Cardiovascular;  Laterality: N/A;  . CARDIAC CATHETERIZATION N/A 04/18/2015   Procedure: Left Heart Cath and Coronary Angiography;  Surgeon: TMinna Merritts MD;  Location: [facility];  Service: Cardiovascular;  Laterality: N/A;  . CARDIAC CATHETERIZATION Left 06/04/2015   Procedure: Left Heart Cath and Coronary Angiography;  Surgeon: MWellington Hampshire MD;  Location: [facility] LAB;  Service: Cardiovascular;  Laterality: Left;  . CARDIAC CATHETERIZATION N/A 06/04/2015   Procedure: Coronary Stent Intervention;  Surgeon: MWellington Hampshire MD;  [place];  Service: Cardiovascular;  Laterality: N/A;  . CESAREAN SECTION  2001  . CHOLECYSTECTOMY N/A 11/18/2016   Procedure: LAPAROSCOPIC CHOLECYSTECTOMY WITH INTRAOPERATIVE CHOLANGIOGRAM;  Surgeon: SChristene Lye MD;  [facility];  Service: General;  Laterality: N/A;  . COLONOSCOPY WITH PROPOFOL N/A 04/27/2016   Procedure: COLONOSCOPY WITH PROPOFOL;  Surgeon: DLucilla Lame MD;  Location: [place]  Service: Endoscopy;  Laterality: N/A;  . COLONOSCOPY WITH PROPOFOL N/A 01/12/2018   Procedure: COLONOSCOPY WITH PROPOFOL;  Surgeon: Toledo, TBenay Pike MD;  Location: [facility];  Service: Endoscopy;  Laterality: N/A;  . CORONARY ANGIOPLASTY    . CORONARY BALLOON ANGIOPLASTY N/A 06/29/2017   Procedure: CORONARY BALLOON ANGIOPLASTY;  Surgeon: AWellington Hampshire MD;  [place] [facility];  Service: Cardiovascular;  Laterality: N/A;  . CORONARY BALLOON ANGIOPLASTY N/A 09/20/2017   Procedure: CORONARY BALLOON ANGIOPLASTY;  Surgeon: AWellington Hampshire MD;  [place] [facility];  Service: Cardiovascular;  Laterality: N/A;  . DILATION AND CURETTAGE OF UTERUS    .  ESOPHAGOGASTRODUODENOSCOPY (EGD) WITH PROPOFOL N/A 09/14/2014   Procedure: ESOPHAGOGASTRODUODENOSCOPY (EGD) WITH PROPOFOL;  Surgeon: MJosefine Class MD;  [facility];  Service: Endoscopy;  Laterality: N/A;  . ESOPHAGOGASTRODUODENOSCOPY (EGD) WITH PROPOFOL N/A 04/27/2016   Procedure: ESOPHAGOGASTRODUODENOSCOPY (EGD) WITH PROPOFOL;  Surgeon: DLucilla Lame MD;  [place]  Service: Endoscopy;  Laterality: N/A;  Diabetic - oral meds  . ESOPHAGOGASTRODUODENOSCOPY (EGD) WITH PROPOFOL N/A 01/12/2018   Procedure: ESOPHAGOGASTRODUODENOSCOPY (EGD) WITH PROPOFOL;  Surgeon: Toledo, TBenay Pike MD;  [facility];  Service: Endoscopy;  Laterality: N/A;  . ESOPHAGOGASTRODUODENOSCOPY (EGD) WITH PROPOFOL N/A 04/11/2019   Procedure: ESOPHAGOGASTRODUODENOSCOPY (EGD) WITH PROPOFOL;  Surgeon: WLucilla Lame MD;  [facility]: [facility];  Service: Endoscopy;  Laterality: N/A;  . GASTRIC BYPASS  09/2009   WW[place](IM) NAIL INTERTROCHANTERIC Left 10/13/2018   Procedure: INTRAMEDULLARY (IM) NAIL INTERTROCHANTRIC;  Surgeon: XLeandrew Koyanagi MD;  Location: [place]  Service: Orthopedics;  Laterality: Left;  . Left Carotid to sublcavian artery bypass w/ subclavian artery ligation     a. Performed @ [place]  . LEFT HEART CATH AND CORONARY ANGIOGRAPHY Left 06/29/2017   Procedure: LEFT HEART CATH AND CORONARY ANGIOGRAPHY;  Surgeon: AWellington Hampshire MD;  Location: [place]  [facility] LAB;  Service: Cardiovascular;  Laterality: Left;  . LEFT HEART CATH AND CORONARY ANGIOGRAPHY N/A 09/20/2017   Procedure: LEFT HEART CATH AND CORONARY ANGIOGRAPHY;  Surgeon: Wellington Hampshire, MD;  Location: [place] CV LAB;  Service: Cardiovascular;  Laterality: N/A;  . LEFT HEART CATH AND CORONARY ANGIOGRAPHY N/A 12/20/2017   Procedure: LEFT HEART CATH AND CORONARY ANGIOGRAPHY;  Surgeon: Wellington Hampshire, MD;  Location: [place] CV LAB;  Service: Cardiovascular;   Laterality: N/A;  . LEFT HEART CATH AND CORONARY ANGIOGRAPHY N/A 04/20/2019   Procedure: LEFT HEART CATH AND CORONARY ANGIOGRAPHY possible PCI;  Surgeon: Minna Merritts, MD;  Location: [place] CV LAB;  Service: Cardiovascular;  Laterality: N/A;  . MELANOMA EXCISION  2016   Dr. Evorn Gong  . [place]  2002  . RIGHT OOPHORECTOMY    . SHOULDER ARTHROSCOPY WITH OPEN ROTATOR CUFF REPAIR Right 01/07/2016   Procedure: SHOULDER ARTHROSCOPY WITH DEBRIDMENT, SUBACHROMIAL DECOMPRESSION;  Surgeon: Corky Mull, MD;  Location: [facility];  Service: Orthopedics;  Laterality: Right;  . SHOULDER ARTHROSCOPY WITH OPEN ROTATOR CUFF REPAIR Right 03/16/2017   Procedure: SHOULDER ARTHROSCOPY WITH OPEN ROTATOR CUFF REPAIR POSSIBLE BICEPS TENODESIS;  Surgeon: Corky Mull, MD;  Location: [facility];  Service: Orthopedics;  Laterality: Right;  . TRIGGER FINGER RELEASE Right     Middle Finger     Current Outpatient Medications  Medication Sig Dispense Refill  . ALPRAZolam (XANAX) 1 MG tablet Take 1 mg by mouth at bedtime as needed for anxiety.    Marland Kitchen amLODipine (NORVASC) 2.5 MG tablet Take 2.5 mg by mouth at bedtime.    Marland Kitchen buPROPion (WELLBUTRIN SR) 200 MG 12 hr tablet Take 200 mg by mouth every morning.    . carvedilol (COREG) 12.5 MG tablet Take 1 tablet (12.5 mg total) by mouth 2 (two) times daily with a meal. 60 tablet 3  . clopidogrel (PLAVIX) 75 MG tablet Take 1 tablet by mouth once daily 30 tablet 0  . EUTHYROX 25 MCG tablet TAKE 1 TABLET BY MOUTH ONCE DAILY BEFORE BREAKFAST 90 tablet 3  . FIBER COMPLETE PO Take 5 mg by mouth daily as needed.     . gabapentin (NEURONTIN) 300 MG capsule Take 1 capsule (300 mg total) by mouth 2 (two) times daily. 180 capsule 4  . isosorbide mononitrate (IMDUR) 120 MG 24 hr tablet Take 1 tablet (120 mg total) by mouth daily. 30 tablet 3  . Multiple Vitamin (MULTIVITAMIN) capsule Take 1 capsule by mouth daily. gummies    . nitroGLYCERIN (NITROSTAT) 0.4 MG SL tablet Place  1 tablet (0.4 mg total) under the tongue every 5 (five) minutes as needed for chest pain. 25 tablet 2  . pantoprazole (PROTONIX) 40 MG tablet Take 40 mg by mouth daily.    . promethazine (PHENERGAN) 25 MG tablet Take 25 mg by mouth every 6 (six) hours as needed for nausea or vomiting.     . rosuvastatin (CRESTOR) 40 MG tablet Take 1 tablet (40 mg total) by mouth daily at 6 PM. 90 tablet 3  . traZODone (DESYREL) 100 MG tablet TAKE 1 TO 2 TABLETS BY MOUTH AT BEDTIME 30 tablet 5  . UBRELVY 100 MG TABS Take 1 tablet by mouth 2 (two) times daily as needed (migraine).     . ALPRAZolam (XANAX) 1 MG tablet Take 1 tablet by mouth twice daily as needed (Patient taking differently: Take 1 mg by mouth 2 (two) times daily as needed for anxiety. ) 60  tablet 3  . amLODipine (NORVASC) 2.5 MG tablet Take 1 tablet (2.5 mg total) by mouth daily. (Patient taking differently: Take 2.5 mg by mouth every evening. ) 90 tablet 3  . aspirin EC 81 MG tablet Take 1 tablet (81 mg total) by mouth daily. Swallow whole.    Marland Kitchen buPROPion (WELLBUTRIN XL) 300 MG 24 hr tablet Take 1 tablet by mouth once daily (Patient taking differently: Take 300 mg by mouth daily. ) 90 tablet 1  . pantoprazole (PROTONIX) 40 MG tablet Take 1 tablet by mouth twice daily (Patient taking differently: Take 40 mg by mouth 2 (two) times daily. ) 180 tablet 1  . Psyllium (METAMUCIL FIBER PO) Take 5 g by mouth daily. gummies    . venlafaxine XR (EFFEXOR-XR) 150 MG 24 hr capsule Take 1 capsule (150 mg total) by mouth daily with breakfast. 30 capsule 5   No current facility-administered medications for this visit.    Allergies:   Lipitor [atorvastatin] and Tramadol    Social History:  The patient  reports that she quit smoking about 25 years ago. Her smoking use included cigarettes. She has never used smokeless tobacco. She reports that she does not drink alcohol and does not use drugs.   Family History:  The patient's family history includes Anxiety  disorder in her father and mother; Bipolar disorder in her mother; Breast cancer in an other family member; Breast cancer (age of onset: 59) in her maternal aunt and maternal aunt; Colon cancer in her cousin and father; Depression in her father, mother, and sister; Diabetes in her father and sister; Heart disease in her mother; Heart disease (age of onset: 55) in her father; Hyperlipidemia in her mother and sister; Hypertension in her father, mother, sister, and sister; Kidney cancer in her cousin; Kidney disease in her father and sister; Ovarian cancer in her cousin; Skin cancer in her father; Stroke in her father; Thyroid nodules in her sister.    ROS:  Please see the history of present illness.   Otherwise, review of systems are positive for none.   All other systems are reviewed and negative.    PHYSICAL EXAM: VS:  BP 140/83 (BP Location: Left Arm, Patient Position: Sitting, Cuff Size: Normal)   Pulse 82   Ht _0  (1.575 m)   Wt 153 lb (69.4 kg)   SpO2 96%   BMI 27.98 kg/m  , BMI Body mass index is 27.98 kg/m. GEN: Well nourished, well developed, in no acute distress  HEENT: normal  Neck: no JVD, carotid bruits, or masses Cardiac: RRR; no murmurs, rubs, or gallops,no edema  Respiratory:  clear to auscultation bilaterally, normal work of breathing GI: soft, nontender, nondistended, + BS MS: no deformity or atrophy  Skin: warm and dry, no rash Neuro:  Strength and sensation are intact Psych: euthymic mood, full affect Radial pulses slightly diminished on the right and normal on the left.  EKG:  EKG is ordered today. The ekg ordered today demonstrates normal sinus rhythm with nonspecific T wave changes.   Recent Labs: 10/17/2019: ALT 22 10/18/2019: TSH 1.544 10/20/2019: Magnesium 2.1 11/05/2019: B Natriuretic Peptide 24.9 11/18/2019: BUN 25; Creatinine, Ser 2.24; Potassium 5.0; Sodium 134 12/06/2019: Hemoglobin 11.2; Platelets 172    Lipid Panel    Component Value Date/Time    CHOL 94 11/06/2019 0519   CHOL 159 05/28/2016 1212   CHOL 197 12/30/2013 0355   TRIG 84 11/06/2019 0519   TRIG 261 (H) 12/30/2013 0355   HDL  29 (L) 11/06/2019 0519   HDL 34 (L) 05/28/2016 1212   HDL 28 (L) 12/30/2013 0355   CHOLHDL 3.2 11/06/2019 0519   VLDL 17 11/06/2019 0519   VLDL 52 (H) 12/30/2013 0355   LDLCALC 48 11/06/2019 0519   LDLCALC 103 (H) 12/08/2016 1221   LDLCALC 117 (H) 12/30/2013 0355   LDLDIRECT 83.3 01/26/2011 0913      Wt Readings from Last 3 Encounters:  12/07/19 153 lb (69.4 kg)  11/28/19 153 lb (69.4 kg)  11/18/19 156 lb 1.4 oz (70.8 kg)       No flowsheet data found.    ASSESSMENT AND PLAN:  1.  Coronary artery disease involving native coronary arteries with stable angina: The patient has class III angina in spite of maximal antianginal therapy and she is very frustrated with her symptoms.  She is known to have severe in-stent restenosis in the diagonal.  In addition, there was moderate stenosis in the LAD at the bifurcation.  Given continued symptoms, the patient wants revascularization performed.  We will go ahead and schedule her for left heart catheterization possible PCI.  I discussed the procedure in details as well as risk and benefits.  The biggest risk is going to be contrast-induced nephropathy given underlying chronic kidney disease but the patient is already aware of this risk and she wants to proceed anyway.  We will plan on hydrating her 4 hours before the procedure.  We will try to minimize contrast.  2.  Chronic diastolic heart failure: She appears to be euvolemic.  3.  Recent questionable pulmonary embolism on VQ scan: A follow-up CTA showed no evidence of pulmonary embolism.  I discontinued Eliquis given that she will likely require coronary stenting next week.  I added aspirin 81 mg daily.  4, recurrent syncope with no clear arrhythmia.  5.  Chronic kidney disease stage III  6.  Essential hypertension with intermittent hypotension:  Blood pressure is reasonably controlled today.  7.  Hyperlipidemia: Continue high-dose rosuvastatin.  Most recent lipid profile showed an LDL of 48.   Disposition:   FU with me in 1 month  Signed,  Kathlyn Sacramento, MD  12/07/2019 11:04 AM    [place]

## 2019-12-08 LAB — BASIC METABOLIC PANEL
BUN/Creatinine Ratio: 8 — ABNORMAL LOW (ref 9–23)
BUN: 14 mg/dL (ref 6–24)
CO2: 23 mmol/L (ref 20–29)
Calcium: 8.8 mg/dL (ref 8.7–10.2)
Chloride: 107 mmol/L — ABNORMAL HIGH (ref 96–106)
Creatinine, Ser: 1.84 mg/dL — ABNORMAL HIGH (ref 0.57–1.00)
GFR calc Af Amer: 36 mL/min/{1.73_m2} — ABNORMAL LOW (ref >59)
GFR calc non Af Amer: 31 mL/min/{1.73_m2} — ABNORMAL LOW (ref >59)
Glucose: 100 mg/dL — ABNORMAL HIGH (ref 65–99)
Potassium: 3.8 mmol/L (ref 3.5–5.2)
Sodium: 141 mmol/L (ref 134–144)

## 2019-12-08 LAB — CBC WITH DIFFERENTIAL/PLATELET
Basophils Absolute: 0.1 10*3/uL (ref 0.0–0.2)
Basos: 2 %
EOS (ABSOLUTE): 0.3 10*3/uL (ref 0.0–0.4)
Eos: 4 %
Hematocrit: 35.5 % (ref 34.0–46.6)
Hemoglobin: 11.6 g/dL (ref 11.1–15.9)
Immature Grans (Abs): 0 10*3/uL (ref 0.0–0.1)
Immature Granulocytes: 0 %
Lymphocytes Absolute: 1.3 10*3/uL (ref 0.7–3.1)
Lymphs: 20 %
MCH: 26.9 pg (ref 26.6–33.0)
MCHC: 32.7 g/dL (ref 31.5–35.7)
MCV: 82 fL (ref 79–97)
Monocytes Absolute: 0.4 10*3/uL (ref 0.1–0.9)
Monocytes: 5 %
Neutrophils Absolute: 4.6 10*3/uL (ref 1.4–7.0)
Neutrophils: 69 %
Platelets: 188 10*3/uL (ref 150–450)
RBC: 4.31 x10E6/uL (ref 3.77–5.28)
RDW: 13 % (ref 11.7–15.4)
WBC: 6.6 10*3/uL (ref 3.4–10.8)

## 2019-12-11 ENCOUNTER — Other Ambulatory Visit: Payer: Self-pay

## 2019-12-11 ENCOUNTER — Other Ambulatory Visit
Admission: RE | Admit: 2019-12-11 | Discharge: 2019-12-11 | Disposition: A | Discharge status: Home / Self Care | Payer: Medicare Other | Source: Ambulatory Visit | Attending: Cardiovascular Disease | Admitting: Cardiovascular Disease

## 2019-12-11 DIAGNOSIS — F410 Panic disorder [episodic paroxysmal anxiety] without agoraphobia: Secondary | ICD-10-CM | POA: Diagnosis not present

## 2019-12-11 DIAGNOSIS — F411 Generalized anxiety disorder: Secondary | ICD-10-CM | POA: Diagnosis not present

## 2019-12-11 DIAGNOSIS — F3181 Bipolar II disorder: Secondary | ICD-10-CM | POA: Diagnosis not present

## 2019-12-11 DIAGNOSIS — Z20822 Contact with and (suspected) exposure to covid-19: Secondary | ICD-10-CM | POA: Diagnosis not present

## 2019-12-11 DIAGNOSIS — Z01818 Encounter for other preprocedural examination: Secondary | ICD-10-CM | POA: Insufficient documentation

## 2019-12-12 ENCOUNTER — Inpatient Hospital Stay (HOSPITAL_COMMUNITY)
Admission: EM | Admit: 2019-12-12 | Discharge: 2019-12-14 | DRG: 247 | Disposition: A | Discharge status: Home / Self Care | Payer: Medicare Other | Attending: Cardiology | Admitting: Cardiology

## 2019-12-12 ENCOUNTER — Other Ambulatory Visit: Payer: Self-pay

## 2019-12-12 ENCOUNTER — Telehealth: Payer: Self-pay | Admitting: *Deleted

## 2019-12-12 ENCOUNTER — Other Ambulatory Visit (HOSPITAL_COMMUNITY): Payer: Self-pay

## 2019-12-12 ENCOUNTER — Encounter (HOSPITAL_COMMUNITY): Payer: Self-pay | Admitting: Emergency Medicine

## 2019-12-12 ENCOUNTER — Emergency Department (HOSPITAL_COMMUNITY): Payer: Medicare Other

## 2019-12-12 DIAGNOSIS — Y712 Prosthetic and other implants, materials and accessory cardiovascular devices associated with adverse incidents: Secondary | ICD-10-CM | POA: Diagnosis present

## 2019-12-12 DIAGNOSIS — N184 Chronic kidney disease, stage 4 (severe): Secondary | ICD-10-CM | POA: Diagnosis present

## 2019-12-12 DIAGNOSIS — Z800 Family history of malignant neoplasm of digestive organs: Secondary | ICD-10-CM

## 2019-12-12 DIAGNOSIS — I200 Unstable angina: Secondary | ICD-10-CM

## 2019-12-12 DIAGNOSIS — I25110 Atherosclerotic heart disease of native coronary artery with unstable angina pectoris: Secondary | ICD-10-CM | POA: Diagnosis present

## 2019-12-12 DIAGNOSIS — E1122 Type 2 diabetes mellitus with diabetic chronic kidney disease: Secondary | ICD-10-CM | POA: Diagnosis present

## 2019-12-12 DIAGNOSIS — Z9049 Acquired absence of other specified parts of digestive tract: Secondary | ICD-10-CM

## 2019-12-12 DIAGNOSIS — Z8249 Family history of ischemic heart disease and other diseases of the circulatory system: Secondary | ICD-10-CM

## 2019-12-12 DIAGNOSIS — Z7902 Long term (current) use of antithrombotics/antiplatelets: Secondary | ICD-10-CM

## 2019-12-12 DIAGNOSIS — I130 Hypertensive heart and chronic kidney disease with heart failure and stage 1 through stage 4 chronic kidney disease, or unspecified chronic kidney disease: Secondary | ICD-10-CM | POA: Diagnosis not present

## 2019-12-12 DIAGNOSIS — Z9884 Bariatric surgery status: Secondary | ICD-10-CM

## 2019-12-12 DIAGNOSIS — I255 Ischemic cardiomyopathy: Secondary | ICD-10-CM | POA: Diagnosis present

## 2019-12-12 DIAGNOSIS — Z841 Family history of disorders of kidney and ureter: Secondary | ICD-10-CM

## 2019-12-12 DIAGNOSIS — E785 Hyperlipidemia, unspecified: Secondary | ICD-10-CM | POA: Diagnosis present

## 2019-12-12 DIAGNOSIS — Z8041 Family history of malignant neoplasm of ovary: Secondary | ICD-10-CM

## 2019-12-12 DIAGNOSIS — E039 Hypothyroidism, unspecified: Secondary | ICD-10-CM | POA: Diagnosis present

## 2019-12-12 DIAGNOSIS — Z888 Allergy status to other drugs, medicaments and biological substances status: Secondary | ICD-10-CM

## 2019-12-12 DIAGNOSIS — Z8051 Family history of malignant neoplasm of kidney: Secondary | ICD-10-CM

## 2019-12-12 DIAGNOSIS — Z79899 Other long term (current) drug therapy: Secondary | ICD-10-CM

## 2019-12-12 DIAGNOSIS — T82855A Stenosis of coronary artery stent, initial encounter: Principal | ICD-10-CM | POA: Diagnosis present

## 2019-12-12 DIAGNOSIS — Z818 Family history of other mental and behavioral disorders: Secondary | ICD-10-CM

## 2019-12-12 DIAGNOSIS — Z8673 Personal history of transient ischemic attack (TIA), and cerebral infarction without residual deficits: Secondary | ICD-10-CM

## 2019-12-12 DIAGNOSIS — Z83438 Family history of other disorder of lipoprotein metabolism and other lipidemia: Secondary | ICD-10-CM

## 2019-12-12 DIAGNOSIS — J90 Pleural effusion, not elsewhere classified: Secondary | ICD-10-CM | POA: Diagnosis not present

## 2019-12-12 DIAGNOSIS — F319 Bipolar disorder, unspecified: Secondary | ICD-10-CM | POA: Diagnosis present

## 2019-12-12 DIAGNOSIS — Z7901 Long term (current) use of anticoagulants: Secondary | ICD-10-CM

## 2019-12-12 DIAGNOSIS — Z803 Family history of malignant neoplasm of breast: Secondary | ICD-10-CM

## 2019-12-12 DIAGNOSIS — I208 Other forms of angina pectoris: Secondary | ICD-10-CM

## 2019-12-12 DIAGNOSIS — Z833 Family history of diabetes mellitus: Secondary | ICD-10-CM

## 2019-12-12 DIAGNOSIS — R079 Chest pain, unspecified: Secondary | ICD-10-CM | POA: Diagnosis present

## 2019-12-12 DIAGNOSIS — I10 Essential (primary) hypertension: Secondary | ICD-10-CM | POA: Diagnosis not present

## 2019-12-12 DIAGNOSIS — Z85820 Personal history of malignant melanoma of skin: Secondary | ICD-10-CM

## 2019-12-12 DIAGNOSIS — I5032 Chronic diastolic (congestive) heart failure: Secondary | ICD-10-CM | POA: Diagnosis not present

## 2019-12-12 DIAGNOSIS — I252 Old myocardial infarction: Secondary | ICD-10-CM

## 2019-12-12 DIAGNOSIS — Z8711 Personal history of peptic ulcer disease: Secondary | ICD-10-CM

## 2019-12-12 DIAGNOSIS — Z955 Presence of coronary angioplasty implant and graft: Secondary | ICD-10-CM

## 2019-12-12 DIAGNOSIS — Z90721 Acquired absence of ovaries, unilateral: Secondary | ICD-10-CM

## 2019-12-12 DIAGNOSIS — Z87891 Personal history of nicotine dependence: Secondary | ICD-10-CM

## 2019-12-12 DIAGNOSIS — Z808 Family history of malignant neoplasm of other organs or systems: Secondary | ICD-10-CM

## 2019-12-12 DIAGNOSIS — Z885 Allergy status to narcotic agent status: Secondary | ICD-10-CM

## 2019-12-12 DIAGNOSIS — Z823 Family history of stroke: Secondary | ICD-10-CM

## 2019-12-12 LAB — CBC
HCT: 38.7 % (ref 36.0–46.0)
Hemoglobin: 12 g/dL (ref 12.0–15.0)
MCH: 26.3 pg (ref 26.0–34.0)
MCHC: 31 g/dL (ref 30.0–36.0)
MCV: 84.9 fL (ref 80.0–100.0)
Platelets: 183 10*3/uL (ref 150–400)
RBC: 4.56 MIL/uL (ref 3.87–5.11)
RDW: 13.2 % (ref 11.5–15.5)
WBC: 6 10*3/uL (ref 4.0–10.5)
nRBC: 0 % (ref 0.0–0.2)

## 2019-12-12 LAB — BASIC METABOLIC PANEL
Anion gap: 10 (ref 5–15)
BUN: 19 mg/dL (ref 6–20)
CO2: 23 mmol/L (ref 22–32)
Calcium: 9.2 mg/dL (ref 8.9–10.3)
Chloride: 107 mmol/L (ref 98–111)
Creatinine, Ser: 2.02 mg/dL — ABNORMAL HIGH (ref 0.44–1.00)
GFR, Estimated: 29 mL/min — ABNORMAL LOW (ref >60)
Glucose, Bld: 123 mg/dL — ABNORMAL HIGH (ref 70–99)
Potassium: 3.7 mmol/L (ref 3.5–5.1)
Sodium: 140 mmol/L (ref 135–145)

## 2019-12-12 LAB — I-STAT BETA HCG BLOOD, ED (MC, WL, AP ONLY): I-stat hCG, quantitative: 5.3 m[IU]/mL — ABNORMAL HIGH (ref <5)

## 2019-12-12 LAB — TROPONIN I (HIGH SENSITIVITY)
Troponin I (High Sensitivity): 9 ng/L (ref <18)
Troponin I (High Sensitivity): 14 ng/L (ref <18)

## 2019-12-12 LAB — SARS CORONAVIRUS 2 (TAT 6-24 HRS): SARS Coronavirus 2: NEGATIVE

## 2019-12-12 LAB — MRSA PCR SCREENING: MRSA by PCR: POSITIVE — AB

## 2019-12-12 MED ORDER — MUPIROCIN 2 % EX OINT
1.0000 "application " | TOPICAL_OINTMENT | Freq: Two times a day (BID) | CUTANEOUS | Status: DC
  Administered 2019-12-13 – 2019-12-14 (×4): 1 via NASAL
  Filled 2019-12-12: qty 22

## 2019-12-12 MED ORDER — SODIUM CHLORIDE 0.9% FLUSH
3.0000 mL | Freq: Two times a day (BID) | INTRAVENOUS | Status: DC
  Administered 2019-12-12 – 2019-12-13 (×2): 3 mL via INTRAVENOUS

## 2019-12-12 MED ORDER — ASPIRIN 81 MG PO CHEW
81.0000 mg | CHEWABLE_TABLET | ORAL | Status: AC
  Administered 2019-12-13: 81 mg via ORAL
  Filled 2019-12-12: qty 1

## 2019-12-12 MED ORDER — MORPHINE SULFATE (PF) 4 MG/ML IV SOLN
4.0000 mg | Freq: Once | INTRAVENOUS | Status: AC
  Administered 2019-12-12: 4 mg via INTRAVENOUS
  Filled 2019-12-12: qty 1

## 2019-12-12 MED ORDER — ONDANSETRON HCL 4 MG/2ML IJ SOLN
4.0000 mg | Freq: Once | INTRAMUSCULAR | Status: AC
  Administered 2019-12-12: 4 mg via INTRAVENOUS
  Filled 2019-12-12: qty 2

## 2019-12-12 MED ORDER — SODIUM CHLORIDE 0.9% FLUSH
3.0000 mL | Freq: Two times a day (BID) | INTRAVENOUS | Status: DC
  Administered 2019-12-13: 3 mL via INTRAVENOUS

## 2019-12-12 MED ORDER — SODIUM CHLORIDE 0.9% FLUSH: 3.0000 mL | INTRAVENOUS | Status: DC | PRN

## 2019-12-12 MED ORDER — SODIUM CHLORIDE 0.9 % IV SOLN: INTRAVENOUS | Status: DC

## 2019-12-12 MED ORDER — CHLORHEXIDINE GLUCONATE CLOTH 2 % EX PADS
6.0000 | MEDICATED_PAD | Freq: Every day | CUTANEOUS | Status: DC
  Administered 2019-12-13 – 2019-12-14 (×2): 6 via TOPICAL

## 2019-12-12 MED ORDER — PANTOPRAZOLE SODIUM 40 MG PO TBEC
40.0000 mg | DELAYED_RELEASE_TABLET | Freq: Two times a day (BID) | ORAL | Status: DC
  Administered 2019-12-13 – 2019-12-14 (×3): 40 mg via ORAL
  Filled 2019-12-12 (×3): qty 1

## 2019-12-12 MED ORDER — AMLODIPINE BESYLATE 2.5 MG PO TABS
2.5000 mg | ORAL_TABLET | Freq: Every evening | ORAL | Status: DC
  Administered 2019-12-12 – 2019-12-13 (×2): 2.5 mg via ORAL
  Filled 2019-12-12 (×2): qty 1

## 2019-12-12 MED ORDER — TRAZODONE HCL 50 MG PO TABS
100.0000 mg | ORAL_TABLET | Freq: Every evening | ORAL | Status: DC | PRN
  Administered 2019-12-12 – 2019-12-13 (×2): 200 mg via ORAL
  Filled 2019-12-12 (×2): qty 4

## 2019-12-12 MED ORDER — CLOPIDOGREL BISULFATE 75 MG PO TABS
75.0000 mg | ORAL_TABLET | Freq: Every day | ORAL | Status: DC
  Administered 2019-12-13 – 2019-12-14 (×2): 75 mg via ORAL
  Filled 2019-12-12 (×2): qty 1

## 2019-12-12 MED ORDER — GABAPENTIN 300 MG PO CAPS
300.0000 mg | ORAL_CAPSULE | Freq: Two times a day (BID) | ORAL | Status: DC
  Administered 2019-12-12 – 2019-12-14 (×4): 300 mg via ORAL
  Filled 2019-12-12 (×4): qty 1

## 2019-12-12 MED ORDER — SODIUM CHLORIDE 0.9 % IV SOLN: 250.0000 mL | INTRAVENOUS | Status: DC | PRN

## 2019-12-12 MED ORDER — VENLAFAXINE HCL ER 75 MG PO CP24: 75.0000 mg | ORAL_CAPSULE | Freq: Every day | ORAL | Status: DC

## 2019-12-12 MED ORDER — ASPIRIN EC 81 MG PO TBEC
81.0000 mg | DELAYED_RELEASE_TABLET | Freq: Every day | ORAL | Status: DC
  Administered 2019-12-14: 81 mg via ORAL
  Filled 2019-12-12: qty 1

## 2019-12-12 MED ORDER — NITROGLYCERIN 0.4 MG SL SUBL: 0.4000 mg | SUBLINGUAL_TABLET | SUBLINGUAL | Status: DC | PRN

## 2019-12-12 MED ORDER — CARVEDILOL 12.5 MG PO TABS
12.5000 mg | ORAL_TABLET | Freq: Two times a day (BID) | ORAL | Status: DC
  Administered 2019-12-13 – 2019-12-14 (×3): 12.5 mg via ORAL
  Filled 2019-12-12 (×3): qty 1

## 2019-12-12 MED ORDER — ISOSORBIDE MONONITRATE ER 60 MG PO TB24
120.0000 mg | ORAL_TABLET | Freq: Every day | ORAL | Status: DC
  Administered 2019-12-13 – 2019-12-14 (×2): 120 mg via ORAL
  Filled 2019-12-12 (×2): qty 2

## 2019-12-12 MED ORDER — BUPROPION HCL ER (SR) 100 MG PO TB12
200.0000 mg | ORAL_TABLET | Freq: Every morning | ORAL | Status: DC
  Administered 2019-12-13 – 2019-12-14 (×2): 200 mg via ORAL
  Filled 2019-12-12 (×2): qty 2

## 2019-12-12 MED ORDER — ROSUVASTATIN CALCIUM 20 MG PO TABS
40.0000 mg | ORAL_TABLET | Freq: Every day | ORAL | Status: DC
  Administered 2019-12-12 – 2019-12-13 (×2): 40 mg via ORAL
  Filled 2019-12-12 (×2): qty 2

## 2019-12-12 NOTE — ED Notes (Signed)
Order placed for IV team. Pt a difficult stick.

## 2019-12-12 NOTE — ED Triage Notes (Signed)
Pt coming in for chest pain that has been going on for months. Pt states she took three nitro this morning. VSS. NAD.

## 2019-12-12 NOTE — ED Provider Notes (Signed)
[facility] EMERGENCY DEPARTMENT Provider Note   CSN: [id number] Arrival date & time: 12/12/19  1133     History Chief Complaint  Patient presents with  . Chest Pain    [name] is a 49 y.o. female with past medical history of CAD s/p PCI with DES, CKD presents to the ED for evaluation of chest pain. This is central and left sided radiating into left shoulder, neck.  Today went down back.  Sharp and then pressure feeling.  Onset for months. Frustrated.  States he has chest pain almost daily, constant.  Happening all the time now even while just sitting down watching TV.  Constant for last 2 days. Can't even do laundry. Worse with movement too. Associated with shortness of breath, nausea, vomiting, light-headed, orthopnea and having to sleep on side.  Last episode of chest pain occurred last while in the waiting room.  Does not exert herself anymore because of constant pain. "there all the time". Taking nitroglycerin daily for the last 2-3 days.  She took 3 nitroglycerin today the last dose prior to arrival approximately 3 hours ago and states the pain started to ease off after the third dose currently 7/10, it was a 10/10.  She has a known "clot" in her heart that cardiology tried to treat with medicines but states this is not working.  Saw cardiology last week and has a scheduled LHC tomorrow.  States she cannot wait until then as the chest pain has now become constant 24/7. Recently diagnosed with PE on Eliquis but stopped 1 week ago for pre-op. On plavix and aspirin. Concerned she is dehydrated. Wants LHC done today feels like can't wait anymore.   No fever, coughing.  No leg swelling, calf pain.  No PND.  Vaccinated for COVID-19.  Negative Covid preop test. HPI     Past Medical History:  Diagnosis Date  . Acute colitis 01/27/2017  . Acute pyelonephritis   . Anemia    iron deficiency anemia  . Aortic arch aneurysm ([place])   . BRCA negative 2014  . CAD  (coronary artery disease)    a. 08/2003 Cath: LAD 30-40-med Rx; b. 11/2014 PCI: LAD 62m(3.25x23 Xience Alpine DES); c. 06/2015 PCI: D1 (2.25x12 Resolute Integrity DES); d. 06/2017 PCI: Patent mLAD stent, D2 95 (PTCA); e. 09/2017 PCI: D2 99ost (CBA); d. 12/2017 Cath: LM nl, LAD 378m80d (small), D1 40ost, D2 95ost, LCX 40p, RCA 40ost/p->Med rx for D2 given restenosis.  . Colitis 06/03/2015  . Colon polyp   . CVA (cerebral vascular accident) ([facility]   Left side weakness.   . Degenerative tear of glenoid labrum of right shoulder 03/15/2017  . Diabetes mellitus without complication ([facility]  . Family history of breast cancer    BRCA neg 2014  . Femur fracture, left (HCFairhaven9/10/2018  . Gastric ulcer 04/27/2011  . History of echocardiogram    a. 03/2017 Echo: EF 60-65%, no rwma; b. 02/2018 Echo: EF 60-65%, no rwma. Nl RV fxn. No cardiac source of emboli (admitted w/ stroke).  . Hypertension   . Malignant melanoma of skin of scalp (HC[facility]ars[facility]  . MI, acute, non ST segment elevation (HCFitch[place]  . Neuromuscular disorder (HCHotchkiss  . S/P drug eluting coronary stent placement 06/04/2015  . Sepsis (HCTazewell2/14/2019  . [facility] ([facility]   a. 02/2018 MRI: 8m48mate acute/early subacute L medial frontal lobe inarct; b. 02/2018 MRA No large vessel occlusion or aneurysm. Mod  to sev L P2 stenosis. thready L vertebral artery, diffusely dzs'd; c. 02/2018 Carotid U/S: <50% bilat ICA dzs.    Patient Active Problem List   Diagnosis Date Noted  . Pulmonary embolism ([place]) 11/07/2019  . Chest pain 11/05/2019  . Type II diabetes mellitus with renal manifestations ([place]) 11/05/2019  . Acute renal failure superimposed on stage 3a chronic kidney disease ([place]) 09/02/2019  . Hypoglycemia   . Family history of breast cancer 05/23/2019  . Family history of colon cancer 05/23/2019  . Abnormal LFTs 04/18/2019  . Esophageal dysphagia   . Gastrointestinal tract imaging abnormality   . Trochanteric bursitis, left hip 03/17/2019  . Malignant neoplasm of  vulva, unspecified ([place]) 03/10/2019  . Peripheral vascular disease, unspecified ([place]) 03/10/2019  . Paroxysmal supraventricular tachycardia ([place]) 03/10/2019  . Contusion of left hip 03/06/2019  . Chest pain, moderate coronary artery risk 01/27/2019  . Melena   . Recurrent chest pain 01/19/2019  . Depression, major, single episode, moderate ([place]) 12/09/2018  . Closed nondisplaced intertrochanteric fracture of left femur ([place]) 10/12/2018  . Hypotension 06/26/2018  . Autonomic neuropathy 03/24/2018  . Acute delirium 03/03/2018  . H/O gastric bypass 03/02/2018  . Hypertension associated with stage 3 chronic kidney disease due to type 2 diabetes mellitus ([place]) 02/20/2018  . SI (sacroiliac) joint dysfunction 12/02/2017  . Unstable angina ([place]) 06/24/2017  . Syncope 04/09/2017  . Insomnia 03/18/2017  . Ischemic cardiomyopathy   . Arthritis   . Anxiety   . Tendinitis of upper biceps tendon of right shoulder 03/16/2017  . Hx of colonic polyps   . H/O medication noncompliance 12/14/2015  . CAD (coronary artery disease)   . Hypertensive heart disease   . Status post bariatric surgery 06/04/2015  . Carotid stenosis 04/30/2015  . Stable angina pectoris ([place]) 04/17/2015  . Iron deficiency anemia 03/22/2015  . Vitamin B12 deficiency 02/18/2015  . Misuse of medications for pain 02/18/2015  . Major depressive disorder, recurrent, severe with psychotic features ([place]) 02/15/2015  . Helicobacter pylori infection 11/23/2014  . Hemiparesis, left ([place]) 11/23/2014  . Malignant melanoma ([place]) 08/25/2014  . Chronic systolic CHF (congestive heart failure) ([place])   . Incomplete bladder emptying 07/12/2014  . Hypothyroidism 12/30/2013  . Aberrant subclavian artery 11/17/2013  . Multiple sclerosis ([place]) 11/02/2013  . History of CVA with residual deficit 06/20/2013  . Headache, migraine 05/29/2013  . Hyperlipidemia   . GERD (gastroesophageal reflux disease)   . Neuropathy ([place]) 01/02/2011  . Stroke  ([place]) 06/21/2008  . Depression with anxiety 05/01/2008  . Essential hypertension 05/01/2008    Past Surgical History:  Procedure Laterality Date  . APPENDECTOMY    . CARDIAC CATHETERIZATION N/A 11/09/2014   Procedure: Coronary Angiography;  Surgeon: Minna Merritts, MD;  Location: [facility] CV LAB;  Service: Cardiovascular;  Laterality: N/A;  . CARDIAC CATHETERIZATION N/A 11/12/2014   Procedure: Coronary Stent Intervention;  Surgeon: Isaias Cowman, MD;  Location: [facility] CV LAB;  Service: Cardiovascular;  Laterality: N/A;  . CARDIAC CATHETERIZATION N/A 04/18/2015   Procedure: Left Heart Cath and Coronary Angiography;  Surgeon: Minna Merritts, MD;  Location: [facility] CV LAB;  Service: Cardiovascular;  Laterality: N/A;  . CARDIAC CATHETERIZATION Left 06/04/2015   Procedure: Left Heart Cath and Coronary Angiography;  Surgeon: Wellington Hampshire, MD;  Location: [facility] CV LAB;  Service: Cardiovascular;  Laterality: Left;  . CARDIAC CATHETERIZATION N/A 06/04/2015   Procedure: Coronary Stent Intervention;  Surgeon: Wellington Hampshire, MD;  Location: [facility]  CV LAB;  Service: Cardiovascular;  Laterality: N/A;  . CESAREAN SECTION  2001  . CHOLECYSTECTOMY N/A 11/18/2016   Procedure: LAPAROSCOPIC CHOLECYSTECTOMY WITH INTRAOPERATIVE CHOLANGIOGRAM;  Surgeon: Christene Lye, MD;  Location: [facility] ORS;  Service: General;  Laterality: N/A;  . COLONOSCOPY WITH PROPOFOL N/A 04/27/2016   Procedure: COLONOSCOPY WITH PROPOFOL;  Surgeon: [name], MD;  Location: [facility];  Service: Endoscopy;  Laterality: N/A;  . COLONOSCOPY WITH PROPOFOL N/A 01/12/2018   Procedure: COLONOSCOPY WITH PROPOFOL;  Surgeon: Toledo, Benay Pike, MD;  Location: [facility] ENDOSCOPY;  Service: Endoscopy;  Laterality: N/A;  . CORONARY ANGIOPLASTY    . CORONARY BALLOON ANGIOPLASTY N/A 06/29/2017   Procedure: CORONARY BALLOON ANGIOPLASTY;  Surgeon: Wellington Hampshire, MD;  Location: [facility] CV  LAB;  Service: Cardiovascular;  Laterality: N/A;  . CORONARY BALLOON ANGIOPLASTY N/A 09/20/2017   Procedure: CORONARY BALLOON ANGIOPLASTY;  Surgeon: Wellington Hampshire, MD;  Location: [facility] CV LAB;  Service: Cardiovascular;  Laterality: N/A;  . DILATION AND CURETTAGE OF UTERUS    . ESOPHAGOGASTRODUODENOSCOPY (EGD) WITH PROPOFOL N/A 09/14/2014   Procedure: ESOPHAGOGASTRODUODENOSCOPY (EGD) WITH PROPOFOL;  Surgeon: Josefine Class, MD;  [facility] ENDOSCOPY;  Service: Endoscopy;  Laterality: N/A;  . ESOPHAGOGASTRODUODENOSCOPY (EGD) WITH PROPOFOL N/A 04/27/2016   Procedure: ESOPHAGOGASTRODUODENOSCOPY (EGD) WITH PROPOFOL;  Surgeon: [name], MD;  Location: [facility];  Service: Endoscopy;  Laterality: N/A;  Diabetic - oral meds  . ESOPHAGOGASTRODUODENOSCOPY (EGD) WITH PROPOFOL N/A 01/12/2018   Procedure: ESOPHAGOGASTRODUODENOSCOPY (EGD) WITH PROPOFOL;  Surgeon: Toledo, Benay Pike, MD;  Location: [facility] ENDOSCOPY;  Service: Endoscopy;  Laterality: N/A;  . ESOPHAGOGASTRODUODENOSCOPY (EGD) WITH PROPOFOL N/A 04/11/2019   Procedure: ESOPHAGOGASTRODUODENOSCOPY (EGD) WITH PROPOFOL;  Surgeon: [name], MD;  Location: [facility] ENDOSCOPY;  Service: Endoscopy;  Laterality: N/A;  . GASTRIC BYPASS  09/2009   [place] (IM) NAIL INTERTROCHANTERIC Left 10/13/2018   Procedure: INTRAMEDULLARY (IM) NAIL INTERTROCHANTRIC;  Surgeon: Leandrew Koyanagi, MD;  Location: [facility];  Service: Orthopedics;  Laterality: Left;  . Left Carotid to sublcavian artery bypass w/ subclavian artery ligation     a. Performed @ [facility].  . LEFT HEART CATH AND CORONARY ANGIOGRAPHY Left 06/29/2017   Procedure: LEFT HEART CATH AND CORONARY ANGIOGRAPHY;  Surgeon: Wellington Hampshire, MD;  Location: [facility] CV LAB;  Service: Cardiovascular;  Laterality: Left;  . LEFT HEART CATH AND CORONARY ANGIOGRAPHY N/A 09/20/2017   Procedure: LEFT HEART CATH AND CORONARY ANGIOGRAPHY;  Surgeon: Wellington Hampshire, MD;  Location: [facility] CV LAB;  Service: Cardiovascular;  Laterality: N/A;  . LEFT HEART CATH AND CORONARY ANGIOGRAPHY N/A 12/20/2017   Procedure: LEFT HEART CATH AND CORONARY ANGIOGRAPHY;  Surgeon: Wellington Hampshire, MD;  Location: [facility] CV LAB;  Service: Cardiovascular;  Laterality: N/A;  . LEFT HEART CATH AND CORONARY ANGIOGRAPHY N/A 04/20/2019   Procedure: LEFT HEART CATH AND CORONARY ANGIOGRAPHY possible PCI;  Surgeon: Minna Merritts, MD;  Location: [facility] CV LAB;  Service: Cardiovascular;  Laterality: N/A;  . MELANOMA EXCISION  2016   Dr. Evorn Gong  . [place]  2002  . RIGHT OOPHORECTOMY    . SHOULDER ARTHROSCOPY WITH OPEN ROTATOR CUFF REPAIR Right 01/07/2016   Procedure: SHOULDER ARTHROSCOPY WITH DEBRIDMENT, SUBACHROMIAL DECOMPRESSION;  Surgeon: Corky Mull, MD;  Location: [facility] ORS;  Service: Orthopedics;  Laterality: Right;  . SHOULDER ARTHROSCOPY WITH OPEN ROTATOR CUFF REPAIR Right 03/16/2017   Procedure: SHOULDER ARTHROSCOPY WITH OPEN ROTATOR CUFF  REPAIR POSSIBLE BICEPS TENODESIS;  Surgeon: Corky Mull, MD;  Location: [facility] ORS;  Service: Orthopedics;  Laterality: Right;  . TRIGGER FINGER RELEASE Right     Middle Finger     OB History    Gravida  7   Para  1   Term      Preterm  1   AB  6   Living  1     SAB  6   TAB      Ectopic      Multiple      Live Births  1           Family History  Problem Relation Age of Onset  . Hypertension Mother   . Anxiety disorder Mother   . Depression Mother   . Bipolar disorder Mother   . Heart disease Mother        No details  . Hyperlipidemia Mother   . Kidney disease Father   . Heart disease Father 39  . Hypertension Father   . Diabetes Father   . Stroke Father   . Colon cancer Father        dx in his 10's  . Anxiety disorder Father   . Depression Father   . Skin cancer Father   . Kidney disease Sister   . Thyroid nodules Sister   . Hypertension Sister   .  Hypertension Sister   . Diabetes Sister   . Hyperlipidemia Sister   . Depression Sister   . Breast cancer Maternal Aunt 39  . Breast cancer Maternal Aunt 9  . Ovarian cancer Cousin   . Colon cancer Cousin   . Kidney cancer Cousin   . Breast cancer Other   . Bladder Cancer Neg Hx     Social History   Tobacco Use  . Smoking status: Former Smoker    Types: Cigarettes    Quit date: 08/31/1994    Years since quitting: 25.2  . Smokeless tobacco: Never Used  . Tobacco comment: quit 28 years ago  Vaping Use  . Vaping Use: Never used  Substance Use Topics  . Alcohol use: No    Alcohol/week: 0.0 standard drinks  . Drug use: No    Home Medications Prior to Admission medications   Medication Sig Start Date End Date Taking? Authorizing Provider  ALPRAZolam Duanne Moron) 1 MG tablet Take 1 tablet by mouth twice daily as needed Patient taking differently: Take 1 mg by mouth 2 (two) times daily as needed for anxiety.  08/14/19  Yes [name], MD  amLODipine (NORVASC) 2.5 MG tablet Take 1 tablet (2.5 mg total) by mouth daily. Patient taking differently: Take 2.5 mg by mouth every evening.  10/25/19  Yes Gollan, Kathlene November, MD  aspirin EC 81 MG tablet Take 1 tablet (81 mg total) by mouth daily. Swallow whole. 12/07/19  Yes Wellington Hampshire, MD  Biotin w/ Vitamins C & E (HAIR SKIN & NAILS GUMMIES PO) Take 4 tablets by mouth daily.   Yes [provider]  buPROPion (WELLBUTRIN SR) 200 MG 12 hr tablet Take 200 mg by mouth every morning. 11/17/19  Yes [provider]  carvedilol (COREG) 12.5 MG tablet Take 1 tablet (12.5 mg total) by mouth 2 (two) times daily with a meal. 10/20/19  Yes Dwyane Dee, MD  clopidogrel (PLAVIX) 75 MG tablet Take 1 tablet by mouth once daily Patient taking differently: Take 75 mg by mouth daily.  11/24/19  Yes Loel Dubonnet, NP  EUTHYROX 25 MCG tablet TAKE 1 TABLET BY MOUTH ONCE DAILY BEFORE BREAKFAST Patient taking differently: Take 25 mcg by  mouth daily before breakfast.  12/02/19  Yes [name], MD  FIBER COMPLETE PO Take 3 capsules by mouth daily.    Yes [provider]  gabapentin (NEURONTIN) 300 MG capsule Take 1 capsule (300 mg total) by mouth 2 (two) times daily. 10/12/18  Yes [name], MD  isosorbide mononitrate (IMDUR) 120 MG 24 hr tablet Take 1 tablet (120 mg total) by mouth daily. 10/21/19  Yes Dwyane Dee, MD  Multiple Vitamin (MULTIVITAMIN) capsule Take 1 capsule by mouth daily. gummies   Yes [provider]  nitroGLYCERIN (NITROSTAT) 0.4 MG SL tablet Place 1 tablet (0.4 mg total) under the tongue every 5 (five) minutes as needed for chest pain. 10/20/19  Yes Dwyane Dee, MD  pantoprazole (PROTONIX) 40 MG tablet Take 1 tablet by mouth twice daily Patient taking differently: Take 40 mg by mouth 2 (two) times daily.  06/06/19  Yes [name], MD  Polyethyl Glycol-Propyl Glycol (SYSTANE OP) Place 1 drop into both eyes daily as needed (dry eyes).   Yes [provider]  promethazine (PHENERGAN) 25 MG tablet Take 25 mg by mouth every 6 (six) hours as needed for nausea or vomiting.  07/25/19  Yes [provider]  rosuvastatin (CRESTOR) 40 MG tablet Take 1 tablet (40 mg total) by mouth daily at 6 PM. 10/25/19  Yes Gollan, Kathlene November, MD  traZODone (DESYREL) 100 MG tablet TAKE 1 TO 2 TABLETS BY MOUTH AT BEDTIME Patient taking differently: Take 100-200 mg by mouth at bedtime as needed for sleep.  11/15/19  Yes [name], MD  UBRELVY 100 MG TABS Take 100 mg by mouth 2 (two) times daily as needed (migraine).  09/13/19  Yes [provider]  apixaban (ELIQUIS) 2.5 MG TABS tablet Take 2.5 mg by mouth 2 (two) times daily. On Hold per MD LD on 12-08-19    [provider]  desvenlafaxine (PRISTIQ) 50 MG 24 hr tablet Take 50 mg by mouth daily. 12/11/19   [provider]    Allergies    Lipitor [atorvastatin] and Tramadol  Review of Systems   Review of  Systems  Respiratory: Positive for shortness of breath.   Cardiovascular: Positive for chest pain.  All other systems reviewed and are negative.   Physical Exam Updated Vital Signs BP (!) 160/103   Pulse 76   Temp 97.9 F (36.6 C) (Oral)   Resp 16   Ht '5\' 2"'  (1.575 m)   Wt 69.4 kg   SpO2 100%   BMI 27.98 kg/m   Physical Exam Constitutional:      Appearance: She is well-developed.     Comments: NAD. Non toxic.   HENT:     Head: Normocephalic and atraumatic.     Nose: Nose normal.  Eyes:     General: Lids are normal.     Conjunctiva/sclera: Conjunctivae normal.  Neck:     Trachea: Trachea normal.     Comments: Trachea midline.  Cardiovascular:     Rate and Rhythm: Normal rate and regular rhythm.     Pulses:          Radial pulses are 1+ on the right side and 1+ on the left side.       Dorsalis pedis pulses are 1+ on the right side and 1+ on the left side.     Heart sounds: Normal  heart sounds, S1 normal and S2 normal.     Comments: No murmurs. No LE edema or calf tenderness.  Pulmonary:     Effort: Pulmonary effort is normal.     Breath sounds: Normal breath sounds.  Abdominal:     General: Bowel sounds are normal.     Palpations: Abdomen is soft.     Tenderness: There is no abdominal tenderness.     Comments: No epigastric or upper abdominal tenderness.  Musculoskeletal:     Cervical back: Normal range of motion.  Skin:    General: Skin is warm and dry.     Capillary Refill: Capillary refill takes less than 2 seconds.     Comments: No rash to chest wall  Neurological:     Mental Status: She is alert.     GCS: GCS eye subscore is 4. GCS verbal subscore is 5. GCS motor subscore is 6.     Comments: Sensation and strength intact in upper/lower extremities  Psychiatric:        Speech: Speech normal.        Behavior: Behavior normal.        Thought Content: Thought content normal.     ED Results / Procedures / Treatments   Labs (all labs ordered are listed,  but only abnormal results are displayed) Labs Reviewed  BASIC METABOLIC PANEL - Abnormal; Notable for the following components:      Result Value   Glucose, Bld 123 (*)    Creatinine, Ser 2.02 (*)    GFR, Estimated 29 (*)    All other components within normal limits  I-STAT BETA HCG BLOOD, ED ([facility], [facility], [facility] ONLY) - Abnormal; Notable for the following components:   I-stat hCG, quantitative 5.3 (*)    All other components within normal limits  CBC  TROPONIN I (HIGH SENSITIVITY)  TROPONIN I (HIGH SENSITIVITY)    EKG EKG Interpretation  Date/Time:  Tuesday December 12 2019 11:33:21 EST Ventricular Rate:  101 PR Interval:  164 QRS Duration: 82 QT Interval:  332 QTC Calculation: 430 R Axis:   37 Text Interpretation: Sinus tachycardia ST & T wave abnormality, consider inferolateral ischemia Abnormal ECG Since last tracing rate faster Confirmed by Wandra Arthurs [phone]) on 12/12/2019 3:29:04 PM   Radiology DG Chest 2 View  Result Date: 12/12/2019 CLINICAL DATA:  Chest pain. EXAM: CHEST - 2 VIEW COMPARISON:  11/18/2019. FINDINGS: Similar cardiac silhouette. Right-sided aortic arch with vascular ring, better characterized on prior CT chest. Surgical clips project in the region of the left thoracic inlet. Both lungs are clear. No visible pleural effusions or pneumothorax. No acute osseous abnormality. Cholecystectomy clips. IMPRESSION: No active cardiopulmonary disease. Electronically Signed   By: Margaretha Sheffield MD   On: 12/12/2019 12:24    Procedures Procedures (including critical care time)  Medications Ordered in ED Medications  morphine 4 MG/ML injection 4 mg (has no administration in time range)  ondansetron (ZOFRAN) injection 4 mg (has no administration in time range)    ED Course  I have reviewed the triage vital signs and the nursing notes.  Pertinent labs & imaging results that were available during my care of the patient were reviewed by me and considered in my medical  decision making (see chart for details).  Clinical Course as of Dec 12 1622  Tue Dec 12, 2019  1509 Sinus tachycardia ST & T wave abnormality, consider inferolateral ischemia Abnormal ECG  ED EKG [CG]    Clinical Course User  Index [CG] Arlean Hopping   MDM Rules/Calculators/A&P                          EMR, triage nursing notes reviewed to assist with history and MDM.  Cardiology note from 11/4 reviewed.  Patient is known to have severe in-stent restenosis in the diagonal, moderate stenosis in the LAD at the bifurcation.  Diagnosed with chronic angina refractory to medical management.  LHC scheduled for tomorrow.  ER work-up initiated in triage by triage RN.  Lab work ordered: CBC, BMP, troponin x2, hCG.  Imaging ordered: EKG, chest x-ray.  Cardiac monitoring, continuous pulse oximeter ordered.  Patient is having active 7/10 chest pain, nausea.  Meds ordered: Morphine, Zofran.  ER work-up personally visualized and interpreted.  Labs reviewed-creatinine at baseline in setting of known CKD.  Electrolytes normal.  No leukocytosis.  Normal hemoglobin.  Troponin 9 then 14.  Patient is having active chest pain.  Imaging reviewed-EKG shows sinus tachycardia and nonspecific ST and T wave abnormalities in inferior/lateral leads.  Chest x-ray unremarkable.  Patient without other symptoms to point to another etiology like fever, cough.  No hypervolemia on exam.  She did recently stop Eliquis a week ago for preop but is on Plavix, aspirin.  Doubt recurrent or bigger PE given clinical presentation.  Consults in the ED: Cardiology paged, spoke to [place] who anticipates patient will likely be admitted tonight and have scheduled LHC tomorrow.  Reevaluated patient.  Updated her on current plan of care.  Plan to admit by cardiology. Final Clinical Impression(s) / ED Diagnoses Final diagnoses:  Unstable angina [facility])    Rx / DC Orders ED Discharge Orders    None       Kinnie Feil, PA-C 12/12/19 1624    Drenda Freeze, MD 12/13/19 1255

## 2019-12-12 NOTE — H&P (Addendum)
Cardiology Admission History and Physical:   Patient ID: [name] MRN: [id number]; DOB: December 12, 1970   Admission date: 12/12/2019  Primary Care Provider: [name], MD [facility] Cardiologist: Ida Rogue, MD [place] Electrophysiologist:  None   Chief Complaint:  Chest Pain  Patient Profile:   [name] is a 49 y.o. female with a history of coronary artery disease s/p PCI to LAD with drug-eluting stent placement. Multiple balloon angioplasty on ostial first diagnoal due to recurrent restenosis. HFpEF EF 47-09%, grade I diastolic dysfunction (impaired relaxation) 11/06/19. Other medical history includes stage III chronic kidney disease, prior CVA, left carotid to subclavian artery bypass, aortic arch aneurysm, anemia, obesity status post gastric bypass, pulmonary embolism, bipolar disorder, type 2 diabetes, essential hypertension, hyperlipidemia and frequent falls with syncope and presyncope. Presented to the ED today for evaluation of chest pain.   History of Present Illness:   [name] states she has had chest pain for the past few months that worsened 5 days ago. She states the pain is central in location and describes it a sharp pain that progresses to a heavy pressure. She notes the pain does radiate to her left shoulder and up her neck. Today she endorses the pain going down her back. She also notes left arm pain that is similar to the chest pain, this has been chronic, but worsened 5 days ago as well. She states this pain is similar to chest pain she had when she had her prior MI's. She took 2 nitroglycerins when the pain onset and 2 days after. Yesterday and today she endorses taking 3 nitroglycerins. The eased the pain, but when she woke up this morning the pain was significantly worse. The pain occurs at rest and is worse with movement. She endorses associated shortness of breath with the pain. Has limited ability to perform ADL's because of  the pain and shortness of breath. Associated symptoms of nausea and vomiting today as well as when the pain onset 5 days ago.   She was seen by Dr. Fletcher Anon of cardiology last week for her chronic angina. She has been on antianginal therapy, however, escalation has been limited due to hypotension and side effects. The patient was recently seen for chest pain a month ago, VQ scan was suggestive of right upper lobe pulmonary embolism, she was started on eliquis. However, CTA of the chest was performed on 11/18/19 that was negative for a pulmonary embolism. Eliqus was stopped last week. Per Dr. Tyrell Antonio note the patient requested revascularization due to the debilitating nature of the chest pain. She was scheduled for a catheterization tomorrow, but came to the ED because she was unable to tolerate the chest pain.   Has had associated symptoms of chills, dry cough past 2-3 days. Denies fever, abdominal pain, new leg pain/swelling.    Past Medical History:  Diagnosis Date   Acute colitis 01/27/2017   Acute pyelonephritis    Anemia    iron deficiency anemia   Aortic arch aneurysm ([place])    BRCA negative 2014   CAD (coronary artery disease)    a. 08/2003 Cath: LAD 30-40-med Rx; b. 11/2014 PCI: LAD 33m(3.25x23 Xience Alpine DES); c. 06/2015 PCI: D1 (2.25x12 Resolute Integrity DES); d. 06/2017 PCI: Patent mLAD stent, D2 95 (PTCA); e. 09/2017 PCI: D2 99ost (CBA); d. 12/2017 Cath: LM nl, LAD 312m80d (small), D1 40ost, D2 95ost, LCX 40p, RCA 40ost/p->Med rx for D2 given restenosis.   Colitis 06/03/2015  Colon polyp    CVA (cerebral vascular accident) ([place])    Left side weakness.    Degenerative tear of glenoid labrum of right shoulder 03/15/2017   Diabetes mellitus without complication ([place])    Family history of breast cancer    BRCA neg 2014   Femur fracture, left ([place]) 10/12/2018   Gastric ulcer 04/27/2011   History of echocardiogram    a. 03/2017 Echo: EF 60-65%, no rwma; b. 02/2018 Echo: EF 60-65%, no rwma.  Nl RV fxn. No cardiac source of emboli (admitted w/ stroke).   Hypertension    Malignant melanoma of skin of scalp (HCC)    MI, acute, non ST segment elevation (HCC)    Neuromuscular disorder (HCC)    S/P drug eluting coronary stent placement 06/04/2015   Sepsis ([place]) 03/18/2017   Stroke ([place])    a. 02/2018 MRI: 76m late acute/early subacute L medial frontal lobe inarct; b. 02/2018 MRA No large vessel occlusion or aneurysm. Mod to sev L P2 stenosis. thready L vertebral artery, diffusely dzs'd; c. 02/2018 Carotid U/S: <50% bilat ICA dzs.    Past Surgical History:  Procedure Laterality Date   APPENDECTOMY     CARDIAC CATHETERIZATION N/A 11/09/2014   Procedure: Coronary Angiography;  Surgeon: TMinna Merritts MD;  [facility];  Service: Cardiovascular;  Laterality: N/A;   CARDIAC CATHETERIZATION N/A 11/12/2014   Procedure: Coronary Stent Intervention;  Surgeon: AIsaias Cowman MD;  [facility];  Service: Cardiovascular;  Laterality: N/A;   CARDIAC CATHETERIZATION N/A 04/18/2015   Procedure: Left Heart Cath and Coronary Angiography;  Surgeon: TMinna Merritts MD;  [facility];  Service: Cardiovascular;  Laterality: N/A;   CARDIAC CATHETERIZATION Left 06/04/2015   Procedure: Left Heart Cath and Coronary Angiography;  Surgeon: MWellington Hampshire MD;  [facility];  Service: Cardiovascular;  Laterality: Left;   CARDIAC CATHETERIZATION N/A 06/04/2015   Procedure: Coronary Stent Intervention;  Surgeon: MWellington Hampshire MD;  [place];  Service: Cardiovascular;  Laterality: N/A;   CESAREAN SECTION  2001   CHOLECYSTECTOMY N/A 11/18/2016   Procedure: LAPAROSCOPIC CHOLECYSTECTOMY WITH INTRAOPERATIVE CHOLANGIOGRAM;  Surgeon: SChristene Lye MD;  Location: [facility];  Service: General;  Laterality: N/A;   COLONOSCOPY WITH PROPOFOL N/A 04/27/2016   Procedure: COLONOSCOPY WITH PROPOFOL;  Surgeon: DLucilla Lame MD;  [place]  Service: Endoscopy;  Laterality: N/A;   COLONOSCOPY WITH PROPOFOL N/A 01/12/2018   Procedure: COLONOSCOPY WITH PROPOFOL;  Surgeon: Toledo, TBenay Pike MD;  Location: [facility];  Service: Endoscopy;  Laterality: N/A;   CORONARY ANGIOPLASTY     CORONARY BALLOON ANGIOPLASTY N/A 06/29/2017   Procedure: CORONARY BALLOON ANGIOPLASTY;  Surgeon: AWellington Hampshire MD;  [facility];  Service: Cardiovascular;  Laterality: N/A;   CORONARY BALLOON ANGIOPLASTY N/A 09/20/2017   Procedure: CORONARY BALLOON ANGIOPLASTY;  Surgeon: AWellington Hampshire MD;  [place] [facility];  Service: Cardiovascular;  Laterality: N/A;   DILATION AND CURETTAGE OF UTERUS     ESOPHAGOGASTRODUODENOSCOPY (EGD) WITH PROPOFOL N/A 09/14/2014   Procedure: ESOPHAGOGASTRODUODENOSCOPY (EGD) WITH PROPOFOL;  Surgeon: MJosefine Class MD;  [facility];  Service: Endoscopy;  Laterality: N/A;   ESOPHAGOGASTRODUODENOSCOPY (EGD) WITH PROPOFOL N/A 04/27/2016   Procedure: ESOPHAGOGASTRODUODENOSCOPY (EGD) WITH PROPOFOL;  Surgeon: DLucilla Lame MD;  [place]  Service: Endoscopy;  Laterality: N/A;  Diabetic - oral meds   ESOPHAGOGASTRODUODENOSCOPY (EGD) WITH PROPOFOL  N/A 01/12/2018   Procedure: ESOPHAGOGASTRODUODENOSCOPY (EGD) WITH PROPOFOL;  Surgeon: Toledo, Benay Pike, MD;  Location: [facility];  Service: Endoscopy;  Laterality: N/A;   ESOPHAGOGASTRODUODENOSCOPY (EGD) WITH PROPOFOL N/A 04/11/2019   Procedure: ESOPHAGOGASTRODUODENOSCOPY (EGD) WITH PROPOFOL;  Surgeon: [name], MD;  Location: [facility];  Service: Endoscopy;  Laterality: N/A;   GASTRIC BYPASS  09/2009   [facility]    INTRAMEDULLARY (IM) NAIL INTERTROCHANTERIC Left 10/13/2018   Procedure: INTRAMEDULLARY (IM) NAIL INTERTROCHANTRIC;  Surgeon: Leandrew Koyanagi, MD;  Location: [place];  Service: Orthopedics;  Laterality: Left;   Left Carotid to sublcavian artery  bypass w/ subclavian artery ligation     a. Performed @ [facility].   LEFT HEART CATH AND CORONARY ANGIOGRAPHY Left 06/29/2017   Procedure: LEFT HEART CATH AND CORONARY ANGIOGRAPHY;  Surgeon: Wellington Hampshire, MD;  Location: [place] [facility];  Service: Cardiovascular;  Laterality: Left;   LEFT HEART CATH AND CORONARY ANGIOGRAPHY N/A 09/20/2017   Procedure: LEFT HEART CATH AND CORONARY ANGIOGRAPHY;  Surgeon: Wellington Hampshire, MD;  Location: [place] [facility];  Service: Cardiovascular;  Laterality: N/A;   LEFT HEART CATH AND CORONARY ANGIOGRAPHY N/A 12/20/2017   Procedure: LEFT HEART CATH AND CORONARY ANGIOGRAPHY;  Surgeon: Wellington Hampshire, MD;  Location: [place] [facility];  Service: Cardiovascular;  Laterality: N/A;   LEFT HEART CATH AND CORONARY ANGIOGRAPHY N/A 04/20/2019   Procedure: LEFT HEART CATH AND CORONARY ANGIOGRAPHY possible PCI;  Surgeon: Minna Merritts, MD;  Location: [place] [facility];  Service: Cardiovascular;  Laterality: N/A;   MELANOMA EXCISION  2016   Dr. Henreitta Cea ABLATION  2002   RIGHT OOPHORECTOMY     SHOULDER ARTHROSCOPY WITH OPEN ROTATOR CUFF REPAIR Right 01/07/2016   Procedure: SHOULDER ARTHROSCOPY WITH DEBRIDMENT, SUBACHROMIAL DECOMPRESSION;  Surgeon: Corky Mull, MD;  Location: [facility];  Service: Orthopedics;  Laterality: Right;   SHOULDER ARTHROSCOPY WITH OPEN ROTATOR CUFF REPAIR Right 03/16/2017   Procedure: SHOULDER ARTHROSCOPY WITH OPEN ROTATOR CUFF REPAIR POSSIBLE BICEPS TENODESIS;  Surgeon: Corky Mull, MD;  Location: [facility];  Service: Orthopedics;  Laterality: Right;   TRIGGER FINGER RELEASE Right     [place]     Medications Prior to Admission: Prior to Admission medications   Medication Sig Start Date End Date Taking? Authorizing Provider  ALPRAZolam Duanne Moron) 1 MG tablet Take 1 tablet by mouth twice daily as needed Patient taking differently: Take 1 mg by mouth 2 (two) times daily as needed for anxiety.  08/14/19  Yes [name], MD  amLODipine (NORVASC) 2.5 MG tablet Take 1 tablet (2.5 mg total) by mouth daily. Patient taking differently: Take 2.5 mg by mouth every evening.  10/25/19  Yes Gollan, Kathlene November, MD  aspirin EC 81 MG tablet Take 1 tablet (81 mg total) by mouth daily. Swallow whole. 12/07/19  Yes Wellington Hampshire, MD  Biotin w/ Vitamins C & E (HAIR SKIN & NAILS GUMMIES PO) Take 4 tablets by mouth daily.   Yes [provider]  buPROPion (WELLBUTRIN SR) 200 MG 12 hr tablet Take 200 mg by mouth every morning. 11/17/19  Yes [provider]  carvedilol (COREG) 12.5 MG tablet Take 1 tablet (12.5 mg total) by mouth 2 (two) times daily with a meal. 10/20/19  Yes Dwyane Dee, MD  clopidogrel (PLAVIX) 75 MG tablet Take 1 tablet by mouth once daily Patient taking differently: Take 75 mg by mouth daily.  11/24/19  Yes  Loel Dubonnet, NP  EUTHYROX 25 MCG tablet TAKE 1 TABLET BY MOUTH ONCE DAILY BEFORE BREAKFAST Patient taking differently: Take 25 mcg by mouth daily before breakfast.  12/02/19  Yes [name], MD  FIBER COMPLETE PO Take 3 capsules by mouth daily.    Yes [provider]  gabapentin (NEURONTIN) 300 MG capsule Take 1 capsule (300 mg total) by mouth 2 (two) times daily. 10/12/18  Yes [name], MD  isosorbide mononitrate (IMDUR) 120 MG 24 hr tablet Take 1 tablet (120 mg total) by mouth daily. 10/21/19  Yes Dwyane Dee, MD  Multiple Vitamin (MULTIVITAMIN) capsule Take 1 capsule by mouth daily. gummies   Yes [provider]  nitroGLYCERIN (NITROSTAT) 0.4 MG SL tablet Place 1 tablet (0.4 mg total) under the tongue every 5 (five) minutes as needed for chest pain. 10/20/19  Yes Dwyane Dee, MD  pantoprazole (PROTONIX) 40 MG tablet Take 1 tablet by mouth twice daily Patient taking differently: Take 40 mg by mouth 2 (two) times daily.  06/06/19  Yes [name], MD  Polyethyl Glycol-Propyl Glycol (SYSTANE OP) Place 1 drop into both eyes daily as needed  (dry eyes).   Yes [provider]  promethazine (PHENERGAN) 25 MG tablet Take 25 mg by mouth every 6 (six) hours as needed for nausea or vomiting.  07/25/19  Yes [provider]  rosuvastatin (CRESTOR) 40 MG tablet Take 1 tablet (40 mg total) by mouth daily at 6 PM. 10/25/19  Yes Gollan, Kathlene November, MD  traZODone (DESYREL) 100 MG tablet TAKE 1 TO 2 TABLETS BY MOUTH AT BEDTIME Patient taking differently: Take 100-200 mg by mouth at bedtime as needed for sleep.  11/15/19  Yes [name], MD  UBRELVY 100 MG TABS Take 100 mg by mouth 2 (two) times daily as needed (migraine).  09/13/19  Yes [provider]  apixaban (ELIQUIS) 2.5 MG TABS tablet Take 2.5 mg by mouth 2 (two) times daily. On Hold per MD LD on 12-08-19    [provider]  desvenlafaxine (PRISTIQ) 50 MG 24 hr tablet Take 50 mg by mouth daily. 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 Disabled through 08/02/2013 per forms 02/03/2013  Tobacco Use   Smoking status: Former Smoker    Types: Cigarettes    Quit date: 08/31/1994    Years since quitting: 25.2   Smokeless tobacco: Never Used   Tobacco comment: quit 28 years ago  Vaping Use   Vaping Use: Never used  Substance and Sexual Activity   Alcohol use: No    Alcohol/week: 0.0 standard drinks   Drug use: No   Sexual activity: Not Currently    Birth control/protection: None, Surgical    Comment:  Ablation  Other Topics Concern   Not on file  Social History Narrative   Previously did  custolial work. 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   Lives at home with son   Social Determinants of Health   Financial Resource Strain: Low Risk    Difficulty of Paying Living Expenses: Not very hard  Food Insecurity: No Food Insecurity   Worried About Charity fundraiser in the Last Year: Never true   Arboriculturist in the Last Year: Never true  Transportation Needs: No Transportation Needs   Lack of Transportation (Medical): No   Lack of Transportation (Non-Medical): No  Physical Activity: Inactive   Days of Exercise per Week: 0 days   Minutes of Exercise per Session: 0 min  Stress: Stress Concern Present   Feeling of Stress : Very much  Social Connections: Moderately Isolated   Frequency of Communication with Friends and Family: More than three times a week   Frequency of Social Gatherings with Friends and Family: More than three times a week   Attends Religious Services: More than 4 times per year   Active Member of [employer] or Organizations: No   Attends Music therapist: Never   Marital Status: Divorced  Human resources officer Violence: Not At Risk   Fear of Current or Ex-Partner: No   Emotionally Abused: No   Physically Abused: No   Sexually Abused: No    Family History:   The patient's family history includes Anxiety disorder in her father and mother; Bipolar disorder in her mother; Breast cancer in an other family member; Breast cancer (age of onset: 33) in her maternal aunt and maternal aunt; Colon cancer in her cousin and father; Depression in her father, mother, and sister; Diabetes in her father and sister; Heart disease in her mother; Heart disease (age of onset: 18) in her father; Hyperlipidemia in her mother and sister; Hypertension in her father, mother, sister, and sister; Kidney cancer in her cousin; Kidney disease in her father and  sister; Ovarian cancer in her cousin; Skin cancer in her father; Stroke in her father; Thyroid nodules in her sister. There is no history of Bladder Cancer.    ROS:  Please see the history of present illness.  All other ROS reviewed and negative.     Physical Exam/Data:   Vitals:   12/12/19 1445 12/12/19 1451 12/12/19 1500 12/12/19 1530  BP: (!) 163/96  (!) 152/106 (!) 160/103  Pulse: 75 87 79 76  Resp: '12 16 17 16  ' Temp:  97.9 F (36.6 C)    TempSrc:  Oral    SpO2: 100% 100% 100% 100%  Weight:      Height:       No intake or output data in the 24 hours ending 12/12/19 1627 Last 3 Weights 12/12/2019 12/07/2019 11/28/2019  Weight (lbs) 153 lb 153 lb 153 lb  Weight (kg) 69.4 kg 69.4 kg 69.4 kg  Some encounter information is confidential and restricted. Go to Review Flowsheets activity to see all data.     Body mass index is 27.98 kg/m.  General:  No acute distress, tearful when talking about her father HEENT: Mucous membranes moist. PEERL Neck: no JVD Endocrine:  No thryomegaly Vascular: No carotid bruits; FA pulses 2+ bilaterally without bruits  Cardiac:  normal S1, S2; RRR; no murmur  Lungs:  clear to auscultation bilaterally, no wheezing, rhonchi or rales  Abd: soft, nontender, no hepatomegaly  Ext: no edema Musculoskeletal:  No deformities Skin: warm and dry  Neuro:  no focal abnormalities noted Psych:  Normal affect   EKG:  The ECG that was done was personally reviewed and demonstrates rate of 75 normal sinus rhythm, normal axis. Normal PR,QTC intervals. Compared to prior no changes 12/07/19.   Relevant CV Studies:  Echocardiogram 11/06/19   Left Ventricle: Left ventricular ejection fraction, by estimation, is 60  to 65%. The left ventricle has normal function. The left ventricle has no  regional wall motion abnormalities. The left ventricular internal cavity  size was normal in size. There is   mild left ventricular hypertrophy. Left ventricular diastolic  parameters  are consistent with Grade I diastolic dysfunction (impaired relaxation).   Right Ventricle: The right ventricular size is mildly enlarged. Right  vetricular wall thickness was not well visualized. Right ventricular  systolic function was not well visualized.   Left Atrium: Left atrial size was mildly dilated.   Right Atrium: Right atrial size was mildly dilated.   Pericardium: There is no evidence of pericardial effusion.   Mitral Valve: The mitral valve is grossly normal. Trivial mitral valve  regurgitation.   Tricuspid Valve: The tricuspid valve is not well visualized. Tricuspid  valve regurgitation is trivial.   Aortic Valve: The aortic valve was not well visualized. Aortic valve  regurgitation is not visualized. Aortic valve mean gradient measures 5.0  mmHg. Aortic valve peak gradient measures 8.1 mmHg. Aortic valve area, by  VTI measures 1.35 cm.   Pulmonic Valve: The pulmonic valve was not well visualized. Pulmonic valve  regurgitation is trivial.   Aorta: The aortic root is normal in size and structure.   IAS/Shunts: The interatrial septum was not assessed.   Laboratory Data:  High Sensitivity Troponin:   Recent Labs  Lab 11/18/19 1234 11/18/19 1529 12/12/19 1154 12/12/19 1346  TROPONINIHS '8 6 9 14      ' Chemistry Recent Labs  Lab 12/07/19 1048 12/12/19 1154  NA 141 140  K 3.8 3.7  CL 107* 107  CO2 23 23  GLUCOSE 100* 123*  BUN 14 19  CREATININE 1.84* 2.02*  CALCIUM 8.8 9.2  GFRNONAA 31* 29*  GFRAA 36*  --   ANIONGAP  --  10    No results for input(s): PROT, ALBUMIN, AST, ALT, ALKPHOS, BILITOT in the last 168 hours. Hematology Recent Labs  Lab 12/07/19 1048 12/12/19 1154  WBC 6.6 6.0  RBC 4.31 4.56  HGB 11.6 12.0  HCT 35.5 38.7  MCV 82 84.9  MCH 26.9 26.3  MCHC 32.7 31.0  RDW 13.0 13.2  PLT 188 183   BNPNo results for input(s): BNP, PROBNP in the last 168 hours.  DDimer No results for input(s): DDIMER in the last 168  hours.   Radiology/Studies:  DG Chest 2 View  Result Date: 12/12/2019 CLINICAL DATA:  Chest pain. EXAM: CHEST - 2 VIEW COMPARISON:  11/18/2019. FINDINGS: Similar cardiac silhouette. Right-sided aortic arch with vascular ring, better characterized on prior CT chest. Surgical clips project in the region of the left thoracic inlet. Both lungs are clear. No visible pleural effusions or pneumothorax. No acute osseous abnormality. Cholecystectomy clips. IMPRESSION: No active cardiopulmonary disease. Electronically Signed   By: Margaretha Sheffield MD   On: 12/12/2019 12:24     Assessment and Plan:   Chronic Angina Patient with worsening of her angina symptoms, now occurring at rest with associated dyspnea. Possible unstable angina in context of history of CAD with s/p PCI LAD with DES and multiple balloon angioplasty's in the past that have failed per the patient. Patient with history of  antianginal therapy, however, was not able to increase doses in setting of hypotension and side effects. Because of this and the patient's inability to perform ADL's without pain/shortnress of breath, she requested revascularization occur. Dr. Fletcher Anon had scheduled for the patient to have procedure tomorrow, however, she presented to the ED due to worsening of the pain. Troponin I negative at this time. No acute EKG changes.  -plan for catheterization  tomorrow -Hydrate prior to procedure in context of CKD -NPO at midnight for procedure tomorrow -continue home rosuvastatin, imdur -continue aspirin/plavix in context of catheterization tomorrow -Hgb A1c of 5.2 month ago -CBC CMP tomorrow morning -discharge home on DAPT and follow up with her primary cardiologist  HFpEF Secondary to Ischemic Cardiomyopathy Echo 10/04 EF 16-10%, grade I diastolic dysfunction. Patient euvolemic upon examination.  -Continue Coreg, Imdur.   CKD Stg 3b/4 Cr 2.02, worse from prior of 1.84 5 days ago. GFR of 29, patient borderline CKD stg 4.  Appears to be stable at this time. -hydrate with IV fluids prior to procedure -avoid nephrotoxic medications  History of Suspected Pulmonary Embolism Patient with recent VQ scan due to chest pain/shortnress of breath, diagnosed with suspected pulmonary embolism and started on eliqus. Recent CT chest on 11/18/19 did not reveal pulmonary embolism. Patient's cardiologist Dr. Fletcher Anon discontinued eliqus due to upcoming catheterization  Hx of HTN Patient with history of HTN, BP of 160/103. Patient with history of persistent HTN despite multiple medication trials.  -continue home medications of amlodipine, carvedilol.   Hx of Hypothyroidism Restart home euthyrox at discharge  Severity of Illness: The appropriate patient status for this patient is OBSERVATION. Observation status is judged to be reasonable and necessary in order to provide the required intensity of service to ensure the patient's safety. The patient's presenting symptoms, physical exam findings, and initial radiographic and laboratory data in the context of their medical condition is felt to place them at decreased risk for further clinical deterioration. Furthermore, it is anticipated that the patient will be medically stable for discharge from the hospital within 2 midnights of admission. The following factors support the patient status of observation.    The patient's presenting symptoms include chest pain,dyspnea  The physical exam findings are unremarkable.   The initial radiographic and laboratory data are positive for CKD3b/4, HFpEF per recent echo     For questions or updates, please contact [place] Please consult [email or web address] for contact info under    Signed, Sanjuana Letters, MD  12/12/2019 4:27 PM

## 2019-12-12 NOTE — Telephone Encounter (Signed)
Pt contacted pre-catheterization scheduled at [facility] for: Wednesday December 13, 2019 12 Noon Verified arrival time and place: [facility]) at: 7:30-8 AM-pre-procedure hydration   No solid food after midnight prior to cath, clear liquids until 5 AM day of procedure.  Hold: Aleve-until post procedure-GFR 31  AM meds can be  taken pre-cath with sips of water including: ASA 81 mg Plavix 75 mg   Confirmed patient has responsible adult to drive home post procedure and be with patient first 24 hours after arriving home: yes  You are allowed ONE visitor in the waiting room during the time you are at the hospital for your procedure. Both you and your visitor must wear a mask once you enter the hospital.       COVID-19 Pre-Screening Questions:   In the past 14 days have you had a new cough, new headache, new nasal congestion, fever (100.4 or greater) unexplained body aches, new sore throat, or sudden loss of taste or sense of smell? no  In the past 14 days have you been around anyone with known Covid 19? No  Reviewed procedure/mask/visitor instructions, COVID-19 questions with patient.  Since office visit with Dr Fletcher Anon 12/07/19, patient reports more frequent episodes of chest pain, relieved with NTG. Pt reports last episode of chest pain  last night, currently symptom free. Pt advised to continue to use NTG for chest pain symptoms and if chest pain not relieved after NTG X 3, call 911.

## 2019-12-13 ENCOUNTER — Inpatient Hospital Stay (HOSPITAL_COMMUNITY)
Admission: EM | Disposition: A | Discharge status: Home / Self Care | Payer: Self-pay | Source: Home / Self Care | Attending: Cardiology

## 2019-12-13 ENCOUNTER — Ambulatory Visit (HOSPITAL_COMMUNITY): Admission: RE | Admit: 2019-12-13 | Payer: Medicare Other | Source: Home / Self Care | Admitting: Cardiovascular Disease

## 2019-12-13 DIAGNOSIS — I200 Unstable angina: Secondary | ICD-10-CM | POA: Diagnosis not present

## 2019-12-13 DIAGNOSIS — Z7902 Long term (current) use of antithrombotics/antiplatelets: Secondary | ICD-10-CM | POA: Diagnosis not present

## 2019-12-13 DIAGNOSIS — Z8711 Personal history of peptic ulcer disease: Secondary | ICD-10-CM | POA: Diagnosis not present

## 2019-12-13 DIAGNOSIS — Z955 Presence of coronary angioplasty implant and graft: Secondary | ICD-10-CM | POA: Diagnosis not present

## 2019-12-13 DIAGNOSIS — T82855A Stenosis of coronary artery stent, initial encounter: Secondary | ICD-10-CM | POA: Diagnosis not present

## 2019-12-13 DIAGNOSIS — I255 Ischemic cardiomyopathy: Secondary | ICD-10-CM | POA: Diagnosis not present

## 2019-12-13 DIAGNOSIS — N184 Chronic kidney disease, stage 4 (severe): Secondary | ICD-10-CM | POA: Diagnosis not present

## 2019-12-13 DIAGNOSIS — Z885 Allergy status to narcotic agent status: Secondary | ICD-10-CM | POA: Diagnosis not present

## 2019-12-13 DIAGNOSIS — E1122 Type 2 diabetes mellitus with diabetic chronic kidney disease: Secondary | ICD-10-CM | POA: Diagnosis present

## 2019-12-13 DIAGNOSIS — N1832 Chronic kidney disease, stage 3b: Secondary | ICD-10-CM | POA: Diagnosis not present

## 2019-12-13 DIAGNOSIS — Z85820 Personal history of malignant melanoma of skin: Secondary | ICD-10-CM | POA: Diagnosis not present

## 2019-12-13 DIAGNOSIS — I25119 Atherosclerotic heart disease of native coronary artery with unspecified angina pectoris: Secondary | ICD-10-CM

## 2019-12-13 DIAGNOSIS — Z803 Family history of malignant neoplasm of breast: Secondary | ICD-10-CM | POA: Diagnosis not present

## 2019-12-13 DIAGNOSIS — E785 Hyperlipidemia, unspecified: Secondary | ICD-10-CM | POA: Diagnosis not present

## 2019-12-13 DIAGNOSIS — I252 Old myocardial infarction: Secondary | ICD-10-CM | POA: Diagnosis not present

## 2019-12-13 DIAGNOSIS — Z7901 Long term (current) use of anticoagulants: Secondary | ICD-10-CM | POA: Diagnosis not present

## 2019-12-13 DIAGNOSIS — R072 Precordial pain: Secondary | ICD-10-CM

## 2019-12-13 DIAGNOSIS — I25110 Atherosclerotic heart disease of native coronary artery with unstable angina pectoris: Secondary | ICD-10-CM | POA: Diagnosis not present

## 2019-12-13 DIAGNOSIS — Z90721 Acquired absence of ovaries, unilateral: Secondary | ICD-10-CM | POA: Diagnosis not present

## 2019-12-13 DIAGNOSIS — Z9049 Acquired absence of other specified parts of digestive tract: Secondary | ICD-10-CM | POA: Diagnosis not present

## 2019-12-13 DIAGNOSIS — E039 Hypothyroidism, unspecified: Secondary | ICD-10-CM | POA: Diagnosis not present

## 2019-12-13 DIAGNOSIS — F319 Bipolar disorder, unspecified: Secondary | ICD-10-CM | POA: Diagnosis present

## 2019-12-13 DIAGNOSIS — I130 Hypertensive heart and chronic kidney disease with heart failure and stage 1 through stage 4 chronic kidney disease, or unspecified chronic kidney disease: Secondary | ICD-10-CM | POA: Diagnosis not present

## 2019-12-13 DIAGNOSIS — I5032 Chronic diastolic (congestive) heart failure: Secondary | ICD-10-CM | POA: Diagnosis not present

## 2019-12-13 DIAGNOSIS — Y712 Prosthetic and other implants, materials and accessory cardiovascular devices associated with adverse incidents: Secondary | ICD-10-CM | POA: Diagnosis present

## 2019-12-13 DIAGNOSIS — Z8673 Personal history of transient ischemic attack (TIA), and cerebral infarction without residual deficits: Secondary | ICD-10-CM | POA: Diagnosis not present

## 2019-12-13 DIAGNOSIS — I25118 Atherosclerotic heart disease of native coronary artery with other forms of angina pectoris: Secondary | ICD-10-CM | POA: Diagnosis not present

## 2019-12-13 DIAGNOSIS — Z888 Allergy status to other drugs, medicaments and biological substances status: Secondary | ICD-10-CM | POA: Diagnosis not present

## 2019-12-13 DIAGNOSIS — I10 Essential (primary) hypertension: Secondary | ICD-10-CM | POA: Diagnosis not present

## 2019-12-13 DIAGNOSIS — Z79899 Other long term (current) drug therapy: Secondary | ICD-10-CM | POA: Diagnosis not present

## 2019-12-13 DIAGNOSIS — Z9884 Bariatric surgery status: Secondary | ICD-10-CM | POA: Diagnosis not present

## 2019-12-13 HISTORY — PX: CORONARY STENT INTERVENTION: CATH118234

## 2019-12-13 HISTORY — PX: LEFT HEART CATH AND CORONARY ANGIOGRAPHY: CATH118249

## 2019-12-13 LAB — GLUCOSE, CAPILLARY
Glucose-Capillary: 58 mg/dL — ABNORMAL LOW (ref 70–99)
Glucose-Capillary: 152 mg/dL — ABNORMAL HIGH (ref 70–99)

## 2019-12-13 LAB — BASIC METABOLIC PANEL
Anion gap: 8 (ref 5–15)
BUN: 17 mg/dL (ref 6–20)
CO2: 24 mmol/L (ref 22–32)
Calcium: 8.9 mg/dL (ref 8.9–10.3)
Chloride: 109 mmol/L (ref 98–111)
Creatinine, Ser: 1.81 mg/dL — ABNORMAL HIGH (ref 0.44–1.00)
GFR, Estimated: 33 mL/min — ABNORMAL LOW (ref >60)
Glucose, Bld: 98 mg/dL (ref 70–99)
Potassium: 3.8 mmol/L (ref 3.5–5.1)
Sodium: 141 mmol/L (ref 135–145)

## 2019-12-13 LAB — CBC
HCT: 32.4 % — ABNORMAL LOW (ref 36.0–46.0)
Hemoglobin: 10.3 g/dL — ABNORMAL LOW (ref 12.0–15.0)
MCH: 26.4 pg (ref 26.0–34.0)
MCHC: 31.8 g/dL (ref 30.0–36.0)
MCV: 83.1 fL (ref 80.0–100.0)
Platelets: 147 10*3/uL — ABNORMAL LOW (ref 150–400)
RBC: 3.9 MIL/uL (ref 3.87–5.11)
RDW: 13.2 % (ref 11.5–15.5)
WBC: 6 10*3/uL (ref 4.0–10.5)
nRBC: 0 % (ref 0.0–0.2)

## 2019-12-13 LAB — POCT ACTIVATED CLOTTING TIME: Activated Clotting Time: 505 seconds

## 2019-12-13 LAB — PREGNANCY, URINE: Preg Test, Ur: NEGATIVE

## 2019-12-13 SURGERY — LEFT HEART CATH AND CORONARY ANGIOGRAPHY
Anesthesia: LOCAL

## 2019-12-13 MED ORDER — SODIUM CHLORIDE 0.9 % IV SOLN
INTRAVENOUS | Status: DC | PRN
  Administered 2019-12-13: 1.75 mg/kg/h via INTRAVENOUS

## 2019-12-13 MED ORDER — ONDANSETRON HCL 4 MG/2ML IJ SOLN: 4.0000 mg | Freq: Four times a day (QID) | INTRAMUSCULAR | Status: DC | PRN

## 2019-12-13 MED ORDER — NITROGLYCERIN 1 MG/10 ML FOR IR/CATH LAB
INTRA_ARTERIAL | Status: AC
  Filled 2019-12-13: qty 10

## 2019-12-13 MED ORDER — SODIUM CHLORIDE 0.9% FLUSH
3.0000 mL | Freq: Two times a day (BID) | INTRAVENOUS | Status: DC
  Administered 2019-12-13: 3 mL via INTRAVENOUS

## 2019-12-13 MED ORDER — FENTANYL CITRATE (PF) 100 MCG/2ML IJ SOLN
INTRAMUSCULAR | Status: DC | PRN
  Administered 2019-12-13: 25 ug via INTRAVENOUS
  Administered 2019-12-13 (×2): 50 ug via INTRAVENOUS
  Administered 2019-12-13: 25 ug via INTRAVENOUS

## 2019-12-13 MED ORDER — SODIUM CHLORIDE 0.9% FLUSH: 3.0000 mL | INTRAVENOUS | Status: DC | PRN

## 2019-12-13 MED ORDER — HEPARIN (PORCINE) IN NACL 1000-0.9 UT/500ML-% IV SOLN
INTRAVENOUS | Status: DC | PRN
  Administered 2019-12-13 (×2): 500 mL

## 2019-12-13 MED ORDER — LIDOCAINE HCL (PF) 1 % IJ SOLN
INTRAMUSCULAR | Status: DC | PRN
  Administered 2019-12-13: 10 mL
  Administered 2019-12-13: 15 mL via INTRADERMAL

## 2019-12-13 MED ORDER — IOHEXOL 350 MG/ML SOLN
INTRAVENOUS | Status: DC | PRN
  Administered 2019-12-13: 75 mL via INTRA_ARTERIAL

## 2019-12-13 MED ORDER — SODIUM CHLORIDE 0.9 % IV SOLN: INTRAVENOUS | Status: AC

## 2019-12-13 MED ORDER — SODIUM CHLORIDE 0.9 % IV SOLN: 250.0000 mL | INTRAVENOUS | Status: DC | PRN

## 2019-12-13 MED ORDER — BIVALIRUDIN BOLUS VIA INFUSION - CUPID
INTRAVENOUS | Status: DC | PRN
  Administered 2019-12-13: 51.375 mg via INTRAVENOUS

## 2019-12-13 MED ORDER — MIDAZOLAM HCL 2 MG/2ML IJ SOLN
INTRAMUSCULAR | Status: DC | PRN
  Administered 2019-12-13 (×2): 1 mg via INTRAVENOUS

## 2019-12-13 MED ORDER — ACETAMINOPHEN 325 MG PO TABS
650.0000 mg | ORAL_TABLET | ORAL | Status: DC | PRN
  Filled 2019-12-13: qty 2

## 2019-12-13 MED ORDER — LIDOCAINE-EPINEPHRINE 1 %-1:100000 IJ SOLN
INTRAMUSCULAR | Status: DC | PRN
  Administered 2019-12-13: 4 mL

## 2019-12-13 SURGICAL SUPPLY — 20 items
BALLN EMERGE MR 2.0X12 (BALLOONS) ×2
BALLN SAPPHIRE ~~LOC~~ 3.0X15 (BALLOONS) ×1 IMPLANT
BALLOON EMERGE MR 2.0X12 (BALLOONS) IMPLANT
CATH INFINITI 5FR MULTPACK ANG (CATHETERS) ×1 IMPLANT
CATH VISTA GUIDE 6FR XBLAD4 (CATHETERS) IMPLANT
CATH VISTA GUIDE 7FR XBLAD4 (CATHETERS) ×1 IMPLANT
CLOSURE PERCLOSE PROSTYLE (VASCULAR PRODUCTS) ×1 IMPLANT
KIT ENCORE 26 ADVANTAGE (KITS) ×1 IMPLANT
KIT HEART LEFT (KITS) ×2 IMPLANT
KIT MICROPUNCTURE NIT STIFF (SHEATH) ×1 IMPLANT
PACK CARDIAC CATHETERIZATION (CUSTOM PROCEDURE TRAY) ×2 IMPLANT
SHEATH PINNACLE 5F 10CM (SHEATH) ×1 IMPLANT
SHEATH PINNACLE 7F 10CM (SHEATH) ×1 IMPLANT
SHEATH PROBE COVER 6X72 (BAG) ×1 IMPLANT
STENT RESOLUTE ONYX 2.75X22 (Permanent Stent) ×1 IMPLANT
TRANSDUCER W/STOPCOCK (MISCELLANEOUS) ×2 IMPLANT
TUBING CIL FLEX 10 FLL-RA (TUBING) ×2 IMPLANT
WIRE ASAHI PROWATER 180CM (WIRE) ×1 IMPLANT
WIRE EMERALD 3MM-J .035X150CM (WIRE) ×1 IMPLANT
WIRE RUNTHROUGH .014X180CM (WIRE) ×1 IMPLANT

## 2019-12-13 NOTE — Progress Notes (Signed)
Per patient last dose of eliquis was taken 12/07/2019.

## 2019-12-13 NOTE — H&P (View-Only) (Signed)
Progress Note  Patient Name: [name] Date of Encounter: 12/13/2019  [facility] Cardiologist: Ida Rogue, MD   Subjective   Continued to have intermittent arm and leg pain overnight, no specific chest pain. We reviewed plan for cath again. We specifically discussed risk of contrast nephropathy, she understands and wishes to proceed.  Inpatient Medications    Scheduled Meds: . amLODipine  2.5 mg Oral QPM  . [START ON 12/14/2019] aspirin EC  81 mg Oral Daily  . buPROPion  200 mg Oral q morning - 10a  . carvedilol  12.5 mg Oral BID WC  . Chlorhexidine Gluconate Cloth  6 each Topical Q0600  . clopidogrel  75 mg Oral Daily  . gabapentin  300 mg Oral BID  . isosorbide mononitrate  120 mg Oral Daily  . mupirocin ointment  1 application Nasal BID  . pantoprazole  40 mg Oral BID  . rosuvastatin  40 mg Oral q1800  . sodium chloride flush  3 mL Intravenous Q12H  . sodium chloride flush  3 mL Intravenous Q12H   Continuous Infusions: . sodium chloride    . sodium chloride 100 mL/hr at 12/13/19 0611   PRN Meds: sodium chloride, nitroGLYCERIN, sodium chloride flush, traZODone   Vital Signs    Vitals:   12/13/19 0600 12/13/19 0700 12/13/19 0809 12/13/19 1132  BP: 121/71 127/76 126/63 125/66  Pulse:  72 66 60  Resp: 19 12  15   Temp:   97.9 F (36.6 C) 98 F (36.7 C)  TempSrc:   Oral Oral  SpO2:      Weight:      Height:        Intake/Output Summary (Last 24 hours) at 12/13/2019 1214 Last data filed at 12/13/2019 0400 Gross per 24 hour  Intake 716.65 ml  Output --  Net 716.65 ml   Last 3 Weights 12/12/2019 12/12/2019 12/07/2019  Weight (lbs) 151 lb 0.2 oz 153 lb 153 lb  Weight (kg) 68.5 kg 69.4 kg 69.4 kg  Some encounter information is confidential and restricted. Go to Review Flowsheets activity to see all data.      Telemetry    NSR - Personally Reviewed  ECG    Sinus tachycardia at 101 bpm - Personally Reviewed  Physical Exam   GEN: No  acute distress.   Neck: No JVD Cardiac: RRR, no murmurs, rubs, or gallops.  Respiratory: Clear to auscultation bilaterally. GI: Soft, nontender, non-distended  MS: No edema; No deformity. Neuro:  Nonfocal  Psych: Normal affect   Labs    High Sensitivity Troponin:   Recent Labs  Lab 11/18/19 1234 11/18/19 1529 12/12/19 1154 12/12/19 1346  TROPONINIHS 8 6 9 14       Chemistry Recent Labs  Lab 12/07/19 1048 12/12/19 1154 12/13/19 0036  NA 141 140 141  K 3.8 3.7 3.8  CL 107* 107 109  CO2 23 23 24   GLUCOSE 100* 123* 98  BUN 14 19 17   CREATININE 1.84* 2.02* 1.81*  CALCIUM 8.8 9.2 8.9  GFRNONAA 31* 29* 33*  GFRAA 36*  --   --   ANIONGAP  --  10 8     Hematology Recent Labs  Lab 12/07/19 1048 12/12/19 1154 12/13/19 0036  WBC 6.6 6.0 6.0  RBC 4.31 4.56 3.90  HGB 11.6 12.0 10.3*  HCT 35.5 38.7 32.4*  MCV 82 84.9 83.1  MCH 26.9 26.3 26.4  MCHC 32.7 31.0 31.8  RDW 13.0 13.2 13.2  PLT 188 183 147*  BNPNo results for input(s): BNP, PROBNP in the last 168 hours.   DDimer No results for input(s): DDIMER in the last 168 hours.   Radiology    DG Chest 2 View  Result Date: 12/12/2019 CLINICAL DATA:  Chest pain. EXAM: CHEST - 2 VIEW COMPARISON:  11/18/2019. FINDINGS: Similar cardiac silhouette. Right-sided aortic arch with vascular ring, better characterized on prior CT chest. Surgical clips project in the region of the left thoracic inlet. Both lungs are clear. No visible pleural effusions or pneumothorax. No acute osseous abnormality. Cholecystectomy clips. IMPRESSION: No active cardiopulmonary disease. Electronically Signed   By: Margaretha Sheffield MD   On: 12/12/2019 12:24    Cardiac Studies   Pending cath today  Patient Profile     49 y.o. female with a history of coronary artery disease s/p PCI to LAD with drug-eluting stent placement, multiple balloon angioplasty on ostial first diagonal due to recurrent restenosis. Also with history of chronic diastolic  heart failure, stage 3/4 chronic kidney disease, prior CVA, left carotid to subclavian artery bypass, aortic arch aneurysm, anemia, obesity status post gastric bypass, pulmonary embolism, bipolar disorder, type 2 diabetes, essential hypertension, hyperlipidemia and frequent falls with syncope and presyncope. Presented to ER 12/12/19 with worsening chest pain, had already been planned for cath on 12/13/19  Assessment & Plan    CAD with prior interventions Chronic angina, with accelerating symptoms Hypertension Hyperlipidemia -hsTn unremarkable -we again discussed risks of cath, she wishes to proceed -hydrated overnight given her chronic kidney disease, Cr improved slightly this AM -continue rosuvastatin, imdur, carvedilol, amlodipine, aspirin, and clopidogrel -DOAC held for procedure -if no intervention performed, will determine if she could be discharged after cath  Chronic diastolic heart failure -tolerated gentle hydration overnight.  Chronic kidney disease, stage 3b today -tolerated pre-cath hydration -understands risk of contrast nephropathy and wishes to proceed  Concern for possible PE: suggested on VQ scan but no clot on CTA -had been on apixaban prior to cath, stopped in anticipation of procedure.  -unclear if this needs to be restarted post procedure, will defer to Dr. Fletcher Anon.  For questions or updates, please contact [place] Please consult [email or web address] for contact info under     Signed, Buford Dresser, MD  12/13/2019, 12:14 PM

## 2019-12-13 NOTE — Plan of Care (Signed)
  Problem: Education: Goal: Understanding of CV disease, CV risk reduction, and recovery process will improve Outcome: Progressing Goal: Individualized Educational Video(s) Outcome: Progressing   Problem: Activity: Goal: Ability to return to baseline activity level will improve Outcome: Progressing   Problem: Cardiovascular: Goal: Ability to achieve and maintain adequate cardiovascular perfusion will improve Outcome: Progressing Goal: Vascular access site(s) Level 0-1 will be maintained Outcome: Progressing   Problem: Health Behavior/Discharge Planning: Goal: Ability to safely manage health-related needs after discharge will improve Outcome: Progressing   

## 2019-12-13 NOTE — Interval H&P Note (Signed)
Cath Lab Visit (complete for each Cath Lab visit)  Clinical Evaluation Leading to the Procedure:   ACS: No.  Non-ACS:    Anginal Classification: CCS III  Anti-ischemic medical therapy: Maximal Therapy (2 or more classes of medications)  Non-Invasive Test Results: No non-invasive testing performed  Prior CABG: No previous CABG      History and Physical Interval Note:  12/13/2019 2:40 PM  [name]  has presented today for surgery, with the diagnosis of angina.  The various methods of treatment have been discussed with the patient and family. After consideration of risks, benefits and other options for treatment, the patient has consented to  Procedure(s): LEFT HEART CATH AND CORONARY ANGIOGRAPHY (N/A) as a surgical intervention.  The patient's history has been reviewed, patient examined, no change in status, stable for surgery.  I have reviewed the patient's chart and labs.  Questions were answered to the patient's satisfaction.     Kathlyn Sacramento

## 2019-12-13 NOTE — Progress Notes (Signed)
Progress Note  Patient Name: [name] Date of Encounter: 12/13/2019  [facility] Cardiologist: Ida Rogue, MD   Subjective   Continued to have intermittent arm and leg pain overnight, no specific chest pain. We reviewed plan for cath again. We specifically discussed risk of contrast nephropathy, she understands and wishes to proceed.  Inpatient Medications    Scheduled Meds: . amLODipine  2.5 mg Oral QPM  . [START ON 12/14/2019] aspirin EC  81 mg Oral Daily  . buPROPion  200 mg Oral q morning - 10a  . carvedilol  12.5 mg Oral BID WC  . Chlorhexidine Gluconate Cloth  6 each Topical Q0600  . clopidogrel  75 mg Oral Daily  . gabapentin  300 mg Oral BID  . isosorbide mononitrate  120 mg Oral Daily  . mupirocin ointment  1 application Nasal BID  . pantoprazole  40 mg Oral BID  . rosuvastatin  40 mg Oral q1800  . sodium chloride flush  3 mL Intravenous Q12H  . sodium chloride flush  3 mL Intravenous Q12H   Continuous Infusions: . sodium chloride    . sodium chloride 100 mL/hr at 12/13/19 0611   PRN Meds: sodium chloride, nitroGLYCERIN, sodium chloride flush, traZODone   Vital Signs    Vitals:   12/13/19 0600 12/13/19 0700 12/13/19 0809 12/13/19 1132  BP: 121/71 127/76 126/63 125/66  Pulse:  72 66 60  Resp: 19 12  15   Temp:   97.9 F (36.6 C) 98 F (36.7 C)  TempSrc:   Oral Oral  SpO2:      Weight:      Height:        Intake/Output Summary (Last 24 hours) at 12/13/2019 1214 Last data filed at 12/13/2019 0400 Gross per 24 hour  Intake 716.65 ml  Output --  Net 716.65 ml   Last 3 Weights 12/12/2019 12/12/2019 12/07/2019  Weight (lbs) 151 lb 0.2 oz 153 lb 153 lb  Weight (kg) 68.5 kg 69.4 kg 69.4 kg  Some encounter information is confidential and restricted. Go to Review Flowsheets activity to see all data.      Telemetry    NSR - Personally Reviewed  ECG    Sinus tachycardia at 101 bpm - Personally Reviewed  Physical Exam   GEN: No  acute distress.   Neck: No JVD Cardiac: RRR, no murmurs, rubs, or gallops.  Respiratory: Clear to auscultation bilaterally. GI: Soft, nontender, non-distended  MS: No edema; No deformity. Neuro:  Nonfocal  Psych: Normal affect   Labs    High Sensitivity Troponin:   Recent Labs  Lab 11/18/19 1234 11/18/19 1529 12/12/19 1154 12/12/19 1346  TROPONINIHS 8 6 9 14       Chemistry Recent Labs  Lab 12/07/19 1048 12/12/19 1154 12/13/19 0036  NA 141 140 141  K 3.8 3.7 3.8  CL 107* 107 109  CO2 23 23 24   GLUCOSE 100* 123* 98  BUN 14 19 17   CREATININE 1.84* 2.02* 1.81*  CALCIUM 8.8 9.2 8.9  GFRNONAA 31* 29* 33*  GFRAA 36*  --   --   ANIONGAP  --  10 8     Hematology Recent Labs  Lab 12/07/19 1048 12/12/19 1154 12/13/19 0036  WBC 6.6 6.0 6.0  RBC 4.31 4.56 3.90  HGB 11.6 12.0 10.3*  HCT 35.5 38.7 32.4*  MCV 82 84.9 83.1  MCH 26.9 26.3 26.4  MCHC 32.7 31.0 31.8  RDW 13.0 13.2 13.2  PLT 188 183 147*  BNPNo results for input(s): BNP, PROBNP in the last 168 hours.   DDimer No results for input(s): DDIMER in the last 168 hours.   Radiology    DG Chest 2 View  Result Date: 12/12/2019 CLINICAL DATA:  Chest pain. EXAM: CHEST - 2 VIEW COMPARISON:  11/18/2019. FINDINGS: Similar cardiac silhouette. Right-sided aortic arch with vascular ring, better characterized on prior CT chest. Surgical clips project in the region of the left thoracic inlet. Both lungs are clear. No visible pleural effusions or pneumothorax. No acute osseous abnormality. Cholecystectomy clips. IMPRESSION: No active cardiopulmonary disease. Electronically Signed   By: Margaretha Sheffield MD   On: 12/12/2019 12:24    Cardiac Studies   Pending cath today  Patient Profile     49 y.o. female with a history of coronary artery disease s/p PCI to LAD with drug-eluting stent placement, multiple balloon angioplasty on ostial first diagonal due to recurrent restenosis. Also with history of chronic diastolic  heart failure, stage 3/4 chronic kidney disease, prior CVA, left carotid to subclavian artery bypass, aortic arch aneurysm, anemia, obesity status post gastric bypass, pulmonary embolism, bipolar disorder, type 2 diabetes, essential hypertension, hyperlipidemia and frequent falls with syncope and presyncope. Presented to ER 12/12/19 with worsening chest pain, had already been planned for cath on 12/13/19  Assessment & Plan    CAD with prior interventions Chronic angina, with accelerating symptoms Hypertension Hyperlipidemia -hsTn unremarkable -we again discussed risks of cath, she wishes to proceed -hydrated overnight given her chronic kidney disease, Cr improved slightly this AM -continue rosuvastatin, imdur, carvedilol, amlodipine, aspirin, and clopidogrel -DOAC held for procedure -if no intervention performed, will determine if she could be discharged after cath  Chronic diastolic heart failure -tolerated gentle hydration overnight.  Chronic kidney disease, stage 3b today -tolerated pre-cath hydration -understands risk of contrast nephropathy and wishes to proceed  Concern for possible PE: suggested on VQ scan but no clot on CTA -had been on apixaban prior to cath, stopped in anticipation of procedure.  -unclear if this needs to be restarted post procedure, will defer to Dr. Fletcher Anon.  For questions or updates, please contact [place] Please consult [email or web address] for contact info under     Signed, Buford Dresser, MD  12/13/2019, 12:14 PM

## 2019-12-14 ENCOUNTER — Encounter (HOSPITAL_COMMUNITY): Payer: Self-pay | Admitting: Cardiovascular Disease

## 2019-12-14 DIAGNOSIS — I25118 Atherosclerotic heart disease of native coronary artery with other forms of angina pectoris: Secondary | ICD-10-CM | POA: Diagnosis not present

## 2019-12-14 DIAGNOSIS — R072 Precordial pain: Secondary | ICD-10-CM | POA: Diagnosis not present

## 2019-12-14 DIAGNOSIS — Z955 Presence of coronary angioplasty implant and graft: Secondary | ICD-10-CM

## 2019-12-14 LAB — BASIC METABOLIC PANEL
Anion gap: 5 (ref 5–15)
BUN: 18 mg/dL (ref 6–20)
CO2: 25 mmol/L (ref 22–32)
Calcium: 8.6 mg/dL — ABNORMAL LOW (ref 8.9–10.3)
Chloride: 111 mmol/L (ref 98–111)
Creatinine, Ser: 2.04 mg/dL — ABNORMAL HIGH (ref 0.44–1.00)
GFR, Estimated: 29 mL/min — ABNORMAL LOW (ref >60)
Glucose, Bld: 119 mg/dL — ABNORMAL HIGH (ref 70–99)
Potassium: 4.2 mmol/L (ref 3.5–5.1)
Sodium: 141 mmol/L (ref 135–145)

## 2019-12-14 LAB — CBC
HCT: 31.7 % — ABNORMAL LOW (ref 36.0–46.0)
Hemoglobin: 10.1 g/dL — ABNORMAL LOW (ref 12.0–15.0)
MCH: 27.2 pg (ref 26.0–34.0)
MCHC: 31.9 g/dL (ref 30.0–36.0)
MCV: 85.2 fL (ref 80.0–100.0)
Platelets: 131 10*3/uL — ABNORMAL LOW (ref 150–400)
RBC: 3.72 MIL/uL — ABNORMAL LOW (ref 3.87–5.11)
RDW: 13.2 % (ref 11.5–15.5)
WBC: 5.7 10*3/uL (ref 4.0–10.5)
nRBC: 0 % (ref 0.0–0.2)

## 2019-12-14 LAB — GLUCOSE, CAPILLARY: Glucose-Capillary: 81 mg/dL (ref 70–99)

## 2019-12-14 MED FILL — Nitroglycerin IV Soln 100 MCG/ML in D5W: INTRA_ARTERIAL | Qty: 10 | Status: AC

## 2019-12-14 NOTE — Progress Notes (Signed)
Discharge instructions given to patient, follow up appointments given and all questions were answered. 2 IV's removed without complication, site clean dry and intact. Patient's sister is here for pick up.

## 2019-12-14 NOTE — Progress Notes (Signed)
Progress Note  Patient Name: [name] Date of Encounter: 12/14/2019  Primary Cardiologist: Ida Rogue, MD   Subjective   [name] is resting in bed comfortably, stating she is doing well. Denies any episodes of chest pain or worsening of her baseline shortness of breath. She does endorse some right groin pain where the catheter was inserted, but otherwise feeling well. Denies fever, chills, nausea,vomiting, diarrhea. Eating and drinking well. Ambulated some once allowed to, had no complications with this.   Inpatient Medications    Scheduled Meds: . amLODipine  2.5 mg Oral QPM  . aspirin EC  81 mg Oral Daily  . buPROPion  200 mg Oral q morning - 10a  . carvedilol  12.5 mg Oral BID WC  . Chlorhexidine Gluconate Cloth  6 each Topical Q0600  . clopidogrel  75 mg Oral Daily  . gabapentin  300 mg Oral BID  . isosorbide mononitrate  120 mg Oral Daily  . mupirocin ointment  1 application Nasal BID  . pantoprazole  40 mg Oral BID  . rosuvastatin  40 mg Oral q1800  . sodium chloride flush  3 mL Intravenous Q12H  . sodium chloride flush  3 mL Intravenous Q12H   Continuous Infusions: . sodium chloride     PRN Meds: sodium chloride, acetaminophen, nitroGLYCERIN, ondansetron (ZOFRAN) IV, sodium chloride flush, traZODone   Vital Signs    Vitals:   12/13/19 2039 12/13/19 2344 12/14/19 0349 12/14/19 0830  BP: (!) 154/86 135/70 (!) 151/78 (!) 144/73  Pulse: 63   85  Resp: 15 17 14 20   Temp: 98.2 F (36.8 C) 98 F (36.7 C) 98.2 F (36.8 C) 98.1 F (36.7 C)  TempSrc: Oral Oral Oral Oral  SpO2: 100% 100% 100%   Weight:      Height:        Intake/Output Summary (Last 24 hours) at 12/14/2019 0847 Last data filed at 12/14/2019 0415 Gross per 24 hour  Intake 1623.34 ml  Output 1650 ml  Net -26.66 ml   Filed Weights   12/12/19 1141 12/12/19 2046  Weight: 69.4 kg 68.5 kg    Telemetry    No acute events. Rate of 61.- Personally Reviewed  ECG    Rate  of 61, normal sinus rhythm. Normal axis. Normal PR/QT intervals. ST wave changes in V1, expected post operatively  - Personally Reviewed  Physical Exam   GEN: No acute distress.   Neck: No JVD Cardiac: RRR,  rubs, or gallops. 2/6 systolic murmur Respiratory: Clear to auscultation bilaterally. GI: Soft, nontender, non-distended  MS: No edema; No deformity. Tender to palpate over insertion site of right groin. Soft to palpate. No diffuse swelling present. No firm mass palpated. No bruit heard.  Neuro:  Nonfocal  Psych: Normal affect    Labs    Chemistry Recent Labs  Lab 12/07/19 1048 12/07/19 1048 12/12/19 1154 12/13/19 0036 12/14/19 0100  NA 141  --  140 141 141  K 3.8   < > 3.7 3.8 4.2  CL 107*   < > 107 109 111  CO2 23   < > 23 24 25   GLUCOSE 100*  --  123* 98 119*  BUN 14  --  19 17 18   CREATININE 1.84*   < > 2.02* 1.81* 2.04*  CALCIUM 8.8   < > 9.2 8.9 8.6*  GFRNONAA 31*  --  29* 33* 29*  GFRAA 36*  --   --   --   --  ANIONGAP  --   --  10 8 5    < > = values in this interval not displayed.     Hematology Recent Labs  Lab 12/12/19 1154 12/13/19 0036 12/14/19 0100  WBC 6.0 6.0 5.7  RBC 4.56 3.90 3.72*  HGB 12.0 10.3* 10.1*  HCT 38.7 32.4* 31.7*  MCV 84.9 83.1 85.2  MCH 26.3 26.4 27.2  MCHC 31.0 31.8 31.9  RDW 13.2 13.2 13.2  PLT 183 147* 131*    Cardiac EnzymesNo results for input(s): TROPONINI in the last 168 hours. No results for input(s): TROPIPOC in the last 168 hours.   BNPNo results for input(s): BNP, PROBNP in the last 168 hours.   DDimer No results for input(s): DDIMER in the last 168 hours.   Radiology    DG Chest 2 View  Result Date: 12/12/2019 CLINICAL DATA:  Chest pain. EXAM: CHEST - 2 VIEW COMPARISON:  11/18/2019. FINDINGS: Similar cardiac silhouette. Right-sided aortic arch with vascular ring, better characterized on prior CT chest. Surgical clips project in the region of the left thoracic inlet. Both lungs are clear. No visible pleural  effusions or pneumothorax. No acute osseous abnormality. Cholecystectomy clips. IMPRESSION: No active cardiopulmonary disease. Electronically Signed   By: Margaretha Sheffield MD   On: 12/12/2019 12:24   CARDIAC CATHETERIZATION  Result Date: 12/13/2019  Non-stenotic 1st Diag lesion was previously treated.  Ost 1st Diag lesion is 40% stenosed.  Mid LAD-2 lesion is 40% stenosed.  Ost 2nd Diag to 2nd Diag lesion is 90% stenosed.  Mid LAD-1 lesion is 55% stenosed.  Prox Cx lesion is 40% stenosed.  Mid RCA lesion is 40% stenosed.  Mid LAD to Dist LAD lesion is 90% stenosed.  Post intervention, there is a 0% residual stenosis.  A drug-eluting stent was successfully placed using a STENT RESOLUTE ONYX [id number].  Dist LAD lesion is 70% stenosed.  1.  Patent stents in the mid LAD and first diagonal with mild to moderate in-stent restenosis, stable moderate mid RCA stenosis, stable diffuse disease in the superior branch of the first diagonal, moderate mid LAD stenosis at the bifurcation of second diagonal which has no significant ostial disease.  There is severe progression of stenosis in the mid to distal LAD.  In addition, there is moderately severe disease in the apical LAD. 2.  Normal left ventricular end-diastolic pressure. 3.  Successful angioplasty and drug-eluting stent placement to the mid to distal LAD. Recommendations: The patient has diffuse small vessel disease.  I elected to treat the most severe disease today and reevaluate her symptoms and response as an outpatient.  Given underlying advanced chronic kidney disease, I elected not to proceed with mid LAD/second diagonal bifurcation angioplasty especially that the disease in that area is not critical and will require bifurcation stenting.  Hydrate overnight and possible discharge home tomorrow. Continue dual antiplatelet therapy indefinitely. No need to resume Eliquis.  I do not think the patient had pulmonary embolism as the area on VQ scan was very  questionable and small overall with subsequent CTA of the pulmonary arteries showing no evidence of thrombus.    Cardiac Studies   Left Heart Catheterization 12/13/19  Non-stenotic 1st Diag lesion was previously treated.  Ost 1st Diag lesion is 40% stenosed.  Mid LAD-2 lesion is 40% stenosed.  Ost 2nd Diag to 2nd Diag lesion is 90% stenosed.  Mid LAD-1 lesion is 55% stenosed.  Prox Cx lesion is 40% stenosed.  Mid RCA lesion is 40% stenosed.  Mid  LAD to Dist LAD lesion is 90% stenosed.  Post intervention, there is a 0% residual stenosis.  A drug-eluting stent was successfully placed using a STENT RESOLUTE ONYX [id number].  Dist LAD lesion is 70% stenosed. 1.  Patent stents in the mid LAD and first diagonal with mild to moderate in-stent restenosis, stable moderate mid RCA stenosis, stable diffuse disease in the superior branch of the first diagonal, moderate mid LAD stenosis at the bifurcation of second diagonal which has no significant ostial disease.  There is severe progression of stenosis in the mid to distal LAD.  In addition, there is moderately severe disease in the apical LAD. 2.  Normal left ventricular end-diastolic pressure. 3.  Successful angioplasty and drug-eluting stent placement to the mid to distal LAD.   Patient Profile     49 y.o. female with a history of coronary artery disease s/p PCI to LAD with drug-eluting stent placement, multiple balloon angioplasty on ostial first diagonal due to recurrent restenosis. Also with history of chronic diastolic heart failure, stage 3/4 chronic kidney disease, prior CVA, left carotid to subclavian artery bypass, aortic arch aneurysm, anemia, obesity status post gastric bypass, pulmonary embolism, bipolar disorder, type 2 diabetes, essential hypertension, hyperlipidemia and frequent falls with syncope and presyncope.Presented to ER 12/12/19 with worsening chest pain, had already been planned for cath on 12/13/19  Assessment & Plan     CAD with prior interventions Chronic angina, with accelerating symptoms Hypertension Hyperlipidemia -catheterization yesterday, stent to mid/distal LAD.  -diffuse small vessel disease, no stent placed to mid LAD/2nd diag bifurcation in context of patient's advanced CKD, disease in the area not being critical, and requirement of bifurcation stenting -plan to reassess on outpatient basis and consider catheterization if patient continues to be symptomatic -continue to encourage PO fluids in context of worsening renal function this am -continue rosuvastatin, imdur, carvedilol, amlodipine, aspirin, and clopidogrel at discharge  Chronic diastolic heart failure -Tolerated fluids yesterday, does not appear volume overloaded  Chronic kidney disease, stage 3b today -Cr of 2.04 from 1.81. Suspect worsening due to contrast from procedure and lack of fluid intake -encourage po fluids -consider additional fluids  Concern for possible PE: suggested on VQ scan but no clot on CTA -had been on apixaban prior to cath, stopped in anticipation of procedure.  -Per Dr. Tyrell Antonio note, will not continue at discharge  For questions or updates, please contact [facility] Please consult [email or web address] for contact info under Cardiology/STEMI.      Signed, Sanjuana Letters, MD  Internal Medicine PGY-1 12/14/2019, 8:47 AM

## 2019-12-14 NOTE — Discharge Instructions (Signed)
Groin Site Care °Refer to this sheet in the next few weeks. These instructions provide you with information on caring for yourself after your procedure. Your caregiver may also give you more specific instructions. Your treatment has been planned according to current medical practices, but problems sometimes occur. Call your caregiver if you have any problems or questions after your procedure. °HOME CARE INSTRUCTIONS °· You may shower 24 hours after the procedure. Remove the bandage (dressing) and gently wash the site with plain soap and water. Gently pat the site dry.  °· Do not apply powder or lotion to the site.  °· Do not sit in a bathtub, swimming pool, or whirlpool for 5 to 7 days.  °· No bending, squatting, or lifting anything over 10 pounds (4.5 kg) as directed by your caregiver.  °· Inspect the site at least twice daily.  °· Do not drive home if you are discharged the same day of the procedure. Have someone else drive you.  °· You may drive 24 hours after the procedure unless otherwise instructed by your caregiver.  °What to expect: °· Any bruising will usually fade within 1 to 2 weeks.  °· Blood that collects in the tissue (hematoma) may be painful to the touch. It should usually decrease in size and tenderness within 1 to 2 weeks.  °SEEK IMMEDIATE MEDICAL CARE IF: °· You have unusual pain at the groin site or down the affected leg.  °· You have redness, warmth, swelling, or pain at the groin site.  °· You have drainage (other than a small amount of blood on the dressing).  °· You have chills.  °· You have a fever or persistent symptoms for more than 72 hours.  °· You have a fever and your symptoms suddenly get worse.  °· Your leg becomes pale, cool, tingly, or numb.  °You have heavy bleeding from the site. Hold pressure on the site. . ° °

## 2019-12-14 NOTE — Discharge Summary (Signed)
Name: [name] MRN: [id number] DOB: [birth year] 49 y.o. PCP: [name], MD  Date of Admission: 12/12/2019 11:34 AM Date of Discharge: 12/14/19 Attending Physician: Dr. Buford Dresser  Discharge Diagnosis: Active Problems:   Chest pain    Discharge Medications: Allergies as of 12/14/2019      Reactions   Lipitor [atorvastatin] Other (See Comments)   Leg pains   Tramadol Other (See Comments)   Mouth feels like it's on fire      Medication List    STOP taking these medications   Eliquis 2.5 MG Tabs tablet Generic drug: apixaban     TAKE these medications   ALPRAZolam 1 MG tablet Commonly known as: XANAX Take 1 tablet by mouth twice daily as needed What changed: reasons to take this   amLODipine 2.5 MG tablet Commonly known as: NORVASC Take 1 tablet (2.5 mg total) by mouth daily. What changed: when to take this   aspirin EC 81 MG tablet Take 1 tablet (81 mg total) by mouth daily. Swallow whole.   buPROPion 200 MG 12 hr tablet Commonly known as: WELLBUTRIN SR Take 200 mg by mouth every morning.   carvedilol 12.5 MG tablet Commonly known as: COREG Take 1 tablet (12.5 mg total) by mouth 2 (two) times daily with a meal.   clopidogrel 75 MG tablet Commonly known as: PLAVIX Take 1 tablet by mouth once daily   desvenlafaxine 50 MG 24 hr tablet Commonly known as: PRISTIQ Take 50 mg by mouth daily.   Euthyrox 25 MCG tablet Generic drug: levothyroxine TAKE 1 TABLET BY MOUTH ONCE DAILY BEFORE BREAKFAST What changed: See the new instructions.   FIBER COMPLETE PO Take 3 capsules by mouth daily.   gabapentin 300 MG capsule Commonly known as: NEURONTIN Take 1 capsule (300 mg total) by mouth 2 (two) times daily.   HAIR SKIN & NAILS GUMMIES PO Take 4 tablets by mouth daily.   isosorbide mononitrate 120 MG 24 hr tablet Commonly known as: IMDUR Take 1 tablet (120 mg total) by mouth daily.   multivitamin capsule Take 1 capsule by  mouth daily. gummies   nitroGLYCERIN 0.4 MG SL tablet Commonly known as: NITROSTAT Place 1 tablet (0.4 mg total) under the tongue every 5 (five) minutes as needed for chest pain.   pantoprazole 40 MG tablet Commonly known as: PROTONIX Take 1 tablet by mouth twice daily   promethazine 25 MG tablet Commonly known as: PHENERGAN Take 25 mg by mouth every 6 (six) hours as needed for nausea or vomiting.   rosuvastatin 40 MG tablet Commonly known as: CRESTOR Take 1 tablet (40 mg total) by mouth daily at 6 PM.   SYSTANE OP Place 1 drop into both eyes daily as needed (dry eyes).   traZODone 100 MG tablet Commonly known as: DESYREL TAKE 1 TO 2 TABLETS BY MOUTH AT BEDTIME What changed:   when to take this  reasons to take this   Ubrelvy 100 MG Tabs Generic drug: Ubrogepant Take 100 mg by mouth 2 (two) times daily as needed (migraine).      Disposition and follow-up:   [name] was discharged from [facility] in Stable condition.  At the hospital follow up visit please address:  1.  Follow-up:  A. Post stent placement    B. Chronic Kidney Diease  2.  Labs / imaging needed at time of follow-up: BMP to assess creatinine, with PCP  3.  Pending labs/ test  needing follow-up: None  4.  Medication Changes  Started: None  Stopped: Eliquis  Abx - None    Follow-up Appointments:   Follow-up Information    Fisher, Kirstie Peri, MD Follow up.   Specialty: Family Medicine Contact information: [address] [place] [address] [place] NP Cardiology 12/22/19 10:30 am  Hospital Course by problem list:  Acute Exacerbation of Chest Pain in Context of Hx of CAD with prior stents Patient presented to the ED on 12/12/19 with worsening of her chronic chest pain for 5 days prior to admission. She was initially scheduled for a catheterization on 12/13/19, however, she felt the pain was severe and that she  needed to be seen in the ED. The patient described the pain as sharp in nature that progressed to heavy and radiated up her neck. She took multiple nitroglycerins over the 5 day span, that eased the pain. She was having difficulty performing her ADL's due to the dyspnea. Other associated symptoms of nausea and vomiting.  Her initial cardiac workup was unremarkable; negative troponin I, no acute EKG changes. She was hydrated prior to her procedure. She was taken to the cath lab on 12/13/19 by Dr. Fletcher Anon. Left heart cath was performed, found to have 90% stenosis of mid to dist LAD. Successful angioplasty and drug-eluting stent placed to the mid-distal LAD. There was an additional lesion found at the mid LAD/second diagonal bifurcation. Disease in the area was not thought to be critical and will require bifurcation stenting. Dr. Fletcher Anon decided to treat severe disease and reevaluate her symptoms/response as an outpatient. Post-operatively patient doing well, with no complaints of chest pain or shortness of breath. Had some discomfort of right groin from catheterization site, but not sign of infection,hematoma. Continued on dual antiplatelet therapy indefinitely. Patient with follow up appointment with NP Gilford Rile of cardiology on 12/22/19  Chronic Kidney Disease Stage 3b Patient presented with worsening of creatinine, 2.02. 5 days prior to admission, Cr of 1.84. Patient was given IV fluids prior and post catheterization. Encouraged PO intake and instructed patient to follow up with PCP.   Hypertension At discharge, patient's blood pressure improved, 144/73. Continued on home blood pressure medications of amlodipine and carvedilol  Discharge Vitals:   BP (!) 144/73 (BP Location: Left Arm)   Pulse 85   Temp 98.1 F (36.7 C) (Oral)   Resp 20   Ht 5\' 2"  (1.575 m)   Wt 68.5 kg   SpO2 100%   BMI 27.62 kg/m   Pertinent Labs, Studies, and Procedures:  CBC Latest Ref Rng & Units 12/14/2019 12/13/2019 12/12/2019   WBC 4.0 - 10.5 K/uL 5.7 6.0 6.0  Hemoglobin 12.0 - 15.0 g/dL 10.1(L) 10.3(L) 12.0  Hematocrit 36 - 46 % 31.7(L) 32.4(L) 38.7  Platelets 150 - 400 K/uL 131(L) 147(L) 183    CMP Latest Ref Rng & Units 12/14/2019 12/13/2019 12/12/2019  Glucose 70 - 99 mg/dL 119(H) 98 123(H)  BUN 6 - 20 mg/dL 18 17 19   Creatinine 0.44 - 1.00 mg/dL 2.04(H) 1.81(H) 2.02(H)  Sodium 135 - 145 mmol/L 141 141 140  Potassium 3.5 - 5.1 mmol/L 4.2 3.8 3.7  Chloride 98 - 111 mmol/L 111 109 107  CO2 22 - 32 mmol/L 25 24 23   Calcium 8.9 - 10.3 mg/dL 8.6(L) 8.9 9.2  Total Protein 6.5 - 8.1 g/dL - - -  Total Bilirubin 0.3 - 1.2 mg/dL - - -  Alkaline Phos 38 - 126 U/L - - -  AST 15 - 41 U/L - - -  ALT 0 - 44 U/L - - -    DG Chest 2 View  Result Date: 12/12/2019 CLINICAL DATA:  Chest pain. EXAM: CHEST - 2 VIEW COMPARISON:  11/18/2019. FINDINGS: Similar cardiac silhouette. Right-sided aortic arch with vascular ring, better characterized on prior CT chest. Surgical clips project in the region of the left thoracic inlet. Both lungs are clear. No visible pleural effusions or pneumothorax. No acute osseous abnormality. Cholecystectomy clips. IMPRESSION: No active cardiopulmonary disease. Electronically Signed   By: Margaretha Sheffield MD   On: 12/12/2019 12:24   CARDIAC CATHETERIZATION  Result Date: 12/13/2019  Non-stenotic 1st Diag lesion was previously treated.  Ost 1st Diag lesion is 40% stenosed.  Mid LAD-2 lesion is 40% stenosed.  Ost 2nd Diag to 2nd Diag lesion is 90% stenosed.  Mid LAD-1 lesion is 55% stenosed.  Prox Cx lesion is 40% stenosed.  Mid RCA lesion is 40% stenosed.  Mid LAD to Dist LAD lesion is 90% stenosed.  Post intervention, there is a 0% residual stenosis.  A drug-eluting stent was successfully placed using a STENT RESOLUTE ONYX [id number].  Dist LAD lesion is 70% stenosed.  1.  Patent stents in the mid LAD and first diagonal with mild to moderate in-stent restenosis, stable moderate mid RCA  stenosis, stable diffuse disease in the superior branch of the first diagonal, moderate mid LAD stenosis at the bifurcation of second diagonal which has no significant ostial disease.  There is severe progression of stenosis in the mid to distal LAD.  In addition, there is moderately severe disease in the apical LAD. 2.  Normal left ventricular end-diastolic pressure. 3.  Successful angioplasty and drug-eluting stent placement to the mid to distal LAD. Recommendations: The patient has diffuse small vessel disease.  I elected to treat the most severe disease today and reevaluate her symptoms and response as an outpatient.  Given underlying advanced chronic kidney disease, I elected not to proceed with mid LAD/second diagonal bifurcation angioplasty especially that the disease in that area is not critical and will require bifurcation stenting.  Hydrate overnight and possible discharge home tomorrow. Continue dual antiplatelet therapy indefinitely. No need to resume Eliquis.  I do not think the patient had pulmonary embolism as the area on VQ scan was very questionable and small overall with subsequent CTA of the pulmonary arteries showing no evidence of thrombus.     Discharge Instructions: Discharge Instructions    AMB Referral to Cardiac Rehabilitation - Phase II   Complete by: As directed    Diagnosis:  Coronary Stents Stable Angina     After initial evaluation and assessments completed: Virtual Based Care may be provided alone or in conjunction with Phase 2 Cardiac Rehab based on patient barriers.: Yes   Call MD for:  difficulty breathing, headache or visual disturbances   Complete by: As directed    Call MD for:  extreme fatigue   Complete by: As directed    Call MD for:  persistant dizziness or light-headedness   Complete by: As directed    Call MD for:  persistant nausea and vomiting   Complete by: As directed    Call MD for:  redness, tenderness, or signs of infection (pain, swelling,  redness, odor or green/yellow discharge around incision site)   Complete by: As directed    Call MD for:  severe uncontrolled pain   Complete by: As  directed    Call MD for:  temperature >100.4   Complete by: As directed    Diet - low sodium heart healthy   Complete by: As directed    Increase activity slowly   Complete by: As directed       Signed: Riesa Pope, MD  Internal Medicine Resident PGY-1 12/14/2019, 11:34 AM   Pager: [phone]

## 2019-12-14 NOTE — Progress Notes (Signed)
CARDIAC REHAB PHASE I   PRE:  Rate/Rhythm: 70 SR  BP:  Supine:   Sitting: 154/83  Standing:    SaO2: 98%RA  MODE:  Ambulation: 375 ft   POST:  Rate/Rhythm: 83 SR  BP:  Supine:   Sitting: 146/75  Standing:    SaO2: 96%RA 1100-1200 Pt walked 375 ft on RA with rollator and did not need to sit. C/o groin sore but gait steady. Checked groin site after walk and no bleeding. Reviewed NTG use, gave diabetic and heart healthy diets and encouraged walking as tolerated safely. Pt stated she has rollator at home. Discussed importance of plavix with stent. Pt stated she has been on plavix. Referral to [facility] CRP 2 so they can follow up.  To recliner with call bell.   Graylon Good, RN BSN  12/14/2019 11:56 AM

## 2019-12-18 DIAGNOSIS — F411 Generalized anxiety disorder: Secondary | ICD-10-CM | POA: Diagnosis not present

## 2019-12-18 DIAGNOSIS — F410 Panic disorder [episodic paroxysmal anxiety] without agoraphobia: Secondary | ICD-10-CM | POA: Diagnosis not present

## 2019-12-18 DIAGNOSIS — F3181 Bipolar II disorder: Secondary | ICD-10-CM | POA: Diagnosis not present

## 2019-12-22 ENCOUNTER — Encounter: Payer: Self-pay | Admitting: Family Medicine

## 2019-12-22 ENCOUNTER — Other Ambulatory Visit: Payer: Self-pay

## 2019-12-22 ENCOUNTER — Ambulatory Visit (INDEPENDENT_AMBULATORY_CARE_PROVIDER_SITE_OTHER): Payer: Medicare Other | Admitting: Family

## 2019-12-22 ENCOUNTER — Encounter: Payer: Self-pay | Admitting: Family

## 2019-12-22 VITALS — BP 122/74 | HR 66 | Ht 62.0 in | Wt 152.5 lb

## 2019-12-22 DIAGNOSIS — I25118 Atherosclerotic heart disease of native coronary artery with other forms of angina pectoris: Secondary | ICD-10-CM | POA: Diagnosis not present

## 2019-12-22 DIAGNOSIS — I10 Essential (primary) hypertension: Secondary | ICD-10-CM

## 2019-12-22 DIAGNOSIS — E785 Hyperlipidemia, unspecified: Secondary | ICD-10-CM

## 2019-12-22 DIAGNOSIS — I5022 Chronic systolic (congestive) heart failure: Secondary | ICD-10-CM

## 2019-12-22 DIAGNOSIS — G629 Polyneuropathy, unspecified: Secondary | ICD-10-CM

## 2019-12-22 MED ORDER — CLOPIDOGREL BISULFATE 75 MG PO TABS: 75.0000 mg | ORAL_TABLET | Freq: Every day | ORAL | 3 refills | Status: DC

## 2019-12-22 NOTE — Patient Instructions (Addendum)
Medication Instructions:  No medication changes today. We have sent a refill of your Plavix to your pharmacy.  *If you need a refill on your cardiac medications before your next appointment, please call your pharmacy*  Lab Work: Your provider recommends lab work today: BMP  If you have labs (blood work) drawn today and your tests are completely normal, you will receive your results only by: Marland Kitchen MyChart Message (if you have MyChart) OR . A paper copy in the mail If you have any lab test that is abnormal or we need to change your treatment, we will call you to review the results.  Testing/Procedures: Your EKG today shows normal sinus rhythm.   Follow-Up: At [facility], you and your health needs are our priority.  As part of our continuing mission to provide you with exceptional heart care, we have created designated Provider Care Teams.  These Care Teams include your primary Cardiologist (physician) and Advanced Practice Providers (APPs -  Physician Assistants and Nurse Practitioners) who all work together to provide you with the care you need, when you need it.  We recommend signing up for the patient portal called "MyChart".  Sign up information is provided on this After Visit Summary.  MyChart is used to connect with patients for Virtual Visits (Telemedicine).  Patients are able to view lab/test results, encounter notes, upcoming appointments, etc.  Non-urgent messages can be sent to your provider as well.   To learn more about what you can do with MyChart, go to [email or web address].    Your next appointment:   2 month(s)  The format for your next appointment:   In Person  Provider:  Dr. [name] or Dr. Fletcher Anon

## 2019-12-22 NOTE — Progress Notes (Signed)
Office Visit    Patient Name: [name] Date of Encounter: 12/22/2019  Primary Care Provider:  [name], MD Primary Cardiologist:  Ida Rogue, MD Electrophysiologist:  None   Chief Complaint    [name] is a 49 y.o. female with a hx of CAD s/p PCI to LAD with DES, HFpEF, gr1DD, CKD3, prior CVA, left carotid to subclavian artery bypass, aortic arch aneurysm, anemia, obesity s/p gastric bypass, bipolar disorder, DM2, HTN, HLD, frequent falls with syncope and syncope presents today for follow-up after cardiac catheterization  Past Medical History    Past Medical History:  Diagnosis Date  . Acute colitis 01/27/2017  . Acute pyelonephritis   . Anemia    iron deficiency anemia  . Aortic arch aneurysm ([place])   . BRCA negative 2014  . CAD (coronary artery disease)    a. 08/2003 Cath: LAD 30-40-med Rx; b. 11/2014 PCI: LAD 38m(3.25x23 Xience Alpine DES); c. 06/2015 PCI: D1 (2.25x12 Resolute Integrity DES); d. 06/2017 PCI: Patent mLAD stent, D2 95 (PTCA); e. 09/2017 PCI: D2 99ost (CBA); d. 12/2017 Cath: LM nl, LAD 3729m80d (small), D1 40ost, D2 95ost, LCX 40p, RCA 40ost/p->Med rx for D2 given restenosis.  . Colitis 06/03/2015  . Colon polyp   . CVA (cerebral vascular accident) ([facility]   Left side weakness.   . Degenerative tear of glenoid labrum of right shoulder 03/15/2017  . Diabetes mellitus without complication ([facility] [place]  . Family history of breast cancer    BRCA neg 2014  . Femur fracture, left ([place]9/10/2018  . Gastric ulcer 04/27/2011  . History of echocardiogram    a. 03/2017 Echo: EF 60-65%, no rwma; b. 02/2018 Echo: EF 60-65%, no rwma. Nl RV fxn. No cardiac source of emboli (admitted w/ stroke).  . Hypertension   . Malignant melanoma of skin of scalp ([facility]  . MI, acute, non ST segment elevation ([facility]  . Neuromuscular disorder ([facility]  . S/P drug eluting coronary stent placement 06/04/2015  . Sepsis ([facility]2/14/2019  . [facility]   a. 02/2018  MRI: 29m53mate acute/early subacute L medial frontal lobe inarct; b. 02/2018 MRA No large vessel occlusion or aneurysm. Mod to sev L P2 stenosis. thready L vertebral artery, diffusely dzs'd; c. 02/2018 Carotid U/S: <50% bilat ICA dzs.   Past Surgical History:  Procedure Laterality Date  . APPENDECTOMY    . CARDIAC CATHETERIZATION N/A 11/09/2014   Procedure: Coronary Angiography;  Surgeon: TimMinna MerrittsD;  [facility];  Service: Cardiovascular;  Laterality: N/A;  . CARDIAC CATHETERIZATION N/A 11/12/2014   Procedure: Coronary Stent Intervention;  Surgeon: AleIsaias CowmanD;  [facility];  Service: Cardiovascular;  Laterality: N/A;  . CARDIAC CATHETERIZATION N/A 04/18/2015   Procedure: Left Heart Cath and Coronary Angiography;  Surgeon: TimMinna MerrittsD;  [facility];  Service: Cardiovascular;  Laterality: N/A;  . CARDIAC CATHETERIZATION Left 06/04/2015   Procedure: Left Heart Cath and Coronary Angiography;  Surgeon: MuhWellington HampshireD;  [facility];  Service: Cardiovascular;  Laterality: Left;  . CARDIAC CATHETERIZATION N/A 06/04/2015   Procedure: Coronary Stent Intervention;  Surgeon: MuhWellington HampshireD;  [facility];  Service: Cardiovascular;  Laterality: N/A;  . CESAREAN SECTION  2001  . CHOLECYSTECTOMY N/A 11/18/2016   Procedure: LAPAROSCOPIC CHOLECYSTECTOMY WITH INTRAOPERATIVE CHOLANGIOGRAM;  Surgeon: SanChristene LyeD;  [facility];  Service: General;  Laterality: N/A;  . COLONOSCOPY WITH PROPOFOL N/A 04/27/2016   Procedure: COLONOSCOPY WITH PROPOFOL;  Surgeon: [name], MD;  Location: [place];  Service: Endoscopy;  Laterality: N/A;  . COLONOSCOPY WITH PROPOFOL N/A 01/12/2018   Procedure: COLONOSCOPY WITH PROPOFOL;  Surgeon: Toledo, Benay Pike, MD;  Location: [facility] ENDOSCOPY;  Service: Endoscopy;  Laterality: N/A;  . CORONARY ANGIOPLASTY    . CORONARY  BALLOON ANGIOPLASTY N/A 06/29/2017   Procedure: CORONARY BALLOON ANGIOPLASTY;  Surgeon: Wellington Hampshire, MD;  Location: [place] [facility] LAB;  Service: Cardiovascular;  Laterality: N/A;  . CORONARY BALLOON ANGIOPLASTY N/A 09/20/2017   Procedure: CORONARY BALLOON ANGIOPLASTY;  Surgeon: Wellington Hampshire, MD;  Location: [place] CV LAB;  Service: Cardiovascular;  Laterality: N/A;  . CORONARY STENT INTERVENTION N/A 12/13/2019   Procedure: CORONARY STENT INTERVENTION;  Surgeon: Wellington Hampshire, MD;  Location: [place] CV LAB;  Service: Cardiovascular;  Laterality: N/A;  . DILATION AND CURETTAGE OF UTERUS    . ESOPHAGOGASTRODUODENOSCOPY (EGD) WITH PROPOFOL N/A 09/14/2014   Procedure: ESOPHAGOGASTRODUODENOSCOPY (EGD) WITH PROPOFOL;  Surgeon: Josefine Class, MD;  Location: [facility] ENDOSCOPY;  Service: Endoscopy;  Laterality: N/A;  . ESOPHAGOGASTRODUODENOSCOPY (EGD) WITH PROPOFOL N/A 04/27/2016   Procedure: ESOPHAGOGASTRODUODENOSCOPY (EGD) WITH PROPOFOL;  Surgeon: [name], MD;  Location: [place];  Service: Endoscopy;  Laterality: N/A;  Diabetic - oral meds  . ESOPHAGOGASTRODUODENOSCOPY (EGD) WITH PROPOFOL N/A 01/12/2018   Procedure: ESOPHAGOGASTRODUODENOSCOPY (EGD) WITH PROPOFOL;  Surgeon: Toledo, Benay Pike, MD;  Location: [facility]OSCOPY;  Service: Endoscopy;  Laterality: N/A;  . ESOPHAGOGASTRODUODENOSCOPY (EGD) WITH PROPOFOL N/A 04/11/2019   Procedure: ESOPHAGOGASTRODUODENOSCOPY (EGD) WITH PROPOFOL;  Surgeon: [name], MD;  Location: [facility] ENDOSCOPY;  Service: Endoscopy;  Laterality: N/A;  . GASTRIC BYPASS  09/2009   [place] (IM) NAIL INTERTROCHANTERIC Left 10/13/2018   Procedure: INTRAMEDULLARY (IM) NAIL INTERTROCHANTRIC;  Surgeon: Leandrew Koyanagi, MD;  Location: [place];  Service: Orthopedics;  Laterality: Left;  . Left Carotid to sublcavian artery bypass w/ subclavian artery ligation     a. Performed @ [place].  . LEFT HEART CATH AND  CORONARY ANGIOGRAPHY Left 06/29/2017   Procedure: LEFT HEART CATH AND CORONARY ANGIOGRAPHY;  Surgeon: Wellington Hampshire, MD;  Location: [place] CV LAB;  Service: Cardiovascular;  Laterality: Left;  . LEFT HEART CATH AND CORONARY ANGIOGRAPHY N/A 09/20/2017   Procedure: LEFT HEART CATH AND CORONARY ANGIOGRAPHY;  Surgeon: Wellington Hampshire, MD;  Location: [place] CV LAB;  Service: Cardiovascular;  Laterality: N/A;  . LEFT HEART CATH AND CORONARY ANGIOGRAPHY N/A 12/20/2017   Procedure: LEFT HEART CATH AND CORONARY ANGIOGRAPHY;  Surgeon: Wellington Hampshire, MD;  Location: [place] CV LAB;  Service: Cardiovascular;  Laterality: N/A;  . LEFT HEART CATH AND CORONARY ANGIOGRAPHY N/A 04/20/2019   Procedure: LEFT HEART CATH AND CORONARY ANGIOGRAPHY possible PCI;  Surgeon: Minna Merritts, MD;  Location: [place] CV LAB;  Service: Cardiovascular;  Laterality: N/A;  . LEFT HEART CATH AND CORONARY ANGIOGRAPHY N/A 12/13/2019   Procedure: LEFT HEART CATH AND CORONARY ANGIOGRAPHY;  Surgeon: Wellington Hampshire, MD;  Location: [place] CV LAB;  Service: Cardiovascular;  Laterality: N/A;  . MELANOMA EXCISION  2016   Dr. Evorn Gong  . [place]  2002  . RIGHT OOPHORECTOMY    . SHOULDER ARTHROSCOPY WITH OPEN ROTATOR CUFF REPAIR Right 01/07/2016   Procedure: SHOULDER ARTHROSCOPY WITH DEBRIDMENT, SUBACHROMIAL DECOMPRESSION;  Surgeon: Marshall Cork  Poggi, MD;  Location: [facility] ORS;  Service: Orthopedics;  Laterality: Right;  . SHOULDER ARTHROSCOPY WITH OPEN ROTATOR CUFF REPAIR Right 03/16/2017   Procedure: SHOULDER ARTHROSCOPY WITH OPEN ROTATOR CUFF REPAIR POSSIBLE BICEPS TENODESIS;  Surgeon: Corky Mull, MD;  Location: [facility] ORS;  Service: Orthopedics;  Laterality: Right;  . TRIGGER FINGER RELEASE Right     Middle Finger    Allergies  Allergies  Allergen Reactions  . Lipitor [Atorvastatin] Other (See Comments)    Leg pains  . Tramadol Other (See Comments)    Mouth feels like it's on fire     History of Present Illness    Haely Marita [place] Tobin [name] is a 49 y.o. female with a hx of CAD s/p PCI to LAD with DES, HFpEF, gr1DD, CKD3, prior CVA, left carotid to subclavian artery bypass, aortic arch aneurysm, anemia, obesity s/p gastric bypass, bipolar disorder, DM2, HTN, HLD, frequent falls with syncope and syncope last seen while hospitalized.  She presented to the ED 12/12/2019 with worsening of chronic chest pain for 5 days prior to admission.  She was scheduled for [facility] 12/13/2019 however felt she needed to be seen in the ED.  Initial cardiac work-up unremarkable, negative troponin, no EKG changes.  She was hydrated prior to procedure and taken to Cath Lab 12/13/2019 by Dr. Fletcher Anon.  LHC with 9% stenosis of mid to distal LAD s/p successful angioplasty and DES.  Additional lesion in mid LAD/second diagonal bifurcation disease not felt to be critical and will require bifurcation stenting.  Dr. Fletcher Anon decided to treat severe disease due to underlying CKD.  Recommended for indefinite DAPT.  Her Eliquis was discontinued as VQ scan very questionable and small with subsequent CTA of pulmonary arteries with no evidence of thrombus.  Presents today for follow up. She reports her chest pain resolved after cardiac cath however, tells me Wednesday she started having some intermittent right sided chest pain. She has not had to take any of her nitroglycerin. Tells me it is a dull pain. Radiates to her midsternal region. Denies indigestion. Tells me it is occurring every other day. It lasts about 10 minutes. Tells it is more dull compared to her previous chest pain. Tells me she is short of breath at rest and with exertion. This is overall unchanged from previous. No cough nor wheeze.   She tells me she has been hurting "real bad" in her left leg since she was in the hospital. Hurts in her left foot all the way up her leg. Tells me it hurts when she is moving as well as sitting still but better when sitting still.  Feels similar to her previous neuropathy.   EKGs/Labs/Other Studies Reviewed:   The following studies were reviewed today:  LHC 12/13/2019  Non-stenotic 1st Diag lesion was previously treated.  Ost 1st Diag lesion is 40% stenosed.  Mid LAD-2 lesion is 40% stenosed.  Ost 2nd Diag to 2nd Diag lesion is 90% stenosed.  Mid LAD-1 lesion is 55% stenosed.  Prox Cx lesion is 40% stenosed.  Mid RCA lesion is 40% stenosed.  Mid LAD to Dist LAD lesion is 90% stenosed.  Post intervention, there is a 0% residual stenosis.  A drug-eluting stent was successfully placed using a STENT RESOLUTE ONYX [id number].  Dist LAD lesion is 70% stenosed.   1.  Patent stents in the mid LAD and first diagonal with mild to moderate in-stent restenosis, stable moderate mid RCA stenosis, stable diffuse disease in the superior branch of the first  diagonal, moderate mid LAD stenosis at the bifurcation of second diagonal which has no significant ostial disease.  There is severe progression of stenosis in the mid to distal LAD.  In addition, there is moderately severe disease in the apical LAD. 2.  Normal left ventricular end-diastolic pressure. 3.  Successful angioplasty and drug-eluting stent placement to the mid to distal LAD.   Recommendations: The patient has diffuse small vessel disease.  I elected to treat the most severe disease today and reevaluate her symptoms and response as an outpatient.  Given underlying advanced chronic kidney disease, I elected not to proceed with mid LAD/second diagonal bifurcation angioplasty especially that the disease in that area is not critical and will require bifurcation stenting.   Hydrate overnight and possible discharge home tomorrow. Continue dual antiplatelet therapy indefinitely. No need to resume Eliquis.  I do not think the patient had pulmonary embolism as the area on VQ scan was very questionable and small overall with subsequent CTA of the pulmonary arteries showing  no evidence of thrombus.  CT angio chest PE 11/18/2019 IMPRESSION: 1. No evidence of a pulmonary embolism. Specifically, no embolus is noted to the right upper lobe to correspond to the perfusion defect noted on the recent pulmonary perfusion exam. 2. Small patchy area of airspace opacity in the posterolateral right upper lobe. This is consistent with infection in the proper clinical setting, but could be due to atelectasis. 3. No other evidence of acute cardiopulmonary disease. 4. 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 Adjacent vascular clips are consistent with previous surgical treatment for this anomaly. These findings are stable from the prior chest CT. 5. Left coronary artery calcifications.  Echo 11/06/2019  1. Left ventricular ejection fraction, by estimation, is 60 to 65%. The  left ventricle has normal function. The left ventricle has no regional  wall motion abnormalities. There is mild left ventricular hypertrophy.  Left ventricular diastolic parameters  are consistent with Grade I diastolic dysfunction (impaired relaxation).   2. Right ventricular systolic function was not well visualized. The right  ventricular size is mildly enlarged.   3. Left atrial size was mildly dilated.   4. Right atrial size was mildly dilated.   5. The mitral valve is grossly normal. Trivial mitral valve  regurgitation.   6. The aortic valve was not well visualized. Aortic valve regurgitation  is not visualized.   EKG:  EKG is ordered today.  The ekg ordered today demonstrates NSR 66bpm.  Recent Labs: 10/17/2019: ALT 22 10/18/2019: TSH 1.544 10/20/2019: Magnesium 2.1 11/05/2019: B Natriuretic Peptide 24.9 12/14/2019: BUN 18; Creatinine, Ser 2.04; Hemoglobin 10.1; Platelets 131; Potassium 4.2; Sodium 141  Recent Lipid Panel    Component Value Date/Time   CHOL 94 11/06/2019 0519   CHOL 159 05/28/2016 1212   CHOL 197 12/30/2013 0355   TRIG 84  11/06/2019 0519   TRIG 261 (H) 12/30/2013 0355   HDL 29 (L) 11/06/2019 0519   HDL 34 (L) 05/28/2016 1212   HDL 28 (L) 12/30/2013 0355   CHOLHDL 3.2 11/06/2019 0519   VLDL 17 11/06/2019 0519   VLDL 52 (H) 12/30/2013 0355   LDLCALC 48 11/06/2019 0519   LDLCALC 103 (H) 12/08/2016 1221   LDLCALC 117 (H) 12/30/2013 0355   LDLDIRECT 83.3 01/26/2011 0913    Home Medications   Current Meds  Medication Sig  . ALPRAZolam (XANAX) 1 MG tablet Take 1 tablet by mouth twice daily as needed (Patient taking differently:  Take 1 mg by mouth 2 (two) times daily as needed for anxiety. )  . amLODipine (NORVASC) 2.5 MG tablet Take 1 tablet (2.5 mg total) by mouth daily. (Patient taking differently: Take 2.5 mg by mouth every evening. )  . aspirin EC 81 MG tablet Take 1 tablet (81 mg total) by mouth daily. Swallow whole.  . Biotin w/ Vitamins C & E (HAIR SKIN & NAILS GUMMIES PO) Take 4 tablets by mouth daily.  Marland Kitchen buPROPion (WELLBUTRIN SR) 200 MG 12 hr tablet Take 200 mg by mouth every morning.  . carvedilol (COREG) 12.5 MG tablet Take 1 tablet (12.5 mg total) by mouth 2 (two) times daily with a meal.  . clopidogrel (PLAVIX) 75 MG tablet Take 1 tablet (75 mg total) by mouth daily.  Marland Kitchen desvenlafaxine (PRISTIQ) 50 MG 24 hr tablet Take 50 mg by mouth daily.  Arna Medici 25 MCG tablet TAKE 1 TABLET BY MOUTH ONCE DAILY BEFORE BREAKFAST (Patient taking differently: Take 25 mcg by mouth daily before breakfast. )  . FIBER COMPLETE PO Take 3 capsules by mouth daily.   Marland Kitchen gabapentin (NEURONTIN) 300 MG capsule Take 1 capsule (300 mg total) by mouth 2 (two) times daily.  . isosorbide mononitrate (IMDUR) 120 MG 24 hr tablet Take 1 tablet (120 mg total) by mouth daily.  . Multiple Vitamin (MULTIVITAMIN) capsule Take 1 capsule by mouth daily. gummies  . nitroGLYCERIN (NITROSTAT) 0.4 MG SL tablet Place 1 tablet (0.4 mg total) under the tongue every 5 (five) minutes as needed for chest pain.  . pantoprazole (PROTONIX) 40 MG  tablet Take 1 tablet by mouth twice daily (Patient taking differently: Take 40 mg by mouth 2 (two) times daily. )  . Polyethyl Glycol-Propyl Glycol (SYSTANE OP) Place 1 drop into both eyes daily as needed (dry eyes).  . promethazine (PHENERGAN) 25 MG tablet Take 25 mg by mouth every 6 (six) hours as needed for nausea or vomiting.   . rosuvastatin (CRESTOR) 40 MG tablet Take 1 tablet (40 mg total) by mouth daily at 6 PM.  . traZODone (DESYREL) 100 MG tablet TAKE 1 TO 2 TABLETS BY MOUTH AT BEDTIME (Patient taking differently: Take 100-200 mg by mouth at bedtime as needed for sleep. )  . UBRELVY 100 MG TABS Take 100 mg by mouth 2 (two) times daily as needed (migraine).   . [DISCONTINUED] clopidogrel (PLAVIX) 75 MG tablet Take 1 tablet by mouth once daily (Patient taking differently: Take 75 mg by mouth daily. )     Review of Systems  All other systems reviewed and are otherwise negative except as noted above.  Physical Exam    VS:  BP 122/74 (BP Location: Left Arm, Patient Position: Sitting, Cuff Size: Normal)   Pulse 66   Ht '5\' 2"'  (1.575 m)   Wt 152 lb 8 oz (69.2 kg)   SpO2 97%   BMI 27.89 kg/m  , BMI Body mass index is 27.89 kg/m.  Wt Readings from Last 3 Encounters:  12/22/19 152 lb 8 oz (69.2 kg)  12/12/19 151 lb 0.2 oz (68.5 kg)  12/07/19 153 lb (69.4 kg)    GEN: Well nourished, well developed, in no acute distress. HEENT: normal. Neck: Supple, no JVD, carotid bruits, or masses. Cardiac: RRR, no murmurs, rubs, or gallops. No clubbing, cyanosis, edema.  Radials/PT 2+ and equal bilaterally.  Respiratory:  Respirations regular and unlabored, clear to auscultation bilaterally. GI: Soft, nontender, nondistended. MS: No deformity or atrophy. Skin: Warm and dry, no rash.  R groin site with mild ecchymosis, no hematoma, no signs of infection.  Neuro:  Strength and sensation are intact. Psych: Normal affect.  Assessment & Plan    1. CAD- Right groin site healing appropriately from  recent cath and DES to mid-distal LAD, only mild ecchymosis. EKG today shows NSR with no acute ST/T wave changes. GDMT includes Aspirin, Coreg, PLavix, Imdur, Rosuvastatin, PRN Nitroglycerin. Reports only dull right sided chest pain which is somewhat atypical for angina as it occurs at rest. Longstanding history of chronic chest pain with significant small vessel disease by catheterization. We discussed continuing her present plan of care to continue to assess her response to recent DES. She was agreeable to this plan.   2. LLE pain - Consistent with prior neuropathy. Encouraged to discuss with her PCP regarding her Gabapentin dose. Bilateral PT 2+ on exam. She prefers to discuss with her primary care provider and assess response to potential gabapentin dose increase as symptoms are consistent with neuropathy. Defer ABI at this time but could consider in the future.   3. CKDIII- Careful titration of diuretics and antihypertensives.   4. HTN- BP well controlled. Continue current antihypertensive regimen.   5. HLD- Continue Rosuvastatin 2m daily.   Disposition: Follow up in 2 month(s) with Dr. GRockey Situor Dr. AFletcher Anon  Signed, CLoel Dubonnet NP 12/22/2019, 4:46 PM [facility]

## 2019-12-23 LAB — BASIC METABOLIC PANEL
BUN/Creatinine Ratio: 12 (ref 9–23)
BUN: 23 mg/dL (ref 6–24)
CO2: 21 mmol/L (ref 20–29)
Calcium: 8.6 mg/dL — ABNORMAL LOW (ref 8.7–10.2)
Chloride: 106 mmol/L (ref 96–106)
Creatinine, Ser: 1.86 mg/dL — ABNORMAL HIGH (ref 0.57–1.00)
GFR calc Af Amer: 36 mL/min/{1.73_m2} — ABNORMAL LOW (ref >59)
GFR calc non Af Amer: 31 mL/min/{1.73_m2} — ABNORMAL LOW (ref >59)
Glucose: 126 mg/dL — ABNORMAL HIGH (ref 65–99)
Potassium: 4.7 mmol/L (ref 3.5–5.2)
Sodium: 142 mmol/L (ref 134–144)

## 2019-12-25 NOTE — Telephone Encounter (Deleted)
Yes, you can increase the gabapentin to 600mg  twice a day if it's not making you sleepy or tired. If you still have plenty of the 300mg  capsules, you can take two at a time, otherwise I can send in a new prescription for the 600mg  tablets. Let me know if you need a new prescription.   Dr. Caryn Section

## 2019-12-26 ENCOUNTER — Other Ambulatory Visit: Payer: Self-pay

## 2019-12-26 ENCOUNTER — Encounter: Payer: Medicare Other | Attending: Cardiovascular Disease

## 2019-12-26 DIAGNOSIS — Z955 Presence of coronary angioplasty implant and graft: Secondary | ICD-10-CM

## 2019-12-26 NOTE — Progress Notes (Signed)
Virtual Visit completed. Patient informed on EP and RD appointment and 6 Minute walk test. Patient also informed of patient health questionnaires on My Chart. Patient Verbalizes understanding. Visit diagnosis can be found in [facility] 12/13/2019.

## 2019-12-27 DIAGNOSIS — M84374A Stress fracture, right foot, initial encounter for fracture: Secondary | ICD-10-CM | POA: Diagnosis not present

## 2019-12-27 DIAGNOSIS — M216X1 Other acquired deformities of right foot: Secondary | ICD-10-CM | POA: Diagnosis not present

## 2019-12-27 DIAGNOSIS — M205X1 Other deformities of toe(s) (acquired), right foot: Secondary | ICD-10-CM | POA: Diagnosis not present

## 2019-12-27 DIAGNOSIS — M2041 Other hammer toe(s) (acquired), right foot: Secondary | ICD-10-CM | POA: Diagnosis not present

## 2019-12-27 DIAGNOSIS — E1142 Type 2 diabetes mellitus with diabetic polyneuropathy: Secondary | ICD-10-CM | POA: Diagnosis not present

## 2019-12-27 DIAGNOSIS — I739 Peripheral vascular disease, unspecified: Secondary | ICD-10-CM | POA: Diagnosis not present

## 2019-12-27 DIAGNOSIS — M79674 Pain in right toe(s): Secondary | ICD-10-CM | POA: Diagnosis not present

## 2019-12-27 DIAGNOSIS — M79671 Pain in right foot: Secondary | ICD-10-CM | POA: Diagnosis not present

## 2019-12-27 DIAGNOSIS — M205X2 Other deformities of toe(s) (acquired), left foot: Secondary | ICD-10-CM | POA: Diagnosis not present

## 2019-12-27 DIAGNOSIS — L603 Nail dystrophy: Secondary | ICD-10-CM | POA: Diagnosis not present

## 2019-12-27 DIAGNOSIS — M216X2 Other acquired deformities of left foot: Secondary | ICD-10-CM | POA: Diagnosis not present

## 2019-12-27 DIAGNOSIS — M2042 Other hammer toe(s) (acquired), left foot: Secondary | ICD-10-CM | POA: Diagnosis not present

## 2019-12-27 DIAGNOSIS — M79675 Pain in left toe(s): Secondary | ICD-10-CM | POA: Diagnosis not present

## 2020-01-01 ENCOUNTER — Encounter: Payer: Self-pay | Admitting: Family Medicine

## 2020-01-02 ENCOUNTER — Other Ambulatory Visit (INDEPENDENT_AMBULATORY_CARE_PROVIDER_SITE_OTHER): Payer: Self-pay | Admitting: Vascular Surgery

## 2020-01-02 ENCOUNTER — Telehealth: Payer: Self-pay | Admitting: Gastroenterology

## 2020-01-02 DIAGNOSIS — I739 Peripheral vascular disease, unspecified: Secondary | ICD-10-CM

## 2020-01-02 NOTE — Telephone Encounter (Signed)
See we can do about getting this patient in sooner if not then I have seen her in the past that she should be set up for an EGD and colonoscopy for her diarrhea and dysphagia.

## 2020-01-02 NOTE — Telephone Encounter (Signed)
Patient states she is having fecal incontinence throughout the day and at night. Yesterday she took 5 imodium's. Pt states she is also having trouble swallowing. There are no appts till Jan. Please advise on scheduling and let pt know.

## 2020-01-04 ENCOUNTER — Encounter: Payer: Medicare Other | Attending: Cardiovascular Disease

## 2020-01-04 ENCOUNTER — Other Ambulatory Visit: Payer: Self-pay

## 2020-01-04 VITALS — Ht 63.0 in | Wt 153.1 lb

## 2020-01-04 DIAGNOSIS — Z955 Presence of coronary angioplasty implant and graft: Secondary | ICD-10-CM | POA: Insufficient documentation

## 2020-01-04 DIAGNOSIS — Z79899 Other long term (current) drug therapy: Secondary | ICD-10-CM | POA: Diagnosis not present

## 2020-01-04 NOTE — Progress Notes (Addendum)
Cardiac Individual Treatment Plan  Patient Details  Name: [name] MRN: [id number] Date of Birth: 28-Apr-1970 Referring Provider:     Cardiac Rehab from 01/04/2020 in [facility] Cardiac and Pulmonary Rehab  Referring Provider Kathlyn Sacramento MD      Initial Encounter Date:    Cardiac Rehab from 01/04/2020 in [facility] Cardiac and Pulmonary Rehab  Date 01/04/20      Visit Diagnosis: Status post coronary artery stent placement  Patient's Home Medications on Admission:  Current Outpatient Medications:  .  ALPRAZolam (XANAX) 1 MG tablet, Take 1 tablet by mouth twice daily as needed (Patient taking differently: Take 1 mg by mouth 2 (two) times daily as needed for anxiety. ), Disp: 60 tablet, Rfl: 3 .  amLODipine (NORVASC) 2.5 MG tablet, Take 1 tablet (2.5 mg total) by mouth daily. (Patient taking differently: Take 2.5 mg by mouth every evening. ), Disp: 90 tablet, Rfl: 3 .  aspirin EC 81 MG tablet, Take 1 tablet (81 mg total) by mouth daily. Swallow whole., Disp: , Rfl:  .  Biotin w/ Vitamins C & E (HAIR SKIN & NAILS GUMMIES PO), Take 4 tablets by mouth daily. (Patient not taking: Reported on 12/26/2019), Disp: , Rfl:  .  buPROPion (WELLBUTRIN SR) 200 MG 12 hr tablet, Take 200 mg by mouth every morning., Disp: , Rfl:  .  carvedilol (COREG) 12.5 MG tablet, Take 1 tablet (12.5 mg total) by mouth 2 (two) times daily with a meal., Disp: 60 tablet, Rfl: 3 .  clopidogrel (PLAVIX) 75 MG tablet, Take 1 tablet (75 mg total) by mouth daily., Disp: 90 tablet, Rfl: 3 .  desvenlafaxine (PRISTIQ) 50 MG 24 hr tablet, Take 50 mg by mouth daily., Disp: , Rfl:  .  EUTHYROX 25 MCG tablet, TAKE 1 TABLET BY MOUTH ONCE DAILY BEFORE BREAKFAST (Patient taking differently: Take 25 mcg by mouth daily before breakfast. ), Disp: 90 tablet, Rfl: 3 .  FIBER COMPLETE PO, Take 3 capsules by mouth daily. , Disp: , Rfl:  .  gabapentin (NEURONTIN) 300 MG capsule, Take 1 capsule (300 mg total) by mouth 2 (two) times daily.,  Disp: 180 capsule, Rfl: 4 .  isosorbide mononitrate (IMDUR) 120 MG 24 hr tablet, Take 1 tablet (120 mg total) by mouth daily., Disp: 30 tablet, Rfl: 3 .  Multiple Vitamin (MULTIVITAMIN) capsule, Take 1 capsule by mouth daily. gummies, Disp: , Rfl:  .  nitroGLYCERIN (NITROSTAT) 0.4 MG SL tablet, Place 1 tablet (0.4 mg total) under the tongue every 5 (five) minutes as needed for chest pain., Disp: 25 tablet, Rfl: 2 .  pantoprazole (PROTONIX) 40 MG tablet, Take 1 tablet by mouth twice daily (Patient taking differently: Take 40 mg by mouth 2 (two) times daily. ), Disp: 180 tablet, Rfl: 1 .  Polyethyl Glycol-Propyl Glycol (SYSTANE OP), Place 1 drop into both eyes daily as needed (dry eyes)., Disp: , Rfl:  .  promethazine (PHENERGAN) 25 MG tablet, Take 25 mg by mouth every 6 (six) hours as needed for nausea or vomiting. , Disp: , Rfl:  .  rosuvastatin (CRESTOR) 40 MG tablet, Take 1 tablet (40 mg total) by mouth daily at 6 PM., Disp: 90 tablet, Rfl: 3 .  traZODone (DESYREL) 100 MG tablet, TAKE 1 TO 2 TABLETS BY MOUTH AT BEDTIME (Patient taking differently: Take 100-200 mg by mouth at bedtime as needed for sleep. ), Disp: 30 tablet, Rfl: 5 .  UBRELVY 100 MG TABS, Take 100 mg by mouth 2 (two) times daily  as needed (migraine). , Disp: , Rfl:   Past Medical History: Past Medical History:  Diagnosis Date  . Acute colitis 01/27/2017  . Acute pyelonephritis   . Anemia    iron deficiency anemia  . Aortic arch aneurysm ([place])   . BRCA negative 2014  . CAD (coronary artery disease)    a. 08/2003 Cath: LAD 30-40-med Rx; b. 11/2014 PCI: LAD 9m(3.25x23 Xience Alpine DES); c. 06/2015 PCI: D1 (2.25x12 Resolute Integrity DES); d. 06/2017 PCI: Patent mLAD stent, D2 95 (PTCA); e. 09/2017 PCI: D2 99ost (CBA); d. 12/2017 Cath: LM nl, LAD 373m80d (small), D1 40ost, D2 95ost, LCX 40p, RCA 40ost/p->Med rx for D2 given restenosis.  . Colitis 06/03/2015  . Colon polyp   . CVA (cerebral vascular accident) ([facility]   Left side  weakness.   . Degenerative tear of glenoid labrum of right shoulder 03/15/2017  . Diabetes mellitus without complication ([place]  . Family history of breast cancer    BRCA neg 2014  . Femur fracture, left ([facility]/10/2018  . Gastric ulcer 04/27/2011  . History of echocardiogram    a. 03/2017 Echo: EF 60-65%, no rwma; b. 02/2018 Echo: EF 60-65%, no rwma. Nl RV fxn. No cardiac source of emboli (admitted w/ stroke).  . Hypertension   . Malignant melanoma of skin of scalp ([place]  . MI, acute, non ST segment elevation ([place]  . Neuromuscular disorder ([place]  . S/P drug eluting coronary stent placement 06/04/2015  . Sepsis ([facility]2/14/2019  . [facility]   a. 02/2018 MRI: 16m100mate acute/early subacute L medial frontal lobe inarct; b. 02/2018 MRA No large vessel occlusion or aneurysm. Mod to sev L P2 stenosis. thready L vertebral artery, diffusely dzs'd; c. 02/2018 Carotid U/S: <50% bilat ICA dzs.    Tobacco Use: Social History   Tobacco Use  Smoking Status Former Smoker  . Packs/day: 0.25  . Years: 1.00  . Pack years: 0.25  . Types: Cigarettes  . Quit date: 08/31/1994  . Years since quitting: 25.3  Smokeless Tobacco Never Used  Tobacco Comment   quit 28 years ago    Labs: Recent Review Flowsheet Data    Labs for ITP Cardiac and Pulmonary Rehab Latest Ref Rng & Units 01/20/2019 01/28/2019 04/19/2019 09/03/2019 11/06/2019   Cholestrol 0 - 200 mg/dL - 106 125 - 94   LDLCALC 0 - 99 mg/dL - 40 76 - 48   LDLDIRECT mg/dL - - - - -   HDL >40 mg/dL - 56 34(L) - 29(L)   Trlycerides <150 mg/dL - 52 75 - 84   Hemoglobin A1c 4.8 - 5.6 % 5.9(H) 5.6 5.1 4.8 5.2   PHART 7.35 - 7.45 - - - - -   PCO2ART 32 - 48 mmHg - - - - -   HCO3 20.0 - 28.0 mmol/L - - - - -   ACIDBASEDEF 0.0 - 2.0 mmol/L - - - - -   O2SAT % - - - - -       Exercise Target Goals: Exercise Program Goal: Individual exercise prescription set using results from initial 6 min walk test and THRR while considering  patient's activity  barriers and safety.   Exercise Prescription Goal: Initial exercise prescription builds to 30-45 minutes a day of aerobic activity, 2-3 days per week.  Home exercise guidelines will be given to patient during program as part of exercise prescription that the participant will acknowledge.   Education: Aerobic  Exercise: - Group verbal and visual presentation on the components of exercise prescription. Introduces F.I.T.T principle from ACSM for exercise prescriptions.  Reviews F.I.T.T. principles of aerobic exercise including progression. Written material given at graduation.   Education: Resistance Exercise: - Group verbal and visual presentation on the components of exercise prescription. Introduces F.I.T.T principle from ACSM for exercise prescriptions  Reviews F.I.T.T. principles of resistance exercise including progression. Written material given at graduation.    Education: Exercise & Equipment Safety: - Individual verbal instruction and demonstration of equipment use and safety with use of the equipment.   Cardiac Rehab from 12/26/2019 in [facility] Cardiac and Pulmonary Rehab  Date 12/26/19  Educator [facility]  Instruction Review Code 1- Verbalizes Understanding      Education: Exercise Physiology & General Exercise Guidelines: - Group verbal and written instruction with models to review the exercise physiology of the cardiovascular system and associated critical values. Provides general exercise guidelines with specific guidelines to those with heart or lung disease.    Education: Flexibility, Balance, Mind/Body Relaxation: - Group verbal and visual presentation with interactive activity on the components of exercise prescription. Introduces F.I.T.T principle from ACSM for exercise prescriptions. Reviews F.I.T.T. principles of flexibility and balance exercise training including progression. Also discusses the mind body connection.  Reviews various relaxation techniques to help reduce and manage  stress (i.e. Deep breathing, progressive muscle relaxation, and visualization). Balance handout provided to take home. Written material given at graduation.   Activity Barriers & Risk Stratification:  Activity Barriers & Cardiac Risk Stratification - 01/04/20 1347      Activity Barriers & Cardiac Risk Stratification   Activity Barriers Shortness of Breath;Deconditioning;Muscular Weakness;Other (comment)    Comments Neuropathy, rod in left leg    Cardiac Risk Stratification High           6 Minute Walk:  6 Minute Walk    Row Name 01/04/20 1338         6 Minute Walk   Phase Initial     Distance 805 feet     Walk Time 6 minutes     # of Rest Breaks 0     MPH 1.52     METS 2.79     RPE 13     Perceived Dyspnea  1     VO2 Peak 9.77     Symptoms Yes (comment)     Comments Had anxiety     Resting HR 64 bpm     Resting BP 116/70     Resting Oxygen Saturation  99 %     Exercise Oxygen Saturation  during 6 min walk 96 %     Max Ex. HR 80 bpm     Max Ex. BP 126/68     2 Minute Post BP 110/68            Oxygen Initial Assessment:   Oxygen Re-Evaluation:   Oxygen Discharge (Final Oxygen Re-Evaluation):   Initial Exercise Prescription:  Initial Exercise Prescription - 01/04/20 1300      Date of Initial Exercise RX and Referring Provider   Date 01/04/20    Referring Provider Kathlyn Sacramento MD      Treadmill   MPH 1.3    Grade 0    Minutes 15    METs 2      NuStep   Level 1    SPM 80    Minutes 15    METs 2.7      REL-XR   Level 1  Speed 50    Minutes 15    METs 2.7      Biostep-RELP   Level 1    SPM 50    Minutes 15    METs 2.7      Prescription Details   Frequency (times per week) 2    Duration Progress to 30 minutes of continuous aerobic without signs/symptoms of physical distress      Intensity   THRR 40-80% of Max Heartrate 106-148    Ratings of Perceived Exertion 11-13    Perceived Dyspnea 0-4      Progression   Progression  Continue to progress workloads to maintain intensity without signs/symptoms of physical distress.      Resistance Training   Training Prescription Yes    Weight 3 lb    Reps 10-15           Perform Capillary Blood Glucose checks as needed.  Exercise Prescription Changes:  Exercise Prescription Changes    Row Name 01/04/20 1300             Response to Exercise   Blood Pressure (Admit) 116/70       Blood Pressure (Exercise) 126/68       Blood Pressure (Exit) 110/68       Heart Rate (Admit) 64 bpm       Heart Rate (Exercise) 80 bpm       Heart Rate (Exit) 65 bpm       Oxygen Saturation (Admit) 99 %       Oxygen Saturation (Exercise) 95 %       Oxygen Saturation (Exit) 99 %       Rating of Perceived Exertion (Exercise) 13       Perceived Dyspnea (Exercise) 1       Symptoms anxiety       Comments walk test results              Exercise Comments:   Exercise Goals and Review:  Exercise Goals    Row Name 01/04/20 1345             Exercise Goals   Increase Physical Activity Yes       Intervention Provide advice, education, support and counseling about physical activity/exercise needs.;Develop an individualized exercise prescription for aerobic and resistive training based on initial evaluation findings, risk stratification, comorbidities and participant's personal goals.       Expected Outcomes Short Term: Attend rehab on a regular basis to increase amount of physical activity.;Long Term: Add in home exercise to make exercise part of routine and to increase amount of physical activity.;Long Term: Exercising regularly at least 3-5 days a week.       Increase Strength and Stamina Yes       Intervention Provide advice, education, support and counseling about physical activity/exercise needs.;Develop an individualized exercise prescription for aerobic and resistive training based on initial evaluation findings, risk stratification, comorbidities and participant's personal  goals.       Expected Outcomes Short Term: Increase workloads from initial exercise prescription for resistance, speed, and METs.;Short Term: Perform resistance training exercises routinely during rehab and add in resistance training at home;Long Term: Improve cardiorespiratory fitness, muscular endurance and strength as measured by increased METs and functional capacity (6MWT)       Able to understand and use rate of perceived exertion (RPE) scale Yes       Intervention Provide education and explanation on how to use RPE scale  Expected Outcomes Short Term: Able to use RPE daily in rehab to express subjective intensity level;Long Term:  Able to use RPE to guide intensity level when exercising independently       Able to understand and use Dyspnea scale Yes       Intervention Provide education and explanation on how to use Dyspnea scale       Expected Outcomes Short Term: Able to use Dyspnea scale daily in rehab to express subjective sense of shortness of breath during exertion;Long Term: Able to use Dyspnea scale to guide intensity level when exercising independently       Knowledge and understanding of Target Heart Rate Range (THRR) Yes       Intervention Provide education and explanation of THRR including how the numbers were predicted and where they are located for reference       Expected Outcomes Short Term: Able to state/look up THRR;Short Term: Able to use daily as guideline for intensity in rehab;Long Term: Able to use THRR to govern intensity when exercising independently       Able to check pulse independently Yes       Intervention Provide education and demonstration on how to check pulse in carotid and radial arteries.;Review the importance of being able to check your own pulse for safety during independent exercise       Expected Outcomes Short Term: Able to explain why pulse checking is important during independent exercise;Long Term: Able to check pulse independently and accurately        Understanding of Exercise Prescription Yes       Intervention Provide education, explanation, and written materials on patient's individual exercise prescription       Expected Outcomes Short Term: Able to explain program exercise prescription;Long Term: Able to explain home exercise prescription to exercise independently       Improve claudication pain toleration; Improve walking ability Yes              Exercise Goals Re-Evaluation :   Discharge Exercise Prescription (Final Exercise Prescription Changes):  Exercise Prescription Changes - 01/04/20 1300      Response to Exercise   Blood Pressure (Admit) 116/70    Blood Pressure (Exercise) 126/68    Blood Pressure (Exit) 110/68    Heart Rate (Admit) 64 bpm    Heart Rate (Exercise) 80 bpm    Heart Rate (Exit) 65 bpm    Oxygen Saturation (Admit) 99 %    Oxygen Saturation (Exercise) 95 %    Oxygen Saturation (Exit) 99 %    Rating of Perceived Exertion (Exercise) 13    Perceived Dyspnea (Exercise) 1    Symptoms anxiety    Comments walk test results           Nutrition:  Target Goals: Understanding of nutrition guidelines, daily intake of sodium <159m, cholesterol <2050m calories 30% from fat and 7% or less from saturated fats, daily to have 5 or more servings of fruits and vegetables.  Education: All About Nutrition: -Group instruction provided by verbal, written material, interactive activities, discussions, models, and posters to present general guidelines for heart healthy nutrition including fat, fiber, MyPlate, the role of sodium in heart healthy nutrition, utilization of the nutrition label, and utilization of this knowledge for meal planning. Follow up email sent as well. Written material given at graduation.   Biometrics:  Pre Biometrics - 01/04/20 1347      Pre Biometrics   Height '5\' 3"'  (1.6 m)    Weight  153 lb 1.6 oz (69.4 kg)    BMI (Calculated) 27.13    Single Leg Stand 8.2 seconds             Nutrition Therapy Plan and Nutrition Goals:   Nutrition Assessments:  MEDIFICTS Score Key:  ?70 Need to make dietary changes   40-70 Heart Healthy Diet  ? 40 Therapeutic Level Cholesterol Diet   Picture Your Plate Scores:  <90 Unhealthy dietary pattern with much room for improvement.  41-50 Dietary pattern unlikely to meet recommendations for good health and room for improvement.  51-60 More healthful dietary pattern, with some room for improvement.   >60 Healthy dietary pattern, although there may be some specific behaviors that could be improved.    Nutrition Goals Re-Evaluation:   Nutrition Goals Discharge (Final Nutrition Goals Re-Evaluation):   Psychosocial: Target Goals: Acknowledge presence or absence of significant depression and/or stress, maximize coping skills, provide positive support system. Participant is able to verbalize types and ability to use techniques and skills needed for reducing stress and depression.   Education: Stress, Anxiety, and Depression - Group verbal and visual presentation to define topics covered.  Reviews how body is impacted by stress, anxiety, and depression.  Also discusses healthy ways to reduce stress and to treat/manage anxiety and depression.  Written material given at graduation.   Education: Sleep Hygiene -Provides group verbal and written instruction about how sleep can affect your health.  Define sleep hygiene, discuss sleep cycles and impact of sleep habits. Review good sleep hygiene tips.    Initial Review & Psychosocial Screening:  Initial Psych Review & Screening - 12/26/19 0843      Initial Review   Current issues with Current Psychotropic Meds;Current Anxiety/Panic;History of Depression;Current Depression;Current Stress Concerns;Current Sleep Concerns    Source of Stress Concerns Chronic Illness;Unable to perform yard/household activities    Comments Her son is a Airline 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 She  gets of out breath alot and has chest pains sometimes and needs to lay down. She has chest pain gets depressed and is scared to go out by herself.      Family Dynamics   Good Support System? Yes    Comments She can look to her son and her church family. [name] states that she has no one else to look to for support.      Barriers   Psychosocial barriers to participate in program The patient should benefit from training in stress management and relaxation.      Screening Interventions   Interventions To provide support and resources with identified psychosocial needs;Encouraged to exercise;Provide feedback about the scores to participant    Expected Outcomes Short Term goal: Utilizing psychosocial counselor, staff and physician to assist with identification of specific Stressors or current issues interfering with healing process. Setting desired goal for each stressor or current issue identified.;Long Term Goal: Stressors or current issues are controlled or eliminated.;Short Term goal: Identification and review with participant of any Quality of Life or Depression concerns found by scoring the questionnaire.;Long Term goal: The participant improves quality of Life and PHQ9 Scores as seen by post scores and/or verbalization of changes           Quality of Life Scores:   Quality of Life - 01/04/20 1351      Quality of Life   Select Quality of Life      Quality of Life Scores   Health/Function Pre 5.6 %    Socioeconomic Pre 18.86 %  Psych/Spiritual Pre 5.14 %    Family Pre 15.6 %    GLOBAL Pre 9.71 %          Scores of 19 and below usually indicate a poorer quality of life in these areas.  A difference of  2-3 points is a clinically meaningful difference.  A difference of 2-3 points in the total score of the Quality of Life Index has been associated with significant improvement in overall quality of life, self-image, physical symptoms, and general health in studies assessing change in  quality of life.  PHQ-9: Recent Review Flowsheet Data    Depression screen [facility] 2/9 01/04/2020 08/22/2019 08/22/2019 12/09/2018   Decreased Interest '1 3 3 3   ' Down, Depressed, Hopeless '3 3 3 3   ' PHQ - 2 Score '4 6 6 6   ' Altered sleeping '3 3 3 3   ' Tired, decreased energy '3 3 3 3   ' Change in appetite '3 3 3 3   ' Feeling bad or failure about yourself  '3 3 2 3   ' Trouble concentrating '3 3 3 3   ' Moving slowly or fidgety/restless 3 2 0 3   Suicidal thoughts 2  0 0 1   PHQ-9 Score '24 23 20 25   ' Difficult doing work/chores Very difficult Very difficult Very difficult Extremely dIfficult     Interpretation of Total Score  Total Score Depression Severity:  1-4 = Minimal depression, 5-9 = Mild depression, 10-14 = Moderate depression, 15-19 = Moderately severe depression, 20-27 = Severe depression   Psychosocial Evaluation and Intervention:  Psychosocial Evaluation - 12/26/19 0846      Psychosocial Evaluation & Interventions   Interventions Encouraged to exercise with the program and follow exercise prescription;Relaxation education;Stress management education    Comments Her son is a Airline 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 She gets of out breath alot and has chest pains sometimes and needs to lay down.    Expected Outcomes Short: Exercise regularly to support mental health and notify staff of any changes. Long: maintain mental health and well being through teaching of rehab or prescribed medications independently.    Continue Psychosocial Services  Follow up required by staff          [name] reported having depression and is currently on medication as well as seeing psychosocial services, however, she reported she is not happy with her treatment and wants to have someone to talk to for therapy. Patient's PHQ was a high score and we believe she would benefit from extra resources. EP contacted chaplain, Binnie Rail, who came and spoke with patient in person while she was at rehab for orientation.  Chaplain's conversation went well per patient and patient was able to talk about the help she wants. Will continue to follow up.   Psychosocial Re-Evaluation:   Psychosocial Discharge (Final Psychosocial Re-Evaluation):   Vocational Rehabilitation: Provide vocational rehab assistance to qualifying candidates.   Vocational Rehab Evaluation & Intervention:   Education: Education Goals: Education classes will be provided on a variety of topics geared toward better understanding of heart health and risk factor modification. Participant will state understanding/return demonstration of topics presented as noted by education test scores.  Learning Barriers/Preferences:  Learning Barriers/Preferences - 12/26/19 0842      Learning Barriers/Preferences   Learning Barriers None    Learning Preferences None           General Cardiac Education Topics:  AED/CPR: - Group verbal and written instruction with the use of models to  demonstrate the basic use of the AED with the basic ABC's of resuscitation.   Anatomy and Cardiac Procedures: - Group verbal and visual presentation and models provide information about basic cardiac anatomy and function. Reviews the testing methods done to diagnose heart disease and the outcomes of the test results. Describes the treatment choices: Medical Management, Angioplasty, or Coronary Bypass Surgery for treating various heart conditions including Myocardial Infarction, Angina, Valve Disease, and Cardiac Arrhythmias.  Written material given at graduation.   Medication Safety: - Group verbal and visual instruction to review commonly prescribed medications for heart and lung disease. Reviews the medication, class of the drug, and side effects. Includes the steps to properly store meds and maintain the prescription regimen.  Written material given at graduation.   Intimacy: - Group verbal instruction through game format to discuss how heart and lung disease can  affect sexual intimacy. Written material given at graduation..   Know Your Numbers and Heart Failure: - Group verbal and visual instruction to discuss disease risk factors for cardiac and pulmonary disease and treatment options.  Reviews associated critical values for Overweight/Obesity, Hypertension, Cholesterol, and Diabetes.  Discusses basics of heart failure: signs/symptoms and treatments.  Introduces Heart Failure Zone chart for action plan for heart failure.  Written material given at graduation.   Infection Prevention: - Provides verbal and written material to individual with discussion of infection control including proper hand washing and proper equipment cleaning during exercise session.   Cardiac Rehab from 12/26/2019 in [facility] Cardiac and Pulmonary Rehab  Date 12/26/19  Educator [facility]  Instruction Review Code 1- Verbalizes Understanding      Falls Prevention: - Provides verbal and written material to individual with discussion of falls prevention and safety.   Cardiac Rehab from 12/26/2019 in [facility]  Date 12/26/19  Educator [facility]  Instruction Review Code 1- Verbalizes Understanding      Other: -Provides group and verbal instruction on various topics (see comments)   Knowledge Questionnaire Score:  Knowledge Questionnaire Score - 01/04/20 1352      Knowledge Questionnaire Score   Pre Score 19/26: A&P, Angina, PAD, Exercise, Tobacco           Core Components/Risk Factors/Patient Goals at Admission:  Personal Goals and Risk Factors at Admission - 01/04/20 1348      Core Components/Risk Factors/Patient Goals on Admission    Weight Management Yes;Weight Loss    Intervention Weight Management: Develop a combined nutrition and exercise program designed to reach desired caloric intake, while maintaining appropriate intake of nutrient and fiber, sodium and fats, and appropriate energy expenditure required for the weight goal.;Weight Management: Provide  education and appropriate resources to help participant work on and attain dietary goals.;Weight Management/Obesity: Establish reasonable short term and long term weight goals.    Admit Weight 153 lb 1.6 oz (69.4 kg)    Goal Weight: Short Term 150 lb (68 kg)    Goal Weight: Long Term 147 lb (66.7 kg)    Expected Outcomes Short Term: Continue to assess and modify interventions until short term weight is achieved;Long Term: Adherence to nutrition and physical activity/exercise program aimed toward attainment of established weight goal;Understanding recommendations for meals to include 15-35% energy as protein, 25-35% energy from fat, 35-60% energy from carbohydrates, less than 223m of dietary cholesterol, 20-35 gm of total fiber daily;Understanding of distribution of calorie intake throughout the day with the consumption of 4-5 meals/snacks;Weight Loss: Understanding of general recommendations for a balanced deficit meal plan, which  promotes 1-2 lb weight loss per week and includes a negative energy balance of [phone] kcal/d    Diabetes Yes    Intervention Provide education about signs/symptoms and action to take for hypo/hyperglycemia.;Provide education about proper nutrition, including hydration, and aerobic/resistive exercise prescription along with prescribed medications to achieve blood glucose in normal ranges: Fasting glucose 65-99 mg/dL    Expected Outcomes Short Term: Participant verbalizes understanding of the signs/symptoms and immediate care of hyper/hypoglycemia, proper foot care and importance of medication, aerobic/resistive exercise and nutrition plan for blood glucose control.;Long Term: Attainment of HbA1C < 7%.    Heart Failure Yes    Intervention Provide a combined exercise and nutrition program that is supplemented with education, support and counseling about heart failure. Directed toward relieving symptoms such as shortness of breath, decreased exercise tolerance, and extremity edema.     Expected Outcomes Improve functional capacity of life;Short term: Attendance in program 2-3 days a week with increased exercise capacity. Reported lower sodium intake. Reported increased fruit and vegetable intake. Reports medication compliance.;Short term: Daily weights obtained and reported for increase. Utilizing diuretic protocols set by physician.;Long term: Adoption of self-care skills and reduction of barriers for early signs and symptoms recognition and intervention leading to self-care maintenance.    Hypertension Yes    Intervention Provide education on lifestyle modifcations including regular physical activity/exercise, weight management, moderate sodium restriction and increased consumption of fresh fruit, vegetables, and low fat dairy, alcohol moderation, and smoking cessation.;Monitor prescription use compliance.    Expected Outcomes Short Term: Continued assessment and intervention until BP is < 140/73m HG in hypertensive participants. < 130/827mHG in hypertensive participants with diabetes, heart failure or chronic kidney disease.;Long Term: Maintenance of blood pressure at goal levels.    Lipids Yes    Intervention Provide education and support for participant on nutrition & aerobic/resistive exercise along with prescribed medications to achieve LDL <7056mHDL >73m17m  Expected Outcomes Short Term: Participant states understanding of desired cholesterol values and is compliant with medications prescribed. Participant is following exercise prescription and nutrition guidelines.;Long Term: Cholesterol controlled with medications as prescribed, with individualized exercise RX and with personalized nutrition plan. Value goals: LDL < 70mg73mL > 40 mg.    Stress Yes    Intervention Offer individual and/or small group education and counseling on adjustment to heart disease, stress management and health-related lifestyle change. Teach and support self-help strategies.;Refer participants  experiencing significant psychosocial distress to appropriate mental health specialists for further evaluation and treatment. When possible, include family members and significant others in education/counseling sessions.    Expected Outcomes Short Term: Participant demonstrates changes in health-related behavior, relaxation and other stress management skills, ability to obtain effective social support, and compliance with psychotropic medications if prescribed.;Long Term: Emotional wellbeing is indicated by absence of clinically significant psychosocial distress or social isolation.           Education:Diabetes - Individual verbal and written instruction to review signs/symptoms of diabetes, desired ranges of glucose level fasting, after meals and with exercise. Acknowledge that pre and post exercise glucose checks will be done for 3 sessions at entry of program.   [facility]  Date 12/26/19  [facility]      Core Components/Risk Factors/Patient Goals Review:    Core Components/Risk Factors/Patient Goals at Discharge (Final Review):    ITP Comments:  ITP Comments    Row Name 12/26/19 [id number]  01/04/20 1201         ITP Comments Virtual Visit completed. Patient informed on EP and RD appointment and 6 Minute walk test. Patient also informed of patient health questionnaires on My Chart. Patient Verbalizes understanding. Visit diagnosis can be found in [facility] 12/13/2019. Completed 6MWT and gym orientation. Initial ITP created and sent for review to Dr. Emily Filbert, Medical Director.             Comments: Initial ITP

## 2020-01-04 NOTE — Patient Instructions (Addendum)
Patient Instructions  Patient Details  Name: [name] MRN: [id number] Date of Birth: Jan 22, 1971 Referring Provider:  Wellington Hampshire, MD  Below are your personal goals for exercise, nutrition, and risk factors. Our goal is to help you stay on track towards obtaining and maintaining these goals. We will be discussing your progress on these goals with you throughout the program.  Initial Exercise Prescription:  Initial Exercise Prescription - 01/04/20 1300      Date of Initial Exercise RX and Referring Provider   Date 01/04/20    Referring Provider Kathlyn Sacramento MD      Treadmill   MPH 1.3    Grade 0    Minutes 15    METs 2      NuStep   Level 1    SPM 80    Minutes 15    METs 2.7      REL-XR   Level 1    Speed 50    Minutes 15    METs 2.7      Biostep-RELP   Level 1    SPM 50    Minutes 15    METs 2.7      Prescription Details   Frequency (times per week) 2    Duration Progress to 30 minutes of continuous aerobic without signs/symptoms of physical distress      Intensity   THRR 40-80% of Max Heartrate 106-148    Ratings of Perceived Exertion 11-13    Perceived Dyspnea 0-4      Progression   Progression Continue to progress workloads to maintain intensity without signs/symptoms of physical distress.      Resistance Training   Training Prescription Yes    Weight 3 lb    Reps 10-15           Exercise Goals: Frequency: Be able to perform aerobic exercise two to three times per week in program working toward 2-5 days per week of home exercise.  Intensity: Work with a perceived exertion of 11 (fairly light) - 15 (hard) while following your exercise prescription.  We will make changes to your prescription with you as you progress through the program.   Duration: Be able to do 30 to 45 minutes of continuous aerobic exercise in addition to a 5 minute warm-up and a 5 minute cool-down routine.   Nutrition Goals: Your personal nutrition goals  will be established when you do your nutrition analysis with the dietician.  The following are general nutrition guidelines to follow: Cholesterol < 200mg /day Sodium < 1500mg /day Fiber: Women over 50 yrs - 21 grams per day  Personal Goals:  Personal Goals and Risk Factors at Admission - 01/04/20 1348      Core Components/Risk Factors/Patient Goals on Admission    Weight Management Yes;Weight Loss    Intervention Weight Management: Develop a combined nutrition and exercise program designed to reach desired caloric intake, while maintaining appropriate intake of nutrient and fiber, sodium and fats, and appropriate energy expenditure required for the weight goal.;Weight Management: Provide education and appropriate resources to help participant work on and attain dietary goals.;Weight Management/Obesity: Establish reasonable short term and long term weight goals.    Admit Weight 153 lb 1.6 oz (69.4 kg)    Goal Weight: Short Term 150 lb (68 kg)    Goal Weight: Long Term 147 lb (66.7 kg)    Expected Outcomes Short Term: Continue to assess and modify interventions until short term weight is achieved;Long Term: Adherence to nutrition  and physical activity/exercise program aimed toward attainment of established weight goal;Understanding recommendations for meals to include 15-35% energy as protein, 25-35% energy from fat, 35-60% energy from carbohydrates, less than 200mg  of dietary cholesterol, 20-35 gm of total fiber daily;Understanding of distribution of calorie intake throughout the day with the consumption of 4-5 meals/snacks;Weight Loss: Understanding of general recommendations for a balanced deficit meal plan, which promotes 1-2 lb weight loss per week and includes a negative energy balance of [phone] kcal/d    Diabetes Yes    Intervention Provide education about signs/symptoms and action to take for hypo/hyperglycemia.;Provide education about proper nutrition, including hydration, and  aerobic/resistive exercise prescription along with prescribed medications to achieve blood glucose in normal ranges: Fasting glucose 65-99 mg/dL    Expected Outcomes Short Term: Participant verbalizes understanding of the signs/symptoms and immediate care of hyper/hypoglycemia, proper foot care and importance of medication, aerobic/resistive exercise and nutrition plan for blood glucose control.;Long Term: Attainment of HbA1C < 7%.    Heart Failure Yes    Intervention Provide a combined exercise and nutrition program that is supplemented with education, support and counseling about heart failure. Directed toward relieving symptoms such as shortness of breath, decreased exercise tolerance, and extremity edema.    Expected Outcomes Improve functional capacity of life;Short term: Attendance in program 2-3 days a week with increased exercise capacity. Reported lower sodium intake. Reported increased fruit and vegetable intake. Reports medication compliance.;Short term: Daily weights obtained and reported for increase. Utilizing diuretic protocols set by physician.;Long term: Adoption of self-care skills and reduction of barriers for early signs and symptoms recognition and intervention leading to self-care maintenance.    Hypertension Yes    Intervention Provide education on lifestyle modifcations including regular physical activity/exercise, weight management, moderate sodium restriction and increased consumption of fresh fruit, vegetables, and low fat dairy, alcohol moderation, and smoking cessation.;Monitor prescription use compliance.    Expected Outcomes Short Term: Continued assessment and intervention until BP is < 140/63mm HG in hypertensive participants. < 130/44mm HG in hypertensive participants with diabetes, heart failure or chronic kidney disease.;Long Term: Maintenance of blood pressure at goal levels.    Lipids Yes    Intervention Provide education and support for participant on nutrition &  aerobic/resistive exercise along with prescribed medications to achieve LDL 70mg , HDL >40mg .    Expected Outcomes Short Term: Participant states understanding of desired cholesterol values and is compliant with medications prescribed. Participant is following exercise prescription and nutrition guidelines.;Long Term: Cholesterol controlled with medications as prescribed, with individualized exercise RX and with personalized nutrition plan. Value goals: LDL < 70mg , HDL > 40 mg.    Stress Yes    Intervention Offer individual and/or small group education and counseling on adjustment to heart disease, stress management and health-related lifestyle change. Teach and support self-help strategies.;Refer participants experiencing significant psychosocial distress to appropriate mental health specialists for further evaluation and treatment. When possible, include family members and significant others in education/counseling sessions.    Expected Outcomes Short Term: Participant demonstrates changes in health-related behavior, relaxation and other stress management skills, ability to obtain effective social support, and compliance with psychotropic medications if prescribed.;Long Term: Emotional wellbeing is indicated by absence of clinically significant psychosocial distress or social isolation.           Tobacco Use Initial Evaluation: Social History   Tobacco Use  Smoking Status Former Smoker  . Packs/day: 0.25  . Years: 1.00  . Pack years: 0.25  . Types: Cigarettes  . Quit date:  08/31/1994  . Years since quitting: 25.3  Smokeless Tobacco Never Used  Tobacco Comment   quit 28 years ago    Exercise Goals and Review:  Exercise Goals    Row Name 01/04/20 1345             Exercise Goals   Increase Physical Activity Yes       Intervention Provide advice, education, support and counseling about physical activity/exercise needs.;Develop an individualized exercise prescription for aerobic and  resistive training based on initial evaluation findings, risk stratification, comorbidities and participant's personal goals.       Expected Outcomes Short Term: Attend rehab on a regular basis to increase amount of physical activity.;Long Term: Add in home exercise to make exercise part of routine and to increase amount of physical activity.;Long Term: Exercising regularly at least 3-5 days a week.       Increase Strength and Stamina Yes       Intervention Provide advice, education, support and counseling about physical activity/exercise needs.;Develop an individualized exercise prescription for aerobic and resistive training based on initial evaluation findings, risk stratification, comorbidities and participant's personal goals.       Expected Outcomes Short Term: Increase workloads from initial exercise prescription for resistance, speed, and METs.;Short Term: Perform resistance training exercises routinely during rehab and add in resistance training at home;Long Term: Improve cardiorespiratory fitness, muscular endurance and strength as measured by increased METs and functional capacity (6MWT)       Able to understand and use rate of perceived exertion (RPE) scale Yes       Intervention Provide education and explanation on how to use RPE scale       Expected Outcomes Short Term: Able to use RPE daily in rehab to express subjective intensity level;Long Term:  Able to use RPE to guide intensity level when exercising independently       Able to understand and use Dyspnea scale Yes       Intervention Provide education and explanation on how to use Dyspnea scale       Expected Outcomes Short Term: Able to use Dyspnea scale daily in rehab to express subjective sense of shortness of breath during exertion;Long Term: Able to use Dyspnea scale to guide intensity level when exercising independently       Knowledge and understanding of Target Heart Rate Range (THRR) Yes       Intervention Provide education and  explanation of THRR including how the numbers were predicted and where they are located for reference       Expected Outcomes Short Term: Able to state/look up THRR;Short Term: Able to use daily as guideline for intensity in rehab;Long Term: Able to use THRR to govern intensity when exercising independently       Able to check pulse independently Yes       Intervention Provide education and demonstration on how to check pulse in carotid and radial arteries.;Review the importance of being able to check your own pulse for safety during independent exercise       Expected Outcomes Short Term: Able to explain why pulse checking is important during independent exercise;Long Term: Able to check pulse independently and accurately       Understanding of Exercise Prescription Yes       Intervention Provide education, explanation, and written materials on patient's individual exercise prescription       Expected Outcomes Short Term: Able to explain program exercise prescription;Long Term: Able to explain home exercise prescription to  exercise independently       Improve claudication pain toleration; Improve walking ability Yes              Copy of goals given to participant.

## 2020-01-05 ENCOUNTER — Ambulatory Visit (INDEPENDENT_AMBULATORY_CARE_PROVIDER_SITE_OTHER): Payer: Medicare Other

## 2020-01-05 ENCOUNTER — Encounter (INDEPENDENT_AMBULATORY_CARE_PROVIDER_SITE_OTHER): Payer: Self-pay | Admitting: Vascular Surgery

## 2020-01-05 ENCOUNTER — Ambulatory Visit (INDEPENDENT_AMBULATORY_CARE_PROVIDER_SITE_OTHER): Payer: Medicare Other | Admitting: Vascular Surgery

## 2020-01-05 VITALS — BP 113/73 | HR 71 | Resp 16 | Ht 63.0 in | Wt 151.4 lb

## 2020-01-05 DIAGNOSIS — N1831 Chronic kidney disease, stage 3a: Secondary | ICD-10-CM | POA: Diagnosis not present

## 2020-01-05 DIAGNOSIS — I200 Unstable angina: Secondary | ICD-10-CM | POA: Diagnosis not present

## 2020-01-05 DIAGNOSIS — E785 Hyperlipidemia, unspecified: Secondary | ICD-10-CM

## 2020-01-05 DIAGNOSIS — I739 Peripheral vascular disease, unspecified: Secondary | ICD-10-CM

## 2020-01-05 DIAGNOSIS — M79605 Pain in left leg: Secondary | ICD-10-CM

## 2020-01-05 DIAGNOSIS — E1122 Type 2 diabetes mellitus with diabetic chronic kidney disease: Secondary | ICD-10-CM | POA: Diagnosis not present

## 2020-01-05 DIAGNOSIS — M79604 Pain in right leg: Secondary | ICD-10-CM | POA: Diagnosis not present

## 2020-01-05 DIAGNOSIS — M79609 Pain in unspecified limb: Secondary | ICD-10-CM | POA: Insufficient documentation

## 2020-01-05 NOTE — Progress Notes (Signed)
Patient ID: [name], female   DOB: 21-Jun-1970, 49 y.o.   MRN: [id number]  Chief Complaint  Patient presents with  . New Patient (Initial Visit)    ref Luana Shu PVD    HPI [name] [name] is a 49 y.o. female.  I am asked to see the patient by Dr. Luana Shu for evaluation of leg pain and possible PAD.  The patient has a long history of cardiac disease and has undergone multiple cardiac interventions in the past.  She has pain in her legs and feet all the time.  She is on gabapentin but that has not really helped.  She does not have sores or pain that wakes her from sleep.  She denies any clear cause or inciting event.  Her pain in her legs has been gradually progressing over time.  ABIs today were done which were noncompressible from medial calcification, but her waveforms were triphasic in her digital pressures and waveforms were normal.  This would indicate she does have some peripheral arterial disease, but her flow is currently preserved and normal and this would not be the cause of her current pain.     Past Medical History:  Diagnosis Date  . Acute colitis 01/27/2017  . Acute pyelonephritis   . Anemia    iron deficiency anemia  . Aortic arch aneurysm ([place])   . BRCA negative 2014  . CAD (coronary artery disease)    a. 08/2003 Cath: LAD 30-40-med Rx; b. 11/2014 PCI: LAD 70m(3.25x23 Xience Alpine DES); c. 06/2015 PCI: D1 (2.25x12 Resolute Integrity DES); d. 06/2017 PCI: Patent mLAD stent, D2 95 (PTCA); e. 09/2017 PCI: D2 99ost (CBA); d. 12/2017 Cath: LM nl, LAD 339m80d (small), D1 40ost, D2 95ost, LCX 40p, RCA 40ost/p->Med rx for D2 given restenosis.  . Colitis 06/03/2015  . Colon polyp   . CVA (cerebral vascular accident) ([facility]   Left side weakness.   . Degenerative tear of glenoid labrum of right shoulder 03/15/2017  . Diabetes mellitus without complication ([facility]  . Family history of breast cancer    BRCA neg 2014  . Femur fracture, left (HCAtalissa9/10/2018  . Gastric ulcer  04/27/2011  . History of echocardiogram    a. 03/2017 Echo: EF 60-65%, no rwma; b. 02/2018 Echo: EF 60-65%, no rwma. Nl RV fxn. No cardiac source of emboli (admitted w/ stroke).  . Hypertension   . Malignant melanoma of skin of scalp (HCS[facility] Luis  . MI, acute, non ST segment elevation ([facility]  . Neuromuscular disorder ([facility]  . S/P drug eluting coronary stent placement 06/04/2015  . Sepsis (HC[facility]ath[facility]2/14/2019  . [facility]   a. 02/2018 MRI: 51m62mate acute/early subacute L medial frontal lobe inarct; b. 02/2018 MRA No large vessel occlusion or aneurysm. Mod to sev L P2 stenosis. thready L vertebral artery, diffusely dzs'd; c. 02/2018 Carotid U/S: <50% bilat ICA dzs.    Past Surgical History:  Procedure Laterality Date  . APPENDECTOMY    . CARDIAC CATHETERIZATION N/A 11/09/2014   Procedure: Coronary Angiography;  Surgeon: TimMinna MerrittsD;  [facility];  Service: Cardiovascular;  Laterality: N/A;  . CARDIAC CATHETERIZATION N/A 11/12/2014   Procedure: Coronary Stent Intervention;  Surgeon: AleIsaias CowmanD;  Location: [facility];  Service: Cardiovascular;  Laterality: N/A;  . CARDIAC CATHETERIZATION N/A 04/18/2015   Procedure: Left Heart Cath and Coronary Angiography;  Surgeon: TimMinna MerrittsD;  [facility];  Service: Cardiovascular;  Laterality: N/A;  . CARDIAC CATHETERIZATION Left 06/04/2015   Procedure: Left Heart Cath and Coronary Angiography;  Surgeon: Wellington Hampshire, MD;  Location: [place] CV LAB;  Service: Cardiovascular;  Laterality: Left;  . CARDIAC CATHETERIZATION N/A 06/04/2015   Procedure: Coronary Stent Intervention;  Surgeon: Wellington Hampshire, MD;  Location: [place] CV LAB;  Service: Cardiovascular;  Laterality: N/A;  . CESAREAN SECTION  2001  . CHOLECYSTECTOMY N/A 11/18/2016   Procedure: LAPAROSCOPIC CHOLECYSTECTOMY WITH INTRAOPERATIVE CHOLANGIOGRAM;  Surgeon: Christene Lye, MD;  [facility];  Service:  General;  Laterality: N/A;  . COLONOSCOPY WITH PROPOFOL N/A 04/27/2016   Procedure: COLONOSCOPY WITH PROPOFOL;  Surgeon: [name], MD;  Location: [place];  Service: Endoscopy;  Laterality: N/A;  . COLONOSCOPY WITH PROPOFOL N/A 01/12/2018   Procedure: COLONOSCOPY WITH PROPOFOL;  Surgeon: Toledo, Benay Pike, MD;  Location: [facility]OS[facility];  Service: Endoscopy;  Laterality: N/A;  . CORONARY ANGIOPLASTY    . CORONARY BALLOON ANGIOPLASTY N/A 06/29/2017   Procedure: CORONARY BALLOON ANGIOPLASTY;  Surgeon: Wellington Hampshire, MD;  Location: [place] CV LAB;  Service: Cardiovascular;  Laterality: N/A;  . CORONARY BALLOON ANGIOPLASTY N/A 09/20/2017   Procedure: CORONARY BALLOON ANGIOPLASTY;  Surgeon: Wellington Hampshire, MD;  Location: [place] CV LAB;  Service: Cardiovascular;  Laterality: N/A;  . CORONARY STENT INTERVENTION N/A 12/13/2019   Procedure: CORONARY STENT INTERVENTION;  Surgeon: Wellington Hampshire, MD;  Location: [place] CV LAB;  Service: Cardiovascular;  Laterality: N/A;  . DILATION AND CURETTAGE OF UTERUS    . ESOPHAGOGASTRODUODENOSCOPY (EGD) WITH PROPOFOL N/A 09/14/2014   Procedure: ESOPHAGOGASTRODUODENOSCOPY (EGD) WITH PROPOFOL;  Surgeon: Josefine Class, MD;  Location: [facility] ENDOSCOPY;  Service: Endoscopy;  Laterality: N/A;  . ESOPHAGOGASTRODUODENOSCOPY (EGD) WITH PROPOFOL N/A 04/27/2016   Procedure: ESOPHAGOGASTRODUODENOSCOPY (EGD) WITH PROPOFOL;  Surgeon: [name], MD;  Location: [place];  Service: Endoscopy;  Laterality: N/A;  Diabetic - oral meds  . ESOPHAGOGASTRODUODENOSCOPY (EGD) WITH PROPOFOL N/A 01/12/2018   Procedure: ESOPHAGOGASTRODUODENOSCOPY (EGD) WITH PROPOFOL;  Surgeon: Toledo, Benay Pike, MD;  Location: [facility];  Service: Endoscopy;  Laterality: N/A;  . ESOPHAGOGASTRODUODENOSCOPY (EGD) WITH PROPOFOL N/A 04/11/2019   Procedure: ESOPHAGOGASTRODUODENOSCOPY (EGD) WITH PROPOFOL;  Surgeon: [name], MD;  Location: [facility];   Service: Endoscopy;  Laterality: N/A;  . GASTRIC BYPASS  09/2009   [place] (IM) NAIL INTERTROCHANTERIC Left 10/13/2018   Procedure: INTRAMEDULLARY (IM) NAIL INTERTROCHANTRIC;  Surgeon: Leandrew Koyanagi, MD;  Location: [place];  Service: Orthopedics;  Laterality: Left;  . Left Carotid to sublcavian artery bypass w/ subclavian artery ligation     a. Performed @ [place].  . LEFT HEART CATH AND CORONARY ANGIOGRAPHY Left 06/29/2017   Procedure: LEFT HEART CATH AND CORONARY ANGIOGRAPHY;  Surgeon: Wellington Hampshire, MD;  Location: [place] CV LAB;  Service: Cardiovascular;  Laterality: Left;  . LEFT HEART CATH AND CORONARY ANGIOGRAPHY N/A 09/20/2017   Procedure: LEFT HEART CATH AND CORONARY ANGIOGRAPHY;  Surgeon: Wellington Hampshire, MD;  Location: [place] CV LAB;  Service: Cardiovascular;  Laterality: N/A;  . LEFT HEART CATH AND CORONARY ANGIOGRAPHY N/A 12/20/2017   Procedure: LEFT HEART CATH AND CORONARY ANGIOGRAPHY;  Surgeon: Wellington Hampshire, MD;  Location: [place] CV LAB;  Service: Cardiovascular;  Laterality: N/A;  . LEFT HEART CATH AND CORONARY ANGIOGRAPHY N/A 04/20/2019   Procedure: LEFT HEART CATH AND CORONARY ANGIOGRAPHY possible PCI;  Surgeon: Minna Merritts, MD;  Location: [place] CV LAB;  Service: Cardiovascular;  Laterality: N/A;  . LEFT HEART CATH AND CORONARY ANGIOGRAPHY N/A 12/13/2019   Procedure: LEFT HEART CATH AND CORONARY ANGIOGRAPHY;  Surgeon: Wellington Hampshire, MD;  Location: [place] CV LAB;  Service: Cardiovascular;  Laterality: N/A;  . MELANOMA EXCISION  2016   Dr. Evorn Gong  . [place]  2002  . RIGHT OOPHORECTOMY    . SHOULDER ARTHROSCOPY WITH OPEN ROTATOR CUFF REPAIR Right 01/07/2016   Procedure: SHOULDER ARTHROSCOPY WITH DEBRIDMENT, SUBACHROMIAL DECOMPRESSION;  Surgeon: Corky Mull, MD;  Location: [facility];  Service: Orthopedics;  Laterality: Right;  . SHOULDER ARTHROSCOPY WITH OPEN ROTATOR CUFF REPAIR Right  03/16/2017   Procedure: SHOULDER ARTHROSCOPY WITH OPEN ROTATOR CUFF REPAIR POSSIBLE BICEPS TENODESIS;  Surgeon: Corky Mull, MD;  Location: [facility];  Service: Orthopedics;  Laterality: Right;  . TRIGGER FINGER RELEASE Right     [place]     Family History  Problem Relation Age of Onset  . Hypertension Mother   . Anxiety disorder Mother   . Depression Mother   . Bipolar disorder Mother   . Heart disease Mother        No details  . Hyperlipidemia Mother   . Kidney disease Father   . Heart disease Father 68  . Hypertension Father   . Diabetes Father   . Stroke Father   . Colon cancer Father        dx in his 45's  . Anxiety disorder Father   . Depression Father   . Skin cancer Father   . Kidney disease Sister   . Thyroid nodules Sister   . Hypertension Sister   . Hypertension Sister   . Diabetes Sister   . Hyperlipidemia Sister   . Depression Sister   . Breast cancer Maternal Aunt 39  . Breast cancer Maternal Aunt 36  . Ovarian cancer Cousin   . Colon cancer Cousin   . Kidney cancer Cousin   . Breast cancer Other   . Bladder Cancer Neg Hx      Social History   Tobacco Use  . Smoking status: Former Smoker    Packs/day: 0.25    Years: 1.00    Pack years: 0.25    Types: Cigarettes    Quit date: 08/31/1994    Years since quitting: 25.3  . Smokeless tobacco: Never Used  . Tobacco comment: quit 28 years ago  Vaping Use  . Vaping Use: Never used  Substance Use Topics  . Alcohol use: No    Alcohol/week: 0.0 standard drinks  . Drug use: No     Allergies  Allergen Reactions  . Lipitor [Atorvastatin] Other (See Comments)    Leg pains  . Tramadol Other (See Comments)    Mouth feels like it's on fire    Current Outpatient Medications  Medication Sig Dispense Refill  . ALPRAZolam (XANAX) 1 MG tablet Take 1 tablet by mouth twice daily as needed (Patient taking differently: Take 1 mg by mouth 2 (two) times daily as needed for anxiety. ) 60 tablet 3  .  amLODipine (NORVASC) 2.5 MG tablet Take 1 tablet (2.5 mg total) by mouth daily. (Patient taking differently: Take 2.5 mg by mouth every evening. ) 90 tablet 3  . aspirin EC 81 MG tablet Take 1 tablet (81 mg total) by mouth daily. Swallow whole.    Marland Kitchen buPROPion (WELLBUTRIN SR) 200 MG 12 hr tablet Take 200 mg  by mouth every morning.    . carvedilol (COREG) 12.5 MG tablet Take 1 tablet (12.5 mg total) by mouth 2 (two) times daily with a meal. 60 tablet 3  . clopidogrel (PLAVIX) 75 MG tablet Take 1 tablet (75 mg total) by mouth daily. 90 tablet 3  . desvenlafaxine (PRISTIQ) 50 MG 24 hr tablet Take 50 mg by mouth daily.    Marland Kitchen gabapentin (NEURONTIN) 300 MG capsule Take 1 capsule (300 mg total) by mouth 2 (two) times daily. (Patient taking differently: Take 600 mg by mouth 2 (two) times daily. ) 180 capsule 4  . isosorbide mononitrate (IMDUR) 120 MG 24 hr tablet Take 1 tablet (120 mg total) by mouth daily. 30 tablet 3  . levothyroxine (SYNTHROID) 25 MCG tablet Take 25 mcg by mouth daily before breakfast.    . nitroGLYCERIN (NITROSTAT) 0.4 MG SL tablet Place 1 tablet (0.4 mg total) under the tongue every 5 (five) minutes as needed for chest pain. 25 tablet 2  . pantoprazole (PROTONIX) 40 MG tablet Take 1 tablet by mouth twice daily (Patient taking differently: Take 40 mg by mouth 2 (two) times daily. ) 180 tablet 1  . Polyethyl Glycol-Propyl Glycol (SYSTANE OP) Place 1 drop into both eyes daily as needed (dry eyes).    . promethazine (PHENERGAN) 25 MG tablet Take 25 mg by mouth every 6 (six) hours as needed for nausea or vomiting.     . rosuvastatin (CRESTOR) 40 MG tablet Take 1 tablet (40 mg total) by mouth daily at 6 PM. 90 tablet 3  . traZODone (DESYREL) 100 MG tablet TAKE 1 TO 2 TABLETS BY MOUTH AT BEDTIME (Patient taking differently: Take 100-200 mg by mouth at bedtime as needed for sleep. ) 30 tablet 5  . UBRELVY 100 MG TABS Take 100 mg by mouth 2 (two) times daily as needed (migraine).     . venlafaxine  (EFFEXOR) 75 MG tablet Take 150 mg by mouth daily.    . Biotin w/ Vitamins C & E (HAIR SKIN & NAILS GUMMIES PO) Take 4 tablets by mouth daily. (Patient not taking: Reported on 12/26/2019)    . EUTHYROX 25 MCG tablet TAKE 1 TABLET BY MOUTH ONCE DAILY BEFORE BREAKFAST (Patient not taking: Reported on 01/05/2020) 90 tablet 3  . FIBER COMPLETE PO Take 3 capsules by mouth daily.  (Patient not taking: Reported on 01/05/2020)    . Multiple Vitamin (MULTIVITAMIN) capsule Take 1 capsule by mouth daily. gummies (Patient not taking: Reported on 01/05/2020)     No current facility-administered medications for this visit.      REVIEW OF SYSTEMS (Negative unless checked)  Constitutional: []Weight loss  []Fever  []Chills Cardiac: [x]Chest pain   []Chest pressure   [x]Palpitations   []Shortness of breath when laying flat   []Shortness of breath at rest   [x]Shortness of breath with exertion. Vascular:  []Pain in legs with walking   []Pain in legs at rest   []Pain in legs when laying flat   []Claudication   [x]Pain in feet when walking  [x]Pain in feet at rest  [x]Pain in feet when laying flat   []History of DVT   []Phlebitis   []Swelling in legs   []Varicose veins   []Non-healing ulcers Pulmonary:   []Uses home oxygen   []Productive cough   []Hemoptysis   []Wheeze  []COPD   []Asthma Neurologic:  []Dizziness  []Blackouts   []Seizures   []History of stroke   []History of TIA  []Aphasia   []  Temporary blindness   []Dysphagia   []Weakness or numbness in arms   []Weakness or numbness in legs Musculoskeletal:  [x]Arthritis   []Joint swelling   []Joint pain   []Low back pain Hematologic:  []Easy bruising  []Easy bleeding   []Hypercoagulable state   []Anemic  []Hepatitis Gastrointestinal:  []Blood in stool   []Vomiting blood  []Gastroesophageal reflux/heartburn   []Abdominal pain Genitourinary:  [x]Chronic kidney disease   []Difficult urination  []Frequent urination  []Burning with urination   []Hematuria Skin:  []Rashes    []Ulcers   []Wounds Psychological:  [x]History of anxiety   [] History of major depression.   Physical Exam BP 113/73 (BP Location: Left Arm)   Pulse 71   Resp 16   Ht 5' 3" (1.6 m)   Wt 151 lb 6.4 oz (68.7 kg)   BMI 26.82 kg/m  Gen:  WD/WN, NAD. Appears older than stated age Head: Stamps/AT, No temporalis wasting.  Ear/Nose/Throat: Hearing grossly intact, nares w/o erythema or drainage, oropharynx w/o Erythema/Exudate Eyes: Conjunctiva clear, sclera non-icteric  Neck: trachea midline.  No JVD.  Pulmonary:  Good air movement, respirations not labored, no use of accessory muscles  Cardiac: RRR, no JVD Vascular:  Vessel Right Left  Radial Palpable Palpable                          DP 2+ 2+  PT 2+ 2+   Gastrointestinal:. No masses, surgical incisions, or scars. Musculoskeletal: M/S 5/5 throughout.  Extremities without ischemic changes.  No deformity or atrophy. No edema. Neurologic: Sensation grossly intact in extremities.  Symmetrical.  Speech is fluent. Motor exam as listed above. Psychiatric: Judgment intact, Mood & affect appropriate for pt's clinical situation. Dermatologic: No rashes or ulcers noted.  No cellulitis or open wounds.    Radiology DG Chest 2 View  Result Date: 12/12/2019 CLINICAL DATA:  Chest pain. EXAM: CHEST - 2 VIEW COMPARISON:  11/18/2019. FINDINGS: Similar cardiac silhouette. Right-sided aortic arch with vascular ring, better characterized on prior CT chest. Surgical clips project in the region of the left thoracic inlet. Both lungs are clear. No visible pleural effusions or pneumothorax. No acute osseous abnormality. Cholecystectomy clips. IMPRESSION: No active cardiopulmonary disease. Electronically Signed   By: Margaretha Sheffield MD   On: 12/12/2019 12:24   CARDIAC CATHETERIZATION  Result Date: 12/13/2019  Non-stenotic 1st Diag lesion was previously treated.  Ost 1st Diag lesion is 40% stenosed.  Mid LAD-2 lesion is 40% stenosed.  Ost 2nd Diag to  2nd Diag lesion is 90% stenosed.  Mid LAD-1 lesion is 55% stenosed.  Prox Cx lesion is 40% stenosed.  Mid RCA lesion is 40% stenosed.  Mid LAD to Dist LAD lesion is 90% stenosed.  Post intervention, there is a 0% residual stenosis.  A drug-eluting stent was successfully placed using a STENT RESOLUTE ONYX [id number].  Dist LAD lesion is 70% stenosed.  1.  Patent stents in the mid LAD and first diagonal with mild to moderate in-stent restenosis, stable moderate mid RCA stenosis, stable diffuse disease in the superior branch of the first diagonal, moderate mid LAD stenosis at the bifurcation of second diagonal which has no significant ostial disease.  There is severe progression of stenosis in the mid to distal LAD.  In addition, there is moderately severe disease in the apical LAD. 2.  Normal left ventricular end-diastolic pressure. 3.  Successful angioplasty and drug-eluting stent placement to the mid to distal LAD. Recommendations: The  patient has diffuse small vessel disease.  I elected to treat the most severe disease today and reevaluate her symptoms and response as an outpatient.  Given underlying advanced chronic kidney disease, I elected not to proceed with mid LAD/second diagonal bifurcation angioplasty especially that the disease in that area is not critical and will require bifurcation stenting.  Hydrate overnight and possible discharge home tomorrow. Continue dual antiplatelet therapy indefinitely. No need to resume Eliquis.  I do not think the patient had pulmonary embolism as the area on VQ scan was very questionable and small overall with subsequent CTA of the pulmonary arteries showing no evidence of thrombus.    Labs Recent Results (from the past 2160 hour(s))  Glucose, capillary     Status: Abnormal   Collection Time: 10/13/19 12:57 PM  Result Value Ref Range   Glucose-Capillary 156 (H) 70 - 99 mg/dL    Comment: Glucose reference range applies only to samples taken after fasting for at  least 8 hours.  CBC with Differential     Status: None   Collection Time: 10/13/19  1:00 PM  Result Value Ref Range   WBC 9.2 4.0 - 10.5 K/uL   RBC 4.69 3.87 - 5.11 MIL/uL   Hemoglobin 13.0 12.0 - 15.0 g/dL   HCT 38.7 36 - 46 %   MCV 82.5 80.0 - 100.0 fL   MCH 27.7 26.0 - 34.0 pg   MCHC 33.6 30.0 - 36.0 g/dL   RDW 14.0 11.5 - 15.5 %   Platelets 202 150 - 400 K/uL   nRBC 0.0 0.0 - 0.2 %   Neutrophils Relative % 78 %   Neutro Abs 7.2 1.7 - 7.7 K/uL   Lymphocytes Relative 15 %   Lymphs Abs 1.4 0.7 - 4.0 K/uL   Monocytes Relative 4 %   Monocytes Absolute 0.4 0.1 - 1.0 K/uL   Eosinophils Relative 2 %   Eosinophils Absolute 0.2 0.0 - 0.5 K/uL   Basophils Relative 1 %   Basophils Absolute 0.1 0.0 - 0.1 K/uL   Immature Granulocytes 0 %   Abs Immature Granulocytes 0.03 0.00 - 0.07 K/uL    Comment: Performed at [facility], [address] [place], [place] [address]  Comprehensive metabolic panel     Status: Abnormal   Collection Time: 10/13/19  1:00 PM  Result Value Ref Range   Sodium 138 135 - 145 mmol/L   Potassium 4.8 3.5 - 5.1 mmol/L   Chloride 106 98 - 111 mmol/L   CO2 23 22 - 32 mmol/L   Glucose, Bld 153 (H) 70 - 99 mg/dL    Comment: Glucose reference range applies only to samples taken after fasting for at least 8 hours.   BUN 22 (H) 6 - 20 mg/dL   Creatinine, Ser 1.76 (H) 0.44 - 1.00 mg/dL   Calcium 8.8 (L) 8.9 - 10.3 mg/dL   Total Protein 7.4 6.5 - 8.1 g/dL   Albumin 3.8 3.5 - 5.0 g/dL   AST 28 15 - 41 U/L   ALT 28 0 - 44 U/L   Alkaline Phosphatase 93 38 - 126 U/L   Total Bilirubin 0.7 0.3 - 1.2 mg/dL   GFR calc non Af Amer 33 (L) >60 mL/min   GFR calc Af Amer 38 (L) >60 mL/min   Anion gap 9 5 - 15    Comment: Performed at [facility], [address] [place], [place] [address]  Glucose, capillary     Status: Abnormal  Collection Time: 10/13/19  5:36 PM  Result Value Ref Range   Glucose-Capillary 50 (L) 70 - 99 mg/dL    Comment: Glucose  reference range applies only to samples taken after fasting for at least 8 hours.  Glucose, capillary     Status: Abnormal   Collection Time: 10/13/19  6:31 PM  Result Value Ref Range   Glucose-Capillary 59 (L) 70 - 99 mg/dL    Comment: Glucose reference range applies only to samples taken after fasting for at least 8 hours.  Insulin and C-Peptide     Status: Abnormal   Collection Time: 10/13/19  7:19 PM  Result Value Ref Range   Insulin 114.0 (H) 2.6 - 24.9 uIU/mL   C-Peptide 18.7 (H) 1.1 - 4.4 ng/mL    Comment: (NOTE) C-Peptide reference interval is for fasting patients. Performed At: [facility] [place], [place] [id number] Rush Farmer MD JI:[id number]   Urinalysis, Complete w Microscopic     Status: Abnormal   Collection Time: 10/13/19  7:31 PM  Result Value Ref Range   Color, Urine YELLOW (A) YELLOW   APPearance CLOUDY (A) CLEAR   Specific Gravity, Urine 1.008 1.005 - 1.030   pH 6.0 5.0 - 8.0   Glucose, UA NEGATIVE NEGATIVE mg/dL   Hgb urine dipstick NEGATIVE NEGATIVE   Bilirubin Urine NEGATIVE NEGATIVE   Ketones, ur NEGATIVE NEGATIVE mg/dL   Protein, ur NEGATIVE NEGATIVE mg/dL   Nitrite NEGATIVE NEGATIVE   Leukocytes,Ua LARGE (A) NEGATIVE   RBC / HPF 6-10 0 - 5 RBC/hpf   WBC, UA >50 (H) 0 - 5 WBC/hpf   Bacteria, UA MANY (A) NONE SEEN   Squamous Epithelial / LPF 6-10 0 - 5   WBC Clumps PRESENT    Mucus PRESENT     Comment: Performed at [facility], [address] [place], [place] [address]  SARS Coronavirus 2 by RT PCR (hospital order, performed in [place] hospital lab) Nasopharyngeal Nasopharyngeal Swab     Status: None   Collection Time: 10/13/19  7:34 PM   Specimen: Nasopharyngeal Swab  Result Value Ref Range   SARS Coronavirus 2 NEGATIVE NEGATIVE    Comment: (NOTE) SARS-CoV-2 target nucleic acids are NOT DETECTED.  The SARS-CoV-2 RNA is generally detectable in upper and lower respiratory specimens during the acute phase  of infection. The lowest concentration of SARS-CoV-2 viral copies this assay can detect is 250 copies / mL. A negative result does not preclude SARS-CoV-2 infection and should not be used as the sole basis for treatment or other patient management decisions.  A negative result may occur with improper specimen collection / handling, submission of specimen other than nasopharyngeal swab, presence of viral mutation(s) within the areas targeted by this assay, and inadequate number of viral copies (<250 copies / mL). A negative result must be combined with clinical observations, patient history, and epidemiological information.  Fact Sheet for Patients:   StrictlyIdeas.no  Fact Sheet for Healthcare Providers: BankingDealers.co.za  This test is not yet approved or  cleared by the [place] FDA and has been authorized for detection and/or diagnosis of SARS-CoV-2 by FDA under an Emergency Use Authorization (EUA).  This EUA will remain in effect (meaning this test can be used) for the duration of the COVID-19 declaration under Section 564(b)(1) of the Act, 21 U.S.C. section 360bbb-3(b)(1), unless the authorization is terminated or revoked sooner.  Performed at [facility], [address] [place], [place] [address]   Glucose, capillary  Status: Abnormal   Collection Time: 10/13/19  8:07 PM  Result Value Ref Range   Glucose-Capillary 157 (H) 70 - 99 mg/dL    Comment: Glucose reference range applies only to samples taken after fasting for at least 8 hours.   Comment 1 Document in Chart    Comment 2 Call MD NNP PA CNM   Glucose, capillary     Status: Abnormal   Collection Time: 10/13/19  8:56 PM  Result Value Ref Range   Glucose-Capillary 68 (L) 70 - 99 mg/dL    Comment: Glucose reference range applies only to samples taken after fasting for at least 8 hours.   Comment 1 Document in Chart    Comment 2 Call MD NNP PA CNM    Glucose, capillary     Status: None   Collection Time: 10/13/19  9:12 PM  Result Value Ref Range   Glucose-Capillary 97 70 - 99 mg/dL    Comment: Glucose reference range applies only to samples taken after fasting for at least 8 hours.  Glucose, capillary     Status: Abnormal   Collection Time: 10/13/19 10:01 PM  Result Value Ref Range   Glucose-Capillary 102 (H) 70 - 99 mg/dL    Comment: Glucose reference range applies only to samples taken after fasting for at least 8 hours.   Comment 1 Document in Chart    Comment 2 Call MD NNP PA CNM   Glucose, capillary     Status: None   Collection Time: 10/13/19 11:11 PM  Result Value Ref Range   Glucose-Capillary 86 70 - 99 mg/dL    Comment: Glucose reference range applies only to samples taken after fasting for at least 8 hours.  Troponin I (High Sensitivity)     Status: None   Collection Time: 10/13/19 11:52 PM  Result Value Ref Range   Troponin I (High Sensitivity) 8 <18 ng/L    Comment: (NOTE) Elevated high sensitivity troponin I (hsTnI) values and significant  changes across serial measurements may suggest ACS but many other  chronic and acute conditions are known to elevate hsTnI results.  Refer to the "Links" section for chest pain algorithms and additional  guidance. Performed at [facility], [address] [place], [place] [address]   Glucose, capillary     Status: None   Collection Time: 10/13/19 11:56 PM  Result Value Ref Range   Glucose-Capillary 76 70 - 99 mg/dL    Comment: Glucose reference range applies only to samples taken after fasting for at least 8 hours.  Glucose, capillary     Status: Abnormal   Collection Time: 10/14/19  1:07 AM  Result Value Ref Range   Glucose-Capillary 65 (L) 70 - 99 mg/dL    Comment: Glucose reference range applies only to samples taken after fasting for at least 8 hours.  MRSA PCR Screening     Status: Abnormal   Collection Time: 10/14/19  1:08 AM   Specimen: Nasopharyngeal   Result Value Ref Range   MRSA by PCR POSITIVE (A) NEGATIVE    Comment:        The GeneXpert MRSA Assay (FDA approved for NASAL specimens only), is one component of a comprehensive MRSA colonization surveillance program. It is not intended to diagnose MRSA infection nor to guide or monitor treatment for MRSA infections. RESULT CALLED TO, READ BACK BY AND VERIFIED WITH: [facility] CAMPBELL RN [phone] 10/14/19 HNM Performed at [facility], [address] [place], [place] [address]  Glucose, capillary     Status: None   Collection Time: 10/14/19  1:23 AM  Result Value Ref Range   Glucose-Capillary 92 70 - 99 mg/dL    Comment: Glucose reference range applies only to samples taken after fasting for at least 8 hours.  Comprehensive metabolic panel     Status: Abnormal   Collection Time: 10/14/19  1:37 AM  Result Value Ref Range   Sodium 133 (L) 135 - 145 mmol/L   Potassium 4.1 3.5 - 5.1 mmol/L   Chloride 100 98 - 111 mmol/L   CO2 23 22 - 32 mmol/L   Glucose, Bld 123 (H) 70 - 99 mg/dL    Comment: Glucose reference range applies only to samples taken after fasting for at least 8 hours.   BUN 19 6 - 20 mg/dL   Creatinine, Ser 1.73 (H) 0.44 - 1.00 mg/dL   Calcium 8.7 (L) 8.9 - 10.3 mg/dL   Total Protein 6.5 6.5 - 8.1 g/dL   Albumin 3.3 (L) 3.5 - 5.0 g/dL   AST 23 15 - 41 U/L   ALT 24 0 - 44 U/L   Alkaline Phosphatase 78 38 - 126 U/L   Total Bilirubin 0.6 0.3 - 1.2 mg/dL   GFR calc non Af Amer 34 (L) >60 mL/min   GFR calc Af Amer 39 (L) >60 mL/min   Anion gap 10 5 - 15    Comment: Performed at [facility], [address] [place], [place] [address]  CBC     Status: Abnormal   Collection Time: 10/14/19  1:37 AM  Result Value Ref Range   WBC 9.9 4.0 - 10.5 K/uL   RBC 4.09 3.87 - 5.11 MIL/uL   Hemoglobin 11.4 (L) 12.0 - 15.0 g/dL   HCT 33.6 (L) 36 - 46 %   MCV 82.2 80.0 - 100.0 fL   MCH 27.9 26.0 - 34.0 pg   MCHC 33.9 30.0 - 36.0 g/dL   RDW 13.9 11.5 - 15.5 %    Platelets 157 150 - 400 K/uL   nRBC 0.0 0.0 - 0.2 %    Comment: Performed at [facility], [address], [place], [place] [address]  Troponin I (High Sensitivity)     Status: None   Collection Time: 10/14/19  1:37 AM  Result Value Ref Range   Troponin I (High Sensitivity) 7 <18 ng/L    Comment: (NOTE) Elevated high sensitivity troponin I (hsTnI) values and significant  changes across serial measurements may suggest ACS but many other  chronic and acute conditions are known to elevate hsTnI results.  Refer to the "Links" section for chest pain algorithms and additional  guidance. Performed at [facility], [address] [place], [place] [address]   Glucose, capillary     Status: Abnormal   Collection Time: 10/14/19  2:05 AM  Result Value Ref Range   Glucose-Capillary 146 (H) 70 - 99 mg/dL    Comment: Glucose reference range applies only to samples taken after fasting for at least 8 hours.  Glucose, capillary     Status: Abnormal   Collection Time: 10/14/19  3:04 AM  Result Value Ref Range   Glucose-Capillary 277 (H) 70 - 99 mg/dL    Comment: Glucose reference range applies only to samples taken after fasting for at least 8 hours.  Glucose, capillary     Status: Abnormal   Collection Time: 10/14/19  4:03 AM  Result Value Ref Range   Glucose-Capillary 178 (H)  70 - 99 mg/dL    Comment: Glucose reference range applies only to samples taken after fasting for at least 8 hours.  Glucose, capillary     Status: Abnormal   Collection Time: 10/14/19  5:00 AM  Result Value Ref Range   Glucose-Capillary 142 (H) 70 - 99 mg/dL    Comment: Glucose reference range applies only to samples taken after fasting for at least 8 hours.  Glucose, capillary     Status: Abnormal   Collection Time: 10/14/19  6:04 AM  Result Value Ref Range   Glucose-Capillary 110 (H) 70 - 99 mg/dL    Comment: Glucose reference range applies only to samples taken after fasting for at least 8  hours.  Basic metabolic panel     Status: Abnormal   Collection Time: 10/14/19  6:18 AM  Result Value Ref Range   Sodium 133 (L) 135 - 145 mmol/L   Potassium 4.1 3.5 - 5.1 mmol/L   Chloride 102 98 - 111 mmol/L   CO2 24 22 - 32 mmol/L   Glucose, Bld 103 (H) 70 - 99 mg/dL    Comment: Glucose reference range applies only to samples taken after fasting for at least 8 hours.   BUN 19 6 - 20 mg/dL   Creatinine, Ser 1.56 (H) 0.44 - 1.00 mg/dL   Calcium 8.4 (L) 8.9 - 10.3 mg/dL   GFR calc non Af Amer 38 (L) >60 mL/min   GFR calc Af Amer 44 (L) >60 mL/min   Anion gap 7 5 - 15    Comment: Performed at [facility], [address] [place], [place] [address]  Glucose, capillary     Status: None   Collection Time: 10/14/19  7:09 AM  Result Value Ref Range   Glucose-Capillary 79 70 - 99 mg/dL    Comment: Glucose reference range applies only to samples taken after fasting for at least 8 hours.  Glucose, capillary     Status: Abnormal   Collection Time: 10/14/19  8:13 AM  Result Value Ref Range   Glucose-Capillary 142 (H) 70 - 99 mg/dL    Comment: Glucose reference range applies only to samples taken after fasting for at least 8 hours.  Glucose, capillary     Status: Abnormal   Collection Time: 10/14/19  9:03 AM  Result Value Ref Range   Glucose-Capillary 172 (H) 70 - 99 mg/dL    Comment: Glucose reference range applies only to samples taken after fasting for at least 8 hours.  Glucose, capillary     Status: None   Collection Time: 10/14/19 10:14 AM  Result Value Ref Range   Glucose-Capillary 91 70 - 99 mg/dL    Comment: Glucose reference range applies only to samples taken after fasting for at least 8 hours.  Glucose, capillary     Status: Abnormal   Collection Time: 10/14/19 11:04 AM  Result Value Ref Range   Glucose-Capillary 118 (H) 70 - 99 mg/dL    Comment: Glucose reference range applies only to samples taken after fasting for at least 8 hours.  Glucose, capillary      Status: Abnormal   Collection Time: 10/14/19 12:21 PM  Result Value Ref Range   Glucose-Capillary 65 (L) 70 - 99 mg/dL    Comment: Glucose reference range applies only to samples taken after fasting for at least 8 hours.  Glucose, capillary     Status: Abnormal   Collection Time: 10/14/19 12:47 PM  Result Value Ref Range  Glucose-Capillary 119 (H) 70 - 99 mg/dL    Comment: Glucose reference range applies only to samples taken after fasting for at least 8 hours.  Glucose, capillary     Status: Abnormal   Collection Time: 10/14/19  4:07 PM  Result Value Ref Range   Glucose-Capillary 35 (LL) 70 - 99 mg/dL    Comment: Glucose reference range applies only to samples taken after fasting for at least 8 hours.   Comment 1 Notify RN   Glucose, capillary     Status: Abnormal   Collection Time: 10/14/19  4:26 PM  Result Value Ref Range   Glucose-Capillary 122 (H) 70 - 99 mg/dL    Comment: Glucose reference range applies only to samples taken after fasting for at least 8 hours.  Glucose, capillary     Status: Abnormal   Collection Time: 10/14/19  8:39 PM  Result Value Ref Range   Glucose-Capillary 66 (L) 70 - 99 mg/dL    Comment: Glucose reference range applies only to samples taken after fasting for at least 8 hours.   Comment 1 Notify RN   Glucose, capillary     Status: Abnormal   Collection Time: 10/14/19  9:46 PM  Result Value Ref Range   Glucose-Capillary 59 (L) 70 - 99 mg/dL    Comment: Glucose reference range applies only to samples taken after fasting for at least 8 hours.  Glucose, capillary     Status: Abnormal   Collection Time: 10/14/19 10:25 PM  Result Value Ref Range   Glucose-Capillary 48 (L) 70 - 99 mg/dL    Comment: Glucose reference range applies only to samples taken after fasting for at least 8 hours.   Comment 1 Notify RN   Glucose, capillary     Status: Abnormal   Collection Time: 10/14/19 11:28 PM  Result Value Ref Range   Glucose-Capillary 177 (H) 70 - 99 mg/dL     Comment: Glucose reference range applies only to samples taken after fasting for at least 8 hours.   Comment 1 Notify RN   Glucose, capillary     Status: Abnormal   Collection Time: 10/15/19  1:57 AM  Result Value Ref Range   Glucose-Capillary 59 (L) 70 - 99 mg/dL    Comment: Glucose reference range applies only to samples taken after fasting for at least 8 hours.  Glucose, capillary     Status: Abnormal   Collection Time: 10/15/19  2:41 AM  Result Value Ref Range   Glucose-Capillary 150 (H) 70 - 99 mg/dL    Comment: Glucose reference range applies only to samples taken after fasting for at least 8 hours.  Glucose, capillary     Status: None   Collection Time: 10/15/19  4:04 AM  Result Value Ref Range   Glucose-Capillary 81 70 - 99 mg/dL    Comment: Glucose reference range applies only to samples taken after fasting for at least 8 hours.  Glucose, capillary     Status: Abnormal   Collection Time: 10/15/19  4:59 AM  Result Value Ref Range   Glucose-Capillary 65 (L) 70 - 99 mg/dL    Comment: Glucose reference range applies only to samples taken after fasting for at least 8 hours.  Glucose, capillary     Status: Abnormal   Collection Time: 10/15/19  6:04 AM  Result Value Ref Range   Glucose-Capillary 196 (H) 70 - 99 mg/dL    Comment: Glucose reference range applies only to samples taken after fasting for at least  8 hours.  Glucose, capillary     Status: Abnormal   Collection Time: 10/15/19  7:43 AM  Result Value Ref Range   Glucose-Capillary 183 (H) 70 - 99 mg/dL    Comment: Glucose reference range applies only to samples taken after fasting for at least 8 hours.  Glucose, capillary     Status: Abnormal   Collection Time: 10/15/19 11:34 AM  Result Value Ref Range   Glucose-Capillary 149 (H) 70 - 99 mg/dL    Comment: Glucose reference range applies only to samples taken after fasting for at least 8 hours.  Glucose, capillary     Status: Abnormal   Collection Time: 10/15/19  4:40  PM  Result Value Ref Range   Glucose-Capillary 166 (H) 70 - 99 mg/dL    Comment: Glucose reference range applies only to samples taken after fasting for at least 8 hours.  Glucose, capillary     Status: Abnormal   Collection Time: 10/15/19  9:13 PM  Result Value Ref Range   Glucose-Capillary 117 (H) 70 - 99 mg/dL    Comment: Glucose reference range applies only to samples taken after fasting for at least 8 hours.   Comment 1 Notify RN   Basic metabolic panel     Status: Abnormal   Collection Time: 10/16/19  3:15 AM  Result Value Ref Range   Sodium 127 (L) 135 - 145 mmol/L   Potassium 3.9 3.5 - 5.1 mmol/L   Chloride 95 (L) 98 - 111 mmol/L   CO2 24 22 - 32 mmol/L   Glucose, Bld 86 70 - 99 mg/dL    Comment: Glucose reference range applies only to samples taken after fasting for at least 8 hours.   BUN 18 6 - 20 mg/dL   Creatinine, Ser 1.37 (H) 0.44 - 1.00 mg/dL   Calcium 8.1 (L) 8.9 - 10.3 mg/dL   GFR calc non Af Amer 45 (L) >60 mL/min   GFR calc Af Amer 52 (L) >60 mL/min   Anion gap 8 5 - 15    Comment: Performed at [facility], [address] [place], [place] [address]  CBC     Status: Abnormal   Collection Time: 10/16/19  3:15 AM  Result Value Ref Range   WBC 7.6 4.0 - 10.5 K/uL   RBC 3.76 (L) 3.87 - 5.11 MIL/uL   Hemoglobin 10.4 (L) 12.0 - 15.0 g/dL   HCT 29.6 (L) 36 - 46 %   MCV 78.7 (L) 80.0 - 100.0 fL   MCH 27.7 26.0 - 34.0 pg   MCHC 35.1 30.0 - 36.0 g/dL   RDW 13.4 11.5 - 15.5 %   Platelets 140 (L) 150 - 400 K/uL   nRBC 0.0 0.0 - 0.2 %    Comment: Performed at [facility], [address] [place], [place] [address]  Glucose, capillary     Status: Abnormal   Collection Time: 10/16/19  5:22 AM  Result Value Ref Range   Glucose-Capillary 65 (L) 70 - 99 mg/dL    Comment: Glucose reference range applies only to samples taken after fasting for at least 8 hours.  Glucose, capillary     Status: None   Collection Time: 10/16/19  6:38 AM  Result Value  Ref Range   Glucose-Capillary 74 70 - 99 mg/dL    Comment: Glucose reference range applies only to samples taken after fasting for at least 8 hours.  Glucose, capillary     Status: None   Collection  Time: 10/16/19  7:34 AM  Result Value Ref Range   Glucose-Capillary 85 70 - 99 mg/dL    Comment: Glucose reference range applies only to samples taken after fasting for at least 8 hours.  Glucose, capillary     Status: Abnormal   Collection Time: 10/16/19 11:38 AM  Result Value Ref Range   Glucose-Capillary 257 (H) 70 - 99 mg/dL    Comment: Glucose reference range applies only to samples taken after fasting for at least 8 hours.  Glucose, capillary     Status: Abnormal   Collection Time: 10/16/19  3:51 PM  Result Value Ref Range   Glucose-Capillary 118 (H) 70 - 99 mg/dL    Comment: Glucose reference range applies only to samples taken after fasting for at least 8 hours.  Glucose, capillary     Status: None   Collection Time: 10/16/19 10:25 PM  Result Value Ref Range   Glucose-Capillary 75 70 - 99 mg/dL    Comment: Glucose reference range applies only to samples taken after fasting for at least 8 hours.  Glucose, capillary     Status: None   Collection Time: 10/17/19  2:15 AM  Result Value Ref Range   Glucose-Capillary 94 70 - 99 mg/dL    Comment: Glucose reference range applies only to samples taken after fasting for at least 8 hours.  Basic metabolic panel     Status: Abnormal   Collection Time: 10/17/19  6:30 AM  Result Value Ref Range   Sodium 138 135 - 145 mmol/L   Potassium 4.5 3.5 - 5.1 mmol/L   Chloride 104 98 - 111 mmol/L   CO2 25 22 - 32 mmol/L   Glucose, Bld 85 70 - 99 mg/dL    Comment: Glucose reference range applies only to samples taken after fasting for at least 8 hours.   BUN 19 6 - 20 mg/dL   Creatinine, Ser 1.50 (H) 0.44 - 1.00 mg/dL   Calcium 8.7 (L) 8.9 - 10.3 mg/dL   GFR calc non Af Amer 40 (L) >60 mL/min   GFR calc Af Amer 47 (L) >60 mL/min   Anion gap 9 5 -  15    Comment: Performed at [facility], [address] [place], [place] [address]  Glucose, capillary     Status: None   Collection Time: 10/17/19  7:45 AM  Result Value Ref Range   Glucose-Capillary 91 70 - 99 mg/dL    Comment: Glucose reference range applies only to samples taken after fasting for at least 8 hours.  Glucose, capillary     Status: Abnormal   Collection Time: 10/17/19 11:24 AM  Result Value Ref Range   Glucose-Capillary 174 (H) 70 - 99 mg/dL    Comment: Glucose reference range applies only to samples taken after fasting for at least 8 hours.  Glucose, capillary     Status: Abnormal   Collection Time: 10/17/19  4:05 PM  Result Value Ref Range   Glucose-Capillary 103 (H) 70 - 99 mg/dL    Comment: Glucose reference range applies only to samples taken after fasting for at least 8 hours.  Troponin I (High Sensitivity)     Status: None   Collection Time: 10/17/19  4:45 PM  Result Value Ref Range   Troponin I (High Sensitivity) 10 <18 ng/L    Comment: (NOTE) Elevated high sensitivity troponin I (hsTnI) values and significant  changes across serial measurements may suggest ACS but many other  chronic and acute conditions  are known to elevate hsTnI results.  Refer to the "Links" section for chest pain algorithms and additional  guidance. Performed at [facility], [address] [place], [place] [address]   Glucose, capillary     Status: Abnormal   Collection Time: 10/17/19  8:06 PM  Result Value Ref Range   Glucose-Capillary 150 (H) 70 - 99 mg/dL    Comment: Glucose reference range applies only to samples taken after fasting for at least 8 hours.  Troponin I (High Sensitivity)     Status: None   Collection Time: 10/17/19  8:39 PM  Result Value Ref Range   Troponin I (High Sensitivity) 13 <18 ng/L    Comment: (NOTE) Elevated high sensitivity troponin I (hsTnI) values and significant  changes across serial measurements may suggest ACS but many  other  chronic and acute conditions are known to elevate hsTnI results.  Refer to the "Links" section for chest pain algorithms and additional  guidance. Performed at [facility], [address] [place], [place] [address]   Brain natriuretic peptide     Status: Abnormal   Collection Time: 10/17/19  8:40 PM  Result Value Ref Range   B Natriuretic Peptide 130.3 (H) 0.0 - 100.0 pg/mL    Comment: Performed at [facility], [address] [place], [place] [address]  Comprehensive metabolic panel     Status: Abnormal   Collection Time: 10/17/19  8:41 PM  Result Value Ref Range   Sodium 128 (L) 135 - 145 mmol/L   Potassium 5.0 3.5 - 5.1 mmol/L   Chloride 96 (L) 98 - 111 mmol/L   CO2 24 22 - 32 mmol/L   Glucose, Bld 135 (H) 70 - 99 mg/dL    Comment: Glucose reference range applies only to samples taken after fasting for at least 8 hours.   BUN 18 6 - 20 mg/dL   Creatinine, Ser 1.51 (H) 0.44 - 1.00 mg/dL   Calcium 8.3 (L) 8.9 - 10.3 mg/dL   Total Protein 6.4 (L) 6.5 - 8.1 g/dL   Albumin 3.1 (L) 3.5 - 5.0 g/dL   AST 26 15 - 41 U/L   ALT 22 0 - 44 U/L   Alkaline Phosphatase 78 38 - 126 U/L   Total Bilirubin 0.7 0.3 - 1.2 mg/dL   GFR calc non Af Amer 40 (L) >60 mL/min   GFR calc Af Amer 46 (L) >60 mL/min   Anion gap 8 5 - 15    Comment: Performed at [facility], [address] [place], [place] [address]  CBC with Differential/Platelet     Status: Abnormal   Collection Time: 10/17/19  8:41 PM  Result Value Ref Range   WBC 12.5 (H) 4.0 - 10.5 K/uL   RBC 3.98 3.87 - 5.11 MIL/uL   Hemoglobin 11.0 (L) 12.0 - 15.0 g/dL   HCT 33.2 (L) 36 - 46 %   MCV 83.4 80.0 - 100.0 fL   MCH 27.6 26.0 - 34.0 pg   MCHC 33.1 30.0 - 36.0 g/dL   RDW 13.4 11.5 - 15.5 %   Platelets 134 (L) 150 - 400 K/uL   nRBC 0.0 0.0 - 0.2 %   Neutrophils Relative % 82 %   Neutro Abs 10.3 (H) 1.7 - 7.7 K/uL   Lymphocytes Relative 5 %   Lymphs Abs 0.6 (L) 0.7 - 4.0 K/uL   Monocytes  Relative 10 %   Monocytes Absolute 1.2 (H) 0.1 - 1.0 K/uL  Eosinophils Relative 1 %   Eosinophils Absolute 0.1 0.0 - 0.5 K/uL   Basophils Relative 1 %   Basophils Absolute 0.1 0.0 - 0.1 K/uL   Immature Granulocytes 1 %   Abs Immature Granulocytes 0.16 (H) 0.00 - 0.07 K/uL    Comment: Performed at [facility], [address], [place], [place] [address]  Magnesium     Status: Abnormal   Collection Time: 10/17/19  8:41 PM  Result Value Ref Range   Magnesium 1.4 (L) 1.7 - 2.4 mg/dL    Comment: Performed at [facility], [address] [place], [place] [address]  Lactic acid, plasma     Status: None   Collection Time: 10/17/19  8:44 PM  Result Value Ref Range   Lactic Acid, Venous 1.3 0.5 - 1.9 mmol/L    Comment: Performed at [facility], [place], [place] [address]  Troponin I (High Sensitivity)     Status: None   Collection Time: 10/17/19 10:22 PM  Result Value Ref Range   Troponin I (High Sensitivity) 13 <18 ng/L    Comment: (NOTE) Elevated high sensitivity troponin I (hsTnI) values and significant  changes across serial measurements may suggest ACS but many other  chronic and acute conditions are known to elevate hsTnI results.  Refer to the "Links" section for chest pain algorithms and additional  guidance. Performed at [facility], [address] [place],  [address]   Procalcitonin     Status: None   Collection Time: 10/17/19 10:22 PM  Result Value Ref Range   Procalcitonin 0.28 ng/mL    Comment:        Interpretation: PCT (Procalcitonin) <= 0.5 ng/mL: Systemic infection (sepsis) is not likely. Local bacterial infection is possible. (NOTE)       Sepsis PCT Algorithm           Lower Respiratory Tract                                      Infection PCT Algorithm    ----------------------------     ----------------------------         PCT < 0.25 ng/mL                PCT < 0.10 ng/mL          Strongly  encourage             Strongly discourage   discontinuation of antibiotics    initiation of antibiotics    ----------------------------     -----------------------------       PCT 0.25 - 0.50 ng/mL            PCT 0.10 - 0.25 ng/mL               OR       >80% decrease in PCT            Discourage initiation of                                            antibiotics      Encourage discontinuation           of antibiotics    ----------------------------     -----------------------------         PCT >=  0.50 ng/mL              PCT 0.26 - 0.50 ng/mL               AND        <80% decrease in PCT             Encourage initiation of                                             antibiotics       Encourage continuation           of antibiotics    ----------------------------     -----------------------------        PCT >= 0.50 ng/mL                  PCT > 0.50 ng/mL               AND         increase in PCT                  Strongly encourage                                      initiation of antibiotics    Strongly encourage escalation           of antibiotics                                     -----------------------------                                           PCT <= 0.25 ng/mL                                                 OR                                        > 80% decrease in PCT                                      Discontinue / Do not initiate                                             antibiotics  Performed at [facility], [address] [place], [place] [address]   Glucose, capillary     Status: None   Collection Time: 10/18/19  4:27 AM  Result Value Ref Range   Glucose-Capillary 91 70 - 99 mg/dL    Comment: Glucose reference range applies only to samples taken after fasting for at least 8 hours.  Basic metabolic panel  Status: Abnormal   Collection Time: 10/18/19  4:39 AM  Result Value Ref Range   Sodium 130 (L) 135 - 145 mmol/L   Potassium 4.5 3.5 -  5.1 mmol/L   Chloride 94 (L) 98 - 111 mmol/L   CO2 24 22 - 32 mmol/L   Glucose, Bld 92 70 - 99 mg/dL    Comment: Glucose reference range applies only to samples taken after fasting for at least 8 hours.   BUN 25 (H) 6 - 20 mg/dL   Creatinine, Ser 1.70 (H) 0.44 - 1.00 mg/dL   Calcium 9.0 8.9 - 10.3 mg/dL   GFR calc non Af Amer 35 (L) >60 mL/min   GFR calc Af Amer 40 (L) >60 mL/min   Anion gap 12 5 - 15    Comment: Performed at [facility], [address] [place], [place] [address]  CBC with Differential/Platelet     Status: Abnormal   Collection Time: 10/18/19  4:39 AM  Result Value Ref Range   WBC 20.5 (H) 4.0 - 10.5 K/uL   RBC 4.33 3.87 - 5.11 MIL/uL   Hemoglobin 12.0 12.0 - 15.0 g/dL   HCT 36.2 36 - 46 %   MCV 83.6 80.0 - 100.0 fL   MCH 27.7 26.0 - 34.0 pg   MCHC 33.1 30.0 - 36.0 g/dL   RDW 13.5 11.5 - 15.5 %   Platelets 163 150 - 400 K/uL   nRBC 0.0 0.0 - 0.2 %   Neutrophils Relative % 84 %   Neutro Abs 17.5 (H) 1.7 - 7.7 K/uL   Lymphocytes Relative 5 %   Lymphs Abs 0.9 0.7 - 4.0 K/uL   Monocytes Relative 9 %   Monocytes Absolute 1.8 (H) 0.1 - 1.0 K/uL   Eosinophils Relative 0 %   Eosinophils Absolute 0.0 0.0 - 0.5 K/uL   Basophils Relative 1 %   Basophils Absolute 0.1 0.0 - 0.1 K/uL   Immature Granulocytes 1 %   Abs Immature Granulocytes 0.17 (H) 0.00 - 0.07 K/uL    Comment: Performed at [facility], [address], [place], [place] [address]  Magnesium     Status: Abnormal   Collection Time: 10/18/19  4:39 AM  Result Value Ref Range   Magnesium 2.6 (H) 1.7 - 2.4 mg/dL    Comment: Performed at [facility], [address] [place], [place] [address]  Fibrin derivatives D-[facility] only)     Status: None   Collection Time: 10/18/19  5:23 AM  Result Value Ref Range   Fibrin derivatives D-dimer ([facility]) 429.22 0.00 - 499.00 ng/mL (FEU)    Comment: (NOTE) <> Exclusion of Venous Thromboembolism (VTE) - OUTPATIENT ONLY   (Emergency  Department or [place])    0-499 ng/ml (FEU): With a low to intermediate pretest probability                      for VTE this test result excludes the diagnosis                      of VTE.   >499 ng/ml (FEU) : VTE not excluded; additional work up for VTE is                      required.  <> Testing on Inpatients and Evaluation of Disseminated Intravascular   Coagulation (DIC) Reference Range:   0-499 ng/ml (FEU) Performed at [facility], [address] [place], [place] [address]   TSH     Status: None   Collection Time: 10/18/19  5:23 AM  Result Value Ref Range   TSH 1.544 0.350 - 4.500 uIU/mL    Comment: Performed by a 3rd Generation assay with a functional sensitivity of <=0.01 uIU/mL. Performed at [facility], [address] [place], [place] [address]   CULTURE, BLOOD (ROUTINE X 2) w Reflex to ID Panel     Status: None   Collection Time: 10/18/19  5:23 AM   Specimen: BLOOD  Result Value Ref Range   Specimen Description BLOOD RIGHT AC    Special Requests      BOTTLES DRAWN AEROBIC AND ANAEROBIC Blood Culture adequate volume   Culture      NO GROWTH 5 DAYS Performed at [facility], [address] [place], [name] [address]    Report Status 10/23/2019 FINAL   CULTURE, BLOOD (ROUTINE X 2) w Reflex to ID Panel     Status: None   Collection Time: 10/18/19  5:29 AM   Specimen: BLOOD  Result Value Ref Range   Specimen Description BLOOD LEFT AC    Special Requests      BOTTLES DRAWN AEROBIC AND ANAEROBIC Blood Culture adequate volume   Culture      NO GROWTH 5 DAYS Performed at [facility], [address] [place], [place] [address]    Report Status 10/23/2019 FINAL   Glucose, capillary     Status: None   Collection Time: 10/18/19  7:30 AM  Result Value Ref Range   Glucose-Capillary 97 70 - 99 mg/dL    Comment: Glucose reference range applies only to samples taken after fasting for at least 8 hours.  Urinalysis, Complete w  Microscopic     Status: Abnormal   Collection Time: 10/18/19  8:26 AM  Result Value Ref Range   Color, Urine YELLOW (A) YELLOW   APPearance HAZY (A) CLEAR   Specific Gravity, Urine 1.013 1.005 - 1.030   pH 5.0 5.0 - 8.0   Glucose, UA NEGATIVE NEGATIVE mg/dL   Hgb urine dipstick NEGATIVE NEGATIVE   Bilirubin Urine NEGATIVE NEGATIVE   Ketones, ur NEGATIVE NEGATIVE mg/dL   Protein, ur NEGATIVE NEGATIVE mg/dL   Nitrite NEGATIVE NEGATIVE   Leukocytes,Ua LARGE (A) NEGATIVE   RBC / HPF 6-10 0 - 5 RBC/hpf   WBC, UA 21-50 0 - 5 WBC/hpf   Bacteria, UA FEW (A) NONE SEEN   Squamous Epithelial / LPF NONE SEEN 0 - 5    Comment: Performed at [facility], [address] [place], [place] [address]  Glucose, capillary     Status: None   Collection Time: 10/18/19 11:36 AM  Result Value Ref Range   Glucose-Capillary 98 70 - 99 mg/dL    Comment: Glucose reference range applies only to samples taken after fasting for at least 8 hours.  SARS Coronavirus 2 by RT PCR (hospital order, performed in [facility] hospital lab) Nasopharyngeal Nasopharyngeal Swab     Status: None   Collection Time: 10/18/19  4:21 PM   Specimen: Nasopharyngeal Swab  Result Value Ref Range   SARS Coronavirus 2 NEGATIVE NEGATIVE    Comment: (NOTE) SARS-CoV-2 target nucleic acids are NOT DETECTED.  The SARS-CoV-2 RNA is generally detectable in upper and lower respiratory specimens during the acute phase of infection. The lowest concentration of SARS-CoV-2 viral copies this assay can detect is 250 copies / mL. A negative result does not preclude  SARS-CoV-2 infection and should not be used as the sole basis for treatment or other patient management decisions.  A negative result may occur with improper specimen collection / handling, submission of specimen other than nasopharyngeal swab, presence of viral mutation(s) within the areas targeted by this assay, and inadequate number of viral copies (<250 copies / mL). A  negative result must be combined with clinical observations, patient history, and epidemiological information.  Fact Sheet for Patients:   StrictlyIdeas.no  Fact Sheet for Healthcare Providers: BankingDealers.co.za  This test is not yet approved or  cleared by the [place] FDA and has been authorized for detection and/or diagnosis of SARS-CoV-2 by FDA under an Emergency Use Authorization (EUA).  This EUA will remain in effect (meaning this test can be used) for the duration of the COVID-19 declaration under Section 564(b)(1) of the Act, 21 U.S.C. section 360bbb-3(b)(1), unless the authorization is terminated or revoked sooner.  Performed at [facility], [address] [place], [place] [address]   Glucose, capillary     Status: Abnormal   Collection Time: 10/18/19  4:46 PM  Result Value Ref Range   Glucose-Capillary 100 (H) 70 - 99 mg/dL    Comment: Glucose reference range applies only to samples taken after fasting for at least 8 hours.  Glucose, capillary     Status: None   Collection Time: 10/18/19  9:01 PM  Result Value Ref Range   Glucose-Capillary 89 70 - 99 mg/dL    Comment: Glucose reference range applies only to samples taken after fasting for at least 8 hours.  Basic metabolic panel     Status: Abnormal   Collection Time: 10/19/19  6:02 AM  Result Value Ref Range   Sodium 127 (L) 135 - 145 mmol/L   Potassium 4.6 3.5 - 5.1 mmol/L   Chloride 92 (L) 98 - 111 mmol/L   CO2 25 22 - 32 mmol/L   Glucose, Bld 78 70 - 99 mg/dL    Comment: Glucose reference range applies only to samples taken after fasting for at least 8 hours.   BUN 40 (H) 6 - 20 mg/dL   Creatinine, Ser 2.03 (H) 0.44 - 1.00 mg/dL   Calcium 8.8 (L) 8.9 - 10.3 mg/dL   GFR calc non Af Amer 28 (L) >60 mL/min   GFR calc Af Amer 32 (L) >60 mL/min   Anion gap 10 5 - 15    Comment: Performed at [facility], [address] [place],  [place] [address]  CBC with Differential/Platelet     Status: Abnormal   Collection Time: 10/19/19  6:02 AM  Result Value Ref Range   WBC 17.0 (H) 4.0 - 10.5 K/uL   RBC 3.69 (L) 3.87 - 5.11 MIL/uL   Hemoglobin 10.3 (L) 12.0 - 15.0 g/dL   HCT 30.7 (L) 36 - 46 %   MCV 83.2 80.0 - 100.0 fL   MCH 27.9 26.0 - 34.0 pg   MCHC 33.6 30.0 - 36.0 g/dL   RDW 13.6 11.5 - 15.5 %   Platelets 140 (L) 150 - 400 K/uL   nRBC 0.0 0.0 - 0.2 %   Neutrophils Relative % 82 %   Neutro Abs 14.2 (H) 1.7 - 7.7 K/uL   Lymphocytes Relative 8 %   Lymphs Abs 1.3 0.7 - 4.0 K/uL   Monocytes Relative 7 %   Monocytes Absolute 1.2 (H) 0.1 - 1.0 K/uL   Eosinophils Relative 1 %   Eosinophils Absolute 0.2 0.0 -  0.5 K/uL   Basophils Relative 1 %   Basophils Absolute 0.1 0.0 - 0.1 K/uL   Immature Granulocytes 1 %   Abs Immature Granulocytes 0.10 (H) 0.00 - 0.07 K/uL    Comment: Performed at [facility], [address], [place], [place] [address]  Magnesium     Status: None   Collection Time: 10/19/19  6:02 AM  Result Value Ref Range   Magnesium 2.4 1.7 - 2.4 mg/dL    Comment: Performed at [facility], [address] [place],  [address]  Procalcitonin - Baseline     Status: None   Collection Time: 10/19/19  6:02 AM  Result Value Ref Range   Procalcitonin 11.04 ng/mL    Comment:        Interpretation: PCT >= 10 ng/mL: Important systemic inflammatory response, almost exclusively due to severe bacterial sepsis or septic shock. (NOTE)       Sepsis PCT Algorithm           Lower Respiratory Tract                                      Infection PCT Algorithm    ----------------------------     ----------------------------         PCT < 0.25 ng/mL                PCT < 0.10 ng/mL          Strongly encourage             Strongly discourage   discontinuation of antibiotics    initiation of antibiotics    ----------------------------     -----------------------------       PCT 0.25 - 0.50 ng/mL             PCT 0.10 - 0.25 ng/mL               OR       >80% decrease in PCT            Discourage initiation of                                            antibiotics      Encourage discontinuation           of antibiotics    ----------------------------     -----------------------------         PCT >= 0.50 ng/mL              PCT 0.26 - 0.50 ng/mL                AND       <80% decrease in PCT             Encourage initiation of                                             antibiotics       Encourage continuation           of antibiotics    ----------------------------     -----------------------------        PCT >= 0.50 ng/mL  PCT > 0.50 ng/mL               AND         increase in PCT                  Strongly encourage                                      initiation of antibiotics    Strongly encourage escalation           of antibiotics                                     -----------------------------                                           PCT <= 0.25 ng/mL                                                 OR                                        > 80% decrease in PCT                                      Discontinue / Do not initiate                                             antibiotics  Performed at [facility], [address] [place], [place] [address]   Glucose, capillary     Status: Abnormal   Collection Time: 10/19/19  7:40 AM  Result Value Ref Range   Glucose-Capillary 60 (L) 70 - 99 mg/dL    Comment: Glucose reference range applies only to samples taken after fasting for at least 8 hours.  Glucose, capillary     Status: Abnormal   Collection Time: 10/19/19  8:08 AM  Result Value Ref Range   Glucose-Capillary 69 (L) 70 - 99 mg/dL    Comment: Glucose reference range applies only to samples taken after fasting for at least 8 hours.   Comment 1 Notify RN   Glucose, capillary     Status: Abnormal   Collection Time: 10/19/19  8:24 AM  Result  Value Ref Range   Glucose-Capillary 133 (H) 70 - 99 mg/dL    Comment: Glucose reference range applies only to samples taken after fasting for at least 8 hours.   Comment 1 Notify RN   Glucose, capillary     Status: None   Collection Time: 10/19/19 11:44 AM  Result Value Ref Range   Glucose-Capillary 84 70 - 99 mg/dL    Comment: Glucose reference range applies only to samples taken after fasting for at least 8 hours.   Comment  1 Notify RN   Glucose, capillary     Status: None   Collection Time: 10/19/19  2:49 PM  Result Value Ref Range   Glucose-Capillary 91 70 - 99 mg/dL    Comment: Glucose reference range applies only to samples taken after fasting for at least 8 hours.  Glucose, capillary     Status: None   Collection Time: 10/19/19  4:26 PM  Result Value Ref Range   Glucose-Capillary 71 70 - 99 mg/dL    Comment: Glucose reference range applies only to samples taken after fasting for at least 8 hours.   Comment 1 Notify RN    Comment 2 Document in Chart   Glucose, capillary     Status: None   Collection Time: 10/19/19  9:11 PM  Result Value Ref Range   Glucose-Capillary 81 70 - 99 mg/dL    Comment: Glucose reference range applies only to samples taken after fasting for at least 8 hours.  Glucose, capillary     Status: Abnormal   Collection Time: 10/19/19 10:24 PM  Result Value Ref Range   Glucose-Capillary 62 (L) 70 - 99 mg/dL    Comment: Glucose reference range applies only to samples taken after fasting for at least 8 hours.   Comment 1 Notify RN   Basic metabolic panel     Status: Abnormal   Collection Time: 10/20/19  4:28 AM  Result Value Ref Range   Sodium 135 135 - 145 mmol/L   Potassium 4.8 3.5 - 5.1 mmol/L   Chloride 99 98 - 111 mmol/L   CO2 26 22 - 32 mmol/L   Glucose, Bld 72 70 - 99 mg/dL    Comment: Glucose reference range applies only to samples taken after fasting for at least 8 hours.   BUN 35 (H) 6 - 20 mg/dL   Creatinine, Ser 1.85 (H) 0.44 - 1.00 mg/dL    Calcium 8.7 (L) 8.9 - 10.3 mg/dL   GFR calc non Af Amer 31 (L) >60 mL/min   GFR calc Af Amer 36 (L) >60 mL/min   Anion gap 10 5 - 15    Comment: Performed at [facility], [address] [place], [place] [address]  CBC with Differential/Platelet     Status: Abnormal   Collection Time: 10/20/19  4:28 AM  Result Value Ref Range   WBC 10.6 (H) 4.0 - 10.5 K/uL   RBC 3.59 (L) 3.87 - 5.11 MIL/uL   Hemoglobin 10.1 (L) 12.0 - 15.0 g/dL   HCT 29.7 (L) 36 - 46 %   MCV 82.7 80.0 - 100.0 fL   MCH 28.1 26.0 - 34.0 pg   MCHC 34.0 30.0 - 36.0 g/dL   RDW 13.5 11.5 - 15.5 %   Platelets 136 (L) 150 - 400 K/uL   nRBC 0.0 0.0 - 0.2 %   Neutrophils Relative % 76 %   Neutro Abs 8.2 (H) 1.7 - 7.7 K/uL   Lymphocytes Relative 11 %   Lymphs Abs 1.2 0.7 - 4.0 K/uL   Monocytes Relative 8 %   Monocytes Absolute 0.8 0.1 - 1.0 K/uL   Eosinophils Relative 3 %   Eosinophils Absolute 0.3 0.0 - 0.5 K/uL   Basophils Relative 1 %   Basophils Absolute 0.1 0.0 - 0.1 K/uL   Immature Granulocytes 1 %   Abs Immature Granulocytes 0.07 0.00 - 0.07 K/uL    Comment: Performed at [facility], [address], [place], [place] [address]  Magnesium  Status: None   Collection Time: 10/20/19  4:28 AM  Result Value Ref Range   Magnesium 2.1 1.7 - 2.4 mg/dL    Comment: Performed at [facility], [address] [place], [place] [address]  Procalcitonin     Status: None   Collection Time: 10/20/19  4:28 AM  Result Value Ref Range   Procalcitonin 6.38 ng/mL    Comment:        Interpretation: PCT > 2 ng/mL: Systemic infection (sepsis) is likely, unless other causes are known. (NOTE)       Sepsis PCT Algorithm           Lower Respiratory Tract                                      Infection PCT Algorithm    ----------------------------     ----------------------------         PCT < 0.25 ng/mL                PCT < 0.10 ng/mL          Strongly encourage             Strongly discourage    discontinuation of antibiotics    initiation of antibiotics    ----------------------------     -----------------------------       PCT 0.25 - 0.50 ng/mL            PCT 0.10 - 0.25 ng/mL               OR       >80% decrease in PCT            Discourage initiation of                                            antibiotics      Encourage discontinuation           of antibiotics    ----------------------------     -----------------------------         PCT >= 0.50 ng/mL              PCT 0.26 - 0.50 ng/mL               AND       <80% decrease in PCT              Encourage initiation of                                             antibiotics       Encourage continuation           of antibiotics    ----------------------------     -----------------------------        PCT >= 0.50 ng/mL                  PCT > 0.50 ng/mL               AND         increase in PCT                  Strongly encourage  initiation of antibiotics    Strongly encourage escalation           of antibiotics                                     -----------------------------                                           PCT <= 0.25 ng/mL                                                 OR                                        > 80% decrease in PCT                                      Discontinue / Do not initiate                                             antibiotics  Performed at [facility], [address] [place], [place] [address]   Glucose, capillary     Status: None   Collection Time: 10/20/19  8:04 AM  Result Value Ref Range   Glucose-Capillary 79 70 - 99 mg/dL    Comment: Glucose reference range applies only to samples taken after fasting for at least 8 hours.  Glucose, capillary     Status: Abnormal   Collection Time: 10/20/19 12:06 PM  Result Value Ref Range   Glucose-Capillary 141 (H) 70 - 99 mg/dL    Comment: Glucose reference range applies only to samples  taken after fasting for at least 8 hours.  Glucose, capillary     Status: None   Collection Time: 10/20/19  4:22 PM  Result Value Ref Range   Glucose-Capillary 87 70 - 99 mg/dL    Comment: Glucose reference range applies only to samples taken after fasting for at least 8 hours.  Basic metabolic panel     Status: Abnormal   Collection Time: 11/05/19 12:47 PM  Result Value Ref Range   Sodium 139 135 - 145 mmol/L   Potassium 5.1 3.5 - 5.1 mmol/L   Chloride 108 98 - 111 mmol/L   CO2 23 22 - 32 mmol/L   Glucose, Bld 94 70 - 99 mg/dL    Comment: Glucose reference range applies only to samples taken after fasting for at least 8 hours.   BUN 27 (H) 6 - 20 mg/dL   Creatinine, Ser 2.08 (H) 0.44 - 1.00 mg/dL   Calcium 8.9 8.9 - 10.3 mg/dL   GFR calc non Af Amer 27 (L) >60 mL/min   GFR calc Af Amer 31 (L) >60 mL/min   Anion gap 8 5 - 15    Comment: Performed at [facility], [address], [place], [place] [address]  CBC     Status: Abnormal   Collection Time: 11/05/19 12:47 PM  Result Value Ref Range   WBC 7.3 4.0 - 10.5 K/uL   RBC 4.11 3.87 - 5.11 MIL/uL   Hemoglobin 11.5 (L) 12.0 - 15.0 g/dL   HCT 34.3 (L) 36 - 46 %   MCV 83.5 80.0 - 100.0 fL   MCH 28.0 26.0 - 34.0 pg   MCHC 33.5 30.0 - 36.0 g/dL   RDW 13.9 11.5 - 15.5 %   Platelets 227 150 - 400 K/uL   nRBC 0.0 0.0 - 0.2 %    Comment: Performed at [facility], [place], [place] [address]  Troponin I (High Sensitivity)     Status: None   Collection Time: 11/05/19 12:47 PM  Result Value Ref Range   Troponin I (High Sensitivity) 7 <18 ng/L    Comment: (NOTE) Elevated high sensitivity troponin I (hsTnI) values and significant  changes across serial measurements may suggest ACS but many other  chronic and acute conditions are known to elevate hsTnI results.  Refer to the "Links" section for chest pain algorithms and additional  guidance. Performed at [facility], [address]  [place], [place] [address]      Collection Time: 11/05/19 12:47 PM   Value Ref Range   B Natriuretic Peptide 24.9 0.0 - 100.0 pg/mL    Comment: Performed at [facility], [address] [place], [place] [address]     Collection Time: 11/05/19 12:47 PM   Value Ref Range   Fibrin derivatives D-dimer ([facility]) 1,090.87 (H) 0.00 - 499.00 ng/mL (FEU)    Comment: (NOTE) <> Exclusion of Venous Thromboembolism (VTE) - OUTPATIENT ONLY   (Emergency Department or [place])    0-499 ng/ml (FEU): With a low to intermediate pretest probability                      for VTE this test result excludes the diagnosis                      of VTE.   >499 ng/ml (FEU) : VTE not excluded; additional work up for VTE is                      required.  <> Testing on Inpatients and Evaluation of Disseminated Intravascular   Coagulation (DIC) Reference Range:   0-499 ng/ml (FEU) Performed at [facility], [address] [place], [place] [address]      Collection Time: 11/05/19  2:34 PM   Specimen: Nasopharyngeal Swab   Value Ref Range   SARS Coronavirus 2 by RT PCR NEGATIVE NEGATIVE    Comment: (NOTE) SARS-CoV-2 target nucleic acids are NOT DETECTED.  The SARS-CoV-2 RNA is generally detectable in upper respiratoy specimens during the acute phase of infection. The lowest concentration of SARS-CoV-2 viral copies this assay can detect is 131 copies/mL. A negative result does not preclude SARS-Cov-2 infection and should not be used as the sole basis for treatment or other patient management decisions. A negative result may occur with  improper specimen collection/handling, submission of specimen other than nasopharyngeal swab, presence of viral mutation(s) within the areas targeted by this assay, and inadequate number of viral copies (<131 copies/mL). A negative result must be combined with clinical observations, patient history, and epidemiological information. The expected result is  Negative.  Fact Sheet for Patients:  PinkCheek.be  Fact Sheet for  Healthcare Providers:  GravelBags.it  This test is no t yet approved or cleared by the [place] and  has been authorized for detection and/or diagnosis of SARS-CoV-2 by FDA under an Emergency Use Authorization (EUA). This EUA will remain  in effect (meaning this test can be used) for the duration of the COVID-19 declaration under Section 564(b)(1) of the Act, 21 U.S.C. section 360bbb-3(b)(1), unless the authorization is terminated or revoked sooner.     Influenza A by PCR NEGATIVE NEGATIVE   Influenza B by PCR NEGATIVE NEGATIVE    Comment: (NOTE) The Xpert Xpress SARS-CoV-2/FLU/RSV assay is intended as an aid in  the diagnosis of influenza from Nasopharyngeal swab specimens and  should not be used as a sole basis for treatment. Nasal washings and  aspirates are unacceptable for Xpert Xpress SARS-CoV-2/FLU/RSV  testing.  Fact Sheet for Patients: PinkCheek.be  Fact Sheet for Healthcare Providers: GravelBags.it  This test is not yet approved or cleared by the [place] FDA and  has been authorized for detection and/or diagnosis of SARS-CoV-2 by  FDA under an Emergency Use Authorization (EUA). This EUA will remain  in effect (meaning this test can be used) for the duration of the  Covid-19 declaration under Section 564(b)(1) of the Act, 21  U.S.C. section 360bbb-3(b)(1), unless the authorization is  terminated or revoked. Performed at [facility], [address] [place], [place] [address]      Collection Time: 11/05/19  2:57 PM   Value Ref Range   Troponin I (High Sensitivity) 7 <18 ng/L    Comment: (NOTE) Elevated high sensitivity troponin I (hsTnI) values and significant  changes across serial measurements may suggest ACS but many other  chronic and acute conditions  are known to elevate hsTnI results.  Refer to the "Links" section for chest pain algorithms and additional  guidance. Performed at [facility], [address] [place], [place] [address]      Collection Time: 11/05/19  4:30 PM   Value Ref Range   Troponin I (High Sensitivity) 7 <18 ng/L    Comment: (NOTE) Elevated high sensitivity troponin I (hsTnI) values and significant  changes across serial measurements may suggest ACS but many other  chronic and acute conditions are known to elevate hsTnI results.  Refer to the "Links" section for chest pain algorithms and additional  guidance. Performed at [facility], [address] [place], [place] [address]      Collection Time: 11/05/19  4:30 PM   Value Ref Range   aPTT 39 (H) 24 - 36 seconds    Comment:        IF BASELINE aPTT IS ELEVATED, SUGGEST PATIENT RISK ASSESSMENT BE USED TO DETERMINE APPROPRIATE ANTICOAGULANT THERAPY. Performed at [facility], [address] [place], [place] [address]      Collection Time: 11/05/19  4:30 PM   Value Ref Range   Prothrombin Time 13.3 11.4 - 15.2 seconds   INR 1.1 0.8 - 1.2    Comment: (NOTE) INR goal varies based on device and disease states. Performed at [facility], [address] [place], [place] [address]      Collection Time: 11/05/19  7:00 PM   Value Ref Range   Glucose-Capillary 115 (H) 70 - 99 mg/dL    Comment: Glucose reference range applies only to samples taken after fasting for at least 8 hours.     Collection Time: 11/05/19 10:04 PM   Value Ref Range  Heparin Unfractionated 0.38 0.30 - 0.70 IU/mL    Comment: (NOTE) If heparin results are below expected values, and patient dosage has  been confirmed, suggest follow up testing of antithrombin III levels. Performed at [facility], [address] [place], [place] [address]      Collection Time: 11/05/19 10:04 PM   Value Ref Range   Troponin I (High  Sensitivity) 7 <18 ng/L    Comment: (NOTE) Elevated high sensitivity troponin I (hsTnI) values and significant  changes across serial measurements may suggest ACS but many other  chronic and acute conditions are known to elevate hsTnI results.  Refer to the "Links" section for chest pain algorithms and additional  guidance. Performed at [facility], [address] [place], [place] [address]      Collection Time: 11/06/19  5:19 AM   Value Ref Range   Tricyclic, Ur Screen NONE DETECTED NONE DETECTED   Amphetamines, Ur Screen NONE DETECTED NONE DETECTED   MDMA (Ecstasy)Ur Screen NONE DETECTED NONE DETECTED   Cocaine Metabolite,Ur [place] NONE DETECTED NONE DETECTED   Opiate, Ur Screen NONE DETECTED NONE DETECTED   Phencyclidine (PCP) Ur S NONE DETECTED NONE DETECTED   Cannabinoid 50 Ng, Ur [place] NONE DETECTED NONE DETECTED   Barbiturates, Ur Screen NONE DETECTED NONE DETECTED   Benzodiazepine, Ur Scrn POSITIVE (A) NONE DETECTED   Methadone Scn, Ur NONE DETECTED NONE DETECTED    Comment: (NOTE) Tricyclics + metabolites, urine    Cutoff 1000 ng/mL Amphetamines + metabolites, urine  Cutoff 1000 ng/mL MDMA (Ecstasy), urine              Cutoff 500 ng/mL Cocaine Metabolite, urine          Cutoff 300 ng/mL Opiate + metabolites, urine        Cutoff 300 ng/mL Phencyclidine (PCP), urine         Cutoff 25 ng/mL Cannabinoid, urine                 Cutoff 50 ng/mL Barbiturates + metabolites, urine  Cutoff 200 ng/mL Benzodiazepine, urine              Cutoff 200 ng/mL Methadone, urine                   Cutoff 300 ng/mL  The urine drug screen provides only a preliminary, unconfirmed analytical test result and should not be used for non-medical purposes. Clinical consideration and professional judgment should be applied to any positive drug screen result due to possible interfering substances. A more specific alternate chemical method must be used in order to obtain a confirmed analytical  result. Gas chromatography / mass spectrometry (GC/MS) is the preferred confirm atory method. Performed at [facility], [address] [place], [place] [address]      Collection Time: 11/06/19  5:19 AM   Value Ref Range   Hgb A1c MFr Bld 5.2 4.8 - 5.6 %    Comment: (NOTE) Pre diabetes:          5.7%-6.4%  Diabetes:              >6.4%  Glycemic control for   <7.0% adults with diabetes    Mean Plasma Glucose 102.54 mg/dL    Comment: Performed at [facility] [address], [place], [place] [address]     Collection Time: 11/06/19  5:19 AM   Value Ref Range   Cholesterol 94 0 - 200 mg/dL   Triglycerides  84 <150 mg/dL   HDL 29 (L) >40 mg/dL   Total CHOL/HDL Ratio 3.2 RATIO   VLDL 17 0 - 40 mg/dL   LDL Cholesterol 48 0 - 99 mg/dL    Comment:        Total Cholesterol/HDL:CHD Risk Coronary Heart Disease Risk Table                     Men   Women  1/2 Average Risk   3.4   3.3  Average Risk       5.0   4.4  2 X Average Risk   9.6   7.1  3 X Average Risk  23.4   11.0        Use the calculated Patient Ratio above and the CHD Risk Table to determine the patient's CHD Risk.        ATP III CLASSIFICATION (LDL):  <100     mg/dL   Optimal  100-129  mg/dL   Near or Above                    Optimal  130-159  mg/dL   Borderline  160-189  mg/dL   High  >190     mg/dL   Very High Performed at [facility], [address] [place], [place] [address]      Collection Time: 11/06/19  5:19 AM  Result Value Ref Range   Heparin Unfractionated 0.30 0.30 - 0.70 IU/mL    Comment: (NOTE) If heparin results are below expected values, and patient dosage has  been confirmed, suggest follow up testing of antithrombin III levels. Performed at [facility], [address] [place], [place] [address]   CBC     Status: Abnormal   Collection Time: 11/06/19  5:19 AM  Result Value Ref Range   WBC 6.4 4.0 - 10.5 K/uL   RBC 3.71 (L) 3.87 - 5.11 MIL/uL    Hemoglobin 10.0 (L) 12.0 - 15.0 g/dL   HCT 31.8 (L) 36 - 46 %   MCV 85.7 80.0 - 100.0 fL   MCH 27.0 26.0 - 34.0 pg   MCHC 31.4 30.0 - 36.0 g/dL   RDW 13.8 11.5 - 15.5 %   Platelets 192 150 - 400 K/uL   nRBC 0.0 0.0 - 0.2 %    Comment: Performed at [facility], [address] [place], [place] [address]  Basic metabolic panel     Status: Abnormal   Collection Time: 11/06/19  8:07 AM  Result Value Ref Range   Sodium 140 135 - 145 mmol/L   Potassium 4.5 3.5 - 5.1 mmol/L   Chloride 108 98 - 111 mmol/L   CO2 24 22 - 32 mmol/L   Glucose, Bld 77 70 - 99 mg/dL    Comment: Glucose reference range applies only to samples taken after fasting for at least 8 hours.   BUN 25 (H) 6 - 20 mg/dL   Creatinine, Ser 1.95 (H) 0.44 - 1.00 mg/dL   Calcium 8.8 (L) 8.9 - 10.3 mg/dL   GFR calc non Af Amer 29 (L) >60 mL/min   GFR calc Af Amer 34 (L) >60 mL/min   Anion gap 8 5 - 15    Comment: Performed at [facility], [address] [place], [place] [address]  Glucose, capillary     Status: Abnormal   Collection Time: 11/06/19  8:50 AM  Result Value Ref Range   Glucose-Capillary 24 (  LL) 70 - 99 mg/dL    Comment: Glucose reference range applies only to samples taken after fasting for at least 8 hours. QUESTIONABLE RESULTS - CHARGE CREDITED REPEATED TO VERIFY Performed at [facility], [address] [place], [place] [address]    Comment 1 Notify RN    Comment 2 Document in Chart   Glucose, capillary     Status: Abnormal   Collection Time: 11/06/19  8:52 AM  Result Value Ref Range   Glucose-Capillary 60 (L) 70 - 99 mg/dL    Comment: Glucose reference range applies only to samples taken after fasting for at least 8 hours.   Comment 1 Notify RN    Comment 2 Document in Chart    Comment 3 Repeat Test   Glucose, capillary     Status: None   Collection Time: 11/06/19  9:54 AM  Result Value Ref Range   Glucose-Capillary 94 70 - 99 mg/dL    Comment: Glucose reference  range applies only to samples taken after fasting for at least 8 hours.   Comment 1 Notify RN    Comment 2 Document in Chart   ECHOCARDIOGRAM COMPLETE     Status: None   Collection Time: 11/06/19 11:06 AM  Result Value Ref Range   Weight 2,240 oz   Height 62 in   BP 186/72 mmHg   Ao pk vel 1.42 m/s   AV Area VTI 1.35 cm2   AR max vel 1.39 cm2   AV Mean grad 5.0 mmHg   AV Peak grad 8.1 mmHg   S' Lateral 3.39 cm   AV Area mean vel 1.16 cm2   Area-P 1/2 2.71 cm2  Glucose, capillary     Status: Abnormal   Collection Time: 11/06/19 12:22 PM  Result Value Ref Range   Glucose-Capillary 56 (L) 70 - 99 mg/dL    Comment: Glucose reference range applies only to samples taken after fasting for at least 8 hours.  Heparin level (unfractionated)     Status: Abnormal   Collection Time: 11/06/19 12:51 PM  Result Value Ref Range   Heparin Unfractionated 0.16 (L) 0.30 - 0.70 IU/mL    Comment: (NOTE) If heparin results are below expected values, and patient dosage has  been confirmed, suggest follow up testing of antithrombin III levels. Performed at [facility], [address] [place], [place] [address]   Glucose, capillary     Status: Abnormal   Collection Time: 11/06/19  1:11 PM  Result Value Ref Range   Glucose-Capillary 144 (H) 70 - 99 mg/dL    Comment: Glucose reference range applies only to samples taken after fasting for at least 8 hours.  Glucose, capillary     Status: Abnormal   Collection Time: 11/06/19  4:43 PM  Result Value Ref Range   Glucose-Capillary 198 (H) 70 - 99 mg/dL    Comment: Glucose reference range applies only to samples taken after fasting for at least 8 hours.  Glucose, capillary     Status: None   Collection Time: 11/06/19  8:19 PM  Result Value Ref Range   Glucose-Capillary 80 70 - 99 mg/dL    Comment: Glucose reference range applies only to samples taken after fasting for at least 8 hours.  Heparin level (unfractionated)     Status: Abnormal    Collection Time: 11/06/19  9:12 PM  Result Value Ref Range   Heparin Unfractionated 0.27 (L) 0.30 - 0.70 IU/mL    Comment: (NOTE) If heparin results  are below expected values, and patient dosage has  been confirmed, suggest follow up testing of antithrombin III levels. Performed at [facility], [address] [place], [place] [address]   Basic metabolic panel     Status: Abnormal   Collection Time: 11/07/19  5:00 AM  Result Value Ref Range   Sodium 143 135 - 145 mmol/L   Potassium 4.5 3.5 - 5.1 mmol/L   Chloride 113 (H) 98 - 111 mmol/L   CO2 22 22 - 32 mmol/L   Glucose, Bld 71 70 - 99 mg/dL    Comment: Glucose reference range applies only to samples taken after fasting for at least 8 hours.   BUN 26 (H) 6 - 20 mg/dL   Creatinine, Ser 1.48 (H) 0.44 - 1.00 mg/dL   Calcium 8.7 (L) 8.9 - 10.3 mg/dL   GFR calc non Af Amer 41 (L) >60 mL/min   GFR calc Af Amer 47 (L) >60 mL/min   Anion gap 8 5 - 15    Comment: Performed at [facility], [address] [place], [place] [address]  CBC     Status: Abnormal   Collection Time: 11/07/19  5:00 AM  Result Value Ref Range   WBC 7.1 4.0 - 10.5 K/uL   RBC 4.05 3.87 - 5.11 MIL/uL   Hemoglobin 11.1 (L) 12.0 - 15.0 g/dL   HCT 33.7 (L) 36 - 46 %   MCV 83.2 80.0 - 100.0 fL   MCH 27.4 26.0 - 34.0 pg   MCHC 32.9 30.0 - 36.0 g/dL   RDW 13.8 11.5 - 15.5 %   Platelets 177 150 - 400 K/uL   nRBC 0.0 0.0 - 0.2 %    Comment: Performed at [facility], [place], [place] [address]  Heparin level (unfractionated)     Status: None   Collection Time: 11/07/19  6:51 AM  Result Value Ref Range   Heparin Unfractionated 0.67 0.30 - 0.70 IU/mL    Comment: (NOTE) If heparin results are below expected values, and patient dosage has  been confirmed, suggest follow up testing of antithrombin III levels. Performed at [facility], [address] [place], [place] [address]   Glucose, capillary     Status:  Abnormal   Collection Time: 11/07/19  7:42 AM  Result Value Ref Range   Glucose-Capillary 54 (L) 70 - 99 mg/dL    Comment: Glucose reference range applies only to samples taken after fasting for at least 8 hours.  Glucose, capillary     Status: None   Collection Time: 11/07/19  8:21 AM  Result Value Ref Range   Glucose-Capillary 92 70 - 99 mg/dL    Comment: Glucose reference range applies only to samples taken after fasting for at least 8 hours.  Glucose, capillary     Status: Abnormal   Collection Time: 11/07/19 11:55 AM  Result Value Ref Range   Glucose-Capillary 155 (H) 70 - 99 mg/dL    Comment: Glucose reference range applies only to samples taken after fasting for at least 8 hours.  Glucose, capillary     Status: Abnormal   Collection Time: 11/07/19  4:35 PM  Result Value Ref Range   Glucose-Capillary 55 (L) 70 - 99 mg/dL    Comment: Glucose reference range applies only to samples taken after fasting for at least 8 hours.  Glucose, capillary     Status: Abnormal   Collection Time: 11/07/19  4:54 PM  Result Value Ref  Range   Glucose-Capillary 48 (L) 70 - 99 mg/dL    Comment: Glucose reference range applies only to samples taken after fasting for at least 8 hours.  Glucose, capillary     Status: Abnormal   Collection Time: 11/07/19  5:28 PM  Result Value Ref Range   Glucose-Capillary 140 (H) 70 - 99 mg/dL    Comment: Glucose reference range applies only to samples taken after fasting for at least 8 hours.  Basic metabolic panel     Status: Abnormal   Collection Time: 11/18/19 12:34 PM  Result Value Ref Range   Sodium 134 (L) 135 - 145 mmol/L   Potassium 5.0 3.5 - 5.1 mmol/L   Chloride 101 98 - 111 mmol/L   CO2 23 22 - 32 mmol/L   Glucose, Bld 273 (H) 70 - 99 mg/dL    Comment: Glucose reference range applies only to samples taken after fasting for at least 8 hours.   BUN 25 (H) 6 - 20 mg/dL   Creatinine, Ser 2.24 (H) 0.44 - 1.00 mg/dL   Calcium 9.3 8.9 - 10.3 mg/dL   GFR,  Estimated 25 (L) >60 mL/min   Anion gap 10 5 - 15    Comment: Performed at [facility], [address] [place], [place] [address]  CBC     Status: Abnorma   Collection Time: 11/18/19 12:34 PM  Result Value Ref Range   WBC 12.0 (H) 4.0 - 10.5 K/uL   RBC 4.49 3.87 - 5.11 MIL/uL   Hemoglobin 12.1 12.0 - 15.0 g/dL   HCT 38.0 36 - 46 %   MCV 84.6 80.0 - 100.0 fL   MCH 26.9 26.0 - 34.0 pg   MCHC 31.8 30.0 - 36.0 g/dL   RDW 13.9 11.5 - 15.5 %   Platelets 237 150 - 400 K/uL   nRBC 0.0 0.0 - 0.2 %    Comment: Performed at [facility], [place], [place] [address]  Troponin I (High Sensitivity)     Status: None   Collection Time: 11/18/19 12:34 PM  Result Value Ref Range   Troponin I (High Sensitivity) 8 <18 ng/L    Comment: (NOTE) Elevated high sensitivity troponin I (hsTnI) values and significant  changes across serial measurements may suggest ACS but many other  chronic and acute conditions are known to elevate hsTnI results.  Refer to the "Links" section for chest pain algorithms and additional  guidance. Performed at [facility], [place], [place] [address]   Troponin I (High Sensitivity)     Status: None   Collection Time: 11/18/19  3:29 PM  Result Value Ref Range   Troponin I (High Sensitivity) 6 <18 ng/L    Comment: (NOTE) Elevated high sensitivity troponin I (hsTnI) values and significant  changes across serial measurements may suggest ACS but many other  chronic and acute conditions are known to elevate hsTnI results.  Refer to the "Links" section for chest pain algorithms and additional  guidance. Performed at [facility], [address] [place], [place] [address]   CBC with Differential/Platelet- standing orders     Status: Abnormal   Collection Time: 12/06/19  1:50 PM  Result Value Ref Range   WBC 7.9 4.0 - 10.5 K/uL   RBC 4.15 3.87 - 5.11 MIL/uL   Hemoglobin 11.2 (L) 12.0 - 15.0 g/dL   HCT  33.9 (L) 36 - 46 %   MCV 81.7 80.0 - 100.0 fL  MCH 27.0 26.0 - 34.0 pg   MCHC 33.0 30.0 - 36.0 g/dL   RDW 13.2 11.5 - 15.5 %   Platelets 172 150 - 400 K/uL   nRBC 0.0 0.0 - 0.2 %   Neutrophils Relative % 65 %   Neutro Abs 5.1 1.7 - 7.7 K/uL   Lymphocytes Relative 24 %   Lymphs Abs 1.9 0.7 - 4.0 K/uL   Monocytes Relative 6 %   Monocytes Absolute 0.5 0.1 - 1.0 K/uL   Eosinophils Relative 4 %   Eosinophils Absolute 0.4 0.0 - 0.5 K/uL   Basophils Relative 1 %   Basophils Absolute 0.1 0.0 - 0.1 K/uL   Immature Granulocytes 0 %   Abs Immature Granulocytes 0.03 0.00 - 0.07 K/uL    Comment: Performed at [facility], [address] [place], [place] [address]  Ferritin     Status: None   Collection Time: 12/06/19  1:50 PM  Result Value Ref Range   Ferritin 66 11 - 307 ng/mL    Comment: Performed at [facility], [address] [place], [place] [address]  Iron and TIBC     Status: None   Collection Time: 12/06/19  1:50 PM  Result Value Ref Range   Iron 37 28 - 170 ug/dL   TIBC 265 250 - 450 ug/dL   Saturation Ratios 14 10.4 - 31.8 %   UIBC 228 ug/dL    Comment: Performed at [facility], [address], [place], [place] [address]  Basic Metabolic Panel (BMET)     Status: Abnormal   Collection Time: 12/07/19 10:48 AM  Result Value Ref Range   Glucose 100 (H) 65 - 99 mg/dL   BUN 14 6 - 24 mg/dL   Creatinine, Ser 1.84 (H) 0.57 - 1.00 mg/dL   GFR calc non Af Amer 31 (L) >59 mL/min/1.73   GFR calc Af Amer 36 (L) >59 mL/min/1.73    Comment: **In accordance with recommendations from the NKF-ASN Task force,**   [facility] is in the process of updating its eGFR calculation to the   2021 CKD-EPI creatinine equation that estimates kidney function   without a race variable.    BUN/Creatinine Ratio 8 (L) 9 - 23   Sodium 141 134 - 144 mmol/L   Potassium 3.8 3.5 - 5.2 mmol/L   Chloride 107 (H) 96 - 106 mmol/L   CO2 23 20 - 29 mmol/L   Calcium 8.8 8.7 - 10.2 mg/dL   CBC w/Diff     Status: None   Collection Time: 12/07/19 10:48 AM  Result Value Ref Range   WBC 6.6 3.4 - 10.8 x10E3/uL   RBC 4.31 3.77 - 5.28 x10E6/uL   Hemoglobin 11.6 11.1 - 15.9 g/dL   Hematocrit 35.5 34.0 - 46.6 %   MCV 82 79 - 97 fL   MCH 26.9 26.6 - 33.0 pg   MCHC 32.7 31 - 35 g/dL   RDW 13.0 11.7 - 15.4 %   Platelets 188 150 - 450 x10E3/uL   Neutrophils 69 Not Estab. %   Lymphs 20 Not Estab. %   Monocytes 5 Not Estab. %   Eos 4 Not Estab. %   Basos 2 Not Estab. %   Neutrophils Absolute 4.6 1.40 - 7.00 x10E3/uL   Lymphocytes Absolute 1.3 0 - 3 x10E3/uL   Monocytes Absolute 0.4 0 - 0 x10E3/uL   EOS (ABSOLUTE) 0.3 0.0 - 0.4 x10E3/uL   Basophils Absolute 0.1 0 - 0 x10E3/uL  Immature Granulocytes 0 Not Estab. %   Immature Grans (Abs) 0.0 0.0 - 0.1 x10E3/uL  SARS CORONAVIRUS 2 (TAT 6-24 HRS) Nasopharyngeal Nasopharyngeal Swab     Status: None   Collection Time: 12/11/19 11:26 AM   Specimen: Nasopharyngeal Swab  Result Value Ref Range   SARS Coronavirus 2 NEGATIVE NEGATIVE    Comment: (NOTE) SARS-CoV-2 target nucleic acids are NOT DETECTED.  The SARS-CoV-2 RNA is generally detectable in upper and lower respiratory specimens during the acute phase of infection. Negative results do not preclude SARS-CoV-2 infection, do not rule out co-infections with other pathogens, and should not be used as the sole basis for treatment or other patient management decisions. Negative results must be combined with clinical observations, patient history, and epidemiological information. The expected result is Negative.  Fact Sheet for Patients: SugarRoll.be  Fact Sheet for Healthcare Providers: https://www.woods-mathews.com/  This test is not yet approved or cleared by the [place] FDA and  has been authorized for detection and/or diagnosis of SARS-CoV-2 by FDA under an Emergency Use Authorization (EUA). This EUA will remain  in effect  (meaning this test can be used) for the duration of the COVID-19 declaration under Se ction 564(b)(1) of the Act, 21 U.S.C. section 360bbb-3(b)(1), unless the authorization is terminated or revoked sooner.  Performed at [facility], [place] [address], [place], [place] [address]   Basic metabolic panel     Status: Abnormal   Collection Time: 12/12/19 11:54 AM  Result Value Ref Range   Sodium 140 135 - 145 mmol/L   Potassium 3.7 3.5 - 5.1 mmol/L   Chloride 107 98 - 111 mmol/L   CO2 23 22 - 32 mmol/L   Glucose, Bld 123 (H) 70 - 99 mg/dL    Comment: Glucose reference range applies only to samples taken after fasting for at least 8 hours.   BUN 19 6 - 20 mg/dL   Creatinine, Ser 2.02 (H) 0.44 - 1.00 mg/dL   Calcium 9.2 8.9 - 10.3 mg/dL   GFR, Estimated 29 (L) >60 mL/min    Comment: (NOTE) Calculated using the CKD-EPI Creatinine Equation (2021)    Anion gap 10 5 - 15    Comment: Performed at [facility] [address], [place] [address]  CBC     Status: None   Collection Time: 12/12/19 11:54 AM  Result Value Ref Range   WBC 6.0 4.0 - 10.5 K/uL   RBC 4.56 3.87 - 5.11 MIL/uL   Hemoglobin 12.0 12.0 - 15.0 g/dL   HCT 38.7 36 - 46 %   MCV 84.9 80.0 - 100.0 fL   MCH 26.3 26.0 - 34.0 pg   MCHC 31.0 30.0 - 36.0 g/dL   RDW 13.2 11.5 - 15.5 %   Platelets 183 150 - 400 K/uL   nRBC 0.0 0.0 - 0.2 %    Comment: Performed at [facility], [place] [address], [place], [place] [address]  Troponin I (High Sensitivity)     Status: None   Collection Time: 12/12/19 11:54 AM  Result Value Ref Range   Troponin I (High Sensitivity) 9 <18 ng/L    Comment: (NOTE) Elevated high sensitivity troponin I (hsTnI) values and significant  changes across serial measurements may suggest ACS but many other  chronic and acute conditions are known to elevate hsTnI results.  Refer to the "Links" section for chest pain algorithms and additional  guidance. Performed at [facility], [place]  [address] [place], [place] [address]   I-Stat beta hCG blood, ED     Status: Abnormal   Collection Time: 12/12/19 12:06 PM  Result Value Ref Range   I-stat hCG, quantitative 5.3 (H) <5 mIU/mL   Comment 3            Comment:   GEST. AGE      CONC.  (mIU/mL)   <=1 WEEK        5 - 50     2 WEEKS       50 - 500     3 WEEKS       100 - 10,000     4 WEEKS     1,000 - 30,000        FEMALE AND NON-PREGNANT FEMALE:     LESS THAN 5 mIU/mL   Troponin I (High Sensitivity)     Status: None   Collection Time: 12/12/19  1:46 PM  Result Value Ref Range   Troponin I (High Sensitivity) 14 <18 ng/L    Comment: (NOTE) Elevated high sensitivity troponin I (hsTnI) values and significant  changes across serial measurements may suggest ACS but many other  chronic and acute conditions are known to elevate hsTnI results.  Refer to the "Links" section for chest pain algorithms and additional  guidance. Performed at [facility], [place] [address] [place], [place] [address]   MRSA PCR Screening     Status: Abnormal   Collection Time: 12/12/19  8:48 PM   Specimen: Nasopharyngeal  Result Value Ref Range   MRSA by PCR POSITIVE (A) NEGATIVE    Comment:        The GeneXpert MRSA Assay (FDA approved for NASAL specimens only), is one component of a comprehensive MRSA colonization surveillance program. It is not intended to diagnose MRSA infection nor to guide or monitor treatment for MRSA infections. RESULT CALLED TO, READ BACK BY AND VERIFIED WITH: Dellie Burns RN 12/12/19 AT 2258 SK Performed at [facility], [address] [place], [place] [address]   Basic metabolic panel     Status: Abnormal   Collection Time: 12/13/19 12:36 AM  Result Value Ref Range   Sodium 141 135 - 145 mmol/L   Potassium 3.8 3.5 - 5.1 mmol/L   Chloride 109 98 - 111 mmol/L   CO2 24 22 - 32 mmol/L   Glucose, Bld 98 70 - 99 mg/dL    Comment: Glucose reference range applies only to samples taken after fasting  for at least 8 hours.   BUN 17 6 - 20 mg/dL   Creatinine, Ser 1.81 (H) 0.44 - 1.00 mg/dL   Calcium 8.9 8.9 - 10.3 mg/dL   GFR, Estimated 33 (L) >60 mL/min    Comment: (NOTE) Calculated using the CKD-EPI Creatinine Equation (2021)    Anion gap 8 5 - 15    Comment: Performed at [facility] [address] [place], [place] [address]  CBC     Status: Abnormal   Collection Time: 12/13/19 12:36 AM  Result Value Ref Range   WBC 6.0 4.0 - 10.5 K/uL   RBC 3.90 3.87 - 5.11 MIL/uL   Hemoglobin 10.3 (L) 12.0 - 15.0 g/dL   HCT 32.4 (L) 36 - 46 %   MCV 83.1 80.0 - 100.0 fL   MCH 26.4 26.0 - 34.0 pg   MCHC 31.8 30.0 - 36.0 g/dL   RDW 13.2 11.5 - 15.5 %   Platelets 147 (L)  150 - 400 K/uL   nRBC 0.0 0.0 - 0.2 %    Comment: Performed at [facility], [place] [address], [place], [place] [address]  Pregnancy, urine     Status: None   Collection Time: 12/13/19  7:50 AM  Result Value Ref Range   Preg Test, Ur NEGATIVE NEGATIVE    Comment:        THE SENSITIVITY OF THIS METHODOLOGY IS >20 mIU/mL. Performed at [facility], [place] [address], [place], [place] [address]   Glucose, capillary     Status: Abnormal   Collection Time: 12/13/19 11:31 AM  Result Value Ref Range   Glucose-Capillary 58 (L) 70 - 99 mg/dL    Comment: Glucose reference range applies only to samples taken after fasting for at least 8 hours.   Comment 1 Notify RN   POCT Activated clotting time     Status: None   Collection Time: 12/13/19  3:21 PM  Result Value Ref Range   Activated Clotting Time 505 seconds  Glucose, capillary     Status: Abnormal   Collection Time: 12/13/19  9:39 PM  Result Value Ref Range   Glucose-Capillary 152 (H) 70 - 99 mg/dL    Comment: Glucose reference range applies only to samples taken after fasting for at least 8 hours.   Comment 1 Notify RN    Comment 2 Document in Chart   Basic metabolic panel     Status: Abnormal   Collection Time: 12/14/19  1:00 AM  Result Value Ref Range    Sodium 141 135 - 145 mmol/L   Potassium 4.2 3.5 - 5.1 mmol/L   Chloride 111 98 - 111 mmol/L   CO2 25 22 - 32 mmol/L   Glucose, Bld 119 (H) 70 - 99 mg/dL    Comment: Glucose reference range applies only to samples taken after fasting for at least 8 hours.   BUN 18 6 - 20 mg/dL   Creatinine, Ser 2.04 (H) 0.44 - 1.00 mg/dL   Calcium 8.6 (L) 8.9 - 10.3 mg/dL   GFR, Estimated 29 (L) >60 mL/min    Comment: (NOTE) Calculated using the CKD-EPI Creatinine Equation (2021)    Anion gap 5 5 - 15    Comment: Performed at [facility] [address], [place], [place] [address]  CBC     Status: Abnormal   Collection Time: 12/14/19  1:00 AM  Result Value Ref Range   WBC 5.7 4.0 - 10.5 K/uL   RBC 3.72 (L) 3.87 - 5.11 MIL/uL   Hemoglobin 10.1 (L) 12.0 - 15.0 g/dL   HCT 31.7 (L) 36 - 46 %   MCV 85.2 80.0 - 100.0 fL   MCH 27.2 26.0 - 34.0 pg   MCHC 31.9 30.0 - 36.0 g/dL   RDW 13.2 11.5 - 15.5 %   Platelets 131 (L) 150 - 400 K/uL   nRBC 0.0 0.0 - 0.2 %    Comment: Performed at [facility], [place] [address], [place], [place] [address]  Glucose, capillary     Status: None   Collection Time: 12/14/19  6:39 AM  Result Value Ref Range   Glucose-Capillary 81 70 - 99 mg/dL    Comment: Glucose reference range applies only to samples taken after fasting for at least 8 hours.   Comment 1 Notify RN    Comment 2 Document in Chart   Basic metabolic panel     Status: Abnormal   Collection  Time: 12/22/19 11:26 AM  Result Value Ref Range   Glucose 126 (H) 65 - 99 mg/dL   BUN 23 6 - 24 mg/dL   Creatinine, Ser 1.86 (H) 0.57 - 1.00 mg/dL   GFR calc non Af Amer 31 (L) >59 mL/min/1.73   GFR calc Af Amer 36 (L) >59 mL/min/1.73    Comment: **In accordance with recommendations from the NKF-ASN Task force,**   [facility] is in the process of updating its eGFR calculation to the   2021 CKD-EPI creatinine equation that estimates kidney function   without a race variable.    BUN/Creatinine Ratio 12 9 -  23   Sodium 142 134 - 144 mmol/L   Potassium 4.7 3.5 - 5.2 mmol/L   Chloride 106 96 - 106 mmol/L   CO2 21 20 - 29 mmol/L   Calcium 8.6 (L) 8.7 - 10.2 mg/dL     Assessment/Plan:  Pain in limb ABIs today were done which were noncompressible from medial calcification, but her waveforms were triphasic in her digital pressures and waveforms were normal.  This would indicate she does have some peripheral arterial disease, but her flow is currently preserved and normal and this would not be the cause of her current pain.   Suspect neuropathic pain at this point.  Defer further management to primary care provider and podiatrist.  Hyperlipidemia lipid control important in reducing the progression of atherosclerotic disease. Continue statin therapy   Type II diabetes mellitus with renal manifestations (HCC) blood glucose control important in reducing the progression of atherosclerotic disease. Also, involved in wound healing. On appropriate medications.   Peripheral vascular disease, unspecified ([place]) ABIs today were done which were noncompressible from medial calcification, but her waveforms were triphasic in her digital pressures and waveforms were normal.  This would indicate she does have some peripheral arterial disease, but her flow is currently preserved and normal and this would not be the cause of her current pain.  No intervention is currently warranted.  On appropriate medications.  This can be rechecked in 2 years.      Leotis Pain 01/05/2020, 9:41 AM   This note was created with Dragon medical transcription system.  Any errors from dictation are unintentional.

## 2020-01-05 NOTE — Assessment & Plan Note (Signed)
lipid control important in reducing the progression of atherosclerotic disease. Continue statin therapy  

## 2020-01-05 NOTE — Assessment & Plan Note (Signed)
blood glucose control important in reducing the progression of atherosclerotic disease. Also, involved in wound healing. On appropriate medications.  

## 2020-01-05 NOTE — Telephone Encounter (Signed)
Left vm for pt to return my call to schedule an appt on Dec 15th at 8:30am.

## 2020-01-05 NOTE — Assessment & Plan Note (Signed)
ABIs today were done which were noncompressible from medial calcification, but her waveforms were triphasic in her digital pressures and waveforms were normal.  This would indicate she does have some peripheral arterial disease, but her flow is currently preserved and normal and this would not be the cause of her current pain.   Suspect neuropathic pain at this point.  Defer further management to primary care provider and podiatrist.

## 2020-01-05 NOTE — Patient Instructions (Signed)
Peripheral Vascular Disease Peripheral vascular disease (PVD) is a disease of the blood vessels. A simple term for PVD is poor circulation. In most cases, PVD narrows the blood vessels that carry blood from your heart to the rest of your body. This can result in a decreased supply of blood to your arms, legs, and internal organs, like your stomach or kidneys. However, it most often affects a person's lower legs and feet. There are two types of PVD.  Organic PVD. This is the more common type. It is caused by damage to the structure of blood vessels.  Functional PVD. This is caused by conditions that make blood vessels contract and tighten (spasm). Without treatment, PVD tends to get worse over time. PVD can also lead to acute limb ischemia. This is when an arm or leg suddenly has trouble getting enough blood. This is a medical emergency. What are the causes?  Each type of PVD has many different causes. The most common cause of PVD is buildup of a fatty material (plaque) inside your arteries (atherosclerosis). Small amounts of plaque can break off from the walls of the blood vessels and become lodged in a smaller artery. This blocks blood flow and can cause acute limb ischemia. Other common causes of PVD include:  Blood clots that form inside of blood vessels.  Injuries to blood vessels.  Diseases that cause inflammation of blood vessels or cause blood vessel spasms.  Health behaviors and health history that increase your risk of developing PVD. What increases the risk? You are more likely to develop this condition if:  You have a family history of PVD.  You have certain medical conditions, including: ? High cholesterol. ? Diabetes. ? High blood pressure (hypertension). ? Coronary heart disease. ? Past problems with blood clots. ? Past injury, such as burns or a broken bone. These may have damaged blood vessels in your limbs. ? Buerger disease. This is caused by inflamed blood vessels  in your hands and feet. ? Some forms of arthritis. ? Rare birth defects that affect the arteries in your legs. ? Kidney disease.  You use tobacco or smoke.  You do not get enough exercise.  You are obese.  You are age 50 or older. What are the signs or symptoms? This condition may cause different symptoms. Your symptoms depend on what part of your body is not getting enough blood. Some common signs and symptoms include:  Cramps in your lower legs. This may be a symptom of poor leg circulation (claudication).  Pain and weakness in your legs. This happens while you are physically active but goes away when you rest (intermittent claudication).  Leg pain when at rest.  Leg numbness, tingling, or weakness.  Coldness in a leg or foot, especially when compared with the other leg.  Skin or hair changes. These can include: ? Hair loss. ? Shiny skin. ? Pale or bluish skin. ? Thick toenails.  Inability to get or maintain an erection (erectile dysfunction).  Fatigue. People with PVD are more likely to develop ulcers and sores on their toes, feet, or legs. These may take longer than normal to heal. How is this diagnosed? This condition is diagnosed based on:  Your signs and symptoms.  A physical exam and your medical history.  Other tests to find out what is causing your PVD and to determine its severity. Tests may include: ? Blood pressure recordings from your arms and legs and measurements of the strength of your pulses (pulse volume   recordings). ? Imaging studies using sound waves to take pictures of the blood flow through your blood vessels (Doppler ultrasound). ? Injecting a dye into your blood vessels before having imaging studies using:  X-rays (angiogram or arteriogram).  Computer-generated X-rays (CT angiogram).  A powerful electromagnetic field and a computer (magnetic resonance angiogram or MRA). How is this treated? Treatment for PVD depends on the cause of your  condition and how severe your symptoms are. It also depends on your age. Underlying causes need to be treated and controlled. These include long-term (chronic) conditions, such as diabetes, high cholesterol, and high blood pressure. Treatment includes:  Lifestyle changes, such as: ? Quitting smoking. ? Exercising regularly. ? Following a low-fat, low-cholesterol diet.  Taking medicines, such as: ? Blood thinners to prevent blood clots. ? Medicines to improve blood flow. ? Medicines to improve your blood cholesterol levels.  Surgical procedures, such as: ? A procedure that uses an inflated balloon to open a blocked artery and improve blood flow (angioplasty). ? A procedure to put in a wire mesh tube to keep a blocked artery open (stent implant). ? Surgery to reroute blood flow around a blocked artery (peripheral bypass surgery). ? Surgery to remove dead tissue from an infected wound on the affected limb. ? Amputation. This is surgical removal of the affected limb. It may be necessary in cases of acute limb ischemia where there has been no improvement through medical or surgical treatments. Follow these instructions at home: Lifestyle  Do not use any products that contain nicotine or tobacco, such as cigarettes and e-cigarettes. If you need help quitting, ask your health care provider.  Lose weight if you are overweight, and maintain a healthy weight as discussed by your health care provider.  Eat a diet that is low in fat and cholesterol. If you need help, ask your health care provider.  Exercise regularly. Ask your health care provider to suggest some good activities for you. General instructions  Take over-the-counter and prescription medicines only as told by your health care provider.  Take good care of your feet: ? Wear comfortable shoes that fit well. ? Check your feet often for any cuts or sores.  Keep all follow-up visits as told by your health care provider. This is  important. Contact a health care provider if:  You have cramps in your legs while walking.  You have leg pain when you are at rest.  You have coldness in a leg or foot.  Your skin changes.  You have erectile dysfunction.  You have cuts or sores on your feet that are not healing. Get help right away if:  Your arm or leg turns cold, numb, and blue.  Your arms or legs become red, warm, swollen, painful, or numb.  You have chest pain or trouble breathing.  You suddenly have weakness in your face, arm, or leg.  You become very confused or lose the ability to speak.  You suddenly have a very bad headache or lose your vision. Summary  Peripheral vascular disease (PVD) is a disease of the blood vessels.  In most cases, PVD narrows the blood vessels that carry blood from your heart to the rest of your body.  PVD may cause different symptoms. Your symptoms depend on what part of your body is not getting enough blood.  Treatment for PVD depends on the cause of your condition and how severe your symptoms are. This information is not intended to replace advice given to you by your   health care provider. Make sure you discuss any questions you have with your health care provider. Document Revised: 01/01/2017 Document Reviewed: 02/27/2016 Elsevier Patient Education  2020 Elsevier Inc.  

## 2020-01-05 NOTE — Assessment & Plan Note (Signed)
ABIs today were done which were noncompressible from medial calcification, but her waveforms were triphasic in her digital pressures and waveforms were normal.  This would indicate she does have some peripheral arterial disease, but her flow is currently preserved and normal and this would not be the cause of her current pain.  No intervention is currently warranted.  On appropriate medications.  This can be rechecked in 2 years.

## 2020-01-08 ENCOUNTER — Telehealth: Payer: Self-pay | Admitting: Gastroenterology

## 2020-01-08 ENCOUNTER — Encounter: Payer: Self-pay | Admitting: Family Medicine

## 2020-01-08 NOTE — Telephone Encounter (Signed)
Pt returned call and has been scheduled for Dec 15th at 8:30am in [place].

## 2020-01-09 ENCOUNTER — Encounter: Payer: Self-pay | Admitting: Family Medicine

## 2020-01-09 ENCOUNTER — Other Ambulatory Visit (HOSPITAL_COMMUNITY)
Admission: RE | Admit: 2020-01-09 | Discharge: 2020-01-09 | Disposition: A | Discharge status: Home / Self Care | Payer: Medicare Other | Source: Ambulatory Visit | Attending: Family Medicine | Admitting: Family Medicine

## 2020-01-09 ENCOUNTER — Other Ambulatory Visit: Payer: Self-pay

## 2020-01-09 ENCOUNTER — Ambulatory Visit (INDEPENDENT_AMBULATORY_CARE_PROVIDER_SITE_OTHER): Payer: Self-pay | Admitting: Family Medicine

## 2020-01-09 VITALS — BP 130/80 | HR 67 | Temp 98.2°F | Resp 16 | Wt 158.0 lb

## 2020-01-09 DIAGNOSIS — R300 Dysuria: Secondary | ICD-10-CM

## 2020-01-09 DIAGNOSIS — I25118 Atherosclerotic heart disease of native coronary artery with other forms of angina pectoris: Secondary | ICD-10-CM

## 2020-01-09 DIAGNOSIS — R0600 Dyspnea, unspecified: Secondary | ICD-10-CM

## 2020-01-09 DIAGNOSIS — N3000 Acute cystitis without hematuria: Secondary | ICD-10-CM

## 2020-01-09 DIAGNOSIS — R0609 Other forms of dyspnea: Secondary | ICD-10-CM

## 2020-01-09 LAB — POCT URINALYSIS DIPSTICK
Bilirubin, UA: NEGATIVE
Glucose, UA: NEGATIVE
Ketones, UA: NEGATIVE
Nitrite, UA: POSITIVE
Protein, UA: NEGATIVE
Spec Grav, UA: 1.01 (ref 1.010–1.025)
Urobilinogen, UA: 0.2 E.U./dL
pH, UA: 5 (ref 5.0–8.0)

## 2020-01-09 NOTE — Progress Notes (Signed)
Established patient visit   Patient: [name]   DOB: 1971/01/19   49 y.o. Female  MRN: [id number] Visit Date: 01/09/2020  Today's healthcare provider: [name], MD   Chief Complaint  Patient presents with  . Follow-up  . Exposure to STD   Subjective    HPI  Follow up for Acute pulmonary embolism without acute cor pulmonale, unspecified pulmonary embolism type   The patient was last seen for this on 11/28/2019. Changes made at last visit include continue Eliquis but reduce dose to - apixaban (ELIQUIS) 2.5 MG TABS tablet; Take 1 tablet (2.5 mg total) by mouth 2 (two) times daily. Since last visit, patient Eliquis was discontinued by Dr. Fletcher Anon due to PE not being seen on CTA and anticipation of cardiac stenting.   Since then she had cardiac stent placed on 12/13/2019 and chest pains have greatly improved she did have to take nitroglycerine once which was effective. She is currently in cardiac rehab and doing well, but states she gets short of breath easily with exertion. She was noted to be mildly anemic at the time of cardiac procedure in November.   -----------------------------------------------------------------------------------------  Possible STD exposure: Patient is here today for STD check. She states her new boyfriend has had genital itching, diarrhea and burning during urination. Patient is concerned that he may have an infection. Patient denies any urinary or genital symptoms.       Medications: Outpatient Medications Prior to Visit  Medication Sig  . ALPRAZolam (XANAX) 1 MG tablet Take 1 tablet by mouth twice daily as needed (Patient taking differently: Take 1 mg by mouth 2 (two) times daily as needed for anxiety. )  . amLODipine (NORVASC) 2.5 MG tablet Take 1 tablet (2.5 mg total) by mouth daily. (Patient taking differently: Take 2.5 mg by mouth every evening. )  . aspirin EC 81 MG tablet Take 1 tablet (81 mg total) by mouth daily. Swallow whole.   . Biotin w/ Vitamins C & E (HAIR SKIN & NAILS GUMMIES PO) Take 4 tablets by mouth daily. (Patient not taking: Reported on 12/26/2019)  . buPROPion (WELLBUTRIN SR) 200 MG 12 hr tablet Take 200 mg by mouth every morning.  . carvedilol (COREG) 12.5 MG tablet Take 1 tablet (12.5 mg total) by mouth 2 (two) times daily with a meal.  . clopidogrel (PLAVIX) 75 MG tablet Take 1 tablet (75 mg total) by mouth daily.  Marland Kitchen desvenlafaxine (PRISTIQ) 50 MG 24 hr tablet Take 50 mg by mouth daily.  Arna Medici 25 MCG tablet TAKE 1 TABLET BY MOUTH ONCE DAILY BEFORE BREAKFAST (Patient not taking: Reported on 01/05/2020)  . FIBER COMPLETE PO Take 3 capsules by mouth daily.  (Patient not taking: Reported on 01/05/2020)  . gabapentin (NEURONTIN) 300 MG capsule Take 1 capsule (300 mg total) by mouth 2 (two) times daily. (Patient taking differently: Take 600 mg by mouth 2 (two) times daily. )  . isosorbide mononitrate (IMDUR) 120 MG 24 hr tablet Take 1 tablet (120 mg total) by mouth daily.  Marland Kitchen levothyroxine (SYNTHROID) 25 MCG tablet Take 25 mcg by mouth daily before breakfast.  . Multiple Vitamin (MULTIVITAMIN) capsule Take 1 capsule by mouth daily. gummies (Patient not taking: Reported on 01/05/2020)  . nitroGLYCERIN (NITROSTAT) 0.4 MG SL tablet Place 1 tablet (0.4 mg total) under the tongue every 5 (five) minutes as needed for chest pain.  . pantoprazole (PROTONIX) 40 MG tablet Take 1 tablet by mouth twice daily (Patient taking differently:  Take 40 mg by mouth 2 (two) times daily. )  . Polyethyl Glycol-Propyl Glycol (SYSTANE OP) Place 1 drop into both eyes daily as needed (dry eyes).  . promethazine (PHENERGAN) 25 MG tablet Take 25 mg by mouth every 6 (six) hours as needed for nausea or vomiting.   . rosuvastatin (CRESTOR) 40 MG tablet Take 1 tablet (40 mg total) by mouth daily at 6 PM.  . traZODone (DESYREL) 100 MG tablet TAKE 1 TO 2 TABLETS BY MOUTH AT BEDTIME (Patient taking differently: Take 100-200 mg by mouth at bedtime  as needed for sleep. )  . UBRELVY 100 MG TABS Take 100 mg by mouth 2 (two) times daily as needed (migraine).   . venlafaxine (EFFEXOR) 75 MG tablet Take 150 mg by mouth daily.   No facility-administered medications prior to visit.    Review of Systems  Constitutional: Negative for appetite change, chills, fatigue and fever.  Respiratory: Negative for chest tightness and shortness of breath.   Cardiovascular: Negative for chest pain and palpitations.  Gastrointestinal: Negative for abdominal pain, nausea and vomiting.  Neurological: Negative for dizziness and weakness.      Objective    BP 130/80   Pulse 67   Temp 98.2 F (36.8 C) (Oral)   Resp 16   Wt 158 lb (71.7 kg)   BMI 27.99 kg/m    Physical Exam   General: Appearance:     Overweight female in no acute distress  Eyes:    PERRL, conjunctiva/corneas clear, EOM's intact       Lungs:     Clear to auscultation bilaterally, respirations unlabored  Heart:    Normal heart rate. Normal rhythm. No murmurs, rubs, or gallops.   MS:   All extremities are intact.   Neurologic:   Awake, alert, oriented x 3. No apparent focal neurological           defect.        No results found for any visits on 01/09/20.    Assessment & Plan     1. Dyspnea on exertion Chronic, not improved since stenting. Was mildly anemic at time of cardiac procedure last month.  - CBC - Brain natriuretic peptide  2. STD exposure  - Urine cytology ancillary only  3. Acute cystitis without hematuria Currently asymptomatic - Urine Culture  4. CAD On DUPT. Off NOAC since no PE seen on most recent CTA      The entirety of the information documented in the History of Present Illness, Review of Systems and Physical Exam were personally obtained by me. Portions of this information were initially documented by the CMA and reviewed by me for thoroughness and accuracy.      [name], MD  [facility] [phone]  (phone) [phone] (fax)  [place]

## 2020-01-10 DIAGNOSIS — M216X1 Other acquired deformities of right foot: Secondary | ICD-10-CM | POA: Diagnosis not present

## 2020-01-10 DIAGNOSIS — B351 Tinea unguium: Secondary | ICD-10-CM | POA: Diagnosis not present

## 2020-01-10 DIAGNOSIS — I739 Peripheral vascular disease, unspecified: Secondary | ICD-10-CM | POA: Diagnosis not present

## 2020-01-10 DIAGNOSIS — M79671 Pain in right foot: Secondary | ICD-10-CM | POA: Diagnosis not present

## 2020-01-10 DIAGNOSIS — M84374A Stress fracture, right foot, initial encounter for fracture: Secondary | ICD-10-CM | POA: Diagnosis not present

## 2020-01-10 DIAGNOSIS — Q66221 Congenital metatarsus adductus, right foot: Secondary | ICD-10-CM | POA: Diagnosis not present

## 2020-01-10 DIAGNOSIS — M7751 Other enthesopathy of right foot: Secondary | ICD-10-CM | POA: Diagnosis not present

## 2020-01-10 DIAGNOSIS — M2041 Other hammer toe(s) (acquired), right foot: Secondary | ICD-10-CM | POA: Diagnosis not present

## 2020-01-10 DIAGNOSIS — M216X2 Other acquired deformities of left foot: Secondary | ICD-10-CM | POA: Diagnosis not present

## 2020-01-10 DIAGNOSIS — M205X1 Other deformities of toe(s) (acquired), right foot: Secondary | ICD-10-CM | POA: Diagnosis not present

## 2020-01-10 DIAGNOSIS — E118 Type 2 diabetes mellitus with unspecified complications: Secondary | ICD-10-CM | POA: Diagnosis not present

## 2020-01-10 DIAGNOSIS — M21621 Bunionette of right foot: Secondary | ICD-10-CM | POA: Diagnosis not present

## 2020-01-10 DIAGNOSIS — M79674 Pain in right toe(s): Secondary | ICD-10-CM | POA: Diagnosis not present

## 2020-01-10 LAB — CBC
Hematocrit: 36.2 % (ref 34.0–46.6)
Hemoglobin: 11.8 g/dL (ref 11.1–15.9)
MCH: 27.4 pg (ref 26.6–33.0)
MCHC: 32.6 g/dL (ref 31.5–35.7)
MCV: 84 fL (ref 79–97)
Platelets: 177 10*3/uL (ref 150–450)
RBC: 4.31 x10E6/uL (ref 3.77–5.28)
RDW: 13 % (ref 11.7–15.4)
WBC: 7.1 10*3/uL (ref 3.4–10.8)

## 2020-01-10 LAB — BRAIN NATRIURETIC PEPTIDE: BNP: 64.6 pg/mL (ref 0.0–100.0)

## 2020-01-11 ENCOUNTER — Other Ambulatory Visit: Payer: Self-pay

## 2020-01-11 ENCOUNTER — Telehealth: Payer: Self-pay

## 2020-01-11 DIAGNOSIS — F331 Major depressive disorder, recurrent, moderate: Secondary | ICD-10-CM

## 2020-01-11 DIAGNOSIS — Z955 Presence of coronary angioplasty implant and graft: Secondary | ICD-10-CM | POA: Diagnosis not present

## 2020-01-11 DIAGNOSIS — Z79899 Other long term (current) drug therapy: Secondary | ICD-10-CM | POA: Diagnosis not present

## 2020-01-11 LAB — GLUCOSE, CAPILLARY
Glucose-Capillary: 79 mg/dL (ref 70–99)
Glucose-Capillary: 83 mg/dL (ref 70–99)

## 2020-01-11 NOTE — Telephone Encounter (Signed)
Patient is requesting a psychology referral. She says she currently sees a psychiatrist that prescribes medication for depression and anxiety, but she also needs someone to talk to. Patient says that her psychiatrist does talk to her as much as she would like. Please advise on psychology referral.

## 2020-01-11 NOTE — Progress Notes (Signed)
Daily Session Note  Patient Details  Name: [name] MRN: [id number] Date of Birth: 01-19-1971 Referring Provider:   Flowsheet Row Cardiac Rehab from 01/04/2020 in [facility] Cardiac [facility] Pul[facility] Rehab  Referring Provider Kathlyn Sacramento MD      Encounter Date: 01/11/2020  Check In:  Session Check In - 01/11/20 0749      Check-In   Supervising physician immediately available to respond to emergencies See telemetry face sheet for immediately available ER MD    Location [facility]-Cardiac & Pulmonary Rehab    Staff Present [name], MPA, RN;Melissa Caiola RDN, Rowe Pavy, BA, ACSM CEP, Exercise Physiologist    Virtual Visit No    Medication changes reported     No    Fall or balance concerns reported    No    Warm-up and Cool-down Performed on first and last piece of equipment    Resistance Training Performed Yes    VAD Patient? No    PAD/SET Patient? No      Pain Assessment   Currently in Pain? No/denies              Social History   Tobacco Use  Smoking Status Former Smoker  . Packs/day: 0.25  . Years: 1.00  . Pack years: 0.25  . Types: Cigarettes  . Quit date: 08/31/1994  . Years since quitting: 25.3  Smokeless Tobacco Never Used  Tobacco Comment   quit 28 years ago    Goals Met:  Independence with exercise equipment Exercise tolerated well No report of cardiac concerns or symptoms Strength training completed today  Goals Unmet:  Not Applicable  Comments: First full day of exercise!  Patient was oriented to gym and equipment including functions, settings, policies, and procedures.  Patient's individual exercise prescription and treatment plan were reviewed.  All starting workloads were established based on the results of the 6 minute walk test done at initial orientation visit.  The plan for exercise progression was also introduced and progression will be customized based on patient's performance and goals.    Dr. Emily Filbert is Medical  Director for [place] and [facility].

## 2020-01-14 LAB — URINE CULTURE

## 2020-01-15 ENCOUNTER — Other Ambulatory Visit: Payer: Self-pay | Admitting: Family Medicine

## 2020-01-15 DIAGNOSIS — N3000 Acute cystitis without hematuria: Secondary | ICD-10-CM

## 2020-01-15 MED ORDER — DOXYCYCLINE HYCLATE 100 MG PO TABS: 100.0000 mg | ORAL_TABLET | Freq: Two times a day (BID) | ORAL | 0 refills | Status: AC

## 2020-01-16 ENCOUNTER — Encounter: Payer: Self-pay | Admitting: Family Medicine

## 2020-01-16 LAB — URINE CYTOLOGY ANCILLARY ONLY
Bacterial Vaginitis-Urine: NEGATIVE
Candida Urine: NEGATIVE
Chlamydia: NEGATIVE
Comment: NEGATIVE
Comment: NEGATIVE
Comment: NORMAL
Neisseria Gonorrhea: NEGATIVE
Trichomonas: NEGATIVE

## 2020-01-17 ENCOUNTER — Ambulatory Visit: Payer: BC Managed Care – PPO | Admitting: Gastroenterology

## 2020-01-17 ENCOUNTER — Other Ambulatory Visit: Payer: Self-pay

## 2020-01-17 DIAGNOSIS — G629 Polyneuropathy, unspecified: Secondary | ICD-10-CM

## 2020-01-17 MED ORDER — GABAPENTIN 300 MG PO CAPS: 600.0000 mg | ORAL_CAPSULE | Freq: Two times a day (BID) | ORAL | 4 refills | Status: DC

## 2020-01-17 NOTE — Telephone Encounter (Signed)
Patient sent mychart message requesting refill. She says she has been out of 3 days. She says you increased the dose to 600mg  twice daily.  Please advise on refill request.

## 2020-01-18 ENCOUNTER — Encounter: Payer: Self-pay | Admitting: Family Medicine

## 2020-01-18 ENCOUNTER — Telehealth: Payer: Self-pay

## 2020-01-18 ENCOUNTER — Emergency Department
Admission: EM | Admit: 2020-01-18 | Discharge: 2020-01-18 | Discharge status: Against Medical Advice | Payer: Medicare Other

## 2020-01-18 NOTE — ED Notes (Signed)
Pt called in the [facility] with no response, pt is not visualized at this time

## 2020-01-18 NOTE — Telephone Encounter (Signed)
Called [name] to let her know that we have cancelled classes for this morning - she is scheduled for 745 am. Let her know our classes will be running as normal next week.

## 2020-01-18 NOTE — ED Notes (Signed)
Pt called in the [facility] with no response 

## 2020-01-23 ENCOUNTER — Other Ambulatory Visit: Payer: Self-pay

## 2020-01-23 ENCOUNTER — Encounter: Payer: Medicare Other | Admitting: *Deleted

## 2020-01-23 DIAGNOSIS — Z955 Presence of coronary angioplasty implant and graft: Secondary | ICD-10-CM | POA: Diagnosis not present

## 2020-01-23 DIAGNOSIS — Z79899 Other long term (current) drug therapy: Secondary | ICD-10-CM | POA: Diagnosis not present

## 2020-01-23 LAB — GLUCOSE, CAPILLARY
Glucose-Capillary: 128 mg/dL — ABNORMAL HIGH (ref 70–99)
Glucose-Capillary: 88 mg/dL (ref 70–99)

## 2020-01-23 NOTE — Progress Notes (Signed)
Daily Session Note  Patient Details  Name: [name] MRN: [id number] Date of Birth: Nov 27, 1970 Referring Provider:   Flowsheet Row Cardiac Rehab from 01/04/2020 in [facility] Cardiac and Pulmonary Rehab  Referring Provider Kathlyn Sacramento MD      Encounter Date: 01/23/2020  Check In:  Session Check In - 01/23/20 0835      Check-In   Supervising physician immediately available to respond to emergencies See telemetry face sheet for immediately available ER MD    Location [facility]-Cardiac & Pulmonary Rehab    Staff Present Heath Lark, RN, BSN, Jacklynn Bue, MS Exercise Physiologist;Amanda Oletta Darter, [place], ACSM CEP, Exercise Physiologist    Virtual Visit No    Medication changes reported     No    Fall or balance concerns reported    No    Warm-up and Cool-down Performed on first and last piece of equipment    Resistance Training Performed Yes    VAD Patient? No    PAD/SET Patient? No      Pain Assessment   Currently in Pain? No/denies              Social History   Tobacco Use  Smoking Status Former Smoker  . Packs/day: 0.25  . Years: 1.00  . Pack years: 0.25  . Types: Cigarettes  . Quit date: 08/31/1994  . Years since quitting: 25.4  Smokeless Tobacco Never Used  Tobacco Comment   quit 28 years ago    Goals Met:  Exercise tolerated well No report of cardiac concerns or symptoms  Goals Unmet:  Not Applicable  Comments: Pt able to follow exercise prescription today without complaint.  Will continue to monitor for progression.    Dr. Emily Filbert is Medical Director for [place] and [facility].

## 2020-01-24 ENCOUNTER — Telehealth: Payer: Self-pay | Admitting: Gastroenterology

## 2020-01-24 NOTE — Telephone Encounter (Signed)
Pt needs refill of Lomotil.  She has appointment in 2/22. Call into [employer]

## 2020-01-25 ENCOUNTER — Inpatient Hospital Stay: Payer: Medicare Other

## 2020-01-25 ENCOUNTER — Inpatient Hospital Stay
Admission: EM | Admit: 2020-01-25 | Discharge: 2020-01-27 | DRG: 378 | Disposition: A | Discharge status: Other Acute Inpatient Hospital | Payer: Medicare Other | Attending: Internal Medicine | Admitting: Internal Medicine

## 2020-01-25 ENCOUNTER — Emergency Department: Payer: Medicare Other

## 2020-01-25 ENCOUNTER — Other Ambulatory Visit: Payer: Self-pay

## 2020-01-25 ENCOUNTER — Encounter: Payer: Self-pay | Admitting: Emergency Medicine

## 2020-01-25 ENCOUNTER — Telehealth: Payer: Self-pay

## 2020-01-25 DIAGNOSIS — Z79899 Other long term (current) drug therapy: Secondary | ICD-10-CM

## 2020-01-25 DIAGNOSIS — G35 Multiple sclerosis: Secondary | ICD-10-CM | POA: Diagnosis present

## 2020-01-25 DIAGNOSIS — E782 Mixed hyperlipidemia: Secondary | ICD-10-CM | POA: Diagnosis not present

## 2020-01-25 DIAGNOSIS — K529 Noninfective gastroenteritis and colitis, unspecified: Secondary | ICD-10-CM | POA: Diagnosis present

## 2020-01-25 DIAGNOSIS — E1122 Type 2 diabetes mellitus with diabetic chronic kidney disease: Secondary | ICD-10-CM | POA: Diagnosis present

## 2020-01-25 DIAGNOSIS — F418 Other specified anxiety disorders: Secondary | ICD-10-CM | POA: Diagnosis present

## 2020-01-25 DIAGNOSIS — Z9049 Acquired absence of other specified parts of digestive tract: Secondary | ICD-10-CM

## 2020-01-25 DIAGNOSIS — Z20822 Contact with and (suspected) exposure to covid-19: Secondary | ICD-10-CM | POA: Diagnosis present

## 2020-01-25 DIAGNOSIS — E1143 Type 2 diabetes mellitus with diabetic autonomic (poly)neuropathy: Secondary | ICD-10-CM | POA: Diagnosis present

## 2020-01-25 DIAGNOSIS — I959 Hypotension, unspecified: Secondary | ICD-10-CM | POA: Diagnosis present

## 2020-01-25 DIAGNOSIS — E1151 Type 2 diabetes mellitus with diabetic peripheral angiopathy without gangrene: Secondary | ICD-10-CM | POA: Diagnosis present

## 2020-01-25 DIAGNOSIS — I69354 Hemiplegia and hemiparesis following cerebral infarction affecting left non-dominant side: Secondary | ICD-10-CM | POA: Diagnosis not present

## 2020-01-25 DIAGNOSIS — N3289 Other specified disorders of bladder: Secondary | ICD-10-CM | POA: Diagnosis not present

## 2020-01-25 DIAGNOSIS — D509 Iron deficiency anemia, unspecified: Secondary | ICD-10-CM | POA: Diagnosis present

## 2020-01-25 DIAGNOSIS — I252 Old myocardial infarction: Secondary | ICD-10-CM | POA: Diagnosis not present

## 2020-01-25 DIAGNOSIS — I255 Ischemic cardiomyopathy: Secondary | ICD-10-CM | POA: Diagnosis present

## 2020-01-25 DIAGNOSIS — K3189 Other diseases of stomach and duodenum: Secondary | ICD-10-CM | POA: Diagnosis not present

## 2020-01-25 DIAGNOSIS — Z8719 Personal history of other diseases of the digestive system: Secondary | ICD-10-CM | POA: Diagnosis not present

## 2020-01-25 DIAGNOSIS — E11649 Type 2 diabetes mellitus with hypoglycemia without coma: Secondary | ICD-10-CM | POA: Diagnosis present

## 2020-01-25 DIAGNOSIS — E039 Hypothyroidism, unspecified: Secondary | ICD-10-CM | POA: Diagnosis present

## 2020-01-25 DIAGNOSIS — E860 Dehydration: Secondary | ICD-10-CM | POA: Diagnosis present

## 2020-01-25 DIAGNOSIS — Z79891 Long term (current) use of opiate analgesic: Secondary | ICD-10-CM

## 2020-01-25 DIAGNOSIS — Z955 Presence of coronary angioplasty implant and graft: Secondary | ICD-10-CM

## 2020-01-25 DIAGNOSIS — I130 Hypertensive heart and chronic kidney disease with heart failure and stage 1 through stage 4 chronic kidney disease, or unspecified chronic kidney disease: Secondary | ICD-10-CM | POA: Diagnosis present

## 2020-01-25 DIAGNOSIS — Z9884 Bariatric surgery status: Secondary | ICD-10-CM

## 2020-01-25 DIAGNOSIS — K922 Gastrointestinal hemorrhage, unspecified: Secondary | ICD-10-CM | POA: Diagnosis present

## 2020-01-25 DIAGNOSIS — R1319 Other dysphagia: Secondary | ICD-10-CM | POA: Diagnosis present

## 2020-01-25 DIAGNOSIS — G4700 Insomnia, unspecified: Secondary | ICD-10-CM | POA: Diagnosis present

## 2020-01-25 DIAGNOSIS — K219 Gastro-esophageal reflux disease without esophagitis: Secondary | ICD-10-CM | POA: Diagnosis present

## 2020-01-25 DIAGNOSIS — Z8249 Family history of ischemic heart disease and other diseases of the circulatory system: Secondary | ICD-10-CM

## 2020-01-25 DIAGNOSIS — Z823 Family history of stroke: Secondary | ICD-10-CM

## 2020-01-25 DIAGNOSIS — R197 Diarrhea, unspecified: Secondary | ICD-10-CM

## 2020-01-25 DIAGNOSIS — K921 Melena: Secondary | ICD-10-CM

## 2020-01-25 DIAGNOSIS — N1831 Chronic kidney disease, stage 3a: Secondary | ICD-10-CM | POA: Diagnosis present

## 2020-01-25 DIAGNOSIS — Z803 Family history of malignant neoplasm of breast: Secondary | ICD-10-CM

## 2020-01-25 DIAGNOSIS — R55 Syncope and collapse: Secondary | ICD-10-CM | POA: Diagnosis present

## 2020-01-25 DIAGNOSIS — Z7902 Long term (current) use of antithrombotics/antiplatelets: Secondary | ICD-10-CM

## 2020-01-25 DIAGNOSIS — Z79890 Hormone replacement therapy (postmenopausal): Secondary | ICD-10-CM

## 2020-01-25 DIAGNOSIS — R079 Chest pain, unspecified: Secondary | ICD-10-CM

## 2020-01-25 DIAGNOSIS — Z90721 Acquired absence of ovaries, unilateral: Secondary | ICD-10-CM

## 2020-01-25 DIAGNOSIS — E785 Hyperlipidemia, unspecified: Secondary | ICD-10-CM | POA: Diagnosis present

## 2020-01-25 DIAGNOSIS — I208 Other forms of angina pectoris: Secondary | ICD-10-CM | POA: Diagnosis not present

## 2020-01-25 DIAGNOSIS — I2510 Atherosclerotic heart disease of native coronary artery without angina pectoris: Secondary | ICD-10-CM | POA: Diagnosis present

## 2020-01-25 DIAGNOSIS — I10 Essential (primary) hypertension: Secondary | ICD-10-CM | POA: Diagnosis present

## 2020-01-25 DIAGNOSIS — Z86711 Personal history of pulmonary embolism: Secondary | ICD-10-CM

## 2020-01-25 DIAGNOSIS — I25119 Atherosclerotic heart disease of native coronary artery with unspecified angina pectoris: Secondary | ICD-10-CM | POA: Diagnosis present

## 2020-01-25 DIAGNOSIS — Z85820 Personal history of malignant melanoma of skin: Secondary | ICD-10-CM

## 2020-01-25 DIAGNOSIS — Z841 Family history of disorders of kidney and ureter: Secondary | ICD-10-CM

## 2020-01-25 DIAGNOSIS — Z7982 Long term (current) use of aspirin: Secondary | ICD-10-CM

## 2020-01-25 DIAGNOSIS — Z8679 Personal history of other diseases of the circulatory system: Secondary | ICD-10-CM | POA: Diagnosis not present

## 2020-01-25 DIAGNOSIS — Z87891 Personal history of nicotine dependence: Secondary | ICD-10-CM

## 2020-01-25 DIAGNOSIS — I5032 Chronic diastolic (congestive) heart failure: Secondary | ICD-10-CM | POA: Diagnosis present

## 2020-01-25 DIAGNOSIS — R531 Weakness: Secondary | ICD-10-CM | POA: Diagnosis not present

## 2020-01-25 HISTORY — DX: Gastrointestinal hemorrhage, unspecified: K92.2

## 2020-01-25 LAB — RESP PANEL BY RT-PCR (FLU A&B, COVID) ARPGX2
Influenza A by PCR: NEGATIVE
Influenza B by PCR: NEGATIVE
SARS Coronavirus 2 by RT PCR: NEGATIVE

## 2020-01-25 LAB — GASTROINTESTINAL PANEL BY PCR, STOOL (REPLACES STOOL CULTURE)

## 2020-01-25 LAB — TYPE AND SCREEN
ABO/RH(D): O NEG
Antibody Screen: NEGATIVE

## 2020-01-25 LAB — CBG MONITORING, ED: Glucose-Capillary: 81 mg/dL (ref 70–99)

## 2020-01-25 LAB — CBC
HCT: 34.2 % — ABNORMAL LOW (ref 36.0–46.0)
Hemoglobin: 10.5 g/dL — ABNORMAL LOW (ref 12.0–15.0)
MCH: 26.1 pg (ref 26.0–34.0)
MCHC: 30.7 g/dL (ref 30.0–36.0)
MCV: 85.1 fL (ref 80.0–100.0)
Platelets: 276 10*3/uL (ref 150–400)
RBC: 4.02 MIL/uL (ref 3.87–5.11)
RDW: 13.2 % (ref 11.5–15.5)
WBC: 12.7 10*3/uL — ABNORMAL HIGH (ref 4.0–10.5)
nRBC: 0 % (ref 0.0–0.2)

## 2020-01-25 LAB — COMPREHENSIVE METABOLIC PANEL
ALT: 18 U/L (ref 0–44)
AST: 20 U/L (ref 15–41)
Albumin: 3 g/dL — ABNORMAL LOW (ref 3.5–5.0)
Alkaline Phosphatase: 91 U/L (ref 38–126)
Anion gap: 9 (ref 5–15)
BUN: 49 mg/dL — ABNORMAL HIGH (ref 6–20)
CO2: 21 mmol/L — ABNORMAL LOW (ref 22–32)
Calcium: 8.2 mg/dL — ABNORMAL LOW (ref 8.9–10.3)
Chloride: 105 mmol/L (ref 98–111)
Creatinine, Ser: 2.3 mg/dL — ABNORMAL HIGH (ref 0.44–1.00)
GFR, Estimated: 25 mL/min — ABNORMAL LOW (ref >60)
Glucose, Bld: 122 mg/dL — ABNORMAL HIGH (ref 70–99)
Potassium: 5 mmol/L (ref 3.5–5.1)
Sodium: 135 mmol/L (ref 135–145)
Total Bilirubin: 0.6 mg/dL (ref 0.3–1.2)
Total Protein: 5.9 g/dL — ABNORMAL LOW (ref 6.5–8.1)

## 2020-01-25 LAB — C DIFFICILE (CDIFF) QUICK SCRN (NO PCR REFLEX)
C Diff antigen: NEGATIVE
C Diff interpretation: NOT DETECTED
C Diff toxin: NEGATIVE

## 2020-01-25 LAB — HEMOGLOBIN A1C
Hgb A1c MFr Bld: 5 % (ref 4.8–5.6)
Mean Plasma Glucose: 96.8 mg/dL

## 2020-01-25 LAB — LIPASE, BLOOD: Lipase: 35 U/L (ref 11–51)

## 2020-01-25 LAB — GLUCOSE, CAPILLARY
Glucose-Capillary: 123 mg/dL — ABNORMAL HIGH (ref 70–99)
Glucose-Capillary: 94 mg/dL (ref 70–99)

## 2020-01-25 LAB — HEMOGLOBIN AND HEMATOCRIT, BLOOD
HCT: 25.3 % — ABNORMAL LOW (ref 36.0–46.0)
Hemoglobin: 7.9 g/dL — ABNORMAL LOW (ref 12.0–15.0)

## 2020-01-25 MED ORDER — DIPHENOXYLATE-ATROPINE 2.5-0.025 MG PO TABS
2.0000 | ORAL_TABLET | Freq: Once | ORAL | Status: AC
  Administered 2020-01-25: 15:00:00 2 via ORAL
  Filled 2020-01-25: qty 2

## 2020-01-25 MED ORDER — TRAZODONE HCL 100 MG PO TABS: 100.0000 mg | ORAL_TABLET | Freq: Every evening | ORAL | Status: DC | PRN

## 2020-01-25 MED ORDER — FAMOTIDINE IN NACL 20-0.9 MG/50ML-% IV SOLN
20.0000 mg | Freq: Once | INTRAVENOUS | Status: AC
  Administered 2020-01-25: 15:00:00 20 mg via INTRAVENOUS
  Filled 2020-01-25: qty 50

## 2020-01-25 MED ORDER — PANTOPRAZOLE SODIUM 40 MG IV SOLR: 40.0000 mg | Freq: Two times a day (BID) | INTRAVENOUS | Status: DC

## 2020-01-25 MED ORDER — SODIUM CHLORIDE 0.9 % IV BOLUS
1000.0000 mL | Freq: Once | INTRAVENOUS | Status: AC
  Administered 2020-01-25: 12:00:00 1000 mL via INTRAVENOUS

## 2020-01-25 MED ORDER — BUPROPION HCL ER (SR) 100 MG PO TB12: 200.0000 mg | ORAL_TABLET | Freq: Every morning | ORAL | Status: DC

## 2020-01-25 MED ORDER — METOCLOPRAMIDE HCL 5 MG/ML IJ SOLN
10.0000 mg | Freq: Four times a day (QID) | INTRAMUSCULAR | Status: AC
  Administered 2020-01-25: 19:00:00 10 mg via INTRAVENOUS
  Filled 2020-01-25: qty 2

## 2020-01-25 MED ORDER — INSULIN ASPART 100 UNIT/ML ~~LOC~~ SOLN: 0.0000 [IU] | Freq: Three times a day (TID) | SUBCUTANEOUS | Status: DC

## 2020-01-25 MED ORDER — ONDANSETRON HCL 4 MG/2ML IJ SOLN
4.0000 mg | Freq: Once | INTRAMUSCULAR | Status: AC
  Administered 2020-01-25: 12:00:00 4 mg via INTRAVENOUS
  Filled 2020-01-25: qty 2

## 2020-01-25 MED ORDER — INSULIN ASPART 100 UNIT/ML ~~LOC~~ SOLN: 0.0000 [IU] | SUBCUTANEOUS | Status: DC

## 2020-01-25 MED ORDER — SODIUM CHLORIDE 0.9 % IV SOLN: INTRAVENOUS | Status: DC

## 2020-01-25 MED ORDER — ALPRAZOLAM 0.5 MG PO TABS: 1.0000 mg | ORAL_TABLET | Freq: Two times a day (BID) | ORAL | Status: DC | PRN

## 2020-01-25 MED ORDER — UBROGEPANT 100 MG PO TABS: 100.0000 mg | ORAL_TABLET | Freq: Two times a day (BID) | ORAL | Status: DC | PRN

## 2020-01-25 MED ORDER — SODIUM CHLORIDE 0.9 % IV BOLUS
1000.0000 mL | Freq: Once | INTRAVENOUS | Status: AC
  Administered 2020-01-25: 15:00:00 1000 mL via INTRAVENOUS

## 2020-01-25 MED ORDER — METOCLOPRAMIDE HCL 5 MG/ML IJ SOLN
20.0000 mg | Freq: Once | INTRAVENOUS | Status: AC
  Administered 2020-01-25: 19:00:00 20 mg via INTRAVENOUS
  Filled 2020-01-25: qty 4

## 2020-01-25 MED ORDER — HYDROMORPHONE HCL 1 MG/ML IJ SOLN
1.0000 mg | Freq: Once | INTRAMUSCULAR | Status: AC
  Administered 2020-01-25: 21:00:00 1 mg via INTRAVENOUS
  Filled 2020-01-25: qty 1

## 2020-01-25 MED ORDER — TECHNETIUM TC 99M-LABELED RED BLOOD CELLS IV KIT
20.0000 | PACK | Freq: Once | INTRAVENOUS | Status: AC | PRN
  Administered 2020-01-25: 16:00:00 21.172 via INTRAVENOUS

## 2020-01-25 MED ORDER — POLYVINYL ALCOHOL 1.4 % OP SOLN
1.0000 [drp] | Freq: Every day | OPHTHALMIC | Status: DC | PRN
  Filled 2020-01-25: qty 15

## 2020-01-25 MED ORDER — FAMOTIDINE IN NACL 20-0.9 MG/50ML-% IV SOLN: 20.0000 mg | INTRAVENOUS | Status: DC

## 2020-01-25 MED ORDER — VENLAFAXINE HCL ER 75 MG PO CP24: 75.0000 mg | ORAL_CAPSULE | Freq: Every day | ORAL | Status: DC

## 2020-01-25 MED ORDER — ROSUVASTATIN CALCIUM 20 MG PO TABS: 40.0000 mg | ORAL_TABLET | Freq: Every day | ORAL | Status: DC

## 2020-01-25 MED ORDER — LEVOTHYROXINE SODIUM 50 MCG PO TABS: 25.0000 ug | ORAL_TABLET | Freq: Every day | ORAL | Status: DC

## 2020-01-25 MED ORDER — ADULT MULTIVITAMIN W/MINERALS CH: 1.0000 | ORAL_TABLET | Freq: Every day | ORAL | Status: DC

## 2020-01-25 MED ORDER — PANTOPRAZOLE SODIUM 40 MG IV SOLR
40.0000 mg | Freq: Two times a day (BID) | INTRAVENOUS | Status: DC
  Administered 2020-01-25: 19:00:00 40 mg via INTRAVENOUS
  Filled 2020-01-25 (×3): qty 40

## 2020-01-25 MED ORDER — GABAPENTIN 300 MG PO CAPS
600.0000 mg | ORAL_CAPSULE | Freq: Two times a day (BID) | ORAL | Status: DC
  Administered 2020-01-25 – 2020-01-26 (×3): 600 mg via ORAL
  Filled 2020-01-25 (×3): qty 2

## 2020-01-25 NOTE — ED Notes (Signed)
Pt transported to [facility] GI study.

## 2020-01-25 NOTE — Telephone Encounter (Signed)
Pt sent Mychart message to advise that she was being seen in the ED for hypotension while at cardiac rehab this am. Reports diarrhea for 2 weeks and this morning stool was black in color. Pt is on daily plavix and aspirin. Pt does have an appt with Dr. [name] 02/26/2020 at 08:20am, advised to keep this am for f/u. Labs and EKG were obtain at ER.

## 2020-01-25 NOTE — ED Notes (Signed)
Pt completed bowel movement but was unable to obtain specimen due to missing the collection hat.  Enough stool able to be collected for hemoccult.  MD made aware.

## 2020-01-25 NOTE — ED Notes (Signed)
Pt returned from Ct.

## 2020-01-25 NOTE — ED Notes (Signed)
GI at bedside

## 2020-01-25 NOTE — ED Notes (Signed)
Admitting NP messaged via secure chat to request pain medication for patients 8/10 headache and general body aches.

## 2020-01-25 NOTE — ED Notes (Signed)
ED Provider at bedside. 

## 2020-01-25 NOTE — ED Triage Notes (Signed)
Pt reports has had diarrhea for 2 weeks and this am if was only water but it was black water. Pt reports feels weak with no energy. Pt states takes plavix and aspirin. Pt pale in color

## 2020-01-25 NOTE — Consult Note (Signed)
Vonda Antigua, MD [address] [place], [place], [place], [address] [address], [place], [place], [place], [address] Phone: [phone]  Fax: [phone]  Consultation  Referring Provider:     Dr. Francine Graven Primary Care Physician:  [name], MD Reason for Consultation:     Weakness, dark stools  Date of Admission:  01/25/2020 Date of Consultation:  01/25/2020         HPI:   [name] is a 49 y.o. female who presents with fatigue, diarrhea for 2 weeks with recent antibiotics use for UTI.  Patient states the antibiotics for UTI were not prescribed before her diarrhea started.  Her diarrhea had already began and she received antibiotic treatment for UTI in the midst of having diarrhea.  Patient reports dark black stool 1-2 episodes starting yesterday.  No bright red blood per rectum.  No hematemesis.  She had an episode of emesis in the ER but that did not consist of any blood as per the ER provider.  No fever or chills.  Patient has previous upper and lower endoscopies by Dr. Allen Norris, with the most recent upper endoscopy being in March 2021 for dysphagia which was normal including normal esophagus, gastric bypass anatomy.  Esophageal biopsies were normal.  Colonoscopy in 2019 was normal.  Random biopsies did not show any evidence of microscopic colitis.  Past Medical History:  Diagnosis Date  . Acute colitis 01/27/2017  . Acute pyelonephritis   . Anemia    iron deficiency anemia  . Aortic arch aneurysm ([place])   . BRCA negative 2014  . CAD (coronary artery disease)    a. 08/2003 Cath: LAD 30-40-med Rx; b. 11/2014 PCI: LAD 40m(3.25x23 Xience Alpine DES); c. 06/2015 PCI: D1 (2.25x12 Resolute Integrity DES); d. 06/2017 PCI: Patent mLAD stent, D2 95 (PTCA); e. 09/2017 PCI: D2 99ost (CBA); d. 12/2017 Cath: LM nl, LAD 37108m80d (small), D1 40ost, D2 95ost, LCX 40p, RCA 40ost/p->Med rx for D2 given restenosis.  . Colitis 06/03/2015  . Colon polyp   . CVA (cerebral vascular  accident) ([facility]   Left side weakness.   . Degenerative tear of glenoid labrum of right shoulder 03/15/2017  . Diabetes mellitus without complication ([facility]  . Family history of breast cancer    BRCA neg 2014  . Femur fracture, left (H[facility]ero [place] South9/10/2018  . Gastric ulcer 04/27/2011  . History of echocardiogram    a. 03/2017 Echo: EF 60-65%, no rwma; b. 02/2018 Echo: EF 60-65%, no rwma. Nl RV fxn. No cardiac source of emboli (admitted w/ stroke).  . Hypertension   . Malignant melanoma of skin of scalp (HCDavid[place]  . MI, acute, non ST segment elevation ([facility]  . Neuromuscular disorder ([facility]CR[place]  . S/P drug eluting coronary stent placement 06/04/2015  . Sepsis ([facility]2/14/2019  . [facility]   a. 02/2018 MRI: 108m1mate acute/early subacute L medial frontal lobe inarct; b. 02/2018 MRA No large vessel occlusion or aneurysm. Mod to sev L P2 stenosis. thready L vertebral artery, diffusely dzs'd; c. 02/2018 Carotid U/S: <50% bilat ICA dzs.    Past Surgical History:  Procedure Laterality Date  . APPENDECTOMY    . CARDIAC CATHETERIZATION N/A 11/09/2014   Procedure: Coronary Angiography;  Surgeon: TimMinna MerrittsD;  [facility];  Service: Cardiovascular;  Laterality: N/A;  . CARDIAC CATHETERIZATION N/A 11/12/2014   Procedure: Coronary Stent Intervention;  Surgeon: AleIsaias CowmanD;  [facility];  Service: Cardiovascular;  Laterality: N/A;  . CARDIAC CATHETERIZATION N/A 04/18/2015   Procedure: Left Heart Cath and Coronary Angiography;  Surgeon: Minna Merritts, MD;  Location: [place] CV LAB;  Service: Cardiovascular;  Laterality: N/A;  . CARDIAC CATHETERIZATION Left 06/04/2015   Procedure: Left Heart Cath and Coronary Angiography;  Surgeon: Wellington Hampshire, MD;  Location: [place] CV LAB;  Service: Cardiovascular;  Laterality: Left;  . CARDIAC CATHETERIZATION N/A 06/04/2015   Procedure: Coronary Stent Intervention;  Surgeon: Wellington Hampshire, MD;  Location: [place] CV LAB;  Service: Cardiovascular;  Laterality: N/A;  . CESAREAN SECTION  2001  . CHOLECYSTECTOMY N/A 11/18/2016   Procedure: LAPAROSCOPIC CHOLECYSTECTOMY WITH INTRAOPERATIVE CHOLANGIOGRAM;  Surgeon: Christene Lye, MD;  Location: [facility];  Service: General;  Laterality: N/A;  . COLONOSCOPY WITH PROPOFOL N/A 04/27/2016   Procedure: COLONOSCOPY WITH PROPOFOL;  Surgeon: [name], MD;  Location: [place];  Service: Endoscopy;  Laterality: N/A;  . COLONOSCOPY WITH PROPOFOL N/A 01/12/2018   Procedure: COLONOSCOPY WITH PROPOFOL;  Surgeon: [place], Benay Pike, MD;  Location: [facility] ENDOSCOPY;  Service: Endoscopy;  Laterality: N/A;  . CORONARY ANGIOPLASTY    . CORONARY BALLOON ANGIOPLASTY N/A 06/29/2017   Procedure: CORONARY BALLOON ANGIOPLASTY;  Surgeon: Wellington Hampshire, MD;  Location: [place] CV LAB;  Service: Cardiovascular;  Laterality: N/A;  . CORONARY BALLOON ANGIOPLASTY N/A 09/20/2017   Procedure: CORONARY BALLOON ANGIOPLASTY;  Surgeon: Wellington Hampshire, MD;  Location: [place] CV LAB;  Service: Cardiovascular;  Laterality: N/A;  . CORONARY STENT INTERVENTION N/A 12/13/2019   Procedure: CORONARY STENT INTERVENTION;  Surgeon: Wellington Hampshire, MD;  Location: [place] CV LAB;  Service: Cardiovascular;  Laterality: N/A;  . DILATION AND CURETTAGE OF UTERUS    . ESOPHAGOGASTRODUODENOSCOPY (EGD) WITH PROPOFOL N/A 09/14/2014   Procedure: ESOPHAGOGASTRODUODENOSCOPY (EGD) WITH PROPOFOL;  Surgeon: Josefine Class, MD;  [facility] ENDOSCOPY;  Service: Endoscopy;  Laterality: N/A;  . ESOPHAGOGASTRODUODENOSCOPY (EGD) WITH PROPOFOL N/A 04/27/2016   Procedure: ESOPHAGOGASTRODUODENOSCOPY (EGD) WITH PROPOFOL;  Surgeon: [name], MD;  Location: [place];  Service: Endoscopy;  Laterality: N/A;  Diabetic - oral meds  . ESOPHAGOGASTRODUODENOSCOPY (EGD) WITH PROPOFOL N/A 01/12/2018   Procedure: ESOPHAGOGASTRODUODENOSCOPY (EGD) WITH PROPOFOL;  Surgeon:  [place], Benay Pike, MD;  Location: [facility]OSCOPY;  Service: Endoscopy;  Laterality: N/A;  . ESOPHAGOGASTRODUODENOSCOPY (EGD) WITH PROPOFOL N/A 04/11/2019   Procedure: ESOPHAGOGASTRODUODENOSCOPY (EGD) WITH PROPOFOL;  Surgeon: [name], MD;  Location: [facility] ENDOS[facility];  Service: Endoscopy;  Laterality: N/A;  . GASTRIC BYPASS  09/2009   [place] (IM) NAIL INTERTROCHANTERIC Left 10/13/2018   Procedure: INTRAMEDULLARY (IM) NAIL INTERTROCHANTRIC;  Surgeon: Leandrew Koyanagi, MD;  Location: [place];  Service: Orthopedics;  Laterality: Left;  . Left Carotid to sublcavian artery bypass w/ subclavian artery ligation     a. Performed @ [facility].  . LEFT HEART CATH AND CORONARY ANGIOGRAPHY Left 06/29/2017   Procedure: LEFT HEART CATH AND CORONARY ANGIOGRAPHY;  Surgeon: Wellington Hampshire, MD;  Location: [place] CV LAB;  Service: Cardiovascular;  Laterality: Left;  . LEFT HEART CATH AND CORONARY ANGIOGRAPHY N/A 09/20/2017   Procedure: LEFT HEART CATH AND CORONARY ANGIOGRAPHY;  Surgeon: Wellington Hampshire, MD;  Location: [place] CV LAB;  Service: Cardiovascular;  Laterality: N/A;  . LEFT HEART CATH AND CORONARY ANGIOGRAPHY N/A 12/20/2017   Procedure: LEFT HEART CATH AND CORONARY ANGIOGRAPHY;  Surgeon: Wellington Hampshire, MD;  Location: [place] CV LAB;  Service: Cardiovascular;  Laterality: N/A;  . LEFT HEART CATH AND CORONARY ANGIOGRAPHY N/A 04/20/2019   Procedure: LEFT HEART CATH AND CORONARY ANGIOGRAPHY possible PCI;  Surgeon: Minna Merritts, MD;  Location: [place] CV LAB;  Service: Cardiovascular;  Laterality: N/A;  . LEFT HEART CATH AND CORONARY ANGIOGRAPHY N/A 12/13/2019   Procedure: LEFT HEART CATH AND CORONARY ANGIOGRAPHY;  Surgeon: Wellington Hampshire, MD;  Location: [place] CV LAB;  Service: Cardiovascular;  Laterality: N/A;  . MELANOMA EXCISION  2016   Dr. Evorn Gong  . [place]  2002  . RIGHT OOPHORECTOMY    . SHOULDER ARTHROSCOPY WITH  OPEN ROTATOR CUFF REPAIR Right 01/07/2016   Procedure: SHOULDER ARTHROSCOPY WITH DEBRIDMENT, SUBACHROMIAL DECOMPRESSION;  Surgeon: Corky Mull, MD;  Location: [facility];  Service: Orthopedics;  Laterality: Right;  . SHOULDER ARTHROSCOPY WITH OPEN ROTATOR CUFF REPAIR Right 03/16/2017   Procedure: SHOULDER ARTHROSCOPY WITH OPEN ROTATOR CUFF REPAIR POSSIBLE BICEPS TENODESIS;  Surgeon: Corky Mull, MD;  Location: [facility] OR[facility];  Service: Orthopedics;  Laterality: Right;  . TRIGGER FINGER RELEASE Right     [place]    Prior to Admission medications   Medication Sig Start Date End Date Taking? Authorizing Provider  ALPRAZolam Duanne Moron) 1 MG tablet Take 1 tablet by mouth twice daily as needed Patient taking differently: Take 1 mg by mouth 2 (two) times daily as needed for anxiety.  08/14/19   [name], MD  amLODipine (NORVASC) 2.5 MG tablet Take 1 tablet (2.5 mg total) by mouth daily. Patient taking differently: Take 2.5 mg by mouth every evening.  10/25/19   Minna Merritts, MD  aspirin EC 81 MG tablet Take 1 tablet (81 mg total) by mouth daily. Swallow whole. 12/07/19   Wellington Hampshire, MD  Biotin w/ Vitamins C & E (HAIR SKIN & NAILS GUMMIES PO) Take 4 tablets by mouth daily.     [provider]  buPROPion (WELLBUTRIN SR) 200 MG 12 hr tablet Take 200 mg by mouth every morning. 11/17/19   [provider]  carvedilol (COREG) 12.5 MG tablet Take 1 tablet (12.5 mg total) by mouth 2 (two) times daily with a meal. 10/20/19   Dwyane Dee, MD  clopidogrel (PLAVIX) 75 MG tablet Take 1 tablet (75 mg total) by mouth daily. 12/22/19   Loel Dubonnet, NP  desvenlafaxine (PRISTIQ) 50 MG 24 hr tablet Take 50 mg by mouth daily. 12/11/19   [provider]  doxycycline (VIBRA-TABS) 100 MG tablet Take 1 tablet (100 mg total) by mouth 2 (two) times daily for 10 days. 01/15/20 01/25/20  [name], MD  EUTHYROX 25 MCG tablet TAKE 1 TABLET BY MOUTH ONCE DAILY BEFORE BREAKFAST  12/02/19   [name], MD  FIBER COMPLETE PO Take 3 capsules by mouth daily.     [provider]  gabapentin (NEURONTIN) 300 MG capsule Take 2 capsules (600 mg total) by mouth 2 (two) times daily. 01/17/20   [name], MD  isosorbide mononitrate (IMDUR) 120 MG 24 hr tablet Take 1 tablet (120 mg total) by mouth daily. 10/21/19   Dwyane Dee, MD  levothyroxine (SYNTHROID) 25 MCG tablet Take 25 mcg by mouth daily before breakfast.    [provider]  Multiple Vitamin (MULTIVITAMIN) capsule Take 1 capsule by mouth daily. gummies     [provider]  nitroGLYCERIN (NITROSTAT) 0.4 MG SL tablet Place 1 tablet (0.4 mg total) under the tongue  every 5 (five) minutes as needed for chest pain. 10/20/19   Dwyane Dee, MD  pantoprazole (PROTONIX) 40 MG tablet Take 1 tablet by mouth twice daily Patient taking differently: Take 40 mg by mouth 2 (two) times daily.  06/06/19   [name], MD  Polyethyl Glycol-Propyl Glycol (SYSTANE OP) Place 1 drop into both eyes daily as needed (dry eyes).    [provider]  promethazine (PHENERGAN) 25 MG tablet Take 25 mg by mouth every 6 (six) hours as needed for nausea or vomiting.  07/25/19   [provider]  rosuvastatin (CRESTOR) 40 MG tablet Take 1 tablet (40 mg total) by mouth daily at 6 PM. 10/25/19   Gollan, Kathlene November, MD  traZODone (DESYREL) 100 MG tablet TAKE 1 TO 2 TABLETS BY MOUTH AT BEDTIME Patient taking differently: Take 100-200 mg by mouth at bedtime as needed for sleep.  11/15/19   [name], MD  UBRELVY 100 MG TABS Take 100 mg by mouth 2 (two) times daily as needed (migraine).  09/13/19   [provider]    Family History  Problem Relation Age of Onset  . Hypertension Mother   . Anxiety disorder Mother   . Depression Mother   . Bipolar disorder Mother   . Heart disease Mother        No details  . Hyperlipidemia Mother   . Kidney disease Father   . Heart disease Father 25  .  Hypertension Father   . Diabetes Father   . Stroke Father   . Colon cancer Father        dx in his 13's  . Anxiety disorder Father   . Depression Father   . Skin cancer Father   . Kidney disease Sister   . Thyroid nodules Sister   . Hypertension Sister   . Hypertension Sister   . Diabetes Sister   . Hyperlipidemia Sister   . Depression Sister   . Breast cancer Maternal Aunt 65  . Breast cancer Maternal Aunt 23  . Ovarian cancer Cousin   . Colon cancer Cousin   . Kidney cancer Cousin   . Breast cancer Other   . Bladder Cancer Neg Hx      Social History   Tobacco Use  . Smoking status: Former Smoker    Packs/day: 0.25    Years: 1.00    Pack years: 0.25    Types: Cigarettes    Quit date: 08/31/1994    Years since quitting: 25.4  . Smokeless tobacco: Never Used  . Tobacco comment: quit 28 years ago  Vaping Use  . Vaping Use: Never used  Substance Use Topics  . Alcohol use: No    Alcohol/week: 0.0 standard drinks  . Drug use: No    Allergies as of 01/25/2020 - Review Complete 01/25/2020  Allergen Reaction Noted  . Lipitor [atorvastatin] Other (See Comments) 04/14/2017  . Tramadol Other (See Comments) 01/12/2013    Review of Systems:    All systems reviewed and negative except where noted in HPI.   Physical Exam:  Vital signs in last 24 hours: Vitals:   01/25/20 0908 01/25/20 0911  BP:  107/76  Pulse:  83  Resp:  20  Temp:  97.8 F (36.6 C)  TempSrc:  Oral  SpO2:  100%  Weight: 67.1 kg   Height: '5\' 3"'  (1.6 m)      General:   Pleasant, cooperative in NAD Head:  Normocephalic and atraumatic. Eyes:   No  icterus.   Conjunctiva pink. PERRLA. Ears:  Normal auditory acuity. Neck:  Supple; no masses or thyroidomegaly Lungs: Respirations even and unlabored. Lungs clear to auscultation bilaterally.   No wheezes, crackles, or rhonchi.  Abdomen:  Soft, nondistended, nontender. Normal bowel sounds. No appreciable masses or hepatomegaly.  No rebound or guarding.   Neurologic:  Alert and oriented x3;  grossly normal neurologically. Skin:  Intact without significant lesions or rashes. Cervical Nodes:  No significant cervical adenopathy. Psych:  Alert and cooperative. Normal affect.  LAB RESULTS: Recent Labs    01/25/20 0916  WBC 12.7*  HGB 10.5*  HCT 34.2*  PLT 276   BMET Recent Labs    01/25/20 0916  NA 135  K 5.0  CL 105  CO2 21*  GLUCOSE 122*  BUN 49*  CREATININE 2.30*  CALCIUM 8.2*   LFT Recent Labs    01/25/20 0916  PROT 5.9*  ALBUMIN 3.0*  AST 20  ALT 18  ALKPHOS 91  BILITOT 0.6   PT/INR No results for input(s): LABPROT, INR in the last 72 hours.  STUDIES: No results found.    Impression / Plan:   [name] is a 49 y.o. y/o female with fatigue, diarrhea for 2 weeks with previous history of gastric bypass  Patient CT scan which was done without contrast due to her renal function, shows high density material in the native proximal duodenum, "favoring intraluminal duodenal hemorrhage".  I discussed these findings with the reading radiologist and he states he compared this with the patient's previous MRI and did not see a mass on the MRI.  He therefore favors this to be likely blood, and this may be old blood instead of active extravasation.  We discussed options of further imaging evaluation and although RBC scan may give [place] a limited answer as it might not localize the direct location of bleeding it could tell us if there is active extravasation  Therefore, would recommend RBC scan at this time.  Evaluation of the Native stomach/duodenum in this patient's gastric bypass anatomy, to reach the area of high density material, would need to be done at a tertiary center as myself or the on-call endoscopist this weekend at [facility] would not be able to reach the native or bypassed anatomy with our regular procedures  If RBC scan is positive, would recommend evaluation with vascular surgery  However, if patient's  hemoglobin remains stable, endoluminal evaluation of the native stomach can be done as an outpatient at outlying facility.  However, if patient continues to have signs of active GI bleeding and the source is suspected to be her native stomach/duodenum, she may need to be transferred to tertiary facility.  Continue serial CBCs and transfuse as needed  PPI IV twice daily  Continue serial CBCs and transfuse PRN Avoid NSAIDs Maintain 2 large-bore IV lines Please page GI with any acute hemodynamic changes, or signs of active GI bleeding  Given recent antibiotic use, and diarrhea would recommend evaluating for infectious etiology.  GI panel and C. difficile pending  COVID-19 testing pending at this time  Dr. Marius Ditch will follow from tonight and if endoscopy is needed and can be done here, she will schedule at that time  Maintain n.p.o. status at this time  Thank you for involving me in the care of this patient.      LOS: 0 days   Virgel Manifold, MD  01/25/2020, 2:36 PM

## 2020-01-25 NOTE — Progress Notes (Signed)
Daily Session Note  Patient Details  Name: Kamea Dacosta MRN: [id number] Date of Birth: 10-26-1970 Referring Provider:   Flowsheet Row Cardiac Rehab from 01/04/2020 in [facility]  Referring Provider Kathlyn Sacramento MD      Encounter Date: 01/25/2020  Check In:  Session Check In - 01/25/20 0731      Check-In   Supervising physician immediately available to respond to emergencies See telemetry face sheet for immediately available ER MD    Location [facility]Cardiac [facility] Pulmonary Re[facility]    Staff Present [name], MPA, RN;Melissa Caiola RDN, Rowe Pavy, BA, ACSM CEP, Exercise Physiologist    Virtual Visit No    Medication changes reported     No    Fall or balance concerns reported    No    Warm-up and Cool-down Performed on first and last piece of equipment    Resistance Training Performed Yes    VAD Patient? No    PAD/SET Patient? No      Pain Assessment   Currently in Pain? No/denies              Social History   Tobacco Use  Smoking Status Former Smoker   Packs/day: 0.25   Years: 1.00   Pack years: 0.25   Types: Cigarettes   Quit date: 08/31/1994   Years since quitting: 25.4  Smokeless Tobacco Never Used  Tobacco Comment   quit 28 years ago    Goals Met:  Independence with exercise equipment Exercise tolerated well No report of cardiac concerns or symptoms Strength training completed today  Goals Unmet:  Not Applicable  Comments: Pt able to follow exercise prescription today without complaint.  Will continue to monitor for progression.    Dr. Emily Filbert is Medical Director for [place] and [facility].

## 2020-01-25 NOTE — ED Provider Notes (Signed)
[facility] Emergency Department Provider Note   ____________________________________________   Event Date/Time   First MD Initiated Contact with Patient 01/25/20 1049     (approximate)  I have reviewed the triage vital signs and the nursing notes.   HISTORY  Chief Complaint Diarrhea, Melena, and Weakness    HPI [name] is a 49 y.o. female with a stated past medical history of iron deficiency anemia, CAD, recurrent colitis, CVA with residual left-sided weakness, type 2 diabetes, CKD, and hypertension who presents for diarrhea for the last 2 weeks.  Patient states that she has also had associated nausea/vomiting over the past 5 days.  Patient was able to keep food down this morning which was her last p.o. intake.  Patient states 2 hours prior to this interview was the last episode of diarrhea that she describes as "black water".  Patient also states increased generalized weakness.  Patient denies any blood thinner use but does endorse the use of aspirin and Plavix after her last CVA.  Patient currently denies any vision changes, tinnitus, difficulty speaking, facial droop, sore throat, chest pain, shortness of breath, dysuria, or numbness/paresthesias in any extremity         Past Medical History:  Diagnosis Date  . Acute colitis 01/27/2017  . Acute pyelonephritis   . Anemia    iron deficiency anemia  . Aortic arch aneurysm ([place])   . BRCA negative 2014  . CAD (coronary artery disease)    a. 08/2003 Cath: LAD 30-40-med Rx; b. 11/2014 PCI: LAD 54m(3.25x23 Xience Alpine DES); c. 06/2015 PCI: D1 (2.25x12 Resolute Integrity DES); d. 06/2017 PCI: Patent mLAD stent, D2 95 (PTCA); e. 09/2017 PCI: D2 99ost (CBA); d. 12/2017 Cath: LM nl, LAD 370m80d (small), D1 40ost, D2 95ost, LCX 40p, RCA 40ost/p->Med rx for D2 given restenosis.  . Colitis 06/03/2015  . Colon polyp   . CVA (cerebral vascular accident) ([facility]Isle   Left side weakness.   . Degenerative  tear of glenoid labrum of right shoulder 03/15/2017  . Diabetes mellitus without complication (HCL[facility] [place]  . Family history of breast cancer    BRCA neg 2014  . Femur fracture, left (HCAlberta9/10/2018  . Gastric ulcer 04/27/2011  . History of echocardiogram    a. 03/2017 Echo: EF 60-65%, no rwma; b. 02/2018 Echo: EF 60-65%, no rwma. Nl RV fxn. No cardiac source of emboli (admitted w/ stroke).  . Hypertension   . Malignant melanoma of skin of scalp (HCCamuy  . MI, acute, non ST segment elevation (HCUnion  . Neuromuscular disorder (HCHomosassa [place]  . S/P drug eluting coronary stent placement 06/04/2015  . Sepsis (HCHaddonfield2/14/2019  . Stroke ([facility]   a. 02/2018 MRI: 33m60mate acute/early subacute L medial frontal lobe inarct; b. 02/2018 MRA No large vessel occlusion or aneurysm. Mod to sev L P2 stenosis. thready L vertebral artery, diffusely dzs'd; c. 02/2018 Carotid U/S: <50% bilat ICA dzs.    Patient Active Problem List   Diagnosis Date Noted  . Pain in limb 01/05/2020  . Pulmonary embolism (HCCCollege0/06/2019  . Chest pain 11/05/2019  . Type II diabetes mellitus with renal manifestations (HCCKeddie0/04/2019  . Acute renal failure superimposed on stage 3a chronic kidney disease (HCCHanna7/31/2021  . Hypoglycemia   . Family history of breast cancer 05/23/2019  . Family history of colon cancer 05/23/2019  . Abnormal LFTs 04/18/2019  . Esophageal dysphagia   . Gastrointestinal tract imaging abnormality   .  Trochanteric bursitis, left hip 03/17/2019  . Malignant neoplasm of vulva, unspecified ([place]) 03/10/2019  . Peripheral vascular disease, unspecified ([place]) 03/10/2019  . Paroxysmal supraventricular tachycardia ([place]) 03/10/2019  . Contusion of left hip 03/06/2019  . Chest pain, moderate coronary artery risk 01/27/2019  . Melena   . Recurrent chest pain 01/19/2019  . Depression, major, single episode, moderate ([place]) 12/09/2018  . Closed nondisplaced intertrochanteric fracture of left femur ([place]) 10/12/2018  . Hypotension  06/26/2018  . Autonomic neuropathy 03/24/2018  . Acute delirium 03/03/2018  . H/O gastric bypass 03/02/2018  . Hypertension associated with stage 3 chronic kidney disease due to type 2 diabetes mellitus ([place]) 02/20/2018  . SI (sacroiliac) joint dysfunction 12/02/2017  . Unstable angina ([place]) 06/24/2017  . Syncope 04/09/2017  . Insomnia 03/18/2017  . Ischemic cardiomyopathy   . Arthritis   . Anxiety   . Tendinitis of upper biceps tendon of right shoulder 03/16/2017  . Hx of colonic polyps   . H/O medication noncompliance 12/14/2015  . CAD (coronary artery disease)   . Hypertensive heart disease   . Status post bariatric surgery 06/04/2015  . Carotid stenosis 04/30/2015  . Stable angina pectoris ([place]) 04/17/2015  . Iron deficiency anemia 03/22/2015  . Vitamin B12 deficiency 02/18/2015  . Misuse of medications for pain 02/18/2015  . Major depressive disorder, recurrent, severe with psychotic features ([place]) 02/15/2015  . Helicobacter pylori infection 11/23/2014  . Malignant melanoma ([place]) 08/25/2014  . Chronic systolic CHF (congestive heart failure) ([place])   . Incomplete bladder emptying 07/12/2014  . Hypothyroidism 12/30/2013  . Aberrant subclavian artery 11/17/2013  . Multiple sclerosis ([place]) 11/02/2013  . History of CVA with residual deficit 06/20/2013  . Headache, migraine 05/29/2013  . Hyperlipidemia   . GERD (gastroesophageal reflux disease)   . Neuropathy ([place]) 01/02/2011  . Stroke ([place]) 06/21/2008  . Depression with anxiety 05/01/2008  . Essential hypertension 05/01/2008    Past Surgical History:  Procedure Laterality Date  . APPENDECTOMY    . CARDIAC CATHETERIZATION N/A 11/09/2014   Procedure: Coronary Angiography;  Surgeon: Minna Merritts, MD;  Location: [place] CV LAB;  Service: Cardiovascular;  Laterality: N/A;  . CARDIAC CATHETERIZATION N/A 11/12/2014   Procedure: Coronary Stent Intervention;  Surgeon: Isaias Cowman, MD;  Location: [place] CV  LAB;  Service: Cardiovascular;  Laterality: N/A;  . CARDIAC CATHETERIZATION N/A 04/18/2015   Procedure: Left Heart Cath and Coronary Angiography;  Surgeon: Minna Merritts, MD;  Location: [place] [facility] LAB;  Service: Cardiovascular;  Laterality: N/A;  . CARDIAC CATHETERIZATION Left 06/04/2015   Procedure: Left Heart Cath and Coronary Angiography;  Surgeon: Wellington Hampshire, MD;  Location: [place] CV LAB;  Service: Cardiovascular;  Laterality: Left;  . CARDIAC CATHETERIZATION N/A 06/04/2015   Procedure: Coronary Stent Intervention;  Surgeon: Wellington Hampshire, MD;  Location: [facility];  Service: Cardiovascular;  Laterality: N/A;  . CESAREAN SECTION  2001  . CHOLECYSTECTOMY N/A 11/18/2016   Procedure: LAPAROSCOPIC CHOLECYSTECTOMY WITH INTRAOPERATIVE CHOLANGIOGRAM;  Surgeon: Christene Lye, MD;  Location: [facility];  Service: General;  Laterality: N/A;  . COLONOSCOPY WITH PROPOFOL N/A 04/27/2016   Procedure: COLONOSCOPY WITH PROPOFOL;  Surgeon: [name], MD;  Location: [place];  Service: Endoscopy;  Laterality: N/A;  . COLONOSCOPY WITH PROPOFOL N/A 01/12/2018   Procedure: COLONOSCOPY WITH PROPOFOL;  Surgeon: Toledo, Benay Pike, MD;  Location: [facility];  Service: Endoscopy;  Laterality: N/A;  . CORONARY ANGIOPLASTY    . CORONARY BALLOON ANGIOPLASTY  N/A 06/29/2017   Procedure: CORONARY BALLOON ANGIOPLASTY;  Surgeon: Wellington Hampshire, MD;  Location: [place] CV LAB;  Service: Cardiovascular;  Laterality: N/A;  . CORONARY BALLOON ANGIOPLASTY N/A 09/20/2017   Procedure: CORONARY BALLOON ANGIOPLASTY;  Surgeon: Wellington Hampshire, MD;  Location: [place] [facility] LAB;  Service: Cardiovascular;  Laterality: N/A;  . CORONARY STENT INTERVENTION N/A 12/13/2019   Procedure: CORONARY STENT INTERVENTION;  Surgeon: Wellington Hampshire, MD;  Location: [place] CV LAB;  Service: Cardiovascular;  Laterality: N/A;  . DILATION AND CURETTAGE OF UTERUS    .  ESOPHAGOGASTRODUODENOSCOPY (EGD) WITH PROPOFOL N/A 09/14/2014   Procedure: ESOPHAGOGASTRODUODENOSCOPY (EGD) WITH PROPOFOL;  Surgeon: Josefine Class, MD;  Location[facility];  Service: Endoscopy;  Laterality: N/A;  . ESOPHAGOGASTRODUODENOSCOPY (EGD) WITH PROPOFOL N/A 04/27/2016   Procedure: ESOPHAGOGASTRODUODENOSCOPY (EGD) WITH PROPOFOL;  Surgeon: [name], MD;  Location: [place];  Service: Endoscopy;  Laterality: N/A;  Diabetic - oral meds  . ESOPHAGOGASTRODUODENOSCOPY (EGD) WITH PROPOFOL N/A 01/12/2018   Procedure: ESOPHAGOGASTRODUODENOSCOPY (EGD) WITH PROPOFOL;  Surgeon: Tol[place], Benay Pike, MD;  Location: [facility];  Service: Endoscopy;  Laterality: N/A;  . ESOPHAGOGASTRODUODENOSCOPY (EGD) WITH PROPOFOL N/A 04/11/2019   Procedure: ESOPHAGOGASTRODUODENOSCOPY (EGD) WITH PROPOFOL;  Surgeon: [name], MD;  Location: [facility];  Service: Endoscopy;  Laterality: N/A;  . GASTRIC BYPASS  09/2009   [place] (IM) NAIL INTERTROCHANTERIC Left 10/13/2018   Procedure: INTRAMEDULLARY (IM) NAIL INTERTROCHANTRIC;  Surgeon: Leandrew Koyanagi, MD;  Location: [place];  Service: Orthopedics;  Laterality: Left;  . Left Carotid to sublcavian artery bypass w/ subclavian artery ligation     a. Performed @ [place].  . LEFT HEART CATH AND CORONARY ANGIOGRAPHY Left 06/29/2017   Procedure: LEFT HEART CATH AND CORONARY ANGIOGRAPHY;  Surgeon: Wellington Hampshire, MD;  Location: [place] CV LAB;  Service: Cardiovascular;  Laterality: Left;  . LEFT HEART CATH AND CORONARY ANGIOGRAPHY N/A 09/20/2017   Procedure: LEFT HEART CATH AND CORONARY ANGIOGRAPHY;  Surgeon: Wellington Hampshire, MD;  Location: [place] CV LAB;  Service: Cardiovascular;  Laterality: N/A;  . LEFT HEART CATH AND CORONARY ANGIOGRAPHY N/A 12/20/2017   Procedure: LEFT HEART CATH AND CORONARY ANGIOGRAPHY;  Surgeon: Wellington Hampshire, MD;  Location: [place] [facility];  Service: Cardiovascular;   Laterality: N/A;  . LEFT HEART CATH AND CORONARY ANGIOGRAPHY N/A 04/20/2019   Procedure: LEFT HEART CATH AND CORONARY ANGIOGRAPHY possible PCI;  Surgeon: Minna Merritts, MD;  Location: [place] [facility];  Service: Cardiovascular;  Laterality: N/A;  . LEFT HEART CATH AND CORONARY ANGIOGRAPHY N/A 12/13/2019   Procedure: LEFT HEART CATH AND CORONARY ANGIOGRAPHY;  Surgeon: Wellington Hampshire, MD;  Location: [place] [facility];  Service: Cardiovascular;  Laterality: N/A;  . MELANOMA EXCISION  2016   Dr. Evorn Gong  . [place]  2002  . RIGHT OOPHORECTOMY    . SHOULDER ARTHROSCOPY WITH OPEN ROTATOR CUFF REPAIR Right 01/07/2016   Procedure: SHOULDER ARTHROSCOPY WITH DEBRIDMENT, SUBACHROMIAL DECOMPRESSION;  Surgeon: Corky Mull, MD;  Location: [facility];  Service: Orthopedics;  Laterality: Right;  . SHOULDER ARTHROSCOPY WITH OPEN ROTATOR CUFF REPAIR Right 03/16/2017   Procedure: SHOULDER ARTHROSCOPY WITH OPEN ROTATOR CUFF REPAIR POSSIBLE BICEPS TENODESIS;  Surgeon: Corky Mull, MD;  Location: [facility];  Service: Orthopedics;  Laterality: Right;  . TRIGGER FINGER RELEASE Right     [place]    Prior to Admission medications   Medication Sig Start  Date End Date Taking? Authorizing Provider  ALPRAZolam Duanne Moron) 1 MG tablet Take 1 tablet by mouth twice daily as needed Patient taking differently: Take 1 mg by mouth 2 (two) times daily as needed for anxiety.  08/14/19   [name], MD  amLODipine (NORVASC) 2.5 MG tablet Take 1 tablet (2.5 mg total) by mouth daily. Patient taking differently: Take 2.5 mg by mouth every evening.  10/25/19   Minna Merritts, MD  aspirin EC 81 MG tablet Take 1 tablet (81 mg total) by mouth daily. Swallow whole. 12/07/19   Wellington Hampshire, MD  Biotin w/ Vitamins C & E (HAIR SKIN & NAILS GUMMIES PO) Take 4 tablets by mouth daily.     [provider]  buPROPion (WELLBUTRIN SR) 200 MG 12 hr tablet Take 200 mg by mouth every morning. 11/17/19    [provider]  carvedilol (COREG) 12.5 MG tablet Take 1 tablet (12.5 mg total) by mouth 2 (two) times daily with a meal. 10/20/19   Dwyane Dee, MD  clopidogrel (PLAVIX) 75 MG tablet Take 1 tablet (75 mg total) by mouth daily. 12/22/19   Loel Dubonnet, NP  desvenlafaxine (PRISTIQ) 50 MG 24 hr tablet Take 50 mg by mouth daily. 12/11/19   [provider]  doxycycline (VIBRA-TABS) 100 MG tablet Take 1 tablet (100 mg total) by mouth 2 (two) times daily for 10 days. 01/15/20 01/25/20  [name], MD  EUTHYROX 25 MCG tablet TAKE 1 TABLET BY MOUTH ONCE DAILY BEFORE BREAKFAST 12/02/19   [name], MD  FIBER COMPLETE PO Take 3 capsules by mouth daily.     [provider]  gabapentin (NEURONTIN) 300 MG capsule Take 2 capsules (600 mg total) by mouth 2 (two) times daily. 01/17/20   [name], MD  isosorbide mononitrate (IMDUR) 120 MG 24 hr tablet Take 1 tablet (120 mg total) by mouth daily. 10/21/19   Dwyane Dee, MD  levothyroxine (SYNTHROID) 25 MCG tablet Take 25 mcg by mouth daily before breakfast.    [provider]  Multiple Vitamin (MULTIVITAMIN) capsule Take 1 capsule by mouth daily. gummies     [provider]  nitroGLYCERIN (NITROSTAT) 0.4 MG SL tablet Place 1 tablet (0.4 mg total) under the tongue every 5 (five) minutes as needed for chest pain. 10/20/19   Dwyane Dee, MD  pantoprazole (PROTONIX) 40 MG tablet Take 1 tablet by mouth twice daily Patient taking differently: Take 40 mg by mouth 2 (two) times daily.  06/06/19   [name], MD  Polyethyl Glycol-Propyl Glycol (SYSTANE OP) Place 1 drop into both eyes daily as needed (dry eyes).    [provider]  promethazine (PHENERGAN) 25 MG tablet Take 25 mg by mouth every 6 (six) hours as needed for nausea or vomiting.  07/25/19   [provider]  rosuvastatin (CRESTOR) 40 MG tablet Take 1 tablet (40 mg total) by mouth daily at 6 PM. 10/25/19   Gollan, Kathlene November, MD  traZODone (DESYREL) 100 MG tablet TAKE 1 TO 2 TABLETS BY MOUTH AT BEDTIME Patient taking differently: Take 100-200 mg by mouth at bedtime as needed for sleep.  11/15/19   [name], MD  UBRELVY 100 MG TABS Take 100 mg by mouth 2 (two) times daily as needed (migraine).  09/13/19   [provider]    Allergies Lipitor [atorvastatin] and Tramadol  Family History  Problem Relation Age of Onset  . Hypertension Mother   .  Anxiety disorder Mother   . Depression Mother   . Bipolar disorder Mother   . Heart disease Mother        No details  . Hyperlipidemia Mother   . Kidney disease Father   . Heart disease Father 34  . Hypertension Father   . Diabetes Father   . Stroke Father   . Colon cancer Father        dx in his 60's  . Anxiety disorder Father   . Depression Father   . Skin cancer Father   . Kidney disease Sister   . Thyroid nodules Sister   . Hypertension Sister   . Hypertension Sister   . Diabetes Sister   . Hyperlipidemia Sister   . Depression Sister   . Breast cancer Maternal Aunt 53  . Breast cancer Maternal Aunt 27  . Ovarian cancer Cousin   . Colon cancer Cousin   . Kidney cancer Cousin   . Breast cancer Other   . Bladder Cancer Neg Hx     Social History Social History   Tobacco Use  . Smoking status: Former Smoker    Packs/day: 0.25    Years: 1.00    Pack years: 0.25    Types: Cigarettes    Quit date: 08/31/1994    Years since quitting: 25.4  . Smokeless tobacco: Never Used  . Tobacco comment: quit 28 years ago  Vaping Use  . Vaping Use: Never used  Substance Use Topics  . Alcohol use: No    Alcohol/week: 0.0 standard drinks  . Drug use: No    Review of Systems Constitutional: No fever/chills Eyes: No visual changes. ENT: No sore throat. Cardiovascular: Denies chest pain. Respiratory: Denies shortness of breath. Gastrointestinal: No abdominal pain.  Endorses nausea/vomiting/diarrhea Genitourinary: Negative for  dysuria. Musculoskeletal: Negative for acute arthralgias Skin: Negative for rash. Neurological: Negative for headaches, weakness/numbness/paresthesias in any extremity Psychiatric: Negative for suicidal ideation/homicidal ideation   ____________________________________________   PHYSICAL EXAM:  VITAL SIGNS: ED Triage Vitals  Enc Vitals Group     BP 01/25/20 0911 107/76     Pulse Rate 01/25/20 0911 83     Resp 01/25/20 0911 20     Temp 01/25/20 0911 97.8 F (36.6 C)     Temp Source 01/25/20 0911 Oral     SpO2 01/25/20 0911 100 %     Weight 01/25/20 0908 148 lb (67.1 kg)     Height 01/25/20 0908 '5\' 3"'  (1.6 m)     Head Circumference --      Peak Flow --      Pain Score 01/25/20 0908 9     Pain Loc --      Pain Edu? --      Excl. in [place]? --    Constitutional: Alert and oriented. Well appearing and in no acute distress. Eyes: Conjunctivae are normal. PERRL. Head: Atraumatic. Nose: No congestion/rhinnorhea. Mouth/Throat: Mucous membranes are moist. Neck: No stridor Cardiovascular: Grossly normal heart sounds.  Good peripheral circulation. Respiratory: Normal respiratory effort.  No retractions. Gastrointestinal: Soft and nontender. No distention. Musculoskeletal: No obvious deformities Neurologic:  Normal speech and language. No gross focal neurologic deficits are appreciated. Skin:  Skin is warm and dry. No rash noted. Psychiatric: Mood and affect are normal. Speech and behavior are normal.  ____________________________________________   LABS (all labs ordered are listed, but only abnormal results are displayed)  Labs Reviewed  COMPREHENSIVE METABOLIC PANEL - Abnormal; Notable for the following components:  Result Value   CO2 21 (*)    Glucose, Bld 122 (*)    BUN 49 (*)    Creatinine, Ser 2.30 (*)    Calcium 8.2 (*)    Total Protein 5.9 (*)    Albumin 3.0 (*)    GFR, Estimated 25 (*)    All other components within normal limits  CBC - Abnormal; Notable for  the following components:   WBC 12.7 (*)    Hemoglobin 10.5 (*)    HCT 34.2 (*)    All other components within normal limits  GASTROINTESTINAL PANEL BY PCR, STOOL (REPLACES STOOL CULTURE)  C DIFFICILE (CDIFF) QUICK SCRN (NO PCR REFLEX)  LIPASE, BLOOD   ____________________________________________  EKG  ED ECG REPORT I, Naaman Plummer, the attending physician, personally viewed and interpreted this ECG.  Date: 01/25/2020 EKG Time: 0913 Rate: 85 Rhythm: normal sinus rhythm QRS Axis: normal Intervals: normal ST/T Wave abnormalities: normal Narrative Interpretation: no evidence of acute ischemia  PROCEDURES  Procedure(s) performed (including Critical Care):  .1-3 Lead EKG Interpretation Performed by: Naaman Plummer, MD Authorized by: Naaman Plummer, MD     Interpretation: normal     ECG rate:  83   ECG rate assessment: normal     Rhythm: sinus rhythm     Ectopy: none     Conduction: normal       ____________________________________________   INITIAL IMPRESSION / ASSESSMENT AND PLAN / ED COURSE  As part of my medical decision making, I reviewed the following data within the [facility] notes reviewed and incorporated, Labs reviewed, EKG interpreted, Old chart reviewed, Radiograph reviewed and Notes from prior ED visits reviewed and incorporated     Patient a 49 year old female with above-stated past medical history presents for nausea/vomiting/diarrhea over the past 2 weeks that have now turned into dark stools concerning for GI bleeding.  Differential diagnosis includes but is not limited to: GI bleeding, hemorrhoidal bleeding, symptomatic anemia, aortoenteric fistula  Patient has had a 1 g decrease in her hemoglobin over the past 2 weeks down to 10.5 today. Despite multiple antinausea medications, patient has continued to have significant vomiting but without any evidence of coffee grounds.  Attempts were made to collect a sample of this  vomitus but they were thrown in the trash prior to collection of the samples.  Patient and son at bedside was informed to not throw away any further samples of vomitus so that we can test them.  They expressed understanding. Given patient's symptomatic anemia, intractable nausea/vomiting, and evidence of GI bleeding, she will require admission to the internal medicine service for further evaluation and management.  I spoke to Dr. Francine Graven, and internal medicine who agrees to accept this patient onto her service.  I also spoke to Dr. Bonna Gains in gastroenterology who agrees to evaluate this patient as well.       ____________________________________________   FINAL CLINICAL IMPRESSION(S) / ED DIAGNOSES  Final diagnoses:  None     ED Discharge Orders    None       Note:  This document was prepared using Dragon voice recognition software and may include unintentional dictation errors.   Naaman Plummer, MD 01/25/20 1455

## 2020-01-25 NOTE — H&P (Addendum)
History and Physical    [name] UYQ:[id number] DOB: [birth year] DOA: 01/25/2020  PCP: [name], MD   Patient coming from: Home  I have personally briefly reviewed patient's old medical records in [place]  Chief Complaint: Diarrhea  HPI: [name] is a 49 y.o. female with medical history significant for coronary artery disease, diabetes mellitus with complications of stage III chronic kidney disease, history of CVA with left-sided weakness, depression who presents to the emergency room for evaluation of dizziness, lightheadedness and feeling like she was going to pass out.  Patient states that she had gone for cardiac rehab and after her exercise on her way out she suddenly became dizzy and lightheaded and felt like she was going to pass out and so requested for wheelchair.  She states that her blood pressure was low but is unable to tell me what it was.  She states that she has had diarrhea for 21/2 weeks and was recently on antibiotics for a UTI.  On the day of admission she noticed that her stools have been dark and remain loose.  This was concerning to her. She has had nausea and some episodes of emesis. She has abdominal pain mostly in the periumbilical and epigastric area.  She rates her pain a 5 x 10 in intensity at its worst and pain is non radiating.  She denies any NSAID use. She denies having any hematochezia or hematemesis.  She denies having any chest pain, no shortness of breath, no fever, no chills, no urinary frequency, no nocturia, no dysuria, no palpitations, no diaphoresis. Labs show sodium 135, potassium 5.0, chloride 105, bicarb 21, glucose 122, BUN 49, creatinine 2.30, calcium 8.2, alkaline phosphatase 91, albumin 3.0, lipase 35, AST 20, ALT 18, total protein 5.9, white count 12.7, hemoglobin 10.5, hematocrit 34.2, MCV 85.1, RDW 13.2, platelet count 276 CT scan of abdomen and pelvis shows status post gastric bypass. Distension of the  excluded stomach, reflecting some degree of functional obstruction. Associated 3.5 x 4.2 cm intramural high density lesion in the proximal duodenum, favoring intraluminal duodenal hemorrhage. A pre-existing mass is not identified on the prior MR, although an underlying ulcer or small lesion is certainly possible. Given the patient's postsurgical anatomy, it is unclear that direct visualization of this region is possible endoscopically. If this is not possible, consider follow-up MR enterography in 4 weeks. Twelve-lead EKG shows normal sinus rhythm with nonspecific ST/T wave changes. Respiratory viral panel still pending  ED Course: Patient is a 49 year old female who presents to the ER for evaluation of a near syncopal episode as well as a 2 and half week history of diarrhea with melena stools.  Patient was said to be hypotensive but there is no documentation of her blood pressures when she arrived.  She received 2 L of normal saline with improvement in her blood pressure.  Her stools are heme positive and hemoglobin is 10.5g/dl , slightly lower than her baseline.  She will be admitted to the hospital for further evaluation.  Review of Systems: As per HPI otherwise all systems reviewed and negative.    Past Medical History:  Diagnosis Date  . Acute colitis 01/27/2017  . Acute pyelonephritis   . Anemia    iron deficiency anemia  . Aortic arch aneurysm ([place])   . BRCA negative 2014  . CAD (coronary artery disease)    a. 08/2003 Cath: LAD 30-40-med Rx; b. 11/2014 PCI: LAD 16m(3.25x23 Xience Alpine DES); c.  06/2015 PCI: D1 (2.25x12 Resolute Integrity DES); d. 06/2017 PCI: Patent mLAD stent, D2 95 (PTCA); e. 09/2017 PCI: D2 99ost (CBA); d. 12/2017 Cath: LM nl, LAD 56m 80d (small), D1 40ost, D2 95ost, LCX 40p, RCA 40ost/p->Med rx for D2 given restenosis.  . Colitis 06/03/2015  . Colon polyp   . CVA (cerebral vascular accident) ([place]    Left side weakness.   . Degenerative tear of glenoid labrum of  right shoulder 03/15/2017  . Diabetes mellitus without complication ([place]   . Family history of breast cancer    BRCA neg 2014  . Femur fracture, left ([place] 10/12/2018  . Gastric ulcer 04/27/2011  . History of echocardiogram    a. 03/2017 Echo: EF 60-65%, no rwma; b. 02/2018 Echo: EF 60-65%, no rwma. Nl RV fxn. No cardiac source of emboli (admitted w/ stroke).  . Hypertension   . Malignant melanoma of skin of scalp ([place]   . MI, acute, non ST segment elevation ([place]   . Neuromuscular disorder ([place]   . S/P drug eluting coronary stent placement 06/04/2015  . Sepsis ([place] 03/18/2017  . Stroke ([facility]    a. 02/2018 MRI: 740mlate acute/early subacute L medial frontal lobe inarct; b. 02/2018 MRA No large vessel occlusion or aneurysm. Mod to sev L P2 stenosis. thready L vertebral artery, diffusely dzs'd; c. 02/2018 Carotid U/S: <50% bilat ICA dzs.    Past Surgical History:  Procedure Laterality Date  . APPENDECTOMY    . CARDIAC CATHETERIZATION N/A 11/09/2014   Procedure: Coronary Angiography;  Surgeon: TiMinna MerrittsMD;  [facility];  Service: Cardiovascular;  Laterality: N/A;  . CARDIAC CATHETERIZATION N/A 11/12/2014   Procedure: Coronary Stent Intervention;  Surgeon: AlIsaias CowmanMD;  [facility];  Service: Cardiovascular;  Laterality: N/A;  . CARDIAC CATHETERIZATION N/A 04/18/2015   Procedure: Left Heart Cath and Coronary Angiography;  Surgeon: TiMinna MerrittsMD;  [facility];  Service: Cardiovascular;  Laterality: N/A;  . CARDIAC CATHETERIZATION Left 06/04/2015   Procedure: Left Heart Cath and Coronary Angiography;  Surgeon: MuWellington HampshireMD;  [facility];  Service: Cardiovascular;  Laterality: Left;  . CARDIAC CATHETERIZATION N/A 06/04/2015   Procedure: Coronary Stent Intervention;  Surgeon: MuWellington HampshireMD;  [facility];  Service: Cardiovascular;  Laterality: N/A;  . CESAREAN SECTION   2001  . CHOLECYSTECTOMY N/A 11/18/2016   Procedure: LAPAROSCOPIC CHOLECYSTECTOMY WITH INTRAOPERATIVE CHOLANGIOGRAM;  Surgeon: SaChristene LyeMD;  [facility];  Service: General;  Laterality: N/A;  . COLONOSCOPY WITH PROPOFOL N/A 04/27/2016   Procedure: COLONOSCOPY WITH PROPOFOL;  Surgeon: DaLucilla LameMD;  Location: [facility]: Endoscopy;  Laterality: N/A;  . COLONOSCOPY WITH PROPOFOL N/A 01/12/2018   Procedure: COLONOSCOPY WITH PROPOFOL;  Surgeon: Toledo, TeBenay PikeMD;  [facility];  Service: Endoscopy;  Laterality: N/A;  . CORONARY ANGIOPLASTY    . CORONARY BALLOON ANGIOPLASTY N/A 06/29/2017   Procedure: CORONARY BALLOON ANGIOPLASTY;  Surgeon: ArWellington HampshireMD;  [facility];  Service: Cardiovascular;  Laterality: N/A;  . CORONARY BALLOON ANGIOPLASTY N/A 09/20/2017   Procedure: CORONARY BALLOON ANGIOPLASTY;  Surgeon: ArWellington HampshireMD;  [facility];  Service: Cardiovascular;  Laterality: N/A;  . CORONARY STENT INTERVENTION N/A 12/13/2019   Procedure: CORONARY STENT INTERVENTION;  Surgeon: ArWellington HampshireMD;  [facility];  Service: Cardiovascular;  Laterality: N/A;  .  DILATION AND CURETTAGE OF UTERUS    . ESOPHAGOGASTRODUODENOSCOPY (EGD) WITH PROPOFOL N/A 09/14/2014   Procedure: ESOPHAGOGASTRODUODENOSCOPY (EGD) WITH PROPOFOL;  Surgeon: Josefine Class, MD;  Location: [facility] ENDOSCOPY;  Service: Endoscopy;  Laterality: N/A;  . ESOPHAGOGASTRODUODENOSCOPY (EGD) WITH PROPOFOL N/A 04/27/2016   Procedure: ESOPHAGOGASTRODUODENOSCOPY (EGD) WITH PROPOFOL;  Surgeon: [name], MD;  Location: [facility];  Service: Endoscopy;  Laterality: N/A;  Diabetic - oral meds  . ESOPHAGOGASTRODUODENOSCOPY (EGD) WITH PROPOFOL N/A 01/12/2018   Procedure: ESOPHAGOGASTRODUODENOSCOPY (EGD) WITH PROPOFOL;  Surgeon: Toledo, Benay Pike, MD;  [facility];  Service: Endoscopy;  Laterality: N/A;   . ESOPHAGOGASTRODUODENOSCOPY (EGD) WITH PROPOFOL N/A 04/11/2019   Procedure: ESOPHAGOGASTRODUODENOSCOPY (EGD) WITH PROPOFOL;  Surgeon: [name], MD;  [facility];  Service: Endoscopy;  Laterality: N/A;  . GASTRIC BYPASS  09/2009   [place] (IM) NAIL INTERTROCHANTERIC Left 10/13/2018   Procedure: INTRAMEDULLARY (IM) NAIL INTERTROCHANTRIC;  Surgeon: Leandrew Koyanagi, MD;  Location: [facility];  Service: Orthopedics;  Laterality: Left;  . Left Carotid to sublcavian artery bypass w/ subclavian artery ligation     a. Performed @ [facility].  . LEFT HEART CATH AND CORONARY ANGIOGRAPHY Left 06/29/2017   Procedure: LEFT HEART CATH AND CORONARY ANGIOGRAPHY;  Surgeon: Wellington Hampshire, MD;  Location: [place] [facility] LAB;  Service: Cardiovascular;  Laterality: Left;  . LEFT HEART CATH AND CORONARY ANGIOGRAPHY N/A 09/20/2017   Procedure: LEFT HEART CATH AND CORONARY ANGIOGRAPHY;  Surgeon: Wellington Hampshire, MD;  Location: [place] [facility] LAB;  Service: Cardiovascular;  Laterality: N/A;  . LEFT HEART CATH AND CORONARY ANGIOGRAPHY N/A 12/20/2017   Procedure: LEFT HEART CATH AND CORONARY ANGIOGRAPHY;  Surgeon: Wellington Hampshire, MD;  Location: [place] CV LAB;  Service: Cardiovascular;  Laterality: N/A;  . LEFT HEART CATH AND CORONARY ANGIOGRAPHY N/A 04/20/2019   Procedure: LEFT HEART CATH AND CORONARY ANGIOGRAPHY possible PCI;  Surgeon: Minna Merritts, MD;  Location: [place] CV LAB;  Service: Cardiovascular;  Laterality: N/A;  . LEFT HEART CATH AND CORONARY ANGIOGRAPHY N/A 12/13/2019   Procedure: LEFT HEART CATH AND CORONARY ANGIOGRAPHY;  Surgeon: Wellington Hampshire, MD;  Location: [place] CV LAB;  Service: Cardiovascular;  Laterality: N/A;  . MELANOMA EXCISION  2016   Dr. Evorn Gong  . [place]  2002  . RIGHT OOPHORECTOMY    . SHOULDER ARTHROSCOPY WITH OPEN ROTATOR CUFF REPAIR Right 01/07/2016   Procedure: SHOULDER ARTHROSCOPY WITH DEBRIDMENT,  SUBACHROMIAL DECOMPRESSION;  Surgeon: Corky Mull, MD;  [facility];  Service: Orthopedics;  Laterality: Right;  . SHOULDER ARTHROSCOPY WITH OPEN ROTATOR CUFF REPAIR Right 03/16/2017   Procedure: SHOULDER ARTHROSCOPY WITH OPEN ROTATOR CUFF REPAIR POSSIBLE BICEPS TENODESIS;  Surgeon: Corky Mull, MD;  [facility]: [facility];  Service: Orthopedics;  Laterality: Right;  . TRIGGER FINGER RELEASE Right     [place]     reports that she quit smoking about 25 years ago. Her smoking use included cigarettes. She has a 0.25 pack-year smoking history. She has never used smokeless tobacco. She reports that she does not drink alcohol and does not use drugs.  Allergies  Allergen Reactions  . Lipitor [Atorvastatin] Other (See Comments)    Leg pains  . Tramadol Other (See Comments)    Mouth feels like it's on fire    Family History  Problem Relation Age of Onset  . Hypertension Mother   . Anxiety disorder Mother   . Depression Mother   .  Bipolar disorder Mother   . Heart disease Mother        No details  . Hyperlipidemia Mother   . Kidney disease Father   . Heart disease Father 67  . Hypertension Father   . Diabetes Father   . Stroke Father   . Colon cancer Father        dx in his 75's  . Anxiety disorder Father   . Depression Father   . Skin cancer Father   . Kidney disease Sister   . Thyroid nodules Sister   . Hypertension Sister   . Hypertension Sister   . Diabetes Sister   . Hyperlipidemia Sister   . Depression Sister   . Breast cancer Maternal Aunt 40  . Breast cancer Maternal Aunt 27  . Ovarian cancer Cousin   . Colon cancer Cousin   . Kidney cancer Cousin   . Breast cancer Other   . Bladder Cancer Neg Hx      Prior to Admission medications   Medication Sig Start Date End Date Taking? Authorizing Provider  ALPRAZolam Duanne Moron) 1 MG tablet Take 1 tablet by mouth twice daily as needed Patient taking differently: Take 1 mg by mouth 2 (two) times daily as needed for  anxiety.  08/14/19   [name], MD  amLODipine (NORVASC) 2.5 MG tablet Take 1 tablet (2.5 mg total) by mouth daily. Patient taking differently: Take 2.5 mg by mouth every evening.  10/25/19   Minna Merritts, MD  aspirin EC 81 MG tablet Take 1 tablet (81 mg total) by mouth daily. Swallow whole. 12/07/19   Wellington Hampshire, MD  Biotin w/ Vitamins C & E (HAIR SKIN & NAILS GUMMIES PO) Take 4 tablets by mouth daily.     [provider]  buPROPion (WELLBUTRIN SR) 200 MG 12 hr tablet Take 200 mg by mouth every morning. 11/17/19   [provider]  carvedilol (COREG) 12.5 MG tablet Take 1 tablet (12.5 mg total) by mouth 2 (two) times daily with a meal. 10/20/19   Dwyane Dee, MD  clopidogrel (PLAVIX) 75 MG tablet Take 1 tablet (75 mg total) by mouth daily. 12/22/19   Loel Dubonnet, NP  desvenlafaxine (PRISTIQ) 50 MG 24 hr tablet Take 50 mg by mouth daily. 12/11/19   [provider]  doxycycline (VIBRA-TABS) 100 MG tablet Take 1 tablet (100 mg total) by mouth 2 (two) times daily for 10 days. 01/15/20 01/25/20  [name], MD  EUTHYROX 25 MCG tablet TAKE 1 TABLET BY MOUTH ONCE DAILY BEFORE BREAKFAST 12/02/19   [name], MD  FIBER COMPLETE PO Take 3 capsules by mouth daily.     [provider]  gabapentin (NEURONTIN) 300 MG capsule Take 2 capsules (600 mg total) by mouth 2 (two) times daily. 01/17/20   [name], MD  isosorbide mononitrate (IMDUR) 120 MG 24 hr tablet Take 1 tablet (120 mg total) by mouth daily. 10/21/19   Dwyane Dee, MD  levothyroxine (SYNTHROID) 25 MCG tablet Take 25 mcg by mouth daily before breakfast.    [provider]  Multiple Vitamin (MULTIVITAMIN) capsule Take 1 capsule by mouth daily. gummies     [provider]  nitroGLYCERIN (NITROSTAT) 0.4 MG SL tablet Place 1 tablet (0.4 mg total) under the tongue every 5 (five) minutes as needed for chest pain. 10/20/19   Dwyane Dee, MD  pantoprazole  (PROTONIX) 40 MG tablet Take 1 tablet by mouth twice daily Patient taking differently:  Take 40 mg by mouth 2 (two) times daily.  06/06/19   [name], MD  Polyethyl Glycol-Propyl Glycol (SYSTANE OP) Place 1 drop into both eyes daily as needed (dry eyes).    [provider]  promethazine (PHENERGAN) 25 MG tablet Take 25 mg by mouth every 6 (six) hours as needed for nausea or vomiting.  07/25/19   [provider]  rosuvastatin (CRESTOR) 40 MG tablet Take 1 tablet (40 mg total) by mouth daily at 6 PM. 10/25/19   Gollan, Kathlene November, MD  traZODone (DESYREL) 100 MG tablet TAKE 1 TO 2 TABLETS BY MOUTH AT BEDTIME Patient taking differently: Take 100-200 mg by mouth at bedtime as needed for sleep.  11/15/19   [name], MD  UBRELVY 100 MG TABS Take 100 mg by mouth 2 (two) times daily as needed (migraine).  09/13/19   [provider]    Physical Exam: Vitals:   01/25/20 0908 01/25/20 0911  BP:  107/76  Pulse:  83  Resp:  20  Temp:  97.8 F (36.6 C)  TempSrc:  Oral  SpO2:  100%  Weight: 67.1 kg   Height: '5\' 3"'  (1.6 m)      Vitals:   01/25/20 0908 01/25/20 0911  BP:  107/76  Pulse:  83  Resp:  20  Temp:  97.8 F (36.6 C)  TempSrc:  Oral  SpO2:  100%  Weight: 67.1 kg   Height: '5\' 3"'  (1.6 m)     Constitutional: NAD, alert and oriented x 3, chronically ill-appearing, looks older than her stated age Eyes: PERRL, lids and conjunctivae pallor ENMT: Mucous membranes are moist.  Neck: normal, supple, no masses, no thyromegaly Respiratory: clear to auscultation bilaterally, no wheezing, no crackles. Normal respiratory effort. No accessory muscle use.  Cardiovascular: Regular rate and rhythm, no murmurs / rubs / gallops. No extremity edema. 2+ pedal pulses. No carotid bruits.  Abdomen: Epigastric/periumbilical tenderness, no masses palpated. No hepatosplenomegaly. Bowel sounds positive.  Musculoskeletal: no clubbing / cyanosis. No joint deformity upper and  lower extremities.  Skin: no rashes, lesions, ulcers.  Neurologic: No gross focal neurologic deficit.  Generalized weakness Psychiatric: Normal mood and affect.   Labs on Admission: I have personally reviewed following labs and imaging studies  CBC: Recent Labs  Lab 01/25/20 0916  WBC 12.7*  HGB 10.5*  HCT 34.2*  MCV 85.1  PLT 604   Basic Metabolic Panel: Recent Labs  Lab 01/25/20 0916  NA 135  K 5.0  CL 105  CO2 21*  GLUCOSE 122*  BUN 49*  CREATININE 2.30*  CALCIUM 8.2*   GFR: Estimated Creatinine Clearance: 26.6 mL/min (A) (by C-G formula based on SCr of 2.3 mg/dL (H)). Liver Function Tests: Recent Labs  Lab 01/25/20 0916  AST 20  ALT 18  ALKPHOS 91  BILITOT 0.6  PROT 5.9*  ALBUMIN 3.0*   Recent Labs  Lab 01/25/20 0916  LIPASE 35   No results for input(s): AMMONIA in the last 168 hours. Coagulation Profile: No results for input(s): INR, PROTIME in the last 168 hours. Cardiac Enzymes: No results for input(s): CKTOTAL, CKMB, CKMBINDEX, TROPONINI in the last 168 hours. BNP (last 3 results) No results for input(s): PROBNP in the last 8760 hours. HbA1C: No results for input(s): HGBA1C in the last 72 hours. CBG: Recent Labs  Lab 01/23/20 0750 01/23/20 0848 01/25/20 0716 01/25/20 0820  GLUCAP 128* 88 123* 94   Lipid Profile: No results for input(s): CHOL, HDL, LDLCALC,  TRIG, CHOLHDL, LDLDIRECT in the last 72 hours. Thyroid Function Tests: No results for input(s): TSH, T4TOTAL, FREET4, T3FREE, THYROIDAB in the last 72 hours. Anemia Panel: No results for input(s): VITAMINB12, FOLATE, FERRITIN, TIBC, IRON, RETICCTPCT in the last 72 hours. Urine analysis:    Component Value Date/Time   COLORURINE YELLOW (A) 10/18/2019 0826   APPEARANCEUR HAZY (A) 10/18/2019 0826   APPEARANCEUR Cloudy (A) 07/17/2014 1455   LABSPEC 1.013 10/18/2019 0826   LABSPEC 1.012 12/29/2013 1210   PHURINE 5.0 10/18/2019 0826   GLUCOSEU NEGATIVE 10/18/2019 0826   GLUCOSEU  >=500 12/29/2013 1210   HGBUR NEGATIVE 10/18/2019 0826   HGBUR negative 04/14/2010 1435   BILIRUBINUR negative 01/09/2020 0927   BILIRUBINUR Negative 07/17/2014 1455   BILIRUBINUR Negative 12/29/2013 1210   KETONESUR NEGATIVE 10/18/2019 0826   PROTEINUR Negative 01/09/2020 0927   PROTEINUR NEGATIVE 10/18/2019 0826   UROBILINOGEN 0.2 01/09/2020 0927   UROBILINOGEN negative 04/14/2010 1435   NITRITE positive 01/09/2020 0927   NITRITE NEGATIVE 10/18/2019 0826   LEUKOCYTESUR Moderate (2+) (A) 01/09/2020 0927   LEUKOCYTESUR LARGE (A) 10/18/2019 0826   LEUKOCYTESUR Negative 12/29/2013 1210    Radiological Exams on Admission: CT Abdomen Pelvis Wo Contrast  Result Date: 01/25/2020 CLINICAL DATA:  Diarrhea, weakness, on Plavix/aspirin EXAM: CT ABDOMEN AND PELVIS WITHOUT CONTRAST TECHNIQUE: Multidetector CT imaging of the abdomen and pelvis was performed following the standard protocol without IV contrast. COMPARISON:  MRI abdomen dated 09/05/2019. CT abdomen/pelvis dated 03/19/2017. FINDINGS: Lower chest: Lung bases are clear. Hepatobiliary: Unenhanced liver is unremarkable. Status post cholecystectomy. No intrahepatic or extrahepatic ductal dilatation. Pancreas: Within normal limits. Spleen: Within normal limits. Adrenals/Urinary Tract: Adrenal glands are within normal limits. Kidneys are notable for renal vascular calcifications but are otherwise unremarkable. No hydronephrosis. Bladder is mildly thick-walled/trabeculated. Stomach/Bowel: Status post gastric bypass. Distension of the excluded stomach, reflecting some degree of functional obstruction, with an intramural high density lesion in the proximal duodenum measuring 3.5 x 4.2 cm (series 2/image 32), favoring intraluminal duodenal hemorrhage. A pre-existing mass is not identified (even in retrospect) when correlating with prior MR, although an underlying ulcer or small lesion is certainly possible. Bowel is otherwise unremarkable, without  evidence of small bowel obstruction. Appendix is not discretely visualized. Vascular/Lymphatic: No evidence of abdominal aortic aneurysm. Atherosclerotic calcifications of the abdominal aorta and branch vessels. No suspicious abdominopelvic lymphadenopathy. Reproductive: Uterus is within normal limits. Left ovary is within normal limits.  No right adnexal mass. Other: No abdominopelvic ascites. Musculoskeletal: Status post ORIF of the left hip. Very mild degenerative changes of the lumbar spine. IMPRESSION: Status post gastric bypass. Distension of the excluded stomach, reflecting some degree of functional obstruction. Associated 3.5 x 4.2 cm intramural high density lesion in the proximal duodenum, favoring intraluminal duodenal hemorrhage. A pre-existing mass is not identified on the prior MR, although an underlying ulcer or small lesion is certainly possible. Given the patient's postsurgical anatomy, it is unclear that direct visualization of this region is possible endoscopically. If this is not possible, consider follow-up MR enterography in 4 weeks. Electronically Signed   By: Julian Hy M.D.   On: 01/25/2020 14:38    EKG: Independently reviewed.  Normal sinus rhythm Nonspecific ST-T wave changes  Assessment/Plan Principal Problem:   Acute upper GI bleed Active Problems:   Depression with anxiety   Essential hypertension   Hypothyroidism   CAD (coronary artery disease)   Gastroenteritis   Near syncope   Diabetes mellitus ([name])   Dehydration  Acute upper GI bleed Unclear etiology Patient had a CT scan of abdomen and pelvis which showed 3.5 x 4.2 cm intramural high density lesion in the proximal duodenum, favoring intraluminal duodenal hemorrhage.  A pre-existing mass is not identified on the prior MR, although an underlying also small lesion is possible We will type and crossmatch and transfuse for hemoglobin less than 7g/dl Hemoglobin on admission is 10.5g/dl compared to  baseline of about 11.8g/dl 2 weeks ago. Place patient on Protonix drip Hold aspirin and Plavix We will request GI consult Obtain bleeding scan Check serial H&H     Near syncope Most likely related to dehydration from GI losses as well acute blood loss Patient noted to be dehydrated, her baseline serum creatinine is 1.86 and today on admission it is 2.30 Aggressive IV fluid resuscitation Place patient on a cardiac monitor to rule out arrhythmias   Gastroenteritis Rule out C. difficile diarrhea since patient was recently on antibiotic therapy Awaiting results of C. difficile toxin    Diabetes mellitus with complications of stage III chronic kidney disease We will keep patient n.p.o. for now Blood sugar checks every 4 hours Patient noted to have worsening of her renal function from GI losses due to diarrhea as well as GI bleed     History of coronary artery disease Hold aspirin and Plavix Continue statins once patient is able to resume oral intake Hold nitrates and beta-blockers due to relative hypotension     Hypothyroidism Continue Synthroid once patient is able to resume oral intake    Depression and anxiety Hold antidepressants    History of hypertension Hold all antihypertensive medications due to relative hypotension  DVT prophylaxis: SCD Code Status: Full code Family Communication: Greater than 50% of time was spent discussing patient's condition and plan of care with her at the bedside.  All questions and concerns have been addressed.  She verbalizes understanding and agrees with the plan. Disposition Plan: Back to previous home environment Consults called: Gastroenterology    Collier Bullock MD [facility]     01/25/2020, 3:15 PM

## 2020-01-26 ENCOUNTER — Encounter: Payer: Self-pay | Admitting: Internal Medicine

## 2020-01-26 DIAGNOSIS — K922 Gastrointestinal hemorrhage, unspecified: Principal | ICD-10-CM

## 2020-01-26 LAB — IRON AND TIBC
Iron: 43 ug/dL (ref 28–170)
Saturation Ratios: 25 % (ref 10.4–31.8)
TIBC: 169 ug/dL — ABNORMAL LOW (ref 250–450)
UIBC: 126 ug/dL

## 2020-01-26 LAB — CBG MONITORING, ED
Glucose-Capillary: 107 mg/dL — ABNORMAL HIGH (ref 70–99)
Glucose-Capillary: 57 mg/dL — ABNORMAL LOW (ref 70–99)
Glucose-Capillary: 45 mg/dL — ABNORMAL LOW (ref 70–99)

## 2020-01-26 LAB — HEMOGLOBIN AND HEMATOCRIT, BLOOD
HCT: 25.3 % — ABNORMAL LOW (ref 36.0–46.0)
HCT: 26.4 % — ABNORMAL LOW (ref 36.0–46.0)
HCT: 27.8 % — ABNORMAL LOW (ref 36.0–46.0)
HCT: 28.1 % — ABNORMAL LOW (ref 36.0–46.0)
Hemoglobin: 8.1 g/dL — ABNORMAL LOW (ref 12.0–15.0)
Hemoglobin: 8.2 g/dL — ABNORMAL LOW (ref 12.0–15.0)
Hemoglobin: 8.7 g/dL — ABNORMAL LOW (ref 12.0–15.0)
Hemoglobin: 8.9 g/dL — ABNORMAL LOW (ref 12.0–15.0)

## 2020-01-26 LAB — GLUCOSE, CAPILLARY
Glucose-Capillary: 103 mg/dL — ABNORMAL HIGH (ref 70–99)
Glucose-Capillary: 104 mg/dL — ABNORMAL HIGH (ref 70–99)
Glucose-Capillary: 124 mg/dL — ABNORMAL HIGH (ref 70–99)
Glucose-Capillary: 47 mg/dL — ABNORMAL LOW (ref 70–99)
Glucose-Capillary: 57 mg/dL — ABNORMAL LOW (ref 70–99)
Glucose-Capillary: 58 mg/dL — ABNORMAL LOW (ref 70–99)
Glucose-Capillary: 64 mg/dL — ABNORMAL LOW (ref 70–99)
Glucose-Capillary: 81 mg/dL (ref 70–99)
Glucose-Capillary: 84 mg/dL (ref 70–99)

## 2020-01-26 LAB — BASIC METABOLIC PANEL
Anion gap: 4 — ABNORMAL LOW (ref 5–15)
BUN: 55 mg/dL — ABNORMAL HIGH (ref 6–20)
CO2: 22 mmol/L (ref 22–32)
Calcium: 8.2 mg/dL — ABNORMAL LOW (ref 8.9–10.3)
Chloride: 115 mmol/L — ABNORMAL HIGH (ref 98–111)
Creatinine, Ser: 2.01 mg/dL — ABNORMAL HIGH (ref 0.44–1.00)
GFR, Estimated: 30 mL/min — ABNORMAL LOW (ref >60)
Glucose, Bld: 146 mg/dL — ABNORMAL HIGH (ref 70–99)
Potassium: 4.5 mmol/L (ref 3.5–5.1)
Sodium: 141 mmol/L (ref 135–145)

## 2020-01-26 LAB — FOLATE: Folate: 39 ng/mL (ref >5.9)

## 2020-01-26 LAB — FERRITIN: Ferritin: 27 ng/mL (ref 11–307)

## 2020-01-26 LAB — MRSA PCR SCREENING: MRSA by PCR: NEGATIVE

## 2020-01-26 LAB — VITAMIN B12: Vitamin B-12: 328 pg/mL (ref 180–914)

## 2020-01-26 MED ORDER — DEXTROSE 50 % IV SOLN
INTRAVENOUS | Status: AC
  Filled 2020-01-26: qty 50

## 2020-01-26 MED ORDER — DEXTROSE IN LACTATED RINGERS 5 % IV SOLN: INTRAVENOUS | Status: DC

## 2020-01-26 MED ORDER — DEXTROSE 50 % IV SOLN
25.0000 g | INTRAVENOUS | Status: AC
  Administered 2020-01-26: 08:00:00 25 g via INTRAVENOUS
  Filled 2020-01-26: qty 50

## 2020-01-26 MED ORDER — HYDROMORPHONE HCL 1 MG/ML IJ SOLN
1.0000 mg | Freq: Once | INTRAMUSCULAR | Status: AC
  Administered 2020-01-26: 02:00:00 1 mg via INTRAVENOUS
  Filled 2020-01-26: qty 1

## 2020-01-26 MED ORDER — SODIUM CHLORIDE 0.9 % IV SOLN
80.0000 mg | Freq: Once | INTRAVENOUS | Status: AC
  Administered 2020-01-26: 14:00:00 80 mg via INTRAVENOUS
  Filled 2020-01-26: qty 80

## 2020-01-26 MED ORDER — SODIUM CHLORIDE 0.9 % IV SOLN
8.0000 mg/h | INTRAVENOUS | Status: DC
  Administered 2020-01-26: 14:00:00 8 mg/h via INTRAVENOUS
  Filled 2020-01-26: qty 80

## 2020-01-26 MED ORDER — PANTOPRAZOLE SODIUM 40 MG IV SOLR: 40.0000 mg | Freq: Two times a day (BID) | INTRAVENOUS | Status: DC

## 2020-01-26 MED ORDER — CHLORHEXIDINE GLUCONATE CLOTH 2 % EX PADS
6.0000 | MEDICATED_PAD | Freq: Every day | CUTANEOUS | Status: DC
  Administered 2020-01-26: 05:00:00 6 via TOPICAL

## 2020-01-26 NOTE — Care Management (Signed)
Patient has been accepted by [facility] for transfer to higher level of care for advanced endoscopy.  [facility] transfer center was unable to provide me a pickup time.  They should notify me when transport and pickup time has been arranged.  Pickup evidence overnight, patient since discharge documentation orders may need to be completed by overnight cross cover.  Ralene Muskrat MD

## 2020-01-26 NOTE — Progress Notes (Signed)
Report given to Anders Grant at [facility]. Patient will be transported via [place] transport. Patient is in agreement with plan of care.

## 2020-01-26 NOTE — Discharge Summary (Addendum)
I was notified at approximately 8:15 PM on on 01/26/2020 by overnight nurse practitioner Rufina Falco NP at [facility] transfer center had called and stated that they were on route for pickup.  This was surprising to me as I had just recently spoken with [facility] transfer center.  At time of my conversation with them they were unable to get me approximate pickup time.  However it appears that they were able to take patient for transfer.  Patient has a complex medical and surgical history, history of multiple CVAs, coronary artery disease status post recent PCI on dual antiplatelet therapy who presents for evaluation of a near syncopal event and suspected GI bleed.  The source of the GI bleed is suspected to be in the bypassed area of stomach as patient is status post Roux-en-Y gastric bypass.  Extended discussions with her gastroenterologist, vascular surgeon, general surgeon as well as the advanced endoscopist at [facility] in [place].  Given the patient's anatomic complexity and low likelihood of success of the procedure all consultants recommended transfer to tertiary care center.  On my examination the patient on 01/26/2020 the patient is a bit fatigued appearing however overall stable.  Hemoglobin has trended from 10.5 on admission to 8.2 at time of discharge.  Patient hemodynamically stable.  No changes in vital signs noted.  The patient will likely needed advanced endoscopist potentially double-balloon enteroscopy possibly done in an OR setting given her complex surgical anatomy.  Appreciate assistance from overnight cross cover.  Per my review of the medical record report was given to [facility] and patient transported at approximately 11:23 PM.  Patient was made aware of the transfer plan is in agreement.  Per overnight cross cover NP accepting physician at [facility] is Dr. Geroge Baseman.  Ralene Muskrat MD  PATIENT NAME: [name]    MR#:  [id number]  DATE OF  BIRTH:  July 25, 1970  DATE OF ADMISSION:  01/25/2020   ADMITTING PHYSICIAN: Collier Bullock, MD  DATE OF DISCHARGE: 01/26/2020  PRIMARY CARE PHYSICIAN: [name], MD   ADMISSION DIAGNOSIS:   Acute gastritis [K29.00] Acute upper GI bleed [K92.2]  DISCHARGE DIAGNOSIS:   Principal Problem:   Acute upper GI bleed Active Problems:   Depression with anxiety   Essential hypertension   Hypothyroidism   CAD (coronary artery disease)   Gastroenteritis   Near syncope   Diabetes mellitus ([place])   Dehydration   SECONDARY DIAGNOSIS:   Past Medical History:  Diagnosis Date  . Acute colitis 01/27/2017  . Acute pyelonephritis   . Anemia    iron deficiency anemia  . Aortic arch aneurysm ([place])   . BRCA negative 2014  . CAD (coronary artery disease)    a. 08/2003 Cath: LAD 30-40-med Rx; b. 11/2014 PCI: LAD 27m(3.25x23 Xience Alpine DES); c. 06/2015 PCI: D1 (2.25x12 Resolute Integrity DES); d. 06/2017 PCI: Patent mLAD stent, D2 95 (PTCA); e. 09/2017 PCI: D2 99ost (CBA); d. 12/2017 Cath: LM nl, LAD 376m80d (small), D1 40ost, D2 95ost, LCX 40p, RCA 40ost/p->Med rx for D2 given restenosis.  . Colitis 06/03/2015  . Colon polyp   . CVA (cerebral vascular accident) ([place]   Left side weakness.   . Degenerative tear of glenoid labrum of right shoulder 03/15/2017  . Diabetes mellitus without complication ([place]  . Family history of breast cancer    BRCA neg 2014  . Femur fracture, left [place]  . Gastric ulcer 04/27/2011  . History of echocardiogram  a. 03/2017 Echo: EF 60-65%, no rwma; b. 02/2018 Echo: EF 60-65%, no rwma. Nl RV fxn. No cardiac source of emboli (admitted w/ stroke).  . Hypertension   . Malignant melanoma of skin of scalp ([place])   . MI, acute, non ST segment elevation ([place])   . Neuromuscular disorder ([place])   . S/P drug eluting coronary stent placement 06/04/2015  . Sepsis ([place]) 03/18/2017  . Stroke [facility])    a. 02/2018 MRI: 67m late acute/early subacute L medial  frontal lobe inarct; b. 02/2018 MRA No large vessel occlusion or aneurysm. Mod to sev L P2 stenosis. thready L vertebral artery, diffusely dzs'd; c. 02/2018 Carotid U/S: <50% bilat ICA dzs.    HOSPITAL COURSE:   HPI: Kendra MRuth Kovichis a 49y.o female with PMH recurrent strokes, Chronic severe left carotis stenosis,  HTN, HLD, DM, GERD, Hypothyroidism, CAD s/p DES placement, Colitis s/p Roux -en-Y gastric bypass, Anemia, Multiple Sclerosis, dCHF, Aortic Aneurysm, PSVT, Pulmonary Embolism, Depression and Anxiety, Bipolar Disorder,CKD stage III, PVD [facility] with chief complaints of near syncopal episode, abdominal pain associated with nausea vomiting and diarrhea.  ED COURSE: On arrival to the ED, She was afebrile with blood pressure 107/76 mm Hg and pulse rate 83 beats/min. There were no focal neurological deficits; She was alert and oriented x4, and he did not demonstrate any memory deficits.  Initial labs revealed labs Na+ 135, K+ 5.0, chloride 105, bicarb 21, glucose 122, BUN 49, creatinine 2.30, calcium 8.2, alkaline phosphatase 91, albumin 3.0, lipase 35, AST 20, ALT 18, total protein 5.9, white count 12.7, hemoglobin 10.5, hematocrit 34.2, MCV 85.1, RDW 13.2, platelet count 276.  CT abdomen and pelvis was obtained and showed status post gastric bypass.  Distention of the excluded stomach, reflecting some degree of functional obstruction.Associated 3.5 x 4.2 cm intramural high density lesion in the proximal duodenum, favoring intraluminal duodenal hemorrhage. A pre-existing mass is not identified on the prior MR, although an underlying ulcer or small lesion is certainly possible. Given the patient's postsurgical anatomy, it is unclear that direct visualization of this region is possible endoscopically.  A follow-up bleeding scan was obtained which showed Uptake within the residual excluded stomach which subsequently passes into the duodenum.  Given abnormality on CT  abdomen and bleeding scan, GI consultation was obtained for further evaluation and management.  Significant Hospital Events   12/23: Presented to ED with near syncope, abdominal pain, n/v and diarrhea. Found to have acute GI bleed 12/23: Hospitalist asked to admit to stepdown 12/23: GI consulted for Acute GI Bleed 12/24: GI recommends transfer to tertiary center, accepted by [facility]   Acute Upper GI Bleed without evidence hemodynamic instability. Concern for bleeding source in the residual excluded stomach connected to the duodenum Hx: Roux-en-Gastric bypass now with recurrent GI bleeding  -IVF resuscitation to maintain MAP>65 -H&H monitoring q6h -Blood Consent.  Transfuse PRN Hgb<7 -Pantoprazole 885mIV x1 then gtt 81m34mr -NPO for pending endoscopy -Hold NSAIDs, steroids, ASA -GI Consult for EGD/Colonoscopy. Per GI'S recommendation needs transfer to tertiary care center for further evaluation with balloon assisted enteroscopy. [facility] KerNorva PavlovNear Syncope- Likely multifactorial in the setting of acute upper GI bleed and hypoglycemic episodes. -Recent EKG shows NSR rate of 61 -Telemetry monitoring shows no arrthymias -Check Orthostatic Vital Signs   Chronic Diastolic CHF (last known EF 60 to 65%) no evidence of exacerbation -Hypertension Hx: CAD status post DES -  Continuous cardiac monitoring -Maintain MAP greater than 65 -Continue home Norvasc, Imdur, Carvedilol as BP allows -Cardiology following outpatient -Repeat 2D Echocardiogram    CKD Stage III -Monitor I&O's / urinary output -Follow BMP -Ensure adequate renal perfusion -Avoid nephrotoxic agents as able -Replace electrolytes as indicated   Diabetes mellitus-now with episodes of hypoglycemia -CBGs -Sliding scale insulin -Follow hyper/hypoglycemia protocol  Anxiety and Depression Hx: Mood Disorder -Continue Pristiq, Wellbutrin, alprazolam, and trazodone    Hypothyroidism -Continue Euthyrox   DISCHARGE CONDITIONS:   Stable  CONSULTS OBTAINED:   Treatment Team:  Virgel Manifold, MD Schnier, Dolores Lory, MD  DRUG ALLERGIES:   Allergies  Allergen Reactions  . Lipitor [Atorvastatin] Other (See Comments)    Leg pains  . Tramadol Other (See Comments)    Mouth feels like it's on fire   DISCHARGE MEDICATIONS:   Allergies as of 01/27/2020      Reactions   Lipitor [atorvastatin] Other (See Comments)   Leg pains   Tramadol Other (See Comments)   Mouth feels like it's on fire      Medication List    STOP taking these medications   aspirin EC 81 MG tablet   clopidogrel 75 MG tablet Commonly known as: PLAVIX   doxycycline 100 MG tablet Commonly known as: VIBRA-TABS   FIBER COMPLETE PO   HAIR SKIN & NAILS GUMMIES PO   meloxicam 15 MG tablet Commonly known as: MOBIC   multivitamin capsule     TAKE these medications   ALPRAZolam 1 MG tablet Commonly known as: XANAX Take 1 tablet by mouth twice daily as needed What changed: reasons to take this   amLODipine 2.5 MG tablet Commonly known as: NORVASC Take 1 tablet (2.5 mg total) by mouth daily. What changed: when to take this   buPROPion 200 MG 12 hr tablet Commonly known as: WELLBUTRIN SR Take 200 mg by mouth every morning.   carvedilol 12.5 MG tablet Commonly known as: COREG Take 1 tablet (12.5 mg total) by mouth 2 (two) times daily with a meal.   desvenlafaxine 50 MG 24 hr tablet Commonly known as: PRISTIQ Take 50 mg by mouth daily.   Euthyrox 25 MCG tablet Generic drug: levothyroxine TAKE 1 TABLET BY MOUTH ONCE DAILY BEFORE BREAKFAST What changed: See the new instructions.   gabapentin 300 MG capsule Commonly known as: NEURONTIN Take 2 capsules (600 mg total) by mouth 2 (two) times daily.   isosorbide mononitrate 120 MG 24 hr tablet Commonly known as: IMDUR Take 1 tablet (120 mg total) by mouth daily.   nitroGLYCERIN 0.4 MG SL  tablet Commonly known as: NITROSTAT Place 1 tablet (0.4 mg total) under the tongue every 5 (five) minutes as needed for chest pain.   pantoprazole 40 MG tablet Commonly known as: PROTONIX Take 1 tablet by mouth twice daily   promethazine 25 MG tablet Commonly known as: PHENERGAN Take 25 mg by mouth every 6 (six) hours as needed for nausea or vomiting.   rosuvastatin 40 MG tablet Commonly known as: CRESTOR Take 1 tablet (40 mg total) by mouth daily at 6 PM.   SYSTANE OP Place 1 drop into both eyes daily as needed (dry eyes).   traZODone 100 MG tablet Commonly known as: DESYREL TAKE 1 TO 2 TABLETS BY MOUTH AT BEDTIME What changed:   when to take this  reasons to take this   Ubrelvy 100 MG Tabs Generic drug: Ubrogepant Take 100 mg by mouth 2 (two) times daily as needed (migraine).  DISCHARGE INSTRUCTIONS:    DIET:   Clear liquid diet  ACTIVITY:   Activity as tolerated  OXYGEN:   Home Oxygen: No.  Oxygen Delivery: room air  DISCHARGE LOCATION:   [facility]   If you experience worsening of your admission symptoms, develop shortness of breath, life threatening emergency, suicidal or homicidal thoughts you must seek medical attention immediately by calling 911 or calling your MD immediately  if symptoms less severe.  You Must read complete instructions/literature along with all the possible adverse reactions/side effects for all the Medicines you take and that have been prescribed to you. Take any new Medicines after you have completely understood and accpet all the possible adverse reactions/side effects.   Please note  You were cared for by a hospitalist during your hospital stay. If you have any questions about your discharge medications or the care you received while you were in the hospital after you are discharged, you can call the unit and asked to speak with the hospitalist on call if the hospitalist that took care of you is not  available. Once you are discharged, your primary care physician will handle any further medical issues. Please note that NO REFILLS for any discharge medications will be authorized once you are discharged, as it is imperative that you return to your primary care physician (or establish a relationship with a primary care physician if you do not have one) for your aftercare needs so that they can reassess your need for medications and monitor your lab values.    On the day of Discharge:  VITAL SIGNS:   Blood pressure (!) 155/84, pulse 82, temperature 97.7 F (36.5 C), temperature source Oral, resp. rate 18, height '5\' 3"'  (1.6 m), weight 67.1 kg, SpO2 98 %.  PHYSICAL EXAMINATION:    GENERAL:  49 y.o.-year-old patient lying in the bed with no acute distress.  EYES: Pupils equal, round, reactive to light and accommodation. No scleral icterus. Extraocular muscles intact.  HEENT: Head atraumatic, normocephalic. Oropharynx and nasopharynx clear.  NECK:  Supple, no jugular venous distention. No thyroid enlargement, no tenderness.  LUNGS: Normal breath sounds bilaterally, no wheezing, rales,rhonchi or crepitation. No use of accessory muscles of respiration.  CARDIOVASCULAR: S1, S2 normal. No murmurs, rubs, or gallops.  ABDOMEN: Soft, non-tender, non-distended. Bowel sounds present. No organomegaly or mass.  EXTREMITIES: No pedal edema, cyanosis, or clubbing.  NEUROLOGIC: Cranial nerves II through XII are intact. Muscle strength 5/5 in all extremities. Sensation intact. Gait not checked.  PSYCHIATRIC: The patient is alert and oriented x 3.  SKIN: No obvious rash, lesion, or ulcer.   DATA REVIEW:   CBC Recent Labs  Lab 01/25/20 0916 01/25/20 1839 01/26/20 2147  WBC 12.7*  --   --   HGB 10.5*   < > 8.9*  HCT 34.2*   < > 28.1*  PLT 276  --   --    < > = values in this interval not displayed.    Chemistries  Recent Labs  Lab 01/25/20 0916 01/26/20 0207  NA 135 141  K 5.0 4.5  CL 105  115*  CO2 21* 22  GLUCOSE 122* 146*  BUN 49* 55*  CREATININE 2.30* 2.01*  CALCIUM 8.2* 8.2*  AST 20  --   ALT 18  --   ALKPHOS 91  --   BILITOT 0.6  --      Microbiology Results  Results for orders placed or performed during the hospital encounter of 01/25/20  Gastrointestinal  Panel by PCR , Stool     Status: None   Collection Time: 01/25/20  2:42 PM   Specimen: Stool  Result Value Ref Range Status   Campylobacter species NOT DETECTED NOT DETECTED Final   Plesimonas shigelloides NOT DETECTED NOT DETECTED Final   Salmonella species NOT DETECTED NOT DETECTED Final   Yersinia enterocolitica NOT DETECTED NOT DETECTED Final   Vibrio species NOT DETECTED NOT DETECTED Final   Vibrio cholerae NOT DETECTED NOT DETECTED Final   Enteroaggregative E coli (EAEC) NOT DETECTED NOT DETECTED Final   Enteropathogenic E coli (EPEC) NOT DETECTED NOT DETECTED Final   Enterotoxigenic E coli (ETEC) NOT DETECTED NOT DETECTED Final   Shiga like toxin producing E coli (STEC) NOT DETECTED NOT DETECTED Final   Shigella/Enteroinvasive E coli (EIEC) NOT DETECTED NOT DETECTED Final   Cryptosporidium NOT DETECTED NOT DETECTED Final   Cyclospora cayetanensis NOT DETECTED NOT DETECTED Final   Entamoeba histolytica NOT DETECTED NOT DETECTED Final   Giardia lamblia NOT DETECTED NOT DETECTED Final   Adenovirus F40/41 NOT DETECTED NOT DETECTED Final   Astrovirus NOT DETECTED NOT DETECTED Final   Norovirus GI/GII NOT DETECTED NOT DETECTED Final   Rotavirus A NOT DETECTED NOT DETECTED Final   Sapovirus (I, II, IV, and V) NOT DETECTED NOT DETECTED Final    Comment: Performed at [facility], [address] [place], [place] [address]  C Difficile Quick Screen (NO PCR Reflex)     Status: None   Collection Time: 01/25/20  2:42 PM   Specimen: STOOL  Result Value Ref Range Status   C Diff antigen NEGATIVE NEGATIVE Final   C Diff toxin NEGATIVE NEGATIVE Final   C Diff interpretation No C. difficile  detected.  Final    Comment: Performed at [facility], [address] [place], [place] [address]  Resp Panel by RT-PCR (Flu A&B, Covid) Nasopharyngeal Swab     Status: None   Collection Time: 01/25/20  6:39 PM   Specimen: Nasopharyngeal Swab; Nasopharyngeal(NP) swabs in vial transport medium  Result Value Ref Range Status   SARS Coronavirus 2 by RT PCR NEGATIVE NEGATIVE Final    Comment: (NOTE) SARS-CoV-2 target nucleic acids are NOT DETECTED.  The SARS-CoV-2 RNA is generally detectable in upper respiratory specimens during the acute phase of infection. The lowest concentration of SARS-CoV-2 viral copies this assay can detect is 138 copies/mL. A negative result does not preclude SARS-Cov-2 infection and should not be used as the sole basis for treatment or other patient management decisions. A negative result may occur with  improper specimen collection/handling, submission of specimen other than nasopharyngeal swab, presence of viral mutation(s) within the areas targeted by this assay, and inadequate number of viral copies(<138 copies/mL). A negative result must be combined with clinical observations, patient history, and epidemiological information. The expected result is Negative.  Fact Sheet for Patients:  EntrepreneurPulse.com.au  Fact Sheet for Healthcare Providers:  IncredibleEmployment.be  This test is no t yet approved or cleared by the [place] FDA and  has been authorized for detection and/or diagnosis of SARS-CoV-2 by FDA under an Emergency Use Authorization (EUA). This EUA will remain  in effect (meaning this test can be used) for the duration of the COVID-19 declaration under Section 564(b)(1) of the Act, 21 U.S.C.section 360bbb-3(b)(1), unless the authorization is terminated  or revoked sooner.       Influenza A by PCR NEGATIVE NEGATIVE Final   Influenza B by PCR NEGATIVE NEGATIVE Final  Comment: (NOTE) The  Xpert Xpress SARS-CoV-2/FLU/RSV plus assay is intended as an aid in the diagnosis of influenza from Nasopharyngeal swab specimens and should not be used as a sole basis for treatment. Nasal washings and aspirates are unacceptable for Xpert Xpress SARS-CoV-2/FLU/RSV testing.  Fact Sheet for Patients: EntrepreneurPulse.com.au  Fact Sheet for Healthcare Providers: IncredibleEmployment.be  This test is not yet approved or cleared by the [place] FDA and has been authorized for detection and/or diagnosis of SARS-CoV-2 by FDA under an Emergency Use Authorization (EUA). This EUA will remain in effect (meaning this test can be used) for the duration of the COVID-19 declaration under Section 564(b)(1) of the Act, 21 U.S.C. section 360bbb-3(b)(1), unless the authorization is terminated or revoked.  Performed at [facility], [address] [place], [place] [address]   MRSA PCR Screening     Status: None   Collection Time: 01/26/20  6:01 AM   Specimen: Nasal Mucosa; Nasopharyngeal  Result Value Ref Range Status   MRSA by PCR NEGATIVE NEGATIVE Final    Comment:        The GeneXpert MRSA Assay (FDA approved for NASAL specimens only), is one component of a comprehensive MRSA colonization surveillance program. It is not intended to diagnose MRSA infection nor to guide or monitor treatment for MRSA infections. Performed at [facility], [address] [place], [place] [address]    *Note: Due to a large number of results and/or encounters for the requested time period, some results have not been displayed. A complete set of results can be found in Results Review.    RADIOLOGY:  No results found.   Management plans discussed with the patient, family and they are in agreement.  CODE STATUS:     Code Status Orders  (From admission, onward)         Start     Ordered   01/25/20 1505  Full code  Continuous         01/25/20 1505        Code Status History    Date Active Date Inactive Code Status Order ID Comments User Context   12/12/2019 1820 12/14/2019 1855 Full Code [id number]  Buford Dresser, MD ED   11/05/2019 2001 11/07/2019 2314 Full Code [id number]  Ivor Costa, MD ED   10/13/2019 2314 10/20/2019 2301 Full Code [id number]  Elwyn Reach, MD ED   09/03/2019 0528 09/12/2019 1946 Full Code [id number]  Vianne Bulls, MD Inpatient   09/01/2019 1619 09/02/2019 0147 Full Code [id number]  Fritzi Mandes, MD ED   04/18/2019 1951 04/21/2019 1726 Full Code [id number]  Ivor Costa, MD ED   01/27/2019 2340 01/28/2019 1848 Full Code [id number]  Athena Masse, MD ED   01/19/2019 2041 01/23/2019 1440 Full Code [id number]  Mansy, Arvella Merles, MD ED   10/13/2018 0746 10/19/2018 0127 Full Code [id number]  Mendel Corning, MD Inpatient   10/12/2018 2324 10/13/2018 0746 DNR [id number]  Orene Desanctis, DO Inpatient   10/12/2018 2237 10/12/2018 2324 Full Code [id number]  Orene Desanctis, DO ED   06/26/2018 0142 06/28/2018 1530 Full Code [id number]  Mayer Camel, NP ED   06/14/2018 2333 06/16/2018 1713 Full Code [id number]  Mayer Camel, NP ED   03/02/2018 1650 03/04/2018 1709 Full Code [id number]  Lavella Hammock, MD Inpatient   02/17/2018 2322 02/19/2018 1710 Full Code [id number]  Vaughan Basta, MD Inpatient   12/19/2017 1624 12/21/2017 1544 Full Code [id number]  Gladstone Lighter, MD Inpatient  09/19/2017 1657 09/21/2017 1519 Full Code [id number]  Loletha Grayer, MD ED   06/29/2017 1241 06/30/2017 1523 Full Code [id number]  Wellington Hampshire, MD Inpatient   04/09/2017 1448 04/13/2017 1738 DNR [id number]  Nicholes Mango, MD Inpatient   03/18/2017 2348 03/22/2017 1844 Full Code [id number]  Henreitta Leber, MD ED   03/16/2017 1539 03/16/2017 1937 Full Code [id number]  Poggi, Marshall Cork, MD Inpatient   01/27/2017 1934 01/29/2017 1839 Full Code [id number]  Salary, Avel Peace, MD Inpatient   01/07/2016 1630 01/07/2016 2016 Full Code [id number]  Corky Mull, MD  Inpatient   12/14/2015 0239 12/14/2015 1733 Full Code [id number]  Harrie Foreman, MD ED   11/07/2015 1521 11/08/2015 1920 Full Code [id number]  Baxter Hire, MD Inpatient   10/28/2015 1604 11/02/2015 1504 Full Code [id number]  Henreitta Leber, MD Inpatient   06/03/2015 1510 06/05/2015 1501 Full Code [id number]  Fritzi Mandes, MD Inpatient   04/17/2015 2159 04/18/2015 2045 Full Code [id number]  Vaughan Basta, MD ED   02/15/2015 2004 02/20/2015 1903 Full Code [id number]  Gonzella Lex, MD Inpatient   11/12/2014 0833 11/13/2014 1449 Full Code [id number]  Isaias Cowman, MD Inpatient   11/07/2014 2153 11/12/2014 0833 Full Code [id number]  Henreitta Leber, MD Inpatient   Advance Care Planning Activity      TOTAL TIME TAKING CARE OF THIS PATIENT: 35 minutes.    Rufina Falco, BSN, MSN, DNP, CCRN,FNP-C, AGACNP-BC [facility] Nurse Practitioner  [facility]    CC: Primary care physician; [name], MD   Note: This dictation was prepared with Dragon dictation along with smaller phrase technology. Any transcriptional errors that result from this process are unintentional.

## 2020-01-26 NOTE — Progress Notes (Signed)
Patient transferred to ICU via bed. Alert and oriented without complaints. Oriented to room and surroundings

## 2020-01-26 NOTE — ED Notes (Signed)
Admitting NP notified via secure chat of patients CBG of 57. NP also notified of patient receiving 12.5gm D50 IV per hypoglycemia protocol.

## 2020-01-26 NOTE — Progress Notes (Signed)
Kendra Darby, MD [address]  [place]  [place], [place] [address]  Main: [phone]  Fax: [phone] Pager: [phone]   Subjective: Patient reports having at least 3 episodes of black stools overnight.  She is n.p.o.  She feels hungry, reports cramping in her stomach.  She denies any chest pain, nausea or vomiting   Objective: Vital signs in last 24 hours: Vitals:   01/26/20 0530 01/26/20 0545 01/26/20 0800 01/26/20 0900  BP:   (!) 167/80 (!) 164/82  Pulse: 68 68 63 65  Resp: 11 11 12 11   Temp:      TempSrc:      SpO2: 100% 99% 100% 99%  Weight:      Height:       Weight change:   Intake/Output Summary (Last 24 hours) at 01/26/2020 1203 Last data filed at 01/25/2020 1950 Gross per 24 hour  Intake 52.15 ml  Output --  Net 52.15 ml     Exam: Heart:: Regular rate and rhythm, S1S2 present or without murmur or extra heart sounds Lungs: normal and clear to auscultation Abdomen: soft, nontender, normal bowel sounds   Lab Results: CBC Latest Ref Rng & Units 01/26/2020 01/26/2020 01/25/2020  WBC 4.0 - 10.5 K/uL - - -  Hemoglobin 12.0 - 15.0 g/dL 8.2(L) 8.1(L) 7.9(L)  Hematocrit 36.0 - 46.0 % 26.4(L) 25.3(L) 25.3(L)  Platelets 150 - 400 K/uL - - -   CMP Latest Ref Rng & Units 01/26/2020 01/25/2020 12/22/2019  Glucose 70 - 99 mg/dL 146(H) 122(H) 126(H)  BUN 6 - 20 mg/dL 55(H) 49(H) 23  Creatinine 0.44 - 1.00 mg/dL 2.01(H) 2.30(H) 1.86(H)  Sodium 135 - 145 mmol/L 141 135 142  Potassium 3.5 - 5.1 mmol/L 4.5 5.0 4.7  Chloride 98 - 111 mmol/L 115(H) 105 106  CO2 22 - 32 mmol/L 22 21(L) 21  Calcium 8.9 - 10.3 mg/dL 8.2(L) 8.2(L) 8.6(L)  Total Protein 6.5 - 8.1 g/dL - 5.9(L) -  Total Bilirubin 0.3 - 1.2 mg/dL - 0.6 -  Alkaline Phos 38 - 126 U/L - 91 -  AST 15 - 41 U/L - 20 -  ALT 0 - 44 U/L - 18 -    Micro Results: Recent Results (from the past 240 hour(s))  Gastrointestinal Panel by PCR , Stool     Status: None   Collection Time: 01/25/20   2:42 PM   Specimen: Stool  Result Value Ref Range Status   Campylobacter species NOT DETECTED NOT DETECTED Final   Plesimonas shigelloides NOT DETECTED NOT DETECTED Final   Salmonella species NOT DETECTED NOT DETECTED Final   Yersinia enterocolitica NOT DETECTED NOT DETECTED Final   Vibrio species NOT DETECTED NOT DETECTED Final   Vibrio cholerae NOT DETECTED NOT DETECTED Final   Enteroaggregative E coli (EAEC) NOT DETECTED NOT DETECTED Final   Enteropathogenic E coli (EPEC) NOT DETECTED NOT DETECTED Final   Enterotoxigenic E coli (ETEC) NOT DETECTED NOT DETECTED Final   Shiga like toxin producing E coli (STEC) NOT DETECTED NOT DETECTED Final   Shigella/Enteroinvasive E coli (EIEC) NOT DETECTED NOT DETECTED Final   Cryptosporidium NOT DETECTED NOT DETECTED Final   Cyclospora cayetanensis NOT DETECTED NOT DETECTED Final   Entamoeba histolytica NOT DETECTED NOT DETECTED Final   Giardia lamblia NOT DETECTED NOT DETECTED Final   Adenovirus F40/41 NOT DETECTED NOT DETECTED Final   Astrovirus NOT DETECTED NOT DETECTED Final   Norovirus GI/GII NOT DETECTED NOT DETECTED Final   Rotavirus A NOT DETECTED  NOT DETECTED Final   Sapovirus (I, II, IV, and V) NOT DETECTED NOT DETECTED Final    Comment: Performed at [facility], [place], [place] [address]  C Difficile Quick Screen (NO PCR Reflex)     Status: None   Collection Time: 01/25/20  2:42 PM   Specimen: STOOL  Result Value Ref Range Status   C Diff antigen NEGATIVE NEGATIVE Final   C Diff toxin NEGATIVE NEGATIVE Final   C Diff interpretation No C. difficile detected.  Final    Comment: Performed at [facility], [address] [place], [place] [address]  Resp Panel by RT-PCR (Flu A&B, Covid) Nasopharyngeal Swab     Status: None   Collection Time: 01/25/20  6:39 PM   Specimen: Nasopharyngeal Swab; Nasopharyngeal(NP) swabs in vial transport medium  Result Value Ref Range Status   SARS Coronavirus 2 by RT  PCR NEGATIVE NEGATIVE Final    Comment: (NOTE) SARS-CoV-2 target nucleic acids are NOT DETECTED.  The SARS-CoV-2 RNA is generally detectable in upper respiratory specimens during the acute phase of infection. The lowest concentration of SARS-CoV-2 viral copies this assay can detect is 138 copies/mL. A negative result does not preclude SARS-Cov-2 infection and should not be used as the sole basis for treatment or other patient management decisions. A negative result may occur with  improper specimen collection/handling, submission of specimen other than nasopharyngeal swab, presence of viral mutation(s) within the areas targeted by this assay, and inadequate number of viral copies(<138 copies/mL). A negative result must be combined with clinical observations, patient history, and epidemiological information. The expected result is Negative.  Fact Sheet for Patients:  EntrepreneurPulse.com.au  Fact Sheet for Healthcare Providers:  IncredibleEmployment.be  This test is no t yet approved or cleared by the [place] FDA and  has been authorized for detection and/or diagnosis of SARS-CoV-2 by FDA under an Emergency Use Authorization (EUA). This EUA will remain  in effect (meaning this test can be used) for the duration of the COVID-19 declaration under Section 564(b)(1) of the Act, 21 U.S.C.section 360bbb-3(b)(1), unless the authorization is terminated  or revoked sooner.       Influenza A by PCR NEGATIVE NEGATIVE Final   Influenza B by PCR NEGATIVE NEGATIVE Final    Comment: (NOTE) The Xpert Xpress SARS-CoV-2/FLU/RSV plus assay is intended as an aid in the diagnosis of influenza from Nasopharyngeal swab specimens and should not be used as a sole basis for treatment. Nasal washings and aspirates are unacceptable for Xpert Xpress SARS-CoV-2/FLU/RSV testing.  Fact Sheet for Patients: EntrepreneurPulse.com.au  Fact Sheet for  Healthcare Providers: IncredibleEmployment.be  This test is not yet approved or cleared by the [place] FDA and has been authorized for detection and/or diagnosis of SARS-CoV-2 by FDA under an Emergency Use Authorization (EUA). This EUA will remain in effect (meaning this test can be used) for the duration of the COVID-19 declaration under Section 564(b)(1) of the Act, 21 U.S.C. section 360bbb-3(b)(1), unless the authorization is terminated or revoked.  Performed at [facility], [address] [place], [place] [address]   MRSA PCR Screening     Status: None   Collection Time: 01/26/20  6:01 AM   Specimen: Nasal Mucosa; Nasopharyngeal  Result Value Ref Range Status   MRSA by PCR NEGATIVE NEGATIVE Final    Comment:        The GeneXpert MRSA Assay (FDA approved for NASAL specimens only), is one component of a comprehensive MRSA colonization  surveillance program. It is not intended to diagnose MRSA infection nor to guide or monitor treatment for MRSA infections. Performed at [facility], [address] [place], [place] [address]    Studies/Results: CT Abdomen Pelvis Wo Contrast  Result Date: 01/25/2020 CLINICAL DATA:  Diarrhea, weakness, on Plavix/aspirin EXAM: CT ABDOMEN AND PELVIS WITHOUT CONTRAST TECHNIQUE: Multidetector CT imaging of the abdomen and pelvis was performed following the standard protocol without IV contrast. COMPARISON:  MRI abdomen dated 09/05/2019. CT abdomen/pelvis dated 03/19/2017. FINDINGS: Lower chest: Lung bases are clear. Hepatobiliary: Unenhanced liver is unremarkable. Status post cholecystectomy. No intrahepatic or extrahepatic ductal dilatation. Pancreas: Within normal limits. Spleen: Within normal limits. Adrenals/Urinary Tract: Adrenal glands are within normal limits. Kidneys are notable for renal vascular calcifications but are otherwise unremarkable. No hydronephrosis. Bladder is mildly  thick-walled/trabeculated. Stomach/Bowel: Status post gastric bypass. Distension of the excluded stomach, reflecting some degree of functional obstruction, with an intramural high density lesion in the proximal duodenum measuring 3.5 x 4.2 cm (series 2/image 32), favoring intraluminal duodenal hemorrhage. A pre-existing mass is not identified (even in retrospect) when correlating with prior MR, although an underlying ulcer or small lesion is certainly possible. Bowel is otherwise unremarkable, without evidence of small bowel obstruction. Appendix is not discretely visualized. Vascular/Lymphatic: No evidence of abdominal aortic aneurysm. Atherosclerotic calcifications of the abdominal aorta and branch vessels. No suspicious abdominopelvic lymphadenopathy. Reproductive: Uterus is within normal limits. Left ovary is within normal limits.  No right adnexal mass. Other: No abdominopelvic ascites. Musculoskeletal: Status post ORIF of the left hip. Very mild degenerative changes of the lumbar spine. IMPRESSION: Status post gastric bypass. Distension of the excluded stomach, reflecting some degree of functional obstruction. Associated 3.5 x 4.2 cm intramural high density lesion in the proximal duodenum, favoring intraluminal duodenal hemorrhage. A pre-existing mass is not identified on the prior MR, although an underlying ulcer or small lesion is certainly possible. Given the patient's postsurgical anatomy, it is unclear that direct visualization of this region is possible endoscopically. If this is not possible, consider follow-up MR enterography in 4 weeks. Electronically Signed   By: Julian Hy M.D.   On: 01/25/2020 14:38   NM GI Blood Loss  Result Date: 01/26/2020 CLINICAL DATA:  Possible duodenal hemorrhage EXAM: NUCLEAR MEDICINE GASTROINTESTINAL BLEEDING SCAN TECHNIQUE: Sequential abdominal images were obtained following intravenous administration of Tc-45m labeled red blood cells. RADIOPHARMACEUTICALS:   19.82 mCi Tc-29m pertechnetate in-vitro labeled red cells. COMPARISON:  CT from earlier in the same day. FINDINGS: Initial images demonstrate opacification of the cardiac blood pool is well as the liver and spleen. Increased uptake is noted in the expected region of the residual stomach following gastric bypass surgery. Although this may represent free technetium the possibility of acute hemorrhage in the excluded segment deserves consideration. Imaging into the second hour shows passage of tracer into the proximal duodenum in the area of concern on recent CT examination. IMPRESSION: Uptake within the residual excluded stomach which subsequently passes into the duodenum. Although this may represent free technetium, given the findings on recent CT, bleeding gastric ulcer deserves consideration. Electronically Signed   By: Inez Catalina M.D.   On: 01/26/2020 09:29   Medications:  I have reviewed the patient's current medications. Prior to Admission:  Medications Prior to Admission  Medication Sig Dispense Refill Last Dose  . ALPRAZolam (XANAX) 1 MG tablet Take 1 tablet by mouth twice daily as needed (Patient taking differently: Take 1 mg by mouth 2 (two) times daily as needed  for anxiety.) 60 tablet 3 01/24/2020 at 1830  . amLODipine (NORVASC) 2.5 MG tablet Take 1 tablet (2.5 mg total) by mouth daily. (Patient taking differently: Take 2.5 mg by mouth every evening.) 90 tablet 3 01/24/2020 at 1830  . aspirin EC 81 MG tablet Take 1 tablet (81 mg total) by mouth daily. Swallow whole.   01/25/2020 at 0630  . buPROPion (WELLBUTRIN SR) 200 MG 12 hr tablet Take 200 mg by mouth every morning.   01/25/2020 at 0630  . carvedilol (COREG) 12.5 MG tablet Take 1 tablet (12.5 mg total) by mouth 2 (two) times daily with a meal. 60 tablet 3 01/25/2020 at 0630  . clopidogrel (PLAVIX) 75 MG tablet Take 1 tablet (75 mg total) by mouth daily. 90 tablet 3 01/25/2020 at 0630  . desvenlafaxine (PRISTIQ) 50 MG 24 hr tablet Take 50  mg by mouth daily.   01/24/2020 at 1830  . EUTHYROX 25 MCG tablet TAKE 1 TABLET BY MOUTH ONCE DAILY BEFORE BREAKFAST (Patient taking differently: Take 25 mcg by mouth daily before breakfast.) 90 tablet 3 01/25/2020 at 0630  . gabapentin (NEURONTIN) 300 MG capsule Take 2 capsules (600 mg total) by mouth 2 (two) times daily. 360 capsule 4 01/25/2020 at 0630  . isosorbide mononitrate (IMDUR) 120 MG 24 hr tablet Take 1 tablet (120 mg total) by mouth daily. 30 tablet 3 01/25/2020 at 0630  . pantoprazole (PROTONIX) 40 MG tablet Take 1 tablet by mouth twice daily (Patient taking differently: Take 40 mg by mouth 2 (two) times daily.) 180 tablet 1 01/25/2020 at 0630  . rosuvastatin (CRESTOR) 40 MG tablet Take 1 tablet (40 mg total) by mouth daily at 6 PM. 90 tablet 3 01/24/2020 at 1830  . traZODone (DESYREL) 100 MG tablet TAKE 1 TO 2 TABLETS BY MOUTH AT BEDTIME (Patient taking differently: Take 100-200 mg by mouth at bedtime as needed for sleep.) 30 tablet 5 01/24/2020 at 2300  . Biotin w/ Vitamins C & E (HAIR SKIN & NAILS GUMMIES PO) Take 4 tablets by mouth daily.  (Patient not taking: Reported on 01/25/2020)   Not Taking at Unknown time  . FIBER COMPLETE PO Take 3 capsules by mouth daily.  (Patient not taking: Reported on 01/25/2020)   Not Taking at Unknown time  . meloxicam (MOBIC) 15 MG tablet Take 15 mg by mouth daily. (Patient not taking: Reported on 01/25/2020)   Not Taking at Unknown time  . Multiple Vitamin (MULTIVITAMIN) capsule Take 1 capsule by mouth daily. gummies  (Patient not taking: Reported on 01/25/2020)   Not Taking at Unknown time  . nitroGLYCERIN (NITROSTAT) 0.4 MG SL tablet Place 1 tablet (0.4 mg total) under the tongue every 5 (five) minutes as needed for chest pain. 25 tablet 2 prn at prn  . Polyethyl Glycol-Propyl Glycol (SYSTANE OP) Place 1 drop into both eyes daily as needed (dry eyes).   unknown at prn  . promethazine (PHENERGAN) 25 MG tablet Take 25 mg by mouth every 6 (six) hours as  needed for nausea or vomiting.    unknown at prn  . UBRELVY 100 MG TABS Take 100 mg by mouth 2 (two) times daily as needed (migraine).    unknown at prn   Scheduled: . Chlorhexidine Gluconate Cloth  6 each Topical Q0600  . gabapentin  600 mg Oral BID  . insulin aspart  0-15 Units Subcutaneous Q4H  . pantoprazole  40 mg Intravenous Q12H  . [START ON 01/29/2020] pantoprazole  40 mg Intravenous Q12H  Continuous: . dextrose 5% lactated ringers 100 mL/hr at 01/26/20 0816  . pantoprozole (PROTONIX) infusion    . pantoprazole (PROTONIX) 80 mg IVPB     UPJ:SRPRXYVOP alcohol Anti-infectives (From admission, onward)   None     Scheduled Meds: . Chlorhexidine Gluconate Cloth  6 each Topical Q0600  . gabapentin  600 mg Oral BID  . insulin aspart  0-15 Units Subcutaneous Q4H  . pantoprazole  40 mg Intravenous Q12H  . [START ON 01/29/2020] pantoprazole  40 mg Intravenous Q12H   Continuous Infusions: . dextrose 5% lactated ringers 100 mL/hr at 01/26/20 0816  . pantoprozole (PROTONIX) infusion    . pantoprazole (PROTONIX) 80 mg IVPB     PRN Meds:.polyvinyl alcohol   Assessment: Principal Problem:   Acute upper GI bleed Active Problems:   Depression with anxiety   Essential hypertension   Hypothyroidism   CAD (coronary artery disease)   Gastroenteritis   Near syncope   Diabetes mellitus ([place])   Dehydration  Patient is a 49 year old female with history of metabolic syndrome s/p Roux-en-Y gastric bypass, history of coronary disease s/p PCI with drug-eluting stent, most recently on 12/13/2019, history of stage III CKD, history of prior CVA, left carotid to subclavian artery bypass, aortic arch aneurysm, history of iron deficiency anemia, history of pulmonary embolism is admitted with 2 days history of melena resulting in acute on chronic anemia.  Her hemoglobin on admission was 10.5, decreased to 8.2 today  NM GI Blood loss:  IMPRESSION: Uptake within the residual excluded stomach  which subsequently passes into the duodenum. Although this may represent free technetium, given the findings on recent CT, bleeding gastric ulcer deserves consideration.  Plan: Melena: Concern for bleeding source in the residual excluded stomach connected to the duodenum Patient will need a round Vu approach to access the residual stomach and duodenum. We do not have the capability to perform this procedure at [facility] which might have to be a balloon assisted approach Start pantoprazole drip Okay with clears for now Monitor H&H every 6 hours and transfuse as needed to maintain hemoglobin above 8 Continue to hold Plavix, last dose on 12/22 as reported by patient Recommend transfer to tertiary care center [facility] or [place].  We will try to communicate with on-call gastroenterology at [facility] to figure out if they have the capability to perform this procedure   LOS: 1 day   Eulis Salazar 01/26/2020, 12:03 PM

## 2020-01-26 NOTE — Plan of Care (Signed)
  Problem: Education: Goal: Knowledge of General Education information will improve Description: Including pain rating scale, medication(s)/side effects and non-pharmacologic comfort measures Outcome: Progressing   Problem: Bowel/Gastric: Goal: Will show no signs and symptoms of gastrointestinal bleeding Outcome: Not Progressing Note: Patient will be transferred to [facility] for further evaluation, hemoglobin stable

## 2020-01-26 NOTE — Progress Notes (Signed)
Hypoglycemic Event  CBG:57  Treatment: 8 ounces of orange juice  Symptoms: lethargic  Follow-up CBG: Time:2001 CBG Result:104  Possible Reasons for Event: clear liquid diet, poor oral intake  Comments/MD notified:    Joanne Gavel

## 2020-01-26 NOTE — Progress Notes (Signed)
PROGRESS NOTE    [name]  KNL:[id number] DOB: September 15, 1970 DOA: 01/25/2020 PCP: [name], MD   Brief Narrative:  49 y.o. female with medical history significant for coronary artery disease, diabetes mellitus with complications of stage III chronic kidney disease, history of CVA with left-sided weakness, depression who presents to the emergency room for evaluation of dizziness, lightheadedness and feeling like she was going to pass out.  Patient states that she had gone for cardiac rehab and after her exercise on her way out she suddenly became dizzy and lightheaded and felt like she was going to pass out and so requested for wheelchair.  She states that her blood pressure was low but is unable to tell me what it was.  She states that she has had diarrhea for 21/2 weeks and was recently on antibiotics for a UTI.  On the day of admission she noticed that her stools have been dark and remain loose.   12/13: Patient seen and examined.  Physical in ICU however remains hemodynamically stable.  Hemoglobin 8.2 this morning.  Drop from 10.5 on admission.  Patient did receive IV fluids.  Long discussion with gastroenterology, vascular surgery, general surgery.  Patient has a complex surgical history and suspected bleeding source is in the redundant bypass stomach status post Roux-en-Y.  Gastroenterology recommending transfer to higher level of care.  [facility] is unable to accommodate.  I have attempted transfer to [facility] however bed availability is an issue.  Transfer to [facility] requested.  Pending callback  Assessment & Plan:   Principal Problem:   Acute upper GI bleed Active Problems:   Depression with anxiety   Essential hypertension   Hypothyroidism   CAD (coronary artery disease)   Gastroenteritis   Near syncope   Diabetes mellitus (HCC)   Dehydration  Acute upper GI bleed History of Roux-en-Y gastric bypass -Suspected sources bypassed stomach -Seen by gastroenterology Dr.  Marius Ditch -Nuclear medicine bleeding scan reviewed -Recommending transfer to higher level of care for advanced endoscopy -[facility] gastroenterologist unable to accommodate [facility] without bed availability -Do transfer center contacted, pending callback Plan: Protonix gtt. Hold aspirin and Plavix last dose 01/24/2020 Serial H&H Transfuse as needed hemoglobin less than 8 or higher threshold of actively bleeding  Near syncope Most likely related to dehydration from GI losses as well acute blood loss Patient noted to be dehydrated, her baseline serum creatinine is 1.86 and today on admission it is 2.30 Aggressive IV fluid resuscitation Telemetry monitoring   Gastroenteritis C dif ruled out S/s likely secondary to GI bleed    Diabetes mellitus with complications of stage III chronic kidney disease Clear liquid diet Blood sugar checks every 4 hours      History of coronary artery disease status post recent PCI Hold aspirin and Plavix, last dose on 01/24/2020 Hold nitrates and beta-blockers due to relative hypotension     Hypothyroidism Continue Synthroid once patient is able to resume oral intake    Depression and anxiety Hold antidepressants    History of hypertension Hold all antihypertensive medications due to relative hypotension    DVT prophylaxis: SCD Code Status: Full Family Communication: None today Disposition Plan: Status is: Inpatient  Remains inpatient appropriate because:Inpatient level of care appropriate due to severity of illness   Dispo: The patient is from: Home              Anticipated d/c is to: Unknown at this time  Anticipated d/c date is: 3 days              Patient currently is not medically stable to d/c.  Suspected active upper GI bleed   Consultants:   Gastroenterology  Procedures:   None  Antimicrobials:   None   Subjective: Seen and examined.  Reports severe fatigue.  No pain  complaints.  Objective: Vitals:   01/26/20 0530 01/26/20 0545 01/26/20 0800 01/26/20 0900  BP:   (!) 167/80 (!) 164/82  Pulse: 68 68 63 65  Resp: 11 11 12 11   Temp:      TempSrc:      SpO2: 100% 99% 100% 99%  Weight:      Height:        Intake/Output Summary (Last 24 hours) at 01/26/2020 1415 Last data filed at 01/25/2020 1950 Gross per 24 hour  Intake 52.15 ml  Output --  Net 52.15 ml   Filed Weights   01/25/20 0908  Weight: 67.1 kg    Examination:  General exam: Appears calm and comfortable  Respiratory system: Clear to auscultation. Respiratory effort normal. Cardiovascular system: S1 & S2 heard, RRR. No JVD, murmurs, rubs, gallops or clicks. No pedal edema. Gastrointestinal system: Abdomen is nondistended, soft and nontender. No organomegaly or masses felt. Normal bowel sounds heard. Central nervous system: Alert and oriented. No focal neurological deficits. Extremities: Symmetric 5 x 5 power. Skin: No rashes, lesions or ulcers Psychiatry: Judgement and insight appear normal. Mood & affect appropriate.     Data Reviewed: I have personally reviewed following labs and imaging studies  CBC: Recent Labs  Lab 01/25/20 0916 01/25/20 1839 01/26/20 0207 01/26/20 0930  WBC 12.7*  --   --   --   HGB 10.5* 7.9* 8.1* 8.2*  HCT 34.2* 25.3* 25.3* 26.4*  MCV 85.1  --   --   --   PLT 276  --   --   --    Basic Metabolic Panel: Recent Labs  Lab 01/25/20 0916 01/26/20 0207  NA 135 141  K 5.0 4.5  CL 105 115*  CO2 21* 22  GLUCOSE 122* 146*  BUN 49* 55*  CREATININE 2.30* 2.01*  CALCIUM 8.2* 8.2*   GFR: Estimated Creatinine Clearance: 30.5 mL/min (A) (by C-G formula based on SCr of 2.01 mg/dL (H)). Liver Function Tests: Recent Labs  Lab 01/25/20 0916  AST 20  ALT 18  ALKPHOS 91  BILITOT 0.6  PROT 5.9*  ALBUMIN 3.0*   Recent Labs  Lab 01/25/20 0916  LIPASE 35   No results for input(s): AMMONIA in the last 168 hours. Coagulation Profile: No  results for input(s): INR, PROTIME in the last 168 hours. Cardiac Enzymes: No results for input(s): CKTOTAL, CKMB, CKMBINDEX, TROPONINI in the last 168 hours. BNP (last 3 results) No results for input(s): PROBNP in the last 8760 hours. HbA1C: Recent Labs    01/25/20 0916  HGBA1C 5.0   CBG: Recent Labs  Lab 01/26/20 0435 01/26/20 0557 01/26/20 0734 01/26/20 0959 01/26/20 1146  GLUCAP 124* 84 47* 81 64*   Lipid Profile: No results for input(s): CHOL, HDL, LDLCALC, TRIG, CHOLHDL, LDLDIRECT in the last 72 hours. Thyroid Function Tests: No results for input(s): TSH, T4TOTAL, FREET4, T3FREE, THYROIDAB in the last 72 hours. Anemia Panel: Recent Labs    01/26/20 1102  FOLATE 39.0  FERRITIN 27  TIBC 169*  IRON 43   Sepsis Labs: No results for input(s): PROCALCITON, LATICACIDVEN in the last 168 hours.  Recent  Results (from the past 240 hour(s))  Gastrointestinal Panel by PCR , Stool     Status: None   Collection Time: 01/25/20  2:42 PM   Specimen: Stool  Result Value Ref Range Status   Campylobacter species NOT DETECTED NOT DETECTED Final   Plesimonas shigelloides NOT DETECTED NOT DETECTED Final   Salmonella species NOT DETECTED NOT DETECTED Final   Yersinia enterocolitica NOT DETECTED NOT DETECTED Final   Vibrio species NOT DETECTED NOT DETECTED Final   Vibrio cholerae NOT DETECTED NOT DETECTED Final   Enteroaggregative E coli (EAEC) NOT DETECTED NOT DETECTED Final   Enteropathogenic E coli (EPEC) NOT DETECTED NOT DETECTED Final   Enterotoxigenic E coli (ETEC) NOT DETECTED NOT DETECTED Final   Shiga like toxin producing E coli (STEC) NOT DETECTED NOT DETECTED Final   Shigella/Enteroinvasive E coli (EIEC) NOT DETECTED NOT DETECTED Final   Cryptosporidium NOT DETECTED NOT DETECTED Final   Cyclospora cayetanensis NOT DETECTED NOT DETECTED Final   Entamoeba histolytica NOT DETECTED NOT DETECTED Final   Giardia lamblia NOT DETECTED NOT DETECTED Final   Adenovirus F40/41 NOT  DETECTED NOT DETECTED Final   Astrovirus NOT DETECTED NOT DETECTED Final   Norovirus GI/GII NOT DETECTED NOT DETECTED Final   Rotavirus A NOT DETECTED NOT DETECTED Final   Sapovirus (I, II, IV, and V) NOT DETECTED NOT DETECTED Final    Comment: Performed at [facility], [address] [place], [place] [address]  C Difficile Quick Screen (NO PCR Reflex)     Status: None   Collection Time: 01/25/20  2:42 PM   Specimen: STOOL  Result Value Ref Range Status   C Diff antigen NEGATIVE NEGATIVE Final   C Diff toxin NEGATIVE NEGATIVE Final   C Diff interpretation No C. difficile detected.  Final    Comment: Performed at [facility], [address] [place], [place] [address]  Resp Panel by RT-PCR (Flu A&B, Covid) Nasopharyngeal Swab     Status: None   Collection Time: 01/25/20  6:39 PM   Specimen: Nasopharyngeal Swab; Nasopharyngeal(NP) swabs in vial transport medium  Result Value Ref Range Status   SARS Coronavirus 2 by RT PCR NEGATIVE NEGATIVE Final    Comment: (NOTE) SARS-CoV-2 target nucleic acids are NOT DETECTED.  The SARS-CoV-2 RNA is generally detectable in upper respiratory specimens during the acute phase of infection. The lowest concentration of SARS-CoV-2 viral copies this assay can detect is 138 copies/mL. A negative result does not preclude SARS-Cov-2 infection and should not be used as the sole basis for treatment or other patient management decisions. A negative result may occur with  improper specimen collection/handling, submission of specimen other than nasopharyngeal swab, presence of viral mutation(s) within the areas targeted by this assay, and inadequate number of viral copies(<138 copies/mL). A negative result must be combined with clinical observations, patient history, and epidemiological information. The expected result is Negative.  Fact Sheet for Patients:  EntrepreneurPulse.com.au  Fact Sheet for Healthcare Providers:   IncredibleEmployment.be  This test is no t yet approved or cleared by the [place] FDA and  has been authorized for detection and/or diagnosis of SARS-CoV-2 by FDA under an Emergency Use Authorization (EUA). This EUA will remain  in effect (meaning this test can be used) for the duration of the COVID-19 declaration under Section 564(b)(1) of the Act, 21 U.S.C.section 360bbb-3(b)(1), unless the authorization is terminated  or revoked sooner.       Influenza A by PCR NEGATIVE NEGATIVE Final  Influenza B by PCR NEGATIVE NEGATIVE Final    Comment: (NOTE) The Xpert Xpress SARS-CoV-2/FLU/RSV plus assay is intended as an aid in the diagnosis of influenza from Nasopharyngeal swab specimens and should not be used as a sole basis for treatment. Nasal washings and aspirates are unacceptable for Xpert Xpress SARS-CoV-2/FLU/RSV testing.  Fact Sheet for Patients: EntrepreneurPulse.com.au  Fact Sheet for Healthcare Providers: IncredibleEmployment.be  This test is not yet approved or cleared by the [place] FDA and has been authorized for detection and/or diagnosis of SARS-CoV-2 by FDA under an Emergency Use Authorization (EUA). This EUA will remain in effect (meaning this test can be used) for the duration of the COVID-19 declaration under Section 564(b)(1) of the Act, 21 U.S.C. section 360bbb-3(b)(1), unless the authorization is terminated or revoked.  Performed at [facility], [address] [place], [place] [address]   MRSA PCR Screening     Status: None   Collection Time: 01/26/20  6:01 AM   Specimen: Nasal Mucosa; Nasopharyngeal  Result Value Ref Range Status   MRSA by PCR NEGATIVE NEGATIVE Final    Comment:        The GeneXpert MRSA Assay (FDA approved for NASAL specimens only), is one component of a comprehensive MRSA colonization surveillance program. It is not intended to diagnose MRSA infection  nor to guide or monitor treatment for MRSA infections. Performed at [facility], [address] [place], [place] [address]          Radiology Studies: CT Abdomen Pelvis Wo Contrast  Result Date: 01/25/2020 CLINICAL DATA:  Diarrhea, weakness, on Plavix/aspirin EXAM: CT ABDOMEN AND PELVIS WITHOUT CONTRAST TECHNIQUE: Multidetector CT imaging of the abdomen and pelvis was performed following the standard protocol without IV contrast. COMPARISON:  MRI abdomen dated 09/05/2019. CT abdomen/pelvis dated 03/19/2017. FINDINGS: Lower chest: Lung bases are clear. Hepatobiliary: Unenhanced liver is unremarkable. Status post cholecystectomy. No intrahepatic or extrahepatic ductal dilatation. Pancreas: Within normal limits. Spleen: Within normal limits. Adrenals/Urinary Tract: Adrenal glands are within normal limits. Kidneys are notable for renal vascular calcifications but are otherwise unremarkable. No hydronephrosis. Bladder is mildly thick-walled/trabeculated. Stomach/Bowel: Status post gastric bypass. Distension of the excluded stomach, reflecting some degree of functional obstruction, with an intramural high density lesion in the proximal duodenum measuring 3.5 x 4.2 cm (series 2/image 32), favoring intraluminal duodenal hemorrhage. A pre-existing mass is not identified (even in retrospect) when correlating with prior MR, although an underlying ulcer or small lesion is certainly possible. Bowel is otherwise unremarkable, without evidence of small bowel obstruction. Appendix is not discretely visualized. Vascular/Lymphatic: No evidence of abdominal aortic aneurysm. Atherosclerotic calcifications of the abdominal aorta and branch vessels. No suspicious abdominopelvic lymphadenopathy. Reproductive: Uterus is within normal limits. Left ovary is within normal limits.  No right adnexal mass. Other: No abdominopelvic ascites. Musculoskeletal: Status post ORIF of the left hip. Very mild degenerative  changes of the lumbar spine. IMPRESSION: Status post gastric bypass. Distension of the excluded stomach, reflecting some degree of functional obstruction. Associated 3.5 x 4.2 cm intramural high density lesion in the proximal duodenum, favoring intraluminal duodenal hemorrhage. A pre-existing mass is not identified on the prior MR, although an underlying ulcer or small lesion is certainly possible. Given the patient's postsurgical anatomy, it is unclear that direct visualization of this region is possible endoscopically. If this is not possible, consider follow-up MR enterography in 4 weeks. Electronically Signed   By: Julian Hy M.D.   On: 01/25/2020 14:38   NM GI Blood  Loss  Result Date: 01/26/2020 CLINICAL DATA:  Possible duodenal hemorrhage EXAM: NUCLEAR MEDICINE GASTROINTESTINAL BLEEDING SCAN TECHNIQUE: Sequential abdominal images were obtained following intravenous administration of Tc-61m labeled red blood cells. RADIOPHARMACEUTICALS:  19.82 mCi Tc-80m pertechnetate in-vitro labeled red cells. COMPARISON:  CT from earlier in the same day. FINDINGS: Initial images demonstrate opacification of the cardiac blood pool is well as the liver and spleen. Increased uptake is noted in the expected region of the residual stomach following gastric bypass surgery. Although this may represent free technetium the possibility of acute hemorrhage in the excluded segment deserves consideration. Imaging into the second hour shows passage of tracer into the proximal duodenum in the area of concern on recent CT examination. IMPRESSION: Uptake within the residual excluded stomach which subsequently passes into the duodenum. Although this may represent free technetium, given the findings on recent CT, bleeding gastric ulcer deserves consideration. Electronically Signed   By: Inez Catalina M.D.   On: 01/26/2020 09:29        Scheduled Meds: . Chlorhexidine Gluconate Cloth  6 each Topical Q0600  . gabapentin  600  mg Oral BID  . insulin aspart  0-15 Units Subcutaneous Q4H  . pantoprazole  40 mg Intravenous Q12H  . [START ON 01/29/2020] pantoprazole  40 mg Intravenous Q12H   Continuous Infusions: . dextrose 5% lactated ringers 100 mL/hr at 01/26/20 0816  . pantoprozole (PROTONIX) infusion 8 mg/hr (01/26/20 1409)     LOS: 1 day    Time spent: 35 minutes    Sidney Ace, MD [facility] Pager [phone]  If 7PM-7AM, please contact night-coverage 01/26/2020, 2:15 PM

## 2020-01-26 NOTE — ED Notes (Addendum)
Patient given d50 per hypoglycemic protocol. Receiving RN informed.  Admitting MD messaged via secure chat.

## 2020-01-28 DIAGNOSIS — Z9884 Bariatric surgery status: Secondary | ICD-10-CM | POA: Diagnosis not present

## 2020-01-28 DIAGNOSIS — K922 Gastrointestinal hemorrhage, unspecified: Secondary | ICD-10-CM | POA: Diagnosis not present

## 2020-01-28 DIAGNOSIS — I208 Other forms of angina pectoris: Secondary | ICD-10-CM | POA: Diagnosis not present

## 2020-01-28 SURGERY — ENTEROSCOPY
Anesthesia: General

## 2020-01-29 ENCOUNTER — Other Ambulatory Visit: Payer: Self-pay

## 2020-01-29 ENCOUNTER — Other Ambulatory Visit: Payer: Self-pay | Admitting: Family Medicine

## 2020-01-29 ENCOUNTER — Telehealth: Payer: Self-pay | Admitting: *Deleted

## 2020-01-29 ENCOUNTER — Encounter: Payer: Self-pay | Admitting: *Deleted

## 2020-01-29 DIAGNOSIS — Z9049 Acquired absence of other specified parts of digestive tract: Secondary | ICD-10-CM | POA: Diagnosis not present

## 2020-01-29 DIAGNOSIS — Z9884 Bariatric surgery status: Secondary | ICD-10-CM | POA: Diagnosis not present

## 2020-01-29 DIAGNOSIS — K449 Diaphragmatic hernia without obstruction or gangrene: Secondary | ICD-10-CM | POA: Diagnosis not present

## 2020-01-29 DIAGNOSIS — K529 Noninfective gastroenteritis and colitis, unspecified: Secondary | ICD-10-CM | POA: Diagnosis not present

## 2020-01-29 DIAGNOSIS — K922 Gastrointestinal hemorrhage, unspecified: Secondary | ICD-10-CM | POA: Diagnosis not present

## 2020-01-29 DIAGNOSIS — R339 Retention of urine, unspecified: Secondary | ICD-10-CM | POA: Diagnosis not present

## 2020-01-29 DIAGNOSIS — I2510 Atherosclerotic heart disease of native coronary artery without angina pectoris: Secondary | ICD-10-CM | POA: Diagnosis not present

## 2020-01-29 DIAGNOSIS — N183 Chronic kidney disease, stage 3 unspecified: Secondary | ICD-10-CM | POA: Diagnosis not present

## 2020-01-29 DIAGNOSIS — Z452 Encounter for adjustment and management of vascular access device: Secondary | ICD-10-CM | POA: Diagnosis not present

## 2020-01-29 LAB — H PYLORI, IGM, IGG, IGA AB
H Pylori IgG: 0.12 Index Value (ref 0.00–0.79)
H. Pylogi, Iga Abs: <9 units (ref 0.0–8.9)
H. Pylogi, Igm Abs: <9 units (ref 0.0–8.9)

## 2020-01-29 MED ORDER — DIPHENOXYLATE-ATROPINE 2.5-0.025 MG PO TABS: 1.0000 | ORAL_TABLET | Freq: Four times a day (QID) | ORAL | 3 refills | Status: DC | PRN

## 2020-01-29 NOTE — Telephone Encounter (Signed)
[name] called to inform staff she was still in the hospital at [facility] for a GI bleed. They are running tests and she said she expects to be there for 10 days. Staff will follow up in two weeks to see how she is feeling.

## 2020-01-29 NOTE — Telephone Encounter (Signed)
Pt currently admitted at [facility]. Sent mychart message notifying her the Lomotil has been sent to her pharmacy.

## 2020-01-30 ENCOUNTER — Telehealth: Payer: Self-pay

## 2020-01-30 DIAGNOSIS — Z980 Intestinal bypass and anastomosis status: Secondary | ICD-10-CM | POA: Diagnosis not present

## 2020-01-30 DIAGNOSIS — K922 Gastrointestinal hemorrhage, unspecified: Secondary | ICD-10-CM | POA: Diagnosis not present

## 2020-01-30 DIAGNOSIS — R933 Abnormal findings on diagnostic imaging of other parts of digestive tract: Secondary | ICD-10-CM | POA: Diagnosis not present

## 2020-01-30 NOTE — Telephone Encounter (Signed)
°   Care Management   Outreach Note  01/30/2020 Name: [name] MRN: [id number] DOB: Dec 11, 1970  Primary Care Provider: [name], MD Reason for referral : Care Management   An unsuccessful telephone outreach was attempted today. [name] was previously referred to the case management team for assistance with care management and care coordination. The care management team was unable to establish and maintain contact at that time.  A recent chart review indicated several hospital admissions over the past few months. Another attempt was made to engage [name] to discuss care management needs. Her primary care provider will be informed of our attempts to establish contact. The team will gladly assist [name] if she requires assistance and agrees to service.    Follow Up Plan:  The care management team will gladly outreach with [name] if she requires assistance and agrees to services.  [facility] Care Management [facility] [phone]

## 2020-01-31 ENCOUNTER — Encounter: Payer: Self-pay | Admitting: *Deleted

## 2020-01-31 DIAGNOSIS — I2510 Atherosclerotic heart disease of native coronary artery without angina pectoris: Secondary | ICD-10-CM | POA: Diagnosis not present

## 2020-01-31 DIAGNOSIS — Z955 Presence of coronary angioplasty implant and graft: Secondary | ICD-10-CM

## 2020-01-31 DIAGNOSIS — K922 Gastrointestinal hemorrhage, unspecified: Secondary | ICD-10-CM | POA: Diagnosis not present

## 2020-01-31 DIAGNOSIS — I208 Other forms of angina pectoris: Secondary | ICD-10-CM | POA: Diagnosis not present

## 2020-01-31 IMAGING — MR MR HEAD W/O CM
9 of 10 series · 41 of 48 positions shown · non-contrast
Comparison: 02/17/2018 CT head.  04/10/2017 MRI head.

CLINICAL DATA: 47 y/o F; slurred speech intermittently and
confusion. Dragging of the left leg.

EXAM:
MRI HEAD WITHOUT CONTRAST
TECHNIQUE: Multiplanar, multiecho pulse sequences of the brain and surrounding
structures were obtained without intravenous contrast.

[Series 2: ax dwi_tracew · axial · 3.0mm · 0.83mm/px · z∈[+23,+177]mm · 7 of 55 slices shown]
[im 1/55]
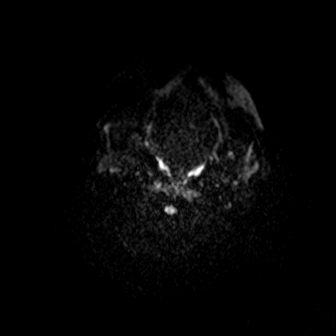
[im 10/55]
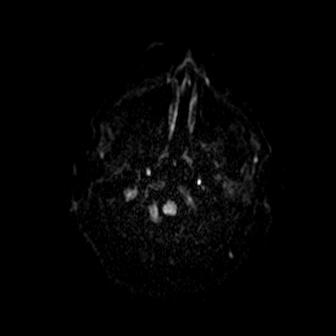
[im 19/55]
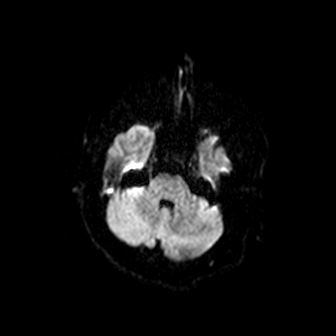
[im 28/55]
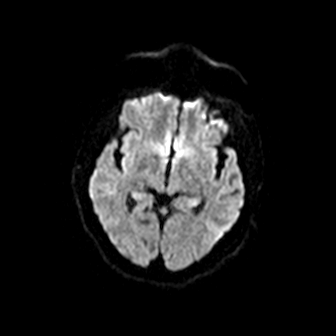
[im 37/55]
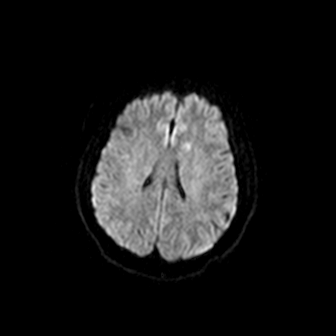
[im 46/55]
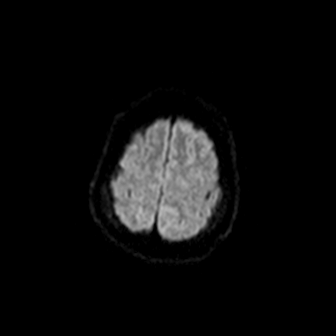
[im 55/55]
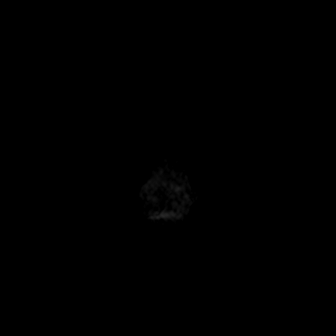

[Series 3: ax dwi_adc · axial · 3.0mm · 0.83mm/px · z∈[+23,+177]mm · 8 of 55 slices shown]
[im 1/55]
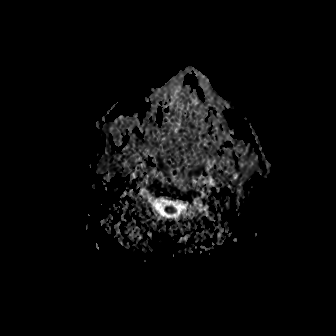
[im 8/55]
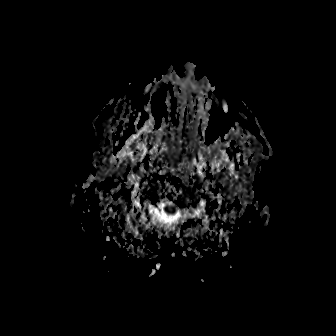
[im 16/55]
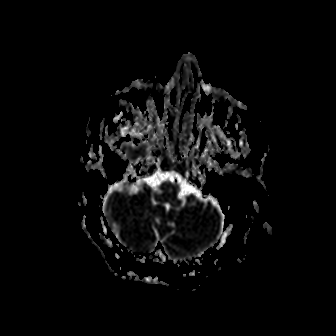
[im 24/55]
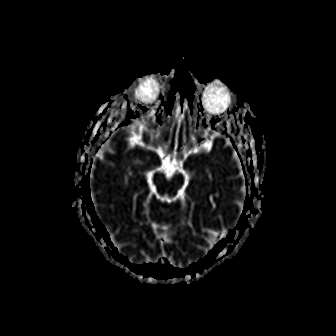
[im 31/55]
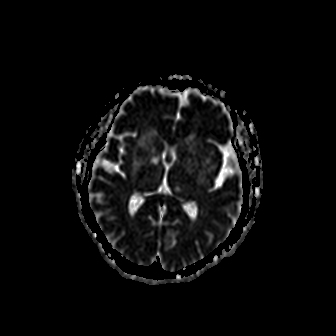
[im 39/55]
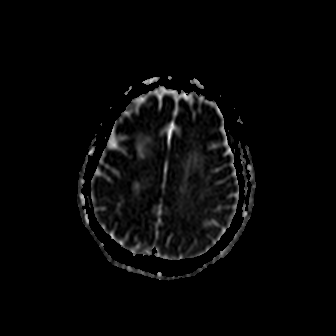
[im 47/55]
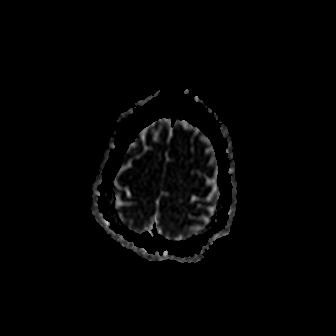
[im 55/55]
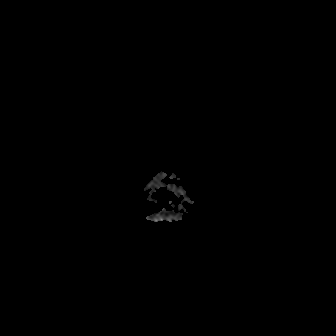

[Series 4: cor dwi_tracew · coronal · 5.0mm · 0.68mm/px · 5 of 39 slices shown]
[im 1/39]
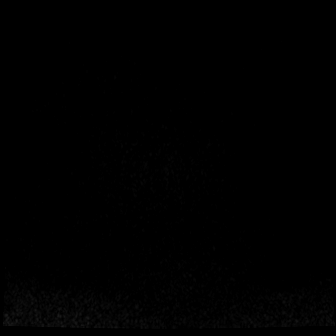
[im 10/39]
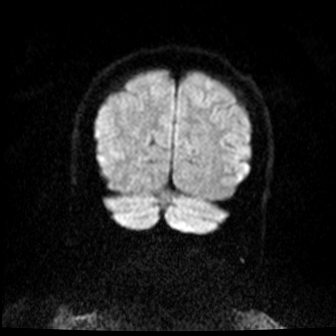
[im 20/39]
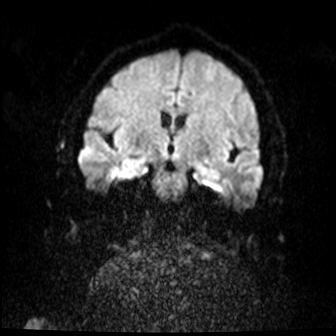
[im 29/39]
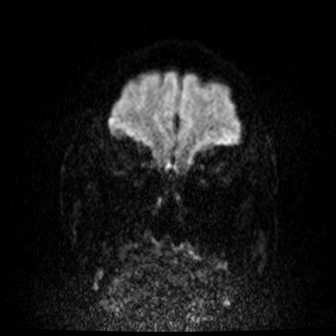
[im 39/39]
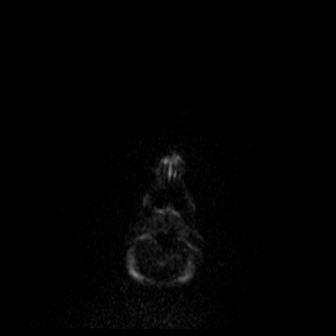

[Series 5: cor dwi_adc · coronal · 5.0mm · 0.68mm/px · 2 of 38 slices shown]
[im 1/38]
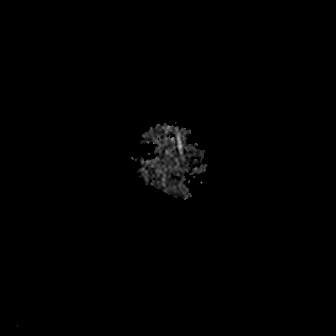
[im 10/38]
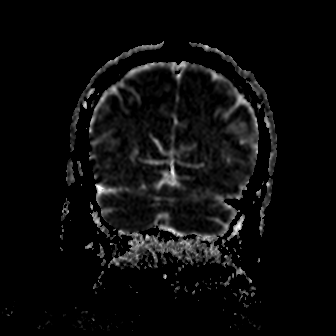

[Series 6: T1 · sagittal · 5.0mm · 0.94mm/px · 3 of 23 slices shown (1 of 2)]
[im 1/23]
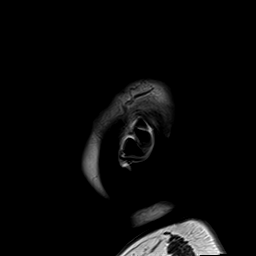
[im 12/23]
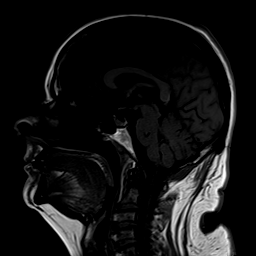
[im 23/23]
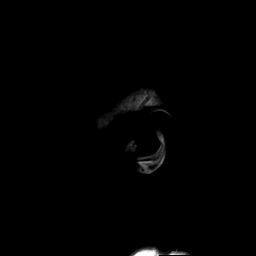

[Series 7: T2 · axial · 5.0mm · 0.45mm/px · z∈[+13,+161]mm · 4 of 27 slices shown (1 of 2)]
[im 1/27]
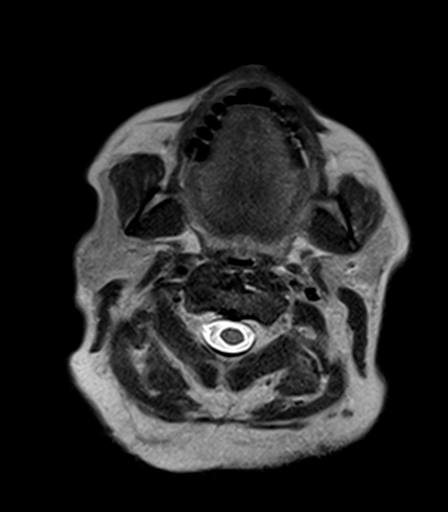
[im 9/27]
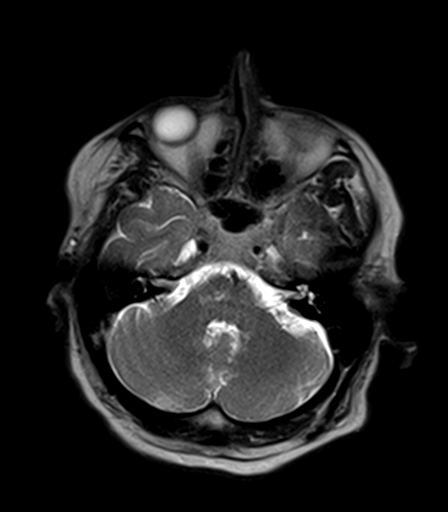
[im 18/27]
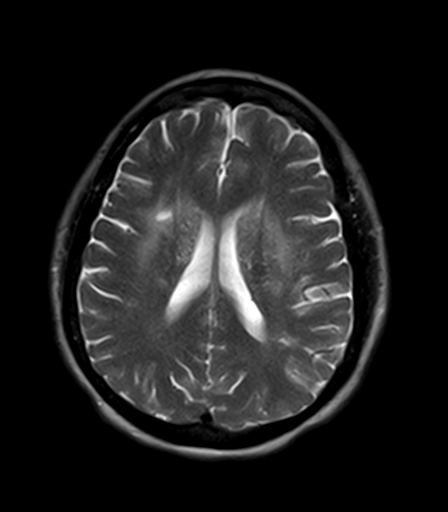
[im 27/27]
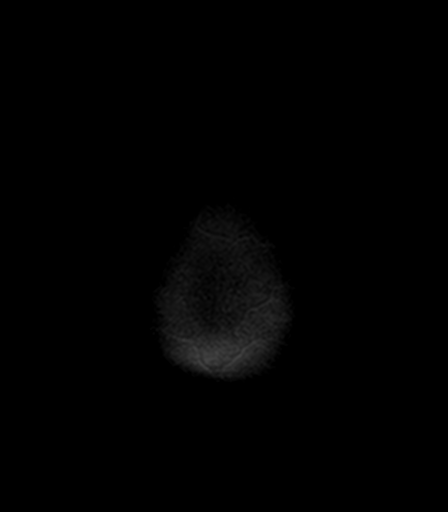

[Series 9: FLAIR · axial · 5.0mm · 1.20mm/px · z∈[+15,+163]mm · 4 of 27 slices shown]
[im 1/27]
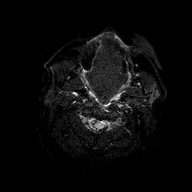
[im 9/27]
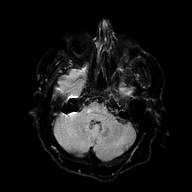
[im 18/27]
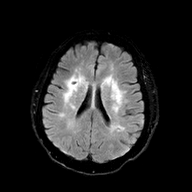
[im 27/27]
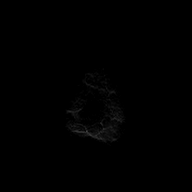

[Series 10: T1 · axial · 5.0mm · 0.90mm/px · z∈[+13,+161]mm · 4 of 27 slices shown (2 of 2)]
[im 1/27]
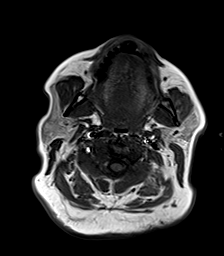
[im 9/27]
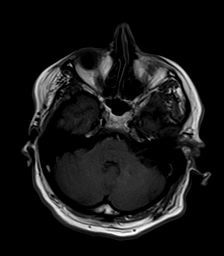
[im 18/27]
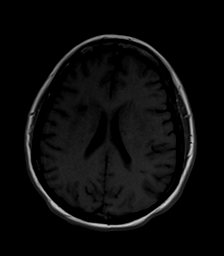
[im 27/27]
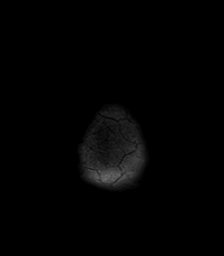

[Series 11: T2 · coronal · 5.0mm · 0.45mm/px · 4 of 31 slices shown (2 of 2)]
[im 1/31]
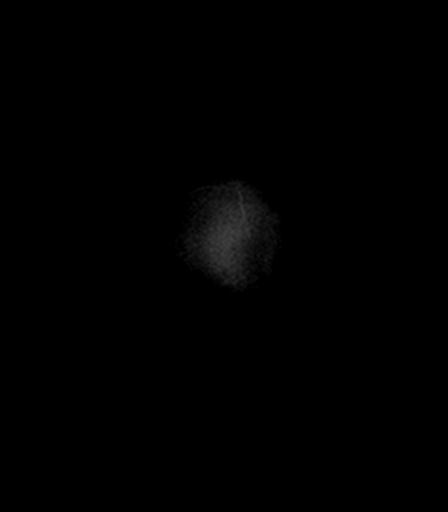
[im 11/31]
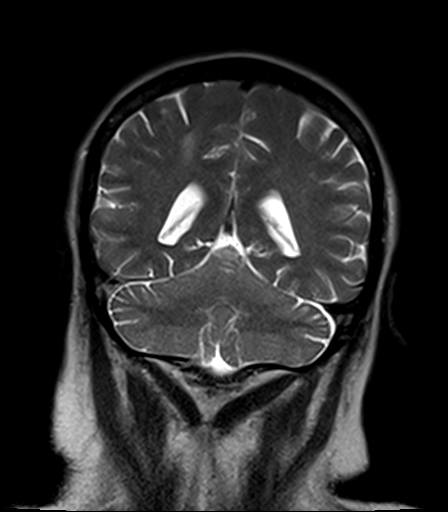
[im 21/31]
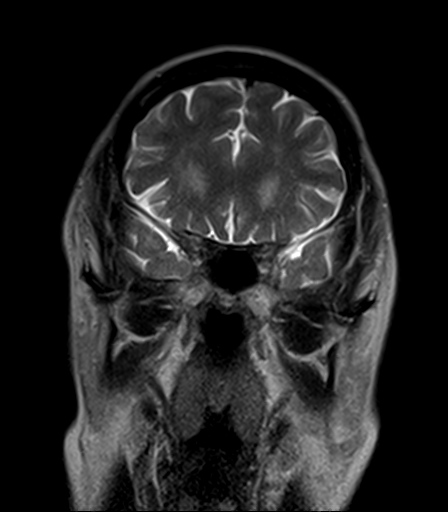
[im 31/31]
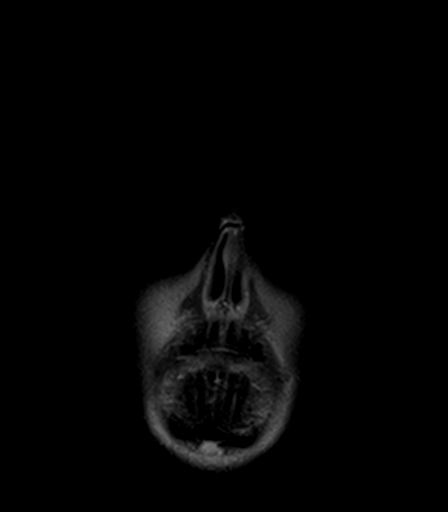

[41 of 48 positions shown; findings below may reference images not displayed]

FINDINGS: Brain: 7 mm focus of mildly reduced diffusion within the left medial
frontal white matter with T2 FLAIR hyperintense signal abnormality
(series 2, image 38) compatible with late acute to early subacute
infarction. No hemorrhage or mass effect.

Small chronic infarcts are present in the bilateral thalami, right
genu of corpus callosum, right hemi pons, right anterior and
posterior corona radiata and the left parietal cortex. Stable
confluent white matter hyperintensities in subcortical and
periventricular white matter, throughout basal ganglia, and pons
compatible with advanced chronic microvascular ischemic changes.
Punctate focus of susceptibility hypointensity within the right
parietal lobe compatible hemosiderin deposition of chronic
microhemorrhage. No extra-axial collection, hydrocephalus, mass
effect, or herniation.

Vascular: Normal flow voids.

Skull and upper cervical spine: Normal marrow signal.

Sinuses/Orbits: Negative.

Other: None.
IMPRESSION: 1. 7 mm late acute/early subacute infarction of left medial frontal
lobe. No associated hemorrhage or mass effect.
2. Stable advanced chronic microvascular ischemic changes and
multiple small chronic infarcts of the brain.

These results were called by telephone at the time of interpretation
on 02/17/2018 at [DATE] to Dr. MOHIRA ERA , who verbally
acknowledged these results.

## 2020-01-31 IMAGING — CR DG CHEST 2V
2 series · 2 of 2 positions shown · non-contrast
Comparison: 12/23/2017

CLINICAL DATA: Stroke

EXAM:
CHEST - 2 VIEW

[chest pa]
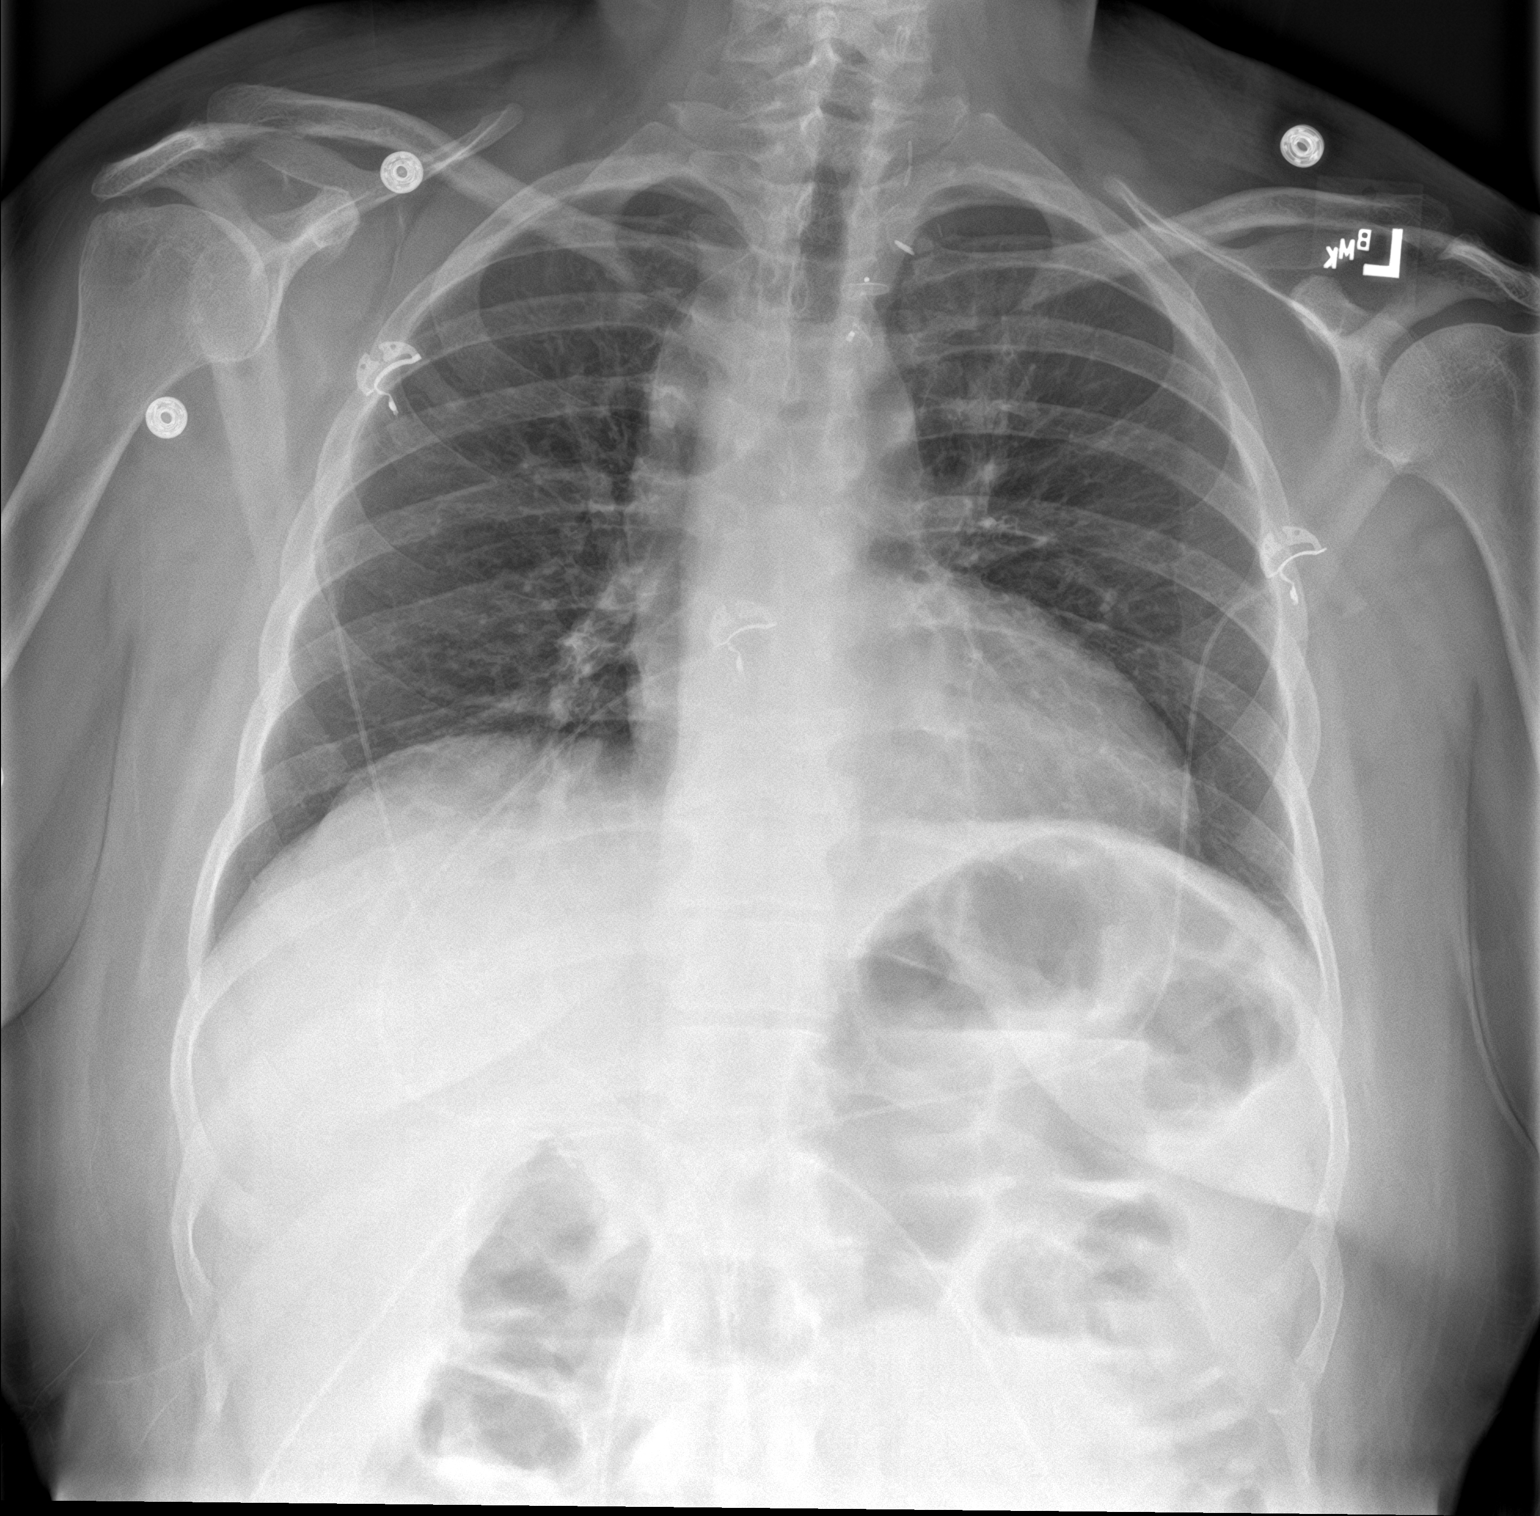

[chest lat]
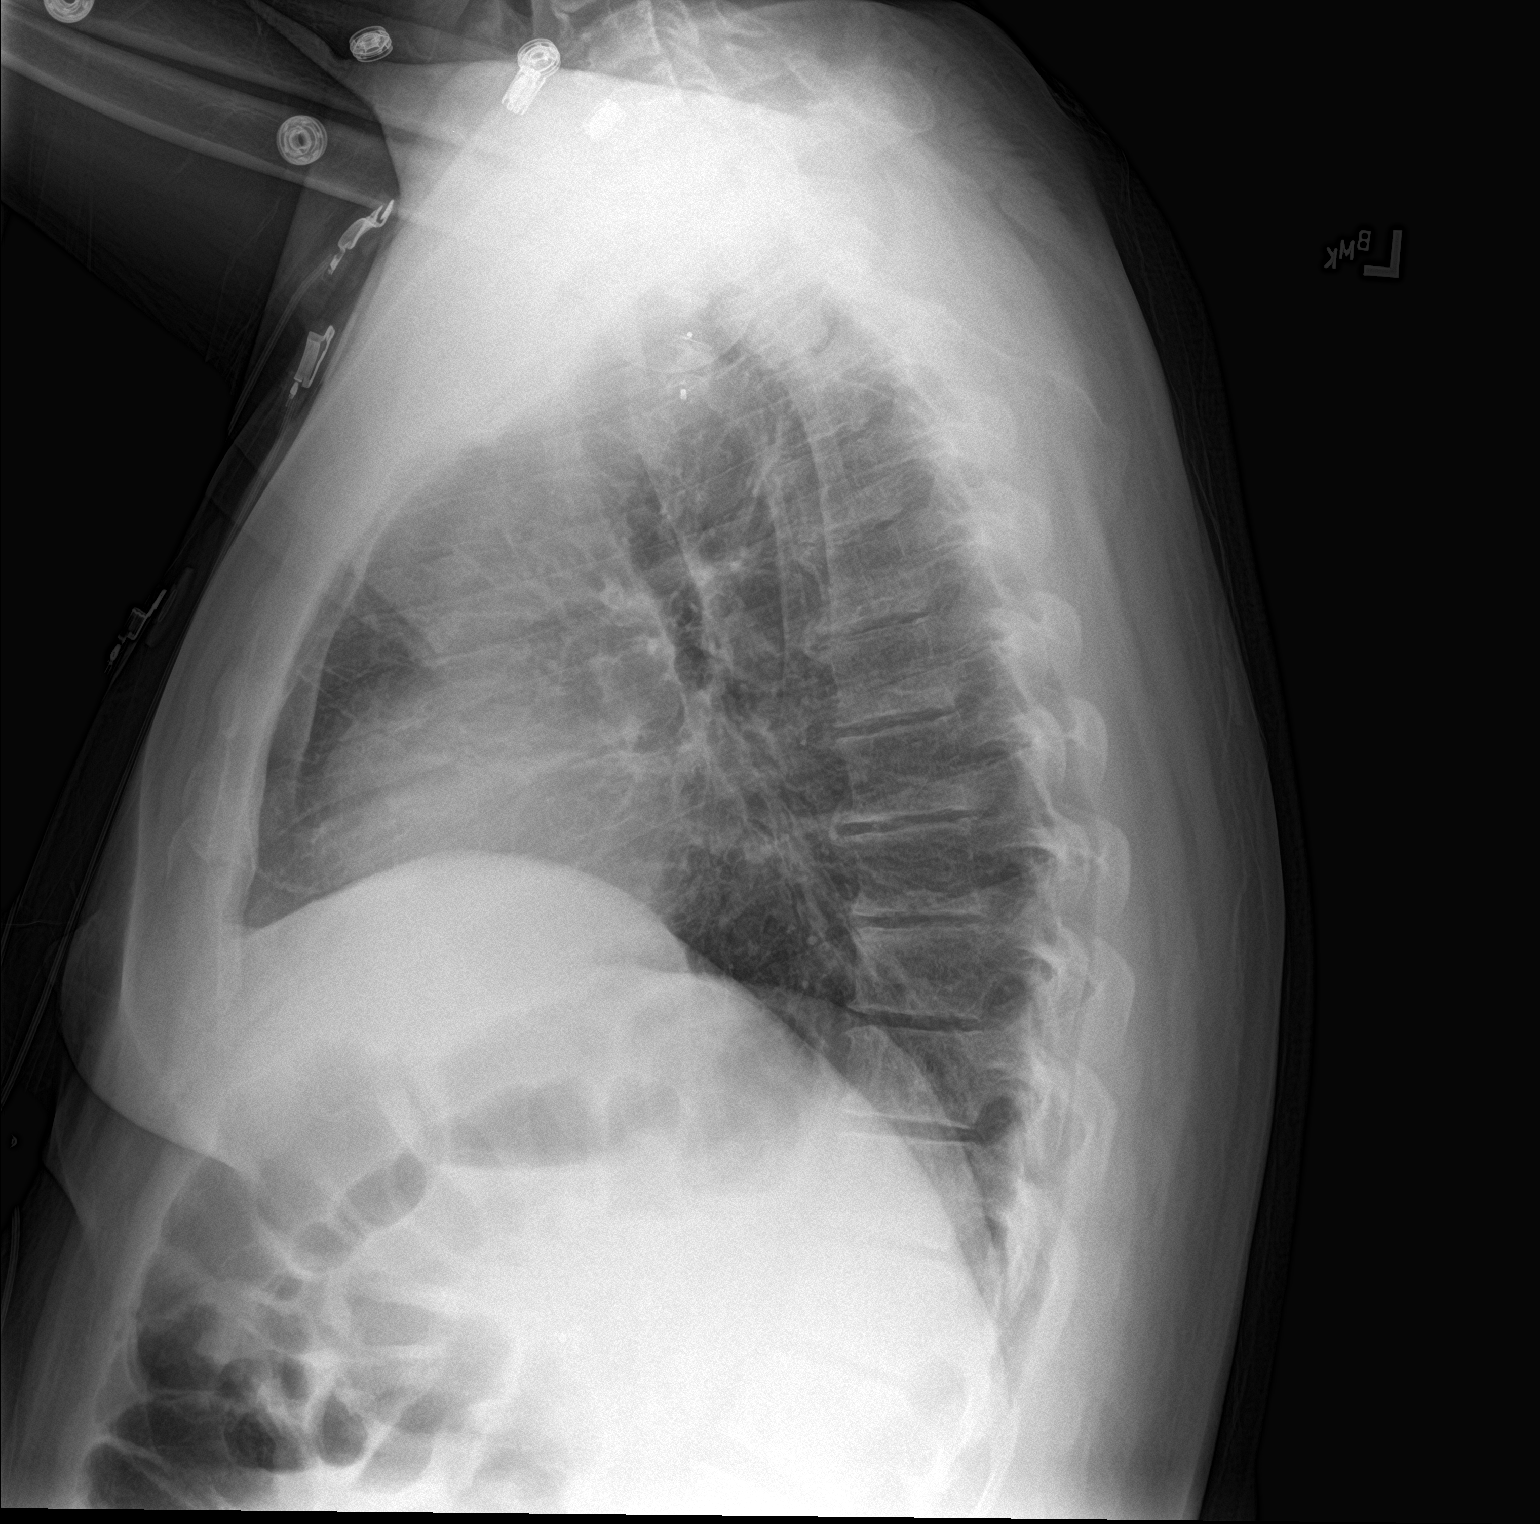

[2 of 2 positions shown; findings below may reference images not displayed]

FINDINGS: Lungs are clear.  No pleural effusion or pneumothorax.

The heart is normal in size. Surgical clips along the left superior
mediastinum.

Mild degenerative changes of the visualized thoracolumbar spine.
IMPRESSION: Normal chest radiographs.

## 2020-01-31 IMAGING — CT CT HEAD W/O CM
3 series · 15 of 46 positions shown, 18 images · non-contrast
Comparison: 12/23/2017

CLINICAL DATA: Dragging LEFT leg for a couple days, fatigue for 1
week, change in speech habits. History of coronary artery disease
post MI, diabetes, hypertension.

EXAM:
CT HEAD WITHOUT CONTRAST
TECHNIQUE: Contiguous axial images were obtained from the base of the skull
through the vertex without intravenous contrast. Sagittal and
coronal MPR images reconstructed from axial data set.

[Series 3: head wo · axial · 0.41mm/px · z∈[+502,+622]mm · 9 of 29 slices shown, 12 images]
[im 3/29  brain]
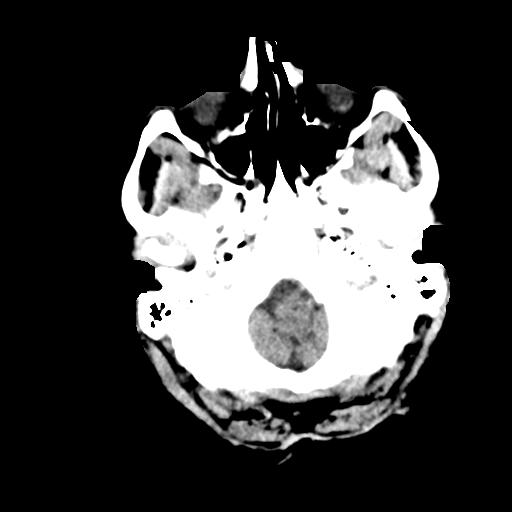
[im 3/29  bone]
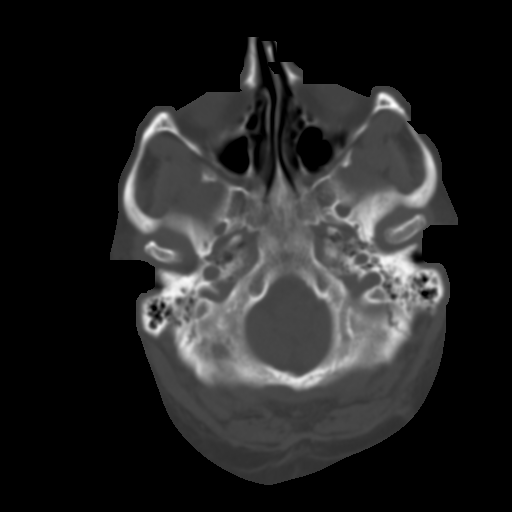
[im 6/29  brain]
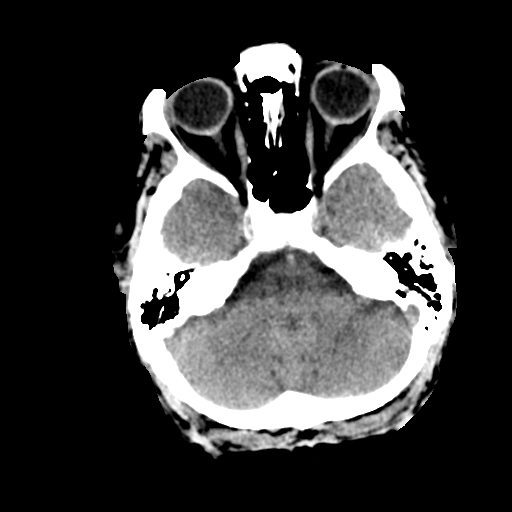
[im 9/29  brain]
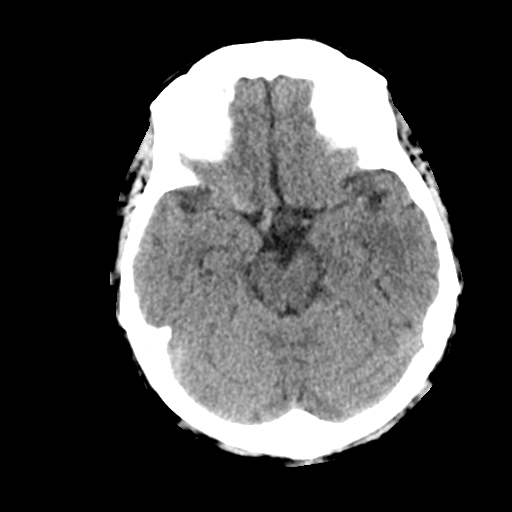
[im 12/29  brain]
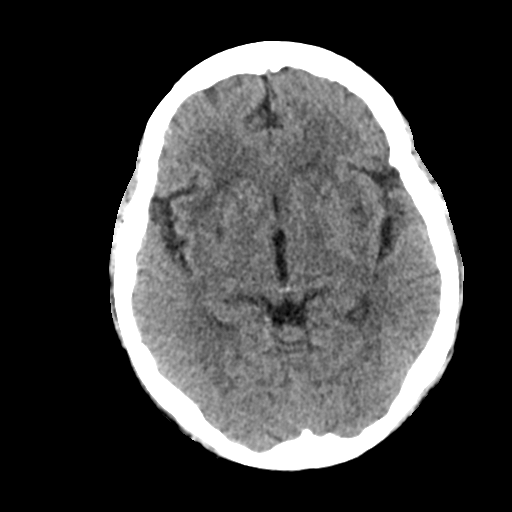
[im 15/29  brain]
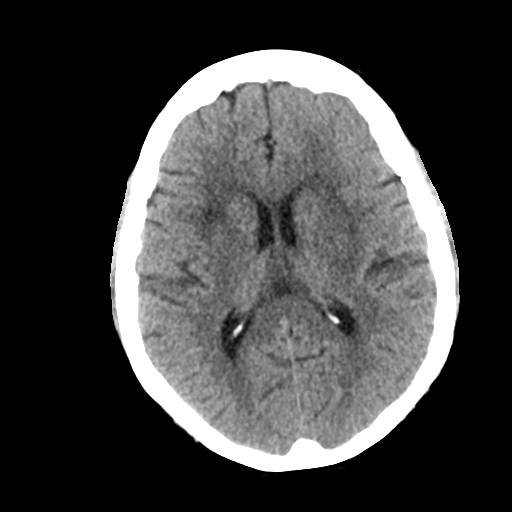
[im 15/29  bone]
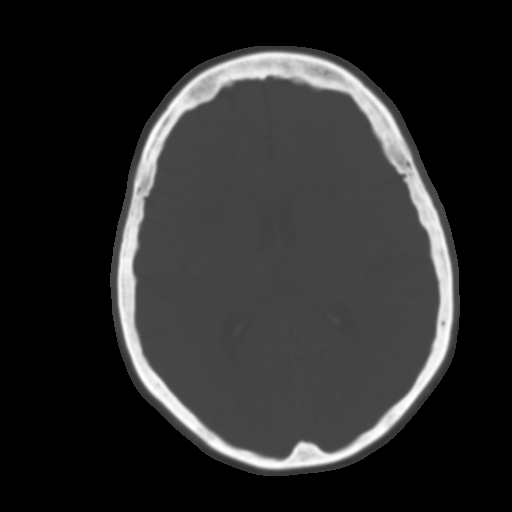
[im 18/29  brain]
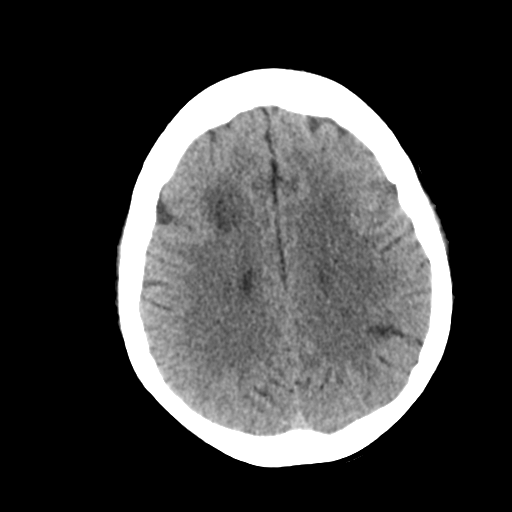
[im 21/29  brain]
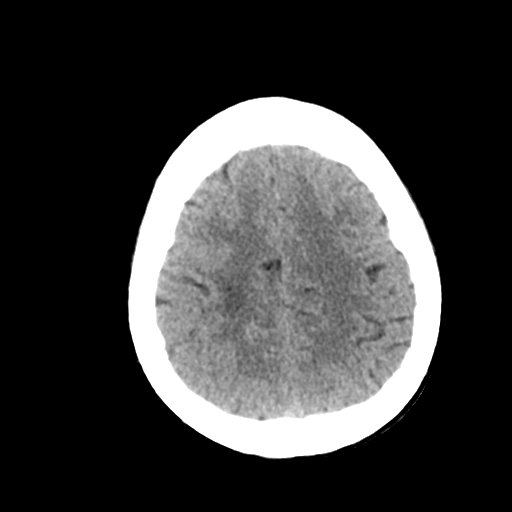
[im 24/29  brain]
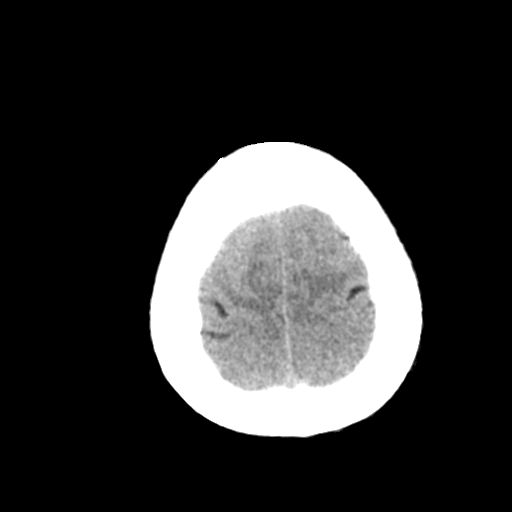
[im 27/29  brain]
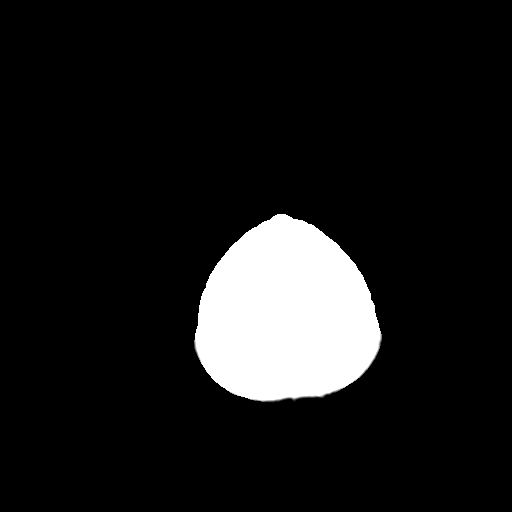
[im 27/29  bone]
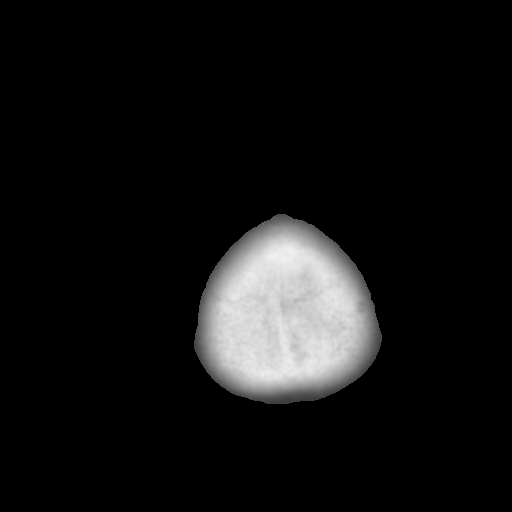

[Series 4: coronal soft tissue · coronal · 0.31mm/px · 3 of 67 slices shown]
[im 23/67  brain]
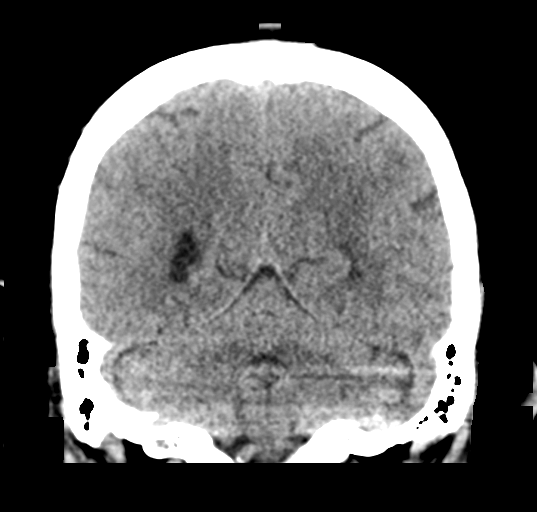
[im 30/67  brain]
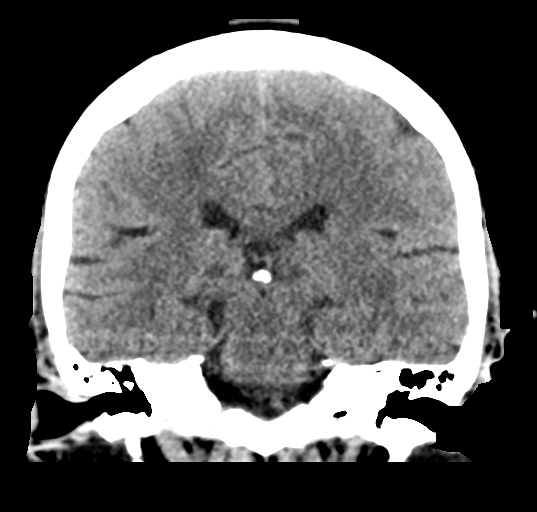
[im 37/67  brain]
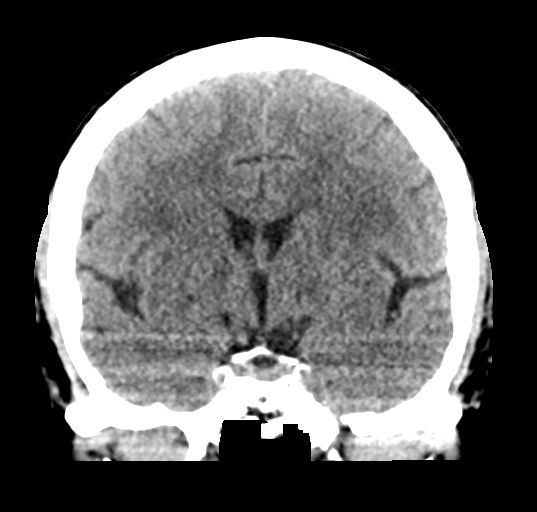

[Series 5: sagittal soft tissue · sagittal · 0.29mm/px · 3 of 57 slices shown]
[im 19/57  brain]
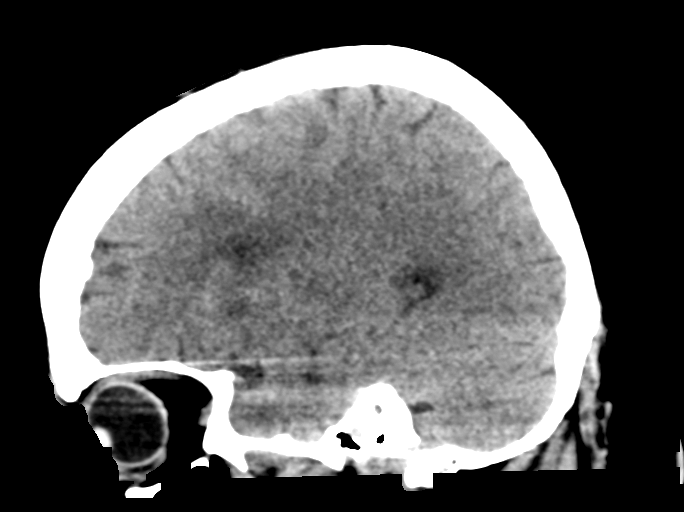
[im 29/57  brain]
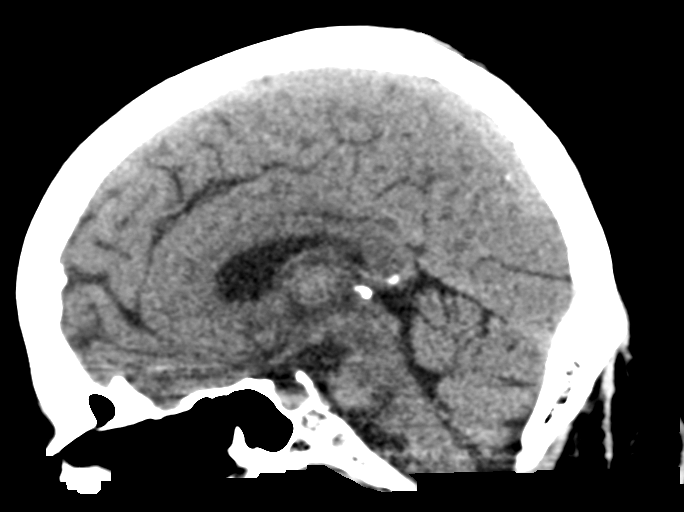
[im 38/57  brain]
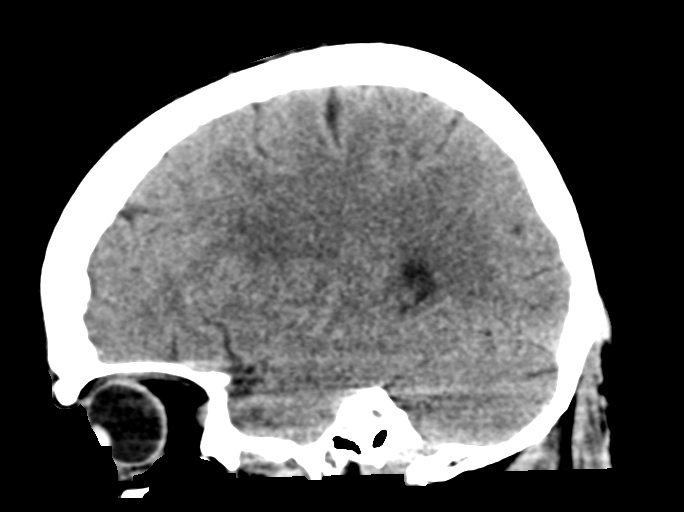

[15 of 46 positions shown; findings below may reference images not displayed]

FINDINGS: Brain: Generalized atrophy. Normal ventricular morphology. No
midline shift or mass effect. Small vessel chronic ischemic changes
of deep cerebral white matter. Old lacunar infarcts at genu of RIGHT
internal capsule and LEFT thalamus. No intracranial hemorrhage, mass
lesion, evidence of acute infarction, or extra-axial fluid
collection.

Vascular: Atherosclerotic calcifications of internal carotid and
vertebral arteries at skull base. No definite hyperdense vessels.

Skull: Intact

Sinuses/Orbits: Clear

Other: N/A
IMPRESSION: Atrophy with small vessel chronic ischemic changes of deep cerebral
white matter.

Old lacunar infarcts LEFT thalamus and genu of RIGHT internal
capsule.

No acute intracranial abnormalities.

## 2020-01-31 NOTE — Progress Notes (Signed)
Cardiac Individual Treatment Plan  Patient Details  Name: [name] MRN: [id number] Date of Birth: [birth year] Referring Provider:   Flowsheet Row Cardiac Rehab from 01/04/2020 in [facility] Cardiac and Pulmonary Rehab  Referring Provider Kathlyn Sacramento MD      Initial Encounter Date:  Flowsheet Row Cardiac Rehab from 01/04/2020 in [facility] Cardiac and Pulmonary Rehab  Date 01/04/20      Visit Diagnosis: Status post coronary artery stent placement  Patient's Home Medications on Admission:  Current Outpatient Medications:  .  ALPRAZolam (XANAX) 1 MG tablet, Take 1 tablet by mouth twice daily as needed (Patient taking differently: Take 1 mg by mouth 2 (two) times daily as needed for anxiety.), Disp: 60 tablet, Rfl: 3 .  amLODipine (NORVASC) 2.5 MG tablet, Take 1 tablet (2.5 mg total) by mouth daily. (Patient taking differently: Take 2.5 mg by mouth every evening.), Disp: 90 tablet, Rfl: 3 .  buPROPion (WELLBUTRIN SR) 200 MG 12 hr tablet, Take 200 mg by mouth every morning., Disp: , Rfl:  .  carvedilol (COREG) 12.5 MG tablet, Take 1 tablet (12.5 mg total) by mouth 2 (two) times daily with a meal., Disp: 60 tablet, Rfl: 3 .  desvenlafaxine (PRISTIQ) 50 MG 24 hr tablet, Take 50 mg by mouth daily., Disp: , Rfl:  .  diphenoxylate-atropine (LOMOTIL) 2.5-0.025 MG tablet, Take 1 tablet by mouth 4 (four) times daily as needed for diarrhea or loose stools., Disp: 120 tablet, Rfl: 3 .  EUTHYROX 25 MCG tablet, TAKE 1 TABLET BY MOUTH ONCE DAILY BEFORE BREAKFAST (Patient taking differently: Take 25 mcg by mouth daily before breakfast.), Disp: 90 tablet, Rfl: 3 .  gabapentin (NEURONTIN) 300 MG capsule, Take 2 capsules (600 mg total) by mouth 2 (two) times daily., Disp: 360 capsule, Rfl: 4 .  isosorbide mononitrate (IMDUR) 120 MG 24 hr tablet, Take 1 tablet (120 mg total) by mouth daily., Disp: 30 tablet, Rfl: 3 .  nitroGLYCERIN (NITROSTAT) 0.4 MG SL tablet, Place 1 tablet (0.4 mg total) under the tongue  every 5 (five) minutes as needed for chest pain., Disp: 25 tablet, Rfl: 2 .  pantoprazole (PROTONIX) 40 MG tablet, Take 1 tablet by mouth twice daily (Patient taking differently: Take 40 mg by mouth 2 (two) times daily.), Disp: 180 tablet, Rfl: 1 .  Polyethyl Glycol-Propyl Glycol (SYSTANE OP), Place 1 drop into both eyes daily as needed (dry eyes)., Disp: , Rfl:  .  promethazine (PHENERGAN) 25 MG tablet, Take 25 mg by mouth every 6 (six) hours as needed for nausea or vomiting. , Disp: , Rfl:  .  rosuvastatin (CRESTOR) 40 MG tablet, Take 1 tablet (40 mg total) by mouth daily at 6 PM., Disp: 90 tablet, Rfl: 3 .  traZODone (DESYREL) 100 MG tablet, TAKE 1 TO 2 TABLETS BY MOUTH AT BEDTIME (Patient taking differently: Take 100-200 mg by mouth at bedtime as needed for sleep.), Disp: 30 tablet, Rfl: 5 .  UBRELVY 100 MG TABS, Take 100 mg by mouth 2 (two) times daily as needed (migraine). , Disp: , Rfl:   Past Medical History: Past Medical History:  Diagnosis Date  . Acute colitis 01/27/2017  . Acute pyelonephritis   . Anemia    iron deficiency anemia  . Aortic arch aneurysm ([place])   . BRCA negative 2014  . CAD (coronary artery disease)    a. 08/2003 Cath: LAD 30-40-med Rx; b. 11/2014 PCI: LAD 60m(3.25x23 Xience Alpine DES); c. 06/2015 PCI: D1 ((2.64B58Resolute Integrity DES); d. 06/2017  PCI: Patent mLAD stent, D2 95 (PTCA); e. 09/2017 PCI: D2 99ost (CBA); d. 12/2017 Cath: LM nl, LAD 63m 80d (small), D1 40ost, D2 95ost, LCX 40p, RCA 40ost/p->Med rx for D2 given restenosis.  . Colitis 06/03/2015  . Colon polyp   . CV[place] (cerebral vascular [place]) ([place]    Left side weakness.   . Degenerative tear of glenoid labrum of right shoulder 03/15/2017  . Diabetes mellitus without complication ([place]   . Family history of breast cancer    BRCA neg 2014  . [place]ur [place], [place] 10/12[place]  . Gastric ulcer 04/27/2011  . History of echocardiogram    a. 03/2017 Echo: EF 60-65%, no rwma; b. 02/2018 Echo: EF 60-65%, no  rwma. Nl RV fxn. No cardiac source of emboli (admitted w/ stroke).  . Hypertension   . Malignant melanoma of skin of scalp ([place]   . MI, acute, non ST segment elevation ([place]   . Neuromuscular disorder ([place]   . S/P drug eluting coronary stent placement 06/04/2015  . [place] 03/18/2017  . Stroke ([facility]    a. 02/2018 MRI: 770mlate acute/early subacute L medial frontal lobe inarct; b. 02/2018 MRA No large vessel occlusion or aneurysm. Mod to sev L P2 stenosis. thready L vertebral artery, diffusely dzs'd; c. 02/2018 Carotid U/S: <50% bilat ICA dzs.    Tobacco Use: Social History   Tobacco Use  Smoking Status Former Smoker  . Packs/day: 0.25  . Years: 1.00  . Pack years: 0.25  . Types: Cigarettes  . Quit date: 08/31/1994  . Years since quitting: 25.4  Smokeless Tobacco Never Used  Tobacco Comment   quit 28 years ago    Labs: Recent Review Flowsheet Data    Labs for ITP Cardiac and Pulmonary Rehab Latest Ref Rng & Units 01/28/2019 04/19/2019 09/03/2019 11/06/2019 01/25/2020   Cholestrol 0 - 200 mg/dL 106 125 - 94 -   LDLCALC 0 - 99 mg/dL 40 76 - 48 -   LDLDIRECT mg/dL - - - - -   HDL >40 mg/dL 56 34(L) - 29(L) -   Trlycerides <150 mg/dL 52 75 - 84 -   Hemoglobin A1c 4.8 - 5.6 % 5.6 5.1 4.8 5.2 5.0   PHART 7.350 - 7.450 - - - - -   PCO2ART 32.0 - 48.0 mmHg - - - - -   HCO3 20.0 - 28.0 mmol/L - - - - -   ACIDBASEDEF 0.0 - 2.0 mmol/L - - - - -   O2SAT % - - - - -       Exercise Target Goals: Exercise Program Goal: Individual exercise prescription set using results from initial 6 min walk test and THRR while considering  patient's activity barriers and safety.   Exercise Prescription Goal: Initial exercise prescription builds to 30-45 minutes a day of aerobic activity, 2-3 days per week.  Home exercise guidelines will be given to patient during program as part of exercise prescription that the participant will acknowledge.   Education: Aerobic Exercise: - Group verbal and  visual presentation on the components of exercise prescription. Introduces F.I.T.T principle from ACSM for exercise prescriptions.  Reviews F.I.T.T. principles of aerobic exercise including progression. Written material given at graduation.   Education: Resistance Exercise: - Group verbal and visual presentation on the components of exercise prescription. Introduces F.I.T.T principle from ACSM for exercise prescriptions  Reviews F.I.T.T. principles of resistance exercise including progression. Written material given at graduation.    Education: Exercise & Equipment Safety: -  Individual verbal instruction and demonstration of equipment use and safety with use of the equipment. Flowsheet Row Cardiac Rehab from 01/25/2020 in [facility] Cardiac and Pulmonary Rehab  Date 12/26/19  Educator [facility]  Instruction Review Code 1- Verbalizes Understanding      Education: Exercise Physiology & General Exercise Guidelines: - Group verbal and written instruction with models to review the exercise physiology of the cardiovascular system and associated critical values. Provides general exercise guidelines with specific guidelines to those with heart or lung disease.    Education: Flexibility, Balance, Mind/Body Relaxation: - Group verbal and visual presentation with interactive activity on the components of exercise prescription. Introduces F.I.T.T principle from ACSM for exercise prescriptions. Reviews F.I.T.T. principles of flexibility and balance exercise training including progression. Also discusses the mind body connection.  Reviews various relaxation techniques to help reduce and manage stress (i.e. Deep breathing, progressive muscle relaxation, and visualization). Balance handout provided to take home. Written material given at graduation.   Activity Barriers & Risk Stratification:  Activity Barriers & Cardiac Risk Stratification - 01/04/20 1347      Activity Barriers & Cardiac Risk Stratification    Activity Barriers Shortness of Breath;Deconditioning;Muscular Weakness;Other (comment)    Comments Neuropathy, rod in left leg    Cardiac Risk Stratification High           6 Minute Walk:  6 Minute Walk    Row Name 01/04/20 1338         6 Minute Walk   Phase Initial     Distance 805 feet     Walk Time 6 minutes     # of Rest Breaks 0     MPH 1.52     METS 2.79     RPE 13     Perceived Dyspnea  1     VO2 Peak 9.77     Symptoms Yes (comment)     Comments Had anxiety     Resting HR 64 bpm     Resting BP 116/70     Resting Oxygen Saturation  99 %     Exercise Oxygen Saturation  during 6 min walk 96 %     Max Ex. HR 80 bpm     Max Ex. BP 126/68     2 Minute Post BP 110/68            Oxygen Initial Assessment:   Oxygen Re-Evaluation:   Oxygen Discharge (Final Oxygen Re-Evaluation):   Initial Exercise Prescription:  Initial Exercise Prescription - 01/04/20 1300      Date of Initial Exercise RX and Referring Provider   Date 01/04/20    Referring Provider Kathlyn Sacramento MD      Treadmill   MPH 1.3    Grade 0    Minutes 15    METs 2      NuStep   Level 1    SPM 80    Minutes 15    METs 2.7      REL-XR   Level 1    Speed 50    Minutes 15    METs 2.7      Biostep-RELP   Level 1    SPM 50    Minutes 15    METs 2.7      Prescription Details   Frequency (times per week) 2    Duration Progress to 30 minutes of continuous aerobic without signs/symptoms of physical distress      Intensity   THRR 40-80% of Max Heartrate 106-148  Ratings of Perceived Exertion 11-13    Perceived Dyspnea 0-4      Progression   Progression Continue to progress workloads to maintain intensity without signs/symptoms of physical distress.      Resistance Training   Training Prescription Yes    Weight 3 lb    Reps 10-15           Perform Capillary Blood Glucose checks as needed.  Exercise Prescription Changes:  Exercise Prescription Changes    Row Name  01/04/20 1300 01/22/20 1600           Response to Exercise   Blood Pressure (Admit) 116/70 118/78      Blood Pressure (Exercise) 126/68 120/60      Blood Pressure (Exit) 110/68 102/62      Heart Rate (Admit) 64 bpm 75 bpm      Heart Rate (Exercise) 80 bpm 83 bpm      Heart Rate (Exit) 65 bpm 64 bpm      Oxygen Saturation (Admit) 99 % --      Oxygen Saturation (Exercise) 95 % --      Oxygen Saturation (Exit) 99 % --      Rating of Perceived Exertion (Exercise) 13 10      Perceived Dyspnea (Exercise) 1 --      Symptoms anxiety nervous      Comments walk test results first full day of exercise      Duration -- Progress to 30 minutes of  aerobic without signs/symptoms of physical distress      Intensity -- THRR unchanged             Progression   Progression -- Continue to progress workloads to maintain intensity without signs/symptoms of physical distress.      Average METs -- 1.1             Resistance Training   Training Prescription -- Yes      Weight -- 3 lb      Reps -- 10-15             Interval Training   Interval Training -- No             NuStep   Level -- 1      Minutes -- 30      METs -- 1.1             Exercise Comments:  Exercise Comments    Row Name 01/11/20 0750           Exercise Comments First full day of exercise!  Patient was oriented to gym and equipment including functions, settings, policies, and procedures.  Patient's individual exercise prescription and treatment plan were reviewed.  All starting workloads were established based on the results of the 6 minute walk test done at initial orientation visit.  The plan for exercise progression was also introduced and progression will be customized based on patient's performance and goals.              Exercise Goals and Review:  Exercise Goals    Row Name 01/04/20 1345             Exercise Goals   Increase Physical Activity Yes       Intervention Provide advice, education, support  and counseling about physical activity/exercise needs.;Develop an individualized exercise prescription for aerobic and resistive training based on initial evaluation findings, risk stratification, comorbidities and participant's personal goals.       Expected Outcomes  Short Term: Attend rehab on a regular basis to increase amount of physical activity.;Long Term: Add in home exercise to make exercise part of routine and to increase amount of physical activity.;Long Term: Exercising regularly at least 3-5 days a week.       Increase Strength and Stamina Yes       Intervention Provide advice, education, support and counseling about physical activity/exercise needs.;Develop an individualized exercise prescription for aerobic and resistive training based on initial evaluation findings, risk stratification, comorbidities and participant's personal goals.       Expected Outcomes Short Term: Increase workloads from initial exercise prescription for resistance, speed, and METs.;Short Term: Perform resistance training exercises routinely during rehab and add in resistance training at home;Long Term: Improve cardiorespiratory fitness, muscular endurance and strength as measured by increased METs and functional capacity (6MWT)       Able to understand and use rate of perceived exertion (RPE) scale Yes       Intervention Provide education and explanation on how to use RPE scale       Expected Outcomes Short Term: Able to use RPE daily in rehab to express subjective intensity level;Long Term:  Able to use RPE to guide intensity level when exercising independently       Able to understand and use Dyspnea scale Yes       Intervention Provide education and explanation on how to use Dyspnea scale       Expected Outcomes Short Term: Able to use Dyspnea scale daily in rehab to express subjective sense of shortness of breath during exertion;Long Term: Able to use Dyspnea scale to guide intensity level when exercising  independently       Knowledge and understanding of Target Heart Rate Range (THRR) Yes       Intervention Provide education and explanation of THRR including how the numbers were predicted and where they are located for reference       Expected Outcomes Short Term: Able to state/look up THRR;Short Term: Able to use daily as guideline for intensity in rehab;Long Term: Able to use THRR to govern intensity when exercising independently       Able to check pulse independently Yes       Intervention Provide education and demonstration on how to check pulse in carotid and radial arteries.;Review the importance of being able to check your own pulse for safety during independent exercise       Expected Outcomes Short Term: Able to explain why pulse checking is important during independent exercise;Long Term: Able to check pulse independently and accurately       Understanding of Exercise Prescription Yes       Intervention Provide education, explanation, and written materials on patient's individual exercise prescription       Expected Outcomes Short Term: Able to explain program exercise prescription;Long Term: Able to explain home exercise prescription to exercise independently       Improve claudication pain toleration; Improve walking ability Yes              Exercise Goals Re-Evaluation :  Exercise Goals Re-Evaluation    Row Name 01/11/20 0751 01/22/20 1637           Exercise Goal Re-Evaluation   Exercise Goals Review Increase Physical Activity;Able to understand and use rate of perceived exertion (RPE) scale;Knowledge and understanding of Target Heart Rate Range (THRR);Understanding of Exercise Prescription;Increase Strength and Stamina;Able to understand and use Dyspnea scale;Able to check pulse independently Increase Physical Activity;Increase Strength  and Stamina;Understanding of Exercise Prescription      Comments Reviewed RPE and dyspnea scales, THR and program prescription with pt today.   Pt voiced understanding and was given a copy of goals to take home. [name] has completed her first full day of exercise.  We will continue to montior her progress.      Expected Outcomes Short: Use RPE daily to regulate intensity. Long: Follow program prescription in THR. Short: Attend rehab regularly Long: Continue to follow program prescription             Discharge Exercise Prescription (Final Exercise Prescription Changes):  Exercise Prescription Changes - 01/22/20 1600      Response to Exercise   Blood Pressure (Admit) 118/78    Blood Pressure (Exercise) 120/60    Blood Pressure (Exit) 102/62    Heart Rate (Admit) 75 bpm    Heart Rate (Exercise) 83 bpm    Heart Rate (Exit) 64 bpm    Rating of Perceived Exertion (Exercise) 10    Symptoms nervous    Comments first full day of exercise    Duration Progress to 30 minutes of  aerobic without signs/symptoms of physical distress    Intensity THRR unchanged      Progression   Progression Continue to progress workloads to maintain intensity without signs/symptoms of physical distress.    Average METs 1.1      Resistance Training   Training Prescription Yes    Weight 3 lb    Reps 10-15      Interval Training   Interval Training No      NuStep   Level 1    Minutes 30    METs 1.1           Nutrition:  Target Goals: Understanding of nutrition guidelines, daily intake of sodium <1560m, cholesterol <2063m calories 30% from fat and 7% or less from saturated fats, daily to have 5 or more servings of fruits and vegetables.  Education: All About Nutrition: -Group instruction provided by verbal, written material, interactive activities, discussions, models, and posters to present general guidelines for heart healthy nutrition including fat, fiber, MyPlate, the role of sodium in heart healthy nutrition, utilization of the nutrition label, and utilization of this knowledge for meal planning. Follow up email sent as well. Written  material given at graduation.   Biometrics:  Pre Biometrics - 01/04/20 1347      Pre Biometrics   Height '5\' 3"'  (1.6 m)    Weight 153 lb 1.6 oz (69.4 kg)    BMI (Calculated) 27.13    Single Leg Stand 8.2 seconds            Nutrition Therapy Plan and Nutrition Goals:  Nutrition Therapy & Goals - 01/08/20 1609      Personal Nutrition Goals   Comments Picture Your Place: 6776 A PYP score greater than 60 likely indicates a healthful dietary pattern, although there may be some specific eating behaviors that could be improved. [name] has 2-3 servings of vegetables per day (green beans written in), 0-1 servings of fruit per day; 2+ servings of dark green vegeabltes, 0-3 servings of red and orange vegetables, 0-10 servings of starchy vegetables per week. She has 1 serving of whole grain breads per day and 1-2 servings of other whole grains per week. She rarely has red meat. She will eat chicken and has fish 1x/week. She has 1-2 servings of beans and 0-1 servings or nuts/nut butter per week. She has either 0  or 4+ servings of low fat dairy and eats natural cheese. She uses soft margarine, does not fry food, uses spray olive oil with baking, and herbs/spices on vegetables. She rarely gets low sodium or no salt added canned items, she will use seasoning packets or sauce 1x/week and canned soups, bouillon, and bottled dressings 1x/week. She does not salt her foods. She will have snacks like chips, crackers, pretzels, popcorn 2-3x/week. She has regular soda 2+x/day and does not drink alcohol.           Nutrition Assessments:  MEDIFICTS Score Key:  ?70 Need to make dietary changes   40-70 Heart Healthy Diet  ? 40 Therapeutic Level Cholesterol Diet  Flowsheet Row Cardiac Rehab from 01/04/2020 in [facility] Cardiac and Pulmonary Rehab  Picture Your Plate Total Score on Admission 67     Picture Your Plate Scores:  <87 Unhealthy dietary pattern with much room for improvement.  41-50 Dietary pattern  unlikely to meet recommendations for good health and room for improvement.  51-60 More healthful dietary pattern, with some room for improvement.   >60 Healthy dietary pattern, although there may be some specific behaviors that could be improved.    Nutrition Goals Re-Evaluation:   Nutrition Goals Discharge (Final Nutrition Goals Re-Evaluation):   Psychosocial: Target Goals: Acknowledge presence or absence of significant depression and/or stress, maximize coping skills, provide positive support system. Participant is able to verbalize types and ability to use techniques and skills needed for reducing stress and depression.   Education: Stress, Anxiety, and Depression - Group verbal and visual presentation to define topics covered.  Reviews how body is impacted by stress, anxiety, and depression.  Also discusses healthy ways to reduce stress and to treat/manage anxiety and depression.  Written material given at graduation. Flowsheet Row Cardiac Rehab from 01/25/2020 in [facility] Cardiac and Pulmonary Rehab  Date 01/11/20  Educator SB  Instruction Review Code 1- [employer] Understanding      Education: Sleep Hygiene -Provides group verbal and written instruction about how sleep can affect your health.  Define sleep hygiene, discuss sleep cycles and impact of sleep habits. Review good sleep hygiene tips.    Initial Review & Psychosocial Screening:  Initial Psych Review & Screening - 12/26/19 0843      Initial Review   Current issues with Current Psychotropic Meds;Current Anxiety/Panic;History of Depression;Current Depression;Current Stress Concerns;Current Sleep Concerns    Source of Stress Concerns Chronic Illness;Unable to perform yard/household activities    Comments Her son is a Airline 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 She gets of out breath alot and has chest pains sometimes and needs to lay down. She has chest pain gets depressed and is scared to go out by herself.       Family Dynamics   Good Support System? Yes    Comments She can look to her son and her church family. [name] states that she has no one else to look to for support.      Barriers   Psychosocial barriers to participate in program The patient should benefit from training in stress management and relaxation.      Screening Interventions   Interventions To provide support and resources with identified psychosocial needs;Encouraged to exercise;Provide feedback about the scores to participant    Expected Outcomes Short Term goal: Utilizing psychosocial counselor, staff and physician to assist with identification of specific Stressors or current issues interfering with healing process. Setting desired goal for each stressor or current issue identified.;Long Term Goal: Stressors  or current issues are controlled or eliminated.;Short Term goal: Identification and review with participant of any Quality of Life or Depression concerns found by scoring the questionnaire.;Long Term goal: The participant improves quality of Life and PHQ9 Scores as seen by post scores and/or verbalization of changes           Quality of Life Scores:   Quality of Life - 01/04/20 1351      Quality of Life   Select Quality of Life      Quality of Life Scores   Health/Function Pre 5.6 %    Socioeconomic Pre 18.86 %    Psych/Spiritual Pre 5.14 %    Family Pre 15.6 %    GLOBAL Pre 9.71 %          Scores of 19 and below usually indicate a poorer quality of life in these areas.  A difference of  2-3 points is a clinically meaningful difference.  A difference of 2-3 points in the total score of the Quality of Life Index has been associated with significant improvement in overall quality of life, self-image, physical symptoms, and general health in studies assessing change in quality of life.  PHQ-9: Recent Review Flowsheet Data    Depression screen [facility] 2/9 01/04/2020 08/22/2019 08/22/2019 12/09/2018   Decreased Interest '1 3 3 3    ' Down, Depressed, Hopeless '3 3 3 3   ' PHQ - 2 Score '4 6 6 6   ' Altered sleeping '3 3 3 3   ' Tired, decreased energy '3 3 3 3   ' Change in appetite '3 3 3 3   ' Feeling bad or failure about yourself  '3 3 2 3   ' Trouble concentrating '3 3 3 3   ' Moving slowly or fidgety/restless 3 2 0 3   Suicidal thoughts 2  0 0 1   PHQ-9 Score '24 23 20 25   ' Difficult doing work/chores Very difficult Very difficult Very difficult Extremely dIfficult     Interpretation of Total Score  Total Score Depression Severity:  1-4 = Minimal depression, 5-9 = Mild depression, 10-14 = Moderate depression, 15-19 = Moderately severe depression, 20-27 = Severe depression   Psychosocial Evaluation and Intervention:  Psychosocial Evaluation - 12/26/19 0846      Psychosocial Evaluation & Interventions   Interventions Encouraged to exercise with the program and follow exercise prescription;Relaxation education;Stress management education    Comments Her son is a Airline 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 She gets of out breath alot and has chest pains sometimes and needs to lay down.    Expected Outcomes Short: Exercise regularly to support mental health and notify staff of any changes. Long: maintain mental health and well being through teaching of rehab or prescribed medications independently.    Continue Psychosocial Services  Follow up required by staff           Psychosocial Re-Evaluation:   Psychosocial Discharge (Final Psychosocial Re-Evaluation):   Vocational Rehabilitation: Provide vocational rehab assistance to qualifying candidates.   Vocational Rehab Evaluation & Intervention:   Education: Education Goals: Education classes will be provided on a variety of topics geared toward better understanding of heart health and risk factor modification. Participant will state understanding/return demonstration of topics presented as noted by education test scores.  Learning Barriers/Preferences:  Learning  Barriers/Preferences - 12/26/19 4196      Learning Barriers/Preferences   Learning Barriers None    Learning Preferences None           General Cardiac  Education Topics:  AED/CPR: - Group verbal and written instruction with the use of models to demonstrate the basic use of the AED with the basic ABC's of resuscitation.   Anatomy and Cardiac Procedures: - Group verbal and visual presentation and models provide information about basic cardiac anatomy and function. Reviews the testing methods done to diagnose heart disease and the outcomes of the test results. Describes the treatment choices: Medical Management, Angioplasty, or Coronary Bypass Surgery for treating various heart conditions including Myocardial Infarction, Angina, Valve Disease, and Cardiac Arrhythmias.  Written material given at graduation.   Medication Safety: - Group verbal and visual instruction to review commonly prescribed medications for heart and lung disease. Reviews the medication, class of the drug, and side effects. Includes the steps to properly store meds and maintain the prescription regimen.  Written material given at graduation.   Intimacy: - Group verbal instruction through game format to discuss how heart and lung disease can affect sexual intimacy. Written material given at graduation..   Know Your Numbers and Heart Failure: - Group verbal and visual instruction to discuss disease risk factors for cardiac and pulmonary disease and treatment options.  Reviews associated critical values for Overweight/Obesity, Hypertension, Cholesterol, and Diabetes.  Discusses basics of heart failure: signs/symptoms and treatments.  Introduces Heart Failure Zone chart for action plan for heart failure.  Written material given at graduation.   Infection Prevention: - Provides verbal and written material to individual with discussion of infection control including proper hand washing and proper equipment cleaning during  exercise session. Flowsheet Row Cardiac Rehab from 01/25/2020 in [facility] Cardiac and Pulmonary Rehab  Date 12/26/19  Educator [facility]  Instruction Review Code 1- Verbalizes Understanding      Falls Prevention: - Provides verbal and written material to individual with discussion of falls prevention and safety. Flowsheet Row Cardiac Rehab from 01/25/2020 in [facility] Cardiac and Pulmonary Rehab  Date 12/26/19  Educator [facility]  Instruction Review Code 1- Verbalizes Understanding      Other: -Provides group and verbal instruction on various topics (see comments)   Knowledge Questionnaire Score:  Knowledge Questionnaire Score - 01/04/20 1352      Knowledge Questionnaire Score   Pre Score 19/26: A&P, Angina, PAD, Exercise, Tobacco           Core Components/Risk Factors/Patient Goals at Admission:  Personal Goals and Risk Factors at Admission - 01/04/20 1348      Core Components/Risk Factors/Patient Goals on Admission    Weight Management Yes;Weight Loss    Intervention Weight Management: Develop a combined nutrition and exercise program designed to reach desired caloric intake, while maintaining appropriate intake of nutrient and fiber, sodium and fats, and appropriate energy expenditure required for the weight goal.;Weight Management: Provide education and appropriate resources to help participant work on and attain dietary goals.;Weight Management/Obesity: Establish reasonable short term and long term weight goals.    Admit Weight 153 lb 1.6 oz (69.4 kg)    Goal Weight: Short Term 150 lb (68 kg)    Goal Weight: Long Term 147 lb (66.7 kg)    Expected Outcomes Short Term: Continue to assess and modify interventions until short term weight is achieved;Long Term: Adherence to nutrition and physical activity/exercise program aimed toward attainment of established weight goal;Understanding recommendations for meals to include 15-35% energy as protein, 25-35% energy from fat, 35-60% energy from  carbohydrates, less than 216m of dietary cholesterol, 20-35 gm of total fiber daily;Understanding of distribution of calorie intake throughout the day with the  consumption of 4-5 meals/snacks;Weight Loss: Understanding of general recommendations for a balanced deficit meal plan, which promotes 1-2 lb weight loss per week and includes a negative energy balance of [phone] kcal/d    Diabetes Yes    Intervention Provide education about signs/symptoms and action to take for hypo/hyperglycemia.;Provide education about proper nutrition, including hydration, and aerobic/resistive exercise prescription along with prescribed medications to achieve blood glucose in normal ranges: Fasting glucose 65-99 mg/dL    Expected Outcomes Short Term: Participant verbalizes understanding of the signs/symptoms and immediate care of hyper/hypoglycemia, proper foot care and importance of medication, aerobic/resistive exercise and nutrition plan for blood glucose control.;Long Term: Attainment of HbA1C < 7%.    Heart Failure Yes    Intervention Provide a combined exercise and nutrition program that is supplemented with education, support and counseling about heart failure. Directed toward relieving symptoms such as shortness of breath, decreased exercise tolerance, and extremity edema.    Expected Outcomes Improve functional capacity of life;Short term: Attendance in program 2-3 days a week with increased exercise capacity. Reported lower sodium intake. Reported increased fruit and vegetable intake. Reports medication compliance.;Short term: Daily weights obtained and reported for increase. Utilizing diuretic protocols set by physician.;Long term: Adoption of self-care skills and reduction of barriers for early signs and symptoms recognition and intervention leading to self-care maintenance.    Hypertension Yes    Intervention Provide education on lifestyle modifcations including regular physical activity/exercise, weight  management, moderate sodium restriction and increased consumption of fresh fruit, vegetables, and low fat dairy, alcohol moderation, and smoking cessation.;Monitor prescription use compliance.    Expected Outcomes Short Term: Continued assessment and intervention until BP is < 140/47m HG in hypertensive participants. < 130/843mHG in hypertensive participants with diabetes, heart failure or chronic kidney disease.;Long Term: Maintenance of blood pressure at goal levels.    Lipids Yes    Intervention Provide education and support for participant on nutrition & aerobic/resistive exercise along with prescribed medications to achieve LDL <7071mHDL >50m45m  Expected Outcomes Short Term: Participant states understanding of desired cholesterol values and is compliant with medications prescribed. Participant is following exercise prescription and nutrition guidelines.;Long Term: Cholesterol controlled with medications as prescribed, with individualized exercise RX and with personalized nutrition plan. Value goals: LDL < 70mg68mL > 40 mg.    Stress Yes    Intervention Offer individual and/or small group education and counseling on adjustment to heart disease, stress management and health-related lifestyle change. Teach and support self-help strategies.;Refer participants experiencing significant psychosocial distress to appropriate mental health specialists for further evaluation and treatment. When possible, include family members and significant others in education/counseling sessions.    Expected Outcomes Short Term: Participant demonstrates changes in health-related behavior, relaxation and other stress management skills, ability to obtain effective social support, and compliance with psychotropic medications if prescribed.;Long Term: Emotional wellbeing is indicated by absence of clinically significant psychosocial distress or social isolation.           Education:Diabetes - Individual verbal and  written instruction to review signs/symptoms of diabetes, desired ranges of glucose level fasting, after meals and with exercise. Acknowledge that pre and post exercise glucose checks will be done for 3 sessions at entry of program. [facility]  Date 12/26/19  Educator [facility] Advocat[facility]ruction Review Code 1- Verbalizes Understanding      Core Components/Risk Factors/Patient Goals Review:    Core Components/Risk Factors/Patient Goals at Discharge (Final  Review):    ITP Comments:  ITP Comments    Row Name 12/26/19 0842 01/04/20 1201 01/11/20 0750 01/29/20 1130 01/31/20 0726   ITP Comments Virtual Visit completed. Patient informed on EP and RD appointment and 6 Minute walk test. Patient also informed of patient health questionnaires on My Chart. Patient Verbalizes understanding. Visit diagnosis can be found in [facility] 12/13/2019. Completed 6MWT and gym orientation. Initial ITP created and sent for review to Dr. Emily Filbert, Medical Director. First full day of exercise!  Patient was oriented to gym and equipment including functions, settings, policies, and procedures.  Patient's individual exercise prescription and treatment plan were reviewed.  All starting workloads were established based on the results of the 6 minute walk test done at initial orientation visit.  The plan for exercise progression was also introduced and progression will be customized based on patient's performance and goals. [name] called to inform staff she was still in the hospital at [facility] for a GI bleed. They are running tests and she said she expects to be there for 10 days. Staff will follow up in two weeks to see how she is feeling. 30 Day review completed. Medical Director ITP review done, changes made as directed, and signed approval by Medical Director.          Comments:

## 2020-02-01 DIAGNOSIS — I2510 Atherosclerotic heart disease of native coronary artery without angina pectoris: Secondary | ICD-10-CM | POA: Diagnosis not present

## 2020-02-01 DIAGNOSIS — I208 Other forms of angina pectoris: Secondary | ICD-10-CM | POA: Diagnosis not present

## 2020-02-01 DIAGNOSIS — K922 Gastrointestinal hemorrhage, unspecified: Secondary | ICD-10-CM | POA: Diagnosis not present

## 2020-02-01 DIAGNOSIS — Z9884 Bariatric surgery status: Secondary | ICD-10-CM | POA: Diagnosis not present

## 2020-02-01 IMAGING — MR MR MRA HEAD W/O CM
1 series · 19 of 48 positions shown · non-contrast
Comparison: 04/09/2017 MRA head

CLINICAL DATA: 47 y/o F; history of stroke with left-sided
weakness.

EXAM:
MRA HEAD WITHOUT CONTRAST
TECHNIQUE: Angiographic images of the Circle of Willis were obtained using MRA
technique without intravenous contrast.

[Series 5: TOF · axial · 0.5mm · 0.41mm/px · z∈[-109,-13]mm · 19 of 205 slices shown]
[im 1/205]
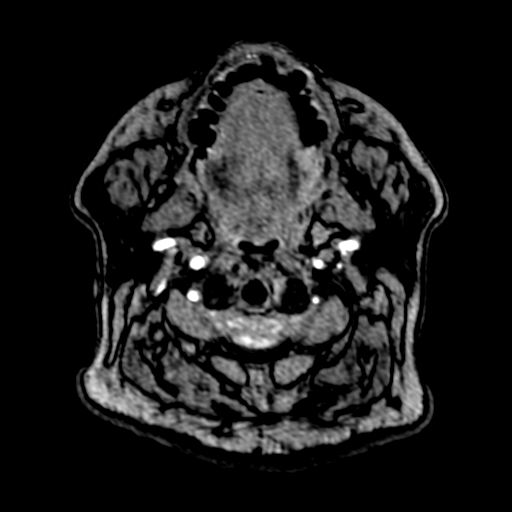
[im 5/205]
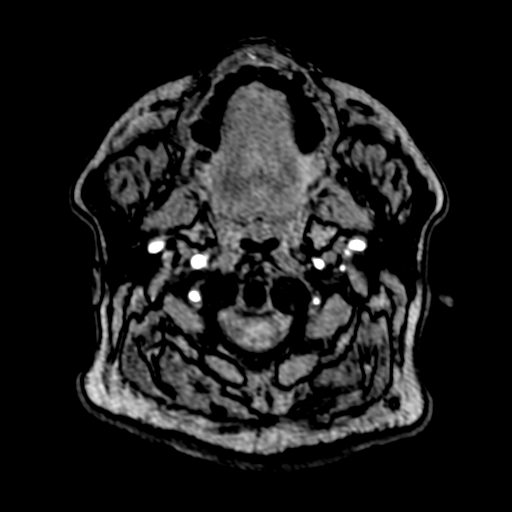
[im 9/205]
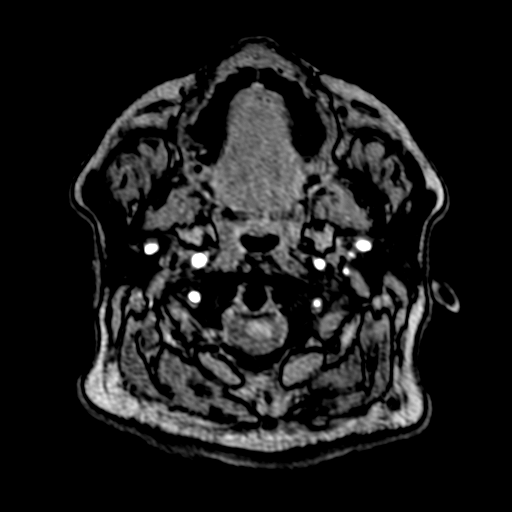
[im 14/205]
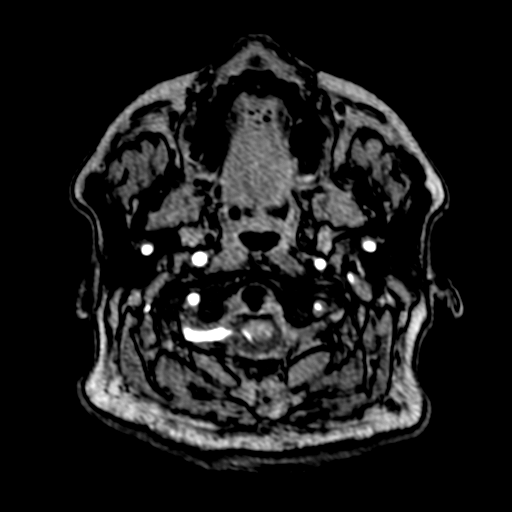
[im 18/205]
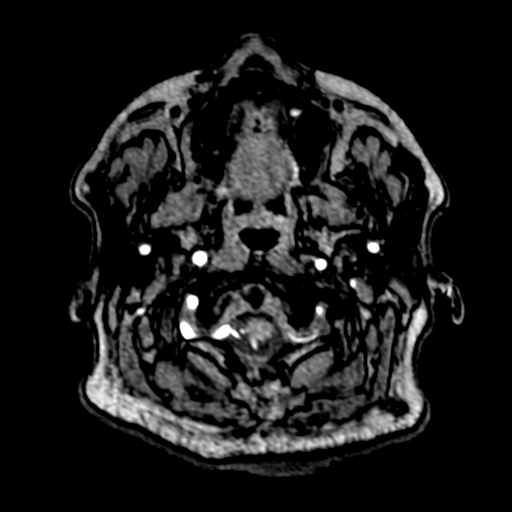
[im 22/205]
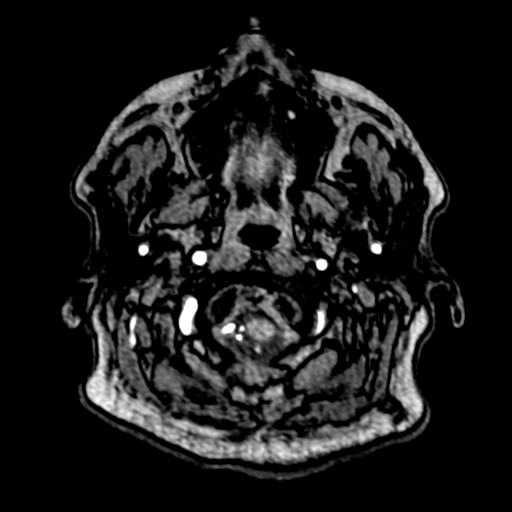
[im 27/205]
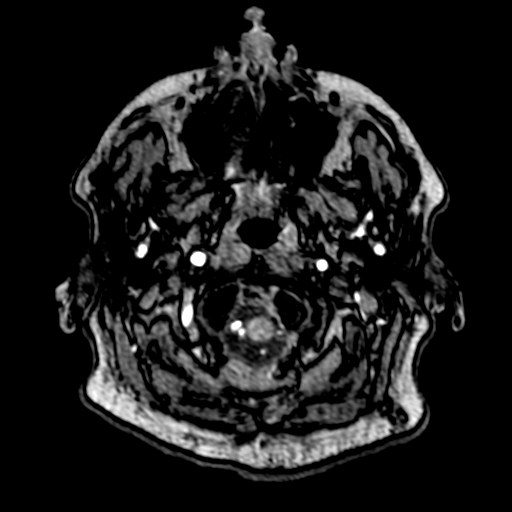
[im 31/205]
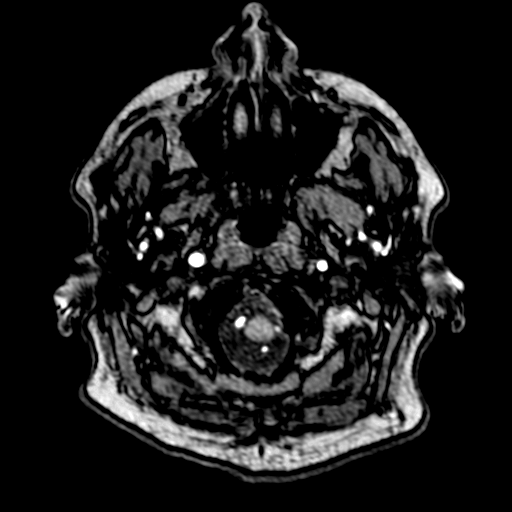
[im 35/205]
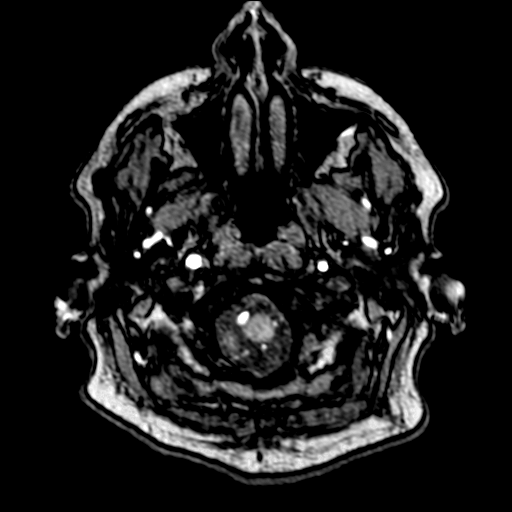
[im 40/205]
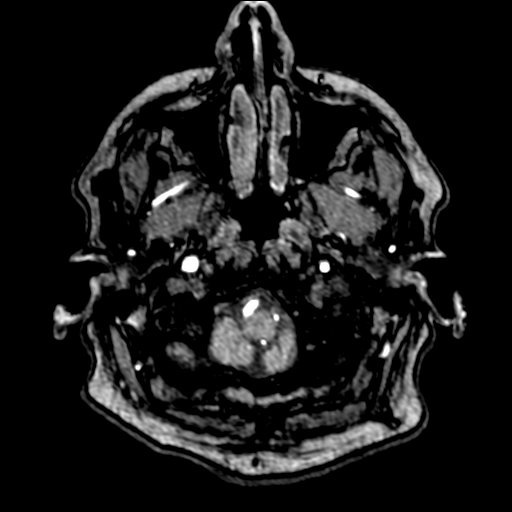
[im 44/205]
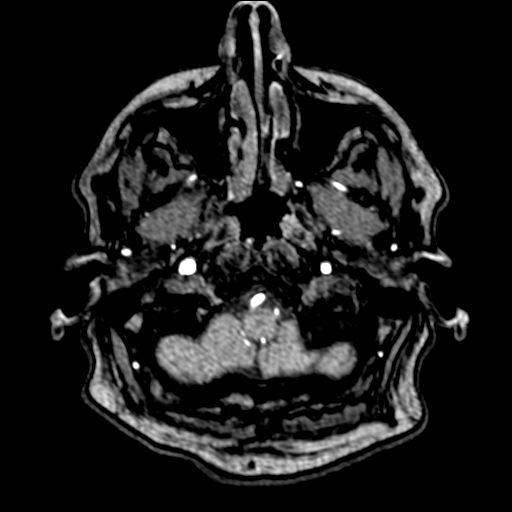
[im 66/205]
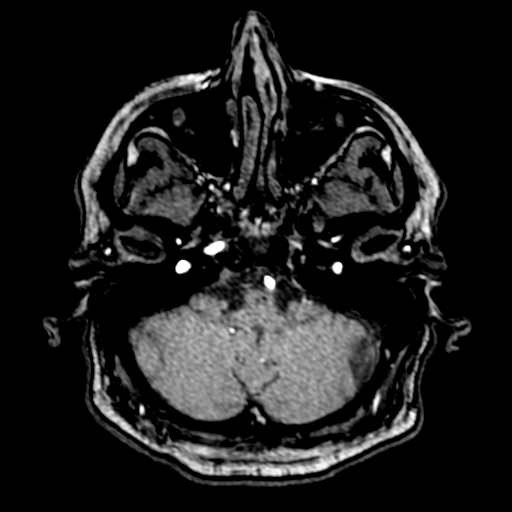
[im 92/205]
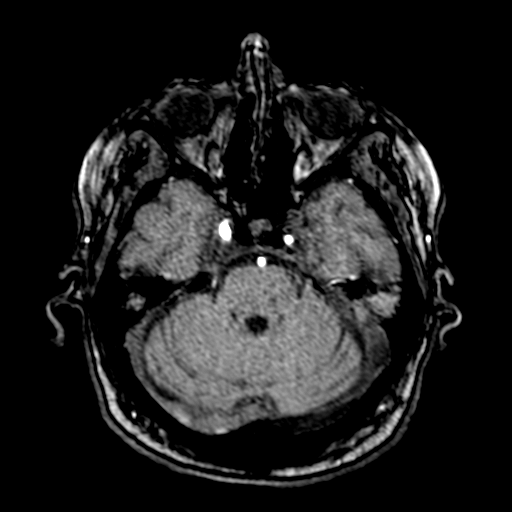
[im 105/205]
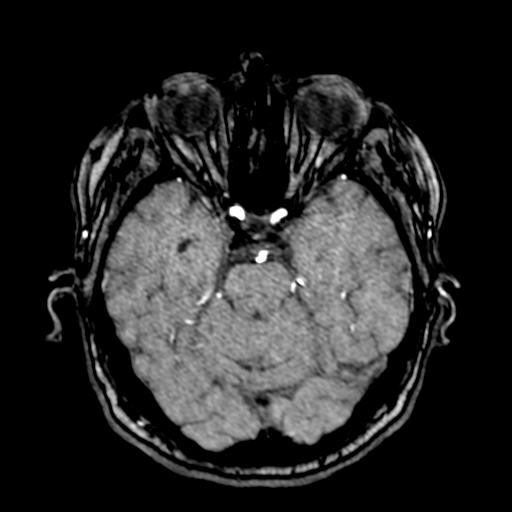
[im 118/205]
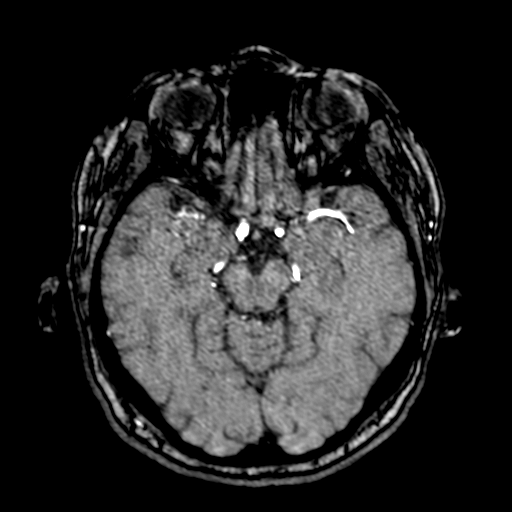
[im 144/205]
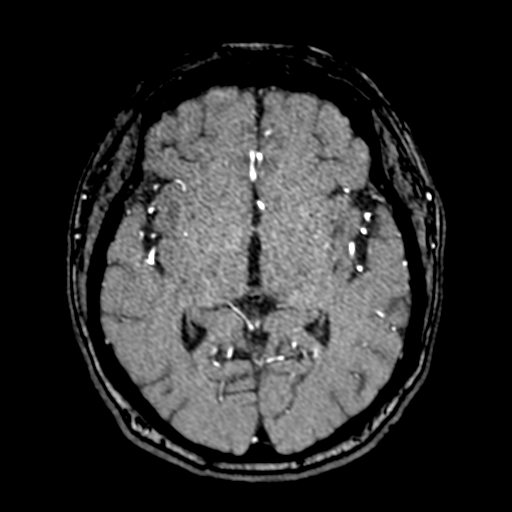
[im 170/205]
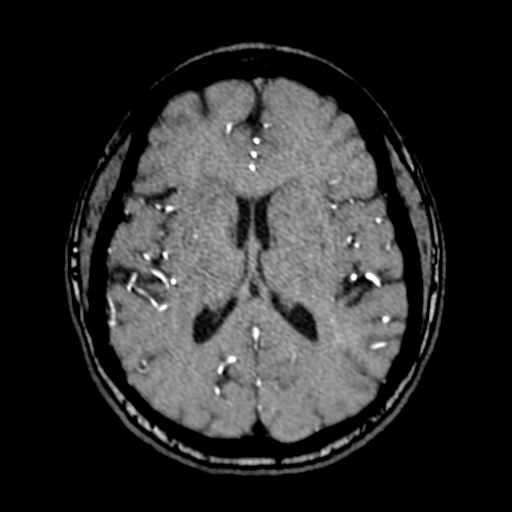
[im 174/205]
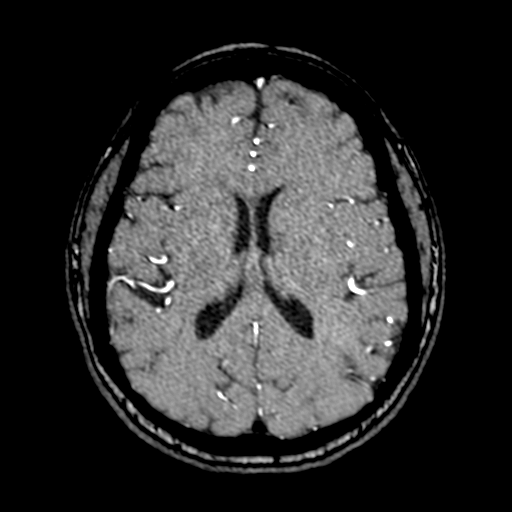
[im 196/205]
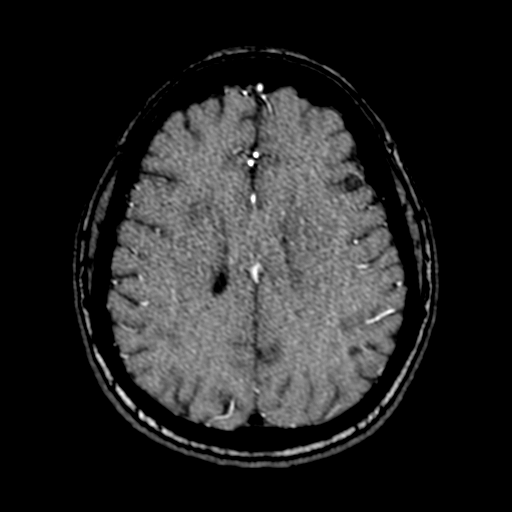

[19 of 48 positions shown; findings below may reference images not displayed]

FINDINGS: Internal carotid arteries: Patent. Mild non stenotic irregularity of
carotid siphons compatible with atherosclerosis.

Anterior cerebral arteries: Patent. Large right A1 and anterior
communicating artery with hypoplastic left A1, normal variant.

Middle cerebral arteries: Patent. Left ICA with small in caliber in
comparison with the right due to decreased flow given anatomic
variation.

Anterior communicating artery: Patent.

Posterior communicating arteries: Fetal right PCA. Probable
diminutive left posterior communicating artery.

Posterior cerebral arteries: Tandem segments of moderate to severe
left P2 stenosis. Downstream left PCA and the right PCA are
otherwise widely patent.

Basilar artery:  Patent.

Vertebral arteries:  Patent.  Right dominant.

No additional evidence of high-grade stenosis, large vessel
occlusion, or aneurysm.
IMPRESSION: 1. Stable MRA of the head.  No large vessel occlusion or aneurysm.
2. Stable tandem segments of moderate to severe left P2 stenosis.

## 2020-02-02 DIAGNOSIS — T457X1A Poisoning by anticoagulant antagonists, vitamin K and other coagulants, accidental (unintentional), initial encounter: Secondary | ICD-10-CM | POA: Diagnosis not present

## 2020-02-02 DIAGNOSIS — D689 Coagulation defect, unspecified: Secondary | ICD-10-CM | POA: Diagnosis not present

## 2020-02-02 DIAGNOSIS — K922 Gastrointestinal hemorrhage, unspecified: Secondary | ICD-10-CM | POA: Diagnosis not present

## 2020-02-02 DIAGNOSIS — R1312 Dysphagia, oropharyngeal phase: Secondary | ICD-10-CM | POA: Diagnosis not present

## 2020-02-02 DIAGNOSIS — I208 Other forms of angina pectoris: Secondary | ICD-10-CM | POA: Diagnosis not present

## 2020-02-02 DIAGNOSIS — I2510 Atherosclerotic heart disease of native coronary artery without angina pectoris: Secondary | ICD-10-CM | POA: Diagnosis not present

## 2020-02-02 IMAGING — MR MR MRA NECK W/O CM
1 of 3 series · 18 of 48 positions shown · non-contrast
Comparison: MRI head 02/17/2018.  MRA head 02/18/2018

CLINICAL DATA: Stroke, slurred speech

EXAM:
MRA NECK WITHOUT  CONTRAST
TECHNIQUE: Multiplanar and multiecho pulse sequences of the neck were obtained
without intravenous contrast. Angiographic images of the neck were
obtained using MRA technique without and with intravenous contrast.

[Series 9: angio_fl3d_cor_pre_ttc=2.0s · coronal · B · 0.9mm · 0.85mm/px · 18 of 96 slices shown]
[im 1/96]
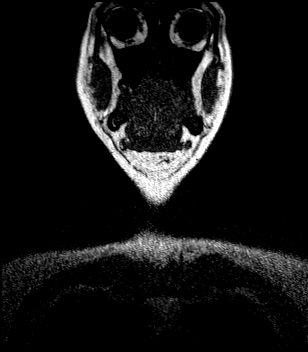
[im 6/96]
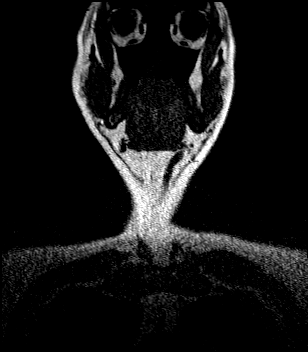
[im 12/96]
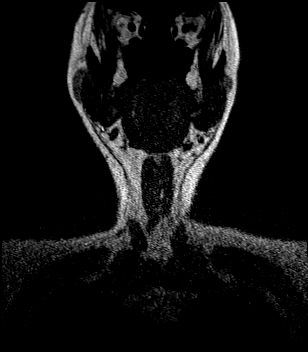
[im 17/96]
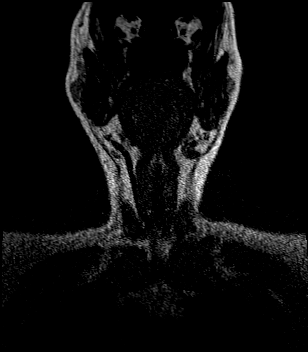
[im 23/96]
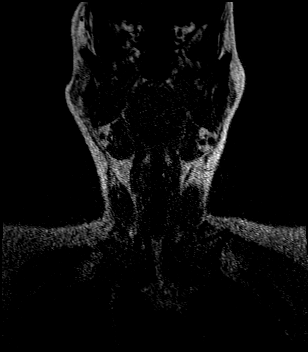
[im 28/96]
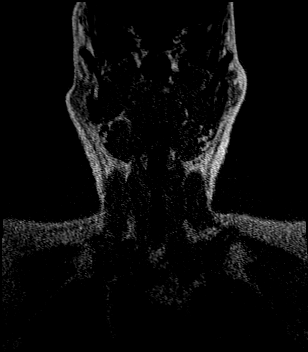
[im 34/96]
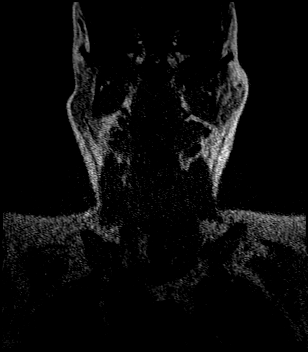
[im 40/96]
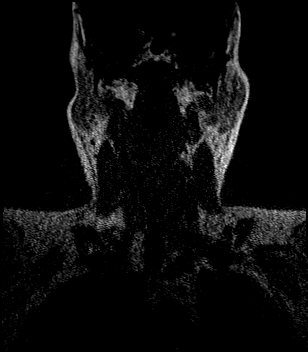
[im 45/96]
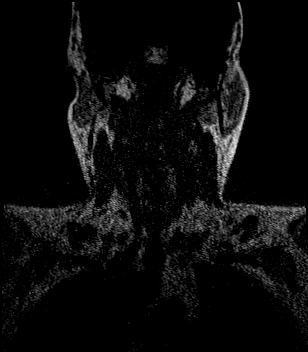
[im 51/96]
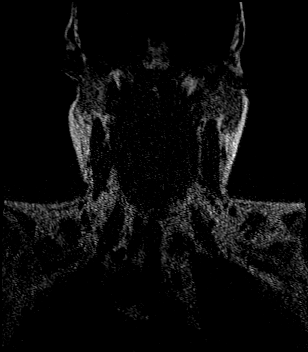
[im 56/96]
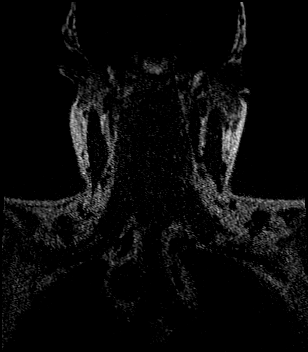
[im 62/96]
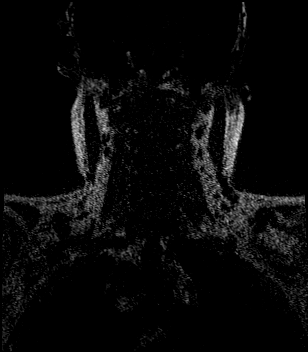
[im 68/96]
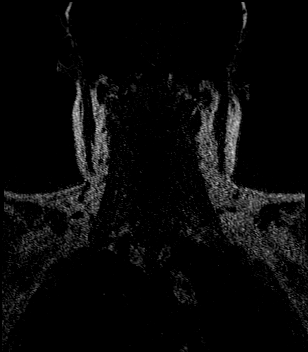
[im 73/96]
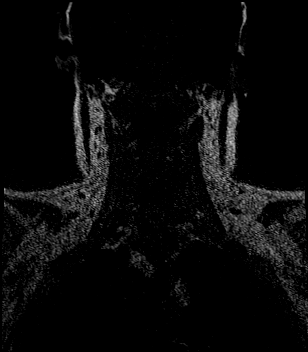
[im 79/96]
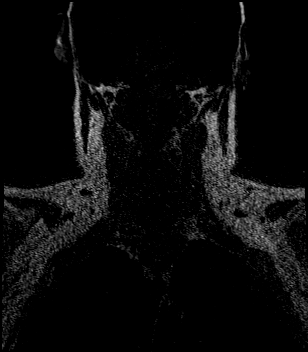
[im 84/96]
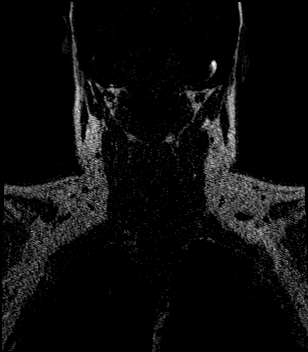
[im 90/96]
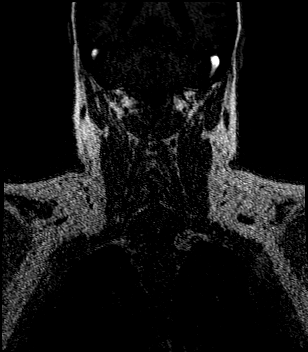
[im 96/96]
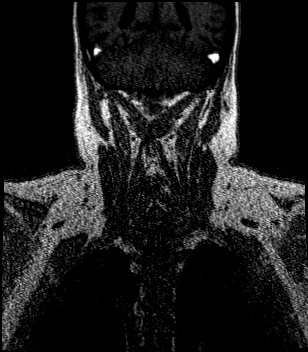

[18 of 48 positions shown; findings below may reference images not displayed]

FINDINGS: Small thready left vertebral artery which appears diffusely diseased
but patent. Dominant right vertebral artery widely patent. Antegrade
flow in both vertebral arteries.

Right carotid widely patent.  No significant stenosis

Left carotid bifurcation not visualized and may be low or possibly
separate origins. Left internal and external carotid artery are
patent through the segments evaluated.
IMPRESSION: Thready left vertebral artery which is diffusely disease. Dominant
right vertebral artery widely patent

No significant carotid stenosis. Left carotid bifurcation not
visualized and may be low or have separate origins.

## 2020-02-03 DIAGNOSIS — R1312 Dysphagia, oropharyngeal phase: Secondary | ICD-10-CM | POA: Diagnosis not present

## 2020-02-05 ENCOUNTER — Telehealth: Payer: Self-pay

## 2020-02-05 DIAGNOSIS — T457X1A Poisoning by anticoagulant antagonists, vitamin K and other coagulants, accidental (unintentional), initial encounter: Secondary | ICD-10-CM | POA: Diagnosis not present

## 2020-02-05 DIAGNOSIS — D689 Coagulation defect, unspecified: Secondary | ICD-10-CM | POA: Diagnosis not present

## 2020-02-05 DIAGNOSIS — K922 Gastrointestinal hemorrhage, unspecified: Secondary | ICD-10-CM | POA: Diagnosis not present

## 2020-02-05 DIAGNOSIS — R1312 Dysphagia, oropharyngeal phase: Secondary | ICD-10-CM | POA: Diagnosis not present

## 2020-02-05 DIAGNOSIS — Z955 Presence of coronary angioplasty implant and graft: Secondary | ICD-10-CM

## 2020-02-05 NOTE — Telephone Encounter (Signed)
[name] called to say she is still in hospital.  She has a procedure tomorrow.  Reminded her to get clearance to return to exercise before she comes in.

## 2020-02-06 ENCOUNTER — Other Ambulatory Visit: Payer: Self-pay | Admitting: Family Medicine

## 2020-02-06 DIAGNOSIS — R1312 Dysphagia, oropharyngeal phase: Secondary | ICD-10-CM | POA: Diagnosis not present

## 2020-02-06 DIAGNOSIS — D689 Coagulation defect, unspecified: Secondary | ICD-10-CM | POA: Diagnosis not present

## 2020-02-06 DIAGNOSIS — T457X1A Poisoning by anticoagulant antagonists, vitamin K and other coagulants, accidental (unintentional), initial encounter: Secondary | ICD-10-CM | POA: Diagnosis not present

## 2020-02-06 DIAGNOSIS — K922 Gastrointestinal hemorrhage, unspecified: Secondary | ICD-10-CM | POA: Diagnosis not present

## 2020-02-06 DIAGNOSIS — R933 Abnormal findings on diagnostic imaging of other parts of digestive tract: Secondary | ICD-10-CM | POA: Diagnosis not present

## 2020-02-06 DIAGNOSIS — K921 Melena: Secondary | ICD-10-CM | POA: Diagnosis not present

## 2020-02-06 DIAGNOSIS — Z980 Intestinal bypass and anastomosis status: Secondary | ICD-10-CM | POA: Diagnosis not present

## 2020-02-06 HISTORY — PX: BALLOON ENTEROSCOPY: SHX6863

## 2020-02-06 NOTE — Telephone Encounter (Signed)
Requested medication (s) are due for refill today: Amount not specified  Requested medication (s) are on the active medication list: yes  Last refill: 08/22/19  Amount unknown  Future visit scheduled  no  Notes to clinic: Historical provider  Requested Prescriptions  Pending Prescriptions Disp Refills   promethazine (PHENERGAN) 25 MG tablet [Pharmacy Med Name: Promethazine HCl 25 MG Oral Tablet] 30 tablet 0    Sig: TAKE 1 TABLET BY MOUTH EVERY 6 HOURS AS NEEDED FOR NAUSEA FOR VOMITING      Not Delegated - Gastroenterology: Antiemetics Failed - 02/06/2020  6:59 PM      Failed - This refill cannot be delegated      Passed - Valid encounter within last 6 months    Recent Outpatient Visits           4 weeks ago Dyspnea on exertion   [facility] [name], MD   2 months ago Acute pulmonary embolism without acute cor pulmonale, unspecified pulmonary embolism type [facility])   [facility] [name], MD   2 months ago Acute pulmonary embolism without acute cor pulmonale, unspecified pulmonary embolism type [facility])   [facility] [name], MD   4 months ago Dysphagia, unspecified type   [facility] [name], MD   5 months ago [place], Donald E, MD       Future Appointments             In 2 weeks Gollan, Kathlene November, MD [facility]   In 1 month [name], MD [place]

## 2020-02-07 DIAGNOSIS — K922 Gastrointestinal hemorrhage, unspecified: Secondary | ICD-10-CM | POA: Diagnosis not present

## 2020-02-07 DIAGNOSIS — T457X1A Poisoning by anticoagulant antagonists, vitamin K and other coagulants, accidental (unintentional), initial encounter: Secondary | ICD-10-CM | POA: Diagnosis not present

## 2020-02-07 DIAGNOSIS — D689 Coagulation defect, unspecified: Secondary | ICD-10-CM | POA: Diagnosis not present

## 2020-02-07 DIAGNOSIS — I2510 Atherosclerotic heart disease of native coronary artery without angina pectoris: Secondary | ICD-10-CM | POA: Diagnosis not present

## 2020-02-07 DIAGNOSIS — R1312 Dysphagia, oropharyngeal phase: Secondary | ICD-10-CM | POA: Diagnosis not present

## 2020-02-08 DIAGNOSIS — E162 Hypoglycemia, unspecified: Secondary | ICD-10-CM | POA: Diagnosis not present

## 2020-02-08 DIAGNOSIS — K922 Gastrointestinal hemorrhage, unspecified: Secondary | ICD-10-CM | POA: Diagnosis not present

## 2020-02-08 DIAGNOSIS — I2510 Atherosclerotic heart disease of native coronary artery without angina pectoris: Secondary | ICD-10-CM | POA: Diagnosis not present

## 2020-02-09 DIAGNOSIS — I2510 Atherosclerotic heart disease of native coronary artery without angina pectoris: Secondary | ICD-10-CM | POA: Diagnosis not present

## 2020-02-09 DIAGNOSIS — R1312 Dysphagia, oropharyngeal phase: Secondary | ICD-10-CM | POA: Diagnosis not present

## 2020-02-09 DIAGNOSIS — K922 Gastrointestinal hemorrhage, unspecified: Secondary | ICD-10-CM | POA: Diagnosis not present

## 2020-02-09 DIAGNOSIS — E162 Hypoglycemia, unspecified: Secondary | ICD-10-CM | POA: Diagnosis not present

## 2020-02-10 DIAGNOSIS — E11649 Type 2 diabetes mellitus with hypoglycemia without coma: Secondary | ICD-10-CM | POA: Diagnosis not present

## 2020-02-12 ENCOUNTER — Encounter: Payer: Self-pay | Admitting: Family Medicine

## 2020-02-12 DIAGNOSIS — E1142 Type 2 diabetes mellitus with diabetic polyneuropathy: Secondary | ICD-10-CM | POA: Diagnosis not present

## 2020-02-12 DIAGNOSIS — E162 Hypoglycemia, unspecified: Secondary | ICD-10-CM | POA: Diagnosis not present

## 2020-02-13 ENCOUNTER — Encounter: Payer: Self-pay | Admitting: Family Medicine

## 2020-02-13 NOTE — Telephone Encounter (Signed)
She can have the 3:30 slot Monday afternoon.

## 2020-02-16 ENCOUNTER — Other Ambulatory Visit: Payer: Self-pay | Admitting: Family Medicine

## 2020-02-16 ENCOUNTER — Encounter: Payer: Self-pay | Admitting: Family Medicine

## 2020-02-16 DIAGNOSIS — F419 Anxiety disorder, unspecified: Secondary | ICD-10-CM

## 2020-02-16 NOTE — Telephone Encounter (Signed)
Requested medication (s) are due for refill today:   Provider to determine  Requested medication (s) are on the active medication list:   Yes  Future visit scheduled:   Yes   Last ordered: 08/14/2019 #60, 3 refills  Non delegated refill   Requested Prescriptions  Pending Prescriptions Disp Refills   ALPRAZolam (XANAX) 1 MG tablet [Pharmacy Med Name: ALPRAZolam 1 MG Oral Tablet] 60 tablet 0    Sig: Take 1 tablet by mouth twice daily as needed      Not Delegated - Psychiatry:  Anxiolytics/Hypnotics Failed - 02/16/2020  8:28 AM      Failed - This refill cannot be delegated      Passed - Urine Drug Screen completed in last 360 days      Passed - Valid encounter within last 6 months    Recent Outpatient Visits           1 month ago Dyspnea on exertion   [facility] [name], MD   2 months ago Acute pulmonary embolism without acute cor pulmonale, unspecified pulmonary embolism type [facility])   [facility] [name], MD   3 months ago Acute pulmonary embolism without acute cor pulmonale, unspecified pulmonary embolism type [facility])   [facility] [name], MD   4 months ago Dysphagia, unspecified type   [facility] [name], MD   5 months ago [place], Donald E, MD       Future Appointments             In 1 week Gollan, Kathlene November, MD [facility], [facility]   In 3 weeks [name], MD [place]

## 2020-02-19 ENCOUNTER — Ambulatory Visit (INDEPENDENT_AMBULATORY_CARE_PROVIDER_SITE_OTHER): Payer: Medicaid Other | Admitting: Family Medicine

## 2020-02-19 ENCOUNTER — Encounter: Payer: Self-pay | Admitting: Family Medicine

## 2020-02-19 ENCOUNTER — Encounter: Payer: Self-pay | Admitting: *Deleted

## 2020-02-19 DIAGNOSIS — D649 Anemia, unspecified: Secondary | ICD-10-CM | POA: Diagnosis not present

## 2020-02-19 DIAGNOSIS — R42 Dizziness and giddiness: Secondary | ICD-10-CM | POA: Diagnosis not present

## 2020-02-19 DIAGNOSIS — Z955 Presence of coronary angioplasty implant and graft: Secondary | ICD-10-CM

## 2020-02-19 NOTE — Progress Notes (Signed)
Virtual telephone visit    Virtual Visit via Telephone Note   This visit type was conducted due to national recommendations for restrictions regarding the COVID-19 Pandemic (e.g. social distancing) in an effort to limit this patient's exposure and mitigate transmission in our community. Due to her co-morbid illnesses, this patient is at least at moderate risk for complications without adequate follow up. This format is felt to be most appropriate for this patient at this time. The patient did not have access to video technology or had technical difficulties with video requiring transitioning to audio format only (telephone). Physical exam was limited to content and character of the telephone converstion.    Patient location: home Provider location: [facility]  I discussed the limitations of evaluation and management by telemedicine and the availability of in person appointments. The patient expressed understanding and agreed to proceed.   Visit Date: 02/19/2020  Today's healthcare provider: [name], MD   No chief complaint on file.  Subjective    HPI   Complains she started having diarrhea since getting out of hospital, but started feeling more dizzy today.  Discharged from [place] on 02/10/2020 GI prescribed medication for diarrhea which is working. Was also started on famotidine on carvedilol was increased from 12.5mg  twice a day to 25mg  twice a day.  Has poor appetite since hospitalization, but is getting in a 2-3 Boost every day.  States she had some heart issues at [facility].     Medications: Outpatient Medications Prior to Visit  Medication Sig  . ALPRAZolam (XANAX) 1 MG tablet Take 1 tablet by mouth twice daily as needed  . amLODipine (NORVASC) 2.5 MG tablet Take 1 tablet (2.5 mg total) by mouth daily. (Patient taking differently: Take 2.5 mg by mouth every evening.)  . buPROPion (WELLBUTRIN SR) 200 MG 12 hr tablet Take 200 mg by mouth every morning.  . carvedilol (COREG) 12.5  MG tablet Take 1 tablet (12.5 mg total) by mouth 2 (two) times daily with a meal.  . desvenlafaxine (PRISTIQ) 50 MG 24 hr tablet Take 50 mg by mouth daily.  . diphenoxylate-atropine (LOMOTIL) 2.5-0.025 MG tablet Take 1 tablet by mouth 4 (four) times daily as needed for diarrhea or loose stools.  Arna Medici 25 MCG tablet TAKE 1 TABLET BY MOUTH ONCE DAILY BEFORE BREAKFAST (Patient taking differently: Take 25 mcg by mouth daily before breakfast.)  . gabapentin (NEURONTIN) 300 MG capsule Take 2 capsules (600 mg total) by mouth 2 (two) times daily.  . isosorbide mononitrate (IMDUR) 120 MG 24 hr tablet Take 1 tablet (120 mg total) by mouth daily.  . nitroGLYCERIN (NITROSTAT) 0.4 MG SL tablet Place 1 tablet (0.4 mg total) under the tongue every 5 (five) minutes as needed for chest pain.  . pantoprazole (PROTONIX) 40 MG tablet Take 1 tablet by mouth twice daily (Patient taking differently: Take 40 mg by mouth 2 (two) times daily.)  . Polyethyl Glycol-Propyl Glycol (SYSTANE OP) Place 1 drop into both eyes daily as needed (dry eyes).  . promethazine (PHENERGAN) 25 MG tablet TAKE 1 TABLET BY MOUTH EVERY 6 HOURS AS NEEDED FOR NAUSEA FOR VOMITING  . rosuvastatin (CRESTOR) 40 MG tablet Take 1 tablet (40 mg total) by mouth daily at 6 PM.  . traZODone (DESYREL) 100 MG tablet TAKE 1 TO 2 TABLETS BY MOUTH AT BEDTIME (Patient taking differently: Take 100-200 mg by mouth at bedtime as needed for sleep.)  . UBRELVY 100 MG TABS Take 100 mg by mouth 2 (two) times  daily as needed (migraine).    No facility-administered medications prior to visit.    Review of Systems    Objective    There were no vitals taken for this visit.  Awake, alert, oriented x 3. In no apparent distress    Assessment & Plan     1. Anemia, unspecified type  - CBC; Future  2. Dizziness  - CBC; Future - Comprehensive metabolic panel; Future  Other orders - famotidine (PEPCID) 20 MG tablet; Take 20 mg by mouth 2 (two) times daily.    No follow-ups on file.    I discussed the assessment and treatment plan with the patient. The patient was provided an opportunity to ask questions and all were answered. The patient agreed with the plan and demonstrated an understanding of the instructions.   The patient was advised to call back or seek an in-person evaluation if the symptoms worsen or if the condition fails to improve as anticipated.  I provided 12 minutes of non-face-to-face time during this encounter.  The entirety of the information documented in the History of Present Illness, Review of Systems and Physical Exam were personally obtained by me. Portions of this information were initially documented by the CMA and reviewed by me for thoroughness and accuracy.     [name], MD [facility] [phone] (phone) [phone] (fax)  [place]

## 2020-02-20 ENCOUNTER — Other Ambulatory Visit: Payer: Self-pay

## 2020-02-20 DIAGNOSIS — D649 Anemia, unspecified: Secondary | ICD-10-CM

## 2020-02-20 DIAGNOSIS — R42 Dizziness and giddiness: Secondary | ICD-10-CM | POA: Diagnosis not present

## 2020-02-21 ENCOUNTER — Ambulatory Visit: Payer: BC Managed Care – PPO | Admitting: Cardiovascular Disease

## 2020-02-21 LAB — CBC
Hematocrit: 33.1 % — ABNORMAL LOW (ref 34.0–46.6)
Hemoglobin: 10.6 g/dL — ABNORMAL LOW (ref 11.1–15.9)
MCH: 27.4 pg (ref 26.6–33.0)
MCHC: 32 g/dL (ref 31.5–35.7)
MCV: 86 fL (ref 79–97)
Platelets: 341 10*3/uL (ref 150–450)
RBC: 3.87 x10E6/uL (ref 3.77–5.28)
RDW: 14.3 % (ref 11.7–15.4)
WBC: 8.3 10*3/uL (ref 3.4–10.8)

## 2020-02-21 LAB — COMPREHENSIVE METABOLIC PANEL
ALT: 19 IU/L (ref 0–32)
AST: 17 IU/L (ref 0–40)
Albumin/Globulin Ratio: 1.3 (ref 1.2–2.2)
Albumin: 3.6 g/dL — ABNORMAL LOW (ref 3.8–4.9)
Alkaline Phosphatase: 131 IU/L — ABNORMAL HIGH (ref 44–121)
BUN/Creatinine Ratio: 12 (ref 9–23)
BUN: 23 mg/dL (ref 6–24)
Bilirubin Total: <0.2 mg/dL (ref 0.0–1.2)
CO2: 21 mmol/L (ref 20–29)
Calcium: 9 mg/dL (ref 8.7–10.2)
Chloride: 102 mmol/L (ref 96–106)
Creatinine, Ser: 1.94 mg/dL — ABNORMAL HIGH (ref 0.57–1.00)
GFR calc Af Amer: 34 mL/min/{1.73_m2} — ABNORMAL LOW (ref >59)
GFR calc non Af Amer: 29 mL/min/{1.73_m2} — ABNORMAL LOW (ref >59)
Globulin, Total: 2.8 g/dL (ref 1.5–4.5)
Glucose: 136 mg/dL — ABNORMAL HIGH (ref 65–99)
Potassium: 5.8 mmol/L — ABNORMAL HIGH (ref 3.5–5.2)
Sodium: 137 mmol/L (ref 134–144)
Total Protein: 6.4 g/dL (ref 6.0–8.5)

## 2020-02-22 ENCOUNTER — Encounter: Payer: Medicare Other | Attending: Cardiovascular Disease

## 2020-02-22 DIAGNOSIS — Z955 Presence of coronary angioplasty implant and graft: Secondary | ICD-10-CM | POA: Insufficient documentation

## 2020-02-22 DIAGNOSIS — Z20822 Contact with and (suspected) exposure to covid-19: Secondary | ICD-10-CM | POA: Diagnosis not present

## 2020-02-25 NOTE — Progress Notes (Signed)
Evaluation Performed:  Follow-up visit  Date:  02/26/2020   ID:  [address], [place] 1970-10-21, MRN [id number]  Patient Location:  [address] [place] [address]   Provider location:   [facility], [place] office  PCP:  [name], MD  Cardiologist:  [facility]  Chief Complaint  Patient presents with  . Other    Follow up post Recent Hospital visit - Has questions about Plavix. Meds reviewed verbally with patient.     History of Present Illness:    [name] is a 50 y.o. female who presents via audio/video conferencing for a telehealth visit today.   The patient does not symptoms concerning for COVID-19 infection (fever, chills, cough, or new SHORTNESS OF BREATH).   Patient has a past medical history of smoking,  strokes, 2010 chest pain dating back to 2005  CAD, Previous LAD stent Catheterization May 2017 with stent placed to her first diagonal, there was residual 50% distal LAD disease, 30% proximal, 40% proximal circumflex normal LV function,  s/p gastric bypass surgery, Has no stomach Baseline creatinine 1.5, previous ly taken off her diuretic Labile pressures GI bleed 12/21 into 02/2020, 10 units transfused Who presents for routine followup of her coronary artery disease  Hospital records reviewed, 12/21 to 02/2020 balloon-assisted enteroscopy formanagement of anUGI bleed. Localization with nuclear imaging and CT scan suggested the source in the bypassed stomach or proximal duodenum.   hospitalized at [facility] with GI bleed, anemia and malnutrition. She was treated with 4 units of packed red blood cells and short-term enteral nutrition  12/29: The patient underwent a deep enteroscopy with GI, unable to reach excluded stomach. She was then scheduled for a double balloon enteroscopy on 1/4 at [facility]. 12/30: Patient experienced angina and cardiology was consulted. Echo overall  reassuring, EKG with sinus tach, trop neg, restarted plavix. Had large bloody BM after plavix, so d/c'd, continue ASA. Transfused 3U PRBCs, CTA without e/o active bleeding. 12/31: CP resolved, 2 small melanotic stools o/n, Hgb stable 7.5 1/1: Hb 6.9, 2 small melanotic in last 24 hours, 1 u pRBC given + 20 lasix. Brief anginal episode.  1/2: Hb stable at 8.1 . Swelling at PICC line, TPN held, BCx drawn. Has been afebrile, no WBC, line kept in place until infection is proven otherwise 1/3: BCx x2 neg at 24hrs. 1 melatonic stool yesterday. Hgb stable 8.7 1/4: Double balloon enteroscopy did not identify bleeding source 1/5: Started cangrelor and will monitor for rebleed. Rpt CTA if rebleeds. No plan to restart TPN, remove PICC. 1/6: Hgb stable, no signs of bleeding. Stop cangrelor gtt, restart Plavix. 1/7: Hgb stable, cont Plavix + ASA. Experiencing episodes of hypoglycemia + drowsiness. DMS c/s.   Still on asa and plavix HGB 10.6  Drinking ensure, weight stable, Up from 140 to 153 today  Other Recent events discussed with her, was in the hospital December 2021 for chest pain Hospital records reviewed Cath 12/2019 1.  Patent stents in the mid LAD and first diagonal with mild to moderate in-stent restenosis, stable moderate mid RCA stenosis, stable diffuse disease in the superior branch of the first diagonal, moderate mid LAD stenosis at the bifurcation of second diagonal which has no significant ostial disease.  There is severe progression of stenosis in the mid to distal LAD.  In addition, there is moderately severe disease in the apical LAD. 2.  Normal left ventricular end-diastolic pressure.  3.  Successful angioplasty and drug-eluting stent placement to the mid to distal LAD  Echocardiogram October 2021 Ejection fraction 60%  Prior hospitalizations  hypoglycemia 10/2019 Fever, etiology unclear, blood cultures negative, slightly positive UA but no culture performed Atypical chest  pain    Cath 04/2019   Non-stenotic 1st Diag lesion was previously treated.  Ost 1st Diag lesion is 40% stenosed.  Mid LAD-2 lesion is 5% stenosed.  Ost 2nd Diag to 2nd Diag lesion is 95% stenosed.  Mid LAD-1 lesion is 30% stenosed.  Dist LAD lesion is 80% stenosed.  Prox Cx lesion is 40% stenosed.  Mid RCA lesion is 30% stenosed.   Also with heart catheterization November 2019   Prior CV studies:   The following studies were reviewed today:   Past Medical History:  Diagnosis Date  . Acute colitis 01/27/2017  . Acute pyelonephritis   . Anemia    iron deficiency anemia  . Aortic arch aneurysm ([place])   . BRCA negative 2014  . CAD (coronary artery disease)    a. 08/2003 Cath: LAD 30-40-med Rx; b. 11/2014 PCI: LAD 70m(3.25x23 Xience Alpine DES); c. 06/2015 PCI: D1 (2.25x12 Resolute Integrity DES); d. 06/2017 PCI: Patent mLAD stent, D2 95 (PTCA); e. 09/2017 PCI: D2 99ost (CBA); d. 12/2017 Cath: LM nl, LAD 315m80d (small), D1 40ost, D2 95ost, LCX 40p, RCA 40ost/p->Med rx for D2 given restenosis.  . Colitis 06/03/2015  . Colon polyp   . CVA (cerebral vascular accident) ([facility]   Left side weakness.   . Degenerative tear of glenoid labrum of right shoulder 03/15/2017  . Diabetes mellitus without complication ([facility]  . Family history of breast cancer    BRCA neg 2014  . Femur fracture, left ([facility]9/10/2018  . Gastric ulcer 04/27/2011  . History of echocardiogram    a. 03/2017 Echo: EF 60-65%, no rwma; b. 02/2018 Echo: EF 60-65%, no rwma. Nl RV fxn. No cardiac source of emboli (admitted w/ stroke).  . Hypertension   . Malignant melanoma of skin of scalp ([facility]CM[facility]  . MI, acute, non ST segment elevation ([facility]  . Neuromuscular disorder ([facility]  . S/P drug eluting coronary stent placement 06/04/2015  . Sepsis ([facility]CL[facility]2/14/2019  . St[facility] ([facility]   a. 02/2018 MRI: 60m45mate acute/early subacute L medial frontal lobe inarct; b. 02/2018 MRA No large vessel occlusion or aneurysm. Mod to sev L P2  stenosis. thready L vertebral artery, diffusely dzs'd; c. 02/2018 Carotid U/S: <50% bilat ICA dzs.   Past Surgical History:  Procedure Laterality Date  . APPENDECTOMY    . BALLOON ENTEROSCOPY  02/06/2020   [facility]  . CARDIAC CATHETERIZATION N/A 11/09/2014   Procedure: Coronary Angiography;  Surgeon: TimMinna MerrittsD;  [facility];  Service: Cardiovascular;  Laterality: N/A;  . CARDIAC CATHETERIZATION N/A 11/12/2014   Procedure: Coronary Stent Intervention;  Surgeon: AleIsaias CowmanD;  [facility];  Service: Cardiovascular;  Laterality: N/A;  . CARDIAC CATHETERIZATION N/A 04/18/2015   Procedure: Left Heart Cath and Coronary Angiography;  Surgeon: TimMinna MerrittsD;  [facility];  Service: Cardiovascular;  Laterality: N/A;  . CARDIAC CATHETERIZATION Left 06/04/2015   Procedure: Left Heart Cath and Coronary Angiography;  Surgeon: MuhWellington HampshireD;  [facility];  Service: Cardiovascular;  Laterality: Left;  . CARDIAC CATHETERIZATION N/A 06/04/2015   Procedure: Coronary Stent Intervention;  Surgeon: MuhWellington HampshireD;  Location: [place] CV LAB;  Service: Cardiovascular;  Laterality: N/A;  . CESAREAN SECTION  2001  . CHOLECYSTECTOMY N/A 11/18/2016   Procedure: LAPAROSCOPIC CHOLECYSTECTOMY WITH INTRAOPERATIVE CHOLANGIOGRAM;  Surgeon: Christene Lye, MD;  [facility];  Service: General;  Laterality: N/A;  . COLONOSCOPY WITH PROPOFOL N/A 04/27/2016   Procedure: COLONOSCOPY WITH PROPOFOL;  Surgeon: [name], MD;  Location: [place];  Service: Endoscopy;  Laterality: N/A;  . COLONOSCOPY WITH PROPOFOL N/A 01/12/2018   Procedure: COLONOSCOPY WITH PROPOFOL;  Surgeon: Toledo, Benay Pike, MD;  Location: [facility];  Service: Endoscopy;  Laterality: N/A;  . CORONARY ANGIOPLASTY    . CORONARY BALLOON ANGIOPLASTY N/A 06/29/2017   Procedure: CORONARY BALLOON ANGIOPLASTY;  Surgeon: Wellington Hampshire, MD;  Location: [place] CV LAB;  Service: Cardiovascular;  Laterality: N/A;  . CORONARY BALLOON ANGIOPLASTY N/A 09/20/2017   Procedure: CORONARY BALLOON ANGIOPLASTY;  Surgeon: Wellington Hampshire, MD;  Location: [place] CV LAB;  Service: Cardiovascular;  Laterality: N/A;  . CORONARY STENT INTERVENTION N/A 12/13/2019   Procedure: CORONARY STENT INTERVENTION;  Surgeon: Wellington Hampshire, MD;  Location: [place] CV LAB;  Service: Cardiovascular;  Laterality: N/A;  . DILATION AND CURETTAGE OF UTERUS    . ESOPHAGOGASTRODUODENOSCOPY (EGD) WITH PROPOFOL N/A 09/14/2014   Procedure: ESOPHAGOGASTRODUODENOSCOPY (EGD) WITH PROPOFOL;  Surgeon: Josefine Class, MD;  Location: [facility] ENDOSCOPY;  Service: Endoscopy;  Laterality: N/A;  . ESOPHAGOGASTRODUODENOSCOPY (EGD) WITH PROPOFOL N/A 04/27/2016   Procedure: ESOPHAGOGASTRODUODENOSCOPY (EGD) WITH PROPOFOL;  Surgeon: [name], MD;  Location: [place];  Service: Endoscopy;  Laterality: N/A;  Diabetic - oral meds  . ESOPHAGOGASTRODUODENOSCOPY (EGD) WITH PROPOFOL N/A 01/12/2018   Procedure: ESOPHAGOGASTRODUODENOSCOPY (EGD) WITH PROPOFOL;  Surgeon: Toledo, Benay Pike, MD;  Location: [facility];  Service: Endoscopy;  Laterality: N/A;  . ESOPHAGOGASTRODUODENOSCOPY (EGD) WITH PROPOFOL N/A 04/11/2019   Procedure: ESOPHAGOGASTRODUODENOSCOPY (EGD) WITH PROPOFOL;  Surgeon: [name], MD;  Location: [facility];  Service: Endoscopy;  Laterality: N/A;  . GASTRIC BYPASS  09/2009   [place] (IM) NAIL INTERTROCHANTERIC Left 10/13/2018   Procedure: INTRAMEDULLARY (IM) NAIL INTERTROCHANTRIC;  Surgeon: Leandrew Koyanagi, MD;  Location: [place];  Service: Orthopedics;  Laterality: Left;  . Left Carotid to sublcavian artery bypass w/ subclavian artery ligation     a. Performed @ [facility].  . LEFT HEART CATH AND CORONARY ANGIOGRAPHY Left 06/29/2017   Procedure: LEFT HEART CATH AND CORONARY ANGIOGRAPHY;   Surgeon: Wellington Hampshire, MD;  Location: [place] CV LAB;  Service: Cardiovascular;  Laterality: Left;  . LEFT HEART CATH AND CORONARY ANGIOGRAPHY N/A 09/20/2017   Procedure: LEFT HEART CATH AND CORONARY ANGIOGRAPHY;  Surgeon: Wellington Hampshire, MD;  Location: [place] CV LAB;  Service: Cardiovascular;  Laterality: N/A;  . LEFT HEART CATH AND CORONARY ANGIOGRAPHY N/A 12/20/2017   Procedure: LEFT HEART CATH AND CORONARY ANGIOGRAPHY;  Surgeon: Wellington Hampshire, MD;  Location: [place] CV LAB;  Service: Cardiovascular;  Laterality: N/A;  . LEFT HEART CATH AND CORONARY ANGIOGRAPHY N/A 04/20/2019   Procedure: LEFT HEART CATH AND CORONARY ANGIOGRAPHY possible PCI;  Surgeon: Minna Merritts, MD;  Location: [place] CV LAB;  Service: Cardiovascular;  Laterality: N/A;  . LEFT HEART CATH AND CORONARY ANGIOGRAPHY N/A 12/13/2019   Procedure: LEFT HEART CATH AND CORONARY ANGIOGRAPHY;  Surgeon: Wellington Hampshire, MD;  Location: [place] CV LAB;  Service: Cardiovascular;  Laterality: N/A;  . MELANOMA EXCISION  2016  Dr. Evorn Gong  . [place]  2002  . RIGHT OOPHORECTOMY    . SHOULDER ARTHROSCOPY WITH OPEN ROTATOR CUFF REPAIR Right 01/07/2016   Procedure: SHOULDER ARTHROSCOPY WITH DEBRIDMENT, SUBACHROMIAL DECOMPRESSION;  Surgeon: Corky Mull, MD;  Location: [facility];  Service: Orthopedics;  Laterality: Right;  . SHOULDER ARTHROSCOPY WITH OPEN ROTATOR CUFF REPAIR Right 03/16/2017   Procedure: SHOULDER ARTHROSCOPY WITH OPEN ROTATOR CUFF REPAIR POSSIBLE BICEPS TENODESIS;  Surgeon: Corky Mull, MD;  Location: [facility];  Service: Orthopedics;  Laterality: Right;  . TRIGGER FINGER RELEASE Right     Middle Finger      Allergies:   Lipitor [atorvastatin] and Tramadol   Social History   Tobacco Use  . Smoking status: Former Smoker    Packs/day: 0.25    Years: 1.00    Pack years: 0.25    Types: Cigarettes    Quit date: 08/31/1994    Years since quitting: 25.5  . Smokeless  tobacco: Never Used  . Tobacco comment: quit 28 years ago  Vaping Use  . Vaping Use: Never used  Substance Use Topics  . Alcohol use: No    Alcohol/week: 0.0 standard drinks  . Drug use: No     Current Outpatient Medications on File Prior to Visit  Medication Sig Dispense Refill  . ALPRAZolam (XANAX) 1 MG tablet Take 1 tablet by mouth twice daily as needed 60 tablet 5  . amLODipine (NORVASC) 2.5 MG tablet Take 1 tablet (2.5 mg total) by mouth daily. 90 tablet 3  . buPROPion (WELLBUTRIN SR) 200 MG 12 hr tablet Take 200 mg by mouth every morning.    . carvedilol (COREG) 25 MG tablet Take 25 mg by mouth 2 (two) times daily with a meal.    . desvenlafaxine (PRISTIQ) 50 MG 24 hr tablet Take 50 mg by mouth daily.    . diphenoxylate-atropine (LOMOTIL) 2.5-0.025 MG tablet Take 1 tablet by mouth 4 (four) times daily as needed for diarrhea or loose stools. 120 tablet 3  . EUTHYROX 25 MCG tablet TAKE 1 TABLET BY MOUTH ONCE DAILY BEFORE BREAKFAST 90 tablet 3  . famotidine (PEPCID) 20 MG tablet Take 20 mg by mouth 2 (two) times daily.    Marland Kitchen gabapentin (NEURONTIN) 300 MG capsule Take 2 capsules (600 mg total) by mouth 2 (two) times daily. 360 capsule 4  . isosorbide mononitrate (IMDUR) 120 MG 24 hr tablet Take 1 tablet (120 mg total) by mouth daily. 30 tablet 3  . nitroGLYCERIN (NITROSTAT) 0.4 MG SL tablet Place 1 tablet (0.4 mg total) under the tongue every 5 (five) minutes as needed for chest pain. 25 tablet 2  . pantoprazole (PROTONIX) 40 MG tablet Take 1 tablet by mouth twice daily 180 tablet 1  . Polyethyl Glycol-Propyl Glycol (SYSTANE OP) Place 1 drop into both eyes daily as needed (dry eyes).    . promethazine (PHENERGAN) 25 MG tablet TAKE 1 TABLET BY MOUTH EVERY 6 HOURS AS NEEDED FOR NAUSEA FOR VOMITING 30 tablet 1  . rosuvastatin (CRESTOR) 40 MG tablet Take 1 tablet (40 mg total) by mouth daily at 6 PM. 90 tablet 3  . traZODone (DESYREL) 100 MG tablet TAKE 1 TO 2 TABLETS BY MOUTH AT BEDTIME 30  tablet 5  . UBRELVY 100 MG TABS Take 100 mg by mouth 2 (two) times daily as needed (migraine).      No current facility-administered medications on file prior to visit.     Family Hx: The patient's family history includes  Anxiety disorder in her father and mother; Bipolar disorder in her mother; Breast cancer in an other family member; Breast cancer (age of onset: 13) in her maternal aunt and maternal aunt; Colon cancer in her cousin and father; Depression in her father, mother, and sister; Diabetes in her father and sister; Heart disease in her mother; Heart disease (age of onset: 7) in her father; Hyperlipidemia in her mother and sister; Hypertension in her father, mother, sister, and sister; Kidney cancer in her cousin; Kidney disease in her father and sister; Ovarian cancer in her cousin; Skin cancer in her father; Stroke in her father; Thyroid nodules in her sister. There is no history of Bladder Cancer.  ROS:   Please see the history of present illness.    Review of Systems  Constitutional: Negative.   HENT: Negative.   Respiratory: Positive for shortness of breath.   Cardiovascular: Positive for chest pain.  Gastrointestinal: Negative.   Musculoskeletal: Negative.   Neurological: Negative.   Psychiatric/Behavioral: Negative.   All other systems reviewed and are negative.   Labs/Other Tests and Data Reviewed:    Recent Labs: 10/18/2019: TSH 1.544 10/20/2019: Magnesium 2.1 01/09/2020: BNP 64.6 02/20/2020: ALT 19; BUN 23; Creatinine, Ser 1.94; Hemoglobin 10.6; Platelets 341; Potassium 5.8; Sodium 137   Recent Lipid Panel Lab Results  Component Value Date/Time   CHOL 94 11/06/2019 05:19 AM   CHOL 159 05/28/2016 12:12 PM   CHOL 197 12/30/2013 03:55 AM   TRIG 84 11/06/2019 05:19 AM   TRIG 261 (H) 12/30/2013 03:55 AM   HDL 29 (L) 11/06/2019 05:19 AM   HDL 34 (L) 05/28/2016 12:12 PM   HDL 28 (L) 12/30/2013 03:55 AM   CHOLHDL 3.2 11/06/2019 05:19 AM   LDLCALC 48 11/06/2019 05:19  AM   LDLCALC 103 (H) 12/08/2016 12:21 PM   LDLCALC 117 (H) 12/30/2013 03:55 AM   LDLDIRECT 83.3 01/26/2011 09:13 AM    Wt Readings from Last 3 Encounters:  02/26/20 153 lb (69.4 kg)  01/25/20 148 lb (67.1 kg)  01/09/20 158 lb (71.7 kg)     Exam:    Vital Signs: Vital signs may also be detailed in the HPI BP 132/86 (BP Location: Left Arm, Patient Position: Sitting, Cuff Size: Normal)   Pulse 86   Ht '5\' 3"'  (1.6 m)   Wt 153 lb (69.4 kg)   SpO2 95%   BMI 27.10 kg/m   Wt Readings from Last 3 Encounters:  02/26/20 153 lb (69.4 kg)  01/25/20 148 lb (67.1 kg)  01/09/20 158 lb (71.7 kg)   Temp Readings from Last 3 Encounters:  01/27/20 97.7 F (36.5 C) (Oral)  01/09/20 98.2 F (36.8 C) (Oral)  12/14/19 98.1 F (36.7 C) (Oral)   BP Readings from Last 3 Encounters:  02/26/20 132/86  01/27/20 (!) 155/84  01/09/20 130/80   Pulse Readings from Last 3 Encounters:  02/26/20 86  01/27/20 82  01/09/20 67     Constitutional:  oriented to person, place, and time. No distress.  HENT:  Head: Grossly normal Eyes:  no discharge. No scleral icterus.  Neck: No JVD, no carotid bruits  Cardiovascular: Regular rate and rhythm, no murmurs appreciated Pulmonary/Chest: Clear to auscultation bilaterally, no wheezes or rails Abdominal: Soft.  no distension.  no tenderness.  Musculoskeletal: Normal range of motion Neurological:  normal muscle tone. Coordination normal. No atrophy Skin: Skin warm and dry Psychiatric: normal affect, pleasant   ASSESSMENT & PLAN:    Problem List Items Addressed This Visit  Cardiology Problems   Essential hypertension   Relevant Medications   carvedilol (COREG) 25 MG tablet   CAD (coronary artery disease) - Primary   Relevant Medications   carvedilol (COREG) 25 MG tablet   Other Relevant Orders   EKG 12-Lead   Hyperlipidemia (Chronic)   Relevant Medications   carvedilol (COREG) 25 MG tablet   Chronic systolic CHF (congestive heart failure)  (HCC)   Relevant Medications   carvedilol (COREG) 25 MG tablet   Other Relevant Orders   EKG 12-Lead   Hypotension   Relevant Medications   carvedilol (COREG) 25 MG tablet   Ischemic cardiomyopathy   Relevant Medications   carvedilol (COREG) 25 MG tablet    Other Visit Diagnoses    Peripheral arterial disease (HCC)       Relevant Medications   carvedilol (COREG) 25 MG tablet     Coronary artery disease with stable angina Currently with no symptoms of angina. No further workup at this time. Continue current medication regimen.  History of stroke Continue aspirin Plavix BP well controlled  GI bleed: Has f/u with Dr. Allen Norris Still with diarrhea, on lomotil sometimes 4 at a time Concern for cardiac arrhythmia discussed, rare cases noted Suggested he discuss with Dr. Allen Norris  Weight loss: Stable, was having low sugars, Better now, drinks ensure  Anemia: HGB 10.6 one week ago Eating better Recent GI bleed, 10 units   Total encounter time more than 35 minutes  Greater than 50% was spent in counseling and coordination of care with the patient    Signed, Ida Rogue, [place] [address], [place], [place] [address]

## 2020-02-26 ENCOUNTER — Ambulatory Visit (INDEPENDENT_AMBULATORY_CARE_PROVIDER_SITE_OTHER): Payer: Medicare Other | Admitting: Cardiovascular Disease

## 2020-02-26 ENCOUNTER — Other Ambulatory Visit: Payer: Self-pay

## 2020-02-26 ENCOUNTER — Encounter: Payer: Self-pay | Admitting: Cardiovascular Disease

## 2020-02-26 VITALS — BP 132/86 | HR 86 | Ht 63.0 in | Wt 153.0 lb

## 2020-02-26 DIAGNOSIS — I10 Essential (primary) hypertension: Secondary | ICD-10-CM

## 2020-02-26 DIAGNOSIS — I25118 Atherosclerotic heart disease of native coronary artery with other forms of angina pectoris: Secondary | ICD-10-CM

## 2020-02-26 DIAGNOSIS — I5022 Chronic systolic (congestive) heart failure: Secondary | ICD-10-CM | POA: Diagnosis not present

## 2020-02-26 DIAGNOSIS — I255 Ischemic cardiomyopathy: Secondary | ICD-10-CM | POA: Diagnosis not present

## 2020-02-26 DIAGNOSIS — E785 Hyperlipidemia, unspecified: Secondary | ICD-10-CM | POA: Diagnosis not present

## 2020-02-26 DIAGNOSIS — I951 Orthostatic hypotension: Secondary | ICD-10-CM | POA: Diagnosis not present

## 2020-02-26 DIAGNOSIS — I739 Peripheral vascular disease, unspecified: Secondary | ICD-10-CM

## 2020-02-26 NOTE — Telephone Encounter (Signed)
Patient seen by Dr [name] in office today.

## 2020-02-26 NOTE — Patient Instructions (Signed)
Medication Instructions:  No changes  If you need a refill on your cardiac medications before your next appointment, please call your pharmacy.    Lab work: No new labs needed   If you have labs (blood work) drawn today and your tests are completely normal, you will receive your results only by: . MyChart Message (if you have MyChart) OR . A paper copy in the mail If you have any lab test that is abnormal or we need to change your treatment, we will call you to review the results.   Testing/Procedures: No new testing needed   Follow-Up: At [facility], you and your health needs are our priority.  As part of our continuing mission to provide you with exceptional heart care, we have created designated Provider Care Teams.  These Care Teams include your primary Cardiologist (physician) and Advanced Practice Providers (APPs -  Physician Assistants and Nurse Practitioners) who all work together to provide you with the care you need, when you need it.  . You will need a follow up appointment in 6 months  . Providers on your designated Care Team:   . Christopher Berge, NP . Ryan Dunn, PA-C . Jacquelyn Visser, PA-C  Any Other Special Instructions Will Be Listed Below (If Applicable).  COVID-19 Vaccine Information can be found at: [email or web address] For questions related to vaccine distribution or appointments, please email [email or web address] or call [phone].     

## 2020-02-27 ENCOUNTER — Encounter: Payer: Medicare Other | Admitting: *Deleted

## 2020-02-27 ENCOUNTER — Encounter: Payer: Self-pay | Admitting: Cardiovascular Disease

## 2020-02-27 ENCOUNTER — Telehealth: Payer: Self-pay | Admitting: Cardiovascular Disease

## 2020-02-27 ENCOUNTER — Other Ambulatory Visit: Payer: Self-pay | Admitting: *Deleted

## 2020-02-27 DIAGNOSIS — Z955 Presence of coronary angioplasty implant and graft: Secondary | ICD-10-CM

## 2020-02-27 MED ORDER — CARVEDILOL 25 MG PO TABS: 25.0000 mg | ORAL_TABLET | Freq: Two times a day (BID) | ORAL | 3 refills | Status: DC

## 2020-02-27 NOTE — Telephone Encounter (Signed)
STAT if HR is under 50 or over 120 (normal HR is 60-100 beats per minute)  1) What is your heart rate? 129   2) Do you have a log of your heart rate readings (document readings)? Per cardiac rehab patient had an episode of vtach lasting approx. 1 minute and stustaining stach in 120's baseline for patient was previously 60-70   3) Do you have any other symptoms? Palpitations

## 2020-02-27 NOTE — Telephone Encounter (Signed)
Spoke with Heath Lark RN over in [place]. She reports that during rehab patients heart rate was up to 120's and looked like torsades rhythm. She communicated with Dr. Fletcher Anon who advised she should come to the office to be seen. Reviewed that we did see her yesterday and she states that this is a change and would like her to come over to office. Requested that she please come over with rhythm strips and would have provider review. She verbalized understanding with no further questions at this time.

## 2020-02-27 NOTE — Telephone Encounter (Signed)
Dr. [name] reviewed forms brought over with patient regarding fast heart rates. He reviewed in detail and states that the fast rates are artifact and that if you look closely you can see this. He did mark on strip this information and requested that if in the future if there are any further arrhythmias noted to have patient lay down, check leads, and monitor for recovery of fast rates. Will fax this information back to cardiac rehab and will have these forms scanned into patients chart. Reviewed information with patient and instructed her to go pick up her medication then keep her next appointment with rehab and I would send them update as well. She was appreciative for the information with no further questions.

## 2020-02-27 NOTE — Progress Notes (Signed)
Incomplete Session Note  Patient Details  Name: [name] MRN: [id number] Date of Birth: [birth year] Referring Provider:   Flowsheet Row Cardiac Rehab from 01/04/2020 in [facility]  Referring Provider Kathlyn [place] MD      [name] did not complete her rehab session.  [name] came in with elevated HR  135  Then had a 30-60 second run of v tach.   No symptoms except feeling her heart racing.  She was taken via wheelchair to her cardiology office to be seen by provider in the office.

## 2020-02-28 ENCOUNTER — Encounter: Payer: Self-pay | Admitting: *Deleted

## 2020-02-28 DIAGNOSIS — Z955 Presence of coronary angioplasty implant and graft: Secondary | ICD-10-CM

## 2020-02-28 NOTE — Progress Notes (Signed)
Cardiac Individual Treatment Plan  Patient Details  Name: [name] MRN: [id number] Date of Birth: 27-Nov-1970 Referring Provider:   Flowsheet Row Cardiac Rehab from 01/04/2020 in [name] Cardiac and Pulmonary Rehab  Referring Provider Kathlyn Sacramento MD      Initial Encounter Date:  Flowsheet Row Cardiac Rehab from 01/04/2020 in [facility] Cardiac and Pulmonary Rehab  Date 01/04/20      Visit Diagnosis: Status post coronary artery stent placement  Patient's Home Medications on Admission:  Current Outpatient Medications:  .  ALPRAZolam (XANAX) 1 MG tablet, Take 1 tablet by mouth twice daily as needed, Disp: 60 tablet, Rfl: 5 .  amLODipine (NORVASC) 2.5 MG tablet, Take 1 tablet (2.5 mg total) by mouth daily., Disp: 90 tablet, Rfl: 3 .  buPROPion (WELLBUTRIN SR) 200 MG 12 hr tablet, Take 200 mg by mouth every morning., Disp: , Rfl:  .  carvedilol (COREG) 25 MG tablet, Take 1 tablet (25 mg total) by mouth 2 (two) times daily with a meal., Disp: 180 tablet, Rfl: 3 .  desvenlafaxine (PRISTIQ) 50 MG 24 hr tablet, Take 50 mg by mouth daily., Disp: , Rfl:  .  diphenoxylate-atropine (LOMOTIL) 2.5-0.025 MG tablet, Take 1 tablet by mouth 4 (four) times daily as needed for diarrhea or loose stools., Disp: 120 tablet, Rfl: 3 .  EUTHYROX 25 MCG tablet, TAKE 1 TABLET BY MOUTH ONCE DAILY BEFORE BREAKFAST, Disp: 90 tablet, Rfl: 3 .  famotidine (PEPCID) 20 MG tablet, Take 20 mg by mouth 2 (two) times daily., Disp: , Rfl:  .  gabapentin (NEURONTIN) 300 MG capsule, Take 2 capsules (600 mg total) by mouth 2 (two) times daily., Disp: 360 capsule, Rfl: 4 .  isosorbide mononitrate (IMDUR) 120 MG 24 hr tablet, Take 1 tablet (120 mg total) by mouth daily., Disp: 30 tablet, Rfl: 3 .  nitroGLYCERIN (NITROSTAT) 0.4 MG SL tablet, Place 1 tablet (0.4 mg total) under the tongue every 5 (five) minutes as needed for chest pain., Disp: 25 tablet, Rfl: 2 .  pantoprazole (PROTONIX) 40 MG tablet, Take 1 tablet by mouth  twice daily, Disp: 180 tablet, Rfl: 1 .  Polyethyl Glycol-Propyl Glycol (SYSTANE OP), Place 1 drop into both eyes daily as needed (dry eyes)., Disp: , Rfl:  .  promethazine (PHENERGAN) 25 MG tablet, TAKE 1 TABLET BY MOUTH EVERY 6 HOURS AS NEEDED FOR NAUSEA FOR VOMITING, Disp: 30 tablet, Rfl: 1 .  rosuvastatin (CRESTOR) 40 MG tablet, Take 1 tablet (40 mg total) by mouth daily at 6 PM., Disp: 90 tablet, Rfl: 3 .  traZODone (DESYREL) 100 MG tablet, TAKE 1 TO 2 TABLETS BY MOUTH AT BEDTIME, Disp: 30 tablet, Rfl: 5 .  UBRELVY 100 MG TABS, Take 100 mg by mouth 2 (two) times daily as needed (migraine). , Disp: , Rfl:   Past Medical History: Past Medical History:  Diagnosis Date  . Acute colitis 01/27/2017  . Acute pyelonephritis   . Anemia    iron deficiency anemia  . Aortic arch aneurysm ([place])   . BRCA negative 2014  . CAD (coronary artery disease)    a. 08/2003 Cath: LAD 30-40-med Rx; b. 11/2014 PCI: LAD 8m(3.25x23 Xience Alpine DES); c. 06/2015 PCI: D1 (2.25x12 Resolute Integrity DES); d. 06/2017 PCI: Patent mLAD stent, D2 95 (PTCA); e. 09/2017 PCI: D2 99ost (CBA); d. 12/2017 Cath: LM nl, LAD 31m80d (small), D1 40ost, D2 95ost, LCX 40p, RCA 40ost/p->Med rx for D2 given restenosis.  . Colitis 06/03/2015  . Colon polyp   .  CVA (cerebral vascular accident) ([place])    Left side weakness.   . Degenerative tear of glenoid labrum of right shoulder 03/15/2017  . Diabetes mellitus without complication ([place])   . Family history of breast cancer    BRCA neg 2014  . Femur fracture, left ([place]) 10/12/2018  . Gastric ulcer 04/27/2011  . History of echocardiogram    a. 03/2017 Echo: EF 60-65%, no rwma; b. 02/2018 Echo: EF 60-65%, no rwma. Nl RV fxn. No cardiac source of emboli (admitted w/ stroke).  . Hypertension   . Malignant melanoma of skin of scalp ([place])   . MI, acute, non ST segment elevation ([place])   . Neuromuscular disorder ([place])   . S/P drug eluting coronary stent placement 06/04/2015  . Sepsis ([place])  03/18/2017  . Stroke [facility])    a. 02/2018 MRI: 42m late acute/early subacute L medial frontal lobe inarct; b. 02/2018 MRA No large vessel occlusion or aneurysm. Mod to sev L P2 stenosis. thready L vertebral artery, diffusely dzs'd; c. 02/2018 Carotid U/S: <50% bilat ICA dzs.    Tobacco Use: Social History   Tobacco Use  Smoking Status Former Smoker  . Packs/day: 0.25  . Years: 1.00  . Pack years: 0.25  . Types: Cigarettes  . Quit date: 08/31/1994  . Years since quitting: 25.5  Smokeless Tobacco Never Used  Tobacco Comment   quit 28 years ago    Labs: Recent Review Flowsheet Data    Labs for ITP Cardiac and Pulmonary Rehab Latest Ref Rng & Units 01/28/2019 04/19/2019 09/03/2019 11/06/2019 01/25/2020   Cholestrol 0 - 200 mg/dL 106 125 - 94 -   LDLCALC 0 - 99 mg/dL 40 76 - 48 -   LDLDIRECT mg/dL - - - - -   HDL >40 mg/dL 56 34(L) - 29(L) -   Trlycerides <150 mg/dL 52 75 - 84 -   Hemoglobin A1c 4.8 - 5.6 % 5.6 5.1 4.8 5.2 5.0   PHART 7.350 - 7.450 - - - - -   PCO2ART 32.0 - 48.0 mmHg - - - - -   HCO3 20.0 - 28.0 mmol/L - - - - -   ACIDBASEDEF 0.0 - 2.0 mmol/L - - - - -   O2SAT % - - - - -       Exercise Target Goals: Exercise Program Goal: Individual exercise prescription set using results from initial 6 min walk test and THRR while considering  patient's activity barriers and safety.   Exercise Prescription Goal: Initial exercise prescription builds to 30-45 minutes a day of aerobic activity, 2-3 days per week.  Home exercise guidelines will be given to patient during program as part of exercise prescription that the participant will acknowledge.   Education: Aerobic Exercise: - Group verbal and visual presentation on the components of exercise prescription. Introduces F.I.T.T principle from ACSM for exercise prescriptions.  Reviews F.I.T.T. principles of aerobic exercise including progression. Written material given at graduation.   Education: Resistance Exercise: - Group  verbal and visual presentation on the components of exercise prescription. Introduces F.I.T.T principle from ACSM for exercise prescriptions  Reviews F.I.T.T. principles of resistance exercise including progression. Written material given at graduation.    Education: Exercise & Equipment Safety: - Individual verbal instruction and demonstration of equipment use and safety with use of the equipment. [facility]  Date 12/26/19  [facility] Instruction Review Code 1- Verbalizes Understanding      Education:  Exercise Physiology & General Exercise Guidelines: - Group verbal and written instruction with models to review the exercise physiology of the cardiovascular system and associated critical values. Provides general exercise guidelines with specific guidelines to those with heart or lung disease.    Education: Flexibility, Balance, Mind/Body Relaxation: - Group verbal and visual presentation with interactive activity on the components of exercise prescription. Introduces F.I.T.T principle from ACSM for exercise prescriptions. Reviews F.I.T.T. principles of flexibility and balance exercise training including progression. Also discusses the mind body connection.  Reviews various relaxation techniques to help reduce and manage stress (i.e. Deep breathing, progressive muscle relaxation, and visualization). Balance handout provided to take home. Written material given at graduation.   Activity Barriers & Risk Stratification:  Activity Barriers & Cardiac Risk Stratification - 01/04/20 1347      Activity Barriers & Cardiac Risk Stratification   Activity Barriers Shortness of Breath;Deconditioning;Muscular Weakness;Other (comment)    Comments Neuropathy, rod in left leg    Cardiac Risk Stratification High           6 Minute Walk:  6 Minute Walk    Row Name 01/04/20 1338         6 Minute Walk   Phase Initial      Distance 805 feet     Walk Time 6 minutes     # of Rest Breaks 0     MPH 1.52     METS 2.79     RPE 13     Perceived Dyspnea  1     VO2 Peak 9.77     Symptoms Yes (comment)     Comments Had anxiety     Resting HR 64 bpm     Resting BP 116/70     Resting Oxygen Saturation  99 %     Exercise Oxygen Saturation  during 6 min walk 96 %     Max Ex. HR 80 bpm     Max Ex. BP 126/68     2 Minute Post BP 110/68            Oxygen Initial Assessment:   Oxygen Re-Evaluation:   Oxygen Discharge (Final Oxygen Re-Evaluation):   Initial Exercise Prescription:  Initial Exercise Prescription - 01/04/20 1300      Date of Initial Exercise RX and Referring Provider   Date 01/04/20    Referring Provider Kathlyn Sacramento MD      Treadmill   MPH 1.3    Grade 0    Minutes 15    METs 2      NuStep   Level 1    SPM 80    Minutes 15    METs 2.7      REL-XR   Level 1    Speed 50    Minutes 15    METs 2.7      Biostep-RELP   Level 1    SPM 50    Minutes 15    METs 2.7      Prescription Details   Frequency (times per week) 2    Duration Progress to 30 minutes of continuous aerobic without signs/symptoms of physical distress      Intensity   THRR 40-80% of Max Heartrate 106-148    Ratings of Perceived Exertion 11-13    Perceived Dyspnea 0-4      Progression   Progression Continue to progress workloads to maintain intensity without signs/symptoms of physical distress.      Horticulturist, commercial Prescription  Yes    Weight 3 lb    Reps 10-15           Perform Capillary Blood Glucose checks as needed.  Exercise Prescription Changes:  Exercise Prescription Changes    Row Name 01/04/20 1300 01/22/20 1600           Response to Exercise   Blood Pressure (Admit) 116/70 118/78      Blood Pressure (Exercise) 126/68 120/60      Blood Pressure (Exit) 110/68 102/62      Heart Rate (Admit) 64 bpm 75 bpm      Heart Rate (Exercise) 80 bpm 83 bpm      Heart Rate  (Exit) 65 bpm 64 bpm      Oxygen Saturation (Admit) 99 % --      Oxygen Saturation (Exercise) 95 % --      Oxygen Saturation (Exit) 99 % --      Rating of Perceived Exertion (Exercise) 13 10      Perceived Dyspnea (Exercise) 1 --      Symptoms anxiety nervous      Comments walk test results first full day of exercise      Duration -- Progress to 30 minutes of  aerobic without signs/symptoms of physical distress      Intensity -- THRR unchanged             Progression   Progression -- Continue to progress workloads to maintain intensity without signs/symptoms of physical distress.      Average METs -- 1.1             Resistance Training   Training Prescription -- Yes      Weight -- 3 lb      Reps -- 10-15             Interval Training   Interval Training -- No             NuStep   Level -- 1      Minutes -- 30      METs -- 1.1             Exercise Comments:  Exercise Comments    Row Name 01/11/20 0750 02/27/20 0842         Exercise Comments First full day of exercise!  Patient was oriented to gym and equipment including functions, settings, policies, and procedures.  Patient's individual exercise prescription and treatment plan were reviewed.  All starting workloads were established based on the results of the 6 minute walk test done at initial orientation visit.  The plan for exercise progression was also introduced and progression will be customized based on patient's performance and goals. [place] did not complete her rehab session.  Keyanna came in with elevated HR  135  Then had a 30-60 second run of v tach.   No symptoms except feeling her heart racing.  She was taken via wheelchair to her cardiology office to be seen by provider in the office.             Exercise Goals and Review:  Exercise Goals    Row Name 01/04/20 1345             Exercise Goals   Increase Physical Activity Yes       Intervention Provide advice, education, support and  counseling about physical activity/exercise needs.;Develop an individualized exercise prescription for aerobic and resistive training based on initial evaluation findings, risk stratification, comorbidities  and participant's personal goals.       Expected Outcomes Short Term: Attend rehab on a regular basis to increase amount of physical activity.;Long Term: Add in home exercise to make exercise part of routine and to increase amount of physical activity.;Long Term: Exercising regularly at least 3-5 days a week.       Increase Strength and Stamina Yes       Intervention Provide advice, education, support and counseling about physical activity/exercise needs.;Develop an individualized exercise prescription for aerobic and resistive training based on initial evaluation findings, risk stratification, comorbidities and participant's personal goals.       Expected Outcomes Short Term: Increase workloads from initial exercise prescription for resistance, speed, and METs.;Short Term: Perform resistance training exercises routinely during rehab and add in resistance training at home;Long Term: Improve cardiorespiratory fitness, muscular endurance and strength as measured by increased METs and functional capacity (6MWT)       Able to understand and use rate of perceived exertion (RPE) scale Yes       Intervention Provide education and explanation on how to use RPE scale       Expected Outcomes Short Term: Able to use RPE daily in rehab to express subjective intensity level;Long Term:  Able to use RPE to guide intensity level when exercising independently       Able to understand and use Dyspnea scale Yes       Intervention Provide education and explanation on how to use Dyspnea scale       Expected Outcomes Short Term: Able to use Dyspnea scale daily in rehab to express subjective sense of shortness of breath during exertion;Long Term: Able to use Dyspnea scale to guide intensity level when exercising independently        Knowledge and understanding of Target Heart Rate Range (THRR) Yes       Intervention Provide education and explanation of THRR including how the numbers were predicted and where they are located for reference       Expected Outcomes Short Term: Able to state/look up THRR;Short Term: Able to use daily as guideline for intensity in rehab;Long Term: Able to use THRR to govern intensity when exercising independently       Able to check pulse independently Yes       Intervention Provide education and demonstration on how to check pulse in carotid and radial arteries.;Review the importance of being able to check your own pulse for safety during independent exercise       Expected Outcomes Short Term: Able to explain why pulse checking is important during independent exercise;Long Term: Able to check pulse independently and accurately       Understanding of Exercise Prescription Yes       Intervention Provide education, explanation, and written materials on patient's individual exercise prescription       Expected Outcomes Short Term: Able to explain program exercise prescription;Long Term: Able to explain home exercise prescription to exercise independently       Improve claudication pain toleration; Improve walking ability Yes              Exercise Goals Re-Evaluation :  Exercise Goals Re-Evaluation    Row Name 01/11/20 0751 01/22/20 1637 02/19/20 1047         Exercise Goal Re-Evaluation   Exercise Goals Review Increase Physical Activity;Able to understand and use rate of perceived exertion (RPE) scale;Knowledge and understanding of Target Heart Rate Range (THRR);Understanding of Exercise Prescription;Increase Strength and Stamina;Able to understand  and use Dyspnea scale;Able to check pulse independently Increase Physical Activity;Increase Strength and Stamina;Understanding of Exercise Prescription --     Comments Reviewed RPE and dyspnea scales, THR and program prescription with pt today.   Pt voiced understanding and was given a copy of goals to take home. Charlea has completed her first full day of exercise.  We will continue to montior her progress. Out since last review     Expected Outcomes Short: Use RPE daily to regulate intensity. Long: Follow program prescription in THR. Short: Attend rehab regularly Long: Continue to follow program prescription --            Discharge Exercise Prescription (Final Exercise Prescription Changes):  Exercise Prescription Changes - 01/22/20 1600      Response to Exercise   Blood Pressure (Admit) 118/78    Blood Pressure (Exercise) 120/60    Blood Pressure (Exit) 102/62    Heart Rate (Admit) 75 bpm    Heart Rate (Exercise) 83 bpm    Heart Rate (Exit) 64 bpm    Rating of Perceived Exertion (Exercise) 10    Symptoms nervous    Comments first full day of exercise    Duration Progress to 30 minutes of  aerobic without signs/symptoms of physical distress    Intensity THRR unchanged      Progression   Progression Continue to progress workloads to maintain intensity without signs/symptoms of physical distress.    Average METs 1.1      Resistance Training   Training Prescription Yes    Weight 3 lb    Reps 10-15      Interval Training   Interval Training No      NuStep   Level 1    Minutes 30    METs 1.1           Nutrition:  Target Goals: Understanding of nutrition guidelines, daily intake of sodium <1513m, cholesterol <2042m calories 30% from fat and 7% or less from saturated fats, daily to have 5 or more servings of fruits and vegetables.  Education: All About Nutrition: -Group instruction provided by verbal, written material, interactive activities, discussions, models, and posters to present general guidelines for heart healthy nutrition including fat, fiber, MyPlate, the role of sodium in heart healthy nutrition, utilization of the nutrition label, and utilization of this knowledge for meal planning. Follow up email sent  as well. Written material given at graduation.   Biometrics:  Pre Biometrics - 01/04/20 1347      Pre Biometrics   Height _0  (1.6 m)    Weight 153 lb 1.6 oz (69.4 kg)    BMI (Calculated) 27.13    Single Leg Stand 8.2 seconds            Nutrition Therapy Plan and Nutrition Goals:  Nutrition Therapy & Goals - 01/08/20 1609      Personal Nutrition Goals   Comments Picture Your Place: 67107 A PYP score greater than 60 likely indicates a healthful dietary pattern, although there may be some specific eating behaviors that could be improved. [place] has 2-3 servings of vegetables per day (green beans written in), 0-1 servings of fruit per day; 2+ servings of dark green vegeabltes, 0-3 servings of red and orange vegetables, 0-10 servings of starchy vegetables per week. She has 1 serving of whole grain breads per day and 1-2 servings of other whole grains per week. She rarely has red meat. She will eat chicken and has fish 1x/week. She has 1-2  servings of beans and 0-1 servings or nuts/nut butter per week. She has either 0 or 4+ servings of low fat dairy and eats natural cheese. She uses soft margarine, does not fry food, uses spray olive oil with baking, and herbs/spices on vegetables. She rarely gets low sodium or no salt added canned items, she will use seasoning packets or sauce 1x/week and canned soups, bouillon, and bottled dressings 1x/week. She does not salt her foods. She will have snacks like chips, crackers, pretzels, popcorn 2-3x/week. She has regular soda 2+x/day and does not drink alcohol.           Nutrition Assessments:  MEDIFICTS Score Key:  ?70 Need to make dietary changes   40-70 Heart Healthy Diet  ? 40 Therapeutic Level Cholesterol Diet  Flowsheet Row Cardiac Rehab from 01/04/2020 in [facility] Cardiac and Pulmonary Rehab  Picture Your Plate Total Score on Admission 67     Picture Your Plate Scores:  <44 Unhealthy dietary pattern with much room for improvement.  41-50  Dietary pattern unlikely to meet recommendations for good health and room for improvement.  51-60 More healthful dietary pattern, with some room for improvement.   >60 Healthy dietary pattern, although there may be some specific behaviors that could be improved.    Nutrition Goals Re-Evaluation:   Nutrition Goals Discharge (Final Nutrition Goals Re-Evaluation):   Psychosocial: Target Goals: Acknowledge presence or absence of significant depression and/or stress, maximize coping skills, provide positive support system. Participant is able to verbalize types and ability to use techniques and skills needed for reducing stress and depression.   Education: Stress, Anxiety, and Depression - Group verbal and visual presentation to define topics covered.  Reviews how body is impacted by stress, anxiety, and depression.  Also discusses healthy ways to reduce stress and to treat/manage anxiety and depression.  Written material given at graduation. Flowsheet Row Cardiac Rehab from 01/25/2020 in [facility] Cardiac and Pulmonary Rehab  Date 01/11/20  Educator SB  Instruction Review Code 1- [employer] Understanding      Education: Sleep Hygiene -Provides group verbal and written instruction about how sleep can affect your health.  Define sleep hygiene, discuss sleep cycles and impact of sleep habits. Review good sleep hygiene tips.    Initial Review & Psychosocial Screening:  Initial Psych Review & Screening - 12/26/19 0843      Initial Review   Current issues with Current Psychotropic Meds;Current Anxiety/Panic;History of Depression;Current Depression;Current Stress Concerns;Current Sleep Concerns    Source of Stress Concerns Chronic Illness;Unable to perform yard/household activities    Comments Her son is a Airline 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 She gets of out breath alot and has chest pains sometimes and needs to lay down. She has chest pain gets depressed and is scared to go out by  herself.      Family Dynamics   Good Support System? Yes    Comments She can look to her son and her church family. Yazmina states that she has no one else to look to for support.      Barriers   Psychosocial barriers to participate in program The patient should benefit from training in stress management and relaxation.      Screening Interventions   Interventions To provide support and resources with identified psychosocial needs;Encouraged to exercise;Provide feedback about the scores to participant    Expected Outcomes Short Term goal: Utilizing psychosocial counselor, staff and physician to assist with identification of specific Stressors or current issues interfering with  healing process. Setting desired goal for each stressor or current issue identified.;Long Term Goal: Stressors or current issues are controlled or eliminated.;Short Term goal: Identification and review with participant of any Quality of Life or Depression concerns found by scoring the questionnaire.;Long Term goal: The participant improves quality of Life and PHQ9 Scores as seen by post scores and/or verbalization of changes           Quality of Life Scores:   Quality of Life - 01/04/20 1351      Quality of Life   Select Quality of Life      Quality of Life Scores   Health/Function Pre 5.6 %    Socioeconomic Pre 18.86 %    Psych/Spiritual Pre 5.14 %    Family Pre 15.6 %    GLOBAL Pre 9.71 %          Scores of 19 and below usually indicate a poorer quality of life in these areas.  A difference of  2-3 points is a clinically meaningful difference.  A difference of 2-3 points in the total score of the Quality of Life Index has been associated with significant improvement in overall quality of life, self-image, physical symptoms, and general health in studies assessing change in quality of life.  PHQ-9: Recent Review Flowsheet Data    Depression screen [facility] 2/9 01/04/2020 08/22/2019 08/22/2019 12/09/2018   Decreased  Interest _0 Down, Depressed, Hopeless _1 PHQ - 2 Score _2 Altered sleeping _3 Tired, decreased energy _4 Change in appetite _5 Feeling bad or failure about yourself  _6 Trouble concentrating _7 Moving slowly or fidgety/restless 3 2 0 3   Suicidal thoughts 2  0 0 1   PHQ-9 Score _8 Difficult doing work/chores Very difficult Very difficult Very difficult Extremely dIfficult     Interpretation of Total Score  Total Score Depression Severity:  1-4 = Minimal depression, 5-9 = Mild depression, 10-14 = Moderate depression, 15-19 = Moderately severe depression, 20-27 = Severe depression   Psychosocial Evaluation and Intervention:  Psychosocial Evaluation - 12/26/19 0846      Psychosocial Evaluation & Interventions   Interventions Encouraged to exercise with the program and follow exercise prescription;Relaxation education;Stress management education    Comments Her son is a Airline 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 She gets of out breath alot and has chest pains sometimes and needs to lay down.    Expected Outcomes Short: Exercise regularly to support mental health and notify staff of any changes. Long: maintain mental health and well being through teaching of rehab or prescribed medications independently.    Continue Psychosocial Services  Follow up required by staff           Psychosocial Re-Evaluation:   Psychosocial Discharge (Final Psychosocial Re-Evaluation):   Vocational Rehabilitation: Provide vocational rehab assistance to qualifying candidates.   Vocational Rehab Evaluation & Intervention:   Education: Education Goals: Education classes will be provided on a variety of topics geared toward better understanding of heart health and risk factor modification. Participant will state understanding/return demonstration of topics presented as noted by education test scores.  Learning  Barriers/Preferences:  Learning Barriers/Preferences - 12/26/19 4010      Learning Barriers/Preferences   Learning Barriers None  Learning Preferences None           General Cardiac Education Topics:  AED/CPR: - Group verbal and written instruction with the use of models to demonstrate the basic use of the AED with the basic ABC's of resuscitation.   Anatomy and Cardiac Procedures: - Group verbal and visual presentation and models provide information about basic cardiac anatomy and function. Reviews the testing methods done to diagnose heart disease and the outcomes of the test results. Describes the treatment choices: Medical Management, Angioplasty, or Coronary Bypass Surgery for treating various heart conditions including Myocardial Infarction, Angina, Valve Disease, and Cardiac Arrhythmias.  Written material given at graduation.   Medication Safety: - Group verbal and visual instruction to review commonly prescribed medications for heart and lung disease. Reviews the medication, class of the drug, and side effects. Includes the steps to properly store meds and maintain the prescription regimen.  Written material given at graduation.   Intimacy: - Group verbal instruction through game format to discuss how heart and lung disease can affect sexual intimacy. Written material given at graduation..   Know Your Numbers and Heart Failure: - Group verbal and visual instruction to discuss disease risk factors for cardiac and pulmonary disease and treatment options.  Reviews associated critical values for Overweight/Obesity, Hypertension, Cholesterol, and Diabetes.  Discusses basics of heart failure: signs/symptoms and treatments.  Introduces Heart Failure Zone chart for action plan for heart failure.  Written material given at graduation.   Infection Prevention: - Provides verbal and written material to individual with discussion of infection control including proper hand washing and  proper equipment cleaning during exercise session. Flowsheet Row Cardiac Rehab from 01/25/2020 in [facility] Cardiac [facility] Pul[facility] Rehab  Date 12/26/19  Educator [facility]  Instruction Review Code 1- Verbalizes Understanding      Falls Prevention: - Provides verbal and written material to individual with discussion of falls prevention and safety. Flowsheet Row Cardiac Rehab from 01/25/2020 in [facility] Cardiac and Pulmonary Rehab  Date 12/26/19  Educator [facility]  Instruction Review Code 1- Verbalizes Understanding      Other: -Provides group and verbal instruction on various topics (see comments)   Knowledge Questionnaire Score:  Knowledge Questionnaire Score - 01/04/20 1352      Knowledge Questionnaire Score   Pre Score 19/26: A&P, Angina, PAD, Exercise, Tobacco           Core Components/Risk Factors/Patient Goals at Admission:  Personal Goals and Risk Factors at Admission - 01/04/20 1348      Core Components/Risk Factors/Patient Goals on Admission    Weight Management Yes;Weight Loss    Intervention Weight Management: Develop a combined nutrition and exercise program designed to reach desired caloric intake, while maintaining appropriate intake of nutrient and fiber, sodium and fats, and appropriate energy expenditure required for the weight goal.;Weight Management: Provide education and appropriate resources to help participant work on and attain dietary goals.;Weight Management/Obesity: Establish reasonable short term and long term weight goals.    Admit Weight 153 lb 1.6 oz (69.4 kg)    Goal Weight: Short Term 150 lb (68 kg)    Goal Weight: Long Term 147 lb (66.7 kg)    Expected Outcomes Short Term: Continue to assess and modify interventions until short term weight is achieved;Long Term: Adherence to nutrition and physical activity/exercise program aimed toward attainment of established weight goal;Understanding recommendations for meals to include 15-35% energy as protein, 25-35% energy from  fat, 35-60% energy from carbohydrates, less than 244m of dietary cholesterol, 20-35  gm of total fiber daily;Understanding of distribution of calorie intake throughout the day with the consumption of 4-5 meals/snacks;Weight Loss: Understanding of general recommendations for a balanced deficit meal plan, which promotes 1-2 lb weight loss per week and includes a negative energy balance of [phone] kcal/d    Diabetes Yes    Intervention Provide education about signs/symptoms and action to take for hypo/hyperglycemia.;Provide education about proper nutrition, including hydration, and aerobic/resistive exercise prescription along with prescribed medications to achieve blood glucose in normal ranges: Fasting glucose 65-99 mg/dL    Expected Outcomes Short Term: Participant verbalizes understanding of the signs/symptoms and immediate care of hyper/hypoglycemia, proper foot care and importance of medication, aerobic/resistive exercise and nutrition plan for blood glucose control.;Long Term: Attainment of HbA1C < 7%.    Heart Failure Yes    Intervention Provide a combined exercise and nutrition program that is supplemented with education, support and counseling about heart failure. Directed toward relieving symptoms such as shortness of breath, decreased exercise tolerance, and extremity edema.    Expected Outcomes Improve functional capacity of life;Short term: Attendance in program 2-3 days a week with increased exercise capacity. Reported lower sodium intake. Reported increased fruit and vegetable intake. Reports medication compliance.;Short term: Daily weights obtained and reported for increase. Utilizing diuretic protocols set by physician.;Long term: Adoption of self-care skills and reduction of barriers for early signs and symptoms recognition and intervention leading to self-care maintenance.    Hypertension Yes    Intervention Provide education on lifestyle modifcations including regular physical  activity/exercise, weight management, moderate sodium restriction and increased consumption of fresh fruit, vegetables, and low fat dairy, alcohol moderation, and smoking cessation.;Monitor prescription use compliance.    Expected Outcomes Short Term: Continued assessment and intervention until BP is < 140/60m HG in hypertensive participants. < 130/84mHG in hypertensive participants with diabetes, heart failure or chronic kidney disease.;Long Term: Maintenance of blood pressure at goal levels.    Lipids Yes    Intervention Provide education and support for participant on nutrition & aerobic/resistive exercise along with prescribed medications to achieve LDL <7022mHDL >67m110m  Expected Outcomes Short Term: Participant states understanding of desired cholesterol values and is compliant with medications prescribed. Participant is following exercise prescription and nutrition guidelines.;Long Term: Cholesterol controlled with medications as prescribed, with individualized exercise RX and with personalized nutrition plan. Value goals: LDL < 70mg89mL > 40 mg.    Stress Yes    Intervention Offer individual and/or small group education and counseling on adjustment to heart disease, stress management and health-related lifestyle change. Teach and support self-help strategies.;Refer participants experiencing significant psychosocial distress to appropriate mental health specialists for further evaluation and treatment. When possible, include family members and significant others in education/counseling sessions.    Expected Outcomes Short Term: Participant demonstrates changes in health-related behavior, relaxation and other stress management skills, ability to obtain effective social support, and compliance with psychotropic medications if prescribed.;Long Term: Emotional wellbeing is indicated by absence of clinically significant psychosocial distress or social isolation.           Education:Diabetes -  Individual verbal and written instruction to review signs/symptoms of diabetes, desired ranges of glucose level fasting, after meals and with exercise. Acknowledge that pre and post exercise glucose checks will be done for 3 sessions at entry of program. [facility]  Date 12/26/19  Educator [facility] Review Code 1- Verbalizes Understanding  Core Components/Risk Factors/Patient Goals Review:    Core Components/Risk Factors/Patient Goals at Discharge (Final Review):    ITP Comments:  ITP Comments    Row Name 12/26/19 0842 01/04/20 1201 01/11/20 0750 01/29/20 1130 01/31/20 0726   ITP Comments Virtual Visit completed. Patient informed on EP and RD appointment and 6 Minute walk test. Patient also informed of patient health questionnaires on My Chart. Patient Verbalizes understanding. Visit diagnosis can be found in [facility] 12/13/2019. Completed 6MWT and gym orientation. Initial ITP created and sent for review to Dr. Emily Filbert, Medical Director. First full day of exercise!  Patient was oriented to gym and equipment including functions, settings, policies, and procedures.  Patient's individual exercise prescription and treatment plan were reviewed.  All starting workloads were established based on the results of the 6 minute walk test done at initial orientation visit.  The plan for exercise progression was also introduced and progression will be customized based on patient's performance and goals. Alma called to inform staff she was still in the hospital at [facility] for a GI bleed. They are running tests and she said she expects to be there for 10 days. Staff will follow up in two weeks to see how she is feeling. 30 Day review completed. Medical Director ITP review done, changes made as directed, and signed approval by Medical Director.   [place] Name 02/05/20 1137 02/27/20 0842 02/28/20 0955       ITP Comments Jalyric has been in the  hospital for other health concerns and hasn't attended since last review. [place] did not complete her rehab session.  Shaney came in with elevated HR  135  Then had a 30-60 second run of v tach.   No symptoms except feeling her heart racing.  She was taken via wheelchair to her cardiology office to be seen by provider in the office. 30 Day review completed. Medical Director ITP review done, changes made as directed, and signed approval by Medical Director.  Cardiologist looked at rhythm strips yesterday and determined it was all artifact. She was instructed to fill her carvedilol RX and start taking.            Comments:

## 2020-02-29 ENCOUNTER — Other Ambulatory Visit: Payer: Self-pay

## 2020-02-29 DIAGNOSIS — Z955 Presence of coronary angioplasty implant and graft: Secondary | ICD-10-CM | POA: Diagnosis not present

## 2020-02-29 NOTE — Progress Notes (Signed)
Daily Session Note  Patient Details  Name: [name] MRN: [id number] Date of Birth: [birth year] Referring Provider:   Flowsheet Row Cardiac Rehab from 01/04/2020 in [facility] Cardiac and Pulmonary Rehab  Referring Provider Kathlyn Sacramento MD      Encounter Date: 02/29/2020  Check In:  Session Check In - 02/29/20 0743      Check-In   Supervising physician immediately available to respond to emergencies See telemetry face sheet for immediately available ER MD    Location [facility]-Cardiac & Pulmonary Rehab    Staff Present [name], MPA, RN;Laureen Owens Shark, BS, RRT, CPFT;Kara Eliezer Bottom, MS Exercise Physiologist    Virtual Visit No    Medication changes reported     No    Fall or balance concerns reported    No    Warm-up and Cool-down Performed on first and last piece of equipment    Resistance Training Performed Yes    VAD Patient? No    PAD/SET Patient? No      Pain Assessment   Currently in Pain? No/denies              Social History   Tobacco Use  Smoking Status Former Smoker  . Packs/day: 0.25  . Years: 1.00  . Pack years: 0.25  . Types: Cigarettes  . Quit date: 08/31/1994  . Years since quitting: 25.5  Smokeless Tobacco Never Used  Tobacco Comment   quit 28 years ago    Goals Met:  Independence with exercise equipment Exercise tolerated well No report of cardiac concerns or symptoms Strength training completed today  Goals Unmet:  Not Applicable  Comments: Pt able to follow exercise prescription today without complaint.  Will continue to monitor for progression.    Dr. Emily Filbert is Medical Director for [place] [facility].

## 2020-03-01 ENCOUNTER — Encounter: Payer: Self-pay | Admitting: Family Medicine

## 2020-03-01 DIAGNOSIS — I639 Cerebral infarction, unspecified: Secondary | ICD-10-CM | POA: Diagnosis not present

## 2020-03-01 DIAGNOSIS — R2689 Other abnormalities of gait and mobility: Secondary | ICD-10-CM | POA: Diagnosis not present

## 2020-03-05 ENCOUNTER — Encounter: Payer: Medicare Other | Attending: Cardiovascular Disease

## 2020-03-05 DIAGNOSIS — Z955 Presence of coronary angioplasty implant and graft: Secondary | ICD-10-CM | POA: Insufficient documentation

## 2020-03-05 NOTE — Telephone Encounter (Signed)
Error

## 2020-03-07 ENCOUNTER — Encounter: Payer: Self-pay | Admitting: *Deleted

## 2020-03-07 ENCOUNTER — Inpatient Hospital Stay
Admission: EM | Admit: 2020-03-07 | Discharge: 2020-03-09 | DRG: 378 | Disposition: A | Discharge status: Home / Self Care | Payer: Medicare Other | Attending: Hospitalist | Admitting: Hospitalist

## 2020-03-07 ENCOUNTER — Other Ambulatory Visit: Payer: Self-pay

## 2020-03-07 DIAGNOSIS — Z833 Family history of diabetes mellitus: Secondary | ICD-10-CM | POA: Diagnosis not present

## 2020-03-07 DIAGNOSIS — K284 Chronic or unspecified gastrojejunal ulcer with hemorrhage: Secondary | ICD-10-CM | POA: Diagnosis present

## 2020-03-07 DIAGNOSIS — Z955 Presence of coronary angioplasty implant and graft: Secondary | ICD-10-CM | POA: Diagnosis not present

## 2020-03-07 DIAGNOSIS — Z85820 Personal history of malignant melanoma of skin: Secondary | ICD-10-CM

## 2020-03-07 DIAGNOSIS — Z87891 Personal history of nicotine dependence: Secondary | ICD-10-CM

## 2020-03-07 DIAGNOSIS — E1122 Type 2 diabetes mellitus with diabetic chronic kidney disease: Secondary | ICD-10-CM | POA: Diagnosis present

## 2020-03-07 DIAGNOSIS — I130 Hypertensive heart and chronic kidney disease with heart failure and stage 1 through stage 4 chronic kidney disease, or unspecified chronic kidney disease: Secondary | ICD-10-CM | POA: Diagnosis present

## 2020-03-07 DIAGNOSIS — Z7982 Long term (current) use of aspirin: Secondary | ICD-10-CM

## 2020-03-07 DIAGNOSIS — E785 Hyperlipidemia, unspecified: Secondary | ICD-10-CM | POA: Diagnosis present

## 2020-03-07 DIAGNOSIS — I2510 Atherosclerotic heart disease of native coronary artery without angina pectoris: Secondary | ICD-10-CM | POA: Diagnosis present

## 2020-03-07 DIAGNOSIS — Z9884 Bariatric surgery status: Secondary | ICD-10-CM | POA: Diagnosis not present

## 2020-03-07 DIAGNOSIS — Z8249 Family history of ischemic heart disease and other diseases of the circulatory system: Secondary | ICD-10-CM

## 2020-03-07 DIAGNOSIS — I5022 Chronic systolic (congestive) heart failure: Secondary | ICD-10-CM | POA: Diagnosis not present

## 2020-03-07 DIAGNOSIS — I252 Old myocardial infarction: Secondary | ICD-10-CM

## 2020-03-07 DIAGNOSIS — Z20822 Contact with and (suspected) exposure to covid-19: Secondary | ICD-10-CM | POA: Diagnosis present

## 2020-03-07 DIAGNOSIS — Z823 Family history of stroke: Secondary | ICD-10-CM | POA: Diagnosis not present

## 2020-03-07 DIAGNOSIS — I10 Essential (primary) hypertension: Secondary | ICD-10-CM | POA: Diagnosis not present

## 2020-03-07 DIAGNOSIS — Z83438 Family history of other disorder of lipoprotein metabolism and other lipidemia: Secondary | ICD-10-CM

## 2020-03-07 DIAGNOSIS — Z79899 Other long term (current) drug therapy: Secondary | ICD-10-CM

## 2020-03-07 DIAGNOSIS — Z888 Allergy status to other drugs, medicaments and biological substances status: Secondary | ICD-10-CM | POA: Diagnosis not present

## 2020-03-07 DIAGNOSIS — Z7902 Long term (current) use of antithrombotics/antiplatelets: Secondary | ICD-10-CM

## 2020-03-07 DIAGNOSIS — N1832 Chronic kidney disease, stage 3b: Secondary | ICD-10-CM | POA: Diagnosis present

## 2020-03-07 DIAGNOSIS — K922 Gastrointestinal hemorrhage, unspecified: Secondary | ICD-10-CM | POA: Insufficient documentation

## 2020-03-07 DIAGNOSIS — Z841 Family history of disorders of kidney and ureter: Secondary | ICD-10-CM

## 2020-03-07 DIAGNOSIS — N179 Acute kidney failure, unspecified: Secondary | ICD-10-CM | POA: Diagnosis present

## 2020-03-07 DIAGNOSIS — I5042 Chronic combined systolic (congestive) and diastolic (congestive) heart failure: Secondary | ICD-10-CM | POA: Diagnosis present

## 2020-03-07 DIAGNOSIS — Z8673 Personal history of transient ischemic attack (TIA), and cerebral infarction without residual deficits: Secondary | ICD-10-CM | POA: Diagnosis not present

## 2020-03-07 DIAGNOSIS — D649 Anemia, unspecified: Secondary | ICD-10-CM | POA: Diagnosis not present

## 2020-03-07 DIAGNOSIS — K921 Melena: Secondary | ICD-10-CM | POA: Diagnosis present

## 2020-03-07 DIAGNOSIS — K219 Gastro-esophageal reflux disease without esophagitis: Secondary | ICD-10-CM | POA: Diagnosis present

## 2020-03-07 DIAGNOSIS — D500 Iron deficiency anemia secondary to blood loss (chronic): Secondary | ICD-10-CM | POA: Diagnosis present

## 2020-03-07 DIAGNOSIS — K253 Acute gastric ulcer without hemorrhage or perforation: Secondary | ICD-10-CM | POA: Diagnosis not present

## 2020-03-07 LAB — URINALYSIS, COMPLETE (UACMP) WITH MICROSCOPIC
Bilirubin Urine: NEGATIVE
Glucose, UA: NEGATIVE mg/dL
Ketones, ur: NEGATIVE mg/dL
Nitrite: POSITIVE — AB
Protein, ur: NEGATIVE mg/dL
Specific Gravity, Urine: 1.015 (ref 1.005–1.030)
pH: 5 (ref 5.0–8.0)

## 2020-03-07 LAB — IRON AND TIBC
Iron: 26 ug/dL — ABNORMAL LOW (ref 28–170)
Saturation Ratios: 11 % (ref 10.4–31.8)
TIBC: 245 ug/dL — ABNORMAL LOW (ref 250–450)
UIBC: 219 ug/dL

## 2020-03-07 LAB — COMPREHENSIVE METABOLIC PANEL
ALT: 17 U/L (ref 0–44)
AST: 23 U/L (ref 15–41)
Albumin: 3 g/dL — ABNORMAL LOW (ref 3.5–5.0)
Alkaline Phosphatase: 88 U/L (ref 38–126)
Anion gap: 9 (ref 5–15)
BUN: 42 mg/dL — ABNORMAL HIGH (ref 6–20)
CO2: 22 mmol/L (ref 22–32)
Calcium: 8.5 mg/dL — ABNORMAL LOW (ref 8.9–10.3)
Chloride: 107 mmol/L (ref 98–111)
Creatinine, Ser: 2.51 mg/dL — ABNORMAL HIGH (ref 0.44–1.00)
GFR, Estimated: 23 mL/min — ABNORMAL LOW (ref >60)
Glucose, Bld: 125 mg/dL — ABNORMAL HIGH (ref 70–99)
Potassium: 4.7 mmol/L (ref 3.5–5.1)
Sodium: 138 mmol/L (ref 135–145)
Total Bilirubin: 0.4 mg/dL (ref 0.3–1.2)
Total Protein: 6.3 g/dL — ABNORMAL LOW (ref 6.5–8.1)

## 2020-03-07 LAB — SODIUM, URINE, RANDOM: Sodium, Ur: 42 mmol/L

## 2020-03-07 LAB — CBC
HCT: 28.1 % — ABNORMAL LOW (ref 36.0–46.0)
Hemoglobin: 8.6 g/dL — ABNORMAL LOW (ref 12.0–15.0)
MCH: 26.4 pg (ref 26.0–34.0)
MCHC: 30.6 g/dL (ref 30.0–36.0)
MCV: 86.2 fL (ref 80.0–100.0)
Platelets: 171 10*3/uL (ref 150–400)
RBC: 3.26 MIL/uL — ABNORMAL LOW (ref 3.87–5.11)
RDW: 14.5 % (ref 11.5–15.5)
WBC: 6.5 10*3/uL (ref 4.0–10.5)
nRBC: 0 % (ref 0.0–0.2)

## 2020-03-07 LAB — SARS CORONAVIRUS 2 BY RT PCR (HOSPITAL ORDER, PERFORMED IN ~~LOC~~ HOSPITAL LAB): SARS Coronavirus 2: NEGATIVE

## 2020-03-07 LAB — LIPASE, BLOOD: Lipase: 26 U/L (ref 11–51)

## 2020-03-07 LAB — HEMOGLOBIN AND HEMATOCRIT, BLOOD
HCT: 26.6 % — ABNORMAL LOW (ref 36.0–46.0)
HCT: 23 % — ABNORMAL LOW (ref 36.0–46.0)
Hemoglobin: 8.2 g/dL — ABNORMAL LOW (ref 12.0–15.0)
Hemoglobin: 7.2 g/dL — ABNORMAL LOW (ref 12.0–15.0)

## 2020-03-07 LAB — CREATININE, URINE, RANDOM: Creatinine, Urine: 57 mg/dL

## 2020-03-07 LAB — FERRITIN: Ferritin: 18 ng/mL (ref 11–307)

## 2020-03-07 LAB — POC OCCULT BLOOD, ED: Fecal Occult Bld: POSITIVE — AB

## 2020-03-07 MED ORDER — ONDANSETRON HCL 4 MG/2ML IJ SOLN
4.0000 mg | Freq: Once | INTRAMUSCULAR | Status: AC
  Administered 2020-03-07: 4 mg via INTRAVENOUS
  Filled 2020-03-07: qty 2

## 2020-03-07 MED ORDER — LACTATED RINGERS IV BOLUS
500.0000 mL | Freq: Once | INTRAVENOUS | Status: AC
  Administered 2020-03-07: 500 mL via INTRAVENOUS

## 2020-03-07 MED ORDER — ONDANSETRON HCL 4 MG/2ML IJ SOLN: 4.0000 mg | Freq: Four times a day (QID) | INTRAMUSCULAR | Status: DC | PRN

## 2020-03-07 MED ORDER — PANTOPRAZOLE SODIUM 40 MG IV SOLR
40.0000 mg | Freq: Two times a day (BID) | INTRAVENOUS | Status: DC
  Administered 2020-03-07 – 2020-03-09 (×4): 40 mg via INTRAVENOUS
  Filled 2020-03-07 (×4): qty 40

## 2020-03-07 MED ORDER — MORPHINE SULFATE (PF) 2 MG/ML IV SOLN
2.0000 mg | INTRAVENOUS | Status: DC | PRN
  Administered 2020-03-07: 2 mg via INTRAVENOUS
  Filled 2020-03-07: qty 1

## 2020-03-07 MED ORDER — LACTATED RINGERS IV SOLN: INTRAVENOUS | Status: DC

## 2020-03-07 MED ORDER — SODIUM CHLORIDE 0.9% IV SOLUTION
Freq: Once | INTRAVENOUS | Status: AC
  Filled 2020-03-07: qty 250

## 2020-03-07 MED ORDER — PANTOPRAZOLE SODIUM 40 MG IV SOLR
40.0000 mg | Freq: Once | INTRAVENOUS | Status: AC
  Administered 2020-03-07: 40 mg via INTRAVENOUS
  Filled 2020-03-07: qty 40

## 2020-03-07 MED ORDER — LORAZEPAM 2 MG/ML IJ SOLN: 0.5000 mg | Freq: Four times a day (QID) | INTRAMUSCULAR | Status: DC | PRN

## 2020-03-07 NOTE — ED Notes (Signed)
Pt given sandwich tray 

## 2020-03-07 NOTE — H&P (Signed)
History and Physical    PLEASE NOTE THAT DRAGON DICTATION SOFTWARE WAS USED IN THE CONSTRUCTION OF THIS NOTE.   [name] NAT:[id number] DOB: 1970-07-04 DOA: 03/07/2020  PCP: [name], MD Patient coming from: home   I have personally briefly reviewed patient's old medical records in [place]  Chief Complaint: Dark-colored stool  HPI: [name] is a 50 y.o. female with medical history significant for chronic diastolic heart failure, hypertension, peptic ulcers severe disease with gastric ulcer in 2013, type 2 diabetes mellitus with most recent hemoglobin A1c 5.0% when checked in December 2021, chronic anemia with baseline hemoglobin 10-11, GERD, stage IIIb chronic kidney disease with baseline creatinine 1.5-2.0, recent hospitalization upper gastrointestinal bleeding, Roux en Y, who is admitted to [facility] on 03/07/2020 with suspected acute upper gastrointestinal bleeding after presenting from home to [facility] Emergency Department complaining of dark-colored stool.   The patient reports 5 days of dark-colored stools starting Saturday, 03/02/2020, noting 1-2 such episodes a day over that timeframe.  Denies any associated hematochezia.  She also denies any associated abdominal pain, nausea, vomiting.  Not associated with subjective fever, chills, rigors, or generalized myalgias.  No recent trauma.  Denies any associated chest pain, shortness of breath, palpitations, diaphoresis, dizziness, presyncope, or syncope.  She reports the above is very similar to the symptoms that she was experiencing at the time of her hospitalization in December 2021 for acute upper gastrointestinal bleed at which time she was admitted to [facility] for further evaluation of such, during which nuclear medicine bleeding scan showed evidence of bleeding from the stomach, which, in the setting of her history of Roux-en-Y resulted in transfer to [facility] for endoscopic evaluation.   While at [facility], her endoscopic evaluation showed no evidence of active bleeding, and the patient was discharged to home without any additional stool until 5 days ago.  Of note, the patient underwent left-sided heart cath in November 2021, at which time she received PCI with stent placement, following which she has been on dual antibiotic therapy with daily baby aspirin and Plavix.  She reports recent hospitalization for acute upper GI bleed, that she was instructed by her cardiologist to hold her home Plavix pending evaluation for acute upper GI bleed.  Following negative enteroscopy scan at [facility], she resumed her Plavix, and has been on dual antiplatelet therapy until she once again held her Plavix starting with the return of dark-colored stool last satuirday.  Has continued to take a daily baby aspirin since that time, with most recent such dose occurring this morning.  Otherwise, she reports that she is on no blood thinning agents at home.  Denies any NSAID use.  Denies any regular consumption of alcohol, and denies any known underlying history of liver pathology.  No recent traveling or known COVID-19 exposures.  She has received 2 doses of the [place] COVID-19 vaccine in March and April 2021, respectively.  Denies any recent rhinitis, rhinorrhea, neck stiffness, cough, or rash.    ED Course:  Vital signs in the ED were notable for the following: Temperature max 98.1, heart rate 70 and 95; blood pressure 123/71 - 126/82; respiratory rate 16-18; oxygen saturation 100% on room air.  Labs were notable for the following: CMP was notable for the following: Sodium 138, bicarbonate 22, BUN 42, relative to most recent prior value of 23 on 02/20/2020, creatinine 2.51 relative to most recent prior value of 1.94 on 02/20/2020, glucose 125.  CBC was  notable for white blood cell count of 6500, hemoglobin 8.6, which is relative to most recent prior value of 10.6 on 02/20/2020, platelets 171.  Rectal exam performed in  the ED revealed Hemoccult positive stool.  Screening nasopharyngeal COVID-19 PCR performed in the ED today was found to be negative.  The patient's case was discussed with the on-call gastroenterologist, Dr. Allen Norris, who recommended admission to the hospital service at [facility] for further evaluation management of suspected acute upper gastrointestinal bleed.  Specifically, Dr. Allen Norris, recommends pursuing nuclear medicine bleeding scan.  If the scan once again shows evidence of bleeding at the level of the stomach, the patient may need to be transferred back to [facility] for further endoscopic evaluation.  However, Dr. Allen Norris does not feel that this transfer is necessary at this time, GI will continue to follow, including for the results of the aforementioned nuclear medicine bleeding scan.  While in the ED, the following were administered: Protonix 40 mg IV x1, lactated Ringer's x500 cc bolus.    Review of Systems: As per HPI otherwise 10 point review of systems negative.   Past Medical History:  Diagnosis Date  . Acute colitis 01/27/2017  . Acute pyelonephritis   . Anemia    iron deficiency anemia  . Aortic arch aneurysm ([place])   . BRCA negative 2014  . CAD (coronary artery disease)    a. 08/2003 Cath: LAD 30-40-med Rx; b. 11/2014 PCI: LAD 51m(3.25x23 Xience Alpine DES); c. 06/2015 PCI: D1 (2.25x12 Resolute Integrity DES); d. 06/2017 PCI: Patent mLAD stent, D2 95 (PTCA); e. 09/2017 PCI: D2 99ost (CBA); d. 12/2017 Cath: LM nl, LAD 388m80d (small), D1 40ost, D2 95ost, LCX 40p, RCA 40ost/p->Med rx for D2 given restenosis.  . Colitis 06/03/2015  . Colon polyp   . CVA (cerebral vascular accident) ([place]   Left side weakness.   . Degenerative tear of glenoid labrum of right shoulder 03/15/2017  . Diabetes mellitus without complication ([facility]  . Family history of breast cancer    BRCA neg 2014  . Femur fracture, left ([place]9/10/2018  . Gastric ulcer 04/27/2011  . History of echocardiogram    a. 03/2017 Echo: EF  60-65%, no rwma; b. 02/2018 Echo: EF 60-65%, no rwma. Nl RV fxn. No cardiac source of emboli (admitted w/ stroke).  . Hypertension   . Malignant melanoma of skin of scalp ([facility]  . MI, acute, non ST segment elevation ([facility]  . Neuromuscular disorder ([facility]  . S/P drug eluting coronary stent placement 06/04/2015  . Sepsis ([facility]2/14/2019  . [facility]   a. 02/2018 MRI: 30m15mate acute/early subacute L medial frontal lobe inarct; b. 02/2018 MRA No large vessel occlusion or aneurysm. Mod to sev L P2 stenosis. thready L vertebral artery, diffusely dzs'd; c. 02/2018 Carotid U/S: <50% bilat ICA dzs.    Past Surgical History:  Procedure Laterality Date  . APPENDECTOMY    . BALLOON ENTEROSCOPY  02/06/2020   [facility]  . CARDIAC CATHETERIZATION N/A 11/09/2014   Procedure: Coronary Angiography;  Surgeon: TimMinna MerrittsD;  [facility];  Service: Cardiovascular;  Laterality: N/A;  . CARDIAC CATHETERIZATION N/A 11/12/2014   Procedure: Coronary Stent Intervention;  Surgeon: AleIsaias CowmanD;  [place];  Service: Cardiovascular;  Laterality: N/A;  . CARDIAC CATHETERIZATION N/A 04/18/2015   Procedure: Left Heart Cath and Coronary Angiography;  Surgeon: TimMinna MerrittsD;  [place];  Service:  Cardiovascular;  Laterality: N/A;  . CARDIAC CATHETERIZATION Left 06/04/2015   Procedure: Left Heart Cath and Coronary Angiography;  Surgeon: Wellington Hampshire, MD;  Location: [place] CV LAB;  Service: Cardiovascular;  Laterality: Left;  . CARDIAC CATHETERIZATION N/A 06/04/2015   Procedure: Coronary Stent Intervention;  Surgeon: Wellington Hampshire, MD;  Location: [place] CV LAB;  Service: Cardiovascular;  Laterality: N/A;  . CESAREAN SECTION  2001  . CHOLECYSTECTOMY N/A 11/18/2016   Procedure: LAPAROSCOPIC CHOLECYSTECTOMY WITH INTRAOPERATIVE CHOLANGIOGRAM;  Surgeon: Christene Lye, MD;  Location: [facility];  Service: General;   Laterality: N/A;  . COLONOSCOPY WITH PROPOFOL N/A 04/27/2016   Procedure: COLONOSCOPY WITH PROPOFOL;  Surgeon: [name], MD;  Location: [place];  Service: Endoscopy;  Laterality: N/A;  . COLONOSCOPY WITH PROPOFOL N/A 01/12/2018   Procedure: COLONOSCOPY WITH PROPOFOL;  Surgeon: [place], Benay Pike, MD;  Location: [facility];  Service: Endoscopy;  Laterality: N/A;  . CORONARY ANGIOPLASTY    . CORONARY BALLOON ANGIOPLASTY N/A 06/29/2017   Procedure: CORONARY BALLOON ANGIOPLASTY;  Surgeon: Wellington Hampshire, MD;  Location: [place] [facility] LAB;  Service: Cardiovascular;  Laterality: N/A;  . CORONARY BALLOON ANGIOPLASTY N/A 09/20/2017   Procedure: CORONARY BALLOON ANGIOPLASTY;  Surgeon: Wellington Hampshire, MD;  Location: [place] CV LAB;  Service: Cardiovascular;  Laterality: N/A;  . CORONARY STENT INTERVENTION N/A 12/13/2019   Procedure: CORONARY STENT INTERVENTION;  Surgeon: Wellington Hampshire, MD;  Location: [place] CV LAB;  Service: Cardiovascular;  Laterality: N/A;  . DILATION AND CURETTAGE OF UTERUS    . ESOPHAGOGASTRODUODENOSCOPY (EGD) WITH PROPOFOL N/A 09/14/2014   Procedure: ESOPHAGOGASTRODUODENOSCOPY (EGD) WITH PROPOFOL;  Surgeon: Josefine Class, MD;  Location: [facility] ENDOSCOPY;  Service: Endoscopy;  Laterality: N/A;  . ESOPHAGOGASTRODUODENOSCOPY (EGD) WITH PROPOFOL N/A 04/27/2016   Procedure: ESOPHAGOGASTRODUODENOSCOPY (EGD) WITH PROPOFOL;  Surgeon: [name], MD;  Location: [place];  Service: Endoscopy;  Laterality: N/A;  Diabetic - oral meds  . ESOPHAGOGASTRODUODENOSCOPY (EGD) WITH PROPOFOL N/A 01/12/2018   Procedure: ESOPHAGOGASTRODUODENOSCOPY (EGD) WITH PROPOFOL;  Surgeon: [place], Benay Pike, MD;  Location: [facility];  Service: Endoscopy;  Laterality: N/A;  . ESOPHAGOGASTRODUODENOSCOPY (EGD) WITH PROPOFOL N/A 04/11/2019   Procedure: ESOPHAGOGASTRODUODENOSCOPY (EGD) WITH PROPOFOL;  Surgeon: [name], MD;  Location: [facility];  Service:  Endoscopy;  Laterality: N/A;  . GASTRIC BYPASS  09/2009   [place] (IM) NAIL INTERTROCHANTERIC Left 10/13/2018   Procedure: INTRAMEDULLARY (IM) NAIL INTERTROCHANTRIC;  Surgeon: Leandrew Koyanagi, MD;  Location: [place];  Service: Orthopedics;  Laterality: Left;  . Left Carotid to sublcavian artery bypass w/ subclavian artery ligation     a. [facility]formed @ [facility].  . LEFT HEART CATH AND CORONARY ANGIOGRAPHY Left 06/29/2017   Procedure: LEFT HEART CATH AND CORONARY ANGIOGRAPHY;  Surgeon: Wellington Hampshire, MD;  Location: [place] CV LAB;  Service: Cardiovascular;  Laterality: Left;  . LEFT HEART CATH AND CORONARY ANGIOGRAPHY N/A 09/20/2017   Procedure: LEFT HEART CATH AND CORONARY ANGIOGRAPHY;  Surgeon: Wellington Hampshire, MD;  Location: [place] CV LAB;  Service: Cardiovascular;  Laterality: N/A;  . LEFT HEART CATH AND CORONARY ANGIOGRAPHY N/A 12/20/2017   Procedure: LEFT HEART CATH AND CORONARY ANGIOGRAPHY;  Surgeon: Wellington Hampshire, MD;  Location: [place] CV LAB;  Service: Cardiovascular;  Laterality: N/A;  . LEFT HEART CATH AND CORONARY ANGIOGRAPHY N/A 04/20/2019   Procedure: LEFT HEART CATH AND CORONARY ANGIOGRAPHY possible PCI;  Surgeon: Minna Merritts, MD;  Location: [place] CV LAB;  Service: Cardiovascular;  Laterality: N/A;  . LEFT HEART CATH AND CORONARY ANGIOGRAPHY N/A 12/13/2019   Procedure: LEFT HEART CATH AND CORONARY ANGIOGRAPHY;  Surgeon: Wellington Hampshire, MD;  Location: [place] CV LAB;  Service: Cardiovascular;  Laterality: N/A;  . MELANOMA EXCISION  2016   Dr. Evorn Gong  . [place]  2002  . RIGHT OOPHORECTOMY    . SHOULDER ARTHROSCOPY WITH OPEN ROTATOR CUFF REPAIR Right 01/07/2016   Procedure: SHOULDER ARTHROSCOPY WITH DEBRIDMENT, SUBACHROMIAL DECOMPRESSION;  Surgeon: Corky Mull, MD;  Location: [facility];  Service: Orthopedics;  Laterality: Right;  . SHOULDER ARTHROSCOPY WITH OPEN ROTATOR CUFF REPAIR Right 03/16/2017    Procedure: SHOULDER ARTHROSCOPY WITH OPEN ROTATOR CUFF REPAIR POSSIBLE BICEPS TENODESIS;  Surgeon: Corky Mull, MD;  Location: [facility];  Service: Orthopedics;  Laterality: Right;  . TRIGGER FINGER RELEASE Right     [place]    Social History:  reports that she quit smoking about 25 years ago. Her smoking use included cigarettes. She has a 0.25 pack-year smoking history. She has never used smokeless tobacco. She reports that she does not drink alcohol and does not use drugs.   Allergies  Allergen Reactions  . Lipitor [Atorvastatin] Other (See Comments)    Leg pains  . Tramadol Other (See Comments)    Mouth feels like it's on fire    Family History  Problem Relation Age of Onset  . Hypertension Mother   . Anxiety disorder Mother   . Depression Mother   . Bipolar disorder Mother   . Heart disease Mother        No details  . Hyperlipidemia Mother   . Kidney disease Father   . Heart disease Father 39  . Hypertension Father   . Diabetes Father   . Stroke Father   . Colon cancer Father        dx in his 2's  . Anxiety disorder Father   . Depression Father   . Skin cancer Father   . Kidney disease Sister   . Thyroid nodules Sister   . Hypertension Sister   . Hypertension Sister   . Diabetes Sister   . Hyperlipidemia Sister   . Depression Sister   . Breast cancer Maternal Aunt 80  . Breast cancer Maternal Aunt 55  . Ovarian cancer Cousin   . Colon cancer Cousin   . Kidney cancer Cousin   . Breast cancer Other   . Bladder Cancer Neg Hx      Prior to Admission medications   Medication Sig Start Date End Date Taking? Authorizing Provider  ALPRAZolam Duanne Moron) 1 MG tablet Take 1 tablet by mouth twice daily as needed 02/16/20   [name], MD  amLODipine (NORVASC) 2.5 MG tablet Take 1 tablet (2.5 mg total) by mouth daily. 10/25/19   Minna Merritts, MD  buPROPion (WELLBUTRIN SR) 200 MG 12 hr tablet Take 200 mg by mouth every morning. 11/17/19   [provider]  carvedilol (COREG) 25 MG tablet Take 1 tablet (25 mg total) by mouth 2 (two) times daily with a meal. 02/27/20   Gollan, Kathlene November, MD  desvenlafaxine (PRISTIQ) 50 MG 24 hr tablet Take 50 mg by mouth daily. 12/11/19   [provider]  diphenoxylate-atropine (LOMOTIL) 2.5-0.025 MG tablet Take 1 tablet by mouth 4 (four) times daily as needed for diarrhea or loose stools. 01/29/20   [name], MD  EUTHYROX 25 MCG tablet TAKE  1 TABLET BY MOUTH ONCE DAILY BEFORE BREAKFAST 12/02/19   [name], MD  famotidine (PEPCID) 20 MG tablet Take 20 mg by mouth 2 (two) times daily.    [provider]  gabapentin (NEURONTIN) 300 MG capsule Take 2 capsules (600 mg total) by mouth 2 (two) times daily. 01/17/20   [name], MD  isosorbide mononitrate (IMDUR) 120 MG 24 hr tablet Take 1 tablet (120 mg total) by mouth daily. 10/21/19   Dwyane Dee, MD  nitroGLYCERIN (NITROSTAT) 0.4 MG SL tablet Place 1 tablet (0.4 mg total) under the tongue every 5 (five) minutes as needed for chest pain. 10/20/19   Dwyane Dee, MD  pantoprazole (PROTONIX) 40 MG tablet Take 1 tablet by mouth twice daily 06/06/19   [name], MD  Polyethyl Glycol-Propyl Glycol (SYSTANE OP) Place 1 drop into both eyes daily as needed (dry eyes).    [provider]  promethazine (PHENERGAN) 25 MG tablet TAKE 1 TABLET BY MOUTH EVERY 6 HOURS AS NEEDED FOR NAUSEA FOR VOMITING 02/07/20   [name], MD  rosuvastatin (CRESTOR) 40 MG tablet Take 1 tablet (40 mg total) by mouth daily at 6 PM. 10/25/19   [name], Kathlene November, MD  traZODone (DESYREL) 100 MG tablet TAKE 1 TO 2 TABLETS BY MOUTH AT BEDTIME 11/15/19   [name], MD  UBRELVY 100 MG TABS Take 100 mg by mouth 2 (two) times daily as needed (migraine).  09/13/19   [provider]     Objective    Physical Exam: Vitals:   03/07/20 1401 03/07/20 1402 03/07/20 1445  BP: 126/82  123/71  Pulse: 95  70  Resp: 16  16  Temp:  98.1 F (36.7 C)    TempSrc: Oral    SpO2: 100%  100%  Weight:  69.4 kg     General: appears to be stated age; alert, oriented Skin: warm, dry, no rash Head:  AT/[place] Mouth:  Oral mucosa membranes appear dry, normal dentition Neck: supple; trachea midline Heart:  RRR; did not appreciate any M/R/G Lungs: CTAB, did not appreciate any wheezes, rales, or rhonchi Abdomen: + BS; soft, ND, NT Vascular: 2+ pedal pulses b/l; 2+ radial pulses b/l Extremities: no peripheral edema, no muscle wasting Neuro: strength and sensation intact in upper and lower extremities b/l   Labs on Admission: I have personally reviewed following labs and imaging studies  CBC: Recent Labs  Lab 03/07/20 1405  WBC 6.5  HGB 8.6*  HCT 28.1*  MCV 86.2  PLT 174   Basic Metabolic Panel: Recent Labs  Lab 03/07/20 1405  NA 138  K 4.7  CL 107  CO2 22  GLUCOSE 125*  BUN 42*  CREATININE 2.51*  CALCIUM 8.5*   GFR: Estimated Creatinine Clearance: 24.8 mL/min (A) (by C-G formula based on SCr of 2.51 mg/dL (H)). Liver Function Tests: Recent Labs  Lab 03/07/20 1405  AST 23  ALT 17  ALKPHOS 88  BILITOT 0.4  PROT 6.3*  ALBUMIN 3.0*   Recent Labs  Lab 03/07/20 1405  LIPASE 26   No results for input(s): AMMONIA in the last 168 hours. Coagulation Profile: No results for input(s): INR, PROTIME in the last 168 hours. Cardiac Enzymes: No results for input(s): CKTOTAL, CKMB, CKMBINDEX, TROPONINI in the last 168 hours. BNP (last 3 results) No results for input(s): PROBNP in the last 8760 hours. HbA1C: No results for input(s): HGBA1C in the last 72 hours. CBG: No results for input(s): GLUCAP in  the last 168 hours. Lipid Profile: No results for input(s): CHOL, HDL, LDLCALC, TRIG, CHOLHDL, LDLDIRECT in the last 72 hours. Thyroid Function Tests: No results for input(s): TSH, T4TOTAL, FREET4, T3FREE, THYROIDAB in the last 72 hours. Anemia Panel: No results for input(s): VITAMINB12, FOLATE, FERRITIN,  TIBC, IRON, RETICCTPCT in the last 72 hours. Urine analysis:    Component Value Date/Time   COLORURINE YELLOW (A) 10/18/2019 0826   APPEARANCEUR HAZY (A) 10/18/2019 0826   APPEARANCEUR Cloudy (A) 07/17/2014 1455   LABSPEC 1.013 10/18/2019 0826   LABSPEC 1.012 12/29/2013 1210   PHURINE 5.0 10/18/2019 0826   GLUCOSEU NEGATIVE 10/18/2019 0826   GLUCOSEU >=500 12/29/2013 1210   HGBUR NEGATIVE 10/18/2019 0826   HGBUR negative 04/14/2010 1435   BILIRUBINUR negative 01/09/2020 0927   BILIRUBINUR Negative 07/17/2014 1455   BILIRUBINUR Negative 12/29/2013 1210   KETONESUR NEGATIVE 10/18/2019 0826   PROTEINUR Negative 01/09/2020 0927   PROTEINUR NEGATIVE 10/18/2019 0826   UROBILINOGEN 0.2 01/09/2020 0927   UROBILINOGEN negative 04/14/2010 1435   NITRITE positive 01/09/2020 0927   NITRITE NEGATIVE 10/18/2019 0826   LEUKOCYTESUR Moderate (2+) (A) 01/09/2020 0927   LEUKOCYTESUR LARGE (A) 10/18/2019 0826   LEUKOCYTESUR Negative 12/29/2013 1210    Radiological Exams on Admission: No results found.     Assessment/Plan    [name] is a 50 y.o. female with medical history significant for chronic diastolic heart failure, hypertension, peptic ulcers severe disease with gastric ulcer in 2013, type 2 diabetes mellitus with most recent hemoglobin A1c 5.0% when checked in December 2021, chronic anemia with baseline hemoglobin 10-11, GERD, stage IIIb chronic kidney disease with baseline creatinine 1.5-2.0, recent hospitalization upper gastrointestinal bleeding, Roux en Y, who is admitted to [facility] on 03/07/2020 with suspected acute upper gastrointestinal bleeding after presenting from home to [facility] Emergency Department complaining of dark-colored stool.    Principal Problem:   Acute upper GI bleed Active Problems:   Essential hypertension   Chronic systolic CHF (congestive heart failure) (HCC)   Melena   Acute on chronic anemia   AKI (acute kidney injury)  ([place])    #) Acute Upper GI Bleed: diagnosis on the basis of 5 days of dark-colored stool, with elevation of BUN, fecal occult positive test performed in the ED today, and evidence of acute on chronic anemia per presenting labs.  She is on dual antiplatelet therapy as an outpatient with daily baby aspirin as well as Plavix in the setting of PCI with stent placement in November 2021, although she has held her Plavix while continuing to take her aspirin since onset of the dark-colored stools 5 days ago, at the instruction of her cardiologist, as further described above.  Denies any NSAID use.  No known history of liver disease or alcohol abuse.  This is in the context of a history of Roux-en-Y, with a similar presentation leading to hospitalization in December 2021 for further evaluation of acute upper gastrointestinal bleed, with ensuing nuclear medicine bleeding scan revealing evidence of bleeding at the level of the stomach prompting transfer to [facility] for endoscopic evaluation, which showed no evidence of active bleed, as further detailed above.   In the absence of known liver disease, initiation of SBP prophylaxis does not appear to be warranted. Presentation and history are less suggestive of variceal bleed, and therefore there does not appear to be an indication for octreotide.    At this time, the patient appears hemodynamically stable, with normotensive blood pressures in the  absence of any associated tachycardia. Presentation appears to be associated with acute blood loss anemia, as further described below.. Asymptomatic. Given suspected upper GI source, will continue IV Protonix.  The patient's case was discussed with the on-call gastroenterologist, Dr. Allen Norris, who recommended admission to the hospital service at [facility] for further evaluation management of suspected acute upper gastrointestinal bleed.  Specifically, Dr. Allen Norris, recommends pursuing nuclear medicine bleeding scan.  If the scan once again  shows evidence of bleeding at the level of the stomach, the patient may need to be transferred back to [facility] for further endoscopic evaluation.  However, Dr. Allen Norris does not feel that this transfer is necessary at this time, GI will continue to follow, including for the results of the aforementioned nuclear medicine bleeding scan.  Typed and crossed in the ED today, but has not received any PRBC transfusion at this point given her current asymptomatic nature as well as in the context of her hemodynamic stability.        Plan: NPO. Refraining from pharmacologic DVT prophylaxis. Monitor on telemetry. Monitor continuous pulse-ox. Maintain at least 2 large bore IV's. Check INR. Q4H H&H's have been ordered through 9 AM tomorrow morning. Will closely monitor these ensuing Hgb levels and correlate these data points with the patient's overall clinical picture including vital signs to determine need for subsequent transfusion.  IV Protonix.  Gentle IV fluids in the form of lactated Ringer's at 50 cc/h.  Repeat CMP and CBC in the morning.  Gastroenterology formally consulted, as above.  Nuclear medicine bleeding scan per recommendations associated with this GI consult.  We will add on TIBC, total iron, and ferritin to admission labs.  We will continue to hold Plavix, consider resumption of baby aspirin tomorrow morning, per reported recommendations of patient's outpatient cardiologist.       #) Acute on chronic anemia: In context of chronic anemia associated with baseline hemoglobin 10-11, presenting hemoglobin found to be 8.6.  This is relative to most recent prior value of 10.6 on 02/20/2020, and is felt to be on the basis of suspected acute upper gastrointestinal bleed, as above.  At this time, the patient appears hemodynamically stable, and asymptomatic, as further described above.  Was typed and screened in the ED today, but has not yet required transfusion.  Will closely monitor ensuing vital signs as well as  serial every 4 hour H&H values throughout the evening, as further detailed below, while pursuing further evaluation of suspected acute upper gastrointestinal bleed, as outlined above.    Plan: work-up and management for presenting suspected acute upper GI bleed, as above, including close monitoring of Q4H H&H's, with clinical evaluation for determination of need for blood transfusion, as further described above. Monitor on telemetry. Monitor continuous pulse-ox. NPO. Refraining from pharmacologic DVT prophylaxis. Check INR.  Nuclear medicine bleeding scan per GI consultation, as above.  Add on iron labs, as above.     #) Acute kidney injury on CKD 3B: The context of stage IIIb chronic kidney disease with baseline creatinine 1.5-2.0, presenting creatinine noted to be 2.51, which is relative to most recent prior value 1.4 on 02/20/2020.  Suspect that this is prerenal in nature in the setting of intravascular depletion as a result of acute on chronic anemia as a consequence of suspected acute upper GI bleed, as further described above.  Plan: Check urinalysis with microscopy.  Add on random urine sodium as well as random urine creatinine.  Monitor strict I's and O's and daily weights.  Attempt avoid nephrotoxic agents.  Gentle IV fluids, as above.  Repeat BMP in the morning.  Work-up and management of acute on chronic anemia in the setting of acute upper GI bleed, as above.      #) Essential hypertension: On Norvasc Imdur and Coreg as an outpatient.  Pressures in the ED have been normotensive thus far.  In the setting of presenting acute upper gastrointestinal bleed, will hold antihypertensive medications for now.  Plan: Hold blood pressure medications for now, as above.  Close monitoring of ensuing blood pressure via routine vital signs.     #) Type 2 diabetes mellitus: Managed with lifestyle modifications in the absence of use of exogenous insulin or oral hypoglycemic agents as an outpatient.   Most recent hemoglobin A1c was found to be 5.0% on 01/25/2020, and reflected ongoing management via exclusive lifestyle modifications as above.  Presenting blood sugar per presenting CMP noted to be 125.  Plan: We will evaluate glucose per tomorrow morning CMP.  Does not appear that she requires ongoing POC glucose monitoring, given how well her hemoglobin A1c is controlled as an outpatient in the absence of any insulin or oral hypoglycemic agents.      #) Hyperlipidemia: On rosuvastatin as an outpatient.  Plan: In the setting of current n.p.o. status, will hold home rosuvastatin for now.      #) Chronic diastolic heart failure: With most recent echocardiogram in October 2021 showing LVEF 60 to 65% and grade 1 diastolic dysfunction.  No clinical evidence to suggest decompensated heart failure at this time.  Rather, the patient appears clinically to be a little bit dry at this time, likely in the setting of acute upper gastrointestinal bleed.  Of note, she is on no scheduled diuretic at home.  Plan: Monitor strict I's and O's and daily weights.  Repeat CMP in the morning.  Check serum magnesium level.  Monitor on continuous pulse oximetry.     DVT prophylaxis: scd's  Code Status: Full code Family Communication: none Disposition Plan: Per Rounding Team Consults called: Dr. Allen Norris of GI consulted, as further described above Admission status: Inpatient; pcu   Of note, this patient was added by me to the following Admit List/Treatment Team:  armcadmits     PLEASE NOTE THAT DRAGON DICTATION SOFTWARE WAS USED IN THE CONSTRUCTION OF THIS NOTE.   [facility] Pager [phone] From 12PM- 12AM  Otherwise, please contact night-coverage  [email or web address] Password [email or web address]  03/07/2020, 4:01 PM

## 2020-03-07 NOTE — Consult Note (Signed)
Kendra Lame, MD [facility]  [address] [place],  [address] Phone: [phone] Fax : [phone]  Consultation  Referring Provider:     Dr. Shelbie Ammons Primary Care Physician:  [name], MD Primary Gastroenterologist:  Dr. Allen Norris         Reason for Consultation:     GI bleeding  Date of Admission:  03/07/2020 Date of Consultation:  03/07/2020         HPI:   [name] is a 50 y.o. female Who has a history of recurrent GI bleeding who now reports that she is been having black stools and feeling weak.  The patient had a gastric bypass with a Roux-en-Y and has a history of hypertension hyperlipidemia diabetes congestive heart failure a PE and SVT.  The patient states that she's been having dark stools.  The patient was seen back in our hospital in December and had a bleeding scan that suggested she may be bleeding from a lesion in the stomach.  Since the patient had a gastric bypass with a Roux-en-Y it was deemed necessary for the patient to be sent to [facility] for a double-balloon enteroscopy to try to reach the bleeding source.  On January 4 the patient reports that she underwent a double balloon enteroscopy with no sign of bleeding found.  The patient was then recommended to restart her anticoagulation therapy.  The patient now reports that she is been having dark stools for the last few days and feeling more weak.Her blood work showed:  Component     Latest Ref Rng & Units 01/26/2020 01/26/2020 02/20/2020 03/07/2020         3:28 PM  9:47 PM    Hemoglobin     12.0 - 15.0 g/dL 8.7 (L) 8.9 (L) 10.6 (L) 8.6 (L)  HCT     36.0 - 46.0 % 27.8 (L) 28.1 (L) 33.1 (L) 28.1 (L)    I'm now being asked to see the patient for dark stools and a possible GI bleed.  Past Medical History:  Diagnosis Date  . Acute colitis 01/27/2017  . Acute pyelonephritis   . Anemia    iron deficiency anemia  . 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 BRCA negative 2014  . CAD (coronary artery disease)     a. 08/2003 Cath: LAD 30-40-med Rx; b. 11/2014 PCI: LAD 76m(3.25x23 Xience Alpine DES); c. 06/2015 PCI: D1 (2.25x12 Resolute Integrity DES); d. 06/2017 PCI: Patent mLAD stent, D2 95 (PTCA); e. 09/2017 PCI: D2 99ost (CBA); d. 12/2017 Cath: LM nl, LAD 353m80d (small), D1 40ost, D2 95ost, LCX 40p, RCA 40ost/p->Med rx for D2 given restenosis.  . Colitis 06/03/2015  . Colon polyp   . CVA (cerebral vascular accident) ([facility]   Left side weakness.   . Degenerative tear of glenoid labrum of right shoulder 03/15/2017  . Diabetes mellitus without complication ([facility]  . Family history of breast cancer    BRCA neg 2014  . Femur fracture, left ([facility] Base9/10/2018  . Gastric ulcer 04/27/2011  . History of echocardiogram    a. 03/2017 Echo: EF 60-65%, no rwma; b. 02/2018 Echo: EF 60-65%, no rwma. Nl RV fxn. No cardiac source of emboli (admitted w/ stroke).  . Hypertension   . Malignant melanoma of skin of scalp ([facility]  . MI, acute, non ST segment elevation ([facility]  . Neuromuscular disorder ([facility]  . S/P drug eluting coronary stent placement 06/04/2015  . Sepsis (  [place]) 03/18/2017  . Stroke [facility])    a. 02/2018 MRI: 41m late acute/early subacute L medial frontal lobe inarct; b. 02/2018 MRA No large vessel occlusion or aneurysm. Mod to sev L P2 stenosis. thready L vertebral artery, diffusely dzs'd; c. 02/2018 Carotid U/S: <50% bilat ICA dzs.    Past Surgical History:  Procedure Laterality Date  . APPENDECTOMY    . BALLOON ENTEROSCOPY  02/06/2020   [facility]  . CARDIAC CATHETERIZATION N/A 11/09/2014   Procedure: Coronary Angiography;  Surgeon: TMinna Merritts MD;  [place];  Service: Cardiovascular;  Laterality: N/A;  . CARDIAC CATHETERIZATION N/A 11/12/2014   Procedure: Coronary Stent Intervention;  Surgeon: AIsaias Cowman MD;  [place];  Service: Cardiovascular;  Laterality: N/A;  . CARDIAC CATHETERIZATION N/A 04/18/2015   Procedure: Left Heart Cath and Coronary Angiography;   Surgeon: TMinna Merritts MD;  [facility];  Service: Cardiovascular;  Laterality: N/A;  . CARDIAC CATHETERIZATION Left 06/04/2015   Procedure: Left Heart Cath and Coronary Angiography;  Surgeon: MWellington Hampshire MD;  Location: [facility];  Service: Cardiovascular;  Laterality: Left;  . CARDIAC CATHETERIZATION N/A 06/04/2015   Procedure: Coronary Stent Intervention;  Surgeon: MWellington Hampshire MD;  Location: [facility];  Service: Cardiovascular;  Laterality: N/A;  . CESAREAN SECTION  2001  . CHOLECYSTECTOMY N/A 11/18/2016   Procedure: LAPAROSCOPIC CHOLECYSTECTOMY WITH INTRAOPERATIVE CHOLANGIOGRAM;  Surgeon: SChristene Lye MD;  Location: [facility] OR[facility];  Service: General;  Laterality: N/A;  . COLONOSCOPY WITH PROPOFOL N/A 04/27/2016   Procedure: COLONOSCOPY WITH PROPOFOL;  Surgeon: DLucilla Lame MD;  [place]  Service: Endoscopy;  Laterality: N/A;  . COLONOSCOPY WITH PROPOFOL N/A 01/12/2018   Procedure: COLONOSCOPY WITH PROPOFOL;  Surgeon: Toledo, TBenay Pike MD;  Location: [facility] ENDOSCOPY;  Service: Endoscopy;  Laterality: N/A;  . CORONARY ANGIOPLASTY    . CORONARY BALLOON ANGIOPLASTY N/A 06/29/2017   Procedure: CORONARY BALLOON ANGIOPLASTY;  Surgeon: AWellington Hampshire MD;  [place];  Service: Cardiovascular;  Laterality: N/A;  . CORONARY BALLOON ANGIOPLASTY N/A 09/20/2017   Procedure: CORONARY BALLOON ANGIOPLASTY;  Surgeon: AWellington Hampshire MD;  Location: [place];  Service: Cardiovascular;  Laterality: N/A;  . CORONARY STENT INTERVENTION N/A 12/13/2019   Procedure: CORONARY STENT INTERVENTION;  Surgeon: AWellington Hampshire MD;  Location[place];  Service: Cardiovascular;  Laterality: N/A;  . DILATION AND CURETTAGE OF UTERUS    . ESOPHAGOGASTRODUODENOSCOPY (EGD) WITH PROPOFOL N/A 09/14/2014   Procedure: ESOPHAGOGASTRODUODENOSCOPY (EGD) WITH PROPOFOL;  Surgeon: MJosefine Class MD;   [facility];  Service: Endoscopy;  Laterality: N/A;  . ESOPHAGOGASTRODUODENOSCOPY (EGD) WITH PROPOFOL N/A 04/27/2016   Procedure: ESOPHAGOGASTRODUODENOSCOPY (EGD) WITH PROPOFOL;  Surgeon: DLucilla Lame MD;  [place]: [place]  Service: Endoscopy;  Laterality: N/A;  Diabetic - oral meds  . ESOPHAGOGASTRODUODENOSCOPY (EGD) WITH PROPOFOL N/A 01/12/2018   Procedure: ESOPHAGOGASTRODUODENOSCOPY (EGD) WITH PROPOFOL;  Surgeon: Toledo, TBenay Pike MD;  Location: [facility];  Service: Endoscopy;  Laterality: N/A;  . ESOPHAGOGASTRODUODENOSCOPY (EGD) WITH PROPOFOL N/A 04/11/2019   Procedure: ESOPHAGOGASTRODUODENOSCOPY (EGD) WITH PROPOFOL;  Surgeon: WLucilla Lame MD;  Location: [facility];  Service: Endoscopy;  Laterality: N/A;  . GASTRIC BYPASS  09/2009   [place] NAIL INTERTROCHANTERIC Left 10/13/2018   Procedure: INTRAMEDULLARY (IM) NAIL INTERTROCHANTRIC;  Surgeon: XLeandrew Koyanagi MD;  Location: [facility]  Service: Orthopedics;  Laterality: Left;  . Left Carotid to sublcavian artery bypass w/ subclavian artery ligation     a. Performed @ [facility].  . LEFT HEART CATH AND CORONARY ANGIOGRAPHY Left 06/29/2017   Procedure: LEFT HEART CATH AND CORONARY ANGIOGRAPHY;  Surgeon: Wellington Hampshire, MD;  Location: [place] CV LAB;  Service: Cardiovascular;  Laterality: Left;  . LEFT HEART CATH AND CORONARY ANGIOGRAPHY N/A 09/20/2017   Procedure: LEFT HEART CATH AND CORONARY ANGIOGRAPHY;  Surgeon: Wellington Hampshire, MD;  Location: [place] CV LAB;  Service: Cardiovascular;  Laterality: N/A;  . LEFT HEART CATH AND CORONARY ANGIOGRAPHY N/A 12/20/2017   Procedure: LEFT HEART CATH AND CORONARY ANGIOGRAPHY;  Surgeon: Wellington Hampshire, MD;  Location: [place] CV LAB;  Service: Cardiovascular;  Laterality: N/A;  . LEFT HEART CATH AND CORONARY ANGIOGRAPHY N/A 04/20/2019   Procedure: LEFT HEART CATH AND CORONARY ANGIOGRAPHY possible PCI;  Surgeon:  Minna Merritts, MD;  Location: [place] CV LAB;  Service: Cardiovascular;  Laterality: N/A;  . LEFT HEART CATH AND CORONARY ANGIOGRAPHY N/A 12/13/2019   Procedure: LEFT HEART CATH AND CORONARY ANGIOGRAPHY;  Surgeon: Wellington Hampshire, MD;  Location: [place] CV LAB;  Service: Cardiovascular;  Laterality: N/A;  . MELANOMA EXCISION  2016   Dr. Evorn Gong  . Progress  2002  . RIGHT OOPHORECTOMY    . SHOULDER ARTHROSCOPY WITH OPEN ROTATOR CUFF REPAIR Right 01/07/2016   Procedure: SHOULDER ARTHROSCOPY WITH DEBRIDMENT, SUBACHROMIAL DECOMPRESSION;  Surgeon: Corky Mull, MD;  Location: [facility];  Service: Orthopedics;  Laterality: Right;  . SHOULDER ARTHROSCOPY WITH OPEN ROTATOR CUFF REPAIR Right 03/16/2017   Procedure: SHOULDER ARTHROSCOPY WITH OPEN ROTATOR CUFF REPAIR POSSIBLE BICEPS TENODESIS;  Surgeon: Corky Mull, MD;  Location: [facility];  Service: Orthopedics;  Laterality: Right;  . TRIGGER FINGER RELEASE Right     [place]    Prior to Admission medications   Medication Sig Start Date End Date Taking? Authorizing Provider  ALPRAZolam Duanne Moron) 1 MG tablet Take 1 tablet by mouth twice daily as needed 02/16/20   [name], MD  amLODipine (NORVASC) 2.5 MG tablet Take 1 tablet (2.5 mg total) by mouth daily. 10/25/19   Minna Merritts, MD  buPROPion (WELLBUTRIN SR) 200 MG 12 hr tablet Take 200 mg by mouth every morning. 11/17/19   [provider]  carvedilol (COREG) 25 MG tablet Take 1 tablet (25 mg total) by mouth 2 (two) times daily with a meal. 02/27/20   Gollan, Kathlene November, MD  desvenlafaxine (PRISTIQ) 50 MG 24 hr tablet Take 50 mg by mouth daily. 12/11/19   [provider]  diphenoxylate-atropine (LOMOTIL) 2.5-0.025 MG tablet Take 1 tablet by mouth 4 (four) times daily as needed for diarrhea or loose stools. 01/29/20   Kendra Lame, MD  EUTHYROX 25 MCG tablet TAKE 1 TABLET BY MOUTH ONCE DAILY BEFORE BREAKFAST 12/02/19   [name], MD  famotidine  (PEPCID) 20 MG tablet Take 20 mg by mouth 2 (two) times daily.    [provider]  gabapentin (NEURONTIN) 300 MG capsule Take 2 capsules (600 mg total) by mouth 2 (two) times daily. 01/17/20   [name], MD  isosorbide mononitrate (IMDUR) 120 MG 24 hr tablet Take 1 tablet (120 mg total) by mouth daily. 10/21/19   Dwyane Dee, MD  nitroGLYCERIN (NITROSTAT) 0.4 MG SL tablet Place 1 tablet (0.4 mg total) under the tongue every 5 (five) minutes as needed for chest pain. 10/20/19  Dwyane Dee, MD  pantoprazole (PROTONIX) 40 MG tablet Take 1 tablet by mouth twice daily 06/06/19   [name], MD  Polyethyl Glycol-Propyl Glycol (SYSTANE OP) Place 1 drop into both eyes daily as needed (dry eyes).    [provider]  promethazine (PHENERGAN) 25 MG tablet TAKE 1 TABLET BY MOUTH EVERY 6 HOURS AS NEEDED FOR NAUSEA FOR VOMITING 02/07/20   [name], MD  rosuvastatin (CRESTOR) 40 MG tablet Take 1 tablet (40 mg total) by mouth daily at 6 PM. 10/25/19   [name], Kathlene November, MD  traZODone (DESYREL) 100 MG tablet TAKE 1 TO 2 TABLETS BY MOUTH AT BEDTIME 11/15/19   [name], MD  UBRELVY 100 MG TABS Take 100 mg by mouth 2 (two) times daily as needed (migraine).  09/13/19   [provider]    Family History  Problem Relation Age of Onset  . Hypertension Mother   . Anxiety disorder Mother   . Depression Mother   . Bipolar disorder Mother   . Heart disease Mother        No details  . Hyperlipidemia Mother   . Kidney disease Father   . Heart disease Father 70  . Hypertension Father   . Diabetes Father   . Stroke Father   . Colon cancer Father        dx in his 9's  . Anxiety disorder Father   . Depression Father   . Skin cancer Father   . Kidney disease Sister   . Thyroid nodules Sister   . Hypertension Sister   . Hypertension Sister   . Diabetes Sister   . Hyperlipidemia Sister   . Depression Sister   . Breast cancer Maternal Aunt 14  . Breast cancer  Maternal Aunt 26  . Ovarian cancer Cousin   . Colon cancer Cousin   . Kidney cancer Cousin   . Breast cancer Other   . Bladder Cancer Neg Hx      Social History   Tobacco Use  . Smoking status: Former Smoker    Packs/day: 0.25    Years: 1.00    Pack years: 0.25    Types: Cigarettes    Quit date: 08/31/1994    Years since quitting: 25.5  . Smokeless tobacco: Never Used  . Tobacco comment: quit 28 years ago  Vaping Use  . Vaping Use: Never used  Substance Use Topics  . Alcohol use: No    Alcohol/week: 0.0 standard drinks  . Drug use: No    Allergies as of 03/07/2020 - Review Complete 03/07/2020  Allergen Reaction Noted  . Lipitor [atorvastatin] Other (See Comments) 04/14/2017  . Tramadol Other (See Comments) 01/12/2013    Review of Systems:    All systems reviewed and negative except where noted in HPI.   Physical Exam:  Vital signs in last 24 hours: Temp:  [98.1 F (36.7 C)] 98.1 F (36.7 C) (02/03 1401) Pulse Rate:  [70-95] 70 (02/03 1445) Resp:  [16] 16 (02/03 1445) BP: (123-126)/(71-82) 123/71 (02/03 1445) SpO2:  [100 %] 100 % (02/03 1445) Weight:  [69.4 kg] 69.4 kg (02/03 1402)   General:   Pleasant, cooperative in NAD Head:  Normocephalic and atraumatic. Eyes:   No icterus.   Conjunctiva pink. PERRLA. Ears:  Normal auditory acuity. Neck:  Supple; no masses or thyroidomegaly Lungs: Respirations even and unlabored. Lungs clear to auscultation bilaterally.   No wheezes, crackles, or rhonchi.  Heart:  Regular rate and rhythm;  Without murmur, clicks, rubs or gallops Abdomen:  Soft, nondistended, nontender. Normal bowel sounds. No appreciable masses or hepatomegaly.  No rebound or guarding.  Rectal:  Not performed. Msk:  Symmetrical without gross deformities.    Extremities:  Without edema, cyanosis or clubbing. Neurologic:  Alert and oriented x3;  grossly normal neurologically. Skin:  Intact without significant lesions or rashes. Cervical Nodes:  No  significant cervical adenopathy. Psych:  Alert and cooperative. Normal affect.  LAB RESULTS: Recent Labs    03/07/20 1405  WBC 6.5  HGB 8.6*  HCT 28.1*  PLT 171   BMET Recent Labs    03/07/20 1405  NA 138  K 4.7  CL 107  CO2 22  GLUCOSE 125*  BUN 42*  CREATININE 2.51*  CALCIUM 8.5*   LFT Recent Labs    03/07/20 1405  PROT 6.3*  ALBUMIN 3.0*  AST 23  ALT 17  ALKPHOS 88  BILITOT 0.4   PT/INR No results for input(s): LABPROT, INR in the last 72 hours.  STUDIES: No results found.    Impression / Plan:   Assessment: Active Problems:   Acute upper GI bleed   [name] [name] is a 50 y.o. y/o female with Who has a history of GI bleed without any source found on a double balloon enteroscopy.  The patient was restarted on anticoagulation therapy and came in with feeling weak and tired with a drop in hemoglobin from 10.6 on January 18 to 8.6 today.  The patient does report some diffuse abdominal pain.  She denies being on any anti-inflammatory medications.   Plan:  The patient will be set up for repeat bleeding scan and if negative the patient will be set up for an upper endoscopy for tomorrow to look for source of GI bleeding.  The patient has been explained the plan and agrees with it.  Thank you for involving me in the care of this patient.      LOS: 0 days   Kendra Lame, MD, [facility] 03/07/2020, 4:33 PM,  Pager [phone] 7am-5pm  Check AMION for 5pm -7am coverage and on weekends   Note: This dictation was prepared with Dragon dictation along with smaller phrase technology. Any transcriptional errors that result from this process are unintentional.

## 2020-03-07 NOTE — ED Triage Notes (Signed)
Pt states that she is here for black stools, this began Saturday.  Pt was recently hospitalized for GI bleeding and told to come to the hospital if it occurred again.

## 2020-03-07 NOTE — ED Provider Notes (Signed)
[facility] Emergency Department Provider Note   ____________________________________________   Event Date/Time   First MD Initiated Contact with Patient 03/07/20 1414     (approximate)  I have reviewed the triage vital signs and the nursing notes.   HISTORY  Chief Complaint Rectal Bleeding    HPI [name] is a 50 y.o. female with past medical history of hypertension, hyperlipidemia, diabetes, stroke, CKD, CAD, Roux-en-Y gastric bypass, diastolic CHF, PE, and paroxysmal SVT who presents to the ED for bloody stools.  Patient reports that since Saturday (4 days ago) she has noticed dark stools multiple times per day.  She stopped her Plavix as soon as she noticed this, but continues to take a daily baby aspirin.  Since the start of dark black stools, she has been feeling increasingly weak and fatigued, but she denies any chest pain or shortness of breath.  She spoke with the office of her GI doctor, who recommended she come to the ED for further evaluation.  She states symptoms are very similar to when she was admitted for the end of December.  At that time, she was found to have bleeding from her excluded stomach based off of bleeding scan, subsequently transferred to [facility] in order to have double bubble enteroscopy performed.  By the time this enteroscopy was performed, no source of bleeding was identified.        Past Medical History:  Diagnosis Date  . Acute colitis 01/27/2017  . Acute pyelonephritis   . Anemia    iron deficiency anemia  . Aortic arch aneurysm ([place])   . BRCA negative 2014  . CAD (coronary artery disease)    a. 08/2003 Cath: LAD 30-40-med Rx; b. 11/2014 PCI: LAD 10m(3.25x23 Xience Alpine DES); c. 06/2015 PCI: D1 (2.25x12 Resolute Integrity DES); d. 06/2017 PCI: Patent mLAD stent, D2 95 (PTCA); e. 09/2017 PCI: D2 99ost (CBA); d. 12/2017 Cath: LM nl, LAD 364m80d (small), D1 40ost, D2 95ost, LCX 40p, RCA 40ost/p->Med rx for D2 given  restenosis.  . Colitis 06/03/2015  . Colon polyp   . CVA (cerebral vascular accident) (HCDevol   Left side weakness.   . Degenerative tear of glenoid labrum of right shoulder 03/15/2017  . Diabetes mellitus without complication (HCMuskegon  . Family history of breast cancer    BRCA neg 2014  . Femur fracture, left (HCMeadowlands9/10/2018  . Gastric ulcer 04/27/2011  . History of echocardiogram    a. 03/2017 Echo: EF 60-65%, no rwma; b. 02/2018 Echo: EF 60-65%, no rwma. Nl RV fxn. No cardiac source of emboli (admitted w/ stroke).  . Hypertension   . Malignant melanoma of skin of scalp (HCRio Blanco  . MI, acute, non ST segment elevation (HCSheridan  . Neuromuscular disorder (HCManassas  . S/P drug eluting coronary stent placement 06/04/2015  . Sepsis (HCDarlington2/14/2019  . Stroke ([facility]   a. 02/2018 MRI: 15m36mate acute/early subacute L medial frontal lobe inarct; b. 02/2018 MRA No large vessel occlusion or aneurysm. Mod to sev L P2 stenosis. thready L vertebral artery, diffusely dzs'd; c. 02/2018 Carotid U/S: <50% bilat ICA dzs.    Patient Active Problem List   Diagnosis Date Noted  . Near syncope 01/25/2020  . Diabetes mellitus (HCCKermit2/23/2021  . Dehydration 01/25/2020  . Acute upper GI bleed 01/25/2020  . Pain in limb 01/05/2020  . Pulmonary embolism (HCCOceano0/06/2019  . Chest pain 11/05/2019  . Type II diabetes mellitus  with renal manifestations ([place]) 11/05/2019  . Acute renal failure superimposed on stage 3a chronic kidney disease ([place]) 09/02/2019  . Hypoglycemia   . Family history of breast cancer 05/23/2019  . Family history of colon cancer 05/23/2019  . Abnormal LFTs 04/18/2019  . Esophageal dysphagia   . Gastrointestinal tract imaging abnormality   . Trochanteric bursitis, left hip 03/17/2019  . Malignant neoplasm of vulva, unspecified ([place]) 03/10/2019  . Peripheral vascular disease, unspecified ([place]) 03/10/2019  . Paroxysmal supraventricular tachycardia ([place]) 03/10/2019  . Contusion of left hip 03/06/2019  .  Chest pain, moderate coronary artery risk 01/27/2019  . Melena   . Recurrent chest pain 01/19/2019  . Depression, major, single episode, moderate ([place]) 12/09/2018  . Closed nondisplaced intertrochanteric fracture of left femur ([place]) 10/12/2018  . Hypotension 06/26/2018  . Autonomic neuropathy 03/24/2018  . Acute delirium 03/03/2018  . H/O gastric bypass 03/02/2018  . Hypertension associated with stage 3 chronic kidney disease due to type 2 diabetes mellitus ([place]) 02/20/2018  . SI (sacroiliac) joint dysfunction 12/02/2017  . Unstable angina ([place]) 06/24/2017  . Syncope 04/09/2017  . Insomnia 03/18/2017  . Ischemic cardiomyopathy   . Arthritis   . Anxiety   . Tendinitis of upper biceps tendon of right shoulder 03/16/2017  . Gastroenteritis 01/27/2017  . Hx of colonic polyps   . H/O medication noncompliance 12/14/2015  . CAD (coronary artery disease)   . Hypertensive heart disease   . Status post bariatric surgery 06/04/2015  . Carotid stenosis 04/30/2015  . Stable angina pectoris ([place]) 04/17/2015  . Iron deficiency anemia 03/22/2015  . Vitamin B12 deficiency 02/18/2015  . Misuse of medications for pain 02/18/2015  . Major depressive disorder, recurrent, severe with psychotic features ([place]) 02/15/2015  . Helicobacter pylori infection 11/23/2014  . Malignant melanoma ([place]) 08/25/2014  . Chronic systolic CHF (congestive heart failure) ([place])   . Incomplete bladder emptying 07/12/2014  . Hypothyroidism 12/30/2013  . Aberrant subclavian artery 11/17/2013  . Multiple sclerosis ([place]) 11/02/2013  . History of CVA with residual deficit 06/20/2013  . Headache, migraine 05/29/2013  . Hyperlipidemia   . GERD (gastroesophageal reflux disease)   . Neuropathy ([place]) 01/02/2011  . Stroke ([place]) 06/21/2008  . Depression with anxiety 05/01/2008  . Essential hypertension 05/01/2008    Past Surgical History:  Procedure Laterality Date  . APPENDECTOMY    . BALLOON ENTEROSCOPY  02/06/2020    [facility]  . CARDIAC CATHETERIZATION N/A 11/09/2014   Procedure: Coronary Angiography;  Surgeon: Minna Merritts, MD;  Location: [place] CV LAB;  Service: Cardiovascular;  Laterality: N/A;  . CARDIAC CATHETERIZATION N/A 11/12/2014   Procedure: Coronary Stent Intervention;  Surgeon: Isaias Cowman, MD;  Location: [place] CV LAB;  Service: Cardiovascular;  Laterality: N/A;  . CARDIAC CATHETERIZATION N/A 04/18/2015   Procedure: Left Heart Cath and Coronary Angiography;  Surgeon: Minna Merritts, MD;  Location: [place] CV LAB;  Service: Cardiovascular;  Laterality: N/A;  . CARDIAC CATHETERIZATION Left 06/04/2015   Procedure: Left Heart Cath and Coronary Angiography;  Surgeon: Wellington Hampshire, MD;  Location: [place] CV LAB;  Service: Cardiovascular;  Laterality: Left;  . CARDIAC CATHETERIZATION N/A 06/04/2015   Procedure: Coronary Stent Intervention;  Surgeon: Wellington Hampshire, MD;  Location: [place] CV LAB;  Service: Cardiovascular;  Laterality: N/A;  . CESAREAN SECTION  2001  . CHOLECYSTECTOMY N/A 11/18/2016   Procedure: LAPAROSCOPIC CHOLECYSTECTOMY WITH INTRAOPERATIVE CHOLANGIOGRAM;  Surgeon: Christene Lye, MD;  Location: [facility] ORS;  Service: General;  Laterality: N/A;  . COLONOSCOPY WITH PROPOFOL N/A 04/27/2016   Procedure: COLONOSCOPY WITH PROPOFOL;  Surgeon: [name], MD;  Location: [place];  Service: Endoscopy;  Laterality: N/A;  . COLONOSCOPY WITH PROPOFOL N/A 01/12/2018   Procedure: COLONOSCOPY WITH PROPOFOL;  Surgeon: Tol[place], Benay Pike, MD;  Location: [facility] ENDOSCOPY;  Service: Endoscopy;  Laterality: N/A;  . CORONARY ANGIOPLASTY    . CORONARY BALLOON ANGIOPLASTY N/A 06/29/2017   Procedure: CORONARY BALLOON ANGIOPLASTY;  Surgeon: Wellington Hampshire, MD;  Location: [place] CV LAB;  Service: Cardiovascular;  Laterality: N/A;  . CORONARY BALLOON ANGIOPLASTY N/A 09/20/2017   Procedure: CORONARY BALLOON ANGIOPLASTY;  Surgeon: Wellington Hampshire,  MD;  Location: [place] CV LAB;  Service: Cardiovascular;  Laterality: N/A;  . CORONARY STENT INTERVENTION N/A 12/13/2019   Procedure: CORONARY STENT INTERVENTION;  Surgeon: Wellington Hampshire, MD;  Location: [place] CV LAB;  Service: Cardiovascular;  Laterality: N/A;  . DILATION AND CURETTAGE OF UTERUS    . ESOPHAGOGASTRODUODENOSCOPY (EGD) WITH PROPOFOL N/A 09/14/2014   Procedure: ESOPHAGOGASTRODUODENOSCOPY (EGD) WITH PROPOFOL;  Surgeon: Josefine Class, MD;  Location: [facility] ENDOSCOPY;  Service: Endoscopy;  Laterality: N/A;  . ESOPHAGOGASTRODUODENOSCOPY (EGD) WITH PROPOFOL N/A 04/27/2016   Procedure: ESOPHAGOGASTRODUODENOSCOPY (EGD) WITH PROPOFOL;  Surgeon: [name], MD;  Location: [place];  Service: Endoscopy;  Laterality: N/A;  Diabetic - oral meds  . ESOPHAGOGASTRODUODENOSCOPY (EGD) WITH PROPOFOL N/A 01/12/2018   Procedure: ESOPHAGOGASTRODUODENOSCOPY (EGD) WITH PROPOFOL;  Surgeon: Tol[place], Benay Pike, MD;  Location: [facility] ENDOSCOPY;  Service: Endoscopy;  Laterality: N/A;  . ESOPHAGOGASTRODUODENOSCOPY (EGD) WITH PROPOFOL N/A 04/11/2019   Procedure: ESOPHAGOGASTRODUODENOSCOPY (EGD) WITH PROPOFOL;  Surgeon: [name], MD;  Location: [facility] ENDOSCOPY;  Service: Endoscopy;  Laterality: N/A;  . GASTRIC BYPASS  09/2009   [place] (IM) NAIL INTERTROCHANTERIC Left 10/13/2018   Procedure: INTRAMEDULLARY (IM) NAIL INTERTROCHANTRIC;  Surgeon: Leandrew Koyanagi, MD;  Location: [place];  Service: Orthopedics;  Laterality: Left;  . Left Carotid to sublcavian artery bypass w/ subclavian artery ligation     a. Performed @ [place].  . LEFT HEART CATH AND CORONARY ANGIOGRAPHY Left 06/29/2017   Procedure: LEFT HEART CATH AND CORONARY ANGIOGRAPHY;  Surgeon: Wellington Hampshire, MD;  Location: [place] CV LAB;  Service: Cardiovascular;  Laterality: Left;  . LEFT HEART CATH AND CORONARY ANGIOGRAPHY N/A 09/20/2017   Procedure: LEFT HEART CATH AND CORONARY  ANGIOGRAPHY;  Surgeon: Wellington Hampshire, MD;  Location: [place] CV LAB;  Service: Cardiovascular;  Laterality: N/A;  . LEFT HEART CATH AND CORONARY ANGIOGRAPHY N/A 12/20/2017   Procedure: LEFT HEART CATH AND CORONARY ANGIOGRAPHY;  Surgeon: Wellington Hampshire, MD;  Location: [place] CV LAB;  Service: Cardiovascular;  Laterality: N/A;  . LEFT HEART CATH AND CORONARY ANGIOGRAPHY N/A 04/20/2019   Procedure: LEFT HEART CATH AND CORONARY ANGIOGRAPHY possible PCI;  Surgeon: Minna Merritts, MD;  Location: [place] CV LAB;  Service: Cardiovascular;  Laterality: N/A;  . LEFT HEART CATH AND CORONARY ANGIOGRAPHY N/A 12/13/2019   Procedure: LEFT HEART CATH AND CORONARY ANGIOGRAPHY;  Surgeon: Wellington Hampshire, MD;  Location: [place] CV LAB;  Service: Cardiovascular;  Laterality: N/A;  . MELANOMA EXCISION  2016   Dr. Evorn Gong  . [place]  2002  . RIGHT OOPHORECTOMY    . SHOULDER ARTHROSCOPY WITH OPEN ROTATOR CUFF REPAIR Right 01/07/2016   Procedure: SHOULDER ARTHROSCOPY WITH DEBRIDMENT, SUBACHROMIAL DECOMPRESSION;  Surgeon: Corky Mull, MD;  Location: [facility]  ORS;  Service: Orthopedics;  Laterality: Right;  . SHOULDER ARTHROSCOPY WITH OPEN ROTATOR CUFF REPAIR Right 03/16/2017   Procedure: SHOULDER ARTHROSCOPY WITH OPEN ROTATOR CUFF REPAIR POSSIBLE BICEPS TENODESIS;  Surgeon: Corky Mull, MD;  Location: [facility] ORS;  Service: Orthopedics;  Laterality: Right;  . TRIGGER FINGER RELEASE Right     [place]    Prior to Admission medications   Medication Sig Start Date End Date Taking? Authorizing Provider  ALPRAZolam Duanne Moron) 1 MG tablet Take 1 tablet by mouth twice daily as needed 02/16/20   [name], MD  amLODipine (NORVASC) 2.5 MG tablet Take 1 tablet (2.5 mg total) by mouth daily. 10/25/19   Minna Merritts, MD  buPROPion (WELLBUTRIN SR) 200 MG 12 hr tablet Take 200 mg by mouth every morning. 11/17/19   [provider]  carvedilol (COREG) 25 MG tablet Take 1  tablet (25 mg total) by mouth 2 (two) times daily with a meal. 02/27/20   Gollan, Kathlene November, MD  desvenlafaxine (PRISTIQ) 50 MG 24 hr tablet Take 50 mg by mouth daily. 12/11/19   [provider]  diphenoxylate-atropine (LOMOTIL) 2.5-0.025 MG tablet Take 1 tablet by mouth 4 (four) times daily as needed for diarrhea or loose stools. 01/29/20   [name], MD  EUTHYROX 25 MCG tablet TAKE 1 TABLET BY MOUTH ONCE DAILY BEFORE BREAKFAST 12/02/19   [name], MD  famotidine (PEPCID) 20 MG tablet Take 20 mg by mouth 2 (two) times daily.    [provider]  gabapentin (NEURONTIN) 300 MG capsule Take 2 capsules (600 mg total) by mouth 2 (two) times daily. 01/17/20   [name], MD  isosorbide mononitrate (IMDUR) 120 MG 24 hr tablet Take 1 tablet (120 mg total) by mouth daily. 10/21/19   Dwyane Dee, MD  nitroGLYCERIN (NITROSTAT) 0.4 MG SL tablet Place 1 tablet (0.4 mg total) under the tongue every 5 (five) minutes as needed for chest pain. 10/20/19   Dwyane Dee, MD  pantoprazole (PROTONIX) 40 MG tablet Take 1 tablet by mouth twice daily 06/06/19   [name], MD  Polyethyl Glycol-Propyl Glycol (SYSTANE OP) Place 1 drop into both eyes daily as needed (dry eyes).    [provider]  promethazine (PHENERGAN) 25 MG tablet TAKE 1 TABLET BY MOUTH EVERY 6 HOURS AS NEEDED FOR NAUSEA FOR VOMITING 02/07/20   [name], MD  rosuvastatin (CRESTOR) 40 MG tablet Take 1 tablet (40 mg total) by mouth daily at 6 PM. 10/25/19   [name], Kathlene November, MD  traZODone (DESYREL) 100 MG tablet TAKE 1 TO 2 TABLETS BY MOUTH AT BEDTIME 11/15/19   [name], MD  UBRELVY 100 MG TABS Take 100 mg by mouth 2 (two) times daily as needed (migraine).  09/13/19   [provider]    Allergies Lipitor [atorvastatin] and Tramadol  Family History  Problem Relation Age of Onset  . Hypertension Mother   . Anxiety disorder Mother   . Depression Mother   . Bipolar disorder Mother    . Heart disease Mother        No details  . Hyperlipidemia Mother   . Kidney disease Father   . Heart disease Father 70  . Hypertension Father   . Diabetes Father   . Stroke Father   . Colon cancer Father        dx in his 72's  . Anxiety disorder Father   . Depression Father   . Skin cancer  Father   . Kidney disease Sister   . Thyroid nodules Sister   . Hypertension Sister   . Hypertension Sister   . Diabetes Sister   . Hyperlipidemia Sister   . Depression Sister   . Breast cancer Maternal Aunt 59  . Breast cancer Maternal Aunt 39  . Ovarian cancer Cousin   . Colon cancer Cousin   . Kidney cancer Cousin   . Breast cancer Other   . Bladder Cancer Neg Hx     Social History Social History   Tobacco Use  . Smoking status: Former Smoker    Packs/day: 0.25    Years: 1.00    Pack years: 0.25    Types: Cigarettes    Quit date: 08/31/1994    Years since quitting: 25.5  . Smokeless tobacco: Never Used  . Tobacco comment: quit 28 years ago  Vaping Use  . Vaping Use: Never used  Substance Use Topics  . Alcohol use: No    Alcohol/week: 0.0 standard drinks  . Drug use: No    Review of Systems  Constitutional: No fever/chills.  Positive for weakness and fatigue. Eyes: No visual changes. ENT: No sore throat. Cardiovascular: Denies chest pain. Respiratory: Denies shortness of breath. Gastrointestinal: No abdominal pain.  No nausea, no vomiting.  No diarrhea.  No constipation.  Positive for melena. Genitourinary: Negative for dysuria. Musculoskeletal: Negative for back pain. Skin: Negative for rash. Neurological: Negative for headaches, focal weakness or numbness.  ____________________________________________   PHYSICAL EXAM:  VITAL SIGNS: ED Triage Vitals  Enc Vitals Group     BP 03/07/20 1401 126/82     Pulse Rate 03/07/20 1401 95     Resp 03/07/20 1401 16     Temp 03/07/20 1401 98.1 F (36.7 C)     Temp Source 03/07/20 1401 Oral     SpO2 03/07/20 1401  100 %     Weight 03/07/20 1402 153 lb (69.4 kg)     Height --      Head Circumference --      Peak Flow --      Pain Score 03/07/20 1402 9     Pain Loc --      Pain Edu? --      Excl. in [place]? --     Constitutional: Alert and oriented. Eyes: Conjunctivae are normal. Head: Atraumatic. Nose: No congestion/rhinnorhea. Mouth/Throat: Mucous membranes are moist. Neck: Normal ROM Cardiovascular: Normal rate, regular rhythm. Grossly normal heart sounds. Respiratory: Normal respiratory effort.  No retractions. Lungs CTAB. Gastrointestinal: Soft and nontender. No distention.  Rectal exam with dark melanotic, guaiac positive stool. Genitourinary: deferred Musculoskeletal: No lower extremity tenderness nor edema. Neurologic:  Normal speech and language. No gross focal neurologic deficits are appreciated. Skin:  Skin is warm, dry and intact. No rash noted. Psychiatric: Mood and affect are normal. Speech and behavior are normal.  ____________________________________________   LABS (all labs ordered are listed, but only abnormal results are displayed)  Labs Reviewed  COMPREHENSIVE METABOLIC PANEL - Abnormal; Notable for the following components:      Result Value   Glucose, Bld 125 (*)    BUN 42 (*)    Creatinine, Ser 2.51 (*)    Calcium 8.5 (*)    Total Protein 6.3 (*)    Albumin 3.0 (*)    GFR, Estimated 23 (*)    All other components within normal limits  CBC - Abnormal; Notable for the following components:   RBC 3.26 (*)  Hemoglobin 8.6 (*)    HCT 28.1 (*)    All other components within normal limits  POC OCCULT BLOOD, ED - Abnormal; Notable for the following components:   Fecal Occult Bld Positive (*)    All other components within normal limits  SARS CORONAVIRUS 2 BY RT PCR (HOSPITAL ORDER, [place] [facility])  LIPASE, BLOOD  TYPE AND SCREEN    PROCEDURES  Procedure(s) performed (including Critical  Care):  Procedures   ____________________________________________   INITIAL IMPRESSION / ASSESSMENT AND PLAN / ED COURSE       50 year old female with possible history of hypertension, hyperlipidemia, diabetes, stroke, CKD, CAD, Roux-en-Y gastric bypass, CHF, PE, and paroxysmal SVT who presents to the ED for black stools with increasing weakness and fatigue over the past couple of days.  Symptoms are similar to when she was admitted about 1 month ago for upper GI bleed.  Bleeding scan at that time was concerning for blood loss from her excluded stomach and she required transfer to [facility] for specialized enteroscopy.  Enteroscopy did not end up revealing source of her GI bleed and she was restarted on Plavix.  She now stopped the Plavix a couple of days ago and we will not need to reverse her for now.  It does appear that her hemoglobin is downtrending, although it remains greater than seven and we will hold off on blood transfusion.  Case discussed with Dr. Allen Norris of GI, who recommends repeat bleeding scan to check if blood loss again appears to be from the area of her excluded stomach.  If this is again the case, patient may end up requiring transfer to [facility] but for now she can be admitted to [facility] per GI.  Case discussed with hospitalist for admission, patient given dose of IV Protonix and hydrated with IV fluids.      ____________________________________________   FINAL CLINICAL IMPRESSION(S) / ED DIAGNOSES  Final diagnoses:  Upper GI bleed  History of Roux-en-Y gastric bypass     ED Discharge Orders    None       Note:  This document was prepared using Dragon voice recognition software and may include unintentional dictation errors.   Blake Divine, MD 03/07/20 1540

## 2020-03-08 ENCOUNTER — Inpatient Hospital Stay: Payer: Medicare Other | Admitting: Anesthesiology

## 2020-03-08 ENCOUNTER — Encounter
Admission: EM | Disposition: A | Discharge status: Home / Self Care | Payer: Self-pay | Source: Home / Self Care | Attending: Hospitalist

## 2020-03-08 ENCOUNTER — Encounter: Payer: Self-pay | Admitting: Internal Medicine

## 2020-03-08 ENCOUNTER — Inpatient Hospital Stay: Payer: Medicare Other

## 2020-03-08 DIAGNOSIS — D500 Iron deficiency anemia secondary to blood loss (chronic): Secondary | ICD-10-CM

## 2020-03-08 DIAGNOSIS — K922 Gastrointestinal hemorrhage, unspecified: Secondary | ICD-10-CM | POA: Diagnosis not present

## 2020-03-08 HISTORY — PX: ESOPHAGOGASTRODUODENOSCOPY (EGD) WITH PROPOFOL: SHX5813

## 2020-03-08 LAB — COMPREHENSIVE METABOLIC PANEL
ALT: 17 U/L (ref 0–44)
AST: 18 U/L (ref 15–41)
Albumin: 2.7 g/dL — ABNORMAL LOW (ref 3.5–5.0)
Alkaline Phosphatase: 82 U/L (ref 38–126)
Anion gap: 8 (ref 5–15)
BUN: 37 mg/dL — ABNORMAL HIGH (ref 6–20)
CO2: 22 mmol/L (ref 22–32)
Calcium: 8.5 mg/dL — ABNORMAL LOW (ref 8.9–10.3)
Chloride: 109 mmol/L (ref 98–111)
Creatinine, Ser: 2.28 mg/dL — ABNORMAL HIGH (ref 0.44–1.00)
GFR, Estimated: 25 mL/min — ABNORMAL LOW (ref >60)
Glucose, Bld: 66 mg/dL — ABNORMAL LOW (ref 70–99)
Potassium: 6.1 mmol/L — ABNORMAL HIGH (ref 3.5–5.1)
Sodium: 139 mmol/L (ref 135–145)
Total Bilirubin: 0.4 mg/dL (ref 0.3–1.2)
Total Protein: 5.7 g/dL — ABNORMAL LOW (ref 6.5–8.1)

## 2020-03-08 LAB — CBC
HCT: 24.4 % — ABNORMAL LOW (ref 36.0–46.0)
Hemoglobin: 7.7 g/dL — ABNORMAL LOW (ref 12.0–15.0)
MCH: 26.8 pg (ref 26.0–34.0)
MCHC: 31.6 g/dL (ref 30.0–36.0)
MCV: 85 fL (ref 80.0–100.0)
Platelets: 158 10*3/uL (ref 150–400)
RBC: 2.87 MIL/uL — ABNORMAL LOW (ref 3.87–5.11)
RDW: 14.2 % (ref 11.5–15.5)
WBC: 5.4 10*3/uL (ref 4.0–10.5)
nRBC: 0 % (ref 0.0–0.2)

## 2020-03-08 LAB — CBG MONITORING, ED: Glucose-Capillary: 84 mg/dL (ref 70–99)

## 2020-03-08 LAB — HEMOGLOBIN AND HEMATOCRIT, BLOOD
HCT: 28 % — ABNORMAL LOW (ref 36.0–46.0)
Hemoglobin: 8.9 g/dL — ABNORMAL LOW (ref 12.0–15.0)

## 2020-03-08 LAB — MAGNESIUM: Magnesium: 1.7 mg/dL (ref 1.7–2.4)

## 2020-03-08 LAB — POTASSIUM: Potassium: 5 mmol/L (ref 3.5–5.1)

## 2020-03-08 LAB — PREPARE RBC (CROSSMATCH)

## 2020-03-08 LAB — PROTIME-INR
INR: 1.1 (ref 0.8–1.2)
Prothrombin Time: 13.5 seconds (ref 11.4–15.2)

## 2020-03-08 SURGERY — ESOPHAGOGASTRODUODENOSCOPY (EGD) WITH PROPOFOL
Anesthesia: General

## 2020-03-08 MED ORDER — LEVOTHYROXINE SODIUM 25 MCG PO TABS
25.0000 ug | ORAL_TABLET | Freq: Every day | ORAL | Status: DC
  Administered 2020-03-09: 25 ug via ORAL
  Filled 2020-03-08: qty 1

## 2020-03-08 MED ORDER — LIDOCAINE HCL (CARDIAC) PF 100 MG/5ML IV SOSY
PREFILLED_SYRINGE | INTRAVENOUS | Status: DC | PRN
  Administered 2020-03-08: 50 mg via INTRAVENOUS

## 2020-03-08 MED ORDER — ALPRAZOLAM 0.5 MG PO TABS: 1.0000 mg | ORAL_TABLET | Freq: Two times a day (BID) | ORAL | Status: DC | PRN

## 2020-03-08 MED ORDER — SODIUM CHLORIDE 0.9 % IV SOLN: 10.0000 mL/h | Freq: Once | INTRAVENOUS | Status: DC

## 2020-03-08 MED ORDER — ONDANSETRON HCL 4 MG/2ML IJ SOLN
INTRAMUSCULAR | Status: AC
  Administered 2020-03-08: 4 mg
  Filled 2020-03-08: qty 2

## 2020-03-08 MED ORDER — CARVEDILOL 25 MG PO TABS
25.0000 mg | ORAL_TABLET | Freq: Two times a day (BID) | ORAL | Status: DC
  Administered 2020-03-08 – 2020-03-09 (×2): 25 mg via ORAL
  Filled 2020-03-08 (×2): qty 1

## 2020-03-08 MED ORDER — LACTATED RINGERS IV SOLN: INTRAVENOUS | Status: AC

## 2020-03-08 MED ORDER — SODIUM CHLORIDE 0.9 % IV SOLN: INTRAVENOUS | Status: DC

## 2020-03-08 MED ORDER — ROSUVASTATIN CALCIUM 10 MG PO TABS: 40.0000 mg | ORAL_TABLET | Freq: Every day | ORAL | Status: DC

## 2020-03-08 MED ORDER — PROPOFOL 10 MG/ML IV BOLUS
INTRAVENOUS | Status: DC | PRN
  Administered 2020-03-08: 20 mg via INTRAVENOUS
  Administered 2020-03-08: 50 mg via INTRAVENOUS
  Administered 2020-03-08: 20 mg via INTRAVENOUS

## 2020-03-08 MED ORDER — MAGNESIUM SULFATE IN D5W 1-5 GM/100ML-% IV SOLN
1.0000 g | Freq: Once | INTRAVENOUS | Status: AC
  Administered 2020-03-08: 1 g via INTRAVENOUS
  Filled 2020-03-08 (×2): qty 100

## 2020-03-08 MED ORDER — TECHNETIUM TC 99M-LABELED RED BLOOD CELLS IV KIT
22.0150 | PACK | Freq: Once | INTRAVENOUS | Status: AC | PRN
  Administered 2020-03-08: 22.015 via INTRAVENOUS

## 2020-03-08 MED ORDER — ISOSORBIDE MONONITRATE ER 30 MG PO TB24
120.0000 mg | ORAL_TABLET | Freq: Every day | ORAL | Status: DC
  Administered 2020-03-09: 120 mg via ORAL
  Filled 2020-03-08: qty 4

## 2020-03-08 MED ORDER — TRAZODONE HCL 100 MG PO TABS
100.0000 mg | ORAL_TABLET | Freq: Every day | ORAL | Status: DC
  Administered 2020-03-08: 100 mg via ORAL
  Filled 2020-03-08: qty 1

## 2020-03-08 MED ORDER — AMLODIPINE BESYLATE 5 MG PO TABS
2.5000 mg | ORAL_TABLET | Freq: Every day | ORAL | Status: DC
  Administered 2020-03-08 – 2020-03-09 (×2): 2.5 mg via ORAL
  Filled 2020-03-08 (×2): qty 1

## 2020-03-08 MED ORDER — GABAPENTIN 300 MG PO CAPS
600.0000 mg | ORAL_CAPSULE | Freq: Two times a day (BID) | ORAL | Status: DC
  Administered 2020-03-08 – 2020-03-09 (×2): 600 mg via ORAL
  Filled 2020-03-08 (×2): qty 2

## 2020-03-08 MED ORDER — BUPROPION HCL ER (SR) 100 MG PO TB12
200.0000 mg | ORAL_TABLET | Freq: Every morning | ORAL | Status: DC
  Administered 2020-03-09: 200 mg via ORAL
  Filled 2020-03-08: qty 2

## 2020-03-08 NOTE — Progress Notes (Signed)
The patient did not have the bleeding scan recommended yesterday.  The patient had a evaluation at [facility] in the past with a double-balloon enteroscopy finding no source of her bleeding.  The patient did receive 1 unit of blood and corrected appropriately.  The patient will be brought to endoscopy for an upper endoscopy today to see if there is any cause of her GI bleeding that can be reached with a upper scope.  The patient has been explained the plan agrees with it.

## 2020-03-08 NOTE — ED Notes (Signed)
Blood bank called to inquire as to why ordered blood transfusion has not yet been released. Blood bank reports inability to see order for transfusion. RN contacting charge RN in effort to identify solution.

## 2020-03-08 NOTE — ED Notes (Signed)
This RN waiting for blood bank to call once RBCs are ready.

## 2020-03-08 NOTE — Anesthesia Procedure Notes (Signed)
Procedure Name: MAC Date/Time: 03/08/2020 11:40 AM Performed by: Lily Peer, Susi Goslin, CRNA Pre-anesthesia Checklist: Patient identified, Emergency Drugs available, Suction available and Patient being monitored Patient Re-evaluated:Patient Re-evaluated prior to induction Oxygen Delivery Method: Nasal cannula

## 2020-03-08 NOTE — ED Notes (Signed)
Howerter, MD at bed side.

## 2020-03-08 NOTE — Progress Notes (Signed)
Called Nuclear Medicine to see if they are ready for patient to come for bleeding scan. They are agreeable. Patient has been placed into teletracking and is currently being transported to the nuclear medicine department. Patients primary RN was also called, given report and notified that the patient is being transported to nuclear medicine to have bleeding scan performed.

## 2020-03-08 NOTE — Op Note (Signed)
[facility] Gastroenterology Patient Name: [name] Procedure Date: 03/08/2020 11:31 AM MRN: [id number] Account #: [id number] Date of Birth: 11-May-1970 Admit Type: Outpatient Age: 50 Room: [facility] ENDO ROOM 1 Gender: Female Note Status: Finalized Procedure:             Upper GI endoscopy Indications:           Iron deficiency anemia secondary to chronic blood loss Providers:             [name] MD, MD Medicines:             Propofol per Anesthesia Complications:         No immediate complications. Procedure:             Pre-Anesthesia Assessment:                        - Prior to the procedure, a History and Physical was                         performed, and patient medications and allergies were                         reviewed. The patient's tolerance of previous                         anesthesia was also reviewed. The risks and benefits                         of the procedure and the sedation options and risks                         were discussed with the patient. All questions were                         answered, and informed consent was obtained. Prior                         Anticoagulants: The patient has taken no previous                         anticoagulant or antiplatelet agents. ASA Grade                         Assessment: II - A patient with mild systemic disease.                         After reviewing the risks and benefits, the patient                         was deemed in satisfactory condition to undergo the                         procedure.                        After obtaining informed consent, the endoscope was                         passed under direct vision. Throughout the procedure,  the patient's blood pressure, pulse, and oxygen                         saturations were monitored continuously. The Endoscope                         was introduced through the mouth, and advanced to the                          jejunum. The upper GI endoscopy was accomplished                         without difficulty. The patient tolerated the                         procedure well. Findings:      The examined esophagus was normal.      Evidence of a Roux-en-Y gastrojejunostomy was found. The gastrojejunal       anastomosis was characterized by ulceration. This was traversed.       Coagulation for hemostasis using argon plasma at 2 liters/minute and 30       watts was successful.      The examined jejunum was normal. Impression:            - Normal esophagus.                        - Roux-en-Y gastrojejunostomy with gastrojejunal                         anastomosis characterized by ulceration. Treated with                         argon plasma coagulation (APC).                        - Normal examined jejunum.                        - No specimens collected. Recommendation:        - Return patient to hospital ward for ongoing care.                        - Resume previous diet.                        - Continue present medications. Procedure Code(s):     --- Professional ---                        [phone], Esophagogastroduodenoscopy, flexible,                         transoral; with control of bleeding, any method Diagnosis Code(s):     --- Professional ---                        D50.0, Iron deficiency anemia secondary to blood loss                         (chronic) CPT copyright 2019 American Medical Association. All rights reserved. The  codes documented in this report are preliminary and upon coder review may  be revised to meet current compliance requirements. [name] MD, MD 03/08/2020 11:50:19 AM This report has been signed electronically. Number of Addenda: 0 Note Initiated On: 03/08/2020 11:31 AM Estimated Blood Loss:  Estimated blood loss: none.      [facility]

## 2020-03-08 NOTE — Anesthesia Preprocedure Evaluation (Signed)
Anesthesia Evaluation  Patient identified by MRN, date of birth, ID band Patient awake    Reviewed: Allergy & Precautions, NPO status , Patient's Chart, lab work & pertinent test results  History of Anesthesia Complications Negative for: history of anesthetic complications  Airway Mallampati: II  TM Distance: >3 FB Neck ROM: Full    Dental  (+) Edentulous Lower, Edentulous Upper   Pulmonary neg pulmonary ROS, neg sleep apnea, neg COPD, Patient abstained from smoking.Not current smoker, former smoker,    Pulmonary exam normal breath sounds clear to auscultation       Cardiovascular Exercise Tolerance: Good METShypertension, + angina with exertion + CAD, + Past MI, + Cardiac Stents, + Peripheral Vascular Disease and +CHF  (-) dysrhythmias (-) Valvular Problems/Murmurs Rhythm:Regular Rate:Normal - Systolic murmurs Recent hospitalization for chest pain/ hypertensive urgency. Started on lasix. Per follow up cardiologist note, patient is optimized for today's procedure with no further cardiac testing warranted.  Cardiac catheterization September 20, 2017 Significant one-vessel coronary artery disease with patent stents in the mid LAD and first diagonal.  significant chronic disease in the distal LAD close to the apex. Severe restenosis ostial second diagonal at the site of previous angioplasty in May 2019. Successful cutting balloon angioplasty of the ostial second diagonal without stent placement.   TTE 2020: Marland Kitchen Left ventricular ejection fraction, by visual estimation, is 60 to  65%. The left ventricle has normal function. There is no left ventricular  hypertrophy.  2. Left ventricular diastolic parameters are consistent with Grade I  diastolic dysfunction (impaired relaxation).  3. The left ventricle has no regional wall motion abnormalities.  4. Global right ventricle has normal systolic function.The right  ventricular size is  normal. No increase in right ventricular wall  thickness.  5. Left atrial size was normal.    Neuro/Psych  Headaches, neg Seizures PSYCHIATRIC DISORDERS Anxiety Depression CVA    GI/Hepatic PUD, GERD  Controlled,(+)     (-) substance abuse  ,   Endo/Other  diabetes, Type 2, Oral Hypoglycemic AgentsHypothyroidism   Renal/GU CRFRenal disease     Musculoskeletal   Abdominal   Peds  Hematology  (+) Blood dyscrasia, anemia ,   Anesthesia Other Findings Past Medical History: No date: Acute pyelonephritis No date: Anemia     Comment:  iron deficiency anemia No date: Aortic arch aneurysm ([place]) 2014: BRCA negative No date: CAD (coronary artery disease)     Comment:  a. 08/2003 Cath: LAD 30-40-med Rx; b. 11/2014 PCI: LAD               64m(3.25x23 Xience Alpine DES); c. 06/2015 PCI: D1               (2.25x12 Resolute Integrity DES); d. 06/2017 PCI: Patent               mLAD stent, D2 95 (PTCA); e. 09/2017 PCI: D2 99ost (CBA);               d. 12/2017 Cath: LM nl, LAD 371m80d (small), D1 40ost,               D2 95ost, LCX 40p, RCA 40ost/p->Med rx for D2 given               restenosis. No date: CKD (chronic kidney disease), stage III No date: Colon polyp No date: CVA (cerebral vascular accident) ([place]    Comment:  Left side weakness.  No date: Diabetes (HCOsageNo date: Family history of  breast cancer     Comment:  BRCA neg 2014 04/27/2011: Gastric ulcer No date: History of echocardiogram     Comment:  a. 03/2017 Echo: EF 60-65%, no rwma; b. 02/2018 Echo: EF               60-65%, no rwma. Nl RV fxn. No cardiac source of emboli               (admitted w/ stroke). No date: HTN (hypertension) No date: Hyperlipemia No date: Hypothyroid No date: Malignant melanoma of skin of scalp (HCC) No date: MI, acute, non ST segment elevation (HCC) No date: Neuromuscular disorder ([place]) No date: Orthostatic hypotension 06/04/2015: S/P drug eluting coronary stent placement 03/18/2017: Sepsis  ([place]) No date: Stroke [facility])     Comment:  a. 02/2018 MRI: 46m late acute/early subacute L medial               frontal lobe inarct; b. 02/2018 MRA No large vessel               occlusion or aneurysm. Mod to sev L P2 stenosis. thready               L vertebral artery, diffusely dzs'd; c. 02/2018 Carotid               U/S: <50% bilat ICA dzs.  Reproductive/Obstetrics                             Anesthesia Physical  Anesthesia Plan  ASA: III  Anesthesia Plan: General   Post-op Pain Management:    Induction: Intravenous  PONV Risk Score and Plan: 3 and Ondansetron, Propofol infusion and TIVA  Airway Management Planned: Nasal Cannula  Additional Equipment: None  Intra-op Plan:   Post-operative Plan:   Informed Consent: I have reviewed the patients History and Physical, chart, labs and discussed the procedure including the risks, benefits and alternatives for the proposed anesthesia with the patient or authorized representative who has indicated his/her understanding and acceptance.     Dental advisory given  Plan Discussed with: CRNA and Surgeon  Anesthesia Plan Comments: (Discussed risks of anesthesia with patient, including possibility of difficulty with spontaneous ventilation under anesthesia necessitating airway intervention, PONV, and rare risks such as cardiac or respiratory or neurological events. Patient understands. Patient counseled on being high risk for anesthetic. Patient was told about increased risk of cardiac and respiratory events, including death. Patient understands. )        Anesthesia Quick Evaluation

## 2020-03-08 NOTE — Plan of Care (Signed)
  Problem: Education: Goal: Knowledge of General Education information will improve Description: Including pain rating scale, medication(s)/side effects and non-pharmacologic comfort measures 03/08/2020 1320 by Cristela Blue, RN Outcome: Progressing 03/08/2020 1320 by Cristela Blue, RN Outcome: Progressing   Problem: Health Behavior/Discharge Planning: Goal: Ability to manage health-related needs will improve 03/08/2020 1320 by Cristela Blue, RN Outcome: Progressing 03/08/2020 1320 by Cristela Blue, RN Outcome: Progressing   Problem: Clinical Measurements: Goal: Ability to maintain clinical measurements within normal limits will improve 03/08/2020 1320 by Cristela Blue, RN Outcome: Progressing 03/08/2020 1320 by Cristela Blue, RN Outcome: Progressing Goal: Will remain free from infection 03/08/2020 1320 by Cristela Blue, RN Outcome: Progressing 03/08/2020 1320 by Cristela Blue, RN Outcome: Progressing Goal: Diagnostic test results will improve 03/08/2020 1320 by Cristela Blue, RN Outcome: Progressing 03/08/2020 1320 by Cristela Blue, RN Outcome: Progressing Goal: Respiratory complications will improve 03/08/2020 1320 by Cristela Blue, RN Outcome: Progressing 03/08/2020 1320 by Cristela Blue, RN Outcome: Progressing Goal: Cardiovascular complication will be avoided 03/08/2020 1320 by Cristela Blue, RN Outcome: Progressing 03/08/2020 1320 by Cristela Blue, RN Outcome: Progressing   Problem: Activity: Goal: Risk for activity intolerance will decrease 03/08/2020 1320 by Cristela Blue, RN Outcome: Progressing 03/08/2020 1320 by Cristela Blue, RN Outcome: Progressing   Problem: Nutrition: Goal: Adequate nutrition will be maintained 03/08/2020 1320 by Cristela Blue, RN Outcome: Progressing 03/08/2020 1320 by Cristela Blue, RN Outcome: Progressing   Problem: Coping: Goal: Level of anxiety will decrease 03/08/2020 1320 by Cristela Blue, RN Outcome: Progressing 03/08/2020 1320 by  Cristela Blue, RN Outcome: Progressing   Problem: Elimination: Goal: Will not experience complications related to bowel motility 03/08/2020 1320 by Cristela Blue, RN Outcome: Progressing 03/08/2020 1320 by Cristela Blue, RN Outcome: Progressing Goal: Will not experience complications related to urinary retention 03/08/2020 1320 by Cristela Blue, RN Outcome: Progressing 03/08/2020 1320 by Cristela Blue, RN Outcome: Progressing   Problem: Pain Managment: Goal: General experience of comfort will improve 03/08/2020 1320 by Cristela Blue, RN Outcome: Progressing 03/08/2020 1320 by Cristela Blue, RN Outcome: Progressing   Problem: Safety: Goal: Ability to remain free from injury will improve 03/08/2020 1320 by Cristela Blue, RN Outcome: Progressing 03/08/2020 1320 by Cristela Blue, RN Outcome: Progressing   Problem: Skin Integrity: Goal: Risk for impaired skin integrity will decrease 03/08/2020 1320 by Cristela Blue, RN Outcome: Progressing 03/08/2020 1320 by Cristela Blue, RN Outcome: Progressing

## 2020-03-08 NOTE — ED Notes (Signed)
Informed by bloodbank that crossmatch order wasn't initially inserted by provider. Order now in place and informed by bloodbank that unit is ready.

## 2020-03-08 NOTE — Anesthesia Postprocedure Evaluation (Signed)
Anesthesia Post Note  Patient: [name]  Procedure(s) Performed: ESOPHAGOGASTRODUODENOSCOPY (EGD) WITH PROPOFOL (N/A )  Patient location during evaluation: Endoscopy Anesthesia Type: General Level of consciousness: awake and alert Pain management: pain level controlled Vital Signs Assessment: post-procedure vital signs reviewed and stable Respiratory status: spontaneous breathing, nonlabored ventilation, respiratory function stable and patient connected to nasal cannula oxygen Cardiovascular status: blood pressure returned to baseline and stable Postop Assessment: no apparent nausea or vomiting Anesthetic complications: no   No complications documented.   Last Vitals:  Vitals:   03/08/20 1053 03/08/20 1152  BP: (!) 176/78   Pulse: 68   Resp: 13   Temp: 36.8 C 36.6 C  SpO2: 100%     Last Pain:  Vitals:   03/08/20 1222  TempSrc:   PainSc: 0-No pain                 Martha Clan

## 2020-03-08 NOTE — Progress Notes (Signed)
PROGRESS NOTE    [name]  LZJ:[id number] DOB: 1970/07/14 DOA: 03/07/2020 PCP: [name], MD    Assessment & Plan:   Principal Problem:   Acute upper GI bleed Active Problems:   Essential hypertension   Chronic systolic CHF (congestive heart failure) (HCC)   Melena   Acute on chronic anemia   AKI (acute kidney injury) ([place])   Iron deficiency anemia secondary to blood loss (chronic)   [name] is a 50 y.o. female with medical history significant for chronic diastolic heart failure, hypertension, peptic ulcers severe disease with gastric ulcer in 2013, type 2 diabetes mellitus with most recent hemoglobin A1c 5.0% when checked in December 2021, chronic anemia with baseline hemoglobin 10-11, GERD, stage IIIb chronic kidney disease with baseline creatinine 1.5-2.0, recent hospitalization upper gastrointestinal bleeding, Roux en Y, who is admitted to [facility] on 03/07/2020 with suspected acute upper gastrointestinal bleeding after presenting from home to [facility] Emergency Department complaining of dark-colored stool.    #) Acute Upper GI Bleed 2/2 gastric ulcer --diagnosis on the basis of 5 days of dark-colored stool, with elevation of BUN, fecal occult positive test performed in the ED today. --She is on dual antiplatelet therapy as an outpatient with daily baby aspirin as well as Plavix in the setting of PCI with stent placement in November 2021, although she has held her Plavix while continuing to take her aspirin since onset of the dark-colored stools 5 days ago, at the instruction of her cardiologist --a history of Roux-en-Y, with a similar presentation leading to hospitalization in December 2021 for further evaluation of acute upper gastrointestinal bleed, with ensuing nuclear medicine bleeding scan revealing evidence of bleeding at the level of the stomach prompting transfer to [facility] for endoscopic evaluation, which showed no evidence of  active bleed. --IV Protonix started Plan: --EGD today, found ulceration at gastrojejunal anastomosis, treated. --Monitor Hgb --cont IV PPI BID  #) Acute on chronic anemia:  --baseline hemoglobin 10-11, presenting hemoglobin found to be 8.6.   --anemia workup showed mild iron def Plan: --Monitor Hgb --will discharge on iron supplement  #) Acute kidney injury on CKD 3B The context of stage IIIb chronic kidney disease with baseline creatinine 1.5-2.0, presenting creatinine noted to be 2.51, which is relative to most recent prior value 1.4 on 02/20/2020.  Suspect that this is prerenal in nature in the setting of intravascular depletion as a result of acute on chronic anemia as a consequence of suspected acute upper GI bleed, as further described above. Plan: --LR@100  for 10 hours  #) Essential hypertension:  --home BP meds held on admission.  BP now trending up. Plan: --resume home amlodipine, coreg, Imdur  #) Type 2 diabetes mellitus:  Managed with lifestyle modifications.  Most recent hemoglobin A1c was found to be 5.0% on 01/25/2020 Plan: --no need for SSI  #) Hyperlipidemia --resume home statin  #) Chronic diastolic heart failure:  With most recent echocardiogram in October 2021 showing LVEF 60 to 65% and grade 1 diastolic dysfunction.  No clinical evidence to suggest decompensated heart failure at this time.    Hypomag --replete with IV mag   DVT prophylaxis: SCD/Compression stockings Code Status: Full code  Family Communication:  Status is: inpatient Dispo:   The patient is from: home Anticipated d/c is to: home Anticipated d/c date is: tomorrow Patient currently is not medically ready to d/c due to: AKI on IVF, need to monitor for stability of Hgb.  Subjective and Interval History:  Pt underwent EGD and an ulcer was found and treated. Post-procedure, pt denied abdominal pain.  No BM for the past day.  No N/V.  Feeling thirsty and ready to have oral  intake.   Objective: Vitals:   03/08/20 0747 03/08/20 0816 03/08/20 1053 03/08/20 1152  BP: 126/63 (!) 152/73 (!) 176/78   Pulse: 64 61 68   Resp: 16 16 13    Temp: 98.2 F (36.8 C) 98.2 F (36.8 C) 98.3 F (36.8 C) 97.9 F (36.6 C)  TempSrc: Oral Oral Temporal Temporal  SpO2: 100% 100% 100%   Weight:   69.4 kg   Height:   5\' 3"  (1.6 m)     Intake/Output Summary (Last 24 hours) at 03/08/2020 1858 Last data filed at 03/08/2020 1824 Gross per 24 hour  Intake 904.08 ml  Output --  Net 904.08 ml   Filed Weights   03/07/20 1402 03/08/20 1053  Weight: 69.4 kg 69.4 kg    Examination:   Constitutional: NAD, AAOx3 HEENT: conjunctivae and lids normal, EOMI CV: No cyanosis.   RESP: normal respiratory effort, on RA Extremities: No effusions, edema in BLE SKIN: warm, dry Neuro: II - XII grossly intact.   Psych: Normal mood and affect.  Appropriate judgement and reason   Data Reviewed: I have personally reviewed following labs and imaging studies  CBC: Recent Labs  Lab 03/07/20 1405 03/07/20 1713 03/07/20 2127 03/08/20 0410 03/08/20 0908  WBC 6.5  --   --  5.4  --   HGB 8.6* 8.2* 7.2* 7.7* 8.9*  HCT 28.1* 26.6* 23.0* 24.4* 28.0*  MCV 86.2  --   --  85.0  --   PLT 171  --   --  158  --    Basic Metabolic Panel: Recent Labs  Lab 03/07/20 1405 03/08/20 0410 03/08/20 0908  NA 138 139  --   K 4.7 6.1* 5.0  CL 107 109  --   CO2 22 22  --   GLUCOSE 125* 66*  --   BUN 42* 37*  --   CREATININE 2.51* 2.28*  --   CALCIUM 8.5* 8.5*  --   MG  --  1.7  --    GFR: Estimated Creatinine Clearance: 27.3 mL/min (A) (by C-G formula based on SCr of 2.28 mg/dL (H)). Liver Function Tests: Recent Labs  Lab 03/07/20 1405 03/08/20 0410  AST 23 18  ALT 17 17  ALKPHOS 88 82  BILITOT 0.4 0.4  PROT 6.3* 5.7*  ALBUMIN 3.0* 2.7*   Recent Labs  Lab 03/07/20 1405  LIPASE 26   No results for input(s): AMMONIA in the last 168 hours. Coagulation Profile: Recent Labs  Lab  03/08/20 0410  INR 1.1   Cardiac Enzymes: No results for input(s): CKTOTAL, CKMB, CKMBINDEX, TROPONINI in the last 168 hours. BNP (last 3 results) No results for input(s): PROBNP in the last 8760 hours. HbA1C: No results for input(s): HGBA1C in the last 72 hours. CBG: Recent Labs  Lab 03/08/20 0732  GLUCAP 84   Lipid Profile: No results for input(s): CHOL, HDL, LDLCALC, TRIG, CHOLHDL, LDLDIRECT in the last 72 hours. Thyroid Function Tests: No results for input(s): TSH, T4TOTAL, FREET4, T3FREE, THYROIDAB in the last 72 hours. Anemia Panel: Recent Labs    03/07/20 1713  FERRITIN 18  TIBC 245*  IRON 26*   Sepsis Labs: No results for input(s): PROCALCITON, LATICACIDVEN in the last 168 hours.  Recent Results (from the past 240 hour(s))  SARS Coronavirus 2 by RT PCR (hospital order, performed in [facility] hospital lab) Nasopharyngeal Nasopharyngeal Swab     Status: None   Collection Time: 03/07/20  2:23 PM   Specimen: Nasopharyngeal Swab  Result Value Ref Range Status   SARS Coronavirus 2 NEGATIVE NEGATIVE Final    Comment: (NOTE) SARS-CoV-2 target nucleic acids are NOT DETECTED.  The SARS-CoV-2 RNA is generally detectable in upper and lower respiratory specimens during the acute phase of infection. The lowest concentration of SARS-CoV-2 viral copies this assay can detect is 250 copies / mL. A negative result does not preclude SARS-CoV-2 infection and should not be used as the sole basis for treatment or other patient management decisions.  A negative result may occur with improper specimen collection / handling, submission of specimen other than nasopharyngeal swab, presence of viral mutation(s) within the areas targeted by this assay, and inadequate number of viral copies (<250 copies / mL). A negative result must be combined with clinical observations, patient history, and epidemiological information.  Fact Sheet for Patients:    StrictlyIdeas.no  Fact Sheet for Healthcare Providers: BankingDealers.co.za  This test is not yet approved or  cleared by the [place] FDA and has been authorized for detection and/or diagnosis of SARS-CoV-2 by FDA under an Emergency Use Authorization (EUA).  This EUA will remain in effect (meaning this test can be used) for the duration of the COVID-19 declaration under Section 564(b)(1) of the Act, 21 U.S.C. section 360bbb-3(b)(1), unless the authorization is terminated or revoked sooner.  Performed at [facility], [address] [place], [place] [address]       Radiology Studies: NM GI Blood Loss  Result Date: 03/08/2020 CLINICAL DATA:  GI bleed, post gastric bypass surgery 2011, patient states she is still bleeding and passing dark stool EXAM: NUCLEAR MEDICINE GASTROINTESTINAL BLEEDING SCAN TECHNIQUE: Sequential abdominal images were obtained following intravenous administration of Tc-61m labeled red blood cells. RADIOPHARMACEUTICALS:  22.015 mCi Tc-50m pertechnetate in-vitro UltraTag labeled red cells. COMPARISON:  01/25/2020 FINDINGS: Normal blood pool distribution of tracer. No abnormal gastrointestinal localization of tracer identified to suggest active GI bleeding. Small amount of the labeled tracer within urinary bladder. IMPRESSION: Negative GI bleeding scan. Electronically Signed   By: Lavonia Dana M.D.   On: 03/08/2020 16:51     Scheduled Meds: . pantoprazole (PROTONIX) IV  40 mg Intravenous Q12H   Continuous Infusions: . sodium chloride Stopped (03/08/20 0453)  . lactated ringers 100 mL/hr at 03/08/20 1650  . magnesium sulfate bolus IVPB 1 g (03/08/20 1821)     LOS: 1 day     Enzo Bi, MD [facility] If 7PM-7AM, please contact night-coverage 03/08/2020, 6:58 PM

## 2020-03-08 NOTE — ED Notes (Signed)
This RN assisted pt to the bathroom.

## 2020-03-08 NOTE — Transfer of Care (Signed)
Immediate Anesthesia Transfer of Care Note  Patient: [name]  Procedure(s) Performed: ESOPHAGOGASTRODUODENOSCOPY (EGD) WITH PROPOFOL (N/A )  Patient Location: PACU and Endoscopy Unit  Anesthesia Type:General  Level of Consciousness: drowsy  Airway & Oxygen Therapy: Patient Spontanous Breathing  Post-op Assessment: Report given to RN and Post -op Vital signs reviewed and stable  Post vital signs: Reviewed and stable  Last Vitals:  Vitals Value Taken Time  BP 154/74 03/08/20 1152  Temp    Pulse 63 03/08/20 1152  Resp 16 03/08/20 1152  SpO2 96 % 03/08/20 1152  Vitals shown include unvalidated device data.  Last Pain:  Vitals:   03/08/20 1053  TempSrc: Temporal  PainSc: 0-No pain         Complications: No complications documented.

## 2020-03-09 DIAGNOSIS — K922 Gastrointestinal hemorrhage, unspecified: Secondary | ICD-10-CM | POA: Diagnosis not present

## 2020-03-09 DIAGNOSIS — K253 Acute gastric ulcer without hemorrhage or perforation: Secondary | ICD-10-CM

## 2020-03-09 LAB — TYPE AND SCREEN
ABO/RH(D): O NEG
Antibody Screen: NEGATIVE
Unit division: 0

## 2020-03-09 LAB — BASIC METABOLIC PANEL
Anion gap: 7 (ref 5–15)
BUN: 29 mg/dL — ABNORMAL HIGH (ref 6–20)
CO2: 20 mmol/L — ABNORMAL LOW (ref 22–32)
Calcium: 8.3 mg/dL — ABNORMAL LOW (ref 8.9–10.3)
Chloride: 109 mmol/L (ref 98–111)
Creatinine, Ser: 2 mg/dL — ABNORMAL HIGH (ref 0.44–1.00)
GFR, Estimated: 30 mL/min — ABNORMAL LOW (ref >60)
Glucose, Bld: 170 mg/dL — ABNORMAL HIGH (ref 70–99)
Potassium: 5.4 mmol/L — ABNORMAL HIGH (ref 3.5–5.1)
Sodium: 136 mmol/L (ref 135–145)

## 2020-03-09 LAB — CBC
HCT: 29 % — ABNORMAL LOW (ref 36.0–46.0)
Hemoglobin: 9 g/dL — ABNORMAL LOW (ref 12.0–15.0)
MCH: 26.5 pg (ref 26.0–34.0)
MCHC: 31 g/dL (ref 30.0–36.0)
MCV: 85.3 fL (ref 80.0–100.0)
Platelets: 158 10*3/uL (ref 150–400)
RBC: 3.4 MIL/uL — ABNORMAL LOW (ref 3.87–5.11)
RDW: 14.4 % (ref 11.5–15.5)
WBC: 7.4 10*3/uL (ref 4.0–10.5)
nRBC: 0 % (ref 0.0–0.2)

## 2020-03-09 LAB — MAGNESIUM: Magnesium: 1.8 mg/dL (ref 1.7–2.4)

## 2020-03-09 LAB — BPAM RBC
Blood Product Expiration Date: 202202172359
ISSUE DATE / TIME: 202202040436
Unit Type and Rh: 9500

## 2020-03-09 MED ORDER — SODIUM POLYSTYRENE SULFONATE 15 GM/60ML PO SUSP
15.0000 g | Freq: Once | ORAL | Status: DC
  Filled 2020-03-09: qty 60

## 2020-03-09 NOTE — Discharge Summary (Signed)
Physician Discharge Summary   [place]  female DOB: [birth year]  GMW:[id number]  PCP: [name], MD  Admit date: 03/07/2020 Discharge date: 03/09/2020  Admitted From: home Disposition:  home CODE STATUS: Full code    Hospital Course:  For full details, please see H&P, progress notes, consult notes and ancillary notes.  Briefly,  [place] a 50 y.o.femalewith medical history significant forchronic diastolic heart failure, hypertension, peptic ulcers severe disease with gastric ulcer in 2013, type 2 diabetes mellitus with most recent hemoglobin A1c 5.0% when checked in December 2021, chronic anemia with baseline hemoglobin 10-11, GERD, stage IIIb chronic kidney disease with baseline creatinine 1.5-2.0,recent hospitalization upper gastrointestinal bleeding, Roux en Y,who is admitted to [facility] on2/3/2022with suspected acute upper gastrointestinal bleedingafter presenting from home to [facility] Emergency Department complaining of dark-colored stool.   #) Acute Upper GI Bleed 2/2 gastric ulcer 5 days of dark-colored stool, with elevation of BUN, fecal occult positive test performed in the ED.  She was on dual antiplatelet therapy as an outpatient with daily baby aspirin as well as Plavix in the setting of PCI with stent placement in November 2021, although she has held her Plavix while continuing to take her aspirin since onset of the dark-colored stools 5 days ago, at the instruction of her cardiologist.   Pt has a history of Roux-en-Y,with a similar presentation leading to hospitalization in December 2021 for further evaluation of acute upper gastrointestinal bleed, with ensuing nuclear medicine bleeding scan revealing evidence of bleeding at the level of the stomach prompting transfer to [facility] for endoscopic evaluation, which showed no evidence of active bleed. During this hospitalization, pt received EGD which found ulceration at  gastrojejunal anastomosis, treated.  Hgb stable and was 9.0 on the day of discharge.  Pt was continued on PPI BID.  #) Acute on chronic anemia:  --baseline hemoglobin 10-11, presenting hemoglobin found to be 8.6.anemia workup showed mild iron def.  Hgb stable and was 9.0 on the day of discharge.   #) Acute kidney injury on CKD 3B baseline creatinine 1.5-2.0, presenting creatinine noted to be 2.51.  Suspect that this is prerenal in nature in the setting of intravascular depletion as a result of acute on chronic anemia as a consequence of suspected acute upper GI bleed.  Pt received MIVF with improvement in Cr to 2.0 on the day of discharge.  #) Essential hypertension:  home BP meds held on admission.  home amlodipine, coreg, Imdur resumed after EGD.  #) Type 2 diabetes mellitus:  Managed with lifestyle modifications. Most recent hemoglobin A1c was found to be 5.0% on 01/25/2020.  #) Hyperlipidemia resume home statin  #) Chronic diastolic heart failure:  With most recent echocardiogram in October 2021 showing LVEF 60 to 65% and grade 1 diastolic dysfunction. No clinical evidence to suggest decompensated heart failure at this time.  Hypomag --replete with IV mag   Discharge Diagnoses:  Principal Problem:   Acute upper GI bleed Active Problems:   Essential hypertension   Chronic systolic CHF (congestive heart failure) (HCC)   Melena   Acute on chronic anemia   AKI (acute kidney injury) (HCC)   Iron deficiency anemia secondary to blood loss (chronic)    Discharge Instructions:  Allergies as of 03/09/2020      Reactions   Lipitor [atorvastatin] Other (See Comments)   Leg pains   Tramadol Other (See Comments)   Mouth feels like it's on fire  Medication List    TAKE these medications   ALPRAZolam 1 MG tablet Commonly known as: XANAX Take 1 tablet by mouth twice daily as needed   amLODipine 2.5 MG tablet Commonly known as: NORVASC Take 1 tablet (2.5 mg  total) by mouth daily.   buPROPion 200 MG 12 hr tablet Commonly known as: WELLBUTRIN SR Take 200 mg by mouth every morning.   carvedilol 25 MG tablet Commonly known as: COREG Take 1 tablet (25 mg total) by mouth 2 (two) times daily with a meal.   desvenlafaxine 50 MG 24 hr tablet Commonly known as: PRISTIQ Take 50 mg by mouth daily.   diphenoxylate-atropine 2.5-0.025 MG tablet Commonly known as: Lomotil Take 1 tablet by mouth 4 (four) times daily as needed for diarrhea or loose stools.   Emgality 120 MG/ML Soaj Generic drug: Galcanezumab-gnlm Inject 120 mg into the skin every 28 (twenty-eight) days.   Euthyrox 25 MCG tablet Generic drug: levothyroxine TAKE 1 TABLET BY MOUTH ONCE DAILY BEFORE BREAKFAST   famotidine 20 MG tablet Commonly known as: PEPCID Take 20 mg by mouth 2 (two) times daily.   gabapentin 300 MG capsule Commonly known as: NEURONTIN Take 2 capsules (600 mg total) by mouth 2 (two) times daily.   isosorbide mononitrate 120 MG 24 hr tablet Commonly known as: IMDUR Take 1 tablet (120 mg total) by mouth daily.   nitroGLYCERIN 0.4 MG SL tablet Commonly known as: NITROSTAT Place 1 tablet (0.4 mg total) under the tongue every 5 (five) minutes as needed for chest pain.   pantoprazole 40 MG tablet Commonly known as: PROTONIX Take 1 tablet by mouth twice daily   promethazine 25 MG tablet Commonly known as: PHENERGAN TAKE 1 TABLET BY MOUTH EVERY 6 HOURS AS NEEDED FOR NAUSEA FOR VOMITING   rosuvastatin 40 MG tablet Commonly known as: CRESTOR Take 1 tablet (40 mg total) by mouth daily at 6 PM.   SYSTANE OP Place 1 drop into both eyes daily as needed (dry eyes).   traZODone 100 MG tablet Commonly known as: DESYREL TAKE 1 TO 2 TABLETS BY MOUTH AT BEDTIME   Ubrelvy 100 MG Tabs Generic drug: Ubrogepant Take 100 mg by mouth 2 (two) times daily as needed (migraine).        Follow-up Information    [name], MD. Schedule an appointment as soon  as possible for a visit in 1 week(s).   Specialty: Family Medicine Contact information: [address] [place] [address] [phone]        Minna Merritts, MD .   Specialty: Cardiology Contact information: [address] [place] [address] [phone]               Allergies  Allergen Reactions  . Lipitor [Atorvastatin] Other (See Comments)    Leg pains  . Tramadol Other (See Comments)    Mouth feels like it's on fire     The results of significant diagnostics from this hospitalization (including imaging, microbiology, ancillary and laboratory) are listed below for reference.   Consultations:   Procedures/Studies: NM GI Blood Loss  Result Date: 03/08/2020 CLINICAL DATA:  GI bleed, post gastric bypass surgery 2011, patient states she is still bleeding and passing dark stool EXAM: NUCLEAR MEDICINE GASTROINTESTINAL BLEEDING SCAN TECHNIQUE: Sequential abdominal images were obtained following intravenous administration of Tc-31m labeled red blood cells. RADIOPHARMACEUTICALS:  22.015 mCi Tc-35m pertechnetate in-vitro UltraTag labeled red cells. COMPARISON:  01/25/2020 FINDINGS: Normal blood pool distribution of  tracer. No abnormal gastrointestinal localization of tracer identified to suggest active GI bleeding. Small amount of the labeled tracer within urinary bladder. IMPRESSION: Negative GI bleeding scan. Electronically Signed   By: Lavonia Dana M.D.   On: 03/08/2020 16:51      Labs: BNP (last 3 results) Recent Labs    10/17/19 2040 11/05/19 1247 01/09/20 0859  BNP 130.3* 24.9 30.8   Basic Metabolic Panel: Recent Labs  Lab 03/07/20 1405 03/08/20 0410 03/08/20 0908 03/09/20 1214  NA 138 139  --  136  K 4.7 6.1* 5.0 5.4*  CL 107 109  --  109  CO2 22 22  --  20*  GLUCOSE 125* 66*  --  170*  BUN 42* 37*  --  29*  CREATININE 2.51* 2.28*  --  2.00*  CALCIUM 8.5* 8.5*  --  8.3*  MG  --  1.7  --  1.8   Liver Function  Tests: Recent Labs  Lab 03/07/20 1405 03/08/20 0410  AST 23 18  ALT 17 17  ALKPHOS 88 82  BILITOT 0.4 0.4  PROT 6.3* 5.7*  ALBUMIN 3.0* 2.7*   Recent Labs  Lab 03/07/20 1405  LIPASE 26   No results for input(s): AMMONIA in the last 168 hours. CBC: Recent Labs  Lab 03/07/20 1405 03/07/20 1713 03/07/20 2127 03/08/20 0410 03/08/20 0908 03/09/20 1214  WBC 6.5  --   --  5.4  --  7.4  HGB 8.6* 8.2* 7.2* 7.7* 8.9* 9.0*  HCT 28.1* 26.6* 23.0* 24.4* 28.0* 29.0*  MCV 86.2  --   --  85.0  --  85.3  PLT 171  --   --  158  --  158   Cardiac Enzymes: No results for input(s): CKTOTAL, CKMB, CKMBINDEX, TROPONINI in the last 168 hours. BNP: Invalid input(s): POCBNP CBG: Recent Labs  Lab 03/08/20 0732  GLUCAP 84   D-Dimer No results for input(s): DDIMER in the last 72 hours. Hgb A1c No results for input(s): HGBA1C in the last 72 hours. Lipid Profile No results for input(s): CHOL, HDL, LDLCALC, TRIG, CHOLHDL, LDLDIRECT in the last 72 hours. Thyroid function studies No results for input(s): TSH, T4TOTAL, T3FREE, THYROIDAB in the last 72 hours.  Invalid input(s): FREET3 Anemia work up Recent Labs    03/07/20 1713  FERRITIN 18  TIBC 245*  IRON 26*   Urinalysis    Component Value Date/Time   COLORURINE YELLOW 03/07/2020 1715   APPEARANCEUR CLEAR 03/07/2020 1715   APPEARANCEUR Cloudy (A) 07/17/2014 1455   LABSPEC 1.015 03/07/2020 1715   LABSPEC 1.012 12/29/2013 1210   PHURINE 5.0 03/07/2020 1715   GLUCOSEU NEGATIVE 03/07/2020 1715   GLUCOSEU >=500 12/29/2013 1210   HGBUR TRACE (A) 03/07/2020 1715   HGBUR negative 04/14/2010 1435   BILIRUBINUR NEGATIVE 03/07/2020 1715   BILIRUBINUR negative 01/09/2020 0927   BILIRUBINUR Negative 07/17/2014 1455   BILIRUBINUR Negative 12/29/2013 1210   KETONESUR NEGATIVE 03/07/2020 1715   PROTEINUR NEGATIVE 03/07/2020 1715   UROBILINOGEN 0.2 01/09/2020 0927   UROBILINOGEN negative 04/14/2010 1435   NITRITE POSITIVE (A)  03/07/2020 1715   LEUKOCYTESUR MODERATE (A) 03/07/2020 1715   LEUKOCYTESUR Negative 12/29/2013 1210   Sepsis Labs Invalid input(s): PROCALCITONIN,  WBC,  LACTICIDVEN Microbiology Recent Results (from the past 240 hour(s))  SARS Coronavirus 2 by RT PCR (hospital order, performed in [facility] hospital lab) Nasopharyngeal Nasopharyngeal Swab     Status: None   Collection Time: 03/07/20  2:23 PM   Specimen: Nasopharyngeal Swab  Result Value Ref Range Status   SARS Coronavirus 2 NEGATIVE NEGATIVE Final    Comment: (NOTE) SARS-CoV-2 target nucleic acids are NOT DETECTED.  The SARS-CoV-2 RNA is generally detectable in upper and lower respiratory specimens during the acute phase of infection. The lowest concentration of SARS-CoV-2 viral copies this assay can detect is 250 copies / mL. A negative result does not preclude SARS-CoV-2 infection and should not be used as the sole basis for treatment or other patient management decisions.  A negative result may occur with improper specimen collection / handling, submission of specimen other than nasopharyngeal swab, presence of viral mutation(s) within the areas targeted by this assay, and inadequate number of viral copies (<250 copies / mL). A negative result must be combined with clinical observations, patient history, and epidemiological information.  Fact Sheet for Patients:   StrictlyIdeas.no  Fact Sheet for Healthcare Providers: BankingDealers.co.za  This test is not yet approved or  cleared by the [place] FDA and has been authorized for detection and/or diagnosis of SARS-CoV-2 by FDA under an Emergency Use Authorization (EUA).  This EUA will remain in effect (meaning this test can be used) for the duration of the COVID-19 declaration under Section 564(b)(1) of the Act, 21 U.S.C. section 360bbb-3(b)(1), unless the authorization is terminated or revoked sooner.  Performed at  [facility], [address] [place], [place] [address]      Total time spend on discharging this patient, including the last patient exam, discussing the hospital stay, instructions for ongoing care as it relates to all pertinent caregivers, as well as preparing the medical discharge records, prescriptions, and/or referrals as applicable, is 35 minutes.    Enzo Bi, MD  [facility] 03/09/2020, 1:03 PM

## 2020-03-09 NOTE — Progress Notes (Signed)
Kendra Antigua, MD [address] [place], [place], [place], [address] [address], [place], [place], [place], [address] Phone: [phone]  Fax: [phone]   Subjective: Patient reports brown stool yesterday night after her upper endoscopy. Tolerating oral diet without difficulty. No nausea or vomiting. Denies any further melena. No bowel movements this morning   Objective: Exam: Vital signs in last 24 hours: Vitals:   03/08/20 1152 03/08/20 1954 03/09/20 0101 03/09/20 0440  BP:  (!) 165/74 (!) 144/69 (!) 136/59  Pulse:  (!) 57 (!) 52 (!) 55  Resp:  18 18 18   Temp: 97.9 F (36.6 C) 98.2 F (36.8 C) 98 F (36.7 C) 97.8 F (36.6 C)  TempSrc: Temporal     SpO2:  99% 97% 98%  Weight:      Height:       Weight change: -0 kg  Intake/Output Summary (Last 24 hours) at 03/09/2020 0745 Last data filed at 03/08/2020 1824 Gross per 24 hour  Intake 220 ml  Output --  Net 220 ml    General: No acute distress, AAO x3 Abd: Soft, NT/ND, No HSM Skin: Warm, no rashes Neck: Supple, Trachea midline   Lab Results: Lab Results  Component Value Date   WBC 5.4 03/08/2020   HGB 8.9 (L) 03/08/2020   HCT 28.0 (L) 03/08/2020   MCV 85.0 03/08/2020   PLT 158 03/08/2020   Micro Results: Recent Results (from the past 240 hour(s))  SARS Coronavirus 2 by RT PCR (hospital order, performed in [place] hospital lab) Nasopharyngeal Nasopharyngeal Swab     Status: None   Collection Time: 03/07/20  2:23 PM   Specimen: Nasopharyngeal Swab  Result Value Ref Range Status   SARS Coronavirus 2 NEGATIVE NEGATIVE Final    Comment: (NOTE) SARS-CoV-2 target nucleic acids are NOT DETECTED.  The SARS-CoV-2 RNA is generally detectable in upper and lower respiratory specimens during the acute phase of infection. The lowest concentration of SARS-CoV-2 viral copies this assay can detect is 250 copies / mL. A negative result does not preclude SARS-CoV-2 infection and should not be used as the  sole basis for treatment or other patient management decisions.  A negative result may occur with improper specimen collection / handling, submission of specimen other than nasopharyngeal swab, presence of viral mutation(s) within the areas targeted by this assay, and inadequate number of viral copies (<250 copies / mL). A negative result must be combined with clinical observations, patient history, and epidemiological information.  Fact Sheet for Patients:   StrictlyIdeas.no  Fact Sheet for Healthcare Providers: BankingDealers.co.za  This test is not yet approved or  cleared by the [place] FDA and has been authorized for detection and/or diagnosis of SARS-CoV-2 by FDA under an Emergency Use Authorization (EUA).  This EUA will remain in effect (meaning this test can be used) for the duration of the COVID-19 declaration under Section 564(b)(1) of the Act, 21 U.S.C. section 360bbb-3(b)(1), unless the authorization is terminated or revoked sooner.  Performed at [facility], [address] [place], [place] [address]    Studies/Results: NM GI Blood Loss  Result Date: 03/08/2020 CLINICAL DATA:  GI bleed, post gastric bypass surgery 2011, patient states she is still bleeding and passing dark stool EXAM: NUCLEAR MEDICINE GASTROINTESTINAL BLEEDING SCAN TECHNIQUE: Sequential abdominal images were obtained following intravenous administration of Tc-21m labeled red blood cells. RADIOPHARMACEUTICALS:  22.015 mCi Tc-79m pertechnetate in-vitro UltraTag labeled red cells. COMPARISON:  01/25/2020 FINDINGS: Normal blood pool distribution of tracer.  No abnormal gastrointestinal localization of tracer identified to suggest active GI bleeding. Small amount of the labeled tracer within urinary bladder. IMPRESSION: Negative GI bleeding scan. Electronically Signed   By: Lavonia Dana M.D.   On: 03/08/2020 16:51   Medications:  Scheduled Meds: .  amLODipine  2.5 mg Oral Daily  . buPROPion  200 mg Oral q morning - 10a  . carvedilol  25 mg Oral BID WC  . gabapentin  600 mg Oral BID  . isosorbide mononitrate  120 mg Oral Daily  . levothyroxine  25 mcg Oral Q0600  . pantoprazole (PROTONIX) IV  40 mg Intravenous Q12H  . rosuvastatin  40 mg Oral q1800  . traZODone  100 mg Oral QHS   Continuous Infusions: . sodium chloride Stopped (03/08/20 0453)   PRN Meds:.ALPRAZolam, ondansetron (ZOFRAN) IV   Assessment: Principal Problem:   Acute upper GI bleed Active Problems:   Essential hypertension   Chronic systolic CHF (congestive heart failure) (HCC)   Melena   Acute on chronic anemia   AKI (acute kidney injury) ([place])   Iron deficiency anemia secondary to blood loss (chronic)    Plan: Last hemoglobin was last night and was 8.9 which was stable from before Upper endoscopy yesterday showed ulcer at [place] anastomosis which was treated with APC. Would recommend repeat hemoglobin today to ensure it is stable Patient denies any further melena, hematochezia and tolerating oral diet without nausea vomiting or any difficulty  PPI IV twice daily  Continue serial CBCs and transfuse PRN Avoid NSAIDs Maintain 2 large-bore IV lines Please page GI with any acute hemodynamic changes, or signs of active GI bleeding    LOS: 2 days   Kendra Antigua, MD 03/09/2020, 7:45 AM

## 2020-03-11 ENCOUNTER — Other Ambulatory Visit: Payer: Self-pay

## 2020-03-11 ENCOUNTER — Telehealth: Payer: Self-pay

## 2020-03-11 ENCOUNTER — Ambulatory Visit (INDEPENDENT_AMBULATORY_CARE_PROVIDER_SITE_OTHER): Payer: Medicare Other | Admitting: Gastroenterology

## 2020-03-11 ENCOUNTER — Encounter: Payer: Self-pay | Admitting: Gastroenterology

## 2020-03-11 VITALS — BP 138/78 | HR 94 | Temp 97.3°F | Ht 63.0 in | Wt 158.4 lb

## 2020-03-11 DIAGNOSIS — R197 Diarrhea, unspecified: Secondary | ICD-10-CM | POA: Diagnosis not present

## 2020-03-11 DIAGNOSIS — R194 Change in bowel habit: Secondary | ICD-10-CM | POA: Diagnosis not present

## 2020-03-11 NOTE — Telephone Encounter (Signed)
Transition Care Management Follow-up Telephone Call  Date of discharge and from where: [facility] on 03/09/20  How have you been since you were released from the hospital? Doing good and feels better today. Pt is still tired but thinks she over done it yesterday. Stools are still dark brown colored and loose. Pt has had an on going issue with bowel leakage but pt sees Dr Allen Norris about issue today. Pt has had nausea but states that has been a chronic issue too. Pt has not checked her BP while being home and states the only reason it was elevated in the hospital was due to being NPO and did not take B/P meds. Last BS reading was yesterday evening and it was 145 after dinner. Declines pain, bleeding, SOB, weakness or v/d.  Any questions or concerns? No   Items Reviewed:  Did the pt receive and understand the discharge instructions provided? Yes   Medications obtained and verified? No, declined verifying medications. No changes made.   Any new allergies since your discharge? No   Dietary orders reviewed? Yes  Do you have support at home? Yes   Other (ie: DME, Home Health, etc): HHPT was ordered due to leg weakness.  Functional Questionnaire: (I = Independent and D = Dependent)  Bathing/Dressing- I   Meal Prep- I  Eating- I  Maintaining continence- D, has to wear depends due to bowel and urine leakage.  Transferring/Ambulation- D, uses a cane or walker.   Managing Meds- I   Follow up appointments reviewed:    PCP Hospital f/u appt confirmed? Yes , scheduled to see Dr Caryn Section on 03/13/20 @ 9:00 AM.  [facility] f/u appt confirmed? No, pt plans to call and schedule apt today.    Are transportation arrangements needed? No   If their condition worsens, is the pt aware to call  their PCP or go to the ED? Yes  Was the patient provided with contact information for the PCP's office or ED? Yes  Was the pt encouraged to call back with questions or concerns? Yes

## 2020-03-11 NOTE — Progress Notes (Signed)
Primary Care Physician: [name], MD  Primary Gastroenterologist:  Dr. [name]  Chief Complaint  Patient presents with  . Follow-up    Melena    HPI: [name] is a 50 y.o. female here for follow-up after being discharged from the hospital a few days ago. The patient had a anastomotic ulcer that was not actively bleeding but it was treated with cautery. The patient states her stools are now brown and not black anymore. She denies any nausea vomiting. She does report that the Lomotil is not helping her for her diarrhea anymore and she is now having stool seepage. She is wearing pads so that she does not soil herself.  Past Medical History:  Diagnosis Date  . Acute colitis 01/27/2017  . Acute pyelonephritis   . Anemia    iron deficiency anemia  . Aortic arch aneurysm ([place])   . BRCA negative 2014  . CAD (coronary artery disease)    a. 08/2003 Cath: LAD 30-40-med Rx; b. 11/2014 PCI: LAD 8m(3.25x23 Xience Alpine DES); c. 06/2015 PCI: D1 (2.25x12 Resolute Integrity DES); d. 06/2017 PCI: Patent mLAD stent, D2 95 (PTCA); e. 09/2017 PCI: D2 99ost (CBA); d. 12/2017 Cath: LM nl, LAD 359m80d (small), D1 40ost, D2 95ost, LCX 40p, RCA 40ost/p->Med rx for D2 given restenosis.  . Colitis 06/03/2015  . Colon polyp   . CVA (cerebral vascular accident) ([facility]   Left side weakness.   . Degenerative tear of glenoid labrum of right shoulder 03/15/2017  . [place] ([facility]  . Family history of breast cancer    BRCA neg 2014  . Femur fracture, left ([facility]/10/2018  . Gastric ulcer 04/27/2011  . History of echocardiogram    a. 03/2017 Echo: EF 60-65%, no rwma; b. 02/2018 Echo: EF 60-65%, no rwma. Nl RV fxn. No cardiac source of emboli (admitted w/ stroke).  . Hypertension   . Malignant melanoma of skin of scalp ([facility]  . MI, acute, non ST segment elevation ([facility]  . Neuromuscular disorder ([facility]  . S/P drug eluting coronary stent placement 06/04/2015  .  Sepsis ([facility]  . [facility]   a. 02/2018 MRI: 50m24mate acute/early subacute L medial frontal lobe inarct; b. 02/2018 MRA No large vessel occlusion or aneurysm. Mod to sev L P2 stenosis. thready L vertebral artery, diffusely dzs'd; c. 02/2018 Carotid U/S: <50% bilat ICA dzs.    Current Outpatient Medications  Medication Sig Dispense Refill  . ALPRAZolam (XANAX) 1 MG tablet Take 1 tablet by mouth twice daily as needed 60 tablet 5  . amLODipine (NORVASC) 2.5 MG tablet Take 1 tablet (2.5 mg total) by mouth daily. 90 tablet 3  . buPROPion (WELLBUTRIN SR) 200 MG 12 hr tablet Take 200 mg by mouth every morning.    . carvedilol (COREG) 25 MG tablet Take 1 tablet (25 mg total) by mouth 2 (two) times daily with a meal. 180 tablet 3  . desvenlafaxine (PRISTIQ) 50 MG 24 hr tablet Take 50 mg by mouth daily.    . diphenoxylate-atropine (LOMOTIL) 2.5-0.025 MG tablet Take 1 tablet by mouth 4 (four) times daily as needed for diarrhea or loose stools. 120 tablet 3  . EUTHYROX 25 MCG tablet TAKE 1 TABLET BY MOUTH ONCE DAILY BEFORE BREAKFAST 90 tablet 3  . famotidine (PEPCID) 20 MG tablet Take 20 mg by mouth 2 (two) times daily.    . gMarland Kitchenbapentin (NEURONTIN) 300 MG  capsule Take 2 capsules (600 mg total) by mouth 2 (two) times daily. 360 capsule 4  . Galcanezumab-gnlm (EMGALITY) 120 MG/ML SOAJ Inject 120 mg into the skin every 28 (twenty-eight) days.    . isosorbide mononitrate (IMDUR) 120 MG 24 hr tablet Take 1 tablet (120 mg total) by mouth daily. 30 tablet 3  . nitroGLYCERIN (NITROSTAT) 0.4 MG SL tablet Place 1 tablet (0.4 mg total) under the tongue every 5 (five) minutes as needed for chest pain. 25 tablet 2  . pantoprazole (PROTONIX) 40 MG tablet Take 1 tablet by mouth twice daily 180 tablet 1  . Polyethyl Glycol-Propyl Glycol (SYSTANE OP) Place 1 drop into both eyes daily as needed (dry eyes).    . promethazine (PHENERGAN) 25 MG tablet TAKE 1 TABLET BY MOUTH EVERY 6 HOURS AS NEEDED FOR NAUSEA FOR  VOMITING 30 tablet 1  . rosuvastatin (CRESTOR) 40 MG tablet Take 1 tablet (40 mg total) by mouth daily at 6 PM. 90 tablet 3  . traZODone (DESYREL) 100 MG tablet TAKE 1 TO 2 TABLETS BY MOUTH AT BEDTIME 30 tablet 5  . UBRELVY 100 MG TABS Take 100 mg by mouth 2 (two) times daily as needed (migraine).      No current facility-administered medications for this visit.    Allergies as of 03/11/2020 - Review Complete 03/11/2020  Allergen Reaction Noted  . Lipitor [atorvastatin] Other (See Comments) 04/14/2017  . Tramadol Other (See Comments) 01/12/2013    ROS:  General: Negative for anorexia, weight loss, fever, chills, fatigue, weakness. ENT: Negative for hoarseness, difficulty swallowing , nasal congestion. CV: Negative for chest pain, angina, palpitations, dyspnea on exertion, peripheral edema.  Respiratory: Negative for dyspnea at rest, dyspnea on exertion, cough, sputum, wheezing.  GI: See history of present illness. GU:  Negative for dysuria, hematuria, urinary incontinence, urinary frequency, nocturnal urination.  Endo: Negative for unusual weight change.    Physical Examination:   BP 138/78   Pulse 94   Temp (!) 97.3 F (36.3 C) (Temporal)   Ht _0  (1.6 m)   Wt 158 lb 6.4 oz (71.8 kg)   BMI 28.06 kg/m   General: Well-nourished, well-developed in no acute distress.  Eyes: No icterus. Conjunctivae pink. Skin: Warm and dry, no jaundice.   Psych: Alert and cooperative, normal mood and affect.  Labs:    Imaging Studies: NM GI Blood Loss  Result Date: 03/08/2020 CLINICAL DATA:  GI bleed, post gastric bypass surgery 2011, patient states she is still bleeding and passing dark stool EXAM: NUCLEAR MEDICINE GASTROINTESTINAL BLEEDING SCAN TECHNIQUE: Sequential abdominal images were obtained following intravenous administration of Tc-13mlabeled red blood cells. RADIOPHARMACEUTICALS:  22.015 mCi Tc-930mertechnetate in-vitro UltraTag labeled red cells. COMPARISON:  01/25/2020  FINDINGS: Normal blood pool distribution of tracer. No abnormal gastrointestinal localization of tracer identified to suggest active GI bleeding. Small amount of the labeled tracer within urinary bladder. IMPRESSION: Negative GI bleeding scan. Electronically Signed   By: MaLavonia Dana.D.   On: 03/08/2020 16:51    Assessment and Plan:   DaRajanae MantiaeTobin Chads a [age].o. y/o female who comes in after being discharged from the hospital with melena and anemia. The patient's stools are back to brown and she is more worried about her family history of colon cancer with her father having colon cancer in his 4020sThe patient is also concerned that the diarrhea has become bothersome with stool seepage. The patient has had a change in bowel habits and a  family history of colon cancer and despite having a colonoscopy 3 years ago she will be set up for a repeat colonoscopy due to her concerns and the change in symptoms. The patient has been explained the plan agrees with it.     [name], MD. [facility]    Note: This dictation was prepared with Dragon dictation along with smaller phrase technology. Any transcriptional errors that result from this process are unintentional.

## 2020-03-11 NOTE — Telephone Encounter (Signed)
No HFU scheduled at this time. 

## 2020-03-11 NOTE — H&P (View-Only) (Signed)
Primary Care Physician: [name], MD  Primary Gastroenterologist:  Dr. [name]  Chief Complaint  Patient presents with  . Follow-up    Melena    HPI: [name] is a 50 y.o. female here for follow-up after being discharged from the hospital a few days ago. The patient had a anastomotic ulcer that was not actively bleeding but it was treated with cautery. The patient states her stools are now brown and not black anymore. She denies any nausea vomiting. She does report that the Lomotil is not helping her for her diarrhea anymore and she is now having stool seepage. She is wearing pads so that she does not soil herself.  Past Medical History:  Diagnosis Date  . Acute colitis 01/27/2017  . Acute pyelonephritis   . Anemia    iron deficiency anemia  . Aortic arch aneurysm ([place])   . BRCA negative 2014  . CAD (coronary artery disease)    a. 08/2003 Cath: LAD 30-40-med Rx; b. 11/2014 PCI: LAD 8m(3.25x23 Xience Alpine DES); c. 06/2015 PCI: D1 (2.25x12 Resolute Integrity DES); d. 06/2017 PCI: Patent mLAD stent, D2 95 (PTCA); e. 09/2017 PCI: D2 99ost (CBA); d. 12/2017 Cath: LM nl, LAD 359m80d (small), D1 40ost, D2 95ost, LCX 40p, RCA 40ost/p->Med rx for D2 given restenosis.  . Colitis 06/03/2015  . Colon polyp   . CVA (cerebral vascular accident) ([facility]   Left side weakness.   . Degenerative tear of glenoid labrum of right shoulder 03/15/2017  . Diabetes mellitus without complication ([facility]  . Family history of breast cancer    BRCA neg 2014  . Femur fracture, left ([facility]/10/[place]  . Gastric ulcer 04/27/2011  . History of echocardiogram    a. 03/2017 Echo: EF 60-65%, no rwma; b. 02/2018 Echo: EF 60-65%, no rwma. Nl RV fxn. No cardiac source of emboli (admitted w/ stroke).  . Hypertension   . Malignant melanoma of skin of scalp ([facility]  . MI, acute, non ST segment elevation ([facility]  . Neuromuscular disorder ([facility]  . S/P drug eluting coronary stent placement 06/04/2015  .  Sepsis ([facility]  . [facility]   a. 02/2018 MRI: 50m24mate acute/early subacute L medial frontal lobe inarct; b. 02/2018 MRA No large vessel occlusion or aneurysm. Mod to sev L P2 stenosis. thready L vertebral artery, diffusely dzs'd; c. 02/2018 Carotid U/S: <50% bilat ICA dzs.    Current Outpatient Medications  Medication Sig Dispense Refill  . ALPRAZolam (XANAX) 1 MG tablet Take 1 tablet by mouth twice daily as needed 60 tablet 5  . amLODipine (NORVASC) 2.5 MG tablet Take 1 tablet (2.5 mg total) by mouth daily. 90 tablet 3  . buPROPion (WELLBUTRIN SR) 200 MG 12 hr tablet Take 200 mg by mouth every morning.    . carvedilol (COREG) 25 MG tablet Take 1 tablet (25 mg total) by mouth 2 (two) times daily with a meal. 180 tablet 3  . desvenlafaxine (PRISTIQ) 50 MG 24 hr tablet Take 50 mg by mouth daily.    . diphenoxylate-atropine (LOMOTIL) 2.5-0.025 MG tablet Take 1 tablet by mouth 4 (four) times daily as needed for diarrhea or loose stools. 120 tablet 3  . EUTHYROX 25 MCG tablet TAKE 1 TABLET BY MOUTH ONCE DAILY BEFORE BREAKFAST 90 tablet 3  . famotidine (PEPCID) 20 MG tablet Take 20 mg by mouth 2 (two) times daily.    . gMarland Kitchenbapentin (NEURONTIN) 300 MG  capsule Take 2 capsules (600 mg total) by mouth 2 (two) times daily. 360 capsule 4  . Galcanezumab-gnlm (EMGALITY) 120 MG/ML SOAJ Inject 120 mg into the skin every 28 (twenty-eight) days.    . isosorbide mononitrate (IMDUR) 120 MG 24 hr tablet Take 1 tablet (120 mg total) by mouth daily. 30 tablet 3  . nitroGLYCERIN (NITROSTAT) 0.4 MG SL tablet Place 1 tablet (0.4 mg total) under the tongue every 5 (five) minutes as needed for chest pain. 25 tablet 2  . pantoprazole (PROTONIX) 40 MG tablet Take 1 tablet by mouth twice daily 180 tablet 1  . Polyethyl Glycol-Propyl Glycol (SYSTANE OP) Place 1 drop into both eyes daily as needed (dry eyes).    . promethazine (PHENERGAN) 25 MG tablet TAKE 1 TABLET BY MOUTH EVERY 6 HOURS AS NEEDED FOR NAUSEA FOR  VOMITING 30 tablet 1  . rosuvastatin (CRESTOR) 40 MG tablet Take 1 tablet (40 mg total) by mouth daily at 6 PM. 90 tablet 3  . traZODone (DESYREL) 100 MG tablet TAKE 1 TO 2 TABLETS BY MOUTH AT BEDTIME 30 tablet 5  . UBRELVY 100 MG TABS Take 100 mg by mouth 2 (two) times daily as needed (migraine).      No current facility-administered medications for this visit.    Allergies as of 03/11/2020 - Review Complete 03/11/2020  Allergen Reaction Noted  . Lipitor [atorvastatin] Other (See Comments) 04/14/2017  . Tramadol Other (See Comments) 01/12/2013    ROS:  General: Negative for anorexia, weight loss, fever, chills, fatigue, weakness. ENT: Negative for hoarseness, difficulty swallowing , nasal congestion. CV: Negative for chest pain, angina, palpitations, dyspnea on exertion, peripheral edema.  Respiratory: Negative for dyspnea at rest, dyspnea on exertion, cough, sputum, wheezing.  GI: See history of present illness. GU:  Negative for dysuria, hematuria, urinary incontinence, urinary frequency, nocturnal urination.  Endo: Negative for unusual weight change.    Physical Examination:   BP 138/78   Pulse 94   Temp (!) 97.3 F (36.3 C) (Temporal)   Ht _0  (1.6 m)   Wt 158 lb 6.4 oz (71.8 kg)   BMI 28.06 kg/m   General: Well-nourished, well-developed in no acute distress.  Eyes: No icterus. Conjunctivae pink. Skin: Warm and dry, no jaundice.   Psych: Alert and cooperative, normal mood and affect.  Labs:    Imaging Studies: NM GI Blood Loss  Result Date: 03/08/2020 CLINICAL DATA:  GI bleed, post gastric bypass surgery 2011, patient states she is still bleeding and passing dark stool EXAM: NUCLEAR MEDICINE GASTROINTESTINAL BLEEDING SCAN TECHNIQUE: Sequential abdominal images were obtained following intravenous administration of Tc-13mlabeled red blood cells. RADIOPHARMACEUTICALS:  22.015 mCi Tc-930mertechnetate in-vitro UltraTag labeled red cells. COMPARISON:  01/25/2020  FINDINGS: Normal blood pool distribution of tracer. No abnormal gastrointestinal localization of tracer identified to suggest active GI bleeding. Small amount of the labeled tracer within urinary bladder. IMPRESSION: Negative GI bleeding scan. Electronically Signed   By: MaLavonia Dana.D.   On: 03/08/2020 16:51    Assessment and Plan:   [name] a [age].o. y/o female who comes in after being discharged from the hospital with melena and anemia. The patient's stools are back to brown and she is more worried about her family history of colon cancer with her father having colon cancer in his 4020sThe patient is also concerned that the diarrhea has become bothersome with stool seepage. The patient has had a change in bowel habits and a  family history of colon cancer and despite having a colonoscopy 3 years ago she will be set up for a repeat colonoscopy due to her concerns and the change in symptoms. The patient has been explained the plan agrees with it.     [name], MD. [facility]    Note: This dictation was prepared with Dragon dictation along with smaller phrase technology. Any transcriptional errors that result from this process are unintentional.

## 2020-03-12 ENCOUNTER — Encounter: Payer: Self-pay | Admitting: *Deleted

## 2020-03-12 ENCOUNTER — Other Ambulatory Visit: Payer: Self-pay

## 2020-03-12 ENCOUNTER — Encounter: Payer: Self-pay | Admitting: Gastroenterology

## 2020-03-12 ENCOUNTER — Telehealth: Payer: Self-pay | Admitting: Gastroenterology

## 2020-03-12 DIAGNOSIS — Z955 Presence of coronary angioplasty implant and graft: Secondary | ICD-10-CM

## 2020-03-12 DIAGNOSIS — R197 Diarrhea, unspecified: Secondary | ICD-10-CM

## 2020-03-12 DIAGNOSIS — R194 Change in bowel habit: Secondary | ICD-10-CM

## 2020-03-12 MED ORDER — SUPREP BOWEL PREP KIT 17.5-3.13-1.6 GM/177ML PO SOLN: 1.0000 | ORAL | 0 refills | Status: DC

## 2020-03-12 NOTE — Telephone Encounter (Signed)
Patient called and needs prep called into [facility].  Patient says she is there now and prep is not at pharmacy.

## 2020-03-13 ENCOUNTER — Encounter: Payer: Self-pay | Admitting: Family Medicine

## 2020-03-13 ENCOUNTER — Other Ambulatory Visit
Admission: RE | Admit: 2020-03-13 | Discharge: 2020-03-13 | Disposition: A | Discharge status: Home / Self Care | Payer: Medicare Other | Source: Ambulatory Visit | Attending: Gastroenterology | Admitting: Gastroenterology

## 2020-03-13 ENCOUNTER — Other Ambulatory Visit: Payer: Self-pay

## 2020-03-13 ENCOUNTER — Ambulatory Visit (INDEPENDENT_AMBULATORY_CARE_PROVIDER_SITE_OTHER): Payer: Medicare Other | Admitting: Family Medicine

## 2020-03-13 VITALS — BP 94/60 | HR 65 | Temp 97.2°F | Resp 16 | Wt 157.0 lb

## 2020-03-13 DIAGNOSIS — H3500 Unspecified background retinopathy: Secondary | ICD-10-CM

## 2020-03-13 DIAGNOSIS — R0989 Other specified symptoms and signs involving the circulatory and respiratory systems: Secondary | ICD-10-CM

## 2020-03-13 DIAGNOSIS — R159 Full incontinence of feces: Secondary | ICD-10-CM

## 2020-03-13 DIAGNOSIS — N3000 Acute cystitis without hematuria: Secondary | ICD-10-CM

## 2020-03-13 DIAGNOSIS — E039 Hypothyroidism, unspecified: Secondary | ICD-10-CM

## 2020-03-13 DIAGNOSIS — Z01812 Encounter for preprocedural laboratory examination: Secondary | ICD-10-CM | POA: Diagnosis present

## 2020-03-13 DIAGNOSIS — Z20822 Contact with and (suspected) exposure to covid-19: Secondary | ICD-10-CM | POA: Diagnosis not present

## 2020-03-13 DIAGNOSIS — D500 Iron deficiency anemia secondary to blood loss (chronic): Secondary | ICD-10-CM | POA: Diagnosis not present

## 2020-03-13 DIAGNOSIS — R195 Other fecal abnormalities: Secondary | ICD-10-CM

## 2020-03-13 LAB — SARS CORONAVIRUS 2 (TAT 6-24 HRS): SARS Coronavirus 2: NEGATIVE

## 2020-03-13 MED ORDER — FLUCONAZOLE 150 MG PO TABS: 150.0000 mg | ORAL_TABLET | Freq: Once | ORAL | 0 refills | Status: AC

## 2020-03-13 MED ORDER — CEPHALEXIN 500 MG PO CAPS: 500.0000 mg | ORAL_CAPSULE | Freq: Four times a day (QID) | ORAL | 0 refills | Status: AC

## 2020-03-13 NOTE — Discharge Instructions (Signed)
General Anesthesia, Adult, Care After This sheet gives you information about how to care for yourself after your procedure. Your health care provider may also give you more specific instructions. If you have problems or questions, contact your health care provider. What can I expect after the procedure? After the procedure, the following side effects are common:  Pain or discomfort at the IV site.  Nausea.  Vomiting.  Sore throat.  Trouble concentrating.  Feeling cold or chills.  Feeling weak or tired.  Sleepiness and fatigue.  Soreness and body aches. These side effects can affect parts of the body that were not involved in surgery. Follow these instructions at home: For the time period you were told by your health care provider:  Rest.  Do not participate in activities where you could fall or become injured.  Do not drive or use machinery.  Do not drink alcohol.  Do not take sleeping pills or medicines that cause drowsiness.  Do not make important decisions or sign legal documents.  Do not take care of children on your own.   Eating and drinking  Follow any instructions from your health care provider about eating or drinking restrictions.  When you feel hungry, start by eating small amounts of foods that are soft and easy to digest (bland), such as toast. Gradually return to your regular diet.  Drink enough fluid to keep your urine pale yellow.  If you vomit, rehydrate by drinking water, juice, or clear broth. General instructions  If you have sleep apnea, surgery and certain medicines can increase your risk for breathing problems. Follow instructions from your health care provider about wearing your sleep device: ? Anytime you are sleeping, including during daytime naps. ? While taking prescription pain medicines, sleeping medicines, or medicines that make you drowsy.  Have a responsible adult stay with you for the time you are told. It is important to have  someone help care for you until you are awake and alert.  Return to your normal activities as told by your health care provider. Ask your health care provider what activities are safe for you.  Take over-the-counter and prescription medicines only as told by your health care provider.  If you smoke, do not smoke without supervision.  Keep all follow-up visits as told by your health care provider. This is important. Contact a health care provider if:  You have nausea or vomiting that does not get better with medicine.  You cannot eat or drink without vomiting.  You have pain that does not get better with medicine.  You are unable to pass urine.  You develop a skin rash.  You have a fever.  You have redness around your IV site that gets worse. Get help right away if:  You have difficulty breathing.  You have chest pain.  You have blood in your urine or stool, or you vomit blood. Summary  After the procedure, it is common to have a sore throat or nausea. It is also common to feel tired.  Have a responsible adult stay with you for the time you are told. It is important to have someone help care for you until you are awake and alert.  When you feel hungry, start by eating small amounts of foods that are soft and easy to digest (bland), such as toast. Gradually return to your regular diet.  Drink enough fluid to keep your urine pale yellow.  Return to your normal activities as told by your health care provider.   Ask your health care provider what activities are safe for you. This information is not intended to replace advice given to you by your health care provider. Make sure you discuss any questions you have with your health care provider. Document Revised: 10/05/2019 Document Reviewed: 05/04/2019 Elsevier Patient Education  2021 Elsevier Inc.  

## 2020-03-13 NOTE — Patient Instructions (Addendum)
.   Please review the attached list of medications and notify my office if there are any errors.   . Put amlodipine on hold for the next two days.

## 2020-03-13 NOTE — Progress Notes (Signed)
Established patient visit   Patient: [name]   DOB: Mar 04, 1970   50 y.o. Female  MRN: [id number] Visit Date: 03/13/2020  Today's healthcare provider: [name], MD   Chief Complaint  Patient presents with  . Hospitalization Follow-up   Subjective    HPI  Follow up Hospitalization  Patient was admitted to [facility] on 03/07/2020 and discharged on 03/09/2020. She was treated for upper GI bleed. Telephone follow up was done on 03/11/2020 She reports good compliance with treatment. She reports this condition is stayed the same. She still has dark colored stools. Is not on iron supplement.  Patient is scheduled to have a colonoscopy tomorrow.   Home blood pressures have been 100s/50-60s  She also states she has problems controlling bowels and bladder. Stools have been watery for several weeks and suffers from anal leakage. She also has had urinary frequency and urgency for several weeks. She had ER visit last week. She is noted to have moderate LE and positive nitrites on urinalysis last week. She is not sure if she was treated for UTI.  ----------------------------------------------------------------------------------------- -      Medications: Outpatient Medications Prior to Visit  Medication Sig  . ALPRAZolam (XANAX) 1 MG tablet Take 1 tablet by mouth twice daily as needed  . amLODipine (NORVASC) 2.5 MG tablet Take 1 tablet (2.5 mg total) by mouth daily.  Marland Kitchen buPROPion (WELLBUTRIN SR) 200 MG 12 hr tablet Take 200 mg by mouth every morning.  . carvedilol (COREG) 25 MG tablet Take 1 tablet (25 mg total) by mouth 2 (two) times daily with a meal.  . desvenlafaxine (PRISTIQ) 50 MG 24 hr tablet Take 50 mg by mouth daily.  . diphenoxylate-atropine (LOMOTIL) 2.5-0.025 MG tablet Take 1 tablet by mouth 4 (four) times daily as needed for diarrhea or loose stools.  Arna Medici 25 MCG tablet TAKE 1 TABLET BY MOUTH ONCE DAILY BEFORE BREAKFAST  . famotidine (PEPCID) 20 MG  tablet Take 20 mg by mouth 2 (two) times daily.  Marland Kitchen gabapentin (NEURONTIN) 300 MG capsule Take 2 capsules (600 mg total) by mouth 2 (two) times daily.  . Galcanezumab-gnlm (EMGALITY) 120 MG/ML SOAJ Inject 120 mg into the skin every 28 (twenty-eight) days.  . isosorbide mononitrate (IMDUR) 120 MG 24 hr tablet Take 1 tablet (120 mg total) by mouth daily.  . Na Sulfate-K Sulfate-Mg Sulf (SUPREP BOWEL PREP KIT) 17.5-3.13-1.6 GM/177ML SOLN Take 1 kit by mouth as directed.  . nitroGLYCERIN (NITROSTAT) 0.4 MG SL tablet Place 1 tablet (0.4 mg total) under the tongue every 5 (five) minutes as needed for chest pain.  Vladimir Faster Glycol-Propyl Glycol (SYSTANE OP) Place 1 drop into both eyes daily as needed (dry eyes).  . promethazine (PHENERGAN) 25 MG tablet TAKE 1 TABLET BY MOUTH EVERY 6 HOURS AS NEEDED FOR NAUSEA FOR VOMITING  . rosuvastatin (CRESTOR) 40 MG tablet Take 1 tablet (40 mg total) by mouth daily at 6 PM.  . traZODone (DESYREL) 100 MG tablet TAKE 1 TO 2 TABLETS BY MOUTH AT BEDTIME  . UBRELVY 100 MG TABS Take 100 mg by mouth 2 (two) times daily as needed (migraine).    No facility-administered medications prior to visit.    Review of Systems  Constitutional: Positive for fatigue. Negative for appetite change, chills and fever.  Respiratory: Negative for chest tightness and shortness of breath.   Cardiovascular: Negative for chest pain and palpitations.  Gastrointestinal: Negative for abdominal pain, nausea and vomiting.  Dark colored stools  Neurological: Positive for light-headedness. Negative for dizziness and weakness.       Objective    BP 94/60   Pulse 65   Temp (!) 97.2 F (36.2 C) (Temporal)   Resp 16   Wt 157 lb (71.2 kg)   SpO2 99% Comment: room air  BMI 27.81 kg/m     Physical Exam    General: Appearance:     Well developed, well nourished female in no acute distress  Eyes:    PERRL, conjunctiva/corneas clear, EOM's intact       Lungs:     Clear to  auscultation bilaterally, respirations unlabored  Heart:    Normal heart rate. Normal rhythm. No murmurs, rubs, or gallops.   MS:   All extremities are intact.   Neurologic:   Awake, alert, oriented x 3. No apparent focal neurological           defect.          Assessment & Plan     1. Iron deficiency anemia due to chronic blood loss  - CBC Scheduled for colonoscopy tomorrow.   2. Labile blood pressure BP is low this morning and is about to start bowel prep. Counseled to push fluids and electrolytes. He last dose of amlodipine was last evening and advised to put on hold until after the colonoscopy.   - Comprehensive metabolic panel  3. Acute cystitis without hematuria  - cephALEXin (KEFLEX) 500 MG capsule; Take 1 capsule (500 mg total) by mouth 4 (four) times daily for 7 days.  Dispense: 28 capsule; Refill: 0 - Urine Culture  4. Hypothyroidism, unspecified type  - TSH  5. Incontinence of feces, unspecified fecal incontinence type   6. Watery stools Colonoscopy scheduled tomorrow. This seems to be chronic. Consider trial of Xifaxin if no other pathology found on colonoscopy.   Presence of yeast on u/a at recent hospital admission.  - fluconazole (DIFLUCAN) 150 MG tablet; Take 1 tablet (150 mg total) by mouth once for 1 dose.  Dispense: 1 tablet; Refill: 0       The entirety of the information documented in the History of Present Illness, Review of Systems and Physical Exam were personally obtained by me. Portions of this information were initially documented by the CMA and reviewed by me for thoroughness and accuracy.     [name], MD  [facility] [phone] (phone) [phone] (fax)  [place]

## 2020-03-13 NOTE — Telephone Encounter (Signed)
Bowel prep resent to pt's pharmacy.

## 2020-03-14 ENCOUNTER — Ambulatory Visit
Admission: RE | Admit: 2020-03-14 | Discharge: 2020-03-14 | Disposition: A | Discharge status: Home / Self Care | Payer: Medicare Other | Attending: Gastroenterology | Admitting: Gastroenterology

## 2020-03-14 ENCOUNTER — Encounter
Admission: RE | Disposition: A | Discharge status: Home / Self Care | Payer: Self-pay | Source: Home / Self Care | Attending: Gastroenterology

## 2020-03-14 ENCOUNTER — Encounter: Payer: Self-pay | Admitting: Gastroenterology

## 2020-03-14 ENCOUNTER — Ambulatory Visit: Payer: Medicare Other | Admitting: Anesthesiology

## 2020-03-14 DIAGNOSIS — R194 Change in bowel habit: Secondary | ICD-10-CM

## 2020-03-14 DIAGNOSIS — R197 Diarrhea, unspecified: Secondary | ICD-10-CM | POA: Insufficient documentation

## 2020-03-14 DIAGNOSIS — Z888 Allergy status to other drugs, medicaments and biological substances status: Secondary | ICD-10-CM | POA: Diagnosis not present

## 2020-03-14 DIAGNOSIS — Z79899 Other long term (current) drug therapy: Secondary | ICD-10-CM | POA: Insufficient documentation

## 2020-03-14 DIAGNOSIS — Z79890 Hormone replacement therapy (postmenopausal): Secondary | ICD-10-CM | POA: Insufficient documentation

## 2020-03-14 DIAGNOSIS — Z1211 Encounter for screening for malignant neoplasm of colon: Secondary | ICD-10-CM

## 2020-03-14 DIAGNOSIS — Z885 Allergy status to narcotic agent status: Secondary | ICD-10-CM | POA: Insufficient documentation

## 2020-03-14 DIAGNOSIS — Z800 Family history of malignant neoplasm of digestive organs: Secondary | ICD-10-CM

## 2020-03-14 HISTORY — PX: COLONOSCOPY WITH PROPOFOL: SHX5780

## 2020-03-14 HISTORY — PX: BIOPSY: SHX5522

## 2020-03-14 LAB — COMPREHENSIVE METABOLIC PANEL
ALT: 10 IU/L (ref 0–32)
AST: 14 IU/L (ref 0–40)
Albumin/Globulin Ratio: 1.3 (ref 1.2–2.2)
Albumin: 3.3 g/dL — ABNORMAL LOW (ref 3.8–4.9)
Alkaline Phosphatase: 141 IU/L — ABNORMAL HIGH (ref 44–121)
BUN/Creatinine Ratio: 14 (ref 9–23)
BUN: 30 mg/dL — ABNORMAL HIGH (ref 6–24)
Bilirubin Total: 0.2 mg/dL (ref 0.0–1.2)
CO2: 17 mmol/L — ABNORMAL LOW (ref 20–29)
Calcium: 8.8 mg/dL (ref 8.7–10.2)
Chloride: 104 mmol/L (ref 96–106)
Creatinine, Ser: 2.21 mg/dL — ABNORMAL HIGH (ref 0.57–1.00)
GFR calc Af Amer: 29 mL/min/{1.73_m2} — ABNORMAL LOW (ref >59)
GFR calc non Af Amer: 25 mL/min/{1.73_m2} — ABNORMAL LOW (ref >59)
Globulin, Total: 2.6 g/dL (ref 1.5–4.5)
Glucose: 109 mg/dL — ABNORMAL HIGH (ref 65–99)
Potassium: 5.4 mmol/L — ABNORMAL HIGH (ref 3.5–5.2)
Sodium: 137 mmol/L (ref 134–144)
Total Protein: 5.9 g/dL — ABNORMAL LOW (ref 6.0–8.5)

## 2020-03-14 LAB — TSH: TSH: 3.03 u[IU]/mL (ref 0.450–4.500)

## 2020-03-14 LAB — CBC
Hematocrit: 32.5 % — ABNORMAL LOW (ref 34.0–46.6)
Hemoglobin: 10.2 g/dL — ABNORMAL LOW (ref 11.1–15.9)
MCH: 27.2 pg (ref 26.6–33.0)
MCHC: 31.4 g/dL — ABNORMAL LOW (ref 31.5–35.7)
MCV: 87 fL (ref 79–97)
Platelets: 265 10*3/uL (ref 150–450)
RBC: 3.75 x10E6/uL — ABNORMAL LOW (ref 3.77–5.28)
RDW: 13.8 % (ref 11.7–15.4)
WBC: 8.8 10*3/uL (ref 3.4–10.8)

## 2020-03-14 SURGERY — COLONOSCOPY WITH PROPOFOL
Anesthesia: General | Site: Rectum

## 2020-03-14 MED ORDER — STERILE WATER FOR IRRIGATION IR SOLN
Status: DC | PRN
  Administered 2020-03-14: 100 mL

## 2020-03-14 MED ORDER — PROPOFOL 10 MG/ML IV BOLUS
INTRAVENOUS | Status: DC | PRN
  Administered 2020-03-14 (×3): 20 mg via INTRAVENOUS
  Administered 2020-03-14: 30 mg via INTRAVENOUS
  Administered 2020-03-14: 70 mg via INTRAVENOUS
  Administered 2020-03-14 (×3): 20 mg via INTRAVENOUS
  Administered 2020-03-14 (×2): 30 mg via INTRAVENOUS

## 2020-03-14 MED ORDER — LACTATED RINGERS IV SOLN: INTRAVENOUS | Status: DC

## 2020-03-14 MED ORDER — SODIUM CHLORIDE 0.9 % IV SOLN: INTRAVENOUS | Status: DC

## 2020-03-14 MED ORDER — LIDOCAINE HCL (CARDIAC) PF 100 MG/5ML IV SOSY
PREFILLED_SYRINGE | INTRAVENOUS | Status: DC | PRN
  Administered 2020-03-14: 50 mg via INTRAVENOUS

## 2020-03-14 SURGICAL SUPPLY — 25 items
CLIP HMST 235XBRD CATH ROT (MISCELLANEOUS) IMPLANT
CLIP RESOLUTION 360 11X235 (MISCELLANEOUS)
ELECT REM PT RETURN 9FT ADLT (ELECTROSURGICAL)
ELECTRODE REM PT RTRN 9FT ADLT (ELECTROSURGICAL) IMPLANT
FCP ESCP3.2XJMB 240X2.8X (MISCELLANEOUS) ×1
FORCEPS BIOP RAD 4 LRG CAP 4 (CUTTING FORCEPS) IMPLANT
FORCEPS BIOP RJ4 240 W/NDL (MISCELLANEOUS) ×2
FORCEPS ESCP3.2XJMB 240X2.8X (MISCELLANEOUS) IMPLANT
GOWN CVR UNV OPN BCK APRN NK (MISCELLANEOUS) ×2 IMPLANT
GOWN ISOL THUMB LOOP REG UNIV (MISCELLANEOUS) ×4
INJECTOR VARIJECT VIN23 (MISCELLANEOUS) IMPLANT
KIT DEFENDO VALVE AND CONN (KITS) IMPLANT
KIT PRC NS LF DISP ENDO (KITS) ×1 IMPLANT
KIT PROCEDURE OLYMPUS (KITS) ×2
MANIFOLD NEPTUNE II (INSTRUMENTS) ×2 IMPLANT
MARKER SPOT ENDO TATTOO 5ML (MISCELLANEOUS) IMPLANT
PROBE APC STR FIRE (PROBE) IMPLANT
RETRIEVER NET ROTH 2.5X230 LF (MISCELLANEOUS) IMPLANT
SNARE SHORT THROW 13M SML OVAL (MISCELLANEOUS) IMPLANT
SNARE SHORT THROW 30M LRG OVAL (MISCELLANEOUS) IMPLANT
SNARE SNG USE RND 15MM (INSTRUMENTS) IMPLANT
SPOT EX ENDOSCOPIC TATTOO (MISCELLANEOUS)
TRAP ETRAP POLY (MISCELLANEOUS) IMPLANT
VARIJECT INJECTOR VIN23 (MISCELLANEOUS)
WATER STERILE IRR 250ML POUR (IV SOLUTION) ×2 IMPLANT

## 2020-03-14 NOTE — Transfer of Care (Signed)
Immediate Anesthesia Transfer of Care Note  Patient: [name]  Procedure(s) Performed: COLONOSCOPY WITH PROPOFOL (N/A Rectum) BIOPSY (N/A Rectum)  Patient Location: PACU  Anesthesia Type: General  Level of Consciousness: awake, alert  and patient cooperative  Airway and Oxygen Therapy: Patient Spontanous Breathing and Patient connected to supplemental oxygen  Post-op Assessment: Post-op Vital signs reviewed, Patient's Cardiovascular Status Stable, Respiratory Function Stable, Patent Airway and No signs of Nausea or vomiting  Post-op Vital Signs: Reviewed and stable  Complications: No complications documented.

## 2020-03-14 NOTE — Anesthesia Preprocedure Evaluation (Addendum)
Anesthesia Evaluation  Patient identified by MRN, date of birth, ID band Patient awake    Reviewed: Allergy & Precautions, NPO status , Patient's Chart, lab work & pertinent test results  History of Anesthesia Complications (+) history of anesthetic complications  Airway Mallampati: II  TM Distance: >3 FB Neck ROM: Full    Dental  (+) Edentulous Lower, Edentulous Upper   Pulmonary Patient abstained from smoking., former smoker,    breath sounds clear to auscultation       Cardiovascular Exercise Tolerance: Good hypertension, + angina with exertion + CAD, + Past MI, + Cardiac Stents, + Peripheral Vascular Disease and +CHF   Rhythm:Regular Rate:Normal - Systolic murmurs Cath 59/9774 1. Patent stents in the mid LAD and first diagonal with mild to moderate in-stent restenosis, stable moderate mid RCA stenosis, stable diffuse disease in the superior branch of the first diagonal, moderate mid LAD stenosis at the bifurcation of second diagonal which has no significant ostial disease. There is severe progression of stenosis in the mid to distal LAD. In addition, there is moderately severe disease in the apical LAD. 2. Normal left ventricular end-diastolic pressure. 3. Successful angioplasty and drug-eluting stent placement to the mid to distal LAD   Neuro/Psych  Headaches, PSYCHIATRIC DISORDERS Anxiety Depression CVA    GI/Hepatic PUD, GERD  Controlled,Recent hospitalization for GI bleed   Endo/Other  diabetes, Type 2, Oral Hypoglycemic AgentsHypothyroidism   Renal/GU CRFRenal disease     Musculoskeletal  (+) Arthritis ,   Abdominal   Peds  Hematology  (+) Blood dyscrasia, anemia ,   Anesthesia Other Findings   Reproductive/Obstetrics                           Anesthesia Physical  Anesthesia Plan  ASA: III  Anesthesia Plan: General   Post-op Pain Management:    Induction:  Intravenous  PONV Risk Score and Plan: 3 and Propofol infusion and TIVA  Airway Management Planned: Nasal Cannula and Natural Airway  Additional Equipment:   Intra-op Plan:   Post-operative Plan:   Informed Consent: I have reviewed the patients History and Physical, chart, labs and discussed the procedure including the risks, benefits and alternatives for the proposed anesthesia with the patient or authorized representative who has indicated his/her understanding and acceptance.     Dental advisory given  Plan Discussed with: CRNA  Anesthesia Plan Comments:        Anesthesia Quick Evaluation

## 2020-03-14 NOTE — Anesthesia Procedure Notes (Signed)
Performed by: Leiam Hopwood, CRNA Pre-anesthesia Checklist: Patient identified, Emergency Drugs available, Suction available, Timeout performed and Patient being monitored Patient Re-evaluated:Patient Re-evaluated prior to induction Oxygen Delivery Method: Nasal cannula Placement Confirmation: positive ETCO2       

## 2020-03-14 NOTE — Op Note (Signed)
[facility] Patient Name: [name] Procedure Date: 03/14/2020 9:36 AM MRN: [id number] Account #: [id number] Date of Birth: 1970/11/12 Admit Type: Outpatient Age: 50 Room: [facility] OR ROOM 01 Gender: Female Note Status: Finalized Procedure:             Colonoscopy Indications:           Family history of colon cancer in a first-degree                         relative before age 86 years Providers:             [name] MD, MD Referring MD:          Minna Merritts, MD (Referring MD) Medicines:             Propofol per Anesthesia Complications:         No immediate complications. Procedure:             Pre-Anesthesia Assessment:                        - Prior to the procedure, a History and Physical was                         performed, and patient medications and allergies were                         reviewed. The patient's tolerance of previous                         anesthesia was also reviewed. The risks and benefits                         of the procedure and the sedation options and risks                         were discussed with the patient. All questions were                         answered, and informed consent was obtained. Prior                         Anticoagulants: The patient has taken no previous                         anticoagulant or antiplatelet agents. ASA Grade                         Assessment: II - A patient with mild systemic disease.                         After reviewing the risks and benefits, the patient                         was deemed in satisfactory condition to undergo the                         procedure.  After obtaining informed consent, the colonoscope was                         passed under direct vision. Throughout the procedure,                         the patient's blood pressure, pulse, and oxygen                         saturations were monitored continuously. The  was                         introduced through the anus and advanced to the the                         terminal ileum. The colonoscopy was performed without                         difficulty. The patient tolerated the procedure well.                         The quality of the bowel preparation was fair. Findings:      The perianal and digital rectal examinations were normal.      The terminal ileum appeared normal. Biopsies were taken with a cold       forceps for histology.      The colon (entire examined portion) appeared normal. Random biopsies       were obtained with cold forceps for histology randomly in the entire       colon. Impression:            - Preparation of the colon was fair.                        - The examined portion of the ileum was normal.                         Biopsied.                        - The entire examined colon is normal.                        - Random biopsies were obtained in the entire colon. Recommendation:        - Discharge patient to home.                        - Resume previous diet.                        - Continue present medications.                        - Await pathology results. Procedure Code(s):     --- Professional ---                        [phone], Colonoscopy, flexible; with biopsy, single or                         multiple  Diagnosis Code(s):     --- Professional ---                        Z80.0, Family history of malignant neoplasm of                         digestive organs CPT copyright 2019 American Medical Association. All rights reserved. The codes documented in this report are preliminary and upon coder review may  be revised to meet current compliance requirements. [name] MD, MD 03/14/2020 10:01:51 AM This report has been signed electronically. Number of Addenda: 0 Note Initiated On: 03/14/2020 9:36 AM Scope Withdrawal Time: 0 hours 8 minutes 0 seconds  Total Procedure Duration: 0 hours 12 minutes 17 seconds   Estimated Blood Loss:  Estimated blood loss: none.      [facility]

## 2020-03-14 NOTE — Anesthesia Postprocedure Evaluation (Signed)
Anesthesia Post Note  Patient: [name]  Procedure(s) Performed: COLONOSCOPY WITH PROPOFOL (N/A Rectum) BIOPSY (N/A Rectum)     Patient location during evaluation: PACU Anesthesia Type: General Level of consciousness: awake Pain management: pain level controlled Vital Signs Assessment: post-procedure vital signs reviewed and stable Respiratory status: respiratory function stable Cardiovascular status: stable Postop Assessment: no signs of nausea or vomiting Anesthetic complications: no   No complications documented.  Veda Canning

## 2020-03-14 NOTE — Interval H&P Note (Signed)
[name], MD [facility] [address] [place], [place] [address] [phone] Fax : [phone]  Primary Care Physician:  [name], MD Primary Gastroenterologist:  Dr. Allen Norris  Pre-Procedure History & Physical: HPI:  [name] is a 50 y.o. female is here for an colonoscopy.   Past Medical History:  Diagnosis Date   Acute colitis 01/27/2017   Acute pyelonephritis    Anemia    iron deficiency anemia   Aortic arch aneurysm ([place])    BRCA negative 2014   CAD (coronary artery disease)    a. 08/2003 Cath: LAD 30-40-med Rx; b. 11/2014 PCI: LAD 265m(3.25x23 Xience Alpine DES); c. 06/2015 PCI: D1 (2.25x12 Resolute Integrity DES); d. 06/2017 PCI: Patent mLAD stent, D2 95 (PTCA); e. 09/2017 PCI: D2 99ost (CBA); d. 12/2017 Cath: LM nl, LAD 374m80d (small), D1 40ost, D2 95ost, LCX 40p, RCA 40ost/p->Med rx for D2 given restenosis.   Colitis 06/03/2015   Colon polyp    CVA (cerebral vascular accident) ([facility]   Left side weakness.    Degenerative tear of glenoid labrum of right shoulder 03/15/2017   Diabetes mellitus without complication ([facility]   Family history of breast cancer    BRCA neg 2014   Femur fracture, left ([facility]/10/2018   Gastric ulcer 04/27/2011   History of echocardiogram    a. 03/2017 Echo: EF 60-65%, no rwma; b. 02/2018 Echo: EF 60-65%, no rwma. Nl RV fxn. No cardiac source of emboli (admitted w/ stroke).   Hypertension    Malignant melanoma of skin of scalp (HCC)    MI, acute, non ST segment elevation (HCC)    Neuromuscular disorder (HCC)    S/P drug eluting coronary stent placement 06/04/2015   Sepsis ([facility]/14/2019   Stroke ([facility]   a. 02/2018 MRI: 65m38mate acute/early subacute L medial frontal lobe inarct; b. 02/2018 MRA No large vessel occlusion or aneurysm. Mod to sev L P2 stenosis. thready L vertebral artery, diffusely dzs'd; c. 02/2018 Carotid U/S: <50% bilat ICA dzs.    Past Surgical History:  Procedure Laterality Date   APPENDECTOMY      BALLOON ENTEROSCOPY  02/06/2020   [facility] 11/09/2014   Procedure: Coronary Angiography;  Surgeon: TimMinna MerrittsD;  [facility];  Service: Cardiovascular;  Laterality: N/A;   CARDIAC CATHETERIZATION N/A 11/12/2014   Procedure: Coronary Stent Intervention;  Surgeon: AleIsaias CowmanD;  [facility];  Service: Cardiovascular;  Laterality: N/A;   CARDIAC CATHETERIZATION N/A 04/18/2015   Procedure: Left Heart Cath and Coronary Angiography;  Surgeon: TimMinna MerrittsD;  [facility];  Service: Cardiovascular;  Laterality: N/A;   CARDIAC CATHETERIZATION Left 06/04/2015   Procedure: Left Heart Cath and Coronary Angiography;  Surgeon: MuhWellington HampshireD;  [facility];  Service: Cardiovascular;  Laterality: Left;   CARDIAC CATHETERIZATION N/A 06/04/2015   Procedure: Coronary Stent Intervention;  Surgeon: MuhWellington HampshireD;  [facility];  Service: Cardiovascular;  Laterality: N/A;   CESAREAN SECTION  2001   CHOLECYSTECTOMY N/A 11/18/2016   Procedure: LAPAROSCOPIC CHOLECYSTECTOMY WITH INTRAOPERATIVE CHOLANGIOGRAM;  Surgeon: SanChristene LyeD;  [facility];  Service: General;  Laterality: N/A;   COLONOSCOPY WITH PROPOFOL N/A 04/27/2016   Procedure: COLONOSCOPY WITH PROPOFOL;  Surgeon: DarLucilla LameD;  [facility];  Laterality: N/A;   COLONOSCOPY WITH PROPOFOL  N/A 01/12/2018   Procedure: COLONOSCOPY WITH PROPOFOL;  Surgeon: Toledo, Benay Pike, MD;  Location: [facility]OSCOPY;  Service: Endoscopy;  Laterality: N/A;   CORONARY ANGIOPLASTY     CORONARY BALLOON ANGIOPLASTY N/A 06/29/2017   Procedure: CORONARY BALLOON ANGIOPLASTY;  Surgeon: Wellington Hampshire, MD;  Location: [place] CV LAB;  Service: Cardiovascular;  Laterality: N/A;   CORONARY BALLOON ANGIOPLASTY N/A 09/20/2017   Procedure: CORONARY BALLOON ANGIOPLASTY;   Surgeon: Wellington Hampshire, MD;  Location: [place] CV LAB;  Service: Cardiovascular;  Laterality: N/A;   CORONARY STENT INTERVENTION N/A 12/13/2019   Procedure: CORONARY STENT INTERVENTION;  Surgeon: Wellington Hampshire, MD;  Location: [place] CV LAB;  Service: Cardiovascular;  Laterality: N/A;   DILATION AND CURETTAGE OF UTERUS     ESOPHAGOGASTRODUODENOSCOPY (EGD) WITH PROPOFOL N/A 09/14/2014   Procedure: ESOPHAGOGASTRODUODENOSCOPY (EGD) WITH PROPOFOL;  Surgeon: Josefine Class, MD;  [facility];  Service: Endoscopy;  Laterality: N/A;   ESOPHAGOGASTRODUODENOSCOPY (EGD) WITH PROPOFOL N/A 04/27/2016   Procedure: ESOPHAGOGASTRODUODENOSCOPY (EGD) WITH PROPOFOL;  Surgeon: [name], MD;  Location: [place];  Service: Endoscopy;  Laterality: N/A;  Diabetic - oral meds   ESOPHAGOGASTRODUODENOSCOPY (EGD) WITH PROPOFOL N/A 01/12/2018   Procedure: ESOPHAGOGASTRODUODENOSCOPY (EGD) WITH PROPOFOL;  Surgeon: Toledo, Benay Pike, MD;  Location: [facility];  Service: Endoscopy;  Laterality: N/A;   ESOPHAGOGASTRODUODENOSCOPY (EGD) WITH PROPOFOL N/A 04/11/2019   Procedure: ESOPHAGOGASTRODUODENOSCOPY (EGD) WITH PROPOFOL;  Surgeon: [name], MD;  Location: [facility];  Service: Endoscopy;  Laterality: N/A;   ESOPHAGOGASTRODUODENOSCOPY (EGD) WITH PROPOFOL N/A 03/08/2020   Procedure: ESOPHAGOGASTRODUODENOSCOPY (EGD) WITH PROPOFOL;  Surgeon: [name], MD;  Location: [facility];  Service: Endoscopy;  Laterality: N/A;   GASTRIC BYPASS  09/2009   [facility]    INTRAMEDULLARY (IM) NAIL INTERTROCHANTERIC Left 10/13/2018   Procedure: INTRAMEDULLARY (IM) NAIL INTERTROCHANTRIC;  Surgeon: Leandrew Koyanagi, MD;  Location: [place];  Service: Orthopedics;  Laterality: Left;   Left Carotid to sublcavian artery bypass w/ subclavian artery ligation     a. Performed @ [facility].   LEFT HEART CATH AND CORONARY ANGIOGRAPHY Left 06/29/2017   Procedure: LEFT HEART CATH AND  CORONARY ANGIOGRAPHY;  Surgeon: Wellington Hampshire, MD;  Location: [place] CV LAB;  Service: Cardiovascular;  Laterality: Left;   LEFT HEART CATH AND CORONARY ANGIOGRAPHY N/A 09/20/2017   Procedure: LEFT HEART CATH AND CORONARY ANGIOGRAPHY;  Surgeon: Wellington Hampshire, MD;  Location: [place] CV LAB;  Service: Cardiovascular;  Laterality: N/A;   LEFT HEART CATH AND CORONARY ANGIOGRAPHY N/A 12/20/2017   Procedure: LEFT HEART CATH AND CORONARY ANGIOGRAPHY;  Surgeon: Wellington Hampshire, MD;  Location: [place] CV LAB;  Service: Cardiovascular;  Laterality: N/A;   LEFT HEART CATH AND CORONARY ANGIOGRAPHY N/A 04/20/2019   Procedure: LEFT HEART CATH AND CORONARY ANGIOGRAPHY possible PCI;  Surgeon: Minna Merritts, MD;  Location: [place] CV LAB;  Service: Cardiovascular;  Laterality: N/A;   LEFT HEART CATH AND CORONARY ANGIOGRAPHY N/A 12/13/2019   Procedure: LEFT HEART CATH AND CORONARY ANGIOGRAPHY;  Surgeon: Wellington Hampshire, MD;  Location: [place] CV LAB;  Service: Cardiovascular;  Laterality: N/A;   MELANOMA EXCISION  2016   Dr. Henreitta Cea ABLATION  2002   RIGHT OOPHORECTOMY     SHOULDER ARTHROSCOPY WITH OPEN ROTATOR CUFF REPAIR Right 01/07/2016   Procedure: SHOULDER ARTHROSCOPY WITH DEBRIDMENT, SUBACHROMIAL DECOMPRESSION;  Surgeon: Corky Mull, MD;  Location: [facility];  Service: Orthopedics;  Laterality:  Right;   SHOULDER ARTHROSCOPY WITH OPEN ROTATOR CUFF REPAIR Right 03/16/2017   Procedure: SHOULDER ARTHROSCOPY WITH OPEN ROTATOR CUFF REPAIR POSSIBLE BICEPS TENODESIS;  Surgeon: Corky Mull, MD;  Location: [facility];  Service: Orthopedics;  Laterality: Right;   TRIGGER FINGER RELEASE Right     [place]    Prior to Admission medications   Medication Sig Start Date End Date Taking? Authorizing Provider  ALPRAZolam Duanne Moron) 1 MG tablet Take 1 tablet by mouth twice daily as needed 02/16/20  Yes Fisher, Kirstie Peri, MD  amLODipine (NORVASC) 2.5 MG tablet Take 1 tablet  (2.5 mg total) by mouth daily. 10/25/19  Yes Minna Merritts, MD  buPROPion (WELLBUTRIN SR) 200 MG 12 hr tablet Take 200 mg by mouth every morning. 11/17/19  Yes [provider]  carvedilol (COREG) 25 MG tablet Take 1 tablet (25 mg total) by mouth 2 (two) times daily with a meal. 02/27/20  Yes Gollan, Kathlene November, MD  desvenlafaxine (PRISTIQ) 50 MG 24 hr tablet Take 50 mg by mouth daily. 12/11/19  Yes [provider]  diphenoxylate-atropine (LOMOTIL) 2.5-0.025 MG tablet Take 1 tablet by mouth 4 (four) times daily as needed for diarrhea or loose stools. 01/29/20  Yes [name], MD  EUTHYROX 25 MCG tablet TAKE 1 TABLET BY MOUTH ONCE DAILY BEFORE BREAKFAST 12/02/19  Yes [name], MD  famotidine (PEPCID) 20 MG tablet Take 20 mg by mouth 2 (two) times daily.   Yes [provider]  gabapentin (NEURONTIN) 300 MG capsule Take 2 capsules (600 mg total) by mouth 2 (two) times daily. 01/17/20  Yes [name], MD  Galcanezumab-gnlm [facility]) 120 MG/ML SOAJ Inject 120 mg into the skin every 28 (twenty-eight) days. 03/01/20  Yes [provider]  isosorbide mononitrate (IMDUR) 120 MG 24 hr tablet Take 1 tablet (120 mg total) by mouth daily. 10/21/19  Yes Dwyane Dee, MD  Polyethyl Glycol-Propyl Glycol (SYSTANE OP) Place 1 drop into both eyes daily as needed (dry eyes).   Yes [provider]  promethazine (PHENERGAN) 25 MG tablet TAKE 1 TABLET BY MOUTH EVERY 6 HOURS AS NEEDED FOR NAUSEA FOR VOMITING 02/07/20  Yes [name], MD  rosuvastatin (CRESTOR) 40 MG tablet Take 1 tablet (40 mg total) by mouth daily at 6 PM. 10/25/19  Yes Gollan, Kathlene November, MD  traZODone (DESYREL) 100 MG tablet TAKE 1 TO 2 TABLETS BY MOUTH AT BEDTIME 11/15/19  Yes [name], MD  cephALEXin (KEFLEX) 500 MG capsule Take 1 capsule (500 mg total) by mouth 4 (four) times daily for 7 days. 03/13/20 03/20/20  [name], MD  Na Sulfate-K Sulfate-Mg Sulf (SUPREP BOWEL PREP KIT)  17.5-3.13-1.6 GM/177ML SOLN Take 1 kit by mouth as directed. 03/12/20   [name], MD  nitroGLYCERIN (NITROSTAT) 0.4 MG SL tablet Place 1 tablet (0.4 mg total) under the tongue every 5 (five) minutes as needed for chest pain. 10/20/19   Dwyane Dee, MD  UBRELVY 100 MG TABS Take 100 mg by mouth 2 (two) times daily as needed (migraine).  Patient not taking: Reported on 03/14/2020 09/13/19   [provider]    Allergies as of 03/12/2020 - Review Complete 03/12/2020  Allergen Reaction Noted   Lipitor [atorvastatin] Other (See Comments) 04/14/2017   Tramadol Other (See Comments) 01/12/2013    Family History  Problem Relation Age of Onset   Hypertension Mother    Anxiety disorder Mother    Depression Mother    Bipolar disorder  Mother    Heart disease Mother        No details   Hyperlipidemia Mother    Kidney disease Father    Heart disease Father 49   Hypertension Father    Diabetes Father    Stroke Father    Colon cancer Father        dx in his 38's   Anxiety disorder Father    Depression Father    Skin cancer Father    Kidney disease Sister    Thyroid nodules Sister    Hypertension Sister    Hypertension Sister    Diabetes Sister    Hyperlipidemia Sister    Depression Sister    Breast cancer Maternal Aunt 10   Breast cancer Maternal Aunt 48   Ovarian cancer Cousin    Colon cancer Cousin    Kidney cancer Cousin    Breast cancer Other    Bladder Cancer Neg Hx     Social History   Socioeconomic History   Marital status: Divorced    Spouse name: Not on file   Number of children: 1   Years of education: Not on file   Highest education level: High school graduate  Occupational History   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   Lives at home with son   Social Determinants of Health   Financial Resource Strain: Low Risk    Difficulty of Paying Living Expenses: Not very hard  Food Insecurity: No Food Insecurity   Worried About Charity fundraiser in the Last Year: Never true   Arboriculturist in the Last Year: Never true  Transportation Needs: No Transportation Needs   Lack of Transportation (Medical): No   Lack of Transportation (Non-Medical): No  Physical Activity: Inactive   Days of Exercise per Week: 0 days   Minutes of Exercise per Session: 0 min  Stress: Stress Concern Present   Feeling of Stress : Very much  Social Connections: Moderately Isolated   Frequency of Communication with Friends and Family: More than three times a week   Frequency of Social Gatherings with Friends and Family: More than three times a week   Attends Religious Services: More than 4 times per year   Active Member of [employer] or Organizations: No   Attends Archivist Meetings: Never   Marital Status: Divorced  Human resources officer Violence: Not At Risk   Fear of Current or Ex-Partner: No   Emotionally Abused: No   Physically Abused: No   Sexually Abused: No    Review of  Systems: See HPI, otherwise negative ROS  Physical Exam: BP (!) 152/65   Pulse 65   Temp (!) 97.1 F (36.2 C) (Temporal)   Resp 16   Ht '5\' 3"'  (1.6 m)   Wt 69.4 kg   SpO2 100%   BMI 27.10 kg/m   General:   Alert,  pleasant and cooperative in NAD Head:  Normocephalic and atraumatic. Neck:  Supple; no masses or thyromegaly. Lungs:  Clear throughout to auscultation.    Heart:  Regular rate and rhythm. Abdomen:  Soft, nontender and nondistended. Normal bowel sounds, without guarding, and without rebound.   Neurologic:  Alert and  oriented x4;  grossly normal neurologically.  Impression/Plan: [name] is here for an colonoscopy to be performed for family istory of colon cancer in father in 63's  Risks, benefits, limitations, and alternatives regarding  colonoscopy have been reviewed with the patient.  Questions have been answered.  All parties agreeable.   [name], MD  03/14/2020, 9:08 AM

## 2020-03-15 LAB — SURGICAL PATHOLOGY

## 2020-03-16 ENCOUNTER — Encounter: Payer: Self-pay | Admitting: Gastroenterology

## 2020-03-16 LAB — URINE CULTURE

## 2020-03-18 ENCOUNTER — Other Ambulatory Visit: Payer: Self-pay

## 2020-03-18 ENCOUNTER — Telehealth: Payer: Self-pay

## 2020-03-18 DIAGNOSIS — N3000 Acute cystitis without hematuria: Secondary | ICD-10-CM

## 2020-03-18 IMAGING — CT CT HEAD W/O CM
4 series · 16 of 47 positions shown, 18 images · non-contrast
Comparison: MRI 02/17/2018, CT brain 02/17/2018

CLINICAL DATA: Fall with swelling to the forehead

EXAM:
CT HEAD WITHOUT CONTRAST
TECHNIQUE: Contiguous axial images were obtained from the base of the skull
through the vertex without intravenous contrast.

[Series 2: head wo · axial · 0.41mm/px · z∈[-105,-5]mm · 7 of 28 slices shown, 9 images]
[im 4/28  brain]
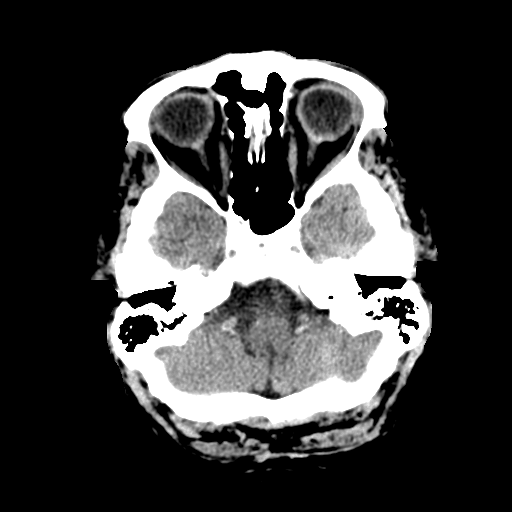
[im 4/28  bone]
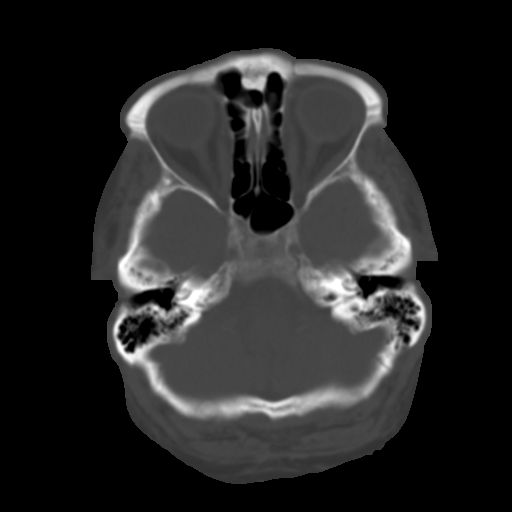
[im 7/28  brain]
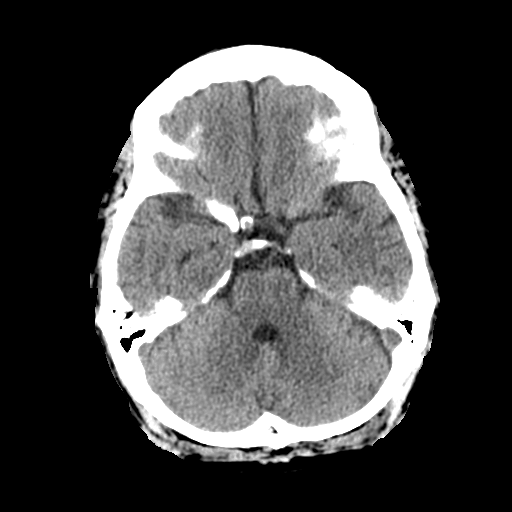
[im 11/28  brain]
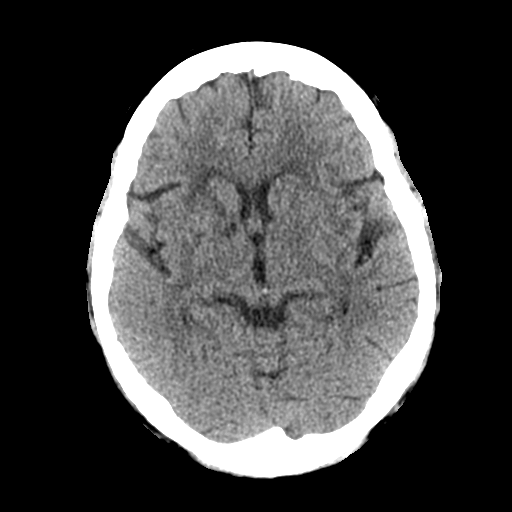
[im 14/28  brain]
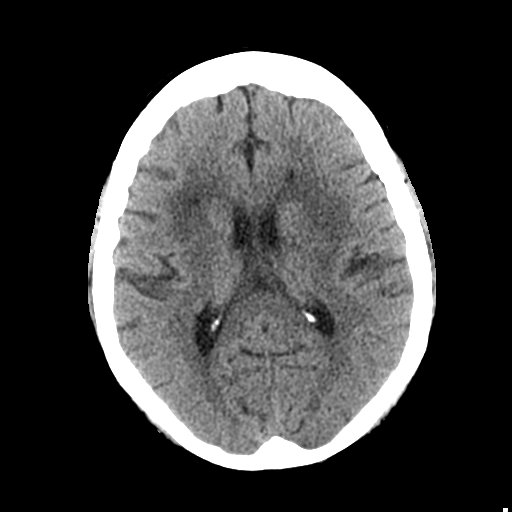
[im 17/28  brain]
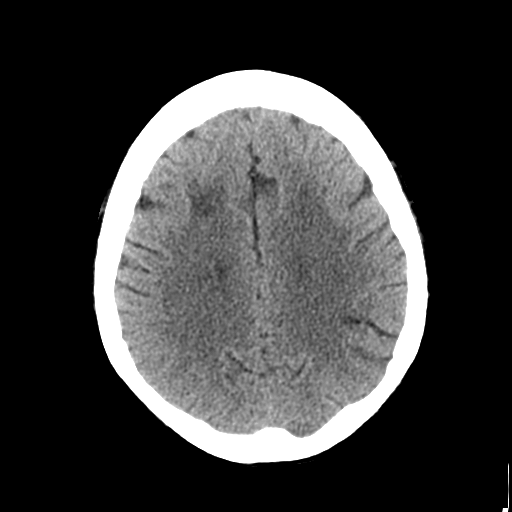
[im 17/28  bone]
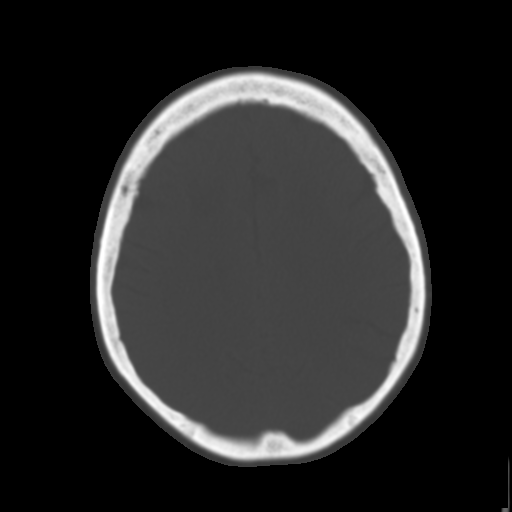
[im 21/28  brain]
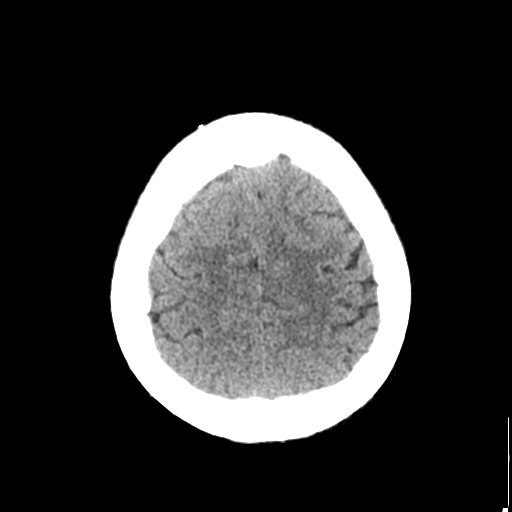
[im 24/28  brain]
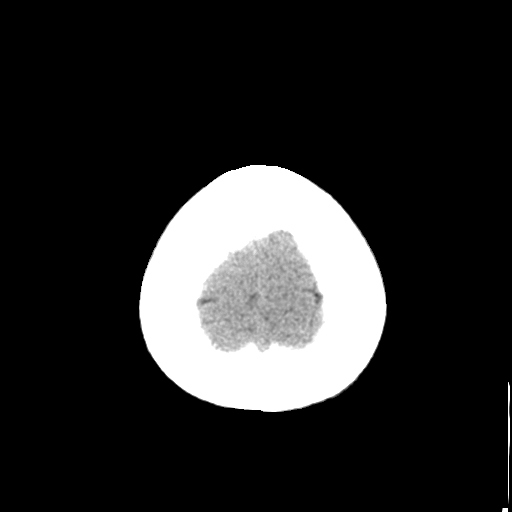

[Series 3: head bone · axial · 0.41mm/px · z∈[-108,-80]mm · 3 of 69 slices shown]
[im 7/69  bone]
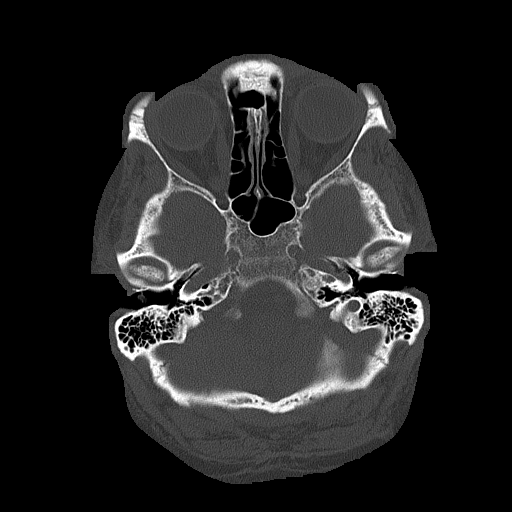
[im 14/69  bone]
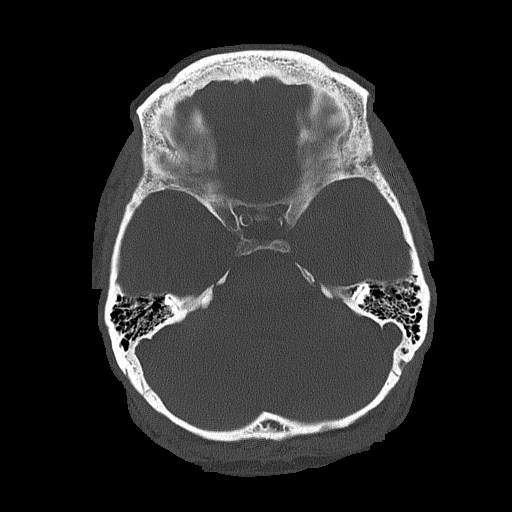
[im 21/69  bone]
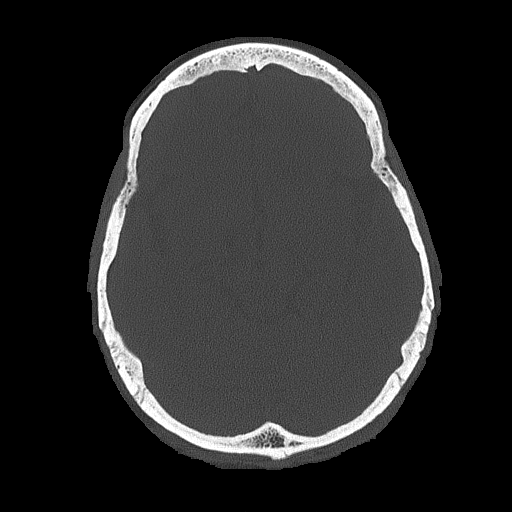

[Series 4: coronal soft tissue · coronal · 0.28mm/px · 3 of 59 slices shown]
[im 20/59  brain]
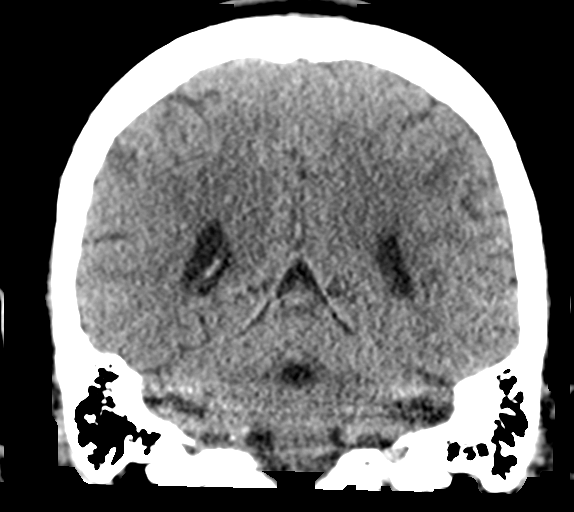
[im 26/59  brain]
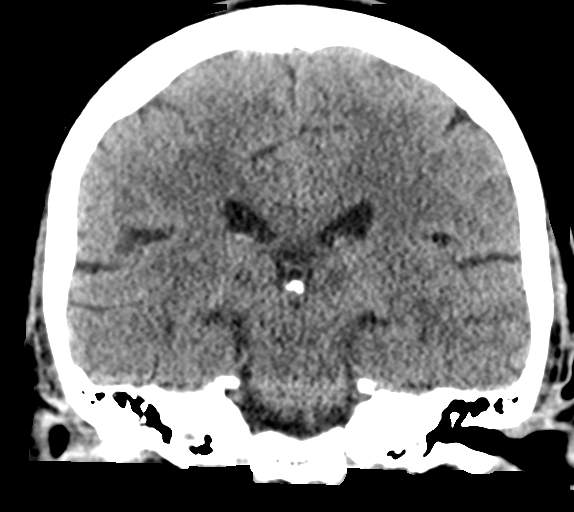
[im 33/59  brain]
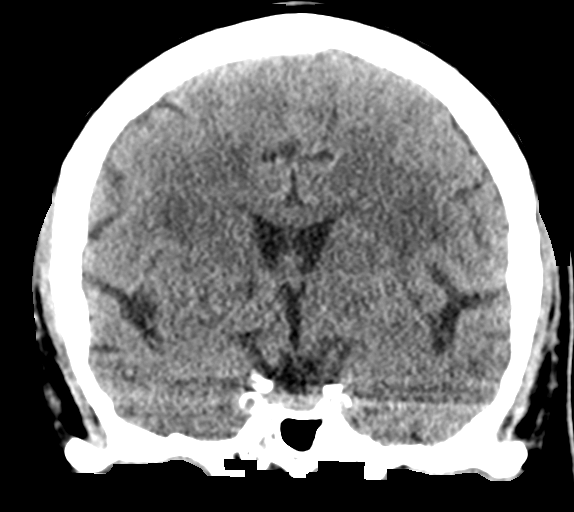

[Series 5: sagittal soft tissue · sagittal · 0.28mm/px · 3 of 52 slices shown]
[im 18/52  brain]
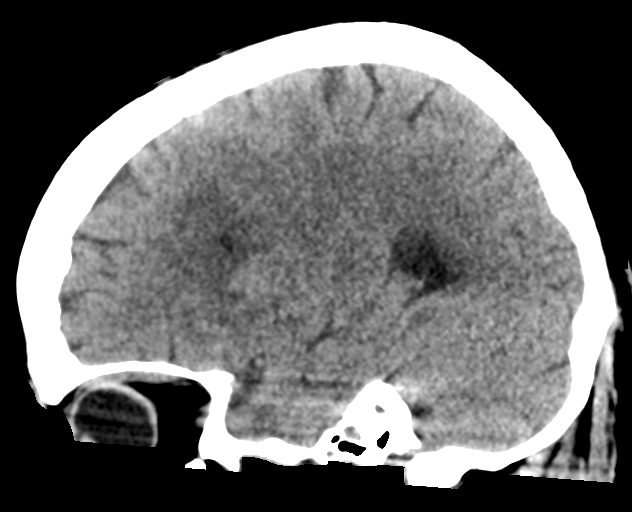
[im 26/52  brain]
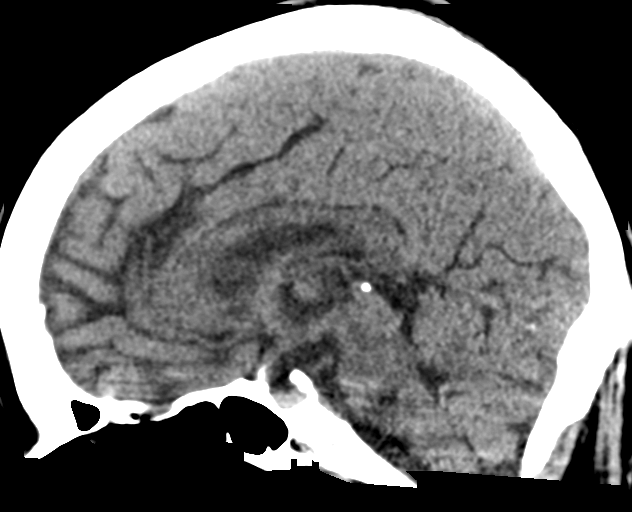
[im 35/52  brain]
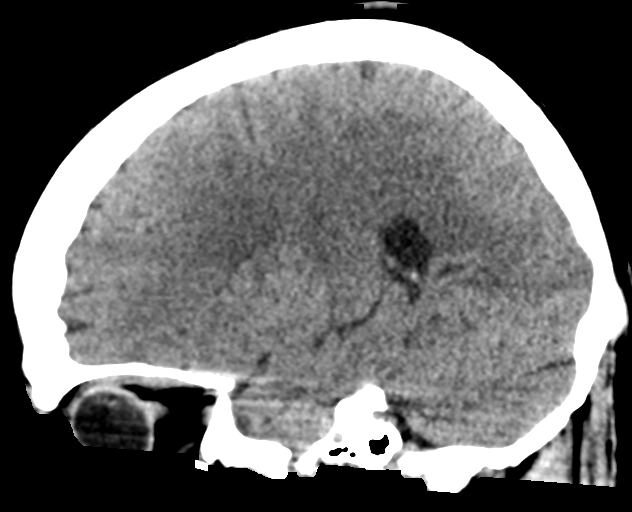

[16 of 47 positions shown; findings below may reference images not displayed]

FINDINGS: Brain: No acute territorial infarction, hemorrhage or intracranial
mass. Stable white matter hypodensity consistent with small vessel
ischemic change. Chronic lacunar infarcts within the right basal
ganglia and bilateral thalami. Chronic lacunar infarct within the
right white matter. Stable ventricle size.

Vascular: No hyperdense vessels.  Carotid vascular calcification

Skull: Normal. Negative for fracture or focal lesion.

Sinuses/Orbits: No acute finding.

Other: None
IMPRESSION: 1. No CT evidence for acute intracranial abnormality.
2. Stable chronic multifocal lacunar infarcts and small vessel
ischemic changes of the white matter

## 2020-03-18 MED ORDER — DOXYCYCLINE HYCLATE 100 MG PO TABS: 100.0000 mg | ORAL_TABLET | Freq: Two times a day (BID) | ORAL | 0 refills | Status: DC

## 2020-03-18 MED ORDER — ALOSETRON HCL 1 MG PO TABS: 1.0000 mg | ORAL_TABLET | Freq: Two times a day (BID) | ORAL | 5 refills | Status: DC

## 2020-03-18 NOTE — Telephone Encounter (Signed)
-----   Message from [name], MD sent at 03/18/2020  8:14 AM EST ----- UTI is resistant to the antibiotic prescribed. Need to change to doxycycline 100mg  twice a day for 7 days. Need to follow up in 10 days to recheck ua and culture

## 2020-03-19 ENCOUNTER — Encounter: Payer: Self-pay | Admitting: *Deleted

## 2020-03-19 DIAGNOSIS — Z955 Presence of coronary angioplasty implant and graft: Secondary | ICD-10-CM

## 2020-03-20 ENCOUNTER — Telehealth: Payer: Self-pay | Admitting: Internal Medicine

## 2020-03-20 MED ORDER — ISOSORBIDE MONONITRATE ER 120 MG PO TB24: 120.0000 mg | ORAL_TABLET | Freq: Every day | ORAL | 6 refills | Status: DC

## 2020-03-20 NOTE — Telephone Encounter (Signed)
Dr. [name],  [place] just saw her few weeks ago.  Did this seem like her demeanor at the time of her visit. I know she's had a lot going on medically.  Thoughts??  Thank you!

## 2020-03-20 NOTE — Telephone Encounter (Signed)
Spoke with patient to schedule fu with Dr. Caryl Comes per recall.   Patient discussed feeling depressed, not wanting to go to rehab anymore, and embarrassed about chronic diarrhea and needing to where depends all the time, and is using a walker all the time.   Patient discussed with son that she may not be around long and is not sure if she is going to have a heart or a kidney issue.    Patient is going to call urology and discuss recent blood work.  Patient is aware nurse will be advised of above.

## 2020-03-22 ENCOUNTER — Encounter: Payer: Self-pay | Admitting: Family Medicine

## 2020-03-25 ENCOUNTER — Other Ambulatory Visit: Payer: Self-pay

## 2020-03-27 ENCOUNTER — Encounter: Payer: Self-pay | Admitting: *Deleted

## 2020-03-27 DIAGNOSIS — Z955 Presence of coronary angioplasty implant and graft: Secondary | ICD-10-CM

## 2020-03-27 NOTE — Progress Notes (Signed)
Cardiac Individual Treatment Plan  Patient Details  Name: [name] MRN: [id number] Date of Birth: [birth year] Referring Provider:   Flowsheet Row Cardiac Rehab from 01/04/2020 in [facility] Cardiac and Pulmonary Rehab  Referring Provider Kathlyn Sacramento MD      Initial Encounter Date:  Flowsheet Row Cardiac Rehab from 01/04/2020 in [facility] Cardiac and Pulmonary Rehab  Date 01/04/20      Visit Diagnosis: Status post coronary artery stent placement  Patient's Home Medications on Admission:  Current Outpatient Medications:  .  alosetron (LOTRONEX) 1 MG tablet, Take 1 tablet (1 mg total) by mouth 2 (two) times daily., Disp: 60 tablet, Rfl: 5 .  ALPRAZolam (XANAX) 1 MG tablet, Take 1 tablet by mouth twice daily as needed, Disp: 60 tablet, Rfl: 5 .  amLODipine (NORVASC) 2.5 MG tablet, Take 1 tablet (2.5 mg total) by mouth daily., Disp: 90 tablet, Rfl: 3 .  buPROPion (WELLBUTRIN SR) 200 MG 12 hr tablet, Take 200 mg by mouth every morning., Disp: , Rfl:  .  carvedilol (COREG) 25 MG tablet, Take 1 tablet (25 mg total) by mouth 2 (two) times daily with a meal., Disp: 180 tablet, Rfl: 3 .  desvenlafaxine (PRISTIQ) 50 MG 24 hr tablet, Take 50 mg by mouth daily., Disp: , Rfl:  .  diphenoxylate-atropine (LOMOTIL) 2.5-0.025 MG tablet, Take 1 tablet by mouth 4 (four) times daily as needed for diarrhea or loose stools., Disp: 120 tablet, Rfl: 3 .  doxycycline (VIBRA-TABS) 100 MG tablet, Take 1 tablet (100 mg total) by mouth 2 (two) times daily., Disp: 20 tablet, Rfl: 0 .  EUTHYROX 25 MCG tablet, TAKE 1 TABLET BY MOUTH ONCE DAILY BEFORE BREAKFAST, Disp: 90 tablet, Rfl: 3 .  famotidine (PEPCID) 20 MG tablet, Take 20 mg by mouth 2 (two) times daily., Disp: , Rfl:  .  gabapentin (NEURONTIN) 300 MG capsule, Take 2 capsules (600 mg total) by mouth 2 (two) times daily., Disp: 360 capsule, Rfl: 4 .  Galcanezumab-gnlm (EMGALITY) 120 MG/ML SOAJ, Inject 120 mg into the skin every 28 (twenty-eight) days., Disp:  , Rfl:  .  isosorbide mononitrate (IMDUR) 120 MG 24 hr tablet, Take 1 tablet (120 mg total) by mouth daily., Disp: 30 tablet, Rfl: 6 .  Na Sulfate-K Sulfate-Mg Sulf (SUPREP BOWEL PREP KIT) 17.5-3.13-1.6 GM/177ML SOLN, Take 1 kit by mouth as directed., Disp: 354 mL, Rfl: 0 .  nitroGLYCERIN (NITROSTAT) 0.4 MG SL tablet, Place 1 tablet (0.4 mg total) under the tongue every 5 (five) minutes as needed for chest pain., Disp: 25 tablet, Rfl: 2 .  Polyethyl Glycol-Propyl Glycol (SYSTANE OP), Place 1 drop into both eyes daily as needed (dry eyes)., Disp: , Rfl:  .  promethazine (PHENERGAN) 25 MG tablet, TAKE 1 TABLET BY MOUTH EVERY 6 HOURS AS NEEDED FOR NAUSEA FOR VOMITING, Disp: 30 tablet, Rfl: 1 .  rosuvastatin (CRESTOR) 40 MG tablet, Take 1 tablet (40 mg total) by mouth daily at 6 PM., Disp: 90 tablet, Rfl: 3 .  traZODone (DESYREL) 100 MG tablet, TAKE 1 TO 2 TABLETS BY MOUTH AT BEDTIME, Disp: 30 tablet, Rfl: 5 .  UBRELVY 100 MG TABS, Take 100 mg by mouth 2 (two) times daily as needed (migraine).  (Patient not taking: Reported on 03/14/2020), Disp: , Rfl:   Past Medical History: Past Medical History:  Diagnosis Date  . Acute colitis 01/27/2017  . Acute pyelonephritis   . Anemia    iron deficiency anemia  . Aortic arch aneurysm ([place])   .  BRCA negative 2014  . CAD (coronary artery disease)    a. 08/2003 Cath: LAD 30-40-med Rx; b. 11/2014 PCI: LAD 86m(3.25x23 Xience Alpine DES); c. 06/2015 PCI: D1 (2.25x12 Resolute Integrity DES); d. 06/2017 PCI: Patent mLAD stent, D2 95 (PTCA); e. 09/2017 PCI: D2 99ost (CBA); d. 12/2017 Cath: LM nl, LAD 344m80d (small), D1 40ost, D2 95ost, LCX 40p, RCA 40ost/p->Med rx for D2 given restenosis.  . Colitis 06/03/2015  . Colon polyp   . CVA (cerebral vascular accident) ([place]   Left side weakness.   . Degenerative tear of glenoid labrum of right shoulder 03/15/2017  . Diabetes mellitus without complication ([facility]  . Family history of breast cancer    BRCA neg 2014  . Femur  fracture, left ([place]/10/2018  . Gastric ulcer 04/27/2011  . History of echocardiogram    a. 03/2017 Echo: EF 60-65%, no rwma; b. 02/2018 Echo: EF 60-65%, no rwma. Nl RV fxn. No cardiac source of emboli (admitted w/ stroke).  . Hypertension   . Malignant melanoma of skin of scalp ([facility]  . MI, acute, non ST segment elevation ([facility]  . Neuromuscular disorder ([facility]  . S/P drug eluting coronary stent placement 06/04/2015  . Sepsis ([facility]/14/2019  . [facility]   a. 02/2018 MRI: 61m41mate acute/early subacute L medial frontal lobe inarct; b. 02/2018 MRA No large vessel occlusion or aneurysm. Mod to sev L P2 stenosis. thready L vertebral artery, diffusely dzs'd; c. 02/2018 Carotid U/S: <50% bilat ICA dzs.    Tobacco Use: Social History   Tobacco Use  Smoking Status Former Smoker  . Packs/day: 0.25  . Years: 1.00  . Pack years: 0.25  . Types: Cigarettes  . Quit date: 08/31/1994  . Years since quitting: 25.5  Smokeless Tobacco Never Used  Tobacco Comment   quit 28 years ago    Labs: Recent Review Flowsheet Data    Labs for ITP Cardiac and Pulmonary Rehab Latest Ref Rng & Units 01/28/2019 04/19/2019 09/03/2019 11/06/2019 01/25/2020   Cholestrol 0 - 200 mg/dL 106 125 - 94 -   LDLCALC 0 - 99 mg/dL 40 76 - 48 -   LDLDIRECT mg/dL - - - - -   HDL >40 mg/dL 56 34(L) - 29(L) -   Trlycerides <150 mg/dL 52 75 - 84 -   Hemoglobin A1c 4.8 - 5.6 % 5.6 5.1 4.8 5.2 5.0   PHART 7.350 - 7.450 - - - - -   PCO2ART 32.0 - 48.0 mmHg - - - - -   HCO3 20.0 - 28.0 mmol/L - - - - -   ACIDBASEDEF 0.0 - 2.0 mmol/L - - - - -   O2SAT % - - - - -       Exercise Target Goals: Exercise Program Goal: Individual exercise prescription set using results from initial 6 min walk test and THRR while considering  patient's activity barriers and safety.   Exercise Prescription Goal: Initial exercise prescription builds to 30-45 minutes a day of aerobic activity, 2-3 days per week.  Home exercise guidelines will be  given to patient during program as part of exercise prescription that the participant will acknowledge.   Education: Aerobic Exercise: - Group verbal and visual presentation on the components of exercise prescription. Introduces F.I.T.T principle from ACSM for exercise prescriptions.  Reviews F.I.T.T. principles of aerobic exercise including progression. Written material given at graduation.   Education: Resistance Exercise: - Group verbal and visual presentation  on the components of exercise prescription. Introduces F.I.T.T principle from ACSM for exercise prescriptions  Reviews F.I.T.T. principles of resistance exercise including progression. Written material given at graduation.    Education: Exercise & Equipment Safety: - Individual verbal instruction and demonstration of equipment use and safety with use of the equipment. Flowsheet Row Cardiac Rehab from 01/25/2020 in [facility] Cardiac and Pulmonary Rehab  Date 12/26/19  Educator [facility]  Instruction Review Code 1- Verbalizes Understanding      Education: Exercise Physiology & General Exercise Guidelines: - Group verbal and written instruction with models to review the exercise physiology of the cardiovascular system and associated critical values. Provides general exercise guidelines with specific guidelines to those with heart or lung disease.    Education: Flexibility, Balance, Mind/Body Relaxation: - Group verbal and visual presentation with interactive activity on the components of exercise prescription. Introduces F.I.T.T principle from ACSM for exercise prescriptions. Reviews F.I.T.T. principles of flexibility and balance exercise training including progression. Also discusses the mind body connection.  Reviews various relaxation techniques to help reduce and manage stress (i.e. Deep breathing, progressive muscle relaxation, and visualization). Balance handout provided to take home. Written material given at graduation.   Activity  Barriers & Risk Stratification:  Activity Barriers & Cardiac Risk Stratification - 01/04/20 1347      Activity Barriers & Cardiac Risk Stratification   Activity Barriers Shortness of Breath;Deconditioning;Muscular Weakness;Other (comment)    Comments Neuropathy, rod in left leg    Cardiac Risk Stratification High           6 Minute Walk:  6 Minute Walk    Row Name 01/04/20 1338         6 Minute Walk   Phase Initial     Distance 805 feet     Walk Time 6 minutes     # of Rest Breaks 0     MPH 1.52     METS 2.79     RPE 13     Perceived Dyspnea  1     VO2 Peak 9.77     Symptoms Yes (comment)     Comments Had anxiety     Resting HR 64 bpm     Resting BP 116/70     Resting Oxygen Saturation  99 %     Exercise Oxygen Saturation  during 6 min walk 96 %     Max Ex. HR 80 bpm     Max Ex. BP 126/68     2 Minute Post BP 110/68            Oxygen Initial Assessment:   Oxygen Re-Evaluation:   Oxygen Discharge (Final Oxygen Re-Evaluation):   Initial Exercise Prescription:  Initial Exercise Prescription - 01/04/20 1300      Date of Initial Exercise RX and Referring Provider   Date 01/04/20    Referring Provider Kathlyn Sacramento MD      Treadmill   MPH 1.3    Grade 0    Minutes 15    METs 2      NuStep   Level 1    SPM 80    Minutes 15    METs 2.7      REL-XR   Level 1    Speed 50    Minutes 15    METs 2.7      Biostep-RELP   Level 1    SPM 50    Minutes 15    METs 2.7      Prescription Details  Frequency (times per week) 2    Duration Progress to 30 minutes of continuous aerobic without signs/symptoms of physical distress      Intensity   THRR 40-80% of Max Heartrate 106-148    Ratings of Perceived Exertion 11-13    Perceived Dyspnea 0-4      Progression   Progression Continue to progress workloads to maintain intensity without signs/symptoms of physical distress.      Resistance Training   Training Prescription Yes    Weight 3 lb     Reps 10-15           Perform Capillary Blood Glucose checks as needed.  Exercise Prescription Changes:  Exercise Prescription Changes    Row Name 01/04/20 1300 01/22/20 1600 03/04/20 1600         Response to Exercise   Blood Pressure (Admit) 116/70 118/78 98/64     Blood Pressure (Exercise) 126/68 120/60 108/68     Blood Pressure (Exit) 110/68 102/62 100/60     Heart Rate (Admit) 64 bpm 75 bpm 82 bpm     Heart Rate (Exercise) 80 bpm 83 bpm 88 bpm     Heart Rate (Exit) 65 bpm 64 bpm 63 bpm     Oxygen Saturation (Admit) 99 % -- --     Oxygen Saturation (Exercise) 95 % -- --     Oxygen Saturation (Exit) 99 % -- --     Rating of Perceived Exertion (Exercise) _0 Perceived Dyspnea (Exercise) 1 -- --     Symptoms anxiety nervous Tors[place] rhythm  sent to MD     Comments walk test results first full day of exercise first day back     Duration -- Progress to 30 minutes of  aerobic without signs/symptoms of physical distress Progress to 30 minutes of  aerobic without signs/symptoms of physical distress     Intensity -- THRR unchanged THRR unchanged           Progression   Progression -- Continue to progress workloads to maintain intensity without signs/symptoms of physical distress. Continue to progress workloads to maintain intensity without signs/symptoms of physical distress.     Average METs -- 1.1 2.4           Resistance Training   Training Prescription -- Yes Yes     Weight -- 3 lb ROM     Reps -- 10-15 10-15           Interval Training   Interval Training -- No No           Treadmill   MPH -- -- 1.9     Grade -- -- 0     Minutes -- -- 15     METs -- -- 2.45           NuStep   Level -- 1 --     Minutes -- 30 --     METs -- 1.1 --           REL-XR   Level -- -- 3     Minutes -- -- 15            Exercise Comments:  Exercise Comments    Row Name 01/11/20 0750 02/27/20 0842         Exercise Comments First full day of exercise!  Patient was  oriented to gym and equipment including functions, settings, policies, and procedures.  Patient's individual exercise prescription and treatment plan were reviewed.  All starting workloads were established based on the results of the 6 minute walk test done at initial orientation visit.  The plan for exercise progression was also introduced and progression will be customized based on patient's performance and goals. Cow[place] did not complete her rehab session.  Tylicia came in with elevated HR  135  Then had a 30-60 second run of v tach.   No symptoms except feeling her heart racing.  She was taken via wheelchair to her cardiology office to be seen by provider in the office.             Exercise Goals and Review:  Exercise Goals    Row Name 01/04/20 1345             Exercise Goals   Increase Physical Activity Yes       Intervention Provide advice, education, support and counseling about physical activity/exercise needs.;Develop an individualized exercise prescription for aerobic and resistive training based on initial evaluation findings, risk stratification, comorbidities and participant's personal goals.       Expected Outcomes Short Term: Attend rehab on a regular basis to increase amount of physical activity.;Long Term: Add in home exercise to make exercise part of routine and to increase amount of physical activity.;Long Term: Exercising regularly at least 3-5 days a week.       Increase Strength and Stamina Yes       Intervention Provide advice, education, support and counseling about physical activity/exercise needs.;Develop an individualized exercise prescription for aerobic and resistive training based on initial evaluation findings, risk stratification, comorbidities and participant's personal goals.       Expected Outcomes Short Term: Increase workloads from initial exercise prescription for resistance, speed, and METs.;Short Term: Perform resistance training exercises  routinely during rehab and add in resistance training at home;Long Term: Improve cardiorespiratory fitness, muscular endurance and strength as measured by increased METs and functional capacity (6MWT)       Able to understand and use rate of perceived exertion (RPE) scale Yes       Intervention Provide education and explanation on how to use RPE scale       Expected Outcomes Short Term: Able to use RPE daily in rehab to express subjective intensity level;Long Term:  Able to use RPE to guide intensity level when exercising independently       Able to understand and use Dyspnea scale Yes       Intervention Provide education and explanation on how to use Dyspnea scale       Expected Outcomes Short Term: Able to use Dyspnea scale daily in rehab to express subjective sense of shortness of breath during exertion;Long Term: Able to use Dyspnea scale to guide intensity level when exercising independently       Knowledge and understanding of Target Heart Rate Range (THRR) Yes       Intervention Provide education and explanation of THRR including how the numbers were predicted and where they are located for reference       Expected Outcomes Short Term: Able to state/look up THRR;Short Term: Able to use daily as guideline for intensity in rehab;Long Term: Able to use THRR to govern intensity when exercising independently       Able to check pulse independently Yes       Intervention Provide education and demonstration on how to check pulse in carotid and radial arteries.;Review the importance of being able to check your own pulse for safety during independent  exercise       Expected Outcomes Short Term: Able to explain why pulse checking is important during independent exercise;Long Term: Able to check pulse independently and accurately       Understanding of Exercise Prescription Yes       Intervention Provide education, explanation, and written materials on patient's individual exercise prescription        Expected Outcomes Short Term: Able to explain program exercise prescription;Long Term: Able to explain home exercise prescription to exercise independently       Improve claudication pain toleration; Improve walking ability Yes              Exercise Goals Re-Evaluation :  Exercise Goals Re-Evaluation    Row Name 01/11/20 0751 01/22/20 1637 02/19/20 1047 03/04/20 1619 03/19/20 0844     Exercise Goal Re-Evaluation   Exercise Goals Review Increase Physical Activity;Able to understand and use rate of perceived exertion (RPE) scale;Knowledge and understanding of Target Heart Rate Range (THRR);Understanding of Exercise Prescription;Increase Strength and Stamina;Able to understand and use Dyspnea scale;Able to check pulse independently Increase Physical Activity;Increase Strength and Stamina;Understanding of Exercise Prescription -- Increase Physical Activity;Increase Strength and Stamina;Understanding of Exercise Prescription --   Comments Reviewed RPE and dyspnea scales, THR and program prescription with pt today.  Pt voiced understanding and was given a copy of goals to take home. Glendoris has completed her first full day of exercise.  We will continue to montior her progress. Out since last review Mahnoor has returned to rehab.  Her first day back she appeared to be in [place] and was sent to office for evaluation and cleared to return.  She did well her next day back.  We will continue to montior her progress. Out since last review   Expected Outcomes Short: Use RPE daily to regulate intensity. Long: Follow program prescription in THR. Short: Attend rehab regularly Long: Continue to follow program prescription -- Short: Continue to attend regularly Long: Continue to improve stamina. --          Discharge Exercise Prescription (Final Exercise Prescription Changes):  Exercise Prescription Changes - 03/04/20 1600      Response to Exercise   Blood Pressure (Admit) 98/64    Blood Pressure (Exercise)  108/68    Blood Pressure (Exit) 100/60    Heart Rate (Admit) 82 bpm    Heart Rate (Exercise) 88 bpm    Heart Rate (Exit) 63 bpm    Rating of Perceived Exertion (Exercise) 13    Symptoms Torsades rhythm   sent to MD   Comments first day back    Duration Progress to 30 minutes of  aerobic without signs/symptoms of physical distress    Intensity THRR unchanged      Progression   Progression Continue to progress workloads to maintain intensity without signs/symptoms of physical distress.    Average METs 2.4      Resistance Training   Training Prescription Yes    Weight ROM    Reps 10-15      Interval Training   Interval Training No      Treadmill   MPH 1.9    Grade 0    Minutes 15    METs 2.45      REL-XR   Level 3    Minutes 15           Nutrition:  Target Goals: Understanding of nutrition guidelines, daily intake of sodium <1556m, cholesterol <2058m calories 30% from fat and 7% or less from  saturated fats, daily to have 5 or more servings of fruits and vegetables.  Education: All About Nutrition: -Group instruction provided by verbal, written material, interactive activities, discussions, models, and posters to present general guidelines for heart healthy nutrition including fat, fiber, MyPlate, the role of sodium in heart healthy nutrition, utilization of the nutrition label, and utilization of this knowledge for meal planning. Follow up email sent as well. Written material given at graduation.   Biometrics:  Pre Biometrics - 01/04/20 1347      Pre Biometrics   Height _0  (1.6 m)    Weight 153 lb 1.6 oz (69.4 kg)    BMI (Calculated) 27.13    Single Leg Stand 8.2 seconds            Nutrition Therapy Plan and Nutrition Goals:  Nutrition Therapy & Goals - 01/08/20 1609      Personal Nutrition Goals   Comments Picture Your Place: 11:  A PYP score greater than 60 likely indicates a healthful dietary pattern, although there may be some specific eating  behaviors that could be improved. Jaquala has 2-3 servings of vegetables per day (green beans written in), 0-1 servings of fruit per day; 2+ servings of dark green vegeabltes, 0-3 servings of red and orange vegetables, 0-10 servings of starchy vegetables per week. She has 1 serving of whole grain breads per day and 1-2 servings of other whole grains per week. She rarely has red meat. She will eat chicken and has fish 1x/week. She has 1-2 servings of beans and 0-1 servings or nuts/nut butter per week. She has either 0 or 4+ servings of low fat dairy and eats natural cheese. She uses soft margarine, does not fry food, uses spray olive oil with baking, and herbs/spices on vegetables. She rarely gets low sodium or no salt added canned items, she will use seasoning packets or sauce 1x/week and canned soups, bouillon, and bottled dressings 1x/week. She does not salt her foods. She will have snacks like chips, crackers, pretzels, popcorn 2-3x/week. She has regular soda 2+x/day and does not drink alcohol.           Nutrition Assessments:  MEDIFICTS Score Key:  ?70 Need to make dietary changes   40-70 Heart Healthy Diet  ? 40 Therapeutic Level Cholesterol Diet  Flowsheet Row Cardiac Rehab from 01/04/2020 in [facility] Cardiac and Pulmonary Rehab  Picture Your Plate Total Score on Admission 67     Picture Your Plate Scores:  <32 Unhealthy dietary pattern with much room for improvement.  41-50 Dietary pattern unlikely to meet recommendations for good health and room for improvement.  51-60 More healthful dietary pattern, with some room for improvement.   >60 Healthy dietary pattern, although there may be some specific behaviors that could be improved.    Nutrition Goals Re-Evaluation:   Nutrition Goals Discharge (Final Nutrition Goals Re-Evaluation):   Psychosocial: Target Goals: Acknowledge presence or absence of significant depression and/or stress, maximize coping skills, provide positive support  system. Participant is able to verbalize types and ability to use techniques and skills needed for reducing stress and depression.   Education: Stress, Anxiety, and Depression - Group verbal and visual presentation to define topics covered.  Reviews how body is impacted by stress, anxiety, and depression.  Also discusses healthy ways to reduce stress and to treat/manage anxiety and depression.  Written material given at graduation. Flowsheet Row Cardiac Rehab from 01/25/2020 in [facility] Cardiac and Pulmonary Rehab  Date 01/11/20  Educator SB  Instruction Review Code 1- Verbalizes Understanding      Education: Sleep Hygiene -Provides group verbal and written instruction about how sleep can affect your health.  Define sleep hygiene, discuss sleep cycles and impact of sleep habits. Review good sleep hygiene tips.    Initial Review & Psychosocial Screening:  Initial Psych Review & Screening - 12/26/19 0843      Initial Review   Current issues with Current Psychotropic Meds;Current Anxiety/Panic;History of Depression;Current Depression;Current Stress Concerns;Current Sleep Concerns    Source of Stress Concerns Chronic Illness;Unable to perform yard/household activities    Comments Her son is a Airline 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 She gets of out breath alot and has chest pains sometimes and needs to lay down. She has chest pain gets depressed and is scared to go out by herself.      Family Dynamics   Good Support System? Yes    Comments She can look to her son and her church family. Dioselina states that she has no one else to look to for support.      Barriers   Psychosocial barriers to participate in program The patient should benefit from training in stress management and relaxation.      Screening Interventions   Interventions To provide support and resources with identified psychosocial needs;Encouraged to exercise;Provide feedback about the scores to participant    Expected Outcomes  Short Term goal: Utilizing psychosocial counselor, staff and physician to assist with identification of specific Stressors or current issues interfering with healing process. Setting desired goal for each stressor or current issue identified.;Long Term Goal: Stressors or current issues are controlled or eliminated.;Short Term goal: Identification and review with participant of any Quality of Life or Depression concerns found by scoring the questionnaire.;Long Term goal: The participant improves quality of Life and PHQ9 Scores as seen by post scores and/or verbalization of changes           Quality of Life Scores:   Quality of Life - 01/04/20 1351      Quality of Life   Select Quality of Life      Quality of Life Scores   Health/Function Pre 5.6 %    Socioeconomic Pre 18.86 %    Psych/Spiritual Pre 5.14 %    Family Pre 15.6 %    GLOBAL Pre 9.71 %          Scores of 19 and below usually indicate a poorer quality of life in these areas.  A difference of  2-3 points is a clinically meaningful difference.  A difference of 2-3 points in the total score of the Quality of Life Index has been associated with significant improvement in overall quality of life, self-image, physical symptoms, and general health in studies assessing change in quality of life.  PHQ-9: Recent Review Flowsheet Data    Depression screen [facility] 2/9 01/04/2020 08/22/2019 08/22/2019   Decreased Interest _0 Down, Depressed, Hopeless _1 PHQ - 2 Score _2 Altered sleeping _3 Tired, decreased energy _4 Change in appetite _5 Feeling bad or failure about yourself  _6 Trouble concentrating _7 Moving slowly or fidgety/restless 3 2 0   Suicidal thoughts 2  0 0   PHQ-9 Score _8 Difficult doing work/chores Very difficult Very difficult Very difficult  Interpretation of Total Score  Total Score Depression Severity:  1-4 = Minimal depression, 5-9 = Mild depression, 10-14 =  Moderate depression, 15-19 = Moderately severe depression, 20-27 = Severe depression   Psychosocial Evaluation and Intervention:  Psychosocial Evaluation - 12/26/19 0846      Psychosocial Evaluation & Interventions   Interventions Encouraged to exercise with the program and follow exercise prescription;Relaxation education;Stress management education    Comments Her son is a Airline 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 She gets of out breath alot and has chest pains sometimes and needs to lay down.    Expected Outcomes Short: Exercise regularly to support mental health and notify staff of any changes. Long: maintain mental health and well being through teaching of rehab or prescribed medications independently.    Continue Psychosocial Services  Follow up required by staff           Psychosocial Re-Evaluation:   Psychosocial Discharge (Final Psychosocial Re-Evaluation):   Vocational Rehabilitation: Provide vocational rehab assistance to qualifying candidates.   Vocational Rehab Evaluation & Intervention:   Education: Education Goals: Education classes will be provided on a variety of topics geared toward better understanding of heart health and risk factor modification. Participant will state understanding/return demonstration of topics presented as noted by education test scores.  Learning Barriers/Preferences:  Learning Barriers/Preferences - 12/26/19 0842      Learning Barriers/Preferences   Learning Barriers None    Learning Preferences None           General Cardiac Education Topics:  AED/CPR: - Group verbal and written instruction with the use of models to demonstrate the basic use of the AED with the basic ABC's of resuscitation.   Anatomy and Cardiac Procedures: - Group verbal and visual presentation and models provide information about basic cardiac anatomy and function. Reviews the testing methods done to diagnose heart disease and the outcomes of the test  results. Describes the treatment choices: Medical Management, Angioplasty, or Coronary Bypass Surgery for treating various heart conditions including Myocardial Infarction, Angina, Valve Disease, and Cardiac Arrhythmias.  Written material given at graduation.   Medication Safety: - Group verbal and visual instruction to review commonly prescribed medications for heart and lung disease. Reviews the medication, class of the drug, and side effects. Includes the steps to properly store meds and maintain the prescription regimen.  Written material given at graduation.   Intimacy: - Group verbal instruction through game format to discuss how heart and lung disease can affect sexual intimacy. Written material given at graduation..   Know Your Numbers and Heart Failure: - Group verbal and visual instruction to discuss disease risk factors for cardiac and pulmonary disease and treatment options.  Reviews associated critical values for Overweight/Obesity, Hypertension, Cholesterol, and Diabetes.  Discusses basics of heart failure: signs/symptoms and treatments.  Introduces Heart Failure Zone chart for action plan for heart failure.  Written material given at graduation.   Infection Prevention: - Provides verbal and written material to individual with discussion of infection control including proper hand washing and proper equipment cleaning during exercise session. Flowsheet Row Cardiac Rehab from 01/25/2020 in [facility] Cardiac and Pulmonary Rehab  Date 12/26/19  Educator [facility]  Instruction Review Code 1- Verbalizes Understanding      Falls Prevention: - Provides verbal and written material to individual with discussion of falls prevention and safety. Flowsheet Row Cardiac Rehab from 01/25/2020 in [facility] Cardiac and Pulmonary Rehab  Date 12/26/19  Educator [facility]  Instruction Review Code 1- Verbalizes Understanding  Other: -Provides group and verbal instruction on various topics (see  comments)   Knowledge Questionnaire Score:  Knowledge Questionnaire Score - 01/04/20 1352      Knowledge Questionnaire Score   Pre Score 19/26: A&P, Angina, PAD, Exercise, Tobacco           Core Components/Risk Factors/Patient Goals at Admission:  Personal Goals and Risk Factors at Admission - 01/04/20 1348      Core Components/Risk Factors/Patient Goals on Admission    Weight Management Yes;Weight Loss    Intervention Weight Management: Develop a combined nutrition and exercise program designed to reach desired caloric intake, while maintaining appropriate intake of nutrient and fiber, sodium and fats, and appropriate energy expenditure required for the weight goal.;Weight Management: Provide education and appropriate resources to help participant work on and attain dietary goals.;Weight Management/Obesity: Establish reasonable short term and long term weight goals.    Admit Weight 153 lb 1.6 oz (69.4 kg)    Goal Weight: Short Term 150 lb (68 kg)    Goal Weight: Long Term 147 lb (66.7 kg)    Expected Outcomes Short Term: Continue to assess and modify interventions until short term weight is achieved;Long Term: Adherence to nutrition and physical activity/exercise program aimed toward attainment of established weight goal;Understanding recommendations for meals to include 15-35% energy as protein, 25-35% energy from fat, 35-60% energy from carbohydrates, less than 236m of dietary cholesterol, 20-35 gm of total fiber daily;Understanding of distribution of calorie intake throughout the day with the consumption of 4-5 meals/snacks;Weight Loss: Understanding of general recommendations for a balanced deficit meal plan, which promotes 1-2 lb weight loss per week and includes a negative energy balance of [phone] kcal/d    Diabetes Yes    Intervention Provide education about signs/symptoms and action to take for hypo/hyperglycemia.;Provide education about proper nutrition, including hydration, and  aerobic/resistive exercise prescription along with prescribed medications to achieve blood glucose in normal ranges: Fasting glucose 65-99 mg/dL    Expected Outcomes Short Term: Participant verbalizes understanding of the signs/symptoms and immediate care of hyper/hypoglycemia, proper foot care and importance of medication, aerobic/resistive exercise and nutrition plan for blood glucose control.;Long Term: Attainment of HbA1C < 7%.    Heart Failure Yes    Intervention Provide a combined exercise and nutrition program that is supplemented with education, support and counseling about heart failure. Directed toward relieving symptoms such as shortness of breath, decreased exercise tolerance, and extremity edema.    Expected Outcomes Improve functional capacity of life;Short term: Attendance in program 2-3 days a week with increased exercise capacity. Reported lower sodium intake. Reported increased fruit and vegetable intake. Reports medication compliance.;Short term: Daily weights obtained and reported for increase. Utilizing diuretic protocols set by physician.;Long term: Adoption of self-care skills and reduction of barriers for early signs and symptoms recognition and intervention leading to self-care maintenance.    Hypertension Yes    Intervention Provide education on lifestyle modifcations including regular physical activity/exercise, weight management, moderate sodium restriction and increased consumption of fresh fruit, vegetables, and low fat dairy, alcohol moderation, and smoking cessation.;Monitor prescription use compliance.    Expected Outcomes Short Term: Continued assessment and intervention until BP is < 140/986mHG in hypertensive participants. < 130/8072mG in hypertensive participants with diabetes, heart failure or chronic kidney disease.;Long Term: Maintenance of blood pressure at goal levels.    Lipids Yes    Intervention Provide education and support for participant on nutrition &  aerobic/resistive exercise along with prescribed medications to achieve LDL <36m89m  HDL >77m.    Expected Outcomes Short Term: Participant states understanding of desired cholesterol values and is compliant with medications prescribed. Participant is following exercise prescription and nutrition guidelines.;Long Term: Cholesterol controlled with medications as prescribed, with individualized exercise RX and with personalized nutrition plan. Value goals: LDL < 765m HDL > 40 mg.    Stress Yes    Intervention Offer individual and/or small group education and counseling on adjustment to heart disease, stress management and health-related lifestyle change. Teach and support self-help strategies.;Refer participants experiencing significant psychosocial distress to appropriate mental health specialists for further evaluation and treatment. When possible, include family members and significant others in education/counseling sessions.    Expected Outcomes Short Term: Participant demonstrates changes in health-related behavior, relaxation and other stress management skills, ability to obtain effective social support, and compliance with psychotropic medications if prescribed.;Long Term: Emotional wellbeing is indicated by absence of clinically significant psychosocial distress or social isolation.           Education:Diabetes - Individual verbal and written instruction to review signs/symptoms of diabetes, desired ranges of glucose level fasting, after meals and with exercise. Acknowledge that pre and post exercise glucose checks will be done for 3 sessions at entry of program. [facility]  Date 12/26/19  [facility] 1- Verbalizes Understanding      Core Components/Risk Factors/Patient Goals Review:    Core Components/Risk Factors/Patient Goals at Discharge (Final Review):    ITP Comments:  ITP Comments     Row Name 12/26/19 0842 01/04/20 1201 01/11/20 0750 01/29/20 1130 01/31/20 0726   ITP Comments Virtual Visit completed. Patient informed on EP and RD appointment and 6 Minute walk test. Patient also informed of patient health questionnaires on My Chart. Patient Verbalizes understanding. [facility] Completed 6MWT and gym orientation. Initial ITP created and sent for review to Dr. MaEmily FilbertMedical Director. First full day of exercise!  Patient was oriented to gym and equipment including functions, settings, policies, and procedures.  Patient's individual exercise prescription and treatment plan were reviewed.  All starting workloads were established based on the results of the 6 minute walk test done at initial orientation visit.  The plan for exercise progression was also introduced and progression will be customized based on patient's performance and goals. Dabria called to inform staff [facility] They are running tests and she said she expects to be there for 10 days. Staff will follow up in two weeks to see how she is feeling. 30 Day review completed. Medical Director ITP review done, changes made as directed, and signed approval by Medical Director.   [place] 02/05/20 1137 02/27/20 0842 02/28/20 0955 03/12/20 0735 03/19/20 0843   ITP Comments Nikitta has been in the hospital for other health concerns and hasn't attended since last review. DaMonaid not complete her rehab session.  Jamiaya came in with elevated HR  135  Then had a 30-60 second run of v tach.   No symptoms except feeling her heart racing.  She was taken via wheelchair to her cardiology office to be seen by provider in the office. 30 Day review completed. Medical Director ITP review done, changes made as directed, and signed approval by Medical Director.  Cardiologist looked at rhythm strips yesterday and determined it was all artifact. She was instructed  to fill her carvedilol RX and start taking. Patient called to let us know that she was just released from the hospital on Saturday from her GI bleed.  They cauterized it and she has a colonoscopy scheduled for Thursday as part of follow up.  She is still feeling very weak.  She will be out this week and next week to recover. Pt cancelled her appt today via MyChart and stated that she did not feel like she could attend at this time.  She is still having some issues with her GI bleed.  Sent her a message back about options, waiting for reply.   [place] Name 03/19/20 1150 03/27/20 0709         ITP Comments Elma is going to go out on medical hold for at least two weeks.  We will reach back out to her on 04/02/20 if we do not hear from her before then. 30 Day review completed. Medical Director ITP review done, changes made as directed, and signed approval by Medical Director.             Comments:

## 2020-03-31 ENCOUNTER — Encounter: Payer: Self-pay | Admitting: Emergency Medicine

## 2020-03-31 ENCOUNTER — Emergency Department
Admission: EM | Admit: 2020-03-31 | Discharge: 2020-03-31 | Disposition: A | Discharge status: Home / Self Care | Payer: Medicare Other | Attending: Emergency Medicine | Admitting: Emergency Medicine

## 2020-03-31 ENCOUNTER — Emergency Department: Payer: Medicare Other

## 2020-03-31 ENCOUNTER — Other Ambulatory Visit: Payer: Self-pay

## 2020-03-31 DIAGNOSIS — M25552 Pain in left hip: Secondary | ICD-10-CM | POA: Diagnosis not present

## 2020-03-31 DIAGNOSIS — R0781 Pleurodynia: Secondary | ICD-10-CM | POA: Diagnosis not present

## 2020-03-31 DIAGNOSIS — N183 Chronic kidney disease, stage 3 unspecified: Secondary | ICD-10-CM | POA: Insufficient documentation

## 2020-03-31 DIAGNOSIS — Z8544 Personal history of malignant neoplasm of other female genital organs: Secondary | ICD-10-CM | POA: Diagnosis not present

## 2020-03-31 DIAGNOSIS — T07XXXA Unspecified multiple injuries, initial encounter: Secondary | ICD-10-CM

## 2020-03-31 DIAGNOSIS — I2510 Atherosclerotic heart disease of native coronary artery without angina pectoris: Secondary | ICD-10-CM | POA: Insufficient documentation

## 2020-03-31 DIAGNOSIS — W19XXXA Unspecified fall, initial encounter: Secondary | ICD-10-CM

## 2020-03-31 DIAGNOSIS — Y9301 Activity, walking, marching and hiking: Secondary | ICD-10-CM | POA: Insufficient documentation

## 2020-03-31 DIAGNOSIS — Y92009 Unspecified place in unspecified non-institutional (private) residence as the place of occurrence of the external cause: Secondary | ICD-10-CM | POA: Insufficient documentation

## 2020-03-31 DIAGNOSIS — T148XXA Other injury of unspecified body region, initial encounter: Secondary | ICD-10-CM | POA: Insufficient documentation

## 2020-03-31 DIAGNOSIS — Z85820 Personal history of malignant melanoma of skin: Secondary | ICD-10-CM | POA: Diagnosis not present

## 2020-03-31 DIAGNOSIS — E039 Hypothyroidism, unspecified: Secondary | ICD-10-CM | POA: Insufficient documentation

## 2020-03-31 DIAGNOSIS — R0789 Other chest pain: Secondary | ICD-10-CM

## 2020-03-31 DIAGNOSIS — M545 Low back pain, unspecified: Secondary | ICD-10-CM

## 2020-03-31 DIAGNOSIS — W01198A Fall on same level from slipping, tripping and stumbling with subsequent striking against other object, initial encounter: Secondary | ICD-10-CM | POA: Diagnosis not present

## 2020-03-31 DIAGNOSIS — R079 Chest pain, unspecified: Secondary | ICD-10-CM

## 2020-03-31 DIAGNOSIS — Z87891 Personal history of nicotine dependence: Secondary | ICD-10-CM | POA: Insufficient documentation

## 2020-03-31 DIAGNOSIS — I130 Hypertensive heart and chronic kidney disease with heart failure and stage 1 through stage 4 chronic kidney disease, or unspecified chronic kidney disease: Secondary | ICD-10-CM | POA: Insufficient documentation

## 2020-03-31 DIAGNOSIS — I5022 Chronic systolic (congestive) heart failure: Secondary | ICD-10-CM | POA: Insufficient documentation

## 2020-03-31 DIAGNOSIS — Z79899 Other long term (current) drug therapy: Secondary | ICD-10-CM | POA: Insufficient documentation

## 2020-03-31 DIAGNOSIS — E1122 Type 2 diabetes mellitus with diabetic chronic kidney disease: Secondary | ICD-10-CM | POA: Insufficient documentation

## 2020-03-31 DIAGNOSIS — R072 Precordial pain: Secondary | ICD-10-CM | POA: Diagnosis not present

## 2020-03-31 MED ORDER — HYDROCODONE-ACETAMINOPHEN 5-325 MG PO TABS: 1.0000 | ORAL_TABLET | Freq: Four times a day (QID) | ORAL | 0 refills | Status: DC | PRN

## 2020-03-31 MED ORDER — HYDROCODONE-ACETAMINOPHEN 5-325 MG PO TABS
1.0000 | ORAL_TABLET | ORAL | Status: AC
Start: 2020-03-31 — End: 2020-03-31
  Administered 2020-03-31: 1 via ORAL
  Filled 2020-03-31: qty 1

## 2020-03-31 NOTE — ED Notes (Signed)
Pt states was dropped off by a friend, will administer pain medication as ordered.

## 2020-03-31 NOTE — ED Notes (Signed)
Pt returned from Xray at this time  

## 2020-03-31 NOTE — ED Notes (Signed)
NAD noted at time of D/C. Pt taken to the lobby to await her ride to arrive to pick her up as patient stated she was dropped off by a friend initially then patient stated her friend took her keys and asked if it would be okay if she left her car until her son could pick her car up but her friend took her keys with her to church. Pt taken to lobby via wheelchair by this RN. Pt then visualized ambulatory to her car and visualized getting into her car and driving away from hospital despite driving precautions being reviewed with patient by this RN prior to leaving the room and being brought to lobby.

## 2020-03-31 NOTE — Discharge Instructions (Addendum)
Please rest and avoid excessive physical activity.  You may apply ice 20 minutes every hour to areas of soreness.  Take Tylenol as needed for mild to moderate pain.  You may use Norco as prescribed for moderate to severe pain.  Please follow-up with primary care provider in 1 week if no improvement.  Return to the ER for any worsening symptoms or urgent changes in your health.

## 2020-03-31 NOTE — ED Triage Notes (Signed)
Pt to ED via POV stating that she was walking down a hill on Friday and fell, pt states that since then she is having soreness in her chest and both sides. Pt is in NAD at this time.

## 2020-03-31 NOTE — ED Notes (Signed)
Pain medication administered as ordered, pt also provided with graham crackers and peanut butter per PA due to patient stating she hasn't eaten to prevent nausea with pain medication. Offered to reposition patient, pt refused. Pt visualized in NAD at this time.

## 2020-03-31 NOTE — ED Notes (Addendum)
Pt to ED via POV, states mechanical fall on Friday down a small hill. Pt c/o bilateral rib pain, states worse when she breathes, pt also c/o L hip pain at this time, states hx of surgery on same hip. Pt A&O x4, denies LOC at this time.

## 2020-03-31 NOTE — ED Provider Notes (Addendum)
[facility] EMERGENCY DEPARTMENT Provider Note   CSN: [id number] Arrival date & time: 03/31/20  4503     History Chief Complaint  Patient presents with  . Fall    Chika Marita [place] [name] is a 50 y.o. female presents to the emergency department for evaluation of a fall that occurred 2 days ago.  Patient states she was going down her back porch, tripped and fell hitting her chest on the ground along with her left hip, left ribs.  She is complaining of sharp sternal chest pain with taking a deep breath.  She has tenderness to touch along the chest.  She denies any shortness of breath.  No radiation of chest pain.  She denies any head injury, LOC, nausea vomiting or neck pain.  She also complains of aching pain in the left hip, left ribs and lower back.  She is able to ambulate but with moderate pain.  She has been taken ibuprofen with little relief.  She denies any weakness in the lower extremities.  No right hip pain.  She denies any abdominal pain.  HPI     Past Medical History:  Diagnosis Date  . Acute colitis 01/27/2017  . Acute pyelonephritis   . Anemia    iron deficiency anemia  . Aortic arch aneurysm ([place])   . BRCA negative 2014  . CAD (coronary artery disease)    a. 08/2003 Cath: LAD 30-40-med Rx; b. 11/2014 PCI: LAD 26m(3.25x23 Xience Alpine DES); c. 06/2015 PCI: D1 (2.25x12 Resolute Integrity DES); d. 06/2017 PCI: Patent mLAD stent, D2 95 (PTCA); e. 09/2017 PCI: D2 99ost (CBA); d. 12/2017 Cath: LM nl, LAD 38m80d (small), D1 40ost, D2 95ost, LCX 40p, RCA 40ost/p->Med rx for D2 given restenosis.  . Colitis 06/03/2015  . Colon polyp   . CVA (cerebral vascular accident) (HCArab   Left side weakness.   . Degenerative tear of glenoid labrum of right shoulder 03/15/2017  . Diabetes mellitus without complication (HCLowry  . Family history of breast cancer    BRCA neg 2014  . Femur fracture, left (HCShelbyville9/10/2018  . Gastric ulcer 04/27/2011  . History of echocardiogram     a. 03/2017 Echo: EF 60-65%, no rwma; b. 02/2018 Echo: EF 60-65%, no rwma. Nl RV fxn. No cardiac source of emboli (admitted w/ stroke).  . Hypertension   . Malignant melanoma of skin of scalp (HCDyersville  . MI, acute, non ST segment elevation (HCMayo  . Neuromuscular disorder (HCAlcona  . S/P drug eluting coronary stent placement 06/04/2015  . Sepsis (HCFort Lu[place]2/14/2019  . [facility]   a. 02/2018 MRI: 22m66mate acute/early subacute L medial frontal lobe inarct; b. 02/2018 MRA No large vessel occlusion or aneurysm. Mod to sev L P2 stenosis. thready L vertebral artery, diffusely dzs'd; c. 02/2018 Carotid U/S: <50% bilat ICA dzs.    Patient Active Problem List   Diagnosis Date Noted  . Family history of malignant neoplasm of gastrointestinal tract   . Iron deficiency anemia secondary to blood loss (chronic)   . Acute on chronic anemia 03/07/2020  . AKI (acute kidney injury) (HCCConcord2/04/2020  . Upper GI bleed   . Near syncope 01/25/2020  . Diabetes mellitus (HCCHarriston2/23/2021  . Dehydration 01/25/2020  . Acute upper GI bleed 01/25/2020  . Pain in limb 01/05/2020  . Pulmonary embolism (HCCBlessing0/06/2019  . Chest pain 11/05/2019  . Type II diabetes mellitus with renal manifestations (HCCPhippsburg0/04/2019  .  Acute renal failure superimposed on stage 3a chronic kidney disease ([place]) 09/02/2019  . Hypoglycemia   . Family history of breast cancer 05/23/2019  . Family history of colon cancer 05/23/2019  . Retinopathy 05/12/2019  . Abnormal LFTs 04/18/2019  . Esophageal dysphagia   . Gastrointestinal tract imaging abnormality   . Trochanteric bursitis, left hip 03/17/2019  . Malignant neoplasm of vulva, unspecified ([place]) 03/10/2019  . Peripheral vascular disease, unspecified ([place]) 03/10/2019  . Paroxysmal supraventricular tachycardia ([place]) 03/10/2019  . Contusion of left hip 03/06/2019  . Chest pain, moderate coronary artery risk 01/27/2019  . Melena   . Recurrent chest pain 01/19/2019  . Depression, major,  single episode, moderate ([place]) 12/09/2018  . Closed nondisplaced intertrochanteric fracture of left femur ([place]) 10/12/2018  . Hypotension 06/26/2018  . Autonomic neuropathy 03/24/2018  . Acute delirium 03/03/2018  . H/O gastric bypass 03/02/2018  . Hypertension associated with stage 3 chronic kidney disease due to type 2 diabetes mellitus ([place]) 02/20/2018  . SI (sacroiliac) joint dysfunction 12/02/2017  . Unstable angina ([place]) 06/24/2017  . Syncope 04/09/2017  . Insomnia 03/18/2017  . Ischemic cardiomyopathy   . Arthritis   . Anxiety   . Tendinitis of upper biceps tendon of right shoulder 03/16/2017  . Gastroenteritis 01/27/2017  . Hx of colonic polyps   . H/O medication noncompliance 12/14/2015  . CAD (coronary artery disease)   . Hypertensive heart disease   . Status post bariatric surgery 06/04/2015  . Carotid stenosis 04/30/2015  . Stable angina pectoris ([place]) 04/17/2015  . Iron deficiency anemia 03/22/2015  . Vitamin B12 deficiency 02/18/2015  . Misuse of medications for pain 02/18/2015  . Major depressive disorder, recurrent, severe with psychotic features ([place]) 02/15/2015  . Helicobacter pylori infection 11/23/2014  . Malignant melanoma ([place]) 08/25/2014  . Chronic systolic CHF (congestive heart failure) ([place])   . Incomplete bladder emptying 07/12/2014  . Hypothyroidism 12/30/2013  . Aberrant subclavian artery 11/17/2013  . Multiple sclerosis ([place]) 11/02/2013  . History of CVA with residual deficit 06/20/2013  . Headache, migraine 05/29/2013  . Hyperlipidemia   . GERD (gastroesophageal reflux disease)   . Neuropathy ([place]) 01/02/2011  . Stroke ([place]) 06/21/2008  . Depression with anxiety 05/01/2008  . Essential hypertension 05/01/2008    Past Surgical History:  Procedure Laterality Date  . APPENDECTOMY    . BALLOON ENTEROSCOPY  02/06/2020   [facility]  . BIOPSY N/A 03/14/2020   Procedure: BIOPSY;  Surgeon: [name], MD;  Location: [facility];  Service:  Endoscopy;  Laterality: N/A;  . CARDIAC CATHETERIZATION N/A 11/09/2014   Procedure: Coronary Angiography;  Surgeon: Minna Merritts, MD;  Location: [place] CV LAB;  Service: Cardiovascular;  Laterality: N/A;  . CARDIAC CATHETERIZATION N/A 11/12/2014   Procedure: Coronary Stent Intervention;  Surgeon: Isaias Cowman, MD;  Location: [place] CV LAB;  Service: Cardiovascular;  Laterality: N/A;  . CARDIAC CATHETERIZATION N/A 04/18/2015   Procedure: Left Heart Cath and Coronary Angiography;  Surgeon: Minna Merritts, MD;  Location: [place] CV LAB;  Service: Cardiovascular;  Laterality: N/A;  . CARDIAC CATHETERIZATION Left 06/04/2015   Procedure: Left Heart Cath and Coronary Angiography;  Surgeon: Wellington Hampshire, MD;  Location: [facility] CV LAB;  Service: Cardiovascular;  Laterality: Left;  . CARDIAC CATHETERIZATION N/A 06/04/2015   Procedure: Coronary Stent Intervention;  Surgeon: Wellington Hampshire, MD;  Location: [place] CV LAB;  Service: Cardiovascular;  Laterality: N/A;  . CESAREAN SECTION  2001  . CHOLECYSTECTOMY N/A 11/18/2016  Procedure: LAPAROSCOPIC CHOLECYSTECTOMY WITH INTRAOPERATIVE CHOLANGIOGRAM;  Surgeon: Christene Lye, MD;  Location: [facility];  Service: General;  Laterality: N/A;  . COLONOSCOPY WITH PROPOFOL N/A 04/27/2016   Procedure: COLONOSCOPY WITH PROPOFOL;  Surgeon: [name], MD;  Location: [facility];  Service: Endoscopy;  Laterality: N/A;  . COLONOSCOPY WITH PROPOFOL N/A 01/12/2018   Procedure: COLONOSCOPY WITH PROPOFOL;  Surgeon: Toledo, Benay Pike, MD;  Location: [facility];  Service: Endoscopy;  Laterality: N/A;  . COLONOSCOPY WITH PROPOFOL N/A 03/14/2020   Procedure: COLONOSCOPY WITH PROPOFOL;  Surgeon: [name], MD;  Location: [facility];  Service: Endoscopy;  Laterality: N/A;  Needs to be scheduled after 7:30 due to driver issues  . CORONARY ANGIOPLASTY    . CORONARY BALLOON ANGIOPLASTY N/A 06/29/2017    Procedure: CORONARY BALLOON ANGIOPLASTY;  Surgeon: Wellington Hampshire, MD;  Location: [place] CV LAB;  Service: Cardiovascular;  Laterality: N/A;  . CORONARY BALLOON ANGIOPLASTY N/A 09/20/2017   Procedure: CORONARY BALLOON ANGIOPLASTY;  Surgeon: Wellington Hampshire, MD;  Location: [place] CV LAB;  Service: Cardiovascular;  Laterality: N/A;  . CORONARY STENT INTERVENTION N/A 12/13/2019   Procedure: CORONARY STENT INTERVENTION;  Surgeon: Wellington Hampshire, MD;  Location: [place] CV LAB;  Service: Cardiovascular;  Laterality: N/A;  . DILATION AND CURETTAGE OF UTERUS    . ESOPHAGOGASTRODUODENOSCOPY (EGD) WITH PROPOFOL N/A 09/14/2014   Procedure: ESOPHAGOGASTRODUODENOSCOPY (EGD) WITH PROPOFOL;  Surgeon: Josefine Class, MD;  [facility];  Service: Endoscopy;  Laterality: N/A;  . ESOPHAGOGASTRODUODENOSCOPY (EGD) WITH PROPOFOL N/A 04/27/2016   Procedure: ESOPHAGOGASTRODUODENOSCOPY (EGD) WITH PROPOFOL;  Surgeon: [name], MD;  Location: [facility];  Service: Endoscopy;  Laterality: N/A;  Diabetic - oral meds  . ESOPHAGOGASTRODUODENOSCOPY (EGD) WITH PROPOFOL N/A 01/12/2018   Procedure: ESOPHAGOGASTRODUODENOSCOPY (EGD) WITH PROPOFOL;  Surgeon: Toledo, Benay Pike, MD;  Location: [facility] ENDOSCOPY;  Service: Endoscopy;  Laterality: N/A;  . ESOPHAGOGASTRODUODENOSCOPY (EGD) WITH PROPOFOL N/A 04/11/2019   Procedure: ESOPHAGOGASTRODUODENOSCOPY (EGD) WITH PROPOFOL;  Surgeon: [name], MD;  Location: [facility] ENDOSCOPY;  Service: Endoscopy;  Laterality: N/A;  . ESOPHAGOGASTRODUODENOSCOPY (EGD) WITH PROPOFOL N/A 03/08/2020   Procedure: ESOPHAGOGASTRODUODENOSCOPY (EGD) WITH PROPOFOL;  Surgeon: [name], MD;  [facility];  Service: Endoscopy;  Laterality: N/A;  . GASTRIC BYPASS  09/2009   [place] (IM) NAIL INTERTROCHANTERIC Left 10/13/2018   Procedure: INTRAMEDULLARY (IM) NAIL INTERTROCHANTRIC;  Surgeon: Leandrew Koyanagi, MD;  Location:  [place];  Service: Orthopedics;  Laterality: Left;  . Left Carotid to sublcavian artery bypass w/ subclavian artery ligation     a. Performed @ [place].  . LEFT HEART CATH AND CORONARY ANGIOGRAPHY Left 06/29/2017   Procedure: LEFT HEART CATH AND CORONARY ANGIOGRAPHY;  Surgeon: Wellington Hampshire, MD;  Location: [place] CV LAB;  Service: Cardiovascular;  Laterality: Left;  . LEFT HEART CATH AND CORONARY ANGIOGRAPHY N/A 09/20/2017   Procedure: LEFT HEART CATH AND CORONARY ANGIOGRAPHY;  Surgeon: Wellington Hampshire, MD;  Location: [place] CV LAB;  Service: Cardiovascular;  Laterality: N/A;  . LEFT HEART CATH AND CORONARY ANGIOGRAPHY N/A 12/20/2017   Procedure: LEFT HEART CATH AND CORONARY ANGIOGRAPHY;  Surgeon: Wellington Hampshire, MD;  Location: [place] CV LAB;  Service: Cardiovascular;  Laterality: N/A;  . LEFT HEART CATH AND CORONARY ANGIOGRAPHY N/A 04/20/2019   Procedure: LEFT HEART CATH AND CORONARY ANGIOGRAPHY possible PCI;  Surgeon: Minna Merritts, MD;  Location: [place] CV LAB;  Service: Cardiovascular;  Laterality: N/A;  . LEFT HEART CATH AND CORONARY ANGIOGRAPHY N/A 12/13/2019   Procedure: LEFT HEART CATH AND CORONARY ANGIOGRAPHY;  Surgeon: Wellington Hampshire, MD;  Location: [place] CV LAB;  Service: Cardiovascular;  Laterality: N/A;  . MELANOMA EXCISION  2016   Dr. Evorn Gong  . [place]  2002  . RIGHT OOPHORECTOMY    . SHOULDER ARTHROSCOPY WITH OPEN ROTATOR CUFF REPAIR Right 01/07/2016   Procedure: SHOULDER ARTHROSCOPY WITH DEBRIDMENT, SUBACHROMIAL DECOMPRESSION;  Surgeon: Corky Mull, MD;  Location: [facility];  Service: Orthopedics;  Laterality: Right;  . SHOULDER ARTHROSCOPY WITH OPEN ROTATOR CUFF REPAIR Right 03/16/2017   Procedure: SHOULDER ARTHROSCOPY WITH OPEN ROTATOR CUFF REPAIR POSSIBLE BICEPS TENODESIS;  Surgeon: Corky Mull, MD;  Location: [facility];  Service: Orthopedics;  Laterality: Right;  . TRIGGER FINGER RELEASE Right     [place]     OB  History    Gravida  7   Para  1   Term      Preterm  1   AB  6   Living  1     SAB  6   IAB      Ectopic      Multiple      Live Births  1           Family History  Problem Relation Age of Onset  . Hypertension Mother   . Anxiety disorder Mother   . Depression Mother   . Bipolar disorder Mother   . Heart disease Mother        No details  . Hyperlipidemia Mother   . Kidney disease Father   . Heart disease Father 67  . Hypertension Father   . Diabetes Father   . Stroke Father   . Colon cancer Father        dx in his 93's  . Anxiety disorder Father   . Depression Father   . Skin cancer Father   . Kidney disease Sister   . Thyroid nodules Sister   . Hypertension Sister   . Hypertension Sister   . Diabetes Sister   . Hyperlipidemia Sister   . Depression Sister   . Breast cancer Maternal Aunt 42  . Breast cancer Maternal Aunt 51  . Ovarian cancer Cousin   . Colon cancer Cousin   . Kidney cancer Cousin   . Breast cancer Other   . Bladder Cancer Neg Hx     Social History   Tobacco Use  . Smoking status: Former Smoker    Packs/day: 0.25    Years: 1.00    Pack years: 0.25    Types: Cigarettes    Quit date: 08/31/1994    Years since quitting: 25.6  . Smokeless tobacco: Never Used  . Tobacco comment: quit 28 years ago  Vaping Use  . Vaping Use: Never used  Substance Use Topics  . Alcohol use: No    Alcohol/week: 0.0 standard drinks  . Drug use: No    Home Medications Prior to Admission medications   Medication Sig Start Date End Date Taking? Authorizing Provider  alosetron (LOTRONEX) 1 MG tablet Take 1 tablet (1 mg total) by mouth 2 (two) times daily. 03/18/20   [name], MD  ALPRAZolam Duanne Moron) 1 MG tablet Take 1 tablet by mouth twice daily as needed 02/16/20   [name], MD  amLODipine (NORVASC) 2.5 MG tablet Take 1 tablet (2.5 mg total) by mouth daily. 10/25/19   Minna Merritts,  MD  buPROPion (WELLBUTRIN SR) 200 MG 12 hr tablet  Take 200 mg by mouth every morning. 11/17/19   [provider]  carvedilol (COREG) 25 MG tablet Take 1 tablet (25 mg total) by mouth 2 (two) times daily with a meal. 02/27/20   Gollan, Kathlene November, MD  desvenlafaxine (PRISTIQ) 50 MG 24 hr tablet Take 50 mg by mouth daily. 12/11/19   [provider]  diphenoxylate-atropine (LOMOTIL) 2.5-0.025 MG tablet Take 1 tablet by mouth 4 (four) times daily as needed for diarrhea or loose stools. 01/29/20   [name], MD  doxycycline (VIBRA-TABS) 100 MG tablet Take 1 tablet (100 mg total) by mouth 2 (two) times daily. 03/18/20   [name], MD  EUTHYROX 25 MCG tablet TAKE 1 TABLET BY MOUTH ONCE DAILY BEFORE BREAKFAST 12/02/19   [name], MD  famotidine (PEPCID) 20 MG tablet Take 20 mg by mouth 2 (two) times daily.    [provider]  gabapentin (NEURONTIN) 300 MG capsule Take 2 capsules (600 mg total) by mouth 2 (two) times daily. 01/17/20   [name], MD  Galcanezumab-gnlm (EMGALITY) 120 MG/ML SOAJ Inject 120 mg into the skin every 28 (twenty-eight) days. 03/01/20   [provider]  isosorbide mononitrate (IMDUR) 120 MG 24 hr tablet Take 1 tablet (120 mg total) by mouth daily. 03/20/20   Minna Merritts, MD  Na Sulfate-K Sulfate-Mg Sulf (SUPREP BOWEL PREP KIT) 17.5-3.13-1.6 GM/177ML SOLN Take 1 kit by mouth as directed. 03/12/20   [name], MD  nitroGLYCERIN (NITROSTAT) 0.4 MG SL tablet Place 1 tablet (0.4 mg total) under the tongue every 5 (five) minutes as needed for chest pain. 10/20/19   Dwyane Dee, MD  Polyethyl Glycol-Propyl Glycol (SYSTANE OP) Place 1 drop into both eyes daily as needed (dry eyes).    [provider]  promethazine (PHENERGAN) 25 MG tablet TAKE 1 TABLET BY MOUTH EVERY 6 HOURS AS NEEDED FOR NAUSEA FOR VOMITING 02/07/20   [name], MD  rosuvastatin (CRESTOR) 40 MG tablet Take 1 tablet (40 mg total) by mouth daily at 6 PM. 10/25/19   [name], Kathlene November, MD  traZODone  (DESYREL) 100 MG tablet TAKE 1 TO 2 TABLETS BY MOUTH AT BEDTIME 11/15/19   [name], MD  UBRELVY 100 MG TABS Take 100 mg by mouth 2 (two) times daily as needed (migraine).  Patient not taking: Reported on 03/14/2020 09/13/19   [provider]    Allergies    Lipitor [atorvastatin] and Tramadol  Review of Systems   Review of Systems  Constitutional: Negative for activity change.  Eyes: Negative for pain and visual disturbance.  Respiratory: Negative for shortness of breath.   Cardiovascular: Positive for chest pain. Negative for leg swelling.  Gastrointestinal: Negative for abdominal pain.  Genitourinary: Negative for flank pain and pelvic pain.  Musculoskeletal: Positive for arthralgias, back pain and myalgias. Negative for gait problem, joint swelling, neck pain and neck stiffness.  Skin: Negative for wound.  Neurological: Negative for dizziness, syncope, weakness, light-headedness, numbness and headaches.  Psychiatric/Behavioral: Negative for confusion and decreased concentration.    Physical Exam Updated Vital Signs BP (!) 187/101 (BP Location: Left Arm)   Pulse 76   Temp 97.7 F (36.5 C) (Oral)   Resp 16   Ht '5\' 3"'  (1.6 m)   Wt 67.1 kg   SpO2 98%   BMI 26.22 kg/m   Physical Exam Constitutional:      Appearance: She is well-developed  and well-nourished.  HENT:     Head: Normocephalic and atraumatic.     Right Ear: External ear normal.     Left Ear: External ear normal.     Nose: Nose normal.  Eyes:     Extraocular Movements: EOM normal.     Conjunctiva/sclera: Conjunctivae normal.     Pupils: Pupils are equal, round, and reactive to light.  Cardiovascular:     Rate and Rhythm: Normal rate.  Pulmonary:     Effort: Pulmonary effort is normal. No respiratory distress.     Breath sounds: Normal breath sounds.  Chest:     Chest wall: Tenderness (No bruising, positive tenderness to palpation along the sternum.) present.  Abdominal:     General: There  is no distension.     Palpations: Abdomen is soft.     Tenderness: There is no abdominal tenderness. There is no guarding.  Musculoskeletal:        General: No deformity or edema. Normal range of motion.     Cervical back: Normal range of motion.     Comments: No cervical spinous process tenderness. Normal range of motion of both hips.  Mild pain with left hip internal rotation and tenderness along the left hip greater troches region.  Patient is tender along the lower lumbar spinous process as well as the left ribs.  No step-off noted.  Patient able to stand and ambulate with very little antalgic gait.  She is neurovascular tact in bilateral lower extremities.  She is able to straight leg raise bilateral lower extremities with normal ankle plantar flexion dorsiflexion.  Skin:    General: Skin is warm and dry.     Findings: No rash.  Neurological:     Mental Status: She is alert and oriented to person, place, and time.     Cranial Nerves: No cranial nerve deficit.     Coordination: Coordination normal.  Psychiatric:        Mood and Affect: Mood and affect normal.        Behavior: Behavior normal.     ED Results / Procedures / Treatments   Labs (all labs ordered are listed, but only abnormal results are displayed) Labs Reviewed - No data to display  EKG None  Radiology DG Chest 2 View  Result Date: 03/31/2020 CLINICAL DATA:  Chest pain after fall down hill on Friday. EXAM: CHEST - 2 VIEW COMPARISON:  Chest radiograph December 12, 2019 FINDINGS: The heart size and mediastinal contours are unchanged. Right-sided aortic arch on vascular ring better characterized on prior CT chest. Surgical clips project in the region of the left thoracic inlet. Both lungs are clear. The visualized skeletal structures are unchanged. IMPRESSION: No acute cardiopulmonary disease. Electronically Signed   By: Dahlia Bailiff MD   On: 03/31/2020 09:10   DG Ribs Unilateral Right  Result Date:  03/31/2020 CLINICAL DATA:  Pain after fall down hill on Friday EXAM: RIGHT RIBS - 2 VIEW COMPARISON:  Chest radiograph December 12, 2019 FINDINGS: No displaced fracture or other bone lesions are seen involving the ribs. Cholecystectomy clips. Surgical clips project over the left thoracic inlet. IMPRESSION: No displaced rib fracture visualized. Electronically Signed   By: Dahlia Bailiff MD   On: 03/31/2020 09:15   DG Lumbar Spine 2-3 Views  Result Date: 03/31/2020 CLINICAL DATA:  Pain after fall down hill on Friday EXAM: LUMBAR SPINE - 2-3 VIEW COMPARISON:  Lumbar spine radiographs November 04, 2017 FINDINGS: There is no evidence of lumbar  spine fracture. Alignment is normal. Similar mild degenerative change. Cholecystectomy clips. Surgical clip overlies the left lower quadrant. IMPRESSION: No acute osseous abnormality. Mild degenerative changes, similar to prior studies Electronically Signed   By: Dahlia Bailiff MD   On: 03/31/2020 09:14   DG HIP UNILAT WITH PELVIS 2-3 VIEWS LEFT  Result Date: 03/31/2020 CLINICAL DATA:  Pain after fall down hill on Friday EXAM: DG HIP (WITH OR WITHOUT PELVIS) 2-3V LEFT COMPARISON:  Femur radiograph January 10, 2019. FINDINGS: Partially visualized intramedullary rod and nail fixation of the femur. There is no evidence of hip fracture or dislocation. There is no evidence of arthropathy or other focal bone abnormality. Pelvic phleboliths. Vascular calcifications. Surgical clip overlies the left lower quadrant. IMPRESSION: No acute osseous abnormality. Electronically Signed   By: Dahlia Bailiff MD   On: 03/31/2020 09:12    Procedures Procedures   Medications Ordered in ED Medications  HYDROcodone-acetaminophen (NORCO/VICODIN) 5-325 MG per tablet 1 tablet (1 tablet Oral Given 03/31/20 0846)    ED Course  I have reviewed the triage vital signs and the nursing notes.  Pertinent labs & imaging results that were available during my care of the patient were reviewed by me  and considered in my medical decision making (see chart for details).    MDM Rules/Calculators/A&P                          50 year old female with fall 2 days ago.  She has multiple areas of pain along the lower back, left hip, anterior chest wall and ribs.  X-ray showed no evidence of acute bony abnormality.  History and exam findings consistent with musculoskeletal pain.  EKG showed normal sinus rhythm.  Heart rate 78.  Discussed treatment options with patient.  She understands signs and symptoms return to the ER for.  She will follow up with primary care provider in 1 week if no improvement. Final Clinical Impression(s) / ED Diagnoses Final diagnoses:  Fall, initial encounter  Chest wall pain  Rib pain on left side  Left hip pain  Acute midline low back pain without sciatica  Chest pain  Multiple contusions    Rx / DC Orders ED Discharge Orders    None       Duanne Guess, PA-C 03/31/20 0919    Duanne Guess, PA-C 03/31/20 1105    Harvest Dark, MD 04/06/20 2234

## 2020-04-01 ENCOUNTER — Telehealth: Payer: Self-pay

## 2020-04-01 NOTE — Telephone Encounter (Signed)
Copied from [place] [id number]. Topic: General - Other >> Apr 01, 2020  4:09 PM Pawlus, Brayton Layman A wrote: Reason for CRM: Caller wanted to let Dr Caryn Section know that the patient had a fall and has been in the emergency room. Caller stated the patient had a lot of pain from this recent fall. FYI

## 2020-04-02 NOTE — Telephone Encounter (Signed)
FYI: Patient already has a uti follow up appointment scheduled for tomorrow.

## 2020-04-02 NOTE — Progress Notes (Deleted)
Established patient visit   Patient: [name]   DOB: 1970/09/15   50 y.o. Female  MRN: [id number] Visit Date: 04/03/2020  Today's healthcare provider: [name], MD   No chief complaint on file.  Subjective    HPI  Follow up ER visit  Patient was seen in ER for fall on 03/31/2020. She was treated for ***. Treatment for this included ***. She reports {DESC; EXCELLENT/GOOD/FAIR:19992} compliance with treatment. She reports this condition is {improved/worse/unchanged:3041574}.  -----------------------------------------------------------------------------------------   {Show patient history (optional):23778::" "}   Medications: Outpatient Medications Prior to Visit  Medication Sig  . alosetron (LOTRONEX) 1 MG tablet Take 1 tablet (1 mg total) by mouth 2 (two) times daily.  Marland Kitchen ALPRAZolam (XANAX) 1 MG tablet Take 1 tablet by mouth twice daily as needed  . amLODipine (NORVASC) 2.5 MG tablet Take 1 tablet (2.5 mg total) by mouth daily.  Marland Kitchen buPROPion (WELLBUTRIN SR) 200 MG 12 hr tablet Take 200 mg by mouth every morning.  . carvedilol (COREG) 25 MG tablet Take 1 tablet (25 mg total) by mouth 2 (two) times daily with a meal.  . desvenlafaxine (PRISTIQ) 50 MG 24 hr tablet Take 50 mg by mouth daily.  . diphenoxylate-atropine (LOMOTIL) 2.5-0.025 MG tablet Take 1 tablet by mouth 4 (four) times daily as needed for diarrhea or loose stools.  Marland Kitchen doxycycline (VIBRA-TABS) 100 MG tablet Take 1 tablet (100 mg total) by mouth 2 (two) times daily.  Arna Medici 25 MCG tablet TAKE 1 TABLET BY MOUTH ONCE DAILY BEFORE BREAKFAST  . famotidine (PEPCID) 20 MG tablet Take 20 mg by mouth 2 (two) times daily.  Marland Kitchen gabapentin (NEURONTIN) 300 MG capsule Take 2 capsules (600 mg total) by mouth 2 (two) times daily.  . Galcanezumab-gnlm (EMGALITY) 120 MG/ML SOAJ Inject 120 mg into the skin every 28 (twenty-eight) days.  Marland Kitchen HYDROcodone-acetaminophen (NORCO) 5-325 MG tablet Take 1 tablet by mouth  every 6 (six) hours as needed for moderate pain.  . isosorbide mononitrate (IMDUR) 120 MG 24 hr tablet Take 1 tablet (120 mg total) by mouth daily.  . Na Sulfate-K Sulfate-Mg Sulf (SUPREP BOWEL PREP KIT) 17.5-3.13-1.6 GM/177ML SOLN Take 1 kit by mouth as directed.  . nitroGLYCERIN (NITROSTAT) 0.4 MG SL tablet Place 1 tablet (0.4 mg total) under the tongue every 5 (five) minutes as needed for chest pain.  Vladimir Faster Glycol-Propyl Glycol (SYSTANE OP) Place 1 drop into both eyes daily as needed (dry eyes).  . promethazine (PHENERGAN) 25 MG tablet TAKE 1 TABLET BY MOUTH EVERY 6 HOURS AS NEEDED FOR NAUSEA FOR VOMITING  . rosuvastatin (CRESTOR) 40 MG tablet Take 1 tablet (40 mg total) by mouth daily at 6 PM.  . traZODone (DESYREL) 100 MG tablet TAKE 1 TO 2 TABLETS BY MOUTH AT BEDTIME  . UBRELVY 100 MG TABS Take 100 mg by mouth 2 (two) times daily as needed (migraine).  (Patient not taking: Reported on 03/14/2020)   No facility-administered medications prior to visit.    Review of Systems  {Labs  Heme  Chem  Endocrine  Serology  Results Review (optional):23779::" "}   Objective    There were no vitals taken for this visit. {Show previous vital signs (optional):23777::" "}   Physical Exam  ***  No results found for any visits on 04/03/20.  Assessment & Plan     ***  No follow-ups on file.      {provider attestation***:1}   [name], MD  [facility]  Practice [phone] (phone) [phone] (fax)  [place]

## 2020-04-03 ENCOUNTER — Encounter: Payer: Self-pay | Admitting: Family Medicine

## 2020-04-03 ENCOUNTER — Telehealth: Payer: Self-pay

## 2020-04-03 ENCOUNTER — Ambulatory Visit: Payer: Self-pay | Admitting: Family Medicine

## 2020-04-03 NOTE — Telephone Encounter (Signed)
LMOM

## 2020-04-06 NOTE — Progress Notes (Deleted)
[place]  [phone] [phone]  ID: Sinda Du OB: 04/26/1970  MR#: [id number]  POE#:[id number]  Patient Care Team: [name], MD as PCP - General (Family Medicine) [name] Kathlene November, MD as PCP - Cardiology (Cardiology) Vladimir Crofts, MD as Consulting Physician (Neurology) Anthonette Legato, MD as Consulting Physician (Nephrology) Lloyd Huger, MD as Consulting Physician (Oncology) Poggi, Marshall Cork, MD as Consulting Physician (Orthopedic Surgery) Warnell Forester, NP as Nurse Practitioner (Endocrinology) Caroline More, DPM as Consulting Physician (Podiatry) Copland, Ginette Otto as Referring Physician (Obstetrics and Gynecology) Danice Goltz, MD as Consulting Physician (Ophthalmology) Carloyn Manner, MD as Referring Physician (Otolaryngology)   CHIEF COMPLAINT: Iron deficiency anemia.  INTERVAL HISTORY: Patient agreed to video enabled telemedicine visit for further evaluation and discussion of her laboratory results.  She currently feels well and is asymptomatic.  She does not complain of any weakness or fatigue. She has no neurologic complaints.  She denies any recent fevers or illnesses.  She has a good appetite and denies weight loss.  She has no chest pain, shortness of breath, cough, or hemoptysis.  She denies any nausea, vomiting, constipation, or diarrhea.  She has no melena or hematochezia.  She has no urinary complaints.  Patient offers no specific complaints today.  REVIEW OF SYSTEMS:   Review of Systems  Constitutional: Negative.  Negative for fever, malaise/fatigue and weight loss.  Respiratory: Negative.  Negative for cough and shortness of breath.   Cardiovascular: Negative.  Negative for chest pain, palpitations and leg swelling.  Gastrointestinal: Negative.  Negative for blood in stool, diarrhea and melena.  Genitourinary: Negative.  Negative for hematuria.  Musculoskeletal: Negative.   Negative for back pain.  Skin: Negative.  Negative for rash.  Neurological: Negative.  Negative for dizziness, sensory change, focal weakness, weakness and headaches.  Psychiatric/Behavioral: Negative.  The patient is not nervous/anxious.     As per HPI. Otherwise, a complete review of systems is negative.  PAST MEDICAL HISTORY: Past Medical History:  Diagnosis Date  . Acute colitis 01/27/2017  . Acute pyelonephritis   . Anemia    iron deficiency anemia  . 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 BRCA negative 2014  . CAD (coronary artery disease)    a. 08/2003 Cath: LAD 30-40-med Rx; b. 11/2014 PCI: LAD 8m(3.25x23 Xience Alpine DES); c. 06/2015 PCI: D1 (2.25x12 Resolute Integrity DES); d. 06/2017 PCI: Patent mLAD stent, D2 95 (PTCA); e. 09/2017 PCI: D2 99ost (CBA); d. 12/2017 Cath: LM nl, LAD 330m80d (small), D1 40ost, D2 95ost, LCX 40p, RCA 40ost/p->Med rx for D2 given restenosis.  . Colitis 06/03/2015  . Colon polyp   . CVA (cerebral vascular accident) ([facility] [place]   Left side weakness.   . Degenerative tear of glenoid labrum of right shoulder 03/15/2017  . Diabetes mellitus without complication ([place]  . Family history of breast cancer    BRCA neg 2014  . Femur fracture, left ([facility]9/10/2018  . Gastric ulcer 04/27/2011  . History of echocardiogram    a. 03/2017 Echo: EF 60-65%, no rwma; b. 02/2018 Echo: EF 60-65%, no rwma. Nl RV fxn. No cardiac source of emboli (admitted w/ stroke).  . Hypertension   . Malignant melanoma of skin of scalp ([facility]  . MI, acute, non ST segment elevation ([facility]  . Neuromuscular disorder ([facility] [place]  . S/P drug eluting coronary stent placement 06/04/2015  . Sepsis ([facility]2/14/2019  . [facility]  a. 02/2018 MRI: 23m late acute/early subacute L medial frontal lobe inarct; b. 02/2018 MRA No large vessel occlusion or aneurysm. Mod to sev L P2 stenosis. thready L vertebral artery, diffusely dzs'd; c. 02/2018 Carotid U/S: <50% bilat ICA dzs.    PAST SURGICAL HISTORY: Past  Surgical History:  Procedure Laterality Date  . APPENDECTOMY    . BALLOON ENTEROSCOPY  02/06/2020   [facility]  . BIOPSY N/A 03/14/2020   Procedure: BIOPSY;  Surgeon: WLucilla Lame MD;  [place]  Service: Endoscopy;  Laterality: N/A;  . CARDIAC CATHETERIZATION N/A 11/09/2014   Procedure: Coronary Angiography;  Surgeon: TMinna Merritts MD;  [place] [place];  Service: Cardiovascular;  Laterality: N/A;  . CARDIAC CATHETERIZATION N/A 11/12/2014   Procedure: Coronary Stent Intervention;  Surgeon: AIsaias Cowman MD;  [facility];  Service: Cardiovascular;  Laterality: N/A;  . CARDIAC CATHETERIZATION N/A 04/18/2015   Procedure: Left Heart Cath and Coronary Angiography;  Surgeon: TMinna Merritts MD;  [facility];  Service: Cardiovascular;  Laterality: N/A;  . CARDIAC CATHETERIZATION Left 06/04/2015   Procedure: Left Heart Cath and Coronary Angiography;  Surgeon: MWellington Hampshire MD;  [facility];  Service: Cardiovascular;  Laterality: Left;  . CARDIAC CATHETERIZATION N/A 06/04/2015   Procedure: Coronary Stent Intervention;  Surgeon: MWellington Hampshire MD;  [facility];  Service: Cardiovascular;  Laterality: N/A;  . CESAREAN SECTION  2001  . CHOLECYSTECTOMY N/A 11/18/2016   Procedure: LAPAROSCOPIC CHOLECYSTECTOMY WITH INTRAOPERATIVE CHOLANGIOGRAM;  Surgeon: SChristene Lye MD;  Location[facility];  Service: General;  Laterality: N/A;  . COLONOSCOPY WITH PROPOFOL N/A 04/27/2016   Procedure: COLONOSCOPY WITH PROPOFOL;  Surgeon: DLucilla Lame MD;  [place]  Service: Endoscopy;  Laterality: N/A;  . COLONOSCOPY WITH PROPOFOL N/A 01/12/2018   Procedure: COLONOSCOPY WITH PROPOFOL;  Surgeon: Toledo, TBenay Pike MD;  Location: [facility];  Service: Endoscopy;  Laterality: N/A;  . COLONOSCOPY WITH PROPOFOL N/A 03/14/2020   Procedure: COLONOSCOPY WITH PROPOFOL;  Surgeon:  WLucilla Lame MD;  Location: [place]  Service: Endoscopy;  Laterality: N/A;  Needs to be scheduled after 7:30 due to driver issues  . CORONARY ANGIOPLASTY    . CORONARY BALLOON ANGIOPLASTY N/A 06/29/2017   Procedure: CORONARY BALLOON ANGIOPLASTY;  Surgeon: AWellington Hampshire MD;  [place];  Service: Cardiovascular;  Laterality: N/A;  . CORONARY BALLOON ANGIOPLASTY N/A 09/20/2017   Procedure: CORONARY BALLOON ANGIOPLASTY;  Surgeon: AWellington Hampshire MD;  [place];  Service: Cardiovascular;  Laterality: N/A;  . CORONARY STENT INTERVENTION N/A 12/13/2019   Procedure: CORONARY STENT INTERVENTION;  Surgeon: AWellington Hampshire MD;  [place];  Service: Cardiovascular;  Laterality: N/A;  . DILATION AND CURETTAGE OF UTERUS    . ESOPHAGOGASTRODUODENOSCOPY (EGD) WITH PROPOFOL N/A 09/14/2014   Procedure: ESOPHAGOGASTRODUODENOSCOPY (EGD) WITH PROPOFOL;  Surgeon: MJosefine Class MD;  [facility]  Service: Endoscopy;  Laterality: N/A;  . ESOPHAGOGASTRODUODENOSCOPY (EGD) WITH PROPOFOL N/A 04/27/2016   Procedure: ESOPHAGOGASTRODUODENOSCOPY (EGD) WITH PROPOFOL;  Surgeon: DLucilla Lame MD;  [place]  Service: Endoscopy;  Laterality: N/A;  Diabetic - oral meds  . ESOPHAGOGASTRODUODENOSCOPY (EGD) WITH PROPOFOL N/A 01/12/2018   Procedure: ESOPHAGOGASTRODUODENOSCOPY (EGD) WITH PROPOFOL;  Surgeon: Toledo, TBenay Pike MD;  Location[facility];  Service: Endoscopy;  Laterality: N/A;  . ESOPHAGOGASTRODUODENOSCOPY (EGD) WITH PROPOFOL N/A 04/11/2019   Procedure: ESOPHAGOGASTRODUODENOSCOPY (  EGD) WITH PROPOFOL;  Surgeon: [name], MD;  [facility];  Service: Endoscopy;  Laterality: N/A;  . ESOPHAGOGASTRODUODENOSCOPY (EGD) WITH PROPOFOL N/A 03/08/2020   Procedure: ESOPHAGOGASTRODUODENOSCOPY (EGD) WITH PROPOFOL;  Surgeon: [name], MD;  Location[facility]COPY;  Service: Endoscopy;  Laterality:  N/A;  . GASTRIC BYPASS  09/2009   [place] (IM) NAIL INTERTROCHANTERIC Left 10/13/2018   Procedure: INTRAMEDULLARY (IM) NAIL INTERTROCHANTRIC;  Surgeon: Leandrew Koyanagi, MD;  Location: [place];  Service: Orthopedics;  Laterality: Left;  . Left Carotid to sublcavian artery bypass w/ subclavian artery ligation     a. Performed @ [place].  . LEFT HEART CATH AND CORONARY ANGIOGRAPHY Left 06/29/2017   Procedure: LEFT HEART CATH AND CORONARY ANGIOGRAPHY;  Surgeon: Wellington Hampshire, MD;  Location: [place] CV LAB;  Service: Cardiovascular;  Laterality: Left;  . LEFT HEART CATH AND CORONARY ANGIOGRAPHY N/A 09/20/2017   Procedure: LEFT HEART CATH AND CORONARY ANGIOGRAPHY;  Surgeon: Wellington Hampshire, MD;  Location: [place] CV LAB;  Service: Cardiovascular;  Laterality: N/A;  . LEFT HEART CATH AND CORONARY ANGIOGRAPHY N/A 12/20/2017   Procedure: LEFT HEART CATH AND CORONARY ANGIOGRAPHY;  Surgeon: Wellington Hampshire, MD;  Location: [place] CV LAB;  Service: Cardiovascular;  Laterality: N/A;  . LEFT HEART CATH AND CORONARY ANGIOGRAPHY N/A 04/20/2019   Procedure: LEFT HEART CATH AND CORONARY ANGIOGRAPHY possible PCI;  Surgeon: Minna Merritts, MD;  Location: [place] CV LAB;  Service: Cardiovascular;  Laterality: N/A;  . LEFT HEART CATH AND CORONARY ANGIOGRAPHY N/A 12/13/2019   Procedure: LEFT HEART CATH AND CORONARY ANGIOGRAPHY;  Surgeon: Wellington Hampshire, MD;  Location: [place] CV LAB;  Service: Cardiovascular;  Laterality: N/A;  . MELANOMA EXCISION  2016   Dr. Evorn Gong  . [place]  2002  . RIGHT OOPHORECTOMY    . SHOULDER ARTHROSCOPY WITH OPEN ROTATOR CUFF REPAIR Right 01/07/2016   Procedure: SHOULDER ARTHROSCOPY WITH DEBRIDMENT, SUBACHROMIAL DECOMPRESSION;  Surgeon: Corky Mull, MD;  Location: [facility];  Service: Orthopedics;  Laterality: Right;  . SHOULDER ARTHROSCOPY WITH OPEN ROTATOR CUFF REPAIR Right 03/16/2017   Procedure: SHOULDER  ARTHROSCOPY WITH OPEN ROTATOR CUFF REPAIR POSSIBLE BICEPS TENODESIS;  Surgeon: Corky Mull, MD;  Location: [facility];  Service: Orthopedics;  Laterality: Right;  . TRIGGER FINGER RELEASE Right     [place]    FAMILY HISTORY Family History  Problem Relation Age of Onset  . Hypertension Mother   . Anxiety disorder Mother   . Depression Mother   . Bipolar disorder Mother   . Heart disease Mother        No details  . Hyperlipidemia Mother   . Kidney disease Father   . Heart disease Father 52  . Hypertension Father   . Diabetes Father   . Stroke Father   . Colon cancer Father        dx in his 19's  . Anxiety disorder Father   . Depression Father   . Skin cancer Father   . Kidney disease Sister   . Thyroid nodules Sister   . Hypertension Sister   . Hypertension Sister   . Diabetes Sister   . Hyperlipidemia Sister   . Depression Sister   . Breast cancer Maternal Aunt 51  . Breast cancer Maternal Aunt 2  . Ovarian cancer Cousin   . Colon cancer Cousin   . Kidney cancer Cousin   . Breast cancer Other   .  Bladder Cancer Neg Hx        ADVANCED DIRECTIVES:    HEALTH MAINTENANCE: Social History   Tobacco Use  . Smoking status: Former Smoker    Packs/day: 0.25    Years: 1.00    Pack years: 0.25    Types: Cigarettes    Quit date: 08/31/1994    Years since quitting: 25.6  . Smokeless tobacco: Never Used  . Tobacco comment: quit 28 years ago  Vaping Use  . Vaping Use: Never used  Substance Use Topics  . Alcohol use: No    Alcohol/week: 0.0 standard drinks  . Drug use: No     Allergies  Allergen Reactions  . Lipitor [Atorvastatin] Other (See Comments)    Leg pains  . Tramadol Other (See Comments)    Mouth feels like it's on fire    Current Outpatient Medications  Medication Sig Dispense Refill  . alosetron (LOTRONEX) 1 MG tablet Take 1 tablet (1 mg total) by mouth 2 (two) times daily. 60 tablet 5  . ALPRAZolam (XANAX) 1 MG tablet Take 1 tablet by  mouth twice daily as needed 60 tablet 5  . amLODipine (NORVASC) 2.5 MG tablet Take 1 tablet (2.5 mg total) by mouth daily. 90 tablet 3  . buPROPion (WELLBUTRIN SR) 200 MG 12 hr tablet Take 200 mg by mouth every morning.    . carvedilol (COREG) 25 MG tablet Take 1 tablet (25 mg total) by mouth 2 (two) times daily with a meal. 180 tablet 3  . desvenlafaxine (PRISTIQ) 50 MG 24 hr tablet Take 50 mg by mouth daily.    . diphenoxylate-atropine (LOMOTIL) 2.5-0.025 MG tablet Take 1 tablet by mouth 4 (four) times daily as needed for diarrhea or loose stools. 120 tablet 3  . doxycycline (VIBRA-TABS) 100 MG tablet Take 1 tablet (100 mg total) by mouth 2 (two) times daily. 20 tablet 0  . EUTHYROX 25 MCG tablet TAKE 1 TABLET BY MOUTH ONCE DAILY BEFORE BREAKFAST 90 tablet 3  . famotidine (PEPCID) 20 MG tablet Take 20 mg by mouth 2 (two) times daily.    Marland Kitchen gabapentin (NEURONTIN) 300 MG capsule Take 2 capsules (600 mg total) by mouth 2 (two) times daily. 360 capsule 4  . Galcanezumab-gnlm (EMGALITY) 120 MG/ML SOAJ Inject 120 mg into the skin every 28 (twenty-eight) days.    Marland Kitchen HYDROcodone-acetaminophen (NORCO) 5-325 MG tablet Take 1 tablet by mouth every 6 (six) hours as needed for moderate pain. 8 tablet 0  . isosorbide mononitrate (IMDUR) 120 MG 24 hr tablet Take 1 tablet (120 mg total) by mouth daily. 30 tablet 6  . Na Sulfate-K Sulfate-Mg Sulf (SUPREP BOWEL PREP KIT) 17.5-3.13-1.6 GM/177ML SOLN Take 1 kit by mouth as directed. 354 mL 0  . nitroGLYCERIN (NITROSTAT) 0.4 MG SL tablet Place 1 tablet (0.4 mg total) under the tongue every 5 (five) minutes as needed for chest pain. 25 tablet 2  . Polyethyl Glycol-Propyl Glycol (SYSTANE OP) Place 1 drop into both eyes daily as needed (dry eyes).    . promethazine (PHENERGAN) 25 MG tablet TAKE 1 TABLET BY MOUTH EVERY 6 HOURS AS NEEDED FOR NAUSEA FOR VOMITING 30 tablet 1  . rosuvastatin (CRESTOR) 40 MG tablet Take 1 tablet (40 mg total) by mouth daily at 6 PM. 90 tablet 3   . traZODone (DESYREL) 100 MG tablet TAKE 1 TO 2 TABLETS BY MOUTH AT BEDTIME 30 tablet 5  . UBRELVY 100 MG TABS Take 100 mg by mouth 2 (two) times daily  as needed (migraine).  (Patient not taking: Reported on 03/14/2020)     No current facility-administered medications for this visit.    OBJECTIVE: There were no vitals filed for this visit.   There is no height or weight on file to calculate BMI.    ECOG FS:0 - Asymptomatic  General: Well-developed, well-nourished, no acute distress. Eyes: Pink conjunctiva, anicteric sclera. HEENT: Normocephalic, moist mucous membranes. Lungs: No audible wheezing or coughing. Heart: Regular rate and rhythm. Abdomen: Soft, nontender, no obvious distention. Musculoskeletal: No edema, cyanosis, or clubbing. Neuro: Alert, answering all questions appropriately. Cranial nerves grossly intact. Skin: No rashes or petechiae noted. Psych: Normal affect.   LAB RESULTS:  Lab Results  Component Value Date   NA 137 03/13/2020   K 5.4 (H) 03/13/2020   CL 104 03/13/2020   CO2 17 (L) 03/13/2020   GLUCOSE 109 (H) 03/13/2020   BUN 30 (H) 03/13/2020   CREATININE 2.21 (H) 03/13/2020   CALCIUM 8.8 03/13/2020   PROT 5.9 (L) 03/13/2020   ALBUMIN 3.3 (L) 03/13/2020   AST 14 03/13/2020   ALT 10 03/13/2020   ALKPHOS 141 (H) 03/13/2020   BILITOT 0.2 03/13/2020   GFRNONAA 25 (L) 03/13/2020   GFRAA 29 (L) 03/13/2020    Lab Results  Component Value Date   WBC 8.8 03/13/2020   NEUTROABS 4.6 12/07/2019   HGB 10.2 (L) 03/13/2020   HCT 32.5 (L) 03/13/2020   MCV 87 03/13/2020   PLT 265 03/13/2020   Lab Results  Component Value Date   IRON 26 (L) 03/07/2020   TIBC 245 (L) 03/07/2020   IRONPCTSAT 11 03/07/2020    Lab Results  Component Value Date   FERRITIN 18 03/07/2020     STUDIES: DG Chest 2 View  Result Date: 03/31/2020 CLINICAL DATA:  Chest pain after fall down hill on Friday. EXAM: CHEST - 2 VIEW COMPARISON:  Chest radiograph December 12, 2019  FINDINGS: The heart size and mediastinal contours are unchanged. Right-sided aortic arch on vascular ring better characterized on prior CT chest. Surgical clips project in the region of the left thoracic inlet. Both lungs are clear. The visualized skeletal structures are unchanged. IMPRESSION: No acute cardiopulmonary disease. Electronically Signed   By: Dahlia Bailiff MD   On: 03/31/2020 09:10   DG Ribs Unilateral Right  Result Date: 03/31/2020 CLINICAL DATA:  Pain after fall down hill on Friday EXAM: RIGHT RIBS - 2 VIEW COMPARISON:  Chest radiograph December 12, 2019 FINDINGS: No displaced fracture or other bone lesions are seen involving the ribs. Cholecystectomy clips. Surgical clips project over the left thoracic inlet. IMPRESSION: No displaced rib fracture visualized. Electronically Signed   By: Dahlia Bailiff MD   On: 03/31/2020 09:15   DG Lumbar Spine 2-3 Views  Result Date: 03/31/2020 CLINICAL DATA:  Pain after fall down hill on Friday EXAM: LUMBAR SPINE - 2-3 VIEW COMPARISON:  Lumbar spine radiographs November 04, 2017 FINDINGS: There is no evidence of lumbar spine fracture. Alignment is normal. Similar mild degenerative change. Cholecystectomy clips. Surgical clip overlies the left lower quadrant. IMPRESSION: No acute osseous abnormality. Mild degenerative changes, similar to prior studies Electronically Signed   By: Dahlia Bailiff MD   On: 03/31/2020 09:14   NM GI Blood Loss  Result Date: 03/08/2020 CLINICAL DATA:  GI bleed, post gastric bypass surgery 2011, patient states she is still bleeding and passing dark stool EXAM: NUCLEAR MEDICINE GASTROINTESTINAL BLEEDING SCAN TECHNIQUE: Sequential abdominal images were obtained following intravenous administration of Tc-81m  labeled red blood cells. RADIOPHARMACEUTICALS:  22.015 mCi Tc-40mpertechnetate in-vitro UltraTag labeled red cells. COMPARISON:  01/25/2020 FINDINGS: Normal blood pool distribution of tracer. No abnormal gastrointestinal  localization of tracer identified to suggest active GI bleeding. Small amount of the labeled tracer within urinary bladder. IMPRESSION: Negative GI bleeding scan. Electronically Signed   By: MLavonia DanaM.D.   On: 03/08/2020 16:51   DG HIP UNILAT WITH PELVIS 2-3 VIEWS LEFT  Result Date: 03/31/2020 CLINICAL DATA:  Pain after fall down hill on Friday EXAM: DG HIP (WITH OR WITHOUT PELVIS) 2-3V LEFT COMPARISON:  Femur radiograph January 10, 2019. FINDINGS: Partially visualized intramedullary rod and nail fixation of the femur. There is no evidence of hip fracture or dislocation. There is no evidence of arthropathy or other focal bone abnormality. Pelvic phleboliths. Vascular calcifications. Surgical clip overlies the left lower quadrant. IMPRESSION: No acute osseous abnormality. Electronically Signed   By: JDahlia BailiffMD   On: 03/31/2020 09:12    ASSESSMENT: Iron deficiency anemia.  PLAN:    1. Iron deficiency anemia: Essentially resolved.  Patient's hemoglobin remains mildly decreased at 11.6, but relatively stable.  Iron stores continue to be within normal limits.  She likely has poor absorption given her history of gastric bypass surgery.  Previously, her entire anemia work-up was either negative or within normal limits.  She last received 510 mg IV Feraheme on February 15, 2019.  She does not require additional treatment today.  Return to clinic in 4 months with repeat laboratory work, further evaluation, and continuation of treatment if necessary.  At that point, patient does not require treatment, can consider discharging from clinic. 2.  Diarrhea/esophageal stricture: Patient does not complain of this today.  Continue evaluation and treatment per GI.     3.  Chronic renal insufficiency: Patient's creatinine is 1.84.  I spent a total of *** minutes reviewing chart data, face-to-face evaluation with the patient, counseling and coordination of care as detailed above.   Patient expressed  understanding and was in agreement with this plan. She also understands that She can call clinic at any time with any questions, concerns, or complaints.   TLloyd Huger MD   04/06/2020 8:57 AM

## 2020-04-08 ENCOUNTER — Inpatient Hospital Stay: Payer: Medicare Other | Attending: Oncology

## 2020-04-08 DIAGNOSIS — N189 Chronic kidney disease, unspecified: Secondary | ICD-10-CM | POA: Insufficient documentation

## 2020-04-08 DIAGNOSIS — Z87891 Personal history of nicotine dependence: Secondary | ICD-10-CM | POA: Diagnosis not present

## 2020-04-08 DIAGNOSIS — L98499 Non-pressure chronic ulcer of skin of other sites with unspecified severity: Secondary | ICD-10-CM | POA: Insufficient documentation

## 2020-04-08 DIAGNOSIS — D509 Iron deficiency anemia, unspecified: Secondary | ICD-10-CM | POA: Insufficient documentation

## 2020-04-08 DIAGNOSIS — Z9884 Bariatric surgery status: Secondary | ICD-10-CM | POA: Diagnosis not present

## 2020-04-08 DIAGNOSIS — I739 Peripheral vascular disease, unspecified: Secondary | ICD-10-CM

## 2020-04-08 DIAGNOSIS — Z955 Presence of coronary angioplasty implant and graft: Secondary | ICD-10-CM

## 2020-04-08 LAB — IRON AND TIBC
Iron: 31 ug/dL (ref 28–170)
Saturation Ratios: 15 % (ref 10.4–31.8)
TIBC: 206 ug/dL — ABNORMAL LOW (ref 250–450)
UIBC: 175 ug/dL

## 2020-04-08 LAB — FERRITIN: Ferritin: 21 ng/mL (ref 11–307)

## 2020-04-08 NOTE — Progress Notes (Signed)
Discharge Progress Report  Patient Details  Name: Tyra Gural MRN: [id number] Date of Birth: 09-Mar-1970 Referring Provider:   Flowsheet Row Cardiac Rehab from 01/04/2020 in [facility]  Referring Provider Kathlyn Sacramento MD       Number of Visits: 6 Reason for Discharge:  Early Exit:  Personal  Smoking History:  Social History   Tobacco Use  Smoking Status Former Smoker  . Packs/day: 0.25  . Years: 1.00  . Pack years: 0.25  . Types: Cigarettes  . Quit date: 08/31/1994  . Years since quitting: 25.6  Smokeless Tobacco Never Used  Tobacco Comment   quit 28 years ago    Diagnosis:  Status post coronary artery stent placement  ADL [facility]:   Initial Exercise Prescription:  Initial Exercise Prescription - 01/04/20 1300      Date of Initial Exercise RX and Referring Provider   Date 01/04/20    Referring Provider Kathlyn Sacramento MD      Treadmill   MPH 1.3    Grade 0    Minutes 15    METs 2      NuStep   Level 1    SPM 80    Minutes 15    METs 2.7      REL-XR   Level 1    Speed 50    Minutes 15    METs 2.7      Biostep-RELP   Level 1    SPM 50    Minutes 15    METs 2.7      Prescription Details   Frequency (times per week) 2    Duration Progress to 30 minutes of continuous aerobic without signs/symptoms of physical distress      Intensity   THRR 40-80% of Max Heartrate 106-148    Ratings of Perceived Exertion 11-13    Perceived Dyspnea 0-4      Progression   Progression Continue to progress workloads to maintain intensity without signs/symptoms of physical distress.      Resistance Training   Training Prescription Yes    Weight 3 lb    Reps 10-15           Discharge Exercise Prescription (Final Exercise Prescription Changes):  Exercise Prescription Changes - 03/04/20 1600      Response to Exercise   Blood Pressure (Admit) 98/64    Blood Pressure (Exercise) 108/68    Blood Pressure (Exit) 100/60    Heart  Rate (Admit) 82 bpm    Heart Rate (Exercise) 88 bpm    Heart Rate (Exit) 63 bpm    Rating of Perceived Exertion (Exercise) 13    Symptoms [place] rhythm   sent to MD   Comments first day back    Duration Progress to 30 minutes of  aerobic without signs/symptoms of physical distress    Intensity THRR unchanged      Progression   Progression Continue to progress workloads to maintain intensity without signs/symptoms of physical distress.    Average METs 2.4      Resistance Training   Training Prescription Yes    Weight ROM    Reps 10-15      Interval Training   Interval Training No      Treadmill   MPH 1.9    Grade 0    Minutes 15    METs 2.45      REL-XR   Level 3    Minutes 15  Functional Capacity:  6 Minute Walk    Row Name 01/04/20 1338         6 Minute Walk   Phase Initial     Distance 805 feet     Walk Time 6 minutes     # of Rest Breaks 0     MPH 1.52     METS 2.79     RPE 13     Perceived Dyspnea  1     VO2 Peak 9.77     Symptoms Yes (comment)     Comments Had anxiety     Resting HR 64 bpm     Resting BP 116/70     Resting Oxygen Saturation  99 %     Exercise Oxygen Saturation  during 6 min walk 96 %     Max Ex. HR 80 bpm     Max Ex. BP 126/68     2 Minute Post BP 110/68            Psychological, QOL, Others - Outcomes: PHQ 2/9: Depression screen [facility] 2/9 01/04/2020 08/22/2019 08/22/2019  Decreased Interest 1 3 3   Down, Depressed, Hopeless 3 3 3   PHQ - 2 Score 4 6 6   Altered sleeping 3 3 3   Tired, decreased energy 3 3 3   Change in appetite 3 3 3   Feeling bad or failure about yourself  3 3 2   Trouble concentrating 3 3 3   Moving slowly or fidgety/restless 3 2 0  Suicidal thoughts 2 0 0  PHQ-9 Score 24 23 20   Difficult doing work/chores Very difficult Very difficult Very difficult  Some recent data might be hidden    Quality of Life:  Quality of Life - 01/04/20 1351      Quality of Life   Select Quality of Life       Quality of Life Scores   Health/Function Pre 5.6 %    Socioeconomic Pre 18.86 %    Psych/Spiritual Pre 5.14 %    Family Pre 15.6 %    GLOBAL Pre 9.71 %           Personal Goals: Goals established at orientation with interventions provided to work toward goal.  Personal Goals and Risk Factors at Admission - 01/04/20 1348      Core Components/Risk Factors/Patient Goals on Admission    Weight Management Yes;Weight Loss    Intervention Weight Management: Develop a combined nutrition and exercise program designed to reach desired caloric intake, while maintaining appropriate intake of nutrient and fiber, sodium and fats, and appropriate energy expenditure required for the weight goal.;Weight Management: Provide education and appropriate resources to help participant work on and attain dietary goals.;Weight Management/Obesity: Establish reasonable short term and long term weight goals.    Admit Weight 153 lb 1.6 oz (69.4 kg)    Goal Weight: Short Term 150 lb (68 kg)    Goal Weight: Long Term 147 lb (66.7 kg)    Expected Outcomes Short Term: Continue to assess and modify interventions until short term weight is achieved;Long Term: Adherence to nutrition and physical activity/exercise program aimed toward attainment of established weight goal;Understanding recommendations for meals to include 15-35% energy as protein, 25-35% energy from fat, 35-60% energy from carbohydrates, less than 200mg  of dietary cholesterol, 20-35 gm of total fiber daily;Understanding of distribution of calorie intake throughout the day with the consumption of 4-5 meals/snacks;Weight Loss: Understanding of general recommendations for a balanced deficit meal plan, which promotes 1-2 lb weight loss per week  and includes a negative energy balance of [phone] kcal/d    Diabetes Yes    Intervention Provide education about signs/symptoms and action to take for hypo/hyperglycemia.;Provide education about proper nutrition, including  hydration, and aerobic/resistive exercise prescription along with prescribed medications to achieve blood glucose in normal ranges: Fasting glucose 65-99 mg/dL    Expected Outcomes Short Term: Participant verbalizes understanding of the signs/symptoms and immediate care of hyper/hypoglycemia, proper foot care and importance of medication, aerobic/resistive exercise and nutrition plan for blood glucose control.;Long Term: Attainment of HbA1C < 7%.    Heart Failure Yes    Intervention Provide a combined exercise and nutrition program that is supplemented with education, support and counseling about heart failure. Directed toward relieving symptoms such as shortness of breath, decreased exercise tolerance, and extremity edema.    Expected Outcomes Improve functional capacity of life;Short term: Attendance in program 2-3 days a week with increased exercise capacity. Reported lower sodium intake. Reported increased fruit and vegetable intake. Reports medication compliance.;Short term: Daily weights obtained and reported for increase. Utilizing diuretic protocols set by physician.;Long term: Adoption of self-care skills and reduction of barriers for early signs and symptoms recognition and intervention leading to self-care maintenance.    Hypertension Yes    Intervention Provide education on lifestyle modifcations including regular physical activity/exercise, weight management, moderate sodium restriction and increased consumption of fresh fruit, vegetables, and low fat dairy, alcohol moderation, and smoking cessation.;Monitor prescription use compliance.    Expected Outcomes Short Term: Continued assessment and intervention until BP is < 140/76mm HG in hypertensive participants. < 130/46mm HG in hypertensive participants with diabetes, heart failure or chronic kidney disease.;Long Term: Maintenance of blood pressure at goal levels.    Lipids Yes    Intervention Provide education and support for participant on  nutrition & aerobic/resistive exercise along with prescribed medications to achieve LDL 70mg , HDL >40mg .    Expected Outcomes Short Term: Participant states understanding of desired cholesterol values and is compliant with medications prescribed. Participant is following exercise prescription and nutrition guidelines.;Long Term: Cholesterol controlled with medications as prescribed, with individualized exercise RX and with personalized nutrition plan. Value goals: LDL < 70mg , HDL > 40 mg.    Stress Yes    Intervention Offer individual and/or small group education and counseling on adjustment to heart disease, stress management and health-related lifestyle change. Teach and support self-help strategies.;Refer participants experiencing significant psychosocial distress to appropriate mental health specialists for further evaluation and treatment. When possible, include family members and significant others in education/counseling sessions.    Expected Outcomes Short Term: Participant demonstrates changes in health-related behavior, relaxation and other stress management skills, ability to obtain effective social support, and compliance with psychotropic medications if prescribed.;Long Term: Emotional wellbeing is indicated by absence of clinically significant psychosocial distress or social isolation.            Personal Goals Discharge:   Exercise Goals and Review:  Exercise Goals    Row Name 01/04/20 1345             Exercise Goals   Increase Physical Activity Yes       Intervention Provide advice, education, support and counseling about physical activity/exercise needs.;Develop an individualized exercise prescription for aerobic and resistive training based on initial evaluation findings, risk stratification, comorbidities and participant's personal goals.       Expected Outcomes Short Term: Attend rehab on a regular basis to increase amount of physical activity.;Long Term: Add in home  exercise to make exercise  part of routine and to increase amount of physical activity.;Long Term: Exercising regularly at least 3-5 days a week.       Increase Strength and Stamina Yes       Intervention Provide advice, education, support and counseling about physical activity/exercise needs.;Develop an individualized exercise prescription for aerobic and resistive training based on initial evaluation findings, risk stratification, comorbidities and participant's personal goals.       Expected Outcomes Short Term: Increase workloads from initial exercise prescription for resistance, speed, and METs.;Short Term: Perform resistance training exercises routinely during rehab and add in resistance training at home;Long Term: Improve cardiorespiratory fitness, muscular endurance and strength as measured by increased METs and functional capacity (6MWT)       Able to understand and use rate of perceived exertion (RPE) scale Yes       Intervention Provide education and explanation on how to use RPE scale       Expected Outcomes Short Term: Able to use RPE daily in rehab to express subjective intensity level;Long Term:  Able to use RPE to guide intensity level when exercising independently       Able to understand and use Dyspnea scale Yes       Intervention Provide education and explanation on how to use Dyspnea scale       Expected Outcomes Short Term: Able to use Dyspnea scale daily in rehab to express subjective sense of shortness of breath during exertion;Long Term: Able to use Dyspnea scale to guide intensity level when exercising independently       Knowledge and understanding of Target Heart Rate Range (THRR) Yes       Intervention Provide education and explanation of THRR including how the numbers were predicted and where they are located for reference       Expected Outcomes Short Term: Able to state/look up THRR;Short Term: Able to use daily as guideline for intensity in rehab;Long Term: Able to use  THRR to govern intensity when exercising independently       Able to check pulse independently Yes       Intervention Provide education and demonstration on how to check pulse in carotid and radial arteries.;Review the importance of being able to check your own pulse for safety during independent exercise       Expected Outcomes Short Term: Able to explain why pulse checking is important during independent exercise;Long Term: Able to check pulse independently and accurately       Understanding of Exercise Prescription Yes       Intervention Provide education, explanation, and written materials on patient's individual exercise prescription       Expected Outcomes Short Term: Able to explain program exercise prescription;Long Term: Able to explain home exercise prescription to exercise independently       Improve claudication pain toleration; Improve walking ability Yes              Exercise Goals Re-Evaluation:  Exercise Goals Re-Evaluation    Row Name 01/11/20 0751 01/22/20 1637 02/19/20 1047 03/04/20 1619 03/19/20 0844     Exercise Goal Re-Evaluation   Exercise Goals Review Increase Physical Activity;Able to understand and use rate of perceived exertion (RPE) scale;Knowledge and understanding of Target Heart Rate Range (THRR);Understanding of Exercise Prescription;Increase Strength and Stamina;Able to understand and use Dyspnea scale;Able to check pulse independently Increase Physical Activity;Increase Strength and Stamina;Understanding of Exercise Prescription -- Increase Physical Activity;Increase Strength and Stamina;Understanding of Exercise Prescription --   Comments Reviewed RPE and dyspnea  scales, THR and program prescription with pt today.  Pt voiced understanding and was given a copy of goals to take home. Tyrhonda has completed her first full day of exercise.  We will continue to montior her progress. Out since last review Marnita has returned to rehab.  Her first day back she appeared to be  in [place] and was sent to office for evaluation and cleared to return.  She did well her next day back.  We will continue to montior her progress. Out since last review   Expected Outcomes Short: Use RPE daily to regulate intensity. Long: Follow program prescription in THR. Short: Attend rehab regularly Long: Continue to follow program prescription -- Short: Continue to attend regularly Long: Continue to improve stamina. --          Nutrition & Weight - Outcomes:  Pre Biometrics - 01/04/20 1347      Pre Biometrics   Height 5\' 3"  (1.6 m)    Weight 153 lb 1.6 oz (69.4 kg)    BMI (Calculated) 27.13    Single Leg Stand 8.2 seconds            Nutrition:  Nutrition Therapy & Goals - 01/08/20 1609      Personal Nutrition Goals   Comments Picture Your Place: 27:  A PYP score greater than 60 likely indicates a healthful dietary pattern, although there may be some specific eating behaviors that could be improved. Oree has 2-3 servings of vegetables per day (green beans written in), 0-1 servings of fruit per day; 2+ servings of dark green vegeabltes, 0-3 servings of red and orange vegetables, 0-10 servings of starchy vegetables per week. She has 1 serving of whole grain breads per day and 1-2 servings of other whole grains per week. She rarely has red meat. She will eat chicken and has fish 1x/week. She has 1-2 servings of beans and 0-1 servings or nuts/nut butter per week. She has either 0 or 4+ servings of low fat dairy and eats natural cheese. She uses soft margarine, does not fry food, uses spray olive oil with baking, and herbs/spices on vegetables. She rarely gets low sodium or no salt added canned items, she will use seasoning packets or sauce 1x/week and canned soups, bouillon, and bottled dressings 1x/week. She does not salt her foods. She will have snacks like chips, crackers, pretzels, popcorn 2-3x/week. She has regular soda 2+x/day and does not drink alcohol.           Nutrition  Discharge:   Education Questionnaire Score:  Knowledge Questionnaire Score - 01/04/20 1352      Knowledge Questionnaire Score   Pre Score 19/26: A&P, Angina, PAD, Exercise, Tobacco           Goals reviewed with patient; copy given to patient.

## 2020-04-08 NOTE — Progress Notes (Signed)
Cardiac Individual Treatment Plan  Patient Details  Name: [name] MRN: [id number] Date of Birth: Jun 08, 1970 Referring Provider:   Flowsheet Row Cardiac Rehab from 01/04/2020 in [facility] Cardiac and Pulmonary Rehab  Referring Provider Kathlyn Sacramento MD      Initial Encounter Date:  Flowsheet Row Cardiac Rehab from 01/04/2020 in [facility] Cardiac and Pulmonary Rehab  Date 01/04/20      Visit Diagnosis: Status post coronary artery stent placement  Patient's Home Medications on Admission:  Current Outpatient Medications:  .  alosetron (LOTRONEX) 1 MG tablet, Take 1 tablet (1 mg total) by mouth 2 (two) times daily., Disp: 60 tablet, Rfl: 5 .  ALPRAZolam (XANAX) 1 MG tablet, Take 1 tablet by mouth twice daily as needed, Disp: 60 tablet, Rfl: 5 .  amLODipine (NORVASC) 2.5 MG tablet, Take 1 tablet (2.5 mg total) by mouth daily., Disp: 90 tablet, Rfl: 3 .  buPROPion (WELLBUTRIN SR) 200 MG 12 hr tablet, Take 200 mg by mouth every morning., Disp: , Rfl:  .  carvedilol (COREG) 25 MG tablet, Take 1 tablet (25 mg total) by mouth 2 (two) times daily with a meal., Disp: 180 tablet, Rfl: 3 .  desvenlafaxine (PRISTIQ) 50 MG 24 hr tablet, Take 50 mg by mouth daily., Disp: , Rfl:  .  diphenoxylate-atropine (LOMOTIL) 2.5-0.025 MG tablet, Take 1 tablet by mouth 4 (four) times daily as needed for diarrhea or loose stools., Disp: 120 tablet, Rfl: 3 .  doxycycline (VIBRA-TABS) 100 MG tablet, Take 1 tablet (100 mg total) by mouth 2 (two) times daily., Disp: 20 tablet, Rfl: 0 .  EUTHYROX 25 MCG tablet, TAKE 1 TABLET BY MOUTH ONCE DAILY BEFORE BREAKFAST, Disp: 90 tablet, Rfl: 3 .  famotidine (PEPCID) 20 MG tablet, Take 20 mg by mouth 2 (two) times daily., Disp: , Rfl:  .  gabapentin (NEURONTIN) 300 MG capsule, Take 2 capsules (600 mg total) by mouth 2 (two) times daily., Disp: 360 capsule, Rfl: 4 .  Galcanezumab-gnlm (EMGALITY) 120 MG/ML SOAJ, Inject 120 mg into the skin every 28 (twenty-eight) days., Disp:  , Rfl:  .  HYDROcodone-acetaminophen (NORCO) 5-325 MG tablet, Take 1 tablet by mouth every 6 (six) hours as needed for moderate pain., Disp: 8 tablet, Rfl: 0 .  isosorbide mononitrate (IMDUR) 120 MG 24 hr tablet, Take 1 tablet (120 mg total) by mouth daily., Disp: 30 tablet, Rfl: 6 .  Na Sulfate-K Sulfate-Mg Sulf (SUPREP BOWEL PREP KIT) 17.5-3.13-1.6 GM/177ML SOLN, Take 1 kit by mouth as directed., Disp: 354 mL, Rfl: 0 .  nitroGLYCERIN (NITROSTAT) 0.4 MG SL tablet, Place 1 tablet (0.4 mg total) under the tongue every 5 (five) minutes as needed for chest pain., Disp: 25 tablet, Rfl: 2 .  Polyethyl Glycol-Propyl Glycol (SYSTANE OP), Place 1 drop into both eyes daily as needed (dry eyes)., Disp: , Rfl:  .  promethazine (PHENERGAN) 25 MG tablet, TAKE 1 TABLET BY MOUTH EVERY 6 HOURS AS NEEDED FOR NAUSEA FOR VOMITING, Disp: 30 tablet, Rfl: 1 .  rosuvastatin (CRESTOR) 40 MG tablet, Take 1 tablet (40 mg total) by mouth daily at 6 PM., Disp: 90 tablet, Rfl: 3 .  traZODone (DESYREL) 100 MG tablet, TAKE 1 TO 2 TABLETS BY MOUTH AT BEDTIME, Disp: 30 tablet, Rfl: 5 .  UBRELVY 100 MG TABS, Take 100 mg by mouth 2 (two) times daily as needed (migraine).  (Patient not taking: Reported on 03/14/2020), Disp: , Rfl:   Past Medical History: Past Medical History:  Diagnosis Date  .  Acute colitis 01/27/2017  . Acute pyelonephritis   . Anemia    iron deficiency anemia  . Aortic arch aneurysm ([place])   . BRCA negative 2014  . CAD (coronary artery disease)    a. 08/2003 Cath: LAD 30-40-med Rx; b. 11/2014 PCI: LAD 64m(3.25x23 Xience Alpine DES); c. 06/2015 PCI: D1 (2.25x12 Resolute Integrity DES); d. 06/2017 PCI: Patent mLAD stent, D2 95 (PTCA); e. 09/2017 PCI: D2 99ost (CBA); d. 12/2017 Cath: LM nl, LAD 364m80d (small), D1 40ost, D2 95ost, LCX 40p, RCA 40ost/p->Med rx for D2 given restenosis.  . Colitis 06/03/2015  . Colon polyp   . CVA (cerebral vascular accident) ([place]   Left side weakness.   . Degenerative tear of  glenoid labrum of right shoulder 03/15/2017  . Diabetes mellitus without complication ([place]  . Family history of breast cancer    BRCA neg 2014  . Femur fracture, left ([facility]CGarner9/10/2018  . Gastric ulcer 04/27/2011  . History of echocardiogram    a. 03/2017 Echo: EF 60-65%, no rwma; b. 02/2018 Echo: EF 60-65%, no rwma. Nl RV fxn. No cardiac source of emboli (admitted w/ stroke).  . Hypertension   . Malignant melanoma of skin of scalp (HCOkar[place]  . MI, acute, non ST segment elevation ([place]  . Neuromuscular disorder ([place]  . S/P drug eluting coronary stent placement 06/04/2015  . Sepsis ([facility]CS[place]2/14/2019  . [facility] ([facility]   a. 02/2018 MRI: 23m61mate acute/early subacute L medial frontal lobe inarct; b. 02/2018 MRA No large vessel occlusion or aneurysm. Mod to sev L P2 stenosis. thready L vertebral artery, diffusely dzs'd; c. 02/2018 Carotid U/S: <50% bilat ICA dzs.    Tobacco Use: Social History   Tobacco Use  Smoking Status Former Smoker  . Packs/day: 0.25  . Years: 1.00  . Pack years: 0.25  . Types: Cigarettes  . Quit date: 08/31/1994  . Years since quitting: 25.6  Smokeless Tobacco Never Used  Tobacco Comment   quit 28 years ago    Labs: Recent Review Flowsheet Data    Labs for ITP Cardiac and Pulmonary Rehab Latest Ref Rng & Units 01/28/2019 04/19/2019 09/03/2019 11/06/2019 01/25/2020   Cholestrol 0 - 200 mg/dL 106 125 - 94 -   LDLCALC 0 - 99 mg/dL 40 76 - 48 -   LDLDIRECT mg/dL - - - - -   HDL >40 mg/dL 56 34(L) - 29(L) -   Trlycerides <150 mg/dL 52 75 - 84 -   Hemoglobin A1c 4.8 - 5.6 % 5.6 5.1 4.8 5.2 5.0   PHART 7.350 - 7.450 - - - - -   PCO2ART 32.0 - 48.0 mmHg - - - - -   HCO3 20.0 - 28.0 mmol/L - - - - -   ACIDBASEDEF 0.0 - 2.0 mmol/L - - - - -   O2SAT % - - - - -       Exercise Target Goals: Exercise Program Goal: Individual exercise prescription set using results from initial 6 min walk test and THRR while considering  patient's activity barriers and safety.   Exercise  Prescription Goal: Initial exercise prescription builds to 30-45 minutes a day of aerobic activity, 2-3 days per week.  Home exercise guidelines will be given to patient during program as part of exercise prescription that the participant will acknowledge.   Education: Aerobic Exercise: - Group verbal and visual presentation on the components of exercise prescription. Introduces F.I.T.T principle from ACSM for exercise  prescriptions.  Reviews F.I.T.T. principles of aerobic exercise including progression. Written material given at graduation.   Education: Resistance Exercise: - Group verbal and visual presentation on the components of exercise prescription. Introduces F.I.T.T principle from ACSM for exercise prescriptions  Reviews F.I.T.T. principles of resistance exercise including progression. Written material given at graduation.    Education: Exercise & Equipment Safety: - Individual verbal instruction and demonstration of equipment use and safety with use of the equipment. Flowsheet Row Cardiac Rehab from 01/25/2020 in [facility] Cardiac and Pulmonary Rehab  Date 12/26/19  Educator [facility]  Instruction Review Code 1- Verbalizes Understanding      Education: Exercise Physiology & General Exercise Guidelines: - Group verbal and written instruction with models to review the exercise physiology of the cardiovascular system and associated critical values. Provides general exercise guidelines with specific guidelines to those with heart or lung disease.    Education: Flexibility, Balance, Mind/Body Relaxation: - Group verbal and visual presentation with interactive activity on the components of exercise prescription. Introduces F.I.T.T principle from ACSM for exercise prescriptions. Reviews F.I.T.T. principles of flexibility and balance exercise training including progression. Also discusses the mind body connection.  Reviews various relaxation techniques to help reduce and manage stress (i.e. Deep  breathing, progressive muscle relaxation, and visualization). Balance handout provided to take home. Written material given at graduation.   Activity Barriers & Risk Stratification:  Activity Barriers & Cardiac Risk Stratification - 01/04/20 1347      Activity Barriers & Cardiac Risk Stratification   Activity Barriers Shortness of Breath;Deconditioning;Muscular Weakness;Other (comment)    Comments Neuropathy, rod in left leg    Cardiac Risk Stratification High           6 Minute Walk:  6 Minute Walk    Row Name 01/04/20 1338         6 Minute Walk   Phase Initial     Distance 805 feet     Walk Time 6 minutes     # of Rest Breaks 0     MPH 1.52     METS 2.79     RPE 13     Perceived Dyspnea  1     VO2 Peak 9.77     Symptoms Yes (comment)     Comments Had anxiety     Resting HR 64 bpm     Resting BP 116/70     Resting Oxygen Saturation  99 %     Exercise Oxygen Saturation  during 6 min walk 96 %     Max Ex. HR 80 bpm     Max Ex. BP 126/68     2 Minute Post BP 110/68            Oxygen Initial Assessment:   Oxygen Re-Evaluation:   Oxygen Discharge (Final Oxygen Re-Evaluation):   Initial Exercise Prescription:  Initial Exercise Prescription - 01/04/20 1300      Date of Initial Exercise RX and Referring Provider   Date 01/04/20    Referring Provider Kathlyn Sacramento MD      Treadmill   MPH 1.3    Grade 0    Minutes 15    METs 2      NuStep   Level 1    SPM 80    Minutes 15    METs 2.7      REL-XR   Level 1    Speed 50    Minutes 15    METs 2.7      Biostep-RELP  Level 1    SPM 50    Minutes 15    METs 2.7      Prescription Details   Frequency (times per week) 2    Duration Progress to 30 minutes of continuous aerobic without signs/symptoms of physical distress      Intensity   THRR 40-80% of Max Heartrate 106-148    Ratings of Perceived Exertion 11-13    Perceived Dyspnea 0-4      Progression   Progression Continue to progress  workloads to maintain intensity without signs/symptoms of physical distress.      Resistance Training   Training Prescription Yes    Weight 3 lb    Reps 10-15           Perform Capillary Blood Glucose checks as needed.  Exercise Prescription Changes:  Exercise Prescription Changes    Row Name 01/04/20 1300 01/22/20 1600 03/04/20 1600         Response to Exercise   Blood Pressure (Admit) 116/70 118/78 98/64     Blood Pressure (Exercise) 126/68 120/60 108/68     Blood Pressure (Exit) 110/68 102/62 100/60     Heart Rate (Admit) 64 bpm 75 bpm 82 bpm     Heart Rate (Exercise) 80 bpm 83 bpm 88 bpm     Heart Rate (Exit) 65 bpm 64 bpm 63 bpm     Oxygen Saturation (Admit) 99 % -- --     Oxygen Saturation (Exercise) 95 % -- --     Oxygen Saturation (Exit) 99 % -- --     Rating of Perceived Exertion (Exercise) _0 Perceived Dyspnea (Exercise) 1 -- --     Symptoms anxiety nervous Torsades rhythm  sent to MD     Comments walk test results first full day of exercise first day back     Duration -- Progress to 30 minutes of  aerobic without signs/symptoms of physical distress Progress to 30 minutes of  aerobic without signs/symptoms of physical distress     Intensity -- THRR unchanged THRR unchanged           Progression   Progression -- Continue to progress workloads to maintain intensity without signs/symptoms of physical distress. Continue to progress workloads to maintain intensity without signs/symptoms of physical distress.     Average METs -- 1.1 2.4           Resistance Training   Training Prescription -- Yes Yes     Weight -- 3 lb ROM     Reps -- 10-15 10-15           Interval Training   Interval Training -- No No           Treadmill   MPH -- -- 1.9     Grade -- -- 0     Minutes -- -- 15     METs -- -- 2.45           NuStep   Level -- 1 --     Minutes -- 30 --     METs -- 1.1 --           REL-XR   Level -- -- 3     Minutes -- -- 15             Exercise Comments:  Exercise Comments    Row Name 01/11/20 0750 02/27/20 0842         Exercise Comments First full day of  exercise!  Patient was oriented to gym and equipment including functions, settings, policies, and procedures.  Patient's individual exercise prescription and treatment plan were reviewed.  All starting workloads were established based on the results of the 6 minute walk test done at initial orientation visit.  The plan for exercise progression was also introduced and progression will be customized based on patient's performance and goals. [name] did not complete her rehab session.  [name] came in with elevated HR  135  Then had a 30-60 second run of v tach.   No symptoms except feeling her heart racing.  She was taken via wheelchair to her cardiology office to be seen by provider in the office.             Exercise Goals and Review:  Exercise Goals    Row Name 01/04/20 1345             Exercise Goals   Increase Physical Activity Yes       Intervention Provide advice, education, support and counseling about physical activity/exercise needs.;Develop an individualized exercise prescription for aerobic and resistive training based on initial evaluation findings, risk stratification, comorbidities and participant's personal goals.       Expected Outcomes Short Term: Attend rehab on a regular basis to increase amount of physical activity.;Long Term: Add in home exercise to make exercise part of routine and to increase amount of physical activity.;Long Term: Exercising regularly at least 3-5 days a week.       Increase Strength and Stamina Yes       Intervention Provide advice, education, support and counseling about physical activity/exercise needs.;Develop an individualized exercise prescription for aerobic and resistive training based on initial evaluation findings, risk stratification, comorbidities and participant's personal goals.       Expected  Outcomes Short Term: Increase workloads from initial exercise prescription for resistance, speed, and METs.;Short Term: Perform resistance training exercises routinely during rehab and add in resistance training at home;Long Term: Improve cardiorespiratory fitness, muscular endurance and strength as measured by increased METs and functional capacity (6MWT)       Able to understand and use rate of perceived exertion (RPE) scale Yes       Intervention Provide education and explanation on how to use RPE scale       Expected Outcomes Short Term: Able to use RPE daily in rehab to express subjective intensity level;Long Term:  Able to use RPE to guide intensity level when exercising independently       Able to understand and use Dyspnea scale Yes       Intervention Provide education and explanation on how to use Dyspnea scale       Expected Outcomes Short Term: Able to use Dyspnea scale daily in rehab to express subjective sense of shortness of breath during exertion;Long Term: Able to use Dyspnea scale to guide intensity level when exercising independently       Knowledge and understanding of Target Heart Rate Range (THRR) Yes       Intervention Provide education and explanation of THRR including how the numbers were predicted and where they are located for reference       Expected Outcomes Short Term: Able to state/look up THRR;Short Term: Able to use daily as guideline for intensity in rehab;Long Term: Able to use THRR to govern intensity when exercising independently       Able to check pulse independently Yes       Intervention Provide education  and demonstration on how to check pulse in carotid and radial arteries.;Review the importance of being able to check your own pulse for safety during independent exercise       Expected Outcomes Short Term: Able to explain why pulse checking is important during independent exercise;Long Term: Able to check pulse independently and accurately       Understanding of  Exercise Prescription Yes       Intervention Provide education, explanation, and written materials on patient's individual exercise prescription       Expected Outcomes Short Term: Able to explain program exercise prescription;Long Term: Able to explain home exercise prescription to exercise independently       Improve claudication pain toleration; Improve walking ability Yes              Exercise Goals Re-Evaluation :  Exercise Goals Re-Evaluation    Row Name 01/11/20 0751 01/22/20 1637 02/19/20 1047 03/04/20 1619 03/19/20 0844     Exercise Goal Re-Evaluation   Exercise Goals Review Increase Physical Activity;Able to understand and use rate of perceived exertion (RPE) scale;Knowledge and understanding of Target Heart Rate Range (THRR);Understanding of Exercise Prescription;Increase Strength and Stamina;Able to understand and use Dyspnea scale;Able to check pulse independently Increase Physical Activity;Increase Strength and Stamina;Understanding of Exercise Prescription -- Increase Physical Activity;Increase Strength and Stamina;Understanding of Exercise Prescription --   Comments Reviewed RPE and dyspnea scales, THR and program prescription with pt today.  Pt voiced understanding and was given a copy of goals to take home. [name] has completed her first full day of exercise.  We will continue to montior her progress. Out since last review [name] has returned to rehab.  Her first day back she appeared to be in Torsades and was sent to office for evaluation and cleared to return.  She did well her next day back.  We will continue to montior her progress. Out since last review   Expected Outcomes Short: Use RPE daily to regulate intensity. Long: Follow program prescription in THR. Short: Attend rehab regularly Long: Continue to follow program prescription -- Short: Continue to attend regularly Long: Continue to improve stamina. --          Discharge Exercise Prescription (Final Exercise Prescription  Changes):  Exercise Prescription Changes - 03/04/20 1600      Response to Exercise   Blood Pressure (Admit) 98/64    Blood Pressure (Exercise) 108/68    Blood Pressure (Exit) 100/60    Heart Rate (Admit) 82 bpm    Heart Rate (Exercise) 88 bpm    Heart Rate (Exit) 63 bpm    Rating of Perceived Exertion (Exercise) 13    Symptoms Torsades rhythm   sent to MD   Comments first day back    Duration Progress to 30 minutes of  aerobic without signs/symptoms of physical distress    Intensity THRR unchanged      Progression   Progression Continue to progress workloads to maintain intensity without signs/symptoms of physical distress.    Average METs 2.4      Resistance Training   Training Prescription Yes    Weight ROM    Reps 10-15      Interval Training   Interval Training No      Treadmill   MPH 1.9    Grade 0    Minutes 15    METs 2.45      REL-XR   Level 3    Minutes 15  Nutrition:  Target Goals: Understanding of nutrition guidelines, daily intake of sodium <1543m, cholesterol <2061m calories 30% from fat and 7% or less from saturated fats, daily to have 5 or more servings of fruits and vegetables.  Education: All About Nutrition: -Group instruction provided by verbal, written material, interactive activities, discussions, models, and posters to present general guidelines for heart healthy nutrition including fat, fiber, MyPlate, the role of sodium in heart healthy nutrition, utilization of the nutrition label, and utilization of this knowledge for meal planning. Follow up email sent as well. Written material given at graduation.   Biometrics:  Pre Biometrics - 01/04/20 1347      Pre Biometrics   Height _0  (1.6 m)    Weight 153 lb 1.6 oz (69.4 kg)    BMI (Calculated) 27.13    Single Leg Stand 8.2 seconds            Nutrition Therapy Plan and Nutrition Goals:  Nutrition Therapy & Goals - 01/08/20 1609      Personal Nutrition Goals   Comments  Picture Your Place: 6725 A PYP score greater than 60 likely indicates a healthful dietary pattern, although there may be some specific eating behaviors that could be improved. [name] has 2-3 servings of vegetables per day (green beans written in), 0-1 servings of fruit per day; 2+ servings of dark green vegeabltes, 0-3 servings of red and orange vegetables, 0-10 servings of starchy vegetables per week. She has 1 serving of whole grain breads per day and 1-2 servings of other whole grains per week. She rarely has red meat. She will eat chicken and has fish 1x/week. She has 1-2 servings of beans and 0-1 servings or nuts/nut butter per week. She has either 0 or 4+ servings of low fat dairy and eats natural cheese. She uses soft margarine, does not fry food, uses spray olive oil with baking, and herbs/spices on vegetables. She rarely gets low sodium or no salt added canned items, she will use seasoning packets or sauce 1x/week and canned soups, bouillon, and bottled dressings 1x/week. She does not salt her foods. She will have snacks like chips, crackers, pretzels, popcorn 2-3x/week. She has regular soda 2+x/day and does not drink alcohol.           Nutrition Assessments:  MEDIFICTS Score Key:  ?70 Need to make dietary changes   40-70 Heart Healthy Diet  ? 40 Therapeutic Level Cholesterol Diet  [facility]  Picture Your Plate Total Score on Admission 67     Picture Your Plate Scores:  <4<33nhealthy dietary pattern with much room for improvement.  41-50 Dietary pattern unlikely to meet recommendations for good health and room for improvement.  51-60 More healthful dietary pattern, with some room for improvement.   >60 Healthy dietary pattern, although there may be some specific behaviors that could be improved.    Nutrition Goals Re-Evaluation:   Nutrition Goals Discharge (Final Nutrition Goals  Re-Evaluation):   Psychosocial: Target Goals: Acknowledge presence or absence of significant depression and/or stress, maximize coping skills, provide positive support system. Participant is able to verbalize types and ability to use techniques and skills needed for reducing stress and depression.   Education: Stress, Anxiety, and Depression - Group verbal and visual presentation to define topics covered.  Reviews how body is impacted by stress, anxiety, and depression.  Also discusses healthy ways to reduce stress and to treat/manage anxiety and depression.  Written material given at graduation. Flowsheet Row Cardiac Rehab from 01/25/2020 in [facility] Cardiac and Pulmonary Rehab  Date 01/11/20  Educator SB  Instruction Review Code 1- [employer] Understanding      Education: Sleep Hygiene -Provides group verbal and written instruction about how sleep can affect your health.  Define sleep hygiene, discuss sleep cycles and impact of sleep habits. Review good sleep hygiene tips.    Initial Review & Psychosocial Screening:  Initial Psych Review & Screening - 12/26/19 0843      Initial Review   Current issues with Current Psychotropic Meds;Current Anxiety/Panic;History of Depression;Current Depression;Current Stress Concerns;Current Sleep Concerns    Source of Stress Concerns Chronic Illness;Unable to perform yard/household activities    Comments Her son is a Airline 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 She gets of out breath alot and has chest pains sometimes and needs to lay down. She has chest pain gets depressed and is scared to go out by herself.      Family Dynamics   Good Support System? Yes    Comments She can look to her son and her church family. [name] states that she has no one else to look to for support.      Barriers   Psychosocial barriers to participate in program The patient should benefit from training in stress management and relaxation.      Screening Interventions    Interventions To provide support and resources with identified psychosocial needs;Encouraged to exercise;Provide feedback about the scores to participant    Expected Outcomes Short Term goal: Utilizing psychosocial counselor, staff and physician to assist with identification of specific Stressors or current issues interfering with healing process. Setting desired goal for each stressor or current issue identified.;Long Term Goal: Stressors or current issues are controlled or eliminated.;Short Term goal: Identification and review with participant of any Quality of Life or Depression concerns found by scoring the questionnaire.;Long Term goal: The participant improves quality of Life and PHQ9 Scores as seen by post scores and/or verbalization of changes           Quality of Life Scores:   Quality of Life - 01/04/20 1351      Quality of Life   Select Quality of Life      Quality of Life Scores   Health/Function Pre 5.6 %    Socioeconomic Pre 18.86 %    Psych/Spiritual Pre 5.14 %    Family Pre 15.6 %    GLOBAL Pre 9.71 %          Scores of 19 and below usually indicate a poorer quality of life in these areas.  A difference of  2-3 points is a clinically meaningful difference.  A difference of 2-3 points in the total score of the Quality of Life Index has been associated with significant improvement in overall quality of life, self-image, physical symptoms, and general health in studies assessing change in quality of life.  PHQ-9: Recent Review Flowsheet Data    Depression screen [facility] 2/9 01/04/2020 08/22/2019 08/22/2019   Decreased Interest _0 Down, Depressed, Hopeless _1 PHQ - 2 Score _2 Altered sleeping _3 Tired, decreased energy _4 Change in appetite _5 Feeling bad or failure about yourself  _6 Trouble concentrating _7 Moving slowly or fidgety/restless 3 2 0   Suicidal thoughts 2  0 0   PHQ-9 Score _0 Difficult doing work/chores Very  difficult Very difficult Very difficult     Interpretation of Total Score  Total Score Depression Severity:  1-4 = Minimal depression, 5-9 = Mild depression, 10-14 = Moderate depression, 15-19 = Moderately severe depression, 20-27 = Severe depression   Psychosocial Evaluation and Intervention:  Psychosocial Evaluation - 12/26/19 0846      Psychosocial Evaluation & Interventions   Interventions Encouraged to exercise with the program and follow exercise prescription;Relaxation education;Stress management education    Comments Her son is a Airline 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 She gets of out breath alot and has chest pains sometimes and needs to lay down.    Expected Outcomes Short: Exercise regularly to support mental health and notify staff of any changes. Long: maintain mental health and well being through teaching of rehab or prescribed medications independently.    Continue Psychosocial Services  Follow up required by staff           Psychosocial Re-Evaluation:   Psychosocial Discharge (Final Psychosocial Re-Evaluation):   Vocational Rehabilitation: Provide vocational rehab assistance to qualifying candidates.   Vocational Rehab Evaluation & Intervention:   Education: Education Goals: Education classes will be provided on a variety of topics geared toward better understanding of heart health and risk factor modification. Participant will state understanding/return demonstration of topics presented as noted by education test scores.  Learning Barriers/Preferences:  Learning Barriers/Preferences - 12/26/19 0842      Learning Barriers/Preferences   Learning Barriers None    Learning Preferences None           General Cardiac Education Topics:  AED/CPR: - Group verbal and written instruction with the use of models to demonstrate the basic use of the AED with the basic ABC's of resuscitation.   Anatomy and Cardiac Procedures: - Group verbal and visual  presentation and models provide information about basic cardiac anatomy and function. Reviews the testing methods done to diagnose heart disease and the outcomes of the test results. Describes the treatment choices: Medical Management, Angioplasty, or Coronary Bypass Surgery for treating various heart conditions including Myocardial Infarction, Angina, Valve Disease, and Cardiac Arrhythmias.  Written material given at graduation.   Medication Safety: - Group verbal and visual instruction to review commonly prescribed medications for heart and lung disease. Reviews the medication, class of the drug, and side effects. Includes the steps to properly store meds and maintain the prescription regimen.  Written material given at graduation.   Intimacy: - Group verbal instruction through game format to discuss how heart and lung disease can affect sexual intimacy. Written material given at graduation..   Know Your Numbers and Heart Failure: - Group verbal and visual instruction to discuss disease risk factors for cardiac and pulmonary disease and treatment options.  Reviews associated critical values for Overweight/Obesity, Hypertension, Cholesterol, and Diabetes.  Discusses basics of heart failure: signs/symptoms and treatments.  Introduces Heart Failure Zone chart for action plan for heart failure.  Written material given at graduation.   Infection Prevention: - Provides verbal and written material to individual with discussion of infection control including proper hand washing and proper equipment cleaning during exercise session. Flowsheet Row Cardiac Rehab from 01/25/2020 in [facility] Cardiac and Pulmonary Rehab  Date 12/26/19  Educator [facility]  Instruction Review Code 1- Verbalizes Understanding      Falls Prevention: - Provides verbal and written material to individual with discussion of falls prevention and safety. Flowsheet Row  Cardiac Rehab from 01/25/2020 in [facility] Cardiac and Pulmonary Rehab  Date  12/26/19  Educator [facility]  Instruction Review Code 1- Verbalizes Understanding      Other: -Provides group and verbal instruction on various topics (see comments)   Knowledge Questionnaire Score:  Knowledge Questionnaire Score - 01/04/20 1352      Knowledge Questionnaire Score   Pre Score 19/26: A&P, Angina, PAD, Exercise, Tobacco           Core Components/Risk Factors/Patient Goals at Admission:  Personal Goals and Risk Factors at Admission - 01/04/20 1348      Core Components/Risk Factors/Patient Goals on Admission    Weight Management Yes;Weight Loss    Intervention Weight Management: Develop a combined nutrition and exercise program designed to reach desired caloric intake, while maintaining appropriate intake of nutrient and fiber, sodium and fats, and appropriate energy expenditure required for the weight goal.;Weight Management: Provide education and appropriate resources to help participant work on and attain dietary goals.;Weight Management/Obesity: Establish reasonable short term and long term weight goals.    Admit Weight 153 lb 1.6 oz (69.4 kg)    Goal Weight: Short Term 150 lb (68 kg)    Goal Weight: Long Term 147 lb (66.7 kg)    Expected Outcomes Short Term: Continue to assess and modify interventions until short term weight is achieved;Long Term: Adherence to nutrition and physical activity/exercise program aimed toward attainment of established weight goal;Understanding recommendations for meals to include 15-35% energy as protein, 25-35% energy from fat, 35-60% energy from carbohydrates, less than 295m of dietary cholesterol, 20-35 gm of total fiber daily;Understanding of distribution of calorie intake throughout the day with the consumption of 4-5 meals/snacks;Weight Loss: Understanding of general recommendations for a balanced deficit meal plan, which promotes 1-2 lb weight loss per week and includes a negative energy balance of [phone] kcal/d    Diabetes Yes     Intervention Provide education about signs/symptoms and action to take for hypo/hyperglycemia.;Provide education about proper nutrition, including hydration, and aerobic/resistive exercise prescription along with prescribed medications to achieve blood glucose in normal ranges: Fasting glucose 65-99 mg/dL    Expected Outcomes Short Term: Participant verbalizes understanding of the signs/symptoms and immediate care of hyper/hypoglycemia, proper foot care and importance of medication, aerobic/resistive exercise and nutrition plan for blood glucose control.;Long Term: Attainment of HbA1C < 7%.    Heart Failure Yes    Intervention Provide a combined exercise and nutrition program that is supplemented with education, support and counseling about heart failure. Directed toward relieving symptoms such as shortness of breath, decreased exercise tolerance, and extremity edema.    Expected Outcomes Improve functional capacity of life;Short term: Attendance in program 2-3 days a week with increased exercise capacity. Reported lower sodium intake. Reported increased fruit and vegetable intake. Reports medication compliance.;Short term: Daily weights obtained and reported for increase. Utilizing diuretic protocols set by physician.;Long term: Adoption of self-care skills and reduction of barriers for early signs and symptoms recognition and intervention leading to self-care maintenance.    Hypertension Yes    Intervention Provide education on lifestyle modifcations including regular physical activity/exercise, weight management, moderate sodium restriction and increased consumption of fresh fruit, vegetables, and low fat dairy, alcohol moderation, and smoking cessation.;Monitor prescription use compliance.    Expected Outcomes Short Term: Continued assessment and intervention until BP is < 140/973mHG in hypertensive participants. < 130/8014mG in hypertensive participants with diabetes, heart failure or chronic kidney  disease.;Long Term: Maintenance of blood pressure at goal levels.  Lipids Yes    Intervention Provide education and support for participant on nutrition & aerobic/resistive exercise along with prescribed medications to achieve LDL <21m, HDL >465m    Expected Outcomes Short Term: Participant states understanding of desired cholesterol values and is compliant with medications prescribed. Participant is following exercise prescription and nutrition guidelines.;Long Term: Cholesterol controlled with medications as prescribed, with individualized exercise RX and with personalized nutrition plan. Value goals: LDL < 7031mHDL > 40 mg.    Stress Yes    Intervention Offer individual and/or small group education and counseling on adjustment to heart disease, stress management and health-related lifestyle change. Teach and support self-help strategies.;Refer participants experiencing significant psychosocial distress to appropriate mental health specialists for further evaluation and treatment. When possible, include family members and significant others in education/counseling sessions.    Expected Outcomes Short Term: Participant demonstrates changes in health-related behavior, relaxation and other stress management skills, ability to obtain effective social support, and compliance with psychotropic medications if prescribed.;Long Term: Emotional wellbeing is indicated by absence of clinically significant psychosocial distress or social isolation.           Education:Diabetes - Individual verbal and written instruction to review signs/symptoms of diabetes, desired ranges of glucose level fasting, after meals and with exercise. Acknowledge that pre and post exercise glucose checks will be done for 3 sessions at entry of program. [facility]  Date 12/26/19  [facility]- Verbalizes Understanding      Core  Components/Risk Factors/Patient Goals Review:    Core Components/Risk Factors/Patient Goals at Discharge (Final Review):    ITP Comments:  ITP Comments    Row Name 12/26/19 0842 01/04/20 1201 01/11/20 0750 01/29/20 1130 01/31/20 0726   ITP Comments Virtual Visit completed. Patient informed on EP and RD appointment and 6 Minute walk test. Patient also informed of patient health questionnaires on My Chart. Patient Verbalizes understanding. [facility] Completed 6MWT and gym orientation. Initial ITP created and sent for review to Dr. MarEmily Filbertedical Director. First full day of exercise!  Patient was oriented to gym and equipment including functions, settings, policies, and procedures.  Patient's individual exercise prescription and treatment plan were reviewed.  All starting workloads were established based on the results of the 6 minute walk test done at initial orientation visit.  The plan for exercise progression was also introduced and progression will be customized based on patient's performance and goals. [name] called to inform staff [facility] They are running tests and she said she expects to be there for 10 days. Staff will follow up in two weeks to see how she is feeling. 30 Day review completed. Medical Director ITP review done, changes made as directed, and signed approval by Medical Director.   [place] 02/05/20 1137 02/27/20 0842 02/28/20 0955 03/12/20 0735 03/19/20 0843   ITP Comments [name] has been in the hospital for other health concerns and hasn't attended since last review. [name] not complete her rehab session.  [name] came in with elevated HR  135  Then had a 30-60 second run of v tach.   No symptoms except feeling her heart racing.  She was taken via wheelchair to her cardiology office to be seen by provider in the office. 30 Day review completed. Medical Director ITP review done, changes  made as directed, and signed approval by Medical Director.  Cardiologist looked at rhythm strips yesterday and determined it was all artifact. She was instructed to fill her carvedilol RX and start taking. Patient called to let us know that she was just released from the hospital on Saturday from her GI bleed.  They cauterized it and she has a colonoscopy scheduled for Thursday as part of follow up.  She is still feeling very weak.  She will be out this week and next week to recover. Pt cancelled her appt today via MyChart and stated that she did not feel like she could attend at this time.  She is still having some issues with her GI bleed.  Sent her a message back about options, waiting for reply.   [place] Name 03/19/20 1150 03/27/20 0709         ITP Comments [name] is going to go out on medical hold for at least two weeks.  We will reach back out to her on 04/02/20 if we do not hear from her before then. 30 Day review completed. Medical Director ITP review done, changes made as directed, and signed approval by Medical Director.             Comments: discharge ITP

## 2020-04-09 ENCOUNTER — Encounter: Payer: Self-pay | Admitting: Oncology

## 2020-04-09 ENCOUNTER — Encounter: Payer: Self-pay | Admitting: *Deleted

## 2020-04-09 ENCOUNTER — Inpatient Hospital Stay: Payer: Medicare Other | Admitting: Oncology

## 2020-04-09 ENCOUNTER — Telehealth: Payer: Self-pay

## 2020-04-09 ENCOUNTER — Inpatient Hospital Stay: Payer: Medicare Other

## 2020-04-09 DIAGNOSIS — D509 Iron deficiency anemia, unspecified: Secondary | ICD-10-CM

## 2020-04-09 NOTE — Telephone Encounter (Signed)
Copied from [place] [id number]. Topic: General - Other >> Apr 09, 2020  5:15 PM Leonides Schanz, Ja-Kwan wrote: Reason for CRM: Stanton Kidney with [facility] reports that patient complained of migraine 10 out of 10. Mary reports patient blood pressure reading for right arm was 149/102 and left arm 152/108. Stanton Kidney also stated patient informed her that she forgot to take blood pressure medication last night. Cb# [id number]

## 2020-04-10 NOTE — Telephone Encounter (Signed)
I called and spoke with patient. She says her migraine is much better today. She rates her migraine a 7 out of 10. She says she has a history of chronic migraines has been taking Ajovy injections for migraine treatment. Patient also takes Amitriptyline to help with Migraines. She denies any numbness, chest pain or speech difficulty. Patient checked her blood pressure reading while on the phone with me and the result was 109/ 52 with a heart rate of 87. Patient says she missed her last hospital follow up with Dr. Caryn Section that was scheduled on 04/03/2020. Patient wants to know when she needs to come back in for a follow up? Please advise.

## 2020-04-10 NOTE — Telephone Encounter (Signed)
I called pt and together we scheduled an 8 wk OV f/u

## 2020-04-10 NOTE — Telephone Encounter (Signed)
Need to schedule follow up in about 8 weeks.

## 2020-04-12 ENCOUNTER — Encounter: Payer: Self-pay | Admitting: Family Medicine

## 2020-04-13 NOTE — Progress Notes (Signed)
[place]  [phone] [phone]  ID: [name] OB: October 04, 1970  MR#: [id number]  CHE#:[id number]  Patient Care Team: [name], MD as PCP - General (Family Medicine) [name] Kathlene November, MD as PCP - Cardiology (Cardiology) Vladimir Crofts, MD as Consulting Physician (Neurology) Anthonette Legato, MD as Consulting Physician (Nephrology) Lloyd Huger, MD as Consulting Physician (Oncology) Poggi, Marshall Cork, MD as Consulting Physician (Orthopedic Surgery) Warnell Forester, NP as Nurse Practitioner (Endocrinology) Caroline More, DPM as Consulting Physician (Podiatry) Copland, Ginette Otto as Referring Physician (Obstetrics and Gynecology) Danice Goltz, MD as Consulting Physician (Ophthalmology) Carloyn Manner, MD as Referring Physician (Otolaryngology)   CHIEF COMPLAINT: Iron deficiency anemia.  INTERVAL HISTORY: Patient returns to clinic today for repeat laboratory work and further evaluation.  She was admitted to the hospital several months ago with significant GI bleed requiring multiple blood transfusions.  She continues to have weakness and fatigue, but this has improved.  She otherwise feels well.  She has no neurologic complaints.  She denies any recent fevers or illnesses.  She has a good appetite and denies weight loss.  She has no chest pain, shortness of breath, cough, or hemoptysis.  She denies any nausea, vomiting, constipation, or diarrhea.  She has no further melena or hematochezia.  She has no urinary complaints.  Patient offers no further specific complaints today.  REVIEW OF SYSTEMS:   Review of Systems  Constitutional: Positive for malaise/fatigue. Negative for fever and weight loss.  Respiratory: Negative.  Negative for cough and shortness of breath.   Cardiovascular: Negative.  Negative for chest pain, palpitations and leg swelling.  Gastrointestinal: Negative.  Negative for blood in stool,  diarrhea and melena.  Genitourinary: Negative.  Negative for hematuria.  Musculoskeletal: Negative.  Negative for back pain.  Skin: Negative.  Negative for rash.  Neurological: Positive for weakness. Negative for dizziness, sensory change, focal weakness and headaches.  Psychiatric/Behavioral: Negative.  The patient is not nervous/anxious.     As per HPI. Otherwise, a complete review of systems is negative.  PAST MEDICAL HISTORY: Past Medical History:  Diagnosis Date  . Acute colitis 01/27/2017  . Acute pyelonephritis   . Anemia    iron deficiency anemia  . Aortic arch aneurysm ([place])   . BRCA negative 2014  . CAD (coronary artery disease)    a. 08/2003 Cath: LAD 30-40-med Rx; b. 11/2014 PCI: LAD 60m(3.25x23 Xience Alpine DES); c. 06/2015 PCI: D1 (2.25x12 Resolute Integrity DES); d. 06/2017 PCI: Patent mLAD stent, D2 95 (PTCA); e. 09/2017 PCI: D2 99ost (CBA); d. 12/2017 Cath: LM nl, LAD 357m80d (small), D1 40ost, D2 95ost, LCX 40p, RCA 40ost/p->Med rx for D2 given restenosis.  . Colitis 06/03/2015  . Colon polyp   . CVA (cerebral vascular accident) ([place]   Left side weakness.   . Degenerative tear of glenoid labrum of right shoulder 03/15/2017  . Diabetes mellitus without complication ([facility]  . Family history of breast cancer    BRCA neg 2014  . Femur fracture, left ([facility] [place]9/10/2018  . Gastric ulcer 04/27/2011  . History of echocardiogram    a. 03/2017 Echo: EF 60-65%, no rwma; b. 02/2018 Echo: EF 60-65%, no rwma. Nl RV fxn. No cardiac source of emboli (admitted w/ stroke).  . Hypertension   . Malignant melanoma of skin of scalp ([facility]  . MI, acute, non ST segment elevation ([facility]  . Neuromuscular disorder ([facility]  . S/P  drug eluting coronary stent placement 06/04/2015  . Sepsis (H[facility]) 03/18/2017  . Stroke [facility])    a. 02/2018 MRI: 43mm late acute/early subacute L medial frontal lobe inarct; b. 02/2018 MRA No large vessel occlusion or aneurysm. Mod to sev L P2 stenosis. thready L vertebral  artery, diffusely dzs'd; c. 02/2018 Carotid U/S: <50% bilat ICA dzs.    PAST SURGICAL HISTORY: Past Surgical History:  Procedure Laterality Date  . APPENDECTOMY    . BALLOON ENTEROSCOPY  02/06/2020   [facility]  . BIOPSY N/A 03/14/2020   Procedure: BIOPSY;  Surgeon: Midge Minium, MD;  Location: [facility] SURGERY CNTR;  Service: Endoscopy;  Laterality: N/A;  . CARDIAC CATHETERIZATION N/A 11/09/2014   Procedure: Coronary Angiography;  Surgeon: Antonieta Iba, MD;  Location: [facility] INVASIVE CV LAB;  Service: Cardiovascular;  Laterality: N/A;  . CARDIAC CATHETERIZATION N/A 11/12/2014   Procedure: Coronary Stent Intervention;  Surgeon: Marcina Millard, MD;  Location: [facility] INVASIVE CV LAB;  Service: Cardiovascular;  Laterality: N/A;  . CARDIAC CATHETERIZATION N/A 04/18/2015   Procedure: Left Heart Cath and Coronary Angiography;  Surgeon: Antonieta Iba, MD;  Location: [facility] INVASIVE CV LAB;  Service: Cardiovascular;  Laterality: N/A;  . CARDIAC CATHETERIZATION Left 06/04/2015   Procedure: Left Heart Cath and Coronary Angiography;  Surgeon: Iran Ouch, MD;  Location: [facility] INVASIVE CV LAB;  Service: Cardiovascular;  Laterality: Left;  . CARDIAC CATHETERIZATION N/A 06/04/2015   Procedure: Coronary Stent Intervention;  Surgeon: Iran Ouch, MD;  Location: [facility] INVASIVE CV LAB;  Service: Cardiovascular;  Laterality: N/A;  . CESAREAN SECTION  2001  . CHOLECYSTECTOMY N/A 11/18/2016   Procedure: LAPAROSCOPIC CHOLECYSTECTOMY WITH INTRAOPERATIVE CHOLANGIOGRAM;  Surgeon: Kieth Brightly, MD;  Location: [facility] ORS;  Service: General;  Laterality: N/A;  . COLONOSCOPY WITH PROPOFOL N/A 04/27/2016   Procedure: COLONOSCOPY WITH PROPOFOL;  Surgeon: Midge Minium, MD;  Location: [facility] SURGERY CNTR;  Service: Endoscopy;  Laterality: N/A;  . COLONOSCOPY WITH PROPOFOL N/A 01/12/2018   Procedure: COLONOSCOPY WITH PROPOFOL;  Surgeon: Toledo, Boykin Nearing, MD;  Location: [facility] ENDOSCOPY;  Service: Endoscopy;  Laterality:  N/A;  . COLONOSCOPY WITH PROPOFOL N/A 03/14/2020   Procedure: COLONOSCOPY WITH PROPOFOL;  Surgeon: Midge Minium, MD;  Location: [facility] SURGERY CNTR;  Service: Endoscopy;  Laterality: N/A;  Needs to be scheduled after 7:30 due to driver issues  . CORONARY ANGIOPLASTY    . CORONARY BALLOON ANGIOPLASTY N/A 06/29/2017   Procedure: CORONARY BALLOON ANGIOPLASTY;  Surgeon: Iran Ouch, MD;  Location: [facility] INVASIVE CV LAB;  Service: Cardiovascular;  Laterality: N/A;  . CORONARY BALLOON ANGIOPLASTY N/A 09/20/2017   Procedure: CORONARY BALLOON ANGIOPLASTY;  Surgeon: Iran Ouch, MD;  Location: [facility] INVASIVE CV LAB;  Service: Cardiovascular;  Laterality: N/A;  . CORONARY STENT INTERVENTION N/A 12/13/2019   Procedure: CORONARY STENT INTERVENTION;  Surgeon: Iran Ouch, MD;  [facility] CV LAB;  Service: Cardiovascular;  Laterality: N/A;  . DILATION AND CURETTAGE OF UTERUS    . ESOPHAGOGASTRODUODENOSCOPY (EGD) WITH PROPOFOL N/A 09/14/2014   Procedure: ESOPHAGOGASTRODUODENOSCOPY (EGD) WITH PROPOFOL;  Surgeon: Elnita Maxwell, MD;  Location: [facility] ENDOSCOPY;  Service: Endoscopy;  Laterality: N/A;  . ESOPHAGOGASTRODUODENOSCOPY (EGD) WITH PROPOFOL N/A 04/27/2016   Procedure: ESOPHAGOGASTRODUODENOSCOPY (EGD) WITH PROPOFOL;  Surgeon: Midge Minium, MD;  Location: [facility] SURGERY CNTR;  Service: Endoscopy;  Laterality: N/A;  Diabetic - oral meds  . ESOPHAGOGASTRODUODENOSCOPY (EGD) WITH PROPOFOL N/A 01/12/2018   Procedure: ESOPHAGOGASTRODUODENOSCOPY (EGD) WITH PROPOFOL;  Surgeon: Toledo, Boykin Nearing, MD;  Location: [facility] ENDOSCOPY;  Service: Endoscopy;  Laterality: N/A;  . ESOPHAGOGASTRODUODENOSCOPY (EGD) WITH PROPOFOL N/A 04/11/2019   Procedure: ESOPHAGOGASTRODUODENOSCOPY (EGD) WITH PROPOFOL;  Surgeon: [name], MD;  Location: [facility] ENDOSCOPY;  Service: Endoscopy;  Laterality: N/A;  . ESOPHAGOGASTRODUODENOSCOPY (EGD) WITH PROPOFOL N/A 03/08/2020   Procedure: ESOPHAGOGASTRODUODENOSCOPY (EGD) WITH  PROPOFOL;  Surgeon: [name], MD;  Location: [facility] ENDOSCOPY;  Service: Endoscopy;  Laterality: N/A;  . GASTRIC BYPASS  09/2009   [place] (IM) NAIL INTERTROCHANTERIC Left 10/13/2018   Procedure: INTRAMEDULLARY (IM) NAIL INTERTROCHANTRIC;  Surgeon: Leandrew Koyanagi, MD;  Location: [place];  Service: Orthopedics;  Laterality: Left;  . Left Carotid to sublcavian artery bypass w/ subclavian artery ligation     a. Performed @ [place].  . LEFT HEART CATH AND CORONARY ANGIOGRAPHY Left 06/29/2017   Procedure: LEFT HEART CATH AND CORONARY ANGIOGRAPHY;  Surgeon: Wellington Hampshire, MD;  Location: [place] CV LAB;  Service: Cardiovascular;  Laterality: Left;  . LEFT HEART CATH AND CORONARY ANGIOGRAPHY N/A 09/20/2017   Procedure: LEFT HEART CATH AND CORONARY ANGIOGRAPHY;  Surgeon: Wellington Hampshire, MD;  Location: [place] CV LAB;  Service: Cardiovascular;  Laterality: N/A;  . LEFT HEART CATH AND CORONARY ANGIOGRAPHY N/A 12/20/2017   Procedure: LEFT HEART CATH AND CORONARY ANGIOGRAPHY;  Surgeon: Wellington Hampshire, MD;  Location: [place] CV LAB;  Service: Cardiovascular;  Laterality: N/A;  . LEFT HEART CATH AND CORONARY ANGIOGRAPHY N/A 04/20/2019   Procedure: LEFT HEART CATH AND CORONARY ANGIOGRAPHY possible PCI;  Surgeon: Minna Merritts, MD;  Location: [place] CV LAB;  Service: Cardiovascular;  Laterality: N/A;  . LEFT HEART CATH AND CORONARY ANGIOGRAPHY N/A 12/13/2019   Procedure: LEFT HEART CATH AND CORONARY ANGIOGRAPHY;  Surgeon: Wellington Hampshire, MD;  Location: [place] CV LAB;  Service: Cardiovascular;  Laterality: N/A;  . MELANOMA EXCISION  2016   Dr. Evorn Gong  . [place]  2002  . RIGHT OOPHORECTOMY    . SHOULDER ARTHROSCOPY WITH OPEN ROTATOR CUFF REPAIR Right 01/07/2016   Procedure: SHOULDER ARTHROSCOPY WITH DEBRIDMENT, SUBACHROMIAL DECOMPRESSION;  Surgeon: Corky Mull, MD;  Location: [facility] ORS;  Service: Orthopedics;  Laterality:  Right;  . SHOULDER ARTHROSCOPY WITH OPEN ROTATOR CUFF REPAIR Right 03/16/2017   Procedure: SHOULDER ARTHROSCOPY WITH OPEN ROTATOR CUFF REPAIR POSSIBLE BICEPS TENODESIS;  Surgeon: Corky Mull, MD;  Location: [facility] ORS;  Service: Orthopedics;  Laterality: Right;  . TRIGGER FINGER RELEASE Right     [place]    FAMILY HISTORY Family History  Problem Relation Age of Onset  . Hypertension Mother   . Anxiety disorder Mother   . Depression Mother   . Bipolar disorder Mother   . Heart disease Mother        No details  . Hyperlipidemia Mother   . Kidney disease Father   . Heart disease Father 78  . Hypertension Father   . Diabetes Father   . Stroke Father   . Colon cancer Father        dx in his 40's  . Anxiety disorder Father   . Depression Father   . Skin cancer Father   . Kidney disease Sister   . Thyroid nodules Sister   . Hypertension Sister   . Hypertension Sister   . Diabetes Sister   . Hyperlipidemia Sister   . Depression Sister   . Breast cancer Maternal Aunt 47  . Breast cancer Maternal Aunt 65  . Ovarian cancer Cousin   .  Colon cancer Cousin   . Kidney cancer Cousin   . Breast cancer Other   . Bladder Cancer Neg Hx        ADVANCED DIRECTIVES:    HEALTH MAINTENANCE: Social History   Tobacco Use  . Smoking status: Former Smoker    Packs/day: 0.25    Years: 1.00    Pack years: 0.25    Types: Cigarettes    Quit date: 08/31/1994    Years since quitting: 25.6  . Smokeless tobacco: Never Used  . Tobacco comment: quit 28 years ago  Vaping Use  . Vaping Use: Never used  Substance Use Topics  . Alcohol use: No    Alcohol/week: 0.0 standard drinks  . Drug use: No     Allergies  Allergen Reactions  . Lipitor [Atorvastatin] Other (See Comments)    Leg pains  . Tramadol Other (See Comments)    Mouth feels like it's on fire    Current Outpatient Medications  Medication Sig Dispense Refill  . alosetron (LOTRONEX) 1 MG tablet Take 1 tablet (1 mg  total) by mouth 2 (two) times daily. 60 tablet 5  . ALPRAZolam (XANAX) 1 MG tablet Take 1 tablet by mouth twice daily as needed 60 tablet 5  . amLODipine (NORVASC) 2.5 MG tablet Take 1 tablet (2.5 mg total) by mouth daily. 90 tablet 3  . buPROPion (WELLBUTRIN SR) 200 MG 12 hr tablet Take 200 mg by mouth every morning.    . carvedilol (COREG) 25 MG tablet Take 1 tablet (25 mg total) by mouth 2 (two) times daily with a meal. 180 tablet 3  . desvenlafaxine (PRISTIQ) 50 MG 24 hr tablet Take 50 mg by mouth daily.    . diphenoxylate-atropine (LOMOTIL) 2.5-0.025 MG tablet Take 1 tablet by mouth 4 (four) times daily as needed for diarrhea or loose stools. 120 tablet 3  . EUTHYROX 25 MCG tablet TAKE 1 TABLET BY MOUTH ONCE DAILY BEFORE BREAKFAST 90 tablet 3  . famotidine (PEPCID) 20 MG tablet Take 20 mg by mouth 2 (two) times daily.    Marland Kitchen gabapentin (NEURONTIN) 300 MG capsule Take 2 capsules (600 mg total) by mouth 2 (two) times daily. 360 capsule 4  . Galcanezumab-gnlm (EMGALITY) 120 MG/ML SOAJ Inject 120 mg into the skin every 28 (twenty-eight) days.    . isosorbide mononitrate (IMDUR) 120 MG 24 hr tablet Take 1 tablet (120 mg total) by mouth daily. 30 tablet 6  . nitroGLYCERIN (NITROSTAT) 0.4 MG SL tablet Place 1 tablet (0.4 mg total) under the tongue every 5 (five) minutes as needed for chest pain. 25 tablet 2  . Polyethyl Glycol-Propyl Glycol (SYSTANE OP) Place 1 drop into both eyes daily as needed (dry eyes).    . promethazine (PHENERGAN) 25 MG tablet TAKE 1 TABLET BY MOUTH EVERY 6 HOURS AS NEEDED FOR NAUSEA FOR VOMITING 30 tablet 1  . rosuvastatin (CRESTOR) 40 MG tablet Take 1 tablet (40 mg total) by mouth daily at 6 PM. 90 tablet 3  . traZODone (DESYREL) 100 MG tablet TAKE 1 TO 2 TABLETS BY MOUTH AT BEDTIME 30 tablet 5  . HYDROcodone-acetaminophen (NORCO) 5-325 MG tablet Take 1 tablet by mouth every 6 (six) hours as needed for moderate pain. (Patient not taking: Reported on 04/16/2020) 8 tablet 0  .  UBRELVY 100 MG TABS Take 100 mg by mouth 2 (two) times daily as needed (migraine).  (Patient not taking: No sig reported)     No current facility-administered medications for this visit.  OBJECTIVE: Vitals:   04/16/20 1335  BP: (!) 128/92  Pulse: 71  Resp: 18  Temp: 97.6 F (36.4 C)  SpO2: 100%     Body mass index is 25.63 kg/m.    ECOG FS:0 - Asymptomatic  General: Well-developed, well-nourished, no acute distress. Eyes: Pink conjunctiva, anicteric sclera. HEENT: Normocephalic, moist mucous membranes. Lungs: No audible wheezing or coughing. Heart: Regular rate and rhythm. Abdomen: Soft, nontender, no obvious distention. Musculoskeletal: No edema, cyanosis, or clubbing. Neuro: Alert, answering all questions appropriately. Cranial nerves grossly intact. Skin: No rashes or petechiae noted. Psych: Normal affect.   LAB RESULTS:  Lab Results  Component Value Date   NA 137 03/13/2020   K 5.4 (H) 03/13/2020   CL 104 03/13/2020   CO2 17 (L) 03/13/2020   GLUCOSE 109 (H) 03/13/2020   BUN 30 (H) 03/13/2020   CREATININE 2.21 (H) 03/13/2020   CALCIUM 8.8 03/13/2020   PROT 5.9 (L) 03/13/2020   ALBUMIN 3.3 (L) 03/13/2020   AST 14 03/13/2020   ALT 10 03/13/2020   ALKPHOS 141 (H) 03/13/2020   BILITOT 0.2 03/13/2020   GFRNONAA 25 (L) 03/13/2020   GFRAA 29 (L) 03/13/2020    Lab Results  Component Value Date   WBC 8.8 03/13/2020   NEUTROABS 4.6 12/07/2019   HGB 10.2 (L) 03/13/2020   HCT 32.5 (L) 03/13/2020   MCV 87 03/13/2020   PLT 265 03/13/2020   Lab Results  Component Value Date   IRON 23 (L) 04/15/2020   TIBC 175 (L) 04/15/2020   IRONPCTSAT 13 04/15/2020    Lab Results  Component Value Date   FERRITIN 15 04/15/2020     STUDIES: DG Chest 2 View  Result Date: 03/31/2020 CLINICAL DATA:  Chest pain after fall down hill on Friday. EXAM: CHEST - 2 VIEW COMPARISON:  Chest radiograph December 12, 2019 FINDINGS: The heart size and mediastinal contours are  unchanged. Right-sided aortic arch on vascular ring better characterized on prior CT chest. Surgical clips project in the region of the left thoracic inlet. Both lungs are clear. The visualized skeletal structures are unchanged. IMPRESSION: No acute cardiopulmonary disease. Electronically Signed   By: Dahlia Bailiff MD   On: 03/31/2020 09:10   DG Ribs Unilateral Right  Result Date: 03/31/2020 CLINICAL DATA:  Pain after fall down hill on Friday EXAM: RIGHT RIBS - 2 VIEW COMPARISON:  Chest radiograph December 12, 2019 FINDINGS: No displaced fracture or other bone lesions are seen involving the ribs. Cholecystectomy clips. Surgical clips project over the left thoracic inlet. IMPRESSION: No displaced rib fracture visualized. Electronically Signed   By: Dahlia Bailiff MD   On: 03/31/2020 09:15   DG Lumbar Spine 2-3 Views  Result Date: 03/31/2020 CLINICAL DATA:  Pain after fall down hill on Friday EXAM: LUMBAR SPINE - 2-3 VIEW COMPARISON:  Lumbar spine radiographs November 04, 2017 FINDINGS: There is no evidence of lumbar spine fracture. Alignment is normal. Similar mild degenerative change. Cholecystectomy clips. Surgical clip overlies the left lower quadrant. IMPRESSION: No acute osseous abnormality. Mild degenerative changes, similar to prior studies Electronically Signed   By: Dahlia Bailiff MD   On: 03/31/2020 09:14   DG HIP UNILAT WITH PELVIS 2-3 VIEWS LEFT  Result Date: 03/31/2020 CLINICAL DATA:  Pain after fall down hill on Friday EXAM: DG HIP (WITH OR WITHOUT PELVIS) 2-3V LEFT COMPARISON:  Femur radiograph January 10, 2019. FINDINGS: Partially visualized intramedullary rod and nail fixation of the femur. There is no evidence of hip fracture  or dislocation. There is no evidence of arthropathy or other focal bone abnormality. Pelvic phleboliths. Vascular calcifications. Surgical clip overlies the left lower quadrant. IMPRESSION: No acute osseous abnormality. Electronically Signed   By: Dahlia Bailiff MD    On: 03/31/2020 09:12    ASSESSMENT: Iron deficiency anemia.  PLAN:    1. Iron deficiency anemia: No CBC was drawn today, but iron stores are mildly decreased.  Patient also had recent GI bleed requiring multiple blood transfusions. She likely has poor absorption given her history of gastric bypass surgery.  Previously, her entire anemia work-up was either negative or within normal limits.  Feraheme was switched to Venofer for secondary to insurance reasons.  Proceed with 200 mg IV Venofer today.  Return to clinic in 3 months with repeat laboratory work, further evaluation, and consideration of additional IV Venofer.   2.  Ulcer: Patient does not report any active bleeding.  Continue follow-up with GI as indicated. 3.  Chronic renal insufficiency: Patient's most recent creatinine on March 13, 2018 was 2.21.   I spent a total of 30 minutes reviewing chart data, face-to-face evaluation with the patient, counseling and coordination of care as detailed above.   Patient expressed understanding and was in agreement with this plan. She also understands that She can call clinic at any time with any questions, concerns, or complaints.   Lloyd Huger, MD   04/16/2020 1:55 PM

## 2020-04-15 ENCOUNTER — Inpatient Hospital Stay: Payer: Medicare Other

## 2020-04-15 DIAGNOSIS — D509 Iron deficiency anemia, unspecified: Secondary | ICD-10-CM

## 2020-04-15 LAB — IRON AND TIBC
Iron: 23 ug/dL — ABNORMAL LOW (ref 28–170)
Saturation Ratios: 13 % (ref 10.4–31.8)
TIBC: 175 ug/dL — ABNORMAL LOW (ref 250–450)
UIBC: 152 ug/dL

## 2020-04-15 LAB — FERRITIN: Ferritin: 15 ng/mL (ref 11–307)

## 2020-04-16 ENCOUNTER — Inpatient Hospital Stay: Payer: Medicare Other

## 2020-04-16 ENCOUNTER — Encounter: Payer: Self-pay | Admitting: Oncology

## 2020-04-16 ENCOUNTER — Inpatient Hospital Stay (HOSPITAL_BASED_OUTPATIENT_CLINIC_OR_DEPARTMENT_OTHER): Payer: Medicare Other | Admitting: Oncology

## 2020-04-16 VITALS — BP 128/92 | HR 71 | Temp 97.6°F | Resp 18 | Wt 144.7 lb

## 2020-04-16 VITALS — BP 144/76 | HR 60 | Resp 16

## 2020-04-16 DIAGNOSIS — D509 Iron deficiency anemia, unspecified: Secondary | ICD-10-CM

## 2020-04-16 DIAGNOSIS — D508 Other iron deficiency anemias: Secondary | ICD-10-CM

## 2020-04-16 MED ORDER — SODIUM CHLORIDE 0.9 % IV SOLN
Freq: Once | INTRAVENOUS | Status: AC
  Filled 2020-04-16: qty 250

## 2020-04-16 MED ORDER — IRON SUCROSE 20 MG/ML IV SOLN
200.0000 mg | Freq: Once | INTRAVENOUS | Status: AC
  Administered 2020-04-16: 200 mg via INTRAVENOUS
  Filled 2020-04-16: qty 10

## 2020-04-16 MED ORDER — SODIUM CHLORIDE 0.9 % IV SOLN: 200.0000 mg | Freq: Once | INTRAVENOUS | Status: DC

## 2020-04-16 NOTE — Progress Notes (Signed)
Pt in for follow up, states recently was hospitalized.  Surgeon found a hernia that was bleeding, has been repaired.

## 2020-04-17 ENCOUNTER — Ambulatory Visit: Payer: Self-pay | Admitting: Physician Assistant

## 2020-04-17 ENCOUNTER — Encounter: Payer: Self-pay | Admitting: Family Medicine

## 2020-04-18 ENCOUNTER — Other Ambulatory Visit: Payer: Self-pay

## 2020-04-18 ENCOUNTER — Encounter: Payer: Self-pay | Admitting: Adult Health

## 2020-04-18 ENCOUNTER — Ambulatory Visit (INDEPENDENT_AMBULATORY_CARE_PROVIDER_SITE_OTHER): Payer: Medicare Other | Admitting: Adult Health

## 2020-04-18 VITALS — BP 105/67 | HR 79 | Temp 98.1°F | Resp 15 | Wt 145.4 lb

## 2020-04-18 DIAGNOSIS — R300 Dysuria: Secondary | ICD-10-CM | POA: Diagnosis not present

## 2020-04-18 DIAGNOSIS — R5383 Other fatigue: Secondary | ICD-10-CM

## 2020-04-18 DIAGNOSIS — N760 Acute vaginitis: Secondary | ICD-10-CM | POA: Diagnosis not present

## 2020-04-18 DIAGNOSIS — N390 Urinary tract infection, site not specified: Secondary | ICD-10-CM

## 2020-04-18 LAB — POCT URINALYSIS DIPSTICK
Bilirubin, UA: NEGATIVE
Glucose, UA: NEGATIVE
Ketones, UA: NEGATIVE
Nitrite, UA: NEGATIVE
Protein, UA: POSITIVE — AB
Spec Grav, UA: 1.01 (ref 1.010–1.025)
Urobilinogen, UA: 0.2 E.U./dL
pH, UA: 5 (ref 5.0–8.0)

## 2020-04-18 MED ORDER — TERCONAZOLE 0.4 % VA CREA: 1.0000 | TOPICAL_CREAM | Freq: Every day | VAGINAL | 0 refills | Status: DC

## 2020-04-18 MED ORDER — DOXYCYCLINE HYCLATE 100 MG PO TABS: 100.0000 mg | ORAL_TABLET | Freq: Two times a day (BID) | ORAL | 0 refills | Status: DC

## 2020-04-18 MED ORDER — METRONIDAZOLE 0.75 % VA GEL: 1.0000 | Freq: Every day | VAGINAL | 0 refills | Status: DC

## 2020-04-18 NOTE — Progress Notes (Signed)
Sent urine for culture and given doxycycline will call if needs changing per culture results. Signs of kidney infection also discussed and when to seek care immediately at office visit.

## 2020-04-18 NOTE — Patient Instructions (Signed)
Urinary Tract Infection, Adult A urinary tract infection (UTI) is an infection of any part of the urinary tract. The urinary tract includes:  The kidneys.  The ureters.  The bladder.  The urethra. These organs make, store, and get rid of pee (urine) in the body. What are the causes? This infection is caused by germs (bacteria) in your genital area. These germs grow and cause swelling (inflammation) of your urinary tract. What increases the risk? The following factors may make you more likely to develop this condition:  Using a small, thin tube (catheter) to drain pee.  Not being able to control when you pee or poop (incontinence).  Being female. If you are female, these things can increase the risk: ? Using these methods to prevent pregnancy:  A medicine that kills sperm (spermicide).  A device that blocks sperm (diaphragm). ? Having low levels of a female hormone (estrogen). ? Being pregnant. You are more likely to develop this condition if:  You have genes that add to your risk.  You are sexually active.  You take antibiotic medicines.  You have trouble peeing because of: ? A prostate that is bigger than normal, if you are female. ? A blockage in the part of your body that drains pee from the bladder. ? A kidney stone. ? A nerve condition that affects your bladder. ? Not getting enough to drink. ? Not peeing often enough.  You have other conditions, such as: ? Diabetes. ? A weak disease-fighting system (immune system). ? Sickle cell disease. ? Gout. ? Injury of the spine. What are the signs or symptoms? Symptoms of this condition include:  Needing to pee right away.  Peeing small amounts often.  Pain or burning when peeing.  Blood in the pee.  Pee that smells bad or not like normal.  Trouble peeing.  Pee that is cloudy.  Fluid coming from the vagina, if you are female.  Pain in the belly or lower back. Other symptoms include:  Vomiting.  Not  feeling hungry.  Feeling mixed up (confused). This may be the first symptom in older adults.  Being tired and grouchy (irritable).  A fever.  Watery poop (diarrhea). How is this treated?  Taking antibiotic medicine.  Taking other medicines.  Drinking enough water. In some cases, you may need to see a specialist. Follow these instructions at home: Medicines  Take over-the-counter and prescription medicines only as told by your doctor.  If you were prescribed an antibiotic medicine, take it as told by your doctor. Do not stop taking it even if you start to feel better. General instructions  Make sure you: ? Pee until your bladder is empty. ? Do not hold pee for a long time. ? Empty your bladder after sex. ? Wipe from front to back after peeing or pooping if you are a female. Use each tissue one time when you wipe.  Drink enough fluid to keep your pee pale yellow.  Keep all follow-up visits.   Contact a doctor if:  You do not get better after 1-2 days.  Your symptoms go away and then come back. Get help right away if:  You have very bad back pain.  You have very bad pain in your lower belly.  You have a fever.  You have chills.  You feeling like you will vomit or you vomit. Summary  A urinary tract infection (UTI) is an infection of any part of the urinary tract.  This condition is caused by   germs in your genital area.  There are many risk factors for a UTI.  Treatment includes antibiotic medicines.  Drink enough fluid to keep your pee pale yellow. This information is not intended to replace advice given to you by your health care provider. Make sure you discuss any questions you have with your health care provider. Document Revised: 09/01/2019 Document Reviewed: 09/01/2019 Elsevier Patient Education  2021 [place]. Vaginitis  Vaginitis is irritation and swelling of the vagina. Treatment will depend on the cause. What are the causes? It can be  caused by:  Bacteria.  Yeast.  A parasite.  A virus.  Low hormone levels.  Bubble baths, scented tampons, and feminine sprays. Other things can change the balance of the yeast and bacteria that live in the vagina. These include:  Antibiotic medicines.  Not being clean enough.  Some birth control methods.  Sex.  Infection.  Diabetes.  A weakened body defense system (immune system). What increases the risk?  Smoking or being around someone who smokes.  Using washes (douches), scented tampons, or scented pads.  Wearing tight pants or thong underwear.  Using birth control pills or an IUD.  Having sex without a condom or having a lot of partners.  Having an STI.  Using a certain product to kill sperm (nonoxynol-9).  Eating foods that are high in sugar.  Having diabetes.  Having low levels of a female hormone.  Having a weakened body defense system.  Being pregnant or breastfeeding. What are the signs or symptoms?  Fluid coming from the vagina that is not normal.  A bad smell.  Itching, pain, or swelling.  Pain with sex.  Pain or burning when you pee (urinate). Sometimes there are no symptoms. How is this treated? Treatment may include:  Antibiotic creams or pills.  Antifungal medicines.  Medicines to ease symptoms if you have a virus. Your sex partner should also be treated.  Estrogen medicines.  Avoiding scented soaps, sprays, or douches.  Stopping use of products that caused irritation and then using a cream to treat symptoms. Follow these instructions at home: Lifestyle  Keep the area around your vagina clean and dry. ? Avoid using soap. ? Rinse the area with water.  Until your doctor says it is okay: ? Do not use washes for the vagina. ? Do not use tampons. ? Do not have sex.  Wipe from front to back after going to the bathroom.  When your doctor says it is okay, practice safe sex and use condoms. General  instructions  Take over-the-counter and prescription medicines only as told by your doctor.  If you were prescribed an antibiotic medicine, take or use it as told by your doctor. Do not stop taking or using it even if you start to feel better.  Keep all follow-up visits. How is this prevented?  Do not use things that can irritate the vagina, such as fabric softeners. Avoid these products if they are scented: ? Sprays. ? Detergents. ? Tampons. ? Products for cleaning the vagina. ? Soaps or bubble baths.  Let air reach your vagina. To do this: ? Wear cotton underwear. ? Do not wear:  Underwear while you sleep.  Tight pants.  Thong underwear.  Underwear or nylons without a cotton panel. ? Take off any wet clothing, such as bathing suits, as soon as you can. ? Practice safe sex and use condoms. Contact a doctor if:  You have pain in your belly or in the area between  your hips.  You have a fever or chills.  Your symptoms last for more than 2-3 days. Get help right away if:  You have a fever and your symptoms get worse all of a sudden. Summary  Vaginitis is irritation and swelling of the vagina.  Treatment will depend on the cause of the condition.  Do not use washes or tampons or have sex until your doctor says it is okay. This information is not intended to replace advice given to you by your health care provider. Make sure you discuss any questions you have with your health care provider. Document Revised: 07/20/2019 Document Reviewed: 07/20/2019 Elsevier Patient Education  2021 [place]. Bacterial Vaginosis  Bacterial vaginosis is an infection of the vagina. It happens when too many normal germs (healthy bacteria) grow in the vagina. This infection can make it easier to get other infections from sex (STIs). It is very important for pregnant women to get treated. This infection can cause babies to be born early or at a low birth weight. What are the  causes? This infection is caused by an increase in certain germs that grow in the vagina. You cannot get this infection from toilet seats, bedsheets, swimming pools, or things that touch your vagina. What increases the risk?  Having sex with a new person or more than one person.  Having sex without protection.  Douching.  Having an intrauterine device (IUD).  Smoking.  Using drugs or drinking alcohol. These can lead you to do things that are risky.  Taking certain antibiotic medicines.  Being pregnant. What are the signs or symptoms? Some women have no symptoms. Symptoms may include:  A discharge from your vagina. It may be gray or white. It can be watery or foamy.  A fishy smell. This can happen after sex or during your menstrual period.  Itching in and around your vagina.  A feeling of burning or pain when you pee (urinate). How is this treated? This infection is treated with antibiotic medicines. These may be given to you as:  A pill.  A cream for your vagina.  A medicine that you put into your vagina (suppository). If the infection comes back after treatment, you may need more antibiotics. Follow these instructions at home: Medicines  Take over-the-counter and prescription medicines as told by your doctor.  Take or use your antibiotic medicine as told by your doctor. Do not stop taking or using it, even if you start to feel better. General instructions  If the person you have sex with is a woman, tell her that you have this infection. She will need to follow up with her doctor. If you have a female partner, he does not need to be treated.  Do not have sex until you finish treatment.  Drink enough fluid to keep your pee pale yellow.  Keep your vagina and butt clean. ? Wash the area with warm water each day. ? Wipe from front to back after you use the toilet.  If you are breastfeeding a baby, ask your doctor if you should keep doing so during  treatment.  Keep all follow-up visits. How is this prevented? Self-care  Do not douche.  Use only warm water to wash around your vagina.  Wear underwear that is cotton or lined with cotton.  Do not wear tight pants and pantyhose, especially in the summer. Safe sex  Use protection when you have sex. This includes: ? Use condoms. ? Use dental dams. This is a  thin layer that protects the mouth during oral sex.  Limit how many people you have sex with. To prevent this infection, it is best to have sex with just one person.  Get tested for STIs. The person you have sex with should also get tested. Drugs and alcohol  Do not smoke or use any products that contain nicotine or tobacco. If you need help quitting, ask your doctor.  Do not use drugs.  Do not drink alcohol if: ? Your doctor tells you not to drink. ? You are pregnant, may be pregnant, or are planning to become pregnant.  If you drink alcohol: ? Limit how much you have to 0-1 drink a day. ? Know how much alcohol is in your drink. In the [place] one drink equals one 12 oz bottle of beer (355 mL), one 5 oz glass of wine (148 mL), or one 1 oz glass of hard liquor (44 mL). Where to find more information  Centers for Disease Control and Prevention: http://www.wolf.info/  American Sexual Health Association: www.ashastd.org  Office on [employer]: [place] Contact a doctor if:  Your symptoms do not get better, even after you are treated.  You have more discharge or pain when you pee.  You have a fever or chills.  You have pain in your belly (abdomen) or in the area between your hips.  You have pain with sex.  You bleed from your vagina between menstrual periods. Summary  This infection can happen when too many germs (bacteria) grow in the vagina.  This infection can make it easier to get infections from sex (STIs). Treating this can lower that chance.  Get treated if you are pregnant. This infection can  cause babies to be born early.  Do not stop taking or using your antibiotic medicine, even if you start to feel better. This information is not intended to replace advice given to you by your health care provider. Make sure you discuss any questions you have with your health care provider. Document Revised: 07/20/2019 Document Reviewed: 07/20/2019 Elsevier Patient Education  2021 [place]. Metronidazole vaginal gel What is this medicine? METRONIDAZOLE (me troe NI da zole) VAGINAL GEL is an antiinfective. It is used to treat bacterial vaginitis. This medicine may be used for other purposes; ask your health care provider or pharmacist if you have questions. COMMON BRAND NAME(S): MetroGel, MetroGel Vaginal, MetroGel-Vaginal, NUVESSA, Vandazole What should I tell my health care provider before I take this medicine? They need to know if you have any of these conditions:  Cockayne syndrome  history of blood diseases, like sickle cell disease or leukemia  history of yeast infection  if you often drink alcohol  liver disease  an unusual or allergic reaction to metronidazole, nitroimidazoles, parabens, or other medicines, foods, dyes, or preservatives  pregnant or trying to get pregnant  breast-feeding How should I use this medicine? This medicine is only for use in the vagina. Do not take by mouth or apply to other areas of the body. Follow the directions on the prescription label. Wash hands before and after use. Screw the applicator to the tube and squeeze the tube gently to fill the applicator. Lie on your back, part and bend your knees. Insert the applicator tip high in the vagina and push the plunger to release the gel into the vagina. Gently remove the applicator. Wash the applicator well with warm water and soap. Use at regular intervals. Finish the full course prescribed by your doctor or health  care professional even if you think your condition is better. Do not stop using except  on the advice of your doctor or health care professional. Talk to your pediatrician regarding the use of this medicine in children. While this drug maybe prescribed for children as young as 12 years for selected conditions, precautions do apply. Overdosage: If you think you have taken too much of this medicine contact a poison control center or emergency room at once. NOTE: This medicine is only for you. Do not share this medicine with others. What if I miss a dose? If you miss a dose, use it as soon as you can. If it is almost time for your next dose, use only that dose. Do not use double or extra doses. What may interact with this medicine? Do not take this medicine with any of the following medications:  alcohol or any product that contains alcohol  cisapride  disulfiram  dronedarone  pimozide  thioridazine This medicine may also interact with the following medications:  amiodarone  birth control pills  busulfan  carbamazepine  cimetidine  cyclosporine  fluorouracil  lithium  other medicines that prolong the QT interval (cause an abnormal heart rhythm) like dofetilide, ziprasidone  phenobarbital  phenytoin  quinidine  tacrolimus  vecuronium  warfarin This list may not describe all possible interactions. Give your health care provider a list of all the medicines, herbs, non-prescription drugs, or dietary supplements you use. Also tell them if you smoke, drink alcohol, or use illegal drugs. Some items may interact with your medicine. What should I watch for while using this medicine? Tell your doctor or health care professional if your symptoms do not improve or if they get worse. You may get drowsy or dizzy. Do not drive, use machinery, or do anything that needs mental alertness until you know how this medicine affects you. Do not stand or sit up quickly, especially if you are an older patient. This reduces the risk of dizzy or fainting spells. Ask your doctor  or health care professional if you should avoid alcohol. Many nonprescription cough and cold products contain alcohol. Metronidazole can cause an unpleasant reaction when taken with alcohol. The reaction includes flushing, headache, nausea, vomiting, sweating, and increased thirst. The reaction can last from 30 minutes to several hours. If you are being treated for a sexually transmitted disease, avoid sexual contact until you have finished your treatment. Your sexual partner may also need treatment. What side effects may I notice from receiving this medicine? Side effects that you should report to your doctor or health care professional as soon as possible:  allergic reactions like skin rash, itching or hives, swelling of the face, lips, or tongue  confusion  fast, irregular heartbeat  fever, chills, sore throat  fever with rash, swollen lymph nodes, or swelling of the face  pain, tingling, numbness in the hands or feet  redness, blistering, peeling or loosening of the skin, including inside the mouth  seizures  sign and symptoms of liver injury like dark yellow or brown urine; general ill feeling or flu-like symptoms; light colored stools; loss of appetite; nausea; right upper belly pain; unusually weak or tired; yellowing of the eyes or skin  vaginal discharge, itching, or odor in women Side effects that usually do not require medical attention (report to your doctor or health care professional if they continue or are bothersome):  changes in taste  diarrhea  headache  nausea, vomiting  stomach pain This list may not describe  all possible side effects. Call your doctor for medical advice about side effects. You may report side effects to FDA at 1-800-FDA-1088. Where should I keep my medicine? Keep out of the reach of children. Store at room temperature between 15 and 30 degrees C (59 and 86 degrees F). Do not freeze. Throw away any unused medicine after the expiration  date. NOTE: This sheet is a summary. It may not cover all possible information. If you have questions about this medicine, talk to your doctor, pharmacist, or health care provider.  2021 Elsevier/Gold Standard (2018-01-11 06:53:27) Terconazole vaginal cream What is this medicine? TERCONAZOLE (ter KON a zole) is an antifungal medicine. It is used to treat yeast infections of the vagina. This medicine may be used for other purposes; ask your health care provider or pharmacist if you have questions. COMMON BRAND NAME(S): Terazol 3, Terazol 7, Zazole What should I tell my health care provider before I take this medicine? They need to know if you have any of these conditions:  an unusual or allergic reaction to terconazole, other antifungals, other medicines, foods, dyes or preservatives  pregnant or trying to get pregnant  breast-feeding How should I use this medicine? This medicine is only for use in the vagina. Do not take by mouth. Wash hands before and after use. Read package directions carefully before using. Use this medicine at bedtime, unless otherwise directed by your doctor or health care professional. Screw the applicator onto the end of the tube and squeeze the tube to fill the applicator. Remove the applicator from the tube. Lie on your back. Gently insert the applicator tip high in the vagina and push the plunger to release the cream into the vagina. Gently remove the applicator. Wash the applicator well with warm water and soap. Use at regular intervals. Do not get this medicine in your eyes. If you do, rinse out with plenty of cool tap water. Finish the full course prescribed by your doctor or health care professional even if you think your condition is better. Do not stop using this medicine if your menstrual period starts during the time of treatment. Talk to your pediatrician regarding the use of this medicine in children. Special care may be needed. Overdosage: If you think you  have taken too much of this medicine contact a poison control center or emergency room at once. NOTE: This medicine is only for you. Do not share this medicine with others. What if I miss a dose? If you miss a dose, use it as soon as you can. If it is almost time for your next dose, use only that dose. Do not use double or extra doses. What may interact with this medicine? Interactions are not expected. Do not use any other vaginal products without telling your doctor or health care professional. This list may not describe all possible interactions. Give your health care provider a list of all the medicines, herbs, non-prescription drugs, or dietary supplements you use. Also tell them if you smoke, drink alcohol, or use illegal drugs. Some items may interact with your medicine. What should I watch for while using this medicine? Tell your doctor or health care professional if your symptoms do not start to get better within a few days. It is better not to have sex until you have finished your treatment. If you have sex, your partner should use a condom during sex to help prevent transfer of the infection. Your sexual partner may also need treatment. Vaginal medicines usually  will come out of the vagina during treatment. To keep the medicine from getting on your clothing, wear a mini-pad or sanitary napkin. The use of tampons is not recommended since they may soak up the medicine. To help clear up the infection, wear freshly washed cotton, not synthetic, underwear. What side effects may I notice from receiving this medicine? Side effects that you should report to your doctor or health care professional as soon as possible:  painful or difficult urination  vaginal pain Side effects that usually do not require medical attention (report to your doctor or health care professional if they continue or are bothersome):  headache  menstrual pain  stomach upset  vaginal irritation, itching or  burning This list may not describe all possible side effects. Call your doctor for medical advice about side effects. You may report side effects to FDA at 1-800-FDA-1088. Where should I keep my medicine? Keep out of the reach of children. Store at room temperature between 15 and 30 degrees C (59 and 86 degrees F). Throw away any unused medicine after the expiration date. NOTE: This sheet is a summary. It may not cover all possible information. If you have questions about this medicine, talk to your doctor, pharmacist, or health care provider.  2021 Elsevier/Gold Standard (2007-10-05 13:51:27)

## 2020-04-18 NOTE — Progress Notes (Signed)
Established patient visit   Patient: [name]   DOB: [birth year]   50 y.o. Female  MRN: [id number] Visit Date: 04/18/2020  Today's healthcare provider: Marcille Buffy, FNP   Chief Complaint  Patient presents with  . Dysuria   Subjective    Dysuria  This is a recurrent problem. The current episode started in the past 7 days. The problem occurs every urination. The problem has been gradually worsening. There has been no fever. Associated symptoms include hesitancy, nausea, sweats and urgency. Pertinent negatives include no chills, discharge, flank pain, frequency, hematuria, possible pregnancy or vomiting. She has tried antibiotics for the symptoms. The treatment provided no relief. Her past medical history is significant for recurrent UTIs.    One month ago was treated with E coli in urine  01/27/2020 to January 2022 she was at [facility] she had a gastrointestinal bleed and she has had 8 transfusions sent then.   Patient  denies any fever, body aches,chills, rash, chest pain, shortness of breath, nausea, vomiting, or diarrhea.  Denies dizziness, lightheadedness, pre syncopal or syncopal episodes.      Medications: Outpatient Medications Prior to Visit  Medication Sig  . AJOVY 225 MG/1.5ML SOAJ Inject into the skin.  Marland Kitchen alosetron (LOTRONEX) 1 MG tablet Take 1 tablet (1 mg total) by mouth 2 (two) times daily.  Marland Kitchen ALPRAZolam (XANAX) 1 MG tablet Take 1 tablet by mouth twice daily as needed  . amLODipine (NORVASC) 2.5 MG tablet Take 1 tablet (2.5 mg total) by mouth daily.  Marland Kitchen buPROPion (WELLBUTRIN SR) 200 MG 12 hr tablet Take 200 mg by mouth every morning.  . carvedilol (COREG) 25 MG tablet Take 1 tablet (25 mg total) by mouth 2 (two) times daily with a meal.  . desvenlafaxine (PRISTIQ) 50 MG 24 hr tablet Take 50 mg by mouth daily.  . diphenoxylate-atropine (LOMOTIL) 2.5-0.025 MG tablet Take 1 tablet by mouth 4 (four) times daily as needed for diarrhea or loose  stools.  Arna Medici 25 MCG tablet TAKE 1 TABLET BY MOUTH ONCE DAILY BEFORE BREAKFAST  . famotidine (PEPCID) 20 MG tablet Take 20 mg by mouth 2 (two) times daily.  Marland Kitchen gabapentin (NEURONTIN) 300 MG capsule Take 2 capsules (600 mg total) by mouth 2 (two) times daily.  . Galcanezumab-gnlm (EMGALITY) 120 MG/ML SOAJ Inject 120 mg into the skin every 28 (twenty-eight) days.  . isosorbide mononitrate (IMDUR) 120 MG 24 hr tablet Take 1 tablet (120 mg total) by mouth daily.  . nitroGLYCERIN (NITROSTAT) 0.4 MG SL tablet Place 1 tablet (0.4 mg total) under the tongue every 5 (five) minutes as needed for chest pain.  Vladimir Faster Glycol-Propyl Glycol (SYSTANE OP) Place 1 drop into both eyes daily as needed (dry eyes).  . promethazine (PHENERGAN) 25 MG tablet TAKE 1 TABLET BY MOUTH EVERY 6 HOURS AS NEEDED FOR NAUSEA FOR VOMITING  . rosuvastatin (CRESTOR) 40 MG tablet Take 1 tablet (40 mg total) by mouth daily at 6 PM.  . traZODone (DESYREL) 100 MG tablet TAKE 1 TO 2 TABLETS BY MOUTH AT BEDTIME  . HYDROcodone-acetaminophen (NORCO) 5-325 MG tablet Take 1 tablet by mouth every 6 (six) hours as needed for moderate pain. (Patient not taking: No sig reported)  . UBRELVY 100 MG TABS Take 100 mg by mouth 2 (two) times daily as needed (migraine).  (Patient not taking: No sig reported)   No facility-administered medications prior to visit.    Review of Systems  Constitutional: Negative  for chills.  Gastrointestinal: Positive for nausea. Negative for vomiting.  Genitourinary: Positive for dysuria, hesitancy and urgency. Negative for flank pain, frequency and hematuria.       Objective    BP 105/67   Pulse 79   Temp 98.1 F (36.7 C) (Oral)   Resp 15   Wt 145 lb 6.4 oz (66 kg)   SpO2 94%   BMI 25.76 kg/m     Physical Exam Vitals reviewed.  Constitutional:      General: She is not in acute distress.    Appearance: She is well-developed. She is not diaphoretic.     Interventions: She is not  intubated. HENT:     Head: Normocephalic and atraumatic.     Right Ear: External ear normal.     Left Ear: External ear normal.     Nose: Nose normal.     Mouth/Throat:     Pharynx: No oropharyngeal exudate.  Eyes:     General: Lids are normal. No scleral icterus.       Right eye: No discharge.        Left eye: No discharge.     Conjunctiva/sclera: Conjunctivae normal.     Right eye: Right conjunctiva is not injected. No exudate or hemorrhage.    Left eye: Left conjunctiva is not injected. No exudate or hemorrhage.    Pupils: Pupils are equal, round, and reactive to light.  Neck:     Thyroid: No thyroid mass or thyromegaly.     Vascular: Normal carotid pulses. No carotid bruit, hepatojugular reflux or JVD.     Trachea: Trachea and phonation normal. No tracheal tenderness or tracheal deviation.     Meningeal: Brudzinski's sign and Kernig's sign absent.  Cardiovascular:     Rate and Rhythm: Normal rate and regular rhythm.     Pulses: Normal pulses.          Radial pulses are 2+ on the right side and 2+ on the left side.       Dorsalis pedis pulses are 2+ on the right side and 2+ on the left side.       Posterior tibial pulses are 2+ on the right side and 2+ on the left side.     Heart sounds: Normal heart sounds, S1 normal and S2 normal. Heart sounds not distant. No murmur heard. No friction rub. No gallop.   Pulmonary:     Effort: Pulmonary effort is normal. No tachypnea, bradypnea, accessory muscle usage or respiratory distress. She is not intubated.     Breath sounds: Normal breath sounds. No stridor. No wheezing or rales.  Chest:     Chest wall: No tenderness.  Breasts:     Right: No supraclavicular adenopathy.     Left: No supraclavicular adenopathy.    Abdominal:     General: Bowel sounds are normal. There is no distension or abdominal bruit.     Palpations: Abdomen is soft. There is no shifting dullness, fluid wave, hepatomegaly, splenomegaly, mass or pulsatile mass.      Tenderness: There is no abdominal tenderness. There is no guarding or rebound.     Hernia: No hernia is present.  Musculoskeletal:        General: No tenderness or deformity. Normal range of motion.     Cervical back: Full passive range of motion without pain, normal range of motion and neck supple. No edema, erythema or rigidity. No spinous process tenderness or muscular tenderness. Normal range of motion.  Lymphadenopathy:  Head:     Right side of head: No submental, submandibular, tonsillar, preauricular, posterior auricular or occipital adenopathy.     Left side of head: No submental, submandibular, tonsillar, preauricular, posterior auricular or occipital adenopathy.     Cervical: No cervical adenopathy.     Right cervical: No superficial, deep or posterior cervical adenopathy.    Left cervical: No superficial, deep or posterior cervical adenopathy.     Upper Body:     Right upper body: No supraclavicular or pectoral adenopathy.     Left upper body: No supraclavicular or pectoral adenopathy.  Skin:    General: Skin is warm and dry.     Coloration: Skin is not pale.     Findings: No abrasion, bruising, burn, ecchymosis, erythema, lesion, petechiae or rash.     Nails: There is no clubbing.  Neurological:     Mental Status: She is alert and oriented to person, place, and time.     GCS: GCS eye subscore is 4. GCS verbal subscore is 5. GCS motor subscore is 6.     Cranial Nerves: No cranial nerve deficit.     Sensory: No sensory deficit.     Motor: No tremor, atrophy, abnormal muscle tone or seizure activity.     Coordination: Coordination normal.     Gait: Gait normal.     Deep Tendon Reflexes: Reflexes are normal and symmetric. Reflexes normal. Babinski sign absent on the right side. Babinski sign absent on the left side.     Reflex Scores:      Tricep reflexes are 2+ on the right side and 2+ on the left side.      Bicep reflexes are 2+ on the right side and 2+ on the left  side.      Brachioradialis reflexes are 2+ on the right side and 2+ on the left side.      Patellar reflexes are 2+ on the right side and 2+ on the left side.      Achilles reflexes are 2+ on the right side and 2+ on the left side. Psychiatric:        Speech: Speech normal.        Behavior: Behavior normal.        Thought Content: Thought content normal.        Judgment: Judgment normal.     Results for orders placed or performed in visit on 04/18/20  Urine Culture   Specimen: Urine   Urine  Result Value Ref Range   Urine Culture, Routine Preliminary report (A)    Organism ID, Bacteria Gram negative rods (A)   CBC with Differential/Platelet  Result Value Ref Range   WBC 12.4 (H) 3.4 - 10.8 x10E3/uL   RBC 4.33 3.77 - 5.28 x10E6/uL   Hemoglobin 11.0 (L) 11.1 - 15.9 g/dL   Hematocrit 35.4 34.0 - 46.6 %   MCV 82 79 - 97 fL   MCH 25.4 (L) 26.6 - 33.0 pg   MCHC 31.1 (L) 31.5 - 35.7 g/dL   RDW 13.8 11.7 - 15.4 %   Platelets 259 150 - 450 x10E3/uL   Neutrophils 77 Not Estab. %   Lymphs 15 Not Estab. %   Monocytes 6 Not Estab. %   Eos 1 Not Estab. %   Basos 1 Not Estab. %   Neutrophils Absolute 9.4 (H) 1.4 - 7.0 x10E3/uL   Lymphocytes Absolute 1.9 0.7 - 3.1 x10E3/uL   Monocytes Absolute 0.7 0.1 - 0.9 x10E3/uL   EOS (  ABSOLUTE) 0.2 0.0 - 0.4 x10E3/uL   Basophils Absolute 0.1 0.0 - 0.2 x10E3/uL   Immature Granulocytes 0 Not Estab. %   Immature Grans (Abs) 0.0 0.0 - 0.1 x10E3/uL  Comprehensive Metabolic Panel (CMET)  Result Value Ref Range   Glucose 95 65 - 99 mg/dL   BUN 20 6 - 24 mg/dL   Creatinine, Ser 1.79 (H) 0.57 - 1.00 mg/dL   eGFR 34 (L) >59 mL/min/1.73   BUN/Creatinine Ratio 11 9 - 23   Sodium 139 134 - 144 mmol/L   Potassium 4.6 3.5 - 5.2 mmol/L   Chloride 103 96 - 106 mmol/L   CO2 18 (L) 20 - 29 mmol/L   Calcium 8.4 (L) 8.7 - 10.2 mg/dL   Total Protein 5.8 (L) 6.0 - 8.5 g/dL   Albumin 3.2 (L) 3.8 - 4.9 g/dL   Globulin, Total 2.6 1.5 - 4.5 g/dL   Albumin/Globulin  Ratio 1.2 1.2 - 2.2   Bilirubin Total 0.2 0.0 - 1.2 mg/dL   Alkaline Phosphatase 171 (H) 44 - 121 IU/L   AST 19 0 - 40 IU/L   ALT 16 0 - 32 IU/L  TSH  Result Value Ref Range   TSH 1.410 0.450 - 4.500 uIU/mL  Hepatitis panel, acute  Result Value Ref Range   Hep A IgM Negative Negative   Hepatitis B Surface Ag Negative Negative   Hep B C IgM Negative Negative   Hep C Virus Ab <0.1 0.0 - 0.9 s/co ratio  POCT urinalysis dipstick  Result Value Ref Range   Color, UA yellowish    Clarity, UA very cloudy    Glucose, UA Negative Negative   Bilirubin, UA negative    Ketones, UA negative    Spec Grav, UA 1.010 1.010 - 1.025   Blood, UA large    pH, UA 5.0 5.0 - 8.0   Protein, UA Positive (A) Negative   Urobilinogen, UA 0.2 0.2 or 1.0 E.U./dL   Nitrite, UA negative    Leukocytes, UA Large (3+) (A) Negative   Appearance     Odor      Assessment & Plan     1. Dysuria - Urine Culture - POCT urinalysis dipstick - CBC with Differential/Platelet - Comprehensive Metabolic Panel (CMET) - doxycycline (VIBRA-TABS) 100 MG tablet; Take 1 tablet (100 mg total) by mouth 2 (two) times daily.  Dispense: 20 tablet; Refill: 0  2. Fatigue, unspecified type - CBC with Differential/Platelet - Comprehensive Metabolic Panel (CMET) - TSH - Hepatitis panel, acute - Hepatitis C Antibody  3. Acute vaginitis - terconazole (TERAZOL 7) 0.4 % vaginal cream; Place 1 applicator vaginally at bedtime for 5 days. Use this the first week.  Dispense: 45 g; Refill: 0 - metroNIDAZOLE (METROGEL VAGINAL) 0.75 % vaginal gel; Place 1 Applicatorful vaginally daily for 5 days. Use this the second week.  Dispense: 50 g; Refill: 0  4. Urinary tract infection without hematuria, site unspecified   Orders Placed This Encounter  Procedures  . Urine Culture  . CBC with Differential/Platelet  . Comprehensive Metabolic Panel (CMET)  . TSH  . Hepatitis panel, acute  . Hepatitis C Antibody  . POCT urinalysis dipstick    Meds ordered this encounter  Medications  . terconazole (TERAZOL 7) 0.4 % vaginal cream    Sig: Place 1 applicator vaginally at bedtime for 5 days. Use this the first week.    Dispense:  45 g    Refill:  0  . metroNIDAZOLE (METROGEL VAGINAL) 0.75 %  vaginal gel    Sig: Place 1 Applicatorful vaginally daily for 5 days. Use this the second week.    Dispense:  50 g    Refill:  0  . doxycycline (VIBRA-TABS) 100 MG tablet    Sig: Take 1 tablet (100 mg total) by mouth 2 (two) times daily.    Dispense:  20 tablet    Refill:  0   Red Flags discussed. The patient was given clear instructions to go to ER or return to medical center if any red flags develop, symptoms do not improve, worsen or new problems develop. They verbalized understanding.   Return if symptoms worsen or fail to improve, for at any time for any worsening symptoms, Go to Emergency room/ urgent care if worse.      The entirety of the information documented in the History of Present Illness, Review of Systems and Physical Exam were personally obtained by me. Portions of this information were initially documented by the CMA and reviewed by me for thoroughness and accuracy.      Marcille Buffy, [place] [phone] (phone) [phone] (fax)  [place]

## 2020-04-19 LAB — COMPREHENSIVE METABOLIC PANEL
ALT: 16 IU/L (ref 0–32)
AST: 19 IU/L (ref 0–40)
Albumin/Globulin Ratio: 1.2 (ref 1.2–2.2)
Albumin: 3.2 g/dL — ABNORMAL LOW (ref 3.8–4.9)
Alkaline Phosphatase: 171 IU/L — ABNORMAL HIGH (ref 44–121)
BUN/Creatinine Ratio: 11 (ref 9–23)
BUN: 20 mg/dL (ref 6–24)
Bilirubin Total: 0.2 mg/dL (ref 0.0–1.2)
CO2: 18 mmol/L — ABNORMAL LOW (ref 20–29)
Calcium: 8.4 mg/dL — ABNORMAL LOW (ref 8.7–10.2)
Chloride: 103 mmol/L (ref 96–106)
Creatinine, Ser: 1.79 mg/dL — ABNORMAL HIGH (ref 0.57–1.00)
Globulin, Total: 2.6 g/dL (ref 1.5–4.5)
Glucose: 95 mg/dL (ref 65–99)
Potassium: 4.6 mmol/L (ref 3.5–5.2)
Sodium: 139 mmol/L (ref 134–144)
Total Protein: 5.8 g/dL — ABNORMAL LOW (ref 6.0–8.5)
eGFR: 34 mL/min/{1.73_m2} — ABNORMAL LOW (ref >59)

## 2020-04-19 LAB — HEPATITIS PANEL, ACUTE
Hep A IgM: NEGATIVE
Hep B C IgM: NEGATIVE
Hep C Virus Ab: <0.1 s/co ratio (ref 0.0–0.9)
Hepatitis B Surface Ag: NEGATIVE

## 2020-04-19 LAB — TSH: TSH: 1.41 u[IU]/mL (ref 0.450–4.500)

## 2020-04-19 LAB — CBC WITH DIFFERENTIAL/PLATELET
Basophils Absolute: 0.1 10*3/uL (ref 0.0–0.2)
Basos: 1 %
EOS (ABSOLUTE): 0.2 10*3/uL (ref 0.0–0.4)
Eos: 1 %
Hematocrit: 35.4 % (ref 34.0–46.6)
Hemoglobin: 11 g/dL — ABNORMAL LOW (ref 11.1–15.9)
Immature Grans (Abs): 0 10*3/uL (ref 0.0–0.1)
Immature Granulocytes: 0 %
Lymphocytes Absolute: 1.9 10*3/uL (ref 0.7–3.1)
Lymphs: 15 %
MCH: 25.4 pg — ABNORMAL LOW (ref 26.6–33.0)
MCHC: 31.1 g/dL — ABNORMAL LOW (ref 31.5–35.7)
MCV: 82 fL (ref 79–97)
Monocytes Absolute: 0.7 10*3/uL (ref 0.1–0.9)
Monocytes: 6 %
Neutrophils Absolute: 9.4 10*3/uL — ABNORMAL HIGH (ref 1.4–7.0)
Neutrophils: 77 %
Platelets: 259 10*3/uL (ref 150–450)
RBC: 4.33 x10E6/uL (ref 3.77–5.28)
RDW: 13.8 % (ref 11.7–15.4)
WBC: 12.4 10*3/uL — ABNORMAL HIGH (ref 3.4–10.8)

## 2020-04-19 NOTE — Progress Notes (Signed)
Wbc is elevated and neutrophils indications of likely bacterial infection she should take antibiotic for UTI as directed. Recheck CBC in 1-2 weeks.  Hemoglobin still low but improved from one month ago. Keep hematology follow up's.  CMP shows decreased / impaired but improved kidney function from one month ago. Keep nephrologist follow up.  Alkaline phos is elevated, add on GGT lab.  TSH for thyroid within normal limits. Acute hepatitis panel negative.   If any worsening symptoms or signs of kidney infection go to ED immediately is advised.   Urine culture still pending we will call if antibiotic change needed after culture results.

## 2020-04-23 ENCOUNTER — Inpatient Hospital Stay (HOSPITAL_COMMUNITY)
Admit: 2020-04-23 | Discharge: 2020-04-23 | Disposition: A | Discharge status: Home / Self Care | Payer: Medicare Other | Attending: Internal Medicine | Admitting: Internal Medicine

## 2020-04-23 ENCOUNTER — Inpatient Hospital Stay
Admission: EM | Admit: 2020-04-23 | Discharge: 2020-04-25 | DRG: 872 | Disposition: A | Discharge status: Home / Self Care | Payer: Medicare Other | Attending: Internal Medicine | Admitting: Internal Medicine

## 2020-04-23 ENCOUNTER — Encounter: Payer: Self-pay | Admitting: Adult Health

## 2020-04-23 ENCOUNTER — Other Ambulatory Visit: Payer: Self-pay

## 2020-04-23 ENCOUNTER — Emergency Department: Payer: Self-pay

## 2020-04-23 DIAGNOSIS — N1832 Chronic kidney disease, stage 3b: Secondary | ICD-10-CM | POA: Diagnosis present

## 2020-04-23 DIAGNOSIS — A419 Sepsis, unspecified organism: Secondary | ICD-10-CM | POA: Diagnosis present

## 2020-04-23 DIAGNOSIS — E875 Hyperkalemia: Secondary | ICD-10-CM | POA: Diagnosis present

## 2020-04-23 DIAGNOSIS — Z7902 Long term (current) use of antithrombotics/antiplatelets: Secondary | ICD-10-CM | POA: Diagnosis not present

## 2020-04-23 DIAGNOSIS — Z9884 Bariatric surgery status: Secondary | ICD-10-CM

## 2020-04-23 DIAGNOSIS — M549 Dorsalgia, unspecified: Secondary | ICD-10-CM | POA: Diagnosis present

## 2020-04-23 DIAGNOSIS — B9620 Unspecified Escherichia coli [E. coli] as the cause of diseases classified elsewhere: Secondary | ICD-10-CM | POA: Diagnosis present

## 2020-04-23 DIAGNOSIS — I129 Hypertensive chronic kidney disease with stage 1 through stage 4 chronic kidney disease, or unspecified chronic kidney disease: Secondary | ICD-10-CM | POA: Diagnosis present

## 2020-04-23 DIAGNOSIS — K529 Noninfective gastroenteritis and colitis, unspecified: Secondary | ICD-10-CM | POA: Diagnosis present

## 2020-04-23 DIAGNOSIS — I6930 Unspecified sequelae of cerebral infarction: Secondary | ICD-10-CM | POA: Diagnosis not present

## 2020-04-23 DIAGNOSIS — Z885 Allergy status to narcotic agent status: Secondary | ICD-10-CM

## 2020-04-23 DIAGNOSIS — Z8041 Family history of malignant neoplasm of ovary: Secondary | ICD-10-CM

## 2020-04-23 DIAGNOSIS — R809 Proteinuria, unspecified: Secondary | ICD-10-CM

## 2020-04-23 DIAGNOSIS — I252 Old myocardial infarction: Secondary | ICD-10-CM

## 2020-04-23 DIAGNOSIS — E039 Hypothyroidism, unspecified: Secondary | ICD-10-CM | POA: Diagnosis present

## 2020-04-23 DIAGNOSIS — I4891 Unspecified atrial fibrillation: Secondary | ICD-10-CM

## 2020-04-23 DIAGNOSIS — Z8711 Personal history of peptic ulcer disease: Secondary | ICD-10-CM

## 2020-04-23 DIAGNOSIS — N760 Acute vaginitis: Secondary | ICD-10-CM

## 2020-04-23 DIAGNOSIS — F419 Anxiety disorder, unspecified: Secondary | ICD-10-CM | POA: Diagnosis present

## 2020-04-23 DIAGNOSIS — R319 Hematuria, unspecified: Secondary | ICD-10-CM | POA: Diagnosis present

## 2020-04-23 DIAGNOSIS — Z955 Presence of coronary angioplasty implant and graft: Secondary | ICD-10-CM

## 2020-04-23 DIAGNOSIS — Z800 Family history of malignant neoplasm of digestive organs: Secondary | ICD-10-CM

## 2020-04-23 DIAGNOSIS — Z85820 Personal history of malignant melanoma of skin: Secondary | ICD-10-CM | POA: Diagnosis not present

## 2020-04-23 DIAGNOSIS — I2510 Atherosclerotic heart disease of native coronary artery without angina pectoris: Secondary | ICD-10-CM | POA: Diagnosis present

## 2020-04-23 DIAGNOSIS — F321 Major depressive disorder, single episode, moderate: Secondary | ICD-10-CM | POA: Diagnosis present

## 2020-04-23 DIAGNOSIS — A4151 Sepsis due to Escherichia coli [E. coli]: Principal | ICD-10-CM | POA: Diagnosis present

## 2020-04-23 DIAGNOSIS — R6520 Severe sepsis without septic shock: Secondary | ICD-10-CM | POA: Diagnosis present

## 2020-04-23 DIAGNOSIS — Z1612 Extended spectrum beta lactamase (ESBL) resistance: Secondary | ICD-10-CM | POA: Diagnosis present

## 2020-04-23 DIAGNOSIS — I25119 Atherosclerotic heart disease of native coronary artery with unspecified angina pectoris: Secondary | ICD-10-CM | POA: Diagnosis present

## 2020-04-23 DIAGNOSIS — I25118 Atherosclerotic heart disease of native coronary artery with other forms of angina pectoris: Secondary | ICD-10-CM | POA: Diagnosis not present

## 2020-04-23 DIAGNOSIS — E1122 Type 2 diabetes mellitus with diabetic chronic kidney disease: Secondary | ICD-10-CM | POA: Diagnosis present

## 2020-04-23 DIAGNOSIS — Z823 Family history of stroke: Secondary | ICD-10-CM

## 2020-04-23 DIAGNOSIS — E11649 Type 2 diabetes mellitus with hypoglycemia without coma: Secondary | ICD-10-CM | POA: Diagnosis not present

## 2020-04-23 DIAGNOSIS — E1129 Type 2 diabetes mellitus with other diabetic kidney complication: Secondary | ICD-10-CM

## 2020-04-23 DIAGNOSIS — Z8719 Personal history of other diseases of the digestive system: Secondary | ICD-10-CM

## 2020-04-23 DIAGNOSIS — Z841 Family history of disorders of kidney and ureter: Secondary | ICD-10-CM

## 2020-04-23 DIAGNOSIS — Z87440 Personal history of urinary (tract) infections: Secondary | ICD-10-CM

## 2020-04-23 DIAGNOSIS — E119 Type 2 diabetes mellitus without complications: Secondary | ICD-10-CM

## 2020-04-23 DIAGNOSIS — Z87891 Personal history of nicotine dependence: Secondary | ICD-10-CM

## 2020-04-23 DIAGNOSIS — Z794 Long term (current) use of insulin: Secondary | ICD-10-CM | POA: Diagnosis not present

## 2020-04-23 DIAGNOSIS — N390 Urinary tract infection, site not specified: Secondary | ICD-10-CM

## 2020-04-23 DIAGNOSIS — Z833 Family history of diabetes mellitus: Secondary | ICD-10-CM

## 2020-04-23 DIAGNOSIS — Z803 Family history of malignant neoplasm of breast: Secondary | ICD-10-CM

## 2020-04-23 DIAGNOSIS — Z1624 Resistance to multiple antibiotics: Secondary | ICD-10-CM | POA: Diagnosis present

## 2020-04-23 DIAGNOSIS — Z8051 Family history of malignant neoplasm of kidney: Secondary | ICD-10-CM

## 2020-04-23 DIAGNOSIS — Z20822 Contact with and (suspected) exposure to covid-19: Secondary | ICD-10-CM | POA: Diagnosis present

## 2020-04-23 DIAGNOSIS — Z8249 Family history of ischemic heart disease and other diseases of the circulatory system: Secondary | ICD-10-CM

## 2020-04-23 DIAGNOSIS — Z8349 Family history of other endocrine, nutritional and metabolic diseases: Secondary | ICD-10-CM

## 2020-04-23 DIAGNOSIS — Z808 Family history of malignant neoplasm of other organs or systems: Secondary | ICD-10-CM

## 2020-04-23 HISTORY — DX: Sepsis, unspecified organism: A41.9

## 2020-04-23 HISTORY — DX: Urinary tract infection, site not specified: N39.0

## 2020-04-23 HISTORY — DX: Unspecified atrial fibrillation: I48.91

## 2020-04-23 LAB — CBC WITH DIFFERENTIAL/PLATELET
Abs Immature Granulocytes: 0.03 10*3/uL (ref 0.00–0.07)
Basophils Absolute: 0.1 10*3/uL (ref 0.0–0.1)
Basophils Relative: 1 %
Eosinophils Absolute: 0.1 10*3/uL (ref 0.0–0.5)
Eosinophils Relative: 1 %
HCT: 37 % (ref 36.0–46.0)
Hemoglobin: 11.4 g/dL — ABNORMAL LOW (ref 12.0–15.0)
Immature Granulocytes: 0 %
Lymphocytes Relative: 19 %
Lymphs Abs: 1.7 10*3/uL (ref 0.7–4.0)
MCH: 25.1 pg — ABNORMAL LOW (ref 26.0–34.0)
MCHC: 30.8 g/dL (ref 30.0–36.0)
MCV: 81.3 fL (ref 80.0–100.0)
Monocytes Absolute: 0.4 10*3/uL (ref 0.1–1.0)
Monocytes Relative: 5 %
Neutro Abs: 6.7 10*3/uL (ref 1.7–7.7)
Neutrophils Relative %: 74 %
Platelets: 286 10*3/uL (ref 150–400)
RBC: 4.55 MIL/uL (ref 3.87–5.11)
RDW: 14.9 % (ref 11.5–15.5)
WBC: 9.1 10*3/uL (ref 4.0–10.5)
nRBC: 0 % (ref 0.0–0.2)

## 2020-04-23 LAB — URINE CULTURE

## 2020-04-23 LAB — URINALYSIS, COMPLETE (UACMP) WITH MICROSCOPIC
Bilirubin Urine: NEGATIVE
Glucose, UA: NEGATIVE mg/dL
Hgb urine dipstick: NEGATIVE
Ketones, ur: NEGATIVE mg/dL
Nitrite: NEGATIVE
Protein, ur: NEGATIVE mg/dL
Specific Gravity, Urine: 1.012 (ref 1.005–1.030)
WBC, UA: >50 WBC/hpf — ABNORMAL HIGH (ref 0–5)
pH: 5 (ref 5.0–8.0)

## 2020-04-23 LAB — COMPREHENSIVE METABOLIC PANEL
ALT: 23 U/L (ref 0–44)
AST: 23 U/L (ref 15–41)
Albumin: 2.9 g/dL — ABNORMAL LOW (ref 3.5–5.0)
Alkaline Phosphatase: 142 U/L — ABNORMAL HIGH (ref 38–126)
Anion gap: 8 (ref 5–15)
BUN: 26 mg/dL — ABNORMAL HIGH (ref 6–20)
CO2: 22 mmol/L (ref 22–32)
Calcium: 8.5 mg/dL — ABNORMAL LOW (ref 8.9–10.3)
Chloride: 107 mmol/L (ref 98–111)
Creatinine, Ser: 1.71 mg/dL — ABNORMAL HIGH (ref 0.44–1.00)
GFR, Estimated: 36 mL/min — ABNORMAL LOW (ref >60)
Glucose, Bld: 178 mg/dL — ABNORMAL HIGH (ref 70–99)
Potassium: 6.1 mmol/L — ABNORMAL HIGH (ref 3.5–5.1)
Sodium: 137 mmol/L (ref 135–145)
Total Bilirubin: 0.5 mg/dL (ref 0.3–1.2)
Total Protein: 6.1 g/dL — ABNORMAL LOW (ref 6.5–8.1)

## 2020-04-23 LAB — HEMOGLOBIN A1C
Hgb A1c MFr Bld: 5.2 % (ref 4.8–5.6)
Mean Plasma Glucose: 102.54 mg/dL

## 2020-04-23 LAB — BASIC METABOLIC PANEL
Anion gap: 5 (ref 5–15)
BUN: 24 mg/dL — ABNORMAL HIGH (ref 6–20)
CO2: 21 mmol/L — ABNORMAL LOW (ref 22–32)
Calcium: 7.6 mg/dL — ABNORMAL LOW (ref 8.9–10.3)
Chloride: 112 mmol/L — ABNORMAL HIGH (ref 98–111)
Creatinine, Ser: 1.67 mg/dL — ABNORMAL HIGH (ref 0.44–1.00)
GFR, Estimated: 37 mL/min — ABNORMAL LOW (ref >60)
Glucose, Bld: 56 mg/dL — ABNORMAL LOW (ref 70–99)
Potassium: 5.8 mmol/L — ABNORMAL HIGH (ref 3.5–5.1)
Sodium: 138 mmol/L (ref 135–145)

## 2020-04-23 LAB — RESP PANEL BY RT-PCR (FLU A&B, COVID) ARPGX2
Influenza A by PCR: NEGATIVE
Influenza B by PCR: NEGATIVE
SARS Coronavirus 2 by RT PCR: NEGATIVE

## 2020-04-23 LAB — PROTIME-INR
INR: 1.2 (ref 0.8–1.2)
Prothrombin Time: 14.2 seconds (ref 11.4–15.2)

## 2020-04-23 LAB — GLUCOSE, CAPILLARY
Glucose-Capillary: 40 mg/dL — CL (ref 70–99)
Glucose-Capillary: 143 mg/dL — ABNORMAL HIGH (ref 70–99)

## 2020-04-23 LAB — TSH: TSH: 1.6 u[IU]/mL (ref 0.350–4.500)

## 2020-04-23 LAB — LACTIC ACID, PLASMA
Lactic Acid, Venous: 3.9 mmol/L (ref 0.5–1.9)
Lactic Acid, Venous: 3.4 mmol/L (ref 0.5–1.9)
Lactic Acid, Venous: 3.3 mmol/L (ref 0.5–1.9)
Lactic Acid, Venous: 2.9 mmol/L (ref 0.5–1.9)

## 2020-04-23 MED ORDER — POLYETHYL GLYCOL-PROPYL GLYCOL 0.4-0.3 % OP GEL: Freq: Every day | OPHTHALMIC | Status: DC | PRN

## 2020-04-23 MED ORDER — TRAZODONE HCL 100 MG PO TABS
100.0000 mg | ORAL_TABLET | Freq: Every day | ORAL | Status: DC
  Administered 2020-04-23 – 2020-04-24 (×2): 100 mg via ORAL
  Filled 2020-04-23 (×2): qty 1

## 2020-04-23 MED ORDER — KETOROLAC TROMETHAMINE 30 MG/ML IJ SOLN
15.0000 mg | Freq: Once | INTRAMUSCULAR | Status: AC
  Administered 2020-04-23: 15 mg via INTRAVENOUS
  Filled 2020-04-23: qty 1

## 2020-04-23 MED ORDER — ACETAMINOPHEN 650 MG RE SUPP: 650.0000 mg | Freq: Four times a day (QID) | RECTAL | Status: DC | PRN

## 2020-04-23 MED ORDER — METRONIDAZOLE 0.75 % VA GEL
1.0000 | Freq: Every day | VAGINAL | Status: DC
  Filled 2020-04-23: qty 70

## 2020-04-23 MED ORDER — SODIUM CHLORIDE 0.9 % IV SOLN: 1.0000 g | Freq: Two times a day (BID) | INTRAVENOUS | Status: DC

## 2020-04-23 MED ORDER — LEVOTHYROXINE SODIUM 25 MCG PO TABS
25.0000 ug | ORAL_TABLET | Freq: Every day | ORAL | Status: DC
  Administered 2020-04-24 – 2020-04-25 (×2): 25 ug via ORAL
  Filled 2020-04-23 (×2): qty 1

## 2020-04-23 MED ORDER — VENLAFAXINE HCL ER 75 MG PO CP24
75.0000 mg | ORAL_CAPSULE | Freq: Every day | ORAL | Status: DC
  Administered 2020-04-24 – 2020-04-25 (×2): 75 mg via ORAL
  Filled 2020-04-23 (×3): qty 1

## 2020-04-23 MED ORDER — ALPRAZOLAM 0.5 MG PO TABS
2.0000 mg | ORAL_TABLET | Freq: Every day | ORAL | Status: DC
  Administered 2020-04-23 – 2020-04-24 (×2): 2 mg via ORAL
  Filled 2020-04-23 (×2): qty 4

## 2020-04-23 MED ORDER — ROSUVASTATIN CALCIUM 10 MG PO TABS
40.0000 mg | ORAL_TABLET | Freq: Every day | ORAL | Status: DC
Start: 2020-04-23 — End: 2020-04-25
  Administered 2020-04-24: 40 mg via ORAL
  Filled 2020-04-23: qty 4
  Filled 2020-04-23: qty 2

## 2020-04-23 MED ORDER — ACETAMINOPHEN 325 MG PO TABS
650.0000 mg | ORAL_TABLET | Freq: Four times a day (QID) | ORAL | Status: DC | PRN
  Filled 2020-04-23: qty 2

## 2020-04-23 MED ORDER — CLOPIDOGREL BISULFATE 75 MG PO TABS
75.0000 mg | ORAL_TABLET | Freq: Every day | ORAL | Status: DC
  Administered 2020-04-24 – 2020-04-25 (×2): 75 mg via ORAL
  Filled 2020-04-23 (×2): qty 1

## 2020-04-23 MED ORDER — SODIUM CHLORIDE 0.9 % IV SOLN: 2.0000 g | Freq: Once | INTRAVENOUS | Status: DC

## 2020-04-23 MED ORDER — GABAPENTIN 300 MG PO CAPS
600.0000 mg | ORAL_CAPSULE | Freq: Two times a day (BID) | ORAL | Status: DC
  Administered 2020-04-23 – 2020-04-25 (×4): 600 mg via ORAL
  Filled 2020-04-23 (×4): qty 2

## 2020-04-23 MED ORDER — DEXTROSE 50 % IV SOLN
1.0000 | Freq: Once | INTRAVENOUS | Status: AC
  Administered 2020-04-23: 50 mL via INTRAVENOUS
  Filled 2020-04-23: qty 50

## 2020-04-23 MED ORDER — ENOXAPARIN SODIUM 40 MG/0.4ML ~~LOC~~ SOLN
40.0000 mg | SUBCUTANEOUS | Status: DC
  Administered 2020-04-23 – 2020-04-24 (×2): 40 mg via SUBCUTANEOUS
  Filled 2020-04-23 (×2): qty 0.4

## 2020-04-23 MED ORDER — POLYVINYL ALCOHOL 1.4 % OP SOLN
1.0000 [drp] | OPHTHALMIC | Status: DC | PRN
  Filled 2020-04-23: qty 15

## 2020-04-23 MED ORDER — NITROGLYCERIN 0.4 MG SL SUBL: 0.4000 mg | SUBLINGUAL_TABLET | SUBLINGUAL | Status: DC | PRN

## 2020-04-23 MED ORDER — INSULIN ASPART 100 UNIT/ML IV SOLN
10.0000 [IU] | Freq: Once | INTRAVENOUS | Status: AC
  Administered 2020-04-23: 5 [IU] via INTRAVENOUS
  Filled 2020-04-23 (×2): qty 0.1

## 2020-04-23 MED ORDER — FAMOTIDINE 20 MG PO TABS
20.0000 mg | ORAL_TABLET | Freq: Two times a day (BID) | ORAL | Status: DC
  Administered 2020-04-23 – 2020-04-24 (×2): 20 mg via ORAL
  Filled 2020-04-23 (×2): qty 1

## 2020-04-23 MED ORDER — SODIUM ZIRCONIUM CYCLOSILICATE 10 G PO PACK
10.0000 g | PACK | Freq: Once | ORAL | Status: AC
  Administered 2020-04-23: 10 g via ORAL
  Filled 2020-04-23: qty 1

## 2020-04-23 MED ORDER — HYDROCODONE-ACETAMINOPHEN 5-325 MG PO TABS
1.0000 | ORAL_TABLET | Freq: Four times a day (QID) | ORAL | Status: DC | PRN
  Administered 2020-04-24: 1 via ORAL
  Filled 2020-04-23 (×2): qty 1

## 2020-04-23 MED ORDER — ONDANSETRON HCL 4 MG/2ML IJ SOLN
4.0000 mg | Freq: Four times a day (QID) | INTRAMUSCULAR | Status: DC | PRN
  Administered 2020-04-23 – 2020-04-24 (×2): 4 mg via INTRAVENOUS
  Filled 2020-04-23 (×2): qty 2

## 2020-04-23 MED ORDER — BUPROPION HCL ER (SR) 100 MG PO TB12
200.0000 mg | ORAL_TABLET | Freq: Every morning | ORAL | Status: DC
  Administered 2020-04-24 – 2020-04-25 (×2): 200 mg via ORAL
  Filled 2020-04-23 (×2): qty 2

## 2020-04-23 MED ORDER — INSULIN ASPART 100 UNIT/ML ~~LOC~~ SOLN: 0.0000 [IU] | Freq: Three times a day (TID) | SUBCUTANEOUS | Status: DC

## 2020-04-23 MED ORDER — MORPHINE SULFATE (PF) 2 MG/ML IV SOLN: 2.0000 mg | INTRAVENOUS | Status: DC | PRN

## 2020-04-23 MED ORDER — SODIUM CHLORIDE 0.9 % IV BOLUS (SEPSIS)
1000.0000 mL | Freq: Once | INTRAVENOUS | Status: AC
  Administered 2020-04-23: 1000 mL via INTRAVENOUS

## 2020-04-23 MED ORDER — SODIUM CHLORIDE 0.9 % IV SOLN
1.0000 g | Freq: Two times a day (BID) | INTRAVENOUS | Status: DC
  Administered 2020-04-23 – 2020-04-25 (×4): 1 g via INTRAVENOUS
  Filled 2020-04-23 (×5): qty 1

## 2020-04-23 MED ORDER — ONDANSETRON HCL 4 MG PO TABS: 4.0000 mg | ORAL_TABLET | Freq: Four times a day (QID) | ORAL | Status: DC | PRN

## 2020-04-23 MED ORDER — SODIUM CHLORIDE 0.9 % IV SOLN
INTRAVENOUS | Status: DC | PRN
  Administered 2020-04-23 – 2020-04-24 (×2): 250 mL via INTRAVENOUS

## 2020-04-23 MED ORDER — PIPERACILLIN-TAZOBACTAM 3.375 G IVPB 30 MIN
3.3750 g | Freq: Once | INTRAVENOUS | Status: AC
  Administered 2020-04-23: 3.375 g via INTRAVENOUS
  Filled 2020-04-23: qty 50

## 2020-04-23 MED ORDER — LACTATED RINGERS IV SOLN: INTRAVENOUS | Status: DC

## 2020-04-23 NOTE — ED Notes (Signed)
Messaged Admitting MD Agbata regarding new D50 and insulin orders. Per MD Agbata, wait for BMP to result before administering.

## 2020-04-23 NOTE — ED Triage Notes (Signed)
Pt arrives to ER c/o of continued UTI. Went to PCP on 3/17 and was started on doxycycline. A&O, in wheelchair. Hypotensive in triage.

## 2020-04-23 NOTE — ED Notes (Signed)
Called [facility] per Charge RN Heather to contact IV team regarding PICC placement

## 2020-04-23 NOTE — ED Notes (Signed)
MD Cheri Fowler made aware of pt critical Lactic of 3.9 at this time.

## 2020-04-23 NOTE — Sepsis Progress Note (Signed)
eLink is tracking the Code Sepsis.  

## 2020-04-23 NOTE — ED Notes (Signed)
IV team at bedside for PIV placement

## 2020-04-23 NOTE — H&P (Signed)
History and Physical    [name] MRN:[id number] DOB: 08/25/1970 DOA: 04/23/2020  PCP: Fisher, Donald E, MD   Patient coming from: Home  I have personally briefly reviewed patient's old medical records in [place] [facility]  Chief Complaint: Abdominal pain/back pain  HPI: [name] is a 49 y.o. female with medical history significant for recurrent UTI, chronic kidney disease stage III, iron deficiency anemia, coronary artery disease, hypertension, history of CVA who presents to the ER for evaluation of abdominal pain mostly suprapubic area associated with dysuria and frequency of urination.  She rated her suprapubic pain an 8 x 10 in intensity at its worst with radiation to the back.  Patient also complains of intermittent fever with T-max of 102 F.  She has also had chills and complains of generalized weakness. Patient states that she has been having symptoms for about 5 days and was seen by her primary care provider who prescribed doxycycline.  She has been taking doxycycline without any improvement in symptoms. She denies having any diarrhea, no constipation, no chest pain, no shortness of breath, no dizziness, no lightheadedness, no palpitations, no diaphoresis, no headache, no blurred vision, no cough. In triage patient was noted to be hypotensive with blood pressure 84/57. Sodium 137, potassium 6.1, chloride 107, bicarb 22, glucose 178, creatinine 1.71, calcium 8.5, alkaline phosphatase 142, albumin 2.9, AST 23, ALT 23, total protein 6.1, total bilirubin 0.5, lactic acid 3.9 >> 3.4, white count 9.1, hemoglobin 11.4, hematocrit 37, MCV 81, RDW 14.9, platelet count 286, PT 14.2, INR 1.7 Respiratory viral panel is negative 12 EKG reviewed by me shows atrial fibrillation.  ED Course: Patient is a 49-year-old [name] with multiple medical problems who presents to the emergency room for evaluation of urinary frequency, dysuria and suprapubic pain.  Patient was  hypotensive upon arrival to the ER with systolic blood pressure in the 80s and received sepsis IV fluid bolus with improvement in her blood pressure.  Urine culture from 04/18/2020 yields multidrug-resistant E. coli.  Patient was on doxycycline at home without any improvement in his symptoms.  Lactic acid was elevated at 3.9% which shows a downward trend.  Patient will be admitted to the hospital for sepsis from urinary source.   Review of Systems: As per HPI otherwise all other systems reviewed and negative.    Past Medical History:  Diagnosis Date  . Acute colitis 01/27/2017  . Acute pyelonephritis   . Anemia    iron deficiency anemia  . Aortic arch aneurysm (HCC)   . BRCA negative 2014  . CAD (coronary artery disease)    a. 08/2003 Cath: LAD 30-40-med Rx; b. 11/2014 PCI: LAD 95m (3.25x23 Xience Alpine DES); c. 06/2015 PCI: D1 (2.25x12 Resolute Integrity DES); d. 06/2017 PCI: Patent mLAD stent, D2 95 (PTCA); e. 09/2017 PCI: D2 99ost (CBA); d. 12/2017 Cath: LM nl, LAD 30m, 80d (small), D1 40ost, D2 95ost, LCX 40p, RCA 40ost/p->Med rx for D2 given restenosis.  . Colitis 06/03/2015  . Colon polyp   . CVA (cerebral vascular accident) (HCC)    Left side weakness.   . Degenerative tear of glenoid labrum of right shoulder 03/15/2017  . Diabetes mellitus without complication (HCC)   . Family history of breast cancer    BRCA neg 2014  . Femur fracture, left (HCC) 10/12/2018  . Gastric ulcer 04/27/2011  . History of echocardiogram    a. 03/2017 Echo: EF 60-65%, no rwma; b. 02/2018 Echo: EF 60-65%, no rwma.   Nl RV fxn. No cardiac source of emboli (admitted w/ stroke).  . Hypertension   . Malignant melanoma of skin of scalp (HCC)   . MI, acute, non ST segment elevation (HCC)   . Neuromuscular disorder (HCC)   . S/P drug eluting coronary stent placement 06/04/2015  . Sepsis (HCC) 03/18/2017  . Stroke (HCC)    a. 02/2018 MRI: 7mm late acute/early subacute L medial frontal lobe inarct; b. 02/2018 MRA No large  vessel occlusion or aneurysm. Mod to sev L P2 stenosis. thready L vertebral artery, diffusely dzs'd; c. 02/2018 Carotid U/S: <50% bilat ICA dzs.    Past Surgical History:  Procedure Laterality Date  . APPENDECTOMY    . BALLOON ENTEROSCOPY  02/06/2020   [facility]  . BIOPSY N/A 03/14/2020   Procedure: BIOPSY;  Surgeon: Wohl, Darren, MD;  Location: [facility];  Service: Endoscopy;  Laterality: N/A;  . CARDIAC CATHETERIZATION N/A 11/09/2014   Procedure: Coronary Angiography;  Surgeon: Timothy J Gollan, MD;  Location: [facility];  Service: Cardiovascular;  Laterality: N/A;  . CARDIAC CATHETERIZATION N/A 11/12/2014   Procedure: Coronary Stent Intervention;  Surgeon: Alexander Paraschos, MD;  Location: [facility];  Service: Cardiovascular;  Laterality: N/A;  . CARDIAC CATHETERIZATION N/A 04/18/2015   Procedure: Left Heart Cath and Coronary Angiography;  Surgeon: Timothy J Gollan, MD;  Location: [facility];  Service: Cardiovascular;  Laterality: N/A;  . CARDIAC CATHETERIZATION Left 06/04/2015   Procedure: Left Heart Cath and Coronary Angiography;  Surgeon: Muhammad A Arida, MD;  Location: [facility];  Service: Cardiovascular;  Laterality: Left;  . CARDIAC CATHETERIZATION N/A 06/04/2015   Procedure: Coronary Stent Intervention;  Surgeon: Muhammad A Arida, MD;  Location: [facility];  Service: Cardiovascular;  Laterality: N/A;  . CESAREAN SECTION  2001  . CHOLECYSTECTOMY N/A 11/18/2016   Procedure: LAPAROSCOPIC CHOLECYSTECTOMY WITH INTRAOPERATIVE CHOLANGIOGRAM;  Surgeon: Sankar, Seeplaputhur G, MD;  Location: [facility];  Service: General;  Laterality: N/A;  . COLONOSCOPY WITH PROPOFOL N/A 04/27/2016   Procedure: COLONOSCOPY WITH PROPOFOL;  Surgeon: Darren Wohl, MD;  Location: [facility];  Service: Endoscopy;  Laterality: N/A;  . COLONOSCOPY WITH PROPOFOL N/A 01/12/2018   Procedure: COLONOSCOPY WITH PROPOFOL;  Surgeon: Toledo, Teodoro K, MD;   Location: [facility];  Service: Endoscopy;  Laterality: N/A;  . COLONOSCOPY WITH PROPOFOL N/A 03/14/2020   Procedure: COLONOSCOPY WITH PROPOFOL;  Surgeon: Wohl, Darren, MD;  Location: [facility];  Service: Endoscopy;  Laterality: N/A;  Needs to be scheduled after 7:30 due to driver issues  . CORONARY ANGIOPLASTY    . CORONARY BALLOON ANGIOPLASTY N/A 06/29/2017   Procedure: CORONARY BALLOON ANGIOPLASTY;  Surgeon: Arida, Muhammad A, MD;  Location: [facility];  Service: Cardiovascular;  Laterality: N/A;  . CORONARY BALLOON ANGIOPLASTY N/A 09/20/2017   Procedure: CORONARY BALLOON ANGIOPLASTY;  Surgeon: Arida, Muhammad A, MD;  Location: [facility];  Service: Cardiovascular;  Laterality: N/A;  . CORONARY STENT INTERVENTION N/A 12/13/2019   Procedure: CORONARY STENT INTERVENTION;  Surgeon: Arida, Muhammad A, MD;  Location: [facility];  Service: Cardiovascular;  Laterality: N/A;  . DILATION AND CURETTAGE OF UTERUS    . ESOPHAGOGASTRODUODENOSCOPY (EGD) WITH PROPOFOL N/A 09/14/2014   Procedure: ESOPHAGOGASTRODUODENOSCOPY (EGD) WITH PROPOFOL;  Surgeon: Matthew Gordon Rein, MD;  Location: [facility];  Service: Endoscopy;  Laterality: N/A;  . ESOPHAGOGASTRODUODENOSCOPY (EGD) WITH PROPOFOL N/A 04/27/2016   Procedure: ESOPHAGOGASTRODUODENOSCOPY (EGD) WITH PROPOFOL;  Surgeon: Darren Wohl, MD;    Location: [facility];  Service: Endoscopy;  Laterality: N/A;  Diabetic - oral meds  . ESOPHAGOGASTRODUODENOSCOPY (EGD) WITH PROPOFOL N/A 01/12/2018   Procedure: ESOPHAGOGASTRODUODENOSCOPY (EGD) WITH PROPOFOL;  Surgeon: Toledo, Benay Pike, MD;  Location: [facility];  Service: Endoscopy;  Laterality: N/A;  . ESOPHAGOGASTRODUODENOSCOPY (EGD) WITH PROPOFOL N/A 04/11/2019   Procedure: ESOPHAGOGASTRODUODENOSCOPY (EGD) WITH PROPOFOL;  Surgeon: [name], MD;  Location: [facility];  Service: Endoscopy;  Laterality: N/A;  . ESOPHAGOGASTRODUODENOSCOPY (EGD) WITH PROPOFOL N/A  03/08/2020   Procedure: ESOPHAGOGASTRODUODENOSCOPY (EGD) WITH PROPOFOL;  Surgeon: [name], MD;  Location: [facility];  Service: Endoscopy;  Laterality: N/A;  . GASTRIC BYPASS  09/2009   [place] (IM) NAIL INTERTROCHANTERIC Left 10/13/2018   Procedure: INTRAMEDULLARY (IM) NAIL INTERTROCHANTRIC;  Surgeon: Leandrew Koyanagi, MD;  Location: [facility];  Service: Orthopedics;  Laterality: Left;  . Left Carotid to sublcavian artery bypass w/ subclavian artery ligation     a. Performed @ [place].  . LEFT HEART CATH AND CORONARY ANGIOGRAPHY Left 06/29/2017   Procedure: LEFT HEART CATH AND CORONARY ANGIOGRAPHY;  Surgeon: Wellington Hampshire, MD;  Location: [facility];  Service: Cardiovascular;  Laterality: Left;  . LEFT HEART CATH AND CORONARY ANGIOGRAPHY N/A 09/20/2017   Procedure: LEFT HEART CATH AND CORONARY ANGIOGRAPHY;  Surgeon: Wellington Hampshire, MD;  Location: [facility];  Service: Cardiovascular;  Laterality: N/A;  . LEFT HEART CATH AND CORONARY ANGIOGRAPHY N/A 12/20/2017   Procedure: LEFT HEART CATH AND CORONARY ANGIOGRAPHY;  Surgeon: Wellington Hampshire, MD;  Location: [facility];  Service: Cardiovascular;  Laterality: N/A;  . LEFT HEART CATH AND CORONARY ANGIOGRAPHY N/A 04/20/2019   Procedure: LEFT HEART CATH AND CORONARY ANGIOGRAPHY possible PCI;  Surgeon: Minna Merritts, MD;  Location: [facility];  Service: Cardiovascular;  Laterality: N/A;  . LEFT HEART CATH AND CORONARY ANGIOGRAPHY N/A 12/13/2019   Procedure: LEFT HEART CATH AND CORONARY ANGIOGRAPHY;  Surgeon: Wellington Hampshire, MD;  Location: [place] [facility];  Service: Cardiovascular;  Laterality: N/A;  . MELANOMA EXCISION  2016   Dr. Evorn Gong  . [place]  2002  . RIGHT OOPHORECTOMY    . SHOULDER ARTHROSCOPY WITH OPEN ROTATOR CUFF REPAIR Right 01/07/2016   Procedure: SHOULDER ARTHROSCOPY WITH DEBRIDMENT, SUBACHROMIAL DECOMPRESSION;  Surgeon: Corky Mull,  MD;  Location: [facility];  Service: Orthopedics;  Laterality: Right;  . SHOULDER ARTHROSCOPY WITH OPEN ROTATOR CUFF REPAIR Right 03/16/2017   Procedure: SHOULDER ARTHROSCOPY WITH OPEN ROTATOR CUFF REPAIR POSSIBLE BICEPS TENODESIS;  Surgeon: Corky Mull, MD;  Location: [facility];  Service: Orthopedics;  Laterality: Right;  . TRIGGER FINGER RELEASE Right     [place]     reports that she quit smoking about 25 years ago. Her smoking use included cigarettes. She has a 0.25 pack-year smoking history. She has never used smokeless tobacco. She reports that she does not drink alcohol and does not use drugs.  Allergies  Allergen Reactions  . Lipitor [Atorvastatin] Other (See Comments)    Leg pains  . Tramadol Other (See Comments)    Mouth feels like it's on fire    Family History  Problem Relation Age of Onset  . Hypertension Mother   . Anxiety disorder Mother   . Depression Mother   . Bipolar disorder Mother   . Heart disease Mother        No details  . Hyperlipidemia Mother   . Kidney  disease Father   . Heart disease Father 40  . Hypertension Father   . Diabetes Father   . Stroke Father   . Colon cancer Father        dx in his 40's  . Anxiety disorder Father   . Depression Father   . Skin cancer Father   . Kidney disease Sister   . Thyroid nodules Sister   . Hypertension Sister   . Hypertension Sister   . Diabetes Sister   . Hyperlipidemia Sister   . Depression Sister   . Breast cancer Maternal Aunt 50  . Breast cancer Maternal Aunt 50  . Ovarian cancer Cousin   . Colon cancer Cousin   . Kidney cancer Cousin   . Breast cancer Other   . Bladder Cancer Neg Hx       Prior to Admission medications   Medication Sig Start Date End Date Taking? Authorizing Provider  AJOVY 225 MG/1.5ML SOAJ Inject into the skin. 04/08/20  Yes [provider]  alosetron (LOTRONEX) 1 MG tablet Take 1 tablet (1 mg total) by mouth 2 (two) times daily. 03/18/20  Yes Wohl, Darren, MD   ALPRAZolam (XANAX) 1 MG tablet Take 1 tablet by mouth twice daily as needed Patient taking differently: Take 2 mg by mouth at bedtime. 02/16/20  Yes Fisher, Donald E, MD  amLODipine (NORVASC) 2.5 MG tablet Take 1 tablet (2.5 mg total) by mouth daily. 10/25/19  Yes Gollan, Timothy J, MD  buPROPion (WELLBUTRIN SR) 200 MG 12 hr tablet Take 200 mg by mouth every morning. 11/17/19  Yes [provider]  carvedilol (COREG) 25 MG tablet Take 1 tablet (25 mg total) by mouth 2 (two) times daily with a meal. 02/27/20  Yes Gollan, Timothy J, MD  clopidogrel (PLAVIX) 75 MG tablet Take 75 mg by mouth daily.   Yes [provider]  desvenlafaxine (PRISTIQ) 50 MG 24 hr tablet Take 50 mg by mouth daily. 12/11/19  Yes [provider]  diphenoxylate-atropine (LOMOTIL) 2.5-0.025 MG tablet Take 1 tablet by mouth 4 (four) times daily as needed for diarrhea or loose stools. 01/29/20  Yes Wohl, Darren, MD  EUTHYROX 25 MCG tablet TAKE 1 TABLET BY MOUTH ONCE DAILY BEFORE BREAKFAST 12/02/19  Yes Fisher, Donald E, MD  famotidine (PEPCID) 20 MG tablet Take 20 mg by mouth 2 (two) times daily.   Yes [provider]  gabapentin (NEURONTIN) 300 MG capsule Take 2 capsules (600 mg total) by mouth 2 (two) times daily. 01/17/20  Yes Fisher, Donald E, MD  Galcanezumab-gnlm (EMGALITY) 120 MG/ML SOAJ Inject 120 mg into the skin every 28 (twenty-eight) days. 03/01/20  Yes [provider]  isosorbide mononitrate (IMDUR) 120 MG 24 hr tablet Take 1 tablet (120 mg total) by mouth daily. 03/20/20  Yes Gollan, Timothy J, MD  nitroGLYCERIN (NITROSTAT) 0.4 MG SL tablet Place 1 tablet (0.4 mg total) under the tongue every 5 (five) minutes as needed for chest pain. 10/20/19  Yes Girguis, David, MD  Polyethyl Glycol-Propyl Glycol (SYSTANE OP) Place 1 drop into both eyes daily as needed (dry eyes).   Yes [provider]  promethazine (PHENERGAN) 25 MG tablet TAKE 1 TABLET BY MOUTH EVERY 6 HOURS AS NEEDED  FOR NAUSEA FOR VOMITING 02/07/20  Yes Fisher, Donald E, MD  rosuvastatin (CRESTOR) 40 MG tablet Take 1 tablet (40 mg total) by mouth daily at 6 PM. 10/25/19  Yes Gollan, Timothy J, MD  terconazole (TERAZOL 7) 0.4 % vaginal cream Place 1   applicator vaginally at bedtime for 5 days. Use this the first week. 04/18/20 04/23/20 Yes Flinchum, Michelle S, FNP  traZODone (DESYREL) 100 MG tablet TAKE 1 TO 2 TABLETS BY MOUTH AT BEDTIME Patient taking differently: Take 100 mg by mouth at bedtime. 11/15/19  Yes Fisher, Donald E, MD  metroNIDAZOLE (METROGEL VAGINAL) 0.75 % vaginal gel Place 1 Applicatorful vaginally daily for 5 days. Use this the second week. 04/18/20 04/23/20  Flinchum, Michelle S, FNP    Physical Exam: Vitals:   04/23/20 1330 04/23/20 1400 04/23/20 1430 04/23/20 1500  BP: (!) 84/57 (!) 96/51 101/77 113/65  Pulse: 73 73 79 71  Resp: 15 14 12 11  Temp:      TempSrc:      SpO2: 100% 100% 100% 100%  Weight:      Height:         Vitals:   04/23/20 1330 04/23/20 1400 04/23/20 1430 04/23/20 1500  BP: (!) 84/57 (!) 96/51 101/77 113/65  Pulse: 73 73 79 71  Resp: 15 14 12 11  Temp:      TempSrc:      SpO2: 100% 100% 100% 100%  Weight:      Height:          Constitutional: Alert and oriented x 3 . Not in any apparent distress.  Looks older than stated age. HEENT:      Head: Normocephalic and atraumatic.         Eyes: PERLA, EOMI, Conjunctivae pallor. Sclera is non-icteric.       Mouth/Throat: Mucous membranes are moist.       Neck: Supple with no signs of meningismus. Cardiovascular: Irregularly irregular. No murmurs, gallops, or rubs. 2+ symmetrical distal pulses are present . No JVD. No LE edema Respiratory: Respiratory effort normal .Lungs sounds clear bilaterally. No wheezes, crackles, or rhonchi.  Gastrointestinal: Soft, tenderness in the suprapubic area, and non distended with positive bowel sounds.  Genitourinary: No CVA tenderness. Musculoskeletal: Nontender with normal range  of motion in all extremities. No cyanosis, or erythema of extremities. Neurologic:  Face is symmetric. Moving all extremities. No gross focal neurologic deficits  Skin: Skin is warm, dry.  No rash or ulcers Psychiatric: Mood and affect are normal   Labs on Admission: I have personally reviewed following labs and imaging studies  CBC: Recent Labs  Lab 04/18/20 1401 04/23/20 1145  WBC 12.4* 9.1  NEUTROABS 9.4* 6.7  HGB 11.0* 11.4*  HCT 35.4 37.0  MCV 82 81.3  PLT 259 286   Basic Metabolic Panel: Recent Labs  Lab 04/18/20 1401 04/23/20 1145 04/23/20 1555  NA 139 137 138  K 4.6 6.1* 5.8*  CL 103 107 112*  CO2 18* 22 21*  GLUCOSE 95 178* 56*  BUN 20 26* 24*  CREATININE 1.79* 1.71* 1.67*  CALCIUM 8.4* 8.5* 7.6*   GFR: Estimated Creatinine Clearance: 36.4 mL/min (A) (by C-G formula based on SCr of 1.67 mg/dL (H)). Liver Function Tests: Recent Labs  Lab 04/18/20 1401 04/23/20 1145  AST 19 23  ALT 16 23  ALKPHOS 171* 142*  BILITOT 0.2 0.5  PROT 5.8* 6.1*  ALBUMIN 3.2* 2.9*   No results for input(s): LIPASE, AMYLASE in the last 168 hours. No results for input(s): AMMONIA in the last 168 hours. Coagulation Profile: Recent Labs  Lab 04/23/20 1145  INR 1.2   Cardiac Enzymes: No results for input(s): CKTOTAL, CKMB, CKMBINDEX, TROPONINI in the last 168 hours. BNP (last 3 results) No results for input(s): PROBNP   in the last 8760 hours. HbA1C: No results for input(s): HGBA1C in the last 72 hours. CBG: No results for input(s): GLUCAP in the last 168 hours. Lipid Profile: No results for input(s): CHOL, HDL, LDLCALC, TRIG, CHOLHDL, LDLDIRECT in the last 72 hours. Thyroid Function Tests: No results for input(s): TSH, T4TOTAL, FREET4, T3FREE, THYROIDAB in the last 72 hours. Anemia Panel: No results for input(s): VITAMINB12, FOLATE, FERRITIN, TIBC, IRON, RETICCTPCT in the last 72 hours. Urine analysis:    Component Value Date/Time   COLORURINE YELLOW (A) 04/23/2020  1145   APPEARANCEUR CLOUDY (A) 04/23/2020 1145   APPEARANCEUR Cloudy (A) 07/17/2014 1455   LABSPEC 1.012 04/23/2020 1145   LABSPEC 1.012 12/29/2013 1210   PHURINE 5.0 04/23/2020 1145   GLUCOSEU NEGATIVE 04/23/2020 1145   GLUCOSEU >=500 12/29/2013 1210   HGBUR NEGATIVE 04/23/2020 1145   HGBUR negative 04/14/2010 1435   BILIRUBINUR NEGATIVE 04/23/2020 1145   BILIRUBINUR negative 04/18/2020 1326   BILIRUBINUR Negative 07/17/2014 1455   BILIRUBINUR Negative 12/29/2013 1210   KETONESUR NEGATIVE 04/23/2020 1145   PROTEINUR NEGATIVE 04/23/2020 1145   UROBILINOGEN 0.2 04/18/2020 1326   UROBILINOGEN negative 04/14/2010 1435   NITRITE NEGATIVE 04/23/2020 1145   LEUKOCYTESUR LARGE (A) 04/23/2020 1145   LEUKOCYTESUR Negative 12/29/2013 1210    Radiological Exams on Admission: US EKG SITE RITE  Result Date: 04/23/2020 If Site Rite image not attached, placement could not be confirmed due to current cardiac rhythm.    Assessment/Plan Principal Problem:   Sepsis secondary to UTI (HCC) Active Problems:   History of CVA with residual deficit   CAD (coronary artery disease)   H/O gastric bypass   Hypertension associated with stage 3 chronic kidney disease due to type 2 diabetes mellitus (HCC)   Depression, major, single episode, moderate (HCC)   Diabetes mellitus (HCC)   Hyperkalemia    Sepsis secondary to UTI (POA) As evidenced by hypotension that responded to IV fluid resuscitation, elevated lactic acid and pyuria Urine culture from 04/18/20-year-old multidrug-resistant E. Coli Continue aggressive IV fluid resuscitation Start patient on meropenem adjusted to renal function Follow-up results of blood and urine culture    Diabetes mellitus with complications of stage III chronic kidney disease Maintain consistent carbohydrate diet Renal function is stable Glycemic control with sliding scale insulin    Hyperkalemia Most likely secondary to RTA type IV We will treat with  dextrose, insulin and Lokelma Repeat potassium levels in a.m.     New onset atrial fibrillation Patient is rate controlled Patient has a CHA2DS2-VASc score 6 and ideally requires anticoagulation as secondary prophylaxis for an acute stroke Patient is not a candidate for long-term anticoagulation due to history of recurrent GI bleeding requiring blood transfusion Obtain TSH levels Obtain 2D echocardiogram to rule out valvular pathology    Hypothyroidism Continue Synthroid   History of CVA Continue Plavix and statins    Anxiety and depression Stable Continue Effexor, bupropion, trazodone and alprazolam    History of coronary artery disease Continue Plavix and statins Hold metoprolol and nitrates due to hypotension requiring IV fluid resuscitation    Hypertension Hold all antihypertensive medications for now due to hypotension requiring IV fluid resuscitation   DVT prophylaxis: Lovenox Code Status: full code Family Communication: Greater than 50% of time was spent discussing patient's condition and plan of care with her at the bedside.  All questions and concerns have been addressed.  She verbalizes understanding and agrees with the plan. Disposition Plan: Back to previous home environment Consults called: none   Status: At the time of admission, it appears that the appropriate admission status for this patient is inpatient.  This is judged to be reasonable and necessary in order to provide the required intensity of service to ensure the patient's safety given the presenting symptoms, physical exam findings and initial radiographic and laboratory data in the context of their comorbid conditions. Patient requires inpatient status due to high intensity of service, high risk for further deterioration and high frequency of surveillance required.    Tochukwu Agbata MD [facility]     04/23/2020, 5:33 PM   

## 2020-04-23 NOTE — ED Notes (Signed)
Echo at bedside at this time

## 2020-04-23 NOTE — Consult Note (Signed)
CODE SEPSIS - PHARMACY COMMUNICATION  **Broad Spectrum Antibiotics should be administered within 1 hour of Sepsis diagnosis**  Time Code Sepsis Called/Page Received: 1255  Antibiotics Ordered: Zosyn x1 (1300)  Time of 1st antibiotic administration: 1307  Additional action taken by pharmacy: N/A  If necessary, Name of Provider/Nurse Contacted: N/A  Lorna Dibble ,PharmD Clinical Pharmacist  04/23/2020  1:21 PM

## 2020-04-23 NOTE — ED Provider Notes (Signed)
[facility] Emergency Department Provider Note   ____________________________________________   Event Date/Time   First MD Initiated Contact with Patient 04/23/20 1150     (approximate)  I have reviewed the triage vital signs and the nursing notes.   HISTORY  Chief Complaint Urinary Tract Infection    HPI [name] is a 50 y.o. female with a past medical history of recurrent urinary tract infection with ESBL history, CVA, type 2 diabetes, and CAD who presents for continued dysuria and suprapubic abdominal pain that she says is normal for urinary tract infections.  Patient states that she went to see her primary care doctor on 04/18/2020 and was started on doxycycline which she is still taking at this time however is having worsening symptoms.  Patient describes 10/10, nonradiating, suprapubic abdominal pain that has no exacerbating or relieving factors.  Patient currently denies any vision changes, tinnitus, difficulty speaking, facial droop, sore throat, chest pain, shortness of breath, nausea/vomiting/diarrhea, or weakness/numbness/paresthesias in any extremity         Past Medical History:  Diagnosis Date  . Acute colitis 01/27/2017  . Acute pyelonephritis   . Anemia    iron deficiency anemia  . Aortic arch aneurysm ([place])   . BRCA negative 2014  . CAD (coronary artery disease)    a. 08/2003 Cath: LAD 30-40-med Rx; b. 11/2014 PCI: LAD 64m(3.25x23 Xience Alpine DES); c. 06/2015 PCI: D1 (2.25x12 Resolute Integrity DES); d. 06/2017 PCI: Patent mLAD stent, D2 95 (PTCA); e. 09/2017 PCI: D2 99ost (CBA); d. 12/2017 Cath: LM nl, LAD 332m80d (small), D1 40ost, D2 95ost, LCX 40p, RCA 40ost/p->Med rx for D2 given restenosis.  . Colitis 06/03/2015  . Colon polyp   . CVA (cerebral vascular accident) ([place]   Left side weakness.   . Degenerative tear of glenoid labrum of right shoulder 03/15/2017  . Diabetes mellitus without complication (HCCru[place]  . Family  history of breast cancer    BRCA neg 2014  . Femur fracture, left (HCSiesta Key9/10/2018  . Gastric ulcer 04/27/2011  . History of echocardiogram    a. 03/2017 Echo: EF 60-65%, no rwma; b. 02/2018 Echo: EF 60-65%, no rwma. Nl RV fxn. No cardiac source of emboli (admitted w/ stroke).  . Hypertension   . Malignant melanoma of skin of scalp (HCKinta  . MI, acute, non ST segment elevation (HCWhe[place]  . Neuromuscular disorder (HCCastleberry  . S/P drug eluting coronary stent placement 06/04/2015  . Sepsis (HCEmigrant2/14/2019  . St[facility] ([facility]   a. 02/2018 MRI: 75m78mate acute/early subacute L medial frontal lobe inarct; b. 02/2018 MRA No large vessel occlusion or aneurysm. Mod to sev L P2 stenosis. thready L vertebral artery, diffusely dzs'd; c. 02/2018 Carotid U/S: <50% bilat ICA dzs.    Patient Active Problem List   Diagnosis Date Noted  . Sepsis secondary to UTI (HCCIberville3/22/2022  . Hyperkalemia 04/23/2020  . Unspecified atrial fibrillation (HCCEast Newnan3/22/2022  . Family history of malignant neoplasm of gastrointestinal tract   . Iron deficiency anemia secondary to blood loss (chronic)   . Acute on chronic anemia 03/07/2020  . AKI (acute kidney injury) (HCCNeponset2/04/2020  . Upper GI bleed   . Near syncope 01/25/2020  . Diabetes mellitus (HCCSarat[place]2/23/2021  . Dehydration 01/25/2020  . Acute upper GI bleed 01/25/2020  . Pain in limb 01/05/2020  . Pulmonary embolism (HCCHayden0/06/2019  . Chest pain 11/05/2019  . Type II diabetes mellitus  with renal manifestations (HCC) 11/05/2019  . Acute renal failure superimposed on stage 3a chronic kidney disease (HCC) 09/02/2019  . Hypoglycemia   . Family history of breast cancer 05/23/2019  . Family history of colon cancer 05/23/2019  . Retinopathy 05/12/2019  . Abnormal LFTs 04/18/2019  . Esophageal dysphagia   . Gastrointestinal tract imaging abnormality   . Trochanteric bursitis, left hip 03/17/2019  . Malignant neoplasm of vulva, unspecified (HCC) 03/10/2019  . Peripheral  vascular disease, unspecified (HCC) 03/10/2019  . Paroxysmal supraventricular tachycardia (HCC) 03/10/2019  . Contusion of left hip 03/06/2019  . Chest pain, moderate coronary artery risk 01/27/2019  . Melena   . Recurrent chest pain 01/19/2019  . Depression, major, single episode, moderate (HCC) 12/09/2018  . Closed nondisplaced intertrochanteric fracture of left femur (HCC) 10/12/2018  . Hypotension 06/26/2018  . Autonomic neuropathy 03/24/2018  . Acute delirium 03/03/2018  . H/O gastric bypass 03/02/2018  . Hypertension associated with stage 3 chronic kidney disease due to type 2 diabetes mellitus (HCC) 02/20/2018  . SI (sacroiliac) joint dysfunction 12/02/2017  . Unstable angina (HCC) 06/24/2017  . Syncope 04/09/2017  . Insomnia 03/18/2017  . Ischemic cardiomyopathy   . Arthritis   . Anxiety   . Tendinitis of upper biceps tendon of right shoulder 03/16/2017  . Gastroenteritis 01/27/2017  . Hx of colonic polyps   . H/O medication noncompliance 12/14/2015  . CAD (coronary artery disease)   . Hypertensive heart disease   . Status post bariatric surgery 06/04/2015  . Carotid stenosis 04/30/2015  . Stable angina pectoris (HCC) 04/17/2015  . Iron deficiency anemia 03/22/2015  . Vitamin B12 deficiency 02/18/2015  . Misuse of medications for pain 02/18/2015  . Major depressive disorder, recurrent, severe with psychotic features (HCC) 02/15/2015  . Helicobacter pylori infection 11/23/2014  . Malignant melanoma (HCC) 08/25/2014  . Incomplete bladder emptying 07/12/2014  . Hypothyroidism 12/30/2013  . Aberrant subclavian artery 11/17/2013  . Multiple sclerosis (HCC) 11/02/2013  . History of CVA with residual deficit 06/20/2013  . Headache, migraine 05/29/2013  . Hyperlipidemia   . GERD (gastroesophageal reflux disease)   . Neuropathy (HCC) 01/02/2011  . Stroke (HCC) 06/21/2008  . Depression with anxiety 05/01/2008  . Essential hypertension 05/01/2008    Past Surgical  History:  Procedure Laterality Date  . APPENDECTOMY    . BALLOON ENTEROSCOPY  02/06/2020   [facility]  . BIOPSY N/A 03/14/2020   Procedure: BIOPSY;  Surgeon: Wohl, Darren, MD;  Location: [facility];  Service: Endoscopy;  Laterality: N/A;  . CARDIAC CATHETERIZATION N/A 11/09/2014   Procedure: Coronary Angiography;  Surgeon: Timothy J Gollan, MD;  Location: [facility];  Service: Cardiovascular;  Laterality: N/A;  . CARDIAC CATHETERIZATION N/A 11/12/2014   Procedure: Coronary Stent Intervention;  Surgeon: Alexander Paraschos, MD;  Location: [facility];  Service: Cardiovascular;  Laterality: N/A;  . CARDIAC CATHETERIZATION N/A 04/18/2015   Procedure: Left Heart Cath and Coronary Angiography;  Surgeon: Timothy J Gollan, MD;  Location: [facility];  Service: Cardiovascular;  Laterality: N/A;  . CARDIAC CATHETERIZATION Left 06/04/2015   Procedure: Left Heart Cath and Coronary Angiography;  Surgeon: Muhammad A Arida, MD;  Location: [facility];  Service: Cardiovascular;  Laterality: Left;  . CARDIAC CATHETERIZATION N/A 06/04/2015   Procedure: Coronary Stent Intervention;  Surgeon: Muhammad A Arida, MD;  Location: [facility];  Service: Cardiovascular;  Laterality: N/A;  . CESAREAN SECTION  2001  . CHOLECYSTECTOMY N/A 11/18/2016   Procedure:   LAPAROSCOPIC CHOLECYSTECTOMY WITH INTRAOPERATIVE CHOLANGIOGRAM;  Surgeon: Sankar, Seeplaputhur G, MD;  Location: [facility];  Service: General;  Laterality: N/A;  . COLONOSCOPY WITH PROPOFOL N/A 04/27/2016   Procedure: COLONOSCOPY WITH PROPOFOL;  Surgeon: Darren Wohl, MD;  Location: [facility];  Service: Endoscopy;  Laterality: N/A;  . COLONOSCOPY WITH PROPOFOL N/A 01/12/2018   Procedure: COLONOSCOPY WITH PROPOFOL;  Surgeon: Toledo, Teodoro K, MD;  Location: [facility];  Service: Endoscopy;  Laterality: N/A;  . COLONOSCOPY WITH PROPOFOL N/A 03/14/2020   Procedure: COLONOSCOPY WITH PROPOFOL;  Surgeon: Wohl,  Darren, MD;  Location: [facility];  Service: Endoscopy;  Laterality: N/A;  Needs to be scheduled after 7:30 due to driver issues  . CORONARY ANGIOPLASTY    . CORONARY BALLOON ANGIOPLASTY N/A 06/29/2017   Procedure: CORONARY BALLOON ANGIOPLASTY;  Surgeon: Arida, Muhammad A, MD;  Location: [facility];  Service: Cardiovascular;  Laterality: N/A;  . CORONARY BALLOON ANGIOPLASTY N/A 09/20/2017   Procedure: CORONARY BALLOON ANGIOPLASTY;  Surgeon: Arida, Muhammad A, MD;  Location: [facility];  Service: Cardiovascular;  Laterality: N/A;  . CORONARY STENT INTERVENTION N/A 12/13/2019   Procedure: CORONARY STENT INTERVENTION;  Surgeon: Arida, Muhammad A, MD;  [facility];  Service: Cardiovascular;  Laterality: N/A;  . DILATION AND CURETTAGE OF UTERUS    . ESOPHAGOGASTRODUODENOSCOPY (EGD) WITH PROPOFOL N/A 09/14/2014   Procedure: ESOPHAGOGASTRODUODENOSCOPY (EGD) WITH PROPOFOL;  Surgeon: Matthew Gordon Rein, MD;  Location: [facility];  Service: Endoscopy;  Laterality: N/A;  . ESOPHAGOGASTRODUODENOSCOPY (EGD) WITH PROPOFOL N/A 04/27/2016   Procedure: ESOPHAGOGASTRODUODENOSCOPY (EGD) WITH PROPOFOL;  Surgeon: Darren Wohl, MD;  Location: [facility];  Service: Endoscopy;  Laterality: N/A;  Diabetic - oral meds  . ESOPHAGOGASTRODUODENOSCOPY (EGD) WITH PROPOFOL N/A 01/12/2018   Procedure: ESOPHAGOGASTRODUODENOSCOPY (EGD) WITH PROPOFOL;  Surgeon: Toledo, Teodoro K, MD;  Location: [facility];  Service: Endoscopy;  Laterality: N/A;  . ESOPHAGOGASTRODUODENOSCOPY (EGD) WITH PROPOFOL N/A 04/11/2019   Procedure: ESOPHAGOGASTRODUODENOSCOPY (EGD) WITH PROPOFOL;  Surgeon: Wohl, Darren, MD;  Location: [facility];  Service: Endoscopy;  Laterality: N/A;  . ESOPHAGOGASTRODUODENOSCOPY (EGD) WITH PROPOFOL N/A 03/08/2020   Procedure: ESOPHAGOGASTRODUODENOSCOPY (EGD) WITH PROPOFOL;  Surgeon: Wohl, Darren, MD;  Location: [facility];  Service: Endoscopy;  Laterality: N/A;  .  GASTRIC BYPASS  09/2009   [facility]   . INTRAMEDULLARY (IM) NAIL INTERTROCHANTERIC Left 10/13/2018   Procedure: INTRAMEDULLARY (IM) NAIL INTERTROCHANTRIC;  Surgeon: Xu, Naiping M, MD;  [facility];  Service: Orthopedics;  Laterality: Left;  . Left Carotid to sublcavian artery bypass w/ subclavian artery ligation     a. Performed @ [facility].  . LEFT HEART CATH AND CORONARY ANGIOGRAPHY Left 06/29/2017   Procedure: LEFT HEART CATH AND CORONARY ANGIOGRAPHY;  Surgeon: Arida, Muhammad A, MD;  Location: [facility];  Service: Cardiovascular;  Laterality: Left;  . LEFT HEART CATH AND CORONARY ANGIOGRAPHY N/A 09/20/2017   Procedure: LEFT HEART CATH AND CORONARY ANGIOGRAPHY;  Surgeon: Arida, Muhammad A, MD;  Location: [facility];  Service: Cardiovascular;  Laterality: N/A;  . LEFT HEART CATH AND CORONARY ANGIOGRAPHY N/A 12/20/2017   Procedure: LEFT HEART CATH AND CORONARY ANGIOGRAPHY;  Surgeon: Arida, Muhammad A, MD;  Location: [facility];  Service: Cardiovascular;  Laterality: N/A;  . LEFT HEART CATH AND CORONARY ANGIOGRAPHY N/A 04/20/2019   Procedure: LEFT HEART CATH AND CORONARY ANGIOGRAPHY possible PCI;  Surgeon: Gollan, Timothy J, MD;  Location: [facility];  Service: Cardiovascular;  Laterality:   N/A;  . LEFT HEART CATH AND CORONARY ANGIOGRAPHY N/A 12/13/2019   Procedure: LEFT HEART CATH AND CORONARY ANGIOGRAPHY;  Surgeon: Wellington Hampshire, MD;  Location: [place] [facility];  Service: Cardiovascular;  Laterality: N/A;  . MELANOMA EXCISION  2016   Dr. Evorn Gong  . [place]  2002  . RIGHT OOPHORECTOMY    . SHOULDER ARTHROSCOPY WITH OPEN ROTATOR CUFF REPAIR Right 01/07/2016   Procedure: SHOULDER ARTHROSCOPY WITH DEBRIDMENT, SUBACHROMIAL DECOMPRESSION;  Surgeon: Corky Mull, MD;  Location: [facility];  Service: Orthopedics;  Laterality: Right;  . SHOULDER ARTHROSCOPY WITH OPEN ROTATOR CUFF REPAIR Right 03/16/2017   Procedure: SHOULDER  ARTHROSCOPY WITH OPEN ROTATOR CUFF REPAIR POSSIBLE BICEPS TENODESIS;  Surgeon: Corky Mull, MD;  Location: [facility];  Service: Orthopedics;  Laterality: Right;  . TRIGGER FINGER RELEASE Right     [place]    Prior to Admission medications   Medication Sig Start Date End Date Taking? Authorizing Provider  AJOVY 225 MG/1.5ML SOAJ Inject into the skin. 04/08/20  Yes [provider]  alosetron (LOTRONEX) 1 MG tablet Take 1 tablet (1 mg total) by mouth 2 (two) times daily. 03/18/20  Yes [name], MD  ALPRAZolam Duanne Moron) 1 MG tablet Take 1 tablet by mouth twice daily as needed Patient taking differently: Take 2 mg by mouth at bedtime. 02/16/20  Yes [name], MD  amLODipine (NORVASC) 2.5 MG tablet Take 1 tablet (2.5 mg total) by mouth daily. 10/25/19  Yes Minna Merritts, MD  buPROPion (WELLBUTRIN SR) 200 MG 12 hr tablet Take 200 mg by mouth every morning. 11/17/19  Yes [provider]  carvedilol (COREG) 25 MG tablet Take 1 tablet (25 mg total) by mouth 2 (two) times daily with a meal. 02/27/20  Yes Gollan, Kathlene November, MD  clopidogrel (PLAVIX) 75 MG tablet Take 75 mg by mouth daily.   Yes [provider]  desvenlafaxine (PRISTIQ) 50 MG 24 hr tablet Take 50 mg by mouth daily. 12/11/19  Yes [provider]  diphenoxylate-atropine (LOMOTIL) 2.5-0.025 MG tablet Take 1 tablet by mouth 4 (four) times daily as needed for diarrhea or loose stools. 01/29/20  Yes [name], MD  EUTHYROX 25 MCG tablet TAKE 1 TABLET BY MOUTH ONCE DAILY BEFORE BREAKFAST 12/02/19  Yes [name], MD  famotidine (PEPCID) 20 MG tablet Take 20 mg by mouth 2 (two) times daily.   Yes [provider]  gabapentin (NEURONTIN) 300 MG capsule Take 2 capsules (600 mg total) by mouth 2 (two) times daily. 01/17/20  Yes [name], MD  Galcanezumab-gnlm [facility]) 120 MG/ML SOAJ Inject 120 mg into the skin every 28 (twenty-eight) days. 03/01/20  Yes [provider]   isosorbide mononitrate (IMDUR) 120 MG 24 hr tablet Take 1 tablet (120 mg total) by mouth daily. 03/20/20  Yes Gollan, Kathlene November, MD  nitroGLYCERIN (NITROSTAT) 0.4 MG SL tablet Place 1 tablet (0.4 mg total) under the tongue every 5 (five) minutes as needed for chest pain. 10/20/19  Yes Dwyane Dee, MD  Polyethyl Glycol-Propyl Glycol (SYSTANE OP) Place 1 drop into both eyes daily as needed (dry eyes).   Yes [provider]  promethazine (PHENERGAN) 25 MG tablet TAKE 1 TABLET BY MOUTH EVERY 6 HOURS AS NEEDED FOR NAUSEA FOR VOMITING 02/07/20  Yes [name], MD  rosuvastatin (CRESTOR) 40 MG tablet Take 1 tablet (40 mg total) by mouth daily at 6 PM. 10/25/19  Yes Gollan, Kathlene November, MD  traZODone ([place])  100 MG tablet TAKE 1 TO 2 TABLETS BY MOUTH AT BEDTIME Patient taking differently: Take 100 mg by mouth at bedtime. 11/15/19  Yes [name], MD    Allergies Lipitor [atorvastatin] and Tramadol  Family History  Problem Relation Age of Onset  . Hypertension Mother   . Anxiety disorder Mother   . Depression Mother   . Bipolar disorder Mother   . Heart disease Mother        No details  . Hyperlipidemia Mother   . Kidney disease Father   . Heart disease Father 76  . Hypertension Father   . Diabetes Father   . Stroke Father   . Colon cancer Father        dx in his 59's  . Anxiety disorder Father   . Depression Father   . Skin cancer Father   . Kidney disease Sister   . Thyroid nodules Sister   . Hypertension Sister   . Hypertension Sister   . Diabetes Sister   . Hyperlipidemia Sister   . Depression Sister   . Breast cancer Maternal Aunt 31  . Breast cancer Maternal Aunt 33  . Ovarian cancer Cousin   . Colon cancer Cousin   . Kidney cancer Cousin   . Breast cancer Other   . Bladder Cancer Neg Hx     Social History Social History   Tobacco Use  . Smoking status: Former Smoker    Packs/day: 0.25    Years: 1.00    Pack years: 0.25    Types: Cigarettes     Quit date: 08/31/1994    Years since quitting: 25.6  . Smokeless tobacco: Never Used  . Tobacco comment: quit 28 years ago  Vaping Use  . Vaping Use: Never used  Substance Use Topics  . Alcohol use: No    Alcohol/week: 0.0 standard drinks  . Drug use: No    Review of Systems Constitutional: No fever/chills Eyes: No visual changes. ENT: No sore throat. Cardiovascular: Denies chest pain. Respiratory: Denies shortness of breath. Gastrointestinal: Endorses suprapubic abdominal pain.  No nausea, no vomiting.  No diarrhea. Genitourinary: Positive for dysuria. Musculoskeletal: Negative for acute arthralgias Skin: Negative for rash. Neurological: Negative for headaches, weakness/numbness/paresthesias in any extremity Psychiatric: Negative for suicidal ideation/homicidal ideation   ____________________________________________   PHYSICAL EXAM:  VITAL SIGNS: ED Triage Vitals [04/23/20 1136]  Enc Vitals Group     BP (!) 88/66     Pulse Rate 86     Resp 16     Temp 98 F (36.7 C)     Temp Source Oral     SpO2 100 %     Weight 145 lb (65.8 kg)     Height 5' 3" (1.6 m)     Head Circumference      Peak Flow      Pain Score 10     Pain Loc      Pain Edu?      Excl. in [place]?    Constitutional: Alert and oriented. Well appearing and in no acute distress. Eyes: Conjunctivae are normal. PERRL. Head: Atraumatic. Nose: No congestion/rhinnorhea. Mouth/Throat: Mucous membranes are moist. Neck: No stridor Cardiovascular: Grossly normal heart sounds.  Good peripheral circulation. Respiratory: Normal respiratory effort.  No retractions. Gastrointestinal: Soft and nontender. No distention. Musculoskeletal: No obvious deformities Neurologic:  Normal speech and language. No gross focal neurologic deficits are appreciated. Skin:  Skin is warm and dry. No rash noted. Psychiatric: Mood and affect are normal.  Speech and behavior are normal.  ____________________________________________    LABS (all labs ordered are listed, but only abnormal results are displayed)  Labs Reviewed  COMPREHENSIVE METABOLIC PANEL - Abnormal; Notable for the following components:      Result Value   Potassium 6.1 (*)    Glucose, Bld 178 (*)    BUN 26 (*)    Creatinine, Ser 1.71 (*)    Calcium 8.5 (*)    Total Protein 6.1 (*)    Albumin 2.9 (*)    Alkaline Phosphatase 142 (*)    GFR, Estimated 36 (*)    All other components within normal limits  LACTIC ACID, PLASMA - Abnormal; Notable for the following components:   Lactic Acid, Venous 3.9 (*)    All other components within normal limits  LACTIC ACID, PLASMA - Abnormal; Notable for the following components:   Lactic Acid, Venous 3.4 (*)    All other components within normal limits  CBC WITH DIFFERENTIAL/PLATELET - Abnormal; Notable for the following components:   Hemoglobin 11.4 (*)    MCH 25.1 (*)    All other components within normal limits  URINALYSIS, COMPLETE (UACMP) WITH MICROSCOPIC - Abnormal; Notable for the following components:   Color, Urine YELLOW (*)    APPearance CLOUDY (*)    Leukocytes,Ua LARGE (*)    WBC, UA >50 (*)    Bacteria, UA RARE (*)    All other components within normal limits  BASIC METABOLIC PANEL - Abnormal; Notable for the following components:   Potassium 5.8 (*)    Chloride 112 (*)    CO2 21 (*)    Glucose, Bld 56 (*)    BUN 24 (*)    Creatinine, Ser 1.67 (*)    Calcium 7.6 (*)    GFR, Estimated 37 (*)    All other components within normal limits  CBC - Abnormal; Notable for the following components:   RBC 3.45 (*)    Hemoglobin 8.8 (*)    HCT 28.2 (*)    MCH 25.5 (*)    nRBC 0.3 (*)    All other components within normal limits  LACTIC ACID, PLASMA - Abnormal; Notable for the following components:   Lactic Acid, Venous 3.3 (*)    All other components within normal limits  LACTIC ACID, PLASMA - Abnormal; Notable for the following components:   Lactic Acid, Venous 2.9 (*)    All other  components within normal limits  GLUCOSE, CAPILLARY - Abnormal; Notable for the following components:   Glucose-Capillary 40 (*)    All other components within normal limits  GLUCOSE, CAPILLARY - Abnormal; Notable for the following components:   Glucose-Capillary 143 (*)    All other components within normal limits  CULTURE, BLOOD (ROUTINE X 2)  CULTURE, BLOOD (ROUTINE X 2)  RESP PANEL BY RT-PCR (FLU A&B, COVID) ARPGX2  MRSA PCR SCREENING  PROTIME-INR  HIV ANTIBODY (ROUTINE TESTING W REFLEX)  HEMOGLOBIN A1C  TSH  PROTIME-INR  CORTISOL-AM, BLOOD  PROCALCITONIN  BASIC METABOLIC PANEL    PROCEDURES  Procedure(s) performed (including Critical Care):  .Critical Care Performed by: ,  K, MD Authorized by: ,  K, MD   Critical care provider statement:    Critical care time (minutes):  37   Critical care time was exclusive of:  Separately billable procedures and treating other patients   Critical care was necessary to treat or prevent imminent or life-threatening deterioration of the following conditions:  Sepsis   Critical care was   time spent personally by me on the following activities:  Discussions with consultants, evaluation of patient's response to treatment, examination of patient, ordering and performing treatments and interventions, ordering and review of laboratory studies, ordering and review of radiographic studies, pulse oximetry, re-evaluation of patient's condition, obtaining history from patient or surrogate and review of old charts   I assumed direction of critical care for this patient from another provider in my specialty: no     Care discussed with: admitting provider   .1-3 Lead EKG Interpretation Performed by: ,  K, MD Authorized by: ,  K, MD     Interpretation: normal     ECG rate:  69   ECG rate assessment: normal     Rhythm: sinus rhythm     Ectopy: none     Conduction: normal        ____________________________________________   INITIAL IMPRESSION / ASSESSMENT AND PLAN / ED COURSE  As part of my medical decision making, I reviewed the following data within the electronic medical record:  Nursing notes reviewed and incorporated, Labs reviewed, EKG interpreted, Old chart reviewed, Radiograph reviewed and Notes from prior ED visits reviewed and incorporated        Patient is a 49-year-old female with a stated past medical history of recurrent ESBL UTIs who presents after 5 days of attempted treatment of diagnosed UTI with worsening symptoms today. Differential diagnosis includes but is not limited to: Sepsis, UTI, pyelonephritis, pneumonia  Patient initially hypotensive with systolics in the 80s that improved upon fluid administration.  Patient's capillary refill never less than 2 seconds.  And remained less than 2 seconds after reassessment Patient's lactic acid difficult to reduce given patient had extreme difficulty with vascular accessIncluding multiple blown peripheral IVs and a failed attempt by the IV team.  Consult placed for PICC placement  Patient treated empirically with Zosyn.  Upon review of patient's last 3 urine cultures and susceptibilities, they have been susceptible to Zosyn.  Patient also found incidentally to be hyperkalemic without any significant EKG changes.  Patient's kidney function is at baseline for her  Dispo: Admit to medicine      ____________________________________________   FINAL CLINICAL IMPRESSION(S) / ED DIAGNOSES  Final diagnoses:  Urinary tract infection with hematuria, site unspecified  Sepsis, due to unspecified organism, unspecified whether acute organ dysfunction present (HCC)     ED Discharge Orders    None       Note:  This document was prepared using Dragon voice recognition software and may include unintentional dictation errors.   ,  K, MD 04/24/20 0717  

## 2020-04-23 NOTE — Sepsis Progress Note (Signed)
Notified bedside nurse of need to administer second fluid bolus.

## 2020-04-23 NOTE — Consult Note (Signed)
Pharmacy Antibiotic Note  [name] is a 50 y.o. female admitted on 04/23/2020 with Sepsis with h/o UTI from ESBL Ecoli. Pt presents with  Pharmacy has been consulted for Merrem dosing.  Plan: Merrem 1g q12h to start at next dosing interval from zosyn.    Height: 5\' 3"  (160 cm) Weight: 65.8 kg (145 lb) IBW/kg (Calculated) : 52.4  Temp (24hrs), Avg:98 F (36.7 C), Min:98 F (36.7 C), Max:98 F (36.7 C)  Recent Labs  Lab 04/18/20 1401 04/23/20 1145 04/23/20 1342 04/23/20 1555  WBC 12.4* 9.1  --   --   CREATININE 1.79* 1.71*  --  1.67*  LATICACIDVEN  --  3.9* 3.4*  --     Estimated Creatinine Clearance: 36.4 mL/min (A) (by C-G formula based on SCr of 1.67 mg/dL (H)).    Allergies  Allergen Reactions  . Lipitor [Atorvastatin] Other (See Comments)    Leg pains  . Tramadol Other (See Comments)    Mouth feels like it's on fire    Antimicrobials this admission: Ongoing    Merrem 3/22 2100 >>  Completed   Zosyn x1 ED (3/22 1300)  Dose adjustments this admission: Will CTM renal fxn  Microbiology results: 3/22 BCx: sent/pending  Thank you for allowing pharmacy to be a part of this patient's care.  Lorna Dibble 04/23/2020 5:16 PM

## 2020-04-23 NOTE — ED Notes (Signed)
1 PIV attempt in triage, and 2 attempts by multiple RNs in ED02A unsuccessful. MD Cheri Fowler made aware at this time.

## 2020-04-23 NOTE — ED Notes (Signed)
Called pharmacy to confirm IV insulin was being sent. Per Manuela Schwartz in pharmacy, will send as soon as possible.

## 2020-04-23 NOTE — ED Notes (Signed)
Per verbal order from MD Agbata, pt can have regular diet and to decrease ordered insulin from 10 units to 5 units due to pt BMP results.

## 2020-04-24 DIAGNOSIS — N390 Urinary tract infection, site not specified: Secondary | ICD-10-CM | POA: Diagnosis not present

## 2020-04-24 DIAGNOSIS — A419 Sepsis, unspecified organism: Secondary | ICD-10-CM | POA: Diagnosis not present

## 2020-04-24 LAB — BASIC METABOLIC PANEL
Anion gap: 6 (ref 5–15)
BUN: 24 mg/dL — ABNORMAL HIGH (ref 6–20)
CO2: 22 mmol/L (ref 22–32)
Calcium: 7.9 mg/dL — ABNORMAL LOW (ref 8.9–10.3)
Chloride: 114 mmol/L — ABNORMAL HIGH (ref 98–111)
Creatinine, Ser: 1.68 mg/dL — ABNORMAL HIGH (ref 0.44–1.00)
GFR, Estimated: 37 mL/min — ABNORMAL LOW (ref >60)
Glucose, Bld: 46 mg/dL — ABNORMAL LOW (ref 70–99)
Potassium: 5.1 mmol/L (ref 3.5–5.1)
Sodium: 142 mmol/L (ref 135–145)

## 2020-04-24 LAB — CBC
HCT: 28.2 % — ABNORMAL LOW (ref 36.0–46.0)
Hemoglobin: 8.8 g/dL — ABNORMAL LOW (ref 12.0–15.0)
MCH: 25.5 pg — ABNORMAL LOW (ref 26.0–34.0)
MCHC: 31.2 g/dL (ref 30.0–36.0)
MCV: 81.7 fL (ref 80.0–100.0)
Platelets: 180 10*3/uL (ref 150–400)
RBC: 3.45 MIL/uL — ABNORMAL LOW (ref 3.87–5.11)
RDW: 15.1 % (ref 11.5–15.5)
WBC: 5.8 10*3/uL (ref 4.0–10.5)
nRBC: 0.3 % — ABNORMAL HIGH (ref 0.0–0.2)

## 2020-04-24 LAB — GLUCOSE, CAPILLARY
Glucose-Capillary: 40 mg/dL — CL (ref 70–99)
Glucose-Capillary: 45 mg/dL — ABNORMAL LOW (ref 70–99)
Glucose-Capillary: 150 mg/dL — ABNORMAL HIGH (ref 70–99)
Glucose-Capillary: 66 mg/dL — ABNORMAL LOW (ref 70–99)
Glucose-Capillary: 153 mg/dL — ABNORMAL HIGH (ref 70–99)
Glucose-Capillary: 64 mg/dL — ABNORMAL LOW (ref 70–99)
Glucose-Capillary: 86 mg/dL (ref 70–99)
Glucose-Capillary: 81 mg/dL (ref 70–99)

## 2020-04-24 LAB — ECHOCARDIOGRAM COMPLETE
Area-P 1/2: 2.03 cm2
Height: 63 in
S' Lateral: 3.43 cm
Weight: 2320 oz

## 2020-04-24 LAB — CORTISOL-AM, BLOOD: Cortisol - AM: 9 ug/dL (ref 6.7–22.6)

## 2020-04-24 LAB — PROCALCITONIN: Procalcitonin: <0.1 ng/mL

## 2020-04-24 LAB — PROTIME-INR
INR: 1.2 (ref 0.8–1.2)
Prothrombin Time: 14.9 seconds (ref 11.4–15.2)

## 2020-04-24 LAB — MRSA PCR SCREENING: MRSA by PCR: NEGATIVE

## 2020-04-24 LAB — HIV ANTIBODY (ROUTINE TESTING W REFLEX): HIV Screen 4th Generation wRfx: NONREACTIVE

## 2020-04-24 MED ORDER — FAMOTIDINE 20 MG PO TABS
20.0000 mg | ORAL_TABLET | Freq: Every day | ORAL | Status: DC
  Administered 2020-04-25: 20 mg via ORAL
  Filled 2020-04-24: qty 1

## 2020-04-24 MED ORDER — INSULIN ASPART 100 UNIT/ML ~~LOC~~ SOLN: 0.0000 [IU] | Freq: Every day | SUBCUTANEOUS | Status: DC

## 2020-04-24 MED ORDER — CARVEDILOL 25 MG PO TABS
25.0000 mg | ORAL_TABLET | Freq: Two times a day (BID) | ORAL | Status: DC
Start: 2020-04-24 — End: 2020-04-25
  Administered 2020-04-24 – 2020-04-25 (×2): 25 mg via ORAL
  Filled 2020-04-24 (×2): qty 1

## 2020-04-24 MED ORDER — DEXTROSE 50 % IV SOLN
25.0000 g | INTRAVENOUS | Status: AC
  Administered 2020-04-24: 25 g via INTRAVENOUS
  Filled 2020-04-24: qty 50

## 2020-04-24 MED ORDER — AMLODIPINE BESYLATE 5 MG PO TABS
2.5000 mg | ORAL_TABLET | Freq: Every day | ORAL | Status: DC
  Administered 2020-04-24 – 2020-04-25 (×2): 2.5 mg via ORAL
  Filled 2020-04-24 (×2): qty 1

## 2020-04-24 MED ORDER — LOPERAMIDE HCL 2 MG PO CAPS
2.0000 mg | ORAL_CAPSULE | Freq: Three times a day (TID) | ORAL | Status: DC | PRN
  Administered 2020-04-24: 2 mg via ORAL
  Filled 2020-04-24: qty 1

## 2020-04-24 MED ORDER — LOPERAMIDE HCL 2 MG PO CAPS
4.0000 mg | ORAL_CAPSULE | Freq: Three times a day (TID) | ORAL | Status: DC | PRN
  Administered 2020-04-24: 4 mg via ORAL
  Filled 2020-04-24: qty 2

## 2020-04-24 MED ORDER — INSULIN ASPART 100 UNIT/ML ~~LOC~~ SOLN: 0.0000 [IU] | Freq: Three times a day (TID) | SUBCUTANEOUS | Status: DC

## 2020-04-24 NOTE — Progress Notes (Signed)
Hypoglycemic Event  CBG: 40  Treatment: D50 50 mL (25 gm)  Symptoms: None  Follow-up CBG: [id number] CBG Result:150  Possible Reasons for Event: Unknown  Comments/MD notified:Patel, MD    Rometta Emery

## 2020-04-24 NOTE — Progress Notes (Signed)
[facility] NAME: [name]    MR#:  [id number]  DATE OF BIRTH:  May 15, 1970  SUBJECTIVE:  feels a lot better however started having diarrhea which is chronic for patient. Overall feels better. Tolerating PO regular diet. Did have was a low sugar this morning however asymptomatic.  REVIEW OF SYSTEMS:   Review of Systems  Constitutional: Negative for chills, fever and weight loss.  HENT: Negative for ear discharge, ear pain and nosebleeds.   Eyes: Negative for blurred vision, pain and discharge.  Respiratory: Negative for sputum production, shortness of breath, wheezing and stridor.   Cardiovascular: Negative for chest pain, palpitations, orthopnea and PND.  Gastrointestinal: Negative for abdominal pain, diarrhea, nausea and vomiting.  Genitourinary: Negative for frequency and urgency.  Musculoskeletal: Negative for back pain and joint pain.  Neurological: Positive for weakness. Negative for sensory change, speech change and focal weakness.  Psychiatric/Behavioral: Negative for depression and hallucinations. The patient is not nervous/anxious.    Tolerating Diet: Tolerating PT:   DRUG ALLERGIES:   Allergies  Allergen Reactions  . Lipitor [Atorvastatin] Other (See Comments)    Leg pains  . Tramadol Other (See Comments)    Mouth feels like it's on fire    VITALS:  Blood pressure (!) 155/79, pulse 79, temperature 98.6 F (37 C), temperature source Oral, resp. rate 16, height 5\' 3"  (1.6 m), weight 65.5 kg, SpO2 99 %.  PHYSICAL EXAMINATION:   Physical Exam  GENERAL:  50 y.o.-year-old patient lying in the bed with no acute distress.  HLUNGS: Normal breath sounds bilaterally, no wheezing, rales, rhonchi. No use of accessory muscles of respiration.  CARDIOVASCULAR: S1, S2 normal. No murmurs, rubs, or gallops.  ABDOMEN: Soft, nontender, nondistended. Bowel sounds present. No organomegaly or mass.  EXTREMITIES: No cyanosis,  clubbing or edema b/l.    NEUROLOGIC: Cranial nerves II through XII are intact. No focal Motor or sensory deficits b/l.   PSYCHIATRIC:  patient is alert and oriented x 3.  SKIN: No obvious rash, lesion, or ulcer.   LABORATORY PANEL:  CBC Recent Labs  Lab 04/24/20 0639  WBC 5.8  HGB 8.8*  HCT 28.2*  PLT 180    Chemistries  Recent Labs  Lab 04/23/20 1145 04/23/20 1555 04/24/20 0639  NA 137   < > 142  K 6.1*   < > 5.1  CL 107   < > 114*  CO2 22   < > 22  GLUCOSE 178*   < > 46*  BUN 26*   < > 24*  CREATININE 1.71*   < > 1.68*  CALCIUM 8.5*   < > 7.9*  AST 23  --   --   ALT 23  --   --   ALKPHOS 142*  --   --   BILITOT 0.5  --   --    < > = values in this interval not displayed.   Cardiac Enzymes No results for input(s): TROPONINI in the last 168 hours. RADIOLOGY:  ECHOCARDIOGRAM COMPLETE  Result Date: 04/24/2020    ECHOCARDIOGRAM REPORT   Patient Name:   [name] Date of Exam: 04/23/2020 Medical Rec #:  [id number]             Height:       63.0 in Accession #:    [id number]            Weight:       145.0 lb  Date of Birth:  1970/05/07             BSA:          1.687 m Patient Age:    50 years              BP:           118/58 mmHg Patient Gender: F                     HR:           68 bpm. Exam Location:  [facility] Procedure: 2D Echo, Cardiac Doppler and Color Doppler Indications:     I48.91 Atrial Fibrillation  History:         Patient has prior history of Echocardiogram examinations, most                  recent 11/06/2019. STENT; Risk Factors:Hypertension and                  Diabetes. Stroke. Myocardial infarction. Coronary artery                  disease.  Sonographer:     Wilford Sports Rodgers-Jones Referring Phys:  [id number] AGBATA Diagnosing Phys: Kate Sable MD IMPRESSIONS  1. Left ventricular ejection fraction, by estimation, is 60 to 65%. The left ventricle has normal function. The left ventricle has no regional wall motion abnormalities. Left ventricular  diastolic parameters are consistent with Grade I diastolic dysfunction (impaired relaxation).  2. Right ventricular systolic function is normal. The right ventricular size is normal.  3. The mitral valve is normal in structure. No evidence of mitral valve regurgitation. No evidence of mitral stenosis.  4. The aortic valve is tricuspid. Aortic valve regurgitation is not visualized. Mild to moderate aortic valve sclerosis/calcification is present, without any evidence of aortic stenosis. FINDINGS  Left Ventricle: Left ventricular ejection fraction, by estimation, is 60 to 65%. The left ventricle has normal function. The left ventricle has no regional wall motion abnormalities. The left ventricular internal cavity size was normal in size. There is  no left ventricular hypertrophy. Left ventricular diastolic parameters are consistent with Grade I diastolic dysfunction (impaired relaxation). Right Ventricle: The right ventricular size is normal. No increase in right ventricular wall thickness. Right ventricular systolic function is normal. Left Atrium: Left atrial size was normal in size. Right Atrium: Right atrial size was normal in size. Pericardium: There is no evidence of pericardial effusion. Mitral Valve: The mitral valve is normal in structure. No evidence of mitral valve regurgitation. No evidence of mitral valve stenosis. Tricuspid Valve: The tricuspid valve is normal in structure. Tricuspid valve regurgitation is not demonstrated. No evidence of tricuspid stenosis. Aortic Valve: The aortic valve is tricuspid. Aortic valve regurgitation is not visualized. Mild to moderate aortic valve sclerosis/calcification is present, without any evidence of aortic stenosis. Pulmonic Valve: The pulmonic valve was normal in structure. Pulmonic valve regurgitation is not visualized. No evidence of pulmonic stenosis. Aorta: The aortic root is normal in size and structure. Venous: The inferior vena cava was not well visualized.  IAS/Shunts: No atrial level shunt detected by color flow Doppler.  LEFT VENTRICLE PLAX 2D LVIDd:         5.11 cm  Diastology LVIDs:         3.43 cm  LV e' medial:    6.96 cm/s LV PW:         0.93 cm  LV E/e' medial:  9.5 LV IVS:        0.84 cm  LV e' lateral:   9.14 cm/s LVOT diam:     2.30 cm  LV E/e' lateral: 7.2 LV SV:         63 LV SV Index:   37 LVOT Area:     4.15 cm  RIGHT VENTRICLE RV Basal diam:  2.93 cm RV S prime:     14.10 cm/s TAPSE (M-mode): 2.9 cm LEFT ATRIUM             Index       RIGHT ATRIUM          Index LA diam:        4.00 cm 2.37 cm/m  RA Area:     9.60 cm LA Vol (A2C):   36.9 ml 21.88 ml/m RA Volume:   19.50 ml 11.56 ml/m LA Vol (A4C):   28.9 ml 17.13 ml/m LA Biplane Vol: 33.9 ml 20.10 ml/m  AORTIC VALVE LVOT Vmax:   63.10 cm/s LVOT Vmean:  46.900 cm/s LVOT VTI:    0.152 m  AORTA Ao Root diam: 3.40 cm MITRAL VALVE MV Area (PHT): 2.03 cm    SHUNTS MV Decel Time: 373 msec    Systemic VTI:  0.15 m MV E velocity: 66.20 cm/s  Systemic Diam: 2.30 cm MV A velocity: 90.80 cm/s MV E/A ratio:  0.73 Kate Sable MD Electronically signed by Kate Sable MD Signature Date/Time: 04/24/2020/2:08:45 PM    Final    [place] EKG SITE RITE  Result Date: 04/23/2020 If Site Rite image not attached, placement could not be confirmed due to current cardiac rhythm.  ASSESSMENT AND PLAN:  Dottie Vaquerano [name] is a 50 y.o. female with medical history significant for recurrent UTI, chronic kidney disease stage III, iron deficiency anemia, coronary artery disease, hypertension, history of CVA who presents to the ER for evaluation of abdominal pain mostly suprapubic area associated with dysuria and frequency of urination. Patient also complains of intermittent fever with T-max of 102 F  Sepsis secondary to UTI (POA)  --As evidenced by hypotension that responded to IV fluid resuscitation, elevated lactic acid and pyuria --improved --Urine culture from [birth year]/19/50 year old multidrug-resistant E.  Coli --recieved aggressive IV fluid resuscitation--d/c now --on meropenem adjusted to renal function -- will change to nitrofurantoin at discharge. Patient is hemodynamically much improved. -- DC IV fluids  Diabetes mellitus with complications of stage III chronic kidney disease Hypoglycemia-- asymptomatic --Maintain consistent carbohydrate diet --Renal function is stable Glycemic control with sliding scale insulin --a1c is 5.6%  Hyperkalemia --Most likely secondary to RTA type IV --treat with dextrose, insulin and Lokelma --potassium 5.1 (6.1)  New onset atrial fibrillation--transient in the setting of sepsis now resolved --Patient is rate controlled --Patient has a CHA2DS2-VASc score 6 and ideally requires anticoagulation as secondary prophylaxis for an acute stroke --Patient is not a candidate for long-term anticoagulation due to history of recurrent GI bleeding requiring blood transfusion --3/23--pt is NSR on tele HR 70-80  Hypothyroidism --Continue Synthroid  History of CVA -- Plavix and statins  Anxiety and depression --Continue Effexor, bupropion, trazodone and alprazolam  History of coronary artery disease --Continue Plavix and statins --resume coreg, amlodipine and PRN nitrates  History of chronic diarrhea PRN lomotil    DVT prophylaxis: Lovenox Code Status: full code Family Communication: none todayDisposition Plan: Back to previous home environment Consults called: none Status: At the time of admission, it appears that the appropriate admission status for this patient  is inpatient  Level of care: Progressive Cardiac Status is: Inpatient  Remains inpatient appropriate because:IV treatments appropriate due to intensity of illness or inability to take PO   Dispo: The patient is from: Home              Anticipated d/c is to: Home              Patient currently is not medically stable to d/c.   Difficult to place patient No  Patient is  getting treatment for sepsis secondary to UTI. She is overall improving. Will continue to monitor for one more day and change to oral antibiotics from tomorrow and hopefully discharge if she remains hemodynamically stable.      TOTAL TIME TAKING CARE OF THIS PATIENT: 25 minutes.  >50% time spent on counselling and coordination of care  Note: This dictation was prepared with Dragon dictation along with smaller phrase technology. Any transcriptional errors that result from this process are unintentional.  Fritzi Mandes M.D    [facility]   CC: Primary care physician; [name], MDPatient ID: [name], female   DOB: February 16, 1970, 50 y.o.   MRN: [id number]

## 2020-04-24 NOTE — Progress Notes (Signed)
Last Resulted: 04/23/20 11:36 E coli in urine culture - multi resistant organisms, patient was on Doxycycline, she is now in hospital for sepsis. Urine is susceptible to Nitrofurantoin only all others are IV medications.

## 2020-04-24 NOTE — Progress Notes (Signed)
Inpatient Diabetes Program Recommendations  AACE/ADA: New Consensus Statement on Inpatient Glycemic Control (2015)  Target Ranges:  Prepandial:   less than 140 mg/dL      Peak postprandial:   less than 180 mg/dL (1-2 hours)      Critically ill patients:  140 - 180 mg/dL   Lab Results  Component Value Date   GLUCAP 66 (L) 04/24/2020   HGBA1C 5.2 04/23/2020    Review of Glycemic Control Results for ST KEAH, LAMBA (MRN [id number]) as of 04/24/2020 12:04  Ref. Range 04/23/2020 22:42 04/24/2020 08:07 04/24/2020 08:27 04/24/2020 08:54 04/24/2020 11:51  Glucose-Capillary Latest Ref Range: 70 - 99 mg/dL 143 (H) 40 (LL) 45 (L) 150 (H) 66 (L)   Diabetes history: DM Outpatient Diabetes medications: None Current orders for Inpatient glycemic control:  Novolog moderate tid with meals  Inpatient Diabetes Program Recommendations:    Note low blood sugars.  It does not appear patient was on diabetes medications prior to admit and A1C=5.2%.  May consider reduction of Novolog correction to "Very Sensitive" (0-6 units) or hold Novolog for now unless CBG's >180 mg/dL.   Thanks  Adah Perl, RN, BC-ADM Inpatient Diabetes Coordinator Pager [phone] (8a-5p)

## 2020-04-25 DIAGNOSIS — A419 Sepsis, unspecified organism: Secondary | ICD-10-CM | POA: Diagnosis not present

## 2020-04-25 DIAGNOSIS — N390 Urinary tract infection, site not specified: Secondary | ICD-10-CM | POA: Diagnosis not present

## 2020-04-25 LAB — GLUCOSE, CAPILLARY
Glucose-Capillary: 151 mg/dL — ABNORMAL HIGH (ref 70–99)
Glucose-Capillary: 45 mg/dL — ABNORMAL LOW (ref 70–99)
Glucose-Capillary: 56 mg/dL — ABNORMAL LOW (ref 70–99)
Glucose-Capillary: 74 mg/dL (ref 70–99)
Glucose-Capillary: 94 mg/dL (ref 70–99)

## 2020-04-25 MED ORDER — NITROFURANTOIN MONOHYD MACRO 100 MG PO CAPS: 100.0000 mg | ORAL_CAPSULE | Freq: Two times a day (BID) | ORAL | 0 refills | Status: AC

## 2020-04-25 MED ORDER — DEXTROSE 10 % IV SOLN: INTRAVENOUS | Status: DC

## 2020-04-25 MED ORDER — METRONIDAZOLE 0.75 % VA GEL: 1.0000 | Freq: Every day | VAGINAL | 0 refills | Status: AC

## 2020-04-25 MED ORDER — TERCONAZOLE 0.4 % VA CREA: 1.0000 | TOPICAL_CREAM | Freq: Every day | VAGINAL | 0 refills | Status: AC

## 2020-04-25 MED ORDER — NITROFURANTOIN MONOHYD MACRO 100 MG PO CAPS
100.0000 mg | ORAL_CAPSULE | Freq: Two times a day (BID) | ORAL | Status: DC
  Filled 2020-04-25: qty 1

## 2020-04-25 MED ORDER — DEXTROSE 50 % IV SOLN
INTRAVENOUS | Status: AC
  Administered 2020-04-25: 50 mL
  Filled 2020-04-25: qty 50

## 2020-04-25 MED ORDER — DEXTROSE 50 % IV SOLN: 25.0000 g | INTRAVENOUS | Status: AC

## 2020-04-25 MED ORDER — GLUCOSE 4 G PO CHEW
1.0000 | CHEWABLE_TABLET | Freq: Three times a day (TID) | ORAL | Status: DC
  Administered 2020-04-25: 4 g via ORAL
  Filled 2020-04-25: qty 1

## 2020-04-25 MED ORDER — GLUCOSE 4 G PO CHEW: 1.0000 | CHEWABLE_TABLET | Freq: Three times a day (TID) | ORAL | 12 refills | Status: DC

## 2020-04-25 NOTE — Progress Notes (Signed)
Hypoglycemic Event  CBG: 45  Treatment: D50 25 mL (12.5 gm)  Symptoms: None  Follow-up CBG: [id number] CBG Result:151  Possible Reasons for Event: Unknown  Comments/MD notified: [name] NP    Christophe Louis

## 2020-04-25 NOTE — Discharge Instructions (Signed)
Take snacks in between meals and at bedtime

## 2020-04-25 NOTE — Discharge Summary (Addendum)
[facility] NAME: [name]    MR#:  [id number]  DATE OF BIRTH:  [birth year]  DATE OF ADMISSION:  04/23/2020 ADMITTING PHYSICIAN: Collier Bullock, MD  DATE OF DISCHARGE: 04/25/2020  PRIMARY CARE PHYSICIAN: [name], MD    ADMISSION DIAGNOSIS:  Sepsis ([place]) [A41.9] Urinary tract infection with hematuria, site unspecified [N39.0, R31.9] Sepsis, due to unspecified organism, unspecified whether acute organ dysfunction present ([place]) [A41.9]  DISCHARGE DIAGNOSIS:  Severe Sepsis due to ESBL ecoli UTI--POA--resolved  SECONDARY DIAGNOSIS:   Past Medical History:  Diagnosis Date  . Acute colitis 01/27/2017  . Acute pyelonephritis   . Anemia    iron deficiency anemia  . Aortic arch aneurysm ([place])   . BRCA negative 2014  . CAD (coronary artery disease)    a. 08/2003 Cath: LAD 30-40-med Rx; b. 11/2014 PCI: LAD 70m(3.25x23 Xience Alpine DES); c. 06/2015 PCI: D1 (2.25x12 Resolute Integrity DES); d. 06/2017 PCI: Patent mLAD stent, D2 95 (PTCA); e. 09/2017 PCI: D2 99ost (CBA); d. 12/2017 Cath: LM nl, LAD 39m80d (small), D1 40ost, D2 95ost, LCX 40p, RCA 40ost/p->Med rx for D2 given restenosis.  . Colitis 06/03/2015  . Colon polyp   . CVA (cerebral vascular accident) ([facility]   Left side weakness.   . Degenerative tear of glenoid labrum of right shoulder 03/15/2017  . Diabetes mellitus without complication ([facility]  . Family history of breast cancer    BRCA neg 2014  . Femur fracture, left ([facility]9/10/2018  . Gastric ulcer 04/27/2011  . History of echocardiogram    a. 03/2017 Echo: EF 60-65%, no rwma; b. 02/2018 Echo: EF 60-65%, no rwma. Nl RV fxn. No cardiac source of emboli (admitted w/ stroke).  . Hypertension   . Malignant melanoma of skin of scalp ([facility]  . MI, acute, non ST segment elevation ([facility]  . Neuromuscular disorder ([facility]  . S/P drug eluting coronary stent placement 06/04/2015  . Sepsis ([facility]/14/2019  . [facility] ([facility]    a. 02/2018 MRI: 67m59mate acute/early subacute L medial frontal lobe inarct; b. 02/2018 MRA No large vessel occlusion or aneurysm. Mod to sev L P2 stenosis. thready L vertebral artery, diffusely dzs'd; c. 02/2018 Carotid U/S: <50% bilat ICA dzs.    HOSPITAL COURSE:   Daw[place]ita[age]o.femalewith medical history significant forrecurrent UTI, chronic kidney disease stage III, iron deficiency anemia, coronary artery disease, hypertension, history of CVA who presents to the ER for evaluation of abdominal pain mostly suprapubic area associated with dysuria and frequency of urination. Patient also complains of intermittent fever with T-max of 102 F  Severe Sepsis secondary to UTI(POA)  --As evidenced by hypotension that responded to IV fluid resuscitation, elevated lactic acid and pyuria --improved --Urine culture from03/17/50 year old multidrug-resistant E. Coli --recieved aggressive IV fluid resuscitation--d/c now --on meropenem adjusted to renal function--change to po NFT bid (total 7 days) -- will get her appt with urology given her recurrent UTI.  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 to RTA type IV --treat with dextrose, insulin and Lokelma --potassium 5.1 (6.1)  New onset atrial fibrillation--transient in the setting of sepsis now resolved --Patient is rate controlled --Patient has aCHA2DS2-VASc score 6 and ideally requires anticoagulation as  secondary prophylaxis for an acute stroke --Patient is not a candidate for long-term anticoagulation due to history of recurrent GI bleeding requiring blood transfusion --3/23--pt is NSR on tele HR 70-80  Hypothyroidism --Continue Synthroid  History of CVA -- Plavix and  statins  Anxiety and depression --Continue Effexor, bupropion,trazodone and alprazolam  History of coronary artery disease --Continue Plavix and statins --resume coreg, amlodipine and PRN nitrates  History of chronic diarrhea PRN lomotil    DVT prophylaxis:Lovenox Code Status:full code Family Communication:none todayDisposition Plan:Back to previous home environment Consults called:none Status:At the time of admission, it appears that the appropriate admission status for this patient isinpatient  Level of care: Progressive Cardiac Status is: Inpatient   Dispo: The patient is from: Home  Anticipated d/c is to: Home  Patient currently is medically stable to d/c.              Difficult to place patient No d/c home Pt agreeable CONSULTS OBTAINED:    DRUG ALLERGIES:   Allergies  Allergen Reactions  . Lipitor [Atorvastatin] Other (See Comments)    Leg pains  . Tramadol Other (See Comments)    Mouth feels like it's on fire    DISCHARGE MEDICATIONS:   Allergies as of 04/25/2020      Reactions   Lipitor [atorvastatin] Other (See Comments)   Leg pains   Tramadol Other (See Comments)   Mouth feels like it's on fire      Medication List    TAKE these medications   Ajovy 225 MG/1.5ML Soaj Generic drug: Fremanezumab-vfrm Inject into the skin.   alosetron 1 MG tablet Commonly known as: Lotronex Take 1 tablet (1 mg total) by mouth 2 (two) times daily.   ALPRAZolam 1 MG tablet Commonly known as: XANAX Take 1 tablet by mouth twice daily as needed What changed:  how much to take when to take this   amLODipine 2.5 MG tablet Commonly known as: NORVASC Take 1 tablet (2.5 mg total) by mouth daily.   buPROPion 200 MG 12 hr tablet Commonly known as: WELLBUTRIN SR Take 200 mg by mouth every morning.   carvedilol 25 MG tablet Commonly known as: COREG Take 1 tablet (25 mg total) by mouth 2 (two) times daily with a meal.    clopidogrel 75 MG tablet Commonly known as: PLAVIX Take 75 mg by mouth daily.   desvenlafaxine 50 MG 24 hr tablet Commonly known as: PRISTIQ Take 50 mg by mouth daily.   diphenoxylate-atropine 2.5-0.025 MG tablet Commonly known as: Lomotil Take 1 tablet by mouth 4 (four) times daily as needed for diarrhea or loose stools.   Emgality 120 MG/ML Soaj Generic drug: Galcanezumab-gnlm Inject 120 mg into the skin every 28 (twenty-eight) days.   Euthyrox 25 MCG tablet Generic drug: levothyroxine TAKE 1 TABLET BY MOUTH ONCE DAILY BEFORE BREAKFAST   famotidine 20 MG tablet Commonly known as: PEPCID Take 20 mg by mouth 2 (two) times daily.   gabapentin 300 MG capsule Commonly known as: NEURONTIN Take 2 capsules (600 mg total) by mouth 2 (two) times daily.   glucose 4 GM chewable tablet Chew 1 tablet (4 g total) by mouth 3 (three) times daily.   isosorbide mononitrate 120 MG 24 hr tablet Commonly known as: IMDUR Take 1 tablet (120 mg total) by mouth daily.   metroNIDAZOLE 0.75 % vaginal gel Commonly known as: METROGEL VAGINAL Place 1 Applicatorful vaginally daily for 5 days. Use this the second week.   nitrofurantoin (macrocrystal-monohydrate) 100 MG capsule Commonly  known as: MACROBID Take 1 capsule (100 mg total) by mouth every 12 (twelve) hours for 10 doses.   nitroGLYCERIN 0.4 MG SL tablet Commonly known as: NITROSTAT Place 1 tablet (0.4 mg total) under the tongue every 5 (five) minutes as needed for chest pain.   promethazine 25 MG tablet Commonly known as: PHENERGAN TAKE 1 TABLET BY MOUTH EVERY 6 HOURS AS NEEDED FOR NAUSEA FOR VOMITING   rosuvastatin 40 MG tablet Commonly known as: CRESTOR Take 1 tablet (40 mg total) by mouth daily at 6 PM.   SYSTANE OP Place 1 drop into both eyes daily as needed (dry eyes).   terconazole 0.4 % vaginal cream Commonly known as: Terazol 7 Place 1 applicator vaginally at bedtime for 5 days. Use this the first week.   traZODone  100 MG tablet Commonly known as: DESYREL TAKE 1 TO 2 TABLETS BY MOUTH AT BEDTIME What changed: how much to take       If you experience worsening of your admission symptoms, develop shortness of breath, life threatening emergency, suicidal or homicidal thoughts you must seek medical attention immediately by calling 911 or calling your MD immediately  if symptoms less severe.  You Must read complete instructions/literature along with all the possible adverse reactions/side effects for all the Medicines you take and that have been prescribed to you. Take any new Medicines after you have completely understood and accept all the possible adverse reactions/side effects.   Please note  You were cared for by a hospitalist during your hospital stay. If you have any questions about your discharge medications or the care you received while you were in the hospital after you are discharged, you can call the unit and asked to speak with the hospitalist on call if the hospitalist that took care of you is not available. Once you are discharged, your primary care physician will handle any further medical issues. Please note that NO REFILLS for any discharge medications will be authorized once you are discharged, as it is imperative that you return to your primary care physician (or establish a relationship with a primary care physician if you do not have one) for your aftercare needs so that they can reassess your need for medications and monitor your lab values. Today   SUBJECTIVE   Sugar 45 this am, pt asymptomatic Eating well  VITAL SIGNS:  Blood pressure 137/69, pulse (!) 59, temperature 98.5 F (36.9 C), temperature source Oral, resp. rate 14, height _0  (1.6 m), weight 66.4 kg, SpO2 99 %.  I/O:  No intake or output data in the 24 hours ending 04/25/20 1032  PHYSICAL EXAMINATION:  GENERAL:  50 y.o.-year-old patient lying in the bed with no acute distress.  LUNGS: Normal breath sounds  bilaterally, no wheezing, rales,rhonchi or crepitation. No use of accessory muscles of respiration.  CARDIOVASCULAR: S1, S2 normal. No murmurs, rubs, or gallops.  ABDOMEN: Soft, non-tender, non-distended. Bowel sounds present. No organomegaly or mass.  EXTREMITIES: No pedal edema, cyanosis, or clubbing.  NEUROLOGIC: non-focal PSYCHIATRIC: The patient is alert and oriented x 3.  SKIN: No obvious rash, lesion, or ulcer.   DATA REVIEW:   CBC  Recent Labs  Lab 04/24/20 0639  WBC 5.8  HGB 8.8*  HCT 28.2*  PLT 180    Chemistries  Recent Labs  Lab 04/23/20 1145 04/23/20 1555 04/24/20 0639  NA 137   < > 142  K 6.1*   < > 5.1  CL 107   < > 114*  CO2 22   < > 22  GLUCOSE 178*   < > 46*  BUN 26*   < > 24*  CREATININE 1.71*   < > 1.68*  CALCIUM 8.5*   < > 7.9*  AST 23  --   --   ALT 23  --   --   ALKPHOS 142*  --   --   BILITOT 0.5  --   --    < > = values in this interval not displayed.    Microbiology Results   Recent Results (from the past 240 hour(s))  Urine Culture     Status: Abnormal   Collection Time: 04/18/20 12:00 AM   Specimen: Urine   Urine  Result Value Ref Range Status   Urine Culture, Routine Final report (A)  Final   Organism ID, Bacteria Comment (A)  Final    Comment: Escherichia coli, identified by an automated biochemical system. Multi-Drug Resistant Organism Greater than 100,000 colony forming units per mL Susceptibility profile is consistent with a probable ESBL.    Antimicrobial Susceptibility Comment  Final    Comment:       ** S = Susceptible; I = Intermediate; R = Resistant **                    P = Positive; N = Negative             MICS are expressed in micrograms per mL    Antibiotic                 RSLT#1    RSLT#2    RSLT#3    RSLT#4 Amoxicillin/Clavulanic Acid    R Ampicillin                     R Cefazolin                      R Cefepime                       S Ceftriaxone                    R Cefuroxime                      R Ciprofloxacin                  R Ertapenem                      S Gentamicin                     S Imipenem                       S Levofloxacin                   R Meropenem                      S Nitrofurantoin                 S Piperacillin/Tazobactam        S Tetracycline                   R Tobramycin  R Trimethoprim/Sulfa             R   Culture, blood (Routine x 2)     Status: None (Preliminary result)   Collection Time: 04/23/20 11:45 AM   Specimen: BLOOD  Result Value Ref Range Status   Specimen Description BLOOD LEFT HAND  Final   Special Requests   Final    BOTTLES DRAWN AEROBIC AND ANAEROBIC Blood Culture results may not be optimal due to an inadequate volume of blood received in culture bottles   Culture   Final    NO GROWTH 2 DAYS Performed at [facility], [address] [place], [place] [address]    Report Status PENDING  Incomplete  Culture, blood (Routine x 2)     Status: None (Preliminary result)   Collection Time: 04/23/20 11:46 AM   Specimen: BLOOD  Result Value Ref Range Status   Specimen Description BLOOD LEFT FOREARM  Final   Special Requests   Final    BOTTLES DRAWN AEROBIC AND ANAEROBIC Blood Culture results may not be optimal due to an inadequate volume of blood received in culture bottles   Culture   Final    NO GROWTH 2 DAYS Performed at [facility], [address], [place], [place] [address]    Report Status PENDING  Incomplete  Resp Panel by RT-PCR (Flu A&B, Covid) Nasopharyngeal Swab     Status: None   Collection Time: 04/23/20  1:42 PM   Specimen: Nasopharyngeal Swab; Nasopharyngeal(NP) swabs in vial transport medium  Result Value Ref Range Status   SARS Coronavirus 2 by RT PCR NEGATIVE NEGATIVE Final    Comment: (NOTE) SARS-CoV-2 target nucleic acids are NOT DETECTED.  The SARS-CoV-2 RNA is generally detectable in upper respiratory specimens during the acute phase of infection. The  lowest concentration of SARS-CoV-2 viral copies this assay can detect is 138 copies/mL. A negative result does not preclude SARS-Cov-2 infection and should not be used as the sole basis for treatment or other patient management decisions. A negative result may occur with  improper specimen collection/handling, submission of specimen other than nasopharyngeal swab, presence of viral mutation(s) within the areas targeted by this assay, and inadequate number of viral copies(<138 copies/mL). A negative result must be combined with clinical observations, patient history, and epidemiological information. The expected result is Negative.  Fact Sheet for Patients:  EntrepreneurPulse.com.au  Fact Sheet for Healthcare Providers:  IncredibleEmployment.be  This test is no t yet approved or cleared by the [place] FDA and  has been authorized for detection and/or diagnosis of SARS-CoV-2 by FDA under an Emergency Use Authorization (EUA). This EUA will remain  in effect (meaning this test can be used) for the duration of the COVID-19 declaration under Section 564(b)(1) of the Act, 21 U.S.C.section 360bbb-3(b)(1), unless the authorization is terminated  or revoked sooner.       Influenza A by PCR NEGATIVE NEGATIVE Final   Influenza B by PCR NEGATIVE NEGATIVE Final    Comment: (NOTE) The Xpert Xpress SARS-CoV-2/FLU/RSV plus assay is intended as an aid in the diagnosis of influenza from Nasopharyngeal swab specimens and should not be used as a sole basis for treatment. Nasal washings and aspirates are unacceptable for Xpert Xpress SARS-CoV-2/FLU/RSV testing.  Fact Sheet for Patients: EntrepreneurPulse.com.au  Fact Sheet for Healthcare Providers: IncredibleEmployment.be  This test is not yet approved or cleared by the [place] FDA and has been authorized for detection and/or diagnosis of SARS-CoV-2 by FDA under  an  Emergency Use Authorization (EUA). This EUA will remain in effect (meaning this test can be used) for the duration of the COVID-19 declaration under Section 564(b)(1) of the Act, 21 U.S.C. section 360bbb-3(b)(1), unless the authorization is terminated or revoked.  Performed at [facility], [address] [place], [place] [address]   MRSA PCR Screening     Status: None   Collection Time: 04/23/20 10:45 PM   Specimen: Nasopharyngeal  Result Value Ref Range Status   MRSA by PCR NEGATIVE NEGATIVE Final    Comment:        The GeneXpert MRSA Assay (FDA approved for NASAL specimens only), is one component of a comprehensive MRSA colonization surveillance program. It is not intended to diagnose MRSA infection nor to guide or monitor treatment for MRSA infections. Performed at [facility], [address] [place], [place] [address]     RADIOLOGY:  ECHOCARDIOGRAM COMPLETE  Result Date: 04/24/2020    ECHOCARDIOGRAM REPORT   Patient Name:   [place] Date of Exam: 04/23/2020 Medical Rec #:  [id number]             Height:       63.0 in Accession #:    [id number]            Weight:       145.0 lb Date of Birth:  02/05/1970             BSA:          1.687 m Patient Age:    3 years              BP:           118/58 mmHg Patient Gender: F                     HR:           68 bpm. Exam Location:  [facility] Procedure: 2D Echo, Cardiac Doppler and Color Doppler Indications:     I48.91 Atrial Fibrillation  History:         Patient has prior history of Echocardiogram examinations, most                  recent 11/06/2019. STENT; Risk Factors:Hypertension and                  Diabetes. Stroke. Myocardial infarction. Coronary artery                  disease.  Sonographer:     Wilford Sports Rodgers-Jones Referring Phys:  [id number] AGBATA Diagnosing Phys: Kate Sable MD IMPRESSIONS  1. Left ventricular ejection fraction, by estimation, is 60 to 65%. The left ventricle has  normal function. The left ventricle has no regional wall motion abnormalities. Left ventricular diastolic parameters are consistent with Grade I diastolic dysfunction (impaired relaxation).  2. Right ventricular systolic function is normal. The right ventricular size is normal.  3. The mitral valve is normal in structure. No evidence of mitral valve regurgitation. No evidence of mitral stenosis.  4. The aortic valve is tricuspid. Aortic valve regurgitation is not visualized. Mild to moderate aortic valve sclerosis/calcification is present, without any evidence of aortic stenosis. FINDINGS  Left Ventricle: Left ventricular ejection fraction, by estimation, is 60 to 65%. The left ventricle has normal function. The left ventricle has no regional wall motion abnormalities. The left ventricular internal cavity size was normal in size. There is  no left ventricular hypertrophy.  Left ventricular diastolic parameters are consistent with Grade I diastolic dysfunction (impaired relaxation). Right Ventricle: The right ventricular size is normal. No increase in right ventricular wall thickness. Right ventricular systolic function is normal. Left Atrium: Left atrial size was normal in size. Right Atrium: Right atrial size was normal in size. Pericardium: There is no evidence of pericardial effusion. Mitral Valve: The mitral valve is normal in structure. No evidence of mitral valve regurgitation. No evidence of mitral valve stenosis. Tricuspid Valve: The tricuspid valve is normal in structure. Tricuspid valve regurgitation is not demonstrated. No evidence of tricuspid stenosis. Aortic Valve: The aortic valve is tricuspid. Aortic valve regurgitation is not visualized. Mild to moderate aortic valve sclerosis/calcification is present, without any evidence of aortic stenosis. Pulmonic Valve: The pulmonic valve was normal in structure. Pulmonic valve regurgitation is not visualized. No evidence of pulmonic stenosis. Aorta: The aortic  root is normal in size and structure. Venous: The inferior vena cava was not well visualized. IAS/Shunts: No atrial level shunt detected by color flow Doppler.  LEFT VENTRICLE PLAX 2D LVIDd:         5.11 cm  Diastology LVIDs:         3.43 cm  LV e' medial:    6.96 cm/s LV PW:         0.93 cm  LV E/e' medial:  9.5 LV IVS:        0.84 cm  LV e' lateral:   9.14 cm/s LVOT diam:     2.30 cm  LV E/e' lateral: 7.2 LV SV:         63 LV SV Index:   37 LVOT Area:     4.15 cm  RIGHT VENTRICLE RV Basal diam:  2.93 cm RV S prime:     14.10 cm/s TAPSE (M-mode): 2.9 cm LEFT ATRIUM             Index       RIGHT ATRIUM          Index LA diam:        4.00 cm 2.37 cm/m  RA Area:     9.60 cm LA Vol (A2C):   36.9 ml 21.88 ml/m RA Volume:   19.50 ml 11.56 ml/m LA Vol (A4C):   28.9 ml 17.13 ml/m LA Biplane Vol: 33.9 ml 20.10 ml/m  AORTIC VALVE LVOT Vmax:   63.10 cm/s LVOT Vmean:  46.900 cm/s LVOT VTI:    0.152 m  AORTA Ao Root diam: 3.40 cm MITRAL VALVE MV Area (PHT): 2.03 cm    SHUNTS MV Decel Time: 373 msec    Systemic VTI:  0.15 m MV E velocity: 66.20 cm/s  Systemic Diam: 2.30 cm MV A velocity: 90.80 cm/s MV E/A ratio:  0.73 Kate Sable MD Electronically signed by Kate Sable MD Signature Date/Time: 04/24/2020/2:08:45 PM    Final    [place] EKG SITE RITE  Result Date: 04/23/2020 If Site Rite image not attached, placement could not be confirmed due to current cardiac rhythm.    CODE STATUS:     Code Status Orders  (From admission, onward)         Start     Ordered   04/23/20 1739  Full code  Continuous        04/23/20 1740        Code Status History    Date Active Date Inactive Code Status Order ID Comments User Context   03/07/2020 1709 03/09/2020 2008 Full Code [id number]  Howerter,  Ethelda Chick, DO ED   01/25/2020 1505 01/27/2020 0611 Full Code [id number]  Collier Bullock, MD ED   12/12/2019 1820 12/14/2019 1855 Full Code [id number]  Buford Dresser, MD ED   11/05/2019 2001 11/07/2019 2314 Full Code  [id number]  Ivor Costa, MD ED   10/13/2019 2314 10/20/2019 2301 Full Code [id number]  Elwyn Reach, MD ED   09/03/2019 0528 09/12/2019 1946 Full Code [id number]  Vianne Bulls, MD Inpatient   09/01/2019 1619 09/02/2019 0147 Full Code [id number]  Fritzi Mandes, MD ED   04/18/2019 1951 04/21/2019 1726 Full Code [id number]  Ivor Costa, MD ED   01/27/2019 2340 01/28/2019 1848 Full Code [id number]  Athena Masse, MD ED   01/19/2019 2041 01/23/2019 1440 Full Code [id number]  Mansy, Arvella Merles, MD ED   10/13/2018 0746 10/19/2018 0127 Full Code [id number]  Mendel Corning, MD Inpatient   10/12/2018 2324 10/13/2018 0746 DNR [id number]  Orene Desanctis, DO Inpatient   10/12/2018 2237 10/12/2018 2324 Full Code [id number]  Orene Desanctis, DO ED   06/26/2018 0142 06/28/2018 1530 Full Code [id number]  Mayer Camel, NP ED   06/14/2018 2333 06/16/2018 1713 Full Code [id number]  Mayer Camel, NP ED   03/02/2018 1650 03/04/2018 1709 Full Code [id number]  Lavella Hammock, MD Inpatient   02/17/2018 2322 02/19/2018 1710 Full Code [id number]  Vaughan Basta, MD Inpatient   12/19/2017 1624 12/21/2017 1544 Full Code [id number]  Gladstone Lighter, MD Inpatient   09/19/2017 1657 09/21/2017 1519 Full Code [id number]  Loletha Grayer, MD ED   06/29/2017 1241 06/30/2017 1523 Full Code [id number]  Wellington Hampshire, MD Inpatient   04/09/2017 1448 04/13/2017 1738 DNR [id number]  Nicholes Mango, MD Inpatient   03/18/2017 2348 03/22/2017 1844 Full Code [id number]  Henreitta Leber, MD ED   03/16/2017 1539 03/16/2017 1937 Full Code [id number]  Corky Mull, MD Inpatient   01/27/2017 1934 01/29/2017 1839 Full Code [id number]  Gorden Harms, MD Inpatient   01/07/2016 1630 01/07/2016 2016 Full Code [id number]  Corky Mull, MD Inpatient   12/14/2015 0239 12/14/2015 1733 Full Code [id number]  Harrie Foreman, MD ED   11/07/2015 1521 11/08/2015 1920 Full Code [id number]  Baxter Hire, MD Inpatient   10/28/2015 1604 11/02/2015 1504 Full Code [id number]  Henreitta Leber, MD  Inpatient   06/03/2015 1510 06/05/2015 1501 Full Code [id number]  Fritzi Mandes, MD Inpatient   04/17/2015 2159 04/18/2015 2045 Full Code [id number]  Vaughan Basta, MD ED   02/15/2015 2004 02/20/2015 1903 Full Code [id number]  Gonzella Lex, MD Inpatient   11/12/2014 0833 11/13/2014 1449 Full Code [id number]  Isaias Cowman, MD Inpatient   11/07/2014 2153 11/12/2014 0833 Full Code [id number]  Henreitta Leber, MD Inpatient   Advance Care Planning Activity       TOTAL TIME TAKING CARE OF THIS PATIENT: *35* minutes.    Fritzi Mandes M.D  [facility]    CC: Primary care physician; [name], MD

## 2020-04-25 NOTE — TOC Transition Note (Signed)
Transition of Care [facility]) - CM/SW Discharge Note   Patient Details  Name: [name] MRN: [id number] Date of Birth: [birth year]  Transition of Care [facility]) CM/SW Contact:  Kerin Salen, RN Phone Number: 04/25/2020, 2:35 PM   Clinical Narrative:  Spoke with patient, who is requesting a ride to home for discharge. Son will not be available until tomorrow morning. [place] called and voucher given to clerk at nurses station, patient receptive.          Patient Goals and CMS Choice        Discharge Placement                       Discharge Plan and Services                                     Social Determinants of Health (SDOH) Interventions     Readmission Risk Interventions Readmission Risk Prevention Plan 11/07/2019 10/18/2019 09/05/2019  Transportation Screening Complete Complete Complete  Medication Review Press photographer) Complete Complete Complete  PCP or Specialist appointment within 3-5 days of discharge (No Data) Complete Complete  HRI or Home Care Consult Complete Complete -  SW Recovery Care/Counseling Consult - Complete Complete  SW Consult Not Complete Comments - - -  Palliative Care Screening Not Applicable Not Applicable Not Applicable  Skilled Nursing Facility Not Applicable Not Applicable Not Applicable  Some recent data might be hidden

## 2020-04-28 LAB — CULTURE, BLOOD (ROUTINE X 2)
Culture: NO GROWTH
Culture: NO GROWTH

## 2020-04-28 IMAGING — US US CAROTID DUPLEX BILAT
1 series · 13 of 24 positions shown · non-contrast
Comparison: 02/17/2018

CLINICAL DATA: Small acute left frontal infarct

EXAM:
BILATERAL CAROTID DUPLEX ULTRASOUND
TECHNIQUE: Gray scale imaging, color Doppler and duplex ultrasound were
performed of bilateral carotid and vertebral arteries in the neck.

[Series 1: us carotid duplex bilat · 13 of 65 slices shown]
[im 1/65]
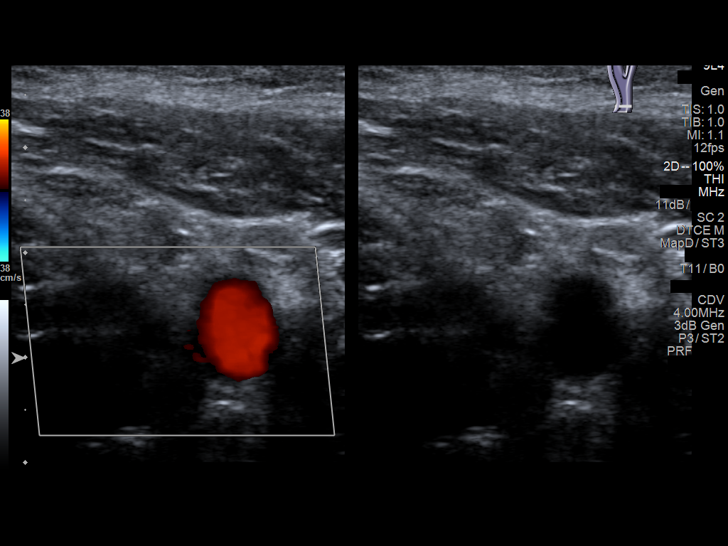
[im 6/65]
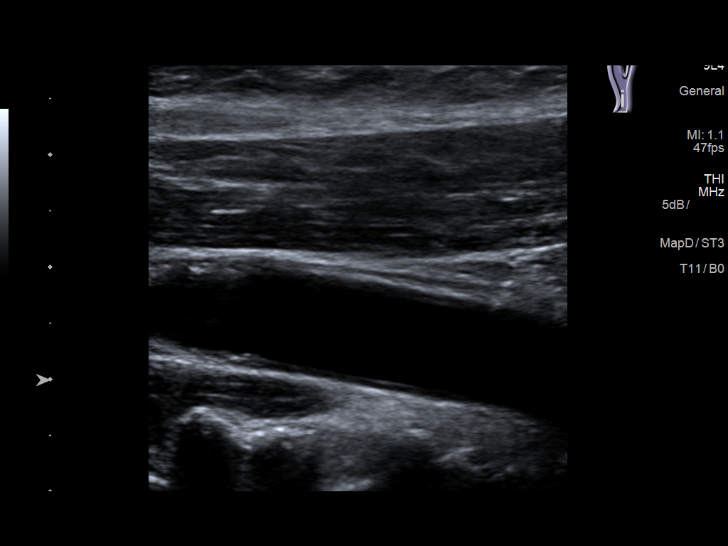
[im 12/65]
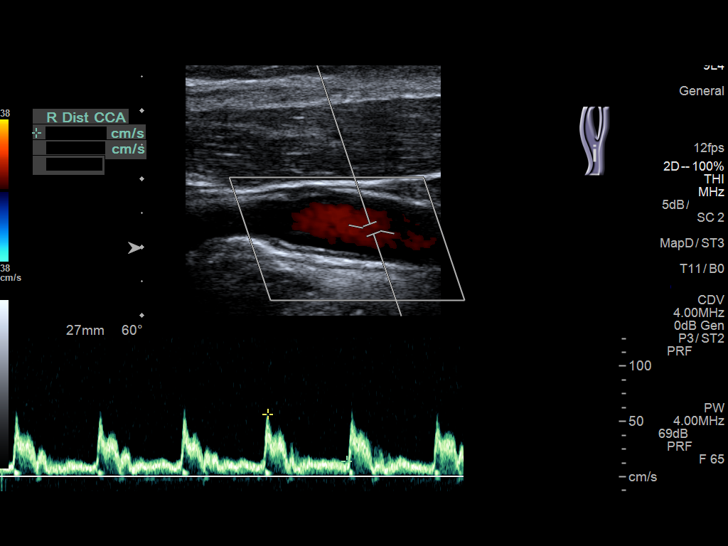
[im 17/65]
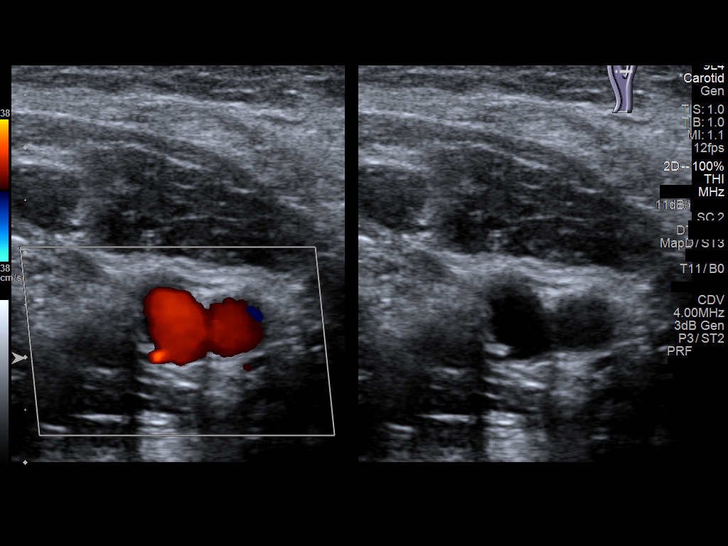
[im 23/65]
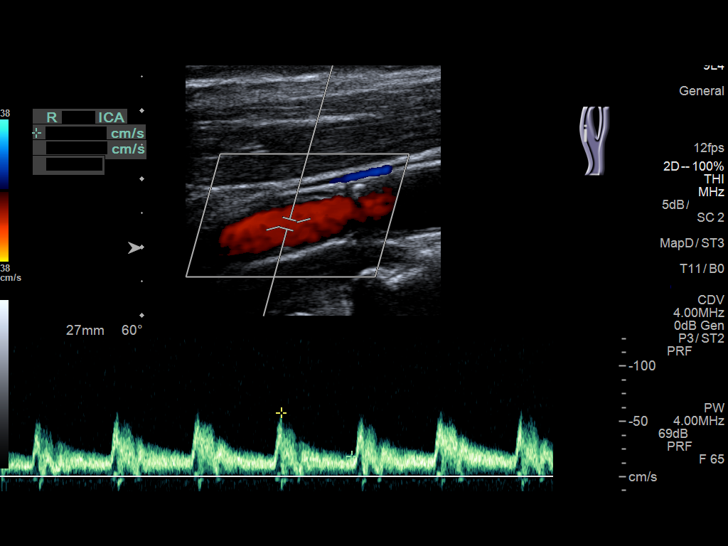
[im 28/65]
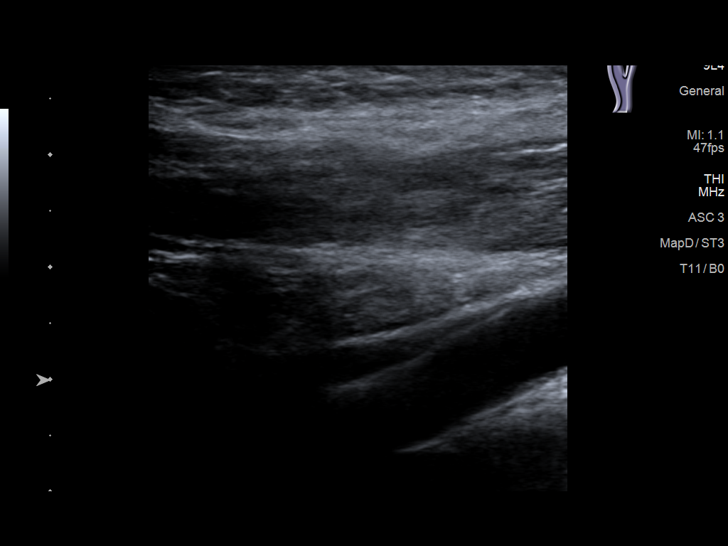
[im 34/65]
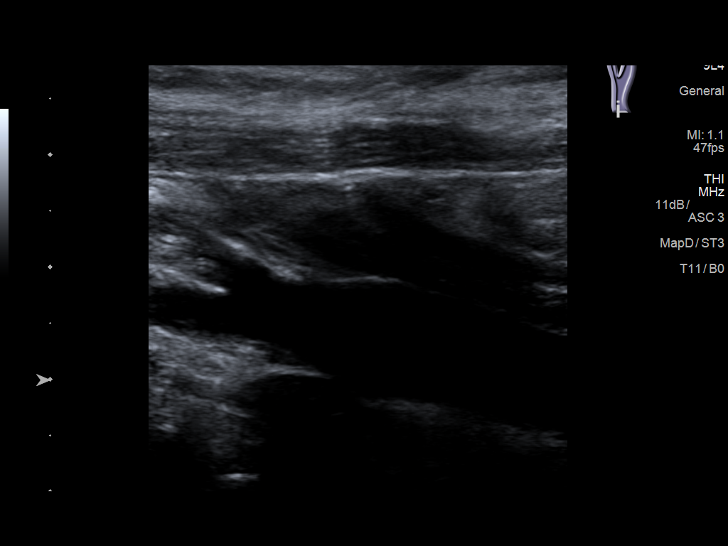
[im 37/65]
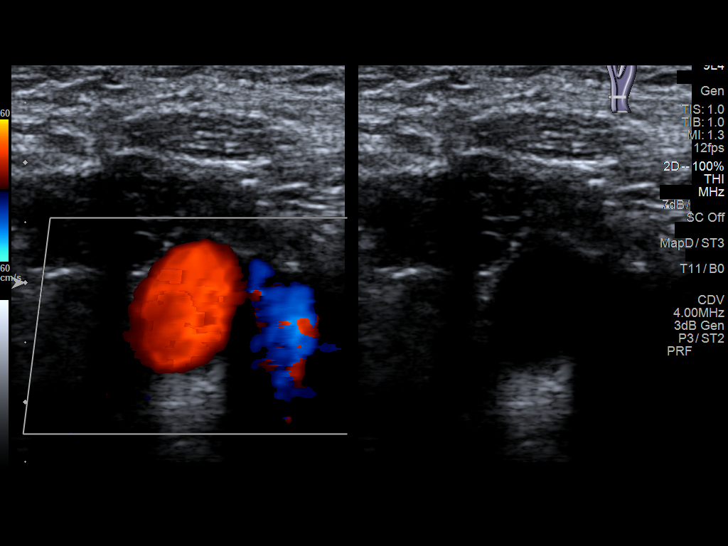
[im 42/65]
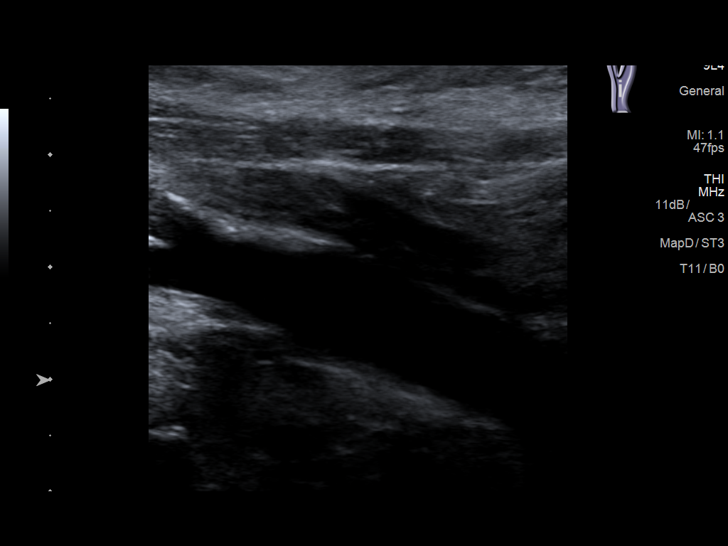
[im 48/65]
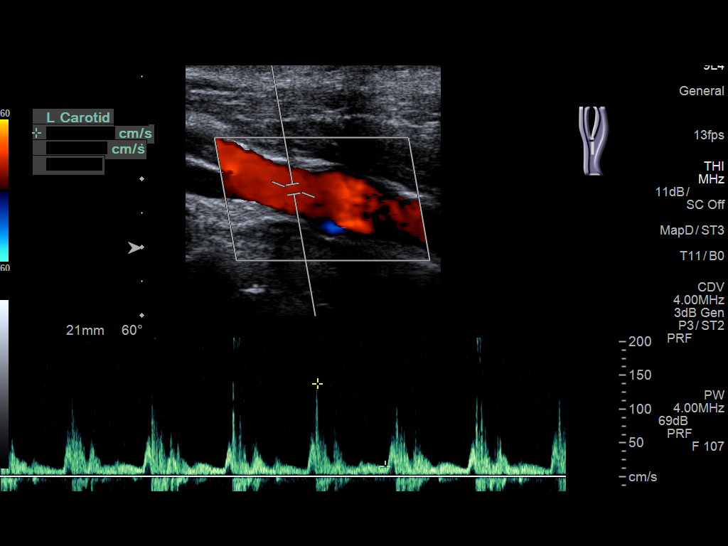
[im 53/65]
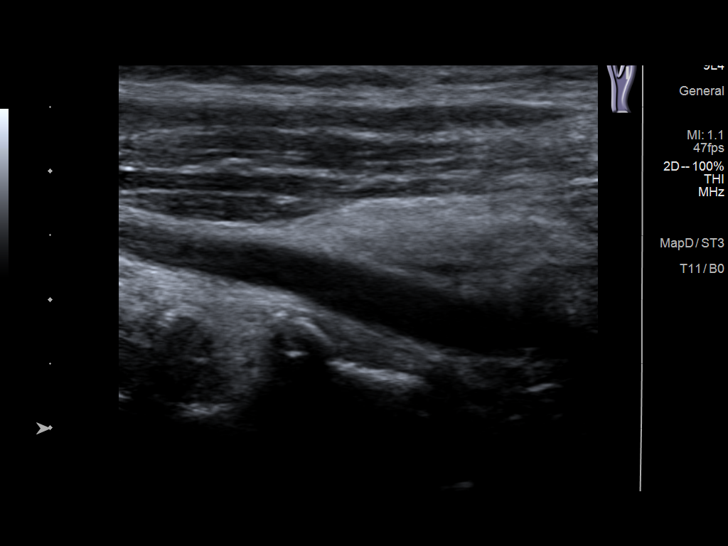
[im 59/65]
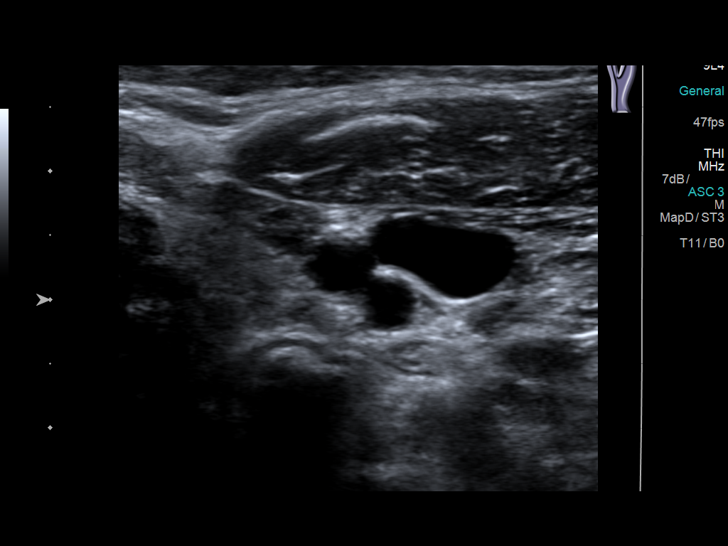
[im 65/65]
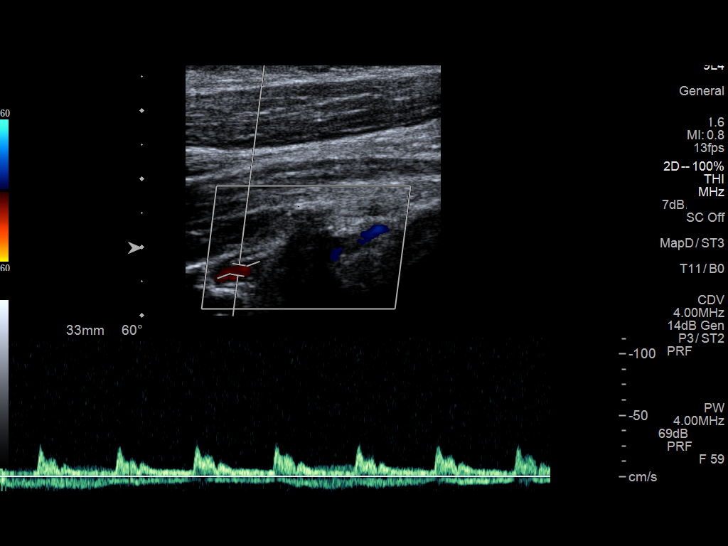

[13 of 24 positions shown; findings below may reference images not displayed]

FINDINGS: Criteria: Quantification of carotid stenosis is based on velocity
parameters that correlate the residual internal carotid diameter
with NASCET-based stenosis levels, using the diameter of the distal
internal carotid lumen as the denominator for stenosis measurement.

The following velocity measurements were obtained:

RIGHT

ICA: 79/24 cm/sec

CCA: 64/10 cm/sec

SYSTOLIC ICA/CCA RATIO:

ECA: 123 cm/sec

LEFT

ICA: 90/35 cm/sec

CCA: 142/12 cm/sec

SYSTOLIC ICA/CCA RATIO:

ECA: 95 cm/sec

RIGHT CAROTID ARTERY: Minor intimal thickening and carotid
atherosclerosis. No hemodynamically significant right ICA stenosis,
velocity elevation, or turbulent flow. Degree of narrowing less than
50%.

RIGHT VERTEBRAL ARTERY:  Antegrade

LEFT CAROTID ARTERY: Similar minor intimal thickening and carotid
atherosclerosis. No hemodynamically significant left ICA stenosis,
velocity elevation, or turbulent flow.

LEFT VERTEBRAL ARTERY:  Antegrade
IMPRESSION: Minor carotid atherosclerosis. No hemodynamically significant ICA
stenosis. Degree of narrowing less than 50% bilaterally by
ultrasound criteria.

Patent antegrade vertebral flow bilaterally

## 2020-05-02 ENCOUNTER — Ambulatory Visit (INDEPENDENT_AMBULATORY_CARE_PROVIDER_SITE_OTHER): Payer: Medicare Other

## 2020-05-02 ENCOUNTER — Encounter: Payer: Self-pay | Admitting: Family Medicine

## 2020-05-02 ENCOUNTER — Ambulatory Visit (INDEPENDENT_AMBULATORY_CARE_PROVIDER_SITE_OTHER): Payer: Medicare Other | Admitting: Internal Medicine

## 2020-05-02 ENCOUNTER — Other Ambulatory Visit: Payer: Self-pay

## 2020-05-02 ENCOUNTER — Encounter: Payer: Self-pay | Admitting: Internal Medicine

## 2020-05-02 VITALS — BP 110/68 | HR 73 | Ht 63.0 in | Wt 144.4 lb

## 2020-05-02 DIAGNOSIS — I5022 Chronic systolic (congestive) heart failure: Secondary | ICD-10-CM

## 2020-05-02 DIAGNOSIS — R002 Palpitations: Secondary | ICD-10-CM

## 2020-05-02 DIAGNOSIS — I25118 Atherosclerotic heart disease of native coronary artery with other forms of angina pectoris: Secondary | ICD-10-CM

## 2020-05-02 DIAGNOSIS — I951 Orthostatic hypotension: Secondary | ICD-10-CM

## 2020-05-02 DIAGNOSIS — I255 Ischemic cardiomyopathy: Secondary | ICD-10-CM

## 2020-05-02 DIAGNOSIS — F321 Major depressive disorder, single episode, moderate: Secondary | ICD-10-CM

## 2020-05-02 MED ORDER — CARVEDILOL 12.5 MG PO TABS: 12.5000 mg | ORAL_TABLET | Freq: Two times a day (BID) | ORAL | 6 refills | Status: DC

## 2020-05-02 NOTE — Progress Notes (Signed)
Patient Care Team: [name], MD as PCP - General (Family Medicine) [name] Kathlene November, MD as PCP - Cardiology (Cardiology) Vladimir Crofts, MD as Consulting Physician (Neurology) Anthonette Legato, MD as Consulting Physician (Nephrology) Lloyd Huger, MD as Consulting Physician (Oncology) Poggi, Marshall Cork, MD as Consulting Physician (Orthopedic Surgery) Warnell Forester, NP as Nurse Practitioner (Endocrinology) Caroline More, DPM as Consulting Physician (Podiatry) Copland, Ginette Otto as Referring Physician (Obstetrics and Gynecology) Danice Goltz, MD as Consulting Physician (Ophthalmology) Carloyn Manner, MD as Referring Physician (Otolaryngology)   HPI  [name] is a 50 y.o. female Seen in followup for orthostatic hypotension with syncope in context of CAD s/p PCI, prior gastric bypass surgery.      Admitted 3.19 with syncope    Found to be orthostatic ( BP sys 123>>>78// HR 73>> 99 ) ,  MRI>> "patchy acute v subacute infarcts" perhaps watershed 2/2 ? Orthostatic hypotension she had been referred for event recorder which I elected not to pursue  Problems with orthostatic intolerance, heat intolerance shower intolerance.  She is not sure whether this precedes or postdates her gastric bypass surgery undertaken 11 years ago. 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  Recurrent syncope and presyncope.  Has had documented blood pressures in the 50s. Mestinon previously used has been discontinued.  Also worries about "V. tach "told at [facility] as well as [facility] that she had it.  Review of telemetry through 11/21 failed to reveal arrhythmia  DATE TEST EF   10/16 Echo   45-50 %   10/16 LHC   % LAD DES  5/17  LHC  D1 DES  2/19 Echo  60-65%   5/19 LHC  D2 POBA, dLAD 70%>> med Rx  8/19 LHC  D2 cutting balloon  3/22 Echo 60-65%    She reports ongoing weight loss; however, looking at her weight curve it has been relatively stable now for about 18 months in the 150 range.  She is worried about her being too fat.  She tells me that she weighed 350 pounds at the time of her gastric bypass  She has spells suspicious for MS and MRI showed suspicious lesions, although spinal tap unrevealing        Past Medical History:  Diagnosis Date  . Acute colitis 01/27/2017  . Acute pyelonephritis   . Anemia    iron deficiency anemia  . Aortic arch aneurysm ([place])   . BRCA negative 2014  . CAD (coronary artery disease)    a. 08/2003 Cath: LAD 30-40-med Rx; b. 11/2014 PCI: LAD 22m(3.25x23 Xience Alpine DES); c. 06/2015 PCI: D1 (2.25x12 Resolute Integrity DES); d. 06/2017 PCI: Patent mLAD stent, D2 95 (PTCA); e. 09/2017 PCI: D2 99ost (CBA); d. 12/2017 Cath: LM nl, LAD 34m80d (small), D1 40ost, D2 95ost, LCX 40p, RCA 40ost/p->Med rx for D2 given restenosis.  . Colitis 06/03/2015  . Colon polyp   . CVA (cerebral vascular accident) ([facility]   Left side weakness.   . Degenerative tear of glenoid labrum of right shoulder 03/15/2017  . Diabetes mellitus without complication ([place]  . Family history of breast cancer    BRCA neg 2014  . Femur fracture, left ([place]/10/[place]  .  Gastric ulcer 04/27/2011  . History of echocardiogram    a. 03/2017 Echo: EF 60-65%, no rwma; b. 02/2018 Echo: EF 60-65%, no rwma. Nl RV fxn. No cardiac source of emboli (admitted w/ stroke).  . Hypertension   . Malignant melanoma of skin of scalp ([place])   . MI, acute, non ST segment elevation ([facility])   . Neuromuscular disorder ([place])   . S/P drug eluting coronary stent placement 06/04/2015  . Sepsis ([place]) 03/18/2017  . Stroke [facility])    a. 02/2018 MRI: 110m late acute/early subacute L medial frontal lobe inarct; b. 02/2018 MRA No large vessel occlusion  or aneurysm. Mod to sev L P2 stenosis. thready L vertebral artery, diffusely dzs'd; c. 02/2018 Carotid U/S: <50% bilat ICA dzs.    Past Surgical History:  Procedure Laterality Date  . APPENDECTOMY    . BALLOON ENTEROSCOPY  02/06/2020   [facility]  . BIOPSY N/A 03/14/2020   Procedure: BIOPSY;  Surgeon: WLucilla Lame MD;  [place]  Service: Endoscopy;  Laterality: N/A;  . CARDIAC CATHETERIZATION N/A 11/09/2014   Procedure: Coronary Angiography;  Surgeon: TMinna Merritts MD;  [place];  Service: Cardiovascular;  Laterality: N/A;  . CARDIAC CATHETERIZATION N/A 11/12/2014   Procedure: Coronary Stent Intervention;  Surgeon: AIsaias Cowman MD;  [place] [facility];  Service: Cardiovascular;  Laterality: N/A;  . CARDIAC CATHETERIZATION N/A 04/18/2015   Procedure: Left Heart Cath and Coronary Angiography;  Surgeon: TMinna Merritts MD;  [place];  Service: Cardiovascular;  Laterality: N/A;  . CARDIAC CATHETERIZATION Left 06/04/2015   Procedure: Left Heart Cath and Coronary Angiography;  Surgeon: MWellington Hampshire MD;  [place] [facility];  Service: Cardiovascular;  Laterality: Left;  . CARDIAC CATHETERIZATION N/A 06/04/2015   Procedure: Coronary Stent Intervention;  Surgeon: MWellington Hampshire MD;  [place] [facility];  Service: Cardiovascular;  Laterality: N/A;  . CESAREAN SECTION  2001  . CHOLECYSTECTOMY N/A 11/18/2016   Procedure: LAPAROSCOPIC CHOLECYSTECTOMY WITH INTRAOPERATIVE CHOLANGIOGRAM;  Surgeon: SChristene Lye MD;  Location: [facility];  Service: General;  Laterality: N/A;  . COLONOSCOPY WITH PROPOFOL N/A 04/27/2016   Procedure: COLONOSCOPY WITH PROPOFOL;  Surgeon: DLucilla Lame MD;  [place]  Service: Endoscopy;  Laterality: N/A;  . COLONOSCOPY WITH PROPOFOL N/A 01/12/2018   Procedure: COLONOSCOPY WITH PROPOFOL;  Surgeon: [place], TBenay Pike MD;  Location: [facility]  ENDOSCOPY;  Service: Endoscopy;  Laterality: N/A;  . COLONOSCOPY WITH PROPOFOL N/A 03/14/2020   Procedure: COLONOSCOPY WITH PROPOFOL;  Surgeon: WLucilla Lame MD;  [place]  Service: Endoscopy;  Laterality: N/A;  Needs to be scheduled after 7:30 due to driver issues  . CORONARY ANGIOPLASTY    . CORONARY BALLOON ANGIOPLASTY N/A 06/29/2017   Procedure: CORONARY BALLOON ANGIOPLASTY;  Surgeon: AWellington Hampshire MD;  [place];  Service: Cardiovascular;  Laterality: N/A;  . CORONARY BALLOON ANGIOPLASTY N/A 09/20/2017   Procedure: CORONARY BALLOON ANGIOPLASTY;  Surgeon: AWellington Hampshire MD;  [place];  Service: Cardiovascular;  Laterality: N/A;  . CORONARY STENT INTERVENTION N/A 12/13/2019   Procedure: CORONARY STENT INTERVENTION;  Surgeon: AWellington Hampshire MD;  [place];  Service: Cardiovascular;  Laterality: N/A;  . DILATION AND CURETTAGE OF UTERUS    . ESOPHAGOGASTRODUODENOSCOPY (EGD) WITH PROPOFOL N/A 09/14/2014   Procedure: ESOPHAGOGASTRODUODENOSCOPY (EGD) WITH PROPOFOL;  Surgeon: MJosefine Class MD;  [facility]  Service: Endoscopy;  Laterality: N/A;  . ESOPHAGOGASTRODUODENOSCOPY (EGD) WITH PROPOFOL N/A 04/27/2016   Procedure: ESOPHAGOGASTRODUODENOSCOPY (EGD) WITH PROPOFOL;  Surgeon: [name], MD;  Location: [place];  Service: Endoscopy;  Laterality: N/A;  Diabetic - oral meds  . ESOPHAGOGASTRODUODENOSCOPY (EGD) WITH PROPOFOL N/A 01/12/2018   Procedure: ESOPHAGOGASTRODUODENOSCOPY (EGD) WITH PROPOFOL;  Surgeon: Toledo, Benay Pike, MD;  Location: [facility] ENDOSCOPY;  Service: Endoscopy;  Laterality: N/A;  . ESOPHAGOGASTRODUODENOSCOPY (EGD) WITH PROPOFOL N/A 04/11/2019   Procedure: ESOPHAGOGASTRODUODENOSCOPY (EGD) WITH PROPOFOL;  Surgeon: [name], MD;  Location: [facility]OS[facility];  Service: Endoscopy;  Laterality: N/A;  . ESOPHAGOGASTRODUODENOSCOPY (EGD) WITH PROPOFOL N/A 03/08/2020    Procedure: ESOPHAGOGASTRODUODENOSCOPY (EGD) WITH PROPOFOL;  Surgeon: [name], MD;  [facility] END[facility]COPY;  Service: Endoscopy;  Laterality: N/A;  . GASTRIC BYPASS  09/2009   [place] (IM) NAIL INTERTROCHANTERIC Left 10/13/2018   Procedure: INTRAMEDULLARY (IM) NAIL INTERTROCHANTRIC;  Surgeon: Leandrew Koyanagi, MD;  Location: [place];  Service: Orthopedics;  Laterality: Left;  . Left Carotid to sublcavian artery bypass w/ subclavian artery ligation     a. Performed @ [place].  . LEFT HEART CATH AND CORONARY ANGIOGRAPHY Left 06/29/2017   Procedure: LEFT HEART CATH AND CORONARY ANGIOGRAPHY;  Surgeon: Wellington Hampshire, MD;  Location: [place] CV LAB;  Service: Cardiovascular;  Laterality: Left;  . LEFT HEART CATH AND CORONARY ANGIOGRAPHY N/A 09/20/2017   Procedure: LEFT HEART CATH AND CORONARY ANGIOGRAPHY;  Surgeon: Wellington Hampshire, MD;  Location: [place] CV LAB;  Service: Cardiovascular;  Laterality: N/A;  . LEFT HEART CATH AND CORONARY ANGIOGRAPHY N/A 12/20/2017   Procedure: LEFT HEART CATH AND CORONARY ANGIOGRAPHY;  Surgeon: Wellington Hampshire, MD;  Location: [place] CV LAB;  Service: Cardiovascular;  Laterality: N/A;  . LEFT HEART CATH AND CORONARY ANGIOGRAPHY N/A 04/20/2019   Procedure: LEFT HEART CATH AND CORONARY ANGIOGRAPHY possible PCI;  Surgeon: Minna Merritts, MD;  Location: [place] CV LAB;  Service: Cardiovascular;  Laterality: N/A;  . LEFT HEART CATH AND CORONARY ANGIOGRAPHY N/A 12/13/2019   Procedure: LEFT HEART CATH AND CORONARY ANGIOGRAPHY;  Surgeon: Wellington Hampshire, MD;  Location: [place] CV LAB;  Service: Cardiovascular;  Laterality: N/A;  . MELANOMA EXCISION  2016   Dr. Evorn Gong  . [place]  2002  . RIGHT OOPHORECTOMY    . SHOULDER ARTHROSCOPY WITH OPEN ROTATOR CUFF REPAIR Right 01/07/2016   Procedure: SHOULDER ARTHROSCOPY WITH DEBRIDMENT, SUBACHROMIAL DECOMPRESSION;  Surgeon: Corky Mull, MD;  Location:  [facility] ORS;  Service: Orthopedics;  Laterality: Right;  . SHOULDER ARTHROSCOPY WITH OPEN ROTATOR CUFF REPAIR Right 03/16/2017   Procedure: SHOULDER ARTHROSCOPY WITH OPEN ROTATOR CUFF REPAIR POSSIBLE BICEPS TENODESIS;  Surgeon: Corky Mull, MD;  Location: [facility];  Service: Orthopedics;  Laterality: Right;  . TRIGGER FINGER RELEASE Right     Middle Finger    Current Meds  Medication Sig  . AJOVY 225 MG/1.5ML SOAJ Inject into the skin.  Marland Kitchen alosetron (LOTRONEX) 1 MG tablet Take 1 tablet (1 mg total) by mouth 2 (two) times daily.  Marland Kitchen ALPRAZolam (XANAX) 1 MG tablet Take 1 tablet by mouth twice daily as needed  . amLODipine (NORVASC) 2.5 MG tablet Take 1 tablet (2.5 mg total) by mouth daily.  Marland Kitchen buPROPion (WELLBUTRIN SR) 200 MG 12 hr tablet Take 200 mg by mouth every morning.  . carvedilol (COREG) 25 MG tablet Take 1 tablet (25 mg total) by mouth 2 (two) times daily  with a meal.  . clopidogrel (PLAVIX) 75 MG tablet Take 75 mg by mouth daily.  Marland Kitchen desvenlafaxine (PRISTIQ) 50 MG 24 hr tablet Take 50 mg by mouth daily.  . diphenoxylate-atropine (LOMOTIL) 2.5-0.025 MG tablet Take 1 tablet by mouth 4 (four) times daily as needed for diarrhea or loose stools.  Arna Medici 25 MCG tablet TAKE 1 TABLET BY MOUTH ONCE DAILY BEFORE BREAKFAST  . famotidine (PEPCID) 20 MG tablet Take 20 mg by mouth 2 (two) times daily.  Marland Kitchen gabapentin (NEURONTIN) 300 MG capsule Take 2 capsules (600 mg total) by mouth 2 (two) times daily.  . Galcanezumab-gnlm (EMGALITY) 120 MG/ML SOAJ Inject 120 mg into the skin every 28 (twenty-eight) days.  Marland Kitchen glucose 4 GM chewable tablet Chew 1 tablet (4 g total) by mouth 3 (three) times daily.  . isosorbide mononitrate (IMDUR) 120 MG 24 hr tablet Take 1 tablet (120 mg total) by mouth daily.  . nitroGLYCERIN (NITROSTAT) 0.4 MG SL tablet Place 1 tablet (0.4 mg total) under the tongue every 5 (five) minutes as needed for chest pain.  Marland Kitchen Phenazopyridine HCl (AZO TABS PO) Take 2 tablets by mouth daily.  Vladimir Faster Glycol-Propyl Glycol (SYSTANE OP) Place 1 drop into both eyes daily as needed (dry eyes).  . promethazine (PHENERGAN) 25 MG tablet TAKE 1 TABLET BY MOUTH EVERY 6 HOURS AS NEEDED FOR NAUSEA FOR VOMITING  . rosuvastatin (CRESTOR) 40 MG tablet Take 1 tablet (40 mg total) by mouth daily at 6 PM.  . traZODone (DESYREL) 100 MG tablet TAKE 1 TO 2 TABLETS BY MOUTH AT BEDTIME    Allergies  Allergen Reactions  . Lipitor [Atorvastatin] Other (See Comments)    Leg pains      Review of Systems negative except from HPI and PMH  Physical Exam BP 110/68   Pulse 73   Ht '5\' 3"'  (1.6 m)   Wt 144 lb 6.4 oz (65.5 kg)   SpO2 94%   BMI 25.58 kg/m  Well developed and nourished in no acute distress HENT normal Neck supple with JVP-  Flat  Clear Regular rate and rhythm, 2/6 systolic murmur abd-soft with active BS No Clubbing cyanosis edema Skin-warm and dry excoriations over her l right forearm A & Oriented  Grossly normal sensory and motor function  ECG sinus at 73 Interval 17/08/38  Assessment and  Plan  Orthostatic hypotension and syncope  CAD with prior PCI  Chest pain  Strokes " watershed"  Palpitations  Renal insufficiency chronic grade 3-4  Hx Gastric Bypass Surgery  Anomalous Aortic circulation with carotid -L [place] artery bypass    Orthostatic hypotension remains significantly problematic.  Blood pressures are lower; we will discontinue her amlodipine and decrease her carvedilol from 25--12.5.  I am inclined towards, in particular given her pharmacopeia, towards nonpharmacological therapies for her orthostasis by lowering her systolic blood pressure to drift up.  We also discussed compression of the abdomen and thighs and reviewed with her this on line  Palpitations remain problematic.  She says that she has "V. tach "although I do not see records of this.  We will utilize an event recorder.  She is to see Dr. Deidre Ala in about 3 weeks and hopefully this data will be  available  Current medicines are reviewed at length with the patient today .  The patient does not have concerns regarding medicines.

## 2020-05-02 NOTE — Patient Instructions (Signed)
Medication Instructions:  - Your physician has recommended you make the following change in your medication:   1) STOP norvasc (amlodipine)  2) DECREASE coreg (carvedilol) to 12.5 mg- take 1 tablet by mouth twice daily   *If you need a refill on your cardiac medications before your next appointment, please call your pharmacy*   Lab Work: - none ordered  If you have labs (blood work) drawn today and your tests are completely normal, you will receive your results only by: Marland Kitchen MyChart Message (if you have MyChart) OR . A paper copy in the mail If you have any lab test that is abnormal or we need to change your treatment, we will call you to review the results.   Testing/Procedures: - Your physician has recommended that you wear a Zio XT (heart) monitor x 7 days (05/02/20-05/09/20) . This monitor is a medical device that records the heart's electrical activity. Doctors most often use these monitors to diagnose arrhythmias. Arrhythmias are problems with the speed or rhythm of the heartbeat. The monitor is a small device applied to your chest. You can wear one while you do your normal daily activities. While wearing this monitor if you have any symptoms to push the button and record what you felt. Once you have worn this monitor for the period of time provider prescribed (Usually 14 days), you will return the monitor device in the postage paid box. Once it is returned they will download the data collected and provide [place] with a report which the provider will then review and we will call you with those results. Important tips:  1. Avoid showering during the first 24 hours of wearing the monitor. 2. Avoid excessive sweating to help maximize wear time. 3. Do not submerge the device, no hot tubs, and no swimming pools. 4. Keep any lotions or oils away from the patch. 5. After 24 hours you may shower with the patch on. Take brief showers with your back facing the shower head.  6. Do not remove patch once  it has been placed because that will interrupt data and decrease adhesive wear time. 7. Push the button when you have any symptoms and write down what you were feeling. 8. Once you have completed wearing your monitor, remove and place into box which has postage paid and place in your outgoing mailbox.  9. If for some reason you have misplaced your box then call our office and we can provide another box and/or mail it off for you.         Follow-Up: At [facility], you and your health needs are our priority.  As part of our continuing mission to provide you with exceptional heart care, we have created designated Provider Care Teams.  These Care Teams include your primary Cardiologist (physician) and Advanced Practice Providers (APPs -  Physician Assistants and Nurse Practitioners) who all work together to provide you with the care you need, when you need it.  We recommend signing up for the patient portal called "MyChart".  Sign up information is provided on this After Visit Summary.  MyChart is used to connect with patients for Virtual Visits (Telemedicine).  Patients are able to view lab/test results, encounter notes, upcoming appointments, etc.  Non-urgent messages can be sent to your provider as well.   To learn more about what you can do with MyChart, go to [email or web address].    Your next appointment:   6 month(s)  The format for your next appointment:   In  Person  Provider:   Virl Axe, MD   Other Instructions

## 2020-05-03 MED ORDER — DESVENLAFAXINE SUCCINATE ER 50 MG PO TB24: 50.0000 mg | ORAL_TABLET | Freq: Every day | ORAL | 1 refills | Status: DC

## 2020-05-03 NOTE — Addendum Note (Signed)
Addended by: Ernie Hew D on: 05/03/2020 11:43 AM   Modules accepted: Orders

## 2020-05-03 NOTE — Addendum Note (Signed)
Addended by: [name] on: 05/03/2020 01:28 PM   Modules accepted: Orders

## 2020-05-07 ENCOUNTER — Encounter: Payer: Self-pay | Admitting: Gastroenterology

## 2020-05-07 ENCOUNTER — Other Ambulatory Visit: Payer: Self-pay

## 2020-05-07 ENCOUNTER — Ambulatory Visit (INDEPENDENT_AMBULATORY_CARE_PROVIDER_SITE_OTHER): Payer: Medicare Other | Admitting: Gastroenterology

## 2020-05-07 VITALS — BP 126/86 | HR 118 | Temp 97.9°F | Ht 63.0 in | Wt 146.5 lb

## 2020-05-07 DIAGNOSIS — R197 Diarrhea, unspecified: Secondary | ICD-10-CM | POA: Diagnosis not present

## 2020-05-07 MED ORDER — DIPHENOXYLATE-ATROPINE 2.5-0.025 MG PO TABS: 1.0000 | ORAL_TABLET | Freq: Four times a day (QID) | ORAL | 5 refills | Status: DC | PRN

## 2020-05-07 NOTE — Progress Notes (Signed)
Primary Care Physician: [name], MD  Primary Gastroenterologist:  Dr. [name]  Chief Complaint  Patient presents with  . Diarrhea    HPI: [name] is a 50 y.o. female here for follow-up after having colonoscopy for diarrhea.  The patient was recently in the hospital for sepsis.  The patient's colonoscopy had biopsies taken of the terminal ileum and random biopsies throughout the colon that showed:  DIAGNOSIS:  A. TERMINAL ILEUM; COLD BIOPSY:  - ILEAL MUCOSA WITH INTACT VILLI AND PEYER'S PATCHES.  - NEGATIVE FOR INTRAEPITHELIAL LYMPHOCYTOSIS, ACTIVE INFLAMMATION, AND  INFECTIOUS AGENTS.   B. COLON; RANDOM COLD BIOPSY:  - COLONIC MUCOSA WITH INTACT CRYPT ARCHITECTURE.  - NEGATIVE FOR MICROSCOPIC COLITIS, DYSPLASIA, AND MALIGNANCY.     Past Medical History:  Diagnosis Date  . Acute colitis 01/27/2017  . Acute pyelonephritis   . Anemia    iron deficiency anemia  . Aortic arch aneurysm ([place])   . BRCA negative 2014  . CAD (coronary artery disease)    a. 08/2003 Cath: LAD 30-40-med Rx; b. 11/2014 PCI: LAD 75m(3.25x23 Xience Alpine DES); c. 06/2015 PCI: D1 (2.25x12 Resolute Integrity DES); d. 06/2017 PCI: Patent mLAD stent, D2 95 (PTCA); e. 09/2017 PCI: D2 99ost (CBA); d. 12/2017 Cath: LM nl, LAD 366m80d (small), D1 40ost, D2 95ost, LCX 40p, RCA 40ost/p->Med rx for D2 given restenosis.  . Colitis 06/03/2015  . Colon polyp   . CVA (cerebral vascular accident) ([facility]   Left side weakness.   . Degenerative tear of glenoid labrum of right shoulder 03/15/2017  . Diabetes mellitus without complication ([facility]  . Family history of breast cancer    BRCA neg 2014  . Femur fracture, left ([facility]  . Gastric ulcer 04/27/2011  . History of echocardiogram    a. 03/2017 Echo: EF 60-65%, no rwma; b. 02/2018 Echo: EF 60-65%, no rwma. Nl RV fxn. No cardiac source of emboli (admitted w/ stroke).  . Hypertension   . Malignant melanoma of skin of scalp ([facility]  . MI,  acute, non ST segment elevation ([facility]  . Neuromuscular disorder ([facility]  . S/P drug eluting coronary stent placement 06/04/2015  . Sepsis ([facility]/14/2019  . [facility]   a. 02/2018 MRI: 25m28mate acute/early subacute L medial frontal lobe inarct; b. 02/2018 MRA No large vessel occlusion or aneurysm. Mod to sev L P2 stenosis. thready L vertebral artery, diffusely dzs'd; c. 02/2018 Carotid U/S: <50% bilat ICA dzs.    Current Outpatient Medications  Medication Sig Dispense Refill  . alosetron (LOTRONEX) 1 MG tablet Take 1 tablet (1 mg total) by mouth 2 (two) times daily. 60 tablet 5  . ALPRAZolam (XANAX) 1 MG tablet Take 1 tablet by mouth twice daily as needed 60 tablet 5  . buPROPion (WELLBUTRIN SR) 200 MG 12 hr tablet Take 200 mg by mouth every morning.    . carvedilol (COREG) 12.5 MG tablet Take 1 tablet (12.5 mg total) by mouth 2 (two) times daily. 60 tablet 6  . clopidogrel (PLAVIX) 75 MG tablet Take 75 mg by mouth daily.    . dMarland Kitchensvenlafaxine (PRISTIQ) 50 MG 24 hr tablet Take 1 tablet (50 mg total) by mouth daily. 30 tablet 1  . diphenoxylate-atropine (LOMOTIL) 2.5-0.025 MG tablet Take 1 tablet by mouth 4 (four) times daily as needed for diarrhea or loose stools. 120 tablet 3  . EUTHYROX 25 MCG tablet TAKE 1 TABLET BY MOUTH ONCE  DAILY BEFORE BREAKFAST 90 tablet 3  . famotidine (PEPCID) 20 MG tablet Take 20 mg by mouth 2 (two) times daily.    Marland Kitchen gabapentin (NEURONTIN) 300 MG capsule Take 2 capsules (600 mg total) by mouth 2 (two) times daily. 360 capsule 4  . glucose 4 GM chewable tablet Chew 1 tablet (4 g total) by mouth 3 (three) times daily. 50 tablet 12  . isosorbide mononitrate (IMDUR) 120 MG 24 hr tablet Take 1 tablet (120 mg total) by mouth daily. 30 tablet 6  . nitroGLYCERIN (NITROSTAT) 0.4 MG SL tablet Place 1 tablet (0.4 mg total) under the tongue every 5 (five) minutes as needed for chest pain. 25 tablet 2  . Phenazopyridine HCl (AZO TABS PO) Take 2 tablets by mouth daily.    Vladimir Faster Glycol-Propyl Glycol (SYSTANE OP) Place 1 drop into both eyes daily as needed (dry eyes).    . promethazine (PHENERGAN) 25 MG tablet TAKE 1 TABLET BY MOUTH EVERY 6 HOURS AS NEEDED FOR NAUSEA FOR VOMITING 30 tablet 1  . rosuvastatin (CRESTOR) 40 MG tablet Take 1 tablet (40 mg total) by mouth daily at 6 PM. 90 tablet 3  . Ubrogepant (UBRELVY) 100 MG TABS Take by mouth.    . AJOVY 225 MG/1.5ML SOAJ Inject into the skin. (Patient not taking: Reported on 05/07/2020)    . Galcanezumab-gnlm (EMGALITY) 120 MG/ML SOAJ Inject 120 mg into the skin every 28 (twenty-eight) days. (Patient not taking: Reported on 05/07/2020)    . traZODone (DESYREL) 100 MG tablet TAKE 1 TO 2 TABLETS BY MOUTH AT BEDTIME (Patient not taking: Reported on 05/07/2020) 30 tablet 5   No current facility-administered medications for this visit.    Allergies as of 05/07/2020 - Review Complete 05/07/2020  Allergen Reaction Noted  . Lipitor [atorvastatin] Other (See Comments) 04/14/2017    ROS:  General: Negative for anorexia, weight loss, fever, chills, fatigue, weakness. ENT: Negative for hoarseness, difficulty swallowing , nasal congestion. CV: Negative for chest pain, angina, palpitations, dyspnea on exertion, peripheral edema.  Respiratory: Negative for dyspnea at rest, dyspnea on exertion, cough, sputum, wheezing.  GI: See history of present illness. GU:  Negative for dysuria, hematuria, urinary incontinence, urinary frequency, nocturnal urination.  Endo: Negative for unusual weight change.    Physical Examination:   BP 126/86 (BP Location: Left Arm, Patient Position: Sitting, Cuff Size: Normal)   Pulse (!) 118   Temp 97.9 F (36.6 C) (Oral)   Ht '5\' 3"'  (1.6 m)   Wt 146 lb 8 oz (66.5 kg)   BMI 25.95 kg/m   General: Well-nourished, well-developed in no acute distress.  Eyes: No icterus. Conjunctivae pink. Lungs: Clear to auscultation bilaterally. Non-labored. Heart: Regular rate and rhythm, no murmurs rubs or  gallops.  Abdomen: Bowel sounds are normal, nontender, nondistended, no hepatosplenomegaly or masses, no abdominal bruits or hernia , no rebound or guarding.   Extremities: No lower extremity edema. No clubbing or deformities. Neuro: Alert and oriented x 3.  Grossly intact. Skin: Warm and dry, no jaundice.   Psych: Alert and cooperative, normal mood and affect.  Labs:    Imaging Studies: ECHOCARDIOGRAM COMPLETE  Result Date: 04/24/2020    ECHOCARDIOGRAM REPORT   Patient Name:   B[place]on Date of Exam: 04/23/2020 Medical Rec #:  [id number]             Height:       63.0 in Accession #:    [id number]  Weight:       145.0 lb Date of Birth:  [birth year]             BSA:          1.687 m Patient Age:    13 years              BP:           118/58 mmHg Patient Gender: F                     HR:           68 bpm. Exam Location:  [facility] Procedure: 2D Echo, Cardiac Doppler and Color Doppler Indications:     I48.91 Atrial Fibrillation  History:         Patient has prior history of Echocardiogram examinations, most                  recent 11/06/2019. STENT; Risk Factors:Hypertension and                  Diabetes. Stroke. Myocardial infarction. Coronary artery                  disease.  Sonographer:     Wilford Sports Rodgers-Jones Referring Phys:  [id number] AGBATA Diagnosing Phys: Kate Sable MD IMPRESSIONS  1. Left ventricular ejection fraction, by estimation, is 60 to 65%. The left ventricle has normal function. The left ventricle has no regional wall motion abnormalities. Left ventricular diastolic parameters are consistent with Grade I diastolic dysfunction (impaired relaxation).  2. Right ventricular systolic function is normal. The right ventricular size is normal.  3. The mitral valve is normal in structure. No evidence of mitral valve regurgitation. No evidence of mitral stenosis.  4. The aortic valve is tricuspid. Aortic valve regurgitation is not visualized. Mild to moderate aortic  valve sclerosis/calcification is present, without any evidence of aortic stenosis. FINDINGS  Left Ventricle: Left ventricular ejection fraction, by estimation, is 60 to 65%. The left ventricle has normal function. The left ventricle has no regional wall motion abnormalities. The left ventricular internal cavity size was normal in size. There is  no left ventricular hypertrophy. Left ventricular diastolic parameters are consistent with Grade I diastolic dysfunction (impaired relaxation). Right Ventricle: The right ventricular size is normal. No increase in right ventricular wall thickness. Right ventricular systolic function is normal. Left Atrium: Left atrial size was normal in size. Right Atrium: Right atrial size was normal in size. Pericardium: There is no evidence of pericardial effusion. Mitral Valve: The mitral valve is normal in structure. No evidence of mitral valve regurgitation. No evidence of mitral valve stenosis. Tricuspid Valve: The tricuspid valve is normal in structure. Tricuspid valve regurgitation is not demonstrated. No evidence of tricuspid stenosis. Aortic Valve: The aortic valve is tricuspid. Aortic valve regurgitation is not visualized. Mild to moderate aortic valve sclerosis/calcification is present, without any evidence of aortic stenosis. Pulmonic Valve: The pulmonic valve was normal in structure. Pulmonic valve regurgitation is not visualized. No evidence of pulmonic stenosis. Aorta: The aortic root is normal in size and structure. Venous: The inferior vena cava was not well visualized. IAS/Shunts: No atrial level shunt detected by color flow Doppler.  LEFT VENTRICLE PLAX 2D LVIDd:         5.11 cm  Diastology LVIDs:         3.43 cm  LV e' medial:    6.96 cm/s LV PW:  0.93 cm  LV E/e' medial:  9.5 LV IVS:        0.84 cm  LV e' lateral:   9.14 cm/s LVOT diam:     2.30 cm  LV E/e' lateral: 7.2 LV SV:         63 LV SV Index:   37 LVOT Area:     4.15 cm  RIGHT VENTRICLE RV Basal diam:   2.93 cm RV S prime:     14.10 cm/s TAPSE (M-mode): 2.9 cm LEFT ATRIUM             Index       RIGHT ATRIUM          Index LA diam:        4.00 cm 2.37 cm/m  RA Area:     9.60 cm LA Vol (A2C):   36.9 ml 21.88 ml/m RA Volume:   19.50 ml 11.56 ml/m LA Vol (A4C):   28.9 ml 17.13 ml/m LA Biplane Vol: 33.9 ml 20.10 ml/m  AORTIC VALVE LVOT Vmax:   63.10 cm/s LVOT Vmean:  46.900 cm/s LVOT VTI:    0.152 m  AORTA Ao Root diam: 3.40 cm MITRAL VALVE MV Area (PHT): 2.03 cm    SHUNTS MV Decel Time: 373 msec    Systemic VTI:  0.15 m MV E velocity: 66.20 cm/s  Systemic Diam: 2.30 cm MV A velocity: 90.80 cm/s MV E/A ratio:  0.73 Kate Sable MD Electronically signed by Kate Sable MD Signature Date/Time: 04/24/2020/2:08:45 PM    Final    [place] EKG SITE RITE  Result Date: 04/23/2020 If Site Rite image not attached, placement could not be confirmed due to current cardiac rhythm.   Assessment and Plan:   Karys Marita [place] Tobin [name] is a 50 y.o. y/o female who comes in with chronic diarrhea.  The biopsies of the terminal ileum and random colon biopsies were negative for any inflammation.  The patient will have her blood sent off for a celiac sprue panel and she will also have a breath test for bacterial overgrowth.  The patient will be notified of the results of these and will be given a refill for her Lomotil.  The patient has been explained the plan agrees with it.     [name], MD. Marval Regal    Note: This dictation was prepared with Dragon dictation along with smaller phrase technology. Any transcriptional errors that result from this process are unintentional.

## 2020-05-08 ENCOUNTER — Ambulatory Visit: Payer: Medicare Other | Admitting: Urology

## 2020-05-08 ENCOUNTER — Encounter: Payer: Self-pay | Admitting: Urology

## 2020-05-08 ENCOUNTER — Inpatient Hospital Stay: Payer: Medicare Other | Admitting: Family Medicine

## 2020-05-08 VITALS — BP 115/70 | HR 89 | Ht 63.0 in | Wt 144.0 lb

## 2020-05-08 DIAGNOSIS — R339 Retention of urine, unspecified: Secondary | ICD-10-CM | POA: Diagnosis not present

## 2020-05-08 DIAGNOSIS — N390 Urinary tract infection, site not specified: Secondary | ICD-10-CM | POA: Diagnosis not present

## 2020-05-08 LAB — URINALYSIS, COMPLETE
Bilirubin, UA: NEGATIVE
Glucose, UA: NEGATIVE
Ketones, UA: NEGATIVE
Nitrite, UA: POSITIVE — AB
Specific Gravity, UA: 1.01 (ref 1.005–1.030)
Urobilinogen, Ur: 0.2 mg/dL (ref 0.2–1.0)
pH, UA: 5.5 (ref 5.0–7.5)

## 2020-05-08 LAB — MICROSCOPIC EXAMINATION: WBC, UA: >30 /hpf — AB (ref 0–5)

## 2020-05-08 LAB — BLADDER SCAN AMB NON-IMAGING

## 2020-05-08 MED ORDER — ESTRADIOL 0.1 MG/GM VA CREA: TOPICAL_CREAM | VAGINAL | 12 refills | Status: DC

## 2020-05-08 MED ORDER — NITROFURANTOIN MONOHYD MACRO 100 MG PO CAPS: 100.0000 mg | ORAL_CAPSULE | Freq: Two times a day (BID) | ORAL | 0 refills | Status: AC

## 2020-05-08 NOTE — Progress Notes (Signed)
Continuous Intermittent Catheterization  Due to incomplete bladder emptying patient is present today for a teaching of self I & O Catheterization. Patient was given detailed verbal and printed instructions of self catheterization. Patient was cleaned and prepped in a sterile fashion. With instruction and assistance patient inserted a 14FR and urine return was noted 300 ml, urine was yellow in color, with purulent like discharge from the urethra. Patient tolerated well, no complications were noted. Patient was given a sample bag with supplies to take home.  Instructions were given per Dr. Diamantina Providence for patient to cath nightly prior to bed.  An order was placed with [facility] for catheters to be sent to the patient's home. Patient is to follow up in 6 weeks for assessment. All questions answered.   Preformed by: Gordy Clement, CMA   Additional Notes: RTC in 6 weeks.

## 2020-05-08 NOTE — Addendum Note (Signed)
Addended by: Donalee Citrin on: 05/08/2020 01:39 PM   Modules accepted: Orders

## 2020-05-08 NOTE — Progress Notes (Signed)
05/08/20 9:44 AM   [place] 07-05-1970 [id number]  CC: Recurrent UTIs/urosepsis  HPI: I saw [name] in urology clinic today for the above issues.  She is a very comorbid 50 year old female with a history of stroke, diabetes(current hemoglobin A1c 5.2), CKD, and recurrent UTIs with recent hospitalization for sepsis from urinary source with ESBL E. coli.  She reports she feels like she does not empty her bladder all the way.  She also leaks urine overnight without realizing it.  She has some urgency during the day as well.  PVR significantly elevated at 360 mL today, and she was unable to void again.  She reports acute UTI symptoms of dysuria similar to prior UTIs today, and urinalysis is concerning for infection with greater than 30 WBCs, 3-10 RBCs, 0-10 epithelial cells, many bacteria, no yeast, nitrite positive, 3+ leukocytes.    I reviewed her recent CT from 01/25/2020 that shows no evidence of nephrolithiasis or hydronephrosis, mild bladder distention, no other urologic abnormalities.  She reportedly has tried intermittent catheterization previously but was unable to perform this.    PMH: Past Medical History:  Diagnosis Date  . Acute colitis 01/27/2017  . Acute pyelonephritis   . Anemia    iron deficiency anemia  . Aortic arch aneurysm ([place])   . BRCA negative 2014  . CAD (coronary artery disease)    a. 08/2003 Cath: LAD 30-40-med Rx; b. 11/2014 PCI: LAD 54m(3.25x23 Xience Alpine DES); c. 06/2015 PCI: D1 (2.25x12 Resolute Integrity DES); d. 06/2017 PCI: Patent mLAD stent, D2 95 (PTCA); e. 09/2017 PCI: D2 99ost (CBA); d. 12/2017 Cath: LM nl, LAD 361m80d (small), D1 40ost, D2 95ost, LCX 40p, RCA 40ost/p->Med rx for D2 given restenosis.  . Colitis 06/03/2015  . Colon polyp   . CVA (cerebral vascular accident) ([facility]   Left side weakness.   . Degenerative tear of glenoid labrum of right shoulder 03/15/2017  . Diabetes mellitus without complication ([facility]  . Family  history of breast cancer    BRCA neg 2014  . Femur fracture, left ([facility]9/10/2018  . Gastric ulcer 04/27/2011  . History of echocardiogram    a. 03/2017 Echo: EF 60-65%, no rwma; b. 02/2018 Echo: EF 60-65%, no rwma. Nl RV fxn. No cardiac source of emboli (admitted w/ stroke).  . Hypertension   . Malignant melanoma of skin of scalp ([facility]  . MI, acute, non ST segment elevation ([facility]  . Neuromuscular disorder ([facility]  . S/P drug eluting coronary stent placement 06/04/2015  . Sepsis ([facility]2/14/2019  . St[facility] ([facility]   a. 02/2018 MRI: 40m71mate acute/early subacute L medial frontal lobe inarct; b. 02/2018 MRA No large vessel occlusion or aneurysm. Mod to sev L P2 stenosis. thready L vertebral artery, diffusely dzs'd; c. 02/2018 Carotid U/S: <50% bilat ICA dzs.    Surgical History: Past Surgical History:  Procedure Laterality Date  . APPENDECTOMY    . BALLOON ENTEROSCOPY  02/06/2020   [facility]  . BIOPSY N/A 03/14/2020   Procedure: BIOPSY;  Surgeon: WohLucilla LameD;  [place] [facility];  Laterality: N/A;  . CARDIAC CATHETERIZATION N/A 11/09/2014   Procedure: Coronary Angiography;  Surgeon: TimMinna MerrittsD;  [place];  Service: Cardiovascular;  Laterality: N/A;  . CARDIAC CATHETERIZATION N/A 11/12/2014   Procedure: Coronary Stent Intervention;  Surgeon: AleIsaias CowmanD;  [facility];  Service: Cardiovascular;  Laterality:  N/A;  . CARDIAC CATHETERIZATION N/A 04/18/2015   Procedure: Left Heart Cath and Coronary Angiography;  Surgeon: Minna Merritts, MD;  Location: [place] CV LAB;  Service: Cardiovascular;  Laterality: N/A;  . CARDIAC CATHETERIZATION Left 06/04/2015   Procedure: Left Heart Cath and Coronary Angiography;  Surgeon: Wellington Hampshire, MD;  Location: [place] CV LAB;  Service: Cardiovascular;  Laterality: Left;  . CARDIAC CATHETERIZATION N/A 06/04/2015   Procedure: Coronary Stent Intervention;  Surgeon:  Wellington Hampshire, MD;  Location: [place] CV LAB;  Service: Cardiovascular;  Laterality: N/A;  . CESAREAN SECTION  2001  . CHOLECYSTECTOMY N/A 11/18/2016   Procedure: LAPAROSCOPIC CHOLECYSTECTOMY WITH INTRAOPERATIVE CHOLANGIOGRAM;  Surgeon: Christene Lye, MD;  Location: [facility];  Service: General;  Laterality: N/A;  . COLONOSCOPY WITH PROPOFOL N/A 04/27/2016   Procedure: COLONOSCOPY WITH PROPOFOL;  Surgeon: [name], MD;  Location: [place];  Service: Endoscopy;  Laterality: N/A;  . COLONOSCOPY WITH PROPOFOL N/A 01/12/2018   Procedure: COLONOSCOPY WITH PROPOFOL;  Surgeon: Toledo, Benay Pike, MD;  Location: [facility] ENDOSCOPY;  Service: Endoscopy;  Laterality: N/A;  . COLONOSCOPY WITH PROPOFOL N/A 03/14/2020   Procedure: COLONOSCOPY WITH PROPOFOL;  Surgeon: [name], MD;  Location: [place];  Service: Endoscopy;  Laterality: N/A;  Needs to be scheduled after 7:30 due to driver issues  . CORONARY ANGIOPLASTY    . CORONARY BALLOON ANGIOPLASTY N/A 06/29/2017   Procedure: CORONARY BALLOON ANGIOPLASTY;  Surgeon: Wellington Hampshire, MD;  Location: [place] CV LAB;  Service: Cardiovascular;  Laterality: N/A;  . CORONARY BALLOON ANGIOPLASTY N/A 09/20/2017   Procedure: CORONARY BALLOON ANGIOPLASTY;  Surgeon: Wellington Hampshire, MD;  Location: [place] CV LAB;  Service: Cardiovascular;  Laterality: N/A;  . CORONARY STENT INTERVENTION N/A 12/13/2019   Procedure: CORONARY STENT INTERVENTION;  Surgeon: Wellington Hampshire, MD;  Location: [place] CV LAB;  Service: Cardiovascular;  Laterality: N/A;  . DILATION AND CURETTAGE OF UTERUS    . ESOPHAGOGASTRODUODENOSCOPY (EGD) WITH PROPOFOL N/A 09/14/2014   Procedure: ESOPHAGOGASTRODUODENOSCOPY (EGD) WITH PROPOFOL;  Surgeon: Josefine Class, MD;  Location: [facility] ENDOSCOPY;  Service: Endoscopy;  Laterality: N/A;  . ESOPHAGOGASTRODUODENOSCOPY (EGD) WITH PROPOFOL N/A 04/27/2016   Procedure: ESOPHAGOGASTRODUODENOSCOPY (EGD)  WITH PROPOFOL;  Surgeon: [name], MD;  Location: [place];  Service: Endoscopy;  Laterality: N/A;  Diabetic - oral meds  . ESOPHAGOGASTRODUODENOSCOPY (EGD) WITH PROPOFOL N/A 01/12/2018   Procedure: ESOPHAGOGASTRODUODENOSCOPY (EGD) WITH PROPOFOL;  Surgeon: Toledo, Benay Pike, MD;  Location: [facility] ENDOSCOPY;  Service: Endoscopy;  Laterality: N/A;  . ESOPHAGOGASTRODUODENOSCOPY (EGD) WITH PROPOFOL N/A 04/11/2019   Procedure: ESOPHAGOGASTRODUODENOSCOPY (EGD) WITH PROPOFOL;  Surgeon: [name], MD;  Location: [facility];  Service: Endoscopy;  Laterality: N/A;  . ESOPHAGOGASTRODUODENOSCOPY (EGD) WITH PROPOFOL N/A 03/08/2020   Procedure: ESOPHAGOGASTRODUODENOSCOPY (EGD) WITH PROPOFOL;  Surgeon: [name], MD;  Location[facility];  Service: Endoscopy;  Laterality: N/A;  . GASTRIC BYPASS  09/2009   [place] (IM) NAIL INTERTROCHANTERIC Left 10/13/2018   Procedure: INTRAMEDULLARY ([place]) NAIL INTERTROCHANTRIC;  Surgeon: Leandrew Koyanagi, MD;  Location: [place];  Service: Orthopedics;  Laterality: Left;  . Left Carotid to sublcavian artery bypass w/ subclavian artery ligation     a. Performed @ [place].  . LEFT HEART CATH AND CORONARY ANGIOGRAPHY Left 06/29/2017   Procedure: LEFT HEART CATH AND CORONARY ANGIOGRAPHY;  Surgeon: Wellington Hampshire, MD;  Location: [place] CV LAB;  Service: Cardiovascular;  Laterality: Left;  .  LEFT HEART CATH AND CORONARY ANGIOGRAPHY N/A 09/20/2017   Procedure: LEFT HEART CATH AND CORONARY ANGIOGRAPHY;  Surgeon: Wellington Hampshire, MD;  Location: [place] CV LAB;  Service: Cardiovascular;  Laterality: N/A;  . LEFT HEART CATH AND CORONARY ANGIOGRAPHY N/A 12/20/2017   Procedure: LEFT HEART CATH AND CORONARY ANGIOGRAPHY;  Surgeon: Wellington Hampshire, MD;  Location: [place] CV LAB;  Service: Cardiovascular;  Laterality: N/A;  . LEFT HEART CATH AND CORONARY ANGIOGRAPHY N/A 04/20/2019   Procedure: LEFT HEART CATH AND  CORONARY ANGIOGRAPHY possible PCI;  Surgeon: Minna Merritts, MD;  Location: [place] CV LAB;  Service: Cardiovascular;  Laterality: N/A;  . LEFT HEART CATH AND CORONARY ANGIOGRAPHY N/A 12/13/2019   Procedure: LEFT HEART CATH AND CORONARY ANGIOGRAPHY;  Surgeon: Wellington Hampshire, MD;  Location: [place] CV LAB;  Service: Cardiovascular;  Laterality: N/A;  . MELANOMA EXCISION  2016   Dr. Evorn Gong  . [place]  2002  . RIGHT OOPHORECTOMY    . SHOULDER ARTHROSCOPY WITH OPEN ROTATOR CUFF REPAIR Right 01/07/2016   Procedure: SHOULDER ARTHROSCOPY WITH DEBRIDMENT, SUBACHROMIAL DECOMPRESSION;  Surgeon: Corky Mull, MD;  Location: [facility] ORS;  Service: Orthopedics;  Laterality: Right;  . SHOULDER ARTHROSCOPY WITH OPEN ROTATOR CUFF REPAIR Right 03/16/2017   Procedure: SHOULDER ARTHROSCOPY WITH OPEN ROTATOR CUFF REPAIR POSSIBLE BICEPS TENODESIS;  Surgeon: Corky Mull, MD;  Location: [facility]S;  Service: Orthopedics;  Laterality: Right;  . TRIGGER FINGER RELEASE Right     [place]   Family History: Family History  Problem Relation Age of Onset  . Hypertension Mother   . Anxiety disorder Mother   . Depression Mother   . Bipolar disorder Mother   . Heart disease Mother        No details  . Hyperlipidemia Mother   . Kidney disease Father   . Heart disease Father 3  . Hypertension Father   . Diabetes Father   . Stroke Father   . Colon cancer Father        dx in his 44's  . Anxiety disorder Father   . Depression Father   . Skin cancer Father   . Kidney disease Sister   . Thyroid nodules Sister   . Hypertension Sister   . Hypertension Sister   . Diabetes Sister   . Hyperlipidemia Sister   . Depression Sister   . Breast cancer Maternal Aunt 60  . Breast cancer Maternal Aunt 41  . Ovarian cancer Cousin   . Colon cancer Cousin   . Kidney cancer Cousin   . Breast cancer Other   . Bladder Cancer Neg Hx     Social History:  reports that she quit smoking about 25 years  ago. Her smoking use included cigarettes. She has a 0.25 pack-year smoking history. She has never used smokeless tobacco. She reports that she does not drink alcohol and does not use drugs.  Physical Exam: BP 115/70   Pulse 89   Ht _0  (1.6 m)   Wt 144 lb (65.3 kg)   BMI 25.51 kg/m    Constitutional:  Alert and oriented, No acute distress. Cardiovascular: No clubbing, cyanosis, or edema. Respiratory: Normal respiratory effort, no increased work of breathing. GI: Abdomen is soft, nontender, nondistended, no abdominal masses  Laboratory Data: Reviewed, see HPI  Pertinent Imaging: I have personally viewed and interpreted the CT abdomen pelvis from December 2021 showing no nephrolithiasis or hydronephrosis, mildly distended bladder  Assessment & Plan:  50 year old comorbid female with history of stroke and diabetes who presents with recurrent ESBL E. coli UTIs and sepsis from urinary source, most likely secondary to incomplete bladder emptying.  PVR is significantly elevated at 360 mL today, and her history is consistent with overflow incontinence overnight.  Urinalysis today grossly concerning for acute UTI.  She is very resistant to learning CIC, however she was able to perform this today with moderate difficulty.  I do think she will improve with practice.  Ideally she would catheterize 2-3 times per day, but I think realistically we should start with just nightly catheterizations which will help her urinary symptoms overnight and prevent UTIs.  We discussed the evaluation and treatment of patients with recurrent UTIs at length.  We specifically discussed the differences between asymptomatic bacteriuria and true urinary tract infection.  We discussed the AUA definition of recurrent UTI of at least 2 culture proven symptomatic acute cystitis episodes in a 85-monthperiod, or 3 within a 1 year period.  We discussed the importance of culture directed antibiotic treatment, and antibiotic  stewardship.  First-line therapy includes nitrofurantoin(5 days), Bactrim(3 days), or fosfomycin(3 g single dose).  Possible etiologies of recurrent infection include periurethral tissue atrophy in postmenopausal woman, constipation, sexual activity, incomplete emptying, anatomic abnormalities, and even genetic predisposition.  Finally, we discussed the role of perineal hygiene, timed voiding, adequate hydration, topical vaginal estrogen, cranberry prophylaxis, and low-dose antibiotic prophylaxis.  Nitrofurantoin 500 mg twice daily x5 days, then transition to 90 days of 100 mg daily prophylaxis Topical estrogen cream and cranberry tablets Catheterize nightly, continue timed voiding during the day RTC 1 to 2 months PVR, pelvic exam  I spent 60 total minutes on the day of the encounter including pre-visit review of the medical record, face-to-face time with the patient, and post visit ordering of labs/imaging/tests.   BNickolas Madrid MD 05/08/2020  [facility] [address] [place] [phone]

## 2020-05-08 NOTE — Patient Instructions (Addendum)
Use estrogen cream 3 times per week by applying a pea-sized amount to the opening of the urethra(tube that you pee through) before bed.  This can help prevent infections.  Also take cranberry tablets 1-2 times daily, as this can also prevent infection.  Take the 5-day course of antibiotics twice daily for acute UTI, then take 1 tablet daily for 90 days  If able, catheterize nightly right before bed, this will help with overnight leakage and preventing infections.  If you are unable to catheterize, it is important to try to urinate every 2 hours during the day, and twice before bedtime to empty her bladder better.    Step 1 Get all of your supplies ready and place near you. Step 2 Wash your hands or put on gloves. Step 3 Wash around urethral opening with warm antibacterial/ hypoallergenic soapy water from front to back. Step 4 take the catheter out of the package and drain the lubricant over toilet. Step 5 Sit on the toilet and spread your legs to begin catheterization. Step 6 Use your fingers to spread the labia and feel for the urethra.             A MIRROR MAY BE HELPFUL AT FIRST Step 7 Insert the catheter slowly into the urethra. If there is resistance when the catheter reaches the the sphincter muscle,              take a deep breath and gently apply steady pressure.            DO NOT FORCE THE CATHETER Step 8 When the urine begins to flow insert another inch, allow the urine to flow into the toilet. Step 9 When the flow of urine stops, slowly remove the catheter.

## 2020-05-09 ENCOUNTER — Ambulatory Visit: Payer: Self-pay | Admitting: Adult Health

## 2020-05-09 ENCOUNTER — Telehealth: Payer: Self-pay

## 2020-05-09 DIAGNOSIS — R002 Palpitations: Secondary | ICD-10-CM | POA: Diagnosis not present

## 2020-05-09 LAB — CELIAC DISEASE PANEL
Endomysial IgA: NEGATIVE
IgA/Immunoglobulin A, Serum: 245 mg/dL (ref 87–352)
Transglutaminase IgA: <2 U/mL (ref 0–3)

## 2020-05-09 NOTE — Telephone Encounter (Signed)
-----   Message from Darren Wohl, MD sent at 05/09/2020  8:16 AM EDT ----- Let the patient know the sprue test was negative. 

## 2020-05-09 NOTE — Telephone Encounter (Signed)
Pt notified of celiac sprue lab results through mychart.

## 2020-05-13 ENCOUNTER — Telehealth: Payer: Self-pay | Admitting: Cardiovascular Disease

## 2020-05-13 LAB — CULTURE, URINE COMPREHENSIVE

## 2020-05-13 NOTE — Telephone Encounter (Signed)
Spoke with patient.  Per patient [name] wanted her to keep visit to go over zio results hoping they would be ready.

## 2020-05-13 NOTE — Telephone Encounter (Signed)
-----   Message from Britt Bottom, [place] sent at 05/13/2020 10:15 AM EDT ----- [name], Pt has upcoming appointment w/ [name] 4/19. Notes say F/u [facility]. Pt was in the hospital 3/22. F/u w/ Caryl Comes 3/31 told to f/u in 6 months. Pt saw TG 02/26/20 told to f/u in 6 months. I'm not sure the reason for f/u just want to make sure the appointment is necessary. Thank you, Lenda Kelp

## 2020-05-14 ENCOUNTER — Other Ambulatory Visit: Payer: Self-pay

## 2020-05-14 DIAGNOSIS — N390 Urinary tract infection, site not specified: Secondary | ICD-10-CM

## 2020-05-14 MED ORDER — NITROFURANTOIN MONOHYD MACRO 100 MG PO CAPS: 100.0000 mg | ORAL_CAPSULE | Freq: Every day | ORAL | 0 refills | Status: DC

## 2020-05-20 ENCOUNTER — Encounter: Payer: Self-pay | Admitting: Physician Assistant

## 2020-05-20 ENCOUNTER — Other Ambulatory Visit: Payer: Self-pay

## 2020-05-20 ENCOUNTER — Ambulatory Visit (INDEPENDENT_AMBULATORY_CARE_PROVIDER_SITE_OTHER): Payer: Medicare Other | Admitting: Physician Assistant

## 2020-05-20 VITALS — BP 144/91 | HR 90 | Temp 98.1°F | Ht 63.0 in | Wt 144.0 lb

## 2020-05-20 DIAGNOSIS — N390 Urinary tract infection, site not specified: Secondary | ICD-10-CM | POA: Diagnosis not present

## 2020-05-20 LAB — MICROSCOPIC EXAMINATION: WBC, UA: >30 /hpf — AB (ref 0–5)

## 2020-05-20 LAB — URINALYSIS, COMPLETE
Bilirubin, UA: NEGATIVE
Glucose, UA: NEGATIVE
Ketones, UA: NEGATIVE
Nitrite, UA: NEGATIVE
Specific Gravity, UA: 1.015 (ref 1.005–1.030)
Urobilinogen, Ur: 0.2 mg/dL (ref 0.2–1.0)
pH, UA: 5.5 (ref 5.0–7.5)

## 2020-05-20 MED ORDER — FOSFOMYCIN TROMETHAMINE 3 G PO PACK: 3.0000 g | PACK | Freq: Once | ORAL | 0 refills | Status: AC

## 2020-05-20 NOTE — Progress Notes (Signed)
Evaluation Performed:  Follow-up visit  Date:  05/21/2020   ID:  [place], [place] 07-Dec-1970, MRN [id number]  Patient Location:  [address] [place] [address]   Provider location:   [facility], [place] office  PCP:  [name], MD  Cardiologist:  Arvid Right [facility]  Chief Complaint  Patient presents with  . Follow-up    After ZIO    History of Present Illness:    [name] is a 50 y.o. female past medical history of smoking,  strokes, 2010 chest pain dating back to 2005  CAD, Previous LAD stent Catheterization May 2017 with stent placed to her first diagonal, there was residual 50% distal LAD disease, 30% proximal, 40% proximal circumflex normal LV function,  s/p gastric bypass surgery, Has no stomach Baseline creatinine 1.5, previous ly taken off her diuretic Labile pressures GI bleed 12/21 into 02/2020, 10 units transfused Who presents for routine followup of her coronary artery disease  Reports having some abdominal and back pain Meeting with urology, UA positive, moderate bacteria backand stomach discomofrt On ABX for enterococcus, ecoli  Lab work reviewed CR 2.2, BUN 30  Feb 2022 CR 1.68  In 04/2020  Event monitor reviewed: Normal sinus rhythm Rare PVCs  Echo 04/23/2020: normal EF  Denies any chest pain concerning for angina Reports blood pressure stable, no orthostasis symptoms Tolerating high-dose isosorbide, also on carvedilol, Crestor Plavix   Other past medical history reviewed Hospital records reviewed, 12/21 to 02/2020 balloon-assisted enteroscopy formanagement of anUGI bleed. Localization with nuclear imaging and CT scan suggested the source in the bypassed stomach or proximal duodenum.   hospitalized at [facility] with GI bleed, anemia and malnutrition. She was treated with 4 units of packed red blood cells and short-term enteral nutrition  12/29: The patient  underwent a deep enteroscopy with GI, unable to reach excluded stomach. She was then scheduled for a double balloon enteroscopy on 1/4 at [facility]. 12/30: Patient experienced angina and cardiology was consulted. Echo overall reassuring, EKG with sinus tach, trop neg, restarted plavix. Had large bloody BM after plavix, so d/c'd, continue ASA. Transfused 3U PRBCs, CTA without e/o active bleeding. 12/31: CP resolved, 2 small melanotic stools o/n, Hgb stable 7.5 1/1: Hb 6.9, 2 small melanotic in last 24 hours, 1 u pRBC given + 20 lasix. Brief anginal episode.  1/2: Hb stable at 8.1 . Swelling at PICC line, TPN held, BCx drawn. Has been afebrile, no WBC, line kept in place until infection is proven otherwise 1/3: BCx x2 neg at 24hrs. 1 melatonic stool yesterday. Hgb stable 8.7 1/4: Double balloon enteroscopy did not identify bleeding source 1/5: Started cangrelor and will monitor for rebleed. Rpt CTA if rebleeds. No plan to restart TPN, remove PICC. 1/6: Hgb stable, no signs of bleeding. Stop cangrelor gtt, restart Plavix. 1/7: Hgb stable, cont Plavix + ASA. Experiencing episodes of hypoglycemia + drowsiness. DMS c/s.   Cath 12/2019 1.  Patent stents in the mid LAD and first diagonal with mild to moderate in-stent restenosis, stable moderate mid RCA stenosis, stable diffuse disease in the superior branch of the first diagonal, moderate mid LAD stenosis at the bifurcation of second diagonal which has no significant ostial disease.  There is severe progression of stenosis in the mid to distal LAD.  In addition, there is moderately severe disease in the apical LAD. 2.  Normal left ventricular end-diastolic pressure. 3.  Successful angioplasty  and drug-eluting stent placement to the mid to distal LAD  Echocardiogram October 2021 Ejection fraction 60%  Prior hospitalizations  hypoglycemia 10/2019  Cath 04/2019   Non-stenotic 1st Diag lesion was previously treated.  Ost 1st Diag lesion is 40%  stenosed.  Mid LAD-2 lesion is 5% stenosed.  Ost 2nd Diag to 2nd Diag lesion is 95% stenosed.  Mid LAD-1 lesion is 30% stenosed.  Dist LAD lesion is 80% stenosed.  Prox Cx lesion is 40% stenosed.  Mid RCA lesion is 30% stenosed.    Past Medical History:  Diagnosis Date  . Acute colitis 01/27/2017  . Acute pyelonephritis   . Anemia    iron deficiency anemia  . Aortic arch aneurysm ([place])   . BRCA negative 2014  . CAD (coronary artery disease)    a. 08/2003 Cath: LAD 30-40-med Rx; b. 11/2014 PCI: LAD 59m(3.25x23 Xience Alpine DES); c. 06/2015 PCI: D1 (2.25x12 Resolute Integrity DES); d. 06/2017 PCI: Patent mLAD stent, D2 95 (PTCA); e. 09/2017 PCI: D2 99ost (CBA); d. 12/2017 Cath: LM nl, LAD 353m80d (small), D1 40ost, D2 95ost, LCX 40p, RCA 40ost/p->Med rx for D2 given restenosis.  . Colitis 06/03/2015  . Colon polyp   . CVA (cerebral vascular accident) ([facility]   Left side weakness.   . Degenerative tear of glenoid labrum of right shoulder 03/15/2017  . Diabetes mellitus without complication ([facility]  . Family history of breast cancer    BRCA neg 2014  . Femur fracture, left (HCN[place]ah[facility]ad9/10/2018  . Gastric ulcer 04/27/2011  . History of echocardiogram    a. 03/2017 Echo: EF 60-65%, no rwma; b. 02/2018 Echo: EF 60-65%, no rwma. Nl RV fxn. No cardiac source of emboli (admitted w/ stroke).  . Hypertension   . Malignant melanoma of skin of scalp ([facility]  . MI, acute, non ST segment elevation ([facility]  . Neuromuscular disorder ([facility]  . S/P drug eluting coronary stent placement 06/04/2015  . Sepsis (HCMalden2/14/2019  . [facility]   a. 02/2018 MRI: 26m40mate acute/early subacute L medial frontal lobe inarct; b. 02/2018 MRA No large vessel occlusion or aneurysm. Mod to sev L P2 stenosis. thready L vertebral artery, diffusely dzs'd; c. 02/2018 Carotid U/S: <50% bilat ICA dzs.   Past Surgical History:  Procedure Laterality Date  . APPENDECTOMY    . BALLOON ENTEROSCOPY  02/06/2020   [facility]  . BIOPSY N/A  03/14/2020   Procedure: BIOPSY;  Surgeon: WohLucilla LameD;  [facility];  Laterality: N/A;  . CARDIAC CATHETERIZATION N/A 11/09/2014   Procedure: Coronary Angiography;  Surgeon: TimMinna MerrittsD;  Location: [facility] LAB;  Service: Cardiovascular;  Laterality: N/A;  . CARDIAC CATHETERIZATION N/A 11/12/2014   Procedure: Coronary Stent Intervention;  Surgeon: AleIsaias CowmanD;  Location: [facility];  Service: Cardiovascular;  Laterality: N/A;  . CARDIAC CATHETERIZATION N/A 04/18/2015   Procedure: Left Heart Cath and Coronary Angiography;  Surgeon: TimMinna MerrittsD;  Location: [facility] LAB;  Service: Cardiovascular;  Laterality: N/A;  . CARDIAC CATHETERIZATION Left 06/04/2015   Procedure: Left Heart Cath and Coronary Angiography;  Surgeon: MuhWellington HampshireD;  Location: [facility];  Service: Cardiovascular;  Laterality: Left;  . CARDIAC CATHETERIZATION N/A 06/04/2015   Procedure: Coronary Stent Intervention;  Surgeon: MuhWellington HampshireD;  Location: [facility] [facility];  Service: Cardiovascular;  Laterality: N/A;  . CESAREAN SECTION  2001  .  CHOLECYSTECTOMY N/A 11/18/2016   Procedure: LAPAROSCOPIC CHOLECYSTECTOMY WITH INTRAOPERATIVE CHOLANGIOGRAM;  Surgeon: Christene Lye, MD;  Location[facility] OR[facility];  Service: General;  Laterality: N/A;  . COLONOSCOPY WITH PROPOFOL N/A 04/27/2016   Procedure: COLONOSCOPY WITH PROPOFOL;  Surgeon: [name], MD;  Location: [place];  Service: Endoscopy;  Laterality: N/A;  . COLONOSCOPY WITH PROPOFOL N/A 01/12/2018   Procedure: COLONOSCOPY WITH PROPOFOL;  Surgeon: Toledo, Benay Pike, MD;  Location: [facility] ENDOSCOPY;  Service: Endoscopy;  Laterality: N/A;  . COLONOSCOPY WITH PROPOFOL N/A 03/14/2020   Procedure: COLONOSCOPY WITH PROPOFOL;  Surgeon: [name], MD;  Location: [place];  Service: Endoscopy;  Laterality: N/A;  Needs to be scheduled after 7:30 due  to driver issues  . CORONARY ANGIOPLASTY    . CORONARY BALLOON ANGIOPLASTY N/A 06/29/2017   Procedure: CORONARY BALLOON ANGIOPLASTY;  Surgeon: Wellington Hampshire, MD;  Location: [place] CV LAB;  Service: Cardiovascular;  Laterality: N/A;  . CORONARY BALLOON ANGIOPLASTY N/A 09/20/2017   Procedure: CORONARY BALLOON ANGIOPLASTY;  Surgeon: Wellington Hampshire, MD;  Location: [place] CV LAB;  Service: Cardiovascular;  Laterality: N/A;  . CORONARY STENT INTERVENTION N/A 12/13/2019   Procedure: CORONARY STENT INTERVENTION;  Surgeon: Wellington Hampshire, MD;  Location: [place] CV LAB;  Service: Cardiovascular;  Laterality: N/A;  . DILATION AND CURETTAGE OF UTERUS    . ESOPHAGOGASTRODUODENOSCOPY (EGD) WITH PROPOFOL N/A 09/14/2014   Procedure: ESOPHAGOGASTRODUODENOSCOPY (EGD) WITH PROPOFOL;  Surgeon: Josefine Class, MD;  Location[facility] ENDOSCOPY;  Service: Endoscopy;  Laterality: N/A;  . ESOPHAGOGASTRODUODENOSCOPY (EGD) WITH PROPOFOL N/A 04/27/2016   Procedure: ESOPHAGOGASTRODUODENOSCOPY (EGD) WITH PROPOFOL;  Surgeon: [name], MD;  Location: [place];  Service: Endoscopy;  Laterality: N/A;  Diabetic - oral meds  . ESOPHAGOGASTRODUODENOSCOPY (EGD) WITH PROPOFOL N/A 01/12/2018   Procedure: ESOPHAGOGASTRODUODENOSCOPY (EGD) WITH PROPOFOL;  Surgeon: Toledo, Benay Pike, MD;  Location: [facility] ENDOSCOPY;  Service: Endoscopy;  Laterality: N/A;  . ESOPHAGOGASTRODUODENOSCOPY (EGD) WITH PROPOFOL N/A 04/11/2019   Procedure: ESOPHAGOGASTRODUODENOSCOPY (EGD) WITH PROPOFOL;  Surgeon: [name], MD;  Location: [facility] ENDOSCOPY;  Service: Endoscopy;  Laterality: N/A;  . ESOPHAGOGASTRODUODENOSCOPY (EGD) WITH PROPOFOL N/A 03/08/2020   Procedure: ESOPHAGOGASTRODUODENOSCOPY (EGD) WITH PROPOFOL;  Surgeon: [name], MD;  Location: [facility] ENDOSCOPY;  Service: Endoscopy;  Laterality: N/A;  . GASTRIC BYPASS  09/2009   [place] (IM) NAIL INTERTROCHANTERIC Left 10/13/2018    Procedure: INTRAMEDULLARY (IM) NAIL INTERTROCHANTRIC;  Surgeon: Leandrew Koyanagi, MD;  Location: [place];  Service: Orthopedics;  Laterality: Left;  . Left Carotid to sublcavian artery bypass w/ subclavian artery ligation     a. Performed @ [facility].  . LEFT HEART CATH AND CORONARY ANGIOGRAPHY Left 06/29/2017   Procedure: LEFT HEART CATH AND CORONARY ANGIOGRAPHY;  Surgeon: Wellington Hampshire, MD;  Location: [place] CV LAB;  Service: Cardiovascular;  Laterality: Left;  . LEFT HEART CATH AND CORONARY ANGIOGRAPHY N/A 09/20/2017   Procedure: LEFT HEART CATH AND CORONARY ANGIOGRAPHY;  Surgeon: Wellington Hampshire, MD;  Location: [place] CV LAB;  Service: Cardiovascular;  Laterality: N/A;  . LEFT HEART CATH AND CORONARY ANGIOGRAPHY N/A 12/20/2017   Procedure: LEFT HEART CATH AND CORONARY ANGIOGRAPHY;  Surgeon: Wellington Hampshire, MD;  Location: [place] CV LAB;  Service: Cardiovascular;  Laterality: N/A;  . LEFT HEART CATH AND CORONARY ANGIOGRAPHY N/A 04/20/2019   Procedure: LEFT HEART CATH AND CORONARY ANGIOGRAPHY possible PCI;  Surgeon: Minna Merritts, MD;  Location: [place] CV  LAB;  Service: Cardiovascular;  Laterality: N/A;  . LEFT HEART CATH AND CORONARY ANGIOGRAPHY N/A 12/13/2019   Procedure: LEFT HEART CATH AND CORONARY ANGIOGRAPHY;  Surgeon: Wellington Hampshire, MD;  Location: [place] CV LAB;  Service: Cardiovascular;  Laterality: N/A;  . MELANOMA EXCISION  2016   Dr. Evorn Gong  . [place]  2002  . RIGHT OOPHORECTOMY    . SHOULDER ARTHROSCOPY WITH OPEN ROTATOR CUFF REPAIR Right 01/07/2016   Procedure: SHOULDER ARTHROSCOPY WITH DEBRIDMENT, SUBACHROMIAL DECOMPRESSION;  Surgeon: Corky Mull, MD;  Location: [facility] ORS;  Service: Orthopedics;  Laterality: Right;  . SHOULDER ARTHROSCOPY WITH OPEN ROTATOR CUFF REPAIR Right 03/16/2017   Procedure: SHOULDER ARTHROSCOPY WITH OPEN ROTATOR CUFF REPAIR POSSIBLE BICEPS TENODESIS;  Surgeon: Corky Mull, MD;  Location: [facility] ORS;  Service:  Orthopedics;  Laterality: Right;  . TRIGGER FINGER RELEASE Right     Middle Finger      Allergies:   Lipitor [atorvastatin]   Social History   Tobacco Use  . Smoking status: Former Smoker    Packs/day: 0.25    Years: 1.00    Pack years: 0.25    Types: Cigarettes    Quit date: 08/31/1994    Years since quitting: 25.7  . Smokeless tobacco: Never Used  . Tobacco comment: quit 28 years ago  Vaping Use  . Vaping Use: Never used  Substance Use Topics  . Alcohol use: No    Alcohol/week: 0.0 standard drinks  . Drug use: No     Current Outpatient Medications on File Prior to Visit  Medication Sig Dispense Refill  . alosetron (LOTRONEX) 1 MG tablet Take 1 tablet (1 mg total) by mouth 2 (two) times daily. 60 tablet 5  . ALPRAZolam (XANAX) 1 MG tablet Take 1 tablet by mouth twice daily as needed 60 tablet 5  . buPROPion (WELLBUTRIN SR) 100 MG 12 hr tablet SMARTSIG:1 Tablet(s) By Mouth Every 12 Hours    . carvedilol (COREG) 12.5 MG tablet Take 1 tablet (12.5 mg total) by mouth 2 (two) times daily. 60 tablet 6  . clopidogrel (PLAVIX) 75 MG tablet Take 75 mg by mouth daily.    Marland Kitchen desvenlafaxine (PRISTIQ) 50 MG 24 hr tablet Take 1 tablet (50 mg total) by mouth daily. 30 tablet 1  . diphenoxylate-atropine (LOMOTIL) 2.5-0.025 MG tablet Take 1 tablet by mouth 4 (four) times daily as needed for diarrhea or loose stools. 120 tablet 5  . Erenumab-aooe 140 MG/ML SOAJ Inject into the skin.    Marland Kitchen estradiol (ESTRACE) 0.1 MG/GM vaginal cream Estrogen Cream Instruction Discard applicator Apply pea sized amount to tip of finger to urethra before bed. Wash hands well after application. Use Monday, Wednesday and Friday 42.5 g 12  . EUTHYROX 25 MCG tablet TAKE 1 TABLET BY MOUTH ONCE DAILY BEFORE BREAKFAST 90 tablet 3  . famotidine (PEPCID) 20 MG tablet Take 20 mg by mouth 2 (two) times daily.    Marland Kitchen gabapentin (NEURONTIN) 300 MG capsule Take 2 capsules (600 mg total) by mouth 2 (two) times daily. 360 capsule 4   . glucose 4 GM chewable tablet Chew 1 tablet (4 g total) by mouth 3 (three) times daily. 50 tablet 12  . isosorbide mononitrate (IMDUR) 120 MG 24 hr tablet Take 1 tablet (120 mg total) by mouth daily. 30 tablet 6  . memantine (NAMENDA) 5 MG tablet Take 5 mg by mouth 2 (two) times daily.    . nitrofurantoin, macrocrystal-monohydrate, (MACROBID) 100 MG capsule Take 1 capsule (  100 mg total) by mouth daily. For 3 months. 90 capsule 0  . nitroGLYCERIN (NITROSTAT) 0.4 MG SL tablet Place 1 tablet (0.4 mg total) under the tongue every 5 (five) minutes as needed for chest pain. 25 tablet 2  . Phenazopyridine HCl (AZO TABS PO) Take 2 tablets by mouth daily.    Vladimir Faster Glycol-Propyl Glycol (SYSTANE OP) Place 1 drop into both eyes daily as needed (dry eyes).    . promethazine (PHENERGAN) 25 MG tablet TAKE 1 TABLET BY MOUTH EVERY 6 HOURS AS NEEDED FOR NAUSEA FOR VOMITING 30 tablet 1  . rosuvastatin (CRESTOR) 40 MG tablet Take 1 tablet (40 mg total) by mouth daily at 6 PM. 90 tablet 3  . Ubrogepant (UBRELVY) 100 MG TABS Take by mouth.     No current facility-administered medications on file prior to visit.     Family Hx: The patient's family history includes Anxiety disorder in her father and mother; Bipolar disorder in her mother; Breast cancer in an other family member; Breast cancer (age of onset: 51) in her maternal aunt and maternal aunt; Colon cancer in her cousin and father; Depression in her father, mother, and sister; Diabetes in her father and sister; Heart disease in her mother; Heart disease (age of onset: 42) in her father; Hyperlipidemia in her mother and sister; Hypertension in her father, mother, sister, and sister; Kidney cancer in her cousin; Kidney disease in her father and sister; Ovarian cancer in her cousin; Skin cancer in her father; Stroke in her father; Thyroid nodules in her sister. There is no history of Bladder Cancer.  ROS:   Please see the history of present illness.     Review of Systems  Constitutional: Negative.        Abdominal and back pain  HENT: Negative.   Respiratory: Negative.   Cardiovascular: Negative.   Gastrointestinal: Negative.   Musculoskeletal: Negative.   Neurological: Negative.   Psychiatric/Behavioral: Negative.   All other systems reviewed and are negative.   Labs/Other Tests and Data Reviewed:    Recent Labs: 01/09/2020: BNP 64.6 03/09/2020: Magnesium 1.8 04/23/2020: ALT 23; TSH 1.600 04/24/2020: BUN 24; Creatinine, Ser 1.68; Hemoglobin 8.8; Platelets 180; Potassium 5.1; Sodium 142   Recent Lipid Panel Lab Results  Component Value Date/Time   CHOL 94 11/06/2019 05:19 AM   CHOL 159 05/28/2016 12:12 PM   CHOL 197 12/30/2013 03:55 AM   TRIG 84 11/06/2019 05:19 AM   TRIG 261 (H) 12/30/2013 03:55 AM   HDL 29 (L) 11/06/2019 05:19 AM   HDL 34 (L) 05/28/2016 12:12 PM   HDL 28 (L) 12/30/2013 03:55 AM   CHOLHDL 3.2 11/06/2019 05:19 AM   LDLCALC 48 11/06/2019 05:19 AM   LDLCALC 103 (H) 12/08/2016 12:21 PM   LDLCALC 117 (H) 12/30/2013 03:55 AM   LDLDIRECT 83.3 01/26/2011 09:13 AM    Wt Readings from Last 3 Encounters:  05/21/20 147 lb (66.7 kg)  05/20/20 144 lb (65.3 kg)  05/08/20 144 lb (65.3 kg)     Exam:    Vital Signs: Vital signs may also be detailed in the HPI BP (!) 132/94   Pulse 92   Ht '5\' 3"'  (1.6 m)   Wt 147 lb (66.7 kg)   BMI 26.04 kg/m   Wt Readings from Last 3 Encounters:  05/21/20 147 lb (66.7 kg)  05/20/20 144 lb (65.3 kg)  05/08/20 144 lb (65.3 kg)   Temp Readings from Last 3 Encounters:  05/20/20 98.1 F (36.7 C) (Oral)  05/07/20 97.9 F (36.6 C) (Oral)  04/25/20 97.6 F (36.4 C)   BP Readings from Last 3 Encounters:  05/21/20 (!) 132/94  05/20/20 (!) 144/91  05/08/20 115/70   Pulse Readings from Last 3 Encounters:  05/21/20 92  05/20/20 90  05/08/20 89     Constitutional:  oriented to person, place, and time. No distress.  HENT:  Head: Grossly normal Eyes:  no discharge. No  scleral icterus.  Neck: No JVD, no carotid bruits  Cardiovascular: Regular rate and rhythm, no murmurs appreciated Pulmonary/Chest: Clear to auscultation bilaterally, no wheezes or rails Abdominal: Soft.  no distension.  no tenderness.  Musculoskeletal: Normal range of motion Neurological:  normal muscle tone. Coordination normal. No atrophy Skin: Skin warm and dry Psychiatric: normal affect, pleasant   ASSESSMENT & PLAN:    Problem List Items Addressed This Visit      Cardiology Problems   CAD (coronary artery disease) - Primary   Essential hypertension   Hypotension   Ischemic cardiomyopathy   Carotid stenosis    Other Visit Diagnoses    Chronic systolic CHF (congestive heart failure) (HCC)       Peripheral arterial disease (HCC)       Hyperlipidemia LDL goal <70         Coronary artery disease with stable angina Currently with no symptoms of angina. No further workup at this time. Continue current medication regimen.  Urinary tract infection Receiving treatment from urology, on antibiotics Enterococcus, E. Coli Possibly contributing to abdominal and back pain and some confusion  History of stroke Continue  Plavix BP well controlled  GI bleed: Followed by Dr. Allen Norris Diarrhea improved  Weight loss: Weight stable, discussed importance of not missing meals  Anemia: Number stable, on Plavix   Total encounter time more than 25 minutes  Greater than 50% was spent in counseling and coordination of care with the patient    Signed, Ida Rogue, [place] [address], [place], [place] [address]

## 2020-05-20 NOTE — Patient Instructions (Signed)
Your medication was sent to the [facility] as well as the [facility] pharmacy if the cost is too much.

## 2020-05-20 NOTE — Progress Notes (Signed)
05/20/2020 3:30 PM   [name] September 27, 1970 [id number]  CC: Chief Complaint  Patient presents with  . Recurrent UTI   HPI: [name] is a 50 y.o. female with PMH stroke, diabetes, CKD, incomplete bladder emptying, and recurrent UTI with a recent episode of urosepsis who presents today for evaluation of possible UTI.   She was seen in clinic most recently by Dr. Diamantina Providence on 05/08/2020 for evaluation of her recurrent urinary infections with symptoms of active infection at the time.  She was treated with Macrobid 100 mg twice daily x5 days and then transition to daily suppressive Macrobid.  She was also started on topical vaginal estrogen cream and cranberry tablets and instructed to CIC nightly and continue timed voiding during the day to keep her residuals low.  Urine culture ultimately grew tetracycline resistant E faecalis and ESBL E. coli, both of which were susceptible to nitrofurantoin.  Today she reports her dysuria never improved on culture appropriate Macrobid and in the last 1 to 2 days has been accompanied by low back and lower abdominal pain.  She has nausea at baseline but denies fever and vomiting.  She has been applying topical vaginal estrogen cream 3 times weekly as instructed.  She has been self catheterizing 1-2 times daily.  She is not timed voiding and instead waits for the urge before voiding.  She has been tolerating daily Macrobid.  In-office UA today positive for trace intact blood, 1+ protein, and 1+ leukocyte esterase; urine microscopy with >30 WBCs/HPF, 3-10 RBCs/HPF, and moderate bacteria.  PMH: Past Medical History:  Diagnosis Date  . Acute colitis 01/27/2017  . Acute pyelonephritis   . Anemia    iron deficiency anemia  . Aortic arch aneurysm ([place])   . BRCA negative 2014  . CAD (coronary artery disease)    a. 08/2003 Cath: LAD 30-40-med Rx; b. 11/2014 PCI: LAD 55m(3.25x23 Xience Alpine DES); c. 06/2015 PCI: D1 (2.25x12 Resolute  Integrity DES); d. 06/2017 PCI: Patent mLAD stent, D2 95 (PTCA); e. 09/2017 PCI: D2 99ost (CBA); d. 12/2017 Cath: LM nl, LAD 366m80d (small), D1 40ost, D2 95ost, LCX 40p, RCA 40ost/p->Med rx for D2 given restenosis.  . Colitis 06/03/2015  . Colon polyp   . CVA (cerebral vascular accident) ([facility]   Left side weakness.   . Degenerative tear of glenoid labrum of right shoulder 03/15/2017  . Diabetes mellitus without complication ([facility]  . Family history of breast cancer    BRCA neg 2014  . Femur fracture, left ([facility]9/10/2018  . Gastric ulcer 04/27/2011  . History of echocardiogram    a. 03/2017 Echo: EF 60-65%, no rwma; b. 02/2018 Echo: EF 60-65%, no rwma. Nl RV fxn. No cardiac source of emboli (admitted w/ stroke).  . Hypertension   . Malignant melanoma of skin of scalp ([facility]  . MI, acute, non ST segment elevation ([facility]  . Neuromuscular disorder ([facility]  . S/P drug eluting coronary stent placement 06/04/2015  . Sepsis ([facility]2/14/2019  . St[facility] ([facility]   a. 02/2018 MRI: 49m75mate acute/early subacute L medial frontal lobe inarct; b. 02/2018 MRA No large vessel occlusion or aneurysm. Mod to sev L P2 stenosis. thready L vertebral artery, diffusely dzs'd; c. 02/2018 Carotid U/S: <50% bilat ICA dzs.    Surgical History: Past Surgical History:  Procedure Laterality Date  . APPENDECTOMY    . BALLOON ENTEROSCOPY  02/06/2020   [facility]  . BIOPSY N/A  03/14/2020   Procedure: BIOPSY;  Surgeon: [name], MD;  Location: [place];  Service: Endoscopy;  Laterality: N/A;  . CARDIAC CATHETERIZATION N/A 11/09/2014   Procedure: Coronary Angiography;  Surgeon: Minna Merritts, MD;  Location: [place] CV [facility];  Service: Cardiovascular;  Laterality: N/A;  . CARDIAC CATHETERIZATION N/A 11/12/2014   Procedure: Coronary Stent Intervention;  Surgeon: Isaias Cowman, MD;  Location: [place] [facility];  Service: Cardiovascular;  Laterality: N/A;  . CARDIAC CATHETERIZATION N/A 04/18/2015   Procedure:  Left Heart Cath and Coronary Angiography;  Surgeon: Minna Merritts, MD;  Location: [place] [facility];  Service: Cardiovascular;  Laterality: N/A;  . CARDIAC CATHETERIZATION Left 06/04/2015   Procedure: Left Heart Cath and Coronary Angiography;  Surgeon: Wellington Hampshire, MD;  Location: [place] [facility];  Service: Cardiovascular;  Laterality: Left;  . CARDIAC CATHETERIZATION N/A 06/04/2015   Procedure: Coronary Stent Intervention;  Surgeon: Wellington Hampshire, MD;  Location: [place] [facility];  Service: Cardiovascular;  Laterality: N/A;  . CESAREAN SECTION  2001  . CHOLECYSTECTOMY N/A 11/18/2016   Procedure: LAPAROSCOPIC CHOLECYSTECTOMY WITH INTRAOPERATIVE CHOLANGIOGRAM;  Surgeon: Christene Lye, MD;  Location: [facility];  Service: General;  Laterality: N/A;  . COLONOSCOPY WITH PROPOFOL N/A 04/27/2016   Procedure: COLONOSCOPY WITH PROPOFOL;  Surgeon: [name], MD;  Location: [place];  Service: Endoscopy;  Laterality: N/A;  . COLONOSCOPY WITH PROPOFOL N/A 01/12/2018   Procedure: COLONOSCOPY WITH PROPOFOL;  Surgeon: Toledo, Benay Pike, MD;  Location: [facility];  Service: Endoscopy;  Laterality: N/A;  . COLONOSCOPY WITH PROPOFOL N/A 03/14/2020   Procedure: COLONOSCOPY WITH PROPOFOL;  Surgeon: [name], MD;  Location: [place];  Service: Endoscopy;  Laterality: N/A;  Needs to be scheduled after 7:30 due to driver issues  . CORONARY ANGIOPLASTY    . CORONARY BALLOON ANGIOPLASTY N/A 06/29/2017   Procedure: CORONARY BALLOON ANGIOPLASTY;  Surgeon: Wellington Hampshire, MD;  Location: [place] CV LAB;  Service: Cardiovascular;  Laterality: N/A;  . CORONARY BALLOON ANGIOPLASTY N/A 09/20/2017   Procedure: CORONARY BALLOON ANGIOPLASTY;  Surgeon: Wellington Hampshire, MD;  Location: [place] CV LAB;  Service: Cardiovascular;  Laterality: N/A;  . CORONARY STENT INTERVENTION N/A 12/13/2019   Procedure: CORONARY STENT INTERVENTION;  Surgeon: Wellington Hampshire, MD;   Location: [name] CV [facility];  Service: Cardiovascular;  Laterality: N/A;  . DILATION AND CURETTAGE OF UTERUS    . ESOPHAGOGASTRODUODENOSCOPY (EGD) WITH PROPOFOL N/A 09/14/2014   Procedure: ESOPHAGOGASTRODUODENOSCOPY (EGD) WITH PROPOFOL;  Surgeon: Josefine Class, MD;  [facility];  Service: Endoscopy;  Laterality: N/A;  . ESOPHAGOGASTRODUODENOSCOPY (EGD) WITH PROPOFOL N/A 04/27/2016   Procedure: ESOPHAGOGASTRODUODENOSCOPY (EGD) WITH PROPOFOL;  Surgeon: [name], MD;  Location: [place];  Service: Endoscopy;  Laterality: N/A;  Diabetic - oral meds  . ESOPHAGOGASTRODUODENOSCOPY (EGD) WITH PROPOFOL N/A 01/12/2018   Procedure: ESOPHAGOGASTRODUODENOSCOPY (EGD) WITH PROPOFOL;  Surgeon: [place], Benay Pike, MD;  Location: [facility];  Service: Endoscopy;  Laterality: N/A;  . ESOPHAGOGASTRODUODENOSCOPY (EGD) WITH PROPOFOL N/A 04/11/2019   Procedure: ESOPHAGOGASTRODUODENOSCOPY (EGD) WITH PROPOFOL;  Surgeon: [name], MD;  Location: [facility];  Service: Endoscopy;  Laterality: N/A;  . ESOPHAGOGASTRODUODENOSCOPY (EGD) WITH PROPOFOL N/A 03/08/2020   Procedure: ESOPHAGOGASTRODUODENOSCOPY (EGD) WITH PROPOFOL;  Surgeon: [name], MD;  Location: [facility] END[facility]COPY;  Service: Endoscopy;  Laterality: N/A;  . GASTRIC BYPASS  09/2009   [place] (IM) NAIL INTERTROCHANTERIC Left 10/13/2018   Procedure:  INTRAMEDULLARY (IM) NAIL INTERTROCHANTRIC;  Surgeon: Leandrew Koyanagi, MD;  Location: [place];  Service: Orthopedics;  Laterality: Left;  . Left Carotid to sublcavian artery bypass w/ subclavian artery ligation     a. Performed @ [facility].  . LEFT HEART CATH AND CORONARY ANGIOGRAPHY Left 06/29/2017   Procedure: LEFT HEART CATH AND CORONARY ANGIOGRAPHY;  Surgeon: Wellington Hampshire, MD;  Location: [place] CV LAB;  Service: Cardiovascular;  Laterality: Left;  . LEFT HEART CATH AND CORONARY ANGIOGRAPHY N/A 09/20/2017   Procedure: LEFT HEART CATH AND  CORONARY ANGIOGRAPHY;  Surgeon: Wellington Hampshire, MD;  Location: [place] CV [facility];  Service: Cardiovascular;  Laterality: N/A;  . LEFT HEART CATH AND CORONARY ANGIOGRAPHY N/A 12/20/2017   Procedure: LEFT HEART CATH AND CORONARY ANGIOGRAPHY;  Surgeon: Wellington Hampshire, MD;  Location: [place] CV LAB;  Service: Cardiovascular;  Laterality: N/A;  . LEFT HEART CATH AND CORONARY ANGIOGRAPHY N/A 04/20/2019   Procedure: LEFT HEART CATH AND CORONARY ANGIOGRAPHY possible PCI;  Surgeon: Minna Merritts, MD;  Location: [place] CV LAB;  Service: Cardiovascular;  Laterality: N/A;  . LEFT HEART CATH AND CORONARY ANGIOGRAPHY N/A 12/13/2019   Procedure: LEFT HEART CATH AND CORONARY ANGIOGRAPHY;  Surgeon: Wellington Hampshire, MD;  Location: [place] CV LAB;  Service: Cardiovascular;  Laterality: N/A;  . MELANOMA EXCISION  2016   Dr. Evorn Gong  . [place]  2002  . RIGHT OOPHORECTOMY    . SHOULDER ARTHROSCOPY WITH OPEN ROTATOR CUFF REPAIR Right 01/07/2016   Procedure: SHOULDER ARTHROSCOPY WITH DEBRIDMENT, SUBACHROMIAL DECOMPRESSION;  Surgeon: Corky Mull, MD;  Location: [facility];  Service: Orthopedics;  Laterality: Right;  . SHOULDER ARTHROSCOPY WITH OPEN ROTATOR CUFF REPAIR Right 03/16/2017   Procedure: SHOULDER ARTHROSCOPY WITH OPEN ROTATOR CUFF REPAIR POSSIBLE BICEPS TENODESIS;  Surgeon: Corky Mull, MD;  Location: [facility];  Service: Orthopedics;  Laterality: Right;  . TRIGGER FINGER RELEASE Right     Middle Finger    Home Medications:  Allergies as of 05/20/2020      Reactions   Lipitor [atorvastatin] Other (See Comments)   Leg pains      Medication List       Accurate as of May 20, 2020  3:30 PM. If you have any questions, ask your nurse or doctor.        alosetron 1 MG tablet Commonly known as: Lotronex Take 1 tablet (1 mg total) by mouth 2 (two) times daily.   ALPRAZolam 1 MG tablet Commonly known as: XANAX Take 1 tablet by mouth twice daily as needed   AZO  TABS PO Take 2 tablets by mouth daily.   buPROPion 100 MG 12 hr tablet Commonly known as: WELLBUTRIN SR SMARTSIG:1 Tablet(s) By Mouth Every 12 Hours What changed: Another medication with the same name was removed. Continue taking this medication, and follow the directions you see here. Changed by: Debroah Loop, PA-C   carvedilol 12.5 MG tablet Commonly known as: COREG Take 1 tablet (12.5 mg total) by mouth 2 (two) times daily.   clopidogrel 75 MG tablet Commonly known as: PLAVIX Take 75 mg by mouth daily.   desvenlafaxine 50 MG 24 hr tablet Commonly known as: PRISTIQ Take 1 tablet (50 mg total) by mouth daily.   diphenoxylate-atropine 2.5-0.025 MG tablet Commonly known as: Lomotil Take 1 tablet by mouth 4 (four) times daily as needed for diarrhea or loose stools.   Erenumab-aooe 140 MG/ML Soaj Inject into the skin.   estradiol 0.1  MG/GM vaginal cream Commonly known as: ESTRACE Estrogen Cream Instruction Discard applicator Apply pea sized amount to tip of finger to urethra before bed. Wash hands well after application. Use Monday, Wednesday and Friday   Euthyrox 25 MCG tablet Generic drug: levothyroxine TAKE 1 TABLET BY MOUTH ONCE DAILY BEFORE BREAKFAST   famotidine 20 MG tablet Commonly known as: PEPCID Take 20 mg by mouth 2 (two) times daily.   gabapentin 300 MG capsule Commonly known as: NEURONTIN Take 2 capsules (600 mg total) by mouth 2 (two) times daily.   glucose 4 GM chewable tablet Chew 1 tablet (4 g total) by mouth 3 (three) times daily.   isosorbide mononitrate 120 MG 24 hr tablet Commonly known as: IMDUR Take 1 tablet (120 mg total) by mouth daily.   memantine 5 MG tablet Commonly known as: NAMENDA Take 5 mg by mouth 2 (two) times daily.   nitrofurantoin (macrocrystal-monohydrate) 100 MG capsule Commonly known as: MACROBID Take 1 capsule (100 mg total) by mouth daily. For 3 months.   nitroGLYCERIN 0.4 MG SL tablet Commonly known as:  NITROSTAT Place 1 tablet (0.4 mg total) under the tongue every 5 (five) minutes as needed for chest pain.   promethazine 25 MG tablet Commonly known as: PHENERGAN TAKE 1 TABLET BY MOUTH EVERY 6 HOURS AS NEEDED FOR NAUSEA FOR VOMITING   rosuvastatin 40 MG tablet Commonly known as: CRESTOR Take 1 tablet (40 mg total) by mouth daily at 6 PM.   SYSTANE OP Place 1 drop into both eyes daily as needed (dry eyes).   Ubrelvy 100 MG Tabs Generic drug: Ubrogepant Take by mouth.       Allergies:  Allergies  Allergen Reactions  . Lipitor [Atorvastatin] Other (See Comments)    Leg pains    Family History: Family History  Problem Relation Age of Onset  . Hypertension Mother   . Anxiety disorder Mother   . Depression Mother   . Bipolar disorder Mother   . Heart disease Mother        No details  . Hyperlipidemia Mother   . Kidney disease Father   . Heart disease Father 49  . Hypertension Father   . Diabetes Father   . Stroke Father   . Colon cancer Father        dx in his 22's  . Anxiety disorder Father   . Depression Father   . Skin cancer Father   . Kidney disease Sister   . Thyroid nodules Sister   . Hypertension Sister   . Hypertension Sister   . Diabetes Sister   . Hyperlipidemia Sister   . Depression Sister   . Breast cancer Maternal Aunt 70  . Breast cancer Maternal Aunt 49  . Ovarian cancer Cousin   . Colon cancer Cousin   . Kidney cancer Cousin   . Breast cancer Other   . Bladder Cancer Neg Hx     Social History:   reports that she quit smoking about 25 years ago. Her smoking use included cigarettes. She has a 0.25 pack-year smoking history. She has never used smokeless tobacco. She reports that she does not drink alcohol and does not use drugs.  Physical Exam: BP (!) 144/91 (BP Location: Left Arm, Patient Position: Sitting, Cuff Size: Normal)   Pulse 90   Ht _0  (1.6 m)   Wt 144 lb (65.3 kg)   BMI 25.51 kg/m   Constitutional:  Alert and oriented,  no acute distress, nontoxic appearing HEENT: [place],  AT Cardiovascular: No clubbing, cyanosis, or edema Respiratory: Normal respiratory effort, no increased work of breathing Skin: No rashes, bruises or suspicious lesions Neurologic: Grossly intact, no focal deficits, moving all 4 extremities Psychiatric: Normal mood and affect  Laboratory Data: Results for orders placed or performed in visit on 05/20/20  Mycoplasma / ureaplasma culture   Specimen: Genital   UR  Result Value Ref Range   Ureaplasma urealyticum Comment Negative   Mycoplasma hominis Culture Comment Negative  Microscopic Examination   Urine  Result Value Ref Range   WBC, UA >30 (A) 0 - 5 /hpf   RBC 3-10 (A) 0 - 2 /hpf   Epithelial Cells (non renal) 0-10 0 - 10 /hpf   Bacteria, UA Moderate (A) None seen/Few  Urinalysis, Complete  Result Value Ref Range   Specific Gravity, UA 1.015 1.005 - 1.030   pH, UA 5.5 5.0 - 7.5   Color, UA Yellow Yellow   Appearance Ur Cloudy (A) Clear   Leukocytes,UA 1+ (A) Negative   Protein,UA 1+ (A) Negative/Trace   Glucose, UA Negative Negative   Ketones, UA Negative Negative   RBC, UA Trace (A) Negative   Bilirubin, UA Negative Negative   Urobilinogen, Ur 0.2 0.2 - 1.0 mg/dL   Nitrite, UA Negative Negative   Microscopic Examination See below:    *Note: Due to a large number of results and/or encounters for the requested time period, some results have not been displayed. A complete set of results can be found in Results Review.   Assessment & Plan:   1. Recurrent UTI No change in dysuria despite a course of culture appropriate Macrobid.  UA is stably infected appearing despite antibiotics.  Differential includes persistent UTI versus recurrent UTI versus chronic cystitis versus other urinary pathology with concurrent bacterial colonization.  Will repeat urine culture, order atypical culture today, and treat with empiric fosfomycin.  Given her complex presentation, may consider  cystoscopy at her upcoming follow-up with Dr. Diamantina Providence for further evaluation. - Urinalysis, Complete - CULTURE, URINE COMPREHENSIVE - fosfomycin (MONUROL) 3 g PACK; Take 3 g by mouth once for 1 dose.  Dispense: 3 g; Refill: 0 - Mycoplasma / ureaplasma culture   Return in about 4 weeks (around 06/17/2020) for symptom recheck and possible cysto with Dr. Diamantina Providence as scheduled.  Debroah Loop, PA-C  [facility] [address], [place], [place] [address] [phone]

## 2020-05-21 ENCOUNTER — Encounter: Payer: Self-pay | Admitting: Cardiovascular Disease

## 2020-05-21 ENCOUNTER — Ambulatory Visit: Payer: Medicare Other | Admitting: Cardiovascular Disease

## 2020-05-21 VITALS — BP 132/94 | HR 92 | Ht 63.0 in | Wt 147.0 lb

## 2020-05-21 DIAGNOSIS — I739 Peripheral vascular disease, unspecified: Secondary | ICD-10-CM

## 2020-05-21 DIAGNOSIS — I5022 Chronic systolic (congestive) heart failure: Secondary | ICD-10-CM

## 2020-05-21 DIAGNOSIS — I951 Orthostatic hypotension: Secondary | ICD-10-CM

## 2020-05-21 DIAGNOSIS — I25118 Atherosclerotic heart disease of native coronary artery with other forms of angina pectoris: Secondary | ICD-10-CM

## 2020-05-21 DIAGNOSIS — I255 Ischemic cardiomyopathy: Secondary | ICD-10-CM

## 2020-05-21 DIAGNOSIS — I6523 Occlusion and stenosis of bilateral carotid arteries: Secondary | ICD-10-CM

## 2020-05-21 DIAGNOSIS — E785 Hyperlipidemia, unspecified: Secondary | ICD-10-CM

## 2020-05-21 DIAGNOSIS — I10 Essential (primary) hypertension: Secondary | ICD-10-CM

## 2020-05-21 NOTE — Patient Instructions (Signed)
Medication Instructions:  No changes  If you need a refill on your cardiac medications before your next appointment, please call your pharmacy.    Lab work: No new labs needed   If you have labs (blood work) drawn today and your tests are completely normal, you will receive your results only by: . MyChart Message (if you have MyChart) OR . A paper copy in the mail If you have any lab test that is abnormal or we need to change your treatment, we will call you to review the results.   Testing/Procedures: No new testing needed   Follow-Up: At [facility], you and your health needs are our priority.  As part of our continuing mission to provide you with exceptional heart care, we have created designated Provider Care Teams.  These Care Teams include your primary Cardiologist (physician) and Advanced Practice Providers (APPs -  Physician Assistants and Nurse Practitioners) who all work together to provide you with the care you need, when you need it.  . You will need a follow up appointment in 6 months  . Providers on your designated Care Team:   . Christopher Berge, NP . Ryan Dunn, PA-C . Jacquelyn Visser, PA-C  Any Other Special Instructions Will Be Listed Below (If Applicable).  COVID-19 Vaccine Information can be found at: [email or web address] For questions related to vaccine distribution or appointments, please email [email or web address] or call [phone].     

## 2020-05-22 ENCOUNTER — Other Ambulatory Visit: Payer: Self-pay | Admitting: Family Medicine

## 2020-05-22 ENCOUNTER — Telehealth: Payer: Self-pay

## 2020-05-22 NOTE — Telephone Encounter (Signed)
Requested medication (s) are due for refill today:   Provider to review  Requested medication (s) are on the active medication list:   Yes  Future visit scheduled:   No   Last ordered: 02/07/2020 #30, 1 refill   Non delegated refill    Requested Prescriptions  Pending Prescriptions Disp Refills   promethazine (PHENERGAN) 25 MG tablet [Pharmacy Med Name: Promethazine HCl 25 MG Oral Tablet] 30 tablet 0    Sig: TAKE 1 TABLET BY MOUTH EVERY 6 HOURS AS NEEDED FOR NAUSEA FOR VOMITING      Not Delegated - Gastroenterology: Antiemetics Failed - 05/22/2020 11:08 AM      Failed - This refill cannot be delegated      Passed - Valid encounter within last 6 months    Recent Outpatient Visits           1 month ago [place] Flinch[place], Kelby Aline, FNP   2 months ago Iron deficiency anemia due to chronic blood loss   [facility] [name], MD   3 months ago Anemia, unspecified type   [facility] [name], MD   4 months ago Dyspnea on exertion   [facility] [name], MD   5 months ago Acute pulmonary embolism without acute cor pulmonale, unspecified pulmonary embolism type [facility])   [facility] [name], MD       Future Appointments             In 5 months Deboraha Sprang, MD [facility]

## 2020-05-22 NOTE — Telephone Encounter (Signed)
Ben from [facility] called to update our office that the patient has completed her therapy and is doing well. She is able to understand her medication orders and care for discharge and will be discharged today.

## 2020-05-23 ENCOUNTER — Other Ambulatory Visit: Payer: Self-pay

## 2020-05-23 DIAGNOSIS — N390 Urinary tract infection, site not specified: Secondary | ICD-10-CM

## 2020-05-23 MED ORDER — NITROFURANTOIN MONOHYD MACRO 100 MG PO CAPS: 100.0000 mg | ORAL_CAPSULE | Freq: Two times a day (BID) | ORAL | 0 refills | Status: AC

## 2020-05-23 NOTE — Telephone Encounter (Signed)
Most recent cultures still pending.  Lets do Macrobid 100 mg twice daily x10 days, keep follow-up with me and change to a cystoscopy, continue to catheterize 1-2 times per day, and timed voiding.  Can use Pyridium for dysuria  Nickolas Madrid, MD 05/23/2020

## 2020-05-24 LAB — CULTURE, URINE COMPREHENSIVE

## 2020-05-27 ENCOUNTER — Other Ambulatory Visit: Payer: Self-pay

## 2020-05-27 DIAGNOSIS — B373 Candidiasis of vulva and vagina: Secondary | ICD-10-CM

## 2020-05-27 DIAGNOSIS — B3731 Acute candidiasis of vulva and vagina: Secondary | ICD-10-CM

## 2020-05-27 IMAGING — DX PORTABLE CHEST - 1 VIEW
1 series · 1 of 1 positions shown · non-contrast
Comparison: March 16, 2018

CLINICAL DATA: Dizziness and hypotension

EXAM:
PORTABLE CHEST 1 VIEW

[chest ap]
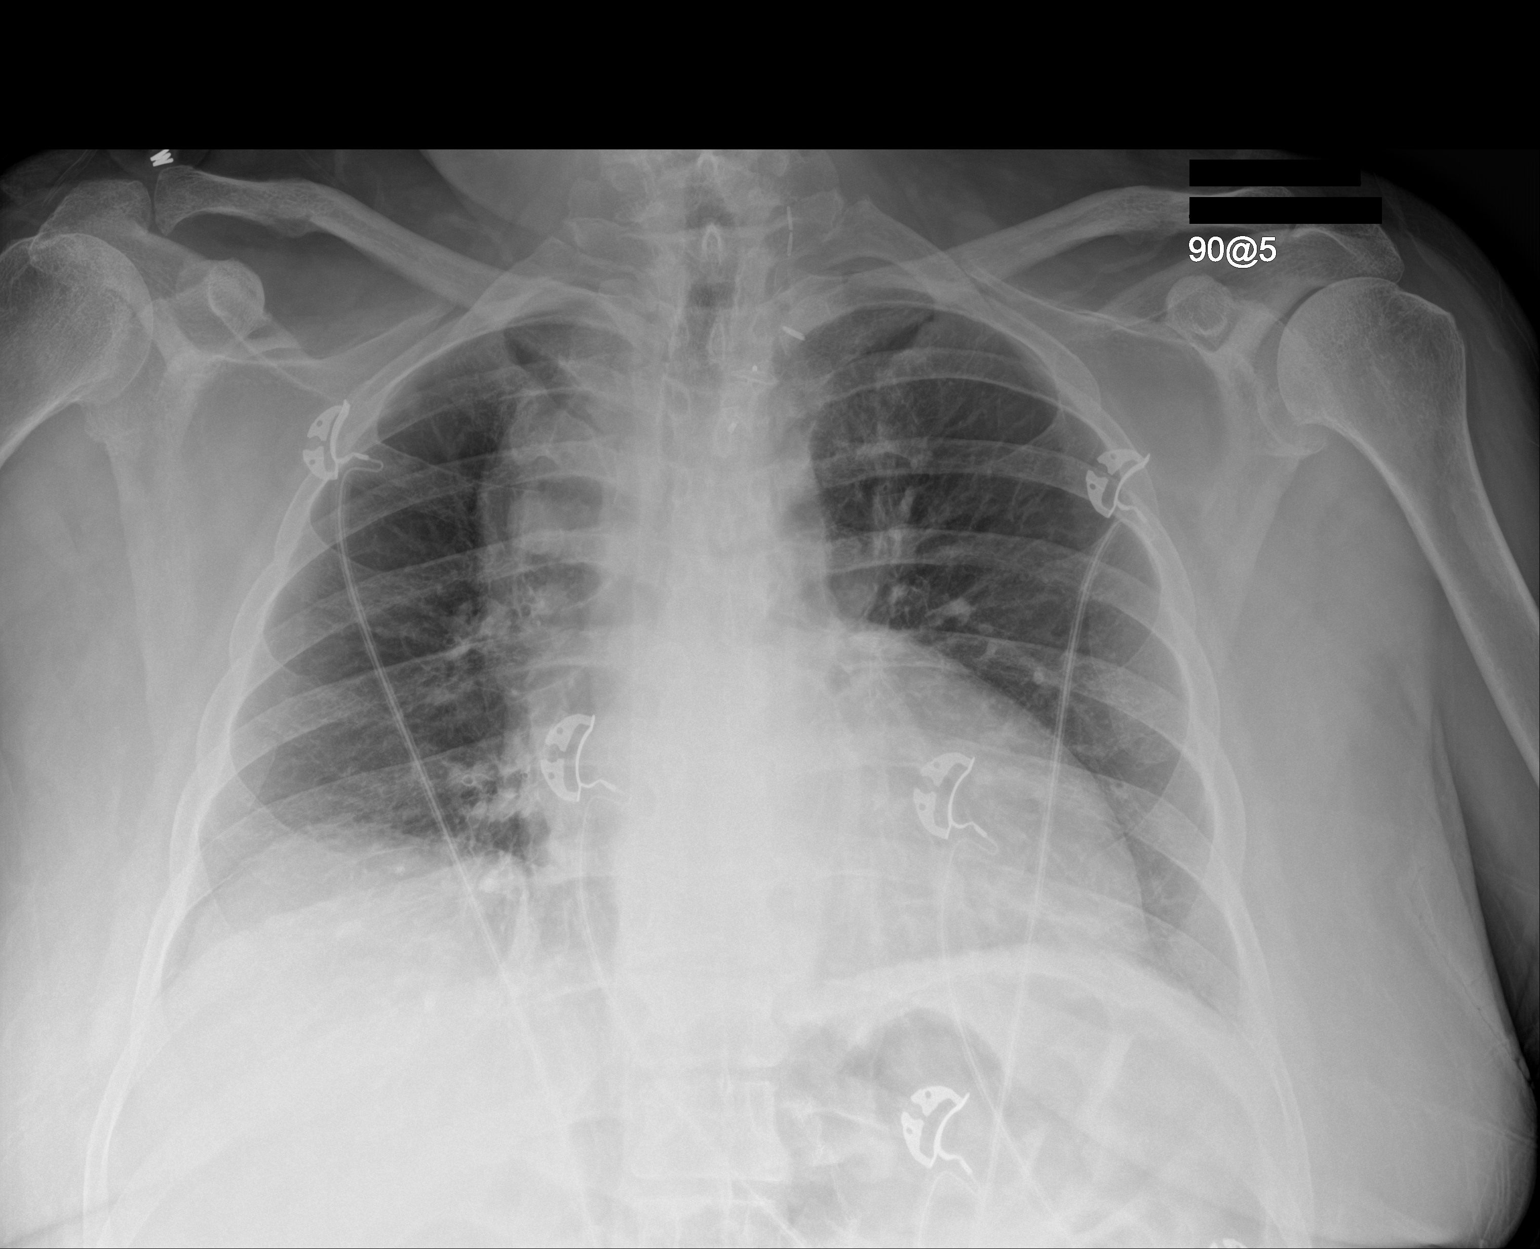

[1 of 1 positions shown; findings below may reference images not displayed]

FINDINGS: No edema or consolidation. Heart is upper normal in size with
pulmonary vascularity normal. No adenopathy. Surgical clips in the
left paratracheal region are stable. No pneumothorax. No bone
lesions.
IMPRESSION: No edema or consolidation.  Stable cardiac silhouette.

## 2020-05-27 IMAGING — CT CT HEAD WITHOUT CONTRAST
3 series · 15 of 46 positions shown, 18 images · non-contrast
Comparison: Head CT scan 04/05/2018 and 12/23/2017. Brain MRI
02/17/2018.

CLINICAL DATA: Headache and dizziness today.

EXAM:
CT HEAD WITHOUT CONTRAST
TECHNIQUE: Contiguous axial images were obtained from the base of the skull
through the vertex without intravenous contrast.

[Series 2: head wo · axial · 0.42mm/px · z∈[-151,-31]mm · 9 of 29 slices shown, 12 images]
[im 3/29  brain]
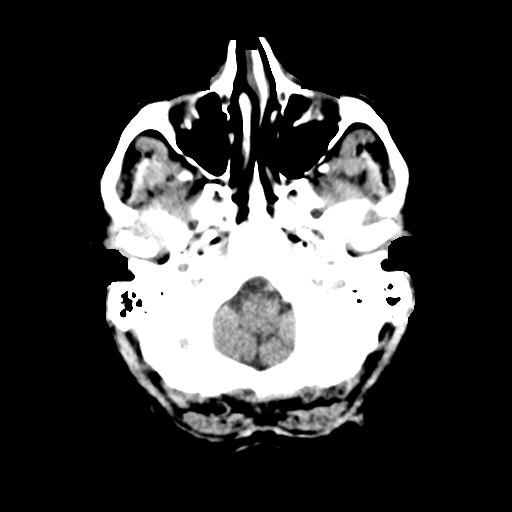
[im 3/29  bone]
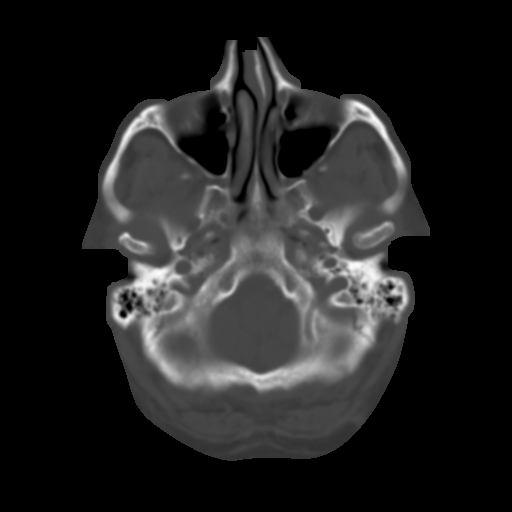
[im 6/29  brain]
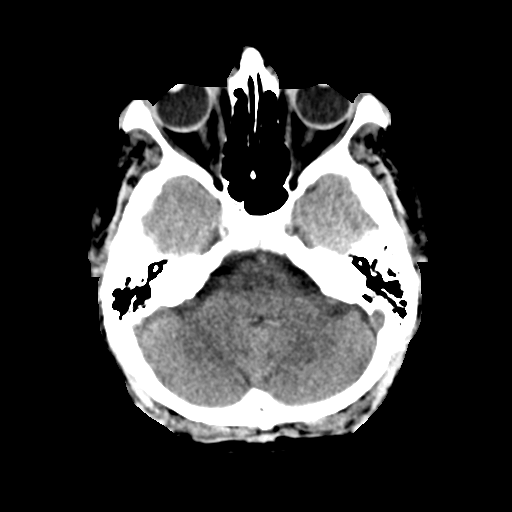
[im 9/29  brain]
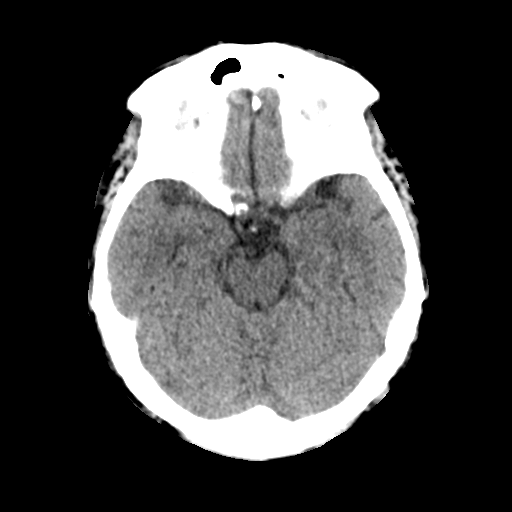
[im 12/29  brain]
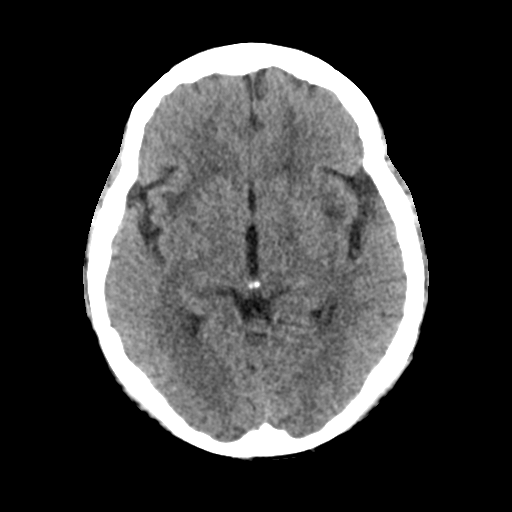
[im 15/29  brain]
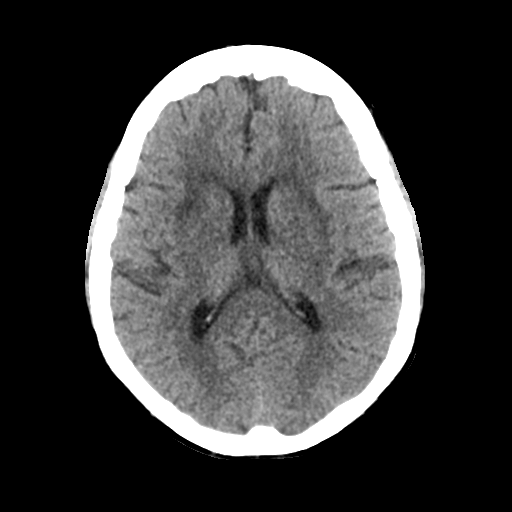
[im 15/29  bone]
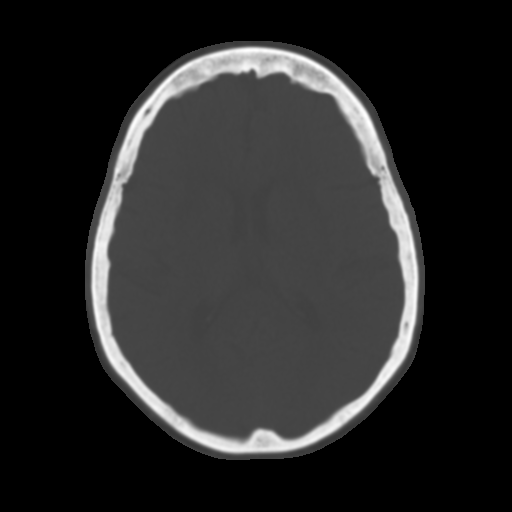
[im 18/29  brain]
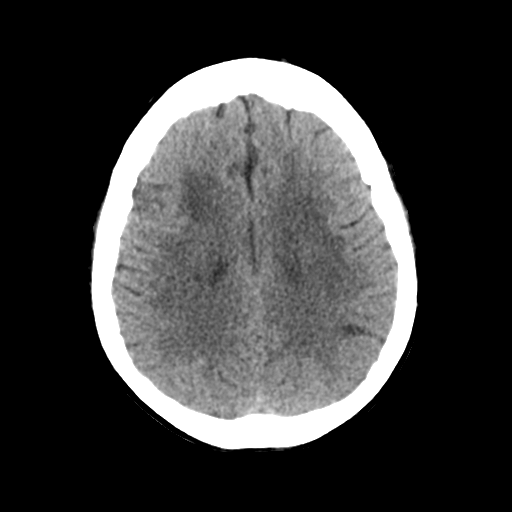
[im 21/29  brain]
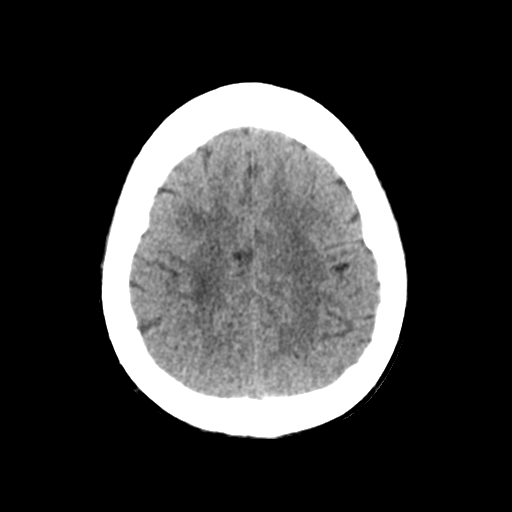
[im 24/29  brain]
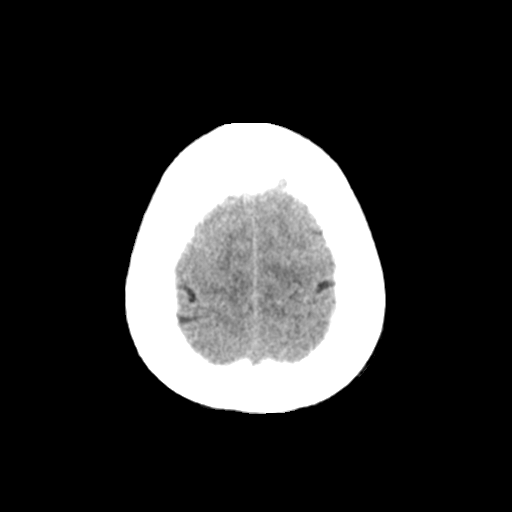
[im 27/29  brain]
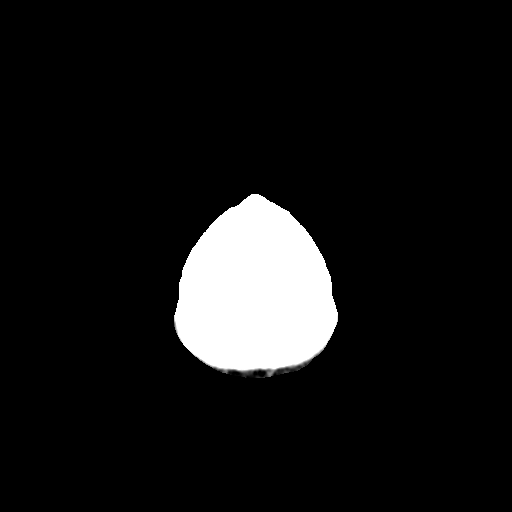
[im 27/29  bone]
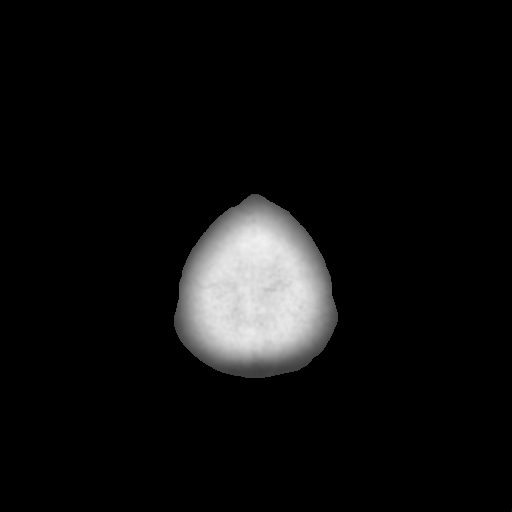

[Series 4: coronal soft tissue · coronal · 0.27mm/px · 3 of 60 slices shown]
[im 20/60  brain]
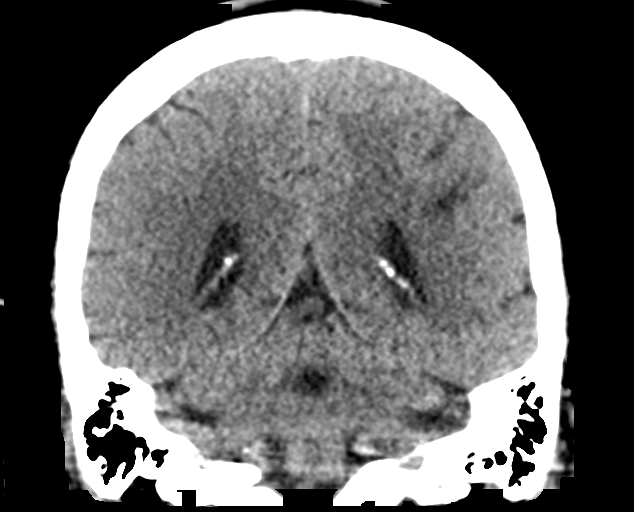
[im 27/60  brain]
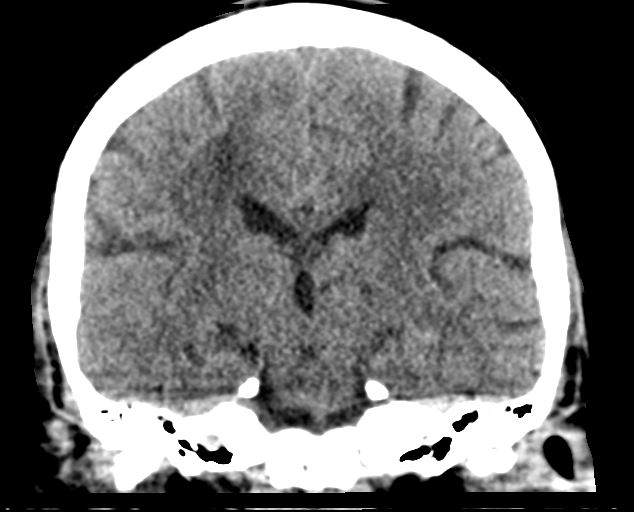
[im 33/60  brain]
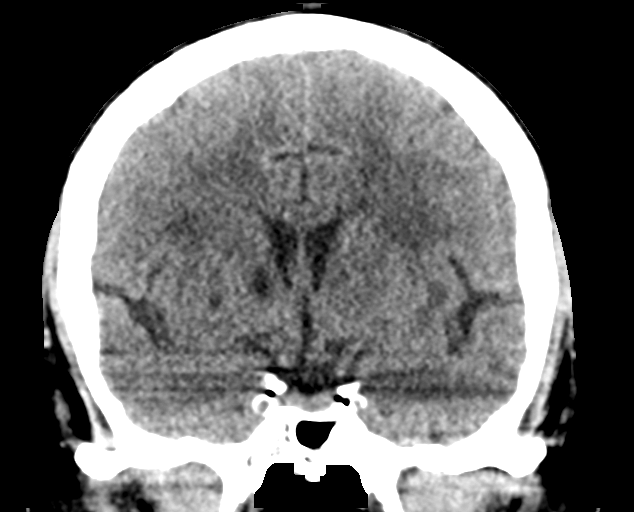

[Series 5: sagittal soft tissue · sagittal · 0.27mm/px · 3 of 52 slices shown]
[im 18/52  brain]
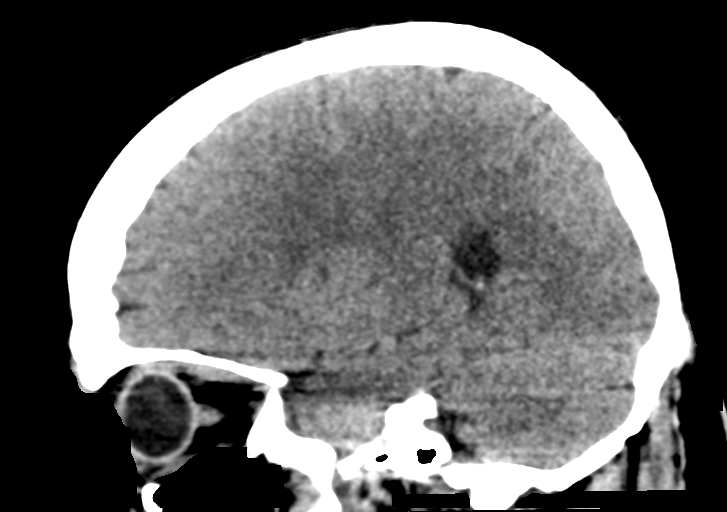
[im 26/52  brain]
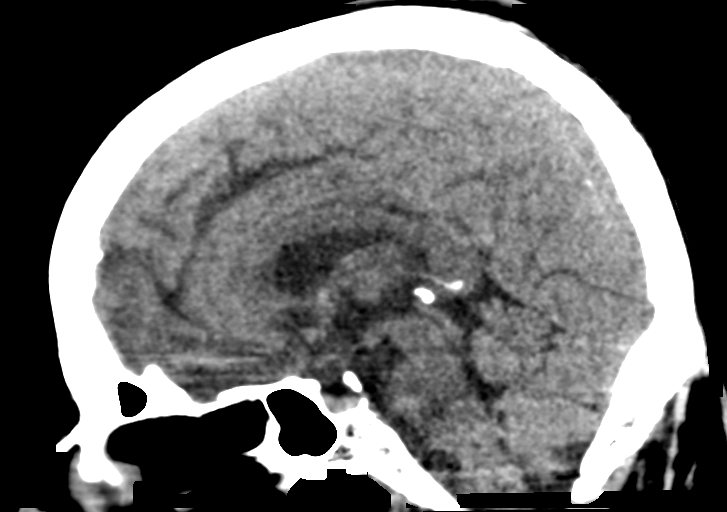
[im 35/52  brain]
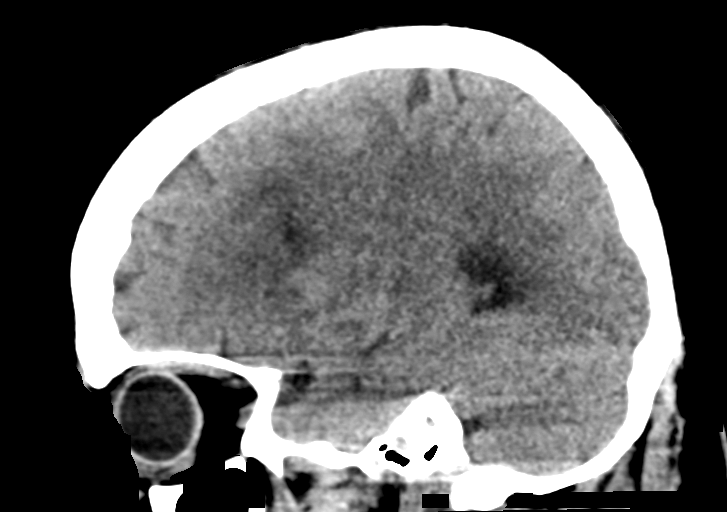

[15 of 46 positions shown; findings below may reference images not displayed]

FINDINGS: Brain: No evidence of acute infarction, hemorrhage, hydrocephalus,
extra-axial collection or mass lesion/mass effect. Age advanced
chronic microvascular ischemic disease is unchanged in appearance.

Vascular: No hyperdense vessel or unexpected calcification.

Skull: Intact.  No focal lesion.

Sinuses/Orbits: Negative.

Other: None.
IMPRESSION: No acute abnormality.

Age advanced chronic microvascular ischemic disease is unchanged in
appearance.

## 2020-05-27 MED ORDER — FLUCONAZOLE 150 MG PO TABS: 150.0000 mg | ORAL_TABLET | Freq: Once | ORAL | 1 refills | Status: AC

## 2020-05-27 NOTE — Telephone Encounter (Signed)
Okay to call in [place] for yeast infection, and I would recommend continuing to 3-4 times a day and we can change order as needed  Nickolas Madrid, MD 05/27/2020

## 2020-05-28 LAB — MYCOPLASMA / UREAPLASMA CULTURE
Mycoplasma hominis Culture: NEGATIVE
Ureaplasma urealyticum: NEGATIVE

## 2020-05-28 IMAGING — MR MRI HEAD WITHOUT CONTRAST
10 of 11 series · 34 of 48 positions shown · non-contrast
Comparison: Head CT 06/14/2018

CLINICAL DATA: Slurred speech with generalized weakness.

EXAM:
MRI HEAD WITHOUT CONTRAST
MRA HEAD WITHOUT CONTRAST
TECHNIQUE: Multiplanar, multiecho pulse sequences of the brain and surrounding
structures were obtained without intravenous contrast. Angiographic
images of the head were obtained using MRA technique without
contrast.

[Series 5: ax dwi_tracew · axial · 3.0mm · 0.68mm/px · z∈[-85,+77]mm · 5 of 55 slices shown]
[im 1/55]
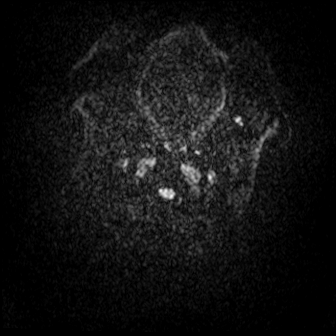
[im 14/55]
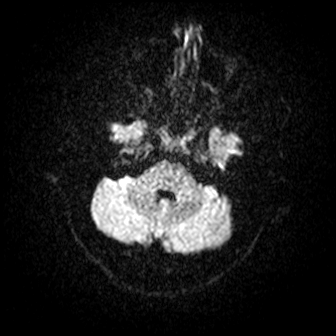
[im 28/55]
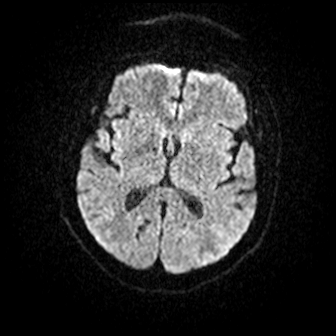
[im 41/55]
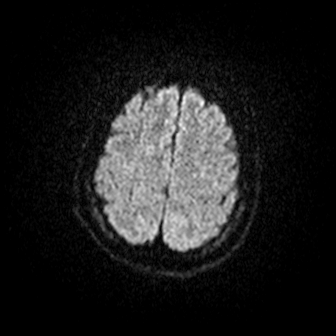
[im 55/55]
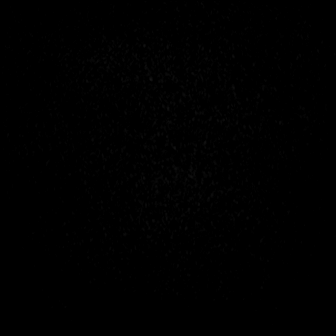

[Series 6: ax dwi_adc · axial · 3.0mm · 0.68mm/px · z∈[-85,+68]mm · 4 of 52 slices shown]
[im 1/52]
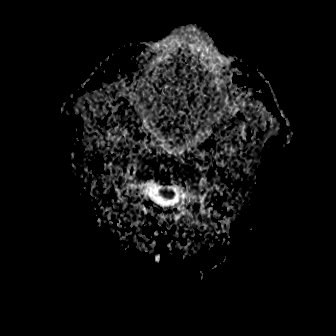
[im 18/52]
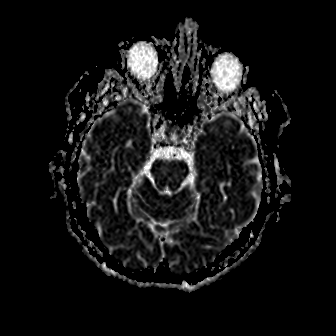
[im 35/52]
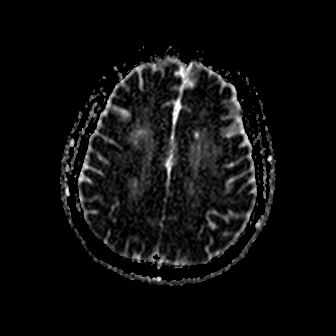
[im 52/52]
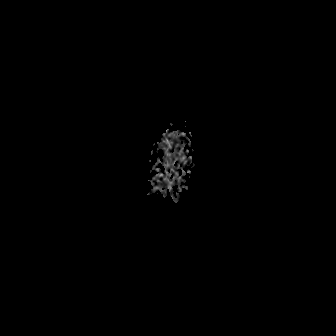

[Series 7: cor dwi_tracew · coronal · 5.0mm · 0.68mm/px · 3 of 41 slices shown]
[im 1/41]
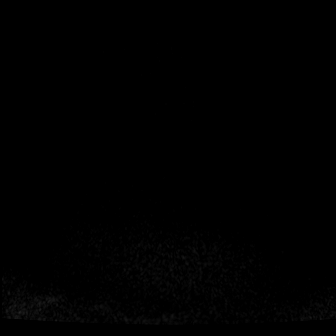
[im 21/41]
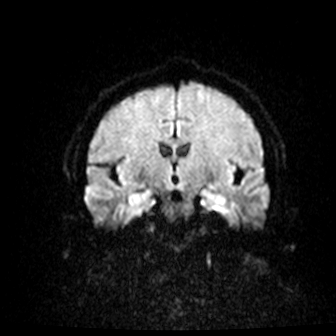
[im 41/41]
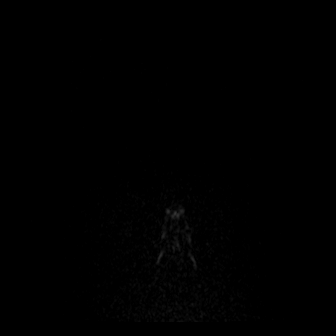

[Series 8: cor dwi_adc · coronal · 5.0mm · 0.68mm/px · 3 of 40 slices shown]
[im 1/40]
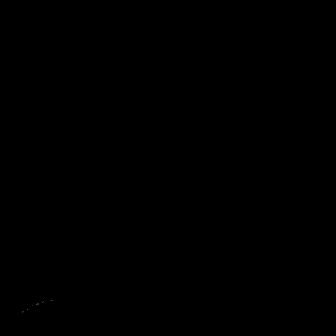
[im 20/40]
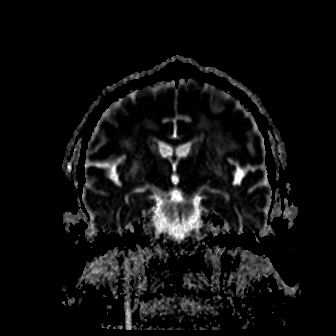
[im 40/40]
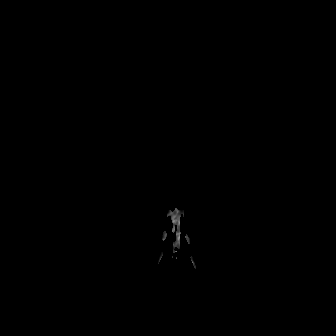

[Series 9: T1 · sagittal · 5.0mm · 0.94mm/px · 2 of 25 slices shown (1 of 2)]
[im 1/25]
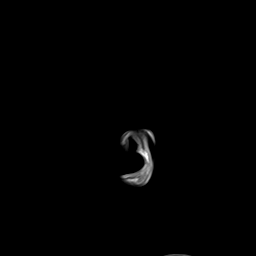
[im 25/25]
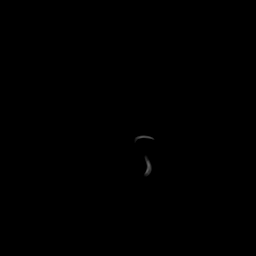

[Series 10: TOF · axial · 0.5mm · 0.41mm/px · z∈[-77,-48]mm · 3 of 172 slices shown]
[im 1/172]
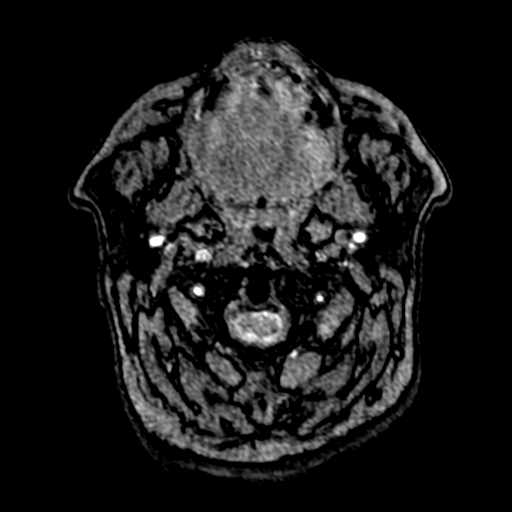
[im 29/172]
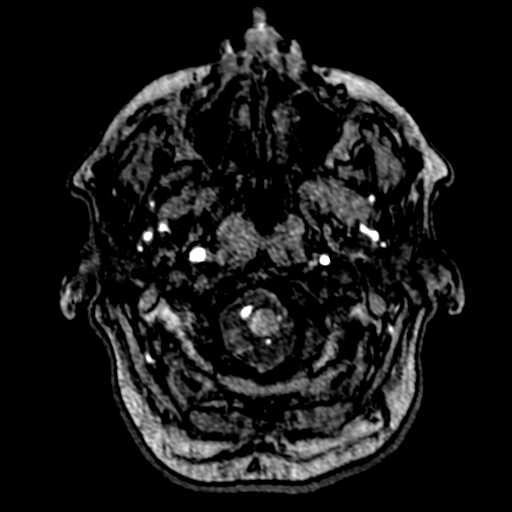
[im 58/172]
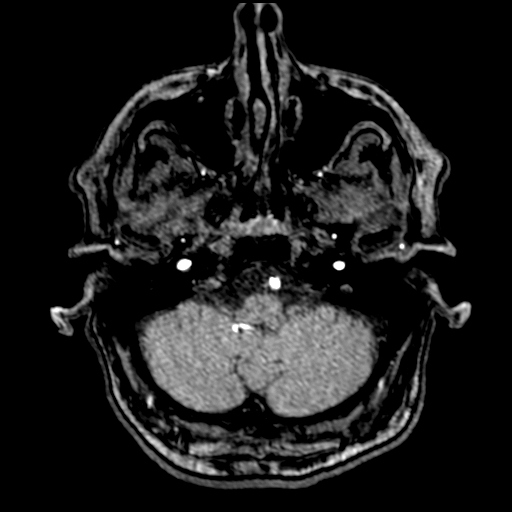

[Series 15: FLAIR · axial · 3.0mm · 0.53mm/px · z∈[-76,+86]mm · 4 of 55 slices shown]
[im 1/55]
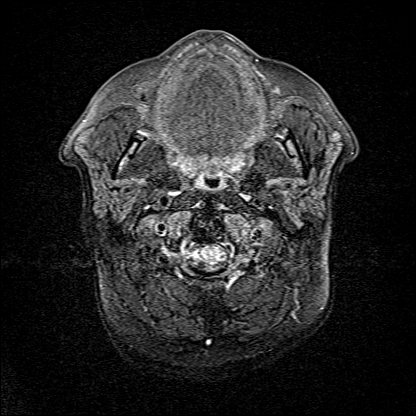
[im 19/55]
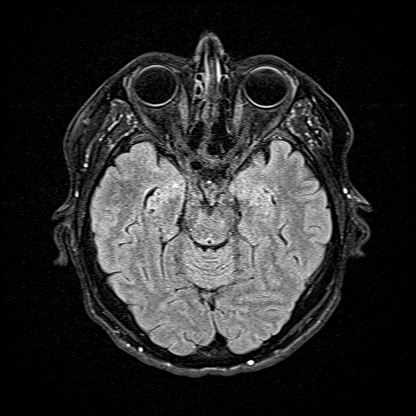
[im 37/55]
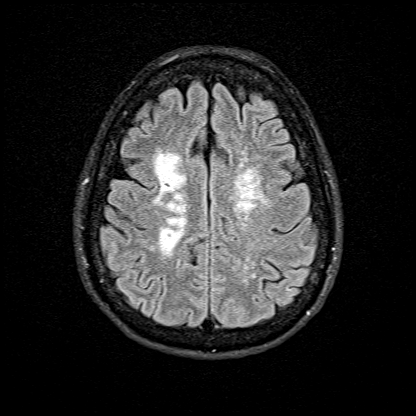
[im 55/55]
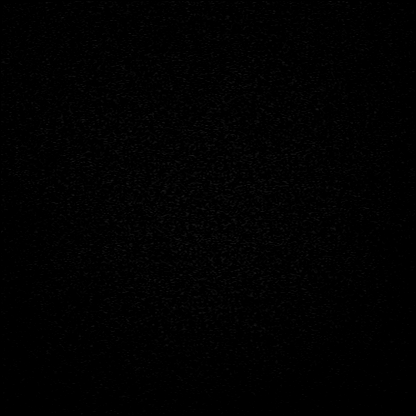

[Series 16: T2 · axial · 3.0mm · 0.45mm/px · z∈[-75,+86]mm · 4 of 55 slices shown (1 of 2)]
[im 1/55]
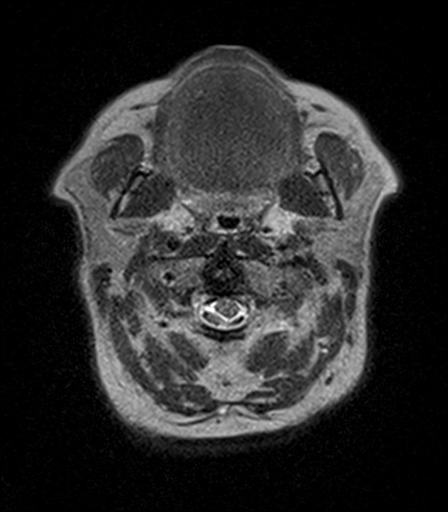
[im 19/55]
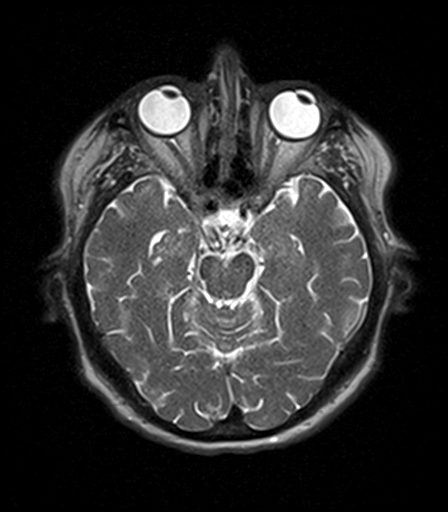
[im 37/55]
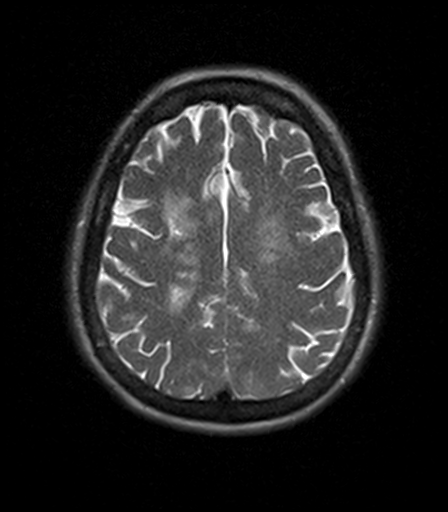
[im 55/55]
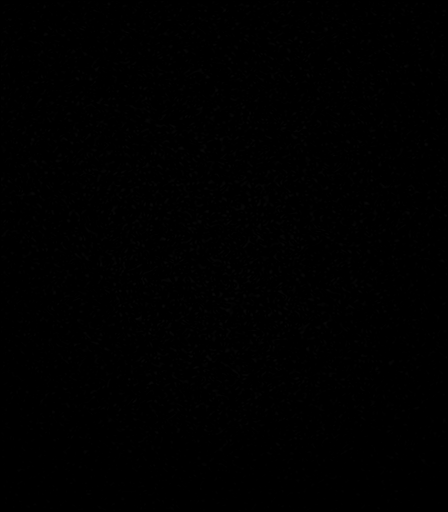

[Series 18: T1 · axial · 3.0mm · 0.90mm/px · z∈[-75,+86]mm · 4 of 55 slices shown (2 of 2)]
[im 1/55]
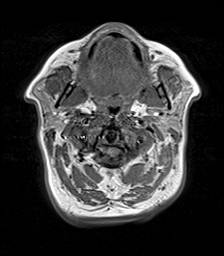
[im 19/55]
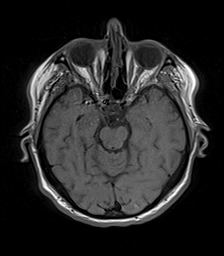
[im 37/55]
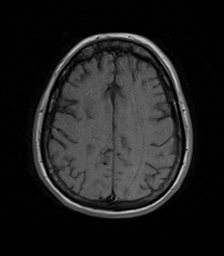
[im 55/55]
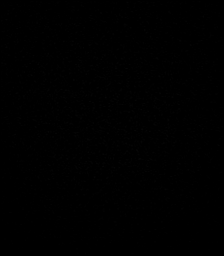

[Series 19: T2 · coronal · 5.0mm · 0.45mm/px · 2 of 31 slices shown (2 of 2)]
[im 1/31]
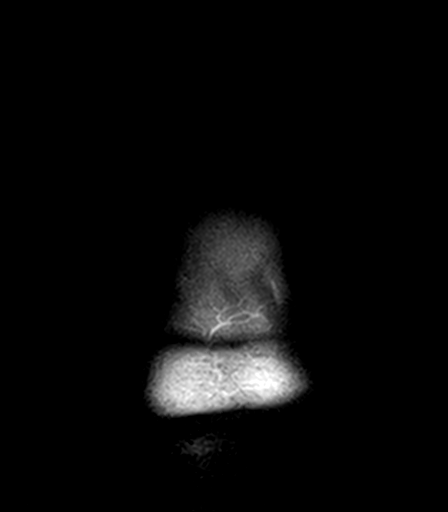
[im 31/31]
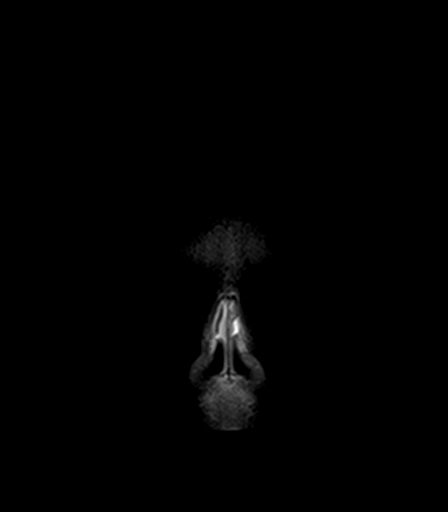

[34 of 48 positions shown; findings below may reference images not displayed]

FINDINGS: MRI HEAD FINDINGS

BRAIN: There is no acute infarct, acute hemorrhage or mass effect.
The midline structures are normal. Old bilateral small vessel
infarcts of the corona radiata. Old subcortical infarct the left
parietal lobe. Diffuse confluent hyperintense T2-weighted signal
within the periventricular, deep and juxtacortical white matter,
most commonly due to chronic ischemic microangiopathy. Hyperintense
T2-weighted signal at the brainstem is unchanged. The CSF spaces are
normal for age, with no hydrocephalus. Susceptibility-sensitive
sequences show no chronic microhemorrhage or superficial siderosis.

SKULL AND UPPER CERVICAL SPINE: The visualized skull base,
calvarium, upper cervical spine and extracranial soft tissues are
normal.

SINUSES/ORBITS: No fluid levels or advanced mucosal thickening. No
mastoid or middle ear effusion. The orbits are normal.

MRA HEAD FINDINGS

POSTERIOR CIRCULATION:

--Basilar artery: Normal.

--Posterior cerebral arteries: Normal right PCA, partially supplied
by the right P-comm. The left PCA severely stenotic at the distal P1
segment. Distal PCA is normal.

--Superior cerebellar arteries: Normal.

--Inferior cerebellar arteries: Normal anterior and posterior
inferior cerebellar arteries.

ANTERIOR CIRCULATION:

--Intracranial internal carotid arteries: Normal.

--Anterior cerebral arteries: Normal. Absent left A1 segment, normal
variant patent anterior communicating artery.

--Middle cerebral arteries: Normal.

--Posterior communicating arteries: Present on the right, absent on
the left.
IMPRESSION: 1. No acute intracranial abnormality.
2. Severe stenosis of the left PCA distal P1 segment, unchanged.
3. Severe chronic microvascular ischemic disease of the white matter
with multiple old infarcts.

## 2020-05-28 IMAGING — US BILATERAL CAROTID DUPLEX ULTRASOUND
1 series · 13 of 24 positions shown · non-contrast
Comparison: 02/18/2018

CLINICAL DATA: Slurred speech

EXAM:
BILATERAL CAROTID DUPLEX ULTRASOUND
TECHNIQUE: Gray scale imaging, color Doppler and duplex ultrasound were
performed of bilateral carotid and vertebral arteries in the neck.

[Series 1: bilateral carotid duplex ultrasound · 13 of 67 slices shown]
[im 1/67]
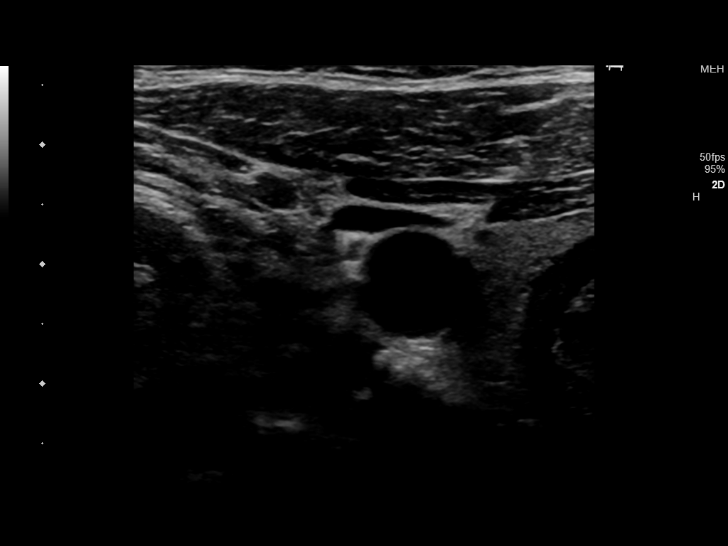
[im 6/67]
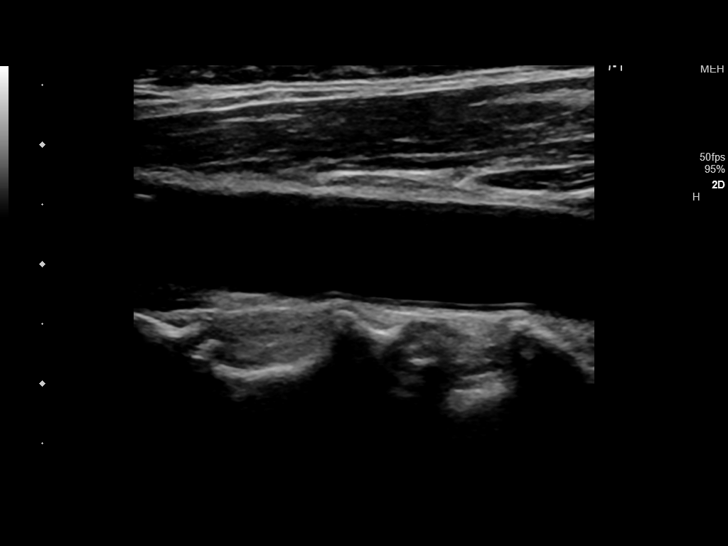
[im 12/67]
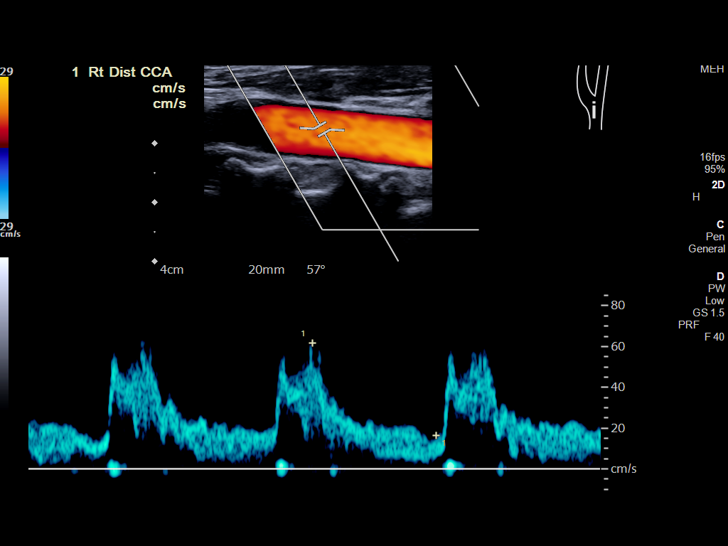
[im 18/67]
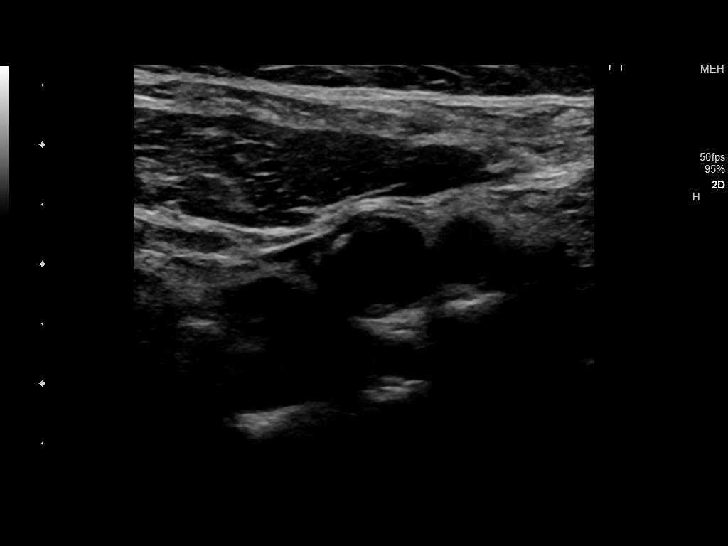
[im 23/67]
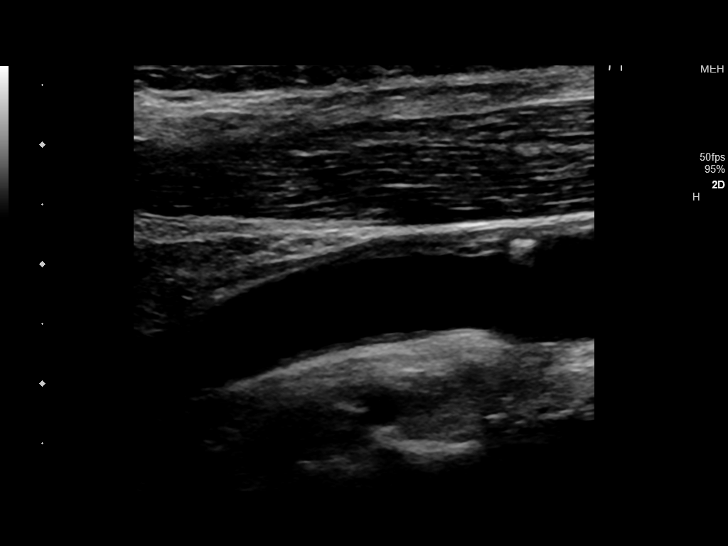
[im 29/67]
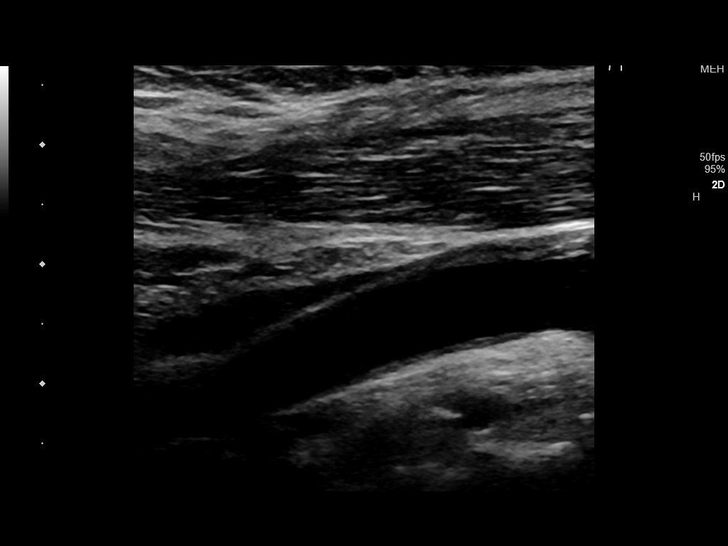
[im 35/67]
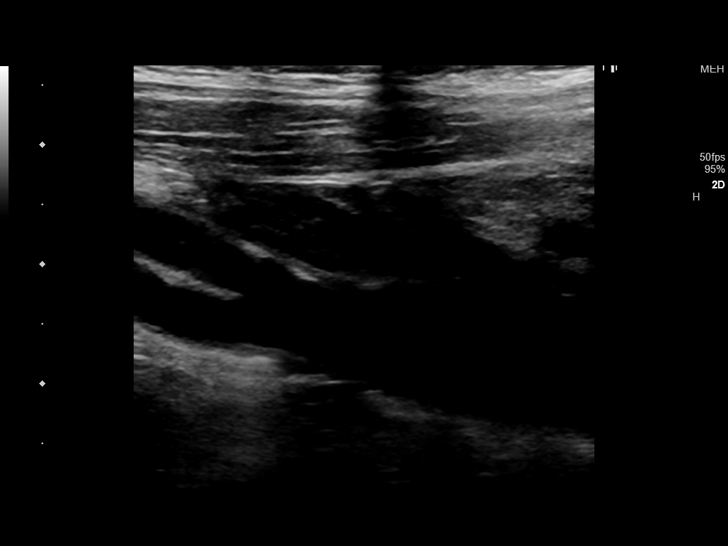
[im 38/67]
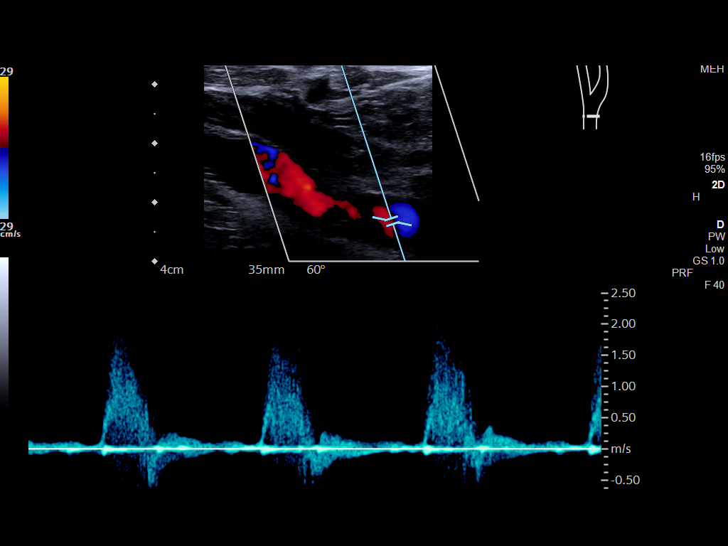
[im 44/67]
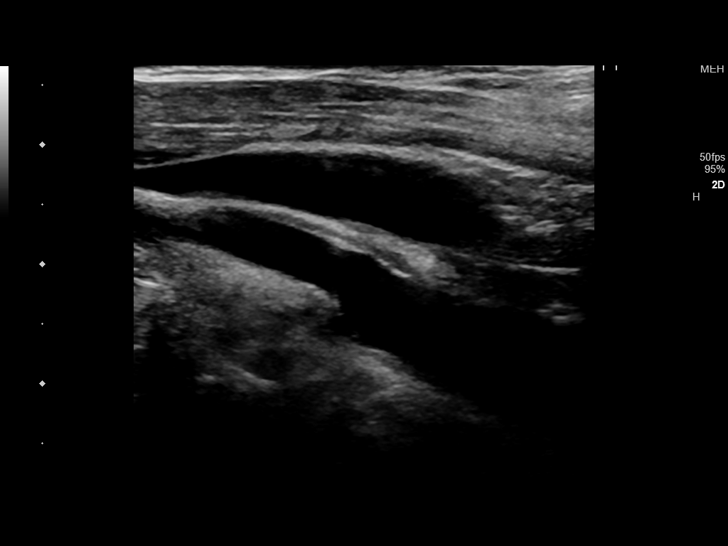
[im 49/67]
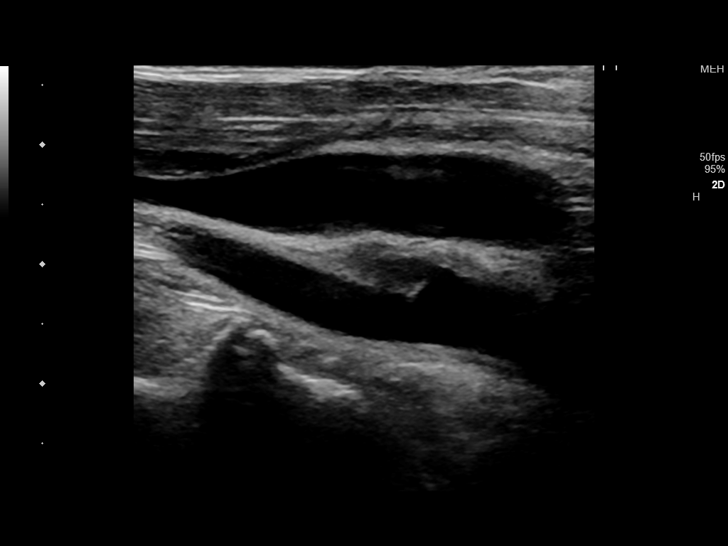
[im 55/67]
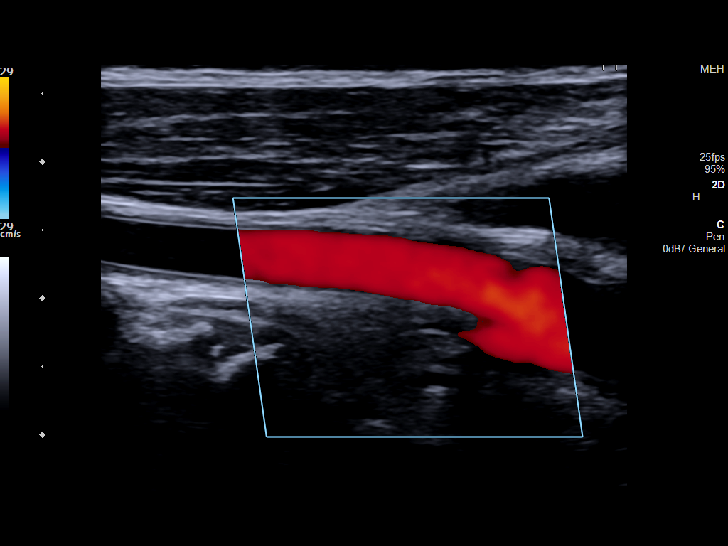
[im 61/67]
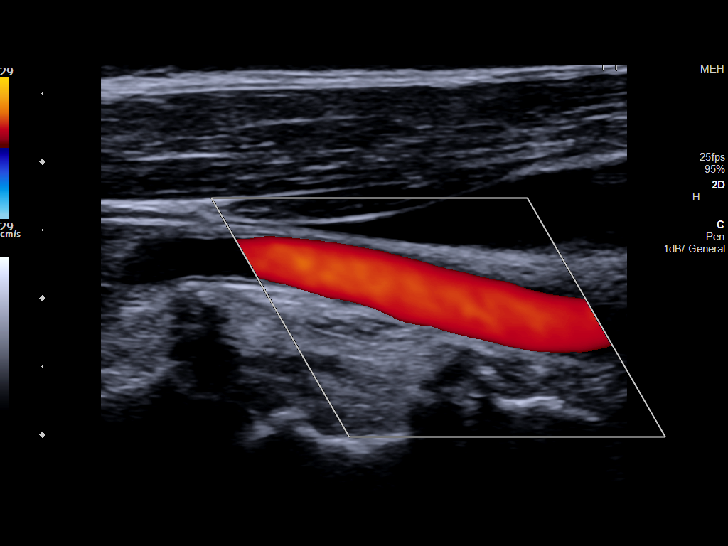
[im 67/67]
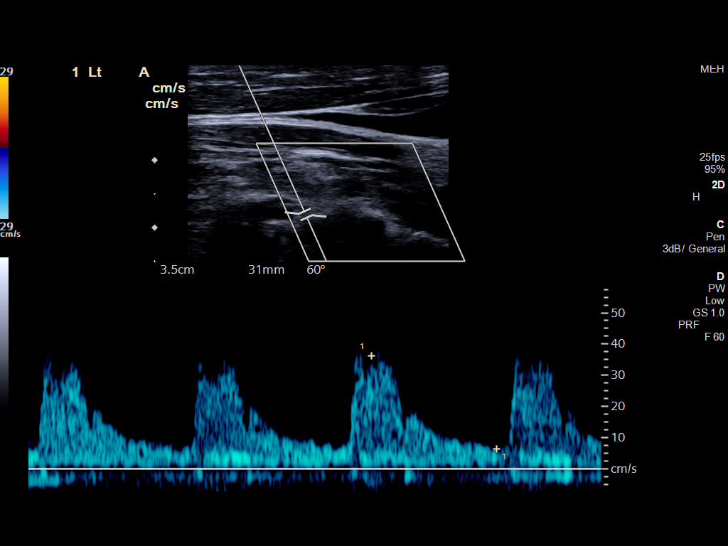

[13 of 24 positions shown; findings below may reference images not displayed]

FINDINGS: Criteria: Quantification of carotid stenosis is based on velocity
parameters that correlate the residual internal carotid diameter
with NASCET-based stenosis levels, using the diameter of the distal
internal carotid lumen as the denominator for stenosis measurement.

The following velocity measurements were obtained:

RIGHT

ICA: 72/31 cm/sec

CCA: 73/19 cm/sec

SYSTOLIC ICA/CCA RATIO:

ECA: 65 cm/sec

LEFT

ICA: 97/32 cm/sec

CCA: 133/13 cm/sec

SYSTOLIC ICA/CCA RATIO:

ECA: 83 cm/sec

RIGHT CAROTID ARTERY: Minor echogenic shadowing plaque formation. No
hemodynamically significant right ICA stenosis, velocity elevation,
or turbulent flow. Degree of narrowing less than 50%.

RIGHT VERTEBRAL ARTERY:  Antegrade

LEFT CAROTID ARTERY: Similar scattered minor echogenic plaque
formation. No hemodynamically significant left ICA stenosis,
velocity elevation, or turbulent flow.

LEFT VERTEBRAL ARTERY:  Antegrade
IMPRESSION: Minor carotid atherosclerosis. No hemodynamically significant ICA
stenosis. Degree of narrowing less than 50% bilaterally by
ultrasound criteria.

Patent antegrade vertebral bilaterally

## 2020-05-30 ENCOUNTER — Encounter: Payer: Self-pay | Admitting: Family Medicine

## 2020-05-30 DIAGNOSIS — G4700 Insomnia, unspecified: Secondary | ICD-10-CM

## 2020-05-31 MED ORDER — TRAZODONE HCL 100 MG PO TABS: 100.0000 mg | ORAL_TABLET | Freq: Every day | ORAL | 5 refills | Status: DC

## 2020-06-05 ENCOUNTER — Ambulatory Visit: Payer: Self-pay | Admitting: Family Medicine

## 2020-06-07 IMAGING — DX PORTABLE CHEST - 1 VIEW
1 series · 2 of 2 positions shown · non-contrast
Comparison: 06/14/2018

CLINICAL DATA: Weakness

EXAM:
PORTABLE CHEST 1 VIEW

[Series 1: chest ap · 0.14mm/px · 2 of 2 slices shown]
[im 1/2]
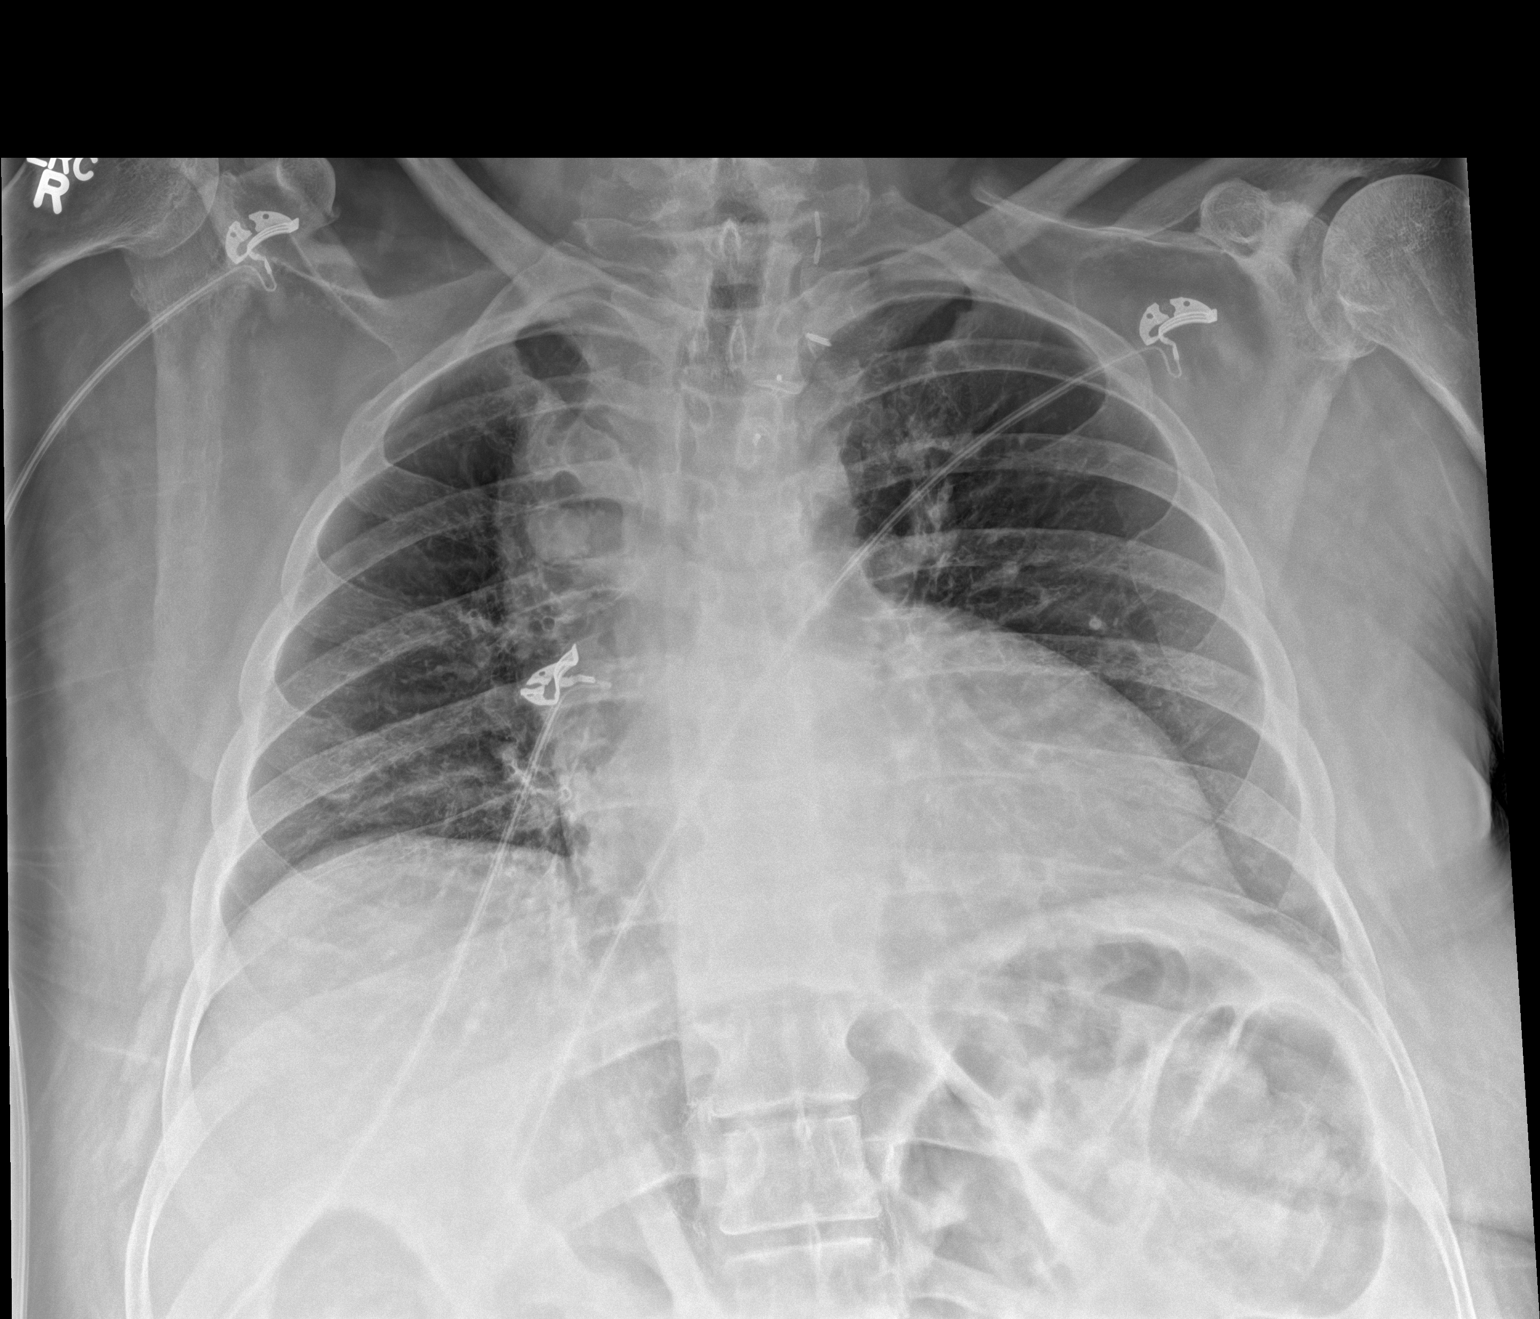
[im 2/2]
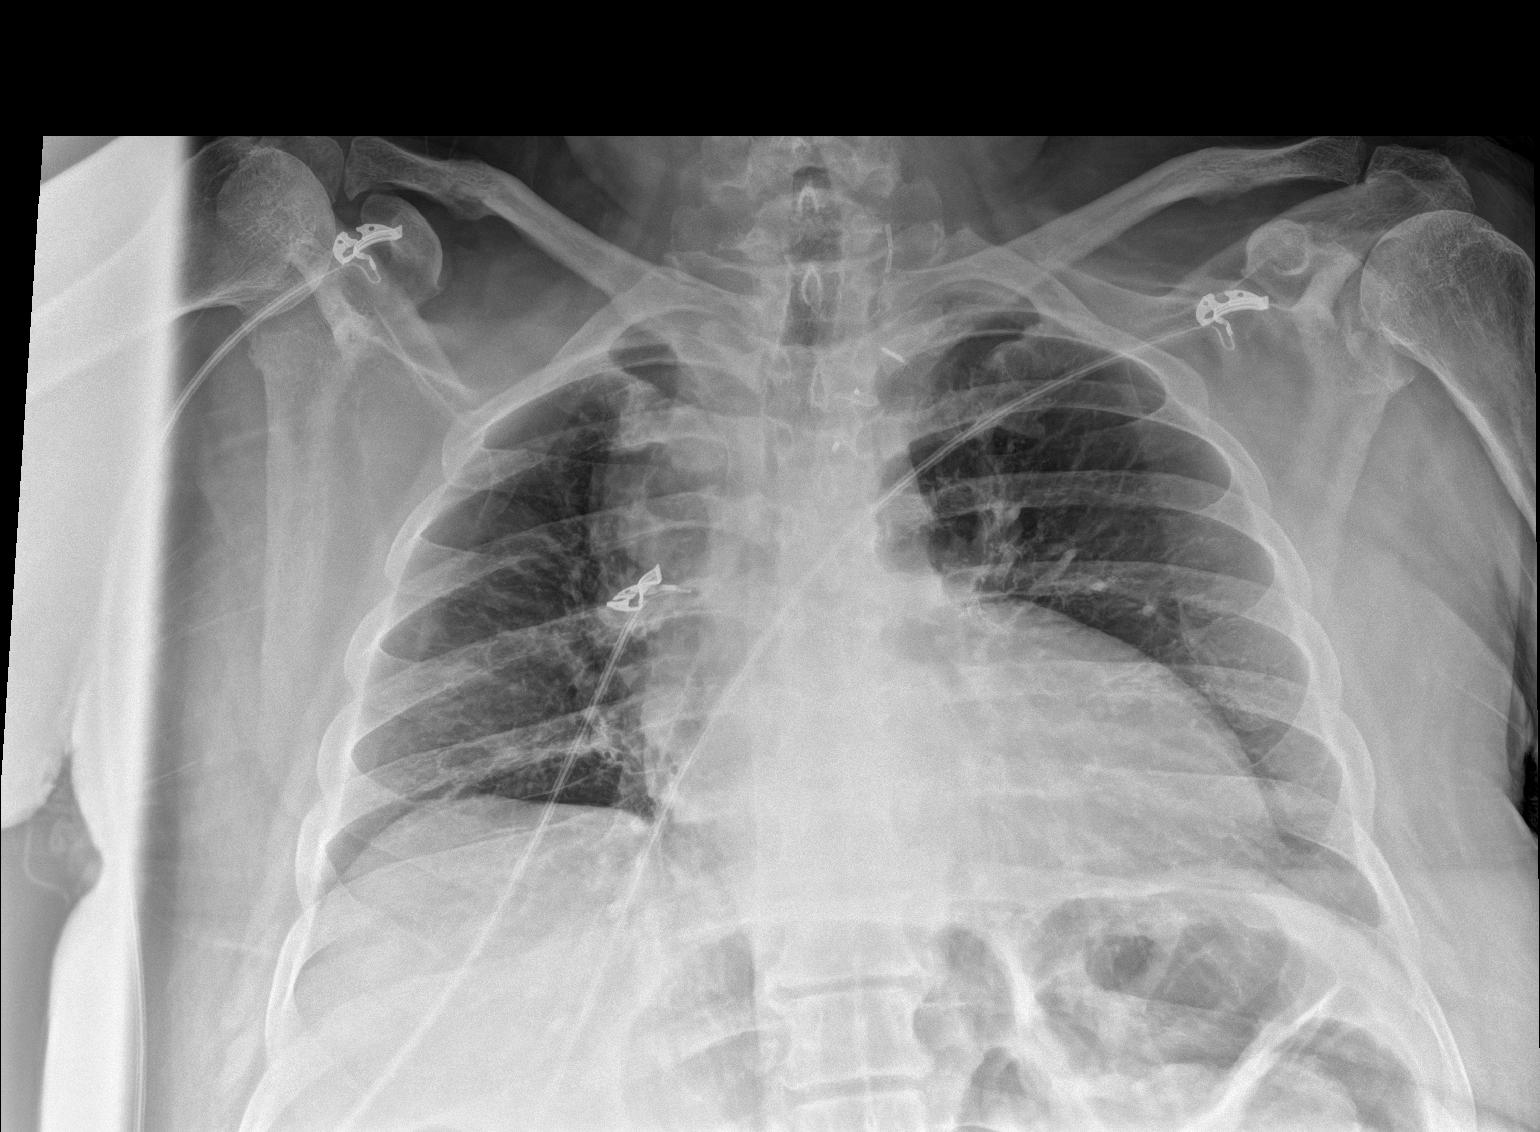

[2 of 2 positions shown; findings below may reference images not displayed]

FINDINGS: The cardiac silhouette is enlarged. There is some vascular
congestion. There is no pneumothorax. Surgical staples project over
the left upper mediastinum. A coronary artery stent is noted. No
acute osseous abnormality.
IMPRESSION: Cardiomegaly with low lung volumes. Mild vascular congestion is
suspected.

## 2020-06-09 ENCOUNTER — Emergency Department: Payer: Medicare Other

## 2020-06-09 ENCOUNTER — Emergency Department
Admission: EM | Admit: 2020-06-09 | Discharge: 2020-06-09 | Disposition: A | Discharge status: Home / Self Care | Payer: Medicare Other | Attending: Emergency Medicine | Admitting: Emergency Medicine

## 2020-06-09 ENCOUNTER — Other Ambulatory Visit: Payer: Self-pay

## 2020-06-09 DIAGNOSIS — I129 Hypertensive chronic kidney disease with stage 1 through stage 4 chronic kidney disease, or unspecified chronic kidney disease: Secondary | ICD-10-CM | POA: Diagnosis not present

## 2020-06-09 DIAGNOSIS — S060X0A Concussion without loss of consciousness, initial encounter: Secondary | ICD-10-CM | POA: Diagnosis not present

## 2020-06-09 DIAGNOSIS — D631 Anemia in chronic kidney disease: Secondary | ICD-10-CM | POA: Diagnosis not present

## 2020-06-09 DIAGNOSIS — Z79899 Other long term (current) drug therapy: Secondary | ICD-10-CM | POA: Diagnosis not present

## 2020-06-09 DIAGNOSIS — Z85828 Personal history of other malignant neoplasm of skin: Secondary | ICD-10-CM | POA: Insufficient documentation

## 2020-06-09 DIAGNOSIS — I2510 Atherosclerotic heart disease of native coronary artery without angina pectoris: Secondary | ICD-10-CM | POA: Insufficient documentation

## 2020-06-09 DIAGNOSIS — E1122 Type 2 diabetes mellitus with diabetic chronic kidney disease: Secondary | ICD-10-CM | POA: Diagnosis not present

## 2020-06-09 DIAGNOSIS — W01198A Fall on same level from slipping, tripping and stumbling with subsequent striking against other object, initial encounter: Secondary | ICD-10-CM | POA: Diagnosis not present

## 2020-06-09 DIAGNOSIS — E1129 Type 2 diabetes mellitus with other diabetic kidney complication: Secondary | ICD-10-CM | POA: Insufficient documentation

## 2020-06-09 DIAGNOSIS — Z8544 Personal history of malignant neoplasm of other female genital organs: Secondary | ICD-10-CM | POA: Insufficient documentation

## 2020-06-09 DIAGNOSIS — M25531 Pain in right wrist: Secondary | ICD-10-CM | POA: Diagnosis not present

## 2020-06-09 DIAGNOSIS — Z7902 Long term (current) use of antithrombotics/antiplatelets: Secondary | ICD-10-CM | POA: Insufficient documentation

## 2020-06-09 DIAGNOSIS — S0083XA Contusion of other part of head, initial encounter: Secondary | ICD-10-CM | POA: Diagnosis not present

## 2020-06-09 DIAGNOSIS — N1831 Chronic kidney disease, stage 3a: Secondary | ICD-10-CM | POA: Diagnosis not present

## 2020-06-09 DIAGNOSIS — Z87891 Personal history of nicotine dependence: Secondary | ICD-10-CM | POA: Insufficient documentation

## 2020-06-09 DIAGNOSIS — S0993XA Unspecified injury of face, initial encounter: Secondary | ICD-10-CM | POA: Diagnosis present

## 2020-06-09 LAB — CBC
HCT: 35.3 % — ABNORMAL LOW (ref 36.0–46.0)
Hemoglobin: 11 g/dL — ABNORMAL LOW (ref 12.0–15.0)
MCH: 25.1 pg — ABNORMAL LOW (ref 26.0–34.0)
MCHC: 31.2 g/dL (ref 30.0–36.0)
MCV: 80.6 fL (ref 80.0–100.0)
Platelets: 171 10*3/uL (ref 150–400)
RBC: 4.38 MIL/uL (ref 3.87–5.11)
RDW: 15.4 % (ref 11.5–15.5)
WBC: 5.9 10*3/uL (ref 4.0–10.5)
nRBC: 0 % (ref 0.0–0.2)

## 2020-06-09 LAB — BASIC METABOLIC PANEL
Anion gap: 8 (ref 5–15)
BUN: 17 mg/dL (ref 6–20)
CO2: 21 mmol/L — ABNORMAL LOW (ref 22–32)
Calcium: 8.9 mg/dL (ref 8.9–10.3)
Chloride: 110 mmol/L (ref 98–111)
Creatinine, Ser: 1.35 mg/dL — ABNORMAL HIGH (ref 0.44–1.00)
GFR, Estimated: 48 mL/min — ABNORMAL LOW (ref >60)
Glucose, Bld: 75 mg/dL (ref 70–99)
Potassium: 5.4 mmol/L — ABNORMAL HIGH (ref 3.5–5.1)
Sodium: 139 mmol/L (ref 135–145)

## 2020-06-09 MED ORDER — HYDROCODONE-ACETAMINOPHEN 5-325 MG PO TABS: 1.0000 | ORAL_TABLET | ORAL | 0 refills | Status: DC | PRN

## 2020-06-09 MED ORDER — ONDANSETRON 4 MG PO TBDP
4.0000 mg | ORAL_TABLET | Freq: Once | ORAL | Status: AC
Start: 2020-06-09 — End: 2020-06-09
  Administered 2020-06-09: 4 mg via ORAL
  Filled 2020-06-09: qty 1

## 2020-06-09 MED ORDER — HYDROCODONE-ACETAMINOPHEN 5-325 MG PO TABS
1.0000 | ORAL_TABLET | Freq: Once | ORAL | Status: AC
  Administered 2020-06-09: 1 via ORAL
  Filled 2020-06-09: qty 1

## 2020-06-09 MED ORDER — ONDANSETRON 4 MG PO TBDP: 4.0000 mg | ORAL_TABLET | Freq: Three times a day (TID) | ORAL | 0 refills | Status: DC | PRN

## 2020-06-09 NOTE — ED Provider Notes (Signed)
[facility] Emergency Department Provider Note   ____________________________________________   Event Date/Time   First MD Initiated Contact with Patient 06/09/20 1056     (approximate)  I have reviewed the triage vital signs and the nursing notes.   HISTORY  Chief Complaint Fall    HPI [name] is a 50 y.o. female with the below stated past medical history presents via EMS after a mechanical fall over a door threshold resulting in hitting her face on the side of a wall.  Patient now complaining of significant right-sided facial swelling and pain that is aching, nonradiating, and associated with a large facial hematoma.  Patient endorses blood thinner use however does not know the name.  Upon questioning patient also complains of right lateral wrist pain. Patient currently denies any tinnitus, difficulty speaking, facial droop, sore throat, chest pain, shortness of breath, abdominal pain, nausea/vomiting/diarrhea, dysuria, or weakness/numbness/paresthesias in any extremity         Past Medical History:  Diagnosis Date  . Acute colitis 01/27/2017  . Acute pyelonephritis   . Anemia    iron deficiency anemia  . Aortic arch aneurysm ([place])   . BRCA negative 2014  . CAD (coronary artery disease)    a. 08/2003 Cath: LAD 30-40-med Rx; b. 11/2014 PCI: LAD 29m(3.25x23 Xience Alpine DES); c. 06/2015 PCI: D1 (2.25x12 Resolute Integrity DES); d. 06/2017 PCI: Patent mLAD stent, D2 95 (PTCA); e. 09/2017 PCI: D2 99ost (CBA); d. 12/2017 Cath: LM nl, LAD 365m80d (small), D1 40ost, D2 95ost, LCX 40p, RCA 40ost/p->Med rx for D2 given restenosis.  . Colitis 06/03/2015  . Colon polyp   . CVA (cerebral vascular accident) (HCBayonne   Left side weakness.   . Degenerative tear of glenoid labrum of right shoulder 03/15/2017  . Diabetes mellitus without complication (HCGhent  . Family history of breast cancer    BRCA neg 2014  . Femur fracture, left (HCMeriden9/10/2018  .  Gastric ulcer 04/27/2011  . History of echocardiogram    a. 03/2017 Echo: EF 60-65%, no rwma; b. 02/2018 Echo: EF 60-65%, no rwma. Nl RV fxn. No cardiac source of emboli (admitted w/ stroke).  . Hypertension   . Malignant melanoma of skin of scalp (HCSanta Nella  . MI, acute, non ST segment elevation (HCGrand Rivers  . Neuromuscular disorder (HCJAARS  . S/P drug eluting coronary stent placement 06/04/2015  . Sepsis (HCGreenbriar2/14/2019  . [facility] ([facility]   a. 02/2018 MRI: 29m59mate acute/early subacute L medial frontal lobe inarct; b. 02/2018 MRA No large vessel occlusion or aneurysm. Mod to sev L P2 stenosis. thready L vertebral artery, diffusely dzs'd; c. 02/2018 Carotid U/S: <50% bilat ICA dzs.    Patient Active Problem List   Diagnosis Date Noted  . Sepsis secondary to UTI (HCCCoggon3/22/2022  . Hyperkalemia 04/23/2020  . Unspecified atrial fibrillation (HCCGreentown3/22/2022  . Family history of malignant neoplasm of gastrointestinal tract   . Iron deficiency anemia secondary to blood loss (chronic)   . Acute on chronic anemia 03/07/2020  . AKI (acute kidney injury) (HCCGrafton2/04/2020  . Upper GI bleed   . Near syncope 01/25/2020  . Diabetes mellitus (HCCDillon2/23/2021  . Dehydration 01/25/2020  . Acute upper GI bleed 01/25/2020  . Pain in limb 01/05/2020  . Pulmonary embolism (HCCWichita Falls0/06/2019  . Chest pain 11/05/2019  . Type II diabetes mellitus with renal manifestations (HCCHaswell0/04/2019  . Acute renal failure superimposed on  stage 3a chronic kidney disease ([place]) 09/02/2019  . Hypoglycemia   . Family history of breast cancer 05/23/2019  . Family history of colon cancer 05/23/2019  . Retinopathy 05/12/2019  . Abnormal LFTs 04/18/2019  . Esophageal dysphagia   . Gastrointestinal tract imaging abnormality   . Trochanteric bursitis, left hip 03/17/2019  . Malignant neoplasm of vulva, unspecified ([place]) 03/10/2019  . Peripheral vascular disease, unspecified ([place]) 03/10/2019  . Paroxysmal supraventricular tachycardia  ([place]) 03/10/2019  . Contusion of left hip 03/06/2019  . Chest pain, moderate coronary artery risk 01/27/2019  . Melena   . Recurrent chest pain 01/19/2019  . Depression, major, single episode, moderate ([place]) 12/09/2018  . Closed nondisplaced intertrochanteric fracture of left femur ([place]) 10/12/2018  . Hypotension 06/26/2018  . Autonomic neuropathy 03/24/2018  . Acute delirium 03/03/2018  . H/O gastric bypass 03/02/2018  . Hypertension associated with stage 3 chronic kidney disease due to type 2 diabetes mellitus ([name]) 02/20/2018  . SI (sacroiliac) joint dysfunction 12/02/2017  . Unstable angina ([place]) 06/24/2017  . Syncope 04/09/2017  . Insomnia 03/18/2017  . Ischemic cardiomyopathy   . Arthritis   . Anxiety   . Tendinitis of upper biceps tendon of right shoulder 03/16/2017  . Gastroenteritis 01/27/2017  . Hx of colonic polyps   . H/O medication noncompliance 12/14/2015  . CAD (coronary artery disease)   . Hypertensive heart disease   . Status post bariatric surgery 06/04/2015  . Carotid stenosis 04/30/2015  . Stable angina pectoris ([place]) 04/17/2015  . Iron deficiency anemia 03/22/2015  . Vitamin B12 deficiency 02/18/2015  . Misuse of medications for pain 02/18/2015  . Major depressive disorder, recurrent, severe with psychotic features ([place]) 02/15/2015  . Helicobacter pylori infection 11/23/2014  . Malignant melanoma ([place]) 08/25/2014  . Incomplete bladder emptying 07/12/2014  . Hypothyroidism 12/30/2013  . Aberrant subclavian artery 11/17/2013  . Multiple sclerosis ([place]) 11/02/2013  . History of CVA with residual deficit 06/20/2013  . Headache, migraine 05/29/2013  . Hyperlipidemia   . GERD (gastroesophageal reflux disease)   . Neuropathy ([place]) 01/02/2011  . Stroke ([place]) 06/21/2008  . Depression with anxiety 05/01/2008  . Essential hypertension 05/01/2008    Past Surgical History:  Procedure Laterality Date  . APPENDECTOMY    . BALLOON ENTEROSCOPY  02/06/2020    [facility]  . BIOPSY N/A 03/14/2020   Procedure: BIOPSY;  Surgeon: [name], MD;  Location: [place];  Service: Endoscopy;  Laterality: N/A;  . CARDIAC CATHETERIZATION N/A 11/09/2014   Procedure: Coronary Angiography;  Surgeon: Minna Merritts, MD;  Location: [place] CV LAB;  Service: Cardiovascular;  Laterality: N/A;  . CARDIAC CATHETERIZATION N/A 11/12/2014   Procedure: Coronary Stent Intervention;  Surgeon: Isaias Cowman, MD;  Location: [place] [facility] LAB;  Service: Cardiovascular;  Laterality: N/A;  . CARDIAC CATHETERIZATION N/A 04/18/2015   Procedure: Left Heart Cath and Coronary Angiography;  Surgeon: Minna Merritts, MD;  Location: [place] CV LAB;  Service: Cardiovascular;  Laterality: N/A;  . CARDIAC CATHETERIZATION Left 06/04/2015   Procedure: Left Heart Cath and Coronary Angiography;  Surgeon: Wellington Hampshire, MD;  Location: [place] CV LAB;  Service: Cardiovascular;  Laterality: Left;  . CARDIAC CATHETERIZATION N/A 06/04/2015   Procedure: Coronary Stent Intervention;  Surgeon: Wellington Hampshire, MD;  Location: [place] CV LAB;  Service: Cardiovascular;  Laterality: N/A;  . CESAREAN SECTION  2001  . CHOLECYSTECTOMY N/A 11/18/2016   Procedure: LAPAROSCOPIC CHOLECYSTECTOMY WITH INTRAOPERATIVE CHOLANGIOGRAM;  Surgeon: Christene Lye, MD;  Location: [facility];  Service: General;  Laterality: N/A;  . COLONOSCOPY WITH PROPOFOL N/A 04/27/2016   Procedure: COLONOSCOPY WITH PROPOFOL;  Surgeon: [name], MD;  Location: [place];  Service: Endoscopy;  Laterality: N/A;  . COLONOSCOPY WITH PROPOFOL N/A 01/12/2018   Procedure: COLONOSCOPY WITH PROPOFOL;  Surgeon: Toledo, Benay Pike, MD;  Location: [facility];  Service: Endoscopy;  Laterality: N/A;  . COLONOSCOPY WITH PROPOFOL N/A 03/14/2020   Procedure: COLONOSCOPY WITH PROPOFOL;  Surgeon: [name], MD;  Location: [place];  Service: Endoscopy;  Laterality: N/A;  Needs to be  scheduled after 7:30 due to driver issues  . CORONARY ANGIOPLASTY    . CORONARY BALLOON ANGIOPLASTY N/A 06/29/2017   Procedure: CORONARY BALLOON ANGIOPLASTY;  Surgeon: Wellington Hampshire, MD;  Location: [place] CV LAB;  Service: Cardiovascular;  Laterality: N/A;  . CORONARY BALLOON ANGIOPLASTY N/A 09/20/2017   Procedure: CORONARY BALLOON ANGIOPLASTY;  Surgeon: Wellington Hampshire, MD;  Location: [place] CV LAB;  Service: Cardiovascular;  Laterality: N/A;  . CORONARY STENT INTERVENTION N/A 12/13/2019   Procedure: CORONARY STENT INTERVENTION;  Surgeon: Wellington Hampshire, MD;  Location: [place] CV LAB;  Service: Cardiovascular;  Laterality: N/A;  . DILATION AND CURETTAGE OF UTERUS    . ESOPHAGOGASTRODUODENOSCOPY (EGD) WITH PROPOFOL N/A 09/14/2014   Procedure: ESOPHAGOGASTRODUODENOSCOPY (EGD) WITH PROPOFOL;  Surgeon: Josefine Class, MD;  Location: [facility]OSCOPY;  Service: Endoscopy;  Laterality: N/A;  . ESOPHAGOGASTRODUODENOSCOPY (EGD) WITH PROPOFOL N/A 04/27/2016   Procedure: ESOPHAGOGASTRODUODENOSCOPY (EGD) WITH PROPOFOL;  Surgeon: [name], MD;  Location: [place];  Service: Endoscopy;  Laterality: N/A;  Diabetic - oral meds  . ESOPHAGOGASTRODUODENOSCOPY (EGD) WITH PROPOFOL N/A 01/12/2018   Procedure: ESOPHAGOGASTRODUODENOSCOPY (EGD) WITH PROPOFOL;  Surgeon: Toledo, Benay Pike, MD;  Location: [facility];  Service: Endoscopy;  Laterality: N/A;  . ESOPHAGOGASTRODUODENOSCOPY (EGD) WITH PROPOFOL N/A 04/11/2019   Procedure: ESOPHAGOGASTRODUODENOSCOPY (EGD) WITH PROPOFOL;  Surgeon: [name], MD;  Location: [facility];  Service: Endoscopy;  Laterality: N/A;  . ESOPHAGOGASTRODUODENOSCOPY (EGD) WITH PROPOFOL N/A 03/08/2020   Procedure: ESOPHAGOGASTRODUODENOSCOPY (EGD) WITH PROPOFOL;  Surgeon: [name], MD;  [facility];  Service: Endoscopy;  Laterality: N/A;  . GASTRIC BYPASS  09/2009   [place] (IM) NAIL  INTERTROCHANTERIC Left 10/13/2018   Procedure: INTRAMEDULLARY (IM) NAIL INTERTROCHANTRIC;  Surgeon: Leandrew Koyanagi, MD;  Location: [place];  Service: Orthopedics;  Laterality: Left;  . Left Carotid to sublcavian artery bypass w/ subclavian artery ligation     a. Performed @ [place].  . LEFT HEART CATH AND CORONARY ANGIOGRAPHY Left 06/29/2017   Procedure: LEFT HEART CATH AND CORONARY ANGIOGRAPHY;  Surgeon: Wellington Hampshire, MD;  Location: [place] CV LAB;  Service: Cardiovascular;  Laterality: Left;  . LEFT HEART CATH AND CORONARY ANGIOGRAPHY N/A 09/20/2017   Procedure: LEFT HEART CATH AND CORONARY ANGIOGRAPHY;  Surgeon: Wellington Hampshire, MD;  Location: [place] CV LAB;  Service: Cardiovascular;  Laterality: N/A;  . LEFT HEART CATH AND CORONARY ANGIOGRAPHY N/A 12/20/2017   Procedure: LEFT HEART CATH AND CORONARY ANGIOGRAPHY;  Surgeon: Wellington Hampshire, MD;  Location: [place] CV LAB;  Service: Cardiovascular;  Laterality: N/A;  . LEFT HEART CATH AND CORONARY ANGIOGRAPHY N/A 04/20/2019   Procedure: LEFT HEART CATH AND CORONARY ANGIOGRAPHY possible PCI;  Surgeon: Minna Merritts, MD;  Location: [place] CV LAB;  Service: Cardiovascular;  Laterality: N/A;  . LEFT HEART CATH AND CORONARY ANGIOGRAPHY N/A 12/13/2019  Procedure: LEFT HEART CATH AND CORONARY ANGIOGRAPHY;  Surgeon: Wellington Hampshire, MD;  Location: [place] CV LAB;  Service: Cardiovascular;  Laterality: N/A;  . MELANOMA EXCISION  2016   Dr. Evorn Gong  . [place]  2002  . RIGHT OOPHORECTOMY    . SHOULDER ARTHROSCOPY WITH OPEN ROTATOR CUFF REPAIR Right 01/07/2016   Procedure: SHOULDER ARTHROSCOPY WITH DEBRIDMENT, SUBACHROMIAL DECOMPRESSION;  Surgeon: Corky Mull, MD;  Location: [facility];  Service: Orthopedics;  Laterality: Right;  . SHOULDER ARTHROSCOPY WITH OPEN ROTATOR CUFF REPAIR Right 03/16/2017   Procedure: SHOULDER ARTHROSCOPY WITH OPEN ROTATOR CUFF REPAIR POSSIBLE BICEPS TENODESIS;  Surgeon: Corky Mull,  MD;  Location: [facility];  Service: Orthopedics;  Laterality: Right;  . TRIGGER FINGER RELEASE Right     [place]    Prior to Admission medications   Medication Sig Start Date End Date Taking? Authorizing Provider  HYDROcodone-acetaminophen (NORCO) 5-325 MG tablet Take 1 tablet by mouth every 4 (four) hours as needed for moderate pain. 06/09/20  Yes Naaman Plummer, MD  ondansetron (ZOFRAN ODT) 4 MG disintegrating tablet Take 1 tablet (4 mg total) by mouth every 8 (eight) hours as needed for nausea or vomiting. 06/09/20  Yes Naaman Plummer, MD  alosetron (LOTRONEX) 1 MG tablet Take 1 tablet (1 mg total) by mouth 2 (two) times daily. 03/18/20   [name], MD  ALPRAZolam Duanne Moron) 1 MG tablet Take 1 tablet by mouth twice daily as needed 02/16/20   [name], MD  buPROPion [facility] SR) 100 MG 12 hr tablet SMARTSIG:1 Tablet(s) By Mouth Every 12 Hours 05/03/20   [provider]  carvedilol (COREG) 12.5 MG tablet Take 1 tablet (12.5 mg total) by mouth 2 (two) times daily. 05/02/20 07/31/20  Deboraha Sprang, MD  clopidogrel (PLAVIX) 75 MG tablet Take 75 mg by mouth daily.    [provider]  desvenlafaxine (PRISTIQ) 50 MG 24 hr tablet Take 1 tablet (50 mg total) by mouth daily. 05/03/20   [name], MD  diphenoxylate-atropine (LOMOTIL) 2.5-0.025 MG tablet Take 1 tablet by mouth 4 (four) times daily as needed for diarrhea or loose stools. 05/07/20   [name], MD  Erenumab-aooe 140 MG/ML SOAJ Inject into the skin. 05/10/20   [provider]  estradiol (ESTRACE) 0.1 MG/GM vaginal cream Estrogen Cream Instruction Discard applicator Apply pea sized amount to tip of finger to urethra before bed. Wash hands well after application. Use Monday, Wednesday and Friday 05/08/20   Billey Co, MD  EUTHYROX 25 MCG tablet TAKE 1 TABLET BY MOUTH ONCE DAILY BEFORE BREAKFAST 12/02/19   [name], MD  famotidine (PEPCID) 20 MG tablet Take 20 mg by mouth 2 (two) times daily.     [provider]  gabapentin (NEURONTIN) 300 MG capsule Take 2 capsules (600 mg total) by mouth 2 (two) times daily. 01/17/20   [name], MD  glucose 4 GM chewable tablet Chew 1 tablet (4 g total) by mouth 3 (three) times daily. 04/25/20   Fritzi Mandes, MD  isosorbide mononitrate (IMDUR) 120 MG 24 hr tablet Take 1 tablet (120 mg total) by mouth daily. 03/20/20   Minna Merritts, MD  memantine (NAMENDA) 5 MG tablet Take 5 mg by mouth 2 (two) times daily. 05/03/20   [provider]  nitrofurantoin, macrocrystal-monohydrate, (MACROBID) 100 MG capsule Take 1 capsule (100 mg total) by mouth daily. For 3 months. 05/14/20   Billey Co, MD  nitroGLYCERIN (NITROSTAT)  0.4 MG SL tablet Place 1 tablet (0.4 mg total) under the tongue every 5 (five) minutes as needed for chest pain. 10/20/19   Dwyane Dee, MD  Phenazopyridine HCl (AZO TABS PO) Take 2 tablets by mouth daily.    [provider]  Polyethyl Glycol-Propyl Glycol (SYSTANE OP) Place 1 drop into both eyes daily as needed (dry eyes).    [provider]  promethazine (PHENERGAN) 25 MG tablet TAKE 1 TABLET BY MOUTH EVERY 6 HOURS AS NEEDED FOR NAUSEA FOR VOMITING 05/22/20   [name], MD  rosuvastatin (CRESTOR) 40 MG tablet Take 1 tablet (40 mg total) by mouth daily at 6 PM. 10/25/19   Gollan, Kathlene November, MD  traZODone (DESYREL) 100 MG tablet Take 1-2 tablets (100-200 mg total) by mouth at bedtime. 05/31/20   [name], MD  Ubrogepant (UBRELVY) 100 MG TABS Take by mouth. 05/03/20   [provider]    Allergies Lipitor [atorvastatin]  Family History  Problem Relation Age of Onset  . Hypertension Mother   . Anxiety disorder Mother   . Depression Mother   . Bipolar disorder Mother   . Heart disease Mother        No details  . Hyperlipidemia Mother   . Kidney disease Father   . Heart disease Father 39  . Hypertension Father   . Diabetes Father   . Stroke Father   . Colon cancer Father         dx in his 1's  . Anxiety disorder Father   . Depression Father   . Skin cancer Father   . Kidney disease Sister   . Thyroid nodules Sister   . Hypertension Sister   . Hypertension Sister   . Diabetes Sister   . Hyperlipidemia Sister   . Depression Sister   . Breast cancer Maternal Aunt 67  . Breast cancer Maternal Aunt 39  . Ovarian cancer Cousin   . Colon cancer Cousin   . Kidney cancer Cousin   . Breast cancer Other   . Bladder Cancer Neg Hx     Social History Social History   Tobacco Use  . Smoking status: Former Smoker    Packs/day: 0.25    Years: 1.00    Pack years: 0.25    Types: Cigarettes    Quit date: 08/31/1994    Years since quitting: 25.7  . Smokeless tobacco: Never Used  . Tobacco comment: quit 28 years ago  Vaping Use  . Vaping Use: Never used  Substance Use Topics  . Alcohol use: No    Alcohol/week: 0.0 standard drinks  . Drug use: No    Review of Systems Constitutional: No fever/chills Eyes: No visual changes. ENT: No sore throat. Cardiovascular: Denies chest pain. Respiratory: Denies shortness of breath. Gastrointestinal: No abdominal pain.  No nausea, no vomiting.  No diarrhea. Genitourinary: Negative for dysuria. Musculoskeletal: Endorses right facial pain/hematoma and right wrist pain Skin: Negative for rash. Neurological: Positive for headaches, negative for weakness/numbness/paresthesias in any extremity Psychiatric: Negative for suicidal ideation/homicidal ideation   ____________________________________________   PHYSICAL EXAM:  VITAL SIGNS: ED Triage Vitals  Enc Vitals Group     BP 06/09/20 1057 (!) 203/81     Pulse Rate 06/09/20 1057 67     Resp 06/09/20 1057 16     Temp 06/09/20 1057 98.4 F (36.9 C)     Temp Source 06/09/20 1057 Oral     SpO2 06/09/20 1057 100 %  Weight 06/09/20 1058 144 lb (65.3 kg)     Height 06/09/20 1058 5' 3" (1.6 m)     Head Circumference --      Peak Flow --      Pain Score 06/09/20  1058 10     Pain Loc --      Pain Edu? --      Excl. in [place]? --    Constitutional: Alert and oriented. Well appearing and in no acute distress. Eyes: Conjunctivae are normal. PERRL. Head: Large right facial hematoma including the periorbital area to the mandible with surrounding ecchymosis Nose: No congestion/rhinnorhea. Mouth/Throat: Mucous membranes are moist. Neck: No stridor Cardiovascular: Grossly normal heart sounds.  Good peripheral circulation. Respiratory: Normal respiratory effort.  No retractions. Gastrointestinal: Soft and nontender. No distention. Musculoskeletal: No obvious deformities.  Painful range of motion at the lateral right wrist Neurologic:  Normal speech and language. No gross focal neurologic deficits are appreciated. Skin:  Skin is warm and dry. No rash noted. Psychiatric: Mood and affect are normal. Speech and behavior are normal.  ____________________________________________   LABS (all labs ordered are listed, but only abnormal results are displayed)  Labs Reviewed  CBC - Abnormal; Notable for the following components:      Result Value   Hemoglobin 11.0 (*)    HCT 35.3 (*)    MCH 25.1 (*)    All other components within normal limits  BASIC METABOLIC PANEL - Abnormal; Notable for the following components:   Potassium 5.4 (*)    CO2 21 (*)    Creatinine, Ser 1.35 (*)    GFR, Estimated 48 (*)    All other components within normal limits   ____________________________________________  EKG  ED ECG REPORT I, Naaman Plummer, the attending physician, personally viewed and interpreted this ECG.  Date: 06/09/2020 EKG Time: 1101 Rate: 66 Rhythm: normal sinus rhythm QRS Axis: normal Intervals: normal ST/T Wave abnormalities: normal Narrative Interpretation: no evidence of acute ischemia  ____________________________________________  RADIOLOGY  ED MD interpretation: CT of the head and maxillofacial structures without contrast shows large right  facial hematoma with extensive soft tissue swelling extending in the level of the mandible without any underlying fractures.  This soft tissue swelling does extend to the periorbital region without any orbital injury or underlying fracture.  No evidence of acute intracranial abnormalities  Official radiology report(s): CT Head Wo Contrast  Result Date: 06/09/2020 CLINICAL DATA:  Fall.  Facial trauma.  Right-sided facial swelling. EXAM: CT HEAD WITHOUT CONTRAST CT MAXILLOFACIAL WITHOUT CONTRAST TECHNIQUE: Multidetector CT imaging of the head and maxillofacial structures were performed using the standard protocol without intravenous contrast. Multiplanar CT image reconstructions of the maxillofacial structures were also generated. COMPARISON:  None. FINDINGS: CT HEAD FINDINGS Brain: Mild atrophy and white matter changes are stable. No acute infarct, hemorrhage, or mass lesion is present. The ventricles are of normal size. Remote lacunar infarcts noted within the genu of the right internal capsule. Remote lacunar infarcts present in the thalami bilaterally, stable. The brainstem and cerebellum are within normal limits. No significant extraaxial fluid collection is present. Vascular: Atherosclerotic calcifications are present within the cavernous internal carotid arteries. No hyperdense vessel is present. Skull: Calvarium is intact. No focal lytic or blastic lesions are present. CT MAXILLOFACIAL FINDINGS Osseous: No acute fracture is present. Mandible is intact and located. Dental implant anchors noted. Orbits: Right periorbital hematoma present. Globes are intact. Intraorbital injury present. Bilateral lens replacements noted. Sinuses: Paranasal sinuses and mastoid air cells  are clear. Soft tissues: Right facial hematoma present with extensive soft tissue swelling extending to the level of the mandible. Diffuse periorbital swelling noted. No underlying fracture. IMPRESSION: 1. Right facial hematoma with extensive  soft tissue swelling extending to the level of the mandible. 2. No underlying fracture. 3. Soft tissue swelling extends to the periorbital region. No orbital injury or underlying fracture. 4. Stable atrophy and white matter disease. 5. Remote lacunar infarcts of the thalami bilaterally and genu of the right internal capsule. 6. No acute intracranial abnormality or significant interval change. Electronically Signed   By: San Morelle M.D.   On: 06/09/2020 12:11   CT Maxillofacial Wo Contrast  Result Date: 06/09/2020 CLINICAL DATA:  Fall.  Facial trauma.  Right-sided facial swelling. EXAM: CT HEAD WITHOUT CONTRAST CT MAXILLOFACIAL WITHOUT CONTRAST TECHNIQUE: Multidetector CT imaging of the head and maxillofacial structures were performed using the standard protocol without intravenous contrast. Multiplanar CT image reconstructions of the maxillofacial structures were also generated. COMPARISON:  None. FINDINGS: CT HEAD FINDINGS Brain: Mild atrophy and white matter changes are stable. No acute infarct, hemorrhage, or mass lesion is present. The ventricles are of normal size. Remote lacunar infarcts noted within the genu of the right internal capsule. Remote lacunar infarcts present in the thalami bilaterally, stable. The brainstem and cerebellum are within normal limits. No significant extraaxial fluid collection is present. Vascular: Atherosclerotic calcifications are present within the cavernous internal carotid arteries. No hyperdense vessel is present. Skull: Calvarium is intact. No focal lytic or blastic lesions are present. CT MAXILLOFACIAL FINDINGS Osseous: No acute fracture is present. Mandible is intact and located. Dental implant anchors noted. Orbits: Right periorbital hematoma present. Globes are intact. Intraorbital injury present. Bilateral lens replacements noted. Sinuses: Paranasal sinuses and mastoid air cells are clear. Soft tissues: Right facial hematoma present with extensive soft tissue  swelling extending to the level of the mandible. Diffuse periorbital swelling noted. No underlying fracture. IMPRESSION: 1. Right facial hematoma with extensive soft tissue swelling extending to the level of the mandible. 2. No underlying fracture. 3. Soft tissue swelling extends to the periorbital region. No orbital injury or underlying fracture. 4. Stable atrophy and white matter disease. 5. Remote lacunar infarcts of the thalami bilaterally and genu of the right internal capsule. 6. No acute intracranial abnormality or significant interval change. Electronically Signed   By: San Morelle M.D.   On: 06/09/2020 12:11    ____________________________________________   PROCEDURES  Procedure(s) performed (including Critical Care):  .1-3 Lead EKG Interpretation Performed by: Naaman Plummer, MD Authorized by: Naaman Plummer, MD     Interpretation: normal     ECG rate:  62   ECG rate assessment: normal     Rhythm: sinus rhythm     Ectopy: none     Conduction: normal       ____________________________________________   INITIAL IMPRESSION / ASSESSMENT AND PLAN / ED COURSE  As part of my medical decision making, I reviewed the following data within the [facility] notes reviewed and incorporated, Labs reviewed, EKG interpreted, Old chart reviewed, Radiograph reviewed and Notes from prior ED visits reviewed and incorporated      Patient presenting with head trauma.  Patient's neurological exam was non-focal and unremarkable.  [place] Head CT Rule was applied and patient did not fall into the low risk category so a head CT was obtained.  This showed no significant findings.  At this time, it is felt that the most likely  explanation for the patient's symptoms is concussion.   I also considered SAH, SDH, Epidural Hematoma, IPH, skull fracture, migraine but this appears less likely considering the data gathered thus far.   Patient provided analgesia.   Patient  remained stable and neurologically intact while in the emergency department.  Discussed warning signs that would prompt return to ED.  Head trauma handout was provided.  Discussed in detail concussion management.  No sports or strenuous activity until symptoms free.  Return to emergency department urgently if new or worsening symptoms develop.    Impression:  Concussion Facial hematoma Right wrist pain  Plan  Discharge from ED Tylenol for pain control. Avoid aspirin, NSAIDs, or other blood thinners. Advised patient on supportive measures for cognitive rest - avoid use of cognitive function for at least48 hours.  This means no tv, books, texting, computers, etc. Limit visitors to the house.  Head trauma instructions provided in discharge instructions Instructed Pt to monitor for neurologic symptoms, severe HA, change in mental status, seizures, loss of conciousness. Instructed Pt to f/up w/ PCP in 3 days or ETC should symptoms worsen or not improve. Pt verbally expressed understanding and all questions were addressed to Pt's satisfaction.      ____________________________________________   FINAL CLINICAL IMPRESSION(S) / ED DIAGNOSES  Final diagnoses:  Contusion of face, initial encounter  Facial hematoma, initial encounter  Concussion without loss of consciousness, initial encounter     ED Discharge Orders         Ordered    HYDROcodone-acetaminophen (NORCO) 5-325 MG tablet  Every 4 hours PRN        06/09/20 1325    ondansetron (ZOFRAN ODT) 4 MG disintegrating tablet  Every 8 hours PRN        06/09/20 1325           Note:  This document was prepared using Dragon voice recognition software and may include unintentional dictation errors.   Naaman Plummer, MD 06/09/20 [phone]

## 2020-06-09 NOTE — ED Triage Notes (Addendum)
Pt arrives to ER via [facility] from home for mechanical fall. Hit face on wall. 66 HR, 200/100, 100% RA. Was walking to take BP meds (did not take BP meds). R sided facial swelling. Denies LOC. Takes plavix for hx stroke.

## 2020-06-10 ENCOUNTER — Encounter: Payer: Self-pay | Admitting: Adult Health

## 2020-06-12 ENCOUNTER — Ambulatory Visit: Payer: Self-pay | Admitting: *Deleted

## 2020-06-12 MED ORDER — HYDROCODONE-ACETAMINOPHEN 5-325 MG PO TABS: 1.0000 | ORAL_TABLET | ORAL | 0 refills | Status: DC | PRN

## 2020-06-12 NOTE — Telephone Encounter (Signed)
Patient is requesting refill on pain medication she is currently taking otc Tylenol, I was looking at providers schedule and I do not see an opening anywhere for hopstial follow up, will Dr. Caryn Section review schedule to see if appointment can be made for her to follow up? KW

## 2020-06-12 NOTE — Telephone Encounter (Signed)
Have sent prescription to [facility]. Can put her in the 3:40am slot Monday the 16th.

## 2020-06-12 NOTE — Telephone Encounter (Signed)
S/P fall at home on 06/09/20. Tripped over a board, hit the wall with her face. Seen in the ED that day, had a mild concussion per CT Scan. Rt eye still swollen shut and bruised along with the cheek. Rates face pain 10 on the scale. Denies SOB/CP/dizziness/vomiting/bleeding and no drowsiness today.  Treated with 5 oxycodone tabs at the ED and now taking Tylenol ES Q4 hours. Advised continued use of ice to the face and Tylenol today. No available appointments-patient is requesting pain medication as she reports tylenol is not helping. With any confusion/drowsiness/dizziness/bleeding/ vomiting-go back to the ED for evaluation.  Routing to provider.  Pharmacy on file

## 2020-06-12 NOTE — Addendum Note (Signed)
Addended by: [name] E on: 06/12/2020 11:24 AM   Modules accepted: Orders

## 2020-06-12 NOTE — Telephone Encounter (Signed)
  Reason for Disposition . [1] Fall AND [2] went to emergency department for evaluation or treatment  Additional Information . Negative: Injury (or injuries) that need emergency care    She was seen at the ED immediately.  Answer Assessment - Initial Assessment Questions 1. MECHANISM: "How did the fall happen?"     Tripped over a board at home 2. DOMESTIC VIOLENCE AND ELDER ABUSE SCREENING: "Did you fall because someone pushed you or tried to hurt you?" If Yes, ask: "Are you safe now?"     no 3. ONSET: "When did the fall happen?" (e.g., minutes, hours, or days ago)     06/09/20 Sunday morning. 4. LOCATION: "What part of the body hit the ground?" (e.g., back, buttocks, head, hips, knees, hands, head, stomach)     Face and head hit the wall. 5. INJURY: "Did you hurt (injure) yourself when you fell?" If Yes, ask: "What did you injure? Tell me more about this?" (e.g., body area; type of injury; pain severity)"     Bruised right eye and right side of face. 6. PAIN: "Is there any pain?" If Yes, ask: "How bad is the pain?" (e.g., Scale 1-10; or mild,  moderate, severe)   - NONE (0): No pain   - MILD (1-3): Doesn\'t interfere with normal activities    - MODERATE (4-7): Interferes with normal activities or awakens from sleep    - SEVERE (8-10): Excruciating pain, unable to do any normal activities      10  7. SIZE: For cuts, bruises, or swelling, ask: "How large is it?" (e.g., inches or centimeters)      Face bruising 8. PREGNANCY: "Is there any chance you are pregnant?" "When was your last menstrual period?"     na 9. OTHER SYMPTOMS: "Do you have any other symptoms?" (e.g., dizziness, fever, weakness; new onset or worsening).      no 10. CAUSE: "What do you think caused the fall (or falling)?" (e.g., tripped, dizzy spell)       *No Answer*  Protocols used: FALLS AND FALLING-A-AH

## 2020-06-12 NOTE — Telephone Encounter (Signed)
Patient advised. Appointment scheduled.  

## 2020-06-17 ENCOUNTER — Ambulatory Visit
Admission: RE | Admit: 2020-06-17 | Discharge: 2020-06-17 | Disposition: A | Discharge status: Home / Self Care | Payer: Medicare Other | Source: Ambulatory Visit | Attending: Family Medicine | Admitting: Family Medicine

## 2020-06-17 ENCOUNTER — Other Ambulatory Visit: Payer: Self-pay

## 2020-06-17 ENCOUNTER — Telehealth (INDEPENDENT_AMBULATORY_CARE_PROVIDER_SITE_OTHER): Payer: Medicare Other | Admitting: Family Medicine

## 2020-06-17 ENCOUNTER — Ambulatory Visit: Payer: Self-pay | Admitting: Family Medicine

## 2020-06-17 DIAGNOSIS — M25521 Pain in right elbow: Secondary | ICD-10-CM | POA: Diagnosis present

## 2020-06-17 DIAGNOSIS — R400 Somnolence: Secondary | ICD-10-CM

## 2020-06-17 DIAGNOSIS — G44311 Acute post-traumatic headache, intractable: Secondary | ICD-10-CM | POA: Insufficient documentation

## 2020-06-17 DIAGNOSIS — H53143 Visual discomfort, bilateral: Secondary | ICD-10-CM | POA: Diagnosis not present

## 2020-06-17 DIAGNOSIS — R519 Headache, unspecified: Secondary | ICD-10-CM

## 2020-06-17 MED ORDER — ISOMETHEPTENE-DICHLORAL-APAP 65-100-325 MG PO CAPS: 1.0000 | ORAL_CAPSULE | Freq: Three times a day (TID) | ORAL | 0 refills | Status: DC | PRN

## 2020-06-17 NOTE — Progress Notes (Signed)
MyChart Video Visit    Virtual Visit via Video Note   This visit type was conducted due to national recommendations for restrictions regarding the COVID-19 Pandemic (e.g. social distancing) in an effort to limit this patient's exposure and mitigate transmission in our community. This patient is at least at moderate risk for complications without adequate follow up. This format is felt to be most appropriate for this patient at this time. Physical exam was limited by quality of the video and audio technology used for the visit.   Patient location: home Provider location: [facility]  I discussed the limitations of evaluation and management by telemedicine and the availability of in person appointments. The patient expressed understanding and agreed to proceed.  Patient: [name]   DOB: October 07, 1970   50 y.o. Female  MRN: [id number] Visit Date: 06/17/2020  Today's healthcare provider: [name], MD   No chief complaint on file.  Subjective    HPI  Patient fell and hit head 8 days ago. Patient complains of bruise, nausea, severe headaches, vomiting when she eats, swollen and sensitivity to light. Patient is taking pain medication and tylenol for the pain with minimal relief. Is feeling more sleepy and fatigued over the last several days. Headache is pounding. Headache is mainly on left and along midline. Also very painful and difficult to use left arm around elbow.     Medications: Outpatient Medications Prior to Visit  Medication Sig  . alosetron (LOTRONEX) 1 MG tablet Take 1 tablet (1 mg total) by mouth 2 (two) times daily.  Marland Kitchen ALPRAZolam (XANAX) 1 MG tablet Take 1 tablet by mouth twice daily as needed  . buPROPion (WELLBUTRIN SR) 100 MG 12 hr tablet SMARTSIG:1 Tablet(s) By Mouth Every 12 Hours  . carvedilol (COREG) 12.5 MG tablet Take 1 tablet (12.5 mg total) by mouth 2 (two) times daily.  . clopidogrel (PLAVIX) 75 MG tablet Take 75 mg by mouth daily.  Marland Kitchen desvenlafaxine  (PRISTIQ) 50 MG 24 hr tablet Take 1 tablet (50 mg total) by mouth daily.  . diphenoxylate-atropine (LOMOTIL) 2.5-0.025 MG tablet Take 1 tablet by mouth 4 (four) times daily as needed for diarrhea or loose stools.  Eduard Roux 140 MG/ML SOAJ Inject into the skin.  Marland Kitchen estradiol (ESTRACE) 0.1 MG/GM vaginal cream Estrogen Cream Instruction Discard applicator Apply pea sized amount to tip of finger to urethra before bed. Wash hands well after application. Use Monday, Wednesday and Friday  . EUTHYROX 25 MCG tablet TAKE 1 TABLET BY MOUTH ONCE DAILY BEFORE BREAKFAST  . famotidine (PEPCID) 20 MG tablet Take 20 mg by mouth 2 (two) times daily.  Marland Kitchen gabapentin (NEURONTIN) 300 MG capsule Take 2 capsules (600 mg total) by mouth 2 (two) times daily.  Marland Kitchen glucose 4 GM chewable tablet Chew 1 tablet (4 g total) by mouth 3 (three) times daily.  Marland Kitchen HYDROcodone-acetaminophen (NORCO) 5-325 MG tablet Take 1 tablet by mouth every 4 (four) hours as needed for moderate pain.  . isosorbide mononitrate (IMDUR) 120 MG 24 hr tablet Take 1 tablet (120 mg total) by mouth daily.  . memantine (NAMENDA) 5 MG tablet Take 5 mg by mouth 2 (two) times daily.  . nitrofurantoin, macrocrystal-monohydrate, (MACROBID) 100 MG capsule Take 1 capsule (100 mg total) by mouth daily. For 3 months.  . nitroGLYCERIN (NITROSTAT) 0.4 MG SL tablet Place 1 tablet (0.4 mg total) under the tongue every 5 (five) minutes as needed for chest pain.  Marland Kitchen ondansetron (ZOFRAN ODT) 4 MG disintegrating  tablet Take 1 tablet (4 mg total) by mouth every 8 (eight) hours as needed for nausea or vomiting.  Marland Kitchen Phenazopyridine HCl (AZO TABS PO) Take 2 tablets by mouth daily.  Vladimir Faster Glycol-Propyl Glycol (SYSTANE OP) Place 1 drop into both eyes daily as needed (dry eyes).  . promethazine (PHENERGAN) 25 MG tablet TAKE 1 TABLET BY MOUTH EVERY 6 HOURS AS NEEDED FOR NAUSEA FOR VOMITING  . rosuvastatin (CRESTOR) 40 MG tablet Take 1 tablet (40 mg total) by mouth daily at 6 PM.   . traZODone (DESYREL) 100 MG tablet Take 1-2 tablets (100-200 mg total) by mouth at bedtime.  Marland Kitchen Ubrogepant (UBRELVY) 100 MG TABS Take by mouth.   No facility-administered medications prior to visit.    Review of Systems  Eyes: Positive for photophobia, pain and itching.  Gastrointestinal: Positive for nausea and vomiting.  Neurological: Positive for headaches.  Hematological: Bruises/bleeds easily.      Objective    There were no vitals taken for this visit.   Physical Exam   Awake, alert, oriented x 3. In no apparent distress   Assessment & Plan     1. Somnolence   2. Photophobia of both eyes   3. Acute intractable headache, unspecified headache type   4. Intractable acute post-traumatic headache Had negative head CT immediately after fall, but symptoms have progressed concerning for subdural hematoma. Neuroimaging needed to rule out.  stat CT Head Wo Contrast; Future  5. Right elbow pain  - DG Elbow Complete Right; Future  Other orders - isometheptene-acetaminophen-dichloralphenazone (MIDRIN) 65-100-325 MG capsule; Take 1-2 capsules by mouth every 8 (eight) hours as needed for headache. MAXIMUM 8 capsules/day  Dispense: 30 capsule; Refill: 0 to take only if head CT is negative.       I discussed the assessment and treatment plan with the patient. The patient was provided an opportunity to ask questions and all were answered. The patient agreed with the plan and demonstrated an understanding of the instructions.   The patient was advised to call back or seek an in-person evaluation if the symptoms worsen or if the condition fails to improve as anticipated.  Video connection was lost when less than 50% of the duration of the visit was complete, at which time the remainder of the visit was completed via audio only.  I provided 12 minutes of non-face-to-face time during this encounter.  The entirety of the information documented in the History of Present  Illness, Review of Systems and Physical Exam were personally obtained by me. Portions of this information were initially documented by the CMA and reviewed by me for thoroughness and accuracy.     [name], MD [facility] [phone] (phone) [phone] (fax)  [place]

## 2020-06-18 ENCOUNTER — Other Ambulatory Visit: Payer: Self-pay | Admitting: Family Medicine

## 2020-06-18 NOTE — Telephone Encounter (Signed)
Requested medication (s) are due for refill today:  yes  Requested medication (s) are on the active medication list:yes  Last refill:  05/23/2020  Future visit scheduled:no  Notes to clinic: this refill cannot be delegated    Requested Prescriptions  Pending Prescriptions Disp Refills   promethazine (PHENERGAN) 25 MG tablet [Pharmacy Med Name: Promethazine HCl 25 MG Oral Tablet] 30 tablet 0    Sig: TAKE 1 TABLET BY MOUTH EVERY 6 HOURS AS NEEDED FOR NAUSEA OR VOMITING      Not Delegated - Gastroenterology: Antiemetics Failed - 06/18/2020 11:12 AM      Failed - This refill cannot be delegated      Passed - Valid encounter within last 6 months    Recent Outpatient Visits           Yesterday Somnolence   [facility] [name], MD   2 months ago [facility] Flinchum, Kelby Aline, FNP   3 months ago Iron deficiency anemia due to chronic blood loss   [facility] [name], MD   4 months ago Anemia, unspecified type   [facility] [name], MD   5 months ago Dyspnea on exertion   [facility] [name], MD       Future Appointments             In 4 months Deboraha Sprang, MD [facility], [place]

## 2020-06-19 ENCOUNTER — Other Ambulatory Visit: Payer: Self-pay | Admitting: Urology

## 2020-06-19 ENCOUNTER — Encounter: Payer: Self-pay | Admitting: Family Medicine

## 2020-06-19 ENCOUNTER — Other Ambulatory Visit: Payer: Self-pay | Admitting: Family Medicine

## 2020-06-19 DIAGNOSIS — R519 Headache, unspecified: Secondary | ICD-10-CM

## 2020-06-19 DIAGNOSIS — S0990XA Unspecified injury of head, initial encounter: Secondary | ICD-10-CM

## 2020-06-19 MED ORDER — BUTALBITAL-APAP-CAFFEINE 50-325-40 MG PO TABS: 1.0000 | ORAL_TABLET | Freq: Four times a day (QID) | ORAL | 0 refills | Status: DC | PRN

## 2020-06-20 ENCOUNTER — Encounter: Payer: Self-pay | Admitting: Family Medicine

## 2020-06-21 ENCOUNTER — Encounter: Payer: Self-pay | Admitting: Family Medicine

## 2020-06-26 ENCOUNTER — Ambulatory Visit: Payer: Self-pay | Admitting: Family Medicine

## 2020-06-28 ENCOUNTER — Other Ambulatory Visit: Payer: Self-pay | Admitting: Family Medicine

## 2020-06-28 DIAGNOSIS — G629 Polyneuropathy, unspecified: Secondary | ICD-10-CM

## 2020-06-28 DIAGNOSIS — F419 Anxiety disorder, unspecified: Secondary | ICD-10-CM

## 2020-06-28 NOTE — Telephone Encounter (Signed)
Requested medications are due for refill today yes  Requested medications are on the active medication list yes  Last refill 5/4 (mail order)  Last visit 09/2019  Future visit scheduled 08/2020  Notes to clinic Xanax not Delegated. Pristiq failed protocol of visit within 6 months.

## 2020-07-02 ENCOUNTER — Other Ambulatory Visit: Payer: Self-pay | Admitting: Family Medicine

## 2020-07-02 ENCOUNTER — Telehealth: Payer: Self-pay | Admitting: Gastroenterology

## 2020-07-02 ENCOUNTER — Other Ambulatory Visit: Payer: Self-pay

## 2020-07-02 ENCOUNTER — Other Ambulatory Visit: Payer: Self-pay | Admitting: Urology

## 2020-07-02 MED ORDER — PROMETHAZINE HCL 25 MG PO TABS: 25.0000 mg | ORAL_TABLET | Freq: Four times a day (QID) | ORAL | 11 refills | Status: DC | PRN

## 2020-07-02 MED ORDER — ISOSORBIDE MONONITRATE ER 120 MG PO TB24: 120.0000 mg | ORAL_TABLET | Freq: Every day | ORAL | 4 refills | Status: DC

## 2020-07-02 NOTE — Telephone Encounter (Signed)
[facility] calling from [place] is calling to check on the status of the refill request. CB- [phone] referral number [phone]

## 2020-07-02 NOTE — Addendum Note (Signed)
Addended by: Ashley Royalty E on: 07/02/2020 01:49 PM   Modules accepted: Orders

## 2020-07-02 NOTE — Telephone Encounter (Signed)
30 day refill  diphenoxylate-atropine (LOMOTIL) 2.5-0.025 MG tablet

## 2020-07-03 ENCOUNTER — Other Ambulatory Visit: Payer: Self-pay

## 2020-07-03 MED ORDER — DIPHENOXYLATE-ATROPINE 2.5-0.025 MG PO TABS: 1.0000 | ORAL_TABLET | Freq: Four times a day (QID) | ORAL | 5 refills | Status: DC | PRN

## 2020-07-03 NOTE — Telephone Encounter (Signed)
Refill for Lomotil has been printed and waiting for a signature from Dr. Allen Norris. Pt advised.

## 2020-07-04 ENCOUNTER — Other Ambulatory Visit: Payer: Self-pay

## 2020-07-04 ENCOUNTER — Ambulatory Visit (INDEPENDENT_AMBULATORY_CARE_PROVIDER_SITE_OTHER): Payer: Medicare Other | Admitting: Urology

## 2020-07-04 ENCOUNTER — Encounter: Payer: Self-pay | Admitting: Urology

## 2020-07-04 VITALS — BP 172/108 | HR 85 | Ht 63.0 in | Wt 147.0 lb

## 2020-07-04 DIAGNOSIS — R339 Retention of urine, unspecified: Secondary | ICD-10-CM | POA: Diagnosis not present

## 2020-07-04 DIAGNOSIS — N390 Urinary tract infection, site not specified: Secondary | ICD-10-CM

## 2020-07-04 LAB — BLADDER SCAN AMB NON-IMAGING

## 2020-07-04 MED ORDER — LIDOCAINE HCL URETHRAL/MUCOSAL 2 % EX GEL
1.0000 "application " | Freq: Once | CUTANEOUS | Status: AC
  Administered 2020-07-04: 1 via URETHRAL

## 2020-07-04 NOTE — Progress Notes (Signed)
In and Out Catheterization  Patient is present today for a I & O catheterization due to urinary retention. Patient was cleaned and prepped in a sterile fashion with betadine. A 14FR cath was inserted no complications were noted, 370ml of urine return was noted, urine was pale yellow in color. Bladder was drained catheter was then removed with out difficulty.    Preformed by: Gordy Clement, CMA   Follow up/ Additional notes: RTC in 3 months

## 2020-07-04 NOTE — Progress Notes (Signed)
Cystoscopy Procedure Note:  Indication: Recurrent UTIs, dysuria/pelvic pain, incomplete bladder emptying  Urinalysis relatively benign today with 6-10 WBCs, 0-2 RBCs, moderate bacteria, nitrite negative, no leukocytes.  Will send for culture  She reports her dysuria has almost completely resolved, and her primary issue is feeling of incomplete emptying and difficulty catheterizing with her broken elbow.  She was previously performing CIC 4 times a day, but now is down to 1 or 2 times.  After informed consent and discussion of the procedure and its risks, [place] was positioned and prepped in the standard fashion. Cystoscopy was performed with a flexible cystoscope. The urethra, bladder neck and entire bladder was visualized in a standard fashion. The bladder mucosa was grossly normal throughout with mild trabeculations but no suspicious lesions.  No abnormalities on retroflexion. The ureteral orifices were visualized in their normal location and orientation.  No abnormal urethral findings on pullback ureteroscopy.  After cystoscopy, she was straight cathed to empty the bladder.  See CMA note for details.  Imaging: CT December 2021 with no renal or bladder abnormalities  Findings: Incomplete bladder emptying, normal cystoscopy  Assessment and Plan: Suspect incomplete bladder emptying as the etiology of her pelvic pain and pressure, as well as recurrent UTIs.  I recommended continuing CIC 3-4 times per day as able as her elbow heals, continuing topical estrogen cream and prophylactic antibiotics for the time being  She may benefit from evaluation with Dr. Matilde Sprang in the future or urodynamics  RTC with PA 3 months  Nickolas Madrid, MD 07/04/2020

## 2020-07-05 ENCOUNTER — Telehealth: Payer: Self-pay | Admitting: Family Medicine

## 2020-07-05 ENCOUNTER — Other Ambulatory Visit: Payer: Self-pay

## 2020-07-05 DIAGNOSIS — N390 Urinary tract infection, site not specified: Secondary | ICD-10-CM

## 2020-07-05 LAB — MICROSCOPIC EXAMINATION

## 2020-07-05 LAB — URINALYSIS, COMPLETE
Bilirubin, UA: NEGATIVE
Glucose, UA: NEGATIVE
Ketones, UA: NEGATIVE
Leukocytes,UA: NEGATIVE
Nitrite, UA: NEGATIVE
RBC, UA: NEGATIVE
Specific Gravity, UA: <1.005 — ABNORMAL LOW (ref 1.005–1.030)
Urobilinogen, Ur: 0.2 mg/dL (ref 0.2–1.0)
pH, UA: 5 (ref 5.0–7.5)

## 2020-07-05 MED ORDER — NITROFURANTOIN MONOHYD MACRO 100 MG PO CAPS: 100.0000 mg | ORAL_CAPSULE | Freq: Every day | ORAL | 0 refills | Status: DC

## 2020-07-05 NOTE — Telephone Encounter (Signed)
Pt called to report that she was dizzy today. She thought that she had an appt with Dr. Caryn Section today. However, the pt denied wanting to speak to NT.

## 2020-07-05 NOTE — Telephone Encounter (Signed)
FYI

## 2020-07-05 NOTE — Telephone Encounter (Signed)
Per pt request rx sent to [facility], see mychart message.

## 2020-07-08 LAB — CULTURE, URINE COMPREHENSIVE

## 2020-07-11 ENCOUNTER — Other Ambulatory Visit: Payer: Self-pay

## 2020-07-11 DIAGNOSIS — G629 Polyneuropathy, unspecified: Secondary | ICD-10-CM

## 2020-07-11 MED ORDER — GABAPENTIN 300 MG PO CAPS: 600.0000 mg | ORAL_CAPSULE | Freq: Two times a day (BID) | ORAL | 4 refills | Status: DC

## 2020-07-11 NOTE — Telephone Encounter (Signed)
Copied from [place] [id number]. Topic: General - Call Back - No Documentation >> Jul 11, 2020 12:27 PM Erick Blinks wrote: Call back request wanting more information for gababpentin 300 MG and fluconazole 150 Mg tablets refill request denial   [place], [place]  Reference number [id number]

## 2020-07-12 ENCOUNTER — Other Ambulatory Visit: Payer: Self-pay | Admitting: Family Medicine

## 2020-07-12 DIAGNOSIS — F321 Major depressive disorder, single episode, moderate: Secondary | ICD-10-CM

## 2020-07-17 ENCOUNTER — Inpatient Hospital Stay: Payer: Medicare Other

## 2020-07-18 ENCOUNTER — Inpatient Hospital Stay: Payer: Medicare Other

## 2020-07-18 ENCOUNTER — Inpatient Hospital Stay: Payer: Medicare Other | Admitting: Oncology

## 2020-07-22 ENCOUNTER — Encounter: Payer: Self-pay | Admitting: Family Medicine

## 2020-07-22 ENCOUNTER — Other Ambulatory Visit: Payer: Self-pay | Admitting: Family Medicine

## 2020-07-22 ENCOUNTER — Other Ambulatory Visit: Payer: Self-pay | Admitting: Urology

## 2020-07-22 DIAGNOSIS — E039 Hypothyroidism, unspecified: Secondary | ICD-10-CM

## 2020-07-22 DIAGNOSIS — N390 Urinary tract infection, site not specified: Secondary | ICD-10-CM

## 2020-07-24 ENCOUNTER — Telehealth: Payer: Self-pay | Admitting: Family Medicine

## 2020-07-24 MED ORDER — GABAPENTIN 300 MG PO CAPS: 600.0000 mg | ORAL_CAPSULE | Freq: Two times a day (BID) | ORAL | 4 refills | Status: DC

## 2020-07-24 MED ORDER — DESVENLAFAXINE ER 50 MG PO TB24: 1.0000 | ORAL_TABLET | Freq: Every morning | ORAL | 3 refills | Status: DC

## 2020-07-24 NOTE — Addendum Note (Signed)
Addended by: Ashley Royalty E on: 07/24/2020 01:40 PM   Modules accepted: Orders

## 2020-07-24 NOTE — Telephone Encounter (Signed)
Pharmacy called in stating they have not received the medication refill ,and was needing it sent over.

## 2020-07-24 NOTE — Telephone Encounter (Signed)
I spoke with Ms. [address] [place], she does not need fluconazole filled she needs desvenlafaxine 50mg  filled.     Please send to [facility].   Thanks,   -Mickel Baas

## 2020-07-24 NOTE — Telephone Encounter (Signed)
Medication not on current list Review for script  

## 2020-07-24 NOTE — Telephone Encounter (Signed)
Medication Refill - Medication: fluconazole (DIFLUCAN) 150 MG tablet   Has the patient contacted their pharmacy? No. Pharmacy called on behalf of the pt  Preferred Pharmacy (with phone number or street name):  [place], [place] Phone:  [phone]  Fax:  [phone]      Agent: Please be advised that RX refills may take up to 3 business days. We ask that you follow-up with your pharmacy.

## 2020-07-24 NOTE — Addendum Note (Signed)
Addended by: [name] on: 07/24/2020 03:56 PM   Modules accepted: Orders

## 2020-07-26 ENCOUNTER — Other Ambulatory Visit: Payer: Self-pay | Admitting: *Deleted

## 2020-07-29 IMAGING — CR LEFT KNEE - COMPLETE 4+ VIEW
4 series · 4 of 4 positions shown · non-contrast
Comparison: None.

CLINICAL DATA: Fall this morning.  Pain.

EXAM:
LEFT KNEE - COMPLETE 4+ VIEW

[knee ap]
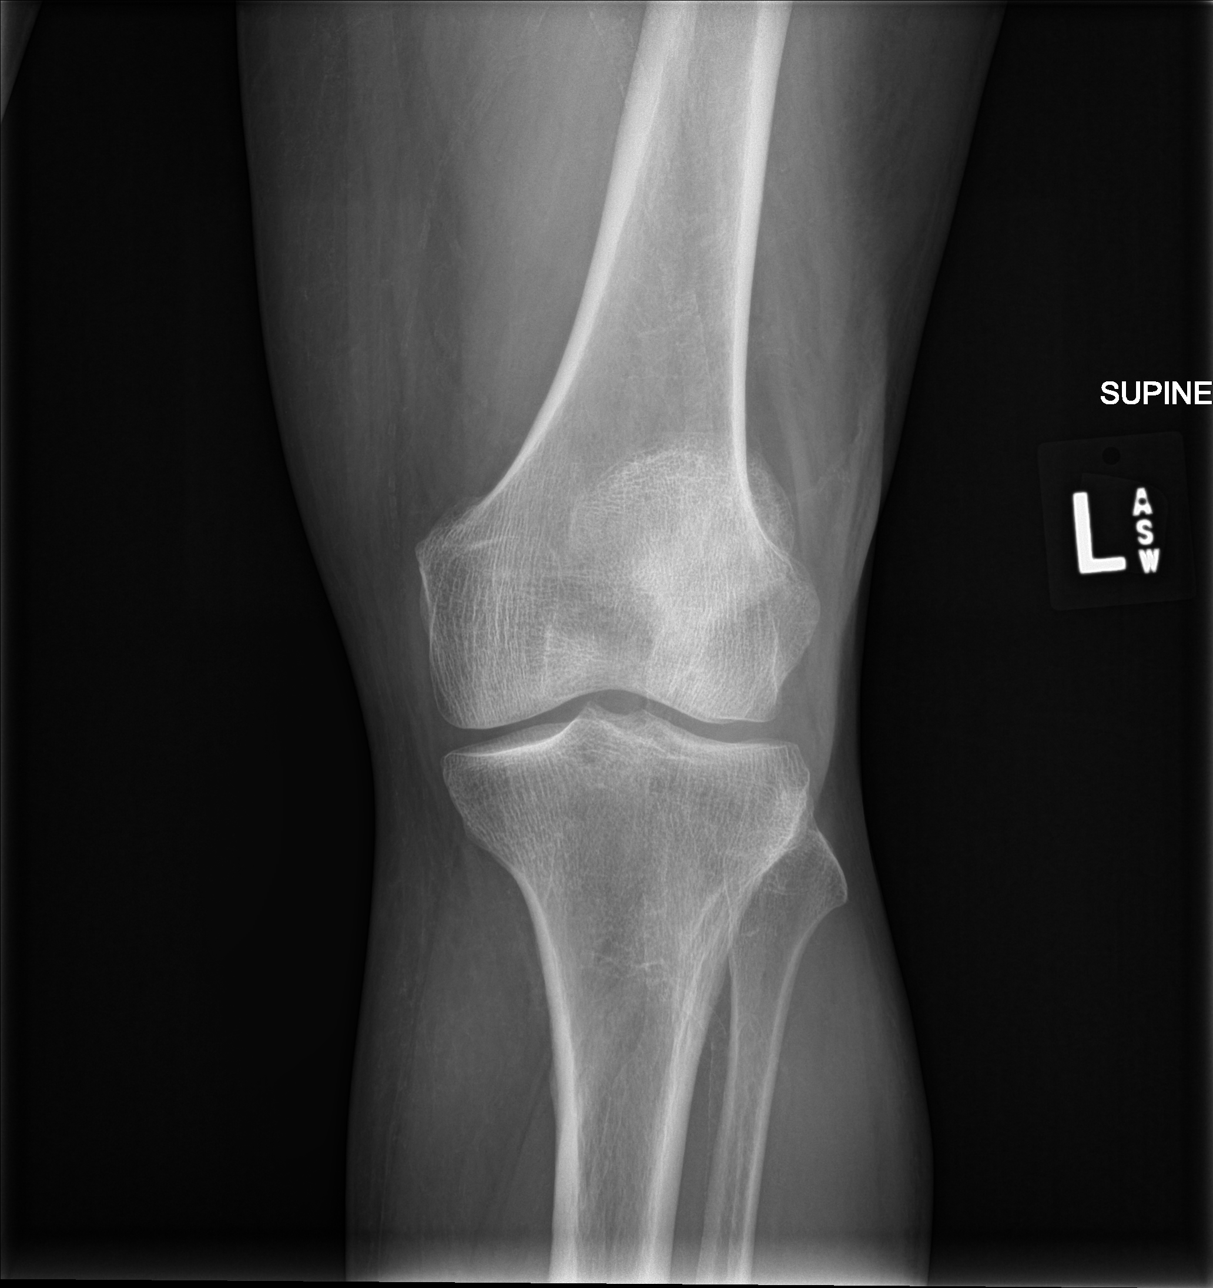

[knee lat]
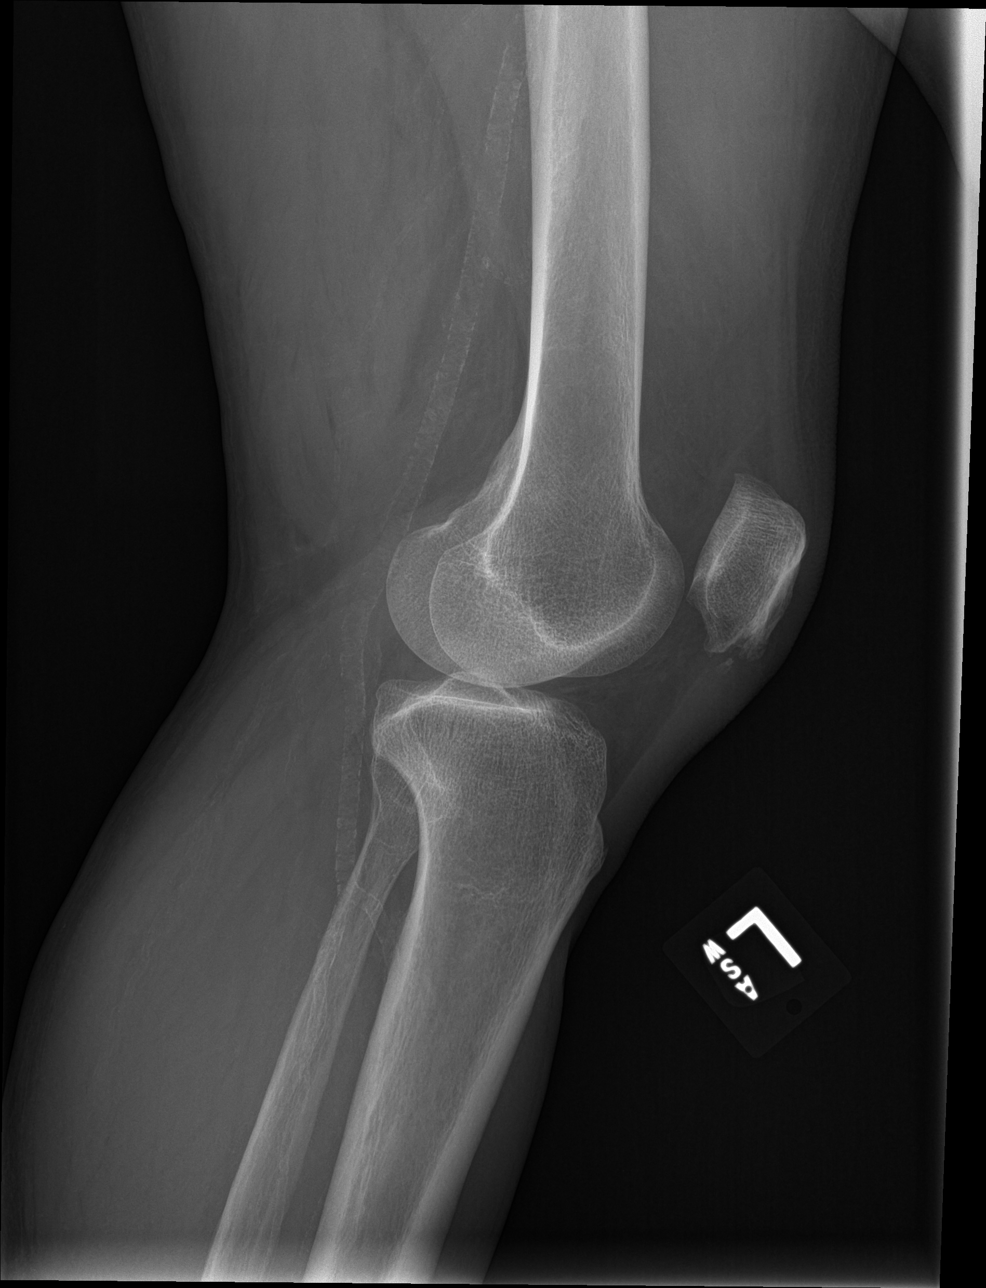

[knee obl (1 of 2)]
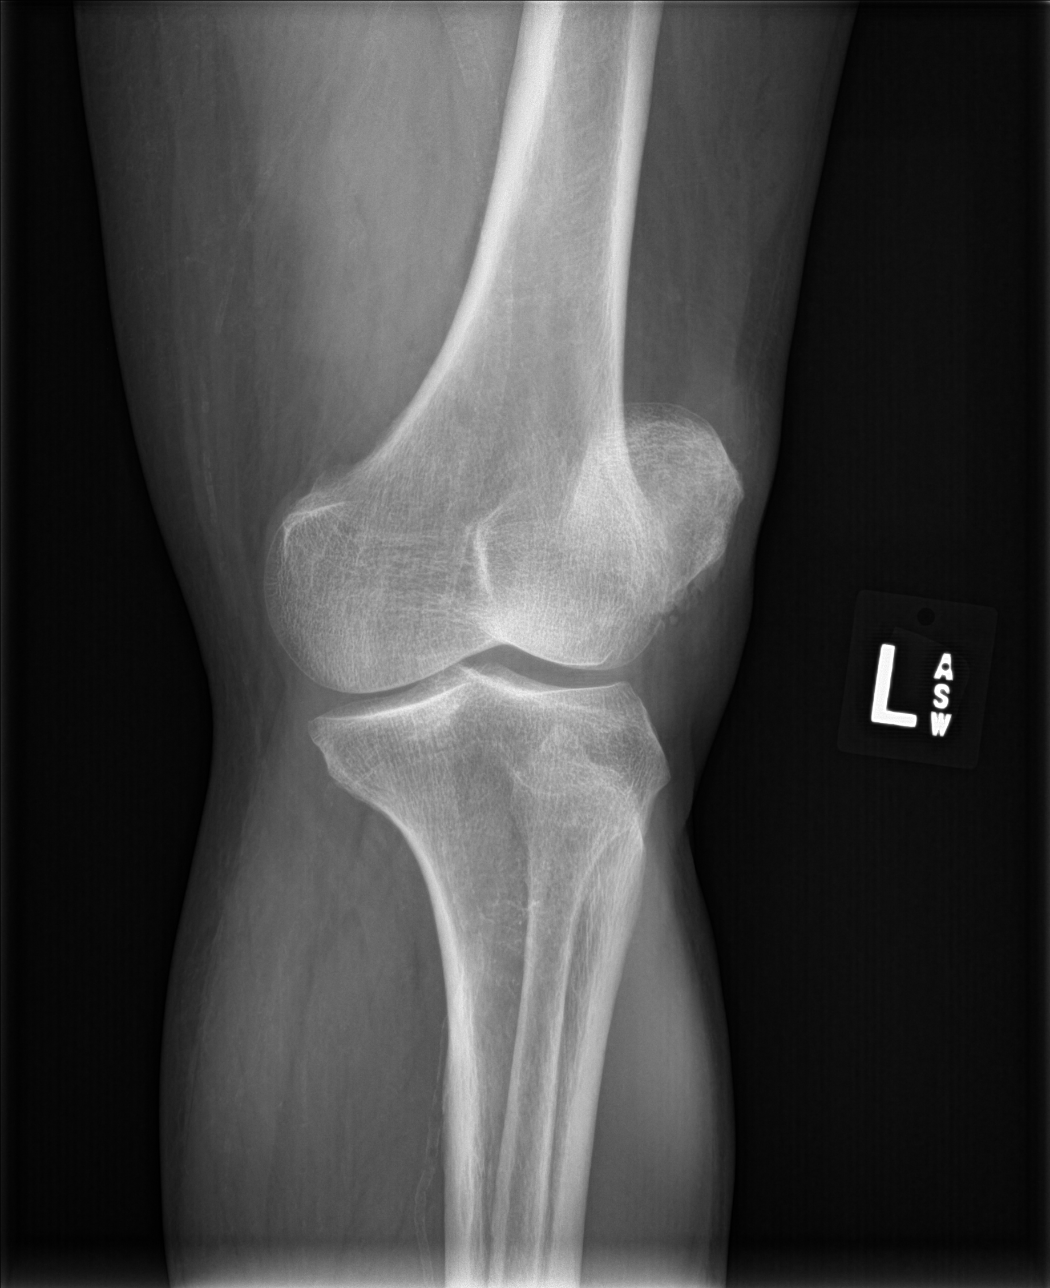

[knee obl (2 of 2)]
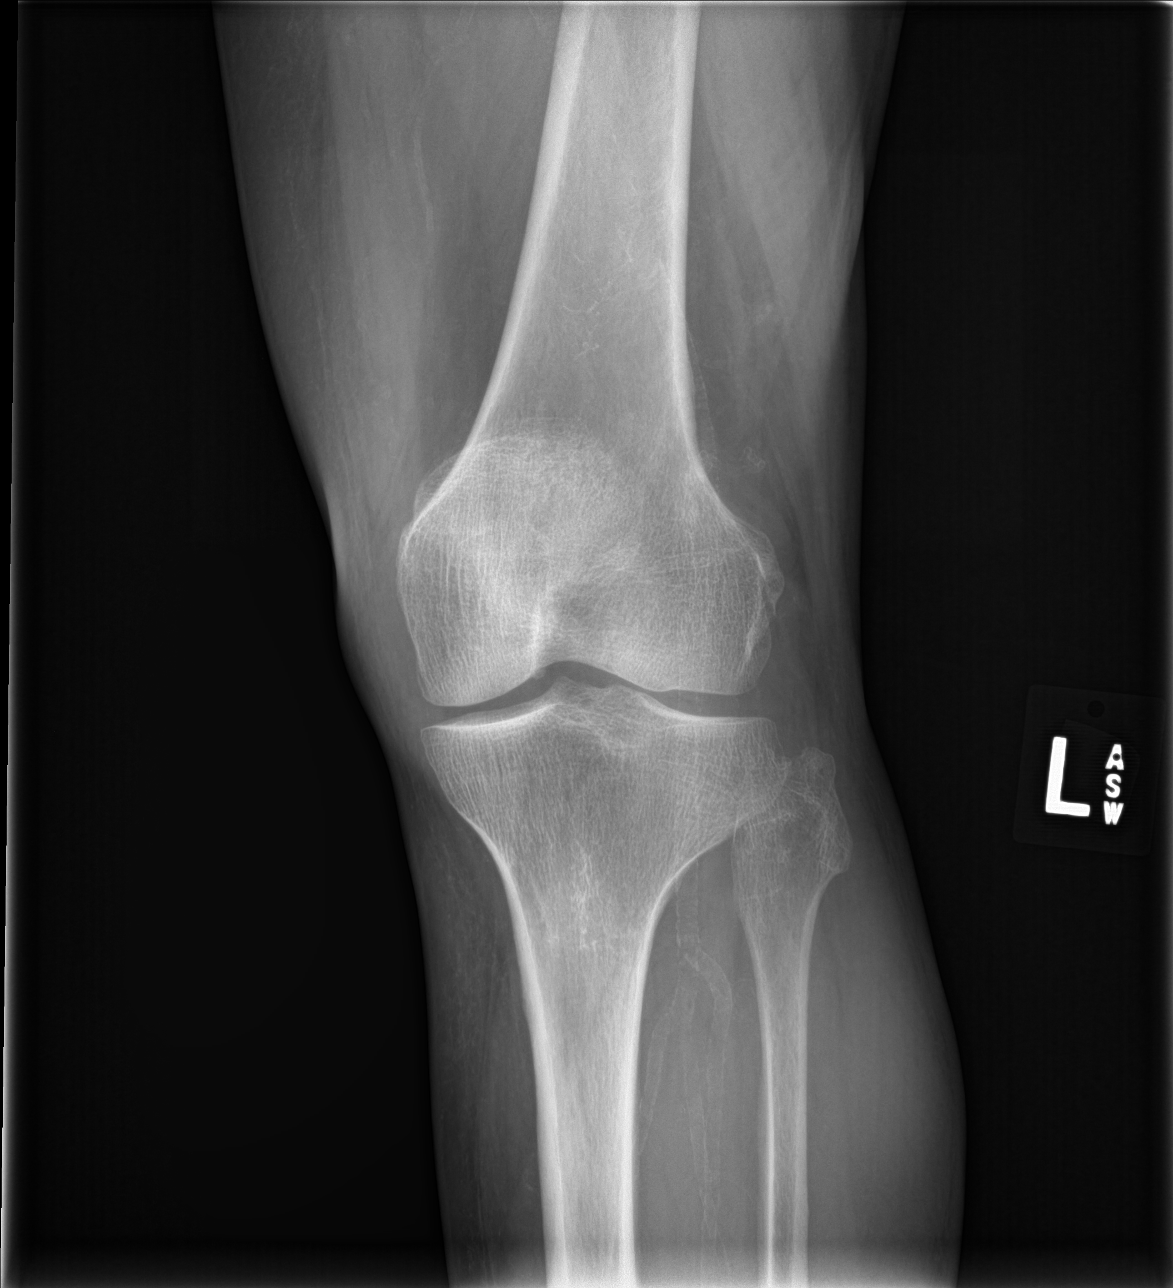

[4 of 4 positions shown; findings below may reference images not displayed]

FINDINGS: Vascular calcifications are identified no acute fracture or
effusion. No other acute abnormalities.
IMPRESSION: Negative.

## 2020-08-07 ENCOUNTER — Observation Stay
Admission: EM | Admit: 2020-08-07 | Discharge: 2020-08-09 | Disposition: A | Discharge status: Home / Self Care | Payer: Medicare Other | Attending: Internal Medicine | Admitting: Internal Medicine

## 2020-08-07 ENCOUNTER — Inpatient Hospital Stay: Payer: Medicare Other

## 2020-08-07 ENCOUNTER — Observation Stay: Payer: Medicare Other

## 2020-08-07 ENCOUNTER — Other Ambulatory Visit: Payer: Self-pay

## 2020-08-07 ENCOUNTER — Emergency Department: Payer: Medicare Other

## 2020-08-07 DIAGNOSIS — D631 Anemia in chronic kidney disease: Secondary | ICD-10-CM | POA: Insufficient documentation

## 2020-08-07 DIAGNOSIS — Z8544 Personal history of malignant neoplasm of other female genital organs: Secondary | ICD-10-CM | POA: Insufficient documentation

## 2020-08-07 DIAGNOSIS — G43409 Hemiplegic migraine, not intractable, without status migrainosus: Principal | ICD-10-CM | POA: Insufficient documentation

## 2020-08-07 DIAGNOSIS — N1832 Chronic kidney disease, stage 3b: Secondary | ICD-10-CM | POA: Diagnosis not present

## 2020-08-07 DIAGNOSIS — E1122 Type 2 diabetes mellitus with diabetic chronic kidney disease: Secondary | ICD-10-CM | POA: Insufficient documentation

## 2020-08-07 DIAGNOSIS — Z85828 Personal history of other malignant neoplasm of skin: Secondary | ICD-10-CM | POA: Diagnosis not present

## 2020-08-07 DIAGNOSIS — F419 Anxiety disorder, unspecified: Secondary | ICD-10-CM

## 2020-08-07 DIAGNOSIS — R519 Headache, unspecified: Secondary | ICD-10-CM

## 2020-08-07 DIAGNOSIS — Z87891 Personal history of nicotine dependence: Secondary | ICD-10-CM | POA: Insufficient documentation

## 2020-08-07 DIAGNOSIS — I639 Cerebral infarction, unspecified: Secondary | ICD-10-CM | POA: Diagnosis present

## 2020-08-07 DIAGNOSIS — Z955 Presence of coronary angioplasty implant and graft: Secondary | ICD-10-CM | POA: Diagnosis not present

## 2020-08-07 DIAGNOSIS — Z20822 Contact with and (suspected) exposure to covid-19: Secondary | ICD-10-CM | POA: Diagnosis not present

## 2020-08-07 DIAGNOSIS — R4701 Aphasia: Secondary | ICD-10-CM | POA: Diagnosis not present

## 2020-08-07 DIAGNOSIS — I4891 Unspecified atrial fibrillation: Secondary | ICD-10-CM | POA: Diagnosis not present

## 2020-08-07 DIAGNOSIS — R471 Dysarthria and anarthria: Secondary | ICD-10-CM | POA: Insufficient documentation

## 2020-08-07 DIAGNOSIS — I2510 Atherosclerotic heart disease of native coronary artery without angina pectoris: Secondary | ICD-10-CM | POA: Diagnosis not present

## 2020-08-07 DIAGNOSIS — R29898 Other symptoms and signs involving the musculoskeletal system: Secondary | ICD-10-CM | POA: Insufficient documentation

## 2020-08-07 DIAGNOSIS — Z79899 Other long term (current) drug therapy: Secondary | ICD-10-CM | POA: Diagnosis not present

## 2020-08-07 DIAGNOSIS — Z7902 Long term (current) use of antithrombotics/antiplatelets: Secondary | ICD-10-CM | POA: Diagnosis not present

## 2020-08-07 DIAGNOSIS — E039 Hypothyroidism, unspecified: Secondary | ICD-10-CM | POA: Diagnosis not present

## 2020-08-07 DIAGNOSIS — R200 Anesthesia of skin: Secondary | ICD-10-CM

## 2020-08-07 DIAGNOSIS — I1310 Hypertensive heart and chronic kidney disease without heart failure, with stage 1 through stage 4 chronic kidney disease, or unspecified chronic kidney disease: Secondary | ICD-10-CM | POA: Insufficient documentation

## 2020-08-07 LAB — CBC
HCT: 34.2 % — ABNORMAL LOW (ref 36.0–46.0)
Hemoglobin: 10.5 g/dL — ABNORMAL LOW (ref 12.0–15.0)
MCH: 25.1 pg — ABNORMAL LOW (ref 26.0–34.0)
MCHC: 30.7 g/dL (ref 30.0–36.0)
MCV: 81.8 fL (ref 80.0–100.0)
Platelets: 197 10*3/uL (ref 150–400)
RBC: 4.18 MIL/uL (ref 3.87–5.11)
RDW: 13.7 % (ref 11.5–15.5)
WBC: 6.1 10*3/uL (ref 4.0–10.5)
nRBC: 0 % (ref 0.0–0.2)

## 2020-08-07 LAB — URINALYSIS, COMPLETE (UACMP) WITH MICROSCOPIC
Bilirubin Urine: NEGATIVE
Glucose, UA: NEGATIVE mg/dL
Ketones, ur: NEGATIVE mg/dL
Nitrite: POSITIVE — AB
Protein, ur: 30 mg/dL — AB
Specific Gravity, Urine: 1.006 (ref 1.005–1.030)
Squamous Epithelial / HPF: >50 — ABNORMAL HIGH (ref 0–5)
WBC, UA: >50 WBC/hpf — ABNORMAL HIGH (ref 0–5)
pH: 5 (ref 5.0–8.0)

## 2020-08-07 LAB — BASIC METABOLIC PANEL
Anion gap: 9 (ref 5–15)
BUN: 17 mg/dL (ref 6–20)
CO2: 24 mmol/L (ref 22–32)
Calcium: 8.8 mg/dL — ABNORMAL LOW (ref 8.9–10.3)
Chloride: 103 mmol/L (ref 98–111)
Creatinine, Ser: 1.76 mg/dL — ABNORMAL HIGH (ref 0.44–1.00)
GFR, Estimated: 35 mL/min — ABNORMAL LOW (ref >60)
Glucose, Bld: 90 mg/dL (ref 70–99)
Potassium: 4.8 mmol/L (ref 3.5–5.1)
Sodium: 136 mmol/L (ref 135–145)

## 2020-08-07 LAB — TROPONIN I (HIGH SENSITIVITY)
Troponin I (High Sensitivity): 17 ng/L (ref <18)
Troponin I (High Sensitivity): 15 ng/L (ref <18)

## 2020-08-07 LAB — SEDIMENTATION RATE: Sed Rate: 28 mm/hr (ref 0–30)

## 2020-08-07 LAB — RESP PANEL BY RT-PCR (FLU A&B, COVID) ARPGX2
Influenza A by PCR: NEGATIVE
Influenza B by PCR: NEGATIVE
SARS Coronavirus 2 by RT PCR: NEGATIVE

## 2020-08-07 MED ORDER — LEVOTHYROXINE SODIUM 25 MCG PO TABS
25.0000 ug | ORAL_TABLET | Freq: Every day | ORAL | Status: DC
  Administered 2020-08-08 – 2020-08-09 (×2): 25 ug via ORAL
  Filled 2020-08-07 (×2): qty 1

## 2020-08-07 MED ORDER — NITROFURANTOIN MONOHYD MACRO 100 MG PO CAPS
100.0000 mg | ORAL_CAPSULE | Freq: Every day | ORAL | Status: DC
  Administered 2020-08-08: 100 mg via ORAL
  Filled 2020-08-07 (×2): qty 1

## 2020-08-07 MED ORDER — ALOSETRON HCL 1 MG PO TABS: 1.0000 mg | ORAL_TABLET | Freq: Two times a day (BID) | ORAL | Status: DC

## 2020-08-07 MED ORDER — ISOSORBIDE MONONITRATE ER 30 MG PO TB24
120.0000 mg | ORAL_TABLET | Freq: Every day | ORAL | Status: DC
  Administered 2020-08-08 – 2020-08-09 (×2): 120 mg via ORAL
  Filled 2020-08-07: qty 4
  Filled 2020-08-07: qty 2

## 2020-08-07 MED ORDER — SENNOSIDES-DOCUSATE SODIUM 8.6-50 MG PO TABS: 1.0000 | ORAL_TABLET | Freq: Every evening | ORAL | Status: DC | PRN

## 2020-08-07 MED ORDER — GABAPENTIN 300 MG PO CAPS
600.0000 mg | ORAL_CAPSULE | Freq: Two times a day (BID) | ORAL | Status: DC
  Administered 2020-08-08 – 2020-08-09 (×4): 600 mg via ORAL
  Filled 2020-08-07 (×4): qty 2

## 2020-08-07 MED ORDER — METOCLOPRAMIDE HCL 5 MG/ML IJ SOLN: 10.0000 mg | Freq: Once | INTRAMUSCULAR | Status: DC

## 2020-08-07 MED ORDER — CLOPIDOGREL BISULFATE 75 MG PO TABS
75.0000 mg | ORAL_TABLET | Freq: Every day | ORAL | Status: DC
  Administered 2020-08-08 – 2020-08-09 (×2): 75 mg via ORAL
  Filled 2020-08-07 (×2): qty 1

## 2020-08-07 MED ORDER — ENOXAPARIN SODIUM 40 MG/0.4ML IJ SOSY: 40.0000 mg | PREFILLED_SYRINGE | INTRAMUSCULAR | Status: DC

## 2020-08-07 MED ORDER — MEMANTINE HCL 5 MG PO TABS
5.0000 mg | ORAL_TABLET | Freq: Two times a day (BID) | ORAL | Status: DC
  Administered 2020-08-08 – 2020-08-09 (×4): 5 mg via ORAL
  Filled 2020-08-07 (×4): qty 1

## 2020-08-07 MED ORDER — TRAZODONE HCL 100 MG PO TABS
100.0000 mg | ORAL_TABLET | Freq: Every day | ORAL | Status: DC
  Administered 2020-08-08: 100 mg via ORAL
  Administered 2020-08-08: 21:00:00 200 mg via ORAL
  Filled 2020-08-07 (×3): qty 2

## 2020-08-07 MED ORDER — VENLAFAXINE HCL ER 75 MG PO CP24
75.0000 mg | ORAL_CAPSULE | Freq: Every day | ORAL | Status: DC
  Filled 2020-08-07: qty 1

## 2020-08-07 MED ORDER — DESVENLAFAXINE SUCCINATE ER 50 MG PO TB24
50.0000 mg | ORAL_TABLET | Freq: Every morning | ORAL | Status: DC
  Administered 2020-08-08 – 2020-08-09 (×2): 50 mg via ORAL
  Filled 2020-08-07 (×2): qty 1

## 2020-08-07 MED ORDER — BUPROPION HCL ER (SR) 100 MG PO TB12
100.0000 mg | ORAL_TABLET | Freq: Two times a day (BID) | ORAL | Status: DC
  Administered 2020-08-08 – 2020-08-09 (×4): 100 mg via ORAL
  Filled 2020-08-07 (×5): qty 1

## 2020-08-07 MED ORDER — POLYVINYL ALCOHOL 1.4 % OP SOLN
Freq: Every day | OPHTHALMIC | Status: DC | PRN
  Filled 2020-08-07: qty 15

## 2020-08-07 MED ORDER — ACETAMINOPHEN 160 MG/5ML PO SOLN
650.0000 mg | ORAL | Status: DC | PRN
  Filled 2020-08-07: qty 20.3

## 2020-08-07 MED ORDER — LORAZEPAM 0.5 MG PO TABS: 0.5000 mg | ORAL_TABLET | ORAL | Status: DC | PRN

## 2020-08-07 MED ORDER — ENOXAPARIN SODIUM 40 MG/0.4ML IJ SOSY
40.0000 mg | PREFILLED_SYRINGE | INTRAMUSCULAR | Status: DC
  Administered 2020-08-08 (×2): 40 mg via SUBCUTANEOUS
  Filled 2020-08-07 (×2): qty 0.4

## 2020-08-07 MED ORDER — DICLOFENAC SODIUM 1 % EX GEL: 2.0000 g | Freq: Four times a day (QID) | CUTANEOUS | Status: DC

## 2020-08-07 MED ORDER — CARVEDILOL 6.25 MG PO TABS
12.5000 mg | ORAL_TABLET | Freq: Two times a day (BID) | ORAL | Status: DC
  Administered 2020-08-08 – 2020-08-09 (×4): 12.5 mg via ORAL
  Filled 2020-08-07 (×4): qty 2

## 2020-08-07 MED ORDER — PHENAZOPYRIDINE HCL 100 MG PO TABS
95.0000 mg | ORAL_TABLET | Freq: Every day | ORAL | Status: DC | PRN
  Filled 2020-08-07: qty 1

## 2020-08-07 MED ORDER — BUTALBITAL-APAP-CAFFEINE 50-325-40 MG PO TABS
1.0000 | ORAL_TABLET | Freq: Four times a day (QID) | ORAL | Status: DC | PRN
  Administered 2020-08-08: 1 via ORAL
  Filled 2020-08-07: qty 1

## 2020-08-07 MED ORDER — DIPHENHYDRAMINE HCL 50 MG/ML IJ SOLN: 25.0000 mg | Freq: Once | INTRAMUSCULAR | Status: DC

## 2020-08-07 MED ORDER — PROMETHAZINE HCL 25 MG PO TABS
25.0000 mg | ORAL_TABLET | Freq: Four times a day (QID) | ORAL | Status: DC | PRN
  Filled 2020-08-07: qty 1

## 2020-08-07 MED ORDER — FAMOTIDINE 20 MG PO TABS
20.0000 mg | ORAL_TABLET | Freq: Two times a day (BID) | ORAL | Status: DC
  Administered 2020-08-08 (×2): 20 mg via ORAL
  Filled 2020-08-07 (×2): qty 1

## 2020-08-07 MED ORDER — SODIUM CHLORIDE 0.9 % IV SOLN
1.0000 g | Freq: Once | INTRAVENOUS | Status: AC
  Administered 2020-08-07: 1 g via INTRAVENOUS
  Filled 2020-08-07: qty 10

## 2020-08-07 MED ORDER — ALPRAZOLAM 1 MG PO TABS
1.0000 mg | ORAL_TABLET | Freq: Two times a day (BID) | ORAL | Status: DC
  Administered 2020-08-08 – 2020-08-09 (×4): 1 mg via ORAL
  Filled 2020-08-07: qty 2
  Filled 2020-08-07 (×2): qty 1
  Filled 2020-08-07: qty 2

## 2020-08-07 MED ORDER — ACETAMINOPHEN 325 MG PO TABS: 650.0000 mg | ORAL_TABLET | ORAL | Status: DC | PRN

## 2020-08-07 MED ORDER — STROKE: EARLY STAGES OF RECOVERY BOOK: Freq: Once | Status: DC

## 2020-08-07 MED ORDER — ROSUVASTATIN CALCIUM 20 MG PO TABS
40.0000 mg | ORAL_TABLET | Freq: Every day | ORAL | Status: DC
  Administered 2020-08-08 – 2020-08-09 (×3): 40 mg via ORAL
  Filled 2020-08-07 (×5): qty 2

## 2020-08-07 MED ORDER — ACETAMINOPHEN 650 MG RE SUPP: 650.0000 mg | RECTAL | Status: DC | PRN

## 2020-08-07 NOTE — ED Notes (Signed)
Pt eating dinner tray °

## 2020-08-07 NOTE — ED Notes (Signed)
Iv started labs sent.   pt up to bathroom with assistance.

## 2020-08-07 NOTE — ED Notes (Signed)
Patient ambulatory to bathroom independently.  

## 2020-08-07 NOTE — H&P (Signed)
History and Physical    [name] YCX:[id number] DOB: 06-16-1970 DOA: 08/07/2020  PCP: [name], MD (Confirm with patient/family/NH records and if not entered, this has to be entered at [facility] point of entry) Patient coming from: Home  I have personally briefly reviewed patient's old medical records in [place]  Chief Complaint: Speech problem, leg weakening  HPI: [name] is a 50 y.o. female with medical history significant of recurrent CVA on Plavix, PAF not on anticoagulation secondary to severe GI bleed in the past, HTN, migraines, anemia, CAD status post stenting, HLD, CKD stage III, anxiety/depression, recurrent UTI on suppression antibiotics, came with speech problem and right leg weakness.  Started to have appears to be migraine flareup 3 days ago with right-sided "pounding like headache" for which she took Fioricet with little help.  No vision changes. Same day, family noticed her speech became slurred. She felt her talking was slow and tongue sluggish.  She thought the symptoms might be related to her headache and did not pay attention, and symptoms improved next day.  Today, she developed right-sided lower extremity weakness and numbness.  Last month, she fell on her right sided and broke her right wrist.  She has been taking narcotics found to few weeks ago, but currently she does not take any narcotics.  She has had multiple UTIs this year and went to see her urologist last month was started on suppressive Macrobid.  She denied any history of back pain or lumbar spine problems.  ED Course: CT head negative.  Physical exam showed decreased muscle strength on right lower extremities.  Electrolytes largely normal, creatinine about baseline.  Review of Systems: As per HPI otherwise 14 point review of systems negative.    Past Medical History:  Diagnosis Date   Acute colitis 01/27/2017   Acute pyelonephritis    Anemia    iron deficiency anemia    Aortic arch aneurysm ([place])    BRCA negative 2014   CAD (coronary artery disease)    a. 08/2003 Cath: LAD 30-40-med Rx; b. 11/2014 PCI: LAD 6m(3.25x23 Xience Alpine DES); c. 06/2015 PCI: D1 (2.25x12 Resolute Integrity DES); d. 06/2017 PCI: Patent mLAD stent, D2 95 (PTCA); e. 09/2017 PCI: D2 99ost (CBA); d. 12/2017 Cath: LM nl, LAD 34m80d (small), D1 40ost, D2 95ost, LCX 40p, RCA 40ost/p->Med rx for D2 given restenosis.   Colitis 06/03/2015   Colon polyp    CVA (cerebral vascular accident) ([facility]   Left side weakness.    Degenerative tear of glenoid labrum of right shoulder 03/15/2017   Diabetes mellitus without complication ([place]   Family history of breast cancer    BRCA neg 2014   Femur fracture, left ([facility]9/10/2018   Gastric ulcer 04/27/2011   History of echocardiogram    a. 03/2017 Echo: EF 60-65%, no rwma; b. 02/2018 Echo: EF 60-65%, no rwma. Nl RV fxn. No cardiac source of emboli (admitted w/ stroke).   Hypertension    Malignant melanoma of skin of scalp (HCC)    MI, acute, non ST segment elevation (HCC)    Neuromuscular disorder (HCC)    S/P drug eluting coronary stent placement 06/04/2015   Sepsis ([facility]/14/2019   [place] ([place]   a. 02/2018 MRI: 45m66mate acute/early subacute L medial frontal lobe inarct; b. 02/2018 MRA No large vessel occlusion or aneurysm. Mod to sev L P2 stenosis. thready L vertebral artery, diffusely dzs'd; c. 02/2018 Carotid U/S: <  50% bilat ICA dzs.    Past Surgical History:  Procedure Laterality Date   APPENDECTOMY     BALLOON ENTEROSCOPY  02/06/2020   [facility]   BIOPSY N/A 03/14/2020   Procedure: BIOPSY;  Surgeon: [name], MD;  Location: [place];  Service: Endoscopy;  Laterality: N/A;   CARDIAC CATHETERIZATION N/A 11/09/2014   Procedure: Coronary Angiography;  Surgeon: Minna Merritts, MD;  Location: [place] CV LAB;  Service: Cardiovascular;  Laterality: N/A;   CARDIAC CATHETERIZATION N/A 11/12/2014   Procedure: Coronary Stent  Intervention;  Surgeon: Isaias Cowman, MD;  Location: [place] CV LAB;  Service: Cardiovascular;  Laterality: N/A;   CARDIAC CATHETERIZATION N/A 04/18/2015   Procedure: Left Heart Cath and Coronary Angiography;  Surgeon: Minna Merritts, MD;  Location: [place] CV [facility];  Service: Cardiovascular;  Laterality: N/A;   CARDIAC CATHETERIZATION Left 06/04/2015   Procedure: Left Heart Cath and Coronary Angiography;  Surgeon: Wellington Hampshire, MD;  Location: [place] [facility] LAB;  Service: Cardiovascular;  Laterality: Left;   CARDIAC CATHETERIZATION N/A 06/04/2015   Procedure: Coronary Stent Intervention;  Surgeon: Wellington Hampshire, MD;  Location: [place] [facility];  Service: Cardiovascular;  Laterality: N/A;   CESAREAN SECTION  2001   CHOLECYSTECTOMY N/A 11/18/2016   Procedure: LAPAROSCOPIC CHOLECYSTECTOMY WITH INTRAOPERATIVE CHOLANGIOGRAM;  Surgeon: Christene Lye, MD;  Location: [facility];  Service: General;  Laterality: N/A;   COLONOSCOPY WITH PROPOFOL N/A 04/27/2016   Procedure: COLONOSCOPY WITH PROPOFOL;  Surgeon: [name], MD;  Location: [place];  Service: Endoscopy;  Laterality: N/A;   COLONOSCOPY WITH PROPOFOL N/A 01/12/2018   Procedure: COLONOSCOPY WITH PROPOFOL;  Surgeon: Toledo, Benay Pike, MD;  Location: [facility];  Service: Endoscopy;  Laterality: N/A;   COLONOSCOPY WITH PROPOFOL N/A 03/14/2020   Procedure: COLONOSCOPY WITH PROPOFOL;  Surgeon: [name], MD;  Location: [place];  Service: Endoscopy;  Laterality: N/A;  Needs to be scheduled after 7:30 due to driver issues   CORONARY ANGIOPLASTY     CORONARY BALLOON ANGIOPLASTY N/A 06/29/2017   Procedure: CORONARY BALLOON ANGIOPLASTY;  Surgeon: Wellington Hampshire, MD;  Location: [place] [facility];  Service: Cardiovascular;  Laterality: N/A;   CORONARY BALLOON ANGIOPLASTY N/A 09/20/2017   Procedure: CORONARY BALLOON ANGIOPLASTY;  Surgeon: Wellington Hampshire, MD;  Location: [place] CV  LAB;  Service: Cardiovascular;  Laterality: N/A;   CORONARY STENT INTERVENTION N/A 12/13/2019   Procedure: CORONARY STENT INTERVENTION;  Surgeon: Wellington Hampshire, MD;  Location: [place] CV LAB;  Service: Cardiovascular;  Laterality: N/A;   DILATION AND CURETTAGE OF UTERUS     ESOPHAGOGASTRODUODENOSCOPY (EGD) WITH PROPOFOL N/A 09/14/2014   Procedure: ESOPHAGOGASTRODUODENOSCOPY (EGD) WITH PROPOFOL;  Surgeon: Josefine Class, MD;  Location: [facility];  Service: Endoscopy;  Laterality: N/A;   ESOPHAGOGASTRODUODENOSCOPY (EGD) WITH PROPOFOL N/A 04/27/2016   Procedure: ESOPHAGOGASTRODUODENOSCOPY (EGD) WITH PROPOFOL;  Surgeon: [name], MD;  Location: [place];  Service: Endoscopy;  Laterality: N/A;  Diabetic - oral meds   ESOPHAGOGASTRODUODENOSCOPY (EGD) WITH PROPOFOL N/A 01/12/2018   Procedure: ESOPHAGOGASTRODUODENOSCOPY (EGD) WITH PROPOFOL;  Surgeon: Toledo, Benay Pike, MD;  Location: [facility];  Service: Endoscopy;  Laterality: N/A;   ESOPHAGOGASTRODUODENOSCOPY (EGD) WITH PROPOFOL N/A 04/11/2019   Procedure: ESOPHAGOGASTRODUODENOSCOPY (EGD) WITH PROPOFOL;  Surgeon: [name], MD;  Location: [facility];  Service: Endoscopy;  Laterality: N/A;   ESOPHAGOGASTRODUODENOSCOPY (EGD) WITH PROPOFOL N/A 03/08/2020   Procedure: ESOPHAGOGASTRODUODENOSCOPY (EGD) WITH PROPOFOL;  Surgeon: [name], MD;  Location:  [place] ENDOSCOPY;  Service: Endoscopy;  Laterality: N/A;   GASTRIC BYPASS  09/2009   [facility]    INTRAMEDULLARY (IM) NAIL INTERTROCHANTERIC Left 10/13/2018   Procedure: INTRAMEDULLARY (IM) NAIL INTERTROCHANTRIC;  Surgeon: Leandrew Koyanagi, MD;  Location: [place];  Service: Orthopedics;  Laterality: Left;   Left Carotid to sublcavian artery bypass w/ subclavian artery ligation     a. Performed @ [facility].   LEFT HEART CATH AND CORONARY ANGIOGRAPHY Left 06/29/2017   Procedure: LEFT HEART CATH AND CORONARY ANGIOGRAPHY;  Surgeon: Wellington Hampshire, MD;  Location:  [place] CV LAB;  Service: Cardiovascular;  Laterality: Left;   LEFT HEART CATH AND CORONARY ANGIOGRAPHY N/A 09/20/2017   Procedure: LEFT HEART CATH AND CORONARY ANGIOGRAPHY;  Surgeon: Wellington Hampshire, MD;  Location: [place] [facility];  Service: Cardiovascular;  Laterality: N/A;   LEFT HEART CATH AND CORONARY ANGIOGRAPHY N/A 12/20/2017   Procedure: LEFT HEART CATH AND CORONARY ANGIOGRAPHY;  Surgeon: Wellington Hampshire, MD;  Location: [place] [facility];  Service: Cardiovascular;  Laterality: N/A;   LEFT HEART CATH AND CORONARY ANGIOGRAPHY N/A 04/20/2019   Procedure: LEFT HEART CATH AND CORONARY ANGIOGRAPHY possible PCI;  Surgeon: Minna Merritts, MD;  Location: [name] [facility];  Service: Cardiovascular;  Laterality: N/A;   LEFT HEART CATH AND CORONARY ANGIOGRAPHY N/A 12/13/2019   Procedure: LEFT HEART CATH AND CORONARY ANGIOGRAPHY;  Surgeon: Wellington Hampshire, MD;  Location: [place] [facility];  Service: Cardiovascular;  Laterality: N/A;   MELANOMA EXCISION  2016   Dr. Henreitta Cea ABLATION  2002   RIGHT OOPHORECTOMY     SHOULDER ARTHROSCOPY WITH OPEN ROTATOR CUFF REPAIR Right 01/07/2016   Procedure: SHOULDER ARTHROSCOPY WITH DEBRIDMENT, SUBACHROMIAL DECOMPRESSION;  Surgeon: Corky Mull, MD;  Location: [facility];  Service: Orthopedics;  Laterality: Right;   SHOULDER ARTHROSCOPY WITH OPEN ROTATOR CUFF REPAIR Right 03/16/2017   Procedure: SHOULDER ARTHROSCOPY WITH OPEN ROTATOR CUFF REPAIR POSSIBLE BICEPS TENODESIS;  Surgeon: Corky Mull, MD;  Location: [facility];  Service: Orthopedics;  Laterality: Right;   TRIGGER FINGER RELEASE Right     [place]     reports that she quit smoking about 25 years ago. Her smoking use included cigarettes. She has a 0.25 pack-year smoking history. She has never used smokeless tobacco. She reports that she does not drink alcohol and does not use drugs.  Allergies  Allergen Reactions   Lipitor [Atorvastatin] Other (See Comments)    Leg  pains    Family History  Problem Relation Age of Onset   Hypertension Mother    Anxiety disorder Mother    Depression Mother    Bipolar disorder Mother    Heart disease Mother        No details   Hyperlipidemia Mother    Kidney disease Father    Heart disease Father 58   Hypertension Father    Diabetes Father    Stroke Father    Colon cancer Father        dx in his 72's   Anxiety disorder Father    Depression Father    Skin cancer Father    Kidney disease Sister    Thyroid nodules Sister    Hypertension Sister    Hypertension Sister    Diabetes Sister    Hyperlipidemia Sister    Depression Sister    Breast cancer Maternal Aunt 62   Breast cancer Maternal Aunt 83   Ovarian cancer Cousin  Colon cancer Cousin    Kidney cancer Cousin    Breast cancer Other    Bladder Cancer Neg Hx      Prior to Admission medications   Medication Sig Start Date End Date Taking? Authorizing Provider  alosetron (LOTRONEX) 1 MG tablet Take 1 tablet (1 mg total) by mouth 2 (two) times daily. 03/18/20   [name], MD  ALPRAZolam Duanne Moron) 1 MG tablet TAKE 1 TABLET BY MOUTH TWICE DAILY Patient taking differently: Take 1 mg by mouth 2 (two) times daily. 06/30/20   [name], MD  BAQSIMI ONE PACK 3 MG/DOSE POWD Place 1 spray into both nostrils as directed. 02/11/20   [provider]  buPROPion (WELLBUTRIN SR) 100 MG 12 hr tablet Take 100 mg by mouth 2 (two) times daily. 05/03/20   [provider]  butalbital-acetaminophen-caffeine (FIORICET) 50-325-40 MG tablet Take 1 tablet by mouth every 6 (six) hours as needed for headache. 06/19/20   [name], MD  carvedilol (COREG) 12.5 MG tablet Take 1 tablet (12.5 mg total) by mouth 2 (two) times daily. 05/02/20 07/31/20  Deboraha Sprang, MD  clopidogrel (PLAVIX) 75 MG tablet Take 75 mg by mouth daily.    [provider]  desvenlafaxine (PRISTIQ) 50 MG 24 hr tablet Take 1 tablet (50 mg total) by mouth daily. 05/03/20    [name], MD  Desvenlafaxine ER (PRISTIQ) 50 MG TB24 Take 1 tablet (50 mg total) by mouth every morning. 07/24/20   [name], MD  diclofenac Sodium (VOLTAREN) 1 % GEL SMARTSIG:2 Gram(s) Topical 3 Times Daily 06/25/20   [provider]  diphenoxylate-atropine (LOMOTIL) 2.5-0.025 MG tablet Take 1 tablet by mouth 4 (four) times daily as needed for diarrhea or loose stools. 07/03/20   [name], MD  estradiol (ESTRACE) 0.1 MG/GM vaginal cream Estrogen Cream Instruction Discard applicator Apply pea sized amount to tip of finger to urethra before bed. Wash hands well after application. Use Monday, Wednesday and Friday 05/08/20   Billey Co, MD  famotidine (PEPCID) 20 MG tablet Take 20 mg by mouth 2 (two) times daily.    [provider]  gabapentin (NEURONTIN) 300 MG capsule Take 2 capsules (600 mg total) by mouth 2 (two) times daily. 07/24/20   [name], MD  glucose 4 GM chewable tablet Chew 1 tablet (4 g total) by mouth 3 (three) times daily. 04/25/20   Fritzi Mandes, MD  isosorbide mononitrate (IMDUR) 120 MG 24 hr tablet Take 1 tablet (120 mg total) by mouth daily. 07/02/20   Minna Merritts, MD  levothyroxine (SYNTHROID) 25 MCG tablet Take 1 tablet by mouth once daily before breakfast Patient taking differently: Take 25 mcg by mouth daily before breakfast. 07/22/20   [name], MD  memantine (NAMENDA) 5 MG tablet Take 5 mg by mouth 2 (two) times daily. 05/03/20   [provider]  nitrofurantoin, macrocrystal-monohydrate, (MACROBID) 100 MG capsule Take 1 capsule (100 mg total) by mouth daily. For 3 months. 07/05/20   Billey Co, MD  nitroGLYCERIN (NITROSTAT) 0.4 MG SL tablet Place 1 tablet (0.4 mg total) under the tongue every 5 (five) minutes as needed for chest pain. 10/20/19   Dwyane Dee, MD  Phenazopyridine HCl (AZO TABS PO) Take 2 tablets by mouth daily.    [provider]  Polyethyl Glycol-Propyl Glycol (SYSTANE OP) Place 1 drop  into both eyes daily as needed (dry eyes).    [provider]  promethazine (PHENERGAN)  25 MG tablet Take 1 tablet (25 mg total) by mouth every 6 (six) hours as needed for nausea or vomiting. 07/02/20   [name], MD  rosuvastatin (CRESTOR) 40 MG tablet Take 1 tablet (40 mg total) by mouth daily at 6 PM. 10/25/19   Gollan, Kathlene November, MD  traMADol (ULTRAM) 50 MG tablet Take 50 mg by mouth every 6 (six) hours as needed. 06/24/20   [provider]  traZODone (DESYREL) 100 MG tablet Take 1-2 tablets (100-200 mg total) by mouth at bedtime. 05/31/20   [name], MD  Ubrogepant (UBRELVY) 100 MG TABS Take by mouth. 05/03/20   [provider]    Physical Exam: Vitals:   08/07/20 1530 08/07/20 1545 08/07/20 1600 08/07/20 1615  BP:   (!) 169/91   Pulse: 61 62 64 65  Resp:  '18 13 14  ' Temp:      TempSrc:      SpO2: 100% 100% 100% 100%  Weight:      Height:        Constitutional: NAD, calm, comfortable Vitals:   08/07/20 1530 08/07/20 1545 08/07/20 1600 08/07/20 1615  BP:   (!) 169/91   Pulse: 61 62 64 65  Resp:  '18 13 14  ' Temp:      TempSrc:      SpO2: 100% 100% 100% 100%  Weight:      Height:       Eyes: PERRL, lids and conjunctivae normal ENMT: Mucous membranes are moist. Posterior pharynx clear of any exudate or lesions.Normal dentition.  Neck: normal, supple, no masses, no thyromegaly Respiratory: clear to auscultation bilaterally, no wheezing, no crackles. Normal respiratory effort. No accessory muscle use.  Cardiovascular: Regular rate and rhythm, no murmurs / rubs / gallops. No extremity edema. 2+ pedal pulses. No carotid bruits.  Abdomen: no tenderness, no masses palpated. No hepatosplenomegaly. Bowel sounds positive.  Musculoskeletal: no clubbing / cyanosis. No joint deformity upper and lower extremities. Good ROM, no contractures. Normal muscle tone.  Skin: no rashes, lesions, ulcers. No induration Neurologic: CN 2-12 grossly intact. Sensation  intact, DTR normal. Strength 4/5 on right lower extremity below the knee compared to 5/5 on the left.  Decreased light touch sensation below the knee on the right side compared to the left.   Psychiatric: Normal judgment and insight. Alert and oriented x 3. Normal mood.     Labs on Admission: I have personally reviewed following labs and imaging studies  CBC: Recent Labs  Lab 08/07/20 1213  WBC 6.1  HGB 10.5*  HCT 34.2*  MCV 81.8  PLT 588   Basic Metabolic Panel: Recent Labs  Lab 08/07/20 1213  NA 136  K 4.8  CL 103  CO2 24  GLUCOSE 90  BUN 17  CREATININE 1.76*  CALCIUM 8.8*   GFR: Estimated Creatinine Clearance: 34.5 mL/min (A) (by C-G formula based on SCr of 1.76 mg/dL (H)). Liver Function Tests: No results for input(s): AST, ALT, ALKPHOS, BILITOT, PROT, ALBUMIN in the last 168 hours. No results for input(s): LIPASE, AMYLASE in the last 168 hours. No results for input(s): AMMONIA in the last 168 hours. Coagulation Profile: No results for input(s): INR, PROTIME in the last 168 hours. Cardiac Enzymes: No results for input(s): CKTOTAL, CKMB, CKMBINDEX, TROPONINI in the last 168 hours. BNP (last 3 results) No results for input(s): PROBNP in the last 8760 hours. HbA1C: No results for input(s): HGBA1C in the last 72 hours. CBG: No results for input(s): GLUCAP in  the last 168 hours. Lipid Profile: No results for input(s): CHOL, HDL, LDLCALC, TRIG, CHOLHDL, LDLDIRECT in the last 72 hours. Thyroid Function Tests: No results for input(s): TSH, T4TOTAL, FREET4, T3FREE, THYROIDAB in the last 72 hours. Anemia Panel: No results for input(s): VITAMINB12, FOLATE, FERRITIN, TIBC, IRON, RETICCTPCT in the last 72 hours. Urine analysis:    Component Value Date/Time   COLORURINE YELLOW (A) 08/07/2020 1620   APPEARANCEUR TURBID (A) 08/07/2020 1620   APPEARANCEUR Hazy (A) 07/04/2020 0952   LABSPEC 1.006 08/07/2020 1620   LABSPEC 1.012 12/29/2013 1210   PHURINE 5.0 08/07/2020  1620   GLUCOSEU NEGATIVE 08/07/2020 1620   GLUCOSEU >=500 12/29/2013 1210   HGBUR SMALL (A) 08/07/2020 1620   HGBUR negative 04/14/2010 1435   BILIRUBINUR NEGATIVE 08/07/2020 1620   BILIRUBINUR Negative 07/04/2020 0952   BILIRUBINUR Negative 12/29/2013 1210   KETONESUR NEGATIVE 08/07/2020 1620   PROTEINUR 30 (A) 08/07/2020 1620   UROBILINOGEN 0.2 04/18/2020 1326   UROBILINOGEN negative 04/14/2010 1435   NITRITE POSITIVE (A) 08/07/2020 1620   LEUKOCYTESUR LARGE (A) 08/07/2020 1620   LEUKOCYTESUR Negative 12/29/2013 1210    Radiological Exams on Admission: CT Head Wo Contrast  Result Date: 08/07/2020 CLINICAL DATA:  Neuro deficit, acute stroke suspected. EXAM: CT HEAD WITHOUT CONTRAST TECHNIQUE: Contiguous axial images were obtained from the base of the skull through the vertex without intravenous contrast. COMPARISON:  CT head 06/17/2020 FINDINGS: Brain: Patchy and confluent areas of decreased attenuation are noted throughout the deep and periventricular white matter of the cerebral hemispheres bilaterally, compatible with chronic microvascular ischemic disease. Similar-appearing right basilar lacunar infarction. No evidence of large-territorial acute infarction. No parenchymal hemorrhage. No mass lesion. No extra-axial collection. No mass effect or midline shift. No hydrocephalus. Basilar cisterns are patent. Vascular: No hyperdense vessel. Atherosclerotic calcifications are present within the cavernous internal carotid and vertebral arteries. Skull: No acute fracture or focal lesion. Sinuses/Orbits: Paranasal sinuses and mastoid air cells are clear. Bilateral lens replacement. Otherwise the orbits are unremarkable. Other: None. IMPRESSION: No acute intracranial abnormality. Electronically Signed   By: Iven Finn M.D.   On: 08/07/2020 15:23    EKG: Independently reviewed. Sinus, no acute ST changes  Assessment/Plan Active Problems:   Stroke [facility])   CVA (cerebral vascular accident)  ([place])  (please populate well all problems here in Problem List. (For example, if patient is on BP meds at home and you resume or decide to hold them, it is a problem that needs to be her. Same for CAD, COPD, HLD and so on)  CVA vs complex migraines -MRI to rule out stroke. -Continue Plavix and statin -Trial of cocktail of Reglan and Benadryl, continue Fioricet. -PT evaluation  Headache -Denies any vision changes. But appears to have bilateral photophobia 2 months ago as per PCP record, will order 1 ESR for baseline. -Trial of Reglan and Benadryl -Continue gabapentin and Fioricet.  PAF -in sinus rhythm, not on anticoagulation due to severe GI bleed in the past.  HTN -Out of window of permissive hypertension, resume home BP meds.  Anxiety/depression -Continue SSRI - On Namenda  History of recurrent UTIs -Continue suppressive Macrobid.  CAD -Cath in 12/2019, stable.   Hypothyroidism -Continue Synthroid  DVT prophylaxis: Lovenox renal dosed Code Status: Full code Family Communication: None at bedside Disposition Plan: Expect less than 2 midnight hospital stay, once stroke ruled out patient can be discharged back home. Consults called: None Admission status: Tele obs   Lequita Halt MD [facility] Pager [phone]  08/07/2020, 5:59 PM

## 2020-08-07 NOTE — ED Provider Notes (Signed)
[facility] Emergency Department Provider Note  ____________________________________________   Event Date/Time   First MD Initiated Contact with Patient 08/07/20 1407     (approximate)  I have reviewed the triage vital signs and the nursing notes.   HISTORY  Chief Complaint Weakness   HPI [name] [name] is a 50 y.o. female with medical history significant for recurrent UTI, chronic kidney disease stage III, iron deficiency anemia, coronary artery disease, hypertension, history of CVA with some residual right leg weakness who presents for assessment of approximately 2 days of right-sided headache, difficulty getting her words out, difficulty with her memory and worsening weakness in her right leg and decree sensation in both lower legs.  Patient does not think she has had any recent falls or injuries over the last 2 days.  She denies any chest pain, cough, shortness of breath, vomiting, diarrhea, dysuria rash or other clear focal deficits.  He states that she has had some numbness with prior strokes in the past in her legs but this feels worse than typical.  Does not have any vertigo or vision changes.  She states her son is a Airline pilot and told her about 2 days ago 21st noticed patient having difficulty with her words and memory.  No other acute concerns at this time.  She states she is compliant with all her medicines.         Past Medical History:  Diagnosis Date   Acute colitis 01/27/2017   Acute pyelonephritis    Anemia    iron deficiency anemia   Aortic arch aneurysm ([place])    BRCA negative 2014   CAD (coronary artery disease)    a. 08/2003 Cath: LAD 30-40-med Rx; b. 11/2014 PCI: LAD 43m(3.25x23 Xience Alpine DES); c. 06/2015 PCI: D1 (2.25x12 Resolute Integrity DES); d. 06/2017 PCI: Patent mLAD stent, D2 95 (PTCA); e. 09/2017 PCI: D2 99ost (CBA); d. 12/2017 Cath: LM nl, LAD 311m80d (small), D1 40ost, D2 95ost, LCX 40p, RCA 40ost/p->Med rx for D2  given restenosis.   Colitis 06/03/2015   Colon polyp    CVA (cerebral vascular accident) ([place]   Left side weakness.    Degenerative tear of glenoid labrum of right shoulder 03/15/2017   Diabetes mellitus without complication (HCMc[place]   Family history of breast cancer    BRCA neg 2014   Femur fracture, left (HC[place]field9/10/2018   Gastric ulcer 04/27/2011   History of echocardiogram    a. 03/2017 Echo: EF 60-65%, no rwma; b. 02/2018 Echo: EF 60-65%, no rwma. Nl RV fxn. No cardiac source of emboli (admitted w/ stroke).   Hypertension    Malignant melanoma of skin of scalp (HCC)    MI, acute, non ST segment elevation (HCC)    Neuromuscular disorder (HCC)    S/P drug eluting coronary stent placement 06/04/2015   Sepsis (HCS[place]2/14/2019   Stroke ([place]   a. 02/2018 MRI: 17m76mate acute/early subacute L medial frontal lobe inarct; b. 02/2018 MRA No large vessel occlusion or aneurysm. Mod to sev L P2 stenosis. thready L vertebral artery, diffusely dzs'd; c. 02/2018 Carotid U/S: <50% bilat ICA dzs.    Patient Active Problem List   Diagnosis Date Noted   Sepsis secondary to UTI (HCCSouth Charleston3/22/2022   Hyperkalemia 04/23/2020   Unspecified atrial fibrillation (HCC[place]3/22/2022   Family history of malignant neoplasm of gastrointestinal tract    Iron deficiency anemia secondary to blood loss (chronic)    Acute on  chronic anemia 03/07/2020   AKI (acute kidney injury) ([place]) 03/07/2020   Upper GI bleed    Near syncope 01/25/2020   Diabetes mellitus ([place]) 01/25/2020   Dehydration 01/25/2020   Acute upper GI bleed 01/25/2020   Pain in limb 01/05/2020   Pulmonary embolism ([place]) 11/07/2019   Chest pain 11/05/2019   Type II diabetes mellitus with renal manifestations ([place]) 11/05/2019   Acute renal failure superimposed on stage 3a chronic kidney disease ([place]) 09/02/2019   Hypoglycemia    Family history of breast cancer 05/23/2019   Family history of colon cancer 05/23/2019   Retinopathy 05/12/2019   Abnormal LFTs  04/18/2019   Esophageal dysphagia    Gastrointestinal tract imaging abnormality    Trochanteric bursitis, left hip 03/17/2019   Malignant neoplasm of vulva, unspecified ([place]) 03/10/2019   Peripheral vascular disease, unspecified ([place]) 03/10/2019   Paroxysmal supraventricular tachycardia ([place]) 03/10/2019   Contusion of left hip 03/06/2019   Chest pain, moderate coronary artery risk 01/27/2019   Melena    Recurrent chest pain 01/19/2019   Depression, major, single episode, moderate ([place]) 12/09/2018   Closed nondisplaced intertrochanteric fracture of left femur ([place]) 10/12/2018   Hypotension 06/26/2018   Autonomic neuropathy 03/24/2018   Acute delirium 03/03/2018   H/O gastric bypass 03/02/2018   Hypertension associated with stage 3 chronic kidney disease due to type 2 diabetes mellitus ([place]) 02/20/2018   SI (sacroiliac) joint dysfunction 12/02/2017   Unstable angina ([place]) 06/24/2017   Syncope 04/09/2017   Insomnia 03/18/2017   Ischemic cardiomyopathy    Arthritis    Anxiety    Tendinitis of upper biceps tendon of right shoulder 03/16/2017   Gastroenteritis 01/27/2017   Hx of colonic polyps    H/O medication noncompliance 12/14/2015   CAD (coronary artery disease)    Hypertensive heart disease    Status post bariatric surgery 06/04/2015   Carotid stenosis 04/30/2015   Stable angina pectoris ([place]) 04/17/2015   Iron deficiency anemia 03/22/2015   Vitamin B12 deficiency 02/18/2015   Misuse of medications for pain 02/18/2015   Major depressive disorder, recurrent, severe with psychotic features ([place]) 83/66/2947   Helicobacter pylori infection 11/23/2014   Malignant melanoma ([place]) 08/25/2014   Incomplete bladder emptying 07/12/2014   Hypothyroidism 12/30/2013   Aberrant subclavian artery 11/17/2013   Multiple sclerosis ([place]) 11/02/2013   History of CVA with residual deficit 06/20/2013   Headache, migraine 05/29/2013   Hyperlipidemia    GERD (gastroesophageal reflux disease)     Neuropathy ([place]) 01/02/2011   Stroke ([place]) 06/21/2008   Depression with anxiety 05/01/2008   Essential hypertension 05/01/2008    Past Surgical History:  Procedure Laterality Date   APPENDECTOMY     BALLOON ENTEROSCOPY  02/06/2020   [facility]   BIOPSY N/A 03/14/2020   Procedure: BIOPSY;  Surgeon: [name], MD;  Location: [place];  Service: Endoscopy;  Laterality: N/A;   CARDIAC CATHETERIZATION N/A 11/09/2014   Procedure: Coronary Angiography;  Surgeon: Minna Merritts, MD;  Location: [place] [facility];  Service: Cardiovascular;  Laterality: N/A;   CARDIAC CATHETERIZATION N/A 11/12/2014   Procedure: Coronary Stent Intervention;  Surgeon: Isaias Cowman, MD;  Location: [place] [facility];  Service: Cardiovascular;  Laterality: N/A;   CARDIAC CATHETERIZATION N/A 04/18/2015   Procedure: Left Heart Cath and Coronary Angiography;  Surgeon: Minna Merritts, MD;  Location: [place] [facility];  Service: Cardiovascular;  Laterality: N/A;   CARDIAC CATHETERIZATION Left 06/04/2015   Procedure: Left Heart Cath and Coronary Angiography;  Surgeon: Wellington Hampshire, MD;  Location: [place] [facility];  Service: Cardiovascular;  Laterality: Left;   CARDIAC CATHETERIZATION N/A 06/04/2015   Procedure: Coronary Stent Intervention;  Surgeon: Wellington Hampshire, MD;  Location: [place] [facility];  Service: Cardiovascular;  Laterality: N/A;   CESAREAN SECTION  2001   CHOLECYSTECTOMY N/A 11/18/2016   Procedure: LAPAROSCOPIC CHOLECYSTECTOMY WITH INTRAOPERATIVE CHOLANGIOGRAM;  Surgeon: Christene Lye, MD;  Location: [facility];  Service: General;  Laterality: N/A;   COLONOSCOPY WITH PROPOFOL N/A 04/27/2016   Procedure: COLONOSCOPY WITH PROPOFOL;  Surgeon: [name], MD;  Location: [place];  Service: Endoscopy;  Laterality: N/A;   COLONOSCOPY WITH PROPOFOL N/A 01/12/2018   Procedure: COLONOSCOPY WITH PROPOFOL;  Surgeon: Toledo, Benay Pike, MD;  Location: [facility];   Service: Endoscopy;  Laterality: N/A;   COLONOSCOPY WITH PROPOFOL N/A 03/14/2020   Procedure: COLONOSCOPY WITH PROPOFOL;  Surgeon: [name], MD;  Location: [place];  Service: Endoscopy;  Laterality: N/A;  Needs to be scheduled after 7:30 due to driver issues   CORONARY ANGIOPLASTY     CORONARY BALLOON ANGIOPLASTY N/A 06/29/2017   Procedure: CORONARY BALLOON ANGIOPLASTY;  Surgeon: Wellington Hampshire, MD;  Location: [place] [facility];  Service: Cardiovascular;  Laterality: N/A;   CORONARY BALLOON ANGIOPLASTY N/A 09/20/2017   Procedure: CORONARY BALLOON ANGIOPLASTY;  Surgeon: Wellington Hampshire, MD;  Location: [place] [facility];  Service: Cardiovascular;  Laterality: N/A;   CORONARY STENT INTERVENTION N/A 12/13/2019   Procedure: CORONARY STENT INTERVENTION;  Surgeon: Wellington Hampshire, MD;  Location: [place] [facility];  Service: Cardiovascular;  Laterality: N/A;   DILATION AND CURETTAGE OF UTERUS     ESOPHAGOGASTRODUODENOSCOPY (EGD) WITH PROPOFOL N/A 09/14/2014   Procedure: ESOPHAGOGASTRODUODENOSCOPY (EGD) WITH PROPOFOL;  Surgeon: Josefine Class, MD;  [facility];  Service: Endoscopy;  Laterality: N/A;   ESOPHAGOGASTRODUODENOSCOPY (EGD) WITH PROPOFOL N/A 04/27/2016   Procedure: ESOPHAGOGASTRODUODENOSCOPY (EGD) WITH PROPOFOL;  Surgeon: [name], MD;  Location: [place];  Service: Endoscopy;  Laterality: N/A;  Diabetic - oral meds   ESOPHAGOGASTRODUODENOSCOPY (EGD) WITH PROPOFOL N/A 01/12/2018   Procedure: ESOPHAGOGASTRODUODENOSCOPY (EGD) WITH PROPOFOL;  Surgeon: Tol[place], Benay Pike, MD;  Location: [facility];  Service: Endoscopy;  Laterality: N/A;   ESOPHAGOGASTRODUODENOSCOPY (EGD) WITH PROPOFOL N/A 04/11/2019   Procedure: ESOPHAGOGASTRODUODENOSCOPY (EGD) WITH PROPOFOL;  Surgeon: [name], MD;  Location: [facility];  Service: Endoscopy;  Laterality: N/A;   ESOPHAGOGASTRODUODENOSCOPY (EGD) WITH PROPOFOL N/A 03/08/2020   Procedure:  ESOPHAGOGASTRODUODENOSCOPY (EGD) WITH PROPOFOL;  Surgeon: [name], MD;  [facility] END[facility];  Service: Endoscopy;  Laterality: N/A;   GASTRIC BYPASS  09/2009   [facility]    INTRAMEDULLARY (IM) NAIL INTERTROCHANTERIC Left 10/13/2018   Procedure: INTRAMEDULLARY (IM) NAIL INTERTROCHANTRIC;  Surgeon: Leandrew Koyanagi, MD;  Location: [place];  Service: Orthopedics;  Laterality: Left;   Left Carotid to sublcavian artery bypass w/ subclavian artery ligation     a. Performed @ [facility].   LEFT HEART CATH AND CORONARY ANGIOGRAPHY Left 06/29/2017   Procedure: LEFT HEART CATH AND CORONARY ANGIOGRAPHY;  Surgeon: Wellington Hampshire, MD;  Location: [place] [facility];  Service: Cardiovascular;  Laterality: Left;   LEFT HEART CATH AND CORONARY ANGIOGRAPHY N/A 09/20/2017   Procedure: LEFT HEART CATH AND CORONARY ANGIOGRAPHY;  Surgeon: Wellington Hampshire, MD;  Location: [place] [facility];  Service: Cardiovascular;  Laterality: N/A;   LEFT HEART CATH AND CORONARY ANGIOGRAPHY N/A 12/20/2017   Procedure: LEFT  HEART CATH AND CORONARY ANGIOGRAPHY;  Surgeon: Wellington Hampshire, MD;  Location: [place] [facility];  Service: Cardiovascular;  Laterality: N/A;   LEFT HEART CATH AND CORONARY ANGIOGRAPHY N/A 04/20/2019   Procedure: LEFT HEART CATH AND CORONARY ANGIOGRAPHY possible PCI;  Surgeon: Minna Merritts, MD;  Location: [place] [facility];  Service: Cardiovascular;  Laterality: N/A;   LEFT HEART CATH AND CORONARY ANGIOGRAPHY N/A 12/13/2019   Procedure: LEFT HEART CATH AND CORONARY ANGIOGRAPHY;  Surgeon: Wellington Hampshire, MD;  Location: [place] [facility];  Service: Cardiovascular;  Laterality: N/A;   MELANOMA EXCISION  2016   Dr. Henreitta Cea ABLATION  2002   RIGHT OOPHORECTOMY     SHOULDER ARTHROSCOPY WITH OPEN ROTATOR CUFF REPAIR Right 01/07/2016   Procedure: SHOULDER ARTHROSCOPY WITH DEBRIDMENT, SUBACHROMIAL DECOMPRESSION;  Surgeon: Corky Mull, MD;  Location: [facility];  Service:  Orthopedics;  Laterality: Right;   SHOULDER ARTHROSCOPY WITH OPEN ROTATOR CUFF REPAIR Right 03/16/2017   Procedure: SHOULDER ARTHROSCOPY WITH OPEN ROTATOR CUFF REPAIR POSSIBLE BICEPS TENODESIS;  Surgeon: Corky Mull, MD;  Location: [facility];  Service: Orthopedics;  Laterality: Right;   TRIGGER FINGER RELEASE Right     [place]    Prior to Admission medications   Medication Sig Start Date End Date Taking? Authorizing Provider  alosetron (LOTRONEX) 1 MG tablet Take 1 tablet (1 mg total) by mouth 2 (two) times daily. 03/18/20   [name], MD  ALPRAZolam Duanne Moron) 1 MG tablet TAKE 1 TABLET BY MOUTH TWICE DAILY 06/30/20   [name], MD  BAQSIMI ONE PACK 3 MG/DOSE POWD Place 1 spray into both nostrils as directed. 02/11/20   [provider]  buPROPion (WELLBUTRIN SR) 100 MG 12 hr tablet SMARTSIG:1 Tablet(s) By Mouth Every 12 Hours 05/03/20   [provider]  butalbital-acetaminophen-caffeine (FIORICET) 50-325-40 MG tablet Take 1 tablet by mouth every 6 (six) hours as needed for headache. 06/19/20   [name], MD  carvedilol (COREG) 12.5 MG tablet Take 1 tablet (12.5 mg total) by mouth 2 (two) times daily. 05/02/20 07/31/20  Deboraha Sprang, MD  clopidogrel (PLAVIX) 75 MG tablet Take 75 mg by mouth daily.    [provider]  desvenlafaxine (PRISTIQ) 50 MG 24 hr tablet Take 1 tablet (50 mg total) by mouth daily. 05/03/20   [name], MD  Desvenlafaxine ER (PRISTIQ) 50 MG TB24 Take 1 tablet (50 mg total) by mouth every morning. 07/24/20   [name], MD  diclofenac Sodium (VOLTAREN) 1 % GEL SMARTSIG:2 Gram(s) Topical 3 Times Daily 06/25/20   [provider]  diphenoxylate-atropine (LOMOTIL) 2.5-0.025 MG tablet Take 1 tablet by mouth 4 (four) times daily as needed for diarrhea or loose stools. 07/03/20   [name], MD  estradiol (ESTRACE) 0.1 MG/GM vaginal cream Estrogen Cream Instruction Discard applicator Apply pea sized amount to tip of finger to  urethra before bed. Wash hands well after application. Use Monday, Wednesday and Friday 05/08/20   Billey Co, MD  famotidine (PEPCID) 20 MG tablet Take 20 mg by mouth 2 (two) times daily.    [provider]  gabapentin (NEURONTIN) 300 MG capsule Take 2 capsules (600 mg total) by mouth 2 (two) times daily. 07/24/20   [name], MD  glucose 4 GM chewable tablet Chew 1 tablet (4 g total) by mouth 3 (three) times daily. 04/25/20   Fritzi Mandes, MD  isosorbide mononitrate (IMDUR) 120 MG 24 hr tablet  Take 1 tablet (120 mg total) by mouth daily. 07/02/20   Minna Merritts, MD  levothyroxine (SYNTHROID) 25 MCG tablet Take 1 tablet by mouth once daily before breakfast 07/22/20   [name], MD  memantine (NAMENDA) 5 MG tablet Take 5 mg by mouth 2 (two) times daily. 05/03/20   [provider]  nitrofurantoin, macrocrystal-monohydrate, (MACROBID) 100 MG capsule Take 1 capsule (100 mg total) by mouth daily. For 3 months. 07/05/20   Billey Co, MD  nitroGLYCERIN (NITROSTAT) 0.4 MG SL tablet Place 1 tablet (0.4 mg total) under the tongue every 5 (five) minutes as needed for chest pain. 10/20/19   Dwyane Dee, MD  Phenazopyridine HCl (AZO TABS PO) Take 2 tablets by mouth daily.    [provider]  Polyethyl Glycol-Propyl Glycol (SYSTANE OP) Place 1 drop into both eyes daily as needed (dry eyes).    [provider]  promethazine (PHENERGAN) 25 MG tablet Take 1 tablet (25 mg total) by mouth every 6 (six) hours as needed for nausea or vomiting. 07/02/20   [name], MD  rosuvastatin (CRESTOR) 40 MG tablet Take 1 tablet (40 mg total) by mouth daily at 6 PM. 10/25/19   Gollan, Kathlene November, MD  traMADol (ULTRAM) 50 MG tablet Take 50 mg by mouth every 6 (six) hours as needed. 06/24/20   [provider]  traZODone (DESYREL) 100 MG tablet Take 1-2 tablets (100-200 mg total) by mouth at bedtime. 05/31/20   [name], MD  Ubrogepant (UBRELVY) 100 MG TABS  Take by mouth. 05/03/20   [provider]    Allergies Lipitor [atorvastatin]  Family History  Problem Relation Age of Onset   Hypertension Mother    Anxiety disorder Mother    Depression Mother    Bipolar disorder Mother    Heart disease Mother        No details   Hyperlipidemia Mother    Kidney disease Father    Heart disease Father 67   Hypertension Father    Diabetes Father    Stroke Father    Colon cancer Father        dx in his 48's   Anxiety disorder Father    Depression Father    Skin cancer Father    Kidney disease Sister    Thyroid nodules Sister    Hypertension Sister    Hypertension Sister    Diabetes Sister    Hyperlipidemia Sister    Depression Sister    Breast cancer Maternal Aunt 55   Breast cancer Maternal Aunt 39   Ovarian cancer Cousin    Colon cancer Cousin    Kidney cancer Cousin    Breast cancer Other    Bladder Cancer Neg Hx     Social History Social History   Tobacco Use   Smoking status: Former    Packs/day: 0.25    Years: 1.00    Pack years: 0.25    Types: Cigarettes    Quit date: 08/31/1994    Years since quitting: 25.9   Smokeless tobacco: Never   Tobacco comments:    quit 28 years ago  Vaping Use   Vaping Use: Never used  Substance Use Topics   Alcohol use: No    Alcohol/week: 0.0 standard drinks   Drug use: No    Review of Systems  Review of Systems  Constitutional:  Negative for chills and fever.  HENT:  Negative for sore throat.   Eyes:  Negative for  pain.  Respiratory:  Negative for cough and stridor.   Cardiovascular:  Negative for chest pain.  Gastrointestinal:  Negative for vomiting.  Genitourinary:  Negative for dysuria.  Musculoskeletal:  Negative for myalgias.  Skin:  Negative for rash.  Neurological:  Positive for tremors, sensory change (numbness in both legs slightly above knees), focal weakness (R leg) and headaches. Negative for seizures and loss of consciousness.  Psychiatric/Behavioral:   Negative for suicidal ideas.   All other systems reviewed and are negative.    ____________________________________________   PHYSICAL EXAM:  VITAL SIGNS: ED Triage Vitals  Enc Vitals Group     BP 08/07/20 1213 100/79     Pulse Rate 08/07/20 1213 77     Resp 08/07/20 1213 18     Temp 08/07/20 1213 98.2 F (36.8 C)     Temp Source 08/07/20 1213 Oral     SpO2 08/07/20 1213 97 %     Weight 08/07/20 1216 145 lb (65.8 kg)     Height 08/07/20 1216 '5\' 3"'  (1.6 m)     Head Circumference --      Peak Flow --      Pain Score 08/07/20 1215 10     Pain Loc --      Pain Edu? --      Excl. in [place]? --    Vitals:   08/07/20 1213 08/07/20 1500  BP: 100/79 (!) 153/90  Pulse: 77 62  Resp: 18 17  Temp: 98.2 F (36.8 C)   SpO2: 97% 98%   Physical Exam Vitals and nursing note reviewed.  Constitutional:      General: She is not in acute distress.    Appearance: She is well-developed.  HENT:     Head: Normocephalic and atraumatic.     Right Ear: External ear normal.     Left Ear: External ear normal.     Nose: Nose normal.  Eyes:     Conjunctiva/sclera: Conjunctivae normal.  Cardiovascular:     Rate and Rhythm: Normal rate and regular rhythm.     Heart sounds: No murmur heard. Pulmonary:     Effort: Pulmonary effort is normal. No respiratory distress.     Breath sounds: Normal breath sounds.  Abdominal:     Palpations: Abdomen is soft.     Tenderness: There is no abdominal tenderness.  Musculoskeletal:     Cervical back: Neck supple.  Skin:    General: Skin is warm and dry.     Capillary Refill: Capillary refill takes less than 2 seconds.  Neurological:     Mental Status: She is alert and oriented to person, place, and time.  Psychiatric:        Mood and Affect: Mood normal.    Patient is oriented x4.  Fine bilateral upper extremity tremor.  No pronator drift.  No finger dysmetria.  Patient has symmetric strength in her bilateral upper extremities.  She is able to lift her  right heel off the bed for approximately 7 seconds in her left heel for 10 seconds.  She does not have sensation intact apparent to light touch from proximal to the knees distally but sensation is intact proximal to this in the bilateral lower extremities and proximal upper extremities.  PERRLA.  EOMI.  No midline tenderness over the back.  Abdomen is soft. ____________________________________________   LABS (all labs ordered are listed, but only abnormal results are displayed)  Labs Reviewed  BASIC METABOLIC PANEL - Abnormal; Notable for the following components:  Result Value   Creatinine, Ser 1.76 (*)    Calcium 8.8 (*)    GFR, Estimated 35 (*)    All other components within normal limits  CBC - Abnormal; Notable for the following components:   Hemoglobin 10.5 (*)    HCT 34.2 (*)    MCH 25.1 (*)    All other components within normal limits  RESP PANEL BY RT-PCR (FLU A&B, COVID) ARPGX2  URINALYSIS, COMPLETE (UACMP) WITH MICROSCOPIC  CBG MONITORING, ED  POC URINE PREG, ED  TROPONIN I (HIGH SENSITIVITY)  TROPONIN I (HIGH SENSITIVITY)   ____________________________________________  EKG  Sinus rhythm with a ventricular to 78, normal axis, unremarkable intervals, nonspecific ST change in V1 and V2 without any other clearance of acute ischemia or significant underlying arrhythmia. ____________________________________________  RADIOLOGY  ED MD interpretation: CT head shows no evidence of intracranial hemorrhage or subacute CVA or other clear acute intracranial process.  Official radiology report(s): CT Head Wo Contrast  Result Date: 08/07/2020 CLINICAL DATA:  Neuro deficit, acute stroke suspected. EXAM: CT HEAD WITHOUT CONTRAST TECHNIQUE: Contiguous axial images were obtained from the base of the skull through the vertex without intravenous contrast. COMPARISON:  CT head 06/17/2020 FINDINGS: Brain: Patchy and confluent areas of decreased attenuation are noted throughout the deep  and periventricular white matter of the cerebral hemispheres bilaterally, compatible with chronic microvascular ischemic disease. Similar-appearing right basilar lacunar infarction. No evidence of large-territorial acute infarction. No parenchymal hemorrhage. No mass lesion. No extra-axial collection. No mass effect or midline shift. No hydrocephalus. Basilar cisterns are patent. Vascular: No hyperdense vessel. Atherosclerotic calcifications are present within the cavernous internal carotid and vertebral arteries. Skull: No acute fracture or focal lesion. Sinuses/Orbits: Paranasal sinuses and mastoid air cells are clear. Bilateral lens replacement. Otherwise the orbits are unremarkable. Other: None. IMPRESSION: No acute intracranial abnormality. Electronically Signed   By: Iven Finn M.D.   On: 08/07/2020 15:23    ____________________________________________   PROCEDURES  Procedure(s) performed (including Critical Care):  .1-3 Lead EKG Interpretation  Date/Time: 08/07/2020 4:17 PM Performed by: Lucrezia Starch, MD Authorized by: Lucrezia Starch, MD     Interpretation: normal     ECG rate assessment: normal     Rhythm: sinus rhythm     Ectopy: none     Conduction: normal     ____________________________________________   INITIAL IMPRESSION / ASSESSMENT AND PLAN / ED COURSE      Patient presents with above-stated history exam for assessment of headache difficulty with word finding and memory problems as well as some increased weakness in her right leg and decreased sensation from the knees below.  On arrival she is afebrile and hemodynamically stable.  She does have decree sensation to light touch at this examiner throughout both lower legs and seems little bit weaker on the right leg on hip flexion.  No clear focal deficits in upper extremities and will patient states she has an aphasia though she is not slurring her speech and is oriented.  She does not recall any recent trauma  over the last 2 or 3 days although does note she recently sprained her wrist and has fallen several times over several months.  She does not describe any clear acute foci of infectious symptoms and Evalose patient for toxic ingestion.  No findings on history or exam of deep space infection in the head or neck.  She has no vision changes or findings to suggest acute angle-closure glaucoma.  Low suspicion for temporal arteritis  or venous sinus thrombosis at this time.  ECG has no significant arrhythmia or clear evidence of ischemia.  In addition troponin is nonelevated not suggestive of ACS.  CBC without leukocytosis and hemoglobin of 10.5 compared to eleven 1 month ago.  BMP shows no clear acute significant electrolyte or metabolic derangements his kidney function of 1.761 close to baseline which ranges between 1.35 and 1.7 over the last several months.  COVID influenza testing is negative.  Patient denies any urinary GI or abdominal symptoms we will obtain a UA as well to rule out a cystitis.  CT head shows no evidence of intracranial hemorrhage or subacute CVA or other clear acute intracranial process.  Overall history is concerning for possible CVA and so I think patient would likely require admission for MRI work-up.  Care patient signed over to oncoming rider approximately 1500.  Plan is to admit to medicine service for further evaluation and management as patient will likely additionally require some PT and OT as she said she felt too weak and unsteady for trial of ambulation in the ED.     ____________________________________________   FINAL CLINICAL IMPRESSION(S) / ED DIAGNOSES  Final diagnoses:  Aphasia  Weakness of both lower extremities  Numbness in both legs  Nonintractable headache, unspecified chronicity pattern, unspecified headache type    Medications - No data to display   ED Discharge Orders     None        Note:  This document was prepared using Dragon voice  recognition software and may include unintentional dictation errors.    Lucrezia Starch, MD 08/07/20 [phone]

## 2020-08-07 NOTE — ED Notes (Signed)
Resumed care from amber rn.  Pt alert.  nsr on monitor.

## 2020-08-07 NOTE — ED Notes (Signed)
Patient provided sandwich tray per request. 

## 2020-08-07 NOTE — ED Provider Notes (Addendum)
-----------------------------------------   3:05 PM on 08/07/2020 -----------------------------------------  Blood pressure 100/79, pulse 77, temperature 98.2 F (36.8 C), temperature source Oral, resp. rate 18, height 5\' 3"  (1.6 m), weight 65.8 kg, SpO2 97 %.  Assuming care from Dr. Tamala Julian.  In short, [name] is a 50 y.o. female with a chief complaint of Weakness .  Refer to the original H&P for additional details.  The current plan of care is to follow-up CT results, anticipate admission for stroke workup.  ----------------------------------------- 4:09 PM on 08/07/2020 ----------------------------------------- CT head is negative for acute process, patient continues to endorse lower extremity numbness and weakness but denies any symptoms concerning for cauda equina.  Her speech does appear to be improved on my evaluation.  Case discussed with hospitalist for admission for further stroke work-up.  ----------------------------------------- 5:31 PM on 08/07/2020 ----------------------------------------- UA is concerning for UTI, which could be contributing to patient's weakness.  We will add on urine culture and treat with IV Rocephin.    Blake Divine, MD 08/07/20 1610    Blake Divine, MD 08/07/20 [phone]

## 2020-08-07 NOTE — ED Notes (Signed)
Medication still hasn't been verified by pharmacy

## 2020-08-07 NOTE — ED Notes (Signed)
Report off to russ rn cpod nurse.  Pt moved to cpod

## 2020-08-07 NOTE — ED Notes (Signed)
Patient transported to CT 

## 2020-08-07 NOTE — ED Triage Notes (Signed)
Pt comes into the ED via [facility] from home c/o generalized weakness in the bilateral legs as well as a headache.  Pt has a h/o strokes in the past.  Pt did present being unsteady on her feet and she states this has progressed over the past couple days.  140/90, 73 HR, 98% RA.

## 2020-08-07 NOTE — ED Notes (Signed)
Pt back from MRI 

## 2020-08-07 NOTE — ED Notes (Signed)
Pt alert, eating dinner tray. 

## 2020-08-08 ENCOUNTER — Observation Stay: Payer: Medicare Other

## 2020-08-08 ENCOUNTER — Encounter: Payer: Self-pay | Admitting: Oncology

## 2020-08-08 ENCOUNTER — Observation Stay (HOSPITAL_BASED_OUTPATIENT_CLINIC_OR_DEPARTMENT_OTHER)
Admit: 2020-08-08 | Discharge: 2020-08-08 | Disposition: A | Discharge status: Home / Self Care | Payer: Medicare Other | Attending: Internal Medicine | Admitting: Internal Medicine

## 2020-08-08 DIAGNOSIS — G43009 Migraine without aura, not intractable, without status migrainosus: Secondary | ICD-10-CM | POA: Diagnosis not present

## 2020-08-08 DIAGNOSIS — G43809 Other migraine, not intractable, without status migrainosus: Secondary | ICD-10-CM | POA: Diagnosis not present

## 2020-08-08 DIAGNOSIS — I6389 Other cerebral infarction: Secondary | ICD-10-CM

## 2020-08-08 LAB — HEMOGLOBIN A1C
Hgb A1c MFr Bld: 5.3 % (ref 4.8–5.6)
Mean Plasma Glucose: 105.41 mg/dL

## 2020-08-08 LAB — LIPID PANEL
Cholesterol: 180 mg/dL (ref 0–200)
HDL: 35 mg/dL — ABNORMAL LOW (ref >40)
LDL Cholesterol: 121 mg/dL — ABNORMAL HIGH (ref 0–99)
Total CHOL/HDL Ratio: 5.1 RATIO
Triglycerides: 118 mg/dL (ref <150)
VLDL: 24 mg/dL (ref 0–40)

## 2020-08-08 LAB — ECHOCARDIOGRAM COMPLETE
AR max vel: 1.89 cm2
AV Area VTI: 2.57 cm2
AV Area mean vel: 2.1 cm2
AV Mean grad: 5 mmHg
AV Peak grad: 8.3 mmHg
Ao pk vel: 1.44 m/s
Area-P 1/2: 4.44 cm2
Height: 63 in
S' Lateral: 3.2 cm
Weight: 2320 oz

## 2020-08-08 MED ORDER — FAMOTIDINE 20 MG PO TABS
20.0000 mg | ORAL_TABLET | Freq: Every day | ORAL | Status: DC
  Administered 2020-08-09: 09:00:00 20 mg via ORAL
  Filled 2020-08-08: qty 1

## 2020-08-08 MED ORDER — METOCLOPRAMIDE HCL 5 MG/ML IJ SOLN
10.0000 mg | Freq: Once | INTRAMUSCULAR | Status: AC
  Administered 2020-08-08: 17:00:00 10 mg via INTRAVENOUS
  Filled 2020-08-08: qty 2

## 2020-08-08 MED ORDER — DEXAMETHASONE SODIUM PHOSPHATE 10 MG/ML IJ SOLN
10.0000 mg | Freq: Once | INTRAMUSCULAR | Status: AC
  Administered 2020-08-08: 10 mg via INTRAVENOUS
  Filled 2020-08-08: qty 1

## 2020-08-08 NOTE — Consult Note (Signed)
MEDICATION RELATED CONSULT NOTE - INITIAL   Pharmacy Consult for drug interactions  Allergies  Allergen Reactions   Lipitor [Atorvastatin] Other (See Comments)    Leg pains    Labs: Recent Labs    08/07/20 1213  WBC 6.1  HGB 10.5*  HCT 34.2*  PLT 197  CREATININE 1.76*   Estimated Creatinine Clearance: 34.5 mL/min (A) (by C-G formula based on SCr of 1.76 mg/dL (H)).   Medications:   Current Facility-Administered Medications (Endocrine & Metabolic):    levothyroxine (SYNTHROID) tablet 25 mcg   Current Facility-Administered Medications (Cardiovascular):    carvedilol (COREG) tablet 12.5 mg   isosorbide mononitrate (IMDUR) 24 hr tablet 120 mg   rosuvastatin (CRESTOR) tablet 40 mg   Current Facility-Administered Medications (Respiratory):    diphenhydrAMINE (BENADRYL) injection 25 mg   promethazine (PHENERGAN) tablet 25 mg   Current Facility-Administered Medications (Analgesics):    acetaminophen (TYLENOL) tablet 650 mg **OR** acetaminophen (TYLENOL) 160 MG/5ML solution 650 mg **OR** acetaminophen (TYLENOL) suppository 650 mg   butalbital-acetaminophen-caffeine (FIORICET) 50-325-40 MG per tablet 1 tablet   Current Facility-Administered Medications (Hematological):    clopidogrel (PLAVIX) tablet 75 mg   enoxaparin (LOVENOX) injection 40 mg   Current Facility-Administered Medications (Other):     stroke: mapping our early stages of recovery book   alosetron (LOTRONEX) tablet 1 mg   ALPRAZolam (XANAX) tablet 1 mg   buPROPion (WELLBUTRIN SR) 12 hr tablet 100 mg   desvenlafaxine (PRISTIQ) 24 hr tablet 50 mg   [START ON 08/09/2020] famotidine (PEPCID) tablet 20 mg   gabapentin (NEURONTIN) capsule 600 mg   LORazepam (ATIVAN) tablet 0.5 mg   memantine (NAMENDA) tablet 5 mg   nitrofurantoin (macrocrystal-monohydrate) (MACROBID) capsule 100 mg   polyvinyl alcohol (LIQUIFILM TEARS) 1.4 % ophthalmic solution   senna-docusate (Senokot-S) tablet 1 tablet   traZODone  (DESYREL) tablet 100-200 mg  No current outpatient medications on file.   Assessment: 50 y.o female with history of recurrent CVA on plavix, PAF not on anticoagulation admitted on 08/07/20 with speech problem and right lower extremity weakness and numbness. Pharmacy has been consulted to investigate potential drug-drug interactions with patient's current medication list    Potential identified drug-drug interactions identified  Trazodone/desvenlafaxine: increased serotonergic effects of SNRIs --- increased risk for serotonin syndrome Trazodone/gabapentin: increased CNS depression Carvedilol/Bupropion: Bupropion may increase the effects of carvedilol  Clopidogrel/bupropion: clopidogrel may enhance the effects of bupropion  Clopidogrel/rosuvastatin: clopidogrel may increase the effects of rosuvastatin    Dorothe Pea, PharmD, BCPS Clinical Pharmacist   08/08/2020,6:10 PM

## 2020-08-08 NOTE — ED Notes (Signed)
Echo at bedside

## 2020-08-08 NOTE — Consult Note (Signed)
NEUROLOGY CONSULTATION NOTE   Date of service: August 08, 2020 Patient Name: [name] MRN:  [id number] DOB:  06/29/1970 Reason for consult: complex migraine Requesting physician: Antonieta Pert MD _ _ _   _ __   _ __ _ _  __ __   _ __   __ _  History of Present Illness    This is a 50 yo woman with hx multiple chronic lacunar infarcts, pAF not on AC 2/2 hx GI bleed on plavix monotherapy, HTN, CAD post cardiac stent, recurrent UTI admitted for 3 days severe R sided migraine headache with R leg weakness and dysarthria. Pt states headache feels typical before and that she sometimes has neurologic sx, focal weakness with her migraines. She takes [place] and fioricet prn as o/p. MRI brain negative for acute infarct but did show multiple chronic infarcts and extensive CSVID.   50 year old female with history of recurrent CVA on Plavix, PAF not on anticoagulation secondary to CHF.  In the past, hypertension, UTI on suppressive antibiotics, CAD status post stenting, anemia, migraine, HLD, CKD stage III, anxiety/depression, recurrent UTI came to the ED for a speech problem and right leg weakness. She was found to have possible UTI on admission; continued on suppressive macrobid and urine culture sent. Since admission she has received home gabapentin. She has reglan 21m x1 in ED and prn fioricet with no headache relief. URoselyn Meiernot on formulary here and pt did not bring hers from home.     ROS   Per HPI; all other systems reviewed and are negative  Past History   Past Medical History:  Diagnosis Date   Acute colitis 01/27/2017   Acute pyelonephritis    Anemia    iron deficiency anemia   Aortic arch aneurysm ([place]    BRCA negative 2014   CAD (coronary artery disease)    a. 08/2003 Cath: LAD 30-40-med Rx; b. 11/2014 PCI: LAD 940m3.25x23 Xience Alpine DES); c. 06/2015 PCI: D1 (2.25x12 Resolute Integrity DES); d. 06/2017 PCI: Patent mLAD stent, D2 95 (PTCA); e. 09/2017 PCI: D2 99ost (CBA); d.  12/2017 Cath: LM nl, LAD 3019m0d (small), D1 40ost, D2 95ost, LCX 40p, RCA 40ost/p->Med rx for D2 given restenosis.   Colitis 06/03/2015   Colon polyp    [place] (cerebral vascular accident) ([place]  Left side weakness.    Degenerative tear of glenoid labrum of right shoulder 03/15/2017   Di[place] mellitus without complication ([place]  Family history of breast cancer    BRCA neg 2014   Femur fracture, left ([place]/10/2018   Gastric ulcer 04/27/2011   History of echocardiogram    a. 03/2017 Echo: EF 60-65%, no rwma; b. 02/2018 Echo: EF 60-65%, no rwma. Nl RV fxn. No cardiac source of emboli (admitted w/ stroke).   Hypertension    Malignant melanoma of skin of scalp (HCC)    MI, acute, non ST segment elevation (HCC)    Neuromuscular disorder (HCC)    S/P drug eluting coronary stent placement 06/04/2015   Sepsis ([place]/14/2019   [place]roke ([place]  a. 02/2018 MRI: 7mm43mte acute/early subacute L medial frontal lobe inarct; b. 02/2018 MRA No large vessel occlusion or aneurysm. Mod to sev L P2 stenosis. thready L vertebral artery, diffusely dzs'd; c. 02/2018 Carotid U/S: <50% bilat ICA dzs.   Past Surgical History:  Procedure Laterality Date   APPENDECTOMY     BALLOON ENTEROSCOPY  02/06/2020   [facility]   BIOPSY  N/A 03/14/2020   Procedure: BIOPSY;  Surgeon: [name], MD;  Location: [place];  Service: Endoscopy;  Laterality: N/A;   CARDIAC CATHETERIZATION N/A 11/09/2014   Procedure: Coronary Angiography;  Surgeon: Minna Merritts, MD;  Location: [place] [facility];  Service: Cardiovascular;  Laterality: N/A;   CARDIAC CATHETERIZATION N/A 11/12/2014   Procedure: Coronary Stent Intervention;  Surgeon: Isaias Cowman, MD;  Location: [place] [facility];  Service: Cardiovascular;  Laterality: N/A;   CARDIAC CATHETERIZATION N/A 04/18/2015   Procedure: Left Heart Cath and Coronary Angiography;  Surgeon: Minna Merritts, MD;  Location: [place] [facility];  Service: Cardiovascular;   Laterality: N/A;   CARDIAC CATHETERIZATION Left 06/04/2015   Procedure: Left Heart Cath and Coronary Angiography;  Surgeon: Wellington Hampshire, MD;  Location: [place] [facility];  Service: Cardiovascular;  Laterality: Left;   CARDIAC CATHETERIZATION N/A 06/04/2015   Procedure: Coronary Stent Intervention;  Surgeon: Wellington Hampshire, MD;  Location: [place] [facility];  Service: Cardiovascular;  Laterality: N/A;   CESAREAN SECTION  2001   CHOLECYSTECTOMY N/A 11/18/2016   Procedure: LAPAROSCOPIC CHOLECYSTECTOMY WITH INTRAOPERATIVE CHOLANGIOGRAM;  Surgeon: Christene Lye, MD;  Location: [facility];  Service: General;  Laterality: N/A;   COLONOSCOPY WITH PROPOFOL N/A 04/27/2016   Procedure: COLONOSCOPY WITH PROPOFOL;  Surgeon: [name], MD;  Location: [place];  Service: Endoscopy;  Laterality: N/A;   COLONOSCOPY WITH PROPOFOL N/A 01/12/2018   Procedure: COLONOSCOPY WITH PROPOFOL;  Surgeon: Toledo, Benay Pike, MD;  Location: [facility];  Service: Endoscopy;  Laterality: N/A;   COLONOSCOPY WITH PROPOFOL N/A 03/14/2020   Procedure: COLONOSCOPY WITH PROPOFOL;  Surgeon: [name], MD;  Location: [place];  Service: Endoscopy;  Laterality: N/A;  Needs to be scheduled after 7:30 due to driver issues   CORONARY ANGIOPLASTY     CORONARY BALLOON ANGIOPLASTY N/A 06/29/2017   Procedure: CORONARY BALLOON ANGIOPLASTY;  Surgeon: Wellington Hampshire, MD;  Location: [place] [facility];  Service: Cardiovascular;  Laterality: N/A;   CORONARY BALLOON ANGIOPLASTY N/A 09/20/2017   Procedure: CORONARY BALLOON ANGIOPLASTY;  Surgeon: Wellington Hampshire, MD;  Location: [place] [facility];  Service: Cardiovascular;  Laterality: N/A;   CORONARY STENT INTERVENTION N/A 12/13/2019   Procedure: CORONARY STENT INTERVENTION;  Surgeon: Wellington Hampshire, MD;  Location: [place] [facility];  Service: Cardiovascular;  Laterality: N/A;   DILATION AND CURETTAGE OF UTERUS     ESOPHAGOGASTRODUODENOSCOPY  (EGD) WITH PROPOFOL N/A 09/14/2014   Procedure: ESOPHAGOGASTRODUODENOSCOPY (EGD) WITH PROPOFOL;  Surgeon: Josefine Class, MD;  Location[facility];  Service: Endoscopy;  Laterality: N/A;   ESOPHAGOGASTRODUODENOSCOPY (EGD) WITH PROPOFOL N/A 04/27/2016   Procedure: ESOPHAGOGASTRODUODENOSCOPY (EGD) WITH PROPOFOL;  Surgeon: [name], MD;  Location: [place];  Service: Endoscopy;  Laterality: N/A;  Diabetic - oral meds   ESOPHAGOGASTRODUODENOSCOPY (EGD) WITH PROPOFOL N/A 01/12/2018   Procedure: ESOPHAGOGASTRODUODENOSCOPY (EGD) WITH PROPOFOL;  Surgeon: Toledo, Benay Pike, MD;  Location: [facility];  Service: Endoscopy;  Laterality: N/A;   ESOPHAGOGASTRODUODENOSCOPY (EGD) WITH PROPOFOL N/A 04/11/2019   Procedure: ESOPHAGOGASTRODUODENOSCOPY (EGD) WITH PROPOFOL;  Surgeon: [name], MD;  Location: [facility];  Service: Endoscopy;  Laterality: N/A;   ESOPHAGOGASTRODUODENOSCOPY (EGD) WITH PROPOFOL N/A 03/08/2020   Procedure: ESOPHAGOGASTRODUODENOSCOPY (EGD) WITH PROPOFOL;  Surgeon: [name], MD;  Location: [facility];  Service: Endoscopy;  Laterality: N/A;   GASTRIC BYPASS  09/2009   [place] (IM) NAIL INTERTROCHANTERIC Left 10/13/2018  Procedure: INTRAMEDULLARY (IM) NAIL INTERTROCHANTRIC;  Surgeon: Leandrew Koyanagi, MD;  Location: [place];  Service: Orthopedics;  Laterality: Left;   Left Carotid to sublcavian artery bypass w/ subclavian artery ligation     a. Performed @ [facility].   LEFT HEART CATH AND CORONARY ANGIOGRAPHY Left 06/29/2017   Procedure: LEFT HEART CATH AND CORONARY ANGIOGRAPHY;  Surgeon: Wellington Hampshire, MD;  Location: [place] [facility];  Service: Cardiovascular;  Laterality: Left;   LEFT HEART CATH AND CORONARY ANGIOGRAPHY N/A 09/20/2017   Procedure: LEFT HEART CATH AND CORONARY ANGIOGRAPHY;  Surgeon: Wellington Hampshire, MD;  Location: [place] [facility];  Service: Cardiovascular;  Laterality: N/A;   LEFT HEART CATH AND  CORONARY ANGIOGRAPHY N/A 12/20/2017   Procedure: LEFT HEART CATH AND CORONARY ANGIOGRAPHY;  Surgeon: Wellington Hampshire, MD;  Location: [place] [facility];  Service: Cardiovascular;  Laterality: N/A;   LEFT HEART CATH AND CORONARY ANGIOGRAPHY N/A 04/20/2019   Procedure: LEFT HEART CATH AND CORONARY ANGIOGRAPHY possible PCI;  Surgeon: Minna Merritts, MD;  Location: [place] [facility];  Service: Cardiovascular;  Laterality: N/A;   LEFT HEART CATH AND CORONARY ANGIOGRAPHY N/A 12/13/2019   Procedure: LEFT HEART CATH AND CORONARY ANGIOGRAPHY;  Surgeon: Wellington Hampshire, MD;  Location: [place] [facility];  Service: Cardiovascular;  Laterality: N/A;   MELANOMA EXCISION  2016   Dr. Henreitta Cea ABLATION  2002   RIGHT OOPHORECTOMY     SHOULDER ARTHROSCOPY WITH OPEN ROTATOR CUFF REPAIR Right 01/07/2016   Procedure: SHOULDER ARTHROSCOPY WITH DEBRIDMENT, SUBACHROMIAL DECOMPRESSION;  Surgeon: Corky Mull, MD;  Location: [facility];  Service: Orthopedics;  Laterality: Right;   SHOULDER ARTHROSCOPY WITH OPEN ROTATOR CUFF REPAIR Right 03/16/2017   Procedure: SHOULDER ARTHROSCOPY WITH OPEN ROTATOR CUFF REPAIR POSSIBLE BICEPS TENODESIS;  Surgeon: Corky Mull, MD;  Location: [facility];  Service: Orthopedics;  Laterality: Right;   TRIGGER FINGER RELEASE Right     [place]   Family History  Problem Relation Age of Onset   Hypertension Mother    Anxiety disorder Mother    Depression Mother    Bipolar disorder Mother    Heart disease Mother        No details   Hyperlipidemia Mother    Kidney disease Father    Heart disease Father 88   Hypertension Father    Diabetes Father    Stroke Father    Colon cancer Father        dx in his 78's   Anxiety disorder Father    Depression Father    Skin cancer Father    Kidney disease Sister    Thyroid nodules Sister    Hypertension Sister    Hypertension Sister    Diabetes Sister    Hyperlipidemia Sister    Depression Sister    Breast cancer  Maternal Aunt 47   Breast cancer Maternal Aunt 52   Ovarian cancer Cousin    Colon cancer Cousin    Kidney cancer Cousin    Breast cancer Other    Bladder Cancer Neg Hx    Social History   Socioeconomic History   Marital status: Divorced    Spouse name: Not on file   Number of children: 1   Years of education: Not on file   Highest education level: High school graduate  Occupational History   Occupation: Disabled    Comment: 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 Disabled through 08/02/2013 per forms 02/03/2013  Tobacco Use   Smoking status: Former    Packs/day: 0.25    Years: 1.00    Pack years: 0.25    Types: Cigarettes    Quit date: 08/31/1994    Years since quitting: 25.9   Smokeless tobacco: Never   Tobacco comments:    quit 28 years ago  Vaping Use   Vaping Use: Never used  Substance and Sexual Activity   Alcohol use: No    Alcohol/week: 0.0 standard drinks   Drug use: No   Sexual activity: Not Currently    Birth control/protection: None, Surgical    Comment: Ablation  Other Topics Concern   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   Lives at home with son   Social Determinants of Health   Financial Resource Strain: Low Risk    Difficulty of Paying Living Expenses: Not very hard  Food Insecurity: No Food Insecurity   Worried About Charity fundraiser in the Last Year: Never true   Arboriculturist in the Last Year: Never true  Transportation Needs: No Transportation Needs   Lack of Transportation (Medical): No   Lack of Transportation (Non-Medical): No  Physical Activity: Inactive   Days of Exercise per Week: 0 days   Minutes of Exercise per Session: 0 min  Stress: Stress Concern Present   Feeling of Stress : Very much  Social Connections: Moderately Isolated   Frequency of Communication with Friends  and Family: More than three times a week   Frequency of Social Gatherings with Friends and Family: More than three times a week   Attends Religious Services: More than 4 times per year   Active Member of [employer] or Organizations: No   Attends Archivist Meetings: Never   Marital Status: Divorced   Allergies  Allergen Reactions   Lipitor [Atorvastatin] Other (See Comments)    Leg pains    Medications   Medications Prior to Admission  Medication Sig Dispense Refill Last Dose   alosetron (LOTRONEX) 1 MG tablet Take 1 tablet (1 mg total) by mouth 2 (two) times daily. 60 tablet 5 08/07/2020 at 0800   ALPRAZolam (XANAX) 1 MG tablet TAKE 1 TABLET BY MOUTH TWICE DAILY (Patient taking differently: Take 1 mg by mouth 2 (two) times daily.) 180 tablet 1 08/07/2020 at 0800   amLODipine (NORVASC) 2.5 MG tablet Take 2.5 mg by mouth daily.   08/07/2020 at 0800   buPROPion (WELLBUTRIN SR) 100 MG 12 hr tablet Take 100 mg by mouth 2 (two) times daily.   08/07/2020 at 0800   carvedilol (COREG) 12.5 MG tablet Take 1 tablet (12.5 mg total) by mouth 2 (two) times daily. 60 tablet 6 08/07/2020 at 0800   clopidogrel (PLAVIX) 75 MG tablet Take 75 mg by mouth daily.   08/07/2020 at 0800   Desvenlafaxine ER (PRISTIQ) 50 MG TB24 Take 1 tablet (50 mg total) by mouth every morning. 90 tablet 3 08/07/2020 at 0800   estradiol (ESTRACE) 0.1 MG/GM vaginal cream Estrogen Cream Instruction Discard applicator Apply pea sized amount to tip of finger to urethra before bed. Wash hands well after application. Use Monday, Wednesday and Friday 42.5 g 12 Past Week   gabapentin (NEURONTIN) 300 MG capsule Take 2 capsules (600 mg total) by mouth 2 (two) times daily. 360 capsule 4 08/07/2020 at  0800   isosorbide mononitrate (IMDUR) 120 MG 24 hr tablet Take 1 tablet (120 mg total) by mouth daily. 30 tablet 4 08/07/2020 at 0800   levothyroxine (SYNTHROID) 25 MCG tablet Take 1 tablet by mouth once daily before breakfast (Patient taking differently:  Take 25 mcg by mouth daily before breakfast.) 30 tablet 3 08/07/2020 at 0700   memantine (NAMENDA) 5 MG tablet Take 5 mg by mouth 2 (two) times daily.   08/06/2020 at 0800   nitrofurantoin, macrocrystal-monohydrate, (MACROBID) 100 MG capsule Take 1 capsule (100 mg total) by mouth daily. For 3 months. 90 capsule 0 08/07/2020 at 0800   rosuvastatin (CRESTOR) 40 MG tablet Take 1 tablet (40 mg total) by mouth daily at 6 PM. 90 tablet 3 08/06/2020 at 2000   traMADol (ULTRAM) 50 MG tablet Take 50 mg by mouth every 6 (six) hours as needed.   08/06/2020   traZODone (DESYREL) 100 MG tablet Take 1-2 tablets (100-200 mg total) by mouth at bedtime. 30 tablet 5 08/06/2020 at 2000   BAQSIMI ONE PACK 3 MG/DOSE POWD Place 1 spray into both nostrils as directed.   prn at prn   butalbital-acetaminophen-caffeine (FIORICET) 50-325-40 MG tablet Take 1 tablet by mouth every 6 (six) hours as needed for headache. 14 tablet 0 prn at prn   diclofenac Sodium (VOLTAREN) 1 % GEL SMARTSIG:2 Gram(s) Topical 3 Times Daily (Patient not taking: No sig reported)   Not Taking   diphenoxylate-atropine (LOMOTIL) 2.5-0.025 MG tablet Take 1 tablet by mouth 4 (four) times daily as needed for diarrhea or loose stools. 120 tablet 5 prn at prn   famotidine (PEPCID) 20 MG tablet Take 20 mg by mouth 2 (two) times daily. (Patient not taking: Reported on 08/07/2020)   Not Taking   glucose 4 GM chewable tablet Chew 1 tablet (4 g total) by mouth 3 (three) times daily. 50 tablet 12 prn at prn   nitroGLYCERIN (NITROSTAT) 0.4 MG SL tablet Place 1 tablet (0.4 mg total) under the tongue every 5 (five) minutes as needed for chest pain. 25 tablet 2 prn at prn   Phenazopyridine HCl (AZO TABS PO) Take 2 tablets by mouth daily.   prn at prn   Polyethyl Glycol-Propyl Glycol (SYSTANE OP) Place 1 drop into both eyes daily as needed (dry eyes).   prn at prn   promethazine (PHENERGAN) 25 MG tablet Take 1 tablet (25 mg total) by mouth every 6 (six) hours as needed for nausea or  vomiting. 30 tablet 11 prn at prn   Ubrogepant (UBRELVY) 100 MG TABS Take 100 mg by mouth as needed (migraine).   prn at prn     Vitals   Vitals:   08/08/20 1412 08/08/20 1434 08/08/20 1521 08/08/20 2032  BP: (!) 158/86  (!) 150/83 (!) 148/73  Pulse: 68  (!) 59 88  Resp: '18  16 18  ' Temp: 98.1 F (36.7 C)  98.2 F (36.8 C) 98.1 F (36.7 C)  TempSrc:   Oral Oral  SpO2: 100%  100% 98%  Weight:  65.8 kg    Height:  '5\' 3"'  (1.6 m)       Body mass index is 25.7 kg/m.  Physical Exam   Physical Exam Gen: A&O x4, NAD HEENT: Atraumatic, normocephalic;mucous membranes moist; oropharynx clear, tongue without atrophy or fasciculations. Neck: Supple, trachea midline. Resp: CTAB, no w/r/r CV: RRR, no m/g/r; nml S1 and S2. 2+ symmetric peripheral pulses. Abd: soft/NT/ND; nabs x 4 quad Extrem: Nml bulk; no cyanosis, clubbing,  or edema.  Neuro: *MS: A&O x4. Follows multi-step commands.  *Speech: mild dysarthria, no aphasia *CN:    I: Deferred   II,III: PERRLA, VFF by confrontation   III,IV,VI: EOMI w/o nystagmus, no ptosis   V: Sensation intact from V1 to V3 to LT   VII: Eyelid closure was full.  Smile symmetric.   VIII: Hearing intact to voice   IX,X: Voice normal, palate elevates symmetrically    XI: SCM/trap 5/5 bilat   XII: Tongue protrudes midline, no atrophy or fasciculations  *Motor:   Normal bulk.  No tremor, rigidity or bradykinesia. No pronator drift BUE. Strength exam highly effort-dependent with giveway weakness but at least 4+/5 BUE throughout and 4/5 LLE and 4-/5 RLE throughout with coaching.  *Sensory: SILT *Coordination:  FNF intact bilat *Reflexes:  2+ and symmetric throughout without clonus; toes down-going bilat *Gait: deferred   Labs   CBC:  Recent Labs  Lab 08/07/20 1213  WBC 6.1  HGB 10.5*  HCT 34.2*  MCV 81.8  PLT 098    Basic Metabolic Panel:  Lab Results  Component Value Date   NA 136 08/07/2020   K 4.8 08/07/2020   CO2 24 08/07/2020    GLUCOSE 90 08/07/2020   BUN 17 08/07/2020   CREATININE 1.76 (H) 08/07/2020   CALCIUM 8.8 (L) 08/07/2020   GFRNONAA 35 (L) 08/07/2020   GFRAA 29 (L) 03/13/2020   Lipid Panel:  Lab Results  Component Value Date   LDLCALC 121 (H) 08/08/2020   HgbA1c:  Lab Results  Component Value Date   HGBA1C 5.3 08/08/2020   Urine Drug Screen:     Component Value Date/Time   LABOPIA NONE DETECTED 11/06/2019 0519   LABOPIA POSITIVE (A) 07/10/2009 2040   COCAINSCRNUR NONE DETECTED 11/06/2019 0519   LABBENZ POSITIVE (A) 11/06/2019 0519   LABBENZ NONE DETECTED 07/10/2009 2040   AMPHETMU NONE DETECTED 11/06/2019 0519   AMPHETMU NONE DETECTED 07/10/2009 2040   THCU NONE DETECTED 11/06/2019 0519   THCU NONE DETECTED 07/10/2009 2040   LABBARB NONE DETECTED 11/06/2019 0519   LABBARB  07/10/2009 2040    NONE DETECTED        DRUG SCREEN FOR MEDICAL PURPOSES ONLY.  IF CONFIRMATION IS NEEDED FOR ANY PURPOSE, NOTIFY LAB WITHIN 5 DAYS.        LOWEST DETECTABLE LIMITS FOR URINE DRUG SCREEN Drug Class       Cutoff (ng/mL) Amphetamine      1000 Barbiturate      200 Benzodiazepine   119 Tricyclics       147 Opiates          300 Cocaine          300 THC              50    Alcohol Level     Component Value Date/Time   ETH <10 03/01/2018 1850     Impression   This is a 50 yo woman with hx multiple chronic lacunar infarcts, pAF not on AC 2/2 hx GI bleed on plavix monotherapy, HTN, CAD post cardiac stent, recurrent UTI admitted for 3 days severe R sided migraine headache with R leg weakness and dysarthria c/w prior hx complex migraine. MRI brain showed no acute infarct. Possible recrudescence of prior stroke sx contributing given possible UTI; continued on suppressive macrobid until urine culture results. Neurology consulted for tx assistance for complex migraine  Recommendations   - Ordered reglan IV 27m x once + decadron 175mIV  once - Ubrelvy or nurtec prn for abortive if able to approve  through pharmacy  - Will re-evaluate in AM ______________________________________________________________________   Thank you for the opportunity to take part in the care of this patient. If you have any further questions, please contact the neurology consultation attending.  Signed,  Su Monks, MD [facility] [phone]  If 7pm- 7am, please page neurology on call as listed in [place].'

## 2020-08-08 NOTE — ED Notes (Signed)
X-ray at bedside

## 2020-08-08 NOTE — Evaluation (Signed)
Physical Therapy Evaluation Patient Details Name: [name] MRN: [id number] DOB: 1970/09/12 Today's Date: 08/08/2020   History of Present Illness  Patient is a 50 y.o. female with medical history significant of recurrent CVA on Plavix, PAF not on anticoagulation secondary to severe GI bleed in the past, HTN, migraines, anemia, CAD status post stenting, HLD, CKD stage III, anxiety/depression, recurrent UTI on suppression antibiotics, came with speech problem and right leg weakness. Patient fell on right side last month with right wrist fracture.   Clinical Impression  Patient agreeable to PT. Patient reports she has had 3 falls in the past month that resulted in injuries including a facial fracture, right wrist fracture, and concussion. She reports she is a limited community ambulator at baseline and occasionally uses a 4 wheeled walker.  Patient currently has generalized weakness that seems more pronounced in RLE. History of peripheral neuropathy, however patient unable to detect light touch sensation distal to right knee during my exam. She ambulated in hallway with occasional unsteadiness that required Min A for support. Seated rest break required with ambulation as she felt the right knee would give out. Discussed rehab options with the patient and she is declining to go anywhere other than home for rehab. She is requesting to have HHPT. If patient is unwilling to go to SNF for rehab, she needs HHPT and supervision with mobility for fall prevention. Recommend PT follow up to maximize independence and facilitate return to prior level of function.     Follow Up Recommendations Home health PT;Supervision for mobility/OOB    Equipment Recommendations  None recommended by PT (patient has DME in place at home)    Recommendations for Other Services       Precautions / Restrictions Precautions Precautions: Fall Restrictions Weight Bearing Restrictions: Yes RUE Weight Bearing:  (likely  NWB right wrist with hard cast in place (previous but recent fracture))      Mobility  Bed Mobility Overal bed mobility: Needs Assistance Bed Mobility: Supine to Sit;Sit to Supine     Supine to sit: Min assist;HOB elevated Sit to supine: Supervision;HOB elevated   General bed mobility comments: patient required assistance for trunk support to sit upright. verbal cues for technique    Transfers Overall transfer level: Needs assistance Equipment used: 1 person hand held assist Transfers: Sit to/from Stand Sit to Stand: Min guard         General transfer comment: 2 bouts of standing performed. Min guard needed for safety and patient using momentum for lift off  Ambulation/Gait Ambulation/Gait assistance: Min guard;Min assist Gait Distance (Feet): 100 Feet Assistive device: 1 person hand held assist (with seated rest break required) Gait Pattern/deviations: Narrow base of support;Decreased stride length Gait velocity: decreased   General Gait Details: patient ambulated into hallway with hand held assistance. occasional unsteadiness that required Min A for correction to midline. patient reports she feels her right knee will give out and she required a short seated rest break to complete walk back to her room. patient appears generally weak and reports multiple falls within the past month, resulting in injury  Stairs            Wheelchair Mobility    Modified Rankin (Stroke Patients Only)       Balance Overall balance assessment: Needs assistance;History of Falls Sitting-balance support: Feet supported Sitting balance-Leahy Scale: Fair Sitting balance - Comments: posterior lean initially that is self corrected with increased sitting time   Standing balance support: No upper extremity  supported Standing balance-Leahy Scale: Poor Standing balance comment: without UE support, patient reuqired assistance of a person to maintain standing balance with mild trunk sway                              Pertinent Vitals/Pain Pain Assessment: Faces Faces Pain Scale: Hurts a little bit Pain Location: right leg Pain Descriptors / Indicators: Discomfort Pain Intervention(s): Limited activity within patient's tolerance    Home Living Family/patient expects to be discharged to:: Private residence Living Arrangements: Children Available Help at Discharge: Family;Available PRN/intermittently Type of Home: House Home Access: Ramped entrance     Home Layout: One level Home Equipment: [place] - 4 wheels;Cane - single point;Shower seat      Prior Function Level of Independence: Independent         Comments: patient reports occasional use of rollator, however has been unable to use recently due to wrist fracture. patient reports 3 falls in the past month resulting in concussion, wrist fracture and cheek fracture. limited ambulation distance at baseline for community distances. she reports independent with ADLs. and she drives occasionally, short distances     Hand Dominance        Extremity/Trunk Assessment   Upper Extremity Assessment Upper Extremity Assessment: Generalized weakness (right wrist in a hard cast due to recent fracture. patient able to activate shoulder, elbow, and digit movement)    Lower Extremity Assessment Lower Extremity Assessment: RLE deficits/detail;LLE deficits/detail RLE Deficits / Details: knee extension 4/5, hip add/abd 4+/5, dorsiflexion 4/5, plantarflexion 4/5 RLE Sensation: decreased light touch;history of peripheral neuropathy (from knee and distally, unable to detect light touch) LLE Deficits / Details: knee extension 4+/5, hip add/abd 5/5, dorsiflexion 4+/5, plantarflexion 4+/5 LLE Sensation: history of peripheral neuropathy (able to detect light touch sensation)       Communication   Communication: No difficulties  Cognition Arousal/Alertness: Awake/alert Behavior During Therapy: WFL for tasks  assessed/performed Overall Cognitive Status: Within Functional Limits for tasks assessed                                 General Comments: patient able to follow commands without difficulty. she reports she was very confused yesterday, however this seems to be resolved      General Comments General comments (skin integrity, edema, etc.): patient requires frequent cues to maintain NWB of right wrist with functional activity but continues to use this arm with functional activity. patient reports mild dizziness with activity. Sp02 100% on room air, blood pressure 195/101 mmHg (nurse alerted)    Exercises     Assessment/Plan    PT Assessment Patient needs continued PT services  PT Problem List Decreased strength;Decreased activity tolerance;Decreased balance;Decreased mobility;Impaired sensation;Decreased safety awareness       PT Treatment Interventions DME instruction;Gait training;Functional mobility training;Therapeutic activities;Therapeutic exercise;Balance training;Neuromuscular re-education;Patient/family education    PT Goals (Current goals can be found in the Care Plan section)  Acute Rehab PT Goals Patient Stated Goal: to go home PT Goal Formulation: With patient Time For Goal Achievement: 08/22/20 Potential to Achieve Goals: Fair    Frequency Min 2X/week   Barriers to discharge Decreased caregiver support patient wants to be discharged home. she lives with her son who works    Co-evaluation               AM-PAC PT "6 Clicks" Mobility  Outcome Measure Help  needed turning from your back to your side while in a flat bed without using bedrails?: None Help needed moving from lying on your back to sitting on the side of a flat bed without using bedrails?: A Little Help needed moving to and from a bed to a chair (including a wheelchair)?: A Little Help needed standing up from a chair using your arms (e.g., wheelchair or bedside chair)?: A Little Help  needed to walk in hospital room?: A Little Help needed climbing 3-5 steps with a railing? : A Little 6 Click Score: 19    End of Session Equipment Utilized During Treatment: Gait belt Activity Tolerance: Patient limited by fatigue Patient left: in bed;with call bell/phone within reach Nurse Communication: Mobility status PT Visit Diagnosis: Unsteadiness on feet (R26.81);Repeated falls (R29.6);Muscle weakness (generalized) (M62.81)    Time: 3524-8185 PT Time Calculation (min) (ACUTE ONLY): 29 min   Charges:   PT Evaluation $PT Eval Moderate Complexity: 1 Mod PT Treatments $Therapeutic Activity: 8-22 mins        Minna Merritts, PT, MPT   Percell Locus 08/08/2020, 9:39 AM

## 2020-08-08 NOTE — ED Notes (Signed)
Informed RN bed assigned 

## 2020-08-08 NOTE — ED Notes (Addendum)
Hosptitalist Kc at bedside

## 2020-08-08 NOTE — Progress Notes (Addendum)
SLP Cancellation Note  Patient Details Name: [name] MRN: [id number] DOB: [birth year]   Cancelled treatment:       Reason Eval/Treat Not Completed: SLP screened, no needs identified, will sign off (chart reviewed; consulted NSG then met w/ pt in room). Pt denied any difficulty swallowing and is currently on a regular diet; tolerates swallowing pills w/ water per NSG. Pt was feeding herself bites of her Dinner meal -- slowly and methodically. Pt conversed w/ NSG then w/ SLP in general conversation w/out gross, overt deficits noted; pt stated her speech was "back to normal now" and denied immediate confusion. However, pt does not remember taking one of her Pain Meds, Fioricet, for her headache ~2-3 days ago. NSG denied noting any difficulty w/ pt's speech-language abilities since being admitted to the room -- pt is expressing her wants/needs appropriately.  Of note per chart review of Home Meds, pt takes multiple pain medications which can have impact such as confusion, dizziness, drowsiness, and difficulty concentrating. Pt has h/o several Falls.  No further skilled ST services indicated as pt appears at her baseline currently. Noted pt's participation in OT/PT evals today. Pt agreed. NSG to reconsult if any change in status while admitted.      [name], MS, CCC-SLP Speech Language Pathologist Rehab Services [phone] [facility] 08/08/2020, 5:19 PM

## 2020-08-08 NOTE — Evaluation (Signed)
Occupational Therapy Evaluation Patient Details Name: [name] MRN: [id number] DOB: 05/21/1970 Today's Date: 08/08/2020    History of Present Illness Patient is a 50 y.o. female with medical history significant of recurrent CVA on Plavix, PAF not on anticoagulation secondary to severe GI bleed in the past, HTN, migraines, anemia, CAD status post stenting, HLD, CKD stage III, anxiety/depression, recurrent UTI on suppression antibiotics, came with speech problem and right leg weakness. Patient fell on right side last month with right wrist fracture.   Clinical Impression   Pt seen for OT evaluation this date in setting of acute hospitalization d/t c/o LE weakness and concern for CVA. Pt reports that she has rollator at home that she occasionally uses, but describes primarily furniture walking in the home. Pt endorses several falls in recent months. On assessment this date, pt presents with general weakness and decreased balance/functional activity tolerance. Pt requires SETUP to MIN A for seated UB ADLs, MIN A for seated LB dressing, CGA/MIN A for ADL transfers with HHA. Pt left in bed with all needs met and in reach. Will continue to follow acutely. Anticipate she will require HHOT f/u.     Follow Up Recommendations  Home health OT    Equipment Recommendations  None recommended by OT (pt reports having all necessary equipment)    Recommendations for Other Services       Precautions / Restrictions Precautions Precautions: Fall Restrictions Weight Bearing Restrictions: Yes RUE Weight Bearing:  (right wrist/lower arm in hard cast)      Mobility Bed Mobility Overal bed mobility: Needs Assistance Bed Mobility: Supine to Sit;Sit to Supine     Supine to sit: Min assist;HOB elevated Sit to supine: Supervision;HOB elevated   General bed mobility comments: increased time    Transfers Overall transfer level: Needs assistance Equipment used: 1 person hand held  assist Transfers: Sit to/from Stand Sit to Stand: Min guard;Min assist         General transfer comment: increased time/effort, noted to hace poor control for lift off/descent.    Balance Overall balance assessment: Needs assistance;History of Falls Sitting-balance support: Feet supported Sitting balance-Leahy Scale: Fair Sitting balance - Comments: posterior lean initially, with increased time and cue to adjust hips, pt able to stabilize static sitting   Standing balance support: Single extremity supported Standing balance-Leahy Scale: Poor Standing balance comment: pt with unsteadiness/sway noted w/o UE support, requires at least unilateral support to safely maintain static standing. Easily thrown off balance with a challenge.                           ADL either performed or assessed with clinical judgement   ADL Overall ADL's : Needs assistance/impaired                                       General ADL Comments: SETUP to MIN A for seated UB ADLs, MIN A for seated LB dressing, CGA for ADL transfers with HHA.     Vision Baseline Vision/History: Wears glasses Patient Visual Report: No change from baseline       Perception     Praxis      Pertinent Vitals/Pain Pain Assessment: Faces Faces Pain Scale: Hurts a little bit Pain Location: right leg Pain Descriptors / Indicators: Discomfort Pain Intervention(s): Limited activity within patient's tolerance     Hand Dominance  Right   Extremity/Trunk Assessment Upper Extremity Assessment Upper Extremity Assessment: Generalized weakness;RUE deficits/detail;LUE deficits/detail RUE Deficits / Details: R wrist in hard cast. Pt with shld, elbow and digit ROM WFL, MMT of shld grossly 4-/5 LUE Deficits / Details: limited grip strength which pt reports is baseline, grip MMT grossly 4-/5. shld, elbow, wrist ROM WFL           Communication Communication Communication: No difficulties   Cognition  Arousal/Alertness: Awake/alert Behavior During Therapy: WFL for tasks assessed/performed Overall Cognitive Status: Within Functional Limits for tasks assessed                                 General Comments: oriented and appropriate with commands, some decreased insight into defiicits/decreased safety awareness.   General Comments       Exercises Other Exercises Other Exercises: OT ed re: importance of OOB activity, strengthening (specifically trunk) for fall prevention, benefits of therapy participation, potetntial need for OT once cast removed.   Shoulder Instructions      Home Living Family/patient expects to be discharged to:: Private residence Living Arrangements: Children (son lives with her) Available Help at Discharge: Family;Available PRN/intermittently (son works for [employer], occupational) Type of Home: House Home Access: [place]: One level     Bathroom Shower/Tub: Aeronautical engineer: Environmental consultant - 4 wheels;Cane - single point;Shower seat          Prior Functioning/Environment Level of Independence: Independent with assistive device(s)        Comments: patient reports occasional use of rollator, however has been unable to use recently due to wrist fracture. patient reports 3 falls in the past month resulting in concussion, wrist fracture and cheek fracture. limited ambulation distance at baseline for community distances. she reports independent with ADLs. and she drives occasionally, short distances        OT Problem List: Decreased strength;Decreased activity tolerance;Impaired balance (sitting and/or standing);Decreased coordination;Impaired UE functional use      OT Treatment/Interventions: Self-care/ADL training;DME and/or AE instruction;Therapeutic activities;Therapeutic exercise;Balance training;Patient/family education    OT Goals(Current goals can be found in the care plan section) Acute Rehab OT  Goals Patient Stated Goal: to go home OT Goal Formulation: With patient Time For Goal Achievement: 08/22/20 Potential to Achieve Goals: Good ADL Goals Pt Will Perform Upper Body Dressing: with supervision;sitting (with hemi dressing technique as needed) Pt Will Perform Lower Body Dressing: with supervision;sit to/from stand (with LRAD/AE PRN) Pt Will Transfer to Toilet: with supervision;ambulating (with LRAD PRN to/from commode in restroom with <2 instances of near LOB/improved safety awareness when negotiating environment) Pt Will Perform Toileting - Clothing Manipulation and hygiene: with supervision;sit to/from stand  OT Frequency: Min 1X/week   Barriers to D/C:            Co-evaluation              AM-PAC OT "6 Clicks" Daily Activity     Outcome Measure Help from another person eating meals?: None Help from another person taking care of personal grooming?: None Help from another person toileting, which includes using toliet, bedpan, or urinal?: A Little Help from another person bathing (including washing, rinsing, drying)?: A Little Help from another person to put on and taking off regular upper body clothing?: A Little Help from another person to put on and taking off regular lower body  clothing?: A Little 6 Click Score: 20   End of Session Equipment Utilized During Treatment: Gait belt;Rolling walker Nurse Communication: Mobility status  Activity Tolerance: Patient tolerated treatment well Patient left: in bed;with call bell/phone within reach  OT Visit Diagnosis: Unsteadiness on feet (R26.81);Muscle weakness (generalized) (M62.81);History of falling (Z91.81);Repeated falls (R29.6)                Time: 0018-0970 OT Time Calculation (min): 20 min Charges:  OT General Charges $OT Visit: 1 Visit OT Evaluation $OT Eval Moderate Complexity: 1 Mod OT Treatments $Self Care/Home Management : 8-22 mins  Gerrianne Scale, MS, OTR/L ascom [phone] 08/08/20, 5:25 PM

## 2020-08-08 NOTE — Progress Notes (Signed)
Marland Kitchen   PROGRESS NOTE    [name]  BMB:[id number] DOB: 1970/08/20 DOA: 08/07/2020 PCP: [name], MD   Chief Complaint  Patient presents with   Weakness   Brief Narrative: 50 year old female with history of recurrent CVA on Plavix, PAF not on anticoagulation secondary to CHF.  In the past, hypertension, UTI on suppressive antibiotics, CAD status post stenting, anemia, migraine, HLD, CKD stage III, anxiety/depression, recurrent UTI came to the ED for a speech problem and right leg weakness. As per the report patient had mild flareup 3 days ago with right-sided "pounding leg headache", for which she took Fioricet with little help.  No vision changes. Same day, family noticed her speech became slurred. She felt her talking was slow and tongue sluggish.  She thought the symptoms might be related to her headache and did not pay attention, and symptoms improved next day.   7/6- she developed right-sided lower extremity weakness and numbness.  Last month, she fell on her right sided and broke her right wrist.  She has been taking narcotics found to few weeks ago, but currently she does not take any narcotics.  She has had multiple UTIs this year and went to see her urologist last month was started on suppressive Macrobid.  She denied any history of back pain or lumbar spine problems. In ED-CT head negative exam showed decreased muscle strength in the right lower extremities. Patient was admitted for CVA versus complex migraine, headache, pyuria-WBC more than 50, nitrite positive, leukocytes large, urine culture was sent  Subjective: Seen and examined this morning complains of headache, right knee buckling, right leg weakness   Assessment & Plan:  Right-sided lower extremity weakness/numbness:CVA versus complex migraine:acute stroke ruled out MRI negative for acute stroke, ESR normal 28. Suspect complex migraine, work-up with normal A1c 5.3 LDL 121.  Patient on Crestor and Plavix  continue same.  Formally  requested neurology consultation due to ongoing symptoms.  Has  bruise of the right knee x-ray was unremarkable-suspect worsening weakness due to trauma.PT OT evaluation.Trial of Reglan/Benadryl, continue gabapentin and Fioricet  Headache with history of complex migraine ESR normal plan as above  UTI on suppressive therapy continue same follow-up urine culture. Holding pyridium due to low creat clearance.  CKD stage IIIb: baseline creat 1.6-1.7- monitor. Recent Labs  Lab 08/07/20 1213  BUN 17  CREATININE 1.76*     PAF in sinus rhythm not on anticoagulation due to severe GI bleed in the past Hypertension-poorly controlled, continue home meds Anxiety/depression continue SSRI, Namenda CAD cath in 11/21 stable. Hypothyroidism continue Synthroid  Diet Order             Diet Heart Room service appropriate? Yes; Fluid consistency: Thin  Diet effective now                  Patient's Body mass index is 25.69 kg/m. DVT prophylaxis: enoxaparin (LOVENOX) injection 40 mg Start: 08/07/20 2200 Code Status:   Code Status: Full Code  Family Communication: plan of care discussed with patient at bedside.  Status is: Observation The patient remains OBS appropriate and will d/c before 2 midnights. Dispo: The patient is from: Home              Anticipated d/c is to: Home              Patient currently is not medically stable to d/c.   Difficult to place patient No Unresulted Labs (From admission, onward)  Start     Ordered   08/07/20 1731  Urine culture  Add-on,   AD        08/07/20 1731            Medications reviewed:  Scheduled Meds:   stroke: mapping our early stages of recovery book   Does not apply Once   alosetron  1 mg Oral BID   ALPRAZolam  1 mg Oral BID   buPROPion  100 mg Oral BID   carvedilol  12.5 mg Oral BID   clopidogrel  75 mg Oral Daily   desvenlafaxine  50 mg Oral q morning   diphenhydrAMINE  25 mg Intravenous Once   enoxaparin  (LOVENOX) injection  40 mg Subcutaneous Q24H   famotidine  20 mg Oral BID   gabapentin  600 mg Oral BID   isosorbide mononitrate  120 mg Oral Daily   levothyroxine  25 mcg Oral Q0600   memantine  5 mg Oral BID   metoCLOPramide (REGLAN) injection  10 mg Intravenous Once   nitrofurantoin (macrocrystal-monohydrate)  100 mg Oral Daily   rosuvastatin  40 mg Oral q1800   traZODone  100-200 mg Oral QHS   Continuous Infusions:  Consultants:see note  Procedures:see note  Antimicrobials: Anti-infectives (From admission, onward)    Start     Dose/Rate Route Frequency Ordered Stop   08/08/20 1000  nitrofurantoin (macrocrystal-monohydrate) (MACROBID) capsule 100 mg       Note to Pharmacy: For 3 months.     100 mg Oral Daily 08/07/20 1737     08/07/20 1745  cefTRIAXone (ROCEPHIN) 1 g in sodium chloride 0.9 % 100 mL IVPB        1 g 200 mL/hr over 30 Minutes Intravenous  Once 08/07/20 1731 08/07/20 1829      Culture/Microbiology    Component Value Date/Time   SDES BLOOD LEFT FOREARM 04/23/2020 1146   SPECREQUEST  04/23/2020 1146    BOTTLES DRAWN AEROBIC AND ANAEROBIC Blood Culture results may not be optimal due to an inadequate volume of blood received in culture bottles   CULT  04/23/2020 1146    NO GROWTH 5 DAYS Performed at [facility], [address], [place], [place] [address]    REPTSTATUS 04/28/2020 FINAL 04/23/2020 1146    Other culture-see note  Objective: Vitals: Today's Vitals   08/08/20 1200 08/08/20 1230 08/08/20 1330 08/08/20 1412  BP:    (!) 158/86  Pulse: 80 76 70 68  Resp:    18  Temp:    98.1 F (36.7 C)  TempSrc:      SpO2: 95% 96% 97% 100%  Weight:      Height:      PainSc:       No intake or output data in the 24 hours ending 08/08/20 1426 Filed Weights   08/07/20 1216  Weight: 65.8 kg   Weight change:   Intake/Output from previous day: No intake/output data recorded. Intake/Output this shift: No intake/output data recorded. Filed  Weights   08/07/20 1216  Weight: 65.8 kg    Examination: General exam: AAO X3 , older than stated age, weak appearing. HEENT:Oral mucosa moist, Ear/Nose WNL grossly,dentition normal. Respiratory system: bilaterally diminished, no crackles,no use of accessory muscle, non tender. Cardiovascular system: S1 & S2 +,no JVD. Gastrointestinal system: Abdomen soft, NT,ND, BS+. Nervous System:Alert, awake, moving extremities Extremities: no edema, distal peripheral pulses palpable.  Skin: No rashes,no icterus. MSK: Normal muscle bulk,tone, power.  Bruise on the right knee  Data Reviewed: I have personally reviewed following labs and imaging studies CBC: Recent Labs  Lab 08/07/20 1213  WBC 6.1  HGB 10.5*  HCT 34.2*  MCV 81.8  PLT 706   Basic Metabolic Panel: Recent Labs  Lab 08/07/20 1213  NA 136  K 4.8  CL 103  CO2 24  GLUCOSE 90  BUN 17  CREATININE 1.76*  CALCIUM 8.8*   GFR: Estimated Creatinine Clearance: 34.5 mL/min (A) (by C-G formula based on SCr of 1.76 mg/dL (H)). Liver Function Tests: No results for input(s): AST, ALT, ALKPHOS, BILITOT, PROT, ALBUMIN in the last 168 hours. No results for input(s): LIPASE, AMYLASE in the last 168 hours. No results for input(s): AMMONIA in the last 168 hours. Coagulation Profile: No results for input(s): INR, PROTIME in the last 168 hours. Cardiac Enzymes: No results for input(s): CKTOTAL, CKMB, CKMBINDEX, TROPONINI in the last 168 hours. BNP (last 3 results) No results for input(s): PROBNP in the last 8760 hours. HbA1C: Recent Labs    08/08/20 0601  HGBA1C 5.3   CBG: No results for input(s): GLUCAP in the last 168 hours. Lipid Profile: Recent Labs    08/08/20 0601  CHOL 180  HDL 35*  LDLCALC 121*  TRIG 118  CHOLHDL 5.1   Thyroid Function Tests: No results for input(s): TSH, T4TOTAL, FREET4, T3FREE, THYROIDAB in the last 72 hours. Anemia Panel: No results for input(s): VITAMINB12, FOLATE, FERRITIN, TIBC, IRON,  RETICCTPCT in the last 72 hours. Sepsis Labs: No results for input(s): PROCALCITON, LATICACIDVEN in the last 168 hours.  Recent Results (from the past 240 hour(s))  Resp Panel by RT-PCR (Flu A&B, Covid) Nasopharyngeal Swab     Status: None   Collection Time: 08/07/20  3:03 PM   Specimen: Nasopharyngeal Swab; Nasopharyngeal(NP) swabs in vial transport medium  Result Value Ref Range Status   SARS Coronavirus 2 by RT PCR NEGATIVE NEGATIVE Final    Comment: (NOTE) SARS-CoV-2 target nucleic acids are NOT DETECTED.  The SARS-CoV-2 RNA is generally detectable in upper respiratory specimens during the acute phase of infection. The lowest concentration of SARS-CoV-2 viral copies this assay can detect is 138 copies/mL. A negative result does not preclude SARS-Cov-2 infection and should not be used as the sole basis for treatment or other patient management decisions. A negative result may occur with  improper specimen collection/handling, submission of specimen other than nasopharyngeal swab, presence of viral mutation(s) within the areas targeted by this assay, and inadequate number of viral copies(<138 copies/mL). A negative result must be combined with clinical observations, patient history, and epidemiological information. The expected result is Negative.  Fact Sheet for Patients:  EntrepreneurPulse.com.au  Fact Sheet for Healthcare Providers:  IncredibleEmployment.be  This test is no t yet approved or cleared by the [place] FDA and  has been authorized for detection and/or diagnosis of SARS-CoV-2 by FDA under an Emergency Use Authorization (EUA). This EUA will remain  in effect (meaning this test can be used) for the duration of the COVID-19 declaration under Section 564(b)(1) of the Act, 21 U.S.C.section 360bbb-3(b)(1), unless the authorization is terminated  or revoked sooner.       Influenza A by PCR NEGATIVE NEGATIVE Final   Influenza  B by PCR NEGATIVE NEGATIVE Final    Comment: (NOTE) The Xpert Xpress SARS-CoV-2/FLU/RSV plus assay is intended as an aid in the diagnosis of influenza from Nasopharyngeal swab specimens and should not be used as a sole basis for treatment. Nasal washings and aspirates are unacceptable for  Xpert Xpress SARS-CoV-2/FLU/RSV testing.  Fact Sheet for Patients: EntrepreneurPulse.com.au  Fact Sheet for Healthcare Providers: IncredibleEmployment.be  This test is not yet approved or cleared by the [place] FDA and has been authorized for detection and/or diagnosis of SARS-CoV-2 by FDA under an Emergency Use Authorization (EUA). This EUA will remain in effect (meaning this test can be used) for the duration of the COVID-19 declaration under Section 564(b)(1) of the Act, 21 U.S.C. section 360bbb-3(b)(1), unless the authorization is terminated or revoked.  Performed at [facility], [address], [place], [place] [address]      Radiology Studies: DG Knee 2 Views Right  Result Date: 08/08/2020 CLINICAL DATA:  Acute right knee pain. EXAM: RIGHT KNEE - 1-2 VIEW COMPARISON:  None. FINDINGS: No evidence of fracture, dislocation, or joint effusion. No evidence of arthropathy or other focal bone abnormality. Soft tissues are unremarkable. IMPRESSION: Negative. Electronically Signed   By: Marijo Conception M.D.   On: 08/08/2020 08:32   CT Head Wo Contrast  Result Date: 08/07/2020 CLINICAL DATA:  Neuro deficit, acute stroke suspected. EXAM: CT HEAD WITHOUT CONTRAST TECHNIQUE: Contiguous axial images were obtained from the base of the skull through the vertex without intravenous contrast. COMPARISON:  CT head 06/17/2020 FINDINGS: Brain: Patchy and confluent areas of decreased attenuation are noted throughout the deep and periventricular white matter of the cerebral hemispheres bilaterally, compatible with chronic microvascular ischemic disease.  Similar-appearing right basilar lacunar infarction. No evidence of large-territorial acute infarction. No parenchymal hemorrhage. No mass lesion. No extra-axial collection. No mass effect or midline shift. No hydrocephalus. Basilar cisterns are patent. Vascular: No hyperdense vessel. Atherosclerotic calcifications are present within the cavernous internal carotid and vertebral arteries. Skull: No acute fracture or focal lesion. Sinuses/Orbits: Paranasal sinuses and mastoid air cells are clear. Bilateral lens replacement. Otherwise the orbits are unremarkable. Other: None. IMPRESSION: No acute intracranial abnormality. Electronically Signed   By: Iven Finn M.D.   On: 08/07/2020 15:23   MR BRAIN WO CONTRAST  Result Date: 08/07/2020 CLINICAL DATA:  Right-sided headache, difficulty speaking, memory disturbance, right leg weakness, and decreased sensation in both legs. EXAM: MRI HEAD WITHOUT CONTRAST TECHNIQUE: Multiplanar, multiecho pulse sequences of the brain and surrounding structures were obtained without intravenous contrast. COMPARISON:  Head CT 08/07/2020 and MRI 11/05/2019 FINDINGS: Brain: There is no evidence of an acute infarct, mass, midline shift, or extra-axial fluid collection. A few scattered chronic cerebral microhemorrhages are again noted in a nonspecific pattern. Patchy and confluent T2 hyperintensities throughout the cerebral white matter and pons have not significantly changed from the prior MRI and are nonspecific but compatible with severe chronic small vessel ischemic disease. Chronic lacunar infarcts are again seen in the pons, bilateral basal ganglia, thalami, and deep cerebral white matter. There also numerous dilated perivascular spaces in the basal ganglia. The ventricles are normal in size. Vascular: Major intracranial vascular flow voids are preserved. Skull and upper cervical spine: Unremarkable bone marrow signal. Sinuses/Orbits: Bilateral cataract extraction. Paranasal sinuses  and mastoid air cells are clear. Other: None. IMPRESSION: 1. No acute intracranial abnormality. 2. Severe chronic small vessel ischemic disease with multiple chronic lacunar infarcts. Electronically Signed   By: Logan Bores M.D.   On: 08/07/2020 20:25   US Carotid Bilateral (at [facility] and [facility] only)  Result Date: 08/08/2020 CLINICAL DATA:  CVA. Hypertension. Stroke/TIA. Prior coronary artery disease. Hyperlipidemia. Diabetes. Vascular surgery. EXAM: BILATERAL CAROTID DUPLEX ULTRASOUND TECHNIQUE: Pearline Cables scale imaging, color Doppler and duplex ultrasound were performed of  bilateral carotid and vertebral arteries in the neck. COMPARISON:  CT neck 09/20/2019 FINDINGS: Criteria: Quantification of carotid stenosis is based on velocity parameters that correlate the residual internal carotid diameter with NASCET-based stenosis levels, using the diameter of the distal internal carotid lumen as the denominator for stenosis measurement. The following velocity measurements were obtained: RIGHT ICA: 54/20 cm/sec CCA: 62/03 cm/sec SYSTOLIC ICA/CCA RATIO:  1.2 ECA: 52 cm/sec LEFT ICA: 52/15 cm/sec CCA: 559/7 cm/sec SYSTOLIC ICA/CCA RATIO:  0.5 ECA: 58 cm/sec RIGHT CAROTID ARTERY: Mild scattered calcific plaque formation is demonstrated in the carotid bifurcation. Normal homogeneous color flow with normal waveforms. RIGHT VERTEBRAL ARTERY:  Antegrade flow direction. LEFT CAROTID ARTERY: No significant calcific plaque formation. Normal homogeneous flow on color flow Doppler imaging. Normal waveforms. Note of low bifurcation of the carotid just superior to the clavicle. LEFT VERTEBRAL ARTERY:  Antegrade flow direction. IMPRESSION: No evidence of hemodynamically significant stenosis of either right or the left internal carotid artery. Electronically Signed   By: Lucienne Capers M.D.   On: 08/08/2020 04:05     LOS: 0 days   Antonieta Pert, MD [facility]  08/08/2020, 2:26 PM

## 2020-08-08 NOTE — ED Notes (Signed)
US in room 

## 2020-08-08 NOTE — Progress Notes (Signed)
*  PRELIMINARY RESULTS* Echocardiogram 2D Echocardiogram has been performed.  [name] 08/08/2020, 9:59 AM

## 2020-08-09 ENCOUNTER — Inpatient Hospital Stay: Payer: Medicare Other | Admitting: Oncology

## 2020-08-09 ENCOUNTER — Inpatient Hospital Stay: Payer: Medicare Other

## 2020-08-09 ENCOUNTER — Telehealth: Payer: Self-pay | Admitting: Cardiovascular Disease

## 2020-08-09 DIAGNOSIS — G43809 Other migraine, not intractable, without status migrainosus: Secondary | ICD-10-CM | POA: Diagnosis not present

## 2020-08-09 LAB — CBC
HCT: 36.7 % (ref 36.0–46.0)
Hemoglobin: 11.3 g/dL — ABNORMAL LOW (ref 12.0–15.0)
MCH: 24.8 pg — ABNORMAL LOW (ref 26.0–34.0)
MCHC: 30.8 g/dL (ref 30.0–36.0)
MCV: 80.7 fL (ref 80.0–100.0)
Platelets: 218 10*3/uL (ref 150–400)
RBC: 4.55 MIL/uL (ref 3.87–5.11)
RDW: 13.3 % (ref 11.5–15.5)
WBC: 9.5 10*3/uL (ref 4.0–10.5)
nRBC: 0 % (ref 0.0–0.2)

## 2020-08-09 LAB — BASIC METABOLIC PANEL
Anion gap: 8 (ref 5–15)
BUN: 22 mg/dL — ABNORMAL HIGH (ref 6–20)
CO2: 24 mmol/L (ref 22–32)
Calcium: 9 mg/dL (ref 8.9–10.3)
Chloride: 105 mmol/L (ref 98–111)
Creatinine, Ser: 1.69 mg/dL — ABNORMAL HIGH (ref 0.44–1.00)
GFR, Estimated: 36 mL/min — ABNORMAL LOW (ref >60)
Glucose, Bld: 128 mg/dL — ABNORMAL HIGH (ref 70–99)
Potassium: 5 mmol/L (ref 3.5–5.1)
Sodium: 137 mmol/L (ref 135–145)

## 2020-08-09 MED ORDER — HYDRALAZINE HCL 25 MG PO TABS
25.0000 mg | ORAL_TABLET | Freq: Four times a day (QID) | ORAL | Status: DC | PRN
  Administered 2020-08-09: 17:00:00 25 mg via ORAL
  Filled 2020-08-09 (×2): qty 1

## 2020-08-09 MED ORDER — SODIUM CHLORIDE 0.9 % IV SOLN
1.0000 g | INTRAVENOUS | Status: DC
  Filled 2020-08-09: qty 10

## 2020-08-09 MED ORDER — AMLODIPINE BESYLATE 5 MG PO TABS
2.5000 mg | ORAL_TABLET | Freq: Every day | ORAL | Status: DC
  Administered 2020-08-09: 17:00:00 2.5 mg via ORAL
  Filled 2020-08-09: qty 1

## 2020-08-09 MED ORDER — FOSFOMYCIN TROMETHAMINE 3 G PO PACK
3.0000 g | PACK | Freq: Once | ORAL | Status: AC
  Administered 2020-08-09: 3 g via ORAL
  Filled 2020-08-09: qty 3

## 2020-08-09 NOTE — Telephone Encounter (Signed)
Pt c/o BP issue: STAT if pt c/o blurred vision, one-sided weakness or slurred speech  1. What are your last 5 BP readings? 157/89  2. Are you having any other symptoms (ex. Dizziness, headache, blurred vision, passed out)? Yes to all  3. What is your BP issue? No  Patient currently in hospital

## 2020-08-09 NOTE — Progress Notes (Signed)
Went through discharge paper with patient, verbalize understanding. Pt left POV with her son

## 2020-08-09 NOTE — TOC Initial Note (Signed)
Transition of Care [facility]) - Initial/Assessment Note    Patient Details  Name: [name] MRN: [id number] Date of Birth: 1970/12/01  Transition of Care [facility]) CM/SW Contact:    Shelbie Hutching, RN Phone Number: 08/09/2020, 10:37 AM  Clinical Narrative:                 Patient placed under observation for stroke like symptoms.  Patient is medically cleared for discharge home today with home health PT.  Patient is from home with her son.  Son can come and pick her up today when he gets off work at 70.  [facility].  [facility] can see patient on 8/13 for services.  Patient has walker and cane and 3 in 1 at home.   Patient is current with PCP and uses [facility] for prescriptions but will be transitioning to pill pack.   Expected Discharge Plan: [place] Barriers to Discharge: Barriers Resolved   Patient Goals and CMS Choice Patient states their goals for this hospitalization and ongoing recovery are:: Glad to be going home today CMS Medicare.gov Compare Post Acute Care list provided to:: Patient Choice offered to / list presented to : Patient  Expected Discharge Plan and Services Expected Discharge Plan: [place]   Discharge Planning Services: CM Consult Post Acute Care Choice: [place] arrangements for the past 2 months: Single Family Home Expected Discharge Date: 08/09/20               DME Arranged: N/A DME Agency: NA       HH Arranged: PT HH Agency: [place] ([place]) Date [place]: 08/09/20 Time [place]: 1036 Representative spoke with at [place]: Corene Cornea  Prior Living Arrangements/Services Living arrangements for the past 2 months: [place] Lives with:: Adult Children Patient language and need for interpreter reviewed:: Yes Do you feel safe going back to the place where you live?: Yes      Need for Family Participation in Patient  Care: Yes (Comment) Care giver support system in place?: Yes (comment) (son) Current home services: DME (walker, cane, 3 in 1) Criminal Activity/Legal Involvement Pertinent to Current Situation/Hospitalization: No - Comment as needed  Activities of Daily Living Home Assistive Devices/Equipment: Cane (specify quad or straight), Shower chair with back, Environmental consultant (specify type), Bedside commode/3-in-1 ADL Screening (condition at time of admission) Patient's cognitive ability adequate to safely complete daily activities?: Yes Is the patient deaf or have difficulty hearing?: No Does the patient have difficulty seeing, even when wearing glasses/contacts?: No Does the patient have difficulty concentrating, remembering, or making decisions?: No Patient able to express need for assistance with ADLs?: Yes Does the patient have difficulty dressing or bathing?: No Independently performs ADLs?: Yes (appropriate for developmental age) Does the patient have difficulty walking or climbing stairs?: Yes Weakness of Legs: Both Weakness of Arms/Hands: None  Permission Sought/Granted Permission sought to share information with : Case Manager, Other (comment) Permission granted to share information with : Yes, Verbal Permission Granted     Permission granted to share info w AGENCY: [facility]        Emotional Assessment Appearance:: Appears older than stated age Attitude/Demeanor/Rapport: Engaged Affect (typically observed): Accepting Orientation: : Oriented to Self, Oriented to Place, Oriented to  Time, Oriented to Situation Alcohol / Substance Use: Not Applicable Psych Involvement: No (comment)  Admission diagnosis:  Aphasia [R47.01] CVA (cerebral vascular accident) (  [place]) [I63.9] Numbness in both legs [R20.0] Weakness of both lower extremities [R29.898] Nonintractable headache, unspecified chronicity pattern, unspecified headache type [R51.9] Patient Active Problem List   Diagnosis Date Noted   CVA  (cerebral vascular accident) ([place]) 08/07/2020   Aphasia    Weakness of both lower extremities    Sepsis secondary to UTI ([place]) 04/23/2020   Hyperkalemia 04/23/2020   Unspecified atrial fibrillation ([place]) 04/23/2020   Family history of malignant neoplasm of gastrointestinal tract    Iron deficiency anemia secondary to blood loss (chronic)    Acute on chronic anemia 03/07/2020   AKI (acute kidney injury) ([place]) 03/07/2020   Upper GI bleed    Near syncope 01/25/2020   Diabetes mellitus ([place]) 01/25/2020   Dehydration 01/25/2020   Acute upper GI bleed 01/25/2020   Pain in limb 01/05/2020   Pulmonary embolism ([place]) 11/07/2019   Chest pain 11/05/2019   Type II diabetes mellitus with renal manifestations ([place]) 11/05/2019   Acute renal failure superimposed on stage 3a chronic kidney disease ([place]) 09/02/2019   Hypoglycemia    Family history of breast cancer 05/23/2019   Family history of colon cancer 05/23/2019   Retinopathy 05/12/2019   Abnormal LFTs 04/18/2019   Esophageal dysphagia    Gastrointestinal tract imaging abnormality    Trochanteric bursitis, left hip 03/17/2019   Malignant neoplasm of vulva, unspecified ([place]) 03/10/2019   Peripheral vascular disease, unspecified ([place]) 03/10/2019   Paroxysmal supraventricular tachycardia ([place]) 03/10/2019   Contusion of left hip 03/06/2019   Chest pain, moderate coronary artery risk 01/27/2019   Melena    Recurrent chest pain 01/19/2019   Depression, major, single episode, moderate ([place]) 12/09/2018   Closed nondisplaced intertrochanteric fracture of left femur ([place]) 10/12/2018   Hypotension 06/26/2018   Autonomic neuropathy 03/24/2018   Acute delirium 03/03/2018   H/O gastric bypass 03/02/2018   Hypertension associated with stage 3 chronic kidney disease due to type 2 diabetes mellitus ([place]) 02/20/2018   SI (sacroiliac) joint dysfunction 12/02/2017   Unstable angina ([place]) 06/24/2017   Syncope 04/09/2017   Insomnia 03/18/2017   Ischemic  cardiomyopathy    Arthritis    Anxiety    Tendinitis of upper biceps tendon of right shoulder 03/16/2017   Gastroenteritis 01/27/2017   Hx of colonic polyps    H/O medication noncompliance 12/14/2015   CAD (coronary artery disease)    Hypertensive heart disease    Status post bariatric surgery 06/04/2015   Carotid stenosis 04/30/2015   Stable angina pectoris ([place]) 04/17/2015   Iron deficiency anemia 03/22/2015   Vitamin B12 deficiency 02/18/2015   Misuse of medications for pain 02/18/2015   Major depressive disorder, recurrent, severe with psychotic features ([place]) 62/37/6283   Helicobacter pylori infection 11/23/2014   Malignant melanoma ([place]) 08/25/2014   Incomplete bladder emptying 07/12/2014   Hypothyroidism 12/30/2013   Aberrant subclavian artery 11/17/2013   Multiple sclerosis ([place]) 11/02/2013   History of CVA with residual deficit 06/20/2013   Headache, migraine 05/29/2013   Hyperlipidemia    GERD (gastroesophageal reflux disease)    Neuropathy ([place]) 01/02/2011   Stroke ([place]) 06/21/2008   Depression with anxiety 05/01/2008   Essential hypertension 05/01/2008   PCP:  [name], MD Pharmacy:   [place] ([place]), [place] - [address] [place] [address] Phone: [phone] Fax: [place] ([place]), [place] [address] [place] ([place]) [place] [address] Phone: [phone] Fax:  [phone]  [id number] [place], [place] [address] [place] [address] Phone: [phone] Fax: [phone]  [place], [place] [address] [place] [address] Phone: [phone] Fax: [phone]     Social Determinants of Health (SDOH) Interventions    Readmission Risk Interventions Readmission Risk Prevention Plan 11/07/2019 10/18/2019 09/05/2019  Transportation Screening Complete Complete  Complete  Medication Review Press photographer) Complete Complete Complete  PCP or Specialist appointment within 3-5 days of discharge (No Data) Complete Complete  HRI or Home Care Consult Complete Complete -  SW Recovery Care/Counseling Consult - Complete Complete  SW Consult Not Complete Comments - - -  Palliative Care Screening Not Applicable Not Applicable Not [place] Not Applicable Not Applicable Not Applicable  Some recent data might be hidden

## 2020-08-09 NOTE — Plan of Care (Signed)
Per chart review, headache resolved and patient to be dsicharged today. Neurology to sign off, but please re-engage if additional questions arise.  Su Monks, MD [facility] [phone]  If 7pm- 7am, please page neurology on call as listed in [place].

## 2020-08-09 NOTE — Telephone Encounter (Signed)
Called the patient. She is currently admitted to [facility]. Adv the pt that we are not able to manage her care from the office while she is admitted. Adv her that she will need to discuss her concerns and symptoms with the nurse and physician that are taking care of her.  Patient sts that she will. Adv the patient that I will fwd an FYI update to Dr. [name] and his nurse.

## 2020-08-09 NOTE — Discharge Summary (Signed)
Physician Discharge Summary  [place] HYW:[id number] DOB: 12-22-1970 DOA: 08/07/2020  PCP: [name], MD  Admit date: 08/07/2020 Discharge date: 08/09/2020  Admitted From: home Disposition:  home  Recommendations for Outpatient Follow-up:  Follow up with PCP in 1-2 weeks and with urology in a week.  Home Health:yes  Equipment/Devices: no  Discharge Condition: Stable Code Status:   Code Status: Full Code Diet recommendation:  Diet Order             Diet - low sodium heart healthy           Diet Heart Room service appropriate? Yes; Fluid consistency: Thin  Diet effective now                    Brief/Interim Summary: 50 year old female with history of recurrent CVA on Plavix, PAF not on anticoagulation secondary to CHF.  In the past, hypertension, UTI on suppressive antibiotics, CAD status post stenting, anemia, migraine, HLD, CKD stage III, anxiety/depression, recurrent UTI came to the ED for a speech problem and right leg weakness. As per the report patient had mild flareup 3 days ago with right-sided "pounding leg headache", for which she took Fioricet with little help.  No vision changes. Same day, family noticed her speech became slurred. She felt her talking was slow and tongue sluggish.  She thought the symptoms might be related to her headache and did not pay attention, and symptoms improved next day.   7/6- she developed right-sided lower extremity weakness and numbness.  Last month, she fell on her right sided and broke her right wrist.  She has been taking narcotics found to few weeks ago, but currently she does not take any narcotics.  She has had multiple UTIs this year and went to see her urologist last month was started on suppressive Macrobid.  She denied any history of back pain or lumbar spine problems. In ED-CT head negative exam showed decreased muscle strength in the right lower extremities. Patient was admitted for CVA versus complex migraine,  headache, pyuria-WBC more than 50, nitrite positive, leukocytes large, urine culture was sent.  Patient was treated with antibiotics urine culture grew E. Coli.  Neurology was consulted-felt that her right leg weakness and dysarthria with negative MRI brain for acute stroke likely recrudescence of prior stroke symptoms contributed to given possible UTI. Patient remains a stable feels all right no more headache, agreed for discharge home today with home health PT.  Discharge Diagnoses:   Severe right-sided migraine headache with right leg weakness and dysarthria consistent with prior history of complex migraine with MRI brain negative for acute infarct, possible recrudescence of prior stroke symptoms  2/2 1 UTI.  Patient had stroke work-up with A1c 5.3 LDL 121, patient is already on Crestor and Plavix.  Seen by PT OT.Status post Reglan, Decadron.  Roselyn Meier or Nurtec for abortive therapy.  E. coli UTI treated with antibiotics.  She has a history of ESBL, she otherwise denies any kind of urinary symptoms and fever chills given concern regarding UTI exacerbating her symptoms as #1 we will give fosfomycin x1, she will continue her Macrobid home dose.  Previously she had Enterococcus and ESBL E. coli sensitive to nitrofurantoin.  She is going to follow-up with her urology soon upon discharge within a week to discuss further.  She does intermittent self catheterization at home 4 times a day hence  possibility of bacterial colonization.   CKD stage IIIb Baseline creatinine 1.6-1.7 stable.  PAF in sinus rhythm not on anticoagulation due to severe GI bleed in the past Hypertension-poorly controlled, continue home meds Anxiety/depression continue SSRI, Namenda CAD cath in 11/21 stable. Hypothyroidism continue Synthroid  Consults: Neurology  Subjective: Feels better headache better, feels okay for home today has been ambulating with walker at baseline.  Discharge Exam: Vitals:   08/09/20 0437 08/09/20  0815  BP: (!) 144/78 (!) 162/74  Pulse: 67 75  Resp: 17 16  Temp: 98 F (36.7 C) 98 F (36.7 C)  SpO2: 97% 100%   General: Pt is alert, awake, not in acute distress Cardiovascular: RRR, S1/S2 +, no rubs, no gallops Respiratory: CTA bilaterally, no wheezing, no rhonchi Abdominal: Soft, NT, ND, bowel sounds + Extremities: no edema, no cyanosis  Discharge Instructions  Discharge Instructions     Diet - low sodium heart healthy   Complete by: As directed    Discharge instructions   Complete by: As directed    Please follow-up with your urologist within few days preferably within a week. Follow-up with your PCP/neurology.  Please call call MD or return to ER for similar or worsening recurring problem that brought you to hospital or if any fever,nausea/vomiting,abdominal pain, uncontrolled pain, chest pain,  shortness of breath or any other alarming symptoms.  Please follow-up your doctor as instructed in a week time and call the office for appointment.  Please avoid alcohol, smoking, or any other illicit substance and maintain healthy habits including taking your regular medications as prescribed.  You were cared for by a hospitalist during your hospital stay. If you have any questions about your discharge medications or the care you received while you were in the hospital after you are discharged, you can call the unit and ask to speak with the hospitalist on call if the hospitalist that took care of you is not available.  Once you are discharged, your primary care physician will handle any further medical issues. Please note that NO REFILLS for any discharge medications will be authorized once you are discharged, as it is imperative that you return to your primary care physician (or establish a relationship with a primary care physician if you do not have one) for your aftercare needs so that they can reassess your need for medications and monitor your lab values   Increase activity  slowly   Complete by: As directed       Allergies as of 08/09/2020       Reactions   Lipitor [atorvastatin] Other (See Comments)   Leg pains        Medication List     TAKE these medications    alosetron 1 MG tablet Commonly known as: Lotronex Take 1 tablet (1 mg total) by mouth 2 (two) times daily.   amLODipine 2.5 MG tablet Commonly known as: NORVASC Take 2.5 mg by mouth daily.   AZO TABS PO Take 2 tablets by mouth daily.   Baqsimi One Pack 3 MG/DOSE Powd Generic drug: Glucagon Place 1 spray into both nostrils as directed.   buPROPion 100 MG 12 hr tablet Commonly known as: WELLBUTRIN SR Take 100 mg by mouth 2 (two) times daily.   butalbital-acetaminophen-caffeine 50-325-40 MG tablet Commonly known as: FIORICET Take 1 tablet by mouth every 6 (six) hours as needed for headache.   carvedilol 12.5 MG tablet Commonly known as: COREG Take 1 tablet (12.5 mg total) by mouth 2 (two) times daily.   clopidogrel 75 MG tablet Commonly known as: PLAVIX Take 75 mg  by mouth daily.   Desvenlafaxine ER 50 MG Tb24 Commonly known as: PRISTIQ Take 1 tablet (50 mg total) by mouth every morning.   diphenoxylate-atropine 2.5-0.025 MG tablet Commonly known as: Lomotil Take 1 tablet by mouth 4 (four) times daily as needed for diarrhea or loose stools.   estradiol 0.1 MG/GM vaginal cream Commonly known as: ESTRACE Estrogen Cream Instruction Discard applicator Apply pea sized amount to tip of finger to urethra before bed. Wash hands well after application. Use Monday, Wednesday and Friday   gabapentin 300 MG capsule Commonly known as: NEURONTIN Take 2 capsules (600 mg total) by mouth 2 (two) times daily.   glucose 4 GM chewable tablet Chew 1 tablet (4 g total) by mouth 3 (three) times daily.   isosorbide mononitrate 120 MG 24 hr tablet Commonly known as: IMDUR Take 1 tablet (120 mg total) by mouth daily.   memantine 5 MG tablet Commonly known as: NAMENDA Take 5 mg by  mouth 2 (two) times daily.   nitrofurantoin (macrocrystal-monohydrate) 100 MG capsule Commonly known as: MACROBID Take 1 capsule (100 mg total) by mouth daily. For 3 months.   nitroGLYCERIN 0.4 MG SL tablet Commonly known as: NITROSTAT Place 1 tablet (0.4 mg total) under the tongue every 5 (five) minutes as needed for chest pain.   promethazine 25 MG tablet Commonly known as: PHENERGAN Take 1 tablet (25 mg total) by mouth every 6 (six) hours as needed for nausea or vomiting.   rosuvastatin 40 MG tablet Commonly known as: CRESTOR Take 1 tablet (40 mg total) by mouth daily at 6 PM.   SYSTANE OP Place 1 drop into both eyes daily as needed (dry eyes).   traMADol 50 MG tablet Commonly known as: ULTRAM Take 50 mg by mouth every 6 (six) hours as needed.   traZODone 100 MG tablet Commonly known as: DESYREL Take 1-2 tablets (100-200 mg total) by mouth at bedtime.   Ubrelvy 100 MG Tabs Generic drug: Ubrogepant Take 100 mg by mouth as needed (migraine).       ASK your doctor about these medications    ALPRAZolam 1 MG tablet Commonly known as: XANAX TAKE 1 TABLET BY MOUTH TWICE DAILY   diclofenac Sodium 1 % Gel Commonly known as: VOLTAREN SMARTSIG:2 Gram(s) Topical 3 Times Daily   famotidine 20 MG tablet Commonly known as: PEPCID Take 20 mg by mouth 2 (two) times daily.   levothyroxine 25 MCG tablet Commonly known as: SYNTHROID Take 1 tablet by mouth once daily before breakfast        Allergies  Allergen Reactions   Lipitor [Atorvastatin] Other (See Comments)    Leg pains    The results of significant diagnostics from this hospitalization (including imaging, microbiology, ancillary and laboratory) are listed below for reference.    Microbiology: Recent Results (from the past 240 hour(s))  Resp Panel by RT-PCR (Flu A&B, Covid) Nasopharyngeal Swab     Status: None   Collection Time: 08/07/20  3:03 PM   Specimen: Nasopharyngeal Swab; Nasopharyngeal(NP) swabs in  vial transport medium  Result Value Ref Range Status   SARS Coronavirus 2 by RT PCR NEGATIVE NEGATIVE Final    Comment: (NOTE) SARS-CoV-2 target nucleic acids are NOT DETECTED.  The SARS-CoV-2 RNA is generally detectable in upper respiratory specimens during the acute phase of infection. The lowest concentration of SARS-CoV-2 viral copies this assay can detect is 138 copies/mL. A negative result does not preclude SARS-Cov-2 infection and should not be used as the sole basis  for treatment or other patient management decisions. A negative result may occur with  improper specimen collection/handling, submission of specimen other than nasopharyngeal swab, presence of viral mutation(s) within the areas targeted by this assay, and inadequate number of viral copies(<138 copies/mL). A negative result must be combined with clinical observations, patient history, and epidemiological information. The expected result is Negative.  Fact Sheet for Patients:  EntrepreneurPulse.com.au  Fact Sheet for Healthcare Providers:  IncredibleEmployment.be  This test is no t yet approved or cleared by the [place] FDA and  has been authorized for detection and/or diagnosis of SARS-CoV-2 by FDA under an Emergency Use Authorization (EUA). This EUA will remain  in effect (meaning this test can be used) for the duration of the COVID-19 declaration under Section 564(b)(1) of the Act, 21 U.S.C.section 360bbb-3(b)(1), unless the authorization is terminated  or revoked sooner.       Influenza A by PCR NEGATIVE NEGATIVE Final   Influenza B by PCR NEGATIVE NEGATIVE Final    Comment: (NOTE) The Xpert Xpress SARS-CoV-2/FLU/RSV plus assay is intended as an aid in the diagnosis of influenza from Nasopharyngeal swab specimens and should not be used as a sole basis for treatment. Nasal washings and aspirates are unacceptable for Xpert Xpress SARS-CoV-2/FLU/RSV testing.  Fact  Sheet for Patients: EntrepreneurPulse.com.au  Fact Sheet for Healthcare Providers: IncredibleEmployment.be  This test is not yet approved or cleared by the [place] FDA and has been authorized for detection and/or diagnosis of SARS-CoV-2 by FDA under an Emergency Use Authorization (EUA). This EUA will remain in effect (meaning this test can be used) for the duration of the COVID-19 declaration under Section 564(b)(1) of the Act, 21 U.S.C. section 360bbb-3(b)(1), unless the authorization is terminated or revoked.  Performed at [facility], [address] [place], [place] [address]   Urine culture     Status: Abnormal (Preliminary result)   Collection Time: 08/07/20  4:20 PM   Specimen: Urine, Random  Result Value Ref Range Status   Specimen Description   Final    URINE, RANDOM Performed at [facility], [name] [place], [place] [address]    Special Requests   Final    NONE Performed at [facility], [address] [place], [place] [address]    Culture (A)  Final    >=100,000 COLONIES/mL ESCHERICHIA COLI SUSCEPTIBILITIES TO FOLLOW Performed at [facility], [place] [address], [place], [place] [address]    Report Status PENDING  Incomplete    Procedures/Studies: DG Knee 2 Views Right  Result Date: 08/08/2020 CLINICAL DATA:  Acute right knee pain. EXAM: RIGHT KNEE - 1-2 VIEW COMPARISON:  None. FINDINGS: No evidence of fracture, dislocation, or joint effusion. No evidence of arthropathy or other focal bone abnormality. Soft tissues are unremarkable. IMPRESSION: Negative. Electronically Signed   By: Marijo Conception M.D.   On: 08/08/2020 08:32   CT Head Wo Contrast  Result Date: 08/07/2020 CLINICAL DATA:  Neuro deficit, acute stroke suspected. EXAM: CT HEAD WITHOUT CONTRAST TECHNIQUE: Contiguous axial images were obtained from the base of the skull through the vertex without intravenous contrast.  COMPARISON:  CT head 06/17/2020 FINDINGS: Brain: Patchy and confluent areas of decreased attenuation are noted throughout the deep and periventricular white matter of the cerebral hemispheres bilaterally, compatible with chronic microvascular ischemic disease. Similar-appearing right basilar lacunar infarction. No evidence of large-territorial acute infarction. No parenchymal hemorrhage. No mass lesion. No extra-axial collection. No mass effect or midline shift. No  hydrocephalus. Basilar cisterns are patent. Vascular: No hyperdense vessel. Atherosclerotic calcifications are present within the cavernous internal carotid and vertebral arteries. Skull: No acute fracture or focal lesion. Sinuses/Orbits: Paranasal sinuses and mastoid air cells are clear. Bilateral lens replacement. Otherwise the orbits are unremarkable. Other: None. IMPRESSION: No acute intracranial abnormality. Electronically Signed   By: Iven Finn M.D.   On: 08/07/2020 15:23   MR BRAIN WO CONTRAST  Result Date: 08/07/2020 CLINICAL DATA:  Right-sided headache, difficulty speaking, memory disturbance, right leg weakness, and decreased sensation in both legs. EXAM: MRI HEAD WITHOUT CONTRAST TECHNIQUE: Multiplanar, multiecho pulse sequences of the brain and surrounding structures were obtained without intravenous contrast. COMPARISON:  Head CT 08/07/2020 and MRI 11/05/2019 FINDINGS: Brain: There is no evidence of an acute infarct, mass, midline shift, or extra-axial fluid collection. A few scattered chronic cerebral microhemorrhages are again noted in a nonspecific pattern. Patchy and confluent T2 hyperintensities throughout the cerebral white matter and pons have not significantly changed from the prior MRI and are nonspecific but compatible with severe chronic small vessel ischemic disease. Chronic lacunar infarcts are again seen in the pons, bilateral basal ganglia, thalami, and deep cerebral white matter. There also numerous dilated  perivascular spaces in the basal ganglia. The ventricles are normal in size. Vascular: Major intracranial vascular flow voids are preserved. Skull and upper cervical spine: Unremarkable bone marrow signal. Sinuses/Orbits: Bilateral cataract extraction. Paranasal sinuses and mastoid air cells are clear. Other: None. IMPRESSION: 1. No acute intracranial abnormality. 2. Severe chronic small vessel ischemic disease with multiple chronic lacunar infarcts. Electronically Signed   By: Logan Bores M.D.   On: 08/07/2020 20:25   US Carotid Bilateral (at [facility] and [facility] only)  Result Date: 08/08/2020 CLINICAL DATA:  CVA. Hypertension. Stroke/TIA. Prior coronary artery disease. Hyperlipidemia. Diabetes. Vascular surgery. EXAM: BILATERAL CAROTID DUPLEX ULTRASOUND TECHNIQUE: Pearline Cables scale imaging, color Doppler and duplex ultrasound were performed of bilateral carotid and vertebral arteries in the neck. COMPARISON:  CT neck 09/20/2019 FINDINGS: Criteria: Quantification of carotid stenosis is based on velocity parameters that correlate the residual internal carotid diameter with NASCET-based stenosis levels, using the diameter of the distal internal carotid lumen as the denominator for stenosis measurement. The following velocity measurements were obtained: RIGHT ICA: 54/20 cm/sec CCA: 16/60 cm/sec SYSTOLIC ICA/CCA RATIO:  1.2 ECA: 52 cm/sec LEFT ICA: 52/15 cm/sec CCA: 630/1 cm/sec SYSTOLIC ICA/CCA RATIO:  0.5 ECA: 58 cm/sec RIGHT CAROTID ARTERY: Mild scattered calcific plaque formation is demonstrated in the carotid bifurcation. Normal homogeneous color flow with normal waveforms. RIGHT VERTEBRAL ARTERY:  Antegrade flow direction. LEFT CAROTID ARTERY: No significant calcific plaque formation. Normal homogeneous flow on color flow Doppler imaging. Normal waveforms. Note of low bifurcation of the carotid just superior to the clavicle. LEFT VERTEBRAL ARTERY:  Antegrade flow direction. IMPRESSION: No evidence of hemodynamically  significant stenosis of either right or the left internal carotid artery. Electronically Signed   By: Lucienne Capers M.D.   On: 08/08/2020 04:05   ECHOCARDIOGRAM COMPLETE  Result Date: 08/08/2020    ECHOCARDIOGRAM REPORT   Patient Name:   Blanchard Date of Exam: 08/08/2020 Medical Rec #:  [id number]             Height:       63.0 in Accession #:    [id number]            Weight:       145.0 lb Date of Birth:  1971-01-13  BSA:          1.687 m Patient Age:    50 years              BP:           195/101 mmHg Patient Gender: F                     HR:           60 bpm. Exam Location:  [facility] Procedure: 2D Echo, Cardiac Doppler and Color Doppler Indications:     Stroke I63.9  History:         Patient has prior history of Echocardiogram examinations, most                  recent 04/23/2020. Previous Myocardial Infarction; Risk                  Factors:Hypertension. Aortic arch aneurysm.  Sonographer:     Sherrie Sport RDCS (AE) Referring Phys:  [id number] Lequita Halt Diagnosing Phys: Ida Rogue MD  Sonographer Comments: Suboptimal apical window and suboptimal parasternal window. IMPRESSIONS  1. Left ventricular ejection fraction, by estimation, is 60 to 65%. The left ventricle has normal function. The left ventricle has no regional wall motion abnormalities. Left ventricular diastolic parameters are consistent with Grade I diastolic dysfunction (impaired relaxation).  2. Right ventricular systolic function is normal. The right ventricular size is normal. There is normal pulmonary artery systolic pressure. The estimated right ventricular systolic pressure is 17.4 mmHg.  3. The mitral valve is normal in structure. No evidence of mitral valve regurgitation. No evidence of mitral stenosis. FINDINGS  Left Ventricle: Left ventricular ejection fraction, by estimation, is 60 to 65%. The left ventricle has normal function. The left ventricle has no regional wall motion abnormalities. The left ventricular  internal cavity size was normal in size. There is  no left ventricular hypertrophy. Left ventricular diastolic parameters are consistent with Grade I diastolic dysfunction (impaired relaxation). Right Ventricle: The right ventricular size is normal. No increase in right ventricular wall thickness. Right ventricular systolic function is normal. There is normal pulmonary artery systolic pressure. The tricuspid regurgitant velocity is 1.79 m/s, and  with an assumed right atrial pressure of 5 mmHg, the estimated right ventricular systolic pressure is 08.1 mmHg. Left Atrium: Left atrial size was normal in size. Right Atrium: Right atrial size was normal in size. Pericardium: There is no evidence of pericardial effusion. Mitral Valve: The mitral valve is normal in structure. No evidence of mitral valve regurgitation. No evidence of mitral valve stenosis. Tricuspid Valve: The tricuspid valve is normal in structure. Tricuspid valve regurgitation is not demonstrated. No evidence of tricuspid stenosis. Aortic Valve: The aortic valve is normal in structure. Aortic valve regurgitation is not visualized. Mild to moderate aortic valve sclerosis/calcification is present, without any evidence of aortic stenosis. Aortic valve mean gradient measures 5.0 mmHg. Aortic valve peak gradient measures 8.3 mmHg. Aortic valve area, by VTI measures 2.57 cm. Pulmonic Valve: The pulmonic valve was normal in structure. Pulmonic valve regurgitation is not visualized. No evidence of pulmonic stenosis. Aorta: The aortic root is normal in size and structure. Venous: The inferior vena cava is normal in size with greater than 50% respiratory variability, suggesting right atrial pressure of 3 mmHg. IAS/Shunts: No atrial level shunt detected by color flow Doppler.  LEFT VENTRICLE PLAX 2D LVIDd:         4.52 cm  Diastology LVIDs:  3.20 cm  LV e' medial:    6.42 cm/s LV PW:         1.37 cm  LV E/e' medial:  10.0 LV IVS:        0.76 cm  LV e'  lateral:   9.57 cm/s LVOT diam:     2.20 cm  LV E/e' lateral: 6.7 LV SV:         65 LV SV Index:   38 LVOT Area:     3.80 cm  RIGHT VENTRICLE RV Basal diam:  2.33 cm RV S prime:     15.80 cm/s TAPSE (M-mode): 4.1 cm LEFT ATRIUM             Index       RIGHT ATRIUM           Index LA diam:        3.70 cm 2.19 cm/m  RA Area:     13.60 cm LA Vol (A2C):   24.1 ml 14.29 ml/m RA Volume:   33.30 ml  19.74 ml/m LA Vol (A4C):   46.7 ml 27.69 ml/m LA Biplane Vol: 34.9 ml 20.69 ml/m  AORTIC VALVE                    PULMONIC VALVE AV Area (Vmax):    1.89 cm     PV Vmax:        0.96 m/s AV Area (Vmean):   2.10 cm     PV Peak grad:   3.6 mmHg AV Area (VTI):     2.57 cm     RVOT Peak grad: 4 mmHg AV Vmax:           144.33 cm/s AV Vmean:          101.533 cm/s AV VTI:            0.251 m AV Peak Grad:      8.3 mmHg AV Mean Grad:      5.0 mmHg LVOT Vmax:         71.80 cm/s LVOT Vmean:        56.100 cm/s LVOT VTI:          0.170 m LVOT/AV VTI ratio: 0.68  AORTA Ao Root diam: 3.20 cm MITRAL VALVE               TRICUSPID VALVE MV Area (PHT): 4.44 cm    TR Peak grad:   12.8 mmHg MV Decel Time: 171 msec    TR Vmax:        179.00 cm/s MV E velocity: 64.30 cm/s MV A velocity: 96.00 cm/s  SHUNTS MV E/A ratio:  0.67        Systemic VTI:  0.17 m                            Systemic Diam: 2.20 cm Ida Rogue MD Electronically signed by Ida Rogue MD Signature Date/Time: 08/08/2020/4:22:37 PM    Final     Labs: BNP (last 3 results) Recent Labs    10/17/19 2040 11/05/19 1247 01/09/20 0859  BNP 130.3* 24.9 51.8   Basic Metabolic Panel: Recent Labs  Lab 08/07/20 1213 08/09/20 0455  NA 136 137  K 4.8 5.0  CL 103 105  CO2 24 24  GLUCOSE 90 128*  BUN 17 22*  CREATININE 1.76* 1.69*  CALCIUM 8.8* 9.0   Liver Function Tests: No results for input(s): AST,  ALT, ALKPHOS, BILITOT, PROT, ALBUMIN in the last 168 hours. No results for input(s): LIPASE, AMYLASE in the last 168 hours. No results for input(s): AMMONIA in  the last 168 hours. CBC: Recent Labs  Lab 08/07/20 1213 08/09/20 0455  WBC 6.1 9.5  HGB 10.5* 11.3*  HCT 34.2* 36.7  MCV 81.8 80.7  PLT 197 218   Cardiac Enzymes: No results for input(s): CKTOTAL, CKMB, CKMBINDEX, TROPONINI in the last 168 hours. BNP: Invalid input(s): POCBNP CBG: No results for input(s): GLUCAP in the last 168 hours. D-Dimer No results for input(s): DDIMER in the last 72 hours. Hgb A1c Recent Labs    08/08/20 0601  HGBA1C 5.3   Lipid Profile Recent Labs    08/08/20 0601  CHOL 180  HDL 35*  LDLCALC 121*  TRIG 118  CHOLHDL 5.1   Thyroid function studies No results for input(s): TSH, T4TOTAL, T3FREE, THYROIDAB in the last 72 hours.  Invalid input(s): FREET3 Anemia work up No results for input(s): VITAMINB12, FOLATE, FERRITIN, TIBC, IRON, RETICCTPCT in the last 72 hours. Urinalysis    Component Value Date/Time   COLORURINE YELLOW (A) 08/07/2020 1620   APPEARANCEUR TURBID (A) 08/07/2020 1620   APPEARANCEUR Hazy (A) 07/04/2020 0952   LABSPEC 1.006 08/07/2020 1620   LABSPEC 1.012 12/29/2013 1210   PHURINE 5.0 08/07/2020 1620   GLUCOSEU NEGATIVE 08/07/2020 1620   GLUCOSEU >=500 12/29/2013 1210   HGBUR SMALL (A) 08/07/2020 1620   HGBUR negative 04/14/2010 1435   BILIRUBINUR NEGATIVE 08/07/2020 1620   BILIRUBINUR Negative 07/04/2020 0952   BILIRUBINUR Negative 12/29/2013 1210   KETONESUR NEGATIVE 08/07/2020 1620   PROTEINUR 30 (A) 08/07/2020 1620   UROBILINOGEN 0.2 04/18/2020 1326   UROBILINOGEN negative 04/14/2010 1435   NITRITE POSITIVE (A) 08/07/2020 1620   LEUKOCYTESUR LARGE (A) 08/07/2020 1620   LEUKOCYTESUR Negative 12/29/2013 1210   Sepsis Labs Invalid input(s): PROCALCITONIN,  WBC,  LACTICIDVEN Microbiology Recent Results (from the past 240 hour(s))  Resp Panel by RT-PCR (Flu A&B, Covid) Nasopharyngeal Swab     Status: None   Collection Time: 08/07/20  3:03 PM   Specimen: Nasopharyngeal Swab; Nasopharyngeal(NP) swabs in vial  transport medium  Result Value Ref Range Status   SARS Coronavirus 2 by RT PCR NEGATIVE NEGATIVE Final    Comment: (NOTE) SARS-CoV-2 target nucleic acids are NOT DETECTED.  The SARS-CoV-2 RNA is generally detectable in upper respiratory specimens during the acute phase of infection. The lowest concentration of SARS-CoV-2 viral copies this assay can detect is 138 copies/mL. A negative result does not preclude SARS-Cov-2 infection and should not be used as the sole basis for treatment or other patient management decisions. A negative result may occur with  improper specimen collection/handling, submission of specimen other than nasopharyngeal swab, presence of viral mutation(s) within the areas targeted by this assay, and inadequate number of viral copies(<138 copies/mL). A negative result must be combined with clinical observations, patient history, and epidemiological information. The expected result is Negative.  Fact Sheet for Patients:  EntrepreneurPulse.com.au  Fact Sheet for Healthcare Providers:  IncredibleEmployment.be  This test is no t yet approved or cleared by the [place] FDA and  has been authorized for detection and/or diagnosis of SARS-CoV-2 by FDA under an Emergency Use Authorization (EUA). This EUA will remain  in effect (meaning this test can be used) for the duration of the COVID-19 declaration under Section 564(b)(1) of the Act, 21 U.S.C.section 360bbb-3(b)(1), unless the authorization is terminated  or revoked sooner.  Influenza A by PCR NEGATIVE NEGATIVE Final   Influenza B by PCR NEGATIVE NEGATIVE Final    Comment: (NOTE) The Xpert Xpress SARS-CoV-2/FLU/RSV plus assay is intended as an aid in the diagnosis of influenza from Nasopharyngeal swab specimens and should not be used as a sole basis for treatment. Nasal washings and aspirates are unacceptable for Xpert Xpress SARS-CoV-2/FLU/RSV testing.  Fact  Sheet for Patients: EntrepreneurPulse.com.au  Fact Sheet for Healthcare Providers: IncredibleEmployment.be  This test is not yet approved or cleared by the [place] FDA and has been authorized for detection and/or diagnosis of SARS-CoV-2 by FDA under an Emergency Use Authorization (EUA). This EUA will remain in effect (meaning this test can be used) for the duration of the COVID-19 declaration under Section 564(b)(1) of the Act, 21 U.S.C. section 360bbb-3(b)(1), unless the authorization is terminated or revoked.  Performed at [facility], [address] [place], [place] [address]   Urine culture     Status: Abnormal (Preliminary result)   Collection Time: 08/07/20  4:20 PM   Specimen: Urine, Random  Result Value Ref Range Status   Specimen Description   Final    URINE, RANDOM Performed at [facility], [address] [place], [place] [address]    Special Requests   Final    NONE Performed at [facility], [address] [place], [place] [address]    Culture (A)  Final    >=100,000 COLONIES/mL ESCHERICHIA COLI SUSCEPTIBILITIES TO FOLLOW Performed at [facility], [place] [address], [place],  [address]    Report Status PENDING  Incomplete     Time coordinating discharge: 25 minutes  SIGNED: Antonieta Pert, MD  [facility] 08/09/2020, 10:01 AM  If 7PM-7AM, please contact night-coverage [email or web address]

## 2020-08-09 NOTE — TOC Progression Note (Signed)
Transition of Care [facility]) - Progression Note    Patient Details  Name: [name] MRN: [id number] Date of Birth: 1970/08/17  Transition of Care [facility]) CM/SW Contact  Shelbie Hutching, RN Phone Number: 08/09/2020, 12:54 PM  Clinical Narrative:    Patient had a question about transportation back and forth to her appointments if she was eligible for this.  RNCM instructed patient to contact [employer] as many of their advantage plans include medical transportation.  If her plan does not include medical transport then [employer] ([place]) can provide transportation for a [employer].     Expected Discharge Plan: [place] Barriers to Discharge: Barriers Resolved  Expected Discharge Plan and Services Expected Discharge Plan: [place]   Discharge Planning Services: CM Consult Post Acute Care Choice: [place] arrangements for the past 2 months: Single Family Home Expected Discharge Date: 08/09/20               DME Arranged: N/A DME Agency: NA       HH Arranged: PT HH Agency: [place] ([place]) Date HH Agency Contacted: 08/09/20 Time [place]: [phone] Representative spoke with at [place]: [place] ([place]) Interventions    Readmission Risk Interventions Readmission Risk Prevention Plan 11/07/2019 10/18/2019 09/05/2019  Transportation Screening Complete Complete Complete  Medication Review Press photographer) Complete Complete Complete  PCP or Specialist appointment within 3-5 days of discharge (No Data) Complete Complete  HRI or Home Care Consult Complete Complete -  SW Recovery Care/Counseling Consult - Complete Complete  SW Consult Not Complete Comments - - -  Palliative Care Screening Not Applicable Not Applicable Not [place] Not Applicable Not Applicable Not Applicable  Some recent data might be hidden

## 2020-08-10 ENCOUNTER — Other Ambulatory Visit: Payer: Self-pay | Admitting: Internal Medicine

## 2020-08-10 LAB — URINE CULTURE: Culture: >=100000 — AB

## 2020-08-10 MED ORDER — CEPHALEXIN 500 MG PO CAPS: 500.0000 mg | ORAL_CAPSULE | Freq: Four times a day (QID) | ORAL | 0 refills | Status: AC

## 2020-08-10 NOTE — Progress Notes (Signed)
Reviewed-URINE CX  w/ E coli sensitive to keflex, sent new rx and asked to stop macrobid and also advised to see her urology. Patient instructed over the phone.

## 2020-08-19 ENCOUNTER — Telehealth: Payer: Self-pay | Admitting: *Deleted

## 2020-08-19 NOTE — Chronic Care Management (AMB) (Signed)
  Chronic Care Management   Note  08/19/2020 Name: [name] MRN: [id number] DOB: 1970-08-12  [name] is a 50 y.o. year old female who is a primary care patient of Fisher, Kirstie Peri, MD. I reached out to [facility] by phone today in response to a referral sent by [name]'s PCP, Dr. Caryn Section      [name] was given information about Chronic Care Management services today including:  CCM service includes personalized support from designated clinical staff supervised by her physician, including individualized plan of care and coordination with other care providers 24/7 contact phone numbers for assistance for urgent and routine care needs. Service will only be billed when office clinical staff spend 20 minutes or more in a month to coordinate care. Only one practitioner may furnish and bill the service in a calendar month. The patient may stop CCM services at any time (effective at the end of the month) by phone call to the office staff. The patient will be responsible for cost sharing (co-pay) of up to 20% of the service fee (after annual deductible is met).  Patient agreed to services and verbal consent obtained.   Follow up plan: Telephone appointment with care management team member scheduled for:08/23/2020  [place] Management

## 2020-08-20 ENCOUNTER — Inpatient Hospital Stay: Payer: Medicare Other | Attending: Oncology

## 2020-08-20 ENCOUNTER — Telehealth: Payer: Self-pay

## 2020-08-20 DIAGNOSIS — D509 Iron deficiency anemia, unspecified: Secondary | ICD-10-CM | POA: Diagnosis present

## 2020-08-20 LAB — CBC WITH DIFFERENTIAL/PLATELET
Abs Immature Granulocytes: 0.02 10*3/uL (ref 0.00–0.07)
Basophils Absolute: 0.1 10*3/uL (ref 0.0–0.1)
Basophils Relative: 1 %
Eosinophils Absolute: 0.2 10*3/uL (ref 0.0–0.5)
Eosinophils Relative: 3 %
HCT: 34.6 % — ABNORMAL LOW (ref 36.0–46.0)
Hemoglobin: 10.8 g/dL — ABNORMAL LOW (ref 12.0–15.0)
Immature Granulocytes: 0 %
Lymphocytes Relative: 23 %
Lymphs Abs: 1.8 10*3/uL (ref 0.7–4.0)
MCH: 24.8 pg — ABNORMAL LOW (ref 26.0–34.0)
MCHC: 31.2 g/dL (ref 30.0–36.0)
MCV: 79.5 fL — ABNORMAL LOW (ref 80.0–100.0)
Monocytes Absolute: 0.6 10*3/uL (ref 0.1–1.0)
Monocytes Relative: 7 %
Neutro Abs: 5.4 10*3/uL (ref 1.7–7.7)
Neutrophils Relative %: 66 %
Platelets: 174 10*3/uL (ref 150–400)
RBC: 4.35 MIL/uL (ref 3.87–5.11)
RDW: 14 % (ref 11.5–15.5)
WBC: 8.1 10*3/uL (ref 4.0–10.5)
nRBC: 0 % (ref 0.0–0.2)

## 2020-08-20 LAB — IRON AND TIBC
Iron: 34 ug/dL (ref 28–170)
Saturation Ratios: 9 % — ABNORMAL LOW (ref 10.4–31.8)
TIBC: 363 ug/dL (ref 250–450)
UIBC: 329 ug/dL

## 2020-08-20 LAB — FERRITIN: Ferritin: 11 ng/mL (ref 11–307)

## 2020-08-20 NOTE — Progress Notes (Signed)
Chronic Care Management Pharmacy Assistant   Name: [name]  MRN: [id number] DOB: Nov 24, 1970  Chart review for Clinical pharmacist on 08/23/2020.  Conditions to be addressed/monitored: Atrial Fibrillation, CAD, HTN, HLD, DMII, CKD Stage 3, Anxiety, Depression, and Aberrant Subclavian artey, Migraine, Ischemic cardiomyopathy, Hypotension,Perpheral vascular disease, paroxysmal supraventricular tachycardia, GERD, Hypothyroidism,Neuropathy, Multiple sclerosis, Arthritis,Malignant neoplasm of Vulva, Vitamin B-12 deficiency, iron deficiency anemia,Insomnia.  Primary concerns for visit include: N/A   Recent office visits:  06/19/2020 Dr. Caryn Section MD (PCP) Start butalbital-acetaminophen-caffeine [facility]) [phone] MG tablet PRN,stop  isometheptene-acetaminophen-dichloralphenazone (MIDRIN) [phone] MG , stop Zofran 06/17/2020 Dr. Caryn Section MD (PCP)Order stat CT Head Wo Contrast,start isometheptene-acetaminophen-dichloralphenazone (MIDRIN) [phone] MG capsule; Take 1-2 capsules by mouth every 8 (eight) hours as needed for headache 04/18/2020 Laverna Peace FNP (PCP office) Start doxycycline 100 MG tablet; Take 1 tablet (100 mg total) by mouth 2 (two) times daily, Start terconazole 0.4 % vaginal cream; Place 1 applicator vaginally at bedtime for 5 days, Start metroNIDAZOLE  0.75 % vaginal gel; Place 1 Applicatorful vaginally daily for 5 days 03/18/2020 Dr. Caryn Section MD  (PCP)- change to doxycycline 100mg  twice a day for 7 days 03/13/2020 Dr. Caryn Section MD (PCP) start cephALEXin  500 MG capsule; Take 1 capsule (500 mg total) by mouth 4 (four) times daily for 7 days, fluconazole 150 MG tablet; Take 1 tablet (150 mg total) by mouth once for 1 dose  Recent consult visits:  08/11/2020 Dr.Sninsky MD (Urology)  You do not need to continue the Keflex (Cephalexin) 07/24/2020  Dr.Lateef MD (Nephrology)No Medication Change noted 07/06/2020 Dr. Diamantina Providence MD (Urology) recommend  AZO for the next 1-2 days  to help with the burning 07/04/2020 Dr. Diamantina Providence MD (Urology) Cystoscopy procedure 06/24/2020 Damaris Hippo PA (Orthopedic surgery) start tramadol 50 mg 1 tablet PRN 06/05/2020 Gayland Curry (Neurology)place another referral for the speech therapy 05/27/2020 Dr. Diamantina Providence MD (Urology)  Start Diflucan 150 mg 1 tablet daily 05/23/2020 Dr. Diamantina Providence MD (Urology) start Macrobid 100 mg twice daily x10 days,may use Pyridium (AZO) for dysuria over the counter,stop Nitrofurantoin 500 mg 05/22/2020 Gayland Curry PA (Neurology)No Medication Changes Noted 05/21/2020 Dr. [name] MD (Cardiology)No Medication Changes Noted 05/20/2020 Debroah Loop (Urology) start  fosfomycin (MONUROL) 3 g PACK; Take 3 g by mouth once for 1 dose.  Dispense: 3 g; Refill: 0 05/10/2020 Gayland Curry PA (Neurology) Ajovy denied, patient need to start Aimovig 05/08/2020 Dr. Diamantina Providence MD (Urology) Start Nitrofurantoin 500 mg twice daily x5 days, then transition to 90 days of 100 mg daily prophylaxi,Use estrogen cream 3 times per week by applying a pea-sized amount to the opening of the urethra(tube that you pee through) before beds,take cranberry tablets 1-2 times daily 05/07/2020 Dr.WohI MD (Gastroenterology) No Medication Changes Noted. 05/03/2020 Caroline More DPM (Podiatry) Recommend usage of Voltaren gel 05/03/2020 Dr. Manuella Ghazi MD (Neurology) Start Namenda (Memantine). Please take 5 mg by mouth at night for 1 week, then increase to 5 mg by mouth two times a day,discontinue Amitriptyline, Home Health Referral for Speech Therapy was place,Continue Ubrelvy as needed 05/02/2020 Dr.Klein MD (Cardiology)  discontinue her amlodipine and decrease her carvedilol from 25--12.5 04/16/2020 Iron deficiency - Infusion 04/16/2020 Dr. Grayland Ormond MD (Oncology) No Medication changes noted. 04/08/2020 Nada Maclachlan [facility] Cardiac Pulmonary Rehab) No Medications Changes noted 04/03/2020  Mat Carne RN (Endocrinology) No Medications Changes noted. 03/29/2020  Mat Carne RN (Endocrinology) No Medications Changes noted. 03/28/2020 Dr.Lateef MD (Nephrology)No Medication Changes noted 03/27/2020 Heath Lark RN [facility] Cardiac Pulmonary Rehab) No Medications Changes noted 03/24/2020 Dr.  Wohl MD (Gastroenterology) add back the Lomotil 4 times daily as before and the Lotronex 2 times daily 03/22/2020 Dr. Manuella Ghazi MD (Neurology) start fremanezumab-vfrm 225 mg/1.5 mL AtIn,Inject 225 mg every 28 days 03/17/2020 Dr. Allen Norris MD (Gastroenterology)stop Lomotil, start Lotronex 1 mg 1 tablet twice daily 03/15/2020 Mat Carne RN (Endocrinology) No Medication Changes noted 03/11/2020 Dr. Allen Norris MD (Gastroenterology) set up for a repeat colonoscopy 03/05/2020 Dr. Holley Raring MD (Nephrology) No Medication Changes Noted 03/05/2020 Mat Carne RN (Endocrinology) No Medications Changes noted. 03/01/2020 Gayland Curry PA (Neurology) Start Emgality 120 mg injector pen,Ask psychiatry if you could take amitriptyline 10 mg at night instead of trazodone 02/29/2020 [name] RN [facility] Cardiac Pulmonary Rehab) No Medications Changes noted 02/28/2020 Heath Lark RN [facility] Cardiac Pulmonary Rehab) No Medications Changes noted 02/27/2020 Heath Lark RN [facility] Cardiac Pulmonary Rehab) No Medications Changes noted. 02/26/2020 Dr.Gollan MD (Cardiology) No medication changes noted 02/23/2020 Mat Carne RN (Endocrinology) No Medication changes noted.  Hospital visits:  Medication Reconciliation was completed by comparing discharge summary, patient's EMR and Pharmacy list, and upon discussion with patient.  Admitted to the hospital on 08/07/2020 due to Aphasia. Discharge date was 08/09/2020. Discharged from [place]?Medications Started at [facility] Discharge:?? -started Cephalexin 500 mg 1 capsule 4 times daily for 7 day,start amlodipine 2.5 mg daily  Medication Changes at Hospital Discharge: -Changed None  Medications Discontinued at Hospital  Discharge: -Stopped Macrobid  Medications that remain the same after Hospital Discharge:??  -All other medications will remain the same.    Admitted to the hospital on 06/09/2020 due to Contusion of face. Discharge date was 06/09/2020. Discharged from [place]?Medications Started at [facility] Discharge:?? -started Hydrocodone- Acetaminophen 5-325 mg PRN, Zofran 4 mg PRN  Medication Changes at Hospital Discharge: -Changed None  Medications Discontinued at Hospital Discharge: -Stopped None  Medications that remain the same after Hospital Discharge:??  -All other medications will remain the same.    Admitted to the hospital on 04/23/2020 due to Urinary tract infection with hematuria and sepsis. Discharge date was 04/25/2020. Discharged from [place]?Medications Started at [facility] Discharge:?? -started None  Medication Changes at Hospital Discharge: -Changed None  Medications Discontinued at Hospital Discharge: -Stopped None  Medications that remain the same after Hospital Discharge:??  -All other medications will remain the same.    Admitted to the hospital on 03/31/2020 due to Fall. Discharge date was 03/31/2020. Discharged from [place]?Medications Started at [facility] Discharge:?? -started None  Medication Changes at Hospital Discharge: -Changed None  Medications Discontinued at Hospital Discharge: -Stopped None  Medications that remain the same after Hospital Discharge:??  -All other medications will remain the same.    Admitted to the hospital on 03/14/2020 due to Colonoscopy procedure Discharge date was 03/14/2020. Discharged from [place]?Medications Started at [facility] Discharge:?? -started None  Medication Changes at Hospital Discharge: -Changed None  Medications Discontinued at Hospital Discharge: -Stopped None  Medications that remain the same after  Hospital Discharge:??  -All other medications will remain the same.    Admitted to the hospital on 03/07/2020 due to Upper GI Bleed. Discharge date was 03/09/2020. Discharged from [place]?Medications Started at [facility] Discharge:?? -started None  Medication Changes at Hospital Discharge: -Changed None  Medications Discontinued at Hospital Discharge: -Stopped None  Medications that remain the same after Hospital Discharge:??  -All other medications will remain  the same.    Left Voice message to do initial question prior to patient appointment on 08/23/2020 for CCM at 8:00 am with Junius Argyle the Clinical pharmacist.   Medications: Outpatient Encounter Medications as of 08/20/2020  Medication Sig   alosetron (LOTRONEX) 1 MG tablet Take 1 tablet (1 mg total) by mouth 2 (two) times daily.   ALPRAZolam (XANAX) 1 MG tablet TAKE 1 TABLET BY MOUTH TWICE DAILY (Patient taking differently: Take 1 mg by mouth 2 (two) times daily.)   amLODipine (NORVASC) 2.5 MG tablet Take 2.5 mg by mouth daily.   BAQSIMI ONE PACK 3 MG/DOSE POWD Place 1 spray into both nostrils as directed.   buPROPion (WELLBUTRIN SR) 100 MG 12 hr tablet Take 100 mg by mouth 2 (two) times daily.   butalbital-acetaminophen-caffeine (FIORICET) 50-325-40 MG tablet Take 1 tablet by mouth every 6 (six) hours as needed for headache.   carvedilol (COREG) 12.5 MG tablet Take 1 tablet (12.5 mg total) by mouth 2 (two) times daily.   clopidogrel (PLAVIX) 75 MG tablet Take 75 mg by mouth daily.   Desvenlafaxine ER (PRISTIQ) 50 MG TB24 Take 1 tablet (50 mg total) by mouth every morning.   diclofenac Sodium (VOLTAREN) 1 % GEL SMARTSIG:2 Gram(s) Topical 3 Times Daily (Patient not taking: No sig reported)   diphenoxylate-atropine (LOMOTIL) 2.5-0.025 MG tablet Take 1 tablet by mouth 4 (four) times daily as needed for diarrhea or loose stools.   estradiol (ESTRACE) 0.1 MG/GM vaginal cream Estrogen Cream Instruction  Discard applicator Apply pea sized amount to tip of finger to urethra before bed. Wash hands well after application. Use Monday, Wednesday and Friday   famotidine (PEPCID) 20 MG tablet Take 20 mg by mouth 2 (two) times daily. (Patient not taking: Reported on 08/07/2020)   gabapentin (NEURONTIN) 300 MG capsule Take 2 capsules (600 mg total) by mouth 2 (two) times daily.   glucose 4 GM chewable tablet Chew 1 tablet (4 g total) by mouth 3 (three) times daily.   isosorbide mononitrate (IMDUR) 120 MG 24 hr tablet Take 1 tablet (120 mg total) by mouth daily.   levothyroxine (SYNTHROID) 25 MCG tablet Take 1 tablet by mouth once daily before breakfast (Patient taking differently: Take 25 mcg by mouth daily before breakfast.)   memantine (NAMENDA) 5 MG tablet Take 5 mg by mouth 2 (two) times daily.   nitrofurantoin, macrocrystal-monohydrate, (MACROBID) 100 MG capsule Take 1 capsule (100 mg total) by mouth daily. For 3 months.   nitroGLYCERIN (NITROSTAT) 0.4 MG SL tablet Place 1 tablet (0.4 mg total) under the tongue every 5 (five) minutes as needed for chest pain.   Phenazopyridine HCl (AZO TABS PO) Take 2 tablets by mouth daily.   Polyethyl Glycol-Propyl Glycol (SYSTANE OP) Place 1 drop into both eyes daily as needed (dry eyes).   promethazine (PHENERGAN) 25 MG tablet Take 1 tablet (25 mg total) by mouth every 6 (six) hours as needed for nausea or vomiting.   rosuvastatin (CRESTOR) 40 MG tablet Take 1 tablet (40 mg total) by mouth daily at 6 PM.   traMADol (ULTRAM) 50 MG tablet Take 50 mg by mouth every 6 (six) hours as needed.   traZODone (DESYREL) 100 MG tablet Take 1-2 tablets (100-200 mg total) by mouth at bedtime.   Ubrogepant (UBRELVY) 100 MG TABS Take 100 mg by mouth as needed (migraine).   No facility-administered encounter medications on file as of 08/20/2020.    Care Gaps: Shingrix Vaccine Pneumococcal Vaccine Urine Microalbumin COVID-19 Vaccine  Foot Exam Star Rating Drugs: Rosuvastatin 40  mg last filled on 01/19/2020 for 90 day supply at [facility]. Medication Fill Gaps: clopidogrel  75 MG last filled 12/22/2019 90 day supply.   [place] Pharmacist Assistant [phone]

## 2020-08-21 ENCOUNTER — Other Ambulatory Visit: Payer: Self-pay | Admitting: Family Medicine

## 2020-08-21 ENCOUNTER — Other Ambulatory Visit: Payer: Self-pay | Admitting: Urology

## 2020-08-21 ENCOUNTER — Other Ambulatory Visit: Payer: Self-pay | Admitting: Internal Medicine

## 2020-08-21 DIAGNOSIS — G4700 Insomnia, unspecified: Secondary | ICD-10-CM

## 2020-08-21 DIAGNOSIS — N390 Urinary tract infection, site not specified: Secondary | ICD-10-CM

## 2020-08-21 NOTE — Telephone Encounter (Signed)
Requested Prescriptions  Pending Prescriptions Disp Refills  . traZODone (DESYREL) 100 MG tablet [Pharmacy Med Name: Trazodone Hydrochloride 100mg  Tablet] 30 tablet 5    Sig: Take 1 to 2 tablets by mouth at bedtime     Psychiatry: Antidepressants - Serotonin Modulator Passed - 08/21/2020  2:03 AM      Passed - Completed PHQ-2 or PHQ-9 in the last 360 days      Passed - Valid encounter within last 6 months    Recent Outpatient Visits          2 months ago Somnolence   [facility] [name], MD   4 months ago [place], FNP   5 months ago Iron deficiency anemia due to chronic blood loss   [place], MD   6 months ago Anemia, unspecified type   [facility] [name], MD   7 months ago Dyspnea on exertion   [facility] [name], MD      Future Appointments            In 1 week Caryn Section, Kirstie Peri, MD [facility] [place]   In 2 months Deboraha Sprang, MD [facility], [place]

## 2020-08-22 ENCOUNTER — Inpatient Hospital Stay: Payer: Medicare Other

## 2020-08-22 ENCOUNTER — Other Ambulatory Visit: Payer: Self-pay

## 2020-08-22 ENCOUNTER — Encounter: Payer: Self-pay | Admitting: Hospice and Palliative Medicine

## 2020-08-22 ENCOUNTER — Inpatient Hospital Stay (HOSPITAL_BASED_OUTPATIENT_CLINIC_OR_DEPARTMENT_OTHER): Payer: Medicare Other | Admitting: Hospice and Palliative Medicine

## 2020-08-22 VITALS — BP 155/88 | HR 79 | Temp 97.1°F | Resp 20 | Wt 143.0 lb

## 2020-08-22 DIAGNOSIS — D508 Other iron deficiency anemias: Secondary | ICD-10-CM | POA: Diagnosis not present

## 2020-08-22 DIAGNOSIS — D509 Iron deficiency anemia, unspecified: Secondary | ICD-10-CM | POA: Diagnosis not present

## 2020-08-22 MED ORDER — SODIUM CHLORIDE 0.9 % IV SOLN
Freq: Once | INTRAVENOUS | Status: AC
  Filled 2020-08-22: qty 250

## 2020-08-22 MED ORDER — SODIUM CHLORIDE 0.9 % IV SOLN: 200.0000 mg | Freq: Once | INTRAVENOUS | Status: DC

## 2020-08-22 MED ORDER — IRON SUCROSE 20 MG/ML IV SOLN
200.0000 mg | Freq: Once | INTRAVENOUS | Status: AC
  Administered 2020-08-22: 200 mg via INTRAVENOUS
  Filled 2020-08-22: qty 10

## 2020-08-22 NOTE — Patient Instructions (Signed)
[facility]  Discharge Instructions: Thank you for choosing [facility] to provide your oncology and hematology care.  If you have a lab appointment with the Cancer Center, please go directly to the Cancer Center and check in at the registration area.  Wear comfortable clothing and clothing appropriate for easy access to any Portacath or PICC line.   We strive to give you quality time with your provider. You may need to reschedule your appointment if you arrive late (15 or more minutes).  Arriving late affects you and other patients whose appointments are after yours.  Also, if you miss three or more appointments without notifying the office, you may be dismissed from the clinic at the provider's discretion.      For prescription refill requests, have your pharmacy contact our office and allow 72 hours for refills to be completed.    Today you received the following : Venofer   To help prevent nausea and vomiting after your treatment, we encourage you to take your nausea medication as directed.  BELOW ARE SYMPTOMS THAT SHOULD BE REPORTED IMMEDIATELY: . *FEVER GREATER THAN 100.4 F (38 C) OR HIGHER . *CHILLS OR SWEATING . *NAUSEA AND VOMITING THAT IS NOT CONTROLLED WITH YOUR NAUSEA MEDICATION . *UNUSUAL SHORTNESS OF BREATH . *UNUSUAL BRUISING OR BLEEDING . *URINARY PROBLEMS (pain or burning when urinating, or frequent urination) . *BOWEL PROBLEMS (unusual diarrhea, constipation, pain near the anus) . TENDERNESS IN MOUTH AND THROAT WITH OR WITHOUT PRESENCE OF ULCERS (sore throat, sores in mouth, or a toothache) . UNUSUAL RASH, SWELLING OR PAIN  . UNUSUAL VAGINAL DISCHARGE OR ITCHING   Items with * indicate a potential emergency and should be followed up as soon as possible or go to the Emergency Department if any problems should occur.  Please show the CHEMOTHERAPY ALERT CARD or IMMUNOTHERAPY ALERT CARD at check-in to the Emergency  Department and triage nurse.  Should you have questions after your visit or need to cancel or reschedule your appointment, please contact [facility]  [phone] and follow the prompts.  Office hours are 8:00 a.m. to 4:30 p.m. Monday - Friday. Please note that voicemails left after 4:00 p.m. may not be returned until the following business day.  We are closed weekends and major holidays. You have access to a nurse at all times for urgent questions. Please call the main number to the clinic [phone] and follow the prompts.  For any non-urgent questions, you may also contact your provider using MyChart. We now offer e-Visits for anyone 18 and older to request care online for non-urgent symptoms. For details visit [email or web address].   Also download the MyChart app! Go to the app store, search "MyChart", open the app, select [facility], and log in with your MyChart username and password.  Due to Covid, a mask is required upon entering the hospital/clinic. If you do not have a mask, one will be given to you upon arrival. For doctor visits, patients may have 1 support person aged 18 or older with them. For treatment visits, patients cannot have anyone with them due to current Covid guidelines and our immunocompromised population.  

## 2020-08-22 NOTE — Progress Notes (Signed)
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 medial:  10.0 LV IVS:        0.76 cm  LV e' lateral:   9.57 cm/s LVOT diam:     2.20 cm  LV E/e' lateral: 6.7 LV SV:         65 LV SV Index:   38 LVOT Area:     3.80 cm  RIGHT VENTRICLE RV Basal diam:  2.33 cm RV S prime:     15.80 cm/s TAPSE (M-mode): 4.1 cm LEFT ATRIUM             Index       RIGHT ATRIUM           Index LA diam:        3.70 cm 2.19 cm/m  RA Area:     13.60 cm LA Vol (A2C):   24.1 ml 14.29 ml/m RA Volume:   33.30 ml  19.74 ml/m LA Vol (A4C):   46.7 ml 27.69 ml/m LA Biplane Vol: 34.9 ml 20.69 ml/m  AORTIC VALVE                    PULMONIC VALVE AV Area (Vmax):    1.89 cm     PV Vmax:        0.96 m/s AV Area (Vmean):   2.10 cm     PV Peak grad:   3.6 mmHg AV Area (VTI):     2.57 cm     RVOT Peak grad: 4 mmHg AV  Vmax:           144.33 cm/s AV Vmean:          101.533 cm/s AV VTI:            0.251 m AV Peak Grad:      8.3 mmHg AV Mean Grad:      5.0 mmHg LVOT Vmax:         71.80 cm/s LVOT Vmean:        56.100 cm/s LVOT VTI:          0.170 m LVOT/AV VTI ratio: 0.68  AORTA Ao Root diam: 3.20 cm MITRAL VALVE               TRICUSPID VALVE MV Area (PHT): 4.44 cm    TR Peak grad:   12.8 mmHg MV Decel Time: 171 msec    TR Vmax:        179.00 cm/s MV E velocity: 64.30 cm/s MV A velocity: 96.00 cm/s  SHUNTS MV E/A ratio:  0.67        Systemic VTI:  0.17 m                            Systemic Diam: 2.20 cm Ida Rogue MD Electronically signed by Ida Rogue MD Signature Date/Time: 08/08/2020/4:22:37 PM    Final      ASSESSMENT: Iron deficiency anemia.  PLAN:    1. Iron deficiency anemia: Her hemoglobin was 11.3 on 7/8 and is 10.8 today.  Iron and ferritin are WNL but saturation ratio is decreased.  Discussed with Dr. Grayland Ormond and will proceed with Venofer 200 mg today and plan for 56-monthfollow-up to see NP/labs/possible Venofer.   2.  History of gastric ulcer: Patient does not report any active bleeding.  Continue follow-up with  GI as indicated. 3.  Chronic renal insufficiency: Patient's most recent creatinine on August 09, 2020 was 1.69, which is improved from baseline.  Continue to monitor.  Case and plan discussed with Dr. Grayland Ormond  I spent a total of 15 minutes reviewing chart data, face-to-face evaluation with the patient, counseling and coordination of care as detailed above.   Patient expressed understanding and was in agreement with this plan. She also understands that She can call clinic at any time with any questions, concerns, or complaints.   Irean Hong, NP   08/22/2020 2:12 PM

## 2020-08-23 ENCOUNTER — Ambulatory Visit (INDEPENDENT_AMBULATORY_CARE_PROVIDER_SITE_OTHER): Payer: Medicare Other

## 2020-08-23 DIAGNOSIS — G4700 Insomnia, unspecified: Secondary | ICD-10-CM

## 2020-08-23 DIAGNOSIS — F333 Major depressive disorder, recurrent, severe with psychotic symptoms: Secondary | ICD-10-CM

## 2020-08-23 DIAGNOSIS — I25118 Atherosclerotic heart disease of native coronary artery with other forms of angina pectoris: Secondary | ICD-10-CM

## 2020-08-23 DIAGNOSIS — E039 Hypothyroidism, unspecified: Secondary | ICD-10-CM | POA: Diagnosis not present

## 2020-08-23 DIAGNOSIS — E1122 Type 2 diabetes mellitus with diabetic chronic kidney disease: Secondary | ICD-10-CM

## 2020-08-23 DIAGNOSIS — E785 Hyperlipidemia, unspecified: Secondary | ICD-10-CM

## 2020-08-23 DIAGNOSIS — N1832 Chronic kidney disease, stage 3b: Secondary | ICD-10-CM

## 2020-08-23 DIAGNOSIS — E1159 Type 2 diabetes mellitus with other circulatory complications: Secondary | ICD-10-CM

## 2020-08-23 DIAGNOSIS — G629 Polyneuropathy, unspecified: Secondary | ICD-10-CM

## 2020-08-23 DIAGNOSIS — E1169 Type 2 diabetes mellitus with other specified complication: Secondary | ICD-10-CM

## 2020-08-23 DIAGNOSIS — F419 Anxiety disorder, unspecified: Secondary | ICD-10-CM

## 2020-08-23 DIAGNOSIS — I152 Hypertension secondary to endocrine disorders: Secondary | ICD-10-CM

## 2020-08-23 DIAGNOSIS — I6930 Unspecified sequelae of cerebral infarction: Secondary | ICD-10-CM

## 2020-08-23 NOTE — Patient Instructions (Signed)
Visit Information It was great speaking with you today!  Please let me know if you have any questions about our visit.   Goals Addressed             This Visit's Progress    Track and Manage My Blood Pressure-Hypertension       Timeframe:  Long-Range Goal Priority:  High Start Date:  08/23/2020                            Expected End Date:  08/23/2021                     Follow Up Date 10/01/2020    - check blood pressure 3 times per week    Why is this important?   You won't feel high blood pressure, but it can still hurt your blood vessels.  High blood pressure can cause heart or kidney problems. It can also cause a stroke.  Making lifestyle changes like losing a little weight or eating less salt will help.  Checking your blood pressure at home and at different times of the day can help to control blood pressure.  If the doctor prescribes medicine remember to take it the way the doctor ordered.  Call the office if you cannot afford the medicine or if there are questions about it.     Notes:         Patient Care Plan: General Pharmacy (Adult)     Problem Identified: Hypertension, Hyperlipidemia, Diabetes, Coronary Artery Disease, GERD, Chronic Kidney Disease, Hypothyroidism, Depression, Anxiety, and Insomnia, and History of CVA   Priority: High     Long-Range Goal: Patient-Specific Goal   Start Date: 08/23/2020  Expected End Date: 02/23/2021  This Visit's Progress: On track  Priority: High  Note:   Current Barriers:  Unable to achieve control of blood pressure  Unable to achieve control of cholesterol  Unable to achieve control of anxiety  Unable to achieve control of insomnia  Pharmacist Clinical Goal(s):  Patient will achieve control of blood pressure as evidenced by BP less than 140/90  Patient will achieve control of cholesterol as evidenced by LDL less than 70 Patient will achieve control of anxiety as evidenced by patient report Patient will achieve control  of insomnia as evidenced by patient report through collaboration with PharmD and provider.   Interventions: 1:1 collaboration with [name], MD regarding development and update of comprehensive plan of care as evidenced by provider attestation and co-signature Inter-disciplinary care team collaboration (see longitudinal plan of care) Comprehensive medication review performed; medication list updated in electronic medical record  Hypertension (BP goal <130/80) -Not ideally controlled -History of labile blood pressure, syncope  -Current treatment: Amlodipine 2.5 mg daily  Carvedilol 12.5 mg twice daily  Imdur 120 mg daily  -Medications previously tried: Clonidine, Diltiazem, enalapril   -Current home readings: 134/80 -Reports hypotensive symptoms: feels like she is going to pass out, once  -Does report chest pain at rest. Rates severity as 8/10, (10/10 is severe enough to require NG) -Educated on Daily salt intake goal < 2300 mg; Importance of home blood pressure monitoring; -Counseled to monitor BP at home 2-3 times weekly, document, and provide log at future appointments -Counseled to increase water intake to 2 bottles daily  -Recommended increasing amlodipine to 5 mg daily   Hyperlipidemia: (LDL goal < 70) -Uncontrolled -History of TIA, CVA -History of GI Bleed -Current treatment: Rosuvastatin  40 mg daily  -Current treatment: Clopidogrel 75 mg daily  -Medications previously tried: Atorvastatin, Ezetimibe, Fenofibrate,   -Patient endorses 100% adherence to rosuvastatin, but not filled since Dec 2021 at [facility] 140 mg SQ every 14 days  Diabetes (A1c goal <7%) -Controlled -Current hyperglycemic medications: None -Current hypoglycemic medications: Baqsimi 1 spray as needed for severe hypoglycemia  Glucose tablets three times daily as needed for hypoglycemia  -Medications previously tried: NA  -Recommended to continue current  medication  Depression/Anxiety (Goal: Maintain stable mood) -Uncontrolled -Current treatment: Alprazolam 1 mg twice daily  Bupropion SR 100 mg twice daily  Desvenlafaxine ER 50 mg daily  Memantine 5 mg twice daily  Trazodone 100 mg 2 tablets nightly  -Medications previously tried/failed: Aripiprazole, brexpiprazole, cariprazine, citalopram, escitalopram, Lunesta, lithium, quetiapine, temazepam, venlafaxine XR, zolpidem,  -PHQ9: 24 (12/21) -GAD7: 21 (07/21) -Was previously connected to psychiatry, but felt they were only interested in adjusting medications and wants a new referral for counseling. Patient referred to [facility] behavioral [facility].  -Sleeping 5 hours nightly. Trouble falling asleep, wakes up once nightly, takes ~30 minutes to fall back asleep. Trazodone has provided some relief.  -Anxiety symptoms worsened recently, patient reports recent stressors include trying to buy the home she is currently living in.  -Bupropion not recommended in anxiety, patient does report it has helped some with her depression.  -Recommended cross taper off bupropion, starting Buspar 5 mg twice daily.   Hypothyroidism (Goal: Maintain stable thyroid function) -Controlled -Current treatment  Levothyroxine 25 mcg daily  -Medications previously tried: NA  -Recommended to continue current medication  Chronic Pain (Goal: Maintain pain relief) -Controlled -Current treatment  Diclofeanc 1% gel  Gabapentin 600 mg twice daily  Tramadol 50 mg every 6 hours as needed (Uses ~twice weekly)  -Medications previously tried: NA  -Denies symptoms of CNS depression  -Recommended to continue current medication  Chronic Kidney Disease Stage 3b  -All medications assessed for renal dosing and appropriateness in chronic kidney disease. -Rosuvastatin decrease to 10 mg daily if CrCL < 30 mL/min. Patient with intolerances to atorvastatin.  -Gabapentin recommended maintenance dose 300 mg three times daily. Patient  denies symptoms of CNS depression. -Recommended to continue current medication  Patient Goals/Self-Care Activities Patient will:  - check blood pressure 2-3 times weekly, document, and provide at future appointments  Follow Up Plan: Telephone follow up appointment with care management team member scheduled for:  11/15/2020 at 9:00 AM       [name] was given information about Chronic Care Management services today including:  CCM service includes personalized support from designated clinical staff supervised by her physician, including individualized plan of care and coordination with other care providers 24/7 contact phone numbers for assistance for urgent and routine care needs. Standard insurance, coinsurance, copays and deductibles apply for chronic care management only during months in which we provide at least 20 minutes of these services. Most insurances cover these services at 100%, however patients may be responsible for any copay, coinsurance and/or deductible if applicable. This service may help you avoid the need for more expensive face-to-face services. Only one practitioner may furnish and bill the service in a calendar month. The patient may stop CCM services at any time (effective at the end of the month) by phone call to the office staff.  Patient agreed to services and verbal consent obtained.   Patient verbalizes understanding of instructions provided today and agrees to view in [place].   Junius Argyle, PharmD, BCACP, CPP  [place] [phone]

## 2020-08-23 NOTE — Progress Notes (Signed)
Chronic Care Management Pharmacy Note  08/23/2020 Name:  [name] MRN:  [id number] DOB:  April 26, 1970  Summary: Patient presents today for initial CCM consult. She reports she is doing better since her recent hospitalization. She is concerned about her elevated blood pressure and reports her anxiety and sleep have been worse recently. Patient endorses 100% adherence to rosuvastatin, but not filled since Dec 2021 at [facility] She gets most of her medications filled through [facility].   Recommendations/Changes made from today's visit: Recommended increasing amlodipine to 5 mg daily  Recommended Repatha 140 mg SQ every 14 days Recommended cross taper off bupropion, starting Buspar 5 mg twice daily.  Patient referred to behavioral health  Plan: CPP Follow-up in 3 months   Subjective: [name] is an 50 y.o. year old female who is a primary patient of Fisher, Kirstie Peri, MD.  The CCM team was consulted for assistance with disease management and care coordination needs.    Engaged with patient by telephone for initial visit in response to provider referral for pharmacy case management and/or care coordination services.   Consent to Services:  The patient was given the following information about Chronic Care Management services today, agreed to services, and gave verbal consent: 1. CCM service includes personalized support from designated clinical staff supervised by the primary care provider, including individualized plan of care and coordination with other care providers 2. 24/7 contact phone numbers for assistance for urgent and routine care needs. 3. Service will only be billed when office clinical staff spend 20 minutes or more in a month to coordinate care. 4. Only one practitioner may furnish and bill the service in a calendar month. 5.The patient may stop CCM services at any time (effective at the end of the month) by phone call to the office staff. 6. The  patient will be responsible for cost sharing (co-pay) of up to 20% of the service fee (after annual deductible is met). Patient agreed to services and consent obtained.  Patient Care Team: [name], MD as PCP - General (Family Medicine) [name] Kathlene November, MD as PCP - Cardiology (Cardiology) Vladimir Crofts, MD as Consulting Physician (Neurology) Anthonette Legato, MD as Consulting Physician (Nephrology) Lloyd Huger, MD as Consulting Physician (Oncology) Poggi, Marshall Cork, MD as Consulting Physician (Orthopedic Surgery) Warnell Forester, NP as Nurse Practitioner (Endocrinology) Caroline More, DPM as Consulting Physician (Podiatry) Copland, Ginette Otto as Referring Physician (Obstetrics and Gynecology) Danice Goltz, MD as Consulting Physician (Ophthalmology) Carloyn Manner, MD as Referring Physician (Otolaryngology) Germaine Pomfret, [facility] (Pharmacist)  Recent office visits: 06/19/2020 Dr. Caryn Section MD (PCP) Start butalbital-acetaminophen-caffeine [facility]) [phone] MG tablet PRN,stop  isometheptene-acetaminophen-dichloralphenazone (MIDRIN) [phone] MG , stop Zofran 06/17/2020 Dr. Caryn Section MD (PCP)Order stat CT Head Wo Contrast,start isometheptene-acetaminophen-dichloralphenazone (MIDRIN) [phone] MG capsule; Take 1-2 capsules by mouth every 8 (eight) hours as needed for headache 04/18/2020 Laverna Peace FNP (PCP office) Start doxycycline 100 MG tablet; Take 1 tablet (100 mg total) by mouth 2 (two) times daily, Start terconazole 0.4 % vaginal cream; Place 1 applicator vaginally at bedtime for 5 days, Start metroNIDAZOLE  0.75 % vaginal gel; Place 1 Applicatorful vaginally daily for 5 days 03/18/2020 Dr. Caryn Section MD  (PCP)- change to doxycycline 1102m twice a day for 7 days 03/13/2020 Dr. FCaryn SectionMD (PCP) start cephALEXin  500 MG capsule; Take 1 capsule (500 mg total) by mouth 4 (four) times daily for 7 days, fluconazole 150 MG tablet; Take 1 tablet (  150 mg total) by  mouth once for 1 dose  Recent consult visits: 08/11/2020 Dr.Sninsky MD (Urology)  You do not need to continue the Keflex (Cephalexin) 07/24/2020  Dr.Lateef MD (Nephrology)No Medication Change noted 07/06/2020 Dr. Diamantina Providence MD (Urology) recommend  AZO for the next 1-2 days to help with the burning 07/04/2020 Dr. Diamantina Providence MD (Urology) Cystoscopy procedure 06/24/2020 Damaris Hippo PA (Orthopedic surgery) start tramadol 50 mg 1 tablet PRN 06/05/2020 Gayland Curry (Neurology)place another referral for the speech therapy 05/27/2020 Dr. Diamantina Providence MD (Urology)  Start Diflucan 150 mg 1 tablet daily 05/23/2020 Dr. Diamantina Providence MD (Urology) start Macrobid 100 mg twice daily x10 days,may use Pyridium (AZO) for dysuria over the counter,stop Nitrofurantoin 500 mg 05/22/2020 Gayland Curry PA (Neurology)No Medication Changes Noted 05/21/2020 Dr. [name] MD (Cardiology)No Medication Changes Noted 05/20/2020 Debroah Loop (Urology) start  fosfomycin (MONUROL) 3 g PACK; Take 3 g by mouth once for 1 dose.  Dispense: 3 g; Refill: 0 05/10/2020 Gayland Curry PA (Neurology) Ajovy denied, patient need to start Aimovig 05/08/2020 Dr. Diamantina Providence MD (Urology) Start Nitrofurantoin 500 mg twice daily x5 days, then transition to 90 days of 100 mg daily prophylaxi,Use estrogen cream 3 times per week by applying a pea-sized amount to the opening of the urethra(tube that you pee through) before beds,take cranberry tablets 1-2 times daily 05/07/2020 Dr.WohI MD (Gastroenterology) No Medication Changes Noted. 05/03/2020 Caroline More DPM (Podiatry) Recommend usage of Voltaren gel 05/03/2020 Dr. Manuella Ghazi MD (Neurology) Start Namenda (Memantine). Please take 5 mg by mouth at night for 1 week, then increase to 5 mg by mouth two times a day,discontinue Amitriptyline, Home Health Referral for Speech Therapy was place,Continue Ubrelvy as needed 05/02/2020 Dr.Klein MD (Cardiology)  discontinue her amlodipine and decrease her carvedilol from  25--12.5 04/16/2020 Iron deficiency - Infusion 04/16/2020 Dr. Grayland Ormond MD (Oncology) No Medication changes noted. 04/08/2020 Nada Maclachlan [facility] Cardiac Pulmonary Rehab) No Medications Changes noted 04/03/2020  Mat Carne RN (Endocrinology) No Medications Changes noted. 03/29/2020 Mat Carne RN (Endocrinology) No Medications Changes noted. 03/28/2020 Dr.Lateef MD (Nephrology)No Medication Changes noted 03/27/2020 Heath Lark RN [facility] Cardiac Pulmonary Rehab) No Medications Changes noted 03/24/2020 Dr. Allen Norris MD (Gastroenterology) add back the Lomotil 4 times daily as before and the Lotronex 2 times daily 03/22/2020 Dr. Manuella Ghazi MD (Neurology) start fremanezumab-vfrm 225 mg/1.5 mL AtIn,Inject 225 mg every 28 days 03/17/2020 Dr. Allen Norris MD (Gastroenterology)stop Lomotil, start Lotronex 1 mg 1 tablet twice daily 03/15/2020 Mat Carne RN (Endocrinology) No Medication Changes noted 03/11/2020 Dr. Allen Norris MD (Gastroenterology) set up for a repeat colonoscopy 03/05/2020 Dr. Holley Raring MD (Nephrology) No Medication Changes Noted 03/05/2020 Mat Carne RN (Endocrinology) No Medications Changes noted. 03/01/2020 Gayland Curry PA (Neurology) Start Emgality 120 mg injector pen,Ask psychiatry if you could take amitriptyline 10 mg at night instead of trazodone 02/29/2020 [name] RN [facility] Cardiac Pulmonary Rehab) No Medications Changes noted 02/28/2020 Heath Lark RN [facility] Cardiac Pulmonary Rehab) No Medications Changes noted 02/27/2020 Heath Lark RN [facility] Cardiac Pulmonary Rehab) No Medications Changes noted. 02/26/2020 Dr.Gollan MD (Cardiology) No medication changes noted 02/23/2020 Mat Carne RN (Endocrinology) No Medication changes noted.  Hospital visits: Admitted to the hospital on 08/07/2020 due to Aphasia. Discharge date was 08/09/2020. Discharged from [place]?Medications Started at [facility] Discharge:?? -started Cephalexin 500 mg 1 capsule 4 times daily for 7  day,start amlodipine 2.5 mg daily   Medication Changes at Hospital Discharge: -Changed None   Medications Discontinued at Hospital Discharge: -Stopped Macrobid   Medications that remain the  same after Hospital Discharge:?? -All other medications will remain the same.     Admitted to the hospital on 06/09/2020 due to Contusion of face. Discharge date was 06/09/2020. Discharged from [place]?Medications Started at [facility] Discharge:?? -started Hydrocodone- Acetaminophen 5-325 mg PRN, Zofran 4 mg PRN   Medication Changes at Hospital Discharge: -Changed None   Medications Discontinued at Hospital Discharge: -Stopped None   Medications that remain the same after Hospital Discharge:?? -All other medications will remain the same.     Admitted to the hospital on 04/23/2020 due to Urinary tract infection with hematuria and sepsis. Discharge date was 04/25/2020. Discharged from [place]?Medications Started at [facility] Discharge:?? -started None   Medication Changes at Hospital Discharge: -Changed None   Medications Discontinued at Hospital Discharge: -Stopped None   Medications that remain the same after Hospital Discharge:?? -All other medications will remain the same.     Admitted to the hospital on 03/31/2020 due to Fall. Discharge date was 03/31/2020. Discharged from [place]?Medications Started at [facility] Discharge:?? -started None   Medication Changes at Hospital Discharge: -Changed None   Medications Discontinued at Hospital Discharge: -Stopped None   Medications that remain the same after Hospital Discharge:?? -All other medications will remain the same.     Admitted to the hospital on 03/14/2020 due to Colonoscopy procedure Discharge date was 03/14/2020. Discharged from [place]?Medications Started at [facility] Discharge:?? -started None   Medication  Changes at Hospital Discharge: -Changed None   Medications Discontinued at Hospital Discharge: -Stopped None   Medications that remain the same after Hospital Discharge:?? -All other medications will remain the same.     Admitted to the hospital on 03/07/2020 due to Upper GI Bleed. Discharge date was 03/09/2020. Discharged from [place]?Medications Started at [facility] Discharge:?? -started None   Medication Changes at Hospital Discharge: -Changed None   Medications Discontinued at Hospital Discharge: -Stopped None   Medications that remain the same after Hospital Discharge:?? -All other medications will remain the same.     Objective:  Lab Results  Component Value Date   CREATININE 1.69 (H) 08/09/2020   BUN 22 (H) 08/09/2020   GFR 52.73 (L) 05/20/2011   GFRNONAA 36 (L) 08/09/2020   GFRAA 29 (L) 03/13/2020   NA 137 08/09/2020   K 5.0 08/09/2020   CALCIUM 9.0 08/09/2020   CO2 24 08/09/2020   GLUCOSE 128 (H) 08/09/2020    Lab Results  Component Value Date/Time   HGBA1C 5.3 08/08/2020 06:01 AM   HGBA1C 5.2 04/23/2020 07:35 PM   HGBA1C 6.8 (H) 12/30/2013 03:55 AM   HGBA1C 8.5 (H) 01/29/2013 04:57 AM   GFR 52.73 (L) 05/20/2011 02:49 PM   GFR 50.30 (L) 04/30/2011 02:34 PM   MICROALBUR 20 10/14/2016 03:52 PM   MICROALBUR 50 04/05/2015 11:46 AM    Last diabetic Eye exam:  Lab Results  Component Value Date/Time   HMDIABEYEEXA Retinopathy (A) 11/29/2019 10:08 AM    Last diabetic Foot exam: No results found for: HMDIABFOOTEX   Lab Results  Component Value Date   CHOL 180 08/08/2020   HDL 35 (L) 08/08/2020   LDLCALC 121 (H) 08/08/2020   LDLDIRECT 83.3 01/26/2011   TRIG 118 08/08/2020   CHOLHDL 5.1 08/08/2020    Hepatic Function Latest Ref Rng & Units 04/23/2020 04/18/2020 03/13/2020  Total Protein 6.5 -  8.1 g/dL 6.1(L) 5.8(L) 5.9(L)  Albumin 3.5 - 5.0 g/dL 2.9(L) 3.2(L) 3.3(L)  AST 15 - 41 U/L _0 ALT 0 - 44 U/L _1 Alk  Phosphatase 38 - 126 U/L 142(H) 171(H) 141(H)  Total Bilirubin 0.3 - 1.2 mg/dL 0.5 0.2 0.2  Bilirubin, Direct 0.0 - 0.2 mg/dL - - -    Lab Results  Component Value Date/Time   TSH 1.600 04/23/2020 10:19 PM   TSH 1.410 04/18/2020 02:01 PM   TSH 3.030 03/13/2020 09:52 AM    CBC Latest Ref Rng & Units 08/20/2020 08/09/2020 08/07/2020  WBC 4.0 - 10.5 K/uL 8.1 9.5 6.1  Hemoglobin 12.0 - 15.0 g/dL 10.8(L) 11.3(L) 10.5(L)  Hematocrit 36.0 - 46.0 % 34.6(L) 36.7 34.2(L)  Platelets 150 - 400 K/uL 174 218 197    Lab Results  Component Value Date/Time   VD25OH 31 12/18/2016 12:21 PM   VD25OH 19 (L) 09/16/2009 08:53 PM    Clinical ASCVD: Yes  The ASCVD Risk score Mikey Bussing DC Jr., et al., 2013) failed to calculate for the following reasons:   The patient has a prior MI or stroke diagnosis    Depression screen [facility] 2/9 01/04/2020 08/22/2019 08/22/2019  Decreased Interest _2 Down, Depressed, Hopeless _3 PHQ - 2 Score _4 Altered sleeping _5 Tired, decreased energy _6 Change in appetite _7 Feeling bad or failure about yourself  _8 Trouble concentrating _9 Moving slowly or fidgety/restless 3 2 0  Suicidal thoughts 2 0 0  PHQ-9 Score _10 Difficult doing work/chores Very difficult Very difficult Very difficult  Some recent data might be hidden    Social History   Tobacco Use  Smoking Status Former   Packs/day: 0.25   Years: 1.00   Pack years: 0.25   Types: Cigarettes   Quit date: 08/31/1994   Years since quitting: 25.9  Smokeless Tobacco Never  Tobacco Comments   quit 28 years ago   BP Readings from Last 3 Encounters:  08/22/20 (!) 155/88  08/09/20 (!) 171/82  07/04/20 (!) 172/108   Pulse Readings from Last 3 Encounters:  08/22/20 79  08/09/20 68  07/04/20 85   Wt Readings from Last 3 Encounters:  08/22/20 143 lb (64.9 kg)  08/08/20 145 lb 1 oz (65.8 kg)  07/04/20 147 lb (66.7 kg)   BMI Readings from Last 3 Encounters:  08/22/20 25.33  kg/m  08/08/20 25.70 kg/m  07/04/20 26.04 kg/m    Assessment/Interventions: Review of patient past medical history, allergies, medications, health status, including review of consultants reports, laboratory and other test data, was performed as part of comprehensive evaluation and provision of chronic care management services.   SDOH:  (Social Determinants of Health) assessments and interventions performed: Yes  SDOH Screenings   Alcohol Screen: Not on file  Depression (PHQ2-9): Medium Risk   PHQ-2 Score: 24  Financial Resource Strain: Not on file  Food Insecurity: Not on file  Housing: Not on file  Physical Activity: Not on file  Social Connections: Not on file  Stress: Not on file  Tobacco Use: Medium Risk   Smoking Tobacco Use: Former   Smokeless Tobacco Use: Never  Transportation Needs: Not on file    [place]  Allergies  Allergen Reactions   Lipitor [Atorvastatin] Other (See Comments)    Leg pains  Medications Reviewed Today     Reviewed by Germaine Pomfret, [facility] (Pharmacist) on 08/23/20 at [phone]  Med List Status: <None>   Medication Order Taking? Sig Documenting Provider Last Dose Status Informant  ALPRAZolam (XANAX) 1 MG tablet [id number] Yes TAKE 1 TABLET BY MOUTH TWICE DAILY [name], MD Taking Active   amLODipine (NORVASC) 2.5 MG tablet [id number] Yes Take 2.5 mg by mouth daily. [provider] Taking Active Self  BAQSIMI ONE PACK 3 MG/DOSE POWD [id number] Yes Place 1 spray into both nostrils as directed. [provider] Taking Active   buPROPion [facility] SR) 100 MG 12 hr tablet [id number] Yes Take 100 mg by mouth 2 (two) times daily. [provider] Taking Active Self  carvedilol (COREG) 12.5 MG tablet [id number] Yes Take 1 tablet by mouth twice daily Gollan, Kathlene November, MD Taking Active   clopidogrel (PLAVIX) 75 MG tablet [id number] Yes Take 75 mg by mouth daily. [provider] Taking Active Self   Desvenlafaxine ER (PRISTIQ) 50 MG TB24 [id number] Yes Take 1 tablet (50 mg total) by mouth every morning. [name], MD Taking Active Self  diclofenac Sodium (VOLTAREN) 1 % GEL [id number] Yes Apply 2 g topically 4 (four) times daily. [provider] Taking Active   diphenoxylate-atropine (LOMOTIL) 2.5-0.025 MG tablet [id number] Yes Take 1 tablet by mouth 4 (four) times daily as needed for diarrhea or loose stools. [name], MD Taking Active   estradiol (ESTRACE) 0.1 MG/GM vaginal cream [id number] Yes Estrogen Cream Instruction Discard applicator Apply pea sized amount to tip of finger to urethra before bed. Wash hands well after application. Use Monday, Wednesday and Friday Billey Co, MD Taking Active Self  gabapentin (NEURONTIN) 300 MG capsule [id number] Yes Take 2 capsules (600 mg total) by mouth 2 (two) times daily. [name], MD Taking Active Self  glucose 4 GM chewable tablet [id number] Yes Chew 1 tablet (4 g total) by mouth 3 (three) times daily. Fritzi Mandes, MD Taking Active Self  isosorbide mononitrate (IMDUR) 120 MG 24 hr tablet [id number] Yes Take 1 tablet (120 mg total) by mouth daily. Minna Merritts, MD Taking Active Self  levothyroxine (SYNTHROID) 25 MCG tablet [id number] Yes Take 1 tablet by mouth once daily before breakfast [name], MD Taking Active   memantine [facility]) 5 MG tablet [id number] Yes Take 5 mg by mouth 2 (two) times daily. [provider] Taking Active Self  nitrofurantoin, macrocrystal-monohydrate, (MACROBID) 100 MG capsule [id number] Yes Take 1 capsule (100 mg total) by mouth daily. For 3 months. Billey Co, MD Taking Active Self  nitroGLYCERIN (NITROSTAT) 0.4 MG SL tablet [id number]  Place 1 tablet (0.4 mg total) under the tongue every 5 (five) minutes as needed for chest pain.  Patient not taking: Reported on 08/22/2020   Dwyane Dee, MD  Active Self  Polyethyl Glycol-Propyl Glycol [facility] OP) [id number] Yes Place 1  drop into both eyes daily as needed (dry eyes). [provider] Taking Active Self  promethazine (PHENERGAN) 25 MG tablet [id number] Yes Take 1 tablet (25 mg total) by mouth every 6 (six) hours as needed for nausea or vomiting. [name], MD Taking Active Self  rosuvastatin (CRESTOR) 40 MG tablet [id number] Yes Take 1 tablet (40 mg total) by mouth daily at 6 PM. [name], Kathlene November, MD Taking Active Self  traMADol (ULTRAM) 50 MG tablet [id number] Yes Take 50 mg by mouth every 6 (six) hours as needed. [provider] Taking Active Self  traZODone (DESYREL) 100 MG tablet [id number] Yes Take 1 to 2 tablets by mouth at bedtime [name], MD Taking Active   Ubrogepant (UBRELVY) 100 MG TABS [id number] Yes Take 100 mg by mouth as needed (migraine). Max of [provider] Taking Active Self            Patient Active Problem List   Diagnosis Date Noted   CVA (cerebral vascular accident) ([place]) 08/07/2020   Aphasia    Weakness of both lower extremities    Sepsis secondary to UTI ([place]) 04/23/2020   Hyperkalemia 04/23/2020   Unspecified atrial fibrillation ([place]) 04/23/2020   Family history of malignant neoplasm of gastrointestinal tract    Iron deficiency anemia secondary to blood loss (chronic)    Acute on chronic anemia 03/07/2020   AKI (acute kidney injury) ([place]) 03/07/2020   Upper GI bleed    Near syncope 01/25/2020   Diabetes mellitus ([place]) 01/25/2020   Dehydration 01/25/2020   Acute upper GI bleed 01/25/2020   Pain in limb 01/05/2020   Pulmonary embolism ([place]) 11/07/2019   Chest pain 11/05/2019   Type II diabetes mellitus with renal manifestations ([place]) 11/05/2019   Acute renal failure superimposed on stage 3a chronic kidney disease ([place]) 09/02/2019   Hypoglycemia    Family history of breast cancer 05/23/2019   Family history of colon cancer 05/23/2019   Retinopathy 05/12/2019   Abnormal LFTs 04/18/2019   Esophageal dysphagia    Gastrointestinal  tract imaging abnormality    Trochanteric bursitis, left hip 03/17/2019   Malignant neoplasm of vulva, unspecified ([place]) 03/10/2019   Peripheral vascular disease, unspecified ([place]) 03/10/2019   Paroxysmal supraventricular tachycardia ([place]) 03/10/2019   Contusion of left hip 03/06/2019   Chest pain, moderate coronary artery risk 01/27/2019   Melena    Recurrent chest pain 01/19/2019   Depression, major, single episode, moderate ([place]) 12/09/2018   Closed nondisplaced intertrochanteric fracture of left femur ([place]) 10/12/2018   Hypotension 06/26/2018   Autonomic neuropathy 03/24/2018   Acute delirium 03/03/2018   H/O gastric bypass 03/02/2018   Hypertension associated with stage 3 chronic kidney disease due to type 2 diabetes mellitus ([place]) 02/20/2018   SI (sacroiliac) joint dysfunction 12/02/2017   Unstable angina ([place]) 06/24/2017   Syncope 04/09/2017   Insomnia 03/18/2017   Ischemic cardiomyopathy    Arthritis    Anxiety    Tendinitis of upper biceps tendon of right shoulder 03/16/2017   Gastroenteritis 01/27/2017   Hx of colonic polyps    H/O medication noncompliance 12/14/2015   CAD (coronary artery disease)    Hypertensive heart disease    Status post bariatric surgery 06/04/2015   Carotid stenosis 04/30/2015   Stable angina pectoris ([place]) 04/17/2015   Iron deficiency anemia 03/22/2015   Vitamin B12 deficiency 02/18/2015   Misuse of medications for pain 02/18/2015   Major depressive disorder, recurrent, severe with psychotic features ([place]) 54/56/2563   Helicobacter pylori infection 11/23/2014   Malignant melanoma ([place]) 08/25/2014   Incomplete bladder emptying 07/12/2014   Hypothyroidism 12/30/2013   Aberrant subclavian artery 11/17/2013   Multiple sclerosis ([place]) 11/02/2013   History of CVA with residual deficit 06/20/2013   Headache, migraine 05/29/2013   Hyperlipidemia    GERD (gastroesophageal reflux disease)    Neuropathy ([place]) 01/02/2011   Stroke ([place]) 06/21/2008    Depression with anxiety 05/01/2008   Essential hypertension 05/01/2008    Immunization History  Administered Date(s) Administered   Influenza Whole 12/05/2008   Influenza,inj,Quad PF,6+ Mos 11/11/2014, 11/02/2015, 10/14/2016, 10/20/2017,  11/04/2018, 09/27/2019   PFIZER(Purple Top)SARS-COV-2 Vaccination 04/25/2019, 05/16/2019, 01/23/2020   Pneumococcal Polysaccharide-23 03/08/2012, 03/24/2014   Tdap 09/04/2011    Conditions to be addressed/monitored:  Hypertension, Hyperlipidemia, Diabetes, Coronary Artery Disease, GERD, Chronic Kidney Disease, Hypothyroidism, Depression, Anxiety, and Insomnia, and History of CVA  Care Plan : General Pharmacy (Adult)  Updates made by Germaine Pomfret, RPH since 08/23/2020 12:00 AM     Problem: Hypertension, Hyperlipidemia, Diabetes, Coronary Artery Disease, GERD, Chronic Kidney Disease, Hypothyroidism, Depression, Anxiety, and Insomnia, and History of CVA   Priority: High     Long-Range Goal: Patient-Specific Goal   Start Date: 08/23/2020  Expected End Date: 02/23/2021  This Visit's Progress: On track  Priority: High  Note:   Current Barriers:  Unable to achieve control of blood pressure  Unable to achieve control of cholesterol  Unable to achieve control of anxiety  Unable to achieve control of insomnia  Pharmacist Clinical Goal(s):  Patient will achieve control of blood pressure as evidenced by BP less than 140/90  Patient will achieve control of cholesterol as evidenced by LDL less than 70 Patient will achieve control of anxiety as evidenced by patient report Patient will achieve control of insomnia as evidenced by patient report through collaboration with PharmD and provider.   Interventions: 1:1 collaboration with [name], MD regarding development and update of comprehensive plan of care as evidenced by provider attestation and co-signature Inter-disciplinary care team collaboration (see longitudinal plan of  care) Comprehensive medication review performed; medication list updated in electronic medical record  Hypertension (BP goal <130/80) -Not ideally controlled -History of labile blood pressure, syncope  -Current treatment: Amlodipine 2.5 mg daily  Carvedilol 12.5 mg twice daily  Imdur 120 mg daily  -Medications previously tried: Clonidine, Diltiazem, enalapril   -Current home readings: 134/80 -Reports hypotensive symptoms: feels like she is going to pass out, once  -Does report chest pain at rest. Rates severity as 8/10, (10/10 is severe enough to require NG) -Educated on Daily salt intake goal < 2300 mg; Importance of home blood pressure monitoring; -Counseled to monitor BP at home 2-3 times weekly, document, and provide log at future appointments -Counseled to increase water intake to 2 bottles daily  -Recommended increasing amlodipine to 5 mg daily   Hyperlipidemia: (LDL goal < 70) -Uncontrolled -History of TIA, CVA -History of GI Bleed -Current treatment: Rosuvastatin 40 mg daily  -Current treatment: Clopidogrel 75 mg daily  -Medications previously tried: Atorvastatin, Ezetimibe, Fenofibrate,   -Patient endorses 100% adherence to rosuvastatin, but not filled since Dec 2021 at [facility] 140 mg SQ every 14 days  Diabetes (A1c goal <7%) -Controlled -Current hyperglycemic medications: None -Current hypoglycemic medications: Baqsimi 1 spray as needed for severe hypoglycemia  Glucose tablets three times daily as needed for hypoglycemia  -Medications previously tried: NA  -Recommended to continue current medication  Depression/Anxiety (Goal: Maintain stable mood) -Uncontrolled -Current treatment: Alprazolam 1 mg twice daily  Bupropion SR 100 mg twice daily  Desvenlafaxine ER 50 mg daily  Memantine 5 mg twice daily  Trazodone 100 mg 2 tablets nightly  -Medications previously tried/failed: Aripiprazole, brexpiprazole, cariprazine, citalopram,  escitalopram, Lunesta, lithium, quetiapine, temazepam, venlafaxine XR, zolpidem,  -PHQ9: 24 (12/21) -GAD7: 21 (07/21) -Was previously connected to psychiatry, but felt they were only interested in adjusting medications and wants a new referral for counseling. Patient referred to [facility] behavioral health.  -Sleeping 5 hours nightly. Trouble falling asleep, wakes up once nightly, takes ~30 minutes to fall back asleep. Trazodone  has provided some relief.  -Anxiety symptoms worsened recently, patient reports recent stressors include trying to buy the home she is currently living in.  -Bupropion not recommended in anxiety, patient does report it has helped some with her depression.  -Recommended cross taper off bupropion, starting Buspar 5 mg twice daily.   Hypothyroidism (Goal: Maintain stable thyroid function) -Controlled -Current treatment  Levothyroxine 25 mcg daily  -Medications previously tried: NA  -Recommended to continue current medication  Chronic Pain (Goal: Maintain pain relief) -Controlled -Current treatment  Diclofeanc 1% gel  Gabapentin 600 mg twice daily  Tramadol 50 mg every 6 hours as needed (Uses ~twice weekly)  -Medications previously tried: NA  -Denies symptoms of CNS depression  -Recommended to continue current medication  Chronic Kidney Disease Stage 3b  -All medications assessed for renal dosing and appropriateness in chronic kidney disease. -Rosuvastatin decrease to 10 mg daily if CrCL < 30 mL/min. Patient with intolerances to atorvastatin.  -Gabapentin recommended maintenance dose 300 mg three times daily. Patient denies symptoms of CNS depression. -Recommended to continue current medication  Patient Goals/Self-Care Activities Patient will:  - check blood pressure 2-3 times weekly, document, and provide at future appointments  Follow Up Plan: Telephone follow up appointment with care management team member scheduled for:  11/15/2020 at 9:00 AM       Medication Assistance: None required.  Patient affirms current coverage meets needs.  Compliance/Adherence/Medication fill history: Care Gaps: Shingrix Vaccine Pneumococcal Vaccine Urine Microalbumin COVID-19 Vaccine Foot Exam  Star-Rating Drugs: Rosuvastatin 40 mg last filled on 01/19/2020 for 90 day supply at [facility].  Patient's preferred pharmacy is:  [facility] [address] ([address]), [place] [address] Phone: [phone] Fax: [facility] ([address]), [place] [address] [place] [address] Phone: [phone] Fax: [phone]  [id number] [address] [place] [id number]: [phone]: [phone] [address]Phone: [phone]: [phone] Uses pill box? No - Uses [facility] Dose Pill Pack Pt endorses 100% compliance  We discussed: Current pharmacy is preferred with insurance plan and patient is satisfied with pharmacy services Patient decided to: Continue current medication management strategy  Care Plan and Follow Up Patient Decision:  Patient agrees to Care Plan and Follow-up.  Plan: Telephone follow up appointment with care management team member scheduled for:  11/15/2020 at 9:00 AM  AJunius Argyle PharmD, B[phone]

## 2020-08-24 ENCOUNTER — Other Ambulatory Visit: Payer: Self-pay | Admitting: Urology

## 2020-08-24 DIAGNOSIS — N390 Urinary tract infection, site not specified: Secondary | ICD-10-CM

## 2020-08-27 ENCOUNTER — Other Ambulatory Visit: Payer: Self-pay | Admitting: Student

## 2020-08-27 ENCOUNTER — Ambulatory Visit: Payer: Medicare Other

## 2020-08-27 ENCOUNTER — Other Ambulatory Visit (HOSPITAL_COMMUNITY): Payer: Self-pay | Admitting: Student

## 2020-08-27 DIAGNOSIS — M19031 Primary osteoarthritis, right wrist: Secondary | ICD-10-CM

## 2020-08-27 DIAGNOSIS — S63591A Other specified sprain of right wrist, initial encounter: Secondary | ICD-10-CM

## 2020-08-27 DIAGNOSIS — S63501D Unspecified sprain of right wrist, subsequent encounter: Secondary | ICD-10-CM

## 2020-08-28 ENCOUNTER — Encounter: Payer: Self-pay | Admitting: Family Medicine

## 2020-08-28 ENCOUNTER — Ambulatory Visit (INDEPENDENT_AMBULATORY_CARE_PROVIDER_SITE_OTHER): Payer: Medicare Other | Admitting: Family Medicine

## 2020-08-28 ENCOUNTER — Other Ambulatory Visit: Payer: Self-pay

## 2020-08-28 VITALS — BP 137/86 | HR 87 | Wt 153.0 lb

## 2020-08-28 DIAGNOSIS — R29898 Other symptoms and signs involving the musculoskeletal system: Secondary | ICD-10-CM

## 2020-08-28 DIAGNOSIS — G43809 Other migraine, not intractable, without status migrainosus: Secondary | ICD-10-CM | POA: Diagnosis not present

## 2020-08-28 DIAGNOSIS — I10 Essential (primary) hypertension: Secondary | ICD-10-CM | POA: Diagnosis not present

## 2020-08-28 NOTE — Patient Instructions (Signed)
Please review the attached list of medications and notify my office if there are any errors.   You are now able to drive an automobile without restrictions
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
## 2020-09-09 ENCOUNTER — Encounter: Payer: Self-pay | Admitting: Oncology

## 2020-09-13 ENCOUNTER — Telehealth: Payer: Self-pay

## 2020-09-13 NOTE — Telephone Encounter (Signed)
Patient left message on triage line asking for "Okie" to call her back as she has a question

## 2020-09-13 NOTE — Telephone Encounter (Signed)
Called pt she states that she is interested in having depends to wear at night, she states she spoke with the catheter supplier and they state they can provide them for her however they need [place] to order. Will obtain verbal orders from physician and fax in.

## 2020-09-17 IMAGING — US US ABDOMEN COMPLETE
1 series · 14 of 25 positions shown · non-contrast
Comparison: 03/19/2017 renal ultrasound, CT 01/27/2017

CLINICAL DATA: Nausea and vomiting

EXAM:
ABDOMEN ULTRASOUND COMPLETE

[Series 1: us abdomen complete · 0.23mm/px · 14 of 75 slices shown]
[im 1/75]
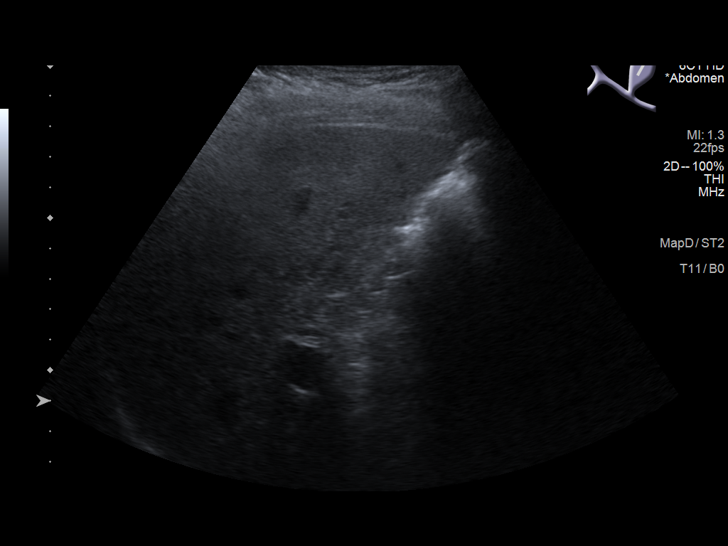
[im 7/75]
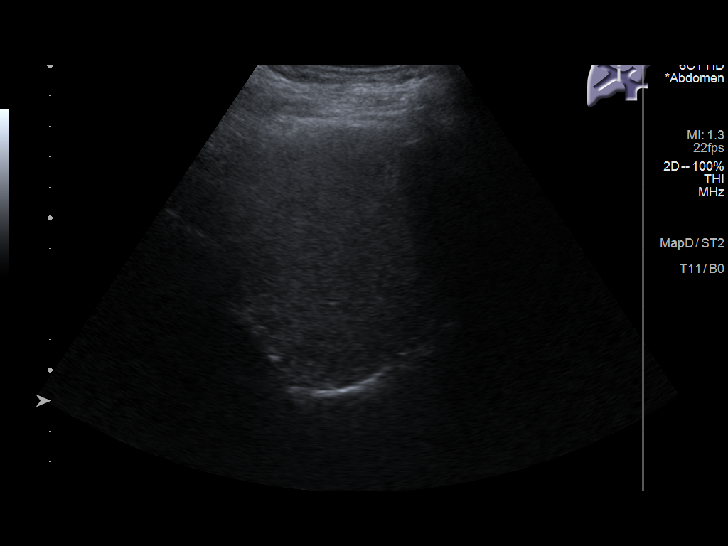
[im 13/75]
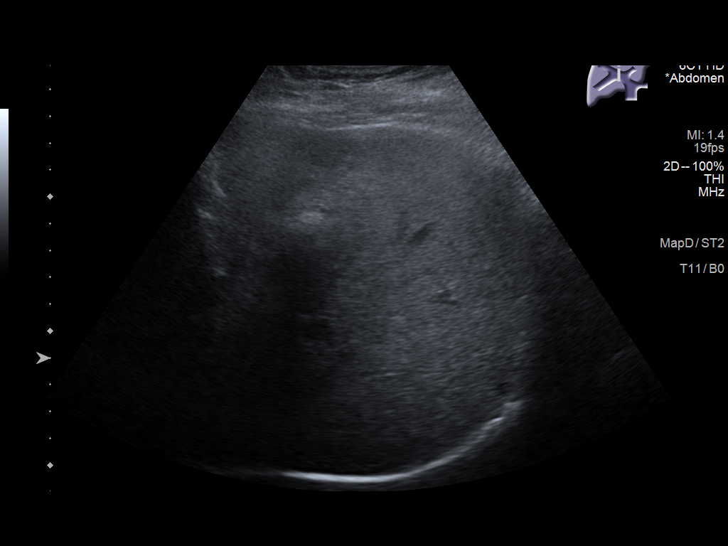
[im 19/75]
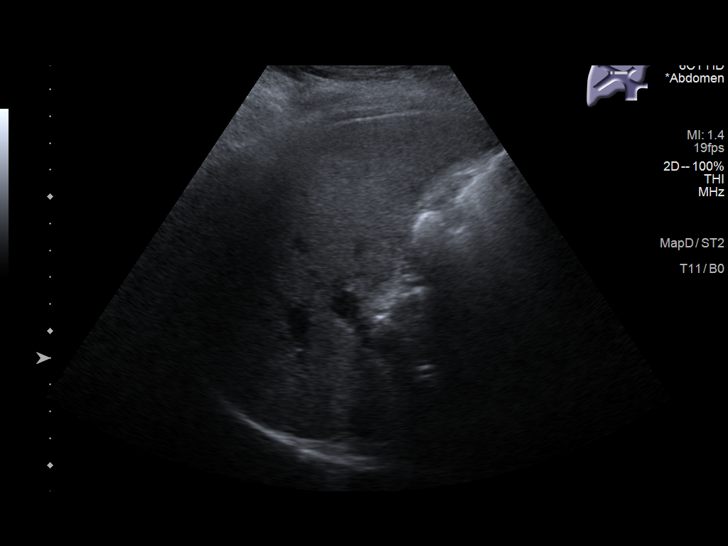
[im 25/75]
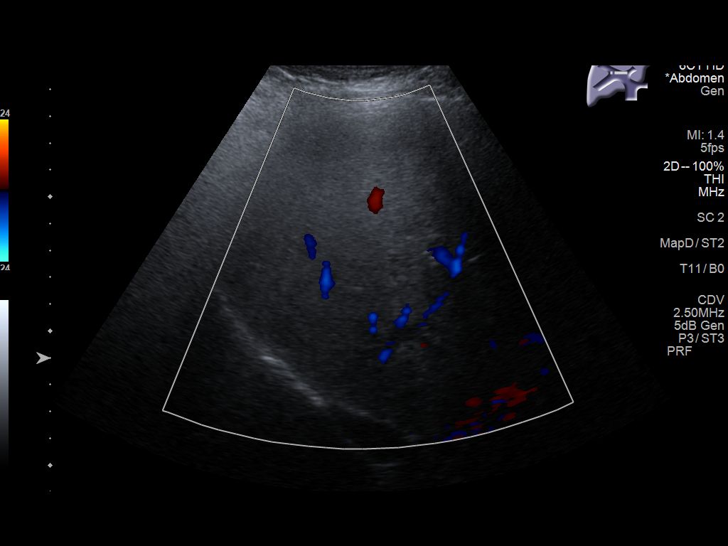
[im 28/75]
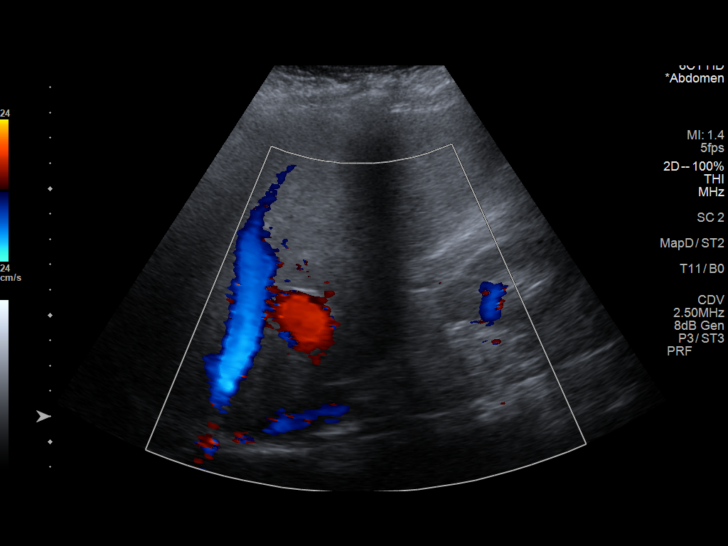
[im 34/75]
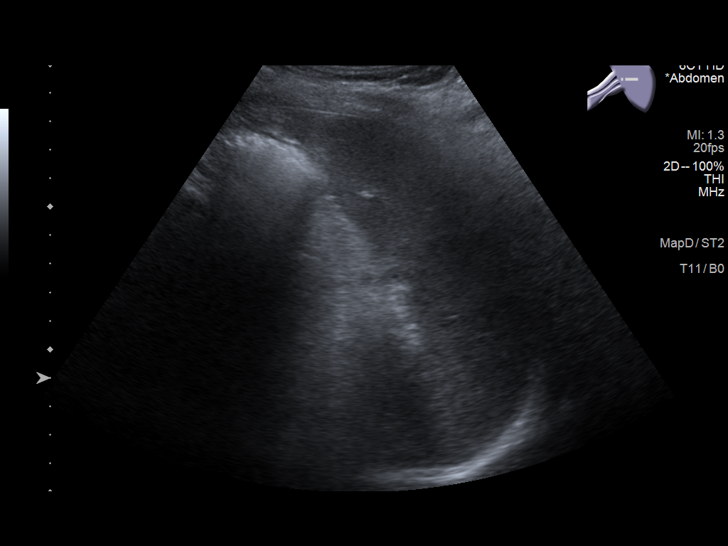
[im 41/75]
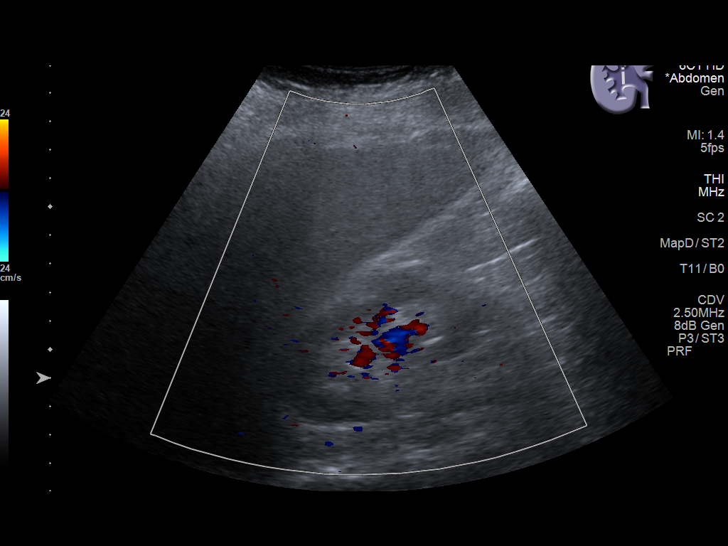
[im 47/75]
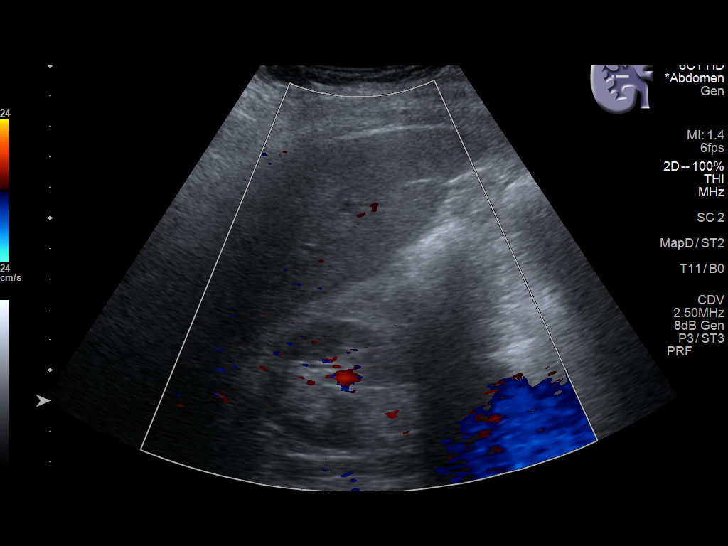
[im 50/75]
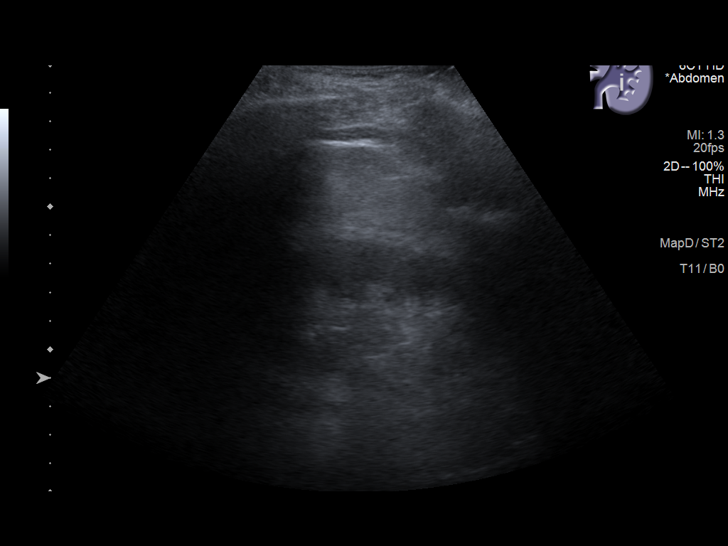
[im 56/75]
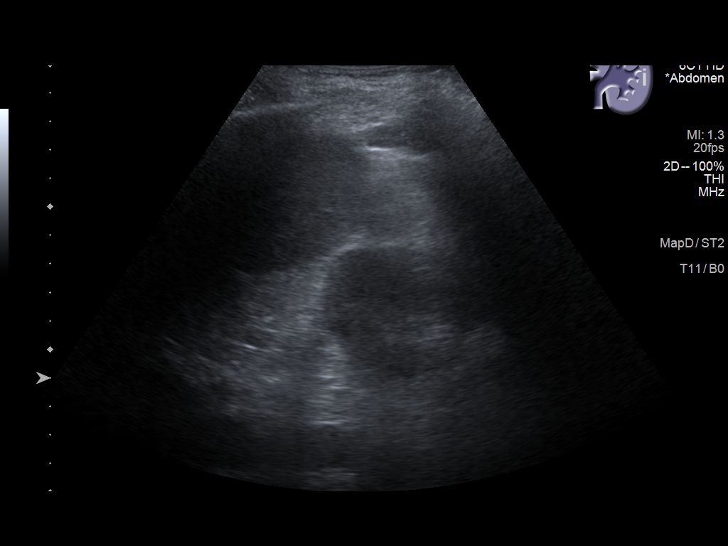
[im 62/75]
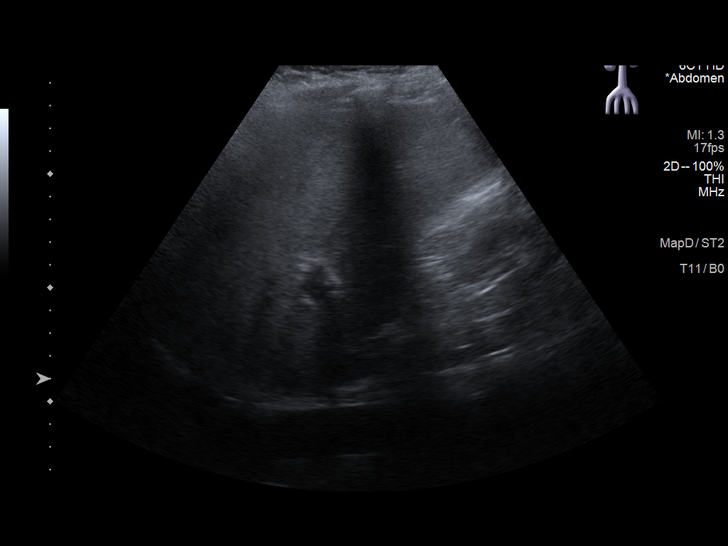
[im 68/75]
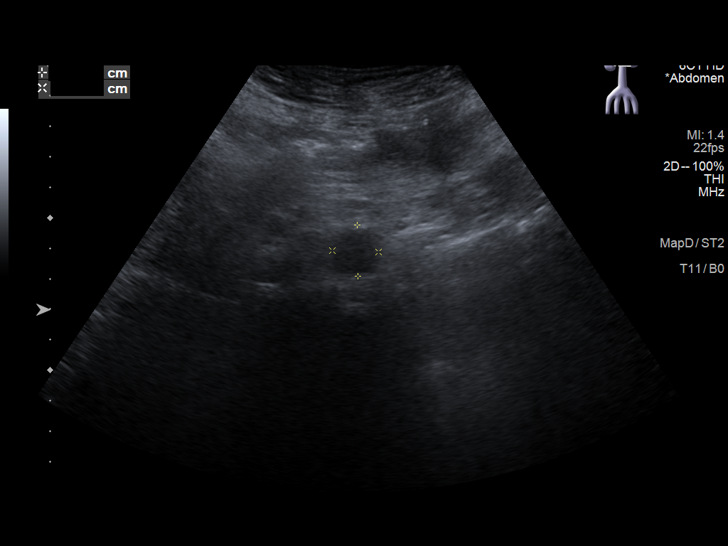
[im 75/75]
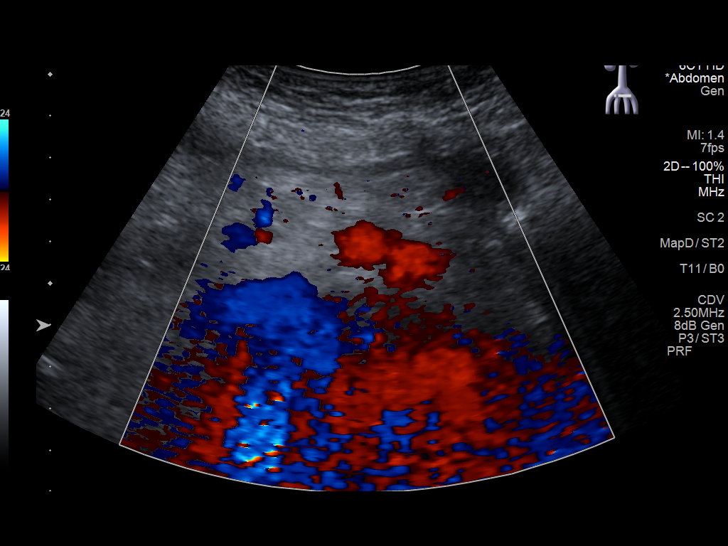

[14 of 25 positions shown; findings below may reference images not displayed]

FINDINGS: Gallbladder: Surgically absent

Common bile duct: Diameter: 3.4 mm

Liver: No focal lesion identified. Within normal limits in
parenchymal echogenicity. Portal vein is patent on color Doppler
imaging with normal direction of blood flow towards the liver.

IVC: No abnormality visualized.

Pancreas: Not well seen due to bowel gas

Spleen: Size and appearance within normal limits.

Right Kidney: Length: 8 cm. Echogenicity within normal limits. No
mass or hydronephrosis visualized.

Left Kidney: Length: 10.2 cm. Echogenicity within normal limits. No
mass or hydronephrosis visualized.

Abdominal aorta: No aneurysm visualized.  Maximum diameter 2.5 cm

Other findings: None.
IMPRESSION: 1. Status post cholecystectomy without biliary dilatation
2. Slight asymmetric size of kidneys right smaller than left but
kidneys otherwise appear morphologically normal.

## 2020-09-19 ENCOUNTER — Encounter: Payer: Self-pay | Admitting: Family Medicine
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
## 2020-09-24 IMAGING — DX DG PELVIS 1-2V
1 series · 1 of 1 positions shown · non-contrast
Comparison: None.

CLINICAL DATA: Fall.

EXAM:
PELVIS - 1-2 VIEW

[pelvis ap]
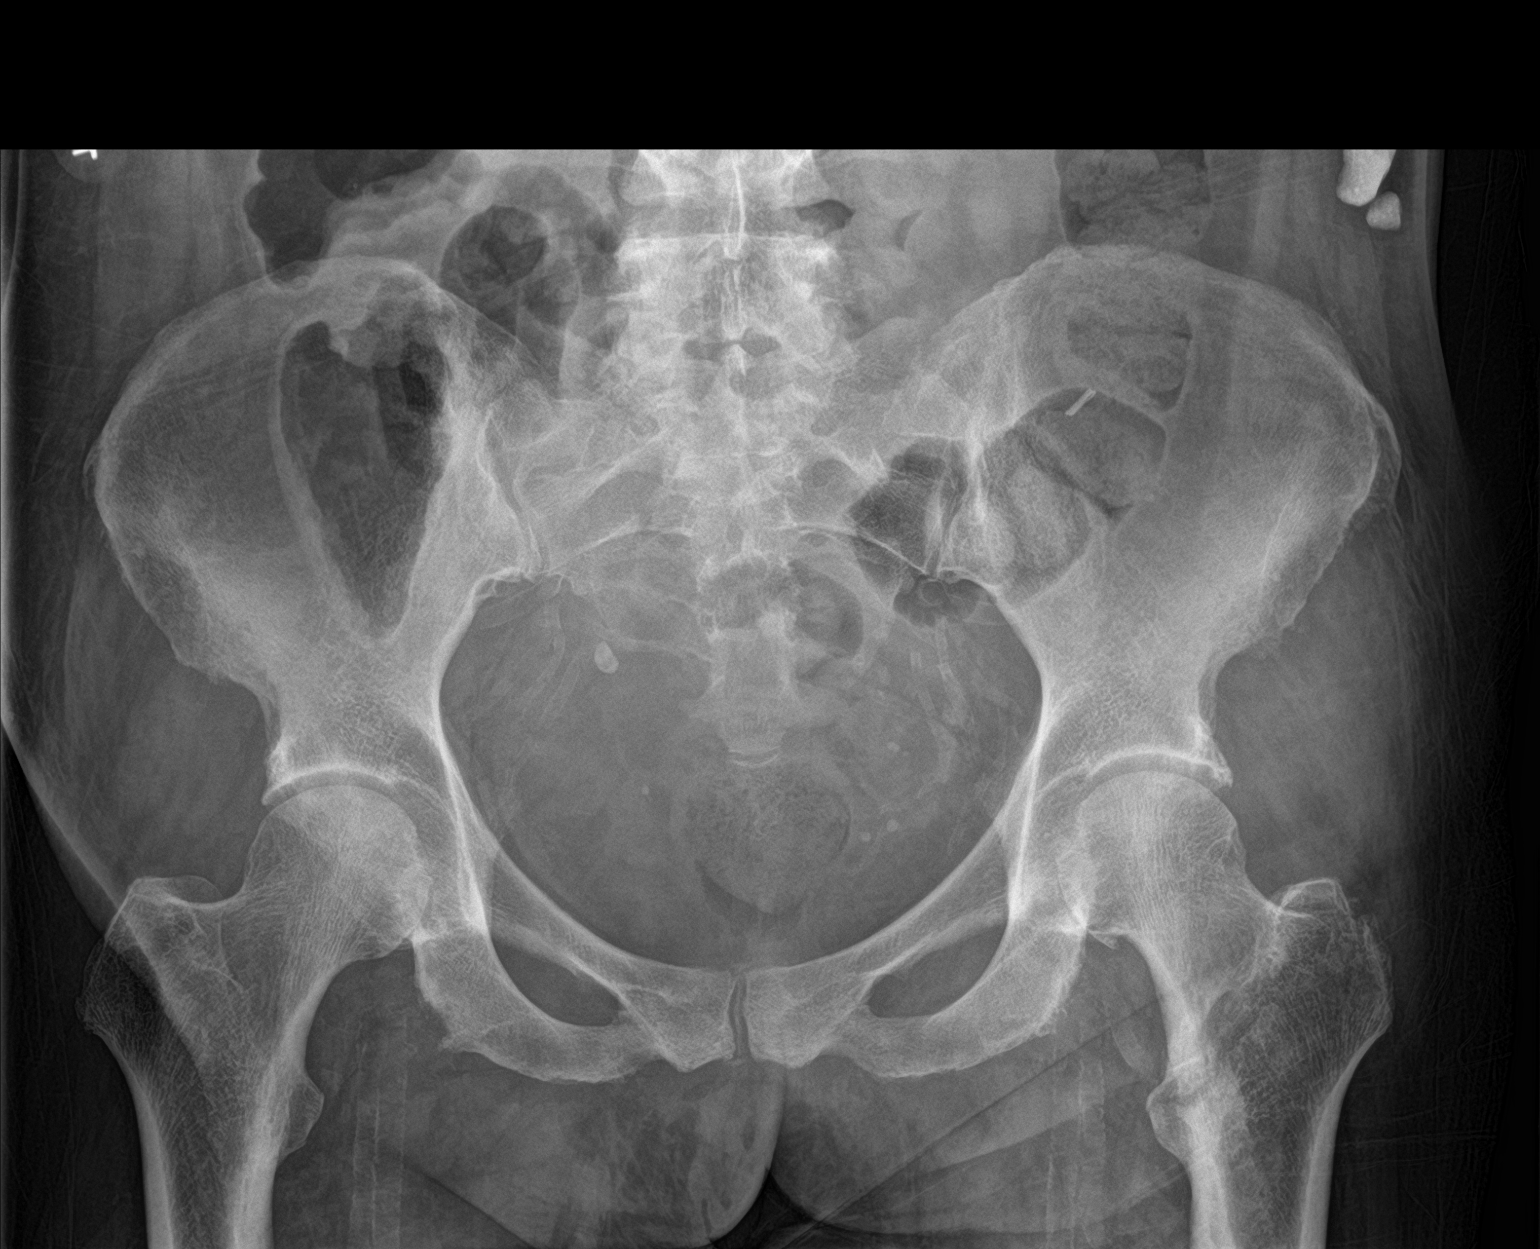

[1 of 1 positions shown; findings below may reference images not displayed]

FINDINGS: Minimally displaced fracture is seen involving the intertrochanteric
region of the proximal left femur. Right hip is unremarkable. Pelvis
appears normal.
IMPRESSION: Minimally displaced intertrochanteric fracture of the proximal left
femur.

## 2020-09-24 IMAGING — DX DG CHEST 1V
1 series · 1 of 1 positions shown · non-contrast
Comparison: Radiograph June 25, 2018.

CLINICAL DATA: Fall.

EXAM:
CHEST  1 VIEW

[chest ap]
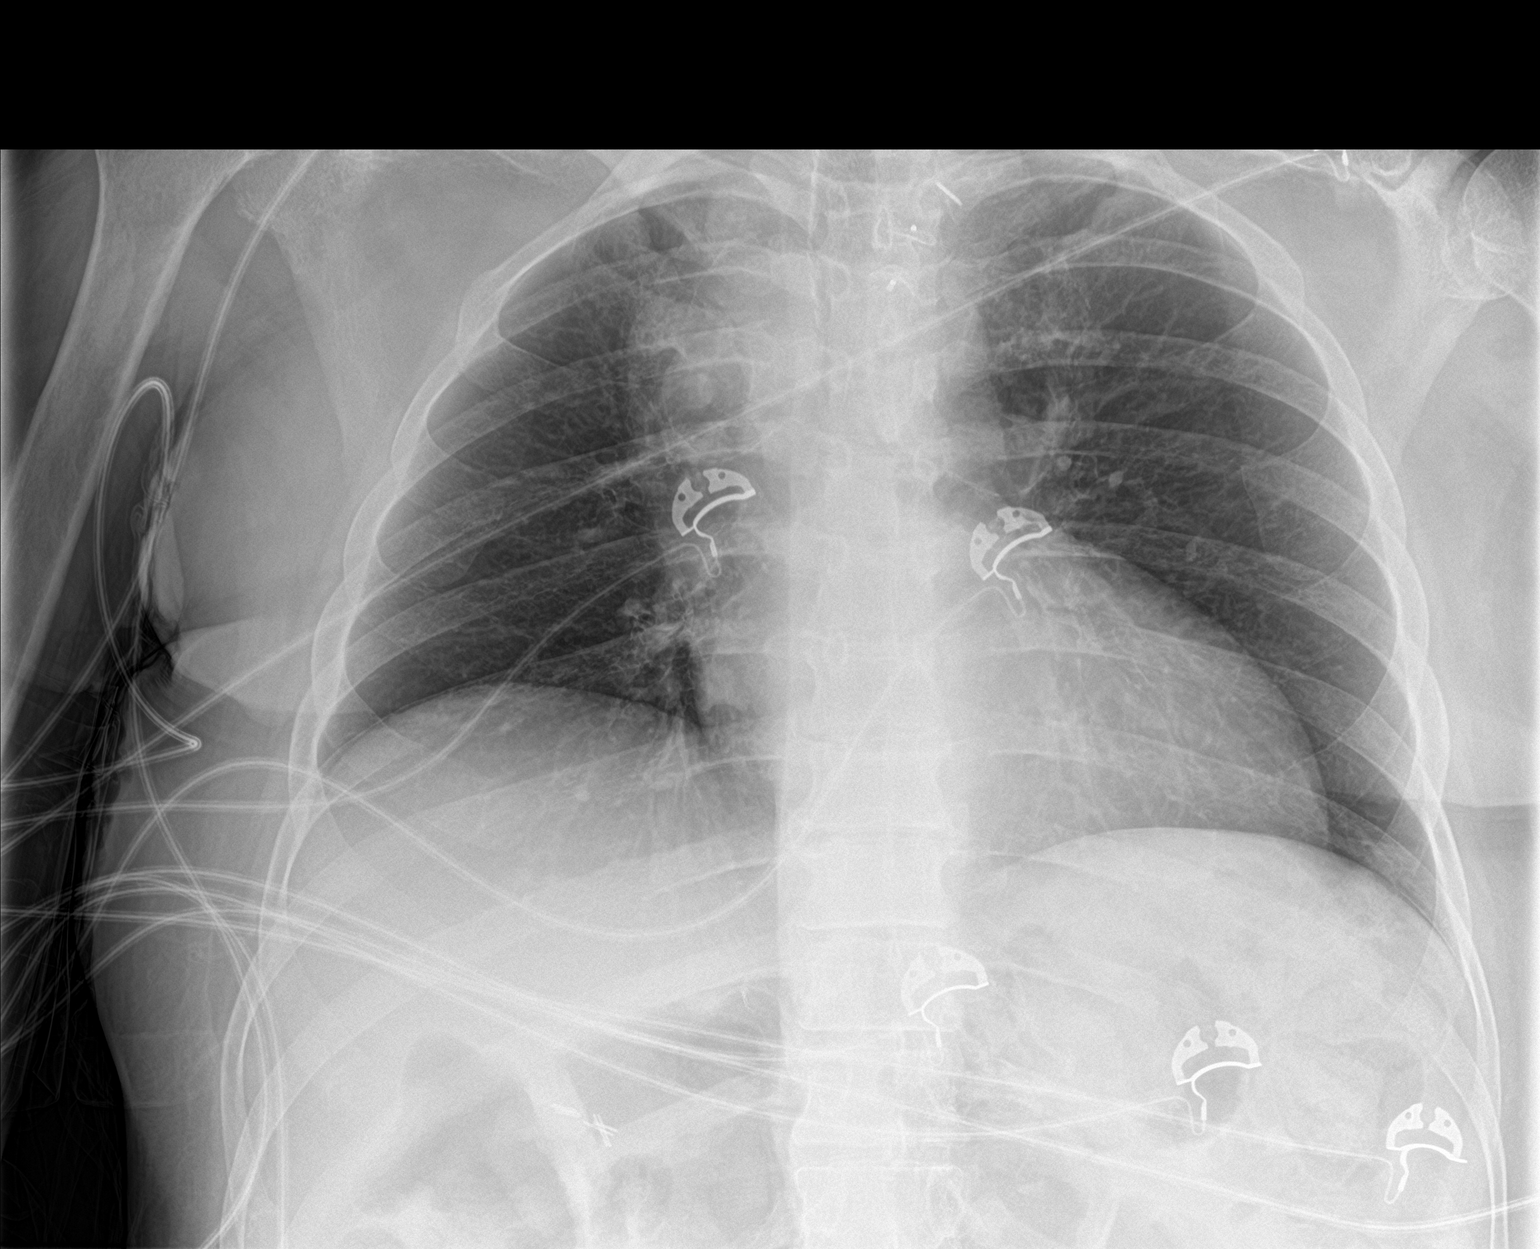

[1 of 1 positions shown; findings below may reference images not displayed]

FINDINGS: Stable cardiomediastinal silhouette. No pneumothorax or pleural
effusion is noted. Both lungs are clear. The visualized skeletal
structures are unremarkable.
IMPRESSION: No active disease.

## 2020-09-24 IMAGING — DX DG SHOULDER 2+V*L*
2 series · 2 of 2 positions shown · non-contrast
Comparison: None.

CLINICAL DATA: Status post fall backwards.

EXAM:
LEFT SHOULDER - 2+ VIEW

[shoulder y view]
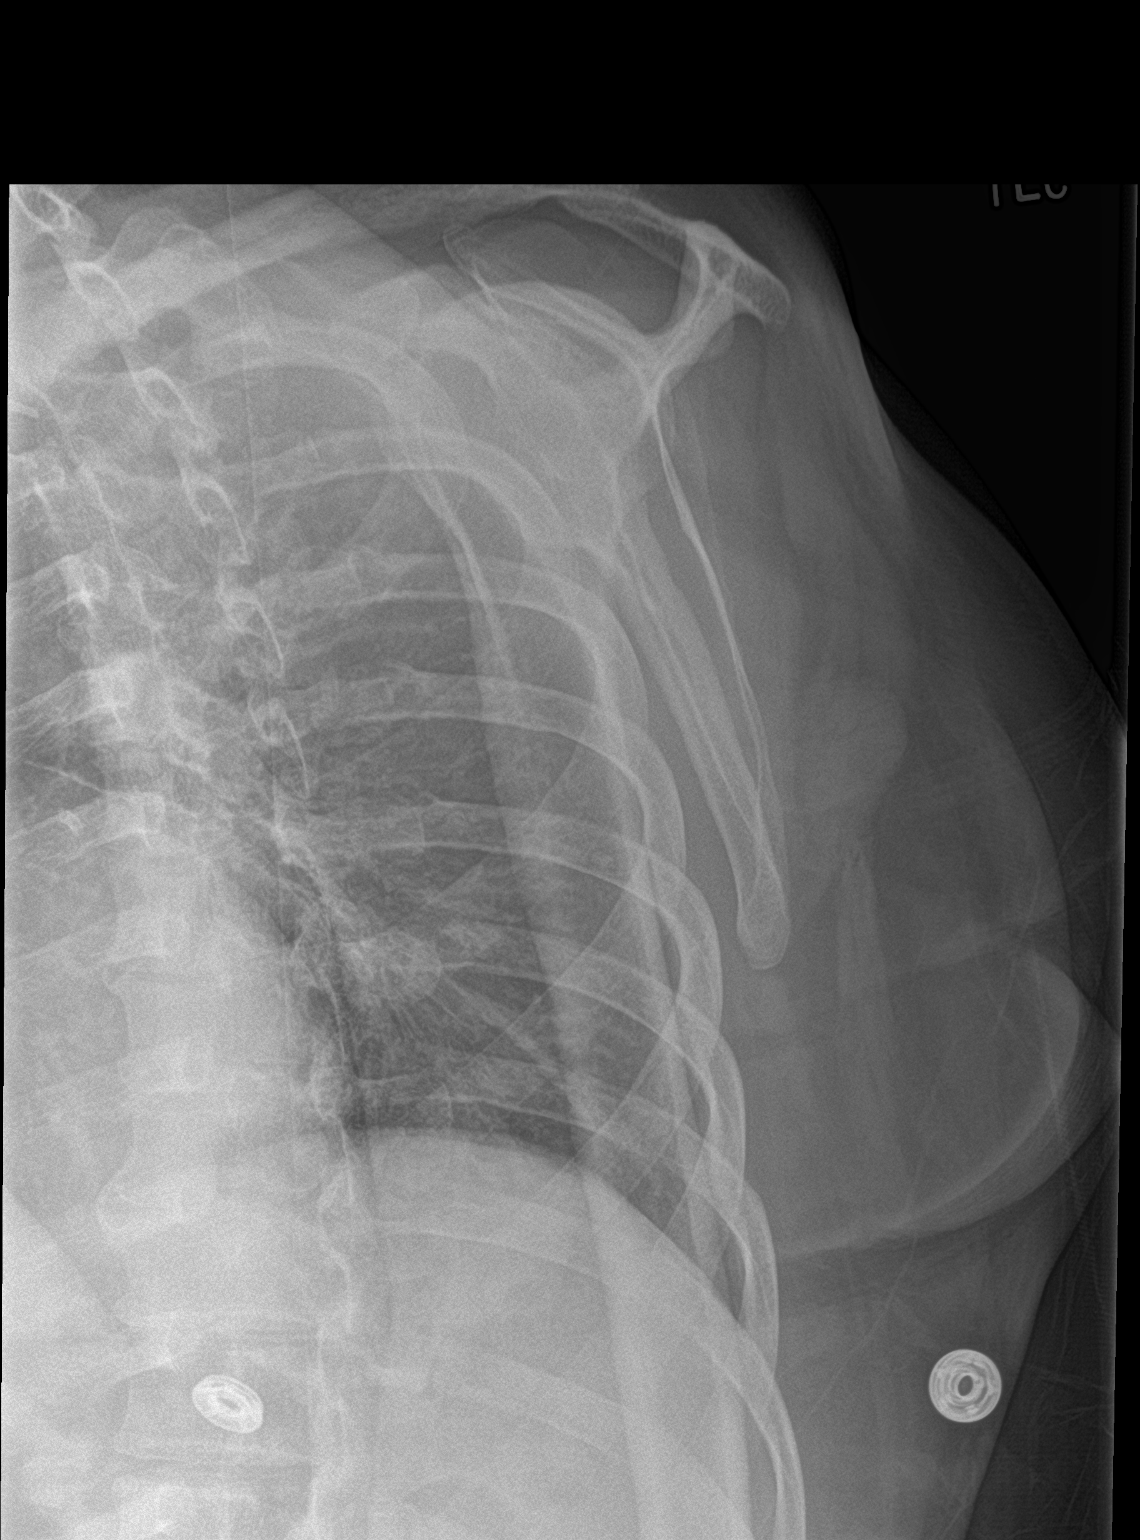

[shoulder ap neutral]
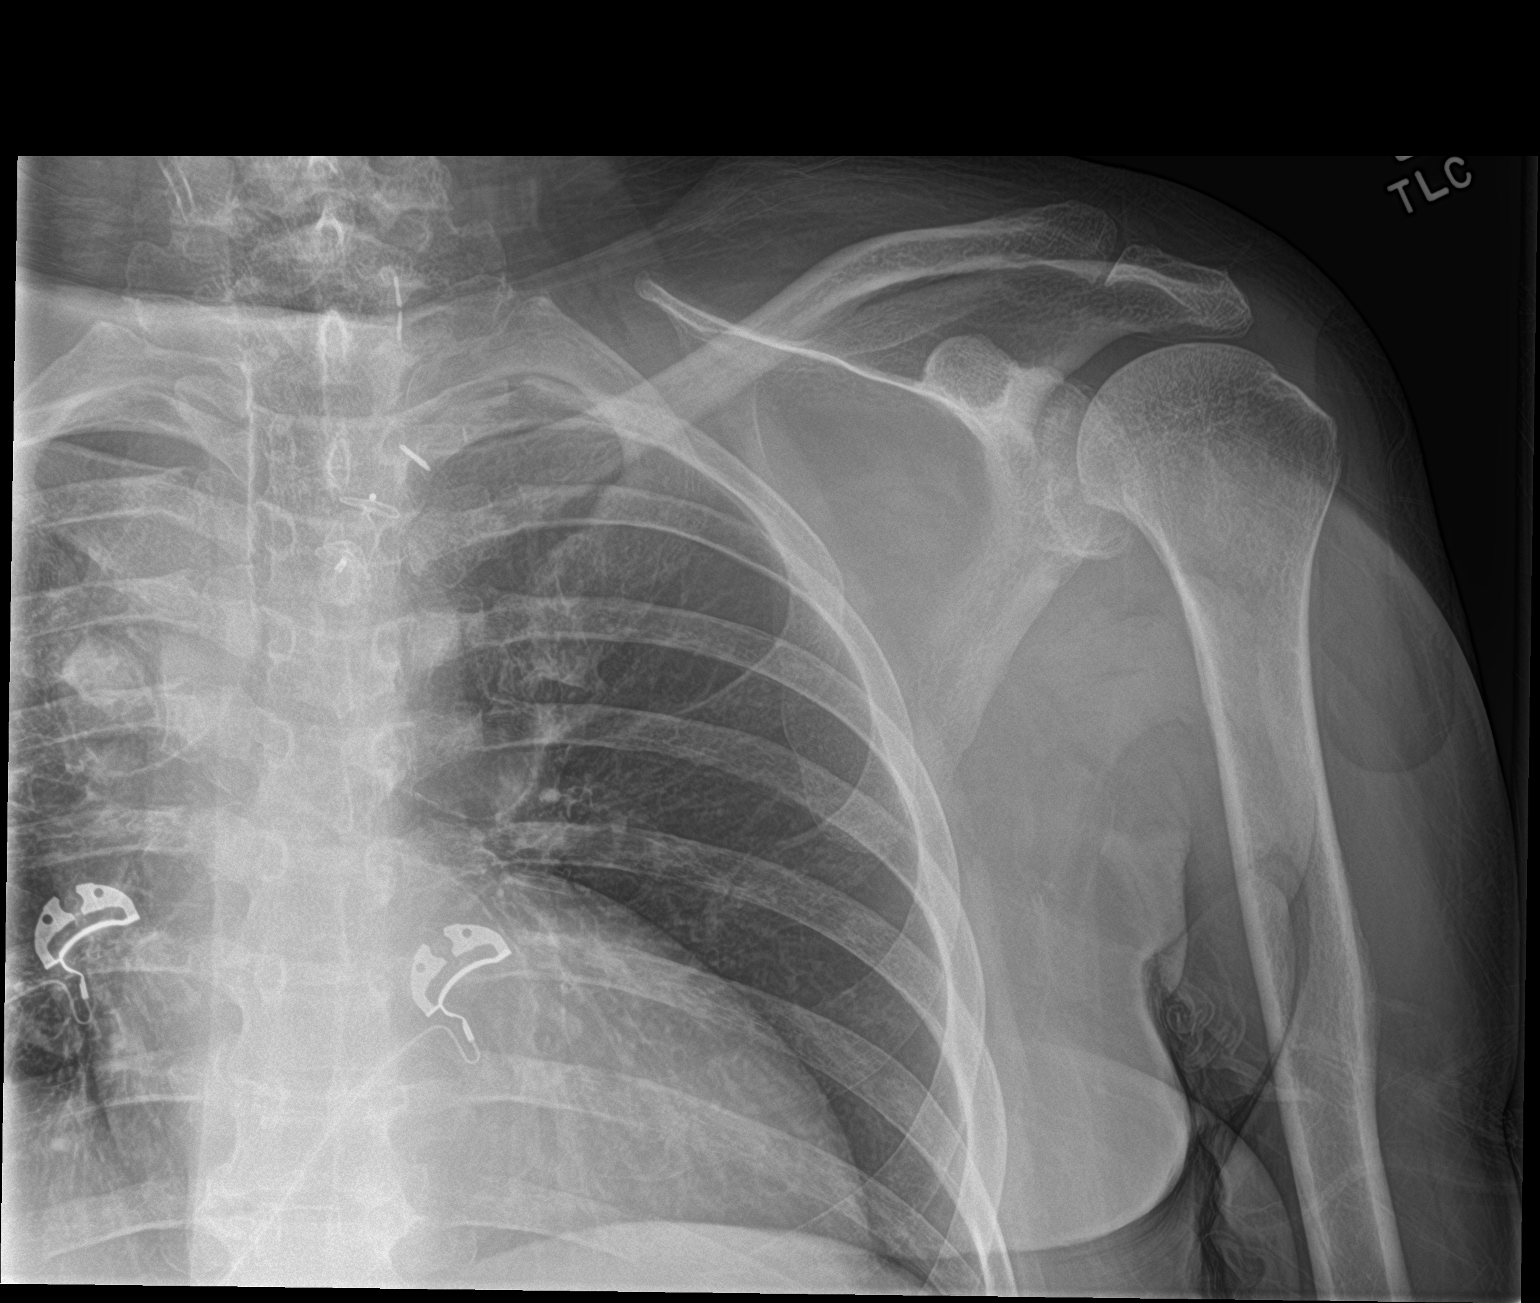

[2 of 2 positions shown; findings below may reference images not displayed]

FINDINGS: There is no fracture or dislocation. There is minimal osteoarthritis
of the left glenohumeral joint. There are mild degenerative changes
of the acromioclavicular joint.
IMPRESSION: No acute osseous injury of the left shoulder.

## 2020-09-25 IMAGING — RF DG FEMUR 2+V*L*
1 series · 4 of 4 positions shown · non-contrast
Comparison: 10/12/2018

CLINICAL DATA: Intramedullary nail left femur.

EXAM:
LEFT FEMUR 2 VIEWS; DG C-ARM 1-60 MIN

[Series 1: run · 4 of 4 slices shown]
[im 1/4]
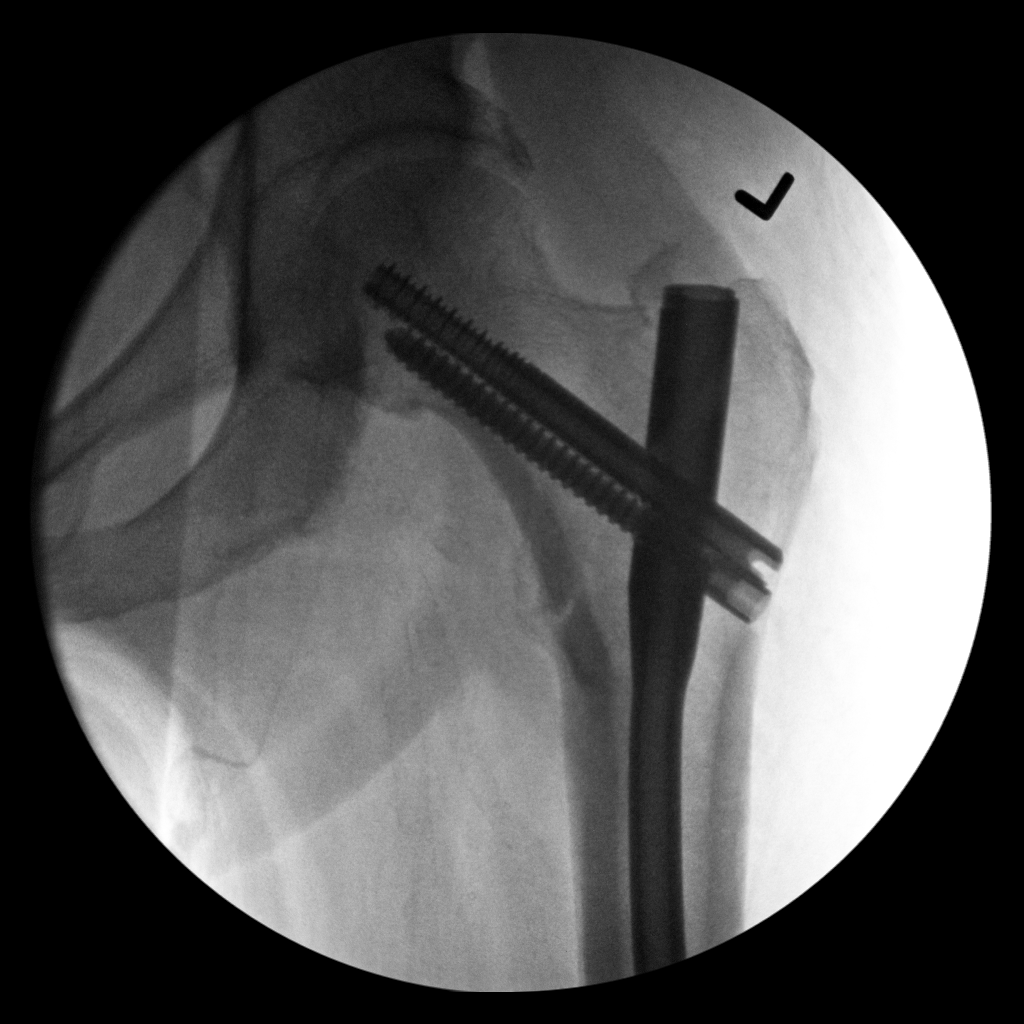
[im 2/4]
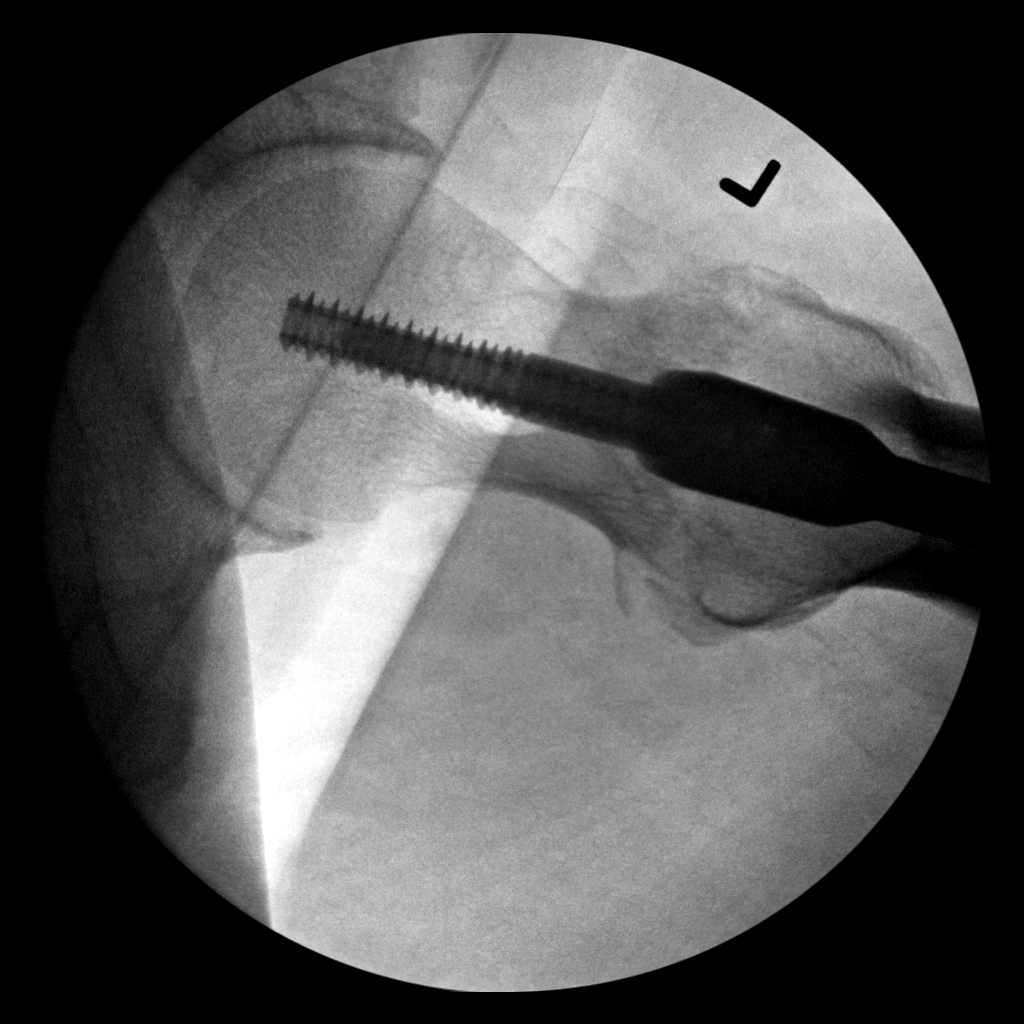
[im 3/4]
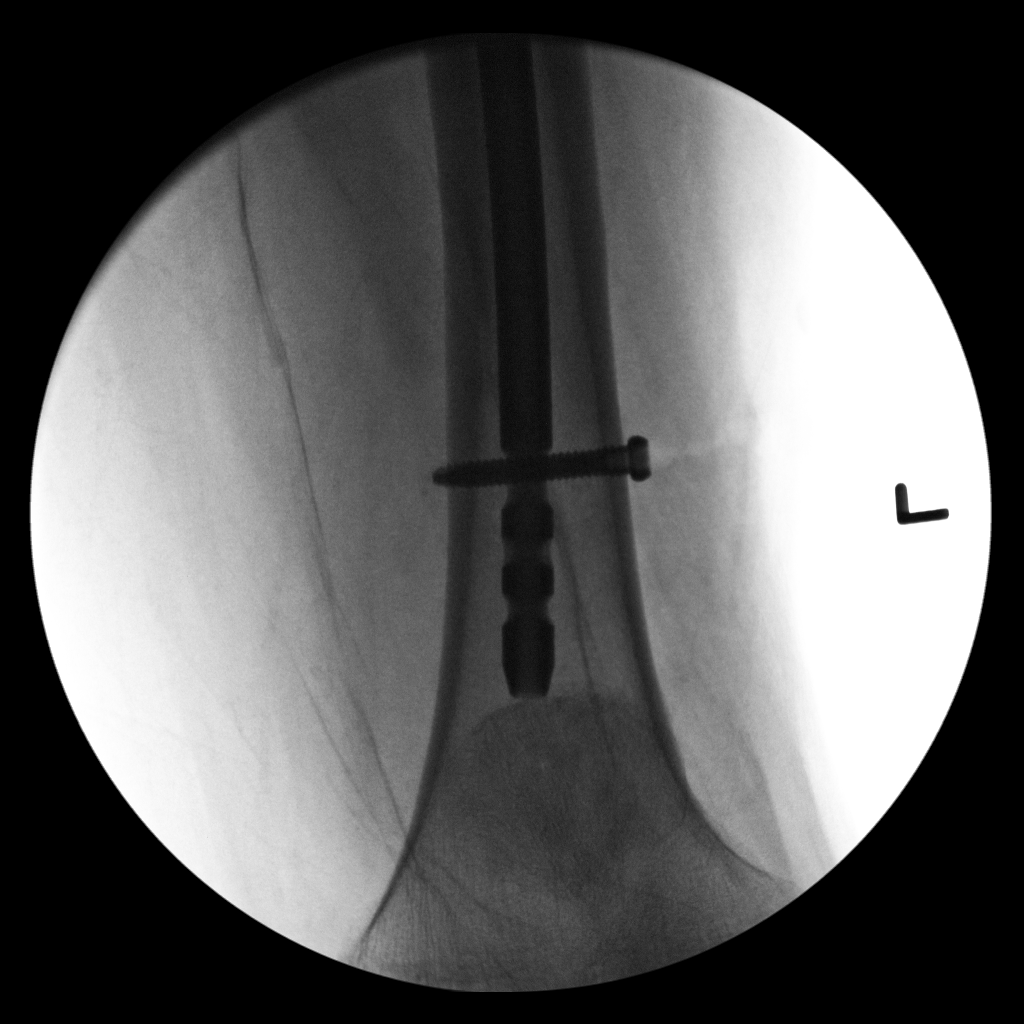
[im 4/4]
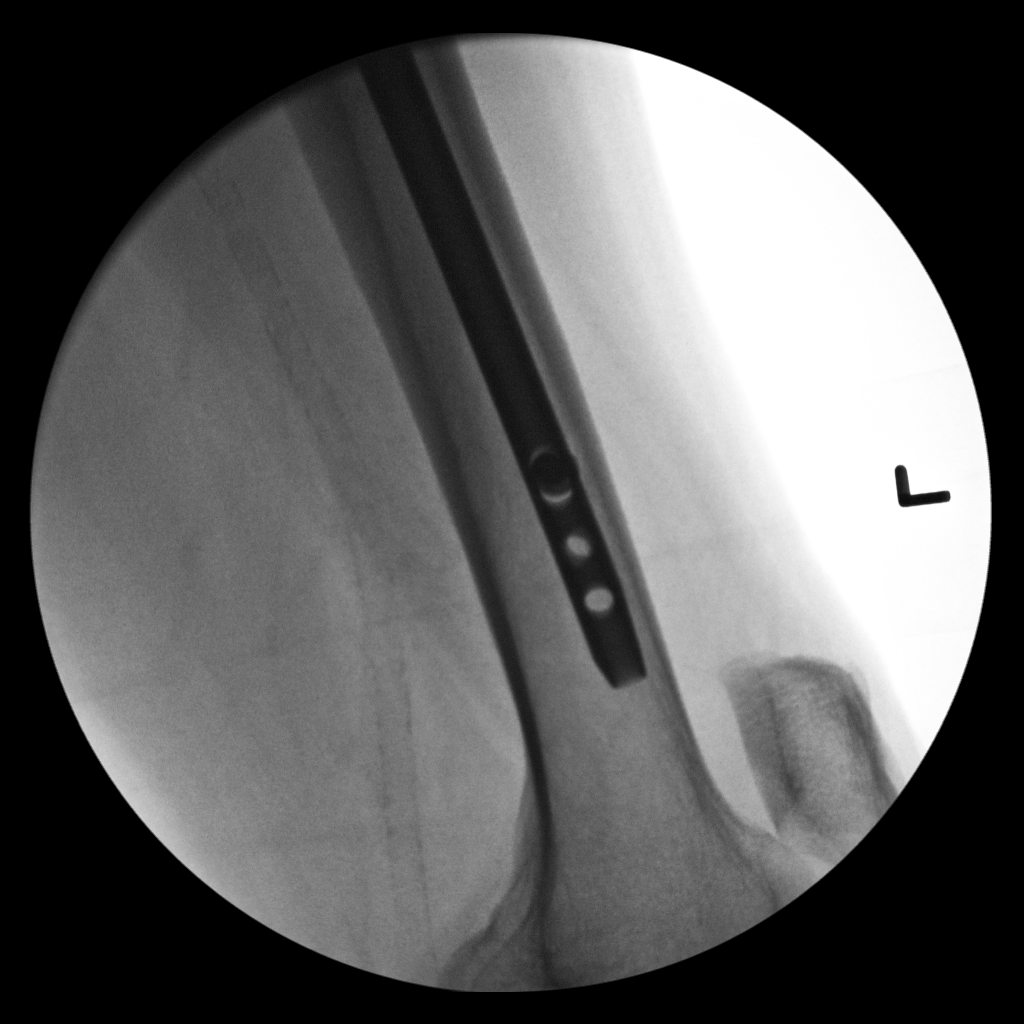

[4 of 4 positions shown; findings below may reference images not displayed]

FINDINGS: Interval fixation of patient's left femoral trochanteric fracture
with intramedullary nail and associated screws bridging the fracture
site into the femoral head. Hardware is intact with anatomic
alignment over the fracture site.
IMPRESSION: Fixation of left intertrochanteric fracture with hardware intact.

## 2020-09-25 IMAGING — RF DG C-ARM 1-60 MIN
1 series · 4 of 4 positions shown · non-contrast
Comparison: 10/12/2018

CLINICAL DATA: Intramedullary nail left femur.

EXAM:
LEFT FEMUR 2 VIEWS; DG C-ARM 1-60 MIN

[Series 1: run · 4 of 4 slices shown]
[im 1/4]
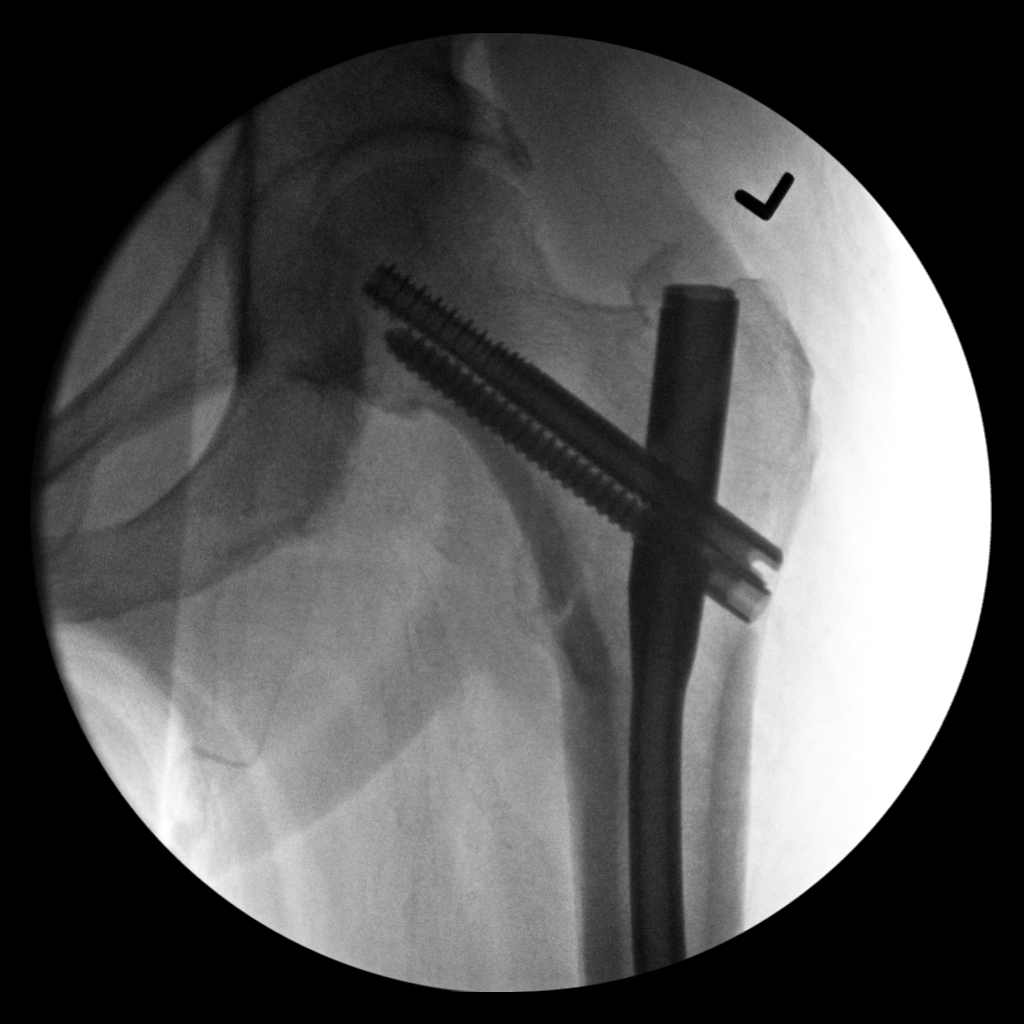
[im 2/4]
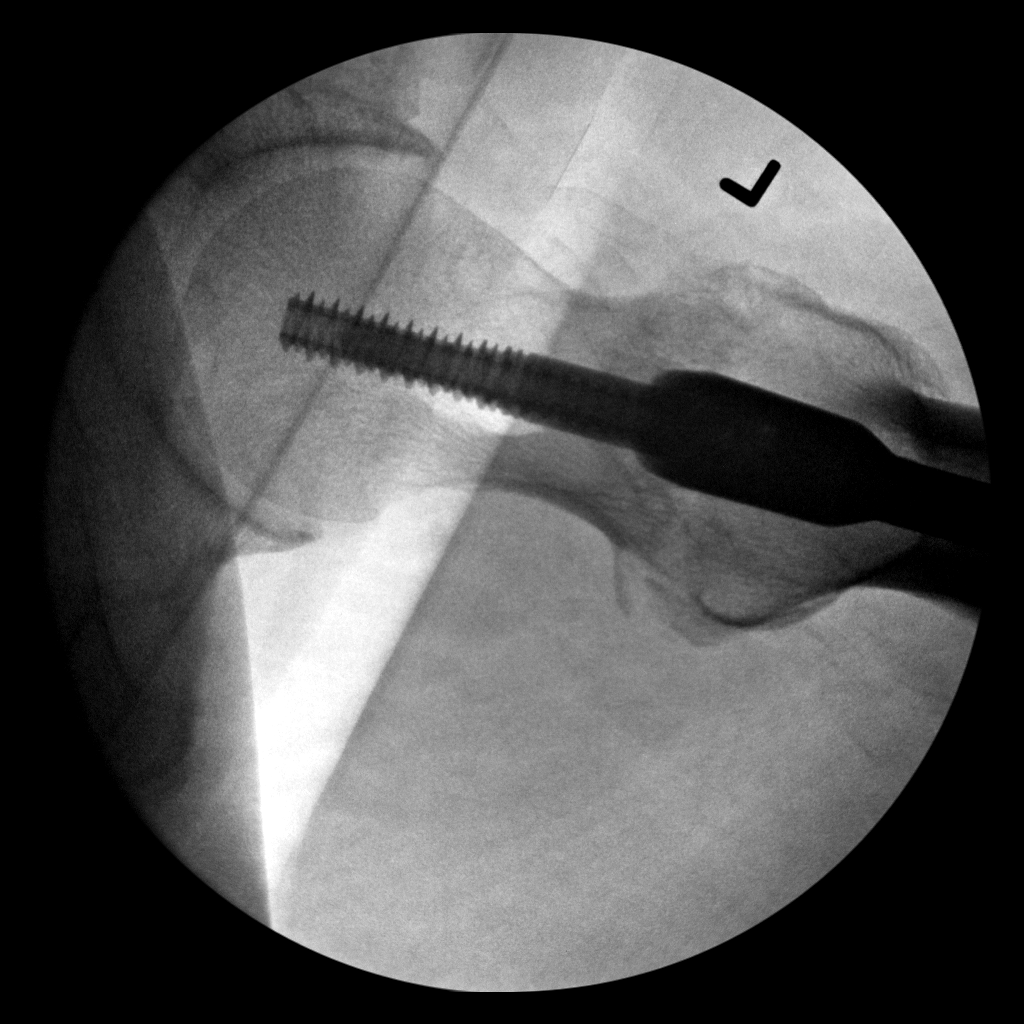
[im 3/4]
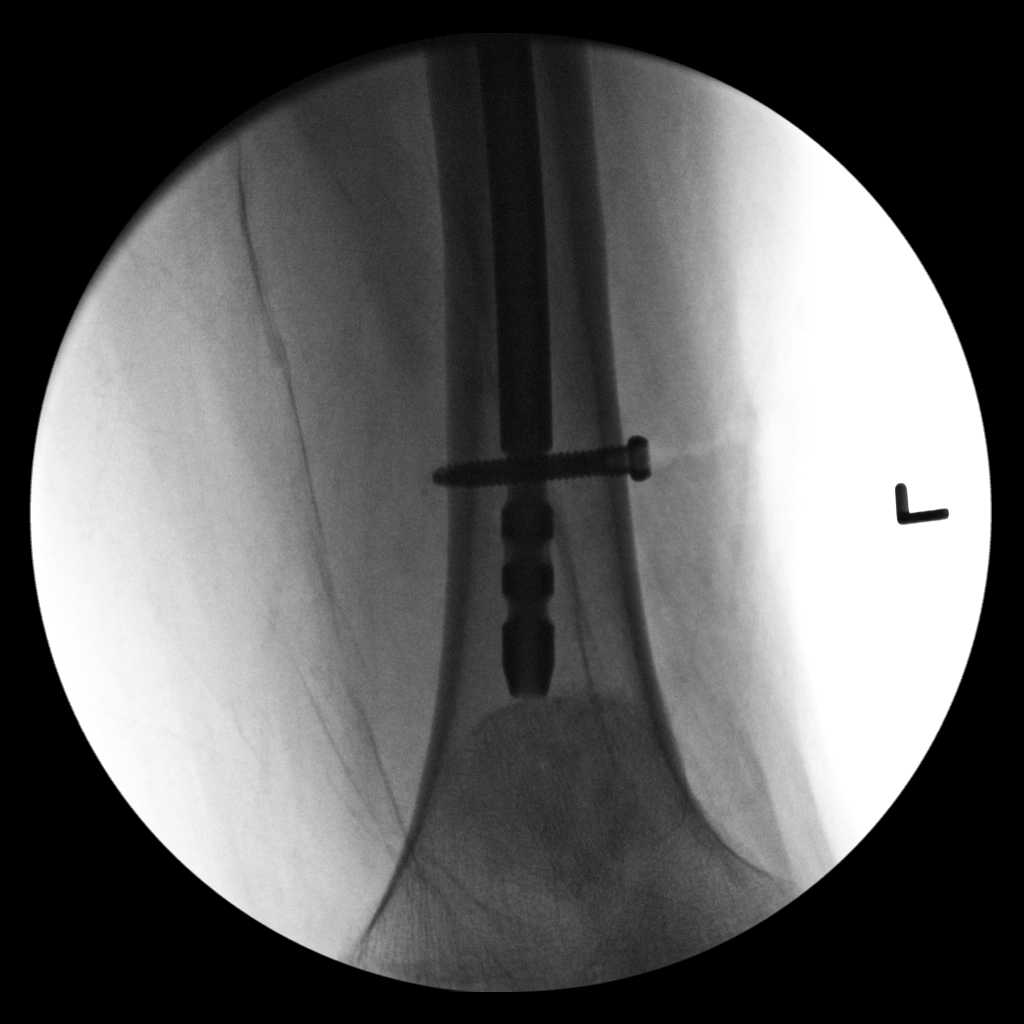
[im 4/4]
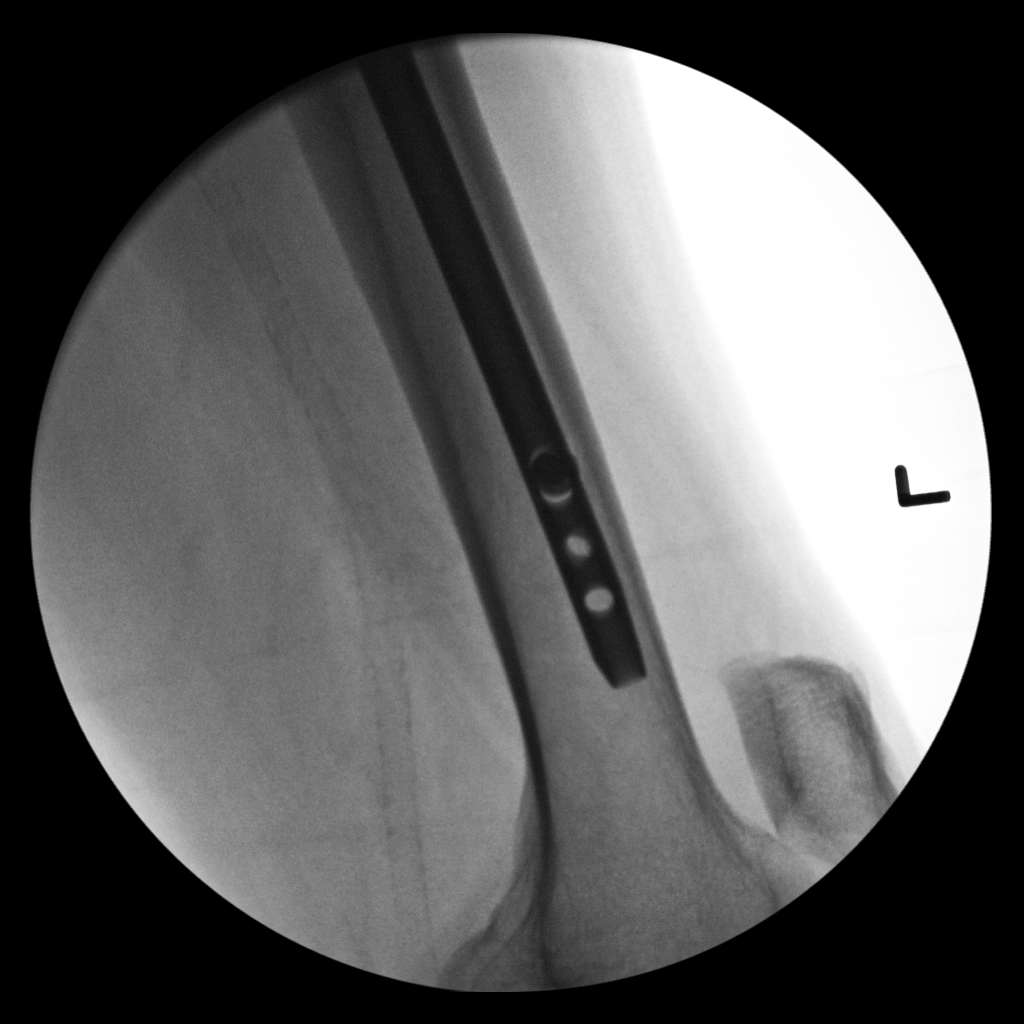

[4 of 4 positions shown; findings below may reference images not displayed]

FINDINGS: Interval fixation of patient's left femoral trochanteric fracture
with intramedullary nail and associated screws bridging the fracture
site into the femoral head. Hardware is intact with anatomic
alignment over the fracture site.
IMPRESSION: Fixation of left intertrochanteric fracture with hardware intact.

## 2020-09-26 ENCOUNTER — Other Ambulatory Visit: Payer: Self-pay | Admitting: Urology

## 2020-09-26 DIAGNOSIS — N390 Urinary tract infection, site not specified: Secondary | ICD-10-CM

## 2020-09-27 ENCOUNTER — Ambulatory Visit: Payer: Self-pay

## 2020-09-27 DIAGNOSIS — R5383 Other fatigue: Secondary | ICD-10-CM

## 2020-09-27 NOTE — Telephone Encounter (Signed)
Pt. Reports she started having black stools 4 days ago. Has diarrhea.States her GI doctor's office has closed for the day. No availability in the practice. Pt. Will go to ED.    Reason for Disposition  Patient sounds very sick or weak to the triager  Answer Assessment - Initial Assessment Questions 1. APPEARANCE of BLOOD: "What color is it?" "Is it passed separately, on the surface of the stool, or mixed in with the stool?"      Black 2. AMOUNT: "How much blood was passed?"      Black stool 3. FREQUENCY: "How many times has blood been passed with the stools?"      Every stool 4. ONSET: "When was the blood first seen in the stools?" (Days or weeks)      4 days ago 5. DIARRHEA: "Is there also some diarrhea?" If Yes, ask: "How many diarrhea stools in the past 24 hours?"      Yes 6. CONSTIPATION: "Do you have constipation?" If Yes, ask: "How bad is it?"     No 7. RECURRENT SYMPTOMS: "Have you had blood in your stools before?" If Yes, ask: "When was the last time?" and "What happened that time?"      Yes 8. BLOOD THINNERS: "Do you take any blood thinners?" (e.g., Coumadin/warfarin, Pradaxa/dabigatran, aspirin)     Plavix, aspirin 9. OTHER SYMPTOMS: "Do you have any other symptoms?"  (e.g., abdomen pain, vomiting, dizziness, fever)     No 10. PREGNANCY: "Is there any chance you are pregnant?" "When was your last menstrual period?"       No  Protocols used: Rectal Bleeding-A-AH

## 2020-10-02 NOTE — Telephone Encounter (Signed)
Please check with patient and see if this has resolved. Is don't see an ER visit as advised.

## 2020-10-02 NOTE — Telephone Encounter (Signed)
I called and spoke with patient. She states she did not go to the ER because she didn't want to wait around in the ER over night, for them to try to find a hospital bed for admittance. Patient states the only reason she called our office was because she was trying to request a lab slip to have her blood count checked. Patient says that for the past week she has been tired everyday. Her stools have been dark some days,  then other days they are a normal brown color. She also has had some diarrhea and abdominal pain. She has been taking Lomotil which she states was prescribed by her GI (Dr. Allen Norris) a while back. Patient says she has tried contacting Dr. Lynnell Jude office, but no one has returned her calls. Patient reports that the diarrhea, abdominal pain and dark stools have improved since last week. Patient would like to know if Dr. Caryn Section could order labs to check her blood count levels due to her fatigue. Please advise.

## 2020-10-03 ENCOUNTER — Telehealth: Payer: Self-pay | Admitting: Urology

## 2020-10-03 NOTE — Addendum Note (Signed)
Addended by: [name] on: 10/03/2020 07:50 AM   Modules accepted: Orders

## 2020-10-03 NOTE — Telephone Encounter (Signed)
[place] spoke with Lennette Bihari, CPHT advised him that refill will not be given until pt is seen and evaluated in the office per Dr. Doristine Counter last note. Pt scheduled for follow up 10/04/2020.

## 2020-10-03 NOTE — Telephone Encounter (Signed)
[name] w/[facility] pharmacy asking about denial for Nitrofuritin capsules.  [id number] Ref# [id number]

## 2020-10-03 NOTE — Telephone Encounter (Signed)
OK for CBC, please print and leave order at lab and advise patient.

## 2020-10-04 ENCOUNTER — Ambulatory Visit (INDEPENDENT_AMBULATORY_CARE_PROVIDER_SITE_OTHER): Payer: Medicare Other | Admitting: Physician Assistant

## 2020-10-04 ENCOUNTER — Other Ambulatory Visit: Payer: Self-pay

## 2020-10-04 ENCOUNTER — Encounter: Payer: Self-pay | Admitting: Physician Assistant

## 2020-10-04 VITALS — BP 147/78 | HR 57 | Ht 63.0 in | Wt 150.0 lb

## 2020-10-04 DIAGNOSIS — R300 Dysuria: Secondary | ICD-10-CM | POA: Diagnosis not present

## 2020-10-04 DIAGNOSIS — R339 Retention of urine, unspecified: Secondary | ICD-10-CM | POA: Diagnosis not present

## 2020-10-04 DIAGNOSIS — N3941 Urge incontinence: Secondary | ICD-10-CM | POA: Diagnosis not present

## 2020-10-04 LAB — BLADDER SCAN AMB NON-IMAGING: SCA Result: 337

## 2020-10-04 MED ORDER — NITROGLYCERIN 0.4 MG SL SUBL: 0.4000 mg | SUBLINGUAL_TABLET | SUBLINGUAL | 3 refills | Status: DC | PRN

## 2020-10-04 NOTE — Telephone Encounter (Signed)
Patient called, left VM to return the call to the office. 

## 2020-10-04 NOTE — Telephone Encounter (Signed)
Tried calling patient. Left message to call back. OK for PEC to advise patient then route message back to the office with patients response.

## 2020-10-04 NOTE — Progress Notes (Signed)
In and Out Catheterization  Patient is present today for a I & O catheterization due to recurrent UTI. Patient was cleaned and prepped in a sterile fashion with betadine . A 14FR cath was inserted no complications were noted , 25ml of urine return was noted, urine was yellow in color. A clean urine sample was collected for UA. Bladder was drained  And catheter was removed with out difficulty.    Preformed by: Elberta Leatherwood, CMA
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
## 2020-10-04 NOTE — Telephone Encounter (Signed)
Patient called, left VM to return the call back to the office for a message from Dr. Caryn Section.

## 2020-10-04 NOTE — Addendum Note (Signed)
Addended byMatilde Sprang on: 10/04/2020 02:24 PM   Modules accepted: Orders

## 2020-10-07 LAB — CULTURE, URINE COMPREHENSIVE

## 2020-10-08 ENCOUNTER — Telehealth: Payer: Self-pay

## 2020-10-08 LAB — MICROSCOPIC EXAMINATION

## 2020-10-08 LAB — URINALYSIS, COMPLETE
Bilirubin, UA: NEGATIVE
Glucose, UA: NEGATIVE
Ketones, UA: NEGATIVE
Leukocytes,UA: NEGATIVE
Nitrite, UA: NEGATIVE
Protein,UA: NEGATIVE
RBC, UA: NEGATIVE
Specific Gravity, UA: 1.01 (ref 1.005–1.030)
Urobilinogen, Ur: 0.2 mg/dL (ref 0.2–1.0)
pH, UA: 5.5 (ref 5.0–7.5)

## 2020-10-08 NOTE — Telephone Encounter (Signed)
Patient advised. Lab slip placed up front at [address].

## 2020-10-08 NOTE — Telephone Encounter (Signed)
-----   Message from Debroah Loop, [place] sent at 10/08/2020  8:41 AM EDT ----- Urine culture negative for infection.  I would like her to continue suppressive antibiotics and follow-up with Dr. Matilde Sprang as scheduled ----- Message ----- From: Interface, [facility] Lab Results In Sent: 10/06/2020   9:35 AM EDT To: Debroah Loop, PA-C

## 2020-10-08 NOTE — Telephone Encounter (Signed)
Notified patient as advised, patient expressed understanding.  

## 2020-10-08 NOTE — Addendum Note (Signed)
Addended by: Randal Buba on: 10/08/2020 10:55 AM   Modules accepted: Orders

## 2020-10-21 ENCOUNTER — Encounter: Payer: Self-pay | Admitting: Family Medicine

## 2020-10-24 ENCOUNTER — Telehealth: Payer: Self-pay

## 2020-10-24 NOTE — Progress Notes (Signed)
.   Chronic Care Management Pharmacy Assistant   Name: [name]  MRN: [id number] DOB: 04/24/1970  Reason for Encounter: Medication Review/General Adherence Call (GAD-7).   Recent office visits:  08/28/2020 Dr.Fisher MD (PCP) No medication Changes noted  Recent consult visits:  10/04/2020 Debroah Loop PA-C (Urology)No medication changes noted, Return in about 2 weeks  08/26/2020 Damaris Hippo PA (Orthopedic Surgery) start tramadol 50 mg PRN  Hospital visits:  None in previous 6 months  Medications: Outpatient Encounter Medications as of 10/24/2020  Medication Sig   ALPRAZolam (XANAX) 1 MG tablet TAKE 1 TABLET BY MOUTH TWICE DAILY   amLODipine (NORVASC) 2.5 MG tablet Take 2.5 mg by mouth daily.   BAQSIMI ONE PACK 3 MG/DOSE POWD Place 1 spray into both nostrils as directed.   buPROPion (WELLBUTRIN SR) 100 MG 12 hr tablet Take 100 mg by mouth 2 (two) times daily.   carvedilol (COREG) 12.5 MG tablet Take 1 tablet by mouth twice daily   clopidogrel (PLAVIX) 75 MG tablet Take 75 mg by mouth daily.   Desvenlafaxine ER (PRISTIQ) 50 MG TB24 Take 1 tablet (50 mg total) by mouth every morning.   diclofenac Sodium (VOLTAREN) 1 % GEL Apply 2 g topically 4 (four) times daily.   diphenoxylate-atropine (LOMOTIL) 2.5-0.025 MG tablet Take 1 tablet by mouth 4 (four) times daily as needed for diarrhea or loose stools.   estradiol (ESTRACE) 0.1 MG/GM vaginal cream Estrogen Cream Instruction Discard applicator Apply pea sized amount to tip of finger to urethra before bed. Wash hands well after application. Use Monday, Wednesday and Friday   gabapentin (NEURONTIN) 300 MG capsule Take 2 capsules (600 mg total) by mouth 2 (two) times daily.   glucose 4 GM chewable tablet Chew 1 tablet (4 g total) by mouth 3 (three) times daily.   isosorbide mononitrate (IMDUR) 120 MG 24 hr tablet Take 1 tablet (120 mg total) by mouth daily.   levothyroxine (SYNTHROID) 25 MCG tablet Take 1 tablet by mouth  once daily before breakfast   memantine (NAMENDA) 5 MG tablet Take 5 mg by mouth 2 (two) times daily.   nitrofurantoin, macrocrystal-monohydrate, (MACROBID) 100 MG capsule Take 1 capsule (100 mg total) by mouth daily. For 3 months.   nitroGLYCERIN (NITROSTAT) 0.4 MG SL tablet Place 1 tablet (0.4 mg total) under the tongue every 5 (five) minutes as needed for chest pain. Do not exceed 3 dose within 30 mins   Polyethyl Glycol-Propyl Glycol (SYSTANE OP) Place 1 drop into both eyes daily as needed (dry eyes).   promethazine (PHENERGAN) 25 MG tablet Take 1 tablet (25 mg total) by mouth every 6 (six) hours as needed for nausea or vomiting.   rosuvastatin (CRESTOR) 40 MG tablet Take 1 tablet (40 mg total) by mouth daily at 6 PM.   traMADol (ULTRAM) 50 MG tablet Take 50 mg by mouth every 6 (six) hours as needed.   traZODone (DESYREL) 100 MG tablet Take 1 to 2 tablets by mouth at bedtime   Ubrogepant (UBRELVY) 100 MG TABS Take 100 mg by mouth as needed (migraine). Max of 2 tablets daily   No facility-administered encounter medications on file as of 10/24/2020.    Care Gaps: Shingrix Vaccine Urine Microalbumin COVID-19 Vaccine Foot Exam Influenza Vaccine Star Rating Drugs: Rosuvastatin 40 mg last filled on 01/19/2020 for 90 day supply at [facility]. Medication Fill Gaps: clopidogrel  75 MG last filled 12/22/2019 90 day supply.  Called patient and discussed medication adherence  with patient,  no issues at this time with current medication.  Patient denies ED visit since her last CPP follow up.  Patient denies any side effects with her medication. Patient denies any problems with her current pharmacy     GAD 7 : Generalized Anxiety Score 08/22/2019  Nervous, Anxious, on Edge 3  Control/stop worrying 3  Worry too much - different things 3  Trouble relaxing 3  Restless 3  Easily annoyed or irritable 3  Afraid - awful might happen 3  Total GAD 7 Score 21  Anxiety Difficulty Very  difficult    GAD-7  Not at all (0) Several days (1) More than half the days (2) Nearly every day (3) Feeling nervous, anxious or on edge?- 3  Not being able to stop or control worrying.- 3  Worrying too much about different things?- 3  Trouble relaxing?- 1  Being so restless that it is hard to sit still?-3  Becoming easily annoyed or irritable?-3  Feeling afraid as if something awful might happen?-3  Anxiety Difficulty- Very Difficult GAD-7 Total - 22  Patient reports she takes  Wellbutrin 2 times daily and Xanax 2 times daily to help manage her anxiety.  Patient is asking if her PCP receive her message regarding  a letter she needs fill out saying she is handicap, so she can get her mailbox place on her house.Patient states she is afraid of falling, and getting hurt walking to her mailbox.   Inform patient I do see where she sent the message to Dr.Fisher, but he has not responded yet.Inform patient to give him until the end of the day and if she has not heard anything then she should call her PCP office tomorrow. Patient Verbalized understanding.  [place] Pharmacist Assistant [phone]

## 2020-10-27 ENCOUNTER — Other Ambulatory Visit: Payer: Self-pay | Admitting: Cardiovascular Disease

## 2020-10-30 ENCOUNTER — Other Ambulatory Visit: Payer: Self-pay

## 2020-10-30 ENCOUNTER — Ambulatory Visit (INDEPENDENT_AMBULATORY_CARE_PROVIDER_SITE_OTHER): Payer: Medicare Other

## 2020-10-30 ENCOUNTER — Telehealth: Payer: Self-pay

## 2020-10-30 DIAGNOSIS — Z0000 Encounter for general adult medical examination without abnormal findings: Secondary | ICD-10-CM | POA: Diagnosis not present

## 2020-10-30 NOTE — Progress Notes (Signed)
Per Clinical Pharmacist, Please reschedule patient telephone appointment on 11/15/2020.  Attempted to reach patient three times to reschedule patient telephone appointment on 11/15/2020.Left Voice message to have patient to return my call to reschedule her appointment.  [place] Pharmacist Assistant [phone]
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
## 2020-10-31 ENCOUNTER — Ambulatory Visit: Payer: Medicare Other | Admitting: Internal Medicine

## 2020-10-31 ENCOUNTER — Encounter: Payer: Self-pay | Admitting: Family Medicine

## 2020-11-04 ENCOUNTER — Ambulatory Visit (INDEPENDENT_AMBULATORY_CARE_PROVIDER_SITE_OTHER): Payer: Medicare Other | Admitting: Urology

## 2020-11-04 ENCOUNTER — Other Ambulatory Visit: Payer: Self-pay

## 2020-11-04 VITALS — BP 124/81 | HR 65

## 2020-11-04 DIAGNOSIS — N3946 Mixed incontinence: Secondary | ICD-10-CM | POA: Diagnosis not present

## 2020-11-04 DIAGNOSIS — R339 Retention of urine, unspecified: Secondary | ICD-10-CM | POA: Diagnosis not present

## 2020-11-04 LAB — BLADDER SCAN AMB NON-IMAGING: Scan Result: 369

## 2020-11-04 NOTE — Progress Notes (Addendum)
11/04/2020 9:51 AM   [place] May 28, 1970 [id number]  Referring provider: [name], MD [address] [place],  [place] [address]  Chief Complaint  Patient presents with   Incomplete bladder emptying    HPI: [name] is a 50 y.o. female with PMH stroke, diabetes, CKD, recurrent UTI, incomplete bladder emptying managed with CIC 4 times daily, and dysuria who presents today for follow-up on topical vaginal estrogen and prophylactic nitrofurantoin. Today she reports she continues to CIC 4 times daily, but sometimes she feels her bladder is full and she is unable to get any urinary return.  She feels she is advancing the catheter far enough and she denies meeting resistance with insertion.  She also reports bothersome urinary urgency, frequency, and nocturia x4.  She wears pull-ups around-the-clock for her ongoing urinary leakage and reports some foot on the floor phenomenon.  She is able to void spontaneously, and has been using the Crede maneuver to assist in voiding.  Today Patient has neurogenic bladder.  Normal CT scan December 2021 last culture negative.  She has been clean intermittent catheterization this year prescribed by  The patient leaks with coughing sneezing but not bending lifting.  Primary problem is urge incontinence.  She can leak without awareness.  She has high-volume bedwetting.  She wears 3 pads a day moderately wet  She has had multiple strokes.  She said she cannot voluntarily void but voids a small amount by pushing on her lower abdomen.  Her residuals are more than 8 ounces  Has had a uterine ablation.  She no longer is on her daily Macrodantin and was doing well on it.  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 08/2003 Cath: LAD 30-40-med Rx; b. 11/2014 PCI: LAD 11m(3.25x23 Xience Alpine DES); c. 06/2015 PCI: D1 ((1.91Y78Resolute Integrity DES); d. 06/2017 PCI: Patent mLAD stent, D2 95 (PTCA); e. 09/2017 PCI: D2 99ost (CBA); d. 12/2017 Cath: LM nl, LAD 366m80d (small), D1 40ost, D2 95ost, LCX 40p, RCA 40ost/p->Med rx for D2 given restenosis.   Colitis 06/03/2015   Colon polyp    CVA (cerebral vascular accident) (HCEpes   Left side weakness.    Degenerative tear of glenoid labrum of right shoulder 03/15/2017   Diabetes mellitus without complication ([facility]   Family history of breast cancer    BRCA neg 2014   Femur fracture, left ([facility]9/10/2018   Gastric ulcer 04/27/2011   History of echocardiogram    a. 03/2017 Echo: EF 60-65%, no rwma; b. 02/2018 Echo: EF 60-65%, no rwma. Nl RV fxn. No cardiac source of emboli (admitted w/ stroke).   Hypertension    Malignant melanoma of skin of scalp (HCC)    MI, acute, non ST segment elevation (HCC)    Neuromuscular disorder (HCC)    S/P drug eluting coronary stent placement 06/04/2015   Sepsis ([facility]/14/2019   Sepsis secondary to UTI ([facility]3/22/2022  Stroke [facility])    a. 02/2018 MRI: 62m late acute/early subacute L medial frontal lobe inarct; b. 02/2018 MRA No large vessel occlusion or aneurysm. Mod to sev L P2 stenosis. thready L vertebral artery, diffusely dzs'd; c. 02/2018 Carotid U/S: <50% bilat ICA dzs.    Surgical History: Past Surgical History:  Procedure Laterality Date   APPENDECTOMY     BALLOON ENTEROSCOPY  02/06/2020   [facility]   BIOPSY N/A 03/14/2020   Procedure: BIOPSY;  Surgeon: WLucilla Lame MD;  [facility]  Service: Endoscopy;  Laterality: N/A;   CARDIAC CATHETERIZATION N/A 11/09/2014   Procedure: Coronary  Angiography;  Surgeon: TMinna Merritts MD;  [facility];  Service: Cardiovascular;  Laterality: N/A;   CARDIAC CATHETERIZATION N/A 11/12/2014   Procedure: Coronary Stent Intervention;  Surgeon: AIsaias Cowman MD;  [place];  Service: Cardiovascular;  Laterality: N/A;   CARDIAC CATHETERIZATION N/A 04/18/2015   Procedure: Left Heart Cath and Coronary Angiography;  Surgeon: TMinna Merritts MD;  [place] [facility];  Service: Cardiovascular;  Laterality: N/A;   CARDIAC CATHETERIZATION Left 06/04/2015   Procedure: Left Heart Cath and Coronary Angiography;  Surgeon: MWellington Hampshire MD;  [place] [facility];  Service: Cardiovascular;  Laterality: Left;   CARDIAC CATHETERIZATION N/A 06/04/2015   Procedure: Coronary Stent Intervention;  Surgeon: MWellington Hampshire MD;  [place] [place] [place];  Service: Cardiovascular;  Laterality: N/A;   CESAREAN SECTION  2001   CHOLECYSTECTOMY N/A 11/18/2016   Procedure: LAPAROSCOPIC CHOLECYSTECTOMY WITH INTRAOPERATIVE CHOLANGIOGRAM;  Surgeon: SChristene Lye MD;  Location[facility];  Service: General;  Laterality: N/A;   COLONOSCOPY WITH PROPOFOL N/A 04/27/2016   Procedure: COLONOSCOPY WITH PROPOFOL;  Surgeon: DLucilla Lame MD;  [place]  Service: Endoscopy;  Laterality: N/A;   COLONOSCOPY WITH PROPOFOL N/A 01/12/2018   Procedure: COLONOSCOPY WITH PROPOFOL;  Surgeon: Toledo, TBenay Pike MD;  Location: [facility];  Service: Endoscopy;  Laterality: N/A;   COLONOSCOPY WITH PROPOFOL N/A 03/14/2020   Procedure: COLONOSCOPY WITH PROPOFOL;  Surgeon: WLucilla Lame MD;  [place]  Service: Endoscopy;  Laterality: N/A;  Needs to be scheduled after 7:30 due to driver issues   CORONARY ANGIOPLASTY     CORONARY BALLOON ANGIOPLASTY N/A 06/29/2017   Procedure: CORONARY BALLOON ANGIOPLASTY;  Surgeon: AWellington Hampshire MD;  [place] [facility];   Service: Cardiovascular;  Laterality: N/A;   CORONARY BALLOON ANGIOPLASTY N/A 09/20/2017   Procedure: CORONARY BALLOON ANGIOPLASTY;  Surgeon: AWellington Hampshire MD;  [place];  Service: Cardiovascular;  Laterality: N/A;   CORONARY STENT INTERVENTION N/A 12/13/2019   Procedure: CORONARY STENT INTERVENTION;  Surgeon: AWellington Hampshire MD;  [place] [place];  Service: Cardiovascular;  Laterality: N/A;   DILATION AND CURETTAGE OF UTERUS     ESOPHAGOGASTRODUODENOSCOPY (EGD) WITH PROPOFOL N/A 09/14/2014   Procedure: ESOPHAGOGASTRODUODENOSCOPY (EGD) WITH PROPOFOL;  Surgeon: MJosefine Class MD;  [facility]  Service: Endoscopy;  Laterality: N/A;   ESOPHAGOGASTRODUODENOSCOPY (EGD) WITH PROPOFOL N/A 04/27/2016   Procedure: ESOPHAGOGASTRODUODENOSCOPY (EGD) WITH PROPOFOL;  Surgeon: DLucilla Lame MD;  [place]  Service: Endoscopy;  Laterality: N/A;  Diabetic - oral meds   ESOPHAGOGASTRODUODENOSCOPY (EGD) WITH PROPOFOL N/A 01/12/2018   Procedure: ESOPHAGOGASTRODUODENOSCOPY (EGD) WITH PROPOFOL;  Surgeon: Toledo, TBenay Pike MD;  Location: [facility];  Service: Endoscopy;  Laterality: N/A;   ESOPHAGOGASTRODUODENOSCOPY (EGD) WITH PROPOFOL N/A 04/11/2019  Procedure: ESOPHAGOGASTRODUODENOSCOPY (EGD) WITH PROPOFOL;  Surgeon: [name], MD;  [facility] END[facility];  Service: Endoscopy;  Laterality: N/A;   ESOPHAGOGASTRODUODENOSCOPY (EGD) WITH PROPOFOL N/A 03/08/2020   Procedure: ESOPHAGOGASTRODUODENOSCOPY (EGD) WITH PROPOFOL;  Surgeon: [name], MD;  Location: [facility]COPY;  Service: Endoscopy;  Laterality: N/A;   GASTRIC BYPASS  09/2009   [facility]    INTRAMEDULLARY (IM) NAIL INTERTROCHANTERIC Left 10/13/2018   Procedure: INTRAMEDULLARY (IM) NAIL INTERTROCHANTRIC;  Surgeon: Leandrew Koyanagi, MD;  Location: [place];  Service: Orthopedics;  Laterality: Left;   Left Carotid to sublcavian artery bypass w/ subclavian artery  ligation     a. Performed @ [facility].   LEFT HEART CATH AND CORONARY ANGIOGRAPHY Left 06/29/2017   Procedure: LEFT HEART CATH AND CORONARY ANGIOGRAPHY;  Surgeon: Wellington Hampshire, MD;  Location: [place] [facility] LAB;  Service: Cardiovascular;  Laterality: Left;   LEFT HEART CATH AND CORONARY ANGIOGRAPHY N/A 09/20/2017   Procedure: LEFT HEART CATH AND CORONARY ANGIOGRAPHY;  Surgeon: Wellington Hampshire, MD;  [place] [facility];  Service: Cardiovascular;  Laterality: N/A;   LEFT HEART CATH AND CORONARY ANGIOGRAPHY N/A 12/20/2017   Procedure: LEFT HEART CATH AND CORONARY ANGIOGRAPHY;  Surgeon: Wellington Hampshire, MD;  Location: [place] [facility] LAB;  Service: Cardiovascular;  Laterality: N/A;   LEFT HEART CATH AND CORONARY ANGIOGRAPHY N/A 04/20/2019   Procedure: LEFT HEART CATH AND CORONARY ANGIOGRAPHY possible PCI;  Surgeon: Minna Merritts, MD;  Location: [place] [facility] LAB;  Service: Cardiovascular;  Laterality: N/A;   LEFT HEART CATH AND CORONARY ANGIOGRAPHY N/A 12/13/2019   Procedure: LEFT HEART CATH AND CORONARY ANGIOGRAPHY;  Surgeon: Wellington Hampshire, MD;  Location: [place] [facility] LAB;  Service: Cardiovascular;  Laterality: N/A;   MELANOMA EXCISION  2016   Dr. Henreitta Cea ABLATION  2002   RIGHT OOPHORECTOMY     SHOULDER ARTHROSCOPY WITH OPEN ROTATOR CUFF REPAIR Right 01/07/2016   Procedure: SHOULDER ARTHROSCOPY WITH DEBRIDMENT, SUBACHROMIAL DECOMPRESSION;  Surgeon: Corky Mull, MD;  Location: [facility];  Service: Orthopedics;  Laterality: Right;   SHOULDER ARTHROSCOPY WITH OPEN ROTATOR CUFF REPAIR Right 03/16/2017   Procedure: SHOULDER ARTHROSCOPY WITH OPEN ROTATOR CUFF REPAIR POSSIBLE BICEPS TENODESIS;  Surgeon: Corky Mull, MD;  Location: [facility];  Service: Orthopedics;  Laterality: Right;   TRIGGER FINGER RELEASE Right     Middle Finger    Home Medications:  Allergies as of 11/04/2020       Reactions   Lipitor [atorvastatin] Other (See Comments)   Leg pains         Medication List        Accurate as of November 04, 2020  9:51 AM. If you have any questions, ask your nurse or doctor.          Aimovig 140 MG/ML Soaj Generic drug: Erenumab-aooe Inject into the skin.   ALPRAZolam 1 MG tablet Commonly known as: XANAX TAKE 1 TABLET BY MOUTH TWICE DAILY   amLODipine 2.5 MG tablet Commonly known as: NORVASC Take 2.5 mg by mouth daily.   Baqsimi One Pack 3 MG/DOSE Powd Generic drug: Glucagon Place 1 spray into both nostrils as directed.   buPROPion ER 100 MG 12 hr tablet Commonly known as: WELLBUTRIN SR Take 100 mg by mouth 2 (two) times daily.   carvedilol 12.5 MG tablet Commonly known as: COREG Take 1 tablet by mouth twice daily   clopidogrel 75 MG tablet Commonly known as: PLAVIX Take 75  mg by mouth daily.   desvenlafaxine 50 MG 24 hr tablet Commonly known as: PRISTIQ Take 50 mg by mouth daily.   Desvenlafaxine ER 50 MG Tb24 Commonly known as: PRISTIQ Take 1 tablet (50 mg total) by mouth every morning.   diclofenac Sodium 1 % Gel Commonly known as: VOLTAREN Apply 2 g topically 4 (four) times daily.   diphenoxylate-atropine 2.5-0.025 MG tablet Commonly known as: Lomotil Take 1 tablet by mouth 4 (four) times daily as needed for diarrhea or loose stools.   estradiol 0.1 MG/GM vaginal cream Commonly known as: ESTRACE Estrogen Cream Instruction Discard applicator Apply pea sized amount to tip of finger to urethra before bed. Wash hands well after application. Use Monday, Wednesday and Friday   gabapentin 300 MG capsule Commonly known as: NEURONTIN Take 2 capsules (600 mg total) by mouth 2 (two) times daily.   glucose 4 GM chewable tablet Chew 1 tablet (4 g total) by mouth 3 (three) times daily.   isosorbide mononitrate 120 MG 24 hr tablet Commonly known as: IMDUR Take 1 tablet by mouth every day   levothyroxine 25 MCG tablet Commonly known as: SYNTHROID Take 1 tablet by mouth once daily before breakfast    memantine 5 MG tablet Commonly known as: NAMENDA Take 5 mg by mouth 2 (two) times daily.   nitrofurantoin (macrocrystal-monohydrate) 100 MG capsule Commonly known as: MACROBID Take 1 capsule (100 mg total) by mouth daily. For 3 months.   nitroGLYCERIN 0.4 MG SL tablet Commonly known as: NITROSTAT Place 1 tablet (0.4 mg total) under the tongue every 5 (five) minutes as needed for chest pain. Do not exceed 3 dose within 30 mins   promethazine 25 MG tablet Commonly known as: PHENERGAN Take 1 tablet (25 mg total) by mouth every 6 (six) hours as needed for nausea or vomiting.   rosuvastatin 40 MG tablet Commonly known as: CRESTOR Take 1 tablet (40 mg total) by mouth daily at 6 PM.   SYSTANE OP Place 1 drop into both eyes daily as needed ([name] eyes).   traMADol 50 MG tablet Commonly known as: ULTRAM Take 50 mg by mouth every 6 (six) hours as needed.   traZODone 100 MG tablet Commonly known as: DESYREL Take 1 to 2 tablets by mouth at bedtime   Ubrelvy 100 MG Tabs Generic drug: Ubrogepant Take 100 mg by mouth as needed (migraine). Max of 2 tablets daily        Allergies:  Allergies  Allergen Reactions   Lipitor [Atorvastatin] Other (See Comments)    Leg pains    Family History: Family History  Problem Relation Age of Onset   Hypertension Mother    Anxiety disorder Mother    Depression Mother    Bipolar disorder Mother    Heart disease Mother        No details   Hyperlipidemia Mother    Kidney disease Father    Heart disease Father 44   Hypertension Father    Diabetes Father    Stroke Father    Colon cancer Father        dx in his 47's   Anxiety disorder Father    Depression Father    Skin cancer Father    Kidney disease Sister    Thyroid nodules Sister    Hypertension Sister    Hypertension Sister    Diabetes Sister    Hyperlipidemia Sister    Depression Sister    Breast cancer Maternal Aunt 26   Breast cancer  Maternal Aunt 50   Ovarian cancer  Cousin    Colon cancer Cousin    Kidney cancer Cousin    Breast cancer Other    Bladder Cancer Neg Hx     Social History:  reports that she quit smoking about 26 years ago. Her smoking use included cigarettes. She has a 0.25 pack-year smoking history. She has never used smokeless tobacco. She reports that she does not drink alcohol and does not use drugs.  ROS:                                        Physical Exam: There were no vitals taken for this visit.   Laboratory Data: Lab Results  Component Value Date   WBC 8.1 08/20/2020   HGB 10.8 (L) 08/20/2020   HCT 34.6 (L) 08/20/2020   MCV 79.5 (L) 08/20/2020   PLT 174 08/20/2020    Lab Results  Component Value Date   CREATININE 1.69 (H) 08/09/2020    No results found for: PSA  Lab Results  Component Value Date   TESTOSTERONE 37 07/10/2016    Lab Results  Component Value Date   HGBA1C 5.3 08/08/2020    Urinalysis    Component Value Date/Time   COLORURINE YELLOW (A) 08/07/2020 1620   APPEARANCEUR Hazy (A) 10/04/2020 1024   LABSPEC 1.006 08/07/2020 1620   LABSPEC 1.012 12/29/2013 1210   PHURINE 5.0 08/07/2020 1620   GLUCOSEU Negative 10/04/2020 1024   GLUCOSEU >=500 12/29/2013 1210   HGBUR SMALL (A) 08/07/2020 1620   HGBUR negative 04/14/2010 1435   BILIRUBINUR Negative 10/04/2020 1024   BILIRUBINUR Negative 12/29/2013 1210   KETONESUR NEGATIVE 08/07/2020 1620   PROTEINUR Negative 10/04/2020 1024   PROTEINUR 30 (A) 08/07/2020 1620   UROBILINOGEN 0.2 04/18/2020 1326   UROBILINOGEN negative 04/14/2010 1435   NITRITE Negative 10/04/2020 1024   NITRITE POSITIVE (A) 08/07/2020 1620   LEUKOCYTESUR Negative 10/04/2020 1024   LEUKOCYTESUR LARGE (A) 08/07/2020 1620   LEUKOCYTESUR Negative 12/29/2013 1210    Pertinent Imaging:   Assessment & Plan: Patient has a neurogenic bladder.  She cannot voluntarily void by history.  She will return with urodynamics and for cystoscopy.  I will also  double check her urethral anatomy.  Patient clinically not infected and understands the concept of colonization.  If culture is positive I will treat it prior to the urodynamic  I forgot to tell the patient about future cystoscopy and will arrange this in the future  1. Incomplete bladder emptying  - BLADDER SCAN AMB NON-IMAGING   No follow-ups on file.  Reece Packer, MD  [place] [address], [place], [place] [address] [phone]

## 2020-11-04 NOTE — Progress Notes (Signed)
I connected with  [place] on 10/30/2020 by a video enabled telemedicine application and verified that I am speaking with the correct person using two identifiers.   I discussed the limitations of evaluation and management by telemedicine. The patient expressed understanding and agreed to proceed.   Location of patient: Home Location of provider:  office  [place], CMA

## 2020-11-05 LAB — URINALYSIS, COMPLETE
Bilirubin, UA: NEGATIVE
Glucose, UA: NEGATIVE
Ketones, UA: NEGATIVE
Nitrite, UA: POSITIVE — AB
Protein,UA: NEGATIVE
RBC, UA: NEGATIVE
Specific Gravity, UA: 1.01 (ref 1.005–1.030)
Urobilinogen, Ur: 0.2 mg/dL (ref 0.2–1.0)
pH, UA: 5.5 (ref 5.0–7.5)

## 2020-11-05 LAB — MICROSCOPIC EXAMINATION: RBC, Urine: NONE SEEN /hpf (ref 0–2)

## 2020-11-06 ENCOUNTER — Encounter: Payer: Self-pay | Admitting: Family Medicine

## 2020-11-06 ENCOUNTER — Other Ambulatory Visit: Payer: Self-pay

## 2020-11-06 DIAGNOSIS — N1832 Chronic kidney disease, stage 3b: Secondary | ICD-10-CM

## 2020-11-06 DIAGNOSIS — N3946 Mixed incontinence: Secondary | ICD-10-CM

## 2020-11-06 DIAGNOSIS — E1122 Type 2 diabetes mellitus with diabetic chronic kidney disease: Secondary | ICD-10-CM

## 2020-11-06 DIAGNOSIS — R339 Retention of urine, unspecified: Secondary | ICD-10-CM

## 2020-11-06 DIAGNOSIS — E162 Hypoglycemia, unspecified: Secondary | ICD-10-CM

## 2020-11-08 LAB — CULTURE, URINE COMPREHENSIVE

## 2020-11-13 ENCOUNTER — Other Ambulatory Visit: Payer: Self-pay

## 2020-11-13 ENCOUNTER — Ambulatory Visit (INDEPENDENT_AMBULATORY_CARE_PROVIDER_SITE_OTHER): Payer: Medicare Other

## 2020-11-13 DIAGNOSIS — Z23 Encounter for immunization: Secondary | ICD-10-CM | POA: Diagnosis not present

## 2020-11-15 ENCOUNTER — Telehealth: Payer: Self-pay

## 2020-11-18 ENCOUNTER — Ambulatory Visit: Payer: Medicare Other | Admitting: Cardiovascular Disease

## 2020-11-19 ENCOUNTER — Ambulatory Visit: Payer: Medicare Other | Admitting: Cardiovascular Disease

## 2020-11-19 NOTE — Progress Notes (Signed)
Cardiology Office Note:    Date:  11/21/2020   ID:  [address] [place], [place] Apr 08, 1970, MRN [id number]  PCP:  [name], MD  [facility] Cardiologist:  [name], MD  [facility] Electrophysiologist:  None   Referring MD: [name], MD   Chief Complaint: 6 month follow-up  History of Present Illness:    [name] is a 50 y.o. female with a hx CAD s/p PCI to LAD with DES, HFpEF, gr1DD, CKD3, prior CVA, left carotid to subclavian artery bypass, aortic arch aneurysm, anemia, obesity s/p gastric bypass, bipolar disorder, DM2, HTN, HLD, frequent falls with syncope, GIB 01/2020   She presented to the ED 12/12/2019 with worsening of chronic chest pain for 5 days prior to admission.  She was scheduled for [facility] 12/13/2019 however felt she needed to be seen in the ED.  Initial cardiac work-up unremarkable, negative troponin, no EKG changes.  She was hydrated prior to procedure and taken to Cath Lab 12/13/2019 by Dr. Kirke Corin.  LHC with 9% stenosis of mid to distal LAD s/p successful angioplasty and DES.  Additional lesion in mid LAD/second diagonal bifurcation disease not felt to be critical and will require bifurcation stenting.  Dr. Kirke Corin decided to treat severe disease due to underlying CKD.   Recommended for indefinite DAPT.  Her Eliquis was discontinued as VQ scan very questionable and small with subsequent CTA of pulmonary arteries with no evidence of thrombus.  She was hospitazlied 01/2021 - 02/2020 for UGI.   Pt was admitted in 08/2020 for stroke. CT head non acute. MRI head showed no acute abnormality, severe chronic small vessel ischemic disease with multiple lacunar infarcts. Last seen 05/21/20 and was stable from a cardiac perspective. She has since don therapy and has no residual weakness. Driving again.   Echo 08/08/2020 showed LVEF 60-65%, no Wma, G1DD.   Today, the patient reports she has been having ankle swelling. It is worse at the end of the day.  Sometimes swelling in the morning. Does not use compression stockings. Doesn't eat salt. She elevates her feet. She has been on lasix in the past. Has occasional chest pain, occurred last month. This is stable. Has unchanged shortness of breath. She has chronic orthopnea. Not smoker. Follows with nephrology for CKD. Sees GI for diarrhea. No further bleeding on plavix.    Past Medical History:  Diagnosis Date   Acute colitis 01/27/2017   Acute pyelonephritis    Acute upper GI bleed 01/25/2020   Anemia    iron deficiency anemia   Aortic arch aneurysm    BRCA negative 2014   CAD (coronary artery disease)    a. 08/2003 Cath: LAD 30-40-med Rx; b. 11/2014 PCI: LAD 44m (3.25x23 Xience Alpine DES); c. 06/2015 PCI: D1 (2.25x12 Resolute Integrity DES); d. 06/2017 PCI: Patent mLAD stent, D2 95 (PTCA); e. 09/2017 PCI: D2 99ost (CBA); d. 12/2017 Cath: LM nl, LAD 53m, 80d (small), D1 40ost, D2 95ost, LCX 40p, RCA 40ost/p->Med rx for D2 given restenosis.   Colitis 06/03/2015   Colon polyp    CVA (cerebral vascular accident) (HCC)    Left side weakness.    Degenerative tear of glenoid labrum of right shoulder 03/15/2017   Diabetes mellitus without complication [facility])    Family history of breast cancer    BRCA neg 2014   Femur fracture, left (HCC) 10/12/2018   Gastric ulcer 04/27/2011   History of echocardiogram    a. 03/2017 Echo: EF 60-65%, no  rwma; b. 02/2018 Echo: EF 60-65%, no rwma. Nl RV fxn. No cardiac source of emboli (admitted w/ stroke).   Hypertension    Malignant melanoma of skin of scalp (HCC)    MI, acute, non ST segment elevation (HCC)    Neuromuscular disorder (HCC)    S/P drug eluting coronary stent placement 06/04/2015   Sepsis (HCC) 03/18/2017   Sepsis secondary to UTI (HCC) 04/23/2020   Stroke (HCC)    a. 02/2018 MRI: 7mm late acute/early subacute L medial frontal lobe inarct; b. 02/2018 MRA No large vessel occlusion or aneurysm. Mod to sev L P2 stenosis. thready L vertebral artery, diffusely  dzs'd; c. 02/2018 Carotid U/S: <50% bilat ICA dzs.    Past Surgical History:  Procedure Laterality Date   APPENDECTOMY     BALLOON ENTEROSCOPY  02/06/2020   [facility]   BIOPSY N/A 03/14/2020   Procedure: BIOPSY;  Surgeon: Midge Minium, MD;  Location: [facility];  Service: Endoscopy;  Laterality: N/A;   CARDIAC CATHETERIZATION N/A 11/09/2014   Procedure: Coronary Angiography;  Surgeon: Antonieta Iba, MD;  Location: [facility] INVASIVE CV LAB;  Service: Cardiovascular;  Laterality: N/A;   CARDIAC CATHETERIZATION N/A 11/12/2014   Procedure: Coronary Stent Intervention;  Surgeon: Marcina Millard, MD;  Location: [facility] INVASIVE CV LAB;  Service: Cardiovascular;  Laterality: N/A;   CARDIAC CATHETERIZATION N/A 04/18/2015   Procedure: Left Heart Cath and Coronary Angiography;  Surgeon: Antonieta Iba, MD;  Location: [facility] INVASIVE CV LAB;  Service: Cardiovascular;  Laterality: N/A;   CARDIAC CATHETERIZATION Left 06/04/2015   Procedure: Left Heart Cath and Coronary Angiography;  Surgeon: Iran Ouch, MD;  Location: [facility] INV[facility] CV LAB;  Service: Cardiovascular;  Laterality: Left;   CARDIAC CATHETERIZATION N/A 06/04/2015   Procedure: Coronary Stent Intervention;  Surgeon: Iran Ouch, MD;  Location: [facility] CV LAB;  Service: Cardiovascular;  Laterality: N/A;   CESAREAN SECTION  2001   CHOLECYSTECTOMY N/A 11/18/2016   Procedure: LAPAROSCOPIC CHOLECYSTECTOMY WITH INTRAOPERATIVE CHOLANGIOGRAM;  Surgeon: Kieth Brightly, MD;  Location: [facility] ORS;  Service: General;  Laterality: N/A;   COLONOSCOPY WITH PROPOFOL N/A 04/27/2016   Procedure: COLONOSCOPY WITH PROPOFOL;  Surgeon: Midge Minium, MD;  Location: [facility];  Service: Endoscopy;  Laterality: N/A;   COLONOSCOPY WITH PROPOFOL N/A 01/12/2018   Procedure: COLONOSCOPY WITH PROPOFOL;  Surgeon: Toledo, Boykin Nearing, MD;  Location: [facility] ENDOSCOPY;  Service: Endoscopy;  Laterality: N/A;   COLONOSCOPY WITH PROPOFOL N/A 03/14/2020    Procedure: COLONOSCOPY WITH PROPOFOL;  Surgeon: Midge Minium, MD;  Location: [facility];  Service: Endoscopy;  Laterality: N/A;  Needs to be scheduled after 7:30 due to driver issues   CORONARY ANGIOPLASTY     CORONARY BALLOON ANGIOPLASTY N/A 06/29/2017   Procedure: CORONARY BALLOON ANGIOPLASTY;  Surgeon: Iran Ouch, MD;  Location: [facility] CV LAB;  Service: Cardiovascular;  Laterality: N/A;   CORONARY BALLOON ANGIOPLASTY N/A 09/20/2017   Procedure: CORONARY BALLOON ANGIOPLASTY;  Surgeon: Iran Ouch, MD;  Location: [facility];  Service: Cardiovascular;  Laterality: N/A;   CORONARY STENT INTERVENTION N/A 12/13/2019   Procedure: CORONARY STENT INTERVENTION;  Surgeon: Iran Ouch, MD;  Location: [facility] CV [facility];  Service: Cardiovascular;  Laterality: N/A;   DILATION AND CURETTAGE OF UTERUS     ESOPHAGOGASTRODUODENOSCOPY (EGD) WITH PROPOFOL N/A 09/14/2014   Procedure: ESOPHAGOGASTRODUODENOSCOPY (EGD) WITH PROPOFOL;  Surgeon: Elnita Maxwell, MD;  Location: [facility] ENDOSCOPY;  Service: Endoscopy;  Laterality: N/A;   ESOPHAGOGASTRODUODENOSCOPY (EGD)  WITH PROPOFOL N/A 04/27/2016   Procedure: ESOPHAGOGASTRODUODENOSCOPY (EGD) WITH PROPOFOL;  Surgeon: Midge Minium, MD;  Location: [facility];  Service: Endoscopy;  Laterality: N/A;  Diabetic - oral meds   ESOPHAGOGASTRODUODENOSCOPY (EGD) WITH PROPOFOL N/A 01/12/2018   Procedure: ESOPHAGOGASTRODUODENOSCOPY (EGD) WITH PROPOFOL;  Surgeon: Toledo, Boykin Nearing, MD;  Location: [facility] ENDOSCOPY;  Service: Endoscopy;  Laterality: N/A;   ESOPHAGOGASTRODUODENOSCOPY (EGD) WITH PROPOFOL N/A 04/11/2019   Procedure: ESOPHAGOGASTRODUODENOSCOPY (EGD) WITH PROPOFOL;  Surgeon: Midge Minium, MD;  Location: [facility] ENDOSCOPY;  Service: Endoscopy;  Laterality: N/A;   ESOPHAGOGASTRODUODENOSCOPY (EGD) WITH PROPOFOL N/A 03/08/2020   Procedure: ESOPHAGOGASTRODUODENOSCOPY (EGD) WITH PROPOFOL;  Surgeon: Midge Minium, MD;  Location: [facility]  ENDOSCOPY;  Service: Endoscopy;  Laterality: N/A;   GASTRIC BYPASS  09/2009   [facility]    INTRAMEDULLARY (IM) NAIL INTERTROCHANTERIC Left 10/13/2018   Procedure: INTRAMEDULLARY (IM) NAIL INTERTROCHANTRIC;  Surgeon: Tarry Kos, MD;  Location: [facility] OR;  Service: Orthopedics;  Laterality: Left;   Left Carotid to sublcavian artery bypass w/ subclavian artery ligation     a. Performed @ [facility].   LEFT HEART CATH AND CORONARY ANGIOGRAPHY Left 06/29/2017   Procedure: LEFT HEART CATH AND CORONARY ANGIOGRAPHY;  Surgeon: Iran Ouch, MD;  Location: [facility] CV [facility];  Service: Cardiovascular;  Laterality: Left;   LEFT HEART CATH AND CORONARY ANGIOGRAPHY N/A 09/20/2017   Procedure: LEFT HEART CATH AND CORONARY ANGIOGRAPHY;  Surgeon: Iran Ouch, MD;  Location: [facility];  Service: Cardiovascular;  Laterality: N/A;   LEFT HEART CATH AND CORONARY ANGIOGRAPHY N/A 12/20/2017   Procedure: LEFT HEART CATH AND CORONARY ANGIOGRAPHY;  Surgeon: Iran Ouch, MD;  Location: [facility];  Service: Cardiovascular;  Laterality: N/A;   LEFT HEART CATH AND CORONARY ANGIOGRAPHY N/A 04/20/2019   Procedure: LEFT HEART CATH AND CORONARY ANGIOGRAPHY possible PCI;  Surgeon: Antonieta Iba, MD;  Location: [facility];  Service: Cardiovascular;  Laterality: N/A;   LEFT HEART CATH AND CORONARY ANGIOGRAPHY N/A 12/13/2019   Procedure: LEFT HEART CATH AND CORONARY ANGIOGRAPHY;  Surgeon: Iran Ouch, MD;  Location: [facility];  Service: Cardiovascular;  Laterality: N/A;   MELANOMA EXCISION  2016   Dr. Marinell Blight ABLATION  2002   RIGHT OOPHORECTOMY     SHOULDER ARTHROSCOPY WITH OPEN ROTATOR CUFF REPAIR Right 01/07/2016   Procedure: SHOULDER ARTHROSCOPY WITH DEBRIDMENT, SUBACHROMIAL DECOMPRESSION;  Surgeon: Christena Flake, MD;  Location: [facility] ORS;  Service: Orthopedics;  Laterality: Right;   SHOULDER ARTHROSCOPY WITH OPEN ROTATOR CUFF REPAIR Right  03/16/2017   Procedure: SHOULDER ARTHROSCOPY WITH OPEN ROTATOR CUFF REPAIR POSSIBLE BICEPS TENODESIS;  Surgeon: Christena Flake, MD;  Location: [facility] ORS;  Service: Orthopedics;  Laterality: Right;   TRIGGER FINGER RELEASE Right     Middle Finger    Current Medications: Current Meds  Medication Sig   AIMOVIG 140 MG/ML SOAJ Inject into the skin.   ALPRAZolam (XANAX) 1 MG tablet TAKE 1 TABLET BY MOUTH TWICE DAILY   amLODipine (NORVASC) 2.5 MG tablet Take 2.5 mg by mouth daily.   BAQSIMI ONE PACK 3 MG/DOSE POWD Place 1 spray into both nostrils as directed.   buPROPion (WELLBUTRIN SR) 100 MG 12 hr tablet Take 100 mg by mouth 2 (two) times daily.   carvedilol (COREG) 12.5 MG tablet Take 1 tablet by mouth twice daily   Desvenlafaxine ER (PRISTIQ) 50 MG TB24 Take 1 tablet (50 mg total) by mouth every morning.  diclofenac Sodium (VOLTAREN) 1 % GEL Apply 2 g topically 4 (four) times daily.   diphenoxylate-atropine (LOMOTIL) 2.5-0.025 MG tablet Take 1 tablet by mouth 4 (four) times daily as needed for diarrhea or loose stools.   estradiol (ESTRACE) 0.1 MG/GM vaginal cream Estrogen Cream Instruction Discard applicator Apply pea sized amount to tip of finger to urethra before bed. Wash hands well after application. Use Monday, Wednesday and Friday   furosemide (LASIX) 20 MG tablet Take 1 tablet (20 mg total) by mouth as needed (As needed for swelling).   gabapentin (NEURONTIN) 300 MG capsule Take 2 capsules (600 mg total) by mouth 2 (two) times daily.   glucose 4 GM chewable tablet Chew 1 tablet (4 g total) by mouth 3 (three) times daily.   isosorbide mononitrate (IMDUR) 120 MG 24 hr tablet Take 1 tablet by mouth every day   levothyroxine (SYNTHROID) 25 MCG tablet Take 1 tablet by mouth once daily before breakfast   memantine (NAMENDA) 5 MG tablet Take 5 mg by mouth 2 (two) times daily.   nitroGLYCERIN (NITROSTAT) 0.4 MG SL tablet Place 1 tablet (0.4 mg total) under the tongue every 5 (five) minutes as  needed for chest pain. Do not exceed 3 dose within 30 mins   Polyethyl Glycol-Propyl Glycol (SYSTANE OP) Place 1 drop into both eyes daily as needed (dry eyes).   promethazine (PHENERGAN) 25 MG tablet Take 1 tablet (25 mg total) by mouth every 6 (six) hours as needed for nausea or vomiting.   rosuvastatin (CRESTOR) 40 MG tablet Take 1 tablet (40 mg total) by mouth daily at 6 PM.   traZODone (DESYREL) 100 MG tablet Take 1 to 2 tablets by mouth at bedtime   [DISCONTINUED] clopidogrel (PLAVIX) 75 MG tablet Take 75 mg by mouth daily.     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 Disabled through 08/02/2013 per forms 02/03/2013  Tobacco Use   Smoking status: Former    Packs/day: 0.25    Years: 1.00    Pack years: 0.25    Types: Cigarettes    Quit date: 08/31/1994    Years since quitting: 26.2   Smokeless tobacco: Never   Tobacco comments:    quit 28 years ago  Vaping Use   Vaping Use: Never used  Substance and Sexual Activity   Alcohol use: No    Alcohol/week: 0.0 standard drinks   Drug use: No   Sexual activity: Not Currently    Birth control/protection: None, Surgical    Comment: Ablation  Other Topics Concern   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   Lives at home with son   Social Determinants of Health   Financial Resource Strain: Low Risk    Difficulty of Paying Living Expenses: Not hard at all  Food Insecurity: No Food Insecurity   Worried About Programme researcher, broadcasting/film/video in the Last Year: Never true   Barista in the Last  Year: Never true  Transportation Needs: No Transportation  Needs   Lack of Transportation (Medical): No   Lack of Transportation (Non-Medical): No  Physical Activity: Not on file  Stress: Stress Concern Present   Feeling of Stress : Rather much  Social Connections: Moderately Integrated   Frequency of Communication with Friends and Family: More than three times a week   Frequency of Social Gatherings with Friends and Family: Twice a week   Attends Religious Services: More than 4 times per year   Active Member of [employer] or Organizations: Yes   Attends Engineer, structural: More than 4 times per year   Marital Status: Divorced     Family History: The patient's family history includes Anxiety disorder in her father and mother; Bipolar disorder in her mother; Breast cancer in an other family member; Breast cancer (age of onset: 48) in her maternal aunt and maternal aunt; Colon cancer in her cousin and father; Depression in her father, mother, and sister; Diabetes in her father and sister; Heart disease in her mother; Heart disease (age of onset: 59) in her father; Hyperlipidemia in her mother and sister; Hypertension in her father, mother, sister, and sister; Kidney cancer in her cousin; Kidney disease in her father and sister; Ovarian cancer in her cousin; Skin cancer in her father; Stroke in her father; Thyroid nodules in her sister. There is no history of Bladder Cancer.  ROS:   Please see the history of present illness.     All other systems reviewed and are negative.  EKGs/Labs/Other Studies Reviewed:    The following studies were reviewed today:   EKG:  EKG is  ordered today.  The ekg ordered today demonstrates NSR 60bpm, nonspecific T wave flattening  Recent Labs: 01/09/2020: BNP 64.6 03/09/2020: Magnesium 1.8 04/23/2020: ALT 23; TSH 1.600 08/09/2020: BUN 22; Creatinine, Ser 1.69; Potassium 5.0; Sodium 137 08/20/2020: Hemoglobin 10.8; Platelets 174  Recent Lipid Panel     Component Value Date/Time   CHOL 180 08/08/2020 0601   CHOL 159 05/28/2016 1212   CHOL 197 12/30/2013 0355   TRIG 118 08/08/2020 0601   TRIG 261 (H) 12/30/2013 0355   HDL 35 (L) 08/08/2020 0601   HDL 34 (L) 05/28/2016 1212   HDL 28 (L) 12/30/2013 0355   CHOLHDL 5.1 08/08/2020 0601   VLDL 24 08/08/2020 0601   VLDL 52 (H) 12/30/2013 0355   LDLCALC 121 (H) 08/08/2020 0601   LDLCALC 103 (H) 12/08/2016 1221   LDLCALC 117 (H) 12/30/2013 0355   LDLDIRECT 83.3 01/26/2011 0913     Physical Exam:    VS:  BP 132/62 (BP Location: Left Arm, Patient Position: Sitting, Cuff Size: Normal)   Pulse 60   Ht 5\' 3"  (1.6 m)   Wt 157 lb (71.2 kg)   SpO2 93%   BMI 27.81 kg/m     Wt Readings from Last 3 Encounters:  11/20/20 157 lb (71.2 kg)  10/04/20 150 lb (68 kg)  08/28/20 153 lb (69.4 kg)     GEN:  Well nourished, well developed in no acute distress HEENT: Normal NECK: No JVD; No carotid bruits LYMPHATICS: No lymphadenopathy CARDIAC: RRR, no murmurs, rubs, gallops RESPIRATORY:  Clear to auscultation without rales, wheezing or rhonchi  ABDOMEN: Soft, non-tender, non-distended MUSCULOSKELETAL:  No edema; No deformity  SKIN: Warm and dry NEUROLOGIC:  Alert and oriented x 3 PSYCHIATRIC:  Normal affect   ASSESSMENT:    1. Coronary artery disease of native artery of native heart with stable angina pectoris (HCC)   2. Chronic systolic  CHF (congestive heart failure) (HCC)   3. Essential hypertension   4. Hyperlipidemia, mixed   5. History of stroke   6. Stage 3 chronic kidney disease, unspecified whether stage 3a or 3b CKD (HCC)    PLAN:    In order of problems listed above:  CAD s/p DES to m-dLAD with stable angina Reports occasional angina, this is unchanged. EKG today is stable. Discussed option of Ranexa, but patient is not wanting another medication. Continue ASA, Plavix, Crestor, Imdur, Coreg. No further work-up at this time.   HFpEF Patient reports intermittent lower leg  edema, she has been on lasix in the past. Low salt diet, compression socks, and leg elevation encouraged. She is euvolemic on exam today. I will give her Lasix 20mg  to use as needed. She also follows with nephrology for CKD, she sees him next week. Continue Coreg.  HTN BP good. Continue current medications  H/o GIB No further bleeding. Follows with GI for diarrhea  HLD LDL 27 2019. Continue Crestor 40mg  daily.   Possible stroke Most recent was in 08/2020, unsure if it was a stroke.  MRI head showed no acute abnormality, severe chronic small vessel ischemic disease with multiple lacunar infarcts .She follows with neurology. No residual weakness after PT.   Disposition: Follow up in 6 month(s) with MD/APP     Signed, Evonda Enge David Stall, PA-C  11/21/2020 10:18 AM    [facility]

## 2020-11-20 ENCOUNTER — Encounter: Payer: Self-pay | Admitting: Medical

## 2020-11-20 ENCOUNTER — Other Ambulatory Visit: Payer: Self-pay

## 2020-11-20 ENCOUNTER — Ambulatory Visit (INDEPENDENT_AMBULATORY_CARE_PROVIDER_SITE_OTHER): Payer: Medicare Other | Admitting: Medical

## 2020-11-20 ENCOUNTER — Encounter: Payer: Self-pay | Admitting: Urology

## 2020-11-20 VITALS — BP 132/62 | HR 60 | Ht 63.0 in | Wt 157.0 lb

## 2020-11-20 DIAGNOSIS — I10 Essential (primary) hypertension: Secondary | ICD-10-CM | POA: Diagnosis not present

## 2020-11-20 DIAGNOSIS — I25118 Atherosclerotic heart disease of native coronary artery with other forms of angina pectoris: Secondary | ICD-10-CM

## 2020-11-20 DIAGNOSIS — E782 Mixed hyperlipidemia: Secondary | ICD-10-CM | POA: Diagnosis not present

## 2020-11-20 DIAGNOSIS — Z8673 Personal history of transient ischemic attack (TIA), and cerebral infarction without residual deficits: Secondary | ICD-10-CM

## 2020-11-20 DIAGNOSIS — I5022 Chronic systolic (congestive) heart failure: Secondary | ICD-10-CM | POA: Diagnosis not present

## 2020-11-20 DIAGNOSIS — N183 Chronic kidney disease, stage 3 unspecified: Secondary | ICD-10-CM

## 2020-11-20 MED ORDER — FUROSEMIDE 20 MG PO TABS
20.0000 mg | ORAL_TABLET | ORAL | 0 refills | Status: DC | PRN
Start: 2020-11-20 — End: 2020-12-13

## 2020-11-20 MED ORDER — CLOPIDOGREL BISULFATE 75 MG PO TABS: 75.0000 mg | ORAL_TABLET | Freq: Every day | ORAL | 3 refills | Status: AC

## 2020-11-20 NOTE — Patient Instructions (Signed)
Medication Instructions:  Your physician has recommended you make the following change in your medication:   AS NEEDED Furosemide 20 mg for swelling  *If you need a refill on your cardiac medications before your next appointment, please call your pharmacy*   Lab Work: None  If you have labs (blood work) drawn today and your tests are completely normal, you will receive your results only by: [place] (if you have MyChart) OR A paper copy in the mail If you have any lab test that is abnormal or we need to change your treatment, we will call you to review the results.   Testing/Procedures: None    Follow-Up: At [facility], you and your health needs are our priority.  As part of our continuing mission to provide you with exceptional heart care, we have created designated Provider Care Teams.  These Care Teams include your primary Cardiologist (physician) and Advanced Practice Providers (APPs -  Physician Assistants and Nurse Practitioners) who all work together to provide you with the care you need, when you need it.   Your next appointment:   6 month(s)  The format for your next appointment:   In Person  Provider:   You may see Ida Rogue, MD or one of the following Advanced Practice Providers on your designated Care Team:   Murray Hodgkins, NP Christell Faith, PA-C Marrianne Mood, PA-C Cadence [place], [place]

## 2020-11-21 ENCOUNTER — Other Ambulatory Visit: Payer: Self-pay | Admitting: Urology

## 2020-11-22 ENCOUNTER — Inpatient Hospital Stay: Payer: Medicare Other | Attending: Oncology

## 2020-11-22 ENCOUNTER — Other Ambulatory Visit: Payer: Self-pay

## 2020-11-22 DIAGNOSIS — D509 Iron deficiency anemia, unspecified: Secondary | ICD-10-CM | POA: Diagnosis not present

## 2020-11-22 LAB — CBC WITH DIFFERENTIAL/PLATELET
Abs Immature Granulocytes: 0.01 10*3/uL (ref 0.00–0.07)
Basophils Absolute: 0.1 10*3/uL (ref 0.0–0.1)
Basophils Relative: 1 %
Eosinophils Absolute: 0.3 10*3/uL (ref 0.0–0.5)
Eosinophils Relative: 5 %
HCT: 34 % — ABNORMAL LOW (ref 36.0–46.0)
Hemoglobin: 10.9 g/dL — ABNORMAL LOW (ref 12.0–15.0)
Immature Granulocytes: 0 %
Lymphocytes Relative: 24 %
Lymphs Abs: 1.6 10*3/uL (ref 0.7–4.0)
MCH: 25.6 pg — ABNORMAL LOW (ref 26.0–34.0)
MCHC: 32.1 g/dL (ref 30.0–36.0)
MCV: 79.8 fL — ABNORMAL LOW (ref 80.0–100.0)
Monocytes Absolute: 0.4 10*3/uL (ref 0.1–1.0)
Monocytes Relative: 7 %
Neutro Abs: 4.1 10*3/uL (ref 1.7–7.7)
Neutrophils Relative %: 63 %
Platelets: 184 10*3/uL (ref 150–400)
RBC: 4.26 MIL/uL (ref 3.87–5.11)
RDW: 13.6 % (ref 11.5–15.5)
WBC: 6.5 10*3/uL (ref 4.0–10.5)
nRBC: 0 % (ref 0.0–0.2)

## 2020-11-22 LAB — IRON AND TIBC
Iron: 27 ug/dL — ABNORMAL LOW (ref 28–170)
Saturation Ratios: 9 % — ABNORMAL LOW (ref 10.4–31.8)
TIBC: 315 ug/dL (ref 250–450)
UIBC: 288 ug/dL

## 2020-11-22 LAB — FERRITIN: Ferritin: 11 ng/mL (ref 11–307)
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
## 2020-11-23 ENCOUNTER — Ambulatory Visit
Admission: RE | Admit: 2020-11-23 | Discharge: 2020-11-23 | Disposition: A | Discharge status: Home / Self Care | Payer: Medicare Other | Source: Ambulatory Visit | Attending: Student | Admitting: Student

## 2020-11-23 DIAGNOSIS — S63501D Unspecified sprain of right wrist, subsequent encounter: Secondary | ICD-10-CM | POA: Insufficient documentation

## 2020-11-23 DIAGNOSIS — M19031 Primary osteoarthritis, right wrist: Secondary | ICD-10-CM | POA: Diagnosis present

## 2020-11-23 DIAGNOSIS — S63591A Other specified sprain of right wrist, initial encounter: Secondary | ICD-10-CM | POA: Diagnosis present

## 2020-11-26 ENCOUNTER — Other Ambulatory Visit: Payer: Self-pay

## 2020-11-26 ENCOUNTER — Other Ambulatory Visit: Payer: Self-pay | Admitting: Medical

## 2020-11-26 ENCOUNTER — Other Ambulatory Visit: Payer: Self-pay | Admitting: Family Medicine

## 2020-11-26 ENCOUNTER — Inpatient Hospital Stay: Payer: Medicare Other

## 2020-11-26 ENCOUNTER — Inpatient Hospital Stay (HOSPITAL_BASED_OUTPATIENT_CLINIC_OR_DEPARTMENT_OTHER): Payer: Medicare Other | Admitting: Oncology

## 2020-11-26 VITALS — BP 142/85 | HR 73 | Temp 99.1°F | Resp 16 | Wt 160.0 lb

## 2020-11-26 DIAGNOSIS — E039 Hypothyroidism, unspecified: Secondary | ICD-10-CM

## 2020-11-26 DIAGNOSIS — D509 Iron deficiency anemia, unspecified: Secondary | ICD-10-CM | POA: Diagnosis not present

## 2020-11-26 DIAGNOSIS — D508 Other iron deficiency anemias: Secondary | ICD-10-CM

## 2020-11-26 DIAGNOSIS — F419 Anxiety disorder, unspecified: Secondary | ICD-10-CM

## 2020-11-26 MED ORDER — IRON SUCROSE 20 MG/ML IV SOLN
200.0000 mg | Freq: Once | INTRAVENOUS | Status: AC
  Administered 2020-11-26: 200 mg via INTRAVENOUS
  Filled 2020-11-26: qty 10

## 2020-11-26 MED ORDER — SODIUM CHLORIDE 0.9 % IV SOLN
Freq: Once | INTRAVENOUS | Status: AC
  Filled 2020-11-26: qty 250

## 2020-11-26 MED ORDER — SODIUM CHLORIDE 0.9 % IV SOLN: 200.0000 mg | Freq: Once | INTRAVENOUS | Status: DC

## 2020-11-26 NOTE — Telephone Encounter (Signed)
Requested medications are due for refill today yes  Requested medications are on the active medication list yes  Last refill 10/29/20  Last visit 04/18/20  Future visit scheduled no  Notes to clinic UDS out of date, not delegated, please assess.  Requested Prescriptions  Pending Prescriptions Disp Refills   ALPRAZolam (XANAX) 1 MG tablet [Pharmacy Med Name: Alprazolam 1mg  Tablet] 60 tablet 5    Sig: Take 1 tablet by mouth twice daily     Not Delegated - Psychiatry:  Anxiolytics/Hypnotics Failed - 11/26/2020  2:04 AM      Failed - This refill cannot be delegated      Failed - Urine Drug Screen completed in last 360 days      Passed - Valid encounter within last 6 months    Recent Outpatient Visits           3 months ago Essential hypertension   [facility] [name], MD   5 months ago Somnolence   [facility] [name], MD   7 months ago [place], FNP   8 months ago Iron deficiency anemia due to chronic blood loss   [facility] [name], MD   9 months ago Anemia, unspecified type   [facility] [name], MD       Future Appointments             In 1 week [place], Nicki Reaper, MD [place]   In 1 month Caryn Section, Kirstie Peri, MD [facility], [facility]   In 2 months Deboraha Sprang, MD [facility]            Signed Prescriptions Disp Refills   levothyroxine (SYNTHROID) 25 MCG tablet 30 tablet 5    Sig: Take 1 tablet by mouth 30 minutes before breakfast     Endocrinology:  Hypothyroid Agents Failed - 11/26/2020  2:04 AM      Failed - TSH needs to be rechecked within 3 months after an abnormal result. Refill until TSH is due.      Passed - TSH in normal range and within 360 days    TSH  Date Value Ref Range Status  04/23/2020 1.600 0.350 - 4.500 uIU/mL Final    Comment:     Performed by a 3rd Generation assay with a functional sensitivity of <=0.01 uIU/mL. Performed at [facility], [address] [place], [place] [address]   04/18/2020 1.410 0.450 - 4.500 uIU/mL Final          Passed - Valid encounter within last 12 months    Recent Outpatient Visits           3 months ago Essential hypertension   [facility] [name], MD   5 months ago Somnolence   [facility] [name], MD   7 months ago [place], FNP   8 months ago Iron deficiency anemia due to chronic blood loss   [facility] [name], MD   9 months ago Anemia, unspecified type   [facility] [name], MD       Future Appointments             In 1 week Mac[place], Nicki Reaper, MD [place]   In 1 month [place], Kirstie Peri, MD [facility] [facility], [place]   In  2 months Deboraha Sprang, MD [facility] [place], [facility]

## 2020-11-26 NOTE — Progress Notes (Signed)
Pt c/o migraine x2-3 days with dizziness. No other concerns at this time

## 2020-11-26 NOTE — Telephone Encounter (Signed)
Requested Prescriptions  Pending Prescriptions Disp Refills  . levothyroxine (SYNTHROID) 25 MCG tablet [Pharmacy Med Name: Levothyroxine 1mcg Tab] 30 tablet 5    Sig: Take 1 tablet by mouth 30 minutes before breakfast     Endocrinology:  Hypothyroid Agents Failed - 11/26/2020  2:04 AM      Failed - TSH needs to be rechecked within 3 months after an abnormal result. Refill until TSH is due.      Passed - TSH in normal range and within 360 days    TSH  Date Value Ref Range Status  04/23/2020 1.600 0.350 - 4.500 uIU/mL Final    Comment:    Performed by a 3rd Generation assay with a functional sensitivity of <=0.01 uIU/mL. Performed at [facility], [address] [place], [place] [address]   04/18/2020 1.410 0.450 - 4.500 uIU/mL Final         Passed - Valid encounter within last 12 months    Recent Outpatient Visits          3 months ago Essential hypertension   [facility] [name], MD   5 months ago Somnolence   [facility] [name], MD   7 months ago [place], FNP   8 months ago Iron deficiency anemia due to chronic blood loss   [facility] [name], MD   9 months ago Anemia, unspecified type   [facility] [name], MD      Future Appointments            In 1 week MacDiarmid, Nicki Reaper, MD [place]   In 1 month Caryn Section, Kirstie Peri, MD [facility], [place]   In 2 months Deboraha Sprang, MD [facility]           . ALPRAZolam (XANAX) 1 MG tablet [Pharmacy Med Name: Alprazolam 1mg  Tablet] 60 tablet 5    Sig: Take 1 tablet by mouth twice daily     Not Delegated - Psychiatry:  Anxiolytics/Hypnotics Failed - 11/26/2020  2:04 AM      Failed - This refill cannot be delegated      Failed - Urine Drug Screen completed in last 360 days      Passed - Valid encounter  within last 6 months    Recent Outpatient Visits          3 months ago Essential hypertension   [facility] [name], MD   5 months ago Somnolence   [facility] [name], MD   7 months ago [place], FNP   8 months ago Iron deficiency anemia due to chronic blood loss   [facility] [name], MD   9 months ago Anemia, unspecified type   [facility] [name], MD      Future Appointments            In 1 week MacDiarmid, Nicki Reaper, MD [place]   In 1 month Caryn Section, Kirstie Peri, MD [facility], [place]   In 2 months Deboraha Sprang, MD [facility], [place]

## 2020-11-26 NOTE — Patient Instructions (Addendum)
[facility]  Discharge Instructions: Thank you for choosing [facility] to provide your oncology and hematology care.  If you have a lab appointment with the [facility], please go directly to the [facility] and check in at the registration area.  Wear comfortable clothing and clothing appropriate for easy access to any Portacath or PICC line.   We strive to give you quality time with your provider. You may need to reschedule your appointment if you arrive late (15 or more minutes).  Arriving late affects you and other patients whose appointments are after yours.  Also, if you miss three or more appointments without notifying the office, you may be dismissed from the clinic at the provider's discretion.      For prescription refill requests, have your pharmacy contact our office and allow 72 hours for refills to be completed.    Today you received the following : Venofer   To help prevent nausea and vomiting after your treatment, we encourage you to take your nausea medication as directed.  BELOW ARE SYMPTOMS THAT SHOULD BE REPORTED IMMEDIATELY: *FEVER GREATER THAN 100.4 F (38 C) OR HIGHER *CHILLS OR SWEATING *NAUSEA AND VOMITING THAT IS NOT CONTROLLED WITH YOUR NAUSEA MEDICATION *UNUSUAL SHORTNESS OF BREATH *UNUSUAL BRUISING OR BLEEDING *URINARY PROBLEMS (pain or burning when urinating, or frequent urination) *BOWEL PROBLEMS (unusual diarrhea, constipation, pain near the anus) TENDERNESS IN MOUTH AND THROAT WITH OR WITHOUT PRESENCE OF ULCERS (sore throat, sores in mouth, or a toothache) UNUSUAL RASH, SWELLING OR PAIN  UNUSUAL VAGINAL DISCHARGE OR ITCHING   Items with * indicate a potential emergency and should be followed up as soon as possible or go to the Emergency Department if any problems should occur.  Please show the CHEMOTHERAPY ALERT CARD or IMMUNOTHERAPY ALERT CARD at check-in to the Emergency Department and triage  nurse.  Should you have questions after your visit or need to cancel or reschedule your appointment, please contact [facility]  [phone] and follow the prompts.  Office hours are 8:00 a.m. to 4:30 p.m. Monday - Friday. Please note that voicemails left after 4:00 p.m. may not be returned until the following business day.  We are closed weekends and major holidays. You have access to a nurse at all times for urgent questions. Please call the main number to the clinic [phone] and follow the prompts.  For any non-urgent questions, you may also contact your provider using MyChart. We now offer e-Visits for anyone 67 and older to request care online for non-urgent symptoms. For details visit [email or web address].   Also download the MyChart app! Go to the app store, search "MyChart", open the app, select [facility], and log in with your MyChart username and password.  Due to Covid, a mask is required upon entering the hospital/clinic. If you do not have a mask, one will be given to you upon arrival. For doctor visits, patients may have 1 support person aged 25 or older with them. For treatment visits, patients cannot have anyone with them due to current Covid guidelines and our immunocompromised population. [facility]  Discharge Instructions: Thank you for choosing [facility] to provide your oncology and hematology care.  If you have a lab appointment with the [facility], please go directly to the [facility] and check in at the registration area.  Wear comfortable clothing and clothing appropriate for easy access to any Portacath or  PICC line.   We strive to give you quality time with your provider. You may need to reschedule your appointment if you arrive late (15 or more minutes).  Arriving late affects you and other patients whose appointments are after yours.  Also, if you miss three or more  appointments without notifying the office, you may be dismissed from the clinic at the provider's discretion.      For prescription refill requests, have your pharmacy contact our office and allow 72 hours for refills to be completed.    Today you received the following : venofer   To help prevent nausea and vomiting after your treatment, we encourage you to take your nausea medication as directed.  BELOW ARE SYMPTOMS THAT SHOULD BE REPORTED IMMEDIATELY:  *FEVER GREATER THAN 100.4 F (38 C) OR HIGHER  *CHILLS OR SWEATING  *NAUSEA AND VOMITING THAT IS NOT CONTROLLED WITH YOUR NAUSEA MEDICATION  *UNUSUAL SHORTNESS OF BREATH  *UNUSUAL BRUISING OR BLEEDING  *URINARY PROBLEMS (pain or burning when urinating, or frequent urination)  *BOWEL PROBLEMS (unusual diarrhea, constipation, pain near the anus)  TENDERNESS IN MOUTH AND THROAT WITH OR WITHOUT PRESENCE OF ULCERS (sore throat, sores in mouth, or a toothache)  UNUSUAL RASH, SWELLING OR PAIN   UNUSUAL VAGINAL DISCHARGE OR ITCHING   Items with * indicate a potential emergency and should be followed up as soon as possible or go to the Emergency Department if any problems should occur.  Please show the CHEMOTHERAPY ALERT CARD or IMMUNOTHERAPY ALERT CARD at check-in to the Emergency Department and triage nurse.  Should you have questions after your visit or need to cancel or reschedule your appointment, please contact [facility]  [phone] and follow the prompts.  Office hours are 8:00 a.m. to 4:30 p.m. Monday - Friday. Please note that voicemails left after 4:00 p.m. may not be returned until the following business day.  We are closed weekends and major holidays. You have access to a nurse at all times for urgent questions. Please call the main number to the clinic [phone] and follow the prompts.  For any non-urgent questions, you may also contact your provider using MyChart. We now offer e-Visits for  anyone 58 and older to request care online for non-urgent symptoms. For details visit [email or web address].   Also download the MyChart app! Go to the app store, search "MyChart", open the app, select [facility], and log in with your MyChart username and password.  Due to Covid, a mask is required upon entering the hospital/clinic. If you do not have a mask, one will be given to you upon arrival. For doctor visits, patients may have 1 support person aged 43 or older with them. For treatment visits, patients cannot have anyone with them due to current Covid guidelines and our immunocompromised population.

## 2020-12-03 ENCOUNTER — Other Ambulatory Visit: Payer: Self-pay

## 2020-12-03 ENCOUNTER — Inpatient Hospital Stay: Payer: Medicare Other | Attending: Oncology

## 2020-12-03 ENCOUNTER — Other Ambulatory Visit: Payer: Self-pay | Admitting: Gastroenterology

## 2020-12-03 VITALS — BP 169/83 | HR 64 | Temp 98.2°F | Resp 17

## 2020-12-03 DIAGNOSIS — I161 Hypertensive emergency: Secondary | ICD-10-CM | POA: Diagnosis present

## 2020-12-03 DIAGNOSIS — Z87440 Personal history of urinary (tract) infections: Secondary | ICD-10-CM

## 2020-12-03 DIAGNOSIS — R4182 Altered mental status, unspecified: Secondary | ICD-10-CM | POA: Diagnosis not present

## 2020-12-03 DIAGNOSIS — D508 Other iron deficiency anemias: Secondary | ICD-10-CM

## 2020-12-03 DIAGNOSIS — Z888 Allergy status to other drugs, medicaments and biological substances status: Secondary | ICD-10-CM

## 2020-12-03 DIAGNOSIS — Z955 Presence of coronary angioplasty implant and graft: Secondary | ICD-10-CM

## 2020-12-03 DIAGNOSIS — R45851 Suicidal ideations: Secondary | ICD-10-CM | POA: Diagnosis present

## 2020-12-03 DIAGNOSIS — F419 Anxiety disorder, unspecified: Secondary | ICD-10-CM | POA: Diagnosis present

## 2020-12-03 DIAGNOSIS — E039 Hypothyroidism, unspecified: Secondary | ICD-10-CM | POA: Diagnosis present

## 2020-12-03 DIAGNOSIS — I2510 Atherosclerotic heart disease of native coronary artery without angina pectoris: Secondary | ICD-10-CM | POA: Diagnosis present

## 2020-12-03 DIAGNOSIS — Z87891 Personal history of nicotine dependence: Secondary | ICD-10-CM

## 2020-12-03 DIAGNOSIS — Z79818 Long term (current) use of other agents affecting estrogen receptors and estrogen levels: Secondary | ICD-10-CM

## 2020-12-03 DIAGNOSIS — B9620 Unspecified Escherichia coli [E. coli] as the cause of diseases classified elsewhere: Secondary | ICD-10-CM | POA: Diagnosis present

## 2020-12-03 DIAGNOSIS — R29706 NIHSS score 6: Secondary | ICD-10-CM | POA: Diagnosis present

## 2020-12-03 DIAGNOSIS — Z79899 Other long term (current) drug therapy: Secondary | ICD-10-CM

## 2020-12-03 DIAGNOSIS — E1142 Type 2 diabetes mellitus with diabetic polyneuropathy: Secondary | ICD-10-CM | POA: Diagnosis present

## 2020-12-03 DIAGNOSIS — I252 Old myocardial infarction: Secondary | ICD-10-CM

## 2020-12-03 DIAGNOSIS — D509 Iron deficiency anemia, unspecified: Secondary | ICD-10-CM | POA: Insufficient documentation

## 2020-12-03 DIAGNOSIS — G9341 Metabolic encephalopathy: Secondary | ICD-10-CM | POA: Diagnosis present

## 2020-12-03 DIAGNOSIS — Z818 Family history of other mental and behavioral disorders: Secondary | ICD-10-CM

## 2020-12-03 DIAGNOSIS — R4702 Dysphasia: Secondary | ICD-10-CM | POA: Diagnosis present

## 2020-12-03 DIAGNOSIS — E785 Hyperlipidemia, unspecified: Secondary | ICD-10-CM | POA: Diagnosis present

## 2020-12-03 DIAGNOSIS — Z7902 Long term (current) use of antithrombotics/antiplatelets: Secondary | ICD-10-CM

## 2020-12-03 DIAGNOSIS — I69351 Hemiplegia and hemiparesis following cerebral infarction affecting right dominant side: Secondary | ICD-10-CM

## 2020-12-03 DIAGNOSIS — Z8249 Family history of ischemic heart disease and other diseases of the circulatory system: Secondary | ICD-10-CM

## 2020-12-03 DIAGNOSIS — N390 Urinary tract infection, site not specified: Secondary | ICD-10-CM | POA: Diagnosis present

## 2020-12-03 DIAGNOSIS — Z20822 Contact with and (suspected) exposure to covid-19: Secondary | ICD-10-CM | POA: Diagnosis present

## 2020-12-03 DIAGNOSIS — I10 Essential (primary) hypertension: Secondary | ICD-10-CM | POA: Diagnosis present

## 2020-12-03 DIAGNOSIS — R4781 Slurred speech: Secondary | ICD-10-CM | POA: Diagnosis present

## 2020-12-03 DIAGNOSIS — I6389 Other cerebral infarction: Secondary | ICD-10-CM | POA: Diagnosis not present

## 2020-12-03 DIAGNOSIS — Z85820 Personal history of malignant melanoma of skin: Secondary | ICD-10-CM

## 2020-12-03 DIAGNOSIS — F331 Major depressive disorder, recurrent, moderate: Secondary | ICD-10-CM | POA: Diagnosis present

## 2020-12-03 DIAGNOSIS — Z79890 Hormone replacement therapy (postmenopausal): Secondary | ICD-10-CM

## 2020-12-03 DIAGNOSIS — G4700 Insomnia, unspecified: Secondary | ICD-10-CM | POA: Diagnosis present

## 2020-12-03 MED ORDER — IRON SUCROSE 20 MG/ML IV SOLN
200.0000 mg | Freq: Once | INTRAVENOUS | Status: AC
  Administered 2020-12-03: 200 mg via INTRAVENOUS
  Filled 2020-12-03: qty 10

## 2020-12-03 MED ORDER — SODIUM CHLORIDE 0.9 % IV SOLN
Freq: Once | INTRAVENOUS | Status: AC
  Filled 2020-12-03: qty 250

## 2020-12-03 MED ORDER — SODIUM CHLORIDE 0.9 % IV SOLN: 200.0000 mg | Freq: Once | INTRAVENOUS | Status: DC

## 2020-12-03 NOTE — Patient Instructions (Signed)

## 2020-12-05 ENCOUNTER — Observation Stay: Payer: Medicare Other

## 2020-12-05 ENCOUNTER — Other Ambulatory Visit: Payer: Self-pay

## 2020-12-05 ENCOUNTER — Emergency Department: Payer: Medicare Other

## 2020-12-05 ENCOUNTER — Inpatient Hospital Stay
Admission: EM | Admit: 2020-12-05 | Discharge: 2020-12-10 | DRG: 064 | Disposition: A | Discharge status: Home Health | Payer: Medicare Other | Attending: Family Medicine | Admitting: Family Medicine

## 2020-12-05 DIAGNOSIS — Z79890 Hormone replacement therapy (postmenopausal): Secondary | ICD-10-CM

## 2020-12-05 DIAGNOSIS — I161 Hypertensive emergency: Secondary | ICD-10-CM | POA: Diagnosis present

## 2020-12-05 DIAGNOSIS — F331 Major depressive disorder, recurrent, moderate: Secondary | ICD-10-CM | POA: Diagnosis present

## 2020-12-05 DIAGNOSIS — R4182 Altered mental status, unspecified: Secondary | ICD-10-CM | POA: Diagnosis present

## 2020-12-05 DIAGNOSIS — Z79899 Other long term (current) drug therapy: Secondary | ICD-10-CM

## 2020-12-05 DIAGNOSIS — I639 Cerebral infarction, unspecified: Secondary | ICD-10-CM

## 2020-12-05 DIAGNOSIS — Z85820 Personal history of malignant melanoma of skin: Secondary | ICD-10-CM

## 2020-12-05 DIAGNOSIS — Z87891 Personal history of nicotine dependence: Secondary | ICD-10-CM

## 2020-12-05 DIAGNOSIS — Z7902 Long term (current) use of antithrombotics/antiplatelets: Secondary | ICD-10-CM

## 2020-12-05 DIAGNOSIS — Z79818 Long term (current) use of other agents affecting estrogen receptors and estrogen levels: Secondary | ICD-10-CM

## 2020-12-05 DIAGNOSIS — B9620 Unspecified Escherichia coli [E. coli] as the cause of diseases classified elsewhere: Secondary | ICD-10-CM | POA: Diagnosis present

## 2020-12-05 DIAGNOSIS — Z955 Presence of coronary angioplasty implant and graft: Secondary | ICD-10-CM

## 2020-12-05 DIAGNOSIS — R4702 Dysphasia: Secondary | ICD-10-CM | POA: Diagnosis present

## 2020-12-05 DIAGNOSIS — G9341 Metabolic encephalopathy: Secondary | ICD-10-CM | POA: Diagnosis present

## 2020-12-05 DIAGNOSIS — N390 Urinary tract infection, site not specified: Secondary | ICD-10-CM

## 2020-12-05 DIAGNOSIS — R52 Pain, unspecified: Secondary | ICD-10-CM

## 2020-12-05 DIAGNOSIS — Z8249 Family history of ischemic heart disease and other diseases of the circulatory system: Secondary | ICD-10-CM

## 2020-12-05 DIAGNOSIS — I10 Essential (primary) hypertension: Secondary | ICD-10-CM | POA: Diagnosis present

## 2020-12-05 DIAGNOSIS — R4781 Slurred speech: Secondary | ICD-10-CM | POA: Diagnosis present

## 2020-12-05 DIAGNOSIS — E1142 Type 2 diabetes mellitus with diabetic polyneuropathy: Secondary | ICD-10-CM | POA: Diagnosis present

## 2020-12-05 DIAGNOSIS — Z888 Allergy status to other drugs, medicaments and biological substances status: Secondary | ICD-10-CM

## 2020-12-05 DIAGNOSIS — I160 Hypertensive urgency: Secondary | ICD-10-CM

## 2020-12-05 DIAGNOSIS — R45851 Suicidal ideations: Secondary | ICD-10-CM | POA: Diagnosis present

## 2020-12-05 DIAGNOSIS — R29706 NIHSS score 6: Secondary | ICD-10-CM | POA: Diagnosis present

## 2020-12-05 DIAGNOSIS — F419 Anxiety disorder, unspecified: Secondary | ICD-10-CM | POA: Diagnosis present

## 2020-12-05 DIAGNOSIS — G4700 Insomnia, unspecified: Secondary | ICD-10-CM | POA: Diagnosis present

## 2020-12-05 DIAGNOSIS — Z818 Family history of other mental and behavioral disorders: Secondary | ICD-10-CM

## 2020-12-05 DIAGNOSIS — I252 Old myocardial infarction: Secondary | ICD-10-CM

## 2020-12-05 DIAGNOSIS — I6389 Other cerebral infarction: Principal | ICD-10-CM | POA: Diagnosis present

## 2020-12-05 DIAGNOSIS — Z87440 Personal history of urinary (tract) infections: Secondary | ICD-10-CM

## 2020-12-05 DIAGNOSIS — E785 Hyperlipidemia, unspecified: Secondary | ICD-10-CM | POA: Diagnosis present

## 2020-12-05 DIAGNOSIS — Z20822 Contact with and (suspected) exposure to covid-19: Secondary | ICD-10-CM | POA: Diagnosis present

## 2020-12-05 DIAGNOSIS — I69351 Hemiplegia and hemiparesis following cerebral infarction affecting right dominant side: Secondary | ICD-10-CM

## 2020-12-05 DIAGNOSIS — E039 Hypothyroidism, unspecified: Secondary | ICD-10-CM | POA: Diagnosis present

## 2020-12-05 DIAGNOSIS — I2510 Atherosclerotic heart disease of native coronary artery without angina pectoris: Secondary | ICD-10-CM | POA: Diagnosis present

## 2020-12-05 LAB — COMPREHENSIVE METABOLIC PANEL
ALT: 13 U/L (ref 0–44)
AST: 19 U/L (ref 15–41)
Albumin: 3.9 g/dL (ref 3.5–5.0)
Alkaline Phosphatase: 138 U/L — ABNORMAL HIGH (ref 38–126)
Anion gap: 10 (ref 5–15)
BUN: 25 mg/dL — ABNORMAL HIGH (ref 6–20)
CO2: 24 mmol/L (ref 22–32)
Calcium: 9.4 mg/dL (ref 8.9–10.3)
Chloride: 106 mmol/L (ref 98–111)
Creatinine, Ser: 1.68 mg/dL — ABNORMAL HIGH (ref 0.44–1.00)
GFR, Estimated: 36 mL/min — ABNORMAL LOW (ref >60)
Glucose, Bld: 146 mg/dL — ABNORMAL HIGH (ref 70–99)
Potassium: 4.5 mmol/L (ref 3.5–5.1)
Sodium: 140 mmol/L (ref 135–145)
Total Bilirubin: 0.9 mg/dL (ref 0.3–1.2)
Total Protein: 7.6 g/dL (ref 6.5–8.1)

## 2020-12-05 LAB — CBC
HCT: 41.9 % (ref 36.0–46.0)
Hemoglobin: 13.4 g/dL (ref 12.0–15.0)
MCH: 25.9 pg — ABNORMAL LOW (ref 26.0–34.0)
MCHC: 32 g/dL (ref 30.0–36.0)
MCV: 81 fL (ref 80.0–100.0)
Platelets: 186 10*3/uL (ref 150–400)
RBC: 5.17 MIL/uL — ABNORMAL HIGH (ref 3.87–5.11)
RDW: 13.9 % (ref 11.5–15.5)
WBC: 10.4 10*3/uL (ref 4.0–10.5)
nRBC: 0 % (ref 0.0–0.2)

## 2020-12-05 LAB — URINALYSIS, ROUTINE W REFLEX MICROSCOPIC
Bilirubin Urine: NEGATIVE
Glucose, UA: NEGATIVE mg/dL
Ketones, ur: NEGATIVE mg/dL
Nitrite: NEGATIVE
Protein, ur: 100 mg/dL — AB
RBC / HPF: >50 RBC/hpf — ABNORMAL HIGH (ref 0–5)
Specific Gravity, Urine: 1.016 (ref 1.005–1.030)
WBC, UA: >50 WBC/hpf — ABNORMAL HIGH (ref 0–5)
pH: 5 (ref 5.0–8.0)

## 2020-12-05 LAB — DIFFERENTIAL
Abs Immature Granulocytes: 0.03 10*3/uL (ref 0.00–0.07)
Basophils Absolute: 0.1 10*3/uL (ref 0.0–0.1)
Basophils Relative: 1 %
Eosinophils Absolute: 0 10*3/uL (ref 0.0–0.5)
Eosinophils Relative: 0 %
Immature Granulocytes: 0 %
Lymphocytes Relative: 10 %
Lymphs Abs: 1 10*3/uL (ref 0.7–4.0)
Monocytes Absolute: 0.4 10*3/uL (ref 0.1–1.0)
Monocytes Relative: 4 %
Neutro Abs: 8.9 10*3/uL — ABNORMAL HIGH (ref 1.7–7.7)
Neutrophils Relative %: 85 %

## 2020-12-05 LAB — RESP PANEL BY RT-PCR (FLU A&B, COVID) ARPGX2
Influenza A by PCR: NEGATIVE
Influenza B by PCR: NEGATIVE
SARS Coronavirus 2 by RT PCR: NEGATIVE

## 2020-12-05 LAB — PROTIME-INR
INR: 1.1 (ref 0.8–1.2)
Prothrombin Time: 13.8 seconds (ref 11.4–15.2)

## 2020-12-05 LAB — APTT: aPTT: 40 seconds — ABNORMAL HIGH (ref 24–36)

## 2020-12-05 LAB — POC URINE PREG, ED: Preg Test, Ur: NEGATIVE

## 2020-12-05 LAB — LIPASE, BLOOD: Lipase: 28 U/L (ref 11–51)

## 2020-12-05 LAB — TROPONIN I (HIGH SENSITIVITY): Troponin I (High Sensitivity): 10 ng/L (ref <18)

## 2020-12-05 MED ORDER — POLYVINYL ALCOHOL 1.4 % OP SOLN
Freq: Every day | OPHTHALMIC | Status: DC | PRN
  Filled 2020-12-05: qty 15

## 2020-12-05 MED ORDER — SODIUM CHLORIDE 0.9 % IV SOLN
2.0000 g | Freq: Once | INTRAVENOUS | Status: AC
  Administered 2020-12-05: 2 g via INTRAVENOUS
  Filled 2020-12-05: qty 20

## 2020-12-05 MED ORDER — ACETAMINOPHEN 650 MG RE SUPP: 650.0000 mg | Freq: Four times a day (QID) | RECTAL | Status: DC | PRN

## 2020-12-05 MED ORDER — IOHEXOL 350 MG/ML SOLN
60.0000 mL | Freq: Once | INTRAVENOUS | Status: AC | PRN
  Administered 2020-12-05: 60 mL via INTRAVENOUS

## 2020-12-05 MED ORDER — GABAPENTIN 300 MG PO CAPS
600.0000 mg | ORAL_CAPSULE | Freq: Two times a day (BID) | ORAL | Status: DC
  Administered 2020-12-06 – 2020-12-10 (×10): 600 mg via ORAL
  Filled 2020-12-05 (×11): qty 2

## 2020-12-05 MED ORDER — CARVEDILOL 6.25 MG PO TABS
12.5000 mg | ORAL_TABLET | Freq: Two times a day (BID) | ORAL | Status: DC
  Administered 2020-12-06 – 2020-12-07 (×5): 12.5 mg via ORAL
  Filled 2020-12-05: qty 4
  Filled 2020-12-05 (×5): qty 2

## 2020-12-05 MED ORDER — ESTRADIOL 0.1 MG/GM VA CREA
1.0000 | TOPICAL_CREAM | VAGINAL | Status: DC
  Administered 2020-12-09: 11:00:00 1 via VAGINAL
  Filled 2020-12-05: qty 42.5

## 2020-12-05 MED ORDER — LORAZEPAM 2 MG/ML IJ SOLN
1.0000 mg | Freq: Once | INTRAMUSCULAR | Status: AC
  Administered 2020-12-05: 1 mg via INTRAVENOUS
  Filled 2020-12-05: qty 1

## 2020-12-05 MED ORDER — DIPHENOXYLATE-ATROPINE 2.5-0.025 MG PO TABS: 1.0000 | ORAL_TABLET | Freq: Four times a day (QID) | ORAL | Status: DC | PRN

## 2020-12-05 MED ORDER — SODIUM CHLORIDE 0.9 % IV SOLN: INTRAVENOUS | Status: DC

## 2020-12-05 MED ORDER — NITROGLYCERIN 0.4 MG SL SUBL: 0.4000 mg | SUBLINGUAL_TABLET | SUBLINGUAL | Status: DC | PRN

## 2020-12-05 MED ORDER — VENLAFAXINE HCL ER 37.5 MG PO CP24: 37.5000 mg | ORAL_CAPSULE | Freq: Every day | ORAL | Status: DC

## 2020-12-05 MED ORDER — ACETAMINOPHEN 325 MG PO TABS: 650.0000 mg | ORAL_TABLET | Freq: Four times a day (QID) | ORAL | Status: DC | PRN

## 2020-12-05 MED ORDER — DICLOFENAC SODIUM 1 % EX GEL
2.0000 g | Freq: Four times a day (QID) | CUTANEOUS | Status: DC
  Filled 2020-12-05: qty 100

## 2020-12-05 MED ORDER — ENOXAPARIN SODIUM 40 MG/0.4ML IJ SOSY
40.0000 mg | PREFILLED_SYRINGE | INTRAMUSCULAR | Status: DC
  Administered 2020-12-06 – 2020-12-10 (×5): 40 mg via SUBCUTANEOUS
  Filled 2020-12-05 (×5): qty 0.4

## 2020-12-05 MED ORDER — SODIUM CHLORIDE 0.9% FLUSH
3.0000 mL | Freq: Once | INTRAVENOUS | Status: AC
Start: 2020-12-05 — End: 2020-12-06
  Administered 2020-12-06: 3 mL via INTRAVENOUS

## 2020-12-05 MED ORDER — BACLOFEN 10 MG PO TABS
5.0000 mg | ORAL_TABLET | Freq: Two times a day (BID) | ORAL | Status: DC | PRN
  Administered 2020-12-06 – 2020-12-10 (×6): 5 mg via ORAL
  Filled 2020-12-05: qty 1
  Filled 2020-12-05: qty 0.5
  Filled 2020-12-05 (×4): qty 1
  Filled 2020-12-05: qty 0.5

## 2020-12-05 MED ORDER — GLUCOSE-VITAMIN C 4-6 GM-MG PO CHEW
1.0000 | CHEWABLE_TABLET | Freq: Three times a day (TID) | ORAL | Status: DC
  Filled 2020-12-05: qty 1

## 2020-12-05 MED ORDER — FUROSEMIDE 20 MG PO TABS
20.0000 mg | ORAL_TABLET | ORAL | Status: DC | PRN
  Administered 2020-12-08: 09:00:00 20 mg via ORAL
  Filled 2020-12-05: qty 1

## 2020-12-05 MED ORDER — CLOPIDOGREL BISULFATE 75 MG PO TABS
75.0000 mg | ORAL_TABLET | Freq: Every day | ORAL | Status: DC
  Administered 2020-12-06 – 2020-12-10 (×5): 75 mg via ORAL
  Filled 2020-12-05 (×6): qty 1

## 2020-12-05 MED ORDER — TRAZODONE HCL 50 MG PO TABS
25.0000 mg | ORAL_TABLET | Freq: Every evening | ORAL | Status: DC | PRN
  Filled 2020-12-05: qty 1

## 2020-12-05 MED ORDER — ONDANSETRON HCL 4 MG PO TABS: 4.0000 mg | ORAL_TABLET | Freq: Four times a day (QID) | ORAL | Status: DC | PRN

## 2020-12-05 MED ORDER — GLUCAGON HCL RDNA (DIAGNOSTIC) 1 MG IJ SOLR: 1.0000 mL | INTRAMUSCULAR | Status: DC | PRN

## 2020-12-05 MED ORDER — ISOSORBIDE MONONITRATE ER 30 MG PO TB24
120.0000 mg | ORAL_TABLET | Freq: Every day | ORAL | Status: DC
  Administered 2020-12-06 – 2020-12-10 (×5): 120 mg via ORAL
  Filled 2020-12-05: qty 4
  Filled 2020-12-05: qty 2
  Filled 2020-12-05 (×3): qty 4

## 2020-12-05 MED ORDER — DESVENLAFAXINE SUCCINATE ER 50 MG PO TB24
50.0000 mg | ORAL_TABLET | Freq: Every morning | ORAL | Status: DC
  Administered 2020-12-06 – 2020-12-10 (×5): 50 mg via ORAL
  Filled 2020-12-05 (×5): qty 1

## 2020-12-05 MED ORDER — MEMANTINE HCL 5 MG PO TABS
5.0000 mg | ORAL_TABLET | Freq: Two times a day (BID) | ORAL | Status: DC
  Administered 2020-12-06 – 2020-12-09 (×8): 5 mg via ORAL
  Filled 2020-12-05 (×8): qty 1

## 2020-12-05 MED ORDER — BUPROPION HCL ER (SR) 100 MG PO TB12
100.0000 mg | ORAL_TABLET | Freq: Two times a day (BID) | ORAL | Status: DC
  Administered 2020-12-06 – 2020-12-10 (×10): 100 mg via ORAL
  Filled 2020-12-05 (×11): qty 1

## 2020-12-05 MED ORDER — PROMETHAZINE HCL 25 MG PO TABS
25.0000 mg | ORAL_TABLET | Freq: Four times a day (QID) | ORAL | Status: DC | PRN
  Administered 2020-12-06 – 2020-12-09 (×4): 25 mg via ORAL
  Filled 2020-12-05 (×6): qty 1

## 2020-12-05 MED ORDER — ALPRAZOLAM 1 MG PO TABS
1.0000 mg | ORAL_TABLET | Freq: Two times a day (BID) | ORAL | Status: DC
  Administered 2020-12-06 – 2020-12-10 (×9): 1 mg via ORAL
  Filled 2020-12-05: qty 1
  Filled 2020-12-05: qty 2
  Filled 2020-12-05 (×2): qty 1
  Filled 2020-12-05: qty 2
  Filled 2020-12-05 (×4): qty 1

## 2020-12-05 MED ORDER — MAGNESIUM HYDROXIDE 400 MG/5ML PO SUSP
30.0000 mL | Freq: Every day | ORAL | Status: DC | PRN
  Filled 2020-12-05: qty 30

## 2020-12-05 MED ORDER — AMLODIPINE BESYLATE 5 MG PO TABS
2.5000 mg | ORAL_TABLET | Freq: Every day | ORAL | Status: DC
  Administered 2020-12-06 – 2020-12-08 (×3): 2.5 mg via ORAL
  Filled 2020-12-05 (×3): qty 1

## 2020-12-05 MED ORDER — LORAZEPAM 2 MG/ML IJ SOLN: 0.5000 mg | Freq: Once | INTRAMUSCULAR | Status: DC

## 2020-12-05 MED ORDER — LEVOTHYROXINE SODIUM 25 MCG PO TABS
25.0000 ug | ORAL_TABLET | Freq: Every day | ORAL | Status: DC
  Administered 2020-12-06 – 2020-12-10 (×5): 25 ug via ORAL
  Filled 2020-12-05 (×5): qty 1

## 2020-12-05 MED ORDER — TRAZODONE HCL 100 MG PO TABS
100.0000 mg | ORAL_TABLET | Freq: Every day | ORAL | Status: DC
Start: 2020-12-05 — End: 2020-12-10
  Administered 2020-12-06 – 2020-12-09 (×5): 100 mg via ORAL
  Filled 2020-12-05: qty 2
  Filled 2020-12-05: qty 1
  Filled 2020-12-05: qty 2
  Filled 2020-12-05: qty 1
  Filled 2020-12-05 (×2): qty 2

## 2020-12-05 MED ORDER — ROSUVASTATIN CALCIUM 20 MG PO TABS
40.0000 mg | ORAL_TABLET | Freq: Every day | ORAL | Status: DC
  Administered 2020-12-06 – 2020-12-10 (×5): 40 mg via ORAL
  Filled 2020-12-05 (×5): qty 2

## 2020-12-05 MED ORDER — ONDANSETRON HCL 4 MG/2ML IJ SOLN
4.0000 mg | Freq: Four times a day (QID) | INTRAMUSCULAR | Status: DC | PRN
  Filled 2020-12-05: qty 2

## 2020-12-05 NOTE — ED Provider Notes (Signed)
[facility] Emergency Department Provider Note  ____________________________________________   Event Date/Time   First MD Initiated Contact with Patient 12/05/20 1945     (approximate)  I have reviewed the triage vital signs and the nursing notes.   HISTORY  Chief Complaint Altered Mental Status    HPI [name] [name] is a 50 y.o. female with history of CVA with right-sided deficits and some aphasia, diabetes, MRI who comes in with concerns for confusion.  Patient's last known well was last night.  Son come home today at 5 PM patient was very confused unable to answer abnormal questions.  Per son he last saw her around 41 PM last night.  He states that this is happened previously when she had a UTI.  However he states that this confusion seems to be worse than previous.  Denies being on any medications to help prevent UTIs at this time.  Patient is reporting some abdominal pain as well.  Unclear when this started.  Patient does report having a fall today but unclear the circumstances of it  Unable to get full HPI due to altered mental status            Past Medical History:  Diagnosis Date   Acute colitis 01/27/2017   Acute pyelonephritis    Acute upper GI bleed 01/25/2020   Anemia    iron deficiency anemia   Aortic arch aneurysm    BRCA negative 2014   CAD (coronary artery disease)    a. 08/2003 Cath: LAD 30-40-med Rx; b. 11/2014 PCI: LAD 35m(3.25x23 Xience Alpine DES); c. 06/2015 PCI: D1 ((1.61W96Resolute Integrity DES); d. 06/2017 PCI: Patent mLAD stent, D2 95 (PTCA); e. 09/2017 PCI: D2 99ost (CBA); d. 12/2017 Cath: LM nl, LAD 322m80d (small), D1 40ost, D2 95ost, LCX 40p, RCA 40ost/p->Med rx for D2 given restenosis.   Colitis 06/03/2015   Colon polyp    CVA (cerebral vascular accident) (HCNyack   Left side weakness.    Degenerative tear of glenoid labrum of right shoulder 03/15/2017   Diabetes mellitus without complication (HCCornlea   Family  history of breast cancer    BRCA neg 2014   Femur fracture, left (HCPlandome Manor9/10/2018   Gastric ulcer 04/27/2011   History of echocardiogram    a. 03/2017 Echo: EF 60-65%, no rwma; b. 02/2018 Echo: EF 60-65%, no rwma. Nl RV fxn. No cardiac source of emboli (admitted w/ stroke).   Hypertension    Malignant melanoma of skin of scalp (HCC)    MI, acute, non ST segment elevation (HCC)    Neuromuscular disorder (HCC)    S/P drug eluting coronary stent placement 06/04/2015   Sepsis (HCKillian2/14/2019   Sepsis secondary to UTI (HCArgonia3/22/2022   Stroke (HCArcadia   a. 02/2018 MRI: 19m55mate acute/early subacute L medial frontal lobe inarct; b. 02/2018 MRA No large vessel occlusion or aneurysm. Mod to sev L P2 stenosis. thready L vertebral artery, diffusely dzs'd; c. 02/2018 Carotid U/S: <50% bilat ICA dzs.    Patient Active Problem List   Diagnosis Date Noted   CVA (cerebral vascular accident) (HCCBlaine7/07/2020   Unspecified atrial fibrillation (HCCRussell Springs3/22/2022   Family history of malignant neoplasm of gastrointestinal tract    Iron deficiency anemia secondary to blood loss (chronic)    Near syncope 01/25/2020   Diabetes mellitus (HCCLocustdale2/23/2021   Pulmonary embolism (HCCSoap Lake0/06/2019   Chest pain 11/05/2019   Type II diabetes mellitus with  renal manifestations ([place]) 11/05/2019   Acute renal failure superimposed on stage 3a chronic kidney disease ([place]) 09/02/2019   Hypoglycemia    Family history of breast cancer 05/23/2019   Family history of colon cancer 05/23/2019   Retinopathy 05/12/2019   Abnormal LFTs 04/18/2019   Esophageal dysphagia    Gastrointestinal tract imaging abnormality    Trochanteric bursitis, left hip 03/17/2019   Malignant neoplasm of vulva, unspecified ([place]) 03/10/2019   Peripheral vascular disease, unspecified ([place]) 03/10/2019   Paroxysmal supraventricular tachycardia ([place]) 03/10/2019   Recurrent chest pain 01/19/2019   Depression, major, single episode, moderate ([place]) 12/09/2018    Closed nondisplaced intertrochanteric fracture of left femur ([place]) 10/12/2018   Hypotension 06/26/2018   Autonomic neuropathy 03/24/2018   Acute delirium 03/03/2018   H/O gastric bypass 03/02/2018   Hypertension associated with stage 3 chronic kidney disease due to type 2 diabetes mellitus ([place]) 02/20/2018   Insomnia 03/18/2017   Ischemic cardiomyopathy    Arthritis    Anxiety    Hx of colonic polyps    H/O medication noncompliance 12/14/2015   Coronary artery disease involving native coronary artery of native heart with angina pectoris ([place])    Hypertensive heart disease    Status post bariatric surgery 06/04/2015   Carotid stenosis 04/30/2015   Iron deficiency anemia 03/22/2015   Vitamin B12 deficiency 02/18/2015   Major depressive disorder, recurrent, severe with psychotic features ([place]) 62/04/5595   Helicobacter pylori infection 11/23/2014   Malignant melanoma ([place]) 08/25/2014   Incomplete bladder emptying 07/12/2014   Hypothyroidism 12/30/2013   Aberrant subclavian artery 11/17/2013   Multiple sclerosis ([place]) 11/02/2013   History of CVA with residual deficit 06/20/2013   Headache, migraine 05/29/2013   Hyperlipidemia    GERD (gastroesophageal reflux disease)    Neuropathy ([place]) 01/02/2011   Stroke ([place]) 06/21/2008   Depression with anxiety 05/01/2008   Essential hypertension 05/01/2008    Past Surgical History:  Procedure Laterality Date   APPENDECTOMY     BALLOON ENTEROSCOPY  02/06/2020   [facility]   BIOPSY N/A 03/14/2020   Procedure: BIOPSY;  Surgeon: [name], MD;  Location: [place];  Service: Endoscopy;  Laterality: N/A;   CARDIAC CATHETERIZATION N/A 11/09/2014   Procedure: Coronary Angiography;  Surgeon: Minna Merritts, MD;  Location: [place] [facility];  Service: Cardiovascular;  Laterality: N/A;   CARDIAC CATHETERIZATION N/A 11/12/2014   Procedure: Coronary Stent Intervention;  Surgeon: Isaias Cowman, MD;  Location: [place] [facility];   Service: Cardiovascular;  Laterality: N/A;   CARDIAC CATHETERIZATION N/A 04/18/2015   Procedure: Left Heart Cath and Coronary Angiography;  Surgeon: Minna Merritts, MD;  Location: [place] [facility];  Service: Cardiovascular;  Laterality: N/A;   CARDIAC CATHETERIZATION Left 06/04/2015   Procedure: Left Heart Cath and Coronary Angiography;  Surgeon: Wellington Hampshire, MD;  Location: [place] [facility];  Service: Cardiovascular;  Laterality: Left;   CARDIAC CATHETERIZATION N/A 06/04/2015   Procedure: Coronary Stent Intervention;  Surgeon: Wellington Hampshire, MD;  Location: [place] [facility];  Service: Cardiovascular;  Laterality: N/A;   CESAREAN SECTION  2001   CHOLECYSTECTOMY N/A 11/18/2016   Procedure: LAPAROSCOPIC CHOLECYSTECTOMY WITH INTRAOPERATIVE CHOLANGIOGRAM;  Surgeon: Christene Lye, MD;  Location: [facility];  Service: General;  Laterality: N/A;   COLONOSCOPY WITH PROPOFOL N/A 04/27/2016   Procedure: COLONOSCOPY WITH PROPOFOL;  Surgeon: [name], MD;  Location: [place];  Service: Endoscopy;  Laterality: N/A;   COLONOSCOPY WITH PROPOFOL N/A 01/12/2018  Procedure: COLONOSCOPY WITH PROPOFOL;  Surgeon: Toledo, Benay Pike, MD;  Location: [facility];  Service: Endoscopy;  Laterality: N/A;   COLONOSCOPY WITH PROPOFOL N/A 03/14/2020   Procedure: COLONOSCOPY WITH PROPOFOL;  Surgeon: [name], MD;  Location: [place];  Service: Endoscopy;  Laterality: N/A;  Needs to be scheduled after 7:30 due to driver issues   CORONARY ANGIOPLASTY     CORONARY BALLOON ANGIOPLASTY N/A 06/29/2017   Procedure: CORONARY BALLOON ANGIOPLASTY;  Surgeon: Wellington Hampshire, MD;  Location: [place] [facility];  Service: Cardiovascular;  Laterality: N/A;   CORONARY BALLOON ANGIOPLASTY N/A 09/20/2017   Procedure: CORONARY BALLOON ANGIOPLASTY;  Surgeon: Wellington Hampshire, MD;  Location: [place] [facility];  Service: Cardiovascular;  Laterality: N/A;   CORONARY STENT INTERVENTION N/A  12/13/2019   Procedure: CORONARY STENT INTERVENTION;  Surgeon: Wellington Hampshire, MD;  Location: [place] [facility];  Service: Cardiovascular;  Laterality: N/A;   DILATION AND CURETTAGE OF UTERUS     ESOPHAGOGASTRODUODENOSCOPY (EGD) WITH PROPOFOL N/A 09/14/2014   Procedure: ESOPHAGOGASTRODUODENOSCOPY (EGD) WITH PROPOFOL;  Surgeon: Josefine Class, MD;  [facility];  Service: Endoscopy;  Laterality: N/A;   ESOPHAGOGASTRODUODENOSCOPY (EGD) WITH PROPOFOL N/A 04/27/2016   Procedure: ESOPHAGOGASTRODUODENOSCOPY (EGD) WITH PROPOFOL;  Surgeon: [name], MD;  Location: [place];  Service: Endoscopy;  Laterality: N/A;  Diabetic - oral meds   ESOPHAGOGASTRODUODENOSCOPY (EGD) WITH PROPOFOL N/A 01/12/2018   Procedure: ESOPHAGOGASTRODUODENOSCOPY (EGD) WITH PROPOFOL;  Surgeon: Toledo, Benay Pike, MD;  Location: [facility];  Service: Endoscopy;  Laterality: N/A;   ESOPHAGOGASTRODUODENOSCOPY (EGD) WITH PROPOFOL N/A 04/11/2019   Procedure: ESOPHAGOGASTRODUODENOSCOPY (EGD) WITH PROPOFOL;  Surgeon: [name], MD;  Location: [facility];  Service: Endoscopy;  Laterality: N/A;   ESOPHAGOGASTRODUODENOSCOPY (EGD) WITH PROPOFOL N/A 03/08/2020   Procedure: ESOPHAGOGASTRODUODENOSCOPY (EGD) WITH PROPOFOL;  Surgeon: [name], MD;  Location: [facility]COPY;  Service: Endoscopy;  Laterality: N/A;   GASTRIC BYPASS  09/2009   [facility]    INTRAMEDULLARY (IM) NAIL INTERTROCHANTERIC Left 10/13/2018   Procedure: INTRAMEDULLARY (IM) NAIL INTERTROCHANTRIC;  Surgeon: Leandrew Koyanagi, MD;  Location: [place];  Service: Orthopedics;  Laterality: Left;   Left Carotid to sublcavian artery bypass w/ subclavian artery ligation     a. Performed @ [facility].   LEFT HEART CATH AND CORONARY ANGIOGRAPHY Left 06/29/2017   Procedure: LEFT HEART CATH AND CORONARY ANGIOGRAPHY;  Surgeon: Wellington Hampshire, MD;  Location: [place] [facility];  Service: Cardiovascular;  Laterality: Left;   LEFT HEART  CATH AND CORONARY ANGIOGRAPHY N/A 09/20/2017   Procedure: LEFT HEART CATH AND CORONARY ANGIOGRAPHY;  Surgeon: Wellington Hampshire, MD;  Location: [place] [facility];  Service: Cardiovascular;  Laterality: N/A;   LEFT HEART CATH AND CORONARY ANGIOGRAPHY N/A 12/20/2017   Procedure: LEFT HEART CATH AND CORONARY ANGIOGRAPHY;  Surgeon: Wellington Hampshire, MD;  Location: [place] [facility];  Service: Cardiovascular;  Laterality: N/A;   LEFT HEART CATH AND CORONARY ANGIOGRAPHY N/A 04/20/2019   Procedure: LEFT HEART CATH AND CORONARY ANGIOGRAPHY possible PCI;  Surgeon: Minna Merritts, MD;  Location: [place] [facility];  Service: Cardiovascular;  Laterality: N/A;   LEFT HEART CATH AND CORONARY ANGIOGRAPHY N/A 12/13/2019   Procedure: LEFT HEART CATH AND CORONARY ANGIOGRAPHY;  Surgeon: Wellington Hampshire, MD;  Location: [place] [facility];  Service: Cardiovascular;  Laterality: N/A;   MELANOMA EXCISION  2016   Dr. Henreitta Cea ABLATION  2002   RIGHT OOPHORECTOMY  SHOULDER ARTHROSCOPY WITH OPEN ROTATOR CUFF REPAIR Right 01/07/2016   Procedure: SHOULDER ARTHROSCOPY WITH DEBRIDMENT, SUBACHROMIAL DECOMPRESSION;  Surgeon: Corky Mull, MD;  Location: [facility];  Service: Orthopedics;  Laterality: Right;   SHOULDER ARTHROSCOPY WITH OPEN ROTATOR CUFF REPAIR Right 03/16/2017   Procedure: SHOULDER ARTHROSCOPY WITH OPEN ROTATOR CUFF REPAIR POSSIBLE BICEPS TENODESIS;  Surgeon: Corky Mull, MD;  Location: [facility];  Service: Orthopedics;  Laterality: Right;   TRIGGER FINGER RELEASE Right     [place]    Prior to Admission medications   Medication Sig Start Date End Date Taking? Authorizing Provider  AIMOVIG 140 MG/ML SOAJ Inject into the skin. 10/14/20   [provider]  ALPRAZolam Duanne Moron) 1 MG tablet Take 1 tablet by mouth twice daily 11/26/20   [name], MD  amLODipine (NORVASC) 2.5 MG tablet Take 2.5 mg by mouth daily. 07/11/20   [provider]  BAQSIMI ONE PACK 3 MG/DOSE  POWD Place 1 spray into both nostrils as directed. 02/11/20   [provider]  buPROPion (WELLBUTRIN SR) 100 MG 12 hr tablet Take 100 mg by mouth 2 (two) times daily. 05/03/20   [provider]  carvedilol (COREG) 12.5 MG tablet Take 1 tablet by mouth twice daily 08/21/20   Minna Merritts, MD  clopidogrel (PLAVIX) 75 MG tablet Take 1 tablet (75 mg total) by mouth daily. 11/20/20   Furth, Cadence H, PA-C  Desvenlafaxine ER (PRISTIQ) 50 MG TB24 Take 1 tablet (50 mg total) by mouth every morning. 07/24/20   [name], MD  diclofenac Sodium (VOLTAREN) 1 % GEL Apply 2 g topically 4 (four) times daily.    [provider]  diphenoxylate-atropine (LOMOTIL) 2.5-0.025 MG tablet Take 1 tablet by mouth 4 (four) times daily as needed for diarrhea or loose stools. 07/03/20   [name], MD  estradiol (ESTRACE) 0.1 MG/GM vaginal cream Estrogen Cream Instruction Discard applicator Apply pea sized amount to tip of finger to urethra before bed. Wash hands well after application. Use Monday, Wednesday and Friday 05/08/20   Billey Co, MD  furosemide (LASIX) 20 MG tablet Take 1 tablet (20 mg total) by mouth as needed (As needed for swelling). 11/20/20 02/18/21  Furth, Cadence H, PA-C  gabapentin (NEURONTIN) 300 MG capsule Take 2 capsules (600 mg total) by mouth 2 (two) times daily. 07/24/20   [name], MD  glucose 4 GM chewable tablet Chew 1 tablet (4 g total) by mouth 3 (three) times daily. 04/25/20   Fritzi Mandes, MD  isosorbide mononitrate (IMDUR) 120 MG 24 hr tablet Take 1 tablet by mouth every day 10/28/20   Minna Merritts, MD  levothyroxine (SYNTHROID) 25 MCG tablet Take 1 tablet by mouth 30 minutes before breakfast 11/26/20   [name], MD  memantine (NAMENDA) 5 MG tablet Take 5 mg by mouth 2 (two) times daily. 05/03/20   [provider]  nitroGLYCERIN (NITROSTAT) 0.4 MG SL tablet Place 1 tablet (0.4 mg total) under the tongue every 5 (five) minutes as needed  for chest pain. Do not exceed 3 dose within 30 mins 10/04/20   Minna Merritts, MD  Polyethyl Glycol-Propyl Glycol (SYSTANE OP) Place 1 drop into both eyes daily as needed (dry eyes).    [provider]  promethazine (PHENERGAN) 25 MG tablet Take 1 tablet (25 mg total) by mouth every 6 (six) hours as needed for nausea or vomiting. 07/02/20   [name], MD  rosuvastatin (CRESTOR) 40  MG tablet Take 1 tablet (40 mg total) by mouth daily at 6 PM. 10/25/19   Gollan, Kathlene November, MD  traMADol (ULTRAM) 50 MG tablet Take 50 mg by mouth every 6 (six) hours as needed. Patient not taking: No sig reported 06/24/20   [provider]  traZODone (DESYREL) 100 MG tablet Take 1 to 2 tablets by mouth at bedtime 08/21/20   [name], MD  Ubrogepant (UBRELVY) 100 MG TABS Take 100 mg by mouth as needed (migraine). Max of 2 tablets daily Patient not taking: No sig reported 05/03/20   [provider]    Allergies Lipitor [atorvastatin]  Family History  Problem Relation Age of Onset   Hypertension Mother    Anxiety disorder Mother    Depression Mother    Bipolar disorder Mother    Heart disease Mother        No details   Hyperlipidemia Mother    Kidney disease Father    Heart disease Father 28   Hypertension Father    Diabetes Father    Stroke Father    Colon cancer Father        dx in his 53's   Anxiety disorder Father    Depression Father    Skin cancer Father    Kidney disease Sister    Thyroid nodules Sister    Hypertension Sister    Hypertension Sister    Diabetes Sister    Hyperlipidemia Sister    Depression Sister    Breast cancer Maternal Aunt 61   Breast cancer Maternal Aunt 56   Ovarian cancer Cousin    Colon cancer Cousin    Kidney cancer Cousin    Breast cancer Other    Bladder Cancer Neg Hx     Social History Social History   Tobacco Use   Smoking status: Former    Packs/day: 0.25    Years: 1.00    Pack years: 0.25    Types: Cigarettes     Quit date: 08/31/1994    Years since quitting: 26.2   Smokeless tobacco: Never   Tobacco comments:    quit 28 years ago  Vaping Use   Vaping Use: Never used  Substance Use Topics   Alcohol use: No    Alcohol/week: 0.0 standard drinks   Drug use: No      Review of Systems Review of systems is limited due to altered mental status ____________________________________________   PHYSICAL EXAM:  VITAL SIGNS: ED Triage Vitals  Enc Vitals Group     BP 12/05/20 1844 (!) 207/102     Pulse Rate 12/05/20 1836 73     Resp 12/05/20 1836 16     Temp 12/05/20 1836 98.4 F (36.9 C)     Temp Source 12/05/20 1836 Oral     SpO2 12/05/20 1836 98 %     Weight 12/05/20 1837 159 lb 13.3 oz (72.5 kg)     Height 12/05/20 1837 '5\' 2"'  (1.575 m)     Head Circumference --      Peak Flow --      Pain Score 12/05/20 1837 10     Pain Loc --      Pain Edu? --      Excl. in [place]? --     Constitutional: Patient seems slightly confused Eyes: Conjunctivae are normal. EOMI. Head: Atraumatic. Nose: No congestion/rhinnorhea. Mouth/Throat: Mucous membranes are moist.   Neck: No stridor. Trachea Midline. FROM Cardiovascular: Normal rate, regular rhythm. Grossly normal heart sounds.  Good peripheral circulation. Respiratory: Normal respiratory effort.  No retractions. Lungs CTAB. Gastrointestinal: Tender in the lower abdomen.. No distention. No abdominal bruits.  Musculoskeletal: No lower extremity tenderness nor edema.  No joint effusions. Neurologic: Patient appears confused.  Seems to have equal strength in arms and legs but does occasionally have word finding difficulties. Skin:  Skin is warm, dry and intact. No rash noted. Psychiatric: Mood and affect are normal.  GU: Deferred   ____________________________________________   LABS (all labs ordered are listed, but only abnormal results are displayed)  Labs Reviewed  CBC - Abnormal; Notable for the following components:      Result Value   RBC  5.17 (*)    MCH 25.9 (*)    All other components within normal limits  DIFFERENTIAL - Abnormal; Notable for the following components:   Neutro Abs 8.9 (*)    All other components within normal limits  COMPREHENSIVE METABOLIC PANEL - Abnormal; Notable for the following components:   Glucose, Bld 146 (*)    BUN 25 (*)    Creatinine, Ser 1.68 (*)    Alkaline Phosphatase 138 (*)    GFR, Estimated 36 (*)    All other components within normal limits  APTT - Abnormal; Notable for the following components:   aPTT 40 (*)    All other components within normal limits  PROTIME-INR  CBG MONITORING, ED  POC URINE PREG, ED   ____________________________________________   ED ECG REPORT I, Vanessa Parcelas [place], the attending physician, personally viewed and interpreted this ECG.  Normal sinus rate of 69, no ST elevation, no T wave versions, normal intervals ____________________________________________  RADIOLOGY   Official radiology report(s): CT ANGIO HEAD NECK W WO CM  Result Date: 12/05/2020 CLINICAL DATA:  Confusion EXAM: CT ANGIOGRAPHY HEAD AND NECK TECHNIQUE: Multidetector CT imaging of the head and neck was performed using the standard protocol during bolus administration of intravenous contrast. Multiplanar CT image reconstructions and MIPs were obtained to evaluate the vascular anatomy. Carotid stenosis measurements (when applicable) are obtained utilizing NASCET criteria, using the distal internal carotid diameter as the denominator. CONTRAST:  37m OMNIPAQUE IOHEXOL 350 MG/ML SOLN COMPARISON:  None. FINDINGS: CTA NECK FINDINGS SKELETON: There is no bony spinal canal stenosis. No lytic or blastic lesion. OTHER NECK: Normal pharynx, larynx and major salivary glands. No cervical lymphadenopathy. Unremarkable thyroid gland. UPPER CHEST: No pneumothorax or pleural effusion. No nodules or masses. AORTIC ARCH: Right-sided aortic arch. RIGHT CAROTID SYSTEM: No dissection, occlusion or aneurysm. Mild  atherosclerotic calcification at the carotid bifurcation without hemodynamically significant stenosis. LEFT CAROTID SYSTEM: Normal without aneurysm, dissection or stenosis. VERTEBRAL ARTERIES: Right dominant configuration. Both origins are clearly patent. Diffusely diminutive left vertebral artery. CTA HEAD FINDINGS POSTERIOR CIRCULATION: --Vertebral arteries: Atherosclerotic calcification of the right V4 segment with moderate narrowing. --Inferior cerebellar arteries: Normal. --Basilar artery: Normal. --Superior cerebellar arteries: Normal. --Posterior cerebral arteries (PCA): Normal. ANTERIOR CIRCULATION: --Intracranial internal carotid arteries: Atherosclerotic calcification of the internal carotid arteries at the skull base without hemodynamically significant stenosis. --Anterior cerebral arteries (ACA): Normal. Both A1 segments are present. Patent anterior communicating artery (a-comm). --Middle cerebral arteries (MCA): Normal. VENOUS SINUSES: As permitted by contrast timing, patent. ANATOMIC VARIANTS: None Review of the MIP images confirms the above findings. IMPRESSION: 1. No emergent large vessel occlusion. 2. Moderate narrowing of the right vertebral artery V4 segment secondary to atherosclerotic calcification. 3. Right-sided aortic arch. Electronically Signed   By: KUlyses JarredM.D.   On: 12/05/2020 22:01  CT HEAD WO CONTRAST  Result Date: 12/05/2020 CLINICAL DATA:  Altered mental status. EXAM: CT HEAD WITHOUT CONTRAST TECHNIQUE: Contiguous axial images were obtained from the base of the skull through the vertex without intravenous contrast. COMPARISON:  August 07, 2020 FINDINGS: Brain: There is mild cerebral atrophy with widening of the extra-axial spaces and ventricular dilatation. There are areas of decreased attenuation within the white matter tracts of the supratentorial brain, consistent with microvascular disease changes. Small chronic bilateral basal ganglia lacunar infarcts are seen. Vascular:  No hyperdense vessel or unexpected calcification. Skull: Normal. Negative for fracture or focal lesion. Sinuses/Orbits: No acute finding. Other: None. IMPRESSION: 1. Generalized cerebral atrophy. 2. Small chronic bilateral basal ganglia lacunar infarcts. 3. No acute intracranial abnormality. Electronically Signed   By: Virgina Norfolk M.D.   On: 12/05/2020 19:24   CT Cervical Spine Wo Contrast  Result Date: 12/05/2020 CLINICAL DATA:  Neck trauma, dangerous injury mechanism (Age 71-64y) EXAM: CT CERVICAL SPINE WITHOUT CONTRAST TECHNIQUE: Multidetector CT imaging of the cervical spine was performed without intravenous contrast. Multiplanar CT image reconstructions were also generated. COMPARISON:  10/12/2018 FINDINGS: Alignment: Normal Skull base and vertebrae: No acute fracture. No primary bone lesion or focal pathologic process. Soft tissues and spinal canal: No prevertebral fluid or swelling. No visible canal hematoma. Disc levels: Large flowing anterior osteophytes. Disc spaces maintained. Upper chest: No acute findings Other: None IMPRESSION: Degenerative spurring anteriorly. No acute bony abnormality. Electronically Signed   By: Rolm Baptise M.D.   On: 12/05/2020 21:33   MR BRAIN WO CONTRAST  Result Date: 12/05/2020 CLINICAL DATA:  Encephalopathy and weakness EXAM: MRI HEAD WITHOUT CONTRAST TECHNIQUE: Multiplanar, multiecho pulse sequences of the brain and surrounding structures were obtained without intravenous contrast. COMPARISON:  08/07/2020 FINDINGS: Brain: Small focus of abnormal diffusion restriction within the right middle cerebellar peduncle. No other diffusion abnormality. There are foci of chronic microhemorrhage within the brainstem, left frontal lobe and right temporal lobe. Hyperintense T2-weighted signal is moderately widespread throughout the white matter. Generalized volume loss without a clear lobar predilection. The midline structures are normal. Vascular: Major flow voids are  preserved. Skull and upper cervical spine: Normal calvarium and skull base. Visualized upper cervical spine and soft tissues are normal. Sinuses/Orbits:No paranasal sinus fluid levels or advanced mucosal thickening. No mastoid or middle ear effusion. Normal orbits. IMPRESSION: 1. Small focus of acute ischemia within the right middle cerebellar peduncle. No hemorrhage or mass effect. 2. Moderate chronic small vessel ischemic disease and Cerebral Atrophy (ICD10-G31.9). Electronically Signed   By: Ulyses Jarred M.D.   On: 12/05/2020 23:26   CT ABDOMEN PELVIS W CONTRAST  Result Date: 12/05/2020 CLINICAL DATA:  Abdominal distension EXAM: CT ABDOMEN AND PELVIS WITH CONTRAST TECHNIQUE: Multidetector CT imaging of the abdomen and pelvis was performed using the standard protocol following bolus administration of intravenous contrast. CONTRAST:  66m OMNIPAQUE IOHEXOL 350 MG/ML SOLN COMPARISON:  None. FINDINGS: Lower chest: No acute abnormality.  Coronary artery calcifications. Hepatobiliary: No focal liver abnormality is seen. Status post cholecystectomy. No biliary dilatation. Pancreas: No focal abnormality or ductal dilatation. Spleen: No focal abnormality.  Normal size. Adrenals/Urinary Tract: Scarring and cortical thinning in the kidneys bilaterally. No hydronephrosis. No renal or adrenal mass. Urinary bladder unremarkable. 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 Hyperdense area noted in the proximal duodenum in the area of prior hemorrhage measuring 1.3 cm. Cannot exclude duodenal lesion. No bowel obstruction. Vascular/Lymphatic: No evidence of aneurysm or adenopathy. Reproductive: Uterus and adnexa unremarkable.  No mass. Other: No free fluid or free air. Musculoskeletal: No acute bony abnormality. IMPRESSION: 1.3 cm hyperdense focus seen within the proximal duodenum in the area of prior hemorrhage. Cannot exclude duodenal wall lesion/mass. Consider GI consultation. No acute  findings in the abdomen or pelvis. Electronically Signed   By: Rolm Baptise M.D.   On: 12/05/2020 21:37    ____________________________________________   PROCEDURES  Procedure(s) performed (including Critical Care):  .1-3 Lead EKG Interpretation Performed by: Vanessa [place], MD Authorized by: Vanessa [place], MD     Interpretation: normal     ECG rate:  70s   ECG rate assessment: normal     Rhythm: sinus rhythm     Ectopy: none     Conduction: normal     ____________________________________________   INITIAL IMPRESSION / ASSESSMENT AND PLAN / ED COURSE  Jenavieve Kathaleen Dudziak was evaluated in Emergency Department on 12/05/2020 for the symptoms described in the history of present illness. She was evaluated in the context of the global COVID-19 pandemic, which necessitated consideration that the patient might be at risk for infection with the SARS-CoV-2 virus that causes COVID-19. Institutional protocols and algorithms that pertain to the evaluation of patients at risk for COVID-19 are in a state of rapid change based on information released by regulatory bodies including the CDC and federal and state organizations. These policies and algorithms were followed during the patient's care in the ED.  Exam with altered mental status, confusion.  Patient does have some word finding difficulties.  Stroke code was not called given out of the window.  Patient's last known normal was 11/2 at 7 PM.  She would still be in the window for LVO but her NIH stroke scale is not really high enough but will get a CTA just to ensure there is no evidence of LVO.  I got a CT abdomen to rule any kind of acute abdominal infections given some abdominal pain as well as a CTA given the possibility of TIA versus stroke versus recrudescence of prior stroke.  Labs were also ordered evaluate for any electrolyte abnormalities and urine evaluate for UTI   Labs are reassuring with normal white count.  Hemoglobin is stable.   Kidney function is around baseline glucose slightly elevated but normal anion gap.  CT head was negative other than the baseline findings  CT imaging is negative other than incidental finding of a hyperdense focus on the duodenum.  I did discuss this with the patients son  and that they need to get follow-up to make sure that this is not a Chief of Staff.  Patient remains confused therefore we will discussed with the hospital team for admission.  Her urine is positive for UTI so started on antibiotics for this.  I also ordered an MRI just to make sure there is no evidence of stroke      ____________________________________________   FINAL CLINICAL IMPRESSION(S) / ED DIAGNOSES   Final diagnoses:  Altered mental status, unspecified altered mental status type  Urinary tract infection without hematuria, site unspecified      MEDICATIONS GIVEN DURING THIS VISIT:  Medications  sodium chloride flush (NS) 0.9 % injection 3 mL (has no administration in time range)  amLODipine (NORVASC) tablet 2.5 mg (has no administration in time range)  carvedilol (COREG) tablet 12.5 mg (has no administration in time range)  furosemide (LASIX) tablet 20 mg (has no administration in time range)  isosorbide mononitrate (IMDUR) 24 hr tablet 120 mg (has no  administration in time range)  nitroGLYCERIN (NITROSTAT) SL tablet 0.4 mg (has no administration in time range)  rosuvastatin (CRESTOR) tablet 40 mg (has no administration in time range)  ALPRAZolam (XANAX) tablet 1 mg (has no administration in time range)  buPROPion ER (WELLBUTRIN SR) 12 hr tablet 100 mg (has no administration in time range)  desvenlafaxine (PRISTIQ) 24 hr tablet 50 mg (has no administration in time range)  memantine (NAMENDA) tablet 5 mg (has no administration in time range)  traZODone (DESYREL) tablet 100-200 mg (has no administration in time range)  Glucagon POWD 1 spray (has no administration in time range)  glucose chewable tablet 4 g  (has no administration in time range)  levothyroxine (SYNTHROID) tablet 25 mcg (has no administration in time range)  diphenoxylate-atropine (LOMOTIL) 2.5-0.025 MG per tablet 1 tablet (has no administration in time range)  estradiol (ESTRACE) vaginal cream 1 Applicatorful (has no administration in time range)  clopidogrel (PLAVIX) tablet 75 mg (has no administration in time range)  baclofen (LIORESAL) tablet 5 mg (has no administration in time range)  gabapentin (NEURONTIN) capsule 600 mg (has no administration in time range)  promethazine (PHENERGAN) tablet 25 mg (has no administration in time range)  diclofenac Sodium (VOLTAREN) 1 % topical gel 2 g (has no administration in time range)  polyethylene glycol 0.4% and propylene glycol 0.3% (SYSTANE) ophthalmic gel (has no administration in time range)  enoxaparin (LOVENOX) injection 40 mg (has no administration in time range)  0.9 %  sodium chloride infusion (has no administration in time range)  acetaminophen (TYLENOL) tablet 650 mg (has no administration in time range)    Or  acetaminophen (TYLENOL) suppository 650 mg (has no administration in time range)  traZODone (DESYREL) tablet 25 mg (has no administration in time range)  magnesium hydroxide (MILK OF MAGNESIA) suspension 30 mL (has no administration in time range)  ondansetron (ZOFRAN) tablet 4 mg (has no administration in time range)    Or  ondansetron (ZOFRAN) injection 4 mg (has no administration in time range)  iohexol (OMNIPAQUE) 350 MG/ML injection 60 mL (60 mLs Intravenous Contrast Given 12/05/20 2104)  cefTRIAXone (ROCEPHIN) 2 g in sodium chloride 0.9 % 100 mL IVPB (0 g Intravenous Stopped 12/05/20 2358)  LORazepam (ATIVAN) injection 1 mg (1 mg Intravenous Given 12/05/20 2049)     ED Discharge Orders     None        Note:  This document was prepared using Dragon voice recognition software and may include unintentional dictation errors.    Vanessa [place], MD 12/05/20  [phone]

## 2020-12-05 NOTE — ED Notes (Signed)
Attempted IV x 2 RFA. IV team consulted.

## 2020-12-05 NOTE — ED Notes (Signed)
Called tele neurology for consult on pt.

## 2020-12-05 NOTE — H&P (Signed)
[place]   PATIENT NAME: [name]    MR#:  [id number]  DATE OF BIRTH:  1970-02-08  DATE OF ADMISSION:  12/05/2020  PRIMARY CARE PHYSICIAN: Kendra Sons, MD   Patient is coming from: Home  REQUESTING/REFERRING PHYSICIAN: Marjean Donna, MD  CHIEF COMPLAINT:   Chief Complaint  Patient presents with  . Altered Mental Status    HISTORY OF PRESENT ILLNESS:  [name] is a 50 y.o. female with medical history significant for coronary artery disease, CVA, type 2 diabetes mellitus and hypertension, who presented to the emergency room with acute onset of altered mental status with increased slurred speech today and global generalized weakness.  The patient denied any focal muscle weakness or paresthesias.  She admitted to urinary frequency.  Urgency with associated dysuria and lower abdominal pain.  She has been having a headache and blurred vision.  No fever or chills.  No nausea or vomiting or abdominal pain.  No chest pain or palpitations.  No cough or wheezing or dyspnea.  ED Course: When she came to the ER, blood pressure was 187/76 and later 217/84 with otherwise normal vital signs. Labs revealed BUN of 25 and creatinine 1.68 compared to previous levels in July/2022.  Alk phos was 138 and otherwise CMP was within normal.  CBC was unremarkable.  Influenza antigens and COVID-19 PCR came back negative. EKG: Showed normal sinus rhythm with a rate of 69 with poor R progression and LVH. Imaging: C-spine CT showed degenerative spurring anteriorly with no acute abnormality.  Head and neck CTA revealed no emergent large vessel occlusion.  It showed moderate narrowing of the right vertebral artery V4 segment secondary to atherosclerotic calcification and showed right-sided aortic arch.  Abdominal and pelvic CTA revealed 1.3 cm hyperdense focus seen in the proximal duodenum in the area of prior hemorrhage with inability to exclude duodenal wall lesion/mass.  The patient  was given IV Rocephin and Ativan.  She will be admitted to a medical bed for further evaluation and management. PAST MEDICAL HISTORY:   Past Medical History:  Diagnosis Date  . Acute colitis 01/27/2017  . Acute pyelonephritis   . Acute upper GI bleed 01/25/2020  . Anemia    iron deficiency anemia  . Aortic arch aneurysm   . BRCA negative 2014  . CAD (coronary artery disease)    a. 08/2003 Cath: LAD 30-40-med Rx; b. 11/2014 PCI: LAD 87m(3.25x23 Xience Alpine DES); c. 06/2015 PCI: D1 (2.25x12 Resolute Integrity DES); d. 06/2017 PCI: Patent mLAD stent, D2 95 (PTCA); e. 09/2017 PCI: D2 99ost (CBA); d. 12/2017 Cath: LM nl, LAD 318m80d (small), D1 40ost, D2 95ost, LCX 40p, RCA 40ost/p->Med rx for D2 given restenosis.  . Colitis 06/03/2015  . Colon polyp   . CVA (cerebral vascular accident) ([facility]   Left side weakness.   . Degenerative tear of glenoid labrum of right shoulder 03/15/2017  . Diabetes mellitus without complication ([facility]  . Family history of breast cancer    BRCA neg 2014  . Femur fracture, left ([facility]9/10/2018  . Gastric ulcer 04/27/2011  . History of echocardiogram    a. 03/2017 Echo: EF 60-65%, no rwma; b. 02/2018 Echo: EF 60-65%, no rwma. Nl RV fxn. No cardiac source of emboli (admitted w/ stroke).  . Hypertension   . Malignant melanoma of skin of scalp ([facility]  . MI, acute, non ST segment elevation ([facility]  . Neuromuscular disorder ([facility]  .  S/P drug eluting coronary stent placement 06/04/2015  . Sepsis ([place]) 03/18/2017  . Sepsis secondary to UTI ([place]) 04/23/2020  . Stroke [facility])    a. 02/2018 MRI: 29m late acute/early subacute L medial frontal lobe inarct; b. 02/2018 MRA No large vessel occlusion or aneurysm. Mod to sev L P2 stenosis. thready L vertebral artery, diffusely dzs'd; c. 02/2018 Carotid U/S: <50% bilat ICA dzs.    PAST SURGICAL HISTORY:   Past Surgical History:  Procedure Laterality Date  . APPENDECTOMY    . BALLOON ENTEROSCOPY  02/06/2020   [facility]  . BIOPSY N/A  03/14/2020   Procedure: BIOPSY;  Surgeon: WLucilla Lame MD;  [facility]  Service: Endoscopy;  Laterality: N/A;  . CARDIAC CATHETERIZATION N/A 11/09/2014   Procedure: Coronary Angiography;  Surgeon: TMinna Merritts MD;  [place];  Service: Cardiovascular;  Laterality: N/A;  . CARDIAC CATHETERIZATION N/A 11/12/2014   Procedure: Coronary Stent Intervention;  Surgeon: AIsaias Cowman MD;  [place] [facility];  Service: Cardiovascular;  Laterality: N/A;  . CARDIAC CATHETERIZATION N/A 04/18/2015   Procedure: Left Heart Cath and Coronary Angiography;  Surgeon: TMinna Merritts MD;  [place] [facility];  Service: Cardiovascular;  Laterality: N/A;  . CARDIAC CATHETERIZATION Left 06/04/2015   Procedure: Left Heart Cath and Coronary Angiography;  Surgeon: MWellington Hampshire MD;  [facility];  Service: Cardiovascular;  Laterality: Left;  . CARDIAC CATHETERIZATION N/A 06/04/2015   Procedure: Coronary Stent Intervention;  Surgeon: MWellington Hampshire MD;  [place] [facility];  Service: Cardiovascular;  Laterality: N/A;  . CESAREAN SECTION  2001  . CHOLECYSTECTOMY N/A 11/18/2016   Procedure: LAPAROSCOPIC CHOLECYSTECTOMY WITH INTRAOPERATIVE CHOLANGIOGRAM;  Surgeon: SChristene Lye MD;  Location: [facility];  Service: General;  Laterality: N/A;  . COLONOSCOPY WITH PROPOFOL N/A 04/27/2016   Procedure: COLONOSCOPY WITH PROPOFOL;  Surgeon: DLucilla Lame MD;  [facility]  Service: Endoscopy;  Laterality: N/A;  . COLONOSCOPY WITH PROPOFOL N/A 01/12/2018   Procedure: COLONOSCOPY WITH PROPOFOL;  Surgeon: Toledo, TBenay Pike MD;  Location: [facility];  Service: Endoscopy;  Laterality: N/A;  . COLONOSCOPY WITH PROPOFOL N/A 03/14/2020   Procedure: COLONOSCOPY WITH PROPOFOL;  Surgeon: WLucilla Lame MD;  [place] [facility]  Service: Endoscopy;  Laterality: N/A;  Needs to be scheduled after 7:30 due  to driver issues  . CORONARY ANGIOPLASTY    . CORONARY BALLOON ANGIOPLASTY N/A 06/29/2017   Procedure: CORONARY BALLOON ANGIOPLASTY;  Surgeon: AWellington Hampshire MD;  [place];  Service: Cardiovascular;  Laterality: N/A;  . CORONARY BALLOON ANGIOPLASTY N/A 09/20/2017   Procedure: CORONARY BALLOON ANGIOPLASTY;  Surgeon: AWellington Hampshire MD;  [place] [facility];  Service: Cardiovascular;  Laterality: N/A;  . CORONARY STENT INTERVENTION N/A 12/13/2019   Procedure: CORONARY STENT INTERVENTION;  Surgeon: AWellington Hampshire MD;  Location: [facility];  Service: Cardiovascular;  Laterality: N/A;  . DILATION AND CURETTAGE OF UTERUS    . ESOPHAGOGASTRODUODENOSCOPY (EGD) WITH PROPOFOL N/A 09/14/2014   Procedure: ESOPHAGOGASTRODUODENOSCOPY (EGD) WITH PROPOFOL;  Surgeon: MJosefine Class MD;  [facility];  Service: Endoscopy;  Laterality: N/A;  . ESOPHAGOGASTRODUODENOSCOPY (EGD) WITH PROPOFOL N/A 04/27/2016   Procedure: ESOPHAGOGASTRODUODENOSCOPY (EGD) WITH PROPOFOL;  Surgeon: DLucilla Lame MD;  [place] [place]  Service: Endoscopy;  Laterality: N/A;  Diabetic - oral meds  . ESOPHAGOGASTRODUODENOSCOPY (EGD) WITH PROPOFOL N/A 01/12/2018   Procedure: ESOPHAGOGASTRODUODENOSCOPY (EGD) WITH  PROPOFOL;  Surgeon: Toledo, Benay Pike, MD;  Location: [facility];  Service: Endoscopy;  Laterality: N/A;  . ESOPHAGOGASTRODUODENOSCOPY (EGD) WITH PROPOFOL N/A 04/11/2019   Procedure: ESOPHAGOGASTRODUODENOSCOPY (EGD) WITH PROPOFOL;  Surgeon: [name], MD;  Location: [facility];  Service: Endoscopy;  Laterality: N/A;  . ESOPHAGOGASTRODUODENOSCOPY (EGD) WITH PROPOFOL N/A 03/08/2020   Procedure: ESOPHAGOGASTRODUODENOSCOPY (EGD) WITH PROPOFOL;  Surgeon: [name], MD;  Location[facility];  Service: Endoscopy;  Laterality: N/A;  . GASTRIC BYPASS  09/2009   [place] (IM) NAIL INTERTROCHANTERIC Left 10/13/2018    Procedure: INTRAMEDULLARY (IM) NAIL INTERTROCHANTRIC;  Surgeon: Leandrew Koyanagi, MD;  Location: [place];  Service: Orthopedics;  Laterality: Left;  . Left Carotid to sublcavian artery bypass w/ subclavian artery ligation     a. Performed @ [place].  . LEFT HEART CATH AND CORONARY ANGIOGRAPHY Left 06/29/2017   Procedure: LEFT HEART CATH AND CORONARY ANGIOGRAPHY;  Surgeon: Wellington Hampshire, MD;  Location: [place] CV LAB;  Service: Cardiovascular;  Laterality: Left;  . LEFT HEART CATH AND CORONARY ANGIOGRAPHY N/A 09/20/2017   Procedure: LEFT HEART CATH AND CORONARY ANGIOGRAPHY;  Surgeon: Wellington Hampshire, MD;  Location: [place] CV LAB;  Service: Cardiovascular;  Laterality: N/A;  . LEFT HEART CATH AND CORONARY ANGIOGRAPHY N/A 12/20/2017   Procedure: LEFT HEART CATH AND CORONARY ANGIOGRAPHY;  Surgeon: Wellington Hampshire, MD;  Location: [place] CV LAB;  Service: Cardiovascular;  Laterality: N/A;  . LEFT HEART CATH AND CORONARY ANGIOGRAPHY N/A 04/20/2019   Procedure: LEFT HEART CATH AND CORONARY ANGIOGRAPHY possible PCI;  Surgeon: Minna Merritts, MD;  Location: [place] CV LAB;  Service: Cardiovascular;  Laterality: N/A;  . LEFT HEART CATH AND CORONARY ANGIOGRAPHY N/A 12/13/2019   Procedure: LEFT HEART CATH AND CORONARY ANGIOGRAPHY;  Surgeon: Wellington Hampshire, MD;  Location: [place] CV LAB;  Service: Cardiovascular;  Laterality: N/A;  . MELANOMA EXCISION  2016   Dr. Evorn Gong  . [place]  2002  . RIGHT OOPHORECTOMY    . SHOULDER ARTHROSCOPY WITH OPEN ROTATOR CUFF REPAIR Right 01/07/2016   Procedure: SHOULDER ARTHROSCOPY WITH DEBRIDMENT, SUBACHROMIAL DECOMPRESSION;  Surgeon: Corky Mull, MD;  Location: [facility];  Service: Orthopedics;  Laterality: Right;  . SHOULDER ARTHROSCOPY WITH OPEN ROTATOR CUFF REPAIR Right 03/16/2017   Procedure: SHOULDER ARTHROSCOPY WITH OPEN ROTATOR CUFF REPAIR POSSIBLE BICEPS TENODESIS;  Surgeon: Corky Mull, MD;  Location: [facility];  Service:  Orthopedics;  Laterality: Right;  . TRIGGER FINGER RELEASE Right     [place]    SOCIAL HISTORY:   Social History   Tobacco Use  . Smoking status: Former    Packs/day: 0.25    Years: 1.00    Pack years: 0.25    Types: Cigarettes    Quit date: 08/31/1994    Years since quitting: 26.2  . Smokeless tobacco: Never  . Tobacco comments:    quit 28 years ago  Substance Use Topics  . Alcohol use: No    Alcohol/week: 0.0 standard drinks    FAMILY HISTORY:   Family History  Problem Relation Age of Onset  . Hypertension Mother   . Anxiety disorder Mother   . Depression Mother   . Bipolar disorder Mother   . Heart disease Mother        No details  . Hyperlipidemia Mother   . Kidney disease Father   . Heart disease Father 25  . Hypertension Father   . Diabetes  Father   . Stroke Father   . Colon cancer Father        dx in his 15's  . Anxiety disorder Father   . Depression Father   . Skin cancer Father   . Kidney disease Sister   . Thyroid nodules Sister   . Hypertension Sister   . Hypertension Sister   . Diabetes Sister   . Hyperlipidemia Sister   . Depression Sister   . Breast cancer Maternal Aunt 65  . Breast cancer Maternal Aunt 38  . Ovarian cancer Cousin   . Colon cancer Cousin   . Kidney cancer Cousin   . Breast cancer Other   . Bladder Cancer Neg Hx     DRUG ALLERGIES:   Allergies  Allergen Reactions  . Lipitor [Atorvastatin] Other (See Comments)    Leg pains    REVIEW OF SYSTEMS:   ROS As per history of present illness. All pertinent systems were reviewed above. Constitutional, HEENT, cardiovascular, respiratory, GI, GU, musculoskeletal, neuro, psychiatric, endocrine, integumentary and hematologic systems were reviewed and are otherwise negative/unremarkable except for positive findings mentioned above in the HPI.   MEDICATIONS AT HOME:   Prior to Admission medications   Medication Sig Start Date End Date Taking? Authorizing Provider   AIMOVIG 140 MG/ML SOAJ Inject into the skin. 10/14/20  Yes [provider]  ALPRAZolam Duanne Moron) 1 MG tablet Take 1 tablet by mouth twice daily 11/26/20  Yes Kendra Sons, MD  amLODipine (NORVASC) 2.5 MG tablet Take 2.5 mg by mouth daily. 07/11/20  Yes [provider]  BAQSIMI ONE PACK 3 MG/DOSE POWD Place 1 spray into both nostrils as directed. 02/11/20  Yes [provider]  buPROPion (WELLBUTRIN SR) 100 MG 12 hr tablet Take 100 mg by mouth 2 (two) times daily. 05/03/20  Yes [provider]  carvedilol (COREG) 12.5 MG tablet Take 1 tablet by mouth twice daily 08/21/20  Yes Gollan, Kathlene November, MD  clopidogrel (PLAVIX) 75 MG tablet Take 1 tablet (75 mg total) by mouth daily. 11/20/20  Yes Furth, Cadence H, PA-C  desvenlafaxine (PRISTIQ) 50 MG 24 hr tablet Take 50 mg by mouth daily. 11/26/20  Yes [provider]  Desvenlafaxine ER (PRISTIQ) 50 MG TB24 Take 1 tablet (50 mg total) by mouth every morning. 07/24/20  Yes Fisher, Kirstie Peri, MD  diclofenac Sodium (VOLTAREN) 1 % GEL Apply 2 g topically 4 (four) times daily.   Yes [provider]  diphenoxylate-atropine (LOMOTIL) 2.5-0.025 MG tablet Take 1 tablet by mouth 4 (four) times daily as needed for diarrhea or loose stools. 07/03/20  Yes [name], MD  estradiol (ESTRACE) 0.1 MG/GM vaginal cream Estrogen Cream Instruction Discard applicator Apply pea sized amount to tip of finger to urethra before bed. Wash hands well after application. Use Monday, Wednesday and Friday 05/08/20  Yes Billey Co, MD  furosemide (LASIX) 20 MG tablet Take 1 tablet (20 mg total) by mouth as needed (As needed for swelling). 11/20/20 02/18/21 Yes Furth, Cadence H, PA-C  gabapentin (NEURONTIN) 300 MG capsule Take 2 capsules (600 mg total) by mouth 2 (two) times daily. 07/24/20  Yes Kendra Sons, MD  glucose 4 GM chewable tablet Chew 1 tablet (4 g total) by mouth 3 (three) times daily. 04/25/20  Yes Fritzi Mandes, MD  isosorbide  mononitrate (IMDUR) 120 MG 24 hr tablet Take 1 tablet by mouth every day 10/28/20  Yes Gollan, Kathlene November, MD  levothyroxine (SYNTHROID) 25 MCG tablet Take 1  tablet by mouth 30 minutes before breakfast 11/26/20  Yes Fisher, Kirstie Peri, MD  memantine (NAMENDA) 5 MG tablet Take 5 mg by mouth 2 (two) times daily. 05/03/20  Yes [provider]  Polyethyl Glycol-Propyl Glycol (SYSTANE OP) Place 1 drop into both eyes daily as needed (dry eyes).   Yes [provider]  promethazine (PHENERGAN) 25 MG tablet Take 1 tablet (25 mg total) by mouth every 6 (six) hours as needed for nausea or vomiting. 07/02/20  Yes Kendra Sons, MD  rosuvastatin (CRESTOR) 40 MG tablet Take 1 tablet (40 mg total) by mouth daily at 6 PM. 10/25/19  Yes Gollan, Kathlene November, MD  traZODone (DESYREL) 100 MG tablet Take 1 to 2 tablets by mouth at bedtime 08/21/20  Yes Kendra Sons, MD  Baclofen 5 MG TABS Take 1 tablet by mouth 2 (two) times daily as needed. 11/26/20   [provider]  nitroGLYCERIN (NITROSTAT) 0.4 MG SL tablet Place 1 tablet (0.4 mg total) under the tongue every 5 (five) minutes as needed for chest pain. Do not exceed 3 dose within 30 mins 10/04/20   Minna Merritts, MD  traMADol (ULTRAM) 50 MG tablet Take 50 mg by mouth every 6 (six) hours as needed. Patient not taking: No sig reported 06/24/20   [provider]  Ubrogepant (UBRELVY) 100 MG TABS Take 100 mg by mouth as needed (migraine). Max of 2 tablets daily Patient not taking: No sig reported 05/03/20   [provider]      VITAL SIGNS:  Blood pressure (!) 217/84, pulse 72, temperature 98.4 F (36.9 C), temperature source Oral, resp. rate 17, height _0  (1.575 m), weight 72.5 kg, SpO2 99 %.  PHYSICAL EXAMINATION:  Physical Exam  GENERAL:  50 y.o.-year-old patient lying in the bed with no acute distress.  EYES: Pupils equal, round, reactive to light and accommodation. No scleral icterus. Extraocular muscles intact.   HEENT: Head atraumatic, normocephalic. Oropharynx and nasopharynx clear.  NECK:  Supple, no jugular venous distention. No thyroid enlargement, no tenderness.  LUNGS: Normal breath sounds bilaterally, no wheezing, rales,rhonchi or crepitation. No use of accessory muscles of respiration.  CARDIOVASCULAR: Regular rate and rhythm, S1, S2 normal. No murmurs, rubs, or gallops.  ABDOMEN: Soft, nondistended, with lower abdominal suprapubic tenderness without rebound tenderness guarding or rigidity.. Bowel sounds present. No organomegaly or mass.  EXTREMITIES: No pedal edema, cyanosis, or clubbing.  NEUROLOGIC: Cranial nerves II through XII are intact. Muscle strength 5/5 in all extremities. Sensation intact. Gait not checked.  PSYCHIATRIC: The patient is alert and oriented x 3.  Normal affect and good eye contact. SKIN: No obvious rash, lesion, or ulcer.   LABORATORY PANEL:   CBC Recent Labs  Lab 12/05/20 1844  WBC 10.4  HGB 13.4  HCT 41.9  PLT 186   ------------------------------------------------------------------------------------------------------------------  Chemistries  Recent Labs  Lab 12/05/20 1844  NA 140  K 4.5  CL 106  CO2 24  GLUCOSE 146*  BUN 25*  CREATININE 1.68*  CALCIUM 9.4  AST 19  ALT 13  ALKPHOS 138*  BILITOT 0.9   ------------------------------------------------------------------------------------------------------------------  Cardiac Enzymes No results for input(s): TROPONINI in the last 168 hours. ------------------------------------------------------------------------------------------------------------------  RADIOLOGY:  CT ANGIO HEAD NECK W WO CM  Result Date: 12/05/2020 CLINICAL DATA:  Confusion EXAM: CT ANGIOGRAPHY HEAD AND NECK TECHNIQUE: Multidetector CT imaging of the head and neck was performed using the standard protocol during bolus administration of intravenous contrast. Multiplanar CT image reconstructions and MIPs  were obtained to  evaluate the vascular anatomy. Carotid stenosis measurements (when applicable) are obtained utilizing NASCET criteria, using the distal internal carotid diameter as the denominator. CONTRAST:  34m OMNIPAQUE IOHEXOL 350 MG/ML SOLN COMPARISON:  None. FINDINGS: CTA NECK FINDINGS SKELETON: There is no bony spinal canal stenosis. No lytic or blastic lesion. OTHER NECK: Normal pharynx, larynx and major salivary glands. No cervical lymphadenopathy. Unremarkable thyroid gland. UPPER CHEST: No pneumothorax or pleural effusion. No nodules or masses. AORTIC ARCH: Right-sided aortic arch. RIGHT CAROTID SYSTEM: No dissection, occlusion or aneurysm. Mild atherosclerotic calcification at the carotid bifurcation without hemodynamically significant stenosis. LEFT CAROTID SYSTEM: Normal without aneurysm, dissection or stenosis. VERTEBRAL ARTERIES: Right dominant configuration. Both origins are clearly patent. Diffusely diminutive left vertebral artery. CTA HEAD FINDINGS POSTERIOR CIRCULATION: --Vertebral arteries: Atherosclerotic calcification of the right V4 segment with moderate narrowing. --Inferior cerebellar arteries: Normal. --Basilar artery: Normal. --Superior cerebellar arteries: Normal. --Posterior cerebral arteries (PCA): Normal. ANTERIOR CIRCULATION: --Intracranial internal carotid arteries: Atherosclerotic calcification of the internal carotid arteries at the skull base without hemodynamically significant stenosis. --Anterior cerebral arteries (ACA): Normal. Both A1 segments are present. Patent anterior communicating artery (a-comm). --Middle cerebral arteries (MCA): Normal. VENOUS SINUSES: As permitted by contrast timing, patent. ANATOMIC VARIANTS: None Review of the MIP images confirms the above findings. IMPRESSION: 1. No emergent large vessel occlusion. 2. Moderate narrowing of the right vertebral artery V4 segment secondary to atherosclerotic calcification. 3. Right-sided aortic arch. Electronically Signed   By:  KUlyses JarredM.D.   On: 12/05/2020 22:01   CT HEAD WO CONTRAST  Result Date: 12/05/2020 CLINICAL DATA:  Altered mental status. EXAM: CT HEAD WITHOUT CONTRAST TECHNIQUE: Contiguous axial images were obtained from the base of the skull through the vertex without intravenous contrast. COMPARISON:  August 07, 2020 FINDINGS: Brain: There is mild cerebral atrophy with widening of the extra-axial spaces and ventricular dilatation. There are areas of decreased attenuation within the white matter tracts of the supratentorial brain, consistent with microvascular disease changes. Small chronic bilateral basal ganglia lacunar infarcts are seen. Vascular: No hyperdense vessel or unexpected calcification. Skull: Normal. Negative for fracture or focal lesion. Sinuses/Orbits: No acute finding. Other: None. IMPRESSION: 1. Generalized cerebral atrophy. 2. Small chronic bilateral basal ganglia lacunar infarcts. 3. No acute intracranial abnormality. Electronically Signed   By: TVirgina NorfolkM.D.   On: 12/05/2020 19:24   CT Cervical Spine Wo Contrast  Result Date: 12/05/2020 CLINICAL DATA:  Neck trauma, dangerous injury mechanism (Age 52-64y EXAM: CT CERVICAL SPINE WITHOUT CONTRAST TECHNIQUE: Multidetector CT imaging of the cervical spine was performed without intravenous contrast. Multiplanar CT image reconstructions were also generated. COMPARISON:  10/12/2018 FINDINGS: Alignment: Normal Skull base and vertebrae: No acute fracture. No primary bone lesion or focal pathologic process. Soft tissues and spinal canal: No prevertebral fluid or swelling. No visible canal hematoma. Disc levels: Large flowing anterior osteophytes. Disc spaces maintained. Upper chest: No acute findings Other: None IMPRESSION: Degenerative spurring anteriorly. No acute bony abnormality. Electronically Signed   By: KRolm BaptiseM.D.   On: 12/05/2020 21:33   CT ABDOMEN PELVIS W CONTRAST  Result Date: 12/05/2020 CLINICAL DATA:  Abdominal distension  EXAM: CT ABDOMEN AND PELVIS WITH CONTRAST TECHNIQUE: Multidetector CT imaging of the abdomen and pelvis was performed using the standard protocol following bolus administration of intravenous contrast. CONTRAST:  661mOMNIPAQUE IOHEXOL 350 MG/ML SOLN COMPARISON:  None. FINDINGS: Lower chest: No acute abnormality.  Coronary artery calcifications. Hepatobiliary: No focal liver abnormality is seen. Status post  cholecystectomy. No biliary dilatation. Pancreas: No focal abnormality or ductal dilatation. Spleen: No focal abnormality.  Normal size. Adrenals/Urinary Tract: Scarring and cortical thinning in the kidneys bilaterally. No hydronephrosis. No renal or adrenal mass. Urinary bladder unremarkable. 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 Hyperdense area noted in the proximal duodenum in the area of prior hemorrhage measuring 1.3 cm. Cannot exclude duodenal lesion. No bowel obstruction. Vascular/Lymphatic: No evidence of aneurysm or adenopathy. Reproductive: Uterus and adnexa unremarkable.  No mass. Other: No free fluid or free air. Musculoskeletal: No acute bony abnormality. IMPRESSION: 1.3 cm hyperdense focus seen within the proximal duodenum in the area of prior hemorrhage. Cannot exclude duodenal wall lesion/mass. Consider GI consultation. No acute findings in the abdomen or pelvis. Electronically Signed   By: Rolm Baptise M.D.   On: 12/05/2020 21:37      IMPRESSION AND PLAN:  Active Problems:   Altered mental status  1.  Acute metabolic encephalopathy like secondary to UTI. - The patient will be admitted to an observation medical monitored bed. - We will continue IV Rocephin. - We will follow urine culture and sensitivity. - We will follow neurochecks every 4 hours for 24 hours.  2.  Slurred speech, rule out TIA/evolving CVA. - We will obtain a brain MRI without contrast. - Neurochecks will be followed every 4 hours for 24 hours. - PT and ST consults will be  obtained. - The patient will be placed on aspirin.  3.  Hypertensive urgency. - The patient will be placed on as needed IV labetalol and continue her antihypertensives. - We will allow permissive parameters while ruling out CVA.  4.  Depression and anxiety. - We will continue Wellbutrin XL, Pristiq and Xanax.  5.  Type 2 diabetes mellitus with peripheral neuropathy. - The patient will be placed on supplement coverage with NovoLog. - We will continue Neurontin.  6.  Dyslipidemia. - We will continue statin therapy and check fasting lipids.  7.  Hypothyroidism. - We will continue Synthroid.  8.  Coronary artery disease. - We will continue Imdur and Coreg.  DVT prophylaxis: Lovenox.  Code Status: full code.  Family Communication:  The plan of care was discussed in details with the patient (and family). I answered all questions. The patient agreed to proceed with the above mentioned plan. Further management will depend upon hospital course. Disposition Plan: Back to previous home environment Consults called: none.  All the records are reviewed and case discussed with ED provider.  Status is: Observation  Remains inpatient appropriate because:Ongoing diagnostic testing needed not appropriate for outpatient work up, Unsafe d/c plan, IV treatments appropriate due to intensity of illness or inability to take PO, and Inpatient level of care appropriate due to severity of illness   Dispo: The patient is from: Home              Anticipated d/c is to: Home              Patient currently is not medically stable to d/c.              Difficult to place patient: No  TOTAL TIME TAKING CARE OF THIS PATIENT: 55 minutes.     Christel Mormon M.D on 12/05/2020 at 11:00 PM  [facility]   From 7 PM-7 AM, contact night-coverage [email or web address]  CC: Primary care physician; Kendra Sons, MD

## 2020-12-05 NOTE — ED Triage Notes (Signed)
Pt BIB by family for increased confusion. Pt has HX of stroke. Son is a Airline pilot so he brought her in. Weakness on one side from previous stroke along with slurred speech. Pt last known well was last night. Son came home today at [place] and pt was very confused and unable to answer normal questions. Pt has not had any of her PO medication today. In no acute distress.

## 2020-12-06 ENCOUNTER — Ambulatory Visit: Payer: Medicare Other

## 2020-12-06 DIAGNOSIS — I10 Essential (primary) hypertension: Secondary | ICD-10-CM | POA: Diagnosis present

## 2020-12-06 DIAGNOSIS — G9341 Metabolic encephalopathy: Secondary | ICD-10-CM | POA: Diagnosis present

## 2020-12-06 DIAGNOSIS — Z888 Allergy status to other drugs, medicaments and biological substances status: Secondary | ICD-10-CM | POA: Diagnosis not present

## 2020-12-06 DIAGNOSIS — R45851 Suicidal ideations: Secondary | ICD-10-CM | POA: Diagnosis present

## 2020-12-06 DIAGNOSIS — R4182 Altered mental status, unspecified: Secondary | ICD-10-CM

## 2020-12-06 DIAGNOSIS — I6389 Other cerebral infarction: Secondary | ICD-10-CM | POA: Diagnosis present

## 2020-12-06 DIAGNOSIS — Z79899 Other long term (current) drug therapy: Secondary | ICD-10-CM | POA: Diagnosis not present

## 2020-12-06 DIAGNOSIS — I639 Cerebral infarction, unspecified: Secondary | ICD-10-CM | POA: Diagnosis not present

## 2020-12-06 DIAGNOSIS — Z818 Family history of other mental and behavioral disorders: Secondary | ICD-10-CM | POA: Diagnosis not present

## 2020-12-06 DIAGNOSIS — Z79818 Long term (current) use of other agents affecting estrogen receptors and estrogen levels: Secondary | ICD-10-CM | POA: Diagnosis not present

## 2020-12-06 DIAGNOSIS — Z20822 Contact with and (suspected) exposure to covid-19: Secondary | ICD-10-CM | POA: Diagnosis present

## 2020-12-06 DIAGNOSIS — B9620 Unspecified Escherichia coli [E. coli] as the cause of diseases classified elsewhere: Secondary | ICD-10-CM | POA: Diagnosis present

## 2020-12-06 DIAGNOSIS — E039 Hypothyroidism, unspecified: Secondary | ICD-10-CM | POA: Diagnosis present

## 2020-12-06 DIAGNOSIS — Z87891 Personal history of nicotine dependence: Secondary | ICD-10-CM | POA: Diagnosis not present

## 2020-12-06 DIAGNOSIS — I161 Hypertensive emergency: Secondary | ICD-10-CM | POA: Diagnosis present

## 2020-12-06 DIAGNOSIS — Z85820 Personal history of malignant melanoma of skin: Secondary | ICD-10-CM | POA: Diagnosis not present

## 2020-12-06 DIAGNOSIS — Z955 Presence of coronary angioplasty implant and graft: Secondary | ICD-10-CM | POA: Diagnosis not present

## 2020-12-06 DIAGNOSIS — N390 Urinary tract infection, site not specified: Secondary | ICD-10-CM | POA: Diagnosis present

## 2020-12-06 DIAGNOSIS — Z7902 Long term (current) use of antithrombotics/antiplatelets: Secondary | ICD-10-CM | POA: Diagnosis not present

## 2020-12-06 DIAGNOSIS — I2510 Atherosclerotic heart disease of native coronary artery without angina pectoris: Secondary | ICD-10-CM | POA: Diagnosis present

## 2020-12-06 DIAGNOSIS — E1142 Type 2 diabetes mellitus with diabetic polyneuropathy: Secondary | ICD-10-CM | POA: Diagnosis present

## 2020-12-06 DIAGNOSIS — I252 Old myocardial infarction: Secondary | ICD-10-CM | POA: Diagnosis not present

## 2020-12-06 DIAGNOSIS — Z79890 Hormone replacement therapy (postmenopausal): Secondary | ICD-10-CM | POA: Diagnosis not present

## 2020-12-06 DIAGNOSIS — I69351 Hemiplegia and hemiparesis following cerebral infarction affecting right dominant side: Secondary | ICD-10-CM | POA: Diagnosis not present

## 2020-12-06 DIAGNOSIS — Z8249 Family history of ischemic heart disease and other diseases of the circulatory system: Secondary | ICD-10-CM | POA: Diagnosis not present

## 2020-12-06 DIAGNOSIS — F331 Major depressive disorder, recurrent, moderate: Secondary | ICD-10-CM | POA: Diagnosis present

## 2020-12-06 LAB — CBG MONITORING, ED
Glucose-Capillary: 126 mg/dL — ABNORMAL HIGH (ref 70–99)
Glucose-Capillary: 105 mg/dL — ABNORMAL HIGH (ref 70–99)
Glucose-Capillary: 192 mg/dL — ABNORMAL HIGH (ref 70–99)
Glucose-Capillary: 169 mg/dL — ABNORMAL HIGH (ref 70–99)
Glucose-Capillary: 89 mg/dL (ref 70–99)

## 2020-12-06 LAB — LIPID PANEL
Cholesterol: 179 mg/dL (ref 0–200)
HDL: 28 mg/dL — ABNORMAL LOW (ref >40)
LDL Cholesterol: 124 mg/dL — ABNORMAL HIGH (ref 0–99)
Total CHOL/HDL Ratio: 6.4 RATIO
Triglycerides: 137 mg/dL (ref <150)
VLDL: 27 mg/dL (ref 0–40)

## 2020-12-06 LAB — CBC
HCT: 37 % (ref 36.0–46.0)
Hemoglobin: 11.9 g/dL — ABNORMAL LOW (ref 12.0–15.0)
MCH: 26 pg (ref 26.0–34.0)
MCHC: 32.2 g/dL (ref 30.0–36.0)
MCV: 80.8 fL (ref 80.0–100.0)
Platelets: 162 10*3/uL (ref 150–400)
RBC: 4.58 MIL/uL (ref 3.87–5.11)
RDW: 13.9 % (ref 11.5–15.5)
WBC: 8.5 10*3/uL (ref 4.0–10.5)
nRBC: 0 % (ref 0.0–0.2)

## 2020-12-06 LAB — HEMOGLOBIN A1C
Hgb A1c MFr Bld: 5.3 % (ref 4.8–5.6)
Mean Plasma Glucose: 105.41 mg/dL

## 2020-12-06 LAB — BASIC METABOLIC PANEL
Anion gap: 10 (ref 5–15)
BUN: 23 mg/dL — ABNORMAL HIGH (ref 6–20)
CO2: 26 mmol/L (ref 22–32)
Calcium: 9 mg/dL (ref 8.9–10.3)
Chloride: 105 mmol/L (ref 98–111)
Creatinine, Ser: 1.51 mg/dL — ABNORMAL HIGH (ref 0.44–1.00)
GFR, Estimated: 41 mL/min — ABNORMAL LOW (ref >60)
Glucose, Bld: 105 mg/dL — ABNORMAL HIGH (ref 70–99)
Potassium: 4.3 mmol/L (ref 3.5–5.1)
Sodium: 141 mmol/L (ref 135–145)

## 2020-12-06 MED ORDER — STROKE: EARLY STAGES OF RECOVERY BOOK: Freq: Once | Status: DC

## 2020-12-06 MED ORDER — LABETALOL HCL 5 MG/ML IV SOLN: 20.0000 mg | INTRAVENOUS | Status: DC | PRN

## 2020-12-06 MED ORDER — INSULIN ASPART 100 UNIT/ML IJ SOLN
0.0000 [IU] | Freq: Three times a day (TID) | INTRAMUSCULAR | Status: DC
  Administered 2020-12-06 (×2): 2 [IU] via SUBCUTANEOUS
  Administered 2020-12-10: 12:00:00 1 [IU] via SUBCUTANEOUS
  Filled 2020-12-06 (×3): qty 1

## 2020-12-06 MED ORDER — SODIUM CHLORIDE 0.9 % IV SOLN
1.0000 g | INTRAVENOUS | Status: AC
  Administered 2020-12-07 – 2020-12-09 (×3): 1 g via INTRAVENOUS
  Filled 2020-12-06 (×3): qty 10

## 2020-12-06 MED ORDER — ASPIRIN EC 81 MG PO TBEC
81.0000 mg | DELAYED_RELEASE_TABLET | Freq: Every day | ORAL | Status: DC
  Administered 2020-12-06 – 2020-12-10 (×5): 81 mg via ORAL
  Filled 2020-12-06 (×5): qty 1

## 2020-12-06 MED ORDER — GLUCOSE 4 G PO CHEW
1.0000 | CHEWABLE_TABLET | Freq: Three times a day (TID) | ORAL | Status: DC | PRN
  Filled 2020-12-06: qty 1

## 2020-12-06 NOTE — Progress Notes (Signed)
SLP Cancellation Note  Patient Details Name: [name] MRN: [id number] DOB: 06/30/1970   Cancelled treatment:       Reason Eval/Treat Not Completed: SLP screened, no needs identified, will sign off (No Acute needs currently -- has already been recommended for Home Health Speech therapy by Neurologist in 11/2020 (not completed yet per note).) Pt has been seen by Speech Therapy services during past admits and recommended f/u Outpatient for any further needs. Pt's Neurologist, Dr. Manuella Ghazi, on 11/2020, recommended same stating pt's Dx of: "Mild to Moderate Cognitive Impairment in patient with history of multiple recurrent strokes + severe white matter microvascular ischemic and metabolic changes - concern for underlying Vascular Dementia", Pt is on Namenda.  Pt is verbally making wants/needs known to NSG/staff and following general instructions.  As for higher level Cognitive tasks, she may have deficits and would need f/u Outpatient or at SNF if she desires to address higher level skills/needs. Recommend 24/7 Supervision for Safety at this time d/t Cognitive decline dx'd by Neurologist. Pt is eating/drinking w/out deficits noted by NSG; pt was drinking Diet Sprite in room w/ this Clinician w/out overt s/s of aspiration noted.  No further Acute needs indicated at this time. NSG/MD to reconsult if any new needs arise while admitted.        [name], MS, CCC-SLP Speech Language Pathologist Rehab Services [phone] [facility] 12/06/2020, 11:56 AM

## 2020-12-06 NOTE — ED Notes (Signed)
OT at bedside. 

## 2020-12-06 NOTE — ED Notes (Addendum)
Pt still confused at this time and in NAD- pt c/o nausea (See MAR)

## 2020-12-06 NOTE — ED Notes (Signed)
Pharmacy messaged for missing wellbutrin.

## 2020-12-06 NOTE — Evaluation (Signed)
Occupational Therapy Evaluation Patient Details Name: [name] MRN: [id number] DOB: [birth year] Today's Date: 12/06/2020   History of Present Illness [name] is a 50 y.o. female with medical history significant for coronary artery disease, CVA with R sided deficits, type 2 diabetes mellitus and hypertension, who presented to the emergency room with acute onset of altered mental status with increased slurred speech today and global generalized weakness. Head and neck CTA revealed no emergent large vessel occlusion.   Clinical Impression   Pt was seen for OT evaluation this date. Prior to hospital admission, pt was living with her son (works) and was indep with ADL, using a SPC for short community distances, and holding onto her son or the walls when ambulating inside. Pt reports having a walker but not using it "because it makes me look old." Pt demos cognitive impairments in problem solving, safety, awareness of deficits, STM, as well as impaired balance, strength, and with recent R wrist fracture. Currently pt's impairments as described below (See OT problem list) functionally limit her ability to perform ADL/self-care tasks safely, requiring MIN A for UB and LB ADL tasks, CGA for ADL transfers + AD. Pt would benefit from skilled OT services to address noted impairments and functional limitations (see below for any additional details) in order to maximize safety and independence while minimizing falls risk and caregiver burden. Upon hospital discharge, recommend STR to maximize pt safety and return to PLOF. Pt unsafe to return home without 24/7 supervision/assist.    Recommendations for follow up therapy are one component of a multi-disciplinary discharge planning process, led by the attending physician.  Recommendations may be updated based on patient status, additional functional criteria and insurance authorization.   Follow Up Recommendations  Skilled nursing-short term  rehab (<3 hours/day)    Assistance Recommended at Discharge Frequent or constant Supervision/Assistance  Functional Status Assessment  Patient has had a recent decline in their functional status and demonstrates the ability to make significant improvements in function in a reasonable and predictable amount of time.  Equipment Recommendations  [facility]    Recommendations for Other Services       Precautions / Restrictions Precautions Precautions: Fall Restrictions Weight Bearing Restrictions: No      Mobility Bed Mobility Overal bed mobility: Needs Assistance Bed Mobility: Supine to Sit;Sit to Supine     Supine to sit: Min assist Sit to supine: Min assist   General bed mobility comments: Min A for trunk elevation and Min A for BLE mgt back to bed    Transfers Overall transfer level: Needs assistance Equipment used: 1 person hand held assist Transfers: Sit to/from Stand Sit to Stand: Min guard           General transfer comment: CGA for standing at EOB, able to stabilize independently with 1 UE assist.      Balance Overall balance assessment: Needs assistance Sitting-balance support: Single extremity supported;Feet supported Sitting balance-Leahy Scale: Good     Standing balance support: Single extremity supported;No upper extremity supported;During functional activity Standing balance-Leahy Scale: Fair Standing balance comment: Good static; Fair dynamic                           ADL either performed or assessed with clinical judgement   ADL  General ADL Comments: Pt required MIN A for LB ADL for threading clothing over L foot 2/2 hx of weakness. CGA for ADL transfers,     Vision         Perception     Praxis      Pertinent Vitals/Pain Pain Assessment: 0-10 Pain Score: 10-Worst pain ever Pain Location: R wrist (visually does not appear in any pain) Pain Descriptors / Indicators:  Aching Pain Intervention(s): Limited activity within patient's tolerance;Monitored during session;Repositioned     Hand Dominance Right   Extremity/Trunk Assessment Upper Extremity Assessment Upper Extremity Assessment: Overall WFL for tasks assessed;RUE deficits/detail RUE Deficits / Details: R wrist brace, pt reports recent R wrist fracture (was in hard cast)   Lower Extremity Assessment Lower Extremity Assessment: Generalized weakness (pt reports L knee hurts after falling on it previously, is able to move against gravity without great difficulty)       Communication Communication Communication: No difficulties   Cognition Arousal/Alertness: Awake/alert Behavior During Therapy: WFL for tasks assessed/performed Overall Cognitive Status: No family/caregiver present to determine baseline cognitive functioning                                 General Comments: Pt demonstrating difficulty remembering her own name, birthday, and her son's name. Pt requiring some cueing/additional time to remember the month. Pt with inconsistencies with memory throughout evaluation. Impaired attention, prbolem solving, reading     General Comments       Exercises Other Exercises Other Exercises: pt instructed in bed mobility to help improve independence while minimzing R wrist involvement for comfort.   Shoulder Instructions      Home Living Family/patient expects to be discharged to:: Private residence Living Arrangements: Children Available Help at Discharge: Available PRN/intermittently;Family Type of Home: House Home Access: Ramped entrance     Home Layout: One level     Bathroom Shower/Tub: Tub/shower unit         Home Equipment: Rollator (4 wheels);Cane - single point;Shower seat   Additional Comments: Pt reports that she doesn't typically use any devices      Prior Functioning/Environment Prior Level of Function : Independent/Modified Independent;History of  Falls (last six months);Patient poor historian/Family not available             Mobility Comments: Pt reports that she mostly ambulates in the home (holding onto son or walls) and limited community distances [facility]) (ie. walks into church and doctors appointments). Drives only short distances ADLs Comments: Reports that son does a lot of the shopping or she has it delievered to her home. Independnet with ADLs. Gets pill packs for meds. Endorses >3 falls/81mo        OT Problem List: Decreased strength;Pain;Decreased safety awareness;Decreased activity tolerance;Impaired balance (sitting and/or standing);Decreased knowledge of use of DME or AE;Decreased cognition      OT Treatment/Interventions: Self-care/ADL training;Therapeutic exercise;Therapeutic activities;Neuromuscular education;Cognitive remediation/compensation;DME and/or AE instruction;Patient/family education;Balance training    OT Goals(Current goals can be found in the care plan section) Acute Rehab OT Goals Patient Stated Goal: get better adn go home OT Goal Formulation: With patient Time For Goal Achievement: 12/20/20 Potential to Achieve Goals: Good ADL Goals Pt Will Perform Lower Body Dressing: with supervision;sit to/from stand;with adaptive equipment Pt Will Transfer to Toilet: with supervision;ambulating;bedside commode (LRAD) Additional ADL Goal #1: Pt will perform morning ADL routine with supervision for safety, AE/DME PRN.  OT Frequency: Min 2X/week  Barriers to D/C: Decreased caregiver support          Co-evaluation              AM-PAC OT "6 Clicks" Daily Activity     Outcome Measure Help from another person eating meals?: None Help from another person taking care of personal grooming?: A Little Help from another person toileting, which includes using toliet, bedpan, or urinal?: A Little Help from another person bathing (including washing, rinsing, drying)?: A Little Help from another person to put on  and taking off regular upper body clothing?: A Little Help from another person to put on and taking off regular lower body clothing?: A Little 6 Click Score: 19   End of Session    Activity Tolerance: Patient tolerated treatment well Patient left: in bed;with call bell/phone within reach  OT Visit Diagnosis: Other abnormalities of gait and mobility (R26.89);Repeated falls (R29.6)                Time: 1950-9326 OT Time Calculation (min): 33 min Charges:  OT General Charges $OT Visit: 1 Visit OT Evaluation $OT Eval Moderate Complexity: 1 Mod OT Treatments $Self Care/Home Management : 8-22 mins  Ardeth Perfect., MPH, MS, OTR/L ascom [phone] 12/06/20, 1:22 PM

## 2020-12-06 NOTE — Consult Note (Signed)
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 head and neck 6. Continue Plavix. May consider adding ASA pending results of CTA.  7. Cardiac telemetry 8. PT/OT/Speech.  9. Continue rosuvastatin. Obtaining a CK level.  10. TSH level (ordered) 11. HgbA1c, fasting lipid panel 12. Frequent neuro checks 13. Hypercoagulable panel.     Electronically signed: Dr. Kerney Elbe 12/06/2020, 8:53 AM

## 2020-12-06 NOTE — Evaluation (Signed)
Physical Therapy Evaluation Patient Details Name: [name] MRN: [id number] DOB: August 03, 1970 Today's Date: 12/06/2020  History of Present Illness  [name] is a 50 y.o. female with medical history significant for coronary artery disease, CVA with R sided deficits, type 2 diabetes mellitus and hypertension, who presented to the emergency room with acute onset of altered mental status with increased slurred speech today and global generalized weakness. Head and neck CTA revealed no emergent large vessel occlusion. MRI showing "Small focus of acute ischemia within the right middle cerebellar peduncle".   Clinical Impression  Pt is a pleasant 49 year old female who presents to the ED for decreased memory/slurred speech/ and weakness. Pt demonstrating difficulty with recalling her own birthday and her son's name but otherwise oriented. Prior to admission, pt reports that she is independent with ADLs and mostly ambulates only household distances and limited community distances without any device usage. Upon evaluation, pt able to perform bed mobility with SPV/CGA, transfers with CGA, and ambulate 25 ft with CGA + 1 UE handheld assistance. Pt demonstrating decreased safety awareness, mild difficulty with alternating foot taps and mild dysmetria with finger to nose testing on the L, and impaired balance. Pt currently limited by decreased caregiver support due to her son working 24 hour shifts and no other family support to provide recommended 24/7 assistance due to impaired cognition. Recommending STR at discharge to improve balance and safety prior to discharging home. Pt will continue to benefit from skilled PT services to improve deficits listed above and overall functional mobility.     Recommendations for follow up therapy are one component of a multi-disciplinary discharge planning process, led by the attending physician.  Recommendations may be updated based on patient status,  additional functional criteria and insurance authorization.  Follow Up Recommendations Skilled nursing-short term rehab (<3 hours/day)    Assistance Recommended at Discharge Frequent or constant Supervision/Assistance  Functional Status Assessment Patient has had a recent decline in their functional status and demonstrates the ability to make significant improvements in function in a reasonable and predictable amount of time.  Equipment Recommendations  None recommended by PT    Recommendations for Other Services       Precautions / Restrictions Precautions Precautions: Fall Restrictions Weight Bearing Restrictions: No      Mobility  Bed Mobility Overal bed mobility: Needs Assistance Bed Mobility: Supine to Sit;Sit to Supine     Supine to sit: Min assist Sit to supine: Min assist   General bed mobility comments: Min A for trunk elevation and Min A for BLE mgt back to bed    Transfers Overall transfer level: Needs assistance Equipment used: 1 person hand held assist Transfers: Sit to/from Stand Sit to Stand: Min guard           General transfer comment: CGA for standing at EOB, able to stabilize independently with 1 UE assist.    Ambulation/Gait Ambulation/Gait assistance: Min guard Gait Distance (Feet): 25 Feet Assistive device: 1 person hand held assist;None Gait Pattern/deviations: Step-through pattern;Decreased stride length;Narrow base of support;Trunk flexed Gait velocity: decreased   General Gait Details: Pt initially stepped with 1 UE assistance and able to take a few steps and turn without UE assistance. Decreased step length bilaterally and decreased overall gait speed  Stairs            Wheelchair Mobility    Modified Rankin (Stroke Patients Only)       Balance Overall balance assessment: Needs assistance  Sitting-balance support: Single extremity supported;Feet supported Sitting balance-Leahy Scale: Good     Standing balance support:  Single extremity supported;No upper extremity supported;During functional activity Standing balance-Leahy Scale: Fair Standing balance comment: Good static; Fair dynamic                             Pertinent Vitals/Pain Pain Assessment: 0-10 Pain Score: 10-Worst pain ever Pain Location: R wrist (visually does not appear in any pain) Pain Descriptors / Indicators: Aching Pain Intervention(s): Limited activity within patient's tolerance;Monitored during session;Repositioned    Home Living Family/patient expects to be discharged to:: Private residence Living Arrangements: Children Available Help at Discharge: Available PRN/intermittently;Family Type of Home: House Home Access: Ramped entrance       Home Layout: One level Home Equipment: Rollator (4 wheels);Cane - single point;Shower seat Additional Comments: Pt reports that she doesn't typically use any devices    Prior Function Prior Level of Function : Independent/Modified Independent;History of Falls (last six months);Patient poor historian/Family not available             Mobility Comments: Pt reports that she mostly ambulates in the home (holding onto son or walls) and limited community distances [facility]) (ie. walks into church and doctors appointments). Drives only short distances ADLs Comments: Reports that son does a lot of the shopping or she has it delievered to her home. Independnet with ADLs. Gets pill packs for meds. Endorses >3 falls/51mo     Hand Dominance   Dominant Hand: Right    Extremity/Trunk Assessment   Upper Extremity Assessment Upper Extremity Assessment: Overall WFL for tasks assessed;RUE deficits/detail RUE Deficits / Details: R wrist brace, pt reports recent R wrist fracture (was in hard cast)    Lower Extremity Assessment Lower Extremity Assessment: Generalized weakness (pt reports L knee hurts after falling on it previously, is able to move against gravity without great difficulty)        Communication   Communication: No difficulties  Cognition Arousal/Alertness: Awake/alert Behavior During Therapy: WFL for tasks assessed/performed Overall Cognitive Status: No family/caregiver present to determine baseline cognitive functioning                                 General Comments: Pt demonstrating difficulty remembering her own name, birthday, and her son's name. Pt requiring some cueing/additional time to remember the month. Pt with inconsistencies with memory throughout evaluation. Impaired attention, prbolem solving, reading        General Comments      Exercises Other Exercises Other Exercises: pt instructed in bed mobility to help improve independence while minimzing R wrist involvement for comfort.   Assessment/Plan    PT Assessment Patient needs continued PT services  PT Problem List Decreased strength;Decreased activity tolerance;Decreased balance;Decreased cognition;Decreased coordination       PT Treatment Interventions DME instruction;Gait training;Stair training;Functional mobility training;Therapeutic activities;Therapeutic exercise;Balance training;Neuromuscular re-education;Cognitive remediation;Manual techniques;Modalities;Patient/family education    PT Goals (Current goals can be found in the Care Plan section)  Acute Rehab PT Goals Patient Stated Goal: to understand why her memory is decreased PT Goal Formulation: With patient Time For Goal Achievement: 12/20/20 Potential to Achieve Goals: Fair    Frequency Min 2X/week   Barriers to discharge Decreased caregiver support Pt reports that her son works 24 hour shifts and has no other family support who can provide supervision/assistance    Co-evaluation  AM-PAC PT "6 Clicks" Mobility  Outcome Measure Help needed turning from your back to your side while in a flat bed without using bedrails?: A Little Help needed moving from lying on your back to sitting  on the side of a flat bed without using bedrails?: A Little Help needed moving to and from a bed to a chair (including a wheelchair)?: A Little Help needed standing up from a chair using your arms (e.g., wheelchair or bedside chair)?: A Little Help needed to walk in hospital room?: A Little Help needed climbing 3-5 steps with a railing? : A Lot 6 Click Score: 17    End of Session Equipment Utilized During Treatment: Gait belt Activity Tolerance: Patient tolerated treatment well Patient left: in bed;with call bell/phone within reach Nurse Communication: Mobility status PT Visit Diagnosis: Unsteadiness on feet (R26.81);History of falling (Z91.81)    Time: 1040-1110 PT Time Calculation (min) (ACUTE ONLY): 30 min   Charges:   PT Evaluation $PT Eval Moderate Complexity: [address] SPT   Andrey Campanile 12/06/2020, 1:11 PM

## 2020-12-06 NOTE — ED Notes (Signed)
Pt provided graham crackers and peanut butter and sprite.

## 2020-12-06 NOTE — ED Notes (Signed)
CBG 156 

## 2020-12-06 NOTE — Progress Notes (Addendum)
PROGRESS NOTE    [name]  ZOX:[id number] DOB: [birth year] DOA: 12/05/2020 PCP: [name], MD   Brief Narrative:  This 50 years old female with PMH significant for CAD, type 2 diabetes, hypertension presented to the ED with acute onset of altered mentation with increased slurred speech and global generalized weakness.  Patient denies any muscle weakness or paresthesia.  She also reports having increased urinary frequency associated with urgency and dysuria with lower abdominal pain.  Patient also reports having headache and blurring of vision.  She was hypertensive in the ED other vitals were stable.  CT head no acute abnormality.  Head CT and neck shows no emergent large vessel occlusion.  Very consistent with UTI started on IV ceftriaxone.  MRI brain: Small focus of acute ischemia within the right middle cerebellar peduncle. No hemorrhage or mass effect.  Neurology consulted.  Awaiting recommendation.  Assessment & Plan:   Active Problems:   Altered mental status  Acute metabolic encephalopathy could be multifactorial. Patient presented with altered mentation with increased slurring of his speech. CT head no acute abnormality.  But MRI shows small focus of acute ischemia within the right middle cerebellar peduncle. UA consistent with UTI,  started on ceftriaxone, follow-up urine cultures Mental status has improved.  Back to baseline. Neurology consulted , awaiting recommendation.  UTI: Continue ceftriaxone, follow-up urine cultures.  Slurred speech rule out TIA Josepha Pigg CVA: MRI shows small focus of acute ischemia within the right middle cerebellar peduncle. Follow-up neurology recommendation. Continue neurochecks every 4 hours. PT and OT eval. Continue aspirin , plavix and statins.  Hypertensive urgency: Allow permissive parameters while ruling out acute CVA. Resume BP meds as reasonable.  Type 2 diabetes: Continue regular insulin sliding scale.   Continue  gabapentin  Dyslipidemia: Continue statins  Hypothyroidism: Continue levothyroxine  CAD: Continue Imdur aspirin and Coreg.  DVT prophylaxis: SCDs Code Status: Full code Family Communication: No family at bedside) Disposition Plan:   Status is: Inpatient  Remains inpatient appropriate because: Admitted for altered mentation could be secondary to UTI and CVA work-up.  Anticipated discharge home in few days.  Consultants:  Neurology  Procedures: CT head, CT head and neck, MRI Antimicrobials: Ceftriaxone. Subjective: Patient was seen and examined at bedside.  Overnight events noted.  She still reports having burning urination.   Patient reports she is doing much better speech is at her baseline.  Objective: Vitals:   12/06/20 0741 12/06/20 0830 12/06/20 1130 12/06/20 1300  BP: (!) 186/69 (!) 160/79 (!) 148/81 111/75  Pulse: 65 (!) 58 73 75  Resp: 15 12 16 18   Temp:      TempSrc:      SpO2: 100% 99% 92% 92%  Weight:      Height:        Intake/Output Summary (Last 24 hours) at 12/06/2020 1443 Last data filed at 12/06/2020 0744 Gross per 24 hour  Intake --  Output 500 ml  Net -500 ml   Filed Weights   12/05/20 1837  Weight: 72.5 kg    Examination:  General exam: Appears comfortable, not in any acute distress.   Respiratory system: Clear to auscultation. Respiratory effort normal.  RR 15 Cardiovascular system: S1-S2 heard, regular rate and rhythm, no murmur. Gastrointestinal system: Abdomen soft, nontender, nondistended, BS +. Central nervous system: Alert and oriented x 3. No focal neurological deficits. Extremities: No edema, no cyanosis, no clubbing. Skin: No rashes, lesions or ulcers Psychiatry: Judgement and insight appear normal. Mood &  affect appropriate.     Data Reviewed: I have personally reviewed following labs and imaging studies  CBC: Recent Labs  Lab 12/05/20 1844 12/06/20 0536  WBC 10.4 8.5  NEUTROABS 8.9*  --   HGB 13.4 11.9*  HCT  41.9 37.0  MCV 81.0 80.8  PLT 186 162   Basic Metabolic Panel: Recent Labs  Lab 12/05/20 1844 12/06/20 0536  NA 140 141  K 4.5 4.3  CL 106 105  CO2 24 26  GLUCOSE 146* 105*  BUN 25* 23*  CREATININE 1.68* 1.51*  CALCIUM 9.4 9.0   GFR: Estimated Creatinine Clearance: 40.7 mL/min (A) (by C-G formula based on SCr of 1.51 mg/dL (H)). Liver Function Tests: Recent Labs  Lab 12/05/20 1844  AST 19  ALT 13  ALKPHOS 138*  BILITOT 0.9  PROT 7.6  ALBUMIN 3.9   Recent Labs  Lab 12/05/20 1844  LIPASE 28   No results for input(s): AMMONIA in the last 168 hours. Coagulation Profile: Recent Labs  Lab 12/05/20 1844  INR 1.1   Cardiac Enzymes: No results for input(s): CKTOTAL, CKMB, CKMBINDEX, TROPONINI in the last 168 hours. BNP (last 3 results) No results for input(s): PROBNP in the last 8760 hours. HbA1C: Recent Labs    12/06/20 0536  HGBA1C 5.3   CBG: Recent Labs  Lab 12/06/20 0105 12/06/20 0739 12/06/20 1150  GLUCAP 126* 105* 192*   Lipid Profile: Recent Labs    12/06/20 0536  CHOL 179  HDL 28*  LDLCALC 124*  TRIG 137  CHOLHDL 6.4   Thyroid Function Tests: No results for input(s): TSH, T4TOTAL, FREET4, T3FREE, THYROIDAB in the last 72 hours. Anemia Panel: No results for input(s): VITAMINB12, FOLATE, FERRITIN, TIBC, IRON, RETICCTPCT in the last 72 hours. Sepsis Labs: No results for input(s): PROCALCITON, LATICACIDVEN in the last 168 hours.  Recent Results (from the past 240 hour(s))  Resp Panel by RT-PCR (Flu A&B, Covid) Nasopharyngeal Swab     Status: None   Collection Time: 12/05/20  8:26 PM   Specimen: Nasopharyngeal Swab; Nasopharyngeal(NP) swabs in vial transport medium  Result Value Ref Range Status   SARS Coronavirus 2 by RT PCR NEGATIVE NEGATIVE Final    Comment: (NOTE) SARS-CoV-2 target nucleic acids are NOT DETECTED.  The SARS-CoV-2 RNA is generally detectable in upper respiratory specimens during the acute phase of infection. The  lowest concentration of SARS-CoV-2 viral copies this assay can detect is 138 copies/mL. A negative result does not preclude SARS-Cov-2 infection and should not be used as the sole basis for treatment or other patient management decisions. A negative result may occur with  improper specimen collection/handling, submission of specimen other than nasopharyngeal swab, presence of viral mutation(s) within the areas targeted by this assay, and inadequate number of viral copies(<138 copies/mL). A negative result must be combined with clinical observations, patient history, and epidemiological information. The expected result is Negative.  Fact Sheet for Patients:  BloggerCourse.com  Fact Sheet for Healthcare Providers:  SeriousBroker.it  This test is no t yet approved or cleared by the [place] FDA and  has been authorized for detection and/or diagnosis of SARS-CoV-2 by FDA under an Emergency Use Authorization (EUA). This EUA will remain  in effect (meaning this test can be used) for the duration of the COVID-19 declaration under Section 564(b)(1) of the Act, 21 U.S.C.section 360bbb-3(b)(1), unless the authorization is terminated  or revoked sooner.       Influenza A by PCR NEGATIVE NEGATIVE Final   Influenza B  by PCR NEGATIVE NEGATIVE Final    Comment: (NOTE) The Xpert Xpress SARS-CoV-2/FLU/RSV plus assay is intended as an aid in the diagnosis of influenza from Nasopharyngeal swab specimens and should not be used as a sole basis for treatment. Nasal washings and aspirates are unacceptable for Xpert Xpress SARS-CoV-2/FLU/RSV testing.  Fact Sheet for Patients: BloggerCourse.com  Fact Sheet for Healthcare Providers: SeriousBroker.it  This test is not yet approved or cleared by the [place] FDA and has been authorized for detection and/or diagnosis of SARS-CoV-2 by FDA under  an Emergency Use Authorization (EUA). This EUA will remain in effect (meaning this test can be used) for the duration of the COVID-19 declaration under Section 564(b)(1) of the Act, 21 U.S.C. section 360bbb-3(b)(1), unless the authorization is terminated or revoked.  Performed at [name], [address] [place], [place] [address]     Radiology Studies: CT ANGIO HEAD NECK W WO CM  Result Date: 12/05/2020 CLINICAL DATA:  Confusion EXAM: CT ANGIOGRAPHY HEAD AND NECK TECHNIQUE: Multidetector CT imaging of the head and neck was performed using the standard protocol during bolus administration of intravenous contrast. Multiplanar CT image reconstructions and MIPs were obtained to evaluate the vascular anatomy. Carotid stenosis measurements (when applicable) are obtained utilizing NASCET criteria, using the distal internal carotid diameter as the denominator. CONTRAST:  60mL OMNIPAQUE IOHEXOL 350 MG/ML SOLN COMPARISON:  None. FINDINGS: CTA NECK FINDINGS SKELETON: There is no bony spinal canal stenosis. No lytic or blastic lesion. OTHER NECK: Normal pharynx, larynx and major salivary glands. No cervical lymphadenopathy. Unremarkable thyroid gland. UPPER CHEST: No pneumothorax or pleural effusion. No nodules or masses. AORTIC ARCH: Right-sided aortic arch. RIGHT CAROTID SYSTEM: No dissection, occlusion or aneurysm. Mild atherosclerotic calcification at the carotid bifurcation without hemodynamically significant stenosis. LEFT CAROTID SYSTEM: Normal without aneurysm, dissection or stenosis. VERTEBRAL ARTERIES: Right dominant configuration. Both origins are clearly patent. Diffusely diminutive left vertebral artery. CTA HEAD FINDINGS POSTERIOR CIRCULATION: --Vertebral arteries: Atherosclerotic calcification of the right V4 segment with moderate narrowing. --Inferior cerebellar arteries: Normal. --Basilar artery: Normal. --Superior cerebellar arteries: Normal. --Posterior cerebral arteries (PCA):  Normal. ANTERIOR CIRCULATION: --Intracranial internal carotid arteries: Atherosclerotic calcification of the internal carotid arteries at the skull base without hemodynamically significant stenosis. --Anterior cerebral arteries (ACA): Normal. Both A1 segments are present. Patent anterior communicating artery (a-comm). --Middle cerebral arteries (MCA): Normal. VENOUS SINUSES: As permitted by contrast timing, patent. ANATOMIC VARIANTS: None Review of the MIP images confirms the above findings. IMPRESSION: 1. No emergent large vessel occlusion. 2. Moderate narrowing of the right vertebral artery V4 segment secondary to atherosclerotic calcification. 3. Right-sided aortic arch. Electronically Signed   By: Deatra Robinson M.D.   On: 12/05/2020 22:01   CT HEAD WO CONTRAST  Result Date: 12/05/2020 CLINICAL DATA:  Altered mental status. EXAM: CT HEAD WITHOUT CONTRAST TECHNIQUE: Contiguous axial images were obtained from the base of the skull through the vertex without intravenous contrast. COMPARISON:  August 07, 2020 FINDINGS: Brain: There is mild cerebral atrophy with widening of the extra-axial spaces and ventricular dilatation. There are areas of decreased attenuation within the white matter tracts of the supratentorial brain, consistent with microvascular disease changes. Small chronic bilateral basal ganglia lacunar infarcts are seen. Vascular: No hyperdense vessel or unexpected calcification. Skull: Normal. Negative for fracture or focal lesion. Sinuses/Orbits: No acute finding. Other: None. IMPRESSION: 1. Generalized cerebral atrophy. 2. Small chronic bilateral basal ganglia lacunar infarcts. 3. No acute intracranial abnormality. Electronically Signed   By: Demetrius Revel.D.  On: 12/05/2020 19:24   CT Cervical Spine Wo Contrast  Result Date: 12/05/2020 CLINICAL DATA:  Neck trauma, dangerous injury mechanism (Age 42-64y) EXAM: CT CERVICAL SPINE WITHOUT CONTRAST TECHNIQUE: Multidetector CT imaging of the  cervical spine was performed without intravenous contrast. Multiplanar CT image reconstructions were also generated. COMPARISON:  10/12/2018 FINDINGS: Alignment: Normal Skull base and vertebrae: No acute fracture. No primary bone lesion or focal pathologic process. Soft tissues and spinal canal: No prevertebral fluid or swelling. No visible canal hematoma. Disc levels: Large flowing anterior osteophytes. Disc spaces maintained. Upper chest: No acute findings Other: None IMPRESSION: Degenerative spurring anteriorly. No acute bony abnormality. Electronically Signed   By: Charlett Nose M.D.   On: 12/05/2020 21:33   MR BRAIN WO CONTRAST  Result Date: 12/05/2020 CLINICAL DATA:  Encephalopathy and weakness EXAM: MRI HEAD WITHOUT CONTRAST TECHNIQUE: Multiplanar, multiecho pulse sequences of the brain and surrounding structures were obtained without intravenous contrast. COMPARISON:  08/07/2020 FINDINGS: Brain: Small focus of abnormal diffusion restriction within the right middle cerebellar peduncle. No other diffusion abnormality. There are foci of chronic microhemorrhage within the brainstem, left frontal lobe and right temporal lobe. Hyperintense T2-weighted signal is moderately widespread throughout the white matter. Generalized volume loss without a clear lobar predilection. The midline structures are normal. Vascular: Major flow voids are preserved. Skull and upper cervical spine: Normal calvarium and skull base. Visualized upper cervical spine and soft tissues are normal. Sinuses/Orbits:No paranasal sinus fluid levels or advanced mucosal thickening. No mastoid or middle ear effusion. Normal orbits. IMPRESSION: 1. Small focus of acute ischemia within the right middle cerebellar peduncle. No hemorrhage or mass effect. 2. Moderate chronic small vessel ischemic disease and Cerebral Atrophy (ICD10-G31.9). Electronically Signed   By: Deatra Robinson M.D.   On: 12/05/2020 23:26   CT ABDOMEN PELVIS W CONTRAST  Result  Date: 12/05/2020 CLINICAL DATA:  Abdominal distension EXAM: CT ABDOMEN AND PELVIS WITH CONTRAST TECHNIQUE: Multidetector CT imaging of the abdomen and pelvis was performed using the standard protocol following bolus administration of intravenous contrast. CONTRAST:  60mL OMNIPAQUE IOHEXOL 350 MG/ML SOLN COMPARISON:  None. FINDINGS: Lower chest: No acute abnormality.  Coronary artery calcifications. Hepatobiliary: No focal liver abnormality is seen. Status post cholecystectomy. No biliary dilatation. Pancreas: No focal abnormality or ductal dilatation. Spleen: No focal abnormality.  Normal size. Adrenals/Urinary Tract: Scarring and cortical thinning in the kidneys bilaterally. No hydronephrosis. No renal or adrenal mass. Urinary bladder unremarkable. 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 Hyperdense area noted in the proximal duodenum in the area of prior hemorrhage measuring 1.3 cm. Cannot exclude duodenal lesion. No bowel obstruction. Vascular/Lymphatic: No evidence of aneurysm or adenopathy. Reproductive: Uterus and adnexa unremarkable.  No mass. Other: No free fluid or free air. Musculoskeletal: No acute bony abnormality. IMPRESSION: 1.3 cm hyperdense focus seen within the proximal duodenum in the area of prior hemorrhage. Cannot exclude duodenal wall lesion/mass. Consider GI consultation. No acute findings in the abdomen or pelvis. Electronically Signed   By: Charlett Nose M.D.   On: 12/05/2020 21:37    Scheduled Meds:   stroke: mapping our early stages of recovery book   Does not apply Once   ALPRAZolam  1 mg Oral BID   amLODipine  2.5 mg Oral Daily   aspirin EC  81 mg Oral Daily   buPROPion ER  100 mg Oral BID AC   carvedilol  12.5 mg Oral BID   clopidogrel  75 mg Oral Daily   desvenlafaxine  50 mg Oral q morning   diclofenac Sodium  2 g Topical QID   enoxaparin (LOVENOX) injection  40 mg Subcutaneous Q24H   estradiol  1 Applicatorful Vaginal Once per day on Mon  Wed Fri   gabapentin  600 mg Oral BID   insulin aspart  0-9 Units Subcutaneous TID AC & HS   isosorbide mononitrate  120 mg Oral Daily   levothyroxine  25 mcg Oral Q0600   memantine  5 mg Oral BID   rosuvastatin  40 mg Oral q1800   traZODone  100-200 mg Oral QHS   Continuous Infusions:  sodium chloride 100 mL/hr at 12/06/20 0856   cefTRIAXone (ROCEPHIN)  IV       LOS: 0 days    Time spent: 35 mins    Cordarrius Coad, MD [facility]   If 7PM-7AM, please contact night-coverage

## 2020-12-07 ENCOUNTER — Encounter: Payer: Self-pay | Admitting: Family Medicine

## 2020-12-07 LAB — BASIC METABOLIC PANEL
Anion gap: 7 (ref 5–15)
BUN: 29 mg/dL — ABNORMAL HIGH (ref 6–20)
CO2: 26 mmol/L (ref 22–32)
Calcium: 8.5 mg/dL — ABNORMAL LOW (ref 8.9–10.3)
Chloride: 104 mmol/L (ref 98–111)
Creatinine, Ser: 1.89 mg/dL — ABNORMAL HIGH (ref 0.44–1.00)
GFR, Estimated: 32 mL/min — ABNORMAL LOW (ref >60)
Glucose, Bld: 90 mg/dL (ref 70–99)
Potassium: 4.5 mmol/L (ref 3.5–5.1)
Sodium: 137 mmol/L (ref 135–145)

## 2020-12-07 LAB — URINE CULTURE: Culture: 90000 — AB

## 2020-12-07 LAB — MAGNESIUM: Magnesium: 1.8 mg/dL (ref 1.7–2.4)

## 2020-12-07 LAB — CBC
HCT: 33.8 % — ABNORMAL LOW (ref 36.0–46.0)
Hemoglobin: 10.4 g/dL — ABNORMAL LOW (ref 12.0–15.0)
MCH: 25.2 pg — ABNORMAL LOW (ref 26.0–34.0)
MCHC: 30.8 g/dL (ref 30.0–36.0)
MCV: 81.8 fL (ref 80.0–100.0)
Platelets: 137 10*3/uL — ABNORMAL LOW (ref 150–400)
RBC: 4.13 MIL/uL (ref 3.87–5.11)
RDW: 14.1 % (ref 11.5–15.5)
WBC: 7.8 10*3/uL (ref 4.0–10.5)
nRBC: 0 % (ref 0.0–0.2)

## 2020-12-07 LAB — GLUCOSE, CAPILLARY
Glucose-Capillary: 90 mg/dL (ref 70–99)
Glucose-Capillary: 122 mg/dL — ABNORMAL HIGH (ref 70–99)
Glucose-Capillary: 78 mg/dL (ref 70–99)
Glucose-Capillary: 84 mg/dL (ref 70–99)

## 2020-12-07 LAB — CK: Total CK: 52 U/L (ref 38–234)

## 2020-12-07 LAB — TSH: TSH: 1.489 u[IU]/mL (ref 0.350–4.500)

## 2020-12-07 LAB — PHOSPHORUS: Phosphorus: 3.7 mg/dL (ref 2.5–4.6)

## 2020-12-07 NOTE — Progress Notes (Signed)
   12/07/20 1000  Assess: MEWS Score  Temp 97.6 F (36.4 C)  BP (!) 202/79  Pulse Rate 71  Resp 18  SpO2 100 %  O2 Device Room Air  Assess: MEWS Score  MEWS Temp 0  MEWS Systolic 2  MEWS Pulse 0  MEWS RR 0  MEWS LOC 0  MEWS Score 2  MEWS Score Color Yellow  Assess: if the MEWS score is Yellow or Red  Were vital signs taken at a resting state? No  Focused Assessment No change from prior assessment  Does the patient meet 2 or more of the SIRS criteria? No  MEWS guidelines implemented *See Row Information* No, vital signs rechecked  Treat  MEWS Interventions Administered scheduled meds/treatments  Pain Scale 0-10  Pain Score 0  Escalate  MEWS: Escalate Yellow: discuss with charge nurse/RN and consider discussing with provider and RRT  Notify: Charge Nurse/RN  Name of Charge Nurse/RN Notified Malka RN  Date Charge Nurse/RN Notified 12/07/20  Time Charge Nurse/RN Notified 1100  Document  Patient Outcome Stabilized after interventions  Progress note created (see row info) Yes  Assess: SIRS CRITERIA  SIRS Temperature  0  SIRS Pulse 0  SIRS Respirations  0  SIRS WBC 0  SIRS Score Sum  0

## 2020-12-07 NOTE — Progress Notes (Signed)
Order Requisition for Advanced Directive. Provided the education for patient. Informed her it was difficult to complete one over the weekend. Advised patient to complete forms and have staff to contact the chaplain on Monday morning to facilitate the notary and witnesses. Patient also shared with me thoughts of self harm. I relayed this to charge nurse and patient care nurse for the day. Attending physician was a part of the conversation and will place orders for consults that will be a part of the goals of care.

## 2020-12-07 NOTE — Progress Notes (Addendum)
PROGRESS NOTE  [name] LEX:[id number] DOB: 1970-09-11 DOA: 12/05/2020 PCP: [name], MD  HPI/Recap of past 24 hours:  brief Narrative:  This is a 50 year old female with past medical history significant for coronary disease, type 2 diabetes mellitus, hypertension, who presented to the emergency department with acute onset of altered mental status with increased slurred speech and global generalized weakness.  Patient denies any muscle weakness or paresthesia patient also reported having increased urinary frequency associated with dysuria and lower abdominal pain.  Patient dates that she has had recurrent urinary tract infection.  Patient also reported as having headache and blurry vision.  She was hypertensive in the ED other vital signs were stable CT scan did not show any acute abnormality.  Her head CT and neck CT showed no emergent large vessel occlusion however MRI did show small focus of acute ischemia within the right middle cerebral peduncle no hemorrhage or mass-effect Neurology was therefore consulted and is following patient Her UA showed abnormality with urinary tract infection and she was started on IV ceftriaxone   Subjective: December 07, 2020:  Patient seen and examined at bedside she looked very pleasant and was happy that she was making progress and she stated that she has started to remember things now but she is still a little slow also she did intimated that she did not even remember the name of her son. [facility]/chaplain informed me that patient had had some suicidal thoughts but she did not have the intent of current amount and had complained that the doctor on the outpatient were not listening to her. I will order obtain psych consult.  I do not feel she is a threat to herself or to others at this time.     Assessment/Plan: Active Problems:   Altered mental status Acute metabolic encephalopathy could be multifactorial. Patient presented with  altered mental status and slurring of her speech CT head was done and it was normal but MRI showed small focus of acute ischemia within the mid right middle cerebral parenchymal Neurology was consulted and he also ordered a bubble study we are waiting for the bubble study    UTI: Her UA is consistent with UTI she was started on ceftriaxone Follow-up culture showed E. coli. Patient will complete ceftriaxone and we will switch her to oral antibiotics probably be able to go home tomorrow   Slurred speech rule out TIA Mariea Stable CVA: MRI shows some small focus of acute ischemia within the right middle cerebellar peduncle Neurology is following Continue physical therapy and Occupational Therapy evaluation Continue neurochecks Bubble study was ordered.     Hypertensive urgency: Patient initially came in with hypertensive urgency and due to stroke rule out permissive blood pressure was allowed Will resume regular blood pressure medicine as needed or reasonable    Type 2 diabetes: Continue regular insulin sliding scale  Dyslipidemia: Continue statins  Hypothyroidism: Continue levothyroxine   CAD:  continue Imdur aspirin and Coreg.  Depression.  Patient is already on medication.  She is requesting to speak with somebody.  She says she had a mental health provider but they were never answer her call I will want to speak. I have consulted psychiatry  Code Status:   Severity of Illness: The appropriate patient status for this patient is INPATIENT. Inpatient status is judged to be reasonable and necessary in order to provide the required intensity of service to ensure the patient's safety. The patient's presenting symptoms, physical exam findings, and initial  radiographic and laboratory data in the context of their chronic comorbidities is felt to place them at high risk for further clinical deterioration. Furthermore, it is not anticipated that the patient will be medically stable for  discharge from the hospital within 2 midnights of admission.   * I certify that at the point of admission it is my clinical judgment that the patient will require inpatient hospital care spanning beyond 2 midnights from the point of admission due to high intensity of service, high risk for further deterioration and high frequency of surveillance required.*   Family Communication:   Disposition Plan:   Status is: Inpatient   Dispo: The patient is from: Home              Anticipated d/c is to:               Anticipated d/c date is:               Patient currently not medically stable for discharge  Consultants: Neurology  Procedures: None  Antimicrobials: Ceftriaxone  DVT prophylaxis:   Lovenox  Objective: Vitals:   12/06/20 2030 12/07/20 0205 12/07/20 0548 12/07/20 0756  BP: (!) 151/87 (!) 169/81 (!) 159/80 (!) 177/83  Pulse: 86 (!) 57 (!) 49 (!) 54  Resp: 16 16 16 18   Temp:  98 F (36.7 C) 97.8 F (36.6 C) 98.1 F (36.7 C)  TempSrc:  Oral Oral Oral  SpO2: 93% 100% 97% 98%  Weight:      Height:        Intake/Output Summary (Last 24 hours) at 12/07/2020 0855 Last data filed at 12/07/2020 0547 Gross per 24 hour  Intake 900.12 ml  Output 250 ml  Net 650.12 ml   Filed Weights   12/05/20 1837  Weight: 72.5 kg   Body mass index is 29.23 kg/m.  Exam:  General: 50 y.o. year-old female well developed well nourished in no acute distress.  Alert and oriented x3. Cardiovascular: Regular rate and rhythm with no rubs or gallops.  No thyromegaly or JVD noted.   Respiratory: Clear to auscultation with no wheezes or rales. Good inspiratory effort. Abdomen: Soft nontender nondistended with normal bowel sounds x4 quadrants. Musculoskeletal: No lower extremity edema. 2/4 pulses in all 4 extremities. Skin: No ulcerative lesions noted or rashes, Psychiatry: Mood is appropriate for condition and setting Neurology:    Data Reviewed: CBC: Recent Labs  Lab 12/05/20 1844  12/06/20 0536 12/07/20 0252  WBC 10.4 8.5 7.8  NEUTROABS 8.9*  --   --   HGB 13.4 11.9* 10.4*  HCT 41.9 37.0 33.8*  MCV 81.0 80.8 81.8  PLT 186 162 130*   Basic Metabolic Panel: Recent Labs  Lab 12/05/20 1844 12/06/20 0536 12/07/20 0252  NA 140 141 137  K 4.5 4.3 4.5  CL 106 105 104  CO2 24 26 26   GLUCOSE 146* 105* 90  BUN 25* 23* 29*  CREATININE 1.68* 1.51* 1.89*  CALCIUM 9.4 9.0 8.5*  MG  --   --  1.8  PHOS  --   --  3.7   GFR: Estimated Creatinine Clearance: 32.5 mL/min (A) (by C-G formula based on SCr of 1.89 mg/dL (H)). Liver Function Tests: Recent Labs  Lab 12/05/20 1844  AST 19  ALT 13  ALKPHOS 138*  BILITOT 0.9  PROT 7.6  ALBUMIN 3.9   Recent Labs  Lab 12/05/20 1844  LIPASE 28   No results for input(s): AMMONIA in the last 168 hours. Coagulation  Profile: Recent Labs  Lab 12/05/20 1844  INR 1.1   Cardiac Enzymes: Recent Labs  Lab 12/07/20 0252  CKTOTAL 52   BNP (last 3 results) No results for input(s): PROBNP in the last 8760 hours. HbA1C: Recent Labs    12/06/20 0536  HGBA1C 5.3   CBG: Recent Labs  Lab 12/06/20 0739 12/06/20 1150 12/06/20 1610 12/06/20 2128 12/07/20 0758  GLUCAP 105* 192* 169* 89 90   Lipid Profile: Recent Labs    12/06/20 0536  CHOL 179  HDL 28*  LDLCALC 124*  TRIG 137  CHOLHDL 6.4   Thyroid Function Tests: Recent Labs    12/07/20 0252  TSH 1.489   Anemia Panel: No results for input(s): VITAMINB12, FOLATE, FERRITIN, TIBC, IRON, RETICCTPCT in the last 72 hours. Urine analysis:    Component Value Date/Time   COLORURINE YELLOW (A) 12/05/2020 2142   APPEARANCEUR CLOUDY (A) 12/05/2020 2142   APPEARANCEUR Cloudy (A) 11/04/2020 1015   LABSPEC 1.016 12/05/2020 2142   LABSPEC 1.012 12/29/2013 1210   PHURINE 5.0 12/05/2020 2142   GLUCOSEU NEGATIVE 12/05/2020 2142   GLUCOSEU >=500 12/29/2013 1210   HGBUR LARGE (A) 12/05/2020 2142   HGBUR negative 04/14/2010 1435   BILIRUBINUR NEGATIVE 12/05/2020  2142   BILIRUBINUR Negative 11/04/2020 1015   BILIRUBINUR Negative 12/29/2013 1210   KETONESUR NEGATIVE 12/05/2020 2142   PROTEINUR 100 (A) 12/05/2020 2142   UROBILINOGEN 0.2 04/18/2020 1326   UROBILINOGEN negative 04/14/2010 1435   NITRITE NEGATIVE 12/05/2020 2142   LEUKOCYTESUR LARGE (A) 12/05/2020 2142   LEUKOCYTESUR Negative 12/29/2013 1210   Sepsis Labs: @LABRCNTIP (procalcitonin:4,lacticidven:4)  ) Recent Results (from the past 240 hour(s))  Resp Panel by RT-PCR (Flu A&B, Covid) Nasopharyngeal Swab     Status: None   Collection Time: 12/05/20  8:26 PM   Specimen: Nasopharyngeal Swab; Nasopharyngeal(NP) swabs in vial transport medium  Result Value Ref Range Status   SARS Coronavirus 2 by RT PCR NEGATIVE NEGATIVE Final    Comment: (NOTE) SARS-CoV-2 target nucleic acids are NOT DETECTED.  The SARS-CoV-2 RNA is generally detectable in upper respiratory specimens during the acute phase of infection. The lowest concentration of SARS-CoV-2 viral copies this assay can detect is 138 copies/mL. A negative result does not preclude SARS-Cov-2 infection and should not be used as the sole basis for treatment or other patient management decisions. A negative result may occur with  improper specimen collection/handling, submission of specimen other than nasopharyngeal swab, presence of viral mutation(s) within the areas targeted by this assay, and inadequate number of viral copies(<138 copies/mL). A negative result must be combined with clinical observations, patient history, and epidemiological information. The expected result is Negative.  Fact Sheet for Patients:  EntrepreneurPulse.com.au  Fact Sheet for Healthcare Providers:  IncredibleEmployment.be  This test is no t yet approved or cleared by the [place] FDA and  has been authorized for detection and/or diagnosis of SARS-CoV-2 by FDA under an Emergency Use Authorization (EUA). This EUA  will remain  in effect (meaning this test can be used) for the duration of the COVID-19 declaration under Section 564(b)(1) of the Act, 21 U.S.C.section 360bbb-3(b)(1), unless the authorization is terminated  or revoked sooner.       Influenza A by PCR NEGATIVE NEGATIVE Final   Influenza B by PCR NEGATIVE NEGATIVE Final    Comment: (NOTE) The Xpert Xpress SARS-CoV-2/FLU/RSV plus assay is intended as an aid in the diagnosis of influenza from Nasopharyngeal swab specimens and should not be used as a sole basis  for treatment. Nasal washings and aspirates are unacceptable for Xpert Xpress SARS-CoV-2/FLU/RSV testing.  Fact Sheet for Patients: EntrepreneurPulse.com.au  Fact Sheet for Healthcare Providers: IncredibleEmployment.be  This test is not yet approved or cleared by the [place] FDA and has been authorized for detection and/or diagnosis of SARS-CoV-2 by FDA under an Emergency Use Authorization (EUA). This EUA will remain in effect (meaning this test can be used) for the duration of the COVID-19 declaration under Section 564(b)(1) of the Act, 21 U.S.C. section 360bbb-3(b)(1), unless the authorization is terminated or revoked.  Performed at [facility], [address] [place], [place] [address]   Urine Culture     Status: Abnormal (Preliminary result)   Collection Time: 12/05/20  9:42 PM   Specimen: Urine, Clean Catch  Result Value Ref Range Status   Specimen Description   Final    URINE, CLEAN CATCH Performed at [facility], [address] [place], [place] [address]    Special Requests   Final    NONE Performed at [facility], [place], [place] [address]    Culture (A)  Final    90,000 COLONIES/mL Lonell Grandchild NEGATIVE RODS IDENTIFICATION AND SUSCEPTIBILITIES TO FOLLOW Performed at [facility], [place] [address], [place], [place] [address]    Report Status PENDING  Incomplete       Studies: No results found.  Scheduled Meds:   stroke: mapping our early stages of recovery book   Does not apply Once   ALPRAZolam  1 mg Oral BID   amLODipine  2.5 mg Oral Daily   aspirin EC  81 mg Oral Daily   buPROPion ER  100 mg Oral BID AC   carvedilol  12.5 mg Oral BID   clopidogrel  75 mg Oral Daily   desvenlafaxine  50 mg Oral q morning   diclofenac Sodium  2 g Topical QID   enoxaparin (LOVENOX) injection  40 mg Subcutaneous Q24H   estradiol  1 Applicatorful Vaginal Once per day on Mon Wed Fri   gabapentin  600 mg Oral BID   insulin aspart  0-9 Units Subcutaneous TID AC & HS   isosorbide mononitrate  120 mg Oral Daily   levothyroxine  25 mcg Oral Q0600   memantine  5 mg Oral BID   rosuvastatin  40 mg Oral q1800   traZODone  100-200 mg Oral QHS    Continuous Infusions:  sodium chloride 100 mL/hr at 12/07/20 0254   cefTRIAXone (ROCEPHIN)  IV Stopped (12/07/20 0500)     LOS: 1 day     Cristal Deer, MD [facility]  To reach me or the doctor on call, go to: [email or web address] Password [facility]  12/07/2020, 8:55 AM

## 2020-12-07 NOTE — Progress Notes (Signed)
CTA of head and neck:  1. No emergent large vessel occlusion. 2. Moderate narrowing of the right vertebral artery V4 segment secondary to atherosclerotic calcification. 3. Right-sided aortic arch.  TSH is normal.   TTE pending.   Electronically signed: Dr. Kerney Elbe

## 2020-12-07 NOTE — Progress Notes (Signed)
Skin swarm completed with [name]. Abrasion noted to left knee, cleansed and foam dressing applied. No other skin issues identified. Oriented to room and call light system. Call bell within reach and able to use.

## 2020-12-08 ENCOUNTER — Inpatient Hospital Stay (HOSPITAL_COMMUNITY)
Admit: 2020-12-08 | Discharge: 2020-12-08 | Disposition: A | Discharge status: Home / Self Care | Payer: Medicare Other | Attending: Neurology | Admitting: Neurology

## 2020-12-08 ENCOUNTER — Inpatient Hospital Stay: Payer: Medicare Other

## 2020-12-08 DIAGNOSIS — I6389 Other cerebral infarction: Secondary | ICD-10-CM

## 2020-12-08 DIAGNOSIS — F331 Major depressive disorder, recurrent, moderate: Secondary | ICD-10-CM

## 2020-12-08 LAB — GLUCOSE, CAPILLARY
Glucose-Capillary: 92 mg/dL (ref 70–99)
Glucose-Capillary: 93 mg/dL (ref 70–99)
Glucose-Capillary: 118 mg/dL — ABNORMAL HIGH (ref 70–99)
Glucose-Capillary: 64 mg/dL — ABNORMAL LOW (ref 70–99)
Glucose-Capillary: 73 mg/dL (ref 70–99)

## 2020-12-08 LAB — ECHOCARDIOGRAM COMPLETE BUBBLE STUDY
AR max vel: 1.54 cm2
AV Peak grad: 9.4 mmHg
Ao pk vel: 1.53 m/s
Area-P 1/2: 2.81 cm2
S' Lateral: 3.53 cm

## 2020-12-08 MED ORDER — CARVEDILOL 12.5 MG PO TABS
12.5000 mg | ORAL_TABLET | Freq: Two times a day (BID) | ORAL | Status: DC
  Administered 2020-12-08 – 2020-12-10 (×4): 12.5 mg via ORAL
  Filled 2020-12-08: qty 1
  Filled 2020-12-08: qty 2
  Filled 2020-12-08 (×3): qty 1
  Filled 2020-12-08: qty 2
  Filled 2020-12-08: qty 1
  Filled 2020-12-08: qty 2

## 2020-12-08 MED ORDER — AMLODIPINE BESYLATE 5 MG PO TABS
5.0000 mg | ORAL_TABLET | Freq: Every day | ORAL | Status: DC
  Administered 2020-12-09 – 2020-12-10 (×2): 5 mg via ORAL
  Filled 2020-12-08 (×2): qty 1

## 2020-12-08 NOTE — Progress Notes (Addendum)
TTE with bubble study is pending.   Addendum: TTE report conclusions: 1. Left ventricular ejection fraction, by estimation, is 55 to 60%. The  left ventricle has normal function. The left ventricle has no regional  wall motion abnormalities. Left ventricular diastolic parameters were  normal. The average left ventricular  global longitudinal strain is -19.6 %. The global longitudinal strain is  normal.   2. Right ventricular systolic function is normal. The right ventricular  size is normal.   3. The mitral valve is normal in structure. Trivial mitral valve  regurgitation.   4. The aortic valve was not well visualized. Aortic valve regurgitation  is not visualized.   Assessment/Recommendations: - Outpatient Neurology follow up for possible early-stage vascular dementia and stroke.  - Continue Plavix. May consider adding ASA if indicated given her history of CAD s/p DES - Continue rosuvastatin. CK was normal.  - Hypercoagulable panel.  - Neurohospitalist service will sign off. Please call if there are additional questions.   Electronically signed: Dr. Kerney Elbe

## 2020-12-08 NOTE — Progress Notes (Signed)
*  PRELIMINARY RESULTS* Echocardiogram 2D Echocardiogram has been performed. A Bubble Study (Saline Microcavitation) was requested and performed on this study.  [name] 12/08/2020, 11:53 AM

## 2020-12-08 NOTE — Progress Notes (Signed)
PROGRESS NOTE  [name] JGO:[id number] DOB: 18-Dec-1970 DOA: 12/05/2020 PCP: [name], MD  HPI/Recap of past 24 hours:  brief Narrative:  This is a 50 year old female with past medical history significant for coronary disease, type 2 diabetes mellitus, hypertension, who presented to the emergency department with acute onset of altered mental status with increased slurred speech and global generalized weakness.  Patient denies any muscle weakness or paresthesia patient also reported having increased urinary frequency associated with dysuria and lower abdominal pain.  Patient dates that she has had recurrent urinary tract infection.  Patient also reported as having headache and blurry vision.  She was hypertensive in the ED other vital signs were stable CT scan did not show any acute abnormality.  Her head CT and neck CT showed no emergent large vessel occlusion however MRI did show small focus of acute ischemia within the right middle cerebral peduncle no hemorrhage or mass-effect Neurology was therefore consulted and is following patient Her UA showed abnormality with urinary tract infection and she was started on IV ceftriaxone   Subjective: December 07, 2020:  Patient seen and examined at bedside she looked very pleasant and was happy that she was making progress and she stated that she has started to remember things now but she is still a little slow also she did intimated that she did not even remember the name of her son. [facility]/chaplain informed me that patient had had some suicidal thoughts but she did not have the intent of current amount and had complained that the doctor on the outpatient were not listening to her. I will order obtain psych consult.  I do not feel she is a threat to herself or to others at this time.   December 08, 2020: Patient seen and examined at bedside She denies any suicidal ideation today she stated the last time she felt that way was  about a 3 weeks ago Also she sees a psychiatrist but stated that she is never able to talk to that person that she really wanted to talk with somebody like a therapist Also she sees a neurologist at [facility] neurology and she has an appointment coming up on Monday due to recurrent urinary tract infection Patient also complaining of left hip pain she had fallen prior to coming to the ED    Assessment/Plan: Principal Problem:   Major depressive disorder, recurrent episode, moderate ([place]) Active Problems:   Altered mental status Acute metabolic encephalopathy could be multifactorial. Patient presented with altered mental status and slurring of her speech CT head was done and it was normal but MRI showed small focus of acute ischemia within the mid right middle cerebral parenchymal Neurology was consulted and he also ordered a bubble study we are waiting for the bubble study    UTI: Her UA is consistent with UTI she was started on ceftriaxone Follow-up culture showed E. coli. Patient will complete ceftriaxone and we will switch her to oral antibiotics probably be able to go home tomorrow   Slurred speech rule out TIA Mariea Stable CVA: MRI shows some small focus of acute ischemia within the right middle cerebellar peduncle Neurology is following Continue physical therapy and Occupational Therapy evaluation Continue neurochecks Bubble study was ordered.     Hypertensive urgency: Patient initially came in with hypertensive urgency and due to stroke rule out permissive blood pressure was allowed Will resume regular blood pressure medicine as needed or reasonable    Type 2 diabetes: Continue regular insulin  sliding scale  Dyslipidemia: Continue statins  Hypothyroidism: Continue levothyroxine   CAD:  continue Imdur aspirin and Coreg.  Depression.  Patient is already on medication.  She is requesting to speak with somebody.  She says she had a mental health provider but they were  never answer her call I will want to speak. I have consulted psychiatry  Code Status:   Severity of Illness: The appropriate patient status for this patient is INPATIENT. Inpatient status is judged to be reasonable and necessary in order to provide the required intensity of service to ensure the patient's safety. The patient's presenting symptoms, physical exam findings, and initial radiographic and laboratory data in the context of their chronic comorbidities is felt to place them at high risk for further clinical deterioration. Furthermore, it is not anticipated that the patient will be medically stable for discharge from the hospital within 2 midnights of admission.   * I certify that at the point of admission it is my clinical judgment that the patient will require inpatient hospital care spanning beyond 2 midnights from the point of admission due to high intensity of service, high risk for further deterioration and high frequency of surveillance required.*   Family Communication:   Disposition Plan:   Status is: Inpatient   Dispo: The patient is from: Home              Anticipated d/c is to:               Anticipated d/c date is:               Patient currently not medically stable for discharge  Consultants: Neurology  Procedures: None  Antimicrobials: Ceftriaxone  DVT prophylaxis:   Lovenox  Objective: Vitals:   12/08/20 0024 12/08/20 0524 12/08/20 0758 12/08/20 1151  BP: (!) 158/71 (!) 160/71 (!) 186/72 (!) 155/86  Pulse: (!) 56 (!) 52 (!) 56 65  Resp: 16 16 16 16   Temp: 98.6 F (37 C) 97.9 F (36.6 C) 98.4 F (36.9 C) 97.9 F (36.6 C)  TempSrc: Oral Oral Oral Oral  SpO2: 99% 99% 97% 100%  Weight:      Height:        Intake/Output Summary (Last 24 hours) at 12/08/2020 1904 Last data filed at 12/08/2020 1859 Gross per 24 hour  Intake 2210.67 ml  Output 600 ml  Net 1610.67 ml    Filed Weights   12/05/20 1837  Weight: 72.5 kg   Body mass index is 29.23  kg/m.  Exam:  General: 50 y.o. year-old female well developed well nourished in no acute distress.  Alert and oriented x3. Cardiovascular: Regular rate and rhythm with no rubs or gallops.  No thyromegaly or JVD noted.   Respiratory: Clear to auscultation with no wheezes or rales. Good inspiratory effort. Abdomen: Soft nontender nondistended with normal bowel sounds x4 quadrants. Musculoskeletal: No lower extremity edema. 2/4 pulses in all 4 extremities. Skin: No ulcerative lesions noted or rashes, Psychiatry: Mood is appropriate for condition and setting Neurology:    Data Reviewed: CBC: Recent Labs  Lab 12/05/20 1844 12/06/20 0536 12/07/20 0252  WBC 10.4 8.5 7.8  NEUTROABS 8.9*  --   --   HGB 13.4 11.9* 10.4*  HCT 41.9 37.0 33.8*  MCV 81.0 80.8 81.8  PLT 186 162 137*    Basic Metabolic Panel: Recent Labs  Lab 12/05/20 1844 12/06/20 0536 12/07/20 0252  NA 140 141 137  K 4.5 4.3 4.5  CL 106 105  104  CO2 24 26 26   GLUCOSE 146* 105* 90  BUN 25* 23* 29*  CREATININE 1.68* 1.51* 1.89*  CALCIUM 9.4 9.0 8.5*  MG  --   --  1.8  PHOS  --   --  3.7    GFR: Estimated Creatinine Clearance: 32.5 mL/min (A) (by C-G formula based on SCr of 1.89 mg/dL (H)). Liver Function Tests: Recent Labs  Lab 12/05/20 1844  AST 19  ALT 13  ALKPHOS 138*  BILITOT 0.9  PROT 7.6  ALBUMIN 3.9    Recent Labs  Lab 12/05/20 1844  LIPASE 28    No results for input(s): AMMONIA in the last 168 hours. Coagulation Profile: Recent Labs  Lab 12/05/20 1844  INR 1.1    Cardiac Enzymes: Recent Labs  Lab 12/07/20 0252  CKTOTAL 52    BNP (last 3 results) No results for input(s): PROBNP in the last 8760 hours. HbA1C: Recent Labs    12/06/20 0536  HGBA1C 5.3    CBG: Recent Labs  Lab 12/07/20 2058 12/08/20 0757 12/08/20 1150 12/08/20 1649 12/08/20 1727  GLUCAP 84 92 93 64* 118*    Lipid Profile: Recent Labs    12/06/20 0536  CHOL 179  HDL 28*  LDLCALC 124*   TRIG 137  CHOLHDL 6.4    Thyroid Function Tests: Recent Labs    12/07/20 0252  TSH 1.489    Anemia Panel: No results for input(s): VITAMINB12, FOLATE, FERRITIN, TIBC, IRON, RETICCTPCT in the last 72 hours. Urine analysis:    Component Value Date/Time   COLORURINE YELLOW (A) 12/05/2020 2142   APPEARANCEUR CLOUDY (A) 12/05/2020 2142   APPEARANCEUR Cloudy (A) 11/04/2020 1015   LABSPEC 1.016 12/05/2020 2142   LABSPEC 1.012 12/29/2013 1210   PHURINE 5.0 12/05/2020 2142   GLUCOSEU NEGATIVE 12/05/2020 2142   GLUCOSEU >=500 12/29/2013 1210   HGBUR LARGE (A) 12/05/2020 2142   HGBUR negative 04/14/2010 1435   BILIRUBINUR NEGATIVE 12/05/2020 2142   BILIRUBINUR Negative 11/04/2020 1015   BILIRUBINUR Negative 12/29/2013 1210   KETONESUR NEGATIVE 12/05/2020 2142   PROTEINUR 100 (A) 12/05/2020 2142   UROBILINOGEN 0.2 04/18/2020 1326   UROBILINOGEN negative 04/14/2010 1435   NITRITE NEGATIVE 12/05/2020 2142   LEUKOCYTESUR LARGE (A) 12/05/2020 2142   LEUKOCYTESUR Negative 12/29/2013 1210   Sepsis Labs: @LABRCNTIP (procalcitonin:4,lacticidven:4)  ) Recent Results (from the past 240 hour(s))  Resp Panel by RT-PCR (Flu A&B, Covid) Nasopharyngeal Swab     Status: None   Collection Time: 12/05/20  8:26 PM   Specimen: Nasopharyngeal Swab; Nasopharyngeal(NP) swabs in vial transport medium  Result Value Ref Range Status   SARS Coronavirus 2 by RT PCR NEGATIVE NEGATIVE Final    Comment: (NOTE) SARS-CoV-2 target nucleic acids are NOT DETECTED.  The SARS-CoV-2 RNA is generally detectable in upper respiratory specimens during the acute phase of infection. The lowest concentration of SARS-CoV-2 viral copies this assay can detect is 138 copies/mL. A negative result does not preclude SARS-Cov-2 infection and should not be used as the sole basis for treatment or other patient management decisions. A negative result may occur with  improper specimen collection/handling, submission of specimen  other than nasopharyngeal swab, presence of viral mutation(s) within the areas targeted by this assay, and inadequate number of viral copies(<138 copies/mL). A negative result must be combined with clinical observations, patient history, and epidemiological information. The expected result is Negative.  Fact Sheet for Patients:  EntrepreneurPulse.com.au  Fact Sheet for Healthcare Providers:  IncredibleEmployment.be  This test is no  t yet approved or cleared by the [place] and  has been authorized for detection and/or diagnosis of SARS-CoV-2 by FDA under an Emergency Use Authorization (EUA). This EUA will remain  in effect (meaning this test can be used) for the duration of the COVID-19 declaration under Section 564(b)(1) of the Act, 21 U.S.C.section 360bbb-3(b)(1), unless the authorization is terminated  or revoked sooner.       Influenza A by PCR NEGATIVE NEGATIVE Final   Influenza B by PCR NEGATIVE NEGATIVE Final    Comment: (NOTE) The Xpert Xpress SARS-CoV-2/FLU/RSV plus assay is intended as an aid in the diagnosis of influenza from Nasopharyngeal swab specimens and should not be used as a sole basis for treatment. Nasal washings and aspirates are unacceptable for Xpert Xpress SARS-CoV-2/FLU/RSV testing.  Fact Sheet for Patients: EntrepreneurPulse.com.au  Fact Sheet for Healthcare Providers: IncredibleEmployment.be  This test is not yet approved or cleared by the [place] FDA and has been authorized for detection and/or diagnosis of SARS-CoV-2 by FDA under an Emergency Use Authorization (EUA). This EUA will remain in effect (meaning this test can be used) for the duration of the COVID-19 declaration under Section 564(b)(1) of the Act, 21 U.S.C. section 360bbb-3(b)(1), unless the authorization is terminated or revoked.  Performed at [facility], [address]  [place], [place] [address]   Urine Culture     Status: Abnormal   Collection Time: 12/05/20  9:42 PM   Specimen: Urine, Clean Catch  Result Value Ref Range Status   Specimen Description   Final    URINE, CLEAN CATCH Performed at [facility], [address] [place], [place] [address]    Special Requests   Final    NONE Performed at [facility], [place], [place] [address]    Culture 90,000 COLONIES/mL KLEBSIELLA PNEUMONIAE (A)  Final   Report Status 12/07/2020 FINAL  Final   Organism ID, Bacteria KLEBSIELLA PNEUMONIAE (A)  Final      Susceptibility   Klebsiella pneumoniae - MIC*    AMPICILLIN RESISTANT Resistant     CEFAZOLIN <=4 SENSITIVE Sensitive     CEFEPIME <=0.12 SENSITIVE Sensitive     CEFTRIAXONE <=0.25 SENSITIVE Sensitive     CIPROFLOXACIN <=0.25 SENSITIVE Sensitive     GENTAMICIN <=1 SENSITIVE Sensitive     IMIPENEM <=0.25 SENSITIVE Sensitive     NITROFURANTOIN 64 INTERMEDIATE Intermediate     TRIMETH/SULFA <=20 SENSITIVE Sensitive     AMPICILLIN/SULBACTAM 4 SENSITIVE Sensitive     PIP/TAZO <=4 SENSITIVE Sensitive     * 90,000 COLONIES/mL KLEBSIELLA PNEUMONIAE      Studies: ECHOCARDIOGRAM COMPLETE BUBBLE STUDY  Result Date: 12/08/2020    ECHOCARDIOGRAM REPORT   Patient Name:   [name] Date of Exam: 12/08/2020 Medical Rec #:  [id number]             Height:       62.0 in Accession #:    [id number]            Weight:       159.8 lb Date of Birth:  [birth year]             BSA:          1.738 m Patient Age:    20 years              BP:           186/72 mmHg Patient Gender: F  HR:           64 bpm. Exam Location:  [facility] Procedure: 2D Echo, Saline Contrast Bubble Study and Strain Analysis Indications:     Stroke I63.9  History:         Patient has prior history of Echocardiogram examinations, most                  recent 08/08/2020.  Sonographer:     Kathlen Brunswick RDCS Referring Phys:  [id number] ERIC LINDZEN Diagnosing  Phys: Kate Sable MD  Sonographer Comments: Global longitudinal strain was attempted. IMPRESSIONS  1. Left ventricular ejection fraction, by estimation, is 55 to 60%. The left ventricle has normal function. The left ventricle has no regional wall motion abnormalities. Left ventricular diastolic parameters were normal. The average left ventricular global longitudinal strain is -19.6 %. The global longitudinal strain is normal.  2. Right ventricular systolic function is normal. The right ventricular size is normal.  3. The mitral valve is normal in structure. Trivial mitral valve regurgitation.  4. The aortic valve was not well visualized. Aortic valve regurgitation is not visualized. FINDINGS  Left Ventricle: Left ventricular ejection fraction, by estimation, is 55 to 60%. The left ventricle has normal function. The left ventricle has no regional wall motion abnormalities. The average left ventricular global longitudinal strain is -19.6 %. The global longitudinal strain is normal. The left ventricular internal cavity size was normal in size. There is no left ventricular hypertrophy. Left ventricular diastolic parameters were normal. Right Ventricle: The right ventricular size is normal. No increase in right ventricular wall thickness. Right ventricular systolic function is normal. Left Atrium: Left atrial size was normal in size. Right Atrium: Right atrial size was normal in size. Pericardium: There is no evidence of pericardial effusion. Mitral Valve: The mitral valve is normal in structure. Trivial mitral valve regurgitation. Tricuspid Valve: The tricuspid valve is normal in structure. Tricuspid valve regurgitation is not demonstrated. Aortic Valve: The aortic valve was not well visualized. Aortic valve regurgitation is not visualized. Aortic valve peak gradient measures 9.4 mmHg. Pulmonic Valve: The pulmonic valve was not well visualized. Pulmonic valve regurgitation is not visualized. Aorta: The aortic root  and ascending aorta are structurally normal, with no evidence of dilitation. Venous: The inferior vena cava was not well visualized. IAS/Shunts: No atrial level shunt detected by color flow Doppler. Agitated saline contrast was given intravenously to evaluate for intracardiac shunting.  LEFT VENTRICLE PLAX 2D LVIDd:         4.95 cm   Diastology LVIDs:         3.53 cm   LV e' medial:    6.85 cm/s LV PW:         1.04 cm   LV E/e' medial:  12.1 LV IVS:        0.94 cm   LV e' lateral:   10.30 cm/s LVOT diam:     2.00 cm   LV E/e' lateral: 8.1 LV SV:         48 LV SV Index:   28        2D Longitudinal Strain LVOT Area:     3.14 cm  2D Strain GLS Avg:     -19.6 %  RIGHT VENTRICLE RV Basal diam:  2.08 cm RV S prime:     12.30 cm/s TAPSE (M-mode): 2.2 cm LEFT ATRIUM             Index  RIGHT ATRIUM          Index LA diam:        3.70 cm 2.13 cm/m   RA Area:     9.54 cm LA Vol (A2C):   41.4 ml 23.82 ml/m  RA Volume:   19.10 ml 10.99 ml/m LA Vol (A4C):   42.8 ml 24.63 ml/m LA Biplane Vol: 45.3 ml 26.07 ml/m  AORTIC VALVE                 PULMONIC VALVE AV Area (Vmax): 1.54 cm     PV Vmax:       1.06 m/s AV Vmax:        153.00 cm/s  PV Peak grad:  4.5 mmHg AV Peak Grad:   9.4 mmHg LVOT Vmax:      75.10 cm/s LVOT Vmean:     45.900 cm/s LVOT VTI:       0.153 m  AORTA Ao Root diam: 2.80 cm Ao Asc diam:  2.80 cm MITRAL VALVE MV Area (PHT): 2.81 cm    SHUNTS MV Decel Time: 270 msec    Systemic VTI:  0.15 m MV E velocity: 83.10 cm/s  Systemic Diam: 2.00 cm MV A velocity: 74.10 cm/s MV E/A ratio:  1.12 Kate Sable MD Electronically signed by Kate Sable MD Signature Date/Time: 12/08/2020/1:50:47 PM    Final    DG HIP UNILAT WITH PELVIS 2-3 VIEWS LEFT  Result Date: 12/08/2020 CLINICAL DATA:  Pain.  Recent fall. EXAM: DG HIP (WITH OR WITHOUT PELVIS) 2-3V LEFT COMPARISON:  Abdominopelvic CT 12/05/2020 FINDINGS: Intramedullary nail with trans trochanteric screw fixation of the proximal left femur. The hardware  is intact were visualized. There is no periprosthetic lucency or fracture. Intact pubic rami. Pubic symphysis and sacroiliac joints are congruent. The bones are under mineralized. There is mild bilateral hip osteoarthritis with acetabular spurring. Age advanced vascular calcifications. IMPRESSION: 1. No acute fracture of the pelvis or hip. 2. Postsurgical change of the left femur without hardware complication. Electronically Signed   By: Keith Rake M.D.   On: 12/08/2020 17:10    Scheduled Meds:   stroke: mapping our early stages of recovery book   Does not apply Once   ALPRAZolam  1 mg Oral BID   [START ON 12/09/2020] amLODipine  5 mg Oral Daily   aspirin EC  81 mg Oral Daily   buPROPion ER  100 mg Oral BID AC   carvedilol  12.5 mg Oral BID   clopidogrel  75 mg Oral Daily   desvenlafaxine  50 mg Oral q morning   diclofenac Sodium  2 g Topical QID   enoxaparin (LOVENOX) injection  40 mg Subcutaneous Q24H   estradiol  1 Applicatorful Vaginal Once per day on Mon Wed Fri   gabapentin  600 mg Oral BID   insulin aspart  0-9 Units Subcutaneous TID AC & HS   isosorbide mononitrate  120 mg Oral Daily   levothyroxine  25 mcg Oral Q0600   memantine  5 mg Oral BID   rosuvastatin  40 mg Oral q1800   traZODone  100-200 mg Oral QHS    Continuous Infusions:  cefTRIAXone (ROCEPHIN)  IV Stopped (12/08/20 0500)     LOS: 2 days     Cristal Deer, MD [facility]  To reach me or the doctor on call, go to: [email or web address] Password [email or web address]  12/08/2020, 7:04 PM
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
## 2020-12-08 NOTE — Progress Notes (Signed)
Occupational Therapy Treatment Patient Details Name: [name] MRN: [id number] DOB: 09-Jul-1970 Today's Date: 12/08/2020   History of present illness [name] is a 50 y.o. female with medical history significant for coronary artery disease, CVA with R sided deficits, type 2 diabetes mellitus and hypertension, who presented to the emergency room with acute onset of altered mental status with increased slurred speech today and global generalized weakness. Head and neck CTA revealed no emergent large vessel occlusion.   OT comments  Pt seen for OT tx this date. OT engages pt in sup to sit transition with CGA/MIN A. Pt demos G sitting balance. Pt requires CGA/MIN A. Pt requires MIN cues for safety with fxl mobility around the unit. OT engagse pt in seated ADLs including brushing hair with SETUP to MIN A (for thorough completion) and donning socks with CGA for dynamic sitting balance. Pt left with all needs met and in reach. Pt continues to progress with therapy and in general. Will continue to follow. While pt is progressing, she does still demonstrate some weakness and decreased safety awareness as evidence by pulling to the right several times with the walker in the hallway, even hitting the desk with walker, twice. Continue to anticipate at this time, that pt will require rehab stay. Should she opt to go home, she will need 24/7 SUPV to ensure safety and decreased risk for falls with ADLs/ADL mobility/IADLs.   Recommendations for follow up therapy are one component of a multi-disciplinary discharge planning process, led by the attending physician.  Recommendations may be updated based on patient status, additional functional criteria and insurance authorization.    Follow Up Recommendations  Skilled nursing-short term rehab (<3 hours/day)    Assistance Recommended at Discharge Frequent or constant Supervision/Assistance  Equipment Recommendations  [facility]    Recommendations for  Other Services      Precautions / Restrictions Precautions Precautions: Fall Restrictions Weight Bearing Restrictions: No       Mobility Bed Mobility Overal bed mobility: Needs Assistance Bed Mobility: Supine to Sit;Sit to Supine     Supine to sit: Min guard;Min assist Sit to supine: Min guard;Min assist        Transfers Overall transfer level: Needs assistance Equipment used: Rolling walker (2 wheels) Transfers: Sit to/from Stand Sit to Stand: Min guard                 Balance Overall balance assessment: Needs assistance   Sitting balance-Leahy Scale: Good     Standing balance support: Single extremity supported;No upper extremity supported;During functional activity Standing balance-Leahy Scale: Fair                             ADL either performed or assessed with clinical judgement   ADL Overall ADL's : Needs assistance/impaired     Grooming: Brushing hair;Set up;Minimal assistance;Sitting Grooming Details (indicate cue type and reason): MIN A for thorough completion             Lower Body Dressing: Set up;Min guard;Sitting/lateral leans Lower Body Dressing Details (indicate cue type and reason): for balance with seated donning socks d/t dynamic nature of task             Functional mobility during ADLs: Min guard;Rolling walker (2 wheels) (2 laps around nursing station with two standing rest breaks, RW for UE support, cues for safety/hazard avoidance, noted to pull towards the right)  Vision Patient Visual Report: No change from baseline     Perception     Praxis      Cognition Arousal/Alertness: Awake/alert Behavior During Therapy: WFL for tasks assessed/performed Overall Cognitive Status: Within Functional Limits for tasks assessed                                 General Comments: A&O, somewhat slow to respond, some forgetfullness which pt acknowledges.          Exercises Other  Exercises Other Exercises: OT ed re: safety considerations/fall prevention with RW, scanning environment. Pt with good understanding.   Shoulder Instructions       General Comments      Pertinent Vitals/ Pain       Pain Score: 5  Pain Location: R wrist Pain Descriptors / Indicators: Tender Pain Intervention(s): Limited activity within patient's tolerance;Monitored during session  Home Living                                          Prior Functioning/Environment              Frequency  Min 2X/week        Progress Toward Goals  OT Goals(current goals can now be found in the care plan section)  Progress towards OT goals: Progressing toward goals  Acute Rehab OT Goals Patient Stated Goal: get stronger, stop falling OT Goal Formulation: With patient Time For Goal Achievement: 12/20/20 Potential to Achieve Goals: Good  Plan Discharge plan remains appropriate    Co-evaluation                 AM-PAC OT "6 Clicks" Daily Activity     Outcome Measure   Help from another person eating meals?: None Help from another person taking care of personal grooming?: A Little Help from another person toileting, which includes using toliet, bedpan, or urinal?: A Little Help from another person bathing (including washing, rinsing, drying)?: A Little Help from another person to put on and taking off regular upper body clothing?: A Little Help from another person to put on and taking off regular lower body clothing?: A Little 6 Click Score: 19    End of Session Equipment Utilized During Treatment: Gait belt;Rolling walker (2 wheels)  OT Visit Diagnosis: Other abnormalities of gait and mobility (R26.89);Repeated falls (R29.6)   Activity Tolerance Patient tolerated treatment well   Patient Left in bed;with call bell/phone within reach   Nurse Communication Mobility status        Time: 3837-7939 OT Time Calculation (min): 24 min  Charges: OT  General Charges $OT Visit: 1 Visit OT Treatments $Self Care/Home Management : 8-22 mins $Therapeutic Activity: 8-22 mins  Gerrianne Scale, MS, OTR/L ascom [phone] 12/08/20, 3:23 PM

## 2020-12-08 NOTE — Progress Notes (Signed)
Patient [name] prior to supper 62, recheck was 64. Patient asymptomatic. Messaged MD who replied immediately. Patients dinner tray arrived while messaging with MD. Recheck [name]=118.

## 2020-12-09 ENCOUNTER — Ambulatory Visit: Payer: Medicare Other | Admitting: Urology

## 2020-12-09 ENCOUNTER — Telehealth: Payer: Self-pay | Admitting: Urology

## 2020-12-09 LAB — COMPREHENSIVE METABOLIC PANEL
ALT: 16 U/L (ref 0–44)
AST: 20 U/L (ref 15–41)
Albumin: 3.6 g/dL (ref 3.5–5.0)
Alkaline Phosphatase: 108 U/L (ref 38–126)
Anion gap: 9 (ref 5–15)
BUN: 31 mg/dL — ABNORMAL HIGH (ref 6–20)
CO2: 28 mmol/L (ref 22–32)
Calcium: 8.9 mg/dL (ref 8.9–10.3)
Chloride: 105 mmol/L (ref 98–111)
Creatinine, Ser: 1.68 mg/dL — ABNORMAL HIGH (ref 0.44–1.00)
GFR, Estimated: 36 mL/min — ABNORMAL LOW (ref >60)
Glucose, Bld: 82 mg/dL (ref 70–99)
Potassium: 4.2 mmol/L (ref 3.5–5.1)
Sodium: 142 mmol/L (ref 135–145)
Total Bilirubin: 0.5 mg/dL (ref 0.3–1.2)
Total Protein: 6.5 g/dL (ref 6.5–8.1)

## 2020-12-09 LAB — ANTITHROMBIN III: AntiThromb III Func: 110 % (ref 75–120)

## 2020-12-09 LAB — CBC WITH DIFFERENTIAL/PLATELET
Abs Immature Granulocytes: 0.03 10*3/uL (ref 0.00–0.07)
Basophils Absolute: 0.1 10*3/uL (ref 0.0–0.1)
Basophils Relative: 1 %
Eosinophils Absolute: 0.2 10*3/uL (ref 0.0–0.5)
Eosinophils Relative: 4 %
HCT: 37.4 % (ref 36.0–46.0)
Hemoglobin: 11.8 g/dL — ABNORMAL LOW (ref 12.0–15.0)
Immature Granulocytes: 1 %
Lymphocytes Relative: 23 %
Lymphs Abs: 1.2 10*3/uL (ref 0.7–4.0)
MCH: 26.2 pg (ref 26.0–34.0)
MCHC: 31.6 g/dL (ref 30.0–36.0)
MCV: 82.9 fL (ref 80.0–100.0)
Monocytes Absolute: 0.3 10*3/uL (ref 0.1–1.0)
Monocytes Relative: 6 %
Neutro Abs: 3.5 10*3/uL (ref 1.7–7.7)
Neutrophils Relative %: 65 %
Platelets: 137 10*3/uL — ABNORMAL LOW (ref 150–400)
RBC: 4.51 MIL/uL (ref 3.87–5.11)
RDW: 14.4 % (ref 11.5–15.5)
WBC: 5.3 10*3/uL (ref 4.0–10.5)
nRBC: 0 % (ref 0.0–0.2)

## 2020-12-09 LAB — GLUCOSE, CAPILLARY
Glucose-Capillary: 76 mg/dL (ref 70–99)
Glucose-Capillary: 87 mg/dL (ref 70–99)
Glucose-Capillary: 88 mg/dL (ref 70–99)
Glucose-Capillary: 104 mg/dL — ABNORMAL HIGH (ref 70–99)

## 2020-12-09 MED ORDER — SODIUM CHLORIDE 0.9 % IV SOLN: INTRAVENOUS | Status: DC | PRN

## 2020-12-09 MED ORDER — PANTOPRAZOLE SODIUM 40 MG PO TBEC
40.0000 mg | DELAYED_RELEASE_TABLET | Freq: Every day | ORAL | Status: DC
  Administered 2020-12-09 – 2020-12-10 (×2): 40 mg via ORAL
  Filled 2020-12-09 (×2): qty 1

## 2020-12-09 MED ORDER — CEFADROXIL 500 MG PO CAPS
500.0000 mg | ORAL_CAPSULE | Freq: Two times a day (BID) | ORAL | Status: DC
  Administered 2020-12-09 – 2020-12-10 (×2): 500 mg via ORAL
  Filled 2020-12-09 (×3): qty 1

## 2020-12-09 NOTE — Care Management Important Message (Signed)
Important Message  Patient Details  Name: [name] MRN: [id number] Date of Birth: 1970-12-25   Medicare Important Message Given:  Yes     Juliann Pulse A Casey Fye 12/09/2020, 2:49 PM

## 2020-12-09 NOTE — TOC Progression Note (Addendum)
Transition of Care [facility]) - Progression Note    Patient Details  Name: [name] MRN: [id number] Date of Birth: October 04, 1970  Transition of Care [facility]) CM/SW [place], RN Phone Number: 12/09/2020, 3:38 PM  Clinical Narrative:   Home health agencies contacted [facility]- no due to insurance [employer]-not able to help this week [facility]-not able to take patient due to insurance [employer]-will call back [facility]-will call back--addendum:  [facility] cannot accept patient 1626pm       Barriers to Discharge: Continued Medical Work up  Expected Discharge Plan and Services     Discharge Planning Services: CM Consult Post Acute Care Choice: [place] arrangements for the past 2 months: Single Family Home                           HH Arranged: RN, PT, OT [facility] Agency:  (TBD, reached out to [employer], awaiting call back)         Social Determinants of Health (SDOH) Interventions    Readmission Risk Interventions Readmission Risk Prevention Plan 11/07/2019 10/18/2019 09/05/2019  Transportation Screening Complete Complete Complete  Medication Review Press photographer) Complete Complete Complete  PCP or Specialist appointment within 3-5 days of discharge (No Data) Complete Complete  HRI or Home Care Consult Complete Complete -  SW Recovery Care/Counseling Consult - Complete Complete  SW Consult Not Complete Comments - - -  Palliative Care Screening Not Applicable Not Applicable Not [place] Not Applicable Not Applicable Not Applicable  Some recent data might be hidden

## 2020-12-09 NOTE — TOC Initial Note (Signed)
Transition of Care [facility]) - Initial/Assessment Note    Patient Details  Name: [name] MRN: [id number] Date of Birth: 1970-04-17  Transition of Care [facility]) CM/SW Contact:    Pete Pelt, RN Phone Number: 12/09/2020, 9:41 AM  Clinical Narrative:   Patient lives at home with son.  Son works 1 day on and 2 days off.  Patient and son refuse SNF placement.  They state that she can have 24 hour supervision between son and patient's friend.    Patient states she typically drives herself to local appointments, but she can have friend drive her after discharge.  Patient has walker and BSC at home, states she does not need DME.  TOC contact information provided, TOC to follow              Barriers to Discharge: Continued Medical Work up   Patient Goals and CMS Choice     Choice offered to / list presented to : NA  Expected Discharge Plan and Services     Discharge Planning Services: CM Consult Post Acute Care Choice: [place] arrangements for the past 2 months: Single Family Home                           HH Arranged: RN, PT, OT [place] Agency:  (TBD, reached out to [employer], awaiting call back)        Prior Living Arrangements/Services Living arrangements for the past 2 months: Single Family Home Lives with:: Self, Relatives Patient language and need for interpreter reviewed:: Yes (No need for interpreter.) Do you feel safe going back to the place where you live?: Yes      Need for Family Participation in Patient Care: Yes (Comment) Care giver support system in place?: Yes (comment) Current home services: Home RN, Home PT, Home OT Criminal Activity/Legal Involvement Pertinent to Current Situation/Hospitalization: No - Comment as needed  Activities of Daily Living Home Assistive Devices/Equipment: Walker (specify type) (does not use) ADL Screening (condition at time of admission) Patient's cognitive ability adequate to safely complete daily  activities?: Yes Is the patient deaf or have difficulty hearing?: No Does the patient have difficulty seeing, even when wearing glasses/contacts?: Yes Does the patient have difficulty concentrating, remembering, or making decisions?: No Patient able to express need for assistance with ADLs?: Yes Does the patient have difficulty dressing or bathing?: Yes Independently performs ADLs?: No Does the patient have difficulty walking or climbing stairs?: Yes Weakness of Legs: Both Weakness of Arms/Hands: Both  Permission Sought/Granted Permission sought to share information with : Case Manager Permission granted to share information with : Yes, Verbal Permission Granted     Permission granted to share info w AGENCY: home health agency        Emotional Assessment Appearance:: Appears stated age Attitude/Demeanor/Rapport: Apprehensive Affect (typically observed): Appropriate Orientation: : Oriented to Self, Oriented to Place, Oriented to  Time, Oriented to Situation Alcohol / Substance Use: Not Applicable Psych Involvement: Yes (comment) (Psych as seen patient this admission)  Admission diagnosis:  Altered mental status [R41.82] Urinary tract infection without hematuria, site unspecified [N39.0] Altered mental status, unspecified altered mental status type [R41.82] Patient Active Problem List   Diagnosis Date Noted   Major depressive disorder, recurrent episode, moderate ([place]) 12/08/2020   Altered mental status 12/05/2020   CVA (cerebral vascular accident) ([place]) 08/07/2020   Unspecified atrial fibrillation ([place]) 04/23/2020   Family history of malignant neoplasm of gastrointestinal tract  Iron deficiency anemia secondary to blood loss (chronic)    Near syncope 01/25/2020   Diabetes mellitus ([place]) 01/25/2020   Pulmonary embolism ([place]) 11/07/2019   Chest pain 11/05/2019   Type II diabetes mellitus with renal manifestations ([place]) 11/05/2019   Acute renal failure superimposed on stage  3a chronic kidney disease ([place]) 09/02/2019   Hypoglycemia    Family history of breast cancer 05/23/2019   Family history of colon cancer 05/23/2019   Retinopathy 05/12/2019   Abnormal LFTs 04/18/2019   Esophageal dysphagia    Gastrointestinal tract imaging abnormality    Trochanteric bursitis, left hip 03/17/2019   Malignant neoplasm of vulva, unspecified ([place]) 03/10/2019   Peripheral vascular disease, unspecified ([place]) 03/10/2019   Paroxysmal supraventricular tachycardia ([place]) 03/10/2019   Recurrent chest pain 01/19/2019   Depression, major, single episode, moderate ([place]) 12/09/2018   Closed nondisplaced intertrochanteric fracture of left femur ([place]) 10/12/2018   Hypotension 06/26/2018   Autonomic neuropathy 03/24/2018   Acute delirium 03/03/2018   H/O gastric bypass 03/02/2018   Hypertension associated with stage 3 chronic kidney disease due to type 2 diabetes mellitus ([place]) 02/20/2018   Insomnia 03/18/2017   Ischemic cardiomyopathy    Arthritis    Anxiety    Hx of colonic polyps    H/O medication noncompliance 12/14/2015   Coronary artery disease involving native coronary artery of native heart with angina pectoris ([place])    Hypertensive heart disease    Status post bariatric surgery 06/04/2015   Carotid stenosis 04/30/2015   Iron deficiency anemia 03/22/2015   Vitamin B12 deficiency 02/18/2015   Major depressive disorder, recurrent, severe with psychotic features ([place]) 48/18/5631   Helicobacter pylori infection 11/23/2014   Malignant melanoma ([place]) 08/25/2014   Incomplete bladder emptying 07/12/2014   Hypothyroidism 12/30/2013   Aberrant subclavian artery 11/17/2013   Multiple sclerosis ([place]) 11/02/2013   History of CVA with residual deficit 06/20/2013   Headache, migraine 05/29/2013   Hyperlipidemia    GERD (gastroesophageal reflux disease)    Neuropathy ([place]) 01/02/2011   Stroke ([place]) 06/21/2008   Depression with anxiety 05/01/2008   Essential hypertension  05/01/2008   PCP:  [name], MD Pharmacy:   [facility] [address] ([address]), [place] ([place]) [place] [address] Phone: [phone] Fax: [place] ([place]), [place] - [address] [place] ([place]) [place] [address] Phone: [phone] Fax: [phone]  [id number] - Closed - [place], [place] - [address] [place] [address] Phone: [phone] Fax: [phone]  [place], [place] [address] [place] [address] Phone: [phone] Fax: [phone]     Social Determinants of Health (SDOH) Interventions    Readmission Risk Interventions Readmission Risk Prevention Plan 11/07/2019 10/18/2019 09/05/2019  Transportation Screening Complete Complete Complete  Medication Review Press photographer) Complete Complete Complete  PCP or Specialist appointment within 3-5 days of discharge (No Data) Complete Complete  HRI or Home Care Consult Complete Complete -  SW Recovery Care/Counseling Consult - Complete Complete  SW Consult Not Complete Comments - - -  Palliative Care Screening Not Applicable Not Applicable Not [place] Not Applicable Not Applicable Not Applicable  Some recent data might be hidden

## 2020-12-09 NOTE — Telephone Encounter (Signed)
Pt just called to let us know she is currently in the hospital.  She has been there since last Thursday with a UTI.  She wanted to know if MacDiarmid would prescribe any abx.  She didn't want to miss appt today, but hasn't been discharged yet.  She would like to speak with someone from our office.  If you can't get her on her cell# her room# is [id number].

## 2020-12-09 NOTE — Progress Notes (Signed)
CEFEPIME <=0.12 SENSITIVE Sensitive     CEFTRIAXONE <=0.25 SENSITIVE Sensitive     CIPROFLOXACIN <=0.25 SENSITIVE Sensitive     GENTAMICIN <=1 SENSITIVE Sensitive     IMIPENEM <=0.25 SENSITIVE Sensitive     NITROFURANTOIN 64 INTERMEDIATE Intermediate     TRIMETH/SULFA <=20 SENSITIVE Sensitive     AMPICILLIN/SULBACTAM 4 SENSITIVE Sensitive     PIP/TAZO <=4 SENSITIVE Sensitive     * 90,000 COLONIES/mL KLEBSIELLA PNEUMONIAE         Radiology Studies: ECHOCARDIOGRAM COMPLETE BUBBLE STUDY  Result Date: 12/08/2020    ECHOCARDIOGRAM REPORT   Patient Name:   [place] Date of Exam: 12/08/2020 Medical Rec #:  [id number]             Height:       62.0 in Accession #:    [id number]            Weight:       159.8 lb Date of Birth:  [birth year]             BSA:          1.738 m Patient Age:    50 years              BP:           186/72 mmHg Patient Gender: F                     HR:           64 bpm. Exam Location:  [facility] Procedure: 2D Echo, Saline Contrast Bubble Study and Strain Analysis Indications:     Stroke I63.9  History:         Patient has prior history of Echocardiogram examinations, most                  recent 08/08/2020.  Sonographer:     Kathlen Brunswick RDCS Referring Phys:  [id number] ERIC LINDZEN Diagnosing Phys: Kate Sable MD  Sonographer Comments: Global longitudinal strain was attempted. IMPRESSIONS  1. Left ventricular ejection  fraction, by estimation, is 55 to 60%. The left ventricle has normal function. The left ventricle has no regional wall motion abnormalities. Left ventricular diastolic parameters were normal. The average left ventricular global longitudinal strain is -19.6 %. The global longitudinal strain is normal.  2. Right ventricular systolic function is normal. The right ventricular size is normal.  3. The mitral valve is normal in structure. Trivial mitral valve regurgitation.  4. The aortic valve was not well visualized. Aortic valve regurgitation is not visualized. FINDINGS  Left Ventricle: Left ventricular ejection fraction, by estimation, is 55 to 60%. The left ventricle has normal function. The left ventricle has no regional wall motion abnormalities. The average left ventricular global longitudinal strain is -19.6 %. The global longitudinal strain is normal. The left ventricular internal cavity size was normal in size. There is no left ventricular hypertrophy. Left ventricular diastolic parameters were normal. Right Ventricle: The right ventricular size is normal. No increase in right ventricular wall thickness. Right ventricular systolic function is normal. Left Atrium: Left atrial size was normal in size. Right Atrium: Right atrial size was normal in size. Pericardium: There is no evidence of pericardial effusion. Mitral Valve: The mitral valve is normal in structure. Trivial mitral valve regurgitation. Tricuspid Valve: The tricuspid valve is normal in structure. Tricuspid valve regurgitation is not demonstrated. Aortic Valve: The aortic valve was not well visualized. Aortic valve regurgitation is not visualized.  PROGRESS NOTE    Taura Lamarre  VZC:[id number] DOB: 07-13-1970 DOA: 12/05/2020 PCP: [name], MD   Chief Complaint  Patient presents with   Altered Mental Status    Brief Narrative:  50 year old female with past medical history significant for coronary disease, type 2 diabetes mellitus, hypertension, who presented to the emergency department with acute onset of altered mental status with increased slurred speech and global generalized weakness.  She's been treated for Jamahl Lemmons UTI, hypertensive emergency, and CVA.  See below for additional details   Assessment & Plan:   Principal Problem:   Major depressive disorder, recurrent episode, moderate ([place]) Active Problems:   Altered mental status  Plan for home tonight, no ride, no safe d/c plan.  Will plan for tomorrow if possible after discussing abdominal imaging with GI (see below)  Acute Metabolic Encephalopathy Suspected 2/2 hypertensive encephalopathy.  Stroke and UTI could be contributing. Seems improved at this point Treatment as below Memantine d/c'd per neurology, associated with worsening mentation  Acute Ischemia within Right Middle Cerebellar Peduncle MRI with stroke Neurology c/s,   - recommending BP management, goal 120-140 - needs improved long term BP   - d/c memantine   - outpatient neurology follow up for possible vascular dementia and stroke  - echo with normal EF, 12-87%, grade 1 diastolic dysfunction - echo with bubble study without atrial level shunt detected by color flow doppler  - CTA head/neck without LVO, moderate narrowing of R vertebral artery V4 segment  - tele  - PT/OT/SLP recommending SNF  - continue crestor, recommending continue plavix - consider adding ASA if indicated given hx CAD s/p DES   - LDL 124, A1c wnl  - hypercoagulable panel pending  Klebsiella UTI Transition to cefadroxil from ceftriaxone  Hypertensive Emergency Thought to contribute to encephalopathy BP  improved Goal 120-140 per neuro Continue amlodipine 5, coreg 12.5 BID, lasx 20 (prn), imdur - will adjust as needed Needs close outpatient follow up  T2DM A1c wnl, appears diet controlled now  Hypothyroidism Synthroid  CAD s/p DES Saw cardiology in 11/2020, at that time, recommended for her to continue aspirin, plavix, crestor, and imdur (will clarify if possible as it seems DAPT interrupted in setting of GI bleed earlier in year, I think she was only taking plavix prior to admission)  1.3 CM hyperdense focus seen within proximal duodenum in the area of prior hemorrhage Need to discuss with GI to determine if ok to follow outpatient  Hx GI bleed 2/2 Gastric Ulcer S/p EGD with ulceration at [place] anastomosis, treated 03/2020 Start protonix with plan for DAPT  DVT prophylaxis: lovenox Code Status: full Family Communication: son over phone Disposition:   Status is: Inpatient  Remains inpatient appropriate because: no safe d/c plan       Consultants:  Neurology psychiatry  Procedures:  Echo IMPRESSIONS     1. Left ventricular ejection fraction, by estimation, is 55 to 60%. The  left ventricle has normal function. The left ventricle has no regional  wall motion abnormalities. Left ventricular diastolic parameters were  normal. The average left ventricular  global longitudinal strain is -19.6 %. The global longitudinal strain is  normal.   2. Right ventricular systolic function is normal. The right ventricular  size is normal.   3. The mitral valve is normal in structure. Trivial mitral valve  regurgitation.   4. The aortic valve was not well visualized. Aortic valve regurgitation  is not visualized.   Echo IMPRESSIONS  CEFEPIME <=0.12 SENSITIVE Sensitive     CEFTRIAXONE <=0.25 SENSITIVE Sensitive     CIPROFLOXACIN <=0.25 SENSITIVE Sensitive     GENTAMICIN <=1 SENSITIVE Sensitive     IMIPENEM <=0.25 SENSITIVE Sensitive     NITROFURANTOIN 64 INTERMEDIATE Intermediate     TRIMETH/SULFA <=20 SENSITIVE Sensitive     AMPICILLIN/SULBACTAM 4 SENSITIVE Sensitive     PIP/TAZO <=4 SENSITIVE Sensitive     * 90,000 COLONIES/mL KLEBSIELLA PNEUMONIAE         Radiology Studies: ECHOCARDIOGRAM COMPLETE BUBBLE STUDY  Result Date: 12/08/2020    ECHOCARDIOGRAM REPORT   Patient Name:   [place] Date of Exam: 12/08/2020 Medical Rec #:  [id number]             Height:       62.0 in Accession #:    [id number]            Weight:       159.8 lb Date of Birth:  [birth year]             BSA:          1.738 m Patient Age:    50 years              BP:           186/72 mmHg Patient Gender: F                     HR:           64 bpm. Exam Location:  [facility] Procedure: 2D Echo, Saline Contrast Bubble Study and Strain Analysis Indications:     Stroke I63.9  History:         Patient has prior history of Echocardiogram examinations, most                  recent 08/08/2020.  Sonographer:     Kathlen Brunswick RDCS Referring Phys:  [id number] ERIC LINDZEN Diagnosing Phys: Kate Sable MD  Sonographer Comments: Global longitudinal strain was attempted. IMPRESSIONS  1. Left ventricular ejection  fraction, by estimation, is 55 to 60%. The left ventricle has normal function. The left ventricle has no regional wall motion abnormalities. Left ventricular diastolic parameters were normal. The average left ventricular global longitudinal strain is -19.6 %. The global longitudinal strain is normal.  2. Right ventricular systolic function is normal. The right ventricular size is normal.  3. The mitral valve is normal in structure. Trivial mitral valve regurgitation.  4. The aortic valve was not well visualized. Aortic valve regurgitation is not visualized. FINDINGS  Left Ventricle: Left ventricular ejection fraction, by estimation, is 55 to 60%. The left ventricle has normal function. The left ventricle has no regional wall motion abnormalities. The average left ventricular global longitudinal strain is -19.6 %. The global longitudinal strain is normal. The left ventricular internal cavity size was normal in size. There is no left ventricular hypertrophy. Left ventricular diastolic parameters were normal. Right Ventricle: The right ventricular size is normal. No increase in right ventricular wall thickness. Right ventricular systolic function is normal. Left Atrium: Left atrial size was normal in size. Right Atrium: Right atrial size was normal in size. Pericardium: There is no evidence of pericardial effusion. Mitral Valve: The mitral valve is normal in structure. Trivial mitral valve regurgitation. Tricuspid Valve: The tricuspid valve is normal in structure. Tricuspid valve regurgitation is not demonstrated. Aortic Valve: The aortic valve was not well visualized. Aortic valve regurgitation is not visualized.  CEFEPIME <=0.12 SENSITIVE Sensitive     CEFTRIAXONE <=0.25 SENSITIVE Sensitive     CIPROFLOXACIN <=0.25 SENSITIVE Sensitive     GENTAMICIN <=1 SENSITIVE Sensitive     IMIPENEM <=0.25 SENSITIVE Sensitive     NITROFURANTOIN 64 INTERMEDIATE Intermediate     TRIMETH/SULFA <=20 SENSITIVE Sensitive     AMPICILLIN/SULBACTAM 4 SENSITIVE Sensitive     PIP/TAZO <=4 SENSITIVE Sensitive     * 90,000 COLONIES/mL KLEBSIELLA PNEUMONIAE         Radiology Studies: ECHOCARDIOGRAM COMPLETE BUBBLE STUDY  Result Date: 12/08/2020    ECHOCARDIOGRAM REPORT   Patient Name:   [place] Date of Exam: 12/08/2020 Medical Rec #:  [id number]             Height:       62.0 in Accession #:    [id number]            Weight:       159.8 lb Date of Birth:  [birth year]             BSA:          1.738 m Patient Age:    50 years              BP:           186/72 mmHg Patient Gender: F                     HR:           64 bpm. Exam Location:  [facility] Procedure: 2D Echo, Saline Contrast Bubble Study and Strain Analysis Indications:     Stroke I63.9  History:         Patient has prior history of Echocardiogram examinations, most                  recent 08/08/2020.  Sonographer:     Kathlen Brunswick RDCS Referring Phys:  [id number] ERIC LINDZEN Diagnosing Phys: Kate Sable MD  Sonographer Comments: Global longitudinal strain was attempted. IMPRESSIONS  1. Left ventricular ejection  fraction, by estimation, is 55 to 60%. The left ventricle has normal function. The left ventricle has no regional wall motion abnormalities. Left ventricular diastolic parameters were normal. The average left ventricular global longitudinal strain is -19.6 %. The global longitudinal strain is normal.  2. Right ventricular systolic function is normal. The right ventricular size is normal.  3. The mitral valve is normal in structure. Trivial mitral valve regurgitation.  4. The aortic valve was not well visualized. Aortic valve regurgitation is not visualized. FINDINGS  Left Ventricle: Left ventricular ejection fraction, by estimation, is 55 to 60%. The left ventricle has normal function. The left ventricle has no regional wall motion abnormalities. The average left ventricular global longitudinal strain is -19.6 %. The global longitudinal strain is normal. The left ventricular internal cavity size was normal in size. There is no left ventricular hypertrophy. Left ventricular diastolic parameters were normal. Right Ventricle: The right ventricular size is normal. No increase in right ventricular wall thickness. Right ventricular systolic function is normal. Left Atrium: Left atrial size was normal in size. Right Atrium: Right atrial size was normal in size. Pericardium: There is no evidence of pericardial effusion. Mitral Valve: The mitral valve is normal in structure. Trivial mitral valve regurgitation. Tricuspid Valve: The tricuspid valve is normal in structure. Tricuspid valve regurgitation is not demonstrated. Aortic Valve: The aortic valve was not well visualized. Aortic valve regurgitation is not visualized.  CEFEPIME <=0.12 SENSITIVE Sensitive     CEFTRIAXONE <=0.25 SENSITIVE Sensitive     CIPROFLOXACIN <=0.25 SENSITIVE Sensitive     GENTAMICIN <=1 SENSITIVE Sensitive     IMIPENEM <=0.25 SENSITIVE Sensitive     NITROFURANTOIN 64 INTERMEDIATE Intermediate     TRIMETH/SULFA <=20 SENSITIVE Sensitive     AMPICILLIN/SULBACTAM 4 SENSITIVE Sensitive     PIP/TAZO <=4 SENSITIVE Sensitive     * 90,000 COLONIES/mL KLEBSIELLA PNEUMONIAE         Radiology Studies: ECHOCARDIOGRAM COMPLETE BUBBLE STUDY  Result Date: 12/08/2020    ECHOCARDIOGRAM REPORT   Patient Name:   [place] Date of Exam: 12/08/2020 Medical Rec #:  [id number]             Height:       62.0 in Accession #:    [id number]            Weight:       159.8 lb Date of Birth:  [birth year]             BSA:          1.738 m Patient Age:    50 years              BP:           186/72 mmHg Patient Gender: F                     HR:           64 bpm. Exam Location:  [facility] Procedure: 2D Echo, Saline Contrast Bubble Study and Strain Analysis Indications:     Stroke I63.9  History:         Patient has prior history of Echocardiogram examinations, most                  recent 08/08/2020.  Sonographer:     Kathlen Brunswick RDCS Referring Phys:  [id number] ERIC LINDZEN Diagnosing Phys: Kate Sable MD  Sonographer Comments: Global longitudinal strain was attempted. IMPRESSIONS  1. Left ventricular ejection  fraction, by estimation, is 55 to 60%. The left ventricle has normal function. The left ventricle has no regional wall motion abnormalities. Left ventricular diastolic parameters were normal. The average left ventricular global longitudinal strain is -19.6 %. The global longitudinal strain is normal.  2. Right ventricular systolic function is normal. The right ventricular size is normal.  3. The mitral valve is normal in structure. Trivial mitral valve regurgitation.  4. The aortic valve was not well visualized. Aortic valve regurgitation is not visualized. FINDINGS  Left Ventricle: Left ventricular ejection fraction, by estimation, is 55 to 60%. The left ventricle has normal function. The left ventricle has no regional wall motion abnormalities. The average left ventricular global longitudinal strain is -19.6 %. The global longitudinal strain is normal. The left ventricular internal cavity size was normal in size. There is no left ventricular hypertrophy. Left ventricular diastolic parameters were normal. Right Ventricle: The right ventricular size is normal. No increase in right ventricular wall thickness. Right ventricular systolic function is normal. Left Atrium: Left atrial size was normal in size. Right Atrium: Right atrial size was normal in size. Pericardium: There is no evidence of pericardial effusion. Mitral Valve: The mitral valve is normal in structure. Trivial mitral valve regurgitation. Tricuspid Valve: The tricuspid valve is normal in structure. Tricuspid valve regurgitation is not demonstrated. Aortic Valve: The aortic valve was not well visualized. Aortic valve regurgitation is not visualized.  CEFEPIME <=0.12 SENSITIVE Sensitive     CEFTRIAXONE <=0.25 SENSITIVE Sensitive     CIPROFLOXACIN <=0.25 SENSITIVE Sensitive     GENTAMICIN <=1 SENSITIVE Sensitive     IMIPENEM <=0.25 SENSITIVE Sensitive     NITROFURANTOIN 64 INTERMEDIATE Intermediate     TRIMETH/SULFA <=20 SENSITIVE Sensitive     AMPICILLIN/SULBACTAM 4 SENSITIVE Sensitive     PIP/TAZO <=4 SENSITIVE Sensitive     * 90,000 COLONIES/mL KLEBSIELLA PNEUMONIAE         Radiology Studies: ECHOCARDIOGRAM COMPLETE BUBBLE STUDY  Result Date: 12/08/2020    ECHOCARDIOGRAM REPORT   Patient Name:   [place] Date of Exam: 12/08/2020 Medical Rec #:  [id number]             Height:       62.0 in Accession #:    [id number]            Weight:       159.8 lb Date of Birth:  [birth year]             BSA:          1.738 m Patient Age:    50 years              BP:           186/72 mmHg Patient Gender: F                     HR:           64 bpm. Exam Location:  [facility] Procedure: 2D Echo, Saline Contrast Bubble Study and Strain Analysis Indications:     Stroke I63.9  History:         Patient has prior history of Echocardiogram examinations, most                  recent 08/08/2020.  Sonographer:     Kathlen Brunswick RDCS Referring Phys:  [id number] ERIC LINDZEN Diagnosing Phys: Kate Sable MD  Sonographer Comments: Global longitudinal strain was attempted. IMPRESSIONS  1. Left ventricular ejection  fraction, by estimation, is 55 to 60%. The left ventricle has normal function. The left ventricle has no regional wall motion abnormalities. Left ventricular diastolic parameters were normal. The average left ventricular global longitudinal strain is -19.6 %. The global longitudinal strain is normal.  2. Right ventricular systolic function is normal. The right ventricular size is normal.  3. The mitral valve is normal in structure. Trivial mitral valve regurgitation.  4. The aortic valve was not well visualized. Aortic valve regurgitation is not visualized. FINDINGS  Left Ventricle: Left ventricular ejection fraction, by estimation, is 55 to 60%. The left ventricle has normal function. The left ventricle has no regional wall motion abnormalities. The average left ventricular global longitudinal strain is -19.6 %. The global longitudinal strain is normal. The left ventricular internal cavity size was normal in size. There is no left ventricular hypertrophy. Left ventricular diastolic parameters were normal. Right Ventricle: The right ventricular size is normal. No increase in right ventricular wall thickness. Right ventricular systolic function is normal. Left Atrium: Left atrial size was normal in size. Right Atrium: Right atrial size was normal in size. Pericardium: There is no evidence of pericardial effusion. Mitral Valve: The mitral valve is normal in structure. Trivial mitral valve regurgitation. Tricuspid Valve: The tricuspid valve is normal in structure. Tricuspid valve regurgitation is not demonstrated. Aortic Valve: The aortic valve was not well visualized. Aortic valve regurgitation is not visualized.

## 2020-12-09 NOTE — Progress Notes (Signed)
Physical Therapy Treatment Patient Details Name: [name] MRN: [id number] DOB: [birth year] Today's Date: 12/09/2020   History of Present Illness [name] is a 50 y.o. female with medical history significant for coronary artery disease, CVA with R sided deficits, type 2 diabetes mellitus and hypertension, who presented to the emergency room with acute onset of altered mental status with increased slurred speech today and global generalized weakness. Head and neck CTA revealed no emergent large vessel occlusion.    PT Comments    Pt received supine in bed agreeable to PT. Pt remains supervision for all bed mobility and CGA for transfers and CGA to supervision for ambulation. Pt displays safe ability to utilize RW with 1 lap around nurses station only requiring supervision. Visual cuing given to pt with target to focus on in order to improve ability to amb in straight line to prevent L/R drift with amb. Attempts made to perform horizontal head turns while maintaining gait speed but pt does rely on stopping or slowing of gait to perform. Pt returned to room and trialed additional lap with no AD. Pt requires close CGA as pt does display very narrow BOS and increased bouts of L sway into wall with scissoring of gait thus requiring UE support on rail and stepping strategy to correct. X2 bouts of LLE buckling although minor does cause pt to lose balance relying on minA from PT to correct. Pt returned to room with trialing standing balance exercises. Most difficulty noted with challenge to vestibular system with removal of vision and narrowing BOS. Pt will continue to benefit from STR due to lack of caregiver support and continuous balance deficits. If pt declines, pt will benefit from [facility] PT for balance impairments to reduce risk of falls in home environment.    Recommendations for follow up therapy are one component of a multi-disciplinary discharge planning process, led by the attending  physician.  Recommendations may be updated based on patient status, additional functional criteria and insurance authorization.  Follow Up Recommendations  Skilled nursing-short term rehab (<3 hours/day)     Assistance Recommended at Discharge Frequent or constant Supervision/Assistance  Equipment Recommendations  None recommended by PT    Recommendations for Other Services       Precautions / Restrictions Precautions Precautions: Fall Restrictions Weight Bearing Restrictions: No     Mobility  Bed Mobility Overal bed mobility: Needs Assistance Bed Mobility: Supine to Sit;Sit to Supine     Supine to sit: Supervision;HOB elevated Sit to supine: Supervision     Patient Response: Cooperative  Transfers Overall transfer level: Needs assistance Equipment used: Rolling walker (2 wheels) Transfers: Sit to/from Stand Sit to Stand: Min guard                Ambulation/Gait Ambulation/Gait assistance: Min guard Gait Distance (Feet): 320 Feet (1 lap with RW, 1 lap with no AD) Assistive device: Rolling walker (2 wheels);None Gait Pattern/deviations: Step-through pattern;Decreased stride length;Narrow base of support;Trunk flexed;Drifts right/left       General Gait Details: Pt appears stable with RW only requiring supervision. With no AD pt sways to left and right intermittently with almost scissoring type gait requiring stepping strategy and rail in hallway to correct.   Stairs             Wheelchair Mobility    Modified Rankin (Stroke Patients Only)       Balance Overall balance assessment: Needs assistance Sitting-balance support: Feet supported;No upper extremity supported Sitting balance-Leahy  Scale: Normal     Standing balance support: No upper extremity supported;During functional activity Standing balance-Leahy Scale: Fair Standing balance comment: Able to stand with no AD, no LOB noted                            Cognition  Arousal/Alertness: Awake/alert Behavior During Therapy: WFL for tasks assessed/performed Overall Cognitive Status: Within Functional Limits for tasks assessed                                          Exercises Other Exercises Other Exercises: Feet together static stands, feet together eyes closed, modified tandem. 1x30 sec/LE    General Comments        Pertinent Vitals/Pain Pain Assessment: Faces Faces Pain Scale: Hurts a little bit Pain Location: R wrist Pain Descriptors / Indicators: Tender Pain Intervention(s): Limited activity within patient's tolerance;Monitored during session    Home Living                          Prior Function            PT Goals (current goals can now be found in the care plan section) Acute Rehab PT Goals Patient Stated Goal: to understand why her memory is decreased PT Goal Formulation: With patient Time For Goal Achievement: 12/20/20 Potential to Achieve Goals: Fair Progress towards PT goals: Progressing toward goals    Frequency    Min 2X/week      PT Plan Current plan remains appropriate    Co-evaluation              AM-PAC PT "6 Clicks" Mobility   Outcome Measure  Help needed turning from your back to your side while in a flat bed without using bedrails?: A Little Help needed moving from lying on your back to sitting on the side of a flat bed without using bedrails?: A Little Help needed moving to and from a bed to a chair (including a wheelchair)?: A Little Help needed standing up from a chair using your arms (e.g., wheelchair or bedside chair)?: A Little Help needed to walk in hospital room?: A Little Help needed climbing 3-5 steps with a railing? : A Lot 6 Click Score: 17    End of Session Equipment Utilized During Treatment: Gait belt Activity Tolerance: Patient tolerated treatment well Patient left: in bed;with call bell/phone within reach Nurse Communication: Mobility status PT  Visit Diagnosis: Unsteadiness on feet (R26.81);History of falling (Z91.81)     Time: 5400-8676 PT Time Calculation (min) (ACUTE ONLY): 24 min  Charges:  $Gait Training: 23-37 mins                     Salem Caster. Fairly IV, PT, DPT Physical Therapist- [facility]  12/09/2020, 11:13 AM

## 2020-12-09 NOTE — Telephone Encounter (Signed)
Will wait for pt to be discharged.

## 2020-12-09 NOTE — Progress Notes (Signed)
Met with patient today as a follow up. She seems to be far more engaging today and pleased with meeting with the [facility] and having the Psych evaluation. Patient is expecting discharge and believe she will do well with out patient care.

## 2020-12-09 NOTE — Plan of Care (Signed)
  Problem: Education: Goal: Knowledge of disease or condition will improve Outcome: Progressing Goal: Knowledge of secondary prevention will improve ([facility]) Outcome: Progressing   Problem: Coping: Goal: Will verbalize positive feelings about self Outcome: Progressing   Problem: Self-Care: Goal: Ability to participate in self-care as condition permits will improve Outcome: Progressing   

## 2020-12-10 ENCOUNTER — Telehealth: Payer: Self-pay | Admitting: Family Medicine

## 2020-12-10 ENCOUNTER — Inpatient Hospital Stay: Payer: Medicare Other

## 2020-12-10 LAB — GLUCOSE, CAPILLARY
Glucose-Capillary: 81 mg/dL (ref 70–99)
Glucose-Capillary: 129 mg/dL — ABNORMAL HIGH (ref 70–99)

## 2020-12-10 LAB — CBC
HCT: 35.6 % — ABNORMAL LOW (ref 36.0–46.0)
Hemoglobin: 11 g/dL — ABNORMAL LOW (ref 12.0–15.0)
MCH: 25.2 pg — ABNORMAL LOW (ref 26.0–34.0)
MCHC: 30.9 g/dL (ref 30.0–36.0)
MCV: 81.5 fL (ref 80.0–100.0)
Platelets: 137 10*3/uL — ABNORMAL LOW (ref 150–400)
RBC: 4.37 MIL/uL (ref 3.87–5.11)
RDW: 14.2 % (ref 11.5–15.5)
WBC: 6.1 10*3/uL (ref 4.0–10.5)
nRBC: 0 % (ref 0.0–0.2)

## 2020-12-10 LAB — BASIC METABOLIC PANEL
Anion gap: 8 (ref 5–15)
BUN: 45 mg/dL — ABNORMAL HIGH (ref 6–20)
CO2: 24 mmol/L (ref 22–32)
Calcium: 9 mg/dL (ref 8.9–10.3)
Chloride: 106 mmol/L (ref 98–111)
Creatinine, Ser: 1.84 mg/dL — ABNORMAL HIGH (ref 0.44–1.00)
GFR, Estimated: 33 mL/min — ABNORMAL LOW (ref >60)
Glucose, Bld: 97 mg/dL (ref 70–99)
Potassium: 4.5 mmol/L (ref 3.5–5.1)
Sodium: 138 mmol/L (ref 135–145)

## 2020-12-10 MED ORDER — PANTOPRAZOLE SODIUM 40 MG PO TBEC: 40.0000 mg | DELAYED_RELEASE_TABLET | Freq: Every day | ORAL | 1 refills | Status: DC

## 2020-12-10 MED ORDER — ASPIRIN 81 MG PO TBEC
81.0000 mg | DELAYED_RELEASE_TABLET | Freq: Every day | ORAL | 1 refills | Status: AC
Start: 2020-12-10 — End: 2022-02-09

## 2020-12-10 MED ORDER — CEFADROXIL 500 MG PO CAPS: 500.0000 mg | ORAL_CAPSULE | Freq: Two times a day (BID) | ORAL | 0 refills | Status: AC

## 2020-12-10 MED ORDER — AMLODIPINE BESYLATE 2.5 MG PO TABS: 5.0000 mg | ORAL_TABLET | Freq: Every day | ORAL | 0 refills | Status: DC

## 2020-12-10 MED ORDER — PANTOPRAZOLE SODIUM 40 MG PO TBEC: 40.0000 mg | DELAYED_RELEASE_TABLET | Freq: Two times a day (BID) | ORAL | 1 refills | Status: DC

## 2020-12-10 NOTE — TOC Progression Note (Signed)
Transition of Care [facility]) - Progression Note    Patient Details  Name: [name] MRN: [id number] Date of Birth: February 18, 1970  [facility] CM/SW [place], RN Phone Number: 12/10/2020, 8:47 AM  Clinical Narrative:   [facility] can accept patient for PT/OT, confirmed by Merleen Nicely.  Merleen Nicely does not have RN, but is looking into social work for this patient.  MD and patient aware of resources.TOC to follow       Barriers to Discharge: Continued Medical Work up  Expected Discharge Plan and Services     Discharge Planning Services: CM Consult Post Acute Care Choice: [place] arrangements for the past 2 months: Single Family Home                           HH Arranged: RN, PT, OT [facility] Agency:  (TBD, reached out to [employer], awaiting call back)         Social Determinants of Health (SDOH) Interventions    Readmission Risk Interventions Readmission Risk Prevention Plan 11/07/2019 10/18/2019 09/05/2019  Transportation Screening Complete Complete Complete  Medication Review Press photographer) Complete Complete Complete  PCP or Specialist appointment within 3-5 days of discharge (No Data) Complete Complete  HRI or Home Care Consult Complete Complete -  SW Recovery Care/Counseling Consult - Complete Complete  SW Consult Not Complete Comments - - -  Palliative Care Screening Not Applicable Not Applicable Not [place] Not Applicable Not Applicable Not Applicable  Some recent data might be hidden

## 2020-12-10 NOTE — Telephone Encounter (Signed)
Left message advising [name],   -Mickel Baas

## 2020-12-10 NOTE — Progress Notes (Signed)
Physical Therapy Treatment Patient Details Name: Haliegh Khurana MRN: [id number] DOB: 03/20/1970 Today's Date: 12/10/2020   History of Present Illness Chaneka Monserratt Knezevic is a 50 y.o. female with medical history significant for coronary artery disease, CVA with R sided deficits, type 2 diabetes mellitus and hypertension, who presented to the emergency room with acute onset of altered mental status with increased slurred speech today and global generalized weakness. Head and neck CTA revealed no emergent large vessel occlusion.    PT Comments    Pt received supine in bed agreeable to PT. Pt progressing well with amb with no AD. Able to perform 320' with no AD without LoB with intermittent, minor sway to L and need for LUE on hand rail for support. Tolerated horizontal head turns, increasing gait speed and cognitive dual tasking without LOB. Did required moderate amounts of cuing to maintain task especially with head turns, and limited acceleration noted when asked to increase speed of gait. Returned to room with SLS, side steps, and semi tandem stance with eyes closed to progress static/dynamic balance. Pt safe to return to home environment with RW as pt remains very stable and safe with AD. Will benefit from [facility] PT to progress dynamic balance and reduce risk of falls with LRAD.   Recommendations for follow up therapy are one component of a multi-disciplinary discharge planning process, led by the attending physician.  Recommendations may be updated based on patient status, additional functional criteria and insurance authorization.  Follow Up Recommendations  Home health PT     Assistance Recommended at Discharge PRN  Equipment Recommendations  None recommended by PT    Recommendations for Other Services       Precautions / Restrictions Precautions Precautions: Fall Restrictions Weight Bearing Restrictions: No     Mobility  Bed Mobility Overal bed mobility: Needs  Assistance Bed Mobility: Supine to Sit;Sit to Supine     Supine to sit: Supervision;HOB elevated Sit to supine: Supervision     Patient Response: Cooperative  Transfers Overall transfer level: Needs assistance Equipment used: Rolling walker (2 wheels) Transfers: Sit to/from Stand Sit to Stand: Supervision           General transfer comment: Able to stand at EOb without hands on assist. Intermittent UE placement on closet for standing.    Ambulation/Gait Ambulation/Gait assistance: Min guard Gait Distance (Feet): 320 Feet Assistive device: None Gait Pattern/deviations: Step-through pattern;Decreased stride length;Narrow base of support;Trunk flexed;Drifts right/left       General Gait Details: Progressed amb with no AD. slight L sway during gait but no scissoring gait today with LOB. Intermittent LUE on handrail throughout for steadiness.   Stairs             Wheelchair Mobility    Modified Rankin (Stroke Patients Only)       Balance Overall balance assessment: Needs assistance Sitting-balance support: Feet supported;No upper extremity supported Sitting balance-Leahy Scale: Normal     Standing balance support: No upper extremity supported;During functional activity Standing balance-Leahy Scale: Poor Standing balance comment: Able to stand with no AD, no LOB noted             High level balance activites: Side stepping;Head turns High Level Balance Comments: Performed R/L side steps x5 in room per direction. Head turns horizontal performed (x5/direction). Dual task naming animals during gait            Cognition Arousal/Alertness: Awake/alert Behavior During Therapy: WFL for tasks assessed/performed Overall Cognitive Status:  Within Functional Limits for tasks assessed                                          Exercises Other Exercises Other Exercises: SLS with finger tip suport, 15 sec/LE, semi tandem 2x15 sec/Le under  BOS    General Comments        Pertinent Vitals/Pain Pain Assessment: No/denies pain    Home Living                          Prior Function            PT Goals (current goals can now be found in the care plan section) Acute Rehab PT Goals Patient Stated Goal: to understand why her memory is decreased PT Goal Formulation: With patient Time For Goal Achievement: 12/20/20 Potential to Achieve Goals: Fair Progress towards PT goals: Progressing toward goals    Frequency    Min 2X/week      PT Plan Discharge plan needs to be updated    Co-evaluation              AM-PAC PT "6 Clicks" Mobility   Outcome Measure  Help needed turning from your back to your side while in a flat bed without using bedrails?: A Little Help needed moving from lying on your back to sitting on the side of a flat bed without using bedrails?: A Little Help needed moving to and from a bed to a chair (including a wheelchair)?: A Little Help needed standing up from a chair using your arms (e.g., wheelchair or bedside chair)?: A Little Help needed to walk in hospital room?: A Little Help needed climbing 3-5 steps with a railing? : A Lot 6 Click Score: 17    End of Session Equipment Utilized During Treatment: Gait belt Activity Tolerance: Patient tolerated treatment well Patient left: in bed;with call bell/phone within reach Nurse Communication: Mobility status PT Visit Diagnosis: Unsteadiness on feet (R26.81);History of falling (Z91.81)     Time: 8563-1497 PT Time Calculation (min) (ACUTE ONLY): 11 min  Charges:  $Neuromuscular Re-education: 8-22 mins                    Salem Caster. Fairly IV, PT, DPT Physical Therapist- [facility]  12/10/2020, 3:55 PM

## 2020-12-10 NOTE — Telephone Encounter (Signed)
That's fine. Thanks!

## 2020-12-10 NOTE — Discharge Summary (Signed)
Physician Discharge Summary  Earnestine Ciano [place] WNU:[id number] DOB: [birth year] DOA: 12/05/2020  PCP: [name], MD  Admit date: 12/05/2020 Discharge date: 12/10/2020  Time spent: 40 minutes  Recommendations for Outpatient Follow-up:  Follow outpatient CBC/CMP Follow pending hypercoagulable panel Continue blood pressure control outpatient Needs prompt GI follow up for  CT abd/pelvis showing 1.3 cm hyperdense focus in proximal duodenum as well as reported history of bleeding noted about 2 weeks prior to this hospitalization (no bleeding noted during this hospitalization and stable Hb at time of discharge) Follow with neurology outpatient, she wants to establish with [place] neurologist  Discharged on DAPT and PPI BID  Discharge Diagnoses:  Principal Problem:   Major depressive disorder, recurrent episode, moderate (HCC) Active Problems:   Altered mental status   Discharge Condition: stable  Diet recommendation: heart healthy  Filed Weights   12/05/20 1837  Weight: 72.5 kg    History of present illness:  50 year old female with past medical history significant for coronary disease, type 2 diabetes mellitus, hypertension, who presented to the emergency department with acute onset of altered mental status with increased slurred speech and global generalized weakness.  She's been treated for Brihana Quickel UTI, hypertensive emergency, and CVA.  Therapy recommending SNF, but she declines and wants to discharge home.   See below for additional details  Hospital Course:  Acute Metabolic Encephalopathy Suspected 2/2 hypertensive encephalopathy.  Stroke and UTI could be contributing. Seems improved at this point Treatment as below Memantine d/c'd per neurology, associated with worsening mentation   Acute Ischemia within Right Middle Cerebellar Peduncle MRI with stroke Neurology c/s,              - recommending BP management, goal 120-140 - needs improved long term BP              - d/c  memantine              - outpatient neurology follow up for possible vascular dementia and stroke             - echo with normal EF, 60-65%, grade 1 diastolic dysfunction - echo with bubble study without atrial level shunt detected by color flow doppler             - CTA head/neck without LVO, moderate narrowing of R vertebral artery V4 segment             - tele             - PT/OT/SLP recommending SNF - she wants to go home with [facility]             - continue crestor, recommending continue plavix - consider adding ASA if indicated given hx CAD s/p DES              - LDL 124, A1c wnl             - hypercoagulable panel pending at time of discharge, antithrombin III wnl   Klebsiella UTI Transition to cefadroxil from ceftriaxone   Hypertensive Emergency Thought to contribute to encephalopathy BP improved Goal 120-140 per neuro Continue amlodipine 5, coreg 12.5 BID, lasx 20 (prn), imdur - will adjust as needed Needs close outpatient follow up   T2DM A1c wnl, appears diet controlled now   Hypothyroidism Synthroid   CAD s/p DES Saw cardiology in 11/2020, at that time, recommended for her to continue aspirin, plavix, crestor, and imdur Continue DAPT   1.3 CM hyperdense focus seen  within proximal duodenum in the area of prior hemorrhage Discussed with GI over phone and they noted ok for outpatient follow up (with return precautions and plan for outpatient follow up in setting of this hyperdense focus and rectal bleeding noted Audia Amick few weeks ago)   Hx GI bleed 2/2 Gastric Ulcer S/p EGD with ulceration at GJ anastomosis, treated 03/2020 Start protonix with plan for DAPT Discussed importance of outpatient follow up (she notes rectal bleeding 2 weeks ago, but hb stable here and brown stool - discussed importance of close GI follow up and return precautions)  Procedures: IMPRESSIONS     1. Left ventricular ejection fraction, by estimation, is 55 to 60%. The  left ventricle has normal function.  The left ventricle has no regional  wall motion abnormalities. Left ventricular diastolic parameters were  normal. The average left ventricular  global longitudinal strain is -19.6 %. The global longitudinal strain is  normal.   2. Right ventricular systolic function is normal. The right ventricular  size is normal.   3. The mitral valve is normal in structure. Trivial mitral valve  regurgitation.   4. The aortic valve was not well visualized. Aortic valve regurgitation  is not visualized.   Consultations: Neurology GI   Discharge Exam: Vitals:   12/10/20 0746 12/10/20 1153  BP: (!) 153/80 111/85  Pulse: 61 77  Resp: 14 14  Temp: 98 F (36.7 C) 97.6 F (36.4 C)  SpO2: 98% 98%   No new complaints Comfortable with discharge  General: No acute distress. Cardiovascular: RRR Lungs: unlabored Abdomen: Soft, nontender, nondistended  Neurological: Alert and oriented 3. Moves all extremities 4. Cranial nerves II through XII grossly intact. Skin: Warm and dry. No rashes or lesions. Extremities: No clubbing or cyanosis. No edema.   Discharge Instructions   Discharge Instructions     Ambulatory referral to Neurology   Complete by: As directed    An appointment is requested in approximately: 4 weeks   Call MD for:  difficulty breathing, headache or visual disturbances   Complete by: As directed    Call MD for:  extreme fatigue   Complete by: As directed    Call MD for:  hives   Complete by: As directed    Call MD for:  persistant dizziness or light-headedness   Complete by: As directed    Call MD for:  persistant nausea and vomiting   Complete by: As directed    Call MD for:  redness, tenderness, or signs of infection (pain, swelling, redness, odor or green/yellow discharge around incision site)   Complete by: As directed    Call MD for:  severe uncontrolled pain   Complete by: As directed    Call MD for:  temperature >100.4   Complete by: As directed    Diet - low  sodium heart healthy   Complete by: As directed    Diet - low sodium heart healthy   Complete by: As directed    Discharge instructions   Complete by: As directed    You were seen for confusion.  This was in the setting of Zackaria Burkey UTI and Elleigh Cassetta stroke.   We've treated your UTI and will discharge you with another 3 days of antibiotics.  For your stroke, you were seen by neurology.  You strokes were thought to be related to chronic hypertension and possible vasospasm from high blood pressure.  Your confusion was thought to be related to severe high blood pressure as well.  They  recommended blood pressure control as well as discontinuing your memantine.  We ordered Jeno Calleros hypercoagulable panel for you (this tests for an increased risk of clotting).  The results are pending.  Please ask your new neurologist and your PCP to follow up the results of these tests.  Follow up with your outpatient doctor closely to ensure your blood pressure is well controlled.  I'll refer you to Eleisha Branscomb neurologist in [place], per your request.  Your CT scan of your abdomen showed Afnan Emberton 1.3 cm "hyperdense focus" in the area of your prior bleed.  Since you're not having any bleeding at this time, I think it's ok that this is followed up outpatient with the gastroenterologist, but it's important that you arrange follow up with Dr. Servando Snare to discuss next steps regarding this.    You told me you did have bleeding Edilia Ghuman couple of weeks ago, this makes GI follow up with Dr. Servando Snare even more important.  Watch for signs of bleeding on the aspirin and plavix.  Call and seek care immediately if you have evidence of bleeding.  You're on aspirin and plavix which place you at higher risk of bleeding.  We've started you on protonix 40 mg twice daily to protect your stomach.  Avoid additional NSAIDs (like ibuprofen, advil, naproxen) and OTC pain meds like goody powders, etc.  Discuss ALL meds with your PCP prior to using them.  Return for new, recurrent, or  worsening symptoms.  Please ask your PCP to request records from this hospitalization so they know what was done and what the next steps will be.   Increase activity slowly   Complete by: As directed    Increase activity slowly   Complete by: As directed       Allergies as of 12/10/2020       Reactions   Lipitor [atorvastatin] Other (See Comments)   Leg pains        Medication List     STOP taking these medications    memantine 5 MG tablet Commonly known as: NAMENDA   traMADol 50 MG tablet Commonly known as: ULTRAM   Ubrelvy 100 MG Tabs Generic drug: Ubrogepant       TAKE these medications    Aimovig 140 MG/ML Soaj Generic drug: Erenumab-aooe Inject into the skin.   ALPRAZolam 1 MG tablet Commonly known as: XANAX Take 1 tablet by mouth twice daily   amLODipine 2.5 MG tablet Commonly known as: NORVASC Take 2 tablets (5 mg total) by mouth daily. What changed: how much to take   aspirin 81 MG EC tablet Take 1 tablet (81 mg total) by mouth daily. Swallow whole.   Baclofen 5 MG Tabs Take 1 tablet by mouth 2 (two) times daily as needed.   Baqsimi One Pack 3 MG/DOSE Powd Generic drug: Glucagon Place 1 spray into both nostrils as directed.   buPROPion ER 100 MG 12 hr tablet Commonly known as: WELLBUTRIN SR Take 100 mg by mouth 2 (two) times daily.   carvedilol 12.5 MG tablet Commonly known as: COREG Take 1 tablet by mouth twice daily   cefadroxil 500 MG capsule Commonly known as: DURICEF Take 1 capsule (500 mg total) by mouth 2 (two) times daily for 2 days.   clopidogrel 75 MG tablet Commonly known as: PLAVIX Take 1 tablet (75 mg total) by mouth daily.   desvenlafaxine 50 MG 24 hr tablet Commonly known as: PRISTIQ Take 50 mg by mouth daily.   Desvenlafaxine ER 50 MG Tb24 Commonly  known as: PRISTIQ Take 1 tablet (50 mg total) by mouth every morning.   diclofenac Sodium 1 % Gel Commonly known as: VOLTAREN Apply 2 g topically 4 (four) times  daily.   diphenoxylate-atropine 2.5-0.025 MG tablet Commonly known as: Lomotil Take 1 tablet by mouth 4 (four) times daily as needed for diarrhea or loose stools.   estradiol 0.1 MG/GM vaginal cream Commonly known as: ESTRACE Estrogen Cream Instruction Discard applicator Apply pea sized amount to tip of finger to urethra before bed. Wash hands well after application. Use Monday, Wednesday and Friday   furosemide 20 MG tablet Commonly known as: LASIX Take 1 tablet (20 mg total) by mouth as needed (As needed for swelling).   gabapentin 300 MG capsule Commonly known as: NEURONTIN Take 2 capsules (600 mg total) by mouth 2 (two) times daily.   glucose 4 GM chewable tablet Chew 1 tablet (4 g total) by mouth 3 (three) times daily.   isosorbide mononitrate 120 MG 24 hr tablet Commonly known as: IMDUR Take 1 tablet by mouth every day   levothyroxine 25 MCG tablet Commonly known as: SYNTHROID Take 1 tablet by mouth 30 minutes before breakfast   nitroGLYCERIN 0.4 MG SL tablet Commonly known as: NITROSTAT Place 1 tablet (0.4 mg total) under the tongue every 5 (five) minutes as needed for chest pain. Do not exceed 3 dose within 30 mins   pantoprazole 40 MG tablet Commonly known as: PROTONIX Take 1 tablet (40 mg total) by mouth 2 (two) times daily.   promethazine 25 MG tablet Commonly known as: PHENERGAN Take 1 tablet (25 mg total) by mouth every 6 (six) hours as needed for nausea or vomiting.   rosuvastatin 40 MG tablet Commonly known as: CRESTOR Take 1 tablet (40 mg total) by mouth daily at 6 PM.   SYSTANE OP Place 1 drop into both eyes daily as needed (dry eyes).   traZODone 100 MG tablet Commonly known as: DESYREL Take 1 to 2 tablets by mouth at bedtime       Allergies  Allergen Reactions   Lipitor [Atorvastatin] Other (See Comments)    Leg pains    Follow-up Information     Midge Minium, MD Follow up.   Specialty: Gastroenterology Why: Call for Alahni Varone follow up  appointment Contact information: [address] [place]  [place] [address] [phone]         [name], MD Follow up.   Specialty: Family Medicine Contact information: [address] [place] [address] [place] [address] [phone]         Antonieta Iba, MD .   Specialty: Cardiology Contact information: [address] [place] [address] [phone]         [facility] Follow up.   Specialty: Neurology Why: Follow up with neurology regarding your stroke Contact information: [address] [place] [address] [phone]                 The results of significant diagnostics from this hospitalization (including imaging, microbiology, ancillary and laboratory) are listed below for reference.    Significant Diagnostic Studies: CT ANGIO HEAD NECK W WO CM  Result Date: 12/05/2020 CLINICAL DATA:  Confusion EXAM: CT ANGIOGRAPHY HEAD AND NECK TECHNIQUE: Multidetector CT imaging of the head and neck was performed using the standard protocol during bolus administration of intravenous contrast. Multiplanar CT image reconstructions and MIPs were obtained to evaluate the vascular anatomy. Carotid stenosis measurements (when applicable) are  obtained utilizing NASCET criteria, using the distal internal carotid diameter as the denominator. CONTRAST:  60mL OMNIPAQUE IOHEXOL 350 MG/ML SOLN COMPARISON:  None. FINDINGS: CTA NECK FINDINGS SKELETON: There is no bony spinal canal stenosis. No lytic or blastic lesion. OTHER NECK: Normal pharynx, larynx and major salivary glands. No cervical lymphadenopathy. Unremarkable thyroid gland. UPPER CHEST: No pneumothorax or pleural effusion. No nodules or masses. AORTIC ARCH: Right-sided aortic arch. RIGHT CAROTID SYSTEM: No dissection, occlusion or aneurysm. Mild atherosclerotic calcification at the carotid bifurcation without hemodynamically significant stenosis. LEFT CAROTID  SYSTEM: Normal without aneurysm, dissection or stenosis. VERTEBRAL ARTERIES: Right dominant configuration. Both origins are clearly patent. Diffusely diminutive left vertebral artery. CTA HEAD FINDINGS POSTERIOR CIRCULATION: --Vertebral arteries: Atherosclerotic calcification of the right V4 segment with moderate narrowing. --Inferior cerebellar arteries: Normal. --Basilar artery: Normal. --Superior cerebellar arteries: Normal. --Posterior cerebral arteries (PCA): Normal. ANTERIOR CIRCULATION: --Intracranial internal carotid arteries: Atherosclerotic calcification of the internal carotid arteries at the skull base without hemodynamically significant stenosis. --Anterior cerebral arteries (ACA): Normal. Both A1 segments are present. Patent anterior communicating artery (Zareen Jamison-comm). --Middle cerebral arteries (MCA): Normal. VENOUS SINUSES: As permitted by contrast timing, patent. ANATOMIC VARIANTS: None Review of the MIP images confirms the above findings. IMPRESSION: 1. No emergent large vessel occlusion. 2. Moderate narrowing of the right vertebral artery V4 segment secondary to atherosclerotic calcification. 3. Right-sided aortic arch. Electronically Signed   By: Deatra Robinson M.D.   On: 12/05/2020 22:01   CT HEAD WO CONTRAST  Result Date: 12/05/2020 CLINICAL DATA:  Altered mental status. EXAM: CT HEAD WITHOUT CONTRAST TECHNIQUE: Contiguous axial images were obtained from the base of the skull through the vertex without intravenous contrast. COMPARISON:  August 07, 2020 FINDINGS: Brain: There is mild cerebral atrophy with widening of the extra-axial spaces and ventricular dilatation. There are areas of decreased attenuation within the white matter tracts of the supratentorial brain, consistent with microvascular disease changes. Small chronic bilateral basal ganglia lacunar infarcts are seen. Vascular: No hyperdense vessel or unexpected calcification. Skull: Normal. Negative for fracture or focal lesion.  Sinuses/Orbits: No acute finding. Other: None. IMPRESSION: 1. Generalized cerebral atrophy. 2. Small chronic bilateral basal ganglia lacunar infarcts. 3. No acute intracranial abnormality. Electronically Signed   By: Aram Candela M.D.   On: 12/05/2020 19:24   CT Cervical Spine Wo Contrast  Result Date: 12/05/2020 CLINICAL DATA:  Neck trauma, dangerous injury mechanism (Age 59-64y) EXAM: CT CERVICAL SPINE WITHOUT CONTRAST TECHNIQUE: Multidetector CT imaging of the cervical spine was performed without intravenous contrast. Multiplanar CT image reconstructions were also generated. COMPARISON:  10/12/2018 FINDINGS: Alignment: Normal Skull base and vertebrae: No acute fracture. No primary bone lesion or focal pathologic process. Soft tissues and spinal canal: No prevertebral fluid or swelling. No visible canal hematoma. Disc levels: Large flowing anterior osteophytes. Disc spaces maintained. Upper chest: No acute findings Other: None IMPRESSION: Degenerative spurring anteriorly. No acute bony abnormality. Electronically Signed   By: Charlett Nose M.D.   On: 12/05/2020 21:33   MR BRAIN WO CONTRAST  Result Date: 12/05/2020 CLINICAL DATA:  Encephalopathy and weakness EXAM: MRI HEAD WITHOUT CONTRAST TECHNIQUE: Multiplanar, multiecho pulse sequences of the brain and surrounding structures were obtained without intravenous contrast. COMPARISON:  08/07/2020 FINDINGS: Brain: Small focus of abnormal diffusion restriction within the right middle cerebellar peduncle. No other diffusion abnormality. There are foci of chronic microhemorrhage within the brainstem, left frontal lobe and right temporal lobe. Hyperintense T2-weighted signal is moderately widespread throughout the white matter. Generalized volume loss without  Wilburt Messina clear lobar predilection. The midline structures are normal. Vascular: Major flow voids are preserved. Skull and upper cervical spine: Normal calvarium and skull base. Visualized upper cervical spine  and soft tissues are normal. Sinuses/Orbits:No paranasal sinus fluid levels or advanced mucosal thickening. No mastoid or middle ear effusion. Normal orbits. IMPRESSION: 1. Small focus of acute ischemia within the right middle cerebellar peduncle. No hemorrhage or mass effect. 2. Moderate chronic small vessel ischemic disease and Cerebral Atrophy (ICD10-G31.9). Electronically Signed   By: Deatra Robinson M.D.   On: 12/05/2020 23:26   CT ABDOMEN PELVIS W CONTRAST  Result Date: 12/05/2020 CLINICAL DATA:  Abdominal distension EXAM: CT ABDOMEN AND PELVIS WITH CONTRAST TECHNIQUE: Multidetector CT imaging of the abdomen and pelvis was performed using the standard protocol following bolus administration of intravenous contrast. CONTRAST:  60mL OMNIPAQUE IOHEXOL 350 MG/ML SOLN COMPARISON:  None. FINDINGS: Lower chest: No acute abnormality.  Coronary artery calcifications. Hepatobiliary: No focal liver abnormality is seen. Status post cholecystectomy. No biliary dilatation. Pancreas: No focal abnormality or ductal dilatation. Spleen: No focal abnormality.  Normal size. Adrenals/Urinary Tract: Scarring and cortical thinning in the kidneys bilaterally. No hydronephrosis. No renal or adrenal mass. Urinary bladder unremarkable. 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 Hyperdense area noted in the proximal duodenum in the area of prior hemorrhage measuring 1.3 cm. Cannot exclude duodenal lesion. No bowel obstruction. Vascular/Lymphatic: No evidence of aneurysm or adenopathy. Reproductive: Uterus and adnexa unremarkable.  No mass. Other: No free fluid or free air. Musculoskeletal: No acute bony abnormality. IMPRESSION: 1.3 cm hyperdense focus seen within the proximal duodenum in the area of prior hemorrhage. Cannot exclude duodenal wall lesion/mass. Consider GI consultation. No acute findings in the abdomen or pelvis. Electronically Signed   By: Charlett Nose M.D.   On: 12/05/2020 21:37   MR  WRIST RIGHT WO CONTRAST  Result Date: 11/25/2020 CLINICAL DATA:  Fall, prior fracture. EXAM: MR OF THE RIGHT WRIST WITHOUT CONTRAST TECHNIQUE: Multiplanar, multisequence MR imaging of the right wrist was performed. No intravenous contrast was administered. COMPARISON:  Wrist radiograph 06/09/2020 FINDINGS: Ligaments: Scapholunate and lunotriquetral ligaments are intact. Triangular fibrocartilage: TFCC degeneration with evidence of undersurface tearing (coronal T2/PD image 6). The foveal and styloid attachments on the ulna are visible and intact but demonstrate some intermediate signal and irregularity. Tendons: Mild focal tenosynovitis of the second extensor compartment tendons at the level of Lister's tubercle. There is edema signal within the ulnar groove which is likely reactive. The ECU tendon is intact without significant tenosynovitis. The other extensor tendons are unremarkable. Flexor tendons are unremarkable. Carpal tunnel/median nerve: Flexor retinaculum is intact. Normal carpal tunnel without Deziya Amero mass. Median nerve demonstrates normal signal and caliber. Guyon's canal: Normal Guyon's canal. Normal ulnar nerve. Joint/cartilage: There is mild radiocarpal and intercarpal arthritis. Prominent DRUJ effusion with additional more mild effusion of the radiocarpal and intercarpal joints. Bones/carpal alignment: There is extensive bony edema within the distal ulna, without discrete fracture line. There is subchondral marrow edema in the distal radius and carpus compatible with marrow change from arthritis. Likely unfused hook of hamate ossicle versus chronic nonunion of Thi Klich nondisplaced fracture. Other: Muscles are normal. No fluid collection, hematoma, or soft tissue mass. IMPRESSION: Evidence of ulnar-sided wrist injury. Bony edema within the distal ulna at the DRUJ, without discrete fracture, compatible with contusion. TFCC disc degenerative change with evidence of small focal undersurface tear/central  perforation, and possible prior injury involving the foveal and styloid attachments, though these appear intact.  Adjacent prominent DRUJ effusion and reactive edema signal within the ulnar groove. Intact ECU tendon without significant tenosynovitis. Mild radiocarpal and intercarpal arthritis with subchondral marrow edema, cystic change, and with mild effusion. Mild focal tenosynovitis of the second extensor compartment tendons at the level of Lister's tubercle. Electronically Signed   By: Caprice Renshaw M.D.   On: 11/25/2020 09:49   ECHOCARDIOGRAM COMPLETE BUBBLE STUDY  Result Date: 12/08/2020    ECHOCARDIOGRAM REPORT   Patient Name:   Suann MARIE [facility] Date of Exam: 12/08/2020 Medical Rec #:  [id number]             Height:       62.0 in Accession #:    [id number]            Weight:       159.8 lb Date of Birth:  [birth year]             BSA:          1.738 m Patient Age:    50 years              BP:           186/72 mmHg Patient Gender: F                     HR:           64 bpm. Exam Location:  [facility] Procedure: 2D Echo, Saline Contrast Bubble Study and Strain Analysis Indications:     Stroke I63.9  History:         Patient has prior history of Echocardiogram examinations, most                  recent 08/08/2020.  Sonographer:     Overton Mam RDCS Referring Phys:  [id number] ERIC LINDZEN Diagnosing Phys: Debbe Odea MD  Sonographer Comments: Global longitudinal strain was attempted. IMPRESSIONS  1. Left ventricular ejection fraction, by estimation, is 55 to 60%. The left ventricle has normal function. The left ventricle has no regional wall motion abnormalities. Left ventricular diastolic parameters were normal. The average left ventricular global longitudinal strain is -19.6 %. The global longitudinal strain is normal.  2. Right ventricular systolic function is normal. The right ventricular size is normal.  3. The mitral valve is normal in structure. Trivial mitral valve regurgitation.  4. The aortic valve was  not well visualized. Aortic valve regurgitation is not visualized. FINDINGS  Left Ventricle: Left ventricular ejection fraction, by estimation, is 55 to 60%. The left ventricle has normal function. The left ventricle has no regional wall motion abnormalities. The average left ventricular global longitudinal strain is -19.6 %. The global longitudinal strain is normal. The left ventricular internal cavity size was normal in size. There is no left ventricular hypertrophy. Left ventricular diastolic parameters were normal. Right Ventricle: The right ventricular size is normal. No increase in right ventricular wall thickness. Right ventricular systolic function is normal. Left Atrium: Left atrial size was normal in size. Right Atrium: Right atrial size was normal in size. Pericardium: There is no evidence of pericardial effusion. Mitral Valve: The mitral valve is normal in structure. Trivial mitral valve regurgitation. Tricuspid Valve: The tricuspid valve is normal in structure. Tricuspid valve regurgitation is not demonstrated. Aortic Valve: The aortic valve was not well visualized. Aortic valve regurgitation is not visualized. Aortic valve peak gradient measures 9.4 mmHg. Pulmonic Valve: The pulmonic valve was not well visualized. Pulmonic valve regurgitation is not visualized. Aorta:  The aortic root and ascending aorta are structurally normal, with no evidence of dilitation. Venous: The inferior vena cava was not well visualized. IAS/Shunts: No atrial level shunt detected by color flow Doppler. Agitated saline contrast was given intravenously to evaluate for intracardiac shunting.  LEFT VENTRICLE PLAX 2D LVIDd:         4.95 cm   Diastology LVIDs:         3.53 cm   LV e' medial:    6.85 cm/s LV PW:         1.04 cm   LV E/e' medial:  12.1 LV IVS:        0.94 cm   LV e' lateral:   10.30 cm/s LVOT diam:     2.00 cm   LV E/e' lateral: 8.1 LV SV:         48 LV SV Index:   28        2D Longitudinal Strain LVOT Area:     3.14  cm  2D Strain GLS Avg:     -19.6 %  RIGHT VENTRICLE RV Basal diam:  2.08 cm RV S prime:     12.30 cm/s TAPSE (M-mode): 2.2 cm LEFT ATRIUM             Index        RIGHT ATRIUM          Index LA diam:        3.70 cm 2.13 cm/m   RA Area:     9.54 cm LA Vol (A2C):   41.4 ml 23.82 ml/m  RA Volume:   19.10 ml 10.99 ml/m LA Vol (A4C):   42.8 ml 24.63 ml/m LA Biplane Vol: 45.3 ml 26.07 ml/m  AORTIC VALVE                 PULMONIC VALVE AV Area (Vmax): 1.54 cm     PV Vmax:       1.06 m/s AV Vmax:        153.00 cm/s  PV Peak grad:  4.5 mmHg AV Peak Grad:   9.4 mmHg LVOT Vmax:      75.10 cm/s LVOT Vmean:     45.900 cm/s LVOT VTI:       0.153 m  AORTA Ao Root diam: 2.80 cm Ao Asc diam:  2.80 cm MITRAL VALVE MV Area (PHT): 2.81 cm    SHUNTS MV Decel Time: 270 msec    Systemic VTI:  0.15 m MV E velocity: 83.10 cm/s  Systemic Diam: 2.00 cm MV Johnn Krasowski velocity: 74.10 cm/s MV E/[place] ratio:  1.12 Debbe Odea MD Electronically signed by Debbe Odea MD Signature Date/Time: 12/08/2020/1:50:47 PM    Final    DG HIP UNILAT WITH PELVIS 2-3 VIEWS LEFT  Result Date: 12/08/2020 CLINICAL DATA:  Pain.  Recent fall. EXAM: DG HIP (WITH OR WITHOUT PELVIS) 2-3V LEFT COMPARISON:  Abdominopelvic CT 12/05/2020 FINDINGS: Intramedullary nail with trans trochanteric screw fixation of the proximal left femur. The hardware is intact were visualized. There is no periprosthetic lucency or fracture. Intact pubic rami. Pubic symphysis and sacroiliac joints are congruent. The bones are under mineralized. There is mild bilateral hip osteoarthritis with acetabular spurring. Age advanced vascular calcifications. IMPRESSION: 1. No acute fracture of the pelvis or hip. 2. Postsurgical change of the left femur without hardware complication. Electronically Signed   By: Narda Rutherford M.D.   On: 12/08/2020 17:10    Microbiology: Recent Results (from the past 240 hour(s))  Resp  Panel by RT-PCR (Flu Layna Roeper&B, Covid) Nasopharyngeal Swab     Status: None    Collection Time: 12/05/20  8:26 PM   Specimen: Nasopharyngeal Swab; Nasopharyngeal(NP) swabs in vial transport medium  Result Value Ref Range Status   SARS Coronavirus 2 by RT PCR NEGATIVE NEGATIVE Final    Comment: (NOTE) SARS-CoV-2 target nucleic acids are NOT DETECTED.  The SARS-CoV-2 RNA is generally detectable in upper respiratory specimens during the acute phase of infection. The lowest concentration of SARS-CoV-2 viral copies this assay can detect is 138 copies/mL. Jonet Mathies negative result does not preclude SARS-Cov-2 infection and should not be used as the sole basis for treatment or other patient management decisions. Quinn Bartling negative result may occur with  improper specimen collection/handling, submission of specimen other than nasopharyngeal swab, presence of viral mutation(s) within the areas targeted by this assay, and inadequate number of viral copies(<138 copies/mL). Whitlee Sluder negative result must be combined with clinical observations, patient history, and epidemiological information. The expected result is Negative.  Fact Sheet for Patients:  BloggerCourse.com  Fact Sheet for Healthcare Providers:  SeriousBroker.it  This test is no t yet approved or cleared by the [place] FDA and  has been authorized for detection and/or diagnosis of SARS-CoV-2 by FDA under an Emergency Use Authorization (EUA). This EUA will remain  in effect (meaning this test can be used) for the duration of the COVID-19 declaration under Section 564(b)(1) of the Act, 21 U.S.C.section 360bbb-3(b)(1), unless the authorization is terminated  or revoked sooner.       Influenza Kellar Westberg by PCR NEGATIVE NEGATIVE Final   Influenza B by PCR NEGATIVE NEGATIVE Final    Comment: (NOTE) The Xpert Xpress SARS-CoV-2/FLU/RSV plus assay is intended as an aid in the diagnosis of influenza from Nasopharyngeal swab specimens and should not be used as Cayle Cordoba sole basis for treatment.  Nasal washings and aspirates are unacceptable for Xpert Xpress SARS-CoV-2/FLU/RSV testing.  Fact Sheet for Patients: BloggerCourse.com  Fact Sheet for Healthcare Providers: SeriousBroker.it  This test is not yet approved or cleared by the [place] FDA and has been authorized for detection and/or diagnosis of SARS-CoV-2 by FDA under an Emergency Use Authorization (EUA). This EUA will remain in effect (meaning this test can be used) for the duration of the COVID-19 declaration under Section 564(b)(1) of the Act, 21 U.S.C. section 360bbb-3(b)(1), unless the authorization is terminated or revoked.  Performed at [facility], [address], [place], [place] [address]   Urine Culture     Status: Abnormal   Collection Time: 12/05/20  9:42 PM   Specimen: Urine, Clean Catch  Result Value Ref Range Status   Specimen Description   Final    URINE, CLEAN CATCH Performed at [facility], [address] [place], [place] [address]    Special Requests   Final    NONE Performed at [facility], [address] [place], [place] [address]    Culture 90,000 COLONIES/mL KLEBSIELLA PNEUMONIAE (Roena Sassaman)  Final   Report Status 12/07/2020 FINAL  Final   Organism ID, Bacteria KLEBSIELLA PNEUMONIAE (Macyn Remmert)  Final      Susceptibility   Klebsiella pneumoniae - MIC*    AMPICILLIN RESISTANT Resistant     CEFAZOLIN <=4 SENSITIVE Sensitive     CEFEPIME <=0.12 SENSITIVE Sensitive     CEFTRIAXONE <=0.25 SENSITIVE Sensitive     CIPROFLOXACIN <=0.25 SENSITIVE Sensitive     GENTAMICIN <=1 SENSITIVE Sensitive     IMIPENEM <=0.25 SENSITIVE Sensitive  NITROFURANTOIN 64 INTERMEDIATE Intermediate     TRIMETH/SULFA <=20 SENSITIVE Sensitive     AMPICILLIN/SULBACTAM 4 SENSITIVE Sensitive     PIP/TAZO <=4 SENSITIVE Sensitive     * 90,000 COLONIES/mL KLEBSIELLA PNEUMONIAE     Labs: Basic Metabolic Panel: Recent Labs  Lab  12/05/20 1844 12/06/20 0536 12/07/20 0252 12/09/20 0809 12/10/20 0757  NA 140 141 137 142 138  K 4.5 4.3 4.5 4.2 4.5  CL 106 105 104 105 106  CO2 24 26 26 28 24   GLUCOSE 146* 105* 90 82 97  BUN 25* 23* 29* 31* 45*  CREATININE 1.68* 1.51* 1.89* 1.68* 1.84*  CALCIUM 9.4 9.0 8.5* 8.9 9.0  MG  --   --  1.8  --   --   PHOS  --   --  3.7  --   --    Liver Function Tests: Recent Labs  Lab 12/05/20 1844 12/09/20 0809  AST 19 20  ALT 13 16  ALKPHOS 138* 108  BILITOT 0.9 0.5  PROT 7.6 6.5  ALBUMIN 3.9 3.6   Recent Labs  Lab 12/05/20 1844  LIPASE 28   No results for input(s): AMMONIA in the last 168 hours. CBC: Recent Labs  Lab 12/05/20 1844 12/06/20 0536 12/07/20 0252 12/09/20 0809 12/10/20 0757  WBC 10.4 8.5 7.8 5.3 6.1  NEUTROABS 8.9*  --   --  3.5  --   HGB 13.4 11.9* 10.4* 11.8* 11.0*  HCT 41.9 37.0 33.8* 37.4 35.6*  MCV 81.0 80.8 81.8 82.9 81.5  PLT 186 162 137* 137* 137*   Cardiac Enzymes: Recent Labs  Lab 12/07/20 0252  CKTOTAL 52   BNP: BNP (last 3 results) Recent Labs    01/09/20 0859  BNP 64.6    ProBNP (last 3 results) No results for input(s): PROBNP in the last 8760 hours.  CBG: Recent Labs  Lab 12/09/20 1149 12/09/20 1630 12/09/20 2102 12/10/20 0750 12/10/20 1158  GLUCAP 87 88 104* 81 129*       Signed:  Lacretia Nicks MD.  [facility] 12/10/2020, 3:27 PM

## 2020-12-10 NOTE — Telephone Encounter (Signed)
[name] with [facility] called asking for orders for PT OT, Social worker and home health.  They do not need RN orders.  CB# [phone]

## 2020-12-11 ENCOUNTER — Telehealth: Payer: Self-pay | Admitting: Oncology

## 2020-12-11 LAB — BETA-2-GLYCOPROTEIN I ABS, IGG/M/A
Beta-2 Glyco I IgG: 15 GPI IgG units (ref 0–20)
Beta-2-Glycoprotein I IgA: 13 GPI IgA units (ref 0–25)
Beta-2-Glycoprotein I IgM: <9 GPI IgM units (ref 0–32)

## 2020-12-11 LAB — HOMOCYSTEINE: Homocysteine: 19.1 umol/L — ABNORMAL HIGH (ref 0.0–14.5)

## 2020-12-11 LAB — LUPUS ANTICOAGULANT PANEL
DRVVT: 42.9 s (ref 0.0–47.0)
PTT Lupus Anticoagulant: 41.7 s (ref 0.0–51.9)

## 2020-12-11 LAB — PROTEIN S ACTIVITY: Protein S Activity: 64 % (ref 63–140)

## 2020-12-11 LAB — PROTEIN C ACTIVITY: Protein C Activity: 101 % (ref 73–180)

## 2020-12-11 LAB — CARDIOLIPIN ANTIBODIES, IGG, IGM, IGA
Anticardiolipin IgA: <9 APL U/mL (ref 0–11)
Anticardiolipin IgG: <9 GPL U/mL (ref 0–14)
Anticardiolipin IgM: 18 MPL U/mL — ABNORMAL HIGH (ref 0–12)

## 2020-12-11 LAB — PROTEIN S, TOTAL: Protein S Ag, Total: 102 % (ref 60–150)

## 2020-12-11 NOTE — Telephone Encounter (Signed)
Pt called to let us know that she just got of the hospital on 1-8. Needs to set up for her iron. Please call back at [phone]

## 2020-12-12 LAB — FACTOR 5 LEIDEN

## 2020-12-12 LAB — PROTEIN C, TOTAL: Protein C, Total: 90 % (ref 60–150)

## 2020-12-13 ENCOUNTER — Ambulatory Visit: Payer: Medicare Other

## 2020-12-13 ENCOUNTER — Other Ambulatory Visit: Payer: Self-pay | Admitting: Medical

## 2020-12-16 ENCOUNTER — Telehealth: Payer: Self-pay | Admitting: *Deleted

## 2020-12-16 ENCOUNTER — Encounter: Payer: Self-pay | Admitting: Oncology

## 2020-12-16 ENCOUNTER — Encounter: Payer: Self-pay | Admitting: Family Medicine

## 2020-12-16 DIAGNOSIS — I69398 Other sequelae of cerebral infarction: Secondary | ICD-10-CM

## 2020-12-16 DIAGNOSIS — E1122 Type 2 diabetes mellitus with diabetic chronic kidney disease: Secondary | ICD-10-CM

## 2020-12-16 DIAGNOSIS — D500 Iron deficiency anemia secondary to blood loss (chronic): Secondary | ICD-10-CM

## 2020-12-16 DIAGNOSIS — E1140 Type 2 diabetes mellitus with diabetic neuropathy, unspecified: Secondary | ICD-10-CM

## 2020-12-16 DIAGNOSIS — M6281 Muscle weakness (generalized): Secondary | ICD-10-CM

## 2020-12-16 DIAGNOSIS — N390 Urinary tract infection, site not specified: Secondary | ICD-10-CM

## 2020-12-16 DIAGNOSIS — I129 Hypertensive chronic kidney disease with stage 1 through stage 4 chronic kidney disease, or unspecified chronic kidney disease: Secondary | ICD-10-CM

## 2020-12-16 DIAGNOSIS — N1831 Chronic kidney disease, stage 3a: Secondary | ICD-10-CM

## 2020-12-16 DIAGNOSIS — G35 Multiple sclerosis: Secondary | ICD-10-CM

## 2020-12-16 DIAGNOSIS — I2510 Atherosclerotic heart disease of native coronary artery without angina pectoris: Secondary | ICD-10-CM

## 2020-12-16 LAB — PROTHROMBIN GENE MUTATION

## 2020-12-16 NOTE — Telephone Encounter (Signed)
Transition Care Management Follow-up Telephone Call Date of discharge and from where: 12/10/20 [facility] How have you been since you were released from the hospital? "Doing OK except for pain below both knees that started in the hospital. Tylenol doesn't help and I can't take anything else for pain because of my kidney disease"  Any questions or concerns? Yes- regarding how to treat new pain below both knees. Advised patient to call her primary care provider to schedule post hospital discharge follow up appointment this week  Items Reviewed: Did the pt receive and understand the discharge instructions provided? Yes  Medications obtained and verified? Yes  Other? No  Any new allergies since your discharge? No  Dietary orders reviewed? Yes Do you have support at home? Yes  - Friend checks on her multiple times daily and her son who lives with her  [place] and Equipment/Supplies: Were home health services ordered? yes If so, what is the name of the agency? [facility] for physical and occupational therapy  Has the agency set up a time to come to the patient's home? Yes, 12/17/20 for P.T. Were any new equipment or medical supplies ordered?  No, patient already had rolling walker and 3 in 1  What is the name of the medical supply agency? Not applicable Were you able to get the supplies/equipment? not applicable Do you have any questions related to the use of the equipment or supplies? No  Functional Questionnaire: (I = Independent and D = Dependent) ADLs: I  Bathing/Dressing- I  Meal Prep- Assist by son  Eating- I  Maintaining continence- uses bedside commode due to urinary incontinence  Transferring/Ambulation- using rolling walker  Managing Meds- I- uses pill packs  Follow up appointments reviewed:  PCP Hospital f/u appt confirmed? Yes  Scheduled to see Dr Caryn Section on 12/30/20 @ 8:40 am. [facility] f/u appt confirmed? Yes  Scheduled to see Dr. [name]   on 12/18/20 @ 10:20 am. Are transportation arrangements needed? No - friend and family will provide transportation If their condition worsens, is the pt aware to call PCP or go to the Emergency Dept.? Yes Was the patient provided with contact information for the PCP's office or ED? Yes Was to pt encouraged to call back with questions or concerns? Yes   Kelli Churn RN, CCM, [facility] Care Management Coordinator - Managed [place] [facility] [phone]

## 2020-12-17 NOTE — Telephone Encounter (Signed)
That's fine, she can have the 9:40 slot or the 11:20 slot on Friday. Thanks!

## 2020-12-18 ENCOUNTER — Ambulatory Visit (INDEPENDENT_AMBULATORY_CARE_PROVIDER_SITE_OTHER): Payer: Medicare Other | Admitting: Cardiovascular Disease

## 2020-12-18 ENCOUNTER — Other Ambulatory Visit: Payer: Self-pay

## 2020-12-18 ENCOUNTER — Encounter: Payer: Self-pay | Admitting: Cardiovascular Disease

## 2020-12-18 VITALS — BP 100/70 | HR 60 | Ht 63.0 in | Wt 159.4 lb

## 2020-12-18 DIAGNOSIS — I5022 Chronic systolic (congestive) heart failure: Secondary | ICD-10-CM | POA: Diagnosis not present

## 2020-12-18 DIAGNOSIS — I739 Peripheral vascular disease, unspecified: Secondary | ICD-10-CM

## 2020-12-18 DIAGNOSIS — E785 Hyperlipidemia, unspecified: Secondary | ICD-10-CM

## 2020-12-18 DIAGNOSIS — I25118 Atherosclerotic heart disease of native coronary artery with other forms of angina pectoris: Secondary | ICD-10-CM

## 2020-12-18 DIAGNOSIS — I10 Essential (primary) hypertension: Secondary | ICD-10-CM | POA: Diagnosis not present

## 2020-12-18 DIAGNOSIS — I255 Ischemic cardiomyopathy: Secondary | ICD-10-CM

## 2020-12-18 DIAGNOSIS — I951 Orthostatic hypotension: Secondary | ICD-10-CM

## 2020-12-18 DIAGNOSIS — I6523 Occlusion and stenosis of bilateral carotid arteries: Secondary | ICD-10-CM

## 2020-12-18 DIAGNOSIS — E782 Mixed hyperlipidemia: Secondary | ICD-10-CM | POA: Diagnosis not present

## 2020-12-18 DIAGNOSIS — N183 Chronic kidney disease, stage 3 unspecified: Secondary | ICD-10-CM

## 2020-12-18 DIAGNOSIS — I6329 Cerebral infarction due to unspecified occlusion or stenosis of other precerebral arteries: Secondary | ICD-10-CM

## 2020-12-18 NOTE — Patient Instructions (Addendum)
We will send a message to urology  Medication Instructions:  Stop the amlodipine for now  If you need a refill on your cardiac medications before your next appointment, please call your pharmacy.   Lab work: No new labs needed  Testing/Procedures: No new testing needed  Follow-Up: At [facility], you and your health needs are our priority.  As part of our continuing mission to provide you with exceptional heart care, we have created designated Provider Care Teams.  These Care Teams include your primary Cardiologist (physician) and Advanced Practice Providers (APPs -  Physician Assistants and Nurse Practitioners) who all work together to provide you with the care you need, when you need it.  You will need a follow up appointment in 3 months  Providers on your designated Care Team:   Murray Hodgkins, NP Christell Faith, PA-C [name], [place]  COVID-19 Vaccine Information can be found at: [email or web address] For questions related to vaccine distribution or appointments, please email [email or web address] or call [phone].

## 2020-12-18 NOTE — Progress Notes (Addendum)
Evaluation Performed:  Follow-up visit  Date:  12/18/2020   ID:  [place], [place] [birth year], MRN [id number]  Patient Location:  [address] [place] [address]   Provider location:   [facility], [place] office  PCP:  [name], MD  Cardiologist:  Arvid [facility]  Chief Complaint  Patient presents with   Follow up HTN     Patient c/o shortness of breath with feet swelling. Medications reviewed by the patient verbally.     History of Present Illness:    [name] is a 50 y.o. female past medical history of smoking,  strokes,  2010 chest pain dating back to 2005  CAD, Previous LAD stent Catheterization May 2017 with stent placed to her first diagonal, there was residual 50% distal LAD disease, 30% proximal, 40% proximal circumflex  normal LV function,   s/p gastric bypass surgery, Has no stomach Baseline creatinine 1.5, previous ly taken off her diuretic Labile pressures GI bleed 12/21 into 02/2020, 10 units transfused Who presents for routine followup of her coronary artery disease  Was self cathing at home 4x a day, "not emptying bladder", has incontinence Was off her prophylactic ABX,  Recent hospitalization for UTI, encephalopathy Blood pressure elevated in the hospital, restarted on amlodipine  Now at home, Working with PT, BP  low Low today as well Continues to have dysuria, thinks her infection has persisted  MRI brain moderate chronic small vessel disease, cerebral atrophy, small focus of acute ischemia within the right middle cerebral peduncle Was on Plavix as outpatient  aspirin restarted at discharge  On stool softener for constipation Son is EMT, helps her at home Presents with walker today  Prior history of UTIs,  enterococcus, ecoli Most recently Klebsiella pneumonia  Takes Lasix very sparingly  Lab work reviewed CR 1.8  EKG personally reviewed by myself on todays  visit Normal sinus rhythm with rate 60 bpm no significant ST or T wave changes  Echo 04/23/2020: normal EF  Other past medical history reviewed Hospital records reviewed, 12/21 to 02/2020  balloon-assisted enteroscopy for management of an UGI bleed. Localization with nuclear imaging and CT scan suggested the source in the bypassed stomach or proximal duodenum.   hospitalized at [facility] with GI bleed, anemia and malnutrition. She was treated with 4 units of packed red blood cells and short-term enteral nutrition   Cath 12/2019 1.  Patent stents in the mid LAD and first diagonal with mild to moderate in-stent restenosis, stable moderate mid RCA stenosis, stable diffuse disease in the superior branch of the first diagonal, moderate mid LAD stenosis at the bifurcation of second diagonal which has no significant ostial disease.  There is severe progression of stenosis in the mid to distal LAD.  In addition, there is moderately severe disease in the apical LAD. 2.  Normal left ventricular end-diastolic pressure. 3.  Successful angioplasty and drug-eluting stent placement to the mid to distal LAD  Echocardiogram October 2021 Ejection fraction 60%  Prior hospitalizations  hypoglycemia 10/2019  Cath 04/2019  Non-stenotic 1st Diag lesion was previously treated. Ost 1st Diag lesion is 40% stenosed. Mid LAD-2 lesion is 5% stenosed. Ost 2nd Diag to 2nd Diag lesion is 95% stenosed. Mid LAD-1 lesion is 30% stenosed. Dist LAD lesion is 80% stenosed. Prox Cx lesion is 40% stenosed. Mid RCA lesion is 30% stenosed.   Past Medical History:  Diagnosis Date   Acute  colitis 01/27/2017   Acute pyelonephritis    Acute upper GI bleed 01/25/2020   Anemia    iron deficiency anemia   Aortic arch aneurysm    BRCA negative 2014   CAD (coronary artery disease)    a. 08/2003 Cath: LAD 30-40-med Rx; b. 11/2014 PCI: LAD 31m(3.25x23 Xience Alpine DES); c. 06/2015 PCI: D1 (2.25x12 Resolute Integrity  DES); d. 06/2017 PCI: Patent mLAD stent, D2 95 (PTCA); e. 09/2017 PCI: D2 99ost (CBA); d. 12/2017 Cath: LM nl, LAD 364m80d (small), D1 40ost, D2 95ost, LCX 40p, RCA 40ost/p->Med rx for D2 given restenosis.   Colitis 06/03/2015   Colon polyp    CVA (cerebral vascular accident) ([facility]   Left side weakness.    Degenerative tear of glenoid labrum of right shoulder 03/15/2017   Diabetes mellitus without complication ([facility]   Family history of breast cancer    BRCA neg 2014   Femur fracture, left ([facility]9/10/2018   Gastric ulcer 04/27/2011   History of echocardiogram    a. 03/2017 Echo: EF 60-65%, no rwma; b. 02/2018 Echo: EF 60-65%, no rwma. Nl RV fxn. No cardiac source of emboli (admitted w/ stroke).   Hypertension    Malignant melanoma of skin of scalp (HCC)    MI, acute, non ST segment elevation (HCC)    Neuromuscular disorder (HCC)    S/P drug eluting coronary stent placement 06/04/2015   Sepsis ([facility]/14/2019   Sepsis secondary to UTI ([facility]3/22/2022   Stroke ([facility]   a. 02/2018 MRI: 31m631mate acute/early subacute L medial frontal lobe inarct; b. 02/2018 MRA No large vessel occlusion or aneurysm. Mod to sev L P2 stenosis. thready L vertebral artery, diffusely dzs'd; c. 02/2018 Carotid U/S: <50% bilat ICA dzs.   Past Surgical History:  Procedure Laterality Date   APPENDECTOMY     BALLOON ENTEROSCOPY  02/06/2020   [facility]   BIOPSY N/A 03/14/2020   Procedure: BIOPSY;  Surgeon: WohLucilla LameD;  [facility];  Laterality: N/A;   CARDIAC CATHETERIZATION N/A 11/09/2014   Procedure: Coronary Angiography;  Surgeon: TimMinna MerrittsD;  [facility];  Service: Cardiovascular;  Laterality: N/A;   CARDIAC CATHETERIZATION N/A 11/12/2014   Procedure: Coronary Stent Intervention;  Surgeon: AleIsaias CowmanD;  [name];  Service: Cardiovascular;  Laterality: N/A;   CARDIAC CATHETERIZATION N/A 04/18/2015   Procedure: Left Heart  Cath and Coronary Angiography;  Surgeon: TimMinna MerrittsD;  [facility];  Service: Cardiovascular;  Laterality: N/A;   CARDIAC CATHETERIZATION Left 06/04/2015   Procedure: Left Heart Cath and Coronary Angiography;  Surgeon: MuhWellington HampshireD;  [facility];  Service: Cardiovascular;  Laterality: Left;   CARDIAC CATHETERIZATION N/A 06/04/2015   Procedure: Coronary Stent Intervention;  Surgeon: MuhWellington HampshireD;  [facility];  Service: Cardiovascular;  Laterality: N/A;   CESAREAN SECTION  2001   CHOLECYSTECTOMY N/A 11/18/2016   Procedure: LAPAROSCOPIC CHOLECYSTECTOMY WITH INTRAOPERATIVE CHOLANGIOGRAM;  Surgeon: SanChristene LyeD;  [facility];  Service: General;  Laterality: N/A;   COLONOSCOPY WITH PROPOFOL N/A 04/27/2016   Procedure: COLONOSCOPY WITH PROPOFOL;  Surgeon: DarLucilla LameD;  [place] [facility] [place];  Laterality: N/A;   COLONOSCOPY WITH PROPOFOL N/A 01/12/2018   Procedure: COLONOSCOPY WITH PROPOFOL;  Surgeon: Toledo, TeoBenay PikeD;  Location[facility];  Service: Endoscopy;  Laterality: N/A;  COLONOSCOPY WITH PROPOFOL N/A 03/14/2020   Procedure: COLONOSCOPY WITH PROPOFOL;  Surgeon: [name], MD;  Location: [place];  Service: Endoscopy;  Laterality: N/A;  Needs to be scheduled after 7:30 due to driver issues   CORONARY ANGIOPLASTY     CORONARY BALLOON ANGIOPLASTY N/A 06/29/2017   Procedure: CORONARY BALLOON ANGIOPLASTY;  Surgeon: Wellington Hampshire, MD;  Location: [place] CV LAB;  Service: Cardiovascular;  Laterality: N/A;   CORONARY BALLOON ANGIOPLASTY N/A 09/20/2017   Procedure: CORONARY BALLOON ANGIOPLASTY;  Surgeon: Wellington Hampshire, MD;  Location: [place] CV LAB;  Service: Cardiovascular;  Laterality: N/A;   CORONARY STENT INTERVENTION N/A 12/13/2019   Procedure: CORONARY STENT INTERVENTION;  Surgeon: Wellington Hampshire, MD;  Location: [place]  CV LAB;  Service: Cardiovascular;  Laterality: N/A;   DILATION AND CURETTAGE OF UTERUS     ESOPHAGOGASTRODUODENOSCOPY (EGD) WITH PROPOFOL N/A 09/14/2014   Procedure: ESOPHAGOGASTRODUODENOSCOPY (EGD) WITH PROPOFOL;  Surgeon: Josefine Class, MD;  [facility] ENDOSCOPY;  Service: Endoscopy;  Laterality: N/A;   ESOPHAGOGASTRODUODENOSCOPY (EGD) WITH PROPOFOL N/A 04/27/2016   Procedure: ESOPHAGOGASTRODUODENOSCOPY (EGD) WITH PROPOFOL;  Surgeon: [name], MD;  Location: [place];  Service: Endoscopy;  Laterality: N/A;  Diabetic - oral meds   ESOPHAGOGASTRODUODENOSCOPY (EGD) WITH PROPOFOL N/A 01/12/2018   Procedure: ESOPHAGOGASTRODUODENOSCOPY (EGD) WITH PROPOFOL;  Surgeon: Toledo, Benay Pike, MD;  Location[facility];  Service: Endoscopy;  Laterality: N/A;   ESOPHAGOGASTRODUODENOSCOPY (EGD) WITH PROPOFOL N/A 04/11/2019   Procedure: ESOPHAGOGASTRODUODENOSCOPY (EGD) WITH PROPOFOL;  Surgeon: [name], MD;  Location: [facility];  Service: Endoscopy;  Laterality: N/A;   ESOPHAGOGASTRODUODENOSCOPY (EGD) WITH PROPOFOL N/A 03/08/2020   Procedure: ESOPHAGOGASTRODUODENOSCOPY (EGD) WITH PROPOFOL;  Surgeon: [name], MD;  Location: [facility] END[facility];  Service: Endoscopy;  Laterality: N/A;   GASTRIC BYPASS  09/2009   [facility]    INTRAMEDULLARY (IM) NAIL INTERTROCHANTERIC Left 10/13/2018   Procedure: INTRAMEDULLARY (IM) NAIL INTERTROCHANTRIC;  Surgeon: Leandrew Koyanagi, MD;  Location: [place];  Service: Orthopedics;  Laterality: Left;   Left Carotid to sublcavian artery bypass w/ subclavian artery ligation     a. Performed @ [facility].   LEFT HEART CATH AND CORONARY ANGIOGRAPHY Left 06/29/2017   Procedure: LEFT HEART CATH AND CORONARY ANGIOGRAPHY;  Surgeon: Wellington Hampshire, MD;  Location: [place] CV LAB;  Service: Cardiovascular;  Laterality: Left;   LEFT HEART CATH AND CORONARY ANGIOGRAPHY N/A 09/20/2017   Procedure: LEFT HEART CATH AND CORONARY ANGIOGRAPHY;  Surgeon:  Wellington Hampshire, MD;  Location: [place] CV LAB;  Service: Cardiovascular;  Laterality: N/A;   LEFT HEART CATH AND CORONARY ANGIOGRAPHY N/A 12/20/2017   Procedure: LEFT HEART CATH AND CORONARY ANGIOGRAPHY;  Surgeon: Wellington Hampshire, MD;  Location: [place] CV LAB;  Service: Cardiovascular;  Laterality: N/A;   LEFT HEART CATH AND CORONARY ANGIOGRAPHY N/A 04/20/2019   Procedure: LEFT HEART CATH AND CORONARY ANGIOGRAPHY possible PCI;  Surgeon: Minna Merritts, MD;  Location: [place] CV LAB;  Service: Cardiovascular;  Laterality: N/A;   LEFT HEART CATH AND CORONARY ANGIOGRAPHY N/A 12/13/2019   Procedure: LEFT HEART CATH AND CORONARY ANGIOGRAPHY;  Surgeon: Wellington Hampshire, MD;  Location: [place] CV LAB;  Service: Cardiovascular;  Laterality: N/A;   MELANOMA EXCISION  2016   Dr. Henreitta Cea ABLATION  2002   RIGHT OOPHORECTOMY     SHOULDER ARTHROSCOPY WITH OPEN ROTATOR CUFF REPAIR Right 01/07/2016   Procedure: SHOULDER ARTHROSCOPY WITH DEBRIDMENT, SUBACHROMIAL DECOMPRESSION;  Surgeon: Corky Mull, MD;  Location: [facility];  Service: Orthopedics;  Laterality: Right;   SHOULDER ARTHROSCOPY WITH OPEN ROTATOR CUFF REPAIR Right 03/16/2017   Procedure: SHOULDER ARTHROSCOPY WITH OPEN ROTATOR CUFF REPAIR POSSIBLE BICEPS TENODESIS;  Surgeon: Corky Mull, MD;  Location: [facility];  Service: Orthopedics;  Laterality: Right;   TRIGGER FINGER RELEASE Right     Middle Finger      Allergies:   Lipitor [atorvastatin]   Social History   Tobacco Use   Smoking status: Former    Packs/day: 0.25    Years: 1.00    Pack years: 0.25    Types: Cigarettes    Quit date: 08/31/1994    Years since quitting: 26.3   Smokeless tobacco: Never   Tobacco comments:    quit 28 years ago  Vaping Use   Vaping Use: Never used  Substance Use Topics   Alcohol use: No    Alcohol/week: 0.0 standard drinks   Drug use: No     Current Outpatient Medications on File Prior to Visit  Medication Sig  Dispense Refill   AIMOVIG 140 MG/ML SOAJ Inject into the skin.     ALPRAZolam (XANAX) 1 MG tablet Take 1 tablet by mouth twice daily 60 tablet 5   amLODipine (NORVASC) 2.5 MG tablet Take 2 tablets (5 mg total) by mouth daily. 60 tablet 0   aspirin EC 81 MG EC tablet Take 1 tablet (81 mg total) by mouth daily. Swallow whole. 30 tablet 1   Baclofen 5 MG TABS Take 1 tablet by mouth 2 (two) times daily as needed.     BAQSIMI ONE PACK 3 MG/DOSE POWD Place 1 spray into both nostrils as directed.     buPROPion (WELLBUTRIN SR) 100 MG 12 hr tablet Take 100 mg by mouth 2 (two) times daily.     carvedilol (COREG) 12.5 MG tablet Take 1 tablet by mouth twice daily 60 tablet 11   clopidogrel (PLAVIX) 75 MG tablet Take 1 tablet (75 mg total) by mouth daily. 90 tablet 3   Desvenlafaxine ER (PRISTIQ) 50 MG TB24 Take 1 tablet (50 mg total) by mouth every morning. 90 tablet 3   diphenoxylate-atropine (LOMOTIL) 2.5-0.025 MG tablet Take 1 tablet by mouth 4 (four) times daily as needed for diarrhea or loose stools. 120 tablet 5   estradiol (ESTRACE) 0.1 MG/GM vaginal cream Estrogen Cream Instruction Discard applicator Apply pea sized amount to tip of finger to urethra before bed. Wash hands well after application. Use Monday, Wednesday and Friday 42.5 g 12   furosemide (LASIX) 20 MG tablet Take 1 tablet by mouth every day as needed for swelling 30 tablet 6   gabapentin (NEURONTIN) 300 MG capsule Take 2 capsules (600 mg total) by mouth 2 (two) times daily. 360 capsule 4   glucose 4 GM chewable tablet Chew 1 tablet (4 g total) by mouth 3 (three) times daily. 50 tablet 12   isosorbide mononitrate (IMDUR) 120 MG 24 hr tablet Take 1 tablet by mouth every day 30 tablet 0   levothyroxine (SYNTHROID) 25 MCG tablet Take 1 tablet by mouth 30 minutes before breakfast 30 tablet 5   nitroGLYCERIN (NITROSTAT) 0.4 MG SL tablet Place 1 tablet (0.4 mg total) under the tongue every 5 (five) minutes as needed for chest pain. Do not exceed  3 dose within 30 mins 25 tablet 3   pantoprazole (PROTONIX) 40 MG tablet Take 1 tablet (40 mg total) by mouth 2 (two) times daily. 60 tablet  1   Polyethyl Glycol-Propyl Glycol (SYSTANE OP) Place 1 drop into both eyes daily as needed (dry eyes).     promethazine (PHENERGAN) 25 MG tablet Take 1 tablet (25 mg total) by mouth every 6 (six) hours as needed for nausea or vomiting. 30 tablet 11   rosuvastatin (CRESTOR) 40 MG tablet Take 1 tablet (40 mg total) by mouth daily at 6 PM. 90 tablet 3   traZODone (DESYREL) 100 MG tablet Take 1 to 2 tablets by mouth at bedtime 30 tablet 5   desvenlafaxine (PRISTIQ) 50 MG 24 hr tablet Take 50 mg by mouth daily. (Patient not taking: Reported on 12/18/2020)     diclofenac Sodium (VOLTAREN) 1 % GEL Apply 2 g topically 4 (four) times daily. (Patient not taking: Reported on 12/18/2020)     No current facility-administered medications on file prior to visit.     Family Hx: The patient's family history includes Anxiety disorder in her father and mother; Bipolar disorder in her mother; Breast cancer in an other family member; Breast cancer (age of onset: 60) in her maternal aunt and maternal aunt; Colon cancer in her cousin and father; Depression in her father, mother, and sister; Diabetes in her father and sister; Heart disease in her mother; Heart disease (age of onset: 42) in her father; Hyperlipidemia in her mother and sister; Hypertension in her father, mother, sister, and sister; Kidney cancer in her cousin; Kidney disease in her father and sister; Ovarian cancer in her cousin; Skin cancer in her father; Stroke in her father; Thyroid nodules in her sister. There is no history of Bladder Cancer.  ROS:   Please see the history of present illness.    Review of Systems  Constitutional: Negative.   HENT: Negative.    Respiratory: Negative.    Cardiovascular: Negative.   Gastrointestinal: Negative.   Genitourinary:  Positive for dysuria.  Musculoskeletal: Negative.    Neurological: Negative.   Psychiatric/Behavioral: Negative.    All other systems reviewed and are negative.  Labs/Other Tests and Data Reviewed:    Recent Labs: 01/09/2020: BNP 64.6 12/07/2020: Magnesium 1.8; TSH 1.489 12/09/2020: ALT 16 12/10/2020: BUN 45; Creatinine, Ser 1.84; Hemoglobin 11.0; Platelets 137; Potassium 4.5; Sodium 138   Recent Lipid Panel Lab Results  Component Value Date/Time   CHOL 179 12/06/2020 05:36 AM   CHOL 159 05/28/2016 12:12 PM   CHOL 197 12/30/2013 03:55 AM   TRIG 137 12/06/2020 05:36 AM   TRIG 261 (H) 12/30/2013 03:55 AM   HDL 28 (L) 12/06/2020 05:36 AM   HDL 34 (L) 05/28/2016 12:12 PM   HDL 28 (L) 12/30/2013 03:55 AM   CHOLHDL 6.4 12/06/2020 05:36 AM   LDLCALC 124 (H) 12/06/2020 05:36 AM   LDLCALC 103 (H) 12/08/2016 12:21 PM   LDLCALC 117 (H) 12/30/2013 03:55 AM   LDLDIRECT 83.3 01/26/2011 09:13 AM    Wt Readings from Last 3 Encounters:  12/18/20 159 lb 6 oz (72.3 kg)  12/05/20 159 lb 13.3 oz (72.5 kg)  11/26/20 160 lb (72.6 kg)     Exam:    Vital Signs: Vital signs may also be detailed in the HPI BP 100/70 (BP Location: Left Arm, Patient Position: Sitting, Cuff Size: Normal)   Pulse 60   Ht '5\' 3"'  (1.6 m)   Wt 159 lb 6 oz (72.3 kg)   SpO2 97%   BMI 28.23 kg/m   Constitutional:  oriented to person, place, and time. No distress.  Walking with a walker HENT:  Head:  Grossly normal Eyes:  no discharge. No scleral icterus.  Neck: No JVD, no carotid bruits  Cardiovascular: Regular rate and rhythm, no murmurs appreciated Pulmonary/Chest: Clear to auscultation bilaterally, no wheezes or rails Abdominal: Soft.  no distension.  no tenderness.  Musculoskeletal: Normal range of motion Neurological:  normal muscle tone. Coordination normal. No atrophy Skin: Skin warm and dry Psychiatric: normal affect, pleasant   ASSESSMENT & PLAN:    Problem List Items Addressed This Visit       Cardiology Problems   Stroke [facility])   Essential  hypertension   Hypotension   Ischemic cardiomyopathy   Carotid stenosis   Other Visit Diagnoses     Coronary artery disease of native artery of native heart with stable angina pectoris ([place])    -  Primary   Relevant Orders   EKG 04-UGQB   Chronic systolic CHF (congestive heart failure) (HCC)       Relevant Orders   EKG 12-Lead   Hyperlipidemia, mixed       Stage 3 chronic kidney disease, unspecified whether stage 3a or 3b CKD ([place])       Peripheral arterial disease ([place])       Hyperlipidemia LDL goal <70         Coronary artery disease with stable angina Currently with no symptoms of angina. No further workup at this time. Continue current medication regimen.  Urinary tract infection Prior history of UTI, Enterococcus, E. Coli Recent hospitalization UTI Klebsiella pneumonia requiring IV antibiotics, presented with encephalopathy -Blood pressure low on today's visit, still with dysuria after recent discharge -Contacted urology, appreciate their assistance, they will arrange follow-up  Hypotension Hold amlodipine for now, could be low in the setting of UTI Further testing per urology, she will continue to monitor blood pressure at home  History of stroke Continue  Plavix BP well controlled  GI bleed: Followed by Dr. Allen Norris Back on aspirin with Plavix Recommend she be on guard for recurrent GI bleeds  Anemia: On aspirin Plavix, will need periodic monitoring of CBC   Total encounter time more than 25 minutes  Greater than 50% was spent in counseling and coordination of care with the patient    Signed, Ida Rogue, [place] [address], [place], [place] [address]

## 2020-12-19 ENCOUNTER — Inpatient Hospital Stay: Payer: Medicare Other

## 2020-12-19 ENCOUNTER — Ambulatory Visit (INDEPENDENT_AMBULATORY_CARE_PROVIDER_SITE_OTHER): Payer: Medicare Other | Admitting: Physician Assistant

## 2020-12-19 ENCOUNTER — Encounter: Payer: Self-pay | Admitting: Physician Assistant

## 2020-12-19 VITALS — BP 133/70 | HR 61 | Temp 98.1°F | Resp 16

## 2020-12-19 VITALS — BP 134/78 | HR 66 | Temp 98.0°F | Ht 63.0 in | Wt 159.0 lb

## 2020-12-19 DIAGNOSIS — D509 Iron deficiency anemia, unspecified: Secondary | ICD-10-CM | POA: Insufficient documentation

## 2020-12-19 DIAGNOSIS — D508 Other iron deficiency anemias: Secondary | ICD-10-CM

## 2020-12-19 DIAGNOSIS — R300 Dysuria: Secondary | ICD-10-CM | POA: Diagnosis not present

## 2020-12-19 LAB — URINALYSIS, COMPLETE
Bilirubin, UA: NEGATIVE
Glucose, UA: NEGATIVE
Ketones, UA: NEGATIVE
Leukocytes,UA: NEGATIVE
Nitrite, UA: NEGATIVE
Protein,UA: NEGATIVE
RBC, UA: NEGATIVE
Specific Gravity, UA: 1.01 (ref 1.005–1.030)
Urobilinogen, Ur: 0.2 mg/dL (ref 0.2–1.0)
pH, UA: 5.5 (ref 5.0–7.5)

## 2020-12-19 LAB — MICROSCOPIC EXAMINATION: RBC, Urine: NONE SEEN /hpf (ref 0–2)

## 2020-12-19 MED ORDER — IRON SUCROSE 20 MG/ML IV SOLN
200.0000 mg | Freq: Once | INTRAVENOUS | Status: AC
  Administered 2020-12-19: 15:00:00 200 mg via INTRAVENOUS
  Filled 2020-12-19: qty 10

## 2020-12-19 MED ORDER — SODIUM CHLORIDE 0.9 % IV SOLN
Freq: Once | INTRAVENOUS | Status: AC
  Filled 2020-12-19: qty 250

## 2020-12-19 MED ORDER — SODIUM CHLORIDE 0.9 % IV SOLN: 200.0000 mg | Freq: Once | INTRAVENOUS | Status: DC

## 2020-12-19 NOTE — Progress Notes (Signed)
In and Out Catheterization  Patient is present today for a I & O catheterization due to dysuria. Patient was cleaned and prepped in a sterile fashion with betadine . A 14FR cath was inserted, no complications were noted , 64ml of urine return was noted, urine was light yellow in color. A clean urine sample was collected for urinalysis. Bladder was drained  And catheter was removed with out difficulty.    Performed by: Bradly Bienenstock CMA

## 2020-12-19 NOTE — Patient Instructions (Signed)

## 2020-12-19 NOTE — Progress Notes (Signed)
12/19/2020 3:42 PM   [place] 06/08/1970 [id number]  CC: Chief Complaint  Patient presents with   Dysuria   HPI: [name] is a 50 y.o. female with PMH stroke, diabetes, CKD, recurrent UTI, incomplete bladder emptying managed with CIC 4 times daily, and dysuria who presents today for evaluation of possible UTI.   She was admitted from 12/05/2020 to 12/10/2020 with hypertensive emergency leading to acute metabolic encephalopathy, CVA, and possible UTI.  Admission urine culture grew ampicillin resistant and nitrofurantoin and intermediate Klebsiella pneumoniae.  She was treated with culture appropriate Rocephin.  Today she reports she feels that her UTI never fully went away.  She reports ongoing dysuria, low back pain, nausea, and frequency.  She states that her recent hospitalization was due to UTI and she did not know that she had an infection at that time.  Notably, she underwent urodynamics at [place] on 11/18/2020.  She is scheduled to review her results with Dr. Matilde Sprang in the coming weeks.  Per chart review, it appears Dr. Matilde Sprang was planning to schedule her for cystoscopy as well, however he forgot to mention this to her at the time of her visit and so he deferred scheduling to her follow-up.  In-office catheterized UA today pan negative; urine microscopy with many bacteria.  PMH: Past Medical History:  Diagnosis Date   Acute colitis 01/27/2017   Acute pyelonephritis    Acute upper GI bleed 01/25/2020   Anemia    iron deficiency anemia   Aortic arch aneurysm    BRCA negative 2014   CAD (coronary artery disease)    a. 08/2003 Cath: LAD 30-40-med Rx; b. 11/2014 PCI: LAD 30m(3.25x23 Xience Alpine DES); c. 06/2015 PCI: D1 (2.25x12 Resolute Integrity DES); d. 06/2017 PCI: Patent mLAD stent, D2 95 (PTCA); e. 09/2017 PCI: D2 99ost (CBA); d. 12/2017 Cath: LM nl, LAD 359m80d (small), D1 40ost, D2 95ost, LCX 40p, RCA 40ost/p->Med rx for  D2 given restenosis.   Colitis 06/03/2015   Colon polyp    CVA (cerebral vascular accident) ([facility]   Left side weakness.    Degenerative tear of glenoid labrum of right shoulder 03/15/2017   Diabetes mellitus without complication ([facility]   Family history of breast cancer    BRCA neg 2014   Femur fracture, left ([facility]9/10/2018   Gastric ulcer 04/27/2011   History of echocardiogram    a. 03/2017 Echo: EF 60-65%, no rwma; b. 02/2018 Echo: EF 60-65%, no rwma. Nl RV fxn. No cardiac source of emboli (admitted w/ stroke).   Hypertension    Malignant melanoma of skin of scalp (HCC)    MI, acute, non ST segment elevation (HCC)    Neuromuscular disorder (HCC)    S/P drug eluting coronary stent placement 06/04/2015   Sepsis ([facility]/14/2019   Sepsis secondary to [facility]TI ([facility]/22/2022   Stroke ([facility]   a. 02/2018 MRI: 75m90mate acute/early subacute L medial frontal lobe inarct; b. 02/2018 MRA No large vessel occlusion or aneurysm. Mod to sev L P2 stenosis. thready L vertebral artery, diffusely dzs'd; c. 02/2018 Carotid U/S: <50% bilat ICA dzs.    Surgical History: Past Surgical History:  Procedure Laterality Date   APPENDECTOMY     BALLOON ENTEROSCOPY  02/06/2020   [facility]   BIOPSY N/A 03/14/2020   Procedure: BIOPSY;  Surgeon: WohLucilla LameD;  [facility];  Laterality: N/A;   CARDIAC CATHETERIZATION N/A 11/09/2014  Procedure: Coronary Angiography;  Surgeon: Minna Merritts, MD;  Location: [place] [facility];  Service: Cardiovascular;  Laterality: N/A;   CARDIAC CATHETERIZATION N/A 11/12/2014   Procedure: Coronary Stent Intervention;  Surgeon: Isaias Cowman, MD;  Location: [place] [facility];  Service: Cardiovascular;  Laterality: N/A;   CARDIAC CATHETERIZATION N/A 04/18/2015   Procedure: Left Heart Cath and Coronary Angiography;  Surgeon: Minna Merritts, MD;  Location: [place] [facility];  Service: Cardiovascular;  Laterality: N/A;   CARDIAC  CATHETERIZATION Left 06/04/2015   Procedure: Left Heart Cath and Coronary Angiography;  Surgeon: Wellington Hampshire, MD;  Location: [place] [facility];  Service: Cardiovascular;  Laterality: Left;   CARDIAC CATHETERIZATION N/A 06/04/2015   Procedure: Coronary Stent Intervention;  Surgeon: Wellington Hampshire, MD;  Location: [place] [facility];  Service: Cardiovascular;  Laterality: N/A;   CESAREAN SECTION  2001   CHOLECYSTECTOMY N/A 11/18/2016   Procedure: LAPAROSCOPIC CHOLECYSTECTOMY WITH INTRAOPERATIVE CHOLANGIOGRAM;  Surgeon: Christene Lye, MD;  Location: [facility];  Service: General;  Laterality: N/A;   COLONOSCOPY WITH PROPOFOL N/A 04/27/2016   Procedure: COLONOSCOPY WITH PROPOFOL;  Surgeon: [name], MD;  Location: [place];  Service: Endoscopy;  Laterality: N/A;   COLONOSCOPY WITH PROPOFOL N/A 01/12/2018   Procedure: COLONOSCOPY WITH PROPOFOL;  Surgeon: Toledo, Benay Pike, MD;  Location: [facility];  Service: Endoscopy;  Laterality: N/A;   COLONOSCOPY WITH PROPOFOL N/A 03/14/2020   Procedure: COLONOSCOPY WITH PROPOFOL;  Surgeon: [name], MD;  Location: [place];  Service: Endoscopy;  Laterality: N/A;  Needs to be scheduled after 7:30 due to driver issues   CORONARY ANGIOPLASTY     CORONARY BALLOON ANGIOPLASTY N/A 06/29/2017   Procedure: CORONARY BALLOON ANGIOPLASTY;  Surgeon: Wellington Hampshire, MD;  Location: [place] [facility];  Service: Cardiovascular;  Laterality: N/A;   CORONARY BALLOON ANGIOPLASTY N/A 09/20/2017   Procedure: CORONARY BALLOON ANGIOPLASTY;  Surgeon: Wellington Hampshire, MD;  Location: [place] [facility];  Service: Cardiovascular;  Laterality: N/A;   CORONARY STENT INTERVENTION N/A 12/13/2019   Procedure: CORONARY STENT INTERVENTION;  Surgeon: Wellington Hampshire, MD;  Location: [place] [facility];  Service: Cardiovascular;  Laterality: N/A;   DILATION AND CURETTAGE OF UTERUS     ESOPHAGOGASTRODUODENOSCOPY (EGD) WITH PROPOFOL N/A  09/14/2014   Procedure: ESOPHAGOGASTRODUODENOSCOPY (EGD) WITH PROPOFOL;  Surgeon: Josefine Class, MD;  Location[facility] END[facility]COPY;  Service: Endoscopy;  Laterality: N/A;   ESOPHAGOGASTRODUODENOSCOPY (EGD) WITH PROPOFOL N/A 04/27/2016   Procedure: ESOPHAGOGASTRODUODENOSCOPY (EGD) WITH PROPOFOL;  Surgeon: [name], MD;  Location: [place];  Service: Endoscopy;  Laterality: N/A;  Diabetic - oral meds   ESOPHAGOGASTRODUODENOSCOPY (EGD) WITH PROPOFOL N/A 01/12/2018   Procedure: ESOPHAGOGASTRODUODENOSCOPY (EGD) WITH PROPOFOL;  Surgeon: Toledo, Benay Pike, MD;  Location: [facility];  Service: Endoscopy;  Laterality: N/A;   ESOPHAGOGASTRODUODENOSCOPY (EGD) WITH PROPOFOL N/A 04/11/2019   Procedure: ESOPHAGOGASTRODUODENOSCOPY (EGD) WITH PROPOFOL;  Surgeon: [name], MD;  Location: [facility];  Service: Endoscopy;  Laterality: N/A;   ESOPHAGOGASTRODUODENOSCOPY (EGD) WITH PROPOFOL N/A 03/08/2020   Procedure: ESOPHAGOGASTRODUODENOSCOPY (EGD) WITH PROPOFOL;  Surgeon: [name], MD;  Location[facility] ENDOSCOPY;  Service: Endoscopy;  Laterality: N/A;   GASTRIC BYPASS  09/2009   [facility]    INTRAMEDULLARY (IM) NAIL INTERTROCHANTERIC Left 10/13/2018   Procedure: INTRAMEDULLARY (IM) NAIL INTERTROCHANTRIC;  Surgeon: Leandrew Koyanagi, MD;  Location: [place];  Service: Orthopedics;  Laterality: Left;   Left Carotid to sublcavian artery bypass w/  subclavian artery ligation     a. Performed @ [facility].   LEFT HEART CATH AND CORONARY ANGIOGRAPHY Left 06/29/2017   Procedure: LEFT HEART CATH AND CORONARY ANGIOGRAPHY;  Surgeon: Wellington Hampshire, MD;  Location: [place] CV LAB;  Service: Cardiovascular;  Laterality: Left;   LEFT HEART CATH AND CORONARY ANGIOGRAPHY N/A 09/20/2017   Procedure: LEFT HEART CATH AND CORONARY ANGIOGRAPHY;  Surgeon: Wellington Hampshire, MD;  Location: [place] [facility];  Service: Cardiovascular;  Laterality: N/A;   LEFT HEART CATH AND CORONARY ANGIOGRAPHY N/A  12/20/2017   Procedure: LEFT HEART CATH AND CORONARY ANGIOGRAPHY;  Surgeon: Wellington Hampshire, MD;  Location: [place] [facility];  Service: Cardiovascular;  Laterality: N/A;   LEFT HEART CATH AND CORONARY ANGIOGRAPHY N/A 04/20/2019   Procedure: LEFT HEART CATH AND CORONARY ANGIOGRAPHY possible PCI;  Surgeon: Minna Merritts, MD;  Location: [place] CV LAB;  Service: Cardiovascular;  Laterality: N/A;   LEFT HEART CATH AND CORONARY ANGIOGRAPHY N/A 12/13/2019   Procedure: LEFT HEART CATH AND CORONARY ANGIOGRAPHY;  Surgeon: Wellington Hampshire, MD;  Location: [place] [facility] LAB;  Service: Cardiovascular;  Laterality: N/A;   MELANOMA EXCISION  2016   Dr. Henreitta Cea ABLATION  2002   RIGHT OOPHORECTOMY     SHOULDER ARTHROSCOPY WITH OPEN ROTATOR CUFF REPAIR Right 01/07/2016   Procedure: SHOULDER ARTHROSCOPY WITH DEBRIDMENT, SUBACHROMIAL DECOMPRESSION;  Surgeon: Corky Mull, MD;  Location: [facility];  Service: Orthopedics;  Laterality: Right;   SHOULDER ARTHROSCOPY WITH OPEN ROTATOR CUFF REPAIR Right 03/16/2017   Procedure: SHOULDER ARTHROSCOPY WITH OPEN ROTATOR CUFF REPAIR POSSIBLE BICEPS TENODESIS;  Surgeon: Corky Mull, MD;  Location: [facility];  Service: Orthopedics;  Laterality: Right;   TRIGGER FINGER RELEASE Right     Middle Finger    Home Medications:  Allergies as of 12/19/2020       Reactions   Lipitor [atorvastatin] Other (See Comments)   Leg pains        Medication List        Accurate as of December 19, 2020  3:42 PM. If you have any questions, ask your nurse or doctor.          Aimovig 140 MG/ML Soaj Generic drug: Erenumab-aooe Inject into the skin.   ALPRAZolam 1 MG tablet Commonly known as: XANAX Take 1 tablet by mouth twice daily   aspirin 81 MG EC tablet Take 1 tablet (81 mg total) by mouth daily. Swallow whole.   Baclofen 5 MG Tabs Take 1 tablet by mouth 2 (two) times daily as needed.   Baqsimi One Pack 3 MG/DOSE Powd Generic drug:  Glucagon Place 1 spray into both nostrils as directed.   buPROPion ER 100 MG 12 hr tablet Commonly known as: WELLBUTRIN SR Take 100 mg by mouth 2 (two) times daily.   carvedilol 12.5 MG tablet Commonly known as: COREG Take 1 tablet by mouth twice daily   clopidogrel 75 MG tablet Commonly known as: PLAVIX Take 1 tablet (75 mg total) by mouth daily.   desvenlafaxine 50 MG 24 hr tablet Commonly known as: PRISTIQ Take 50 mg by mouth daily.   Desvenlafaxine ER 50 MG Tb24 Commonly known as: PRISTIQ Take 1 tablet (50 mg total) by mouth every morning.   diclofenac Sodium 1 % Gel Commonly known as: VOLTAREN Apply 2 g topically 4 (four) times daily.   diphenoxylate-atropine 2.5-0.025 MG tablet Commonly known as: Lomotil Take 1 tablet by mouth 4 (four) times daily  as needed for diarrhea or loose stools.   estradiol 0.1 MG/GM vaginal cream Commonly known as: ESTRACE Estrogen Cream Instruction Discard applicator Apply pea sized amount to tip of finger to urethra before bed. Wash hands well after application. Use Monday, Wednesday and Friday   furosemide 20 MG tablet Commonly known as: LASIX Take 1 tablet by mouth every day as needed for swelling   gabapentin 300 MG capsule Commonly known as: NEURONTIN Take 2 capsules (600 mg total) by mouth 2 (two) times daily.   glucose 4 GM chewable tablet Chew 1 tablet (4 g total) by mouth 3 (three) times daily.   isosorbide mononitrate 120 MG 24 hr tablet Commonly known as: IMDUR Take 1 tablet by mouth every day   levothyroxine 25 MCG tablet Commonly known as: SYNTHROID Take 1 tablet by mouth 30 minutes before breakfast   nitroGLYCERIN 0.4 MG SL tablet Commonly known as: NITROSTAT Place 1 tablet (0.4 mg total) under the tongue every 5 (five) minutes as needed for chest pain. Do not exceed 3 dose within 30 mins   pantoprazole 40 MG tablet Commonly known as: PROTONIX Take 1 tablet (40 mg total) by mouth 2 (two) times daily.    promethazine 25 MG tablet Commonly known as: PHENERGAN Take 1 tablet (25 mg total) by mouth every 6 (six) hours as needed for nausea or vomiting.   rosuvastatin 40 MG tablet Commonly known as: CRESTOR Take 1 tablet (40 mg total) by mouth daily at 6 PM.   SYSTANE OP Place 1 drop into both eyes daily as needed (dry eyes).   traZODone 100 MG tablet Commonly known as: DESYREL Take 1 to 2 tablets by mouth at bedtime       Allergies:  Allergies  Allergen Reactions   Lipitor [Atorvastatin] Other (See Comments)    Leg pains   Family History: Family History  Problem Relation Age of Onset   Hypertension Mother    Anxiety disorder Mother    Depression Mother    Bipolar disorder Mother    Heart disease Mother        No details   Hyperlipidemia Mother    Kidney disease Father    Heart disease Father 21   Hypertension Father    Diabetes Father    Stroke Father    Colon cancer Father        dx in his 49's   Anxiety disorder Father    Depression Father    Skin cancer Father    Kidney disease Sister    Thyroid nodules Sister    Hypertension Sister    Hypertension Sister    Diabetes Sister    Hyperlipidemia Sister    Depression Sister    Breast cancer Maternal Aunt 64   Breast cancer Maternal Aunt 57   Ovarian cancer Cousin    Colon cancer Cousin    Kidney cancer Cousin    Breast cancer Other    Bladder Cancer Neg Hx    Social History:   reports that she quit smoking about 26 years ago. Her smoking use included cigarettes. She has a 0.25 pack-year smoking history. She has never used smokeless tobacco. She reports that she does not drink alcohol and does not use drugs.  Physical Exam: BP 134/78   Pulse 66   Temp 98 F (36.7 C) (Oral)   Ht $R'5\' 3"'hc$  (1.6 m)   Wt 159 lb (72.1 kg)   BMI 28.17 kg/m   Constitutional:  Alert and oriented, no acute  distress, nontoxic appearing HEENT: [place], AT Cardiovascular: No clubbing, cyanosis, or edema Respiratory: Normal respiratory  effort, no increased work of breathing Skin: No rashes, bruises or suspicious lesions Neurologic: Grossly intact, no focal deficits, moving all 4 extremities Psychiatric: Normal mood and affect  Laboratory Data: Results for orders placed or performed in visit on 12/19/20  Microscopic Examination   Urine  Result Value Ref Range   WBC, UA 0-5 0 - 5 /hpf   RBC None seen 0 - 2 /hpf   Epithelial Cells (non renal) 0-10 0 - 10 /hpf   Casts Present None seen /lpf   Cast Type Hyaline casts N/A   Bacteria, UA Many (A) None seen/Few  Urinalysis, Complete  Result Value Ref Range   Specific Gravity, UA 1.010 1.005 - 1.030   pH, UA 5.5 5.0 - 7.5   Color, UA Yellow Yellow   Appearance Ur Clear Clear   Leukocytes,UA Negative Negative   Protein,UA Negative Negative/Trace   Glucose, UA Negative Negative   Ketones, UA Negative Negative   RBC, UA Negative Negative   Bilirubin, UA Negative Negative   Urobilinogen, Ur 0.2 0.2 - 1.0 mg/dL   Nitrite, UA Negative Negative   Microscopic Examination See below:    *Note: Due to a large number of results and/or encounters for the requested time period, some results have not been displayed. A complete set of results can be found in Results Review.   Assessment & Plan:   1. Dysuria Chronic. Cath UA today with bacteriuria consistent with colonization, especially in the absence of pyuria. Will send for culture and treat based on results given plans for cystoscopy, however I do not think her symptoms represent true UTI today.  Will adjust upcoming appointment with Dr. Matilde Sprang to include cystoscopy as this was originally in his treatment plan for her and I was able to discuss with her today. Patient is in agreement with this plan. - Urinalysis, Complete - CULTURE, URINE COMPREHENSIVE  Return in about 2 weeks (around 01/02/2021) for cystoscopy and UDS results with Dr. Matilde Sprang.  Debroah Loop, PA-C  [facility] [address], [place], [place] [address] [phone]

## 2020-12-20 ENCOUNTER — Telehealth: Payer: Self-pay

## 2020-12-20 ENCOUNTER — Ambulatory Visit (INDEPENDENT_AMBULATORY_CARE_PROVIDER_SITE_OTHER): Payer: Medicare Other | Admitting: Family Medicine

## 2020-12-20 ENCOUNTER — Encounter: Payer: Self-pay | Admitting: Family Medicine

## 2020-12-20 ENCOUNTER — Other Ambulatory Visit: Payer: Self-pay

## 2020-12-20 VITALS — BP 136/78 | HR 51 | Temp 97.8°F | Wt 157.0 lb

## 2020-12-20 DIAGNOSIS — I10 Essential (primary) hypertension: Secondary | ICD-10-CM | POA: Diagnosis not present

## 2020-12-20 DIAGNOSIS — E1122 Type 2 diabetes mellitus with diabetic chronic kidney disease: Secondary | ICD-10-CM

## 2020-12-20 DIAGNOSIS — N183 Chronic kidney disease, stage 3 unspecified: Secondary | ICD-10-CM

## 2020-12-20 DIAGNOSIS — I6930 Unspecified sequelae of cerebral infarction: Secondary | ICD-10-CM | POA: Diagnosis not present

## 2020-12-20 DIAGNOSIS — I129 Hypertensive chronic kidney disease with stage 1 through stage 4 chronic kidney disease, or unspecified chronic kidney disease: Secondary | ICD-10-CM

## 2020-12-20 DIAGNOSIS — D6859 Other primary thrombophilia: Secondary | ICD-10-CM | POA: Diagnosis not present

## 2020-12-20 DIAGNOSIS — K219 Gastro-esophageal reflux disease without esophagitis: Secondary | ICD-10-CM

## 2020-12-20 MED ORDER — PANTOPRAZOLE SODIUM 40 MG PO TBEC: 40.0000 mg | DELAYED_RELEASE_TABLET | Freq: Two times a day (BID) | ORAL | 1 refills | Status: DC

## 2020-12-20 NOTE — Progress Notes (Deleted)
Established patient visit   Patient: [name]   DOB: [birth year]   50 y.o. Female  MRN: [id number] Visit Date: 12/20/2020  Today's healthcare provider: [name], MD   No chief complaint on file.  Subjective    HPI  Follow up Hospitalization  Patient was admitted to [facility] on 12/05/2020 and discharged on 12/10/2020. She was treated for altered mental status, UTI and CVA. Treatment for this included see hospital note. Telephone follow up was done on 12/16/2020 She reports excellent compliance with treatment. She reports this condition is improved.   Recommendations for Outpatient Follow-up:  Follow outpatient CBC/CMP Follow pending hypercoagulable panel Continue blood pressure control outpatient Needs prompt GI follow up for  CT abd/pelvis showing 1.3 cm hyperdense focus in proximal duodenum as well as reported history of bleeding noted about 2 weeks prior to this hospitalization (no bleeding noted during this hospitalization and stable Hb at time of discharge) Follow with neurology outpatient, she wants to establish with [place] neurologist  Discharged on DAPT and PPI BID ----------------------------------------------------------------------------------------- -     Medications: Outpatient Medications Prior to Visit  Medication Sig   AIMOVIG 140 MG/ML SOAJ Inject into the skin.   ALPRAZolam (XANAX) 1 MG tablet Take 1 tablet by mouth twice daily   aspirin EC 81 MG EC tablet Take 1 tablet (81 mg total) by mouth daily. Swallow whole.   Baclofen 5 MG TABS Take 1 tablet by mouth 2 (two) times daily as needed.   BAQSIMI ONE PACK 3 MG/DOSE POWD Place 1 spray into both nostrils as directed.   buPROPion (WELLBUTRIN SR) 100 MG 12 hr tablet Take 100 mg by mouth 2 (two) times daily.   carvedilol (COREG) 12.5 MG tablet Take 1 tablet by mouth twice daily   clopidogrel (PLAVIX) 75 MG tablet Take 1 tablet (75 mg total) by mouth daily.   desvenlafaxine (PRISTIQ) 50 MG 24  hr tablet Take 50 mg by mouth daily.   Desvenlafaxine ER (PRISTIQ) 50 MG TB24 Take 1 tablet (50 mg total) by mouth every morning.   diclofenac Sodium (VOLTAREN) 1 % GEL Apply 2 g topically 4 (four) times daily.   diphenoxylate-atropine (LOMOTIL) 2.5-0.025 MG tablet Take 1 tablet by mouth 4 (four) times daily as needed for diarrhea or loose stools.   estradiol (ESTRACE) 0.1 MG/GM vaginal cream Estrogen Cream Instruction Discard applicator Apply pea sized amount to tip of finger to urethra before bed. Wash hands well after application. Use Monday, Wednesday and Friday   furosemide (LASIX) 20 MG tablet Take 1 tablet by mouth every day as needed for swelling   gabapentin (NEURONTIN) 300 MG capsule Take 2 capsules (600 mg total) by mouth 2 (two) times daily.   glucose 4 GM chewable tablet Chew 1 tablet (4 g total) by mouth 3 (three) times daily.   isosorbide mononitrate (IMDUR) 120 MG 24 hr tablet Take 1 tablet by mouth every day   levothyroxine (SYNTHROID) 25 MCG tablet Take 1 tablet by mouth 30 minutes before breakfast   nitroGLYCERIN (NITROSTAT) 0.4 MG SL tablet Place 1 tablet (0.4 mg total) under the tongue every 5 (five) minutes as needed for chest pain. Do not exceed 3 dose within 30 mins   pantoprazole (PROTONIX) 40 MG tablet Take 1 tablet (40 mg total) by mouth 2 (two) times daily.   Polyethyl Glycol-Propyl Glycol (SYSTANE OP) Place 1 drop into both eyes daily as needed (dry eyes).   promethazine (PHENERGAN) 25 MG tablet Take 1  tablet (25 mg total) by mouth every 6 (six) hours as needed for nausea or vomiting.   rosuvastatin (CRESTOR) 40 MG tablet Take 1 tablet (40 mg total) by mouth daily at 6 PM.   traZODone (DESYREL) 100 MG tablet Take 1 to 2 tablets by mouth at bedtime   No facility-administered medications prior to visit.    Review of Systems  Constitutional: Negative.   Cardiovascular:  Positive for leg swelling. Negative for chest pain and palpitations.  Gastrointestinal:  Positive  for constipation. Negative for abdominal distention, abdominal pain, anal bleeding, blood in stool, diarrhea, nausea, rectal pain and vomiting.  Genitourinary:  Negative for decreased urine volume, difficulty urinating, dyspareunia, dysuria, flank pain, frequency, hematuria, urgency, vaginal bleeding, vaginal discharge and vaginal pain.  Neurological:  Negative for dizziness, light-headedness and headaches.      Objective    There were no vitals taken for this visit.   Physical Exam  ***  No results found for any visits on 12/20/20.  Assessment & Plan     ***  No follow-ups on file.      {provider attestation***:1}   [name], MD  [facility] [phone] (phone) [phone] (fax)  [place]

## 2020-12-20 NOTE — Progress Notes (Signed)
Established patient visit   Patient: [name]   DOB: 1970/02/10   50 y.o. Female  MRN: [id number] Visit Date: 12/20/2020  Today's healthcare provider: [name], MD   No chief complaint on file.  Subjective    HPI  Follow up Hospitalization  Patient was admitted to [facility] on 12/05/2020 and discharged on 12/10/2020. She was treated for altered mental status, UTI and CVA.  She has long history of multiple CVA, CAD and carotid artery disease. She was discharged on DUAP and states she feels she is now back to her baseline. She had coagulation work up that remarkable only for mildly elevated anticardiolipin IgM.Telephone follow up was done on 12/16/2020 She reports excellent compliance with treatment. She was previously followed by Dr. Manuella Ghazi but wants to change neurologist and has appointment scheduled with Dr. Addison Lank on 12-16.2022 Had follow up Dr. [name] 11-16 and amlodipine put on hold due to low blood pressure of 100/70   Recommendations for Outpatient Follow-up:  Follow outpatient CBC/CMP Follow pending hypercoagulable panel Continue blood pressure control outpatient Needs prompt GI follow up for  CT abd/pelvis showing 1.3 cm hyperdense focus in proximal duodenum as well as reported history of bleeding noted about 2 weeks prior to this hospitalization (no bleeding noted during this hospitalization and stable Hb at time of discharge) Follow with neurology outpatient, she wants to establish with [place] neurologist  Discharged on DAPT and PPI BID ----------------------------------------------------------------------------------------- -     Medications: Outpatient Medications Prior to Visit  Medication Sig   AIMOVIG 140 MG/ML SOAJ Inject into the skin.   ALPRAZolam (XANAX) 1 MG tablet Take 1 tablet by mouth twice daily   aspirin EC 81 MG EC tablet Take 1 tablet (81 mg total) by mouth daily. Swallow whole.   Baclofen 5 MG TABS Take 1 tablet by mouth 2 (two)  times daily as needed.   BAQSIMI ONE PACK 3 MG/DOSE POWD Place 1 spray into both nostrils as directed.   buPROPion (WELLBUTRIN SR) 100 MG 12 hr tablet Take 100 mg by mouth 2 (two) times daily.   carvedilol (COREG) 12.5 MG tablet Take 1 tablet by mouth twice daily   clopidogrel (PLAVIX) 75 MG tablet Take 1 tablet (75 mg total) by mouth daily.   desvenlafaxine (PRISTIQ) 50 MG 24 hr tablet Take 50 mg by mouth daily.   Desvenlafaxine ER (PRISTIQ) 50 MG TB24 Take 1 tablet (50 mg total) by mouth every morning.   diclofenac Sodium (VOLTAREN) 1 % GEL Apply 2 g topically 4 (four) times daily.   diphenoxylate-atropine (LOMOTIL) 2.5-0.025 MG tablet Take 1 tablet by mouth 4 (four) times daily as needed for diarrhea or loose stools.   estradiol (ESTRACE) 0.1 MG/GM vaginal cream Estrogen Cream Instruction Discard applicator Apply pea sized amount to tip of finger to urethra before bed. Wash hands well after application. Use Monday, Wednesday and Friday   furosemide (LASIX) 20 MG tablet Take 1 tablet by mouth every day as needed for swelling   gabapentin (NEURONTIN) 300 MG capsule Take 2 capsules (600 mg total) by mouth 2 (two) times daily.   glucose 4 GM chewable tablet Chew 1 tablet (4 g total) by mouth 3 (three) times daily.   isosorbide mononitrate (IMDUR) 120 MG 24 hr tablet Take 1 tablet by mouth every day   levothyroxine (SYNTHROID) 25 MCG tablet Take 1 tablet by mouth 30 minutes before breakfast   nitroGLYCERIN (NITROSTAT) 0.4 MG SL tablet Place 1 tablet (0.4  mg total) under the tongue every 5 (five) minutes as needed for chest pain. Do not exceed 3 dose within 30 mins   Polyethyl Glycol-Propyl Glycol (SYSTANE OP) Place 1 drop into both eyes daily as needed (dry eyes).   promethazine (PHENERGAN) 25 MG tablet Take 1 tablet (25 mg total) by mouth every 6 (six) hours as needed for nausea or vomiting.   rosuvastatin (CRESTOR) 40 MG tablet Take 1 tablet (40 mg total) by mouth daily at 6 PM.   traZODone  (DESYREL) 100 MG tablet Take 1 to 2 tablets by mouth at bedtime   [DISCONTINUED] pantoprazole (PROTONIX) 40 MG tablet Take 1 tablet (40 mg total) by mouth 2 (two) times daily.   No facility-administered medications prior to visit.    Review of Systems  Constitutional: Negative.   Cardiovascular:  Positive for leg swelling. Negative for chest pain and palpitations.  Gastrointestinal:  Positive for constipation. Negative for abdominal distention, abdominal pain, anal bleeding, blood in stool, diarrhea, nausea, rectal pain and vomiting.  Genitourinary:  Negative for decreased urine volume, difficulty urinating, dyspareunia, dysuria, flank pain, frequency, hematuria, urgency, vaginal bleeding, vaginal discharge and vaginal pain.  Neurological:  Negative for dizziness, light-headedness and headaches.      Objective    BP 136/78 (BP Location: Left Arm, Patient Position: Sitting, Cuff Size: Normal)   Pulse (!) 51   Temp 97.8 F (36.6 C) (Oral)   Wt 157 lb (71.2 kg)   SpO2 100%   BMI 27.81 kg/m    Physical Exam   General: Appearance:     Well developed, well nourished female in no acute distress  Eyes:    PERRL, conjunctiva/corneas clear, EOM's intact       Lungs:     Clear to auscultation bilaterally, respirations unlabored  Heart:    Bradycardic. Irregularly irregular rhythm. No murmurs, rubs, or gallops.    MS:   All extremities are intact.    Neurologic:   Awake, alert, oriented x 3. No apparent focal neurological defect.          Assessment & Plan     1. History of CVA with residual deficit Now on DUAP, neurologically back to baseline. On high intensity statin. Scheduled to establish with Dr. Krista Blue at [facility] in December. Continue home physical therapy.   2. Hypercoagulable state ([place]) Mildly elevated anticardiolipin IgM, will repeat at follow up in about 2 months.   3. Essential hypertension Fairly well controlled, intolerant to amlodipine. Continue current medications for  now. Follow up cardiology as scheduled.   4. Hypertension associated with stage 3 chronic kidney disease due to type 2 diabetes mellitus ([place])  - CBC - Comprehensive metabolic panel  5. Gastroesophageal reflux disease without esophagitis Well controlled.  Continue current medications.   - pantoprazole (PROTONIX) 40 MG tablet; Take 1 tablet (40 mg total) by mouth 2 (two) times daily.  Dispense: 180 tablet; Refill: 1         The entirety of the information documented in the History of Present Illness, Review of Systems and Physical Exam were personally obtained by me. Portions of this information were initially documented by the CMA and reviewed by me for thoroughness and accuracy.     [name], MD  [facility] [phone] (phone) [phone] (fax)  [place]

## 2020-12-20 NOTE — Progress Notes (Signed)
Chronic Care Management Pharmacy Assistant   Name: [name]  MRN: [id number] DOB: Oct 20, 1970  Reason for [place] Call.   Recent office visits:  12/20/2020 Dr.Fisher MD (PCP) No Medication changes noted 11/06/2020 Dr.Fisher MD (PCP)  No Medication changes noted, Ambulatory referral to Endocrinology 10/30/2020 [name] CMA (PCP Office) Medicare wellness completed  Recent consult visits:  12/19/2020 Kendra Loop PA (Urology) No Medication changes noted, return in 2 weeks 12/18/2020 Dr.Gollan MD (Cardiology) Hold amlodipine for now 11/28/2020 Dr. Holley Raring MD (Nephrology) No medication Changes noted, return in 4 months 11/26/2020 Dr. Grayland Ormond MD (Oncology) No Medication Changes noted, return in 3 months 11/25/2020 Kendra Hippo PA (Orthopedic Surgery) No Medication Changes noted 11/20/2020 Kendra Kathlen Mody PA-C (Cardiology) Start Furosemide 20 mg prn, Follow up in 6 months 11/13/2020 Dr. Manuella Ghazi MD (Neurology) Start Baclofen 5 mg 1 tablet 2 times daily as needed, discontinue ubrogepant 100 mg 11/04/2020 Dr. Matilde Sprang MD (Urology) No medication changes noted  [name] visits:  Medication Reconciliation was completed by comparing discharge summary, patient's EMR and Pharmacy list, and upon discussion with patient.  [name] on 12/05/2020 due to Altered Mental Status. Discharge date was 12/10/2020. Discharged from Standard?Medications Started at [facility] [name] Discharge:?? -started None  Medication Changes at [name] Discharge: -Changed None  Medications Discontinued at [name] Discharge: -Stopped memantine ,tramadol ,Ubrelvy   Medications that remain the same after [name] Discharge:??  -All other medications will remain the same.    Medications: Outpatient Encounter Medications as of 12/20/2020  Medication Sig   AIMOVIG 140 MG/ML SOAJ Inject into the skin.   ALPRAZolam (XANAX) 1 MG  tablet Take 1 tablet by mouth twice daily   aspirin EC 81 MG EC tablet Take 1 tablet (81 mg total) by mouth daily. Swallow whole.   Baclofen 5 MG TABS Take 1 tablet by mouth 2 (two) times daily as needed.   BAQSIMI ONE PACK 3 MG/DOSE POWD Place 1 spray into both nostrils as directed.   buPROPion (WELLBUTRIN SR) 100 MG 12 hr tablet Take 100 mg by mouth 2 (two) times daily.   carvedilol (COREG) 12.5 MG tablet Take 1 tablet by mouth twice daily   clopidogrel (PLAVIX) 75 MG tablet Take 1 tablet (75 mg total) by mouth daily.   desvenlafaxine (PRISTIQ) 50 MG 24 hr tablet Take 50 mg by mouth daily.   Desvenlafaxine ER (PRISTIQ) 50 MG TB24 Take 1 tablet (50 mg total) by mouth every morning.   diclofenac Sodium (VOLTAREN) 1 % GEL Apply 2 g topically 4 (four) times daily.   diphenoxylate-atropine (LOMOTIL) 2.5-0.025 MG tablet Take 1 tablet by mouth 4 (four) times daily as needed for diarrhea or loose stools.   estradiol (ESTRACE) 0.1 MG/GM vaginal cream Estrogen Cream Instruction Discard applicator Apply pea sized amount to tip of finger to urethra before bed. Wash hands well after application. Use Monday, Wednesday and Friday   furosemide (LASIX) 20 MG tablet Take 1 tablet by mouth every day as needed for swelling   gabapentin (NEURONTIN) 300 MG capsule Take 2 capsules (600 mg total) by mouth 2 (two) times daily.   glucose 4 GM chewable tablet Chew 1 tablet (4 g total) by mouth 3 (three) times daily.   isosorbide mononitrate (IMDUR) 120 MG 24 hr tablet Take 1 tablet by mouth every day   levothyroxine (SYNTHROID) 25 MCG tablet Take 1 tablet by mouth 30 minutes before breakfast   nitroGLYCERIN (NITROSTAT) 0.4 MG SL  tablet Place 1 tablet (0.4 mg total) under the tongue every 5 (five) minutes as needed for chest pain. Do not exceed 3 dose within 30 mins   pantoprazole (PROTONIX) 40 MG tablet Take 1 tablet (40 mg total) by mouth 2 (two) times daily.   Polyethyl Glycol-Propyl Glycol (SYSTANE OP) Place 1 drop  into both eyes daily as needed (dry eyes).   promethazine (PHENERGAN) 25 MG tablet Take 1 tablet (25 mg total) by mouth every 6 (six) hours as needed for nausea or vomiting.   rosuvastatin (CRESTOR) 40 MG tablet Take 1 tablet (40 mg total) by mouth daily at 6 PM.   traZODone (DESYREL) 100 MG tablet Take 1 to 2 tablets by mouth at bedtime   No facility-administered encounter medications on file as of 12/20/2020.    Care Gaps: Shingrix Vaccine Pneumococcal Vaccine (Last Completed 03/24/2014) Urine Microalbumin (Last Completed 10/14/2016) COVID-19 Vaccine (4- Booster for pfizer series)  Foot Exam (Last Completed 07/26/2019) Ophthalmology Exam (Last Completed 11/29/2019) Star Rating Drugs: Rosuvastatin 40 mg last filled on 01/19/2020 for 90 day supply at [facility]. Medication Fill Gaps: None ID  Reviewed chart prior to disease state call. Spoke with patient regarding BP  Recent Office Vitals: BP Readings from Last 3 Encounters:  12/20/20 136/78  12/19/20 133/70  12/19/20 134/78   Pulse Readings from Last 3 Encounters:  12/20/20 (!) 51  12/19/20 61  12/19/20 66    Wt Readings from Last 3 Encounters:  12/20/20 157 lb (71.2 kg)  12/19/20 159 lb (72.1 kg)  12/18/20 159 lb 6 oz (72.3 kg)     Kidney Function Lab Results  Component Value Date/Time   CREATININE 1.84 (H) 12/10/2020 07:57 AM   CREATININE 1.68 (H) 12/09/2020 08:09 AM   CREATININE 1.92 (H) 10/14/2016 04:29 PM   CREATININE 1.75 (H) 12/30/2013 03:55 AM   CREATININE 1.89 (H) 12/29/2013 11:06 PM   GFR 52.73 (L) 05/20/2011 02:49 PM   GFRNONAA 33 (L) 12/10/2020 07:57 AM   GFRNONAA 30 (L) 10/14/2016 04:29 PM   GFRAA 29 (L) 03/13/2020 09:52 AM   GFRAA 35 (L) 10/14/2016 04:29 PM    BMP Latest Ref Rng & Units 12/10/2020 12/09/2020 12/07/2020  Glucose 70 - 99 mg/dL 97 82 90  BUN 6 - 20 mg/dL 45(H) 31(H) 29(H)  Creatinine 0.44 - 1.00 mg/dL 1.84(H) 1.68(H) 1.89(H)  BUN/Creat Ratio 9 - 23 - - -  Sodium 135 - 145  mmol/L 138 142 137  Potassium 3.5 - 5.1 mmol/L 4.5 4.2 4.5  Chloride 98 - 111 mmol/L 106 105 104  CO2 22 - 32 mmol/L 24 28 26   Calcium 8.9 - 10.3 mg/dL 9.0 8.9 8.5(L)    Current antihypertensive regimen:  Carvedilol 12.5 mg twice daily  Imdur 120 mg daily  How often are you checking your Blood Pressure? infrequently Current home BP readings:  Patient states her blood pressure has been ranging around 130's/70's. What recent interventions/DTPs have been made by any provider to improve Blood Pressure control since last CPP Visit:  12/18/2020 Dr.Gollan MD (Cardiology) Hold amlodipine for now Any recent hospitalizations or ED visits since last visit with CPP? No What diet changes have been made to improve Blood Pressure Control?  Patient denies any changes to her diet. What exercise is being done to improve your Blood Pressure Control?  Patient denies any changes with her exercise.  Adherence Review: Is the patient currently on ACE/ARB medication? No Does the patient have >5 day gap between last estimated fill dates?  No  Anderson [place] Clinical Production designer, theatre/television/film [phone]

## 2020-12-21 LAB — COMPREHENSIVE METABOLIC PANEL
ALT: 30 IU/L (ref 0–32)
AST: 40 IU/L (ref 0–40)
Albumin/Globulin Ratio: 1.7 (ref 1.2–2.2)
Albumin: 4.2 g/dL (ref 3.8–4.9)
Alkaline Phosphatase: 144 IU/L — ABNORMAL HIGH (ref 44–121)
BUN/Creatinine Ratio: 12 (ref 9–23)
BUN: 26 mg/dL — ABNORMAL HIGH (ref 6–24)
Bilirubin Total: <0.2 mg/dL (ref 0.0–1.2)
CO2: 24 mmol/L (ref 20–29)
Calcium: 9.1 mg/dL (ref 8.7–10.2)
Chloride: 102 mmol/L (ref 96–106)
Creatinine, Ser: 2.15 mg/dL — ABNORMAL HIGH (ref 0.57–1.00)
Globulin, Total: 2.5 g/dL (ref 1.5–4.5)
Glucose: 69 mg/dL — ABNORMAL LOW (ref 70–99)
Potassium: 4.2 mmol/L (ref 3.5–5.2)
Sodium: 143 mmol/L (ref 134–144)
Total Protein: 6.7 g/dL (ref 6.0–8.5)
eGFR: 27 mL/min/{1.73_m2} — ABNORMAL LOW (ref >59)

## 2020-12-21 LAB — CBC
Hematocrit: 37.3 % (ref 34.0–46.6)
Hemoglobin: 11.9 g/dL (ref 11.1–15.9)
MCH: 25.5 pg — ABNORMAL LOW (ref 26.6–33.0)
MCHC: 31.9 g/dL (ref 31.5–35.7)
MCV: 80 fL (ref 79–97)
Platelets: 184 10*3/uL (ref 150–450)
RBC: 4.66 x10E6/uL (ref 3.77–5.28)
RDW: 14.2 % (ref 11.7–15.4)
WBC: 7.3 10*3/uL (ref 3.4–10.8)

## 2020-12-23 LAB — CULTURE, URINE COMPREHENSIVE

## 2020-12-24 ENCOUNTER — Telehealth: Payer: Self-pay

## 2020-12-24 ENCOUNTER — Ambulatory Visit: Payer: Self-pay

## 2020-12-24 DIAGNOSIS — R300 Dysuria: Secondary | ICD-10-CM

## 2020-12-24 DIAGNOSIS — N390 Urinary tract infection, site not specified: Secondary | ICD-10-CM

## 2020-12-24 MED ORDER — TRAMADOL HCL 50 MG PO TABS: 50.0000 mg | ORAL_TABLET | Freq: Four times a day (QID) | ORAL | 2 refills | Status: DC | PRN

## 2020-12-24 MED ORDER — FOSFOMYCIN TROMETHAMINE 3 G PO PACK: 3.0000 g | PACK | Freq: Once | ORAL | 0 refills | Status: AC

## 2020-12-24 NOTE — Telephone Encounter (Signed)
[name], PT with [facility] reports pt. Is unable to do PT due to pain in legs. Has had Tramadol in the past and is requesting medication. States pt.'s gabapentin "isn't helping her much." Send to [employer]. Please advise pt. Thanks,   Answer Assessment - Initial Assessment Questions 1. NAME of MEDICATION: "What medicine are you calling about?"     Asking for Tramadol 2. QUESTION: "What is your question?" (e.g., double dose of medicine, side effect)     Can she be prescribed this medicine 3. PRESCRIBING HCP: "Who prescribed it?" Reason: if prescribed by specialist, call should be referred to that group.     N/a 4. SYMPTOMS: "Do you have any symptoms?"     No 5. SEVERITY: If symptoms are present, ask "Are they mild, moderate or severe?"     N/a 6. PREGNANCY:  "Is there any chance that you are pregnant?" "When was your last menstrual period?"     No  Protocols used: Medication Question Call-A-AH

## 2020-12-24 NOTE — Telephone Encounter (Signed)
Notified patient of message below. Patient states she has used GoodRx and is familiar with how to use it. Fosfomycin sent to pt's pharmacy and appt with Dr Matilde Sprang confirmed.

## 2020-12-24 NOTE — Telephone Encounter (Signed)
-----   Message from [place], [place] sent at 12/24/2020  4:00 PM EST ----- Unfortunately her catheterized urine culture has grown out a bacteria that is extremely difficult to treat. The lab did not test this bacteria against fosfomycin, but I think it is likely it would respond to this antibiotic. Unfortunately there are no other oral antibiotics available to treat it.  I recommend sending in empiric fosfomycin x1 dose to treat this culture. Please counsel her on cost and available GoodRx coupons. She should follow up with Dr. Matilde Sprang as scheduled. If her symptoms do not improve on fosfomycin, she will likely require ID referral for IV antibiotics. Will defer to Dr. Matilde Sprang on this. ----- Message ----- From: Interface, [facility] Lab Results In Sent: 12/19/2020   4:37 PM EST To: Debroah Loop, PA-C

## 2020-12-24 NOTE — Telephone Encounter (Signed)
The patient's home health provider has called requesting a prescription for patient's leg discomfort   The patient is attempting to begin physical therapy but continues to experience significant discomfort in their legs preventing it   The patient shares that they were previously prescribed tramadol but were directed by their cardiologist to stop the medication   The patient would like to be prescribed something to minimize discomfort and assist in their mobility   Please contact the patient further when possible   Left message to call back.

## 2020-12-25 ENCOUNTER — Ambulatory Visit: Payer: Medicare Other

## 2020-12-26 ENCOUNTER — Other Ambulatory Visit: Payer: Self-pay | Admitting: Cardiovascular Disease

## 2020-12-30 ENCOUNTER — Other Ambulatory Visit: Payer: Medicare Other | Admitting: Urology

## 2020-12-30 ENCOUNTER — Ambulatory Visit: Payer: Medicare Other | Admitting: Family Medicine

## 2020-12-30 ENCOUNTER — Other Ambulatory Visit: Payer: Self-pay

## 2020-12-30 ENCOUNTER — Ambulatory Visit (INDEPENDENT_AMBULATORY_CARE_PROVIDER_SITE_OTHER): Payer: Medicare Other | Admitting: Urology

## 2020-12-30 DIAGNOSIS — N3946 Mixed incontinence: Secondary | ICD-10-CM | POA: Diagnosis not present

## 2020-12-30 MED ORDER — NITROFURANTOIN MACROCRYSTAL 100 MG PO CAPS: 100.0000 mg | ORAL_CAPSULE | Freq: Two times a day (BID) | ORAL | 11 refills | Status: DC

## 2020-12-30 NOTE — Addendum Note (Signed)
Addended by: Verlene Mayer A on: 12/30/2020 12:10 PM   Modules accepted: Orders

## 2020-12-30 NOTE — Progress Notes (Deleted)
Patient ID: [name], female   DOB: [birth year], 50 y.o.   MRN:   .vir

## 2020-12-30 NOTE — Progress Notes (Signed)
Virtual Visit via Telephone Note  I connected with [name] on 12/30/20 at 11:30 AM EST by telephone and verified that I am speaking with the correct person using two identifiers.   Patient location: Home Provider location: [facility]   I discussed the limitations, risks, security and privacy concerns of performing an evaluation and management service by telephone and the availability of in person appointments. We discussed the impact of the COVID-19 pandemic on the healthcare system, and the importance of social distancing and reducing patient and provider exposure. I also discussed with the patient that there may be a patient responsible charge related to this service. The patient expressed understanding and agreed to proceed.  Reason for visit: Reviewed note.  Patient has complicated incontinence with neurogenic bladder.  She self catheterizes 4 times a day.  She has a functional component.  She has leakage without awareness bedwetting and urge incontinence.  She had urodynamics in [facility] and bladder capacity 756 mL.  Bladder was unstable reaching pressure of 18 cmH2O with moderate leakage.  No stress incontinence with Valsalva pressure of 96 cm water.  Did.  She did voluntarily void.  Maximum voiding pressure 30 cm of water.  Maximal flow 25 mils per second.  Residual was 374 mL.  She voided 382 mL.  She did some straining and Crede maneuver to void.  EMG activity was increased during the voiding phase.  Bladder was mildly trabeculated.  Bladder neck descent at 1 cm  Patient was recently hospitalized with urinary tract infection with cephalopathy.  She says urine is cloudy now.  No longer on daily Macrodantin and she said it was working before   History of Present Illness: See above note   Assessment and Plan: Call in Macrodantin 100 mg 3x11.  See patient in 6 or 7 weeks for pelvic semination cystoscopy and proceed accordingly.  Need to sterilize her  urine to reach treatment goal regarding urinary incontinence.  Medication of Botox are options.   Follow Up: Noted above    I discussed the assessment and treatment plan with the patient. The patient was provided an opportunity to ask questions and all were answered. The patient agreed with the plan and demonstrated an understanding of the instructions.   The patient was advised to call back or seek an in-person evaluation if the symptoms worsen or if the condition fails to improve as anticipated.  I provided 15 minutes of non-face-to-face time during this encounter.   Reece Packer, MD

## 2020-12-30 NOTE — Progress Notes (Signed)
This service is provided via telemedicine   No vital signs collected/recorded due to the encounter was a telemedicine visit.     Patient consents to a telephone visit:  Yes, all medications, pharmacy and history were reviewed.     Names of all persons participating in the telemedicine service and their role in the encounter:  Verlene Mayer, [place]

## 2021-01-01 ENCOUNTER — Telehealth: Payer: Self-pay | Admitting: Neurology

## 2021-01-01 IMAGING — CT CT ANGIO CHEST
2 of 6 series · 17 of 46 positions shown · IV contrast (APPLIED)
Comparison: 05/31/2015

CLINICAL DATA: Shortness of breath

EXAM:
CT ANGIOGRAPHY CHEST WITH CONTRAST
TECHNIQUE: Multidetector CT imaging of the chest was performed using the
standard protocol during bolus administration of intravenous
contrast. Multiplanar CT image reconstructions and MIPs were
obtained to evaluate the vascular anatomy.
CONTRAST:  60mL OMNIPAQUE IOHEXOL 350 MG/ML SOLN

[Series 5: thins · axial · 0.61mm/px · z∈[-331,-88]mm · 15 of 267 slices shown]
[im 12/267  lung]
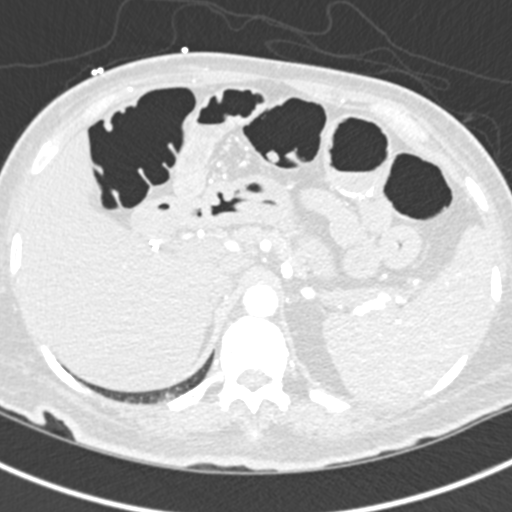
[im 35/267  soft-tissue]
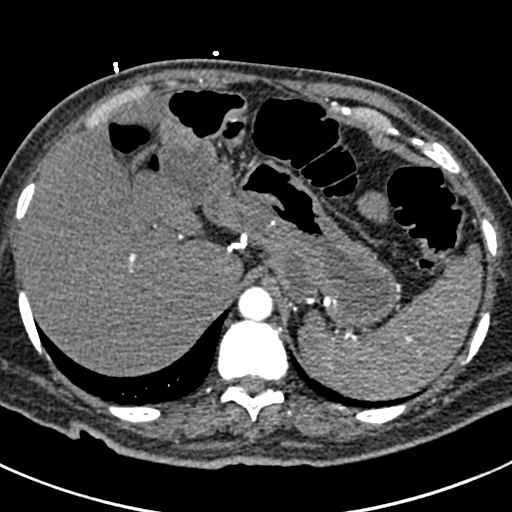
[im 47/267  lung]
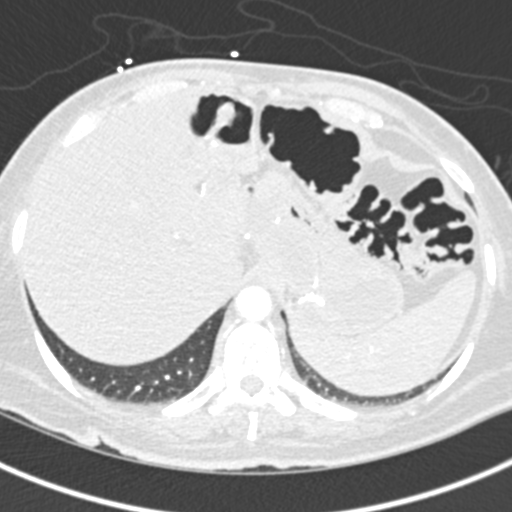
[im 70/267  soft-tissue]
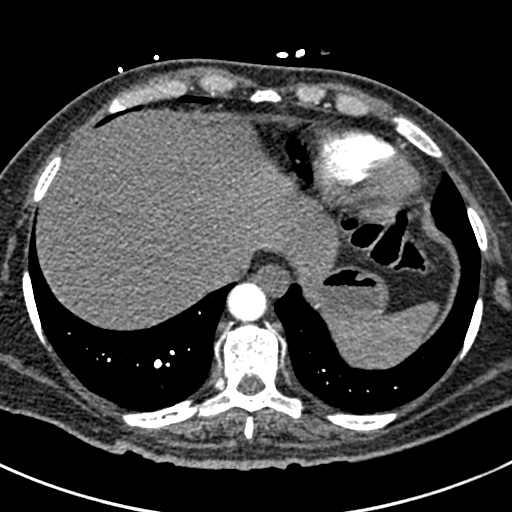
[im 81/267  lung]
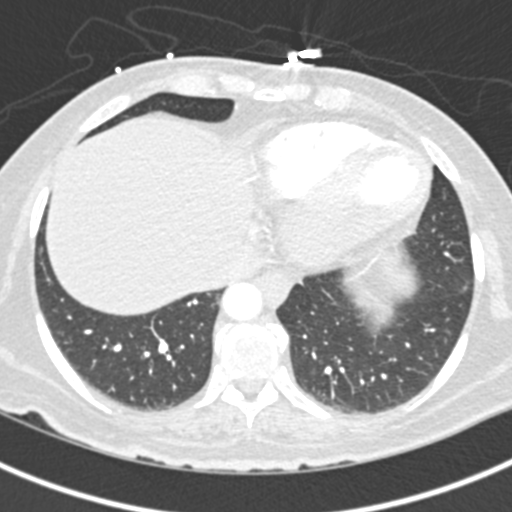
[im 105/267  soft-tissue]
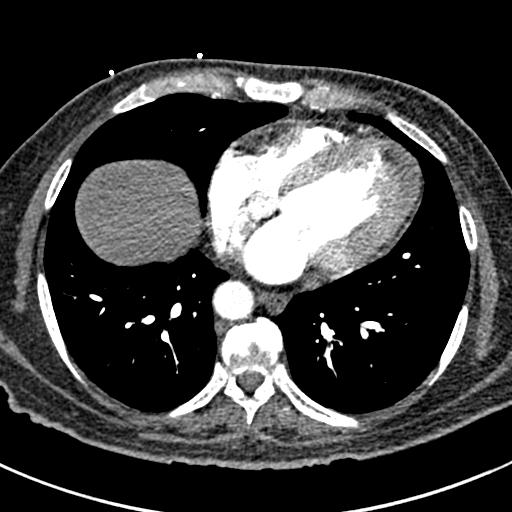
[im 116/267  lung]
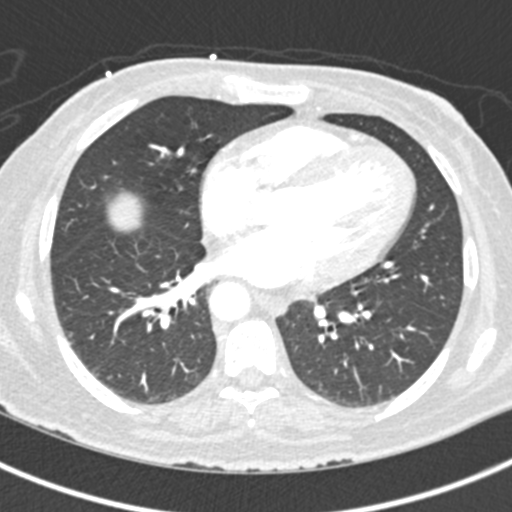
[im 139/267  soft-tissue]
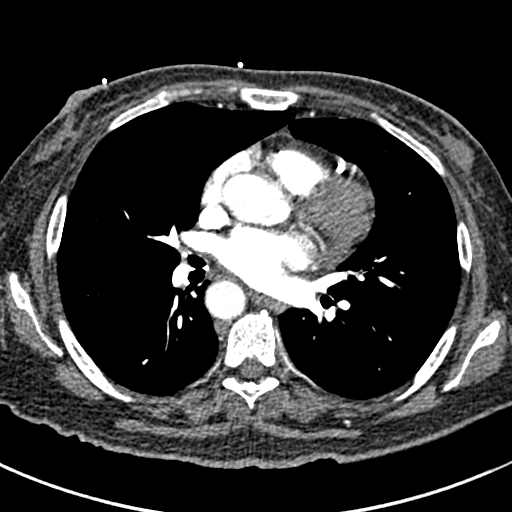
[im 151/267  lung]
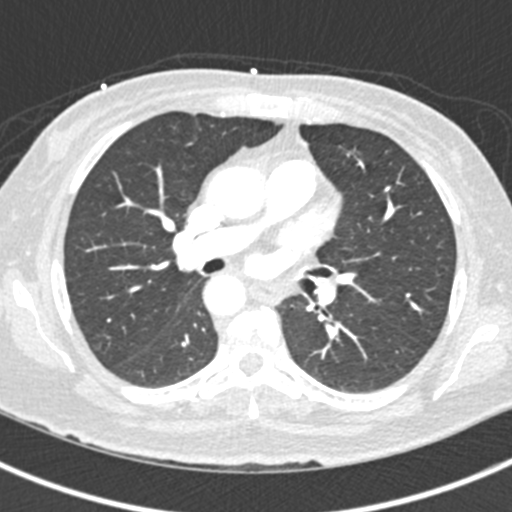
[im 162/267  soft-tissue]
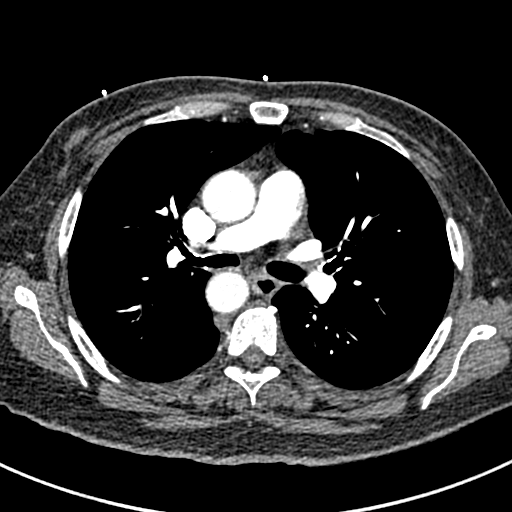
[im 186/267  lung]
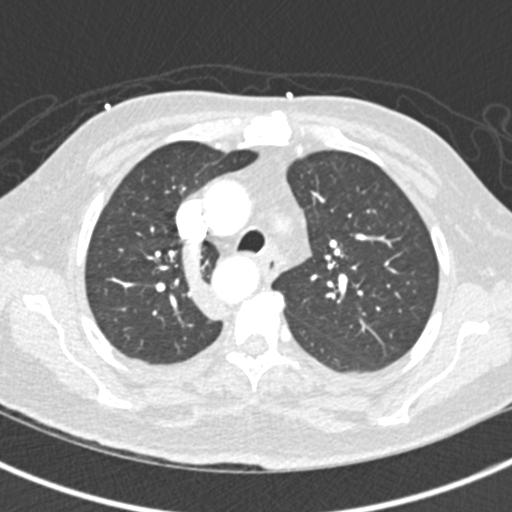
[im 197/267  soft-tissue]
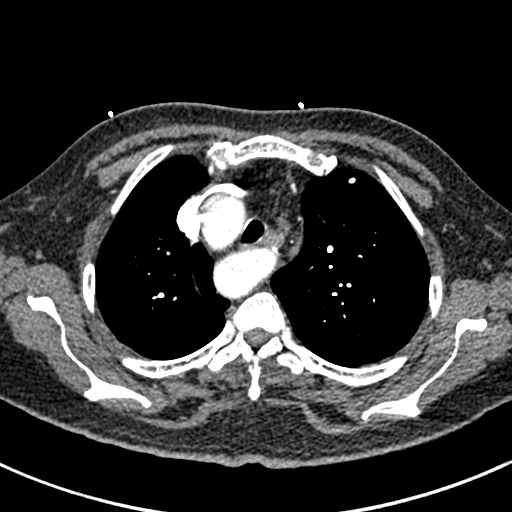
[im 220/267  lung]
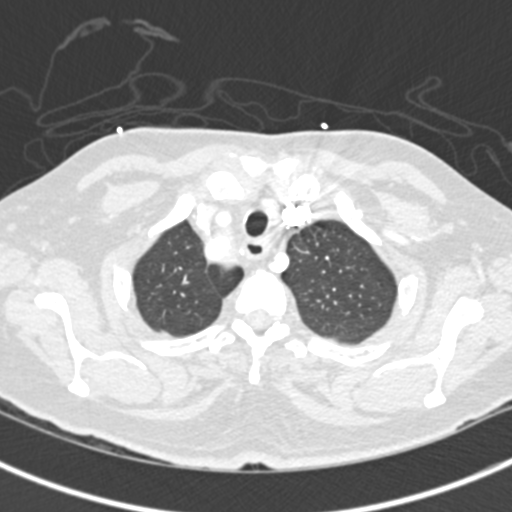
[im 232/267  soft-tissue]
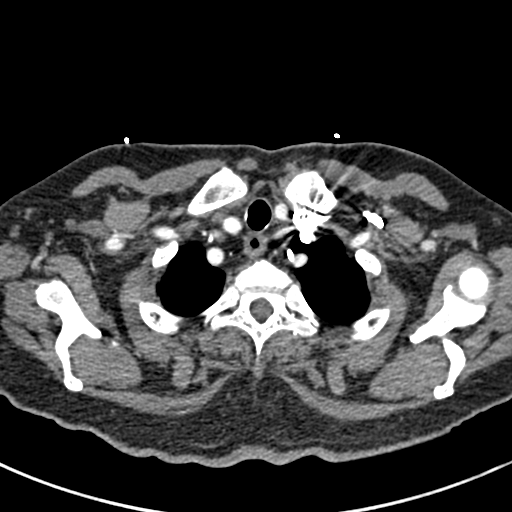
[im 255/267  lung]
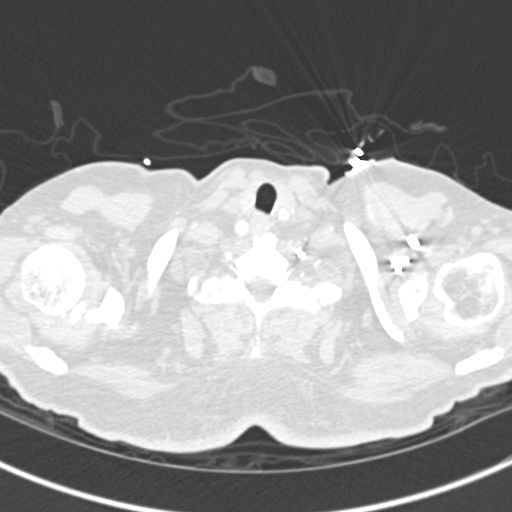

[Series 7: coronal mpr · coronal · 0.53mm/px · 2 of 87 slices shown]
[im 29/87  soft-tissue]
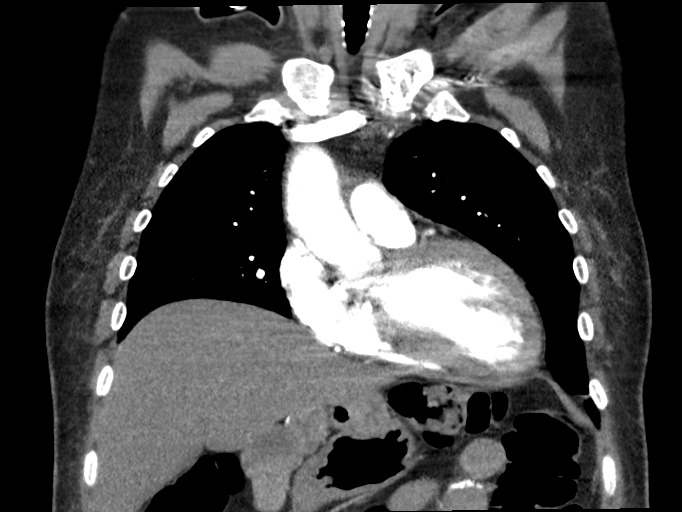
[im 58/87  soft-tissue]
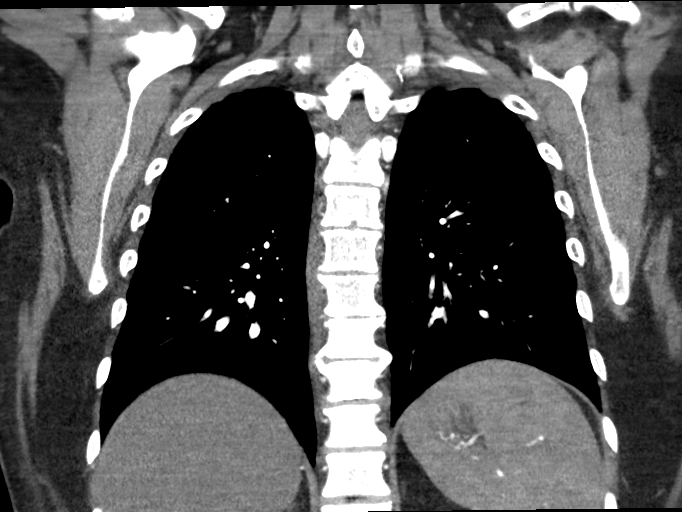

[17 of 46 positions shown; findings below may reference images not displayed]

FINDINGS: Cardiovascular: Contrast injection is sufficient to demonstrate
satisfactory opacification of the pulmonary arteries to the
segmental level. There is no pulmonary embolus. The main pulmonary
artery is within normal limits for size. There is no CT evidence of
acute right heart strain. There are mild atherosclerotic changes of
the visualized thoracic aorta. There is a right-sided aortic arch
with an aberrant left subclavian artery which has been embolized.
There is a bypass graft from the left common carotid artery to the
left subclavian artery. The visualized portions of the left
subclavian artery are grossly patent beyond the level of the
anastomosis. The appearance of this finding is similar to prior CT.
Heart size is mildly enlarged. Coronary artery calcifications are
noted. There is no significant pericardial effusion.

Mediastinum/Nodes:

--No mediastinal or hilar lymphadenopathy.

--No axillary lymphadenopathy.

--No supraclavicular lymphadenopathy.

--the left hemi thyroid appears to be either surgically absent or
atretic.

--The esophagus is unremarkable

Lungs/Pleura: No pulmonary nodules or masses. No pleural effusion or
pneumothorax. No focal airspace consolidation. No focal pleural
abnormality.

Upper Abdomen: The patient appears to be status post prior gastric
bypass. The patient is status post prior cholecystectomy. The upper
abdomen is otherwise unremarkable.

Musculoskeletal: No chest wall abnormality. No acute or significant
osseous findings.

Review of the MIP images confirms the above findings.
IMPRESSION: 1. No evidence for pulmonary embolus or other acute intrathoracic
process.
2. Right-sided aortic arch with an aberrant left subclavian artery
which has been embolized. There is a bypass graft from the left
common carotid artery to the left subclavian artery. The visualized
portions of the left subclavian artery are grossly patent beyond the
level of the anastomosis. Overall, this is stable since 0151.

Aortic Atherosclerosis ([id number]-IZ6.6).

## 2021-01-01 IMAGING — CR DG CHEST 2V
2 series · 2 of 2 positions shown · non-contrast
Comparison: 10/12/2018

CLINICAL DATA: Pt c/o CP since [REDACTED] that is pressure like in
nature and some SOB. Pt states called her [facility] and was advised to come
to the ED. Hx of CHF, stage 3 kidney failure.

EXAM:
CHEST - 2 VIEW

[chest pa]
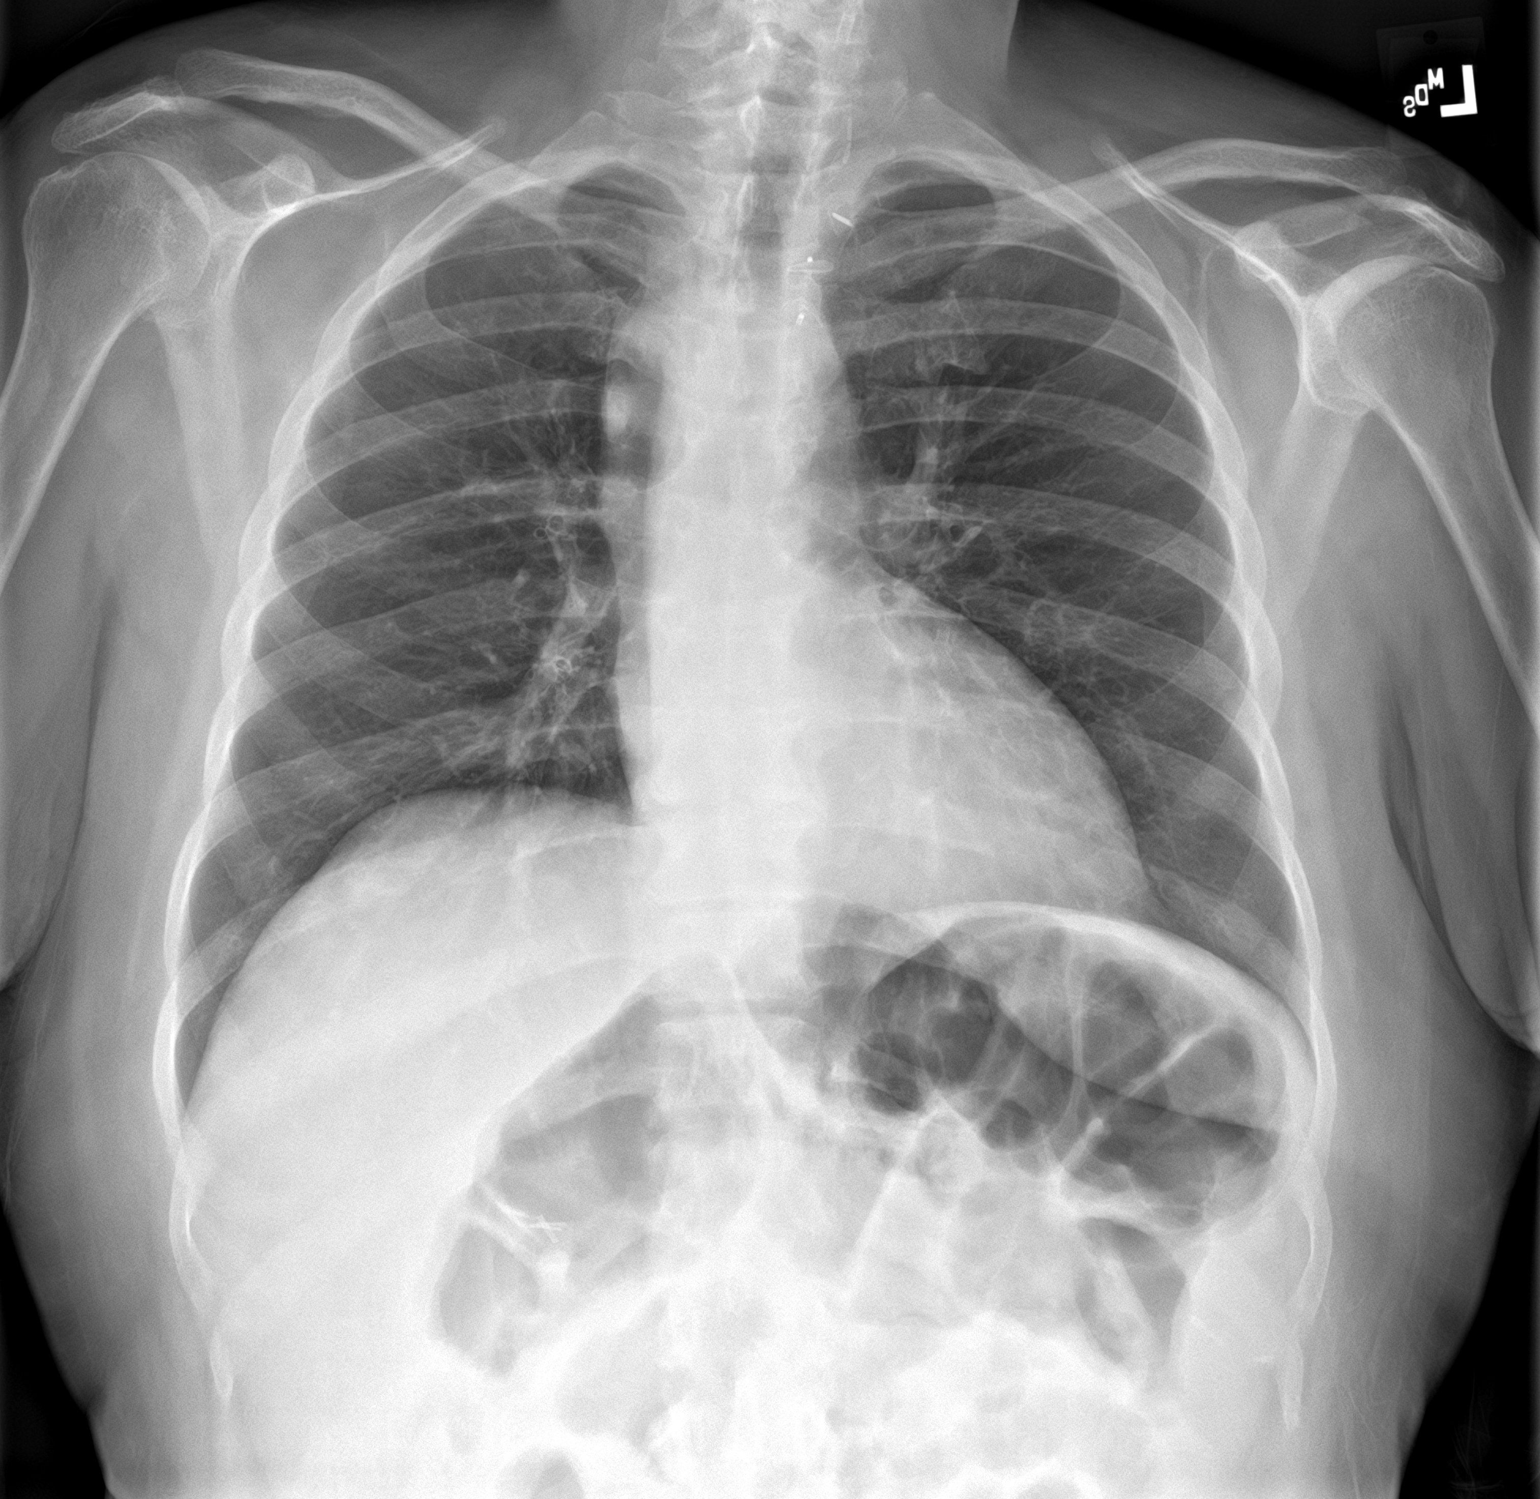

[chest lat]
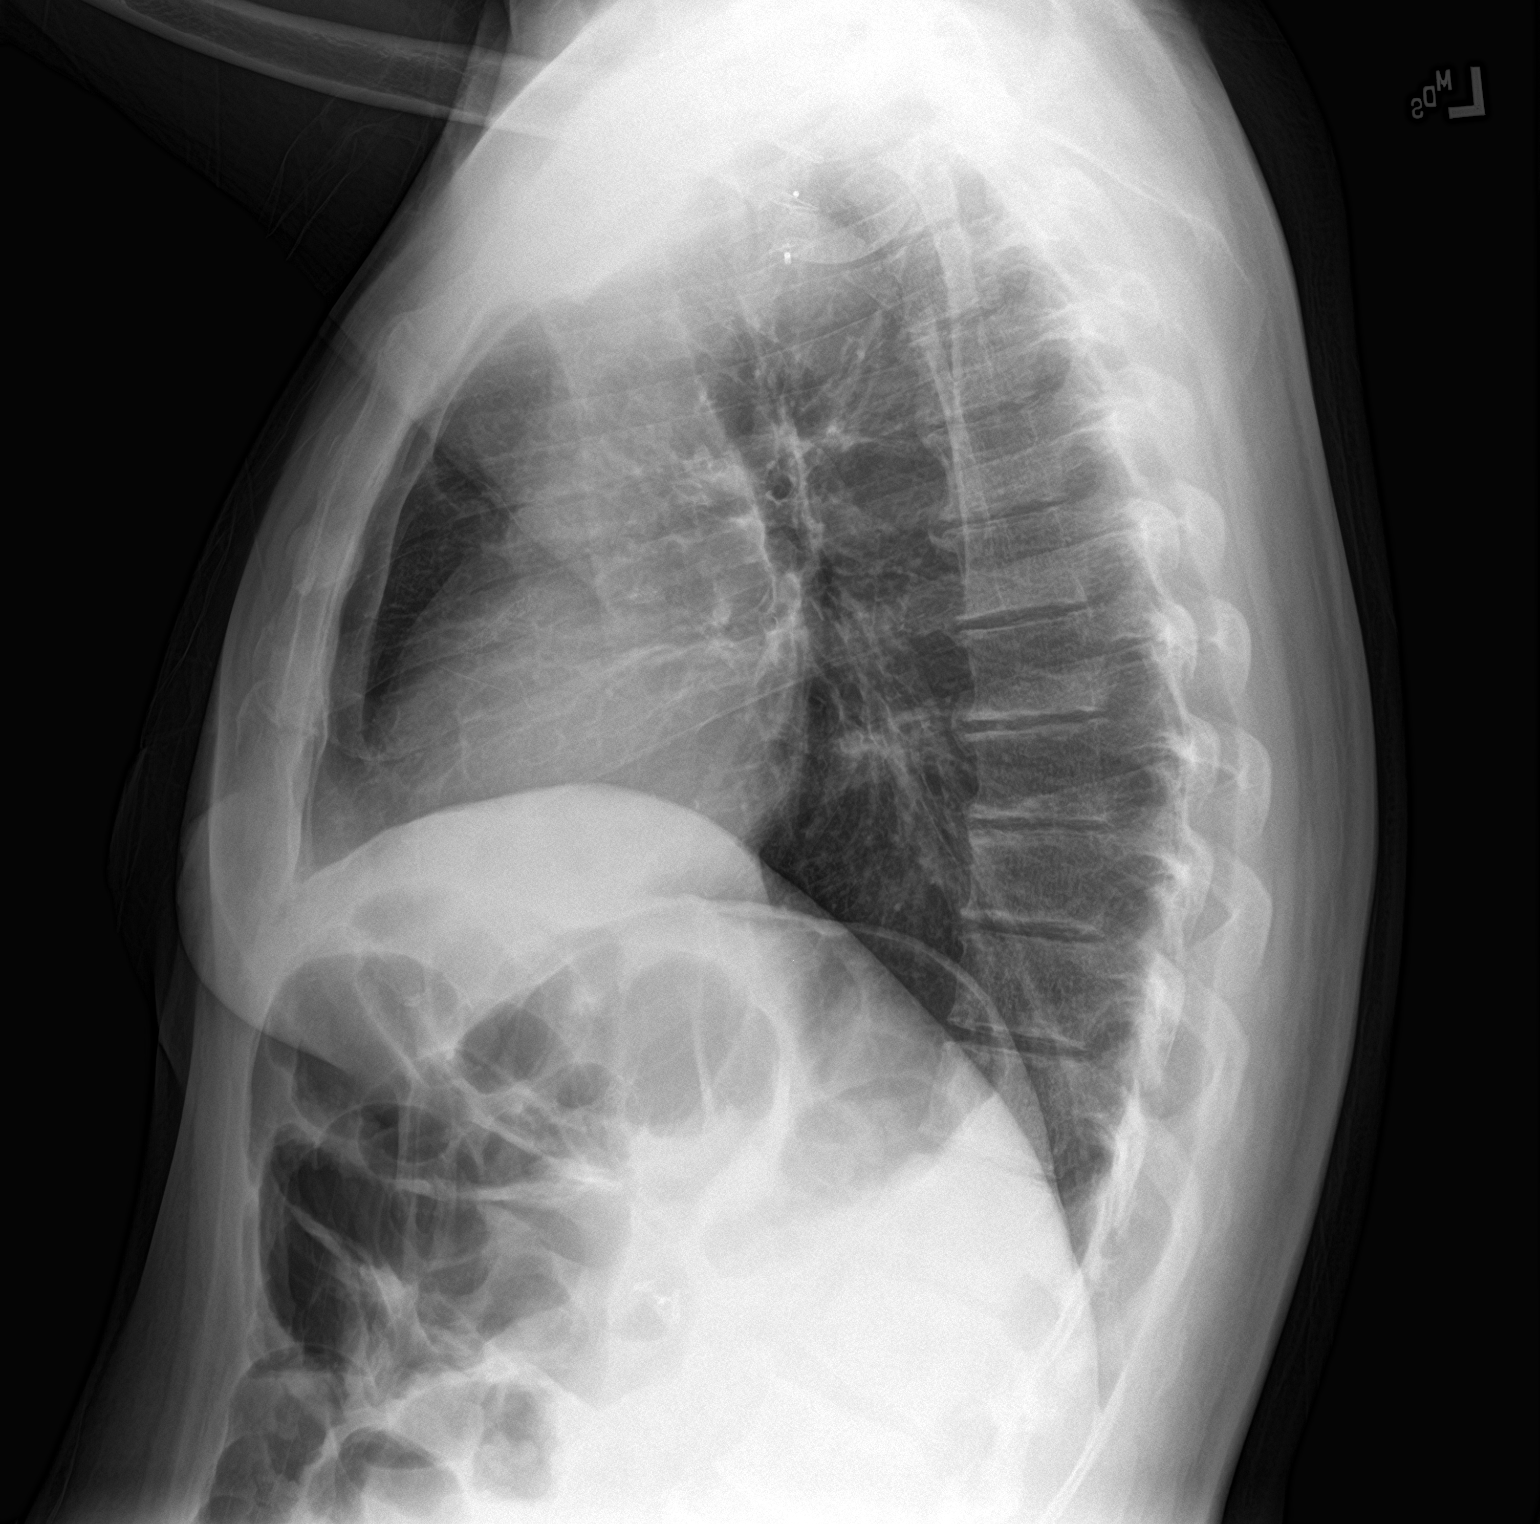

[2 of 2 positions shown; findings below may reference images not displayed]

FINDINGS: Cardiac silhouette is normal in size. No mediastinal or hilar
masses. No evidence of adenopathy.

Clear lungs.  No pleural effusion or pneumothorax.

Skeletal structures are intact.
IMPRESSION: No active cardiopulmonary disease.

## 2021-01-01 NOTE — Telephone Encounter (Signed)
[name]) reporting pt hit her head on door. No injuries, just a little sore. Wanted to see if pt can be seen sooner than 01/17/21. Would like a call from the nurse.  Have put pt on the waitlist.

## 2021-01-02 ENCOUNTER — Other Ambulatory Visit: Payer: Self-pay

## 2021-01-02 ENCOUNTER — Other Ambulatory Visit: Payer: Self-pay | Admitting: Gastroenterology

## 2021-01-02 ENCOUNTER — Ambulatory Visit (INDEPENDENT_AMBULATORY_CARE_PROVIDER_SITE_OTHER): Payer: Medicare Other | Admitting: Gastroenterology

## 2021-01-02 ENCOUNTER — Other Ambulatory Visit: Payer: Self-pay | Admitting: Cardiovascular Disease

## 2021-01-02 ENCOUNTER — Encounter: Payer: Self-pay | Admitting: Gastroenterology

## 2021-01-02 VITALS — BP 167/80 | HR 70 | Temp 97.1°F | Ht 63.0 in | Wt 160.4 lb

## 2021-01-02 DIAGNOSIS — R933 Abnormal findings on diagnostic imaging of other parts of digestive tract: Secondary | ICD-10-CM | POA: Diagnosis not present

## 2021-01-02 NOTE — Progress Notes (Signed)
Primary Care Physician: [name], MD  Primary Gastroenterologist:  Dr. [name]  Chief Complaint  Patient presents with   Follow-up    Hospitalization    HPI: [name] is a 50 y.o. female here For follow-up after having been found to have a duodenal lesion on the CT scan.  The patient had a GI bleed at [facility] with a spiral endoscopy and a double balloon enteroscopy due to the source of bleeding not being seen in the patient having a history of gastric bypass surgery.  The patient had been discharged and followed up at [facility] a short time later and had a repeat EGD with an anastomotic ulcer that was treated by me and the patient has not had any further bleeding.  The patient reports that she recently had a stroke.  The patient now comes in with a finding of a lesion just distal to the bypass stomach and the duodenum.  It was reported that this was at the site where her previous bleeding scan had been positive.  I spoke to radiology today to review the CT scan and to establish that it is actually in the duodenum postpyloric.  They did confirm that it was past the Roux-en-Y limb.  Past Medical History:  Diagnosis Date   Acute colitis 01/27/2017   Acute pyelonephritis    Acute upper GI bleed 01/25/2020   Anemia    iron deficiency anemia   Aortic arch aneurysm    BRCA negative 2014   CAD (coronary artery disease)    a. 08/2003 Cath: LAD 30-40-med Rx; b. 11/2014 PCI: LAD 34m(3.25x23 Xience Alpine DES); c. 06/2015 PCI: D1 (2.25x12 Resolute Integrity DES); d. 06/2017 PCI: Patent mLAD stent, D2 95 (PTCA); e. 09/2017 PCI: D2 99ost (CBA); d. 12/2017 Cath: LM nl, LAD 322m80d (small), D1 40ost, D2 95ost, LCX 40p, RCA 40ost/p->Med rx for D2 given restenosis.   Closed nondisplaced intertrochanteric fracture of left femur (HCPassaic9/10/2018   Colitis 06/03/2015   Colon polyp    CVA (cerebral vascular accident) (HCAberdeen   Left side weakness.    Degenerative tear of glenoid labrum  of right shoulder 03/15/2017   Diabetes mellitus without complication (HCPlatter   Family history of breast cancer    BRCA neg 2014   Femur fracture, left (HCCadillac9/10/2018   Gastric ulcer 04/02/89/4782 Helicobacter pylori infection 11/23/2014   History of echocardiogram    a. 03/2017 Echo: EF 60-65%, no rwma; b. 02/2018 Echo: EF 60-65%, no rwma. Nl RV fxn. No cardiac source of emboli (admitted w/ stroke).   Hypertension    Malignant melanoma of skin of scalp (HCC)    MI, acute, non ST segment elevation (HCC)    Neuromuscular disorder (HCC)    S/P drug eluting coronary stent placement 06/04/2015   Sepsis (HCWarren2/14/2019   Sepsis secondary to UTI (HCWyldwood3/22/2022   Stroke (HCTime   a. 02/2018 MRI: 28m32mate acute/early subacute L medial frontal lobe inarct; b. 02/2018 MRA No large vessel occlusion or aneurysm. Mod to sev L P2 stenosis. thready L vertebral artery, diffusely dzs'd; c. 02/2018 Carotid U/S: <50% bilat ICA dzs.    Current Outpatient Medications  Medication Sig Dispense Refill   AIMOVIG 140 MG/ML SOAJ Inject into the skin.     ALPRAZolam (XANAX) 1 MG tablet Take 1 tablet by mouth twice daily 60 tablet 5   aspirin EC 81 MG EC tablet Take 1 tablet (81  mg total) by mouth daily. Swallow whole. 30 tablet 1   Baclofen 5 MG TABS Take 1 tablet by mouth 2 (two) times daily as needed.     BAQSIMI ONE PACK 3 MG/DOSE POWD Place 1 spray into both nostrils as directed.     buPROPion (WELLBUTRIN SR) 100 MG 12 hr tablet Take 100 mg by mouth 2 (two) times daily.     carvedilol (COREG) 12.5 MG tablet Take 1 tablet by mouth twice daily 60 tablet 11   clopidogrel (PLAVIX) 75 MG tablet Take 1 tablet (75 mg total) by mouth daily. 90 tablet 3   desvenlafaxine (PRISTIQ) 50 MG 24 hr tablet Take 50 mg by mouth daily.     Desvenlafaxine ER (PRISTIQ) 50 MG TB24 Take 1 tablet (50 mg total) by mouth every morning. 90 tablet 3   diclofenac Sodium (VOLTAREN) 1 % GEL Apply 2 g topically 4 (four) times daily.      diphenoxylate-atropine (LOMOTIL) 2.5-0.025 MG tablet Take 1 tablet by mouth 4 (four) times daily as needed for diarrhea or loose stools. 120 tablet 5   estradiol (ESTRACE) 0.1 MG/GM vaginal cream Estrogen Cream Instruction Discard applicator Apply pea sized amount to tip of finger to urethra before bed. Wash hands well after application. Use Monday, Wednesday and Friday 42.5 g 12   furosemide (LASIX) 20 MG tablet Take 1 tablet by mouth every day as needed for swelling 30 tablet 6   gabapentin (NEURONTIN) 300 MG capsule Take 2 capsules (600 mg total) by mouth 2 (two) times daily. 360 capsule 4   glucose 4 GM chewable tablet Chew 1 tablet (4 g total) by mouth 3 (three) times daily. 50 tablet 12   isosorbide mononitrate (IMDUR) 120 MG 24 hr tablet Take 1 tablet by mouth every day 30 tablet 1   levothyroxine (SYNTHROID) 25 MCG tablet Take 1 tablet by mouth 30 minutes before breakfast 30 tablet 5   nitrofurantoin (MACRODANTIN) 100 MG capsule Take 1 capsule (100 mg total) by mouth 2 (two) times daily. 30 capsule 11   nitroGLYCERIN (NITROSTAT) 0.4 MG SL tablet Dissolve 1 tablet under tongue every 8mns, up to 3 doses, for chest pain. Max 3 doses in 358ms. 25 tablet 11   pantoprazole (PROTONIX) 40 MG tablet Take 1 tablet (40 mg total) by mouth 2 (two) times daily. 180 tablet 1   Polyethyl Glycol-Propyl Glycol (SYSTANE OP) Place 1 drop into both eyes daily as needed (dry eyes).     promethazine (PHENERGAN) 25 MG tablet Take 1 tablet (25 mg total) by mouth every 6 (six) hours as needed for nausea or vomiting. 30 tablet 11   rosuvastatin (CRESTOR) 40 MG tablet Take 1 tablet (40 mg total) by mouth daily at 6 PM. 90 tablet 3   traMADol (ULTRAM) 50 MG tablet Take 1 tablet (50 mg total) by mouth every 6 (six) hours as needed. 30 tablet 2   traZODone (DESYREL) 100 MG tablet Take 1 to 2 tablets by mouth at bedtime 30 tablet 5   No current facility-administered medications for this visit.    Allergies as of  01/02/2021 - Review Complete 01/02/2021  Allergen Reaction Noted   Lipitor [atorvastatin] Other (See Comments) 04/14/2017    ROS:  General: Negative for anorexia, weight loss, fever, chills, fatigue, weakness. ENT: Negative for hoarseness, difficulty swallowing , nasal congestion. CV: Negative for chest pain, angina, palpitations, dyspnea on exertion, peripheral edema.  Respiratory: Negative for dyspnea at rest, dyspnea on exertion, cough, sputum, wheezing.  GI:  See history of present illness. GU:  Negative for dysuria, hematuria, urinary incontinence, urinary frequency, nocturnal urination.  Endo: Negative for unusual weight change.    Physical Examination:   BP (!) 167/80 (BP Location: Left Arm, Patient Position: Sitting, Cuff Size: Small)   Pulse 70   Temp (!) 97.1 F (36.2 C) (Temporal)   Ht '5\' 3"'  (1.6 m)   Wt 160 lb 6.4 oz (72.8 kg)   BMI 28.41 kg/m   General: Well-nourished, well-developed in no acute distress.  Eyes: No icterus. Conjunctivae pink. Neuro: Alert and oriented x 3.  Grossly intact. Skin: Warm and dry, no jaundice.   Psych: Alert and cooperative, normal mood and affect.  Labs:    Imaging Studies: CT ANGIO HEAD NECK W WO CM  Result Date: 12/05/2020 CLINICAL DATA:  Confusion EXAM: CT ANGIOGRAPHY HEAD AND NECK TECHNIQUE: Multidetector CT imaging of the head and neck was performed using the standard protocol during bolus administration of intravenous contrast. Multiplanar CT image reconstructions and MIPs were obtained to evaluate the vascular anatomy. Carotid stenosis measurements (when applicable) are obtained utilizing NASCET criteria, using the distal internal carotid diameter as the denominator. CONTRAST:  62m OMNIPAQUE IOHEXOL 350 MG/ML SOLN COMPARISON:  None. FINDINGS: CTA NECK FINDINGS SKELETON: There is no bony spinal canal stenosis. No lytic or blastic lesion. OTHER NECK: Normal pharynx, larynx and major salivary glands. No cervical lymphadenopathy.  Unremarkable thyroid gland. UPPER CHEST: No pneumothorax or pleural effusion. No nodules or masses. AORTIC ARCH: Right-sided aortic arch. RIGHT CAROTID SYSTEM: No dissection, occlusion or aneurysm. Mild atherosclerotic calcification at the carotid bifurcation without hemodynamically significant stenosis. LEFT CAROTID SYSTEM: Normal without aneurysm, dissection or stenosis. VERTEBRAL ARTERIES: Right dominant configuration. Both origins are clearly patent. Diffusely diminutive left vertebral artery. CTA HEAD FINDINGS POSTERIOR CIRCULATION: --Vertebral arteries: Atherosclerotic calcification of the right V4 segment with moderate narrowing. --Inferior cerebellar arteries: Normal. --Basilar artery: Normal. --Superior cerebellar arteries: Normal. --Posterior cerebral arteries (PCA): Normal. ANTERIOR CIRCULATION: --Intracranial internal carotid arteries: Atherosclerotic calcification of the internal carotid arteries at the skull base without hemodynamically significant stenosis. --Anterior cerebral arteries (ACA): Normal. Both A1 segments are present. Patent anterior communicating artery (a-comm). --Middle cerebral arteries (MCA): Normal. VENOUS SINUSES: As permitted by contrast timing, patent. ANATOMIC VARIANTS: None Review of the MIP images confirms the above findings. IMPRESSION: 1. No emergent large vessel occlusion. 2. Moderate narrowing of the right vertebral artery V4 segment secondary to atherosclerotic calcification. 3. Right-sided aortic arch. Electronically Signed   By: KUlyses JarredM.D.   On: 12/05/2020 22:01   CT HEAD WO CONTRAST  Result Date: 12/05/2020 CLINICAL DATA:  Altered mental status. EXAM: CT HEAD WITHOUT CONTRAST TECHNIQUE: Contiguous axial images were obtained from the base of the skull through the vertex without intravenous contrast. COMPARISON:  August 07, 2020 FINDINGS: Brain: There is mild cerebral atrophy with widening of the extra-axial spaces and ventricular dilatation. There are areas of  decreased attenuation within the white matter tracts of the supratentorial brain, consistent with microvascular disease changes. Small chronic bilateral basal ganglia lacunar infarcts are seen. Vascular: No hyperdense vessel or unexpected calcification. Skull: Normal. Negative for fracture or focal lesion. Sinuses/Orbits: No acute finding. Other: None. IMPRESSION: 1. Generalized cerebral atrophy. 2. Small chronic bilateral basal ganglia lacunar infarcts. 3. No acute intracranial abnormality. Electronically Signed   By: TVirgina NorfolkM.D.   On: 12/05/2020 19:24   CT Cervical Spine Wo Contrast  Result Date: 12/05/2020 CLINICAL DATA:  Neck trauma, dangerous injury mechanism (Age 7-64y) EXAM:  CT CERVICAL SPINE WITHOUT CONTRAST TECHNIQUE: Multidetector CT imaging of the cervical spine was performed without intravenous contrast. Multiplanar CT image reconstructions were also generated. COMPARISON:  10/12/2018 FINDINGS: Alignment: Normal Skull base and vertebrae: No acute fracture. No primary bone lesion or focal pathologic process. Soft tissues and spinal canal: No prevertebral fluid or swelling. No visible canal hematoma. Disc levels: Large flowing anterior osteophytes. Disc spaces maintained. Upper chest: No acute findings Other: None IMPRESSION: Degenerative spurring anteriorly. No acute bony abnormality. Electronically Signed   By: Rolm Baptise M.D.   On: 12/05/2020 21:33   MR BRAIN WO CONTRAST  Result Date: 12/05/2020 CLINICAL DATA:  Encephalopathy and weakness EXAM: MRI HEAD WITHOUT CONTRAST TECHNIQUE: Multiplanar, multiecho pulse sequences of the brain and surrounding structures were obtained without intravenous contrast. COMPARISON:  08/07/2020 FINDINGS: Brain: Small focus of abnormal diffusion restriction within the right middle cerebellar peduncle. No other diffusion abnormality. There are foci of chronic microhemorrhage within the brainstem, left frontal lobe and right temporal lobe. Hyperintense  T2-weighted signal is moderately widespread throughout the white matter. Generalized volume loss without a clear lobar predilection. The midline structures are normal. Vascular: Major flow voids are preserved. Skull and upper cervical spine: Normal calvarium and skull base. Visualized upper cervical spine and soft tissues are normal. Sinuses/Orbits:No paranasal sinus fluid levels or advanced mucosal thickening. No mastoid or middle ear effusion. Normal orbits. IMPRESSION: 1. Small focus of acute ischemia within the right middle cerebellar peduncle. No hemorrhage or mass effect. 2. Moderate chronic small vessel ischemic disease and Cerebral Atrophy (ICD10-G31.9). Electronically Signed   By: Ulyses Jarred M.D.   On: 12/05/2020 23:26   CT ABDOMEN PELVIS W CONTRAST  Result Date: 12/05/2020 CLINICAL DATA:  Abdominal distension EXAM: CT ABDOMEN AND PELVIS WITH CONTRAST TECHNIQUE: Multidetector CT imaging of the abdomen and pelvis was performed using the standard protocol following bolus administration of intravenous contrast. CONTRAST:  40m OMNIPAQUE IOHEXOL 350 MG/ML SOLN COMPARISON:  None. FINDINGS: Lower chest: No acute abnormality.  Coronary artery calcifications. Hepatobiliary: No focal liver abnormality is seen. Status post cholecystectomy. No biliary dilatation. Pancreas: No focal abnormality or ductal dilatation. Spleen: No focal abnormality.  Normal size. Adrenals/Urinary Tract: Scarring and cortical thinning in the kidneys bilaterally. No hydronephrosis. No renal or adrenal mass. Urinary bladder unremarkable. 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 Hyperdense area noted in the proximal duodenum in the area of prior hemorrhage measuring 1.3 cm. Cannot exclude duodenal lesion. No bowel obstruction. Vascular/Lymphatic: No evidence of aneurysm or adenopathy. Reproductive: Uterus and adnexa unremarkable.  No mass. Other: No free fluid or free air. Musculoskeletal: No acute bony  abnormality. IMPRESSION: 1.3 cm hyperdense focus seen within the proximal duodenum in the area of prior hemorrhage. Cannot exclude duodenal wall lesion/mass. Consider GI consultation. No acute findings in the abdomen or pelvis. Electronically Signed   By: KRolm BaptiseM.D.   On: 12/05/2020 21:37   ECHOCARDIOGRAM COMPLETE BUBBLE STUDY  Result Date: 12/08/2020    ECHOCARDIOGRAM REPORT   Patient Name:   DEdna BayDate of Exam: 12/08/2020 Medical Rec #:  [id number]            Height:       62.0 in Accession #:    [id number]           Weight:       159.8 lb Date of Birth:  [birth year]            BSA:  1.738 m Patient Age:    89 years              BP:           186/72 mmHg Patient Gender: F                     HR:           64 bpm. Exam Location:  [facility] Procedure: 2D Echo, Saline Contrast Bubble Study and Strain Analysis Indications:     Stroke I63.9  History:         Patient has prior history of Echocardiogram examinations, most                  recent 08/08/2020.  Sonographer:     Kathlen Brunswick RDCS Referring Phys:  [id number] ERIC LINDZEN Diagnosing Phys: Kate Sable MD  Sonographer Comments: Global longitudinal strain was attempted. IMPRESSIONS  1. Left ventricular ejection fraction, by estimation, is 55 to 60%. The left ventricle has normal function. The left ventricle has no regional wall motion abnormalities. Left ventricular diastolic parameters were normal. The average left ventricular global longitudinal strain is -19.6 %. The global longitudinal strain is normal.  2. Right ventricular systolic function is normal. The right ventricular size is normal.  3. The mitral valve is normal in structure. Trivial mitral valve regurgitation.  4. The aortic valve was not well visualized. Aortic valve regurgitation is not visualized. FINDINGS  Left Ventricle: Left ventricular ejection fraction, by estimation, is 55 to 60%. The left ventricle has normal function. The left ventricle has no regional wall  motion abnormalities. The average left ventricular global longitudinal strain is -19.6 %. The global longitudinal strain is normal. The left ventricular internal cavity size was normal in size. There is no left ventricular hypertrophy. Left ventricular diastolic parameters were normal. Right Ventricle: The right ventricular size is normal. No increase in right ventricular wall thickness. Right ventricular systolic function is normal. Left Atrium: Left atrial size was normal in size. Right Atrium: Right atrial size was normal in size. Pericardium: There is no evidence of pericardial effusion. Mitral Valve: The mitral valve is normal in structure. Trivial mitral valve regurgitation. Tricuspid Valve: The tricuspid valve is normal in structure. Tricuspid valve regurgitation is not demonstrated. Aortic Valve: The aortic valve was not well visualized. Aortic valve regurgitation is not visualized. Aortic valve peak gradient measures 9.4 mmHg. Pulmonic Valve: The pulmonic valve was not well visualized. Pulmonic valve regurgitation is not visualized. Aorta: The aortic root and ascending aorta are structurally normal, with no evidence of dilitation. Venous: The inferior vena cava was not well visualized. IAS/Shunts: No atrial level shunt detected by color flow Doppler. Agitated saline contrast was given intravenously to evaluate for intracardiac shunting.  LEFT VENTRICLE PLAX 2D LVIDd:         4.95 cm   Diastology LVIDs:         3.53 cm   LV e' medial:    6.85 cm/s LV PW:         1.04 cm   LV E/e' medial:  12.1 LV IVS:        0.94 cm   LV e' lateral:   10.30 cm/s LVOT diam:     2.00 cm   LV E/e' lateral: 8.1 LV SV:         48 LV SV Index:   28        2D Longitudinal Strain LVOT Area:  3.14 cm  2D Strain GLS Avg:     -19.6 %  RIGHT VENTRICLE RV Basal diam:  2.08 cm RV S prime:     12.30 cm/s TAPSE (M-mode): 2.2 cm LEFT ATRIUM             Index        RIGHT ATRIUM          Index LA diam:        3.70 cm 2.13 cm/m   RA Area:      9.54 cm LA Vol (A2C):   41.4 ml 23.82 ml/m  RA Volume:   19.10 ml 10.99 ml/m LA Vol (A4C):   42.8 ml 24.63 ml/m LA Biplane Vol: 45.3 ml 26.07 ml/m  AORTIC VALVE                 PULMONIC VALVE AV Area (Vmax): 1.54 cm     PV Vmax:       1.06 m/s AV Vmax:        153.00 cm/s  PV Peak grad:  4.5 mmHg AV Peak Grad:   9.4 mmHg LVOT Vmax:      75.10 cm/s LVOT Vmean:     45.900 cm/s LVOT VTI:       0.153 m  AORTA Ao Root diam: 2.80 cm Ao Asc diam:  2.80 cm MITRAL VALVE MV Area (PHT): 2.81 cm    SHUNTS MV Decel Time: 270 msec    Systemic VTI:  0.15 m MV E velocity: 83.10 cm/s  Systemic Diam: 2.00 cm MV A velocity: 74.10 cm/s MV E/A ratio:  1.12 Kate Sable MD Electronically signed by Kate Sable MD Signature Date/Time: 12/08/2020/1:50:47 PM    Final    DG HIP UNILAT WITH PELVIS 2-3 VIEWS LEFT  Result Date: 12/08/2020 CLINICAL DATA:  Pain.  Recent fall. EXAM: DG HIP (WITH OR WITHOUT PELVIS) 2-3V LEFT COMPARISON:  Abdominopelvic CT 12/05/2020 FINDINGS: Intramedullary nail with trans trochanteric screw fixation of the proximal left femur. The hardware is intact were visualized. There is no periprosthetic lucency or fracture. Intact pubic rami. Pubic symphysis and sacroiliac joints are congruent. The bones are under mineralized. There is mild bilateral hip osteoarthritis with acetabular spurring. Age advanced vascular calcifications. IMPRESSION: 1. No acute fracture of the pelvis or hip. 2. Postsurgical change of the left femur without hardware complication. Electronically Signed   By: Keith Rake M.D.   On: 12/08/2020 17:10    Assessment and Plan:   [name] is a 50 y.o. y/o female who comes in today with an abnormal CT scan showing a lesion in the duodenum. Since the patient has had a gastric bypass Roux-en-Y surgery scope would need to be passed beyond her esophagus, her remnant stomach, At least 100 cm of jejunum to find the opening to the distal duodenum and transverse it in a  retrograde fashion towards the detach stomach.  I have explained to the patient that this procedure cannot be done at our hospital since we do not have double balloon enteroscopy or spiral endoscopy to reach that far down her intestines.  When speaking to radiology today they suggested that we could repeat the CT scan to follow is progression.  I proposed both options to the patient of repeat his CT scan in 3 month's time or sending her for another double balloon enteroscopy since the spiral endoscopy could not reach that area at [facility].  She states that she would like to wait and repeat the CT  scan in February and if the lesion is still there then we can send her to [facility] since she does not want to follow up at [facility] again.  The patient has been explained the plan and agrees with it.     [name], MD. Marval Regal    Note: This dictation was prepared with Dragon dictation along with smaller phrase technology. Any transcriptional errors that result from this process are unintentional.

## 2021-01-07 ENCOUNTER — Other Ambulatory Visit: Payer: Self-pay

## 2021-01-07 MED ORDER — DIPHENOXYLATE-ATROPINE 2.5-0.025 MG PO TABS: 1.0000 | ORAL_TABLET | Freq: Four times a day (QID) | ORAL | 5 refills | Status: DC | PRN

## 2021-01-07 MED ORDER — DIPHENOXYLATE-ATROPINE 2.5-0.025 MG PO TABS: 1.0000 | ORAL_TABLET | Freq: Four times a day (QID) | ORAL | 5 refills | Status: AC | PRN

## 2021-01-08 ENCOUNTER — Telehealth: Payer: Self-pay | Admitting: Family Medicine

## 2021-01-08 NOTE — Telephone Encounter (Signed)
Home Health Verbal Orders - Caller/Agency: [facility]// [place] Number: [phone] [id number]  [id number] OT/PT/Skilled Nursing/Social Work/Speech Therapy: PT  Frequency: 1w2    Additional visits starting on 01/27/21

## 2021-01-09 IMAGING — CR DG CHEST 2V
1 series · 2 of 2 positions shown · non-contrast
Comparison: Chest radiograph dated 01/19/2019.

CLINICAL DATA: 48-year-old female with chest pain.

EXAM:
CHEST - 2 VIEW

[Series 1: dg chest 2 view · 0.14mm/px · 2 of 2 slices shown]
[im 1/2]
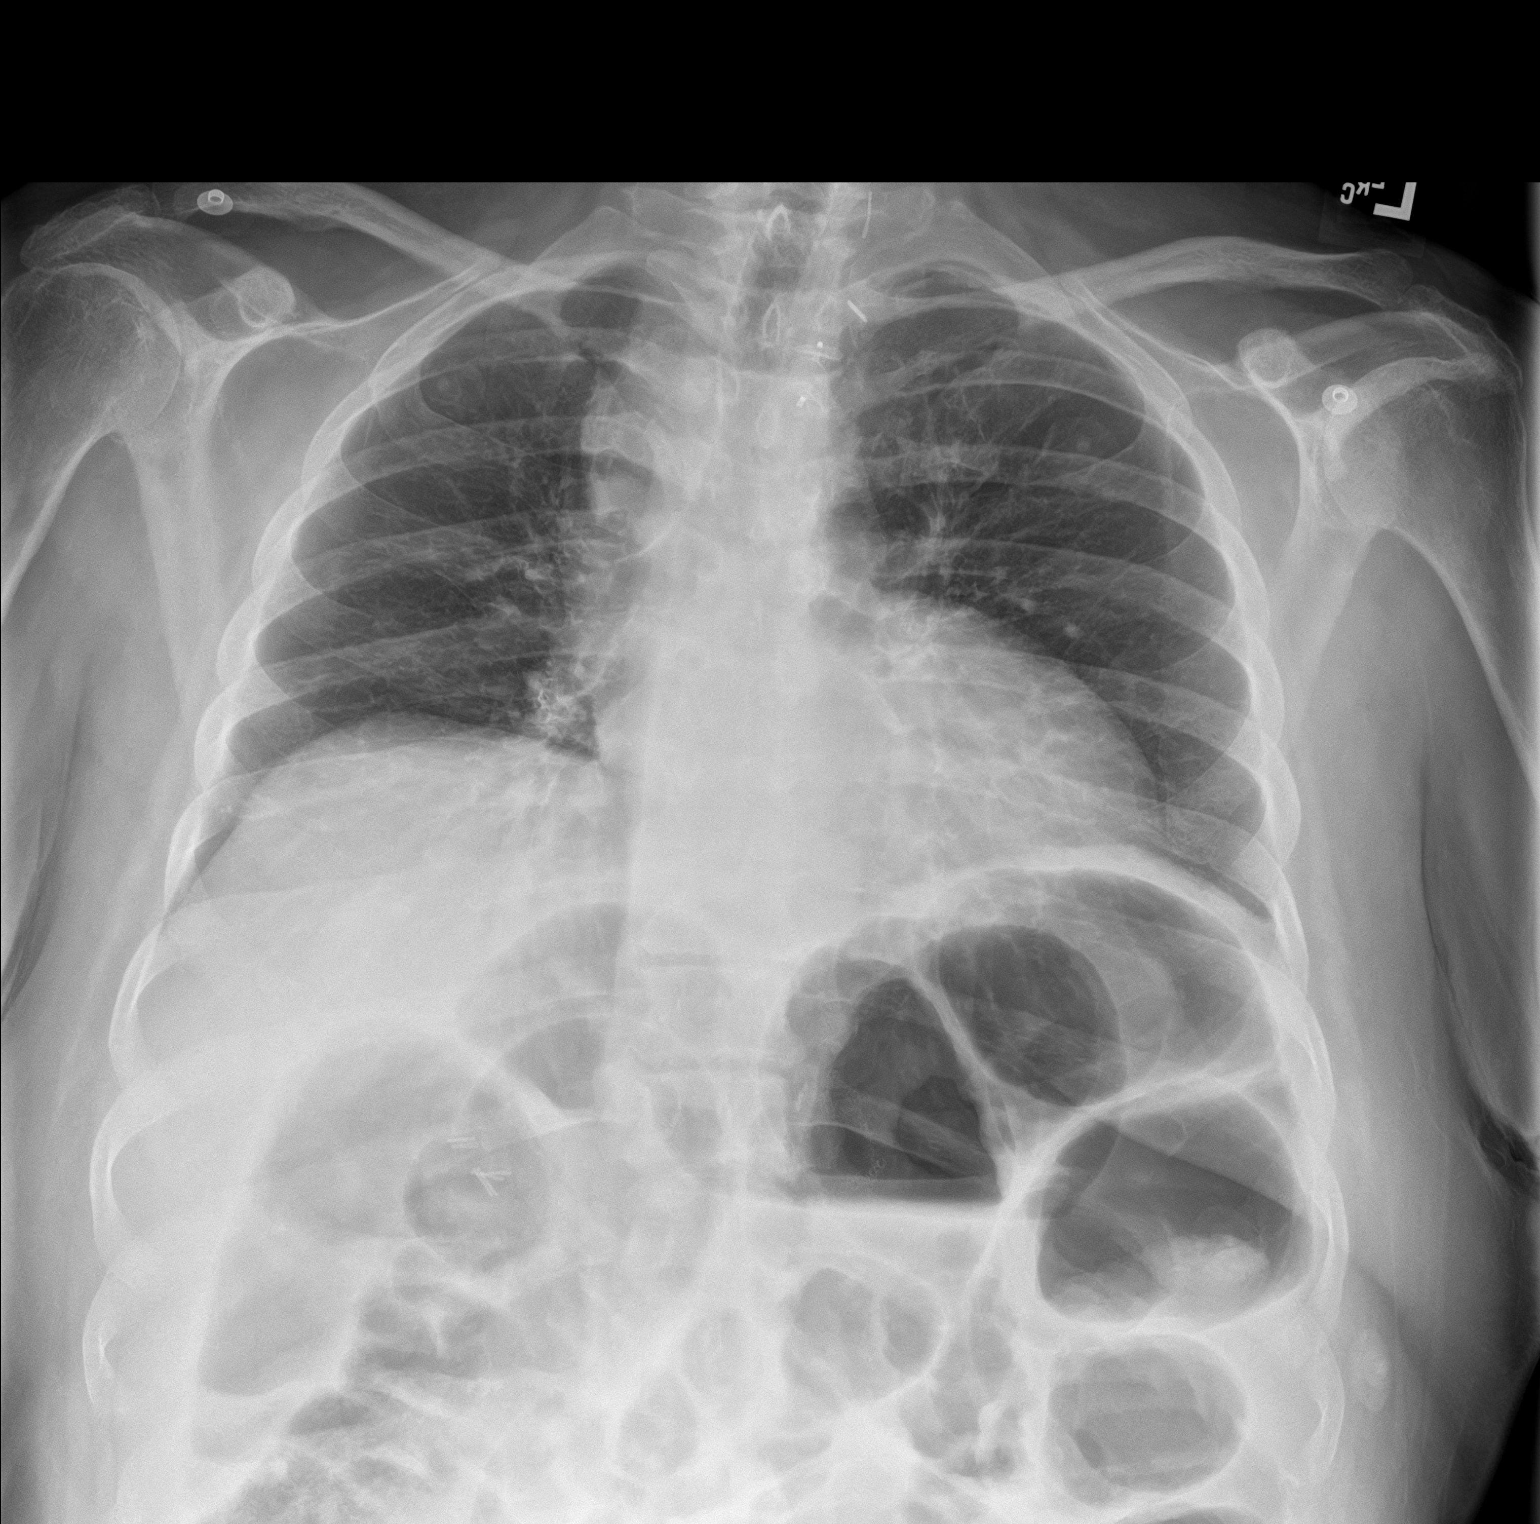
[im 2/2]
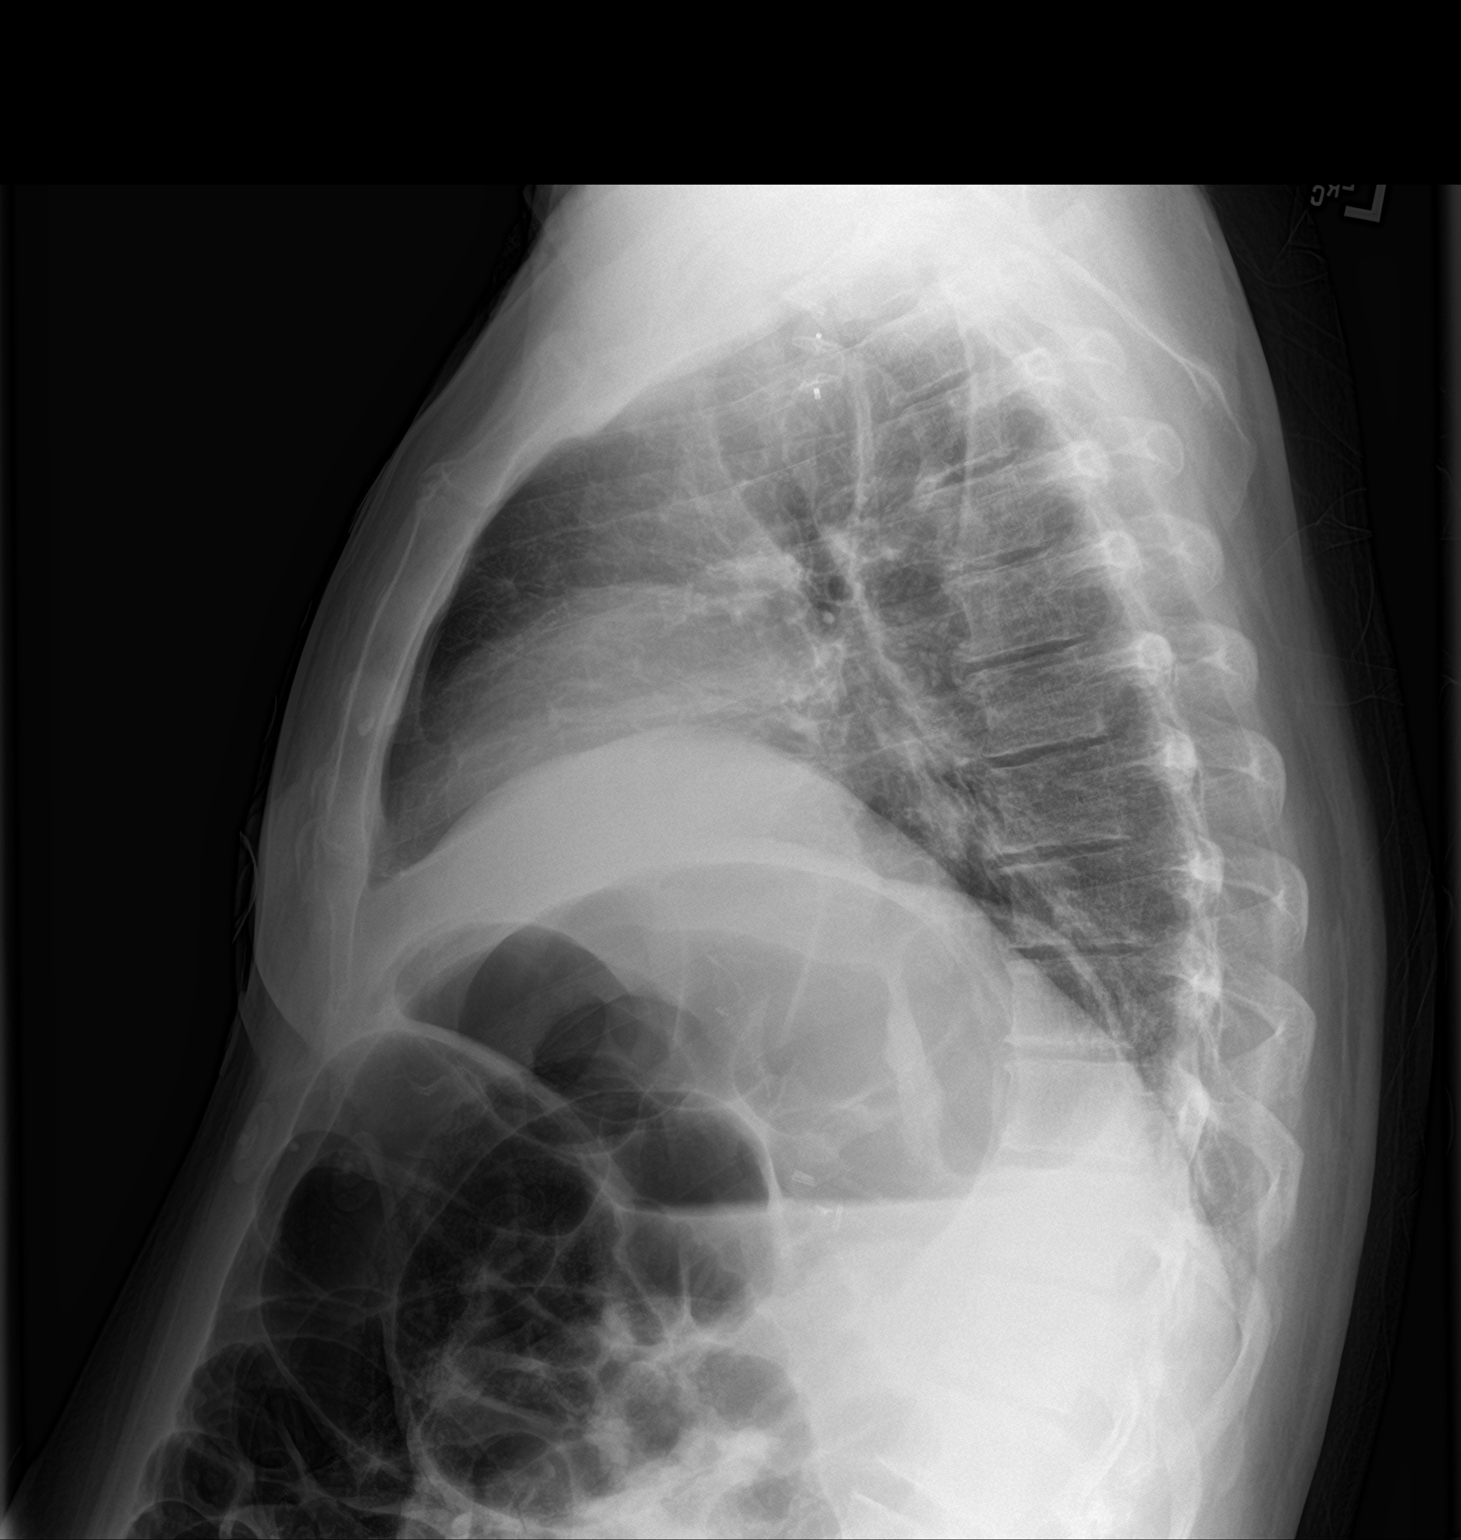

[2 of 2 positions shown; findings below may reference images not displayed]

FINDINGS: Shallow inspiration. Bilateral perihilar streaky densities, likely
atelectasis. Developing infiltrate is less likely. Clinical
correlation is recommended. No focal consolidation, pleural
effusion, or pneumothorax. Stable cardiac silhouette. Surgical clips
noted in the upper mediastinum. No acute osseous pathology. There is
air distention the colon.
IMPRESSION: 1. No focal consolidation.
2. Bilateral perihilar streaky densities, likely atelectasis.
Developing infiltrate is less likely.

## 2021-01-09 NOTE — Telephone Encounter (Signed)
That's fine

## 2021-01-09 NOTE — Telephone Encounter (Signed)
I returned [name]'s call and advised her of Dr. Maralyn Sago message below.

## 2021-01-17 ENCOUNTER — Telehealth: Payer: Self-pay | Admitting: *Deleted

## 2021-01-17 ENCOUNTER — Encounter: Payer: Self-pay | Admitting: Neurology

## 2021-01-17 ENCOUNTER — Encounter: Payer: Self-pay | Admitting: *Deleted

## 2021-01-17 ENCOUNTER — Encounter: Payer: Self-pay | Admitting: Family Medicine

## 2021-01-17 ENCOUNTER — Encounter: Payer: Self-pay | Admitting: Oncology

## 2021-01-17 ENCOUNTER — Other Ambulatory Visit: Payer: Self-pay

## 2021-01-17 ENCOUNTER — Ambulatory Visit (INDEPENDENT_AMBULATORY_CARE_PROVIDER_SITE_OTHER): Payer: Medicare Other | Admitting: Neurology

## 2021-01-17 VITALS — BP 135/86 | HR 76 | Ht 63.0 in | Wt 158.0 lb

## 2021-01-17 DIAGNOSIS — G43709 Chronic migraine without aura, not intractable, without status migrainosus: Secondary | ICD-10-CM | POA: Insufficient documentation

## 2021-01-17 DIAGNOSIS — I639 Cerebral infarction, unspecified: Secondary | ICD-10-CM | POA: Diagnosis not present

## 2021-01-17 DIAGNOSIS — R413 Other amnesia: Secondary | ICD-10-CM | POA: Insufficient documentation

## 2021-01-17 LAB — TIQ PAIC

## 2021-01-17 MED ORDER — NURTEC 75 MG PO TBDP: 75.0000 mg | ORAL_TABLET | ORAL | 11 refills | Status: AC | PRN

## 2021-01-17 MED ORDER — NURTEC 75 MG PO TBDP: 75.0000 mg | ORAL_TABLET | ORAL | 11 refills | Status: DC | PRN

## 2021-01-17 NOTE — Telephone Encounter (Signed)
[id number] approved through 02/01/2022.

## 2021-01-17 NOTE — Telephone Encounter (Signed)
PA for Nurtec 75mg  started on covermymeds (key: Nitro). Pt has coverage through [employer] ([id number]).Decision pending.

## 2021-01-17 NOTE — Progress Notes (Signed)
Chief Complaint  Patient presents with   New Patient (Initial Visit)    Rm 15. Accompanied by niece. PCP Dr. Lelon [name]. NX [name] 2014/Internal referral for CVA. Pt is concerned about a blood clotting disorder. Pt has memory concerns.      ASSESSMENT AND PLAN  [name] is a 50 y.o. female   Slow progressive worsening MRI brain findings, Chronic migraine  memory loss  Periventricular, juxtacortical T2/flair lesions,  Long history of migraine, also complains of memory loss, MoCA examination 21/30, strong family history of migraine,  Differentiation diagnosis including CADASIL, cannot rule out the possibility of demyelinating disease, stroke/small vessel disease,  Notch 3 test,  Fluoroscopy guided lumbar puncture  Repeat cardiolipin antibody  Return to clinic in 2 to 3 months  Nurtec as needed for worsening migraine   DIAGNOSTIC DATA (LABS, IMA memory loss GING, TESTING) - I reviewed patient records, labs, notes, testing and imaging myself where available. Lab evaluations in November 2022: CMP, elevated creatinine 2.45, hemoglobin 11.9  Urine culture was positive for Enterococcus faeceium  Cardiolipin antibody, negative IgG, IgA, positive IgM Prothrombin gene mutation -5 Leyden, mild elevated homocystine level 19.1, beta-2 glycol IgG, IgM IgA was within normal limit, negative lupus anticoagulation panel, protein S, protein C, Antithrombin III activity were within normal limit, normal TSH, CPK, A1c, ferritin was about 11  MEDICAL HISTORY:  [name] is a 50 year old female,, seen in request by Dr. Florene [name], Kendra Opitz., for evaluation of abnormal MRI scan, she is accompanied by her niece at today's visit on January 17, 2021, her primary care physician is Dr.Fisher, Kendra Peri, MD   I reviewed and summarized the referring note. PMHX. Chronic migraine Depression, anxiety HTN Hypothyrodism HLD CAD Bariatric surgery in 2011, lost weight 86 Lb,   History of GI bleeding secondary to gastric ulcer, ulceration at GI abdomen stenosis, was treated in February 2022, Iron deficiency -continue iron infusion at [facility], under the care of hematologist  I reviewed extensive evaluation in the past, referring, and recent hospital discharge summary. I saw her in 2014 for persistent headache, I personally reviewed MRI of the brain in April 2014, only mild T2/FLAIR hyperintensity lesions, location are most suggestive of small vessel disease.  She lost follow-up since May 2014, followed by her neurologist Dr. Manuella Ghazi.  Hospital admission on November 3 to 8 for acute mental status with increased slurred speech, generalized weakness, was treated for UTI, hypertensive emergency, in addition, I personally reviewed MRI of the brain without contrast on December 05, 2020, positive DWI lesion at right middle cerebellar peduncle, but extensive periventricular T2/FLAIR hyperdensity changes, mild generalized atrophy  I was able to compare  her MRI scan to multiple previous images, there was progressive worsening white matter changes, even most recent 03 December 2020 compared to 2020, there is increased T2/FLAIR white matter changes, more noticeable at left parietal juxtacortical, right parietal periventricular regions.  No contrast-enhancement, T2 black holes,  MRA of brain showed no large vessel disease Echocardiogram showed no significant abnormalities,  Ultrasound of carotid artery showed no significant carotid artery disease,  Hypercoagulable study prior to hospital discharge in November 2022 only showed mild elevated cardiolipin IgM antibody, indeterminant, rest of the laboratory evaluation showed no significant abnormalities  Laboratory showed elevated creatinine 1.57, otherwise normal CMP, and elevated WBC 12, mild anemia at 10.8,  LDL 62, triglyceride 301   She complaints of memory loss, today's MoCA examination is 21/30, also complains of moderate  to severe daily headaches, despite taking aimovig 140 mg daily  PHYSICAL EXAM:   Vitals:   01/17/21 0926  BP: 135/86  Pulse: 76  Weight: 158 lb (71.7 kg)  Height: '5\' 3"'  (1.6 m)   Not recorded     Body mass index is 27.99 kg/m.  PHYSICAL EXAMNIATION:  Gen: NAD, conversant, well nourised, well groomed                     Cardiovascular: Regular rate rhythm, no peripheral edema, warm, nontender. Eyes: Conjunctivae clear without exudates or hemorrhage Neck: Supple, no carotid bruits. Pulmonary: Clear to auscultation bilaterally   NEUROLOGICAL EXAM:  MENTAL STATUS: Speech:    Speech is normal; fluent and spontaneous with normal comprehension.  Cognition:   Montreal Cognitive Assessment  01/17/2021  Visuospatial/ Executive (0/5) 2  Naming (0/3) 3  Attention: Read list of digits (0/2) 2  Attention: Read list of letters (0/1) 1  Attention: Serial 7 subtraction starting at 100 (0/3) 0  Language: Repeat phrase (0/2) 1  Language : Fluency (0/1) 1  Abstraction (0/2) 2  Delayed Recall (0/5) 2  Orientation (0/6) 6  Total 20  Adjusted Score (based on education) 21      CRANIAL NERVES: CN II: Visual fields are full to confrontation. Pupils are round equal and briskly reactive to light. CN III, IV, VI: extraocular movement are normal. No ptosis. CN V: Facial sensation is intact to light touch CN VII: Face is symmetric with normal eye closure  CN VIII: Hearing is normal to causal conversation. CN IX, X: Phonation is normal. CN XI: Head turning and shoulder shrug are intact  MOTOR: There is no pronator drift of out-stretched arms. Muscle bulk and tone are normal. Muscle strength is normal.  REFLEXES: Reflexes are 2+ and symmetric at the biceps, triceps, knees, and ankles. Plantar responses are flexor.  SENSORY: Length dependent sensory changes,  COORDINATION: There is no trunk or limb dysmetria noted.  GAIT/STANCE: She needs push-up to get up from seated position,  cautious, mildly unsteady  REVIEW OF SYSTEMS:  Full 14 system review of systems performed and notable only for as above All other review of systems were negative.   ALLERGIES: Allergies  Allergen Reactions   Lipitor [Atorvastatin] Other (See Comments)    Leg pains    HOME MEDICATIONS: Current Outpatient Medications  Medication Sig Dispense Refill   AIMOVIG 140 MG/ML SOAJ Inject into the skin.     ALPRAZolam (XANAX) 1 MG tablet Take 1 tablet by mouth twice daily 60 tablet 5   aspirin EC 81 MG EC tablet Take 1 tablet (81 mg total) by mouth daily. Swallow whole. 30 tablet 1   Baclofen 5 MG TABS Take 1 tablet by mouth 2 (two) times daily as needed.     BAQSIMI ONE PACK 3 MG/DOSE POWD Place 1 spray into both nostrils as directed.     buPROPion (WELLBUTRIN SR) 100 MG 12 hr tablet Take 100 mg by mouth 2 (two) times daily.     carvedilol (COREG) 12.5 MG tablet Take 1 tablet by mouth twice daily 60 tablet 11   clopidogrel (PLAVIX) 75 MG tablet Take 1 tablet (75 mg total) by mouth daily. 90 tablet 3   desvenlafaxine (PRISTIQ) 50 MG 24 hr tablet Take 50 mg by mouth daily.     diclofenac Sodium (VOLTAREN) 1 % GEL Apply 2 g topically 4 (four) times daily.     diphenoxylate-atropine (LOMOTIL) 2.5-0.025 MG tablet Take 1 tablet  by mouth 4 (four) times daily as needed for diarrhea or loose stools. 120 tablet 5   estradiol (ESTRACE) 0.1 MG/GM vaginal cream Estrogen Cream Instruction Discard applicator Apply pea sized amount to tip of finger to urethra before bed. Wash hands well after application. Use Monday, Wednesday and Friday 42.5 g 12   furosemide (LASIX) 20 MG tablet Take 1 tablet by mouth every day as needed for swelling 30 tablet 6   gabapentin (NEURONTIN) 300 MG capsule Take 2 capsules (600 mg total) by mouth 2 (two) times daily. 360 capsule 4   glucose 4 GM chewable tablet Chew 1 tablet (4 g total) by mouth 3 (three) times daily. 50 tablet 12   isosorbide mononitrate (IMDUR) 120 MG 24 hr  tablet Take 1 tablet by mouth every day 30 tablet 1   levothyroxine (SYNTHROID) 25 MCG tablet Take 1 tablet by mouth 30 minutes before breakfast 30 tablet 5   nitrofurantoin (MACRODANTIN) 100 MG capsule Take 1 capsule (100 mg total) by mouth 2 (two) times daily. 30 capsule 11   nitroGLYCERIN (NITROSTAT) 0.4 MG SL tablet Dissolve 1 tablet under tongue every 61mns, up to 3 doses, for chest pain. Max 3 doses in 379ms. 25 tablet 11   pantoprazole (PROTONIX) 40 MG tablet Take 1 tablet (40 mg total) by mouth 2 (two) times daily. 180 tablet 1   Polyethyl Glycol-Propyl Glycol (SYSTANE OP) Place 1 drop into both eyes daily as needed (dry eyes).     promethazine (PHENERGAN) 25 MG tablet Take 1 tablet (25 mg total) by mouth every 6 (six) hours as needed for nausea or vomiting. 30 tablet 11   rosuvastatin (CRESTOR) 40 MG tablet Take 1 tablet (40 mg total) by mouth daily at 6 PM. 90 tablet 3   traMADol (ULTRAM) 50 MG tablet Take 1 tablet (50 mg total) by mouth every 6 (six) hours as needed. 30 tablet 2   traZODone (DESYREL) 100 MG tablet Take 1 to 2 tablets by mouth at bedtime 30 tablet 5   No current facility-administered medications for this visit.    PAST MEDICAL HISTORY: Past Medical History:  Diagnosis Date   Acute colitis 01/27/2017   Acute pyelonephritis    Acute upper GI bleed 01/25/2020   Anemia    iron deficiency anemia   Aortic arch aneurysm    BRCA negative 2014   CAD (coronary artery disease)    a. 08/2003 Cath: LAD 30-40-med Rx; b. 11/2014 PCI: LAD 9540m.25x23 Xience Alpine DES); c. 06/2015 PCI: D1 (2.25x12 Resolute Integrity DES); d. 06/2017 PCI: Patent mLAD stent, D2 95 (PTCA); e. 09/2017 PCI: D2 99ost (CBA); d. 12/2017 Cath: LM nl, LAD 54m12md (small), D1 40ost, D2 95ost, LCX 40p, RCA 40ost/p->Med rx for D2 given restenosis.   Closed nondisplaced intertrochanteric fracture of left femur (HCC)[place]9/2020   Colitis 06/03/2015   Colon polyp    CVA (cerebral vascular accident) (HCC)[place] Left  side weakness.    Degenerative tear of glenoid labrum of right shoulder 03/15/2017   Diabetes mellitus without complication (HCC)[place] Family history of breast cancer    BRCA neg 2014   Femur fracture, left (HCC)[place]9/2020   Gastric ulcer 3/253/61/4431elicobacter pylori infection 11/23/2014   History of echocardiogram    a. 03/2017 Echo: EF 60-65%, no rwma; b. 02/2018 Echo: EF 60-65%, no rwma. Nl RV fxn. No cardiac source of emboli (admitted w/ stroke).   Hypertension    Malignant melanoma of  skin of scalp (HCC)    MI, acute, non ST segment elevation (HCC)    Neuromuscular disorder ([place])    S/P drug eluting coronary stent placement 06/04/2015   Sepsis ([place]) 03/18/2017   Sepsis secondary to UTI ([place]) 04/23/2020   Stroke ([place])    a. 02/2018 MRI: 38m late acute/early subacute L medial frontal lobe inarct; b. 02/2018 MRA No large vessel occlusion or aneurysm. Mod to sev L P2 stenosis. thready L vertebral artery, diffusely dzs'd; c. 02/2018 Carotid U/S: <50% bilat ICA dzs.    PAST SURGICAL HISTORY: Past Surgical History:  Procedure Laterality Date   APPENDECTOMY     BALLOON ENTEROSCOPY  02/06/2020   [facility]   BIOPSY N/A 03/14/2020   Procedure: BIOPSY;  Surgeon: WLucilla Lame MD;  [facility]  Service: Endoscopy;  Laterality: N/A;   CARDIAC CATHETERIZATION N/A 11/09/2014   Procedure: Coronary Angiography;  Surgeon: TMinna Merritts MD;  [place] [facility];  Service: Cardiovascular;  Laterality: N/A;   CARDIAC CATHETERIZATION N/A 11/12/2014   Procedure: Coronary Stent Intervention;  Surgeon: AIsaias Cowman MD;  [facility];  Service: Cardiovascular;  Laterality: N/A;   CARDIAC CATHETERIZATION N/A 04/18/2015   Procedure: Left Heart Cath and Coronary Angiography;  Surgeon: TMinna Merritts MD;  [place] [facility];  Service: Cardiovascular;  Laterality: N/A;   CARDIAC CATHETERIZATION Left 06/04/2015   Procedure: Left Heart Cath and Coronary  Angiography;  Surgeon: MWellington Hampshire MD;  [place];  Service: Cardiovascular;  Laterality: Left;   CARDIAC CATHETERIZATION N/A 06/04/2015   Procedure: Coronary Stent Intervention;  Surgeon: MWellington Hampshire MD;  [facility];  Service: Cardiovascular;  Laterality: N/A;   CESAREAN SECTION  2001   CHOLECYSTECTOMY N/A 11/18/2016   Procedure: LAPAROSCOPIC CHOLECYSTECTOMY WITH INTRAOPERATIVE CHOLANGIOGRAM;  Surgeon: SChristene Lye MD;  Location: [facility];  Service: General;  Laterality: N/A;   COLONOSCOPY WITH PROPOFOL N/A 04/27/2016   Procedure: COLONOSCOPY WITH PROPOFOL;  Surgeon: DLucilla Lame MD;  [place]  Service: Endoscopy;  Laterality: N/A;   COLONOSCOPY WITH PROPOFOL N/A 01/12/2018   Procedure: COLONOSCOPY WITH PROPOFOL;  Surgeon: Tol[place], TBenay Pike MD;  Location: [facility];  Service: Endoscopy;  Laterality: N/A;   COLONOSCOPY WITH PROPOFOL N/A 03/14/2020   Procedure: COLONOSCOPY WITH PROPOFOL;  Surgeon: WLucilla Lame MD;  [place] [facility]  Service: Endoscopy;  Laterality: N/A;  Needs to be scheduled after 7:30 due to driver issues   CORONARY ANGIOPLASTY     CORONARY BALLOON ANGIOPLASTY N/A 06/29/2017   Procedure: CORONARY BALLOON ANGIOPLASTY;  Surgeon: AWellington Hampshire MD;  [place] [facility];  Service: Cardiovascular;  Laterality: N/A;   CORONARY BALLOON ANGIOPLASTY N/A 09/20/2017   Procedure: CORONARY BALLOON ANGIOPLASTY;  Surgeon: AWellington Hampshire MD;  [facility];  Service: Cardiovascular;  Laterality: N/A;   CORONARY STENT INTERVENTION N/A 12/13/2019   Procedure: CORONARY STENT INTERVENTION;  Surgeon: AWellington Hampshire MD;  [facility] [place];  Service: Cardiovascular;  Laterality: N/A;   DILATION AND CURETTAGE OF UTERUS     ESOPHAGOGASTRODUODENOSCOPY (EGD) WITH PROPOFOL N/A 09/14/2014   Procedure: ESOPHAGOGASTRODUODENOSCOPY (EGD) WITH PROPOFOL;  Surgeon:  MJosefine Class MD;  [facility]  Service: Endoscopy;  Laterality: N/A;   ESOPHAGOGASTRODUODENOSCOPY (EGD) WITH PROPOFOL N/A 04/27/2016   Procedure: ESOPHAGOGASTRODUODENOSCOPY (EGD) WITH PROPOFOL;  Surgeon: DLucilla Lame MD;  [place] [facility]  Service: Endoscopy;  Laterality: N/A;  Diabetic - oral meds   ESOPHAGOGASTRODUODENOSCOPY (EGD) WITH PROPOFOL N/A 01/12/2018   Procedure: ESOPHAGOGASTRODUODENOSCOPY (EGD) WITH PROPOFOL;  Surgeon: Toledo, Benay Pike, MD;  Location: [facility];  Service: Endoscopy;  Laterality: N/A;   ESOPHAGOGASTRODUODENOSCOPY (EGD) WITH PROPOFOL N/A 04/11/2019   Procedure: ESOPHAGOGASTRODUODENOSCOPY (EGD) WITH PROPOFOL;  Surgeon: [name], MD;  Location: [facility];  Service: Endoscopy;  Laterality: N/A;   ESOPHAGOGASTRODUODENOSCOPY (EGD) WITH PROPOFOL N/A 03/08/2020   Procedure: ESOPHAGOGASTRODUODENOSCOPY (EGD) WITH PROPOFOL;  Surgeon: [name], MD;  [facility];  Service: Endoscopy;  Laterality: N/A;   GASTRIC BYPASS  09/2009   [facility]    INTRAMEDULLARY (IM) NAIL INTERTROCHANTERIC Left 10/13/2018   Procedure: INTRAMEDULLARY (IM) NAIL INTERTROCHANTRIC;  Surgeon: Leandrew Koyanagi, MD;  Location: [place];  Service: Orthopedics;  Laterality: Left;   Left Carotid to sublcavian artery bypass w/ subclavian artery ligation     a. Performed @ [facility].   LEFT HEART CATH AND CORONARY ANGIOGRAPHY Left 06/29/2017   Procedure: LEFT HEART CATH AND CORONARY ANGIOGRAPHY;  Surgeon: Wellington Hampshire, MD;  Location: [place] [facility];  Service: Cardiovascular;  Laterality: Left;   LEFT HEART CATH AND CORONARY ANGIOGRAPHY N/A 09/20/2017   Procedure: LEFT HEART CATH AND CORONARY ANGIOGRAPHY;  Surgeon: Wellington Hampshire, MD;  Location: [place] [facility];  Service: Cardiovascular;  Laterality: N/A;   LEFT HEART CATH AND CORONARY ANGIOGRAPHY N/A 12/20/2017   Procedure: LEFT HEART CATH AND CORONARY ANGIOGRAPHY;  Surgeon:  Wellington Hampshire, MD;  Location: [place] [facility];  Service: Cardiovascular;  Laterality: N/A;   LEFT HEART CATH AND CORONARY ANGIOGRAPHY N/A 04/20/2019   Procedure: LEFT HEART CATH AND CORONARY ANGIOGRAPHY possible PCI;  Surgeon: Minna Merritts, MD;  Location: [place] [facility];  Service: Cardiovascular;  Laterality: N/A;   LEFT HEART CATH AND CORONARY ANGIOGRAPHY N/A 12/13/2019   Procedure: LEFT HEART CATH AND CORONARY ANGIOGRAPHY;  Surgeon: Wellington Hampshire, MD;  Location: [place] [facility];  Service: Cardiovascular;  Laterality: N/A;   MELANOMA EXCISION  2016   Dr. Henreitta Cea ABLATION  2002   RIGHT OOPHORECTOMY     SHOULDER ARTHROSCOPY WITH OPEN ROTATOR CUFF REPAIR Right 01/07/2016   Procedure: SHOULDER ARTHROSCOPY WITH DEBRIDMENT, SUBACHROMIAL DECOMPRESSION;  Surgeon: Corky Mull, MD;  Location: [facility];  Service: Orthopedics;  Laterality: Right;   SHOULDER ARTHROSCOPY WITH OPEN ROTATOR CUFF REPAIR Right 03/16/2017   Procedure: SHOULDER ARTHROSCOPY WITH OPEN ROTATOR CUFF REPAIR POSSIBLE BICEPS TENODESIS;  Surgeon: Corky Mull, MD;  Location: [facility];  Service: Orthopedics;  Laterality: Right;   TRIGGER FINGER RELEASE Right     [place]    FAMILY HISTORY: Family History  Problem Relation Age of Onset   Hypertension Mother    Anxiety disorder Mother    Depression Mother    Bipolar disorder Mother    Heart disease Mother        No details   Hyperlipidemia Mother    Kidney disease Father    Heart disease Father 87   Hypertension Father    Diabetes Father    Stroke Father    Colon cancer Father        dx in his 93's   Anxiety disorder Father    Depression Father    Skin cancer Father    Kidney disease Sister    Thyroid nodules Sister    Hypertension Sister    Hypertension Sister    Diabetes Sister  Hyperlipidemia Sister    Depression Sister    Breast cancer Maternal Aunt 78   Breast cancer Maternal Aunt 50   Ovarian cancer Cousin    Colon  cancer Cousin    Kidney cancer Cousin    Breast cancer Other    Bladder Cancer Neg Hx     SOCIAL HISTORY: Social History   Socioeconomic History   Marital status: Divorced    Spouse name: Not on file   Number of children: 1   Years of education: Not on file   Highest education level: High school graduate  Occupational History   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 Disabled through 08/02/2013 per forms 02/03/2013  Tobacco Use   Smoking status: Former    Packs/day: 0.25    Years: 1.00    Pack years: 0.25    Types: Cigarettes    Quit date: 08/31/1994    Years since quitting: 26.4   Smokeless tobacco: Never   Tobacco comments:    quit 28 years ago  Vaping Use   Vaping Use: Never used  Substance and Sexual Activity   Alcohol use: No    Alcohol/week: 0.0 standard drinks   Drug use: No   Sexual activity: Not Currently    Birth control/protection: None, Surgical    Comment: Ablation  Other Topics Concern   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   Lives at home with son   Social Determinants of Health   Financial Resource Strain: Low Risk    Difficulty of Paying Living Expenses: Not hard at all  Food Insecurity: No Food Insecurity   Worried About Charity fundraiser in the Last Year: Never true   Arboriculturist in the Last Year: Never true  Transportation Needs: No Transportation Needs   Lack of Transportation (Medical): No   Lack of Transportation (Non-Medical): No  Physical Activity: Not on file  Stress: Stress Concern Present   Feeling of Stress : Rather much  Social Connections: Moderately Integrated   Frequency of Communication with Friends and Family: More than three times a week   Frequency of Social Gatherings with Friends and Family: Twice a week   Attends Religious  Services: More than 4 times per year   Active Member of [employer] or Organizations: Yes   Attends Music therapist: More than 4 times per year   Marital Status: Divorced  Human resources officer Violence: Not At Risk   Fear of Current or Ex-Partner: No   Emotionally Abused: No   Physically Abused: No   Sexually Abused: No    Total time spent reviewing the chart, obtaining history, examined patient, ordering tests, documentation, consultations and family, care coordination was 63 mintues     Marcial Pacas, M.D. Ph.D.  [facility] [address] [place], [place] [address] Ph: [phone] Fax: [phone]  CC:  Elodia Florence., MD [address] [place],  [place] [address]  [name], MD

## 2021-01-18 ENCOUNTER — Encounter: Payer: Self-pay | Admitting: Neurology

## 2021-01-19 LAB — B12 AND FOLATE PANEL
Folate: 10.9 ng/mL (ref >3.0)
Vitamin B-12: 535 pg/mL (ref 232–1245)

## 2021-01-19 LAB — CARDIOLIPIN ANTIBODIES, IGG, IGM, IGA
Anticardiolipin IgA: <9 APL U/mL (ref 0–11)
Anticardiolipin IgG: 11 GPL U/mL (ref 0–14)
Anticardiolipin IgM: 28 MPL U/mL — ABNORMAL HIGH (ref 0–12)

## 2021-01-19 LAB — HOMOCYSTEINE: Homocysteine: 26.7 umol/L — ABNORMAL HIGH (ref 0.0–14.5)

## 2021-01-20 ENCOUNTER — Telehealth: Payer: Self-pay | Admitting: Neurology

## 2021-01-21 ENCOUNTER — Encounter: Payer: Self-pay | Admitting: Family Medicine

## 2021-01-21 ENCOUNTER — Encounter: Payer: Self-pay | Admitting: Gastroenterology

## 2021-01-21 NOTE — Telephone Encounter (Signed)
Sounds like this patient has uri or bronchitis. She need to make an appointment or go to urgent care for evaluation.

## 2021-01-22 ENCOUNTER — Other Ambulatory Visit: Payer: Self-pay

## 2021-01-22 ENCOUNTER — Ambulatory Visit
Admission: RE | Admit: 2021-01-22 | Discharge: 2021-01-22 | Disposition: A | Discharge status: Home / Self Care | Payer: Medicare Other | Source: Ambulatory Visit | Attending: Neurology | Admitting: Neurology

## 2021-01-22 ENCOUNTER — Encounter: Payer: Self-pay | Admitting: Oncology

## 2021-01-22 VITALS — BP 158/95 | HR 77

## 2021-01-22 DIAGNOSIS — I639 Cerebral infarction, unspecified: Secondary | ICD-10-CM

## 2021-01-22 DIAGNOSIS — G629 Polyneuropathy, unspecified: Secondary | ICD-10-CM

## 2021-01-22 NOTE — Discharge Instructions (Signed)
Lumbar Puncture Discharge Instructions  Go home and rest quietly as needed. You may resume normal activities; however, do not exert yourself strongly or do any heavy lifting today and tomorrow.   DO NOT drive today.    You may resume your normal diet and medications unless otherwise indicated. Drink lots of extra fluids today and tomorrow.   The incidence of headache, nausea, or vomiting is about 5% (one in 20 patients).  If you develop a headache, lie flat for 24 hours and drink plenty of fluids until the headache goes away.  Caffeinated beverages may be helpful. If when you get up you still have a headache when standing, go back to bed and force fluids for another 24 hours.   If you develop severe nausea and vomiting or a headache that does not go away with the flat bedrest after 48 hours, please call [phone].   Call your physician for a follow-up appointment.  The results of your Lumbar Puncture will be sent directly to your physician and they will contact you.   If you have any questions or if complications develop after you arrive home, please call [phone].  Discharge instructions have been explained to the patient.  The patient, or the person responsible for the patient, fully understands these instructions.   Thank you for visiting our office today.   

## 2021-01-22 NOTE — Progress Notes (Signed)
1 vial of blood drawn from pts LAC by Camelia Phenes RN to be sent off with LP lab work. Pt tolerated well. Gauze and tape applied after.

## 2021-01-22 NOTE — Telephone Encounter (Signed)
Patient stated that she will go to the UC.

## 2021-01-23 ENCOUNTER — Other Ambulatory Visit: Payer: Medicare Other | Admitting: *Deleted

## 2021-01-23 ENCOUNTER — Telehealth: Payer: Medicare Other | Admitting: Physician Assistant

## 2021-01-23 DIAGNOSIS — J208 Acute bronchitis due to other specified organisms: Secondary | ICD-10-CM

## 2021-01-23 DIAGNOSIS — B9689 Other specified bacterial agents as the cause of diseases classified elsewhere: Secondary | ICD-10-CM

## 2021-01-23 MED ORDER — DOXYCYCLINE HYCLATE 100 MG PO TABS: 100.0000 mg | ORAL_TABLET | Freq: Two times a day (BID) | ORAL | 0 refills | Status: DC

## 2021-01-23 MED ORDER — BENZONATATE 100 MG PO CAPS: 100.0000 mg | ORAL_CAPSULE | Freq: Three times a day (TID) | ORAL | 0 refills | Status: DC | PRN

## 2021-01-23 NOTE — Progress Notes (Signed)
I have spent 5 minutes in review of e-visit questionnaire, review and updating patient chart, medical decision making and response to patient.   Luetta Piazza Cody Kella Splinter, PA-C    

## 2021-01-23 NOTE — Progress Notes (Signed)
We are sorry that you are not feeling well.  Here is how we plan to help!  Based on your presentation I believe you most likely have A cough due to bacteria.  When patients have a fever and a productive cough with a change in color or increased sputum production, we are concerned about bacterial bronchitis.  If left untreated it can progress to pneumonia.  If your symptoms do not improve with your treatment plan it is important that you contact your provider.   I have prescribed Doxycycline 100 mg twice a day for 7 days     In addition you may use A prescription cough medication called Tessalon Perles 100mg. You may take 1-2 capsules every 8 hours as needed for your cough.  From your responses in the eVisit questionnaire you describe inflammation in the upper respiratory tract which is causing a significant cough.  This is commonly called Bronchitis and has four common causes:   Allergies Viral Infections Acid Reflux Bacterial Infection Allergies, viruses and acid reflux are treated by controlling symptoms or eliminating the cause. An example might be a cough caused by taking certain blood pressure medications. You stop the cough by changing the medication. Another example might be a cough caused by acid reflux. Controlling the reflux helps control the cough.  USE OF BRONCHODILATOR ("RESCUE") INHALERS: There is a risk from using your bronchodilator too frequently.  The risk is that over-reliance on a medication which only relaxes the muscles surrounding the breathing tubes can reduce the effectiveness of medications prescribed to reduce swelling and congestion of the tubes themselves.  Although you feel brief relief from the bronchodilator inhaler, your asthma may actually be worsening with the tubes becoming more swollen and filled with mucus.  This can delay other crucial treatments, such as oral steroid medications. If you need to use a bronchodilator inhaler daily, several times per day, you should  discuss this with your provider.  There are probably better treatments that could be used to keep your asthma under control.     HOME CARE Only take medications as instructed by your medical team. Complete the entire course of an antibiotic. Drink plenty of fluids and get plenty of rest. Avoid close contacts especially the very young and the elderly Cover your mouth if you cough or cough into your sleeve. Always remember to wash your hands A steam or ultrasonic humidifier can help congestion.   GET HELP RIGHT AWAY IF: You develop worsening fever. You become short of breath You cough up blood. Your symptoms persist after you have completed your treatment plan MAKE SURE YOU  Understand these instructions. Will watch your condition. Will get help right away if you are not doing well or get worse.    Thank you for choosing an e-visit.  Your e-visit answers were reviewed by a board certified advanced clinical practitioner to complete your personal care plan. Depending upon the condition, your plan could have included both over the counter or prescription medications.  Please review your pharmacy choice. Make sure the pharmacy is open so you can pick up prescription now. If there is a problem, you may contact your provider through MyChart messaging and have the prescription routed to another pharmacy.  Your safety is important to us. If you have drug allergies check your prescription carefully.   For the next 24 hours you can use MyChart to ask questions about today's visit, request a non-urgent call back, or ask for a work or school excuse.   You will get an email in the next two days asking about your experience. I hope that your e-visit has been valuable and will speed your recovery.  

## 2021-01-25 ENCOUNTER — Other Ambulatory Visit: Payer: Self-pay | Admitting: Cardiovascular Disease

## 2021-01-28 ENCOUNTER — Ambulatory Visit (INDEPENDENT_AMBULATORY_CARE_PROVIDER_SITE_OTHER): Payer: Medicare Other | Admitting: Neurology

## 2021-01-28 DIAGNOSIS — R410 Disorientation, unspecified: Secondary | ICD-10-CM

## 2021-01-28 DIAGNOSIS — I639 Cerebral infarction, unspecified: Secondary | ICD-10-CM

## 2021-01-30 ENCOUNTER — Telehealth: Payer: Self-pay | Admitting: Family Medicine

## 2021-01-30 NOTE — Telephone Encounter (Signed)
That's fine

## 2021-01-30 NOTE — Telephone Encounter (Signed)
Olevia Bowens PT with [facility] is calling in to request verbal orders to add on additional visits for PT, affective this week.    Frequency: 2 week 2 Phone: [phone] (secure vm)

## 2021-01-30 NOTE — Telephone Encounter (Signed)
Shemena advised.

## 2021-01-31 ENCOUNTER — Encounter: Payer: Self-pay | Admitting: Neurology

## 2021-01-31 IMAGING — CT CT HEAD W/O CM
3 series · 16 of 46 positions shown, 19 images · non-contrast
Comparison: Head CT dated 10/12/2018.

CLINICAL DATA: 48-year-old female with focal neurologic deficit.
Concern for stroke.

EXAM:
CT HEAD WITHOUT CONTRAST
TECHNIQUE: Contiguous axial images were obtained from the base of the skull
through the vertex without intravenous contrast.

[Series 3: head wo · axial · 0.39mm/px · z∈[+1115,+1235]mm · 10 of 29 slices shown, 13 images]
[im 3/29  brain]
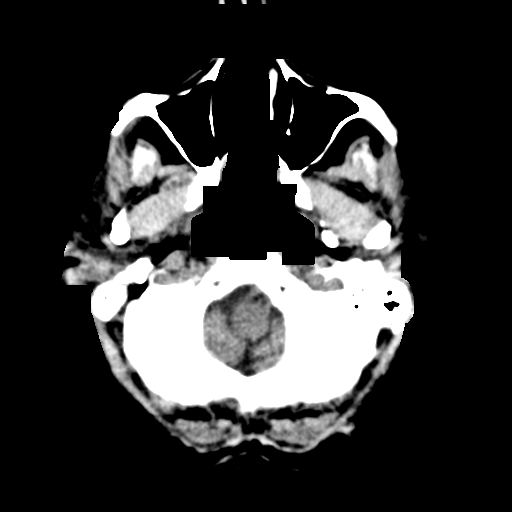
[im 3/29  bone]
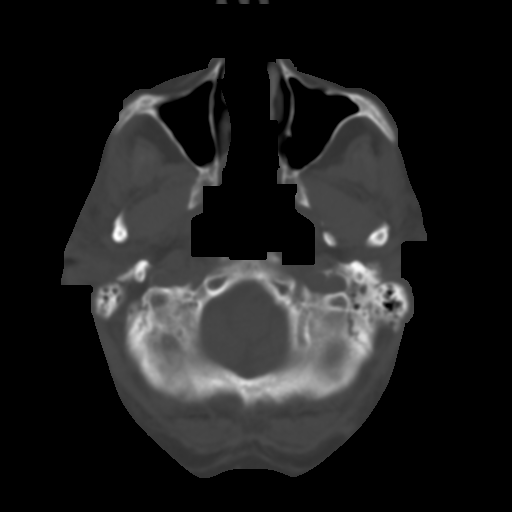
[im 6/29  brain]
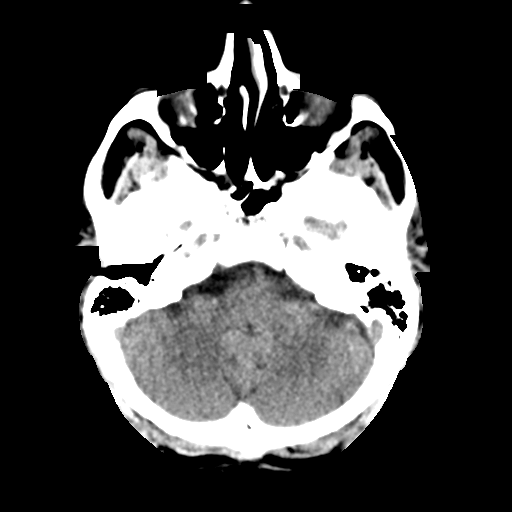
[im 8/29  brain]
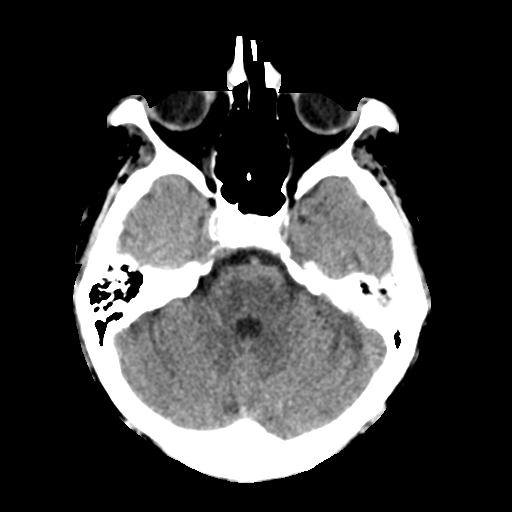
[im 11/29  brain]
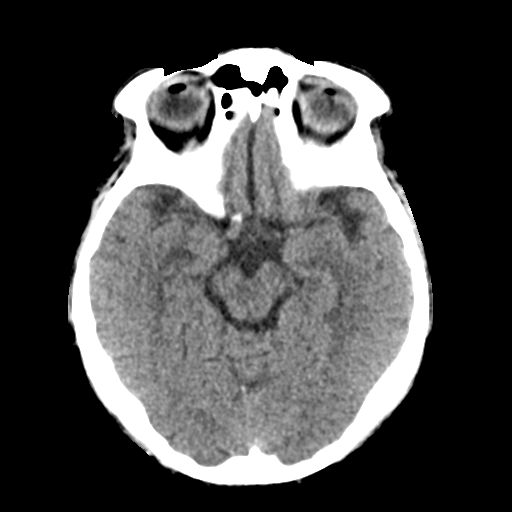
[im 14/29  brain]
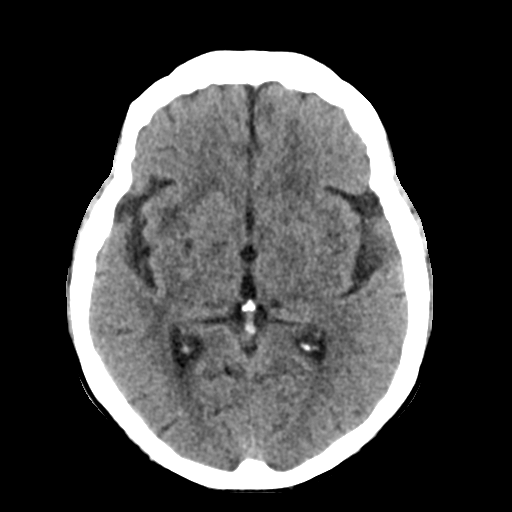
[im 14/29  bone]
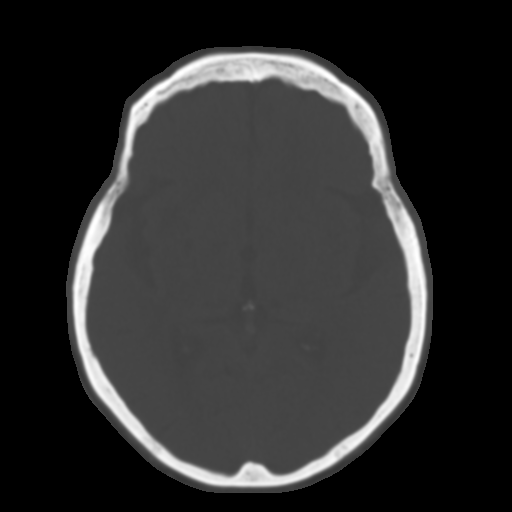
[im 16/29  brain]
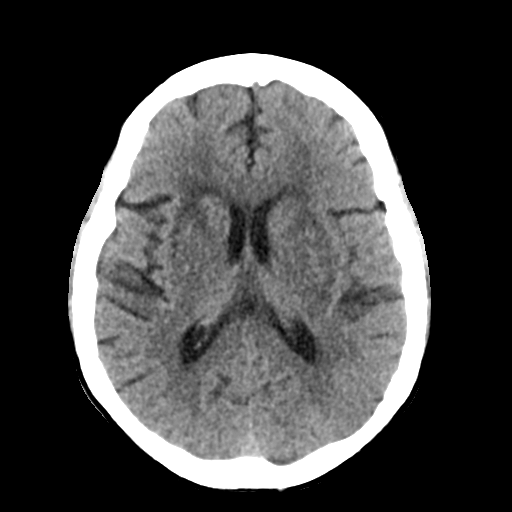
[im 19/29  brain]
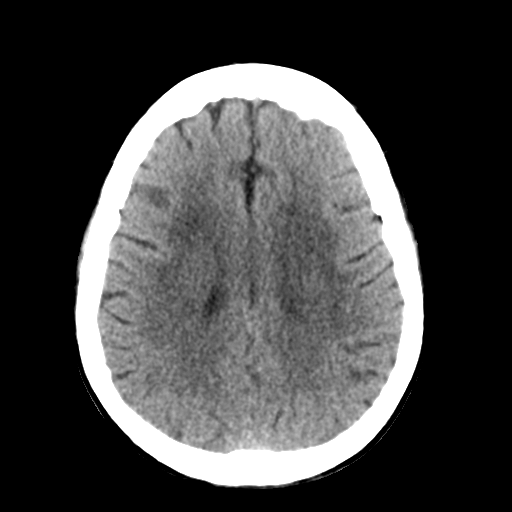
[im 22/29  brain]
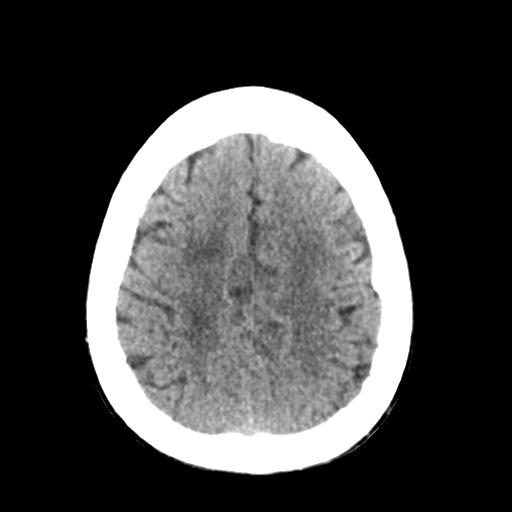
[im 24/29  brain]
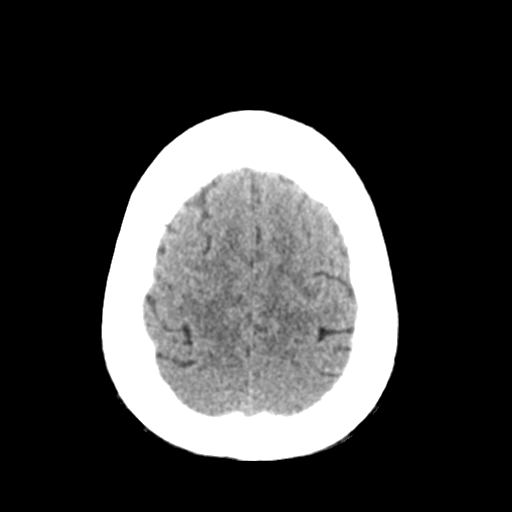
[im 24/29  bone]
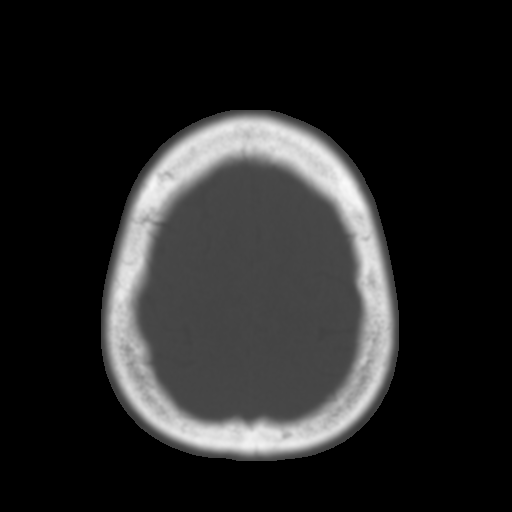
[im 27/29  brain]
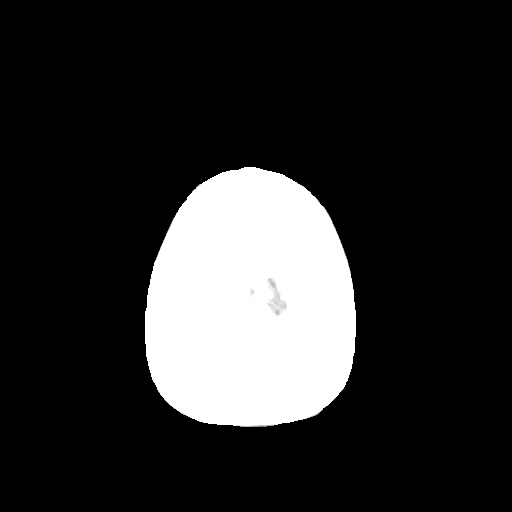

[Series 4: coronal soft tissue · coronal · 0.29mm/px · 3 of 63 slices shown]
[im 21/63  brain]
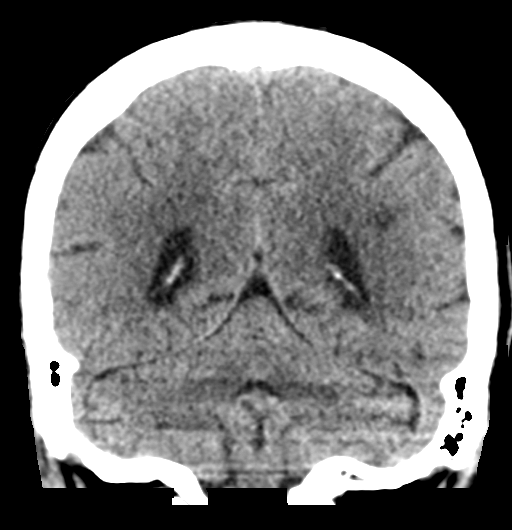
[im 28/63  brain]
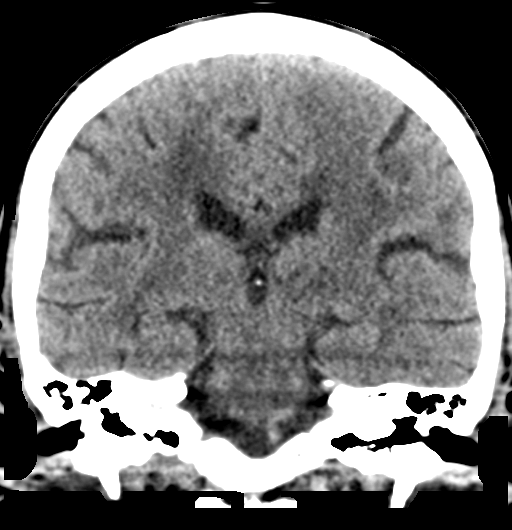
[im 35/63  brain]
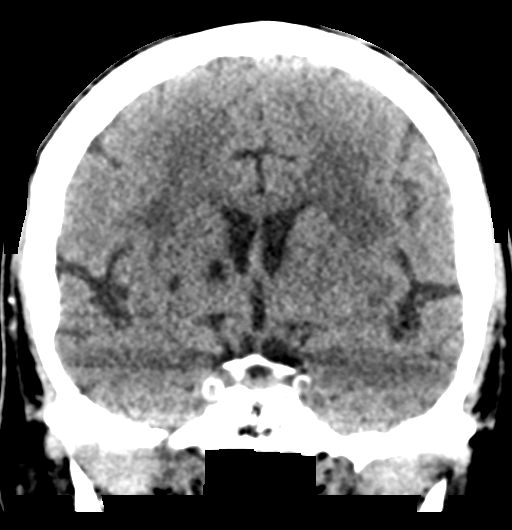

[Series 5: sagittal soft tissue · sagittal · 0.29mm/px · 3 of 49 slices shown]
[im 17/49  brain]
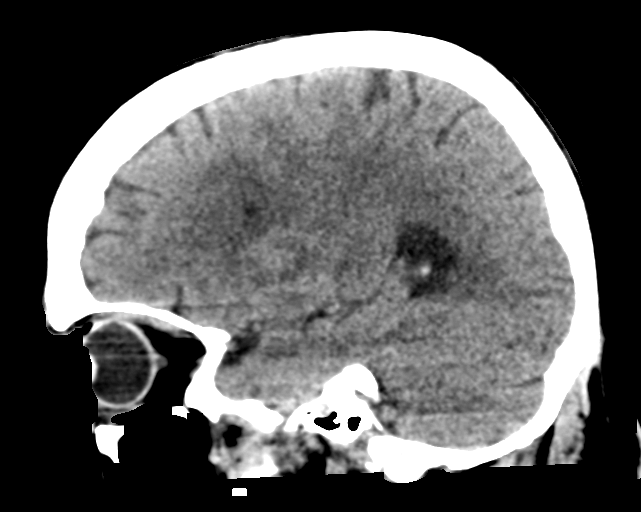
[im 25/49  brain]
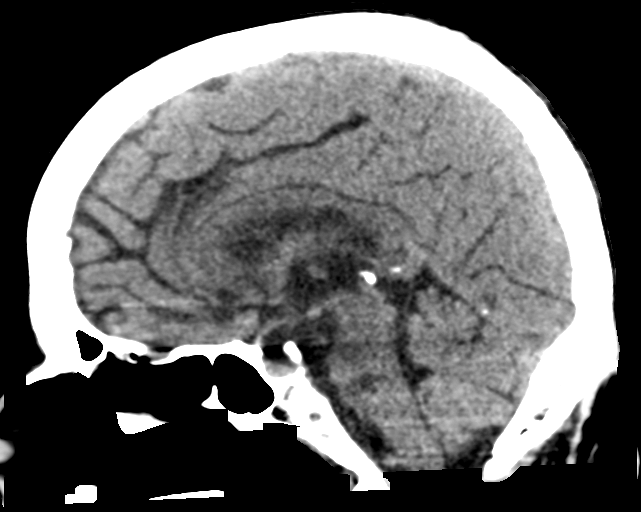
[im 33/49  brain]
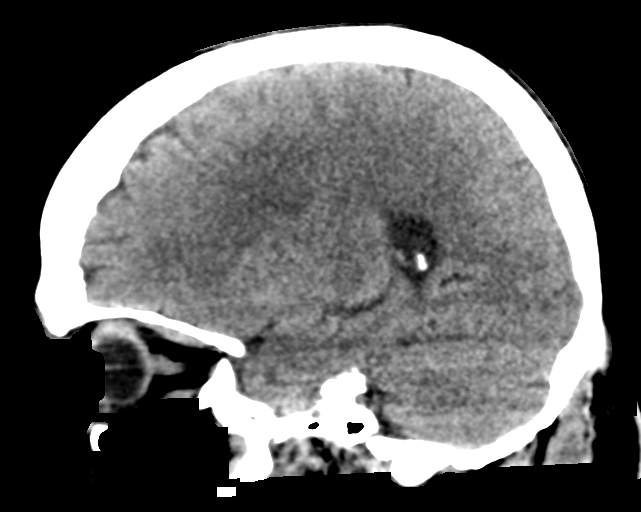

[16 of 46 positions shown; findings below may reference images not displayed]

FINDINGS: Brain: The ventricles and sulci appropriate size for patient's age.
Moderate periventricular and deep white matter chronic microvascular
ischemic changes similar in appearance to the prior CT. Small old
right basal ganglia lacunar infarcts. There is no acute intracranial
hemorrhage. Mass effect or midline shift. No extra-axial fluid
collection.

Vascular: No hyperdense vessel or unexpected calcification.

Skull: Normal. Negative for fracture or focal lesion.

Sinuses/Orbits: No acute finding.

Other: None
IMPRESSION: 1. No acute intracranial pathology.
2. Chronic microvascular ischemic changes similar to prior CT.

## 2021-02-01 IMAGING — MR MR HEAD W/O CM
11 series · 42 of 48 positions shown · non-contrast
Comparison: CT head without contrast 02/18/2019 and 10/12/2018. MR
head without contrast 06/15/2018

CLINICAL DATA: Left lower extremity weakness.

EXAM:
MRI HEAD WITHOUT CONTRAST
TECHNIQUE: Multiplanar, multiecho pulse sequences of the brain and surrounding
structures were obtained without intravenous contrast.

[Series 5: ax dwi_tracew · axial · 3.0mm · 0.60mm/px · z∈[-99,+36]mm · 4 of 44 slices shown]
[im 1/44]
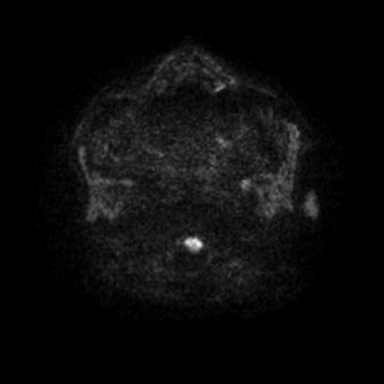
[im 15/44]
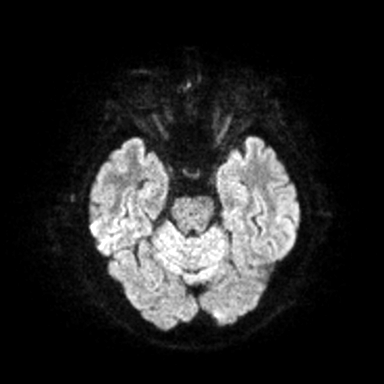
[im 29/44]
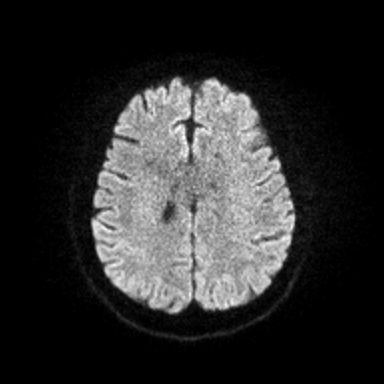
[im 44/44]
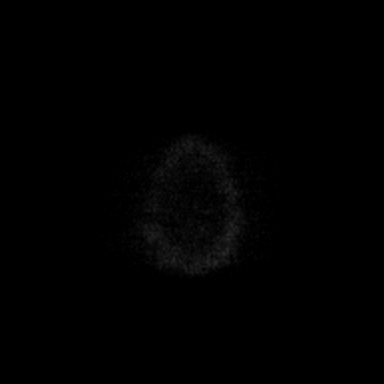

[Series 6: ax dwi_adc · axial · 3.0mm · 0.60mm/px · z∈[-99,+33]mm · 4 of 43 slices shown]
[im 1/43]
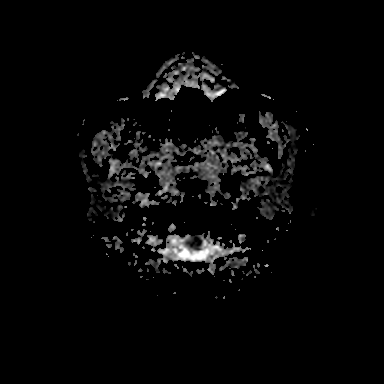
[im 15/43]
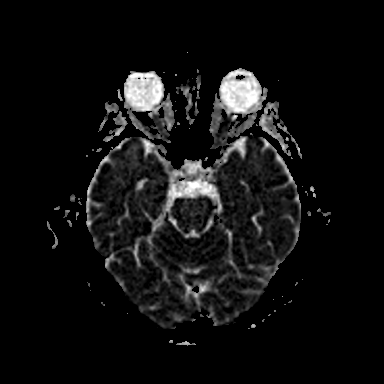
[im 29/43]
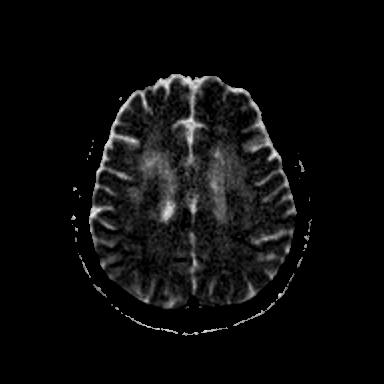
[im 43/43]
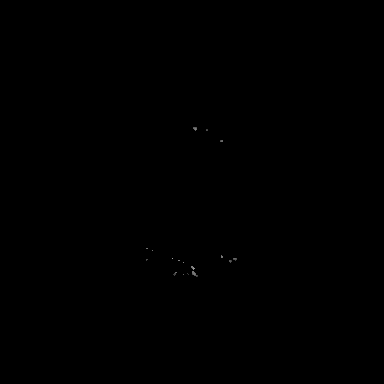

[Series 7: cor dwi_tracew · coronal · 5.0mm · 0.60mm/px · 3 of 34 slices shown]
[im 1/34]
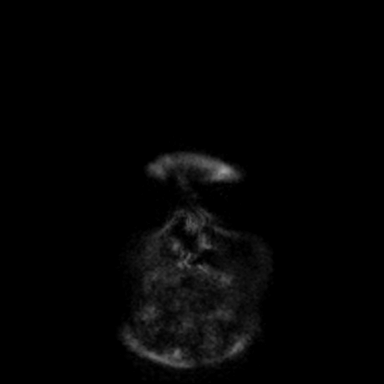
[im 17/34]
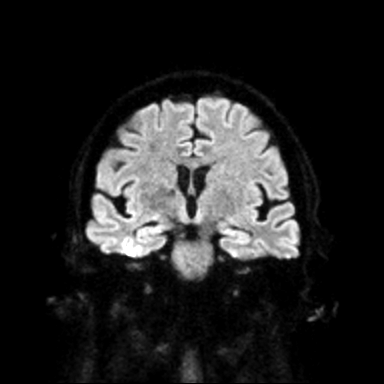
[im 34/34]
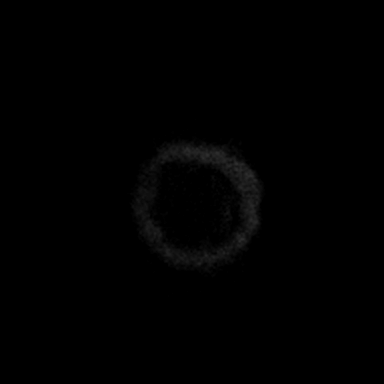

[Series 8: cor dwi_adc · coronal · 5.0mm · 0.60mm/px · 3 of 33 slices shown]
[im 1/33]
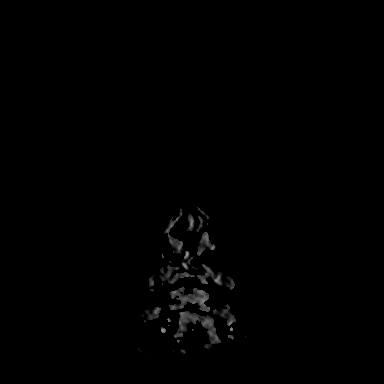
[im 17/33]
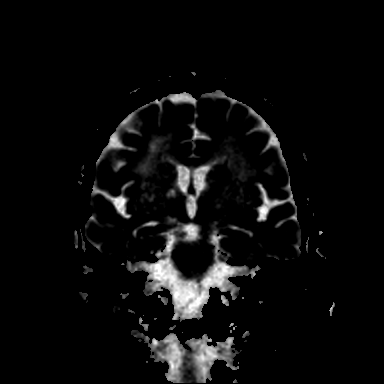
[im 33/33]
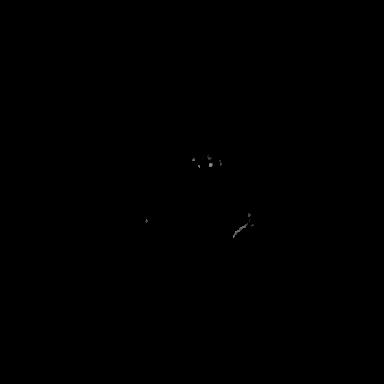

[Series 9: T1 · sagittal · 5.0mm · 0.62mm/px · 2 of 21 slices shown (1 of 2)]
[im 1/21]
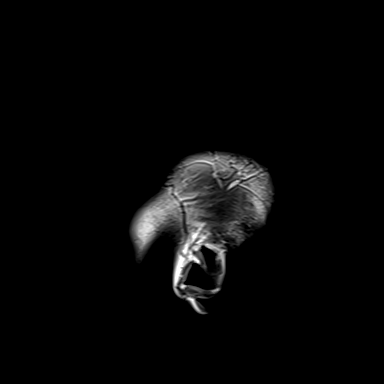
[im 21/21]
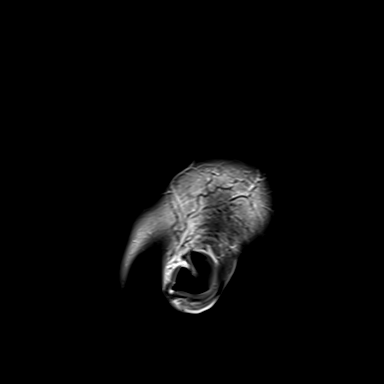

[Series 10: T2 · axial · 5.0mm · 0.53mm/px · z∈[-98,+38]mm · 2 of 25 slices shown (1 of 2)]
[im 1/25]
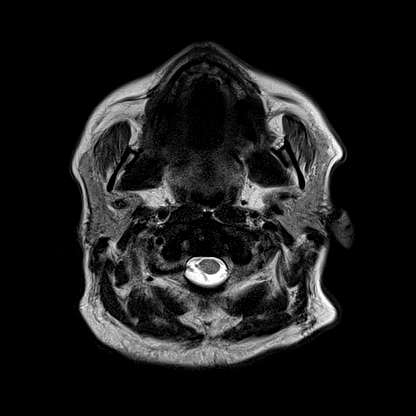
[im 25/25]
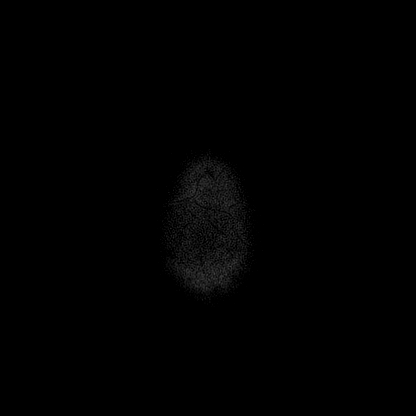

[Series 12: pha_images · axial · 3.0mm · 0.90mm/px · z∈[-112,+56]mm · 5 of 57 slices shown]
[im 1/57]
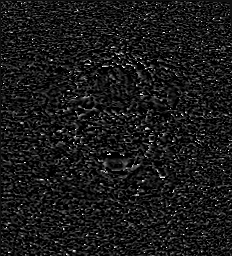
[im 15/57]
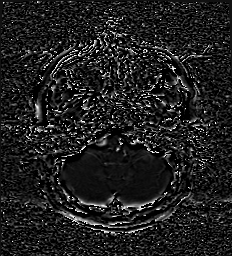
[im 29/57]
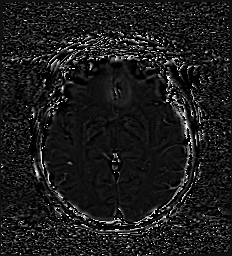
[im 43/57]
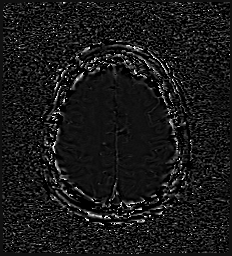
[im 57/57]
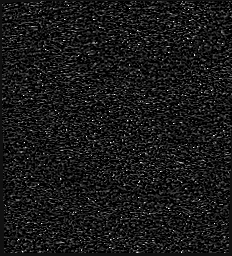

[Series 13: swi_images · axial · 3.0mm · 0.90mm/px · z∈[-112,+13]mm · 4 of 60 slices shown]
[im 1/60]
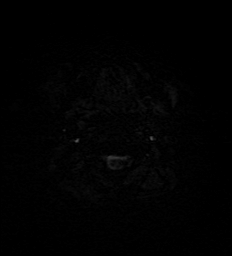
[im 15/60]
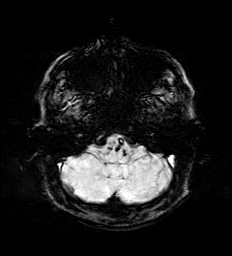
[im 30/60]
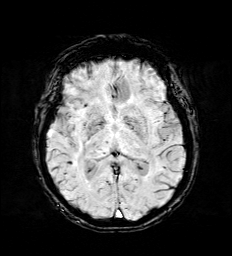
[im 45/60]
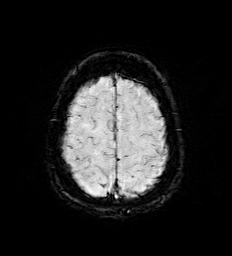

[Series 15: FLAIR · axial · 3.0mm · 0.53mm/px · z∈[-107,+46]mm · 5 of 55 slices shown]
[im 1/55]
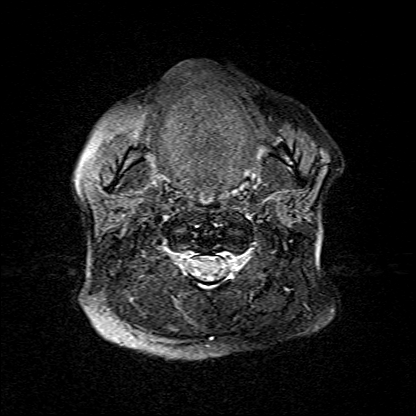
[im 14/55]
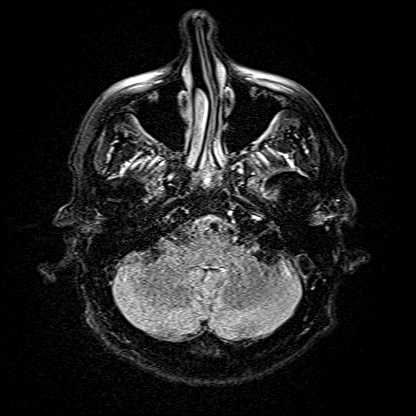
[im 28/55]
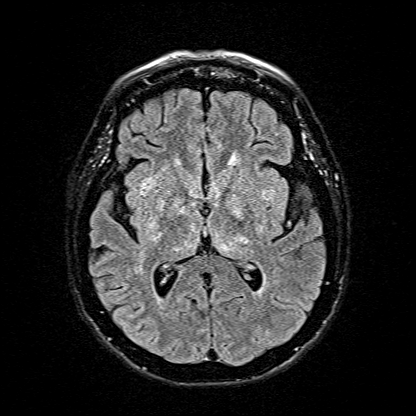
[im 41/55]
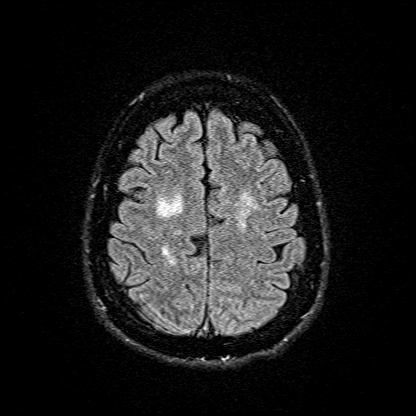
[im 55/55]
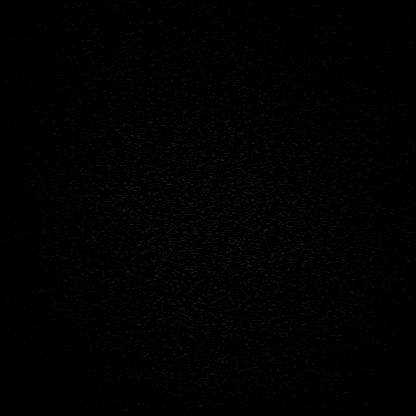

[Series 16: T1 · axial · 1.0mm · 0.98mm/px · z∈[-96,+39]mm · 8 of 144 slices shown (2 of 2)]
[im 1/144]
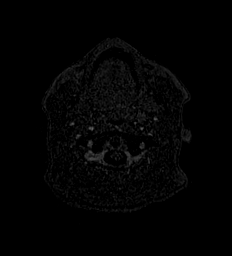
[im 24/144]
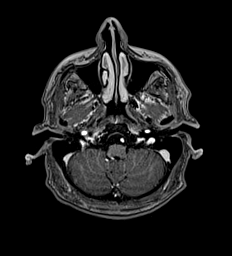
[im 48/144]
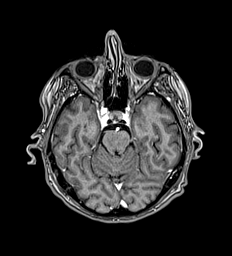
[im 60/144]
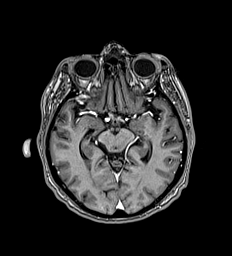
[im 84/144]
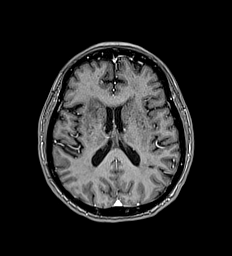
[im 96/144]
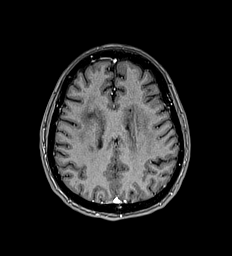
[im 120/144]
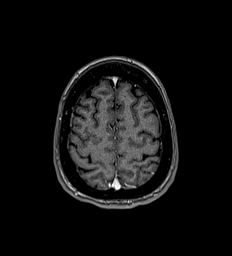
[im 144/144]
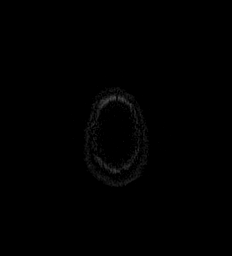

[Series 17: T2 · coronal · 5.0mm · 0.57mm/px · 2 of 27 slices shown (2 of 2)]
[im 1/27]
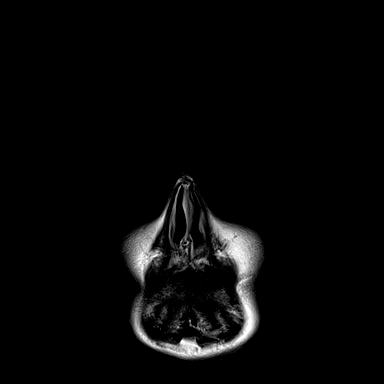
[im 27/27]
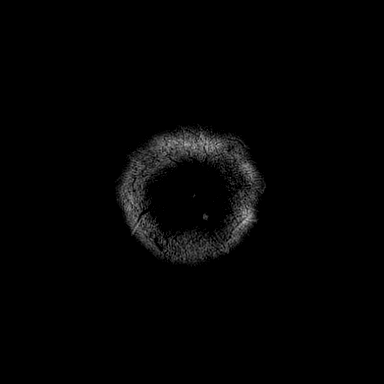

[42 of 48 positions shown; findings below may reference images not displayed]

FINDINGS: Brain: No acute infarct, hemorrhage, or mass lesion is present.
Multiple remote nonhemorrhagic lacunar infarcts are again noted
within the basal ganglia. Advanced confluent periventricular white
matter disease is present. Remote lacunar infarcts are present in
the corona radiata bilaterally. Remote lacunar infarcts are present
in the thalami and central pons.

No significant extraaxial fluid collection is present.

Vascular: Flow is present in the major intracranial arteries.

Skull and upper cervical spine: The craniocervical junction is
normal. Upper cervical spine is within normal limits. Marrow signal
is unremarkable.

Sinuses/Orbits: The paranasal sinuses and mastoid air cells are
clear. The globes and orbits are within normal limits.
IMPRESSION: 1. No acute intracranial abnormality or significant interval change.
2. Multiple remote lacunar infarcts of the basal ganglia and corona
radiata bilaterally.
3. Advanced confluent periventricular white matter disease. This
likely reflects the sequela of chronic microvascular ischemia.

## 2021-02-04 ENCOUNTER — Telehealth: Payer: Self-pay | Admitting: Neurology

## 2021-02-04 ENCOUNTER — Encounter: Payer: Self-pay | Admitting: Neurology

## 2021-02-04 NOTE — Telephone Encounter (Signed)
Form received from [facility]. It requires the patient and Dr. Rhea Belton signature in order for them to process the genetic testing (Notch 3 - [place]- sequencing).  I called the patient and she will come to the office on 02/05/21.

## 2021-02-04 NOTE — Telephone Encounter (Signed)
Samad with [name] called sating they are needed a form faxed back that was sent to [place] one day last week. Ref#: [id number] V, cal back number [phone] press option for client services.

## 2021-02-04 NOTE — Telephone Encounter (Signed)
I spoke to the patient. Dr. Krista Blue sent her a mychart message letting her know the CSF showed no significant abnormalities. She verbalized understanding of this information.   She would also like to get her EEG results. She is aware we will notify her once they are available.

## 2021-02-04 NOTE — Telephone Encounter (Signed)
I called [facility] back and spoke to rep, Levada Dy. She will refax the form for Dr. Rhea Belton completion.

## 2021-02-05 ENCOUNTER — Encounter: Payer: Self-pay | Admitting: Oncology

## 2021-02-05 ENCOUNTER — Telehealth: Payer: Self-pay

## 2021-02-05 NOTE — Telephone Encounter (Signed)
Mr. Tamala Julian advised.   Thanks,   -Mickel Baas

## 2021-02-05 NOTE — Telephone Encounter (Signed)
Copied from [place] [id number]. Topic: Quick Communication - Home Health Verbal Orders >> Feb 05, 2021 10:27 AM Tessa Lerner A wrote: Caller/Agency: Raeanne Barry / [facility]   Callback Number: [phone]  Requesting OT/PT/Skilled Nursing/Social Work/Speech Therapy: Social Work Evaluation   Frequency:

## 2021-02-05 NOTE — Procedures (Signed)
° °  HISTORY: 52 year old female, complains of frequent migraine headaches, MRI of the brain showed extensive supratentorium T2/FLAIR hyperintensity involvement  TECHNIQUE:  This is a routine 16 channel EEG recording with one channel devoted to a limited EKG recording.  It was performed during wakefulness, drowsiness and asleep.  Hyperventilation and photic stimulation were performed as activating procedures.  There are minimum muscle and movement artifact noted.  Upon maximum arousal, posterior dominant waking rhythm consistent of rhythmic alpha range activity, with frequency of 8 Hz. Activities are symmetric over the bilateral posterior derivations and attenuated with eye opening.  Hyperventilation produced mild/moderate buildup with higher amplitude and the slower activities noted.  Photic stimulation did not alter the tracing.  During EEG recording, patient developed drowsiness and no deeper stage of sleep was achieved.  During EEG recording, there was no epileptiform discharge noted.  EKG demonstrate sinus rhythm, with heart rate of 64 bpm  CONCLUSION: This is a  normal awake EEG.  There is no electrodiagnostic evidence of epileptiform discharge.  Marcial Pacas, M.D. Ph.D.  [facility] [place], [place] [address] Phone: [phone] Fax:      [phone]

## 2021-02-05 NOTE — Telephone Encounter (Signed)
That's fine

## 2021-02-05 NOTE — Telephone Encounter (Signed)
The patient came to our office and signed the informed consent form. Dr. Krista Blue has signed too. It has been faxed and confirmed back to [facility] so they can process the test.

## 2021-02-07 ENCOUNTER — Emergency Department: Payer: Medicare Other

## 2021-02-07 ENCOUNTER — Inpatient Hospital Stay
Admission: EM | Admit: 2021-02-07 | Discharge: 2021-02-08 | DRG: 101 | Disposition: A | Discharge status: Other Acute Inpatient Hospital | Payer: Medicare Other | Attending: Hospitalist | Admitting: Hospitalist

## 2021-02-07 ENCOUNTER — Other Ambulatory Visit: Payer: Self-pay

## 2021-02-07 DIAGNOSIS — Z818 Family history of other mental and behavioral disorders: Secondary | ICD-10-CM

## 2021-02-07 DIAGNOSIS — Z87891 Personal history of nicotine dependence: Secondary | ICD-10-CM

## 2021-02-07 DIAGNOSIS — Z808 Family history of malignant neoplasm of other organs or systems: Secondary | ICD-10-CM

## 2021-02-07 DIAGNOSIS — Z888 Allergy status to other drugs, medicaments and biological substances status: Secondary | ICD-10-CM | POA: Diagnosis not present

## 2021-02-07 DIAGNOSIS — Z7982 Long term (current) use of aspirin: Secondary | ICD-10-CM

## 2021-02-07 DIAGNOSIS — Z792 Long term (current) use of antibiotics: Secondary | ICD-10-CM

## 2021-02-07 DIAGNOSIS — Z8249 Family history of ischemic heart disease and other diseases of the circulatory system: Secondary | ICD-10-CM

## 2021-02-07 DIAGNOSIS — I5022 Chronic systolic (congestive) heart failure: Secondary | ICD-10-CM | POA: Diagnosis present

## 2021-02-07 DIAGNOSIS — R29818 Other symptoms and signs involving the nervous system: Secondary | ICD-10-CM | POA: Diagnosis not present

## 2021-02-07 DIAGNOSIS — I7122 Aneurysm of the aortic arch, without rupture: Secondary | ICD-10-CM | POA: Diagnosis present

## 2021-02-07 DIAGNOSIS — B9689 Other specified bacterial agents as the cause of diseases classified elsewhere: Secondary | ICD-10-CM | POA: Diagnosis present

## 2021-02-07 DIAGNOSIS — Z7902 Long term (current) use of antithrombotics/antiplatelets: Secondary | ICD-10-CM

## 2021-02-07 DIAGNOSIS — Z8619 Personal history of other infectious and parasitic diseases: Secondary | ICD-10-CM

## 2021-02-07 DIAGNOSIS — F418 Other specified anxiety disorders: Secondary | ICD-10-CM | POA: Diagnosis present

## 2021-02-07 DIAGNOSIS — R7989 Other specified abnormal findings of blood chemistry: Secondary | ICD-10-CM | POA: Diagnosis present

## 2021-02-07 DIAGNOSIS — N390 Urinary tract infection, site not specified: Secondary | ICD-10-CM | POA: Diagnosis present

## 2021-02-07 DIAGNOSIS — I130 Hypertensive heart and chronic kidney disease with heart failure and stage 1 through stage 4 chronic kidney disease, or unspecified chronic kidney disease: Secondary | ICD-10-CM | POA: Diagnosis present

## 2021-02-07 DIAGNOSIS — F419 Anxiety disorder, unspecified: Secondary | ICD-10-CM | POA: Diagnosis present

## 2021-02-07 DIAGNOSIS — I69354 Hemiplegia and hemiparesis following cerebral infarction affecting left non-dominant side: Secondary | ICD-10-CM | POA: Diagnosis not present

## 2021-02-07 DIAGNOSIS — E119 Type 2 diabetes mellitus without complications: Secondary | ICD-10-CM

## 2021-02-07 DIAGNOSIS — I161 Hypertensive emergency: Secondary | ICD-10-CM | POA: Diagnosis present

## 2021-02-07 DIAGNOSIS — Z20822 Contact with and (suspected) exposure to covid-19: Secondary | ICD-10-CM | POA: Diagnosis present

## 2021-02-07 DIAGNOSIS — I2510 Atherosclerotic heart disease of native coronary artery without angina pectoris: Secondary | ICD-10-CM | POA: Diagnosis present

## 2021-02-07 DIAGNOSIS — Z789 Other specified health status: Secondary | ICD-10-CM

## 2021-02-07 DIAGNOSIS — F32A Depression, unspecified: Secondary | ICD-10-CM | POA: Diagnosis present

## 2021-02-07 DIAGNOSIS — Q278 Other specified congenital malformations of peripheral vascular system: Secondary | ICD-10-CM

## 2021-02-07 DIAGNOSIS — Z833 Family history of diabetes mellitus: Secondary | ICD-10-CM

## 2021-02-07 DIAGNOSIS — E039 Hypothyroidism, unspecified: Secondary | ICD-10-CM | POA: Diagnosis present

## 2021-02-07 DIAGNOSIS — R4182 Altered mental status, unspecified: Secondary | ICD-10-CM

## 2021-02-07 DIAGNOSIS — Z9884 Bariatric surgery status: Secondary | ICD-10-CM | POA: Diagnosis not present

## 2021-02-07 DIAGNOSIS — Z79890 Hormone replacement therapy (postmenopausal): Secondary | ICD-10-CM

## 2021-02-07 DIAGNOSIS — G40901 Epilepsy, unspecified, not intractable, with status epilepticus: Secondary | ICD-10-CM | POA: Diagnosis present

## 2021-02-07 DIAGNOSIS — I6930 Unspecified sequelae of cerebral infarction: Secondary | ICD-10-CM

## 2021-02-07 DIAGNOSIS — E1122 Type 2 diabetes mellitus with diabetic chronic kidney disease: Secondary | ICD-10-CM | POA: Diagnosis present

## 2021-02-07 DIAGNOSIS — N1832 Chronic kidney disease, stage 3b: Secondary | ICD-10-CM

## 2021-02-07 DIAGNOSIS — Z83438 Family history of other disorder of lipoprotein metabolism and other lipidemia: Secondary | ICD-10-CM

## 2021-02-07 DIAGNOSIS — I10 Essential (primary) hypertension: Secondary | ICD-10-CM

## 2021-02-07 DIAGNOSIS — Z841 Family history of disorders of kidney and ureter: Secondary | ICD-10-CM

## 2021-02-07 DIAGNOSIS — I160 Hypertensive urgency: Secondary | ICD-10-CM

## 2021-02-07 DIAGNOSIS — N319 Neuromuscular dysfunction of bladder, unspecified: Secondary | ICD-10-CM | POA: Diagnosis present

## 2021-02-07 DIAGNOSIS — Z955 Presence of coronary angioplasty implant and graft: Secondary | ICD-10-CM

## 2021-02-07 DIAGNOSIS — Z8051 Family history of malignant neoplasm of kidney: Secondary | ICD-10-CM

## 2021-02-07 DIAGNOSIS — G379 Demyelinating disease of central nervous system, unspecified: Secondary | ICD-10-CM | POA: Diagnosis present

## 2021-02-07 DIAGNOSIS — G43909 Migraine, unspecified, not intractable, without status migrainosus: Secondary | ICD-10-CM | POA: Diagnosis present

## 2021-02-07 DIAGNOSIS — N1831 Chronic kidney disease, stage 3a: Secondary | ICD-10-CM | POA: Diagnosis present

## 2021-02-07 DIAGNOSIS — N183 Chronic kidney disease, stage 3 unspecified: Secondary | ICD-10-CM

## 2021-02-07 DIAGNOSIS — Z823 Family history of stroke: Secondary | ICD-10-CM

## 2021-02-07 DIAGNOSIS — Z803 Family history of malignant neoplasm of breast: Secondary | ICD-10-CM

## 2021-02-07 DIAGNOSIS — Z800 Family history of malignant neoplasm of digestive organs: Secondary | ICD-10-CM

## 2021-02-07 DIAGNOSIS — I252 Old myocardial infarction: Secondary | ICD-10-CM | POA: Diagnosis not present

## 2021-02-07 DIAGNOSIS — Z8041 Family history of malignant neoplasm of ovary: Secondary | ICD-10-CM

## 2021-02-07 LAB — URINE DRUG SCREEN, QUALITATIVE (ARMC ONLY)
Amphetamines, Ur Screen: NOT DETECTED
Barbiturates, Ur Screen: NOT DETECTED
Benzodiazepine, Ur Scrn: POSITIVE — AB
Cannabinoid 50 Ng, Ur ~~LOC~~: NOT DETECTED
Cocaine Metabolite,Ur ~~LOC~~: NOT DETECTED
MDMA (Ecstasy)Ur Screen: NOT DETECTED
Methadone Scn, Ur: NOT DETECTED
Opiate, Ur Screen: NOT DETECTED
Phencyclidine (PCP) Ur S: NOT DETECTED
Tricyclic, Ur Screen: NOT DETECTED

## 2021-02-07 LAB — CBC
HCT: 49.1 % — ABNORMAL HIGH (ref 36.0–46.0)
Hemoglobin: 15.4 g/dL — ABNORMAL HIGH (ref 12.0–15.0)
MCH: 25.8 pg — ABNORMAL LOW (ref 26.0–34.0)
MCHC: 31.4 g/dL (ref 30.0–36.0)
MCV: 82.2 fL (ref 80.0–100.0)
Platelets: 167 10*3/uL (ref 150–400)
RBC: 5.97 MIL/uL — ABNORMAL HIGH (ref 3.87–5.11)
RDW: 14.6 % (ref 11.5–15.5)
WBC: 12.6 10*3/uL — ABNORMAL HIGH (ref 4.0–10.5)
nRBC: 0 % (ref 0.0–0.2)

## 2021-02-07 LAB — RESP PANEL BY RT-PCR (FLU A&B, COVID) ARPGX2
Influenza A by PCR: NEGATIVE
Influenza B by PCR: NEGATIVE
SARS Coronavirus 2 by RT PCR: NEGATIVE

## 2021-02-07 LAB — COMPREHENSIVE METABOLIC PANEL
ALT: 74 U/L — ABNORMAL HIGH (ref 0–44)
AST: 55 U/L — ABNORMAL HIGH (ref 15–41)
Albumin: 4.1 g/dL (ref 3.5–5.0)
Alkaline Phosphatase: 172 U/L — ABNORMAL HIGH (ref 38–126)
Anion gap: 9 (ref 5–15)
BUN: 16 mg/dL (ref 6–20)
CO2: 25 mmol/L (ref 22–32)
Calcium: 9.6 mg/dL (ref 8.9–10.3)
Chloride: 107 mmol/L (ref 98–111)
Creatinine, Ser: 1.32 mg/dL — ABNORMAL HIGH (ref 0.44–1.00)
GFR, Estimated: 49 mL/min — ABNORMAL LOW (ref >60)
Glucose, Bld: 160 mg/dL — ABNORMAL HIGH (ref 70–99)
Potassium: 4.5 mmol/L (ref 3.5–5.1)
Sodium: 141 mmol/L (ref 135–145)
Total Bilirubin: 0.7 mg/dL (ref 0.3–1.2)
Total Protein: 7.6 g/dL (ref 6.5–8.1)

## 2021-02-07 LAB — URINALYSIS, COMPLETE (UACMP) WITH MICROSCOPIC
Bilirubin Urine: NEGATIVE
Glucose, UA: NEGATIVE mg/dL
Ketones, ur: NEGATIVE mg/dL
Nitrite: NEGATIVE
Protein, ur: 100 mg/dL — AB
Specific Gravity, Urine: 1.017 (ref 1.005–1.030)
pH: 5 (ref 5.0–8.0)

## 2021-02-07 LAB — LACTIC ACID, PLASMA: Lactic Acid, Venous: 1.6 mmol/L (ref 0.5–1.9)

## 2021-02-07 LAB — CBG MONITORING, ED: Glucose-Capillary: 156 mg/dL — ABNORMAL HIGH (ref 70–99)

## 2021-02-07 LAB — TROPONIN I (HIGH SENSITIVITY): Troponin I (High Sensitivity): 16 ng/L (ref <18)

## 2021-02-07 MED ORDER — SODIUM CHLORIDE 0.9 % IV SOLN
500.0000 mg | INTRAVENOUS | Status: DC
  Administered 2021-02-07: 500 mg via INTRAVENOUS
  Filled 2021-02-07: qty 5

## 2021-02-07 MED ORDER — ASPIRIN 81 MG PO CHEW
324.0000 mg | CHEWABLE_TABLET | Freq: Once | ORAL | Status: DC
  Filled 2021-02-07: qty 4

## 2021-02-07 MED ORDER — SODIUM CHLORIDE 0.9 % IV SOLN
2.0000 g | INTRAVENOUS | Status: DC
  Administered 2021-02-07: 2 g via INTRAVENOUS
  Filled 2021-02-07 (×2): qty 20

## 2021-02-07 MED ORDER — SODIUM CHLORIDE 0.9 % IV BOLUS
1000.0000 mL | Freq: Once | INTRAVENOUS | Status: AC
  Administered 2021-02-07: 1000 mL via INTRAVENOUS

## 2021-02-07 MED ORDER — LABETALOL HCL 5 MG/ML IV SOLN
10.0000 mg | Freq: Once | INTRAVENOUS | Status: AC
  Administered 2021-02-07: 10 mg via INTRAVENOUS
  Filled 2021-02-07: qty 4

## 2021-02-07 MED ORDER — LACTATED RINGERS IV SOLN: INTRAVENOUS | Status: DC

## 2021-02-07 MED ORDER — IOHEXOL 350 MG/ML SOLN
75.0000 mL | Freq: Once | INTRAVENOUS | Status: AC | PRN
  Administered 2021-02-07: 75 mL via INTRAVENOUS

## 2021-02-07 NOTE — ED Provider Notes (Signed)
[facility] Provider Note    Event Date/Time   First MD Initiated Contact with Patient 02/07/21 1831     (approximate)   History   Altered Mental Status   HPI  Level V Caveat: AMS  [name] is a 51 y.o. female extensive past medical history of recurrent UTIs as well as CVA and neuromuscular disorder presents to the ER for evaluation of confusion.  Most of the history was obtained after calling the patient's son, [name].  He states the patient's last seen normal was last night.  This morning he went to talk to her and said that she was more confused and seemed a little out of it.  He was at work and then had friend go check on her said that she was still confused and so was brought to the ER.  Patient unable to provide much additional history.  Very repetitive she was reportedly recently treated for UTI with antibiotics.  He states she had similar symptoms related to sepsis and a urinary tract infection but has also required mission the hospital for stroke.  Reportedly is very compliant with her medications.     Physical Exam   Triage Vital Signs: ED Triage Vitals  Enc Vitals Group     BP 02/07/21 1224 (!) 197/96     Pulse Rate 02/07/21 1224 62     Resp 02/07/21 1224 16     Temp 02/07/21 1319 98.1 F (36.7 C)     Temp Source 02/07/21 1319 Oral     SpO2 02/07/21 1224 99 %     Weight 02/07/21 1225 158 lb (71.7 kg)     Height 02/07/21 1225 5\' 3"  (1.6 m)     Head Circumference --      Peak Flow --      Pain Score --      Pain Loc --      Pain Edu? --      Excl. in [place]? --     Most recent vital signs: Vitals:   02/07/21 1945 02/07/21 2230  BP: (!) 192/131 (!) 246/112  Pulse: (!) 58 75  Resp: 14 14  Temp:    SpO2: 100% 99%     Constitutional: Alert but perseverative,  Eyes: Conjunctivae are normal.  Head: Atraumatic. Nose: No congestion/rhinnorhea. Mouth/Throat: Mucous membranes are moist.   Neck: Painless ROM.  Cardiovascular:    Good peripheral circulation. Respiratory: Normal respiratory effort.  No retractions.  Gastrointestinal: Soft and nontender.  Musculoskeletal:  no deformity Neurologic:  CNI, perseverative speech, answers questions yes no appropriately.  Alble to hold bue against gravity but with lue drift, able to raise bilate le.  SILT throughout.  Able to hold BUE against gravity.  kin:  Skin is warm, dry and intact. No rash noted. Psychiatric: Mood and affect are normal. Speech and behavior are normal.    ED Results / Procedures / Treatments   Labs (all labs ordered are listed, but only abnormal results are displayed) Labs Reviewed  COMPREHENSIVE METABOLIC PANEL - Abnormal; Notable for the following components:      Result Value   Glucose, Bld 160 (*)    Creatinine, Ser 1.32 (*)    AST 55 (*)    ALT 74 (*)    Alkaline Phosphatase 172 (*)    GFR, Estimated 49 (*)    All other components within normal limits  CBC - Abnormal; Notable for the following components:   WBC 12.6 (*)  RBC 5.97 (*)    Hemoglobin 15.4 (*)    HCT 49.1 (*)    MCH 25.8 (*)    All other components within normal limits  URINALYSIS, COMPLETE (UACMP) WITH MICROSCOPIC - Abnormal; Notable for the following components:   Color, Urine YELLOW (*)    APPearance HAZY (*)    Hgb urine dipstick MODERATE (*)    Protein, ur 100 (*)    Leukocytes,Ua SMALL (*)    Bacteria, UA RARE (*)    All other components within normal limits  URINE DRUG SCREEN, QUALITATIVE ([facility] ONLY) - Abnormal; Notable for the following components:   Benzodiazepine, Ur Scrn POSITIVE (*)    All other components within normal limits  CBG MONITORING, ED - Abnormal; Notable for the following components:   Glucose-Capillary 156 (*)    All other components within normal limits  RESP PANEL BY RT-PCR (FLU A&B, COVID) ARPGX2  CULTURE, BLOOD (ROUTINE X 2)  CULTURE, BLOOD (ROUTINE X 2)  LACTIC ACID, PLASMA  LACTIC ACID, PLASMA  TROPONIN I (HIGH SENSITIVITY)      EKG  ED ECG REPORT I, Merlyn Lot, the attending physician, personally viewed and interpreted this ECG.   Date: 02/07/2021  EKG Time: 12:27  Rate: 60  Rhythm: sinus  Axis: normal  Intervals: normal intervals,  ST&T Change: no stemi, poor r wave progression    RADIOLOGY Please see ED Course for my review and interpretation.  I personally reviewed all radiographic images ordered to evaluate for the above acute complaints and reviewed radiology reports and findings.  These findings were personally discussed with the patient.  Please see medical record for radiology report.    PROCEDURES:  Critical Care performed: No  Procedures   MEDICATIONS ORDERED IN ED: Medications  lactated ringers infusion (has no administration in time range)  cefTRIAXone (ROCEPHIN) 2 g in sodium chloride 0.9 % 100 mL IVPB (0 g Intravenous Stopped 02/07/21 2305)  azithromycin (ZITHROMAX) 500 mg in sodium chloride 0.9 % 250 mL IVPB (has no administration in time range)  aspirin chewable tablet 324 mg (has no administration in time range)  labetalol (NORMODYNE) injection 10 mg (has no administration in time range)  sodium chloride 0.9 % bolus 1,000 mL (0 mLs Intravenous Stopped 02/07/21 2123)  iohexol (OMNIPAQUE) 350 MG/ML injection 75 mL (75 mLs Intravenous Contrast Given 02/07/21 2037)     IMPRESSION / MDM / ASSESSMENT AND PLAN / ED COURSE  I reviewed the triage vital signs and the nursing notes.                              Differential diagnosis includes, but is not limited to, cva, tia, hypoglycemia, dehydration, electrolyte abnormality, dissection, sepsis  Patient presented to the ER with altered mental status last seen normal last night outside the window for tPA.  CTA ordered given history.  No report of trauma.  Also with recent treatment for UTI.  No hypoxia, possible pneumonia.  Does have leukocytosis will give IV fluids noted to be hypertensive doubt bleed.  The patient will be  placed on continuous pulse oximetry and telemetry for monitoring.  Laboratory evaluation will be sent to evaluate for the above complaints.      Clinical Course as of 02/07/21 2319  Fri Feb 07, 2021  2131 My review of CT head does not show any evidence of acute bleed.  Per radiology report no sign of acute LVO. [PR]  2232 Blood  pressure noted to be elevated given concern for possible cva vs infection or pneumonia we will continue to observe and temporarily allow permissive hypertension till urinalysis back.  Patient will require hospitalization. [PR]  2311 Urinalysis is concerning for UTI versus cystitis.  UDS is positive for benzos.  Case discussed in consultation with hospitalist for admission. [PR]  2317 Blood pressure significantly elevated have ordered IV labetalol [PR]    Clinical Course User Index [PR] Merlyn Lot, MD     FINAL CLINICAL IMPRESSION(S) / ED DIAGNOSES   Final diagnoses:  Altered mental status, unspecified altered mental status type  Hypertension, unspecified type     Rx / DC Orders   ED Discharge Orders     None        Note:  This document was prepared using Dragon voice recognition software and may include unintentional dictation errors.    Merlyn Lot, MD 02/07/21 2320

## 2021-02-07 NOTE — ED Provider Triage Note (Signed)
Emergency Medicine Provider Triage Evaluation Note  [place] , a 51 y.o. female  was evaluated in triage.  Presents via EMS, altered mental status, complaining of dysuria.  Review of Systems  Positive: Dysuria, altered mental status Negative: Unable to assess due to altered mental status  Physical Exam  BP (!) 197/96    Pulse 62    Resp 16    Ht 5\' 3"  (1.6 m)    Wt 71.7 kg    SpO2 99%    BMI 27.99 kg/m  Gen:   Awake, no distress   Resp:  Normal effort  MSK:   Moves extremities without difficulty  Other:  Abdomen nontender  Medical Decision Making  Medically screening exam initiated at 12:30 PM.  Appropriate orders placed.  Lee was informed that the remainder of the evaluation will be completed by another provider, this initial triage assessment does not replace that evaluation, and the importance of remaining in the ED until their evaluation is complete.  With patient's altered mental status we will do septic work-up due to the dysuria.  Patient is in an adult diaper   Versie Starks, PA-C 02/07/21 1232

## 2021-02-07 NOTE — ED Notes (Signed)
Patient transported to CT 

## 2021-02-07 NOTE — ED Notes (Signed)
Unable to do stroke swallow screen because pt is not following commands

## 2021-02-07 NOTE — ED Triage Notes (Addendum)
Pt to ER via [facility] with reports of altered mental status. Pt alert, oriented to self only. Able to answer that she has been having frequent diarrhea and burning with urination. Denies other symptoms when asked.   Son [name] contacted by this rn, states that patient was confused before he left for work this morning and patient complained that she was feeling tired.

## 2021-02-07 NOTE — H&P (Signed)
History and Physical    [name] NWG:[id number] DOB: 1970/12/18 DOA: 02/07/2021  PCP: [name], MD   Patient coming from: home  I have personally briefly reviewed patient's relevant medical records in [place]  Chief Complaint: confusion  HPI: [name] is a 51 y.o. female with medical history significant for aberrant subclavian artery with recurrent CVA s/p carotid endarterectomy, poorly controlled HTN, CKD 3, anxiety and depression, CAD s/p stenting, migraines, , hypothyroidism, demyelinating disease, followed by neurology, last seen 01/17/2021 with n normal EEG on 01/28/2021, neurogenic bladder who self catheterizes 4 times daily, with history of of ESBL UTI on prophylactic antibiotics, history of gastric bypass with history of anastomotic ulcer currently followed by GI, who was brought to the ED with altered mental status, with last known normal on the night of 02/06/2021.  Patient is confused and unable to contribute to history .  ED course: Notable vitals include BP 197/96 on arrival going as high as 246/112 just prior to admission requiring IV labetalol Other vitals within normal limits Blood work: WBC 12,600 with lactic acid 1.6 Creatinine 1.32 which is at baseline.  Mildly elevated LFTs with AST 55, ALT 74 and alk phos 172 with normal bilirubin of 0.7 Troponin normal at 16 UDS positive for benzos Urinalysis weakly positive with small leukocyte esterase and rare bacteria COVID and flu negative  EKG, personally viewed and interpreted: NSR at 65 with no acute ST-T wave changes  Imaging CT angio head and neck with no emergent LVO, unchanged severe stenosis distal P2 segment of left posterior cerebral artery, right-sided aortic arch with aberrant left subclavian artery surgically occluded.  Patent graft between the left common carotid and left subclavian arteries CT angio head and neck also shows multifocal groundglass opacity right lung apex which  may indicate pneumonia  Chest x-ray clear  Patient was treated with aspirin 325, and started on Rocephin for possible UTI with azithromycin for possible pneumonia as seen on CT.  Hospitalist consulted for admission. MRI ordered prior to admit request, not yet done.   Review of Systems: Unable to contribute to history due to confusion  Assessment/Plan-    Acute focal neurological deficit/acute confusion -Differential includes CVA, acute metabolic encephalopathy , PRES, complex migraine, less likely seizure given normal EEG on 12/27 - CTA head and neck with stable chronic findings - Follow MRI to evaluate for CVA,PRES - Neurologic checks - Keep n.p.o. until cleared with swallow eval - Neurology consult  Possible CVA with history of recurrent CVA  History of carotid endarterectomy Aberrant subclavian artery/right aortic arch - Patient arrived outside tPA window with LKN 02/06/2021 - Continue Plavix and aspirin and rosuvastatin - Follow MRI - Permissive hypertension, maintain euglycemia and normothermia - Neurology consult - PT OT and speech therapy consults  Hypertensive urgency/emergency without CHF History of CHF with normal echo 12/08/2020 - BP 246/112 in ED - S/p IV labetalol in the ED - Continue home carvedilol, furosemide, Imdur - We will continue labetalol as needed to keep SBP under 220  Possible UTI Neurogenic bladder   Intermittent self-catheterization of bladder   History of ESBL E. coli infection - Patient received Rocephin in the ED - Antibiotics not continued at this time pending culture - Intermittent catheterization as needed with possible Foley placement if patient remains confused and unable to resume self cath  Possible multifocal pneumonia on CT - Patient not symptomatic for pneumonia - Received Rocephin and azithromycin in the ED - Antibiotics  not continued at this time pending procalcitonin - Follow procalcitonin  Demyelinating disease - Followed by  neurology, last seen December 2022 with MRI showing progressive disease - Continue Nurtec, baclofen, gabapentin - Increase nursing assistance    Depression with anxiety -Continue Pristiq, Wellbutrin, alprazolam, trazodone    Hypothyroidism - Continue levothyroxine    CAD (coronary artery disease) with history of stent - Continue aspirin, Plavix, rosuvastatin, isosorbide, nitroglycerin    Abnormal LFTs - Uncertain etiology - Continue to monitor with further work-up if uptrending    Diabetes mellitus without complication ([place]) - Sliding scale insulin coverage    Chronic kidney disease, stage III (moderate) (HCC) - Renal function at baseline  History of gastric bypass with anastomotic ulcer - Continue Protonix, Phenergan, Lomotil as needed, Baqsimi  Migraines - Continue Aimovig   DVT prophylaxis: Lovenox  Code Status: full code  Family Communication:  none  Disposition Plan: Back to previous home environment Consults called: neurology  Status:At the time of admission, it appears that the appropriate admission status for this patient is INPATIENT. This is judged to be reasonable and necessary in order to provide the required intensity of service to ensure the patient's safety given the presenting symptoms, physical exam findings, and initial radiographic and laboratory data in the context of their  Comorbid conditions.   Patient requires inpatient status due to high intensity of service, high risk for further deterioration and high frequency of surveillance required.   I certify that at the point of admission it is my clinical judgment that the patient will require inpatient hospital care spanning beyond 2 midnights     Physical Exam: Vitals:   02/07/21 1945 02/07/21 2230 02/07/21 2300 02/07/21 2330  BP: (!) 192/131 (!) 246/112 (!) 227/110 (!) 193/84  Pulse: (!) 58 75    Resp: 14 14    Temp:      TempSrc:      SpO2: 100% 99%    Weight:      Height:        Constitutional: Chronically ill-appearing.  Confused and appears unable to understand questions being asked. Not in any apparent distress HEENT:      Head: Normocephalic and atraumatic.         Eyes: PERLA, EOMI, Conjunctivae are normal. Sclera is non-icteric.       Mouth/Throat: Mucous membranes are moist.       Neck: Supple with no signs of meningismus. Cardiovascular: Regular rate and rhythm. No murmurs, gallops, or rubs. 2+ symmetrical distal pulses are present . No JVD. No  LE edema Respiratory: Respiratory effort normal .Lungs sounds clear bilaterally. No wheezes, crackles, or rhonchi.  Gastrointestinal: Soft, non tender, non distended. Positive bowel sounds.  Genitourinary: No CVA tenderness. Musculoskeletal: Nontender with normal range of motion in all extremities. No cyanosis, or erythema of extremities. Neurologic:  Face is symmetric. Moving all extremities. No gross focal neurologic deficits . Skin: Skin is warm, dry.  No rash or ulcers Psychiatric: Mood and affect are appropriate.  Appears confused    Past Medical History:  Diagnosis Date   Acute colitis 01/27/2017   Acute pyelonephritis    Acute upper GI bleed 01/25/2020   Anemia    iron deficiency anemia   Aortic arch aneurysm    BRCA negative 2014   CAD (coronary artery disease)    a. 08/2003 Cath: LAD 30-40-med Rx; b. 11/2014 PCI: LAD 85m(3.25x23 Xience Alpine DES); c. 06/2015 PCI: D1 ((0.96G83Resolute Integrity DES); d. 06/2017 PCI: Patent mLAD  stent, D2 95 (PTCA); e. 09/2017 PCI: D2 99ost (CBA); d. 12/2017 Cath: LM nl, LAD 66m 80d (small), D1 40ost, D2 95ost, LCX 40p, RCA 40ost/p->Med rx for D2 given restenosis.   Closed nondisplaced intertrochanteric fracture of left femur ([place] 10/12/2018   Colitis 06/03/2015   Colon polyp    CVA (cerebral vascular accident) ([facility]    Left side weakness.    Degenerative tear of glenoid labrum of right shoulder 03/15/2017   Diabetes mellitus without complication ([facility]    Family  history of breast cancer    BRCA neg 2014   Femur fracture, left ([facility] 10/12/2018   Gastric ulcer 32/22/9798  Helicobacter pylori infection 11/23/2014   History of echocardiogram    a. 03/2017 Echo: EF 60-65%, no rwma; b. 02/2018 Echo: EF 60-65%, no rwma. Nl RV fxn. No cardiac source of emboli (admitted w/ stroke).   Hypertension    Malignant melanoma of skin of scalp (HCC)    MI, acute, non ST segment elevation (HCC)    Neuromuscular disorder (HCC)    S/P drug eluting coronary stent placement 06/04/2015   Sepsis ([facility] 03/18/2017   Sepsis secondary to UTI ([facility] 04/23/2020   Stroke ([facility]    a. 02/2018 MRI: 73mlate acute/early subacute L medial frontal lobe inarct; b. 02/2018 MRA No large vessel occlusion or aneurysm. Mod to sev L P2 stenosis. thready L vertebral artery, diffusely dzs'd; c. 02/2018 Carotid U/S: <50% bilat ICA dzs.    Past Surgical History:  Procedure Laterality Date   APPENDECTOMY     BALLOON ENTEROSCOPY  02/06/2020   [facility]   BIOPSY N/A 03/14/2020   Procedure: BIOPSY;  Surgeon: WoLucilla LameMD;  [place] Service: Endoscopy;  Laterality: N/A;   CARDIAC CATHETERIZATION N/A 11/09/2014   Procedure: Coronary Angiography;  Surgeon: TiMinna MerrittsMD;  [place] [facility];  Service: Cardiovascular;  Laterality: N/A;   CARDIAC CATHETERIZATION N/A 11/12/2014   Procedure: Coronary Stent Intervention;  Surgeon: AlIsaias CowmanMD;  [place] [facility];  Service: Cardiovascular;  Laterality: N/A;   CARDIAC CATHETERIZATION N/A 04/18/2015   Procedure: Left Heart Cath and Coronary Angiography;  Surgeon: TiMinna MerrittsMD;  [place] [facility] [facility];  Service: Cardiovascular;  Laterality: N/A;   CARDIAC CATHETERIZATION Left 06/04/2015   Procedure: Left Heart Cath and Coronary Angiography;  Surgeon: MuWellington HampshireMD;  [place] [facility];  Service: Cardiovascular;  Laterality: Left;   CARDIAC CATHETERIZATION N/A 06/04/2015    Procedure: Coronary Stent Intervention;  Surgeon: MuWellington HampshireMD;  [place];  Service: Cardiovascular;  Laterality: N/A;   CESAREAN SECTION  2001   CHOLECYSTECTOMY N/A 11/18/2016   Procedure: LAPAROSCOPIC CHOLECYSTECTOMY WITH INTRAOPERATIVE CHOLANGIOGRAM;  Surgeon: SaChristene LyeMD;  [facility];  Service: General;  Laterality: N/A;   COLONOSCOPY WITH PROPOFOL N/A 04/27/2016   Procedure: COLONOSCOPY WITH PROPOFOL;  Surgeon: DaLucilla LameMD;  [place] Service: Endoscopy;  Laterality: N/A;   COLONOSCOPY WITH PROPOFOL N/A 01/12/2018   Procedure: COLONOSCOPY WITH PROPOFOL;  Surgeon: Toledo, TeBenay PikeMD;  [facility];  Service: Endoscopy;  Laterality: N/A;   COLONOSCOPY WITH PROPOFOL N/A 03/14/2020   Procedure: COLONOSCOPY WITH PROPOFOL;  Surgeon: WoLucilla LameMD;  [place] Service: Endoscopy;  Laterality: N/A;  Needs to be scheduled after 7:30 due to driver issues   CORONARY ANGIOPLASTY     CORONARY BALLOON ANGIOPLASTY N/A 06/29/2017  Procedure: CORONARY BALLOON ANGIOPLASTY;  Surgeon: Wellington Hampshire, MD;  Location: [place] CV LAB;  Service: Cardiovascular;  Laterality: N/A;   CORONARY BALLOON ANGIOPLASTY N/A 09/20/2017   Procedure: CORONARY BALLOON ANGIOPLASTY;  Surgeon: Wellington Hampshire, MD;  Location: [place] CV LAB;  Service: Cardiovascular;  Laterality: N/A;   CORONARY STENT INTERVENTION N/A 12/13/2019   Procedure: CORONARY STENT INTERVENTION;  Surgeon: Wellington Hampshire, MD;  Location: [place] CV LAB;  Service: Cardiovascular;  Laterality: N/A;   DILATION AND CURETTAGE OF UTERUS     ESOPHAGOGASTRODUODENOSCOPY (EGD) WITH PROPOFOL N/A 09/14/2014   Procedure: ESOPHAGOGASTRODUODENOSCOPY (EGD) WITH PROPOFOL;  Surgeon: Josefine Class, MD;  Location[facility] ENDOSCOPY;  Service: Endoscopy;  Laterality: N/A;   ESOPHAGOGASTRODUODENOSCOPY (EGD) WITH PROPOFOL N/A 04/27/2016    Procedure: ESOPHAGOGASTRODUODENOSCOPY (EGD) WITH PROPOFOL;  Surgeon: [name], MD;  Location: [place];  Service: Endoscopy;  Laterality: N/A;  Diabetic - oral meds   ESOPHAGOGASTRODUODENOSCOPY (EGD) WITH PROPOFOL N/A 01/12/2018   Procedure: ESOPHAGOGASTRODUODENOSCOPY (EGD) WITH PROPOFOL;  Surgeon: Toledo, Benay Pike, MD;  Location: [facility];  Service: Endoscopy;  Laterality: N/A;   ESOPHAGOGASTRODUODENOSCOPY (EGD) WITH PROPOFOL N/A 04/11/2019   Procedure: ESOPHAGOGASTRODUODENOSCOPY (EGD) WITH PROPOFOL;  Surgeon: [name], MD;  Location: [facility];  Service: Endoscopy;  Laterality: N/A;   ESOPHAGOGASTRODUODENOSCOPY (EGD) WITH PROPOFOL N/A 03/08/2020   Procedure: ESOPHAGOGASTRODUODENOSCOPY (EGD) WITH PROPOFOL;  Surgeon: [name], MD;  Location[facility];  Service: Endoscopy;  Laterality: N/A;   GASTRIC BYPASS  09/2009   [facility]    INTRAMEDULLARY (IM) NAIL INTERTROCHANTERIC Left 10/13/2018   Procedure: INTRAMEDULLARY (IM) NAIL INTERTROCHANTRIC;  Surgeon: Leandrew Koyanagi, MD;  Location: [place];  Service: Orthopedics;  Laterality: Left;   Left Carotid to sublcavian artery bypass w/ subclavian artery ligation     a. Performed @ [facility].   LEFT HEART CATH AND CORONARY ANGIOGRAPHY Left 06/29/2017   Procedure: LEFT HEART CATH AND CORONARY ANGIOGRAPHY;  Surgeon: Wellington Hampshire, MD;  Location: [place] CV LAB;  Service: Cardiovascular;  Laterality: Left;   LEFT HEART CATH AND CORONARY ANGIOGRAPHY N/A 09/20/2017   Procedure: LEFT HEART CATH AND CORONARY ANGIOGRAPHY;  Surgeon: Wellington Hampshire, MD;  Location: [place] CV LAB;  Service: Cardiovascular;  Laterality: N/A;   LEFT HEART CATH AND CORONARY ANGIOGRAPHY N/A 12/20/2017   Procedure: LEFT HEART CATH AND CORONARY ANGIOGRAPHY;  Surgeon: Wellington Hampshire, MD;  Location: [place] CV LAB;  Service: Cardiovascular;  Laterality: N/A;   LEFT HEART CATH AND CORONARY ANGIOGRAPHY N/A 04/20/2019    Procedure: LEFT HEART CATH AND CORONARY ANGIOGRAPHY possible PCI;  Surgeon: Minna Merritts, MD;  Location: [place] CV LAB;  Service: Cardiovascular;  Laterality: N/A;   LEFT HEART CATH AND CORONARY ANGIOGRAPHY N/A 12/13/2019   Procedure: LEFT HEART CATH AND CORONARY ANGIOGRAPHY;  Surgeon: Wellington Hampshire, MD;  Location: [place] CV LAB;  Service: Cardiovascular;  Laterality: N/A;   MELANOMA EXCISION  2016   Dr. Henreitta Cea ABLATION  2002   RIGHT OOPHORECTOMY     SHOULDER ARTHROSCOPY WITH OPEN ROTATOR CUFF REPAIR Right 01/07/2016   Procedure: SHOULDER ARTHROSCOPY WITH DEBRIDMENT, SUBACHROMIAL DECOMPRESSION;  Surgeon: Corky Mull, MD;  Location: [facility];  Service: Orthopedics;  Laterality: Right;   SHOULDER ARTHROSCOPY WITH OPEN ROTATOR CUFF REPAIR Right 03/16/2017   Procedure: SHOULDER ARTHROSCOPY WITH OPEN ROTATOR CUFF REPAIR POSSIBLE BICEPS TENODESIS;  Surgeon: Corky Mull, MD;  Location: [facility];  Service: Orthopedics;  Laterality: Right;   TRIGGER FINGER RELEASE Right     [place]     reports that she quit smoking about 26 years ago. Her smoking use included cigarettes. She has a 0.25 pack-year smoking history. She has never used smokeless tobacco. She reports that she does not drink alcohol and does not use drugs.  Allergies  Allergen Reactions   Lipitor [Atorvastatin] Other (See Comments)    Leg pains    Family History  Problem Relation Age of Onset   Hypertension Mother    Anxiety disorder Mother    Depression Mother    Bipolar disorder Mother    Heart disease Mother        No details   Hyperlipidemia Mother    Kidney disease Father    Heart disease Father 57   Hypertension Father    Diabetes Father    Stroke Father    Colon cancer Father        dx in his 71's   Anxiety disorder Father    Depression Father    Skin cancer Father    Kidney disease Sister    Thyroid nodules Sister    Hypertension Sister    Hypertension Sister    Diabetes  Sister    Hyperlipidemia Sister    Depression Sister    Breast cancer Maternal Aunt 45   Breast cancer Maternal Aunt 41   Ovarian cancer Cousin    Colon cancer Cousin    Kidney cancer Cousin    Breast cancer Other    Bladder Cancer Neg Hx      Prior to Admission medications   Medication Sig Start Date End Date Taking? Authorizing Provider  AIMOVIG 140 MG/ML SOAJ Inject into the skin. 10/14/20   [provider]  ALPRAZolam Duanne Moron) 1 MG tablet Take 1 tablet by mouth twice daily 11/26/20   [name], MD  aspirin EC 81 MG EC tablet Take 1 tablet (81 mg total) by mouth daily. Swallow whole. 12/10/20 02/08/21  Elodia Florence., MD  Baclofen 5 MG TABS Take 1 tablet by mouth 2 (two) times daily as needed. 11/26/20   [provider]  BAQSIMI ONE PACK 3 MG/DOSE POWD Place 1 spray into both nostrils as directed. 02/11/20   [provider]  benzonatate (TESSALON) 100 MG capsule Take 1 capsule (100 mg total) by mouth 3 (three) times daily as needed for cough. 01/23/21   Brunetta Jeans, PA-C  buPROPion [facility] SR) 100 MG 12 hr tablet Take 100 mg by mouth 2 (two) times daily. 05/03/20   [provider]  carvedilol (COREG) 12.5 MG tablet Take 1 tablet by mouth twice daily 08/21/20   Minna Merritts, MD  clopidogrel (PLAVIX) 75 MG tablet Take 1 tablet (75 mg total) by mouth daily. 11/20/20   Furth, Cadence H, PA-C  desvenlafaxine (PRISTIQ) 50 MG 24 hr tablet Take 50 mg by mouth daily. 11/26/20   [provider]  diclofenac Sodium (VOLTAREN) 1 % GEL Apply 2 g topically 4 (four) times daily.    [provider]  diphenoxylate-atropine (LOMOTIL) 2.5-0.025 MG tablet Take 1 tablet by mouth 4 (four) times daily as needed for diarrhea or loose stools. 01/07/21   [name], MD  doxycycline (VIBRA-TABS) 100 MG tablet Take 1 tablet (100 mg total) by mouth 2 (two) times daily. 01/23/21   Brunetta Jeans, PA-C  estradiol (ESTRACE) 0.1 MG/GM vaginal  cream Estrogen Cream Instruction Discard applicator Apply pea sized amount to  tip of finger to urethra before bed. Wash hands well after application. Use Monday, Wednesday and Friday 05/08/20   Billey Co, MD  furosemide (LASIX) 20 MG tablet Take 1 tablet by mouth every day as needed for swelling 12/13/20   Furth, Cadence H, PA-C  gabapentin (NEURONTIN) 300 MG capsule Take 2 capsules (600 mg total) by mouth 2 (two) times daily. 07/24/20   [name], MD  glucose 4 GM chewable tablet Chew 1 tablet (4 g total) by mouth 3 (three) times daily. 04/25/20   Fritzi Mandes, MD  isosorbide mononitrate (IMDUR) 120 MG 24 hr tablet Take 1 tablet by mouth every day 01/28/21   Minna Merritts, MD  levothyroxine (SYNTHROID) 25 MCG tablet Take 1 tablet by mouth 30 minutes before breakfast 11/26/20   [name], MD  nitrofurantoin (MACRODANTIN) 100 MG capsule Take 1 capsule (100 mg total) by mouth 2 (two) times daily. 12/30/20   Bjorn Loser, MD  nitroGLYCERIN (NITROSTAT) 0.4 MG SL tablet Dissolve 1 tablet under tongue every 41mns, up to 3 doses, for chest pain. Max 3 doses in 332ms. 12/30/20   GoMinna MerrittsMD  pantoprazole (PROTONIX) 40 MG tablet Take 1 tablet (40 mg total) by mouth 2 (two) times daily. 12/20/20 01/19/21  FiBirdie SonsMD  Polyethyl Glycol-Propyl Glycol (SYSTANE OP) Place 1 drop into both eyes daily as needed (dry eyes).    [provider]  promethazine (PHENERGAN) 25 MG tablet Take 1 tablet (25 mg total) by mouth every 6 (six) hours as needed for nausea or vomiting. 07/02/20   FiBirdie SonsMD  Rimegepant Sulfate (NURTEC) 75 MG TBDP Take 75 mg by mouth as needed. 01/17/21   YaMarcial PacasMD  rosuvastatin (CRESTOR) 40 MG tablet Take 1 tablet (40 mg total) by mouth daily at 6 PM. 10/25/19   Gollan, TiKathlene NovemberMD  traMADol (ULTRAM) 50 MG tablet Take 1 tablet (50 mg total) by mouth every 6 (six) hours as needed. 12/24/20   FiBirdie SonsMD  traZODone (DESYREL)  100 MG tablet Take 1 to 2 tablets by mouth at bedtime 08/21/20   FiBirdie SonsMD      Labs on Admission: I have personally reviewed following labs and imaging studies  CBC: Recent Labs  Lab 02/07/21 1227  WBC 12.6*  HGB 15.4*  HCT 49.1*  MCV 82.2  PLT 16174 Basic Metabolic Panel: Recent Labs  Lab 02/07/21 1227  NA 141  K 4.5  CL 107  CO2 25  GLUCOSE 160*  BUN 16  CREATININE 1.32*  CALCIUM 9.6   GFR: Estimated Creatinine Clearance: 47.3 mL/min (A) (by C-G formula based on SCr of 1.32 mg/dL (H)). Liver Function Tests: Recent Labs  Lab 02/07/21 1227  AST 55*  ALT 74*  ALKPHOS 172*  BILITOT 0.7  PROT 7.6  ALBUMIN 4.1   No results for input(s): LIPASE, AMYLASE in the last 168 hours. No results for input(s): AMMONIA in the last 168 hours. Coagulation Profile: No results for input(s): INR, PROTIME in the last 168 hours. Cardiac Enzymes: No results for input(s): CKTOTAL, CKMB, CKMBINDEX, TROPONINI in the last 168 hours. BNP (last 3 results) No results for input(s): PROBNP in the last 8760 hours. HbA1C: No results for input(s): HGBA1C in the last 72 hours. CBG: Recent Labs  Lab 02/07/21 1230  GLUCAP 156*   Lipid Profile: No results for input(s): CHOL, HDL, LDLCALC, TRIG, CHOLHDL, LDLDIRECT in the last 72 hours. Thyroid  Function Tests: No results for input(s): TSH, T4TOTAL, FREET4, T3FREE, THYROIDAB in the last 72 hours. Anemia Panel: No results for input(s): VITAMINB12, FOLATE, FERRITIN, TIBC, IRON, RETICCTPCT in the last 72 hours. Urine analysis:    Component Value Date/Time   COLORURINE YELLOW (A) 02/07/2021 2229   APPEARANCEUR HAZY (A) 02/07/2021 2229   APPEARANCEUR Clear 12/19/2020 1411   LABSPEC 1.017 02/07/2021 2229   LABSPEC 1.012 12/29/2013 1210   PHURINE 5.0 02/07/2021 2229   GLUCOSEU NEGATIVE 02/07/2021 2229   GLUCOSEU >=500 12/29/2013 1210   HGBUR MODERATE (A) 02/07/2021 2229   HGBUR negative 04/14/2010 1435   BILIRUBINUR NEGATIVE  02/07/2021 2229   BILIRUBINUR Negative 12/19/2020 1411   BILIRUBINUR Negative 12/29/2013 1210   KETONESUR NEGATIVE 02/07/2021 2229   PROTEINUR 100 (A) 02/07/2021 2229   UROBILINOGEN 0.2 04/18/2020 1326   UROBILINOGEN negative 04/14/2010 1435   NITRITE NEGATIVE 02/07/2021 2229   LEUKOCYTESUR SMALL (A) 02/07/2021 2229   LEUKOCYTESUR Negative 12/29/2013 1210    Radiological Exams on Admission: CT ANGIO HEAD NECK W WO CM  Result Date: 02/07/2021 CLINICAL DATA:  Encephalopathy EXAM: CT ANGIOGRAPHY HEAD AND NECK TECHNIQUE: Multidetector CT imaging of the head and neck was performed using the standard protocol during bolus administration of intravenous contrast. Multiplanar CT image reconstructions and MIPs were obtained to evaluate the vascular anatomy. Carotid stenosis measurements (when applicable) are obtained utilizing NASCET criteria, using the distal internal carotid diameter as the denominator. CONTRAST:  32m OMNIPAQUE IOHEXOL 350 MG/ML SOLN COMPARISON:  12/05/2020 FINDINGS: CT HEAD FINDINGS Brain: There is no mass, hemorrhage or extra-axial collection. The size and configuration of the ventricles and extra-axial CSF spaces are normal. There is an old right basal ganglia small vessel infarct. There is hypoattenuation of the periventricular white matter, most commonly indicating chronic ischemic microangiopathy. Skull: The visualized skull base, calvarium and extracranial soft tissues are normal. Sinuses/Orbits: No fluid levels or advanced mucosal thickening of the visualized paranasal sinuses. No mastoid or middle ear effusion. The orbits are normal. CTA NECK FINDINGS SKELETON: There is no bony spinal canal stenosis. No lytic or blastic lesion. OTHER NECK: Normal pharynx, larynx and major salivary glands. No cervical lymphadenopathy. Unremarkable thyroid gland. UPPER CHEST: Multifocal ground-glass opacity in the right lung apex. AORTIC ARCH: There is calcific atherosclerosis of the aortic arch. There  is no aneurysm, dissection or hemodynamically significant stenosis of the visualized portion of the aorta. There is a right-sided aortic arch with aberrant left subclavian artery, which is surgically occluded. Patent graft between the left common carotid and left subclavian arteries. RIGHT CAROTID SYSTEM: No dissection, occlusion or aneurysm. Mild atherosclerotic calcification at the carotid bifurcation without hemodynamically significant stenosis. LEFT CAROTID SYSTEM: Early bifurcation. No stenosis or other abnormality. VERTEBRAL ARTERIES: Right dominant configuration. Both origins are clearly patent. There is no dissection, occlusion or flow-limiting stenosis to the skull base (V1-V3 segments). CTA HEAD FINDINGS POSTERIOR CIRCULATION: --Vertebral arteries: Bilateral atherosclerotic calcification. --Inferior cerebellar arteries: Normal. --Basilar artery: Normal. --Superior cerebellar arteries: Normal. --Posterior cerebral arteries (PCA): Severe stenosis of the distal P2 segment of the left PCA, which remains patent distally. The right PCA is predominantly supplied by the posterior communicating artery. ANTERIOR CIRCULATION: --Intracranial internal carotid arteries: Atherosclerotic calcification of the internal carotid arteries at the skull base without hemodynamically significant stenosis. --Anterior cerebral arteries (ACA): Normal. Absent left A1 segment, normal variant --Middle cerebral arteries (MCA): Mild narrowing of the distal left M1 segment. Normal right MCA. VENOUS SINUSES: As permitted by contrast timing, patent. Review of the  MIP images confirms the above findings. IMPRESSION: 1. No emergent large vessel occlusion. 2. Unchanged severe stenosis of the distal P2 segment of the left posterior cerebral artery, which remains patent distally. 3. Right-sided aortic arch with aberrant left subclavian artery, which is surgically occluded. Patent graft between the left common carotid and left subclavian arteries.  4. Multifocal ground-glass opacity in the right lung apex, which may indicate pneumonia. Aortic Atherosclerosis (ICD10-I70.0). Electronically Signed   By: Ulyses Jarred M.D.   On: 02/07/2021 21:19   DG Chest Portable 1 View  Result Date: 02/07/2021 CLINICAL DATA:  Altered level of consciousness, weakness EXAM: PORTABLE CHEST 1 VIEW COMPARISON:  03/31/2020 FINDINGS: Single frontal view of the chest demonstrates an unremarkable cardiac silhouette. No acute airspace disease, effusion, or pneumothorax. No acute bony abnormalities. IMPRESSION: 1. No acute intrathoracic process. Electronically Signed   By: Randa Ngo M.D.   On: 02/07/2021 21:48       Athena Masse MD [facility]   02/07/2021, 11:39 PM

## 2021-02-08 ENCOUNTER — Inpatient Hospital Stay (HOSPITAL_COMMUNITY)
Admission: AD | Admit: 2021-02-08 | Discharge: 2021-02-11 | DRG: 101 | Disposition: A | Discharge status: Home Health | Payer: Medicare Other | Source: Other Acute Inpatient Hospital | Attending: Family Medicine | Admitting: Family Medicine

## 2021-02-08 ENCOUNTER — Inpatient Hospital Stay (HOSPITAL_COMMUNITY): Payer: Medicare Other

## 2021-02-08 ENCOUNTER — Encounter (HOSPITAL_COMMUNITY): Payer: Self-pay | Admitting: Hospitalist

## 2021-02-08 DIAGNOSIS — N390 Urinary tract infection, site not specified: Secondary | ICD-10-CM | POA: Diagnosis present

## 2021-02-08 DIAGNOSIS — R4182 Altered mental status, unspecified: Secondary | ICD-10-CM | POA: Diagnosis not present

## 2021-02-08 DIAGNOSIS — Z800 Family history of malignant neoplasm of digestive organs: Secondary | ICD-10-CM

## 2021-02-08 DIAGNOSIS — R29818 Other symptoms and signs involving the nervous system: Secondary | ICD-10-CM | POA: Diagnosis not present

## 2021-02-08 DIAGNOSIS — G40909 Epilepsy, unspecified, not intractable, without status epilepticus: Secondary | ICD-10-CM | POA: Diagnosis present

## 2021-02-08 DIAGNOSIS — I2510 Atherosclerotic heart disease of native coronary artery without angina pectoris: Secondary | ICD-10-CM | POA: Diagnosis present

## 2021-02-08 DIAGNOSIS — Z7982 Long term (current) use of aspirin: Secondary | ICD-10-CM

## 2021-02-08 DIAGNOSIS — R338 Other retention of urine: Secondary | ICD-10-CM | POA: Diagnosis present

## 2021-02-08 DIAGNOSIS — G8194 Hemiplegia, unspecified affecting left nondominant side: Secondary | ICD-10-CM | POA: Diagnosis present

## 2021-02-08 DIAGNOSIS — B9689 Other specified bacterial agents as the cause of diseases classified elsewhere: Secondary | ICD-10-CM | POA: Diagnosis present

## 2021-02-08 DIAGNOSIS — Z9884 Bariatric surgery status: Secondary | ICD-10-CM

## 2021-02-08 DIAGNOSIS — Z86010 Personal history of colonic polyps: Secondary | ICD-10-CM | POA: Diagnosis not present

## 2021-02-08 DIAGNOSIS — E119 Type 2 diabetes mellitus without complications: Secondary | ICD-10-CM | POA: Diagnosis not present

## 2021-02-08 DIAGNOSIS — I160 Hypertensive urgency: Secondary | ICD-10-CM | POA: Diagnosis present

## 2021-02-08 DIAGNOSIS — Z955 Presence of coronary angioplasty implant and graft: Secondary | ICD-10-CM | POA: Diagnosis not present

## 2021-02-08 DIAGNOSIS — R339 Retention of urine, unspecified: Secondary | ICD-10-CM | POA: Diagnosis not present

## 2021-02-08 DIAGNOSIS — G40901 Epilepsy, unspecified, not intractable, with status epilepticus: Principal | ICD-10-CM

## 2021-02-08 DIAGNOSIS — I252 Old myocardial infarction: Secondary | ICD-10-CM

## 2021-02-08 DIAGNOSIS — G9340 Encephalopathy, unspecified: Secondary | ICD-10-CM | POA: Diagnosis present

## 2021-02-08 DIAGNOSIS — E1122 Type 2 diabetes mellitus with diabetic chronic kidney disease: Secondary | ICD-10-CM | POA: Diagnosis present

## 2021-02-08 DIAGNOSIS — Z808 Family history of malignant neoplasm of other organs or systems: Secondary | ICD-10-CM

## 2021-02-08 DIAGNOSIS — N1832 Chronic kidney disease, stage 3b: Secondary | ICD-10-CM | POA: Diagnosis present

## 2021-02-08 DIAGNOSIS — I129 Hypertensive chronic kidney disease with stage 1 through stage 4 chronic kidney disease, or unspecified chronic kidney disease: Secondary | ICD-10-CM | POA: Diagnosis present

## 2021-02-08 DIAGNOSIS — F32A Depression, unspecified: Secondary | ICD-10-CM | POA: Diagnosis present

## 2021-02-08 DIAGNOSIS — D631 Anemia in chronic kidney disease: Secondary | ICD-10-CM | POA: Diagnosis present

## 2021-02-08 DIAGNOSIS — Z888 Allergy status to other drugs, medicaments and biological substances status: Secondary | ICD-10-CM

## 2021-02-08 DIAGNOSIS — Z9049 Acquired absence of other specified parts of digestive tract: Secondary | ICD-10-CM | POA: Diagnosis not present

## 2021-02-08 DIAGNOSIS — E039 Hypothyroidism, unspecified: Secondary | ICD-10-CM | POA: Diagnosis present

## 2021-02-08 DIAGNOSIS — Z8041 Family history of malignant neoplasm of ovary: Secondary | ICD-10-CM

## 2021-02-08 DIAGNOSIS — Z20822 Contact with and (suspected) exposure to covid-19: Secondary | ICD-10-CM | POA: Diagnosis present

## 2021-02-08 DIAGNOSIS — Z79890 Hormone replacement therapy (postmenopausal): Secondary | ICD-10-CM

## 2021-02-08 DIAGNOSIS — Z8619 Personal history of other infectious and parasitic diseases: Secondary | ICD-10-CM | POA: Diagnosis not present

## 2021-02-08 DIAGNOSIS — G40101 Localization-related (focal) (partial) symptomatic epilepsy and epileptic syndromes with simple partial seizures, not intractable, with status epilepticus: Secondary | ICD-10-CM | POA: Diagnosis present

## 2021-02-08 DIAGNOSIS — N319 Neuromuscular dysfunction of bladder, unspecified: Secondary | ICD-10-CM | POA: Diagnosis present

## 2021-02-08 DIAGNOSIS — Z841 Family history of disorders of kidney and ureter: Secondary | ICD-10-CM

## 2021-02-08 DIAGNOSIS — G43909 Migraine, unspecified, not intractable, without status migrainosus: Secondary | ICD-10-CM | POA: Diagnosis present

## 2021-02-08 DIAGNOSIS — S30851A Superficial foreign body of abdominal wall, initial encounter: Secondary | ICD-10-CM

## 2021-02-08 DIAGNOSIS — G379 Demyelinating disease of central nervous system, unspecified: Secondary | ICD-10-CM | POA: Diagnosis present

## 2021-02-08 DIAGNOSIS — Z833 Family history of diabetes mellitus: Secondary | ICD-10-CM

## 2021-02-08 DIAGNOSIS — Z79899 Other long term (current) drug therapy: Secondary | ICD-10-CM

## 2021-02-08 DIAGNOSIS — Z85820 Personal history of malignant melanoma of skin: Secondary | ICD-10-CM | POA: Diagnosis not present

## 2021-02-08 DIAGNOSIS — Z823 Family history of stroke: Secondary | ICD-10-CM

## 2021-02-08 DIAGNOSIS — Z7902 Long term (current) use of antithrombotics/antiplatelets: Secondary | ICD-10-CM

## 2021-02-08 DIAGNOSIS — Z8249 Family history of ischemic heart disease and other diseases of the circulatory system: Secondary | ICD-10-CM

## 2021-02-08 DIAGNOSIS — F419 Anxiety disorder, unspecified: Secondary | ICD-10-CM | POA: Diagnosis present

## 2021-02-08 DIAGNOSIS — Z8051 Family history of malignant neoplasm of kidney: Secondary | ICD-10-CM

## 2021-02-08 DIAGNOSIS — Z803 Family history of malignant neoplasm of breast: Secondary | ICD-10-CM

## 2021-02-08 LAB — LIPID PANEL
Cholesterol: 158 mg/dL (ref 0–200)
HDL: 40 mg/dL — ABNORMAL LOW (ref >40)
LDL Cholesterol: 95 mg/dL (ref 0–99)
Total CHOL/HDL Ratio: 4 RATIO
Triglycerides: 117 mg/dL (ref <150)
VLDL: 23 mg/dL (ref 0–40)

## 2021-02-08 LAB — HEMOGLOBIN A1C
Hgb A1c MFr Bld: 5.6 % (ref 4.8–5.6)
Mean Plasma Glucose: 114.02 mg/dL

## 2021-02-08 LAB — CBG MONITORING, ED
Glucose-Capillary: 192 mg/dL — ABNORMAL HIGH (ref 70–99)
Glucose-Capillary: 201 mg/dL — ABNORMAL HIGH (ref 70–99)
Glucose-Capillary: 148 mg/dL — ABNORMAL HIGH (ref 70–99)
Glucose-Capillary: 161 mg/dL — ABNORMAL HIGH (ref 70–99)

## 2021-02-08 LAB — GLUCOSE, CAPILLARY: Glucose-Capillary: 133 mg/dL — ABNORMAL HIGH (ref 70–99)

## 2021-02-08 LAB — CREATININE, SERUM
Creatinine, Ser: 1.17 mg/dL — ABNORMAL HIGH (ref 0.44–1.00)
GFR, Estimated: 56 mL/min — ABNORMAL LOW (ref >60)

## 2021-02-08 LAB — LACTIC ACID, PLASMA: Lactic Acid, Venous: 1.8 mmol/L (ref 0.5–1.9)

## 2021-02-08 LAB — PROCALCITONIN: Procalcitonin: 0.14 ng/mL

## 2021-02-08 MED ORDER — INSULIN ASPART 100 UNIT/ML IJ SOLN
0.0000 [IU] | INTRAMUSCULAR | Status: DC
  Administered 2021-02-09 – 2021-02-10 (×4): 1 [IU] via SUBCUTANEOUS
  Administered 2021-02-11: 2 [IU] via SUBCUTANEOUS

## 2021-02-08 MED ORDER — NICARDIPINE HCL IN NACL 20-0.86 MG/200ML-% IV SOLN
3.0000 mg/h | INTRAVENOUS | Status: DC
  Administered 2021-02-08: 5 mg/h via INTRAVENOUS
  Administered 2021-02-08 (×2): 7.5 mg/h via INTRAVENOUS
  Filled 2021-02-08 (×4): qty 200

## 2021-02-08 MED ORDER — SODIUM CHLORIDE 0.9 % IV SOLN: INTRAVENOUS | Status: DC

## 2021-02-08 MED ORDER — LORAZEPAM 2 MG/ML IJ SOLN
INTRAMUSCULAR | Status: AC
  Administered 2021-02-08: 1 mg via INTRAVENOUS
  Filled 2021-02-08: qty 1

## 2021-02-08 MED ORDER — DIPHENHYDRAMINE HCL 50 MG/ML IJ SOLN
25.0000 mg | Freq: Once | INTRAMUSCULAR | Status: AC
  Administered 2021-02-08: 25 mg via INTRAVENOUS
  Filled 2021-02-08: qty 1

## 2021-02-08 MED ORDER — VANCOMYCIN HCL IN DEXTROSE 1-5 GM/200ML-% IV SOLN: 1000.0000 mg | INTRAVENOUS | Status: DC

## 2021-02-08 MED ORDER — HYDRALAZINE HCL 20 MG/ML IJ SOLN
10.0000 mg | Freq: Four times a day (QID) | INTRAMUSCULAR | Status: DC | PRN
  Administered 2021-02-08: 10 mg via INTRAVENOUS
  Filled 2021-02-08: qty 1

## 2021-02-08 MED ORDER — LORAZEPAM 2 MG/ML IJ SOLN: 1.0000 mg | Freq: Once | INTRAMUSCULAR | Status: AC

## 2021-02-08 MED ORDER — LORAZEPAM 2 MG/ML IJ SOLN
INTRAMUSCULAR | Status: AC
  Administered 2021-02-08: 2 mg via INTRAVENOUS
  Filled 2021-02-08: qty 1

## 2021-02-08 MED ORDER — LEVETIRACETAM IN NACL 1000 MG/100ML IV SOLN: 1000.0000 mg | Freq: Two times a day (BID) | INTRAVENOUS | Status: DC

## 2021-02-08 MED ORDER — GLUCOSE 4 G PO CHEW: 1.0000 | CHEWABLE_TABLET | Freq: Three times a day (TID) | ORAL | 12 refills | Status: DC | PRN

## 2021-02-08 MED ORDER — LABETALOL HCL 5 MG/ML IV SOLN
10.0000 mg | INTRAVENOUS | Status: DC | PRN
  Administered 2021-02-09 (×3): 10 mg via INTRAVENOUS
  Filled 2021-02-08 (×3): qty 4

## 2021-02-08 MED ORDER — LEVETIRACETAM IN NACL 1500 MG/100ML IV SOLN
1500.0000 mg | Freq: Once | INTRAVENOUS | Status: AC
  Administered 2021-02-08: 1500 mg via INTRAVENOUS
  Filled 2021-02-08: qty 100

## 2021-02-08 MED ORDER — LACTATED RINGERS IV SOLN: INTRAVENOUS | Status: AC

## 2021-02-08 MED ORDER — LORAZEPAM 2 MG/ML IJ SOLN: 2.0000 mg | Freq: Once | INTRAMUSCULAR | Status: AC

## 2021-02-08 MED ORDER — PROCHLORPERAZINE EDISYLATE 10 MG/2ML IJ SOLN
10.0000 mg | Freq: Once | INTRAMUSCULAR | Status: AC
  Administered 2021-02-08: 10 mg via INTRAVENOUS
  Filled 2021-02-08: qty 2

## 2021-02-08 MED ORDER — STROKE: EARLY STAGES OF RECOVERY BOOK: Freq: Once | Status: DC

## 2021-02-08 MED ORDER — LEVETIRACETAM IN NACL 1000 MG/100ML IV SOLN
1000.0000 mg | Freq: Two times a day (BID) | INTRAVENOUS | Status: DC
  Administered 2021-02-09: 1000 mg via INTRAVENOUS
  Filled 2021-02-08: qty 100

## 2021-02-08 MED ORDER — ENOXAPARIN SODIUM 40 MG/0.4ML IJ SOSY
40.0000 mg | PREFILLED_SYRINGE | Freq: Every day | INTRAMUSCULAR | Status: DC
  Administered 2021-02-08: 40 mg via SUBCUTANEOUS
  Filled 2021-02-08: qty 0.4

## 2021-02-08 MED ORDER — HALOPERIDOL LACTATE 5 MG/ML IJ SOLN
2.0000 mg | Freq: Once | INTRAMUSCULAR | Status: AC
  Administered 2021-02-08: 2 mg via INTRAVENOUS
  Filled 2021-02-08: qty 1

## 2021-02-08 MED ORDER — ACETAMINOPHEN 325 MG PO TABS
650.0000 mg | ORAL_TABLET | Freq: Four times a day (QID) | ORAL | Status: DC | PRN
  Administered 2021-02-10: 650 mg via ORAL
  Filled 2021-02-08: qty 2

## 2021-02-08 MED ORDER — NITROFURANTOIN MACROCRYSTAL 100 MG PO CAPS: ORAL_CAPSULE | ORAL | 11 refills | Status: DC

## 2021-02-08 MED ORDER — ACETAMINOPHEN 160 MG/5ML PO SOLN
650.0000 mg | ORAL | Status: DC | PRN
  Filled 2021-02-08: qty 20.3

## 2021-02-08 MED ORDER — INSULIN ASPART 100 UNIT/ML IJ SOLN
0.0000 [IU] | INTRAMUSCULAR | Status: DC
  Administered 2021-02-08: 5 [IU] via SUBCUTANEOUS
  Administered 2021-02-08: 2 [IU] via SUBCUTANEOUS
  Administered 2021-02-08: 3 [IU] via SUBCUTANEOUS
  Filled 2021-02-08 (×3): qty 1

## 2021-02-08 MED ORDER — ACETAMINOPHEN 325 MG PO TABS: 650.0000 mg | ORAL_TABLET | ORAL | Status: DC | PRN

## 2021-02-08 MED ORDER — SODIUM CHLORIDE 0.9 % IV SOLN
2.0000 g | Freq: Two times a day (BID) | INTRAVENOUS | Status: DC
  Administered 2021-02-08 – 2021-02-09 (×2): 2 g via INTRAVENOUS
  Filled 2021-02-08 (×2): qty 2

## 2021-02-08 MED ORDER — ACETAMINOPHEN 325 MG RE SUPP: 650.0000 mg | RECTAL | Status: DC | PRN

## 2021-02-08 MED ORDER — LABETALOL HCL 5 MG/ML IV SOLN
10.0000 mg | Freq: Once | INTRAVENOUS | Status: AC
  Administered 2021-02-08: 10 mg via INTRAVENOUS

## 2021-02-08 MED ORDER — LEVETIRACETAM IN NACL 1000 MG/100ML IV SOLN
1000.0000 mg | Freq: Once | INTRAVENOUS | Status: DC
Start: 2021-02-08 — End: 2021-02-08

## 2021-02-08 MED ORDER — ONDANSETRON HCL 4 MG/2ML IJ SOLN
4.0000 mg | Freq: Four times a day (QID) | INTRAMUSCULAR | Status: DC | PRN
  Administered 2021-02-08: 4 mg via INTRAVENOUS
  Filled 2021-02-08: qty 2

## 2021-02-08 MED ORDER — ACETAMINOPHEN 650 MG RE SUPP: 650.0000 mg | Freq: Four times a day (QID) | RECTAL | Status: DC | PRN

## 2021-02-08 MED ORDER — HEPARIN SODIUM (PORCINE) 5000 UNIT/ML IJ SOLN
5000.0000 [IU] | Freq: Three times a day (TID) | INTRAMUSCULAR | Status: DC
  Administered 2021-02-09 – 2021-02-11 (×8): 5000 [IU] via SUBCUTANEOUS
  Filled 2021-02-08 (×8): qty 1

## 2021-02-08 MED ORDER — VANCOMYCIN HCL 1250 MG/250ML IV SOLN
1250.0000 mg | Freq: Once | INTRAVENOUS | Status: AC
  Administered 2021-02-09: 1250 mg via INTRAVENOUS
  Filled 2021-02-08: qty 250

## 2021-02-08 NOTE — Consult Note (Signed)
Neurology Consultation Reason for Consult: Altered mental status Referring Physician: Billie Ruddy, T  CC: Altered mental status  History is obtained from: Chart, patient  HPI: [name] is a 51 y.o. female with a history of previous strokes with left-sided weakness with progressive memory problems being evaluated as an outpatient ([facility] 21/30) who presents with worsening altered mental status over the past day.  She was in her normal state of health on Thursday, when her son talked to her on Friday she was confused, but still talkative.  Over the course of the next 24 hours she has progressively gotten worse and stop talking.  They brought her into the emergency department for further evaluation.  Her son reports that she has had similar symptoms in the past with UTIs.  She had a cough a couple of weeks ago, but nothing over the past week.  LKW: 1/5 prior to bed tpa given?: no, outside of window   ROS:  Unable to obtain due to altered mental status.   Past Medical History:  Diagnosis Date   Acute colitis 01/27/2017   Acute pyelonephritis    Acute upper GI bleed 01/25/2020   Anemia    iron deficiency anemia   Aortic arch aneurysm    BRCA negative 2014   CAD (coronary artery disease)    a. 08/2003 Cath: LAD 30-40-med Rx; b. 11/2014 PCI: LAD 4m(3.25x23 Xience Alpine DES); c. 06/2015 PCI: D1 (2.25x12 Resolute Integrity DES); d. 06/2017 PCI: Patent mLAD stent, D2 95 (PTCA); e. 09/2017 PCI: D2 99ost (CBA); d. 12/2017 Cath: LM nl, LAD 331m80d (small), D1 40ost, D2 95ost, LCX 40p, RCA 40ost/p->Med rx for D2 given restenosis.   Closed nondisplaced intertrochanteric fracture of left femur ([facility] [place]/10/2018   Colitis 06/03/2015   Colon polyp    CVA (cerebral vascular accident) ([facility]   Left side weakness.    Degenerative tear of glenoid labrum of right shoulder 03/15/2017   Diabetes mellitus without complication ([facility]   Family history of breast cancer    BRCA neg 2014   Femur fracture, left  ([facility]9/10/2018   Gastric ulcer 3/0/11/9321 Helicobacter pylori infection 11/23/2014   History of echocardiogram    a. 03/2017 Echo: EF 60-65%, no rwma; b. 02/2018 Echo: EF 60-65%, no rwma. Nl RV fxn. No cardiac source of emboli (admitted w/ stroke).   Hypertension    Malignant melanoma of skin of scalp (HCC)    MI, acute, non ST segment elevation (HCC)    Neuromuscular disorder (HCC)    S/P drug eluting coronary stent placement 06/04/2015   Sepsis ([facility] [place]2/14/2019   Sepsis secondary to UTI ([facility]3/22/2022   Stroke ([facility]   a. 02/2018 MRI: 54m61mate acute/early subacute L medial frontal lobe inarct; b. 02/2018 MRA No large vessel occlusion or aneurysm. Mod to sev L P2 stenosis. thready L vertebral artery, diffusely dzs'd; c. 02/2018 Carotid U/S: <50% bilat ICA dzs.     Family History  Problem Relation Age of Onset   Hypertension Mother    Anxiety disorder Mother    Depression Mother    Bipolar disorder Mother    Heart disease Mother        No details   Hyperlipidemia Mother    Kidney disease Father    Heart disease Father 40 72Hypertension Father    Diabetes Father    Stroke Father    Colon cancer Father        dx in  his 62's   Anxiety disorder Father    Depression Father    Skin cancer Father    Kidney disease Sister    Thyroid nodules Sister    Hypertension Sister    Hypertension Sister    Diabetes Sister    Hyperlipidemia Sister    Depression Sister    Breast cancer Maternal Aunt 68   Breast cancer Maternal Aunt 48   Ovarian cancer Cousin    Colon cancer Cousin    Kidney cancer Cousin    Breast cancer Other    Bladder Cancer Neg Hx      Social History:  reports that she quit smoking about 26 years ago. Her smoking use included cigarettes. She has a 0.25 pack-year smoking history. She has never used smokeless tobacco. She reports that she does not drink alcohol and does not use drugs.   Exam: Current vital signs: BP (!) 179/98    Pulse (!) 111    Temp 97.6 F (36.4  C) (Axillary)    Resp 14    Ht '5\' 3"'  (1.6 m)    Wt 71.7 kg    SpO2 99%    BMI 27.99 kg/m  Vital signs in last 24 hours: Temp:  [97.6 F (36.4 C)-98.1 F (36.7 C)] 97.6 F (36.4 C) (01/07 0830) Pulse Rate:  [55-131] 111 (01/07 1100) Resp:  [11-23] 14 (01/07 1100) BP: (121-246)/(71-144) 179/98 (01/07 1100) SpO2:  [95 %-100 %] 99 % (01/07 1100)   Physical Exam  Constitutional: Appears well-developed and well-nourished.  Psych: Affect appropriate to situation Eyes: No scleral injection HENT: No OP obstruction MSK: no joint deformities.  Cardiovascular: Normal rate and regular rhythm.  Respiratory: Effort normal, non-labored breathing GI: Soft.  No distension. There is no tenderness.  Skin: WDI  Neuro: Mental Status: Patient is awake, alert, she is able to answer some questions with nods/shakes but does not speak.  She does not tell me her name or answer any other questions verbally. Cranial Nerves: II: She blinks to threat bilaterally. Pupils are equal, round, and reactive to light.   III,IV, VI: EOMI without ptosis or diploplia.  V: Facial sensation is symmetric to temperature VII: Facial movement is symmetric.  VIII: hearing is intact to voice X: Uvula elevates symmetrically XI: Shoulder shrug is symmetric. XII: tongue is midline without atrophy or fasciculations.  Motor: She moves extremities relatively symmetrically, no drift. Sensory: She endorses symmetric sensation Cerebellar: No clear ataxia   I have reviewed labs in epic and the results pertinent to this consultation are: UA - 21 - 50 WBC Covid - negative LDL 95  I have reviewed the images obtained:CTA head and neck - right apical ground glass.   Impression: 51 yo F with altered mental status in the setting of previous cognitive disorder.  Given a history of similar presentation with infections, I think that the elevated WBC and her urine may be meaningful.  She is also severely hypertensive, and hypertensive  encephalopathy would also be a consideration.  Given her history of stroke, ischemic infarct with subsequent aphasia would also be a consideration.  I would also favor getting an EEG to rule out seizure activity.  Recommendations: 1) MRI brain without contrast 2) EEG 3) continue home aspirin and Plavix 4) treatment of UTI per internal medicine 5) neurology will continue to follow  [name], MD [facility] [phone]  If 7pm- 7am, please page neurology on call as listed in [place].

## 2021-02-08 NOTE — ED Notes (Signed)
Pt tachycardiac and agitated at this time, MD aware, new orders, see MAR.

## 2021-02-08 NOTE — ED Notes (Signed)
Pt confused and uncooperative to follow commands for [facility]

## 2021-02-08 NOTE — Procedures (Signed)
History: 51 yo F with altered mental status  Sedation: Ativan given during study  Technique: This 70 minute video EEG was acquired with electrodes placed according to the International 10-20 electrode system (including Fp1, Fp2, F3, F4, C3, C4, P3, P4, O1, O2, T3, T4, T5, T6, A1, A2, Fz, Cz, Pz). The following electrodes were missing or displaced: none.   Background: The background consists of generalized 5 -6 Hz theta activity with superimposed bifrontally predominant sharp wave discharges varying between 2 - 3 Hz, occasionally interspersed with a run of 5-6 hz activity. Following ativan administration, this activity markedly improves, though throughout the study there continues to be occasional bifrontal sharp wave discharges. The EEG improvement was accompanied by clinical improvement with the patient beginning to speak  Photic stimulation: Physiologic driving is not performed  EEG Abnormalities: 1) Frontal status epilepticus   Clinical Interpretation: This EEG recorded evidence of focal frontal status epilepticus  with resolution following ativan administration.   [name], MD [facility] [phone]  If 7pm- 7am, please page neurology on call as listed in [place].

## 2021-02-08 NOTE — Progress Notes (Signed)
Pharmacy Antibiotic Note  [name] is a 51 y.o. female admitted on 02/08/2021 with UTI.  Pharmacy has been consulted for cefepime and vancomycin dosing.   WBC and SCr mildly elevated. Of note, patient has grown GNRs and enterococcus in her urine in the past.  Plan: -Cefepime 2 gm IV Q 12 hours -Vancomycin 1250 mg IV load followed by Vancomycin 1000 mg IV Q 24 hrs. Goal AUC 400-550. Expected AUC: 474 SCr used: 1.17 -Monitor CBC, renal fx, cultures and clinical progress -Vanc levels as indicated    Weight: 68.2 kg (150 lb 5.7 oz)  Temp (24hrs), Avg:98.4 F (36.9 C), Min:97.6 F (36.4 C), Max:99.2 F (37.3 C)  Recent Labs  Lab 02/07/21 1227 02/08/21 0702  WBC 12.6*  --   CREATININE 1.32* 1.17*  LATICACIDVEN 1.6 1.8    Estimated Creatinine Clearance: 52.1 mL/min (A) (by C-G formula based on SCr of 1.17 mg/dL (H)).    Allergies  Allergen Reactions   Lipitor [Atorvastatin] Other (See Comments)    Leg pains    Antimicrobials this admission: Cefepime 1/7 >>  Vancomycin 1/7 >>   Dose adjustments this admission:   Microbiology results: 1/7 BCx:    Thank you for allowing pharmacy to be a part of this patients care.  Albertina Parr, PharmD., BCPS, BCCCP Clinical Pharmacist Please refer to [facility] for unit-specific pharmacist

## 2021-02-08 NOTE — ED Notes (Signed)
Neurology at bedside.

## 2021-02-08 NOTE — ED Notes (Signed)
Pt pulled out IV, multiple IV attempts made by multiple staff with no success

## 2021-02-08 NOTE — Progress Notes (Addendum)
SLP Cancellation Note  Patient Details Name: [name] MRN: [id number] DOB: 02/05/1970   Cancelled treatment:       Reason Eval/Treat Not Completed: Medical issues which prohibited therapy;Patient not medically ready;Patient's level of consciousness (chart reviewed) Per chart notes this morning, pt has been confused, uncooperative, and agitated. Due to this, and pt's BASELINE Dx of: "Mild to Moderate Cognitive Impairment in patient with history of multiple recurrent strokes + severe white matter microvascular ischemic and metabolic changes - concern for underlying Vascular Dementia" and on [place] then (per her Neurologist 11/2020), will hold on performing a Cognitive-linguistic assessment at this time until pt is less confused/agitated. Recommend reducing distractions during engagements and conversation.  ST services will f/u tomorrow to assess pt's appropriateness for evaluation.    [name], MS, CCC-SLP Speech Language Pathologist Rehab Services [phone] [facility] 02/08/2021, 12:00 PM

## 2021-02-08 NOTE — ED Notes (Signed)
Hospitalist at bedside 

## 2021-02-08 NOTE — ED Notes (Signed)
EEG in room 

## 2021-02-08 NOTE — Progress Notes (Signed)
OT Cancellation Note  Patient Details Name: [name] MRN: [id number] DOB: 20-Feb-1970   Cancelled Treatment:    Reason Eval/Treat Not Completed: Other (comment);Patient not medically ready (RN reports pt is non cooperative. Not approrpiate for OT at this time. Will re attempt as able.Shanon Payor, OTD OTR/L  02/08/21, 12:47 PM

## 2021-02-08 NOTE — Progress Notes (Signed)
Prolonged eeg done 

## 2021-02-08 NOTE — Progress Notes (Signed)
EEG revealed status epilepticus.  She markedly improved following 2 mg Ativan.  I will load with Keppra, continue at 1 g twice daily.  [name], MD [facility] [phone]  If 7pm- 7am, please page neurology on call as listed in [place].

## 2021-02-08 NOTE — Progress Notes (Signed)
Cross Cover Patient started on nicardipine infusion in hopes to achieve better blood pressure control.  Minimally responded to hydralazine doses IV fluid clarification as she has both LR at 150 and NS at 100 cc/h ordered. Changed to 50 cc/h of NS

## 2021-02-08 NOTE — ED Notes (Signed)
Lab called to run urine culture after UA specimen

## 2021-02-08 NOTE — ED Notes (Signed)
Son at bedside.

## 2021-02-08 NOTE — H&P (Signed)
History and Physical    [name] WFU:[id number] DOB: [birth year] DOA: 02/08/2021  PCP: [name], MD  Patient coming from: Patient was transferred from [facility].  Chief Complaint: Encephalopathy concerning for status epilepticus.  HPI: [name] is a 51 y.o. female with history of stroke with left-sided hemiplegia, demyelinating disease followed by neurology, hypothyroidism, chronic kidney disease stage III, diabetes mellitus type 2, hypertension, migraine was brought to the ER at [facility] on February 08, 2020 2 days ago for acute confusional status with decreased speech.   ED Course: In the ER patient was found to have leukocytosis with chest x-ray concerning for infiltrates CT angiogram head and neck did not show any large vessel obstruction.  Neurologist on-call was consulted and patient had EEG which was showing possibility of partial status epilepticus and patient was placed on Keppra as needed Ativan and transferred to [facility] for continuous EEG monitoring.  Patient UA is concerning for UTI and has had previous history of ESBL infection.  CT angiogram of the head and neck did show some opacities in the lung concerning for pneumonia but as per the transferring physician patient not have any's signs and symptoms of pneumonia.  Patient is also noted to have markedly elevated blood pressure with systolic blood pressure in more than 240s.  Was started on Cardene.  Review of Systems: As per HPI, rest all negative.   Past Medical History:  Diagnosis Date   Acute colitis 01/27/2017   Acute pyelonephritis    Acute upper GI bleed 01/25/2020   Anemia    iron deficiency anemia   Aortic arch aneurysm    BRCA negative 2014   CAD (coronary artery disease)    a. 08/2003 Cath: LAD 30-40-med Rx; b. 11/2014 PCI: LAD 29m(3.25x23 Xience Alpine DES); c. 06/2015 PCI: D1 (2.25x12 Resolute Integrity DES); d. 06/2017 PCI: Patent mLAD  stent, D2 95 (PTCA); e. 09/2017 PCI: D2 99ost (CBA); d. 12/2017 Cath: LM nl, LAD 39m80d (small), D1 40ost, D2 95ost, LCX 40p, RCA 40ost/p->Med rx for D2 given restenosis.   Closed nondisplaced intertrochanteric fracture of left femur ([facility]9/10/2018   Colitis 06/03/2015   Colon polyp    CVA (cerebral vascular accident) ([facility]   Left side weakness.    Degenerative tear of glenoid labrum of right shoulder 03/15/2017   Diabetes mellitus without complication ([facility]   Family history of breast cancer    BRCA neg 2014   Femur fracture, left (HCHolliday9/10/2018   Gastric ulcer 04/03/00/5427 Helicobacter pylori infection 11/23/2014   History of echocardiogram    a. 03/2017 Echo: EF 60-65%, no rwma; b. 02/2018 Echo: EF 60-65%, no rwma. Nl RV fxn. No cardiac source of emboli (admitted w/ stroke).   Hypertension    Malignant melanoma of skin of scalp (HCC)    MI, acute, non ST segment elevation (HCC)    Neuromuscular disorder (HCC)    S/P drug eluting coronary stent placement 06/04/2015   Sepsis (HCG[facility]2/14/2019   Sepsis secondary to UTI (HCR[place]3/22/2022   Stroke ([facility]   a. 02/2018 MRI: 73m50mate acute/early subacute L medial frontal lobe inarct; b. 02/2018 MRA No large vessel occlusion or aneurysm. Mod to sev L P2 stenosis. thready L vertebral artery, diffusely dzs'd; c. 02/2018 Carotid U/S: <50% bilat ICA dzs.    Past Surgical History:  Procedure Laterality Date   APPENDECTOMY     BALLOON ENTEROSCOPY  02/06/2020   [facility]   BIOPSY N/A 03/14/2020   Procedure: BIOPSY;  Surgeon: [name], MD;  Location: [place];  Service: Endoscopy;  Laterality: N/A;   CARDIAC CATHETERIZATION N/A 11/09/2014   Procedure: Coronary Angiography;  Surgeon: Minna Merritts, MD;  Location: [place] CV LAB;  Service: Cardiovascular;  Laterality: N/A;   CARDIAC CATHETERIZATION N/A 11/12/2014   Procedure: Coronary Stent Intervention;  Surgeon: Isaias Cowman, MD;  Location: [place] [facility];  Service:  Cardiovascular;  Laterality: N/A;   CARDIAC CATHETERIZATION N/A 04/18/2015   Procedure: Left Heart Cath and Coronary Angiography;  Surgeon: Minna Merritts, MD;  Location: [place] CV LAB;  Service: Cardiovascular;  Laterality: N/A;   CARDIAC CATHETERIZATION Left 06/04/2015   Procedure: Left Heart Cath and Coronary Angiography;  Surgeon: Wellington Hampshire, MD;  Location: [place] CV LAB;  Service: Cardiovascular;  Laterality: Left;   CARDIAC CATHETERIZATION N/A 06/04/2015   Procedure: Coronary Stent Intervention;  Surgeon: Wellington Hampshire, MD;  Location: [place] CV LAB;  Service: Cardiovascular;  Laterality: N/A;   CESAREAN SECTION  2001   CHOLECYSTECTOMY N/A 11/18/2016   Procedure: LAPAROSCOPIC CHOLECYSTECTOMY WITH INTRAOPERATIVE CHOLANGIOGRAM;  Surgeon: Christene Lye, MD;  Location: [facility];  Service: General;  Laterality: N/A;   COLONOSCOPY WITH PROPOFOL N/A 04/27/2016   Procedure: COLONOSCOPY WITH PROPOFOL;  Surgeon: [name], MD;  Location: [place];  Service: Endoscopy;  Laterality: N/A;   COLONOSCOPY WITH PROPOFOL N/A 01/12/2018   Procedure: COLONOSCOPY WITH PROPOFOL;  Surgeon: Toledo, Benay Pike, MD;  Location: [facility];  Service: Endoscopy;  Laterality: N/A;   COLONOSCOPY WITH PROPOFOL N/A 03/14/2020   Procedure: COLONOSCOPY WITH PROPOFOL;  Surgeon: [name], MD;  Location: [place];  Service: Endoscopy;  Laterality: N/A;  Needs to be scheduled after 7:30 due to driver issues   CORONARY ANGIOPLASTY     CORONARY BALLOON ANGIOPLASTY N/A 06/29/2017   Procedure: CORONARY BALLOON ANGIOPLASTY;  Surgeon: Wellington Hampshire, MD;  Location: [place] CV LAB;  Service: Cardiovascular;  Laterality: N/A;   CORONARY BALLOON ANGIOPLASTY N/A 09/20/2017   Procedure: CORONARY BALLOON ANGIOPLASTY;  Surgeon: Wellington Hampshire, MD;  Location: [place] CV LAB;  Service: Cardiovascular;  Laterality: N/A;   CORONARY STENT INTERVENTION N/A 12/13/2019    Procedure: CORONARY STENT INTERVENTION;  Surgeon: Wellington Hampshire, MD;  Location: [place] CV LAB;  Service: Cardiovascular;  Laterality: N/A;   DILATION AND CURETTAGE OF UTERUS     ESOPHAGOGASTRODUODENOSCOPY (EGD) WITH PROPOFOL N/A 09/14/2014   Procedure: ESOPHAGOGASTRODUODENOSCOPY (EGD) WITH PROPOFOL;  Surgeon: Josefine Class, MD;  Location[facility];  Service: Endoscopy;  Laterality: N/A;   ESOPHAGOGASTRODUODENOSCOPY (EGD) WITH PROPOFOL N/A 04/27/2016   Procedure: ESOPHAGOGASTRODUODENOSCOPY (EGD) WITH PROPOFOL;  Surgeon: [name], MD;  Location: [place];  Service: Endoscopy;  Laterality: N/A;  Diabetic - oral meds   ESOPHAGOGASTRODUODENOSCOPY (EGD) WITH PROPOFOL N/A 01/12/2018   Procedure: ESOPHAGOGASTRODUODENOSCOPY (EGD) WITH PROPOFOL;  Surgeon: Toledo, Benay Pike, MD;  Location: [facility];  Service: Endoscopy;  Laterality: N/A;   ESOPHAGOGASTRODUODENOSCOPY (EGD) WITH PROPOFOL N/A 04/11/2019   Procedure: ESOPHAGOGASTRODUODENOSCOPY (EGD) WITH PROPOFOL;  Surgeon: [name], MD;  Location: [facility];  Service: Endoscopy;  Laterality: N/A;   ESOPHAGOGASTRODUODENOSCOPY (EGD) WITH PROPOFOL N/A 03/08/2020   Procedure: ESOPHAGOGASTRODUODENOSCOPY (EGD) WITH PROPOFOL;  Surgeon: [name], MD;  Location: [facility] ENDOSCOPY;  Service: Endoscopy;  Laterality: N/A;   GASTRIC BYPASS  09/2009   [place] (  IM) NAIL INTERTROCHANTERIC Left 10/13/2018   Procedure: INTRAMEDULLARY (IM) NAIL INTERTROCHANTRIC;  Surgeon: Leandrew Koyanagi, MD;  Location: [place];  Service: Orthopedics;  Laterality: Left;   Left Carotid to sublcavian artery bypass w/ subclavian artery ligation     a. Performed @ [facility].   LEFT HEART CATH AND CORONARY ANGIOGRAPHY Left 06/29/2017   Procedure: LEFT HEART CATH AND CORONARY ANGIOGRAPHY;  Surgeon: Wellington Hampshire, MD;  Location: [place] [facility] LAB;  Service: Cardiovascular;  Laterality: Left;   LEFT HEART CATH AND  CORONARY ANGIOGRAPHY N/A 09/20/2017   Procedure: LEFT HEART CATH AND CORONARY ANGIOGRAPHY;  Surgeon: Wellington Hampshire, MD;  Location: [place] [facility] LAB;  Service: Cardiovascular;  Laterality: N/A;   LEFT HEART CATH AND CORONARY ANGIOGRAPHY N/A 12/20/2017   Procedure: LEFT HEART CATH AND CORONARY ANGIOGRAPHY;  Surgeon: Wellington Hampshire, MD;  Location: [place] [facility] LAB;  Service: Cardiovascular;  Laterality: N/A;   LEFT HEART CATH AND CORONARY ANGIOGRAPHY N/A 04/20/2019   Procedure: LEFT HEART CATH AND CORONARY ANGIOGRAPHY possible PCI;  Surgeon: Minna Merritts, MD;  Location: [place] [facility];  Service: Cardiovascular;  Laterality: N/A;   LEFT HEART CATH AND CORONARY ANGIOGRAPHY N/A 12/13/2019   Procedure: LEFT HEART CATH AND CORONARY ANGIOGRAPHY;  Surgeon: Wellington Hampshire, MD;  Location: [place] [facility];  Service: Cardiovascular;  Laterality: N/A;   MELANOMA EXCISION  2016   Dr. Henreitta Cea ABLATION  2002   RIGHT OOPHORECTOMY     SHOULDER ARTHROSCOPY WITH OPEN ROTATOR CUFF REPAIR Right 01/07/2016   Procedure: SHOULDER ARTHROSCOPY WITH DEBRIDMENT, SUBACHROMIAL DECOMPRESSION;  Surgeon: Corky Mull, MD;  Location: [facility];  Service: Orthopedics;  Laterality: Right;   SHOULDER ARTHROSCOPY WITH OPEN ROTATOR CUFF REPAIR Right 03/16/2017   Procedure: SHOULDER ARTHROSCOPY WITH OPEN ROTATOR CUFF REPAIR POSSIBLE BICEPS TENODESIS;  Surgeon: Corky Mull, MD;  Location: [facility];  Service: Orthopedics;  Laterality: Right;   TRIGGER FINGER RELEASE Right     [place]     reports that she quit smoking about 26 years ago. Her smoking use included cigarettes. She has a 0.25 pack-year smoking history. She has never used smokeless tobacco. She reports that she does not drink alcohol and does not use drugs.  Allergies  Allergen Reactions   Lipitor [Atorvastatin] Other (See Comments)    Leg pains    Family History  Problem Relation Age of Onset   Hypertension Mother     Anxiety disorder Mother    Depression Mother    Bipolar disorder Mother    Heart disease Mother        No details   Hyperlipidemia Mother    Kidney disease Father    Heart disease Father 79   Hypertension Father    Diabetes Father    Stroke Father    Colon cancer Father        dx in his 1's   Anxiety disorder Father    Depression Father    Skin cancer Father    Kidney disease Sister    Thyroid nodules Sister    Hypertension Sister    Hypertension Sister    Diabetes Sister    Hyperlipidemia Sister    Depression Sister    Breast cancer Maternal Aunt 23   Breast cancer Maternal Aunt 62   Ovarian cancer Cousin    Colon cancer Cousin    Kidney cancer Cousin    Breast cancer Other    Bladder Cancer Neg  Hx     Prior to Admission medications   Medication Sig Start Date End Date Taking? Authorizing Provider  AIMOVIG 140 MG/ML SOAJ Inject into the skin. 10/14/20   [provider]  ALPRAZolam Duanne Moron) 1 MG tablet Take 1 tablet by mouth twice daily 11/26/20   [name], MD  aspirin EC 81 MG EC tablet Take 1 tablet (81 mg total) by mouth daily. Swallow whole. 12/10/20 02/08/21  Elodia Florence., MD  Baclofen 5 MG TABS Take 1 tablet by mouth 2 (two) times daily as needed. 11/26/20   [provider]  benzonatate (TESSALON) 100 MG capsule Take 1 capsule (100 mg total) by mouth 3 (three) times daily as needed for cough. 01/23/21   Brunetta Jeans, PA-C  buPROPion [facility] SR) 100 MG 12 hr tablet Take 100 mg by mouth 2 (two) times daily. 05/03/20   [provider]  carvedilol (COREG) 12.5 MG tablet Take 1 tablet by mouth twice daily 08/21/20   Minna Merritts, MD  clopidogrel (PLAVIX) 75 MG tablet Take 1 tablet (75 mg total) by mouth daily. 11/20/20   Furth, Cadence H, PA-C  desvenlafaxine (PRISTIQ) 50 MG 24 hr tablet Take 50 mg by mouth daily. 11/26/20   [provider]  diclofenac Sodium (VOLTAREN) 1 % GEL Apply 2 g topically 4 (four) times  daily.    [provider]  diphenoxylate-atropine (LOMOTIL) 2.5-0.025 MG tablet Take 1 tablet by mouth 4 (four) times daily as needed for diarrhea or loose stools. 01/07/21   [name], MD  estradiol (ESTRACE) 0.1 MG/GM vaginal cream Estrogen Cream Instruction Discard applicator Apply pea sized amount to tip of finger to urethra before bed. Wash hands well after application. Use Monday, Wednesday and Friday 05/08/20   Billey Co, MD  furosemide (LASIX) 20 MG tablet Take 1 tablet by mouth every day as needed for swelling 12/13/20   Furth, Cadence H, PA-C  gabapentin (NEURONTIN) 300 MG capsule Take 2 capsules (600 mg total) by mouth 2 (two) times daily. 07/24/20   [name], MD  glucose 4 GM chewable tablet Chew 1 tablet (4 g total) by mouth 3 (three) times daily as needed for low blood sugar. Home med. 02/08/21   Enzo Bi, MD  isosorbide mononitrate (IMDUR) 120 MG 24 hr tablet Take 1 tablet by mouth every day 01/28/21   Minna Merritts, MD  levETIRAcetam (KEPPRA) 1000 MG/100ML SOLN Inject 100 mLs (1,000 mg total) into the vein every 12 (twelve) hours. 02/08/21   Enzo Bi, MD  levothyroxine (SYNTHROID) 25 MCG tablet Take 1 tablet by mouth 30 minutes before breakfast 11/26/20   [name], MD  nitrofurantoin (MACRODANTIN) 100 MG capsule Hold while on IV antibiotic. 02/08/21   Enzo Bi, MD  nitroGLYCERIN (NITROSTAT) 0.4 MG SL tablet Dissolve 1 tablet under tongue every 53mns, up to 3 doses, for chest pain. Max 3 doses in 314ms. 12/30/20   GoMinna MerrittsMD  pantoprazole (PROTONIX) 40 MG tablet Take 1 tablet (40 mg total) by mouth 2 (two) times daily. 12/20/20 02/08/21  FiBirdie SonsMD  Polyethyl Glycol-Propyl Glycol (SYSTANE OP) Place 1 drop into both eyes daily as needed (dry eyes).    [provider]  promethazine (PHENERGAN) 25 MG tablet Take 1 tablet (25 mg total) by mouth every 6 (six) hours as needed for nausea or vomiting. 07/02/20   FiBirdie SonsMD   Rimegepant Sulfate (NURTEC) 75 MG TBDP Take 75 mg  by mouth as needed. 01/17/21   Marcial Pacas, MD  rosuvastatin (CRESTOR) 40 MG tablet Take 1 tablet (40 mg total) by mouth daily at 6 PM. Patient not taking: Reported on 02/08/2021 10/25/19   Minna Merritts, MD  traMADol (ULTRAM) 50 MG tablet Take 1 tablet (50 mg total) by mouth every 6 (six) hours as needed. 12/24/20   [name], MD  traZODone (DESYREL) 100 MG tablet Take 1 to 2 tablets by mouth at bedtime 08/21/20   [name], MD    Physical Exam: Constitutional: Moderately built and nourished. Vitals:   02/08/21 2131  BP: 132/86  Temp: 99.2 F (37.3 C)  TempSrc: Axillary  Weight: 68.2 kg   Eyes: Anicteric no pallor. ENMT: No discharge from the ears eyes nose and mouth. Neck: No mass felt.  No neck rigidity. Respiratory: No rhonchi or crepitations. Cardiovascular: S1-S2 heard. Abdomen: Soft nontender bowel sound present. Musculoskeletal: No edema. Skin: No rash. Neurologic: Patient is encephalopathic and at times follows commands but appears lethargic.  Pupils are reacting to light.  Moving all extremities. Psychiatric: Appears confused.   Labs on Admission: I have personally reviewed following labs and imaging studies  CBC: Recent Labs  Lab 02/07/21 1227  WBC 12.6*  HGB 15.4*  HCT 49.1*  MCV 82.2  PLT 465   Basic Metabolic Panel: Recent Labs  Lab 02/07/21 1227 02/08/21 0702  NA 141  --   K 4.5  --   CL 107  --   CO2 25  --   GLUCOSE 160*  --   BUN 16  --   CREATININE 1.32* 1.17*  CALCIUM 9.6  --    GFR: Estimated Creatinine Clearance: 52.1 mL/min (A) (by C-G formula based on SCr of 1.17 mg/dL (H)). Liver Function Tests: Recent Labs  Lab 02/07/21 1227  AST 55*  ALT 74*  ALKPHOS 172*  BILITOT 0.7  PROT 7.6  ALBUMIN 4.1   No results for input(s): LIPASE, AMYLASE in the last 168 hours. No results for input(s): AMMONIA in the last 168 hours. Coagulation Profile: No results for input(s):  INR, PROTIME in the last 168 hours. Cardiac Enzymes: No results for input(s): CKTOTAL, CKMB, CKMBINDEX, TROPONINI in the last 168 hours. BNP (last 3 results) No results for input(s): PROBNP in the last 8760 hours. HbA1C: Recent Labs    02/08/21 0702  HGBA1C 5.6   CBG: Recent Labs  Lab 02/08/21 0456 02/08/21 0745 02/08/21 1120 02/08/21 1629 02/08/21 2145  GLUCAP 192* 201* 148* 161* 133*   Lipid Profile: Recent Labs    02/08/21 0702  CHOL 158  HDL 40*  LDLCALC 95  TRIG 117  CHOLHDL 4.0   Thyroid Function Tests: No results for input(s): TSH, T4TOTAL, FREET4, T3FREE, THYROIDAB in the last 72 hours. Anemia Panel: No results for input(s): VITAMINB12, FOLATE, FERRITIN, TIBC, IRON, RETICCTPCT in the last 72 hours. Urine analysis:    Component Value Date/Time   COLORURINE YELLOW (A) 02/07/2021 2229   APPEARANCEUR HAZY (A) 02/07/2021 2229   APPEARANCEUR Clear 12/19/2020 1411   LABSPEC 1.017 02/07/2021 2229   LABSPEC 1.012 12/29/2013 1210   PHURINE 5.0 02/07/2021 2229   GLUCOSEU NEGATIVE 02/07/2021 2229   GLUCOSEU >=500 12/29/2013 1210   HGBUR MODERATE (A) 02/07/2021 2229   HGBUR negative 04/14/2010 1435   BILIRUBINUR NEGATIVE 02/07/2021 2229   BILIRUBINUR Negative 12/19/2020 1411   BILIRUBINUR Negative 12/29/2013 1210   KETONESUR NEGATIVE 02/07/2021 2229   PROTEINUR 100 (A) 02/07/2021 2229   UROBILINOGEN  0.2 04/18/2020 1326   UROBILINOGEN negative 04/14/2010 1435   NITRITE NEGATIVE 02/07/2021 2229   LEUKOCYTESUR SMALL (A) 02/07/2021 2229   LEUKOCYTESUR Negative 12/29/2013 1210   Sepsis Labs: '@LABRCNTIP' (procalcitonin:4,lacticidven:4) ) Recent Results (from the past 240 hour(s))  Resp Panel by RT-PCR (Flu A&B, Covid) Nasopharyngeal Swab     Status: None   Collection Time: 02/07/21 10:29 PM   Specimen: Nasopharyngeal Swab; Nasopharyngeal(NP) swabs in vial transport medium  Result Value Ref Range Status   SARS Coronavirus 2 by RT PCR NEGATIVE NEGATIVE Final     Comment: (NOTE) SARS-CoV-2 target nucleic acids are NOT DETECTED.  The SARS-CoV-2 RNA is generally detectable in upper respiratory specimens during the acute phase of infection. The lowest concentration of SARS-CoV-2 viral copies this assay can detect is 138 copies/mL. A negative result does not preclude SARS-Cov-2 infection and should not be used as the sole basis for treatment or other patient management decisions. A negative result may occur with  improper specimen collection/handling, submission of specimen other than nasopharyngeal swab, presence of viral mutation(s) within the areas targeted by this assay, and inadequate number of viral copies(<138 copies/mL). A negative result must be combined with clinical observations, patient history, and epidemiological information. The expected result is Negative.  Fact Sheet for Patients:  EntrepreneurPulse.com.au  Fact Sheet for Healthcare Providers:  IncredibleEmployment.be  This test is no t yet approved or cleared by the [place] FDA and  has been authorized for detection and/or diagnosis of SARS-CoV-2 by FDA under an Emergency Use Authorization (EUA). This EUA will remain  in effect (meaning this test can be used) for the duration of the COVID-19 declaration under Section 564(b)(1) of the Act, 21 U.S.C.section 360bbb-3(b)(1), unless the authorization is terminated  or revoked sooner.       Influenza A by PCR NEGATIVE NEGATIVE Final   Influenza B by PCR NEGATIVE NEGATIVE Final    Comment: (NOTE) The Xpert Xpress SARS-CoV-2/FLU/RSV plus assay is intended as an aid in the diagnosis of influenza from Nasopharyngeal swab specimens and should not be used as a sole basis for treatment. Nasal washings and aspirates are unacceptable for Xpert Xpress SARS-CoV-2/FLU/RSV testing.  Fact Sheet for Patients: EntrepreneurPulse.com.au  Fact Sheet for Healthcare  Providers: IncredibleEmployment.be  This test is not yet approved or cleared by the [place] FDA and has been authorized for detection and/or diagnosis of SARS-CoV-2 by FDA under an Emergency Use Authorization (EUA). This EUA will remain in effect (meaning this test can be used) for the duration of the COVID-19 declaration under Section 564(b)(1) of the Act, 21 U.S.C. section 360bbb-3(b)(1), unless the authorization is terminated or revoked.  Performed at [facility], [address] [place], [place] [address]   Blood culture (routine x 2)     Status: None (Preliminary result)   Collection Time: 02/07/21 10:29 PM   Specimen: BLOOD  Result Value Ref Range Status   Specimen Description BLOOD RIGHT ANTECUBITAL  Final   Special Requests   Final    BOTTLES DRAWN AEROBIC AND ANAEROBIC Blood Culture results may not be optimal due to an inadequate volume of blood received in culture bottles   Culture   Final    NO GROWTH < 12 HOURS Performed at [facility], [address] [place], [place] [address]    Report Status PENDING  Incomplete     Radiological Exams on Admission: CT ANGIO HEAD NECK W WO CM  Result Date: 02/07/2021 CLINICAL DATA:  Encephalopathy EXAM: CT ANGIOGRAPHY HEAD  AND NECK TECHNIQUE: Multidetector CT imaging of the head and neck was performed using the standard protocol during bolus administration of intravenous contrast. Multiplanar CT image reconstructions and MIPs were obtained to evaluate the vascular anatomy. Carotid stenosis measurements (when applicable) are obtained utilizing NASCET criteria, using the distal internal carotid diameter as the denominator. CONTRAST:  71m OMNIPAQUE IOHEXOL 350 MG/ML SOLN COMPARISON:  12/05/2020 FINDINGS: CT HEAD FINDINGS Brain: There is no mass, hemorrhage or extra-axial collection. The size and configuration of the ventricles and extra-axial CSF spaces are normal. There is an old right basal  ganglia small vessel infarct. There is hypoattenuation of the periventricular white matter, most commonly indicating chronic ischemic microangiopathy. Skull: The visualized skull base, calvarium and extracranial soft tissues are normal. Sinuses/Orbits: No fluid levels or advanced mucosal thickening of the visualized paranasal sinuses. No mastoid or middle ear effusion. The orbits are normal. CTA NECK FINDINGS SKELETON: There is no bony spinal canal stenosis. No lytic or blastic lesion. OTHER NECK: Normal pharynx, larynx and major salivary glands. No cervical lymphadenopathy. Unremarkable thyroid gland. UPPER CHEST: Multifocal ground-glass opacity in the right lung apex. AORTIC ARCH: There is calcific atherosclerosis of the aortic arch. There is no aneurysm, dissection or hemodynamically significant stenosis of the visualized portion of the aorta. There is a right-sided aortic arch with aberrant left subclavian artery, which is surgically occluded. Patent graft between the left common carotid and left subclavian arteries. RIGHT CAROTID SYSTEM: No dissection, occlusion or aneurysm. Mild atherosclerotic calcification at the carotid bifurcation without hemodynamically significant stenosis. LEFT CAROTID SYSTEM: Early bifurcation. No stenosis or other abnormality. VERTEBRAL ARTERIES: Right dominant configuration. Both origins are clearly patent. There is no dissection, occlusion or flow-limiting stenosis to the skull base (V1-V3 segments). CTA HEAD FINDINGS POSTERIOR CIRCULATION: --Vertebral arteries: Bilateral atherosclerotic calcification. --Inferior cerebellar arteries: Normal. --Basilar artery: Normal. --Superior cerebellar arteries: Normal. --Posterior cerebral arteries (PCA): Severe stenosis of the distal P2 segment of the left PCA, which remains patent distally. The right PCA is predominantly supplied by the posterior communicating artery. ANTERIOR CIRCULATION: --Intracranial internal carotid arteries:  Atherosclerotic calcification of the internal carotid arteries at the skull base without hemodynamically significant stenosis. --Anterior cerebral arteries (ACA): Normal. Absent left A1 segment, normal variant --Middle cerebral arteries (MCA): Mild narrowing of the distal left M1 segment. Normal right MCA. VENOUS SINUSES: As permitted by contrast timing, patent. Review of the MIP images confirms the above findings. IMPRESSION: 1. No emergent large vessel occlusion. 2. Unchanged severe stenosis of the distal P2 segment of the left posterior cerebral artery, which remains patent distally. 3. Right-sided aortic arch with aberrant left subclavian artery, which is surgically occluded. Patent graft between the left common carotid and left subclavian arteries. 4. Multifocal ground-glass opacity in the right lung apex, which may indicate pneumonia. Aortic Atherosclerosis (ICD10-I70.0). Electronically Signed   By: KUlyses JarredM.D.   On: 02/07/2021 21:19   DG Chest Portable 1 View  Result Date: 02/07/2021 CLINICAL DATA:  Altered level of consciousness, weakness EXAM: PORTABLE CHEST 1 VIEW COMPARISON:  03/31/2020 FINDINGS: Single frontal view of the chest demonstrates an unremarkable cardiac silhouette. No acute airspace disease, effusion, or pneumothorax. No acute bony abnormalities. IMPRESSION: 1. No acute intrathoracic process. Electronically Signed   By: MRanda NgoM.D.   On: 02/07/2021 21:48   EEG adult  Result Date: 02/08/2021 KGreta Doom MD     02/08/2021  7:43 PM History: 51yo F with altered mental status Sedation: Ativan given during study Technique: This 70  minute video EEG was acquired with electrodes placed according to the International 10-20 electrode system (including Fp1, Fp2, F3, F4, C3, C4, P3, P4, O1, O2, T3, T4, T5, T6, A1, A2, Fz, Cz, Pz). The following electrodes were missing or displaced: none. Background: The background consists of generalized 5 -6 Hz theta activity with  superimposed bifrontally predominant sharp wave discharges varying between 2 - 3 Hz, occasionally interspersed with a run of 5-6 hz activity. Following ativan administration, this activity markedly improves, though throughout the study there continues to be occasional bifrontal sharp wave discharges. The EEG improvement was accompanied by clinical improvement with the patient beginning to speak Photic stimulation: Physiologic driving is not performed EEG Abnormalities: 1) Frontal status epilepticus Clinical Interpretation: This EEG recorded evidence of focal frontal status epilepticus  with resolution following ativan administration. [name], MD [facility] [phone] If 7pm- 7am, please page neurology on call as listed in [place].      Assessment/Plan Principal Problem:   Status epilepticus ([place]) Active Problems:   Demyelinating disease ([place])   Incomplete bladder emptying   Hypothyroidism   H/O gastric bypass   Hypertensive urgency   Diabetes mellitus without complication (HCC)   History of ESBL E. coli infection    Status epilepticus -EEG showed partial status epilepticus features.  Discussed with on-call neurologist Dr. Alferd Patee.  Continuous EEG will be ordered by neurology.  We will keep patient on IV Keppra and as needed Ativan as needed. Hypertensive urgency -at admission patient was found to have markedly elevated blood pressure with systolic more than 629.  After giving IV labetalol patient was started on Cardene at [facility].  At this time patient is off Cardene and blood pressures around 476L systolic at the time of my exam.  We will keep patient on p.o. and IV labetalol and closely monitor blood pressure trends. UTI -has had previous episode of ESBL infection.  I had extensive discussion with pharmacist.  Recent infections were none ESBL infections and were sensitive to vancomycin and cefepime.  Which will be continued for now and follow  blood and urine cultures. Hypothyroidism we will keep patient IV Synthroid. Diabetes mellitus type 2 we will keep patient on sliding scale coverage. History of demyelinating disease followed by neurologist.  Last seen on December 2022.  Patient takes Nurtec, baclofen and gabapentin which can be receiving once patient can take orally. Chronic kidney disease stage III creatinine appears to be at baseline. History of depression and anxiety presently NPO.  Takes Pristiq Wellbutrin alprazolam trazodone at home which can be resumed once patient is more alert awake and can take orally. History of neurogenic bladder used to self cath but recently as per the patient's family has not been using.  We will need to check a bladder scan to make sure there is no retention. CT angiogram of the head and neck done did show some opacities in the lung but there was no definite signs of pneumonia clinically.  However patient is empirically on antibiotics for UTI.  Since patient has status epilepticus will need close monitoring and inpatient status.   DVT prophylaxis: Heparin. Code Status: Full code. Family Communication: We will need to discuss with family. Disposition Plan: To be determined. Consults called: Neurology. Admission status: Inpatient.   Rise Patience MD [facility] Pager [phone].  If 7PM-7AM, please contact night-coverage [email or web address] Password [email or web address]  02/08/2021, 10:10 PM

## 2021-02-08 NOTE — Progress Notes (Signed)
PROGRESS NOTE    English Tomer  ZOX:[id number] DOB: 1970-12-09 DOA: 02/07/2021 PCP: [name], MD  ED10A/ED10A   Assessment & Plan:   Principal Problem:   Acute focal neurological deficit Active Problems:   Depression with anxiety   Demyelinating disease ([place])   Chronic systolic CHF (congestive heart failure) (HCC)   Aberrant subclavian artery   Hypothyroidism   History of CVA with residual deficit   CAD (coronary artery disease)   Hypertensive urgency   Abnormal LFTs   Diabetes mellitus without complication ([place])   Chronic kidney disease, stage III (moderate) (HCC)   Neurogenic bladder   Intermittent self-catheterization of bladder   History of ESBL E. coli infection   Altered mental status   Ryanna Marita [place] [name] is a 51 y.o. female with medical history significant for aberrant subclavian artery with recurrent CVA s/p carotid endarterectomy, poorly controlled HTN, CKD 3, anxiety and depression, CAD s/p stenting, migraines, , hypothyroidism, demyelinating disease, followed by neurology, last seen 01/17/2021 with n normal EEG on 01/28/2021, neurogenic bladder who self catheterizes 4 times daily, with history of of ESBL UTI on prophylactic antibiotics, history of gastric bypass with history of anastomotic ulcer currently followed by GI, who was brought to the ED with altered mental status, with last known normal on the night of 02/06/2021.  Pt was unable to provide hx.  Son said this was how pt presented when she had her previous UTI.    Partial status epilepticus --had normal EEG on 12/27, however, EEG during current admission showed partial status epilepticus.   --s/p IV ativan x2 with improvement in pt's responsiveness Plan: --start IV Keppra --transfer to [facility] for continuous EEG monitoring, per neuro rec  Hypertensive emergency  - BP 246/112 in ED - S/p IV labetalol in the ED and started on Cardene gtt Plan: --cont Cardene gtt for BP control --resume home  carvedilol, furosemide, Imdur when able to take oral meds  Possible UTI --per son, pt had frequent UTI that would present with AMS. --UA obtained on presentation, but urine cx not ordered. --started abx on presentation.   Plan: --add on urine cx to urine collected prior to abx  --cont ceftriaxone for now  Hx of Neurogenic bladder --used to self cath, however, son said pt hasn't been doing self cath recently --has been having urine output since presentation. --bladder scan to monitor for retention  Possible multifocal pneumonia on CT, ruled out - Patient not symptomatic for pneumonia.  CXR clear.  Procal only 0.14.  No hypoxia or respiratory symptoms. - Received Rocephin and azithromycin in the ED --will not continue tx for PNA  Demyelinating disease - Followed by neurology, last seen December 2022 with MRI showing progressive disease - resume home Nurtec, baclofen, gabapentin when able to take oral meds     Depression with anxiety --Resume home Pristiq, Wellbutrin, alprazolam, trazodone when able to take oral meds     Hypothyroidism - resume home levothyroxine when able to take oral meds     CAD (coronary artery disease) with history of stent - resume home aspirin, Plavix, rosuvastatin when able to take oral meds     Abnormal LFTs - Uncertain etiology - Continue to monitor with further work-up if uptrending   Hx of Diabetes mellitus without complication ([place]), not currently active --A1c has been wnl for the past year --elevated BG likely due to stress response --continue BG checks to monitor for hypoglycemia due to NPO status     Chronic  kidney disease, stage III (moderate) (HCC) - Renal function at baseline   History of gastric bypass with anastomotic ulcer - resume home Protonix, Phenergan, Lomotil as needed, Baqsimi when able to take oral   Migraines - resume Aimovig when able to take oral    DVT prophylaxis: Lovenox SQ Code Status: Full code  Family  Communication: son updated at bedside today  Level of care: Stepdown Dispo:   The patient is from: home Anticipated d/c is to: transfer to [place] Anticipated d/c date is: whenever bed available   Subjective and Interval History:  Pt was agitated this morning, but calmed down with IV haldol, however, continued to be unresponsive to questions or commands.  BP remained elevated without Cardene gtt.  EEG later found pt in partial status epilepticus.  Neuro rec transfer to [facility] for continuous EEG monitoring.   Objective: Vitals:   02/08/21 0830 02/08/21 0900 02/08/21 1100 02/08/21 1400  BP: 133/82 121/75 (!) 179/98 117/77  Pulse: (!) 131 (!) 104 (!) 111 (!) 116  Resp: (!) 23 17 14 17   Temp: 97.6 F (36.4 C)     TempSrc: Axillary     SpO2: 99% 96% 99% 97%  Weight:      Height:       No intake or output data in the 24 hours ending 02/08/21 1709 Filed Weights   02/07/21 1225  Weight: 71.7 kg    Examination:   Constitutional: NAD, awake but starring into space HEENT: conjunctivae and lids normal CV: No cyanosis.   RESP: normal respiratory effort, on RA Extremities: No effusions, edema in BLE SKIN: warm, dry   Data Reviewed: I have personally reviewed following labs and imaging studies  CBC: Recent Labs  Lab 02/07/21 1227  WBC 12.6*  HGB 15.4*  HCT 49.1*  MCV 82.2  PLT 836   Basic Metabolic Panel: Recent Labs  Lab 02/07/21 1227 02/08/21 0702  NA 141  --   K 4.5  --   CL 107  --   CO2 25  --   GLUCOSE 160*  --   BUN 16  --   CREATININE 1.32* 1.17*  CALCIUM 9.6  --    GFR: Estimated Creatinine Clearance: 53.4 mL/min (A) (by C-G formula based on SCr of 1.17 mg/dL (H)). Liver Function Tests: Recent Labs  Lab 02/07/21 1227  AST 55*  ALT 74*  ALKPHOS 172*  BILITOT 0.7  PROT 7.6  ALBUMIN 4.1   No results for input(s): LIPASE, AMYLASE in the last 168 hours. No results for input(s): AMMONIA in the last 168 hours. Coagulation Profile: No results for  input(s): INR, PROTIME in the last 168 hours. Cardiac Enzymes: No results for input(s): CKTOTAL, CKMB, CKMBINDEX, TROPONINI in the last 168 hours. BNP (last 3 results) No results for input(s): PROBNP in the last 8760 hours. HbA1C: Recent Labs    02/08/21 0702  HGBA1C 5.6   CBG: Recent Labs  Lab 02/07/21 1230 02/08/21 0456 02/08/21 0745 02/08/21 1120 02/08/21 1629  GLUCAP 156* 192* 201* 148* 161*   Lipid Profile: Recent Labs    02/08/21 0702  CHOL 158  HDL 40*  LDLCALC 95  TRIG 117  CHOLHDL 4.0   Thyroid Function Tests: No results for input(s): TSH, T4TOTAL, FREET4, T3FREE, THYROIDAB in the last 72 hours. Anemia Panel: No results for input(s): VITAMINB12, FOLATE, FERRITIN, TIBC, IRON, RETICCTPCT in the last 72 hours. Sepsis Labs: Recent Labs  Lab 02/07/21 1227 02/08/21 0702  PROCALCITON  --  0.14  LATICACIDVEN  1.6 1.8    Recent Results (from the past 240 hour(s))  Resp Panel by RT-PCR (Flu A&B, Covid) Nasopharyngeal Swab     Status: None   Collection Time: 02/07/21 10:29 PM   Specimen: Nasopharyngeal Swab; Nasopharyngeal(NP) swabs in vial transport medium  Result Value Ref Range Status   SARS Coronavirus 2 by RT PCR NEGATIVE NEGATIVE Final    Comment: (NOTE) SARS-CoV-2 target nucleic acids are NOT DETECTED.  The SARS-CoV-2 RNA is generally detectable in upper respiratory specimens during the acute phase of infection. The lowest concentration of SARS-CoV-2 viral copies this assay can detect is 138 copies/mL. A negative result does not preclude SARS-Cov-2 infection and should not be used as the sole basis for treatment or other patient management decisions. A negative result may occur with  improper specimen collection/handling, submission of specimen other than nasopharyngeal swab, presence of viral mutation(s) within the areas targeted by this assay, and inadequate number of viral copies(<138 copies/mL). A negative result must be combined with clinical  observations, patient history, and epidemiological information. The expected result is Negative.  Fact Sheet for Patients:  EntrepreneurPulse.com.au  Fact Sheet for Healthcare Providers:  IncredibleEmployment.be  This test is no t yet approved or cleared by the [place] FDA and  has been authorized for detection and/or diagnosis of SARS-CoV-2 by FDA under an Emergency Use Authorization (EUA). This EUA will remain  in effect (meaning this test can be used) for the duration of the COVID-19 declaration under Section 564(b)(1) of the Act, 21 U.S.C.section 360bbb-3(b)(1), unless the authorization is terminated  or revoked sooner.       Influenza A by PCR NEGATIVE NEGATIVE Final   Influenza B by PCR NEGATIVE NEGATIVE Final    Comment: (NOTE) The Xpert Xpress SARS-CoV-2/FLU/RSV plus assay is intended as an aid in the diagnosis of influenza from Nasopharyngeal swab specimens and should not be used as a sole basis for treatment. Nasal washings and aspirates are unacceptable for Xpert Xpress SARS-CoV-2/FLU/RSV testing.  Fact Sheet for Patients: EntrepreneurPulse.com.au  Fact Sheet for Healthcare Providers: IncredibleEmployment.be  This test is not yet approved or cleared by the [place] FDA and has been authorized for detection and/or diagnosis of SARS-CoV-2 by FDA under an Emergency Use Authorization (EUA). This EUA will remain in effect (meaning this test can be used) for the duration of the COVID-19 declaration under Section 564(b)(1) of the Act, 21 U.S.C. section 360bbb-3(b)(1), unless the authorization is terminated or revoked.  Performed at [facility], [address] [place], [place] [address]   Blood culture (routine x 2)     Status: None (Preliminary result)   Collection Time: 02/07/21 10:29 PM   Specimen: BLOOD  Result Value Ref Range Status   Specimen Description BLOOD RIGHT  ANTECUBITAL  Final   Special Requests   Final    BOTTLES DRAWN AEROBIC AND ANAEROBIC Blood Culture results may not be optimal due to an inadequate volume of blood received in culture bottles   Culture   Final    NO GROWTH < 12 HOURS Performed at [facility], [address], [place], [place] [address]    Report Status PENDING  Incomplete      Radiology Studies: CT ANGIO HEAD NECK W WO CM  Result Date: 02/07/2021 CLINICAL DATA:  Encephalopathy EXAM: CT ANGIOGRAPHY HEAD AND NECK TECHNIQUE: Multidetector CT imaging of the head and neck was performed using the standard protocol during bolus administration of intravenous contrast. Multiplanar CT image reconstructions and MIPs were  obtained to evaluate the vascular anatomy. Carotid stenosis measurements (when applicable) are obtained utilizing NASCET criteria, using the distal internal carotid diameter as the denominator. CONTRAST:  23mL OMNIPAQUE IOHEXOL 350 MG/ML SOLN COMPARISON:  12/05/2020 FINDINGS: CT HEAD FINDINGS Brain: There is no mass, hemorrhage or extra-axial collection. The size and configuration of the ventricles and extra-axial CSF spaces are normal. There is an old right basal ganglia small vessel infarct. There is hypoattenuation of the periventricular white matter, most commonly indicating chronic ischemic microangiopathy. Skull: The visualized skull base, calvarium and extracranial soft tissues are normal. Sinuses/Orbits: No fluid levels or advanced mucosal thickening of the visualized paranasal sinuses. No mastoid or middle ear effusion. The orbits are normal. CTA NECK FINDINGS SKELETON: There is no bony spinal canal stenosis. No lytic or blastic lesion. OTHER NECK: Normal pharynx, larynx and major salivary glands. No cervical lymphadenopathy. Unremarkable thyroid gland. UPPER CHEST: Multifocal ground-glass opacity in the right lung apex. AORTIC ARCH: There is calcific atherosclerosis of the aortic arch. There is no aneurysm,  dissection or hemodynamically significant stenosis of the visualized portion of the aorta. There is a right-sided aortic arch with aberrant left subclavian artery, which is surgically occluded. Patent graft between the left common carotid and left subclavian arteries. RIGHT CAROTID SYSTEM: No dissection, occlusion or aneurysm. Mild atherosclerotic calcification at the carotid bifurcation without hemodynamically significant stenosis. LEFT CAROTID SYSTEM: Early bifurcation. No stenosis or other abnormality. VERTEBRAL ARTERIES: Right dominant configuration. Both origins are clearly patent. There is no dissection, occlusion or flow-limiting stenosis to the skull base (V1-V3 segments). CTA HEAD FINDINGS POSTERIOR CIRCULATION: --Vertebral arteries: Bilateral atherosclerotic calcification. --Inferior cerebellar arteries: Normal. --Basilar artery: Normal. --Superior cerebellar arteries: Normal. --Posterior cerebral arteries (PCA): Severe stenosis of the distal P2 segment of the left PCA, which remains patent distally. The right PCA is predominantly supplied by the posterior communicating artery. ANTERIOR CIRCULATION: --Intracranial internal carotid arteries: Atherosclerotic calcification of the internal carotid arteries at the skull base without hemodynamically significant stenosis. --Anterior cerebral arteries (ACA): Normal. Absent left A1 segment, normal variant --Middle cerebral arteries (MCA): Mild narrowing of the distal left M1 segment. Normal right MCA. VENOUS SINUSES: As permitted by contrast timing, patent. Review of the MIP images confirms the above findings. IMPRESSION: 1. No emergent large vessel occlusion. 2. Unchanged severe stenosis of the distal P2 segment of the left posterior cerebral artery, which remains patent distally. 3. Right-sided aortic arch with aberrant left subclavian artery, which is surgically occluded. Patent graft between the left common carotid and left subclavian arteries. 4. Multifocal  ground-glass opacity in the right lung apex, which may indicate pneumonia. Aortic Atherosclerosis (ICD10-I70.0). Electronically Signed   By: Ulyses Jarred M.D.   On: 02/07/2021 21:19   DG Chest Portable 1 View  Result Date: 02/07/2021 CLINICAL DATA:  Altered level of consciousness, weakness EXAM: PORTABLE CHEST 1 VIEW COMPARISON:  03/31/2020 FINDINGS: Single frontal view of the chest demonstrates an unremarkable cardiac silhouette. No acute airspace disease, effusion, or pneumothorax. No acute bony abnormalities. IMPRESSION: 1. No acute intrathoracic process. Electronically Signed   By: Randa Ngo M.D.   On: 02/07/2021 21:48     Scheduled Meds:   stroke: mapping our early stages of recovery book   Does not apply Once   aspirin  324 mg Oral Once   enoxaparin (LOVENOX) injection  40 mg Subcutaneous QHS   insulin aspart  0-15 Units Subcutaneous Q4H   Continuous Infusions:  cefTRIAXone (ROCEPHIN)  IV Stopped (02/07/21 2305)   niCARDipine 7.5 mg/hr (  02/08/21 1500)     LOS: 1 day     Enzo Bi, MD [facility] If 7PM-7AM, please contact night-coverage 02/08/2021, 5:09 PM

## 2021-02-08 NOTE — Discharge Summary (Addendum)
Physician Discharge Summary   [name]  female DOB: August 16, 1970  JJH:[id number]  PCP: Kendra Sons, MD  Admit date: 02/07/2021 Discharge date: 02/08/2021  Admitted From: home Disposition:  [facility] CODE STATUS: Full code   Hospital Course:  For full details, please see H&P, progress notes, consult notes and ancillary notes.  Briefly,  [name] is a 51 y.o. female with medical history significant for aberrant subclavian artery with recurrent CVA s/p carotid endarterectomy, poorly controlled HTN, CKD 3, anxiety and depression, CAD s/p stenting, migraines, hypothyroidism, demyelinating disease, followed by neurology, neurogenic bladder, with history of of ESBL UTI on prophylactic antibiotics, history of gastric bypass with history of anastomotic ulcer currently followed by GI, who was brought to the ED with altered mental status, with last known normal on the night of 02/06/2021.  Pt was unable to provide hx.  Son said this was how pt presented when she had her previous UTI.   AMS 2/2 Partial status epilepticus --had normal EEG on 12/27, however, EEG during current admission showed partial status epilepticus.   --s/p IV ativan x2 with improvement in pt's responsiveness --received IV Keppra and started on 1 g twice daily by neuro --transfer to [facility] for continuous EEG monitoring, per neuro rec   Hypertensive emergency  - BP 246/112 in ED - S/p IV labetalol in the ED and started on Cardene gtt --cont Cardene gtt for BP control --resume home carvedilol, furosemide, Imdur when able to take oral meds   Possible UTI history of of ESBL UTI on prophylactic antibiotics --per son, pt had frequent UTI that would present with AMS.  According to home med list, pt was on nitrofurantoin for ppx. --UA obtained on presentation, urine cx added-on later. --started abx on presentation.   --cont ceftriaxone for now pending urine cx result   Hx of Neurogenic bladder --used to  self cath, however, son said pt hasn't been doing self cath recently --has been having urine output since presentation. --bladder scan to monitor for retention  Possible multifocal pneumonia on CT, ruled out - Patient not symptomatic for pneumonia.  CXR clear.  Procal only 0.14.  No hypoxia or respiratory symptoms. - Received Rocephin and azithromycin in the ED --will not continue tx for PNA  Demyelinating disease - Followed by neurology, last seen December 2022 with MRI showing progressive disease -resume home Nurtec, baclofen, gabapentin when able to take oral meds     Depression with anxiety --Resume home Pristiq, Wellbutrin, alprazolam, trazodone when able to take oral meds     Hypothyroidism - resume home levothyroxine when able to take oral meds     CAD (coronary artery disease) with history of stent - resume home aspirin, Plavix, rosuvastatin when able to take oral meds     Abnormal LFTs, mild - Uncertain etiology - Continue to monitor with further work-up if uptrending   Hx of Diabetes mellitus without complication ([place]), not currently active --A1c has been wnl for the past year --elevated BG likely due to stress response --continue BG checks to monitor for hypoglycemia due to NPO status     Chronic kidney disease, stage IIIa (moderate) (HCC) - Renal function at baseline   History of gastric bypass with anastomotic ulcer - resume home Protonix, Phenergan, Lomotil as needed, when able to take oral   Migraines --on home Aimovig    Discharge Diagnoses:  Principal Problem:   Acute focal neurological deficit Active Problems:   Depression with anxiety  Demyelinating disease ([place])   Chronic systolic CHF (congestive heart failure) (HCC)   Aberrant subclavian artery   Hypothyroidism   History of CVA with residual deficit   CAD (coronary artery disease)   Hypertensive urgency   Abnormal LFTs   Diabetes mellitus without complication (HCC)   Chronic kidney disease,  stage III (moderate) (HCC)   Neurogenic bladder   Intermittent self-catheterization of bladder   History of ESBL E. coli infection   Altered mental status   30 Day Unplanned Readmission Risk Score    Flowsheet Row ED from 02/07/2021 in [facility]  30 Day Unplanned Readmission Risk Score (%) 22.77 Filed at 02/08/2021 1600       This score is the patient's risk of an unplanned readmission within 30 days of being discharged (0 -100%). The score is based on dignosis, age, lab data, medications, orders, and past utilization.   Low:  0-14.9   Medium: 15-21.9   High: 22-29.9   Extreme: 30 and above         Discharge Instructions:  Allergies as of 02/08/2021       Reactions   Lipitor [atorvastatin] Other (See Comments)   Leg pains        Medication List     STOP taking these medications    Baqsimi One Pack 3 MG/DOSE Powd Generic drug: Glucagon   doxycycline 100 MG tablet Commonly known as: VIBRA-TABS       TAKE these medications    Aimovig 140 MG/ML Soaj Generic drug: Erenumab-aooe Inject into the skin.   ALPRAZolam 1 MG tablet Commonly known as: XANAX Take 1 tablet by mouth twice daily   aspirin 81 MG EC tablet Take 1 tablet (81 mg total) by mouth daily. Swallow whole.   Baclofen 5 MG Tabs Take 1 tablet by mouth 2 (two) times daily as needed.   benzonatate 100 MG capsule Commonly known as: TESSALON Take 1 capsule (100 mg total) by mouth 3 (three) times daily as needed for cough.   buPROPion ER 100 MG 12 hr tablet Commonly known as: WELLBUTRIN SR Take 100 mg by mouth 2 (two) times daily.   carvedilol 12.5 MG tablet Commonly known as: COREG Take 1 tablet by mouth twice daily   clopidogrel 75 MG tablet Commonly known as: PLAVIX Take 1 tablet (75 mg total) by mouth daily.   desvenlafaxine 50 MG 24 hr tablet Commonly known as: PRISTIQ Take 50 mg by mouth daily.   diclofenac Sodium 1 % Gel Commonly known as:  VOLTAREN Apply 2 g topically 4 (four) times daily.   diphenoxylate-atropine 2.5-0.025 MG tablet Commonly known as: Lomotil Take 1 tablet by mouth 4 (four) times daily as needed for diarrhea or loose stools.   estradiol 0.1 MG/GM vaginal cream Commonly known as: ESTRACE Estrogen Cream Instruction Discard applicator Apply pea sized amount to tip of finger to urethra before bed. Wash hands well after application. Use Monday, Wednesday and Friday   furosemide 20 MG tablet Commonly known as: LASIX Take 1 tablet by mouth every day as needed for swelling   gabapentin 300 MG capsule Commonly known as: NEURONTIN Take 2 capsules (600 mg total) by mouth 2 (two) times daily.   glucose 4 GM chewable tablet Chew 1 tablet (4 g total) by mouth 3 (three) times daily as needed for low blood sugar. Home med. What changed:  when to take this reasons to take this additional instructions   isosorbide mononitrate 120 MG 24 hr  tablet Commonly known as: IMDUR Take 1 tablet by mouth every day   levETIRAcetam 1000 MG/100ML Soln Commonly known as: KEPPRA Inject 100 mLs (1,000 mg total) into the vein every 12 (twelve) hours.   levothyroxine 25 MCG tablet Commonly known as: SYNTHROID Take 1 tablet by mouth 30 minutes before breakfast   nitrofurantoin 100 MG capsule Commonly known as: Macrodantin Hold while on IV antibiotic. What changed:  how much to take how to take this when to take this additional instructions   nitroGLYCERIN 0.4 MG SL tablet Commonly known as: NITROSTAT Dissolve 1 tablet under tongue every 46mins, up to 3 doses, for chest pain. Max 3 doses in 76mins.   Nurtec 75 MG Tbdp Generic drug: Rimegepant Sulfate Take 75 mg by mouth as needed.   pantoprazole 40 MG tablet Commonly known as: PROTONIX Take 1 tablet (40 mg total) by mouth 2 (two) times daily.   promethazine 25 MG tablet Commonly known as: PHENERGAN Take 1 tablet (25 mg total) by mouth every 6 (six) hours as needed  for nausea or vomiting.   rosuvastatin 40 MG tablet Commonly known as: CRESTOR Take 1 tablet (40 mg total) by mouth daily at 6 PM.   SYSTANE OP Place 1 drop into both eyes daily as needed (dry eyes).   traMADol 50 MG tablet Commonly known as: ULTRAM Take 1 tablet (50 mg total) by mouth every 6 (six) hours as needed.   traZODone 100 MG tablet Commonly known as: DESYREL Take 1 to 2 tablets by mouth at bedtime          Allergies  Allergen Reactions   Lipitor [Atorvastatin] Other (See Comments)    Leg pains     The results of significant diagnostics from this hospitalization (including imaging, microbiology, ancillary and laboratory) are listed below for reference.   Consultations:   Procedures/Studies: CT ANGIO HEAD NECK W WO CM  Result Date: 02/07/2021 CLINICAL DATA:  Encephalopathy EXAM: CT ANGIOGRAPHY HEAD AND NECK TECHNIQUE: Multidetector CT imaging of the head and neck was performed using the standard protocol during bolus administration of intravenous contrast. Multiplanar CT image reconstructions and MIPs were obtained to evaluate the vascular anatomy. Carotid stenosis measurements (when applicable) are obtained utilizing NASCET criteria, using the distal internal carotid diameter as the denominator. CONTRAST:  11mL OMNIPAQUE IOHEXOL 350 MG/ML SOLN COMPARISON:  12/05/2020 FINDINGS: CT HEAD FINDINGS Brain: There is no mass, hemorrhage or extra-axial collection. The size and configuration of the ventricles and extra-axial CSF spaces are normal. There is an old right basal ganglia small vessel infarct. There is hypoattenuation of the periventricular white matter, most commonly indicating chronic ischemic microangiopathy. Skull: The visualized skull base, calvarium and extracranial soft tissues are normal. Sinuses/Orbits: No fluid levels or advanced mucosal thickening of the visualized paranasal sinuses. No mastoid or middle ear effusion. The orbits are normal. CTA NECK FINDINGS  SKELETON: There is no bony spinal canal stenosis. No lytic or blastic lesion. OTHER NECK: Normal pharynx, larynx and major salivary glands. No cervical lymphadenopathy. Unremarkable thyroid gland. UPPER CHEST: Multifocal ground-glass opacity in the right lung apex. AORTIC ARCH: There is calcific atherosclerosis of the aortic arch. There is no aneurysm, dissection or hemodynamically significant stenosis of the visualized portion of the aorta. There is a right-sided aortic arch with aberrant left subclavian artery, which is surgically occluded. Patent graft between the left common carotid and left subclavian arteries. RIGHT CAROTID SYSTEM: No dissection, occlusion or aneurysm. Mild atherosclerotic calcification at the carotid bifurcation without hemodynamically significant  stenosis. LEFT CAROTID SYSTEM: Early bifurcation. No stenosis or other abnormality. VERTEBRAL ARTERIES: Right dominant configuration. Both origins are clearly patent. There is no dissection, occlusion or flow-limiting stenosis to the skull base (V1-V3 segments). CTA HEAD FINDINGS POSTERIOR CIRCULATION: --Vertebral arteries: Bilateral atherosclerotic calcification. --Inferior cerebellar arteries: Normal. --Basilar artery: Normal. --Superior cerebellar arteries: Normal. --Posterior cerebral arteries (PCA): Severe stenosis of the distal P2 segment of the left PCA, which remains patent distally. The right PCA is predominantly supplied by the posterior communicating artery. ANTERIOR CIRCULATION: --Intracranial internal carotid arteries: Atherosclerotic calcification of the internal carotid arteries at the skull base without hemodynamically significant stenosis. --Anterior cerebral arteries (ACA): Normal. Absent left A1 segment, normal variant --Middle cerebral arteries (MCA): Mild narrowing of the distal left M1 segment. Normal right MCA. VENOUS SINUSES: As permitted by contrast timing, patent. Review of the MIP images confirms the above findings.  IMPRESSION: 1. No emergent large vessel occlusion. 2. Unchanged severe stenosis of the distal P2 segment of the left posterior cerebral artery, which remains patent distally. 3. Right-sided aortic arch with aberrant left subclavian artery, which is surgically occluded. Patent graft between the left common carotid and left subclavian arteries. 4. Multifocal ground-glass opacity in the right lung apex, which may indicate pneumonia. Aortic Atherosclerosis (ICD10-I70.0). Electronically Signed   By: Ulyses Jarred M.D.   On: 02/07/2021 21:19   DG Chest Portable 1 View  Result Date: 02/07/2021 CLINICAL DATA:  Altered level of consciousness, weakness EXAM: PORTABLE CHEST 1 VIEW COMPARISON:  03/31/2020 FINDINGS: Single frontal view of the chest demonstrates an unremarkable cardiac silhouette. No acute airspace disease, effusion, or pneumothorax. No acute bony abnormalities. IMPRESSION: 1. No acute intrathoracic process. Electronically Signed   By: Randa Ngo M.D.   On: 02/07/2021 21:48   EEG adult  Result Date: 01/28/2021 Marcial Pacas, MD     02/05/2021  4:47 PM HISTORY: 51 year old female, complains of frequent migraine headaches, MRI of the brain showed extensive supratentorium T2/FLAIR hyperintensity involvement TECHNIQUE: This is a routine 16 channel EEG recording with one channel devoted to a limited EKG recording.  It was performed during wakefulness, drowsiness and asleep.  Hyperventilation and photic stimulation were performed as activating procedures.  There are minimum muscle and movement artifact noted. Upon maximum arousal, posterior dominant waking rhythm consistent of rhythmic alpha range activity, with frequency of 8 Hz. Activities are symmetric over the bilateral posterior derivations and attenuated with eye opening. Hyperventilation produced mild/moderate buildup with higher amplitude and the slower activities noted. Photic stimulation did not alter the tracing. During EEG recording, patient  developed drowsiness and no deeper stage of sleep was achieved. During EEG recording, there was no epileptiform discharge noted. EKG demonstrate sinus rhythm, with heart rate of 64 bpm CONCLUSION: This is a  normal awake EEG.  There is no electrodiagnostic evidence of epileptiform discharge. Marcial Pacas, M.D. Ph.D. [facility] [place], [place] [address] Phone: [phone] Fax:      [phone]   DG FL GUIDED LUMBAR PUNCTURE  Result Date: 01/22/2021 CLINICAL DATA:  51 year old female with extremity tingling and numbness. There is clinical concern for possible demyelinating process. She presents for lumbar puncture to acquire CSF for labs. EXAM: DIAGNOSTIC LUMBAR PUNCTURE UNDER FLUOROSCOPIC GUIDANCE COMPARISON:  MRI head 12/05/2020 FLUOROSCOPY TIME:  Fluoroscopy Time:  0 minutes 27 seconds Radiation Exposure Index (if provided by the fluoroscopic device): 3.6 mGy Number of Acquired Spot Images: 0 PROCEDURE: Informed consent was obtained from the patient prior to the procedure, including potential complications  of headache, allergy, and pain. With the patient prone, the lower back was prepped with Betadine. 1% Lidocaine was used for local anesthesia. Lumbar puncture was performed at the L3-L4 level using a 20 gauge needle with return of clear CSF with an opening pressure of 12 cm water. Thirteen ml of CSF were obtained for laboratory studies. The patient tolerated the procedure well and there were no apparent complications. IMPRESSION: Technically successful L3-L4 lumbar puncture. Electronically Signed   By: Jacqulynn Cadet M.D.   On: 01/22/2021 16:15      Labs: BNP (last 3 results) No results for input(s): BNP in the last 8760 hours. Basic Metabolic Panel: Recent Labs  Lab 02/07/21 1227 02/08/21 0702  NA 141  --   K 4.5  --   CL 107  --   CO2 25  --   GLUCOSE 160*  --   BUN 16  --   CREATININE 1.32* 1.17*  CALCIUM 9.6  --    Liver Function Tests: Recent Labs   Lab 02/07/21 1227  AST 55*  ALT 74*  ALKPHOS 172*  BILITOT 0.7  PROT 7.6  ALBUMIN 4.1   No results for input(s): LIPASE, AMYLASE in the last 168 hours. No results for input(s): AMMONIA in the last 168 hours. CBC: Recent Labs  Lab 02/07/21 1227  WBC 12.6*  HGB 15.4*  HCT 49.1*  MCV 82.2  PLT 167   Cardiac Enzymes: No results for input(s): CKTOTAL, CKMB, CKMBINDEX, TROPONINI in the last 168 hours. BNP: Invalid input(s): POCBNP CBG: Recent Labs  Lab 02/07/21 1230 02/08/21 0456 02/08/21 0745 02/08/21 1120 02/08/21 1629  GLUCAP 156* 192* 201* 148* 161*   D-Dimer No results for input(s): DDIMER in the last 72 hours. Hgb A1c Recent Labs    02/08/21 0702  HGBA1C 5.6   Lipid Profile Recent Labs    02/08/21 0702  CHOL 158  HDL 40*  LDLCALC 95  TRIG 117  CHOLHDL 4.0   Thyroid function studies No results for input(s): TSH, T4TOTAL, T3FREE, THYROIDAB in the last 72 hours.  Invalid input(s): FREET3 Anemia work up No results for input(s): VITAMINB12, FOLATE, FERRITIN, TIBC, IRON, RETICCTPCT in the last 72 hours. Urinalysis    Component Value Date/Time   COLORURINE YELLOW (A) 02/07/2021 2229   APPEARANCEUR HAZY (A) 02/07/2021 2229   APPEARANCEUR Clear 12/19/2020 1411   LABSPEC 1.017 02/07/2021 2229   LABSPEC 1.012 12/29/2013 1210   PHURINE 5.0 02/07/2021 2229   GLUCOSEU NEGATIVE 02/07/2021 2229   GLUCOSEU >=500 12/29/2013 1210   HGBUR MODERATE (A) 02/07/2021 2229   HGBUR negative 04/14/2010 1435   BILIRUBINUR NEGATIVE 02/07/2021 2229   BILIRUBINUR Negative 12/19/2020 1411   BILIRUBINUR Negative 12/29/2013 1210   KETONESUR NEGATIVE 02/07/2021 2229   PROTEINUR 100 (A) 02/07/2021 2229   UROBILINOGEN 0.2 04/18/2020 1326   UROBILINOGEN negative 04/14/2010 1435   NITRITE NEGATIVE 02/07/2021 2229   LEUKOCYTESUR SMALL (A) 02/07/2021 2229   LEUKOCYTESUR Negative 12/29/2013 1210   Sepsis Labs Invalid input(s): PROCALCITONIN,  WBC,   LACTICIDVEN Microbiology Recent Results (from the past 240 hour(s))  Resp Panel by RT-PCR (Flu A&B, Covid) Nasopharyngeal Swab     Status: None   Collection Time: 02/07/21 10:29 PM   Specimen: Nasopharyngeal Swab; Nasopharyngeal(NP) swabs in vial transport medium  Result Value Ref Range Status   SARS Coronavirus 2 by RT PCR NEGATIVE NEGATIVE Final    Comment: (NOTE) SARS-CoV-2 target nucleic acids are NOT DETECTED.  The SARS-CoV-2 RNA is generally detectable in upper  respiratory specimens during the acute phase of infection. The lowest concentration of SARS-CoV-2 viral copies this assay can detect is 138 copies/mL. A negative result does not preclude SARS-Cov-2 infection and should not be used as the sole basis for treatment or other patient management decisions. A negative result may occur with  improper specimen collection/handling, submission of specimen other than nasopharyngeal swab, presence of viral mutation(s) within the areas targeted by this assay, and inadequate number of viral copies(<138 copies/mL). A negative result must be combined with clinical observations, patient history, and epidemiological information. The expected result is Negative.  Fact Sheet for Patients:  EntrepreneurPulse.com.au  Fact Sheet for Healthcare Providers:  IncredibleEmployment.be  This test is no t yet approved or cleared by the [place] FDA and  has been authorized for detection and/or diagnosis of SARS-CoV-2 by FDA under an Emergency Use Authorization (EUA). This EUA will remain  in effect (meaning this test can be used) for the duration of the COVID-19 declaration under Section 564(b)(1) of the Act, 21 U.S.C.section 360bbb-3(b)(1), unless the authorization is terminated  or revoked sooner.       Influenza A by PCR NEGATIVE NEGATIVE Final   Influenza B by PCR NEGATIVE NEGATIVE Final    Comment: (NOTE) The Xpert Xpress SARS-CoV-2/FLU/RSV plus  assay is intended as an aid in the diagnosis of influenza from Nasopharyngeal swab specimens and should not be used as a sole basis for treatment. Nasal washings and aspirates are unacceptable for Xpert Xpress SARS-CoV-2/FLU/RSV testing.  Fact Sheet for Patients: EntrepreneurPulse.com.au  Fact Sheet for Healthcare Providers: IncredibleEmployment.be  This test is not yet approved or cleared by the [place] FDA and has been authorized for detection and/or diagnosis of SARS-CoV-2 by FDA under an Emergency Use Authorization (EUA). This EUA will remain in effect (meaning this test can be used) for the duration of the COVID-19 declaration under Section 564(b)(1) of the Act, 21 U.S.C. section 360bbb-3(b)(1), unless the authorization is terminated or revoked.  Performed at [facility], [address] [place], [place] [address]   Blood culture (routine x 2)     Status: None (Preliminary result)   Collection Time: 02/07/21 10:29 PM   Specimen: BLOOD  Result Value Ref Range Status   Specimen Description BLOOD RIGHT ANTECUBITAL  Final   Special Requests   Final    BOTTLES DRAWN AEROBIC AND ANAEROBIC Blood Culture results may not be optimal due to an inadequate volume of blood received in culture bottles   Culture   Final    NO GROWTH < 12 HOURS Performed at [facility], [address] [place], [place] [address]    Report Status PENDING  Incomplete     Total time spend on discharging this patient, including the last patient exam, discussing the hospital stay, instructions for ongoing care as it relates to all pertinent caregivers, as well as preparing the medical discharge records, prescriptions, and/or referrals as applicable, is 60 minutes.    Enzo Bi, MD  [facility] 02/08/2021, 7:31 PM

## 2021-02-08 NOTE — Progress Notes (Signed)
vLTM setup all imp below 10kohms  [facility] to monitor  Patient event button tested

## 2021-02-08 NOTE — ED Notes (Signed)
Waiting for keppra at this time

## 2021-02-08 NOTE — Progress Notes (Signed)
PT Cancellation Note  Patient Details Name: [name] MRN: [id number] DOB: 09-14-1970   Cancelled Treatment:    Reason Eval/Treat Not Completed: Patient not medically ready. RN reports patient is not cooperative. Not appropriate for PT evaluation at this time. Will re-attempt tomorrow.      Jovonte Commins 02/08/2021, 12:09 PM

## 2021-02-09 DIAGNOSIS — E039 Hypothyroidism, unspecified: Secondary | ICD-10-CM

## 2021-02-09 DIAGNOSIS — Z9884 Bariatric surgery status: Secondary | ICD-10-CM

## 2021-02-09 DIAGNOSIS — R339 Retention of urine, unspecified: Secondary | ICD-10-CM

## 2021-02-09 DIAGNOSIS — Z8619 Personal history of other infectious and parasitic diseases: Secondary | ICD-10-CM

## 2021-02-09 DIAGNOSIS — G379 Demyelinating disease of central nervous system, unspecified: Secondary | ICD-10-CM

## 2021-02-09 DIAGNOSIS — E119 Type 2 diabetes mellitus without complications: Secondary | ICD-10-CM

## 2021-02-09 LAB — CBC WITH DIFFERENTIAL/PLATELET
Abs Immature Granulocytes: 0.04 10*3/uL (ref 0.00–0.07)
Basophils Absolute: 0.1 10*3/uL (ref 0.0–0.1)
Basophils Relative: 1 %
Eosinophils Absolute: 0 10*3/uL (ref 0.0–0.5)
Eosinophils Relative: 0 %
HCT: 44.8 % (ref 36.0–46.0)
Hemoglobin: 14.2 g/dL (ref 12.0–15.0)
Immature Granulocytes: 0 %
Lymphocytes Relative: 17 %
Lymphs Abs: 2.2 10*3/uL (ref 0.7–4.0)
MCH: 26.5 pg (ref 26.0–34.0)
MCHC: 31.7 g/dL (ref 30.0–36.0)
MCV: 83.6 fL (ref 80.0–100.0)
Monocytes Absolute: 0.9 10*3/uL (ref 0.1–1.0)
Monocytes Relative: 7 %
Neutro Abs: 9.9 10*3/uL — ABNORMAL HIGH (ref 1.7–7.7)
Neutrophils Relative %: 75 %
Platelets: 156 10*3/uL (ref 150–400)
RBC: 5.36 MIL/uL — ABNORMAL HIGH (ref 3.87–5.11)
RDW: 15.2 % (ref 11.5–15.5)
WBC: 13.1 10*3/uL — ABNORMAL HIGH (ref 4.0–10.5)
nRBC: 0 % (ref 0.0–0.2)

## 2021-02-09 LAB — GLUCOSE, CAPILLARY
Glucose-Capillary: 116 mg/dL — ABNORMAL HIGH (ref 70–99)
Glucose-Capillary: 122 mg/dL — ABNORMAL HIGH (ref 70–99)
Glucose-Capillary: 135 mg/dL — ABNORMAL HIGH (ref 70–99)
Glucose-Capillary: 152 mg/dL — ABNORMAL HIGH (ref 70–99)
Glucose-Capillary: 156 mg/dL — ABNORMAL HIGH (ref 70–99)
Glucose-Capillary: 163 mg/dL — ABNORMAL HIGH (ref 70–99)

## 2021-02-09 LAB — COMPREHENSIVE METABOLIC PANEL
ALT: 46 U/L — ABNORMAL HIGH (ref 0–44)
AST: 30 U/L (ref 15–41)
Albumin: 3.1 g/dL — ABNORMAL LOW (ref 3.5–5.0)
Alkaline Phosphatase: 127 U/L — ABNORMAL HIGH (ref 38–126)
Anion gap: 12 (ref 5–15)
BUN: 21 mg/dL — ABNORMAL HIGH (ref 6–20)
CO2: 23 mmol/L (ref 22–32)
Calcium: 8.7 mg/dL — ABNORMAL LOW (ref 8.9–10.3)
Chloride: 108 mmol/L (ref 98–111)
Creatinine, Ser: 1.79 mg/dL — ABNORMAL HIGH (ref 0.44–1.00)
GFR, Estimated: 34 mL/min — ABNORMAL LOW (ref >60)
Glucose, Bld: 126 mg/dL — ABNORMAL HIGH (ref 70–99)
Potassium: 4.7 mmol/L (ref 3.5–5.1)
Sodium: 143 mmol/L (ref 135–145)
Total Bilirubin: 0.5 mg/dL (ref 0.3–1.2)
Total Protein: 6.4 g/dL — ABNORMAL LOW (ref 6.5–8.1)

## 2021-02-09 MED ORDER — SODIUM CHLORIDE 0.9 % IV SOLN
200.0000 mg | Freq: Once | INTRAVENOUS | Status: AC
  Administered 2021-02-09: 200 mg via INTRAVENOUS
  Filled 2021-02-09: qty 20

## 2021-02-09 MED ORDER — NITROGLYCERIN 2 % TD OINT
0.5000 [in_us] | TOPICAL_OINTMENT | Freq: Four times a day (QID) | TRANSDERMAL | Status: DC
  Administered 2021-02-09 – 2021-02-10 (×2): 0.5 [in_us] via TOPICAL
  Filled 2021-02-09: qty 30

## 2021-02-09 MED ORDER — LORAZEPAM 2 MG/ML IJ SOLN: 1.0000 mg | INTRAMUSCULAR | Status: DC | PRN

## 2021-02-09 MED ORDER — LEVETIRACETAM IN NACL 500 MG/100ML IV SOLN
500.0000 mg | Freq: Once | INTRAVENOUS | Status: AC
  Administered 2021-02-09: 500 mg via INTRAVENOUS
  Filled 2021-02-09: qty 100

## 2021-02-09 MED ORDER — LEVOTHYROXINE SODIUM 100 MCG/5ML IV SOLN
12.5000 ug | Freq: Every day | INTRAVENOUS | Status: DC
  Administered 2021-02-09: 12.5 ug via INTRAVENOUS
  Filled 2021-02-09 (×2): qty 5

## 2021-02-09 MED ORDER — LACOSAMIDE 50 MG PO TABS
50.0000 mg | ORAL_TABLET | Freq: Two times a day (BID) | ORAL | Status: DC
  Administered 2021-02-10 – 2021-02-11 (×3): 50 mg via ORAL
  Filled 2021-02-09 (×3): qty 1

## 2021-02-09 MED ORDER — SODIUM CHLORIDE 0.9 % IV SOLN
1.0000 g | INTRAVENOUS | Status: DC
  Administered 2021-02-09 – 2021-02-10 (×2): 1 g via INTRAVENOUS
  Filled 2021-02-09 (×2): qty 10

## 2021-02-09 MED ORDER — ORAL CARE MOUTH RINSE
15.0000 mL | Freq: Two times a day (BID) | OROMUCOSAL | Status: DC
  Administered 2021-02-09 – 2021-02-11 (×5): 15 mL via OROMUCOSAL

## 2021-02-09 MED ORDER — HYDRALAZINE HCL 20 MG/ML IJ SOLN
10.0000 mg | INTRAMUSCULAR | Status: DC | PRN
  Administered 2021-02-09 – 2021-02-10 (×4): 10 mg via INTRAVENOUS
  Filled 2021-02-09 (×4): qty 1

## 2021-02-09 MED ORDER — HYDRALAZINE HCL 20 MG/ML IJ SOLN
10.0000 mg | Freq: Once | INTRAMUSCULAR | Status: AC
  Administered 2021-02-09: 10 mg via INTRAVENOUS
  Filled 2021-02-09: qty 1

## 2021-02-09 MED ORDER — CHLORHEXIDINE GLUCONATE 0.12 % MT SOLN
15.0000 mL | Freq: Two times a day (BID) | OROMUCOSAL | Status: DC
  Administered 2021-02-09 – 2021-02-11 (×6): 15 mL via OROMUCOSAL
  Filled 2021-02-09 (×6): qty 15

## 2021-02-09 MED ORDER — PROCHLORPERAZINE EDISYLATE 10 MG/2ML IJ SOLN
10.0000 mg | Freq: Four times a day (QID) | INTRAMUSCULAR | Status: DC | PRN
  Administered 2021-02-10: 10 mg via INTRAVENOUS
  Filled 2021-02-09: qty 2

## 2021-02-09 MED ORDER — LEVETIRACETAM IN NACL 1500 MG/100ML IV SOLN
1500.0000 mg | Freq: Two times a day (BID) | INTRAVENOUS | Status: DC
  Administered 2021-02-09 – 2021-02-10 (×2): 1500 mg via INTRAVENOUS
  Filled 2021-02-09 (×3): qty 100

## 2021-02-09 MED ORDER — ONDANSETRON HCL 4 MG/2ML IJ SOLN
4.0000 mg | Freq: Four times a day (QID) | INTRAMUSCULAR | Status: DC | PRN
  Administered 2021-02-09 – 2021-02-10 (×3): 4 mg via INTRAVENOUS
  Filled 2021-02-09 (×4): qty 2

## 2021-02-09 NOTE — Progress Notes (Signed)
LTM maintain at bedside. No skin breakdown noted. Results pending.

## 2021-02-09 NOTE — Procedures (Addendum)
Patient Name: [name]  MRN: [id number]  Epilepsy Attending: Lora Havens  Referring Physician/Provider: Dr Donnetta Simpers Duration: 02/08/2021 2347 to 02/09/2021 2347  Patient history: 51 yo F with altered mental status, routine eeg showed status epilepticus. LTM eeg to evaluate for seizure  Level of alertness: lethargic   AEDs during EEG study: Ativan  Technical aspects: This EEG study was done with scalp electrodes positioned according to the 10-20 International system of electrode placement. Electrical activity was acquired at a sampling rate of 500Hz  and reviewed with a high frequency filter of 70Hz  and a low frequency filter of 1Hz . EEG data were recorded continuously and digitally stored.   Description: The posterior dominant rhythm consists of 7.5 Hz activity of moderate voltage (25-35 uV) seen predominantly in posterior head regions, symmetric and reactive to eye opening and eye closing. EEG showed continuous generalized 5 to 7 Hz theta slowing. Intermittent generalized and maximal bifrontal periodic discharges were noted at 2-2.5Hz . Hyperventilation and photic stimulation were not performed.     ABNORMALITY - Periodic epileptiform discharges, generalized and maximal bifrontal( GPDs) - Continuous slow, generalized  IMPRESSION: This study showed generalized and maximal bifrontal epileptiform discharges at 2-2.5Hz  which is on the ictal-interictal continuum with high potential for seizure recurrence. Additionally, EEG is suggestive of moderate diffuse encephalopathy, nonspecific etiology. No definite seizures were seen throughout the recording.  Chancy Smigiel Barbra Sarks

## 2021-02-09 NOTE — Progress Notes (Addendum)
Pt admitted from [facility], arrived via stretcher by [facility]. Per report, nicardipine infusion stopped by [facility] medical staff during transport here. BP stable. Hospitalist at bedside to see pt and per MD ok to leave nicardipine off and SBP goal <160.

## 2021-02-09 NOTE — Progress Notes (Signed)
PROGRESS NOTE  Lynnmarie Lovett  RWE:[id number] DOB: Apr 21, 1970 DOA: 02/08/2021 PCP: [name], MD   Brief Narrative: [name] is a 51 y.o. female with a history of demyelinating disease, CVA w/residual left hemiplegia, CAD s/p multiple PCI, stage IIIb CKD, T2DM, HTN, migraine, hypothyroidism who presented initially to [facility] 1/6 for evaluation of confusion and periods of diminished responsiveness. She was afebrile with WBC 12.6k. CXR negative, UA with 21-50WBCs/HPF, rare bacteria. CTA head showed stable, known left PCA (P2) stenosis without acute LVO. BP severely elevated, for which cardene gtt was initiated with ultimate improvement and weaning off. EEG revealed bifrontal status epilepticus terminated with ativan. Continuous EEG was recommended for which she was transferred to [facility] late 1/7. MRI brain is also pending and neurology is following.   Assessment & Plan: Principal Problem:   Status epilepticus ([place]) Active Problems:   Demyelinating disease ([place])   Incomplete bladder emptying   Hypothyroidism   H/O gastric bypass   Hypertensive urgency   Diabetes mellitus without complication (HCC)   History of ESBL E. coli infection  Acute encephalopathy due to seizures:  - Monitor w/Tx as below  Focal status epilepticus: Improved with ativan. In setting of prior frontal CVA (right middle cerebellar peduncle on MRI 12/05/2020 with moderate chronic small vessel ischemic disease and cerebral atrophy. - Continue cEEG, titrate AEDs per neurology. Reloaded with keppra this AM. continue 1,500mg  IV q12h, prn ativan.  - Hx CVA and demyelinating disease: Check MRI brain  HTN urgency: Improved.  - Continue prn labetalol IV  Demyelinating disease:  - Restart baclofen, gabapentin when able. Stop tramadol.   Neurogenic bladder: Pt reports no longer self cathing, able to void.  - Bladder scan  History of ESBL UTI: Empirically given vancomycin and cefepime for concern of recurrent  infection, though pt not septic. PCT 0.14, remains afebrile.   - DC vancomycin to reduce renal toxicity. DC cefepime to rule this out as cause of encephalopathy/EEG changes.  - Urine culture now growing GNRs. Will reinitiate ceftriaxone empirically. Broaden coverage if pt becomes septic or tailor per culture. Plan on restarting home mabrobid ppx afterward.  CAD s/p PCI: No anginal complaints.  - ASA, plavix, coreg, rosuvastatin.   T2DM: History of this, though HbA1c 5.6%.  - Check CBG to r/o hypoglycemia due to NPO status as precipitant.  Stage IIIb CKD: Based on currently available CrCl.  - Avoid nephrotoxins/DC vancomycin for now. Monitor regularly.    Depression:  - Holding po medications. Will not be restarting wellbutrin, but once able, will restart trazodone, desvenlafaxine, prn alprazolam  GGO in right lung apex: Seen on CTA of the head and neck. No infiltrates on CXR, no sputum production. Pt had viral-sounding infection 2 weeks ago with cough which may explain opacities. PCT 0.14 not consistent with bacterial pneumonia.  - Incentive spirometry - Aspiration precautions - Monitor off abx.   Hypothyroidism:  - IV synthroid for now.   Nausea: QTC 474msec.  - prn zofran  Abnormal LFTs:  - Monitor  History of gastric bypass with anastomotic ulcer - Avoid NSAIDs - Continue PPI  Migraines: Quiescent.   DVT prophylaxis: Heparin  Code Status: Full Family Communication: Son at bedside Disposition Plan:  Status is: Inpatient  Remains inpatient appropriate because: AMS, workup of status epilepticus.  Consultants:  Neurology  Procedures:  cEEG  Antimicrobials: Ceftriaxone, azithromycin > vancomycin, cefepime >> ceftriaxone.    Subjective: More alert and wants to eat. Son says she's acting hangry.  She has difficulty following our conversation but has no other new complaints. Had cough about 2 weeks ago, resolved. Felt hot when EMS got her, but no recorded fevers.    Objective: Vitals:   02/09/21 0604 02/09/21 0814 02/09/21 0815 02/09/21 1209  BP: (!) 163/90 (!) 191/77 (!) 168/103 (!) 166/89  Pulse: 79 83 82 68  Resp: 15 17 15 16   Temp:  98.5 F (36.9 C)  98.2 F (36.8 C)  TempSrc:  Oral  Oral  SpO2: 97% 97% 98% 100%  Weight:        Intake/Output Summary (Last 24 hours) at 02/09/2021 1346 Last data filed at 02/09/2021 1200 Gross per 24 hour  Intake 1370.01 ml  Output 550 ml  Net 820.01 ml   Filed Weights   02/08/21 2131  Weight: 68.2 kg    Gen: Chronically ill-appearing female in no distress  Pulm: Non-labored breathing room air. Clear to auscultation bilaterally.  CV: Regular rate and rhythm. No murmur, rub, or gallop. No JVD, no pitting pedal edema. GI: Abdomen soft, non-tender, non-distended, with normoactive bowel sounds. No organomegaly or masses felt. Ext: Warm, no deformities Skin: Hemostatic puncture sites on extremities, no ulcers on visualized skin Neuro: Alert, now is oriented. No new deficits.  Psych: Judgement and insight appear marginal, waxing/waning. Mood & affect appropriate.   Data Reviewed: I have personally reviewed following labs and imaging studies  CBC: Recent Labs  Lab 02/07/21 1227 02/09/21 0133  WBC 12.6* 13.1*  NEUTROABS  --  9.9*  HGB 15.4* 14.2  HCT 49.1* 44.8  MCV 82.2 83.6  PLT 167 403   Basic Metabolic Panel: Recent Labs  Lab 02/07/21 1227 02/08/21 0702 02/09/21 0133  NA 141  --  143  K 4.5  --  4.7  CL 107  --  108  CO2 25  --  23  GLUCOSE 160*  --  126*  BUN 16  --  21*  CREATININE 1.32* 1.17* 1.79*  CALCIUM 9.6  --  8.7*   GFR: Estimated Creatinine Clearance: 34.1 mL/min (A) (by C-G formula based on SCr of 1.79 mg/dL (H)). Liver Function Tests: Recent Labs  Lab 02/07/21 1227 02/09/21 0133  AST 55* 30  ALT 74* 46*  ALKPHOS 172* 127*  BILITOT 0.7 0.5  PROT 7.6 6.4*  ALBUMIN 4.1 3.1*   No results for input(s): LIPASE, AMYLASE in the last 168 hours. No results for  input(s): AMMONIA in the last 168 hours. Coagulation Profile: No results for input(s): INR, PROTIME in the last 168 hours. Cardiac Enzymes: No results for input(s): CKTOTAL, CKMB, CKMBINDEX, TROPONINI in the last 168 hours. BNP (last 3 results) No results for input(s): PROBNP in the last 8760 hours. HbA1C: Recent Labs    02/08/21 0702  HGBA1C 5.6   CBG: Recent Labs  Lab 02/08/21 1629 02/08/21 2145 02/09/21 0002 02/09/21 0415 02/09/21 0812  GLUCAP 161* 133* 122* 135* 156*   Lipid Profile: Recent Labs    02/08/21 0702  CHOL 158  HDL 40*  LDLCALC 95  TRIG 117  CHOLHDL 4.0   Thyroid Function Tests: No results for input(s): TSH, T4TOTAL, FREET4, T3FREE, THYROIDAB in the last 72 hours. Anemia Panel: No results for input(s): VITAMINB12, FOLATE, FERRITIN, TIBC, IRON, RETICCTPCT in the last 72 hours. Urine analysis:    Component Value Date/Time   COLORURINE YELLOW (A) 02/07/2021 2229   APPEARANCEUR HAZY (A) 02/07/2021 2229   APPEARANCEUR Clear 12/19/2020 1411   LABSPEC 1.017 02/07/2021 2229   LABSPEC 1.012  12/29/2013 1210   PHURINE 5.0 02/07/2021 2229   GLUCOSEU NEGATIVE 02/07/2021 2229   GLUCOSEU >=500 12/29/2013 1210   HGBUR MODERATE (A) 02/07/2021 2229   HGBUR negative 04/14/2010 1435   BILIRUBINUR NEGATIVE 02/07/2021 2229   BILIRUBINUR Negative 12/19/2020 1411   BILIRUBINUR Negative 12/29/2013 1210   KETONESUR NEGATIVE 02/07/2021 2229   PROTEINUR 100 (A) 02/07/2021 2229   UROBILINOGEN 0.2 04/18/2020 1326   UROBILINOGEN negative 04/14/2010 1435   NITRITE NEGATIVE 02/07/2021 2229   LEUKOCYTESUR SMALL (A) 02/07/2021 2229   LEUKOCYTESUR Negative 12/29/2013 1210   Recent Results (from the past 240 hour(s))  Resp Panel by RT-PCR (Flu A&B, Covid) Nasopharyngeal Swab     Status: None   Collection Time: 02/07/21 10:29 PM   Specimen: Nasopharyngeal Swab; Nasopharyngeal(NP) swabs in vial transport medium  Result Value Ref Range Status   SARS Coronavirus 2 by RT PCR  NEGATIVE NEGATIVE Final    Comment: (NOTE) SARS-CoV-2 target nucleic acids are NOT DETECTED.  The SARS-CoV-2 RNA is generally detectable in upper respiratory specimens during the acute phase of infection. The lowest concentration of SARS-CoV-2 viral copies this assay can detect is 138 copies/mL. A negative result does not preclude SARS-Cov-2 infection and should not be used as the sole basis for treatment or other patient management decisions. A negative result may occur with  improper specimen collection/handling, submission of specimen other than nasopharyngeal swab, presence of viral mutation(s) within the areas targeted by this assay, and inadequate number of viral copies(<138 copies/mL). A negative result must be combined with clinical observations, patient history, and epidemiological information. The expected result is Negative.  Fact Sheet for Patients:  EntrepreneurPulse.com.au  Fact Sheet for Healthcare Providers:  IncredibleEmployment.be  This test is no t yet approved or cleared by the [place] FDA and  has been authorized for detection and/or diagnosis of SARS-CoV-2 by FDA under an Emergency Use Authorization (EUA). This EUA will remain  in effect (meaning this test can be used) for the duration of the COVID-19 declaration under Section 564(b)(1) of the Act, 21 U.S.C.section 360bbb-3(b)(1), unless the authorization is terminated  or revoked sooner.       Influenza A by PCR NEGATIVE NEGATIVE Final   Influenza B by PCR NEGATIVE NEGATIVE Final    Comment: (NOTE) The Xpert Xpress SARS-CoV-2/FLU/RSV plus assay is intended as an aid in the diagnosis of influenza from Nasopharyngeal swab specimens and should not be used as a sole basis for treatment. Nasal washings and aspirates are unacceptable for Xpert Xpress SARS-CoV-2/FLU/RSV testing.  Fact Sheet for Patients: EntrepreneurPulse.com.au  Fact Sheet for  Healthcare Providers: IncredibleEmployment.be  This test is not yet approved or cleared by the [place] FDA and has been authorized for detection and/or diagnosis of SARS-CoV-2 by FDA under an Emergency Use Authorization (EUA). This EUA will remain in effect (meaning this test can be used) for the duration of the COVID-19 declaration under Section 564(b)(1) of the Act, 21 U.S.C. section 360bbb-3(b)(1), unless the authorization is terminated or revoked.  Performed at [facility], [address] [place], [place] [address]   Blood culture (routine x 2)     Status: None (Preliminary result)   Collection Time: 02/07/21 10:29 PM   Specimen: BLOOD  Result Value Ref Range Status   Specimen Description BLOOD RIGHT ANTECUBITAL  Final   Special Requests   Final    BOTTLES DRAWN AEROBIC AND ANAEROBIC Blood Culture results may not be optimal due to an inadequate volume of blood received in  culture bottles   Culture   Final    NO GROWTH 2 DAYS Performed at [facility], [address] [place], [place] [address]    Report Status PENDING  Incomplete  Urine Culture     Status: Abnormal (Preliminary result)   Collection Time: 02/07/21 10:29 PM   Specimen: Urine, Clean Catch  Result Value Ref Range Status   Specimen Description   Final    URINE, CLEAN CATCH Performed at [facility], [address] [place], [place] [address]    Special Requests   Final    NONE Performed at [facility], [address] [place], [place] [address]    Culture (A)  Final    >=100,000 COLONIES/mL GRAM NEGATIVE RODS CULTURE REINCUBATED FOR BETTER GROWTH Performed at [facility], [place] [address], [place], [place] [address]    Report Status PENDING  Incomplete  Culture, blood (Routine X 2) w Reflex to ID Panel     Status: None (Preliminary result)   Collection Time: 02/08/21  3:26 PM   Specimen: BLOOD  Result Value Ref Range Status    Specimen Description BLOOD RIGHT ANTECUBITAL  Final   Special Requests Blood Culture adequate volume  Final   Culture   Final    NO GROWTH < 24 HOURS Performed at [facility], [address] [place], [place] [address]    Report Status PENDING  Incomplete      Radiology Studies: CT ANGIO HEAD NECK W WO CM  Result Date: 02/07/2021 CLINICAL DATA:  Encephalopathy EXAM: CT ANGIOGRAPHY HEAD AND NECK TECHNIQUE: Multidetector CT imaging of the head and neck was performed using the standard protocol during bolus administration of intravenous contrast. Multiplanar CT image reconstructions and MIPs were obtained to evaluate the vascular anatomy. Carotid stenosis measurements (when applicable) are obtained utilizing NASCET criteria, using the distal internal carotid diameter as the denominator. CONTRAST:  61mL OMNIPAQUE IOHEXOL 350 MG/ML SOLN COMPARISON:  12/05/2020 FINDINGS: CT HEAD FINDINGS Brain: There is no mass, hemorrhage or extra-axial collection. The size and configuration of the ventricles and extra-axial CSF spaces are normal. There is an old right basal ganglia small vessel infarct. There is hypoattenuation of the periventricular white matter, most commonly indicating chronic ischemic microangiopathy. Skull: The visualized skull base, calvarium and extracranial soft tissues are normal. Sinuses/Orbits: No fluid levels or advanced mucosal thickening of the visualized paranasal sinuses. No mastoid or middle ear effusion. The orbits are normal. CTA NECK FINDINGS SKELETON: There is no bony spinal canal stenosis. No lytic or blastic lesion. OTHER NECK: Normal pharynx, larynx and major salivary glands. No cervical lymphadenopathy. Unremarkable thyroid gland. UPPER CHEST: Multifocal ground-glass opacity in the right lung apex. AORTIC ARCH: There is calcific atherosclerosis of the aortic arch. There is no aneurysm, dissection or hemodynamically significant stenosis of the visualized portion of the  aorta. There is a right-sided aortic arch with aberrant left subclavian artery, which is surgically occluded. Patent graft between the left common carotid and left subclavian arteries. RIGHT CAROTID SYSTEM: No dissection, occlusion or aneurysm. Mild atherosclerotic calcification at the carotid bifurcation without hemodynamically significant stenosis. LEFT CAROTID SYSTEM: Early bifurcation. No stenosis or other abnormality. VERTEBRAL ARTERIES: Right dominant configuration. Both origins are clearly patent. There is no dissection, occlusion or flow-limiting stenosis to the skull base (V1-V3 segments). CTA HEAD FINDINGS POSTERIOR CIRCULATION: --Vertebral arteries: Bilateral atherosclerotic calcification. --Inferior cerebellar arteries: Normal. --Basilar artery: Normal. --Superior cerebellar arteries: Normal. --Posterior cerebral arteries (PCA): Severe stenosis of the distal  P2 segment of the left PCA, which remains patent distally. The right PCA is predominantly supplied by the posterior communicating artery. ANTERIOR CIRCULATION: --Intracranial internal carotid arteries: Atherosclerotic calcification of the internal carotid arteries at the skull base without hemodynamically significant stenosis. --Anterior cerebral arteries (ACA): Normal. Absent left A1 segment, normal variant --Middle cerebral arteries (MCA): Mild narrowing of the distal left M1 segment. Normal right MCA. VENOUS SINUSES: As permitted by contrast timing, patent. Review of the MIP images confirms the above findings. IMPRESSION: 1. No emergent large vessel occlusion. 2. Unchanged severe stenosis of the distal P2 segment of the left posterior cerebral artery, which remains patent distally. 3. Right-sided aortic arch with aberrant left subclavian artery, which is surgically occluded. Patent graft between the left common carotid and left subclavian arteries. 4. Multifocal ground-glass opacity in the right lung apex, which may indicate pneumonia. Aortic  Atherosclerosis (ICD10-I70.0). Electronically Signed   By: Ulyses Jarred M.D.   On: 02/07/2021 21:19   DG Chest Portable 1 View  Result Date: 02/07/2021 CLINICAL DATA:  Altered level of consciousness, weakness EXAM: PORTABLE CHEST 1 VIEW COMPARISON:  03/31/2020 FINDINGS: Single frontal view of the chest demonstrates an unremarkable cardiac silhouette. No acute airspace disease, effusion, or pneumothorax. No acute bony abnormalities. IMPRESSION: 1. No acute intrathoracic process. Electronically Signed   By: Randa Ngo M.D.   On: 02/07/2021 21:48   EEG adult  Result Date: 02/08/2021 Greta Doom, MD     02/08/2021  7:43 PM History: 51 yo F with altered mental status Sedation: Ativan given during study Technique: This 70 minute video EEG was acquired with electrodes placed according to the International 10-20 electrode system (including Fp1, Fp2, F3, F4, C3, C4, P3, P4, O1, O2, T3, T4, T5, T6, A1, A2, Fz, Cz, Pz). The following electrodes were missing or displaced: none. Background: The background consists of generalized 5 -6 Hz theta activity with superimposed bifrontally predominant sharp wave discharges varying between 2 - 3 Hz, occasionally interspersed with a run of 5-6 hz activity. Following ativan administration, this activity markedly improves, though throughout the study there continues to be occasional bifrontal sharp wave discharges. The EEG improvement was accompanied by clinical improvement with the patient beginning to speak Photic stimulation: Physiologic driving is not performed EEG Abnormalities: 1) Frontal status epilepticus Clinical Interpretation: This EEG recorded evidence of focal frontal status epilepticus  with resolution following ativan administration. [name], MD [facility] [phone] If 7pm- 7am, please page neurology on call as listed in [place].   Overnight EEG with video  Result Date: 02/09/2021 Lora Havens, MD     02/09/2021  9:40 AM  Patient Name: Jalayna Josten MRN: [id number] Epilepsy Attending: Lora Havens Referring Physician/Provider: Dr Donnetta Simpers Duration: 02/08/2021 2347 to 02/09/2021 0930 Patient history: 51 yo F with altered mental status, routine eeg showed status epilepticus. LTM eeg to evaluate for seizure Level of alertness: lethargic AEDs during EEG study: Ativan Technical aspects: This EEG study was done with scalp electrodes positioned according to the 10-20 International system of electrode placement. Electrical activity was acquired at a sampling rate of 500Hz  and reviewed with a high frequency filter of 70Hz  and a low frequency filter of 1Hz . EEG data were recorded continuously and digitally stored. Description: The posterior dominant rhythm consists of 7.5 Hz activity of moderate voltage (25-35 uV) seen predominantly in posterior head regions, symmetric and reactive to eye opening and eye closing. EEG showed continuous generalized 5 to 7 Hz theta slowing.  Intermittent generalized and maximal bifrontal periodic discharges were noted at 2-2.5Hz . Hyperventilation and photic stimulation were not performed.   ABNORMALITY - Periodic epileptiform discharges, generalized and maximal bifrontal( GPDs) - Continuous slow, generalized IMPRESSION: This study showed generalized and maximal bifrontal epileptiform discharges at 2-2.5Hz  which is on the ictal-interictal continuum with high potential for seizure recurrence. Additionally, EEG is suggestive of moderate diffuse encephalopathy, nonspecific etiology. No definite seizures were seen throughout the recording. Priyanka Barbra Sarks    Scheduled Meds:  chlorhexidine  15 mL Mouth Rinse BID   heparin  5,000 Units Subcutaneous Q8H   insulin aspart  0-6 Units Subcutaneous Q4H   levothyroxine  12.5 mcg Intravenous Daily   mouth rinse  15 mL Mouth Rinse q12n4p   Continuous Infusions:  lactated ringers 75 mL/hr at 02/09/21 1200   levETIRAcetam       LOS: 1 day   Patrecia Pour, MD [facility] [email or web address] 02/09/2021, 1:46 PM

## 2021-02-10 ENCOUNTER — Encounter: Payer: Self-pay | Admitting: Oncology

## 2021-02-10 ENCOUNTER — Inpatient Hospital Stay (HOSPITAL_COMMUNITY): Payer: Medicare Other

## 2021-02-10 LAB — COMPREHENSIVE METABOLIC PANEL
ALT: 41 U/L (ref 0–44)
AST: 27 U/L (ref 15–41)
Albumin: 3.2 g/dL — ABNORMAL LOW (ref 3.5–5.0)
Alkaline Phosphatase: 116 U/L (ref 38–126)
Anion gap: 10 (ref 5–15)
BUN: 16 mg/dL (ref 6–20)
CO2: 25 mmol/L (ref 22–32)
Calcium: 8.8 mg/dL — ABNORMAL LOW (ref 8.9–10.3)
Chloride: 102 mmol/L (ref 98–111)
Creatinine, Ser: 1.41 mg/dL — ABNORMAL HIGH (ref 0.44–1.00)
GFR, Estimated: 45 mL/min — ABNORMAL LOW (ref >60)
Glucose, Bld: 135 mg/dL — ABNORMAL HIGH (ref 70–99)
Potassium: 3.6 mmol/L (ref 3.5–5.1)
Sodium: 137 mmol/L (ref 135–145)
Total Bilirubin: 0.4 mg/dL (ref 0.3–1.2)
Total Protein: 6.3 g/dL — ABNORMAL LOW (ref 6.5–8.1)

## 2021-02-10 LAB — GLUCOSE, CAPILLARY
Glucose-Capillary: 104 mg/dL — ABNORMAL HIGH (ref 70–99)
Glucose-Capillary: 108 mg/dL — ABNORMAL HIGH (ref 70–99)
Glucose-Capillary: 113 mg/dL — ABNORMAL HIGH (ref 70–99)
Glucose-Capillary: 127 mg/dL — ABNORMAL HIGH (ref 70–99)
Glucose-Capillary: 138 mg/dL — ABNORMAL HIGH (ref 70–99)
Glucose-Capillary: 163 mg/dL — ABNORMAL HIGH (ref 70–99)
Glucose-Capillary: 99 mg/dL (ref 70–99)

## 2021-02-10 LAB — NOTCH3 CADASIL SEQUENCING

## 2021-02-10 MED ORDER — KETOROLAC TROMETHAMINE 15 MG/ML IJ SOLN: 15.0000 mg | Freq: Once | INTRAMUSCULAR | Status: DC

## 2021-02-10 MED ORDER — CLOPIDOGREL BISULFATE 75 MG PO TABS
75.0000 mg | ORAL_TABLET | Freq: Every day | ORAL | Status: DC
  Administered 2021-02-10 – 2021-02-11 (×2): 75 mg via ORAL
  Filled 2021-02-10 (×2): qty 1

## 2021-02-10 MED ORDER — CARVEDILOL 12.5 MG PO TABS
12.5000 mg | ORAL_TABLET | Freq: Two times a day (BID) | ORAL | Status: DC
  Administered 2021-02-10 (×2): 12.5 mg via ORAL
  Filled 2021-02-10 (×2): qty 1

## 2021-02-10 MED ORDER — TRAZODONE HCL 100 MG PO TABS
100.0000 mg | ORAL_TABLET | Freq: Every evening | ORAL | Status: DC | PRN
  Administered 2021-02-10: 200 mg via ORAL
  Filled 2021-02-10: qty 2

## 2021-02-10 MED ORDER — LEVETIRACETAM 750 MG PO TABS
1500.0000 mg | ORAL_TABLET | Freq: Two times a day (BID) | ORAL | Status: DC
  Administered 2021-02-10 – 2021-02-11 (×2): 1500 mg via ORAL
  Filled 2021-02-10 (×2): qty 2

## 2021-02-10 MED ORDER — LEVOTHYROXINE SODIUM 25 MCG PO TABS
25.0000 ug | ORAL_TABLET | Freq: Every day | ORAL | Status: DC
  Administered 2021-02-10 – 2021-02-11 (×2): 25 ug via ORAL
  Filled 2021-02-10 (×2): qty 1

## 2021-02-10 MED ORDER — GABAPENTIN 300 MG PO CAPS
600.0000 mg | ORAL_CAPSULE | Freq: Two times a day (BID) | ORAL | Status: DC
  Administered 2021-02-10 – 2021-02-11 (×3): 600 mg via ORAL
  Filled 2021-02-10 (×3): qty 2

## 2021-02-10 MED ORDER — ISOSORBIDE MONONITRATE ER 60 MG PO TB24
120.0000 mg | ORAL_TABLET | Freq: Every day | ORAL | Status: DC
  Administered 2021-02-10 – 2021-02-11 (×2): 120 mg via ORAL
  Filled 2021-02-10 (×2): qty 2

## 2021-02-10 MED ORDER — PROCHLORPERAZINE EDISYLATE 10 MG/2ML IJ SOLN
5.0000 mg | Freq: Once | INTRAMUSCULAR | Status: AC
  Administered 2021-02-10: 5 mg via INTRAVENOUS
  Filled 2021-02-10: qty 2

## 2021-02-10 MED ORDER — CARVEDILOL 12.5 MG PO TABS
25.0000 mg | ORAL_TABLET | Freq: Two times a day (BID) | ORAL | Status: DC
  Administered 2021-02-11: 25 mg via ORAL
  Filled 2021-02-10: qty 2

## 2021-02-10 MED ORDER — ASPIRIN EC 81 MG PO TBEC
81.0000 mg | DELAYED_RELEASE_TABLET | Freq: Every day | ORAL | Status: DC
  Administered 2021-02-10 – 2021-02-11 (×2): 81 mg via ORAL
  Filled 2021-02-10 (×2): qty 1

## 2021-02-10 MED ORDER — OXYCODONE HCL 5 MG PO TABS
5.0000 mg | ORAL_TABLET | ORAL | Status: DC | PRN
Start: 2021-02-10 — End: 2021-02-11
  Administered 2021-02-10: 5 mg via ORAL
  Filled 2021-02-10: qty 1

## 2021-02-10 MED ORDER — LIP MEDEX EX OINT
TOPICAL_OINTMENT | CUTANEOUS | Status: DC | PRN
  Filled 2021-02-10: qty 7

## 2021-02-10 MED ORDER — VENLAFAXINE HCL ER 75 MG PO CP24
75.0000 mg | ORAL_CAPSULE | Freq: Every day | ORAL | Status: DC
  Administered 2021-02-10 – 2021-02-11 (×2): 75 mg via ORAL
  Filled 2021-02-10 (×2): qty 1

## 2021-02-10 MED ORDER — SODIUM CHLORIDE 0.9 % IV SOLN: INTRAVENOUS | Status: DC | PRN

## 2021-02-10 MED ORDER — POLYETHYL GLYCOL-PROPYL GLYCOL 0.4-0.3 % OP GEL: Freq: Every day | OPHTHALMIC | Status: DC | PRN

## 2021-02-10 MED ORDER — CHLORHEXIDINE GLUCONATE CLOTH 2 % EX PADS
6.0000 | MEDICATED_PAD | Freq: Every day | CUTANEOUS | Status: DC
  Administered 2021-02-10 – 2021-02-11 (×2): 6 via TOPICAL

## 2021-02-10 MED ORDER — BACLOFEN 5 MG HALF TABLET
5.0000 mg | ORAL_TABLET | Freq: Two times a day (BID) | ORAL | Status: DC | PRN
  Filled 2021-02-10: qty 1

## 2021-02-10 MED ORDER — LEVOTHYROXINE SODIUM 25 MCG PO TABS: 25.0000 ug | ORAL_TABLET | Freq: Every day | ORAL | Status: DC

## 2021-02-10 MED ORDER — POLYVINYL ALCOHOL 1.4 % OP SOLN
1.0000 [drp] | Freq: Every day | OPHTHALMIC | Status: DC | PRN
  Administered 2021-02-10 (×2): 1 [drp] via OPHTHALMIC
  Filled 2021-02-10: qty 15

## 2021-02-10 MED ORDER — PANTOPRAZOLE SODIUM 40 MG PO TBEC
40.0000 mg | DELAYED_RELEASE_TABLET | Freq: Two times a day (BID) | ORAL | Status: DC
  Administered 2021-02-10 – 2021-02-11 (×3): 40 mg via ORAL
  Filled 2021-02-10 (×3): qty 1

## 2021-02-10 MED ORDER — KETOROLAC TROMETHAMINE 15 MG/ML IJ SOLN
7.5000 mg | Freq: Once | INTRAMUSCULAR | Status: AC
  Administered 2021-02-10: 7.5 mg via INTRAVENOUS
  Filled 2021-02-10: qty 1

## 2021-02-10 NOTE — Progress Notes (Signed)
°  Transition of Care [facility]) Screening Note   Patient Details  Name: Jacoria Keiffer Date of Birth: 01-27-1971   Transition of Care [facility]) CM/SW Contact:    Pollie Friar, RN Phone Number: 02/10/2021, 2:44 PM    Transition of Care Department [facility]) has reviewed patient. We will continue to monitor patient advancement through interdisciplinary progression rounds. If new patient transition needs arise, please place a TOC consult.

## 2021-02-10 NOTE — Procedures (Addendum)
Patient Name: Analia Zuk  MRN: [id number]  Epilepsy Attending: Lora Havens  Referring Physician/Provider: Dr Donnetta Simpers Duration: 02/09/2021 2347 to 02/10/2021 1011   Patient history: 51 yo F with altered mental status, routine eeg showed status epilepticus. LTM eeg to evaluate for seizure   Level of alertness: awake, asleep   AEDs during EEG study: LEV, GBP   Technical aspects: This EEG study was done with scalp electrodes positioned according to the 10-20 International system of electrode placement. Electrical activity was acquired at a sampling rate of 500Hz  and reviewed with a high frequency filter of 70Hz  and a low frequency filter of 1Hz . EEG data were recorded continuously and digitally stored.    Description: The posterior dominant rhythm consists of 7.5 Hz activity of moderate voltage (25-35 uV) seen predominantly in posterior head regions, symmetric and reactive to eye opening and eye closing.  Sleep was characterized by vertex waves, sleep spindles (12 to 14 Hz), maximum frontocentral region. Intermittent generalized and maximal bifrontal rhythmic sharply contoured 3 to 2.5 Hz delta activity was also noted.  Hyperventilation and photic stimulation were not performed.      ABNORMALITY - Intermittent rhythmic delta activity, generalized and maximal bifrontal   IMPRESSION: This study is suggestive of mild diffuse encephalopathy, nonspecific etiology. No seizures or definite epileptiform discharges were seen throughout the recording.   Doha Boling Barbra Sarks

## 2021-02-10 NOTE — Progress Notes (Addendum)
Subjective: No seizures overnight. Reports headache and nausea.   ROS: negative except above  Examination  Vital signs in last 24 hours: Temp:  [98.3 F (36.8 C)-98.7 F (37.1 C)] 98.4 F (36.9 C) (01/09 1235) Pulse Rate:  [54-102] 89 (01/09 0752) Resp:  [11-27] 15 (01/09 1235) BP: (129-214)/(70-111) 177/72 (01/09 0752) SpO2:  [96 %-100 %] 97 % (01/09 0752)  General: lying in bed, NAD CVS: pulse-normal rate and rhythm RS: breathing comfortably, CTAB Extremities: warm, no edema  Neuro: MS: Alert, oriented, follows commands CN: pupils equal and reactive,  EOMI, face symmetric, tongue midline, normal sensation over face, Motor: 5/5 strength in all 4 extremities Coordination: normal Gait: not tested  Basic Metabolic Panel: Recent Labs  Lab 02/07/21 1227 02/08/21 0702 02/09/21 0133 02/10/21 0355  NA 141  --  143 137  K 4.5  --  4.7 3.6  CL 107  --  108 102  CO2 25  --  23 25  GLUCOSE 160*  --  126* 135*  BUN 16  --  21* 16  CREATININE 1.32* 1.17* 1.79* 1.41*  CALCIUM 9.6  --  8.7* 8.8*    CBC: Recent Labs  Lab 02/07/21 1227 02/09/21 0133  WBC 12.6* 13.1*  NEUTROABS  --  9.9*  HGB 15.4* 14.2  HCT 49.1* 44.8  MCV 82.2 83.6  PLT 167 156     Coagulation Studies: No results for input(s): LABPROT, INR in the last 72 hours.  Imaging CT angio head and neck 02/07/2021: No large vessel occlusion.  Unchanged severe stenosis of distal P2 segment of left PCA, patent distally.  MRI brain without contrast 12/05/2020: . Small focus of acute ischemia within the right middle cerebellar peduncle. No hemorrhage or mass effect. Moderate chronic small vessel ischemic disease and Cerebral Atrophy    ASSESSMENT AND PLAN: 51 year old female with altered mental status in the setting of previous cognitive disorder as well as UTI, blood pressure 197/96 and status epilepticus on routine EEG.  Acute encephalopathy, resolved Enterobacter UTI Hypertensive  urgency Headache nausea -LTM EEG did not show any seizures overnight - Headache could be due to hypertension, post-ictal headache  Recommendations -DC LTM EEG if no seizures overnight -Continue Keppra 1500 mg twice daily and lacosamide 50 mg twice daily - Will give one time dose of IV toradol and compazine - Will obtain MRI brain wo contrast to assess for acute abnormality -Continue seizure precautions -IV Ativan 2 mg for clinical seizure-like activity -Patient continues to have hypertension.  Will discuss with hospitalist about adjusting antihypertensives -Management of rest of comorbidities per primary team  I have spent a total of  36  minutes with the patient reviewing hospital notes,  test results, labs and examining the patient as well as establishing an assessment and plan that was discussed personally with the patient.  > 50% of time was spent in direct patient care.   Zeb Comfort Epilepsy [facility] For questions after 5pm please refer to AMION to reach the Neurologist on call

## 2021-02-10 NOTE — Progress Notes (Signed)
LTM EEG discontinued - no skin breakdown at unhook.   

## 2021-02-10 NOTE — Evaluation (Signed)
Physical Therapy Evaluation Patient Details Name: [name] MRN: [id number] DOB: 1970/10/15 Today's Date: 02/10/2021  History of Present Illness  [name] is a 51 y.o. female who presented initially to [facility] 1/6 for evaluation of confusion and periods of diminished responsiveness. Routine EEG showed status epilepticus. Pt with a history of demyelinating disease, CVA w/residual left hemiplegia, CAD s/p multiple PCI, stage IIIb CKD, T2DM, HTN, migraine, hypothyroidism  Clinical Impression  Pt admitted with/for periods of confusion and responsiveness post seizure.  Pt needing min guard to light min assist for mobility.  Pt currently limited functionally due to the problems listed below.  (see problems list.)  Pt will benefit from PT to maximize function and safety to be able to get home safely with available assist.        Recommendations for follow up therapy are one component of a multi-disciplinary discharge planning process, led by the attending physician.  Recommendations may be updated based on patient status, additional functional criteria and insurance authorization.  Follow Up Recommendations Home health PT    Assistance Recommended at Discharge Intermittent Supervision/Assistance  Patient can return home with the following  A little help with walking and/or transfers;A little help with bathing/dressing/bathroom;Other (comment) (initially)    Equipment Recommendations    Recommendations for Other Services       Functional Status Assessment Patient has had a recent decline in their functional status and demonstrates the ability to make significant improvements in function in a reasonable and predictable amount of time.     Precautions / Restrictions Precautions Precautions: Fall Precaution Comments: seizures      Mobility  Bed Mobility Overal bed mobility: Needs Assistance Bed Mobility: Supine to Sit;Sit to Supine     Supine to sit: Supervision Sit  to supine: Supervision        Transfers Overall transfer level: Needs assistance Equipment used: Rolling walker (2 wheels) Transfers: Sit to/from Stand Sit to Stand: Min guard           General transfer comment: limited on ambulation distance due to EEG    Ambulation/Gait Ambulation/Gait assistance: Min assist;Min guard Gait Distance (Feet): 300 Feet Assistive device: IV Pole Gait Pattern/deviations: Step-through pattern   Gait velocity interpretation: 1.31 - 2.62 ft/sec, indicative of limited community ambulator   General Gait Details: short steps, mildly unsteady with drift Right if IV pole not directed minimally.  Stairs            Wheelchair Mobility    Modified Rankin (Stroke Patients Only)       Balance Overall balance assessment: Needs assistance Sitting-balance support: Feet supported Sitting balance-Leahy Scale: Good     Standing balance support: Single extremity supported;During functional activity;No upper extremity supported Standing balance-Leahy Scale: Fair                               Pertinent Vitals/Pain Pain Assessment: Faces Faces Pain Scale: Hurts a little bit Pain Location: head Pain Descriptors / Indicators: Headache    Home Living Family/patient expects to be discharged to:: Private residence Living Arrangements: Children (Son) Available Help at Discharge: Available PRN/intermittently;Family Type of Home: House Home Access: Ramped entrance       Home Layout: One level Home Equipment: Rollator (4 wheels);Cane - single point;Shower seat      Prior Function Prior Level of Function : Independent/Modified Independent;History of Falls (last six months)  Mobility Comments: uses a rollator, active wtih HH PT ADLs Comments: uses a shower chiar. Son assists with LB dressing as needed     Hand Dominance   Dominant Hand: Right    Extremity/Trunk Assessment   Upper Extremity Assessment Upper  Extremity Assessment: Generalized weakness LUE Deficits / Details: globally 4/5 MMT, ROM is WFL. denies paresthesias. residual L weakness from prior CVA LUE Sensation: WNL LUE Coordination: decreased fine motor    Lower Extremity Assessment Lower Extremity Assessment: Generalized weakness    Cervical / Trunk Assessment Cervical / Trunk Assessment: Normal  Communication   Communication: No difficulties  Cognition Arousal/Alertness: Awake/alert Behavior During Therapy: WFL for tasks assessed/performed Overall Cognitive Status: Within Functional Limits for tasks assessed                                          General Comments General comments (skin integrity, edema, etc.): vss on RA    Exercises     Assessment/Plan    PT Assessment Patient needs continued PT services  PT Problem List Decreased strength;Decreased activity tolerance;Decreased balance;Decreased mobility;Decreased coordination       PT Treatment Interventions Gait training;Stair training;Functional mobility training;Therapeutic activities;Balance training;Patient/family education;Neuromuscular re-education    PT Goals (Current goals can be found in the Care Plan section)  Acute Rehab PT Goals Patient Stated Goal: Get back to my PLOF and continue HHPT PT Goal Formulation: With patient Time For Goal Achievement: 02/24/21 Potential to Achieve Goals: Good    Frequency Min 3X/week     Co-evaluation               AM-PAC PT "6 Clicks" Mobility  Outcome Measure Help needed turning from your back to your side while in a flat bed without using bedrails?: A Little Help needed moving from lying on your back to sitting on the side of a flat bed without using bedrails?: A Little Help needed moving to and from a bed to a chair (including a wheelchair)?: A Little Help needed standing up from a chair using your arms (e.g., wheelchair or bedside chair)?: A Little Help needed to walk in hospital  room?: A Little Help needed climbing 3-5 steps with a railing? : A Little 6 Click Score: 18    End of Session   Activity Tolerance: Patient tolerated treatment well Patient left: in bed;with call bell/phone within reach;with bed alarm set Nurse Communication: Mobility status PT Visit Diagnosis: Other abnormalities of gait and mobility (R26.89);Muscle weakness (generalized) (M62.81)    Time: 4132-4401 PT Time Calculation (min) (ACUTE ONLY): 30 min   Charges:   PT Evaluation $PT Eval Moderate Complexity: 1 Mod PT Treatments $Gait Training: 8-22 mins        02/10/2021  Jacinto Halim., PT Acute Rehabilitation Services [phone]  (pager) [phone]  (office)  [name] 02/10/2021, 3:47 PM

## 2021-02-10 NOTE — Progress Notes (Signed)
S: Patient is more alert today, oriented to sel  EEG overnight showed continued GPDs with bifrontal predominance intermittently up to 2-2.5 Hz  O:  Vitals:   02/10/21 0230 02/10/21 0300  BP: (!) 152/84 (!) 187/97  Pulse: (!) 102 (!) 101  Resp: 14 14  Temp:    SpO2: 99% 97%   Physical Exam  Constitutional: Appears well-developed and well-nourished.  Psych: Affect appropriate to situation Eyes: No scleral injection HENT: No OP obstruction MSK: no joint deformities.  Cardiovascular: Normal rate and regular rhythm.  Respiratory: Effort normal, non-labored breathing GI: Soft.  No distension. There is no tenderness.  Skin: WDI   Neuro: Mental Status: Patient is awake, alert, and oriented to self Cranial Nerves: II: She blinks to threat bilaterally. Pupils are equal, round, and reactive to light.   III,IV, VI: EOMI without ptosis or diploplia.  V: Facial sensation is symmetric to temperature VII: Facial movement is symmetric.  VIII: hearing is intact to voice X: Uvula elevates symmetrically XI: Shoulder shrug is symmetric. XII: tongue is midline without atrophy or fasciculations.  Motor: She moves extremities relatively symmetrically, no drift. Sensory: She endorses symmetric sensation Cerebellar: No clear ataxia  A/P: 51 yo F with altered mental status in the setting of previous cognitive disorder and UTI, with initial EEG c/f status epilepticus. Overnight she had GPDs intermittently up to 2-2.5 Hz. - Increase keppra to 1500mg  bid - Cefepime switched to ceftriaxone - Seizure precautions - Will continue to follow  Su Monks, MD [facility] [phone]  If 7pm- 7am, please page neurology on call as listed in [place].

## 2021-02-10 NOTE — Progress Notes (Signed)
PROGRESS NOTE  [name]  IRJ:[id number] DOB: 30-Dec-1970 DOA: 02/08/2021 PCP: [name], MD   Brief Narrative: [name] is a 50 y.o. female with a history of demyelinating disease, CVA w/residual left hemiplegia, CAD s/p multiple PCI, stage IIIb CKD, T2DM, HTN, migraine, hypothyroidism who presented initially to [facility] 1/6 for evaluation of confusion and periods of diminished responsiveness. She was afebrile with WBC 12.6k. CXR negative, UA with 21-50WBCs/HPF, rare bacteria. CTA head showed stable, known left PCA (P2) stenosis without acute LVO. BP severely elevated, for which cardene gtt was initiated with ultimate improvement and weaning off. EEG revealed bifrontal status epilepticus terminated with ativan. Continuous EEG was recommended for which she was transferred to [facility] late 1/7. MRI brain is also pending and neurology is following.   Assessment & Plan: Principal Problem:   Status epilepticus ([place]) Active Problems:   Demyelinating disease ([place])   Incomplete bladder emptying   Hypothyroidism   H/O gastric bypass   Hypertensive urgency   Diabetes mellitus without complication (HCC)   History of ESBL E. coli infection  Acute encephalopathy due to seizures:  - Monitor w/Tx as below.  Focal status epilepticus: Improved with ativan. In setting of prior frontal CVA (right middle cerebellar peduncle on MRI 12/05/2020 with moderate chronic small vessel ischemic disease and cerebral atrophy. - Disconnected from cEEG this AM, titrate AEDs per neurology. Continue keppra 1,500mg  IV q12h, prn ativan.  - Hx CVA and demyelinating disease. Will discuss with neurology ?needs MRI brain.  HTN urgency: Improved.  - Continue prn labetalol IV  Demyelinating disease:  - Restart baclofen, gabapentin. Stop tramadol.  - Follow up with Dr. Krista Blue after discharge.   Neurogenic bladder with urinary retention: Pt reports no longer self cathing to reduce risk of infection.  - Bladder  scan consistently showed retention, so foley placed 1/9.  - Follow up with Dr. Matilde Sprang 1/16 at 1:25pm.  UTI, history of ESBL UTI: Empirically given vancomycin and cefepime for concern of recurrent infection, though pt not septic. PCT 0.14, remains afebrile.   - DC vancomycin to reduce renal toxicity. DC cefepime to rule this out as cause of encephalopathy/EEG changes.  - Urine culture now growing Enterococcus (speciation and susceptibility pending). Will continue ceftriaxone (87% local isolates susceptible). Plan on restarting home mabrobid ppx afterward.  History of CVA 2015: Mild L weakness is stable.  - Continue ASA, plavix. Intolerant of atorvastatin.  CAD s/p PCI: No anginal complaints.  - ASA, plavix, coreg, rosuvastatin.   T2DM: History of this, though HbA1c 5.6%.  - Check CBG to r/o hypoglycemia due to NPO status as precipitant.  Stage IIIb CKD: Based on outpatient nephrologist, Dr. Holley Raring. Improving. - Avoid nephrotoxins/DC vancomycin for now. Monitor regularly.    Depression:  - Now that she's having seizures, will not be restarting wellbutrin - Restart trazodone, desvenlafaxine, prn alprazolam  GGO in right lung apex: Seen on CTA of the head and neck. No infiltrates on CXR, no sputum production. Pt had viral-sounding infection 2 weeks ago with cough which may explain opacities. PCT 0.14 not consistent with bacterial pneumonia.  - Incentive spirometry - Aspiration precautions - Monitor off abx.   Hypothyroidism:  -  Continue home low dose synthroid  Nausea: QTC 461msec.  - prn zofran  Abnormal LFTs:  - Monitor  History of bariatric surgery 2011 with anastomotic ulcer, GI bleeding Feb 2022 s/p double-balloon enteroscopy at [facility]. Nonsmoker - Avoid NSAIDs, d/w patient today. - Continue PPI  AOCKD:  stable  Migraines: Quiescent.   DVT prophylaxis: Heparin [place] Code Status: Full Family Communication: Son at bedside Disposition Plan:  Status is:  Inpatient  Remains inpatient appropriate because: AMS, workup of status epilepticus.  Consultants:  Neurology  Procedures:  cEEG  Antimicrobials: Ceftriaxone, azithromycin > vancomycin, cefepime >> ceftriaxone.    Subjective: More alert, but nauseated limiting po intake. Son confirms she's still confused from her baseline. +acute on chronic headaches. No weakness, numbness or other new complaints.   Objective: Vitals:   02/10/21 0600 02/10/21 0630 02/10/21 0752 02/10/21 1235  BP:  (!) 129/94 (!) 177/72   Pulse: 93 95 89   Resp: (!) 27 13 12 15   Temp:   98.6 F (37 C) 98.4 F (36.9 C)  TempSrc:   Oral Oral  SpO2: 98% 99% 97%   Weight:        Intake/Output Summary (Last 24 hours) at 02/10/2021 1329 Last data filed at 02/10/2021 0655 Gross per 24 hour  Intake 1364.43 ml  Output 3165 ml  Net -1800.57 ml   Filed Weights   02/08/21 2131  Weight: 68.2 kg   Gen: Chronically ill-appearing female in no distress Pulm: Nonlabored breathing room air. Clear. CV: Regular rate and rhythm. No murmur, rub, or gallop. No JVD, no dependent edema. GI: Abdomen soft, non-tender, non-distended, with normoactive bowel sounds.  Ext: Warm, no deformities Skin: No new rashes, lesions or ulcers on visualized skin. Glue specks in hair, no skin breakdown noted. Neuro: Alert. Eating. Oriented, though waxes and wanes. No new focal neurological deficits. Psych: Judgement and insight appear marginal. Mood euthymic & affect congruent. Behavior is appropriate.    Data Reviewed: I have personally reviewed following labs and imaging studies  CBC: Recent Labs  Lab 02/07/21 1227 02/09/21 0133  WBC 12.6* 13.1*  NEUTROABS  --  9.9*  HGB 15.4* 14.2  HCT 49.1* 44.8  MCV 82.2 83.6  PLT 167 240   Basic Metabolic Panel: Recent Labs  Lab 02/07/21 1227 02/08/21 0702 02/09/21 0133 02/10/21 0355  NA 141  --  143 137  K 4.5  --  4.7 3.6  CL 107  --  108 102  CO2 25  --  23 25  GLUCOSE 160*  --   126* 135*  BUN 16  --  21* 16  CREATININE 1.32* 1.17* 1.79* 1.41*  CALCIUM 9.6  --  8.7* 8.8*   GFR: Estimated Creatinine Clearance: 43.3 mL/min (A) (by C-G formula based on SCr of 1.41 mg/dL (H)). Liver Function Tests: Recent Labs  Lab 02/07/21 1227 02/09/21 0133 02/10/21 0355  AST 55* 30 27  ALT 74* 46* 41  ALKPHOS 172* 127* 116  BILITOT 0.7 0.5 0.4  PROT 7.6 6.4* 6.3*  ALBUMIN 4.1 3.1* 3.2*   No results for input(s): LIPASE, AMYLASE in the last 168 hours. No results for input(s): AMMONIA in the last 168 hours. Coagulation Profile: No results for input(s): INR, PROTIME in the last 168 hours. Cardiac Enzymes: No results for input(s): CKTOTAL, CKMB, CKMBINDEX, TROPONINI in the last 168 hours. BNP (last 3 results) No results for input(s): PROBNP in the last 8760 hours. HbA1C: Recent Labs    02/08/21 0702  HGBA1C 5.6   CBG: Recent Labs  Lab 02/09/21 2041 02/10/21 0003 02/10/21 0408 02/10/21 0806 02/10/21 1154  GLUCAP 163* 99 127* 138* 163*   Lipid Profile: Recent Labs    02/08/21 0702  CHOL 158  HDL 40*  LDLCALC 95  TRIG 117  CHOLHDL 4.0  Thyroid Function Tests: No results for input(s): TSH, T4TOTAL, FREET4, T3FREE, THYROIDAB in the last 72 hours. Anemia Panel: No results for input(s): VITAMINB12, FOLATE, FERRITIN, TIBC, IRON, RETICCTPCT in the last 72 hours. Urine analysis:    Component Value Date/Time   COLORURINE YELLOW (A) 02/07/2021 2229   APPEARANCEUR HAZY (A) 02/07/2021 2229   APPEARANCEUR Clear 12/19/2020 1411   LABSPEC 1.017 02/07/2021 2229   LABSPEC 1.012 12/29/2013 1210   PHURINE 5.0 02/07/2021 2229   GLUCOSEU NEGATIVE 02/07/2021 2229   GLUCOSEU >=500 12/29/2013 1210   HGBUR MODERATE (A) 02/07/2021 2229   HGBUR negative 04/14/2010 1435   BILIRUBINUR NEGATIVE 02/07/2021 2229   BILIRUBINUR Negative 12/19/2020 1411   BILIRUBINUR Negative 12/29/2013 1210   KETONESUR NEGATIVE 02/07/2021 2229   PROTEINUR 100 (A) 02/07/2021 2229    UROBILINOGEN 0.2 04/18/2020 1326   UROBILINOGEN negative 04/14/2010 1435   NITRITE NEGATIVE 02/07/2021 2229   LEUKOCYTESUR SMALL (A) 02/07/2021 2229   LEUKOCYTESUR Negative 12/29/2013 1210   Recent Results (from the past 240 hour(s))  Resp Panel by RT-PCR (Flu A&B, Covid) Nasopharyngeal Swab     Status: None   Collection Time: 02/07/21 10:29 PM   Specimen: Nasopharyngeal Swab; Nasopharyngeal(NP) swabs in vial transport medium  Result Value Ref Range Status   SARS Coronavirus 2 by RT PCR NEGATIVE NEGATIVE Final    Comment: (NOTE) SARS-CoV-2 target nucleic acids are NOT DETECTED.  The SARS-CoV-2 RNA is generally detectable in upper respiratory specimens during the acute phase of infection. The lowest concentration of SARS-CoV-2 viral copies this assay can detect is 138 copies/mL. A negative result does not preclude SARS-Cov-2 infection and should not be used as the sole basis for treatment or other patient management decisions. A negative result may occur with  improper specimen collection/handling, submission of specimen other than nasopharyngeal swab, presence of viral mutation(s) within the areas targeted by this assay, and inadequate number of viral copies(<138 copies/mL). A negative result must be combined with clinical observations, patient history, and epidemiological information. The expected result is Negative.  Fact Sheet for Patients:  EntrepreneurPulse.com.au  Fact Sheet for Healthcare Providers:  IncredibleEmployment.be  This test is no t yet approved or cleared by the [place] FDA and  has been authorized for detection and/or diagnosis of SARS-CoV-2 by FDA under an Emergency Use Authorization (EUA). This EUA will remain  in effect (meaning this test can be used) for the duration of the COVID-19 declaration under Section 564(b)(1) of the Act, 21 U.S.C.section 360bbb-3(b)(1), unless the authorization is terminated  or revoked  sooner.       Influenza A by PCR NEGATIVE NEGATIVE Final   Influenza B by PCR NEGATIVE NEGATIVE Final    Comment: (NOTE) The Xpert Xpress SARS-CoV-2/FLU/RSV plus assay is intended as an aid in the diagnosis of influenza from Nasopharyngeal swab specimens and should not be used as a sole basis for treatment. Nasal washings and aspirates are unacceptable for Xpert Xpress SARS-CoV-2/FLU/RSV testing.  Fact Sheet for Patients: EntrepreneurPulse.com.au  Fact Sheet for Healthcare Providers: IncredibleEmployment.be  This test is not yet approved or cleared by the [place] FDA and has been authorized for detection and/or diagnosis of SARS-CoV-2 by FDA under an Emergency Use Authorization (EUA). This EUA will remain in effect (meaning this test can be used) for the duration of the COVID-19 declaration under Section 564(b)(1) of the Act, 21 U.S.C. section 360bbb-3(b)(1), unless the authorization is terminated or revoked.  Performed at [facility], [address] [place], [place] [address]  Blood culture (routine x 2)     Status: None (Preliminary result)   Collection Time: 02/07/21 10:29 PM   Specimen: BLOOD  Result Value Ref Range Status   Specimen Description BLOOD RIGHT ANTECUBITAL  Final   Special Requests   Final    BOTTLES DRAWN AEROBIC AND ANAEROBIC Blood Culture results may not be optimal due to an inadequate volume of blood received in culture bottles   Culture   Final    NO GROWTH 3 DAYS Performed at [facility], [address] [place], [place] [address]    Report Status PENDING  Incomplete  Urine Culture     Status: Abnormal (Preliminary result)   Collection Time: 02/07/21 10:29 PM   Specimen: Urine, Clean Catch  Result Value Ref Range Status   Specimen Description   Final    URINE, CLEAN CATCH Performed at [facility], [address] [place], [place] [address]    Special Requests   Final     NONE Performed at [facility], [address], [place], [place] [address]    Culture (A)  Final    >=100,000 COLONIES/mL ENTEROBACTER SPECIES CULTURE REINCUBATED FOR BETTER GROWTH Performed at [facility], [place] [address] [place], [place] [address]    Report Status PENDING  Incomplete  Culture, blood (Routine X 2) w Reflex to ID Panel     Status: None (Preliminary result)   Collection Time: 2021-02-16  3:26 PM   Specimen: BLOOD  Result Value Ref Range Status   Specimen Description BLOOD RIGHT ANTECUBITAL  Final   Special Requests Blood Culture adequate volume  Final   Culture   Final    NO GROWTH 2 DAYS Performed at [facility], [address] [place], [place] [address]    Report Status PENDING  Incomplete      Radiology Studies: EEG adult  Result Date: February 16, 2021 Greta Doom, MD     Feb 16, 2021  7:43 PM History: 51 yo F with altered mental status Sedation: Ativan given during study Technique: This 70 minute video EEG was acquired with electrodes placed according to the International 10-20 electrode system (including Fp1, Fp2, F3, F4, C3, C4, P3, P4, O1, O2, T3, T4, T5, T6, A1, A2, Fz, Cz, Pz). The following electrodes were missing or displaced: none. Background: The background consists of generalized 5 -6 Hz theta activity with superimposed bifrontally predominant sharp wave discharges varying between 2 - 3 Hz, occasionally interspersed with a run of 5-6 hz activity. Following ativan administration, this activity markedly improves, though throughout the study there continues to be occasional bifrontal sharp wave discharges. The EEG improvement was accompanied by clinical improvement with the patient beginning to speak Photic stimulation: Physiologic driving is not performed EEG Abnormalities: 1) Frontal status epilepticus Clinical Interpretation: This EEG recorded evidence of focal frontal status epilepticus  with resolution following ativan  administration. [name], MD [facility] [phone] If 7pm- 7am, please page neurology on call as listed in [place].   Overnight EEG with video  Result Date: 02/09/2021 Lora Havens, MD     02/10/2021  9:51 AM Patient Name: Shantoya Geurts MRN: [id number] Epilepsy Attending: Lora Havens Referring Physician/Provider: Dr Donnetta Simpers Duration: 02/16/21 2347 to 02/09/2021 2347 Patient history: 51 yo F with altered mental status, routine eeg showed status epilepticus. LTM eeg to evaluate for seizure Level of alertness: lethargic AEDs during EEG study: Ativan Technical aspects: This EEG study was done with scalp electrodes positioned  according to the 10-20 International system of electrode placement. Electrical activity was acquired at a sampling rate of 500Hz  and reviewed with a high frequency filter of 70Hz  and a low frequency filter of 1Hz . EEG data were recorded continuously and digitally stored. Description: The posterior dominant rhythm consists of 7.5 Hz activity of moderate voltage (25-35 uV) seen predominantly in posterior head regions, symmetric and reactive to eye opening and eye closing. EEG showed continuous generalized 5 to 7 Hz theta slowing. Intermittent generalized and maximal bifrontal periodic discharges were noted at 2-2.5Hz . Hyperventilation and photic stimulation were not performed.   ABNORMALITY - Periodic epileptiform discharges, generalized and maximal bifrontal( GPDs) - Continuous slow, generalized IMPRESSION: This study showed generalized and maximal bifrontal epileptiform discharges at 2-2.5Hz  which is on the ictal-interictal continuum with high potential for seizure recurrence. Additionally, EEG is suggestive of moderate diffuse encephalopathy, nonspecific etiology. No definite seizures were seen throughout the recording. Priyanka Barbra Sarks    Scheduled Meds:  aspirin EC  81 mg Oral Daily   carvedilol  12.5 mg Oral BID WC   chlorhexidine  15 mL  Mouth Rinse BID   Chlorhexidine Gluconate Cloth  6 each Topical Daily   clopidogrel  75 mg Oral Daily   gabapentin  600 mg Oral BID   heparin  5,000 Units Subcutaneous Q8H   insulin aspart  0-6 Units Subcutaneous Q4H   isosorbide mononitrate  120 mg Oral Daily   lacosamide  50 mg Oral BID   levETIRAcetam  1,500 mg Oral BID   levothyroxine  25 mcg Oral Q0600   mouth rinse  15 mL Mouth Rinse q12n4p   pantoprazole  40 mg Oral BID   venlafaxine XR  75 mg Oral Q breakfast   Continuous Infusions:  sodium chloride     cefTRIAXone (ROCEPHIN)  IV Stopped (02/09/21 1647)     LOS: 2 days   Patrecia Pour, MD [facility] [email or web address] 02/10/2021, 1:29 PM

## 2021-02-10 NOTE — Evaluation (Signed)
Occupational Therapy Evaluation Patient Details Name: [name] MRN: [id number] DOB: [birth year] Today's Date: 02/10/2021   History of Present Illness [name] is a 51 y.o. female who presented initially to [facility] 1/6 for evaluation of confusion and periods of diminished responsiveness. Routine EEG showed status epilepticus. Pt with a history of demyelinating disease, CVA w/residual left hemiplegia, CAD s/p multiple PCI, stage IIIb CKD, T2DM, HTN, migraine, hypothyroidism   Clinical Impression   [name] was generally mod I PTA with use of a rollator and DME, however she states her son had to assist her with LB dressing intermittently. She is currently activate with HH PT. She lives in a 1 level home with her son who works during the day. Upon evaluation , pt required supervision for bed mobility, min guard for transfers and short ambulation with RW, and up to min A for ADLs. Session limited to EEG monitor. Pt will benefit from continued OT acutely. Recommend d/c to home with [facility] therapy.      Recommendations for follow up therapy are one component of a multi-disciplinary discharge planning process, led by the attending physician.  Recommendations may be updated based on patient status, additional functional criteria and insurance authorization.   Follow Up Recommendations  Home health OT    Assistance Recommended at Discharge Intermittent Supervision/Assistance  Patient can return home with the following A little help with walking and/or transfers;A little help with bathing/dressing/bathroom;Assistance with cooking/housework;Help with stairs or ramp for entrance;Assist for transportation    Functional Status Assessment  Patient has had a recent decline in their functional status and demonstrates the ability to make significant improvements in function in a reasonable and predictable amount of time.  Equipment Recommendations  None recommended by OT    Recommendations for  Other Services       Precautions / Restrictions Precautions Precautions: Fall Precaution Comments: seizures Restrictions Weight Bearing Restrictions: No      Mobility Bed Mobility Overal bed mobility: Needs Assistance Bed Mobility: Supine to Sit;Sit to Supine     Supine to sit: Supervision Sit to supine: Supervision        Transfers Overall transfer level: Needs assistance Equipment used: Rolling walker (2 wheels) Transfers: Sit to/from Stand Sit to Stand: Min guard           General transfer comment: limited on ambulation distance due to EEG      Balance Overall balance assessment: Needs assistance Sitting-balance support: Feet supported Sitting balance-Leahy Scale: Good     Standing balance support: Single extremity supported;During functional activity Standing balance-Leahy Scale: Fair                             ADL either performed or assessed with clinical judgement   ADL Overall ADL's : Needs assistance/impaired Eating/Feeding: Independent;Sitting   Grooming: Min guard;Standing   Upper Body Bathing: Set up;Sitting   Lower Body Bathing: Minimal assistance;Sit to/from stand   Upper Body Dressing : Set up;Sitting   Lower Body Dressing: Minimal assistance   Toilet Transfer: Min guard;Ambulation;Rolling walker (2 wheels)   Toileting- Clothing Manipulation and Hygiene: Supervision/safety;Sitting/lateral lean       Functional mobility during ADLs: Rolling walker (2 wheels);Min guard General ADL Comments: assistance needed for residual L weakness, poor activity tolerance and balance and safety.     Vision Baseline Vision/History: 1 Wears glasses Ability to See in Adequate Light: 0 Adequate Patient Visual Report: No change from baseline  Vision Assessment?: No apparent visual deficits            Pertinent Vitals/Pain Pain Assessment: Faces Pain Score: 2  Pain Location: head Pain Descriptors / Indicators: Headache Pain  Intervention(s): Monitored during session     Hand Dominance Right   Extremity/Trunk Assessment Upper Extremity Assessment Upper Extremity Assessment: Generalized weakness;LUE deficits/detail LUE Deficits / Details: globally 4/5 MMT, ROM is WFL. denies paresthesias. residual L weakness from prior CVA LUE Sensation: WNL LUE Coordination: decreased fine motor   Lower Extremity Assessment Lower Extremity Assessment: Defer to PT evaluation   Cervical / Trunk Assessment Cervical / Trunk Assessment: Normal   Communication Communication Communication: No difficulties   Cognition Arousal/Alertness: Awake/alert Behavior During Therapy: WFL for tasks assessed/performed Overall Cognitive Status: Within Functional Limits for tasks assessed         General Comments  VSS on RA, all leads intact            Home Living Family/patient expects to be discharged to:: Private residence Living Arrangements: Children (Son) Available Help at Discharge: Available PRN/intermittently;Family Type of Home: House Home Access: Ramped entrance     Home Layout: One level     Bathroom Shower/Tub: Tub/shower unit         Home Equipment: Rollator (4 wheels);Cane - single point;Shower seat          Prior Functioning/Environment Prior Level of Function : Independent/Modified Independent;History of Falls (last six months)             Mobility Comments: uses a rollator, active wtih HH PT ADLs Comments: uses a shower chiar. Son assists with LB dressing as needed        OT Problem List: Decreased strength;Decreased range of motion;Decreased activity tolerance;Impaired balance (sitting and/or standing);Decreased coordination;Decreased safety awareness;Decreased knowledge of use of DME or AE;Decreased knowledge of precautions;Pain      OT Treatment/Interventions: Self-care/ADL training;Therapeutic exercise;DME and/or AE instruction;Therapeutic activities;Patient/family education;Balance  training    OT Goals(Current goals can be found in the care plan section) Acute Rehab OT Goals Patient Stated Goal: go home soon OT Goal Formulation: With patient Time For Goal Achievement: 02/24/21 Potential to Achieve Goals: Good ADL Goals Pt Will Perform Grooming: with modified independence;standing Pt Will Perform Lower Body Bathing: with modified independence;sit to/from stand Pt Will Perform Lower Body Dressing: with modified independence;sit to/from stand Pt Will Transfer to Toilet: with modified independence;ambulating Pt Will Perform Tub/Shower Transfer: with supervision;ambulating  OT Frequency: Min 2X/week       AM-PAC OT "6 Clicks" Daily Activity     Outcome Measure Help from another person eating meals?: None Help from another person taking care of personal grooming?: A Little Help from another person toileting, which includes using toliet, bedpan, or urinal?: A Little Help from another person bathing (including washing, rinsing, drying)?: A Little Help from another person to put on and taking off regular upper body clothing?: None Help from another person to put on and taking off regular lower body clothing?: A Little 6 Click Score: 20   End of Session Equipment Utilized During Treatment: Gait belt;Rolling walker (2 wheels) Nurse Communication: Mobility status  Activity Tolerance: Patient tolerated treatment well Patient left: in bed;with call bell/phone within reach;with bed alarm set  OT Visit Diagnosis: Unsteadiness on feet (R26.81);Other abnormalities of gait and mobility (R26.89);Muscle weakness (generalized) (M62.81);History of falling (Z91.81);Pain                Time: 1610-9604 OT Time Calculation (min): 14 min Charges:  OT General Charges $OT Visit: 1 Visit OT Evaluation $OT Eval Moderate Complexity: 1 Mod   Hisao Doo A Shermeka Rutt 02/10/2021, 10:11 AM

## 2021-02-10 NOTE — Progress Notes (Signed)
Patient called this RN into the room to search for [employer] Landscape architect. This RN advised patient that her Cellphone charger has been in her possession but not a Landscape architect. Patient states that her son brought in this charger, she is very upset that it is not in the room. She requested to speak with house supervisor, Chief Financial Officer and campus security. While this RN was in the room, the patient called her son, [name]. [name] stated "mom your charger is at home"  [place] has been added to patient belonging assessment.

## 2021-02-11 ENCOUNTER — Other Ambulatory Visit: Payer: Self-pay

## 2021-02-11 ENCOUNTER — Other Ambulatory Visit (HOSPITAL_COMMUNITY): Payer: Self-pay

## 2021-02-11 ENCOUNTER — Telehealth: Payer: Self-pay | Admitting: Neurology

## 2021-02-11 ENCOUNTER — Encounter: Payer: Self-pay | Admitting: Oncology

## 2021-02-11 LAB — CBC
HCT: 39.3 % (ref 36.0–46.0)
Hemoglobin: 12.8 g/dL (ref 12.0–15.0)
MCH: 26.7 pg (ref 26.0–34.0)
MCHC: 32.6 g/dL (ref 30.0–36.0)
MCV: 82 fL (ref 80.0–100.0)
Platelets: 114 10*3/uL — ABNORMAL LOW (ref 150–400)
RBC: 4.79 MIL/uL (ref 3.87–5.11)
RDW: 14.6 % (ref 11.5–15.5)
WBC: 7.2 10*3/uL (ref 4.0–10.5)
nRBC: 0 % (ref 0.0–0.2)

## 2021-02-11 LAB — BASIC METABOLIC PANEL
Anion gap: 8 (ref 5–15)
BUN: 17 mg/dL (ref 6–20)
CO2: 26 mmol/L (ref 22–32)
Calcium: 8.7 mg/dL — ABNORMAL LOW (ref 8.9–10.3)
Chloride: 101 mmol/L (ref 98–111)
Creatinine, Ser: 1.65 mg/dL — ABNORMAL HIGH (ref 0.44–1.00)
GFR, Estimated: 37 mL/min — ABNORMAL LOW (ref >60)
Glucose, Bld: 129 mg/dL — ABNORMAL HIGH (ref 70–99)
Potassium: 3.7 mmol/L (ref 3.5–5.1)
Sodium: 135 mmol/L (ref 135–145)

## 2021-02-11 LAB — URINE CULTURE: Culture: >=100000 — AB

## 2021-02-11 LAB — GLUCOSE, CAPILLARY
Glucose-Capillary: 92 mg/dL (ref 70–99)
Glucose-Capillary: 94 mg/dL (ref 70–99)
Glucose-Capillary: 145 mg/dL — ABNORMAL HIGH (ref 70–99)
Glucose-Capillary: 203 mg/dL — ABNORMAL HIGH (ref 70–99)

## 2021-02-11 MED ORDER — LACOSAMIDE 50 MG PO TABS
50.0000 mg | ORAL_TABLET | Freq: Two times a day (BID) | ORAL | 0 refills | Status: DC
  Filled 2021-02-11: qty 60, 30d supply, fill #0

## 2021-02-11 MED ORDER — HYDRALAZINE HCL 50 MG PO TABS
50.0000 mg | ORAL_TABLET | Freq: Three times a day (TID) | ORAL | 0 refills | Status: DC
  Filled 2021-02-11: qty 90, 30d supply, fill #0

## 2021-02-11 MED ORDER — LEVETIRACETAM 750 MG PO TABS
1500.0000 mg | ORAL_TABLET | Freq: Two times a day (BID) | ORAL | 0 refills | Status: DC
  Filled 2021-02-11: qty 120, 30d supply, fill #0

## 2021-02-11 MED ORDER — HYDRALAZINE HCL 25 MG PO TABS
25.0000 mg | ORAL_TABLET | Freq: Four times a day (QID) | ORAL | Status: DC
  Administered 2021-02-11 (×2): 25 mg via ORAL
  Filled 2021-02-11 (×2): qty 1

## 2021-02-11 NOTE — Telephone Encounter (Signed)
Left detailed message.   

## 2021-02-11 NOTE — Progress Notes (Signed)
Pt's foley was removed before DC, pt will continue her In and Out cath 3-4x a day at home.  AVS was given and explained to pt, son was included in the teaching. TOC delivered her home meds. No further questions.

## 2021-02-11 NOTE — TOC Transition Note (Signed)
Transition of Care [facility]) - CM/SW Discharge Note   Patient Details  Name: [name] MRN: [id number] Date of Birth: 06-Sep-1970  Transition of Care [facility]) CM/SW Contact:  Pollie Friar, RN Phone Number: 02/11/2021, 11:56 AM   Clinical Narrative:    Patient is discharging home with resumption of home health services through [place]. CM has contacted the [facility] branch about resumption orders.  Pt has needed DME at home: walker, rollator, cane Lacosamide for home has $0 co pay.  Pt has needed transportation at home and to home today.    Final next level of care: Home w Home Health Services Barriers to Discharge: No Barriers Identified   Patient Goals and CMS Choice   CMS Medicare.gov Compare Post Acute Care list provided to:: Patient Choice offered to / list presented to : Patient  Discharge Placement                       Discharge Plan and Services                          HH Arranged: PT, OT [facility] Agency: [facility] Date [place]: 02/11/21   Representative spoke with at [place]: Left Voicemail for Amy at [facility] [phone]  Social Determinants of Health ([place]) Interventions     Readmission Risk Interventions Readmission Risk Prevention Plan 11/07/2019 10/18/2019 09/05/2019  Transportation Screening Complete Complete Complete  Medication Review Press photographer) Complete Complete Complete  PCP or Specialist appointment within 3-5 days of discharge (No Data) Complete Complete  HRI or Home Care Consult Complete Complete -  SW Recovery Care/Counseling Consult - Complete Complete  SW Consult Not Complete Comments - - -  Palliative Care Screening Not Applicable Not Applicable Not [place] Not Applicable Not Applicable Not Applicable  Some recent data might be hidden

## 2021-02-11 NOTE — Telephone Encounter (Signed)
Please call patient, laboratory evaluations continue to show mild elevated  Anicardiolipin Ig M antibody, indeterminate, level 28  Elevated homocysteine 27, she may benefit B complex supplement,  Negative NOTCH 3,  Recent hospital admission for status epilepticus,   Will keep follow-up with me in March

## 2021-02-11 NOTE — TOC Benefit Eligibility Note (Addendum)
Patient Teacher, English as a foreign language completed.    The patient is currently admitted and upon discharge could be taking lacosamide (Vimpart) 50 mg.  The current 30 day co-pay is, $0.00.   The patient is insured through [employer] [place] Patient Advocate Specialist [place] Patient Advocate Team Direct Number: [phone]  Fax: [phone]

## 2021-02-11 NOTE — Progress Notes (Signed)
Subjective: No seizures overnight  ROS: negative except above  Examination  Vital signs in last 24 hours: Temp:  [98 F (36.7 C)-99.1 F (37.3 C)] 99.1 F (37.3 C) (01/10 0748) Pulse Rate:  [62-78] 75 (01/10 0828) Resp:  [13-20] 13 (01/10 0800) BP: (144-197)/(64-105) 149/64 (01/10 0748) SpO2:  [95 %-99 %] 97 % (01/10 0800)  General: lying in bed, NAD CVS: pulse-normal rate and rhythm RS: breathing comfortably, CTAB Extremities: warm, no edema   Neuro: MS: Alert, oriented, follows commands CN: pupils equal and reactive,  EOMI, face symmetric, tongue midline, normal sensation over face, Motor: 5/5 strength in all 4 extremities Coordination: normal Gait: not tested    Basic Metabolic Panel: Recent Labs  Lab 02/07/21 1227 02/08/21 0702 02/09/21 0133 02/10/21 0355 02/11/21 0826  NA 141  --  143 137 135  K 4.5  --  4.7 3.6 3.7  CL 107  --  108 102 101  CO2 25  --  23 25 26   GLUCOSE 160*  --  126* 135* 129*  BUN 16  --  21* 16 17  CREATININE 1.32* 1.17* 1.79* 1.41* 1.65*  CALCIUM 9.6  --  8.7* 8.8* 8.7*    CBC: Recent Labs  Lab 02/07/21 1227 02/09/21 0133  WBC 12.6* 13.1*  NEUTROABS  --  9.9*  HGB 15.4* 14.2  HCT 49.1* 44.8  MCV 82.2 83.6  PLT 167 156     Coagulation Studies: No results for input(s): LABPROT, INR in the last 72 hours.  Imaging MRI brain without contrast 02/11/2021: 1. 1.6 cm focus of curvilinear diffusion abnormality involving the deep white matter of the posterior left frontal centrum semi ovale as above. While this could potentially reflect a small focus of artifact/T2 shine through, a possible evolving subacute small vessel ischemic infarct could be considered in the correct clinical setting. 2. No other acute intracranial abnormality. 3. Age-related cerebral atrophy with advanced chronic microvascular ischemic disease, with multiple remote lacunar infarcts as above.   ASSESSMENT AND PLAN:  51 year old female with altered mental  status in the setting of previous cognitive disorder as well as UTI, blood pressure 197/96 and status epilepticus on routine EEG.   Acute encephalopathy, resolved Enterobacter UTI Hypertensive urgency Headache nausea -MRI brain personally reviewed, - Headache could be due to hypertension, post-ictal headache  Recommendations -Continue Keppra 1500 mg twice daily and lacosamide 50 mg twice daily -Continue seizure precautions -IV Ativan 2 mg for clinical seizure-like activity -Management of rest of comorbidities per primary team -Follow-up with Dr. Krista Blue at [facility] on 04/21/2021 at 0130 pm   Seizure precautions: Per [facility] statutes, patients with seizures are not allowed to drive until they have been seizure-free for six months and cleared by a physician    Use caution when using heavy equipment or power tools. Avoid working on ladders or at heights. Take showers instead of baths. Ensure the water temperature is not too high on the home water heater. Do not go swimming alone. Do not lock yourself in a room alone (i.e. bathroom). When caring for infants or small children, sit down when holding, feeding, or changing them to minimize risk of injury to the child in the event you have a seizure. Maintain good sleep hygiene. Avoid alcohol.    If patient has another seizure, call 911 and bring them back to the ED if: A.  The seizure lasts longer than 5 minutes.      B.  The patient doesn't wake shortly after the  seizure or has new problems such as difficulty seeing, speaking or moving following the seizure C.  The patient was injured during the seizure D.  The patient has a temperature over 102 F (39C) E.  The patient vomited during the seizure and now is having trouble breathing    During the Seizure   - First, ensure adequate ventilation and place patients on the floor on their left side  Loosen clothing around the neck and ensure the airway is patent. If the patient is clenching the  teeth, do not force the mouth open with any object as this can cause severe damage - Remove all items from the surrounding that can be hazardous. The patient may be oblivious to what's happening and may not even know what he or she is doing. If the patient is confused and wandering, either gently guide him/her away and block access to outside areas - Reassure the individual and be comforting - Call 911. In most cases, the seizure ends before EMS arrives. However, there are cases when seizures may last over 3 to 5 minutes. Or the individual may have developed breathing difficulties or severe injuries. If a pregnant patient or a person with diabetes develops a seizure, it is prudent to call an ambulance. - Finally, if the patient does not regain full consciousness, then call EMS. Most patients will remain confused for about 45 to 90 minutes after a seizure, so you must use judgment in calling for help. - Avoid restraints but make sure the patient is in a bed with padded side rails - Place the individual in a lateral position with the neck slightly flexed; this will help the saliva drain from the mouth and prevent the tongue from falling backward - Remove all nearby furniture and other hazards from the area - Provide verbal assurance as the individual is regaining consciousness - Provide the patient with privacy if possible - Call for help and start treatment as ordered by the caregiver    After the Seizure (Postictal Stage)   After a seizure, most patients experience confusion, fatigue, muscle pain and/or a headache. Thus, one should permit the individual to sleep. For the next few days, reassurance is essential. Being calm and helping reorient the person is also of importance.   Most seizures are painless and end spontaneously. Seizures are not harmful to others but can lead to complications such as stress on the lungs, brain and the heart. Individuals with prior lung problems may develop labored  breathing and respiratory distress.      I have spent a total of 27 minutes with the patient reviewing hospital notes,  test results, labs and examining the patient as well as establishing an assessment and plan that was discussed personally with the patient.  > 50% of time was spent in direct patient care.   Zeb [facility] Epilepsy [facility] For questions after 5pm please refer to AMION to reach the Neurologist on call

## 2021-02-11 NOTE — Discharge Summary (Signed)
Physician Discharge Summary  [facility]:[id number] DOB: November 28, 1970 DOA: 02/08/2021  PCP: [name], MD  Admit date: 02/08/2021 Discharge date: 02/11/2021  Admitted From: Home Disposition: Home   Recommendations for Outpatient Follow-up:  Follow up with PCP in 1-2 weeks Follow up with neurology as scheduled. Follow up with cardiology. Hydralazine added for improved BP control.  Follow up with urology. Pt advised to continue self caths.  Home Health: Resume PT, OT Equipment/Devices: None new Discharge Condition: Stable CODE STATUS: Full Diet recommendation: Heart healthy  Brief/Interim Summary: [name] is a 51 y.o. female with a history of demyelinating disease, CVA w/residual left hemiplegia, CAD s/p multiple PCI, stage IIIb CKD, T2DM, HTN, migraine, hypothyroidism who presented initially to [name] 1/6 for evaluation of confusion and periods of diminished responsiveness. She was afebrile with WBC 12.6k. CXR negative, UA with 21-50WBCs/HPF, rare bacteria. CTA head showed stable, known left PCA (P2) stenosis without acute LVO. BP severely elevated, for which cardene gtt was initiated with ultimate improvement and weaning off. EEG revealed bifrontal status epilepticus terminated with ativan. Continuous EEG was recommended for which she was transferred to [facility] late 1/7. MRI brain showing no new findings (d/w neurology that abnormality is T2 shine through). With AED titration, seizures resolved. Blood pressure regulation improved with restarting home medications and starting hydralazine. UTI was treated while admitted. The patient's mental status has improved and she is cleared for discharge.   Discharge Diagnoses:  Principal Problem:   Status epilepticus ([place]) Active Problems:   Demyelinating disease ([place])   Incomplete bladder emptying   Hypothyroidism   H/O gastric bypass   Hypertensive urgency   Diabetes mellitus without complication (HCC)   History of ESBL  E. coli infection  Acute encephalopathy due to seizures: Improved. - Monitor w/Tx as below.   Focal status epilepticus: No new ischemic stroke noted on MRI. - Per epileptology neurology: Continue keppra and start vimpat at discharge ($0 copay).  - Driving precautions reviewed. - Stopping bupropion and tramadol.   HTN urgency: Improved.  - Continue home meds, added hydralazine 25mg  with some improvement, good tolerance. Will continue 50mg  dose at DC.    Demyelinating disease:  - Restart baclofen, gabapentin. Stop tramadol.  - Follow up with Dr. Krista Blue after discharge.    Neurogenic bladder with urinary retention: - Bladder scan consistently showed retention, so foley placed 1/9, will DC prior to discharge and encourage continued self catheterizations..  - Follow up with Dr. Matilde Sprang 1/16 at 1:25pm.   UTI, history of ESBL UTI: Empirically given vancomycin and cefepime for concern of recurrent infection, though pt not septic. PCT 0.14, remains afebrile without urinary symptoms. Urine culture grew enterococcus. Has received 4 days of effective abx per culture. Will restart home macrobid at DC.     History of CVA 2015: Mild L weakness is stable.  - Continue ASA, plavix. Intolerant of atorvastatin.   CAD s/p PCI: No anginal complaints.  - ASA, plavix, coreg, rosuvastatin.    T2DM: History of this, though HbA1c 5.6%.  - No changes.   Stage IIIb CKD: Based on outpatient nephrologist, Dr. Holley Raring. Improving. - Avoid nephrotoxins/DC vancomycin for now. Monitor regularly.     Depression:  - Now that she's having seizures, will not be restarting wellbutrin - Restart trazodone, desvenlafaxine, prn alprazolam   GGO in right lung apex: Seen on CTA of the head and neck. No infiltrates on CXR, no sputum production. Pt had viral-sounding infection 2 weeks ago with cough  which may explain opacities. PCT 0.14 not consistent with bacterial pneumonia.  - Incentive spirometry - Aspiration  precautions - Monitor off directed abx.    Hypothyroidism:  -  Continue home low dose synthroid   Nausea: QTC 474msec.  - prn zofran   Abnormal LFTs:  - Monitor   History of bariatric surgery 2011 with anastomotic ulcer, GI bleeding Feb 2022 s/p double-balloon enteroscopy at [facility]. Nonsmoker - Avoid NSAIDs - Continue PPI   AOCKD: stable   Migraines: Quiescent. No changes to outpatient management.  Discharge Instructions Discharge Instructions     Diet - low sodium heart healthy   Complete by: As directed    Discharge instructions   Complete by: As directed    You were treated for UTI while you were here and should go back to taking macrobid at discharge. Follow up with Dr. Diona Fanti and continue self cathing 3-4 times per day.   Your blood pressure was quite elevated and this has improved with the addition of hydralazine. This will be prescribed at discharge to take 50mg  three times daily until you follow up with Dr. [name] or your PCP.   STOP taking tramadol and wellbutrin. These can lower the threshold for seizures. To prevent future seizures, start taking keppra 1,500mg  twice daily and vimpat 50mg  twice daily.   -Follow-up with Dr. Krista Blue at [facility] on 04/21/2021 at 0130 pm    Seizure precautions: Per [facility] statutes, patients with seizures are not allowed to drive until they have been seizure-free for six months and cleared by a physician    Use caution when using heavy equipment or power tools. Avoid working on ladders or at heights. Take showers instead of baths. Ensure the water temperature is not too high on the home water heater. Do not go swimming alone. Do not lock yourself in a room alone (i.e. bathroom). When caring for infants or small children, sit down when holding, feeding, or changing them to minimize risk of injury to the child in the event you have a seizure. Maintain good sleep hygiene. Avoid alcohol.    If patient has another seizure, call 911 and  bring them back to the ED if: A.  The seizure lasts longer than 5 minutes.      B.  The patient doesn't wake shortly after the seizure or has new problems such as difficulty seeing, speaking or moving following the seizure C.  The patient was injured during the seizure D.  The patient has a temperature over 102 F (39C) E.  The patient vomited during the seizure and now is having trouble breathing    During the Seizure   - First, ensure adequate ventilation and place patients on the floor on their left side  Loosen clothing around the neck and ensure the airway is patent. If the patient is clenching the teeth, do not force the mouth open with any object as this can cause severe damage - Remove all items from the surrounding that can be hazardous. The patient may be oblivious to what's happening and may not even know what he or she is doing. If the patient is confused and wandering, either gently guide him/her away and block access to outside areas - Reassure the individual and be comforting - Call 911. In most cases, the seizure ends before EMS arrives. However, there are cases when seizures may last over 3 to 5 minutes. Or the individual may have developed breathing difficulties or severe injuries. If a pregnant patient  or a person with diabetes develops a seizure, it is prudent to call an ambulance. - Finally, if the patient does not regain full consciousness, then call EMS. Most patients will remain confused for about 45 to 90 minutes after a seizure, so you must use judgment in calling for help. - Avoid restraints but make sure the patient is in a bed with padded side rails - Place the individual in a lateral position with the neck slightly flexed; this will help the saliva drain from the mouth and prevent the tongue from falling backward - Remove all nearby furniture and other hazards from the area - Provide verbal assurance as the individual is regaining consciousness - Provide the patient  with privacy if possible - Call for help and start treatment as ordered by the caregiver    After the Seizure (Postictal Stage)   After a seizure, most patients experience confusion, fatigue, muscle pain and/or a headache. Thus, one should permit the individual to sleep. For the next few days, reassurance is essential. Being calm and helping reorient the person is also of importance.   Most seizures are painless and end spontaneously. Seizures are not harmful to others but can lead to complications such as stress on the lungs, brain and the heart. Individuals with prior lung problems may develop labored breathing and respiratory distress.   Increase activity slowly   Complete by: As directed       Allergies as of 02/11/2021       Reactions   Lipitor [atorvastatin] Other (See Comments)   Leg pains        Medication List     STOP taking these medications    buPROPion ER 100 MG 12 hr tablet Commonly known as: WELLBUTRIN SR   levETIRAcetam 1000 MG/100ML Soln Commonly known as: KEPPRA   traMADol 50 MG tablet Commonly known as: ULTRAM       TAKE these medications    Aimovig 140 MG/ML Soaj Generic drug: Erenumab-aooe Inject 140 mg into the skin every 28 (twenty-eight) days.   ALPRAZolam 1 MG tablet Commonly known as: XANAX Take 1 tablet by mouth twice daily What changed: when to take this   aspirin 81 MG EC tablet Take 1 tablet (81 mg total) by mouth daily. Swallow whole.   Baclofen 5 MG Tabs Take 1 tablet by mouth 2 (two) times daily as needed (muscle spasms).   benzonatate 100 MG capsule Commonly known as: TESSALON Take 1 capsule (100 mg total) by mouth 3 (three) times daily as needed for cough.   carvedilol 12.5 MG tablet Commonly known as: COREG Take 1 tablet by mouth twice daily   clopidogrel 75 MG tablet Commonly known as: PLAVIX Take 1 tablet (75 mg total) by mouth daily.   desvenlafaxine 50 MG 24 hr tablet Commonly known as: PRISTIQ Take 50 mg by  mouth daily.   diclofenac Sodium 1 % Gel Commonly known as: VOLTAREN Apply 2 g topically 4 (four) times daily.   diphenoxylate-atropine 2.5-0.025 MG tablet Commonly known as: Lomotil Take 1 tablet by mouth 4 (four) times daily as needed for diarrhea or loose stools.   estradiol 0.1 MG/GM vaginal cream Commonly known as: ESTRACE Estrogen Cream Instruction Discard applicator Apply pea sized amount to tip of finger to urethra before bed. Wash hands well after application. Use Monday, Wednesday and Friday What changed:  how much to take how to take this when to take this   furosemide 20 MG tablet Commonly known as: LASIX Take  1 tablet by mouth every day as needed for swelling What changed: See the new instructions.   gabapentin 300 MG capsule Commonly known as: NEURONTIN Take 2 capsules (600 mg total) by mouth 2 (two) times daily.   glucose 4 GM chewable tablet Chew 1 tablet (4 g total) by mouth 3 (three) times daily as needed for low blood sugar. Home med.   hydrALAZINE 50 MG tablet Commonly known as: APRESOLINE Take 1 tablet (50 mg total) by mouth 3 (three) times daily.   isosorbide mononitrate 120 MG 24 hr tablet Commonly known as: IMDUR Take 1 tablet by mouth every day   lacosamide 50 MG Tabs tablet Commonly known as: VIMPAT Take 1 tablet (50 mg total) by mouth 2 (two) times daily.   levETIRAcetam 750 MG tablet Commonly known as: KEPPRA Take 2 tablets (1,500 mg total) by mouth 2 (two) times daily.   levothyroxine 25 MCG tablet Commonly known as: SYNTHROID Take 1 tablet by mouth 30 minutes before breakfast What changed: See the new instructions.   nitrofurantoin 100 MG capsule Commonly known as: Macrodantin Hold while on IV antibiotic. What changed:  how much to take how to take this when to take this additional instructions   nitroGLYCERIN 0.4 MG SL tablet Commonly known as: NITROSTAT Dissolve 1 tablet under tongue every 31mins, up to 3 doses, for chest  pain. Max 3 doses in 79mins. What changed: See the new instructions.   Nurtec 75 MG Tbdp Generic drug: Rimegepant Sulfate Take 75 mg by mouth as needed. What changed:  when to take this reasons to take this   pantoprazole 40 MG tablet Commonly known as: PROTONIX Take 1 tablet (40 mg total) by mouth 2 (two) times daily.   promethazine 25 MG tablet Commonly known as: PHENERGAN Take 1 tablet (25 mg total) by mouth every 6 (six) hours as needed for nausea or vomiting.   rosuvastatin 40 MG tablet Commonly known as: CRESTOR Take 1 tablet (40 mg total) by mouth daily at 6 PM.   SYSTANE OP Place 1 drop into both eyes daily as needed (dry eyes).   traZODone 100 MG tablet Commonly known as: DESYREL Take 1 to 2 tablets by mouth at bedtime What changed:  when to take this reasons to take this        [place] Follow up.   Specialty: [place] Why: The home health agency will contact you for the next home visit Contact information: [address] [place] [address] [phone]         [name], MD Follow up.   Specialty: Family Medicine Contact information: [address] [place] [address] [phone]         Minna Merritts, MD .   Specialty: Cardiology Contact information: [place] [address] [phone]         Marcial Pacas, MD Follow up.   Specialty: Neurology Contact information: [place] [address] [phone]         Franchot Gallo, MD Follow up.   Specialty: Urology Contact information: [address] [place] [address] [phone]                Allergies  Allergen Reactions   Lipitor [Atorvastatin] Other (See Comments)    Leg pains    Consultations: Neurology  Procedures/Studies: CT ANGIO HEAD NECK W WO CM  Result Date: 02/07/2021 CLINICAL DATA:  Encephalopathy EXAM:  CT ANGIOGRAPHY HEAD AND NECK TECHNIQUE: Multidetector CT imaging of the head and neck was performed using the standard protocol during bolus administration of intravenous contrast. Multiplanar CT image reconstructions and MIPs were obtained to evaluate the vascular anatomy. Carotid stenosis measurements (when applicable) are obtained utilizing NASCET criteria, using the distal internal carotid diameter as the denominator. CONTRAST:  54mL OMNIPAQUE IOHEXOL 350 MG/ML SOLN COMPARISON:  12/05/2020 FINDINGS: CT HEAD FINDINGS Brain: There is no mass, hemorrhage or extra-axial collection. The size and configuration of the ventricles and extra-axial CSF spaces are normal. There is an old right basal ganglia small vessel infarct. There is hypoattenuation of the periventricular white matter, most commonly indicating chronic ischemic microangiopathy. Skull: The visualized skull base, calvarium and extracranial soft tissues are normal. Sinuses/Orbits: No fluid levels or advanced mucosal thickening of the visualized paranasal sinuses. No mastoid or middle ear effusion. The orbits are normal. CTA NECK FINDINGS SKELETON: There is no bony spinal canal stenosis. No lytic or blastic lesion. OTHER NECK: Normal pharynx, larynx and major salivary glands. No cervical lymphadenopathy. Unremarkable thyroid gland. UPPER CHEST: Multifocal ground-glass opacity in the right lung apex. AORTIC ARCH: There is calcific atherosclerosis of the aortic arch. There is no aneurysm, dissection or hemodynamically significant stenosis of the visualized portion of the aorta. There is a right-sided aortic arch with aberrant left subclavian artery, which is surgically occluded. Patent graft between the left common carotid and left subclavian arteries. RIGHT CAROTID SYSTEM: No dissection, occlusion or aneurysm. Mild atherosclerotic calcification at the carotid bifurcation without hemodynamically significant stenosis. LEFT CAROTID SYSTEM: Early bifurcation. No  stenosis or other abnormality. VERTEBRAL ARTERIES: Right dominant configuration. Both origins are clearly patent. There is no dissection, occlusion or flow-limiting stenosis to the skull base (V1-V3 segments). CTA HEAD FINDINGS POSTERIOR CIRCULATION: --Vertebral arteries: Bilateral atherosclerotic calcification. --Inferior cerebellar arteries: Normal. --Basilar artery: Normal. --Superior cerebellar arteries: Normal. --Posterior cerebral arteries (PCA): Severe stenosis of the distal P2 segment of the left PCA, which remains patent distally. The right PCA is predominantly supplied by the posterior communicating artery. ANTERIOR CIRCULATION: --Intracranial internal carotid arteries: Atherosclerotic calcification of the internal carotid arteries at the skull base without hemodynamically significant stenosis. --Anterior cerebral arteries (ACA): Normal. Absent left A1 segment, normal variant --Middle cerebral arteries (MCA): Mild narrowing of the distal left M1 segment. Normal right MCA. VENOUS SINUSES: As permitted by contrast timing, patent. Review of the MIP images confirms the above findings. IMPRESSION: 1. No emergent large vessel occlusion. 2. Unchanged severe stenosis of the distal P2 segment of the left posterior cerebral artery, which remains patent distally. 3. Right-sided aortic arch with aberrant left subclavian artery, which is surgically occluded. Patent graft between the left common carotid and left subclavian arteries. 4. Multifocal ground-glass opacity in the right lung apex, which may indicate pneumonia. Aortic Atherosclerosis (ICD10-I70.0). Electronically Signed   By: Ulyses Jarred M.D.   On: 02/07/2021 21:19   MR BRAIN WO CONTRAST  Result Date: 02/11/2021 CLINICAL DATA:  Initial evaluation for acute seizure. EXAM: MRI HEAD WITHOUT CONTRAST TECHNIQUE: Multiplanar, multiecho pulse sequences of the brain and surrounding structures were obtained without intravenous contrast. COMPARISON:  Previous CTA  from 02/07/2021 and MRI from 12/05/2020. FINDINGS: Brain: Mildly advanced cerebral atrophy for age. Extensive patchy and confluent T2/FLAIR hyperintensity involving the periventricular and deep white matter both cerebral hemispheres as well as the pons, most consistent with chronic microvascular ischemic disease, fairly advanced in nature. Multiple scattered superimposed remote lacunar  infarcts present about the hemispheric cerebral white matter, basal ganglia, thalami, and pons. Remote lacunar infarct present at the right middle cerebellar peduncle. Small remote cortical to subcortical infarct noted at the left parieto-occipital region. Subtle 1.6 cm focus of curvilinear diffusion abnormality involving the deep white matter of the posterior left frontal centrum semi ovale (series 3, image 41). No visible ADC correlate. Finding suspected to reflect a small focus of evolving subacute small vessel ischemic change, although this could potentially reflect T2 shine through. Otherwise, no other diffusion abnormality to suggest acute or subacute ischemia or changes related to seizure. Gray-white matter differentiation otherwise maintained. No other areas of chronic cortical infarction. No acute intracranial hemorrhage. Single probable punctate focus of chronic microhemorrhage noted at the right temporal region, of doubtful significance in isolation. No mass lesion, midline shift or mass effect. No hydrocephalus or extra-axial fluid collection. Pituitary gland suprasellar region normal. Midline structures intact. No intrinsic temporal lobe abnormality. Vascular: Major intracranial vascular flow voids are maintained. Skull and upper cervical spine: Craniocervical junction within normal limits. Bone marrow signal intensity normal. No scalp soft tissue abnormality. Sinuses/Orbits: Patient status post bilateral ocular lens replacement. Globes and orbital soft tissues demonstrate no acute finding. Paranasal sinuses are largely  clear. No mastoid effusion. Inner ear structures grossly normal. Other: None. IMPRESSION: 1. 1.6 cm focus of curvilinear diffusion abnormality involving the deep white matter of the posterior left frontal centrum semi ovale as above. While this could potentially reflect a small focus of artifact/T2 shine through, a possible evolving subacute small vessel ischemic infarct could be considered in the correct clinical setting. 2. No other acute intracranial abnormality. 3. Age-related cerebral atrophy with advanced chronic microvascular ischemic disease, with multiple remote lacunar infarcts as above. Electronically Signed   By: Jeannine Boga M.D.   On: 02/11/2021 03:05   DG Chest Portable 1 View  Result Date: 02/07/2021 CLINICAL DATA:  Altered level of consciousness, weakness EXAM: PORTABLE CHEST 1 VIEW COMPARISON:  03/31/2020 FINDINGS: Single frontal view of the chest demonstrates an unremarkable cardiac silhouette. No acute airspace disease, effusion, or pneumothorax. No acute bony abnormalities. IMPRESSION: 1. No acute intrathoracic process. Electronically Signed   By: Randa Ngo M.D.   On: 02/07/2021 21:48   EEG adult  Result Date: 02/08/2021 Greta Doom, MD     02/08/2021  7:43 PM History: 51 yo F with altered mental status Sedation: Ativan given during study Technique: This 70 minute video EEG was acquired with electrodes placed according to the International 10-20 electrode system (including Fp1, Fp2, F3, F4, C3, C4, P3, P4, O1, O2, T3, T4, T5, T6, A1, A2, Fz, Cz, Pz). The following electrodes were missing or displaced: none. Background: The background consists of generalized 5 -6 Hz theta activity with superimposed bifrontally predominant sharp wave discharges varying between 2 - 3 Hz, occasionally interspersed with a run of 5-6 hz activity. Following ativan administration, this activity markedly improves, though throughout the study there continues to be occasional bifrontal sharp  wave discharges. The EEG improvement was accompanied by clinical improvement with the patient beginning to speak Photic stimulation: Physiologic driving is not performed EEG Abnormalities: 1) Frontal status epilepticus Clinical Interpretation: This EEG recorded evidence of focal frontal status epilepticus  with resolution following ativan administration. [name], MD [facility] [phone] If 7pm- 7am, please page neurology on call as listed in [place].   EEG adult  Result Date: 01/28/2021 Marcial Pacas, MD     02/05/2021  4:47 PM HISTORY: 51 year old female,  complains of frequent migraine headaches, MRI of the brain showed extensive supratentorium T2/FLAIR hyperintensity involvement TECHNIQUE: This is a routine 16 channel EEG recording with one channel devoted to a limited EKG recording.  It was performed during wakefulness, drowsiness and asleep.  Hyperventilation and photic stimulation were performed as activating procedures.  There are minimum muscle and movement artifact noted. Upon maximum arousal, posterior dominant waking rhythm consistent of rhythmic alpha range activity, with frequency of 8 Hz. Activities are symmetric over the bilateral posterior derivations and attenuated with eye opening. Hyperventilation produced mild/moderate buildup with higher amplitude and the slower activities noted. Photic stimulation did not alter the tracing. During EEG recording, patient developed drowsiness and no deeper stage of sleep was achieved. During EEG recording, there was no epileptiform discharge noted. EKG demonstrate sinus rhythm, with heart rate of 64 bpm CONCLUSION: This is a  normal awake EEG.  There is no electrodiagnostic evidence of epileptiform discharge. Marcial Pacas, M.D. Ph.D. [facility] [place], [place] [address] Phone: [phone] Fax:      [phone]   Overnight EEG with video  Result Date: 02/09/2021 Lora Havens, MD     02/10/2021  9:51 AM  Patient Name: Hailey Stormer MRN: [id number] Epilepsy Attending: Lora Havens Referring Physician/Provider: Dr Donnetta Simpers Duration: 02/08/2021 2347 to 02/09/2021 2347 Patient history: 51 yo F with altered mental status, routine eeg showed status epilepticus. LTM eeg to evaluate for seizure Level of alertness: lethargic AEDs during EEG study: Ativan Technical aspects: This EEG study was done with scalp electrodes positioned according to the 10-20 International system of electrode placement. Electrical activity was acquired at a sampling rate of 500Hz  and reviewed with a high frequency filter of 70Hz  and a low frequency filter of 1Hz . EEG data were recorded continuously and digitally stored. Description: The posterior dominant rhythm consists of 7.5 Hz activity of moderate voltage (25-35 uV) seen predominantly in posterior head regions, symmetric and reactive to eye opening and eye closing. EEG showed continuous generalized 5 to 7 Hz theta slowing. Intermittent generalized and maximal bifrontal periodic discharges were noted at 2-2.5Hz . Hyperventilation and photic stimulation were not performed.   ABNORMALITY - Periodic epileptiform discharges, generalized and maximal bifrontal( GPDs) - Continuous slow, generalized IMPRESSION: This study showed generalized and maximal bifrontal epileptiform discharges at 2-2.5Hz  which is on the ictal-interictal continuum with high potential for seizure recurrence. Additionally, EEG is suggestive of moderate diffuse encephalopathy, nonspecific etiology. No definite seizures were seen throughout the recording. Priyanka Barbra Sarks   DG FL GUIDED LUMBAR PUNCTURE  Result Date: 01/22/2021 CLINICAL DATA:  51 year old female with extremity tingling and numbness. There is clinical concern for possible demyelinating process. She presents for lumbar puncture to acquire CSF for labs. EXAM: DIAGNOSTIC LUMBAR PUNCTURE UNDER FLUOROSCOPIC GUIDANCE COMPARISON:  MRI head 12/05/2020  FLUOROSCOPY TIME:  Fluoroscopy Time:  0 minutes 27 seconds Radiation Exposure Index (if provided by the fluoroscopic device): 3.6 mGy Number of Acquired Spot Images: 0 PROCEDURE: Informed consent was obtained from the patient prior to the procedure, including potential complications of headache, allergy, and pain. With the patient prone, the lower back was prepped with Betadine. 1% Lidocaine was used for local anesthesia. Lumbar puncture was performed at the L3-L4 level using a 20 gauge needle with return of clear CSF with an opening pressure of 12 cm water. Thirteen ml of CSF were obtained for laboratory studies. The patient tolerated the procedure well and there were no apparent complications. IMPRESSION: Technically successful L3-L4  lumbar puncture. Electronically Signed   By: Jacqulynn Cadet M.D.   On: 01/22/2021 16:15     Subjective: Feels much better today, no seizures overnight. No fevers. Amenable to going home.  Discharge Exam: Vitals:   02/11/21 0828 02/11/21 1204  BP:  127/72  Pulse: 75 74  Resp:  12  Temp:  98.1 F (36.7 C)  SpO2:     General: Pt is alert, awake, not in acute distress Cardiovascular: RRR, S1/S2 +, no rubs, no gallops Respiratory: CTA bilaterally, no wheezing, no rhonchi Abdominal: Soft, NT, ND, bowel sounds + Extremities: No edema, no cyanosis  Labs: BNP (last 3 results) No results for input(s): BNP in the last 8760 hours. Basic Metabolic Panel: Recent Labs  Lab 02/07/21 1227 02/08/21 0702 02/09/21 0133 02/10/21 0355 02/11/21 0826  NA 141  --  143 137 135  K 4.5  --  4.7 3.6 3.7  CL 107  --  108 102 101  CO2 25  --  23 25 26   GLUCOSE 160*  --  126* 135* 129*  BUN 16  --  21* 16 17  CREATININE 1.32* 1.17* 1.79* 1.41* 1.65*  CALCIUM 9.6  --  8.7* 8.8* 8.7*   Liver Function Tests: Recent Labs  Lab 02/07/21 1227 02/09/21 0133 02/10/21 0355  AST 55* 30 27  ALT 74* 46* 41  ALKPHOS 172* 127* 116  BILITOT 0.7 0.5 0.4  PROT 7.6 6.4* 6.3*   ALBUMIN 4.1 3.1* 3.2*   No results for input(s): LIPASE, AMYLASE in the last 168 hours. No results for input(s): AMMONIA in the last 168 hours. CBC: Recent Labs  Lab 02/07/21 1227 02/09/21 0133 02/11/21 0826  WBC 12.6* 13.1* 7.2  NEUTROABS  --  9.9*  --   HGB 15.4* 14.2 12.8  HCT 49.1* 44.8 39.3  MCV 82.2 83.6 82.0  PLT 167 156 114*   Cardiac Enzymes: No results for input(s): CKTOTAL, CKMB, CKMBINDEX, TROPONINI in the last 168 hours. BNP: Invalid input(s): POCBNP CBG: Recent Labs  Lab 02/10/21 2335 02/11/21 0304 02/11/21 0338 02/11/21 0746 02/11/21 1207  GLUCAP 108* 92 94 145* 203*   D-Dimer No results for input(s): DDIMER in the last 72 hours. Hgb A1c No results for input(s): HGBA1C in the last 72 hours. Lipid Profile No results for input(s): CHOL, HDL, LDLCALC, TRIG, CHOLHDL, LDLDIRECT in the last 72 hours. Thyroid function studies No results for input(s): TSH, T4TOTAL, T3FREE, THYROIDAB in the last 72 hours.  Invalid input(s): FREET3 Anemia work up No results for input(s): VITAMINB12, FOLATE, FERRITIN, TIBC, IRON, RETICCTPCT in the last 72 hours. Urinalysis    Component Value Date/Time   COLORURINE YELLOW (A) 02/07/2021 2229   APPEARANCEUR HAZY (A) 02/07/2021 2229   APPEARANCEUR Clear 12/19/2020 1411   LABSPEC 1.017 02/07/2021 2229   LABSPEC 1.012 12/29/2013 1210   PHURINE 5.0 02/07/2021 2229   GLUCOSEU NEGATIVE 02/07/2021 2229   GLUCOSEU >=500 12/29/2013 1210   HGBUR MODERATE (A) 02/07/2021 2229   HGBUR negative 04/14/2010 1435   BILIRUBINUR NEGATIVE 02/07/2021 2229   BILIRUBINUR Negative 12/19/2020 1411   BILIRUBINUR Negative 12/29/2013 1210   KETONESUR NEGATIVE 02/07/2021 2229   PROTEINUR 100 (A) 02/07/2021 2229   UROBILINOGEN 0.2 04/18/2020 1326   UROBILINOGEN negative 04/14/2010 1435   NITRITE NEGATIVE 02/07/2021 2229   LEUKOCYTESUR SMALL (A) 02/07/2021 2229   LEUKOCYTESUR Negative 12/29/2013 1210    Microbiology Recent Results (from the  past 240 hour(s))  Resp Panel by RT-PCR (Flu A&B, Covid) Nasopharyngeal Swab  Status: None   Collection Time: 02/07/21 10:29 PM   Specimen: Nasopharyngeal Swab; Nasopharyngeal(NP) swabs in vial transport medium  Result Value Ref Range Status   SARS Coronavirus 2 by RT PCR NEGATIVE NEGATIVE Final    Comment: (NOTE) SARS-CoV-2 target nucleic acids are NOT DETECTED.  The SARS-CoV-2 RNA is generally detectable in upper respiratory specimens during the acute phase of infection. The lowest concentration of SARS-CoV-2 viral copies this assay can detect is 138 copies/mL. A negative result does not preclude SARS-Cov-2 infection and should not be used as the sole basis for treatment or other patient management decisions. A negative result may occur with  improper specimen collection/handling, submission of specimen other than nasopharyngeal swab, presence of viral mutation(s) within the areas targeted by this assay, and inadequate number of viral copies(<138 copies/mL). A negative result must be combined with clinical observations, patient history, and epidemiological information. The expected result is Negative.  Fact Sheet for Patients:  EntrepreneurPulse.com.au  Fact Sheet for Healthcare Providers:  IncredibleEmployment.be  This test is no t yet approved or cleared by the [place] FDA and  has been authorized for detection and/or diagnosis of SARS-CoV-2 by FDA under an Emergency Use Authorization (EUA). This EUA will remain  in effect (meaning this test can be used) for the duration of the COVID-19 declaration under Section 564(b)(1) of the Act, 21 U.S.C.section 360bbb-3(b)(1), unless the authorization is terminated  or revoked sooner.       Influenza A by PCR NEGATIVE NEGATIVE Final   Influenza B by PCR NEGATIVE NEGATIVE Final    Comment: (NOTE) The Xpert Xpress SARS-CoV-2/FLU/RSV plus assay is intended as an aid in the diagnosis of  influenza from Nasopharyngeal swab specimens and should not be used as a sole basis for treatment. Nasal washings and aspirates are unacceptable for Xpert Xpress SARS-CoV-2/FLU/RSV testing.  Fact Sheet for Patients: EntrepreneurPulse.com.au  Fact Sheet for Healthcare Providers: IncredibleEmployment.be  This test is not yet approved or cleared by the [place] FDA and has been authorized for detection and/or diagnosis of SARS-CoV-2 by FDA under an Emergency Use Authorization (EUA). This EUA will remain in effect (meaning this test can be used) for the duration of the COVID-19 declaration under Section 564(b)(1) of the Act, 21 U.S.C. section 360bbb-3(b)(1), unless the authorization is terminated or revoked.  Performed at [facility], [address] [place], [place] [address]   Blood culture (routine x 2)     Status: None (Preliminary result)   Collection Time: 02/07/21 10:29 PM   Specimen: BLOOD  Result Value Ref Range Status   Specimen Description BLOOD RIGHT ANTECUBITAL  Final   Special Requests   Final    BOTTLES DRAWN AEROBIC AND ANAEROBIC Blood Culture results may not be optimal due to an inadequate volume of blood received in culture bottles   Culture   Final    NO GROWTH 4 DAYS Performed at [facility], [address], [place], [place] [address]    Report Status PENDING  Incomplete  Urine Culture     Status: Abnormal   Collection Time: 02/07/21 10:29 PM   Specimen: Urine, Clean Catch  Result Value Ref Range Status   Specimen Description   Final    URINE, CLEAN CATCH Performed at [facility], [address], [place], [place] [address]    Special Requests   Final    NONE Performed at [facility], [address] [place], [place] [address]    Culture (A)  Final    >=  100,000 COLONIES/mL ENTEROBACTER CLOACAE Two isolates with different morphologies were identified as the same  organism.The most resistant organism was reported. Performed at [facility], [place] [address] [place], [place] [address]    Report Status 02/11/2021 FINAL  Final   Organism ID, Bacteria ENTEROBACTER CLOACAE (A)  Final      Susceptibility   Enterobacter cloacae - MIC*    CEFAZOLIN RESISTANT Resistant     CEFEPIME <=0.12 SENSITIVE Sensitive     CIPROFLOXACIN 0.5 INTERMEDIATE Intermediate     GENTAMICIN <=1 SENSITIVE Sensitive     IMIPENEM <=0.25 SENSITIVE Sensitive     NITROFURANTOIN >=512 RESISTANT Resistant     TRIMETH/SULFA <=20 SENSITIVE Sensitive     PIP/TAZO 8 SENSITIVE Sensitive     * >=100,000 COLONIES/mL ENTEROBACTER CLOACAE  Culture, blood (Routine X 2) w Reflex to ID Panel     Status: None (Preliminary result)   Collection Time: 02/08/21  3:26 PM   Specimen: BLOOD  Result Value Ref Range Status   Specimen Description BLOOD RIGHT ANTECUBITAL  Final   Special Requests Blood Culture adequate volume  Final   Culture   Final    NO GROWTH 3 DAYS Performed at [facility], [address] [place], [place] [address]    Report Status PENDING  Incomplete    Time coordinating discharge: Approximately 40 minutes  Patrecia Pour, MD  [facility] 02/11/2021, 12:49 PM

## 2021-02-12 ENCOUNTER — Telehealth: Payer: Self-pay

## 2021-02-12 LAB — CULTURE, BLOOD (ROUTINE X 2): Culture: NO GROWTH

## 2021-02-12 NOTE — Telephone Encounter (Signed)
Pt called back, states her VM is messed up and she is not able to listen to the message that was left. Please call pt back with results.

## 2021-02-12 NOTE — Telephone Encounter (Signed)
Spoke to pt Pt verified by name and DOB,  results given per provider, pt voiced understanding all question answered.

## 2021-02-12 NOTE — Telephone Encounter (Signed)
Transition Care Management Follow-up Telephone Call Date of discharge and from where: [place] 02-11-21 Dx: status epilepticus How have you been since you were released from the hospital? I'm doing good  Any questions or concerns? No  Items Reviewed: Did the pt receive and understand the discharge instructions provided? Yes  Medications obtained and verified? Yes  Other? No  Any new allergies since your discharge? No  Dietary orders reviewed? Yes Do you have support at home? Yes   Home Care and Equipment/Supplies: Were home health services ordered? Yes PT/OT If so, what is the name of the agency? [facility]  Has the agency set up a time to come to the patient's home? no Were any new equipment or medical supplies ordered?  No What is the name of the medical supply agency? na Were you able to get the supplies/equipment? not applicable Do you have any questions related to the use of the equipment or supplies? Yes: pt states that the wheels on her rollator Is not working correctly and would like a prescription for a new one   Functional Questionnaire: (I = Independent and D = Dependent) ADLs: I  Bathing/Dressing- I  Meal Prep- D  Eating- I  Maintaining continence- I  Transferring/Ambulation- D  Managing Meds- I  Follow up appointments reviewed:  PCP Hospital f/u appt confirmed? Yes  Scheduled to see Dr Caryn Section on 02-18-21 @ [facility] f/u appt confirmed? Yes  Scheduled to see Dr Krista Blue on 04-21-21 @ 130pm Are transportation arrangements needed? No  If their condition worsens, is the pt aware to call PCP or go to the Emergency Dept.? Yes Was the patient provided with contact information for the PCP's office or ED? Yes Was to pt encouraged to call back with questions or concerns? Yes

## 2021-02-13 ENCOUNTER — Telehealth: Payer: Self-pay | Admitting: Family Medicine

## 2021-02-13 ENCOUNTER — Ambulatory Visit: Payer: Commercial Managed Care - HMO | Admitting: Internal Medicine

## 2021-02-13 ENCOUNTER — Telehealth: Payer: Self-pay | Admitting: *Deleted

## 2021-02-13 LAB — CULTURE, BLOOD (ROUTINE X 2)
Culture: NO GROWTH
Special Requests: ADEQUATE
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
## 2021-02-13 NOTE — Telephone Encounter (Signed)
FYI....   I called [name] she could not remember what her BP was she states "it was something over 100."  She denies chest pain/palpitations, shortness of breath, headache etc. She says she is "only tired."  I reminded her of her apt 02/18/2021 at 9:40.  I also advised her if she starts experiencing new or worsening symptoms to call 911.  Pt agreed.    Thanks,   -Mickel Baas
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
## 2021-02-13 NOTE — Telephone Encounter (Signed)
Duplicate message. 

## 2021-02-14 ENCOUNTER — Telehealth: Payer: Self-pay

## 2021-02-14 NOTE — Telephone Encounter (Signed)
Copied from [place] [id number]. Topic: General - Other >> Feb 14, 2021 12:09 PM Tessa Lerner A wrote: Reason for CRM: Juliann Pulse with [place] has called to share that the patient was hospitalized from 02/08/21 through 02/11/21 for epileptic concerns   The patient's Wellbutrin and tramadol have been ended   The patient has began taking Keppra and Vimpat  The patient is also taking MACRODANTIN for frequent urinary discomfort   Juliann Pulse is the patient's case manager with [facility] [employer] would like to be contacted if there are any additional questions

## 2021-02-17 ENCOUNTER — Encounter: Payer: Self-pay | Admitting: Urology

## 2021-02-17 ENCOUNTER — Other Ambulatory Visit: Payer: Self-pay

## 2021-02-17 ENCOUNTER — Ambulatory Visit (INDEPENDENT_AMBULATORY_CARE_PROVIDER_SITE_OTHER): Payer: Commercial Managed Care - HMO | Admitting: Urology

## 2021-02-17 VITALS — BP 190/92 | HR 81 | Ht 63.0 in | Wt 150.0 lb

## 2021-02-17 DIAGNOSIS — N3946 Mixed incontinence: Secondary | ICD-10-CM

## 2021-02-17 LAB — MICROSCOPIC EXAMINATION: Bacteria, UA: NONE SEEN

## 2021-02-17 LAB — URINALYSIS, COMPLETE
Bilirubin, UA: NEGATIVE
Glucose, UA: NEGATIVE
Ketones, UA: NEGATIVE
Leukocytes,UA: NEGATIVE
Nitrite, UA: NEGATIVE
RBC, UA: NEGATIVE
Specific Gravity, UA: 1.015 (ref 1.005–1.030)
Urobilinogen, Ur: 0.2 mg/dL (ref 0.2–1.0)
pH, UA: 5.5 (ref 5.0–7.5)

## 2021-02-17 MED ORDER — MIRABEGRON ER 50 MG PO TB24: 50.0000 mg | ORAL_TABLET | Freq: Every day | ORAL | 11 refills | Status: AC

## 2021-02-17 MED ORDER — FLUCONAZOLE 100 MG PO TABS: 100.0000 mg | ORAL_TABLET | Freq: Every day | ORAL | 0 refills | Status: AC
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
## 2021-02-18 ENCOUNTER — Encounter: Payer: Self-pay | Admitting: Family Medicine

## 2021-02-18 ENCOUNTER — Ambulatory Visit (INDEPENDENT_AMBULATORY_CARE_PROVIDER_SITE_OTHER): Payer: Medicare Other | Admitting: Family Medicine

## 2021-02-18 VITALS — BP 128/83 | HR 88 | Temp 98.2°F | Resp 18 | Wt 151.9 lb

## 2021-02-18 DIAGNOSIS — F331 Major depressive disorder, recurrent, moderate: Secondary | ICD-10-CM | POA: Diagnosis not present

## 2021-02-18 DIAGNOSIS — G40909 Epilepsy, unspecified, not intractable, without status epilepticus: Secondary | ICD-10-CM | POA: Diagnosis not present

## 2021-02-18 DIAGNOSIS — F418 Other specified anxiety disorders: Secondary | ICD-10-CM

## 2021-02-18 NOTE — Progress Notes (Signed)
Established patient visit   Patient: [name]   DOB: [birth year]   51 y.o. Female  MRN: [id number] Visit Date: 02/18/2021  Today's healthcare provider: [name], MD   Chief Complaint  Patient presents with   Hospitalization Follow-up   Subjective    HPI  Follow up Hospitalization  Patient was admitted to [facility] on 02/08/21 and discharged on 02/11/21. She was treated for epileptic concerns, altered mental status, hypertension and UTI. Treatment for this included The patient's Wellbutrin and tramadol have been disontinued. The patient has began taking Keppra and Vimpat. The patient is also taking MACRODANTIN for frequent urinary discomfort . Telephone follow up was done on 02/12/21 She reports good compliance with treatment. Patient was seen by Urology on 02/17/2021 as outpatient. She has follow up with Dr. Krista Blue scheduled on 04/21/2021 She reports this condition is improved.  -----------------------------------------------------------------------------------------    Medications: Outpatient Medications Prior to Visit  Medication Sig   AIMOVIG 140 MG/ML SOAJ Inject 140 mg into the skin every 28 (twenty-eight) days.   ALPRAZolam (XANAX) 1 MG tablet Take 1 tablet by mouth twice daily (Patient taking differently: Take 1 mg by mouth at bedtime.)   aspirin EC 81 MG EC tablet Take 1 tablet (81 mg total) by mouth daily. Swallow whole.   Baclofen 5 MG TABS Take 1 tablet by mouth 2 (two) times daily as needed (muscle spasms).   carvedilol (COREG) 12.5 MG tablet Take 1 tablet by mouth twice daily   clopidogrel (PLAVIX) 75 MG tablet Take 1 tablet (75 mg total) by mouth daily.   desvenlafaxine (PRISTIQ) 50 MG 24 hr tablet Take 50 mg by mouth daily.   diclofenac Sodium (VOLTAREN) 1 % GEL Apply 2 g topically 4 (four) times daily.   diphenoxylate-atropine (LOMOTIL) 2.5-0.025 MG tablet Take 1 tablet by mouth 4 (four) times daily as needed for diarrhea or loose stools.    estradiol (ESTRACE) 0.1 MG/GM vaginal cream Estrogen Cream Instruction Discard applicator Apply pea sized amount to tip of finger to urethra before bed. Wash hands well after application. Use Monday, Wednesday and Friday (Patient taking differently: Place 1 Applicatorful vaginally 3 (three) times a week. Estrogen Cream Instruction Discard applicator Apply pea sized amount to tip of finger to urethra before bed. Wash hands well after application. Use Monday, Wednesday and Friday)   fluconazole (DIFLUCAN) 100 MG tablet Take 1 tablet (100 mg total) by mouth daily for 3 days. X 7 days   furosemide (LASIX) 20 MG tablet Take 1 tablet by mouth every day as needed for swelling (Patient taking differently: Take 20 mg by mouth daily as needed (swelling).)   gabapentin (NEURONTIN) 300 MG capsule Take 2 capsules (600 mg total) by mouth 2 (two) times daily.   glucose 4 GM chewable tablet Chew 1 tablet (4 g total) by mouth 3 (three) times daily as needed for low blood sugar. Home med.   hydrALAZINE (APRESOLINE) 50 MG tablet Take 1 tablet (50 mg total) by mouth 3 (three) times daily.   isosorbide mononitrate (IMDUR) 120 MG 24 hr tablet Take 1 tablet by mouth every day   lacosamide (VIMPAT) 50 MG TABS tablet Take 1 tablet (50 mg total) by mouth 2 (two) times daily.   levETIRAcetam (KEPPRA) 750 MG tablet Take 2 tablets (1,500 mg total) by mouth 2 (two) times daily.   levothyroxine (SYNTHROID) 25 MCG tablet Take 1 tablet by mouth 30 minutes before breakfast (Patient taking differently: Take 25 mcg by  mouth daily before breakfast.)   mirabegron ER (MYRBETRIQ) 50 MG TB24 tablet Take 1 tablet (50 mg total) by mouth daily.   nitrofurantoin (MACRODANTIN) 100 MG capsule Hold while on IV antibiotic. (Patient taking differently: Take 100 mg by mouth 2 (two) times daily.)   nitroGLYCERIN (NITROSTAT) 0.4 MG SL tablet Dissolve 1 tablet under tongue every 30mins, up to 3 doses, for chest pain. Max 3 doses in 76mins. (Patient taking  differently: Place 0.4 mg under the tongue every 5 (five) minutes x 3 doses as needed for chest pain.)   pantoprazole (PROTONIX) 40 MG tablet Take 1 tablet (40 mg total) by mouth 2 (two) times daily.   Polyethyl Glycol-Propyl Glycol (SYSTANE OP) Place 1 drop into both eyes daily as needed (dry eyes).   promethazine (PHENERGAN) 25 MG tablet Take 1 tablet (25 mg total) by mouth every 6 (six) hours as needed for nausea or vomiting.   Rimegepant Sulfate (NURTEC) 75 MG TBDP Take 75 mg by mouth as needed. (Patient taking differently: Take 75 mg by mouth daily as needed (migraine).)   rosuvastatin (CRESTOR) 40 MG tablet Take 1 tablet (40 mg total) by mouth daily at 6 PM.   traZODone (DESYREL) 100 MG tablet Take 1 to 2 tablets by mouth at bedtime (Patient taking differently: Take 100-200 mg by mouth at bedtime as needed for sleep.)   [DISCONTINUED] benzonatate (TESSALON) 100 MG capsule Take 1 capsule (100 mg total) by mouth 3 (three) times daily as needed for cough. (Patient not taking: Reported on 02/18/2021)   No facility-administered medications prior to visit.    Review of Systems  Constitutional:  Negative for appetite change, chills, fatigue and fever.  Respiratory:  Negative for chest tightness and shortness of breath.   Cardiovascular:  Negative for chest pain and palpitations.  Gastrointestinal:  Negative for abdominal pain, nausea and vomiting.  Neurological:  Negative for dizziness and weakness.      Objective    BP 128/83 (BP Location: Left Arm, Patient Position: Sitting, Cuff Size: Normal)    Pulse 88    Temp 98.2 F (36.8 C) (Oral)    Resp 18    Wt 151 lb 14.4 oz (68.9 kg)    SpO2 100% Comment: room air   BMI 26.91 kg/m  {Show previous vital signs (optional):23777}  Physical Exam   General: Appearance:     Well developed, well nourished female in no acute distress  Eyes:    PERRL, conjunctiva/corneas clear, EOM's intact       Lungs:     Clear to auscultation bilaterally,  respirations unlabored  Heart:    Normal heart rate. Normal rhythm. No murmurs, rubs, or gallops.    MS:   All extremities are intact.    Neurologic:   Awake, alert, oriented x 3. No apparent focal neurological defect.          Assessment & Plan     1. Depression with anxiety Continue current medications.  Off bupropion due to suspected seizure.   2. Seizure disorder ([place]) New onset. Is tolerating Keppra and Vimpac prescribed during recent hospitalization, but is feeling more sleepy than usual.  - Lacosamide - Comprehensive metabolic panel  If labs normal then she is to follow up with Dr. Krista Blue in March as scheduled.   3. Accelerated hypertension.  Discharged with addition of hydralazine which she is tolerating well. Advised she could reduce dose to 1/2 tablet at a time if she has problems with low blood pressure. She has  follow up with cardiology on 03-11-2020       The entirety of the information documented in the History of Present Illness, Review of Systems and Physical Exam were personally obtained by me. Portions of this information were initially documented by the CMA and reviewed by me for thoroughness and accuracy.     [name], MD  [facility] [phone] (phone) [phone] (fax)  [place]

## 2021-02-19 ENCOUNTER — Other Ambulatory Visit: Payer: Self-pay | Admitting: *Deleted

## 2021-02-19 DIAGNOSIS — D508 Other iron deficiency anemias: Secondary | ICD-10-CM

## 2021-02-20 LAB — FUNGUS CULTURE W SMEAR
CULTURE:: NO GROWTH
MICRO NUMBER:: 12785614
SMEAR:: NONE SEEN
SPECIMEN QUALITY:: ADEQUATE

## 2021-02-20 LAB — CSF CELL COUNT WITH DIFFERENTIAL
RBC Count, CSF: 3 cells/uL — ABNORMAL HIGH
WBC, CSF: 2 cells/uL (ref 0–5)

## 2021-02-20 LAB — GRAM STAIN
MICRO NUMBER:: 12785613
SPECIMEN QUALITY:: ADEQUATE

## 2021-02-20 LAB — MULTIPLE SCLEROSIS PANEL 2
Albumin Serum: 4.2 g/dL (ref 3.5–5.2)
Albumin, CSF: 18.5 mg/dL (ref 8.0–42.0)
CNS-IgG Synthesis Rate: -1.1 mg/24 h (ref <=3.3)
IgG (Immunoglobin G), Serum: 1030 mg/dL (ref 600–1640)
IgG Total CSF: 2.6 mg/dL (ref 0.8–7.7)
IgG-Index: 0.57 (ref <0.66)
Myelin Basic Protein: <2 mcg/L (ref <=4.0)

## 2021-02-20 LAB — PROTEIN, CSF: Total Protein, CSF: 33 mg/dL (ref 15–45)

## 2021-02-20 LAB — GLUCOSE, CSF: Glucose, CSF: 70 mg/dL (ref 40–80)

## 2021-02-20 LAB — VDRL, CSF: VDRL Quant, CSF: NONREACTIVE

## 2021-02-21 ENCOUNTER — Encounter: Payer: Self-pay | Admitting: Oncology

## 2021-02-21 LAB — COMPREHENSIVE METABOLIC PANEL
ALT: 28 IU/L (ref 0–32)
AST: 22 IU/L (ref 0–40)
Albumin/Globulin Ratio: 1.8 (ref 1.2–2.2)
Albumin: 4.4 g/dL (ref 3.8–4.9)
Alkaline Phosphatase: 158 IU/L — ABNORMAL HIGH (ref 44–121)
BUN/Creatinine Ratio: 10 (ref 9–23)
BUN: 19 mg/dL (ref 6–24)
Bilirubin Total: <0.2 mg/dL (ref 0.0–1.2)
CO2: 24 mmol/L (ref 20–29)
Calcium: 9.3 mg/dL (ref 8.7–10.2)
Chloride: 101 mmol/L (ref 96–106)
Creatinine, Ser: 1.83 mg/dL — ABNORMAL HIGH (ref 0.57–1.00)
Globulin, Total: 2.5 g/dL (ref 1.5–4.5)
Glucose: 225 mg/dL — ABNORMAL HIGH (ref 70–99)
Potassium: 3.6 mmol/L (ref 3.5–5.2)
Sodium: 140 mmol/L (ref 134–144)
Total Protein: 6.9 g/dL (ref 6.0–8.5)
eGFR: 33 mL/min/{1.73_m2} — ABNORMAL LOW (ref >59)

## 2021-02-21 LAB — LACOSAMIDE: Lacosamide: 5.5 ug/mL (ref 5.0–10.0)

## 2021-02-24 ENCOUNTER — Other Ambulatory Visit: Payer: Self-pay | Admitting: Family Medicine

## 2021-02-24 DIAGNOSIS — G4700 Insomnia, unspecified: Secondary | ICD-10-CM

## 2021-02-25 ENCOUNTER — Telehealth: Payer: Self-pay

## 2021-02-25 DIAGNOSIS — K6389 Other specified diseases of intestine: Secondary | ICD-10-CM

## 2021-02-25 NOTE — Telephone Encounter (Signed)
CT Scan scheduled for February 7th, arrive at 9:15 to [address], [place], [place] [address]

## 2021-02-25 NOTE — Addendum Note (Signed)
Addended by: Lurlean Nanny on: 02/25/2021 11:56 AM   Modules accepted: Orders

## 2021-02-25 NOTE — Telephone Encounter (Signed)
-----   Message from [place], [place] sent at 01/02/2021  4:59 PM EST ----- Pt needs repeat CT scan. See wohl's note for 01/02/21 for reason.

## 2021-02-26 ENCOUNTER — Inpatient Hospital Stay: Payer: Medicare Other | Attending: Oncology

## 2021-02-26 ENCOUNTER — Other Ambulatory Visit: Payer: Self-pay

## 2021-02-26 DIAGNOSIS — D509 Iron deficiency anemia, unspecified: Secondary | ICD-10-CM | POA: Insufficient documentation

## 2021-02-26 DIAGNOSIS — D508 Other iron deficiency anemias: Secondary | ICD-10-CM

## 2021-02-26 LAB — CBC WITH DIFFERENTIAL/PLATELET
Abs Immature Granulocytes: 0.07 10*3/uL (ref 0.00–0.07)
Basophils Absolute: 0.1 10*3/uL (ref 0.0–0.1)
Basophils Relative: 1 %
Eosinophils Absolute: 0.3 10*3/uL (ref 0.0–0.5)
Eosinophils Relative: 3 %
HCT: 38.1 % (ref 36.0–46.0)
Hemoglobin: 12.1 g/dL (ref 12.0–15.0)
Immature Granulocytes: 1 %
Lymphocytes Relative: 20 %
Lymphs Abs: 1.8 10*3/uL (ref 0.7–4.0)
MCH: 27.3 pg (ref 26.0–34.0)
MCHC: 31.8 g/dL (ref 30.0–36.0)
MCV: 86 fL (ref 80.0–100.0)
Monocytes Absolute: 0.5 10*3/uL (ref 0.1–1.0)
Monocytes Relative: 5 %
Neutro Abs: 6.2 10*3/uL (ref 1.7–7.7)
Neutrophils Relative %: 70 %
Platelets: 144 10*3/uL — ABNORMAL LOW (ref 150–400)
RBC: 4.43 MIL/uL (ref 3.87–5.11)
RDW: 14.6 % (ref 11.5–15.5)
WBC: 8.9 10*3/uL (ref 4.0–10.5)
nRBC: 0 % (ref 0.0–0.2)

## 2021-02-26 LAB — IRON AND TIBC
Iron: 62 ug/dL (ref 28–170)
Saturation Ratios: 22 % (ref 10.4–31.8)
TIBC: 281 ug/dL (ref 250–450)
UIBC: 219 ug/dL

## 2021-02-26 LAB — FERRITIN: Ferritin: 72 ng/mL (ref 11–307)

## 2021-02-26 NOTE — Telephone Encounter (Signed)
Left message on voicemail.

## 2021-02-27 ENCOUNTER — Inpatient Hospital Stay: Payer: Medicare Other

## 2021-02-27 ENCOUNTER — Encounter: Payer: Self-pay | Admitting: Nurse Practitioner

## 2021-02-27 ENCOUNTER — Inpatient Hospital Stay (HOSPITAL_BASED_OUTPATIENT_CLINIC_OR_DEPARTMENT_OTHER): Payer: Medicare Other | Admitting: Nurse Practitioner

## 2021-02-27 DIAGNOSIS — D509 Iron deficiency anemia, unspecified: Secondary | ICD-10-CM

## 2021-02-27 NOTE — Progress Notes (Signed)
[facility]  Telephone:([phone] [phone]  Virtual Visit Progress Note  I connected with [name] on 02/27/21 at  2:30 PM EST by video enabled telemedicine visit and verified that I am speaking with the correct person using two identifiers.   I discussed the limitations, risks, security and privacy concerns of performing an evaluation and management service by telemedicine and the availability of in-person appointments. I also discussed with the patient that there may be a patient responsible charge related to this service. The patient expressed understanding and agreed to proceed.   Other persons participating in the visit and their role in the encounter: none   Patients location: home  Providers location: clinic   Chief Complaint: Iron Deficiency  ID: [id number] OB: [birth year]  MR#: [id number]  QIO#:[id number]  Patient Care Team: [name], MD as PCP - General (Family Medicine) Mariah Milling, Tollie Pizza, MD as PCP - Cardiology (Cardiology) Lonell Face, MD as Consulting Physician (Neurology) Mady Haagensen, MD as Consulting Physician (Nephrology) Jeralyn Ruths, MD as Consulting Physician (Oncology) Poggi, Excell Seltzer, MD as Consulting Physician (Orthopedic Surgery) Otelia Sergeant, NP (Inactive) as Nurse Practitioner (Endocrinology) Rosetta Posner, DPM as Consulting Physician (Podiatry) Copland, Chucky May as Referring Physician (Obstetrics and Gynecology) Elwin Mocha, MD as Consulting Physician (Ophthalmology) Bud Face, MD as Referring Physician (Otolaryngology) Gaspar Cola, [facility] (Pharmacist) Levert Feinstein, MD as Consulting Physician (Neurology)   CHIEF COMPLAINT: Iron deficiency anemia  INTERVAL HISTORY: Patient returns to clinic today for repeat laboratory work, further evaluation, and consideration of additional IV Venofer.  She feels ok after recent admission for seizure, her  first. She has started new medication which makes her tired. She has mild weakness and fatigue, but otherwise feels well.  She has had no further GI bleed. Denies any neurologic complaints. Denies recent fevers or illnesses. Denies any easy bleeding or bruising. No melena or hematochezia. No pica or restless leg. Reports good appetite and denies weight loss. Denies chest pain. Denies any nausea, vomiting, constipation, or diarrhea. Denies urinary complaints. Patient offers no further specific complaints today.   REVIEW OF SYSTEMS:   Review of Systems  Constitutional:  Positive for malaise/fatigue. Negative for fever and weight loss.  Respiratory:  Negative for cough and shortness of breath.   Cardiovascular:  Negative for chest pain, palpitations and leg swelling.  Gastrointestinal:  Negative for blood in stool, diarrhea and melena.  Genitourinary:  Negative for hematuria.  Musculoskeletal:  Negative for back pain.  Skin: Negative.  Negative for rash.  Neurological:  Negative for dizziness, sensory change, focal weakness, weakness and headaches.  Psychiatric/Behavioral: Negative.  The patient is not nervous/anxious.    As per HPI. Otherwise, a complete review of systems is negative.  PAST MEDICAL HISTORY: Past Medical History:  Diagnosis Date   Acute colitis 01/27/2017   Acute pyelonephritis    Acute upper GI bleed 01/25/2020   Anemia    iron deficiency anemia   Aortic arch aneurysm    BRCA negative 2014   CAD (coronary artery disease)    a. 08/2003 Cath: LAD 30-40-med Rx; b. 11/2014 PCI: LAD 33m (3.25x23 Xience Alpine DES); c. 06/2015 PCI: D1 (2.25x12 Resolute Integrity DES); d. 06/2017 PCI: Patent mLAD stent, D2 95 (PTCA); e. 09/2017 PCI: D2 99ost (CBA); d. 12/2017 Cath: LM nl, LAD 29m, 80d (small), D1 40ost, D2 95ost, LCX 40p, RCA 40ost/p->Med rx for D2 given restenosis.   Closed nondisplaced intertrochanteric fracture of  left femur (HCC) 10/12/2018   Colitis 06/03/2015   Colon polyp     CVA (cerebral vascular accident) (HCC)    Left side weakness.    Degenerative tear of glenoid labrum of right shoulder 03/15/2017   Diabetes mellitus without complication (HCC)    Family history of breast cancer    BRCA neg 2014   Femur fracture, left (HCC) 10/12/2018   Gastric ulcer 04/27/2011   Helicobacter pylori infection 11/23/2014   History of echocardiogram    a. 03/2017 Echo: EF 60-65%, no rwma; b. 02/2018 Echo: EF 60-65%, no rwma. Nl RV fxn. No cardiac source of emboli (admitted w/ stroke).   Hypertension    Iron deficiency anemia secondary to blood loss (chronic)    Malignant melanoma of skin of scalp (HCC)    MI, acute, non ST segment elevation (HCC)    Neuromuscular disorder (HCC)    S/P drug eluting coronary stent placement 06/04/2015   Sepsis (HCC) 03/18/2017   Sepsis secondary to UTI (HCC) 04/23/2020   Stroke (HCC)    a. 02/2018 MRI: 7mm late acute/early subacute L medial frontal lobe inarct; b. 02/2018 MRA No large vessel occlusion or aneurysm. Mod to sev L P2 stenosis. thready L vertebral artery, diffusely dzs'd; c. 02/2018 Carotid U/S: <50% bilat ICA dzs.   Unspecified atrial fibrillation (HCC) 04/23/2020    PAST SURGICAL HISTORY: Past Surgical History:  Procedure Laterality Date   APPENDECTOMY     BALLOON ENTEROSCOPY  02/06/2020   [facility]   BIOPSY N/A 03/14/2020   Procedure: BIOPSY;  Surgeon: Midge Minium, MD;  Location: [facility];  Service: Endoscopy;  Laterality: N/A;   CARDIAC CATHETERIZATION N/A 11/09/2014   Procedure: Coronary Angiography;  Surgeon: Antonieta Iba, MD;  Location: [facility] INVASIVE CV LAB;  Service: Cardiovascular;  Laterality: N/A;   CARDIAC CATHETERIZATION N/A 11/12/2014   Procedure: Coronary Stent Intervention;  Surgeon: Marcina Millard, MD;  Location: [facility] IN[facility] CV LAB;  Service: Cardiovascular;  Laterality: N/A;   CARDIAC CATHETERIZATION N/A 04/18/2015   Procedure: Left Heart Cath and Coronary Angiography;  Surgeon: Antonieta Iba, MD;   Location: [facility] INVAS[facility] CV LAB;  Service: Cardiovascular;  Laterality: N/A;   CARDIAC CATHETERIZATION Left 06/04/2015   Procedure: Left Heart Cath and Coronary Angiography;  Surgeon: Iran Ouch, MD;  Location: [facility] CV LAB;  Service: Cardiovascular;  Laterality: Left;   CARDIAC CATHETERIZATION N/A 06/04/2015   Procedure: Coronary Stent Intervention;  Surgeon: Iran Ouch, MD;  Location: [facility];  Service: Cardiovascular;  Laterality: N/A;   CESAREAN SECTION  2001   CHOLECYSTECTOMY N/A 11/18/2016   Procedure: LAPAROSCOPIC CHOLECYSTECTOMY WITH INTRAOPERATIVE CHOLANGIOGRAM;  Surgeon: Kieth Brightly, MD;  Location: [facility] ORS;  Service: General;  Laterality: N/A;   COLONOSCOPY WITH PROPOFOL N/A 04/27/2016   Procedure: COLONOSCOPY WITH PROPOFOL;  Surgeon: Midge Minium, MD;  Location: [facility];  Service: Endoscopy;  Laterality: N/A;   COLONOSCOPY WITH PROPOFOL N/A 01/12/2018   Procedure: COLONOSCOPY WITH PROPOFOL;  Surgeon: Toledo, Boykin Nearing, MD;  Location: [facility] ENDOSCOPY;  Service: Endoscopy;  Laterality: N/A;   COLONOSCOPY WITH PROPOFOL N/A 03/14/2020   Procedure: COLONOSCOPY WITH PROPOFOL;  Surgeon: Midge Minium, MD;  Location: [facility];  Service: Endoscopy;  Laterality: N/A;  Needs to be scheduled after 7:30 due to driver issues   CORONARY ANGIOPLASTY     CORONARY BALLOON ANGIOPLASTY N/A 06/29/2017   Procedure: CORONARY BALLOON ANGIOPLASTY;  Surgeon: Iran Ouch, MD;  Location: [facility];  Service: Cardiovascular;  Laterality: N/A;   CORONARY BALLOON ANGIOPLASTY N/A 09/20/2017   Procedure: CORONARY BALLOON ANGIOPLASTY;  Surgeon: Iran Ouch, MD;  Location: [facility];  Service: Cardiovascular;  Laterality: N/A;   CORONARY STENT INTERVENTION N/A 12/13/2019   Procedure: CORONARY STENT INTERVENTION;  Surgeon: Iran Ouch, MD;  Location: [facility] CV [facility];  Service: Cardiovascular;  Laterality: N/A;   DILATION  AND CURETTAGE OF UTERUS     ESOPHAGOGASTRODUODENOSCOPY (EGD) WITH PROPOFOL N/A 09/14/2014   Procedure: ESOPHAGOGASTRODUODENOSCOPY (EGD) WITH PROPOFOL;  Surgeon: Elnita Maxwell, MD;  Location: [facility] END[facility]COPY;  Service: Endoscopy;  Laterality: N/A;   ESOPHAGOGASTRODUODENOSCOPY (EGD) WITH PROPOFOL N/A 04/27/2016   Procedure: ESOPHAGOGASTRODUODENOSCOPY (EGD) WITH PROPOFOL;  Surgeon: Midge Minium, MD;  Location: [facility];  Service: Endoscopy;  Laterality: N/A;  Diabetic - oral meds   ESOPHAGOGASTRODUODENOSCOPY (EGD) WITH PROPOFOL N/A 01/12/2018   Procedure: ESOPHAGOGASTRODUODENOSCOPY (EGD) WITH PROPOFOL;  Surgeon: Toledo, Boykin Nearing, MD;  Location: [facility];  Service: Endoscopy;  Laterality: N/A;   ESOPHAGOGASTRODUODENOSCOPY (EGD) WITH PROPOFOL N/A 04/11/2019   Procedure: ESOPHAGOGASTRODUODENOSCOPY (EGD) WITH PROPOFOL;  Surgeon: Midge Minium, MD;  Location: [facility];  Service: Endoscopy;  Laterality: N/A;   ESOPHAGOGASTRODUODENOSCOPY (EGD) WITH PROPOFOL N/A 03/08/2020   Procedure: ESOPHAGOGASTRODUODENOSCOPY (EGD) WITH PROPOFOL;  Surgeon: Midge Minium, MD;  Location: [facility];  Service: Endoscopy;  Laterality: N/A;   GASTRIC BYPASS  09/2009   [facility]    INTRAMEDULLARY (IM) NAIL INTERTROCHANTERIC Left 10/13/2018   Procedure: INTRAMEDULLARY (IM) NAIL INTERTROCHANTRIC;  Surgeon: Tarry Kos, MD;  Location: [facility] OR;  Service: Orthopedics;  Laterality: Left;   Left Carotid to sublcavian artery bypass w/ subclavian artery ligation     a. Performed @ [facility].   LEFT HEART CATH AND CORONARY ANGIOGRAPHY Left 06/29/2017   Procedure: LEFT HEART CATH AND CORONARY ANGIOGRAPHY;  Surgeon: Iran Ouch, MD;  Location: [facility];  Service: Cardiovascular;  Laterality: Left;   LEFT HEART CATH AND CORONARY ANGIOGRAPHY N/A 09/20/2017   Procedure: LEFT HEART CATH AND CORONARY ANGIOGRAPHY;  Surgeon: Iran Ouch, MD;  Location: [facility];  Service:  Cardiovascular;  Laterality: N/A;   LEFT HEART CATH AND CORONARY ANGIOGRAPHY N/A 12/20/2017   Procedure: LEFT HEART CATH AND CORONARY ANGIOGRAPHY;  Surgeon: Iran Ouch, MD;  Location: [facility];  Service: Cardiovascular;  Laterality: N/A;   LEFT HEART CATH AND CORONARY ANGIOGRAPHY N/A 04/20/2019   Procedure: LEFT HEART CATH AND CORONARY ANGIOGRAPHY possible PCI;  Surgeon: Antonieta Iba, MD;  Location: [facility];  Service: Cardiovascular;  Laterality: N/A;   LEFT HEART CATH AND CORONARY ANGIOGRAPHY N/A 12/13/2019   Procedure: LEFT HEART CATH AND CORONARY ANGIOGRAPHY;  Surgeon: Iran Ouch, MD;  Location: [facility];  Service: Cardiovascular;  Laterality: N/A;   MELANOMA EXCISION  2016   Dr. Marinell Blight ABLATION  2002   RIGHT OOPHORECTOMY     SHOULDER ARTHROSCOPY WITH OPEN ROTATOR CUFF REPAIR Right 01/07/2016   Procedure: SHOULDER ARTHROSCOPY WITH DEBRIDMENT, SUBACHROMIAL DECOMPRESSION;  Surgeon: Christena Flake, MD;  Location: [facility] ORS;  Service: Orthopedics;  Laterality: Right;   SHOULDER ARTHROSCOPY WITH OPEN ROTATOR CUFF REPAIR Right 03/16/2017   Procedure: SHOULDER ARTHROSCOPY WITH OPEN ROTATOR CUFF REPAIR POSSIBLE BICEPS TENODESIS;  Surgeon: Christena Flake, MD;  Location: [facility] ORS;  Service: Orthopedics;  Laterality: Right;   TRIGGER FINGER RELEASE Right     [place]  FAMILY HISTORY Family History  Problem Relation Age of Onset   Hypertension Mother    Anxiety disorder Mother    Depression Mother    Bipolar disorder Mother    Heart disease Mother        No details   Hyperlipidemia Mother    Kidney disease Father    Heart disease Father 52   Hypertension Father    Diabetes Father    Stroke Father    Colon cancer Father        dx in his 30's   Anxiety disorder Father    Depression Father    Skin cancer Father    Kidney disease Sister    Thyroid nodules Sister    Hypertension Sister    Hypertension Sister    Diabetes  Sister    Hyperlipidemia Sister    Depression Sister    Breast cancer Maternal Aunt 36   Breast cancer Maternal Aunt 50   Ovarian cancer Cousin    Colon cancer Cousin    Kidney cancer Cousin    Breast cancer Other    Bladder Cancer Neg Hx        ADVANCED DIRECTIVES:    HEALTH MAINTENANCE: Social History   Tobacco Use   Smoking status: Former    Packs/day: 0.25    Years: 1.00    Pack years: 0.25    Types: Cigarettes    Quit date: 08/31/1994    Years since quitting: 26.5   Smokeless tobacco: Never   Tobacco comments:    quit 28 years ago  Vaping Use   Vaping Use: Never used  Substance Use Topics   Alcohol use: No    Alcohol/week: 0.0 standard drinks   Drug use: No     Allergies  Allergen Reactions   Lipitor [Atorvastatin] Other (See Comments)    Leg pains    Current Outpatient Medications  Medication Sig Dispense Refill   AIMOVIG 140 MG/ML SOAJ Inject 140 mg into the skin every 28 (twenty-eight) days.     ALPRAZolam (XANAX) 1 MG tablet Take 1 tablet by mouth twice daily (Patient taking differently: Take 1 mg by mouth at bedtime.) 60 tablet 5   aspirin EC 81 MG EC tablet Take 1 tablet (81 mg total) by mouth daily. Swallow whole. 30 tablet 1   Baclofen 5 MG TABS Take 1 tablet by mouth 2 (two) times daily as needed (muscle spasms).     carvedilol (COREG) 12.5 MG tablet Take 1 tablet by mouth twice daily 60 tablet 11   clopidogrel (PLAVIX) 75 MG tablet Take 1 tablet (75 mg total) by mouth daily. 90 tablet 3   desvenlafaxine (PRISTIQ) 50 MG 24 hr tablet Take 50 mg by mouth daily.     diclofenac Sodium (VOLTAREN) 1 % GEL Apply 2 g topically 4 (four) times daily.     diphenoxylate-atropine (LOMOTIL) 2.5-0.025 MG tablet Take 1 tablet by mouth 4 (four) times daily as needed for diarrhea or loose stools. 120 tablet 5   estradiol (ESTRACE) 0.1 MG/GM vaginal cream Estrogen Cream Instruction Discard applicator Apply pea sized amount to tip of finger to urethra before bed. Wash  hands well after application. Use Monday, Wednesday and Friday (Patient taking differently: Place 1 Applicatorful vaginally 3 (three) times a week. Estrogen Cream Instruction Discard applicator Apply pea sized amount to tip of finger to urethra before bed. Wash hands well after application. Use Monday, Wednesday and Friday) 42.5 g 12   furosemide (LASIX) 20 MG tablet Take  1 tablet by mouth every day as needed for swelling (Patient taking differently: Take 20 mg by mouth daily as needed (swelling).) 30 tablet 6   gabapentin (NEURONTIN) 300 MG capsule Take 2 capsules (600 mg total) by mouth 2 (two) times daily. 360 capsule 4   glucose 4 GM chewable tablet Chew 1 tablet (4 g total) by mouth 3 (three) times daily as needed for low blood sugar. Home med. 50 tablet 12   hydrALAZINE (APRESOLINE) 50 MG tablet Take 1 tablet (50 mg total) by mouth 3 (three) times daily. 90 tablet 0   isosorbide mononitrate (IMDUR) 120 MG 24 hr tablet Take 1 tablet by mouth every day 30 tablet 3   lacosamide (VIMPAT) 50 MG TABS tablet Take 1 tablet (50 mg total) by mouth 2 (two) times daily. 60 tablet 0   levETIRAcetam (KEPPRA) 750 MG tablet Take 2 tablets (1,500 mg total) by mouth 2 (two) times daily. 120 tablet 0   levothyroxine (SYNTHROID) 25 MCG tablet Take 1 tablet by mouth 30 minutes before breakfast (Patient taking differently: Take 25 mcg by mouth daily before breakfast.) 30 tablet 5   mirabegron ER (MYRBETRIQ) 50 MG TB24 tablet Take 1 tablet (50 mg total) by mouth daily. 30 tablet 11   nitrofurantoin (MACRODANTIN) 100 MG capsule Hold while on IV antibiotic. (Patient taking differently: Take 100 mg by mouth 2 (two) times daily.) 30 capsule 11   nitroGLYCERIN (NITROSTAT) 0.4 MG SL tablet Dissolve 1 tablet under tongue every , up to 3 doses, for chest pain. Max 3 doses in . (Patient taking differently: Place 0.4 mg under the tongue every 5 (five) minutes x 3 doses as needed for chest pain.) 25 tablet 11    pantoprazole (PROTONIX) 40 MG tablet Take 1 tablet (40 mg total) by mouth 2 (two) times daily. 180 tablet 1   Polyethyl Glycol-Propyl Glycol (SYSTANE OP) Place 1 drop into both eyes daily as needed (dry eyes).     promethazine (PHENERGAN) 25 MG tablet Take 1 tablet (25 mg total) by mouth every 6 (six) hours as needed for nausea or vomiting. 30 tablet 11   Rimegepant Sulfate (NURTEC) 75 MG TBDP Take 75 mg by mouth as needed. (Patient taking differently: Take 75 mg by mouth daily as needed (migraine).) 12 tablet 11   rosuvastatin (CRESTOR) 40 MG tablet Take 1 tablet (40 mg total) by mouth daily at 6 PM. 90 tablet 3   traZODone (DESYREL) 100 MG tablet Take 1 to 2 tablets by mouth at bedtime 30 tablet 11   No current facility-administered medications for this visit.    OBJECTIVE: There were no vitals filed for this visit.    There is no height or weight on file to calculate BMI.      ECOG FS:1 - Symptomatic but completely ambulatory  GENERAL: Patient is a well appearing female in no acute distress LUNGS:  no obvious respiratory distress NEURO:  flat affect   LAB RESULTS:  Lab Results  Component Value Date   NA 140 02/18/2021   K 3.6 02/18/2021   CL 101 02/18/2021   CO2 24 02/18/2021   GLUCOSE 225 (H) 02/18/2021   BUN 19 02/18/2021   CREATININE 1.83 (H) 02/18/2021   CALCIUM 9.3 02/18/2021   PROT 6.9 02/18/2021   ALBUMIN 4.4 02/18/2021   AST 22 02/18/2021   ALT 28 02/18/2021   ALKPHOS 158 (H) 02/18/2021   BILITOT <0.2 02/18/2021   GFRNONAA 37 (L) 02/11/2021   GFRAA 29 (L) 03/13/2020  Lab Results  Component Value Date   WBC 8.9 02/26/2021   NEUTROABS 6.2 02/26/2021   HGB 12.1 02/26/2021   HCT 38.1 02/26/2021   MCV 86.0 02/26/2021   PLT 144 (L) 02/26/2021   Lab Results  Component Value Date   IRON 62 02/26/2021   TIBC 281 02/26/2021   IRONPCTSAT 22 02/26/2021    Lab Results  Component Value Date   FERRITIN 72 02/26/2021     STUDIES: CT ANGIO HEAD NECK W WO  CM  Result Date: 02/07/2021 CLINICAL DATA:  Encephalopathy EXAM: CT ANGIOGRAPHY HEAD AND NECK TECHNIQUE: Multidetector CT imaging of the head and neck was performed using the standard protocol during bolus administration of intravenous contrast. Multiplanar CT image reconstructions and MIPs were obtained to evaluate the vascular anatomy. Carotid stenosis measurements (when applicable) are obtained utilizing NASCET criteria, using the distal internal carotid diameter as the denominator. CONTRAST:  75mL OMNIPAQUE IOHEXOL 350 MG/ML SOLN COMPARISON:  12/05/2020 FINDINGS: CT HEAD FINDINGS Brain: There is no mass, hemorrhage or extra-axial collection. The size and configuration of the ventricles and extra-axial CSF spaces are normal. There is an old right basal ganglia small vessel infarct. There is hypoattenuation of the periventricular white matter, most commonly indicating chronic ischemic microangiopathy. Skull: The visualized skull base, calvarium and extracranial soft tissues are normal. Sinuses/Orbits: No fluid levels or advanced mucosal thickening of the visualized paranasal sinuses. No mastoid or middle ear effusion. The orbits are normal. CTA NECK FINDINGS SKELETON: There is no bony spinal canal stenosis. No lytic or blastic lesion. OTHER NECK: Normal pharynx, larynx and major salivary glands. No cervical lymphadenopathy. Unremarkable thyroid gland. UPPER CHEST: Multifocal ground-glass opacity in the right lung apex. AORTIC ARCH: There is calcific atherosclerosis of the aortic arch. There is no aneurysm, dissection or hemodynamically significant stenosis of the visualized portion of the aorta. There is a right-sided aortic arch with aberrant left subclavian artery, which is surgically occluded. Patent graft between the left common carotid and left subclavian arteries. RIGHT CAROTID SYSTEM: No dissection, occlusion or aneurysm. Mild atherosclerotic calcification at the carotid bifurcation without hemodynamically  significant stenosis. LEFT CAROTID SYSTEM: Early bifurcation. No stenosis or other abnormality. VERTEBRAL ARTERIES: Right dominant configuration. Both origins are clearly patent. There is no dissection, occlusion or flow-limiting stenosis to the skull base (V1-V3 segments). CTA HEAD FINDINGS POSTERIOR CIRCULATION: --Vertebral arteries: Bilateral atherosclerotic calcification. --Inferior cerebellar arteries: Normal. --Basilar artery: Normal. --Superior cerebellar arteries: Normal. --Posterior cerebral arteries (PCA): Severe stenosis of the distal P2 segment of the left PCA, which remains patent distally. The right PCA is predominantly supplied by the posterior communicating artery. ANTERIOR CIRCULATION: --Intracranial internal carotid arteries: Atherosclerotic calcification of the internal carotid arteries at the skull base without hemodynamically significant stenosis. --Anterior cerebral arteries (ACA): Normal. Absent left A1 segment, normal variant --Middle cerebral arteries (MCA): Mild narrowing of the distal left M1 segment. Normal right MCA. VENOUS SINUSES: As permitted by contrast timing, patent. Review of the MIP images confirms the above findings. IMPRESSION: 1. No emergent large vessel occlusion. 2. Unchanged severe stenosis of the distal P2 segment of the left posterior cerebral artery, which remains patent distally. 3. Right-sided aortic arch with aberrant left subclavian artery, which is surgically occluded. Patent graft between the left common carotid and left subclavian arteries. 4. Multifocal ground-glass opacity in the right lung apex, which may indicate pneumonia. Aortic Atherosclerosis (ICD10-I70.0). Electronically Signed   By: Deatra Robinson M.D.   On: 02/07/2021 21:19   MR BRAIN WO CONTRAST  Result  Date: 02/11/2021 CLINICAL DATA:  Initial evaluation for acute seizure. EXAM: MRI HEAD WITHOUT CONTRAST TECHNIQUE: Multiplanar, multiecho pulse sequences of the brain and surrounding structures were  obtained without intravenous contrast. COMPARISON:  Previous CTA from 02/07/2021 and MRI from 12/05/2020. FINDINGS: Brain: Mildly advanced cerebral atrophy for age. Extensive patchy and confluent T2/FLAIR hyperintensity involving the periventricular and deep white matter both cerebral hemispheres as well as the pons, most consistent with chronic microvascular ischemic disease, fairly advanced in nature. Multiple scattered superimposed remote lacunar infarcts present about the hemispheric cerebral white matter, basal ganglia, thalami, and pons. Remote lacunar infarct present at the right middle cerebellar peduncle. Small remote cortical to subcortical infarct noted at the left parieto-occipital region. Subtle 1.6 cm focus of curvilinear diffusion abnormality involving the deep white matter of the posterior left frontal centrum semi ovale (series 3, image 41). No visible ADC correlate. Finding suspected to reflect a small focus of evolving subacute small vessel ischemic change, although this could potentially reflect T2 shine through. Otherwise, no other diffusion abnormality to suggest acute or subacute ischemia or changes related to seizure. Gray-white matter differentiation otherwise maintained. No other areas of chronic cortical infarction. No acute intracranial hemorrhage. Single probable punctate focus of chronic microhemorrhage noted at the right temporal region, of doubtful significance in isolation. No mass lesion, midline shift or mass effect. No hydrocephalus or extra-axial fluid collection. Pituitary gland suprasellar region normal. Midline structures intact. No intrinsic temporal lobe abnormality. Vascular: Major intracranial vascular flow voids are maintained. Skull and upper cervical spine: Craniocervical junction within normal limits. Bone marrow signal intensity normal. No scalp soft tissue abnormality. Sinuses/Orbits: Patient status post bilateral ocular lens replacement. Globes and orbital soft  tissues demonstrate no acute finding. Paranasal sinuses are largely clear. No mastoid effusion. Inner ear structures grossly normal. Other: None. IMPRESSION: 1. 1.6 cm focus of curvilinear diffusion abnormality involving the deep white matter of the posterior left frontal centrum semi ovale as above. While this could potentially reflect a small focus of artifact/T2 shine through, a possible evolving subacute small vessel ischemic infarct could be considered in the correct clinical setting. 2. No other acute intracranial abnormality. 3. Age-related cerebral atrophy with advanced chronic microvascular ischemic disease, with multiple remote lacunar infarcts as above. Electronically Signed   By: Rise Mu M.D.   On: 02/11/2021 03:05   DG Chest Portable 1 View  Result Date: 02/07/2021 CLINICAL DATA:  Altered level of consciousness, weakness EXAM: PORTABLE CHEST 1 VIEW COMPARISON:  03/31/2020 FINDINGS: Single frontal view of the chest demonstrates an unremarkable cardiac silhouette. No acute airspace disease, effusion, or pneumothorax. No acute bony abnormalities. IMPRESSION: 1. No acute intrathoracic process. Electronically Signed   By: Sharlet Salina M.D.   On: 02/07/2021 21:48   EEG adult  Result Date: 02/08/2021 Rejeana Brock, MD     02/08/2021  7:43 PM History: 51 yo F with altered mental status Sedation: Ativan given during study Technique: This 70 minute video EEG was acquired with electrodes placed according to the International 10-20 electrode system (including Fp1, Fp2, F3, F4, C3, C4, P3, P4, O1, O2, T3, T4, T5, T6, A1, A2, Fz, Cz, Pz). The following electrodes were missing or displaced: none. Background: The background consists of generalized 5 -6 Hz theta activity with superimposed bifrontally predominant sharp wave discharges varying between 2 - 3 Hz, occasionally interspersed with a run of 5-6 hz activity. Following ativan administration, this activity markedly improves, though  throughout the study there continues to be occasional bifrontal  sharp wave discharges. The EEG improvement was accompanied by clinical improvement with the patient beginning to speak Photic stimulation: Physiologic driving is not performed EEG Abnormalities: 1) Frontal status epilepticus Clinical Interpretation: This EEG recorded evidence of focal frontal status epilepticus  with resolution following ativan administration. Ritta Slot, MD [facility] [phone] If 7pm- 7am, please page neurology on call as listed in [facility].   Overnight EEG with video  Result Date: 02/09/2021 Charlsie Quest, MD     02/10/2021  9:51 AM Patient Name: [name] MRN: [id number] Epilepsy Attending: Charlsie Quest Referring Physician/Provider: Dr Erick Blinks Duration: 02/08/2021 2347 to 02/09/2021 2347 Patient history: 51 yo F with altered mental status, routine eeg showed status epilepticus. LTM eeg to evaluate for seizure Level of alertness: lethargic AEDs during EEG study: Ativan Technical aspects: This EEG study was done with scalp electrodes positioned according to the 10-20 International system of electrode placement. Electrical activity was acquired at a sampling rate of 500Hz  and reviewed with a high frequency filter of 70Hz  and a low frequency filter of 1Hz . EEG data were recorded continuously and digitally stored. Description: The posterior dominant rhythm consists of 7.5 Hz activity of moderate voltage (25-35 uV) seen predominantly in posterior head regions, symmetric and reactive to eye opening and eye closing. EEG showed continuous generalized 5 to 7 Hz theta slowing. Intermittent generalized and maximal bifrontal periodic discharges were noted at 2-2.5Hz . Hyperventilation and photic stimulation were not performed.   ABNORMALITY - Periodic epileptiform discharges, generalized and maximal bifrontal( GPDs) - Continuous slow, generalized IMPRESSION: This study showed generalized and maximal  bifrontal epileptiform discharges at 2-2.5Hz  which is on the ictal-interictal continuum with high potential for seizure recurrence. Additionally, EEG is suggestive of moderate diffuse encephalopathy, nonspecific etiology. No definite seizures were seen throughout the recording. Priyanka Annabelle Harman     ASSESSMENT: Iron deficiency anemia.  PLAN:    1. Iron deficiency anemia: Patient continues to have a mildly decreased hemoglobin and iron stores.  She is also symptomatic.  She has had no further GI bleeds.  She likely has poor absorption given her history of gastric bypass surgery.  Previously, her entire anemia work-up was either negative or within normal limits.  Feraheme was switched to Venofer for secondary to insurance reasons.  Labs today have improved and she is no longer anemic. Ferritin and iron studies have normalized.     2.  Ulcer: Patient does not report any active bleeding.  Continue follow-up with GI as indicated.  3.  Chronic renal insufficiency: Cr 1.83 with pcp on 02/19/20. Monitor.   4. Thrombocytopenia- plt count 144. Previously 114. Monitor.   5. Seizure- unclear etiology. S/p hospitalization. Working with neurology now.   Return to clinic  No iron today 3 mo- labs (cbc, ferritin, iron studies) Day to week later see Irving Copas, +/- venofer- la   I discussed the assessment and treatment plan with the patient. The patient was provided an opportunity to ask questions and all were answered. The patient agreed with the plan and demonstrated an understanding of the instructions.   The patient was advised to call back or seek an in-person evaluation if the symptoms worsen or if the condition fails to improve as anticipated.   I spent 15 minutes face-to-face video visit time dedicated to the care of this patient on the date of this encounter to include pre-visit review of hematology notes, labs,  face-to-face time with the patient, and post visit ordering of testing/documentation.  Patient expressed understanding and was in agreement with this plan. She also understands that She can call clinic at any time with any questions, concerns, or complaints.   Alinda Dooms, NP   02/27/2021

## 2021-02-28 ENCOUNTER — Encounter: Payer: Self-pay | Admitting: Neurology

## 2021-03-03 ENCOUNTER — Telehealth: Payer: Self-pay | Admitting: Cardiovascular Disease

## 2021-03-03 NOTE — Telephone Encounter (Signed)
Last read by Sinda Du at  [place] PM on 02/28/2021

## 2021-03-03 NOTE — Telephone Encounter (Signed)
Patient is requesting to reschedule her CT scan appt for 03/11/21

## 2021-03-04 NOTE — Telephone Encounter (Signed)
error 

## 2021-03-08 ENCOUNTER — Emergency Department
Admission: EM | Admit: 2021-03-08 | Discharge: 2021-03-08 | Disposition: A | Discharge status: Nursing Home (Includes ICF) | Payer: Medicare Other | Attending: Emergency Medicine | Admitting: Emergency Medicine

## 2021-03-08 ENCOUNTER — Emergency Department (HOSPITAL_COMMUNITY): Payer: Medicare Other

## 2021-03-08 ENCOUNTER — Inpatient Hospital Stay (HOSPITAL_COMMUNITY)
Admission: EM | Admit: 2021-03-08 | Discharge: 2021-03-20 | DRG: 101 | Disposition: A | Discharge status: Home Health | Payer: Medicare Other | Attending: Family Medicine | Admitting: Family Medicine

## 2021-03-08 ENCOUNTER — Other Ambulatory Visit: Payer: Self-pay

## 2021-03-08 ENCOUNTER — Emergency Department: Payer: Medicare Other

## 2021-03-08 DIAGNOSIS — E039 Hypothyroidism, unspecified: Secondary | ICD-10-CM | POA: Diagnosis present

## 2021-03-08 DIAGNOSIS — F419 Anxiety disorder, unspecified: Secondary | ICD-10-CM | POA: Diagnosis present

## 2021-03-08 DIAGNOSIS — Z823 Family history of stroke: Secondary | ICD-10-CM

## 2021-03-08 DIAGNOSIS — I4891 Unspecified atrial fibrillation: Secondary | ICD-10-CM | POA: Diagnosis present

## 2021-03-08 DIAGNOSIS — E1122 Type 2 diabetes mellitus with diabetic chronic kidney disease: Secondary | ICD-10-CM | POA: Diagnosis present

## 2021-03-08 DIAGNOSIS — I129 Hypertensive chronic kidney disease with stage 1 through stage 4 chronic kidney disease, or unspecified chronic kidney disease: Secondary | ICD-10-CM | POA: Diagnosis not present

## 2021-03-08 DIAGNOSIS — E119 Type 2 diabetes mellitus without complications: Secondary | ICD-10-CM | POA: Insufficient documentation

## 2021-03-08 DIAGNOSIS — Z794 Long term (current) use of insulin: Secondary | ICD-10-CM

## 2021-03-08 DIAGNOSIS — E1169 Type 2 diabetes mellitus with other specified complication: Secondary | ICD-10-CM | POA: Diagnosis present

## 2021-03-08 DIAGNOSIS — Z87891 Personal history of nicotine dependence: Secondary | ICD-10-CM

## 2021-03-08 DIAGNOSIS — Z6828 Body mass index (BMI) 28.0-28.9, adult: Secondary | ICD-10-CM

## 2021-03-08 DIAGNOSIS — Z87440 Personal history of urinary (tract) infections: Secondary | ICD-10-CM

## 2021-03-08 DIAGNOSIS — Z83438 Family history of other disorder of lipoprotein metabolism and other lipidemia: Secondary | ICD-10-CM

## 2021-03-08 DIAGNOSIS — N189 Chronic kidney disease, unspecified: Secondary | ICD-10-CM | POA: Diagnosis not present

## 2021-03-08 DIAGNOSIS — I2510 Atherosclerotic heart disease of native coronary artery without angina pectoris: Secondary | ICD-10-CM | POA: Diagnosis present

## 2021-03-08 DIAGNOSIS — F329 Major depressive disorder, single episode, unspecified: Secondary | ICD-10-CM | POA: Diagnosis present

## 2021-03-08 DIAGNOSIS — Z8249 Family history of ischemic heart disease and other diseases of the circulatory system: Secondary | ICD-10-CM

## 2021-03-08 DIAGNOSIS — Z789 Other specified health status: Secondary | ICD-10-CM | POA: Diagnosis not present

## 2021-03-08 DIAGNOSIS — Z818 Family history of other mental and behavioral disorders: Secondary | ICD-10-CM

## 2021-03-08 DIAGNOSIS — G629 Polyneuropathy, unspecified: Secondary | ICD-10-CM

## 2021-03-08 DIAGNOSIS — Z9884 Bariatric surgery status: Secondary | ICD-10-CM

## 2021-03-08 DIAGNOSIS — I82622 Acute embolism and thrombosis of deep veins of left upper extremity: Secondary | ICD-10-CM | POA: Diagnosis present

## 2021-03-08 DIAGNOSIS — E440 Moderate protein-calorie malnutrition: Secondary | ICD-10-CM | POA: Diagnosis present

## 2021-03-08 DIAGNOSIS — I69354 Hemiplegia and hemiparesis following cerebral infarction affecting left non-dominant side: Secondary | ICD-10-CM | POA: Diagnosis not present

## 2021-03-08 DIAGNOSIS — Z79890 Hormone replacement therapy (postmenopausal): Secondary | ICD-10-CM

## 2021-03-08 DIAGNOSIS — G40911 Epilepsy, unspecified, intractable, with status epilepticus: Secondary | ICD-10-CM | POA: Diagnosis not present

## 2021-03-08 DIAGNOSIS — R45851 Suicidal ideations: Secondary | ICD-10-CM | POA: Diagnosis present

## 2021-03-08 DIAGNOSIS — I152 Hypertension secondary to endocrine disorders: Secondary | ICD-10-CM | POA: Diagnosis present

## 2021-03-08 DIAGNOSIS — Z8041 Family history of malignant neoplasm of ovary: Secondary | ICD-10-CM

## 2021-03-08 DIAGNOSIS — R569 Unspecified convulsions: Secondary | ICD-10-CM | POA: Diagnosis not present

## 2021-03-08 DIAGNOSIS — Z8719 Personal history of other diseases of the digestive system: Secondary | ICD-10-CM

## 2021-03-08 DIAGNOSIS — Z85820 Personal history of malignant melanoma of skin: Secondary | ICD-10-CM

## 2021-03-08 DIAGNOSIS — R4182 Altered mental status, unspecified: Secondary | ICD-10-CM | POA: Diagnosis present

## 2021-03-08 DIAGNOSIS — R262 Difficulty in walking, not elsewhere classified: Secondary | ICD-10-CM | POA: Diagnosis present

## 2021-03-08 DIAGNOSIS — Z7982 Long term (current) use of aspirin: Secondary | ICD-10-CM

## 2021-03-08 DIAGNOSIS — D500 Iron deficiency anemia secondary to blood loss (chronic): Secondary | ICD-10-CM | POA: Diagnosis present

## 2021-03-08 DIAGNOSIS — Z800 Family history of malignant neoplasm of digestive organs: Secondary | ICD-10-CM

## 2021-03-08 DIAGNOSIS — G9349 Other encephalopathy: Secondary | ICD-10-CM | POA: Diagnosis present

## 2021-03-08 DIAGNOSIS — Z841 Family history of disorders of kidney and ureter: Secondary | ICD-10-CM

## 2021-03-08 DIAGNOSIS — Z20822 Contact with and (suspected) exposure to covid-19: Secondary | ICD-10-CM | POA: Diagnosis present

## 2021-03-08 DIAGNOSIS — Z79899 Other long term (current) drug therapy: Secondary | ICD-10-CM

## 2021-03-08 DIAGNOSIS — N1832 Chronic kidney disease, stage 3b: Secondary | ICD-10-CM | POA: Diagnosis present

## 2021-03-08 DIAGNOSIS — I252 Old myocardial infarction: Secondary | ICD-10-CM

## 2021-03-08 DIAGNOSIS — Z8711 Personal history of peptic ulcer disease: Secondary | ICD-10-CM

## 2021-03-08 DIAGNOSIS — D649 Anemia, unspecified: Secondary | ICD-10-CM | POA: Diagnosis present

## 2021-03-08 DIAGNOSIS — N390 Urinary tract infection, site not specified: Secondary | ICD-10-CM

## 2021-03-08 DIAGNOSIS — E871 Hypo-osmolality and hyponatremia: Secondary | ICD-10-CM | POA: Diagnosis present

## 2021-03-08 DIAGNOSIS — M7989 Other specified soft tissue disorders: Secondary | ICD-10-CM | POA: Diagnosis not present

## 2021-03-08 DIAGNOSIS — Z8051 Family history of malignant neoplasm of kidney: Secondary | ICD-10-CM

## 2021-03-08 DIAGNOSIS — Z955 Presence of coronary angioplasty implant and graft: Secondary | ICD-10-CM

## 2021-03-08 DIAGNOSIS — Z7902 Long term (current) use of antithrombotics/antiplatelets: Secondary | ICD-10-CM

## 2021-03-08 DIAGNOSIS — Z4659 Encounter for fitting and adjustment of other gastrointestinal appliance and device: Secondary | ICD-10-CM

## 2021-03-08 DIAGNOSIS — G9389 Other specified disorders of brain: Secondary | ICD-10-CM | POA: Diagnosis present

## 2021-03-08 DIAGNOSIS — G40901 Epilepsy, unspecified, not intractable, with status epilepticus: Principal | ICD-10-CM | POA: Diagnosis present

## 2021-03-08 DIAGNOSIS — Z90721 Acquired absence of ovaries, unilateral: Secondary | ICD-10-CM

## 2021-03-08 DIAGNOSIS — Z833 Family history of diabetes mellitus: Secondary | ICD-10-CM

## 2021-03-08 DIAGNOSIS — E1159 Type 2 diabetes mellitus with other circulatory complications: Secondary | ICD-10-CM | POA: Diagnosis not present

## 2021-03-08 DIAGNOSIS — R1310 Dysphagia, unspecified: Secondary | ICD-10-CM | POA: Diagnosis present

## 2021-03-08 DIAGNOSIS — G9341 Metabolic encephalopathy: Secondary | ICD-10-CM | POA: Diagnosis present

## 2021-03-08 DIAGNOSIS — I161 Hypertensive emergency: Secondary | ICD-10-CM | POA: Diagnosis present

## 2021-03-08 DIAGNOSIS — Z888 Allergy status to other drugs, medicaments and biological substances status: Secondary | ICD-10-CM

## 2021-03-08 DIAGNOSIS — N319 Neuromuscular dysfunction of bladder, unspecified: Secondary | ICD-10-CM | POA: Diagnosis not present

## 2021-03-08 DIAGNOSIS — N179 Acute kidney failure, unspecified: Secondary | ICD-10-CM | POA: Diagnosis not present

## 2021-03-08 DIAGNOSIS — Z808 Family history of malignant neoplasm of other organs or systems: Secondary | ICD-10-CM

## 2021-03-08 DIAGNOSIS — Z803 Family history of malignant neoplasm of breast: Secondary | ICD-10-CM

## 2021-03-08 DIAGNOSIS — G9340 Encephalopathy, unspecified: Secondary | ICD-10-CM | POA: Diagnosis not present

## 2021-03-08 DIAGNOSIS — E873 Alkalosis: Secondary | ICD-10-CM | POA: Diagnosis present

## 2021-03-08 LAB — CBC
HCT: 38.6 % (ref 36.0–46.0)
Hemoglobin: 12 g/dL (ref 12.0–15.0)
MCH: 27.4 pg (ref 26.0–34.0)
MCHC: 31.1 g/dL (ref 30.0–36.0)
MCV: 88.1 fL (ref 80.0–100.0)
Platelets: 148 10*3/uL — ABNORMAL LOW (ref 150–400)
RBC: 4.38 MIL/uL (ref 3.87–5.11)
RDW: 15 % (ref 11.5–15.5)
WBC: 7.3 10*3/uL (ref 4.0–10.5)
nRBC: 0 % (ref 0.0–0.2)

## 2021-03-08 LAB — URINALYSIS, MICROSCOPIC (REFLEX): WBC, UA: >50 WBC/hpf (ref 0–5)

## 2021-03-08 LAB — COMPREHENSIVE METABOLIC PANEL
ALT: 24 U/L (ref 0–44)
AST: 25 U/L (ref 15–41)
Albumin: 3.1 g/dL — ABNORMAL LOW (ref 3.5–5.0)
Alkaline Phosphatase: 105 U/L (ref 38–126)
Anion gap: 7 (ref 5–15)
BUN: 21 mg/dL — ABNORMAL HIGH (ref 6–20)
CO2: 21 mmol/L — ABNORMAL LOW (ref 22–32)
Calcium: 8.7 mg/dL — ABNORMAL LOW (ref 8.9–10.3)
Chloride: 115 mmol/L — ABNORMAL HIGH (ref 98–111)
Creatinine, Ser: 1.58 mg/dL — ABNORMAL HIGH (ref 0.44–1.00)
GFR, Estimated: 39 mL/min — ABNORMAL LOW (ref >60)
Glucose, Bld: 97 mg/dL (ref 70–99)
Potassium: 4.6 mmol/L (ref 3.5–5.1)
Sodium: 143 mmol/L (ref 135–145)
Total Bilirubin: 0.5 mg/dL (ref 0.3–1.2)
Total Protein: 5.8 g/dL — ABNORMAL LOW (ref 6.5–8.1)

## 2021-03-08 LAB — URINALYSIS, ROUTINE W REFLEX MICROSCOPIC
Bilirubin Urine: NEGATIVE
Glucose, UA: NEGATIVE mg/dL
Hgb urine dipstick: NEGATIVE
Ketones, ur: NEGATIVE mg/dL
Nitrite: POSITIVE — AB
Protein, ur: 30 mg/dL — AB
Specific Gravity, Urine: 1.02 (ref 1.005–1.030)
pH: 6 (ref 5.0–8.0)

## 2021-03-08 LAB — RESP PANEL BY RT-PCR (FLU A&B, COVID) ARPGX2
Influenza A by PCR: NEGATIVE
Influenza B by PCR: NEGATIVE
SARS Coronavirus 2 by RT PCR: NEGATIVE

## 2021-03-08 LAB — BLOOD GAS, VENOUS
Acid-base deficit: 1.1 mmol/L (ref 0.0–2.0)
Bicarbonate: 25.3 mmol/L (ref 20.0–28.0)
O2 Saturation: 81.1 %
Patient temperature: 37
pCO2, Ven: 48 mmHg (ref 44.0–60.0)
pH, Ven: 7.33 (ref 7.250–7.430)
pO2, Ven: 49 mmHg — ABNORMAL HIGH (ref 32.0–45.0)

## 2021-03-08 LAB — TROPONIN I (HIGH SENSITIVITY): Troponin I (High Sensitivity): 13 ng/L (ref <18)

## 2021-03-08 MED ORDER — LEVETIRACETAM 500 MG/5ML IV SOLN: 2000.0000 mg | Freq: Once | INTRAVENOUS | Status: DC

## 2021-03-08 MED ORDER — POLYETHYLENE GLYCOL 3350 17 G PO PACK: 17.0000 g | PACK | Freq: Every day | ORAL | Status: DC | PRN

## 2021-03-08 MED ORDER — LEVETIRACETAM IN NACL 1000 MG/100ML IV SOLN: 1000.0000 mg | INTRAVENOUS | Status: DC

## 2021-03-08 MED ORDER — INSULIN ASPART 100 UNIT/ML IJ SOLN
0.0000 [IU] | INTRAMUSCULAR | Status: DC
  Administered 2021-03-09 – 2021-03-10 (×6): 1 [IU] via SUBCUTANEOUS
  Administered 2021-03-10 (×2): 2 [IU] via SUBCUTANEOUS
  Administered 2021-03-10 (×2): 1 [IU] via SUBCUTANEOUS
  Administered 2021-03-11: 3 [IU] via SUBCUTANEOUS
  Administered 2021-03-11 (×2): 2 [IU] via SUBCUTANEOUS

## 2021-03-08 MED ORDER — SODIUM CHLORIDE 0.9 % IV SOLN
200.0000 mg | INTRAVENOUS | Status: AC
  Administered 2021-03-08: 200 mg via INTRAVENOUS
  Filled 2021-03-08: qty 20

## 2021-03-08 MED ORDER — DOCUSATE SODIUM 100 MG PO CAPS: 100.0000 mg | ORAL_CAPSULE | Freq: Two times a day (BID) | ORAL | Status: DC | PRN

## 2021-03-08 MED ORDER — HEPARIN SODIUM (PORCINE) 5000 UNIT/ML IJ SOLN
5000.0000 [IU] | Freq: Three times a day (TID) | INTRAMUSCULAR | Status: DC
  Administered 2021-03-08 – 2021-03-20 (×35): 5000 [IU] via SUBCUTANEOUS
  Filled 2021-03-08 (×35): qty 1

## 2021-03-08 MED ORDER — PANTOPRAZOLE SODIUM 40 MG IV SOLR
40.0000 mg | Freq: Every day | INTRAVENOUS | Status: DC
  Administered 2021-03-08: 40 mg via INTRAVENOUS
  Filled 2021-03-08: qty 40

## 2021-03-08 MED ORDER — LEVETIRACETAM IN NACL 1000 MG/100ML IV SOLN
1000.0000 mg | Freq: Once | INTRAVENOUS | Status: AC
  Administered 2021-03-08: 1000 mg via INTRAVENOUS
  Filled 2021-03-08: qty 100

## 2021-03-08 MED ORDER — LORAZEPAM 2 MG/ML IJ SOLN
2.0000 mg | Freq: Once | INTRAMUSCULAR | Status: AC
  Administered 2021-03-08: 2 mg via INTRAVENOUS
  Filled 2021-03-08: qty 1

## 2021-03-08 MED ORDER — CEFTRIAXONE SODIUM 1 G IJ SOLR
1.0000 g | Freq: Once | INTRAMUSCULAR | Status: AC
  Administered 2021-03-08: 1 g via INTRAVENOUS
  Filled 2021-03-08: qty 10

## 2021-03-08 MED ORDER — SODIUM CHLORIDE 0.9 % IV SOLN
100.0000 mg | Freq: Two times a day (BID) | INTRAVENOUS | Status: DC
  Administered 2021-03-09: 100 mg via INTRAVENOUS
  Filled 2021-03-08 (×4): qty 10

## 2021-03-08 MED ORDER — LACTATED RINGERS IV SOLN: INTRAVENOUS | Status: DC

## 2021-03-08 NOTE — ED Notes (Signed)
Called [facility] for ED to ED transfer  spoke to [facility]

## 2021-03-08 NOTE — Progress Notes (Signed)
EEG complete - results pending 

## 2021-03-08 NOTE — Consult Note (Addendum)
NEUROLOGY CONSULTATION NOTE   Date of service: March 08, 2021 Patient Name: [name] MRN:  [id number] DOB:  02-Jan-1971 Reason for consult: "AMS, concern for subclinical status" Requesting Provider: Truddie Hidden, MD _ _ _   _ __   _ __ _ _  __ __   _ __   __ _  History of Present Illness  [name] is a 51 y.o. female with PMH significant for CAD, GI bleed, DM2, CKD, previous stroke with left sided weakness and progressive memory impairment, recent admission for subclinical status epilepticus discharged on Keppra 1550m BID and Vimpat 561mBID who presents with progressive confusion in the afternoon and nor responding well. She was in her usual state of health in the morning. She hada very similar presentation in Jan 2023 and was noted to be in status epilepticus.  On my evaluation she is very somnolent and unable to provide any meaningful history.  Most of the history obtained from chart review.  [facility] mg [facility] Keppra and case was discussed with neurology on, she was transferred for a continuous EEG.  She was loaded with an additional Keppra 1000 mg in the ED and Vimpat 200 mg IV once.  She was also given Ativan 2 mg.  I spoke to patient's son who reports that patient has been increasingly somnolent since her discharge in January and is not entirely sure if Keppra is working for her.  He believes that she is having breakthrough seizures but is unable to provide any specifics for me.  He does not endorse any clear provoking factors over the last few days.  No fevers, no chills, no signs of an upper respiratory or UTI, no gastroenteritis.  Patient does not drink alcohol, patient does not use any recreational substances, patient sleeps well and is not sleep deprived, no significant head injury with loss of consciousness.  No history of seizures or epilepsy in biological family.    ROS  Unable to obtain review of  system due to encephalopathy and somnolence.  Past History   Past Medical History:  Diagnosis Date   Acute colitis 01/27/2017   Acute pyelonephritis    Acute upper GI bleed 01/25/2020   Anemia    iron deficiency anemia   Aortic arch aneurysm    BRCA negative 2014   CAD (coronary artery disease)    a. 08/2003 Cath: LAD 30-40-med Rx; b. 11/2014 PCI: LAD 955m.25x23 Xience Alpine DES); c. 06/2015 PCI: D1 (2.25x12 Resolute Integrity DES); d. 06/2017 PCI: Patent mLAD stent, D2 95 (PTCA); e. 09/2017 PCI: D2 99ost (CBA); d. 12/2017 Cath: LM nl, LAD 72m107md (small), D1 40ost, D2 95ost, LCX 40p, RCA 40ost/p->Med rx for D2 given restenosis.   Closed nondisplaced intertrochanteric fracture of left femur (HCC)[place]9/2020   Colitis 06/03/2015   Colon polyp    CVA (cerebral vascular accident) (HCC)[place] Left side weakness.    Degenerative tear of glenoid labrum of right shoulder 03/15/2017   Diabetes mellitus without complication (HCC)[place] Family history of breast cancer    BRCA neg 2014   Femur fracture, left (HCC)[place]9/2020   Gastric ulcer 3/251/69/4503elicobacter pylori infection 11/23/2014   History of echocardiogram    a. 03/2017 Echo: EF 60-65%, no rwma; b. 02/2018 Echo: EF 60-65%, no rwma. Nl RV fxn. No cardiac source of emboli (admitted w/ stroke).   Hypertension  Iron deficiency anemia secondary to blood loss (chronic)    Malignant melanoma of skin of scalp (HCC)    MI, acute, non ST segment elevation (HCC)    Neuromuscular disorder (HCC)    S/P drug eluting coronary stent placement 06/04/2015   Sepsis ([place]) 03/18/2017   Sepsis secondary to UTI ([place]) 04/23/2020   Stroke ([place])    a. 02/2018 MRI: 36m late acute/early subacute L medial frontal lobe inarct; b. 02/2018 MRA No large vessel occlusion or aneurysm. Mod to sev L P2 stenosis. thready L vertebral artery, diffusely dzs'd; c. 02/2018 Carotid U/S: <50% bilat ICA dzs.   [place]ified [place] fibrillation [place] 04/23/2020   Past Surgical History:   Procedure Laterality Date   APPENDECTOMY     BALLOON ENTEROSCOPY  02/06/2020   [facility]   BIOPSY N/A 03/14/2020   Procedure: BIOPSY;  Surgeon: WLucilla Lame MD;  [place]  Service: Endoscopy;  Laterality: N/A;   CARDIAC CATHETERIZATION N/A 11/09/2014   Procedure: Coronary Angiography;  Surgeon: TMinna Merritts MD;  [place];  Service: Cardiovascular;  Laterality: N/A;   CARDIAC CATHETERIZATION N/A 11/12/2014   Procedure: Coronary Stent Intervention;  Surgeon: AIsaias Cowman MD;  [place] [facility];  Service: Cardiovascular;  Laterality: N/A;   CARDIAC CATHETERIZATION N/A 04/18/2015   Procedure: Left Heart Cath and Coronary Angiography;  Surgeon: TMinna Merritts MD;  [place] [facility];  Service: Cardiovascular;  Laterality: N/A;   CARDIAC CATHETERIZATION Left 06/04/2015   Procedure: Left Heart Cath and Coronary Angiography;  Surgeon: MWellington Hampshire MD;  [place] [facility];  Service: Cardiovascular;  Laterality: Left;   CARDIAC CATHETERIZATION N/A 06/04/2015   Procedure: Coronary Stent Intervention;  Surgeon: MWellington Hampshire MD;  [place];  Service: Cardiovascular;  Laterality: N/A;   CESAREAN SECTION  2001   CHOLECYSTECTOMY N/A 11/18/2016   Procedure: LAPAROSCOPIC CHOLECYSTECTOMY WITH INTRAOPERATIVE CHOLANGIOGRAM;  Surgeon: SChristene Lye MD;  [facility];  Service: General;  Laterality: N/A;   COLONOSCOPY WITH PROPOFOL N/A 04/27/2016   Procedure: COLONOSCOPY WITH PROPOFOL;  Surgeon: DLucilla Lame MD;  [place]  Service: Endoscopy;  Laterality: N/A;   COLONOSCOPY WITH PROPOFOL N/A 01/12/2018   Procedure: COLONOSCOPY WITH PROPOFOL;  Surgeon: Toledo, TBenay Pike MD;  Location[facility];  Service: Endoscopy;  Laterality: N/A;   COLONOSCOPY WITH PROPOFOL N/A 03/14/2020   Procedure: COLONOSCOPY WITH PROPOFOL;  Surgeon: WLucilla Lame MD;  [place]  Service: Endoscopy;  Laterality: N/A;  Needs to be scheduled after 7:30 due to driver issues   CORONARY ANGIOPLASTY     CORONARY BALLOON ANGIOPLASTY N/A 06/29/2017   Procedure: CORONARY BALLOON ANGIOPLASTY;  Surgeon: AWellington Hampshire MD;  [place];  Service: Cardiovascular;  Laterality: N/A;   CORONARY BALLOON ANGIOPLASTY N/A 09/20/2017   Procedure: CORONARY BALLOON ANGIOPLASTY;  Surgeon: AWellington Hampshire MD;  [place];  Service: Cardiovascular;  Laterality: N/A;   CORONARY STENT INTERVENTION N/A 12/13/2019   Procedure: CORONARY STENT INTERVENTION;  Surgeon: AWellington Hampshire MD;  [place];  Service: Cardiovascular;  Laterality: N/A;   DILATION AND CURETTAGE OF UTERUS     ESOPHAGOGASTRODUODENOSCOPY (EGD) WITH PROPOFOL N/A 09/14/2014   Procedure: ESOPHAGOGASTRODUODENOSCOPY (EGD) WITH PROPOFOL;  Surgeon: MJosefine Class MD;  [facility]  Service: Endoscopy;  Laterality: N/A;   ESOPHAGOGASTRODUODENOSCOPY (EGD) WITH PROPOFOL N/A 04/27/2016   Procedure: ESOPHAGOGASTRODUODENOSCOPY (  EGD) WITH PROPOFOL;  Surgeon: [name], MD;  Location: [place];  Service: Endoscopy;  Laterality: N/A;  Diabetic - oral meds   ESOPHAGOGASTRODUODENOSCOPY (EGD) WITH PROPOFOL N/A 01/12/2018   Procedure: ESOPHAGOGASTRODUODENOSCOPY (EGD) WITH PROPOFOL;  Surgeon: Toledo, Benay Pike, MD;  Location[facility];  Service: Endoscopy;  Laterality: N/A;   ESOPHAGOGASTRODUODENOSCOPY (EGD) WITH PROPOFOL N/A 04/11/2019   Procedure: ESOPHAGOGASTRODUODENOSCOPY (EGD) WITH PROPOFOL;  Surgeon: [name], MD;  Location: [facility];  Service: Endoscopy;  Laterality: N/A;   ESOPHAGOGASTRODUODENOSCOPY (EGD) WITH PROPOFOL N/A 03/08/2020   Procedure: ESOPHAGOGASTRODUODENOSCOPY (EGD) WITH PROPOFOL;  Surgeon: [name], MD;  [facility];  Service: Endoscopy;  Laterality: N/A;   GASTRIC BYPASS  09/2009   [facility]    INTRAMEDULLARY (IM) NAIL INTERTROCHANTERIC Left 10/13/2018   Procedure: INTRAMEDULLARY (IM) NAIL INTERTROCHANTRIC;  Surgeon: Leandrew Koyanagi, MD;  Location: [place];  Service: Orthopedics;  Laterality: Left;   Left Carotid to sublcavian artery bypass w/ subclavian artery ligation     a. Performed @ [facility].   LEFT HEART CATH AND CORONARY ANGIOGRAPHY Left 06/29/2017   Procedure: LEFT HEART CATH AND CORONARY ANGIOGRAPHY;  Surgeon: Wellington Hampshire, MD;  Location: [place] [facility] LAB;  Service: Cardiovascular;  Laterality: Left;   LEFT HEART CATH AND CORONARY ANGIOGRAPHY N/A 09/20/2017   Procedure: LEFT HEART CATH AND CORONARY ANGIOGRAPHY;  Surgeon: Wellington Hampshire, MD;  Location: [place] CV LAB;  Service: Cardiovascular;  Laterality: N/A;   LEFT HEART CATH AND CORONARY ANGIOGRAPHY N/A 12/20/2017   Procedure: LEFT HEART CATH AND CORONARY ANGIOGRAPHY;  Surgeon: Wellington Hampshire, MD;  Location: [place] CV LAB;  Service: Cardiovascular;  Laterality: N/A;   LEFT HEART CATH AND CORONARY ANGIOGRAPHY N/A 04/20/2019   Procedure: LEFT HEART CATH AND CORONARY ANGIOGRAPHY possible PCI;  Surgeon: Minna Merritts, MD;  Location: [place] [facility] LAB;  Service: Cardiovascular;  Laterality: N/A;   LEFT HEART CATH AND CORONARY ANGIOGRAPHY N/A 12/13/2019   Procedure: LEFT HEART CATH AND CORONARY ANGIOGRAPHY;  Surgeon: Wellington Hampshire, MD;  Location: [place] [facility];  Service: Cardiovascular;  Laterality: N/A;   MELANOMA EXCISION  2016   Dr. Henreitta Cea ABLATION  2002   RIGHT OOPHORECTOMY     SHOULDER ARTHROSCOPY WITH OPEN ROTATOR CUFF REPAIR Right 01/07/2016   Procedure: SHOULDER ARTHROSCOPY WITH DEBRIDMENT, SUBACHROMIAL DECOMPRESSION;  Surgeon: Corky Mull, MD;  Location[facility];  Service: Orthopedics;  Laterality: Right;   SHOULDER ARTHROSCOPY WITH OPEN ROTATOR CUFF REPAIR Right 03/16/2017   Procedure: SHOULDER ARTHROSCOPY WITH OPEN ROTATOR CUFF REPAIR POSSIBLE  BICEPS TENODESIS;  Surgeon: Corky Mull, MD;  Location: [facility];  Service: Orthopedics;  Laterality: Right;   TRIGGER FINGER RELEASE Right     [place]   Family History  Problem Relation Age of Onset   Hypertension Mother    Anxiety disorder Mother    Depression Mother    Bipolar disorder Mother    Heart disease Mother        No details   Hyperlipidemia Mother    Kidney disease Father    Heart disease Father 55   Hypertension Father    Diabetes Father    Stroke Father    Colon cancer Father        dx in his 3's   Anxiety disorder Father    Depression Father    Skin cancer Father    Kidney disease Sister    Thyroid nodules Sister  Hypertension Sister    Hypertension Sister    Diabetes Sister    Hyperlipidemia Sister    Depression Sister    Breast cancer Maternal Aunt 49   Breast cancer Maternal Aunt 68   Ovarian cancer Cousin    Colon cancer Cousin    Kidney cancer Cousin    Breast cancer Other    Bladder Cancer Neg Hx    Social History   Socioeconomic History   Marital status: Divorced    Spouse name: Not on file   Number of children: 1   Years of education: Not on file   Highest education level: High school graduate  Occupational History   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 Disabled through 08/02/2013 per forms 02/03/2013  Tobacco Use   Smoking status: Former    Packs/day: 0.25    Years: 1.00    Pack years: 0.25    Types: Cigarettes    Quit date: 08/31/1994    Years since quitting: 26.5   Smokeless tobacco: Never   Tobacco comments:    quit 28 years ago  Vaping Use   Vaping Use: Never used  Substance and Sexual Activity   Alcohol use: No    Alcohol/week: 0.0 standard drinks   Drug use: No   Sexual activity: Not Currently    Birth control/protection: None, Surgical    Comment: Ablation  Other Topics Concern   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   Lives at home with son   Social Determinants of Health   Financial Resource Strain: Low Risk    Difficulty of Paying Living Expenses: Not hard at all  Food Insecurity: No Food Insecurity   Worried About Charity fundraiser in the Last Year: Never true   Arboriculturist in the Last Year: Never true  Transportation Needs: No Transportation Needs   Lack of Transportation (Medical): No   Lack of Transportation (Non-Medical): No  Physical Activity: Not on file  Stress: Stress Concern Present   Feeling of Stress : Rather much  Social Connections: Moderately Integrated   Frequency of Communication with Friends and Family: More than three times a week   Frequency of Social Gatherings with Friends and Family: Twice a week   Attends Religious Services: More than 4 times per year   Active Member of Clubs or Organizations: Yes   Attends Music therapist: More than 4 times per year   Marital Status: Divorced   Allergies  Allergen Reactions   Lipitor [Atorvastatin] Other (See Comments)    Leg pains    Medications  (Not in a hospital admission)    Vitals   Vitals:   03/08/21 1730 03/08/21 1745 03/08/21 1800 03/08/21 1815  BP: (!) 108/53 (!) 110/57 (!) 114/40 (!) 113/44  Pulse: (!) 48 (!) 58 (!) 51 (!) 54  Resp: (!) 9 11 (!) 9 (!) 8  Temp:      TempSrc:      SpO2: 99% 99% 99% 99%     There is no height or weight on file to calculate BMI.  Physical Exam   General: Laying comfortably in bed; in no acute distress.  HENT: Normal oropharynx and mucosa. Normal external appearance of ears and nose.  Neck: Supple, no pain or tenderness  CV:  No JVD. No peripheral edema.  Pulmonary: Symmetric Chest rise. Normal respiratory effort.  Abdomen: Soft to touch, non-tender.  Ext: No cyanosis, edema, or deformity  Skin: No rash. Normal palpation of skin.    Musculoskeletal: Normal digits and nails by inspection. No clubbing.   Neurologic Examination  Mental status/Cognition: Does not open eyes to voice, opens eyes after a couple mins of tactile and noxious stimuli and makes brief eye contact. Soes right back to sleep. Does not follow commands. Says "stop" when doing noxious stimuli. Speech/language: the only word she said was stop during my entire evaluation. She said it quite a few times. Speech was not dysarthric. Does not follow commands. Does not repeat. Cranial nerves:   CN II Pupils equal and reactive to light, uanble to assess for VF defect.   CN III,IV,VI EOM intact, no gaze preference or deviation, no nystagmus   CN V Corneals intact BL, closes her eyes tight shut.   CN VII no asymmetry, no nasolabial fold flattening   CN VIII Does not turn head to speech   CN IX & X Gag intact.   CN XI Unable to assess.   CN XII midline tongue but does not protrude on command.   Motor/sensory:  Muscle bulk: Normal, tone normal.  Unable to do detailed strength testing secondary to encephalopathy and somnolence.  She localizes in all extremities to noxious stimuli.  Reflexes:  Right Left Comments  Pectoralis      Biceps (C5/6) 2 2   Brachioradialis (C5/6) 2 2    Triceps (C6/7) 2 2    Patellar (L3/4) 2 2    Achilles (S1)      [place]      Plantar mute mute   Jaw jerk     Coordination/Complex Motor:  Unable to assess but when she attempts to localize, movements do not appear ataxic.  Labs   CBC:  Recent Labs  Lab 03/08/21 1425  WBC 7.3  HGB 12.0  HCT 38.6  MCV 88.1  PLT 148*    Basic Metabolic Panel:  Lab Results  Component Value Date   NA 143 03/08/2021   K 4.6 03/08/2021   CO2 21 (L) 03/08/2021   GLUCOSE 97 03/08/2021   BUN 21 (H) 03/08/2021   CREATININE 1.58 (H) 03/08/2021   CALCIUM 8.7 (L) 03/08/2021   GFRNONAA 39 (L) 03/08/2021   GFRAA 29 (L) 03/13/2020   Lipid Panel:  Lab Results  Component Value Date    [place] 95 02/08/2021   HgbA1c:  Lab Results  Component Value Date   HGBA1C 5.6 02/08/2021   Urine Drug Screen:     Component Value Date/Time   LABOPIA NONE DETECTED 02/07/2021 2229   LABOPIA POSITIVE (A) 07/10/2009 2040   COCAINSCRNUR NONE DETECTED 02/07/2021 2229   LABBENZ POSITIVE (A) 02/07/2021 2229   LABBENZ NONE DETECTED 07/10/2009 2040   AMPHETMU NONE DETECTED 02/07/2021 2229   AMPHETMU NONE DETECTED 07/10/2009 2040   THCU NONE DETECTED 02/07/2021 2229   THCU NONE DETECTED 07/10/2009 2040   LABBARB NONE DETECTED 02/07/2021 2229   LABBARB  07/10/2009 2040    NONE DETECTED        DRUG SCREEN FOR MEDICAL PURPOSES ONLY.  IF CONFIRMATION IS NEEDED FOR ANY PURPOSE, NOTIFY LAB WITHIN 5 DAYS.        LOWEST DETECTABLE LIMITS FOR URINE DRUG SCREEN Drug Class       Cutoff (ng/mL) Amphetamine      1000 Barbiturate  200 Benzodiazepine   103 Tricyclics       159 Opiates          300 Cocaine          300 THC              50    Alcohol Level     Component Value Date/Time   ETH <10 03/01/2018 1850    CT Head without contrast(Personally reviewed): CTH was negative for a large hypodensity concerning for a large territory infarct or hyperdensity concerning for an ICH.  cEEG:  Generalized slowing with overlying left sided slowing and rare L temporal sharps  Impression   [name] is a 51 y.o. female with recent admission for subclinical status epilepticus discharged on Keppra 1581m BID and Vimpat 550mBID who presents with progressive confusion in the afternoon and nor responding well.  Exam with profound encephalopathy likely a combination of post ictal somnolence, ativan. EEG notable for generalized slowing with overlying left sided slowing and rare L temporal sharps but no seizures.  Will keep her on cEEG overnight and will also need to closely monitor her airways but I would expect it to improve over the next couple of hours.  On discussion with son,  it appears that she is either experiencing significant somnolence with Keppra or maybe having subclinical seizures. She has been somnolent since dischagre from hospital in Jan 2023.  Impression: Prolonged seizure with no provoking factors History of epilepsy. History of status epilepticus. Recommendations  - Continue Keppra 1500 mg twice daily. - Increase Vimpat to 100 mg twice daily. - Continues EEG overnight and further AEDs - Seizure precautions with seizure pads. - Will need to switch to a different AED in the future. - she seems to be protecting her airway for now and I think her somnolence is a combination of breakthrough seizures and ativan. Will need to closely monitor her airway but I expect this to improve.  ________________________________________________________  Plan discussed with Dr. ShKarle Starchith the ED team in person.  This patient is critically ill and at significant risk of neurological worsening, death and care requires constant monitoring of vital signs, hemodynamics,respiratory and cardiac monitoring, neurological assessment, discussion with family, other specialists and medical decision making of high complexity. I spent 45 minutes of neurocritical care time  in the care of  this patient. This was time spent independent of any time provided by nurse practitioner or PA.  SaMount Sterlingager Number [phone]/05/2021  9:08 PM   I re-evaluated her again about an hour after my initial evaluation. She continues to have obtunded mentation with no significant improvement. Can barely stay awake for more than a few secs. She might be better served in the ICU overnight for close monitoring of her airway. I do not think that she needs to be intubated right now thou. Discussed with Dr. ShKarle Starchgain. 03/08/2021 9:37 PM    Thank you for the opportunity to take part in the care of this patient. If you have any further questions, please contact the  neurology consultation attending.  Signed,  SaLake Providenceager Number [phone] _ _   _ __   _ __ _ _  __ __   _ __   __ _

## 2021-03-08 NOTE — Procedures (Signed)
Routine EEG Report  [name] is a 51 y.o. female with a history of status epilepticus who is undergoing an EEG to evaluate for seizures.  Report: This EEG was acquired with electrodes placed according to the International 10-20 electrode system (including Fp1, Fp2, F3, F4, C3, C4, P3, P4, O1, O2, T3, T4, T5, T6, A1, A2, Fz, Cz, Pz). The following electrodes were missing or displaced: none.  The best background was 6 Hz. This activity is reactive to stimulation. Drowsiness was manifested by background fragmentation; deeper stages of sleep were identified by K complexes and sleep spindles. There was focal slowing over the left frontotemporal region. There were rare sharp waves over the left temporal region. There were no electrographic seizures identified. Photic stimulation and hyperventilation were not performed.   Impression and clinical correlation: This EEG was obtained while awake and asleep and is abnormal due to: - mild diffuse slowing indicative of global cerebral dysfunction - focal slowing over the left frontotemporal region indicating superimposed focal cerebral dysfunction in that region - Rare sharp waves indicate increased epileptogenic potential in the L temporal region  Kendra Monks, MD [facility] [phone]  If 7pm- 7am, please page neurology on call as listed in [place].
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
## 2021-03-08 NOTE — ED Provider Notes (Addendum)
[facility] Provider Note    Event Date/Time   First MD Initiated Contact with Patient 03/08/21 1422     (approximate)   History   AMS  HPI  [name] is a 51 y.o. female with extensive past medical history including demyelinating disease, CVA with left-sided hemiplegia, CAD status post multiple PCI, CKD, diabetes, hypertension and new seizure disorder.  Patient presents with altered mental status, she is unable provide any history.  EMS reports patient recently diagnosed with seizure disorder during hospital stay, medication compliance is unclear.     Physical Exam   Triage Vital Signs: ED Triage Vitals  Enc Vitals Group     BP --      Pulse Rate 03/08/21 1419 60     Resp 03/08/21 1419 (!) 7     Temp 03/08/21 1419 98.5 F (36.9 C)     Temp src --      SpO2 03/08/21 1419 100 %     Weight 03/08/21 1420 80 kg (176 lb 5.9 oz)     Height 03/08/21 1420 1.702 m (5\' 7" )     Head Circumference --      Peak Flow --      Pain Score 03/08/21 1420 0     Pain Loc --      Pain Edu? --      Excl. in [place]? --     Most recent vital signs: Vitals:   03/08/21 1423 03/08/21 1430  BP: (!) 145/78 (!) 127/46  Pulse: (!) 58 (!) 55  Resp: 13 (!) 9  Temp: 98.5 F (36.9 C)   SpO2: 100% 100%     General: Unresponsive, withdraws from pain CV:  Good peripheral perfusion.  Resp:  Normal effort.  Breathing on her own Abd:  No distention.  No abdominal tenderness palpation Other:  No extremity injuries PERRLA,   ED Results / Procedures / Treatments   Labs (all labs ordered are listed, but only abnormal results are displayed) Labs Reviewed  CBC - Abnormal; Notable for the following components:      Result Value   Platelets 148 (*)    All other components within normal limits  COMPREHENSIVE METABOLIC PANEL - Abnormal; Notable for the following components:   Chloride 115 (*)    CO2 21 (*)    BUN 21 (*)    Creatinine, Ser 1.58 (*)    Calcium  8.7 (*)    Total Protein 5.8 (*)    Albumin 3.1 (*)    GFR, Estimated 39 (*)    All other components within normal limits  BLOOD GAS, VENOUS - Abnormal; Notable for the following components:   pO2, Ven 49.0 (*)    All other components within normal limits  RESP PANEL BY RT-PCR (FLU A&B, COVID) ARPGX2  URINALYSIS, COMPLETE (UACMP) WITH MICROSCOPIC  TROPONIN I (HIGH SENSITIVITY)     EKG  ED ECG REPORT I, Lavonia Drafts, the attending physician, personally viewed and interpreted this ECG.  Date: 03/08/2021  Rhythm: Sinus bradycardia QRS Axis: normal Intervals: normal ST/T Wave abnormalities: normal Narrative Interpretation: no evidence of acute ischemia    RADIOLOGY Chest x-ray reviewed by me, no acute abnormality    PROCEDURES:  Critical Care performed: yes  CRITICAL CARE Performed by: Lavonia Drafts   Total critical care time:30 minutes  Critical care time was exclusive of separately billable procedures and treating other patients.  Critical care was necessary to treat or prevent imminent or life-threatening  deterioration.  Critical care was time spent personally by me on the following activities: development of treatment plan with patient and/or surrogate as well as nursing, discussions with consultants, evaluation of patient's response to treatment, examination of patient, obtaining history from patient or surrogate, ordering and performing treatments and interventions, ordering and review of laboratory studies, ordering and review of radiographic studies, pulse oximetry and re-evaluation of patient's condition.   Procedures   MEDICATIONS ORDERED IN ED: Medications  levETIRAcetam (KEPPRA) IVPB 1000 mg/100 mL premix (0 mg Intravenous Stopped 03/08/21 1519)     IMPRESSION / MDM / ASSESSMENT AND PLAN / ED COURSE  I reviewed the triage vital signs and the nursing notes.   Patient with extensive past medical history as detailed above presents with altered  mental status.  Here primarily with decreased responsiveness.  Vital signs are stable.  On review of records this appears very similar to her presentation in January of this year.  During hospitalization she had EEG which demonstrated status epilepticus, required transfer to [name] for continuous EEG, eventually improved with antiepileptic drugs and was discharged.  Patient's lab work today is reassuring, she did receive IV Versed from EMS.  I have ordered IV Keppra as well  I discussed with Dr. Quinn Axe of neurology who recommends the patient be transferred ED to ED to [facility] for EEG to determine floor/ICU placement   Patient accepted ED to ED transfer by Dr. Gilford Raid        FINAL CLINICAL IMPRESSION(S) / ED DIAGNOSES   Final diagnoses:  Altered mental status, unspecified altered mental status type     Rx / DC Orders   ED Discharge Orders     None        Note:  This document was prepared using Dragon voice recognition software and may include unintentional dictation errors.   Lavonia Drafts, MD 03/08/21 1459    Lavonia Drafts, MD 03/08/21 [phone]

## 2021-03-08 NOTE — ED Triage Notes (Signed)
Seizure 45-1hour, not verbal, pt responds to pain , 2 im versed administered right delt

## 2021-03-08 NOTE — Progress Notes (Signed)
vLTM started  all impedances below 10kohms  MRI leads  [facility] not monitoring  pt in ER.Marland Kitchen

## 2021-03-08 NOTE — ED Provider Notes (Signed)
[facility] EMERGENCY DEPARTMENT  Provider Note  CSN: [id number] Arrival date & time: 03/08/21 1642  History No chief complaint on file.   [name] is a 51 y.o. female brought to the ED via [place] as a transfer from [facility] ED for AMS. Per son at bedside patient was in her usual state of health this morning and then this afternoon was very confused and not responding well. She had a similar admission in January which was eventually deemed to be due to subclinical status epilepticus on EEG. She was started on Keppra and eventually discharged home. Son states she has been taking her meds as prescribed. She was sent to [facility] to get an EEG as recommended by Neurology. Patient is unable to provide any other history.    Home Medications Prior to Admission medications   Medication Sig Start Date End Date Taking? Authorizing Provider  AIMOVIG 140 MG/ML SOAJ Inject 140 mg into the skin every 28 (twenty-eight) days. 10/14/20   [provider]  ALPRAZolam Duanne Moron) 1 MG tablet Take 1 tablet by mouth twice daily Patient taking differently: Take 1 mg by mouth at bedtime. 11/26/20   [name], MD  aspirin EC 81 MG EC tablet Take 1 tablet (81 mg total) by mouth daily. Swallow whole. 12/10/20 02/09/22  Elodia Florence., MD  Baclofen 5 MG TABS Take 1 tablet by mouth 2 (two) times daily as needed (muscle spasms). 11/26/20   [provider]  carvedilol (COREG) 12.5 MG tablet Take 1 tablet by mouth twice daily 08/21/20   Minna Merritts, MD  clopidogrel (PLAVIX) 75 MG tablet Take 1 tablet (75 mg total) by mouth daily. 11/20/20   Furth, Cadence H, PA-C  desvenlafaxine (PRISTIQ) 50 MG 24 hr tablet Take 50 mg by mouth daily. 11/26/20   [provider]  diclofenac Sodium (VOLTAREN) 1 % GEL Apply 2 g topically 4 (four) times daily.    [provider]  diphenoxylate-atropine (LOMOTIL) 2.5-0.025 MG tablet Take 1 tablet by mouth 4 (four) times  daily as needed for diarrhea or loose stools. 01/07/21   [name], MD  estradiol (ESTRACE) 0.1 MG/GM vaginal cream Estrogen Cream Instruction Discard applicator Apply pea sized amount to tip of finger to urethra before bed. Wash hands well after application. Use Monday, Wednesday and Friday Patient taking differently: Place 1 Applicatorful vaginally 3 (three) times a week. Estrogen Cream Instruction Discard applicator Apply pea sized amount to tip of finger to urethra before bed. Wash hands well after application. Use Monday, Wednesday and Friday 05/08/20   Billey Co, MD  furosemide (LASIX) 20 MG tablet Take 1 tablet by mouth every day as needed for swelling Patient taking differently: Take 20 mg by mouth daily as needed (swelling). 12/13/20   Furth, Cadence H, PA-C  gabapentin (NEURONTIN) 300 MG capsule Take 2 capsules (600 mg total) by mouth 2 (two) times daily. 07/24/20   [name], MD  glucose 4 GM chewable tablet Chew 1 tablet (4 g total) by mouth 3 (three) times daily as needed for low blood sugar. Home med. 02/08/21   Enzo Bi, MD  hydrALAZINE (APRESOLINE) 50 MG tablet Take 1 tablet (50 mg total) by mouth 3 (three) times daily. 02/11/21   Patrecia Pour, MD  isosorbide mononitrate (IMDUR) 120 MG 24 hr tablet Take 1 tablet by mouth every day 01/28/21   Minna Merritts, MD  lacosamide (VIMPAT) 50 MG TABS tablet Take 1 tablet (  50 mg total) by mouth 2 (two) times daily. 02/11/21   Patrecia Pour, MD  levETIRAcetam (KEPPRA) 750 MG tablet Take 2 tablets (1,500 mg total) by mouth 2 (two) times daily. 02/11/21   Patrecia Pour, MD  levothyroxine (SYNTHROID) 25 MCG tablet Take 1 tablet by mouth 30 minutes before breakfast 11/26/20   [name], MD  mirabegron ER (MYRBETRIQ) 50 MG TB24 tablet Take 1 tablet (50 mg total) by mouth daily. 02/17/21   Bjorn Loser, MD  nitrofurantoin (MACRODANTIN) 100 MG capsule Hold while on IV antibiotic. Patient taking differently: Take 100 mg by mouth 2  (two) times daily. 02/08/21   Enzo Bi, MD  nitroGLYCERIN (NITROSTAT) 0.4 MG SL tablet Dissolve 1 tablet under tongue every 18mins, up to 3 doses, for chest pain. Max 3 doses in 50mins. Patient taking differently: Place 0.4 mg under the tongue every 5 (five) minutes x 3 doses as needed for chest pain. 12/30/20   Minna Merritts, MD  pantoprazole (PROTONIX) 40 MG tablet Take 1 tablet (40 mg total) by mouth 2 (two) times daily. 12/20/20 02/09/22  [name], MD  Polyethyl Glycol-Propyl Glycol (SYSTANE OP) Place 1 drop into both eyes daily as needed (dry eyes).    [provider]  promethazine (PHENERGAN) 25 MG tablet Take 1 tablet (25 mg total) by mouth every 6 (six) hours as needed for nausea or vomiting. 07/02/20   [name], MD  Rimegepant Sulfate (NURTEC) 75 MG TBDP Take 75 mg by mouth as needed. 01/17/21   Marcial Pacas, MD  rosuvastatin (CRESTOR) 40 MG tablet Take 1 tablet (40 mg total) by mouth daily at 6 PM. Patient not taking: Reported on 03/08/2021 10/25/19   Minna Merritts, MD  traZODone (DESYREL) 100 MG tablet Take 1 to 2 tablets by mouth at bedtime 02/24/21   [name], MD     Allergies    Lipitor [atorvastatin]   Review of Systems   Review of Systems Please see HPI for pertinent positives and negatives  Physical Exam BP (!) 150/70    Pulse (!) 59    Temp 98.4 F (36.9 C) (Oral)    Resp 20    SpO2 99%   Physical Exam Vitals and nursing note reviewed.  Constitutional:      Appearance: Normal appearance.  HENT:     Head: Normocephalic and atraumatic.     Nose: Nose normal.     Mouth/Throat:     Mouth: Mucous membranes are moist.  Eyes:     Extraocular Movements: Extraocular movements intact.     Conjunctiva/sclera: Conjunctivae normal.  Cardiovascular:     Rate and Rhythm: Normal rate.  Pulmonary:     Effort: Pulmonary effort is normal.     Breath sounds: Normal breath sounds.  Abdominal:     General: Abdomen is flat.     Palpations: Abdomen  is soft.     Tenderness: There is no abdominal tenderness.  Musculoskeletal:        General: No swelling. Normal range of motion.     Cervical back: Neck supple.  Skin:    General: Skin is warm and dry.  Neurological:     Comments: Awake and alert, but does not speak or follow commands  Psychiatric:     Comments: Difficult to assess    ED Results / Procedures / Treatments   EKG None  Procedures Procedures  Medications Ordered in the ED Medications  lacosamide (VIMPAT) 100 mg in  sodium chloride 0.9 % 25 mL IVPB (has no administration in time range)  cefTRIAXone (ROCEPHIN) 1 g in sodium chloride 0.9 % 100 mL IVPB (has no administration in time range)  LORazepam (ATIVAN) injection 2 mg (2 mg Intravenous Given 03/08/21 1839)  lacosamide (VIMPAT) 200 mg in sodium chloride 0.9 % 25 mL IVPB (0 mg Intravenous Stopped 03/08/21 1909)  levETIRAcetam (KEPPRA) IVPB 1000 mg/100 mL premix (0 mg Intravenous Stopped 03/08/21 2023)    Initial Impression and Plan  Patient here at request of Neurology for EEG given similar presentation with status epilepticus in the past. RN reported respiratory rate of 6 on monitor which does not correlate with her exam or her SpO2. Will discuss with Neurology here.   ED Course   Clinical Course as of 03/08/21 2211  Sat Mar 08, 2021  1719 Spoke with Dr. Theda Sers, Neurology, who will come evaluate the patient. Will go ahead and order EEG. Patient is now on ETCO2 and she is having occasional pauses in her RR but is maintaining SpO2 well.  [CS]  1800 Dr. Theda Sers at bedside, requests Ativan 2mg  and additional Keppra 2g IV while awaiting EEG.  [CS]  2048 Per Dr. Lorrin Goodell, Neurology, patient is not in status and can be admitted to Hospitalist service.  [CS]  2107 Spoke with Dr. Roel Cluck, Hospitalist, who will evaluate for admission. [CS]  2130 Dr. Lorrin Goodell has requested ICU admission due to her continued AMS and brief episodes of apnea. Will discuss with ICU.  [CS]  2208  Spoke with ICU team who will admit. UA is concerning for UTI with many bacteria and >50WBC with Nitrites and LE. Will begin Rocephin.  [CS]    Clinical Course User Index [CS] Truddie Hidden, MD     MDM Rules/Calculators/A&P Medical Decision Making Problems Addressed: Altered mental status, unspecified altered mental status type: acute illness or injury that poses a threat to life or bodily functions Urinary tract infection without hematuria, site unspecified: acute illness or injury that poses a threat to life or bodily functions  Amount and/or Complexity of Data Reviewed Labs: ordered. Decision-making details documented in ED Course.  Risk Prescription drug management. Decision regarding hospitalization.    Final Clinical Impression(s) / ED Diagnoses Final diagnoses:  Altered mental status, unspecified altered mental status type  Urinary tract infection without hematuria, site unspecified    Rx / DC Orders ED Discharge Orders     None        Truddie Hidden, MD 03/08/21 2211

## 2021-03-09 ENCOUNTER — Inpatient Hospital Stay (HOSPITAL_COMMUNITY): Payer: Medicare Other

## 2021-03-09 DIAGNOSIS — R4182 Altered mental status, unspecified: Secondary | ICD-10-CM | POA: Diagnosis not present

## 2021-03-09 DIAGNOSIS — N319 Neuromuscular dysfunction of bladder, unspecified: Secondary | ICD-10-CM

## 2021-03-09 DIAGNOSIS — G9340 Encephalopathy, unspecified: Secondary | ICD-10-CM

## 2021-03-09 DIAGNOSIS — G40901 Epilepsy, unspecified, not intractable, with status epilepticus: Secondary | ICD-10-CM | POA: Diagnosis not present

## 2021-03-09 DIAGNOSIS — Z789 Other specified health status: Secondary | ICD-10-CM

## 2021-03-09 DIAGNOSIS — R569 Unspecified convulsions: Secondary | ICD-10-CM | POA: Diagnosis not present

## 2021-03-09 DIAGNOSIS — N390 Urinary tract infection, site not specified: Secondary | ICD-10-CM | POA: Diagnosis not present

## 2021-03-09 LAB — RAPID URINE DRUG SCREEN, HOSP PERFORMED
Amphetamines: NOT DETECTED
Barbiturates: NOT DETECTED
Benzodiazepines: POSITIVE — AB
Cocaine: NOT DETECTED
Opiates: NOT DETECTED
Tetrahydrocannabinol: NOT DETECTED

## 2021-03-09 LAB — GLUCOSE, CAPILLARY
Glucose-Capillary: 133 mg/dL — ABNORMAL HIGH (ref 70–99)
Glucose-Capillary: 140 mg/dL — ABNORMAL HIGH (ref 70–99)
Glucose-Capillary: 139 mg/dL — ABNORMAL HIGH (ref 70–99)
Glucose-Capillary: 120 mg/dL — ABNORMAL HIGH (ref 70–99)
Glucose-Capillary: 123 mg/dL — ABNORMAL HIGH (ref 70–99)
Glucose-Capillary: 133 mg/dL — ABNORMAL HIGH (ref 70–99)
Glucose-Capillary: 126 mg/dL — ABNORMAL HIGH (ref 70–99)

## 2021-03-09 LAB — BASIC METABOLIC PANEL
Anion gap: 12 (ref 5–15)
BUN: 16 mg/dL (ref 6–20)
CO2: 23 mmol/L (ref 22–32)
Calcium: 9.2 mg/dL (ref 8.9–10.3)
Chloride: 108 mmol/L (ref 98–111)
Creatinine, Ser: 1.38 mg/dL — ABNORMAL HIGH (ref 0.44–1.00)
GFR, Estimated: 46 mL/min — ABNORMAL LOW (ref >60)
Glucose, Bld: 154 mg/dL — ABNORMAL HIGH (ref 70–99)
Potassium: 4.6 mmol/L (ref 3.5–5.1)
Sodium: 143 mmol/L (ref 135–145)

## 2021-03-09 LAB — CBC
HCT: 44 % (ref 36.0–46.0)
Hemoglobin: 13.7 g/dL (ref 12.0–15.0)
MCH: 26.6 pg (ref 26.0–34.0)
MCHC: 31.1 g/dL (ref 30.0–36.0)
MCV: 85.4 fL (ref 80.0–100.0)
Platelets: 141 10*3/uL — ABNORMAL LOW (ref 150–400)
RBC: 5.15 MIL/uL — ABNORMAL HIGH (ref 3.87–5.11)
RDW: 14.9 % (ref 11.5–15.5)
WBC: 8 10*3/uL (ref 4.0–10.5)
nRBC: 0 % (ref 0.0–0.2)

## 2021-03-09 LAB — HEMOGLOBIN A1C
Hgb A1c MFr Bld: 5.6 % (ref 4.8–5.6)
Mean Plasma Glucose: 114.02 mg/dL

## 2021-03-09 LAB — TSH: TSH: 3.55 u[IU]/mL (ref 0.350–4.500)

## 2021-03-09 LAB — MRSA NEXT GEN BY PCR, NASAL: MRSA by PCR Next Gen: NOT DETECTED

## 2021-03-09 LAB — CREATININE, SERUM
Creatinine, Ser: 1.45 mg/dL — ABNORMAL HIGH (ref 0.44–1.00)
GFR, Estimated: 43 mL/min — ABNORMAL LOW (ref >60)

## 2021-03-09 LAB — MAGNESIUM: Magnesium: 1.6 mg/dL — ABNORMAL LOW (ref 1.7–2.4)

## 2021-03-09 LAB — PHOSPHORUS: Phosphorus: 4.1 mg/dL (ref 2.5–4.6)

## 2021-03-09 MED ORDER — LACOSAMIDE 200 MG/20ML IV SOLN
50.0000 mg | INTRAVENOUS | Status: AC
  Administered 2021-03-09: 50 mg via INTRAVENOUS
  Filled 2021-03-09: qty 5

## 2021-03-09 MED ORDER — SODIUM CHLORIDE 0.9 % IV SOLN
150.0000 mg | Freq: Two times a day (BID) | INTRAVENOUS | Status: DC
  Administered 2021-03-09 – 2021-03-10 (×2): 150 mg via INTRAVENOUS
  Filled 2021-03-09 (×3): qty 15

## 2021-03-09 MED ORDER — HYDRALAZINE HCL 50 MG PO TABS: 50.0000 mg | ORAL_TABLET | Freq: Three times a day (TID) | ORAL | Status: DC

## 2021-03-09 MED ORDER — LEVETIRACETAM IN NACL 1500 MG/100ML IV SOLN
1500.0000 mg | Freq: Two times a day (BID) | INTRAVENOUS | Status: DC
  Administered 2021-03-09 – 2021-03-12 (×7): 1500 mg via INTRAVENOUS
  Filled 2021-03-09 (×7): qty 100

## 2021-03-09 MED ORDER — ASPIRIN 81 MG PO TBEC
81.0000 mg | DELAYED_RELEASE_TABLET | Freq: Every day | ORAL | Status: DC
  Filled 2021-03-09: qty 1

## 2021-03-09 MED ORDER — CLOPIDOGREL BISULFATE 75 MG PO TABS: 75.0000 mg | ORAL_TABLET | Freq: Every day | ORAL | Status: DC

## 2021-03-09 MED ORDER — HYDRALAZINE HCL 50 MG PO TABS
50.0000 mg | ORAL_TABLET | Freq: Three times a day (TID) | ORAL | Status: DC
  Administered 2021-03-09 – 2021-03-13 (×12): 50 mg
  Filled 2021-03-09 (×12): qty 1

## 2021-03-09 MED ORDER — CARVEDILOL 12.5 MG PO TABS
12.5000 mg | ORAL_TABLET | Freq: Two times a day (BID) | ORAL | Status: DC
  Administered 2021-03-09 – 2021-03-10 (×3): 12.5 mg
  Filled 2021-03-09 (×4): qty 1

## 2021-03-09 MED ORDER — DOCUSATE SODIUM 50 MG/5ML PO LIQD: 100.0000 mg | Freq: Two times a day (BID) | ORAL | Status: DC | PRN

## 2021-03-09 MED ORDER — LORAZEPAM 2 MG/ML IJ SOLN
4.0000 mg | Freq: Once | INTRAMUSCULAR | Status: AC
  Administered 2021-03-09: 4 mg via INTRAVENOUS
  Filled 2021-03-09: qty 2

## 2021-03-09 MED ORDER — POLYETHYLENE GLYCOL 3350 17 G PO PACK: 17.0000 g | PACK | Freq: Every day | ORAL | Status: DC | PRN

## 2021-03-09 MED ORDER — CHLORHEXIDINE GLUCONATE CLOTH 2 % EX PADS
6.0000 | MEDICATED_PAD | Freq: Every day | CUTANEOUS | Status: DC
  Administered 2021-03-09 – 2021-03-20 (×12): 6 via TOPICAL

## 2021-03-09 MED ORDER — LEVOTHYROXINE SODIUM 25 MCG PO TABS: 25.0000 ug | ORAL_TABLET | Freq: Every day | ORAL | Status: DC

## 2021-03-09 MED ORDER — LEVOTHYROXINE SODIUM 25 MCG PO TABS
25.0000 ug | ORAL_TABLET | Freq: Every day | ORAL | Status: DC
  Administered 2021-03-11 – 2021-03-17 (×7): 25 ug
  Filled 2021-03-09 (×7): qty 1

## 2021-03-09 MED ORDER — ROSUVASTATIN CALCIUM 20 MG PO TABS
40.0000 mg | ORAL_TABLET | Freq: Every day | ORAL | Status: DC
  Administered 2021-03-09 – 2021-03-16 (×8): 40 mg
  Filled 2021-03-09 (×7): qty 2

## 2021-03-09 MED ORDER — VALPROATE SODIUM 100 MG/ML IV SOLN
3500.0000 mg | Freq: Once | INTRAVENOUS | Status: AC
  Administered 2021-03-09: 3500 mg via INTRAVENOUS
  Filled 2021-03-09: qty 35

## 2021-03-09 MED ORDER — ONDANSETRON HCL 4 MG/2ML IJ SOLN
4.0000 mg | Freq: Four times a day (QID) | INTRAMUSCULAR | Status: AC | PRN
  Administered 2021-03-09 (×3): 4 mg via INTRAVENOUS
  Filled 2021-03-09 (×3): qty 2

## 2021-03-09 MED ORDER — ROSUVASTATIN CALCIUM 20 MG PO TABS: 40.0000 mg | ORAL_TABLET | Freq: Every day | ORAL | Status: DC

## 2021-03-09 MED ORDER — ASPIRIN 81 MG PO CHEW
81.0000 mg | CHEWABLE_TABLET | Freq: Every day | ORAL | Status: DC
  Administered 2021-03-09 – 2021-03-17 (×9): 81 mg
  Filled 2021-03-09 (×9): qty 1

## 2021-03-09 MED ORDER — CLOPIDOGREL BISULFATE 75 MG PO TABS
75.0000 mg | ORAL_TABLET | Freq: Every day | ORAL | Status: DC
  Administered 2021-03-10 – 2021-03-17 (×8): 75 mg
  Filled 2021-03-09 (×9): qty 1

## 2021-03-09 MED ORDER — CARVEDILOL 12.5 MG PO TABS: 12.5000 mg | ORAL_TABLET | Freq: Two times a day (BID) | ORAL | Status: DC

## 2021-03-09 NOTE — Procedures (Signed)
EEG Procedure CPT/Type of Study: 51884; 24hr EEG with video Referring Provider: [facility] Primary Neurological Diagnosis: status epilepticus  History: This is a 51 yr old patient, undergoing an EEG to evaluate for status epilepticus. Clinical State: disoriented  Technical Description:  The EEG was performed using standard setting per the guidelines of American Clinical Neurophysiology Society (ACNS).  A minimum of 21 electrodes were placed on scalp according to the International 10-20 or/and 10-10 Systems. Supplemental electrodes were placed as needed. Single EKG electrode was also used to detect cardiac arrhythmia. Patient's behavior was continuously recorded on video simultaneously with EEG. A minimum of 16 channels were used for data display. Each epoch of study was reviewed manually daily and as needed using standard referential and bipolar montages. Computerized quantitative EEG analysis (such as compressed spectral array analysis, trending, automated spike & seizure detection) were used as indicated.   Day 1: from 2015 03/08/21 to 0815 03/09/21  EEG Description: Overall Amplitude:Normal Predominant Frequency: The background activity showed theta , with about 4-6 Hz, that was frequent. Superimposed Frequencies: frequent delta and some beta activity bilaterally The background was symmetric  Background Abnormalities: Generalized slowing; delta-theta slowing as above Rhythmic or periodic pattern: Generalized periodic discharges; continuous 1-2Hz  spiky GPDs, triphasic appearance at times Epileptiform activity: Yes; continuous high voltage spiky 1-2Hz  GPDs with epileptiform morphology Electrographic seizures: Unclear; continuous 1-2Hz  high voltage spiky GPDs as above but without discrete seizures Events: no   Breach rhythm: no  Reactivity: Present  Stimulation procedures:  Hyperventilation: not done Photic stimulation: not done  Sleep Background: Stage II  EKG:no significant  arrhythmia  Impression: This was a markedly abnormal continuous video EEG due to diffuse slowing with continuous high voltage spiky GPDs, indicative of a widespread cerebral disturbance with epileptogenic potential. Though no discrete seizures were seen, a trial of increased AED treatment is advised to evaluate for electrographic and clinical improvement.

## 2021-03-09 NOTE — Progress Notes (Signed)
[facility] Progress Note Patient Name: Maheen Cwikla DOB: 1970-10-23 MRN: [id number]   Date of Service  03/09/2021  HPI/Events of Note  Patient transferred from outside hospital for suspected sub-clinical status epilepticus associated with altered mental status, she was transferred so that she can receive continuous EEG monitoring. She does have a seizure history.  eICU Interventions  New Patient Evaluation.        Kerry Kass Atonya Templer 03/09/2021, 12:42 AM

## 2021-03-09 NOTE — Progress Notes (Signed)
[facility] Progress Note Patient Name: [name] DOB: [birth year] MRN: [id number]   Date of Service  03/09/2021  HPI/Events of Note  Patient complaining of nausea, QTC is 404.  eICU Interventions  PRN Zofran ordered.        Kerry Kass Winford Hehn 03/09/2021, 3:21 AM

## 2021-03-09 NOTE — Progress Notes (Signed)
LTM maintain done at bedside. No skin breakdown noted. Results pending ° °

## 2021-03-09 NOTE — Progress Notes (Signed)
Neurology Progress Note [name] MR# [id number] 03/09/2021   S: no overnight events; no new complaints.  O: Current vital signs: BP 140/73    Pulse 91    Temp 99.5 F (37.5 C) (Axillary)    Resp 12    SpO2 98%  Vital signs in last 24 hours: Temp:  [98 F (36.7 C)-99.5 F (37.5 C)] 99.5 F (37.5 C) (02/05 1200) Pulse Rate:  [48-148] 91 (02/05 1200) Resp:  [4-21] 12 (02/05 1200) BP: (86-179)/(40-104) 140/73 (02/05 1200) SpO2:  [97 %-100 %] 98 % (02/05 1200) Weight:  [80 kg] 80 kg (02/04 1420)  Largely unchanged compared to yesterday General: sleeping comfortably in bed; NAD.  CV: No JVD. No peripheral edema.  Pulmonary: Symmetric Chest rise. Normal respiratory effort.  Mental status/Cognition: Does not open eyes to voice however she opens eyes after a couple mins of tactile and noxious stimuli and makes brief eye contact. Goes back to sleep.  Does not follow commands.  She localizes in all extremities to noxious stimuli and is able to say "stop" to noxious stimuli. Speech/language: not verbal.  Does not follow commands.  Pupils equal and reactive to light, uanble to assess for VF defect. EOM intact, no gaze preference or deviation, no nystagmus  Closes her eyes tight shut. No asymmetry, no nasolabial fold flattening Does not turn head to speech Midline tongue but does not protrude on command. Muscle bulk: Normal, tone normal.  Unable to do detailed strength testing secondary to encephalopathy and somnolence. She .  Labs UA nitrite (+), LA (+), bacteria (+)    Imaging: There are no new imaging studies.   LTM 2015 on 03/08/21 to 0815 on 03/09/21: This was a markedly abnormal continuous video EEG due to diffuse slowing with continuous high voltage spiky 1-2Hz  GPDs GPDs, indicative of a widespread cerebral disturbance with epileptogenic potential. Though no discrete seizures were seen, a trial of increased AED treatment is advised to evaluate for electrographic and  clinical improvement.   Assessment: [name] is a 51 y.o. female  recent admission for subclinical status epilepticus discharged on Keppra 1500mg  BID and Vimpat 50mg  BID with progressive confusion yesterday transferred from OSH. She has been somnolent since dischagre from hospital in Jan 2023 and is either experiencing significant somnolence with Keppra and lacosamide or maybe having subclinical seizures.   This morning ~0950 EEG started to evolve and given 4mg  lorazepam and loaded with VPA 3,500mg  IV.   Impression: Prolonged seizure possibly provoked. Urinalysis (+) - Started ceftriaxone. History of epilepsy. History of status epilepticus. Neurogenic bladder - Self catheterization. History of frequent UTIs on prophylactic antibiotics which are no longer effective.  Recommendations: - Continue Keppra 1500mg  twice daily. - Increased Vimpat to 150mg  twice daily. - Continue cEEG. - Seizure precautions with seizure pads. - As needed IV Ativan 2mg  for seizure lasting more than 5 minutes.  Please notify neurology if administered  Electronically signed by:  Lynnae Sandhoff, MD Page: [phone] 03/09/2021, 12:41 PM  If 7pm- 7am, please page neurology on call as listed in [place].
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
## 2021-03-10 ENCOUNTER — Ambulatory Visit: Payer: Medicare Other | Admitting: Neurology

## 2021-03-10 ENCOUNTER — Inpatient Hospital Stay (HOSPITAL_COMMUNITY): Payer: Medicare Other

## 2021-03-10 DIAGNOSIS — R4182 Altered mental status, unspecified: Secondary | ICD-10-CM | POA: Diagnosis not present

## 2021-03-10 DIAGNOSIS — E440 Moderate protein-calorie malnutrition: Secondary | ICD-10-CM | POA: Insufficient documentation

## 2021-03-10 LAB — VITAMIN D 25 HYDROXY (VIT D DEFICIENCY, FRACTURES): Vit D, 25-Hydroxy: 19.3 ng/mL — ABNORMAL LOW (ref 30–100)

## 2021-03-10 LAB — POCT I-STAT 7, (LYTES, BLD GAS, ICA,H+H)
Acid-Base Excess: 2 mmol/L (ref 0.0–2.0)
Bicarbonate: 24.3 mmol/L (ref 20.0–28.0)
Calcium, Ion: 1.16 mmol/L (ref 1.15–1.40)
HCT: 43 % (ref 36.0–46.0)
Hemoglobin: 14.6 g/dL (ref 12.0–15.0)
O2 Saturation: 99 %
Patient temperature: 98.9
Potassium: 4.5 mmol/L (ref 3.5–5.1)
Sodium: 136 mmol/L (ref 135–145)
TCO2: 25 mmol/L (ref 22–32)
pCO2 arterial: 30.6 mmHg — ABNORMAL LOW (ref 32.0–48.0)
pH, Arterial: 7.507 — ABNORMAL HIGH (ref 7.350–7.450)
pO2, Arterial: 109 mmHg — ABNORMAL HIGH (ref 83.0–108.0)

## 2021-03-10 LAB — CBC WITH DIFFERENTIAL/PLATELET
Abs Immature Granulocytes: 0.02 10*3/uL (ref 0.00–0.07)
Basophils Absolute: 0.1 10*3/uL (ref 0.0–0.1)
Basophils Relative: 1 %
Eosinophils Absolute: 0.1 10*3/uL (ref 0.0–0.5)
Eosinophils Relative: 0 %
HCT: 46.6 % — ABNORMAL HIGH (ref 36.0–46.0)
Hemoglobin: 15.4 g/dL — ABNORMAL HIGH (ref 12.0–15.0)
Immature Granulocytes: 0 %
Lymphocytes Relative: 14 %
Lymphs Abs: 1.6 10*3/uL (ref 0.7–4.0)
MCH: 27.2 pg (ref 26.0–34.0)
MCHC: 33 g/dL (ref 30.0–36.0)
MCV: 82.3 fL (ref 80.0–100.0)
Monocytes Absolute: 0.6 10*3/uL (ref 0.1–1.0)
Monocytes Relative: 5 %
Neutro Abs: 9.3 10*3/uL — ABNORMAL HIGH (ref 1.7–7.7)
Neutrophils Relative %: 80 %
Platelets: 208 10*3/uL (ref 150–400)
RBC: 5.66 MIL/uL — ABNORMAL HIGH (ref 3.87–5.11)
RDW: 14.6 % (ref 11.5–15.5)
WBC: 11.6 10*3/uL — ABNORMAL HIGH (ref 4.0–10.5)
nRBC: 0 % (ref 0.0–0.2)

## 2021-03-10 LAB — GLUCOSE, CAPILLARY
Glucose-Capillary: 113 mg/dL — ABNORMAL HIGH (ref 70–99)
Glucose-Capillary: 142 mg/dL — ABNORMAL HIGH (ref 70–99)
Glucose-Capillary: 146 mg/dL — ABNORMAL HIGH (ref 70–99)
Glucose-Capillary: 156 mg/dL — ABNORMAL HIGH (ref 70–99)
Glucose-Capillary: 161 mg/dL — ABNORMAL HIGH (ref 70–99)

## 2021-03-10 LAB — BASIC METABOLIC PANEL
Anion gap: 15 (ref 5–15)
BUN: 23 mg/dL — ABNORMAL HIGH (ref 6–20)
CO2: 23 mmol/L (ref 22–32)
Calcium: 9.1 mg/dL (ref 8.9–10.3)
Chloride: 99 mmol/L (ref 98–111)
Creatinine, Ser: 1.46 mg/dL — ABNORMAL HIGH (ref 0.44–1.00)
GFR, Estimated: 43 mL/min — ABNORMAL LOW (ref >60)
Glucose, Bld: 140 mg/dL — ABNORMAL HIGH (ref 70–99)
Potassium: 4.3 mmol/L (ref 3.5–5.1)
Sodium: 137 mmol/L (ref 135–145)

## 2021-03-10 LAB — MAGNESIUM
Magnesium: 1.5 mg/dL — ABNORMAL LOW (ref 1.7–2.4)
Magnesium: 2.7 mg/dL — ABNORMAL HIGH (ref 1.7–2.4)

## 2021-03-10 LAB — VITAMIN B12: Vitamin B-12: 356 pg/mL (ref 180–914)

## 2021-03-10 LAB — PHOSPHORUS: Phosphorus: 5.3 mg/dL — ABNORMAL HIGH (ref 2.5–4.6)

## 2021-03-10 MED ORDER — PIPERACILLIN-TAZOBACTAM 3.375 G IVPB
3.3750 g | Freq: Once | INTRAVENOUS | Status: AC
  Administered 2021-03-10: 3.375 g via INTRAVENOUS
  Filled 2021-03-10: qty 50

## 2021-03-10 MED ORDER — MAGNESIUM SULFATE 4 GM/100ML IV SOLN
4.0000 g | Freq: Once | INTRAVENOUS | Status: AC
  Administered 2021-03-10: 4 g via INTRAVENOUS
  Filled 2021-03-10: qty 100

## 2021-03-10 MED ORDER — SODIUM CHLORIDE 0.9 % IV SOLN
300.0000 mg | Freq: Two times a day (BID) | INTRAVENOUS | Status: DC
  Administered 2021-03-10 – 2021-03-12 (×4): 300 mg via INTRAVENOUS
  Filled 2021-03-10 (×7): qty 30

## 2021-03-10 MED ORDER — PIPERACILLIN-TAZOBACTAM 3.375 G IVPB
3.3750 g | Freq: Three times a day (TID) | INTRAVENOUS | Status: DC
  Administered 2021-03-10 – 2021-03-11 (×2): 3.375 g via INTRAVENOUS
  Filled 2021-03-10 (×2): qty 50

## 2021-03-10 MED ORDER — ADULT MULTIVITAMIN W/MINERALS CH
1.0000 | ORAL_TABLET | Freq: Two times a day (BID) | ORAL | Status: DC
  Administered 2021-03-10 – 2021-03-16 (×11): 1
  Filled 2021-03-10 (×14): qty 1

## 2021-03-10 MED ORDER — SODIUM CHLORIDE 0.9 % IV SOLN
150.0000 mg | Freq: Once | INTRAVENOUS | Status: AC
  Administered 2021-03-10: 150 mg via INTRAVENOUS
  Filled 2021-03-10 (×2): qty 15

## 2021-03-10 MED ORDER — OSMOLITE 1.2 CAL PO LIQD
1000.0000 mL | ORAL | Status: DC
  Administered 2021-03-10: 1000 mL

## 2021-03-10 NOTE — Procedures (Signed)
EEG Procedure CPT/Type of Study: 62863; 24hr EEG with video Referring Provider: [place] Primary Neurological Diagnosis: status epilepticus  History: This is a 51 yr old patient, undergoing an EEG to evaluate for status epilepticus. Clinical State: disoriented  Technical Description:  The EEG was performed using standard setting per the guidelines of American Clinical Neurophysiology Society (ACNS).  A minimum of 21 electrodes were placed on scalp according to the International 10-20 or/and 10-10 Systems. Supplemental electrodes were placed as needed. Single EKG electrode was also used to detect cardiac arrhythmia. Patient's behavior was continuously recorded on video simultaneously with EEG. A minimum of 16 channels were used for data display. Each epoch of study was reviewed manually daily and as needed using standard referential and bipolar montages. Computerized quantitative EEG analysis (such as compressed spectral array analysis, trending, automated spike & seizure detection) were used as indicated.   Day 2: from 0815 03/09/21 to 0730 03/10/21  EEG Description: Overall Amplitude:Normal Predominant Frequency: The background activity showed theta , with about 4-6 Hz, that was frequent. Superimposed Frequencies: frequent delta and some beta activity bilaterally The background was symmetric  Background Abnormalities: Generalized slowing; delta-theta slowing as above Rhythmic or periodic pattern: Generalized periodic discharges; continuous 1-2Hz  spiky GPDs, triphasic appearance at times Epileptiform activity: Yes; continuous high voltage spiky 1-2Hz  GPDs with epileptiform morphology Electrographic seizures: Yes; some subtle brief ictal evolution to GPDs with 3-4hz  in short runs Events: no   Breach rhythm: no  Reactivity: Present  Stimulation procedures:  Hyperventilation: not done Photic stimulation: not done  Sleep Background: Stage II  EKG:no significant  arrhythmia  Impression: This was a markedly abnormal continuous video EEG due to diffuse slowing with continuous high voltage spiky GPDs, indicative of a widespread cerebral disturbance with epileptogenic potential. There was a transient improvement in morphology and frequency in the afternoon as medications were increased, however overnight there were some brief runs of increased 4hz  runs of spikes concerning for subclinical seizures.

## 2021-03-10 NOTE — Progress Notes (Addendum)
NAME:  [name], MRN:  [id number], DOB:  [birth year], LOS: 2 ADMISSION DATE:  2/4/[name], CONSULTATION DATE: 2/4 REFERRING MD: Karle Starch, CHIEF COMPLAINT:  AMS   History of Present Illness:  Patient is encephalopathic. Therefore history has been obtained from chart review.   [name] [address] [name], is a 51 y.o. female, who presented to the [facility] ED as a transfer from [facility] ED with a chief complaint of AMS who needed continuous EEG.   They have a pertinent past medical history of subclinical status epilepticus, CAD, DM2, CKD, GIB, CVA with left sided weakness, hypothyroidism.   On the AM of 2/4 she was reportedly in her usual state of health, however she became increasingly confused and altered as the day progressed. Per family she had a similar presentation in [name] and was noted to be in status epilepticus.   She was given 1000mg  of IV keppra at [facility] and the loaded again with 1000mg  of keppra, 2mg  ativan, and 200mg  IV vimpat at the [facility] ED. ED course was notable for a positive UA which the patient was started on rocephin. Head CT negative for acute process. She was noted to be having apneic spells in the ED. She has been seen by neurology at [facility] and [facility]. She was started on continuous EEG.  PCCM was consulted for admission.   Pertinent  Medical History  subclinical status epilepticus, CAD, DM2, CKD, GIB, CVA with lef sided weakness.   Significant Hospital Events: Including procedures, antibiotic start and stop dates in addition to other pertinent events   2/4 presented with altered mental status, CTH neg, UA +, EEG> 2/6 EEG >> This was a markedly abnormal continuous video EEG due to diffuse slowing with continuous high voltage spiky GPDs, indicative of a widespread cerebral disturbance with epileptogenic potential. There was a transient improvement in morphology and frequency in the afternoon as medications were increased, however overnight there were some brief runs of increased  4hz  runs of spikes concerning for subclinical seizures.   Interim History / Subjective:  NAEON. Arouses to name.  Did not follow commands for this provider,  Still encephalopathic. Continues to have episodes of apnea ( Very short and self resolving) She has been evaluated for OSA but per her son both were negative.( 2013 and 2019) .  Pt was having episodes where she would open her eyes, they would deviate far left, and the frequency of brain activity would increase ( Increased rate of spikes) rate would return to her baseline once she closed her eyes.  Plan is to increase her Vimpat dosing per neuro, and continue EEG  T Max 99.5, Urine Cx pending Net Negative 1600 cc's , 1900 cc's Urine output Labs pending  Objective   Blood pressure 129/83, pulse 88, temperature 98.9 F (37.2 C), temperature source Axillary, resp. rate 10, SpO2 96 %.        Intake/Output Summary (Last 24 hours) at 2/6/[name] 0932 Last data filed at 2/6/[name] 0900 Gross per 24 hour  Intake 1396.23 ml  Output 1250 ml  Net 146.23 ml     Examination: General: In bed, NAD, appears comfortable, noted some ? Seizure like activity upon assessment  HEENT: MM pink/moist, anicteric, atraumatic Neuro: RASS 0 to -1, PERRL 7mm, followed a few simple commands, but overall very groggy CV: S1S2, SR.  no m/r/g appreciated PULM:  clear in the upper lobes, clear and diminished in the lower lobes, trachea midline, chest expansion symmetric GI: soft, bsx4 active,  non-tender  , ND, Extremities: warm/dry, no pretibial edema, capillary refill less than 3 seconds, no obvious deformities  Skin:  no rashes or lesions noted   No labs this am>> will order now  Resolved Hospital Problem list     Assessment & Plan:  Altered mental status, ?post ictal Prolonged seizure HX status epilepticus, epilepsy HX CVA CT head negative for acute intracranial abnormality. Covid/Flu neg. VBG 7.33/48/49/25. CXR: no evidence of pneumonia, effusion, ptx.  UA positive for nitrite, many bacteria. ?subclinical seizures. -Neurology consulted and following. Workup per neuro. -AED per neruo. On 1500 keppra BID, Vimpat 100mg  BID -Continue EEG,  widespread cerebral disturbance with epileptogenic potential.  increasing AED per note 2/6 AM -continue LR for hydration, will order Cor Track for nutrition -Seizure precautions.  Apnea Plan -ABG now -consider CPAP   Hypomag on 2/5 ( 1/6)  Plan Replete 4 grams now Mag in am  Goal > 2 while patient has widespread cerebral disturbance   UTI HX of E faecium UTI UA positive for nitrite, many bacteria -Continue CTX given nitrite positive -follow up cultures, adjust abx as needed.  DM2 -Blood Glucose goal 140-180. -SSI - CBG Q 4  HX CAD Troponin HS 13 -Resume ASA, plavix, coreg, rosuvastatin   HX CKDIIIb Creat pending -Ensure renal perfusion. Goal MAP 65 or greater. -Avoid neprotoxic drugs as possible. -Strict I&O's  Hypothyroisidm -resume synthroid    Best Practice (right click and "Reselect all SmartList Selections" daily)   Diet/type: NPO DVT prophylaxis: prophylactic heparin  GI prophylaxis: PPI Lines: N/A Foley:  N/A Code Status:  full code Last date of multidisciplinary goals of care discussion [pending]  Labs   CBC: Recent Labs  Lab 03/08/21 1425 03/09/21 0228  WBC 7.3 8.0  HGB 12.0 13.7  HCT 38.6 44.0  MCV 88.1 85.4  PLT 148* 141*    Basic Metabolic Panel: Recent Labs  Lab 03/08/21 1425 03/09/21 0228  NA 143 143  K 4.6 4.6  CL 115* 108  CO2 21* 23  GLUCOSE 97 154*  BUN 21* 16  CREATININE 1.58* 1.38*   1.45*  CALCIUM 8.7* 9.2  MG  --  1.6*  PHOS  --  4.1   GFR: Estimated Creatinine Clearance: 49.4 mL/min (A) (by C-G formula based on SCr of 1.45 mg/dL (H)). Recent Labs  Lab 03/08/21 1425 03/09/21 0228  WBC 7.3 8.0    Liver Function Tests: Recent Labs  Lab 03/08/21 1425  AST 25  ALT 24  ALKPHOS 105  BILITOT 0.5  PROT 5.8*  ALBUMIN 3.1*    No results for input(s): LIPASE, AMYLASE in the last 168 hours. No results for input(s): AMMONIA in the last 168 hours.  ABG    Component Value Date/Time   PHART 7.06 (LL) 03/19/2017 0608   PCO2ART 56 (H) 03/19/2017 0608   PO2ART 69 (L) 03/19/2017 0608   HCO3 25.3 02/04/[name] 1439   ACIDBASEDEF 1.1 02/04/[name] 1439   O2SAT 81.1 02/04/[name] 1439     Coagulation Profile: No results for input(s): INR, PROTIME in the last 168 hours.  Cardiac Enzymes: No results for input(s): CKTOTAL, CKMB, CKMBINDEX, TROPONINI in the last 168 hours.  HbA1C: Hemoglobin A1C  Date/Time Value Ref Range Status  12/30/2013 03:55 AM 6.8 (H) 4.2 - 6.3 % Final    Comment:    The American Diabetes Association recommends that a primary goal of therapy should be <7% and that physicians should reevaluate the treatment regimen in patients with HbA1c values consistently >8%.  01/29/2013 04:57 AM 8.5 (H) 4.2 - 6.3 % Final    Comment:    The American Diabetes Association recommends that a primary goal of therapy should be <7% and that physicians should reevaluate the treatment regimen in patients with HbA1c values consistently >8%.    Hgb A1c MFr Bld  Date/Time Value Ref Range Status  02/05/[name] 02:28 AM 5.6 4.8 - 5.6 % Final    Comment:    (NOTE) Pre diabetes:          5.7%-6.4%  Diabetes:              >6.4%  Glycemic control for   <7.0% adults with diabetes   01/07/[name] 07:02 AM 5.6 4.8 - 5.6 % Final    Comment:    (NOTE) Pre diabetes:          5.7%-6.4%  Diabetes:              >6.4%  Glycemic control for   <7.0% adults with diabetes     CBG: Recent Labs  Lab 03/09/21 1727 03/09/21 1940 03/10/21 0057 03/10/21 0512 03/10/21 0742  GLUCAP 133* 126* 156* 113* 146*    Allergies Allergies  Allergen Reactions   Lipitor [Atorvastatin] Other (See Comments)    Leg pains     Home Medications  Prior to Admission medications   Medication Sig Start Date End Date Taking? Authorizing  Provider  AIMOVIG 140 MG/ML SOAJ Inject 140 mg into the skin every 28 (twenty-eight) days. 10/14/20   [provider]  ALPRAZolam Duanne Moron) 1 MG tablet Take 1 tablet by mouth twice daily Patient taking differently: Take 1 mg by mouth at bedtime. 11/26/20   [name], MD  aspirin EC 81 MG EC tablet Take 1 tablet (81 mg total) by mouth daily. Swallow whole. 12/10/20 02/09/22  Elodia Florence., MD  Baclofen 5 MG TABS Take 1 tablet by mouth 2 (two) times daily as needed (muscle spasms). 11/26/20   [provider]  carvedilol (COREG) 12.5 MG tablet Take 1 tablet by mouth twice daily 08/21/20   Minna Merritts, MD  clopidogrel (PLAVIX) 75 MG tablet Take 1 tablet (75 mg total) by mouth daily. 11/20/20   Furth, Cadence H, PA-C  desvenlafaxine (PRISTIQ) 50 MG 24 hr tablet Take 50 mg by mouth daily. 11/26/20   [provider]  diclofenac Sodium (VOLTAREN) 1 % GEL Apply 2 g topically 4 (four) times daily.    [provider]  diphenoxylate-atropine (LOMOTIL) 2.5-0.025 MG tablet Take 1 tablet by mouth 4 (four) times daily as needed for diarrhea or loose stools. 01/07/21   [name], MD  estradiol (ESTRACE) 0.1 MG/GM vaginal cream Estrogen Cream Instruction Discard applicator Apply pea sized amount to tip of finger to urethra before bed. Wash hands well after application. Use Monday, Wednesday and Friday Patient taking differently: Place 1 Applicatorful vaginally 3 (three) times a week. Estrogen Cream Instruction Discard applicator Apply pea sized amount to tip of finger to urethra before bed. Wash hands well after application. Use Monday, Wednesday and Friday 05/08/20   Billey Co, MD  furosemide (LASIX) 20 MG tablet Take 1 tablet by mouth every day as needed for swelling Patient taking differently: Take 20 mg by mouth daily as needed (swelling). 12/13/20   Furth, Cadence H, PA-C  gabapentin (NEURONTIN) 300 MG capsule Take 2 capsules (600 mg total) by mouth 2 (two)  times daily. 07/24/20   [name], MD  glucose 4 GM chewable  tablet Chew 1 tablet (4 g total) by mouth 3 (three) times daily as needed for low blood sugar. Home med. 02/08/21   Enzo Bi, MD  hydrALAZINE (APRESOLINE) 50 MG tablet Take 1 tablet (50 mg total) by mouth 3 (three) times daily. 02/11/21   Patrecia Pour, MD  isosorbide mononitrate (IMDUR) 120 MG 24 hr tablet Take 1 tablet by mouth every day 01/28/21   Minna Merritts, MD  lacosamide (VIMPAT) 50 MG TABS tablet Take 1 tablet (50 mg total) by mouth 2 (two) times daily. 02/11/21   Patrecia Pour, MD  levETIRAcetam (KEPPRA) 750 MG tablet Take 2 tablets (1,500 mg total) by mouth 2 (two) times daily. 02/11/21   Patrecia Pour, MD  levothyroxine (SYNTHROID) 25 MCG tablet Take 1 tablet by mouth 30 minutes before breakfast 11/26/20   [name], MD  mirabegron ER (MYRBETRIQ) 50 MG TB24 tablet Take 1 tablet (50 mg total) by mouth daily. 02/17/21   Bjorn Loser, MD  nitrofurantoin (MACRODANTIN) 100 MG capsule Hold while on IV antibiotic. Patient taking differently: Take 100 mg by mouth 2 (two) times daily. 02/08/21   Enzo Bi, MD  nitroGLYCERIN (NITROSTAT) 0.4 MG SL tablet Dissolve 1 tablet under tongue every 24mins, up to 3 doses, for chest pain. Max 3 doses in 20mins. Patient taking differently: Place 0.4 mg under the tongue every 5 (five) minutes x 3 doses as needed for chest pain. 12/30/20   Minna Merritts, MD  pantoprazole (PROTONIX) 40 MG tablet Take 1 tablet (40 mg total) by mouth 2 (two) times daily. 12/20/20 02/09/22  [name], MD  Polyethyl Glycol-Propyl Glycol (SYSTANE OP) Place 1 drop into both eyes daily as needed (dry eyes).    [provider]  promethazine (PHENERGAN) 25 MG tablet Take 1 tablet (25 mg total) by mouth every 6 (six) hours as needed for nausea or vomiting. 07/02/20   [name], MD  Rimegepant Sulfate (NURTEC) 75 MG TBDP Take 75 mg by mouth as needed. 01/17/21   Marcial Pacas, MD  rosuvastatin  (CRESTOR) 40 MG tablet Take 1 tablet (40 mg total) by mouth daily at 6 PM. Patient not taking: Reported on 2/4/[name] 10/25/19   Minna Merritts, MD  traZODone (DESYREL) 100 MG tablet Take 1 to 2 tablets by mouth at bedtime 02/24/21   [name], MD     Critical care time: n/a    Magdalen Spatz, MSN, AGACNP-BC [place] on call pager [phone]  [facility]  2/6/[name] , 9:32 AM  Please see [email or web address] for pager details If no response, please call [phone] After hours, please call Elink at [phone]

## 2021-03-10 NOTE — Progress Notes (Signed)
°  Transition of Care [facility]) Screening Note   Patient Details  Name: [name] Date of Birth: Aug 13, 1970   Transition of Care [facility]) CM/SW Contact:    Benard Halsted, LCSW Phone Number: 03/10/2021, 8:35 AM    Transition of Care Department [facility]) has reviewed patient and no TOC needs have been identified at this time. We will continue to monitor patient advancement through interdisciplinary progression rounds. If new patient transition needs arise, please place a TOC consult.

## 2021-03-10 NOTE — Procedures (Signed)
Cortrak  Person Inserting Tube:  Maylon Peppers C, RD Tube Type:  Cortrak - 43 inches Tube Size:  10 Tube Location:  Left nare Initial Placement:  Stomach Secured by: Bridle Technique Used to Measure Tube Placement:  Marking at nare/corner of mouth Cortrak Secured At:  63 cm  Cortrak Tube Team Note:  Consult received to place a Cortrak feeding tube.   X-ray is required, abdominal x-ray has been ordered by the Cortrak team. Please confirm tube placement before using the Cortrak tube.   If the tube becomes dislodged please keep the tube and contact the Cortrak team at [email or web address] ([email or web address]) for replacement.  If after hours and replacement cannot be delayed, place a NG tube and confirm placement with an abdominal x-ray.    Lockie Pares., RD, LDN, CNSC See AMiON for contact information

## 2021-03-10 NOTE — Progress Notes (Addendum)
Neurology Progress Note  Brief HPI: [name], is a 51 y.o. female, who presented to the [facility] ED as a transfer from [facility] ED with a chief complaint of AMS. She has a pertinent past medical history of subclinical status epilepticus, CAD, DM2, CKD, GIB, CVA with left sided weakness, hypothyroidism.  On the AM of 2/4 she was reportedly in her usual state of health, however she became increasingly confused and altered as the day progressed. She was given 1000mg  of IV keppra at [facility] and the loaded again with 1000mg  of keppra, 2mg  ativan, and 200mg  IV vimpat at the [facility] ED. ED course was notable for a positive UA which the patient was started on rocephin. Currently on cEEG  Subjective: Patient is more alert today per family and RN staff, she is still not following commands.  Son is at bedside. At baseline she is walking/talking can do ADLs.  Exam: Vitals:   03/10/21 0700 03/10/21 0800  BP: (!) 160/104   Pulse: (!) 105   Resp: 12   Temp:  98.9 F (37.2 C)  SpO2: 97%    Gen: In bed, NAD Resp: non-labored breathing, no acute distress Abd: soft, nt  Neuro: Mental Status: Eyes open, does not follow commands . Will stare off to the left, does not track examiner.  Cranial Nerves:Pupils midline, equal 103mm and reactive, tracks to midline, preferential gaze to the left, head midline, no facial asymmetry noted Motor: hand grasp strong  to command. Sensory:sensation to light touch intact DTR: 2+ in all extremities Gait: Deferred  Pertinent Labs: CBC    Component Value Date/Time   WBC 11.6 (H) 03/10/2021 0946   RBC 5.66 (H) 03/10/2021 0946   HGB 15.4 (H) 03/10/2021 0946   HGB 11.9 12/20/2020 1103   HCT 46.6 (H) 03/10/2021 0946   HCT 37.3 12/20/2020 1103   PLT 208 03/10/2021 0946   PLT 184 12/20/2020 1103   MCV 82.3 03/10/2021 0946   MCV 80 12/20/2020 1103   MCV 82 12/30/2013 0355   MCH 27.2 03/10/2021 0946   MCHC 33.0 03/10/2021 0946   RDW 14.6 03/10/2021 0946   RDW 14.2  12/20/2020 1103   RDW 14.7 (H) 12/30/2013 0355   LYMPHSABS 1.6 03/10/2021 0946   LYMPHSABS 1.9 04/18/2020 1401   LYMPHSABS 0.7 (L) 12/30/2013 0355   MONOABS 0.6 03/10/2021 0946   MONOABS 0.1 (L) 12/30/2013 0355   EOSABS 0.1 03/10/2021 0946   EOSABS 0.2 04/18/2020 1401   EOSABS 0.0 12/30/2013 0355   BASOSABS 0.1 03/10/2021 0946   BASOSABS 0.1 04/18/2020 1401   BASOSABS 0.0 12/30/2013 0355   BMP Latest Ref Rng & Units 03/09/2021 03/09/2021 03/08/2021  Glucose 70 - 99 mg/dL 154(H) - 97  BUN 6 - 20 mg/dL 16 - 21(H)  Creatinine 0.44 - 1.00 mg/dL 1.38(H) 1.45(H) 1.58(H)  BUN/Creat Ratio 9 - 23 - - -  Sodium 135 - 145 mmol/L 143 - 143  Potassium 3.5 - 5.1 mmol/L 4.6 - 4.6  Chloride 98 - 111 mmol/L 108 - 115(H)  CO2 22 - 32 mmol/L 23 - 21(L)  Calcium 8.9 - 10.3 mg/dL 9.2 - 8.7(L)     Imaging Reviewed: 2/6 EEG: This was a markedly abnormal continuous video EEG due to diffuse slowing with continuous high voltage spiky GPDs, indicative of a widespread cerebral disturbance with epileptogenic potential. There was a transient improvement in morphology and frequency in the afternoon as medications were increased, however overnight there were some brief runs of increased 4hz   runs of spikes concerning for subclinical seizures.   1/10 MRI: 1.6 cm focus of curvilinear diffusion abnormality involving the deep white matter of the posterior left frontal centrum semi ovale as above. While this could potentially reflect a small focus of artifact/T2 shine through, a possible evolving subacute small vessel ischemic infarct could be considered in the correct clinical setting.  Impression:  New onset status in setting of UTI. seizure activity noted overnight on LTM with 4hz  spike rhythm.  vimpat increased   Recommendations: 1) Vimpat increased to 300mg  q12 2) recommend MRI with and without contrast seizure protocol when cEEG is discontinued  ATTENDING ATTESTATION:  This patient was transferred to [facility] for  subclinical status. Overnight EEG with 4hz  spike thythm. Vimpat doubled to 300mg  BID. On abx for UTI. Discussed with Dr. Deon Pilling, still having 1-2 hz GDP. Will monitor for now.  Dr. Reeves Forth evaluated pt independently, reviewed imaging, chart, labs. Discussed and formulated plan with the APP. Please see APP note above for details.     This patient is critically ill due to status epilepticus and at significant risk of neurological worsening, death form heart failure, respiratory failure,  seizure, sepsis. This patient's care requires constant monitoring of vital signs, hemodynamics, respiratory and cardiac monitoring, review of multiple databases, neurological assessment, discussion with family, other specialists and medical decision making of high complexity. I spent 35 minutes of neurocritical care time in the care of this patient.   Dandrae Kustra,MD

## 2021-03-10 NOTE — Procedures (Signed)
[facility]  Long-term EEG Report   Patient Name:   [name], [name] Date of Birth:   1970/11/06 Identification Number:   MRN - [id number]   Study Start Time:   03/10/2021 08:30:00  Study End Time:   03/10/2021 20:18:50  Indication: Encephalopathy,  Technical Summary: A routine 20 channel electroencephalogram using the international 10-20 system of electrode placement was performed.  Background: 5-6 Hz, Poorly formed  States       Awake      Drowsy: were seen during drowsiness      Asleep: were seen during asleep  Abnormalities  Generalized Slowing: Diffuse generalized slowing Background Slowing: The background consists of 20-50 uV, 5-6 Hz diffuse activity with superimposed diffuse polymorphic delta activity that is non reactive to external stimulation. Sharp wave: High amplitude generalized sharp wave discharges 1-4 hz high variability maximal in bicentral regions that do not evolve in frequency or morphology . They become more frequent with direct stimulation and change to more awake state.   Activation Procedures  Hyperventilation: Not performed  Photic Stimulation: Not performed  Classification: Abnormal :  Diagnosis:  1. Diffuse slowing consistent with generalized nonspecific neuronal dysfunction and encephalopathy.   2. Generalized sharp wave discharges most consistent with generalized encephalopathy or neuronal dysfunction. Cannot exclude epileptogenic potential but pattern better fits encephalopathy. No seizures noted.      Dr Apolinar Junes   [facility] [phone]  Case [id number]

## 2021-03-10 NOTE — Progress Notes (Signed)
LTM maintain done at bedside. No skin breakdown noted. Results pending ° °

## 2021-03-10 NOTE — Progress Notes (Signed)
Initial Nutrition Assessment  DOCUMENTATION CODES:   Non-severe (moderate) malnutrition in context of chronic illness  INTERVENTION:   Tube Feeding via Cortrak:  Osmolite 1.2 at 65 ml/hr Begin a 30 ml/hr; titrate by 10 mL q 8 hours until goal rate of 65 ml/hr This provides 87 g of protein, 1872 kcals and 1264 mL of free water  Pt with hx of gastric bypass; recommend checking appropriate vitamin/mineral labs to include copper, Vitamin B1, B6, B12, D. Plan to check CRP as well  Recommend bariatric surgery vitamin regimen. Plan to start MVI BID today; hold calcium supplementation for now   NUTRITION DIAGNOSIS:   Moderate Malnutrition related to chronic illness (gastric bypass) as evidenced by mild fat depletion, moderate fat depletion, mild muscle depletion, moderate muscle depletion.  GOAL:   Patient will meet greater than or equal to 90% of their needs  MONITOR:   Diet advancement, TF tolerance, Skin, Labs, Weight trends  REASON FOR ASSESSMENT:   Consult Enteral/tube feeding initiation and management  ASSESSMENT:   51 yo female admitted with AMS post prolonged seizure. PMH includes gastric bypass, DM, CAD, CKD, GIB, CVA with left sided weakness, subclinical status epilepticus  Pt with hx of gastric bypass. Currently NPO, plan for Cortrak  Continuous EEG   Pt is alert but does not follow commands or verbally communicate other than crying out during Cortrak placement.   Pt lives with her son (31 yo) who is at the bedside on visit today. Son reports pt has been eating well with good appetite PTA. Son reports pt does not appear to have lost any recent weight.   Pt has been in and out of hospital recently. Pt ambulates using a walker, some deficits from previous stroke.   Labs: reviewed Meds: ss novolog   NUTRITION - FOCUSED PHYSICAL EXAM:  Flowsheet Row Most Recent Value  Orbital Region Moderate depletion  Upper Arm Region No depletion  Thoracic and Lumbar Region  No depletion  Buccal Region Mild depletion  Temple Region Mild depletion  Clavicle Bone Region Moderate depletion  Clavicle and Acromion Bone Region Moderate depletion  Scapular Bone Region Moderate depletion  Dorsal Hand Unable to assess  Patellar Region Moderate depletion  Anterior Thigh Region Moderate depletion  Posterior Calf Region Mild depletion  Edema (RD Assessment) Mild       Diet Order:   Diet Order             Diet NPO time specified  Diet effective now                   EDUCATION NEEDS:   Not appropriate for education at this time  Skin:  Skin Assessment: Reviewed RN Assessment  Last BM:  2/5  Height:   Ht Readings from Last 1 Encounters:  03/10/21 5\' 3"  (1.6 m)    Weight:   Wt Readings from Last 1 Encounters:  03/10/21 73.7 kg     BMI:  Body mass index is 28.78 kg/m.  Estimated Nutritional Needs:   Kcal:  1750-1950 kcals  Protein:  85-100 g  Fluid:  >/= 1.8 L    Kerman Passey MS, RDN, LDN, CNSC Registered Dietitian III Clinical Nutrition RD Pager and On-Call Pager Number Located in [place]

## 2021-03-10 NOTE — Procedures (Signed)
[facility]   Stat 1 Hr EEG Report     Patient Name:   [name], [name] Date of Birth:   1970/03/18 Identification Number:   MRN - [id number]     Study Start Time:   03/10/2021 20:18:00   Study End Time:   03/10/2021 21:30:50   Indication: Encephalopathy,   Technical Summary: A routine 20 channel electroencephalogram using the international 10-20 system of electrode placement was performed.   Background: 5-6 Hz, Poorly formed   States        Awake      Drowsy: were seen during drowsiness      Asleep: were seen during asleep   Abnormalities   Generalized Slowing: Diffuse generalized slowing Background Slowing: The background consists of 20-50 uV, 5-6 Hz diffuse activity with superimposed diffuse polymorphic delta activity that is non reactive to external stimulation. Sharp wave: High amplitude generalized sharp wave discharges 1-4 hz high variability maximal in bicentral regions that do not evolve in frequency or morphology . They become more frequent with direct stimulation and change to more awake state.     Activation Procedures   Hyperventilation: Not performed   Photic Stimulation: Not performed   Classification: Abnormal :   Diagnosis:  1 hr EEG  Study Start Time:   03/10/2021 20:18:00   Study End Time:   03/10/2021 21:30:50   1. Diffuse slowing consistent with generalized nonspecific neuronal dysfunction and encephalopathy.    2. Generalized sharp wave discharges most consistent with generalized encephalopathy or neuronal dysfunction. Cannot exclude epileptogenic potential but pattern better fits encephalopathy.   No seizures noted.           Dr Apolinar Junes     [facility] [phone]  Case: [id number]

## 2021-03-10 NOTE — Procedures (Signed)
°  [facility] EEG Report STAT EEG STAT EEG Date 03/10/2021  Duration:  7:30 am to 8:30am Clinical Indication: suspected seizures EEG Procedure: This is a digitally recorded STAT EEG electroencephalogram. The international 10-20 electrode placement system is used for scalp electrode placement. Eighteen channels of scalp EEG are recorded. The data are stored digitally and reviewed in reformatted montages for optimal display. EEG Description: This EEG is not well organized.   The background consists of low voltage theta and delta activities intermixed with faster frequencies.  GPEDS (generalized pseudoperiodic epileptiform discharges) are present and occur at 2-3 per seconds.  There are no definite electrographic or electroclinical seizures.  There is no sleep architecture.  EKG: The EKG rhythm strip was not technically adequate for the evaluation of the cardiac rhythm. EEG Classification: Abnormal   GPEDS Background slowing, generalized EEG Interpretation: This stat EEG recorded in the comatose state is abnormal.  The GPEDS are a non-specific finding that can be seen in severe diffuse cerebral dysfunction and/or anoxic encephalopathy.  These can also be seen in non-convulsive status epilepticus, however, these GPEDS do not evolve, thus, favor severe anoxic encephalopathy/severe diffuse cerebral dysfunction.  Please correlate clinically.  There are no definite electrographic or electroclinical seizures.

## 2021-03-10 NOTE — Progress Notes (Deleted)
Evaluation Performed:  Follow-up visit  Date:  03/10/2021   ID:  [id number], [place] 1970/11/30, MRN [id number]  Patient Location:  [address] [place] [address]   Provider location:   [facility], [place] office  PCP:  [name], MD  Cardiologist:  [facility]  No chief complaint on file.   History of Present Illness:    [name] is a 51 y.o. female past medical history of smoking,  strokes,  2010 chest pain dating back to 2005  CAD, Previous LAD stent Catheterization May 2017 with stent placed to her first diagonal, there was residual 50% distal LAD disease, 30% proximal, 40% proximal circumflex  normal LV function,   s/p gastric bypass surgery, Has no stomach Baseline creatinine 1.5, previous ly taken off her diuretic Labile pressures GI bleed 12/21 into 02/2020, 10 units transfused Who presents for routine followup of her coronary artery disease  Was self cathing at home 4x a day, "not emptying bladder", has incontinence Was off her prophylactic ABX,  Recent hospitalization for UTI, encephalopathy Blood pressure elevated in the hospital, restarted on amlodipine  Now at home, Working with PT, BP  low Low today as well Continues to have dysuria, thinks her infection has persisted  MRI brain moderate chronic small vessel disease, cerebral atrophy, small focus of acute ischemia within the right middle cerebral peduncle Was on Plavix as outpatient  aspirin restarted at discharge  On stool softener for constipation Son is EMT, helps her at home Presents with walker today  Prior history of UTIs,  enterococcus, ecoli Most recently Klebsiella pneumonia  Takes Lasix very sparingly  Lab work reviewed CR 1.8  EKG personally reviewed by myself on todays visit Normal sinus rhythm with rate 60 bpm no significant ST or T wave changes  Echo 04/23/2020: normal EF  Other past medical history  reviewed Hospital records reviewed, 12/21 to 02/2020  balloon-assisted enteroscopy for management of an UGI bleed. Localization with nuclear imaging and CT scan suggested the source in the bypassed stomach or proximal duodenum.   hospitalized at [facility] with GI bleed, anemia and malnutrition. She was treated with 4 units of packed red blood cells and short-term enteral nutrition   Cath 12/2019 1.  Patent stents in the mid LAD and first diagonal with mild to moderate in-stent restenosis, stable moderate mid RCA stenosis, stable diffuse disease in the superior branch of the first diagonal, moderate mid LAD stenosis at the bifurcation of second diagonal which has no significant ostial disease.  There is severe progression of stenosis in the mid to distal LAD.  In addition, there is moderately severe disease in the apical LAD. 2.  Normal left ventricular end-diastolic pressure. 3.  Successful angioplasty and drug-eluting stent placement to the mid to distal LAD  Echocardiogram October 2021 Ejection fraction 60%  Prior hospitalizations  hypoglycemia 10/2019  Cath 04/2019  Non-stenotic 1st Diag lesion was previously treated. Ost 1st Diag lesion is 40% stenosed. Mid LAD-2 lesion is 5% stenosed. Ost 2nd Diag to 2nd Diag lesion is 95% stenosed. Mid LAD-1 lesion is 30% stenosed. Dist LAD lesion is 80% stenosed. Prox Cx lesion is 40% stenosed. Mid RCA lesion is 30% stenosed.   Past Medical History:  Diagnosis Date   Acute colitis 01/27/2017   Acute pyelonephritis    Acute upper GI bleed 01/25/2020   Anemia    iron deficiency anemia   Aortic  arch aneurysm    BRCA negative 2014   CAD (coronary artery disease)    a. 08/2003 Cath: LAD 30-40-med Rx; b. 11/2014 PCI: LAD 25m(3.25x23 Xience Alpine DES); c. 06/2015 PCI: D1 (2.25x12 Resolute Integrity DES); d. 06/2017 PCI: Patent mLAD stent, D2 95 (PTCA); e. 09/2017 PCI: D2 99ost (CBA); d. 12/2017 Cath: LM nl, LAD 355m80d (small), D1  40ost, D2 95ost, LCX 40p, RCA 40ost/p->Med rx for D2 given restenosis.   Closed nondisplaced intertrochanteric fracture of left femur (HCOdessa9/10/2018   Colitis 06/03/2015   Colon polyp    CVA (cerebral vascular accident) (HCSel[facility]gro[facility]   Left side weakness.    Degenerative tear of glenoid labrum of right shoulder 03/15/2017   Diabetes mellitus without complication (HCKodi[place]   Family history of breast cancer    BRCA neg 2014   Femur fracture, left (HCClaremont9/10/2018   Gastric ulcer 04/05/70/9021 Helicobacter pylori infection 11/23/2014   History of echocardiogram    a. 03/2017 Echo: EF 60-65%, no rwma; b. 02/2018 Echo: EF 60-65%, no rwma. Nl RV fxn. No cardiac source of emboli (admitted w/ stroke).   Hypertension    Iron deficiency anemia secondary to blood loss (chronic)    Malignant melanoma of skin of scalp (HCC)    MI, acute, non ST segment elevation (HCC)    Neuromuscular disorder (HCC)    S/P drug eluting coronary stent placement 06/04/2015   Sepsis ([facility]CD[facility] [place]2/14/2019   Sepsis secondary to UTI (HCPills[place]3/22/2022   Stroke (HCNora   a. 02/2018 MRI: 47m59mate acute/early subacute L medial frontal lobe inarct; b. 02/2018 MRA No large vessel occlusion or aneurysm. Mod to sev L P2 stenosis. thready L vertebral artery, diffusely dzs'd; c. 02/2018 Carotid U/S: <50% bilat ICA dzs.   Unspecified atrial fibrillation (HCCGran[place]/22/2022   Past Surgical History:  Procedure Laterality Date   APPENDECTOMY     BALLOON ENTEROSCOPY  02/06/2020   [facility]   BIOPSY N/A 03/14/2020   Procedure: BIOPSY;  Surgeon: WohLucilla LameD;  Location: [facility]: [facility]oscopy;  Laterality: N/A;   CARDIAC CATHETERIZATION N/A 11/09/2014   Procedure: Coronary Angiography;  Surgeon: TimMinna MerrittsD;  [facility];  Service: Cardiovascular;  Laterality: N/A;   CARDIAC CATHETERIZATION N/A 11/12/2014   Procedure: Coronary Stent Intervention;  Surgeon: AleIsaias CowmanD;  Location: [facility] [facility];  Service:  Cardiovascular;  Laterality: N/A;   CARDIAC CATHETERIZATION N/A 04/18/2015   Procedure: Left Heart Cath and Coronary Angiography;  Surgeon: TimMinna MerrittsD;  [place]: [facility] [facility];  Service: Cardiovascular;  Laterality: N/A;   CARDIAC CATHETERIZATION Left 06/04/2015   Procedure: Left Heart Cath and Coronary Angiography;  Surgeon: MuhWellington HampshireD;  Location: [facility];  Service: Cardiovascular;  Laterality: Left;   CARDIAC CATHETERIZATION N/A 06/04/2015   Procedure: Coronary Stent Intervention;  Surgeon: MuhWellington HampshireD;  [facility];  Service: Cardiovascular;  Laterality: N/A;   CESAREAN SECTION  2001   CHOLECYSTECTOMY N/A 11/18/2016   Procedure: LAPAROSCOPIC CHOLECYSTECTOMY WITH INTRAOPERATIVE CHOLANGIOGRAM;  Surgeon: SanChristene LyeD;  [facility];  Service: General;  Laterality: N/A;   COLONOSCOPY WITH PROPOFOL N/A 04/27/2016   Procedure: COLONOSCOPY WITH PROPOFOL;  Surgeon: DarLucilla LameD;  Location: [facility]: [place]oscopy;  Laterality: N/A;   COLONOSCOPY WITH PROPOFOL N/A 01/12/2018   Procedure: COLONOSCOPY WITH PROPOFOL;  Surgeon: TolAlice ReicherteoBenay PikeD;  [facility];  Service: Endoscopy;  Laterality: N/A;   COLONOSCOPY WITH PROPOFOL N/A 03/14/2020   Procedure: COLONOSCOPY WITH PROPOFOL;  Surgeon: [name], MD;  Location: [place];  Service: Endoscopy;  Laterality: N/A;  Needs to be scheduled after 7:30 due to driver issues   CORONARY ANGIOPLASTY     CORONARY BALLOON ANGIOPLASTY N/A 06/29/2017   Procedure: CORONARY BALLOON ANGIOPLASTY;  Surgeon: Wellington Hampshire, MD;  Location: [place] CV LAB;  Service: Cardiovascular;  Laterality: N/A;   CORONARY BALLOON ANGIOPLASTY N/A 09/20/2017   Procedure: CORONARY BALLOON ANGIOPLASTY;  Surgeon: Wellington Hampshire, MD;  Location: [place] CV LAB;  Service: Cardiovascular;  Laterality: N/A;   CORONARY STENT INTERVENTION N/A 12/13/2019    Procedure: CORONARY STENT INTERVENTION;  Surgeon: Wellington Hampshire, MD;  Location: [place] CV LAB;  Service: Cardiovascular;  Laterality: N/A;   DILATION AND CURETTAGE OF UTERUS     ESOPHAGOGASTRODUODENOSCOPY (EGD) WITH PROPOFOL N/A 09/14/2014   Procedure: ESOPHAGOGASTRODUODENOSCOPY (EGD) WITH PROPOFOL;  Surgeon: Josefine Class, MD;  Location[facility];  Service: Endoscopy;  Laterality: N/A;   ESOPHAGOGASTRODUODENOSCOPY (EGD) WITH PROPOFOL N/A 04/27/2016   Procedure: ESOPHAGOGASTRODUODENOSCOPY (EGD) WITH PROPOFOL;  Surgeon: [name], MD;  Location: [place];  Service: Endoscopy;  Laterality: N/A;  Diabetic - oral meds   ESOPHAGOGASTRODUODENOSCOPY (EGD) WITH PROPOFOL N/A 01/12/2018   Procedure: ESOPHAGOGASTRODUODENOSCOPY (EGD) WITH PROPOFOL;  Surgeon: [place], Benay Pike, MD;  Location[facility];  Service: Endoscopy;  Laterality: N/A;   ESOPHAGOGASTRODUODENOSCOPY (EGD) WITH PROPOFOL N/A 04/11/2019   Procedure: ESOPHAGOGASTRODUODENOSCOPY (EGD) WITH PROPOFOL;  Surgeon: [name], MD;  Location: [facility];  Service: Endoscopy;  Laterality: N/A;   ESOPHAGOGASTRODUODENOSCOPY (EGD) WITH PROPOFOL N/A 03/08/2020   Procedure: ESOPHAGOGASTRODUODENOSCOPY (EGD) WITH PROPOFOL;  Surgeon: [name], MD;  Location: [facility] ENDOSCOPY;  Service: Endoscopy;  Laterality: N/A;   GASTRIC BYPASS  09/2009   [facility]    INTRAMEDULLARY (IM) NAIL INTERTROCHANTERIC Left 10/13/2018   Procedure: INTRAMEDULLARY (IM) NAIL INTERTROCHANTRIC;  Surgeon: Leandrew Koyanagi, MD;  Location: [place];  Service: Orthopedics;  Laterality: Left;   Left Carotid to sublcavian artery bypass w/ subclavian artery ligation     a. Performed @ [facility].   LEFT HEART CATH AND CORONARY ANGIOGRAPHY Left 06/29/2017   Procedure: LEFT HEART CATH AND CORONARY ANGIOGRAPHY;  Surgeon: Wellington Hampshire, MD;  Location: [place] CV LAB;  Service: Cardiovascular;  Laterality: Left;   LEFT HEART CATH AND  CORONARY ANGIOGRAPHY N/A 09/20/2017   Procedure: LEFT HEART CATH AND CORONARY ANGIOGRAPHY;  Surgeon: Wellington Hampshire, MD;  Location: [place] CV LAB;  Service: Cardiovascular;  Laterality: N/A;   LEFT HEART CATH AND CORONARY ANGIOGRAPHY N/A 12/20/2017   Procedure: LEFT HEART CATH AND CORONARY ANGIOGRAPHY;  Surgeon: Wellington Hampshire, MD;  Location: [name] CV LAB;  Service: Cardiovascular;  Laterality: N/A;   LEFT HEART CATH AND CORONARY ANGIOGRAPHY N/A 04/20/2019   Procedure: LEFT HEART CATH AND CORONARY ANGIOGRAPHY possible PCI;  Surgeon: Minna Merritts, MD;  Location: [place] CV LAB;  Service: Cardiovascular;  Laterality: N/A;   LEFT HEART CATH AND CORONARY ANGIOGRAPHY N/A 12/13/2019   Procedure: LEFT HEART CATH AND CORONARY ANGIOGRAPHY;  Surgeon: Wellington Hampshire, MD;  Location: [place] CV LAB;  Service: Cardiovascular;  Laterality: N/A;   MELANOMA EXCISION  2016   Dr. Henreitta Cea ABLATION  2002   RIGHT OOPHORECTOMY     SHOULDER ARTHROSCOPY WITH OPEN ROTATOR CUFF REPAIR Right  01/07/2016   Procedure: SHOULDER ARTHROSCOPY WITH DEBRIDMENT, SUBACHROMIAL DECOMPRESSION;  Surgeon: Corky Mull, MD;  Location: [facility];  Service: Orthopedics;  Laterality: Right;   SHOULDER ARTHROSCOPY WITH OPEN ROTATOR CUFF REPAIR Right 03/16/2017   Procedure: SHOULDER ARTHROSCOPY WITH OPEN ROTATOR CUFF REPAIR POSSIBLE BICEPS TENODESIS;  Surgeon: Corky Mull, MD;  Location: [facility];  Service: Orthopedics;  Laterality: Right;   TRIGGER FINGER RELEASE Right     Middle Finger      Allergies:   Lipitor [atorvastatin]   Social History   Tobacco Use   Smoking status: Former    Packs/day: 0.25    Years: 1.00    Pack years: 0.25    Types: Cigarettes    Quit date: 08/31/1994    Years since quitting: 26.5   Smokeless tobacco: Never   Tobacco comments:    quit 28 years ago  Vaping Use   Vaping Use: Never used  Substance Use Topics   Alcohol use: No    Alcohol/week: 0.0 standard  drinks   Drug use: No     Current Facility-Administered Medications on File Prior to Visit  Medication Dose Route Frequency Provider Last Rate Last Admin   aspirin chewable tablet 81 mg  81 mg Per Tube Daily Pierce, Dwayne A, RPH   81 mg at 03/10/21 0930   carvedilol (COREG) tablet 12.5 mg  12.5 mg Per Tube BID WC Pierce, Dwayne A, RPH   12.5 mg at 03/10/21 0730   Chlorhexidine Gluconate Cloth 2 % PADS 6 each  6 each Topical Daily Frederik Pear, MD   6 each at 03/10/21 0934   clopidogrel (PLAVIX) tablet 75 mg  75 mg Per Tube Daily Joselyn Glassman A, RPH   75 mg at 03/10/21 0930   docusate (COLACE) 50 MG/5ML liquid 100 mg  100 mg Per Tube BID PRN Levada Dy, Dwayne A, RPH       feeding supplement (OSMOLITE 1.2 CAL) liquid 1,000 mL  1,000 mL Per Tube Continuous Magdalen Spatz, NP       heparin injection 5,000 Units  5,000 Units Subcutaneous Q8H Estill Cotta, NP   5,000 Units at 03/10/21 1325   hydrALAZINE (APRESOLINE) tablet 50 mg  50 mg Per Tube TID Levada Dy, Dwayne A, RPH   50 mg at 03/10/21 0930   insulin aspart (novoLOG) injection 0-9 Units  0-9 Units Subcutaneous Q4H Estill Cotta, NP   1 Units at 03/10/21 1209   lacosamide (VIMPAT) 300 mg in sodium chloride 0.9 % 25 mL IVPB  300 mg Intravenous Q12H Dorene Grebe, MD       lactated ringers infusion   Intravenous Continuous Estill Cotta, NP   Stopped at 03/10/21 0833   levETIRAcetam (KEPPRA) IVPB 1500 mg/ 100 mL premix  1,500 mg Intravenous Q12H Hunsucker, Bonna Gains, MD   Paused at 03/10/21 0946   levothyroxine (SYNTHROID) tablet 25 mcg  25 mcg Per Tube QAC breakfast Levada Dy, Dwayne A, RPH       multivitamin with minerals tablet 1 tablet  1 tablet Per Tube BID Magdalen Spatz, NP       ondansetron [facility]) injection 4 mg  4 mg Intravenous Q6H PRN Frederik Pear, MD   4 mg at 03/09/21 1712   piperacillin-tazobactam (ZOSYN) IVPB 3.375 g  3.375 g Intravenous Once Maryjane Hurter, MD 12.5 mL/hr at 03/10/21 1300 Infusion Verify  at 03/10/21 1300   piperacillin-tazobactam (ZOSYN) IVPB 3.375 g  3.375 g Intravenous Q8H Meier,  Hortencia Conradi, MD       polyethylene glycol [facility] / GLYCOLAX) packet 17 g  17 g Per Tube Daily PRN Joselyn Glassman A, RPH       rosuvastatin (CRESTOR) tablet 40 mg  40 mg Per Tube q1800 Joselyn Glassman A, RPH   40 mg at 03/09/21 1711   Current Outpatient Medications on File Prior to Visit  Medication Sig Dispense Refill   AIMOVIG 140 MG/ML SOAJ Inject 140 mg into the skin every 28 (twenty-eight) days.     ALPRAZolam (XANAX) 1 MG tablet Take 1 tablet by mouth twice daily (Patient taking differently: Take 1 mg by mouth 2 (two) times daily as needed for anxiety.) 60 tablet 5   aspirin EC 81 MG EC tablet Take 1 tablet (81 mg total) by mouth daily. Swallow whole. 30 tablet 1   Baclofen 5 MG TABS Take 5 mg by mouth 2 (two) times daily as needed (muscle spasms).     carvedilol (COREG) 12.5 MG tablet Take 1 tablet by mouth twice daily (Patient taking differently: 12.5 mg 2 (two) times daily with a meal.) 60 tablet 11   clopidogrel (PLAVIX) 75 MG tablet Take 1 tablet (75 mg total) by mouth daily. 90 tablet 3   desvenlafaxine (PRISTIQ) 50 MG 24 hr tablet Take 50 mg by mouth daily.     diclofenac Sodium (VOLTAREN) 1 % GEL Apply 2 g topically 4 (four) times daily as needed (joint paim).     diphenoxylate-atropine (LOMOTIL) 2.5-0.025 MG tablet Take 1 tablet by mouth 4 (four) times daily as needed for diarrhea or loose stools. 120 tablet 5   estradiol (ESTRACE) 0.1 MG/GM vaginal cream Estrogen Cream Instruction Discard applicator Apply pea sized amount to tip of finger to urethra before bed. Wash hands well after application. Use Monday, Wednesday and Friday (Patient taking differently: Place 1 Applicatorful vaginally 3 (three) times a week. Estrogen Cream Instruction Discard applicator Apply pea sized amount to tip of finger to urethra before bed. Wash hands well after application. Use Monday, Wednesday and Friday) 42.5  g 12   furosemide (LASIX) 20 MG tablet Take 1 tablet by mouth every day as needed for swelling (Patient taking differently: Take 20 mg by mouth daily as needed (swelling).) 30 tablet 6   gabapentin (NEURONTIN) 300 MG capsule Take 2 capsules (600 mg total) by mouth 2 (two) times daily. 360 capsule 4   glucose 4 GM chewable tablet Chew 1 tablet (4 g total) by mouth 3 (three) times daily as needed for low blood sugar. Home med. 50 tablet 12   hydrALAZINE (APRESOLINE) 50 MG tablet Take 1 tablet (50 mg total) by mouth 3 (three) times daily. 90 tablet 0   isosorbide mononitrate (IMDUR) 120 MG 24 hr tablet Take 1 tablet by mouth every day (Patient taking differently: Take 120 mg by mouth daily.) 30 tablet 3   lacosamide (VIMPAT) 50 MG TABS tablet Take 1 tablet (50 mg total) by mouth 2 (two) times daily. 60 tablet 0   levETIRAcetam (KEPPRA) 750 MG tablet Take 2 tablets (1,500 mg total) by mouth 2 (two) times daily. 120 tablet 0   levothyroxine (SYNTHROID) 25 MCG tablet Take 1 tablet by mouth 30 minutes before breakfast (Patient taking differently: Take 25 mcg by mouth daily before breakfast.) 30 tablet 5   mirabegron ER (MYRBETRIQ) 50 MG TB24 tablet Take 1 tablet (50 mg total) by mouth daily. 30 tablet 11   nitrofurantoin (MACRODANTIN) 100 MG capsule Hold while on IV antibiotic. (  Patient taking differently: Take 100 mg by mouth 2 (two) times daily.) 30 capsule 11   nitroGLYCERIN (NITROSTAT) 0.4 MG SL tablet Dissolve 1 tablet under tongue every 24mns, up to 3 doses, for chest pain. Max 3 doses in 373ms. (Patient taking differently: Place 0.4 mg under the tongue every 5 (five) minutes x 3 doses as needed for chest pain.) 25 tablet 11   pantoprazole (PROTONIX) 40 MG tablet Take 1 tablet (40 mg total) by mouth 2 (two) times daily. 180 tablet 1   Polyethyl Glycol-Propyl Glycol (SYSTANE OP) Place 1 drop into both eyes daily as needed (dry eyes).     promethazine (PHENERGAN) 25 MG tablet Take 1 tablet (25 mg total)  by mouth every 6 (six) hours as needed for nausea or vomiting. 30 tablet 11   Rimegepant Sulfate (NURTEC) 75 MG TBDP Take 75 mg by mouth as needed. (Patient taking differently: Take 75 mg by mouth as needed (migraine).) 12 tablet 11   rosuvastatin (CRESTOR) 40 MG tablet Take 1 tablet (40 mg total) by mouth daily at 6 PM. (Patient not taking: Reported on 03/08/2021) 90 tablet 3   traZODone (DESYREL) 100 MG tablet Take 1 to 2 tablets by mouth at bedtime (Patient not taking: Reported on 03/09/2021) 30 tablet 11     Family Hx: The patient's family history includes Anxiety disorder in her father and mother; Bipolar disorder in her mother; Breast cancer in an other family member; Breast cancer (age of onset: 5039in her maternal aunt and maternal aunt; Colon cancer in her cousin and father; Depression in her father, mother, and sister; Diabetes in her father and sister; Heart disease in her mother; Heart disease (age of onset: 4038in her father; Hyperlipidemia in her mother and sister; Hypertension in her father, mother, sister, and sister; Kidney cancer in her cousin; Kidney disease in her father and sister; Ovarian cancer in her cousin; Skin cancer in her father; Stroke in her father; Thyroid nodules in her sister. There is no history of Bladder Cancer.  ROS:   Please see the history of present illness.    Review of Systems  Constitutional: Negative.   HENT: Negative.    Respiratory: Negative.    Cardiovascular: Negative.   Gastrointestinal: Negative.   Genitourinary:  Positive for dysuria.  Musculoskeletal: Negative.   Neurological: Negative.   Psychiatric/Behavioral: Negative.    All other systems reviewed and are negative.  Labs/Other Tests and Data Reviewed:    Recent Labs: 03/08/2021: ALT 24 03/09/2021: TSH 3.550 03/10/2021: BUN 23; Creatinine, Ser 1.46; Hemoglobin 14.6; Magnesium 1.5; Platelets 208; Potassium 4.5; Sodium 136   Recent Lipid Panel Lab Results  Component Value Date/Time   CHOL  158 02/08/2021 07:02 AM   CHOL 159 05/28/2016 12:12 PM   CHOL 197 12/30/2013 03:55 AM   TRIG 117 02/08/2021 07:02 AM   TRIG 261 (H) 12/30/2013 03:55 AM   HDL 40 (L) 02/08/2021 07:02 AM   HDL 34 (L) 05/28/2016 12:12 PM   HDL 28 (L) 12/30/2013 03:55 AM   CHOLHDL 4.0 02/08/2021 07:02 AM   LDLCALC 95 02/08/2021 07:02 AM   LDLCALC 103 (H) 12/08/2016 12:21 PM   LDLCALC 117 (H) 12/30/2013 03:55 AM   LDLDIRECT 83.3 01/26/2011 09:13 AM    Wt Readings from Last 3 Encounters:  03/10/21 162 lb 7.7 oz (73.7 kg)  03/08/21 176 lb 5.9 oz (80 kg)  02/18/21 151 lb 14.4 oz (68.9 kg)     Exam:    Vital Signs: Vital  signs may also be detailed in the HPI There were no vitals taken for this visit.  Constitutional:  oriented to person, place, and time. No distress.  Walking with a walker HENT:  Head: Grossly normal Eyes:  no discharge. No scleral icterus.  Neck: No JVD, no carotid bruits  Cardiovascular: Regular rate and rhythm, no murmurs appreciated Pulmonary/Chest: Clear to auscultation bilaterally, no wheezes or rails Abdominal: Soft.  no distension.  no tenderness.  Musculoskeletal: Normal range of motion Neurological:  normal muscle tone. Coordination normal. No atrophy Skin: Skin warm and dry Psychiatric: normal affect, pleasant   ASSESSMENT & PLAN:    Problem List Items Addressed This Visit   None Coronary artery disease with stable angina Currently with no symptoms of angina. No further workup at this time. Continue current medication regimen.  Urinary tract infection Prior history of UTI, Enterococcus, E. Coli Recent hospitalization UTI Klebsiella pneumonia requiring IV antibiotics, presented with encephalopathy -Blood pressure low on today's visit, still with dysuria after recent discharge -Contacted urology, appreciate their assistance, they will arrange follow-up  Hypotension Hold amlodipine for now, could be low in the setting of UTI Further testing per urology, she will  continue to monitor blood pressure at home  History of stroke Continue  Plavix BP well controlled  GI bleed: Followed by Dr. Allen Norris Back on aspirin with Plavix Recommend she be on guard for recurrent GI bleeds  Anemia: On aspirin Plavix, will need periodic monitoring of CBC   Total encounter time more than 25 minutes  Greater than 50% was spent in counseling and coordination of care with the patient    Signed, Ida Rogue, [place] [id number], [place], [place] [address]

## 2021-03-11 ENCOUNTER — Other Ambulatory Visit: Payer: Self-pay | Admitting: Urology

## 2021-03-11 ENCOUNTER — Ambulatory Visit: Payer: Medicare Other

## 2021-03-11 ENCOUNTER — Ambulatory Visit: Payer: Medicare Other | Admitting: Cardiovascular Disease

## 2021-03-11 DIAGNOSIS — I25118 Atherosclerotic heart disease of native coronary artery with other forms of angina pectoris: Secondary | ICD-10-CM

## 2021-03-11 DIAGNOSIS — I951 Orthostatic hypotension: Secondary | ICD-10-CM

## 2021-03-11 DIAGNOSIS — E785 Hyperlipidemia, unspecified: Secondary | ICD-10-CM

## 2021-03-11 DIAGNOSIS — N390 Urinary tract infection, site not specified: Secondary | ICD-10-CM

## 2021-03-11 DIAGNOSIS — E1159 Type 2 diabetes mellitus with other circulatory complications: Secondary | ICD-10-CM

## 2021-03-11 DIAGNOSIS — I5022 Chronic systolic (congestive) heart failure: Secondary | ICD-10-CM

## 2021-03-11 DIAGNOSIS — N183 Chronic kidney disease, stage 3 unspecified: Secondary | ICD-10-CM

## 2021-03-11 DIAGNOSIS — R569 Unspecified convulsions: Secondary | ICD-10-CM | POA: Diagnosis not present

## 2021-03-11 DIAGNOSIS — I255 Ischemic cardiomyopathy: Secondary | ICD-10-CM

## 2021-03-11 DIAGNOSIS — E782 Mixed hyperlipidemia: Secondary | ICD-10-CM

## 2021-03-11 DIAGNOSIS — I6523 Occlusion and stenosis of bilateral carotid arteries: Secondary | ICD-10-CM

## 2021-03-11 DIAGNOSIS — I739 Peripheral vascular disease, unspecified: Secondary | ICD-10-CM

## 2021-03-11 DIAGNOSIS — R4182 Altered mental status, unspecified: Secondary | ICD-10-CM | POA: Diagnosis not present

## 2021-03-11 DIAGNOSIS — I6329 Cerebral infarction due to unspecified occlusion or stenosis of other precerebral arteries: Secondary | ICD-10-CM

## 2021-03-11 DIAGNOSIS — I152 Hypertension secondary to endocrine disorders: Secondary | ICD-10-CM

## 2021-03-11 DIAGNOSIS — I10 Essential (primary) hypertension: Secondary | ICD-10-CM

## 2021-03-11 LAB — MAGNESIUM
Magnesium: 2.6 mg/dL — ABNORMAL HIGH (ref 1.7–2.4)
Magnesium: 2.5 mg/dL — ABNORMAL HIGH (ref 1.7–2.4)

## 2021-03-11 LAB — GLUCOSE, CAPILLARY
Glucose-Capillary: 135 mg/dL — ABNORMAL HIGH (ref 70–99)
Glucose-Capillary: 157 mg/dL — ABNORMAL HIGH (ref 70–99)
Glucose-Capillary: 161 mg/dL — ABNORMAL HIGH (ref 70–99)
Glucose-Capillary: 173 mg/dL — ABNORMAL HIGH (ref 70–99)
Glucose-Capillary: 179 mg/dL — ABNORMAL HIGH (ref 70–99)
Glucose-Capillary: 211 mg/dL — ABNORMAL HIGH (ref 70–99)

## 2021-03-11 LAB — CBC WITH DIFFERENTIAL/PLATELET
Abs Immature Granulocytes: 0.03 10*3/uL (ref 0.00–0.07)
Basophils Absolute: 0 10*3/uL (ref 0.0–0.1)
Basophils Relative: 0 %
Eosinophils Absolute: 0.1 10*3/uL (ref 0.0–0.5)
Eosinophils Relative: 1 %
HCT: 43.4 % (ref 36.0–46.0)
Hemoglobin: 14.8 g/dL (ref 12.0–15.0)
Immature Granulocytes: 0 %
Lymphocytes Relative: 13 %
Lymphs Abs: 1.2 10*3/uL (ref 0.7–4.0)
MCH: 27.5 pg (ref 26.0–34.0)
MCHC: 34.1 g/dL (ref 30.0–36.0)
MCV: 80.7 fL (ref 80.0–100.0)
Monocytes Absolute: 0.7 10*3/uL (ref 0.1–1.0)
Monocytes Relative: 7 %
Neutro Abs: 7.7 10*3/uL (ref 1.7–7.7)
Neutrophils Relative %: 79 %
Platelets: 193 10*3/uL (ref 150–400)
RBC: 5.38 MIL/uL — ABNORMAL HIGH (ref 3.87–5.11)
RDW: 14.7 % (ref 11.5–15.5)
WBC: 9.7 10*3/uL (ref 4.0–10.5)
nRBC: 0 % (ref 0.0–0.2)

## 2021-03-11 LAB — COMPREHENSIVE METABOLIC PANEL
ALT: 25 U/L (ref 0–44)
AST: 20 U/L (ref 15–41)
Albumin: 3.3 g/dL — ABNORMAL LOW (ref 3.5–5.0)
Alkaline Phosphatase: 129 U/L — ABNORMAL HIGH (ref 38–126)
Anion gap: 13 (ref 5–15)
BUN: 33 mg/dL — ABNORMAL HIGH (ref 6–20)
CO2: 23 mmol/L (ref 22–32)
Calcium: 9.2 mg/dL (ref 8.9–10.3)
Chloride: 100 mmol/L (ref 98–111)
Creatinine, Ser: 1.67 mg/dL — ABNORMAL HIGH (ref 0.44–1.00)
GFR, Estimated: 37 mL/min — ABNORMAL LOW (ref >60)
Glucose, Bld: 183 mg/dL — ABNORMAL HIGH (ref 70–99)
Potassium: 4.2 mmol/L (ref 3.5–5.1)
Sodium: 136 mmol/L (ref 135–145)
Total Bilirubin: 0.5 mg/dL (ref 0.3–1.2)
Total Protein: 6.8 g/dL (ref 6.5–8.1)

## 2021-03-11 LAB — URINE CULTURE: Culture: >=100000 — AB

## 2021-03-11 LAB — PHOSPHORUS
Phosphorus: 4.2 mg/dL (ref 2.5–4.6)
Phosphorus: 4.3 mg/dL (ref 2.5–4.6)

## 2021-03-11 LAB — LACOSAMIDE: Lacosamide: 8.6 ug/mL (ref 5.0–10.0)

## 2021-03-11 LAB — C-REACTIVE PROTEIN: CRP: 0.7 mg/dL (ref <1.0)

## 2021-03-11 MED ORDER — INSULIN ASPART 100 UNIT/ML IJ SOLN
0.0000 [IU] | INTRAMUSCULAR | Status: DC
  Administered 2021-03-11: 2 [IU] via SUBCUTANEOUS
  Administered 2021-03-11 (×2): 3 [IU] via SUBCUTANEOUS
  Administered 2021-03-12 (×2): 2 [IU] via SUBCUTANEOUS
  Administered 2021-03-12 – 2021-03-13 (×5): 3 [IU] via SUBCUTANEOUS
  Administered 2021-03-13: 5 [IU] via SUBCUTANEOUS
  Administered 2021-03-13: 2 [IU] via SUBCUTANEOUS
  Administered 2021-03-13: 3 [IU] via SUBCUTANEOUS
  Administered 2021-03-13 – 2021-03-14 (×2): 2 [IU] via SUBCUTANEOUS
  Administered 2021-03-14: 3 [IU] via SUBCUTANEOUS
  Administered 2021-03-14 – 2021-03-15 (×4): 2 [IU] via SUBCUTANEOUS
  Administered 2021-03-15: 3 [IU] via SUBCUTANEOUS
  Administered 2021-03-16: 8 [IU] via SUBCUTANEOUS
  Administered 2021-03-16 – 2021-03-17 (×2): 3 [IU] via SUBCUTANEOUS
  Administered 2021-03-17: 2 [IU] via SUBCUTANEOUS
  Administered 2021-03-17: 5 [IU] via SUBCUTANEOUS
  Administered 2021-03-17 – 2021-03-18 (×2): 3 [IU] via SUBCUTANEOUS
  Administered 2021-03-18 (×2): 2 [IU] via SUBCUTANEOUS
  Administered 2021-03-19: 5 [IU] via SUBCUTANEOUS
  Administered 2021-03-19: 3 [IU] via SUBCUTANEOUS

## 2021-03-11 MED ORDER — SULFAMETHOXAZOLE-TRIMETHOPRIM 800-160 MG PO TABS
1.0000 | ORAL_TABLET | Freq: Two times a day (BID) | ORAL | Status: DC
  Administered 2021-03-11 – 2021-03-14 (×7): 1
  Filled 2021-03-11 (×9): qty 1

## 2021-03-11 MED ORDER — CHLORHEXIDINE GLUCONATE 0.12 % MT SOLN
15.0000 mL | Freq: Two times a day (BID) | OROMUCOSAL | Status: DC
  Administered 2021-03-11 – 2021-03-20 (×17): 15 mL via OROMUCOSAL
  Filled 2021-03-11 (×14): qty 15

## 2021-03-11 MED ORDER — ONDANSETRON HCL 4 MG/2ML IJ SOLN
INTRAMUSCULAR | Status: AC
  Administered 2021-03-11: 4 mg via INTRAVENOUS
  Filled 2021-03-11: qty 2

## 2021-03-11 MED ORDER — ORAL CARE MOUTH RINSE
15.0000 mL | Freq: Two times a day (BID) | OROMUCOSAL | Status: DC
  Administered 2021-03-11 – 2021-03-17 (×10): 15 mL via OROMUCOSAL

## 2021-03-11 MED ORDER — CARVEDILOL 12.5 MG PO TABS
25.0000 mg | ORAL_TABLET | Freq: Two times a day (BID) | ORAL | Status: DC
  Administered 2021-03-11 – 2021-03-13 (×6): 25 mg
  Filled 2021-03-11 (×6): qty 2

## 2021-03-11 NOTE — Procedures (Signed)
EEG Procedure CPT/Type of Study: 42683; 24hr EEG with video Referring Provider: Hunsucker Primary Neurological Diagnosis: status epilepticus  History: This is a 51 yr old patient, undergoing an EEG to evaluate for status epilepticus. Clinical State: disoriented  Technical Description:  The EEG was performed using standard setting per the guidelines of American Clinical Neurophysiology Society (ACNS).  A minimum of 21 electrodes were placed on scalp according to the International 10-20 or/and 10-10 Systems. Supplemental electrodes were placed as needed. Single EKG electrode was also used to detect cardiac arrhythmia. Patient's behavior was continuously recorded on video simultaneously with EEG. A minimum of 16 channels were used for data display. Each epoch of study was reviewed manually daily and as needed using standard referential and bipolar montages. Computerized quantitative EEG analysis (such as compressed spectral array analysis, trending, automated spike & seizure detection) were used as indicated.   Day 3: from 0730 03/10/21 to 0730 03/11/21  EEG Description: Overall Amplitude:Normal Predominant Frequency: The background activity showed theta , with about 4-6 Hz, that was frequent. Superimposed Frequencies: frequent delta and some beta activity bilaterally The background was symmetric  Background Abnormalities: Generalized slowing; delta-theta slowing as above Rhythmic or periodic pattern: Generalized periodic discharges; frequent 1-2Hz  sharp GPDs, triphasic appearance at times Epileptiform activity: Yes; frequent high voltage sharp 1-2Hz  GPDs with improved abundance and morphology Electrographic seizures: No Events: no   Breach rhythm: no  Reactivity: Present  Stimulation procedures:  Hyperventilation: not done Photic stimulation: not done  Sleep Background: Stage II  EKG:no significant arrhythmia  Impression: This was a markedly abnormal continuous video EEG due to  diffuse slowing with improving GPDs, now with more of a SIRPID or triphasic appearance, indicative of a severe encephalopathy pattern. No further seizures were seen.

## 2021-03-11 NOTE — Progress Notes (Signed)
LTM maintenance performed. No skin breakdown on the frontal leads. Patients test button was tested.

## 2021-03-11 NOTE — Progress Notes (Signed)
CCM MD aware that patient cannot be seen by radiology until tomorrow for CorTrak readjustment. While tube feed is not flowing through the tube, medications are easily flushed through. Continue to use for medications until adjusted.

## 2021-03-11 NOTE — Progress Notes (Addendum)
Neurology Progress Note  Brief HPI: [name], is a 51 y.o. female, who presented to the [facility] ED as a transfer from [facility] ED with a chief complaint of AMS. She has a pertinent past medical history of subclinical status epilepticus, CAD, DM2, CKD, GIB, CVA with left sided weakness, hypothyroidism.  On the AM of 2/4 she was reportedly in her usual state of health, however she became increasingly confused and altered as the day progressed. She was given 1000mg  of IV keppra at [facility] and the loaded again with 1000mg  of keppra, 2mg  ativan, and 200mg  IV vimpat at the [facility] ED. ED course was notable for a positive UA which the patient was started on rocephin. Currently on cEEG  Subjective: Patient is more alert today per family and RN staff, she is following some commands.  Son is at bedside.   Exam: Vitals:   03/11/21 0900 03/11/21 1000  BP: (!) 148/80 (!) 160/130  Pulse: 83 94  Resp: 11 13  Temp:    SpO2: 97% 96%   Gen: In bed, NAD Resp: non-labored breathing, no acute distress Abd: soft, nt  Neuro: Mental Status: Eyes open, does not follow commands . Cranial Nerves:Pupils midline, equal 96mm and reactive, Will track examiner today and eyes crossed midline. No gaze deviation.no facial asymmetry noted She was able to tell me her name after some time and multiple attempts. Sticks her tongue out.  Motor: hand grasp strong  to command. Moves ext spontaneously.  Sensory:sensation to light touch intact DTR: 2+ in all extremities Gait: Deferred  Pertinent Labs: CBC    Component Value Date/Time   WBC 9.7 03/11/2021 0451   RBC 5.38 (H) 03/11/2021 0451   HGB 14.8 03/11/2021 0451   HGB 11.9 12/20/2020 1103   HCT 43.4 03/11/2021 0451   HCT 37.3 12/20/2020 1103   PLT 193 03/11/2021 0451   PLT 184 12/20/2020 1103   MCV 80.7 03/11/2021 0451   MCV 80 12/20/2020 1103   MCV 82 12/30/2013 0355   MCH 27.5 03/11/2021 0451   MCHC 34.1 03/11/2021 0451   RDW 14.7 03/11/2021 0451   RDW 14.2  12/20/2020 1103   RDW 14.7 (H) 12/30/2013 0355   LYMPHSABS 1.2 03/11/2021 0451   LYMPHSABS 1.9 04/18/2020 1401   LYMPHSABS 0.7 (L) 12/30/2013 0355   MONOABS 0.7 03/11/2021 0451   MONOABS 0.1 (L) 12/30/2013 0355   EOSABS 0.1 03/11/2021 0451   EOSABS 0.2 04/18/2020 1401   EOSABS 0.0 12/30/2013 0355   BASOSABS 0.0 03/11/2021 0451   BASOSABS 0.1 04/18/2020 1401   BASOSABS 0.0 12/30/2013 0355   BMP Latest Ref Rng & Units 03/11/2021 03/10/2021 03/10/2021  Glucose 70 - 99 mg/dL 183(H) - 140(H)  BUN 6 - 20 mg/dL 33(H) - 23(H)  Creatinine 0.44 - 1.00 mg/dL 1.67(H) - 1.46(H)  BUN/Creat Ratio 9 - 23 - - -  Sodium 135 - 145 mmol/L 136 136 137  Potassium 3.5 - 5.1 mmol/L 4.2 4.5 4.3  Chloride 98 - 111 mmol/L 100 - 99  CO2 22 - 32 mmol/L 23 - 23  Calcium 8.9 - 10.3 mg/dL 9.2 - 9.1     Imaging Reviewed: 2/6 EEG: This was a markedly abnormal continuous video EEG due to diffuse slowing with continuous high voltage spiky GPDs, indicative of a widespread cerebral disturbance with epileptogenic potential. There was a transient improvement in morphology and frequency in the afternoon as medications were increased, however overnight there were some brief runs of increased 4hz  runs of  spikes concerning for subclinical seizures.   2/7: EEG: diffuse slowing. No seizures.   1/10 MRI: 1.6 cm focus of curvilinear diffusion abnormality involving the deep white matter of the posterior left frontal centrum semi ovale as above. While this could potentially reflect a small focus of artifact/T2 shine through, a possible evolving subacute small vessel ischemic infarct could be considered in the correct clinical setting.  Impression:  New onset status in setting of UTI.on  vimpat. Doing better overall, no seizures on EEG. Encephalopathy: likely secondary to medication/infectious UTI-on abx followed by CCM   Recommendations: 1) Vimpat 300mg  q12 (no seizures on EEG, do not want to oversedate so no further increase  today). Continue LTM 2) recommend MRI with and without contrast seizure protocol when cEEG is discontinued 3) Follow encephalopathy may improve with supportive care and abx.    This patient is critically ill due to status epilepticus and at significant risk of neurological worsening, death form heart failure, respiratory failure,  seizure, sepsis. This patient's care requires constant monitoring of vital signs, hemodynamics, respiratory and cardiac monitoring, review of multiple databases, neurological assessment, discussion with family, other specialists and medical decision making of high complexity. I spent 35 minutes of neurocritical care time in the care of this patient.   Plan discussed with Dr. Verlee Monte.  Addis Tuohy,MD

## 2021-03-11 NOTE — Progress Notes (Addendum)
NAME:  Maleny Candy, MRN:  [id number], DOB:  29-May-1970, LOS: 3 ADMISSION DATE:  03/08/2021, CONSULTATION DATE: 2/4 REFERRING MD: Karle Starch, CHIEF COMPLAINT:  AMS   History of Present Illness:  Patient is encephalopathic. Therefore history has been obtained from chart review.   [place] [address] [name], is a 51 y.o. female, who presented to the [facility] ED as a transfer from [facility] ED with a chief complaint of AMS who needed continuous EEG.   They have a pertinent past medical history of subclinical status epilepticus, CAD, DM2, CKD, GIB, CVA with left sided weakness, hypothyroidism.   On the AM of 2/4 she was reportedly in her usual state of health, however she became increasingly confused and altered as the day progressed. Per family she had a similar presentation in Jan 2023 and was noted to be in status epilepticus.   She was given 1000mg  of IV keppra at [facility] and the loaded again with 1000mg  of keppra, 2mg  ativan, and 200mg  IV vimpat at the [facility] ED. ED course was notable for a positive UA which the patient was started on rocephin. Head CT negative for acute process. She was noted to be having apneic spells in the ED. She has been seen by neurology at [facility]. She was started on continuous EEG.  PCCM was consulted for admission.   Pertinent  Medical History  subclinical status epilepticus, CAD, DM2, CKD, GIB, CVA with lef sided weakness.   Significant Hospital Events: Including procedures, antibiotic start and stop dates in addition to other pertinent events   2/4 presented with altered mental status, CTH neg, UA +, EEG> 2/6 EEG >> This was a markedly abnormal continuous video EEG due to diffuse slowing with continuous high voltage spiky GPDs, indicative of a widespread cerebral disturbance with epileptogenic potential. There was a transient improvement in morphology and frequency in the afternoon as medications were increased, however overnight there were some brief runs of increased  4hz  runs of spikes concerning for subclinical seizures.   Interim History / Subjective:  Patient wakes up to name. Follows some commands such as wiggles toes/sticks tongue out. Did not squeeze finger and move extremity to command. PERRL.  Afebrile; WBC trending down; on zosyn for UTI; Urine culture pending UOP last 24 hours 1 L; increase in creat/BUN Core track in place; tube feeds running  Objective   Blood pressure (!) 165/96, pulse (!) 114, temperature 98.1 F (36.7 C), temperature source Axillary, resp. rate 14, height 5\' 3"  (1.6 m), weight 73.7 kg, SpO2 96 %.        Intake/Output Summary (Last 24 hours) at 03/11/2021 2706 Last data filed at 03/11/2021 0700 Gross per 24 hour  Intake 1241.8 ml  Output 1050 ml  Net 191.8 ml      Examination: General: NAD HEENT: MM pink/moist; cor track in place Neuro: wakes up to name. Follows some commands such as wiggles toes/sticks tongue out. Did not squeeze finger and move extremity to command. PERRL. CV: s1s2, RRR, no m/r/g PULM:  dim clear BS bilaterally; on room air GI: soft, bsx4 active  Extremities: warm/dry, no edema  Skin: no rashes or lesions appreciated  EEG 2/6: GPEDS favor gereralized nonspecific neuronal dysfunction and severe anoxic encephalopathy. No definited electroclinical seizures  Resolved Hospital Problem list     Assessment & Plan:  Altered mental status, ?post ictal Prolonged seizure HX status epilepticus, epilepsy HX CVA CT head negative for acute intracranial abnormality. Covid/Flu neg. VBG 7.33/48/49/25. CXR:  no evidence of pneumonia, effusion, ptx. UA positive for nitrite, many bacteria. ?subclinical seizures. P: -Neurology following; appreciate recs -continue AED: keppra and vimpat -EEG -seizure precautions -prn tylenol for normothermia -cor track placed yesterday w/ TF running; continue closely monitor due to patients poor mental status for aspiration.  Apnea P: -ABG showing respiratory  alkalosis -on room air with sats in upper 90s -continue telemetry monitoring  Hypomag on 2/5: improved P: -trend mag -replete for goal >2  UTI HX of E faecium UTI UA positive for nitrite, many bacteria P: -continue zosyn -urine culture pending  DM2 P: -SSI and CBG monitoring  HX CAD Hx of HTN Troponin HS 13 -Resume ASA, plavix, rosuvastatin  -SBP slightly elevated this am; increasing coreg to 25 mg bid -continue hydralazine 50 mg tid  AKI on CKDIIIb P: -Trend BMP / urinary output -Replace electrolytes as indicated -Avoid nephrotoxic agents, ensure adequate renal perfusion  Hypothyroisidm P: -cont synthroid   Best Practice (right click and "Reselect all SmartList Selections" daily)   Diet/type: tubefeeds and NPO w/ meds via tube DVT prophylaxis: prophylactic heparin  GI prophylaxis: PPI Lines: N/A Foley:  N/A Code Status:  full code Last date of multidisciplinary goals of care discussion [2/7 spoke w/ [name] (son) over phone and updated]   Critical care time: n/a    Ethelene [facility] 03/11/2021, 7:53 AM  Please see [email or web address] for pager details.  From 7A-7P if no response, please call [phone]. After hours, please call ELink [phone].

## 2021-03-12 ENCOUNTER — Inpatient Hospital Stay (HOSPITAL_COMMUNITY): Payer: Medicare Other

## 2021-03-12 ENCOUNTER — Inpatient Hospital Stay: Payer: Self-pay

## 2021-03-12 DIAGNOSIS — E1159 Type 2 diabetes mellitus with other circulatory complications: Secondary | ICD-10-CM | POA: Diagnosis not present

## 2021-03-12 DIAGNOSIS — I161 Hypertensive emergency: Secondary | ICD-10-CM

## 2021-03-12 DIAGNOSIS — I152 Hypertension secondary to endocrine disorders: Secondary | ICD-10-CM | POA: Diagnosis not present

## 2021-03-12 DIAGNOSIS — M7989 Other specified soft tissue disorders: Secondary | ICD-10-CM

## 2021-03-12 DIAGNOSIS — N390 Urinary tract infection, site not specified: Secondary | ICD-10-CM | POA: Diagnosis not present

## 2021-03-12 DIAGNOSIS — R4182 Altered mental status, unspecified: Secondary | ICD-10-CM | POA: Diagnosis not present

## 2021-03-12 LAB — GLUCOSE, CAPILLARY
Glucose-Capillary: 110 mg/dL — ABNORMAL HIGH (ref 70–99)
Glucose-Capillary: 126 mg/dL — ABNORMAL HIGH (ref 70–99)
Glucose-Capillary: 127 mg/dL — ABNORMAL HIGH (ref 70–99)
Glucose-Capillary: 170 mg/dL — ABNORMAL HIGH (ref 70–99)
Glucose-Capillary: 190 mg/dL — ABNORMAL HIGH (ref 70–99)
Glucose-Capillary: 99 mg/dL (ref 70–99)

## 2021-03-12 LAB — BASIC METABOLIC PANEL
Anion gap: 14 (ref 5–15)
BUN: 28 mg/dL — ABNORMAL HIGH (ref 6–20)
CO2: 22 mmol/L (ref 22–32)
Calcium: 8.9 mg/dL (ref 8.9–10.3)
Chloride: 100 mmol/L (ref 98–111)
Creatinine, Ser: 1.48 mg/dL — ABNORMAL HIGH (ref 0.44–1.00)
GFR, Estimated: 42 mL/min — ABNORMAL LOW (ref >60)
Glucose, Bld: 115 mg/dL — ABNORMAL HIGH (ref 70–99)
Potassium: 4.1 mmol/L (ref 3.5–5.1)
Sodium: 136 mmol/L (ref 135–145)

## 2021-03-12 LAB — CBC
HCT: 42.6 % (ref 36.0–46.0)
Hemoglobin: 14.5 g/dL (ref 12.0–15.0)
MCH: 27.8 pg (ref 26.0–34.0)
MCHC: 34 g/dL (ref 30.0–36.0)
MCV: 81.8 fL (ref 80.0–100.0)
Platelets: 157 10*3/uL (ref 150–400)
RBC: 5.21 MIL/uL — ABNORMAL HIGH (ref 3.87–5.11)
RDW: 14.6 % (ref 11.5–15.5)
WBC: 8.7 10*3/uL (ref 4.0–10.5)
nRBC: 0 % (ref 0.0–0.2)

## 2021-03-12 LAB — MAGNESIUM: Magnesium: 2.2 mg/dL (ref 1.7–2.4)

## 2021-03-12 LAB — COPPER, SERUM: Copper: 126 ug/dL (ref 80–158)

## 2021-03-12 LAB — VITAMIN B6: Vitamin B6: 2.6 ug/L — ABNORMAL LOW (ref 3.4–65.2)

## 2021-03-12 LAB — VITAMIN B1: Vitamin B1 (Thiamine): 141.2 nmol/L (ref 66.5–200.0)

## 2021-03-12 MED ORDER — SODIUM CHLORIDE 0.9% FLUSH
10.0000 mL | Freq: Two times a day (BID) | INTRAVENOUS | Status: DC
  Administered 2021-03-12 – 2021-03-19 (×15): 10 mL

## 2021-03-12 MED ORDER — VENLAFAXINE HCL 37.5 MG PO TABS
37.5000 mg | ORAL_TABLET | Freq: Two times a day (BID) | ORAL | Status: DC
  Administered 2021-03-12 – 2021-03-17 (×10): 37.5 mg
  Filled 2021-03-12 (×12): qty 1

## 2021-03-12 MED ORDER — BACLOFEN 5 MG HALF TABLET
5.0000 mg | ORAL_TABLET | Freq: Two times a day (BID) | ORAL | Status: DC
  Filled 2021-03-12: qty 1

## 2021-03-12 MED ORDER — SODIUM CHLORIDE 0.9% FLUSH: 10.0000 mL | INTRAVENOUS | Status: DC | PRN

## 2021-03-12 MED ORDER — ISOSORBIDE MONONITRATE 20 MG PO TABS
40.0000 mg | ORAL_TABLET | Freq: Two times a day (BID) | ORAL | Status: DC
  Filled 2021-03-12: qty 2

## 2021-03-12 MED ORDER — LEVETIRACETAM 100 MG/ML PO SOLN
1500.0000 mg | Freq: Two times a day (BID) | ORAL | Status: DC
  Administered 2021-03-12 – 2021-03-13 (×2): 1500 mg
  Filled 2021-03-12 (×2): qty 15

## 2021-03-12 MED ORDER — AMLODIPINE BESYLATE 5 MG PO TABS
5.0000 mg | ORAL_TABLET | Freq: Every day | ORAL | Status: DC
  Administered 2021-03-12: 5 mg
  Filled 2021-03-12: qty 1

## 2021-03-12 MED ORDER — LACOSAMIDE 200 MG PO TABS
300.0000 mg | ORAL_TABLET | Freq: Two times a day (BID) | ORAL | Status: DC
  Administered 2021-03-12 – 2021-03-13 (×2): 300 mg
  Filled 2021-03-12 (×2): qty 2

## 2021-03-12 MED ORDER — OSMOLITE 1.2 CAL PO LIQD
1000.0000 mL | ORAL | Status: DC
  Administered 2021-03-12 – 2021-03-14 (×3): 1000 mL

## 2021-03-12 MED ORDER — ONDANSETRON HCL 4 MG/2ML IJ SOLN
4.0000 mg | Freq: Four times a day (QID) | INTRAMUSCULAR | Status: DC | PRN
  Administered 2021-03-12 – 2021-03-17 (×8): 4 mg via INTRAVENOUS
  Filled 2021-03-12 (×8): qty 2

## 2021-03-12 MED ORDER — ISOSORBIDE MONONITRATE 20 MG PO TABS
40.0000 mg | ORAL_TABLET | Freq: Two times a day (BID) | ORAL | Status: DC
  Administered 2021-03-12 – 2021-03-13 (×4): 40 mg
  Filled 2021-03-12 (×4): qty 2

## 2021-03-12 MED ORDER — ACETAMINOPHEN 160 MG/5ML PO SOLN: 650.0000 mg | Freq: Four times a day (QID) | ORAL | Status: DC | PRN

## 2021-03-12 MED ORDER — CLEVIDIPINE BUTYRATE 0.5 MG/ML IV EMUL
0.0000 mg/h | INTRAVENOUS | Status: DC
  Administered 2021-03-12: 7 mg/h via INTRAVENOUS
  Filled 2021-03-12: qty 50

## 2021-03-12 MED ORDER — HYDRALAZINE HCL 20 MG/ML IJ SOLN
10.0000 mg | INTRAMUSCULAR | Status: DC | PRN
  Administered 2021-03-12 – 2021-03-16 (×3): 20 mg via INTRAVENOUS
  Filled 2021-03-12 (×6): qty 1

## 2021-03-12 MED ORDER — VENLAFAXINE HCL 37.5 MG PO TABS: 37.5000 mg | ORAL_TABLET | Freq: Two times a day (BID) | ORAL | Status: DC

## 2021-03-12 MED ORDER — GABAPENTIN 250 MG/5ML PO SOLN
100.0000 mg | Freq: Two times a day (BID) | ORAL | Status: DC
  Administered 2021-03-12 – 2021-03-17 (×11): 100 mg
  Filled 2021-03-12 (×13): qty 2

## 2021-03-12 MED ORDER — LABETALOL HCL 5 MG/ML IV SOLN
10.0000 mg | INTRAVENOUS | Status: DC | PRN
  Administered 2021-03-12 – 2021-03-17 (×6): 20 mg via INTRAVENOUS
  Filled 2021-03-12 (×7): qty 4

## 2021-03-12 MED ORDER — BACLOFEN 10 MG PO TABS
5.0000 mg | ORAL_TABLET | Freq: Two times a day (BID) | ORAL | Status: DC
  Administered 2021-03-12 – 2021-03-16 (×7): 5 mg
  Filled 2021-03-12 (×9): qty 1

## 2021-03-12 MED ORDER — GABAPENTIN 250 MG/5ML PO SOLN
100.0000 mg | Freq: Two times a day (BID) | ORAL | Status: DC
  Filled 2021-03-12: qty 2

## 2021-03-12 NOTE — Progress Notes (Addendum)
NAME:  [name], MRN:  [id number], DOB:  09-08-1970, LOS: 4 ADMISSION DATE:  03/08/2021, CONSULTATION DATE: 2/4 REFERRING MD: Karle Starch, CHIEF COMPLAINT:  AMS   History of Present Illness:  Patient is encephalopathic. Therefore history has been obtained from chart review.   [place] [address] [name], is a 51 y.o. female, who presented to the [facility] ED as a transfer from [facility] ED with a chief complaint of AMS who needed continuous EEG.   They have a pertinent past medical history of subclinical status epilepticus, CAD, DM2, CKD, GIB, CVA with left sided weakness, hypothyroidism.   On the AM of 2/4 she was reportedly in her usual state of health, however she became increasingly confused and altered as the day progressed. Per family she had a similar presentation in Jan 2023 and was noted to be in status epilepticus.   She was given 1000mg  of IV keppra at [facility] and the loaded again with 1000mg  of keppra, 2mg  ativan, and 200mg  IV vimpat at the [facility] ED. ED course was notable for a positive UA which the patient was started on rocephin. Head CT negative for acute process. She was noted to be having apneic spells in the ED. She has been seen by neurology at [facility]. She was started on continuous EEG.  PCCM was consulted for admission.   Pertinent  Medical History  subclinical status epilepticus, CAD, DM2, CKD, GIB, CVA with lef sided weakness.   Significant Hospital Events: Including procedures, antibiotic start and stop dates in addition to other pertinent events   2/4 presented with altered mental status, CTH neg, UA +, EEG> 2/6 EEG >> This was a markedly abnormal continuous video EEG due to diffuse slowing with continuous high voltage spiky GPDs, indicative of a widespread cerebral disturbance with epileptogenic potential. There was a transient improvement in morphology and frequency in the afternoon as medications were increased, however overnight there were some brief runs of increased  4hz  runs of spikes concerning for subclinical seizures.  2/7 started on bactrim for UA; mental status slowly improving (wiggles toes and sticks tongue out to command; responds to name) 2/8 BP elevated overnight  Interim History / Subjective:  SBP elevated up to 200s overnight requiring prns Started on bactrim yesterday per urine culture  Mental status continues to slowly improve. Following some commands: wiggles toes, squeezes fingers, stick tongue out. PERRL  Objective   Blood pressure (!) 142/80, pulse 64, temperature (!) 97.3 F (36.3 C), temperature source Axillary, resp. rate (!) 9, height 5\' 3"  (1.6 m), weight 73.7 kg, SpO2 96 %.        Intake/Output Summary (Last 24 hours) at 03/12/2021 4944 Last data filed at 03/12/2021 0400 Gross per 24 hour  Intake 877.71 ml  Output 850 ml  Net 27.71 ml      Examination: General: NAD HEENT: MM pink/moist; cor track in place Neuro: wakes up to name. Following some commands: wiggles toes, squeezes fingers, stick tongue out.  CV: s1s2, RRR, no m/r/g PULM:  dim clear BS bilaterally; on room air GI: soft, bsx4 active  Extremities: warm/dry, no edema  Skin: no rashes or lesions appreciated   Resolved Hospital Problem list     Assessment & Plan:  Altered mental status, ?post ictal Prolonged seizure HX status epilepticus, epilepsy HX CVA CT head negative for acute intracranial abnormality. Covid/Flu neg. VBG 7.33/48/49/25. CXR: no evidence of pneumonia, effusion, ptx. UA positive for nitrite, many bacteria. ?subclinical seizures. P: -Neurology following;  appreciate recs -continue AED: keppra and vimpat -cEEG -MRI recommended without contrast when eeg dc'd -avoid sedating meds -seizure precautions in place -prn tylenol for normothermia -aspiration risk due to mental status; cor trak in place  Apnea Episodes of apnea on monitor; no desaturations and ABG showing resp alkalosis P: -continue telemetry monitoring  Hypomag on 2/5:  improved P: -trend mag -replete for goal >2  UTI HX of E faecium UTI UA positive for nitrite, many bacteria UC showing enterobacter cloacae; 2/7 zosyn switched to bactrim P: -zosyn changed to bactrim on 2/7  DM2 P: -SSI and CBG monitoring  HX CAD Hx of HTN Troponin HS 13 P: -continue ASA, plavix, rosuvastatin  -SBP elevated to 200s overnight requiring prns -adding amlodipine 5 mg -continue carvedilol and hydralazine -prn hydralazine and labetalol for HTN  AKI on CKDIIIb: slowly improving P: -Trend BMP / urinary output -Replace electrolytes as indicated -Avoid nephrotoxic agents, ensure adequate renal perfusion  Hypothyroisidm P: -cont synthroid   Best Practice (right click and "Reselect all SmartList Selections" daily)   Diet/type: tubefeeds and NPO w/ meds via tube DVT prophylaxis: prophylactic heparin  GI prophylaxis: PPI Lines: N/A Foley:  N/A Code Status:  full code Last date of multidisciplinary goals of care discussion [2/7 spoke w/ [name] (son) over phone and updated]   Critical care time: n/a    Everlene [place] [facility] 03/12/2021, 7:13 AM  Please see [email or web address] for pager details.  From 7A-7P if no response, please call [phone]. After hours, please call ELink [phone].

## 2021-03-12 NOTE — Progress Notes (Signed)
[place] Progress Note Patient Name: [name] DOB: [birth year] MRN: [id number]   Date of Service  03/12/2021  HPI/Events of Note  Patient with sub-optimal blood pressure control.  eICU Interventions  PRN iv Hydralazine ordered.        Kerry Kass Nazair Fortenberry 03/12/2021, 3:35 AM

## 2021-03-12 NOTE — Progress Notes (Addendum)
Neurology Progress Note  Brief HPI: [name], is a 51 y.o. female, who presented to the [facility] ED as a transfer from [facility] ED with a chief complaint of AMS. She has a pertinent past medical history of subclinical status epilepticus, CAD, DM2, CKD, GIB, CVA with left sided weakness, hypothyroidism.  On the AM of 2/4 she was reportedly in her usual state of health, however she became increasingly confused and altered as the day progressed. She was given 1000mg  of IV keppra at [facility] and the loaded again with 1000mg  of keppra, 2mg  ativan, and 200mg  IV vimpat at the [facility] ED. ED course was notable for a positive UA which the patient was started on rocephin. Currently on cEEG  Subjective: Patient is more alert today. She complains of a headache. She is moving all extremities. She is oriented to person, place, situation and age. She follows commands, slow to respond. She is on Cleviprex gtt @ 7mg /hr. EEG with generalized slowing no seizures. NO family at bedside  Exam: Vitals:   03/12/21 1115 03/12/21 1154  BP: (!) 161/82   Pulse: 81   Resp: 15   Temp:  98.1 F (36.7 C)  SpO2: 97%    Gen: In bed, NAD Resp: non-labored breathing, no acute distress Abd: soft, nt  Neuro: Mental Status: Eyes open, following commands. Can tell me her name, she is at [facility], age but not year or her DOB. Slow to answer questions but can get them out with time. Cranial Nerves:Pupils midline, equal 34mm and reactive, Will track examiner today and eyes crossed midline. No gaze deviation.no facial asymmetry noted. Speech and movements delayed Motor: hand grasp strong  to command. Moves ext spontaneously and to command with delay. Sensory:sensation to light touch intact Gait: Deferred    Imaging Reviewed: 2/6 EEG: This was a markedly abnormal continuous video EEG due to diffuse slowing with continuous high voltage spiky GPDs, indicative of a widespread cerebral disturbance with epileptogenic potential. There was  a transient improvement in morphology and frequency in the afternoon as medications were increased, however overnight there were some brief runs of increased 4hz  runs of spikes concerning for subclinical seizures.   2/7: EEG: diffuse slowing. No seizures.   1/10 MRI: 1.6 cm focus of curvilinear diffusion abnormality involving the deep white matter of the posterior left frontal centrum semi ovale as above. While this could potentially reflect a small focus of artifact/T2 shine through, a possible evolving subacute small vessel ischemic infarct could be considered in the correct clinical setting.  Impression:  New onset status in setting of UTI.on  vimpat and Keppra 1500MG  BID. Doing better overall, no seizures on EEG. Encephalopathy: likely secondary to medication/infectious UTI-on abx followed by CCM   Recommendations: 1) Vimpat 300mg  q12.  2) Continue Keppra 1500mg  BID 3) Discontinue LTM 2) recommend MRI with and without contrast seizure protocol when cEEG is discontinued 3) Follow encephalopathy may improve with supportive care and abx.  Beulah Gandy, DNP, ACNPC-AG  ATTENDING ATTESTATION:  No seizures on LTM overnight, appears slow with likely triphasic waves. She is encephalopathic but improving and can answer orientation questions and follow commands with delay. D/C LTM. On vimpat and keppra. If she does not improve by tomorrow consider Brain MRI. Last MRI was on 02/10/21.  Discussed with Dr. Verlee Monte.  Dr. Reeves Forth evaluated pt independently, reviewed imaging, chart, labs. Discussed and formulated plan with the APP. Please see APP note above for details.     This patient is critically  ill due to respiratory distress, stroke s/p tPA and at significant risk of neurological worsening, death form heart failure, respiratory failure, recurrent stroke, bleeding from [facility] seizure, sepsis. This patient's care requires constant monitoring of vital signs, hemodynamics, respiratory and cardiac  monitoring, review of multiple databases, neurological assessment, discussion with family, other specialists and medical decision making of high complexity. I spent 35 minutes of neurocritical care time in the care of this patient.   Ramzy Cappelletti,MD

## 2021-03-12 NOTE — Progress Notes (Signed)
[place] Progress Note Patient Name: [name] DOB: 21-Nov-1970 MRN: [id number]   Date of Service  03/12/2021  HPI/Events of Note  Blood pressure remains high despite PRN Hydralazine.  eICU Interventions  PRN Labetalol d fr BP not controlled by Hydralazine.        Kerry Kass Kate Sweetman 03/12/2021, 5:24 AM

## 2021-03-12 NOTE — Progress Notes (Addendum)
[place] Progress Note Patient Name: [name] DOB: 1970-10-10 MRN: [id number]   Date of Service  03/12/2021  HPI/Events of Note  Patient having dystonic type reactions, she was on Baclofen at home for "muscle spasms". She was alert and interactive during the episodes.  eICU Interventions  Baclofen home medication resumed.        Kerry Kass Georgene Kopper 03/12/2021, 10:07 PM

## 2021-03-12 NOTE — Progress Notes (Signed)
Lower extremity venous has been completed.   Preliminary results in CV Proc.   Jishnu Jenniges Peytan Andringa 03/12/2021 2:10 PM

## 2021-03-12 NOTE — Progress Notes (Signed)
LTM D/C'd. Patient had no skin breakdown. [facility] notified.

## 2021-03-12 NOTE — Procedures (Signed)
EEG Procedure CPT/Type of Study: 72094; 24hr EEG with video Referring Provider: [place] Primary Neurological Diagnosis: status epilepticus  History: This is a 51 yr old patient, undergoing an EEG to evaluate for status epilepticus. Clinical State: disoriented  Technical Description:  The EEG was performed using standard setting per the guidelines of American Clinical Neurophysiology Society (ACNS).  A minimum of 21 electrodes were placed on scalp according to the International 10-20 or/and 10-10 Systems. Supplemental electrodes were placed as needed. Single EKG electrode was also used to detect cardiac arrhythmia. Patient's behavior was continuously recorded on video simultaneously with EEG. A minimum of 16 channels were used for data display. Each epoch of study was reviewed manually daily and as needed using standard referential and bipolar montages. Computerized quantitative EEG analysis (such as compressed spectral array analysis, trending, automated spike & seizure detection) were used as indicated.   Day 4: from 0730 03/11/21 to 0730 03/12/21  EEG Description: Overall Amplitude:Normal Predominant Frequency: The background activity showed theta , with about 4-6 Hz, that was frequent. Superimposed Frequencies: frequent delta and some beta activity bilaterally The background was symmetric  Background Abnormalities: Generalized slowing; delta-theta slowing as above Rhythmic or periodic pattern: Generalized periodic discharges; frequent 1-2Hz  sharp GPDs, triphasic appearance at times, unchanged Epileptiform activity: Yes; frequent high voltage sharp 1-2Hz  GPDs, stable over the past 24 hours Electrographic seizures: No Events: no   Breach rhythm: no  Reactivity: Present  Stimulation procedures:  Hyperventilation: not done Photic stimulation: not done  Sleep Background: Stage II  EKG:no significant arrhythmia  Impression: This was a markedly abnormal continuous video EEG due to  diffuse slowing with improving GPDs, now with more of a SIRPID or triphasic appearance, indicative of a severe encephalopathy pattern. No further seizures were seen. The study was stable/unchanged over the past 24 hours.

## 2021-03-12 NOTE — Progress Notes (Signed)
Patient nauseous and blood pressure 202/123. No neuro change. Contacted CCM because patient has no PRN orders for BP or Nausea.

## 2021-03-12 NOTE — Progress Notes (Signed)
This RN at bedside as second assess for IV/midline placement attempt. Using ultrasound on left upper arm, unable to visualize basilic or cephalic veins at all, brachial only visible once in axilla. Ultrasound then used to assess right upper arm, again basilic and cephalic veins not visualized, but brachial vein visualized and appears appropriate size. Given that this is only vessel appropriate for sticking, and patient needs more than one access, discussed with RN that midline not most appropriate line choice at this time. PICC or other CVC recommended. Informed RN that unable to give a time when PICC team would be able to place line. Primary RN called and spoke with Andres Labrum, PA, and states to order PICC at this time. PICC ordered per verbal.

## 2021-03-12 NOTE — Progress Notes (Signed)
Peripherally Inserted Central Catheter Placement  The IV Nurse has discussed with the patient and/or persons authorized to consent for the patient, the purpose of this procedure and the potential benefits and risks involved with this procedure.  The benefits include less needle sticks, lab draws from the catheter, and the patient may be discharged home with the catheter. Risks include, but not limited to, infection, bleeding, blood clot (thrombus formation), and puncture of an artery; nerve damage and irregular heartbeat and possibility to perform a PICC exchange if needed/ordered by physician.  Alternatives to this procedure were also discussed.  Bard Power PICC patient education guide, fact sheet on infection prevention and patient information card has been provided to patient /or left at bedside.   Telephone consent with son   PICC Placement Documentation  PICC Double Lumen 03/12/21 PICC Right Brachial 34 cm 0 cm (Active)  Indication for Insertion or Continuance of Line Poor Vasculature-patient has had multiple peripheral attempts or PIVs lasting less than 24 hours 03/12/21 1700  Exposed Catheter (cm) 0 cm 03/12/21 1700  Site Assessment Clean;Dry;Intact 03/12/21 1700  Lumen #1 Status Flushed;Saline locked;Blood return noted 03/12/21 1700  Lumen #2 Status Flushed;Saline locked;Blood return noted 03/12/21 1700  Dressing Type Transparent;Securing device 03/12/21 1700  Dressing Status Clean;Dry;Intact 03/12/21 1700  Antimicrobial disc in place? Yes 03/12/21 1700  Safety Lock Not Applicable 91/50/56 9794  Line Care Connections checked and tightened 03/12/21 1700  Dressing Intervention New dressing 03/12/21 1700  Dressing Change Due 03/19/21 03/12/21 1700       Darlyn Read 03/12/2021, 5:43 PM

## 2021-03-13 DIAGNOSIS — R4182 Altered mental status, unspecified: Secondary | ICD-10-CM | POA: Diagnosis not present

## 2021-03-13 LAB — CBC
HCT: 35.6 % — ABNORMAL LOW (ref 36.0–46.0)
Hemoglobin: 11.6 g/dL — ABNORMAL LOW (ref 12.0–15.0)
MCH: 27.4 pg (ref 26.0–34.0)
MCHC: 32.6 g/dL (ref 30.0–36.0)
MCV: 84 fL (ref 80.0–100.0)
Platelets: 166 10*3/uL (ref 150–400)
RBC: 4.24 MIL/uL (ref 3.87–5.11)
RDW: 14.8 % (ref 11.5–15.5)
WBC: 9.8 10*3/uL (ref 4.0–10.5)
nRBC: 0 % (ref 0.0–0.2)

## 2021-03-13 LAB — BASIC METABOLIC PANEL
Anion gap: 8 (ref 5–15)
BUN: 32 mg/dL — ABNORMAL HIGH (ref 6–20)
CO2: 26 mmol/L (ref 22–32)
Calcium: 8.3 mg/dL — ABNORMAL LOW (ref 8.9–10.3)
Chloride: 101 mmol/L (ref 98–111)
Creatinine, Ser: 1.83 mg/dL — ABNORMAL HIGH (ref 0.44–1.00)
GFR, Estimated: 33 mL/min — ABNORMAL LOW (ref >60)
Glucose, Bld: 152 mg/dL — ABNORMAL HIGH (ref 70–99)
Potassium: 3.9 mmol/L (ref 3.5–5.1)
Sodium: 135 mmol/L (ref 135–145)

## 2021-03-13 LAB — BLOOD GAS, ARTERIAL
Acid-Base Excess: 1.9 mmol/L (ref 0.0–2.0)
Bicarbonate: 25.8 mmol/L (ref 20.0–28.0)
Drawn by: 51155
FIO2: 21
O2 Saturation: 94.9 %
Patient temperature: 37
pCO2 arterial: 38.7 mmHg (ref 32.0–48.0)
pH, Arterial: 7.438 (ref 7.350–7.450)
pO2, Arterial: 76.2 mmHg — ABNORMAL LOW (ref 83.0–108.0)

## 2021-03-13 LAB — GLUCOSE, CAPILLARY
Glucose-Capillary: 123 mg/dL — ABNORMAL HIGH (ref 70–99)
Glucose-Capillary: 150 mg/dL — ABNORMAL HIGH (ref 70–99)
Glucose-Capillary: 158 mg/dL — ABNORMAL HIGH (ref 70–99)
Glucose-Capillary: 166 mg/dL — ABNORMAL HIGH (ref 70–99)
Glucose-Capillary: 171 mg/dL — ABNORMAL HIGH (ref 70–99)
Glucose-Capillary: 174 mg/dL — ABNORMAL HIGH (ref 70–99)
Glucose-Capillary: 229 mg/dL — ABNORMAL HIGH (ref 70–99)

## 2021-03-13 LAB — MAGNESIUM: Magnesium: 2.5 mg/dL — ABNORMAL HIGH (ref 1.7–2.4)

## 2021-03-13 MED ORDER — HYDRALAZINE HCL 25 MG PO TABS
25.0000 mg | ORAL_TABLET | Freq: Three times a day (TID) | ORAL | Status: DC
  Administered 2021-03-13 – 2021-03-15 (×5): 25 mg
  Filled 2021-03-13 (×5): qty 1

## 2021-03-13 MED ORDER — VITAMIN B-6 100 MG PO TABS
100.0000 mg | ORAL_TABLET | Freq: Every day | ORAL | Status: DC
  Filled 2021-03-13: qty 1

## 2021-03-13 MED ORDER — VITAMIN D (ERGOCALCIFEROL) 1.25 MG (50000 UNIT) PO CAPS
50000.0000 [IU] | ORAL_CAPSULE | ORAL | Status: DC
  Filled 2021-03-13: qty 1

## 2021-03-13 MED ORDER — LACTATED RINGERS IV BOLUS
1000.0000 mL | Freq: Once | INTRAVENOUS | Status: AC
  Administered 2021-03-13: 1000 mL via INTRAVENOUS

## 2021-03-13 MED ORDER — LEVETIRACETAM 100 MG/ML PO SOLN
1000.0000 mg | Freq: Two times a day (BID) | ORAL | Status: DC
  Administered 2021-03-13 – 2021-03-17 (×8): 1000 mg
  Filled 2021-03-13 (×8): qty 10

## 2021-03-13 MED ORDER — ACETAMINOPHEN 160 MG/5ML PO SOLN: 650.0000 mg | Freq: Four times a day (QID) | ORAL | Status: DC | PRN

## 2021-03-13 MED ORDER — VITAMIN B-6 100 MG PO TABS
100.0000 mg | ORAL_TABLET | Freq: Every day | ORAL | Status: DC
Start: 2021-03-13 — End: 2021-03-17
  Administered 2021-03-13 – 2021-03-17 (×5): 100 mg
  Filled 2021-03-13 (×5): qty 1

## 2021-03-13 MED ORDER — CARVEDILOL 12.5 MG PO TABS
12.5000 mg | ORAL_TABLET | Freq: Two times a day (BID) | ORAL | Status: DC
  Administered 2021-03-14 – 2021-03-17 (×7): 12.5 mg
  Filled 2021-03-13 (×7): qty 1

## 2021-03-13 MED ORDER — AMLODIPINE BESYLATE 10 MG PO TABS
10.0000 mg | ORAL_TABLET | Freq: Every day | ORAL | Status: DC
  Administered 2021-03-13: 10 mg
  Filled 2021-03-13: qty 1

## 2021-03-13 MED ORDER — LACOSAMIDE 200 MG PO TABS
200.0000 mg | ORAL_TABLET | Freq: Two times a day (BID) | ORAL | Status: DC
  Administered 2021-03-13 – 2021-03-17 (×8): 200 mg
  Filled 2021-03-13 (×8): qty 1

## 2021-03-13 MED ORDER — SODIUM CHLORIDE 0.9 % IV SOLN
12.5000 mg | Freq: Four times a day (QID) | INTRAVENOUS | Status: DC | PRN
  Administered 2021-03-13 – 2021-03-17 (×2): 12.5 mg via INTRAVENOUS
  Filled 2021-03-13 (×4): qty 0.5

## 2021-03-13 MED ORDER — LACTATED RINGERS IV BOLUS: 1000.0000 mL | Freq: Once | INTRAVENOUS | Status: DC

## 2021-03-13 MED ORDER — VITAMIN D (ERGOCALCIFEROL) 1.25 MG (50000 UNIT) PO CAPS
50000.0000 [IU] | ORAL_CAPSULE | ORAL | Status: DC
  Administered 2021-03-13: 50000 [IU]
  Filled 2021-03-13: qty 1

## 2021-03-13 NOTE — Progress Notes (Signed)
Brief PCCM Note  Paged for hypotension, lethargy. Still on a lot of BP meds that were added when she may have had component of baclofen withdrawal contributing to HTN. Mental status seems not substantially changed from last 1-2 days.  - decreased to coreg 12.5 BID - stopped amlodipine - decreased to hydralazine 25 q8 - check abg  - 1L LR bolus  [place]

## 2021-03-13 NOTE — Progress Notes (Signed)
PT Cancellation Note  Patient Details Name: [name] MRN: [id number] DOB: [birth year]   Cancelled Treatment:    Reason Eval/Treat Not Completed: Fatigue/lethargy limiting ability to participate Noted pt seen by OT earlier today and rather lethargic.  Will attempt again another day.   Reginia Naas 03/13/2021, 3:29 PM Magda Kiel, PT Acute Rehabilitation Services Pager:[phone] [phone] 03/13/2021

## 2021-03-13 NOTE — Progress Notes (Signed)
Notified MD of hypotension and lethargy.  Pt has been lethargic according to ICU RN.  She responds to voice, and is alert and oriented.  Denies pain.
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
## 2021-03-13 NOTE — Progress Notes (Signed)
   Inpatient Rehab Admissions Coordinator :  Per therapy recommendations patient was screened for CIR candidacy by Toniya Rozar RN MSN. Patient is not yet at a level to tolerate the intensity required to pursue a CIR admit . Patient may have the potential to progress to become a candidate. The CIR admissions team will follow and monitor for progress and place a Rehab Consult order if felt to be appropriate. Please contact me with any questions.  Yahye Siebert RN MSN Admissions Coordinator [phone]  

## 2021-03-13 NOTE — Progress Notes (Addendum)
Following administration of phenergan and daily meds, patient's son alerted this RN to his inability to understand patient. Upon my assessment, patient continues to be A&Ox4 but is now slurring words. Pulled CCM APP into room and he assessed the same. This is thought to be due to medications. Conservative use of anti-emetics recommended.  Will continue to monitor.

## 2021-03-13 NOTE — Progress Notes (Signed)
Nutrition Follow-up  DOCUMENTATION CODES:   Non-severe (moderate) malnutrition in context of chronic illness  INTERVENTION:   Recommend continue tube feeding via Cortrak tube until pt demonstrates tolerance to PO diet and can meet > 60% of her nutrition needs  Osmolite 1.5 @ 65 ml/hr via Cortrak tube  Provides: 1872 kcal, 87 grams protein, and 1264 ml free water  MVI with minerals BID  Vitamin D 50,000 IU weekly x 8 weeks Vitamin B6 100 mg daily x 2 weeks  Encourage PO intake at meals, monitor intake and adjust TF appropriately   NUTRITION DIAGNOSIS:   Moderate Malnutrition related to chronic illness (gastric bypass) as evidenced by mild fat depletion, moderate fat depletion, mild muscle depletion, moderate muscle depletion. Ongoing.   GOAL:   Patient will meet greater than or equal to 90% of their needs Progressing with TF advancement and diet advancement   MONITOR:   Diet advancement, TF tolerance, Skin, Labs, Weight trends  REASON FOR ASSESSMENT:   Consult Enteral/tube feeding initiation and management  ASSESSMENT:   51 yo female admitted with AMS post prolonged seizure. PMH includes gastric bypass, DM, CAD, CKD, GIB, CVA with left sided weakness, subclinical status epilepticus  Pt discussed during ICU rounds and with RN.  Pt lethargic during visit. Pt states she eats 2 meals per day but unable to describe specifics of what she eats.  Per RN pt complains of nausea which is chronic since her surgery and she takes phenergan for this at home.    2/5 s/p cortrak placement tip in jejunum  2/9 s/p swallow eval; pt started on Dysphagia 3 diet with thin liquids, SLP recommends family bring in dentures  Medications reviewed and include: SSI, synthroid, MVI with minerals x 2 IV phenergan every 6 hours  Labs reviewed: Phos: 4.1 -> 5.3 -> 4.2 -> 4.3 Copper: 126 CRP: 0.7 Vitamin B1: 141 Vitamin D: 19 - LOW  Vitamin B12: 356 Vitamin B6: 2.6 - LOW  CBG's:  99-174  Diet Order:   Diet Order             DIET DYS 3 Room service appropriate? Yes; Fluid consistency: Thin  Diet effective now                   EDUCATION NEEDS:   Not appropriate for education at this time  Skin:  Skin Assessment: Reviewed RN Assessment  Last BM:  2/5  Height:   Ht Readings from Last 1 Encounters:  03/10/21 5\' 3"  (1.6 m)    Weight:   Wt Readings from Last 1 Encounters:  03/11/21 73.7 kg    BMI:  Body mass index is 28.78 kg/m.  Estimated Nutritional Needs:   Kcal:  1750-1950 kcals  Protein:  85-100 g  Fluid:  >/= 1.8 L  Manley Fason P., RD, LDN, CNSC See AMiON for contact information

## 2021-03-13 NOTE — Evaluation (Signed)
Occupational Therapy Evaluation Patient Details Name: [name] MRN: [id number] DOB: Jun 17, 1970 Today's Date: 03/13/2021   History of Present Illness 51 y.o. female, who presented to the [facility] ED as a transfer from [facility] ED with a chief complaint of AMS. She has a pertinent past medical history of subclinical status epilepticus, CAD, DM2, CKD, GIB, CVA with left sided weakness, hypothyroidism.   Clinical Impression   Per chart review, pt was previously modified independent with mobility and min A with ADL tasks. Pt recently admitted 02/2021 and was at [facility] level for mobility and min A level with ADL tasks and discharged home with HHOT/PT. Pt lethargic however able to participate in conversation. Eyes closed throughout majority of session with pt inconsistently following commands. Pt was able to progress to EOB with Max A with significant posterior push, however was unable to transfer due to poor postural control, confusion, difficulty using BUE functionally, decreased safety awareness and impulsivity. Pt demonstrates a significant functional decline as noted below. At this time recommend AIR for intensive rehab to facilitate safe DC home. Acute OT to follow.      Recommendations for follow up therapy are one component of a multi-disciplinary discharge planning process, led by the attending physician.  Recommendations may be updated based on patient status, additional functional criteria and insurance authorization.   Follow Up Recommendations  Acute inpatient rehab (3hours/day)    Assistance Recommended at Discharge Frequent or constant Supervision/Assistance  Patient can return home with the following      Functional Status Assessment  Patient has had a recent decline in their functional status and demonstrates the ability to make significant improvements in function in a reasonable and predictable amount of time.  Equipment Recommendations  None recommended by OT     Recommendations for Other Services Rehab consult     Precautions / Restrictions Precautions Precautions: Fall Precaution Comments: seizures      Mobility Bed Mobility Overal bed mobility: Needs Assistance Bed Mobility: Rolling, Sidelying to Sit, Sit to Sidelying Rolling: Mod assist Sidelying to sit: Max assist     Sit to sidelying: Max assist      Transfers                   General transfer comment: did not attempt due to safety concerns with abnormal postural control and impulsivity of pt      Balance Overall balance assessment: Needs assistance   Sitting balance-Leahy Scale: Zero                                     ADL either performed or assessed with clinical judgement   ADL Overall ADL's : Needs assistance/impaired                                       General ADL Comments: total A at this time     Vision   Vision Assessment?: Vision impaired- to be further tested in functional context Additional Comments: eyes closed 95% of session with pt saying she "couldn't open her eyes", she would open them, then close them again     Perception Perception Comments: will further assess   Praxis      Pertinent Vitals/Pain       Hand Dominance Right   Extremity/Trunk Assessment Upper Extremity Assessment Upper Extremity  Assessment: RUE deficits/detail;LUE deficits/detail RUE Deficits / Details: moving spontaneously however not ableto maintain grasp on cloth to wash face; unsure if this is her hemiplegic side; Previous note sasy L hemiplegic; pt staes her stroke affected her R side; abnormal tone present; will further assess RUE Coordination: decreased fine motor;decreased gross motor LUE Deficits / Details: not using funcitonally; abnormal tone; unsure if hemiplegic side; not suing functionally; hand folding underneath her with attempts at bed mobility LUE Coordination: decreased fine motor;decreased gross motor    Lower Extremity Assessment Lower Extremity Assessment: Defer to PT evaluation (holding BLE in extension; ableto voluntarily flex B knees, R greater than L; holfing feet in pplantarflexion; unable to achieve neutral dorsiflexion position R ankle due to tightness)   Cervical / Trunk Assessment Cervical / Trunk Assessment: Other exceptions;Kyphotic (forward head; initial posterior push)   Communication Communication Communication: Expressive difficulties (dysarthric)   Cognition Arousal/Alertness: Lethargic Behavior During Therapy: Restless, Impulsive Overall Cognitive Status: No family/caregiver present to determine baseline cognitive functioning Area of Impairment: Orientation, Attention, Following commands, Safety/judgement, Awareness, Problem solving                 Orientation Level: Disoriented to, Time ("2011") Current Attention Level: Focused   Following Commands: Follows one step commands inconsistently Safety/Judgement: Decreased awareness of safety, Decreased awareness of deficits Awareness: Intellectual Problem Solving: Slow processing, Decreased initiation, Difficulty sequencing, Requires tactile cues, Requires verbal cues General Comments: eyes closed majority of session     General Comments       Exercises     Shoulder Instructions      Home Living Family/patient expects to be discharged to:: Private residence Living Arrangements: Children Available Help at Discharge: Available PRN/intermittently;Family Type of Home: House Home Access: Ramped entrance     Home Layout: One level     Bathroom Shower/Tub: Teacher, early years/pre: Handicapped height Bathroom Accessibility: Yes   Home Equipment: Rollator (4 wheels);Cane - single point;Shower seat          Prior Functioning/Environment Prior Level of Function : Patient poor historian/Family not available             Mobility Comments: uses a rollator, active wtih HH PT ADLs  Comments: uses a shower chiar. Son assists with LB dressing as needed        OT Problem List: Decreased strength;Decreased range of motion;Decreased activity tolerance;Impaired balance (sitting and/or standing);Impaired vision/perception;Decreased coordination;Decreased cognition;Decreased safety awareness;Decreased knowledge of use of DME or AE;Impaired tone;Impaired sensation;Impaired UE functional use      OT Treatment/Interventions: Self-care/ADL training;Therapeutic exercise;Neuromuscular education;DME and/or AE instruction;Therapeutic activities;Cognitive remediation/compensation;Visual/perceptual remediation/compensation;Patient/family education;Balance training    OT Goals(Current goals can be found in the care plan section) Acute Rehab OT Goals Patient Stated Goal: pt states she "wants to get better" OT Goal Formulation: Patient unable to participate in goal setting Time For Goal Achievement: 03/27/21 Potential to Achieve Goals: Good  OT Frequency: Min 2X/week    Co-evaluation              AM-PAC OT "6 Clicks" Daily Activity     Outcome Measure Help from another person eating meals?: Total Help from another person taking care of personal grooming?: Total Help from another person toileting, which includes using toliet, bedpan, or urinal?: Total Help from another person bathing (including washing, rinsing, drying)?: Total Help from another person to put on and taking off regular upper body clothing?: Total Help from another person to put on and taking off regular lower body  clothing?: Total 6 Click Score: 6   End of Session Nurse Communication: Mobility status (significant change from baseline)  Activity Tolerance: Patient limited by lethargy;Other (comment) (pt's impulsivity) Patient left: in bed;with call bell/phone within reach;with bed alarm set;with nursing/sitter in room  OT Visit Diagnosis: Unsteadiness on feet (R26.81);Other abnormalities of gait and mobility  (R26.89);Muscle weakness (generalized) (M62.81);Other symptoms and signs involving the nervous system (R29.898);Other symptoms and signs involving cognitive function                Time: 8757-9728 OT Time Calculation (min): 31 min Charges:  OT General Charges $OT Visit: 1 Visit OT Evaluation $OT Eval High Complexity: 1 High OT Treatments $Self Care/Home Management : 8-22 mins  Maurie Boettcher, OT/L   Acute OT Clinical Specialist Acute Rehabilitation Services Pager [phone] Office [phone]   [facility] 03/13/2021, 2:30 PM

## 2021-03-13 NOTE — Progress Notes (Signed)
NAME:  [name], MRN:  [id number], DOB:  08-08-1970, LOS: 5 ADMISSION DATE:  03/08/2021, CONSULTATION DATE: 2/4 REFERRING MD: Karle Starch, CHIEF COMPLAINT:  AMS   History of Present Illness:  Patient is encephalopathic. Therefore history has been obtained from chart review.   [name], is a 51 y.o. female, who presented to the [facility] ED as a transfer from [facility] ED with a chief complaint of AMS who needed continuous EEG.   They have a pertinent past medical history of subclinical status epilepticus, CAD, DM2, CKD, GIB, CVA with left sided weakness, hypothyroidism.   On the AM of 2/4 she was reportedly in her usual state of health, however she became increasingly confused and altered as the day progressed. Per family she had a similar presentation in Jan 2023 and was noted to be in status epilepticus.   She was given 1000mg  of IV keppra at [facility] [place] and the loaded again with 1000mg  of keppra, 2mg  ativan, and 200mg  IV vimpat at the [facility] ED. ED course was notable for a positive UA which the patient was started on rocephin. Head CT negative for acute process. She was noted to be having apneic spells in the ED. She has been seen by neurology at [facility] and [facility]. She was started on continuous EEG.  PCCM was consulted for admission.   Pertinent  Medical History  subclinical status epilepticus, CAD, DM2, CKD, GIB, CVA with lef sided weakness.   Significant Hospital Events: Including procedures, antibiotic start and stop dates in addition to other pertinent events   2/4 presented with altered mental status, CTH neg, UA +, EEG> 2/6 EEG >> This was a markedly abnormal continuous video EEG due to diffuse slowing with continuous high voltage spiky GPDs, indicative of a widespread cerebral disturbance with epileptogenic potential. There was a transient improvement in morphology and frequency in the afternoon as medications were increased, however overnight there were some brief runs of increased  4hz  runs of spikes concerning for subclinical seizures.  2/7 started on bactrim for UA; mental status slowly improving (wiggles toes and sticks tongue out to command; responds to name) 2/8 SBP elevated to 200s; started on cleviprex in am and weaned off by afternoon.  c EEG turned off  Interim History / Subjective:  SBP improved; one charted SBP 196 overnight; SBP 121-196  Mental status much improved today.  AO X3.  PERRL.  M AE  Objective   Blood pressure (!) 152/83, pulse 93, temperature 98.5 F (36.9 C), temperature source Axillary, resp. rate 14, height 5\' 3"  (1.6 m), weight 73.7 kg, SpO2 100 %.        Intake/Output Summary (Last 24 hours) at 03/13/2021 0708 Last data filed at 03/13/2021 0600 Gross per 24 hour  Intake 1210.57 ml  Output 1450 ml  Net -239.43 ml      Examination: General: NAD HEENT: MM pink/moist; cor track in place Neuro: Mental status much improved today.  AO X3.  PERRL.  M AE  CV: s1s2, RRR, no m/r/g PULM:  dim clear BS bilaterally; on room air GI: soft, bsx4 active  Extremities: warm/dry, no edema  Skin: no rashes or lesions appreciated   Resolved Hospital Problem list     Assessment & Plan:  Altered mental status, ?post ictal; improving Prolonged seizure HX status epilepticus, epilepsy HX CVA CT head negative for acute intracranial abnormality. Covid/Flu neg. VBG 7.33/48/49/25. CXR: no evidence of pneumonia, effusion, ptx. UA positive for nitrite, many bacteria. ?subclinical  seizures. P: -Neurology following; appreciate recs -continue AED: keppra and vimpat -cEEG stopped 2/8 -consider MRI per neuro -avoid sedating meds -seizure precautions in place -prn tylenol for normothermia -cor trak in place; SLP -PT/OT  Apnea:  Episodes of apnea on monitor; no desaturations and ABG showing resp alkalosis P: -continue telemetry monitoring  Hypomag on 2/5: improved P: -trend mag -replete for goal >2  UTI HX of E faecium UTI UA positive for  nitrite, many bacteria UC showing enterobacter cloacae; 2/7 zosyn switched to bactrim P: -continue bactrim x 10 days  DM2 P: -SSI and CBG monitoring  HX CAD Hx of HTN Troponin HS 13 P: -continue ASA, plavix, rosuvastatin  -SBP range 121-196; increase amlodipine 10 mg -continue carvedilol, ISMO, and hydralazine -prn hydralazine and labetalol for HTN  AKI on CKDIIIb: slowly improving P: -Trend BMP / urinary output -Replace electrolytes as indicated -Avoid nephrotoxic agents, ensure adequate renal perfusion  Hypothyroisidm P: -cont synthroid   PCCM will transfer out of ICU today. PCCM will sign off and available prn   Best Practice (right click and "Reselect all SmartList Selections" daily)   Diet/type: tubefeeds and NPO w/ meds via tube DVT prophylaxis: prophylactic heparin  GI prophylaxis: PPI Lines: N/A Foley:  N/A Code Status:  full code Last date of multidisciplinary goals of care discussion [2/9 spoke w/ [name] (son) over phone and updated]   Critical care time: n/a    Ethelene [facility] 03/13/2021, 7:08 AM  Please see [email or web address] for pager details.  From 7A-7P if no response, please call [phone]. After hours, please call ELink [phone].

## 2021-03-13 NOTE — Progress Notes (Signed)
Subjective: Continues to improve  Exam: Vitals:   03/13/21 1200 03/13/21 1300  BP:    Pulse:    Resp: 20 15  Temp: (!) 97.2 F (36.2 C)   SpO2:     Gen: In bed, NAD Resp: non-labored breathing, no acute distress Abd: soft, nt  Neuro: MS: Awake, oriented, able to name objects CN: Pupils equal round and reactive, extraocular movements intact Motor: She moves all extremities well Sensory: Intact light touch  Pertinent Labs: Creatinine 1.83 (GFR 33)  Impression: 51 year old female presenting with encephalopathy in the setting of recent presentation with focal frontal status epilepticus.  From reading the description of her presentation, it seems similar to her presentation for status epilepticus, but the EEG pattern is not definitive.  Previously, she had a clear clinical and electrographic response to Ativan, and though she had a "gray zone" EEG, the lack of clinical or electrographic response to Ativan could be suggestive that this is something else.  She does have an elevated creatinine, and I would not favor continuing the doses of either Vimpat or Keppra that she is currently taking, as I would be concerned that they could contribute to persistent encephalopathy.  Given that she is still confused, I do think an MRI could be helpful.  Recommendations: 1) decrease Keppra to 1 g twice daily 2) decrease Vimpat to 200 twice daily 3) MRI brain 4) neurology will continue to follow  [name], MD [facility] [phone]  If 7pm- 7am, please page neurology on call as listed in [place].

## 2021-03-14 ENCOUNTER — Inpatient Hospital Stay (HOSPITAL_COMMUNITY): Payer: Medicare Other

## 2021-03-14 LAB — GLUCOSE, CAPILLARY
Glucose-Capillary: 143 mg/dL — ABNORMAL HIGH (ref 70–99)
Glucose-Capillary: 167 mg/dL — ABNORMAL HIGH (ref 70–99)
Glucose-Capillary: 131 mg/dL — ABNORMAL HIGH (ref 70–99)
Glucose-Capillary: 123 mg/dL — ABNORMAL HIGH (ref 70–99)
Glucose-Capillary: 129 mg/dL — ABNORMAL HIGH (ref 70–99)

## 2021-03-14 LAB — BASIC METABOLIC PANEL WITH GFR
Anion gap: 8 (ref 5–15)
BUN: 33 mg/dL — ABNORMAL HIGH (ref 6–20)
CO2: 24 mmol/L (ref 22–32)
Calcium: 8.1 mg/dL — ABNORMAL LOW (ref 8.9–10.3)
Chloride: 100 mmol/L (ref 98–111)
Creatinine, Ser: 1.94 mg/dL — ABNORMAL HIGH (ref 0.44–1.00)
GFR, Estimated: 31 mL/min — ABNORMAL LOW
Glucose, Bld: 123 mg/dL — ABNORMAL HIGH (ref 70–99)
Potassium: 4.5 mmol/L (ref 3.5–5.1)
Sodium: 132 mmol/L — ABNORMAL LOW (ref 135–145)

## 2021-03-14 LAB — CBC
HCT: 31.7 % — ABNORMAL LOW (ref 36.0–46.0)
Hemoglobin: 10.7 g/dL — ABNORMAL LOW (ref 12.0–15.0)
MCH: 28.1 pg (ref 26.0–34.0)
MCHC: 33.8 g/dL (ref 30.0–36.0)
MCV: 83.2 fL (ref 80.0–100.0)
Platelets: 142 10*3/uL — ABNORMAL LOW (ref 150–400)
RBC: 3.81 MIL/uL — ABNORMAL LOW (ref 3.87–5.11)
RDW: 14.9 % (ref 11.5–15.5)
WBC: 9.6 10*3/uL (ref 4.0–10.5)
nRBC: 0 % (ref 0.0–0.2)

## 2021-03-14 LAB — MAGNESIUM: Magnesium: 2.5 mg/dL — ABNORMAL HIGH (ref 1.7–2.4)

## 2021-03-14 MED ORDER — SULFAMETHOXAZOLE-TRIMETHOPRIM 800-160 MG PO TABS
1.0000 | ORAL_TABLET | Freq: Two times a day (BID) | ORAL | Status: AC
  Administered 2021-03-14 – 2021-03-15 (×3): 1 via ORAL
  Filled 2021-03-14 (×3): qty 1

## 2021-03-14 MED ORDER — ISOSORBIDE MONONITRATE ER 60 MG PO TB24
60.0000 mg | ORAL_TABLET | Freq: Every day | ORAL | Status: DC
  Administered 2021-03-14 – 2021-03-15 (×2): 60 mg via ORAL
  Filled 2021-03-14 (×2): qty 1

## 2021-03-14 MED ORDER — LORAZEPAM 0.5 MG PO TABS
0.5000 mg | ORAL_TABLET | Freq: Two times a day (BID) | ORAL | Status: DC | PRN
  Administered 2021-03-17: 0.5 mg via ORAL
  Filled 2021-03-14: qty 1

## 2021-03-14 NOTE — Progress Notes (Signed)
Informed by speech therapy that patient stated that she wanted to kill herself. Patient stated to this nurse that she wanted to go home so she could take all her medication  so she could fall asleep and die. Patient stated that she was tried of being in chronic pain and did not want  her son to see her like this. Notified MD. Placed sitter in room and did room search. Asked patient if she was in pain, patient stated that she did not her she wanted to go to sleep.

## 2021-03-14 NOTE — Progress Notes (Signed)
Subjective: Similar to yesterday  Exam: Vitals:   03/14/21 0311 03/14/21 0720  BP: (!) 148/61 (!) 177/83  Pulse: 64 66  Resp: 16 16  Temp: 97.8 F (36.6 C) 98 F (36.7 C)  SpO2: 97% 97%   Gen: In bed, NAD Resp: non-labored breathing, no acute distress Abd: soft, nt  Neuro: MS: Awake, oriented, she is unable to spell world backwards, # of quarters in $2.75 is "$4.75" CN: Pupils equal round and reactive, extraocular movements intact Motor: She moves all extremities well Sensory: Intact light touch  Pertinent Labs: Creatinine 1.94 (GFR 31)  Impression: 51 year old female presenting with encephalopathy in the setting of recent presentation with focal frontal status epilepticus.  From reading the description of her presentation, it seems similar to her presentation for status epilepticus, but the EEG pattern is not definitive.  Previously, she had a clear clinical and electrographic response to Ativan, and though she had a "gray zone" EEG, the lack of clinical or electrographic response to Ativan could be suggestive that this is something else.  She does have an elevated creatinine, and therefore I reduced her keppra and vimpat dose on 2/09.   Given that she is still confused, I do think an MRI could be helpful.  Recommendations: 1)continue Keppra to 1 g twice daily 2) continue Vimpat to 200 twice daily 3) MRI brain 4) neurology will continue to follow  [name], MD [facility] [phone]  If 7pm- 7am, please page neurology on call as listed in [place].

## 2021-03-14 NOTE — Progress Notes (Signed)
PROGRESS NOTE  [name]  WJX:[id number] DOB: 10-06-1970 DOA: 03/08/2021 PCP: [name], MD   Brief Narrative:  Patient is a 51 year old female with past medical history of status epilepticus, coronary artery disease, diabetes type 2, CKD, GI bleed, CVA with left-sided weakness, hypothyroidism who presented to [facility] initially with complaints of altered mental status.  On presentation, he was also suspicious for UTI and was started on antibiotics.  CT head did not show any acute intracranial abnormalities.  She was thought to have status epilepticus and was transferred to [facility] for continuous EEG.  PCCM was consulted after admission.  Neurology, PCCM following here.  Transferred to [facility] service on 2/10.  Mental status has significantly improved and she is currently alert and oriented.  PT/OT recommending acute inpatient rehab.   Assessment & Plan:  Active Problems:   Altered mental status   Malnutrition of moderate degree   Urinary tract infection without hematuria   Hypertension associated with diabetes ([place])   Hypertensive emergency    Altered mental status: Metabolic encephalopathy secondary to suspected   non convulsive status epilepticus.   CT head imaging was negative for intracranial abnormalities.  MRI of the brain has been ordered by neurology.  Her mental status has significantly improved and she was alert and oriented x3  Status epilepticus: Transferred to [facility] for continuous EEG.  Neurology following.  Video EEG showed diffuse slowing with continuous high-voltage spiky discharges indicative of widespread cerebral disturbance with epileptogenic potential.  Concern for subclinical seizures.  Currently on Vimpat, Keppra.  Continuous EEG stopped on 2/8. Continue seizure precaution  Dysphagia: Secondary to altered mental status.  Initially started on tube feeding.  SLP following.  Currently on dysphagia 3 diet, tube feeding will be discontinued  UTI: UA  was suspicious for urinary tract infection.  Urine cultures showed Enterobacter cloacae, currently on Bactrim.  Denies any dysuria  Diabetes type 2: Monitor blood sugars.  Continue sliding scale insulin.  Hypertension: Remains hypertensive.  On  carvedilol, isosorbide, hydralazine.  Monitor blood pressure.  Continue as needed medications for severe hypertension  AKI on CKD stage IIIb: Baseline creatinine fluctuates from 1.3-1.8.  She follows with Dr. Zollie [facility]  Hypothyroidism: On Synthyroid  History of coronary artery disease: No anginal symptoms.  On aspirin, Plavix, statin at home which we will continue  Hyponatremia: Mild.  Continue to monitor  Normocytic anemia: Currently hemoglobin stable in the range of 10.  Has mild thrombocytopenia, continue monitoring CBC  Debility/deconditioning: PT/OT recommending CIR on  discharge.  Son is interested to bring her home with therapy          Nutrition Problem: Moderate Malnutrition Etiology: chronic illness (gastric bypass)    DVT prophylaxis:heparin injection 5,000 Units Start: 03/08/21 2300 SCDs Start: 03/08/21 2245     Code Status: Full Code  Family Communication:: Discussed with son on phone on 2/10  Patient status:Inpatient   Anticipated discharge to:CIR   Consultants: Neurology, PCCM  Procedures: Video EEG  Antimicrobials:  Anti-infectives (From admission, onward)    Start     Dose/Rate Route Frequency Ordered Stop   03/11/21 1000  sulfamethoxazole-trimethoprim (BACTRIM DS) 800-160 MG per tablet 1 tablet        1 tablet Per Tube Every 12 hours 03/11/21 0809 03/21/21 0959   03/10/21 2000  piperacillin-tazobactam (ZOSYN) IVPB 3.375 g  Status:  Discontinued        3.375 g 12.5 mL/hr over 240 Minutes Intravenous Every 8  hours 03/10/21 1225 03/11/21 0809   03/10/21 1130  piperacillin-tazobactam (ZOSYN) IVPB 3.375 g        3.375 g 12.5 mL/hr over 240 Minutes Intravenous  Once 03/10/21 1034  03/10/21 1608   03/08/21 2215  cefTRIAXone (ROCEPHIN) 1 g in sodium chloride 0.9 % 100 mL IVPB        1 g 200 mL/hr over 30 Minutes Intravenous  Once 03/08/21 2208 03/08/21 2344       Subjective: Patient seen and examined at the bedside this morning.  Hemodynamically stable.  Her mental status has significantly improved and she was alert and oriented x3.  She was eating her breakfast.  She answered most of the questions correctly.  Denies any complaints  Objective: Vitals:   03/14/21 0219 03/14/21 0311 03/14/21 0500 03/14/21 0720  BP: (!) 149/77 (!) 148/61  (!) 177/83  Pulse:  64  66  Resp:  16  16  Temp:  97.8 F (36.6 C)  98 F (36.7 C)  TempSrc:  Oral  Oral  SpO2:  97%  97%  Weight:   74.3 kg   Height:        Intake/Output Summary (Last 24 hours) at 03/14/2021 0742 Last data filed at 03/13/2021 1700 Gross per 24 hour  Intake 1564.78 ml  Output 600 ml  Net 964.78 ml   Filed Weights   03/10/21 1218 03/11/21 0500 03/14/21 0500  Weight: 73.7 kg 73.7 kg 74.3 kg    Examination:  General exam: Overall comfortable, not in distress, deconditioned, chronically ill looking HEENT: PERRL Respiratory system:  no wheezes or crackles  Cardiovascular system: S1 & S2 heard, RRR.  Gastrointestinal system: Abdomen is nondistended, soft and nontender. Central nervous system: Alert and oriented Extremities: No edema, no clubbing ,no cyanosis Skin: No rashes, no ulcers,no icterus     Data Reviewed: I have personally reviewed following labs and imaging studies  CBC: Recent Labs  Lab 03/10/21 0946 03/10/21 1122 03/11/21 0451 03/12/21 0441 03/13/21 0738 03/14/21 0118  WBC 11.6*  --  9.7 8.7 9.8 9.6  NEUTROABS 9.3*  --  7.7  --   --   --   HGB 15.4* 14.6 14.8 14.5 11.6* 10.7*  HCT 46.6* 43.0 43.4 42.6 35.6* 31.7*  MCV 82.3  --  80.7 81.8 84.0 83.2  PLT 208  --  193 157 166 332*   Basic Metabolic Panel: Recent Labs  Lab 03/09/21 0228 03/10/21 0946 03/10/21 1122  03/10/21 1744 03/11/21 0451 03/11/21 1555 03/12/21 0441 03/13/21 0738 03/14/21 0118  NA 143 137 136  --  136  --  136 135 132*  K 4.6 4.3 4.5  --  4.2  --  4.1 3.9 4.5  CL 108 99  --   --  100  --  100 101 100  CO2 23 23  --   --  23  --  22 26 24   GLUCOSE 154* 140*  --   --  183*  --  115* 152* 123*  BUN 16 23*  --   --  33*  --  28* 32* 33*  CREATININE 1.38*   1.45* 1.46*  --   --  1.67*  --  1.48* 1.83* 1.94*  CALCIUM 9.2 9.1  --   --  9.2  --  8.9 8.3* 8.1*  MG 1.6* 1.5*  --  2.7* 2.6* 2.5* 2.2 2.5* 2.5*  PHOS 4.1  --   --  5.3* 4.2 4.3  --   --   --  Recent Results (from the past 240 hour(s))  Resp Panel by RT-PCR (Flu A&B, Covid) Nasopharyngeal Swab     Status: None   Collection Time: 03/08/21  2:25 PM   Specimen: Nasopharyngeal Swab; Nasopharyngeal(NP) swabs in vial transport medium  Result Value Ref Range Status   SARS Coronavirus 2 by RT PCR NEGATIVE NEGATIVE Final    Comment: (NOTE) SARS-CoV-2 target nucleic acids are NOT DETECTED.  The SARS-CoV-2 RNA is generally detectable in upper respiratory specimens during the acute phase of infection. The lowest concentration of SARS-CoV-2 viral copies this assay can detect is 138 copies/mL. A negative result does not preclude SARS-Cov-2 infection and should not be used as the sole basis for treatment or other patient management decisions. A negative result may occur with  improper specimen collection/handling, submission of specimen other than nasopharyngeal swab, presence of viral mutation(s) within the areas targeted by this assay, and inadequate number of viral copies(<138 copies/mL). A negative result must be combined with clinical observations, patient history, and epidemiological information. The expected result is Negative.  Fact Sheet for Patients:  EntrepreneurPulse.com.au  Fact Sheet for Healthcare Providers:  IncredibleEmployment.be  This test is no t yet approved or  cleared by the [place] FDA and  has been authorized for detection and/or diagnosis of SARS-CoV-2 by FDA under an Emergency Use Authorization (EUA). This EUA will remain  in effect (meaning this test can be used) for the duration of the COVID-19 declaration under Section 564(b)(1) of the Act, 21 U.S.C.section 360bbb-3(b)(1), unless the authorization is terminated  or revoked sooner.       Influenza A by PCR NEGATIVE NEGATIVE Final   Influenza B by PCR NEGATIVE NEGATIVE Final    Comment: (NOTE) The Xpert Xpress SARS-CoV-2/FLU/RSV plus assay is intended as an aid in the diagnosis of influenza from Nasopharyngeal swab specimens and should not be used as a sole basis for treatment. Nasal washings and aspirates are unacceptable for Xpert Xpress SARS-CoV-2/FLU/RSV testing.  Fact Sheet for Patients: EntrepreneurPulse.com.au  Fact Sheet for Healthcare Providers: IncredibleEmployment.be  This test is not yet approved or cleared by the [place] FDA and has been authorized for detection and/or diagnosis of SARS-CoV-2 by FDA under an Emergency Use Authorization (EUA). This EUA will remain in effect (meaning this test can be used) for the duration of the COVID-19 declaration under Section 564(b)(1) of the Act, 21 U.S.C. section 360bbb-3(b)(1), unless the authorization is terminated or revoked.  Performed at [facility], [address], [place], [place] [address]   Urine Culture     Status: Abnormal   Collection Time: 03/08/21  8:45 PM   Specimen: Urine, Clean Catch  Result Value Ref Range Status   Specimen Description URINE, CLEAN CATCH  Final   Special Requests   Final    ADDED 2311 Performed at [facility], [place] [address] [place], [place] [address]    Culture >=100,000 COLONIES/mL ENTEROBACTER CLOACAE (A)  Final   Report Status 03/11/2021 FINAL  Final   Organism ID, Bacteria ENTEROBACTER CLOACAE (A)  Final       Susceptibility   Enterobacter cloacae - MIC*    CEFAZOLIN >=64 RESISTANT Resistant     CEFEPIME 8 INTERMEDIATE Intermediate     CIPROFLOXACIN 1 RESISTANT Resistant     GENTAMICIN <=1 SENSITIVE Sensitive     IMIPENEM 0.5 SENSITIVE Sensitive     NITROFURANTOIN >=512 RESISTANT Resistant     TRIMETH/SULFA <=20 SENSITIVE Sensitive     PIP/TAZO >=128 RESISTANT Resistant     * >=  100,000 COLONIES/mL ENTEROBACTER CLOACAE  MRSA Next Gen by PCR, Nasal     Status: None   Collection Time: 03/09/21 12:25 AM   Specimen: Nasal Mucosa; Nasal Swab  Result Value Ref Range Status   MRSA by PCR Next Gen NOT DETECTED NOT DETECTED Final    Comment: (NOTE) The GeneXpert MRSA Assay (FDA approved for NASAL specimens only), is one component of a comprehensive MRSA colonization surveillance program. It is not intended to diagnose MRSA infection nor to guide or monitor treatment for MRSA infections. Test performance is not FDA approved in patients less than 3 years old. Performed at [facility], [place] [address], [place], [place] [address]      Radiology Studies: VAS [place] UPPER EXTREMITY VENOUS DUPLEX  Result Date: 03/12/2021 UPPER VENOUS STUDY  Patient Name:  Bigelow  Date of Exam:   03/12/2021 Medical Rec #: [id number]              Accession #:    [id number] Date of Birth: 08-16-1970              Patient Gender: F Patient Age:   33 years Exam Location:  [facility] Procedure:      VAS [place] UPPER EXTREMITY VENOUS DUPLEX Referring Phys: Leslye Peer --------------------------------------------------------------------------------  Indications: Swelling Comparison Study: no prior Performing Technologist: Archie Patten RVS  Examination Guidelines: A complete evaluation includes B-mode imaging, spectral Doppler, color Doppler, and power Doppler as needed of all accessible portions of each vessel. Bilateral testing is considered an integral part of a complete examination. Limited examinations for  reoccurring indications may be performed as noted.  Right Findings: +----------+------------+---------+-----------+----------+-------+  RIGHT      Compressible Phasicity Spontaneous Properties Summary  +----------+------------+---------+-----------+----------+-------+  Subclavian     Full        Yes        Yes                         +----------+------------+---------+-----------+----------+-------+  Left Findings: +----------+------------+---------+-----------+----------+-------+  LEFT       Compressible Phasicity Spontaneous Properties Summary  +----------+------------+---------+-----------+----------+-------+  IJV            Full        Yes        Yes                         +----------+------------+---------+-----------+----------+-------+  Subclavian     Full        Yes        Yes                         +----------+------------+---------+-----------+----------+-------+  Axillary       Full        Yes        Yes                         +----------+------------+---------+-----------+----------+-------+  Brachial       Full        Yes        Yes                         +----------+------------+---------+-----------+----------+-------+  Radial         Full                                               +----------+------------+---------+-----------+----------+-------+  Ulnar          Full                                               +----------+------------+---------+-----------+----------+-------+  Cephalic       Full                                               +----------+------------+---------+-----------+----------+-------+  Basilic        Full                                               +----------+------------+---------+-----------+----------+-------+  Summary:  Right: No evidence of thrombosis in the subclavian.  Left: No evidence of deep vein thrombosis in the upper extremity. No evidence of superficial vein thrombosis in the upper extremity.  *See table(s) above for measurements and observations.   Diagnosing physician: Harold Barban MD Electronically signed by Harold Barban MD on 03/12/2021 at 9:00:41 PM.    Final    [place] EKG SITE RITE  Result Date: 03/12/2021 If Site Rite image not attached, placement could not be confirmed due to current cardiac rhythm.   Scheduled Meds:  aspirin  81 mg Per Tube Daily   baclofen  5 mg Per Tube BID   carvedilol  12.5 mg Per Tube BID WC   chlorhexidine  15 mL Mouth Rinse BID   Chlorhexidine Gluconate Cloth  6 each Topical Daily   clopidogrel  75 mg Per Tube Daily   gabapentin  100 mg Per Tube Q12H   heparin  5,000 Units Subcutaneous Q8H   hydrALAZINE  25 mg Per Tube TID   insulin aspart  0-15 Units Subcutaneous Q4H   lacosamide  200 mg Per Tube BID   levETIRAcetam  1,000 mg Per Tube BID   levothyroxine  25 mcg Per Tube QAC breakfast   mouth rinse  15 mL Mouth Rinse q12n4p   multivitamin with minerals  1 tablet Per Tube BID   vitamin B-6  100 mg Per Tube Daily   rosuvastatin  40 mg Per Tube q1800   sodium chloride flush  10-40 mL Intracatheter Q12H   sulfamethoxazole-trimethoprim  1 tablet Per Tube Q12H   venlafaxine  37.5 mg Per Tube BID WC   Vitamin D (Ergocalciferol)  50,000 Units Per Tube Q7 days   Continuous Infusions:  feeding supplement (OSMOLITE 1.2 CAL) 1,000 mL (03/14/21 0554)   lactated ringers Stopped (03/13/21 2028)   promethazine (PHENERGAN) injection (IM or IVPB) Stopped (03/13/21 1016)     LOS: 6 days   Shelly Coss, MD [facility] P2/11/2021, 7:42 AM

## 2021-03-14 NOTE — Progress Notes (Signed)
Patient rolling around in bed and set of bed alarm. Patient then tried to get out of bed. Informed patient that she could fall out of bed and hurt herself. Patient stated she did not care. Patient Placed back in the middle of the bed, alarm set.

## 2021-03-14 NOTE — Progress Notes (Signed)
Occupational Therapy Treatment Patient Details Name: Tatianya Schroll MRN: [id number] DOB: 1970-02-07 Today's Date: 03/14/2021   History of present illness 51 y.o. female, who presented to the [facility] ED as a transfer from [facility] ED with a chief complaint of AMS. She has a pertinent past medical history of subclinical status epilepticus, CAD, DM2, CKD, GIB, CVA with left sided weakness, hypothyroidism.   OT comments  Pt making incremental progress with OT goals. This session pt presents with increased depression, reporting that she will not get stronger, even with rehab and just wants to die so that her son does not see her like this. Psychiatry in room with therapist when pt reported this. Pt initially providing support in standing, requiring mod A, as pt began to pivot towards chair, she became very rigid, had difficulty following commands, requiring total A +2 for transfer. Pt following approximately 50% of simple commands this session. OT continuing to recommend AIR to maximize independence and reduce caregiver burden.    Recommendations for follow up therapy are one component of a multi-disciplinary discharge planning process, led by the attending physician.  Recommendations may be updated based on patient status, additional functional criteria and insurance authorization.    Follow Up Recommendations  Acute inpatient rehab (3hours/day)    Assistance Recommended at Discharge Frequent or constant Supervision/Assistance  Patient can return home with the following  Two people to help with bathing/dressing/bathroom;Two people to help with walking and/or transfers;Assistance with cooking/housework;Direct supervision/assist for medications management;Direct supervision/assist for financial management;Assist for transportation;Help with stairs or ramp for entrance   Equipment Recommendations  None recommended by OT    Recommendations for Other Services Rehab consult    Precautions /  Restrictions Precautions Precautions: Fall Precaution Comments: seizures Restrictions Weight Bearing Restrictions: No       Mobility Bed Mobility Overal bed mobility: Needs Assistance Bed Mobility: Supine to Sit     Supine to sit: HOB elevated, Mod assist     General bed mobility comments: modA for management of LE off bed and for bringing trunk to upright    Transfers Overall transfer level: Needs assistance Equipment used: 2 person hand held assist Transfers: Sit to/from Stand, Bed to chair/wheelchair/BSC Sit to Stand: Mod assist, Max assist     Step pivot transfers: Total assist, +2 physical assistance     General transfer comment: modA for power up but requires maxAx2 to bring CoG over BoS, able to take small lateral steps towards recliner however unable to move feet with pivot and ends up with legs crossed, increased L lateral push requiring total A for uncrossing LE and despite maximal cuing for sitting pt unable to flex at hips to sit, total A to come to seated in recliner     Balance Overall balance assessment: Needs assistance Sitting-balance support: Feet supported Sitting balance-Leahy Scale: Poor Sitting balance - Comments: requires assist initially able to progress to seated balance with contact guard assist   Standing balance support: Bilateral upper extremity supported Standing balance-Leahy Scale: Zero Standing balance comment: requires maxAx2 for static standing                           ADL either performed or assessed with clinical judgement   ADL Overall ADL's : Needs assistance/impaired                         Toilet Transfer: Maximal assistance;+2 for physical assistance;+2 for safety/equipment;Stand-pivot  Toilet Transfer Details (indicate cue type and reason): PT rigid and not following commands to stand and step transfer to the chair           General ADL Comments: Session limited due to psychiatrist coming in and  cutting session short.    Extremity/Trunk Assessment              Vision       Perception     Praxis      Cognition Arousal/Alertness: Awake/alert Behavior During Therapy: Restless, Impulsive Overall Cognitive Status: No family/caregiver present to determine baseline cognitive functioning Area of Impairment: Orientation, Attention, Following commands, Safety/judgement, Awareness, Problem solving                 Orientation Level: Disoriented to, Time Current Attention Level: Focused   Following Commands: Follows one step commands inconsistently Safety/Judgement: Decreased awareness of safety, Decreased awareness of deficits Awareness: Intellectual Problem Solving: Slow processing, Decreased initiation, Difficulty sequencing, Requires tactile cues, Requires verbal cues General Comments: pt reports wanting to die, because she doesn't want her son to see her this way. Psychiatrist in room during conversation        Exercises      Shoulder Instructions       General Comments VSS on RA    Pertinent Vitals/ Pain       Pain Assessment Pain Assessment: No/denies pain Faces Pain Scale: No hurt  Home Living                                          Prior Functioning/Environment              Frequency  Min 2X/week        Progress Toward Goals  OT Goals(current goals can now be found in the care plan section)  Progress towards OT goals: Progressing toward goals  Acute Rehab OT Goals Patient Stated Goal: none stated OT Goal Formulation: Patient unable to participate in goal setting Time For Goal Achievement: 03/27/21 Potential to Achieve Goals: Good ADL Goals Pt Will Perform Eating: with mod assist Pt Will Perform Grooming: with mod assist;sitting Pt Will Perform Upper Body Bathing: with mod assist;sitting Pt Will Perform Lower Body Bathing: sit to/from stand;with mod assist Additional ADL Goal #1: Maintain midline postural  control EOB with min A in preparation for ADL tasks Additional ADL Goal #2: Consistently follow 1 step commands in minimally distracting environment  Plan Discharge plan remains appropriate;Frequency remains appropriate    Co-evaluation    PT/OT/SLP Co-Evaluation/Treatment: Yes Reason for Co-Treatment: For patient/therapist safety;Necessary to address cognition/behavior during functional activity;Complexity of the patient's impairments (multi-system involvement)   OT goals addressed during session: Strengthening/ROM      AM-PAC OT "6 Clicks" Daily Activity     Outcome Measure   Help from another person eating meals?: Total Help from another person taking care of personal grooming?: Total Help from another person toileting, which includes using toliet, bedpan, or urinal?: Total Help from another person bathing (including washing, rinsing, drying)?: Total Help from another person to put on and taking off regular upper body clothing?: Total Help from another person to put on and taking off regular lower body clothing?: Total 6 Click Score: 6    End of Session Equipment Utilized During Treatment: Gait belt;Rolling walker (2 wheels)  OT Visit Diagnosis: Unsteadiness on feet (R26.81);Other abnormalities of gait and mobility (  R26.89);Muscle weakness (generalized) (M62.81);Other symptoms and signs involving the nervous system (R29.898);Other symptoms and signs involving cognitive function   Activity Tolerance Patient tolerated treatment well   Patient Left in chair;with call bell/phone within reach;with chair alarm set   Nurse Communication Mobility status        Time: 4403-4742 OT Time Calculation (min): 13 min  Charges: OT General Charges $OT Visit: 1 Visit OT Treatments $Therapeutic Activity: 8-22 mins  Ary Lavine H., OTR/L Acute Rehabilitation  Cian Costanzo Elane Bing Plume 03/14/2021, 6:43 PM

## 2021-03-14 NOTE — Progress Notes (Signed)
Patient's mother in room. Patient's mother stated that the patient will come home with her and she could take care of her. Informed mother that the patient could not walk and the mother would need help moving the patient . Mother stated that her son in law would come anytime she called her and he could lift the patient.

## 2021-03-14 NOTE — Consult Note (Shared)
Pt seen doing transfer from bed to chair with OT with extreme difficulty requiring 2 person assist. Makes a comment about her son not needing to see her like this. Expresses fear that she will not get strong in rehab, doesn't think that she is able to do that. Makes a comment about wanting someone to come and take her. States that "he don't need to see it, he don't need to be in it, that is why I am going to die" unprompted while working with OT.   Patient states that "I don't care if I live" when asked how she is doing. Stated reason for wanting to die is that her son doesn't need to see her go through this and he ain't got the time to take care of her. Wants to kill herself so she is not a burden on her son. Patient states that she thinks her son wants to take care of her but he can't. She wants to be gone to make his life easier. Has been feeling like this for "a day". She talked to him about coming home and son told her she needed to stay until she gets better. Was living in [place] with her son (per nurse lives alone) prior to admission to the hospital. She had difficulty taking care of herself and is now unable to walk.    Pt with a hx depression and anxiety, takes anxiety medication. Pt states that her effexor helps with mood - was changed from pristiq to effexor in the hospital. Wellbutrin was stopped fairly recently due to seizures. Patient stated that she did not made a suicidal threat but meant more that she can take whatever pills she wants (pt frustrated by getting different depression medication and not getting narcotics). Denies being in pain, wants narcotics because she is sick to her stomach (or has pain in her stomach?).   Endorses hopelessness, helplessness. Denies HI, Denies AH/VH. Pt states that she is having "a lot" of anxiety.   Was a teenager when diagnosed with depression. Thinks she has been on antidepressants for years.   Occasionally makes bizarre statements such as hearing  voices in the hallway and asking if it is the president of the hospital and wanting to talk to him about bugs in the lights. Patient does want to go to inpatient rehab but then started to talk about being put in with 2 people in a room and there not being enough room for 2 people (this made very little sense). Thinks she has a female roommate who will be back on Sunday. Difficult to redirect from this. At this point pt becomes more sedated and is difficult to interact with.   No hx SI, no hx psych hospitalization.  Tells [place] we need to talk to her son. Thinks he will be worried. States she does not want him to worry.   Does not feel like she will be safe in the hospital and feels she is being treated by a criminal; states they won't let her up.   Knows she is at [facility] because she had a seizure. Knows it is February of 2023. Knows she does not like the current president but difficulty retrieving his name. Guesses Bush. Able to get Biden off of a list. Able to do DOWB starting from Sunday.   No family hx mental illness No hx illicit drug use.    Pt has been sleeping well. She states that she has a problem sleeping when she doesn't get her  pills.    Overall pt is fairly perseverative on not wanting to be a burden. *** put in bit about getting progressively more confused, waxing and waning, etc***   Collateral Spoke to bedside nurse - pt has made suicidal statements to multiple staff members today. Son has said he is not worried about any of this and will come and pick her up; notably pt lives alone and is currently recommended for CIR.   Dx: ongoing encephalopathy Depression Consider inc effexor  Consider s low dose BZD

## 2021-03-14 NOTE — TOC Initial Note (Signed)
Transition of Care [facility]) - Initial/Assessment Note    Patient Details  Name: [name] MRN: [id number] Date of Birth: August 31, 1970  [facility] CM/SW Contact:    Pollie Friar, RN Phone Number: 03/14/2021, 11:17 AM  Clinical Narrative:                 Patient lives at home with her son. She states he is not there 24/7. She has walker, shower seat and 3 in 1. She says her son does her medications in a pill box. She says her has a caregiver 1-2 days a week that also provides some transportation and her son does the rest.  Recommendation are for CIR. Pt is interested. CM has asked PT to see what their recommendations are today. TOC following.  Expected Discharge Plan: IP Rehab Facility Barriers to Discharge: Continued Medical Work up   Patient Goals and CMS Choice   CMS Medicare.gov Compare Post Acute Care list provided to:: Patient Choice offered to / list presented to : Patient, Adult Children  Expected Discharge Plan and Services Expected Discharge Plan: [name]   Discharge Planning Services: CM Consult Post Acute Care Choice: IP Rehab Living arrangements for the past 2 months: Single Family Home                                      Prior Living Arrangements/Services Living arrangements for the past 2 months: Single Family Home Lives with:: Adult Children Patient language and need for interpreter reviewed:: Yes Do you feel safe going back to the place where you live?: Yes      Need for Family Participation in Patient Care: Yes (Comment) Care giver support system in place?: No (comment)   Criminal Activity/Legal Involvement Pertinent to Current Situation/Hospitalization: No - Comment as needed  Activities of Daily Living      Permission Sought/Granted                  Emotional Assessment Appearance:: Appears stated age Attitude/Demeanor/Rapport: Engaged Affect (typically observed): Accepting Orientation: : Oriented  to Self, Oriented to Place   Psych Involvement: No (comment)  Admission diagnosis:  Altered mental status [R41.82] Urinary tract infection without hematuria, site unspecified [N39.0] Altered mental status, unspecified altered mental status type [R41.82] Patient Active Problem List   Diagnosis Date Noted   Hypertensive emergency    Urinary tract infection without hematuria    Hypertension associated with diabetes ([place])    Malnutrition of moderate degree 03/10/2021   Seizure ([place])    Altered mental status 02/08/2021   Seizure disorder ([name]) 02/08/2021   Acute focal neurological deficit 02/07/2021   Chronic kidney disease, stage III (moderate) (HCC)    Neurogenic bladder    Intermittent self-catheterization of bladder    History of ESBL E. coli infection    Memory loss 01/17/2021   Chronic migraine w/o aura w/o status migrainosus, not intractable 01/17/2021   Major depressive disorder, recurrent episode, moderate ([name]) 12/08/2020   Family history of malignant neoplasm of gastrointestinal tract    Near syncope 01/25/2020   Pulmonary embolism ([name]) 11/07/2019   Type II diabetes mellitus with renal manifestations ([name]) 11/05/2019   Family history of breast cancer 05/23/2019   Family history of colon cancer 05/23/2019   Retinopathy 05/12/2019   Abnormal LFTs 04/18/2019   Esophageal dysphagia    Gastrointestinal tract imaging abnormality  Malignant neoplasm of vulva, unspecified ([name]) 03/10/2019   Peripheral vascular disease, unspecified ([name]) 03/10/2019   Paroxysmal supraventricular tachycardia ([name]) 03/10/2019   Autonomic neuropathy 03/24/2018   H/O gastric bypass 03/02/2018   Hypertension associated with stage 3 chronic kidney disease due to type 2 diabetes mellitus ([name]) 02/20/2018   Insomnia 03/18/2017   Ischemic cardiomyopathy    Arthritis    Anxiety    Hx of colonic polyps    CAD (coronary artery disease)    Hypertensive heart disease    Status post bariatric surgery  06/04/2015   Carotid stenosis 04/30/2015   Iron deficiency anemia 03/22/2015   Vitamin B12 deficiency 02/18/2015   Malignant melanoma ([name]) 40/97/3532   Chronic systolic CHF (congestive heart failure) ([place])    Incomplete bladder emptying 07/12/2014   Hypothyroidism 12/30/2013   Aberrant subclavian artery 11/17/2013   Multiple sclerosis ([name]) 11/02/2013   History of CVA with residual deficit 06/20/2013   Headache, migraine 05/29/2013   Hyperlipidemia    GERD (gastroesophageal reflux disease)    Demyelinating disease ([name]) 01/02/2011   Depression with anxiety 05/01/2008   Essential hypertension 05/01/2008   PCP:  Kendra Sons, MD Pharmacy:   [id number] - Closed - [name], [place] - [address] [place] [address] Phone: [phone] Fax: [phone]  [place], [place] [address] [place] [address] Phone: [phone] Fax: [phone]     Social Determinants of Health (SDOH) Interventions    Readmission Risk Interventions Readmission Risk Prevention Plan 11/07/2019 10/18/2019 09/05/2019  Transportation Screening Complete Complete Complete  Medication Review Press photographer) Complete Complete Complete  PCP or Specialist appointment within 3-5 days of discharge (No Data) Complete Complete  HRI or Home Care Consult Complete Complete -  SW Recovery Care/Counseling Consult - Complete Complete  Palliative Care Screening Not Applicable Not Applicable Not [place] Not Applicable Not Applicable Not Applicable  Some recent data might be hidden

## 2021-03-14 NOTE — Evaluation (Addendum)
Physical Therapy Evaluation Patient Details Name: [name] MRN: [id number] DOB: March 20, 1970 Today's Date: 03/14/2021  History of Present Illness  51 y.o. female, who presented to the [facility] ED as a transfer from [facility] ED with a chief complaint of AMS. She has a pertinent past medical history of subclinical status epilepticus, CAD, DM2, CKD, GIB, CVA with left sided weakness, hypothyroidism.  Clinical Impression  NT finishing up feeding pt her pears. Pt poor historian however reports she lives with her son and that she likes to watch TV, she gets the TV during the day and her son watches Netflix at night, unless he is working 24 hour shift as IT sales professional. Pt limited in safe mobility by decreased cognition, decreased AROM, secondary to weakness and tone L LE >R LE and poor balance. Pt requires modA for bed mobility and total A for transfer to chair. Psych team in room for transfer, once in chair pt reports wanting to die because she does not want her son to see her like this. At end of session, pt spits out pear piece she had been pocketing throughout session. PT recommending AIR level rehab at discharge if 24 hour assist can be estabilished. PT will continue to follow acutely.       Recommendations for follow up therapy are one component of a multi-disciplinary discharge planning process, led by the attending physician.  Recommendations may be updated based on patient status, additional functional criteria and insurance authorization.  Follow Up Recommendations Acute inpatient rehab (3hours/day)    Assistance Recommended at Discharge Frequent or constant Supervision/Assistance  Patient can return home with the following  Two people to help with walking and/or transfers;A lot of help with bathing/dressing/bathroom;Assistance with cooking/housework;Assistance with feeding;Direct supervision/assist for medications management;Direct supervision/assist for financial management;Assist for  transportation;Help with stairs or ramp for entrance    Equipment Recommendations None recommended by PT     Functional Status Assessment Patient has had a recent decline in their functional status and demonstrates the ability to make significant improvements in function in a reasonable and predictable amount of time.     Precautions / Restrictions Precautions Precautions: Fall Precaution Comments: seizures Restrictions Weight Bearing Restrictions: No      Mobility  Bed Mobility Overal bed mobility: Needs Assistance Bed Mobility: Supine to Sit     Supine to sit: HOB elevated, Mod assist     General bed mobility comments: modA for management of LE off bed and for bringing trunk to upright    Transfers Overall transfer level: Needs assistance Equipment used: 2 person hand held assist Transfers: Sit to/from Stand, Bed to chair/wheelchair/BSC Sit to Stand: Mod assist, Max assist   Step pivot transfers: Total assist, +2 physical assistance       General transfer comment: modA for power up but requires maxAx2 to bring CoG over BoS, able to take small lateral steps towards recliner however unable to move feet with pivot and ends up with legs crossed, increased L lateral push requiring total A for uncrossing LE and despite maximal cuing for sitting pt unable to flex at hips to sit, total A to come to seated in recliner    Ambulation/Gait               General Gait Details: unable         Balance Overall balance assessment: Needs assistance Sitting-balance support: Feet supported Sitting balance-Leahy Scale: Poor Sitting balance - Comments: requires assist initially able to progress to seated balance with contact  guard assist   Standing balance support: Bilateral upper extremity supported Standing balance-Leahy Scale: Zero Standing balance comment: requires maxAx2 for static standing                             Pertinent Vitals/Pain Pain  Assessment Pain Assessment: No/denies pain Faces Pain Scale: No hurt    Home Living Family/patient expects to be discharged to:: Private residence Living Arrangements: Children Available Help at Discharge: Available PRN/intermittently;Family Type of Home: House Home Access: Ramped entrance       Home Layout: One level Home Equipment: Rollator (4 wheels);Cane - single point;Shower seat Additional Comments: pt poor historian, home set up taken from prior visits    Prior Function Prior Level of Function : Patient poor historian/Family not available                     Hand Dominance   Dominant Hand: Right    Extremity/Trunk Assessment   Upper Extremity Assessment Upper Extremity Assessment: Defer to OT evaluation    Lower Extremity Assessment Lower Extremity Assessment: Difficult to assess due to impaired cognition;RLE deficits/detail;LLE deficits/detail RLE Deficits / Details: AAROM limited increased tone or resistance to ankle movement, and knee movement, strength assessed in mobility 2+/5 RLE Coordination: decreased fine motor;decreased gross motor LLE Deficits / Details: AAROM limited increased tone or resistance to ankle movement, and knee movement, R LE with more movement than L LE, strength assessed in mobility 2+/5 LLE Coordination: decreased fine motor;decreased gross motor    Cervical / Trunk Assessment Cervical / Trunk Assessment: Other exceptions;Kyphotic (forward head; initial posterior push)  Communication   Communication: Expressive difficulties (dysarthric)  Cognition Arousal/Alertness: Awake/alert Behavior During Therapy: Restless, Impulsive Overall Cognitive Status: No family/caregiver present to determine baseline cognitive functioning Area of Impairment: Orientation, Attention, Following commands, Safety/judgement, Awareness, Problem solving                 Orientation Level: Disoriented to, Time Current Attention Level: Focused    Following Commands: Follows one step commands inconsistently Safety/Judgement: Decreased awareness of safety, Decreased awareness of deficits Awareness: Intellectual Problem Solving: Slow processing, Decreased initiation, Difficulty sequencing, Requires tactile cues, Requires verbal cues General Comments: pt reports not wanting to die, because she doesn't want her son to see her this way. Psychiatrist in room during conversation        General Comments General comments (skin integrity, edema, etc.): VSS on RA        Assessment/Plan    PT Assessment Patient needs continued PT services  PT Problem List Decreased strength;Decreased range of motion;Decreased activity tolerance;Decreased balance;Decreased mobility;Decreased coordination;Decreased cognition;Decreased safety awareness;Decreased knowledge of use of DME;Decreased knowledge of precautions;Impaired tone       PT Treatment Interventions DME instruction;Gait training;Functional mobility training;Therapeutic activities;Therapeutic exercise;Balance training;Cognitive remediation;Patient/family education    PT Goals (Current goals can be found in the Care Plan section)  Acute Rehab PT Goals Patient Stated Goal: not let her son see her like this PT Goal Formulation: With patient Time For Goal Achievement: 03/28/21 Potential to Achieve Goals: Fair    Frequency Min 3X/week     Co-evaluation PT/OT/SLP Co-Evaluation/Treatment: Yes Reason for Co-Treatment: For patient/therapist safety;Necessary to address cognition/behavior during functional activity;Complexity of the patient's impairments (multi-system involvement) PT goals addressed during session: Mobility/safety with mobility;Balance         AM-PAC PT "6 Clicks" Mobility  Outcome Measure Help needed turning from your back to your side  while in a flat bed without using bedrails?: A Little Help needed moving from lying on your back to sitting on the side of a flat bed  without using bedrails?: A Lot Help needed moving to and from a bed to a chair (including a wheelchair)?: Total Help needed standing up from a chair using your arms (e.g., wheelchair or bedside chair)?: Total Help needed to walk in hospital room?: Total Help needed climbing 3-5 steps with a railing? : Total 6 Click Score: 9    End of Session Equipment Utilized During Treatment: Gait belt Activity Tolerance: Patient tolerated treatment well Patient left: in chair;with call bell/phone within reach;with chair alarm set;Other (comment) (Psych Team in room) Nurse Communication: Mobility status PT Visit Diagnosis: Unsteadiness on feet (R26.81);Other abnormalities of gait and mobility (R26.89);Muscle weakness (generalized) (M62.81);History of falling (Z91.81);Difficulty in walking, not elsewhere classified (R26.2);Other symptoms and signs involving the nervous system (R29.898);Adult, failure to thrive (R62.7)    Time: 9562-1308 PT Time Calculation (min) (ACUTE ONLY): 13 min   Charges:   PT Evaluation $PT Eval Moderate Complexity: 1 Mod          Giavanni Zeitlin B. Beverely Risen PT, DPT Acute Rehabilitation Services Pager [phone] Office [phone]   Elon Alas Fleet 03/14/2021, 2:57 PM

## 2021-03-14 NOTE — Consult Note (Signed)
[name]   Reason for [name]: Suicidal ideations Referring Physician:  Dr Tawanna Solo Patient Identification: [name] MRN:  [id number] Principal Diagnosis: <principal problem not specified> Diagnosis:  Active Problems:   Altered mental status   Malnutrition of moderate degree   Urinary tract infection without hematuria   Hypertension associated with diabetes [facility])   Hypertensive emergency   Total Time spent with patient: 45 minutes  HPI Assessment [name] is a 51 y.o. female patient past medical history of status epilepticus, CAD, CKD, DM 2, CVA left-sided weakness, hypothyroidism initially presented to [facility] with complaints of altered mental status.  Patient was started on antibiotics for UTI.  She was thought to have status epilepticus and was transferred to [name] for EEG.  Psychiatry consulted for suicidal ideations by Dr. Tawanna Solo. Patient does have ongoing encephalopathy due to status epilepticus and UTI.  On examination today, patient is alert and oriented x4, patient reports active suicidal ideations with a plan on overdosing on her meds.  Denies HI, AVH.  Sales promotion account executive for safety. Pt is fairly perseverative on not wanting to be a burden.  Patient's thought process incoherent sometimes and made bizzare statements which did not make any sense which is likely due to encephalopathy.  She patient current symptoms, of developing new suicidal ideations and feeling depressed and helpless likely due to worsening of depression due to disposition issues.  Patient wants to go to her son's home but her son cannot take care of her.   Patient had been taking Xanax 1 mg twice daily as needed for anxiety at home.  Will recommend continuing 1:1 safety sitter for now and starting Ativan 0.5 mg twice daily as needed for anxiety.  Would consider increasing Effexor if patient continues to be depressed. Psychiatry will see patient tomorrow to decide  appropriate disposition.  Recommendations Safety Patient is high risk for self-harm. 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  Medications -Continue medications per primary. -Ativan 0.5 mg twice daily as needed for anxiety to prevent benzo withdrawal. -Continue Effexor 37.5 mg twice daily with meals.  Disposition  Recommend continuing 1:1 Air cabin crew.  Psychiatry will see patient tomorrow to decide appropriate disposition.  Thank you for this psych [name].  Psych will continue to follow.  Subjective: Pt seen doing transfer from bed to chair with OT with extreme difficulty requiring 2 person assist. Makes a comment about her son not needing to see her like this. Expresses fear that she will not get strong in rehab, doesn't think that she is able to do that. Makes a comment about wanting someone to come and take her. States that "he don't need to see it, he don't need to be in it, that is why I am going to die" unprompted while working with OT.   Patient states that "I don't care if I live" when asked how she is doing. Stated reason for wanting to die is that her son doesn't need to see her go through this and he ain't got the time to take care of her. Wants to kill herself so she is not a burden on her son. Patient states that she thinks her son wants to take care of her but he can't. She wants to be gone to make his life easier. Has been feeling like this for "a day". She talked to him about coming home and son told her she needed to stay until she gets better. Was living in [place] with her  son (per nurse lives alone) prior to admission to the hospital. She had difficulty taking care of herself and is now unable to walk.    Pt with a hx depression and anxiety, takes anxiety medication. Pt states that her effexor helps with mood - was changed from pristiq to effexor in the hospital. Wellbutrin was stopped fairly recently due to seizures. Patient stated that she did not made a suicidal  threat but meant more that she can take whatever pills she wants (pt frustrated by getting different depression medication and not getting narcotics). Denies being in pain, wants narcotics because she is sick to her stomach (or has pain in her stomach?).    Endorses hopelessness, helplessness. Denies HI, Denies AH/VH. Pt states that she is having "a lot" of anxiety.   Was a teenager when diagnosed with depression. Thinks she has been on antidepressants for years.   Occasionally makes bizarre statements such as hearing voices in the hallway and asking if it is the president of the hospital and wanting to talk to him about bugs in the lights. Patient does want to go to inpatient rehab but then started to talk about being put in with 2 people in a room and there not being enough room for 2 people (this made very little sense). Thinks she has a female roommate who will be back on Sunday. Difficult to redirect from this. At this point pt becomes more sedated and is difficult to interact with. Pt has been sleeping well. She states that she has a problem sleeping when she doesn't get her pills. Pt getting progressively more confused, with waxing and waning consciousness.    No hx SI, no hx psych hospitalization.  Tells [place] we need to talk to her son. Thinks he will be worried. States she does not want him to worry.   Does not feel like she will be safe in the hospital and feels she is being treated by a criminal; states they won't let her up.    Knows she is at [facility] because she had a seizure. Knows it is February of 2023. Knows she does not like the current president but difficulty retrieving his name. Guesses Bush. Able to get Biden off of a list. Able to do DOWB starting from Sunday.   No family hx mental illness No hx illicit drug use.     Collateral Spoke to bedside nurse - pt has made suicidal statements to multiple staff members today. Son has said he is not worried about any of this and will come  and pick her up; notably pt lives alone and is currently recommended for CIR.     Past Psychiatric History: Depression on Effexor.  Home meds shows Xanax 1 mg twice daily as needed for anxiety  Risk to Self: Yes Risk to Others: No Prior Inpatient Therapy: No Prior Outpatient Therapy:  No  Past Medical History:  Past Medical History:  Diagnosis Date   Acute colitis 01/27/2017   Acute pyelonephritis    Acute upper GI bleed 01/25/2020   Anemia    iron deficiency anemia   Aortic arch aneurysm    BRCA negative 2014   CAD (coronary artery disease)    a. 08/2003 Cath: LAD 30-40-med Rx; b. 11/2014 PCI: LAD 39m (3.25x23 Xience Alpine DES); c. 06/2015 PCI: D1 (1.02V25 Resolute Integrity DES); d. 06/2017 PCI: Patent mLAD stent, D2 95 (PTCA); e. 09/2017 PCI: D2 99ost (CBA); d. 12/2017 Cath: LM nl, LAD 59m, 80d (small), D1  40ost, D2 95ost, LCX 40p, RCA 40ost/p->Med rx for D2 given restenosis.   Closed nondisplaced intertrochanteric fracture of left femur ([place]) 10/12/2018   Colitis 06/03/2015   Colon polyp    CVA (cerebral vascular accident) ([place])    Left side weakness.    Degenerative tear of glenoid labrum of right shoulder 03/15/2017   Diabetes mellitus without complication ([place])    Family history of breast cancer    BRCA neg 2014   Femur fracture, left ([place]) 10/12/2018   Gastric ulcer 0/17/7939   Helicobacter pylori infection 11/23/2014   History of echocardiogram    a. 03/2017 Echo: EF 60-65%, no rwma; b. 02/2018 Echo: EF 60-65%, no rwma. Nl RV fxn. No cardiac source of emboli (admitted w/ stroke).   Hypertension    Iron deficiency anemia secondary to blood loss (chronic)    Malignant melanoma of skin of scalp (HCC)    MI, acute, non ST segment elevation (HCC)    Neuromuscular disorder (HCC)    S/P drug eluting coronary stent placement 06/04/2015   Sepsis ([place]) 03/18/2017   Sepsis secondary to UTI ([place]) 04/23/2020   Stroke ([place])    a. 02/2018 MRI: 82m late acute/early subacute L medial frontal  lobe inarct; b. 02/2018 MRA No large vessel occlusion or aneurysm. Mod to sev L P2 stenosis. thready L vertebral artery, diffusely dzs'd; c. 02/2018 Carotid U/S: <50% bilat ICA dzs.   Unspecified atrial fibrillation ([place] 04/23/2020    Past Surgical History:  Procedure Laterality Date   APPENDECTOMY     BALLOON ENTEROSCOPY  02/06/2020   [facility]   BIOPSY N/A 03/14/2020   Procedure: BIOPSY;  Surgeon: WLucilla Lame MD;  [place]  Service: Endoscopy;  Laterality: N/A;   CARDIAC CATHETERIZATION N/A 11/09/2014   Procedure: Coronary Angiography;  Surgeon: TMinna Merritts MD;  [place];  Service: Cardiovascular;  Laterality: N/A;   CARDIAC CATHETERIZATION N/A 11/12/2014   Procedure: Coronary Stent Intervention;  Surgeon: AIsaias Cowman MD;  [place];  Service: Cardiovascular;  Laterality: N/A;   CARDIAC CATHETERIZATION N/A 04/18/2015   Procedure: Left Heart Cath and Coronary Angiography;  Surgeon: TMinna Merritts MD;  [place] [facility];  Service: Cardiovascular;  Laterality: N/A;   CARDIAC CATHETERIZATION Left 06/04/2015   Procedure: Left Heart Cath and Coronary Angiography;  Surgeon: MWellington Hampshire MD;  [place];  Service: Cardiovascular;  Laterality: Left;   CARDIAC CATHETERIZATION N/A 06/04/2015   Procedure: Coronary Stent Intervention;  Surgeon: MWellington Hampshire MD;  [place] [place] [place];  Service: Cardiovascular;  Laterality: N/A;   CESAREAN SECTION  2001   CHOLECYSTECTOMY N/A 11/18/2016   Procedure: LAPAROSCOPIC CHOLECYSTECTOMY WITH INTRAOPERATIVE CHOLANGIOGRAM;  Surgeon: SChristene Lye MD;  [facility];  Service: General;  Laterality: N/A;   COLONOSCOPY WITH PROPOFOL N/A 04/27/2016   Procedure: COLONOSCOPY WITH PROPOFOL;  Surgeon: DLucilla Lame MD;  [place]  Service: Endoscopy;  Laterality: N/A;   COLONOSCOPY WITH PROPOFOL N/A 01/12/2018    Procedure: COLONOSCOPY WITH PROPOFOL;  Surgeon: Toledo, TBenay Pike MD;  Location: [facility];  Service: Endoscopy;  Laterality: N/A;   COLONOSCOPY WITH PROPOFOL N/A 03/14/2020   Procedure: COLONOSCOPY WITH PROPOFOL;  Surgeon: WLucilla Lame MD;  [place]  Service: Endoscopy;  Laterality: N/A;  Needs to be scheduled after 7:30 due to driver issues   CORONARY ANGIOPLASTY     CORONARY BALLOON ANGIOPLASTY N/A 06/29/2017  Procedure: CORONARY BALLOON ANGIOPLASTY;  Surgeon: Wellington Hampshire, MD;  Location: [place] CV LAB;  Service: Cardiovascular;  Laterality: N/A;   CORONARY BALLOON ANGIOPLASTY N/A 09/20/2017   Procedure: CORONARY BALLOON ANGIOPLASTY;  Surgeon: Wellington Hampshire, MD;  Location: [place] CV LAB;  Service: Cardiovascular;  Laterality: N/A;   CORONARY STENT INTERVENTION N/A 12/13/2019   Procedure: CORONARY STENT INTERVENTION;  Surgeon: Wellington Hampshire, MD;  Location: [place] CV LAB;  Service: Cardiovascular;  Laterality: N/A;   DILATION AND CURETTAGE OF UTERUS     ESOPHAGOGASTRODUODENOSCOPY (EGD) WITH PROPOFOL N/A 09/14/2014   Procedure: ESOPHAGOGASTRODUODENOSCOPY (EGD) WITH PROPOFOL;  Surgeon: Josefine Class, MD;  Location: [facility] ENDOSCOPY;  Service: Endoscopy;  Laterality: N/A;   ESOPHAGOGASTRODUODENOSCOPY (EGD) WITH PROPOFOL N/A 04/27/2016   Procedure: ESOPHAGOGASTRODUODENOSCOPY (EGD) WITH PROPOFOL;  Surgeon: [name], MD;  Location: [place];  Service: Endoscopy;  Laterality: N/A;  Diabetic - oral meds   ESOPHAGOGASTRODUODENOSCOPY (EGD) WITH PROPOFOL N/A 01/12/2018   Procedure: ESOPHAGOGASTRODUODENOSCOPY (EGD) WITH PROPOFOL;  Surgeon: Toledo, Benay Pike, MD;  Location: [facility];  Service: Endoscopy;  Laterality: N/A;   ESOPHAGOGASTRODUODENOSCOPY (EGD) WITH PROPOFOL N/A 04/11/2019   Procedure: ESOPHAGOGASTRODUODENOSCOPY (EGD) WITH PROPOFOL;  Surgeon: [name], MD;  Location: [facility];  Service: Endoscopy;  Laterality:  N/A;   ESOPHAGOGASTRODUODENOSCOPY (EGD) WITH PROPOFOL N/A 03/08/2020   Procedure: ESOPHAGOGASTRODUODENOSCOPY (EGD) WITH PROPOFOL;  Surgeon: [name], MD;  Location: [facility] ENDOSCOPY;  Service: Endoscopy;  Laterality: N/A;   GASTRIC BYPASS  09/2009   [facility]    INTRAMEDULLARY (IM) NAIL INTERTROCHANTERIC Left 10/13/2018   Procedure: INTRAMEDULLARY (IM) NAIL INTERTROCHANTRIC;  Surgeon: Leandrew Koyanagi, MD;  Location: [place];  Service: Orthopedics;  Laterality: Left;   Left Carotid to sublcavian artery bypass w/ subclavian artery ligation     a. Performed @ [facility].   LEFT HEART CATH AND CORONARY ANGIOGRAPHY Left 06/29/2017   Procedure: LEFT HEART CATH AND CORONARY ANGIOGRAPHY;  Surgeon: Wellington Hampshire, MD;  Location: [place] CV LAB;  Service: Cardiovascular;  Laterality: Left;   LEFT HEART CATH AND CORONARY ANGIOGRAPHY N/A 09/20/2017   Procedure: LEFT HEART CATH AND CORONARY ANGIOGRAPHY;  Surgeon: Wellington Hampshire, MD;  Location: [place] [facility] LAB;  Service: Cardiovascular;  Laterality: N/A;   LEFT HEART CATH AND CORONARY ANGIOGRAPHY N/A 12/20/2017   Procedure: LEFT HEART CATH AND CORONARY ANGIOGRAPHY;  Surgeon: Wellington Hampshire, MD;  Location: [place] CV LAB;  Service: Cardiovascular;  Laterality: N/A;   LEFT HEART CATH AND CORONARY ANGIOGRAPHY N/A 04/20/2019   Procedure: LEFT HEART CATH AND CORONARY ANGIOGRAPHY possible PCI;  Surgeon: Minna Merritts, MD;  Location: [place] [facility] LAB;  Service: Cardiovascular;  Laterality: N/A;   LEFT HEART CATH AND CORONARY ANGIOGRAPHY N/A 12/13/2019   Procedure: LEFT HEART CATH AND CORONARY ANGIOGRAPHY;  Surgeon: Wellington Hampshire, MD;  Location: [place] [facility] LAB;  Service: Cardiovascular;  Laterality: N/A;   MELANOMA EXCISION  2016   Dr. Henreitta Cea ABLATION  2002   RIGHT OOPHORECTOMY     SHOULDER ARTHROSCOPY WITH OPEN ROTATOR CUFF REPAIR Right 01/07/2016   Procedure: SHOULDER ARTHROSCOPY WITH DEBRIDMENT,  SUBACHROMIAL DECOMPRESSION;  Surgeon: Corky Mull, MD;  Location: [facility];  Service: Orthopedics;  Laterality: Right;   SHOULDER ARTHROSCOPY WITH OPEN ROTATOR CUFF REPAIR Right 03/16/2017   Procedure: SHOULDER ARTHROSCOPY WITH OPEN ROTATOR CUFF REPAIR POSSIBLE BICEPS TENODESIS;  Surgeon: Corky Mull, MD;  Location: [facility];  Service: Orthopedics;  Laterality: Right;   TRIGGER FINGER RELEASE Right     [place]   Family History:  Family History  Problem Relation Age of Onset   Hypertension Mother    Anxiety disorder Mother    Depression Mother    Bipolar disorder Mother    Heart disease Mother        No details   Hyperlipidemia Mother    Kidney disease Father    Heart disease Father 70   Hypertension Father    Diabetes Father    Stroke Father    Colon cancer Father        dx in his 67's   Anxiety disorder Father    Depression Father    Skin cancer Father    Kidney disease Sister    Thyroid nodules Sister    Hypertension Sister    Hypertension Sister    Diabetes Sister    Hyperlipidemia Sister    Depression Sister    Breast cancer Maternal Aunt 80   Breast cancer Maternal Aunt 39   Ovarian cancer Cousin    Colon cancer Cousin    Kidney cancer Cousin    Breast cancer Other    Bladder Cancer Neg Hx    Family Psychiatric  History:  Social History:  Social History   Substance and Sexual Activity  Alcohol Use No   Alcohol/week: 0.0 standard drinks     Social History   Substance and Sexual Activity  Drug Use No    Social History   Socioeconomic History   Marital status: Divorced    Spouse name: Not on file   Number of children: 1   Years of education: Not on file   Highest education level: High school graduate  Occupational History   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 Disabled through 08/02/2013 per forms 02/03/2013  Tobacco Use   Smoking status: Former     Packs/day: 0.25    Years: 1.00    Pack years: 0.25    Types: Cigarettes    Quit date: 08/31/1994    Years since quitting: 26.5   Smokeless tobacco: Never   Tobacco comments:    quit 28 years ago  Vaping Use   Vaping Use: Never used  Substance and Sexual Activity   Alcohol use: No    Alcohol/week: 0.0 standard drinks   Drug use: No   Sexual activity: Not Currently    Birth control/protection: None, Surgical    Comment: Ablation  Other Topics Concern   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   Lives at home with son   Social Determinants of Health   Financial Resource Strain: Low Risk    Difficulty of Paying Living Expenses: Not hard at all  Food Insecurity: No Food Insecurity   Worried About Charity fundraiser in the Last Year: Never true   Arboriculturist in the Last Year: Never true  Transportation Needs: No Transportation Needs   Lack of Transportation (Medical): No   Lack of Transportation (Non-Medical): No  Physical Activity: Not on file  Stress: Stress Concern Present   Feeling of Stress : Rather much  Social Connections: Moderately Integrated   Frequency of Communication with Friends and Family: More than three times a week   Frequency of Social Gatherings with  Friends and Family: Twice a week   Attends Religious Services: More than 4 times per year   Active Member of Clubs or Organizations: Yes   Attends Music therapist: More than 4 times per year   Marital Status: Divorced   Additional Social History:    Allergies:   Allergies  Allergen Reactions   Lipitor [Atorvastatin] Other (See Comments)    Leg pains    Labs:  Results for orders placed or performed during the hospital encounter of 03/08/21 (from the past 48 hour(s))  Glucose, capillary     Status: Abnormal   Collection Time: 03/12/21  7:24 PM  Result Value Ref Range   Glucose-Capillary  190 (H) 70 - 99 mg/dL    Comment: Glucose reference range applies only to samples taken after fasting for at least 8 hours.  Glucose, capillary     Status: Abnormal   Collection Time: 03/13/21 12:54 AM  Result Value Ref Range   Glucose-Capillary 174 (H) 70 - 99 mg/dL    Comment: Glucose reference range applies only to samples taken after fasting for at least 8 hours.  Glucose, capillary     Status: Abnormal   Collection Time: 03/13/21  4:57 AM  Result Value Ref Range   Glucose-Capillary 150 (H) 70 - 99 mg/dL    Comment: Glucose reference range applies only to samples taken after fasting for at least 8 hours.  CBC     Status: Abnormal   Collection Time: 03/13/21  7:38 AM  Result Value Ref Range   WBC 9.8 4.0 - 10.5 K/uL   RBC 4.24 3.87 - 5.11 MIL/uL   Hemoglobin 11.6 (L) 12.0 - 15.0 g/dL   HCT 35.6 (L) 36.0 - 46.0 %   MCV 84.0 80.0 - 100.0 fL   MCH 27.4 26.0 - 34.0 pg   MCHC 32.6 30.0 - 36.0 g/dL   RDW 14.8 11.5 - 15.5 %   Platelets 166 150 - 400 K/uL   nRBC 0.0 0.0 - 0.2 %    Comment: Performed at [facility], [place] [name], [place], [place] [address]  Basic metabolic panel     Status: Abnormal   Collection Time: 03/13/21  7:38 AM  Result Value Ref Range   Sodium 135 135 - 145 mmol/L   Potassium 3.9 3.5 - 5.1 mmol/L   Chloride 101 98 - 111 mmol/L   CO2 26 22 - 32 mmol/L   Glucose, Bld 152 (H) 70 - 99 mg/dL    Comment: Glucose reference range applies only to samples taken after fasting for at least 8 hours.   BUN 32 (H) 6 - 20 mg/dL   Creatinine, Ser 1.83 (H) 0.44 - 1.00 mg/dL   Calcium 8.3 (L) 8.9 - 10.3 mg/dL   GFR, Estimated 33 (L) >60 mL/min    Comment: (NOTE) Calculated using the CKD-EPI Creatinine Equation (2021)    Anion gap 8 5 - 15    Comment: Performed at [facility] [address] [place], [place] [address]  Magnesium     Status: Abnormal   Collection Time: 03/13/21  7:38 AM  Result Value Ref Range   Magnesium 2.5 (H) 1.7 - 2.4 mg/dL     Comment: Performed at [facility] [address] [place], [place] [address]  Glucose, capillary     Status: Abnormal   Collection Time: 03/13/21  8:01 AM  Result Value Ref Range   Glucose-Capillary 158 (H) 70 - 99 mg/dL  Comment: Glucose reference range applies only to samples taken after fasting for at least 8 hours.  Glucose, capillary     Status: Abnormal   Collection Time: 03/13/21 11:55 AM  Result Value Ref Range   Glucose-Capillary 171 (H) 70 - 99 mg/dL    Comment: Glucose reference range applies only to samples taken after fasting for at least 8 hours.  Glucose, capillary     Status: Abnormal   Collection Time: 03/13/21  3:43 PM  Result Value Ref Range   Glucose-Capillary 123 (H) 70 - 99 mg/dL    Comment: Glucose reference range applies only to samples taken after fasting for at least 8 hours.  Blood gas, arterial     Status: Abnormal   Collection Time: 03/13/21  8:25 PM  Result Value Ref Range   FIO2 21.00    pH, Arterial 7.438 7.350 - 7.450   pCO2 arterial 38.7 32.0 - 48.0 mmHg   pO2, Arterial 76.2 (L) 83.0 - 108.0 mmHg   Bicarbonate 25.8 20.0 - 28.0 mmol/L   Acid-Base Excess 1.9 0.0 - 2.0 mmol/L   O2 Saturation 94.9 %   Patient temperature 37.0    Collection site LEFT RADIAL    Drawn by [id number]    Sample type ARTERIAL DRAW    Allens test (pass/fail) PASS PASS    Comment: Performed at [facility], [place] [address], [place], [place] [address]  Glucose, capillary     Status: Abnormal   Collection Time: 03/13/21  8:33 PM  Result Value Ref Range   Glucose-Capillary 229 (H) 70 - 99 mg/dL    Comment: Glucose reference range applies only to samples taken after fasting for at least 8 hours.  Glucose, capillary     Status: Abnormal   Collection Time: 03/13/21 11:28 PM  Result Value Ref Range   Glucose-Capillary 166 (H) 70 - 99 mg/dL    Comment: Glucose reference range applies only to samples taken after fasting for at least 8 hours.  Magnesium     Status:  Abnormal   Collection Time: 03/14/21  1:18 AM  Result Value Ref Range   Magnesium 2.5 (H) 1.7 - 2.4 mg/dL    Comment: Performed at [facility] [address], [place], [place] [address]  Basic metabolic panel     Status: Abnormal   Collection Time: 03/14/21  1:18 AM  Result Value Ref Range   Sodium 132 (L) 135 - 145 mmol/L   Potassium 4.5 3.5 - 5.1 mmol/L   Chloride 100 98 - 111 mmol/L   CO2 24 22 - 32 mmol/L   Glucose, Bld 123 (H) 70 - 99 mg/dL    Comment: Glucose reference range applies only to samples taken after fasting for at least 8 hours.   BUN 33 (H) 6 - 20 mg/dL   Creatinine, Ser 1.94 (H) 0.44 - 1.00 mg/dL   Calcium 8.1 (L) 8.9 - 10.3 mg/dL   GFR, Estimated 31 (L) >60 mL/min    Comment: (NOTE) Calculated using the CKD-EPI Creatinine Equation (2021)    Anion gap 8 5 - 15    Comment: Performed at [facility] [address], [place] [address]  CBC     Status: Abnormal   Collection Time: 03/14/21  1:18 AM  Result Value Ref Range   WBC 9.6 4.0 - 10.5 K/uL   RBC 3.81 (L) 3.87 - 5.11 MIL/uL   Hemoglobin 10.7 (L) 12.0 - 15.0 g/dL   HCT 31.7 (  L) 36.0 - 46.0 %   MCV 83.2 80.0 - 100.0 fL   MCH 28.1 26.0 - 34.0 pg   MCHC 33.8 30.0 - 36.0 g/dL   RDW 14.9 11.5 - 15.5 %   Platelets 142 (L) 150 - 400 K/uL   nRBC 0.0 0.0 - 0.2 %    Comment: Performed at [facility] [address], [place], [place] [address]  Glucose, capillary     Status: Abnormal   Collection Time: 03/14/21  3:14 AM  Result Value Ref Range   Glucose-Capillary 143 (H) 70 - 99 mg/dL    Comment: Glucose reference range applies only to samples taken after fasting for at least 8 hours.  Glucose, capillary     Status: Abnormal   Collection Time: 03/14/21  8:00 AM  Result Value Ref Range   Glucose-Capillary 167 (H) 70 - 99 mg/dL    Comment: Glucose reference range applies only to samples taken after fasting for at least 8 hours.   Comment 1 Notify RN    Comment 2 Document in Chart    Glucose, capillary     Status: Abnormal   Collection Time: 03/14/21 11:49 AM  Result Value Ref Range   Glucose-Capillary 131 (H) 70 - 99 mg/dL    Comment: Glucose reference range applies only to samples taken after fasting for at least 8 hours.   Comment 1 Notify RN    Comment 2 Document in Chart    *Note: Due to a large number of results and/or encounters for the requested time period, some results have not been displayed. A complete set of results can be found in Results Review.    Current Facility-Administered Medications  Medication Dose Route Frequency Provider Last Rate Last Admin   acetaminophen (TYLENOL) 160 MG/5ML solution 650 mg  650 mg Per Tube Q6H PRN Maryjane Hurter, MD       aspirin chewable tablet 81 mg  81 mg Per Tube Daily Joselyn Glassman A, RPH   81 mg at 03/14/21 0932   baclofen (LIORESAL) tablet 5 mg  5 mg Per Tube BID Maryjane Hurter, MD   5 mg at 03/14/21 0933   carvedilol (COREG) tablet 12.5 mg  12.5 mg Per Tube BID WC Maryjane Hurter, MD   12.5 mg at 03/14/21 0933   chlorhexidine (PERIDEX) 0.12 % solution 15 mL  15 mL Mouth Rinse BID Maryjane Hurter, MD   15 mL at 03/14/21 0932   Chlorhexidine Gluconate Cloth 2 % PADS 6 each  6 each Topical Daily Frederik Pear, MD   6 each at 03/14/21 0934   clopidogrel (PLAVIX) tablet 75 mg  75 mg Per Tube Daily Joselyn Glassman A, RPH   75 mg at 03/14/21 0932   docusate (COLACE) 50 MG/5ML liquid 100 mg  100 mg Per Tube BID PRN Joselyn Glassman A, RPH       gabapentin (NEURONTIN) 250 MG/5ML solution 100 mg  100 mg Per Tube Q12H Maryjane Hurter, MD   100 mg at 03/14/21 0934   heparin injection 5,000 Units  5,000 Units Subcutaneous Q8H Estill Cotta, NP   5,000 Units at 03/14/21 1233   hydrALAZINE (APRESOLINE) injection 10-20 mg  10-20 mg Intravenous Q4H PRN Frederik Pear, MD   20 mg at 03/13/21 0048   hydrALAZINE (APRESOLINE) tablet 25 mg  25 mg Per Tube TID Maryjane Hurter, MD   25 mg at 03/14/21 0932    insulin aspart (novoLOG)  injection 0-15 Units  0-15 Units Subcutaneous Q4H Maryjane Hurter, MD   2 Units at 03/14/21 1234   isosorbide mononitrate (IMDUR) 24 hr tablet 60 mg  60 mg Oral Daily Shelly Coss, MD   60 mg at 03/14/21 1233   labetalol (NORMODYNE) injection 10-20 mg  10-20 mg Intravenous Q2H PRN Frederik Pear, MD   20 mg at 03/14/21 4287   lacosamide (VIMPAT) tablet 200 mg  200 mg Per Tube BID Greta Doom, MD   200 mg at 03/14/21 0932   lactated ringers bolus 1,000 mL  1,000 mL Intravenous Once Maryjane Hurter, MD   Held at 03/13/21 2028   levETIRAcetam (KEPPRA) 100 MG/ML solution 1,000 mg  1,000 mg Per Tube BID Greta Doom, MD   1,000 mg at 03/14/21 0932   levothyroxine (SYNTHROID) tablet 25 mcg  25 mcg Per Tube QAC breakfast Joselyn Glassman A, RPH   25 mcg at 03/14/21 0554   LORazepam (ATIVAN) tablet 0.5 mg  0.5 mg Oral BID PRN Armando Reichert, MD       MEDLINE mouth rinse  15 mL Mouth Rinse q12n4p Maryjane Hurter, MD   15 mL at 03/13/21 1651   multivitamin with minerals tablet 1 tablet  1 tablet Per Tube BID Magdalen Spatz, NP   1 tablet at 03/14/21 0932   ondansetron (ZOFRAN) injection 4 mg  4 mg Intravenous Q6H PRN Mick Sell, PA-C   4 mg at 03/14/21 6811   polyethylene glycol (MIRALAX / GLYCOLAX) packet 17 g  17 g Per Tube Daily PRN Joselyn Glassman A, RPH       promethazine (PHENERGAN) 12.5 mg in sodium chloride 0.9 % 50 mL IVPB  12.5 mg Intravenous Q6H PRN Mick Sell, PA-C   Stopped at 03/13/21 1016   pyridOXINE (VITAMIN B-6) tablet 100 mg  100 mg Per Tube Daily Maryjane Hurter, MD   100 mg at 03/14/21 0933   rosuvastatin (CRESTOR) tablet 40 mg  40 mg Per Tube q1800 Joselyn Glassman A, RPH   40 mg at 03/13/21 1651   sodium chloride flush (NS) 0.9 % injection 10-40 mL  10-40 mL Intracatheter Q12H Maryjane Hurter, MD   10 mL at 03/14/21 0934   sodium chloride flush (NS) 0.9 % injection 10-40 mL  10-40 mL Intracatheter PRN Maryjane Hurter,  MD       sulfamethoxazole-trimethoprim (BACTRIM DS) 800-160 MG per tablet 1 tablet  1 tablet Oral Q12H Shelly Coss, MD       [facility]) tablet 37.5 mg  37.5 mg Per Tube BID WC Maryjane Hurter, MD   37.5 mg at 03/14/21 0932   Vitamin D (Ergocalciferol) (DRISDOL) capsule 50,000 Units  50,000 Units Per Tube Q7 days Maryjane Hurter, MD   50,000 Units at 03/13/21 1650    Musculoskeletal: Strength & Muscle Tone:  Not tested Gait & Station:  Deferred Patient leans: N/A            Psychiatric Specialty Exam:  Presentation  General Appearance: Disheveled (DANDRUFF in hair)  Eye Contact:Minimal  Speech:Normal Rate  Speech Volume:No data recorded Handedness:No data recorded  Mood and Affect  Mood:Irritable; Depressed (Helpless)  Affect:Constricted   Thought Process  Thought Processes:-- (Sometimes incoherant)  Descriptions of Associations:Circumstantial  Orientation:Full (Time, Place and Person)  Thought Content:Perseveration; Rumination  History of Schizophrenia/Schizoaffective disorder:No data recorded Duration of Psychotic Symptoms:No data recorded Hallucinations:Hallucinations: None  Ideas of Reference:None  Suicidal Thoughts:Suicidal Thoughts: No  Homicidal Thoughts:Homicidal Thoughts: No   Sensorium  Memory:Immediate Fair; Remote Poor; Recent Fair  Judgment:Poor  Insight:Poor   Executive Functions  Concentration:Poor  Attention Span:Fair  Recall:Poor  Fund of Knowledge:Poor  Language:Fair   Psychomotor Activity  Psychomotor Activity:Psychomotor Activity: Normal   Assets  Assets:Social Support   Sleep  Sleep:Sleep: Good   Physical Exam: Physical Exam Review of Systems  Psychiatric/Behavioral:  Positive for depression and suicidal ideas. The patient is nervous/anxious.   Blood pressure (!) 167/75, pulse (!) 57, temperature 98 F (36.7 C), temperature source Oral, resp. rate 16, height _0  (1.6 m), weight  74.3 kg, SpO2 98 %. Body mass index is 29.02 kg/m.  Armando Reichert, MD 03/14/2021 4:39 PM

## 2021-03-14 NOTE — Progress Notes (Signed)
Speech Language Pathology Treatment: Dysphagia  Patient Details Name: [name] MRN: [id number] DOB: 1970/04/25 Today's Date: 03/14/2021 Time: 3235-5732 SLP Time Calculation (min) (ACUTE ONLY): 19 min  Assessment / Plan / Recommendation Clinical Impression  Pt seen for ongoing dysphagia management.  Pt awake and alert today, but seemingly altered.   Pt tolerated mechanical soft solids and thin liquids with no clinical s/s of aspiration.  There was trace-mild oral residue with soft solids.  Pt appeared to benefit from liquid wash.  Pt was unable to bite and masticate regular solid. Pt exhibited significant difficulty with self feeding and stated she was unable to eat herself.  She was unable to manipulate utensil.  She brought finger food to her mouth with great difficulty.  Was noted to drop items she tried to hold for feeding. Pt has active OT orders at this time.   During this session pt made several requests. She asked for pain medication, but denied pain. She stated she wanted pain medicine for her anxiety.  RN confirms that pt cannot have narcotic medication at this time given lethargy post medication administration yesterday. Pt also complains of nausea.  RN gave medication for nausea this morning and cannot give more at this time.  Pt told this therapist she wanted to die and she wants to go home and take her pills and be left to die. RN notified and taking appropriate actions.     HPI HPI: [name], is a 51 y.o. female, who presented to the [facility] ED as a transfer from [facility] ED with a chief complaint of AMS who needed continuous EEG. EEG 2/6 abnormal.  Pt being treated for UTI as well.  Head CT and CXR 2/4 without acute findings      SLP Plan  Continue with current plan of care      Recommendations for follow up therapy are one component of a multi-disciplinary discharge planning process, led by the attending physician.  Recommendations may be updated based on  patient status, additional functional criteria and insurance authorization.    Recommendations  Diet recommendations: Dysphagia 3 (mechanical soft);Thin liquid Liquids provided via: Cup;Straw Medication Administration: Whole meds with liquid Supervision: Trained caregiver to feed patient Compensations: Slow rate;Small sips/bites;Follow solids with liquid Postural Changes and/or Swallow Maneuvers: Seated upright 90 degrees                Oral Care Recommendations: Oral care BID Follow Up Recommendations: Follow physician's recommendations for discharge plan and follow up therapies Assistance recommended at discharge: Frequent or constant Supervision/Assistance SLP Visit Diagnosis: Dysphagia, oral phase (R13.11) Plan: Continue with current plan of care           [name], [place], [place] Office: [phone]  03/14/2021, 11:48 AM

## 2021-03-14 NOTE — Progress Notes (Signed)
Inpatient Rehabilitation Admissions Coordinator   I will place a rehab consult and an Admissions Coordinator will follow up for full assessment.  Danne Baxter, RN, MSN Rehab Admissions Coordinator [phone] 03/14/2021 4:02 PM

## 2021-03-14 NOTE — Progress Notes (Signed)
Called son to update on patient. Spoke to son about patient's concerns about her going home with him. Also notified him about the patient suicidal thoughts. Son stated that he wanted his mother to come home and she did not need to go to rehab. Informed patient that was not what we were discussing. Son stated that the patient has had depression most of his life and she needs some help but she does not need to go to inpatient behavior health or rehab. Son stated they will treat her badly and she will be fine home. Informed son that the  patient was max assist and she could not walk at this time. He stated that was ok he will take care of her.

## 2021-03-15 ENCOUNTER — Inpatient Hospital Stay (HOSPITAL_COMMUNITY): Payer: Medicare Other

## 2021-03-15 LAB — BASIC METABOLIC PANEL
Anion gap: 9 (ref 5–15)
BUN: 28 mg/dL — ABNORMAL HIGH (ref 6–20)
CO2: 25 mmol/L (ref 22–32)
Calcium: 8.5 mg/dL — ABNORMAL LOW (ref 8.9–10.3)
Chloride: 101 mmol/L (ref 98–111)
Creatinine, Ser: 2.02 mg/dL — ABNORMAL HIGH (ref 0.44–1.00)
GFR, Estimated: 29 mL/min — ABNORMAL LOW (ref >60)
Glucose, Bld: 105 mg/dL — ABNORMAL HIGH (ref 70–99)
Potassium: 5.1 mmol/L (ref 3.5–5.1)
Sodium: 135 mmol/L (ref 135–145)

## 2021-03-15 LAB — GLUCOSE, CAPILLARY
Glucose-Capillary: 107 mg/dL — ABNORMAL HIGH (ref 70–99)
Glucose-Capillary: 109 mg/dL — ABNORMAL HIGH (ref 70–99)
Glucose-Capillary: 118 mg/dL — ABNORMAL HIGH (ref 70–99)
Glucose-Capillary: 147 mg/dL — ABNORMAL HIGH (ref 70–99)
Glucose-Capillary: 152 mg/dL — ABNORMAL HIGH (ref 70–99)
Glucose-Capillary: 91 mg/dL (ref 70–99)

## 2021-03-15 MED ORDER — ISOSORBIDE MONONITRATE ER 60 MG PO TB24
120.0000 mg | ORAL_TABLET | Freq: Every day | ORAL | Status: DC
  Administered 2021-03-16 – 2021-03-20 (×5): 120 mg via ORAL
  Filled 2021-03-15 (×5): qty 2

## 2021-03-15 MED ORDER — HYDRALAZINE HCL 50 MG PO TABS
50.0000 mg | ORAL_TABLET | Freq: Three times a day (TID) | ORAL | Status: DC
  Administered 2021-03-15 (×2): 50 mg
  Filled 2021-03-15 (×2): qty 1

## 2021-03-15 NOTE — PMR Pre-admission (Shared)
PMR Admission Coordinator Pre-Admission Assessment  Patient: [name] is an 51 y.o., female MRN: [id number] DOB: [birth year] Height: '5\' 3"'  (160 cm) Weight: 74.3 kg  Insurance Information HMO: ***    PPO: ***     PCP:      IPA:      80/20:      OTHER:  PRIMARY: U[employer] Medicare      Policy#: [id number]      Subscriber: patient CM Name: ***      Phone#: ***     Fax#: *** Pre-Cert#: ***      Employer: *** Benefits:  Phone #: ***     Name: *** Irene Shipper. Date: ***     Deduct: ***      Out of Pocket Max: ***      Life Max: *** CIR: ***      SNF: *** Outpatient: ***     Co-Pay: *** Home Health: ***      Co-Pay: *** DME: ***     Co-Pay: *** Providers: in-network SECONDARY:       Policy#:      Phone#:   Financial Counselor:       Phone#:   The Actuary for patients in Inpatient Rehabilitation Facilities with attached Privacy Act [place] Records was provided and verbally reviewed with: {CHL IP Patient Family ZS:[id number]}  Emergency Contact Information Contact Information     Name Relation Home Work Mobile   Mastin,Jacob Son [phone]  [phone]   Siri Cole [phone]  [phone]       Current Medical History  Patient Admitting Diagnosis: debility d/t metabolic encephalopathy and UTI History of Present Illness: *** Complete NIHSS TOTAL: 12  Patient's medical record from [facility] has been reviewed by the rehabilitation admission coordinator and physician.  Past Medical History  Past Medical History:  Diagnosis Date   Acute colitis 01/27/2017   Acute pyelonephritis    Acute upper GI bleed 01/25/2020   Anemia    iron deficiency anemia   Aortic arch aneurysm    BRCA negative 2014   CAD (coronary artery disease)    a. 08/2003 Cath: LAD 30-40-med Rx; b. 11/2014 PCI: LAD 77m(3.25x23 Xience Alpine DES); c. 06/2015 PCI: D1 (2.25x12 Resolute Integrity DES); d. 06/2017 PCI: Patent mLAD stent, D2 95 (PTCA); e.  09/2017 PCI: D2 99ost (CBA); d. 12/2017 Cath: LM nl, LAD 355m80d (small), D1 40ost, D2 95ost, LCX 40p, RCA 40ost/p->Med rx for D2 given restenosis.   Closed nondisplaced intertrochanteric fracture of left femur ([place]9/10/2018   Colitis 06/03/2015   Colon polyp    CVA (cerebral vascular accident) ([facility]   Left side weakness.    Degenerative tear of glenoid labrum of right shoulder 03/15/2017   Diabetes mellitus without complication ([facility]   Family history of breast cancer    BRCA neg 2014   Femur fracture, left ([place]9/10/2018   Gastric ulcer 04/07/25/0350 Helicobacter pylori infection 11/23/2014   History of echocardiogram    a. 03/2017 Echo: EF 60-65%, no rwma; b. 02/2018 Echo: EF 60-65%, no rwma. Nl RV fxn. No cardiac source of emboli (admitted w/ stroke).   Hypertension    Iron deficiency anemia secondary to blood loss (chronic)    Malignant melanoma of skin of scalp (HCC)    MI, acute, non ST segment elevation (HCC)    Neuromuscular disorder (HCC)    S/P drug eluting coronary stent placement 06/04/2015   Sepsis ([place]/14/2019  Sepsis secondary to UTI ([place]) 04/23/2020   Stroke ([place])    a. 02/2018 MRI: 34m late acute/early subacute L medial frontal lobe inarct; b. 02/2018 MRA No large vessel occlusion or aneurysm. Mod to sev L P2 stenosis. thready L vertebral artery, diffusely dzs'd; c. 02/2018 Carotid U/S: <50% bilat ICA dzs.   Unspecified atrial fibrillation ([facility] 04/23/2020    Has the patient had major surgery during 100 days prior to admission? No  Family History   family history includes Anxiety disorder in her father and mother; Bipolar disorder in her mother; Breast cancer in an other family member; Breast cancer (age of onset: 569 in her maternal aunt and maternal aunt; Colon cancer in her cousin and father; Depression in her father, mother, and sister; Diabetes in her father and sister; Heart disease in her mother; Heart disease (age of onset: 484 in her father; Hyperlipidemia in her  mother and sister; Hypertension in her father, mother, sister, and sister; Kidney cancer in her cousin; Kidney disease in her father and sister; Ovarian cancer in her cousin; Skin cancer in her father; Stroke in her father; Thyroid nodules in her sister.  Current Medications  Current Facility-Administered Medications:    acetaminophen (TYLENOL) 160 MG/5ML solution 650 mg, 650 mg, Per Tube, Q6H PRN, MMaryjane Hurter MD   aspirin chewable tablet 81 mg, 81 mg, Per Tube, Daily, PJoselyn GlassmanA, RPH, 81 mg at 03/15/21 0828   baclofen (LIORESAL) tablet 5 mg, 5 mg, Per Tube, BID, MMaryjane Hurter MD, 5 mg at 03/15/21 05597  carvedilol (COREG) tablet 12.5 mg, 12.5 mg, Per Tube, BID WC, MMaryjane Hurter MD, 12.5 mg at 03/15/21 0827   chlorhexidine (PERIDEX) 0.12 % solution 15 mL, 15 mL, Mouth Rinse, BID, MMaryjane Hurter MD, 15 mL at 03/15/21 04163  Chlorhexidine Gluconate Cloth 2 % PADS 6 each, 6 each, Topical, Daily, Ogan, Okoronkwo U, MD, 6 each at 03/15/21 0830   clopidogrel (PLAVIX) tablet 75 mg, 75 mg, Per Tube, Daily, PJoselyn GlassmanA, RPH, 75 mg at 03/15/21 0828   docusate (COLACE) 50 MG/5ML liquid 100 mg, 100 mg, Per Tube, BID PRN, PLevada Dy Dwayne A, RPH   gabapentin (NEURONTIN) 250 MG/5ML solution 100 mg, 100 mg, Per Tube, Q12H, MMaryjane Hurter MD, 100 mg at 03/15/21 0827   heparin injection 5,000 Units, 5,000 Units, Subcutaneous, Q8H, GEstill Cotta NP, 5,000 Units at 03/15/21 1419   hydrALAZINE (APRESOLINE) injection 10-20 mg, 10-20 mg, Intravenous, Q4H PRN, Ogan, Okoronkwo U, MD, 20 mg at 03/13/21 0048   hydrALAZINE (APRESOLINE) tablet 50 mg, 50 mg, Per Tube, TID, Adhikari, Amrit, MD   insulin aspart (novoLOG) injection 0-15 Units, 0-15 Units, Subcutaneous, Q4H, MMaryjane Hurter MD, 3 Units at 03/15/21 1200   [START ON 03/16/2021] isosorbide mononitrate (IMDUR) 24 hr tablet 120 mg, 120 mg, Oral, Daily, Adhikari, Amrit, MD   labetalol (NORMODYNE) injection 10-20 mg, 10-20  mg, Intravenous, Q2H PRN, Ogan, Okoronkwo U, MD, 20 mg at 03/14/21 0212   lacosamide (VIMPAT) tablet 200 mg, 200 mg, Per Tube, BID, KGreta Doom MD, 200 mg at 03/15/21 08453  lactated ringers bolus 1,000 mL, 1,000 mL, Intravenous, Once, MMaryjane Hurter MD, Held at 03/13/21 2028   levETIRAcetam (KEPPRA) 100 MG/ML solution 1,000 mg, 1,000 mg, Per Tube, BID, KGreta Doom MD, 1,000 mg at 03/15/21 06468  levothyroxine (SYNTHROID) tablet 25 mcg, 25 mcg, Per Tube, QAC breakfast, PJoselyn GlassmanA, RPH, 25 mcg at 03/15/21 [phone]  LORazepam (ATIVAN) tablet 0.5 mg, 0.5 mg, Oral, BID PRN, Armando Reichert, MD   MEDLINE mouth rinse, 15 mL, Mouth Rinse, q12n4p, Verlee Monte Hortencia Conradi, MD, 15 mL at 03/15/21 1200   multivitamin with minerals tablet 1 tablet, 1 tablet, Per Tube, BID, Magdalen Spatz, NP, 1 tablet at 03/14/21 2044   ondansetron [name]) injection 4 mg, 4 mg, Intravenous, Q6H PRN, Mick Sell, PA-C, 4 mg at 03/15/21 1007   polyethylene glycol (MIRALAX / GLYCOLAX) packet 17 g, 17 g, Per Tube, Daily PRN, Levada Dy, Dwayne A, RPH   promethazine (PHENERGAN) 12.5 mg in sodium chloride 0.9 % 50 mL IVPB, 12.5 mg, Intravenous, Q6H PRN, Rollene Rotunda, John D, PA-C, Stopped at 03/13/21 1016   pyridOXINE (VITAMIN B-6) tablet 100 mg, 100 mg, Per Tube, Daily, Maryjane Hurter, MD, 100 mg at 03/15/21 0828   rosuvastatin (CRESTOR) tablet 40 mg, 40 mg, Per Tube, q1800, Pierce, Dwayne A, RPH, 40 mg at 03/14/21 1802   sodium chloride flush (NS) 0.9 % injection 10-40 mL, 10-40 mL, Intracatheter, Q12H, Maryjane Hurter, MD, 10 mL at 03/15/21 5643   sodium chloride flush (NS) 0.9 % injection 10-40 mL, 10-40 mL, Intracatheter, PRN, Maryjane Hurter, MD   sulfamethoxazole-trimethoprim (BACTRIM DS) 800-160 MG per tablet 1 tablet, 1 tablet, Oral, Q12H, Adhikari, Amrit, MD, 1 tablet at 03/15/21 0852   venlafaxine [facility]) tablet 37.5 mg, 37.5 mg, Per Tube, BID WC, Maryjane Hurter, MD, 37.5 mg at 03/15/21  3295   Vitamin D (Ergocalciferol) (DRISDOL) capsule 50,000 Units, 50,000 Units, Per Tube, Q7 days, Maryjane Hurter, MD, 50,000 Units at 03/13/21 1650  Patients Current Diet:  Diet Order             DIET DYS 3 Room service appropriate? Yes; Fluid consistency: Thin  Diet effective now                   Precautions / Restrictions Precautions Precautions: Fall Precaution Comments: seizures Restrictions Weight Bearing Restrictions: No   Has the patient had 2 or more falls or a fall with injury in the past year? Yes  Prior Activity Level Limited Community (1-2x/wk): gets out of house for MD appointments  Prior Functional Level Self Care: Did the patient need help bathing, dressing, using the toilet or eating? Independent  Indoor Mobility: Did the patient need assistance with walking from room to room (with or without device)? Independent  Stairs: Did the patient need assistance with internal or external stairs (with or without device)? Needed some help  Functional Cognition: Did the patient need help planning regular tasks such as shopping or remembering to take medications? Independent  Patient Information    Patient's Response To:     Home Assistive Devices / Equipment Home Equipment: Rollator (4 wheels), Cane - single point, Shower seat  Prior Device Use: Indicate devices/aids used by the patient prior to current illness, exacerbation or injury? Walker  Current Functional Level Cognition  Overall Cognitive Status: No family/caregiver present to determine baseline cognitive functioning Current Attention Level: Focused Orientation Level: Oriented X4 Following Commands: Follows one step commands inconsistently Safety/Judgement: Decreased awareness of safety, Decreased awareness of deficits General Comments: pt reports wanting to die, because she doesn't want her son to see her this way. Psychiatrist in room during conversation    Extremity Assessment (includes  Sensation/Coordination)  Upper Extremity Assessment: Defer to OT evaluation RUE Deficits / Details: moving spontaneously however not ableto maintain grasp on cloth to wash face; unsure if this  is her hemiplegic side; Previous note sasy L hemiplegic; pt staes her stroke affected her R side; abnormal tone present; will further assess RUE Coordination: decreased fine motor, decreased gross motor LUE Deficits / Details: not using funcitonally; abnormal tone; unsure if hemiplegic side; not suing functionally; hand folding underneath her with attempts at bed mobility LUE Coordination: decreased fine motor, decreased gross motor  Lower Extremity Assessment: Difficult to assess due to impaired cognition, RLE deficits/detail, LLE deficits/detail RLE Deficits / Details: AAROM limited increased tone or resistance to ankle movement, and knee movement, strength assessed in mobility 2+/5 RLE Coordination: decreased fine motor, decreased gross motor LLE Deficits / Details: AAROM limited increased tone or resistance to ankle movement, and knee movement, R LE with more movement than L LE, strength assessed in mobility 2+/5 LLE Coordination: decreased fine motor, decreased gross motor    ADLs  Overall ADL's : Needs assistance/impaired Toilet Transfer: Maximal assistance, +2 for physical assistance, +2 for safety/equipment, Stand-pivot Toilet Transfer Details (indicate cue type and reason): PT rigid and not following commands to stand and step transfer to the chair General ADL Comments: Session limited due to psychiatrist coming in and cutting session short.    Mobility  Overal bed mobility: Needs Assistance Bed Mobility: Supine to Sit Rolling: Mod assist Sidelying to sit: Max assist Supine to sit: HOB elevated, Mod assist Sit to sidelying: Max assist General bed mobility comments: modA for management of LE off bed and for bringing trunk to upright    Transfers  Overall transfer level: Needs  assistance Equipment used: 2 person hand held assist Transfers: Sit to/from Stand, Bed to chair/wheelchair/BSC Sit to Stand: Mod assist, Max assist Bed to/from chair/wheelchair/BSC transfer type:: Step pivot Step pivot transfers: Total assist, +2 physical assistance General transfer comment: modA for power up but requires maxAx2 to bring CoG over BoS, able to take small lateral steps towards recliner however unable to move feet with pivot and ends up with legs crossed, increased L lateral push requiring total A for uncrossing LE and despite maximal cuing for sitting pt unable to flex at hips to sit, total A to come to seated in recliner    Ambulation / Gait / Stairs / Wheelchair Mobility  Ambulation/Gait General Gait Details: unable    Posture / Balance Dynamic Sitting Balance Sitting balance - Comments: requires assist initially able to progress to seated balance with contact guard assist Balance Overall balance assessment: Needs assistance Sitting-balance support: Feet supported Sitting balance-Leahy Scale: Poor Sitting balance - Comments: requires assist initially able to progress to seated balance with contact guard assist Standing balance support: Bilateral upper extremity supported Standing balance-Leahy Scale: Zero Standing balance comment: requires maxAx2 for static standing    Special needs/care consideration Continuous Drip IV  0.9% sodium chloride infusion, Skin Abrasion: wrist/right,left; Blister: wrist; Ecchymosis: scattered/circumferential, Bowel incontinence, and Diabetic management novoLOG 0-15 units every 4 hours   Previous Home Environment (from acute therapy documentation) Living Arrangements: Children  Lives With: Son Available Help at Discharge: Family, Available 24 hours/day Type of Home: House Home Layout: One level Home Access: Ramped entrance Bathroom Shower/Tub: Chiropodist: Standard Bathroom Accessibility: Yes How Accessible:  Accessible via walker Home Care Services: No Additional Comments: pt poor historian, home set up taken from prior visits  Discharge Living Setting Plans for Discharge Living Setting: Patient's home Type of Home at Discharge: House Discharge Home Layout: One level Discharge Home Access: Ramped entrance Discharge Bathroom Shower/Tub: Brady unit Discharge Bathroom Toilet: Standard Discharge  Bathroom Accessibility: Yes How Accessible: Accessible via walker Does the patient have any problems obtaining your medications?: No  Social/Family/Support Systems Anticipated Caregiver: Marny Lowenstein, son and pt's mother Anticipated Caregiver's Contact Information: [name]: [phone] Caregiver Availability: 24/7 Discharge Plan Discussed with Primary Caregiver: Yes Is Caregiver In Agreement with Plan?: Yes Does Caregiver/Family have Issues with Lodging/Transportation while Pt is in Rehab?: No  Goals Patient/Family Goal for Rehab: *** Expected length of stay: *** Pt/Family Agrees to Admission and willing to participate: Yes Program Orientation Provided & Reviewed with Pt/Caregiver Including Roles  & Responsibilities: Yes  Decrease burden of Care through IP rehab admission: NA  Possible need for SNF placement upon discharge: Not anticipated  Patient Condition: I have reviewed medical records from [facility], spoken with CM, and patient and son. I met with patient at the bedside and discussed via phone for inpatient rehabilitation assessment.  Patient will benefit from ongoing PT and OT, can actively participate in 3 hours of therapy a day 5 days of the week, and can make measurable gains during the admission.  Patient will also benefit from the coordinated team approach during an Inpatient Acute Rehabilitation admission.  The patient will receive intensive therapy as well as Rehabilitation physician, nursing, social worker, and care management interventions.  Due to bowel management,  safety, skin/wound care, disease management, medication administration, and patient education the patient requires 24 hour a day rehabilitation nursing.  The patient is currently *** with mobility and basic ADLs.  Discharge setting and therapy post discharge at home with home health is anticipated.  Patient has agreed to participate in the Acute Inpatient Rehabilitation Program and will admit {Time; today/tomorrow:10263}.  Preadmission Screen Completed By:  Bethel Born, 03/15/2021 3:37 PM ______________________________________________________________________   Discussed status with Dr. Marland Kitchen on *** at *** and received approval for admission today.  Admission Coordinator:  Bethel Born, CCC-SLP, time ***/Date ***   Assessment/Plan: Diagnosis: Does the need for close, 24 hr/day Medical supervision in concert with the patient's rehab needs make it unreasonable for this patient to be served in a less intensive setting? {yes_no_potentially:3041433} Co-Morbidities requiring supervision/potential complications: *** Due to {due VE:9381017}, does the patient require 24 hr/day rehab nursing? {yes_no_potentially:3041433} Does the patient require coordinated care of a physician, rehab nurse, PT, OT, and SLP to address physical and functional deficits in the context of the above medical diagnosis(es)? {yes_no_potentially:3041433} Addressing deficits in the following areas: {deficits:3041436} Can the patient actively participate in an intensive therapy program of at least 3 hrs of therapy 5 days a week? {yes_no_potentially:3041433} The potential for patient to make measurable gains while on inpatient rehab is {potential:3041437} Anticipated functional outcomes upon discharge from inpatient rehab: {functional outcomes:304600100} PT, {functional outcomes:304600100} OT, {functional outcomes:304600100} SLP Estimated rehab length of stay to reach the above functional goals is: *** Anticipated  discharge destination: {anticipated dc setting:21604} 10. Overall Rehab/Functional Prognosis: {potential:3041437}   MD Signature: ***

## 2021-03-15 NOTE — Progress Notes (Addendum)
Inpatient Rehab Admissions:  Inpatient Rehab Consult received.  I met with patient at the bedside for rehabilitation assessment and to discuss goals and expectations of an inpatient rehab admission.  Pt acknowledged understanding of CIR goals and expectations. Pt appeared interested in CIR. Pt gave permission to contact son, [name]. Spoke with [name] on the telephone. He would like to talk over rehab options with the family before making a decision. Will continue to follow.   ADDENDUM: Pt's son called to inform [facility] that he and the family have decided they would like pt to pursue CIR. He confirmed that he and family will be able to provide 24/7 supervision/assistance after discharge.   Signed: Gayland Curry, [place], [place] Admissions Coordinator [phone]

## 2021-03-15 NOTE — Plan of Care (Signed)
°  Problem: Clinical Measurements: Goal: Will remain free from infection Outcome: Progressing   Problem: Nutrition: Goal: Adequate nutrition will be maintained Outcome: Progressing   Problem: Safety: Goal: Ability to remain free from injury will improve Outcome: Progressing   

## 2021-03-15 NOTE — Progress Notes (Signed)
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     Mental Status: She is alert.   Review of Systems  Psychiatric/Behavioral:  Positive for depression, memory loss and suicidal ideas. The patient is nervous/anxious.   All other systems reviewed and are negative.  Blood pressure (!) 152/68, pulse 64, temperature 98.8 F (37.1 C), temperature source Oral, resp. rate 16, height '5\' 3"'  (1.6 m), weight 74.3 kg, SpO2 99 %. Body mass index is 29.02 kg/m.   Treatment Plan  Summary:  Assessment: -MDD -GAD -AMS -seizure disorder  Plan:  Safety Patient is high risk for self-harm.  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   Medications -Continue medications per primary. -Ativan 0.5 mg twice daily as needed for anxiety to prevent benzo withdrawal. -Continue Effexor 37.5 mg twice daily with meals. -Recommend starting mirtazapine 7.5 mg qhs for mood, anxiety, sleep, and appetite   Disposition -Recommend continuing 1:1 safety sitter.  Psychiatry will see patient tomorrow to decide appropriate disposition. -When patient is medically and neurologically cleared, she will likely require inpatient psychiatric admission if she remains at high risk of self harm. If she tries to leave hospital before next psych eval, she will require IVC (pt is currently voluntary)    Thank you for this psych consult.  Psych will continue to follow.    Christoper Allegra, MD 03/15/2021, 3:28 PM  Total Time Spent in Direct Patient Care:  I personally spent 20 minutes on the unit in direct patient care. The direct patient care time included face-to-face time with the patient, reviewing the patient's chart, communicating with other professionals, and coordinating care. Greater than 50% of this time was spent in counseling or coordinating care with the patient regarding goals of hospitalization, psycho-education, and discharge planning needs.   Janine Limbo, MD Psychiatrist

## 2021-03-15 NOTE — Progress Notes (Signed)
PROGRESS NOTE  [name]  IRC:[id number] DOB: 10-Apr-1970 DOA: 03/08/2021 PCP: [name], MD   Brief Narrative:  Patient is a 51 year old female with past medical history of status epilepticus, coronary artery disease, diabetes type 2, CKD, GI bleed, CVA with left-sided weakness, hypothyroidism who presented to [facility] initially with complaints of altered mental status.  On presentation, he was also suspicious for UTI and was started on antibiotics.  CT head did not show any acute intracranial abnormalities.  She was thought to have status epilepticus and was transferred to [facility] for continuous EEG.  PCCM was consulted after admission.  Neurology, PCCM following here.  Transferred to [facility] service on 2/10.  Mental status has significantly improved and she is currently alert and oriented.  PT/OT recommending acute inpatient rehab.  Patient also endorsed suicidal ideation so psychiatry consulted, on safety sitter.   Assessment & Plan:  Active Problems:   Altered mental status   Malnutrition of moderate degree   Urinary tract infection without hematuria   Hypertension associated with diabetes ([place])   Hypertensive emergency    Altered mental status: Metabolic encephalopathy secondary to suspected   non convulsive status epilepticus.   CT head imaging was negative for intracranial abnormalities.  Her mental status has significantly improved and she was alert and oriented x3.MRI showed persistent foci of ill-defined diffusion hyperintensity in the posterior left frontal centrum semiovale probably reflecting subacute to chronic infarcts.No acute or new abnormality  Status epilepticus: Transferred to [facility] for continuous EEG.  Neurology following.  Video EEG showed diffuse slowing with continuous high-voltage spiky discharges indicative of widespread cerebral disturbance with epileptogenic potential.  Concern for subclinical seizures.  Currently on Vimpat, Keppra.  Continuous  EEG stopped on 2/8.Continue seizure precaution  Suicidal ideation: Family stress, concerned about burden on her son. psychiatry consulted.  Currently on Ativan and Effexor.  Psychiatry following today for disposition.  Continue Air cabin crew for now.  She denies any suicidal ideation today and says that her mood is stable.  Dysphagia: Secondary to altered mental status.  Initially started on tube feeding.  SLP following.  Currently on dysphagia 3 diet, tube feeding will be discontinued  UTI: UA was suspicious for urinary tract infection.  Urine cultures showed Enterobacter cloacae, currently on Bactrim.  Denies any dysuria  Diabetes type 2: Monitor blood sugars.  Continue sliding scale insulin.  Hypertension: Remains hypertensive.  On  carvedilol, isosorbide, hydralazine.  Monitor blood pressure.  Continue as needed medications for severe hypertension  AKI on CKD stage IIIb: Baseline creatinine fluctuates from 1.3-1.8.  She follows with Dr. Zollie Scale at [facility].  Creatinine slightly trended up today to the range of 2.  Check BMP tomorrow  Hypothyroidism: On Synthyroid  History of coronary artery disease: No anginal symptoms.  On aspirin, Plavix, statin at home which we will continue  Normocytic anemia: Currently hemoglobin stable in the range of 10.  Has mild thrombocytopenia, continue monitoring CBC  Debility/deconditioning: PT/OT recommending CIR on  discharge.  Son is interested to bring her home with therapy but patient remains interested in rehab versus skilled nursing afcility          Nutrition Problem: Moderate Malnutrition Etiology: chronic illness (gastric bypass)    DVT prophylaxis:heparin injection 5,000 Units Start: 03/08/21 2300 SCDs Start: 03/08/21 2245     Code Status: Full Code  Family Communication:: Discussed with son on phone on 2/10  Patient status:Inpatient   Anticipated discharge to:CIR vs SNF vs Home  health   Consultants: Neurology,  PCCM  Procedures: Video EEG  Antimicrobials:  Anti-infectives (From admission, onward)    Start     Dose/Rate Route Frequency Ordered Stop   03/14/21 2200  sulfamethoxazole-trimethoprim (BACTRIM DS) 800-160 MG per tablet 1 tablet        1 tablet Oral Every 12 hours 03/14/21 1052 03/16/21 0959   03/11/21 1000  sulfamethoxazole-trimethoprim (BACTRIM DS) 800-160 MG per tablet 1 tablet  Status:  Discontinued        1 tablet Per Tube Every 12 hours 03/11/21 0809 03/14/21 1052   03/10/21 2000  piperacillin-tazobactam (ZOSYN) IVPB 3.375 g  Status:  Discontinued        3.375 g 12.5 mL/hr over 240 Minutes Intravenous Every 8 hours 03/10/21 1225 03/11/21 0809   03/10/21 1130  piperacillin-tazobactam (ZOSYN) IVPB 3.375 g        3.375 g 12.5 mL/hr over 240 Minutes Intravenous  Once 03/10/21 1034 03/10/21 1608   03/08/21 2215  cefTRIAXone (ROCEPHIN) 1 g in sodium chloride 0.9 % 100 mL IVPB        1 g 200 mL/hr over 30 Minutes Intravenous  Once 03/08/21 2208 03/08/21 2344       Subjective: Patient seen and examined at the bedside this morning.  Hemodynamically stable today.  Eating her breakfast.  She says that she feels much better today, and her mood is stable.  She denies any suicidal ideation  Objective: Vitals:   03/15/21 0026 03/15/21 0040 03/15/21 0420 03/15/21 0435  BP: (!) 169/82 (!) 148/78 (!) 150/80   Pulse: 72  68 63  Resp: 18   18  Temp: 99.3 F (37.4 C)  99 F (37.2 C) 99 F (37.2 C)  TempSrc: Oral  Oral Oral  SpO2: 97%   100%  Weight:      Height:        Intake/Output Summary (Last 24 hours) at 03/15/2021 0756 Last data filed at 03/15/2021 9381 Gross per 24 hour  Intake 75 ml  Output 1102 ml  Net -1027 ml   Filed Weights   03/10/21 1218 03/11/21 0500 03/14/21 0500  Weight: 73.7 kg 73.7 kg 74.3 kg    Examination:   General exam: Overall comfortable, not in distress, deconditioned, chronically looking HEENT: PERRL Respiratory system:  no wheezes or crackles   Cardiovascular system: S1 & S2 heard, RRR.  Gastrointestinal system: Abdomen is nondistended, soft and nontender. Central nervous system: Alert and oriented Extremities: No edema, no clubbing ,no cyanosis Skin: No rashes, no ulcers,no icterus    Data Reviewed: I have personally reviewed following labs and imaging studies  CBC: Recent Labs  Lab 03/10/21 0946 03/10/21 1122 03/11/21 0451 03/12/21 0441 03/13/21 0738 03/14/21 0118  WBC 11.6*  --  9.7 8.7 9.8 9.6  NEUTROABS 9.3*  --  7.7  --   --   --   HGB 15.4* 14.6 14.8 14.5 11.6* 10.7*  HCT 46.6* 43.0 43.4 42.6 35.6* 31.7*  MCV 82.3  --  80.7 81.8 84.0 83.2  PLT 208  --  193 157 166 829*   Basic Metabolic Panel: Recent Labs  Lab 03/09/21 0228 03/10/21 0946 03/10/21 1744 03/11/21 0451 03/11/21 1555 03/12/21 0441 03/13/21 0738 03/14/21 0118 03/15/21 0227  NA 143   < >  --  136  --  136 135 132* 135  K 4.6   < >  --  4.2  --  4.1 3.9 4.5 5.1  CL 108   < >  --  100  --  100 101 100 101  CO2 23   < >  --  23  --  22 26 24 25   GLUCOSE 154*   < >  --  183*  --  115* 152* 123* 105*  BUN 16   < >  --  33*  --  28* 32* 33* 28*  CREATININE 1.38*   1.45*   < >  --  1.67*  --  1.48* 1.83* 1.94* 2.02*  CALCIUM 9.2   < >  --  9.2  --  8.9 8.3* 8.1* 8.5*  MG 1.6*   < > 2.7* 2.6* 2.5* 2.2 2.5* 2.5*  --   PHOS 4.1  --  5.3* 4.2 4.3  --   --   --   --    < > = values in this interval not displayed.     Recent Results (from the past 240 hour(s))  Resp Panel by RT-PCR (Flu A&B, Covid) Nasopharyngeal Swab     Status: None   Collection Time: 03/08/21  2:25 PM   Specimen: Nasopharyngeal Swab; Nasopharyngeal(NP) swabs in vial transport medium  Result Value Ref Range Status   SARS Coronavirus 2 by RT PCR NEGATIVE NEGATIVE Final    Comment: (NOTE) SARS-CoV-2 target nucleic acids are NOT DETECTED.  The SARS-CoV-2 RNA is generally detectable in upper respiratory specimens during the acute phase of infection. The lowest concentration  of SARS-CoV-2 viral copies this assay can detect is 138 copies/mL. A negative result does not preclude SARS-Cov-2 infection and should not be used as the sole basis for treatment or other patient management decisions. A negative result may occur with  improper specimen collection/handling, submission of specimen other than nasopharyngeal swab, presence of viral mutation(s) within the areas targeted by this assay, and inadequate number of viral copies(<138 copies/mL). A negative result must be combined with clinical observations, patient history, and epidemiological information. The expected result is Negative.  Fact Sheet for Patients:  EntrepreneurPulse.com.au  Fact Sheet for Healthcare Providers:  IncredibleEmployment.be  This test is no t yet approved or cleared by the [place] FDA and  has been authorized for detection and/or diagnosis of SARS-CoV-2 by FDA under an Emergency Use Authorization (EUA). This EUA will remain  in effect (meaning this test can be used) for the duration of the COVID-19 declaration under Section 564(b)(1) of the Act, 21 U.S.C.section 360bbb-3(b)(1), unless the authorization is terminated  or revoked sooner.       Influenza A by PCR NEGATIVE NEGATIVE Final   Influenza B by PCR NEGATIVE NEGATIVE Final    Comment: (NOTE) The Xpert Xpress SARS-CoV-2/FLU/RSV plus assay is intended as an aid in the diagnosis of influenza from Nasopharyngeal swab specimens and should not be used as a sole basis for treatment. Nasal washings and aspirates are unacceptable for Xpert Xpress SARS-CoV-2/FLU/RSV testing.  Fact Sheet for Patients: EntrepreneurPulse.com.au  Fact Sheet for Healthcare Providers: IncredibleEmployment.be  This test is not yet approved or cleared by the [place] FDA and has been authorized for detection and/or diagnosis of SARS-CoV-2 by FDA under an Emergency Use  Authorization (EUA). This EUA will remain in effect (meaning this test can be used) for the duration of the COVID-19 declaration under Section 564(b)(1) of the Act, 21 U.S.C. section 360bbb-3(b)(1), unless the authorization is terminated or revoked.  Performed at [facility], [address] [place], [place] [address]   Urine Culture     Status: Abnormal   Collection Time: 03/08/21  8:45 PM   Specimen: Urine, Clean Catch  Result Value Ref Range Status   Specimen Description URINE, CLEAN CATCH  Final   Special Requests   Final    ADDED 2311 Performed at [facility], [place] [address] [place], [place] [address]    Culture >=100,000 COLONIES/mL ENTEROBACTER CLOACAE (A)  Final   Report Status 03/11/2021 FINAL  Final   Organism ID, Bacteria ENTEROBACTER CLOACAE (A)  Final      Susceptibility   Enterobacter cloacae - MIC*    CEFAZOLIN >=64 RESISTANT Resistant     CEFEPIME 8 INTERMEDIATE Intermediate     CIPROFLOXACIN 1 RESISTANT Resistant     GENTAMICIN <=1 SENSITIVE Sensitive     IMIPENEM 0.5 SENSITIVE Sensitive     NITROFURANTOIN >=512 RESISTANT Resistant     TRIMETH/SULFA <=20 SENSITIVE Sensitive     PIP/TAZO >=128 RESISTANT Resistant     * >=100,000 COLONIES/mL ENTEROBACTER CLOACAE  MRSA Next Gen by PCR, Nasal     Status: None   Collection Time: 03/09/21 12:25 AM   Specimen: Nasal Mucosa; Nasal Swab  Result Value Ref Range Status   MRSA by PCR Next Gen NOT DETECTED NOT DETECTED Final    Comment: (NOTE) The GeneXpert MRSA Assay (FDA approved for NASAL specimens only), is one component of a comprehensive MRSA colonization surveillance program. It is not intended to diagnose MRSA infection nor to guide or monitor treatment for MRSA infections. Test performance is not FDA approved in patients less than 59 years old. Performed at [facility], [place] [address], [place], [place] [address]      Radiology Studies: MR BRAIN WO CONTRAST  Result Date:  03/14/2021 CLINICAL DATA:  Mental status change, persistent or worsening EXAM: MRI HEAD WITHOUT CONTRAST TECHNIQUE: Multiplanar, multiecho pulse sequences of the brain and surrounding structures were obtained without intravenous contrast. COMPARISON:  02/10/2021 FINDINGS: Brain: Persistent small ill-defined foci of diffusion hyperintensity without restriction. No new diffusion abnormality. Ventricles and sulci are stable in size and configuration. Confluent areas of T2 hyperintensity are identified in the supratentorial and pontine white matter, nonspecific but probably reflecting advanced chronic microvascular ischemic changes. Chronic left parietal cortical/subcortical infarct. Chronic small vessel infarcts and prominent perivascular spaces of the central gray nuclei and central white matter as well as chronic infarcts of the pons and cerebellum. Minimal punctate foci of susceptibility likely reflecting chronic microhemorrhages. No mass, mass effect, hydrocephalus, or extra-axial collection. Vascular: Major vessel flow voids at the skull base are preserved. Skull and upper cervical spine: Normal marrow signal is preserved. Sinuses/Orbits: Paranasal sinuses are aerated. No acute orbital abnormality. Other: Sella is unremarkable.  Mastoid air cells are clear. IMPRESSION: Persistent foci of ill-defined diffusion hyperintensity in the posterior left frontal centrum semiovale probably reflecting subacute to chronic infarcts. No acute or new abnormality. Stable chronic findings detailed above. Electronically Signed   By: Macy Mis M.D.   On: 03/14/2021 16:18    Scheduled Meds:  aspirin  81 mg Per Tube Daily   baclofen  5 mg Per Tube BID   carvedilol  12.5 mg Per Tube BID WC   chlorhexidine  15 mL Mouth Rinse BID   Chlorhexidine Gluconate Cloth  6 each Topical Daily   clopidogrel  75 mg Per Tube Daily   gabapentin  100 mg Per Tube Q12H   heparin  5,000 Units Subcutaneous Q8H   hydrALAZINE  25 mg Per Tube  TID   insulin aspart  0-15 Units Subcutaneous Q4H  isosorbide mononitrate  60 mg Oral Daily   lacosamide  200 mg Per Tube BID   levETIRAcetam  1,000 mg Per Tube BID   levothyroxine  25 mcg Per Tube QAC breakfast   mouth rinse  15 mL Mouth Rinse q12n4p   multivitamin with minerals  1 tablet Per Tube BID   vitamin B-6  100 mg Per Tube Daily   rosuvastatin  40 mg Per Tube q1800   sodium chloride flush  10-40 mL Intracatheter Q12H   sulfamethoxazole-trimethoprim  1 tablet Oral Q12H   venlafaxine  37.5 mg Per Tube BID WC   Vitamin D (Ergocalciferol)  50,000 Units Per Tube Q7 days   Continuous Infusions:  lactated ringers Stopped (03/13/21 2028)   promethazine (PHENERGAN) injection (IM or IVPB) Stopped (03/13/21 1016)     LOS: 7 days   Shelly Coss, MD [facility] P2/12/2021, 7:56 AM

## 2021-03-15 NOTE — Progress Notes (Signed)
Subjective: Similar to yesterday  Exam: Vitals:   03/15/21 0435 03/15/21 0802  BP:  (!) 198/77  Pulse: 63 63  Resp: 18 20  Temp: 99 F (37.2 C) 98.7 F (37.1 C)  SpO2: 100% 100%   Gen: In bed, NAD Resp: non-labored breathing, no acute distress Abd: soft, nt  Neuro: MS: Awake, oriented, she is unable to spell world backwards, she is slightly better on trying to give # of quarters in $2.75 today, but still unable to perform it. CN: Pupils equal round and reactive, extraocular movements intact Motor: She moves all extremities well Sensory: Intact light touch  Pertinent Labs: Creatinine 1.94 (GFR 31)  Impression: 51 year old female presenting with encephalopathy in the setting of recent presentation with focal frontal status epilepticus.  From reading the description of her presentation, it seems similar to her presentation for status epilepticus, but the EEG pattern is not definitive.  Previously, she had a clear clinical and electrographic response to Ativan, and though she had a "gray zone" EEG, the lack of clinical or electrographic response to Ativan could be suggestive that this is something else.  She does have an elevated creatinine, and therefore I reduced her keppra and vimpat dose on 2/09.   Given that she is still confused, I will repeat EEG today to assess for improvemetn.   Recommendations: 1)continue Keppra to 1 g twice daily 2) continue Vimpat to 200 twice daily 3) EEG 4) neurology will continue to follow  [name], MD [facility] [phone]  If 7pm- 7am, please page neurology on call as listed in [place].

## 2021-03-15 NOTE — Procedures (Signed)
History: 50 year old female with persistent encephalopathy and history of status epilepticus  Sedation: None  Technique: This EEG was acquired with electrodes placed according to the International 10-20 electrode system (including Fp1, Fp2, F3, F4, C3, C4, P3, P4, O1, O2, T3, T4, T5, T6, A1, A2, Fz, Cz, Pz). The following electrodes were missing or displaced: none.   Background: The background is relatively disorganized with diffuse and generalized irregular theta and delta range activities.  The frontally predominant sharply contoured waves seen previously are no longer evident.  The posterior dominant rhythm of 8 Hz is only seen briefly, and is poorly sustained.  Photic stimulation: Physiologic driving is not performed  EEG Abnormalities: 1) high-voltage generalized irregular slow activity  Clinical Interpretation: This EEG is consistent with a moderate generalized nonspecific cerebral dysfunction (encephalopathy).  Compared to previous studies, there is some improvement.   There was no seizure or seizure predisposition recorded on this study. Please note that lack of epileptiform activity on EEG does not preclude the possibility of epilepsy.   [name], MD [facility] [phone]  If 7pm- 7am, please page neurology on call as listed in [place].

## 2021-03-15 NOTE — Progress Notes (Signed)
EEG done at bedside. No skin breakdown noted. Results pending. 

## 2021-03-16 LAB — BASIC METABOLIC PANEL
Anion gap: 10 (ref 5–15)
BUN: 24 mg/dL — ABNORMAL HIGH (ref 6–20)
CO2: 26 mmol/L (ref 22–32)
Calcium: 8.9 mg/dL (ref 8.9–10.3)
Chloride: 99 mmol/L (ref 98–111)
Creatinine, Ser: 1.86 mg/dL — ABNORMAL HIGH (ref 0.44–1.00)
GFR, Estimated: 32 mL/min — ABNORMAL LOW (ref >60)
Glucose, Bld: 118 mg/dL — ABNORMAL HIGH (ref 70–99)
Potassium: 4.5 mmol/L (ref 3.5–5.1)
Sodium: 135 mmol/L (ref 135–145)

## 2021-03-16 LAB — GLUCOSE, CAPILLARY
Glucose-Capillary: 111 mg/dL — ABNORMAL HIGH (ref 70–99)
Glucose-Capillary: 118 mg/dL — ABNORMAL HIGH (ref 70–99)
Glucose-Capillary: 284 mg/dL — ABNORMAL HIGH (ref 70–99)
Glucose-Capillary: 105 mg/dL — ABNORMAL HIGH (ref 70–99)
Glucose-Capillary: 62 mg/dL — ABNORMAL LOW (ref 70–99)
Glucose-Capillary: 162 mg/dL — ABNORMAL HIGH (ref 70–99)
Glucose-Capillary: 106 mg/dL — ABNORMAL HIGH (ref 70–99)

## 2021-03-16 MED ORDER — ACETAMINOPHEN 325 MG PO TABS
650.0000 mg | ORAL_TABLET | Freq: Four times a day (QID) | ORAL | Status: DC | PRN
  Administered 2021-03-18: 650 mg via ORAL
  Filled 2021-03-16: qty 2

## 2021-03-16 MED ORDER — BACLOFEN 10 MG PO TABS: 5.0000 mg | ORAL_TABLET | Freq: Every day | ORAL | Status: DC

## 2021-03-16 MED ORDER — HYDRALAZINE HCL 50 MG PO TABS
50.0000 mg | ORAL_TABLET | Freq: Three times a day (TID) | ORAL | Status: DC
  Administered 2021-03-16 – 2021-03-17 (×4): 50 mg
  Filled 2021-03-16 (×4): qty 1

## 2021-03-16 NOTE — Progress Notes (Signed)
PROGRESS NOTE  [name]  JXB:[id number] DOB: Jan 04, 1971 DOA: 03/08/2021 PCP: [name], MD   Brief Narrative:  Patient is a 51 year old female with past medical history of status epilepticus, coronary artery disease, diabetes type 2, CKD, GI bleed, CVA with left-sided weakness, hypothyroidism who presented to [facility] initially with complaints of altered mental status.  On presentation, he was also suspicious for UTI and was started on antibiotics.  CT head did not show any acute intracranial abnormalities.  She was thought to have status epilepticus and was transferred to [facility] for continuous EEG.  PCCM was consulted after admission.  Neurology, PCCM following here.  Transferred to [facility] service on 2/10.  Mental status has significantly improved and she is currently alert and oriented.  PT/OT recommending acute inpatient rehab.  Patient also endorsed suicidal ideation so psychiatry consulted, on safety sitter.   Assessment & Plan:  Active Problems:   Altered mental status   Malnutrition of moderate degree   Urinary tract infection without hematuria   Hypertension associated with diabetes ([place])   Hypertensive emergency    Altered mental status: Metabolic encephalopathy secondary to suspected   non convulsive status epilepticus.   CT head imaging was negative for intracranial abnormalities.  Her mental status has significantly improved and she is alert and oriented mostly.MRI showed persistent foci of ill-defined diffusion hyperintensity in the posterior left frontal centrum semiovale probably reflecting subacute to chronic infarcts.No acute or new abnormality  Status epilepticus: Transferred to [facility] for continuous EEG.  Neurology following.  Video EEG showed diffuse slowing with continuous high-voltage spiky discharges indicative of widespread cerebral disturbance with epileptogenic potential.  Concern for subclinical seizures.  Currently on Vimpat, Keppra.   Continuous EEG stopped on 2/8.Continue seizure precaution.repeat EEG did not show seizure activity.  Suicidal ideation: Family stress, concerned about burden on her son. psychiatry consulted.  Currently on Ativan and Effexor.  Psychiatry following today for disposition.  Continue [employer] cabin [employer] for now.    Dysphagia: Secondary to altered mental status.  Initially started on tube feeding.  SLP following.  Currently on dysphagia 3 diet, tube feeding will be discontinued  UTI: UA was suspicious for urinary tract infection.  Urine cultures showed Enterobacter cloacae, treated with  Bactrim.  Denies any dysuria  Diabetes type 2: Monitor blood sugars.  Continue sliding scale insulin.  Hypertension: Remains hypertensive.  On  carvedilol, isosorbide, hydralazine.  Monitor blood pressure.  Continue as needed medications for severe hypertension.We will titrate the medications as needed  AKI on CKD stage IIIb: Baseline creatinine fluctuates from 1.3-1.8.  She follows with Dr. Zollie Scale at [facility].  Kidney function at baseline.  Hypothyroidism: On Synthyroid  History of coronary artery disease: No anginal symptoms.  On aspirin, Plavix, statin at home which we will continue  Normocytic anemia: Currently hemoglobin stable in the range of 10.  Has mild thrombocytopenia, continue monitoring CBC  Debility/deconditioning: PT/OT recommending CIR on  discharge.  Son is interested to bring her home with therapy but patient remains interested in rehab versus skilled nursing facility.  Depends upon psychiatric recommendation upon disposition, she might need inpatient psychiatric admission.          Nutrition Problem: Moderate Malnutrition Etiology: chronic illness (gastric bypass)    DVT prophylaxis:heparin injection 5,000 Units Start: 03/08/21 2300 SCDs Start: 03/08/21 2245     Code Status: Full Code  Family Communication:: Discussed with son on phone on 2/10  Patient status:Inpatient    Anticipated discharge  to:CIR vs SNF vs inpatient psychiatric facility  Consultants: Neurology, PCCM  Procedures: Video EEG  Antimicrobials:  Anti-infectives (From admission, onward)    Start     Dose/Rate Route Frequency Ordered Stop   03/14/21 2200  sulfamethoxazole-trimethoprim (BACTRIM DS) 800-160 MG per tablet 1 tablet        1 tablet Oral Every 12 hours 03/14/21 1052 03/15/21 2040   03/11/21 1000  sulfamethoxazole-trimethoprim (BACTRIM DS) 800-160 MG per tablet 1 tablet  Status:  Discontinued        1 tablet Per Tube Every 12 hours 03/11/21 0809 03/14/21 1052   03/10/21 2000  piperacillin-tazobactam (ZOSYN) IVPB 3.375 g  Status:  Discontinued        3.375 g 12.5 mL/hr over 240 Minutes Intravenous Every 8 hours 03/10/21 1225 03/11/21 0809   03/10/21 1130  piperacillin-tazobactam (ZOSYN) IVPB 3.375 g        3.375 g 12.5 mL/hr over 240 Minutes Intravenous  Once 03/10/21 1034 03/10/21 1608   03/08/21 2215  cefTRIAXone (ROCEPHIN) 1 g in sodium chloride 0.9 % 100 mL IVPB        1 g 200 mL/hr over 30 Minutes Intravenous  Once 03/08/21 2208 03/08/21 2344       Subjective: Patient seen and examined the bedside this morning.  Hemodynamically stable.  Overall looks comfortable.  Sitting in bed.  Eating her breakfast.  She says her mood has improved and she denies any suicidal ideation but still looks tearful and sad.  Alert, mostly oriented.  Complains of some headache today  Objective: Vitals:   03/16/21 0400 03/16/21 0503 03/16/21 0640 03/16/21 0714  BP: (!) 191/91  (!) 180/98 (!) 181/68  Pulse:    74  Resp: 18   18  Temp: 99.3 F (37.4 C)   97.9 F (36.6 C)  TempSrc:    Axillary  SpO2: 96%   98%  Weight:  71.9 kg    Height:        Intake/Output Summary (Last 24 hours) at 03/16/2021 0742 Last data filed at 03/16/2021 0720 Gross per 24 hour  Intake 960 ml  Output 2800 ml  Net -1840 ml   Filed Weights   03/11/21 0500 03/14/21 0500 03/16/21 0503  Weight: 73.7 kg 74.3  kg 71.9 kg    Examination:   General exam: Overall comfortable, not in distress, chronically ill looking HEENT: PERRL Respiratory system:  no wheezes or crackles  Cardiovascular system: S1 & S2 heard, RRR.  Gastrointestinal system: Abdomen is nondistended, soft and nontender. Central nervous system: Alert and oriented Extremities: No edema, no clubbing ,no cyanosis Skin: No rashes, no ulcers,no icterus    Data Reviewed: I have personally reviewed following labs and imaging studies  CBC: Recent Labs  Lab 03/10/21 0946 03/10/21 1122 03/11/21 0451 03/12/21 0441 03/13/21 0738 03/14/21 0118  WBC 11.6*  --  9.7 8.7 9.8 9.6  NEUTROABS 9.3*  --  7.7  --   --   --   HGB 15.4* 14.6 14.8 14.5 11.6* 10.7*  HCT 46.6* 43.0 43.4 42.6 35.6* 31.7*  MCV 82.3  --  80.7 81.8 84.0 83.2  PLT 208  --  193 157 166 785*   Basic Metabolic Panel: Recent Labs  Lab 03/10/21 1744 03/11/21 0451 03/11/21 1555 03/12/21 0441 03/13/21 0738 03/14/21 0118 03/15/21 0227 03/16/21 0424  NA  --  136  --  136 135 132* 135 135  K  --  4.2  --  4.1 3.9 4.5 5.1 4.5  CL  --  100  --  100 101 100 101 99  CO2  --  23  --  22 26 24 25 26   GLUCOSE  --  183*  --  115* 152* 123* 105* 118*  BUN  --  33*  --  28* 32* 33* 28* 24*  CREATININE  --  1.67*  --  1.48* 1.83* 1.94* 2.02* 1.86*  CALCIUM  --  9.2  --  8.9 8.3* 8.1* 8.5* 8.9  MG 2.7* 2.6* 2.5* 2.2 2.5* 2.5*  --   --   PHOS 5.3* 4.2 4.3  --   --   --   --   --      Recent Results (from the past 240 hour(s))  Resp Panel by RT-PCR (Flu A&B, Covid) Nasopharyngeal Swab     Status: None   Collection Time: 03/08/21  2:25 PM   Specimen: Nasopharyngeal Swab; Nasopharyngeal(NP) swabs in vial transport medium  Result Value Ref Range Status   SARS Coronavirus 2 by RT PCR NEGATIVE NEGATIVE Final    Comment: (NOTE) SARS-CoV-2 target nucleic acids are NOT DETECTED.  The SARS-CoV-2 RNA is generally detectable in upper respiratory specimens during the acute phase  of infection. The lowest concentration of SARS-CoV-2 viral copies this assay can detect is 138 copies/mL. A negative result does not preclude SARS-Cov-2 infection and should not be used as the sole basis for treatment or other patient management decisions. A negative result may occur with  improper specimen collection/handling, submission of specimen other than nasopharyngeal swab, presence of viral mutation(s) within the areas targeted by this assay, and inadequate number of viral copies(<138 copies/mL). A negative result must be combined with clinical observations, patient history, and epidemiological information. The expected result is Negative.  Fact Sheet for Patients:  EntrepreneurPulse.com.au  Fact Sheet for Healthcare Providers:  IncredibleEmployment.be  This test is no t yet approved or cleared by the [place] FDA and  has been authorized for detection and/or diagnosis of SARS-CoV-2 by FDA under an Emergency Use Authorization (EUA). This EUA will remain  in effect (meaning this test can be used) for the duration of the COVID-19 declaration under Section 564(b)(1) of the Act, 21 U.S.C.section 360bbb-3(b)(1), unless the authorization is terminated  or revoked sooner.       Influenza A by PCR NEGATIVE NEGATIVE Final   Influenza B by PCR NEGATIVE NEGATIVE Final    Comment: (NOTE) The Xpert Xpress SARS-CoV-2/FLU/RSV plus assay is intended as an aid in the diagnosis of influenza from Nasopharyngeal swab specimens and should not be used as a sole basis for treatment. Nasal washings and aspirates are unacceptable for Xpert Xpress SARS-CoV-2/FLU/RSV testing.  Fact Sheet for Patients: EntrepreneurPulse.com.au  Fact Sheet for Healthcare Providers: IncredibleEmployment.be  This test is not yet approved or cleared by the [place] FDA and has been authorized for detection and/or diagnosis of  SARS-CoV-2 by FDA under an Emergency Use Authorization (EUA). This EUA will remain in effect (meaning this test can be used) for the duration of the COVID-19 declaration under Section 564(b)(1) of the Act, 21 U.S.C. section 360bbb-3(b)(1), unless the authorization is terminated or revoked.  Performed at [facility], [address], [place], [place] [address]   Urine Culture     Status: Abnormal   Collection Time: 03/08/21  8:45 PM   Specimen: Urine, Clean Catch  Result Value Ref Range Status   Specimen Description URINE, CLEAN CATCH  Final   Special Requests   Final  ADDED 2311 Performed at [facility], [place] [address] [place], [place] [address]    Culture >=100,000 COLONIES/mL ENTEROBACTER CLOACAE (A)  Final   Report Status 03/11/2021 FINAL  Final   Organism ID, Bacteria ENTEROBACTER CLOACAE (A)  Final      Susceptibility   Enterobacter cloacae - MIC*    CEFAZOLIN >=64 RESISTANT Resistant     CEFEPIME 8 INTERMEDIATE Intermediate     CIPROFLOXACIN 1 RESISTANT Resistant     GENTAMICIN <=1 SENSITIVE Sensitive     IMIPENEM 0.5 SENSITIVE Sensitive     NITROFURANTOIN >=512 RESISTANT Resistant     TRIMETH/SULFA <=20 SENSITIVE Sensitive     PIP/TAZO >=128 RESISTANT Resistant     * >=100,000 COLONIES/mL ENTEROBACTER CLOACAE  MRSA Next Gen by PCR, Nasal     Status: None   Collection Time: 03/09/21 12:25 AM   Specimen: Nasal Mucosa; Nasal Swab  Result Value Ref Range Status   MRSA by PCR Next Gen NOT DETECTED NOT DETECTED Final    Comment: (NOTE) The GeneXpert MRSA Assay (FDA approved for NASAL specimens only), is one component of a comprehensive MRSA colonization surveillance program. It is not intended to diagnose MRSA infection nor to guide or monitor treatment for MRSA infections. Test performance is not FDA approved in patients less than 38 years old. Performed at [facility], [place] [address] [place], [place] [address]      Radiology Studies: MR  BRAIN WO CONTRAST  Result Date: 03/14/2021 CLINICAL DATA:  Mental status change, persistent or worsening EXAM: MRI HEAD WITHOUT CONTRAST TECHNIQUE: Multiplanar, multiecho pulse sequences of the brain and surrounding structures were obtained without intravenous contrast. COMPARISON:  02/10/2021 FINDINGS: Brain: Persistent small ill-defined foci of diffusion hyperintensity without restriction. No new diffusion abnormality. Ventricles and sulci are stable in size and configuration. Confluent areas of T2 hyperintensity are identified in the supratentorial and pontine white matter, nonspecific but probably reflecting advanced chronic microvascular ischemic changes. Chronic left parietal cortical/subcortical infarct. Chronic small vessel infarcts and prominent perivascular spaces of the central gray nuclei and central white matter as well as chronic infarcts of the pons and cerebellum. Minimal punctate foci of susceptibility likely reflecting chronic microhemorrhages. No mass, mass effect, hydrocephalus, or extra-axial collection. Vascular: Major vessel flow voids at the skull base are preserved. Skull and upper cervical spine: Normal marrow signal is preserved. Sinuses/Orbits: Paranasal sinuses are aerated. No acute orbital abnormality. Other: Sella is unremarkable.  Mastoid air cells are clear. IMPRESSION: Persistent foci of ill-defined diffusion hyperintensity in the posterior left frontal centrum semiovale probably reflecting subacute to chronic infarcts. No acute or new abnormality. Stable chronic findings detailed above. Electronically Signed   By: Macy Mis M.D.   On: 03/14/2021 16:18   EEG adult  Result Date: 03/15/2021 Greta Doom, MD     03/15/2021  3:07 PM History: 51 year old female with persistent encephalopathy and history of status epilepticus Sedation: None Technique: This EEG was acquired with electrodes placed according to the International 10-20 electrode system (including Fp1, Fp2,  F3, F4, C3, C4, P3, P4, O1, O2, T3, T4, T5, T6, A1, A2, Fz, Cz, Pz). The following electrodes were missing or displaced: none. Background: The background is relatively disorganized with diffuse and generalized irregular theta and delta range activities.  The frontally predominant sharply contoured waves seen previously are no longer evident.  The posterior dominant rhythm of 8 Hz is only seen briefly, and is poorly sustained. Photic stimulation: Physiologic driving is not performed EEG  Abnormalities: 1) high-voltage generalized irregular slow activity Clinical Interpretation: This EEG is consistent with a moderate generalized nonspecific cerebral dysfunction (encephalopathy).  Compared to previous studies, there is some improvement. There was no seizure or seizure predisposition recorded on this study. Please note that lack of epileptiform activity on EEG does not preclude the possibility of epilepsy. [name], MD [facility] [phone] If 7pm- 7am, please page neurology on call as listed in [place].    Scheduled Meds:  aspirin  81 mg Per Tube Daily   baclofen  5 mg Per Tube BID   carvedilol  12.5 mg Per Tube BID WC   chlorhexidine  15 mL Mouth Rinse BID   Chlorhexidine Gluconate Cloth  6 each Topical Daily   clopidogrel  75 mg Per Tube Daily   gabapentin  100 mg Per Tube Q12H   heparin  5,000 Units Subcutaneous Q8H   hydrALAZINE  50 mg Per Tube Q8H   insulin aspart  0-15 Units Subcutaneous Q4H   isosorbide mononitrate  120 mg Oral Daily   lacosamide  200 mg Per Tube BID   levETIRAcetam  1,000 mg Per Tube BID   levothyroxine  25 mcg Per Tube QAC breakfast   mouth rinse  15 mL Mouth Rinse q12n4p   multivitamin with minerals  1 tablet Per Tube BID   vitamin B-6  100 mg Per Tube Daily   rosuvastatin  40 mg Per Tube q1800   sodium chloride flush  10-40 mL Intracatheter Q12H   venlafaxine  37.5 mg Per Tube BID WC   Vitamin D (Ergocalciferol)  50,000 Units Per Tube Q7 days    Continuous Infusions:  lactated ringers Stopped (03/13/21 2028)   promethazine (PHENERGAN) injection (IM or IVPB) Stopped (03/13/21 1016)     LOS: 8 days   Shelly Coss, MD [facility] P2/01/2022, 7:42 AM

## 2021-03-16 NOTE — Progress Notes (Signed)
Subjective: Similar to yesterday  Exam: Vitals:   03/16/21 1142 03/16/21 1640  BP: (!) 151/79 (!) 148/69  Pulse: 80 72  Resp: 18 18  Temp: 97.7 F (36.5 C) 97.9 F (36.6 C)  SpO2: 97% 98%   Gen: In bed, NAD Resp: non-labored breathing, no acute distress Abd: soft, nt  Neuro: MS: Awake, oriented, she is unable to spell world backwards, she is unable to give # of quarters in $2.75 CN: Pupils equal round and reactive, extraocular movements intact Motor: She moves all extremities well, but has some asterixis. Sensory: Intact light touch  Pertinent Labs: Creatinine 1.86   Impression: 51 year old female presenting with encephalopathy in the setting of recent presentation with focal frontal status epilepticus.  From reading the description of her presentation, it seems similar to her presentation for status epilepticus, but the EEG pattern is not definitive.  Previously, she had a clear clinical and electrographic response to Ativan, and though she had a "gray zone" EEG, the lack of clinical or electrographic response to Ativan could be suggestive that this is something else.  She does have an elevated creatinine, and therefore I reduced her keppra and vimpat dose on 2/09.    Recommendations: 1) continue Keppra 1 g twice daily 2) continue Vimpat 200 twice daily 3) decrease baclofen to 5mg  qhs x 3 days, then d/c 4) pharmacy consult to check if any of her other meds can cause asterixis.  5) recheck ammonia 6) will follow.   [name], MD [facility] [phone]  If 7pm- 7am, please page neurology on call as listed in [place].

## 2021-03-17 LAB — BASIC METABOLIC PANEL
Anion gap: 10 (ref 5–15)
BUN: 25 mg/dL — ABNORMAL HIGH (ref 6–20)
CO2: 26 mmol/L (ref 22–32)
Calcium: 8.8 mg/dL — ABNORMAL LOW (ref 8.9–10.3)
Chloride: 100 mmol/L (ref 98–111)
Creatinine, Ser: 2.16 mg/dL — ABNORMAL HIGH (ref 0.44–1.00)
GFR, Estimated: 27 mL/min — ABNORMAL LOW (ref >60)
Glucose, Bld: 131 mg/dL — ABNORMAL HIGH (ref 70–99)
Potassium: 4.4 mmol/L (ref 3.5–5.1)
Sodium: 136 mmol/L (ref 135–145)

## 2021-03-17 LAB — AMMONIA: Ammonia: 15 umol/L (ref 9–35)

## 2021-03-17 LAB — GLUCOSE, CAPILLARY
Glucose-Capillary: 112 mg/dL — ABNORMAL HIGH (ref 70–99)
Glucose-Capillary: 164 mg/dL — ABNORMAL HIGH (ref 70–99)
Glucose-Capillary: 167 mg/dL — ABNORMAL HIGH (ref 70–99)
Glucose-Capillary: 222 mg/dL — ABNORMAL HIGH (ref 70–99)
Glucose-Capillary: 122 mg/dL — ABNORMAL HIGH (ref 70–99)

## 2021-03-17 LAB — LEVETIRACETAM LEVEL: Levetiracetam Lvl: 52.2 ug/mL — ABNORMAL HIGH (ref 10.0–40.0)

## 2021-03-17 MED ORDER — ADULT MULTIVITAMIN W/MINERALS CH
1.0000 | ORAL_TABLET | Freq: Two times a day (BID) | ORAL | Status: DC
  Administered 2021-03-17 – 2021-03-20 (×4): 1 via ORAL
  Filled 2021-03-17 (×6): qty 1

## 2021-03-17 MED ORDER — BACLOFEN 10 MG PO TABS
5.0000 mg | ORAL_TABLET | Freq: Every day | ORAL | Status: AC
  Administered 2021-03-17 – 2021-03-19 (×3): 5 mg via ORAL
  Filled 2021-03-17 (×3): qty 1

## 2021-03-17 MED ORDER — SODIUM CHLORIDE 0.9 % IV SOLN: INTRAVENOUS | Status: DC

## 2021-03-17 MED ORDER — DOCUSATE SODIUM 100 MG PO CAPS: 100.0000 mg | ORAL_CAPSULE | Freq: Two times a day (BID) | ORAL | Status: DC | PRN

## 2021-03-17 MED ORDER — ASPIRIN 81 MG PO CHEW
81.0000 mg | CHEWABLE_TABLET | Freq: Every day | ORAL | Status: DC
  Administered 2021-03-18 – 2021-03-20 (×3): 81 mg via ORAL
  Filled 2021-03-17 (×3): qty 1

## 2021-03-17 MED ORDER — CARVEDILOL 12.5 MG PO TABS
12.5000 mg | ORAL_TABLET | Freq: Two times a day (BID) | ORAL | Status: DC
  Administered 2021-03-17 – 2021-03-20 (×6): 12.5 mg via ORAL
  Filled 2021-03-17 (×6): qty 1

## 2021-03-17 MED ORDER — LACOSAMIDE 200 MG PO TABS
200.0000 mg | ORAL_TABLET | Freq: Two times a day (BID) | ORAL | Status: DC
  Administered 2021-03-17 – 2021-03-20 (×6): 200 mg via ORAL
  Filled 2021-03-17 (×6): qty 1

## 2021-03-17 MED ORDER — VENLAFAXINE HCL 37.5 MG PO TABS
37.5000 mg | ORAL_TABLET | Freq: Two times a day (BID) | ORAL | Status: DC
  Administered 2021-03-17 – 2021-03-20 (×6): 37.5 mg via ORAL
  Filled 2021-03-17 (×6): qty 1

## 2021-03-17 MED ORDER — HYDRALAZINE HCL 50 MG PO TABS
100.0000 mg | ORAL_TABLET | Freq: Three times a day (TID) | ORAL | Status: DC
  Administered 2021-03-17 – 2021-03-20 (×9): 100 mg via ORAL
  Filled 2021-03-17 (×9): qty 2

## 2021-03-17 MED ORDER — VITAMIN D (ERGOCALCIFEROL) 1.25 MG (50000 UNIT) PO CAPS
50000.0000 [IU] | ORAL_CAPSULE | ORAL | Status: DC
  Filled 2021-03-17: qty 1

## 2021-03-17 MED ORDER — VITAMIN B-6 100 MG PO TABS
100.0000 mg | ORAL_TABLET | Freq: Every day | ORAL | Status: DC
  Administered 2021-03-18 – 2021-03-20 (×3): 100 mg via ORAL
  Filled 2021-03-17 (×3): qty 1

## 2021-03-17 MED ORDER — POLYETHYLENE GLYCOL 3350 17 G PO PACK: 17.0000 g | PACK | Freq: Every day | ORAL | Status: DC | PRN

## 2021-03-17 MED ORDER — LEVOTHYROXINE SODIUM 25 MCG PO TABS
25.0000 ug | ORAL_TABLET | Freq: Every day | ORAL | Status: DC
  Administered 2021-03-18 – 2021-03-20 (×3): 25 ug via ORAL
  Filled 2021-03-17 (×3): qty 1

## 2021-03-17 MED ORDER — ROSUVASTATIN CALCIUM 20 MG PO TABS
40.0000 mg | ORAL_TABLET | Freq: Every day | ORAL | Status: DC
  Administered 2021-03-17 – 2021-03-19 (×3): 40 mg via ORAL
  Filled 2021-03-17 (×3): qty 2

## 2021-03-17 MED ORDER — MIRTAZAPINE 15 MG PO TABS
7.5000 mg | ORAL_TABLET | Freq: Every day | ORAL | Status: DC
  Administered 2021-03-17 – 2021-03-19 (×3): 7.5 mg via ORAL
  Filled 2021-03-17 (×3): qty 1

## 2021-03-17 MED ORDER — LEVETIRACETAM 100 MG/ML PO SOLN
1000.0000 mg | Freq: Two times a day (BID) | ORAL | Status: DC
  Administered 2021-03-17 – 2021-03-20 (×6): 1000 mg via ORAL
  Filled 2021-03-17 (×6): qty 10

## 2021-03-17 MED ORDER — CLOPIDOGREL BISULFATE 75 MG PO TABS
75.0000 mg | ORAL_TABLET | Freq: Every day | ORAL | Status: DC
  Administered 2021-03-18 – 2021-03-20 (×3): 75 mg via ORAL
  Filled 2021-03-17 (×3): qty 1

## 2021-03-17 MED ORDER — HYDRALAZINE HCL 50 MG PO TABS: 100.0000 mg | ORAL_TABLET | Freq: Three times a day (TID) | ORAL | Status: DC

## 2021-03-17 MED ORDER — GABAPENTIN 100 MG PO CAPS
100.0000 mg | ORAL_CAPSULE | Freq: Two times a day (BID) | ORAL | Status: DC
  Administered 2021-03-17 – 2021-03-20 (×6): 100 mg via ORAL
  Filled 2021-03-17 (×6): qty 1

## 2021-03-17 NOTE — Progress Notes (Signed)
SLP Contact Note  Patient Details Name: [name] MRN: [id number] DOB: Jun 16, 1970   Cancelled treatment:        [name] to see pt for ongoing dysphagia management.  Pt now with dentures present.  Offered trials of regular solids for possible diet texture advancement.  Pt states she is content to remain on mechanical soft/D3 diet.  SLP will sign off at this time.  Should pt want to advance diet texture while in house, please re-consult speech therapy for re-assessment.   Celedonio Savage, MA, [place] Office: [phone] 03/17/2021, 10:58 AM

## 2021-03-17 NOTE — Progress Notes (Signed)
Subjective: Interval History: Doing better. She feels back to baseline except for leg weakness.  Objective: Vital signs in last 24 hours: Temp:  [97.9 F (36.6 C)-99.6 F (37.6 C)] 99.6 F (37.6 C) (02/13 1217) Pulse Rate:  [69-88] 70 (02/13 1217) Resp:  [16-19] 18 (02/13 1217) BP: (148-214)/(69-89) 155/81 (02/13 1217) SpO2:  [96 %-100 %] 99 % (02/13 1217)  Intake/Output from previous day: 02/12 0701 - 02/13 0700 In: 1920 [P.O.:1920] Out: 2300 [Urine:2300] Intake/Output this shift: Total I/O In: -  Out: 250 [Urine:250] Nutritional status:  Diet Order             DIET DYS 3 Room service appropriate? Yes; Fluid consistency: Thin  Diet effective now                  GEN: NAD. Sitter at bedside. Awake, calm. Neuro:alert oriented to name, place, year, month, day of week. Knew president and past two presidents. Knew her condition and why she was here. Feels back to baseline.  CN: 2-12: PERRL, EMOI. No field cut. Face symmetric. Motor: non focal. Gen leg weakness.  Cerebellar: no ataxia. Sensory: intact to LT.  Reflexes: normal 1-2+ Gait: deferred.    Lab Results: Recent Labs    03/16/21 0424 03/17/21 0707  NA 135 136  K 4.5 4.4  CL 99 100  CO2 26 26  GLUCOSE 118* 131*  BUN 24* 25*  CREATININE 1.86* 2.16*  CALCIUM 8.9 8.8*   Lipid Panel No results for input(s): CHOL, TRIG, HDL, CHOLHDL, VLDL, LDLCALC in the last 72 hours.  Studies/Results: EEG adult  Result Date: 03/15/2021 Greta Doom, MD     03/15/2021  3:07 PM History: 51 year old female with persistent encephalopathy and history of status epilepticus Sedation: None Technique: This EEG was acquired with electrodes placed according to the International 10-20 electrode system (including Fp1, Fp2, F3, F4, C3, C4, P3, P4, O1, O2, T3, T4, T5, T6, A1, A2, Fz, Cz, Pz). The following electrodes were missing or displaced: none. Background: The background is relatively disorganized with diffuse and  generalized irregular theta and delta range activities.  The frontally predominant sharply contoured waves seen previously are no longer evident.  The posterior dominant rhythm of 8 Hz is only seen briefly, and is poorly sustained. Photic stimulation: Physiologic driving is not performed EEG Abnormalities: 1) high-voltage generalized irregular slow activity Clinical Interpretation: This EEG is consistent with a moderate generalized nonspecific cerebral dysfunction (encephalopathy).  Compared to previous studies, there is some improvement. There was no seizure or seizure predisposition recorded on this study. Please note that lack of epileptiform activity on EEG does not preclude the possibility of epilepsy. [name], MD [facility] [phone] If 7pm- 7am, please page neurology on call as listed in [place].    Medications: Scheduled:  [START ON 03/18/2021] aspirin  81 mg Oral Daily   baclofen  5 mg Oral QHS   carvedilol  12.5 mg Oral BID WC   chlorhexidine  15 mL Mouth Rinse BID   Chlorhexidine Gluconate Cloth  6 each Topical Daily   [START ON 03/18/2021] clopidogrel  75 mg Oral Daily   gabapentin  100 mg Oral BID   heparin  5,000 Units Subcutaneous Q8H   hydrALAZINE  100 mg Oral Q8H   insulin aspart  0-15 Units Subcutaneous Q4H   isosorbide mononitrate  120 mg Oral Daily   lacosamide  200 mg Oral BID   levETIRAcetam  1,000 mg Oral BID   [START ON 03/18/2021] levothyroxine  25 mcg Oral QAC breakfast   mouth rinse  15 mL Mouth Rinse q12n4p   mirtazapine  7.5 mg Oral QHS   multivitamin with minerals  1 tablet Oral BID   [START ON 03/18/2021] vitamin B-6  100 mg Oral Daily   rosuvastatin  40 mg Oral q1800   sodium chloride flush  10-40 mL Intracatheter Q12H   venlafaxine  37.5 mg Oral BID WC   [START ON 03/20/2021] Vitamin D (Ergocalciferol)  50,000 Units Oral Q7 days   Continuous:  sodium chloride 75 mL/hr at 03/17/21 1248   promethazine (PHENERGAN) injection (IM or IVPB)  Stopped (03/13/21 1016)   POI:[id number], docusate sodium, hydrALAZINE, labetalol, LORazepam, ondansetron (ZOFRAN) IV, polyethylene glycol, promethazine (PHENERGAN) injection (IM or IVPB), sodium chloride flush  Assessment/Plan: Impression: 51 year old female presenting with encephalopathy in the setting of recent presentation with focal frontal status epilepticus.   she had a clear clinical and electrographic response to Ativan, keppra and vimpat dose reduced on 2/09.  MS improving.      Recommendations: 1) continue Keppra 1 g twice daily 2) continue Vimpat 200 twice daily 3) titrate down baclofen to 5mg  qhs x 2 days, then d/c and follow Cr labs. 4) Will sign off. Call with questions.   LOS: 9 days   Massie Cogliano M Emmali Karow   Total of 35 mins spent reviewing chart, discussion with patient and family on prognosis, Dx and plan. Discussed case with patient's nurse. Reviewed Imaging personally.

## 2021-03-17 NOTE — Consult Note (Signed)
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 at 03/13/21 1016   pyridOXINE (VITAMIN B-6) tablet 100 mg  100 mg Per Tube Daily Maryjane Hurter, MD   100 mg at 03/17/21 1011   rosuvastatin (CRESTOR) tablet 40 mg  40 mg Per Tube q1800 Pierce, Dwayne A, RPH   40 mg at 03/16/21 1647   sodium chloride flush (NS) 0.9 % injection 10-40 mL  10-40 mL Intracatheter Q12H Maryjane Hurter, MD   10 mL at 03/17/21 1012   sodium chloride flush (NS) 0.9 % injection 10-40 mL  10-40 mL Intracatheter PRN Maryjane Hurter, MD       venlafaxine [facility]) tablet 37.5 mg  37.5 mg Per Tube BID WC Maryjane Hurter, MD   37.5 mg at 03/17/21 0815   Vitamin D (Ergocalciferol) (DRISDOL) capsule 50,000 Units  50,000 Units Per Tube Q7 days Maryjane Hurter, MD   50,000 Units at 03/13/21 1650    Musculoskeletal: Strength & Muscle Tone:  Not tested Gait & Station:  Deferred Patient leans: N/A            Psychiatric Specialty  Exam:  Presentation  General Appearance: Casual; Appropriate for Environment  Eye Contact:Good  Speech:Normal Rate  Speech Volume:Normal Handedness:No data recorded  Mood and Affect  Mood:-- ("Good")  Affect:Congruent; Constricted   Thought Process  Thought Processes:Linear  Descriptions of Associations:Intact  Orientation:Full (Time, Place and Person)  Thought Content:Logical  History of Schizophrenia/Schizoaffective  disorder:No data recorded Duration of Psychotic Symptoms:No data recorded Hallucinations:Hallucinations: None  Ideas of Reference:None  Suicidal Thoughts:Suicidal Thoughts: No  Homicidal Thoughts:Homicidal Thoughts: No   Sensorium  Memory:Remote Fair; Immediate Fair; Recent Poor  Judgment:Fair  Insight:Fair   Executive Functions  Concentration:Good  Attention Span:Good  [place]  Language:Good   Psychomotor Activity  Psychomotor Activity:Psychomotor Activity: Normal   Assets  Assets:Social Support; Housing; Armed forces logistics/support/administrative officer; Desire for Improvement   Sleep  Sleep:Sleep: Poor   Physical Exam: Physical Exam Review of Systems  Constitutional:        Poor appetite  Psychiatric/Behavioral:  Positive for depression. Negative for hallucinations and suicidal ideas. The patient has insomnia. The patient is not nervous/anxious.   Blood pressure (!) 155/81, pulse 70, temperature 99.6 F (37.6 C), temperature source Oral, resp. rate 18, height '5\' 3"'  (1.6 m), weight 71.9 kg, SpO2 99 %. Body mass index is 28.08 kg/m.  Armando Reichert, MD 03/17/2021 12:41 PM

## 2021-03-17 NOTE — Progress Notes (Signed)
PROGRESS NOTE  [name]  QQP:[id number] DOB: 11/14/1970 DOA: 03/08/2021 PCP: [name], MD   Brief Narrative:  Patient is a 51 year old female with past medical history of status epilepticus, coronary artery disease, diabetes type 2, CKD, GI bleed, CVA with left-sided weakness, hypothyroidism who presented to [facility] initially with complaints of altered mental status.  On presentation, he was also suspicious for UTI and was started on antibiotics.  CT head did not show any acute intracranial abnormalities.  She was thought to have status epilepticus and was transferred to [facility] for continuous EEG.  PCCM was consulted after admission.  Neurology, PCCM were following here.  Transferred to [facility] service on 2/10.  Mental status has significantly improved and she is currently alert and oriented.  PT/OT recommending acute inpatient rehab.  Patient also endorsed suicidal ideation so psychiatry consulted, on safety sitter but apparently has been cleared by psychiarty.  Waiting for placement   Assessment & Plan:  Active Problems:   Altered mental status   Malnutrition of moderate degree   Urinary tract infection without hematuria   Hypertension associated with diabetes ([place])   Hypertensive emergency    Altered mental status: Metabolic encephalopathy secondary to suspected   non convulsive status epilepticus.   CT head imaging was negative for intracranial abnormalities.  Her mental status has significantly improved and she is alert and oriented mostly.MRI showed persistent foci of ill-defined diffusion hyperintensity in the posterior left frontal centrum semiovale probably reflecting subacute to chronic infarcts.No acute or new abnormality  Status epilepticus: Transferred to [facility] for continuous EEG.  Neurology following.  Video EEG showed diffuse slowing with continuous high-voltage spiky discharges indicative of widespread cerebral disturbance with epileptogenic potential.   Concern for subclinical seizures.  Currently on Vimpat, Keppra.  Continuous EEG stopped on 2/8.Continue seizure precaution.repeat EEG did not show seizure activity.  Suicidal ideation: Family stress, concerned about burden on her son. psychiatry consulted.  Currently on Ativan and Effexor.  Psychiatry was following, however suicidal ideations/thoughts have significantly improved and she currently denies.  We will continue safety sitter for 1 more day and discontinue  tomorrow  Dysphagia: Secondary to altered mental status.  Initially started on tube feeding.  SLP following.  Currently on dysphagia 3 diet  UTI: UA was suspicious for urinary tract infection.  Urine cultures showed Enterobacter cloacae, treated with  Bactrim.  Denies any dysuria  Diabetes type 2: Monitor blood sugars.  Continue sliding scale insulin.  Hypertension: Remains hypertensive.  On  carvedilol, isosorbide, hydralazine.  Monitor blood pressure.  Continue as needed medications for severe hypertension.We will titrate the medications as needed  AKI on CKD stage IIIb: Baseline creatinine fluctuates from 1.3-1.8.  She follows with Dr. Zollie [facility] [place] [facility].  Kidney function worsened today so will start on IV fluids  Hypothyroidism: On Synthyroid  History of coronary artery disease: No anginal symptoms.  On aspirin, Plavix, statin at home which we will continue  Normocytic anemia: Currently hemoglobin stable in the range of 10.  Has mild thrombocytopenia, continue monitoring CBC  Debility/deconditioning: PT/OT recommending CIR on  discharge.  Son is interested to bring her home with therapy but patient remains interested in rehab versus skilled nursing facility.        Nutrition Problem: Moderate Malnutrition Etiology: chronic illness (gastric bypass)    DVT prophylaxis:heparin injection 5,000 Units Start: 03/08/21 2300 SCDs Start: 03/08/21 2245     Code Status: Full Code  Family Communication::  Discussed with  son on phone on 2/12  Patient status:Inpatient   Anticipated discharge to:CIR vs SNF  Consultants: Neurology, PCCM  Procedures: Video EEG  Antimicrobials:  Anti-infectives (From admission, onward)    Start     Dose/Rate Route Frequency Ordered Stop   03/14/21 2200  sulfamethoxazole-trimethoprim (BACTRIM DS) 800-160 MG per tablet 1 tablet        1 tablet Oral Every 12 hours 03/14/21 1052 03/15/21 2040   03/11/21 1000  sulfamethoxazole-trimethoprim (BACTRIM DS) 800-160 MG per tablet 1 tablet  Status:  Discontinued        1 tablet Per Tube Every 12 hours 03/11/21 0809 03/14/21 1052   03/10/21 2000  piperacillin-tazobactam (ZOSYN) IVPB 3.375 g  Status:  Discontinued        3.375 g 12.5 mL/hr over 240 Minutes Intravenous Every 8 hours 03/10/21 1225 03/11/21 0809   03/10/21 1130  piperacillin-tazobactam (ZOSYN) IVPB 3.375 g        3.375 g 12.5 mL/hr over 240 Minutes Intravenous  Once 03/10/21 1034 03/10/21 1608   03/08/21 2215  cefTRIAXone (ROCEPHIN) 1 g in sodium chloride 0.9 % 100 mL IVPB        1 g 200 mL/hr over 30 Minutes Intravenous  Once 03/08/21 2208 03/08/21 2344       Subjective: Patient seen and examined at the bedside this morning.  Hemodynamically stable.  Overall comfortable.  Alert and oriented.  Denies any complaints today  Objective: Vitals:   03/16/21 2316 03/17/21 0358 03/17/21 0519 03/17/21 0730  BP: (!) 175/82 (!) 214/78 (!) 157/89 (!) 187/72  Pulse: 82 88  71  Resp: 17 16 16 19   Temp: 99.2 F (37.3 C) 98.9 F (37.2 C)  98.5 F (36.9 C)  TempSrc: Oral   Oral  SpO2: 100% 98%  97%  Weight:      Height:        Intake/Output Summary (Last 24 hours) at 03/17/2021 1112 Last data filed at 03/17/2021 0659 Gross per 24 hour  Intake 1440 ml  Output 1850 ml  Net -410 ml   Filed Weights   03/11/21 0500 03/14/21 0500 03/16/21 0503  Weight: 73.7 kg 74.3 kg 71.9 kg    Examination:   General exam: Overall comfortable, not in  distress HEENT: PERRL Respiratory system:  no wheezes or crackles  Cardiovascular system: S1 & S2 heard, RRR.  Gastrointestinal system: Abdomen is nondistended, soft and nontender. Central nervous system: Alert and oriented Extremities: No edema, no clubbing ,no cyanosis Skin: No rashes, no ulcers,no icterus    Data Reviewed: I have personally reviewed following labs and imaging studies  CBC: Recent Labs  Lab 03/10/21 1122 03/11/21 0451 03/12/21 0441 03/13/21 0738 03/14/21 0118  WBC  --  9.7 8.7 9.8 9.6  NEUTROABS  --  7.7  --   --   --   HGB 14.6 14.8 14.5 11.6* 10.7*  HCT 43.0 43.4 42.6 35.6* 31.7*  MCV  --  80.7 81.8 84.0 83.2  PLT  --  193 157 166 956*   Basic Metabolic Panel: Recent Labs  Lab 03/10/21 1744 03/10/21 1744 03/11/21 0451 03/11/21 1555 03/12/21 0441 03/13/21 0738 03/14/21 0118 03/15/21 0227 03/16/21 0424 03/17/21 0707  NA  --   --  136  --  136 135 132* 135 135 136  K  --   --  4.2  --  4.1 3.9 4.5 5.1 4.5 4.4  CL  --    < > 100  --  100 101 100 101  99 100  CO2  --    < > 23  --  22 26 24 25 26 26   GLUCOSE  --    < > 183*  --  115* 152* 123* 105* 118* 131*  BUN  --    < > 33*  --  28* 32* 33* 28* 24* 25*  CREATININE  --    < > 1.67*  --  1.48* 1.83* 1.94* 2.02* 1.86* 2.16*  CALCIUM  --    < > 9.2  --  8.9 8.3* 8.1* 8.5* 8.9 8.8*  MG 2.7*  --  2.6* 2.5* 2.2 2.5* 2.5*  --   --   --   PHOS 5.3*  --  4.2 4.3  --   --   --   --   --   --    < > = values in this interval not displayed.     Recent Results (from the past 240 hour(s))  Resp Panel by RT-PCR (Flu A&B, Covid) Nasopharyngeal Swab     Status: None   Collection Time: 03/08/21  2:25 PM   Specimen: Nasopharyngeal Swab; Nasopharyngeal(NP) swabs in vial transport medium  Result Value Ref Range Status   SARS Coronavirus 2 by RT PCR NEGATIVE NEGATIVE Final    Comment: (NOTE) SARS-CoV-2 target nucleic acids are NOT DETECTED.  The SARS-CoV-2 RNA is generally detectable in upper  respiratory specimens during the acute phase of infection. The lowest concentration of SARS-CoV-2 viral copies this assay can detect is 138 copies/mL. A negative result does not preclude SARS-Cov-2 infection and should not be used as the sole basis for treatment or other patient management decisions. A negative result may occur with  improper specimen collection/handling, submission of specimen other than nasopharyngeal swab, presence of viral mutation(s) within the areas targeted by this assay, and inadequate number of viral copies(<138 copies/mL). A negative result must be combined with clinical observations, patient history, and epidemiological information. The expected result is Negative.  Fact Sheet for Patients:  EntrepreneurPulse.com.au  Fact Sheet for Healthcare Providers:  IncredibleEmployment.be  This test is no t yet approved or cleared by the [place] FDA and  has been authorized for detection and/or diagnosis of SARS-CoV-2 by FDA under an Emergency Use Authorization (EUA). This EUA will remain  in effect (meaning this test can be used) for the duration of the COVID-19 declaration under Section 564(b)(1) of the Act, 21 U.S.C.section 360bbb-3(b)(1), unless the authorization is terminated  or revoked sooner.       Influenza A by PCR NEGATIVE NEGATIVE Final   Influenza B by PCR NEGATIVE NEGATIVE Final    Comment: (NOTE) The Xpert Xpress SARS-CoV-2/FLU/RSV plus assay is intended as an aid in the diagnosis of influenza from Nasopharyngeal swab specimens and should not be used as a sole basis for treatment. Nasal washings and aspirates are unacceptable for Xpert Xpress SARS-CoV-2/FLU/RSV testing.  Fact Sheet for Patients: EntrepreneurPulse.com.au  Fact Sheet for Healthcare Providers: IncredibleEmployment.be  This test is not yet approved or cleared by the [place] FDA and has been  authorized for detection and/or diagnosis of SARS-CoV-2 by FDA under an Emergency Use Authorization (EUA). This EUA will remain in effect (meaning this test can be used) for the duration of the COVID-19 declaration under Section 564(b)(1) of the Act, 21 U.S.C. section 360bbb-3(b)(1), unless the authorization is terminated or revoked.  Performed at [facility], [address] [place], [place] [address]   Urine Culture     Status: Abnormal  Collection Time: 03/08/21  8:45 PM   Specimen: Urine, Clean Catch  Result Value Ref Range Status   Specimen Description URINE, CLEAN CATCH  Final   Special Requests   Final    ADDED 2311 Performed at [facility], [place] [address], [place], [place] [address]    Culture >=100,000 COLONIES/mL ENTEROBACTER CLOACAE (A)  Final   Report Status 03/11/2021 FINAL  Final   Organism ID, Bacteria ENTEROBACTER CLOACAE (A)  Final      Susceptibility   Enterobacter cloacae - MIC*    CEFAZOLIN >=64 RESISTANT Resistant     CEFEPIME 8 INTERMEDIATE Intermediate     CIPROFLOXACIN 1 RESISTANT Resistant     GENTAMICIN <=1 SENSITIVE Sensitive     IMIPENEM 0.5 SENSITIVE Sensitive     NITROFURANTOIN >=512 RESISTANT Resistant     TRIMETH/SULFA <=20 SENSITIVE Sensitive     PIP/TAZO >=128 RESISTANT Resistant     * >=100,000 COLONIES/mL ENTEROBACTER CLOACAE  MRSA Next Gen by PCR, Nasal     Status: None   Collection Time: 03/09/21 12:25 AM   Specimen: Nasal Mucosa; Nasal Swab  Result Value Ref Range Status   MRSA by PCR Next Gen NOT DETECTED NOT DETECTED Final    Comment: (NOTE) The GeneXpert MRSA Assay (FDA approved for NASAL specimens only), is one component of a comprehensive MRSA colonization surveillance program. It is not intended to diagnose MRSA infection nor to guide or monitor treatment for MRSA infections. Test performance is not FDA approved in patients less than 59 years old. Performed at [facility], [place] [address],  [place], [place] [address]      Radiology Studies: EEG adult  Result Date: 03-23-2021 Greta Doom, MD     March 23, 2021  3:07 PM History: 51 year old female with persistent encephalopathy and history of status epilepticus Sedation: None Technique: This EEG was acquired with electrodes placed according to the International 10-20 electrode system (including Fp1, Fp2, F3, F4, C3, C4, P3, P4, O1, O2, T3, T4, T5, T6, A1, A2, Fz, Cz, Pz). The following electrodes were missing or displaced: none. Background: The background is relatively disorganized with diffuse and generalized irregular theta and delta range activities.  The frontally predominant sharply contoured waves seen previously are no longer evident.  The posterior dominant rhythm of 8 Hz is only seen briefly, and is poorly sustained. Photic stimulation: Physiologic driving is not performed EEG Abnormalities: 1) high-voltage generalized irregular slow activity Clinical Interpretation: This EEG is consistent with a moderate generalized nonspecific cerebral dysfunction (encephalopathy).  Compared to previous studies, there is some improvement. There was no seizure or seizure predisposition recorded on this study. Please note that lack of epileptiform activity on EEG does not preclude the possibility of epilepsy. [name], MD [facility] [phone] If 7pm- 7am, please page neurology on call as listed in [place].    Scheduled Meds:  aspirin  81 mg Per Tube Daily   baclofen  5 mg Per Tube QHS   carvedilol  12.5 mg Per Tube BID WC   chlorhexidine  15 mL Mouth Rinse BID   Chlorhexidine Gluconate Cloth  6 each Topical Daily   clopidogrel  75 mg Per Tube Daily   gabapentin  100 mg Per Tube Q12H   heparin  5,000 Units Subcutaneous Q8H   hydrALAZINE  100 mg Per Tube Q8H   insulin aspart  0-15 Units Subcutaneous Q4H   isosorbide mononitrate  120 mg Oral Daily   lacosamide  200 mg Per Tube  BID   levETIRAcetam  1,000 mg Per Tube  BID   levothyroxine  25 mcg Per Tube QAC breakfast   mouth rinse  15 mL Mouth Rinse q12n4p   mirtazapine  7.5 mg Oral QHS   multivitamin with minerals  1 tablet Per Tube BID   vitamin B-6  100 mg Per Tube Daily   rosuvastatin  40 mg Per Tube q1800   sodium chloride flush  10-40 mL Intracatheter Q12H   venlafaxine  37.5 mg Per Tube BID WC   Vitamin D (Ergocalciferol)  50,000 Units Per Tube Q7 days   Continuous Infusions:  sodium chloride     promethazine (PHENERGAN) injection (IM or IVPB) Stopped (03/13/21 1016)     LOS: 9 days   Shelly Coss, MD [facility] P2/13/2023, 11:12 AM

## 2021-03-17 NOTE — Progress Notes (Signed)
Physical Therapy Treatment Patient Details Name: [name] MRN: [id number] DOB: [birth year] Today's Date: 03/17/2021   History of Present Illness 51 y.o. female, who presented to the [facility] ED as a transfer from [facility] ED with a chief complaint of AMS. She has a pertinent past medical history of subclinical status epilepticus, CAD, DM2, CKD, GIB, CVA with left sided weakness, hypothyroidism.    PT Comments    Focus of session today bed mobility, functional transfers, sitting, and standing balance. The patient tolerated well but was self limiting throughout the session.  Pt. Shows overall improvement with bed mobility and decreased assistance for transfers and standing. Movement anxiety, overall LE stregnth, decreased weight shifting, and limited stepping ability are still limiting function. Pt. Would benefit from skilled PT to continue to address these deficits, overall endurance, and functional gait ability. Plan and discharge setting remains unchanged. Pt to follow acutely as appropriate.     Recommendations for follow up therapy are one component of a multi-disciplinary discharge planning process, led by the attending physician.  Recommendations may be updated based on patient status, additional functional criteria and insurance authorization.  Follow Up Recommendations  Acute inpatient rehab (3hours/day)     Assistance Recommended at Discharge Frequent or constant Supervision/Assistance  Patient can return home with the following Two people to help with walking and/or transfers;A lot of help with bathing/dressing/bathroom;Assistance with cooking/housework;Assistance with feeding;Direct supervision/assist for medications management;Direct supervision/assist for financial management;Assist for transportation;Help with stairs or ramp for entrance   Equipment Recommendations  None recommended by PT    Recommendations for Other Services       Precautions / Restrictions  Precautions Precautions: Fall Precaution Comments: seizures     Mobility  Bed Mobility Overal bed mobility: Needs Assistance Bed Mobility: Rolling, Sidelying to Sit, Supine to Sit Rolling: Min guard Sidelying to sit: Min guard, HOB elevated       General bed mobility comments: min guard for saftey and sequencing cues, increased time    Transfers Overall transfer level: Needs assistance Equipment used: Rolling walker (2 wheels) Transfers: Sit to/from Stand, Bed to chair/wheelchair/BSC Sit to Stand: Min assist, +2 safety/equipment   Step pivot transfers: Mod assist       General transfer comment: min A for power up, standing balance, and weight shifting, mod A for sequencing of stepping, R LE placement and clearance. Pt. shows improved ability to during retro gait with better R LE control and weight shifting. She continues to be anixous of falls and reports feeling light headed and nausea while standing or stepping. Mod A for stand to sit in recliner.    Ambulation/Gait                   Stairs             Wheelchair Mobility    Modified Rankin (Stroke Patients Only)       Balance Overall balance assessment: Needs assistance Sitting-balance support: Feet supported Sitting balance-Leahy Scale: Fair Sitting balance - Comments: Able to reach multi directional minimally out of BOS and return to sit with no LOB   Standing balance support: Bilateral upper extremity supported Standing balance-Leahy Scale: Poor Standing balance comment: requires min A x2 for stand and balance, increased assistance required to perfrom weight shifts                            Cognition Arousal/Alertness: Awake/alert Behavior During Therapy: Anxious Overall Cognitive  Status: Impaired/Different from baseline Area of Impairment: Orientation, Attention, Safety/judgement, Awareness, Problem solving                   Current Attention Level: Sustained    Following Commands: Follows multi-step commands inconsistently Safety/Judgement: Decreased awareness of safety, Decreased awareness of deficits   Problem Solving: Difficulty sequencing, Requires verbal cues, Requires tactile cues, Decreased initiation General Comments: pt. is anxious during any movement out of the bed, self limiting when trying to take steps.        Exercises      General Comments        Pertinent Vitals/Pain      Home Living                          Prior Function            PT Goals (current goals can now be found in the care plan section) Acute Rehab PT Goals Patient Stated Goal: not let her son see her like this PT Goal Formulation: With patient Time For Goal Achievement: 03/28/21 Potential to Achieve Goals: Fair Progress towards PT goals: Progressing toward goals    Frequency    Min 3X/week      PT Plan Current plan remains appropriate    Co-evaluation PT/OT/SLP Co-Evaluation/Treatment: Yes Reason for Co-Treatment: Complexity of the patient's impairments (multi-system involvement);Necessary to address cognition/behavior during functional activity;For patient/therapist safety PT goals addressed during session: Mobility/safety with mobility;Balance;Proper use of DME;Strengthening/ROM OT goals addressed during session: Proper use of Adaptive equipment and DME;Strengthening/ROM      AM-PAC PT "6 Clicks" Mobility   Outcome Measure  Help needed turning from your back to your side while in a flat bed without using bedrails?: A Little Help needed moving from lying on your back to sitting on the side of a flat bed without using bedrails?: A Lot Help needed moving to and from a bed to a chair (including a wheelchair)?: A Lot Help needed standing up from a chair using your arms (e.g., wheelchair or bedside chair)?: A Lot Help needed to walk in hospital room?: Total Help needed climbing 3-5 steps with a railing? : Total 6 Click Score:  11    End of Session Equipment Utilized During Treatment: Gait belt Activity Tolerance: Patient limited by fatigue;Other (comment) (limited by anxiety) Patient left: in chair;with call bell/phone within reach;with chair alarm set;Other (comment) (sitter in room) Nurse Communication: Mobility status PT Visit Diagnosis: Unsteadiness on feet (R26.81);Other abnormalities of gait and mobility (R26.89);Muscle weakness (generalized) (M62.81);Other symptoms and signs involving the nervous system (R29.898);Difficulty in walking, not elsewhere classified (R26.2)     Time: 5284-1324 PT Time Calculation (min) (ACUTE ONLY): 20 min  Charges:  $Therapeutic Activity: 8-22 mins                     Thermon Leyland, SPT Acute Rehab Services    Thermon Leyland 03/17/2021, 3:26 PM

## 2021-03-17 NOTE — Care Management Important Message (Signed)
Important Message  Patient Details  Name: [name] MRN: [id number] Date of Birth: 1970-05-29   Medicare Important Message Given:  Yes     Zaven Klemens Montine Circle 03/17/2021, 3:42 PM

## 2021-03-18 ENCOUNTER — Encounter: Payer: Self-pay | Admitting: Oncology

## 2021-03-18 ENCOUNTER — Inpatient Hospital Stay
Admission: RE | Admit: 2021-03-18 | Discharge: 2021-03-18 | Disposition: A | Discharge status: Home / Self Care | Payer: Medicare Other | Source: Ambulatory Visit | Attending: Gastroenterology | Admitting: Gastroenterology

## 2021-03-18 LAB — BASIC METABOLIC PANEL
Anion gap: 7 (ref 5–15)
BUN: 22 mg/dL — ABNORMAL HIGH (ref 6–20)
CO2: 22 mmol/L (ref 22–32)
Calcium: 7.8 mg/dL — ABNORMAL LOW (ref 8.9–10.3)
Chloride: 111 mmol/L (ref 98–111)
Creatinine, Ser: 1.78 mg/dL — ABNORMAL HIGH (ref 0.44–1.00)
GFR, Estimated: 34 mL/min — ABNORMAL LOW (ref >60)
Glucose, Bld: 154 mg/dL — ABNORMAL HIGH (ref 70–99)
Potassium: 3.8 mmol/L (ref 3.5–5.1)
Sodium: 140 mmol/L (ref 135–145)

## 2021-03-18 LAB — GLUCOSE, CAPILLARY
Glucose-Capillary: 105 mg/dL — ABNORMAL HIGH (ref 70–99)
Glucose-Capillary: 110 mg/dL — ABNORMAL HIGH (ref 70–99)
Glucose-Capillary: 112 mg/dL — ABNORMAL HIGH (ref 70–99)
Glucose-Capillary: 121 mg/dL — ABNORMAL HIGH (ref 70–99)
Glucose-Capillary: 140 mg/dL — ABNORMAL HIGH (ref 70–99)
Glucose-Capillary: 200 mg/dL — ABNORMAL HIGH (ref 70–99)

## 2021-03-18 MED ORDER — ACETAMINOPHEN-CODEINE #3 300-30 MG PO TABS
1.0000 | ORAL_TABLET | Freq: Four times a day (QID) | ORAL | Status: DC | PRN
  Administered 2021-03-18 – 2021-03-19 (×4): 1 via ORAL
  Filled 2021-03-18 (×4): qty 1

## 2021-03-18 MED ORDER — AMLODIPINE BESYLATE 5 MG PO TABS
5.0000 mg | ORAL_TABLET | Freq: Every day | ORAL | Status: DC
  Administered 2021-03-18 – 2021-03-20 (×3): 5 mg via ORAL
  Filled 2021-03-18 (×3): qty 1

## 2021-03-18 NOTE — Progress Notes (Signed)
PROGRESS NOTE  [name]  HCW:[id number] DOB: 02/27/1970 DOA: 03/08/2021 PCP: [name], MD   Brief Narrative:  Patient is a 51 year old female with past medical history of status epilepticus, coronary artery disease, diabetes type 2, CKD, GI bleed, CVA with left-sided weakness, hypothyroidism who presented to [facility] initially with complaints of altered mental status.  On presentation, he was also suspicious for UTI and was started on antibiotics.  CT head did not show any acute intracranial abnormalities.  She was thought to have status epilepticus and was transferred to [facility] for continuous EEG.  PCCM was consulted after admission.  Neurology, PCCM were following here.  Transferred to [facility] service on 2/10.  Mental status has significantly improved and she is currently alert and oriented.  PT/OT recommending acute inpatient rehab.  Patient also endorsed suicidal ideation so psychiatry consulted, on safety sitter but apparently has been cleared by psychiarty.  Waiting for placement at CIR. medically stable for discharge whenever possible.   Assessment & Plan:  Active Problems:   Altered mental status   Malnutrition of moderate degree   Urinary tract infection without hematuria   Hypertension associated with diabetes ([place])   Hypertensive emergency    Altered mental status: Metabolic encephalopathy secondary to suspected   non convulsive status epilepticus.   CT head imaging was negative for intracranial abnormalities.  Her mental status has significantly improved and she is alert and oriented mostly.MRI showed persistent foci of ill-defined diffusion hyperintensity in the posterior left frontal centrum semiovale probably reflecting subacute to chronic infarcts.No acute or new abnormality  Status epilepticus: Transferred to [facility] for continuous EEG.  Neurology following.  Video EEG showed diffuse slowing with continuous high-voltage spiky discharges indicative of  widespread cerebral disturbance with epileptogenic potential.  Concern for subclinical seizures.  Currently on Vimpat, Keppra.  Continuous EEG stopped on 2/8.Continue seizure precaution.repeat EEG did not show seizure activity.  Suicidal ideation: Family stress, concerned about burden on her son. psychiatry consulted.  Currently on Ativan and Effexor.  Psychiatry was following, however suicidal ideations/thoughts have significantly improved and she currently denies.  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  Psychiatry does not recommend inpatient psychiatric admission  Dysphagia: Secondary to altered mental status.  Initially started on tube feeding.  SLP following.  Currently on dysphagia 3 diet  UTI: UA was suspicious for urinary tract infection.  Urine cultures showed Enterobacter cloacae, treated with  Bactrim.  Denies any dysuria  Diabetes type 2: Monitor blood sugars.  Continue sliding scale insulin.  Hypertension: Remains hypertensive.  On  carvedilol, isosorbide, hydralazine.  Monitor blood pressure.  Added amlodipine  AKI on CKD stage IIIb: Baseline creatinine fluctuates from 1.3-1.8.  She follows with Dr. Zollie Scale at [facility].  Kidney function at baseline.IV fluids d/ced  Hypothyroidism: On Synthyroid  History of coronary artery disease: No anginal symptoms.  On aspirin, Plavix, statin at home which we will continue  Normocytic anemia: Currently hemoglobin stable in the range of 10.  Has mild thrombocytopenia, continue monitoring CBC intermittently  Debility/deconditioning: PT/OT recommending CIR on  discharge.       Nutrition Problem: Moderate Malnutrition Etiology: chronic illness (gastric bypass)    DVT prophylaxis:heparin injection 5,000 Units Start: 03/08/21 2300 SCDs Start: 03/08/21 2245     Code Status: Full Code  Family Communication:: Discussed with son on phone on 2/12  Patient status:Inpatient   Anticipated discharge to:CIR  Consultants:  Neurology, PCCM, psychiatry  Procedures: Video EEG  Antimicrobials:  Anti-infectives (  From admission, onward)    Start     Dose/Rate Route Frequency Ordered Stop   03/14/21 2200  sulfamethoxazole-trimethoprim (BACTRIM DS) 800-160 MG per tablet 1 tablet        1 tablet Oral Every 12 hours 03/14/21 1052 03/15/21 2040   03/11/21 1000  sulfamethoxazole-trimethoprim (BACTRIM DS) 800-160 MG per tablet 1 tablet  Status:  Discontinued        1 tablet Per Tube Every 12 hours 03/11/21 0809 03/14/21 1052   03/10/21 2000  piperacillin-tazobactam (ZOSYN) IVPB 3.375 g  Status:  Discontinued        3.375 g 12.5 mL/hr over 240 Minutes Intravenous Every 8 hours 03/10/21 1225 03/11/21 0809   03/10/21 1130  piperacillin-tazobactam (ZOSYN) IVPB 3.375 g        3.375 g 12.5 mL/hr over 240 Minutes Intravenous  Once 03/10/21 1034 03/10/21 1608   03/08/21 2215  cefTRIAXone (ROCEPHIN) 1 g in sodium chloride 0.9 % 100 mL IVPB        1 g 200 mL/hr over 30 Minutes Intravenous  Once 03/08/21 2208 03/08/21 2344       Subjective: Patient seen and examined at the bedside this morning.  Hemodynamically stable.  Overall comfortable.  Denies any complaints today.  Remains alert and oriented.  Interested on going to CIR.  Denies any suicidal thoughts  Objective: Vitals:   03/17/21 1955 03/17/21 2359 03/18/21 0430 03/18/21 0744  BP: (!) 143/77 (!) 160/80 (!) 160/70 (!) 167/72  Pulse: 71 74 65 70  Resp: 16 15 16 20   Temp: 98.5 F (36.9 C) 98.6 F (37 C) 98.2 F (36.8 C) 99 F (37.2 C)  TempSrc: Oral Axillary Oral Oral  SpO2: 99%   99%  Weight:      Height:        Intake/Output Summary (Last 24 hours) at 03/18/2021 1105 Last data filed at 03/18/2021 1023 Gross per 24 hour  Intake 2109.33 ml  Output 2600 ml  Net -490.67 ml   Filed Weights   03/11/21 0500 03/14/21 0500 03/16/21 0503  Weight: 73.7 kg 74.3 kg 71.9 kg    Examination:   General exam: Overall comfortable, not in distress HEENT:  PERRL Respiratory system:  no wheezes or crackles  Cardiovascular system: S1 & S2 heard, RRR.  Gastrointestinal system: Abdomen is nondistended, soft and nontender. Central nervous system: Alert and oriented Extremities: No edema, no clubbing ,no cyanosis Skin: No rashes, no ulcers,no icterus    Data Reviewed: I have personally reviewed following labs and imaging studies  CBC: Recent Labs  Lab 03/12/21 0441 03/13/21 0738 03/14/21 0118  WBC 8.7 9.8 9.6  HGB 14.5 11.6* 10.7*  HCT 42.6 35.6* 31.7*  MCV 81.8 84.0 83.2  PLT 157 166 841*   Basic Metabolic Panel: Recent Labs  Lab 03/11/21 1555 03/12/21 0441 03/12/21 0441 03/13/21 0738 03/14/21 0118 03/15/21 0227 03/16/21 0424 03/17/21 0707 03/18/21 0906  NA  --  136   < > 135 132* 135 135 136 140  K  --  4.1   < > 3.9 4.5 5.1 4.5 4.4 3.8  CL  --  100   < > 101 100 101 99 100 111  CO2  --  22   < > 26 24 25 26 26 22   GLUCOSE  --  115*   < > 152* 123* 105* 118* 131* 154*  BUN  --  28*   < > 32* 33* 28* 24* 25* 22*  CREATININE  --  1.48*   < >  1.83* 1.94* 2.02* 1.86* 2.16* 1.78*  CALCIUM  --  8.9   < > 8.3* 8.1* 8.5* 8.9 8.8* 7.8*  MG 2.5* 2.2  --  2.5* 2.5*  --   --   --   --   PHOS 4.3  --   --   --   --   --   --   --   --    < > = values in this interval not displayed.     Recent Results (from the past 240 hour(s))  Resp Panel by RT-PCR (Flu A&B, Covid) Nasopharyngeal Swab     Status: None   Collection Time: 03/08/21  2:25 PM   Specimen: Nasopharyngeal Swab; Nasopharyngeal(NP) swabs in vial transport medium  Result Value Ref Range Status   SARS Coronavirus 2 by RT PCR NEGATIVE NEGATIVE Final    Comment: (NOTE) SARS-CoV-2 target nucleic acids are NOT DETECTED.  The SARS-CoV-2 RNA is generally detectable in upper respiratory specimens during the acute phase of infection. The lowest concentration of SARS-CoV-2 viral copies this assay can detect is 138 copies/mL. A negative result does not preclude  SARS-Cov-2 infection and should not be used as the sole basis for treatment or other patient management decisions. A negative result may occur with  improper specimen collection/handling, submission of specimen other than nasopharyngeal swab, presence of viral mutation(s) within the areas targeted by this assay, and inadequate number of viral copies(<138 copies/mL). A negative result must be combined with clinical observations, patient history, and epidemiological information. The expected result is Negative.  Fact Sheet for Patients:  EntrepreneurPulse.com.au  Fact Sheet for Healthcare Providers:  IncredibleEmployment.be  This test is no t yet approved or cleared by the [place] FDA and  has been authorized for detection and/or diagnosis of SARS-CoV-2 by FDA under an Emergency Use Authorization (EUA). This EUA will remain  in effect (meaning this test can be used) for the duration of the COVID-19 declaration under Section 564(b)(1) of the Act, 21 U.S.C.section 360bbb-3(b)(1), unless the authorization is terminated  or revoked sooner.       Influenza A by PCR NEGATIVE NEGATIVE Final   Influenza B by PCR NEGATIVE NEGATIVE Final    Comment: (NOTE) The Xpert Xpress SARS-CoV-2/FLU/RSV plus assay is intended as an aid in the diagnosis of influenza from Nasopharyngeal swab specimens and should not be used as a sole basis for treatment. Nasal washings and aspirates are unacceptable for Xpert Xpress SARS-CoV-2/FLU/RSV testing.  Fact Sheet for Patients: EntrepreneurPulse.com.au  Fact Sheet for Healthcare Providers: IncredibleEmployment.be  This test is not yet approved or cleared by the [place] FDA and has been authorized for detection and/or diagnosis of SARS-CoV-2 by FDA under an Emergency Use Authorization (EUA). This EUA will remain in effect (meaning this test can be used) for the duration of  the COVID-19 declaration under Section 564(b)(1) of the Act, 21 U.S.C. section 360bbb-3(b)(1), unless the authorization is terminated or revoked.  Performed at [facility], [address], [place], [place] [address]   Urine Culture     Status: Abnormal   Collection Time: 03/08/21  8:45 PM   Specimen: Urine, Clean Catch  Result Value Ref Range Status   Specimen Description URINE, CLEAN CATCH  Final   Special Requests   Final    ADDED 2311 Performed at [facility], [place] [address], [place], [place] [address]    Culture >=100,000 COLONIES/mL ENTEROBACTER CLOACAE (A)  Final   Report Status 03/11/2021 FINAL  Final  Organism ID, Bacteria ENTEROBACTER CLOACAE (A)  Final      Susceptibility   Enterobacter cloacae - MIC*    CEFAZOLIN >=64 RESISTANT Resistant     CEFEPIME 8 INTERMEDIATE Intermediate     CIPROFLOXACIN 1 RESISTANT Resistant     GENTAMICIN <=1 SENSITIVE Sensitive     IMIPENEM 0.5 SENSITIVE Sensitive     NITROFURANTOIN >=512 RESISTANT Resistant     TRIMETH/SULFA <=20 SENSITIVE Sensitive     PIP/TAZO >=128 RESISTANT Resistant     * >=100,000 COLONIES/mL ENTEROBACTER CLOACAE  MRSA Next Gen by PCR, Nasal     Status: None   Collection Time: 03/09/21 12:25 AM   Specimen: Nasal Mucosa; Nasal Swab  Result Value Ref Range Status   MRSA by PCR Next Gen NOT DETECTED NOT DETECTED Final    Comment: (NOTE) The GeneXpert MRSA Assay (FDA approved for NASAL specimens only), is one component of a comprehensive MRSA colonization surveillance program. It is not intended to diagnose MRSA infection nor to guide or monitor treatment for MRSA infections. Test performance is not FDA approved in patients less than 81 years old. Performed at [facility], [place] [address] [place], [place] [address]      Radiology Studies: No results found.  Scheduled Meds:  amLODipine  5 mg Oral Daily   aspirin  81 mg Oral Daily   baclofen  5 mg Oral QHS   carvedilol  12.5 mg  Oral BID WC   chlorhexidine  15 mL Mouth Rinse BID   Chlorhexidine Gluconate Cloth  6 each Topical Daily   clopidogrel  75 mg Oral Daily   gabapentin  100 mg Oral BID   heparin  5,000 Units Subcutaneous Q8H   hydrALAZINE  100 mg Oral Q8H   insulin aspart  0-15 Units Subcutaneous Q4H   isosorbide mononitrate  120 mg Oral Daily   lacosamide  200 mg Oral BID   levETIRAcetam  1,000 mg Oral BID   levothyroxine  25 mcg Oral QAC breakfast   mouth rinse  15 mL Mouth Rinse q12n4p   mirtazapine  7.5 mg Oral QHS   multivitamin with minerals  1 tablet Oral BID   vitamin B-6  100 mg Oral Daily   rosuvastatin  40 mg Oral q1800   sodium chloride flush  10-40 mL Intracatheter Q12H   venlafaxine  37.5 mg Oral BID WC   [START ON 03/20/2021] Vitamin D (Ergocalciferol)  50,000 Units Oral Q7 days   Continuous Infusions:  sodium chloride 75 mL/hr at 03/18/21 0654   promethazine (PHENERGAN) injection (IM or IVPB) Stopped (03/17/21 1711)     LOS: 10 days   Shelly Coss, MD [facility] P2/14/2023, 11:05 AM

## 2021-03-18 NOTE — Plan of Care (Signed)
  Problem: Education: Goal: Knowledge of General Education information will improve Description: Including pain rating scale, medication(s)/side effects and non-pharmacologic comfort measures Outcome: Progressing   Problem: Nutrition: Goal: Adequate nutrition will be maintained Outcome: Progressing   Problem: Safety: Goal: Ability to remain free from injury will improve Outcome: Progressing   

## 2021-03-18 NOTE — Progress Notes (Signed)
OT comment: [name] is making incremental progress towards her goals. She benefits from gentle encouragement and increased time for anxiety management. Overall pt was min A +2 for transfers and short ambulation in the room with cues for step-by-step sequencing. Pt became diaphoretic with each transfer, BP was stable. Pt is limited by generalized weakness, impaired cognition, anxiety and poor activity tolerance. D/c remains appropriate, OT to continue to follow acutely.    03/17/21 1600  OT Visit Information  Last OT Received On 03/17/21  Assistance Needed +2  History of Present Illness 51 y.o. female, who presented to the [facility] ED as a transfer from [facility] ED with a chief complaint of AMS. She has a pertinent past medical history of subclinical status epilepticus, CAD, DM2, CKD, GIB, CVA with left sided weakness, hypothyroidism.  Precautions  Precautions Fall  Precaution Comments seizures  Restrictions  Weight Bearing Restrictions No  Pain Assessment  Pain Assessment Faces  Faces Pain Scale 4  Pain Location BLE?  Pain Descriptors / Indicators Aching;Sore  Pain Intervention(s) Limited activity within patient's tolerance;Monitored during session  Cognition  Arousal/Alertness Awake/alert  Behavior During Therapy Anxious  Overall Cognitive Status Impaired/Different from baseline  Area of Impairment Attention;Safety/judgement;Awareness;Problem solving  Current Attention Level Sustained  Following Commands Follows multi-step commands inconsistently  Safety/Judgement Decreased awareness of safety;Decreased awareness of deficits  Awareness Intellectual  Problem Solving Slow processing;Decreased initiation;Difficulty sequencing;Requires verbal cues  General Comments very anxious, self limiting needs encouragement and direct commnads  Upper Extremity Assessment  Upper Extremity Assessment RUE deficits/detail;LUE deficits/detail  RUE Deficits / Details Full ROM, strength is globally 4/5. Using  functionally for self feeding and RW management.  LUE Deficits / Details Full ROM, strength is globally 4/5. Using functionally for self feeding and RW management. slow and deliberate coordination  Lower Extremity Assessment  Lower Extremity Assessment Defer to PT evaluation  Vision- Assessment  Vision Assessment? Vision impaired- to be further tested in functional context  Praxis  Praxis Not tested  ADL  Overall ADL's  Needs assistance/impaired  Eating/Feeding Independent;Sitting  Eating/Feeding Details (indicate cue type and reason) using R for utencil  Toilet Transfer Minimal assistance;+2 for physical assistance;+2 for safety/equipment;Ambulation;BSC/3in1  Toilet Transfer Details (indicate cue type and reason) Rigid, requires cues for weight shifting and lifting each LE.  Functional mobility during ADLs Minimal assistance;+2 for physical assistance;+2 for safety/equipment  General ADL Comments pt diaphoretic with short ambulation, BP stable, possible due to anxiety? Pt intially stating "I cannot text one my phone" however upon assessement of ROM, strength and coordination of BUE they were mildy diminished but WFL  Bed Mobility  Overal bed mobility Needs Assistance  Bed Mobility Sit to Supine  Sit to supine Min assist  General bed mobility comments min A for LE  Transfers  Overall transfer level Needs assistance  Equipment used Rolling walker (2 wheels)  Transfers Sit to/from Stand;Bed to chair/wheelchair/BSC  Sit to Stand Min assist;+2 physical assistance;+2 safety/equipment  Bed to/from chair/wheelchair/BSC transfer type: Step pivot  Step pivot transfers Min assist;+2 physical assistance;+2 safety/equipment  General transfer comment assist for step by step cues and weight shifting, pt lifting and scooting BLE better this session  Balance  Overall balance assessment Needs assistance  Sitting-balance support Feet supported  Sitting balance-Leahy Scale Fair  Sitting balance -  Comments Able to reach multi directional minimally out of BOS and return to sit with no LOB  Standing balance support Bilateral upper extremity supported  Standing balance-Leahy Scale Poor  Standing balance comment  requires min A x2 for stand and balance, increased assistance required to perfrom weight shifts  General Comments  General comments (skin integrity, edema, etc.) VSS on RA. pt became disphoretic with each ambulation attempt, BP was stable. RN notified  OT - End of Session  Equipment Utilized During Treatment Gait belt;Rolling walker (2 wheels)  Activity Tolerance Patient tolerated treatment well  Patient left in bed;with call bell/phone within reach;with nursing/sitter in room  Nurse Communication Mobility status  OT Assessment/Plan  OT Plan Discharge plan remains appropriate;Frequency remains appropriate  OT Visit Diagnosis Unsteadiness on feet (R26.81);Other abnormalities of gait and mobility (R26.89);Muscle weakness (generalized) (M62.81);Other symptoms and signs involving the nervous system (R29.898);Other symptoms and signs involving cognitive function  OT Frequency (ACUTE ONLY) Min 2X/week  Recommendations for Other Services Rehab consult  Follow Up Recommendations Acute inpatient rehab (3hours/day)  Assistance recommended at discharge Frequent or constant Supervision/Assistance  Patient can return home with the following Two people to help with bathing/dressing/bathroom;Two people to help with walking and/or transfers;Assistance with cooking/housework;Direct supervision/assist for medications management;Direct supervision/assist for financial management;Assist for transportation;Help with stairs or ramp for entrance  OT Equipment None recommended by OT  AM-PAC OT "6 Clicks" Daily Activity Outcome Measure (Version 2)  Help from another person eating meals? 3  Help from another person taking care of personal grooming? 3  Help from another person toileting, which includes using  toliet, bedpan, or urinal? 2  Help from another person bathing (including washing, rinsing, drying)? 2  Help from another person to put on and taking off regular upper body clothing? 3  Help from another person to put on and taking off regular lower body clothing? 2  6 Click Score 15  Progressive Mobility  What is the highest level of mobility based on the progressive mobility assessment? Level 4 (Walks with assist in room) - Balance while marching in place and cannot step forward and back - Complete  Activity Ambulated with assistance in room;Stood at bedside  OT Goal Progression  Progress towards OT goals Progressing toward goals  Acute Rehab OT Goals  Patient Stated Goal less pain  OT Goal Formulation Patient unable to participate in goal setting  Time For Goal Achievement 03/27/21  Potential to Achieve Goals Good  ADL Goals  Pt Will Perform Eating with mod assist  Pt Will Perform Grooming with mod assist;sitting  Pt Will Perform Upper Body Bathing with mod assist;sitting  Pt Will Perform Lower Body Bathing sit to/from stand;with mod assist  Pt Will Perform Lower Body Dressing with modified independence;sit to/from stand  Pt Will Transfer to Toilet with modified independence;ambulating  Pt Will Perform Tub/Shower Transfer with supervision;ambulating  Additional ADL Goal #1 Maintain midline postural control EOB with min A in preparation for ADL tasks  Additional ADL Goal #2 Consistently follow 1 step commands in minimally distracting environment  OT Time Calculation  OT Start Time (ACUTE ONLY) 1435  OT Stop Time (ACUTE ONLY) 1450  OT Time Calculation (min) 15 min  OT General Charges  $OT Visit 1 Visit  OT Treatments  $Therapeutic Activity 8-22 mins  Perception  Perception Not tested

## 2021-03-18 NOTE — Progress Notes (Signed)
Inpatient Rehab Admissions Coordinator:   Following for my colleague, Gayland Curry.  Awaiting updated OT notes to begin insurance authorization.    Shann Medal, PT, DPT Admissions Coordinator [phone] 03/18/21  8:16 AM

## 2021-03-18 NOTE — Progress Notes (Signed)
Inpatient Rehab Admissions Coordinator:   Insurance authorization begun.  Will follow for determination.   Shann Medal, PT, DPT Admissions Coordinator [phone] 03/18/21  11:11 AM

## 2021-03-19 ENCOUNTER — Encounter: Payer: Self-pay | Admitting: Oncology

## 2021-03-19 DIAGNOSIS — I161 Hypertensive emergency: Secondary | ICD-10-CM

## 2021-03-19 DIAGNOSIS — E440 Moderate protein-calorie malnutrition: Secondary | ICD-10-CM

## 2021-03-19 LAB — GLUCOSE, CAPILLARY
Glucose-Capillary: 107 mg/dL — ABNORMAL HIGH (ref 70–99)
Glucose-Capillary: 110 mg/dL — ABNORMAL HIGH (ref 70–99)
Glucose-Capillary: 160 mg/dL — ABNORMAL HIGH (ref 70–99)
Glucose-Capillary: 197 mg/dL — ABNORMAL HIGH (ref 70–99)
Glucose-Capillary: 218 mg/dL — ABNORMAL HIGH (ref 70–99)
Glucose-Capillary: 223 mg/dL — ABNORMAL HIGH (ref 70–99)
Glucose-Capillary: 66 mg/dL — ABNORMAL LOW (ref 70–99)

## 2021-03-19 MED ORDER — INSULIN ASPART 100 UNIT/ML IJ SOLN: 0.0000 [IU] | Freq: Every day | INTRAMUSCULAR | Status: DC

## 2021-03-19 MED ORDER — INSULIN ASPART 100 UNIT/ML IJ SOLN: 0.0000 [IU] | Freq: Three times a day (TID) | INTRAMUSCULAR | Status: DC

## 2021-03-19 MED ORDER — INSULIN ASPART 100 UNIT/ML IJ SOLN
0.0000 [IU] | Freq: Three times a day (TID) | INTRAMUSCULAR | Status: DC
  Administered 2021-03-19: 4 [IU] via SUBCUTANEOUS
  Administered 2021-03-20: 2 [IU] via SUBCUTANEOUS

## 2021-03-19 MED ORDER — ENSURE MAX PROTEIN PO LIQD
11.0000 [oz_av] | Freq: Two times a day (BID) | ORAL | Status: DC
  Administered 2021-03-19 – 2021-03-20 (×3): 11 [oz_av] via ORAL
  Filled 2021-03-19 (×4): qty 330

## 2021-03-19 NOTE — Progress Notes (Signed)
Nutrition Follow-up  DOCUMENTATION CODES:   Non-severe (moderate) malnutrition in context of chronic illness  INTERVENTION:  Ensure Max po BID, each supplement provides 150 kcal and 30 grams of protein  MVI with minerals BID   Vitamin D 50,000 IU weekly x 8 weeks Vitamin B6 100 mg daily x 2 weeks   Encourage PO intake at meals   NUTRITION DIAGNOSIS:   Moderate Malnutrition related to chronic illness (gastric bypass) as evidenced by mild fat depletion, moderate fat depletion, mild muscle depletion, moderate muscle depletion.  ongoing  GOAL:   Patient will meet greater than or equal to 90% of their needs  progressing  MONITOR:   Diet advancement, TF tolerance, Skin, Labs, Weight trends  REASON FOR ASSESSMENT:   Consult Enteral/tube feeding initiation and management  ASSESSMENT:   51 yo female admitted with AMS post prolonged seizure. PMH includes gastric bypass, DM, CAD, CKD, GIB, CVA with left sided weakness, subclinical status epilepticus  2/5 s/p cortrak placement tip in jejunum  2/9 s/p swallow eval; pt started on Dysphagia 3 diet with thin liquids, SLP recommends family bring in dentures 2/10 cortrak removed  Per MD, pt is medically stable for d/c when possible. Plan was for pt to d/c to CIR, though insurance denied this. Pending peer-to-peer.  PO Intake: 25-100% x last 8 recorded meals (56% avg meal intake)  UOP: 2166ml x24 hours I/O: -6346ml since admit  Current weight: 71.6 kg Admit weight: 73.7 kg   Medications: SSI TID w/ meals, remeron, mvi with minerals, vitamin B6, vitamin D, phenergan Labs reviewed. CBGs: 66-218 x24 hours Copper: 126 CRP: 0.7 Vitamin B1: 141 Vitamin D: 19 - LOW  Vitamin B12: 356 Vitamin B6: 2.6 - LOW  Diet Order:   Diet Order             DIET DYS 3 Room service appropriate? Yes; Fluid consistency: Thin  Diet effective now                   EDUCATION NEEDS:   Not appropriate for education at this  time  Skin:  Skin Assessment: Reviewed RN Assessment  Last BM:  2/12  Height:   Ht Readings from Last 1 Encounters:  03/10/21 5\' 3"  (1.6 m)    Weight:   Wt Readings from Last 1 Encounters:  03/16/21 71.9 kg     BMI:  Body mass index is 28.08 kg/m.  Estimated Nutritional Needs:   Kcal:  3500-9381 kcals  Protein:  85-100 g  Fluid:  >/= 1.8 L     Ralston Venus A., MS, RD, LDN (she/her/hers) RD pager number and weekend/on-call pager number located in [place].

## 2021-03-19 NOTE — Progress Notes (Signed)
Inpatient Rehab Admissions Coordinator:   Peer to peer complete and request for CIR is denied. TOC aware and discussed with Pt/family who's preference is for d/c home.  CIR will sign off at this time.   Shann Medal, PT, DPT Admissions Coordinator [phone] 03/19/21  3:45 PM

## 2021-03-19 NOTE — Progress Notes (Signed)
Hypoglycemic Event  CBG: 66  Treatment: 4 oz juice/soda  Symptoms: None  Follow-up CBG: Time:0446 CBG Result:110  Possible Reasons for Event: Inadequate meal intake  Comments/MD notified:Denies any complaints. Remains asymptomatic    [name]

## 2021-03-19 NOTE — Progress Notes (Signed)
Inpatient Rehab Admissions Coordinator:   Awaiting insurance determination.  Will continue to follow.   Shann Medal, PT, DPT Admissions Coordinator [phone] 03/19/21  9:55 AM

## 2021-03-19 NOTE — TOC Progression Note (Signed)
Transition of Care [facility]) - Progression Note    Patient Details  Name: [name] MRN: [id number] Date of Birth: 1970/09/11  Transition of [facility] CM/SW Contact  Pollie Friar, RN Phone Number: 03/19/2021, 4:03 PM  Clinical Narrative:    Patient has been denied for CIR. CM spoke with the patient and she asked that I speak to her son about discharge plans. She is concerned about not being able to walk when she gets home. CM called [name] (son) and he prefers his mother discharge home with home health services. He asked that they come out more than twice a week. CM has updated him that they come out as often as medicare pays for visits and we can not change the number of visits.  Pt will be discharging to her mothers home in [facility]: [address] in [place]. Per [name] she will have the support and supervision she needs at her mothers home.  Wheelchair for home ordered through [employer] and will be delivered to the patients room.  Son asked to have until tomorrow to get family arranged for discharge.  Son will provide transport home.    Expected Discharge Plan: IP Rehab Facility Barriers to Discharge: Continued Medical Work up  Expected Discharge Plan and Services Expected Discharge Plan: [place]   Discharge Planning Services: CM Consult Post Acute Care Choice: Durable Medical Equipment, Home Health Living arrangements for the past 2 months: Single Family Home                                       Social Determinants of Health (SDOH) Interventions    Readmission Risk Interventions Readmission Risk Prevention Plan 11/07/2019 10/18/2019 09/05/2019  Transportation Screening Complete Complete Complete  Medication Review [employer] photographer) Complete Complete Complete  PCP or Specialist appointment within 3-5 days of discharge (No Data) Complete Complete  HRI or Home Care Consult Complete Complete -  SW Recovery Care/Counseling Consult -  Complete Complete  Palliative Care Screening Not Applicable Not Applicable Not [place] Not Applicable Not Applicable Not Applicable  Some recent data might be hidden

## 2021-03-19 NOTE — Progress Notes (Signed)
Inpatient Rehab Admissions Coordinator:   Notified of request for peer to peer by [facility].  Dr. Bonner Puna aware and agreed to complete.  Will follow for determination.   Shann Medal, PT, DPT Admissions Coordinator [phone] 03/19/21  11:37 AM

## 2021-03-19 NOTE — Progress Notes (Signed)
Physical Therapy Treatment Patient Details Name: [name] MRN: [id number] DOB: 10-01-1970 Today's Date: 03/19/2021   History of Present Illness 51 y.o. female, who presented to the [facility] ED as a transfer from [facility] ED with a chief complaint of AMS. She has a pertinent past medical history of subclinical status epilepticus, CAD, DM2, CKD, GIB, CVA with left sided weakness, hypothyroidism.    PT Comments    Pt now set to d/c to mother's home with support of mother, sister, brother-in-law, and son. PT focused today's session on transfer-level mobility, which pt performs at min assist level at this time. Pt will need w/c for mobility given limited gait ability at this time, CSM notified. PT to continue to follow.    Recommendations for follow up therapy are one component of a multi-disciplinary discharge planning process, led by the attending physician.  Recommendations may be updated based on patient status, additional functional criteria and insurance authorization.  Follow Up Recommendations  Home health PT     Assistance Recommended at Discharge Frequent or constant Supervision/Assistance  Patient can return home with the following Assistance with cooking/housework;Assistance with feeding;Direct supervision/assist for medications management;Direct supervision/assist for financial management;Assist for transportation;Help with stairs or ramp for entrance;A little help with walking and/or transfers;A little help with bathing/dressing/bathroom   Equipment Recommendations  Wheelchair cushion (measurements PT);Wheelchair (measurements PT)    Recommendations for Other Services       Precautions / Restrictions Precautions Precautions: Fall Precaution Comments: seizures Restrictions Weight Bearing Restrictions: No     Mobility  Bed Mobility Overal bed mobility: Needs Assistance Bed Mobility: Supine to Sit, Sit to Supine     Supine to sit: Min assist Sit to supine: Min  assist   General bed mobility comments: assist for LE and trunk management, pt able to boost self up in bed with use of UEs and bedrails. Increased time and effort.    Transfers Overall transfer level: Needs assistance Equipment used: Rolling walker (2 wheels) Transfers: Bed to chair/wheelchair/BSC, Sit to/from Stand Sit to Stand: Min assist   Step pivot transfers: Min assist       General transfer comment: assist for rise, steadying, and pivot to/from chair towards pt's R. Cues for sequencing and RW use.    Ambulation/Gait               General Gait Details: unable today given LE pain and fatigue   Stairs             Wheelchair Mobility    Modified Rankin (Stroke Patients Only)       Balance Overall balance assessment: Needs assistance Sitting-balance support: Feet supported Sitting balance-Leahy Scale: Fair     Standing balance support: Bilateral upper extremity supported Standing balance-Leahy Scale: Poor Standing balance comment: reliant on external support                            Cognition Arousal/Alertness: Awake/alert Behavior During Therapy: Anxious Overall Cognitive Status: Impaired/Different from baseline Area of Impairment: Attention, Safety/judgement, Awareness, Problem solving                   Current Attention Level: Sustained   Following Commands: Follows multi-step commands inconsistently Safety/Judgement: Decreased awareness of safety, Decreased awareness of deficits Awareness: Intellectual Problem Solving: Decreased initiation, Difficulty sequencing, Requires verbal cues General Comments: very anxious, self limiting needs encouragement and direct commnads        Exercises  General Comments        Pertinent Vitals/Pain Pain Assessment Pain Assessment: Faces Faces Pain Scale: Hurts little more Pain Location: LLE>RLE Pain Descriptors / Indicators: Aching, Sore Pain Intervention(s): Limited  activity within patient's tolerance, Monitored during session, Repositioned    Home Living                          Prior Function            PT Goals (current goals can now be found in the care plan section) Acute Rehab PT Goals PT Goal Formulation: With patient Time For Goal Achievement: 03/28/21 Potential to Achieve Goals: Fair Progress towards PT goals: Progressing toward goals    Frequency    Min 3X/week      PT Plan Discharge plan needs to be updated    Co-evaluation              AM-PAC PT "6 Clicks" Mobility   Outcome Measure  Help needed turning from your back to your side while in a flat bed without using bedrails?: A Little Help needed moving from lying on your back to sitting on the side of a flat bed without using bedrails?: A Little Help needed moving to and from a bed to a chair (including a wheelchair)?: A Little Help needed standing up from a chair using your arms (e.g., wheelchair or bedside chair)?: A Little Help needed to walk in hospital room?: A Little Help needed climbing 3-5 steps with a railing? : A Lot 6 Click Score: 17    End of Session   Activity Tolerance: Patient limited by fatigue;Other (comment) Patient left: in bed;with call bell/phone within reach;with bed alarm set Nurse Communication: Mobility status PT Visit Diagnosis: Unsteadiness on feet (R26.81);Other abnormalities of gait and mobility (R26.89);Muscle weakness (generalized) (M62.81);Other symptoms and signs involving the nervous system (R29.898);Difficulty in walking, not elsewhere classified (R26.2)     Time: 6387-5643 PT Time Calculation (min) (ACUTE ONLY): 18 min  Charges:  $Therapeutic Activity: 8-22 mins                     Stacie Glaze, PT DPT Acute Rehabilitation Services Pager [phone]  Office [phone]    Roxine Caddy E Ruffin Pyo 03/19/2021, 4:40 PM

## 2021-03-19 NOTE — Progress Notes (Signed)
Progress Note  Patient: [name] TKP:[id number] DOB: 1971/01/01  DOA: 03/08/2021  DOS: 03/19/2021    Brief hospital course: Patient is a 51 year old female with past medical history of status epilepticus, coronary artery disease, diabetes type 2, CKD, GI bleed, CVA with left-sided weakness, hypothyroidism who presented to [facility] initially with complaints of altered mental status.  On presentation, he was also suspicious for UTI and was started on antibiotics.  CT head did not show any acute intracranial abnormalities.  She was thought to have status epilepticus and was transferred to [facility] for continuous EEG.  PCCM was consulted after admission.  Neurology, PCCM were following here.  Transferred to [facility] service on 2/10.  Mental status has significantly improved and she is currently alert and oriented.  PT/OT recommending acute inpatient rehab.  Patient also endorsed suicidal ideation so psychiatry consulted, on safety sitter but apparently has been cleared by psychiarty.  Waiting for placement at CIR. medically stable for discharge whenever possible.  Assessment and Plan: Altered mental status: Metabolic encephalopathy secondary to suspected   non convulsive status epilepticus.   CT head imaging was negative for intracranial abnormalities.  Her mental status has significantly improved and she is alert and oriented mostly. MRI showed persistent foci of ill-defined diffusion hyperintensity in the posterior left frontal centrum semiovale probably reflecting subacute to chronic infarcts. No acute or new abnormality.   Status epilepticus: Transferred to [facility] for continuous EEG.  Neurology following.  Video EEG showed diffuse slowing with continuous high-voltage spiky discharges indicative of widespread cerebral disturbance with epileptogenic potential.  Concern for subclinical seizures. Continuous EEG stopped on 2/8. Repeat EEG did not show seizure activity. - Continue Vimpat, Keppra.  -  Continue seizure precautions.    Suicidal ideation: Family stress, concerned about burden on her son.  - SI resolved. D/w Dr. Lovette Cliche today who is signing off. Recommend continuing effexor until DC, then restart pristiq. Will continue remeron which helped symptoms significantly.     Dysphagia: Secondary to altered mental status.  Initially started on tube feeding.  SLP following.  Currently on dysphagia 3 diet   UTI: UA was suspicious for urinary tract infection.  Urine cultures showed Enterobacter cloacae, treated with  Bactrim.  Denies any dysuria   Diabetes type 2: Monitor blood sugars.  Continue sliding scale insulin.   Hypertension: Remains hypertensive.  On  carvedilol, isosorbide, hydralazine.  Monitor blood pressure.  Added amlodipine   AKI on CKD stage IIIb: Baseline creatinine fluctuates from 1.3-1.8.  She follows with Dr. Zollie Scale at [facility].  Kidney function at baseline.IV fluids d/ced   Hypothyroidism: On Synthyroid   History of coronary artery disease: No anginal symptoms.  On aspirin, Plavix, statin at home which we will continue   Normocytic anemia: Currently hemoglobin stable in the range of 10.  Has mild thrombocytopenia, continue monitoring CBC intermittently   Debility/deconditioning: PT/OT recommending CIR on discharge.     Demyelinating disorder:  - Continue follow up with outpatient neurology   Moderate Malnutrition: Supp as able  Subjective: Starting to be able to feed herself again, had severe weakness when attempting OOB yesterday. Her baseline is able to do laundry, cook for herself, get around with walker. No seizure activity noted in past 24 hours, mentation nearing baseline.  Objective: Vitals:   03/19/21 0112 03/19/21 0411 03/19/21 0751 03/19/21 1058  BP: 138/65 (!) 155/95 (!) 186/76 (!) 149/97  Pulse: 81 (!) 101 75 78  Resp: 16 18 16  16  Temp: 98.4 F (36.9 C) 98.6 F (37 C) 98.7 F (37.1 C) 97.8 F (36.6 C)  TempSrc:  Oral Oral  Oral  SpO2: 99% 100% 100% 100%  Weight:      Height:       Gen: 51 y.o. female in no distress Pulm: Nonlabored breathing room air.  CV: Regular rate and rhythm. No murmur, rub, or gallop. No JVD, no dependent edema. GI: Abdomen soft, non-tender, non-distended, with normoactive bowel sounds.  Ext: Warm, no new deformities Skin: No new rashes, lesions or ulcers on visualized skin. Neuro: Alert and oriented. No new focal neurological deficits. Psych: Judgement and insight appear marginal. Mood euthymic & affect congruent. Behavior is appropriate.    Data Personally reviewed:  CBC: Recent Labs  Lab 03/13/21 0738 03/14/21 0118  WBC 9.8 9.6  HGB 11.6* 10.7*  HCT 35.6* 31.7*  MCV 84.0 83.2  PLT 166 818*   Basic Metabolic Panel: Recent Labs  Lab 03/13/21 0738 03/14/21 0118 03/15/21 0227 03/16/21 0424 03/17/21 0707 03/18/21 0906  NA 135 132* 135 135 136 140  K 3.9 4.5 5.1 4.5 4.4 3.8  CL 101 100 101 99 100 111  CO2 26 24 25 26 26 22   GLUCOSE 152* 123* 105* 118* 131* 154*  BUN 32* 33* 28* 24* 25* 22*  CREATININE 1.83* 1.94* 2.02* 1.86* 2.16* 1.78*  CALCIUM 8.3* 8.1* 8.5* 8.9 8.8* 7.8*  MG 2.5* 2.5*  --   --   --   --    GFR: Estimated Creatinine Clearance: 35.1 mL/min (A) (by C-G formula based on SCr of 1.78 mg/dL (H)). Liver Function Tests: No results for input(s): AST, ALT, ALKPHOS, BILITOT, PROT, ALBUMIN in the last 168 hours. No results for input(s): LIPASE, AMYLASE in the last 168 hours. Recent Labs  Lab 03/17/21 1017  AMMONIA 15   Coagulation Profile: No results for input(s): INR, PROTIME in the last 168 hours. Cardiac Enzymes: No results for input(s): CKTOTAL, CKMB, CKMBINDEX, TROPONINI in the last 168 hours. BNP (last 3 results) No results for input(s): PROBNP in the last 8760 hours. HbA1C: No results for input(s): HGBA1C in the last 72 hours. CBG: Recent Labs  Lab 03/19/21 0027 03/19/21 0430 03/19/21 0446 03/19/21 0754 03/19/21 1100  GLUCAP 218*  66* 110* 107* 197*   Lipid Profile: No results for input(s): CHOL, HDL, LDLCALC, TRIG, CHOLHDL, LDLDIRECT in the last 72 hours. Thyroid Function Tests: No results for input(s): TSH, T4TOTAL, FREET4, T3FREE, THYROIDAB in the last 72 hours. Anemia Panel: No results for input(s): VITAMINB12, FOLATE, FERRITIN, TIBC, IRON, RETICCTPCT in the last 72 hours. Urine analysis:    Component Value Date/Time   COLORURINE YELLOW 03/08/2021 2045   APPEARANCEUR HAZY (A) 03/08/2021 2045   APPEARANCEUR Hazy (A) 02/17/2021 1315   LABSPEC 1.020 03/08/2021 2045   LABSPEC 1.012 12/29/2013 1210   PHURINE 6.0 03/08/2021 2045   GLUCOSEU NEGATIVE 03/08/2021 2045   GLUCOSEU >=500 12/29/2013 1210   HGBUR NEGATIVE 03/08/2021 2045   HGBUR negative 04/14/2010 1435   BILIRUBINUR NEGATIVE 03/08/2021 2045   BILIRUBINUR Negative 02/17/2021 1315   BILIRUBINUR Negative 12/29/2013 [place] 03/08/2021 2045   PROTEINUR 30 (A) 03/08/2021 2045   UROBILINOGEN 0.2 04/18/2020 1326   UROBILINOGEN negative 04/14/2010 1435   NITRITE POSITIVE (A) 03/08/2021 2045   LEUKOCYTESUR SMALL (A) 03/08/2021 2045   LEUKOCYTESUR Negative 12/29/2013 1210    Family Communication: Son at bedside  Disposition: Status is: Inpatient Remains inpatient appropriate because: Awaiting insurance authorization for CIR  as recommended by PT, OT. Called for peer-to-peer this morning at 11:25am, awaiting call back.  Planned Discharge Destination: Rehab      Patrecia Pour, MD 03/19/2021 3:14 PM Page by [email or web address]

## 2021-03-19 NOTE — Progress Notes (Cosign Needed)
Patient suffers from stroke which impairs their ability to perform daily activities like bathing, dressing, grooming, and toileting in the home.  A walker will not resolve  issue with performing activities of daily living. A wheelchair will allow patient to safely perform daily activities. Patient is not able to propel themselves in the home using a standard weight wheelchair due to general weakness. Patient can self propel in the lightweight wheelchair. Length of need 6 months .  Accessories: elevating leg rests (ELRs), wheel locks, extensions and anti-tippers.

## 2021-03-19 NOTE — Plan of Care (Signed)

## 2021-03-19 NOTE — Consult Note (Signed)
Brief Psychiatry Consult Note  The patient was last seen by the psychiatry service on 2/13. Interim documentation by primary team and nursing staff has been reviewed. At this time, patient is alert, oriented, engaging in care and there is no evidence of acute psychiatric disturbance requiring ongoing psychiatric consultation.  On examination today, she continues to deny SI. Mood was hopeful that she will get into CIR after peer review. Thinks it will be helpful for mood as large part of decline from loss of independence and depending on son. She was fully oriented and able to do DOWB with no issues. Continued to have v mild perseveration (returned to DOWB after ~1 min). Notes that she has been sleeping very well and appetite significantly improved after initiation of Remeron, generally denied side effects and specifically denied any lightheadedness or dizziness.    Please see last consult note for full assessment. Final medication recommendations are as follows:  - c effexor 37.5 twice daily, can dc effexor and resume pristiq at dc - c remeron 7.5 QHS  We will sign off at this time. This has been communicated to the primary team. If issues arise in the future, don't hesitate to reconsult the Psychiatry Inpatient Consult Service.   Oliverio Cho A Patrecia Veiga

## 2021-03-19 NOTE — Progress Notes (Signed)
Inpatient Diabetes Program Recommendations  AACE/ADA: New Consensus Statement on Inpatient Glycemic Control (2015)  Target Ranges:  Prepandial:   less than 140 mg/dL      Peak postprandial:   less than 180 mg/dL (1-2 hours)      Critically ill patients:  140 - 180 mg/dL   Lab Results  Component Value Date   GLUCAP 107 (H) 03/19/2021   HGBA1C 5.6 03/09/2021    Review of Glycemic Control  Diabetes history: DM2 Outpatient Diabetes medications: None Current orders for Inpatient glycemic control: Novolog 0-15 units TID with meals  Had hypo this am after given 5 units of Novolog   Inpatient Diabetes Program Recommendations:    Decrease Novolog to 0-9 units TID   Continue to follow glucose trends.   Thank you. Lorenda Peck, RD, LDN, CDE Inpatient Diabetes Coordinator [phone]

## 2021-03-20 ENCOUNTER — Other Ambulatory Visit (HOSPITAL_COMMUNITY): Payer: Self-pay

## 2021-03-20 LAB — GLUCOSE, CAPILLARY
Glucose-Capillary: 152 mg/dL — ABNORMAL HIGH (ref 70–99)
Glucose-Capillary: 161 mg/dL — ABNORMAL HIGH (ref 70–99)

## 2021-03-20 MED ORDER — LEVETIRACETAM 1000 MG PO TABS
1000.0000 mg | ORAL_TABLET | Freq: Two times a day (BID) | ORAL | 0 refills | Status: DC
  Filled 2021-03-20: qty 60, 30d supply, fill #0

## 2021-03-20 MED ORDER — LACOSAMIDE 200 MG PO TABS
200.0000 mg | ORAL_TABLET | Freq: Two times a day (BID) | ORAL | 0 refills | Status: DC
  Filled 2021-03-20: qty 60, 30d supply, fill #0

## 2021-03-20 MED ORDER — HYDRALAZINE HCL 25 MG PO TABS
25.0000 mg | ORAL_TABLET | Freq: Three times a day (TID) | ORAL | Status: DC
Start: 2021-03-20 — End: 2021-03-20

## 2021-03-20 MED ORDER — MIRTAZAPINE 7.5 MG PO TABS
7.5000 mg | ORAL_TABLET | Freq: Every day | ORAL | 0 refills | Status: DC
Start: 2021-03-20 — End: 2021-04-24
  Filled 2021-03-20: qty 30, 30d supply, fill #0

## 2021-03-20 NOTE — Plan of Care (Signed)

## 2021-03-20 NOTE — Progress Notes (Addendum)
Physical Therapy Treatment Patient Details Name: [name] MRN: [id number] DOB: Jul 19, 1970 Today's Date: 03/20/2021   History of Present Illness 51 y.o. female, who presented to the [facility] ED as a transfer from [facility] ED with a chief complaint of AMS. She has a pertinent past medical history of subclinical status epilepticus, CAD, DM2, CKD, GIB, CVA with left sided weakness, hypothyroidism.    PT Comments    Pt reports d/c home today with support of family, wants to practice functional mobility tasks including short-distance gait and stand pivot transfers for in/out of bed at home. Overall pt at min assist level for steadying and mod verbal cuing for safety, pt tends to let go of RW when fatigued and unsafely navigate RW.   BP 139/94, HR 87 bpm once initially on BSC. Pt developed hypotensive event on BSC, + diaphoresis, feeling "hot", BP 89/60. Pt returned to bed with +2 assist, placed in trendelenburg for recovery of BP 108/64. RN aware, who contacted MD.   PT strongly encouraged transfer-level mobility for bathroom and in/out of wheelchair at home, discouraged ambulation to/from bathroom given urgency and safety deficits especially when needs to use the bathroom. PT instead recommended multiple short bouts of gait with support of family followed by rest breaks, gait belt administered. Pt understands transfer-level mobility is more important than gait training at the moment, for pt safety. Will have HHPT progress mobility. Of note, pt has 3 steps to enter mother's house. PT administered handout reviewing bump up/down steps with use of wheeelchair, will have son and brother-in-law to assist her into the house using this method.      Recommendations for follow up therapy are one component of a multi-disciplinary discharge planning process, led by the attending physician.  Recommendations may be updated based on patient status, additional functional criteria and insurance  authorization.  Follow Up Recommendations  Home health PT     Assistance Recommended at Discharge Frequent or constant Supervision/Assistance  Patient can return home with the following Assistance with cooking/housework;Assistance with feeding;Direct supervision/assist for medications management;Direct supervision/assist for financial management;Assist for transportation;Help with stairs or ramp for entrance;A little help with walking and/or transfers;A little help with bathing/dressing/bathroom   Equipment Recommendations  Wheelchair cushion (measurements PT);Wheelchair (measurements PT)    Recommendations for Other Services       Precautions / Restrictions Precautions Precautions: Fall Precaution Comments: seizures Restrictions Weight Bearing Restrictions: No     Mobility  Bed Mobility Overal bed mobility: Needs Assistance Bed Mobility: Supine to Sit, Sit to Supine     Supine to sit: Min assist Sit to supine: Mod assist, +2 for physical assistance   General bed mobility comments: min assist for completion of trunk rise and steadying EOB. Mod +2 to return to supine for LE and trunk management, boost up in bed during hypotensive event.    Transfers Overall transfer level: Needs assistance Equipment used: Rolling walker (2 wheels) Transfers: Bed to chair/wheelchair/BSC, Sit to/from Stand Sit to Stand: Min assist Stand pivot transfers: Min assist         General transfer comment: assist for rise, steadying. STS x3, from EOB x2 and BSC x1.    Ambulation/Gait Ambulation/Gait assistance: Min assist Gait Distance (Feet): 20 Feet Assistive device: Rolling walker (2 wheels) Gait Pattern/deviations: Step-through pattern, Decreased stride length, Trunk flexed Gait velocity: decr     General Gait Details: assist for steadying, RW management, VC for placement in RW and upright posture.   Stairs  Wheelchair Mobility    Modified Rankin (Stroke  Patients Only)       Balance Overall balance assessment: Needs assistance Sitting-balance support: Feet supported Sitting balance-Leahy Scale: Fair     Standing balance support: Bilateral upper extremity supported Standing balance-Leahy Scale: Poor Standing balance comment: reliant on external support                            Cognition Arousal/Alertness: Awake/alert Behavior During Therapy: Anxious Overall Cognitive Status: Impaired/Different from baseline Area of Impairment: Attention, Safety/judgement, Awareness, Problem solving, Following commands                   Current Attention Level: Sustained   Following Commands: Follows multi-step commands inconsistently, Follows one step commands consistently Safety/Judgement: Decreased awareness of safety, Decreased awareness of deficits Awareness: Intellectual Problem Solving: Decreased initiation, Difficulty sequencing, Requires verbal cues, Slow processing General Comments: slowed processing especially with fatigue, when fatigued pt requires step-by-step cues for safety and form.        Exercises      General Comments General comments (skin integrity, edema, etc.): BP 139/94, HR 87 bpm once initially on BSC. Pt developed hypotensive event on BSC, + diaphoresis, feeling "hot", BP 89/60. Pt returned to bed with +2 assist, placed in trendelenburg for recovery of BP 108/64      Pertinent Vitals/Pain Pain Assessment Pain Assessment: Faces Faces Pain Scale: Hurts little more Pain Location: LLE>RLE Pain Descriptors / Indicators: Aching, Sore Pain Intervention(s): Limited activity within patient's tolerance, Monitored during session, Repositioned    Home Living                          Prior Function            PT Goals (current goals can now be found in the care plan section) Acute Rehab PT Goals PT Goal Formulation: With patient Time For Goal Achievement: 03/28/21 Potential to Achieve  Goals: Fair Progress towards PT goals: Progressing toward goals    Frequency    Min 3X/week      PT Plan Current plan remains appropriate    Co-evaluation              AM-PAC PT "6 Clicks" Mobility   Outcome Measure  Help needed turning from your back to your side while in a flat bed without using bedrails?: A Little Help needed moving from lying on your back to sitting on the side of a flat bed without using bedrails?: A Little Help needed moving to and from a bed to a chair (including a wheelchair)?: A Little Help needed standing up from a chair using your arms (e.g., wheelchair or bedside chair)?: A Little Help needed to walk in hospital room?: A Little Help needed climbing 3-5 steps with a railing? : A Lot 6 Click Score: 17    End of Session Equipment Utilized During Treatment: Gait belt Activity Tolerance: Patient limited by fatigue;Other (comment) (hypotensive event) Patient left: in bed;with call bell/phone within reach;with bed alarm set Nurse Communication: Mobility status PT Visit Diagnosis: Unsteadiness on feet (R26.81);Other abnormalities of gait and mobility (R26.89);Muscle weakness (generalized) (M62.81);Other symptoms and signs involving the nervous system (R29.898);Difficulty in walking, not elsewhere classified (R26.2)     Time: 5176-1607 PT Time Calculation (min) (ACUTE ONLY): 33 min  Charges:  $Gait Training: 8-22 mins $Therapeutic Activity: 8-22 mins  Stacie Glaze, PT DPT Acute Rehabilitation Services Pager [phone]  Office [phone]    [place] 03/20/2021, 10:12 AM

## 2021-03-20 NOTE — Discharge Summary (Addendum)
Physician Discharge Summary   Patient: [name] MRN: [id number] DOB: 1970/05/28  Admit date:     03/08/2021  Discharge date: 03/20/21  Discharge Physician: Patrecia Pour   PCP: [name], MD   Recommendations at discharge:  Follow up with neurology after discharge. Note AED regimen below.  Discharge Diagnoses: Active Problems:   Altered mental status   Malnutrition of moderate degree   Urinary tract infection without hematuria   Hypertension associated with diabetes [facility])   Hypertensive emergency  Hospital Course: Patient is a 51 year old female with past medical history of status epilepticus, coronary artery disease, diabetes type 2, CKD, GI bleed, CVA with left-sided weakness, hypothyroidism who presented to [facility] initially with complaints of altered mental status.  On presentation, he was also suspicious for UTI and was started on antibiotics.  CT head did not show any acute intracranial abnormalities.  She was thought to have status epilepticus and was transferred to [facility] for continuous EEG.  PCCM was consulted after admission.  Neurology, PCCM were following here.  Transferred to [facility] service on 2/10.  Mental status has significantly improved and she is currently alert and oriented.  PT/OT recommending acute inpatient rehab.  Patient also endorsed suicidal ideation so psychiatry consulted, on safety sitter but was subsequently cleared by psychiatry as her mentation improved and mood normalized. Seizure activity has durably abated with AED titration. Her deconditioning will require rehabilitation for which CIR placement was sought but denied by insurance. The patient and family will return home in lieu of our recommendation for SNF.  Assessment and Plan: Altered mental status: Metabolic encephalopathy secondary to suspected non convulsive status epilepticus. CT head imaging was negative for intracranial abnormalities.MRI showed persistent foci of ill-defined diffusion  hyperintensity in the posterior left frontal centrum semiovale probably reflecting subacute to chronic infarcts. No acute or new abnormality. - Her mental status has significantly improved and she is alert and oriented mostly.    Status epilepticus: Transferred to [facility] for continuous EEG.  Neurology following.  Video EEG showed diffuse slowing with continuous high-voltage spiky discharges indicative of widespread cerebral disturbance with epileptogenic potential.  Concern for subclinical seizures. Continuous EEG stopped on 2/8. Repeat EEG did not show seizure activity. - Continue Vimpat, increased to 200mg  BID from 50mg  BID - Continue keppra, decreased to 1,000mg  BID from 1500mg  BID   Suicidal ideation: Family stress, concerned about burden on her son.  - SI resolved. Restart home meds and will continue newly started remeron which helped symptoms significantly.     Dysphagia: Improving with improved mentation.  - Recommend to continue with cardiac/carb-modified dysphagia 3 diet   UTI: UA was suspicious for urinary tract infection.  Urine cultures showed Enterobacter cloacae, completed treatment with bactrim.   Diabetes type 2: Well-controlled with HbA1c 5.6%. - No change to home regimen.     Hypertension: Remains hypertensive.  On  carvedilol, isosorbide, hydralazine.  Monitor blood pressure.  Added amlodipine   AKI on CKD stage IIIb: Baseline creatinine fluctuates from 1.3-1.8.  She follows with Dr. Zollie Scale at [facility].  Kidney function at baseline.IV fluids d/ced   Hypothyroidism: On Synthroid   History of coronary artery disease: No anginal symptoms.  On aspirin, Plavix, statin at home which we will continue   Normocytic anemia: Currently hemoglobin stable in the range of 10.  Has mild thrombocytopenia, continue monitoring CBC intermittently   Debility/deconditioning: PT/OT recommending CIR on discharge which has been declined by insurance. Family has arranged  significant assistance at home and declines offer for SNF at discharge. HH ordered with wheelchair.   Demyelinating disorder:  - Continue follow up with outpatient neurology   Moderate Malnutrition: Supp as able  Consultants: Neurology, psychiatry Procedures performed: Video EEG  Disposition: Home Diet recommendation:  Discharge Diet Orders (From admission, onward)     Start     Ordered   03/20/21 0000  Diet - low sodium heart healthy       Comments: Mechanical soft diet   03/20/21 0652           Dysphagia type 3 thin Liquid  DISCHARGE MEDICATION: Allergies as of 03/20/2021       Reactions   Lipitor [atorvastatin] Other (See Comments)   Leg pains        Medication List     STOP taking these medications    traZODone 100 MG tablet Commonly known as: DESYREL       TAKE these medications    Aimovig 140 MG/ML Soaj Generic drug: Erenumab-aooe Inject 140 mg into the skin every 28 (twenty-eight) days.   ALPRAZolam 1 MG tablet Commonly known as: XANAX Take 1 tablet by mouth twice daily What changed:  when to take this reasons to take this   aspirin 81 MG EC tablet Take 1 tablet (81 mg total) by mouth daily. Swallow whole.   Baclofen 5 MG Tabs Take 5 mg by mouth 2 (two) times daily as needed (muscle spasms).   carvedilol 12.5 MG tablet Commonly known as: COREG Take 1 tablet by mouth twice daily What changed:  how to take this when to take this   clopidogrel 75 MG tablet Commonly known as: PLAVIX Take 1 tablet (75 mg total) by mouth daily.   desvenlafaxine 50 MG 24 hr tablet Commonly known as: PRISTIQ Take 50 mg by mouth daily.   diclofenac Sodium 1 % Gel Commonly known as: VOLTAREN Apply 2 g topically 4 (four) times daily as needed (joint paim).   diphenoxylate-atropine 2.5-0.025 MG tablet Commonly known as: Lomotil Take 1 tablet by mouth 4 (four) times daily as needed for diarrhea or loose stools.   estradiol 0.1 MG/GM vaginal  cream Commonly known as: ESTRACE Place 1 Applicatorful vaginally 3 (three) times a week. Estrogen Cream Instruction Discard applicator Apply pea sized amount to tip of finger to urethra before bed. Wash hands well after application. Use Monday, Wednesday and Friday   furosemide 20 MG tablet Commonly known as: LASIX Take 1 tablet by mouth every day as needed for swelling What changed: See the new instructions.   gabapentin 300 MG capsule Commonly known as: NEURONTIN Take 2 capsules (600 mg total) by mouth 2 (two) times daily.   glucose 4 GM chewable tablet Chew 1 tablet (4 g total) by mouth 3 (three) times daily as needed for low blood sugar. Home med.   hydrALAZINE 50 MG tablet Commonly known as: APRESOLINE Take 1 tablet (50 mg total) by mouth 3 (three) times daily.   isosorbide mononitrate 120 MG 24 hr tablet Commonly known as: IMDUR Take 1 tablet by mouth every day   lacosamide 200 MG Tabs tablet Commonly known as: VIMPAT Take 1 tablet (200 mg total) by mouth 2 (two) times daily. What changed:  medication strength how much to take   levETIRAcetam 1000 MG tablet Commonly known as: KEPPRA Take 1 tablet (1,000 mg total) by mouth 2 (two) times daily. What changed:  medication strength how much to take   levothyroxine 25 MCG tablet  Commonly known as: SYNTHROID Take 1 tablet by mouth 30 minutes before breakfast What changed: See the new instructions.   mirabegron ER 50 MG Tb24 tablet Commonly known as: MYRBETRIQ Take 1 tablet (50 mg total) by mouth daily.   mirtazapine 7.5 MG tablet Commonly known as: REMERON Take 1 tablet (7.5 mg total) by mouth at bedtime.   nitrofurantoin 100 MG capsule Commonly known as: Macrodantin Hold while on IV antibiotic. What changed:  how much to take how to take this when to take this additional instructions   nitroGLYCERIN 0.4 MG SL tablet Commonly known as: NITROSTAT Dissolve 1 tablet under tongue every 60mins, up to 3 doses,  for chest pain. Max 3 doses in 52mins. What changed: See the new instructions.   Nurtec 75 MG Tbdp Generic drug: Rimegepant Sulfate Take 75 mg by mouth as needed. What changed: reasons to take this   pantoprazole 40 MG tablet Commonly known as: PROTONIX Take 1 tablet (40 mg total) by mouth 2 (two) times daily.   promethazine 25 MG tablet Commonly known as: PHENERGAN Take 1 tablet (25 mg total) by mouth every 6 (six) hours as needed for nausea or vomiting.   rosuvastatin 40 MG tablet Commonly known as: CRESTOR Take 1 tablet (40 mg total) by mouth daily at 6 PM.   SYSTANE OP Place 1 drop into both eyes daily as needed (dry eyes).               Durable Medical Equipment  (From admission, onward)           Start     Ordered   03/19/21 1543  For home use only DME lightweight manual wheelchair with seat cushion  Once       Comments: Patient suffers from stroke which impairs their ability to perform daily activities like bathing, dressing, grooming, and toileting in the home.  A walker will not resolve  issue with performing activities of daily living. A wheelchair will allow patient to safely perform daily activities. Patient is not able to propel themselves in the home using a standard weight wheelchair due to general weakness. Patient can self propel in the lightweight wheelchair. Length of need 6 months . Accessories: elevating leg rests (ELRs), wheel locks, extensions and anti-tippers.   03/19/21 1543            Follow-up Information     Caryn Section Kirstie Peri, MD Follow up.   Specialty: Family Medicine Contact information: [address] [place] [address] [phone]         Minna Merritts, MD .   Specialty: Cardiology Contact information: [place] [address] [phone]         Vladimir Crofts, MD Follow up.   Specialty: Neurology Contact information: [place] [facility] [place] [address] [phone]         Marcial Pacas, MD .   Specialty: Neurology Contact information: [address] [place] [address] [phone]                Subjective: No new issues, eating better, feeling stronger and wants to go home.  Discharge Exam: Filed Weights   03/11/21 0500 03/14/21 0500 03/16/21 0503  Weight: 73.7 kg 74.3 kg 71.9 kg  Alert, oriented 51yo F diffusely weak without and new focal deficit.   Condition at discharge: stable  The results of significant diagnostics from this hospitalization (including imaging, microbiology, ancillary  and laboratory) are listed below for reference.   Imaging Studies: DG Abd 1 View  Result Date: 03/09/2021 CLINICAL DATA:  NG tube placement EXAM: ABDOMEN - 1 VIEW COMPARISON:  12/05/2020 FINDINGS: Nasogastric tube coiled in the gastric remanent. Gaseous distension of the small bowel. No evidence of pneumoperitoneum, portal venous gas or pneumatosis. No pathologic calcifications along the expected course of the ureters. No acute osseous abnormality. IMPRESSION: 1. Nasogastric tube coiled in the gastric remanent. Electronically Signed   By: Kathreen Devoid M.D.   On: 03/09/2021 13:36   CT HEAD WO CONTRAST (5MM)  Result Date: 03/08/2021 CLINICAL DATA:  51 year old female with altered mental status. History of demyelinating disease and prior CVAs. EXAM: CT HEAD WITHOUT CONTRAST TECHNIQUE: Contiguous axial images were obtained from the base of the skull through the vertex without intravenous contrast. RADIATION DOSE REDUCTION: This exam was performed according to the departmental dose-optimization program which includes automated exposure control, adjustment of the mA and/or kV according to patient size and/or use of iterative reconstruction technique. COMPARISON:  02/10/2021 MR, 12/05/2020 head CT and prior studies FINDINGS: Brain: No evidence of acute infarction, hemorrhage, hydrocephalus, extra-axial  collection or mass lesion/mass effect. Chronic white matter disease and small bilateral remote infarcts are unchanged. Mild cerebral volume loss is noted. Vascular: Carotid and vertebral atherosclerotic calcifications are noted. Skull: Normal. Negative for fracture or focal lesion. Sinuses/Orbits: No acute finding. Other: None. IMPRESSION: 1. No evidence of acute intracranial abnormality. 2. Chronic white matter disease and small remote bilateral infarcts. Electronically Signed   By: Margarette Canada M.D.   On: 03/08/2021 15:13   MR BRAIN WO CONTRAST  Result Date: 03/14/2021 CLINICAL DATA:  Mental status change, persistent or worsening EXAM: MRI HEAD WITHOUT CONTRAST TECHNIQUE: Multiplanar, multiecho pulse sequences of the brain and surrounding structures were obtained without intravenous contrast. COMPARISON:  02/10/2021 FINDINGS: Brain: Persistent small ill-defined foci of diffusion hyperintensity without restriction. No new diffusion abnormality. Ventricles and sulci are stable in size and configuration. Confluent areas of T2 hyperintensity are identified in the supratentorial and pontine white matter, nonspecific but probably reflecting advanced chronic microvascular ischemic changes. Chronic left parietal cortical/subcortical infarct. Chronic small vessel infarcts and prominent perivascular spaces of the central gray nuclei and central white matter as well as chronic infarcts of the pons and cerebellum. Minimal punctate foci of susceptibility likely reflecting chronic microhemorrhages. No mass, mass effect, hydrocephalus, or extra-axial collection. Vascular: Major vessel flow voids at the skull base are preserved. Skull and upper cervical spine: Normal marrow signal is preserved. Sinuses/Orbits: Paranasal sinuses are aerated. No acute orbital abnormality. Other: Sella is unremarkable.  Mastoid air cells are clear. IMPRESSION: Persistent foci of ill-defined diffusion hyperintensity in the posterior left frontal  centrum semiovale probably reflecting subacute to chronic infarcts. No acute or new abnormality. Stable chronic findings detailed above. Electronically Signed   By: Macy Mis M.D.   On: 03/14/2021 16:18   DG Chest Port 1 View  Result Date: 03/08/2021 CLINICAL DATA:  Altered mental status, seizure. EXAM: PORTABLE CHEST 1 VIEW COMPARISON:  Chest x-rays dated 02/07/2021 and 03/31/2020. FINDINGS: Study is hypoinspiratory with crowding of the bilateral perihilar bronchovascular markings. Given the low lung volumes, heart size and mediastinal contours are within normal limits and lungs appear clear. Chronic bronchitic changes noted centrally. Osseous structures about the chest are unremarkable. IMPRESSION: Low lung volumes. No active disease. No evidence of pneumonia or pulmonary edema. Electronically Signed   By: Franki Cabot M.D.   On: 03/08/2021 14:44  DG Abd Portable 1V  Result Date: 03/10/2021 CLINICAL DATA:  Feeding tube placement EXAM: PORTABLE ABDOMEN - 1 VIEW COMPARISON:  Previous studies including the examination of 03/09/2021 FINDINGS: Bowel gas pattern is nonspecific. Tip of enteric tube is noted in the mid abdomen. The tip is possibly in the jejunum passing through the gastro jejunal anastomosis. Surgical clips are seen in gallbladder fossa and in the left iliac fossa. IMPRESSION: Nonspecific bowel gas pattern. Distal course of enteric tube is noted in the midline with its tip at L3 level. Findings may suggest tip of feeding tube to be in the jejunum passing through the gastrojejunal anastomosis. Less likely possibility would be extraluminal location of tip of feeding tube. If clinically warranted, repeat radiograph after administration of Gastrografin through the feeding tube may be considered. Electronically Signed   By: Elmer Picker M.D.   On: 03/10/2021 12:17   EEG adult  Result Date: 03/15/2021 Greta Doom, MD     03/15/2021  3:07 PM History: 51 year old female with  persistent encephalopathy and history of status epilepticus Sedation: None Technique: This EEG was acquired with electrodes placed according to the International 10-20 electrode system (including Fp1, Fp2, F3, F4, C3, C4, P3, P4, O1, O2, T3, T4, T5, T6, A1, A2, Fz, Cz, Pz). The following electrodes were missing or displaced: none. Background: The background is relatively disorganized with diffuse and generalized irregular theta and delta range activities.  The frontally predominant sharply contoured waves seen previously are no longer evident.  The posterior dominant rhythm of 8 Hz is only seen briefly, and is poorly sustained. Photic stimulation: Physiologic driving is not performed EEG Abnormalities: 1) high-voltage generalized irregular slow activity Clinical Interpretation: This EEG is consistent with a moderate generalized nonspecific cerebral dysfunction (encephalopathy).  Compared to previous studies, there is some improvement. There was no seizure or seizure predisposition recorded on this study. Please note that lack of epileptiform activity on EEG does not preclude the possibility of epilepsy. [name], MD [facility] [phone] If 7pm- 7am, please page neurology on call as listed in [place].   EEG adult  Result Date: 03/08/2021 Derek Jack, MD     03/08/2021  8:34 PM Routine EEG Report Lada Marita [place] [name] is a 51 y.o. female with a history of status epilepticus who is undergoing an EEG to evaluate for seizures. Report: This EEG was acquired with electrodes placed according to the International 10-20 electrode system (including Fp1, Fp2, F3, F4, C3, C4, P3, P4, O1, O2, T3, T4, T5, T6, A1, A2, Fz, Cz, Pz). The following electrodes were missing or displaced: none. The best background was 6 Hz. This activity is reactive to stimulation. Drowsiness was manifested by background fragmentation; deeper stages of sleep were identified by K complexes and sleep spindles. There was focal  slowing over the left frontotemporal region. There were rare sharp waves over the left temporal region. There were no electrographic seizures identified. Photic stimulation and hyperventilation were not performed. Impression and clinical correlation: This EEG was obtained while awake and asleep and is abnormal due to: - mild diffuse slowing indicative of global cerebral dysfunction - focal slowing over the left frontotemporal region indicating superimposed focal cerebral dysfunction in that region - Rare sharp waves indicate increased epileptogenic potential in the L temporal region Su Monks, MD [facility] [phone] If 7pm- 7am, please page neurology on call as listed in [place].   Overnight EEG with video  Result Date: 03/10/2021 Samuella Cota, MD  03/10/2021  7:52 AM EEG Procedure CPT/Type of Study: 22025; 24hr EEG with video Referring Provider: H[place]ucker Primary Neurological Diagnosis: status epilepticus History: This is a 51 yr old patient, undergoing an EEG to evaluate for status epilepticus. Clinical State: disoriented Technical Description: The EEG was performed using standard setting per the guidelines of American Clinical Neurophysiology Society (ACNS). A minimum of 21 electrodes were placed on scalp according to the International 10-20 or/and 10-10 Systems. Supplemental electrodes were placed as needed. Single EKG electrode was also used to detect cardiac arrhythmia. Patient's behavior was continuously recorded on video simultaneously with EEG. A minimum of 16 channels were used for data display. Each epoch of study was reviewed manually daily and as needed using standard referential and bipolar montages. Computerized quantitative EEG analysis (such as compressed spectral array analysis, trending, automated spike & seizure detection) were used as indicated. Day 2: from 0815 03/09/21 to 0730 03/10/21 EEG Description: Overall Amplitude:Normal Predominant Frequency: The background activity  showed theta , with about 4-6 Hz, that was frequent. Superimposed Frequencies: frequent delta and some beta activity bilaterally The background was symmetric Background Abnormalities: Generalized slowing; delta-theta slowing as above Rhythmic or periodic pattern: Generalized periodic discharges; continuous 1-2Hz  spiky GPDs, triphasic appearance at times Epileptiform activity: Yes; continuous high voltage spiky 1-2Hz  GPDs with epileptiform morphology Electrographic seizures: Yes; some subtle brief ictal evolution to GPDs with 3-4hz  in short runs Events: no Breach rhythm: no Reactivity: Present Stimulation procedures: Hyperventilation: not done Photic stimulation: not done Sleep Background: Stage II EKG:no significant arrhythmia Impression: This was a markedly abnormal continuous video EEG due to diffuse slowing with continuous high voltage spiky GPDs, indicative of a widespread cerebral disturbance with epileptogenic potential. There was a transient improvement in morphology and frequency in the afternoon as medications were increased, however overnight there were some brief runs of increased 4hz  runs of spikes concerning for subclinical seizures.   Overnight EEG with video  Result Date: 03/09/2021 Samuella Cota, MD     03/09/2021  8:15 AM EEG Procedure CPT/Type of Study: 42706; 24hr EEG with video Referring Provider: Hunsucker Primary Neurological Diagnosis: status epilepticus History: This is a 51 yr old patient, undergoing an EEG to evaluate for status epilepticus. Clinical State: disoriented Technical Description: The EEG was performed using standard setting per the guidelines of American Clinical Neurophysiology Society (ACNS). A minimum of 21 electrodes were placed on scalp according to the International 10-20 or/and 10-10 Systems. Supplemental electrodes were placed as needed. Single EKG electrode was also used to detect cardiac arrhythmia. Patient's behavior was continuously recorded on video simultaneously  with EEG. A minimum of 16 channels were used for data display. Each epoch of study was reviewed manually daily and as needed using standard referential and bipolar montages. Computerized quantitative EEG analysis (such as compressed spectral array analysis, trending, automated spike & seizure detection) were used as indicated. Day 1: from 2015 03/08/21 to 0815 03/09/21 EEG Description: Overall Amplitude:Normal Predominant Frequency: The background activity showed theta , with about 4-6 Hz, that was frequent. Superimposed Frequencies: frequent delta and some beta activity bilaterally The background was symmetric Background Abnormalities: Generalized slowing; delta-theta slowing as above Rhythmic or periodic pattern: Generalized periodic discharges; continuous 1-2Hz  spiky GPDs, triphasic appearance at times Epileptiform activity: Yes; continuous high voltage spiky 1-2Hz  GPDs with epileptiform morphology Electrographic seizures: Unclear; continuous 1-2Hz  high voltage spiky GPDs as above but without discrete seizures Events: no Breach rhythm: no Reactivity: Present Stimulation procedures: Hyperventilation: not done Photic stimulation: not done Sleep Background:  Stage II EKG:no significant arrhythmia Impression: This was a markedly abnormal continuous video EEG due to diffuse slowing with continuous high voltage spiky GPDs, indicative of a widespread cerebral disturbance with epileptogenic potential. Though no discrete seizures were seen, a trial of increased AED treatment is advised to evaluate for electrographic and clinical improvement.   VAS [place] UPPER EXTREMITY VENOUS DUPLEX  Result Date: 03/12/2021 UPPER VENOUS STUDY  Patient Name:  Black Jack  Date of Exam:   03/12/2021 Medical Rec #: [id number]              Accession #:    [id number] Date of Birth: [birth year]              Patient Gender: F Patient Age:   57 years Exam Location[facility]  [facility] Procedure:      VAS [place] UPPER EXTREMITY VENOUS DUPLEX  Referring Phys: Leslye Peer --------------------------------------------------------------------------------  Indications: Swelling Comparison Study: no prior Performing Technologist: Archie Patten RVS  Examination Guidelines: A complete evaluation includes B-mode imaging, spectral Doppler, color Doppler, and power Doppler as needed of all accessible portions of each vessel. Bilateral testing is considered an integral part of a complete examination. Limited examinations for reoccurring indications may be performed as noted.  Right Findings: +----------+------------+---------+-----------+----------+-------+  RIGHT      Compressible Phasicity Spontaneous Properties Summary  +----------+------------+---------+-----------+----------+-------+  Subclavian     Full        Yes        Yes                         +----------+------------+---------+-----------+----------+-------+  Left Findings: +----------+------------+---------+-----------+----------+-------+  LEFT       Compressible Phasicity Spontaneous Properties Summary  +----------+------------+---------+-----------+----------+-------+  IJV            Full        Yes        Yes                         +----------+------------+---------+-----------+----------+-------+  Subclavian     Full        Yes        Yes                         +----------+------------+---------+-----------+----------+-------+  Axillary       Full        Yes        Yes                         +----------+------------+---------+-----------+----------+-------+  Brachial       Full        Yes        Yes                         +----------+------------+---------+-----------+----------+-------+  Radial         Full                                               +----------+------------+---------+-----------+----------+-------+  Ulnar          Full                                               +----------+------------+---------+-----------+----------+-------+  Cephalic       Full                                                +----------+------------+---------+-----------+----------+-------+  Basilic        Full                                               +----------+------------+---------+-----------+----------+-------+  Summary:  Right: No evidence of thrombosis in the subclavian.  Left: No evidence of deep vein thrombosis in the upper extremity. No evidence of superficial vein thrombosis in the upper extremity.  *See table(s) above for measurements and observations.  Diagnosing physician: Harold Barban MD Electronically signed by Harold Barban MD on 03/12/2021 at 9:00:41 PM.    Final    [place] EKG SITE RITE  Result Date: 03/12/2021 If Site Rite image not attached, placement could not be confirmed due to current cardiac rhythm.   Microbiology: Results for orders placed or performed during the hospital encounter of 03/08/21  Urine Culture     Status: Abnormal   Collection Time: 03/08/21  8:45 PM   Specimen: Urine, Clean Catch  Result Value Ref Range Status   Specimen Description URINE, CLEAN CATCH  Final   Special Requests   Final    ADDED 2311 Performed at [facility], [place] [address], [place], [place] [address]    Culture >=100,000 COLONIES/mL ENTEROBACTER CLOACAE (A)  Final   Report Status 03/11/2021 FINAL  Final   Organism ID, Bacteria ENTEROBACTER CLOACAE (A)  Final      Susceptibility   Enterobacter cloacae - MIC*    CEFAZOLIN >=64 RESISTANT Resistant     CEFEPIME 8 INTERMEDIATE Intermediate     CIPROFLOXACIN 1 RESISTANT Resistant     GENTAMICIN <=1 SENSITIVE Sensitive     IMIPENEM 0.5 SENSITIVE Sensitive     NITROFURANTOIN >=512 RESISTANT Resistant     TRIMETH/SULFA <=20 SENSITIVE Sensitive     PIP/TAZO >=128 RESISTANT Resistant     * >=100,000 COLONIES/mL ENTEROBACTER CLOACAE  MRSA Next Gen by PCR, Nasal     Status: None   Collection Time: 03/09/21 12:25 AM   Specimen: Nasal Mucosa; Nasal Swab  Result Value Ref Range Status   MRSA by PCR Next Gen NOT DETECTED NOT  DETECTED Final    Comment: (NOTE) The GeneXpert MRSA Assay (FDA approved for NASAL specimens only), is one component of a comprehensive MRSA colonization surveillance program. It is not intended to diagnose MRSA infection nor to guide or monitor treatment for MRSA infections. Test performance is not FDA approved in patients less than 66 years old. Performed at [facility], [place] [address], [place], [place] [address]    *Note: Due to a large number of results and/or encounters for the requested time period, some results have not been displayed. A complete set of results can be found in Results Review.    Labs: CBC: Recent Labs  Lab 03/13/21 0738 03/14/21 0118  WBC 9.8 9.6  HGB 11.6* 10.7*  HCT 35.6* 31.7*  MCV 84.0 83.2  PLT 166 329*   Basic Metabolic Panel: Recent Labs  Lab 03/13/21 0738 03/14/21 0118 03/15/21 0227 03/16/21 0424 03/17/21 0707 03/18/21 0906  NA 135 132*  135 135 136 140  K 3.9 4.5 5.1 4.5 4.4 3.8  CL 101 100 101 99 100 111  CO2 26 24 25 26 26 22   GLUCOSE 152* 123* 105* 118* 131* 154*  BUN 32* 33* 28* 24* 25* 22*  CREATININE 1.83* 1.94* 2.02* 1.86* 2.16* 1.78*  CALCIUM 8.3* 8.1* 8.5* 8.9 8.8* 7.8*  MG 2.5* 2.5*  --   --   --   --    Liver Function Tests: No results for input(s): AST, ALT, ALKPHOS, BILITOT, PROT, ALBUMIN in the last 168 hours. CBG: Recent Labs  Lab 03/19/21 0754 03/19/21 1100 03/19/21 1547 03/19/21 2105 03/20/21 0625  GLUCAP 107* 197* 223* 160* 152*    Discharge time spent: greater than 30 minutes.  Signed: Patrecia Pour, MD [facility] 03/20/2021

## 2021-03-20 NOTE — Care Management Important Message (Signed)
Important Message  Patient Details  Name: [name] MRN: [id number] Date of Birth: 19-Feb-1970   Medicare Important Message Given:  Yes Patient left prior to IM delivery will mail IM to the patient home address.     Isra Lindy 03/20/2021, 3:24 PM

## 2021-03-20 NOTE — Progress Notes (Signed)
Discharge instructions (including medications) discussed with and copy provided to patient/caregiver. Wheelchair and meds delivered to patient. All belongings sent with patient.

## 2021-03-20 NOTE — TOC Transition Note (Signed)
Transition of Care [facility]) - CM/SW Discharge Note   Patient Details  Name: [name] MRN: [id number] Date of Birth: 07-Aug-1970  [facility] CM/SW Contact:  Pollie Friar, RN Phone Number: 03/20/2021, 10:48 AM   Clinical Narrative:    Patient is discharging to her mothers home. Son to provide needed transportation.  Wheelchair for home to be delivered to the room per [employer].  TOC pharmacy to deliver medications for home to the room.    Final next level of care: Home w Home Health Services Barriers to Discharge: No Barriers Identified   Patient Goals and CMS Choice   CMS Medicare.gov Compare Post Acute Care list provided to:: Patient Represenative (must comment) Choice offered to / list presented to : Adult Children  Discharge Placement                       Discharge Plan and Services   Discharge Planning Services: CM Consult Post Acute Care Choice: Durable Medical Equipment, Home Health          DME Arranged: Lightweight manual wheelchair with seat cushion DME Agency: [employer] Date DME Agency Contacted: 03/20/21   Representative spoke with at DME Agency: Naomi [place]: PT, OT, Social Work, Nurse's Aide   Date [place]: 03/20/21   Representative spoke with at [place]: [place] ([place]) Interventions     Readmission Risk Interventions Readmission Risk Prevention Plan 11/07/2019 10/18/2019 09/05/2019  Transportation Screening Complete Complete Complete  Medication Review Press photographer) Complete Complete Complete  PCP or Specialist appointment within 3-5 days of discharge (No Data) Complete Complete  HRI or Home Care Consult Complete Complete -  SW Recovery Care/Counseling Consult - Complete Complete  Palliative Care Screening Not Applicable Not Applicable Not [place] Not Applicable Not Applicable Not Applicable  Some recent data might be hidden

## 2021-03-24 ENCOUNTER — Telehealth: Payer: Self-pay | Admitting: Family Medicine

## 2021-03-24 ENCOUNTER — Telehealth: Payer: Self-pay

## 2021-03-24 NOTE — Telephone Encounter (Signed)
Copied from [place] [id number]. Topic: General - Other >> Mar 24, 2021 10:45 AM Oneta Rack wrote: Apquiles RN from [place] phone # [phone] states carvedilol (COREG) 12.5 MG tablet interacts with Nurtec 75 MG (this medication was prescribed upon discharged from [facility]) would like a follow up call or can fax any changes to [phone]

## 2021-03-24 NOTE — Telephone Encounter (Addendum)
Transition Care Management Unsuccessful Follow-up Telephone Call  Date of discharge and from where:  [place] 03-20-21 Dx: HF  Attempts:  1st Attempt  Reason for unsuccessful TCM follow-up call:  Left voice message  Transition Care Management Follow-up Telephone Call Date of discharge and from where: [place] 03-20-21 Dx: HF How have you been since you were released from the hospital? DOING OK  Any questions or concerns? No  Items Reviewed: Did the pt receive and understand the discharge instructions provided? Yes  Medications obtained and verified? Yes  Other? No  Any new allergies since your discharge? No  Dietary orders reviewed? Yes Do you have support at home? Yes   Home Care and Equipment/Supplies: Were home health services ordered? Yes PT/RN/aide/ OT If so, what is the name of the agency? [facility]  Has the agency set up a time to come to the patient's home? yes Were any new equipment or medical supplies ordered?  Yes: wheelchair What is the name of the medical supply agency? Hospital  Were you able to get the supplies/equipment? no Do you have any questions related to the use of the equipment or supplies? No  Functional Questionnaire: (I = Independent and D = Dependent) ADLs: D  Bathing/Dressing- D  Meal Prep- D  Eating- I  Maintaining continence- I  Transferring/Ambulation- D  Managing Meds- I  Follow up appointments reviewed:  PCP Hospital f/u appt confirmed? Yes  Scheduled to see Dr Thedore Mins on 03-31-21 @ 1040am. [place] [facility] f/u appt confirmed? No . Are transportation arrangements needed? No  If their condition worsens, is the pt aware to call PCP or go to the Emergency Dept.? Yes Was the patient provided with contact information for the PCP's office or ED? Yes Was to pt encouraged to call back with questions or concerns? Yes

## 2021-03-27 NOTE — Telephone Encounter (Signed)
Attempted to call : Apquiles RN from [place] phone # [phone] with provider response. Left message to call office.

## 2021-03-27 NOTE — Telephone Encounter (Signed)
I'm not aware of any interaction between these two medications. I suggest she contact the pharmacist and address it with the prescribing providers. I don't prescribe either of these medications to the patient.

## 2021-03-27 NOTE — Telephone Encounter (Signed)
Tried returning nurse  Apquiles call. Left message to call back. OK for [facility] triage to advise.

## 2021-03-28 DIAGNOSIS — Z87440 Personal history of urinary (tract) infections: Secondary | ICD-10-CM

## 2021-03-28 DIAGNOSIS — N1832 Chronic kidney disease, stage 3b: Secondary | ICD-10-CM | POA: Diagnosis not present

## 2021-03-28 DIAGNOSIS — I69354 Hemiplegia and hemiparesis following cerebral infarction affecting left non-dominant side: Secondary | ICD-10-CM | POA: Diagnosis not present

## 2021-03-28 DIAGNOSIS — G40911 Epilepsy, unspecified, intractable, with status epilepticus: Secondary | ICD-10-CM | POA: Diagnosis not present

## 2021-03-28 DIAGNOSIS — Z7902 Long term (current) use of antithrombotics/antiplatelets: Secondary | ICD-10-CM

## 2021-03-28 DIAGNOSIS — E039 Hypothyroidism, unspecified: Secondary | ICD-10-CM

## 2021-03-28 DIAGNOSIS — Z79899 Other long term (current) drug therapy: Secondary | ICD-10-CM

## 2021-03-28 DIAGNOSIS — Z9181 History of falling: Secondary | ICD-10-CM

## 2021-03-28 DIAGNOSIS — E1122 Type 2 diabetes mellitus with diabetic chronic kidney disease: Secondary | ICD-10-CM | POA: Diagnosis not present

## 2021-03-28 NOTE — Telephone Encounter (Signed)
Apquiles, RN form [facility] called and advised of the message below from Dr. Caryn Section. He says he will make a note of it. The only reason he called was that as he was doing a medication reconciliation, a notification appeared and their protocol is to let the PCP know.

## 2021-03-31 ENCOUNTER — Ambulatory Visit (INDEPENDENT_AMBULATORY_CARE_PROVIDER_SITE_OTHER): Payer: Medicare Other | Admitting: Physician Assistant

## 2021-03-31 ENCOUNTER — Encounter: Payer: Self-pay | Admitting: Physician Assistant

## 2021-03-31 ENCOUNTER — Encounter (HOSPITAL_COMMUNITY): Payer: Self-pay | Admitting: Emergency Medicine

## 2021-03-31 ENCOUNTER — Inpatient Hospital Stay (HOSPITAL_COMMUNITY)
Admission: EM | Admit: 2021-03-31 | Discharge: 2021-04-03 | DRG: 101 | Disposition: A | Discharge status: Skilled Nursing Facility | Payer: Medicare Other | Attending: Internal Medicine | Admitting: Internal Medicine

## 2021-03-31 ENCOUNTER — Ambulatory Visit: Payer: Commercial Managed Care - HMO | Admitting: Urology

## 2021-03-31 ENCOUNTER — Emergency Department (HOSPITAL_COMMUNITY): Payer: Medicare Other

## 2021-03-31 ENCOUNTER — Other Ambulatory Visit: Payer: Self-pay

## 2021-03-31 ENCOUNTER — Inpatient Hospital Stay: Payer: Medicare Other | Admitting: Family Medicine

## 2021-03-31 VITALS — BP 146/90 | HR 83 | Ht 63.0 in | Wt 156.1 lb

## 2021-03-31 DIAGNOSIS — Z823 Family history of stroke: Secondary | ICD-10-CM

## 2021-03-31 DIAGNOSIS — E1122 Type 2 diabetes mellitus with diabetic chronic kidney disease: Secondary | ICD-10-CM | POA: Diagnosis present

## 2021-03-31 DIAGNOSIS — Z803 Family history of malignant neoplasm of breast: Secondary | ICD-10-CM

## 2021-03-31 DIAGNOSIS — E1165 Type 2 diabetes mellitus with hyperglycemia: Secondary | ICD-10-CM | POA: Diagnosis present

## 2021-03-31 DIAGNOSIS — Z833 Family history of diabetes mellitus: Secondary | ICD-10-CM

## 2021-03-31 DIAGNOSIS — I252 Old myocardial infarction: Secondary | ICD-10-CM

## 2021-03-31 DIAGNOSIS — Z79890 Hormone replacement therapy (postmenopausal): Secondary | ICD-10-CM

## 2021-03-31 DIAGNOSIS — Z955 Presence of coronary angioplasty implant and graft: Secondary | ICD-10-CM

## 2021-03-31 DIAGNOSIS — I69351 Hemiplegia and hemiparesis following cerebral infarction affecting right dominant side: Secondary | ICD-10-CM | POA: Diagnosis not present

## 2021-03-31 DIAGNOSIS — Z20822 Contact with and (suspected) exposure to covid-19: Secondary | ICD-10-CM | POA: Diagnosis present

## 2021-03-31 DIAGNOSIS — Z800 Family history of malignant neoplasm of digestive organs: Secondary | ICD-10-CM

## 2021-03-31 DIAGNOSIS — D6489 Other specified anemias: Secondary | ICD-10-CM | POA: Diagnosis present

## 2021-03-31 DIAGNOSIS — G40909 Epilepsy, unspecified, not intractable, without status epilepticus: Secondary | ICD-10-CM

## 2021-03-31 DIAGNOSIS — F419 Anxiety disorder, unspecified: Secondary | ICD-10-CM

## 2021-03-31 DIAGNOSIS — I10 Essential (primary) hypertension: Secondary | ICD-10-CM | POA: Diagnosis not present

## 2021-03-31 DIAGNOSIS — R569 Unspecified convulsions: Secondary | ICD-10-CM | POA: Diagnosis present

## 2021-03-31 DIAGNOSIS — G40A01 Absence epileptic syndrome, not intractable, with status epilepticus: Secondary | ICD-10-CM | POA: Diagnosis present

## 2021-03-31 DIAGNOSIS — N1832 Chronic kidney disease, stage 3b: Secondary | ICD-10-CM | POA: Diagnosis present

## 2021-03-31 DIAGNOSIS — Z8249 Family history of ischemic heart disease and other diseases of the circulatory system: Secondary | ICD-10-CM

## 2021-03-31 DIAGNOSIS — Z7982 Long term (current) use of aspirin: Secondary | ICD-10-CM

## 2021-03-31 DIAGNOSIS — Z808 Family history of malignant neoplasm of other organs or systems: Secondary | ICD-10-CM

## 2021-03-31 DIAGNOSIS — Z8051 Family history of malignant neoplasm of kidney: Secondary | ICD-10-CM | POA: Diagnosis not present

## 2021-03-31 DIAGNOSIS — I161 Hypertensive emergency: Secondary | ICD-10-CM | POA: Diagnosis present

## 2021-03-31 DIAGNOSIS — I4891 Unspecified atrial fibrillation: Secondary | ICD-10-CM | POA: Diagnosis present

## 2021-03-31 DIAGNOSIS — I2510 Atherosclerotic heart disease of native coronary artery without angina pectoris: Secondary | ICD-10-CM | POA: Diagnosis present

## 2021-03-31 DIAGNOSIS — E785 Hyperlipidemia, unspecified: Secondary | ICD-10-CM | POA: Diagnosis present

## 2021-03-31 DIAGNOSIS — Z7901 Long term (current) use of anticoagulants: Secondary | ICD-10-CM | POA: Diagnosis not present

## 2021-03-31 DIAGNOSIS — E039 Hypothyroidism, unspecified: Secondary | ICD-10-CM | POA: Diagnosis present

## 2021-03-31 DIAGNOSIS — Z8041 Family history of malignant neoplasm of ovary: Secondary | ICD-10-CM

## 2021-03-31 DIAGNOSIS — Z9884 Bariatric surgery status: Secondary | ICD-10-CM

## 2021-03-31 DIAGNOSIS — Z85820 Personal history of malignant melanoma of skin: Secondary | ICD-10-CM

## 2021-03-31 DIAGNOSIS — Z79899 Other long term (current) drug therapy: Secondary | ICD-10-CM

## 2021-03-31 DIAGNOSIS — Z87891 Personal history of nicotine dependence: Secondary | ICD-10-CM

## 2021-03-31 DIAGNOSIS — Z90721 Acquired absence of ovaries, unilateral: Secondary | ICD-10-CM

## 2021-03-31 DIAGNOSIS — I129 Hypertensive chronic kidney disease with stage 1 through stage 4 chronic kidney disease, or unspecified chronic kidney disease: Secondary | ICD-10-CM | POA: Diagnosis present

## 2021-03-31 DIAGNOSIS — Z7902 Long term (current) use of antithrombotics/antiplatelets: Secondary | ICD-10-CM

## 2021-03-31 LAB — CBC WITH DIFFERENTIAL/PLATELET
Abs Immature Granulocytes: 0.05 10*3/uL (ref 0.00–0.07)
Basophils Absolute: 0.1 10*3/uL (ref 0.0–0.1)
Basophils Relative: 1 %
Eosinophils Absolute: 0.2 10*3/uL (ref 0.0–0.5)
Eosinophils Relative: 2 %
HCT: 31.5 % — ABNORMAL LOW (ref 36.0–46.0)
Hemoglobin: 9.8 g/dL — ABNORMAL LOW (ref 12.0–15.0)
Immature Granulocytes: 1 %
Lymphocytes Relative: 13 %
Lymphs Abs: 1.3 10*3/uL (ref 0.7–4.0)
MCH: 27.9 pg (ref 26.0–34.0)
MCHC: 31.1 g/dL (ref 30.0–36.0)
MCV: 89.7 fL (ref 80.0–100.0)
Monocytes Absolute: 0.4 10*3/uL (ref 0.1–1.0)
Monocytes Relative: 4 %
Neutro Abs: 7.6 10*3/uL (ref 1.7–7.7)
Neutrophils Relative %: 79 %
Platelets: 167 10*3/uL (ref 150–400)
RBC: 3.51 MIL/uL — ABNORMAL LOW (ref 3.87–5.11)
RDW: 15.8 % — ABNORMAL HIGH (ref 11.5–15.5)
WBC: 9.6 10*3/uL (ref 4.0–10.5)
nRBC: 0 % (ref 0.0–0.2)

## 2021-03-31 LAB — COMPREHENSIVE METABOLIC PANEL
ALT: 38 U/L (ref 0–44)
AST: 34 U/L (ref 15–41)
Albumin: 3.1 g/dL — ABNORMAL LOW (ref 3.5–5.0)
Alkaline Phosphatase: 109 U/L (ref 38–126)
Anion gap: 8 (ref 5–15)
BUN: 19 mg/dL (ref 6–20)
CO2: 26 mmol/L (ref 22–32)
Calcium: 8.5 mg/dL — ABNORMAL LOW (ref 8.9–10.3)
Chloride: 111 mmol/L (ref 98–111)
Creatinine, Ser: 1.62 mg/dL — ABNORMAL HIGH (ref 0.44–1.00)
GFR, Estimated: 38 mL/min — ABNORMAL LOW (ref >60)
Glucose, Bld: 131 mg/dL — ABNORMAL HIGH (ref 70–99)
Potassium: 4.7 mmol/L (ref 3.5–5.1)
Sodium: 145 mmol/L (ref 135–145)
Total Bilirubin: 0.3 mg/dL (ref 0.3–1.2)
Total Protein: 6.2 g/dL — ABNORMAL LOW (ref 6.5–8.1)

## 2021-03-31 LAB — I-STAT BETA HCG BLOOD, ED (MC, WL, AP ONLY): I-stat hCG, quantitative: <5 m[IU]/mL (ref <5)

## 2021-03-31 LAB — ETHANOL: Alcohol, Ethyl (B): <10 mg/dL (ref <10)

## 2021-03-31 LAB — PHOSPHORUS: Phosphorus: 3.7 mg/dL (ref 2.5–4.6)

## 2021-03-31 LAB — RESP PANEL BY RT-PCR (FLU A&B, COVID) ARPGX2
Influenza A by PCR: NEGATIVE
Influenza B by PCR: NEGATIVE
SARS Coronavirus 2 by RT PCR: NEGATIVE

## 2021-03-31 LAB — AMMONIA: Ammonia: <10 umol/L (ref 9–35)

## 2021-03-31 LAB — MAGNESIUM: Magnesium: 1.8 mg/dL (ref 1.7–2.4)

## 2021-03-31 LAB — CBG MONITORING, ED: Glucose-Capillary: 196 mg/dL — ABNORMAL HIGH (ref 70–99)

## 2021-03-31 IMAGING — DX DG CHEST 1V PORT
1 series · 1 of 1 positions shown · non-contrast
Comparison: Chest radiographs 01/27/2019 and earlier

CLINICAL DATA: Chest pain.

EXAM:
PORTABLE CHEST 1 VIEW

[chest ap]
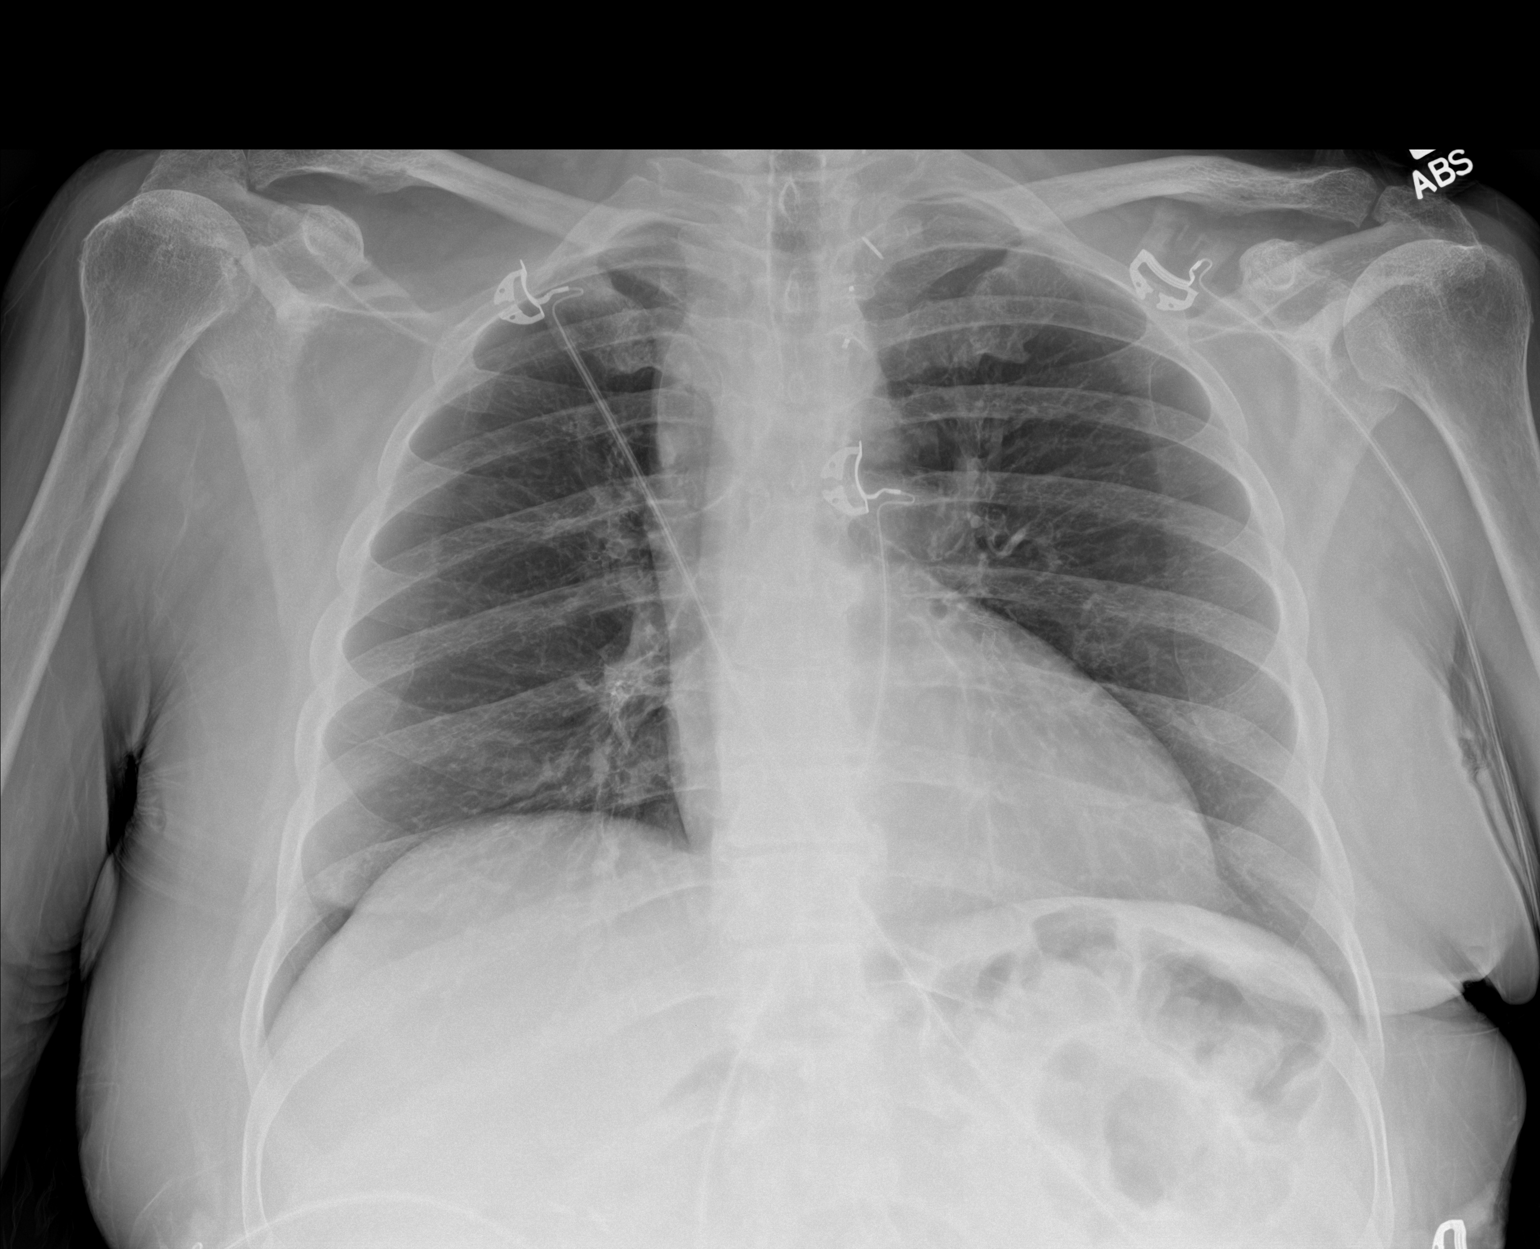

[1 of 1 positions shown; findings below may reference images not displayed]

FINDINGS: Unchanged mild cardiomegaly. Redemonstrated right-sided aortic arch.
No evidence of airspace consolidation within the lungs. No evidence
of pleural effusion or pneumothorax. No acute bony abnormality.
IMPRESSION: No evidence of acute cardiopulmonary abnormality.

Unchanged mild cardiomegaly. Redemonstrated right-sided aortic arch.

## 2021-03-31 MED ORDER — CLOPIDOGREL BISULFATE 75 MG PO TABS
75.0000 mg | ORAL_TABLET | Freq: Every day | ORAL | Status: DC
  Administered 2021-03-31 – 2021-04-03 (×4): 75 mg via ORAL
  Filled 2021-03-31 (×5): qty 1

## 2021-03-31 MED ORDER — CARVEDILOL 12.5 MG PO TABS
12.5000 mg | ORAL_TABLET | Freq: Two times a day (BID) | ORAL | Status: DC
  Administered 2021-03-31 – 2021-04-01 (×3): 12.5 mg via ORAL
  Filled 2021-03-31: qty 1
  Filled 2021-03-31: qty 4
  Filled 2021-03-31: qty 1

## 2021-03-31 MED ORDER — INSULIN ASPART 100 UNIT/ML IJ SOLN
0.0000 [IU] | Freq: Every day | INTRAMUSCULAR | Status: DC
  Administered 2021-04-02: 2 [IU] via SUBCUTANEOUS

## 2021-03-31 MED ORDER — ACETAMINOPHEN-CODEINE #3 300-30 MG PO TABS
1.0000 | ORAL_TABLET | Freq: Four times a day (QID) | ORAL | Status: DC | PRN
  Administered 2021-04-01 – 2021-04-03 (×4): 1 via ORAL
  Filled 2021-03-31 (×5): qty 1

## 2021-03-31 MED ORDER — ROSUVASTATIN CALCIUM 20 MG PO TABS
40.0000 mg | ORAL_TABLET | Freq: Every day | ORAL | Status: DC
  Administered 2021-04-01 – 2021-04-02 (×2): 40 mg via ORAL
  Filled 2021-03-31 (×2): qty 2

## 2021-03-31 MED ORDER — BACLOFEN 5 MG HALF TABLET
5.0000 mg | ORAL_TABLET | Freq: Two times a day (BID) | ORAL | Status: DC | PRN
  Filled 2021-03-31: qty 1

## 2021-03-31 MED ORDER — ACETAMINOPHEN 325 MG PO TABS: 650.0000 mg | ORAL_TABLET | Freq: Four times a day (QID) | ORAL | Status: DC | PRN

## 2021-03-31 MED ORDER — ACETAMINOPHEN 650 MG RE SUPP: 650.0000 mg | Freq: Four times a day (QID) | RECTAL | Status: DC | PRN

## 2021-03-31 MED ORDER — DESVENLAFAXINE SUCCINATE ER 50 MG PO TB24
50.0000 mg | ORAL_TABLET | Freq: Every day | ORAL | Status: DC
  Administered 2021-04-01 – 2021-04-03 (×3): 50 mg via ORAL
  Filled 2021-03-31 (×3): qty 1

## 2021-03-31 MED ORDER — GABAPENTIN 300 MG PO CAPS
600.0000 mg | ORAL_CAPSULE | Freq: Two times a day (BID) | ORAL | Status: DC
  Administered 2021-03-31 – 2021-04-03 (×6): 600 mg via ORAL
  Filled 2021-03-31 (×6): qty 2

## 2021-03-31 MED ORDER — ASPIRIN EC 81 MG PO TBEC
81.0000 mg | DELAYED_RELEASE_TABLET | Freq: Every day | ORAL | Status: DC
  Administered 2021-04-01 – 2021-04-03 (×3): 81 mg via ORAL
  Filled 2021-03-31 (×4): qty 1

## 2021-03-31 MED ORDER — ISOSORBIDE MONONITRATE ER 60 MG PO TB24
120.0000 mg | ORAL_TABLET | Freq: Every day | ORAL | Status: DC
Start: 2021-03-31 — End: 2021-04-03
  Administered 2021-03-31 – 2021-04-03 (×4): 120 mg via ORAL
  Filled 2021-03-31: qty 2
  Filled 2021-03-31 (×2): qty 4
  Filled 2021-03-31: qty 2

## 2021-03-31 MED ORDER — ENOXAPARIN SODIUM 40 MG/0.4ML IJ SOSY
40.0000 mg | PREFILLED_SYRINGE | INTRAMUSCULAR | Status: DC
  Administered 2021-03-31 – 2021-04-02 (×3): 40 mg via SUBCUTANEOUS
  Filled 2021-03-31 (×3): qty 0.4

## 2021-03-31 MED ORDER — MORPHINE SULFATE (PF) 2 MG/ML IV SOLN: 2.0000 mg | INTRAVENOUS | Status: DC | PRN

## 2021-03-31 MED ORDER — MIRABEGRON ER 25 MG PO TB24
50.0000 mg | ORAL_TABLET | Freq: Every day | ORAL | Status: DC
  Administered 2021-04-01 – 2021-04-03 (×3): 50 mg via ORAL
  Filled 2021-03-31: qty 1
  Filled 2021-03-31 (×2): qty 2

## 2021-03-31 MED ORDER — ALBUTEROL SULFATE (2.5 MG/3ML) 0.083% IN NEBU
2.5000 mg | INHALATION_SOLUTION | Freq: Four times a day (QID) | RESPIRATORY_TRACT | Status: DC
  Filled 2021-03-31 (×2): qty 3

## 2021-03-31 MED ORDER — HYDRALAZINE HCL 20 MG/ML IJ SOLN
10.0000 mg | Freq: Four times a day (QID) | INTRAMUSCULAR | Status: DC | PRN
  Filled 2021-03-31: qty 1

## 2021-03-31 MED ORDER — ALPRAZOLAM 0.5 MG PO TABS: 1.0000 mg | ORAL_TABLET | Freq: Two times a day (BID) | ORAL | Status: DC | PRN

## 2021-03-31 MED ORDER — LEVETIRACETAM 500 MG PO TABS
1000.0000 mg | ORAL_TABLET | Freq: Two times a day (BID) | ORAL | Status: DC
Start: 2021-03-31 — End: 2021-04-03
  Administered 2021-03-31 – 2021-04-03 (×6): 1000 mg via ORAL
  Filled 2021-03-31 (×6): qty 2

## 2021-03-31 MED ORDER — RIMEGEPANT SULFATE 75 MG PO TBDP: 75.0000 mg | ORAL_TABLET | ORAL | Status: DC | PRN

## 2021-03-31 MED ORDER — LACOSAMIDE 200 MG PO TABS
200.0000 mg | ORAL_TABLET | Freq: Two times a day (BID) | ORAL | Status: DC
  Administered 2021-03-31 – 2021-04-03 (×6): 200 mg via ORAL
  Filled 2021-03-31: qty 4
  Filled 2021-03-31 (×3): qty 1
  Filled 2021-03-31: qty 4
  Filled 2021-03-31: qty 1

## 2021-03-31 MED ORDER — OXYCODONE HCL 5 MG PO TABS: 5.0000 mg | ORAL_TABLET | ORAL | Status: DC | PRN

## 2021-03-31 MED ORDER — MIRTAZAPINE 15 MG PO TABS
7.5000 mg | ORAL_TABLET | Freq: Every day | ORAL | Status: DC
  Administered 2021-03-31 – 2021-04-02 (×3): 7.5 mg via ORAL
  Filled 2021-03-31 (×4): qty 1

## 2021-03-31 MED ORDER — LEVOTHYROXINE SODIUM 25 MCG PO TABS
25.0000 ug | ORAL_TABLET | Freq: Every day | ORAL | Status: DC
  Administered 2021-04-01 – 2021-04-03 (×3): 25 ug via ORAL
  Filled 2021-03-31 (×3): qty 1

## 2021-03-31 MED ORDER — SENNA 8.6 MG PO TABS
1.0000 | ORAL_TABLET | Freq: Two times a day (BID) | ORAL | Status: DC
  Administered 2021-04-01 – 2021-04-03 (×3): 8.6 mg via ORAL
  Filled 2021-03-31 (×5): qty 1

## 2021-03-31 MED ORDER — INSULIN ASPART 100 UNIT/ML IJ SOLN
0.0000 [IU] | Freq: Three times a day (TID) | INTRAMUSCULAR | Status: DC
  Administered 2021-04-01 – 2021-04-02 (×2): 3 [IU] via SUBCUTANEOUS

## 2021-03-31 MED ORDER — FUROSEMIDE 20 MG PO TABS: 20.0000 mg | ORAL_TABLET | Freq: Every day | ORAL | Status: DC | PRN

## 2021-03-31 NOTE — H&P (Signed)
[facility] History and Physical  [name] QQI:[id number] DOB: 1970-08-20 DOA: 03/31/2021 PCP: [name], MD  Admitted from: Home Chief Complaint: Absence seizures  History of Present Illness: [name] is a 51 y.o. female with PMH significant for DM2, HTN, HLD, A-fib, CAD/multiple stents, CVA with residual right-sided weakness, gastric ulcer, history of GI bleed, iron deficiency anemia, hypothyroidism, neuromuscular disorder, recently admitted for seizure. Patient was brought to the ED today with complaint of several instances of absence seizure's at home.  In the ED, patient was afebrile, heart rate in 60s, blood pressure 172/99, breathing on room air Labs with creatinine elevated to 1.62, hemoglobin 9.8 CT scan of head did not show any acute intracranial abnormality.  Of note, patient was recently hospitalized from 2/4 to 2/14 for status epilepticus with absent seizures, metabolic encephalopathy, hypertensive emergency, UTI.  Insurance denied CIR, she was recommended for discharge to SNF but she declined and went home with PT, OT and aide.  She was discharged on Vimpat 200 mg twice daily, Keppra 1000 mg twice daily.    At the time of my evaluation, patient was lying down in ED bed.  Not in distress.  Alert, awake, oriented x3.  She states he has mild cognitive and memory issues after previous stroke.  She is currently living with her mom who is essentially taking care of her.  Her son is a Agricultural consultant and unable to be available for her.  She hopes that she gets approved for CIR at this time.  Review of Systems:  All systems were reviewed and were negative unless otherwise mentioned in the HPI   Past medical history: Past Medical History:  Diagnosis Date   Acute colitis 01/27/2017   Acute pyelonephritis    Acute upper GI bleed 01/25/2020   Anemia    iron deficiency anemia   Aortic arch aneurysm    BRCA negative 2014   CAD (coronary artery disease)     a. 08/2003 Cath: LAD 30-40-med Rx; b. 11/2014 PCI: LAD 69m(3.25x23 Xience Alpine DES); c. 06/2015 PCI: D1 ((9.41D40Resolute Integrity DES); d. 06/2017 PCI: Patent mLAD stent, D2 95 (PTCA); e. 09/2017 PCI: D2 99ost (CBA); d. 12/2017 Cath: LM nl, LAD 316m80d (small), D1 40ost, D2 95ost, LCX 40p, RCA 40ost/p->Med rx for D2 given restenosis.   Closed nondisplaced intertrochanteric fracture of left femur ([facility]9/10/2018   Colitis 06/03/2015   Colon polyp    CVA (cerebral vascular accident) ([facility]   Left side weakness.    Degenerative tear of glenoid labrum of right shoulder 03/15/2017   Diabetes mellitus without complication ([facility]   Family history of breast cancer    BRCA neg 2014   Femur fracture, left ([facility]/10/2018   Gastric ulcer 04/09/12/4818 Helicobacter pylori infection 11/23/2014   History of echocardiogram    a. 03/2017 Echo: EF 60-65%, no rwma; b. 02/2018 Echo: EF 60-65%, no rwma. Nl RV fxn. No cardiac source of emboli (admitted w/ stroke).   Hypertension    Iron deficiency anemia secondary to blood loss (chronic)    Malignant melanoma of skin of scalp (HCC)    MI, acute, non ST segment elevation (HCC)    Neuromuscular disorder (HCC)    S/P drug eluting coronary stent placement 06/04/2015   Sepsis ([facility]/14/2019   Sepsis secondary to UTI ([facility] [place]3/22/2022   Stroke ([facility]   a. 02/2018 MRI: 75m64mate acute/early subacute L medial frontal lobe inarct; b.  02/2018 MRA No large vessel occlusion or aneurysm. Mod to sev L P2 stenosis. thready L vertebral artery, diffusely dzs'd; c. 02/2018 Carotid U/S: <50% bilat ICA dzs.   Unspecified atrial fibrillation ([place]) 04/23/2020    Past surgical history: Past Surgical History:  Procedure Laterality Date   APPENDECTOMY     BALLOON ENTEROSCOPY  02/06/2020   [facility]   BIOPSY N/A 03/14/2020   Procedure: BIOPSY;  Surgeon: [name], MD;  Location: [place];  Service: Endoscopy;  Laterality: N/A;   CARDIAC CATHETERIZATION N/A 11/09/2014    Procedure: Coronary Angiography;  Surgeon: Minna Merritts, MD;  Location: [place] [facility];  Service: Cardiovascular;  Laterality: N/A;   CARDIAC CATHETERIZATION N/A 11/12/2014   Procedure: Coronary Stent Intervention;  Surgeon: Isaias Cowman, MD;  Location: [place] [facility];  Service: Cardiovascular;  Laterality: N/A;   CARDIAC CATHETERIZATION N/A 04/18/2015   Procedure: Left Heart Cath and Coronary Angiography;  Surgeon: Minna Merritts, MD;  Location: [place] [facility];  Service: Cardiovascular;  Laterality: N/A;   CARDIAC CATHETERIZATION Left 06/04/2015   Procedure: Left Heart Cath and Coronary Angiography;  Surgeon: Wellington Hampshire, MD;  Location: [place] [facility];  Service: Cardiovascular;  Laterality: Left;   CARDIAC CATHETERIZATION N/A 06/04/2015   Procedure: Coronary Stent Intervention;  Surgeon: Wellington Hampshire, MD;  Location: [place] [facility];  Service: Cardiovascular;  Laterality: N/A;   CESAREAN SECTION  2001   CHOLECYSTECTOMY N/A 11/18/2016   Procedure: LAPAROSCOPIC CHOLECYSTECTOMY WITH INTRAOPERATIVE CHOLANGIOGRAM;  Surgeon: Christene Lye, MD;  Location: [facility];  Service: General;  Laterality: N/A;   COLONOSCOPY WITH PROPOFOL N/A 04/27/2016   Procedure: COLONOSCOPY WITH PROPOFOL;  Surgeon: [name], MD;  Location: [place];  Service: Endoscopy;  Laterality: N/A;   COLONOSCOPY WITH PROPOFOL N/A 01/12/2018   Procedure: COLONOSCOPY WITH PROPOFOL;  Surgeon: Toledo, Benay Pike, MD;  Location: [facility];  Service: Endoscopy;  Laterality: N/A;   COLONOSCOPY WITH PROPOFOL N/A 03/14/2020   Procedure: COLONOSCOPY WITH PROPOFOL;  Surgeon: [name], MD;  Location: [place];  Service: Endoscopy;  Laterality: N/A;  Needs to be scheduled after 7:30 due to driver issues   CORONARY ANGIOPLASTY     CORONARY BALLOON ANGIOPLASTY N/A 06/29/2017   Procedure: CORONARY BALLOON ANGIOPLASTY;  Surgeon: Wellington Hampshire, MD;  Location: [place] [facility];  Service: Cardiovascular;  Laterality: N/A;   CORONARY BALLOON ANGIOPLASTY N/A 09/20/2017   Procedure: CORONARY BALLOON ANGIOPLASTY;  Surgeon: Wellington Hampshire, MD;  Location: [place] [facility];  Service: Cardiovascular;  Laterality: N/A;   CORONARY STENT INTERVENTION N/A 12/13/2019   Procedure: CORONARY STENT INTERVENTION;  Surgeon: Wellington Hampshire, MD;  Location: [place] [facility];  Service: Cardiovascular;  Laterality: N/A;   DILATION AND CURETTAGE OF UTERUS     ESOPHAGOGASTRODUODENOSCOPY (EGD) WITH PROPOFOL N/A 09/14/2014   Procedure: ESOPHAGOGASTRODUODENOSCOPY (EGD) WITH PROPOFOL;  Surgeon: Josefine Class, MD;  Location: [facility];  Service: Endoscopy;  Laterality: N/A;   ESOPHAGOGASTRODUODENOSCOPY (EGD) WITH PROPOFOL N/A 04/27/2016   Procedure: ESOPHAGOGASTRODUODENOSCOPY (EGD) WITH PROPOFOL;  Surgeon: [name], MD;  Location: [place];  Service: Endoscopy;  Laterality: N/A;  Diabetic - oral meds   ESOPHAGOGASTRODUODENOSCOPY (EGD) WITH PROPOFOL N/A 01/12/2018   Procedure: ESOPHAGOGASTRODUODENOSCOPY (EGD) WITH PROPOFOL;  Surgeon: Toledo, Benay Pike, MD;  Location: [facility];  Service: Endoscopy;  Laterality: N/A;   ESOPHAGOGASTRODUODENOSCOPY (EGD) WITH PROPOFOL N/A 04/11/2019   Procedure: ESOPHAGOGASTRODUODENOSCOPY (EGD) WITH PROPOFOL;  Surgeon: Allen Norris,  Darren, MD;  Location: [place] [facility];  Service: Endoscopy;  Laterality: N/A;   ESOPHAGOGASTRODUODENOSCOPY (EGD) WITH PROPOFOL N/A 03/08/2020   Procedure: ESOPHAGOGASTRODUODENOSCOPY (EGD) WITH PROPOFOL;  Surgeon: [name], MD;  Location[facility];  Service: Endoscopy;  Laterality: N/A;   GASTRIC BYPASS  09/2009   [facility]    INTRAMEDULLARY (IM) NAIL INTERTROCHANTERIC Left 10/13/2018   Procedure: INTRAMEDULLARY (IM) NAIL INTERTROCHANTRIC;  Surgeon: Leandrew Koyanagi, MD;  Location: [place];  Service: Orthopedics;  Laterality: Left;   Left Carotid to sublcavian artery bypass w/  subclavian artery ligation     a. Performed @ [facility].   LEFT HEART CATH AND CORONARY ANGIOGRAPHY Left 06/29/2017   Procedure: LEFT HEART CATH AND CORONARY ANGIOGRAPHY;  Surgeon: Wellington Hampshire, MD;  Location: [place] [facility];  Service: Cardiovascular;  Laterality: Left;   LEFT HEART CATH AND CORONARY ANGIOGRAPHY N/A 09/20/2017   Procedure: LEFT HEART CATH AND CORONARY ANGIOGRAPHY;  Surgeon: Wellington Hampshire, MD;  Location: [place] [facility];  Service: Cardiovascular;  Laterality: N/A;   LEFT HEART CATH AND CORONARY ANGIOGRAPHY N/A 12/20/2017   Procedure: LEFT HEART CATH AND CORONARY ANGIOGRAPHY;  Surgeon: Wellington Hampshire, MD;  Location: [place] [facility];  Service: Cardiovascular;  Laterality: N/A;   LEFT HEART CATH AND CORONARY ANGIOGRAPHY N/A 04/20/2019   Procedure: LEFT HEART CATH AND CORONARY ANGIOGRAPHY possible PCI;  Surgeon: Minna Merritts, MD;  Location: [place] [facility];  Service: Cardiovascular;  Laterality: N/A;   LEFT HEART CATH AND CORONARY ANGIOGRAPHY N/A 12/13/2019   Procedure: LEFT HEART CATH AND CORONARY ANGIOGRAPHY;  Surgeon: Wellington Hampshire, MD;  Location: [place] [facility];  Service: Cardiovascular;  Laterality: N/A;   MELANOMA EXCISION  2016   Dr. Henreitta Cea ABLATION  2002   RIGHT OOPHORECTOMY     SHOULDER ARTHROSCOPY WITH OPEN ROTATOR CUFF REPAIR Right 01/07/2016   Procedure: SHOULDER ARTHROSCOPY WITH DEBRIDMENT, SUBACHROMIAL DECOMPRESSION;  Surgeon: Corky Mull, MD;  Location: [facility];  Service: Orthopedics;  Laterality: Right;   SHOULDER ARTHROSCOPY WITH OPEN ROTATOR CUFF REPAIR Right 03/16/2017   Procedure: SHOULDER ARTHROSCOPY WITH OPEN ROTATOR CUFF REPAIR POSSIBLE BICEPS TENODESIS;  Surgeon: Corky Mull, MD;  Location: [facility];  Service: Orthopedics;  Laterality: Right;   TRIGGER FINGER RELEASE Right     [place]    Social History:  reports that she quit smoking about 26 years ago. Her smoking use included cigarettes. She has  a 0.25 pack-year smoking history. She has never used smokeless tobacco. She reports that she does not drink alcohol and does not use drugs.  Allergies:  Allergies  Allergen Reactions   Lipitor [Atorvastatin] Other (See Comments)    Leg pains    Family history:  Family History  Problem Relation Age of Onset   Hypertension Mother    Anxiety disorder Mother    Depression Mother    Bipolar disorder Mother    Heart disease Mother        No details   Hyperlipidemia Mother    Kidney disease Father    Heart disease Father 74   Hypertension Father    Diabetes Father    Stroke Father    Colon cancer Father        dx in his 37's   Anxiety disorder Father    Depression Father    Skin cancer Father    Kidney disease Sister    Thyroid nodules Sister    Hypertension  Sister    Hypertension Sister    Diabetes Sister    Hyperlipidemia Sister    Depression Sister    Breast cancer Maternal Aunt 30   Breast cancer Maternal Aunt 12   Ovarian cancer Cousin    Colon cancer Cousin    Kidney cancer Cousin    Breast cancer Other    Bladder Cancer Neg Hx      Home Meds: Prior to Admission medications   Medication Sig Start Date End Date Taking? Authorizing Provider  AIMOVIG 140 MG/ML SOAJ Inject 140 mg into the skin every 28 (twenty-eight) days. 10/14/20   [provider]  ALPRAZolam Duanne Moron) 1 MG tablet Take 1 tablet by mouth twice daily Patient taking differently: Take 1 mg by mouth 2 (two) times daily as needed for anxiety. 11/26/20   [name], MD  aspirin EC 81 MG EC tablet Take 1 tablet (81 mg total) by mouth daily. Swallow whole. 12/10/20 02/09/22  Elodia Florence., MD  Baclofen 5 MG TABS Take 5 mg by mouth 2 (two) times daily as needed (muscle spasms). 11/26/20   [provider]  carvedilol (COREG) 12.5 MG tablet Take 1 tablet by mouth twice daily Patient taking differently: 12.5 mg 2 (two) times daily with a meal. 08/21/20   Gollan, Kathlene November, MD   clopidogrel (PLAVIX) 75 MG tablet Take 1 tablet (75 mg total) by mouth daily. 11/20/20   Furth, Cadence H, PA-C  desvenlafaxine (PRISTIQ) 50 MG 24 hr tablet Take 50 mg by mouth daily. 11/26/20   [provider]  diclofenac Sodium (VOLTAREN) 1 % GEL Apply 2 g topically 4 (four) times daily as needed (joint paim).    [provider]  diphenoxylate-atropine (LOMOTIL) 2.5-0.025 MG tablet Take 1 tablet by mouth 4 (four) times daily as needed for diarrhea or loose stools. 01/07/21   [name], MD  estradiol (ESTRACE) 0.1 MG/GM vaginal cream Place 1 Applicatorful vaginally 3 (three) times a week. Estrogen Cream Instruction Discard applicator Apply pea sized amount to tip of finger to urethra before bed. Wash hands well after application. Use Monday, Wednesday and Friday 03/11/21 03/11/22  Billey Co, MD  furosemide (LASIX) 20 MG tablet Take 1 tablet by mouth every day as needed for swelling Patient taking differently: Take 20 mg by mouth daily as needed (swelling). 12/13/20   Furth, Cadence H, PA-C  gabapentin (NEURONTIN) 300 MG capsule Take 2 capsules (600 mg total) by mouth 2 (two) times daily. 07/24/20   [name], MD  glucose 4 GM chewable tablet Chew 1 tablet (4 g total) by mouth 3 (three) times daily as needed for low blood sugar. Home med. 02/08/21   Enzo Bi, MD  isosorbide mononitrate (IMDUR) 120 MG 24 hr tablet Take 1 tablet by mouth every day Patient taking differently: Take 120 mg by mouth daily. 01/28/21   Minna Merritts, MD  lacosamide (VIMPAT) 200 MG TABS tablet Take 1 tablet (200 mg total) by mouth 2 (two) times daily. 03/20/21   Patrecia Pour, MD  levETIRAcetam (KEPPRA) 1000 MG tablet Take 1 tablet (1,000 mg total) by mouth 2 (two) times daily. 03/20/21   Patrecia Pour, MD  levothyroxine (SYNTHROID) 25 MCG tablet Take 1 tablet by mouth 30 minutes before breakfast Patient taking differently: Take 25 mcg by mouth daily before breakfast. 11/26/20   [name],  MD  mirabegron ER (MYRBETRIQ) 50 MG TB24 tablet Take 1 tablet (50 mg total) by mouth daily.  02/17/21   Bjorn Loser, MD  mirtazapine (REMERON) 7.5 MG tablet Take 1 tablet (7.5 mg total) by mouth at bedtime. 03/20/21   Patrecia Pour, MD  nitrofurantoin (MACRODANTIN) 100 MG capsule Hold while on IV antibiotic. Patient taking differently: Take 100 mg by mouth 2 (two) times daily. 02/08/21   Enzo Bi, MD  nitroGLYCERIN (NITROSTAT) 0.4 MG SL tablet Dissolve 1 tablet under tongue every 52mns, up to 3 doses, for chest pain. Max 3 doses in 322ms. Patient taking differently: Place 0.4 mg under the tongue every 5 (five) minutes x 3 doses as needed for chest pain. 12/30/20   GoMinna MerrittsMD  pantoprazole (PROTONIX) 40 MG tablet Take 1 tablet (40 mg total) by mouth 2 (two) times daily. 12/20/20 02/09/22  FiBirdie SonsMD  Polyethyl Glycol-Propyl Glycol (SYSTANE OP) Place 1 drop into both eyes daily as needed (dry eyes).    [provider]  promethazine (PHENERGAN) 25 MG tablet Take 1 tablet (25 mg total) by mouth every 6 (six) hours as needed for nausea or vomiting. 07/02/20   FiBirdie SonsMD  Rimegepant Sulfate (NURTEC) 75 MG TBDP Take 75 mg by mouth as needed. Patient taking differently: Take 75 mg by mouth as needed (migraine). 01/17/21   YaMarcial PacasMD  rosuvastatin (CRESTOR) 40 MG tablet Take 1 tablet (40 mg total) by mouth daily at 6 PM. 10/25/19   GoMinna MerrittsMD    Physical Exam: Vitals:   03/31/21 1312 03/31/21 1632  BP: (!) 172/99 (!) 163/82  Pulse: 88 69  Resp: 18 16  Temp: 98.4 F (36.9 C)   TempSrc: Oral   SpO2: 98% 99%   Wt Readings from Last 3 Encounters:  03/31/21 70.8 kg  03/16/21 71.9 kg  03/08/21 80 kg   There is no height or weight on file to calculate BMI.  General exam: Pleasant, middle-aged [name].  Not in physical distress Skin: No rashes, lesions or ulcers. HEENT: Atraumatic, normocephalic, no obvious bleeding Lungs: Clear to  auscultation bilaterally CVS: Regular rate and rhythm, no murmur GI/Abd soft, nontender, nondistended, bowel sound present CNS: Alert, awake, oriented x3, slow to respond Psychiatry: Sad affect Extremities: No pedal edema, no calf tenderness     Consult Orders  (From admission, onward)           Start     Ordered   03/31/21 1718  Consult to hospitalist  Paged to [facility] by [place] ShanOnce       Provider:  (Not yet assigned)  Question Answer Comment  Place call to: [facility]   Reason for Consult Admit      03/31/21 1717            Labs on Admission:   CBC: Recent Labs  Lab 03/31/21 1329  WBC 9.6  NEUTROABS 7.6  HGB 9.8*  HCT 31.5*  MCV 89.7  PLT 16100  Basic Metabolic Panel: Recent Labs  Lab 03/31/21 1329  NA 145  K 4.7  CL 111  CO2 26  GLUCOSE 131*  BUN 19  CREATININE 1.62*  CALCIUM 8.5*  MG 1.8  PHOS 3.7    Liver Function Tests: Recent Labs  Lab 03/31/21 1329  AST 34  ALT 38  ALKPHOS 109  BILITOT 0.3  PROT 6.2*  ALBUMIN 3.1*   No results for input(s): LIPASE, AMYLASE in the last 168 hours. Recent Labs  Lab 03/31/21 1642  AMMONIA <10    Cardiac Enzymes: No  results for input(s): CKTOTAL, CKMB, CKMBINDEX, TROPONINI in the last 168 hours.  BNP (last 3 results) No results for input(s): BNP in the last 8760 hours.  ProBNP (last 3 results) No results for input(s): PROBNP in the last 8760 hours.  CBG: No results for input(s): GLUCAP in the last 168 hours.  Lipase     Component Value Date/Time   LIPASE 28 12/05/2020 1844   LIPASE 190 02/10/2012 1400     Urinalysis    Component Value Date/Time   COLORURINE YELLOW 03/08/2021 2045   APPEARANCEUR HAZY (A) 03/08/2021 2045   APPEARANCEUR Hazy (A) 02/17/2021 1315   LABSPEC 1.020 03/08/2021 2045   LABSPEC 1.012 12/29/2013 1210   PHURINE 6.0 03/08/2021 2045   GLUCOSEU NEGATIVE 03/08/2021 2045   GLUCOSEU >=500 12/29/2013 1210   HGBUR NEGATIVE 03/08/2021 2045   HGBUR negative  04/14/2010 1435   BILIRUBINUR NEGATIVE 03/08/2021 2045   BILIRUBINUR Negative 02/17/2021 1315   BILIRUBINUR Negative 12/29/2013 1210   KETONESUR NEGATIVE 03/08/2021 2045   PROTEINUR 30 (A) 03/08/2021 2045   UROBILINOGEN 0.2 04/18/2020 1326   UROBILINOGEN negative 04/14/2010 1435   NITRITE POSITIVE (A) 03/08/2021 2045   LEUKOCYTESUR SMALL (A) 03/08/2021 2045   LEUKOCYTESUR Negative 12/29/2013 1210     Drugs of Abuse     Component Value Date/Time   LABOPIA NONE DETECTED 03/09/2021 0211   COCAINSCRNUR NONE DETECTED 03/09/2021 0211   COCAINSCRNUR NONE DETECTED 02/07/2021 2229   LABBENZ POSITIVE (A) 03/09/2021 0211   AMPHETMU NONE DETECTED 03/09/2021 0211   THCU NONE DETECTED 03/09/2021 0211   LABBARB NONE DETECTED 03/09/2021 0211      Radiological Exams on Admission: CT Head Wo Contrast  Result Date: 03/31/2021 CLINICAL DATA:  Seizures. EXAM: CT HEAD WITHOUT CONTRAST TECHNIQUE: Contiguous axial images were obtained from the base of the skull through the vertex without intravenous contrast. RADIATION DOSE REDUCTION: This exam was performed according to the departmental dose-optimization program which includes automated exposure control, adjustment of the mA and/or kV according to patient size and/or use of iterative reconstruction technique. COMPARISON:  March 08, 2021. FINDINGS: Brain: Mild chronic ischemic white matter disease is noted. No mass effect or midline shift is noted. Ventricular size is within normal limits. There is no evidence of mass lesion, hemorrhage or acute infarction. Vascular: No hyperdense vessel or unexpected calcification. Skull: Normal. Negative for fracture or focal lesion. Sinuses/Orbits: No acute finding. Other: None. IMPRESSION: No acute intracranial abnormality seen. Electronically Signed   By: Marijo Conception M.D.   On: 03/31/2021 17:30      ------------------------------------------------------------------------------------------------------ Assessment/Plan: Principal Problem:   Seizure ([place])  Recurrent absent seizures -Patient was brought to the ED for recurrent episodes of blank stares at home.  She was recently admitted for status epilepticus from absent seizures. She showed clinical and electrographic improvement with Keppra and Vimpat and was discharged on the same. -Patient reports compliance to Vimpat and Keppra at home.  Check levels. -Neurology consulted from ED. -Currently not having seizure.  Not loaded on AEDs.  Type 2 diabetes mellitus -A1c 5.6 on 03/09/2021 -Not on antidiabetic medications at home. -Started on sliding scale insulin with Accu-Cheks No results for input(s): GLUCAP in the last 168 hours.  Essential hypertension -Home meds include carvedilol, Imdur, hydralazine. Amlodipine was added on last admission.  CAD/multiple stents History of stroke with mild right-sided residual deficits -On aspirin, Plavix and statin  CKD 3B  -stable creatinine.  Baseline creatinine between 1.3 and 1.8.  Follows up with Dr. Zollie Scale  at [place]. Recent Labs    03/10/21 0946 03/11/21 0451 03/12/21 0441 03/13/21 0738 03/14/21 0118 03/15/21 0227 03/16/21 0424 03/17/21 0707 03/18/21 0906 03/31/21 1329  BUN 23* 33* 28* 32* 33* 28* 24* 25* 22* 19  CREATININE 1.46* 1.67* 1.48* 1.83* 1.94* 2.02* 1.86* 2.16* 1.78* 1.62*   Hypothyroidism -Synthroid  Chronic anemia -Hemoglobin at baseline more than 10.  Slightly low today.  No active bleeding.  History of demyelinating disorder Impaired mobility -Patient follows up with neurology as an outpatient. -In last admission, PT/OT eval as well was obtained. Patient was recommended to stay for last admission but she made a choice to go home with services.  Patient is concerned about burden on her son.  May need placement.  Goals of care -  Code Status: Full Code    Diet:  Diet Order             Diet heart healthy/carb modified Room service appropriate? Yes; Fluid consistency: Thin  Diet effective now                  DVT prophylaxis:  enoxaparin (LOVENOX) injection 40 mg Start: 03/31/21 1800   Antimicrobials: None Fluid: None Consultants: Neurology Family Communication: None at bedside Dispo: The patient is from: Home              Anticipated d/c is to: Pending clinical course.  Patient hopes to go to CIR             ------------------------------------------------------------------------------------- Severity of Illness: The appropriate patient status for this patient is INPATIENT. Inpatient status is judged to be reasonable and necessary in order to provide the required intensity of service to ensure the patient's safety. The patient's presenting symptoms, physical exam findings, and initial radiographic and laboratory data in the context of their chronic comorbidities is felt to place them at high risk for further clinical deterioration. Furthermore, it is not anticipated that the patient will be medically stable for discharge from the hospital within 2 midnights of admission.   * I certify that at the point of admission it is my clinical judgment that the patient will require inpatient hospital care spanning beyond 2 midnights from the point of admission due to high intensity of service, high risk for further deterioration and high frequency of surveillance required.*  -------------------------------------------------------------------------------------  Cline Cools, MD [facility] 03/31/2021

## 2021-03-31 NOTE — Assessment & Plan Note (Addendum)
Weakness at visit I believe is patient's new baseline, working with home OT and PT. Discussed safety precautions to avoid falls.  Nystagmus (?) unsure if new Discussed w/ family need for them to see a neurologist asap to follow up from hospital visit.  Pt and son professed concerns and fears over another seizure, uneasy about her 'out of it' episode this AM that was similar to how she presented the morning before her last seizure that hospitalized her. I explained the fastest way they could receive care is to go to the emergency room for evaluation-- pt and son prefer to be seen at [facility]. I will try to expedite their neurologist appointment.  Continue medications as changed by neurologist at [facility].

## 2021-03-31 NOTE — ED Provider Triage Note (Signed)
Emergency Medicine Provider Triage Evaluation Note  Bertrand , a 51 y.o. female  was evaluated in triage.  Pt complains of "mini seizures."  Last episode was earlier today.  Here.  Patient states that she stares off in the distance and the son has to arouse her.  Last actual seizure per the patient was early February.  Patient currently on Keppra and Vimpat.  Sent by her primary care doctor as her neurologist appointment is in 3 weeks.  Review of Systems  Positive:  Negative: See above   Physical Exam  BP (!) 172/99    Pulse 88    Temp 98.4 F (36.9 C) (Oral)    Resp 18    SpO2 98%  Gen:   Awake, no distress   Resp:  Normal effort  MSK:   Moves extremities without difficulty  Other:    Medical Decision Making  Medically screening exam initiated at 1:32 PM.  Appropriate orders placed.  [place] was informed that the remainder of the evaluation will be completed by another provider, this initial triage assessment does not replace that evaluation, and the importance of remaining in the ED until their evaluation is complete.     Myna Bright [place], [place] 03/31/21 1333

## 2021-03-31 NOTE — ED Notes (Signed)
Spoke to patient regarding time scheduled for seizure meds

## 2021-03-31 NOTE — Assessment & Plan Note (Signed)
It was recommended she start amlodipine 5 mg from her last hospital visit, this was never sent in. BP elevated at visit, pt afraid as amlodipine 'dropped' her BP last time she took it. I advised we monitor for now, she has a f/u with her cardiologist scheduled

## 2021-03-31 NOTE — ED Notes (Signed)
Pt requesting tylenol #3 for pain control, offered oxycodone IR and tylenol, pt denied. Secure chat sent to admitting MD
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
## 2021-03-31 NOTE — Consult Note (Signed)
NEUROLOGY CONSULTATION NOTE   Date of service: March 31, 2021 Patient Name: [name] MRN:  [id number] DOB:  03-13-1970 Reason for consult: "episodes of starring off" Requesting Provider: Terrilee Croak, MD _ _ _   _ __   _ __ _ _  __ __   _ __   __ _  History of Present Illness  [name] is a 50 y.o. female with PMH significant for CAD, GI bleed, DM2, CKD, previous stroke with left sided weakness and progressive memory impairment, recent admission for subclinical status epilepticus who presents with episode sof starring off.  No obvious trigger for these events. Had 6 of these today and quite a few over the last week. She could be doing anything, would then just stare off. She has little to no recollection of these events. Son wakes her up from these spells by calling her or yelling at her. She has no to minimal post ictal period and is back to her baseline.  She also reports that over the last week, she has had tremoring of her left side. Her left arm and Leg tremor but she is aware and can stop it.  She is on Keppra 1G BID and Vimpat 213m BID, this was recently reduced during her last admission about 2 weeks ago for worsening creatinine function.  She denies any sleep deprivation, no obvious signs or symptoms of URI, UTI or gastroenteritis.   ROS   Constitutional Denies weight loss, fever and chills.   HEENT Denies changes in vision and hearing.   Respiratory Denies SOB and cough.   CV Denies palpitations and CP   GI Denies abdominal pain, nausea, vomiting and diarrhea.   GU Denies dysuria and urinary frequency.   MSK Denies myalgia and joint pain.   Skin Denies rash and pruritus.   Neurological Denies headache and syncope.   Psychiatric Denies recent changes in mood. Denies anxiety and depression.    Past History   Past Medical History:  Diagnosis Date   Acute colitis 01/27/2017   Acute pyelonephritis    Acute upper GI bleed 01/25/2020   Anemia     iron deficiency anemia   Aortic arch aneurysm    BRCA negative 2014   CAD (coronary artery disease)    a. 08/2003 Cath: LAD 30-40-med Rx; b. 11/2014 PCI: LAD 93m3.25x23 Xience Alpine DES); c. 06/2015 PCI: D1 (2.25x12 Resolute Integrity DES); d. 06/2017 PCI: Patent mLAD stent, D2 95 (PTCA); e. 09/2017 PCI: D2 99ost (CBA); d. 12/2017 Cath: LM nl, LAD 3037m0d (small), D1 40ost, D2 95ost, LCX 40p, RCA 40ost/p->Med rx for D2 given restenosis.   Closed nondisplaced intertrochanteric fracture of left femur ([facility]/10/2018   Colitis 06/03/2015   Colon polyp    [place]A (cerebral vascular accident) ([facility]  Left side weakness.    Degenerative tear of glenoid labrum of right shoulder 03/15/2017   Diabetes mellitus without complication ([facility]  Family history of breast cancer    BRCA neg 2014   Femur fracture, left ([facility]/10/2018   Gastric ulcer 04/30/91/8101Helicobacter pylori infection 11/23/2014   History of echocardiogram    a. 03/2017 Echo: EF 60-65%, no rwma; b. 02/2018 Echo: EF 60-65%, no rwma. Nl RV fxn. No cardiac source of emboli (admitted w/ stroke).   Hypertension    Iron deficiency anemia secondary to blood loss (chronic)    Malignant melanoma of skin of scalp (HCC)    MI, acute,  non ST segment elevation (HCC)    Neuromuscular disorder (HCC)    S/P drug eluting coronary stent placement 06/04/2015   Sepsis ([place]) 03/18/2017   Sepsis secondary to UTI ([place]) 04/23/2020   Stroke ([place])    a. 02/2018 MRI: 10m late acute/early subacute L medial frontal lobe inarct; b. 02/2018 MRA No large vessel occlusion or aneurysm. Mod to sev L P2 stenosis. thready L vertebral artery, diffusely dzs'd; c. 02/2018 Carotid U/S: <50% bilat ICA dzs.   Unspecified atrial fibrillation ([place] [place] 04/23/2020   Past Surgical History:  Procedure Laterality Date   APPENDECTOMY     BALLOON ENTEROSCOPY  02/06/2020   [facility]   BIOPSY N/A 03/14/2020   Procedure: BIOPSY;  Surgeon: WLucilla Lame MD;  [place] [facility]  Service:  Endoscopy;  Laterality: N/A;   CARDIAC CATHETERIZATION N/A 11/09/2014   Procedure: Coronary Angiography;  Surgeon: TMinna Merritts MD;  [place];  Service: Cardiovascular;  Laterality: N/A;   CARDIAC CATHETERIZATION N/A 11/12/2014   Procedure: Coronary Stent Intervention;  Surgeon: AIsaias Cowman MD;  [facility];  Service: Cardiovascular;  Laterality: N/A;   CARDIAC CATHETERIZATION N/A 04/18/2015   Procedure: Left Heart Cath and Coronary Angiography;  Surgeon: TMinna Merritts MD;  [facility];  Service: Cardiovascular;  Laterality: N/A;   CARDIAC CATHETERIZATION Left 06/04/2015   Procedure: Left Heart Cath and Coronary Angiography;  Surgeon: MWellington Hampshire MD;  [place];  Service: Cardiovascular;  Laterality: Left;   CARDIAC CATHETERIZATION N/A 06/04/2015   Procedure: Coronary Stent Intervention;  Surgeon: MWellington Hampshire MD;  [place] [facility];  Service: Cardiovascular;  Laterality: N/A;   CESAREAN SECTION  2001   CHOLECYSTECTOMY N/A 11/18/2016   Procedure: LAPAROSCOPIC CHOLECYSTECTOMY WITH INTRAOPERATIVE CHOLANGIOGRAM;  Surgeon: SChristene Lye MD;  [facility];  Service: General;  Laterality: N/A;   COLONOSCOPY WITH PROPOFOL N/A 04/27/2016   Procedure: COLONOSCOPY WITH PROPOFOL;  Surgeon: DLucilla Lame MD;  [place]: [facility]  Service: Endoscopy;  Laterality: N/A;   COLONOSCOPY WITH PROPOFOL N/A 01/12/2018   Procedure: COLONOSCOPY WITH PROPOFOL;  Surgeon: Toledo, TBenay Pike MD;  Location: [facility];  Service: Endoscopy;  Laterality: N/A;   COLONOSCOPY WITH PROPOFOL N/A 03/14/2020   Procedure: COLONOSCOPY WITH PROPOFOL;  Surgeon: WLucilla Lame MD;  [place]  Service: Endoscopy;  Laterality: N/A;  Needs to be scheduled after 7:30 due to driver issues   CORONARY ANGIOPLASTY     CORONARY BALLOON ANGIOPLASTY N/A 06/29/2017   Procedure: CORONARY  BALLOON ANGIOPLASTY;  Surgeon: AWellington Hampshire MD;  [place] [facility];  Service: Cardiovascular;  Laterality: N/A;   CORONARY BALLOON ANGIOPLASTY N/A 09/20/2017   Procedure: CORONARY BALLOON ANGIOPLASTY;  Surgeon: AWellington Hampshire MD;  [facility];  Service: Cardiovascular;  Laterality: N/A;   CORONARY STENT INTERVENTION N/A 12/13/2019   Procedure: CORONARY STENT INTERVENTION;  Surgeon: AWellington Hampshire MD;  [facility];  Service: Cardiovascular;  Laterality: N/A;   DILATION AND CURETTAGE OF UTERUS     ESOPHAGOGASTRODUODENOSCOPY (EGD) WITH PROPOFOL N/A 09/14/2014   Procedure: ESOPHAGOGASTRODUODENOSCOPY (EGD) WITH PROPOFOL;  Surgeon: MJosefine Class MD;  [facility];  Service: Endoscopy;  Laterality: N/A;   ESOPHAGOGASTRODUODENOSCOPY (EGD) WITH PROPOFOL N/A 04/27/2016   Procedure: ESOPHAGOGASTRODUODENOSCOPY (EGD) WITH PROPOFOL;  Surgeon: DLucilla Lame MD;  [place] [facility]  Service: Endoscopy;  Laterality: N/A;  Diabetic - oral  meds   ESOPHAGOGASTRODUODENOSCOPY (EGD) WITH PROPOFOL N/A 01/12/2018   Procedure: ESOPHAGOGASTRODUODENOSCOPY (EGD) WITH PROPOFOL;  Surgeon: Toledo, Benay Pike, MD;  Location[facility];  Service: Endoscopy;  Laterality: N/A;   ESOPHAGOGASTRODUODENOSCOPY (EGD) WITH PROPOFOL N/A 04/11/2019   Procedure: ESOPHAGOGASTRODUODENOSCOPY (EGD) WITH PROPOFOL;  Surgeon: [name], MD;  Location[facility];  Service: Endoscopy;  Laterality: N/A;   ESOPHAGOGASTRODUODENOSCOPY (EGD) WITH PROPOFOL N/A 03/08/2020   Procedure: ESOPHAGOGASTRODUODENOSCOPY (EGD) WITH PROPOFOL;  Surgeon: [name], MD;  [facility];  Service: Endoscopy;  Laterality: N/A;   GASTRIC BYPASS  09/2009   [facility]    INTRAMEDULLARY (IM) NAIL INTERTROCHANTERIC Left 10/13/2018   Procedure: INTRAMEDULLARY (IM) NAIL INTERTROCHANTRIC;  Surgeon: Leandrew Koyanagi, MD;  Location: [place];  Service: Orthopedics;   Laterality: Left;   Left Carotid to sublcavian artery bypass w/ subclavian artery ligation     a. Performed @ [facility].   LEFT HEART CATH AND CORONARY ANGIOGRAPHY Left 06/29/2017   Procedure: LEFT HEART CATH AND CORONARY ANGIOGRAPHY;  Surgeon: Wellington Hampshire, MD;  Location: [place] [facility];  Service: Cardiovascular;  Laterality: Left;   LEFT HEART CATH AND CORONARY ANGIOGRAPHY N/A 09/20/2017   Procedure: LEFT HEART CATH AND CORONARY ANGIOGRAPHY;  Surgeon: Wellington Hampshire, MD;  Location: [place] CV LAB;  Service: Cardiovascular;  Laterality: N/A;   LEFT HEART CATH AND CORONARY ANGIOGRAPHY N/A 12/20/2017   Procedure: LEFT HEART CATH AND CORONARY ANGIOGRAPHY;  Surgeon: Wellington Hampshire, MD;  Location: [place] [facility] LAB;  Service: Cardiovascular;  Laterality: N/A;   LEFT HEART CATH AND CORONARY ANGIOGRAPHY N/A 04/20/2019   Procedure: LEFT HEART CATH AND CORONARY ANGIOGRAPHY possible PCI;  Surgeon: Minna Merritts, MD;  Location: [place] [facility] LAB;  Service: Cardiovascular;  Laterality: N/A;   LEFT HEART CATH AND CORONARY ANGIOGRAPHY N/A 12/13/2019   Procedure: LEFT HEART CATH AND CORONARY ANGIOGRAPHY;  Surgeon: Wellington Hampshire, MD;  Location: [place] [facility];  Service: Cardiovascular;  Laterality: N/A;   MELANOMA EXCISION  2016   Dr. Henreitta Cea ABLATION  2002   RIGHT OOPHORECTOMY     SHOULDER ARTHROSCOPY WITH OPEN ROTATOR CUFF REPAIR Right 01/07/2016   Procedure: SHOULDER ARTHROSCOPY WITH DEBRIDMENT, SUBACHROMIAL DECOMPRESSION;  Surgeon: Corky Mull, MD;  Location: [facility];  Service: Orthopedics;  Laterality: Right;   SHOULDER ARTHROSCOPY WITH OPEN ROTATOR CUFF REPAIR Right 03/16/2017   Procedure: SHOULDER ARTHROSCOPY WITH OPEN ROTATOR CUFF REPAIR POSSIBLE BICEPS TENODESIS;  Surgeon: Corky Mull, MD;  Location: [facility];  Service: Orthopedics;  Laterality: Right;   TRIGGER FINGER RELEASE Right     [place]   Family History  Problem Relation Age of Onset    Hypertension Mother    Anxiety disorder Mother    Depression Mother    Bipolar disorder Mother    Heart disease Mother        No details   Hyperlipidemia Mother    Kidney disease Father    Heart disease Father 88   Hypertension Father    Diabetes Father    Stroke Father    Colon cancer Father        dx in his 8's   Anxiety disorder Father    Depression Father    Skin cancer Father    Kidney disease Sister    Thyroid nodules Sister    Hypertension Sister    Hypertension Sister    Diabetes Sister    Hyperlipidemia Sister  Depression Sister    Breast cancer Maternal Aunt 50   Breast cancer Maternal Aunt 50   Ovarian cancer Cousin    Colon cancer Cousin    Kidney cancer Cousin    Breast cancer Other    Bladder Cancer Neg Hx    Social History   Socioeconomic History   Marital status: Divorced    Spouse name: Not on file   Number of children: 1   Years of education: Not on file   Highest education level: High school graduate  Occupational History   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 Disabled through 08/02/2013 per forms 02/03/2013  Tobacco Use   Smoking status: Former    Packs/day: 0.25    Years: 1.00    Pack years: 0.25    Types: Cigarettes    Quit date: 08/31/1994    Years since quitting: 26.6   Smokeless tobacco: Never   Tobacco comments:    quit 28 years ago  Vaping Use   Vaping Use: Never used  Substance and Sexual Activity   Alcohol use: No    Alcohol/week: 0.0 standard drinks   Drug use: No   Sexual activity: Not Currently    Birth control/protection: None, Surgical    Comment: Ablation  Other Topics Concern   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   Lives at home with son   Social Determinants of Health   Financial Resource Strain: Low Risk     Difficulty of Paying Living Expenses: Not hard at all  Food Insecurity: No Food Insecurity   Worried About Charity fundraiser in the Last Year: Never true   Arboriculturist in the Last Year: Never true  Transportation Needs: No Transportation Needs   Lack of Transportation (Medical): No   Lack of Transportation (Non-Medical): No  Physical Activity: Not on file  Stress: Stress Concern Present   Feeling of Stress : Rather much  Social Connections: Moderately Integrated   Frequency of Communication with Friends and Family: More than three times a week   Frequency of Social Gatherings with Friends and Family: Twice a week   Attends Religious Services: More than 4 times per year   Active Member of [employer] or Organizations: Yes   Attends Music therapist: More than 4 times per year   Marital Status: Divorced   Allergies  Allergen Reactions   Lipitor [Atorvastatin] Other (See Comments)    Leg pains    Medications  (Not in a hospital admission)    Vitals   Vitals:   03/31/21 1312 03/31/21 1632  BP: (!) 172/99 (!) 163/82  Pulse: 88 69  Resp: 18 16  Temp: 98.4 F (36.9 C)   TempSrc: Oral   SpO2: 98% 99%     There is no height or weight on file to calculate BMI.  Physical Exam   General: Laying comfortably in bed; in no acute distress.  HENT: Normal oropharynx and mucosa. Normal external appearance of ears and nose.  Neck: Supple, no pain or tenderness  CV: No JVD. No peripheral edema.  Pulmonary: Symmetric Chest rise. Normal respiratory effort.  Abdomen: Soft to touch, non-tender.  Ext: No cyanosis, edema, or deformity  Skin: No rash. Normal palpation of skin.   Musculoskeletal: Normal digits and nails by  inspection. No clubbing.   Neurologic Examination  Mental status/Cognition: Alert, oriented to self, place, month and year, good attention. Speech/language: Fluent, comprehension intact, object naming intact, repetition intact.  Cranial nerves:   CN II  Pupils equal and reactive to light, no VF deficits    CN III,IV,VI EOM intact, no gaze preference or deviation, no nystagmus    CN V normal sensation in V1, V2, and V3 segments bilaterally    CN VII no asymmetry, no nasolabial fold flattening    CN VIII normal hearing to speech    CN IX & X normal palatal elevation, no uvular deviation   CN XI 5/5 head turn and 5/5 shoulder shrug bilaterally    CN XII midline tongue protrusion    Motor:  Muscle bulk: normal, tone normal, pronator drift none tremor none Mvmt Root Nerve  Muscle Right Left Comments  SA C5/6 Ax Deltoid 5 5   EF C5/6 Mc Biceps 5 5   EE C6/7/8 Rad Triceps 5 5   WF C6/7 Med FCR     WE C7/8 PIN ECU     F Ab C8/T1 U ADM/FDI 5 5   HF L1/2/3 Fem Illopsoas 5 5   KE L2/3/4 Fem Quad 5 5   DF L4/5 D Peron Tib Ant 5 5   PF S1/2 Tibial Grc/Sol 5 5    Reflexes:  Right Left Comments  Pectoralis      Biceps (C5/6) 1 1   Brachioradialis (C5/6) 1 1    Triceps (C6/7) 1 1    Patellar (L3/4) 1 1    Achilles (S1)      Hoffman      Plantar     Jaw jerk    Sensation:  Light touch Intact throughout   Pin prick    Temperature    Vibration   Proprioception    Coordination/Complex Motor:  - Finger to Nose intact BL - Heel to shin intact BL - Rapid alternating movement are normal - Gait: deferred for patient safety.  Labs   CBC:  Recent Labs  Lab 03/31/21 1329  WBC 9.6  NEUTROABS 7.6  HGB 9.8*  HCT 31.5*  MCV 89.7  PLT 299    Basic Metabolic Panel:  Lab Results  Component Value Date   NA 145 03/31/2021   K 4.7 03/31/2021   CO2 26 03/31/2021   GLUCOSE 131 (H) 03/31/2021   BUN 19 03/31/2021   CREATININE 1.62 (H) 03/31/2021   CALCIUM 8.5 (L) 03/31/2021   GFRNONAA 38 (L) 03/31/2021   GFRAA 29 (L) 03/13/2020   Lipid Panel:  Lab Results  Component Value Date   [place] 95 02/08/2021   HgbA1c:  Lab Results  Component Value Date   HGBA1C 5.6 03/09/2021   Urine Drug Screen:     Component Value Date/Time    LABOPIA NONE DETECTED 03/09/2021 0211   COCAINSCRNUR NONE DETECTED 03/09/2021 0211   COCAINSCRNUR NONE DETECTED 02/07/2021 2229   LABBENZ POSITIVE (A) 03/09/2021 0211   AMPHETMU NONE DETECTED 03/09/2021 0211   THCU NONE DETECTED 03/09/2021 0211   LABBARB NONE DETECTED 03/09/2021 0211    Alcohol Level     Component Value Date/Time   ETH <10 03/31/2021 1642    CT Head without contrast(Personally reviewed): CTH was negative for a large hypodensity concerning for a large territory infarct or hyperdensity concerning for an ICH.  cEEG:  pending  Impression   [name] is a 51 y.o. female with PMH significant for  CAD, GI bleed, DM2, CKD, previous stroke with left sided weakness and progressive memory impairment, recent admission for subclinical status epilepticus who presents with episodes of starring off. Her neurologic examination is notable for no focal deficit. She is having multiple episodes per day with no clear trigger or provoking factor. Given her recent focal subclinical status epilepticus, will put her up on cEEG with hope that we can capture and characterize these events.  Would continue her home AEDs in the meantime.  Recommendations  - continue Keppra 1G BID and Vimpat 242m BID - cEEG for spell capture. - Seizure precautions with seizure pads. - We will continue to follow along. _________________________________________________________________  Thank you for the opportunity to take part in the care of this patient. If you have any further questions, please contact the neurology consultation attending.  Signed,  SEekPager Number [phone]_ _ _   _ __   _ __ _ _  __ __   _ __   __ _

## 2021-03-31 NOTE — ED Triage Notes (Signed)
Patient coming from home, complaint of absent seizures x1 week. Pt states has had several instances of staring off per son.

## 2021-03-31 NOTE — ED Provider Notes (Addendum)
[facility] EMERGENCY DEPARTMENT Provider Note   CSN: [id number] Arrival date & time: 03/31/21  1300     History  Chief Complaint  Patient presents with   Seizures    Absent seizures    [name] is a 51 y.o. female w/ hx of CVA, Migraine HAs and recent dx of Absence  seizures in January. Patient is on Vimpat and Keppra. She states that she is compliant with the medication. She was admitted for the same early Februrary for status epilepticus, hypertensive emergency. Hypertensive/ metabolic encephalopathy.. Patient states that she had 4 seizures today. She is here because she feels that her seizures are increasing in frequency. She had a fall yesterday and hit her head. She is on Eliquis. She has not yet followed up with Neurology. She saw her pcp today who sent her to the ED.   Seizures     Home Medications Prior to Admission medications   Medication Sig Start Date End Date Taking? Authorizing Provider  AIMOVIG 140 MG/ML SOAJ Inject 140 mg into the skin every 28 (twenty-eight) days. 10/14/20   [provider]  ALPRAZolam Duanne Moron) 1 MG tablet Take 1 tablet by mouth twice daily Patient taking differently: Take 1 mg by mouth 2 (two) times daily as needed for anxiety. 11/26/20   [name], MD  aspirin EC 81 MG EC tablet Take 1 tablet (81 mg total) by mouth daily. Swallow whole. 12/10/20 02/09/22  Elodia Florence., MD  Baclofen 5 MG TABS Take 5 mg by mouth 2 (two) times daily as needed (muscle spasms). 11/26/20   [provider]  carvedilol (COREG) 12.5 MG tablet Take 1 tablet by mouth twice daily Patient taking differently: 12.5 mg 2 (two) times daily with a meal. 08/21/20   Gollan, Kathlene November, MD  clopidogrel (PLAVIX) 75 MG tablet Take 1 tablet (75 mg total) by mouth daily. 11/20/20   Furth, Cadence H, PA-C  desvenlafaxine (PRISTIQ) 50 MG 24 hr tablet Take 50 mg by mouth daily. 11/26/20   [provider]  diclofenac  Sodium (VOLTAREN) 1 % GEL Apply 2 g topically 4 (four) times daily as needed (joint paim).    [provider]  diphenoxylate-atropine (LOMOTIL) 2.5-0.025 MG tablet Take 1 tablet by mouth 4 (four) times daily as needed for diarrhea or loose stools. 01/07/21   [name], MD  estradiol (ESTRACE) 0.1 MG/GM vaginal cream Place 1 Applicatorful vaginally 3 (three) times a week. Estrogen Cream Instruction Discard applicator Apply pea sized amount to tip of finger to urethra before bed. Wash hands well after application. Use Monday, Wednesday and Friday 03/11/21 03/11/22  Billey Co, MD  furosemide (LASIX) 20 MG tablet Take 1 tablet by mouth every day as needed for swelling Patient taking differently: Take 20 mg by mouth daily as needed (swelling). 12/13/20   Furth, Cadence H, PA-C  gabapentin (NEURONTIN) 300 MG capsule Take 2 capsules (600 mg total) by mouth 2 (two) times daily. 07/24/20   [name], MD  glucose 4 GM chewable tablet Chew 1 tablet (4 g total) by mouth 3 (three) times daily as needed for low blood sugar. Home med. 02/08/21   Enzo Bi, MD  isosorbide mononitrate (IMDUR) 120 MG 24 hr tablet Take 1 tablet by mouth every day Patient taking differently: Take 120 mg by mouth daily. 01/28/21   Minna Merritts, MD  lacosamide (VIMPAT) 200 MG TABS tablet Take 1 tablet (200 mg total) by mouth 2 (  two) times daily. 03/20/21   Patrecia Pour, MD  levETIRAcetam (KEPPRA) 1000 MG tablet Take 1 tablet (1,000 mg total) by mouth 2 (two) times daily. 03/20/21   Patrecia Pour, MD  levothyroxine (SYNTHROID) 25 MCG tablet Take 1 tablet by mouth 30 minutes before breakfast Patient taking differently: Take 25 mcg by mouth daily before breakfast. 11/26/20   [name], MD  mirabegron ER (MYRBETRIQ) 50 MG TB24 tablet Take 1 tablet (50 mg total) by mouth daily. 02/17/21   Bjorn Loser, MD  mirtazapine (REMERON) 7.5 MG tablet Take 1 tablet (7.5 mg total) by mouth at bedtime. 03/20/21   Patrecia Pour, MD  nitrofurantoin (MACRODANTIN) 100 MG capsule Hold while on IV antibiotic. Patient taking differently: Take 100 mg by mouth 2 (two) times daily. 02/08/21   Enzo Bi, MD  nitroGLYCERIN (NITROSTAT) 0.4 MG SL tablet Dissolve 1 tablet under tongue every 104mins, up to 3 doses, for chest pain. Max 3 doses in 72mins. Patient taking differently: Place 0.4 mg under the tongue every 5 (five) minutes x 3 doses as needed for chest pain. 12/30/20   Minna Merritts, MD  pantoprazole (PROTONIX) 40 MG tablet Take 1 tablet (40 mg total) by mouth 2 (two) times daily. 12/20/20 02/09/22  [name], MD  Polyethyl Glycol-Propyl Glycol (SYSTANE OP) Place 1 drop into both eyes daily as needed (dry eyes).    [provider]  promethazine (PHENERGAN) 25 MG tablet Take 1 tablet (25 mg total) by mouth every 6 (six) hours as needed for nausea or vomiting. 07/02/20   [name], MD  Rimegepant Sulfate (NURTEC) 75 MG TBDP Take 75 mg by mouth as needed. Patient taking differently: Take 75 mg by mouth as needed (migraine). 01/17/21   Marcial Pacas, MD  rosuvastatin (CRESTOR) 40 MG tablet Take 1 tablet (40 mg total) by mouth daily at 6 PM. 10/25/19   Gollan, Kathlene November, MD      Allergies    Lipitor [atorvastatin]    Review of Systems   Review of Systems  Neurological:  Positive for seizures.   Physical Exam Updated Vital Signs BP (!) 172/99    Pulse 88    Temp 98.4 F (36.9 C) (Oral)    Resp 18    SpO2 98%  Physical Exam Vitals and nursing note reviewed.  Constitutional:      General: She is not in acute distress.    Appearance: She is well-developed. She is not diaphoretic.  HENT:     Head: Normocephalic and atraumatic.     Right Ear: External ear normal.     Left Ear: External ear normal.     Nose: Nose normal.     Mouth/Throat:     Mouth: Mucous membranes are moist.  Eyes:     General: No scleral icterus.    Conjunctiva/sclera: Conjunctivae normal.  Cardiovascular:     Rate and Rhythm:  Normal rate and regular rhythm.     Heart sounds: Normal heart sounds. No murmur heard.   No friction rub. No gallop.  Pulmonary:     Effort: Pulmonary effort is normal. No respiratory distress.     Breath sounds: Normal breath sounds.  Abdominal:     General: Bowel sounds are normal. There is no distension.     Palpations: Abdomen is soft. There is no mass.     Tenderness: There is no abdominal tenderness. There is no guarding.  Musculoskeletal:     Cervical back: Normal  range of motion.  Skin:    General: Skin is warm and dry.  Neurological:     Mental Status: She is alert and oriented to person, place, and time. Mental status is at baseline.     Cranial Nerves: No cranial nerve deficit.     Sensory: No sensory deficit.     Motor: Weakness (chronic) present.     Gait: Gait normal.     Deep Tendon Reflexes: Reflexes normal.  Psychiatric:        Behavior: Behavior normal.    ED Results / Procedures / Treatments   Labs (all labs ordered are listed, but only abnormal results are displayed) Labs Reviewed  COMPREHENSIVE METABOLIC PANEL - Abnormal; Notable for the following components:      Result Value   Glucose, Bld 131 (*)    Creatinine, Ser 1.62 (*)    Calcium 8.5 (*)    Total Protein 6.2 (*)    Albumin 3.1 (*)    GFR, Estimated 38 (*)    All other components within normal limits  CBC WITH DIFFERENTIAL/PLATELET - Abnormal; Notable for the following components:   RBC 3.51 (*)    Hemoglobin 9.8 (*)    HCT 31.5 (*)    RDW 15.8 (*)    All other components within normal limits  MAGNESIUM  PHOSPHORUS  ETHANOL  LEVETIRACETAM LEVEL  I-STAT BETA HCG BLOOD, ED ([facility], [facility], [facility] ONLY)  CBG MONITORING, ED    EKG None  Radiology No results found.  Procedures Procedures    Medications Ordered in ED Medications - No data to display  ED Course/ Medical Decision Making/ A&P Clinical Course as of 03/31/21 1923  Mon Mar 31, 2021  1541 Comprehensive metabolic panel(!) CMP  reviewed, elevated serum creatinine appears to be baseline for patient [AH]  1541 CBC with Differential/Platelet(!) CBC shows that patient's hemoglobin has dropped almost 2 g in the last 2 weeks.Patient denies black or bloody stools. [AH]  1542 EKG 12-Lead I interpreted patient's EKG.  Shows normal sinus rhythm at a rate of 82 without other abnormalities. [AH]  1543 Magnesium [AH]  1543 I-Stat beta hCG blood, ED [AH]  1638 Creatinine(!): 1.62 [DN]    Clinical Course User Index [AH] Margarita Mail, PA-C [DN] Doylene Bode, Student-PA                           Medical Decision Making 51 year old female here with repetitive seizures The differential diagnosis for includes but is not limited to idiopathic seizure, traumatic brain injury, intracranial hemorrhage, vascular lesion, mass or space containing lesion, degenerative neurologic disease, congenital brain abnormality, infectious etiology such as meningitis, encephalitis or abscess, metabolic disturbance including hyper or hypoglycemia, hyper or hyponatremia, hyperosmolar state, uremia, hepatic failure, hypocalcemia, hypomagnesemia.  Toxic substances such as cocaine, lidocaine, antidepressants, theophylline, alcohol withdrawal, drug withdrawal, eclampsia, hypertensive encephalopathy and anoxic brain injury.  Patient case discussed with Dr. Lorrin Goodell who is familiar with her this patient.  He states that she will need to be brought back in with continuous EEG monitoring.  Case discussed with Dr. Horton Chin of the [facility] who admit the patient.  No active seizure activity here in the emergency department.  Amount and/or Complexity of Data Reviewed Independent Historian:     Details: Patient son at bedside Labs: ordered. Decision-making details documented in ED Course. Radiology: ordered and independent interpretation performed.    Details: Reviewed CT of the head which shows no acute findings  ECG/medicine tests: independent  interpretation performed. Decision-making details documented in ED Course.  Risk Decision regarding hospitalization.           Final Clinical Impression(s) / ED Diagnoses Final diagnoses:  None    Rx / DC Orders ED Discharge Orders     None         Margarita Mail, PA-C 03/31/21 2342    Lorelle Gibbs, DO 03/31/21 2348    Margarita Mail, PA-C 04/11/21 1940    Horton, Alvin Critchley, DO 04/12/21 1546

## 2021-04-01 ENCOUNTER — Inpatient Hospital Stay (HOSPITAL_COMMUNITY): Payer: Medicare Other

## 2021-04-01 ENCOUNTER — Encounter (HOSPITAL_COMMUNITY): Payer: Self-pay | Admitting: Internal Medicine

## 2021-04-01 LAB — BASIC METABOLIC PANEL
Anion gap: 9 (ref 5–15)
BUN: 19 mg/dL (ref 6–20)
CO2: 25 mmol/L (ref 22–32)
Calcium: 8.4 mg/dL — ABNORMAL LOW (ref 8.9–10.3)
Chloride: 110 mmol/L (ref 98–111)
Creatinine, Ser: 1.56 mg/dL — ABNORMAL HIGH (ref 0.44–1.00)
GFR, Estimated: 40 mL/min — ABNORMAL LOW (ref >60)
Glucose, Bld: 84 mg/dL (ref 70–99)
Potassium: 5.1 mmol/L (ref 3.5–5.1)
Sodium: 144 mmol/L (ref 135–145)

## 2021-04-01 LAB — CBG MONITORING, ED
Glucose-Capillary: 87 mg/dL (ref 70–99)
Glucose-Capillary: 77 mg/dL (ref 70–99)

## 2021-04-01 LAB — GLUCOSE, CAPILLARY
Glucose-Capillary: 218 mg/dL — ABNORMAL HIGH (ref 70–99)
Glucose-Capillary: 96 mg/dL (ref 70–99)

## 2021-04-01 LAB — CBC
HCT: 27.4 % — ABNORMAL LOW (ref 36.0–46.0)
Hemoglobin: 8.5 g/dL — ABNORMAL LOW (ref 12.0–15.0)
MCH: 27.9 pg (ref 26.0–34.0)
MCHC: 31 g/dL (ref 30.0–36.0)
MCV: 89.8 fL (ref 80.0–100.0)
Platelets: 124 10*3/uL — ABNORMAL LOW (ref 150–400)
RBC: 3.05 MIL/uL — ABNORMAL LOW (ref 3.87–5.11)
RDW: 15.9 % — ABNORMAL HIGH (ref 11.5–15.5)
WBC: 5.9 10*3/uL (ref 4.0–10.5)
nRBC: 0 % (ref 0.0–0.2)

## 2021-04-01 IMAGING — US US RENAL ARTERY STENOSIS
1 series · 14 of 25 positions shown · non-contrast
Comparison: None.

CLINICAL DATA: Labile hypertension

EXAM:
RENAL/URINARY TRACT ULTRASOUND
RENAL DUPLEX DOPPLER ULTRASOUND

[Series 1: us renal artery duplex complete · 14 of 73 slices shown]
[im 1/73]
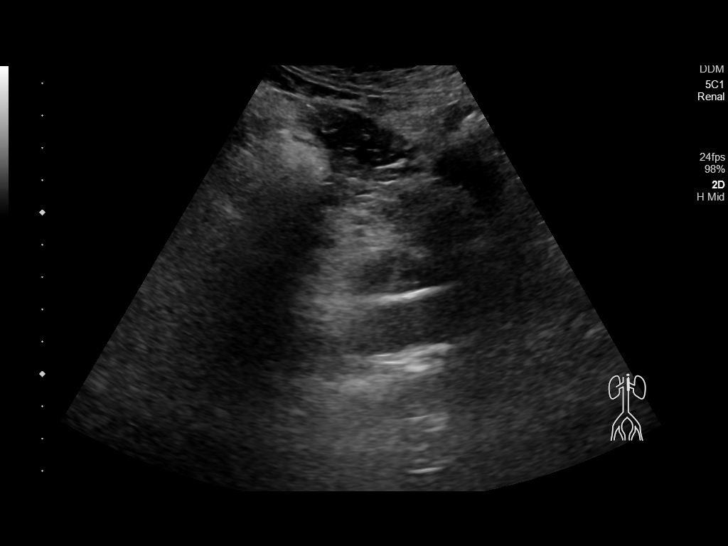
[im 7/73]
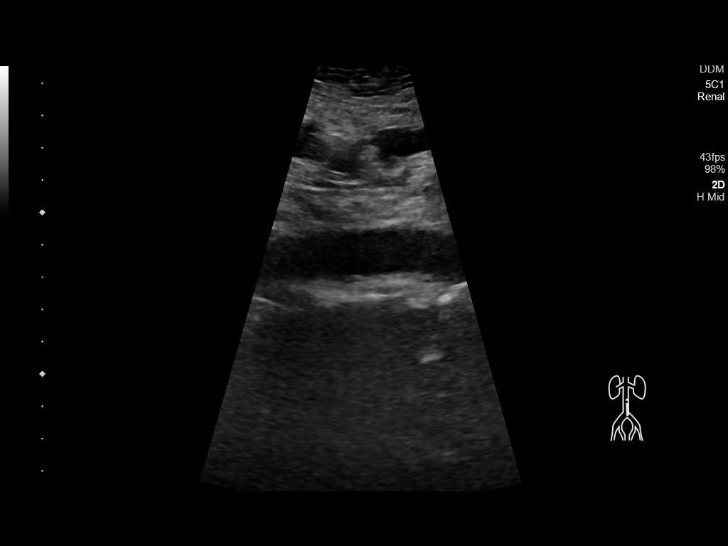
[im 13/73]
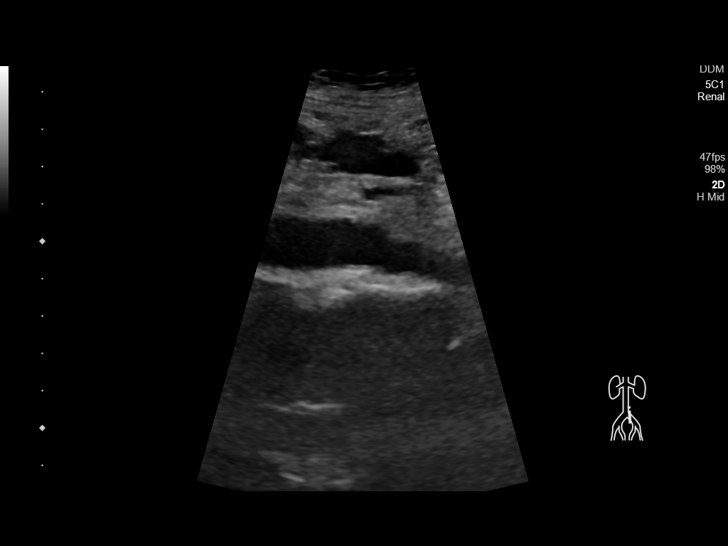
[im 19/73]
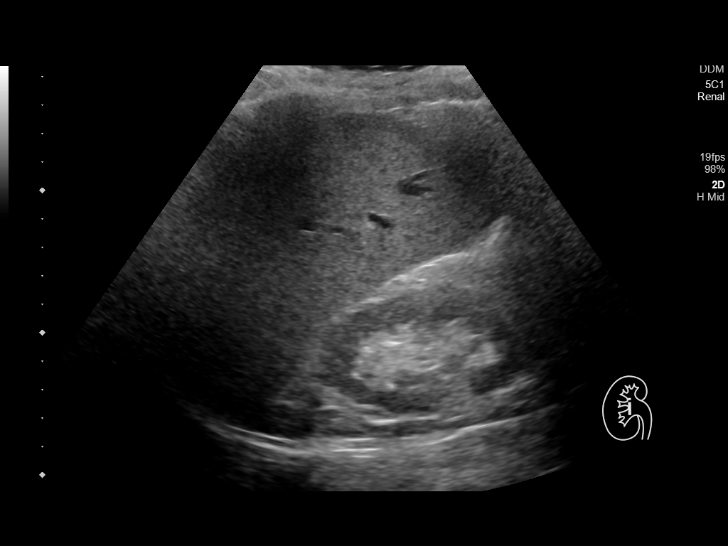
[im 25/73]
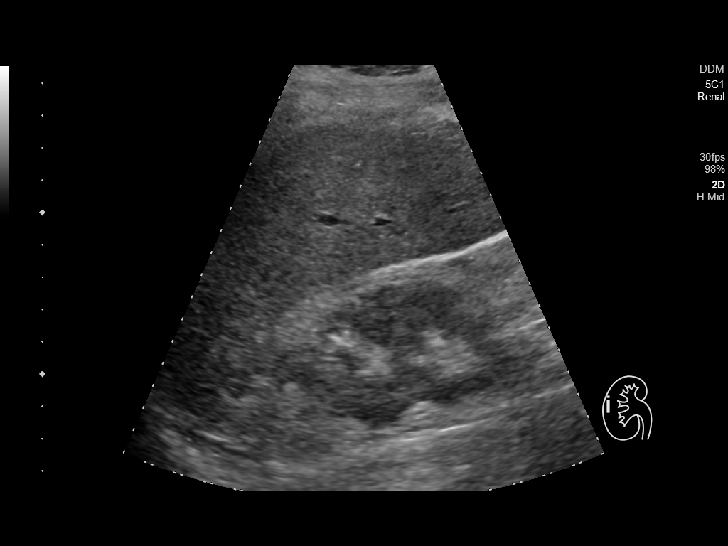
[im 28/73]
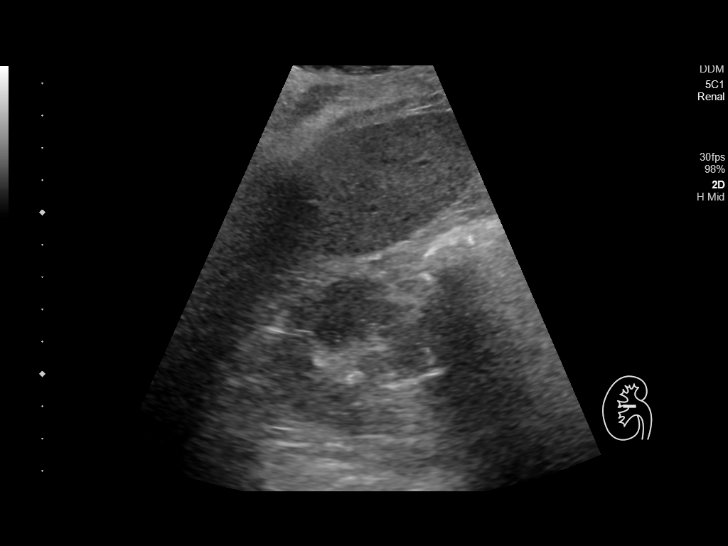
[im 34/73]
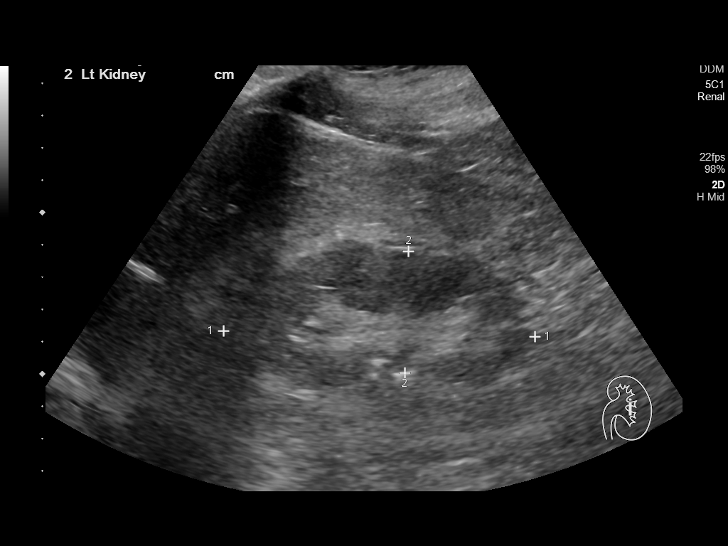
[im 40/73]
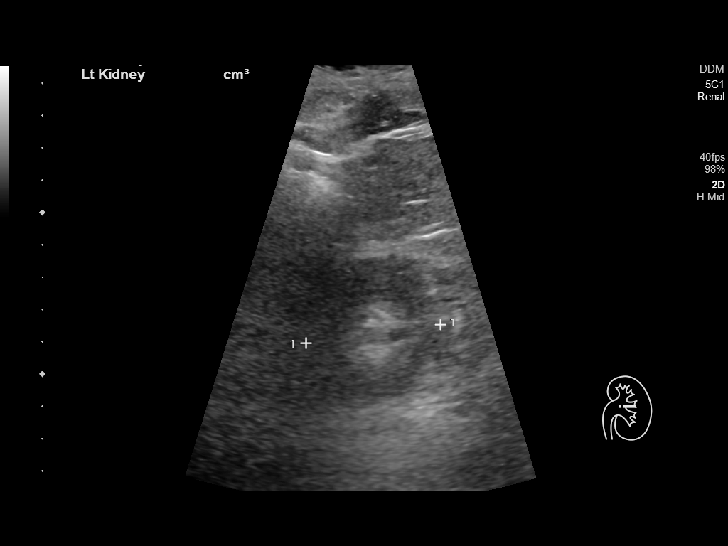
[im 46/73]
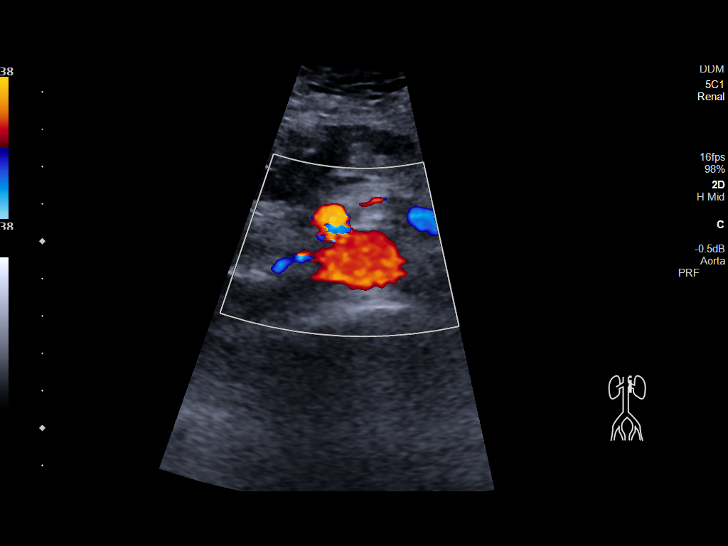
[im 49/73]
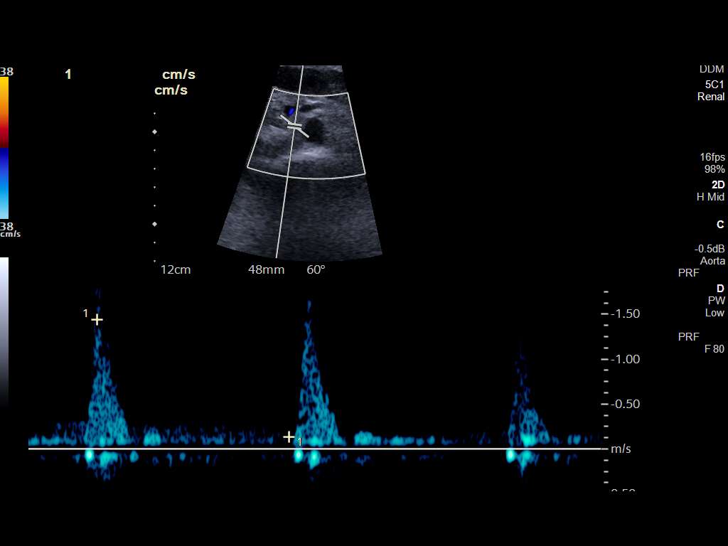
[im 55/73]
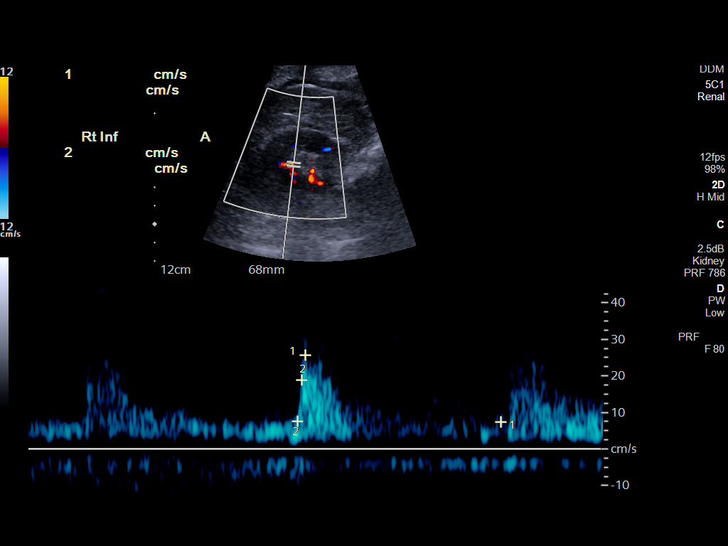
[im 61/73]
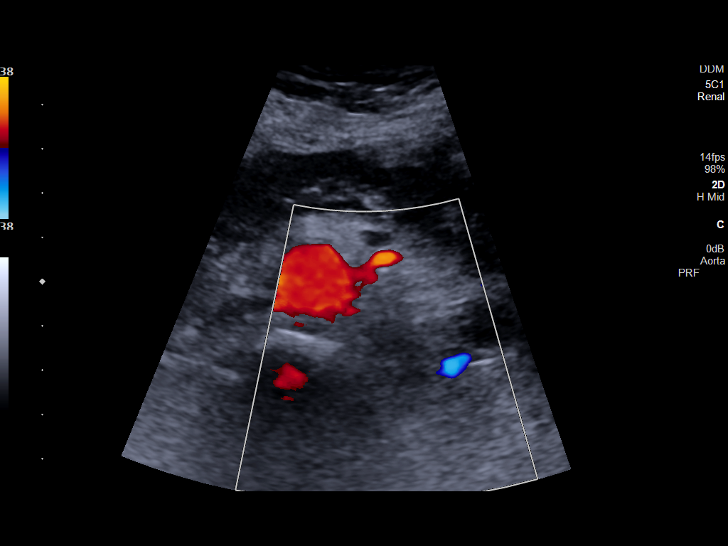
[im 67/73]
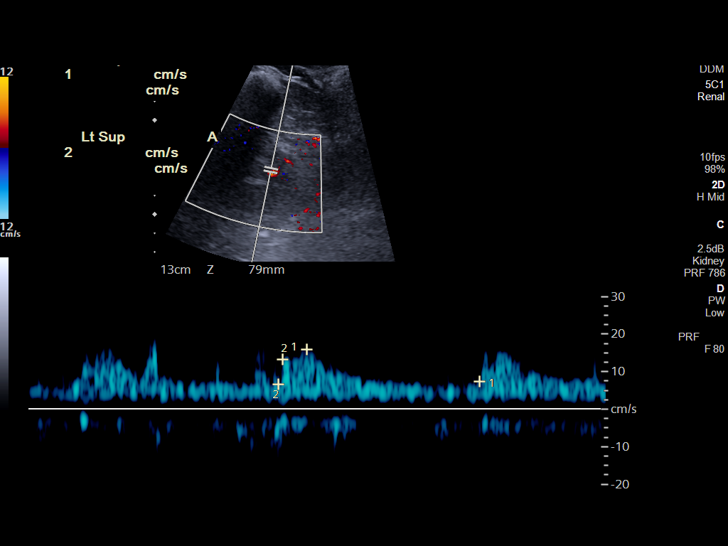
[im 73/73]
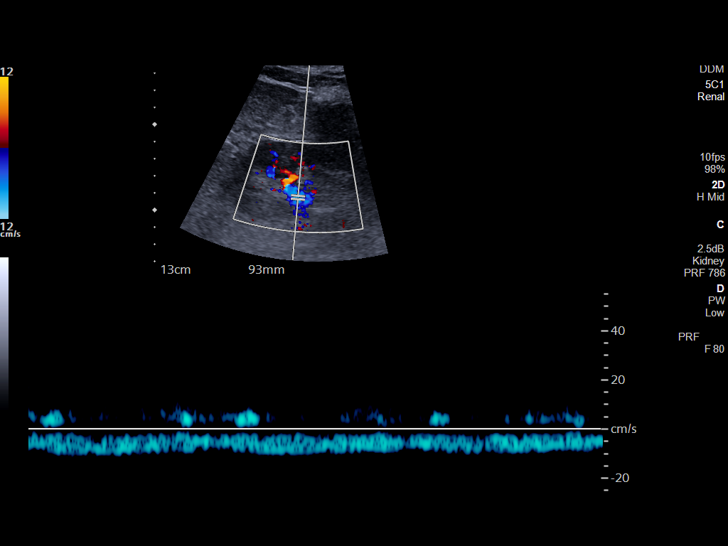

[14 of 25 positions shown; findings below may reference images not displayed]

FINDINGS: Right Kidney:

Length: 8.6. Echogenicity within normal limits. No mass or
hydronephrosis visualized.

Left Kidney:

Length: 9.6. Echogenicity within normal limits. No mass or
hydronephrosis visualized.

Bladder:  Normal appearance by ultrasound

RENAL DUPLEX ULTRASOUND

Right Renal Artery Velocities:

Origin:  144 cm/sec

Mid:  91 cm/sec

Hilum:  26 cm/sec

Interlobar:  25 cm/sec

Arcuate:  12 cm/sec

Left Renal Artery Velocities:

Origin:  87 cm/sec

Mid:  105 cm/sec

Hilum:  30 cm/sec

Interlobar:  21 cm/sec

Arcuate:  15 cm/sec

Aortic Velocity:  60 cm/sec

Right Renal-Aortic Ratios:

Origin:

Mid:

Hilum:

Interlobar:

Arcuate:

Left Renal-Aortic Ratios:

Origin:

Mid:

Hilum:

Interlobar:

Arcuate:

No acute renal abnormality by ultrasound. No abnormal renal artery
velocity or ratio to suggest significant renal artery stenosis by
renal duplex. Bladder unremarkable. Renal veins appear patent.
IMPRESSION: Negative for significant renal artery stenosis by duplex.

No acute renal abnormality.

## 2021-04-01 MED ORDER — AMLODIPINE BESYLATE 5 MG PO TABS: 5.0000 mg | ORAL_TABLET | Freq: Every day | ORAL | Status: DC

## 2021-04-01 MED ORDER — ALBUTEROL SULFATE (2.5 MG/3ML) 0.083% IN NEBU: 2.5000 mg | INHALATION_SOLUTION | RESPIRATORY_TRACT | Status: DC | PRN

## 2021-04-01 NOTE — Progress Notes (Signed)
LTM EEG maint complete.

## 2021-04-01 NOTE — ED Notes (Signed)
Pt requested to use the wheelchair to go to the bathroom. Pt was placed in wheelchair and it was successful

## 2021-04-01 NOTE — Progress Notes (Signed)
LTM EEG hooked up and running - no initial skin breakdown - push button tested - neuro notified.  

## 2021-04-01 NOTE — Progress Notes (Signed)
NEUROLOGY CONSULTATION PROGRESS NOTE   Date of service: April 01, 2021 Patient Name: [name] MRN:  [id number] DOB:  [birth year]  Brief HPI   Sunday Marita [place] [name] is a 51 y.o. female with PMH significant for CAD, GI bleed, DM2, CKD, previous stroke with left sided weakness and progressive memory impairment, recent admission for subclinical status epilepticus who presents with episodes of starring off. Her neurologic examination is notable for no focal deficit. She is having multiple episodes per day with no clear trigger or provoking factor. Given her recent focal subclinical status epilepticus, will put her up on cEEG with hope that we can capture and characterize these events.   Would continue her home AEDs in the meantime.   Interval Hx   No spells of interest so far.  Vitals   Vitals:   04/01/21 0900 04/01/21 1000 04/01/21 1100 04/01/21 1200  BP: (!) 141/64 (!) 161/73 134/81 (!) 160/79  Pulse: 85 71 62 65  Resp: 15 14 14 13   Temp:      TempSrc:      SpO2: 96% 100% 98% 98%     There is no height or weight on file to calculate BMI.  Physical Exam   General: Laying comfortably in bed; in no acute distress.  HENT: Normal oropharynx and mucosa. Normal external appearance of ears and nose.  Neck: Supple, no pain or tenderness  CV: No JVD. No peripheral edema.  Pulmonary: Symmetric Chest rise. Normal respiratory effort.  Abdomen: Soft to touch, non-tender.  Ext: No cyanosis, edema, or deformity  Skin: No rash. Normal palpation of skin.   Musculoskeletal: Normal digits and nails by inspection. No clubbing.   Neurologic Examination  Mental status/Cognition: Alert, oriented to self, place, month and year, good attention.  Speech/language: Fluent, comprehension intact, object naming intact, repetition intact.  Cranial nerves:   CN II Pupils equal and reactive to light, no VF deficits    CN III,IV,VI EOM intact, no gaze preference or deviation, no nystagmus     CN V normal sensation in V1, V2, and V3 segments bilaterally    CN VII no asymmetry, no nasolabial fold flattening    CN VIII normal hearing to speech    CN IX & X normal palatal elevation, no uvular deviation    CN XI 5/5 head turn and 5/5 shoulder shrug bilaterally    CN XII midline tongue protrusion    Motor:  Muscle bulk: normal, tone normal Mvmt Root Nerve  Muscle Right Left Comments  SA C5/6 Ax Deltoid     EF C5/6 Mc Biceps 5 5   EE C6/7/8 Rad Triceps 5 5   WF C6/7 Med FCR     WE C7/8 PIN ECU     F Ab C8/T1 U ADM/FDI 5 5   HF L1/2/3 Fem Illopsoas 5 5   KE L2/3/4 Fem Quad     DF L4/5 D Peron Tib Ant     PF S1/2 Tibial Grc/Sol       Sensation:  Light touch    Pin prick    Temperature    Vibration   Proprioception    Coordination/Complex Motor:  - Finger to Nose intact BL  Labs   Basic Metabolic Panel:  Lab Results  Component Value Date   NA 144 04/01/2021   K 5.1 04/01/2021   CO2 25 04/01/2021   GLUCOSE 84 04/01/2021   BUN 19 04/01/2021   CREATININE 1.56 (H) 04/01/2021   CALCIUM 8.4 (L)  04/01/2021   GFRNONAA 40 (L) 04/01/2021   GFRAA 29 (L) 03/13/2020   HbA1c:  Lab Results  Component Value Date   HGBA1C 5.6 03/09/2021   LDL:  Lab Results  Component Value Date   LDLCALC 95 02/08/2021   Urine Drug Screen:     Component Value Date/Time   LABOPIA NONE DETECTED 03/09/2021 0211   COCAINSCRNUR NONE DETECTED 03/09/2021 0211   COCAINSCRNUR NONE DETECTED 02/07/2021 2229   LABBENZ POSITIVE (A) 03/09/2021 0211   AMPHETMU NONE DETECTED 03/09/2021 0211   THCU NONE DETECTED 03/09/2021 0211   LABBARB NONE DETECTED 03/09/2021 0211    Alcohol Level     Component Value Date/Time   ETH <10 03/31/2021 1642   Lab Results  Component Value Date   LEVETIRACETA 52.2 (H) 03/15/2021   No results found for: PHENYTOIN, PHENOBARB, VALPROATE, CBMZ  Imaging and Diagnostic studies   CT Head Wo Contrast  No acute intracranial abnormality seen.    Impression    [name] is a 51 y.o. female with PMH significant for CAD, GI bleed, DM2, CKD, previous stroke with left sided weakness and progressive memory impairment, recent admission for subclinical status epilepticus who presents with episodes of starring off. Her neurologic examination is notable for no focal deficit. She is having multiple episodes per day with no clear trigger or provoking factor. Given her recent focal subclinical status epilepticus, we put her up on cEEG with hope that we can capture and characterize these events.   No events captured so far.  Recommendations  - continue Keppra 1G BID and Vimpat 200mg  BID - cEEG for spell capture. - Seizure precautions with seizure pads. - We will continue to follow along. ______________________________________________________________________   Thank you for the opportunity to take part in the care of this patient. If you have any further questions, please contact the neurology consultation attending.  Signed,  [place] Pager Number [phone]

## 2021-04-01 NOTE — Plan of Care (Signed)

## 2021-04-01 NOTE — ED Notes (Signed)
Pt had large BM. Cleaned and changed.

## 2021-04-01 NOTE — ED Notes (Signed)
Help get patient cleaned up placed a per wick and put a brief on patient is resting with call bell in reach

## 2021-04-01 NOTE — Progress Notes (Signed)
PROGRESS NOTE  [place]  DOB: 1970/11/10  PCP: [name], MD JSH:[id number]  DOA: 03/31/2021  LOS: 1 day  Hospital Day: 2  Brief narrative: [name] is a 51 y.o. female with PMH significant for DM2, HTN, HLD, A-fib, CAD/multiple stents, CVA with residual right-sided weakness, gastric ulcer, history of GI bleed, iron deficiency anemia, hypothyroidism, neuromuscular disorder, recently admitted for seizure. Patient was brought to the ED today with complaint of several instances of absence seizure's at home.   In the ED, patient was afebrile, heart rate in 60s, blood pressure 172/99, breathing on room air Labs with creatinine elevated to 1.62, hemoglobin 9.8 CT scan of head did not show any acute intracranial abnormality.   Of note, patient was recently hospitalized from 2/4 to 2/14 for status epilepticus with absent seizures, metabolic encephalopathy, hypertensive emergency, UTI.  Insurance denied CIR, she was recommended for discharge to SNF but she declined and went home with PT, OT and aide.  She was discharged on Vimpat 200 mg twice daily, Keppra 1000 mg twice daily.  She went to live with her mom who was essentially taking care of her.  Her son is a Agricultural consultant and unable to be available for her.  She hopes that she gets approved for CIR at this time.  Subjective: Patient was seen and examined this morning.  Pleasant middle-aged [name].  Lying on bed.  Getting continuous EEG monitoring.  No new symptoms.  No new episode of seizure overnight.  Principal Problem:   Seizure ([place])    Assessment and Plan: Recurrent absent seizures -Patient was brought to the ED for recurrent episodes of blank stares at home.  She was recently admitted for status epilepticus from absent seizures. She showed clinical and electrographic improvement with Keppra and Vimpat and was discharged on the same. -Patient reports compliance to Vimpat and Keppra at home.  Levels  sent. -Neurology consult appreciated.  Currently getting continuous EEG monitoring.   Type 2 diabetes mellitus -A1c 5.6 on 03/09/2021 -Not on antidiabetic medications at home. -Fingersticks normal. Recent Labs  Lab 03/31/21 2159 04/01/21 0754 04/01/21 1148  GLUCAP 196* 87 77     Essential hypertension -Home meds include carvedilol, Imdur, hydralazin.  Amlodipine was added in last admission but it seems patient had not started that at home. -Blood pressure in 160s this morning. -Currently on Coreg, Imdur.  Start again amlodipine 5 mg daily.  Hydralazine as needed   CAD/multiple stents History of stroke with mild right-sided residual deficits -On aspirin, Plavix and statin   CKD 3B  -stable creatinine.  Baseline creatinine between 1.3 and 1.8.  Follows up with Dr. Zollie Scale at [facility]. Recent Labs    03/11/21 0451 03/12/21 0441 03/13/21 0738 03/14/21 0118 03/15/21 0227 03/16/21 0424 03/17/21 0707 03/18/21 0906 03/31/21 1329 04/01/21 0449  BUN 33* 28* 32* 33* 28* 24* 25* 22* 19 19  CREATININE 1.67* 1.48* 1.83* 1.94* 2.02* 1.86* 2.16* 1.78* 1.62* 1.56*   Hypothyroidism -Synthroid   Chronic anemia -Hemoglobin at baseline more than 10.  Slightly low today.  No active bleeding.   History of demyelinating disorder Impaired mobility -Patient follows up with neurology as an outpatient. -In last admission, PT/OT eval as well was obtained. Patient was recommended to stay for last admission but she made a choice to go home with services.  Patient is concerned about burden on her son.  May need placement.   Goals of care -  Code Status: Full Code  Goals of care   Code Status: Full Code    Mobility: Pending PT eval  Nutritional status:  There is no height or weight on file to calculate BMI.          Diet:  Diet Order             Diet heart healthy/carb modified Room service appropriate? Yes; Fluid consistency: Thin  Diet effective now                    DVT prophylaxis:  enoxaparin (LOVENOX) injection 40 mg Start: 03/31/21 1800   Antimicrobials: None Fluid: None Consultants: Neurology Family Communication: None at bedside  Status is: Inpatient  Continue in-hospital care because: Ongoing continuous EEG Level of care: Telemetry Medical   Dispo: The patient is from: Home              Anticipated d/c is to: Patient wants CIR.  Pending PT eval              Patient currently is not medically stable to d/c.   Difficult to place patient No     Infusions:    Scheduled Meds:  albuterol  2.5 mg Nebulization Q6H   amLODipine  5 mg Oral Daily   aspirin EC  81 mg Oral Daily   carvedilol  12.5 mg Oral BID   clopidogrel  75 mg Oral Daily   desvenlafaxine  50 mg Oral Daily   enoxaparin (LOVENOX) injection  40 mg Subcutaneous Q24H   gabapentin  600 mg Oral BID   insulin aspart  0-5 Units Subcutaneous QHS   insulin aspart  0-9 Units Subcutaneous TID WC   isosorbide mononitrate  120 mg Oral Daily   lacosamide  200 mg Oral BID   levETIRAcetam  1,000 mg Oral BID   levothyroxine  25 mcg Oral QAC breakfast   mirabegron ER  50 mg Oral Daily   mirtazapine  7.5 mg Oral QHS   rosuvastatin  40 mg Oral q1800   senna  1 tablet Oral BID    PRN meds: acetaminophen-codeine, ALPRAZolam, baclofen, hydrALAZINE, morphine injection, Rimegepant Sulfate   Antimicrobials: Anti-infectives (From admission, onward)    None       Objective: Vitals:   04/01/21 1200 04/01/21 1322  BP: (!) 160/79 (!) 151/70  Pulse: 65 73  Resp: 13 16  Temp:    SpO2: 98% 97%   No intake or output data in the 24 hours ending 04/01/21 1348 There were no vitals filed for this visit. Weight change:  There is no height or weight on file to calculate BMI.   Physical Exam: General exam: Pleasant, middle-aged [name].  Not in physical distress Skin: No rashes, lesions or ulcers. HEENT: Atraumatic, normocephalic, no obvious bleeding Lungs: Clear to  auscultation bilaterally CVS: Regular rate and rhythm, no murmur GI/Abd soft, nontender, nondistended, bowel sound present CNS: Alert, awake, oriented to place and person.  Slow to respond Psychiatry: Sad affect Extremities: No pedal edema, no calf tenderness  Data Review: I have personally reviewed the laboratory data and studies available.  F/u labs ordered Unresulted Labs (From admission, onward)     Start     Ordered   04/01/21 6767  Basic metabolic panel  Daily,   R      03/31/21 1747   04/01/21 0500  CBC  Daily,   R      03/31/21 1747   03/31/21 1729  Lacosamide  Add-on,   AD  03/31/21 1728   03/31/21 1332  Levetiracetam level  Once,   STAT        03/31/21 1331            Signed, Terrilee Croak, MD [facility] 04/01/2021

## 2021-04-02 LAB — CBC
HCT: 27.2 % — ABNORMAL LOW (ref 36.0–46.0)
Hemoglobin: 8.6 g/dL — ABNORMAL LOW (ref 12.0–15.0)
MCH: 27.9 pg (ref 26.0–34.0)
MCHC: 31.6 g/dL (ref 30.0–36.0)
MCV: 88.3 fL (ref 80.0–100.0)
Platelets: 129 10*3/uL — ABNORMAL LOW (ref 150–400)
RBC: 3.08 MIL/uL — ABNORMAL LOW (ref 3.87–5.11)
RDW: 15.8 % — ABNORMAL HIGH (ref 11.5–15.5)
WBC: 6.1 10*3/uL (ref 4.0–10.5)
nRBC: 0 % (ref 0.0–0.2)

## 2021-04-02 LAB — BASIC METABOLIC PANEL
Anion gap: 10 (ref 5–15)
BUN: 19 mg/dL (ref 6–20)
CO2: 24 mmol/L (ref 22–32)
Calcium: 8.5 mg/dL — ABNORMAL LOW (ref 8.9–10.3)
Chloride: 109 mmol/L (ref 98–111)
Creatinine, Ser: 1.53 mg/dL — ABNORMAL HIGH (ref 0.44–1.00)
GFR, Estimated: 41 mL/min — ABNORMAL LOW (ref >60)
Glucose, Bld: 91 mg/dL (ref 70–99)
Potassium: 4.5 mmol/L (ref 3.5–5.1)
Sodium: 143 mmol/L (ref 135–145)

## 2021-04-02 LAB — GLUCOSE, CAPILLARY
Glucose-Capillary: 84 mg/dL (ref 70–99)
Glucose-Capillary: 245 mg/dL — ABNORMAL HIGH (ref 70–99)
Glucose-Capillary: 117 mg/dL — ABNORMAL HIGH (ref 70–99)
Glucose-Capillary: 207 mg/dL — ABNORMAL HIGH (ref 70–99)

## 2021-04-02 LAB — LEVETIRACETAM LEVEL: Levetiracetam Lvl: 61.7 ug/mL — ABNORMAL HIGH (ref 10.0–40.0)

## 2021-04-02 MED ORDER — ONDANSETRON HCL 4 MG/2ML IJ SOLN
4.0000 mg | Freq: Four times a day (QID) | INTRAMUSCULAR | Status: DC | PRN
  Administered 2021-04-02: 4 mg via INTRAVENOUS
  Filled 2021-04-02: qty 2

## 2021-04-02 MED ORDER — CARVEDILOL 6.25 MG PO TABS
6.2500 mg | ORAL_TABLET | Freq: Two times a day (BID) | ORAL | Status: DC
  Administered 2021-04-02 – 2021-04-03 (×3): 6.25 mg via ORAL
  Filled 2021-04-02 (×3): qty 1

## 2021-04-02 MED ORDER — AMLODIPINE BESYLATE 10 MG PO TABS
10.0000 mg | ORAL_TABLET | Freq: Every day | ORAL | Status: DC
  Administered 2021-04-02 – 2021-04-03 (×2): 10 mg via ORAL
  Filled 2021-04-02 (×2): qty 1

## 2021-04-02 NOTE — Progress Notes (Signed)
Called to assess patient after she pushed the button on the continuous EEG.  When entering the room, the patient was laying in bed, alert and oriented and stated "I stared into space, and my body trembled."  None noted.  Neuro assessment no change.  MD notified.  No additional orders given. ?

## 2021-04-02 NOTE — TOC Initial Note (Signed)
Transition of Care (TOC) - Initial/Assessment Note  ? ? ?Patient Details  ?Name: [facility] ?MRN: [id number] ?Date of Birth: 1970/06/21 ? ?Transition of Care (TOC) CM/SW Contact:    ?Pollie Friar, RN ?Phone Number: ?04/02/2021, 2:36 PM ? ?Clinical Narrative:                 ?Patient is from home with her mother. Pt has also had HH therapies at home with [facility]. ?Pt requesting new rollator for home. CM has placed order with [employer] and will be delivered to the room.  ?Recommendations are for SNF rehab. Pt prefers to attend at [facility] in [place] has sent them the referral and reached out to see if they will look it over. She was also agreeable to having her information sent to the other facilities in the [facility] area.  ?TOC following. ? ?Expected Discharge Plan: [place] ?Barriers to Discharge: Continued Medical Work up ? ? ?Patient Goals and CMS Choice ?  ?CMS Medicare.gov Compare Post Acute Care list provided to:: Patient ?Choice offered to / list presented to : Patient ? ?Expected Discharge Plan and Services ?Expected Discharge Plan: [place] ?In-house Referral: Clinical Social Work ?Discharge Planning Services: CM Consult ?Post Acute Care Choice: [place] ?Living arrangements for the past 2 months: [place] ?                ?  ?  ?  ?  ?  ?  ?  ?  ?  ?  ? ?Prior Living Arrangements/Services ?Living arrangements for the past 2 months: [place] ?Lives with:: Parents ?Patient language and need for interpreter reviewed:: Yes ?Do you feel safe going back to the place where you live?: Yes      ?Need for Family Participation in Patient Care: Yes (Comment) ?Care giver support system in place?: Yes (comment) ?  ?Criminal Activity/Legal Involvement Pertinent to Current Situation/Hospitalization: No - Comment as needed ? ?Activities of Daily Living ?  ?  ? ?Permission Sought/Granted ?  ?  ?   ?   ?   ?    ? ?Emotional Assessment ?Appearance:: Appears stated age ?Attitude/Demeanor/Rapport: Engaged ?Affect (typically observed): Accepting ?Orientation: : Oriented to Self, Oriented to Place, Oriented to  Time, Oriented to Situation ?  ?Psych Involvement: No (comment) ? ?Admission diagnosis:  Seizure ([place]) [R56.9] ?Seizure disorder ([place]) [W09.811] ?Patient Active Problem List  ? Diagnosis Date Noted  ? Hypertensive emergency   ? Urinary tract infection without hematuria   ? Hypertension associated with diabetes ([place])   ? Malnutrition of moderate degree 03/10/2021  ? Seizure ([place])   ? Altered mental status 02/08/2021  ? Seizure disorder ([place]) 02/08/2021  ? Acute focal neurological deficit 02/07/2021  ? Chronic kidney disease, stage III (moderate) (HCC)   ? Neurogenic bladder   ? Intermittent self-catheterization of bladder   ? History of ESBL E. coli infection   ? Memory loss 01/17/2021  ? Chronic migraine w/o aura w/o status migrainosus, not intractable 01/17/2021  ? Major depressive disorder, recurrent episode, moderate ([place]) 12/08/2020  ? Family history of malignant neoplasm of gastrointestinal tract   ? Near syncope 01/25/2020  ? Pulmonary embolism ([place]) 11/07/2019  ? Type II diabetes mellitus with renal manifestations ([place]) 11/05/2019  ? Family history of breast cancer 05/23/2019  ? Family history of colon cancer 05/23/2019  ? Retinopathy 05/12/2019  ? Abnormal LFTs 04/18/2019  ? Esophageal dysphagia   ?  Gastrointestinal tract imaging abnormality   ? Malignant neoplasm of vulva, unspecified ([place]) 03/10/2019  ? Peripheral vascular disease, unspecified ([place]) 03/10/2019  ? Paroxysmal supraventricular tachycardia ([place]) 03/10/2019  ? Autonomic neuropathy 03/24/2018  ? H/O gastric bypass 03/02/2018  ? Hypertension associated with stage 3 chronic kidney disease due to type 2 diabetes mellitus ([place]) 02/20/2018  ? Insomnia 03/18/2017  ? Ischemic cardiomyopathy   ? Arthritis   ? Anxiety   ? Hx of colonic polyps   ? CAD  (coronary artery disease)   ? Hypertensive heart disease   ? Status post bariatric surgery 06/04/2015  ? Carotid stenosis 04/30/2015  ? Iron deficiency anemia 03/22/2015  ? Vitamin B12 deficiency 02/18/2015  ? Malignant melanoma ([place]) 08/25/2014  ? Chronic systolic CHF (congestive heart failure) ([place])   ? Incomplete bladder emptying 07/12/2014  ? Hypothyroidism 12/30/2013  ? Aberrant subclavian artery 11/17/2013  ? Multiple sclerosis ([place]) 11/02/2013  ? History of CVA with residual deficit 06/20/2013  ? Headache, migraine 05/29/2013  ? Hyperlipidemia   ? GERD (gastroesophageal reflux disease)   ? Demyelinating disease ([place]) 01/02/2011  ? Depression with anxiety 05/01/2008  ? Essential hypertension 05/01/2008  ? ?PCP:  [name], MD ?Pharmacy:   ?[id number] Cl[facility] - [place], [place] - [address] ?[address] ?[place] [address] ?Phone: [phone] Fax: [phone] ? ?[facility] [place] [address] ?[place] ?[place] [address] ?Phone: [phone] Fax: [phone] ? ?[facility] ?[address] ?[place] [address] ?Phone: [phone] Fax: [phone] ? ?[place] ([address] [place] ?[place] ([place])  [address] ?Phone: [phone] Fax: [phone] ? ? ? ? ?Social Determinants of Health (SDOH) Interventions ?  ? ?Readmission Risk Interventions ?Readmission Risk Prevention Plan 11/07/2019 10/18/2019 09/05/2019  ?Transportation Screening Complete Complete Complete  ?Medication Review Press photographer) Complete Complete Complete  ?PCP or Specialist appointment within 3-5 days of discharge (No Data) Complete Complete  ?[place] or Home Care Consult Complete Complete -  ?SW Recovery Care/Counseling Consult - Complete Complete  ?Palliative Care Screening Not Applicable Not Applicable Not Applicable  ?[place] Not Applicable Not Applicable Not Applicable  ?Some  recent data might be hidden  ? ? ? ?

## 2021-04-02 NOTE — Progress Notes (Signed)
LTM maint complete - no skin breakdown [facility] monitored, Event button test confirmed by [facility]. ? ?

## 2021-04-02 NOTE — Procedures (Signed)
Patient Name: [facility]  ?MRN: [id number]  ?Epilepsy Attending: Lora Havens  ?Referring Physician/Provider: Donnetta Simpers, MD ?Duration: 04/01/2021 3159 to 04/02/2021 4585 ? ?Patient history: 51 year old female with persistent encephalopathy and history of status epilepticus.  EEG to evaluate for seizure. ? ?Level of alertness: Awake, asleep ? ?AEDs during EEG study: LEV, LCM, GBP ? ?Technical aspects: This EEG study was done with scalp electrodes positioned according to the 10-20 International system of electrode placement. Electrical activity was acquired at a sampling rate of 500Hz  and reviewed with a high frequency filter of 70Hz  and a low frequency filter of 1Hz . EEG data were recorded continuously and digitally stored.  ? ?Description: The posterior dominant rhythm consists of 8 Hz activity of moderate voltage (25-35 uV) seen predominantly in posterior head regions, symmetric and reactive to eye opening and eye closing. Sleep was characterized by vertex waves, sleep spindles (12 to 14 Hz), maximal frontocentral region. EEG showed intermittent generalized 3 to 6 Hz theta-delta slowing. Hyperventilation and photic stimulation were not performed.    ? ?Patient pressed event button on 04/01/2021 at 1225, 1351  ? 1912 and 1926 for unclear reason. Concomitant EEG before, during and after the event did not show any EEG changes suggest seizure. ? ?ABNORMALITY ?- Intermittent slow, generalized ? ?IMPRESSION: ?This study is suggestive of mild diffuse encephalopathy, nonspecific etiology. No seizures or epileptiform discharges were seen throughout the recording. ? ?Patient pressed the event button on 04/01/2021 at 1225, 1351, 1912 and 1926 for unclear reasons without concomitant EEG change.  These were most likely nonepileptic events. ? ?Lora Havens  ? ?

## 2021-04-02 NOTE — Progress Notes (Signed)
LTM maint complete - no skin breakdown under:  FP1 Fp2 ?

## 2021-04-02 NOTE — Progress Notes (Signed)
PT Cancellation Note ? ?Patient Details ?Name: [facility] ?MRN: [id number] ?DOB: 1970/04/22 ? ? ?Cancelled Treatment:    Reason Eval/Treat Not Completed: Patient at procedure or test/unavailable (LTM EEG complete, but pt still connected. Will wait until disconnected for mobility evaluation). ? ?Wyona Almas, PT, DPT ?Acute Rehabilitation Services ?Pager [phone] ?Office [phone] ? ? ? ?Deno Etienne ?04/02/2021, 9:16 AM ?

## 2021-04-02 NOTE — Progress Notes (Signed)
?PROGRESS NOTE ? ?[place]  ?DOB: 01-10-1971  ?PCP: [name], MD ?TTS:[id number]  ?DOA: 03/31/2021 ? LOS: 2 days  ?Hospital Day: 3 ? ?Brief narrative: ?[name] is a 51 y.o. female with PMH significant for DM2, HTN, HLD, A-fib, CAD/multiple stents, CVA with residual right-sided weakness, gastric ulcer, history of GI bleed, iron deficiency anemia, hypothyroidism, neuromuscular disorder, recently admitted for seizure. ?Patient was brought to the ED today with complaint of several instances of absence seizure's at home. ?  ?In the ED, patient was afebrile, heart rate in 60s, blood pressure 172/99, breathing on room air ?Labs with creatinine elevated to 1.62, hemoglobin 9.8 ?CT scan of head did not show any acute intracranial abnormality. ?  ?Of note, patient was recently hospitalized from 2/4 to 2/14 for status epilepticus with absent seizures, metabolic encephalopathy, hypertensive emergency, UTI.  Insurance denied CIR, she was recommended for discharge to SNF but she declined and went home with PT, OT and aide.  She was discharged on Vimpat 200 mg twice daily, Keppra 1000 mg twice daily.  She went to live with her mom who was essentially taking care of her.  Her son is a Agricultural consultant and unable to be available for her.  She hopes that she gets approved for CIR at this time. ? ?Subjective: ?Patient was seen and examined this morning.   ?Lying on bed.  Not in distress.  Ongoing continuous EEG monitoring.   ?Pending PT eval ? ?Principal Problem: ?  Seizure ([place]) ?  ? ?Assessment and Plan: ?Recurrent absent seizures ?-Patient was brought to the ED for recurrent episodes of blank stares at home.  She was recently admitted for status epilepticus from absent seizures. She showed clinical and electrographic improvement with Keppra and Vimpat and was discharged on the same. ?-Patient reports compliance to Vimpat and Keppra at home.  Levels sent in process. ?-Neurology consult appreciated.  Currently  getting continuous EEG monitoring. ?  ?Type 2 diabetes mellitus ?-A1c 5.6 on 03/09/2021 ?-Not on antidiabetic medications at home. ?-Fingersticks normal. ?Recent Labs  ?Lab 04/01/21 ?0923 04/01/21 ?1148 04/01/21 ?1653 04/01/21 ?2120 04/02/21 ?3007  ?GLUCAP 87 77 218* 96 84  ?  ?Essential hypertension ?-Blood pressure running in 150s this morning with heart rate in 50s. ?-I reduced her Coreg to 6.25 mg twice daily and increased Norvasc to 10 mg daily.  She is also on Imdur.  Continue to monitor.  Hydralazine as needed. ?  ?CAD/multiple stents ?History of stroke with mild right-sided residual deficits ?-On aspirin, Plavix and statin ?  ?CKD 3B  ?-stable creatinine.  Baseline creatinine between 1.3 and 1.8.  Follows up with Dr. Zollie Scale at [facility].  Currently creatinine level is in baseline range ?Recent Labs  ?  03/12/21 ?0441 03/13/21 ?6226 03/14/21 ?0118 03/15/21 ?0227 03/16/21 ?0424 03/17/21 ?3335 03/18/21 ?4562 03/31/21 ?1329 04/01/21 ?5638 04/02/21 ?0132  ?BUN 28* 32* 33* 28* 24* 25* 22* 19 19 19   ?CREATININE 1.48* 1.83* 1.94* 2.02* 1.86* 2.16* 1.78* 1.62* 1.56* 1.53*  ? ?Hypothyroidism ?-Synthroid ?  ?Chronic anemia ?-Hemoglobin at baseline more than 10.  Slightly low today.  No active bleeding. ?  ?History of demyelinating disorder ?Impaired mobility ?-Patient follows up with neurology as an outpatient. ?-In last admission, PT/OT eval was obtained.. Patient was recommended to stay for last admission but she made a choice to go home with services.  Patient is concerned about burden on her son.  May need placement.  PT eval ordered today ?  ?  Goals of care ?-  Code Status: Full Code  ? ?Goals of care ?  Code Status: Full Code  ? ? ?Mobility: Pending PT eval ? ?Nutritional status:  ?There is no height or weight on file to calculate BMI.  ?  ?  ? ? ? ? ?Diet:  ?Diet Order   ? ?       ?  Diet heart healthy/carb modified Room service appropriate? Yes; Fluid consistency: Thin  Diet effective now       ?  ? ?  ?  ? ?   ? ? ?DVT prophylaxis:  ?enoxaparin (LOVENOX) injection 40 mg Start: 03/31/21 1800 ?  ?Antimicrobials: None ?Fluid: None ?Consultants: Neurology ?Family Communication: None at bedside ? ?Status is: Inpatient ? ?Continue in-hospital care because: Ongoing continuous EEG ?Level of care: Telemetry Medical  ? ?Dispo: The patient is from: Home ?             Anticipated d/c is to: Patient wants CIR.  Pending PT eval ?             Patient currently is not medically stable to d/c. ?  Difficult to place patient No ? ? ? ? ?Infusions:  ? ? ?Scheduled Meds: ? amLODipine  10 mg Oral Daily  ? aspirin EC  81 mg Oral Daily  ? carvedilol  6.25 mg Oral BID  ? clopidogrel  75 mg Oral Daily  ? desvenlafaxine  50 mg Oral Daily  ? enoxaparin (LOVENOX) injection  40 mg Subcutaneous Q24H  ? gabapentin  600 mg Oral BID  ? insulin aspart  0-5 Units Subcutaneous QHS  ? insulin aspart  0-9 Units Subcutaneous TID WC  ? isosorbide mononitrate  120 mg Oral Daily  ? lacosamide  200 mg Oral BID  ? levETIRAcetam  1,000 mg Oral BID  ? levothyroxine  25 mcg Oral QAC breakfast  ? mirabegron ER  50 mg Oral Daily  ? mirtazapine  7.5 mg Oral QHS  ? rosuvastatin  40 mg Oral q1800  ? senna  1 tablet Oral BID  ? ? ?PRN meds: ?acetaminophen-codeine, albuterol, ALPRAZolam, baclofen, hydrALAZINE, morphine injection  ? ?Antimicrobials: ?Anti-infectives (From admission, onward)  ? ? None  ? ?  ? ? ?Objective: ?Vitals:  ? 04/02/21 0421 04/02/21 0803  ?BP: (!) 144/74 (!) 153/66  ?Pulse: (!) 53 63  ?Resp:  16  ?Temp:  98.1 ?F (36.7 ?C)  ?SpO2:  100%  ? ? ?Intake/Output Summary (Last 24 hours) at 04/02/2021 1018 ?Last data filed at 04/02/2021 0402 ?Gross per 24 hour  ?Intake --  ?Output 1500 ml  ?Net -1500 ml  ? ?There were no vitals filed for this visit. ?Weight change:  ?There is no height or weight on file to calculate BMI.  ? ?Physical Exam: ?General exam: Pleasant, middle-aged [name].  Not in physical distress ?Skin: No rashes, lesions or ulcers. ?HEENT:  Atraumatic, normocephalic, no obvious bleeding ?Lungs: Clear to auscultation bilaterally ?CVS: Regular rate and rhythm, no murmur ?GI/Abd soft, nontender, nondistended, bowel sound present ?CNS: Alert, awake, oriented to place and person.  Slow to respond ?Psychiatry: Sad affect ?Extremities: No pedal edema, no calf tenderness ? ?Data Review: I have personally reviewed the laboratory data and studies available. ? ?F/u labs ordered ?Unresulted Labs (From admission, onward)  ? ?  Start     Ordered  ? 04/01/21 7353  Basic metabolic panel  Daily,   R     ? 03/31/21 1747  ? 04/01/21 0500  CBC  Daily,   R     ? 03/31/21 1747  ? 03/31/21 1729  Lacosamide  Add-on,   AD       ? 03/31/21 1728  ? 03/31/21 1332  Levetiracetam level  Once,   STAT       ? 03/31/21 1331  ? ?  ?  ? ?  ? ? ?Signed, ?Terrilee Croak, MD ?[facility] ?04/02/2021 ? ? ? ? ? ? ? ? ? ? ?  ?

## 2021-04-02 NOTE — [place] FL2 (Signed)
?[place] MEDICAID FL2 LEVEL OF CARE SCREENING TOOL  ?  ? ?IDENTIFICATION  ?Patient Name: ?[name] Birthdate: 01-Sep-1970 Sex: female Admission Date (Current Location): ?03/31/2021  ?[place] and [place] Number: ? [employer] ?  Facility and Address:  ?[facility], [place] [address], [place], [place] [address] ?     Provider Number: ?[id number]  ?Attending Physician Name and Address:  ?Terrilee Croak, MD ? Relative Name and Phone Number:  ?  ?   ?Current Level of Care: ?Hospital Recommended Level of Care: ?[place] Prior Approval Number: ?  ? ?Date Approved/Denied: ?  PASRR Number: ?[id number] A ? ?Discharge Plan: ?SNF ?  ? ?Current Diagnoses: ?Patient Active Problem List  ? Diagnosis Date Noted  ? Hypertensive emergency   ? Urinary tract infection without hematuria   ? Hypertension associated with diabetes ([place])   ? Malnutrition of moderate degree 03/10/2021  ? Seizure ([place])   ? Altered mental status 02/08/2021  ? Seizure disorder ([place]) 02/08/2021  ? Acute focal neurological deficit 02/07/2021  ? Chronic kidney disease, stage III (moderate) (HCC)   ? Neurogenic bladder   ? Intermittent self-catheterization of bladder   ? History of ESBL E. coli infection   ? Memory loss 01/17/2021  ? Chronic migraine w/o aura w/o status migrainosus, not intractable 01/17/2021  ? Major depressive disorder, recurrent episode, moderate ([place]) 12/08/2020  ? Family history of malignant neoplasm of gastrointestinal tract   ? Near syncope 01/25/2020  ? Pulmonary embolism ([place]) 11/07/2019  ? Type II diabetes mellitus with renal manifestations ([place]) 11/05/2019  ? Family history of breast cancer 05/23/2019  ? Family history of colon cancer 05/23/2019  ? Retinopathy 05/12/2019  ? Abnormal LFTs 04/18/2019  ? Esophageal dysphagia   ? Gastrointestinal tract imaging abnormality   ? Malignant neoplasm of vulva, unspecified ([place]) 03/10/2019  ? Peripheral vascular disease, unspecified ([place]) 03/10/2019  ?  Paroxysmal supraventricular tachycardia ([place]) 03/10/2019  ? Autonomic neuropathy 03/24/2018  ? H/O gastric bypass 03/02/2018  ? Hypertension associated with stage 3 chronic kidney disease due to type 2 diabetes mellitus ([place]) 02/20/2018  ? Insomnia 03/18/2017  ? Ischemic cardiomyopathy   ? Arthritis   ? Anxiety   ? Hx of colonic polyps   ? CAD (coronary artery disease)   ? Hypertensive heart disease   ? Status post bariatric surgery 06/04/2015  ? Carotid stenosis 04/30/2015  ? Iron deficiency anemia 03/22/2015  ? Vitamin B12 deficiency 02/18/2015  ? Malignant melanoma ([place]) 08/25/2014  ? Chronic systolic CHF (congestive heart failure) ([place])   ? Incomplete bladder emptying 07/12/2014  ? Hypothyroidism 12/30/2013  ? Aberrant subclavian artery 11/17/2013  ? Multiple sclerosis ([place]) 11/02/2013  ? History of CVA with residual deficit 06/20/2013  ? Headache, migraine 05/29/2013  ? Hyperlipidemia   ? GERD (gastroesophageal reflux disease)   ? Demyelinating disease ([place]) 01/02/2011  ? Depression with anxiety 05/01/2008  ? Essential hypertension 05/01/2008  ? ? ?Orientation RESPIRATION BLADDER Height & Weight   ?  ?Self, Time, Situation, Place ? Normal Incontinent Weight:   ?Height:     ?BEHAVIORAL SYMPTOMS/MOOD NEUROLOGICAL BOWEL NUTRITION STATUS  ?  Convulsions/Seizures Continent Diet (heart healthy/ carb modified with thin liquids)  ?AMBULATORY STATUS COMMUNICATION OF NEEDS Skin   ?Limited Assist Verbally  (blister and abrasion to rt wrist/ ecchymosis to rt hip) ?  ?  ?  ?    ?     ?     ? ? ?Personal Care Assistance Level  of Assistance  ?Bathing, Feeding, Dressing Bathing Assistance: Limited assistance ?Feeding assistance: Independent ?Dressing Assistance: Limited assistance ?   ? ?Functional Limitations Info  ?Sight, Hearing, Speech Sight Info: Impaired ?Hearing Info: Adequate ?Speech Info: Adequate  ? ? ?SPECIAL CARE FACTORS FREQUENCY  ?PT (By licensed PT), OT (By licensed OT)   ?  ?PT Frequency: 5x/wk ?OT Frequency:  5x/wk ?  ?  ?  ?   ? ? ?Contractures Contractures Info: Not present  ? ? ?Additional Factors Info  ?Code Status, Allergies, Psychotropic, Insulin Sliding Scale Code Status Info: Full ?Allergies Info: lipitor ?Psychotropic Info: Neurontin 600 mg BiD/ Remeron 7.5 mg at bedtime/ vimpat 200 mg BiD/ Keppra 1000 mg BID ?Insulin Sliding Scale Info: Novolog 0-9 units SQ with meals/ Novolog 0-5 units SQ at bedtime ?  ?   ? ?Current Medications (04/02/2021):  This is the current hospital active medication list ?Current Facility-Administered Medications  ?Medication Dose Route Frequency Provider Last Rate Last Admin  ? acetaminophen-codeine (TYLENOL #3) 300-30 MG per tablet 1 tablet  1 tablet Oral Q6H PRN Shalhoub, Sherryll Burger, MD   1 tablet at 04/02/21 0856  ? albuterol (PROVENTIL) (2.5 MG/3ML) 0.083% nebulizer solution 2.5 mg  2.5 mg Nebulization Q4H PRN Dahal, Binaya, MD      ? ALPRAZolam Duanne Moron) tablet 1 mg  1 mg Oral BID PRN Dahal, Marlowe Aschoff, MD      ? amLODipine (NORVASC) tablet 10 mg  10 mg Oral Daily Dahal, Binaya, MD   10 mg at 04/02/21 1057  ? aspirin EC tablet 81 mg  81 mg Oral Daily Dahal, Binaya, MD   81 mg at 04/02/21 1057  ? baclofen (LIORESAL) tablet 5 mg  5 mg Oral BID PRN Dahal, Marlowe Aschoff, MD      ? carvedilol (COREG) tablet 6.25 mg  6.25 mg Oral BID Dahal, Marlowe Aschoff, MD   6.25 mg at 04/02/21 1057  ? clopidogrel (PLAVIX) tablet 75 mg  75 mg Oral Daily Dahal, Marlowe Aschoff, MD   75 mg at 04/02/21 1057  ? desvenlafaxine (PRISTIQ) 24 hr tablet 50 mg  50 mg Oral Daily Dahal, Binaya, MD   50 mg at 04/02/21 1057  ? enoxaparin (LOVENOX) injection 40 mg  40 mg Subcutaneous Q24H Dahal, Marlowe Aschoff, MD   40 mg at 04/01/21 1831  ? gabapentin (NEURONTIN) capsule 600 mg  600 mg Oral BID Dahal, Marlowe Aschoff, MD   600 mg at 04/02/21 1057  ? hydrALAZINE (APRESOLINE) injection 10 mg  10 mg Intravenous Q6H PRN Dahal, Marlowe Aschoff, MD      ? insulin aspart (novoLOG) injection 0-5 Units  0-5 Units Subcutaneous QHS Dahal, Binaya, MD      ? insulin aspart (novoLOG)  injection 0-9 Units  0-9 Units Subcutaneous TID WC Terrilee Croak, MD   3 Units at 04/02/21 1231  ? isosorbide mononitrate (IMDUR) 24 hr tablet 120 mg  120 mg Oral Daily Dahal, Binaya, MD   120 mg at 04/02/21 1057  ? lacosamide (VIMPAT) tablet 200 mg  200 mg Oral BID Terrilee Croak, MD   200 mg at 04/02/21 1057  ? levETIRAcetam (KEPPRA) tablet 1,000 mg  1,000 mg Oral BID Terrilee Croak, MD   1,000 mg at 04/02/21 1057  ? levothyroxine (SYNTHROID) tablet 25 mcg  25 mcg Oral QAC breakfast Terrilee Croak, MD   25 mcg at 04/02/21 0824  ? mirabegron ER (MYRBETRIQ) tablet 50 mg  50 mg Oral Daily Dahal, Binaya, MD   50 mg at 04/02/21 1057  ? mirtazapine (REMERON) tablet  7.5 mg  7.5 mg Oral QHS Dahal, Marlowe Aschoff, MD   7.5 mg at 04/01/21 2146  ? morphine (PF) 2 MG/ML injection 2 mg  2 mg Intravenous Q2H PRN Dahal, Binaya, MD      ? ondansetron (ZOFRAN) injection 4 mg  4 mg Intravenous Q6H PRN Terrilee Croak, MD   4 mg at 04/02/21 1232  ? rosuvastatin (CRESTOR) tablet 40 mg  40 mg Oral q1800 Terrilee Croak, MD   40 mg at 04/01/21 1830  ? senna (SENOKOT) tablet 8.6 mg  1 tablet Oral BID Terrilee Croak, MD   8.6 mg at 04/01/21 4255  ? ? ? ?Discharge Medications: ?Please see discharge summary for a list of discharge medications. ? ?Relevant Imaging Results: ? ?Relevant Lab Results: ? ? ?Additional Information ?SSN: [id number] ? ?Pollie Friar, RN ? ? ? ? ?

## 2021-04-02 NOTE — Procedures (Signed)
Patient Name: [facility]  ?MRN: [id number]  ?Epilepsy Attending: Lora Havens  ?Referring Physician/Provider: Donnetta Simpers, MD ?Duration: 04/02/2021 0857 to 04/02/2021 1150 ?  ?Patient history: 51 year old female with persistent encephalopathy and history of status epilepticus.  EEG to evaluate for seizure. ?  ?Level of alertness: Awake, asleep ?  ?AEDs during EEG study: LEV, LCM, GBP ?  ?Technical aspects: This EEG study was done with scalp electrodes positioned according to the 10-20 International system of electrode placement. Electrical activity was acquired at a sampling rate of 500Hz  and reviewed with a high frequency filter of 70Hz  and a low frequency filter of 1Hz . EEG data were recorded continuously and digitally stored.  ?  ?Description: The posterior dominant rhythm consists of 8 Hz activity of moderate voltage (25-35 uV) seen predominantly in posterior head regions, symmetric and reactive to eye opening and eye closing. Sleep was characterized by vertex waves, sleep spindles (12 to 14 Hz), maximal frontocentral region. EEG showed intermittent generalized 3 to 6 Hz theta-delta slowing. Hyperventilation and photic stimulation were not performed.    ?  ?Patient pressed event button on 04/02/2021 at 0926 for unclear reason . Concomitant EEG before, during and after the event did not show any EEG changes suggest seizure. ?  ?ABNORMALITY ?- Intermittent slow, generalized ?  ?IMPRESSION: ?This study is suggestive of mild diffuse encephalopathy, nonspecific etiology. No seizures or epileptiform discharges were seen throughout the recording. ?  ?Patient pressed the event button on 04/02/2021 at 0926 for unclear reason without concomitant EEG change.  This was most likely a nonepileptic event. ?  ?Lora Havens  ?

## 2021-04-02 NOTE — Evaluation (Addendum)
Physical Therapy Evaluation ?Patient Details ?Name: [name] ?MRN: [id number] ?DOB: 29-Nov-1970 ?Today's Date: 04/02/2021 ? ?History of Present Illness ? 51 y.o. female, who presented to the [facility] ED as a transfer from [name] with a chief complaint of AMS. She has a pertinent past medical history of subclinical status epilepticus, CAD, DM2, CKD, GIB, CVA with left sided weakness, hypothyroidism.  ?Clinical Impression ? PTA, pt lives with her mother, has an aide 2x/wk, is a household ambulator with RW, and requires assist for ADL's. Pt has made good progress since her prior inpatient admission. Ambulating 60 feet with a walker at a min guard assist level for safety. Displays right sided residual weakness (chronic from prior stroke), impaired dynamic balance, and gait abnormalities. Presents as a high fall risk based on decreased gait speed, history of falls and decreased safety awareness. In light of deficits and decreased caregiver support, recommend SNF. ?   ? ?Recommendations for follow up therapy are one component of a multi-disciplinary discharge planning process, led by the attending physician.  Recommendations may be updated based on patient status, additional functional criteria and insurance authorization. ? ?Follow Up Recommendations Skilled nursing-short term rehab (<3 hours/day)  ? ?  ?Assistance Recommended at Discharge Frequent or constant Supervision/Assistance  ?Patient can return home with the following ? Assistance with cooking/housework;Direct supervision/assist for medications management;Direct supervision/assist for financial management;Assist for transportation;Help with stairs or ramp for entrance;A little help with walking and/or transfers;A little help with bathing/dressing/bathroom ? ?  ?Equipment Recommendations Rollator (4 wheels)  ?Recommendations for Other Services ?    ?  ?Functional Status Assessment    ? ?  ?Precautions / Restrictions Precautions ?Precautions: Fall ?Precaution  Comments: seizures ?Restrictions ?Weight Bearing Restrictions: No  ? ?  ? ?Mobility ? Bed Mobility ?Overal bed mobility: Needs Assistance ?Bed Mobility: Supine to Sit ?Rolling: Min guard ?  ?  ?  ?  ?General bed mobility comments: Pt reaching for external support, but able to ultimately complete without physical assist. Use of bed rail, which she states she has at home ?  ? ?Transfers ?Overall transfer level: Needs assistance ?Equipment used: Rolling walker (2 wheels) ?Transfers: Sit to/from Stand ?Sit to Stand: Supervision ?  ?  ?  ?  ?  ?General transfer comment: supervision for safety ?  ? ?Ambulation/Gait ?Ambulation/Gait assistance: Min guard ?Gait Distance (Feet): 60 Feet ?Assistive device: Rolling walker (2 wheels) ?Gait Pattern/deviations: Step-through pattern, Decreased stride length, Decreased dorsiflexion - right ?Gait velocity: decreased ?  ?  ?General Gait Details: Tendency for downward gaze, min cues for obstacle negotiation, decreased R heel strike ? ?Stairs ?  ?  ?  ?  ?  ? ?Wheelchair Mobility ?  ? ?Modified Rankin (Stroke Patients Only) ?  ? ?  ? ?Balance Overall balance assessment: Needs assistance ?Sitting-balance support: Feet supported ?Sitting balance-Leahy Scale: Good ?  ?  ?Standing balance support: Bilateral upper extremity supported ?Standing balance-Leahy Scale: Poor ?Standing balance comment: reliant on external support ?  ?  ?  ?  ?  ?  ?  ?  ?  ?  ?  ?   ? ? ? ?Pertinent Vitals/Pain Pain Assessment ?Pain Assessment: No/denies pain  ? ? ?Home Living Family/patient expects to be discharged to:: Private residence ?Living Arrangements: Parent (mother) ?Available Help at Discharge: Family;Available 24 hours/day ?Type of Home: House ?Home Access: Ramped entrance ?  ?  ?  ?Home Layout: One level ?Home Equipment: Rollator (4 wheels);Cane - single point;Shower seat;Wheelchair -  manual;BSC/3in1 (states Rollator "doesn't work right," and w/c doesn't fit through doorways) ?Additional Comments:  Aide 2x/wk  ?  ?Prior Function Prior Level of Function : Needs assist ?  ?  ?  ?  ?  ?  ?Mobility Comments: limited household ambulator with RW, fall a couple days ago at home. +2 assist for stairs. w/c for any appointments ?ADLs Comments: aide assisting with ADL's i.e. bathing, dressing. usually wears night gowns ?  ? ? ?Hand Dominance  ? Dominant Hand: Right ? ?  ?Extremity/Trunk Assessment  ? Upper Extremity Assessment ?Upper Extremity Assessment: RUE deficits/detail;LUE deficits/detail ?RUE Deficits / Details: Residual deficits from stroke. Full ROM, strength is globally 3-/5. Using functionally for self feeding and RW management. ?LUE Deficits / Details: Grossly 4/5 ?  ? ?Lower Extremity Assessment ?Lower Extremity Assessment: RLE deficits/detail;LLE deficits/detail ?RLE Deficits / Details: Hip flexion 2+/5, otherwise grossly 4/5 ?LLE Deficits / Details: Hip flexion 3+/5, otherwise grossly 4/5 ?  ? ?   ?Communication  ? Communication: No difficulties  ?Cognition Arousal/Alertness: Awake/alert ?Behavior During Therapy: [facility] for tasks assessed/performed ?Overall Cognitive Status: Within Functional Limits for tasks assessed ?  ?  ?  ?  ?  ?  ?  ?  ?  ?  ?  ?  ?  ?  ?  ?  ?General Comments: A&Ox4, pleasant and following all commands, WFL for mobility evaluation ?  ?  ? ?  ?General Comments   ? ?  ?Exercises    ? ?Assessment/Plan  ?  ?PT Assessment Patient needs continued PT services  ?PT Problem List Decreased strength;Decreased range of motion;Decreased activity tolerance;Decreased balance;Decreased mobility;Decreased coordination;Decreased cognition;Decreased safety awareness;Decreased knowledge of use of DME;Decreased knowledge of precautions;Impaired tone ? ?   ?  ?PT Treatment Interventions DME instruction;Gait training;Functional mobility training;Therapeutic activities;Therapeutic exercise;Balance training;Cognitive remediation;Patient/family education   ? ?PT Goals (Current goals can be found in the Care  Plan section)  ?Acute Rehab PT Goals ?Patient Stated Goal: agreeable to return home ?PT Goal Formulation: With patient ?Time For Goal Achievement: 04/16/21 ?Potential to Achieve Goals: Good ? ?  ?Frequency Min 3X/week ?  ? ? ?Co-evaluation   ?  ?  ?  ?  ? ? ?  ?AM-PAC PT "6 Clicks" Mobility  ?Outcome Measure Help needed turning from your back to your side while in a flat bed without using bedrails?: None ?Help needed moving from lying on your back to sitting on the side of a flat bed without using bedrails?: A Little ?Help needed moving to and from a bed to a chair (including a wheelchair)?: A Little ?Help needed standing up from a chair using your arms (e.g., wheelchair or bedside chair)?: A Little ?Help needed to walk in [name] room?: A Little ?Help needed climbing 3-5 steps with a railing? : A Little ?6 Click Score: 19 ? ?  ?End of Session Equipment Utilized During Treatment: Gait belt ?Activity Tolerance: Patient tolerated treatment well ?Patient left: with call bell/phone within reach;in chair;with chair alarm set ?Nurse Communication: Mobility status ?PT Visit Diagnosis: Unsteadiness on feet (R26.81);Other abnormalities of gait and mobility (R26.89);Muscle weakness (generalized) (M62.81);Other symptoms and signs involving the nervous system (R29.898);Difficulty in walking, not elsewhere classified (R26.2) ?  ? ?Time: 8295-6213 ?PT Time Calculation (min) (ACUTE ONLY): 25 min ? ? ?Charges:   PT Evaluation ?$PT Eval Low Complexity: 1 Low ?PT Treatments ?$Therapeutic Activity: 8-22 mins ?  ?   ? ? ?Wyona Almas, PT, DPT ?Acute Rehabilitation Services ?Pager [phone] ?Office [phone] ? ? ?  Deno Etienne ?04/02/2021, 2:16 PM ? ?

## 2021-04-02 NOTE — Progress Notes (Signed)
NEUROLOGY CONSULTATION PROGRESS NOTE  ? ?Date of service: April 02, 2021 ?Patient Name: [name] ?MRN:  [id number] ?DOB:  26-Nov-1970 ? ?Brief HPI  ? ?[name] is a 51 y.o. female with PMH significant for CAD, GI bleed, DM2, CKD, previous stroke with left sided weakness and progressive memory impairment, recent admission for subclinical status epilepticus who presents with episodes of starring off. Her neurologic examination is notable for no focal deficit. She is having multiple episodes per day with no clear trigger or provoking factor. Given her recent focal subclinical status epilepticus, will put her up on cEEG with hope that we can capture and characterize these events. ?  ?Would continue her home AEDs in the meantime. ?  ?Interval Hx  ? ?Had spell this AM that was her usual spell. Felt like her arms tremored and then starring off. She pushed the button after the event. No obvious EEG changes, no tremoring noted on video EEG. ? ?She had a few of these yesterday. Reports that this is the same events she has been having at home. cEEG with no changes during the event. ? ?Vitals  ? ?Vitals:  ? 04/02/21 0015 04/02/21 0402 04/02/21 0421 04/02/21 4665  ?BP: (!) 150/72 (!) 173/77 (!) 144/74 (!) 153/66  ?Pulse: (!) 57 (!) 57 (!) 53 63  ?Resp: 17 16  16   ?Temp: 97.9 ?F (36.6 ?C) 97.9 ?F (36.6 ?C)  98.1 ?F (36.7 ?C)  ?TempSrc: Oral Oral  Oral  ?SpO2: 97% 98%  100%  ?  ? ?There is no height or weight on file to calculate BMI. ? ?Physical Exam  ? ?General: Laying comfortably in bed; in no acute distress.  ?HENT: Normal oropharynx and mucosa. Normal external appearance of ears and nose.  ?Neck: Supple, no pain or tenderness  ?CV: No JVD. No peripheral edema.  ?Pulmonary: Symmetric Chest rise. Normal respiratory effort.  ?Abdomen: Soft to touch, non-tender.  ?Ext: No cyanosis, edema, or deformity  ?Skin: No rash. Normal palpation of skin.   ?Musculoskeletal: Normal digits and nails by inspection. No  clubbing.  ? ?Neurologic Examination  ?Mental status/Cognition: Alert, oriented to self, place, month and year, good attention.  ?Speech/language: Fluent, comprehension intact, object naming intact, repetition intact.  ?Cranial nerves:  ? CN II Pupils equal and reactive to light, no VF deficits   ? CN III,IV,VI EOM intact, no gaze preference or deviation, no nystagmus   ? CN V normal sensation in V1, V2, and V3 segments bilaterally   ? CN VII no asymmetry, no nasolabial fold flattening   ? CN VIII normal hearing to speech   ? CN IX & X normal palatal elevation, no uvular deviation   ? CN XI 5/5 head turn and 5/5 shoulder shrug bilaterally   ? CN XII midline tongue protrusion   ? ?Motor:  ?Muscle bulk: normal, tone normal ?Mvmt Root Nerve  Muscle Right Left Comments  ?SA C5/6 Ax Deltoid     ?EF C5/6 Mc Biceps 5 5   ?EE C6/7/8 Rad Triceps 5 5   ?WF C6/7 Med FCR     ?WE C7/8 PIN ECU     ?F Ab C8/T1 U ADM/FDI 5 5   ?HF L1/2/3 Fem Illopsoas 5 5   ?KE L2/3/4 Fem Quad     ?DF L4/5 D Peron Tib Ant     ?PF S1/2 Tibial Grc/Sol     ? ? ?Sensation: ? Light touch   ? Pin prick   ?  Temperature   ? Vibration   ?Proprioception   ? ?Coordination/Complex Motor:  ?- Finger to Nose intact BL ? ?Labs  ? ?Basic Metabolic Panel:  ?Lab Results  ?Component Value Date  ? NA 143 04/02/2021  ? K 4.5 04/02/2021  ? CO2 24 04/02/2021  ? GLUCOSE 91 04/02/2021  ? BUN 19 04/02/2021  ? CREATININE 1.53 (H) 04/02/2021  ? CALCIUM 8.5 (L) 04/02/2021  ? GFRNONAA 41 (L) 04/02/2021  ? GFRAA 29 (L) 03/13/2020  ? ?HbA1c:  ?Lab Results  ?Component Value Date  ? HGBA1C 5.6 03/09/2021  ? ?LDL:  ?Lab Results  ?Component Value Date  ? [place] 95 02/08/2021  ? ?Urine Drug Screen:  ?   ?Component Value Date/Time  ? [place] DETECTED 03/09/2021 0211  ? [place] DETECTED 03/09/2021 0211  ? COCAINSCRNUR NONE DETECTED 02/07/2021 2229  ? LABBENZ POSITIVE (A) 03/09/2021 0211  ? AMPHETMU NONE DETECTED 03/09/2021 0211  ? [place] DETECTED 03/09/2021 0211  ?  LABBARB NONE DETECTED 03/09/2021 0211  ?  ?Alcohol Level  ?   ?Component Value Date/Time  ? ETH <10 03/31/2021 1642  ? ?Lab Results  ?Component Value Date  ? LEVETIRACETA 52.2 (H) 03/15/2021  ? ?No results found for: PHENYTOIN, PHENOBARB, VALPROATE, CBMZ ? ?Imaging and Diagnostic studies  ? ?CT Head Wo Contrast ? ?No acute intracranial abnormality seen. ? ?cEEG ?This study is suggestive of mild diffuse encephalopathy, nonspecific etiology. No seizures or epileptiform discharges were seen throughout the recording. ?  ?Patient pressed the event button on 04/01/2021 at 1225, 1351, 1912 and 1926 for unclear reasons without concomitant EEG change.  These were most likely nonepileptic events. ? ? ?Impression  ? ?[name] is a 51 y.o. female with PMH significant for CAD, GI bleed, DM2, CKD, previous stroke with left sided weakness and progressive memory impairment, recent admission for subclinical status epilepticus who presents with episodes of starring off. Her neurologic examination is notable for no focal deficit. She is having multiple episodes per day with no clear trigger or provoking factor. Given her recent focal subclinical status epilepticus, we put her up on cEEG with hope that we can capture and characterize these events. ?  ?Several events captured that she reports involve tremoring and starring off. No obvious tremoring noted on video of cEEG. Could be that it is very subtle. ? ?These events did not have any EEG correlate and are likely non epileptic events. ? ?Recommendations  ?- continue Keppra 1G BID and Vimpat 200mg  BID ?- discontinue LTM. ?- Follow up with outpatient neurology team. ?- We will signoff. ?______________________________________________________________________ ? ? ?Thank you for the opportunity to take part in the care of this patient. If you have any further questions, please contact the neurology consultation attending. ? ?Signed, ? ?Donnetta Simpers ?[facility] ?Pager Number [phone] ? ?

## 2021-04-02 NOTE — Progress Notes (Signed)
LTM EEG discontinued - no skin breakdown at unhook.   

## 2021-04-03 LAB — CBC
HCT: 28.7 % — ABNORMAL LOW (ref 36.0–46.0)
Hemoglobin: 9.2 g/dL — ABNORMAL LOW (ref 12.0–15.0)
MCH: 27.7 pg (ref 26.0–34.0)
MCHC: 32.1 g/dL (ref 30.0–36.0)
MCV: 86.4 fL (ref 80.0–100.0)
Platelets: 132 10*3/uL — ABNORMAL LOW (ref 150–400)
RBC: 3.32 MIL/uL — ABNORMAL LOW (ref 3.87–5.11)
RDW: 15.2 % (ref 11.5–15.5)
WBC: 6.4 10*3/uL (ref 4.0–10.5)
nRBC: 0 % (ref 0.0–0.2)

## 2021-04-03 LAB — BASIC METABOLIC PANEL
Anion gap: 9 (ref 5–15)
BUN: 21 mg/dL — ABNORMAL HIGH (ref 6–20)
CO2: 24 mmol/L (ref 22–32)
Calcium: 8.5 mg/dL — ABNORMAL LOW (ref 8.9–10.3)
Chloride: 107 mmol/L (ref 98–111)
Creatinine, Ser: 1.63 mg/dL — ABNORMAL HIGH (ref 0.44–1.00)
GFR, Estimated: 38 mL/min — ABNORMAL LOW (ref >60)
Glucose, Bld: 58 mg/dL — ABNORMAL LOW (ref 70–99)
Potassium: 4.5 mmol/L (ref 3.5–5.1)
Sodium: 140 mmol/L (ref 135–145)

## 2021-04-03 LAB — GLUCOSE, CAPILLARY
Glucose-Capillary: 78 mg/dL (ref 70–99)
Glucose-Capillary: 102 mg/dL — ABNORMAL HIGH (ref 70–99)

## 2021-04-03 MED ORDER — BACLOFEN 5 MG PO TABS: 5.0000 mg | ORAL_TABLET | Freq: Two times a day (BID) | ORAL | Status: AC | PRN

## 2021-04-03 MED ORDER — SENNA 8.6 MG PO TABS: 1.0000 | ORAL_TABLET | Freq: Two times a day (BID) | ORAL | 0 refills | Status: DC

## 2021-04-03 MED ORDER — ACETAMINOPHEN-CODEINE #3 300-30 MG PO TABS
1.0000 | ORAL_TABLET | Freq: Four times a day (QID) | ORAL | 0 refills | Status: AC | PRN
Start: 2021-04-03 — End: 2021-04-06

## 2021-04-03 MED ORDER — ALPRAZOLAM 1 MG PO TABS: 1.0000 mg | ORAL_TABLET | Freq: Two times a day (BID) | ORAL | 0 refills | Status: AC | PRN

## 2021-04-03 MED ORDER — LACOSAMIDE 200 MG PO TABS: 200.0000 mg | ORAL_TABLET | Freq: Two times a day (BID) | ORAL | 0 refills | Status: DC

## 2021-04-03 MED ORDER — CARVEDILOL 6.25 MG PO TABS: 6.2500 mg | ORAL_TABLET | Freq: Two times a day (BID) | ORAL | Status: DC

## 2021-04-03 MED ORDER — AMLODIPINE BESYLATE 10 MG PO TABS: 10.0000 mg | ORAL_TABLET | Freq: Every day | ORAL | Status: DC

## 2021-04-03 NOTE — Care Management Important Message (Signed)
Important Message ? ?Patient Details  ?Name: [name] ?MRN: [id number] ?Date of Birth: [birth year] ? ? ?Medicare Important Message Given:  Yes ? ? ? ? ?[name] ?04/03/2021, 2:34 PM ?

## 2021-04-03 NOTE — Discharge Summary (Addendum)
Physician Discharge Summary  Ida Dacko [place] NWG:[id number] DOB: 05/01/1970 DOA: 03/31/2021  PCP: [name], MD  Admit date: 03/31/2021 Discharge date: 04/03/2021  Admitted From: Home Discharge disposition: SNF  Recommendations at discharge:  Continue seizure medicines as before Blood pressure medicines have been adjusted.  Continue to monitor blood pressure   Brief narrative: [name] is a 51 y.o. female with PMH significant for DM2, HTN, HLD, A-fib, CAD/multiple stents, CVA with residual right-sided weakness, gastric ulcer, history of GI bleed, iron deficiency anemia, hypothyroidism, neuromuscular disorder, recently admitted for seizure. Patient was brought to the ED today with complaint of several instances of absence seizure's at home.   In the ED, patient was afebrile, heart rate in 60s, blood pressure 172/99, breathing on room air Labs with creatinine elevated to 1.62, hemoglobin 9.8 CT scan of head did not show any acute intracranial abnormality.   Of note, patient was recently hospitalized from 2/4 to 2/14 for status epilepticus with absent seizures, metabolic encephalopathy, hypertensive emergency, UTI.  Insurance denied CIR, she was recommended for discharge to SNF but she declined and went home with PT, OT and aide.  She was discharged on Vimpat 200 mg twice daily, Keppra 1000 mg twice daily.  She went to live with her mom who was essentially taking care of her.  Her son is a Company secretary and unable to be available for her.  She hopes that she gets approved for CIR at this time.  Subjective: Patient was seen and examined this morning.   Lying on bed.  Not in distress.  Son at bedside Pending SNF  Principal Problem:   Seizure [facility])    Hospital course: Recurrent absent seizures -Patient was brought to the ED for recurrent episodes of blank stares at home.  She was recently admitted for status epilepticus from absent seizures. She showed clinical and  electrographic improvement with Keppra and Vimpat and was discharged on the same. -Patient reports compliance to Vimpat and Keppra at home.  -Neurology consult appreciated.   -In the hospital, she was kept on continuous EEG monitoring.  Clinically she had several events that involved tremoring and staring off.  No obvious EEG correlation with those events.  Per neurology, that likely nonepileptic events.   -Keppra level was sent on admission which resulted high at 61.7.  I discussed the result with neurologist Dr. Derry Lory this morning.  As patient is not having any side effect with that, he recommended to continue current dose.   Hyperglycemia -A1c 5.6 on 03/09/2021 -Not on antidiabetic medications at home. -Fingersticks normal. Recent Labs  Lab 04/02/21 1214 04/02/21 1630 04/02/21 2120 04/03/21 0622 04/03/21 1149  GLUCAP 245* 117* 207* 78 102*    Essential hypertension -Currently on Coreg at 6.25 mg twice daily, Norvasc 10 mg daily and Imdur at 120 mg daily.  Continue the same post discharge.   CAD/multiple stents History of stroke with mild right-sided residual deficits -On aspirin, Plavix and statin   CKD 3B  -stable creatinine.  Baseline creatinine between 1.3 and 1.8.  Follows up with Dr. Lourdes Sledge at [place].  Currently creatinine level is in baseline range Recent Labs    03/13/21 0738 03/14/21 0118 03/15/21 0227 03/16/21 0424 03/17/21 0707 03/18/21 0906 03/31/21 1329 04/01/21 0449 04/02/21 0132 04/03/21 0114  BUN 32* 33* 28* 24* 25* 22* 19 19 19  21*  CREATININE 1.83* 1.94* 2.02* 1.86* 2.16* 1.78* 1.62* 1.56* 1.53* 1.63*   Hypothyroidism -Synthroid   Chronic anemia -Hemoglobin remains  stable and close to baseline.  No active bleeding.   History of demyelinating disorder Impaired mobility -Patient follows up with neurology as an outpatient. - PT eval obtained.  SNF recommended.   Goals of care -  Code Status: Full Code   Goals of care   Code Status: Full  Code    Mobility: Pending PT eval  Nutritional status:  There is no height or weight on file to calculate BMI.         Wounds: Incision (Closed) 10/13/18 Hip Left (Active)  Date First Assessed/Time First Assessed: 10/13/18 1513   Location: Hip  Location Orientation: Left    Assessments 10/13/2018  4:00 PM 10/18/2018 10:00 AM  Dressing Type Adhesive bandage Other (Comment)  Dressing Clean;Dry;Intact Clean;Dry;Intact  Dressing Change Frequency -- PRN  Site / Wound Assessment Dressing in place / Unable to assess Dressing in place / Unable to assess  Drainage Amount None None     No Linked orders to display     Incision (Closed) 03/14/20 Rectum Other (Comment) (Active)  Date First Assessed/Time First Assessed: 03/14/20 0945   Location: Rectum  Location Orientation: Other (Comment)    Assessments 03/14/2020 10:04 AM 03/14/2020 10:20 AM  Dressing Type None None  Drainage Amount None None     No Linked orders to display    Discharge Exam:   Vitals:   04/02/21 2335 04/03/21 0350 04/03/21 0758 04/03/21 1117  BP: (!) 142/87 138/76 (!) 145/70 (!) 155/83  Pulse: 62 (!) 53 64 79  Resp: 18 19  18   Temp: 97.9 F (36.6 C) 97.6 F (36.4 C) 98.6 F (37 C) 98.3 F (36.8 C)  TempSrc: Oral Oral Oral   SpO2: 97% 100% 100% 100%    There is no height or weight on file to calculate BMI.  General exam: Pleasant, middle-aged [name].  Not in physical distress Skin: No rashes, lesions or ulcers. HEENT: Atraumatic, normocephalic, no obvious bleeding Lungs: Clear to auscultation bilaterally CVS: Regular rate and rhythm, no murmur GI/Abd soft, nontender, nondistended, bowel sound present CNS: Alert, awake, oriented to place and person.  Slow to respond Psychiatry: Sad affect Extremities: No pedal edema, no calf tenderness  Follow ups:    Follow-up Information     Fisher, Demetrios Isaacs, MD Follow up.   Specialty: Family Medicine Contact information: [address] [place]  [address] [place] [address] [phone]         Antonieta Iba, MD .   Specialty: Cardiology Contact information: [address] [place] [place] [address] [phone]                 Discharge Instructions:   Discharge Instructions     Call MD for:  difficulty breathing, headache or visual disturbances   Complete by: As directed    Call MD for:  extreme fatigue   Complete by: As directed    Call MD for:  hives   Complete by: As directed    Call MD for:  persistant dizziness or light-headedness   Complete by: As directed    Call MD for:  persistant nausea and vomiting   Complete by: As directed    Call MD for:  severe uncontrolled pain   Complete by: As directed    Call MD for:  temperature >100.4   Complete by: As directed    Diet general   Complete by: As directed    Discharge instructions   Complete by: As directed  Recommendations at discharge:   Continue seizure medicines as before  Blood pressure medicines have been adjusted.  Continue to monitor blood pressure  General discharge instructions: Follow with Primary MD [name], MD in 7 days  Please request your PCP  to go over your hospital tests, procedures, radiology results at the follow up. Please get your medicines reviewed and adjusted.  Your PCP may decide to repeat certain labs or tests as needed. Do not drive, operate heavy machinery, perform activities at heights, swimming or participation in water activities or provide baby sitting services if your were admitted for syncope or siezures until you have seen by Primary MD or a Neurologist and advised to do so again. [place] Controlled Substance Reporting System database was reviewed. Do not drive, operate heavy machinery, perform activities at heights, swim, participate in water activities or provide baby-sitting services while on medications for pain, sleep and mood until your outpatient physician has reevaluated you and  advised to do so again.  You are strongly recommended to comply with the dose, frequency and duration of prescribed medications. Activity: As tolerated with Full fall precautions use walker/cane & assistance as needed Avoid using any recreational substances like cigarette, tobacco, alcohol, or non-prescribed drug. If you experience worsening of your admission symptoms, develop shortness of breath, life threatening emergency, suicidal or homicidal thoughts you must seek medical attention immediately by calling 911 or calling your MD immediately  if symptoms less severe. You must read complete instructions/literature along with all the possible adverse reactions/side effects for all the medicines you take and that have been prescribed to you. Take any new medicine only after you have completely understood and accepted all the possible adverse reactions/side effects.  Wear Seat belts while driving. You were cared for by a hospitalist during your hospital stay. If you have any questions about your discharge medications or the care you received while you were in the hospital after you are discharged, you can call the unit and ask to speak with the hospitalist or the covering physician. Once you are discharged, your primary care physician will handle any further medical issues. Please note that NO REFILLS for any discharge medications will be authorized once you are discharged, as it is imperative that you return to your primary care physician (or establish a relationship with a primary care physician if you do not have one).   Increase activity slowly   Complete by: As directed        Discharge Medications:   Allergies as of 04/03/2021       Reactions   Lipitor [atorvastatin] Other (See Comments)   Leg pains        Medication List     STOP taking these medications    nitrofurantoin 100 MG capsule Commonly known as: Macrodantin       TAKE these medications    acetaminophen-codeine 300-30  MG tablet Commonly known as: TYLENOL #3 Take 1 tablet by mouth every 6 (six) hours as needed for up to 3 days for moderate pain or severe pain.   Aimovig 140 MG/ML Soaj Generic drug: Erenumab-aooe Inject 140 mg into the skin every 28 (twenty-eight) days.   ALPRAZolam 1 MG tablet Commonly known as: XANAX Take 1 tablet (1 mg total) by mouth 2 (two) times daily as needed for up to 3 days for anxiety.   amLODipine 10 MG tablet Commonly known as: NORVASC Take 1 tablet (10 mg total) by mouth daily. Start taking on: April 04, 2021  aspirin 81 MG EC tablet Take 1 tablet (81 mg total) by mouth daily. Swallow whole.   Baclofen 5 MG Tabs Take 5 mg by mouth 2 (two) times daily as needed (muscle spasms).   carvedilol 6.25 MG tablet Commonly known as: COREG Take 1 tablet (6.25 mg total) by mouth 2 (two) times daily. What changed:  medication strength how much to take   clopidogrel 75 MG tablet Commonly known as: PLAVIX Take 1 tablet (75 mg total) by mouth daily.   desvenlafaxine 50 MG 24 hr tablet Commonly known as: PRISTIQ Take 50 mg by mouth daily.   diclofenac Sodium 1 % Gel Commonly known as: VOLTAREN Apply 2 g topically 4 (four) times daily as needed (joint paim).   diphenoxylate-atropine 2.5-0.025 MG tablet Commonly known as: Lomotil Take 1 tablet by mouth 4 (four) times daily as needed for diarrhea or loose stools.   estradiol 0.1 MG/GM vaginal cream Commonly known as: ESTRACE Place 1 Applicatorful vaginally 3 (three) times a week. Estrogen Cream Instruction Discard applicator Apply pea sized amount to tip of finger to urethra before bed. Wash hands well after application. Use Monday, Wednesday and Friday   furosemide 20 MG tablet Commonly known as: LASIX Take 1 tablet by mouth every day as needed for swelling What changed: See the new instructions.   gabapentin 300 MG capsule Commonly known as: NEURONTIN Take 2 capsules (600 mg total) by mouth 2 (two) times daily.    glucose 4 GM chewable tablet Chew 1 tablet (4 g total) by mouth 3 (three) times daily as needed for low blood sugar. Home med.   isosorbide mononitrate 120 MG 24 hr tablet Commonly known as: IMDUR Take 1 tablet by mouth every day   lacosamide 200 MG Tabs tablet Commonly known as: VIMPAT Take 1 tablet (200 mg total) by mouth 2 (two) times daily.   levETIRAcetam 1000 MG tablet Commonly known as: KEPPRA Take 1 tablet (1,000 mg total) by mouth 2 (two) times daily.   levothyroxine 25 MCG tablet Commonly known as: SYNTHROID Take 1 tablet by mouth 30 minutes before breakfast What changed: See the new instructions.   mirabegron ER 50 MG Tb24 tablet Commonly known as: MYRBETRIQ Take 1 tablet (50 mg total) by mouth daily.   mirtazapine 7.5 MG tablet Commonly known as: REMERON Take 1 tablet (7.5 mg total) by mouth at bedtime.   nitroGLYCERIN 0.4 MG SL tablet Commonly known as: NITROSTAT Dissolve 1 tablet under tongue every , up to 3 doses, for chest pain. Max 3 doses in . What changed: See the new instructions.   Nurtec 75 MG Tbdp Generic drug: Rimegepant Sulfate Take 75 mg by mouth as needed. What changed: reasons to take this   pantoprazole 40 MG tablet Commonly known as: PROTONIX Take 1 tablet (40 mg total) by mouth 2 (two) times daily.   promethazine 25 MG tablet Commonly known as: PHENERGAN Take 1 tablet (25 mg total) by mouth every 6 (six) hours as needed for nausea or vomiting.   rosuvastatin 40 MG tablet Commonly known as: CRESTOR Take 1 tablet (40 mg total) by mouth daily at 6 PM.   senna 8.6 MG Tabs tablet Commonly known as: SENOKOT Take 1 tablet (8.6 mg total) by mouth 2 (two) times daily.   SYSTANE OP Place 1 drop into both eyes daily as needed (dry eyes).               Durable Medical Equipment  (From admission, onward)  Start     Ordered   04/02/21 1423  For home use only DME 4 wheeled rolling walker with seat  Once        Question:  Patient needs a walker to treat with the following condition  Answer:  Generalized weakness   04/02/21 1422             The results of significant diagnostics from this hospitalization (including imaging, microbiology, ancillary and laboratory) are listed below for reference.    Procedures and Diagnostic Studies:   CT Head Wo Contrast  Result Date: 03/31/2021 CLINICAL DATA:  Seizures. EXAM: CT HEAD WITHOUT CONTRAST TECHNIQUE: Contiguous axial images were obtained from the base of the skull through the vertex without intravenous contrast. RADIATION DOSE REDUCTION: This exam was performed according to the departmental dose-optimization program which includes automated exposure control, adjustment of the mA and/or kV according to patient size and/or use of iterative reconstruction technique. COMPARISON:  March 08, 2021. FINDINGS: Brain: Mild chronic ischemic white matter disease is noted. No mass effect or midline shift is noted. Ventricular size is within normal limits. There is no evidence of mass lesion, hemorrhage or acute infarction. Vascular: No hyperdense vessel or unexpected calcification. Skull: Normal. Negative for fracture or focal lesion. Sinuses/Orbits: No acute finding. Other: None. IMPRESSION: No acute intracranial abnormality seen. Electronically Signed   By: Lupita Raider M.D.   On: 03/31/2021 17:30     Labs:   Basic Metabolic Panel: Recent Labs  Lab 03/31/21 1329 04/01/21 0449 04/02/21 0132 04/03/21 0114  NA 145 144 143 140  K 4.7 5.1 4.5 4.5  CL 111 110 109 107  CO2 26 25 24 24   GLUCOSE 131* 84 91 58*  BUN 19 19 19  21*  CREATININE 1.62* 1.56* 1.53* 1.63*  CALCIUM 8.5* 8.4* 8.5* 8.5*  MG 1.8  --   --   --   PHOS 3.7  --   --   --    GFR Estimated Creatinine Clearance: 38.1 mL/min (A) (by C-G formula based on SCr of 1.63 mg/dL (H)). Liver Function Tests: Recent Labs  Lab 03/31/21 1329  AST 34  ALT 38  ALKPHOS 109  BILITOT 0.3  PROT  6.2*  ALBUMIN 3.1*   No results for input(s): LIPASE, AMYLASE in the last 168 hours. Recent Labs  Lab 03/31/21 1642  AMMONIA <10   Coagulation profile No results for input(s): INR, PROTIME in the last 168 hours.  CBC: Recent Labs  Lab 03/31/21 1329 04/01/21 0449 04/02/21 0132 04/03/21 0114  WBC 9.6 5.9 6.1 6.4  NEUTROABS 7.6  --   --   --   HGB 9.8* 8.5* 8.6* 9.2*  HCT 31.5* 27.4* 27.2* 28.7*  MCV 89.7 89.8 88.3 86.4  PLT 167 124* 129* 132*   Cardiac Enzymes: No results for input(s): CKTOTAL, CKMB, CKMBINDEX, TROPONINI in the last 168 hours. BNP: Invalid input(s): POCBNP CBG: Recent Labs  Lab 04/02/21 1214 04/02/21 1630 04/02/21 2120 04/03/21 0622 04/03/21 1149  GLUCAP 245* 117* 207* 78 102*   D-Dimer No results for input(s): DDIMER in the last 72 hours. Hgb A1c No results for input(s): HGBA1C in the last 72 hours. Lipid Profile No results for input(s): CHOL, HDL, LDLCALC, TRIG, CHOLHDL, LDLDIRECT in the last 72 hours. Thyroid function studies No results for input(s): TSH, T4TOTAL, T3FREE, THYROIDAB in the last 72 hours.  Invalid input(s): FREET3 Anemia work up No results for input(s): VITAMINB12, FOLATE, FERRITIN, TIBC, IRON, RETICCTPCT in the last 72 hours.  Microbiology Recent Results (from the past 240 hour(s))  Resp Panel by RT-PCR (Flu A&B, Covid) Nasopharyngeal Swab     Status: None   Collection Time: 03/31/21  1:03 PM   Specimen: Nasopharyngeal Swab; Nasopharyngeal(NP) swabs in vial transport medium  Result Value Ref Range Status   SARS Coronavirus 2 by RT PCR NEGATIVE NEGATIVE Final    Comment: (NOTE) SARS-CoV-2 target nucleic acids are NOT DETECTED.  The SARS-CoV-2 RNA is generally detectable in upper respiratory specimens during the acute phase of infection. The lowest concentration of SARS-CoV-2 viral copies this assay can detect is 138 copies/mL. A negative result does not preclude SARS-Cov-2 infection and should not be used as the sole  basis for treatment or other patient management decisions. A negative result may occur with  improper specimen collection/handling, submission of specimen other than nasopharyngeal swab, presence of viral mutation(s) within the areas targeted by this assay, and inadequate number of viral copies(<138 copies/mL). A negative result must be combined with clinical observations, patient history, and epidemiological information. The expected result is Negative.  Fact Sheet for Patients:  BloggerCourse.com  Fact Sheet for Healthcare Providers:  SeriousBroker.it  This test is no t yet approved or cleared by the [place] FDA and  has been authorized for detection and/or diagnosis of SARS-CoV-2 by FDA under an Emergency Use Authorization (EUA). This EUA will remain  in effect (meaning this test can be used) for the duration of the COVID-19 declaration under Section 564(b)(1) of the Act, 21 U.S.C.section 360bbb-3(b)(1), unless the authorization is terminated  or revoked sooner.       Influenza A by PCR NEGATIVE NEGATIVE Final   Influenza B by PCR NEGATIVE NEGATIVE Final    Comment: (NOTE) The Xpert Xpress SARS-CoV-2/FLU/RSV plus assay is intended as an aid in the diagnosis of influenza from Nasopharyngeal swab specimens and should not be used as a sole basis for treatment. Nasal washings and aspirates are unacceptable for Xpert Xpress SARS-CoV-2/FLU/RSV testing.  Fact Sheet for Patients: BloggerCourse.com  Fact Sheet for Healthcare Providers: SeriousBroker.it  This test is not yet approved or cleared by the [place] FDA and has been authorized for detection and/or diagnosis of SARS-CoV-2 by FDA under an Emergency Use Authorization (EUA). This EUA will remain in effect (meaning this test can be used) for the duration of the COVID-19 declaration under Section 564(b)(1) of the Act,  21 U.S.C. section 360bbb-3(b)(1), unless the authorization is terminated or revoked.  Performed at [facility], [address], [place], [place] [address]     Time coordinating discharge: 35 minutes  Signed: Mozelle Remlinger  [facility] 04/03/2021, 2:24 PM

## 2021-04-03 NOTE — TOC Transition Note (Signed)
Transition of Care (TOC) - CM/SW Discharge Note ? ? ?Patient Details  ?Name: [name] ?MRN: [id number] ?Date of Birth: January 12, 1971 ? ?Transition of Care (TOC) CM/SW Contact:  ?Pollie Friar, RN ?Phone Number: ?04/03/2021, 1:30 PM ? ? ?Clinical Narrative:    ?Patient is discharging to [facility] today. She is transporting via ambulance. Discharge packet is at the desk and bedside RN updated.  ? ?Room 602A ?Number for report: [phone] ? ? ?Final next level of care: [place] ?Barriers to Discharge: No Barriers Identified ? ? ?Patient Goals and CMS Choice ?  ?CMS Medicare.gov Compare Post Acute Care list provided to:: Patient ?Choice offered to / list presented to : Patient ? ?Discharge Placement ?PASRR number recieved: 04/02/21 ?           ?Patient chooses bed at: [facility] [place] ?Patient to be transferred to facility by: [facility] ?Name of family member notified: Son is aware per patient notification ?Patient and family notified of of transfer: 04/03/21 ? ?Discharge Plan and Services ?In-house Referral: Clinical Social Work ?Discharge Planning Services: CM Consult ?Post Acute Care Choice: [place]          ?  ?  ?  ?  ?  ?  ?  ?  ?  ?  ? ?Social Determinants of Health (SDOH) Interventions ?  ? ? ?Readmission Risk Interventions ?Readmission Risk Prevention Plan 11/07/2019 10/18/2019 09/05/2019  ?Transportation Screening Complete Complete Complete  ?Medication Review Press photographer) Complete Complete Complete  ?PCP or Specialist appointment within 3-5 days of discharge (No Data) Complete Complete  ?[place] or Home Care Consult Complete Complete -  ?SW Recovery Care/Counseling Consult - Complete Complete  ?Palliative Care Screening Not Applicable Not Applicable Not Applicable  ?Friend Not Applicable Not Applicable Not Applicable  ?Some recent data might be hidden  ? ? ? ? ? ?

## 2021-04-03 NOTE — Evaluation (Signed)
Occupational Therapy Evaluation Patient Details Name: [name] MRN: [id number] DOB: 1970/09/05 Today's Date: 04/03/2021   History of Present Illness 51 y.o. female, who presented to the [facility] ED as a transfer from [facility] ED with a chief complaint of AMS. She has a pertinent past medical history of subclinical status epilepticus, CAD, DM2, CKD, GIB, CVA with left sided weakness, hypothyroidism.   Clinical Impression   [name] [address] [name] is a 51 year old woman with above medical history who presents with generalized weakness, decreased activity tolerance, impaired balance and pain in left ankle resulting in a decline in functional abilities. She was able to transfer to side of the bed but needed min assistance to stand and pivot to recliner due to left ankle pain. She is setup and seated position for UB ADLs and needing min-max assist for LB ADLs. Patient will benefit from skilled OT services while in hospital to improve deficits and learn compensatory strategies as needed in order to return to PLOF. Patient agreeable for short term rehab at discharge in order to get stronger and improve independence.        Recommendations for follow up therapy are one component of a multi-disciplinary discharge planning process, led by the attending physician.  Recommendations may be updated based on patient status, additional functional criteria and insurance authorization.   Follow Up Recommendations  Skilled nursing-short term rehab (<3 hours/day)    Assistance Recommended at Discharge Frequent or constant Supervision/Assistance  Patient can return home with the following A little help with walking and/or transfers;A lot of help with bathing/dressing/bathroom;Assistance with cooking/housework    Functional Status Assessment  Patient has had a recent decline in their functional status and demonstrates the ability to make significant improvements in function in a reasonable and predictable amount of time.   Equipment Recommendations  None recommended by OT    Recommendations for Other Services       Precautions / Restrictions Precautions Precautions: Fall Precaution Comments: seizures, left ankle pain Restrictions Weight Bearing Restrictions: No      Mobility Bed Mobility Overal bed mobility: Needs Assistance Bed Mobility: Supine to Sit     Supine to sit: Supervision, HOB elevated          Transfers Overall transfer level: Needs assistance Equipment used: Rolling walker (2 wheels) Transfers: Sit to/from Stand Sit to Stand: Min assist Stand pivot transfers: Min assist   Step pivot transfers: Min assist     General transfer comment: MIn assist for steadying and verbal cues for technique to compensate with walker and UEs.      Balance Overall balance assessment: Needs assistance Sitting-balance support: No upper extremity supported, Feet supported Sitting balance-Leahy Scale: Good     Standing balance support: Bilateral upper extremity supported, Reliant on assistive device for balance Standing balance-Leahy Scale: Poor Standing balance comment: reliant on external support                           ADL either performed or assessed with clinical judgement   ADL Overall ADL's : Needs assistance/impaired Eating/Feeding: Independent;Sitting   Grooming: Set up;Sitting;Brushing hair;Wash/dry face Grooming Details (indicate cue type and reason): washed face and brushed hair seated in recliner. Upper Body Bathing: Set up;Sitting   Lower Body Bathing: Minimal assistance;Sit to/from stand   Upper Body Dressing : Set up;Sitting   Lower Body Dressing: Sit to/from stand;Maximal assistance Lower Body Dressing Details (indicate cue type and reason): assistance to don  socks and Depends Toilet Transfer: Minimal assistance;BSC/3in1;Rolling walker (2 wheels)   Toileting- Clothing Manipulation and Hygiene: Moderate assistance;Sit to/from stand        Functional mobility during ADLs: Minimal assistance;Rolling walker (2 wheels) General ADL Comments: Patient min assist and take steps to stand pivot. limited by left ankle pain and difficulty weight bearing - only able to take a couple of steps to recliner.     Vision Patient Visual Report: No change from baseline       Perception     Praxis      Pertinent Vitals/Pain Pain Assessment Pain Assessment: 0-10 Faces Pain Scale: Hurts even more Pain Location: L ankle Pain Descriptors / Indicators: Aching, Sore, Guarding, Grimacing Pain Intervention(s): Patient requesting pain meds-RN notified     Hand Dominance Right   Extremity/Trunk Assessment Upper Extremity Assessment Upper Extremity Assessment: RUE deficits/detail;LUE deficits/detail RUE Deficits / Details: WFL ROM, 4-/5 shoulder strength, 4/5 elbow, 4/5 wrist, 4/5 grip RUE Sensation: WNL RUE Coordination: WNL (able to perform finger to thumb and finger to nose WFL) LUE Deficits / Details: WFL ROM, 4/5 shoulder, 5/5 elbow, 4+/5 wrist, 4/5 grip LUE Sensation: WNL LUE Coordination: WNL (able to perform finger to thumb and finger to nose WFL)   Lower Extremity Assessment Lower Extremity Assessment: Defer to PT evaluation (today patient reporting pain in left ankle - decreased dorsiflexion and she reports some numbness)   Cervical / Trunk Assessment Cervical / Trunk Assessment: Kyphotic   Communication Communication Communication: No difficulties   Cognition Arousal/Alertness: Awake/alert Behavior During Therapy: WFL for tasks assessed/performed Overall Cognitive Status: Within Functional Limits for tasks assessed                                 General Comments: A&Ox4, pleasant and following all commands     General Comments       Exercises     Shoulder Instructions      Home Living Family/patient expects to be discharged to:: Private residence Living Arrangements: Parent (has been living with  her Mother who can provide 24/7 supervision.) Available Help at Discharge: Family;Available 24 hours/day Type of Home: House Home Access: Ramped entrance     Home Layout: One level     Bathroom Shower/Tub: Chief Strategy Officer: Standard Bathroom Accessibility: Yes How Accessible: Accessible via walker Home Equipment: Rollator (4 wheels);Cane - single point;Shower seat;Wheelchair - Engineer, site "doesn't work right," and w/c doesn't fit through doorways)   Additional Comments: Aide 2x/wk  Lives With: Family    Prior Functioning/Environment Prior Level of Function : Needs assist             Mobility Comments: limited household ambulator with RW, fall a couple days ago at home. +2 assist for stairs. w/c for any appointments ADLs Comments: aide assisting with ADL's i.e. bathing, dressing. usually wears night gown.        OT Problem List: Decreased strength;Decreased range of motion;Decreased activity tolerance;Impaired balance (sitting and/or standing);Impaired vision/perception;Decreased coordination;Decreased cognition;Decreased knowledge of use of DME or AE;Impaired tone;Impaired sensation;Impaired UE functional use;Pain      OT Treatment/Interventions: Self-care/ADL training;Therapeutic exercise;Neuromuscular education;DME and/or AE instruction;Therapeutic activities;Cognitive remediation/compensation;Visual/perceptual remediation/compensation;Patient/family education;Balance training    OT Goals(Current goals can be found in the care plan section) Acute Rehab OT Goals Patient Stated Goal: get stronger and improve independence OT Goal Formulation: With patient Time For Goal Achievement: 04/17/21 Potential to Achieve Goals: Good  OT Frequency: Min 2X/week    Co-evaluation              AM-PAC OT "6 Clicks" Daily Activity     Outcome Measure Help from another person eating meals?: A Little Help from another person taking care of  personal grooming?: A Little Help from another person toileting, which includes using toliet, bedpan, or urinal?: A Lot Help from another person bathing (including washing, rinsing, drying)?: A Little Help from another person to put on and taking off regular upper body clothing?: A Little Help from another person to put on and taking off regular lower body clothing?: A Lot 6 Click Score: 16   End of Session Equipment Utilized During Treatment: Gait belt;Rolling walker (2 wheels) Nurse Communication: Patient requests pain meds  Activity Tolerance: Patient tolerated treatment well Patient left: in bed;with call bell/phone within reach;with nursing/sitter in room  OT Visit Diagnosis: Unsteadiness on feet (R26.81);Other abnormalities of gait and mobility (R26.89);Muscle weakness (generalized) (M62.81);Other symptoms and signs involving the nervous system (R29.898);Other symptoms and signs involving cognitive function                Time: 5573-2202 OT Time Calculation (min): 21 min Charges:  OT General Charges $OT Visit: 1 Visit OT Evaluation $OT Eval Low Complexity: 1 Low  Eyoel Throgmorton, OTR/L Acute Care Rehab Services  Office [phone] Pager: [phone]   Kelli Churn 04/03/2021, 10:16 AM

## 2021-04-03 NOTE — Progress Notes (Signed)
Pt discharged at this time to SNF via [facility].  Has all belongings with her including clothing,cell phone and charger, glasses and dentures. ?

## 2021-04-08 ENCOUNTER — Encounter: Payer: Self-pay | Admitting: Oncology

## 2021-04-08 LAB — LACOSAMIDE: Lacosamide: 21.3 ug/mL — ABNORMAL HIGH (ref 5.0–10.0)

## 2021-04-17 ENCOUNTER — Telehealth: Payer: Self-pay

## 2021-04-17 NOTE — Telephone Encounter (Signed)
Patient was discharged from [facility] on 04/03/2021 to a SNF. I called patient. She states she is still in the SNF, but plans to be discharged tomorrow. Patient states that the provider in the SNF wants her to follow up with her PCP in 2 weeks (05/02/2021). Please advise of patient can be worked in for a hospital follow up/ transition into care visit on 05/02/2021.   ?

## 2021-04-17 NOTE — Telephone Encounter (Signed)
Copied from [place] [id number]. Topic: Appointment Scheduling - Scheduling Inquiry for Clinic ?>> Apr 17, 2021  3:44 PM Yvette Rack wrote: ?Reason for CRM: Pt scheduled for hospital fu appt with Dr. Caryn Section on 05/07/21 which is more than 2 weeks from discharge date. ?

## 2021-04-18 LAB — HM DIABETES EYE EXAM

## 2021-04-18 NOTE — Telephone Encounter (Signed)
Appt rescheduled

## 2021-04-18 NOTE — Telephone Encounter (Signed)
I am not here on the 31st. i can see at 8:20 3-28 or 1pm on 3-27 ?

## 2021-04-21 ENCOUNTER — Ambulatory Visit: Payer: Medicare Other | Admitting: Neurology

## 2021-04-24 ENCOUNTER — Telehealth: Payer: Self-pay | Admitting: Neurology

## 2021-04-24 ENCOUNTER — Ambulatory Visit (INDEPENDENT_AMBULATORY_CARE_PROVIDER_SITE_OTHER): Payer: Medicare Other | Admitting: Neurology

## 2021-04-24 ENCOUNTER — Encounter: Payer: Self-pay | Admitting: Neurology

## 2021-04-24 VITALS — BP 158/107 | HR 73

## 2021-04-24 DIAGNOSIS — G40301 Generalized idiopathic epilepsy and epileptic syndromes, not intractable, with status epilepticus: Secondary | ICD-10-CM

## 2021-04-24 DIAGNOSIS — R413 Other amnesia: Secondary | ICD-10-CM

## 2021-04-24 DIAGNOSIS — I639 Cerebral infarction, unspecified: Secondary | ICD-10-CM

## 2021-04-24 DIAGNOSIS — G43709 Chronic migraine without aura, not intractable, without status migrainosus: Secondary | ICD-10-CM

## 2021-04-24 DIAGNOSIS — R269 Unspecified abnormalities of gait and mobility: Secondary | ICD-10-CM

## 2021-04-24 MED ORDER — LACOSAMIDE 200 MG PO TABS: 200.0000 mg | ORAL_TABLET | Freq: Two times a day (BID) | ORAL | 3 refills | Status: AC

## 2021-04-24 MED ORDER — MIRTAZAPINE 7.5 MG PO TABS: 7.5000 mg | ORAL_TABLET | Freq: Every day | ORAL | 3 refills | Status: AC

## 2021-04-24 MED ORDER — LACOSAMIDE 200 MG PO TABS: 200.0000 mg | ORAL_TABLET | Freq: Two times a day (BID) | ORAL | 3 refills | Status: DC

## 2021-04-24 MED ORDER — LEVETIRACETAM 1000 MG PO TABS: 1000.0000 mg | ORAL_TABLET | Freq: Two times a day (BID) | ORAL | 3 refills | Status: AC

## 2021-04-24 NOTE — Telephone Encounter (Signed)
[employer] medicare order sent to [facility], [facility] they will reach out to the patient to schedule.  

## 2021-04-24 NOTE — Progress Notes (Signed)
? ?Chief Complaint  ?Patient presents with  ? Follow-up  ?  Room 14, with son ?States she is doing well, concerned  about keppra levels, c/o full body tremors   ? ? ? ? ?ASSESSMENT AND PLAN ? ?[name] is a 51 y.o. female   ?Partial status epilepticus in February 2023 require hospital admission, ?Worsening gait abnormality ? Now on Keppra 1000 mg twice a day, Vimpat 200 mg twice a day, overall tolerating it well, ? Patient was severely deconditioned since her prolonged multiple hospital admission, now complains of worsening gait abnormality, mild right ankle weakness, right arm fixation on rapid rotating movement, patient reported that she has some mild residual right-sided weakness from her reported history of stroke, ? MRI of brain in February 2023 showed no acute abnormality would explain her current right side difficulty ? Hyperreflexia on examination, MRI of cervical spine to rule out cervical spondylitic myelopathy ? ?Slow progressive worsening MRI brain findings, ? Normal spinal fluid testing December 2022, in specific, no evidence of oligoclonal banding, ? CADASIL testing was negative, ? Long history of juxtacortical, confluent periventricular lesions, also punctate white matter, possible advanced small vessel disease, ? ? ?DIAGNOSTIC DATA (LABS, IMA memory loss GING, TESTING) ?- I reviewed patient records, labs, notes, testing and imaging myself where available. ?Lab evaluations in November 2022: CMP, elevated creatinine 2.45, hemoglobin 11.9 ? ?Urine culture was positive for Enterococcus faeceium ? ?Cardiolipin antibody, negative IgG, IgA, positive IgM ?Prothrombin gene mutation -5 Leyden, mild elevated homocystine level 19.1, beta-2 glycol IgG, IgM IgA was within normal limit, negative lupus anticoagulation panel, protein S, protein C, Antithrombin III activity were within normal limit, normal TSH, CPK, A1c, ferritin was about 11 ? ?MEDICAL HISTORY: ? ?[name] is a 51 year old  female,, seen in request by Dr. Florene Glen, Kendra Opitz., for evaluation of abnormal MRI scan, she is accompanied by her niece at today's visit on January 17, 2021, her primary care physician is Dr.Fisher, Kirstie Peri, MD  ? ?I reviewed and summarized the referring note. PMHX. ?Chronic migraine ?Depression, anxiety ?HTN ?Hypothyrodism ?HLD ?CAD ?Bariatric surgery in 2011, lost weight 86 Lb,  ?History of GI bleeding secondary to gastric ulcer, ulceration at GI abdomen stenosis, was treated in February 2022, ?Iron deficiency -continue iron infusion at [facility], under the care of hematologist ? ?I reviewed extensive evaluation in the past, referring, and recent hospital discharge summary. ?I saw her in 2014 for persistent headache, I personally reviewed MRI of the brain in April 2014, only mild T2/FLAIR hyperintensity lesions, location are most suggestive of small vessel disease. ? ?She lost follow-up since May 2014, followed by her neurologist Dr. Manuella Ghazi. ? ?Hospital admission on November 3 to 8 2022, for acute mental status with increased slurred speech, generalized weakness, was treated for UTI, hypertensive emergency, in addition, I personally reviewed MRI of the brain without contrast on December 05, 2020, positive DWI lesion at right middle cerebellar peduncle, but extensive periventricular T2/FLAIR hyperdensity changes, mild generalized atrophy ? ?I was able to compare  her MRI scan to multiple previous images, there was progressive worsening white matter changes, even most recent 03 December 2020 compared to 2020, there is increased T2/FLAIR white matter changes, more noticeable at left parietal juxtacortical, right parietal periventricular regions.  No contrast-enhancement, T2 black holes, ? ?MRA of brain showed no large vessel disease ?Echocardiogram showed no significant abnormalities, ? ?Ultrasound of carotid artery showed no significant carotid artery disease, ? ?Hypercoagulable study prior to  hospital  discharge in November 2022 only showed mild elevated cardiolipin IgM antibody, indeterminant, rest of the laboratory evaluation showed no significant abnormalities ? ?Laboratory showed elevated creatinine 1.57, otherwise normal CMP, and elevated WBC 12, mild anemia at 10.8,  LDL 62, triglyceride 301 ?  ?She complaints of memory loss, today's MoCA examination is 21/30, also complains of moderate to severe daily headaches, despite taking aimovig 140 mg daily ? ?UPDATE April 24 2021: ?She was admitted to hospital in February 2023 for altered mental status, continue with EEG monitoring showed diffuse slowing with continuous client voltage spiking discharge indicative of widespread cerebral disturbance with epileptogenic potential, concern for subclinical seizure, ? ?She was treated with multiple antibiotic medications, including Vimpat from 200 mg twice a day, Keppra 1000 mg twice a day ? ?During hospital admission, patient was also compliant to have urinary tract infection, well controlled diabetes A1c 5.6, remained hypertensive, acute renal failure, ? ?Hospital readmission again at the end of March 31, 2021 for absence seizure, ? ?repeat continue video EEG monitoring, clinically she did have several events involving tremor and staring off, but no obvious EEG correlation with this, ? ?Upon admission, Keppra level was 61.7, lacosamide level was also mildly elevated 21.3, CBC showed anemia hemoglobin 9.2, platelet count 132, BMP showed creatinine 1.63, calcium 8.5, ammonia level within normal limit, negative alcohol, ? ? ? ?She is not back to herself since 2 hospital admissions in February 2023, complains of deconditioning, weakness, increased difficulty walking, had no recurrent seizure ? ?Patient reported that she has mild residual right-sided weakness from previous reported history of seizure-like event, now has worsening right leg weakness, recent couple days, also developed right ankle swelling, pain, also  complains of left hip pain, had a history of left hip replacement ? ?I personally reviewed MRI brain in Feb 2023. ?Persistent foci of ill-defined diffusion hyperintensity in the posterior left frontal centrum semiovale probably reflecting subacute to chronic infarcts, very small size acute abnormality would not explain all of her clinical findings, in addition confluent T2 hyperintensity in the supratentorium and pontine white matter, ?  ?Jan 22 2021, CSF was normal.  ? ?Now patient is taking Vimpat '200mg'$  bid, Keppra '1000mg'$  bid.  No recurrent seizure ? ?Right leg swollen, could nto walk,  ? ?She usse walker at home, she could use walker, get tired,  ? ? ? ? ?  ?PHYSICAL EXAM: ?  ?Vitals:  ? 04/24/21 1328  ?BP: (!) 158/107  ?Pulse: 73  ? ?Not recorded ?  ? ? ?There is no height or weight on file to calculate BMI. ? ?PHYSICAL EXAMNIATION: ? ?Gen: NAD, conversant, well nourised, well groomed                     ?Cardiovascular: Regular rate rhythm, no peripheral edema, warm, nontender. ?Eyes: Conjunctivae clear without exudates or hemorrhage ?Neck: Supple, no carotid bruits. ?Pulmonary: Clear to auscultation bilaterally  ? ?NEUROLOGICAL EXAM: ? ?MENTAL STATUS: Tired looking middle-age female ?Speech: ?   Speech is normal; fluent and spontaneous with normal comprehension.  ?Cognition: ?  ? ?  01/17/2021  ? 10:20 AM  ?Montreal Cognitive Assessment   ?Visuospatial/ Executive (0/5) 2  ?Naming (0/3) 3  ?Attention: Read list of digits (0/2) 2  ?Attention: Read list of letters (0/1) 1  ?Attention: Serial 7 subtraction starting at 100 (0/3) 0  ?Language: Repeat phrase (0/2) 1  ?Language : Fluency (0/1) 1  ?Abstraction (0/2) 2  ?Delayed Recall (0/5) 2  ?Orientation (0/6)  6  ?Total 20  ?Adjusted Score (based on education) 21  ? ? ?  ?CRANIAL NERVES: ?CN II: Visual fields are full to confrontation. Pupils are round equal and briskly reactive to light. ?CN III, IV, VI: extraocular movement are normal. No ptosis. ?CN V: Facial  sensation is intact to light touch ?CN VII: Face is symmetric with normal eye closure  ?CN VIII: Hearing is normal to causal conversation. ?CN IX, X: Phonation is normal. ?CN XI: Head turning and shoulder shrug a

## 2021-04-25 ENCOUNTER — Other Ambulatory Visit: Payer: Self-pay | Admitting: Family Medicine

## 2021-04-25 ENCOUNTER — Ambulatory Visit: Payer: Medicare Other | Admitting: Medical

## 2021-04-25 ENCOUNTER — Other Ambulatory Visit: Payer: Self-pay

## 2021-04-25 DIAGNOSIS — K219 Gastro-esophageal reflux disease without esophagitis: Secondary | ICD-10-CM

## 2021-04-25 NOTE — Telephone Encounter (Signed)
Tried to reach pt to see if she is still taking macrobid daily left message ?

## 2021-04-27 ENCOUNTER — Other Ambulatory Visit: Payer: Self-pay | Admitting: Family Medicine

## 2021-04-27 NOTE — Progress Notes (Deleted)
?  ?  ? ? ? ?Evaluation Performed:  Follow-up visit ? ?Date:  04/27/2021  ? ?ID:  [id number], [place] [birth year], MRN [id number] ? ?Patient Location:  ?[address] ?[place] [address]  ? ?Provider location:   ?[place], [employer] office ? ?PCP:  [name], MD  ?Cardiologist:  [facility] ? ?No chief complaint on file. ? ? ?History of Present Illness:   ? ?[name] is a 51 y.o. female past medical history of ?smoking,  ?strokes,  2010 ?chest pain dating back to 2005  ?CAD, Previous LAD stent ?Catheterization May 2017 with stent placed to her first diagonal, there was residual 50% distal LAD disease, 30% proximal, 40% proximal circumflex ? normal LV function,  ? s/p gastric bypass surgery, Has no stomach ?Baseline creatinine 1.5, previous ?ly taken off her diuretic ?Labile pressures ?GI bleed 12/21 into 02/2020, 10 units transfused ?Who presents for routine followup of her coronary artery disease ? ?Was self cathing at home 4x a day, "not emptying bladder", has incontinence ?Was off her prophylactic ABX,  ?Recent hospitalization for UTI, encephalopathy ?Blood pressure elevated in the hospital, restarted on amlodipine ? ?Now at home, Working with PT, BP  low ?Low today as well ?Continues to have dysuria, thinks her infection has persisted ? ?MRI brain moderate chronic small vessel disease, cerebral atrophy, small focus of acute ischemia within the right middle cerebral peduncle ?Was on Plavix as outpatient ? aspirin restarted at discharge ? ?On stool softener for constipation ?Son is EMT, helps her at home ?Presents with walker today ? ?Prior history of UTIs, ? enterococcus, ecoli ?Most recently Klebsiella pneumonia ? ?Takes Lasix very sparingly ? ?Lab work reviewed ?CR 1.8 ? ?EKG personally reviewed by myself on todays visit ?Normal sinus rhythm with rate 60 bpm no significant ST or T wave changes ? ?Echo 04/23/2020: normal EF ? ?Other past medical history  reviewed ?Hospital records reviewed, 12/21 to 02/2020 ? balloon-assisted enteroscopy for management of an UGI bleed. Localization with nuclear imaging and CT scan suggested the source in the bypassed stomach or proximal duodenum.  ? ?hospitalized at [facility] with GI bleed, anemia and malnutrition. She was treated with 4 units of packed red blood cells and short-term enteral nutrition ? ? ?Cath 12/2019 ?1.  Patent stents in the mid LAD and first diagonal with mild to moderate in-stent restenosis, stable moderate mid RCA stenosis, stable diffuse disease in the superior branch of the first diagonal, moderate mid LAD stenosis at the bifurcation of second diagonal which has no significant ostial disease.  There is severe progression of stenosis in the mid to distal LAD.  In addition, there is moderately severe disease in the apical LAD. ?2.  Normal left ventricular end-diastolic pressure. ?3.  Successful angioplasty and drug-eluting stent placement to the mid to distal LAD ? ?Echocardiogram October 2021 ?Ejection fraction 60% ? ?Prior hospitalizations ? hypoglycemia 10/2019 ? ?Cath 04/2019  ?Non-stenotic 1st Diag lesion was previously treated. ?Ost 1st Diag lesion is 40% stenosed. ?Mid LAD-2 lesion is 5% stenosed. ?Ost 2nd Diag to 2nd Diag lesion is 95% stenosed. ?Mid LAD-1 lesion is 30% stenosed. ?Dist LAD lesion is 80% stenosed. ?Prox Cx lesion is 40% stenosed. ?Mid RCA lesion is 30% stenosed. ? ? ?Past Medical History:  ?Diagnosis Date  ? Acute colitis 01/27/2017  ? Acute pyelonephritis   ? Acute upper GI bleed 01/25/2020  ? Anemia   ? iron deficiency anemia  ? Aortic  arch aneurysm   ? BRCA negative 2014  ? CAD (coronary artery disease)   ? a. 08/2003 Cath: LAD 30-40-med Rx; b. 11/2014 PCI: LAD 650m(3.25x23 Xience Alpine DES); c. 06/2015 PCI: D1 (2.25x12 Resolute Integrity DES); d. 06/2017 PCI: Patent mLAD stent, D2 95 (PTCA); e. 09/2017 PCI: D2 99ost (CBA); d. 12/2017 Cath: LM nl, LAD 345m80d (small), D1  40ost, D2 95ost, LCX 40p, RCA 40ost/p->Med rx for D2 given restenosis.  ? Closed nondisplaced intertrochanteric fracture of left femur (HCTaylor Creek9/10/2018  ? Colitis 06/03/2015  ? Colon polyp   ? [facility] vascular [facility]) ([facility]  ? Left side weakness.   ? Degenerative tear of glenoid labrum of right shoulder 03/15/2017  ? Diabetes mellitus without complication (HCPowell  ? Family history of breast cancer   ? BRCA neg 2014  ? Femur fracture, left (HCMahomet9/10/2018  ? Gastric ulcer 04/27/2011  ? Helicobacter pylori infection 11/23/2014  ? History of echocardiogram   ? a. 03/2017 Echo: EF 60-65%, no rwma; b. 02/2018 Echo: EF 60-65%, no rwma. Nl RV fxn. No cardiac source of emboli (admitted w/ stroke).  ? Hypertension   ? Iron deficiency anemia secondary to blood loss (chronic)   ? Malignant melanoma of skin of scalp (HCFlandreau  ? MI, acute, non ST segment elevation (HCWoodfin  ? Neuromuscular disorder (HCOakdale  ? S/P drug eluting coronary stent placement 06/04/2015  ? Sepsis (HCOilton2/14/2019  ? Sepsis secondary to UTI (HCBixby3/22/2022  ? [facility] ([facility]  ? a. 02/2018 MRI: 50m34mate acute/early subacute L medial frontal lobe inarct; b. 02/2018 MRA No large vessel occlusion or aneurysm. Mod to sev L P2 stenosis. thready L vertebral artery, diffusely dzs'd; c. 02/2018 Carotid U/S: <50% bilat ICA dzs.  ? Unspecified atrial fibrillation (HCCTarkio/22/2022  ? ?Past Surgical History:  ?Procedure Laterality Date  ? APPENDECTOMY    ? BALLOON ENTEROSCOPY  02/06/2020  ? DUMDolores BIOPSY N/A 03/14/2020  ? Procedure: BIOPSY;  Surgeon: WohLucilla LameD;  Location: [facility]Service: Endoscopy;  Laterality: N/A;  ? CARDIAC CATHETERIZATION N/A 11/09/2014  ? Procedure: Coronary Angiography;  Surgeon: TimMinna MerrittsD;  Location: [facility] LAB;  Service: Cardiovascular;  Laterality: N/A;  ? CARDIAC CATHETERIZATION N/A 11/12/2014  ? Procedure: Coronary Stent Intervention;  Surgeon: AleIsaias CowmanD;  Location: ARMShirley LAB;  Service:  Cardiovascular;  Laterality: N/A;  ? CARDIAC CATHETERIZATION N/A 04/18/2015  ? Procedure: Left Heart Cath and Coronary Angiography;  Surgeon: TimMinna MerrittsD;  Location: AR[facility] LAB;  Service: Cardiovascular;  Laterality: N/A;  ? CARDIAC CATHETERIZATION Left 06/04/2015  ? Procedure: Left Heart Cath and Coronary Angiography;  Surgeon: MuhWellington HampshireD;  Location: [facility] LAB;  Service: Cardiovascular;  Laterality: Left;  ? CARDIAC CATHETERIZATION N/A 06/04/2015  ? Procedure: Coronary Stent Intervention;  Surgeon: MuhWellington HampshireD;  Location: [facility] LAB;  Service: Cardiovascular;  Laterality: N/A;  ? CESAREAN SECTION  2001  ? CHOLECYSTECTOMY N/A 11/18/2016  ? Procedure: LAPAROSCOPIC CHOLECYSTECTOMY WITH INTRAOPERATIVE CHOLANGIOGRAM;  Surgeon: SanChristene LyeD;  [facility];  Service: General;  Laterality: N/A;  ? COLONOSCOPY WITH PROPOFOL N/A 04/27/2016  ? Procedure: COLONOSCOPY WITH PROPOFOL;  Surgeon: DarLucilla LameD;  Location: MEBBell BuckleService: Endoscopy;  Laterality: N/A;  ? COLONOSCOPY WITH PROPOFOL N/A 01/12/2018  ? Procedure: COLONOSCOPY WITH PROPOFOL;  Surgeon: TolAlice ReicherteoBenay PikeD;  [facility];  Service: Endoscopy;  Laterality: N/A;  ? COLONOSCOPY WITH PROPOFOL N/A 03/14/2020  ? Procedure: COLONOSCOPY WITH PROPOFOL;  Surgeon: [name], MD;  Location: [place];  Service: Endoscopy;  Laterality: N/A;  Needs to be scheduled after 7:30 due to driver issues  ? CORONARY ANGIOPLASTY    ? CORONARY BALLOON ANGIOPLASTY N/A 06/29/2017  ? Procedure: CORONARY BALLOON ANGIOPLASTY;  Surgeon: Wellington Hampshire, MD;  Location: [place] CV LAB;  Service: Cardiovascular;  Laterality: N/A;  ? CORONARY BALLOON ANGIOPLASTY N/A 09/20/2017  ? Procedure: CORONARY BALLOON ANGIOPLASTY;  Surgeon: Wellington Hampshire, MD;  Location: [place] CV LAB;  Service: Cardiovascular;  Laterality: N/A;  ? CORONARY STENT INTERVENTION N/A 12/13/2019   ? Procedure: CORONARY STENT INTERVENTION;  Surgeon: Wellington Hampshire, MD;  Location: [place] CV LAB;  Service: Cardiovascular;  Laterality: N/A;  ? DILATION AND CURETTAGE OF UTERUS    ? ESOPHAGOGASTRODUO

## 2021-04-28 ENCOUNTER — Encounter: Payer: Self-pay | Admitting: Neurology

## 2021-04-28 ENCOUNTER — Ambulatory Visit: Payer: Medicare Other | Admitting: Cardiovascular Disease

## 2021-04-28 ENCOUNTER — Encounter: Payer: Self-pay | Admitting: Cardiovascular Disease

## 2021-04-28 DIAGNOSIS — E782 Mixed hyperlipidemia: Secondary | ICD-10-CM

## 2021-04-28 DIAGNOSIS — I255 Ischemic cardiomyopathy: Secondary | ICD-10-CM

## 2021-04-28 DIAGNOSIS — I25118 Atherosclerotic heart disease of native coronary artery with other forms of angina pectoris: Secondary | ICD-10-CM

## 2021-04-28 DIAGNOSIS — I951 Orthostatic hypotension: Secondary | ICD-10-CM

## 2021-04-28 DIAGNOSIS — I5022 Chronic systolic (congestive) heart failure: Secondary | ICD-10-CM

## 2021-04-28 DIAGNOSIS — I10 Essential (primary) hypertension: Secondary | ICD-10-CM

## 2021-04-29 ENCOUNTER — Other Ambulatory Visit: Payer: Self-pay

## 2021-04-29 ENCOUNTER — Encounter: Payer: Self-pay | Admitting: Family Medicine

## 2021-04-29 ENCOUNTER — Encounter: Payer: Self-pay | Admitting: Cardiovascular Disease

## 2021-04-29 ENCOUNTER — Other Ambulatory Visit: Payer: Self-pay | Admitting: Family Medicine

## 2021-04-29 ENCOUNTER — Ambulatory Visit (INDEPENDENT_AMBULATORY_CARE_PROVIDER_SITE_OTHER): Payer: Medicare Other | Admitting: Family Medicine

## 2021-04-29 VITALS — BP 134/76 | HR 71 | Temp 98.4°F | Resp 12 | Wt 159.0 lb

## 2021-04-29 DIAGNOSIS — R5383 Other fatigue: Secondary | ICD-10-CM | POA: Diagnosis not present

## 2021-04-29 DIAGNOSIS — G40909 Epilepsy, unspecified, not intractable, without status epilepticus: Secondary | ICD-10-CM | POA: Diagnosis not present

## 2021-04-29 DIAGNOSIS — E039 Hypothyroidism, unspecified: Secondary | ICD-10-CM

## 2021-04-29 DIAGNOSIS — I119 Hypertensive heart disease without heart failure: Secondary | ICD-10-CM | POA: Diagnosis not present

## 2021-04-29 NOTE — Progress Notes (Signed)
?  ? ?I,Roshena L Chambers,acting as a scribe for [name], MD.,have documented all relevant documentation on the behalf of [name], MD,as directed by  [name], MD while in the presence of [name], MD.  ? ?Established patient visit ? ? ?Patient: [name]   DOB: [birth year]   51 y.o. Female  MRN: [id number] ?Visit Date: 04/29/2021 ? ?Today's healthcare provider: [name], MD  ? ?Chief Complaint  ?Patient presents with  ? Hospitalization Follow-up  ? Transitions Of Care  ? ?Subjective  ?  ?HPI  ?Follow up Hospitalization ? ?Patient was admitted to [facility] on 03/31/2021 and discharged on 04/03/2021 to [facility]. Patient was discharged from SNF ON 04/18/2021.  She had follow up with neurology on 04/24/2021 and additional follow up scheduled in June ?She was treated for seizure disorder with Keppra and Vimpat ?Has had no seizure activity since hospital discharge.  ?She reports good compliance with treatment. ?She reports this condition is improved. Patient still feels fatigued, but has not had another seizure.  ? ?----------------------------------------------------------------------------------------- ? ?Medications: ?Outpatient Medications Prior to Visit  ?Medication Sig  ? AIMOVIG 140 MG/ML SOAJ Inject 140 mg into the skin every 28 (twenty-eight) days.  ? ALPRAZolam (XANAX) 1 MG tablet Take 1 tablet by mouth twice daily  ? amLODipine (NORVASC) 10 MG tablet Take 1 tablet (10 mg total) by mouth daily.  ? aspirin EC 81 MG EC tablet Take 1 tablet (81 mg total) by mouth daily. Swallow whole.  ? Baclofen 5 MG TABS Take 5 mg by mouth 2 (two) times daily as needed (muscle spasms).  ? carvedilol (COREG) 6.25 MG tablet Take 1 tablet (6.25 mg total) by mouth 2 (two) times daily.  ? clopidogrel (PLAVIX) 75 MG tablet Take 1 tablet (75 mg total) by mouth daily.  ? desvenlafaxine (PRISTIQ) 50 MG 24 hr tablet Take 50 mg by mouth daily.  ? diclofenac Sodium (VOLTAREN) 1 % GEL Apply 2 g  topically 4 (four) times daily as needed (joint paim).  ? diphenoxylate-atropine (LOMOTIL) 2.5-0.025 MG tablet Take 1 tablet by mouth 4 (four) times daily as needed for diarrhea or loose stools.  ? estradiol (ESTRACE) 0.1 MG/GM vaginal cream Place 1 Applicatorful vaginally 3 (three) times a week. Estrogen Cream Instruction Discard applicator Apply pea sized amount to tip of finger to urethra before bed. Wash hands well after application. Use Monday, Wednesday and Friday  ? furosemide (LASIX) 20 MG tablet Take 1 tablet by mouth every day as needed for swelling (Patient taking differently: Take 20 mg by mouth daily as needed (swelling).)  ? gabapentin (NEURONTIN) 300 MG capsule Take 2 capsules (600 mg total) by mouth 2 (two) times daily.  ? glucose 4 GM chewable tablet Chew 1 tablet (4 g total) by mouth 3 (three) times daily as needed for low blood sugar. Home med.  ? isosorbide mononitrate (IMDUR) 120 MG 24 hr tablet Take 1 tablet by mouth every day (Patient taking differently: Take 120 mg by mouth daily.)  ? lacosamide (VIMPAT) 200 MG TABS tablet Take 1 tablet (200 mg total) by mouth 2 (two) times daily.  ? levETIRAcetam (KEPPRA) 1000 MG tablet Take 1 tablet (1,000 mg total) by mouth 2 (two) times daily.  ? levothyroxine (SYNTHROID) 25 MCG tablet Take 1 tablet by mouth 30 minutes before breakfast (Patient taking differently: Take 25 mcg by mouth daily before breakfast.)  ? mirabegron ER (MYRBETRIQ) 50 MG TB24 tablet Take 1 tablet (50 mg total) by mouth  daily.  ? mirtazapine (REMERON) 7.5 MG tablet Take 1 tablet (7.5 mg total) by mouth at bedtime.  ? nitroGLYCERIN (NITROSTAT) 0.4 MG SL tablet Dissolve 1 tablet under tongue every 12mns, up to 3 doses, for chest pain. Max 3 doses in 368ms. (Patient taking differently: Place 0.4 mg under the tongue every 5 (five) minutes x 3 doses as needed for chest pain.)  ? pantoprazole (PROTONIX) 40 MG tablet Take 1 tablet by mouth twice daily  ? Polyethyl Glycol-Propyl Glycol  (SYSTANE OP) Place 1 drop into both eyes daily as needed (dry eyes).  ? promethazine (PHENERGAN) 25 MG tablet Take 1 tablet (25 mg total) by mouth every 6 (six) hours as needed for nausea or vomiting.  ? Rimegepant Sulfate (NURTEC) 75 MG TBDP Take 75 mg by mouth as needed. (Patient taking differently: Take 75 mg by mouth as needed (migraine).)  ? rosuvastatin (CRESTOR) 40 MG tablet Take 1 tablet (40 mg total) by mouth daily at 6 PM.  ? senna (SENOKOT) 8.6 MG TABS tablet Take 1 tablet (8.6 mg total) by mouth 2 (two) times daily. (Patient not taking: Reported on 04/29/2021)  ? ?No facility-administered medications prior to visit.  ? ? ?Review of Systems  ?Constitutional:  Positive for fatigue. Negative for appetite change, chills and fever.  ?Eyes:  Positive for redness (right eye).  ?Respiratory:  Negative for chest tightness and shortness of breath.   ?Cardiovascular:  Negative for chest pain and palpitations.  ?Gastrointestinal:  Negative for abdominal pain, nausea and vomiting.  ?Neurological:  Negative for dizziness and weakness.  ? ? ?  Objective  ?  ?BP 134/76 (BP Location: Right Arm, Patient Position: Sitting, Cuff Size: Normal)   Pulse 71   Temp 98.4 ?F (36.9 ?C) (Oral)   Resp 12   Wt 159 lb (72.1 kg)   SpO2 100% Comment: room air  BMI 28.17 kg/m?  ? ? ?Physical Exam  ? ?General: Appearance:     ?Well developed, well nourished female in no acute distress  ?Eyes:    PERRL, conjunctiva/corneas clear, EOM's intact       ?Lungs:     Clear to auscultation bilaterally, respirations unlabored  ?Heart:    Normal heart rate. Normal rhythm. No murmurs, rubs, or gallops.    ?MS:   All extremities are intact.    ?Neurologic:   Awake, alert, oriented x 3. No apparent focal neurological defect.   ?   ?  ? Assessment & Plan  ?  ? ?1. Seizure disorder  ?(HCC) ?Well controlled on current anticonvulsants. Follow up Dr. YaKrista Bluen June as scheduled.  ? ?2. Hypertensive heart disease without heart failure ? ?- CBC ?-  Comprehensive metabolic panel ? ?3. Hypothyroidism, unspecified type ? ?- TSH ?- T4, free ? ?4. Other fatigue ?Multifactorial. Check labs as above.   ?   ? ?The entirety of the information documented in the History of Present Illness, Review of Systems and Physical Exam were personally obtained by me. Portions of this information were initially documented by the CMA and reviewed by me for thoroughness and accuracy.   ? ? ?DoLelon HuhMD  ?[facility] [phone]) ?[phone]) ? ?[facility]  ?

## 2021-04-30 ENCOUNTER — Telehealth: Payer: Self-pay

## 2021-04-30 NOTE — Telephone Encounter (Signed)
That's fine

## 2021-04-30 NOTE — Telephone Encounter (Signed)
Copied from [place] [id number]. Topic: Quick Communication - Home Health Verbal Orders ?>> Apr 30, 2021  3:18 PM Pawlus, Brayton Layman A wrote: ?Caller/Agency: [facility] ?Callback Number: [phone] ?Requesting: PT ?Frequency: 1x1,2x2,1x4 ?

## 2021-05-01 ENCOUNTER — Telehealth: Payer: Self-pay | Admitting: Family Medicine

## 2021-05-01 NOTE — Telephone Encounter (Signed)
Please advise 

## 2021-05-01 NOTE — Telephone Encounter (Signed)
That's fine

## 2021-05-01 NOTE — Telephone Encounter (Signed)
Verbal orders given  

## 2021-05-01 NOTE — Telephone Encounter (Signed)
Copied from [place] [phone]. Topic: Quick Communication - Home Health Verbal Orders ?>> May 01, 2021  2:35 PM Yvette Rack wrote: ?Caller/Agency: [place]ta [place] with [facility] ?Callback Number: [phone] ?Requesting OT/PT/Skilled Nursing/Social Work/Speech Therapy: OT ?Frequency: 1 time a week for 6 weeks ?

## 2021-05-02 NOTE — Telephone Encounter (Signed)
Left detailed message on Stephanie's vm, giving verbal okay.  ?

## 2021-05-03 ENCOUNTER — Other Ambulatory Visit: Payer: Self-pay | Admitting: Family Medicine

## 2021-05-04 ENCOUNTER — Encounter: Payer: Self-pay | Admitting: Neurology

## 2021-05-05 ENCOUNTER — Emergency Department
Admission: EM | Admit: 2021-05-05 | Discharge: 2021-05-05 | Disposition: A | Discharge status: Home / Self Care | Payer: Medicare Other | Source: Home / Self Care | Attending: Emergency Medicine | Admitting: Emergency Medicine

## 2021-05-05 ENCOUNTER — Emergency Department: Payer: Medicare Other

## 2021-05-05 ENCOUNTER — Ambulatory Visit: Payer: Self-pay | Admitting: *Deleted

## 2021-05-05 ENCOUNTER — Other Ambulatory Visit: Payer: Self-pay

## 2021-05-05 DIAGNOSIS — E039 Hypothyroidism, unspecified: Secondary | ICD-10-CM | POA: Insufficient documentation

## 2021-05-05 DIAGNOSIS — S20229A Contusion of unspecified back wall of thorax, initial encounter: Secondary | ICD-10-CM | POA: Insufficient documentation

## 2021-05-05 DIAGNOSIS — I214 Non-ST elevation (NSTEMI) myocardial infarction: Secondary | ICD-10-CM | POA: Diagnosis not present

## 2021-05-05 DIAGNOSIS — W050XXA Fall from non-moving wheelchair, initial encounter: Secondary | ICD-10-CM | POA: Insufficient documentation

## 2021-05-05 DIAGNOSIS — S300XXA Contusion of lower back and pelvis, initial encounter: Secondary | ICD-10-CM | POA: Insufficient documentation

## 2021-05-05 DIAGNOSIS — R109 Unspecified abdominal pain: Secondary | ICD-10-CM | POA: Insufficient documentation

## 2021-05-05 DIAGNOSIS — H538 Other visual disturbances: Secondary | ICD-10-CM | POA: Insufficient documentation

## 2021-05-05 DIAGNOSIS — S301XXA Contusion of abdominal wall, initial encounter: Secondary | ICD-10-CM | POA: Insufficient documentation

## 2021-05-05 DIAGNOSIS — Z85820 Personal history of malignant melanoma of skin: Secondary | ICD-10-CM | POA: Insufficient documentation

## 2021-05-05 DIAGNOSIS — I5022 Chronic systolic (congestive) heart failure: Secondary | ICD-10-CM | POA: Insufficient documentation

## 2021-05-05 DIAGNOSIS — W19XXXA Unspecified fall, initial encounter: Secondary | ICD-10-CM

## 2021-05-05 DIAGNOSIS — N183 Chronic kidney disease, stage 3 unspecified: Secondary | ICD-10-CM | POA: Insufficient documentation

## 2021-05-05 DIAGNOSIS — R079 Chest pain, unspecified: Secondary | ICD-10-CM | POA: Diagnosis not present

## 2021-05-05 DIAGNOSIS — I2510 Atherosclerotic heart disease of native coronary artery without angina pectoris: Secondary | ICD-10-CM | POA: Insufficient documentation

## 2021-05-05 DIAGNOSIS — E1122 Type 2 diabetes mellitus with diabetic chronic kidney disease: Secondary | ICD-10-CM | POA: Insufficient documentation

## 2021-05-05 DIAGNOSIS — I130 Hypertensive heart and chronic kidney disease with heart failure and stage 1 through stage 4 chronic kidney disease, or unspecified chronic kidney disease: Secondary | ICD-10-CM | POA: Insufficient documentation

## 2021-05-05 DIAGNOSIS — M549 Dorsalgia, unspecified: Secondary | ICD-10-CM | POA: Insufficient documentation

## 2021-05-05 DIAGNOSIS — H5711 Ocular pain, right eye: Secondary | ICD-10-CM

## 2021-05-05 LAB — COMPREHENSIVE METABOLIC PANEL
ALT: 34 IU/L — ABNORMAL HIGH (ref 0–32)
AST: 37 IU/L (ref 0–40)
Albumin/Globulin Ratio: 1.5 (ref 1.2–2.2)
Albumin: 3.7 g/dL — ABNORMAL LOW (ref 3.8–4.9)
Alkaline Phosphatase: 165 IU/L — ABNORMAL HIGH (ref 44–121)
BUN/Creatinine Ratio: 22 (ref 9–23)
BUN: 35 mg/dL — ABNORMAL HIGH (ref 6–24)
Bilirubin Total: <0.2 mg/dL (ref 0.0–1.2)
CO2: 22 mmol/L (ref 20–29)
Calcium: 8.6 mg/dL — ABNORMAL LOW (ref 8.7–10.2)
Chloride: 106 mmol/L (ref 96–106)
Creatinine, Ser: 1.58 mg/dL — ABNORMAL HIGH (ref 0.57–1.00)
Globulin, Total: 2.4 g/dL (ref 1.5–4.5)
Glucose: 79 mg/dL (ref 70–99)
Potassium: 4.5 mmol/L (ref 3.5–5.2)
Sodium: 145 mmol/L — ABNORMAL HIGH (ref 134–144)
Total Protein: 6.1 g/dL (ref 6.0–8.5)
eGFR: 39 mL/min/{1.73_m2} — ABNORMAL LOW (ref >59)

## 2021-05-05 LAB — CBC
Hematocrit: 34.2 % (ref 34.0–46.6)
Hemoglobin: 10.9 g/dL — ABNORMAL LOW (ref 11.1–15.9)
MCH: 26.8 pg (ref 26.6–33.0)
MCHC: 31.9 g/dL (ref 31.5–35.7)
MCV: 84 fL (ref 79–97)
Platelets: 171 10*3/uL (ref 150–450)
RBC: 4.07 x10E6/uL (ref 3.77–5.28)
RDW: 13.5 % (ref 11.7–15.4)
WBC: 6.1 10*3/uL (ref 3.4–10.8)

## 2021-05-05 LAB — T4, FREE: Free T4: 0.96 ng/dL (ref 0.82–1.77)

## 2021-05-05 LAB — TSH: TSH: 2.01 u[IU]/mL (ref 0.450–4.500)

## 2021-05-05 MED ORDER — FLUORESCEIN SODIUM 1 MG OP STRP
1.0000 | ORAL_STRIP | Freq: Once | OPHTHALMIC | Status: AC
  Administered 2021-05-05: 1 via OPHTHALMIC
  Filled 2021-05-05: qty 1

## 2021-05-05 MED ORDER — TETRACAINE HCL 0.5 % OP SOLN
2.0000 [drp] | Freq: Once | OPHTHALMIC | Status: AC
  Administered 2021-05-05: 2 [drp] via OPHTHALMIC
  Filled 2021-05-05: qty 4

## 2021-05-05 NOTE — ED Provider Notes (Signed)
? ?[facility] ?Provider Note ? ? ? Event Date/Time  ? First MD Initiated Contact with Patient 05/05/21 1238   ?  (approximate) ? ? ?History  ? ?Fall ? ? ?HPI ? ?[name] is a 51 y.o. female with past medical history of seizure disorder and recent hospitalization for altered mental status and progressive weakness, following with neurology, hypothyroidism, hypertension who presents due to several falls as well as eye pain.  Patient notes that yesterday she had 3 falls.  Currently she is wheelchair-bound and this is her baseline.  On 2 instances she was trying to get something off the ground and then let the dog in and this caused her to fall out of her wheelchair onto the ground hitting her head.  Did not lose consciousness.  The other episode occurred while she was standing up walking to turn the light on.  Currently she does not walk unassisted.  Her son who lives with her works during the day.  Patient denies any change in her overall neurologic status denies any new seizures.  Denies chest pain shortness of breath fevers chills or urinary symptoms.  Does endorse pain in the bilateral flank/back area secondary to the fall.  Denies urinary bowel incontinence no new numbness tingling or weakness.  Patient also complaining of pain in the right eye.  Follows with an ophthalmologist in [place] and receives intravitreal injections.  Last was on March 17.  The eye has been red and painful for about 2 weeks now they have not been able to get an appointment with the ophthalmologist.  She notes that the right eye typically has decreased vision compared to the left but that this worse over the last several days. ? ?  ? ?Past Medical History:  ?Diagnosis Date  ? Acute colitis 01/27/2017  ? Acute pyelonephritis   ? Acute upper GI bleed 01/25/2020  ? Anemia   ? iron deficiency anemia  ? Aortic arch aneurysm ([place])   ? BRCA negative 2014  ? CAD (coronary artery disease)   ? a. 08/2003 Cath:  LAD 30-40-med Rx; b. 11/2014 PCI: LAD 71m(3.25x23 Xience Alpine DES); c. 06/2015 PCI: D1 (2.25x12 Resolute Integrity DES); d. 06/2017 PCI: Patent mLAD stent, D2 95 (PTCA); e. 09/2017 PCI: D2 99ost (CBA); d. 12/2017 Cath: LM nl, LAD 363m80d (small), D1 40ost, D2 95ost, LCX 40p, RCA 40ost/p->Med rx for D2 given restenosis.  ? Closed nondisplaced intertrochanteric fracture of left femur (HCFarmington9/10/2018  ? Colitis 06/03/2015  ? Colon polyp   ? CVA (cerebral vascular accident) ([facility]  ? Left side weakness.   ? Degenerative tear of glenoid labrum of right shoulder 03/15/2017  ? Diabetes mellitus without complication (HCAlamo Heights  ? Family history of breast cancer   ? BRCA neg 2014  ? Femur fracture, left (HCTaylor Springs9/10/2018  ? Gastric ulcer 04/27/2011  ? Helicobacter pylori infection 11/23/2014  ? History of echocardiogram   ? a. 03/2017 Echo: EF 60-65%, no rwma; b. 02/2018 Echo: EF 60-65%, no rwma. Nl RV fxn. No cardiac source of emboli (admitted w/ stroke).  ? Hypertension   ? Iron deficiency anemia secondary to blood loss (chronic)   ? Malignant melanoma of skin of scalp (HCTroy  ? MI, acute, non ST segment elevation (HCBarnhart  ? Neuromuscular disorder (HCCassville  ? S/P drug eluting coronary stent placement 06/04/2015  ? Sepsis (HCShungnak2/14/2019  ? Sepsis secondary to UTI (HCTower Hill3/22/2022  ? Stroke ([facility]  ?  a. 02/2018 MRI: 34m late acute/early subacute L medial frontal lobe inarct; b. 02/2018 MRA No large vessel occlusion or aneurysm. Mod to sev L P2 stenosis. thready L vertebral artery, diffusely dzs'd; c. 02/2018 Carotid U/S: <50% bilat ICA dzs.  ? Unspecified atrial fibrillation (HWaikane 04/23/2020  ? ? ?Patient Active Problem List  ? Diagnosis Date Noted  ? Cerebrovascular accident (CVA) (HDickeyville 04/24/2021  ? Generalized idiopathic epilepsy and epileptic syndromes, not intractable, with status epilepticus (HSomerset 04/24/2021  ? Gait abnormality 04/24/2021  ? Hypertensive emergency   ? Urinary tract infection without hematuria   ? Hypertension associated  with diabetes (HGlencoe   ? Malnutrition of moderate degree 03/10/2021  ? Seizure (HBelington   ? Altered mental status 02/08/2021  ? Seizure disorder (HWalsh 02/08/2021  ? Acute focal neurological deficit 02/07/2021  ? Chronic kidney disease, stage III (moderate) (HCC)   ? Neurogenic bladder   ? Intermittent self-catheterization of bladder   ? History of ESBL E. coli infection   ? Memory loss 01/17/2021  ? Chronic migraine w/o aura w/o status migrainosus, not intractable 01/17/2021  ? Major depressive disorder, recurrent episode, moderate (HSummit 12/08/2020  ? Family history of malignant neoplasm of gastrointestinal tract   ? Near syncope 01/25/2020  ? Pulmonary embolism (HWhipholt 11/07/2019  ? Type II diabetes mellitus with renal manifestations (HFort Salonga 11/05/2019  ? Family history of breast cancer 05/23/2019  ? Family history of colon cancer 05/23/2019  ? Retinopathy 05/12/2019  ? Abnormal LFTs 04/18/2019  ? Esophageal dysphagia   ? Gastrointestinal tract imaging abnormality   ? Malignant neoplasm of vulva, unspecified (HMarienthal 03/10/2019  ? Peripheral vascular disease, unspecified (HEssex Village 03/10/2019  ? Paroxysmal supraventricular tachycardia (HChurch Creek 03/10/2019  ? Autonomic neuropathy 03/24/2018  ? H/O gastric bypass 03/02/2018  ? Hypertension associated with stage 3 chronic kidney disease due to type 2 diabetes mellitus (HBarnes City 02/20/2018  ? Insomnia 03/18/2017  ? Ischemic cardiomyopathy   ? Arthritis   ? Anxiety   ? Hx of colonic polyps   ? CAD (coronary artery disease)   ? Hypertensive heart disease   ? Status post bariatric surgery 06/04/2015  ? Carotid stenosis 04/30/2015  ? Iron deficiency anemia 03/22/2015  ? Vitamin B12 deficiency 02/18/2015  ? Malignant melanoma (HAuburn 08/25/2014  ? Chronic systolic CHF (congestive heart failure) (HFive Points   ? Incomplete bladder emptying 07/12/2014  ? Hypothyroidism 12/30/2013  ? Aberrant subclavian artery 11/17/2013  ? Multiple sclerosis (HMcNairy 11/02/2013  ? History of CVA with residual deficit 06/20/2013   ? Headache, migraine 05/29/2013  ? Hyperlipidemia   ? GERD (gastroesophageal reflux disease)   ? Demyelinating disease (HSalome 01/02/2011  ? Depression with anxiety 05/01/2008  ? Essential hypertension 05/01/2008  ? ? ? ?Physical Exam  ?Triage Vital Signs: ?ED Triage Vitals [05/05/21 1136]  ?Enc Vitals Group  ?   BP (!) 186/92  ?   Pulse Rate 70  ?   Resp 15  ?   Temp 98.1 ?F (36.7 ?C)  ?   Temp Source Oral  ?   SpO2 100 %  ?   Weight 159 lb (72.1 kg)  ?   Height '5\' 3"'  (1.6 m)  ?   Head Circumference   ?   [place]   ?   Pain Score 10  ?   Pain Loc   ?   Pain Edu?   ?   Excl. in GTallapoosa   ? ? ?Most recent vital signs: ?Vitals:  ? 05/05/21 1448 05/05/21 1552  ?  BP: (!) 158/82 (!) 158/82  ?Pulse: 61 62  ?Resp: 16 17  ?Temp:  98.1 ?F (36.7 ?C)  ?SpO2: 99% 99%  ? ? ? ?General: Awake, no distress.  ?CV:  Good peripheral perfusion.  ?Resp:  Normal effort.  ?Abd:  No distention.  Abdomen is soft and nontender, no ecchymosis ?Neuro:             Awake, Alert, Oriented x 3  ?Other:   ?No C-spine tenderness, there is thoracic and lumbar paraspinal and midline tenderness ?Ecchymosis of bilateral flanks with some overlying tenderness to palpation but no crepitus ?No anterior chest wall tenderness ?This is stable nontender, able to range both hips bilaterally, able to weight-bear ? ?Eye exam ?VA R 20/70 L 20./50 ?Right pupil slightly larger than left, both are reactive ?No consensual photophobia ?Right conjunctiva is injected with prominence around the limbus ?Pain with extraocular movements ?IOP R 8 L 11 ?No uptake on fluorescein exam ? ? ?ED Results / Procedures / Treatments  ?Labs ?(all labs ordered are listed, but only abnormal results are displayed) ?Labs Reviewed - No data to display ? ? ?EKG ? ? ? ?RADIOLOGY ?I reviewed the CXR which does not show any acute cardiopulmonary process; agree with radiology report   ? ?I reviewed the CT scan of the brain which does not show any acute intracranial process; agree with radiology report   ? ? ? ? ?PROCEDURES: ? ?Critical Care performed: No ? ?Procedures ? ? ? ? ?MEDICATIONS ORDERED IN ED: ?Medications  ?tetracaine (PONTOCAINE) 0.5 % ophthalmic solution 2 drop (2 drops Both Eyes Given b

## 2021-05-05 NOTE — ED Triage Notes (Addendum)
Pt states she has been having issues with balance since having seizures due to multiple strokes in the past , states she moves around via wheelchair while her son is at work, states she fell x3 yesterday while bending over. Pt also c/o redness and pain to the right eye after having any injection 3/17. Pt is a/ox4, states she has been having a HA and dizzy. Pt also c/o back pain to the mid back ?

## 2021-05-05 NOTE — Telephone Encounter (Signed)
?  Chief Complaint: Pt fell 4 times over the weekend.  Hit head real hard, on blood thinners.  History of strokes and seizures ?Symptoms: Headache 10/10 on pain scale even with Tylenol on board ?Frequency: Now ?Pertinent Negatives: Patient denies passing out, or loss of consciousness that she is aware of. ?Disposition: '[x]'$ ED /'[]'$ Urgent Care (no appt availability in office) / '[]'$ Appointment(In office/virtual)/ '[]'$  [facility]/ '[]'$ Home Care/ '[]'$ Refused Recommended Disposition /'[]'$ [facility]/ '[]'$  Follow-up with PCP ?Additional Notes: Going to [facility] ED  ?

## 2021-05-05 NOTE — Telephone Encounter (Signed)
Reason for Disposition ? Patient sounds very sick or weak to the triager ? ?Answer Assessment - Initial Assessment Questions ?1. MECHANISM: "How did the fall happen?" ?    Tonya with [facility] calling in.   OT visit.   Right eye is very inflamed.   Using Systane eye drops.   She fell 4 times yesterday.    ?10:30 AM letting dogs out and lost balance I think I was dizzy.    I hit my head real hard.     2nd time I was standing putting towels in the laundry and lost my balance in the afternoon,   3rd fall I standing by wheelchair picking up something happened at night,  4th fall happened early morning.   I think I got dizzy going to the bathroom. ? ?Right arm and right knee has skin tears.    I'm on blood thinners.    ?She falls all the time per the pt.    ?2. DOMESTIC VIOLENCE AND ELDER ABUSE SCREENING: "Did you fall because someone pushed you or tried to hurt you?" If Yes, ask: "Are you safe now?" ?    *No Answer* ?3. ONSET: "When did the fall happen?" (e.g., minutes, hours, or days ago) ?    Over the weekend 4 falls occurred. ?4. LOCATION: "What part of the body hit the ground?" (e.g., back, buttocks, head, hips, knees, hands, head, stomach) ?    Hit her head real hard when fell.   Last fall took her 4 hours to get up.   She just laid on the floor.   She got up on her own but it was a struggle.   ?She did not call 911 because not want to go back to the hospital.   She had seizures reason was in the hospital recently.    Her son sees her have the seizures.   Pt. Not know when she has them.   Son calls 911 when needed. ?5. INJURY: "Did you hurt (injure) yourself when you fell?" If Yes, ask: "What did you injure? Tell me more about this?" (e.g., body area; type of injury; pain severity)" ?    Hit head back of my head when I fell.   I've had strokes which causes the seizures.   The back of her head hurts.   Taking Tylenol for the pain.   Tender spot per Tonya.    She has a bad headache.    ?6. PAIN: "Is there any pain?"  If Yes, ask: "How bad is the pain?" (e.g., Scale 1-10; or mild,  ?moderate, severe) ?  - NONE (0): No pain ?  - MILD (1-3): Doesn't interfere with normal activities  ?  - MODERATE (4-7): Interferes with normal activities or awakens from sleep  ?  - SEVERE (8-10): Excruciating pain, unable to do any normal activities  ?    Headache 10 on scale.  Took Tylenol this morning for the headache. ?7. SIZE: For cuts, bruises, or swelling, ask: "How large is it?" (e.g., inches or centimeters)  ?    Tender spot on back of head    Did not loss consciousness any of the times she fell that she can recall.   Does not remember when she has seizures.   Son sees them. ?Standing BP now is 138/60.   142/62 sitting. ?8. PREGNANCY: "Is there any chance you are pregnant?" "When was your last menstrual period?" ?    N/A ?9. OTHER SYMPTOMS: "Do you have any  other symptoms?" (e.g., dizziness, fever, weakness; new onset or worsening).  ?    Headache ?10. CAUSE: "What do you think caused the fall (or falling)?" (e.g., tripped, dizzy spell) ?      Not sure ? ?Protocols used: Falls and Falling-A-AH ? ?

## 2021-05-05 NOTE — Discharge Instructions (Addendum)
Please make sure you are using your wheelchair at all times x-ray of your chest and CAT scans of your back did not show any acute injury.  You can take Tylenol as needed for pain. ? ?In regards to your eye pain, please either follow-up with your ophthalmologist or you can follow-up with Dr. Edison Pace at [facility] and that an tomorrow at 8:30 AM. ?

## 2021-05-05 NOTE — Telephone Encounter (Signed)
I spoke to the patient. A 90-day supply of lacosamide '200mg'$ , one tablet BID was sent to the pharmacy on 04/24/21 w/ refills. ? ?Reports she four times yesterday and last evening. She was in her wheelchair and "doing things she should not be doing". She gave the examples of reaching over to let her dog in from outside, leaning over to pick something up and sitting too close to the end of the seat. Says she is not walking unassisted. Her son was at work when the falls occurred. She hit her head multiple times. Large knot present with headache. Also, hit knees. She contacted her PCP this morning and was instructed to be seen in the ED for further evaluation. States her son is taking her now.  ?

## 2021-05-06 ENCOUNTER — Institutional Professional Consult (permissible substitution): Payer: Medicare Other | Admitting: Neurology

## 2021-05-07 ENCOUNTER — Inpatient Hospital Stay: Payer: Medicare Other | Admitting: Family Medicine

## 2021-05-07 DIAGNOSIS — D509 Iron deficiency anemia, unspecified: Secondary | ICD-10-CM

## 2021-05-07 DIAGNOSIS — E1122 Type 2 diabetes mellitus with diabetic chronic kidney disease: Secondary | ICD-10-CM | POA: Diagnosis not present

## 2021-05-07 DIAGNOSIS — I129 Hypertensive chronic kidney disease with stage 1 through stage 4 chronic kidney disease, or unspecified chronic kidney disease: Secondary | ICD-10-CM | POA: Diagnosis not present

## 2021-05-07 DIAGNOSIS — M810 Age-related osteoporosis without current pathological fracture: Secondary | ICD-10-CM

## 2021-05-07 DIAGNOSIS — E039 Hypothyroidism, unspecified: Secondary | ICD-10-CM

## 2021-05-07 DIAGNOSIS — I2510 Atherosclerotic heart disease of native coronary artery without angina pectoris: Secondary | ICD-10-CM

## 2021-05-07 DIAGNOSIS — E1142 Type 2 diabetes mellitus with diabetic polyneuropathy: Secondary | ICD-10-CM

## 2021-05-07 DIAGNOSIS — N1832 Chronic kidney disease, stage 3b: Secondary | ICD-10-CM | POA: Diagnosis not present

## 2021-05-07 DIAGNOSIS — I69354 Hemiplegia and hemiparesis following cerebral infarction affecting left non-dominant side: Secondary | ICD-10-CM

## 2021-05-07 DIAGNOSIS — G40911 Epilepsy, unspecified, intractable, with status epilepticus: Secondary | ICD-10-CM | POA: Diagnosis not present

## 2021-05-08 ENCOUNTER — Other Ambulatory Visit: Payer: Self-pay

## 2021-05-08 ENCOUNTER — Inpatient Hospital Stay
Admission: EM | Admit: 2021-05-08 | Discharge: 2021-05-13 | DRG: 247 | Disposition: A | Discharge status: Home / Self Care | Payer: Medicare Other | Attending: Internal Medicine | Admitting: Internal Medicine

## 2021-05-08 ENCOUNTER — Encounter: Payer: Self-pay | Admitting: Emergency Medicine

## 2021-05-08 ENCOUNTER — Emergency Department: Payer: Medicare Other

## 2021-05-08 DIAGNOSIS — E039 Hypothyroidism, unspecified: Secondary | ICD-10-CM | POA: Diagnosis present

## 2021-05-08 DIAGNOSIS — I129 Hypertensive chronic kidney disease with stage 1 through stage 4 chronic kidney disease, or unspecified chronic kidney disease: Secondary | ICD-10-CM

## 2021-05-08 DIAGNOSIS — I130 Hypertensive heart and chronic kidney disease with heart failure and stage 1 through stage 4 chronic kidney disease, or unspecified chronic kidney disease: Secondary | ICD-10-CM | POA: Diagnosis present

## 2021-05-08 DIAGNOSIS — Z6841 Body Mass Index (BMI) 40.0 and over, adult: Secondary | ICD-10-CM | POA: Diagnosis not present

## 2021-05-08 DIAGNOSIS — Z823 Family history of stroke: Secondary | ICD-10-CM

## 2021-05-08 DIAGNOSIS — E1129 Type 2 diabetes mellitus with other diabetic kidney complication: Secondary | ICD-10-CM | POA: Diagnosis present

## 2021-05-08 DIAGNOSIS — N182 Chronic kidney disease, stage 2 (mild): Secondary | ICD-10-CM

## 2021-05-08 DIAGNOSIS — W050XXA Fall from non-moving wheelchair, initial encounter: Secondary | ICD-10-CM | POA: Diagnosis present

## 2021-05-08 DIAGNOSIS — I2510 Atherosclerotic heart disease of native coronary artery without angina pectoris: Secondary | ICD-10-CM | POA: Diagnosis present

## 2021-05-08 DIAGNOSIS — I252 Old myocardial infarction: Secondary | ICD-10-CM

## 2021-05-08 DIAGNOSIS — Z993 Dependence on wheelchair: Secondary | ICD-10-CM | POA: Diagnosis not present

## 2021-05-08 DIAGNOSIS — Z86010 Personal history of colonic polyps: Secondary | ICD-10-CM

## 2021-05-08 DIAGNOSIS — E785 Hyperlipidemia, unspecified: Secondary | ICD-10-CM | POA: Diagnosis present

## 2021-05-08 DIAGNOSIS — N1831 Chronic kidney disease, stage 3a: Secondary | ICD-10-CM

## 2021-05-08 DIAGNOSIS — I5022 Chronic systolic (congestive) heart failure: Secondary | ICD-10-CM | POA: Diagnosis present

## 2021-05-08 DIAGNOSIS — I160 Hypertensive urgency: Secondary | ICD-10-CM | POA: Diagnosis present

## 2021-05-08 DIAGNOSIS — I200 Unstable angina: Secondary | ICD-10-CM | POA: Diagnosis not present

## 2021-05-08 DIAGNOSIS — Z86711 Personal history of pulmonary embolism: Secondary | ICD-10-CM

## 2021-05-08 DIAGNOSIS — G40A09 Absence epileptic syndrome, not intractable, without status epilepticus: Secondary | ICD-10-CM | POA: Diagnosis present

## 2021-05-08 DIAGNOSIS — I10 Essential (primary) hypertension: Secondary | ICD-10-CM | POA: Diagnosis present

## 2021-05-08 DIAGNOSIS — R079 Chest pain, unspecified: Secondary | ICD-10-CM | POA: Diagnosis present

## 2021-05-08 DIAGNOSIS — R569 Unspecified convulsions: Secondary | ICD-10-CM | POA: Diagnosis not present

## 2021-05-08 DIAGNOSIS — N183 Chronic kidney disease, stage 3 unspecified: Secondary | ICD-10-CM | POA: Diagnosis present

## 2021-05-08 DIAGNOSIS — I701 Atherosclerosis of renal artery: Secondary | ICD-10-CM | POA: Diagnosis present

## 2021-05-08 DIAGNOSIS — E11649 Type 2 diabetes mellitus with hypoglycemia without coma: Secondary | ICD-10-CM | POA: Diagnosis not present

## 2021-05-08 DIAGNOSIS — H5711 Ocular pain, right eye: Secondary | ICD-10-CM | POA: Diagnosis present

## 2021-05-08 DIAGNOSIS — I214 Non-ST elevation (NSTEMI) myocardial infarction: Secondary | ICD-10-CM | POA: Diagnosis present

## 2021-05-08 DIAGNOSIS — Z888 Allergy status to other drugs, medicaments and biological substances status: Secondary | ICD-10-CM

## 2021-05-08 DIAGNOSIS — Z8249 Family history of ischemic heart disease and other diseases of the circulatory system: Secondary | ICD-10-CM

## 2021-05-08 DIAGNOSIS — N1832 Chronic kidney disease, stage 3b: Secondary | ICD-10-CM

## 2021-05-08 DIAGNOSIS — I6930 Unspecified sequelae of cerebral infarction: Secondary | ICD-10-CM

## 2021-05-08 DIAGNOSIS — Z9049 Acquired absence of other specified parts of digestive tract: Secondary | ICD-10-CM

## 2021-05-08 DIAGNOSIS — E1122 Type 2 diabetes mellitus with diabetic chronic kidney disease: Secondary | ICD-10-CM | POA: Diagnosis present

## 2021-05-08 DIAGNOSIS — Z808 Family history of malignant neoplasm of other organs or systems: Secondary | ICD-10-CM

## 2021-05-08 DIAGNOSIS — E782 Mixed hyperlipidemia: Secondary | ICD-10-CM | POA: Diagnosis not present

## 2021-05-08 DIAGNOSIS — N179 Acute kidney failure, unspecified: Secondary | ICD-10-CM | POA: Diagnosis not present

## 2021-05-08 DIAGNOSIS — Z83438 Family history of other disorder of lipoprotein metabolism and other lipidemia: Secondary | ICD-10-CM

## 2021-05-08 DIAGNOSIS — Z818 Family history of other mental and behavioral disorders: Secondary | ICD-10-CM

## 2021-05-08 DIAGNOSIS — K209 Esophagitis, unspecified without bleeding: Secondary | ICD-10-CM

## 2021-05-08 DIAGNOSIS — D631 Anemia in chronic kidney disease: Secondary | ICD-10-CM | POA: Diagnosis present

## 2021-05-08 DIAGNOSIS — I25118 Atherosclerotic heart disease of native coronary artery with other forms of angina pectoris: Secondary | ICD-10-CM | POA: Diagnosis not present

## 2021-05-08 DIAGNOSIS — G43909 Migraine, unspecified, not intractable, without status migrainosus: Secondary | ICD-10-CM | POA: Diagnosis present

## 2021-05-08 DIAGNOSIS — R296 Repeated falls: Secondary | ICD-10-CM | POA: Diagnosis present

## 2021-05-08 DIAGNOSIS — Z8639 Personal history of other endocrine, nutritional and metabolic disease: Secondary | ICD-10-CM | POA: Diagnosis present

## 2021-05-08 DIAGNOSIS — Z85820 Personal history of malignant melanoma of skin: Secondary | ICD-10-CM

## 2021-05-08 DIAGNOSIS — G8929 Other chronic pain: Secondary | ICD-10-CM | POA: Diagnosis present

## 2021-05-08 DIAGNOSIS — N189 Chronic kidney disease, unspecified: Secondary | ICD-10-CM | POA: Diagnosis not present

## 2021-05-08 DIAGNOSIS — F319 Bipolar disorder, unspecified: Secondary | ICD-10-CM | POA: Diagnosis present

## 2021-05-08 DIAGNOSIS — I119 Hypertensive heart disease without heart failure: Secondary | ICD-10-CM | POA: Diagnosis not present

## 2021-05-08 DIAGNOSIS — I69354 Hemiplegia and hemiparesis following cerebral infarction affecting left non-dominant side: Secondary | ICD-10-CM

## 2021-05-08 DIAGNOSIS — Z841 Family history of disorders of kidney and ureter: Secondary | ICD-10-CM

## 2021-05-08 DIAGNOSIS — Z9884 Bariatric surgery status: Secondary | ICD-10-CM

## 2021-05-08 DIAGNOSIS — Z87891 Personal history of nicotine dependence: Secondary | ICD-10-CM

## 2021-05-08 LAB — CBC
HCT: 37.3 % (ref 36.0–46.0)
Hemoglobin: 11.4 g/dL — ABNORMAL LOW (ref 12.0–15.0)
MCH: 25.9 pg — ABNORMAL LOW (ref 26.0–34.0)
MCHC: 30.6 g/dL (ref 30.0–36.0)
MCV: 84.8 fL (ref 80.0–100.0)
Platelets: 186 10*3/uL (ref 150–400)
RBC: 4.4 MIL/uL (ref 3.87–5.11)
RDW: 13.6 % (ref 11.5–15.5)
WBC: 8.2 10*3/uL (ref 4.0–10.5)
nRBC: 0 % (ref 0.0–0.2)

## 2021-05-08 LAB — GLUCOSE, CAPILLARY
Glucose-Capillary: 217 mg/dL — ABNORMAL HIGH (ref 70–99)
Glucose-Capillary: 72 mg/dL (ref 70–99)

## 2021-05-08 LAB — TROPONIN I (HIGH SENSITIVITY)
Troponin I (High Sensitivity): 36 ng/L — ABNORMAL HIGH (ref <18)
Troponin I (High Sensitivity): 35 ng/L — ABNORMAL HIGH (ref <18)

## 2021-05-08 LAB — PROTIME-INR
INR: 1 (ref 0.8–1.2)
Prothrombin Time: 13.2 seconds (ref 11.4–15.2)

## 2021-05-08 LAB — BASIC METABOLIC PANEL
Anion gap: 7 (ref 5–15)
BUN: 23 mg/dL — ABNORMAL HIGH (ref 6–20)
CO2: 27 mmol/L (ref 22–32)
Calcium: 8.8 mg/dL — ABNORMAL LOW (ref 8.9–10.3)
Chloride: 106 mmol/L (ref 98–111)
Creatinine, Ser: 1.57 mg/dL — ABNORMAL HIGH (ref 0.44–1.00)
GFR, Estimated: 39 mL/min — ABNORMAL LOW (ref >60)
Glucose, Bld: 119 mg/dL — ABNORMAL HIGH (ref 70–99)
Potassium: 4.7 mmol/L (ref 3.5–5.1)
Sodium: 140 mmol/L (ref 135–145)

## 2021-05-08 LAB — APTT: aPTT: 38 seconds — ABNORMAL HIGH (ref 24–36)

## 2021-05-08 LAB — HEPARIN LEVEL (UNFRACTIONATED): Heparin Unfractionated: 0.23 IU/mL — ABNORMAL LOW (ref 0.30–0.70)

## 2021-05-08 LAB — POC URINE PREG, ED: Preg Test, Ur: NEGATIVE

## 2021-05-08 MED ORDER — LEVOTHYROXINE SODIUM 50 MCG PO TABS
25.0000 ug | ORAL_TABLET | Freq: Every day | ORAL | Status: DC
  Administered 2021-05-10 – 2021-05-13 (×4): 25 ug via ORAL
  Filled 2021-05-08 (×4): qty 1

## 2021-05-08 MED ORDER — MORPHINE SULFATE (PF) 4 MG/ML IV SOLN
4.0000 mg | Freq: Once | INTRAVENOUS | Status: AC
  Administered 2021-05-08: 4 mg via INTRAVENOUS
  Filled 2021-05-08: qty 1

## 2021-05-08 MED ORDER — PANTOPRAZOLE SODIUM 40 MG PO TBEC
40.0000 mg | DELAYED_RELEASE_TABLET | Freq: Two times a day (BID) | ORAL | Status: DC
  Administered 2021-05-08 – 2021-05-13 (×10): 40 mg via ORAL
  Filled 2021-05-08 (×10): qty 1

## 2021-05-08 MED ORDER — NITROGLYCERIN 0.4 MG SL SUBL
SUBLINGUAL_TABLET | SUBLINGUAL | Status: AC
  Filled 2021-05-08: qty 1

## 2021-05-08 MED ORDER — MORPHINE SULFATE (PF) 2 MG/ML IV SOLN
1.0000 mg | INTRAVENOUS | Status: DC | PRN
  Administered 2021-05-08 – 2021-05-11 (×7): 1 mg via INTRAVENOUS
  Filled 2021-05-08 (×8): qty 1

## 2021-05-08 MED ORDER — ONDANSETRON HCL 4 MG/2ML IJ SOLN
4.0000 mg | Freq: Once | INTRAMUSCULAR | Status: AC
  Administered 2021-05-08: 4 mg via INTRAVENOUS
  Filled 2021-05-08: qty 2

## 2021-05-08 MED ORDER — AMLODIPINE BESYLATE 10 MG PO TABS
10.0000 mg | ORAL_TABLET | Freq: Every day | ORAL | Status: DC
Start: 2021-05-09 — End: 2021-05-13
  Administered 2021-05-09 – 2021-05-13 (×5): 10 mg via ORAL
  Filled 2021-05-08 (×5): qty 1

## 2021-05-08 MED ORDER — CLOPIDOGREL BISULFATE 75 MG PO TABS
75.0000 mg | ORAL_TABLET | Freq: Every day | ORAL | Status: DC
  Administered 2021-05-09 – 2021-05-11 (×3): 75 mg via ORAL
  Filled 2021-05-08 (×3): qty 1

## 2021-05-08 MED ORDER — NITROGLYCERIN 0.4 MG SL SUBL
0.4000 mg | SUBLINGUAL_TABLET | SUBLINGUAL | Status: DC | PRN
  Administered 2021-05-08 – 2021-05-10 (×14): 0.4 mg via SUBLINGUAL
  Filled 2021-05-08 (×8): qty 1

## 2021-05-08 MED ORDER — MIRABEGRON ER 50 MG PO TB24
50.0000 mg | ORAL_TABLET | Freq: Every day | ORAL | Status: DC
  Administered 2021-05-09 – 2021-05-13 (×5): 50 mg via ORAL
  Filled 2021-05-08 (×5): qty 1

## 2021-05-08 MED ORDER — GABAPENTIN 300 MG PO CAPS
600.0000 mg | ORAL_CAPSULE | Freq: Two times a day (BID) | ORAL | Status: DC
  Administered 2021-05-08 – 2021-05-13 (×10): 600 mg via ORAL
  Filled 2021-05-08 (×10): qty 2

## 2021-05-08 MED ORDER — HEPARIN (PORCINE) 25000 UT/250ML-% IV SOLN
1300.0000 [IU]/h | INTRAVENOUS | Status: DC
  Administered 2021-05-08: 850 [IU]/h via INTRAVENOUS
  Administered 2021-05-10 – 2021-05-11 (×2): 1300 [IU]/h via INTRAVENOUS
  Filled 2021-05-08 (×5): qty 250

## 2021-05-08 MED ORDER — ASPIRIN EC 81 MG PO TBEC
81.0000 mg | DELAYED_RELEASE_TABLET | Freq: Every day | ORAL | Status: DC
  Administered 2021-05-09 – 2021-05-13 (×5): 81 mg via ORAL
  Filled 2021-05-08 (×5): qty 1

## 2021-05-08 MED ORDER — ASPIRIN EC 81 MG PO TBEC: 81.0000 mg | DELAYED_RELEASE_TABLET | Freq: Every day | ORAL | Status: DC

## 2021-05-08 MED ORDER — HEPARIN BOLUS VIA INFUSION
4000.0000 [IU] | Freq: Once | INTRAVENOUS | Status: AC
  Administered 2021-05-08: 4000 [IU] via INTRAVENOUS
  Filled 2021-05-08: qty 4000

## 2021-05-08 MED ORDER — POLYVINYL ALCOHOL 1.4 % OP SOLN: Freq: Every day | OPHTHALMIC | Status: DC | PRN

## 2021-05-08 MED ORDER — ACETAMINOPHEN 325 MG PO TABS: 650.0000 mg | ORAL_TABLET | ORAL | Status: DC | PRN

## 2021-05-08 MED ORDER — DICLOFENAC SODIUM 1 % EX GEL: 2.0000 g | Freq: Four times a day (QID) | CUTANEOUS | Status: DC | PRN

## 2021-05-08 MED ORDER — ROSUVASTATIN CALCIUM 10 MG PO TABS
40.0000 mg | ORAL_TABLET | Freq: Every day | ORAL | Status: DC
  Administered 2021-05-08 – 2021-05-12 (×5): 40 mg via ORAL
  Filled 2021-05-08: qty 4
  Filled 2021-05-08: qty 2
  Filled 2021-05-08: qty 4
  Filled 2021-05-08 (×2): qty 2

## 2021-05-08 MED ORDER — ALPRAZOLAM 0.5 MG PO TABS
1.0000 mg | ORAL_TABLET | Freq: Two times a day (BID) | ORAL | Status: DC
  Administered 2021-05-08 – 2021-05-13 (×10): 1 mg via ORAL
  Filled 2021-05-08 (×10): qty 2

## 2021-05-08 MED ORDER — LEVETIRACETAM 500 MG PO TABS
1000.0000 mg | ORAL_TABLET | Freq: Two times a day (BID) | ORAL | Status: DC
  Administered 2021-05-08 – 2021-05-13 (×10): 1000 mg via ORAL
  Filled 2021-05-08 (×12): qty 2

## 2021-05-08 MED ORDER — ONDANSETRON HCL 4 MG/2ML IJ SOLN
4.0000 mg | Freq: Four times a day (QID) | INTRAMUSCULAR | Status: DC | PRN
  Administered 2021-05-08 – 2021-05-11 (×2): 4 mg via INTRAVENOUS
  Filled 2021-05-08 (×2): qty 2

## 2021-05-08 MED ORDER — BACLOFEN 10 MG PO TABS: 5.0000 mg | ORAL_TABLET | Freq: Two times a day (BID) | ORAL | Status: DC | PRN

## 2021-05-08 MED ORDER — PREDNISOLONE ACETATE 1 % OP SUSP
1.0000 [drp] | Freq: Four times a day (QID) | OPHTHALMIC | Status: DC
  Administered 2021-05-08 – 2021-05-13 (×19): 1 [drp] via OPHTHALMIC
  Filled 2021-05-08: qty 1

## 2021-05-08 MED ORDER — ISOSORBIDE MONONITRATE ER 60 MG PO TB24
120.0000 mg | ORAL_TABLET | Freq: Every day | ORAL | Status: DC
  Administered 2021-05-09 – 2021-05-10 (×2): 120 mg via ORAL
  Filled 2021-05-08 (×2): qty 2

## 2021-05-08 MED ORDER — MIRTAZAPINE 15 MG PO TABS
7.5000 mg | ORAL_TABLET | Freq: Every day | ORAL | Status: DC
  Administered 2021-05-08 – 2021-05-12 (×5): 7.5 mg via ORAL
  Filled 2021-05-08 (×5): qty 1

## 2021-05-08 MED ORDER — CARVEDILOL 6.25 MG PO TABS
6.2500 mg | ORAL_TABLET | Freq: Two times a day (BID) | ORAL | Status: DC
  Administered 2021-05-08 – 2021-05-11 (×5): 6.25 mg via ORAL
  Filled 2021-05-08 (×6): qty 1

## 2021-05-08 MED ORDER — VENLAFAXINE HCL ER 75 MG PO CP24
75.0000 mg | ORAL_CAPSULE | Freq: Every day | ORAL | Status: DC
  Administered 2021-05-09 – 2021-05-13 (×5): 75 mg via ORAL
  Filled 2021-05-08 (×6): qty 1

## 2021-05-08 MED ORDER — INSULIN ASPART 100 UNIT/ML IJ SOLN
0.0000 [IU] | INTRAMUSCULAR | Status: DC
  Administered 2021-05-08: 3 [IU] via SUBCUTANEOUS
  Administered 2021-05-09: 2 [IU] via SUBCUTANEOUS
  Administered 2021-05-09: 7 [IU] via SUBCUTANEOUS
  Filled 2021-05-08 (×5): qty 1

## 2021-05-08 MED ORDER — OFLOXACIN 0.3 % OP SOLN
1.0000 [drp] | Freq: Four times a day (QID) | OPHTHALMIC | Status: DC
  Administered 2021-05-08 – 2021-05-13 (×19): 1 [drp] via OPHTHALMIC
  Filled 2021-05-08: qty 5

## 2021-05-08 MED ORDER — RIMEGEPANT SULFATE 75 MG PO TBDP: 75.0000 mg | ORAL_TABLET | ORAL | Status: DC | PRN

## 2021-05-08 MED ORDER — LACOSAMIDE 50 MG PO TABS
200.0000 mg | ORAL_TABLET | Freq: Two times a day (BID) | ORAL | Status: DC
  Administered 2021-05-08 – 2021-05-13 (×10): 200 mg via ORAL
  Filled 2021-05-08 (×10): qty 4

## 2021-05-08 NOTE — ED Notes (Signed)
Informed RN bed assigned 

## 2021-05-08 NOTE — Assessment & Plan Note (Addendum)
Appear to be secondary to coronary artery disease. ?

## 2021-05-08 NOTE — Consult Note (Signed)
ANTICOAGULATION CONSULT NOTE  ?Pharmacy Consult for heparin ?Indication: chest pain/ACS ? ?Allergies  ?Allergen Reactions  ? Desyrel [Trazodone] Other (See Comments)  ?  Pt states this medication brings on seizures   ? Lipitor [Atorvastatin] Other (See Comments)  ?  Leg pains  ? ? ?Patient Measurements: ?Height: _0  (160 cm) ?Weight: 75 kg (165 lb 4.8 oz) ?IBW/kg (Calculated) : 52.4 ?Heparin Dosing Weight: 68.3 kg ? ?Vital Signs: ?Temp: 97.6 ?F (36.4 ?C) (04/06 2306) ?Temp Source: Oral (04/06 1956) ?BP: 153/81 (04/06 2306) ?Pulse Rate: 66 (04/06 2306) ? ?Labs: ?Recent Labs  ?  05/08/21 ?1300 05/08/21 ?1501 05/08/21 ?2254  ?HGB 11.4*  --   --   ?HCT 37.3  --   --   ?PLT 186  --   --   ?APTT 38*  --   --   ?LABPROT 13.2  --   --   ?INR 1.0  --   --   ?HEPARINUNFRC  --   --  0.23*  ?CREATININE 1.57*  --   --   ?TROPONINIHS 36* 35*  --   ? ? ? ?Estimated Creatinine Clearance: 40.6 mL/min (A) (by C-G formula based on SCr of 1.57 mg/dL (H)). ? ? ?Medical History: ?Past Medical History:  ?Diagnosis Date  ? Acute colitis 01/27/2017  ? Acute pyelonephritis   ? Acute upper GI bleed 01/25/2020  ? Anemia   ? iron deficiency anemia  ? Aortic arch aneurysm ([place])   ? BRCA negative 2014  ? CAD (coronary artery disease)   ? a. 08/2003 Cath: LAD 30-40-med Rx; b. 11/2014 PCI: LAD 60m(3.25x23 Xience Alpine DES); c. 06/2015 PCI: D1 (2.25x12 Resolute Integrity DES); d. 06/2017 PCI: Patent mLAD stent, D2 95 (PTCA); e. 09/2017 PCI: D2 99ost (CBA); d. 12/2017 Cath: LM nl, LAD 339m80d (small), D1 40ost, D2 95ost, LCX 40p, RCA 40ost/p->Med rx for D2 given restenosis.  ? Closed nondisplaced intertrochanteric fracture of left femur (HCSan Jose9/10/2018  ? Colitis 06/03/2015  ? Colon polyp   ? [facility]  ? Left side weakness.   ? Degenerative tear of glenoid labrum of right shoulder 03/15/2017  ? Diabetes mellitus without complication (HCGalestown  ? Family history of breast cancer   ? BRCA neg 2014  ? Femur fracture, left (HCCurrie 10/12/2018  ? Gastric ulcer 04/27/2011  ? Helicobacter pylori infection 11/23/2014  ? History of echocardiogram   ? a. 03/2017 Echo: EF 60-65%, no rwma; b. 02/2018 Echo: EF 60-65%, no rwma. Nl RV fxn. No cardiac source of emboli (admitted w/ stroke).  ? Hypertension   ? Iron deficiency anemia secondary to blood loss (chronic)   ? Malignant melanoma of skin of scalp (HCRock[place]  ? MI, acute, non ST segment elevation (HCWaterville  ? Neuromuscular disorder (HCThynedale  ? S/P drug eluting coronary stent placement 06/04/2015  ? Sepsis (HCGlennallen2/14/2019  ? Sepsis secondary to UTI (HCHarts3/22/2022  ? [facility] ([facility]  ? a. 02/2018 MRI: 40m29mate acute/early subacute L medial frontal lobe inarct; b. 02/2018 MRA No large vessel occlusion or aneurysm. Mod to sev L P2 stenosis. thready L vertebral artery, diffusely dzs'd; c. 02/2018 Carotid U/S: <50% bilat ICA dzs.  ? Unspecified atrial fibrillation (HCCMount Summit/22/2022  ? ? ?Medications:  ?No PTA anticoagulation, only aspirin 81 mg daily  ? ?Assessment: ?[age]o. female with a past medical history of CAD, diabetes, and HTN presenting to the emergency department for intermittent chest pain x3  days not relieved by nitroglycerin. Pharmacy has been consulted for heparin dosing for ACS.  ? ?Baseline labs: Hgb 11.4, plts 186, aPTT and INR ordered  ? ?Goal of Therapy:  ?Heparin level 0.3-0.7 units/ml ?Monitor platelets by anticoagulation protocol: Yes ? ?4/06 2254 HL 0.23, subtherapeutic ?  ?Plan:  ?Increase heparin infusion to 1000 units/hr ?Recheck HL in ~ 6 hr after rate change ?CBC daily while on heparin. ? ?Renda Rolls, PharmD, MBA ?05/08/2021 ?11:32 PM ? ? ? ?

## 2021-05-08 NOTE — Consult Note (Signed)
? ? ? ?Cardiology Consultation:  ? ?Patient ID: [name]; [id number]; 07/16/1970  ? ?Admit date: 05/08/2021 ?Date of Consult: 05/08/2021 ? ?Primary Care Provider: [name], MD ?Primary Cardiologist: [name] ?Primary Electrophysiologist:  Caryl Comes ? ? ?Patient Profile:  ? ?[name] is a 51 y.o. female with a hx of CAD status post prior PCI as outlined below with chronic angina, HFrEF secondary to ICM with subsequent normalization of LV systolic function in 8182, CKD stage III, GI bleed in 01/2020 requiring "10 units" of pRBC, CVAs, left carotid to subclavian artery bypass with subclavian artery ligation, aortic arch aneurysm, anemia, obesity status post gastric bypass, DM with polyneuropathy and history of hypoglycemia, bipolar disorder, migraine disorder, HTN, HLD, frequent falls/syncope/near syncope, and recent eye infection s/p surgery on 05/07/2021 who is being seen today for the evaluation of chest pain at the request of Dr. Sheppard Coil. ? ?History of Present Illness:  ? ?[name] was diagnosed with CAD in 08/2003 with LHC at that time showing moderate LAD disease that was medically managed.  Repeat LHC in 2016 with PCI/DES placement to the mid LAD.  Escalation of medical therapy/antianginal therapy has been limited by hypotension/adverse effects.  In 06/2015, she had repeat LHC that showed a patent LAD stent with development of significant D1 disease that was successfully treated with PCI/DES. With recurrent angina in 06/2017, LHC showed severe stenosis in the D2 s/p PTCA. Recurrent angina in 08/2017 led to repeat LHC that showed restenosis of the D2 that was treated with cutting balloon angioplasty. Repeat LHC in 12/2017 showed 95% ostial stenosis/restenosis of the D2 with significant apical LAD disease. Given the recurrent restenosis with the D2, medical therapy was felt to be warranted. She was admitted in 02/2018 with CVA with MRI showing a late acute/early subacute left medial frontal  lobe infarct. Echo at that time showed showed normal LVSF without cardiac source of emboli.   She was admitted to the hospital in 04/2019 chest pain in the setting of hypertensive urgency.  Cardiac enzymes were negative.  She underwent LHC on 04/20/2019 which showed no significant change in her coronary anatomy dating back to cardiac cath from 2019 with severe native vessel CAD, patent stents in the LAD and diagonal, stable severe ostial/proximal D2 disease that was without change when compared to prior cath and not amenable to stenting.  Her distal LAD was small and tapered to severe disease which also was not amenable to stenting.  Continued medical management was recommended.  She was admitted in 11/2019 with chronic angina and ruled out.  Echo demonstrated a preserved LV systolic function with no regional wall motion abnormalities.  Due to persistent angina, she subsequently, and most recently underwent LHC in 11/2019 which demonstrated patent stents in the mid LAD and first diagonal with mild to moderate in-stent restenosis, stable moderate mid RCA stenosis, stable diffuse disease in the superior branch of the first diagonal, moderate mid LAD stenosis at the bifurcation of a second diagonal which had no significant ostial disease.  There was severe progression of stenosis in the mid to distal LAD.  In addition, there was moderate to severe disease in the apical LAD.  She underwent successful PCI/DES to the mid to distal LAD.  She was subsequently admitted to the hospital in 01/2020 with an acute upper GI bleed requiring multiple units of PRBC.  She was subsequently transferred to [facility] for tertiary level care.  Outpatient cardiac monitoring in 04/2020 showed sinus rhythm with  1 run of NSVT lasting 4 beats and 2 episodes of SVT lasting 5 beats.  Echo at that time showed an EF of 60 to 65%, no regional wall motion abnormalities, grade 1 diastolic dysfunction, normal RV systolic function and ventricular cavity size,  and mild to moderate aortic valve sclerosis without evidence of stenosis.  Most recent echo from 12/2020 demonstrated an EF of 55 to 60%, no regional wall motion abnormalities, normal LV diastolic function parameters, normal RV systolic function, trivial mitral regurgitation.  More recently, she has been evaluated in the ED and admitted for altered mental status several times.  She was admitted to the hospital in 03/2021 with absence seizure's.  ? ?More recently, she has been having an increase in her falls.  She was recently diagnosed with an eye infection and underwent ophthalmologic surgery on 05/07/2021.  She has a patch over this eye currently.  She suffered 4 falls earlier this week without associated syncope.  She did not feel like she injured her chest with these falls.  She did not hit her head or suffer LOC.  She presented to [facility] on 05/08/2021 with recurrent chest pain that feels similar to her prior episodes.  Pain was exacerbated by drinking sweet tea.  Pain did not improve with SL NTG x5 at home.  BP in the ED has ranged from 270-623 systolic.  Afebrile.  Heart rate in the 60s to 80s bpm.  Oxygen saturations 100% on room air.  EKG showed sinus rhythm with no acute ST-T changes.  Chest x-ray showed no acute cardiopulmonary process.  Initial high-sensitivity troponin 36 with a delta troponin of 35.  Hgb low though stable at 11.4.  In the ED, she received morphine and Zofran.  Upon admission she was placed on a heparin drip.  Currently without angina. ? ? ? ?Past Medical History:  ?Diagnosis Date  ? Acute colitis 01/27/2017  ? Acute pyelonephritis   ? Acute upper GI bleed 01/25/2020  ? Anemia   ? iron deficiency anemia  ? Aortic arch aneurysm ([place])   ? BRCA negative 2014  ? CAD (coronary artery disease)   ? a. 08/2003 Cath: LAD 30-40-med Rx; b. 11/2014 PCI: LAD 77m(3.25x23 Xience Alpine DES); c. 06/2015 PCI: D1 (2.25x12 Resolute Integrity DES); d. 06/2017 PCI: Patent mLAD stent, D2 95 (PTCA); e. 09/2017 PCI: D2  99ost (CBA); d. 12/2017 Cath: LM nl, LAD 363m80d (small), D1 40ost, D2 95ost, LCX 40p, RCA 40ost/p->Med rx for D2 given restenosis.  ? Closed nondisplaced intertrochanteric fracture of left femur (HCMashpee Neck9/10/2018  ? Colitis 06/03/2015  ? Colon polyp   ? CVA (cerebral vascular accident) ([facility]  ? Left side weakness.   ? Degenerative tear of glenoid labrum of right shoulder 03/15/2017  ? Diabetes mellitus without complication (HCFall [place]  ? Family history of breast cancer   ? BRCA neg 2014  ? Femur fracture, left (HCWilmore9/10/2018  ? Gastric ulcer 04/27/2011  ? Helicobacter pylori infection 11/23/2014  ? History of echocardiogram   ? a. 03/2017 Echo: EF 60-65%, no rwma; b. 02/2018 Echo: EF 60-65%, no rwma. Nl RV fxn. No cardiac source of emboli (admitted w/ stroke).  ? Hypertension   ? Iron deficiency anemia secondary to blood loss (chronic)   ? Malignant melanoma of skin of scalp (HCHanover  ? MI, acute, non ST segment elevation (HCPlum[place]  ? Neuromuscular disorder (HCCharent[place]  ? S/P drug eluting coronary stent placement 06/04/2015  ? Sepsis (HCBertram2/14/2019  ?  Sepsis secondary to UTI ([place]) 04/23/2020  ? Stroke [facility])   ? a. 02/2018 MRI: 91m late acute/early subacute L medial frontal lobe inarct; b. 02/2018 MRA No large vessel occlusion or aneurysm. Mod to sev L P2 stenosis. thready L vertebral artery, diffusely dzs'd; c. 02/2018 Carotid U/S: <50% bilat ICA dzs.  ? Unspecified atrial fibrillation (HGaastra 04/23/2020  ? ? ?Past Surgical History:  ?Procedure Laterality Date  ? APPENDECTOMY    ? BALLOON ENTEROSCOPY  02/06/2020  ? [place] ? BIOPSY N/A 03/14/2020  ? Procedure: BIOPSY;  Surgeon: WLucilla Lame MD;  [facility]  Service: Endoscopy;  Laterality: N/A;  ? CARDIAC CATHETERIZATION N/A 11/09/2014  ? Procedure: Coronary Angiography;  Surgeon: TMinna Merritts MD;  [facility];  Service: Cardiovascular;  Laterality: N/A;  ? CARDIAC CATHETERIZATION N/A 11/12/2014  ? Procedure: Coronary Stent Intervention;  Surgeon:  AIsaias Cowman MD;  [place] [facility];  Service: Cardiovascular;  Laterality: N/A;  ? CARDIAC CATHETERIZATION N/A 04/18/2015  ? Procedure: Left Heart Cath and Coronary Angiography;  [place]

## 2021-05-08 NOTE — Assessment & Plan Note (Addendum)
continue  amlodipine, carvedilol dose increased. ?

## 2021-05-08 NOTE — Assessment & Plan Note (Addendum)
Continue statin, DAPT ? ? ?

## 2021-05-08 NOTE — Assessment & Plan Note (Addendum)
-

## 2021-05-08 NOTE — H&P (Addendum)
? ? ?HISTORY AND PHYSICAL ? ?Patient: [name] 51 y.o. female ?MRN: [id number] ? ?Today is hospital day 0 after presenting to ED on 05/08/2021 12:49 PM with  ?Chief Complaint  ?Patient presents with  ? Chest Pain  ? ? ? ?RECORD REVIEW AND HOSPITAL COURSE: ?Patient presented to the emergency department today 05/08/2021 with chief complaint of chest pain ongoing for 3 days, described as substernal pressure, she had taken nitroglycerin at home x5 doses without relief.  Reported associated shortness of breath, no nausea or vomiting, no diaphoresis.  Rating pain 10 out of 10, improved slightly to 5 out of 10 with treatment in the ED.  She has a history of coronary artery disease status post multiple cardiac catheterizations with stent placement x3.  ?ED visit 05/05/2021 for eye pain, fall.  Patient reports recent "eye surgery", nothing in hospital records other than consultation with ophthalmology at that ED visit and they recommended outpatient follow-up, she has eye patch over right eye and eyedrops which were started yesterday for infection.  History of multiple falls, patient states this has been worsening over the course the past couple months after she has been diagnosed with seizures.  She is under care of neurology.  Ad[facility] to [facility], discharged 04/03/2021 to SNF, discharged from SNF on 04/18/2021, treated for seizure disorder with Keppra and Vimpat, no seizure activity since hospital discharge.  ?Last echocardiogram on file 12/08/2020: LVEF 55 to 60%, no regional wall motion abnormalities and LV, RV function normal. ?Last left heart catheterization/coronary angiography on file 12/13/2019: LAD and first diagonal with mild to moderate in-stent restenosis, stable moderate mid RCA stenosis, stable diffuse disease in superior branch of the first diagonal, moderate mid LAD stenosis, severe progression of stenosis in mid to distal LAD, successful angioplasty and drug-eluting stent placement to the mid to  distal LAD. ?ED course: No major concerns on CBC or BMP though stable renal function consistent with CKD 3 and stable anemia with hemoglobin 11.4 were noted.  Chest x-ray showed no acute concerns.  EKG showed normal sinus rhythm.  Troponins x2 in the 30s, which is a bit increased from her typical baseline.  She was treated by EMS with aspirin.  She received morphine and Zofran in the ED. ? ?Procedures and Significant Results:  ?none ? ?Consultants:  ?Cardiology  ? ? ? ?SUBJECTIVE:  ?Seen and examined resting comfortably in emergency department bed, no acute distress.  She is alert, conversational.  She states that this episode of chest pain is very similar to the chest pain that she has had in the past prior to needing cardiac catheterization/PCI.  She reports compliance with her home medications, she notes new medications with eyedrops due to recent eye surgery treating an infection, also diagnosed that earlier this year with seizures, she reports compliance with those medications. ? ? ? ? ?ASSESSMENT & PLAN ? ?Chest pain in patient with underlying coronary artery disease ?Unstable angina vs NSTEMI ?EKG concerning for flattened/inverted T in aVL compared to last EKG 03/2021 ?Troponins slight elevated, but relatively flat in 30's ?Chest pain persistent (improved but not resolved)  ?Given high risk and complicated cardiac history: ?--> cardiology consult  ?--> initiate heparin per ACS protocol ?--> continue DAPT pending cardiology recs  ?--> keep NPO for now pending cardiology recs  ?--> has received ASA, nitroglycerin, morphine  ?--> continue statin, beta blocker. Pt is not on ACE/ARB ? ?Essential hypertension ?--> continue home meds with amlodipine, carvedilol, pending cardiology recommendations ? ?Hyperlipidemia ?-->  Continue home statin, she is on high potency statin with rosuvastatin 40 mg daily and reports compliance with this ? ?History of CVA with residual deficit ?First one at age 42. Has numbness in right  toe since her first stroke. TIA at age 14. Event in 05/2012 leaving residual left sided weakness.. Right parietal CVA on MRI 01/28/2013. ?No new neurologic deficits ?--> Continue statin, DAPT ? ? ? ?Hypertension associated with stage 3 chronic kidney disease due to type 2 diabetes mellitus (HCC) ?--> monitor BMP ruotine ? ?Type II diabetes mellitus with renal manifestations (HCC) ?--> A1C w/ am labs ?--> SSI  ? ?Seizure ([place]) ?--> Continue home medications with Keppra and Vimpat ? ? ?VTE Ppx: on heparin per ACS protocol  ?CODE STATUS: FULL ?Admitted from: home ?Expected Dispo: home pending clinical improvement ?Barriers to discharge: await cardiology consultation/recommendations, continued emdical management of possible ACS vs unstable angina  ?Family communication: pt has requested reach out to her son unless we are able to talk to him when he visits later today - if unable to see him in hospital will call him  ? ? ? ? ? ? ? ? ? ? ? ? ? ?Past Medical History:  ?Diagnosis Date  ? Acute colitis 01/27/2017  ? Acute pyelonephritis   ? Acute upper GI bleed 01/25/2020  ? Anemia   ? iron deficiency anemia  ? 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 BRCA negative 2014  ? CAD (coronary artery disease)   ? a. 08/2003 Cath: LAD 30-40-med Rx; b. 11/2014 PCI: LAD 36m(3.25x23 Xience Alpine DES); c. 06/2015 PCI: D1 (2.25x12 Resolute Integrity DES); d. 06/2017 PCI: Patent mLAD stent, D2 95 (PTCA); e. 09/2017 PCI: D2 99ost (CBA); d. 12/2017 Cath: LM nl, LAD 381m80d (small), D1 40ost, D2 95ost, LCX 40p, RCA 40ost/p->Med rx for D2 given restenosis.  ? Closed nondisplaced intertrochanteric fracture of left femur (HCAcadia9/10/2018  ? Colitis 06/03/2015  ? Colon polyp   ? CVA (cerebral vascular accident) ([facility]  ? Left side weakness.   ? Degenerative tear of glenoid labrum of right shoulder 03/15/2017  ? Diabetes mellitus without complication (HCMcChord AFB  ? Family history of breast cancer   ? BRCA neg 2014  ? Femur fracture, left (HCSanger9/10/2018  ? Gastric  ulcer 04/27/2011  ? Helicobacter pylori infection 11/23/2014  ? History of echocardiogram   ? a. 03/2017 Echo: EF 60-65%, no rwma; b. 02/2018 Echo: EF 60-65%, no rwma. Nl RV fxn. No cardiac source of emboli (admitted w/ stroke).  ? Hypertension   ? Iron deficiency anemia secondary to blood loss (chronic)   ? Malignant melanoma of skin of scalp (HCSanibel  ? MI, acute, non ST segment elevation (HCKnox  ? Neuromuscular disorder (HCConway  ? S/P drug eluting coronary stent placement 06/04/2015  ? Sepsis (HCOakbrook Terrace2/14/2019  ? Sepsis secondary to UTI (HCNelson3/22/2022  ? St[facility] ([facility]  ? a. 02/2018 MRI: 46m42mate acute/early subacute L medial frontal lobe inarct; b. 02/2018 MRA No large vessel occlusion or aneurysm. Mod to sev L P2 stenosis. thready L vertebral artery, diffusely dzs'd; c. 02/2018 Carotid U/S: <50% bilat ICA dzs.  ? Unspecified atrial fibrillation (HCCSheridan/22/2022  ? ? ?Past Surgical History:  ?Procedure Laterality Date  ? APPENDECTOMY    ? BALLOON ENTEROSCOPY  02/06/2020  ? DUM[place] BIOPSY N/A 03/14/2020  ? Procedure: BIOPSY;  Surgeon: WohLucilla LameD;  [facility] [place] [place];  Laterality:  N/A;  ? CARDIAC CATHETERIZATION N/A 11/09/2014  ? Procedure: Coronary Angiography;  Surgeon: Minna Merritts, MD;  Location: [place] CV LAB;  Service: Cardiovascular;  Laterality: N/A;  ? CARDIAC CATHETERIZATION N/A 11/12/2014  ? Procedure: Coronary Stent Intervention;  Surgeon: Isaias Cowman, MD;  Location: [place] CV LAB;  Service: Cardiovascular;  Laterality: N/A;  ? CARDIAC CATHETERIZATION N/A 04/18/2015  ? Procedure: Left Heart Cath and Coronary Angiography;  Surgeon: Minna Merritts, MD;  Location: [place] CV LAB;  Service: Cardiovascular;  Laterality: N/A;  ? CARDIAC CATHETERIZATION Left 06/04/2015  ? Procedure: Left Heart Cath and Coronary Angiography;  Surgeon: Wellington Hampshire, MD;  Location: [place] CV LAB;  Service: Cardiovascular;  Laterality: Left;  ? CARDIAC  CATHETERIZATION N/A 06/04/2015  ? Procedure: Coronary Stent Intervention;  Surgeon: Wellington Hampshire, MD;  Location: [place] CV LAB;  Service: Cardiovascular;  Laterality: N/A;  ? CESAREAN SECTION  2001  ? CHOLECYS

## 2021-05-08 NOTE — Assessment & Plan Note (Signed)
-->   Continue home statin, she is on high potency statin with rosuvastatin 40 mg daily and reports compliance with this ?

## 2021-05-08 NOTE — Assessment & Plan Note (Addendum)
Continue home medicines. ?

## 2021-05-08 NOTE — Plan of Care (Signed)

## 2021-05-08 NOTE — Consult Note (Signed)
ANTICOAGULATION CONSULT NOTE - Initial Consult ? ?Pharmacy Consult for heparin ?Indication: chest pain/ACS ? ?Allergies  ?Allergen Reactions  ? Lipitor [Atorvastatin] Other (See Comments)  ?  Leg pains  ? ? ?Patient Measurements: ?Height: '5\' 3"'  (160 cm) ?Weight: 75 kg (165 lb 4.8 oz) ?IBW/kg (Calculated) : 52.4 ?Heparin Dosing Weight: 68.3 kg ? ?Vital Signs: ?Temp: 98.1 ?F (36.7 ?C) (04/06 1259) ?BP: 165/78 (04/06 1400) ?Pulse Rate: 65 (04/06 1400) ? ?Labs: ?Recent Labs  ?  05/08/21 ?1300  ?HGB 11.4*  ?HCT 37.3  ?PLT 186  ?CREATININE 1.57*  ?TROPONINIHS 36*  ? ? ?Estimated Creatinine Clearance: 40.6 mL/min (A) (by C-G formula based on SCr of 1.57 mg/dL (H)). ? ? ?Medical History: ?Past Medical History:  ?Diagnosis Date  ? Acute colitis 01/27/2017  ? Acute pyelonephritis   ? Acute upper GI bleed 01/25/2020  ? Anemia   ? iron deficiency anemia  ? 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 BRCA negative 2014  ? CAD (coronary artery disease)   ? a. 08/2003 Cath: LAD 30-40-med Rx; b. 11/2014 PCI: LAD 4m(3.25x23 Xience Alpine DES); c. 06/2015 PCI: D1 (2.25x12 Resolute Integrity DES); d. 06/2017 PCI: Patent mLAD stent, D2 95 (PTCA); e. 09/2017 PCI: D2 99ost (CBA); d. 12/2017 Cath: LM nl, LAD 363m80d (small), D1 40ost, D2 95ost, LCX 40p, RCA 40ost/p->Med rx for D2 given restenosis.  ? Closed nondisplaced intertrochanteric fracture of left femur (HCBritton9/10/2018  ? Colitis 06/03/2015  ? Colon polyp   ? CVA (cerebral vascular accident) ([facility]  ? Left side weakness.   ? Degenerative tear of glenoid labrum of right shoulder 03/15/2017  ? Diabetes mellitus without complication (HCJefferson  ? Family history of breast cancer   ? BRCA neg 2014  ? Femur fracture, left (HCIndianola9/10/2018  ? Gastric ulcer 04/27/2011  ? Helicobacter pylori infection 11/23/2014  ? History of echocardiogram   ? a. 03/2017 Echo: EF 60-65%, no rwma; b. 02/2018 Echo: EF 60-65%, no rwma. Nl RV fxn. No cardiac source of emboli (admitted w/ stroke).  ? Hypertension   ? Iron deficiency  anemia secondary to blood loss (chronic)   ? Malignant melanoma of skin of scalp (HCSalt Creek  ? MI, acute, non ST segment elevation (HCHand  ? Neuromuscular disorder (HCBridgeton  ? S/P drug eluting coronary stent placement 06/04/2015  ? Sepsis (HCShawnee2/14/2019  ? Sepsis secondary to UTI (HCSteinauer3/22/2022  ? [facility] ([facility]  ? a. 02/2018 MRI: 65m23mate acute/early subacute L medial frontal lobe inarct; b. 02/2018 MRA No large vessel occlusion or aneurysm. Mod to sev L P2 stenosis. thready L vertebral artery, diffusely dzs'd; c. 02/2018 Carotid U/S: <50% bilat ICA dzs.  ? Unspecified atrial fibrillation (HCCDurand/22/2022  ? ? ?Medications:  ?No PTA anticoagulation, only aspirin 81 mg daily  ? ?Assessment: ?[age]o. female with a past medical history of CAD, diabetes, and HTN presenting to the emergency department for intermittent chest pain x3 days not relieved by nitroglycerin. Pharmacy has been consulted for heparin dosing for ACS.  ? ?Baseline labs: Hgb 11.4, plts 186, aPTT and INR ordered  ? ?Goal of Therapy:  ?Heparin level 0.3-0.7 units/ml ?Monitor platelets by anticoagulation protocol: Yes ?  ?Plan:  ?Give 4000 units bolus x 1 ?Start heparin infusion at 850 units/hr ?Check anti-Xa level in 6 hours and daily while on heparin ?Continue to monitor H&H and platelets ? ?MorDarnelle BosharmD ?05/08/2021,3:07 PM ? ? ?

## 2021-05-08 NOTE — ED Provider Notes (Addendum)
? ?[facility] ?Provider Note ? ? ? Event Date/Time  ? First MD Initiated Contact with Patient 05/08/21 1301   ?  (approximate) ? ?History  ? ?Chief Complaint: Chest Pain ? ?HPI ? ?[name] is a 51 y.o. female with a past medical history of CAD status post 3 stents, diabetes, hypertension, presents emergency department for chest pain.  According to the patient for the past 3 days she has been experiencing intermittent chest pain that has been constant since yesterday.  Patient states she took nitroglycerin x5 today with minimal relief of the chest pain.  Currently states 9/10 central chest pain.  States some shortness of breath at times but denies any nausea or diaphoresis. ? ?Physical Exam  ? ?Triage Vital Signs: ?ED Triage Vitals  ?Enc Vitals Group  ?   BP 05/08/21 1259 140/85  ?   Pulse Rate 05/08/21 1255 69  ?   Resp 05/08/21 1255 16  ?   Temp 05/08/21 1259 98.1 ?F (36.7 ?C)  ?   Temp src --   ?   SpO2 05/08/21 1255 100 %  ?   Weight 05/08/21 1256 165 lb 4.8 oz (75 kg)  ?   Height 05/08/21 1256 '5\' 3"'$  (1.6 m)  ?   Head Circumference --   ?   Peak Flow --   ?   Pain Score 05/08/21 1256 10  ?   Pain Loc --   ?   Pain Edu? --   ?   Excl. in [place]? --   ? ? ?Most recent vital signs: ?Vitals:  ? 05/08/21 1255 05/08/21 1259  ?BP:  140/85  ?Pulse: 69   ?Resp: 16   ?Temp:  98.1 ?F (36.7 ?C)  ?SpO2: 100%   ? ? ?General: Awake, no distress.  ?CV:  Good peripheral perfusion.  Regular rate and rhythm  ?Resp:  Normal effort.  Equal breath sounds bilaterally.  ?Abd:  No distention.  Soft, nontender.  No rebound or guarding. ? ? ?ED Results / Procedures / Treatments  ? ?EKG ? ?EKG viewed and interpreted by myself shows normal sinus rhythm at 72 bpm with a narrow QRS, normal axis, normal intervals, no concerning ST changes noted. ? ?RADIOLOGY ? ?Chest x-ray reviewed by myself does not appear to show any significant abnormality. ?Radiology is read the chest x-ray is negative ? ? ?MEDICATIONS  ORDERED IN ED: ?Medications  ?morphine (PF) 4 MG/ML injection 4 mg (has no administration in time range)  ?ondansetron (ZOFRAN) injection 4 mg (has no administration in time range)  ? ? ? ?IMPRESSION / MDM / ASSESSMENT AND PLAN / ED COURSE  ?I reviewed the triage vital signs and the nursing notes. ? ?Patient presents to the emergency department for chest pain intermittent over the past 3 days but worse over the past 24 hours and constant.  Patient took multiple rounds of nitroglycerin today with minimal improvement.  States mild shortness of breath at times none currently.  No nausea or diaphoresis.  We will treat pain, we will check labs including cardiac enzymes will likely repeat cardiac enzymes x2.  We will obtain a chest x-ray and continue to closely monitor.  EKG shows no significant or concerning change. ? ?Chest x-ray is negative.  Patient's heart enzyme has resulted somewhat elevated at 36 this is elevated compared to her historical values.  Patient continues to have chest pain.  Given patient is high risk with multiple stents in the past slightly elevated troponin  and continued chest pain we will admit to the hospital service for ongoing management and likely cardiology consultation.  Remainder the lab work is largely at baseline.  Chemistry shows renal insufficiency largely unchanged from prior.  CBC is normal. ? ?I did review the patient's last cardiology note by Dr. [name] 12/18/2020.  Patient was noted to have multiple strokes in the past, chest pain dating back to 2005 catheterization 2017 with stent.  Catheterization in 2021 with a stent placed and fairly diffuse disease present. ? ?FINAL CLINICAL IMPRESSION(S) / ED DIAGNOSES  ? ?Chest pain ? ?Note:  This document was prepared using Dragon voice recognition software and may include unintentional dictation errors. ?  ?Harvest Dark, MD ?05/08/21 1426 ? ?  ?Harvest Dark, MD ?05/08/21 1446 ? ?

## 2021-05-08 NOTE — ED Triage Notes (Signed)
[facility] from [facility] for chest pain x 3 days. Per EMS pt took 5 Nitro tablets prior to calling EMS with no relief of the pain. Pt is c/o some nausea as well. Pt was given 324 mg of ASA by EMS. Pt 12 lead EKG WNL. Pt is in NAD. ?

## 2021-05-08 NOTE — Assessment & Plan Note (Addendum)
Glucose well controlled, resume home regimen. ?

## 2021-05-09 ENCOUNTER — Other Ambulatory Visit: Payer: Self-pay

## 2021-05-09 ENCOUNTER — Inpatient Hospital Stay (HOSPITAL_COMMUNITY): Payer: Medicare Other

## 2021-05-09 DIAGNOSIS — I25118 Atherosclerotic heart disease of native coronary artery with other forms of angina pectoris: Secondary | ICD-10-CM

## 2021-05-09 DIAGNOSIS — R079 Chest pain, unspecified: Secondary | ICD-10-CM

## 2021-05-09 DIAGNOSIS — N1832 Chronic kidney disease, stage 3b: Secondary | ICD-10-CM

## 2021-05-09 LAB — NM MYOCAR MULTI W/SPECT W/WALL MOTION / EF
LV dias vol: 67 mL (ref 46–106)
LV sys vol: 35 mL
Nuc Stress EF: 48 %
Peak HR: 111 {beats}/min
Percent HR: 66 %
Rest HR: 72 {beats}/min
Rest Nuclear Isotope Dose: 10.4 mCi
SDS: 3
SRS: 11
SSS: 11
ST Depression (mm): 0 mm
Stress Nuclear Isotope Dose: 32.2 mCi
TID: 1.54

## 2021-05-09 LAB — GLUCOSE, CAPILLARY
Glucose-Capillary: 95 mg/dL (ref 70–99)
Glucose-Capillary: 95 mg/dL (ref 70–99)
Glucose-Capillary: 340 mg/dL — ABNORMAL HIGH (ref 70–99)
Glucose-Capillary: 154 mg/dL — ABNORMAL HIGH (ref 70–99)

## 2021-05-09 LAB — HIV ANTIBODY (ROUTINE TESTING W REFLEX): HIV Screen 4th Generation wRfx: NONREACTIVE

## 2021-05-09 LAB — HEPARIN LEVEL (UNFRACTIONATED)
Heparin Unfractionated: 0.25 IU/mL — ABNORMAL LOW (ref 0.30–0.70)
Heparin Unfractionated: 0.39 IU/mL (ref 0.30–0.70)
Heparin Unfractionated: 0.25 IU/mL — ABNORMAL LOW (ref 0.30–0.70)

## 2021-05-09 MED ORDER — SODIUM CHLORIDE 0.9 % IV SOLN: INTRAVENOUS | Status: DC

## 2021-05-09 MED ORDER — HEPARIN BOLUS VIA INFUSION
1000.0000 [IU] | Freq: Once | INTRAVENOUS | Status: AC
  Administered 2021-05-09: 1000 [IU] via INTRAVENOUS
  Filled 2021-05-09: qty 1000

## 2021-05-09 MED ORDER — REGADENOSON 0.4 MG/5ML IV SOLN
0.4000 mg | Freq: Once | INTRAVENOUS | Status: AC
  Administered 2021-05-09: 0.4 mg via INTRAVENOUS
  Filled 2021-05-09: qty 5

## 2021-05-09 MED ORDER — TECHNETIUM TC 99M TETROFOSMIN IV KIT
32.2400 | PACK | Freq: Once | INTRAVENOUS | Status: AC | PRN
  Administered 2021-05-09: 32.24 via INTRAVENOUS

## 2021-05-09 MED ORDER — TECHNETIUM TC 99M TETROFOSMIN IV KIT
10.0000 | PACK | Freq: Once | INTRAVENOUS | Status: AC | PRN
  Administered 2021-05-09: 10.36 via INTRAVENOUS

## 2021-05-09 NOTE — Assessment & Plan Note (Addendum)
Patient had a slightly worsening renal function before the heart cath.  Renal function better after received gentle rehydration ?

## 2021-05-09 NOTE — Progress Notes (Signed)
Patient with noted abnormal Lexiscan MPI with images concerning for inferior wall ischemia.  We will plan for LHC on 05/12/2021.  We will gently hydrate the patient with IV fluids at 75 mL/h beginning at 1900 on 4/9.  Orders for IV fluids have been signed.  Cath orders have been signed and held. ?

## 2021-05-09 NOTE — TOC Initial Note (Signed)
Transition of Care (TOC) - Initial/Assessment Note  ? ? ?Patient Details  ?Name: [facility] ?MRN: [id number] ?Date of Birth: November 11, 1970 ? ?Transition of Care (TOC) CM/SW Contact:    ?Laurena Slimmer, RN ?Phone Number: ?05/09/2021, 2:21 PM ? ?Clinical Narrative:                 ?Patient request to return home with home health via [facility]. Currently active with PT/ OT. Requesting SW. Son will transport home and to appointment. Spoke with Merleen Nicely at [facility]. Stated she would add on SW. Pharmacy is [facility]. Has walker and WC. Will continue to reassess for further needs. ? ?Expected Discharge Plan: [place] ?Barriers to Discharge: Continued Medical Work up ? ? ?Patient Goals and CMS Choice ?  ?  ?  ? ?Expected Discharge Plan and Services ?Expected Discharge Plan: [place] ?  ?Discharge Planning Services: CM Consult ?Post Acute Care Choice: Resumption of Svcs/PTA Provider (PT/OT) ?Living arrangements for the past 2 months: [place] ?                ?  ?  ?  ?  ?  ?  ?  ?  ?  ?  ? ?Prior Living Arrangements/Services ?Living arrangements for the past 2 months: [place] ?  ?Patient language and need for interpreter reviewed:: Yes ?Do you feel safe going back to the place where you live?: Yes      ?Need for Family Participation in Patient Care: Yes (Comment) ?Care giver support system in place?: Yes (comment) ?Current home services: Home PT, Home OT ?Criminal Activity/Legal Involvement Pertinent to Current Situation/Hospitalization: No - Comment as needed ? ?Activities of Daily Living ?Home Assistive Devices/Equipment: Eyeglasses, Wheelchair, Shower chair with back, Environmental consultant (specify type) ?ADL Screening (condition at time of admission) ?Patient's cognitive ability adequate to safely complete daily activities?: Yes ?Is the patient deaf or have difficulty hearing?: No ?Does the patient have difficulty seeing, even when wearing glasses/contacts?: Yes ?Does  the patient have difficulty concentrating, remembering, or making decisions?: No ?Patient able to express need for assistance with ADLs?: Yes ?Does the patient have difficulty dressing or bathing?: Yes ?Independently performs ADLs?: No ?Communication: Independent ?Dressing (OT): Needs assistance ?Is this a change from baseline?: Pre-admission baseline ?Grooming: Appropriate for developmental age, Needs assistance ?Is this a change from baseline?: Pre-admission baseline ?Feeding: Independent ?Bathing: Needs assistance ?Is this a change from baseline?: Pre-admission baseline ?Toileting: Needs assistance ?Is this a change from baseline?: Pre-admission baseline ?In/Out Bed: Needs assistance ?Is this a change from baseline?: Pre-admission baseline ?Walks in Home: Needs assistance ?Is this a change from baseline?: Pre-admission baseline ?Does the patient have difficulty walking or climbing stairs?: Yes ?Weakness of Legs: Both ?Weakness of Arms/Hands: Both ? ?Permission Sought/Granted ?  ?  ?   ?   ?   ?   ? ?Emotional Assessment ?Appearance:: Appears stated age ?Attitude/Demeanor/Rapport: Engaged, Gracious ?Affect (typically observed): Accepting ?Orientation: : Oriented to Self, Oriented to Place, Oriented to  Time, Oriented to Situation ?Alcohol / Substance Use: Not Applicable ?Psych Involvement: No (comment) ? ?Admission diagnosis:  Chest pain [R07.9] ?Chest pain, unspecified type [R07.9] ?Patient Active Problem List  ? Diagnosis Date Noted  ? Chest pain in patient with underlying coronary artery disease 05/08/2021  ? Cerebrovascular accident (CVA) ([place]) 04/24/2021  ? Generalized idiopathic epilepsy and epileptic syndromes, not intractable, with status epilepticus ([place]) 04/24/2021  ? Gait abnormality 04/24/2021  ?  Hypertensive emergency   ? Urinary tract infection without hematuria   ? Hypertension associated with diabetes ([place])   ? Malnutrition of moderate degree 03/10/2021  ? Seizure ([place])   ? Altered mental status  02/08/2021  ? Seizure disorder ([place]) 02/08/2021  ? Acute focal neurological deficit 02/07/2021  ? Chronic kidney disease, stage III (moderate) (HCC)   ? Neurogenic bladder   ? Intermittent self-catheterization of bladder   ? History of ESBL E. coli infection   ? Memory loss 01/17/2021  ? Chronic migraine w/o aura w/o status migrainosus, not intractable 01/17/2021  ? Major depressive disorder, recurrent episode, moderate ([place]) 12/08/2020  ? Family history of malignant neoplasm of gastrointestinal tract   ? Near syncope 01/25/2020  ? Pulmonary embolism ([place]) 11/07/2019  ? Type II diabetes mellitus with renal manifestations ([place]) 11/05/2019  ? Family history of breast cancer 05/23/2019  ? Family history of colon cancer 05/23/2019  ? Retinopathy 05/12/2019  ? Abnormal LFTs 04/18/2019  ? Esophageal dysphagia   ? Gastrointestinal tract imaging abnormality   ? Malignant neoplasm of vulva, unspecified ([place]) 03/10/2019  ? Peripheral vascular disease, unspecified ([place]) 03/10/2019  ? Paroxysmal supraventricular tachycardia ([place]) 03/10/2019  ? Autonomic neuropathy 03/24/2018  ? H/O gastric bypass 03/02/2018  ? Hypertension associated with stage 3 chronic kidney disease due to type 2 diabetes mellitus ([place]) 02/20/2018  ? Insomnia 03/18/2017  ? Ischemic cardiomyopathy   ? Arthritis   ? Anxiety   ? Hx of colonic polyps   ? CAD (coronary artery disease)   ? Hypertensive heart disease   ? Status post bariatric surgery 06/04/2015  ? Carotid stenosis 04/30/2015  ? Iron deficiency anemia 03/22/2015  ? Vitamin B12 deficiency 02/18/2015  ? Malignant melanoma ([place]) 08/25/2014  ? Chronic systolic CHF (congestive heart failure) ([place])   ? Incomplete bladder emptying 07/12/2014  ? Hypothyroidism 12/30/2013  ? Aberrant subclavian artery 11/17/2013  ? Multiple sclerosis ([place]) 11/02/2013  ? History of CVA with residual deficit 06/20/2013  ? Headache, migraine 05/29/2013  ? Hyperlipidemia   ? GERD (gastroesophageal reflux disease)   ?  Demyelinating disease ([place]) 01/02/2011  ? Depression with anxiety 05/01/2008  ? Essential hypertension 05/01/2008  ? ?PCP:  [name], MD ?Pharmacy:   ?[place] [place] [address] [address] ?Phone: [phone] Fax: [phone] ? ?[place] ([place] ?[address] ([place]) [place] [address] ?Phone: [phone] Fax: [phone] ? ? ? ? ?Social Determinants of Health (SDOH) Interventions ?  ? ?Readmission Risk Interventions ? ?  05/09/2021  ?  2:17 PM 11/07/2019  ?  4:28 PM 10/18/2019  ?  1:55 PM  ?Readmission Risk Prevention Plan  ?Transportation Screening Complete Complete Complete  ?Medication Review Press photographer) Complete Complete Complete  ?PCP or Specialist appointment within 3-5 days of discharge   Complete  ?[place] or Home Care Consult Complete Complete Complete  ?SW Recovery Care/Counseling Consult   Complete  ?Palliative Care Screening Not Applicable Not Applicable Not Applicable  ?[place] Not Applicable Not Applicable Not Applicable  ? ? ? ?

## 2021-05-09 NOTE — Hospital Course (Addendum)
[name] is a 51 year old female with history of coronary disease status post PCI, chronic congestive heart failure with normalized ejection fraction, chronic kidney disease stage IIIa, history of GI bleed, history of CVA, aortic arch aneurysm, type 2 diabetes, obesity status post gastric bypass surgery, bipolar disorder, essential hypertension, dyslipidemia, migraine headache who present to the hospital with chest pain. ?She had multiple falls for the last 1-month she also had a eye infection which she had a surgery 2 days ago.  Still taking antibiotics. ?Upon arriving the hospital, she has a peak troponin of 36. ? ?05/09/2021.  Nuclear stress test showed reverse ischemia.  Scheduled for heart cath on Monday. ?4/8.  Patient had severe chest pain in the evening.  Placed on nitro drip, continued on heparin.  CT angiogram ruled out aortic dissection. ? ?

## 2021-05-09 NOTE — Progress Notes (Signed)
1942: This RN enters patient's room to round and patient endorses 8/10 chest pain. Sublingual nitro given. Patient states no change in pain, second dose of nitro given. Patient states pain now 7/10.  ?2055: Patient calls RN to room, chest pain now 10/10. Patient grabbing at chest and sitting up in bed. PRN morphine given. As morphine is being administered, patient states "it's getting worse". After morphine dose, patient states she's nauseous- PRN zofran given. See MAR for all dose times.  ?2200: All nightly meds given by charge nurse. This RN enters room to assess patient, she is sleeping comfortably at this time.  ?

## 2021-05-09 NOTE — Consult Note (Signed)
ANTICOAGULATION CONSULT NOTE  ?Pharmacy Consult for heparin ?Indication: chest pain/ACS ? ?Allergies  ?Allergen Reactions  ? Desyrel [Trazodone] Other (See Comments)  ?  Pt states this medication brings on seizures   ? Lipitor [Atorvastatin] Other (See Comments)  ?  Leg pains  ? ? ?Patient Measurements: ?Height: '5\' 3"'  (160 cm) ?Weight: 75 kg (165 lb 4.8 oz) ?IBW/kg (Calculated) : 52.4 ?Heparin Dosing Weight: 68.3 kg ? ?Vital Signs: ?Temp: 97.9 ?F (36.6 ?C) (04/07 0825) ?BP: 156/74 (04/07 0825) ?Pulse Rate: 63 (04/07 0825) ? ?Labs: ?Recent Labs  ?  05/08/21 ?1300 05/08/21 ?1501 05/08/21 ?2254 05/09/21 ?8242 05/09/21 ?1423  ?HGB 11.4*  --   --   --   --   ?HCT 37.3  --   --   --   --   ?PLT 186  --   --   --   --   ?APTT 38*  --   --   --   --   ?LABPROT 13.2  --   --   --   --   ?INR 1.0  --   --   --   --   ?HEPARINUNFRC  --   --  0.23* 0.25* 0.39  ?CREATININE 1.57*  --   --   --   --   ?TROPONINIHS 36* 35*  --   --   --   ? ? ? ?Estimated Creatinine Clearance: 40.6 mL/min (A) (by C-G formula based on SCr of 1.57 mg/dL (H)). ? ? ?Medical History: ?Past Medical History:  ?Diagnosis Date  ? Acute colitis 01/27/2017  ? Acute pyelonephritis   ? Acute upper GI bleed 01/25/2020  ? Anemia   ? iron deficiency anemia  ? Aortic arch aneurysm ([place])   ? BRCA negative 2014  ? CAD (coronary artery disease)   ? a. 08/2003 Cath: LAD 30-40-med Rx; b. 11/2014 PCI: LAD 50m(3.25x23 Xience Alpine DES); c. 06/2015 PCI: D1 (2.25x12 Resolute Integrity DES); d. 06/2017 PCI: Patent mLAD stent, D2 95 (PTCA); e. 09/2017 PCI: D2 99ost (CBA); d. 12/2017 Cath: LM nl, LAD 349m80d (small), D1 40ost, D2 95ost, LCX 40p, RCA 40ost/p->Med rx for D2 given restenosis.  ? Closed nondisplaced intertrochanteric fracture of left femur (HCCarter9/10/2018  ? Colitis 06/03/2015  ? Colon polyp   ? CVA (cerebral vascular accident) ([facility]  ? Left side weakness.   ? Degenerative tear of glenoid labrum of right shoulder 03/15/2017  ? Diabetes mellitus without complication  (HCSavoonga  ? Family history of breast cancer   ? BRCA neg 2014  ? Femur fracture, left (HCOld [place]9/10/2018  ? Gastric ulcer 04/27/2011  ? Helicobacter pylori infection 11/23/2014  ? History of echocardiogram   ? a. 03/2017 Echo: EF 60-65%, no rwma; b. 02/2018 Echo: EF 60-65%, no rwma. Nl RV fxn. No cardiac source of emboli (admitted w/ stroke).  ? Hypertension   ? Iron deficiency anemia secondary to blood loss (chronic)   ? Malignant melanoma of skin of scalp (HCAdin  ? MI, acute, non ST segment elevation (HCMantorville  ? Neuromuscular disorder (HCPenns[place]  ? S/P drug eluting coronary stent placement 06/04/2015  ? Sepsis (HCNoblestown2/14/2019  ? Sepsis secondary to UTI (HCSalida3/22/2022  ? [facility] ([facility]  ? a. 02/2018 MRI: 48m28mate acute/early subacute L medial frontal lobe inarct; b. 02/2018 MRA No large vessel occlusion or aneurysm. Mod to sev L P2 stenosis. thready L vertebral artery, diffusely dzs'd; c. 02/2018 Carotid U/S: <50%  bilat ICA dzs.  ? Unspecified atrial fibrillation ([place]) 04/23/2020  ? ? ?Medications:  ?No PTA anticoagulation, only aspirin 81 mg daily  ? ?Assessment: ?51 y.o. female with a past medical history of CAD, diabetes, and HTN presenting to the emergency department for intermittent chest pain x3 days not relieved by nitroglycerin. Pharmacy has been consulted for heparin dosing for ACS.  ? ?Baseline labs: Hgb 11.4, plts 186, aPTT and INR ordered  ? ?Goal of Therapy:  ?Heparin level 0.3-0.7 units/ml ?Monitor platelets by anticoagulation protocol: Yes ? ?4/06 2254 HL 0.23, subtherapeutic ?4/07 0536 HL 0.25, subtherapeutic ?4/07 1423 HL 0.39, therapeutic x 1  @ 1150 units/hr ?  ?Plan:  ?Continue heparin infusion to 1150 units/hr ?Recheck HL in 6 hr to confirm rate ?CBC daily while on heparin. ? ?Taveon Enyeart Rodriguez-Guzman PharmD, BCPS ?05/09/2021 4:11 PM ? ? ?

## 2021-05-09 NOTE — Consult Note (Signed)
ANTICOAGULATION CONSULT NOTE  ?Pharmacy Consult for heparin ?Indication: chest pain/ACS ? ?Allergies  ?Allergen Reactions  ? Desyrel [Trazodone] Other (See Comments)  ?  Pt states this medication brings on seizures   ? Lipitor [Atorvastatin] Other (See Comments)  ?  Leg pains  ? ? ?Patient Measurements: ?Height: '5\' 3"'  (160 cm) ?Weight: 75 kg (165 lb 4.8 oz) ?IBW/kg (Calculated) : 52.4 ?Heparin Dosing Weight: 68.3 kg ? ?Vital Signs: ?Temp: 98.6 ?F (37 ?C) (04/07 0335) ?Temp Source: Oral (04/06 1956) ?BP: 162/73 (04/07 0335) ?Pulse Rate: 64 (04/07 0335) ? ?Labs: ?Recent Labs  ?  05/08/21 ?1300 05/08/21 ?1501 05/08/21 ?2254 05/09/21 ?0960  ?HGB 11.4*  --   --   --   ?HCT 37.3  --   --   --   ?PLT 186  --   --   --   ?APTT 38*  --   --   --   ?LABPROT 13.2  --   --   --   ?INR 1.0  --   --   --   ?HEPARINUNFRC  --   --  0.23* 0.25*  ?CREATININE 1.57*  --   --   --   ?TROPONINIHS 36* 35*  --   --   ? ? ? ?Estimated Creatinine Clearance: 40.6 mL/min (A) (by C-G formula based on SCr of 1.57 mg/dL (H)). ? ? ?Medical History: ?Past Medical History:  ?Diagnosis Date  ? Acute colitis 01/27/2017  ? Acute pyelonephritis   ? Acute upper GI bleed 01/25/2020  ? Anemia   ? iron deficiency anemia  ? Aortic arch aneurysm ([place])   ? BRCA negative 2014  ? CAD (coronary artery disease)   ? a. 08/2003 Cath: LAD 30-40-med Rx; b. 11/2014 PCI: LAD 106m(3.25x23 Xience Alpine DES); c. 06/2015 PCI: D1 (2.25x12 Resolute Integrity DES); d. 06/2017 PCI: Patent mLAD stent, D2 95 (PTCA); e. 09/2017 PCI: D2 99ost (CBA); d. 12/2017 Cath: LM nl, LAD 366m80d (small), D1 40ost, D2 95ost, LCX 40p, RCA 40ost/p->Med rx for D2 given restenosis.  ? Closed nondisplaced intertrochanteric fracture of left femur (HCSumner9/10/2018  ? Colitis 06/03/2015  ? Colon polyp   ? CVA (cerebral vascular accident) ([facility]  ? Left side weakness.   ? Degenerative tear of glenoid labrum of right shoulder 03/15/2017  ? Diabetes mellitus without complication (HCCres[place]  ? Family history of  breast cancer   ? BRCA neg 2014  ? Femur fracture, left (HCEl Valle de [place]royo [place]9/10/2018  ? Gastric ulcer 04/27/2011  ? Helicobacter pylori infection 11/23/2014  ? History of echocardiogram   ? a. 03/2017 Echo: EF 60-65%, no rwma; b. 02/2018 Echo: EF 60-65%, no rwma. Nl RV fxn. No cardiac source of emboli (admitted w/ stroke).  ? Hypertension   ? Iron deficiency anemia secondary to blood loss (chronic)   ? Malignant melanoma of skin of scalp (HCCalverton  ? MI, acute, non ST segment elevation (HCWest Falls Church  ? Neuromuscular disorder (HCMooreton  ? S/P drug eluting coronary stent placement 06/04/2015  ? Sepsis (HCDownieville2/14/2019  ? Sepsis secondary to UTI (HCSp[place]3/22/2022  ? St[facility] ([facility]  ? a. 02/2018 MRI: 67m63mate acute/early subacute L medial frontal lobe inarct; b. 02/2018 MRA No large vessel occlusion or aneurysm. Mod to sev L P2 stenosis. thready L vertebral artery, diffusely dzs'd; c. 02/2018 Carotid U/S: <50% bilat ICA dzs.  ? Unspecified atrial fibrillation (HCCHosmer/22/2022  ? ? ?Medications:  ?No PTA anticoagulation, only aspirin 81 mg  daily  ? ?Assessment: ?51 y.o. female with a past medical history of CAD, diabetes, and HTN presenting to the emergency department for intermittent chest pain x3 days not relieved by nitroglycerin. Pharmacy has been consulted for heparin dosing for ACS.  ? ?Baseline labs: Hgb 11.4, plts 186, aPTT and INR ordered  ? ?Goal of Therapy:  ?Heparin level 0.3-0.7 units/ml ?Monitor platelets by anticoagulation protocol: Yes ? ?4/06 2254 HL 0.23, subtherapeutic ?4/07 0536 HL 0.25, subtherapeutic ?  ?Plan:  ?Bolus 1000 units x 1 ?Increase heparin infusion to 1150 units/hr ?Recheck HL in ~ 6 hr after rate change ?CBC daily while on heparin. ? ?Renda Rolls, PharmD, MBA ?05/09/2021 ?6:56 AM ? ? ? ?

## 2021-05-09 NOTE — Assessment & Plan Note (Deleted)
BMI 40.6.  Diet exercise advised ?

## 2021-05-09 NOTE — Progress Notes (Signed)
? ? ?Progress Note ? ?Patient Name: [name] ?Date of Encounter: 05/09/2021 ? ?Primary Cardiologist: [name] ? ?Subjective  ? ?Had an episode of sharp central chest pain that radiated to her throat last night with associated nausea. She received 2 SL NTG with brief improvement as well as morphine with resolution of pain. Currently pain free. She is for [facility] MPI today.  ? ?Inpatient Medications  ?  ?Scheduled Meds: ? ALPRAZolam  1 mg Oral BID  ? amLODipine  10 mg Oral Daily  ? aspirin EC  81 mg Oral Daily  ? carvedilol  6.25 mg Oral BID  ? clopidogrel  75 mg Oral Daily  ? gabapentin  600 mg Oral BID  ? insulin aspart  0-9 Units Subcutaneous Q4H  ? isosorbide mononitrate  120 mg Oral Daily  ? lacosamide  200 mg Oral BID  ? levETIRAcetam  1,000 mg Oral BID  ? levothyroxine  25 mcg Oral QAC breakfast  ? mirabegron ER  50 mg Oral Daily  ? mirtazapine  7.5 mg Oral QHS  ? ofloxacin  1 drop Right Eye QID  ? pantoprazole  40 mg Oral BID  ? prednisoLONE acetate  1 drop Right Eye QID  ? rosuvastatin  40 mg Oral q1800  ? venlafaxine XR  75 mg Oral Q breakfast  ? ?Continuous Infusions: ? heparin 1,150 Units/hr (05/09/21 0801)  ? ?PRN Meds: ?acetaminophen, baclofen, diclofenac Sodium, morphine injection, nitroGLYCERIN, ondansetron (ZOFRAN) IV, polyvinyl alcohol, Rimegepant Sulfate  ? ?Vital Signs  ?  ?Vitals:  ? 05/08/21 2000 05/08/21 2022 05/08/21 2306 05/09/21 0335  ?BP: (!) 147/76 (!) 174/77 (!) 153/81 (!) 162/73  ?Pulse: 67 73 66 64  ?Resp:   19 18  ?Temp:   97.6 ?F (36.4 ?C) 98.6 ?F (37 ?C)  ?TempSrc:      ?SpO2: 98%  95% 98%  ?Weight:      ?Height:      ? ? ?Intake/Output Summary (Last 24 hours) at 05/09/2021 0814 ?Last data filed at 05/09/2021 0500 ?Gross per 24 hour  ?Intake 427.37 ml  ?Output 800 ml  ?Net -372.63 ml  ? ?Filed Weights  ? 05/08/21 1256  ?Weight: 75 kg  ? ? ?Telemetry  ?  ?SR - Personally Reviewed ? ?ECG  ?  ?No new tracings - Personally Reviewed ? ?Physical Exam  ? ?GEN: No acute distress.    ?Neck: No JVD. ?Cardiac: RRR, no murmurs, rubs, or gallops.  ?Respiratory: Clear to auscultation bilaterally.  ?GI: Soft, nontender, non-distended.   ?MS: No edema; No deformity. ?Neuro:  Alert and oriented x 3; Nonfocal.  ?Psych: Normal affect. ? ?Labs  ?  ?Chemistry ?Recent Labs  ?Lab 05/08/21 ?1300  ?NA 140  ?K 4.7  ?CL 106  ?CO2 27  ?GLUCOSE 119*  ?BUN 23*  ?CREATININE 1.57*  ?CALCIUM 8.8*  ?GFRNONAA 39*  ?ANIONGAP 7  ?  ? ?Hematology ?Recent Labs  ?Lab 05/08/21 ?1300  ?WBC 8.2  ?RBC 4.40  ?HGB 11.4*  ?HCT 37.3  ?MCV 84.8  ?MCH 25.9*  ?MCHC 30.6  ?RDW 13.6  ?PLT 186  ? ? ?Cardiac EnzymesNo results for input(s): TROPONINI in the last 168 hours. No results for input(s): TROPIPOC in the last 168 hours.  ? ?BNPNo results for input(s): BNP, PROBNP in the last 168 hours.  ? ?DDimer No results for input(s): DDIMER in the last 168 hours.  ? ?Radiology  ?  ?DG Chest 2 View ? ?Result Date: 05/08/2021 ?IMPRESSION: No evidence of acute  cardiopulmonary disease. Electronically Signed   By: Maurine Simmering M.D.   On: 05/08/2021 13:22   ? ?Cardiac Studies  ? ?See cardiology consult note. ? ?Patient Profile  ?   ?51 y.o. female with history of CAD status post prior PCI as outlined below with chronic angina, HFrEF secondary to ICM with subsequent normalization of LV systolic function in 4081, CKD stage III, GI bleed in 01/2020 requiring "10 units" of pRBC, CVAs, left carotid to subclavian artery bypass with subclavian artery ligation, aortic arch aneurysm, anemia, obesity status post gastric bypass, DM with polyneuropathy and history of hypoglycemia, bipolar disorder, migraine disorder, HTN, HLD, frequent falls/syncope/near syncope, and recent eye infection s/p surgery on 05/07/2021 who is being seen today for the evaluation of chest pain at the request of Dr. Sheppard Coil. ? ?Assessment & Plan  ?  ?1. CAD involving the native coronary arteries with chronic chest pain and mildly elevated high sensitivity troponin: ?-Pain was somewhat  reproducible to her palpation in the ED and was worsened when drinking sweet tea at [employer] ?-Pain did not improve with SL NTG x5 at home prior to arrival ?-Cannot exclude musculoskeletal or GI etiology at this time ?-Mildly elevated and flat trending troponin ?-Remains on heparin gtt, pending Lexiscan  ?-NPO  ?-Plan for Lexiscan MPI today to evaluate for high risk ischemia, if unrevealing, no plans for further inpatient cardiac evaluation ?-PTA ASA, Plavix, amlodipine, carvedilol, Imdur, and rosuvastatin ?  ?2. Frequent falls: ?-She fell 4 times prior to admission and reports lower extremity weakness ?-Possibly in the setting of her underlying vision issues  ?-May benefit from PT/OT at the discretion of IM ?  ?3. CKD stage II: ?-Renal function stable ?-Avoid nephrotoxic medications/substances ?  ?4. HTN: ?-Blood pressure mildly elevated  ?-PTA carvedilol, Imdur, amlodipine ?-Escalate hypertensive therapy as indicated post Lexiscan MPI ?  ?5. HLD: ?-LDL 95 in 02/2021 ?-PTA rosuvastatin ?-Consider addition of ezetimibe in the office ?  ?6. Anemia with history of GI bleed: ?-HGB appears stable ? ? ?Shared Decision Making/Informed Consent{ ? ?The risks [chest pain, shortness of breath, cardiac arrhythmias, dizziness, blood pressure fluctuations, myocardial infarction, stroke/transient ischemic attack, nausea, vomiting, allergic reaction, radiation exposure, metallic taste sensation and life-threatening complications (estimated to be 1 in 10,000)], benefits (risk stratification, diagnosing coronary artery disease, treatment guidance) and alternatives of a nuclear stress test were discussed in detail with Ms. Florin and she agrees to proceed.  ? ?For questions or updates, please contact [place] ?Please consult [email or web address] for contact info under Cardiology/STEMI. ?  ? ?Signed, ?Christell Faith, PA-C ?[facility] ?Pager: [phone] ?05/09/2021, 8:14 AM ? ?

## 2021-05-09 NOTE — Consult Note (Signed)
ANTICOAGULATION CONSULT NOTE  ?Pharmacy Consult for heparin ?Indication: chest pain/ACS ? ?Allergies  ?Allergen Reactions  ? Desyrel [Trazodone] Other (See Comments)  ?  Pt states this medication brings on seizures   ? Lipitor [Atorvastatin] Other (See Comments)  ?  Leg pains  ? ? ?Patient Measurements: ?Height: '5\' 3"'  (160 cm) ?Weight: 75 kg (165 lb 4.8 oz) ?IBW/kg (Calculated) : 52.4 ?Heparin Dosing Weight: 68.3 kg ? ?Vital Signs: ?Temp: 98.1 ?F (36.7 ?C) (04/07 1600) ?Temp Source: Oral (04/07 1600) ?BP: 160/109 (04/07 2100) ?Pulse Rate: 100 (04/07 2100) ? ?Labs: ?Recent Labs  ?  05/08/21 ?1300 05/08/21 ?1501 05/08/21 ?2254 05/09/21 ?5993 05/09/21 ?1423 05/09/21 ?1952  ?HGB 11.4*  --   --   --   --   --   ?HCT 37.3  --   --   --   --   --   ?PLT 186  --   --   --   --   --   ?APTT 38*  --   --   --   --   --   ?LABPROT 13.2  --   --   --   --   --   ?INR 1.0  --   --   --   --   --   ?HEPARINUNFRC  --   --    < > 0.25* 0.39 0.25*  ?CREATININE 1.57*  --   --   --   --   --   ?TROPONINIHS 36* 35*  --   --   --   --   ? < > = values in this interval not displayed.  ? ? ? ?Estimated Creatinine Clearance: 40.6 mL/min (A) (by C-G formula based on SCr of 1.57 mg/dL (H)). ? ? ?Medical History: ?Past Medical History:  ?Diagnosis Date  ? Acute colitis 01/27/2017  ? Acute pyelonephritis   ? Acute upper GI bleed 01/25/2020  ? Anemia   ? iron deficiency anemia  ? Aortic arch aneurysm ([place])   ? BRCA negative 2014  ? CAD (coronary artery disease)   ? a. 08/2003 Cath: LAD 30-40-med Rx; b. 11/2014 PCI: LAD 62m(3.25x23 Xience Alpine DES); c. 06/2015 PCI: D1 (2.25x12 Resolute Integrity DES); d. 06/2017 PCI: Patent mLAD stent, D2 95 (PTCA); e. 09/2017 PCI: D2 99ost (CBA); d. 12/2017 Cath: LM nl, LAD 32m80d (small), D1 40ost, D2 95ost, LCX 40p, RCA 40ost/p->Med rx for D2 given restenosis.  ? Closed nondisplaced intertrochanteric fracture of left femur (HCGrenelefe9/10/2018  ? Colitis 06/03/2015  ? Colon polyp   ? CVA (cerebral vascular accident)  ([facility]  ? Left side weakness.   ? Degenerative tear of glenoid labrum of right shoulder 03/15/2017  ? Diabetes mellitus without complication (HCWrangell  ? Family history of breast cancer   ? BRCA neg 2014  ? Femur fracture, left (HCNichols9/10/2018  ? Gastric ulcer 04/27/2011  ? Helicobacter pylori infection 11/23/2014  ? History of echocardiogram   ? a. 03/2017 Echo: EF 60-65%, no rwma; b. 02/2018 Echo: EF 60-65%, no rwma. Nl RV fxn. No cardiac source of emboli (admitted w/ stroke).  ? Hypertension   ? Iron deficiency anemia secondary to blood loss (chronic)   ? Malignant melanoma of skin of scalp (HCVega Alta  ? MI, acute, non ST segment elevation (HCDeer [place]  ? Neuromuscular disorder (HCSalamonia  ? S/P drug eluting coronary stent placement 06/04/2015  ? Sepsis (HCEwing2/14/2019  ? Sepsis secondary to UTI (HCRock[place]  04/23/2020  ? [facility])   ? a. 02/2018 MRI: 38m late acute/early subacute L medial frontal lobe inarct; b. 02/2018 MRA No large vessel occlusion or aneurysm. Mod to sev L P2 stenosis. thready L vertebral artery, diffusely dzs'd; c. 02/2018 Carotid U/S: <50% bilat ICA dzs.  ? Unspecified atrial fibrillation (HWhiting 04/23/2020  ?Heparin Dosing Weight: 68.3 kg ? ?Medications:  ?No PTA anticoagulation, only aspirin 81 mg daily  ? ?Assessment: ?51y.o. female with a past medical history of CAD, diabetes, and HTN presenting to the emergency department for intermittent chest pain x3 days not relieved by nitroglycerin. Pharmacy has been consulted for heparin dosing for ACS.  ? ?Baseline labs: Hgb 11.4, plts 186, aPTT and INR ordered  ? ?Goal of Therapy:  ?Heparin level 0.3-0.7 units/ml ?Monitor platelets by anticoagulation protocol: Yes ? ?4/06 2254 HL 0.23, subtherapeutic ?4/07 0536 HL 0.25, subtherapeutic ?4/07 1423 HL 0.39, therapeutic x 1  @ 1150 units/hr ?4/07 1952 HL 0.25, sutherapeutic 1150 > 1300 un/hr ? ?Hgb 13.2; Plts 186 ?BL aPTT 38s ? ?Plan:  ?Heparin level slightly subtherapeutic. ?Bolus 1000 units x1; then increase heparin infusion to  1300 units/hr ?Recheck HL in 6 hr to confirm rate ?CBC daily while on heparin. ? ?BLorna Dibble PharmD, BCCP ?Clinical Pharmacist ?05/09/2021 9:10 PM ? ? ? ?

## 2021-05-09 NOTE — Progress Notes (Signed)
?  Progress Note ? ? ?Patient: [name] PXT:[id number] DOB: Jun 22, 1970 DOA: 05/08/2021     1 ?DOS: the patient was seen and examined on 05/09/2021 ?  ?Brief hospital course: ?[name] is a 51 year old female with history of coronary disease status post PCI, chronic congestive heart failure with normalized ejection fraction, chronic kidney disease stage IIIa, history of GI bleed, history of CVA, aortic arch aneurysm, type 2 diabetes, obesity status post gastric bypass surgery, bipolar disorder, essential hypertension, dyslipidemia, migraine headache who present to the hospital with chest pain. ?She had multiple falls for the last 49-month she also had a eye infection which she had a surgery 2 days ago.  Still taking antibiotics. ?Upon arriving the hospital, she has a peak troponin of 36. ? ?05/09/2021.  Nuclear stress test showed reverse ischemia.  Scheduled for heart cath on Monday. ? ?Assessment and Plan: ?Obesity, Class III, BMI 40-49.9 (morbid obesity) (HMay[place] ?BMI 40.6.  Diet exercise advised ? ?Chest pain in patient with underlying coronary artery disease ?Patient has been seen by cardiology, nuclear stress test was positive for ischemia.  Scheduled for heart cath on Monday.  Continue heparin drip, aspirin, Plavix and statin. ? ?Seizure (HMetuchen ? Continue  Keppra and Vimpat ? ?Chronic kidney disease, stage 3b (HFlint [place] ?Renal function still stable. ? ?Type II diabetes mellitus with renal manifestations (HSikeston ?Glucose seem to be controlled.  Continue sliding scale insulin. ? ?Hypertension associated with stage 3 chronic kidney disease due to type 2 diabetes mellitus (HKahului ?Continue home treatment regimen, monitor blood pressure. ? ?History of CVA with residual deficit ? Continue statin, DAPT ? ? ? ?Hyperlipidemia ?--> Continue home statin, she is on high potency statin with rosuvastatin 40 mg daily and reports compliance with this ? ?Essential hypertension ? continue  amlodipine, carvedilol, ? ?Recent  eye infection. ?Still on ofloxacin eyedrops. ? ? ?  ? ?Subjective:  ?Patient no longer has any chest pain.  Denies any short of breath ?No nausea vomiting abdominal pain ? ?Physical Exam: ?Vitals:  ? 05/09/21 0335 05/09/21 0800 05/09/21 0825 05/09/21 0900  ?BP: (!) 162/73  (!) 156/74   ?Pulse: 64  63   ?Resp: '18 16 14 11  '$ ?Temp: 98.6 ?F (37 ?C)  97.9 ?F (36.6 ?C)   ?TempSrc:      ?SpO2: 98%  97%   ?Weight:      ?Height:      ? ?General exam: Appears calm and comfortable, morbid obesity ?Respiratory system: Clear to auscultation. Respiratory effort normal. ?Cardiovascular system: S1 & S2 heard, RRR. No JVD, murmurs, rubs, gallops or clicks. No pedal edema. ?Gastrointestinal system: Abdomen is nondistended, soft and nontender. No organomegaly or masses felt. Normal bowel sounds heard. ?Central nervous system: Alert and oriented. No focal neurological deficits. ?Extremities: Symmetric 5 x 5 power. ?Skin: No rashes, lesions or ulcers ?Psychiatry: Judgement and insight appear normal. Mood & affect appropriate.  ? ?Data Reviewed: ? ?Reviewed all CT studies including spines, head.  Reviewed all lab results. ? ?Family Communication:  ? ?Disposition: ?Status is: Inpatient ?Remains inpatient appropriate because: Pending inpatient procedure, severity of disease, IV treatment ? Planned Discharge Destination: Home with Home Health ? ? ? ?Time spent: 25 minutes ? ?Author: ?DSharen Hones MD ?05/09/2021 3:43 PM ? ?For on call review [email or web address]  ?

## 2021-05-10 ENCOUNTER — Other Ambulatory Visit: Payer: Self-pay

## 2021-05-10 ENCOUNTER — Inpatient Hospital Stay: Payer: Medicare Other

## 2021-05-10 DIAGNOSIS — I200 Unstable angina: Secondary | ICD-10-CM

## 2021-05-10 DIAGNOSIS — I10 Essential (primary) hypertension: Secondary | ICD-10-CM

## 2021-05-10 DIAGNOSIS — R079 Chest pain, unspecified: Secondary | ICD-10-CM

## 2021-05-10 LAB — BASIC METABOLIC PANEL
Anion gap: 6 (ref 5–15)
BUN: 29 mg/dL — ABNORMAL HIGH (ref 6–20)
CO2: 26 mmol/L (ref 22–32)
Calcium: 8.7 mg/dL — ABNORMAL LOW (ref 8.9–10.3)
Chloride: 108 mmol/L (ref 98–111)
Creatinine, Ser: 1.68 mg/dL — ABNORMAL HIGH (ref 0.44–1.00)
GFR, Estimated: 36 mL/min — ABNORMAL LOW (ref >60)
Glucose, Bld: 92 mg/dL (ref 70–99)
Potassium: 5.2 mmol/L — ABNORMAL HIGH (ref 3.5–5.1)
Sodium: 140 mmol/L (ref 135–145)

## 2021-05-10 LAB — HEPARIN LEVEL (UNFRACTIONATED)
Heparin Unfractionated: 0.44 IU/mL (ref 0.30–0.70)
Heparin Unfractionated: 0.38 IU/mL (ref 0.30–0.70)

## 2021-05-10 LAB — TROPONIN I (HIGH SENSITIVITY)
Troponin I (High Sensitivity): 51 ng/L — ABNORMAL HIGH (ref <18)
Troponin I (High Sensitivity): 89 ng/L — ABNORMAL HIGH (ref <18)

## 2021-05-10 LAB — CBC
HCT: 35.8 % — ABNORMAL LOW (ref 36.0–46.0)
Hemoglobin: 11 g/dL — ABNORMAL LOW (ref 12.0–15.0)
MCH: 26.4 pg (ref 26.0–34.0)
MCHC: 30.7 g/dL (ref 30.0–36.0)
MCV: 86.1 fL (ref 80.0–100.0)
Platelets: 166 10*3/uL (ref 150–400)
RBC: 4.16 MIL/uL (ref 3.87–5.11)
RDW: 13.7 % (ref 11.5–15.5)
WBC: 8.6 10*3/uL (ref 4.0–10.5)
nRBC: 0 % (ref 0.0–0.2)

## 2021-05-10 LAB — GLUCOSE, CAPILLARY
Glucose-Capillary: 128 mg/dL — ABNORMAL HIGH (ref 70–99)
Glucose-Capillary: 252 mg/dL — ABNORMAL HIGH (ref 70–99)
Glucose-Capillary: 56 mg/dL — ABNORMAL LOW (ref 70–99)
Glucose-Capillary: 70 mg/dL (ref 70–99)
Glucose-Capillary: 79 mg/dL (ref 70–99)
Glucose-Capillary: 94 mg/dL (ref 70–99)
Glucose-Capillary: 95 mg/dL (ref 70–99)

## 2021-05-10 LAB — MRSA NEXT GEN BY PCR, NASAL: MRSA by PCR Next Gen: NOT DETECTED

## 2021-05-10 LAB — HEMOGLOBIN A1C
Hgb A1c MFr Bld: 5.1 % (ref 4.8–5.6)
Mean Plasma Glucose: 99.67 mg/dL

## 2021-05-10 MED ORDER — LABETALOL HCL 5 MG/ML IV SOLN
10.0000 mg | INTRAVENOUS | Status: DC | PRN
  Filled 2021-05-10: qty 4

## 2021-05-10 MED ORDER — NITROGLYCERIN IN D5W 200-5 MCG/ML-% IV SOLN
0.0000 ug/min | INTRAVENOUS | Status: DC
  Administered 2021-05-10: 5 ug/min via INTRAVENOUS
  Administered 2021-05-11: 45 ug/min via INTRAVENOUS
  Administered 2021-05-12: 80 ug/min via INTRAVENOUS
  Filled 2021-05-10 (×2): qty 250

## 2021-05-10 MED ORDER — HYDROMORPHONE HCL 1 MG/ML IJ SOLN
1.0000 mg | INTRAMUSCULAR | Status: DC | PRN
  Administered 2021-05-11 – 2021-05-12 (×2): 1 mg via INTRAVENOUS
  Filled 2021-05-10 (×2): qty 1

## 2021-05-10 MED ORDER — NITROGLYCERIN IN D5W 200-5 MCG/ML-% IV SOLN
0.0000 ug/min | INTRAVENOUS | Status: DC
  Filled 2021-05-10: qty 250

## 2021-05-10 MED ORDER — INSULIN ASPART 100 UNIT/ML IJ SOLN
0.0000 [IU] | Freq: Three times a day (TID) | INTRAMUSCULAR | Status: DC
  Administered 2021-05-10: 3 [IU] via SUBCUTANEOUS
  Administered 2021-05-11: 1 [IU] via SUBCUTANEOUS
  Administered 2021-05-12: 2 [IU] via SUBCUTANEOUS
  Administered 2021-05-12: 1 [IU] via SUBCUTANEOUS
  Filled 2021-05-10 (×4): qty 1

## 2021-05-10 MED ORDER — HYDROMORPHONE HCL 1 MG/ML IJ SOLN
INTRAMUSCULAR | Status: AC
  Administered 2021-05-10: 1 mg via INTRAVENOUS
  Filled 2021-05-10: qty 1

## 2021-05-10 MED ORDER — LABETALOL HCL 5 MG/ML IV SOLN
20.0000 mg | INTRAVENOUS | Status: DC | PRN
  Administered 2021-05-10: 20 mg via INTRAVENOUS

## 2021-05-10 MED ORDER — CHLORHEXIDINE GLUCONATE CLOTH 2 % EX PADS
6.0000 | MEDICATED_PAD | Freq: Every day | CUTANEOUS | Status: DC
  Administered 2021-05-10 – 2021-05-12 (×3): 6 via TOPICAL

## 2021-05-10 MED ORDER — SODIUM POLYSTYRENE SULFONATE 15 GM/60ML PO SUSP
30.0000 g | Freq: Once | ORAL | Status: AC
Start: 2021-05-10 — End: 2021-05-10
  Administered 2021-05-10: 30 g via ORAL
  Filled 2021-05-10: qty 120

## 2021-05-10 MED ORDER — IOHEXOL 350 MG/ML SOLN
75.0000 mL | Freq: Once | INTRAVENOUS | Status: AC | PRN
  Administered 2021-05-10: 75 mL via INTRAVENOUS

## 2021-05-10 NOTE — Progress Notes (Signed)
? ?Progress Note ? ?Patient Name: [name] & [name] [facility] ?Date of Encounter: 05/10/2021 ? ?[facility] Cardiologist: Ida Rogue, MD  ? ?Subjective  ? ?51 year old female with a history of coronary artery disease.  She presented with episodes of chest discomfort. ?Troponin levels were minimally elevated with a flat trend. ?Myoview study from yesterday revealed presence of ischemia in the mid and basal inferior walls. ? ?She is scheduled for heart catheterization on Monday. ?She had an episode of diarrhea this am ?BP is elevated,  HR is elevated also  ? ? ?Inpatient Medications  ?  ?Scheduled Meds: ? ALPRAZolam  1 mg Oral BID  ? amLODipine  10 mg Oral Daily  ? aspirin EC  81 mg Oral Daily  ? carvedilol  6.25 mg Oral BID  ? clopidogrel  75 mg Oral Daily  ? gabapentin  600 mg Oral BID  ? insulin aspart  0-6 Units Subcutaneous TID WC  ? isosorbide mononitrate  120 mg Oral Daily  ? lacosamide  200 mg Oral BID  ? levETIRAcetam  1,000 mg Oral BID  ? levothyroxine  25 mcg Oral QAC breakfast  ? mirabegron ER  50 mg Oral Daily  ? mirtazapine  7.5 mg Oral QHS  ? ofloxacin  1 drop Right Eye QID  ? pantoprazole  40 mg Oral BID  ? prednisoLONE acetate  1 drop Right Eye QID  ? rosuvastatin  40 mg Oral q1800  ? venlafaxine XR  75 mg Oral Q breakfast  ? ?Continuous Infusions: ? [START ON 05/11/2021] sodium chloride    ? heparin 1,300 Units/hr (05/10/21 0734)  ? ?PRN Meds: ?acetaminophen, baclofen, diclofenac Sodium, morphine injection, nitroGLYCERIN, ondansetron (ZOFRAN) IV, polyvinyl alcohol, Rimegepant Sulfate  ? ?Vital Signs  ?  ?Vitals:  ? 05/09/21 2202 05/09/21 2358 05/10/21 0357 05/10/21 0752  ?BP: (!) 147/103 (!) 170/90 134/84 (!) 154/77  ?Pulse: 80 81 63 63  ?Resp:  '16 16 18  '$ ?Temp:  98 ?F (36.7 ?C) 97.6 ?F (36.4 ?C) 98.2 ?F (36.8 ?C)  ?TempSrc:      ?SpO2:  100% 100% 100%  ?Weight:      ?Height:      ? ? ?Intake/Output Summary (Last 24 hours) at 05/10/2021 1114 ?Last data filed at 05/10/2021 1013 ?Gross per 24 hour  ?Intake  973.16 ml  ?Output 1000 ml  ?Net -26.84 ml  ? ? ?  05/08/2021  ? 12:56 PM 05/05/2021  ? 11:36 AM 04/29/2021  ?  2:10 PM  ?Last 3 Weights  ?Weight (lbs) 165 lb 4.8 oz 159 lb 159 lb  ?Weight (kg) 74.98 kg 72.122 kg 72.122 kg  ?   ? ?Telemetry  ?  ?NSR  - Personally Reviewed ? ?ECG  ?  ? Nsr  - Personally Reviewed ? ?Physical Exam  ? ?GEN: No acute distress.   ?Neck: No JVD ?Cardiac: RRR, no murmurs, rubs, or gallops.  ?Respiratory: Clear to auscultation bilaterally. ?GI: Soft, nontender, non-distended  ?MS: No edema; No deformity. ?Neuro:  Nonfocal  ?Psych: Normal affect  ? ?Labs  ?  ?High Sensitivity Troponin:   ?Recent Labs  ?Lab 05/08/21 ?1300 05/08/21 ?1501  ?TROPONINIHS 36* 35*  ?   ?Chemistry ?Recent Labs  ?Lab 05/08/21 ?1300 05/10/21 ?0440  ?NA 140 140  ?K 4.7 5.2*  ?CL 106 108  ?CO2 27 26  ?GLUCOSE 119* 92  ?BUN 23* 29*  ?CREATININE 1.57* 1.68*  ?CALCIUM 8.8* 8.7*  ?GFRNONAA 39* 36*  ?ANIONGAP 7 6  ?  ?Lipids No  results for input(s): CHOL, TRIG, HDL, LABVLDL, LDLCALC, CHOLHDL in the last 168 hours.  ?Hematology ?Recent Labs  ?Lab 05/08/21 ?1300 05/10/21 ?0440  ?WBC 8.2 8.6  ?RBC 4.40 4.16  ?HGB 11.4* 11.0*  ?HCT 37.3 35.8*  ?MCV 84.8 86.1  ?MCH 25.9* 26.4  ?MCHC 30.6 30.7  ?RDW 13.6 13.7  ?PLT 186 166  ? ?Thyroid No results for input(s): TSH, FREET4 in the last 168 hours.  ?BNPNo results for input(s): BNP, PROBNP in the last 168 hours.  ?DDimer No results for input(s): DDIMER in the last 168 hours.  ? ?Radiology  ?  ?DG Chest 2 View ? ?Result Date: 05/08/2021 ?CLINICAL DATA:  Chest pain EXAM: CHEST - 2 VIEW COMPARISON:  None. FINDINGS: The cardiomediastinal silhouette is within normal limits. Unchanged metallic densities overlying the upper mediastinum. No focal airspace consolidation. No pleural effusion. No pneumothorax. There is no acute osseous abnormality. Thoracic spondylosis. IMPRESSION: No evidence of acute cardiopulmonary disease. Electronically Signed   By: Maurine Simmering M.D.   On: 05/08/2021 13:22  ? ?NM  Myocar Multi W/Spect W/Wall Motion / EF ? ?Result Date: 05/09/2021 ?  Findings are consistent with ischemia. The study is intermediate risk.   No ST deviation was noted.   LV perfusion is abnormal. There is evidence of ischemia. There is no evidence of infarction. Defect 1: There is a medium defect with moderate reduction in uptake present in the mid to basal inferior and inferolateral location(s) that is partially reversible. There is normal wall motion in the defect area. Consistent with ischemia.   Left ventricular function is abnormal. End diastolic cavity size is normal. End systolic cavity size is normal.   There is evidence of inferior wall ischemia but the study is suboptimal due to GI uptake.   ? ?Cardiac Studies  ? ?  ? ?Patient Profile  ?   ?52 y.o. female   ? ?Assessment & Plan  ?  ? CAD : She has hypertension with some associated chest pain this morning.  We given her nitroglycerin.  She occasionally has episodes of hypertension similar to this.  Myoview study from yesterday reveals inferior ischemia.  She is scheduled for heart catheterization on Monday. ? ?2.  Hypertension: She is severely hypertensive this morning.  We have given her nitroglycerin.  We will also order labetalol 10 mg IV as needed.  She says she occasionally has these episodes where her blood pressure will go up for a brief time. ? ?3.  Diarrhea: She had an episode of diarrhea.  We will have the primary medical doctor address this. ? ? ? ?   ? ?For questions or updates, please contact [place] ?Please consult [email or web address] for contact info under  ? ?  ?   ?Signed, ?Mertie Moores, MD  ?05/10/2021, 11:14 AM   ? ?

## 2021-05-10 NOTE — Progress Notes (Signed)
?  Progress Note ? ? ?Patient: [name] DJS:[id number] DOB: [birth year] DOA: 05/08/2021     2 ?DOS: the patient was seen and examined on 05/10/2021 ?  ?Brief hospital course: ?[name] is a 51 year old female with history of coronary disease status post PCI, chronic congestive heart failure with normalized ejection fraction, chronic kidney disease stage IIIa, history of GI bleed, history of CVA, aortic arch aneurysm, type 2 diabetes, obesity status post gastric bypass surgery, bipolar disorder, essential hypertension, dyslipidemia, migraine headache who present to the hospital with chest pain. ?She had multiple falls for the last 53-month she also had a eye infection which she had a surgery 2 days ago.  Still taking antibiotics. ?Upon arriving the hospital, she has a peak troponin of 36. ? ?05/09/2021.  Nuclear stress test showed reverse ischemia.  Scheduled for heart cath on Monday. ? ?Assessment and Plan: ?Chest pain in patient with underlying coronary artery disease ?Patient has been seen by cardiology, nuclear stress test was positive for ischemia.   Continue heparin drip, aspirin, Plavix and statin. ?Patient still has intermittent chest pain, last episode was last night.  Resolved after giving morphine.  Continue heparin drip. ? ?Seizure (HMagnolia ? Continue  Keppra and Vimpat ? ?Chronic kidney disease, stage 3b (HKaylor ?Renal function stable, will start IV fluids tomorrow night for scheduled heart cath. ? ?Type II diabetes mellitus with renal manifestations (HFairmount ?Patient had episode of hypoglycemia yesterday, will reduce sliding scale to 0 to 6 units 3 times a day with meals.  No coverage for night. ? ?Hypertension associated with stage 3 chronic kidney disease due to type 2 diabetes mellitus (HMonona ?Continue amlodipine and Coreg ? ?History of CVA with residual deficit ? Continue statin, DAPT ? ? ? ?Hyperlipidemia ?--> Continue home statin, she is on high potency statin with rosuvastatin 40 mg daily  and reports compliance with this ? ?Essential hypertension ? continue  amlodipine, carvedilol, ? ? ?Patient BMI of 29.28 now, morbid obesity ruled out. ? ?  ? ?Subjective:  ?Patient had another episode of chest pain last night, was given morphine, she slept afterwards.  Pain has resolved.  She did not have short of breath ? ? ?Physical Exam: ?Vitals:  ? 05/09/21 2202 05/09/21 2358 05/10/21 0357 05/10/21 0752  ?BP: (!) 147/103 (!) 170/90 134/84 (!) 154/77  ?Pulse: 80 81 63 63  ?Resp:  '16 16 18  '$ ?Temp:  98 ?F (36.7 ?C) 97.6 ?F (36.4 ?C) 98.2 ?F (36.8 ?C)  ?TempSrc:      ?SpO2:  100% 100% 100%  ?Weight:      ?Height:      ? ?General exam: Appears calm and comfortable  ?Respiratory system: Clear to auscultation. Respiratory effort normal. ?Cardiovascular system: S1 & S2 heard, RRR. No JVD, murmurs, rubs, gallops or clicks. No pedal edema. ?Gastrointestinal system: Abdomen is nondistended, soft and nontender. No organomegaly or masses felt. Normal bowel sounds heard. ?Central nervous system: Alert and oriented. No focal neurological deficits. ?Extremities: Symmetric 5 x 5 power. ?Skin: No rashes, lesions or ulcers ?Psychiatry: Judgement and insight appear normal. Mood & affect appropriate.  ? ?Data Reviewed: ? ?Reviewed stress test results, reviewed labs. ? ?Family Communication: son updated ? ?Disposition: ?Status is: Inpatient ?Remains inpatient appropriate because: Severity of disease, pending procedure. ? Planned Discharge Destination: Home with Home Health ? ? ? ?Time spent: 28 minutes ? ?Author: ?DSharen Hones MD ?05/10/2021 10:17 AM ? ?For on call review [email or web address]  ?

## 2021-05-10 NOTE — Consult Note (Signed)
ANTICOAGULATION CONSULT NOTE  ?Pharmacy Consult for heparin ?Indication: chest pain/ACS ? ?Allergies  ?Allergen Reactions  ? Desyrel [Trazodone] Other (See Comments)  ?  Pt states this medication brings on seizures   ? Lipitor [Atorvastatin] Other (See Comments)  ?  Leg pains  ? ? ?Patient Measurements: ?Height: '5\' 3"'  (160 cm) ?Weight: 75 kg (165 lb 4.8 oz) ?IBW/kg (Calculated) : 52.4 ?Heparin Dosing Weight: 68.3 kg ? ?Vital Signs: ?Temp: 98.1 ?F (36.7 ?C) (04/08 1118) ?BP: 139/84 (04/08 1201) ?Pulse Rate: 75 (04/08 1201) ? ?Labs: ?Recent Labs  ?  05/08/21 ?1300 05/08/21 ?1501 05/08/21 ?2254 05/09/21 ?1952 05/10/21 ?4158 05/10/21 ?1039  ?HGB 11.4*  --   --   --  11.0*  --   ?HCT 37.3  --   --   --  35.8*  --   ?PLT 186  --   --   --  166  --   ?APTT 38*  --   --   --   --   --   ?LABPROT 13.2  --   --   --   --   --   ?INR 1.0  --   --   --   --   --   ?HEPARINUNFRC  --   --    < > 0.25* 0.44 0.38  ?CREATININE 1.57*  --   --   --  1.68*  --   ?TROPONINIHS 36* 35*  --   --   --   --   ? < > = values in this interval not displayed.  ? ? ? ?Estimated Creatinine Clearance: 38 mL/min (A) (by C-G formula based on SCr of 1.68 mg/dL (H)). ? ? ?Medical History: ?Past Medical History:  ?Diagnosis Date  ? Acute colitis 01/27/2017  ? Acute pyelonephritis   ? Acute upper GI bleed 01/25/2020  ? Anemia   ? iron deficiency anemia  ? Aortic arch aneurysm ([place])   ? BRCA negative 2014  ? CAD (coronary artery disease)   ? a. 08/2003 Cath: LAD 30-40-med Rx; b. 11/2014 PCI: LAD 53m(3.25x23 Xience Alpine DES); c. 06/2015 PCI: D1 (2.25x12 Resolute Integrity DES); d. 06/2017 PCI: Patent mLAD stent, D2 95 (PTCA); e. 09/2017 PCI: D2 99ost (CBA); d. 12/2017 Cath: LM nl, LAD 318m80d (small), D1 40ost, D2 95ost, LCX 40p, RCA 40ost/p->Med rx for D2 given restenosis.  ? Closed nondisplaced intertrochanteric fracture of left femur (HCHagerman9/10/2018  ? Colitis 06/03/2015  ? Colon polyp   ? [facility]rebral vascular accident) ([facility]  ? Left side weakness.   ?  Degenerative tear of glenoid labrum of right shoulder 03/15/2017  ? Diabetes mellitus without complication (HCLizton  ? Family history of breast cancer   ? BRCA neg 2014  ? Femur fracture, left (HCCorral City9/10/2018  ? Gastric ulcer 04/27/2011  ? Helicobacter pylori infection 11/23/2014  ? History of echocardiogram   ? a. 03/2017 Echo: EF 60-65%, no rwma; b. 02/2018 Echo: EF 60-65%, no rwma. Nl RV fxn. No cardiac source of emboli (admitted w/ stroke).  ? Hypertension   ? Iron deficiency anemia secondary to blood loss (chronic)   ? Malignant melanoma of skin of scalp (HCOberlin  ? MI, acute, non ST segment elevation (HCChamp  ? Neuromuscular disorder (HCParma  ? S/P drug eluting coronary stent placement 06/04/2015  ? Sepsis (HCFort Yates2/14/2019  ? Sepsis secondary to UTI (HCOhiowa3/22/2022  ? [facility] ([facility]  ? a. 02/2018 MRI: 28m428mate  acute/early subacute L medial frontal lobe inarct; b. 02/2018 MRA No large vessel occlusion or aneurysm. Mod to sev L P2 stenosis. thready L vertebral artery, diffusely dzs'd; c. 02/2018 Carotid U/S: <50% bilat ICA dzs.  ? Unspecified atrial fibrillation ([place]) 04/23/2020  ?Heparin Dosing Weight: 68.3 kg ? ?Medications:  ?No PTA anticoagulation, only aspirin 81 mg daily  ? ?Assessment: ?51 y.o. female with a past medical history of CAD, diabetes, and HTN presenting to the emergency department for intermittent chest pain x3 days not relieved by nitroglycerin. Pharmacy has been consulted for heparin dosing for ACS.  ? ?Baseline labs: Hgb 11.4, plts 186, aPTT and INR ordered  ? ?Goal of Therapy:  ?Heparin level 0.3-0.7 units/ml ?Monitor platelets by anticoagulation protocol: Yes ? ?4/06 2254 HL 0.23, subtherapeutic ?4/07 0536 HL 0.25, subtherapeutic ?4/07 1423 HL 0.39, therapeutic x 1  @ 1150 units/hr ?4/07 1952 HL 0.25, sutherapeutic 1150 > 1300 un/hr ?4/08 0440 HL 0.44, therapeutic x 1 ?4/08 1039 HL0.38, thera x2 ? ? ?Plan:  ?Continue heparin infusion at 1300 units/hr ?F/u HL w/ am labs ?[facility] 05/12/21 ?CBC  daily while on heparin. ? ?Chinita Greenland PharmD ?Clinical Pharmacist ?05/10/2021 ? ? ? ? ? ?

## 2021-05-10 NOTE — Consult Note (Signed)
ANTICOAGULATION CONSULT NOTE  ?Pharmacy Consult for heparin ?Indication: chest pain/ACS ? ?Allergies  ?Allergen Reactions  ? Desyrel [Trazodone] Other (See Comments)  ?  Pt states this medication brings on seizures   ? Lipitor [Atorvastatin] Other (See Comments)  ?  Leg pains  ? ? ?Patient Measurements: ?Height: '5\' 3"'  (160 cm) ?Weight: 75 kg (165 lb 4.8 oz) ?IBW/kg (Calculated) : 52.4 ?Heparin Dosing Weight: 68.3 kg ? ?Vital Signs: ?Temp: 97.6 ?F (36.4 ?C) (04/08 0357) ?BP: 134/84 (04/08 0357) ?Pulse Rate: 63 (04/08 0357) ? ?Labs: ?Recent Labs  ?  05/08/21 ?1300 05/08/21 ?1501 05/08/21 ?2254 05/09/21 ?1423 05/09/21 ?1952 05/10/21 ?0440  ?HGB 11.4*  --   --   --   --  11.0*  ?HCT 37.3  --   --   --   --  35.8*  ?PLT 186  --   --   --   --  166  ?APTT 38*  --   --   --   --   --   ?LABPROT 13.2  --   --   --   --   --   ?INR 1.0  --   --   --   --   --   ?HEPARINUNFRC  --   --    < > 0.39 0.25* 0.44  ?CREATININE 1.57*  --   --   --   --  1.68*  ?TROPONINIHS 36* 35*  --   --   --   --   ? < > = values in this interval not displayed.  ? ? ? ?Estimated Creatinine Clearance: 38 mL/min (A) (by C-G formula based on SCr of 1.68 mg/dL (H)). ? ? ?Medical History: ?Past Medical History:  ?Diagnosis Date  ? Acute colitis 01/27/2017  ? Acute pyelonephritis   ? Acute upper GI bleed 01/25/2020  ? Anemia   ? iron deficiency anemia  ? Aortic arch aneurysm ([place])   ? BRCA negative 2014  ? CAD (coronary artery disease)   ? a. 08/2003 Cath: LAD 30-40-med Rx; b. 11/2014 PCI: LAD 14m(3.25x23 Xience Alpine DES); c. 06/2015 PCI: D1 (2.25x12 Resolute Integrity DES); d. 06/2017 PCI: Patent mLAD stent, D2 95 (PTCA); e. 09/2017 PCI: D2 99ost (CBA); d. 12/2017 Cath: LM nl, LAD 334m80d (small), D1 40ost, D2 95ost, LCX 40p, RCA 40ost/p->Med rx for D2 given restenosis.  ? Closed nondisplaced intertrochanteric fracture of left femur (HCLittle Ferry9/10/2018  ? Colitis 06/03/2015  ? Colon polyp   ? CVA (cerebral vascular accident) (HKahi Mohala  ? Left side weakness.   ?  Degenerative tear of glenoid labrum of right shoulder 03/15/2017  ? Diabetes mellitus without complication (HCCissna Park  ? Family history of breast cancer   ? BRCA neg 2014  ? Femur fracture, left (HCJulian9/10/2018  ? Gastric ulcer 04/27/2011  ? Helicobacter pylori infection 11/23/2014  ? History of echocardiogram   ? a. 03/2017 Echo: EF 60-65%, no rwma; b. 02/2018 Echo: EF 60-65%, no rwma. Nl RV fxn. No cardiac source of emboli (admitted w/ stroke).  ? Hypertension   ? Iron deficiency anemia secondary to blood loss (chronic)   ? Malignant melanoma of skin of scalp (HCMontezuma  ? MI, acute, non ST segment elevation (HCReno  ? Neuromuscular disorder (HCBoulevard Park  ? S/P drug eluting coronary stent placement 06/04/2015  ? Sepsis (HCBrowntown2/14/2019  ? Sepsis secondary to UTI (HCHarmony3/22/2022  ? [facility] ([facility]  ? a. 02/2018 MRI: 78m61mate  acute/early subacute L medial frontal lobe inarct; b. 02/2018 MRA No large vessel occlusion or aneurysm. Mod to sev L P2 stenosis. thready L vertebral artery, diffusely dzs'd; c. 02/2018 Carotid U/S: <50% bilat ICA dzs.  ? Unspecified atrial fibrillation ([place]) 04/23/2020  ?Heparin Dosing Weight: 68.3 kg ? ?Medications:  ?No PTA anticoagulation, only aspirin 81 mg daily  ? ?Assessment: ?51 y.o. female with a past medical history of CAD, diabetes, and HTN presenting to the emergency department for intermittent chest pain x3 days not relieved by nitroglycerin. Pharmacy has been consulted for heparin dosing for ACS.  ? ?Baseline labs: Hgb 11.4, plts 186, aPTT and INR ordered  ? ?Goal of Therapy:  ?Heparin level 0.3-0.7 units/ml ?Monitor platelets by anticoagulation protocol: Yes ? ?4/06 2254 HL 0.23, subtherapeutic ?4/07 0536 HL 0.25, subtherapeutic ?4/07 1423 HL 0.39, therapeutic x 1  @ 1150 units/hr ?4/07 1952 HL 0.25, sutherapeutic 1150 > 1300 un/hr ?4/08 0440 HL 0.44, therapeutic x 1 ? ? ?Plan:  ?Continue heparin infusion at 1300 units/hr ?Recheck HL in 6 hr to confirm rate ?CBC daily while on heparin. ? ?Renda Rolls, PharmD, MBA ?05/10/2021 ?5:47 AM ? ? ? ? ?

## 2021-05-10 NOTE — Progress Notes (Signed)
Cross Cover ?Patient moved to stepdown by day team for hypertensive urgency and aortic dissection concern.  ?On arrival to ICU, patient with nitro drip still infusion, blood pressure much improved and patient without pain. SR on monitor, sats stable on room air and no shortness of breath.  No pulse deficit on exam.  ? ?CT results ?IMPRESSION: ?1. No acute CTA abnormality. No evidence of thoracic or abdominal aortic aneurysm. ?2. Small amounts of aortic atherosclerosis, right-sided aortic arch ?with surgically occluded aberrant left subclavian artery, with ?patent left common carotid to left subclavian bypass graft. ?3. The only flow-limiting stenoses involve the most proximal renal arteries, with soft plaque stenosis of 40% on the right and 80-90%on the left. ?4. Coronary artery calcifications heaviest in the LAD. ?5. Chronic compression of the esophagus between the aberrant left subclavian artery and trachea, with increasing thickening in the more cephalad thoracic esophagus, chronic thickening and patulous appearance in the more distal thoracic esophagus. ?6. Scattered ground-glass opacities of the lungs most likely due to multifocal pneumonitis including viral and atypical etiologies. ?Follow-up study recommended to ensure clearing. ?7. Bilateral renal cortical thinning and scarring with small renal ?lengths. ?8. Hepatic steatosis and enlargement.  Unchanged. ?9. Constipation and diverticulosis.  Old gastric bypass. ?10. Moderately distended bladder without focal abnormality. ?11. Scattered subcutaneous opacities in the abdominal wall not seen previously, possibly sites of subcutaneous injection but clinical correlation advised. ?Pneumonitis likely highly contributory to chest pain symptoms ?Discussed pneumonitis finding with patient. Resp Viral PCR odered ?Patient follows with Dr. Allen Norris as outpatient for chronic and has chronic swallowing problems. Pneumonitis is most likely from chronic aspiration.  Patient  state she would like to follow up with Dr Allen Norris as outpatient with regards to swallow reevaluation ?

## 2021-05-10 NOTE — Progress Notes (Signed)
PCCM team alerted to patient's transfer to SDU. ? ?Acute on chronic chest pain in the setting of Hypertensive urgency and CAD ?PMHx: CAD, HTN ?BP 203/103 at 17:00, [facility] & cardiology alerted. Pt. Received: nitro SL and placed on nitro drip, morphine IV, labetalol IV & dilaudid. Upon arrival to SDU, pt alert & oriented, doppler pulses equal on bedside assessment with Rachael Fee, [facility] NP. Current CP 6/10, with nitro drip infusing at 5 mcg/min BP 175/108, bedside RN titrating up on drip to 10 mcg/min. ?- Agree with current plan of care, Cardiology following, scheduled for heart cath Monday 4/10 ?- continue nitro drip ?- CTa results pending, trend troponin, heparin drip infusing since 05/08/21 ?Case discussed with Dr. Jonnie Finner >> we will follow along peripherally, if patient deteriorates PCCM will officially consult ? ? ?Domingo Pulse [place], AGACNP-BC ?Acute Care Nurse Practitioner ?[facility]  ? ?[phone] / [phone] ?Please see Amion for pager details.  ? ?

## 2021-05-10 NOTE — Progress Notes (Signed)
Patient developed severe chest pain, Bp is elevated 213/112. EKG does not have ST elevation.  ?Talked with Dr. Acie Fredrickson, repeat troponin, give labetalol, dilaudid, nitro drip. Will transfer to ICU stepdown unit, Dr. Jonnie Finner notified, he will help monitor patient, if condition worsens, he will take over.  ?Will also obtain CT angio stat to rule out aortic dissection.  ?

## 2021-05-11 DIAGNOSIS — I701 Atherosclerosis of renal artery: Secondary | ICD-10-CM

## 2021-05-11 DIAGNOSIS — I214 Non-ST elevation (NSTEMI) myocardial infarction: Secondary | ICD-10-CM

## 2021-05-11 DIAGNOSIS — K209 Esophagitis, unspecified without bleeding: Secondary | ICD-10-CM

## 2021-05-11 LAB — RESPIRATORY PANEL BY PCR

## 2021-05-11 LAB — BASIC METABOLIC PANEL
Anion gap: 8 (ref 5–15)
BUN: 29 mg/dL — ABNORMAL HIGH (ref 6–20)
CO2: 26 mmol/L (ref 22–32)
Calcium: 8.8 mg/dL — ABNORMAL LOW (ref 8.9–10.3)
Chloride: 106 mmol/L (ref 98–111)
Creatinine, Ser: 1.42 mg/dL — ABNORMAL HIGH (ref 0.44–1.00)
GFR, Estimated: 45 mL/min — ABNORMAL LOW (ref >60)
Glucose, Bld: 107 mg/dL — ABNORMAL HIGH (ref 70–99)
Potassium: 4.2 mmol/L (ref 3.5–5.1)
Sodium: 140 mmol/L (ref 135–145)

## 2021-05-11 LAB — CBC
HCT: 32.6 % — ABNORMAL LOW (ref 36.0–46.0)
Hemoglobin: 10.2 g/dL — ABNORMAL LOW (ref 12.0–15.0)
MCH: 26.1 pg (ref 26.0–34.0)
MCHC: 31.3 g/dL (ref 30.0–36.0)
MCV: 83.4 fL (ref 80.0–100.0)
Platelets: 168 10*3/uL (ref 150–400)
RBC: 3.91 MIL/uL (ref 3.87–5.11)
RDW: 13.7 % (ref 11.5–15.5)
WBC: 8.8 10*3/uL (ref 4.0–10.5)
nRBC: 0 % (ref 0.0–0.2)

## 2021-05-11 LAB — HEPARIN LEVEL (UNFRACTIONATED): Heparin Unfractionated: 0.53 IU/mL (ref 0.30–0.70)

## 2021-05-11 LAB — GLUCOSE, CAPILLARY
Glucose-Capillary: 106 mg/dL — ABNORMAL HIGH (ref 70–99)
Glucose-Capillary: 166 mg/dL — ABNORMAL HIGH (ref 70–99)
Glucose-Capillary: 109 mg/dL — ABNORMAL HIGH (ref 70–99)
Glucose-Capillary: 134 mg/dL — ABNORMAL HIGH (ref 70–99)

## 2021-05-11 LAB — TROPONIN I (HIGH SENSITIVITY)
Troponin I (High Sensitivity): 176 ng/L (ref <18)
Troponin I (High Sensitivity): 96 ng/L — ABNORMAL HIGH (ref <18)

## 2021-05-11 MED ORDER — CARVEDILOL 25 MG PO TABS
25.0000 mg | ORAL_TABLET | Freq: Two times a day (BID) | ORAL | Status: DC
  Administered 2021-05-11 – 2021-05-13 (×5): 25 mg via ORAL
  Filled 2021-05-11 (×5): qty 1

## 2021-05-11 MED ORDER — DIPHENHYDRAMINE HCL 25 MG PO CAPS
25.0000 mg | ORAL_CAPSULE | Freq: Two times a day (BID) | ORAL | Status: DC | PRN
  Administered 2021-05-11 – 2021-05-12 (×3): 25 mg via ORAL
  Filled 2021-05-11 (×6): qty 1

## 2021-05-11 MED ORDER — DIPHENHYDRAMINE HCL 50 MG/ML IJ SOLN
INTRAMUSCULAR | Status: AC
  Administered 2021-05-11: 12.5 mg via INTRAVENOUS
  Filled 2021-05-11: qty 1

## 2021-05-11 MED ORDER — DIPHENHYDRAMINE HCL 50 MG/ML IJ SOLN
12.5000 mg | Freq: Once | INTRAMUSCULAR | Status: AC
  Administered 2021-05-11: 12.5 mg via INTRAVENOUS

## 2021-05-11 MED ORDER — SUCRALFATE 1 GM/10ML PO SUSP
1.0000 g | Freq: Three times a day (TID) | ORAL | Status: DC
  Administered 2021-05-11 – 2021-05-12 (×4): 1 g via ORAL
  Filled 2021-05-11 (×10): qty 10

## 2021-05-11 NOTE — Assessment & Plan Note (Signed)
Incidental finding of a left renal artery stenosis.  Patient will need to follow-up with vascular surgery after discharge from hospital. ?

## 2021-05-11 NOTE — Assessment & Plan Note (Addendum)
Cardiac cath showed severe two-vessel coronary disease with 95% distal RCA stenosis as well as multisegmental distal LAD disease as up to 70%, 90% ostial D2 stenosis. ?Patient has a drug-eluting stent placed in RCA. ?Chest pain since has resolved. ?Resume home dose of dual antiplatelet treatment and follow-up with cardiology in 2 weeks ?

## 2021-05-11 NOTE — Consult Note (Signed)
ANTICOAGULATION CONSULT NOTE  ?Pharmacy Consult for heparin ?Indication: chest pain/ACS ? ?Allergies  ?Allergen Reactions  ? Desyrel [Trazodone] Other (See Comments)  ?  Pt states this medication brings on seizures   ? Lipitor [Atorvastatin] Other (See Comments)  ?  Leg pains  ? ? ?Patient Measurements: ?Height: _0  (160 cm) ?Weight: 69.8 kg (153 lb 14.1 oz) ?IBW/kg (Calculated) : 52.4 ?Heparin Dosing Weight: 68.3 kg ? ?Vital Signs: ?Temp: 98 ?F (36.7 ?C) (04/09 0400) ?Temp Source: Oral (04/09 0400) ?BP: 143/80 (04/09 0500) ?Pulse Rate: 60 (04/09 0500) ? ?Labs: ?Recent Labs  ?  05/08/21 ?1300 05/08/21 ?1501 05/08/21 ?2254 05/10/21 ?5170 05/10/21 ?1039 05/10/21 ?1831 05/10/21 ?2054 05/11/21 ?0533  ?HGB 11.4*  --   --  11.0*  --   --   --  10.2*  ?HCT 37.3  --   --  35.8*  --   --   --  32.6*  ?PLT 186  --   --  166  --   --   --  168  ?APTT 38*  --   --   --   --   --   --   --   ?LABPROT 13.2  --   --   --   --   --   --   --   ?INR 1.0  --   --   --   --   --   --   --   ?HEPARINUNFRC  --   --    < > 0.44 0.38  --   --  0.53  ?CREATININE 1.57*  --   --  1.68*  --   --   --   --   ?TROPONINIHS 36* 35*  --   --   --  51* 89*  --   ? < > = values in this interval not displayed.  ? ? ? ?Estimated Creatinine Clearance: 36.7 mL/min (A) (by C-G formula based on SCr of 1.68 mg/dL (H)). ? ? ?Medical History: ?Past Medical History:  ?Diagnosis Date  ? Acute colitis 01/27/2017  ? Acute pyelonephritis   ? Acute upper GI bleed 01/25/2020  ? Anemia   ? iron deficiency anemia  ? Aortic arch aneurysm ([place])   ? BRCA negative 2014  ? CAD (coronary artery disease)   ? a. 08/2003 Cath: LAD 30-40-med Rx; b. 11/2014 PCI: LAD 73m(3.25x23 Xience Alpine DES); c. 06/2015 PCI: D1 (2.25x12 Resolute Integrity DES); d. 06/2017 PCI: Patent mLAD stent, D2 95 (PTCA); e. 09/2017 PCI: D2 99ost (CBA); d. 12/2017 Cath: LM nl, LAD 36m80d (small), D1 40ost, D2 95ost, LCX 40p, RCA 40ost/p->Med rx for D2 given restenosis.  ? Closed nondisplaced  intertrochanteric fracture of left femur (HCHorseshoe Bay9/10/2018  ? Colitis 06/03/2015  ? Colon polyp   ? [facility]rebral vascular accident) ([facility]  ? Left side weakness.   ? Degenerative tear of glenoid labrum of right shoulder 03/15/2017  ? Diabetes mellitus without complication (HCThompsonville  ? Family history of breast cancer   ? BRCA neg 2014  ? Femur fracture, left (HCLock[place]9/10/2018  ? Gastric ulcer 04/27/2011  ? Helicobacter pylori infection 11/23/2014  ? History of echocardiogram   ? a. 03/2017 Echo: EF 60-65%, no rwma; b. 02/2018 Echo: EF 60-65%, no rwma. Nl RV fxn. No cardiac source of emboli (admitted w/ stroke).  ? Hypertension   ? Iron deficiency anemia secondary to blood loss (chronic)   ? Malignant melanoma of skin of  scalp ([place])   ? MI, acute, non ST segment elevation ([place])   ? Neuromuscular disorder ([place])   ? S/P drug eluting coronary stent placement 06/04/2015  ? Sepsis ([place]) 03/18/2017  ? Sepsis secondary to UTI ([place]) 04/23/2020  ? Stroke [facility])   ? a. 02/2018 MRI: 40m late acute/early subacute L medial frontal lobe inarct; b. 02/2018 MRA No large vessel occlusion or aneurysm. Mod to sev L P2 stenosis. thready L vertebral artery, diffusely dzs'd; c. 02/2018 Carotid U/S: <50% bilat ICA dzs.  ? Unspecified atrial fibrillation (HLeoti 04/23/2020  ?Heparin Dosing Weight: 68.3 kg ? ?Medications:  ?No PTA anticoagulation, only aspirin 81 mg daily  ? ?Assessment: ?51y.o. female with a past medical history of CAD, diabetes, and HTN presenting to the emergency department for intermittent chest pain x3 days not relieved by nitroglycerin. Pharmacy has been consulted for heparin dosing for ACS.  ? ?Baseline labs: Hgb 11.4, plts 186, aPTT and INR ordered  ? ?Goal of Therapy:  ?Heparin level 0.3-0.7 units/ml ?Monitor platelets by anticoagulation protocol: Yes ? ?4/06 2254 HL 0.23, subtherapeutic ?4/07 0536 HL 0.25, subtherapeutic ?4/07 1423 HL 0.39, therapeutic x 1  @ 1150 units/hr ?4/07 1952 HL 0.25, sutherapeutic 1150 > 1300 un/hr ?4/08 0440 HL  0.44, therapeutic x 1 ?4/08 1039 HL0.38, thera x2 ?4/09 0533 HL 0.53, therapeutic x 3 ? ? ?Plan:  ?Continue heparin infusion at 1300 units/hr ?F/u HL w/ am labs ?Planning Heart Cath 05/12/21 ?CBC daily while on heparin. ? ?NRenda Rolls PharmD, MBA ?05/11/2021 ?6:42 AM ? ? ? ? ? ? ?

## 2021-05-11 NOTE — H&P (View-Only) (Signed)
? ?Progress Note ? ?Patient Name: [facility] ?Date of Encounter: 05/11/2021 ? ?[facility] Cardiologist: Ida Rogue, MD  ? ?Subjective  ? ?51 year old female with a history of coronary artery disease.  She presented with episodes of chest discomfort. ?Troponin levels were minimally elevated with a flat trend. ?Myoview study from yesterday revealed presence of ischemia in the mid and basal inferior walls. ? ?Had marked HTN yesterday , associated with CP and back pain  ?ECG showed mild ST depression  ?CTA of the chest was negative for aortic dissection .  ?She has a R sided arch . ?PAD  ?Troponins increased to 176. Marland Kitchen   Not c/w ACS  ?Feeling better.  ?Slight cp ? ?Creatinine is stable following the CT angio yesterday  ? ? ?Inpatient Medications  ?  ?Scheduled Meds: ? ALPRAZolam  1 mg Oral BID  ? amLODipine  10 mg Oral Daily  ? aspirin EC  81 mg Oral Daily  ? carvedilol  6.25 mg Oral BID  ? Chlorhexidine Gluconate Cloth  6 each Topical Daily  ? clopidogrel  75 mg Oral Daily  ? gabapentin  600 mg Oral BID  ? insulin aspart  0-6 Units Subcutaneous TID WC  ? lacosamide  200 mg Oral BID  ? levETIRAcetam  1,000 mg Oral BID  ? levothyroxine  25 mcg Oral QAC breakfast  ? mirabegron ER  50 mg Oral Daily  ? mirtazapine  7.5 mg Oral QHS  ? ofloxacin  1 drop Right Eye QID  ? pantoprazole  40 mg Oral BID  ? prednisoLONE acetate  1 drop Right Eye QID  ? rosuvastatin  40 mg Oral q1800  ? sucralfate  1 g Oral TID WC & HS  ? venlafaxine XR  75 mg Oral Q breakfast  ? ?Continuous Infusions: ? sodium chloride    ? heparin 1,300 Units/hr (05/11/21 0917)  ? nitroGLYCERIN 35 mcg/min (05/11/21 0600)  ? ?PRN Meds: ?acetaminophen, baclofen, diclofenac Sodium, HYDROmorphone (DILAUDID) injection, labetalol, morphine injection, nitroGLYCERIN, ondansetron (ZOFRAN) IV, polyvinyl alcohol, Rimegepant Sulfate  ? ?Vital Signs  ?  ?Vitals:  ? 05/11/21 0400 05/11/21 0500 05/11/21 0600 05/11/21 0630  ?BP: (!) 146/89 (!) 143/80 (!) 168/80  (!) 147/78  ?Pulse: (!) 58 60 (!) 57 62  ?Resp: '10 12 10 10  '$ ?Temp: 98 ?F (36.7 ?C)     ?TempSrc: Oral     ?SpO2: 98% 98% 100% 99%  ?Weight:      ?Height:      ? ? ?Intake/Output Summary (Last 24 hours) at 05/11/2021 0930 ?Last data filed at 05/11/2021 0920 ?Gross per 24 hour  ?Intake 1135.29 ml  ?Output 2825 ml  ?Net -1689.71 ml  ? ? ? ?  05/10/2021  ?  9:00 PM 05/08/2021  ? 12:56 PM 05/05/2021  ? 11:36 AM  ?Last 3 Weights  ?Weight (lbs) 153 lb 14.1 oz 165 lb 4.8 oz 159 lb  ?Weight (kg) 69.8 kg 74.98 kg 72.122 kg  ?   ? ?Telemetry  ?  ?NSR  - Personally Reviewed ? ?ECG  ?  ?NSR , mild ST depression yesterday ,  new ecg ordered for today   - Personally Reviewed ? ?Physical Exam  ? ?GEN: No acute distress.   ?Neck: No JVD ?Cardiac: RRR, no murmurs, rubs, or gallops.  ?Respiratory: Clear to auscultation bilaterally. ?GI: Soft, nontender, non-distended  ?MS: No edema; No deformity. ?Neuro:  Nonfocal  ?Psych: Normal affect  ? ?Labs  ?  ?High Sensitivity Troponin:   ?  Recent Labs  ?Lab 05/08/21 ?1300 05/08/21 ?1501 05/10/21 ?1831 05/10/21 ?2054 05/11/21 ?0533  ?TROPONINIHS 36* 35* 51* 89* 176*  ? ?   ?Chemistry ?Recent Labs  ?Lab 05/08/21 ?1300 05/10/21 ?9562 05/11/21 ?0533  ?NA 140 140 140  ?K 4.7 5.2* 4.2  ?CL 106 108 106  ?CO2 '27 26 26  '$ ?GLUCOSE 119* 92 107*  ?BUN 23* 29* 29*  ?CREATININE 1.57* 1.68* 1.42*  ?CALCIUM 8.8* 8.7* 8.8*  ?GFRNONAA 39* 36* 45*  ?ANIONGAP '7 6 8  '$ ? ?  ?Lipids No results for input(s): CHOL, TRIG, HDL, LABVLDL, LDLCALC, CHOLHDL in the last 168 hours.  ?Hematology ?Recent Labs  ?Lab 05/08/21 ?1300 05/10/21 ?1308 05/11/21 ?0533  ?WBC 8.2 8.6 8.8  ?RBC 4.40 4.16 3.91  ?HGB 11.4* 11.0* 10.2*  ?HCT 37.3 35.8* 32.6*  ?MCV 84.8 86.1 83.4  ?MCH 25.9* 26.4 26.1  ?MCHC 30.6 30.7 31.3  ?RDW 13.6 13.7 13.7  ?PLT 186 166 168  ? ? ?Thyroid No results for input(s): TSH, FREET4 in the last 168 hours.  ?BNPNo results for input(s): BNP, PROBNP in the last 168 hours.  ?DDimer No results for input(s): DDIMER in the last 168  hours.  ? ?Radiology  ?  ?NM Myocar Multi W/Spect W/Wall Motion / EF ? ?Result Date: 05/09/2021 ?  Findings are consistent with ischemia. The study is intermediate risk.   No ST deviation was noted.   LV perfusion is abnormal. There is evidence of ischemia. There is no evidence of infarction. Defect 1: There is a medium defect with moderate reduction in uptake present in the mid to basal inferior and inferolateral location(s) that is partially reversible. There is normal wall motion in the defect area. Consistent with ischemia.   Left ventricular function is abnormal. End diastolic cavity size is normal. End systolic cavity size is normal.   There is evidence of inferior wall ischemia but the study is suboptimal due to GI uptake.  ? ?CT Angio Chest/Abd/Pel for Dissection W and/or W/WO ? ?Result Date: 05/10/2021 ?CLINICAL DATA:  Chest or back pain, aortic dissection suspected. The patient has severe chest pain with blood pressure 213/112. No ST-elevation on EKG. EXAM: CT ANGIOGRAPHY CHEST, ABDOMEN AND PELVIS TECHNIQUE: Non-contrast CT of the chest was initially obtained. Multidetector CT imaging through the chest, abdomen and pelvis was performed using the standard protocol during bolus administration of intravenous contrast. Multiplanar reconstructed images and MIPs were obtained and reviewed to evaluate the vascular anatomy. RADIATION DOSE REDUCTION: This exam was performed according to the departmental dose-optimization program which includes automated exposure control, adjustment of the mA and/or kV according to patient size and/or use of iterative reconstruction technique. CONTRAST:  15m OMNIPAQUE IOHEXOL 350 MG/ML SOLN COMPARISON:  CTA chest 11/18/2019, abdomen and pelvis CT with contrast 12/05/2020 FINDINGS: CTA CHEST FINDINGS Cardiovascular: There is mild cardiomegaly with a left chamber predominance and no findings of acute right heart strain. There is no pericardial effusion. The pulmonary veins are  decompressed. There is patchy calcification in the LAD and circumflex coronary arteries trace calcification in the right coronary artery. Minimal aortic arch atherosclerosis with right-sided arch and descending segment. There is no aortic hematoma, aneurysm, dissection or penetrating ulcer or further significant plaques. Again noted is an aberrant left subclavian artery, embolized with a closure device with patent bypass graft from the posterolateral left common carotid artery to the left subclavian artery with good opacification of both subclavian arteries. The pulmonary arteries are normal in caliber without evidence of embolus to the  segmental level. Mediastinum/Nodes: The esophagus is again noted compressed between the aberrant left subclavian artery and trachea, above this the proximal thoracic esophagus is increasingly thickened. Below this the esophagus is mildly thickened and patulous but no more than previously. There is no thyroid mass, intrathoracic or axillary adenopathy. There are old surgical clips in the left superior mediastinum. The trachea is clear. Lungs/Pleura: Minimal layering left and trace right pleural effusions are new from prior studies. There is no pneumothorax. Accessory azygous lobe and fissure again noted medial right apex. Minimal centrilobular emphysema both lung apices. There is chronic elevation of the right hemidiaphragm, unchanged. Minimal atelectasis noted both posterior bases. There is scattered ground-glass disease in the posterior aspect of the right upper lobe and in the lateral aspect of the left upper lobe mid field, and in the superior segment of the left lower lobe. 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 Similar ground-glass disease was noted previously in the right upper lobe. Musculoskeletal: There are degenerative disc changes, spondylosis and Schmorl's nodes in the mid to lower thoracic spine but no destructive osseous process. The ribcage is intact. Review of  the MIP images confirms the above findings. CTA ABDOMEN AND PELVIS FINDINGS VASCULAR Aorta: There is minimal scattered calcific plaque without aneurysm, dissection, vasculitis or stenosis, or penetrating ulcer.

## 2021-05-11 NOTE — Progress Notes (Signed)
Date and time results received: 05/11/21  ?(use smartphrase ".now" to insert current time) ? ?Test: Troponin ? ?Critical Value: 176 ? ?Name of Provider Notified: Rachael Fee, NP ? ?Orders Received? Or Actions Taken?:  ? ?Repeat Troponin ordered ?
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
## 2021-05-11 NOTE — Assessment & Plan Note (Signed)
Patient had a significant chest pain, CT scan showed a thickened esophageal mucosa.  Chest pain could be partially caused by esophagitis.  Continue Protonix twice a day, also added sucralfate. ?Due to patient cardiac condition at this time, will not pursue any work-up in the hospital.  Patient be referred to GI after discharge. ?

## 2021-05-11 NOTE — Progress Notes (Signed)
? ?Progress Note ? ?Patient Name: [facility] ?Date of Encounter: 05/11/2021 ? ?[facility] Cardiologist: Ida Rogue, MD  ? ?Subjective  ? ?51 year old female with a history of coronary artery disease.  She presented with episodes of chest discomfort. ?Troponin levels were minimally elevated with a flat trend. ?Myoview study from yesterday revealed presence of ischemia in the mid and basal inferior walls. ? ?Had marked HTN yesterday , associated with CP and back pain  ?ECG showed mild ST depression  ?CTA of the chest was negative for aortic dissection .  ?She has a R sided arch . ?PAD  ?Troponins increased to 176. Marland Kitchen   Not c/w ACS  ?Feeling better.  ?Slight cp ? ?Creatinine is stable following the CT angio yesterday  ? ? ?Inpatient Medications  ?  ?Scheduled Meds: ? ALPRAZolam  1 mg Oral BID  ? amLODipine  10 mg Oral Daily  ? aspirin EC  81 mg Oral Daily  ? carvedilol  6.25 mg Oral BID  ? Chlorhexidine Gluconate Cloth  6 each Topical Daily  ? clopidogrel  75 mg Oral Daily  ? gabapentin  600 mg Oral BID  ? insulin aspart  0-6 Units Subcutaneous TID WC  ? lacosamide  200 mg Oral BID  ? levETIRAcetam  1,000 mg Oral BID  ? levothyroxine  25 mcg Oral QAC breakfast  ? mirabegron ER  50 mg Oral Daily  ? mirtazapine  7.5 mg Oral QHS  ? ofloxacin  1 drop Right Eye QID  ? pantoprazole  40 mg Oral BID  ? prednisoLONE acetate  1 drop Right Eye QID  ? rosuvastatin  40 mg Oral q1800  ? sucralfate  1 g Oral TID WC & HS  ? venlafaxine XR  75 mg Oral Q breakfast  ? ?Continuous Infusions: ? sodium chloride    ? heparin 1,300 Units/hr (05/11/21 0917)  ? nitroGLYCERIN 35 mcg/min (05/11/21 0600)  ? ?PRN Meds: ?acetaminophen, baclofen, diclofenac Sodium, HYDROmorphone (DILAUDID) injection, labetalol, morphine injection, nitroGLYCERIN, ondansetron (ZOFRAN) IV, polyvinyl alcohol, Rimegepant Sulfate  ? ?Vital Signs  ?  ?Vitals:  ? 05/11/21 0400 05/11/21 0500 05/11/21 0600 05/11/21 0630  ?BP: (!) 146/89 (!) 143/80 (!) 168/80  (!) 147/78  ?Pulse: (!) 58 60 (!) 57 62  ?Resp: '10 12 10 10  '$ ?Temp: 98 ?F (36.7 ?C)     ?TempSrc: Oral     ?SpO2: 98% 98% 100% 99%  ?Weight:      ?Height:      ? ? ?Intake/Output Summary (Last 24 hours) at 05/11/2021 0930 ?Last data filed at 05/11/2021 0920 ?Gross per 24 hour  ?Intake 1135.29 ml  ?Output 2825 ml  ?Net -1689.71 ml  ? ? ? ?  05/10/2021  ?  9:00 PM 05/08/2021  ? 12:56 PM 05/05/2021  ? 11:36 AM  ?Last 3 Weights  ?Weight (lbs) 153 lb 14.1 oz 165 lb 4.8 oz 159 lb  ?Weight (kg) 69.8 kg 74.98 kg 72.122 kg  ?   ? ?Telemetry  ?  ?NSR  - Personally Reviewed ? ?ECG  ?  ?NSR , mild ST depression yesterday ,  new ecg ordered for today   - Personally Reviewed ? ?Physical Exam  ? ?GEN: No acute distress.   ?Neck: No JVD ?Cardiac: RRR, no murmurs, rubs, or gallops.  ?Respiratory: Clear to auscultation bilaterally. ?GI: Soft, nontender, non-distended  ?MS: No edema; No deformity. ?Neuro:  Nonfocal  ?Psych: Normal affect  ? ?Labs  ?  ?High Sensitivity Troponin:   ?  Recent Labs  ?Lab 05/08/21 ?1300 05/08/21 ?1501 05/10/21 ?1831 05/10/21 ?2054 05/11/21 ?0533  ?TROPONINIHS 36* 35* 51* 89* 176*  ? ?   ?Chemistry ?Recent Labs  ?Lab 05/08/21 ?1300 05/10/21 ?0254 05/11/21 ?0533  ?NA 140 140 140  ?K 4.7 5.2* 4.2  ?CL 106 108 106  ?CO2 '27 26 26  '$ ?GLUCOSE 119* 92 107*  ?BUN 23* 29* 29*  ?CREATININE 1.57* 1.68* 1.42*  ?CALCIUM 8.8* 8.7* 8.8*  ?GFRNONAA 39* 36* 45*  ?ANIONGAP '7 6 8  '$ ? ?  ?Lipids No results for input(s): CHOL, TRIG, HDL, LABVLDL, LDLCALC, CHOLHDL in the last 168 hours.  ?Hematology ?Recent Labs  ?Lab 05/08/21 ?1300 05/10/21 ?2706 05/11/21 ?0533  ?WBC 8.2 8.6 8.8  ?RBC 4.40 4.16 3.91  ?HGB 11.4* 11.0* 10.2*  ?HCT 37.3 35.8* 32.6*  ?MCV 84.8 86.1 83.4  ?MCH 25.9* 26.4 26.1  ?MCHC 30.6 30.7 31.3  ?RDW 13.6 13.7 13.7  ?PLT 186 166 168  ? ? ?Thyroid No results for input(s): TSH, FREET4 in the last 168 hours.  ?BNPNo results for input(s): BNP, PROBNP in the last 168 hours.  ?DDimer No results for input(s): DDIMER in the last 168  hours.  ? ?Radiology  ?  ?NM Myocar Multi W/Spect W/Wall Motion / EF ? ?Result Date: 05/09/2021 ?  Findings are consistent with ischemia. The study is intermediate risk.   No ST deviation was noted.   LV perfusion is abnormal. There is evidence of ischemia. There is no evidence of infarction. Defect 1: There is a medium defect with moderate reduction in uptake present in the mid to basal inferior and inferolateral location(s) that is partially reversible. There is normal wall motion in the defect area. Consistent with ischemia.   Left ventricular function is abnormal. End diastolic cavity size is normal. End systolic cavity size is normal.   There is evidence of inferior wall ischemia but the study is suboptimal due to GI uptake.  ? ?CT Angio Chest/Abd/Pel for Dissection W and/or W/WO ? ?Result Date: 05/10/2021 ?CLINICAL DATA:  Chest or back pain, aortic dissection suspected. The patient has severe chest pain with blood pressure 213/112. No ST-elevation on EKG. EXAM: CT ANGIOGRAPHY CHEST, ABDOMEN AND PELVIS TECHNIQUE: Non-contrast CT of the chest was initially obtained. Multidetector CT imaging through the chest, abdomen and pelvis was performed using the standard protocol during bolus administration of intravenous contrast. Multiplanar reconstructed images and MIPs were obtained and reviewed to evaluate the vascular anatomy. RADIATION DOSE REDUCTION: This exam was performed according to the departmental dose-optimization program which includes automated exposure control, adjustment of the mA and/or kV according to patient size and/or use of iterative reconstruction technique. CONTRAST:  91m OMNIPAQUE IOHEXOL 350 MG/ML SOLN COMPARISON:  CTA chest 11/18/2019, abdomen and pelvis CT with contrast 12/05/2020 FINDINGS: CTA CHEST FINDINGS Cardiovascular: There is mild cardiomegaly with a left chamber predominance and no findings of acute right heart strain. There is no pericardial effusion. The pulmonary veins are  decompressed. There is patchy calcification in the LAD and circumflex coronary arteries trace calcification in the right coronary artery. Minimal aortic arch atherosclerosis with right-sided arch and descending segment. There is no aortic hematoma, aneurysm, dissection or penetrating ulcer or further significant plaques. Again noted is an aberrant left subclavian artery, embolized with a closure device with patent bypass graft from the posterolateral left common carotid artery to the left subclavian artery with good opacification of both subclavian arteries. The pulmonary arteries are normal in caliber without evidence of embolus to the  segmental level. Mediastinum/Nodes: The esophagus is again noted compressed between the aberrant left subclavian artery and trachea, above this the proximal thoracic esophagus is increasingly thickened. Below this the esophagus is mildly thickened and patulous but no more than previously. There is no thyroid mass, intrathoracic or axillary adenopathy. There are old surgical clips in the left superior mediastinum. The trachea is clear. Lungs/Pleura: Minimal layering left and trace right pleural effusions are new from prior studies. There is no pneumothorax. Accessory azygous lobe and fissure again noted medial right apex. Minimal centrilobular emphysema both lung apices. There is chronic elevation of the right hemidiaphragm, unchanged. Minimal atelectasis noted both posterior bases. There is scattered ground-glass disease in the posterior aspect of the right upper lobe and in the lateral aspect of the left upper lobe mid field, and in the superior segment of the left lower lobe. 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 Similar ground-glass disease was noted previously in the right upper lobe. Musculoskeletal: There are degenerative disc changes, spondylosis and Schmorl's nodes in the mid to lower thoracic spine but no destructive osseous process. The ribcage is intact. Review of  the MIP images confirms the above findings. CTA ABDOMEN AND PELVIS FINDINGS VASCULAR Aorta: There is minimal scattered calcific plaque without aneurysm, dissection, vasculitis or stenosis, or penetrating ulcer.

## 2021-05-11 NOTE — Progress Notes (Addendum)
?Progress Note ? ? ?Patient: [name] VEH:[id number] DOB: 01-21-1971 DOA: 05/08/2021     3 ?DOS: the patient was seen and examined on 05/11/2021 ?  ?Brief hospital course: ?[name] is a 51 year old female with history of coronary disease status post PCI, chronic congestive heart failure with normalized ejection fraction, chronic kidney disease stage IIIa, history of GI bleed, history of CVA, aortic arch aneurysm, type 2 diabetes, obesity status post gastric bypass surgery, bipolar disorder, essential hypertension, dyslipidemia, migraine headache who present to the hospital with chest pain. ?She had multiple falls for the last 71-month she also had a eye infection which she had a surgery 2 days ago.  Still taking antibiotics. ?Upon arriving the hospital, she has a peak troponin of 36. ? ?05/09/2021.  Nuclear stress test showed reverse ischemia.  Scheduled for heart cath on Monday. ?4/8.  Patient had severe chest pain in the evening.  Placed on nitro drip, continued on heparin.  CT angiogram ruled out aortic dissection. ? ?Assessment and Plan: ?Renal artery stenosis, native ([facility] ?Incidental finding of a left renal artery stenosis.  Patient will need to follow-up with vascular surgery after discharge from hospital. ? ?Esophagitis ?Patient had a significant chest pain, CT scan showed a thickened esophageal mucosa.  Chest pain could be partially caused by esophagitis.  Continue Protonix twice a day, also added sucralfate. ?Due to patient cardiac condition at this time, will not pursue any work-up in the hospital.  Patient be referred to GI after discharge. ? ?Chest pain in patient with underlying coronary artery disease ?Patient has been seen by cardiology, nuclear stress test was positive for ischemia.   On heparin drip, aspirin, Plavix and statin. ?Patient still has intermittent chest pain, had a severe pain last night, associated with significant hypertension.  Was placed on nitro drip.    ?Repeated troponin went up from 89 to 176.  Chest pain is still likely due to coronary artery disease with non-STEMI. ?Scheduled for coronary angiogram tomorrow.  Continue IV heparin, nitroglycerin. ?Also has significant esophagitis, could be partially responsible for chest pain.  CT angiogram has ruled out aortic dissection. ? ?Seizure ([place] ? Continue  Keppra and Vimpat ? ?Chronic [place] disease, stage 3b ([place] ?Renal function stable, will start IV fluids tonight for scheduled heart cath. ? ?Type II diabetes mellitus with renal manifestations ([place] ?Patient had episode of hypoglycemia 4/7.  Glucose has been stable since then.  Continue low-dose sliding scale insulin. ? ?Hypertension associated with stage 3 chronic kidney disease due to type 2 diabetes mellitus ([place] ?Continue amlodipine and Coreg ? ?NSTEMI (non-ST elevated myocardial infarction) ([place] ?Patient has elevated troponin up to 176 this morning.  Had a chest pain yesterday.  EKG did not show any ST elevation.  Treatment as above. ? ?History of CVA with residual deficit ? Continue statin, DAPT ? ? ? ?Hyperlipidemia ?--> Continue home statin, she is on high potency statin with rosuvastatin 40 mg daily and reports compliance with this ? ?Essential hypertension ? continue  amlodipine, carvedilol, ? ? ?Ground glass changes in the lungs ?Appeared on CT chest. I personally reviewed CT images, pretty mild. Negative respiratory pathogen panel, not consistent with bacterial pneumonia. Will follow for now.  ? ?  ? ?Subjective:  ?Patient had a severe chest pain last night, was given multiple dose of pain medicine, nitroglycerin drip, labetalol for elevated blood pressure. ?Patient feels better today, still has some pain in the chest, reproducible by palpitation to the sternum. ?No  shortness of breath ? ?Physical Exam: ?Vitals:  ? 05/11/21 0400 05/11/21 0500 05/11/21 0600 05/11/21 0630  ?BP: (!) 146/89 (!) 143/80 (!) 168/80 (!) 147/78  ?Pulse: (!) 58 60 (!) 57 62   ?Resp: '10 12 10 10  '$ ?Temp: 98 ?F (36.7 ?C)     ?TempSrc: Oral     ?SpO2: 98% 98% 100% 99%  ?Weight:      ?Height:      ? ?General exam: Appears calm and comfortable, morbid obesity ?Respiratory system: Clear to auscultation. Respiratory effort normal.  Chest wall tenderness. ?Cardiovascular system: S1 & S2 heard, RRR. No JVD, murmurs, rubs, gallops or clicks. No pedal edema. ?Gastrointestinal system: Abdomen is nondistended, soft and nontender. No organomegaly or masses felt. Normal bowel sounds heard. ?Central nervous system: Alert and oriented. No focal neurological deficits. ?Extremities: Symmetric 5 x 5 power. ?Skin: No rashes, lesions or ulcers ?Psychiatry: Judgement and insight appear normal. Mood & affect appropriate.  ? ?Data Reviewed: ? ?Reviewed all lab results. ?Reviewed the CT angiogram results. ? ?Family Communication: Son updated at bedside. ? ?Disposition: ?Status is: Inpatient ?Remains inpatient appropriate because: Severity of disease, IV treatment, pending inpatient procedure ? Planned Discharge Destination: Home with Home Health ? ? ? ?Time spent: 37 minutes ? ?Author: ?Sharen Hones, MD ?05/11/2021 10:44 AM ? ?For on call review [email or web address].  ?

## 2021-05-12 ENCOUNTER — Ambulatory Visit: Payer: Medicare Other | Admitting: Cardiovascular Disease

## 2021-05-12 ENCOUNTER — Other Ambulatory Visit: Payer: Self-pay

## 2021-05-12 ENCOUNTER — Encounter
Admission: EM | Disposition: A | Discharge status: Home / Self Care | Payer: Self-pay | Source: Home / Self Care | Attending: Internal Medicine

## 2021-05-12 ENCOUNTER — Encounter: Payer: Self-pay | Admitting: Internal Medicine

## 2021-05-12 DIAGNOSIS — N179 Acute kidney failure, unspecified: Secondary | ICD-10-CM

## 2021-05-12 DIAGNOSIS — I25118 Atherosclerotic heart disease of native coronary artery with other forms of angina pectoris: Secondary | ICD-10-CM

## 2021-05-12 DIAGNOSIS — N189 Chronic kidney disease, unspecified: Secondary | ICD-10-CM

## 2021-05-12 DIAGNOSIS — E782 Mixed hyperlipidemia: Secondary | ICD-10-CM

## 2021-05-12 DIAGNOSIS — I5022 Chronic systolic (congestive) heart failure: Secondary | ICD-10-CM

## 2021-05-12 DIAGNOSIS — I2510 Atherosclerotic heart disease of native coronary artery without angina pectoris: Secondary | ICD-10-CM

## 2021-05-12 DIAGNOSIS — I10 Essential (primary) hypertension: Secondary | ICD-10-CM

## 2021-05-12 DIAGNOSIS — N183 Chronic kidney disease, stage 3 unspecified: Secondary | ICD-10-CM

## 2021-05-12 HISTORY — PX: CORONARY STENT INTERVENTION: CATH118234

## 2021-05-12 HISTORY — PX: LEFT HEART CATH AND CORONARY ANGIOGRAPHY: CATH118249

## 2021-05-12 LAB — GLUCOSE, CAPILLARY
Glucose-Capillary: 136 mg/dL — ABNORMAL HIGH (ref 70–99)
Glucose-Capillary: 88 mg/dL (ref 70–99)
Glucose-Capillary: 107 mg/dL — ABNORMAL HIGH (ref 70–99)
Glucose-Capillary: 165 mg/dL — ABNORMAL HIGH (ref 70–99)
Glucose-Capillary: 249 mg/dL — ABNORMAL HIGH (ref 70–99)
Glucose-Capillary: 94 mg/dL (ref 70–99)

## 2021-05-12 LAB — CBC
HCT: 32.9 % — ABNORMAL LOW (ref 36.0–46.0)
Hemoglobin: 10.2 g/dL — ABNORMAL LOW (ref 12.0–15.0)
MCH: 26.4 pg (ref 26.0–34.0)
MCHC: 31 g/dL (ref 30.0–36.0)
MCV: 85 fL (ref 80.0–100.0)
Platelets: 159 10*3/uL (ref 150–400)
RBC: 3.87 MIL/uL (ref 3.87–5.11)
RDW: 14 % (ref 11.5–15.5)
WBC: 9.7 10*3/uL (ref 4.0–10.5)
nRBC: 0 % (ref 0.0–0.2)

## 2021-05-12 LAB — BASIC METABOLIC PANEL
Anion gap: 9 (ref 5–15)
BUN: 36 mg/dL — ABNORMAL HIGH (ref 6–20)
CO2: 26 mmol/L (ref 22–32)
Calcium: 8.5 mg/dL — ABNORMAL LOW (ref 8.9–10.3)
Chloride: 106 mmol/L (ref 98–111)
Creatinine, Ser: 1.84 mg/dL — ABNORMAL HIGH (ref 0.44–1.00)
GFR, Estimated: 33 mL/min — ABNORMAL LOW (ref >60)
Glucose, Bld: 123 mg/dL — ABNORMAL HIGH (ref 70–99)
Potassium: 4.4 mmol/L (ref 3.5–5.1)
Sodium: 141 mmol/L (ref 135–145)

## 2021-05-12 LAB — MAGNESIUM: Magnesium: 1.8 mg/dL (ref 1.7–2.4)

## 2021-05-12 LAB — HEPARIN LEVEL (UNFRACTIONATED): Heparin Unfractionated: 0.45 IU/mL (ref 0.30–0.70)

## 2021-05-12 LAB — POCT ACTIVATED CLOTTING TIME: Activated Clotting Time: 287 seconds

## 2021-05-12 SURGERY — LEFT HEART CATH AND CORONARY ANGIOGRAPHY
Anesthesia: Moderate Sedation

## 2021-05-12 MED ORDER — MIDAZOLAM HCL 2 MG/2ML IJ SOLN
INTRAMUSCULAR | Status: AC
  Filled 2021-05-12: qty 2

## 2021-05-12 MED ORDER — NITROGLYCERIN 1 MG/10 ML FOR IR/CATH LAB
INTRA_ARTERIAL | Status: AC
  Filled 2021-05-12: qty 10

## 2021-05-12 MED ORDER — HEPARIN SODIUM (PORCINE) 1000 UNIT/ML IJ SOLN
INTRAMUSCULAR | Status: AC
  Filled 2021-05-12: qty 10

## 2021-05-12 MED ORDER — NITROGLYCERIN 1 MG/10 ML FOR IR/CATH LAB
INTRA_ARTERIAL | Status: DC | PRN
  Administered 2021-05-12 (×2): 200 ug via INTRACORONARY

## 2021-05-12 MED ORDER — CLOPIDOGREL BISULFATE 75 MG PO TABS
ORAL_TABLET | ORAL | Status: DC | PRN
  Administered 2021-05-12: 300 mg via ORAL

## 2021-05-12 MED ORDER — SODIUM CHLORIDE 0.9 % IV SOLN: 250.0000 mL | INTRAVENOUS | Status: DC | PRN

## 2021-05-12 MED ORDER — VERAPAMIL HCL 2.5 MG/ML IV SOLN
INTRAVENOUS | Status: AC
Start: 2021-05-12 — End: ?
  Filled 2021-05-12: qty 2

## 2021-05-12 MED ORDER — SODIUM CHLORIDE 0.9 % IV SOLN: INTRAVENOUS | Status: DC

## 2021-05-12 MED ORDER — FENTANYL CITRATE (PF) 100 MCG/2ML IJ SOLN
INTRAMUSCULAR | Status: AC
Start: 2021-05-12 — End: ?
  Filled 2021-05-12: qty 2

## 2021-05-12 MED ORDER — FENTANYL CITRATE (PF) 100 MCG/2ML IJ SOLN
INTRAMUSCULAR | Status: DC | PRN
  Administered 2021-05-12 (×2): 50 ug via INTRAVENOUS

## 2021-05-12 MED ORDER — SODIUM CHLORIDE 0.9% FLUSH
3.0000 mL | Freq: Two times a day (BID) | INTRAVENOUS | Status: DC
  Administered 2021-05-12 (×2): 3 mL via INTRAVENOUS

## 2021-05-12 MED ORDER — SODIUM CHLORIDE 0.9% FLUSH: 3.0000 mL | INTRAVENOUS | Status: DC | PRN

## 2021-05-12 MED ORDER — VERAPAMIL HCL 2.5 MG/ML IV SOLN
INTRAVENOUS | Status: DC | PRN
  Administered 2021-05-12 (×2): 2.5 mg via INTRA_ARTERIAL

## 2021-05-12 MED ORDER — CLOPIDOGREL BISULFATE 75 MG PO TABS
75.0000 mg | ORAL_TABLET | Freq: Every day | ORAL | Status: DC
Start: 2021-05-13 — End: 2021-05-13
  Administered 2021-05-13: 75 mg via ORAL
  Filled 2021-05-12: qty 1

## 2021-05-12 MED ORDER — CLOPIDOGREL BISULFATE 300 MG PO TABS
ORAL_TABLET | ORAL | Status: AC
Start: 2021-05-12 — End: ?
  Filled 2021-05-12: qty 1

## 2021-05-12 MED ORDER — MIDAZOLAM HCL 2 MG/2ML IJ SOLN
INTRAMUSCULAR | Status: DC | PRN
Start: 2021-05-12 — End: 2021-05-12
  Administered 2021-05-12: 1 mg via INTRAVENOUS

## 2021-05-12 MED ORDER — IOHEXOL 300 MG/ML  SOLN
INTRAMUSCULAR | Status: DC | PRN
  Administered 2021-05-12: 45 mL

## 2021-05-12 MED ORDER — SODIUM CHLORIDE 0.9% FLUSH
3.0000 mL | Freq: Two times a day (BID) | INTRAVENOUS | Status: DC
Start: 2021-05-12 — End: 2021-05-12

## 2021-05-12 MED ORDER — HEPARIN (PORCINE) IN NACL 2000-0.9 UNIT/L-% IV SOLN
INTRAVENOUS | Status: DC | PRN
  Administered 2021-05-12: 1000 mL

## 2021-05-12 MED ORDER — ISOSORBIDE MONONITRATE ER 30 MG PO TB24
30.0000 mg | ORAL_TABLET | Freq: Every day | ORAL | Status: DC
  Administered 2021-05-12: 30 mg via ORAL
  Filled 2021-05-12: qty 1

## 2021-05-12 MED ORDER — HEPARIN (PORCINE) IN NACL 1000-0.9 UT/500ML-% IV SOLN
INTRAVENOUS | Status: AC
Start: 2021-05-12 — End: ?
  Filled 2021-05-12: qty 1000

## 2021-05-12 MED ORDER — LIDOCAINE HCL 1 % IJ SOLN
INTRAMUSCULAR | Status: AC
  Filled 2021-05-12: qty 20

## 2021-05-12 MED ORDER — SODIUM CHLORIDE 0.9 % IV SOLN: INTRAVENOUS | Status: AC

## 2021-05-12 MED ORDER — HYDRALAZINE HCL 20 MG/ML IJ SOLN: 10.0000 mg | INTRAMUSCULAR | Status: AC | PRN

## 2021-05-12 MED ORDER — SODIUM CHLORIDE 0.9 % IV SOLN
250.0000 mL | INTRAVENOUS | Status: DC | PRN
Start: 2021-05-12 — End: 2021-05-13

## 2021-05-12 MED ORDER — HEPARIN SODIUM (PORCINE) 5000 UNIT/ML IJ SOLN
5000.0000 [IU] | Freq: Three times a day (TID) | INTRAMUSCULAR | Status: DC
  Administered 2021-05-12 – 2021-05-13 (×2): 5000 [IU] via SUBCUTANEOUS
  Filled 2021-05-12 (×2): qty 1

## 2021-05-12 MED ORDER — HEPARIN SODIUM (PORCINE) 1000 UNIT/ML IJ SOLN
INTRAMUSCULAR | Status: DC | PRN
Start: 2021-05-12 — End: 2021-05-12
  Administered 2021-05-12 (×2): 3500 [IU] via INTRAVENOUS

## 2021-05-12 SURGICAL SUPPLY — 20 items
BALLN TREK RX 2.5X8 (BALLOONS) ×2
BALLN ~~LOC~~ TREK RX 3.25X12 (BALLOONS) ×2
BALLOON TREK RX 2.5X8 (BALLOONS) IMPLANT
BALLOON ~~LOC~~ TREK RX 3.25X12 (BALLOONS) IMPLANT
CATH 5FR JL3.5 JR4 ANG PIG MP (CATHETERS) ×1 IMPLANT
CATH LAUNCHER 5F JR4 (CATHETERS) ×1 IMPLANT
CATH VISTA GUIDE 6FR MPA1 (CATHETERS) ×1 IMPLANT
DEVICE RAD TR BAND REGULAR (VASCULAR PRODUCTS) ×1 IMPLANT
DRAPE BRACHIAL (DRAPES) ×1 IMPLANT
GLIDESHEATH SLEND SS 6F .021 (SHEATH) ×1 IMPLANT
GUIDEWIRE INQWIRE 1.5J.035X260 (WIRE) IMPLANT
INQWIRE 1.5J .035X260CM (WIRE) ×2
KIT ENCORE 26 ADVANTAGE (KITS) ×1 IMPLANT
KIT SYRINGE INJ CVI SPIKEX1 (MISCELLANEOUS) ×1 IMPLANT
PACK CARDIAC CATH (CUSTOM PROCEDURE TRAY) ×2 IMPLANT
PROTECTION STATION PRESSURIZED (MISCELLANEOUS) ×2
SET ATX SIMPLICITY (MISCELLANEOUS) ×1 IMPLANT
STATION PROTECTION PRESSURIZED (MISCELLANEOUS) IMPLANT
STENT ONYX FRONTIER 3.0X15 (Permanent Stent) ×1 IMPLANT
WIRE RUNTHROUGH .014X180CM (WIRE) ×1 IMPLANT

## 2021-05-12 NOTE — Progress Notes (Signed)
?Progress Note ? ? ?Patient: [name] OIN:[id number] DOB: 1970-11-04 DOA: 05/08/2021     4 ?DOS: the patient was seen and examined on 05/12/2021 ?  ?Brief hospital course: ?[name] is a 51 year old female with history of coronary disease status post PCI, chronic congestive heart failure with normalized ejection fraction, chronic kidney disease stage IIIa, history of GI bleed, history of CVA, aortic arch aneurysm, type 2 diabetes, obesity status post gastric bypass surgery, bipolar disorder, essential hypertension, dyslipidemia, migraine headache who present to the hospital with chest pain. ?She had multiple falls for the last 24-month she also had a eye infection which she had a surgery 2 days ago.  Still taking antibiotics. ?Upon arriving the hospital, she has a peak troponin of 36. ? ?05/09/2021.  Nuclear stress test showed reverse ischemia.  Scheduled for heart cath on Monday. ?4/8.  Patient had severe chest pain in the evening.  Placed on nitro drip, continued on heparin.  CT angiogram ruled out aortic dissection. ? ?Assessment and Plan: ?Renal artery stenosis, native ([facility] ?Incidental finding of a left renal artery stenosis.  Patient will need to follow-up with vascular surgery after discharge from hospital. ? ?Esophagitis ?Patient had a significant chest pain, CT scan showed a thickened esophageal mucosa.  Chest pain could be partially caused by esophagitis.  Continue Protonix twice a day, also added sucralfate. ?Due to patient cardiac condition at this time, will not pursue any work-up in the hospital.  Patient be referred to GI after discharge. ? ?Chest pain in patient with underlying coronary artery disease ?Appear to be secondary to coronary artery disease. ? ?[place] ([place] ? Continue  Keppra and Vimpat ? ?Chronic kidney disease, stage 3b ([place] ?Patient had a slightly worsening renal function before the heart cath.  Currently receiving IV fluids.  Recheck BMP tomorrow. ? ?Type II  diabetes mellitus with renal manifestations ([place] ?Glucose well controlled, continue sliding scale insulin ? ?Hypertension associated with stage 3 chronic kidney disease due to type 2 diabetes mellitus ([place] ?Continue amlodipine and Coreg ? ?NSTEMI (non-ST elevated myocardial infarction) ([place] ?Cardiac cath showed severe two-vessel coronary disease with 95% distal RCA stenosis as well as multisegmental distal LAD disease as up to 70%, 90% ostial D2 stenosis. ?Patient has a drug-eluting stent placed in RCA. ?Chest pain since has resolved. ? ?History of CVA with residual deficit ? Continue statin, DAPT ? ? ? ?Hyperlipidemia ?--> Continue home statin, she is on high potency statin with rosuvastatin 40 mg daily and reports compliance with this ? ?Essential hypertension ? continue  amlodipine, carvedilol, ? ? ? ? ?  ? ?Subjective:  ?Patient doing much better after heart cath.  Chest pain finally relieved.  No shortness of breath ? ?Physical Exam: ?Vitals:  ? 05/12/21 0930 05/12/21 1000 05/12/21 1100 05/12/21 1200  ?BP: 140/70 (!) 142/73 112/66 127/70  ?Pulse: 70 60 (!) 58 (!) 57  ?Resp: 15 14 (!) 9 14  ?Temp:  98.4 ?F (36.9 ?C)  98.2 ?F (36.8 ?C)  ?TempSrc:  Oral  Oral  ?SpO2: 98% 100% 100% 98%  ?Weight:      ?Height:      ? ?General exam: Appears calm and comfortable  ?Respiratory system: Clear to auscultation. Respiratory effort normal. ?Cardiovascular system: S1 & S2 heard, RRR. No JVD, murmurs, rubs, gallops or clicks. No pedal edema. ?Gastrointestinal system: Abdomen is nondistended, soft and nontender. No organomegaly or masses felt. Normal bowel sounds heard. ?Central nervous system: Alert and oriented. No focal neurological  deficits. ?Extremities: Symmetric 5 x 5 power. ?Skin: No rashes, lesions or ulcers ?Psychiatry: Judgement and insight appear normal. Mood & affect appropriate.  ? ?Data Reviewed: ? ?Heart cath results reviewed. ?All labs reviewed. ? ?Family Communication:  ? ?Disposition: ?Status is:  Inpatient ?Remains inpatient appropriate because: Severity of disease.  IV fluids ? Planned Discharge Destination: Home with Home Health ? ? ? ?Time spent: 28 minutes ? ?Author: ?Sharen Hones, MD ?05/12/2021 1:04 PM ? ?For on call review [email or web address]  ?

## 2021-05-12 NOTE — Progress Notes (Signed)
? ?Progress Note ? ?Patient Name: [name] ?Date of Encounter: 05/12/2021 ? ?[facility] Cardiologist: Ida Rogue, MD  ? ?Subjective  ? ?Patient with continued intermittent severe chest pain in the center of the chest radiating to the back yesterday and overnight, requiring simultaneous morphine and hydromorphone at 1 point.  She denies shortness of breath. ? ?Inpatient Medications  ?  ?Scheduled Meds: ? [MAR Hold] ALPRAZolam  1 mg Oral BID  ? [MAR Hold] amLODipine  10 mg Oral Daily  ? [MAR Hold] aspirin EC  81 mg Oral Daily  ? [MAR Hold] carvedilol  25 mg Oral BID  ? [MAR Hold] Chlorhexidine Gluconate Cloth  6 each Topical Daily  ? [START ON 05/13/2021] clopidogrel  75 mg Oral Daily  ? [MAR Hold] gabapentin  600 mg Oral BID  ? heparin  5,000 Units Subcutaneous Q8H  ? [MAR Hold] insulin aspart  0-6 Units Subcutaneous TID WC  ? isosorbide mononitrate  30 mg Oral Daily  ? [MAR Hold] lacosamide  200 mg Oral BID  ? [MAR Hold] levETIRAcetam  1,000 mg Oral BID  ? [MAR Hold] levothyroxine  25 mcg Oral QAC breakfast  ? [MAR Hold] mirabegron ER  50 mg Oral Daily  ? [MAR Hold] mirtazapine  7.5 mg Oral QHS  ? [MAR Hold] ofloxacin  1 drop Right Eye QID  ? [MAR Hold] pantoprazole  40 mg Oral BID  ? [MAR Hold] prednisoLONE acetate  1 drop Right Eye QID  ? [MAR Hold] rosuvastatin  40 mg Oral q1800  ? sodium chloride flush  3 mL Intravenous Q12H  ? [MAR Hold] sucralfate  1 g Oral TID WC & HS  ? [MAR Hold] venlafaxine XR  75 mg Oral Q breakfast  ? ?Continuous Infusions: ? sodium chloride 50 mL/hr at 05/12/21 0919  ? sodium chloride    ? ?PRN Meds: ?sodium chloride, [MAR Hold] acetaminophen, [MAR Hold] baclofen, [MAR Hold] diphenhydrAMINE, hydrALAZINE, [MAR Hold]  HYDROmorphone (DILAUDID) injection, [MAR Hold] labetalol, [MAR Hold] nitroGLYCERIN, [MAR Hold] ondansetron (ZOFRAN) IV, [MAR Hold] polyvinyl alcohol, [MAR Hold] Rimegepant Sulfate, sodium chloride flush  ? ?Vital Signs  ?  ?Vitals:  ? 05/12/21 0630  05/12/21 0710 05/12/21 0900 05/12/21 0915  ?BP: 126/74 123/80 140/87 (!) 150/81  ?Pulse: 66 73 66 66  ?Resp: '12 18 17 10  '$ ?Temp:  97.7 ?F (36.5 ?C)    ?TempSrc:  Oral    ?SpO2: 100% 98% 99% 98%  ?Weight:      ?Height:      ? ? ?Intake/Output Summary (Last 24 hours) at 05/12/2021 0926 ?Last data filed at 05/12/2021 0600 ?Gross per 24 hour  ?Intake 1250.73 ml  ?Output 850 ml  ?Net 400.73 ml  ? ? ?  05/10/2021  ?  9:00 PM 05/08/2021  ? 12:56 PM 05/05/2021  ? 11:36 AM  ?Last 3 Weights  ?Weight (lbs) 153 lb 14.1 oz 165 lb 4.8 oz 159 lb  ?Weight (kg) 69.8 kg 74.98 kg 72.122 kg  ?   ? ?Telemetry  ?  ?Sinus rhythm in specials.  Floor telemetry not available for review. - Personally Reviewed ? ?ECG  ?  ?Normal sinus rhythm with borderline LVH - Personally Reviewed ? ?Physical Exam  ? ?GEN: No acute distress.   ?Neck: No JVD ?HEENT: Right eye with injected conjunctiva and clear patch covering the right face ?Cardiac: RRR, no murmurs, rubs, or gallops.  ?Respiratory: Clear to auscultation bilaterally. ?GI: Soft, nontender, non-distended  ?MS: No edema; No deformity. ?  Neuro:  Nonfocal  ?Psych: Normal affect  ? ?Labs  ?  ?High Sensitivity Troponin:   ?Recent Labs  ?Lab 05/08/21 ?1501 05/10/21 ?1831 05/10/21 ?2054 05/11/21 ?4174 05/11/21 ?1057  ?TROPONINIHS 35* 51* 89* 176* 96*  ?   ?Chemistry ?Recent Labs  ?Lab 05/10/21 ?0814 05/11/21 ?4818 05/12/21 ?5631  ?NA 140 140 141  ?K 5.2* 4.2 4.4  ?CL 108 106 106  ?CO2 '26 26 26  '$ ?GLUCOSE 92 107* 123*  ?BUN 29* 29* 36*  ?CREATININE 1.68* 1.42* 1.84*  ?CALCIUM 8.7* 8.8* 8.5*  ?MG  --   --  1.8  ?GFRNONAA 36* 45* 33*  ?ANIONGAP '6 8 9  '$ ?  ?Lipids No results for input(s): CHOL, TRIG, HDL, LABVLDL, LDLCALC, CHOLHDL in the last 168 hours.  ?Hematology ?Recent Labs  ?Lab 05/10/21 ?4970 05/11/21 ?2637 05/12/21 ?8588  ?WBC 8.6 8.8 9.7  ?RBC 4.16 3.91 3.87  ?HGB 11.0* 10.2* 10.2*  ?HCT 35.8* 32.6* 32.9*  ?MCV 86.1 83.4 85.0  ?MCH 26.4 26.1 26.4  ?MCHC 30.7 31.3 31.0  ?RDW 13.7 13.7 14.0  ?PLT 166 168 159   ? ?Thyroid No results for input(s): TSH, FREET4 in the last 168 hours.  ?BNPNo results for input(s): BNP, PROBNP in the last 168 hours.  ?DDimer No results for input(s): DDIMER in the last 168 hours.  ? ?Radiology  ?  ?CARDIAC CATHETERIZATION ? ?Result Date: 05/12/2021 ?Conclusions: Severe two-vessel coronary artery disease with 95% distal RCA stenosis as well as multisegmental distal LAD disease of up to 70% and 90% ostial D2 stenosis. Moderate, noncritical mid RCA and proximal LCx disease. Patent D1 and distal LAD stents.  The distal LAD stent demonstrates up to 30% eccentric in-stent restenosis. Mildly elevated left ventricular filling pressure (LVEDP 15-20 mmHg). Successful PCI to distal RCA using Onyx Frontier 3.0 x 15 mm drug-eluting stent with 0% residual stenosis and TIMI-3 flow. Coronary vasospasm versus relatively small caliber of right radial artery precluding advancement of 10F guide catheter.  Intervention successfully performed using 18F JR4 guide catheter.  Alternative access may need to be considered for future interventions. Recommendations: Continue indefinite dual antiplatelet therapy with aspirin and clopidogrel. Gentle post catheterization hydration to maintain net even fluid balance given acute kidney injury superimposed on chronic kidney disease and mildly elevated LVEDP during catheterization today. Aggressive secondary prevention of coronary artery disease. Nelva Bush, MD [facility] ? ?CT Angio Chest/Abd/Pel for Dissection W and/or W/WO ? ?Result Date: 05/10/2021 ?CLINICAL DATA:  Chest or back pain, aortic dissection suspected. The patient has severe chest pain with blood pressure 213/112. No ST-elevation on EKG. EXAM: CT ANGIOGRAPHY CHEST, ABDOMEN AND PELVIS TECHNIQUE: Non-contrast CT of the chest was initially obtained. Multidetector CT imaging through the chest, abdomen and pelvis was performed using the standard protocol during bolus administration of intravenous contrast.  Multiplanar reconstructed images and MIPs were obtained and reviewed to evaluate the vascular anatomy. RADIATION DOSE REDUCTION: This exam was performed according to the departmental dose-optimization program which includes automated exposure control, adjustment of the mA and/or kV according to patient size and/or use of iterative reconstruction technique. CONTRAST:  70m OMNIPAQUE IOHEXOL 350 MG/ML SOLN COMPARISON:  CTA chest 11/18/2019, abdomen and pelvis CT with contrast 12/05/2020 FINDINGS: CTA CHEST FINDINGS Cardiovascular: There is mild cardiomegaly with a left chamber predominance and no findings of acute right heart strain. There is no pericardial effusion. The pulmonary veins are decompressed. There is patchy calcification in the LAD and circumflex coronary arteries trace calcification in the right coronary artery.  Minimal aortic arch atherosclerosis with right-sided arch and descending segment. There is no aortic hematoma, aneurysm, dissection or penetrating ulcer or further significant plaques. Again noted is an aberrant left subclavian artery, embolized with a closure device with patent bypass graft from the posterolateral left common carotid artery to the left subclavian artery with good opacification of both subclavian arteries. The pulmonary arteries are normal in caliber without evidence of embolus to the segmental level. Mediastinum/Nodes: The esophagus is again noted compressed between the aberrant left subclavian artery and trachea, above this the proximal thoracic esophagus is increasingly thickened. Below this the esophagus is mildly thickened and patulous but no more than previously. There is no thyroid mass, intrathoracic or axillary adenopathy. There are old surgical clips in the left superior mediastinum. The trachea is clear. Lungs/Pleura: Minimal layering left and trace right pleural effusions are new from prior studies. There is no pneumothorax. Accessory azygous lobe and fissure again  noted medial right apex. Minimal centrilobular emphysema both lung apices. There is chronic elevation of the right hemidiaphragm, unchanged. Minimal atelectasis noted both posterior bases. There is scattered ground-gl

## 2021-05-12 NOTE — Interval H&P Note (Signed)
History and Physical Interval Note: ? ?05/12/2021 ?7:35 AM ? ?[name]  has presented today for surgery, with the diagnosis of unstable angina and abnormal stress test.  The various methods of treatment have been discussed with the patient and family. After consideration of risks, benefits and other options for treatment, the patient has consented to  Procedure(s): ?LEFT HEART CATH AND CORONARY ANGIOGRAPHY (N/A) as a surgical intervention.  The patient's history has been reviewed, patient examined, no change in status, stable for surgery.  I have reviewed the patient's chart and labs.  Questions were answered to the patient's satisfaction.   ? ?Cath Lab Visit (complete for each Cath Lab visit) ? ?Clinical Evaluation Leading to the Procedure:  ? ?ACS: Yes.   ? ?Non-ACS:  N/A ? ?[name] ? ? ?

## 2021-05-13 LAB — CBC
HCT: 31.2 % — ABNORMAL LOW (ref 36.0–46.0)
Hemoglobin: 9.7 g/dL — ABNORMAL LOW (ref 12.0–15.0)
MCH: 26.6 pg (ref 26.0–34.0)
MCHC: 31.1 g/dL (ref 30.0–36.0)
MCV: 85.5 fL (ref 80.0–100.0)
Platelets: 144 10*3/uL — ABNORMAL LOW (ref 150–400)
RBC: 3.65 MIL/uL — ABNORMAL LOW (ref 3.87–5.11)
RDW: 13.8 % (ref 11.5–15.5)
WBC: 9.1 10*3/uL (ref 4.0–10.5)
nRBC: 0 % (ref 0.0–0.2)

## 2021-05-13 LAB — GLUCOSE, CAPILLARY: Glucose-Capillary: 99 mg/dL (ref 70–99)

## 2021-05-13 LAB — BASIC METABOLIC PANEL
Anion gap: 7 (ref 5–15)
BUN: 35 mg/dL — ABNORMAL HIGH (ref 6–20)
CO2: 25 mmol/L (ref 22–32)
Calcium: 8.4 mg/dL — ABNORMAL LOW (ref 8.9–10.3)
Chloride: 112 mmol/L — ABNORMAL HIGH (ref 98–111)
Creatinine, Ser: 1.6 mg/dL — ABNORMAL HIGH (ref 0.44–1.00)
GFR, Estimated: 39 mL/min — ABNORMAL LOW (ref >60)
Glucose, Bld: 111 mg/dL — ABNORMAL HIGH (ref 70–99)
Potassium: 4.3 mmol/L (ref 3.5–5.1)
Sodium: 144 mmol/L (ref 135–145)

## 2021-05-13 MED ORDER — ISOSORBIDE MONONITRATE ER 60 MG PO TB24
120.0000 mg | ORAL_TABLET | Freq: Every day | ORAL | Status: DC
Start: 2021-05-13 — End: 2021-05-13
  Administered 2021-05-13: 120 mg via ORAL
  Filled 2021-05-13: qty 2

## 2021-05-13 MED ORDER — CARVEDILOL 25 MG PO TABS: 25.0000 mg | ORAL_TABLET | Freq: Two times a day (BID) | ORAL | 0 refills | Status: DC

## 2021-05-13 NOTE — Discharge Instructions (Signed)
Please follow-up with GI Dr. Allen Norris in 2 weeks. ?Please follow-up with Dr. Lucky Cowboy for 2 weeks ?

## 2021-05-13 NOTE — TOC Transition Note (Signed)
Transition of Care (TOC) - CM/SW Discharge Note ? ? ?Patient Details  ?Name: [name] ?MRN: [id number] ?Date of Birth: March 23, 1970 ? ?Transition of Care (TOC) CM/SW Contact:  ?Shelbie Hutching, RN ?Phone Number: ?05/13/2021, 11:02 AM ? ? ?Clinical Narrative:    ?Patient medically cleared for discharge home with home health services through [facility].  Merleen Nicely with [facility] notified that patient discharging.  Patient's son is here to transport her home.  ? ? ?Final next level of care: [place] ?Barriers to Discharge: Barriers Resolved ? ? ?Patient Goals and CMS Choice ?Patient states their goals for this hospitalization and ongoing recovery are:: to get back home ?CMS Medicare.gov Compare Post Acute Care list provided to:: Patient ?Choice offered to / list presented to : Patient ? ?Discharge Placement ?  ?           ?  ?  ?  ?  ? ?Discharge Plan and Services ?  ?Discharge Planning Services: CM Consult ?Post Acute Care Choice: Resumption of Svcs/PTA Provider (PT/OT)          ?DME Arranged: N/A ?DME Agency: NA ?  ?  ?  ?HH Arranged: RN, PT, OT ?[place] Agency: [facility] ?Date HH Agency Contacted: 05/13/21 ?Time [place]: 1102 ?Representative spoke with at [place]: [place] ? ?Social Determinants of Health (SDOH) Interventions ?  ? ? ?Readmission Risk Interventions ? ?  05/09/2021  ?  2:17 PM 11/07/2019  ?  4:28 PM 10/18/2019  ?  1:55 PM  ?Readmission Risk Prevention Plan  ?Transportation Screening Complete Complete Complete  ?Medication Review Press photographer) Complete Complete Complete  ?PCP or Specialist appointment within 3-5 days of discharge   Complete  ?[place] or Home Care Consult Complete Complete Complete  ?SW Recovery Care/Counseling Consult   Complete  ?Palliative Care Screening Not Applicable Not Applicable Not Applicable  ?[place] Not Applicable Not Applicable Not Applicable  ? ? ? ? ? ?

## 2021-05-13 NOTE — Progress Notes (Signed)
Patient in bed resting, endorses difficulty sleeping overnight but no other complaints. Patient explains plans for discharge have been discussed for today and explains that son will be transporting her home. Once discharge orders in place, discharge paperwork printed and reviewed with patient. Spoke with TOC and patient is okay to discharge, [place] arrangements in progress. Patient and son state the patient manages her medications, medication changes and schedule reviewed. All questions and concerns addressed. Patient requesting son to assist with changing into home clothing. Follow up appointments per discharge summary.  IV x2 and monitoring equipment removed. Patient son assisted patient to change and into recliner. Patient discharged with all belongings by wheelchair.  ?

## 2021-05-13 NOTE — Discharge Summary (Signed)
?Physician Discharge Summary ?  ?Patient: [name] MRN: [id number] DOB: [birth year]  ?Admit date:     05/08/2021  ?Discharge date: 05/13/21  ?Discharge Physician: Sharen Hones  ? ?PCP: [name], MD  ? ?Recommendations at discharge:  ? ?Follow-up with PCP in 1 week. ?Please refer to Dr. Lucky Cowboy for renal artery stenosis, the computer has a glitch, I could not put in instruction for referral. ?Follow-up with Dr. [name] in 2 weeks. ?Follow-up with Dr. Allen Norris for esophageal thickening.  Patient knows that she will call to schedule. ? ?Discharge Diagnoses: ?Active Problems: ?  Essential hypertension ?  Hyperlipidemia ?  History of CVA with residual deficit ?  NSTEMI (non-ST elevated myocardial infarction) ([place]) ?  CAD (coronary artery disease) ?  Hypertensive heart disease ?  Hypertension associated with stage 3 chronic kidney disease due to type 2 diabetes mellitus ([place]) ?  Type II diabetes mellitus with renal manifestations ([place]) ?  Chronic kidney disease, stage 3b ([place]) ?  Seizure ([place]) ?  Chest pain in patient with underlying coronary artery disease ?  Esophagitis ?  Renal artery stenosis, native [facility]) ? ?Resolved Problems: ?  * No resolved hospital problems. * ? ?Hospital Course: ?[name] is a 51 year old female with history of coronary disease status post PCI, chronic congestive heart failure with normalized ejection fraction, chronic kidney disease stage IIIa, history of GI bleed, history of CVA, aortic arch aneurysm, type 2 diabetes, obesity status post gastric bypass surgery, bipolar disorder, essential hypertension, dyslipidemia, migraine headache who present to the hospital with chest pain. ?She had multiple falls for the last 77-month she also had a eye infection which she had a surgery 2 days ago.  Still taking antibiotics. ?Upon arriving the hospital, she has a peak troponin of 36. ? ?05/09/2021.  Nuclear stress test showed reverse ischemia.  Scheduled for heart cath on Monday. ?4/8.   Patient had severe chest pain in the evening.  Placed on nitro drip, continued on heparin.  CT angiogram ruled out aortic dissection. ? ? ?Assessment and Plan: ?Renal artery stenosis, native ([facility] ?Incidental finding of a left renal artery stenosis.  Patient will need to follow-up with vascular surgery after discharge from hospital. ? ?Esophagitis ?Patient had a significant chest pain, CT scan showed a thickened esophageal mucosa.  Chest pain could be partially caused by esophagitis.  Continue Protonix twice a day, also added sucralfate. ?Due to patient cardiac condition at this time, will not pursue any work-up in the hospital.  Patient be referred to GI after discharge. ? ?Chest pain in patient with underlying coronary artery disease ?Appear to be secondary to coronary artery disease. ? ?[place]iz[place] ([place] ? Continue home medicines. ? ?Chronic kidney disease, stage 3b ([place] ?Patient had a slightly worsening renal function before the heart cath.  Renal function better after received gentle rehydration ? ?Type II diabetes mellitus with renal manifestations ([place] ?Glucose well controlled, resume home regimen. ? ?Hypertension associated with stage 3 chronic kidney disease due to type 2 diabetes mellitus ([place] ?Continue amlodipine and Coreg ? ?NSTEMI (non-ST elevated myocardial infarction) ([place] ?Cardiac cath showed severe two-vessel coronary disease with 95% distal RCA stenosis as well as multisegmental distal LAD disease as up to 70%, 90% ostial D2 stenosis. ?Patient has a drug-eluting stent placed in RCA. ?Chest pain since has resolved. ?Resume home dose of dual antiplatelet treatment and follow-up with cardiology in 2 weeks ? ?History of CVA with residual deficit ? Continue statin, DAPT ? ? ? ?  Hyperlipidemia ?--> Continue home statin, she is on high potency statin with rosuvastatin 40 mg daily and reports compliance with this ? ?Essential hypertension ? continue  amlodipine, carvedilol dose increased. ? ? ? ? ? ?   ? ? ?Consultants: Cardiology. ?Procedures performed: Coronary angiogram. ?Disposition: Home health ?Diet recommendation:  ?Discharge Diet Orders (From admission, onward)  ? ?  Start     Ordered  ? 05/13/21 0000  Diet - low sodium heart healthy       ? 05/13/21 1004  ? ?  ?  ? ?  ? ?Cardiac diet ?DISCHARGE MEDICATION: ?Allergies as of 05/13/2021   ? ?   Reactions  ? Desyrel [trazodone] Other (See Comments)  ? Pt states this medication brings on seizures   ? Lipitor [atorvastatin] Other (See Comments)  ? Leg pains  ? ?  ? ?  ?Medication List  ?  ? ?STOP taking these medications   ? ?furosemide 20 MG tablet ?Commonly known as: LASIX ?  ? ?  ? ?TAKE these medications   ? ?Aimovig 140 MG/ML [place] ?Generic drug: Erenumab-aooe ?Inject 140 mg into the skin every 28 (twenty-eight) days. ?  ?ALPRAZolam 1 MG tablet ?Commonly known as: Duanne Moron ?Take 1 tablet by mouth twice daily ?  ?amLODipine 10 MG tablet ?Commonly known as: NORVASC ?Take 1 tablet (10 mg total) by mouth daily. ?  ?aspirin 81 MG EC tablet ?Take 1 tablet (81 mg total) by mouth daily. Swallow whole. ?  ?Baclofen 5 MG Tabs ?Take 5 mg by mouth 2 (two) times daily as needed (muscle spasms). ?  ?carvedilol 25 MG tablet ?Commonly known as: COREG ?Take 1 tablet (25 mg total) by mouth 2 (two) times daily. ?What changed:  ?medication strength ?how much to take ?  ?clopidogrel 75 MG tablet ?Commonly known as: PLAVIX ?Take 1 tablet (75 mg total) by mouth daily. ?  ?desvenlafaxine 50 MG 24 hr tablet ?Commonly known as: PRISTIQ ?Take 50 mg by mouth daily. ?  ?diclofenac Sodium 1 % Gel ?Commonly known as: VOLTAREN ?Apply 2 g topically 4 (four) times daily as needed (joint paim). ?  ?diphenoxylate-atropine 2.5-0.025 MG tablet ?Commonly known as: Lomotil ?Take 1 tablet by mouth 4 (four) times daily as needed for diarrhea or loose stools. ?  ?estradiol 0.1 MG/GM vaginal cream ?Commonly known as: ESTRACE ?Place 1 Applicatorful vaginally 3 (three) times a week. Estrogen Cream  Instruction Discard applicator Apply pea sized amount to tip of finger to urethra before bed. Wash hands well after application. Use Monday, Wednesday and Friday ?  ?gabapentin 300 MG capsule ?Commonly known as: NEURONTIN ?Take 2 capsules (600 mg total) by mouth 2 (two) times daily. ?  ?isosorbide mononitrate 120 MG 24 hr tablet ?Commonly known as: IMDUR ?Take 1 tablet by mouth every day ?What changed: how much to take ?  ?lacosamide 200 MG Tabs tablet ?Commonly known as: VIMPAT ?Take 1 tablet (200 mg total) by mouth 2 (two) times daily. ?  ?levETIRAcetam 1000 MG tablet ?Commonly known as: KEPPRA ?Take 1 tablet (1,000 mg total) by mouth 2 (two) times daily. ?  ?levothyroxine 25 MCG tablet ?Commonly known as: SYNTHROID ?Take 1 tablet by mouth 30 minutes before breakfast ?What changed: See the new instructions. ?  ?mirabegron ER 50 MG Tb24 tablet ?Commonly known as: MYRBETRIQ ?Take 1 tablet (50 mg total) by mouth daily. ?  ?mirtazapine 7.5 MG tablet ?Commonly known as: REMERON ?Take 1 tablet (7.5 mg total) by mouth at bedtime. ?  ?nitroGLYCERIN 0.4  MG SL tablet ?Commonly known as: NITROSTAT ?Dissolve 1 tablet under tongue every 73mns, up to 3 doses, for chest pain. Max 3 doses in 372ms. ?What changed: See the new instructions. ?  ?Nurtec 75 MG Tbdp ?Generic drug: Rimegepant Sulfate ?Take 75 mg by mouth as needed. ?What changed: reasons to take this ?  ?ofloxacin 0.3 % ophthalmic solution ?Commonly known as: OCUFLOX ?1 drop 4 (four) times daily. ?  ?pantoprazole 40 MG tablet ?Commonly known as: PROTONIX ?Take 1 tablet by mouth twice daily ?  ?prednisoLONE acetate 1 % ophthalmic suspension ?Commonly known as: PRED FORTE ?1 drop 4 (four) times daily. ?  ?promethazine 25 MG tablet ?Commonly known as: PHENERGAN ?Take 1 tablet by mouth every 6 hours as needed for nausea or vomiting. ?  ?rosuvastatin 40 MG tablet ?Commonly known as: CRESTOR ?Take 1 tablet (40 mg total) by mouth daily at 6 PM. ?  ?SYSTANE OP ?Place 1 drop  into both eyes daily as needed (dry eyes). ?  ? ?  ? ? Follow-up Information   ? ? FiBirdie SonsMD Follow up in 1 week(s).   ?Specialty: Family Medicine ?Why: UNABLE TO GET THUR TO OFFICE PT HAVE PT CALL FOR A

## 2021-05-13 NOTE — Progress Notes (Signed)
? ? ?Progress Note ? ?Patient Name: [facility] ?Date of Encounter: 05/13/2021 ? ?Primary Cardiologist: [name] ? ?Subjective  ? ?Had a couple episodes of brief, mild chest pain overnight, not similar to angina that brought her to the hospital. Vitals and labs stable. She does have follow up with her ophthalmologist later today.  ? ?Inpatient Medications  ?  ?Scheduled Meds: ? ALPRAZolam  1 mg Oral BID  ? amLODipine  10 mg Oral Daily  ? aspirin EC  81 mg Oral Daily  ? carvedilol  25 mg Oral BID  ? Chlorhexidine Gluconate Cloth  6 each Topical Daily  ? clopidogrel  75 mg Oral Daily  ? gabapentin  600 mg Oral BID  ? heparin  5,000 Units Subcutaneous Q8H  ? insulin aspart  0-6 Units Subcutaneous TID WC  ? isosorbide mononitrate  120 mg Oral Daily  ? lacosamide  200 mg Oral BID  ? levETIRAcetam  1,000 mg Oral BID  ? levothyroxine  25 mcg Oral QAC breakfast  ? mirabegron ER  50 mg Oral Daily  ? mirtazapine  7.5 mg Oral QHS  ? ofloxacin  1 drop Right Eye QID  ? pantoprazole  40 mg Oral BID  ? prednisoLONE acetate  1 drop Right Eye QID  ? rosuvastatin  40 mg Oral q1800  ? sodium chloride flush  3 mL Intravenous Q12H  ? sucralfate  1 g Oral TID WC & HS  ? venlafaxine XR  75 mg Oral Q breakfast  ? ?Continuous Infusions: ? sodium chloride    ? ?PRN Meds: ?sodium chloride, acetaminophen, baclofen, diphenhydrAMINE, HYDROmorphone (DILAUDID) injection, labetalol, nitroGLYCERIN, ondansetron (ZOFRAN) IV, polyvinyl alcohol, Rimegepant Sulfate, sodium chloride flush  ? ?Vital Signs  ?  ?Vitals:  ? 05/13/21 0500 05/13/21 0600 05/13/21 0800 05/13/21 0843  ?BP: (!) 145/76 (!) 129/53 (!) 190/76 (!) 163/79  ?Pulse: (!) 58 (!) 57 66   ?Resp: '11 13 13   '$ ?Temp:   98.1 ?F (36.7 ?C)   ?TempSrc:      ?SpO2: 98% 97% 99%   ?Weight:      ?Height:      ? ? ?Intake/Output Summary (Last 24 hours) at 05/13/2021 0916 ?Last data filed at 05/12/2021 1800 ?Gross per 24 hour  ?Intake 456.62 ml  ?Output 0 ml  ?Net 456.62 ml  ? ?Filed Weights  ?  05/08/21 1256 05/10/21 2100  ?Weight: 75 kg 69.8 kg  ? ? ?Telemetry  ?  ?Sinus rhythm with sinus bradycardia, 50s to 80s bpm - Personally Reviewed ? ?ECG  ?  ?Pending - Personally Reviewed ? ?Physical Exam  ? ?GEN: No acute distress. Right eye with bandage in place.  ?Neck: No JVD. ?Cardiac: RRR, no murmurs, rubs, or gallops. Right radial arteriotomy site is without bleeding, swelling, bruising, warmth, or erythema. Mild TTP. Radial pulse 2+ proximal and distal to the arteriotomy site.  ?Respiratory: Clear to auscultation bilaterally.  ?GI: Soft, nontender, non-distended.   ?MS: No edema; No deformity. ?Neuro:  Alert and oriented x 3; Nonfocal.  ?Psych: Normal affect. ? ?Labs  ?  ?Chemistry ?Recent Labs  ?Lab 05/11/21 ?0569 05/12/21 ?7948 05/13/21 ?0327  ?NA 140 141 144  ?K 4.2 4.4 4.3  ?CL 106 106 112*  ?CO2 '26 26 25  '$ ?GLUCOSE 107* 123* 111*  ?BUN 29* 36* 35*  ?CREATININE 1.42* 1.84* 1.60*  ?CALCIUM 8.8* 8.5* 8.4*  ?GFRNONAA 45* 33* 39*  ?ANIONGAP '8 9 7  '$ ?  ? ?Hematology ?Recent Labs  ?Lab  05/11/21 ?3474 05/12/21 ?2595 05/13/21 ?0327  ?WBC 8.8 9.7 9.1  ?RBC 3.91 3.87 3.65*  ?HGB 10.2* 10.2* 9.7*  ?HCT 32.6* 32.9* 31.2*  ?MCV 83.4 85.0 85.5  ?MCH 26.1 26.4 26.6  ?MCHC 31.3 31.0 31.1  ?RDW 13.7 14.0 13.8  ?PLT 168 159 144*  ? ? ?Cardiac EnzymesNo results for input(s): TROPONINI in the last 168 hours. No results for input(s): TROPIPOC in the last 168 hours.  ? ?BNPNo results for input(s): BNP, PROBNP in the last 168 hours.  ? ?DDimer No results for input(s): DDIMER in the last 168 hours.  ? ?Radiology  ?  ?  ? ?Cardiac Studies  ? ?Pharmacologic MPI (05/09/2021): ?  Findings are consistent with ischemia. The study is intermediate risk. ?  No ST deviation was noted. ?  LV perfusion is abnormal. There is evidence of ischemia. There is no evidence of infarction. Defect 1: There is a medium defect with moderate reduction in uptake present in the mid to basal inferior and inferolateral location(s) that is partially  reversible. There is normal wall motion in the defect area. Consistent with ischemia. ?  Left ventricular function is abnormal. End diastolic cavity size is normal. End systolic cavity size is normal. ?  There is evidence of inferior wall ischemia but the study is suboptimal due to GI uptake. ?__________ ? ?LHC 05/12/2021: ?Conclusions: ?Severe two-vessel coronary artery disease with 95% distal RCA stenosis as well as multisegmental distal LAD disease of up to 70% and 90% ostial D2 stenosis. ?Moderate, noncritical mid RCA and proximal LCx disease. ?Patent D1 and distal LAD stents.  The distal LAD stent demonstrates up to 30% eccentric in-stent restenosis. ?Mildly elevated left ventricular filling pressure (LVEDP 15-20 mmHg). ?Successful PCI to distal RCA using Onyx Frontier 3.0 x 15 mm drug-eluting stent with 0% residual stenosis and TIMI-3 flow. ?Coronary vasospasm versus relatively small caliber of right radial artery precluding advancement of 262F guide catheter.  Intervention successfully performed using 62F JR4 guide catheter.  Alternative access may need to be considered for future interventions. ?  ?Recommendations: ?Continue indefinite dual antiplatelet therapy with aspirin and clopidogrel. ?Gentle post catheterization hydration to maintain net even fluid balance given acute kidney injury superimposed on chronic kidney disease and mildly elevated LVEDP during catheterization today. ?Aggressive secondary prevention of coronary artery disease. ? ?Patient Profile  ?   ?51 y.o. 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 Sheppard Coil. ? ?Assessment & Plan  ?  ?CAD involving the native coronary arteries with NSTEMI and abnormal stress test: ?Patient with recent escalation of her chronic chest pain with myocardial perfusion stress test this admission demonstrating inferior/inferolateral ischemia.  Catheterization 05/12/2021 showed severe distal RCA stenosis.  There was also mild in-stent restenosis in the distal LAD; otherwise coronary anatomy was unchanged from prior catheterization in 12/2019. -Patient underwent PCI to distal RCA. ?-Continue indefinite dual antiplatelet therapy with aspirin and clopidogrel. ?-Aggressive secondary prevention with rosuvastatin 40 mg daily. ?-Continue medical  therapy of residual RCA, LAD/diagonal disease.   ?-Continue isosorbide mononitrate 120 mg daily.   ?-Continue current doses of carvedilol and amlodipine. ?  ?Chronic HFrEF with recovered ejection fraction: ?LVEF normal by most recent echo in 12/2020, mildly reduced on recent MPI.  LVEDP mildly elevated today. ?-Continue carvedilol. ?-Defer adding ACE inhibitor/ARB in the setting of acute kidney injury superimposed on chronic kidney disease. ?  ?Acute kidney injury superimposed on chronic kidney disease: ?Baseline creatinine typically 1.5-1.8, with creatinine rising from 1.4-1.8 during her admission.  This may be typical variation for this patient though she recently received contrast for CTA of the chest, abdomen and pelvis. She received pre-cath hydration. Minimal contrast was used for catheterization/PCI on 4/10. ?-Improving today following hydration.  ?-Avoid nephrotoxic agents. ?  ?Hypertension: ?Blood pressure labile but overall improved. ?-Continue carvedilol, amlodipine and Coreg.  ?  ?Frequent falls: ?-Consider PT/OT evaluation prior to  discharge. ?  ?Anemia: ?Hemoglobin stable with history of severe GI bleed in the remote past. ?-Continue pantoprazole. ?-Continue aspirin and clopidogrel, as outlined above. ? ? ?[place] for discharge from our perspective. We w

## 2021-05-14 ENCOUNTER — Telehealth: Payer: Self-pay

## 2021-05-14 NOTE — Telephone Encounter (Addendum)
Transition Care Management Unsuccessful Follow-up Telephone Call ? ?Date of discharge and from where:  [place] 05-13-21 Dx: Essential HTN  ? ?Attempts:  1st Attempt ? ?Reason for unsuccessful TCM follow-up call:  Left voice message ? ?Transition Care Management Unsuccessful Follow-up Telephone Call ? ?Date of discharge and from where:  [place] 05-13-21 Dx: Essential HTN  ? ?Attempts:  2nd Attempt ? ?Reason for unsuccessful TCM follow-up call:  Left voice message ? ?Transition Care Management Follow-up Telephone Call ?Date of discharge and from where: [place] 05-13-21 Dx: Essential HTN  ?How have you been since you were released from the hospital? Doing ok  ?Any questions or concerns? No ? ?Items Reviewed: ?Did the pt receive and understand the discharge instructions provided? Yes  ?Medications obtained and verified? Yes  ?Other? No  ?Any new allergies since your discharge? No  ?Dietary orders reviewed? Yes ?Do you have support at home? Yes  ? ?Home Care and Equipment/Supplies: ?Were home health services ordered? Yes PT/OT ?If so, what is the name of the agency? [facility]   ?Has the agency set up a time to come to the patient's home? yes ?Were any new equipment or medical supplies ordered?  No ?What is the name of the medical supply agency? na ?Were you able to get the supplies/equipment? not applicable ?Do you have any questions related to the use of the equipment or supplies? No ? ?Functional Questionnaire: (I = Independent and D = Dependent) ?ADLs: I ? ?Bathing/Dressing- I ? ?Meal Prep- I ? ?Eating- I ? ?Maintaining continence- I ? ?Transferring/Ambulation- I- wheelchair ? ?Managing Meds- I ? ?Follow up appointments reviewed: ? ?PCP Hospital f/u appt confirmed? Yes  Scheduled to see Mardene Speak PA on 05-26-21 @ 140[m. ?[facility] f/u appt confirmed? Yes  Scheduled to see Dr Caryl Comes on 05-15-21 @ 11am. ?Are transportation arrangements needed? No  ?If their condition worsens, is the pt  aware to call PCP or go to the Emergency Dept.? Yes ?Was the patient provided with contact information for the PCP's office or ED? Yes ?Was to pt encouraged to call back with questions or concerns? Yes  ? ?  ?  ?

## 2021-05-14 NOTE — Telephone Encounter (Signed)
Copied from [place] [id number]. Topic: General - Other ?>> May 14, 2021  2:35 PM Oneta Rack wrote: ?Verbal orders for PT 1x for 7 weeks and social and OT eval orders, patient was discharged from the [facility] hospital on 05/13/2021 due to a heart attack, a heart stent was placed in patient, caller wanted to make PCP aware ?

## 2021-05-15 ENCOUNTER — Encounter: Payer: Self-pay | Admitting: Internal Medicine

## 2021-05-15 ENCOUNTER — Ambulatory Visit (INDEPENDENT_AMBULATORY_CARE_PROVIDER_SITE_OTHER): Payer: Medicare Other | Admitting: Internal Medicine

## 2021-05-15 ENCOUNTER — Telehealth: Payer: Self-pay | Admitting: Family Medicine

## 2021-05-15 VITALS — BP 120/79 | HR 54 | Ht 63.0 in | Wt 155.0 lb

## 2021-05-15 DIAGNOSIS — I255 Ischemic cardiomyopathy: Secondary | ICD-10-CM

## 2021-05-15 DIAGNOSIS — I10 Essential (primary) hypertension: Secondary | ICD-10-CM | POA: Diagnosis not present

## 2021-05-15 DIAGNOSIS — I5022 Chronic systolic (congestive) heart failure: Secondary | ICD-10-CM | POA: Diagnosis not present

## 2021-05-15 DIAGNOSIS — I951 Orthostatic hypotension: Secondary | ICD-10-CM | POA: Diagnosis not present

## 2021-05-15 DIAGNOSIS — Z8673 Personal history of transient ischemic attack (TIA), and cerebral infarction without residual deficits: Secondary | ICD-10-CM

## 2021-05-15 IMAGING — US US BREAST*L* LIMITED INC AXILLA
1 series · 3 of 3 positions shown · non-contrast
Comparison: Previous exam(s).

CLINICAL DATA: 48-year-old female presenting with a palpable area
of concern in the upper outer right breast. Additionally the
patient's physician questions a mass in the upper outer left breast.

EXAM:
DIGITAL DIAGNOSTIC BILATERAL MAMMOGRAM WITH CAD AND TOMO
ULTRASOUND BILATERAL BREAST

[Series 1: us breast*left* limited inc axilla · 0.06mm/px · 3 of 3 slices shown]
[im 1/3]
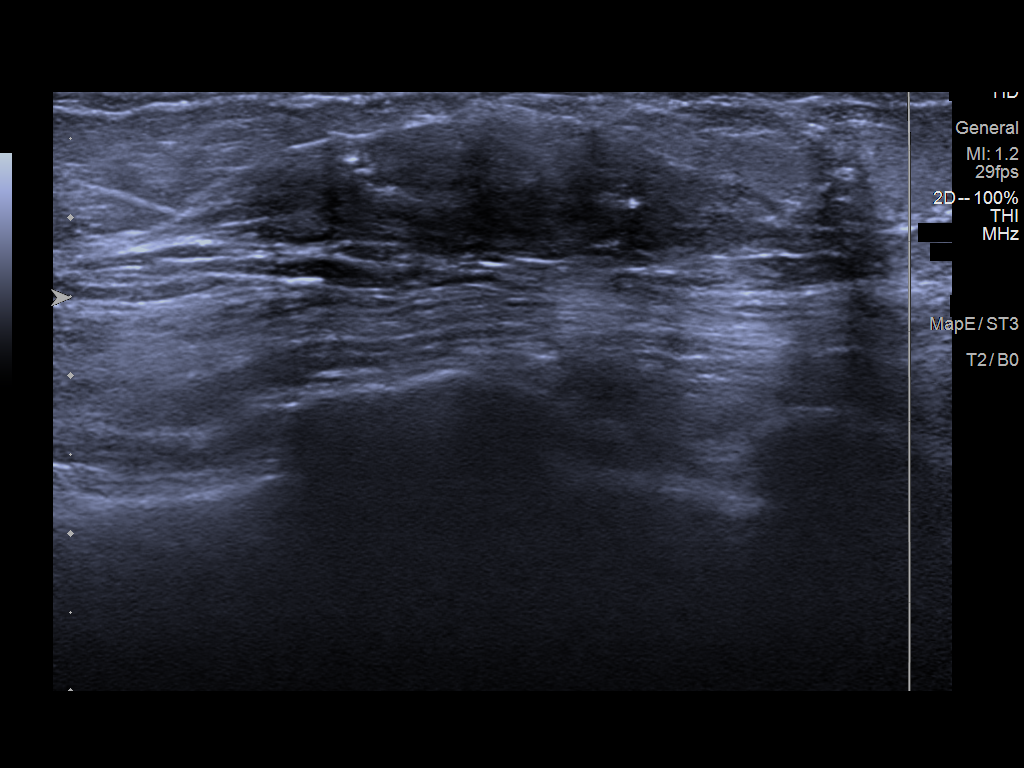
[im 2/3]
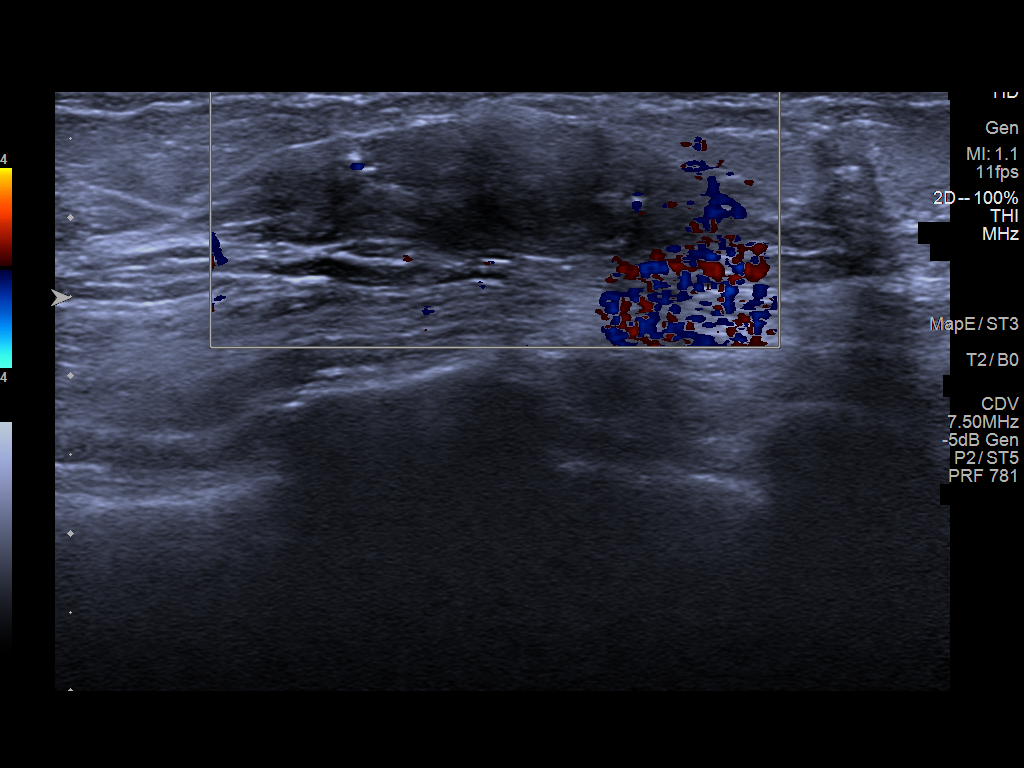
[im 3/3]
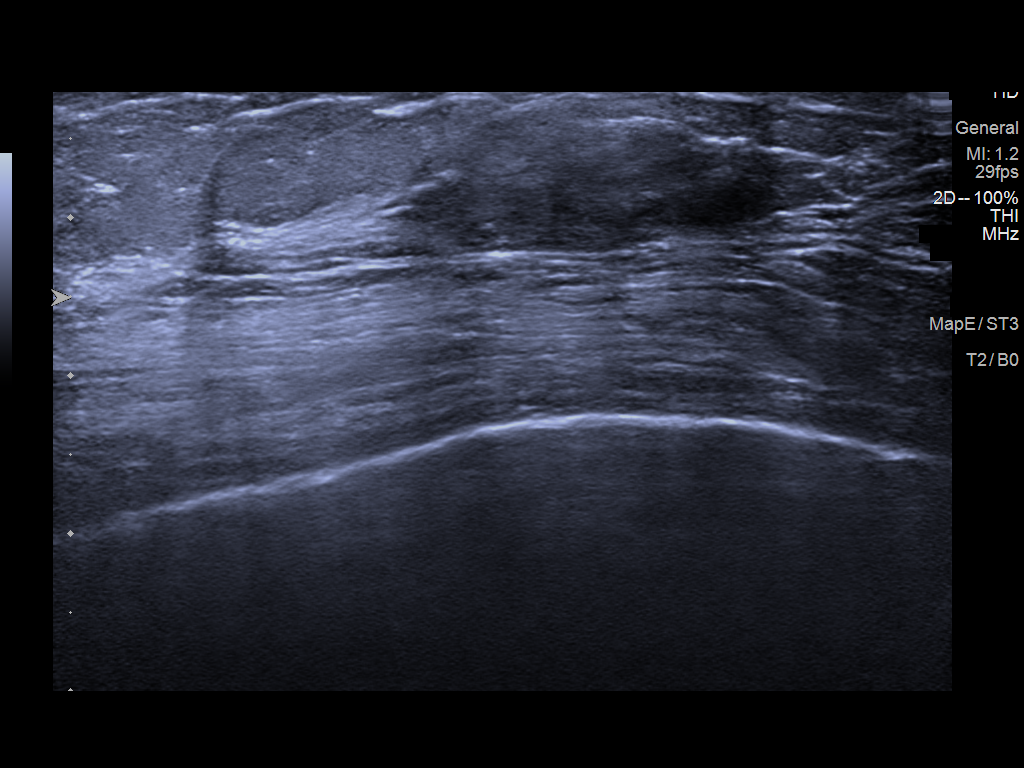

[3 of 3 positions shown; findings below may reference images not displayed]

ACR Breast Density Category b: There are scattered areas of
fibroglandular density.
FINDINGS: Mammogram:

Right breast: Full field and spot compression tomosynthesis views of
the right breast were performed. The palpable site of concern is
marked by a skin BB. There is a dense island of tissue but no
abnormality at the palpable site of concern. No suspicious mass,
distortion, or microcalcifications are identified to suggest
presence of malignancy.

Left breast: Full field and spot compression tomosynthesis views of
the right breast were performed. The palpable site of concern is
marked by a skin BB. Similar to the right breast there is a dense
island of tissue but no abnormality at the palpable site of concern.
No suspicious mass, distortion, or microcalcifications are
identified to suggest presence of malignancy.

Mammographic images were processed with CAD.

On physical exam, I feel a band of tissue at the site of concern
reported by the patient in the outer right breast which is similar
to the area of concern in the left breast. No discrete fixed mass.

Ultrasound:

Right breast:

Targeted ultrasound is performed in the palpable area of concern in
the right breast at 10 o'clock 2 cm from nipple demonstrating no
discrete cystic or solid mass. There is dense tissue in this
location.

Left breast:

Targeted ultrasound is performed at the palpable site of concern in
the left breast at 2 o'clock 2 cm from the nipple demonstrating no
discrete cystic or solid mass. There is an island of dense tissue.
IMPRESSION: No mammographic or sonographic evidence of malignancy at the
palpable site of concern in the bilateral breasts.

RECOMMENDATION:
Screening mammogram in one year.(Code:8E-7-ES1)

I have discussed the findings and recommendations with the patient.
If applicable, a reminder letter will be sent to the patient
regarding the next appointment.

BI-RADS CATEGORY  1: Negative.

## 2021-05-15 IMAGING — US US BREAST*R* LIMITED INC AXILLA
1 series · 4 of 4 positions shown · non-contrast
Comparison: Previous exam(s).

CLINICAL DATA: 48-year-old female presenting with a palpable area
of concern in the upper outer right breast. Additionally the
patient's physician questions a mass in the upper outer left breast.

EXAM:
DIGITAL DIAGNOSTIC BILATERAL MAMMOGRAM WITH CAD AND TOMO
ULTRASOUND BILATERAL BREAST

[Series 1: us breast*right* limited inc axilla · 0.05mm/px · 4 of 4 slices shown]
[im 1/4]
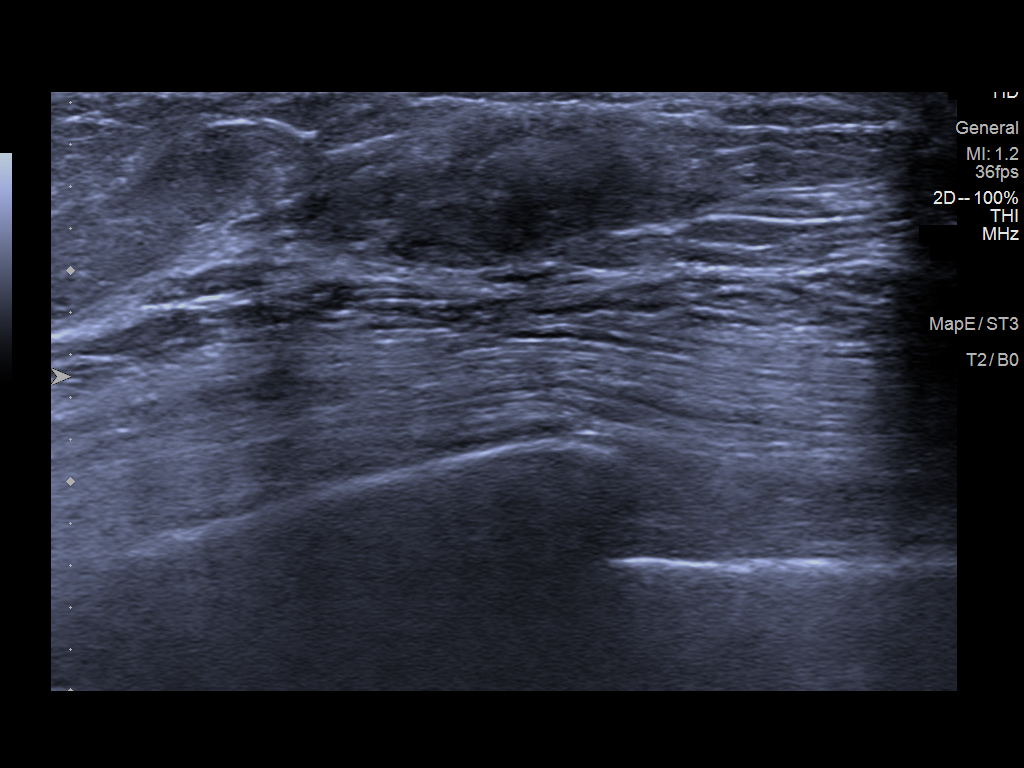
[im 2/4]
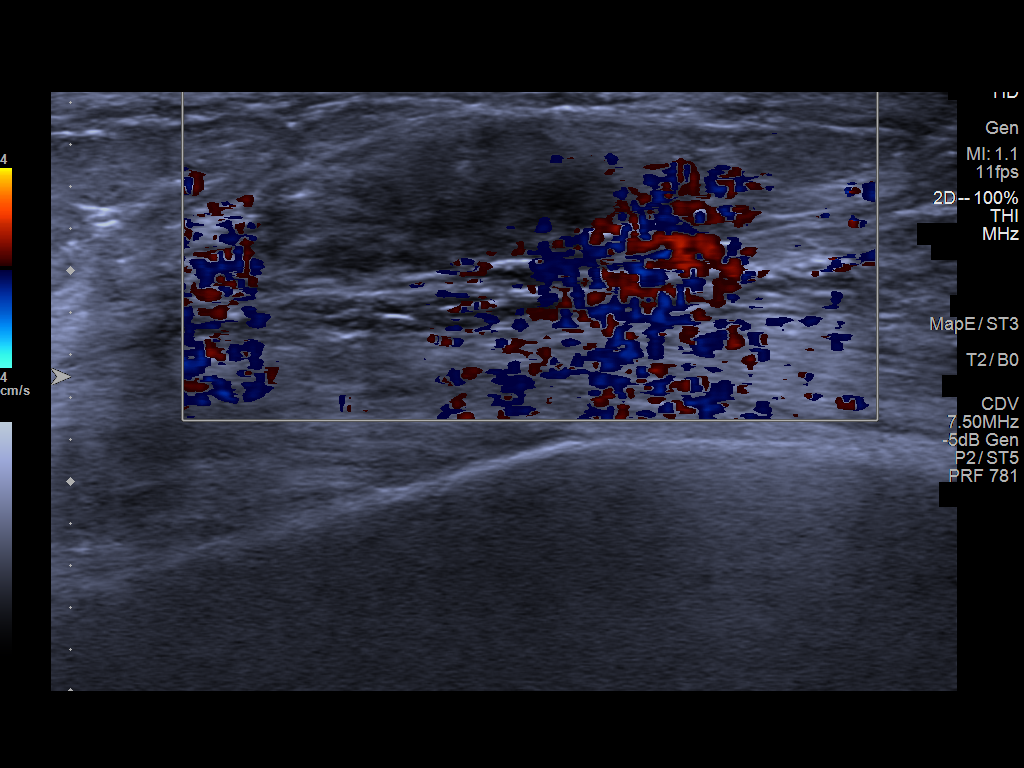
[im 3/4]
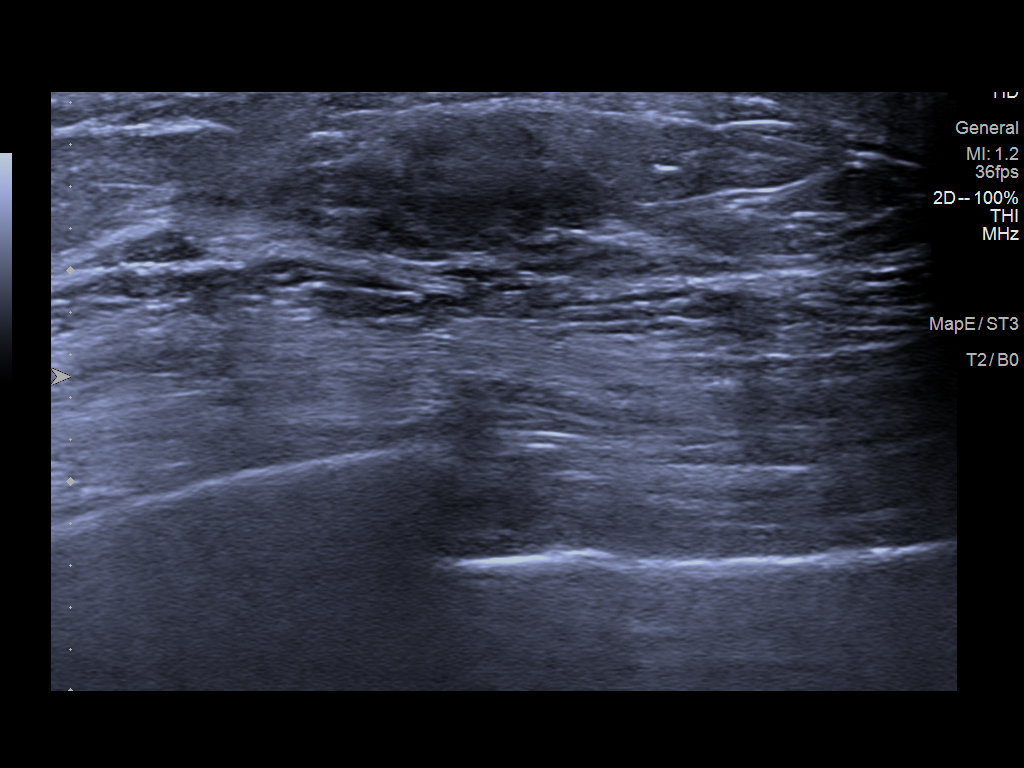
[im 4/4]
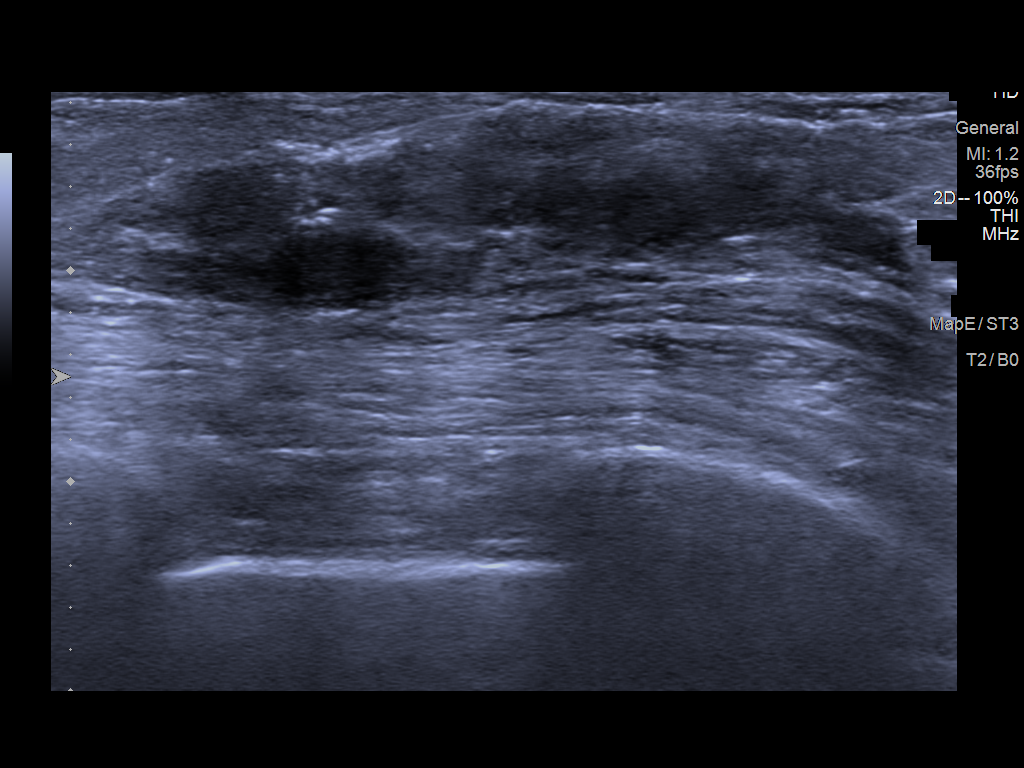

[4 of 4 positions shown; findings below may reference images not displayed]

ACR Breast Density Category b: There are scattered areas of
fibroglandular density.
FINDINGS: Mammogram:

Right breast: Full field and spot compression tomosynthesis views of
the right breast were performed. The palpable site of concern is
marked by a skin BB. There is a dense island of tissue but no
abnormality at the palpable site of concern. No suspicious mass,
distortion, or microcalcifications are identified to suggest
presence of malignancy.

Left breast: Full field and spot compression tomosynthesis views of
the right breast were performed. The palpable site of concern is
marked by a skin BB. Similar to the right breast there is a dense
island of tissue but no abnormality at the palpable site of concern.
No suspicious mass, distortion, or microcalcifications are
identified to suggest presence of malignancy.

Mammographic images were processed with CAD.

On physical exam, I feel a band of tissue at the site of concern
reported by the patient in the outer right breast which is similar
to the area of concern in the left breast. No discrete fixed mass.

Ultrasound:

Right breast:

Targeted ultrasound is performed in the palpable area of concern in
the right breast at 10 o'clock 2 cm from nipple demonstrating no
discrete cystic or solid mass. There is dense tissue in this
location.

Left breast:

Targeted ultrasound is performed at the palpable site of concern in
the left breast at 2 o'clock 2 cm from the nipple demonstrating no
discrete cystic or solid mass. There is an island of dense tissue.
IMPRESSION: No mammographic or sonographic evidence of malignancy at the
palpable site of concern in the bilateral breasts.

RECOMMENDATION:
Screening mammogram in one year.(Code:8E-7-ES1)

I have discussed the findings and recommendations with the patient.
If applicable, a reminder letter will be sent to the patient
regarding the next appointment.

BI-RADS CATEGORY  1: Negative.

## 2021-05-15 IMAGING — MG DIGITAL DIAGNOSTIC BILAT W/ TOMO W/ CAD
8 of 17 series · 8 of 40 positions shown · non-contrast
Comparison: Previous exam(s).

CLINICAL DATA: 48-year-old female presenting with a palpable area
of concern in the upper outer right breast. Additionally the
patient's physician questions a mass in the upper outer left breast.

EXAM:
DIGITAL DIAGNOSTIC BILATERAL MAMMOGRAM WITH CAD AND TOMO
ULTRASOUND BILATERAL BREAST

[R CC synth-2D (1 of 2)]
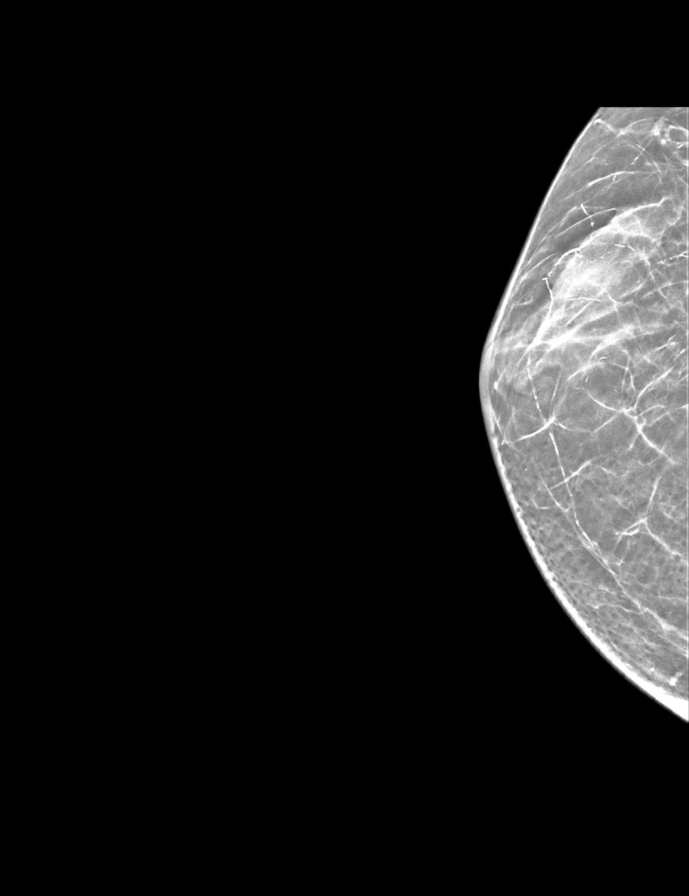

[R ML synth-2D]
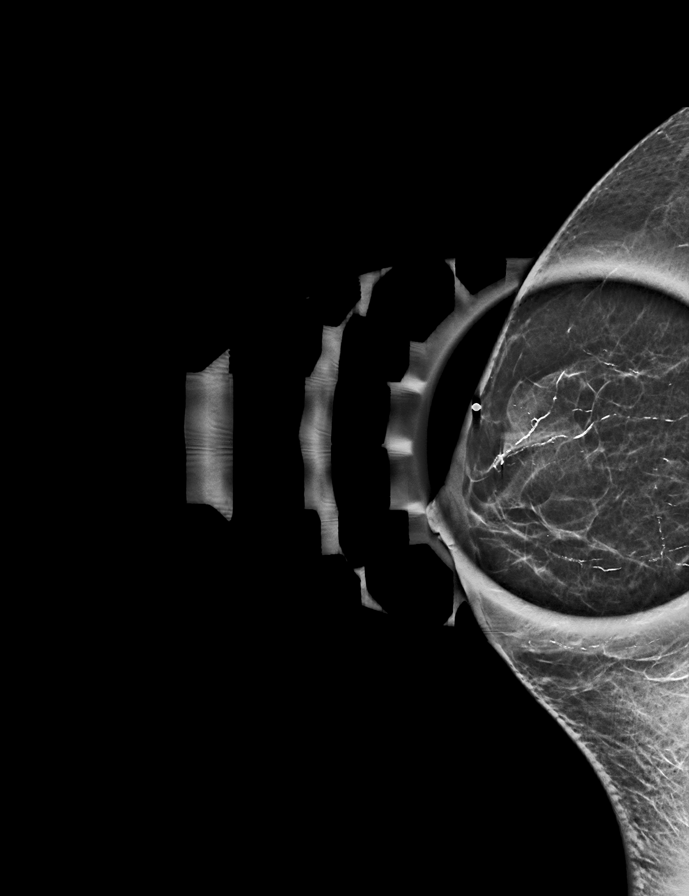

[L CC synth-2D]
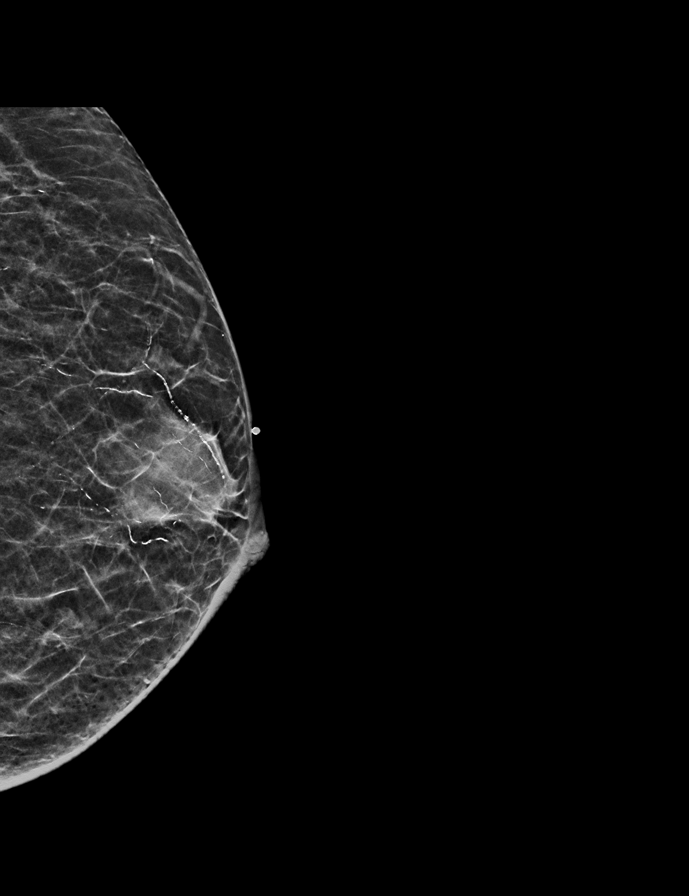

[L MLO synth-2D (1 of 2)]
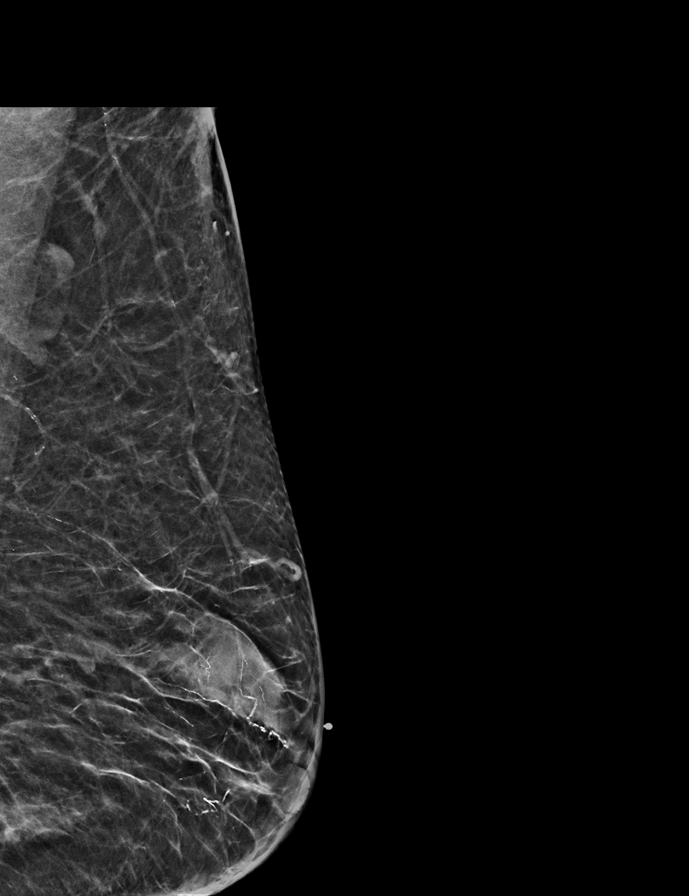

[L MLO synth-2D (2 of 2)]
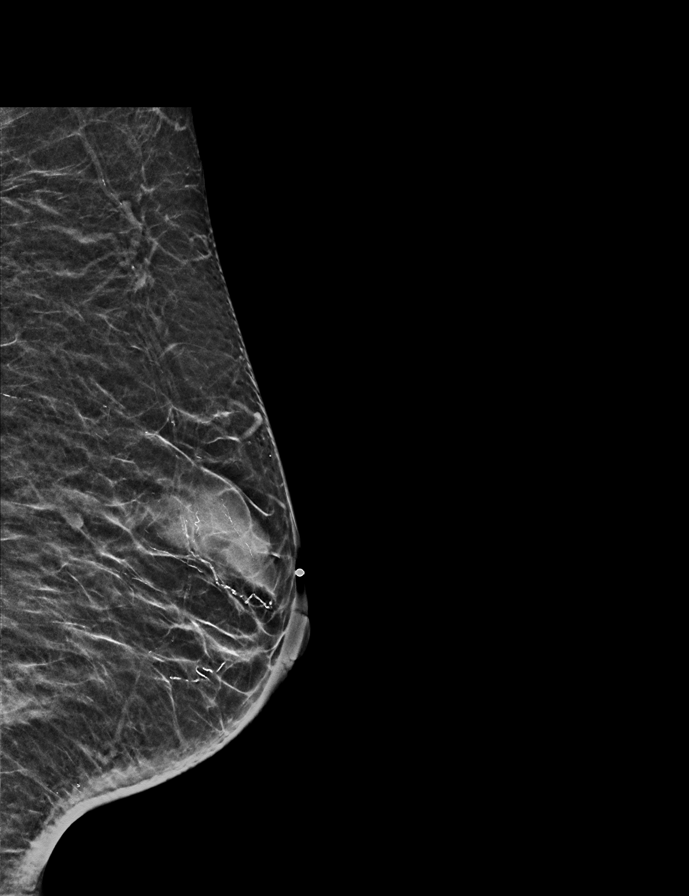

[R MLO synth-2D]
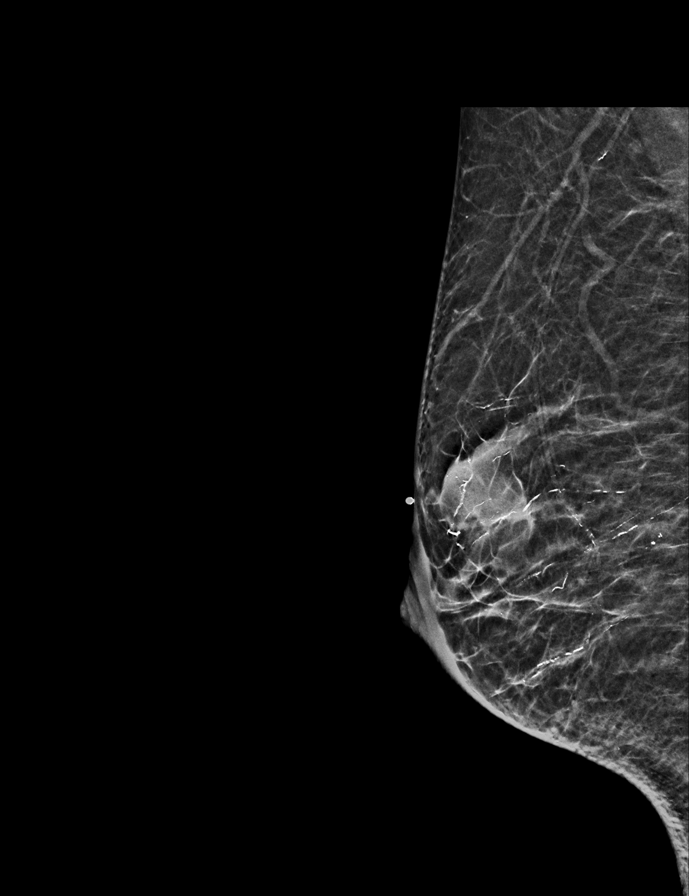

[R CC synth-2D (2 of 2)]
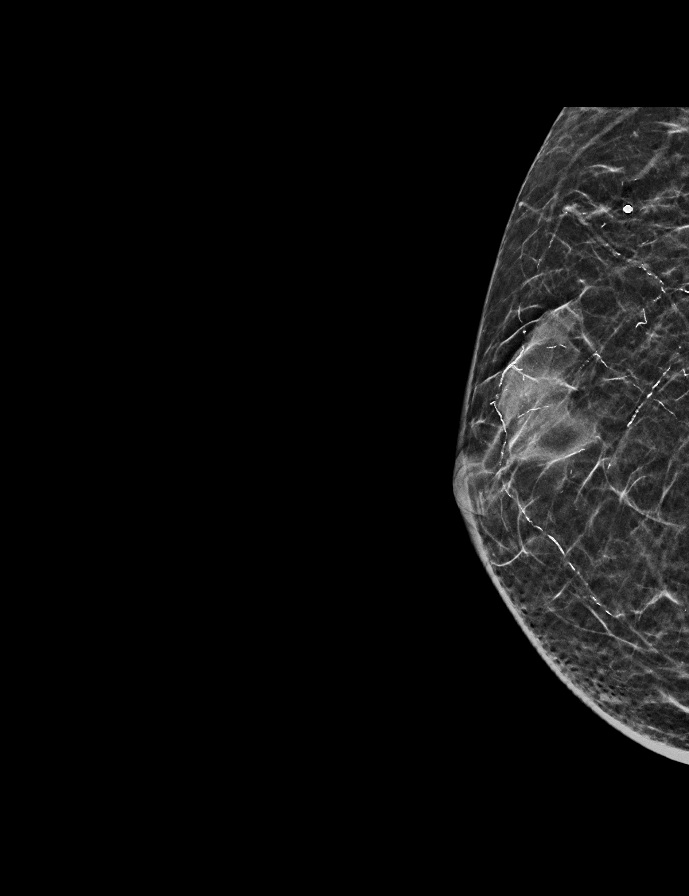

[R ML tomo · tomo slice 17/32.0]
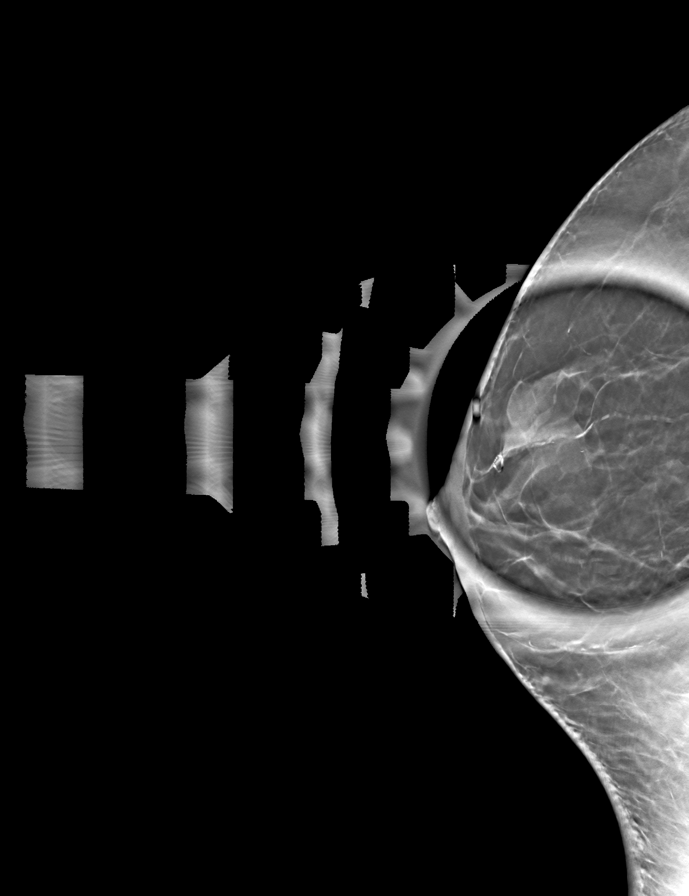

[8 of 40 positions shown; findings below may reference images not displayed]

ACR Breast Density Category b: There are scattered areas of
fibroglandular density.
FINDINGS: Mammogram:

Right breast: Full field and spot compression tomosynthesis views of
the right breast were performed. The palpable site of concern is
marked by a skin BB. There is a dense island of tissue but no
abnormality at the palpable site of concern. No suspicious mass,
distortion, or microcalcifications are identified to suggest
presence of malignancy.

Left breast: Full field and spot compression tomosynthesis views of
the right breast were performed. The palpable site of concern is
marked by a skin BB. Similar to the right breast there is a dense
island of tissue but no abnormality at the palpable site of concern.
No suspicious mass, distortion, or microcalcifications are
identified to suggest presence of malignancy.

Mammographic images were processed with CAD.

On physical exam, I feel a band of tissue at the site of concern
reported by the patient in the outer right breast which is similar
to the area of concern in the left breast. No discrete fixed mass.

Ultrasound:

Right breast:

Targeted ultrasound is performed in the palpable area of concern in
the right breast at 10 o'clock 2 cm from nipple demonstrating no
discrete cystic or solid mass. There is dense tissue in this
location.

Left breast:

Targeted ultrasound is performed at the palpable site of concern in
the left breast at 2 o'clock 2 cm from the nipple demonstrating no
discrete cystic or solid mass. There is an island of dense tissue.
IMPRESSION: No mammographic or sonographic evidence of malignancy at the
palpable site of concern in the bilateral breasts.

RECOMMENDATION:
Screening mammogram in one year.(Code:8E-7-ES1)

I have discussed the findings and recommendations with the patient.
If applicable, a reminder letter will be sent to the patient
regarding the next appointment.

BI-RADS CATEGORY  1: Negative.

## 2021-05-15 MED ORDER — ISOSORBIDE MONONITRATE ER 30 MG PO TB24: 15.0000 mg | ORAL_TABLET | Freq: Every day | ORAL | 3 refills | Status: DC

## 2021-05-15 MED ORDER — RANOLAZINE ER 500 MG PO TB12: 500.0000 mg | ORAL_TABLET | Freq: Two times a day (BID) | ORAL | 6 refills | Status: DC

## 2021-05-15 MED ORDER — METOPROLOL TARTRATE 25 MG PO TABS: 25.0000 mg | ORAL_TABLET | Freq: Two times a day (BID) | ORAL | 6 refills | Status: DC

## 2021-05-15 NOTE — Telephone Encounter (Signed)
Per Dr. Caryn Section, ok to use same day slot. Patient needs to be seen on 05/20/2020 for hospital f/u. Tried calling patient. Left message to call back. Please schedule appointment when patient calls back.  ?

## 2021-05-15 NOTE — Progress Notes (Signed)
? ? ? ? ?Patient Care Team: ?[name], MD as PCP - General (Family Medicine) ?Minna Merritts, MD as PCP - Cardiology (Cardiology) ?Vladimir Crofts, MD as Consulting Physician (Neurology) ?Anthonette Legato, MD as Consulting Physician (Nephrology) ?Lloyd Huger, MD as Consulting Physician (Oncology) ?Poggi, Marshall Cork, MD as Consulting Physician (Orthopedic Surgery) ?Warnell Forester, NP (Inactive) as Nurse Practitioner (Endocrinology) ?Caroline More, DPM as Consulting Physician (Podiatry) ?Copland, Ginette Otto as Referring Physician (Obstetrics and Gynecology) ?Danice Goltz, MD as Consulting Physician (Ophthalmology) ?Carloyn Manner, MD as Referring Physician (Otolaryngology) ?Germaine Pomfret, [facility] (Pharmacist) ?Marcial Pacas, MD as Consulting Physician (Neurology) ? ? ?HPI ? ?[name] is a 51 y.o. female ?Seen in followup for orthostatic hypotension with syncope in context of CAD s/p PCI-multiple prior gastric bypass surgery and palpitations. ? ?  ?Admitted 3.19 with syncope    Found to be orthostatic-profound ( BP sys 123>>>78// HR 73>> 99 ) ,  MRI>> "patchy acute v subacute infarcts" perhaps watershed 2/2 ? Orthostatic hypotension   ? ?Admitted 4/23 with new RCA disease for which she under went stenting ? ? ?Problems with orthostatic intolerance, heat intolerance shower intolerance.  She is not sure whether this precedes or postdates her gastric bypass surgery undertaken 11 years ago. ? ?At the last visit we discussed the physiology and the value of pharmacological and nonpharmacological treatments  ?  ?She is largely wheelchair-bound since February.  She started having what has been diagnosed as absence seizure's and the one in February has been complicated by persistent issues with walking and eating. ? ?Dry eyes dry mouth constipation and urinary issues ? ?Recurrent syncope and presyncope. ? ? 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 ? ?I walked in the room and she was in a wheelchair diaphoretic unresponsive of and with a barely palpable pulse.  See below ? ?R   ? ?Previous medications used: ? ?Midodrine-y ?Florinef-n ?Droxydopa-n ?Mestinon-n ?Beta blockers-y ?Ivabradine-n ?Serotonin drugs-n ?Anticholinergics-n ? ? ? ? ? ? ?Also worries about "V. tach "told at [facility] well as [facility] that she had it.  Review of telemetry through 11/21 failed to reveal arrhythmia ?  ?DATE TEST EF    ?10/16 Echo   45-50 %    ?10/16 LHC   % LAD DES  ?5/17 LHC   D1 DES  ?2/19 Echo  60-65%    ?5/19 LHC   D2 POBA, dLAD 70%>> med Rx  ?8/19 LHC  D2 cutting balloon  ?3/22 Echo 60-65%   ?4/23 LHC  LADd-DES:patent; D1 MHD:QQIWLN ?LADd-70%;RCAd-95>>STENT  ?    ?  ?  ?  ? ?Past Medical History:  ?Diagnosis Date  ? Acute colitis 01/27/2017  ? Acute pyelonephritis   ? Acute upper GI bleed 01/25/2020  ? Anemia   ? iron deficiency anemia  ? Aortic arch aneurysm ([place])   ? BRCA negative 2014  ? CAD (coronary artery disease)   ? a. 08/2003 Cath: LAD 30-40-med Rx; b. 11/2014 PCI: LAD 9m(3.25x23 Xience Alpine DES); c. 06/2015 PCI: D1 (2.25x12 Resolute Integrity DES); d. 06/2017 PCI: Patent mLAD stent, D2 95 (PTCA); e. 09/2017 PCI: D2 99ost (CBA); d. 12/2017 Cath: LM nl, LAD 341m80d (small), D1 40ost, D2 95ost, LCX 40p, RCA 40ost/p->Med rx for D2 given restenosis.  ? Closed nondisplaced [place]trochanteric [place] of [place]  ?  Colitis 06/03/2015  ? Colon polyp   ? CVA (cerebral vascular accident) [facility])   ? Left side weakness.   ? Degenerative tear of glenoid labrum of right shoulder 03/15/2017  ? Diabetes mellitus without complication ([place])   ? Family history of breast cancer   ? BRCA neg 2014  ? Femur fracture, left ([place]) 10/12/2018  ? Gastric ulcer 04/27/2011  ? Helicobacter pylori infection 11/23/2014  ? History of echocardiogram   ? a. 03/2017 Echo: EF 60-65%, no rwma; b. 02/2018 Echo:  EF 60-65%, no rwma. Nl RV fxn. No cardiac source of emboli (admitted w/ stroke).  ? Hypertension   ? Iron deficiency anemia secondary to blood loss (chronic)   ? Malignant melanoma of skin of scalp ([place])   ? MI, acute, non ST segment elevation ([place])   ? Neuromuscular disorder ([place])   ? S/P drug eluting coronary stent placement 06/04/2015  ? Sepsis ([place]) 03/18/2017  ? Sepsis secondary to UTI ([place]) 04/23/2020  ? Stroke [facility])   ? a. 02/2018 MRI: 37m late acute/early subacute L medial frontal lobe inarct; b. 02/2018 MRA No large vessel occlusion or aneurysm. Mod to sev L P2 stenosis. thready L vertebral artery, diffusely dzs'd; c. 02/2018 Carotid U/S: <50% bilat ICA dzs.  ? Unspecified atrial fibrillation ([place] 04/23/2020  ? ? ?Past Surgical History:  ?Procedure Laterality Date  ? APPENDECTOMY    ? BALLOON ENTEROSCOPY  02/06/2020  ? [place] ? BIOPSY N/A 03/14/2020  ? Procedure: BIOPSY;  Surgeon: WLucilla Lame MD;  [place]  Service: Endoscopy;  Laterality: N/A;  ? CARDIAC CATHETERIZATION N/A 11/09/2014  ? Procedure: Coronary Angiography;  Surgeon: TMinna Merritts MD;  [place];  Service: Cardiovascular;  Laterality: N/A;  ? CARDIAC CATHETERIZATION N/A 11/12/2014  ? Procedure: Coronary Stent Intervention;  Surgeon: AIsaias Cowman MD;  [place];  Service: Cardiovascular;  Laterality: N/A;  ? CARDIAC CATHETERIZATION N/A 04/18/2015  ? Procedure: Left Heart Cath and Coronary Angiography;  Surgeon: TMinna Merritts MD;  [facility];  Service: Cardiovascular;  Laterality: N/A;  ? CARDIAC CATHETERIZATION Left 06/04/2015  ? Procedure: Left Heart Cath and Coronary Angiography;  Surgeon: MWellington Hampshire MD;  [place] [facility];  Service: Cardiovascular;  Laterality: Left;  ? CARDIAC CATHETERIZATION N/A 06/04/2015  ? Procedure: Coronary Stent Intervention;  Surgeon: MWellington Hampshire MD;  [place];  Service:  Cardiovascular;  Laterality: N/A;  ? CESAREAN SECTION  2001  ? CHOLECYSTECTOMY N/A 11/18/2016  ? Procedure: LAPAROSCOPIC CHOLECYSTECTOMY WITH INTRAOPERATIVE CHOLANGIOGRAM;  Surgeon: SChristene Lye MD;  [facility];  Service: General;  Laterality: N/A;  ? COLONOSCOPY WITH PROPOFOL N/A 04/27/2016  ? Procedure: COLONOSCOPY WITH PROPOFOL;  Surgeon: DLucilla Lame MD;  [place]  Service: Endoscopy;  Laterality: N/A;  ? COLONOSCOPY WITH PROPOFOL N/A 01/12/2018  ? Procedure: COLONOSCOPY WITH PROPOFOL;  Surgeon: Tol[place], TBenay Pike MD;  Location: [facility];  Service: Endoscopy;  Laterality: N/A;  ? COLONOSCOPY WITH PROPOFOL N/A 03/14/2020  ? Procedure: COLONOSCOPY WITH PROPOFOL;  Surgeon: WLucilla Lame MD;  [place]  Service: Endoscopy;  Laterality: N/A;  Needs to be scheduled after 7:30 due to driver issues  ? CORONARY ANGIOPLASTY    ? CORONARY BALLOON ANGIOPLASTY N/A 06/29/2017  ? Procedure: CORONARY BALLOON ANGIOPLASTY;  Surgeon: AWellington Hampshire MD;  [place];  Service: Cardiovascular;  Laterality: N/A;  ? CORONARY  BALLOON ANGIOPLASTY N/A 09/20/2017  ? Procedure: CORONARY BALLOON ANGIOPLASTY;  Surgeon: Wellington Hampshire, MD;  Location: [place] [facility];  Service: Cardiovascular;  Laterality: N/A;  ? CORONARY STENT INTERVENTION N/A 12/13/2019  ? Procedure: CORONARY STENT INTERVENTION;  Surgeon: Wellington Hampshire, MD;  Location: [place] [facility];  Service: Cardiovascular;  Laterality: N/A;  ? CORONARY STENT INTERVENTION N/A 05/12/2021  ? Procedure: CORONARY STENT INTERVENTION;  Surgeon: Nelva Bush, MD;  Location: [place] [facility];  Service: Cardiovascular;  Laterality: N/A;  ? DILATION AND CURETTAGE OF UTERUS    ? ESOPHAGOGASTRODUODENOSCOPY (EGD) WITH PROPOFOL N/A 09/14/2014  ? Procedure: ESOPHAGOGASTRODUODENOSCOPY (EGD) WITH PROPOFOL;  Surgeon: Josefine Class, MD;  Location: [facility];  Service: Endoscopy;   Laterality: N/A;  ? ESOPHAGOGASTRODUODENOSCOPY (EGD) WITH PROPOFOL N/A 04/27/2016  ? Procedure: ESOPHAGOGASTRODUODENOSCOPY (EGD) WITH PROPOFOL;  Surgeon: [name], MD;  Location: [place];  Service: [place]

## 2021-05-15 NOTE — Telephone Encounter (Signed)
Pt is calling to schedule a hospital follow up. No available appts within the next 2 weeks. First available is Jun 09, 2021. ? ?Please advise with Patient - [phone] ?

## 2021-05-15 NOTE — Patient Instructions (Signed)
Medication Instructions:  ?- Your physician has recommended you make the following change in your medication:  ? ?1) DECREASE Imdur (isosorbide) to 30 mg: ?- take 0.5 tablet (15 mg) by mouth ONCE daily  ? ?2) STOP Coreg (carvedilol) ? ?3) START Lopressor (metoprolol tartrate) 25 mg: ?- take 1 tablet by mouth TWICE daily  ? ?4) START Ranexa (ranolazine) 500 mg: ?- take 1 tablet by mouth TWICE daily  ? ?*If you need a refill on your cardiac medications before your next appointment, please call your pharmacy* ? ? ?Lab Work: ?- none ordered ? ?If you have labs (blood work) drawn today and your tests are completely normal, you will receive your results only by: ?MyChart Message (if you have MyChart) OR ?A paper copy in the mail ?If you have any lab test that is abnormal or we need to change your treatment, we will call you to review the results. ? ? ?Testing/Procedures: ?- none ordered ? ? ?Follow-Up: ?At [facility], you and your health needs are our priority.  As part of our continuing mission to provide you with exceptional heart care, we have created designated Provider Care Teams.  These Care Teams include your primary Cardiologist (physician) and Advanced Practice Providers (APPs -  Physician Assistants and Nurse Practitioners) who all work together to provide you with the care you need, when you need it. ?  ? ?Your next appointment:   ?Tuesday 05/20/21 at 9:00 am  ? ?The format for your next appointment:   ?In Person ? ?Provider:   ?Virl Axe, MD  ? ? ?Other Instructions ? ?1) You are strongly encouraged to: ? ?- wear thigh & abdominal compression ?- raise the head of the bed 6 inches ?- sit with your legs down ? ? ?Metoprolol Tablets ?What is this medication? ?METOPROLOL (me TOE proe lole) treats high blood pressure. It also prevents chest pain (angina) or further damage after a heart attack. It works by lowering your blood pressure and heart rate, making it easier for your heart to pump blood to the rest of  your body. It belongs to a group of medications called beta blockers. ?This medicine may be used for other purposes; ask your health care provider or pharmacist if you have questions. ?COMMON BRAND NAME(S): Lopressor ?What should I tell my care team before I take this medication? ?They need to know if you have any of these conditions: ?Diabetes ?Heart or vessel disease like slow heart rate, worsening heart failure, heart block, sick sinus syndrome, or Raynaud's disease ?Kidney disease ?Liver disease ?Lung or breathing disease, like asthma or emphysema ?Pheochromocytoma ?Thyroid disease ?An unusual or allergic reaction to metoprolol, other beta blockers, medications, foods, dyes, or preservatives ?Pregnant or trying to get pregnant ?Breast-feeding ?How should I use this medication? ?Take this medication by mouth with water. Take it as directed on the prescription label at the same time every day. You can take it with or without food. You should always take it the same way. Keep taking it unless your care team tells you to stop. ?Talk to your care team about the use of this medication in children. Special care may be needed. ?Overdosage: If you think you have taken too much of this medicine contact a poison control center or emergency room at once. ?NOTE: This medicine is only for you. Do not share this medicine with others. ?What if I miss a dose? ?If you miss a dose, take it as soon as you can. If it is  almost time for your next dose, take only that dose. Do not take double or extra doses. ?What may interact with this medication? ?This medication may interact with the following: ?Certain medications for blood pressure, heart disease, irregular heartbeat ?Certain medications for depression like monoamine oxidase (MAO) inhibitors, fluoxetine, or paroxetine ?Clonidine ?Dobutamine ?Epinephrine ?Isoproterenol ?Reserpine ?This list may not describe all possible interactions. Give your health care provider a list of all  the medicines, herbs, non-prescription drugs, or dietary supplements you use. Also tell them if you smoke, drink alcohol, or use illegal drugs. Some items may interact with your medicine. ?What should I watch for while using this medication? ?Visit your care team for regular checks on your progress. Check your blood pressure as directed. Ask your care team what your blood pressure should be. Also, find out when you should contact them. ?Do not treat yourself for coughs, colds, or pain while you are using this medication without asking your care team for advice. Some medications may increase your blood pressure. ?You may get drowsy or dizzy. Do not drive, use machinery, or do anything that needs mental alertness until you know how this medication affects you. Do not stand up or sit up quickly, especially if you are an older patient. This reduces the risk of dizzy or fainting spells. Alcohol may interfere with the effect of this medication. Avoid alcoholic drinks. ?This medication may increase blood sugar. Ask your care team if changes in diet or medications are needed if you have diabetes. ?What side effects may I notice from receiving this medication? ?Side effects that you should report to your care team as soon as possible: ?Allergic reactions--skin rash, itching, hives, swelling of the face, lips, tongue, or throat ?Heart failure--shortness of breath, swelling of the ankles, feet, or hands, sudden weight gain, unusual weakness or fatigue ?Low blood pressure--dizziness, feeling faint or lightheaded, blurry vision ?Raynaud's--cool, numb, or painful fingers or toes that may change color from pale, to blue, to red ?Slow heartbeat--dizziness, feeling faint or lightheaded, confusion, trouble breathing, unusual weakness or fatigue ?Worsening mood, feelings of depression ?Side effects that usually do not require medical attention (report to your care team if they continue or are bothersome): ?Change in sex drive or  performance ?Diarrhea ?Dizziness ?Fatigue ?Headache ?This list may not describe all possible side effects. Call your doctor for medical advice about side effects. You may report side effects to FDA at 1-800-FDA-1088. ?Where should I keep my medication? ?Keep out of the reach of children and pets. ?Store at room temperature between 15 and 30 degrees C (59 and 86 degrees F). Protect from moisture. Keep the container tightly closed. Throw away any unused medication after the expiration date. ?NOTE: This sheet is a summary. It may not cover all possible information. If you have questions about this medicine, talk to your doctor, pharmacist, or health care provider. ?? 2022 Elsevier/Gold Standard (2020-10-08 00:00:00) ? ? ?Ranolazine Extended-Release Tablets ?What is this medication? ?RANOLAZINE (ra NOE la zeen) is a heart medication. It is used to treat chronic chest pain (angina). It will not relieve an acute episode of chest pain. ?This medicine may be used for other purposes; ask your health care provider or pharmacist if you have questions. ?COMMON BRAND NAME(S): Ranexa ?What should I tell my care team before I take this medication? ?They need to know if you have any of these conditions: ?Heart disease ?Irregular heartbeat or rhythm ?Kidney disease ?Liver disease ?Low levels of potassium or magnesium in the blood ?  An unusual or allergic reaction to ranolazine, other medications, foods, dyes, or preservatives ?Pregnant or trying to get pregnant ?Breast-feeding ?How should I use this medication? ?Take this medication by mouth with water. Take it as directed on the prescription label. Do not cut, crush or chew this medication. Swallow the tablets whole. You may take it with or without food. Do not take it with grapefruit juice. Keep taking it unless your care team tells you to stop. ?Talk to your care team about the use of this medication in children. Special care may be needed. ?Overdosage: If you think you have taken  too much of this medicine contact a poison control center or emergency room at once. ?NOTE: This medicine is only for you. Do not share this medicine with others. ?What if I miss a dose? ?If you miss a dos

## 2021-05-15 NOTE — Telephone Encounter (Addendum)
Verbal order given. Patient was discharged from the hospital on 05/13/2021. Per discharge summary, patient needs to f/u with PCP in 1 week. There are no available openings in 1 week on Dr. Maralyn Sago schedule (other than same day slots). Please advise if patient can be worked in.  ?

## 2021-05-15 NOTE — Telephone Encounter (Signed)
Tried calling patient to schedule appointment. Please schedule appointment when patient returns call.  ?

## 2021-05-15 NOTE — Telephone Encounter (Signed)
SameDay slots can be used for hospital follow ups. Thanks! ?

## 2021-05-16 ENCOUNTER — Telehealth: Payer: Self-pay | Admitting: Family Medicine

## 2021-05-16 ENCOUNTER — Other Ambulatory Visit: Payer: Self-pay | Admitting: Emergency Medicine

## 2021-05-16 DIAGNOSIS — D509 Iron deficiency anemia, unspecified: Secondary | ICD-10-CM

## 2021-05-16 NOTE — Telephone Encounter (Signed)
Home Health Verbal Orders - Caller/Agency: stephanie w/ [facility] ?Callback Number: [phone] ?Requesting: OT ?Frequency: 1 wk 6  ?

## 2021-05-16 NOTE — Telephone Encounter (Signed)
PLEASE ADVISE.

## 2021-05-17 NOTE — Telephone Encounter (Signed)
That's fine

## 2021-05-19 ENCOUNTER — Inpatient Hospital Stay: Payer: Medicare Other

## 2021-05-19 DIAGNOSIS — E1122 Type 2 diabetes mellitus with diabetic chronic kidney disease: Secondary | ICD-10-CM

## 2021-05-19 DIAGNOSIS — I2510 Atherosclerotic heart disease of native coronary artery without angina pectoris: Secondary | ICD-10-CM | POA: Diagnosis not present

## 2021-05-19 DIAGNOSIS — G40911 Epilepsy, unspecified, intractable, with status epilepticus: Secondary | ICD-10-CM

## 2021-05-19 DIAGNOSIS — Z48812 Encounter for surgical aftercare following surgery on the circulatory system: Secondary | ICD-10-CM | POA: Diagnosis not present

## 2021-05-19 DIAGNOSIS — I509 Heart failure, unspecified: Secondary | ICD-10-CM

## 2021-05-19 DIAGNOSIS — I69354 Hemiplegia and hemiparesis following cerebral infarction affecting left non-dominant side: Secondary | ICD-10-CM

## 2021-05-19 DIAGNOSIS — I214 Non-ST elevation (NSTEMI) myocardial infarction: Secondary | ICD-10-CM | POA: Diagnosis not present

## 2021-05-19 DIAGNOSIS — M47814 Spondylosis without myelopathy or radiculopathy, thoracic region: Secondary | ICD-10-CM

## 2021-05-19 DIAGNOSIS — M5135 Other intervertebral disc degeneration, thoracolumbar region: Secondary | ICD-10-CM

## 2021-05-19 DIAGNOSIS — I130 Hypertensive heart and chronic kidney disease with heart failure and stage 1 through stage 4 chronic kidney disease, or unspecified chronic kidney disease: Secondary | ICD-10-CM | POA: Diagnosis not present

## 2021-05-19 DIAGNOSIS — N1832 Chronic kidney disease, stage 3b: Secondary | ICD-10-CM

## 2021-05-19 NOTE — Progress Notes (Deleted)
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 intravenous contrast. Multiplanar CT image reconstructions were also generated. RADIATION DOSE REDUCTION: This exam was performed according to the departmental dose-optimization program which includes automated exposure control, adjustment of the mA and/or kV according to patient size and/or use of iterative reconstruction technique. COMPARISON:  None. FINDINGS: CT THORACIC SPINE FINDINGS Alignment: Normal. Vertebrae: No acute fracture or focal pathologic process. Paraspinal and other soft tissues: Right upper lobe ground-glass opacity concerning for atelectasis or infiltrate. Disc levels: Multilevel degenerate disc disease with Schmorl node in the inferior endplate of T5, T6, T8 and T11. CT LUMBAR SPINE FINDINGS Segmentation: 5 lumbar type vertebrae. Alignment: Normal.  Vertebrae: No acute fracture or focal pathologic process. Paraspinal and other soft tissues: Negative. Disc levels: Mild multilevel degenerative disc disease of the lumbar spine. Small anterior osteophytes at multiple levels. Mild facet joint arthropathy. IMPRESSION: Thoracic spine: 1.  No evidence of fracture or subluxation. 2. Multilevel degenerate disc disease, with Schmorl nodes at T5, T6, T8 and T11. No significant spinal canal or neural foraminal stenosis. 3. Small subcentimeter ground-glass opacities in the right upper lobe, which may represent atelectasis or infectious process. Clinical correlation is suggested. Lumbar spine 4.  No acute fracture or subluxation of the lumbar spine. 5. Degenerate disc disease of the lumbar spine prominent at L4-L5 and L5-S1. Electronically Signed   By: Keane Police D.O.   On: 05/05/2021 13:55   CARDIAC CATHETERIZATION  Result Date: 05/12/2021 Conclusions: Severe two-vessel coronary artery disease with 95% distal RCA stenosis as well as multisegmental distal LAD disease of up to 70% and 90% ostial D2 stenosis. Moderate, noncritical mid RCA and proximal LCx disease. Patent D1 and distal LAD stents.  The distal LAD stent demonstrates up to 30% eccentric in-stent restenosis. Mildly elevated left ventricular filling pressure (LVEDP 15-20 mmHg). Successful PCI to distal RCA using Onyx Frontier 3.0 x 15 mm drug-eluting stent with 0% residual stenosis and TIMI-3 flow. Coronary vasospasm versus relatively small caliber of right radial artery precluding advancement of 12F guide catheter.  Intervention successfully performed using 40F JR4 guide catheter.  Alternative access may need to be considered for future interventions. Recommendations: Continue indefinite dual antiplatelet therapy with aspirin and clopidogrel. Gentle post catheterization hydration to maintain net even fluid balance given acute kidney injury superimposed on chronic kidney disease and mildly elevated LVEDP during  catheterization today. Aggressive secondary prevention of coronary artery disease. Nelva Bush, MD [facility]  NM Myocar Multi W/Spect Tamela Oddi Motion / EF  Result Date: 05/09/2021   Findings are consistent with ischemia. The study is intermediate risk.   No ST deviation was noted.   LV perfusion is abnormal. There is evidence of ischemia. There is no evidence of infarction. Defect 1: There is a medium defect with moderate reduction in uptake present in the mid to basal inferior and inferolateral location(s) that is partially reversible. There is normal wall motion in the defect area. Consistent with ischemia.   Left ventricular function is abnormal. End diastolic cavity size is normal. End systolic cavity size is normal.   There is evidence of inferior wall ischemia but the study is suboptimal due to GI uptake.   CT Angio Chest/Abd/Pel for Dissection W and/or W/WO  Result Date: 05/10/2021 CLINICAL DATA:  Chest or back pain, aortic dissection suspected. The patient has severe chest pain with blood pressure 213/112. No ST-elevation on EKG. EXAM: CT ANGIOGRAPHY CHEST, ABDOMEN AND PELVIS TECHNIQUE: Non-contrast CT of the chest was initially obtained. Multidetector CT imaging through the chest, abdomen and pelvis was performed using the standard protocol during bolus administration of intravenous contrast. Multiplanar reconstructed images and MIPs were obtained and reviewed to evaluate the vascular anatomy. RADIATION DOSE REDUCTION: This exam was performed according to the departmental dose-optimization program which includes automated exposure control, adjustment of the mA and/or kV according to patient size and/or use of iterative reconstruction technique. CONTRAST:  86m OMNIPAQUE IOHEXOL 350 MG/ML SOLN COMPARISON:  CTA chest 11/18/2019, abdomen and pelvis CT with contrast 12/05/2020 FINDINGS: CTA CHEST FINDINGS Cardiovascular: There is mild cardiomegaly with a left chamber predominance and no findings of  acute right heart strain. There is no pericardial effusion.  The pulmonary veins are decompressed. There is patchy calcification in the LAD and circumflex coronary arteries trace calcification in the right coronary artery. Minimal aortic arch atherosclerosis with right-sided arch and descending segment. There is no aortic hematoma, aneurysm, dissection or penetrating ulcer or further significant plaques. Again noted is an aberrant left subclavian artery, embolized with a closure device with patent bypass graft from the posterolateral left common carotid artery to the left subclavian artery with good opacification of both subclavian arteries. The pulmonary arteries are normal in caliber without evidence of embolus to the segmental level. Mediastinum/Nodes: The esophagus is again noted compressed between the aberrant left subclavian artery and trachea, above this the proximal thoracic esophagus is increasingly thickened. Below this the esophagus is mildly thickened and patulous but no more than previously. There is no thyroid mass, intrathoracic or axillary adenopathy. There are old surgical clips in the left superior mediastinum. The trachea is clear. Lungs/Pleura: Minimal layering left and trace right pleural effusions are new from prior studies. There is no pneumothorax. Accessory azygous lobe and fissure again noted medial right apex. Minimal centrilobular emphysema both lung apices. There is chronic elevation of the right hemidiaphragm, unchanged. Minimal atelectasis noted both posterior bases. There is scattered ground-glass disease in the posterior aspect of the right upper lobe and in the lateral aspect of the left upper lobe mid field, and in the superior segment of the left lower lobe. 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 Similar ground-glass disease was noted previously in the right upper lobe. Musculoskeletal: There are degenerative disc changes, spondylosis and Schmorl's nodes in the mid to lower  thoracic spine but no destructive osseous process. The ribcage is intact. Review of the MIP images confirms the above findings. CTA ABDOMEN AND PELVIS FINDINGS VASCULAR Aorta: There is minimal scattered calcific plaque without aneurysm, dissection, vasculitis or stenosis, or penetrating ulcer. Celiac: Patent without evidence of aneurysm, dissection, vasculitis or significant stenosis. SMA: Patent without evidence of aneurysm, dissection, vasculitis or significant stenosis. Renals: There are bilateral single renal arteries. There is a 40% soft plaque stenosis in the proximal 5 mm of the right renal artery, and 80-90% soft plaque stenosis in the proximal 9 mm of the left renal artery. Both renal arteries are otherwise clear. IMA: Patent without evidence of aneurysm, dissection, vasculitis or significant stenosis. Inflow: Patent without evidence of aneurysm, dissection, vasculitis or significant stenosis. Scattered nonstenosing calcific plaques are again noted in the common iliac and internal iliac arteries. Veins: No obvious venous abnormality within the limitations of this arterial phase study. Review of the MIP images confirms the above findings. NON-VASCULAR Hepatobiliary: 20.5 cm length mildly steatotic liver without mass. Status post cholecystectomy without biliary dilatation. Pancreas: No focal abnormality or ductal dilatation. Spleen: No focal abnormality or splenomegaly. Adrenals/Urinary Tract: There is no adrenal mass. There is bilateral renal cortical thinning and scarring, more on the left, both kidneys measuring small length at 8.6 cm. There is no mass enhancement common no urinary stone or obstruction. The bladder is moderately distended with the dome reaching the level of L4-5. No focal wall thickening is seen. Stomach/Bowel: Old gastric bypass. The unopacified small and large bowel without pathologic dilatation or wall thickening. An appendix is not seen. Moderate fecal stasis. Uncomplicated left  colonic diverticulosis. Lymphatic: No appreciable adenopathy in the abdomen or pelvis. There are scattered subcutaneous opacities in the anterior abdominal wall not seen previously, nonspecific but may be sites of subcutaneous injection. Reproductive: The uterus is intact.  The ovaries are normal  in size. Other: There is no free air, hemorrhage or fluid Musculoskeletal: Evidence of prior left hip nailing. Degenerative change lumbar spine. Moderate acquired spinal stenosis L4-5. Review of the MIP images confirms the above findings. IMPRESSION: 1. No acute CTA abnormality. No evidence of thoracic or abdominal aortic aneurysm. 2. Small amounts of aortic atherosclerosis, right-sided aortic arch with surgically occluded aberrant left subclavian artery, with patent left common carotid to left subclavian bypass graft. 3. The only flow-limiting stenoses involve the most proximal renal arteries, with soft plaque stenosis of 40% on the right and 80-90% on the left. 4. Coronary artery calcifications heaviest in the LAD. 5. Chronic compression of the esophagus between the aberrant left subclavian artery and trachea, with increasing thickening in the more cephalad thoracic esophagus, chronic thickening and patulous appearance in the more distal thoracic esophagus. 6. Scattered ground-glass opacities of the lungs most likely due to multifocal pneumonitis including viral and atypical etiologies. Follow-up study recommended to ensure clearing. 7. Bilateral renal cortical thinning and scarring with small renal lengths. 8. Hepatic steatosis and enlargement.  Unchanged. 9. Constipation and diverticulosis.  Old gastric bypass. 10. Moderately distended bladder without focal abnormality. 11. Scattered subcutaneous opacities in the abdominal wall not seen previously, possibly sites of subcutaneous injection but clinical correlation advised. Electronically Signed   By: Telford Nab M.D.   On: 05/10/2021 20:47     ASSESSMENT: Iron  deficiency anemia.  PLAN:    1. Iron deficiency anemia: Patient continues to have a mildly decreased hemoglobin and iron stores.  She is also symptomatic.  She has had no further GI bleeds.  She likely has poor absorption given her history of gastric bypass surgery.  Previously, her entire anemia work-up was either negative or within normal limits.  Feraheme was switched to Venofer for secondary to insurance reasons.  Proceed with 200 mg IV Venofer today.  Return to clinic next week for 2 additional infusions.  Patient will then return to clinic in 3 months with repeat laboratory work, further evaluation, and consideration of additional treatment if needed.   2.  Ulcer: Patient does not report any active bleeding.  Continue follow-up with GI as indicated. 3.  Chronic renal insufficiency: Patient's most recent creatinine on March 13, 2018 was 2.21.  I spent a total of 30 minutes reviewing chart data, face-to-face evaluation with the patient, counseling and coordination of care as detailed above.    Patient expressed understanding and was in agreement with this plan. She also understands that She can call clinic at any time with any questions, concerns, or complaints.   Lloyd Huger, MD   05/19/2021 2:17 PM

## 2021-05-19 NOTE — Telephone Encounter (Signed)
Tried returning [employer] call. Left detailed verbal message stating "ok for requested verbal order".  ?

## 2021-05-20 ENCOUNTER — Inpatient Hospital Stay: Payer: Medicare Other | Admitting: Oncology

## 2021-05-20 ENCOUNTER — Inpatient Hospital Stay: Payer: Medicare Other

## 2021-05-20 ENCOUNTER — Encounter: Payer: Self-pay | Admitting: Internal Medicine

## 2021-05-20 ENCOUNTER — Ambulatory Visit: Payer: Medicare Other | Admitting: Family Medicine

## 2021-05-20 ENCOUNTER — Ambulatory Visit (INDEPENDENT_AMBULATORY_CARE_PROVIDER_SITE_OTHER): Payer: Medicare Other | Admitting: Internal Medicine

## 2021-05-20 VITALS — BP 133/79 | HR 69 | Ht 63.0 in

## 2021-05-20 DIAGNOSIS — I951 Orthostatic hypotension: Secondary | ICD-10-CM

## 2021-05-20 DIAGNOSIS — I10 Essential (primary) hypertension: Secondary | ICD-10-CM | POA: Diagnosis not present

## 2021-05-20 DIAGNOSIS — I255 Ischemic cardiomyopathy: Secondary | ICD-10-CM

## 2021-05-20 DIAGNOSIS — I5022 Chronic systolic (congestive) heart failure: Secondary | ICD-10-CM

## 2021-05-20 DIAGNOSIS — D509 Iron deficiency anemia, unspecified: Secondary | ICD-10-CM

## 2021-05-20 MED ORDER — AMLODIPINE BESYLATE 10 MG PO TABS: 5.0000 mg | ORAL_TABLET | Freq: Every day | ORAL | Status: DC

## 2021-05-20 NOTE — Progress Notes (Signed)
? ? ? ? ?Patient Care Team: ?[name], MD as PCP - General (Family Medicine) ?Minna Merritts, MD as PCP - Cardiology (Cardiology) ?Vladimir Crofts, MD as Consulting Physician (Neurology) ?Anthonette Legato, MD as Consulting Physician (Nephrology) ?Lloyd Huger, MD as Consulting Physician (Oncology) ?Poggi, Marshall Cork, MD as Consulting Physician (Orthopedic Surgery) ?Warnell Forester, NP (Inactive) as Nurse Practitioner (Endocrinology) ?Caroline More, DPM as Consulting Physician (Podiatry) ?Copland, Ginette Otto as Referring Physician (Obstetrics and Gynecology) ?Danice Goltz, MD as Consulting Physician (Ophthalmology) ?Carloyn Manner, MD as Referring Physician (Otolaryngology) ?Germaine Pomfret, [facility] (Pharmacist) ?Marcial Pacas, MD as Consulting Physician (Neurology) ? ? ?HPI ? ?[name] is a 51 y.o. female ?Seen in followup for orthostatic hypotension with syncope in context of CAD s/p PCI-multiple prior gastric bypass surgery and palpitations. ? ?  ?Admitted 3.19 with syncope    Found to be orthostatic-profound ( BP sys 123>>>78// HR 73>> 99 ) ,  MRI>> "patchy acute v subacute infarcts" perhaps watershed 2/2 ? Orthostatic hypotension   ? ?Admitted 4/23 with new RCA disease for which she under went stenting ? ? ?Problems with orthostatic intolerance, heat intolerance shower intolerance.  She is not sure whether this precedes or postdates her gastric bypass surgery undertaken 11 years ago. ? ?At the last visit we discussed the physiology and the value of pharmacological and nonpharmacological treatments  ?  ?She is largely wheelchair-bound since February.  She started having what has been diagnosed as absence seizure's and the one in February has been complicated by persistent issues with walking and eating. ? ?Dry eyes dry mouth constipation and urinary issues ? ?Recurrent syncope and presyncope. ? ? 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 ? ?I walked in the room and she was in a wheelchair diaphoretic unresponsive of and with a barely palpable pulse.  See below ? ?With this having occurred, discussions with Dr. Bethann Humble resulted in a bunch of medication changes including down titration of isosorbide, changing of her carvedilol to metoprolol to avoid the alpha blockade, and the addition of ranolazine for her ischemia ? ?Previous medications used: ? ?Midodrine-y ?Florinef-n ?Droxydopa-n ?Mestinon-n ?Beta blockers-y ?Ivabradine-n ?Serotonin drugs-n ?Anticholinergics-n ? ?She is vastly improved.  She is able to stand and walk.  Dizziness is less.  No chest pain. ?  ?DATE TEST EF    ?10/16 Echo   45-50 %    ?10/16 LHC   % LAD DES  ?5/17 LHC   D1 DES  ?2/19 Echo  60-65%    ?5/19 LHC   D2 POBA, dLAD 70%>> med Rx  ?8/19 LHC  D2 cutting balloon  ?3/22 Echo 60-65%   ?4/23 LHC  LADd-DES:patent; D1 ZOX:WRUEAV ?LADd-70%;RCAd-95>>STENT  ?    ?  ?  ?  ? ?Past Medical History:  ?Diagnosis Date  ? Acute colitis 01/27/2017  ? Acute pyelonephritis   ? Acute upper GI bleed 01/25/2020  ? Anemia   ? iron deficiency anemia  ? 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)   ? BRCA negative 2014  ? CAD (coronary artery disease)   ? a. 08/2003 Cath: LAD 30-40-med Rx; b. 11/2014 PCI: LAD 25m(3.25x23 Xience Alpine DES); c. 06/2015 PCI: D1 ((4.09W11Resolute Integrity DES); d. 06/2017 PCI: Patent mLAD stent, D2 95 (PTCA); e. 09/2017 PCI: D2 99ost (CBA); d. 12/2017 Cath: LM nl, LAD 367m80d (small), D1  40ost, D2 95ost, LCX 40p, RCA 40ost/p->Med rx for D2 given restenosis.  ? Closed nondisplaced intertrochanteric fracture of left femur ([place]) 10/12/2018  ? Colitis 06/03/2015  ? Colon polyp   ? CVA (cerebral vascular accident) [facility])   ? Left side weakness.   ? Degenerative tear of glenoid labrum of right shoulder 03/15/2017  ? Diabetes mellitus without complication ([place])   ? Family history of breast cancer   ? BRCA neg 2014  ?  Femur fracture, left ([place]) 10/12/2018  ? Gastric ulcer 04/27/2011  ? Helicobacter pylori infection 11/23/2014  ? History of echocardiogram   ? a. 03/2017 Echo: EF 60-65%, no rwma; b. 02/2018 Echo: EF 60-65%, no rwma. Nl RV fxn. No cardiac source of emboli (admitted w/ stroke).  ? Hypertension   ? Iron deficiency anemia secondary to blood loss (chronic)   ? Malignant melanoma of skin of scalp ([place])   ? MI, acute, non ST segment elevation ([place])   ? Neuromuscular disorder ([place])   ? S/P drug eluting coronary stent placement 06/04/2015  ? Sepsis ([place]) 03/18/2017  ? Sepsis secondary to UTI ([place]) 04/23/2020  ? Stroke [facility])   ? a. 02/2018 MRI: 65m late acute/early subacute L medial frontal lobe inarct; b. 02/2018 MRA No large vessel occlusion or aneurysm. Mod to sev L P2 stenosis. thready L vertebral artery, diffusely dzs'd; c. 02/2018 Carotid U/S: <50% bilat ICA dzs.  ? Unspecified atrial fibrillation ([place] 04/23/2020  ? ? ?Past Surgical History:  ?Procedure Laterality Date  ? APPENDECTOMY    ? BALLOON ENTEROSCOPY  02/06/2020  ? [place] ? BIOPSY N/A 03/14/2020  ? Procedure: BIOPSY;  Surgeon: WLucilla Lame MD;  [facility]  Service: Endoscopy;  Laterality: N/A;  ? CARDIAC CATHETERIZATION N/A 11/09/2014  ? Procedure: Coronary Angiography;  Surgeon: TMinna Merritts MD;  [facility];  Service: Cardiovascular;  Laterality: N/A;  ? CARDIAC CATHETERIZATION N/A 11/12/2014  ? Procedure: Coronary Stent Intervention;  Surgeon: AIsaias Cowman MD;  [place] [facility];  Service: Cardiovascular;  Laterality: N/A;  ? CARDIAC CATHETERIZATION N/A 04/18/2015  ? Procedure: Left Heart Cath and Coronary Angiography;  Surgeon: TMinna Merritts MD;  [facility];  Service: Cardiovascular;  Laterality: N/A;  ? CARDIAC CATHETERIZATION Left 06/04/2015  ? Procedure: Left Heart Cath and Coronary Angiography;  Surgeon: MWellington Hampshire MD;  [facility];  Service:  Cardiovascular;  Laterality: Left;  ? CARDIAC CATHETERIZATION N/A 06/04/2015  ? Procedure: Coronary Stent Intervention;  Surgeon: MWellington Hampshire MD;  [facility];  Service: Cardiovascular;  Laterality: N/A;  ? CESAREAN SECTION  2001  ? CHOLECYSTECTOMY N/A 11/18/2016  ? Procedure: LAPAROSCOPIC CHOLECYSTECTOMY WITH INTRAOPERATIVE CHOLANGIOGRAM;  Surgeon: SChristene Lye MD;  Location: [facility];  Service: General;  Laterality: N/A;  ? COLONOSCOPY WITH PROPOFOL N/A 04/27/2016  ? Procedure: COLONOSCOPY WITH PROPOFOL;  Surgeon: DLucilla Lame MD;  [place] [place]  Service: Endoscopy;  Laterality: N/A;  ? COLONOSCOPY WITH PROPOFOL N/A 01/12/2018  ? Procedure: COLONOSCOPY WITH PROPOFOL;  Surgeon: [place], TBenay Pike MD;  Location: [facility];  Service: Endoscopy;  Laterality: N/A;  ? COLONOSCOPY WITH PROPOFOL N/A 03/14/2020  ? Procedure: COLONOSCOPY WITH PROPOFOL;  Surgeon: WLucilla Lame MD;  [place]  Service: Endoscopy;  Laterality: N/A;  Needs to be scheduled after 7:30 due to driver issues  ? CORONARY ANGIOPLASTY    ? CORONARY BALLOON ANGIOPLASTY N/A 06/29/2017  ?  Procedure: CORONARY BALLOON ANGIOPLASTY;  Surgeon: Wellington Hampshire, MD;  Location: [place] [facility];  Service: Cardiovascular;  Laterality: N/A;  ? CORONARY BALLOON ANGIOPLASTY N/A 09/20/2017  ? Procedure: CORONARY BALLOON ANGIOPLASTY;  Surgeon: Wellington Hampshire, MD;  Location: [place] [facility];  Service: Cardiovascular;  Laterality: N/A;  ? CORONARY STENT INTERVENTION N/A 12/13/2019  ? Procedure: CORONARY STENT INTERVENTION;  Surgeon: Wellington Hampshire, MD;  Location: [place] CV LAB;  Service: Cardiovascular;  Laterality: N/A;  ? CORONARY STENT INTERVENTION N/A 05/12/2021  ? Procedure: CORONARY STENT INTERVENTION;  Surgeon: Nelva Bush, MD;  Location: [place] CV LAB;  Service: Cardiovascular;  Laterality: N/A;  ? DILATION AND CURETTAGE OF UTERUS    ?  ESOPHAGOGASTRODUODENOSCOPY (EGD) WITH PROPOFOL N/A 09/14/2014  ? Procedure: ESOPHAGOGASTRODUODENOSCOPY (EGD) WITH PROPOFOL;  Surgeon: Josefine Class, MD;  Location: [facility];  Service: Endoscopy;  Laterality: N/A;  ? ESOPHA

## 2021-05-20 NOTE — Patient Instructions (Signed)
Medication Instructions:  ?- Your physician has recommended you make the following change in your medication:  ? ?1) DECREASE amlodipine 10 mg: ?- take 0.5 (5 mg) by mouth once daily  ? ?2) START metoprolol tartrate 25 mg: ?- take 1 tablet (25 mg) by mouth TWICE daily  ?you may start out taking 0.5 tablet (12.5 mg) by mouth TWICE daily to see how your blood pressure tolerates this ? ?*If you need a refill on your cardiac medications before your next appointment, please call your pharmacy* ? ? ?Lab Work: ?- none ordered ? ?If you have labs (blood work) drawn today and your tests are completely normal, you will receive your results only by: ?MyChart Message (if you have MyChart) OR ?A paper copy in the mail ?If you have any lab test that is abnormal or we need to change your treatment, we will call you to review the results. ? ? ?Testing/Procedures: ?- none ordered ? ? ?Follow-Up: ?At [facility], you and your health needs are our priority.  As part of our continuing mission to provide you with exceptional heart care, we have created designated Provider Care Teams.  These Care Teams include your primary Cardiologist (physician) and Advanced Practice Providers (APPs -  Physician Assistants and Nurse Practitioners) who all work together to provide you with the care you need, when you need it. ? ?We recommend signing up for the patient portal called "MyChart".  Sign up information is provided on this After Visit Summary.  MyChart is used to connect with patients for Virtual Visits (Telemedicine).  Patients are able to view lab/test results, encounter notes, upcoming appointments, etc.  Non-urgent messages can be sent to your provider as well.   ?To learn more about what you can do with MyChart, go to [email or web address].   ? ?Your next appointment:   ?1) As scheduled with Dr. [name]  ? ?2) 3 months with Dr. Caryl Comes ? ?The format for your next appointment:   ?In Person ? ?Provider:   ?As above  ? ? ?Other  Instructions ?N/a ? ?Important Information About Sugar ? ? ? ? ? ? ?

## 2021-05-20 NOTE — Progress Notes (Deleted)
Established patient visit   Patient: [name]   DOB: 1970/10/26   51 y.o. Female  MRN: [id number] Visit Date: 05/20/2021  Today's healthcare provider: [name], MD   No chief complaint on file.  Subjective    HPI  Follow up Hospitalization  Patient was admitted to [facility] on 05-08-21 and discharged on 05-13-21. She was treated for chest pain.Treatment for this include: CT scan showed a thickened esophageal mucosa.  Chest pain could be partially caused by esophagitis.  Continue Protonix twice a day, also added sucralfate. Patient referred to GI after discharge Telephone follow up was done on 05-14-21 She reports {excellent/good/fair:19992} compliance with treatment. She reports this condition is {resolved/improved/worsened:23923}.  ----------------------------------------------------------------------------------------- -   Medications: Outpatient Medications Prior to Visit  Medication Sig   AIMOVIG 140 MG/ML SOAJ Inject 140 mg into the skin every 28 (twenty-eight) days.   ALPRAZolam (XANAX) 1 MG tablet Take 1 tablet by mouth twice daily   amLODipine (NORVASC) 10 MG tablet Take 1 tablet (10 mg total) by mouth daily.   aspirin EC 81 MG EC tablet Take 1 tablet (81 mg total) by mouth daily. Swallow whole.   Baclofen 5 MG TABS Take 5 mg by mouth 2 (two) times daily as needed (muscle spasms).   clopidogrel (PLAVIX) 75 MG tablet Take 1 tablet (75 mg total) by mouth daily.   desvenlafaxine (PRISTIQ) 50 MG 24 hr tablet Take 50 mg by mouth daily.   diclofenac Sodium (VOLTAREN) 1 % GEL Apply 2 g topically 4 (four) times daily as needed (joint paim).   diphenoxylate-atropine (LOMOTIL) 2.5-0.025 MG tablet Take 1 tablet by mouth 4 (four) times daily as needed for diarrhea or loose stools.   estradiol (ESTRACE) 0.1 MG/GM vaginal cream Place 1 Applicatorful vaginally 3 (three) times a week. Estrogen Cream Instruction Discard applicator Apply pea sized amount to tip of finger to  urethra before bed. Wash hands well after application. Use Monday, Wednesday and Friday   gabapentin (NEURONTIN) 300 MG capsule Take 2 capsules (600 mg total) by mouth 2 (two) times daily.   isosorbide mononitrate (IMDUR) 30 MG 24 hr tablet Take 0.5 tablets (15 mg total) by mouth daily.   lacosamide (VIMPAT) 200 MG TABS tablet Take 1 tablet (200 mg total) by mouth 2 (two) times daily.   levETIRAcetam (KEPPRA) 1000 MG tablet Take 1 tablet (1,000 mg total) by mouth 2 (two) times daily.   levothyroxine (SYNTHROID) 25 MCG tablet Take 1 tablet by mouth 30 minutes before breakfast (Patient taking differently: Take 25 mcg by mouth daily before breakfast.)   metoprolol tartrate (LOPRESSOR) 25 MG tablet Take 1 tablet (25 mg total) by mouth 2 (two) times daily.   mirabegron ER (MYRBETRIQ) 50 MG TB24 tablet Take 1 tablet (50 mg total) by mouth daily.   mirtazapine (REMERON) 7.5 MG tablet Take 1 tablet (7.5 mg total) by mouth at bedtime.   nitroGLYCERIN (NITROSTAT) 0.4 MG SL tablet Dissolve 1 tablet under tongue every 2mns, up to 3 doses, for chest pain. Max 3 doses in 366ms. (Patient taking differently: Place 0.4 mg under the tongue every 5 (five) minutes x 3 doses as needed for chest pain.)   ofloxacin (OCUFLOX) 0.3 % ophthalmic solution 1 drop 4 (four) times daily.   pantoprazole (PROTONIX) 40 MG tablet Take 1 tablet by mouth twice daily   Polyethyl Glycol-Propyl Glycol (SYSTANE OP) Place 1 drop into both eyes daily as needed (dry eyes).   prednisoLONE acetate (PRED FORTE)  1 % ophthalmic suspension 1 drop 4 (four) times daily.   promethazine (PHENERGAN) 25 MG tablet Take 1 tablet by mouth every 6 hours as needed for nausea or vomiting.   ranolazine (RANEXA) 500 MG 12 hr tablet Take 1 tablet (500 mg total) by mouth 2 (two) times daily.   Rimegepant Sulfate (NURTEC) 75 MG TBDP Take 75 mg by mouth as needed. (Patient taking differently: Take 75 mg by mouth as needed (migraine).)   rosuvastatin (CRESTOR) 40 MG  tablet Take 1 tablet (40 mg total) by mouth daily at 6 PM.   No facility-administered medications prior to visit.    Review of Systems  {Labs  Heme  Chem  Endocrine  Serology  Results Review (optional):23779}   Objective    There were no vitals taken for this visit. {Show previous vital signs (optional):23777}  Physical Exam  ***  No results found for any visits on 05/20/21.  Assessment & Plan     ***  No follow-ups on file.      {provider attestation***:1}   [name], MD  [facility] [phone] (phone) [phone] (fax)  [place]

## 2021-05-22 IMAGING — US US RENAL
1 series · 14 of 25 positions shown · non-contrast
Comparison: Ultrasound 10/05/2018, 04/19/2019

CLINICAL DATA: Bilateral flank pain

EXAM:
RENAL / URINARY TRACT ULTRASOUND COMPLETE

[Series 1: us renal · 14 of 79 slices shown]
[im 1/79]
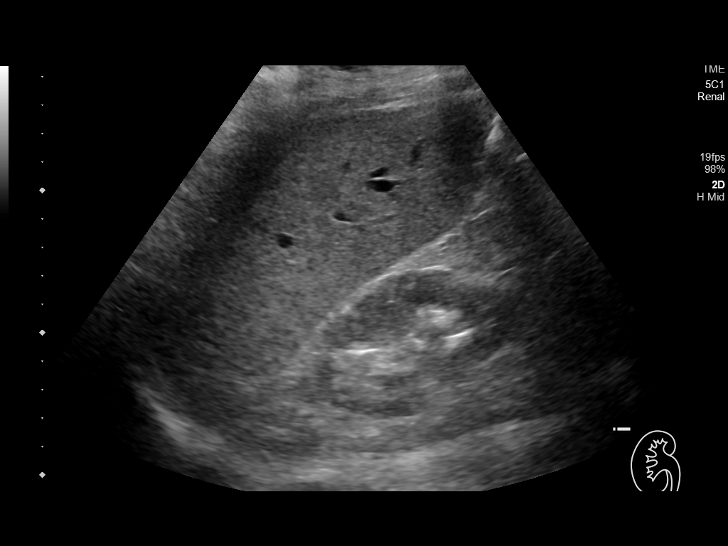
[im 7/79]
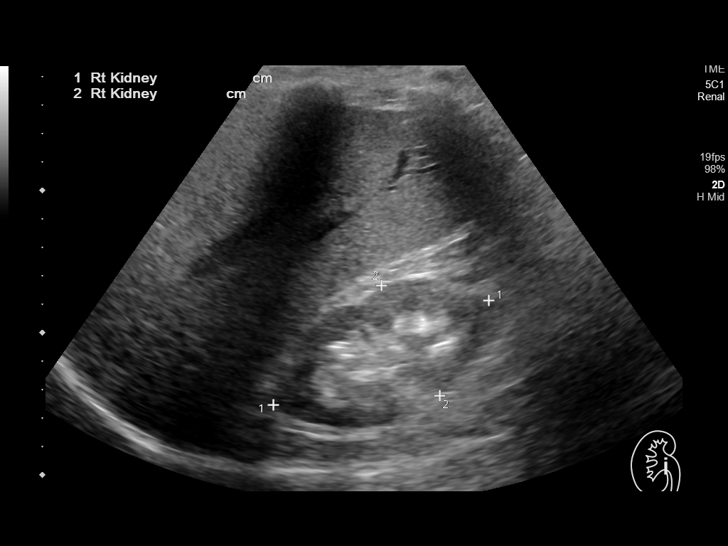
[im 14/79]
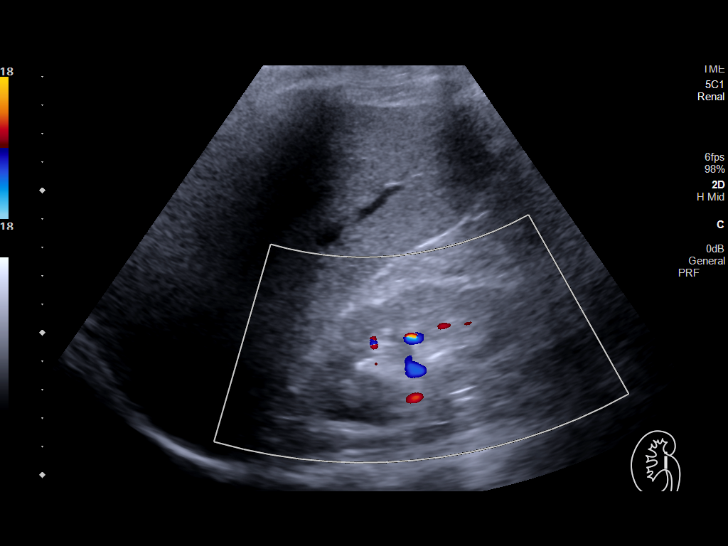
[im 20/79]
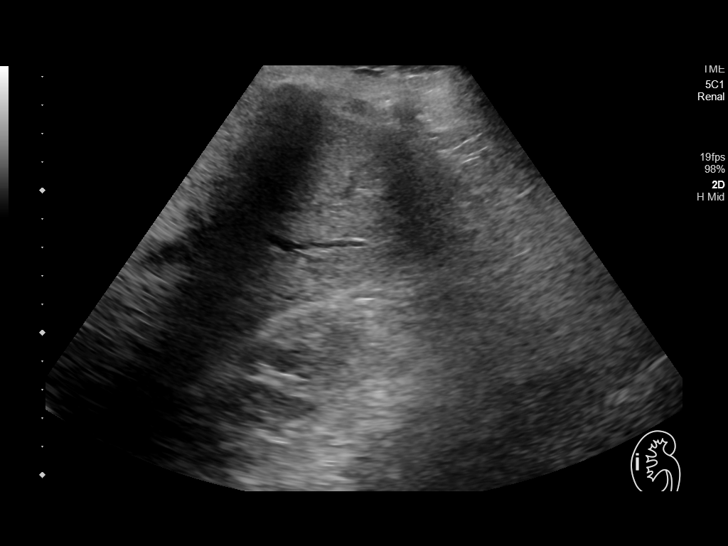
[im 27/79]
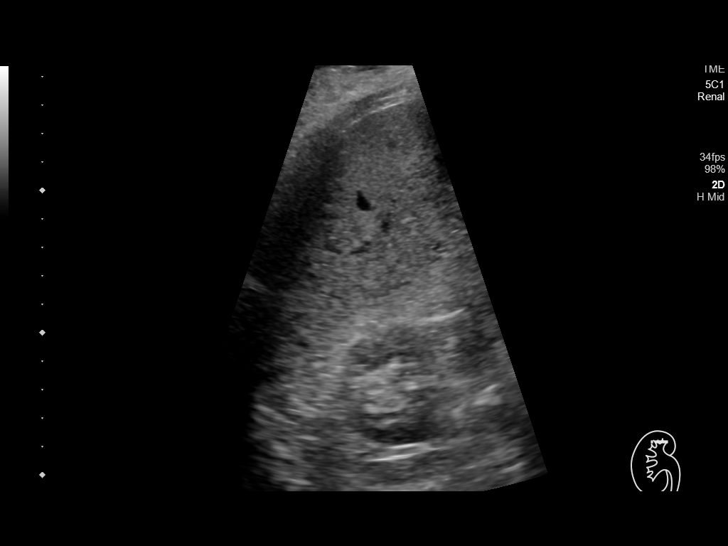
[im 30/79]
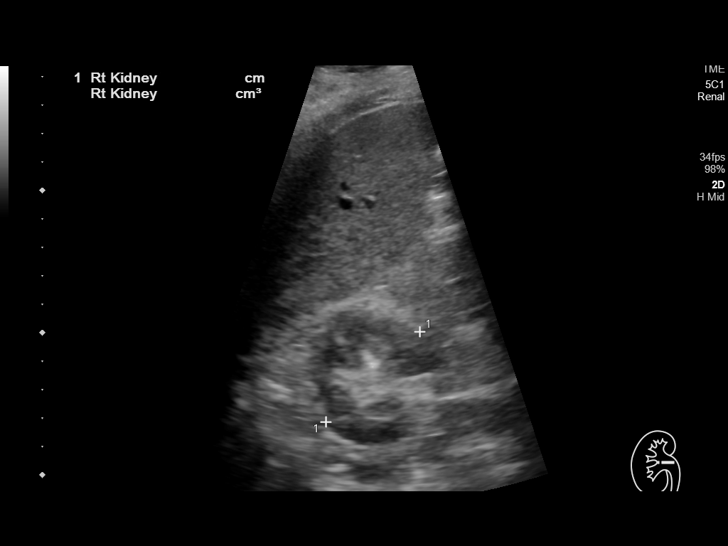
[im 36/79]
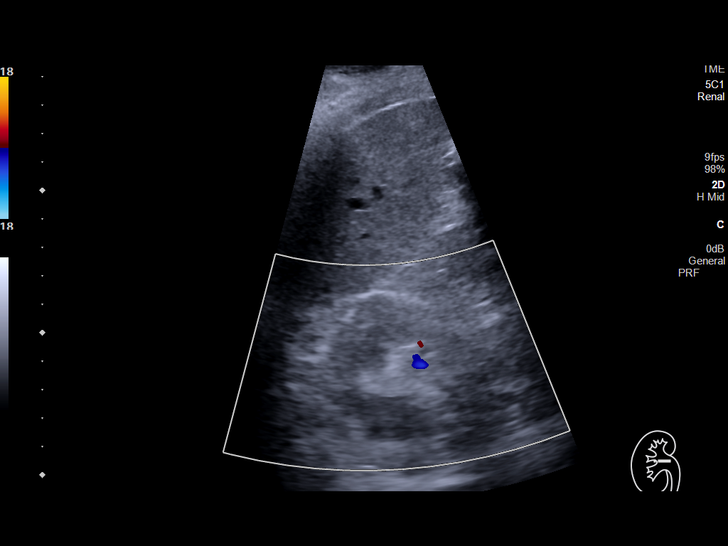
[im 43/79]
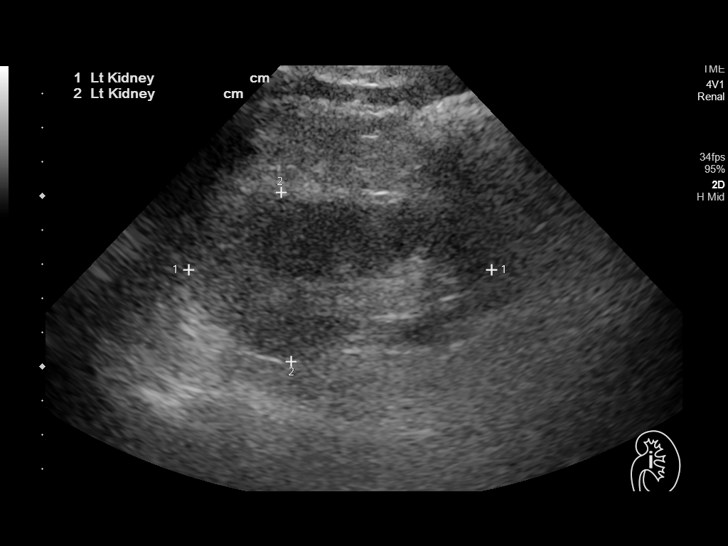
[im 49/79]
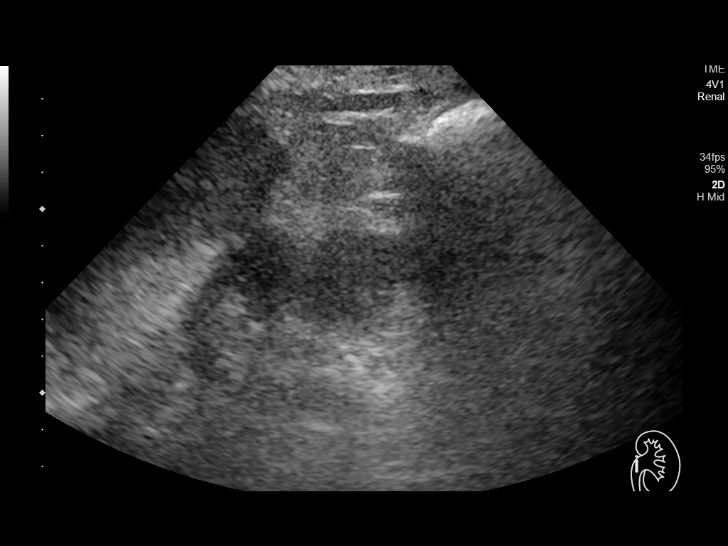
[im 53/79]
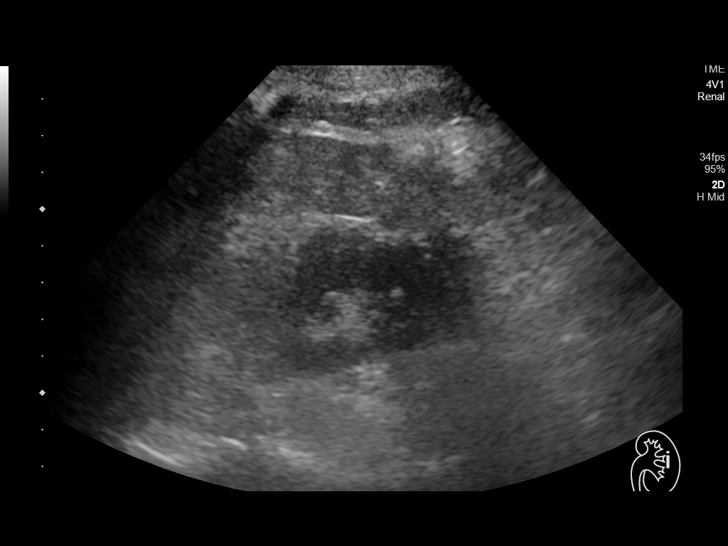
[im 59/79]
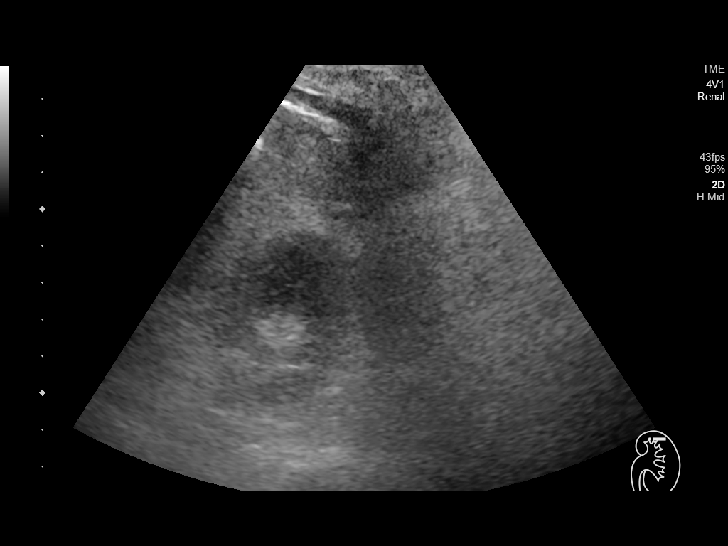
[im 66/79]
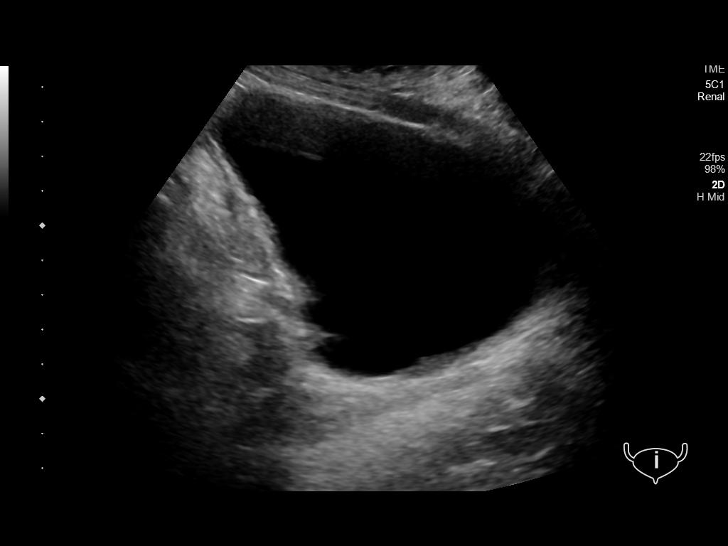
[im 72/79]
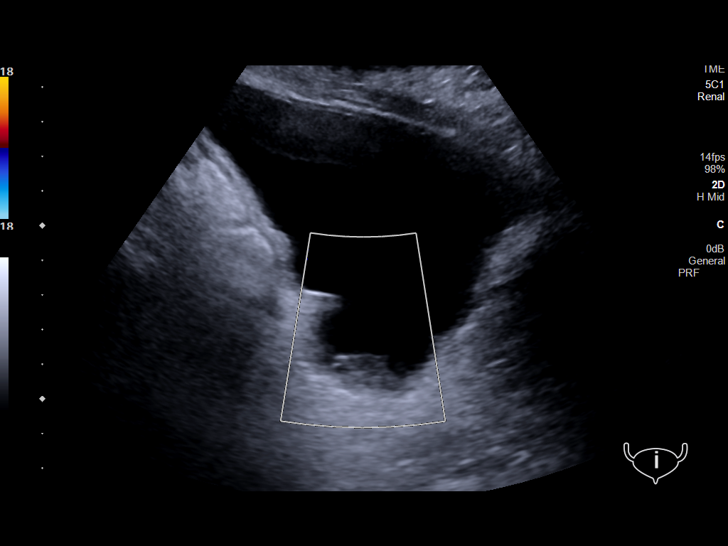
[im 79/79]
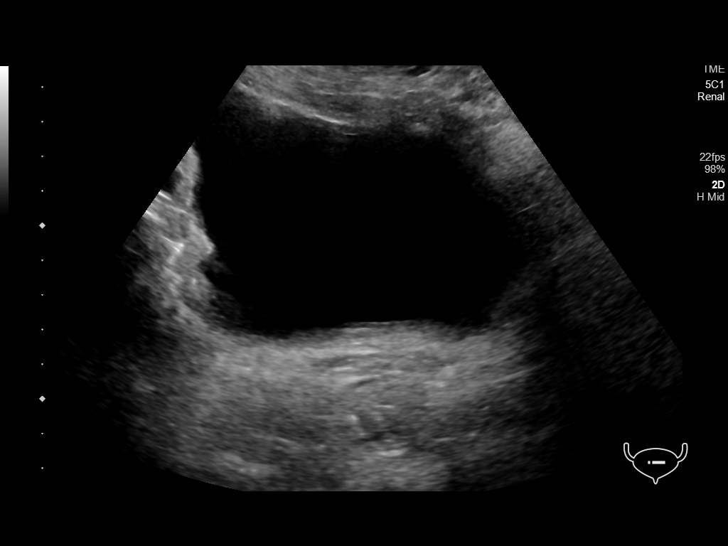

[14 of 25 positions shown; findings below may reference images not displayed]

FINDINGS: Right Kidney:

Renal measurements: 8.4 x 4.4 x 4.6 cm = volume: 88.3 mL .
Echogenicity within normal limits. No mass or hydronephrosis
visualized.

Left Kidney:

Renal measurements: 8.9 x 5 x 4.8 cm = volume: 111.2 mL.
Echogenicity within normal limits. No mass or hydronephrosis
visualized.

Bladder:

Hypoechoic area near the right posterior bladder measuring 2.5 x
x 2.4 cm. No significant internal vascularity.

Other:

None.
IMPRESSION: 1. Negative for hydronephrosis. Ultrasound appearance of the kidneys
is within normal limits
2. 2.5 cm hypoechoic area within the right posterior bladder without
significant vascularity. This could represent a focal collection of
debris versus hypovascular soft tissue/mass. Suggest correlation
with urinalysis with possible follow-up direct visualization.

## 2021-05-23 ENCOUNTER — Observation Stay
Admission: EM | Admit: 2021-05-23 | Discharge: 2021-05-26 | Disposition: A | Discharge status: Home Health | Payer: Medicare Other | Attending: Internal Medicine | Admitting: Internal Medicine

## 2021-05-23 ENCOUNTER — Emergency Department: Payer: Medicare Other

## 2021-05-23 ENCOUNTER — Other Ambulatory Visit: Payer: Self-pay

## 2021-05-23 DIAGNOSIS — Z79899 Other long term (current) drug therapy: Secondary | ICD-10-CM | POA: Diagnosis not present

## 2021-05-23 DIAGNOSIS — E1129 Type 2 diabetes mellitus with other diabetic kidney complication: Secondary | ICD-10-CM | POA: Diagnosis present

## 2021-05-23 DIAGNOSIS — N1832 Chronic kidney disease, stage 3b: Secondary | ICD-10-CM | POA: Diagnosis not present

## 2021-05-23 DIAGNOSIS — I5032 Chronic diastolic (congestive) heart failure: Secondary | ICD-10-CM | POA: Insufficient documentation

## 2021-05-23 DIAGNOSIS — R2681 Unsteadiness on feet: Secondary | ICD-10-CM | POA: Insufficient documentation

## 2021-05-23 DIAGNOSIS — E039 Hypothyroidism, unspecified: Secondary | ICD-10-CM | POA: Diagnosis not present

## 2021-05-23 DIAGNOSIS — E11649 Type 2 diabetes mellitus with hypoglycemia without coma: Secondary | ICD-10-CM | POA: Insufficient documentation

## 2021-05-23 DIAGNOSIS — R569 Unspecified convulsions: Secondary | ICD-10-CM

## 2021-05-23 DIAGNOSIS — Y92009 Unspecified place in unspecified non-institutional (private) residence as the place of occurrence of the external cause: Secondary | ICD-10-CM | POA: Diagnosis not present

## 2021-05-23 DIAGNOSIS — G9341 Metabolic encephalopathy: Principal | ICD-10-CM | POA: Insufficient documentation

## 2021-05-23 DIAGNOSIS — Z955 Presence of coronary angioplasty implant and graft: Secondary | ICD-10-CM | POA: Diagnosis not present

## 2021-05-23 DIAGNOSIS — R7989 Other specified abnormal findings of blood chemistry: Secondary | ICD-10-CM | POA: Diagnosis present

## 2021-05-23 DIAGNOSIS — I10 Essential (primary) hypertension: Secondary | ICD-10-CM | POA: Diagnosis present

## 2021-05-23 DIAGNOSIS — R7402 Elevation of levels of lactic acid dehydrogenase (LDH): Secondary | ICD-10-CM | POA: Insufficient documentation

## 2021-05-23 DIAGNOSIS — I2510 Atherosclerotic heart disease of native coronary artery without angina pectoris: Secondary | ICD-10-CM | POA: Insufficient documentation

## 2021-05-23 DIAGNOSIS — I4891 Unspecified atrial fibrillation: Secondary | ICD-10-CM | POA: Insufficient documentation

## 2021-05-23 DIAGNOSIS — E162 Hypoglycemia, unspecified: Secondary | ICD-10-CM | POA: Diagnosis present

## 2021-05-23 DIAGNOSIS — Z7902 Long term (current) use of antithrombotics/antiplatelets: Secondary | ICD-10-CM | POA: Diagnosis not present

## 2021-05-23 DIAGNOSIS — R4182 Altered mental status, unspecified: Secondary | ICD-10-CM

## 2021-05-23 DIAGNOSIS — E785 Hyperlipidemia, unspecified: Secondary | ICD-10-CM | POA: Diagnosis present

## 2021-05-23 DIAGNOSIS — Z85828 Personal history of other malignant neoplasm of skin: Secondary | ICD-10-CM | POA: Diagnosis not present

## 2021-05-23 DIAGNOSIS — F418 Other specified anxiety disorders: Secondary | ICD-10-CM | POA: Diagnosis present

## 2021-05-23 DIAGNOSIS — I130 Hypertensive heart and chronic kidney disease with heart failure and stage 1 through stage 4 chronic kidney disease, or unspecified chronic kidney disease: Secondary | ICD-10-CM | POA: Insufficient documentation

## 2021-05-23 DIAGNOSIS — W19XXXA Unspecified fall, initial encounter: Secondary | ICD-10-CM

## 2021-05-23 DIAGNOSIS — Z7982 Long term (current) use of aspirin: Secondary | ICD-10-CM | POA: Insufficient documentation

## 2021-05-23 DIAGNOSIS — W1839XA Other fall on same level, initial encounter: Secondary | ICD-10-CM | POA: Diagnosis not present

## 2021-05-23 DIAGNOSIS — E2740 Unspecified adrenocortical insufficiency: Secondary | ICD-10-CM

## 2021-05-23 DIAGNOSIS — E1122 Type 2 diabetes mellitus with diabetic chronic kidney disease: Secondary | ICD-10-CM | POA: Diagnosis not present

## 2021-05-23 DIAGNOSIS — Z8673 Personal history of transient ischemic attack (TIA), and cerebral infarction without residual deficits: Secondary | ICD-10-CM | POA: Diagnosis not present

## 2021-05-23 DIAGNOSIS — Z8639 Personal history of other endocrine, nutritional and metabolic disease: Secondary | ICD-10-CM | POA: Diagnosis present

## 2021-05-23 LAB — PROTIME-INR
INR: 1.1 (ref 0.8–1.2)
Prothrombin Time: 14 seconds (ref 11.4–15.2)

## 2021-05-23 LAB — TSH: TSH: 2.213 u[IU]/mL (ref 0.350–4.500)

## 2021-05-23 LAB — COMPREHENSIVE METABOLIC PANEL
ALT: 51 U/L — ABNORMAL HIGH (ref 0–44)
AST: 50 U/L — ABNORMAL HIGH (ref 15–41)
Albumin: 2.2 g/dL — ABNORMAL LOW (ref 3.5–5.0)
Alkaline Phosphatase: 114 U/L (ref 38–126)
Anion gap: 9 (ref 5–15)
BUN: 16 mg/dL (ref 6–20)
CO2: 17 mmol/L — ABNORMAL LOW (ref 22–32)
Calcium: 7.1 mg/dL — ABNORMAL LOW (ref 8.9–10.3)
Chloride: 114 mmol/L — ABNORMAL HIGH (ref 98–111)
Creatinine, Ser: 1.06 mg/dL — ABNORMAL HIGH (ref 0.44–1.00)
GFR, Estimated: >60 mL/min (ref >60)
Glucose, Bld: 60 mg/dL — ABNORMAL LOW (ref 70–99)
Potassium: 4.7 mmol/L (ref 3.5–5.1)
Sodium: 140 mmol/L (ref 135–145)
Total Bilirubin: 0.6 mg/dL (ref 0.3–1.2)
Total Protein: 4.3 g/dL — ABNORMAL LOW (ref 6.5–8.1)

## 2021-05-23 LAB — URINE DRUG SCREEN, QUALITATIVE (ARMC ONLY)
Amphetamines, Ur Screen: NOT DETECTED
Barbiturates, Ur Screen: NOT DETECTED
Benzodiazepine, Ur Scrn: POSITIVE — AB
Cannabinoid 50 Ng, Ur ~~LOC~~: NOT DETECTED
Cocaine Metabolite,Ur ~~LOC~~: NOT DETECTED
MDMA (Ecstasy)Ur Screen: NOT DETECTED
Methadone Scn, Ur: NOT DETECTED
Opiate, Ur Screen: NOT DETECTED
Phencyclidine (PCP) Ur S: NOT DETECTED
Tricyclic, Ur Screen: NOT DETECTED

## 2021-05-23 LAB — URINALYSIS, COMPLETE (UACMP) WITH MICROSCOPIC
Bilirubin Urine: NEGATIVE
Glucose, UA: NEGATIVE mg/dL
Hgb urine dipstick: NEGATIVE
Ketones, ur: NEGATIVE mg/dL
Leukocytes,Ua: NEGATIVE
Nitrite: NEGATIVE
Protein, ur: 100 mg/dL — AB
Specific Gravity, Urine: 1.006 (ref 1.005–1.030)
pH: 5 (ref 5.0–8.0)

## 2021-05-23 LAB — CBC WITH DIFFERENTIAL/PLATELET
Abs Immature Granulocytes: 0.02 10*3/uL (ref 0.00–0.07)
Basophils Absolute: 0.1 10*3/uL (ref 0.0–0.1)
Basophils Relative: 2 %
Eosinophils Absolute: 0.2 10*3/uL (ref 0.0–0.5)
Eosinophils Relative: 3 %
HCT: 40.6 % (ref 36.0–46.0)
Hemoglobin: 12.1 g/dL (ref 12.0–15.0)
Immature Granulocytes: 0 %
Lymphocytes Relative: 19 %
Lymphs Abs: 1.4 10*3/uL (ref 0.7–4.0)
MCH: 26 pg (ref 26.0–34.0)
MCHC: 29.8 g/dL — ABNORMAL LOW (ref 30.0–36.0)
MCV: 87.1 fL (ref 80.0–100.0)
Monocytes Absolute: 0.5 10*3/uL (ref 0.1–1.0)
Monocytes Relative: 6 %
Neutro Abs: 5.2 10*3/uL (ref 1.7–7.7)
Neutrophils Relative %: 70 %
Platelets: 141 10*3/uL — ABNORMAL LOW (ref 150–400)
RBC: 4.66 MIL/uL (ref 3.87–5.11)
RDW: 14.1 % (ref 11.5–15.5)
WBC: 7.3 10*3/uL (ref 4.0–10.5)
nRBC: 0 % (ref 0.0–0.2)

## 2021-05-23 LAB — LACTIC ACID, PLASMA
Lactic Acid, Venous: 2 mmol/L (ref 0.5–1.9)
Lactic Acid, Venous: 2.1 mmol/L (ref 0.5–1.9)
Lactic Acid, Venous: 1.5 mmol/L (ref 0.5–1.9)
Lactic Acid, Venous: 2.3 mmol/L (ref 0.5–1.9)

## 2021-05-23 LAB — CBG MONITORING, ED
Glucose-Capillary: 60 mg/dL — ABNORMAL LOW (ref 70–99)
Glucose-Capillary: 181 mg/dL — ABNORMAL HIGH (ref 70–99)

## 2021-05-23 LAB — APTT: aPTT: 35 seconds (ref 24–36)

## 2021-05-23 LAB — AMMONIA: Ammonia: 43 umol/L — ABNORMAL HIGH (ref 9–35)

## 2021-05-23 LAB — GLUCOSE, CAPILLARY
Glucose-Capillary: 120 mg/dL — ABNORMAL HIGH (ref 70–99)
Glucose-Capillary: 225 mg/dL — ABNORMAL HIGH (ref 70–99)

## 2021-05-23 LAB — BRAIN NATRIURETIC PEPTIDE: B Natriuretic Peptide: 53.1 pg/mL (ref 0.0–100.0)

## 2021-05-23 MED ORDER — NITROGLYCERIN 2 % TD OINT
1.0000 [in_us] | TOPICAL_OINTMENT | Freq: Once | TRANSDERMAL | Status: AC
Start: 2021-05-23 — End: 2021-05-23
  Administered 2021-05-23: 1 [in_us] via TOPICAL
  Filled 2021-05-23: qty 1

## 2021-05-23 MED ORDER — BACLOFEN 10 MG PO TABS
5.0000 mg | ORAL_TABLET | Freq: Two times a day (BID) | ORAL | Status: DC | PRN
  Filled 2021-05-23: qty 0.5

## 2021-05-23 MED ORDER — PREDNISOLONE ACETATE 1 % OP SUSP
1.0000 [drp] | Freq: Three times a day (TID) | OPHTHALMIC | Status: DC
  Administered 2021-05-23 – 2021-05-26 (×8): 1 [drp] via OPHTHALMIC
  Filled 2021-05-23: qty 1

## 2021-05-23 MED ORDER — SODIUM CHLORIDE 0.9 % IV SOLN: INTRAVENOUS | Status: DC

## 2021-05-23 MED ORDER — NITROGLYCERIN 0.4 MG SL SUBL: 0.4000 mg | SUBLINGUAL_TABLET | SUBLINGUAL | Status: DC | PRN

## 2021-05-23 MED ORDER — HYDRALAZINE HCL 20 MG/ML IJ SOLN
5.0000 mg | INTRAMUSCULAR | Status: DC | PRN
  Administered 2021-05-23: 5 mg via INTRAVENOUS
  Filled 2021-05-23: qty 1

## 2021-05-23 MED ORDER — POLYVINYL ALCOHOL 1.4 % OP SOLN: 1.0000 [drp] | OPHTHALMIC | Status: DC | PRN

## 2021-05-23 MED ORDER — LACTATED RINGERS IV BOLUS
1000.0000 mL | Freq: Once | INTRAVENOUS | Status: AC
  Administered 2021-05-23: 1000 mL via INTRAVENOUS

## 2021-05-23 MED ORDER — AMLODIPINE BESYLATE 5 MG PO TABS
5.0000 mg | ORAL_TABLET | Freq: Every day | ORAL | Status: DC
  Administered 2021-05-23 – 2021-05-26 (×4): 5 mg via ORAL
  Filled 2021-05-23 (×4): qty 1

## 2021-05-23 MED ORDER — LACOSAMIDE 50 MG PO TABS
200.0000 mg | ORAL_TABLET | Freq: Two times a day (BID) | ORAL | Status: DC
  Administered 2021-05-23 – 2021-05-26 (×6): 200 mg via ORAL
  Filled 2021-05-23 (×6): qty 4

## 2021-05-23 MED ORDER — LEVOTHYROXINE SODIUM 25 MCG PO TABS
25.0000 ug | ORAL_TABLET | Freq: Every day | ORAL | Status: DC
  Administered 2021-05-25 – 2021-05-26 (×2): 25 ug via ORAL
  Filled 2021-05-23 (×2): qty 1

## 2021-05-23 MED ORDER — RANOLAZINE ER 500 MG PO TB12
500.0000 mg | ORAL_TABLET | Freq: Two times a day (BID) | ORAL | Status: DC
  Administered 2021-05-24 – 2021-05-25 (×2): 500 mg via ORAL
  Filled 2021-05-23 (×6): qty 1

## 2021-05-23 MED ORDER — CLOPIDOGREL BISULFATE 75 MG PO TABS
75.0000 mg | ORAL_TABLET | Freq: Every day | ORAL | Status: DC
  Administered 2021-05-24 – 2021-05-26 (×3): 75 mg via ORAL
  Filled 2021-05-23 (×3): qty 1

## 2021-05-23 MED ORDER — PANTOPRAZOLE SODIUM 40 MG PO TBEC
40.0000 mg | DELAYED_RELEASE_TABLET | Freq: Two times a day (BID) | ORAL | Status: DC
  Administered 2021-05-23 – 2021-05-26 (×6): 40 mg via ORAL
  Filled 2021-05-23 (×6): qty 1

## 2021-05-23 MED ORDER — ENOXAPARIN SODIUM 40 MG/0.4ML IJ SOSY
40.0000 mg | PREFILLED_SYRINGE | INTRAMUSCULAR | Status: DC
  Administered 2021-05-23 – 2021-05-25 (×3): 40 mg via SUBCUTANEOUS
  Filled 2021-05-23 (×3): qty 0.4

## 2021-05-23 MED ORDER — ALPRAZOLAM 1 MG PO TABS
1.0000 mg | ORAL_TABLET | Freq: Two times a day (BID) | ORAL | Status: DC
  Administered 2021-05-23 – 2021-05-26 (×6): 1 mg via ORAL
  Filled 2021-05-23 (×6): qty 1

## 2021-05-23 MED ORDER — LORAZEPAM 2 MG/ML IJ SOLN: 1.0000 mg | INTRAMUSCULAR | Status: DC | PRN

## 2021-05-23 MED ORDER — ROSUVASTATIN CALCIUM 20 MG PO TABS
40.0000 mg | ORAL_TABLET | Freq: Every day | ORAL | Status: DC
  Administered 2021-05-23 – 2021-05-25 (×3): 40 mg via ORAL
  Filled 2021-05-23 (×3): qty 2

## 2021-05-23 MED ORDER — DICLOFENAC SODIUM 1 % EX GEL: 2.0000 g | Freq: Four times a day (QID) | CUTANEOUS | Status: DC | PRN

## 2021-05-23 MED ORDER — DEXTROSE 50 % IV SOLN: 50.0000 mL | INTRAVENOUS | Status: DC | PRN

## 2021-05-23 MED ORDER — ONDANSETRON HCL 4 MG/2ML IJ SOLN
4.0000 mg | Freq: Once | INTRAMUSCULAR | Status: AC
Start: 2021-05-23 — End: 2021-05-23
  Administered 2021-05-23: 4 mg via INTRAVENOUS
  Filled 2021-05-23: qty 2

## 2021-05-23 MED ORDER — ACETAMINOPHEN 325 MG RE SUPP
650.0000 mg | Freq: Once | RECTAL | Status: AC
  Administered 2021-05-23: 650 mg via RECTAL
  Filled 2021-05-23: qty 2

## 2021-05-23 MED ORDER — POLYETHYL GLYCOL-PROPYL GLYCOL 0.4-0.3 % OP GEL: Freq: Every day | OPHTHALMIC | Status: DC | PRN

## 2021-05-23 MED ORDER — OFLOXACIN 0.3 % OP SOLN
1.0000 [drp] | Freq: Three times a day (TID) | OPHTHALMIC | Status: DC
  Administered 2021-05-23 – 2021-05-26 (×8): 1 [drp] via OPHTHALMIC
  Filled 2021-05-23: qty 5

## 2021-05-23 MED ORDER — ASPIRIN EC 81 MG PO TBEC
81.0000 mg | DELAYED_RELEASE_TABLET | Freq: Every day | ORAL | Status: DC
  Administered 2021-05-23 – 2021-05-26 (×4): 81 mg via ORAL
  Filled 2021-05-23 (×4): qty 1

## 2021-05-23 MED ORDER — DIPHENOXYLATE-ATROPINE 2.5-0.025 MG PO TABS
1.0000 | ORAL_TABLET | Freq: Four times a day (QID) | ORAL | Status: DC | PRN
  Administered 2021-05-23: 1 via ORAL
  Filled 2021-05-23 (×2): qty 1

## 2021-05-23 MED ORDER — MIRABEGRON ER 50 MG PO TB24
50.0000 mg | ORAL_TABLET | Freq: Every day | ORAL | Status: DC
  Administered 2021-05-24 – 2021-05-26 (×3): 50 mg via ORAL
  Filled 2021-05-23 (×3): qty 1

## 2021-05-23 MED ORDER — MIRTAZAPINE 15 MG PO TABS
7.5000 mg | ORAL_TABLET | Freq: Every day | ORAL | Status: DC
Start: 2021-05-23 — End: 2021-05-26
  Administered 2021-05-23 – 2021-05-25 (×3): 7.5 mg via ORAL
  Filled 2021-05-23 (×3): qty 1

## 2021-05-23 MED ORDER — ISOSORBIDE MONONITRATE ER 30 MG PO TB24
15.0000 mg | ORAL_TABLET | Freq: Every day | ORAL | Status: DC
  Administered 2021-05-24 – 2021-05-26 (×3): 15 mg via ORAL
  Filled 2021-05-23 (×3): qty 1

## 2021-05-23 MED ORDER — RIMEGEPANT SULFATE 75 MG PO TBDP: 75.0000 mg | ORAL_TABLET | ORAL | Status: DC | PRN

## 2021-05-23 MED ORDER — ACETAMINOPHEN 325 MG PO TABS
650.0000 mg | ORAL_TABLET | Freq: Four times a day (QID) | ORAL | Status: DC | PRN
Start: 2021-05-23 — End: 2021-05-26
  Administered 2021-05-23: 650 mg via ORAL
  Filled 2021-05-23 (×2): qty 2

## 2021-05-23 MED ORDER — LEVETIRACETAM 500 MG PO TABS
1000.0000 mg | ORAL_TABLET | Freq: Two times a day (BID) | ORAL | Status: DC
  Administered 2021-05-23 – 2021-05-26 (×7): 1000 mg via ORAL
  Filled 2021-05-23 (×7): qty 2

## 2021-05-23 MED ORDER — GABAPENTIN 300 MG PO CAPS
600.0000 mg | ORAL_CAPSULE | Freq: Two times a day (BID) | ORAL | Status: DC
  Administered 2021-05-23 – 2021-05-26 (×6): 600 mg via ORAL
  Filled 2021-05-23 (×6): qty 2

## 2021-05-23 MED ORDER — VENLAFAXINE HCL ER 75 MG PO CP24
75.0000 mg | ORAL_CAPSULE | Freq: Every day | ORAL | Status: DC
  Administered 2021-05-24 – 2021-05-26 (×3): 75 mg via ORAL
  Filled 2021-05-23 (×3): qty 1

## 2021-05-23 MED ORDER — ONDANSETRON HCL 4 MG/2ML IJ SOLN
4.0000 mg | Freq: Three times a day (TID) | INTRAMUSCULAR | Status: DC | PRN
Start: 2021-05-23 — End: 2021-05-26

## 2021-05-23 NOTE — ED Notes (Signed)
Dr. Cinda Quest updated on lactic acid redraw, lactic acid 2.1. ? ?

## 2021-05-23 NOTE — Progress Notes (Signed)
Eeg done 

## 2021-05-23 NOTE — Assessment & Plan Note (Signed)
-   Crestor 

## 2021-05-23 NOTE — Assessment & Plan Note (Addendum)
-   Continue home medications 

## 2021-05-23 NOTE — Assessment & Plan Note (Addendum)
Stable

## 2021-05-23 NOTE — Procedures (Signed)
Patient Name: [facility]  ?MRN: [id number]  ?Epilepsy Attending: Lora Havens  ?Referring Physician/Provider: Ivor Costa, MD ?Date: 05/23/2021 ?Duration: 36.35 mins ? ?Patient history: 51yo f with h/o seizure, admitted with ams. EEG to evaluate for seizure.  ? ?Level of alertness: Awake ? ?AEDs during EEG study: LEV, LCM, GBP,  xanax ? ?Technical aspects: This EEG study was done with scalp electrodes positioned according to the 10-20 International system of electrode placement. Electrical activity was acquired at a sampling rate of '500Hz'$  and reviewed with a high frequency filter of '70Hz'$  and a low frequency filter of '1Hz'$ . EEG data were recorded continuously and digitally stored.  ? ?Description: The posterior dominant rhythm consists of 9 Hz activity of moderate voltage (25-35 uV) seen predominantly in posterior head regions, symmetric and reactive to eye opening and eye closing. Physiologic photic driving was not seen during photic stimulation.  Hyperventilation was not performed.    ? ?IMPRESSION: ?This study is within normal limits. No seizures or epileptiform discharges were seen throughout the recording. ? ?Lora Havens  ? ?

## 2021-05-23 NOTE — Assessment & Plan Note (Addendum)
EEG did not show any seizure activity ?

## 2021-05-23 NOTE — ED Notes (Signed)
Dr. Cinda Quest notified pt's high BP persists, see MAR. ?

## 2021-05-23 NOTE — ED Notes (Signed)
External urinary cath applied. ?

## 2021-05-23 NOTE — ED Notes (Signed)
Patient transported to EEG

## 2021-05-23 NOTE — ED Notes (Signed)
Pt. Passed swallow screen, Provided sandwich tray. MD ok'd. Pt. Consuming sandwich without difficulty, choking, drooling etc. ?

## 2021-05-23 NOTE — Assessment & Plan Note (Addendum)
Resume home regimen. 

## 2021-05-23 NOTE — Assessment & Plan Note (Addendum)
Patient has been having for at home.  We will send PT as outpatient ?

## 2021-05-23 NOTE — Assessment & Plan Note (Addendum)
Negative EEG and MRI brain.  Most likely due to hypoglycemia.  Condition had improved. ?

## 2021-05-23 NOTE — Assessment & Plan Note (Addendum)
Continue aspirin, Plavix, Crestor, imdur, Ranexa ?

## 2021-05-23 NOTE — ED Notes (Addendum)
Lab notified pt. Needs a lab blood draw. States will send. ?

## 2021-05-23 NOTE — ED Notes (Signed)
Pt in MRI.

## 2021-05-23 NOTE — Assessment & Plan Note (Addendum)
Renal function has been stable.  Patient does not have any urinary tension ?

## 2021-05-23 NOTE — ED Notes (Signed)
Dr. Cinda Quest notified pt's BGL is 60, and that pt. Is asking for food/drink. will do swallow screen and allow pt. To eat per Dr. Cinda Quest. ?

## 2021-05-23 NOTE — ED Notes (Signed)
Pt to CT, and xray. ?

## 2021-05-23 NOTE — ED Notes (Signed)
Lab notified pt. Will need to be a lab blood draw. Charge RN Levada Dy notified, and included in secure chat. ?
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
## 2021-05-23 NOTE — ED Triage Notes (Signed)
C/o AMS this AM, with fall while transferring from wheelchair to recliner this AM. Medic reports orientation increasing and decreasing en route to ED. Pt. Reports diarrhea x3 days with weakness and dizziness.  ?

## 2021-05-23 NOTE — Assessment & Plan Note (Addendum)
TSH normal 2.213 today ?Resume home dose Synthroid. ?

## 2021-05-23 NOTE — ED Notes (Signed)
In and out cath per dr. Sonny Dandy order. ?

## 2021-05-23 NOTE — ED Provider Notes (Signed)
? ?[facility] ?Provider Note ? ? ? Event Date/Time  ? First MD Initiated Contact with Patient 05/23/21 [phone]   ?  (approximate) ? ? ?History  ? ?Altered Mental Status (C/o AMS this AM, with fall while transferring from wheelchair to recliner this AM. Medic reports orientation increasing and decreasing en route to ED. Pt. Reports diarrhea x3 days with weakness and dizziness. ) ? ? ?HPI ? ?[name] is a 51 y.o. female past medical history of sepsis and absence seizure's and also see past medical history that I copied below.  Apparently fell transferring from a wheelchair today.  Uncertain certain if she hit her head.  She is intermittently confused sometimes she knows it is 2023 and Barbette Or is the president other times she thinks it is 2004 does not know who the president is.  Currently she thinks is 2004 she does not know where she is now.  She is not sure if she hit her head when she fell she denies any neck pain or chest pain or bellyache.  She says her right leg is somewhat crampy.  She says it is a little weak from her prior stroke.  Otherwise she really cannot tell me much of anything. ? ?  ? Acute colitis 01/27/2017  ? Acute pyelonephritis    ? Acute upper GI bleed 01/25/2020  ? Anemia    ?  iron deficiency anemia  ? Aortic arch aneurysm ([place])    ? BRCA negative 2014  ? CAD (coronary artery disease)    ?  a. 08/2003 Cath: LAD 30-40-med Rx; b. 11/2014 PCI: LAD 674m(3.25x23 Xience Alpine DES); c. 06/2015 PCI: D1 (2.25x12 Resolute Integrity DES); d. 06/2017 PCI: Patent mLAD stent, D2 95 (PTCA); e. 09/2017 PCI: D2 99ost (CBA); d. 12/2017 Cath: LM nl, LAD 368m80d (small), D1 40ost, D2 95ost, LCX 40p, RCA 40ost/p->Med rx for D2 given restenosis.  ? Closed nondisplaced intertrochanteric fracture of left femur (HCTama9/10/2018  ? Colitis 06/03/2015  ? Colon polyp    ? [facility]cerebral vascular accident) ([facility]   ?  Left side weakness.   ? Degenerative tear of glenoid labrum of right shoulder  03/15/2017  ? Diabetes mellitus without complication ([facility]   ? Family history of breast cancer    ?  BRCA neg 2014  ? Femur fracture, left ([facility]9/10/2018  ? Gastric ulcer 04/27/2011  ? Helicobacter pylori infection 11/23/2014  ? History of echocardiogram    ?  a. 03/2017 Echo: EF 60-65%, no rwma; b. 02/2018 Echo: EF 60-65%, no rwma. Nl RV fxn. No cardiac source of emboli (admitted w/ stroke).  ? Hypertension    ? Iron deficiency anemia secondary to blood loss (chronic)    ? Malignant melanoma of skin of scalp (HCM[facility]   ? MI, acute, non ST segment elevation ([facility]   ? Neuromuscular disorder ([facility]CT[place]   ? S/P drug eluting coronary stent placement 06/04/2015  ? Sepsis (HCMason2/14/2019  ? Sepsis secondary to UTI (HCMalin3/22/2022  ? Stroke ([facility]   ?  a. 02/2018 MRI: 74m92mate acute/early subacute L medial frontal lobe inarct; b. 02/2018 MRA No large vessel occlusion or aneurysm. Mod to sev L P2 stenosis. thready L vertebral artery, diffusely dzs'd; c. 02/2018 Carotid U/S: <50% bilat ICA dzs.  ? Unspecified atrial fibrillation (HCCPiedra/22/2022  ?  ? ? ?Physical Exam  ? ?Triage Vital Signs: ?ED Triage Vitals  ?Enc Vitals Group  ?  BP 05/23/21 0728 (!) 188/78  ?   Pulse Rate 05/23/21 0728 60  ?   Resp 05/23/21 0728 13  ?   Temp 05/23/21 0728 98.5 ?F (36.9 ?C)  ?   Temp Source 05/23/21 0728 Oral  ?   SpO2 05/23/21 0727 100 %  ?   Weight 05/23/21 0730 165 lb 11.2 oz (75.2 kg)  ?   Height 05/23/21 0730 _0  (1.6 m)  ?   Head Circumference --   ?   Peak Flow --   ?   Pain Score 05/23/21 0729 0  ?   Pain Loc --   ?   Pain Edu? --   ?   Excl. in [place]? --   ? ? ?Most recent vital signs: ?Vitals:  ? 05/23/21 1445 05/23/21 1455  ?BP:  (!) 166/79  ?Pulse: 65   ?Resp: 16   ?Temp:    ?SpO2: 100%   ? ? ? ?General: Awake, alert but not completely oriented see HPI in no distress ?Head normocephalic atraumatic ?Eyes pupils equal round reactive extraocular movements intact ?Mouth no erythema or exudate ?Neck: No tenderness ?CV:  Good peripheral  perfusion.  Heart regular rate and rhythm no audible murmurs ?Resp:  Normal effort.  Lungs are clear ?Extremities: No edema no tenderness patient reports mild bilateral hip tenderness that is old. ? ? ?ED Results / Procedures / Treatments  ? ?Labs ?(all labs ordered are listed, but only abnormal results are displayed) ?Labs Reviewed  ?LACTIC ACID, PLASMA - Abnormal; Notable for the following components:  ?    Result Value  ? Lactic Acid, Venous 2.0 (*)   ? All other components within normal limits  ?LACTIC ACID, PLASMA - Abnormal; Notable for the following components:  ? Lactic Acid, Venous 2.1 (*)   ? All other components within normal limits  ?CBC WITH DIFFERENTIAL/PLATELET - Abnormal; Notable for the following components:  ? MCHC 29.8 (*)   ? Platelets 141 (*)   ? All other components within normal limits  ?URINALYSIS, COMPLETE (UACMP) WITH MICROSCOPIC - Abnormal; Notable for the following components:  ? Color, Urine STRAW (*)   ? APPearance CLEAR (*)   ? Protein, ur 100 (*)   ? Bacteria, UA RARE (*)   ? All other components within normal limits  ?URINE DRUG SCREEN, QUALITATIVE ([facility] ONLY) - Abnormal; Notable for the following components:  ? Benzodiazepine, Ur Scrn POSITIVE (*)   ? All other components within normal limits  ?COMPREHENSIVE METABOLIC PANEL - Abnormal; Notable for the following components:  ? Chloride 114 (*)   ? CO2 17 (*)   ? Glucose, Bld 60 (*)   ? Creatinine, Ser 1.06 (*)   ? Calcium 7.1 (*)   ? Total Protein 4.3 (*)   ? Albumin 2.2 (*)   ? AST 50 (*)   ? ALT 51 (*)   ? All other components within normal limits  ?CBG MONITORING, ED - Abnormal; Notable for the following components:  ? Glucose-Capillary 60 (*)   ? All other components within normal limits  ?CBG MONITORING, ED - Abnormal; Notable for the following components:  ? Glucose-Capillary 181 (*)   ? All other components within normal limits  ?CULTURE, BLOOD (ROUTINE X 2)  ?CULTURE, BLOOD (ROUTINE X 2)  ?URINE CULTURE  ?TSH  ?PROTIME-INR   ?APTT  ?LACTIC ACID, PLASMA  ?BRAIN NATRIURETIC PEPTIDE  ?LACTIC ACID, PLASMA  ?AMMONIA  ?POC URINE PREG, ED  ? ? ? ?EKG ? ?EKG  read interpreted by me shows normal sinus rhythm normal axis no acute ST-T changes ? ? ?RADIOLOGY ?X-ray read by radiology films reviewed by me show no acute disease ?CT of the head and neck read by radiology films reviewed by me show no acute disease ?MRI of the brain read by radiology reviewed by me show no acute disease ? ? ? ?PROCEDURES: ? ?Critical Care performed:  ? ?Procedures ? ? ?MEDICATIONS ORDERED IN ED: ?Medications  ?dextrose 50 % solution 50 mL (has no administration in time range)  ?hydrALAZINE (APRESOLINE) injection 5 mg (has no administration in time range)  ?ondansetron (ZOFRAN) injection 4 mg (has no administration in time range)  ?acetaminophen (TYLENOL) tablet 650 mg (has no administration in time range)  ?0.9 %  sodium chloride infusion ( Intravenous New Bag/Given 05/23/21 1325)  ?enoxaparin (LOVENOX) injection 40 mg (has no administration in time range)  ?LORazepam (ATIVAN) injection 1 mg (has no administration in time range)  ?aspirin EC tablet 81 mg (81 mg Oral Given 05/23/21 1455)  ?Rimegepant Sulfate TBDP 75 mg (has no administration in time range)  ?amLODipine (NORVASC) tablet 5 mg (5 mg Oral Given 05/23/21 1455)  ?isosorbide mononitrate (IMDUR) 24 hr tablet 15 mg (has no administration in time range)  ?nitroGLYCERIN (NITROSTAT) SL tablet 0.4 mg (has no administration in time range)  ?ranolazine (RANEXA) 12 hr tablet 500 mg (has no administration in time range)  ?rosuvastatin (CRESTOR) tablet 40 mg (has no administration in time range)  ?ALPRAZolam (XANAX) tablet 1 mg (has no administration in time range)  ?venlafaxine XR (EFFEXOR-XR) 24 hr capsule 75 mg (has no administration in time range)  ?mirtazapine (REMERON) tablet 7.5 mg (has no administration in time range)  ?levothyroxine (SYNTHROID) tablet 25 mcg (has no administration in time range)   ?diphenoxylate-atropine (LOMOTIL) 2.5-0.025 MG per tablet 1 tablet (has no administration in time range)  ?pantoprazole (PROTONIX) EC tablet 40 mg (has no administration in time range)  ?mirabegron ER (MYRBETRIQ) tablet 50 mg (has no

## 2021-05-23 NOTE — ED Notes (Signed)
MD notified pt's BP back up. Will continue to monitor. ?

## 2021-05-23 NOTE — Assessment & Plan Note (Addendum)
Patient has a borderline cortisol level, started on hydrocortisone 20 mg twice a day. ?Patient also had elevated insulin level at 165 and a C-peptide of 19.3.  Significant elevated. ?But since starting the hydrocortisone, glucose has been running high, did not develop any additional hypoglycemia.  At this point, patient be referred to endocrinologist, will try to call to set up appointment to be seen in 1 to 2 weeks with Dr. Hubbard Robinson. ?MRI of the abdomen did not show any mass in the liver or pancreas ? ?

## 2021-05-23 NOTE — Assessment & Plan Note (Addendum)
Diet-controlled diabetes.  Recent A1c 5.1, well controlled.  Patient currently does not take any diabetic medication. ?

## 2021-05-23 NOTE — ED Notes (Addendum)
Pt in room, playing on iphone. MD notified ?

## 2021-05-23 NOTE — Assessment & Plan Note (Addendum)
No significant source of infection.  Patient is not on metformin.  Most likely source is liver, patient most likely has nonalcoholic steatohepatitis.  ?

## 2021-05-23 NOTE — ED Notes (Addendum)
Date and time results received: 05/23/21 0835 ? ?Test: lactic acid ?Critical Value: 2.0 ? ?Name of Provider Notified: malinda at [phone] ? ?Orders Received? Or Actions Taken?: see orders ?

## 2021-05-24 DIAGNOSIS — E162 Hypoglycemia, unspecified: Secondary | ICD-10-CM | POA: Diagnosis not present

## 2021-05-24 LAB — GLUCOSE, CAPILLARY
Glucose-Capillary: 64 mg/dL — ABNORMAL LOW (ref 70–99)
Glucose-Capillary: 134 mg/dL — ABNORMAL HIGH (ref 70–99)
Glucose-Capillary: 75 mg/dL (ref 70–99)
Glucose-Capillary: 147 mg/dL — ABNORMAL HIGH (ref 70–99)
Glucose-Capillary: 64 mg/dL — ABNORMAL LOW (ref 70–99)
Glucose-Capillary: 74 mg/dL (ref 70–99)
Glucose-Capillary: 202 mg/dL — ABNORMAL HIGH (ref 70–99)
Glucose-Capillary: 99 mg/dL (ref 70–99)
Glucose-Capillary: 69 mg/dL — ABNORMAL LOW (ref 70–99)
Glucose-Capillary: 101 mg/dL — ABNORMAL HIGH (ref 70–99)
Glucose-Capillary: 139 mg/dL — ABNORMAL HIGH (ref 70–99)
Glucose-Capillary: 133 mg/dL — ABNORMAL HIGH (ref 70–99)

## 2021-05-24 LAB — BASIC METABOLIC PANEL
Anion gap: 5 (ref 5–15)
BUN: 21 mg/dL — ABNORMAL HIGH (ref 6–20)
CO2: 25 mmol/L (ref 22–32)
Calcium: 8.6 mg/dL — ABNORMAL LOW (ref 8.9–10.3)
Chloride: 113 mmol/L — ABNORMAL HIGH (ref 98–111)
Creatinine, Ser: 1.42 mg/dL — ABNORMAL HIGH (ref 0.44–1.00)
GFR, Estimated: 45 mL/min — ABNORMAL LOW (ref >60)
Glucose, Bld: 115 mg/dL — ABNORMAL HIGH (ref 70–99)
Potassium: 4.4 mmol/L (ref 3.5–5.1)
Sodium: 143 mmol/L (ref 135–145)

## 2021-05-24 LAB — URINE CULTURE: Culture: NO GROWTH

## 2021-05-24 LAB — LACTIC ACID, PLASMA: Lactic Acid, Venous: 2 mmol/L (ref 0.5–1.9)

## 2021-05-24 LAB — CORTISOL-AM, BLOOD: Cortisol - AM: 4.6 ug/dL — ABNORMAL LOW (ref 6.7–22.6)

## 2021-05-24 NOTE — Hospital Course (Addendum)
Meriam Marita [place] [name] is a 51 y.o. 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 ?Patient had a seizure disorder, EEG did not show any seizure activity.  She was found to have hypoglycemia.  Patient states that that she has recurrent hypoglycemia, she was taking glucose tablets as needed. ?Brain MRI without acute changes. ?Due to frequent hypoglycemia, her cortisol level was obtained, patient at least has relative adrenal insufficiency which may be the cause of hypoglycemia.  ?Patient also had significant elevation of C-peptide and insulin level, MRI of the liver and pancreas did not show any mass. ?Patient was given hydrocortisone at the 20 mg twice a day, glucose has been much better since then. ?At this point, patient will be followed by endocrinologist, Dr. Renato Shin in the next 1 to 2 weeks. ?

## 2021-05-24 NOTE — Discharge Summary (Signed)
?Physician Discharge Summary ?  ?Patient: [name] MRN: [id number] DOB: [birth year]  ?Admit date:     05/23/2021  ?Discharge date: 05/24/21  ?Discharge Physician: Sharen Hones  ? ?PCP: [name], MD  ? ?Recommendations at discharge:  ? ?Follow-up with PCP in 1 week. ?Please look at results of a.m. cortisol level, C-peptide and insulin level. ? ?Discharge Diagnoses: ?Principal Problem: ?  Acute metabolic encephalopathy ?Active Problems: ?  Depression with anxiety ?  Essential hypertension ?  Hyperlipidemia ?  Hypothyroidism ?  CAD (coronary artery disease) ?  Fall ?  Hypoglycemia ?  Type II diabetes mellitus with renal manifestations ([place]) ?  Chronic kidney disease, stage 3b ([place]) ?  Seizure ([place]) ?  Elevated lactic acid level ?  Chronic diastolic CHF (congestive heart failure) ([place]) ? ?Resolved Problems: ?  * No resolved hospital problems. * ? ?Hospital Course: ?[name] is a 51 y.o. 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 ?Patient had a seizure disorder, EEG did not show any seizure activity.  She was found to have hypoglycemia.  Patient states that that she has recurrent hypoglycemia, she was taking glucose tablets as needed. ?Brain MRI without acute changes. ?At this point, patient does not have any symptoms, she is medically stable to be discharged.  She is advised to take a snack before her sleep.   ? ?Assessment and Plan: ?* Acute metabolic encephalopathy ?Negative EEG and MRI brain.  Most likely due to hypoglycemia. ? ?Chronic diastolic CHF (congestive heart failure) ([place]) ?Stable. ? ?Elevated lactic acid level ?No significant source of infection.  Patient is not on  metformin.  Most likely source is liver, patient most likely has nonalcoholic steatohepatitis.  ? ?Seizure ([place]) ?EEG did not show any seizure activity ? ?Chronic kidney disease, stage 3b ([place]) ?Renal function has been stable.  Patient does not have any urinary tension ? ?Type II diabetes mellitus with renal manifestations ([place]) ?Diet-controlled diabetes.  Recent A1c 5.1, well controlled.  Patient currently does not take any diabetic medication. ? ?Hypoglycemia ?Patient altered mental status probably is due to hypoglycemia.  Patient seem to have hypoglycemia in the early morning.  Patient has been taking as needed glucose tablets.  I discussed with patient son and patient herself, she is advised to take a carbohydrate-based snack before sleep. ?I also checked a.m. cortisol level and C-peptide and insulin level.  I called the lab, results will be available Monday.  Patient need to follow-up with PCP for this results. ? ?Fall ?Patient has been having for at home.  We will send PT as outpatient ? ?CAD (coronary artery disease) ?Continue aspirin, Plavix, Crestor, imdur, Ranexa ? ?Hypothyroidism ?TSH normal 2.213 today ?Resume home dose Synthroid. ? ?Hyperlipidemia ?- Crestor ? ?Essential hypertension ?Resume home regimen. ? ?Depression with anxiety ?Continue home medications ? ? ? ? ?  ? ? ?Consultants: None ?Procedures performed: None  ?Disposition: Home ?Diet recommendation:  ?Discharge Diet Orders (From admission, onward)  ? ?  Start     Ordered  ? 05/24/21 0000  Diet - low sodium heart healthy       ? 05/24/21 1014  ? ?  ?  ? ?  ? ?Cardiac diet ?DISCHARGE MEDICATION: ?Allergies as  of 05/24/2021   ? ?   Reactions  ? Desyrel [trazodone] Other (See Comments)  ? Pt states this medication brings on seizures   ? Lipitor [atorvastatin] Other (See Comments)  ? Leg pains  ? ?  ? ?  ?Medication List  ?  ? ?TAKE these medications   ? ?Aimovig 140 MG/ML Soaj ?Generic drug: Erenumab-aooe ?Inject 140 mg into the skin every 28  (twenty-eight) days. ?  ?ALPRAZolam 1 MG tablet ?Commonly known as: Duanne Moron ?Take 1 tablet by mouth twice daily ?  ?amLODipine 10 MG tablet ?Commonly known as: NORVASC ?Take 0.5 tablets (5 mg total) by mouth daily. ?  ?aspirin 81 MG EC tablet ?Take 1 tablet (81 mg total) by mouth daily. Swallow whole. ?  ?Baclofen 5 MG Tabs ?Take 5 mg by mouth 2 (two) times daily as needed (muscle spasms). ?  ?clopidogrel 75 MG tablet ?Commonly known as: PLAVIX ?Take 1 tablet (75 mg total) by mouth daily. ?  ?desvenlafaxine 50 MG 24 hr tablet ?Commonly known as: PRISTIQ ?Take 50 mg by mouth daily. ?  ?diclofenac Sodium 1 % Gel ?Commonly known as: VOLTAREN ?Apply 2 g topically 4 (four) times daily as needed (joint paim). ?  ?diphenoxylate-atropine 2.5-0.025 MG tablet ?Commonly known as: Lomotil ?Take 1 tablet by mouth 4 (four) times daily as needed for diarrhea or loose stools. ?  ?estradiol 0.1 MG/GM vaginal cream ?Commonly known as: ESTRACE ?Place 1 Applicatorful vaginally 3 (three) times a week. Estrogen Cream Instruction Discard applicator Apply pea sized amount to tip of finger to urethra before bed. Wash hands well after application. Use Monday, Wednesday and Friday ?  ?gabapentin 300 MG capsule ?Commonly known as: NEURONTIN ?Take 2 capsules (600 mg total) by mouth 2 (two) times daily. ?  ?isosorbide mononitrate 30 MG 24 hr tablet ?Commonly known as: IMDUR ?Take 0.5 tablets (15 mg total) by mouth daily. ?  ?lacosamide 200 MG Tabs tablet ?Commonly known as: VIMPAT ?Take 1 tablet (200 mg total) by mouth 2 (two) times daily. ?  ?levETIRAcetam 1000 MG tablet ?Commonly known as: KEPPRA ?Take 1 tablet (1,000 mg total) by mouth 2 (two) times daily. ?  ?levothyroxine 25 MCG tablet ?Commonly known as: SYNTHROID ?Take 1 tablet by mouth 30 minutes before breakfast ?What changed: See the new instructions. ?  ?metoprolol tartrate 25 MG tablet ?Commonly known as: LOPRESSOR ?Take 1 tablet (25 mg total) by mouth 2 (two) times daily. ?   ?mirabegron ER 50 MG Tb24 tablet ?Commonly known as: MYRBETRIQ ?Take 1 tablet (50 mg total) by mouth daily. ?  ?mirtazapine 7.5 MG tablet ?Commonly known as: REMERON ?Take 1 tablet (7.5 mg total) by mouth at bedtime. ?  ?nitroGLYCERIN 0.4 MG SL tablet ?Commonly known as: NITROSTAT ?Dissolve 1 tablet under tongue every 98mns, up to 3 doses, for chest pain. Max 3 doses in 368ms. ?What changed: See the new instructions. ?  ?Nurtec 75 MG Tbdp ?Generic drug: Rimegepant Sulfate ?Take 75 mg by mouth as needed. ?What changed: reasons to take this ?  ?ofloxacin 0.3 % ophthalmic solution ?Commonly known as: OCUFLOX ?Place 1 drop into the right eye 3 (three) times daily. ?  ?pantoprazole 40 MG tablet ?Commonly known as: PROTONIX ?Take 1 tablet by mouth twice daily ?  ?prednisoLONE acetate 1 % ophthalmic suspension ?Commonly known as: PRED FORTE ?Place 1 drop into the right eye 3 (three) times daily. ?  ?promethazine 25 MG tablet ?Commonly known as: PHENERGAN ?Take 1 tablet by mouth every 6 hours as  needed for nausea or vomiting. ?  ?ranolazine 500 MG 12 hr tablet ?Commonly known as: Ranexa ?Take 1 tablet (500 mg total) by mouth 2 (two) times daily. ?  ?rosuvastatin 40 MG tablet ?Commonly known as: CRESTOR ?Take 1 tablet (40 mg total) by mouth daily at 6 PM. ?  ?SYSTANE OP ?Place 1 drop into both eyes daily as needed (dry eyes). ?  ? ?  ? ? Follow-up Information   ? ? [name], MD Follow up in 1 week(s).   ?Specialty: Family Medicine ?Contact information: ?[address] ?[address] ?[place] [address] ?[phone] ? ? ?  ?  ? ? Minna Merritts, MD .   ?Specialty: Cardiology ?Contact information: ?[address] ?[place] [address] ?[phone] ? ? ?  ?  ? ?  ?  ? ?  ? ?Discharge Exam: ?Filed Weights  ? 05/23/21 0730  ?Weight: 75.2 kg  ? ?General exam: Appears calm and comfortable  ?Respiratory system: Clear to auscultation. Respiratory effort normal. ?Cardiovascular system: S1 & S2 heard, RRR.  No JVD, murmurs, rubs, gallops or clicks. No pedal edema. ?Gastrointestinal system: Abdomen is nondistended, soft and nontender. No organomegaly or masses felt. Normal bowel sounds heard. ?Central nervous system: Al

## 2021-05-24 NOTE — Evaluation (Addendum)
Occupational Therapy Evaluation ?Patient Details ?Name: [name] ?MRN: [id number] ?DOB: 19-Jan-1971 ?Today's Date: 05/24/2021 ? ? ?History of Present Illness Pt. is a 51 y.o. female who was admitted to [facility] with Acute Metabolic Encephalopathy following a fall. Pt. 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  ? ?Clinical Impression ?  ?Pt. Presents with right sided weakness, limited activity tolerance, and limited functional mobility which limits her ability to safely complete basic ADL and IADL functioning. Pt. Resides at home with with her son who works long shifts at a time as a Airline pilot. Pt. required assist with morning care, and shower tranfers from her son.  Pt. Required assist with meal preparation, and home management tasks. Pt. Reheats meals up in the microwave that her son prepares ahead of time. Pt. Mostly uses a w/c within the home. Pt. Walked with a walker when assisted. Pt. Was receiving home health OT, PT, Nursing, and has an aide. Pt. Has a history of falls.  Pt. Requires min-modA for LE dressing while sitting at the EOB, and for clothing negotiation, minA transfers with the RW. Pt. will benefit from OT services for ADL training, A/E training, and pt. Education about home modification, safety awareness, and DME. Pt. Plans to return home upon discharge with family to assist pt. as needed. Pt. will benefit from continued follow-up HHOT services upon discharge.   ?   ? ?Recommendations for follow up therapy are one component of a multi-disciplinary discharge planning process, led by the attending physician.  Recommendations may be updated based on patient status, additional functional criteria and insurance authorization.  ? ?Follow Up Recommendations ? Home health OT  ?  ?Assistance Recommended at Discharge    ?Patient can return home with the following A  little help with walking and/or transfers;Assistance with cooking/housework;A little help with bathing/dressing/bathroom;Help with stairs or ramp for entrance;Direct supervision/assist for medications management;Assist for transportation ? ?  ?Functional Status Assessment ?    ?Equipment Recommendations ? BSC/3in1  ?  ?Recommendations for Other Services   ? ? ?  ?Precautions / Restrictions Precautions ?Precautions: Fall ?Restrictions ?Weight Bearing Restrictions: No  ? ?  ? ?Mobility Bed Mobility ?Overal bed mobility: Needs Assistance ?Bed Mobility: Supine to Sit ?  ?  ?Supine to sit: Min assist ?  ?  ?  ?  ? ?Transfers ?Overall transfer level: Needs assistance ?Equipment used: Rolling walker (2 wheels) ?Transfers: Sit to/from Stand ?Sit to Stand: Min assist ?  ?  ?  ?  ?  ?  ?  ? ?  ?Balance   ?  ?  ?  ?  ?  ?  ?  ?  ?  ?  ?  ?  ?  ?  ?  ?  ?  ?  ?   ? ?ADL either performed or assessed with clinical judgement  ? ?ADL Overall ADL's : Needs assistance/impaired ?  ?  ?  ?  ?  ?  ?Lower Body Bathing: Moderate assistance ?  ?  ?  ?Lower Body Dressing: Minimal assistance;Moderate assistance ?  ?  ?  ?  ?  ?  ?  ?Functional mobility during ADLs: Minimal assistance;Rolling walker (2 wheels) ?   ? ? ? ?Vision Patient Visual Report: No change from baseline ?   ?   ?Perception   ?  ?Praxis   ?  ? ?Pertinent Vitals/Pain Pain  Assessment ?Pain Assessment: 0-10  ? ? ? ?Hand Dominance Right ?  ?Extremity/Trunk Assessment Upper Extremity Assessment ?Upper Extremity Assessment: Generalized weakness (Hx of right sided weakness from CVA) ?  ?  ?  ?  ?  ?Communication Communication ?Communication: No difficulties ?  ?Cognition Arousal/Alertness: Awake/alert ?Behavior During Therapy: [facility] for tasks assessed/performed ?Overall Cognitive Status: Within Functional Limits for tasks assessed ?  ?  ?  ?  ?  ?  ?  ?  ?  ?  ?  ?  ?  ?  ?  ?  ?  ?  ?  ?General Comments    ? ?  ?Exercises   ?  ?Shoulder Instructions    ? ? ?Home Living  Family/patient expects to be discharged to:: Private residence ?Living Arrangements: Children (Son works as a IT trainer, and works long shifts.) ?Available Help at Discharge: Family;Friend(s) ?Type of Home: House ?Home Access: Ramped entrance ?  ?  ?Home Layout: One level ?  ?  ?Bathroom Shower/Tub: Tub/shower unit ?  ?Bathroom Toilet: Standard ?  ?  ?Home Equipment: Rollator (4 wheels);Cane - single point;Shower seat;Wheelchair - manual;BSC/3in1 ?  ?Additional Comments: Aide 2x/wk, Home health, nurse, receiving home health OT, and PT services. ?  ? ?  ?Prior Functioning/Environment Prior Level of Function : Needs assist ?  ?  ?  ?  ?  ?  ?Mobility Comments: Mostly uses a w/c to manuever around the home unassisted. uses a walker when assisted. ?ADLs Comments: Assist with morning ADL care.Son assist pt. with shower t/fs. Pt.'s son prepares meals ahead of time, and pt. heats them up in the microwave. Pt. reports ordering medications set-up for a daily pillbox, ?  ? ?  ?  ?OT Problem List: Decreased strength;Decreased activity tolerance;Decreased knowledge of use of DME or AE;Impaired UE functional use;Impaired balance (sitting and/or standing) ?  ?   ?OT Treatment/Interventions: Self-care/ADL training;Patient/family education;Neuromuscular education;Therapeutic exercise;Therapeutic activities;DME and/or AE instruction  ?  ?OT Goals(Current goals can be found in the care plan section) Acute Rehab OT Goals ?Patient Stated Goal: To return home ?OT Goal Formulation: With patient ?Time For Goal Achievement: 06/07/21 ?Potential to Achieve Goals: Good ?ADL Goals ?Pt Will Perform Grooming: with min guard assist ?Pt Will Perform Lower Body Dressing: with min guard assist ?Pt Will Transfer to Toilet: with min guard assist ?Pt Will Perform Toileting - Clothing Manipulation and hygiene: with min guard assist  ?OT Frequency: Min 2X/week ?  ? ?Co-evaluation   ?  ?  ?  ?  ? ?  ?AM-PAC OT "6 Clicks" Daily Activity     ?Outcome  Measure Help from another person eating meals?: A Little ?Help from another person taking care of personal grooming?: A Little ?Help from another person toileting, which includes using toliet, bedpan, or urinal?: A Little ?Help from another person bathing (including washing, rinsing, drying)?: A Lot ?Help from another person to put on and taking off regular upper body clothing?: A Little ?Help from another person to put on and taking off regular lower body clothing?: A Lot ?6 Click Score: 16 ?  ?End of Session Equipment Utilized During Treatment: Gait belt ? ?Activity Tolerance: Patient tolerated treatment well ?Patient left: in bed ? ?OT Visit Diagnosis: Muscle weakness (generalized) (M62.81)  ?              ?Time: 4098-1191 ?OT Time Calculation (min): 37 min ?Charges:  OT General Charges ?$OT Visit: 1 Visit ?OT Evaluation ?$OT Eval Moderate Complexity:  1 Mod ? ?Harrel Carina, MS, OTR/L  ? ?Harrel Carina ?05/24/2021, 12:39 PM ?

## 2021-05-24 NOTE — Evaluation (Signed)
Physical Therapy Evaluation ?Patient Details ?Name: [facility] ?MRN: [id number] ?DOB: 1970/04/03 ?Today's Date: 05/24/2021 ? ?History of Present Illness ? Pt is a 51 y/o F admitted on 05/23/21 after presenting with c/o fall & AMS. Pt is being treated for acute metabolic encephalopathy. PMH: HTN, HLD, DM, stroke, GERD, hypothyroidism, depression, anxiety, CAD, s/p DES placement, gastric ulcer disease, colitis, anemia, aortic arch aneurysm, dCHF, PSVT, CKD-3, s/p gastric bypass, s/p bypass to L carotid to subclavian artery  ?Clinical Impression ? Pt seen for PT evaluation with son present in room. Nurse requested pt eat lunch prior to PT session 2/2 low blood glucose earlier; pt received finishing lunch. Pt reports prior to admission she was receiving HHPT & OT services & only ambulated with therapy or her son, otherwise she was mod I from w/c level while her son worked long shifts as a Airline pilot. On this date, pt does very well with mobility. Pt performs STS with supervision & ambulates 1 lap around nurses station with RW & supervision, reporting it feels good to do so. Pt demonstrates BLE foot drop & reports this is chronic 2/2 BLE neuropathy. Spent entirety of session discussing PLOF with pt reporting extensive fall hx & motivation to not use a w/c for mobility. PT educated pt on recommendation of OPPT to focus on more high level balance, gait with LRAD, and for potential fitting for BLE AFOs to help reduce fall risk. Educated pt's son on OPPT recommendation as well. Will continue to follow pt acutely to address endurance, balance, and gait. ? ?   ? ?Recommendations for follow up therapy are one component of a multi-disciplinary discharge planning process, led by the attending physician.  Recommendations may be updated based on patient status, additional functional criteria and insurance authorization. ? ?Follow Up Recommendations Outpatient PT ? ?  ?Assistance Recommended at Discharge Intermittent  Supervision/Assistance  ?Patient can return home with the following ? Assistance with cooking/housework;A little help with walking and/or transfers;A little help with bathing/dressing/bathroom;Assist for transportation;Help with stairs or ramp for entrance;Direct supervision/assist for medications management ? ?  ?Equipment Recommendations None recommended by PT (pt may benefit from BLE AFO's from OPPT)  ?Recommendations for Other Services ?    ?  ?Functional Status Assessment Patient has had a recent decline in their functional status and demonstrates the ability to make significant improvements in function in a reasonable and predictable amount of time.  ? ?  ?Precautions / Restrictions Precautions ?Precautions: Fall ?Restrictions ?Weight Bearing Restrictions: No  ? ?  ? ?Mobility ? Bed Mobility ?  ?  ?  ?  ?  ?  ?  ?General bed mobility comments: not observed, pt received in recliner & left sitting on EOB ?  ? ?Transfers ?Overall transfer level: Needs assistance ?Equipment used: Rolling walker (2 wheels) ?Transfers: Sit to/from Stand ?Sit to Stand: Supervision ?  ?  ?  ?  ?  ?  ?  ? ?Ambulation/Gait ?Ambulation/Gait assistance: Supervision ?Gait Distance (Feet): 200 Feet ?Assistive device: Rolling walker (2 wheels) ?Gait Pattern/deviations: Decreased step length - right, Decreased step length - left, Decreased dorsiflexion - right, Decreased dorsiflexion - left, Step-through pattern ?Gait velocity: decreased ?  ?  ?General Gait Details: decreased heel strike BLE; educated pt on need to alternate between looking at feet & looking outward at surroundings; pt reports it feels good to get up & mobilize ? ?Stairs ?  ?  ?  ?  ?  ? ?Wheelchair Mobility ?  ? ?Modified  Rankin (Stroke Patients Only) ?  ? ?  ? ?Balance Overall balance assessment: Needs assistance, History of Falls ?Sitting-balance support: Feet supported, Bilateral upper extremity supported ?Sitting balance-Leahy Scale: Good ?  ?  ?Standing balance support:  Bilateral upper extremity supported, During functional activity ?Standing balance-Leahy Scale: Fair ?  ?  ?  ?  ?  ?  ?  ?  ?  ?  ?  ?  ?   ? ? ? ?Pertinent Vitals/Pain Pain Assessment ?Pain Assessment: Faces ?Faces Pain Scale: Hurts a little bit ?Pain Location: buttocks from sitting in recliner for a long time ?Pain Descriptors / Indicators: Discomfort ?Pain Intervention(s): Repositioned  ? ? ?Home Living Family/patient expects to be discharged to:: Private residence ?Living Arrangements: Children ?Available Help at Discharge: Family;Available PRN/intermittently (son works long shifts as a Airline pilot) ?Type of Home: House ?Home Access: Ramped entrance ?  ?  ?  ?Home Layout: One level ?Home Equipment: Rollator (4 wheels);Cane - single point;Shower seat;Wheelchair - manual;BSC/3in1 ?Additional Comments: Aide 2x/wk, Home health, nurse, receiving home health OT, and PT services.  ?  ?Prior Function Prior Level of Function : Needs assist;History of Falls (last six months) ?  ?  ?  ?  ?  ?  ?Mobility Comments: Pt uses w/c when home alone & can prepare meals & use BSC. When son is home he assists her with gait with RW. Pt reports significant hx of falling, stating she's had "too many to count" & endorses 4 falls in 1 day (reports she tries to perform tasks she knows she should not do, such as bending over to retrieve object from floor vs using reacher). ?ADLs Comments: Assist with morning ADL care.Son assist pt. with shower t/fs. Pt.'s son prepares meals ahead of time, and pt. heats them up in the microwave. Pt. reports ordering medications set-up for a daily pillbox, ?  ? ? ?Hand Dominance  ? Dominant Hand: Right ? ?  ?Extremity/Trunk Assessment  ? Upper Extremity Assessment ?Upper Extremity Assessment: Generalized weakness ?  ? ?Lower Extremity Assessment ?Lower Extremity Assessment: Generalized weakness;RLE deficits/detail;LLE deficits/detail ?RLE Deficits / Details: 1/5 ankle dorsiflexion (pt reports hx of  neuropathy) ?LLE Deficits / Details: 1/5 ankle dorsiflexion (pt reports hx of neuropathy) ?  ? ?   ?Communication  ? Communication: No difficulties  ?Cognition Arousal/Alertness: Awake/alert ?Behavior During Therapy: Flat affect ?Overall Cognitive Status: Within Functional Limits for tasks assessed ?  ?  ?  ?  ?  ?  ?  ?  ?  ?  ?  ?  ?  ?  ?  ?  ?General Comments: motivated to get better ?  ?  ? ?  ?General Comments   ? ?  ?Exercises    ? ?Assessment/Plan  ?  ?PT Assessment Patient needs continued PT services  ?PT Problem List Decreased strength;Decreased mobility;Decreased safety awareness;Decreased range of motion;Decreased activity tolerance;Decreased balance;Decreased knowledge of use of DME ? ?   ?  ?PT Treatment Interventions DME instruction;Therapeutic activities;Modalities;Gait training;Therapeutic exercise;Patient/family education;Stair training;Balance training;Functional mobility training;Neuromuscular re-education;Manual techniques;Wheelchair mobility training   ? ?PT Goals (Current goals can be found in the Care Plan section)  ?Acute Rehab PT Goals ?Patient Stated Goal: to not use a w/c anymore ?PT Goal Formulation: With patient ?Time For Goal Achievement: 06/07/21 ?Potential to Achieve Goals: Good ? ?  ?Frequency Min 2X/week ?  ? ? ?Co-evaluation   ?  ?  ?  ?  ? ? ?  ?AM-PAC PT "6 Clicks" Mobility  ?  Outcome Measure Help needed turning from your back to your side while in a flat bed without using bedrails?: None ?Help needed moving from lying on your back to sitting on the side of a flat bed without using bedrails?: None ?Help needed moving to and from a bed to a chair (including a wheelchair)?: A Little ?Help needed standing up from a chair using your arms (e.g., wheelchair or bedside chair)?: A Little ?Help needed to walk in hospital room?: A Little ?Help needed climbing 3-5 steps with a railing? : A Little ?6 Click Score: 20 ? ?  ?End of Session   ?Activity Tolerance: Patient tolerated treatment  well ?Patient left: in bed;with call bell/phone within reach;with family/visitor present ?  ?PT Visit Diagnosis: Unsteadiness on feet (R26.81);Muscle weakness (generalized) (M62.81);Difficulty in walking, not elsewhere c

## 2021-05-24 NOTE — Progress Notes (Signed)
Patient developed a more hypoglycemia, will cancel discharge until glucose more stable ?

## 2021-05-25 ENCOUNTER — Other Ambulatory Visit: Payer: Self-pay | Admitting: Medical

## 2021-05-25 ENCOUNTER — Other Ambulatory Visit: Payer: Self-pay | Admitting: Cardiovascular Disease

## 2021-05-25 ENCOUNTER — Observation Stay: Payer: Medicare Other

## 2021-05-25 ENCOUNTER — Other Ambulatory Visit: Payer: Self-pay | Admitting: Family Medicine

## 2021-05-25 ENCOUNTER — Encounter: Payer: Self-pay | Admitting: Family Medicine

## 2021-05-25 DIAGNOSIS — E162 Hypoglycemia, unspecified: Secondary | ICD-10-CM | POA: Diagnosis not present

## 2021-05-25 DIAGNOSIS — E2740 Unspecified adrenocortical insufficiency: Secondary | ICD-10-CM | POA: Diagnosis not present

## 2021-05-25 DIAGNOSIS — G629 Polyneuropathy, unspecified: Secondary | ICD-10-CM

## 2021-05-25 DIAGNOSIS — E039 Hypothyroidism, unspecified: Secondary | ICD-10-CM

## 2021-05-25 LAB — BASIC METABOLIC PANEL
Anion gap: 4 — ABNORMAL LOW (ref 5–15)
BUN: 26 mg/dL — ABNORMAL HIGH (ref 6–20)
CO2: 26 mmol/L (ref 22–32)
Calcium: 8.7 mg/dL — ABNORMAL LOW (ref 8.9–10.3)
Chloride: 112 mmol/L — ABNORMAL HIGH (ref 98–111)
Creatinine, Ser: 1.44 mg/dL — ABNORMAL HIGH (ref 0.44–1.00)
GFR, Estimated: 44 mL/min — ABNORMAL LOW (ref >60)
Glucose, Bld: 86 mg/dL (ref 70–99)
Potassium: 4.7 mmol/L (ref 3.5–5.1)
Sodium: 142 mmol/L (ref 135–145)

## 2021-05-25 LAB — GLUCOSE, CAPILLARY
Glucose-Capillary: 82 mg/dL (ref 70–99)
Glucose-Capillary: 119 mg/dL — ABNORMAL HIGH (ref 70–99)
Glucose-Capillary: 83 mg/dL (ref 70–99)
Glucose-Capillary: 158 mg/dL — ABNORMAL HIGH (ref 70–99)
Glucose-Capillary: 108 mg/dL — ABNORMAL HIGH (ref 70–99)
Glucose-Capillary: 68 mg/dL — ABNORMAL LOW (ref 70–99)
Glucose-Capillary: 115 mg/dL — ABNORMAL HIGH (ref 70–99)
Glucose-Capillary: 197 mg/dL — ABNORMAL HIGH (ref 70–99)
Glucose-Capillary: 211 mg/dL — ABNORMAL HIGH (ref 70–99)
Glucose-Capillary: 294 mg/dL — ABNORMAL HIGH (ref 70–99)
Glucose-Capillary: 335 mg/dL — ABNORMAL HIGH (ref 70–99)

## 2021-05-25 LAB — CBC
HCT: 34.8 % — ABNORMAL LOW (ref 36.0–46.0)
Hemoglobin: 10.7 g/dL — ABNORMAL LOW (ref 12.0–15.0)
MCH: 26.3 pg (ref 26.0–34.0)
MCHC: 30.7 g/dL (ref 30.0–36.0)
MCV: 85.5 fL (ref 80.0–100.0)
Platelets: 131 10*3/uL — ABNORMAL LOW (ref 150–400)
RBC: 4.07 MIL/uL (ref 3.87–5.11)
RDW: 13.6 % (ref 11.5–15.5)
WBC: 7 10*3/uL (ref 4.0–10.5)
nRBC: 0 % (ref 0.0–0.2)

## 2021-05-25 LAB — INSULIN AND C-PEPTIDE, SERUM
C-Peptide: 19.3 ng/mL — ABNORMAL HIGH (ref 1.1–4.4)
Insulin: 165 u[IU]/mL — ABNORMAL HIGH (ref 2.6–24.9)

## 2021-05-25 LAB — MAGNESIUM: Magnesium: 1.8 mg/dL (ref 1.7–2.4)

## 2021-05-25 MED ORDER — HYDROCORTISONE 20 MG PO TABS: 20.0000 mg | ORAL_TABLET | Freq: Two times a day (BID) | ORAL | 0 refills | Status: AC

## 2021-05-25 MED ORDER — HYDROCORTISONE 5 MG PO TABS: 5.0000 mg | ORAL_TABLET | Freq: Two times a day (BID) | ORAL | 0 refills | Status: DC

## 2021-05-25 MED ORDER — GADOBUTROL 1 MMOL/ML IV SOLN
7.5000 mL | Freq: Once | INTRAVENOUS | Status: AC | PRN
  Administered 2021-05-25: 7.5 mL via INTRAVENOUS

## 2021-05-25 MED ORDER — HYDROCORTISONE 10 MG PO TABS
20.0000 mg | ORAL_TABLET | Freq: Two times a day (BID) | ORAL | Status: DC
  Administered 2021-05-25 – 2021-05-26 (×3): 20 mg via ORAL
  Filled 2021-05-25 (×3): qty 2

## 2021-05-25 MED ORDER — HYDROCORTISONE 5 MG PO TABS
5.0000 mg | ORAL_TABLET | Freq: Every day | ORAL | Status: DC
Start: 2021-05-25 — End: 2021-05-25

## 2021-05-25 MED ORDER — HYDROCORTISONE 5 MG PO TABS
5.0000 mg | ORAL_TABLET | Freq: Two times a day (BID) | ORAL | Status: DC
  Administered 2021-05-25: 5 mg via ORAL
  Filled 2021-05-25: qty 1

## 2021-05-25 MED ORDER — HYDROCORTISONE 5 MG PO TABS
5.0000 mg | ORAL_TABLET | Freq: Two times a day (BID) | ORAL | Status: DC
  Filled 2021-05-25: qty 1

## 2021-05-25 MED ORDER — OXYCODONE-ACETAMINOPHEN 5-325 MG PO TABS
1.0000 | ORAL_TABLET | Freq: Once | ORAL | Status: AC
  Administered 2021-05-25: 1 via ORAL
  Filled 2021-05-25: qty 1

## 2021-05-25 NOTE — Progress Notes (Signed)
Hypoglycemic Event ? ?CBG: 68 ? ?Treatment: 4oz OJ. She is eating lunch. ? ?Symptoms: None ? ?Follow-up CBG: Time:1252 CBG Result:115 ? ?Possible Reasons for Event: Per MD adrenal insufficiency- low cortisol level.  ? ?Comments/MD notified: ?Dr Roosevelt Locks notified of BS 68.  Per MD to continue accu checks every 2hrs. MD will increese dose of Cortef to '20mg'$  BID. If BS remains stable may discharge patient at 1700 today.  ? ? ? ?[name] ? ? ?

## 2021-05-25 NOTE — Progress Notes (Signed)
Patient still has significant hypoglycemia today after giving steroids, insulin level was 165, C-peptide 19.3, all significant elevated.  Patient most likely has an insulinoma.  Will obtain abdominal MRI with contrast to identify the possible tumor, obtain general surgery consult.  Potentially transfer to tertiary hospital if needed. ?

## 2021-05-25 NOTE — Assessment & Plan Note (Addendum)
Patient had cortisol level drawn at 9 AM yesterday, was 4.6.  With the significant hypoglycemia, this level of cortisol is low.  We will continue hydrocortisone at 20 mg twice a day for significant hypoglycemia ?

## 2021-05-25 NOTE — Discharge Summary (Addendum)
?Physician Discharge Summary ?  ?Patient: [name] MRN: [id number] DOB: 1970-04-23  ?Admit date:     05/23/2021  ?Discharge date: 05/25/21  ?Discharge Physician: Sharen Hones  ? ?PCP: [name], MD  ? ?Recommendations at discharge:  ? ?Follow-up with PCP as outpatient. ? ?Discharge Diagnoses: ?Principal Problem: ?  Acute metabolic encephalopathy ?Active Problems: ?  Depression with anxiety ?  Essential hypertension ?  Hyperlipidemia ?  Hypothyroidism ?  CAD (coronary artery disease) ?  Fall ?  Hypoglycemia ?  Type II diabetes mellitus with renal manifestations ([place]) ?  Chronic kidney disease, stage 3b ([place]) ?  Seizure ([place]) ?  Elevated lactic acid level ?  Chronic diastolic CHF (congestive heart failure) ([place]) ?  Adrenal insufficiency ([place]) ? ?Resolved Problems: ?  * No resolved hospital problems. * ? ?Hospital Course: ?[name] is a 51 y.o. 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 ?Patient had a seizure disorder, EEG did not show any seizure activity.  She was found to have hypoglycemia.  Patient states that that she has recurrent hypoglycemia, she was taking glucose tablets as needed. ?Brain MRI without acute changes. ?Due to frequent hypoglycemia, her cortisol level was obtained, patient at least has relative adrenal insufficiency which may be the cause of hypoglycemia.  He will be treated with hydrocortisone 5 mg twice a day.  At this point, her condition is more stable, she is medically stable to be discharged ? ? ?Assessment and Plan: ?* Acute metabolic encephalopathy ?Negative EEG and MRI brain.  Most likely due to hypoglycemia. ? ?Adrenal insufficiency ([place]) ?Patient  had cortisol level drawn at 9 AM yesterday, was 4.6.  With the significant hypoglycemia, this level of cortisol is low.  We will start to treat with hydrocortisone 5 mg twice a day.  Follow-up with PCP as outpatient. ? ?Chronic diastolic CHF (congestive heart failure) ([place]) ?Stable. ? ?Elevated lactic acid level ?No significant source of infection.  Patient is not on metformin.  Most likely source is liver, patient most likely has nonalcoholic steatohepatitis.  ? ?Seizure ([place]) ?EEG did not show any seizure activity ? ?Chronic kidney disease, stage 3b ([place]) ?Renal function has been stable.  Patient does not have any urinary tension ? ?Type II diabetes mellitus with renal manifestations ([place]) ?Diet-controlled diabetes.  Recent A1c 5.1, well controlled.  Patient currently does not take any diabetic medication. ? ?Hypoglycemia ?Patient altered mental status probably is due to hypoglycemia.  Patient seem to have hypoglycemia in the early morning.  Patient has been taking as needed glucose tablets.  I discussed with patient son and patient herself, she is advised to take a carbohydrate-based snack before sleep. ?Patient had more frequent hypoglycemia episode yesterday, was not able to discharge.  Cortisol level was relatively low, most likely she has adrenal insufficiency.  Hydrocortisone was started at 5 mg twice a day.  Patient will need to follow-up with PCP as outpatient to adjust dose. ?Also pending for C-peptide and insulin level, should be followed by PCP. ? ?Fall ?Patient has been having for at home.  We will send PT as outpatient ? ?CAD (coronary artery disease) ?Continue aspirin, Plavix, Crestor, imdur, Ranexa ? ?Hypothyroidism ?TSH normal 2.213 today ?Resume home dose Synthroid. ? ?  Hyperlipidemia ?- Crestor ? ?Essential hypertension ?Resume home regimen. ? ?Depression with anxiety ?Continue home medications ? ? ?Addendum: Glucose still dropped down to 68 after given 5 mg of hydrocortisone.  We will increase it  to 20 mg twice a day and keep patient for additional 4 hours before discharge. ? ?  ? ? ?Consultants: None ?Procedures performed: None  ?Disposition: Home health ?Diet recommendation:  ?Discharge Diet Orders (From admission, onward)  ? ?  Start     Ordered  ? 05/24/21 0000  Diet - low sodium heart healthy       ? 05/24/21 1014  ? ?  ?  ? ?  ? ?Cardiac diet ?DISCHARGE MEDICATION: ?Allergies as of 05/25/2021   ? ?   Reactions  ? Desyrel [trazodone] Other (See Comments)  ? Pt states this medication brings on seizures   ? Lipitor [atorvastatin] Other (See Comments)  ? Leg pains  ? ?  ? ?  ?Medication List  ?  ? ?TAKE these medications   ? ?Aimovig 140 MG/ML Soaj ?Generic drug: Erenumab-aooe ?Inject 140 mg into the skin every 28 (twenty-eight) days. ?  ?ALPRAZolam 1 MG tablet ?Commonly known as: Duanne Moron ?Take 1 tablet by mouth twice daily ?  ?amLODipine 10 MG tablet ?Commonly known as: NORVASC ?Take 0.5 tablets (5 mg total) by mouth daily. ?  ?aspirin 81 MG EC tablet ?Take 1 tablet (81 mg total) by mouth daily. Swallow whole. ?  ?Baclofen 5 MG Tabs ?Take 5 mg by mouth 2 (two) times daily as needed (muscle spasms). ?  ?clopidogrel 75 MG tablet ?Commonly known as: PLAVIX ?Take 1 tablet (75 mg total) by mouth daily. ?  ?desvenlafaxine 50 MG 24 hr tablet ?Commonly known as: PRISTIQ ?Take 50 mg by mouth daily. ?  ?diclofenac Sodium 1 % Gel ?Commonly known as: VOLTAREN ?Apply 2 g topically 4 (four) times daily as needed (joint paim). ?  ?diphenoxylate-atropine 2.5-0.025 MG tablet ?Commonly known as: Lomotil ?Take 1 tablet by mouth 4 (four) times daily as needed for diarrhea or loose stools. ?  ?estradiol 0.1 MG/GM vaginal cream ?Commonly known as: ESTRACE ?Place 1 Applicatorful vaginally 3 (three) times a week. Estrogen Cream Instruction Discard applicator Apply pea sized amount to tip of finger to urethra before bed. Wash hands well after application. Use Monday, Wednesday and Friday ?  ?gabapentin 300 MG capsule ?Commonly known  as: NEURONTIN ?Take 2 capsules (600 mg total) by mouth 2 (two) times daily. ?  ?hydrocortisone 5 MG tablet ?Commonly known as: CORTEF ?Take 1 tablet (5 mg total) by mouth 2 (two) times daily. ?  ?isosorbide mononitrate 30 MG 24 hr tablet ?Commonly known as: IMDUR ?Take 0.5 tablets (15 mg total) by mouth daily. ?  ?lacosamide 200 MG Tabs tablet ?Commonly known as: VIMPAT ?Take 1 tablet (200 mg total) by mouth 2 (two) times daily. ?  ?levETIRAcetam 1000 MG tablet ?Commonly known as: KEPPRA ?Take 1 tablet (1,000 mg total) by mouth 2 (two) times daily. ?  ?levothyroxine 25 MCG tablet ?Commonly known as: SYNTHROID ?Take 1 tablet by mouth 30 minutes before breakfast ?What changed: See the new instructions. ?  ?metoprolol tartrate 25 MG tablet ?Commonly known as: LOPRESSOR ?Take 1 tablet (25 mg total) by mouth 2 (two) times daily. ?  ?mirabegron ER 50 MG Tb24 tablet ?Commonly known as: MYRBETRIQ ?Take 1 tablet (50 mg total) by mouth daily. ?  ?mirtazapine 7.5 MG tablet ?Commonly known as: REMERON ?Take 1 tablet (7.5 mg total) by mouth at  bedtime. ?  ?nitroGLYCERIN 0.4 MG SL tablet ?Commonly known as: NITROSTAT ?Dissolve 1 tablet under tongue every 13mns, up to 3 doses, for chest pain. Max 3 doses in 352ms. ?What changed: See the new instructions. ?  ?Nurtec 75 MG Tbdp ?Generic drug: Rimegepant Sulfate ?Take 75 mg by mouth as needed. ?What changed: reasons to take this ?  ?ofloxacin 0.3 % ophthalmic solution ?Commonly known as: OCUFLOX ?Place 1 drop into the right eye 3 (three) times daily. ?  ?pantoprazole 40 MG tablet ?Commonly known as: PROTONIX ?Take 1 tablet by mouth twice daily ?  ?prednisoLONE acetate 1 % ophthalmic suspension ?Commonly known as: PRED FORTE ?Place 1 drop into the right eye 3 (three) times daily. ?  ?promethazine 25 MG tablet ?Commonly known as: PHENERGAN ?Take 1 tablet by mouth every 6 hours as needed for nausea or vomiting. ?  ?ranolazine 500 MG 12 hr tablet ?Commonly known as: Ranexa ?Take 1  tablet (500 mg total) by mouth 2 (two) times daily. ?  ?rosuvastatin 40 MG tablet ?Commonly known as: CRESTOR ?Take 1 tablet (40 mg total) by mouth daily at 6 PM. ?  ?SYSTANE OP ?Place 1 drop into both

## 2021-05-25 NOTE — TOC Transition Note (Addendum)
Transition of Care (TOC) - CM/SW Discharge Note ? ? ?Patient Details  ?Name: [facility] ?MRN: [id number] ?Date of Birth: 03-05-1970 ? ?Transition of Care (TOC) CM/SW Contact:  ?Izola Price, RN ?Phone Number: ?05/25/2021, 11:04 AM ? ? ?Clinical Narrative:   UPDATE: 430 pm.  ?Update: Patient is not discharging today per provider due to medical issues. May be transferred to tertiary hospital  ? ?4/23:Patient to be discharge home today, to resume services/or outpatient services. Has used [facility] before. Son will transport home and patient lives with son. Per initial TOC note, no issues with obtaining medications, had DME at home, and has no other questions or concerns regarding discharge plan. Simmie Davies RN CM  ? ? ? ? ?Final next level of care: OP Rehab ?Barriers to Discharge: Barriers Resolved ? ? ?Patient Goals and CMS Choice ?  ?  ?Choice offered to / list presented to : Patient ? ?Discharge Placement ?  ?           ?  ?  ?  ?  ? ?Discharge Plan and Services ?  ?  ?Post Acute Care Choice: Resumption of Svcs/PTA Provider          ?DME Arranged: N/A ?DME Agency: NA ?  ?  ?  ?  ?  ?  ?  ?  ? ?Social Determinants of Health (SDOH) Interventions ?  ? ? ?Readmission Risk Interventions ? ?  05/09/2021  ?  2:17 PM 11/07/2019  ?  4:28 PM 10/18/2019  ?  1:55 PM  ?Readmission Risk Prevention Plan  ?Transportation Screening Complete Complete Complete  ?Medication Review Press photographer) Complete Complete Complete  ?PCP or Specialist appointment within 3-5 days of discharge   Complete  ?[place] or Home Care Consult Complete Complete Complete  ?SW Recovery Care/Counseling Consult   Complete  ?Palliative Care Screening Not Applicable Not Applicable Not Applicable  ?[place] Not Applicable Not Applicable Not Applicable  ? ? ? ? ? ?

## 2021-05-25 NOTE — Progress Notes (Signed)
Gave the Patient orange juice twice during shift when blood glucose fell below 100. Pt is not experiencing s/s of hypoglycemia. ?

## 2021-05-25 NOTE — Progress Notes (Signed)
Pt's blood glucose is 335 at 2154. Pt stated that she just ate some cereal as a snack. Pt is A&O x4 and is not showing signs/symptoms of hyperglycemia. Continue finger sticks every 2 hours as ordered. ?

## 2021-05-25 NOTE — TOC Initial Note (Signed)
Transition of Care (TOC) - Initial/Assessment Note  ? ? ?Patient Details  ?Name: [facility] ?MRN: [id number] ?Date of Birth: May 22, 1970 ? ?Transition of Care (TOC) CM/SW Contact:    ?Izola Price, RN ?Phone Number: ?05/25/2021, 10:59 AM ? ?Clinical Narrative: Patient to be discharged today to home with outpatient therapy. Current with [facility] prior to this admission. Spoke with patient and she lives with adult son who assists with transportation needs. She has all needed DME at home. She confirmed PCP follow up with Dr Caryn Section in one week. She uses Engineering geologist in [place] for immediate RX needs and has a Energy manager for maintenance.  She expressed no issues obtaining or paying for medications. She expressed to concerns or questions regarding discharge plan.  Son will transport patient home at discharge. Simmie Davies RN CM              ? ? ?Expected Discharge Plan: OP Rehab ?Barriers to Discharge: Barriers Resolved ? ? ?Patient Goals and CMS Choice ?  ?  ?Choice offered to / list presented to : Patient ? ?Expected Discharge Plan and Services ?Expected Discharge Plan: OP Rehab ?  ?  ?Post Acute Care Choice: Resumption of Svcs/PTA Provider ?Living arrangements for the past 2 months: [place] ?Expected Discharge Date: 05/25/21               ?DME Arranged: N/A ?DME Agency: NA ?  ?  ?  ?  ?  ?  ?  ?  ? ?Prior Living Arrangements/Services ?Living arrangements for the past 2 months: [place] ?Lives with:: Self, Adult Children ?Patient language and need for interpreter reviewed:: Yes ?Do you feel safe going back to the place where you live?: Yes      ?Need for Family Participation in Patient Care: Yes (Comment) ?Care giver support system in place?: Yes (comment) ?Current home services:  (Was current with [facility] PTA) ?Criminal Activity/Legal Involvement Pertinent to Current Situation/Hospitalization: No - Comment as needed ? ?Activities of Daily Living ?Home Assistive  Devices/Equipment: Eyeglasses, Wheelchair, Environmental consultant (specify type) ?ADL Screening (condition at time of admission) ?Patient's cognitive ability adequate to safely complete daily activities?: Yes ?Is the patient deaf or have difficulty hearing?: No ?Does the patient have difficulty seeing, even when wearing glasses/contacts?: Yes ?Does the patient have difficulty concentrating, remembering, or making decisions?: Yes ?Patient able to express need for assistance with ADLs?: Yes ?Does the patient have difficulty dressing or bathing?: No ?Independently performs ADLs?: Yes (appropriate for developmental age) ?Communication: Independent ?Dressing (OT): Needs assistance ?Is this a change from baseline?: Change from baseline, expected to last <3days ?Grooming: Appropriate for developmental age ? ?Permission Sought/Granted ?Permission sought to share information with : Case Manager, Family Supports ?Permission granted to share information with : Yes, Verbal Permission Granted ?   ?   ? Permission granted to share info w Relationship: Son (see HIPAA restrictions in chart) ? Permission granted to share info w Contact Information: Mastin,Jacob (Son)   [phone] [facility]) ? ?Emotional Assessment ?  ?  ?Affect (typically observed): Accepting ?  ?Alcohol / Substance Use: Not Applicable ?Psych Involvement: No (comment) ? ?Admission diagnosis:  Fall, initial encounter [W19.XXXA] ?Altered mental status, unspecified altered mental status type [R41.82] ?Acute metabolic encephalopathy [X90.24] ?Patient Active Problem List  ? Diagnosis Date Noted  ? Adrenal insufficiency ([place]) 05/25/2021  ? Acute metabolic encephalopathy 09/73/5329  ? Elevated lactic acid level 05/23/2021  ? Chronic diastolic CHF (congestive  heart failure) ([place]) 05/23/2021  ? Esophagitis 05/11/2021  ? Renal artery stenosis, native ([place]) 05/11/2021  ? Obesity, Class III, BMI 40-49.9 (morbid obesity) ([place]) 05/09/2021  ? Chest pain in patient with underlying coronary artery  disease 05/08/2021  ? Cerebrovascular accident (CVA) ([place]) 04/24/2021  ? Generalized idiopathic epilepsy and epileptic syndromes, not intractable, with status epilepticus ([place]) 04/24/2021  ? Gait abnormality 04/24/2021  ? Hypertensive emergency   ? Urinary tract infection without hematuria   ? Hypertension associated with diabetes ([place])   ? Malnutrition of moderate degree 03/10/2021  ? Seizure ([place])   ? Altered mental status 02/08/2021  ? Seizure disorder ([place]) 02/08/2021  ? Acute focal neurological deficit 02/07/2021  ? Chronic kidney disease, stage 3b ([place])   ? Neurogenic bladder   ? Intermittent self-catheterization of bladder   ? History of ESBL E. coli infection   ? Memory loss 01/17/2021  ? Chronic migraine w/o aura w/o status migrainosus, not intractable 01/17/2021  ? Major depressive disorder, recurrent episode, moderate ([place]) 12/08/2020  ? Family history of malignant neoplasm of gastrointestinal tract   ? Near syncope 01/25/2020  ? Pulmonary embolism ([place]) 11/07/2019  ? Type II diabetes mellitus with renal manifestations ([place]) 11/05/2019  ? Hypoglycemia   ? Family history of breast cancer 05/23/2019  ? Family history of colon cancer 05/23/2019  ? Retinopathy 05/12/2019  ? Abnormal LFTs 04/18/2019  ? Esophageal dysphagia   ? Gastrointestinal tract imaging abnormality   ? Malignant neoplasm of vulva, unspecified ([place]) 03/10/2019  ? Peripheral vascular disease, unspecified ([place]) 03/10/2019  ? Paroxysmal supraventricular tachycardia ([place]) 03/10/2019  ? Fall   ? Autonomic neuropathy 03/24/2018  ? H/O gastric bypass 03/02/2018  ? Hypertension associated with stage 3 chronic kidney disease due to type 2 diabetes mellitus ([place]) 02/20/2018  ? Insomnia 03/18/2017  ? Ischemic cardiomyopathy   ? Arthritis   ? Anxiety   ? Hx of colonic polyps   ? CAD (coronary artery disease)   ? Hypertensive heart disease   ? Status post bariatric surgery 06/04/2015  ? NSTEMI (non-ST elevated myocardial infarction) ([place]) 06/03/2015  ?  Carotid stenosis 04/30/2015  ? Iron deficiency anemia 03/22/2015  ? Vitamin B12 deficiency 02/18/2015  ? Malignant melanoma ([place]) 08/25/2014  ? Chronic systolic CHF (congestive heart failure) ([place])   ? Incomplete bladder emptying 07/12/2014  ? Hypothyroidism 12/30/2013  ? Aberrant subclavian artery 11/17/2013  ? Multiple sclerosis ([place]) 11/02/2013  ? History of CVA with residual deficit 06/20/2013  ? Headache, migraine 05/29/2013  ? Hyperlipidemia   ? GERD (gastroesophageal reflux disease)   ? Demyelinating disease ([place]) 01/02/2011  ? Depression with anxiety 05/01/2008  ? Essential hypertension 05/01/2008  ? ?PCP:  [name], MD ?Pharmacy:   ?[facility] [address] [address] ?[address] ([place])  [id number] ?Phone: [phone] Fax: [phone] ? ?[facility] ?[place] ?[place] [address] ?Phone: [phone] Fax: [phone] ? ? ? ? ?Social Determinants of Health (SDOH) Interventions ?  ? ?Readmission Risk Interventions ? ?  05/09/2021  ?  2:17 PM 11/07/2019  ?  4:28 PM 10/18/2019  ?  1:55 PM  ?Readmission Risk Prevention Plan  ?Transportation Screening Complete Complete Complete  ?Medication Review Press photographer) Complete Complete Complete  ?PCP or Specialist appointment within 3-5 days of discharge   Complete  ?[place] or Home Care Consult Complete Complete Complete  ?SW Recovery Care/Counseling Consult   Complete  ?  Palliative Care Screening Not Applicable Not Applicable Not Applicable  ?[place] Not Applicable Not Applicable Not Applicable  ? ? ? ?

## 2021-05-26 ENCOUNTER — Ambulatory Visit: Payer: Medicare Other | Admitting: Physician Assistant

## 2021-05-26 DIAGNOSIS — E2740 Unspecified adrenocortical insufficiency: Secondary | ICD-10-CM | POA: Diagnosis not present

## 2021-05-26 DIAGNOSIS — E162 Hypoglycemia, unspecified: Secondary | ICD-10-CM | POA: Diagnosis not present

## 2021-05-26 LAB — GLUCOSE, CAPILLARY
Glucose-Capillary: 105 mg/dL — ABNORMAL HIGH (ref 70–99)
Glucose-Capillary: 110 mg/dL — ABNORMAL HIGH (ref 70–99)
Glucose-Capillary: 130 mg/dL — ABNORMAL HIGH (ref 70–99)
Glucose-Capillary: 131 mg/dL — ABNORMAL HIGH (ref 70–99)
Glucose-Capillary: 154 mg/dL — ABNORMAL HIGH (ref 70–99)
Glucose-Capillary: 170 mg/dL — ABNORMAL HIGH (ref 70–99)
Glucose-Capillary: 177 mg/dL — ABNORMAL HIGH (ref 70–99)
Glucose-Capillary: 93 mg/dL (ref 70–99)

## 2021-05-26 NOTE — Discharge Summary (Addendum)
Physician Discharge Summary   Patient: [name] MRN: [id number] DOB: Sep 18, 1970  Admit date:     05/23/2021  Discharge date: 05/26/21  Discharge Physician: Marrion Coy   PCP: [name], MD   Recommendations at discharge:   Follow-up with PCP in 1 week. Follow-up with endocrinology in 1 to 2 weeks We were unable to set up appointment with [facility] health endocrinology until November this year, please refer to [facility] endocrinology ASAP.  Discharge Diagnoses: Principal Problem:   Acute metabolic encephalopathy Active Problems:   Depression with anxiety   Essential hypertension   Hyperlipidemia   Hypothyroidism   CAD (coronary artery disease)   Fall   Hypoglycemia   Type II diabetes mellitus with renal manifestations (HCC)   Chronic kidney disease, stage 3b (HCC)   Seizure (HCC)   Elevated lactic acid level   Chronic diastolic CHF (congestive heart failure) (HCC)   Adrenal insufficiency (HCC) Likely insulinoma.  Resolved Problems:   * No resolved hospital problems. *  Hospital Course: [name] is a 51 y.o. 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 Patient had a seizure disorder, EEG did not show any seizure activity.  She was found to have hypoglycemia.  Patient states that that she has recurrent hypoglycemia, she was taking glucose tablets as needed. Brain MRI without acute changes. Due to frequent hypoglycemia, her cortisol level was obtained, patient at least has relative adrenal insufficiency which may be the cause of hypoglycemia.  Patient also had significant elevation of C-peptide and insulin level, MRI of the liver and  pancreas did not show any mass. Patient was given hydrocortisone at the 20 mg twice a day, glucose has been much better since then. At this point, patient will be followed by endocrinologist, Dr. Romero Belling in the next 1 to 2 weeks.  Assessment and Plan: * Acute metabolic encephalopathy Negative EEG and MRI brain.  Most likely due to hypoglycemia.  Condition had improved.  Adrenal insufficiency (HCC) Patient had cortisol level drawn at 9 AM yesterday, was 4.6.  With the significant hypoglycemia, this level of cortisol is low.  We will continue hydrocortisone at 20 mg twice a day for significant hypoglycemia  Chronic diastolic CHF (congestive heart failure) (HCC) Stable.  Elevated lactic acid level No significant source of infection.  Patient is not on metformin.  Most likely source is liver, patient most likely has nonalcoholic steatohepatitis.   Seizure (HCC) EEG did not show any seizure activity  Chronic kidney disease, stage 3b (HCC) Renal function has been stable.  Patient does not have any urinary tension  Type II diabetes mellitus with renal manifestations (HCC) Diet-controlled diabetes.  Recent A1c 5.1, well controlled.  Patient currently does not take any diabetic medication.  Hypoglycemia Patient has a borderline cortisol level, started on hydrocortisone 20 mg twice a day. Patient also had elevated insulin level at 165 and a C-peptide of 19.3.  Significant elevated. But since starting the hydrocortisone, glucose has been running high, did not develop any additional hypoglycemia.  At this point, patient be referred to endocrinologist, will try to call to set up appointment to be seen in 1 to 2 weeks with Dr. Nadyne Coombes. MRI of the abdomen did not  show any mass in the liver or pancreas   Fall Patient has been having for at home.  We will send PT as outpatient  CAD (coronary artery disease) Continue aspirin, Plavix, Crestor, imdur, Ranexa  Hypothyroidism TSH normal  2.213 today Resume home dose Synthroid.  Hyperlipidemia - Crestor  Essential hypertension Resume home regimen.  Depression with anxiety Continue home medications         Consultants: None Procedures performed: None  Disposition: Home Diet recommendation:  Discharge Diet Orders (From admission, onward)     Start     Ordered   05/24/21 0000  Diet - low sodium heart healthy        05/24/21 1014           Cardiac diet DISCHARGE MEDICATION: Allergies as of 05/26/2021       Reactions   Desyrel [trazodone] Other (See Comments)   Pt states this medication brings on seizures    Lipitor [atorvastatin] Other (See Comments)   Leg pains        Medication List     TAKE these medications    Aimovig 140 MG/ML Soaj Generic drug: Erenumab-aooe Inject 140 mg into the skin every 28 (twenty-eight) days.   ALPRAZolam 1 MG tablet Commonly known as: XANAX Take 1 tablet by mouth twice daily   amLODipine 10 MG tablet Commonly known as: NORVASC Take 0.5 tablets (5 mg total) by mouth daily.   aspirin 81 MG EC tablet Take 1 tablet (81 mg total) by mouth daily. Swallow whole.   Baclofen 5 MG Tabs Take 5 mg by mouth 2 (two) times daily as needed (muscle spasms).   clopidogrel 75 MG tablet Commonly known as: PLAVIX Take 1 tablet (75 mg total) by mouth daily.   desvenlafaxine 50 MG 24 hr tablet Commonly known as: PRISTIQ Take 1 tablet by mouth every morning What changed: when to take this   diclofenac Sodium 1 % Gel Commonly known as: VOLTAREN Apply 2 g topically 4 (four) times daily as needed (joint paim).   diphenoxylate-atropine 2.5-0.025 MG tablet Commonly known as: Lomotil Take 1 tablet by mouth 4 (four) times daily as needed for diarrhea or loose stools.   estradiol 0.1 MG/GM vaginal cream Commonly known as: ESTRACE Place 1 Applicatorful vaginally 3 (three) times a week. Estrogen Cream Instruction Discard applicator Apply pea sized amount to tip of finger  to urethra before bed. Wash hands well after application. Use Monday, Wednesday and Friday   gabapentin 300 MG capsule Commonly known as: NEURONTIN Take 2 capsules by mouth twice daily   hydrocortisone 20 MG tablet Commonly known as: CORTEF Take 1 tablet (20 mg total) by mouth 2 (two) times daily.   isosorbide mononitrate 30 MG 24 hr tablet Commonly known as: IMDUR Take 0.5 tablets (15 mg total) by mouth daily.   lacosamide 200 MG Tabs tablet Commonly known as: VIMPAT Take 1 tablet (200 mg total) by mouth 2 (two) times daily.   levETIRAcetam 1000 MG tablet Commonly known as: KEPPRA Take 1 tablet (1,000 mg total) by mouth 2 (two) times daily.   levothyroxine 25 MCG tablet Commonly known as: SYNTHROID Take 1 tablet (25 mcg total) by mouth daily before breakfast. What changed: See the new instructions.   metoprolol tartrate 25 MG tablet Commonly known as: LOPRESSOR Take 1 tablet (25 mg total) by mouth 2 (two) times daily.   mirabegron ER 50 MG Tb24 tablet Commonly known as: MYRBETRIQ Take 1 tablet (50 mg total) by mouth daily.  mirtazapine 7.5 MG tablet Commonly known as: REMERON Take 1 tablet (7.5 mg total) by mouth at bedtime.   nitroGLYCERIN 0.4 MG SL tablet Commonly known as: NITROSTAT Dissolve 1 tablet under tongue every , up to 3 doses, for chest pain. Max 3 doses in . What changed: See the new instructions.   Nurtec 75 MG Tbdp Generic drug: Rimegepant Sulfate Take 75 mg by mouth as needed. What changed: reasons to take this   ofloxacin 0.3 % ophthalmic solution Commonly known as: OCUFLOX Place 1 drop into the right eye 3 (three) times daily.   pantoprazole 40 MG tablet Commonly known as: PROTONIX Take 1 tablet by mouth twice daily   prednisoLONE acetate 1 % ophthalmic suspension Commonly known as: PRED FORTE Place 1 drop into the right eye 3 (three) times daily.   promethazine 25 MG tablet Commonly known as: PHENERGAN Take 1 tablet by  mouth every 6 hours as needed for nausea or vomiting.   ranolazine 500 MG 12 hr tablet Commonly known as: Ranexa Take 1 tablet (500 mg total) by mouth 2 (two) times daily.   rosuvastatin 40 MG tablet Commonly known as: CRESTOR Take 1 tablet (40 mg total) by mouth daily at 6 PM.   SYSTANE OP Place 1 drop into both eyes daily as needed (dry eyes).        Follow-up Information     [name], MD Follow up in 1 week(s).   Specialty: Family Medicine Why: Patient to make own follow up appt Office closed at this time Contact information: [address] [place] [address] [phone]         Antonieta Iba, MD .   Specialty: Cardiology Why: Patient to make own follow up appt Office closed at this time Contact information: [address] [place] [address] [phone]         Romero Belling, MD Follow up.   Specialty: Endocrinology Contact information: [address] [place] [employer] [place] [address] [phone]                Discharge Exam: Ceasar Mons Weights   05/23/21 0730 05/26/21 0500  Weight: 75.2 kg 67.9 kg   General exam: Appears calm and comfortable  Respiratory system: Clear to auscultation. Respiratory effort normal. Cardiovascular system: S1 & S2 heard, RRR. No JVD, murmurs, rubs, gallops or clicks. No pedal edema. Gastrointestinal system: Abdomen is nondistended, soft and nontender. No organomegaly or masses felt. Normal bowel sounds heard. Central nervous system: Alert and oriented. No focal neurological deficits. Extremities: Symmetric 5 x 5 power. Skin: No rashes, lesions or ulcers Psychiatry: Judgement and insight appear normal. Mood & affect appropriate.    Condition at discharge: good  The results of significant diagnostics from this hospitalization (including imaging, microbiology, ancillary and laboratory) are listed below for reference.   Imaging Studies: DG Chest 2 View  Result Date:  05/08/2021 CLINICAL DATA:  Chest pain EXAM: CHEST - 2 VIEW COMPARISON:  None. FINDINGS: The cardiomediastinal silhouette is within normal limits. Unchanged metallic densities overlying the upper mediastinum. No focal airspace consolidation. No pleural effusion. No pneumothorax. There is no acute osseous abnormality. Thoracic spondylosis. IMPRESSION: No evidence of acute cardiopulmonary disease. Electronically Signed   By: Caprice Renshaw M.D.   On: 05/08/2021 13:22   DG Chest 2 View  Result Date: 05/05/2021 CLINICAL DATA:  Decreased balance.  Fall. EXAM: CHEST - 2 VIEW COMPARISON:  03/08/2021 FINDINGS: Lateral view degraded by patient arm  position. Mild right hemidiaphragm elevation. Midline trachea. Normal heart size. No pleural effusion or pneumothorax. Surgical clips in the left side of the thoracic inlet. Right-sided aortic arch with aberrant left-sided subclavian artery, as on prior CT. No acute osseous abnormality. IMPRESSION: No acute cardiopulmonary disease. Electronically Signed   By: Jeronimo Greaves M.D.   On: 05/05/2021 13:59   CT Head Wo Contrast  Result Date: 05/23/2021 CLINICAL DATA:  Fall while transferring from wheelchair to recliner this morning. Fluctuating orientation. EXAM: CT HEAD WITHOUT CONTRAST CT CERVICAL SPINE WITHOUT CONTRAST TECHNIQUE: Multidetector CT imaging of the head and cervical spine was performed following the standard protocol without intravenous contrast. Multiplanar CT image reconstructions of the cervical spine were also generated. RADIATION DOSE REDUCTION: This exam was performed according to the departmental dose-optimization program which includes automated exposure control, adjustment of the mA and/or kV according to patient size and/or use of iterative reconstruction technique. COMPARISON:  CT head and cervical spine 05/05/2021 FINDINGS: CT HEAD FINDINGS Brain: No evidence of acute infarction, hemorrhage, hydrocephalus, extra-axial collection or mass lesion/mass effect.  Scattered hypodensities in the periventricular and subcortical white matter, consistent with chronic small-vessel ischemic changes. Chronic infarcts in the right basal ganglia, left thalamus, and right pons. Vascular: Vascular calcifications at the skull base. No hyperdense vessel. Skull: Normal. Negative for fracture or focal lesion. Sinuses/Orbits: No acute finding. Paranasal sinuses and mastoid air cells are clear. Other: None. CT CERVICAL SPINE FINDINGS Alignment: Normal. Skull base and vertebrae: No acute fracture. No primary bone lesion or focal pathologic process. Soft tissues and spinal canal: No prevertebral fluid or swelling. No visible canal hematoma. Disc levels: Severe osteoarthritis at the atlantodental joint. Mild degenerative disc disease at all cervical levels with anterior osteophyte formation. Disc space height is relatively preserved. No significant facet arthropathy or neural foraminal stenosis. Upper chest: No acute abnormality. Small amount of secretions noted in the mildly patulous upper esophagus. Other: None. IMPRESSION: CT head: No acute intracranial abnormality. Stable exam compared to 05/05/2021. CT cervical spine: No fracture or traumatic malalignment. Multilevel mild degenerative disc disease. Electronically Signed   By: Sherron Ales M.D.   On: 05/23/2021 08:30   CT Head Wo Contrast  Result Date: 05/05/2021 CLINICAL DATA:  Seizures, balance issues EXAM: CT HEAD WITHOUT CONTRAST CT CERVICAL SPINE WITHOUT CONTRAST TECHNIQUE: Multidetector CT imaging of the head and cervical spine was performed following the standard protocol without intravenous contrast. Multiplanar CT image reconstructions of the cervical spine were also generated. RADIATION DOSE REDUCTION: This exam was performed according to the departmental dose-optimization program which includes automated exposure control, adjustment of the mA and/or kV according to patient size and/or use of iterative reconstruction technique.  COMPARISON:  CT head 03/31/2021, CT cervical spine and CT head 12/05/2020. FINDINGS: CT HEAD FINDINGS Brain: No evidence of acute infarction, hemorrhage, cerebral edema, mass, mass effect, or midline shift. No hydrocephalus or extra-axial fluid collection. Periventricular white matter changes, likely the sequela of chronic small vessel ischemic disease. Right pons, left thalamic, and right basal ganglia lacunar infarcts. Remote left parietal cortical infarct. Vascular: No hyperdense vessel. Atherosclerotic calcifications in the intracranial carotid and vertebral arteries. Skull: Normal. Negative for fracture or focal lesion. Sinuses/Orbits: Bubbly fluid in the sphenoid sinuses. Otherwise clear. Status post bilateral lens replacements. Other: The mastoid air cells are well aerated. CT CERVICAL SPINE FINDINGS Alignment: No listhesis. Skull base and vertebrae: No acute fracture. No primary bone lesion or focal pathologic process. Large anterior osteophytes. Soft tissues and spinal canal: No  prevertebral fluid or swelling. No visible canal hematoma. Disc levels: Relatively preserved disc heights. No high-grade spinal canal stenosis or neural foraminal narrowing. Upper chest: Negative. Other: None. IMPRESSION: 1.  No acute intracranial process. 2.  No acute fracture or traumatic listhesis in the cervical spine. Electronically Signed   By: Wiliam Ke M.D.   On: 05/05/2021 12:15   CT Cervical Spine Wo Contrast  Result Date: 05/23/2021 CLINICAL DATA:  Fall while transferring from wheelchair to recliner this morning. Fluctuating orientation. EXAM: CT HEAD WITHOUT CONTRAST CT CERVICAL SPINE WITHOUT CONTRAST TECHNIQUE: Multidetector CT imaging of the head and cervical spine was performed following the standard protocol without intravenous contrast. Multiplanar CT image reconstructions of the cervical spine were also generated. RADIATION DOSE REDUCTION: This exam was performed according to the departmental  dose-optimization program which includes automated exposure control, adjustment of the mA and/or kV according to patient size and/or use of iterative reconstruction technique. COMPARISON:  CT head and cervical spine 05/05/2021 FINDINGS: CT HEAD FINDINGS Brain: No evidence of acute infarction, hemorrhage, hydrocephalus, extra-axial collection or mass lesion/mass effect. Scattered hypodensities in the periventricular and subcortical white matter, consistent with chronic small-vessel ischemic changes. Chronic infarcts in the right basal ganglia, left thalamus, and right pons. Vascular: Vascular calcifications at the skull base. No hyperdense vessel. Skull: Normal. Negative for fracture or focal lesion. Sinuses/Orbits: No acute finding. Paranasal sinuses and mastoid air cells are clear. Other: None. CT CERVICAL SPINE FINDINGS Alignment: Normal. Skull base and vertebrae: No acute fracture. No primary bone lesion or focal pathologic process. Soft tissues and spinal canal: No prevertebral fluid or swelling. No visible canal hematoma. Disc levels: Severe osteoarthritis at the atlantodental joint. Mild degenerative disc disease at all cervical levels with anterior osteophyte formation. Disc space height is relatively preserved. No significant facet arthropathy or neural foraminal stenosis. Upper chest: No acute abnormality. Small amount of secretions noted in the mildly patulous upper esophagus. Other: None. IMPRESSION: CT head: No acute intracranial abnormality. Stable exam compared to 05/05/2021. CT cervical spine: No fracture or traumatic malalignment. Multilevel mild degenerative disc disease. Electronically Signed   By: Sherron Ales M.D.   On: 05/23/2021 08:30   CT CERVICAL SPINE WO CONTRAST  Result Date: 05/05/2021 CLINICAL DATA:  Seizures, balance issues EXAM: CT HEAD WITHOUT CONTRAST CT CERVICAL SPINE WITHOUT CONTRAST TECHNIQUE: Multidetector CT imaging of the head and cervical spine was performed following the  standard protocol without intravenous contrast. Multiplanar CT image reconstructions of the cervical spine were also generated. RADIATION DOSE REDUCTION: This exam was performed according to the departmental dose-optimization program which includes automated exposure control, adjustment of the mA and/or kV according to patient size and/or use of iterative reconstruction technique. COMPARISON:  CT head 03/31/2021, CT cervical spine and CT head 12/05/2020. FINDINGS: CT HEAD FINDINGS Brain: No evidence of acute infarction, hemorrhage, cerebral edema, mass, mass effect, or midline shift. No hydrocephalus or extra-axial fluid collection. Periventricular white matter changes, likely the sequela of chronic small vessel ischemic disease. Right pons, left thalamic, and right basal ganglia lacunar infarcts. Remote left parietal cortical infarct. Vascular: No hyperdense vessel. Atherosclerotic calcifications in the intracranial carotid and vertebral arteries. Skull: Normal. Negative for fracture or focal lesion. Sinuses/Orbits: Bubbly fluid in the sphenoid sinuses. Otherwise clear. Status post bilateral lens replacements. Other: The mastoid air cells are well aerated. CT CERVICAL SPINE FINDINGS Alignment: No listhesis. Skull base and vertebrae: No acute fracture. No primary bone lesion or focal pathologic process. Large anterior osteophytes. Soft tissues and  spinal canal: No prevertebral fluid or swelling. No visible canal hematoma. Disc levels: Relatively preserved disc heights. No high-grade spinal canal stenosis or neural foraminal narrowing. Upper chest: Negative. Other: None. IMPRESSION: 1.  No acute intracranial process. 2.  No acute fracture or traumatic listhesis in the cervical spine. Electronically Signed   By: Wiliam Ke M.D.   On: 05/05/2021 12:15   CT Thoracic Spine Wo Contrast  Result Date: 05/05/2021 CLINICAL DATA:  Patient states she has difficulty with balance, fell 3 times yesterday. EXAM: CT THORACIC  AND LUMBAR SPINE WITHOUT CONTRAST TECHNIQUE: Multidetector CT imaging of the thoracic and lumbar spine was performed without intravenous contrast. Multiplanar CT image reconstructions were also generated. RADIATION DOSE REDUCTION: This exam was performed according to the departmental dose-optimization program which includes automated exposure control, adjustment of the mA and/or kV according to patient size and/or use of iterative reconstruction technique. COMPARISON:  None. FINDINGS: CT THORACIC SPINE FINDINGS Alignment: Normal. Vertebrae: No acute fracture or focal pathologic process. Paraspinal and other soft tissues: Right upper lobe ground-glass opacity concerning for atelectasis or infiltrate. Disc levels: Multilevel degenerate disc disease with Schmorl node in the inferior endplate of T5, T6, T8 and T11. CT LUMBAR SPINE FINDINGS Segmentation: 5 lumbar type vertebrae. Alignment: Normal. Vertebrae: No acute fracture or focal pathologic process. Paraspinal and other soft tissues: Negative. Disc levels: Mild multilevel degenerative disc disease of the lumbar spine. Small anterior osteophytes at multiple levels. Mild facet joint arthropathy. IMPRESSION: Thoracic spine: 1.  No evidence of fracture or subluxation. 2. Multilevel degenerate disc disease, with Schmorl nodes at T5, T6, T8 and T11. No significant spinal canal or neural foraminal stenosis. 3. Small subcentimeter ground-glass opacities in the right upper lobe, which may represent atelectasis or infectious process. Clinical correlation is suggested. Lumbar spine 4.  No acute fracture or subluxation of the lumbar spine. 5. Degenerate disc disease of the lumbar spine prominent at L4-L5 and L5-S1. Electronically Signed   By: Larose Hires D.O.   On: 05/05/2021 13:55   CT Lumbar Spine Wo Contrast  Result Date: 05/05/2021 CLINICAL DATA:  Patient states she has difficulty with balance, fell 3 times yesterday. EXAM: CT THORACIC AND LUMBAR SPINE WITHOUT CONTRAST  TECHNIQUE: Multidetector CT imaging of the thoracic and lumbar spine was performed without intravenous contrast. Multiplanar CT image reconstructions were also generated. RADIATION DOSE REDUCTION: This exam was performed according to the departmental dose-optimization program which includes automated exposure control, adjustment of the mA and/or kV according to patient size and/or use of iterative reconstruction technique. COMPARISON:  None. FINDINGS: CT THORACIC SPINE FINDINGS Alignment: Normal. Vertebrae: No acute fracture or focal pathologic process. Paraspinal and other soft tissues: Right upper lobe ground-glass opacity concerning for atelectasis or infiltrate. Disc levels: Multilevel degenerate disc disease with Schmorl node in the inferior endplate of T5, T6, T8 and T11. CT LUMBAR SPINE FINDINGS Segmentation: 5 lumbar type vertebrae. Alignment: Normal. Vertebrae: No acute fracture or focal pathologic process. Paraspinal and other soft tissues: Negative. Disc levels: Mild multilevel degenerative disc disease of the lumbar spine. Small anterior osteophytes at multiple levels. Mild facet joint arthropathy. IMPRESSION: Thoracic spine: 1.  No evidence of fracture or subluxation. 2. Multilevel degenerate disc disease, with Schmorl nodes at T5, T6, T8 and T11. No significant spinal canal or neural foraminal stenosis. 3. Small subcentimeter ground-glass opacities in the right upper lobe, which may represent atelectasis or infectious process. Clinical correlation is suggested. Lumbar spine 4.  No acute fracture or subluxation of the lumbar  spine. 5. Degenerate disc disease of the lumbar spine prominent at L4-L5 and L5-S1. Electronically Signed   By: Larose Hires D.O.   On: 05/05/2021 13:55   MR BRAIN WO CONTRAST  Result Date: 05/23/2021 CLINICAL DATA:  Dizziness, persistent/recurrent, cardiac or vascular cause suspected Mental status change, unknown cause Headache, new or worsening (Age >= 50y) EXAM: MRI HEAD  WITHOUT CONTRAST TECHNIQUE: Multiplanar, multiecho pulse sequences of the brain and surrounding structures were obtained without intravenous contrast. COMPARISON:  MRI March 14, 2021. FINDINGS: Brain: Decreased conspicuity of mild DWI hyperintensity in the left centrum semiovale with correlate ADC hyperintensity, compatible with prior infarcts. No evidence of acute infarct. No acute hemorrhage, hydrocephalus, mass lesion, midline shift, or extra-axial fluid collection. Chronic left parietal infarct. Similar carotid worklist for ischemic disease and prominent perivascular spaces of the central gray nuclei in central white matter. Similar chronic infarcts in the pons and cerebellum. Mild scattered punctate foci of susceptibility artifact, compatible with prior microhemorrhages. Vascular: Major arterial flow voids maintained skull base Skull and upper cervical spine: Normal marrow signal. Sinuses/Orbits: Clear sinuses. No acute orbital findings. Other: No mastoid effusions. IMPRESSION: 1. No evidence of acute intracranial abnormality. 2. Age-advanced chronic microvascular ischemic disease and remote infarcts, as detailed above. Electronically Signed   By: Feliberto Harts M.D.   On: 05/23/2021 10:16   CARDIAC CATHETERIZATION  Result Date: 05/12/2021 Conclusions: Severe two-vessel coronary artery disease with 95% distal RCA stenosis as well as multisegmental distal LAD disease of up to 70% and 90% ostial D2 stenosis. Moderate, noncritical mid RCA and proximal LCx disease. Patent D1 and distal LAD stents.  The distal LAD stent demonstrates up to 30% eccentric in-stent restenosis. Mildly elevated left ventricular filling pressure (LVEDP 15-20 mmHg). Successful PCI to distal RCA using Onyx Frontier 3.0 x 15 mm drug-eluting stent with 0% residual stenosis and TIMI-3 flow. Coronary vasospasm versus relatively small caliber of right radial artery precluding advancement of 8F guide catheter.  Intervention successfully  performed using 23F JR4 guide catheter.  Alternative access may need to be considered for future interventions. Recommendations: Continue indefinite dual antiplatelet therapy with aspirin and clopidogrel. Gentle post catheterization hydration to maintain net even fluid balance given acute kidney injury superimposed on chronic kidney disease and mildly elevated LVEDP during catheterization today. Aggressive secondary prevention of coronary artery disease. Yvonne Kendall, MD [facility]  NM Myocar Multi W/Spect Izetta Dakin Motion / EF  Result Date: 05/09/2021   Findings are consistent with ischemia. The study is intermediate risk.   No ST deviation was noted.   LV perfusion is abnormal. There is evidence of ischemia. There is no evidence of infarction. Defect 1: There is a medium defect with moderate reduction in uptake present in the mid to basal inferior and inferolateral location(s) that is partially reversible. There is normal wall motion in the defect area. Consistent with ischemia.   Left ventricular function is abnormal. End diastolic cavity size is normal. End systolic cavity size is normal.   There is evidence of inferior wall ischemia but the study is suboptimal due to GI uptake.   DG Chest Port 1 View  Result Date: 05/23/2021 CLINICAL DATA:  Altered mental status. EXAM: PORTABLE CHEST 1 VIEW COMPARISON:  May 08, 2021. FINDINGS: The heart size and mediastinal contours are within normal limits. Both lungs are clear. The visualized skeletal structures are unremarkable. IMPRESSION: No active disease. Electronically Signed   By: Lupita Raider M.D.   On: 05/23/2021 08:13   EEG adult  Result Date: 05/23/2021 Charlsie Quest, MD     05/23/2021  4:50 PM Patient Name: Donyelle Hoen MRN: [id number] Epilepsy Attending: Charlsie Quest Referring Physician/Provider: Lorretta Harp, MD Date: 05/23/2021 Duration: 36.35 mins Patient history: 50yo f with h/o seizure, admitted with ams. EEG to evaluate for  seizure. Level of alertness: Awake AEDs during EEG study: LEV, LCM, GBP,  xanax Technical aspects: This EEG study was done with scalp electrodes positioned according to the 10-20 International system of electrode placement. Electrical activity was acquired at a sampling rate of 500Hz  and reviewed with a high frequency filter of 70Hz  and a low frequency filter of 1Hz . EEG data were recorded continuously and digitally stored. Description: The posterior dominant rhythm consists of 9 Hz activity of moderate voltage (25-35 uV) seen predominantly in posterior head regions, symmetric and reactive to eye opening and eye closing. Physiologic photic driving was not seen during photic stimulation.  Hyperventilation was not performed.   IMPRESSION: This study is within normal limits. No seizures or epileptiform discharges were seen throughout the recording. Charlsie Quest   MR ABDOMEN MRCP W WO CONTAST  Result Date: 05/25/2021 CLINICAL DATA:  Persistent hypoglycemia, elevated insulin level, evaluate for insulinoma EXAM: MRI ABDOMEN WITHOUT AND WITH CONTRAST (INCLUDING MRCP) TECHNIQUE: Multiplanar multisequence MR imaging of the abdomen was performed both before and after the administration of intravenous contrast. Heavily T2-weighted images of the biliary and pancreatic ducts were obtained, and three-dimensional MRCP images were rendered by post processing. CONTRAST:  7.49mL GADAVIST GADOBUTROL 1 MMOL/ML IV SOLN COMPARISON:  None. FINDINGS: Lower chest: Small left pleural effusion. Hepatobiliary: No mass or other parenchymal abnormality identified. Status post cholecystectomy. No biliary ductal dilatation. Pancreas: No mass, inflammatory changes, or other parenchymal abnormality identified.No pancreatic ductal dilatation. Spleen:  Within normal limits in size and appearance. Adrenals/Urinary Tract: Normal adrenal glands. No renal masses or suspicious contrast enhancement identified. No evidence of hydronephrosis.  Stomach/Bowel: Visualized portions within the abdomen are unremarkable. Vascular/Lymphatic: No pathologically enlarged lymph nodes identified. No abdominal aortic aneurysm demonstrated. Other:  None. Musculoskeletal: No suspicious osseous lesions identified. IMPRESSION: 1. No evidence of pancreatic or hepatic mass or suspicious contrast enhancement to suggest macroscopic insulinoma. 2. Small left pleural effusion. 3. Status post cholecystectomy. No biliary ductal dilatation. Electronically Signed   By: Jearld Lesch M.D.   On: 05/25/2021 16:56   CT Angio Chest/Abd/Pel for Dissection W and/or W/WO  Result Date: 05/10/2021 CLINICAL DATA:  Chest or back pain, aortic dissection suspected. The patient has severe chest pain with blood pressure 213/112. No ST-elevation on EKG. EXAM: CT ANGIOGRAPHY CHEST, ABDOMEN AND PELVIS TECHNIQUE: Non-contrast CT of the chest was initially obtained. Multidetector CT imaging through the chest, abdomen and pelvis was performed using the standard protocol during bolus administration of intravenous contrast. Multiplanar reconstructed images and MIPs were obtained and reviewed to evaluate the vascular anatomy. RADIATION DOSE REDUCTION: This exam was performed according to the departmental dose-optimization program which includes automated exposure control, adjustment of the mA and/or kV according to patient size and/or use of iterative reconstruction technique. CONTRAST:  75mL OMNIPAQUE IOHEXOL 350 MG/ML SOLN COMPARISON:  CTA chest 11/18/2019, abdomen and pelvis CT with contrast 12/05/2020 FINDINGS: CTA CHEST FINDINGS Cardiovascular: There is mild cardiomegaly with a left chamber predominance and no findings of acute right heart strain. There is no pericardial effusion. The pulmonary veins are decompressed. There is patchy calcification in the LAD and circumflex coronary arteries trace calcification in the right coronary artery. Minimal aortic  arch atherosclerosis with right-sided arch  and descending segment. There is no aortic hematoma, aneurysm, dissection or penetrating ulcer or further significant plaques. Again noted is an aberrant left subclavian artery, embolized with a closure device with patent bypass graft from the posterolateral left common carotid artery to the left subclavian artery with good opacification of both subclavian arteries. The pulmonary arteries are normal in caliber without evidence of embolus to the segmental level. Mediastinum/Nodes: The esophagus is again noted compressed between the aberrant left subclavian artery and trachea, above this the proximal thoracic esophagus is increasingly thickened. Below this the esophagus is mildly thickened and patulous but no more than previously. There is no thyroid mass, intrathoracic or axillary adenopathy. There are old surgical clips in the left superior mediastinum. The trachea is clear. Lungs/Pleura: Minimal layering left and trace right pleural effusions are new from prior studies. There is no pneumothorax. Accessory azygous lobe and fissure again noted medial right apex. Minimal centrilobular emphysema both lung apices. There is chronic elevation of the right hemidiaphragm, unchanged. Minimal atelectasis noted both posterior bases. There is scattered ground-glass disease in the posterior aspect of the right upper lobe and in the lateral aspect of the left upper lobe mid field, and in the superior segment of the left lower lobe. 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 Similar ground-glass disease was noted previously in the right upper lobe. Musculoskeletal: There are degenerative disc changes, spondylosis and Schmorl's nodes in the mid to lower thoracic spine but no destructive osseous process. The ribcage is intact. Review of the MIP images confirms the above findings. CTA ABDOMEN AND PELVIS FINDINGS VASCULAR Aorta: There is minimal scattered calcific plaque without aneurysm, dissection, vasculitis or stenosis, or  penetrating ulcer. Celiac: Patent without evidence of aneurysm, dissection, vasculitis or significant stenosis. SMA: Patent without evidence of aneurysm, dissection, vasculitis or significant stenosis. Renals: There are bilateral single renal arteries. There is a 40% soft plaque stenosis in the proximal 5 mm of the right renal artery, and 80-90% soft plaque stenosis in the proximal 9 mm of the left renal artery. Both renal arteries are otherwise clear. IMA: Patent without evidence of aneurysm, dissection, vasculitis or significant stenosis. Inflow: Patent without evidence of aneurysm, dissection, vasculitis or significant stenosis. Scattered nonstenosing calcific plaques are again noted in the common iliac and internal iliac arteries. Veins: No obvious venous abnormality within the limitations of this arterial phase study. Review of the MIP images confirms the above findings. NON-VASCULAR Hepatobiliary: 20.5 cm length mildly steatotic liver without mass. Status post cholecystectomy without biliary dilatation. Pancreas: No focal abnormality or ductal dilatation. Spleen: No focal abnormality or splenomegaly. Adrenals/Urinary Tract: There is no adrenal mass. There is bilateral renal cortical thinning and scarring, more on the left, both kidneys measuring small length at 8.6 cm. There is no mass enhancement common no urinary stone or obstruction. The bladder is moderately distended with the dome reaching the level of L4-5. No focal wall thickening is seen. Stomach/Bowel: Old gastric bypass. The unopacified small and large bowel without pathologic dilatation or wall thickening. An appendix is not seen. Moderate fecal stasis. Uncomplicated left colonic diverticulosis. Lymphatic: No appreciable adenopathy in the abdomen or pelvis. There are scattered subcutaneous opacities in the anterior abdominal wall not seen previously, nonspecific but may be sites of subcutaneous injection. Reproductive: The uterus is intact.  The  ovaries are normal in size. Other: There is no free air, hemorrhage or fluid Musculoskeletal: Evidence of prior left hip nailing. Degenerative change lumbar spine. Moderate acquired  spinal stenosis L4-5. Review of the MIP images confirms the above findings. IMPRESSION: 1. No acute CTA abnormality. No evidence of thoracic or abdominal aortic aneurysm. 2. Small amounts of aortic atherosclerosis, right-sided aortic arch with surgically occluded aberrant left subclavian artery, with patent left common carotid to left subclavian bypass graft. 3. The only flow-limiting stenoses involve the most proximal renal arteries, with soft plaque stenosis of 40% on the right and 80-90% on the left. 4. Coronary artery calcifications heaviest in the LAD. 5. Chronic compression of the esophagus between the aberrant left subclavian artery and trachea, with increasing thickening in the more cephalad thoracic esophagus, chronic thickening and patulous appearance in the more distal thoracic esophagus. 6. Scattered ground-glass opacities of the lungs most likely due to multifocal pneumonitis including viral and atypical etiologies. Follow-up study recommended to ensure clearing. 7. Bilateral renal cortical thinning and scarring with small renal lengths. 8. Hepatic steatosis and enlargement.  Unchanged. 9. Constipation and diverticulosis.  Old gastric bypass. 10. Moderately distended bladder without focal abnormality. 11. Scattered subcutaneous opacities in the abdominal wall not seen previously, possibly sites of subcutaneous injection but clinical correlation advised. Electronically Signed   By: Almira Bar M.D.   On: 05/10/2021 20:47    Microbiology: Results for orders placed or performed during the hospital encounter of 05/23/21  Blood Culture (routine x 2)     Status: None (Preliminary result)   Collection Time: 05/23/21  7:38 AM   Specimen: BLOOD  Result Value Ref Range Status   Specimen Description BLOOD BLOOD RIGHT FOREARM   Final   Special Requests   Final    BOTTLES DRAWN AEROBIC AND ANAEROBIC Blood Culture adequate volume   Culture   Final    NO GROWTH 3 DAYS Performed at [facility], [address], [place], [place] [address]    Report Status PENDING  Incomplete  Urine Culture     Status: None   Collection Time: 05/23/21  7:38 AM   Specimen: In/Out Cath Urine  Result Value Ref Range Status   Specimen Description   Final    IN/OUT CATH URINE Performed at [facility], [address] [place], [place] [address]    Special Requests   Final    NONE Performed at [facility], [address], [place], [place] [address]    Culture   Final    NO GROWTH Performed at [facility], [address], [place], [place] [address]    Report Status 05/24/2021 FINAL  Final  Blood Culture (routine x 2)     Status: None (Preliminary result)   Collection Time: 05/23/21  7:11 PM   Specimen: BLOOD  Result Value Ref Range Status   Specimen Description BLOOD BLOOD RIGHT WRIST  Final   Special Requests   Final    BOTTLES DRAWN AEROBIC AND ANAEROBIC Blood Culture adequate volume   Culture   Final    NO GROWTH 3 DAYS Performed at [facility], [address], [place], [place] [address]    Report Status PENDING  Incomplete   *Note: Due to a large number of results and/or encounters for the requested time period, some results have not been displayed. A complete set of results can be found in Results Review.    Labs: CBC: Recent Labs  Lab 05/23/21 0738 05/25/21 0608  WBC 7.3 7.0  NEUTROABS 5.2  --   HGB 12.1 10.7*  HCT 40.6 34.8*  MCV 87.1 85.5  PLT 141* 131*  Basic Metabolic Panel: Recent Labs  Lab 05/23/21 0855 05/24/21 0525 05/25/21 0608  NA 140 143 142  K 4.7 4.4 4.7  CL 114* 113* 112*  CO2 17* 25 26  GLUCOSE 60* 115* 86  BUN 16 21* 26*  CREATININE 1.06* 1.42* 1.44*  CALCIUM 7.1* 8.6* 8.7*  MG  --   --  1.8   Liver Function Tests: Recent Labs   Lab 05/23/21 0855  AST 50*  ALT 51*  ALKPHOS 114  BILITOT 0.6  PROT 4.3*  ALBUMIN 2.2*   CBG: Recent Labs  Lab 05/26/21 0229 05/26/21 0457 05/26/21 0636 05/26/21 0739 05/26/21 1014  GLUCAP 130* 131* 105* 93 170*    Discharge time spent: greater than 30 minutes.  Signed: Marrion Coy, MD [facility] 05/26/2021

## 2021-05-26 NOTE — Telephone Encounter (Signed)
Please advise if ok to refill Isosorbide Mono. 120 mg qd. ?Last filled by Ivor Costa, MD ?

## 2021-05-26 NOTE — Progress Notes (Signed)
Occupational Therapy Treatment ?Patient Details ?Name: [facility] ?MRN: [id number] ?DOB: [birth year] ?Today's Date: 05/26/2021 ? ? ?History of present illness Pt is a 51 y/o F admitted on 05/23/21 after presenting with c/o fall & AMS. Pt is being treated for acute metabolic encephalopathy. PMH: HTN, HLD, DM, stroke, GERD, hypothyroidism, depression, anxiety, CAD, s/p DES placement, gastric ulcer disease, colitis, anemia, aortic arch aneurysm, dCHF, PSVT, CKD-3, s/p gastric bypass, s/p bypass to L carotid to subclavian artery ?  ?OT comments ? Pt seen for OT tx this day.  Pt tolerated functional mobility in room for ADL completion.  Min guard with RW, SBA for grooming tasks at sink.  OT placed 3in1 over toilet at end of session d/t sit to stand from toilet with increased effort.  Encouraged pt begin walking with assist to bathroom vs use of commode at bedside in order to increase activity level.  Pt receptive/verbalized understanding.  Pt was able to don socks and pajama pants sitting on commode, requiring min A d/t difficulty keeping legs crossed.  OT advised on benefits of sockaid, but pt states she does a little better with foot stool at home with extra time.  Pt stood at sink for grooming tasks with SBA.  Assisted pt to amb to recliner end of session.  All necessary items placed within reach, chair alarm set, call light within reach.  [place] OT recommendation remains appropriate.   ? ?Recommendations for follow up therapy are one component of a multi-disciplinary discharge planning process, led by the attending physician.  Recommendations may be updated based on patient status, additional functional criteria and insurance authorization. ?   ?Follow Up Recommendations ? Home health OT  ?  ?Assistance Recommended at Discharge Frequent or constant Supervision/Assistance  ?Patient can return home with the following ? A little help with walking and/or transfers;Assistance with cooking/housework;A little help with  bathing/dressing/bathroom;Help with stairs or ramp for entrance;Direct supervision/assist for medications management;Assist for transportation ?  ?Equipment Recommendations ? BSC/3in1  ?  ?   ? ?  ?Precautions / Restrictions Precautions ?Precautions: Fall ?Restrictions ?Weight Bearing Restrictions: No  ? ? ?  ? ?Mobility Bed Mobility ?Overal bed mobility: Needs Assistance ?Bed Mobility: Supine to Sit ?  ?  ?Supine to sit: Min guard, HOB elevated ?  ?  ?  ?Patient Response: Cooperative ? ?Transfers ?Overall transfer level: Needs assistance ?Equipment used: Rolling walker (2 wheels) ?Transfers: Sit to/from Stand ?Sit to Stand: Supervision ?  ?  ?  ?  ?  ?  ?  ?  ?Balance Overall balance assessment: Needs assistance, History of Falls ?Sitting-balance support: Feet supported, Bilateral upper extremity supported ?Sitting balance-Leahy Scale: Good ?Sitting balance - Comments: maintained sitting balance with LB dressing tasks while seated at toilet ?  ?Standing balance support: Single extremity supported ?Standing balance-Leahy Scale: Fair ?Standing balance comment: unilateral support to perform grooming tasks standing at sink ?  ?  ?  ?  ?  ?  ?  ?  ?  ?  ?  ?   ? ?ADL either performed or assessed with clinical judgement  ? ?ADL Overall ADL's : Needs assistance/impaired ?  ?  ?Grooming: Oral care;Brushing hair;Supervision/safety;Standing ?Grooming Details (indicate cue type and reason): unilateral support to stand at sink; min vc for walker positioning to enable standing as close to sink as possible ?  ?  ?  ?  ?  ?  ?Lower Body Dressing: Minimal assistance;Sitting/lateral leans ?Lower Body Dressing Details (indicate cue type  and reason): crossed legs with difficulty to thread legs through pants and don socks ?Toilet Transfer: Rolling walker (2 wheels);Grab bars;Minimal assistance ?Toilet Transfer Details (indicate cue type and reason): OT placed 3in1 over toilet at end of session d/t increased effort for sit to stand  from toilet without 3in1 ?Toileting- Water quality scientist and Hygiene: Min guard;Sit to/from stand ?Toileting - Clothing Manipulation Details (indicate cue type and reason): hiked brief and pajama bottoms in standing ?  ?  ?Functional mobility during ADLs: Min guard;Rolling walker (2 wheels) ?General ADL Comments: Pt amb from bed to bathroom for toileting with min guard and RW, walked back to bedside sink for grooming tasks and back around bed to sit up in chair at end of session. ?  ? ?Extremity/Trunk Assessment Upper Extremity Assessment ?Upper Extremity Assessment: Generalized weakness ?  ?Lower Extremity Assessment ?Lower Extremity Assessment: Generalized weakness;Defer to PT evaluation ?  ?  ?  ? ?Vision Patient Visual Report: No change from baseline ?  ?  ?   ?  ?   ?  ? ?Cognition Arousal/Alertness: Awake/alert ?Behavior During Therapy: Flat affect ?Overall Cognitive Status: Within Functional Limits for tasks assessed ?  ?  ?  ?  ?  ?  ?  ?  ?  ?  ?  ?  ?  ?  ?  ?  ?General Comments: motivated to get better ?  ?  ?   ?   ? ?  ?   ? ? ?  ?General Comments    ? ? ?Pertinent Vitals/ Pain       Pain Assessment ?Pain Assessment: 0-10 ?Pain Score: 10-Worst pain ever (Pt reports headache 10/10 pain; OT explained pain scale and pt did not change her number, though pt had no tears, facial grimacing, and tolerated session without any additional c/o pain.) ?Pain Descriptors / Indicators: Aching ?Pain Intervention(s): Monitored during session, RN gave pain meds during session ? ?   ?  ?  ?  ?  ?  ?  ?  ?  ?  ?  ?  ?  ?  ?  ?  ?  ?  ?  ? ?  ?    ?  ?  ?  ?   ? ?Frequency ? Min 2X/week  ? ? ? ? ?  ?Progress Toward Goals ? ?OT Goals(current goals can now be found in the care plan section) ? Progress towards OT goals: Progressing toward goals ? ?Acute Rehab OT Goals ?Patient Stated Goal: To return home ?OT Goal Formulation: With patient ?Time For Goal Achievement: 06/07/21 ?Potential to Achieve Goals: Good  ?Plan Discharge  plan remains appropriate   ? ? ? ? ?   ?  ?  ?  ?  ? ?  ?AM-PAC OT "6 Clicks" Daily Activity     ?Outcome Measure ? ? Help from another person eating meals?: A Little ?Help from another person taking care of personal grooming?: A Little ?Help from another person toileting, which includes using toliet, bedpan, or urinal?: A Little ?Help from another person bathing (including washing, rinsing, drying)?: A Lot ?Help from another person to put on and taking off regular upper body clothing?: None ?Help from another person to put on and taking off regular lower body clothing?: A Little ?6 Click Score: 18 ? ?  ?End of Session Equipment Utilized During Treatment: Gait belt;Rolling walker (2 wheels) ? ?OT Visit Diagnosis: Muscle weakness (generalized) (M62.81) ?  ?Activity Tolerance Patient tolerated treatment well ?  ?  Patient Left in chair;with call bell/phone within reach;with chair alarm set ?  ?Nurse Communication Mobility status ?  ? ?   ? ?Time: [phone] ?OT Time Calculation (min): 26 min ? ?Charges: OT General Charges ?$OT Visit: 1 Visit ?OT Treatments ?$Self Care/Home Management : 23-37 mins ? ?Leta Speller, MS, OTR/L ? ? ?Darleene Cleaver ?05/26/2021, 12:34 PM ?

## 2021-05-26 NOTE — Care Management (Cosign Needed Addendum)
Patient will need a 3 n 1 due to having difficulty transfering to commode and location of restroom in patient's residence. ?

## 2021-05-27 ENCOUNTER — Telehealth: Payer: Self-pay

## 2021-05-27 NOTE — Telephone Encounter (Signed)
Transition Care Management Follow-up Telephone Call ?Date of discharge and from where: TCM DC [facility] 05-26-21 Dx: acute metabolic encephalopathy ?How have you been since you were released from the hospital? Doing ok  ?Any questions or concerns? No ? ?Items Reviewed: ?Did the pt receive and understand the discharge instructions provided? Yes  ?Medications obtained and verified? Yes  ?Other? No  ?Any new allergies since your discharge? No  ?Dietary orders reviewed? Yes ?Do you have support at home? Yes  ? ?Home Care and Equipment/Supplies: ?Were home health services ordered? no ?Pt has [facility]   ?Has the agency set up a time to come to the patient's home? not applicable ?Were any new equipment or medical supplies ordered?  No ?What is the name of the medical supply agency? na ?Were you able to get the supplies/equipment? not applicable ?Do you have any questions related to the use of the equipment or supplies? No ? ?Functional Questionnaire: (I = Independent and D = Dependent) ?ADLs: I ? ?Bathing/Dressing- I ? ?Meal Prep- I ? ?Eating- I ? ?Maintaining continence- I ? ?Transferring/Ambulation- I ? ?Managing Meds- I ? ?Follow up appointments reviewed: ? ?PCP Hospital f/u appt confirmed? Yes  Scheduled to see Mardene Speak PA-C on 06-02-21 @ 240pm. ?[facility] f/u appt confirmed? Yes  Scheduled to see Dr [name] on 06-20-21 @ 1140am. ?Are transportation arrangements needed? No  ?If their condition worsens, is the pt aware to call PCP or go to the Emergency Dept.? Yes ?Was the patient provided with contact information for the PCP's office or ED? Yes ?Was to pt encouraged to call back with questions or concerns? Yes  ?

## 2021-05-28 LAB — CULTURE, BLOOD (ROUTINE X 2)
Culture: NO GROWTH
Culture: NO GROWTH
Special Requests: ADEQUATE
Special Requests: ADEQUATE

## 2021-05-30 ENCOUNTER — Inpatient Hospital Stay: Payer: Medicare Other | Admitting: Physician Assistant

## 2021-05-30 ENCOUNTER — Telehealth: Payer: Self-pay

## 2021-05-30 NOTE — Telephone Encounter (Signed)
Copied from [place] [id number]. Topic: General - Other ?>> May 30, 2021 12:32 PM Tessa Lerner A wrote: ?Reason for CRM: Laken with [facility] has called to share that a transition of care assessment has been completed for the patient  ? ?The pateints discharge paperwork and medications were reviewed  ? ?The patient had no additional concerns at this time ? ?Please contact further if needed ?

## 2021-05-30 NOTE — Progress Notes (Deleted)
I,[name],acting as a Neurosurgeon for [employer], PA-C.,have documented all relevant documentation on the behalf of Kendra Lat, PA-C,as directed by  [employer], PA-C while in the presence of [employer], PA-C.  Established patient visit   Patient: [name]   DOB: [birth year]   51 y.o. Female  MRN: [id number] Visit Date: 06/02/2021  Today's healthcare provider: [name], PA-C   No chief complaint on file.  Subjective    HPI  Follow up Hospitalization  Patient was admitted to [facility] on 05/23/21 and discharged on 05-26-21 and again admitted again on 06-01-21 for chest pain.   She was treated for fall and AMS. Found to have hypoglycemia. Treatment for this included: see chart. Telephone follow up was done on 05-27-21 She reports {excellent/good/fair:19992} compliance with treatment. She reports this condition is {resolved/improved/worsened:23923}.  Patient was admiited to [facility] on And discharged on 05/31/21 Assessment and Plan: Chest pain in patient with underlying coronary artery disease -Troponins 22, 19, not consistent with ACS.   --cardiology consulted, deemed likely MSK etiology Plan: --d/c nitro gtt --d/c heparin gtt -Increase Ranexa to 1000 mg twice daily  -No plans for invasive work-up.      Adrenal insufficiency (HCC) Recent diagnosis during hospitalization a week ago Continue hydrocortisone 20 mg twice daily   History of diabetes mellitus --not currently active.  A1c has been wnl for the past year, and no hypoglycemics on home med list. --d/c BG checks and SSI   CAD S/P percutaneous coronary angioplasty --multiple PCI's, last PCI 05/12/2021 -Continue aspirin, Plavix, Crestor   Chronic diastolic CHF (congestive heart failure) (HCC) Euvolemic   Seizure disorder (HCC) Continue Vimpat and Keppra and Neurontin   Stage 3b chronic kidney disease (HCC) At baseline   Hypothyroidism Continue levothyroxine   Chronic systolic CHF  (congestive heart failure) (HCC) Euvolemic.  Continue Coreg   Essential hypertension --home coreg switched to Lopressor by cardio --cont Imdur   Depression with anxiety Continue Remeron and Xanax      Assessment and Plan: * Acute metabolic encephalopathy Negative EEG and MRI brain.  Most likely due to hypoglycemia.  Condition had improved.   Adrenal insufficiency (HCC) Patient had cortisol level drawn at 9 AM yesterday, was 4.6.  With the significant hypoglycemia, this level of cortisol is low.  We will continue hydrocortisone at 20 mg twice a day for significant hypoglycemia   Chronic diastolic CHF (congestive heart failure) (HCC) Stable.   Elevated lactic acid level No significant source of infection.  Patient is not on metformin.  Most likely source is liver, patient most likely has nonalcoholic steatohepatitis.    Seizure (HCC) EEG did not show any seizure activity   Chronic kidney disease, stage 3b (HCC) Renal function has been stable.  Patient does not have any urinary tension   Type II diabetes mellitus with renal manifestations (HCC) Diet-controlled diabetes.  Recent A1c 5.1, well controlled.  Patient currently does not take any diabetic medication.   Hypoglycemia Patient has a borderline cortisol level, started on hydrocortisone 20 mg twice a day. Patient also had elevated insulin level at 165 and a C-peptide of 19.3.  Significant elevated. But since starting the hydrocortisone, glucose has been running high, did not develop any additional hypoglycemia.  At this point, patient be referred to endocrinologist, will try to call to set up appointment to be seen in 1 to 2 weeks with Dr. Nadyne Coombes. MRI of the abdomen did not show any mass in the  liver or pancreas     Fall Patient has been having for at home.  We will send PT as outpatient   CAD (coronary artery disease) Continue aspirin, Plavix, Crestor, imdur, Ranexa   Hypothyroidism TSH normal 2.213  today Resume home dose Synthroid.   Hyperlipidemia - Crestor   Essential hypertension Resume home regimen.   Depression with anxiety Continue home medications       ----------------------------------------------------------------------------------------- - Medications: Outpatient Medications Prior to Visit  Medication Sig   AIMOVIG 140 MG/ML SOAJ Inject 140 mg into the skin every 28 (twenty-eight) days.   ALPRAZolam (XANAX) 1 MG tablet Take 1 tablet by mouth twice daily   amLODipine (NORVASC) 10 MG tablet Take 0.5 tablets (5 mg total) by mouth daily.   aspirin EC 81 MG EC tablet Take 1 tablet (81 mg total) by mouth daily. Swallow whole.   Baclofen 5 MG TABS Take 5 mg by mouth 2 (two) times daily as needed (muscle spasms).   carvedilol (COREG) 12.5 MG tablet Take 12.5 mg by mouth 2 (two) times daily.   clopidogrel (PLAVIX) 75 MG tablet Take 1 tablet (75 mg total) by mouth daily.   desvenlafaxine (PRISTIQ) 50 MG 24 hr tablet Take 1 tablet by mouth every morning   diclofenac Sodium (VOLTAREN) 1 % GEL Apply 2 g topically 4 (four) times daily as needed (joint paim).   diphenoxylate-atropine (LOMOTIL) 2.5-0.025 MG tablet Take 1 tablet by mouth 4 (four) times daily as needed for diarrhea or loose stools.   estradiol (ESTRACE) 0.1 MG/GM vaginal cream Place 1 Applicatorful vaginally 3 (three) times a week. Estrogen Cream Instruction Discard applicator Apply pea sized amount to tip of finger to urethra before bed. Wash hands well after application. Use Monday, Wednesday and Friday   furosemide (LASIX) 20 MG tablet Take 20 mg by mouth daily.   gabapentin (NEURONTIN) 300 MG capsule Take 2 capsules by mouth twice daily   hydrocortisone (CORTEF) 20 MG tablet Take 1 tablet (20 mg total) by mouth 2 (two) times daily.   isosorbide mononitrate (IMDUR) 30 MG 24 hr tablet Take 0.5 tablets (15 mg total) by mouth daily.   lacosamide (VIMPAT) 200 MG TABS tablet Take 1 tablet (200 mg total) by mouth 2 (two)  times daily.   levETIRAcetam (KEPPRA) 1000 MG tablet Take 1 tablet (1,000 mg total) by mouth 2 (two) times daily.   levothyroxine (SYNTHROID) 25 MCG tablet Take 1 tablet (25 mcg total) by mouth daily before breakfast.   metoprolol tartrate (LOPRESSOR) 25 MG tablet Take 1 tablet (25 mg total) by mouth 2 (two) times daily. (Patient not taking: Reported on 05/27/2021)   mirabegron ER (MYRBETRIQ) 50 MG TB24 tablet Take 1 tablet (50 mg total) by mouth daily.   mirtazapine (REMERON) 7.5 MG tablet Take 1 tablet (7.5 mg total) by mouth at bedtime.   nitrofurantoin (MACRODANTIN) 100 MG capsule Take 100 mg by mouth 2 (two) times daily.   nitroGLYCERIN (NITROSTAT) 0.4 MG SL tablet Dissolve 1 tablet under tongue every , up to 3 doses, for chest pain. Max 3 doses in . (Patient taking differently: Place 0.4 mg under the tongue every 5 (five) minutes x 3 doses as needed for chest pain.)   ofloxacin (OCUFLOX) 0.3 % ophthalmic solution Place 1 drop into the right eye 3 (three) times daily.   pantoprazole (PROTONIX) 40 MG tablet Take 1 tablet by mouth twice daily   Polyethyl Glycol-Propyl Glycol (SYSTANE OP) Place 1 drop into both eyes daily as needed (dry  eyes).   prednisoLONE acetate (PRED FORTE) 1 % ophthalmic suspension Place 1 drop into the right eye 3 (three) times daily.   promethazine (PHENERGAN) 25 MG tablet Take 1 tablet by mouth every 6 hours as needed for nausea or vomiting.   ranolazine (RANEXA) 500 MG 12 hr tablet Take 1 tablet (500 mg total) by mouth 2 (two) times daily. (Patient not taking: Reported on 05/27/2021)   Rimegepant Sulfate (NURTEC) 75 MG TBDP Take 75 mg by mouth as needed. (Patient taking differently: Take 75 mg by mouth as needed (migraine).)   rosuvastatin (CRESTOR) 40 MG tablet Take 1 tablet (40 mg total) by mouth daily at 6 PM.   No facility-administered medications prior to visit.    Review of Systems  {Labs  Heme  Chem  Endocrine  Serology  Results Review  (optional):23779}   Objective    There were no vitals taken for this visit. {Show previous vital signs (optional):23777}  Physical Exam  ***  No results found for any visits on 06/02/21.  Assessment & Plan     ***  No follow-ups on file.      {provider attestation***:1}   [name], Cordelia Poche  [facility] [phone] (phone) [phone] (fax)  [facility]

## 2021-06-01 ENCOUNTER — Observation Stay
Admission: EM | Admit: 2021-06-01 | Discharge: 2021-06-02 | Disposition: A | Discharge status: Home / Self Care | Payer: Medicare Other | Attending: Hospitalist | Admitting: Hospitalist

## 2021-06-01 ENCOUNTER — Emergency Department: Payer: Medicare Other

## 2021-06-01 ENCOUNTER — Other Ambulatory Visit: Payer: Self-pay

## 2021-06-01 ENCOUNTER — Observation Stay (HOSPITAL_BASED_OUTPATIENT_CLINIC_OR_DEPARTMENT_OTHER)
Admit: 2021-06-01 | Discharge: 2021-06-01 | Disposition: A | Discharge status: Home / Self Care | Payer: Medicare Other | Attending: Cardiology | Admitting: Cardiology

## 2021-06-01 DIAGNOSIS — E271 Primary adrenocortical insufficiency: Secondary | ICD-10-CM | POA: Insufficient documentation

## 2021-06-01 DIAGNOSIS — E2740 Unspecified adrenocortical insufficiency: Secondary | ICD-10-CM | POA: Diagnosis present

## 2021-06-01 DIAGNOSIS — I5032 Chronic diastolic (congestive) heart failure: Secondary | ICD-10-CM | POA: Diagnosis not present

## 2021-06-01 DIAGNOSIS — Z85828 Personal history of other malignant neoplasm of skin: Secondary | ICD-10-CM | POA: Insufficient documentation

## 2021-06-01 DIAGNOSIS — Z8673 Personal history of transient ischemic attack (TIA), and cerebral infarction without residual deficits: Secondary | ICD-10-CM | POA: Diagnosis not present

## 2021-06-01 DIAGNOSIS — Z7982 Long term (current) use of aspirin: Secondary | ICD-10-CM | POA: Insufficient documentation

## 2021-06-01 DIAGNOSIS — R079 Chest pain, unspecified: Secondary | ICD-10-CM

## 2021-06-01 DIAGNOSIS — E1165 Type 2 diabetes mellitus with hyperglycemia: Secondary | ICD-10-CM | POA: Diagnosis not present

## 2021-06-01 DIAGNOSIS — Z955 Presence of coronary angioplasty implant and graft: Secondary | ICD-10-CM | POA: Insufficient documentation

## 2021-06-01 DIAGNOSIS — R0789 Other chest pain: Secondary | ICD-10-CM | POA: Diagnosis present

## 2021-06-01 DIAGNOSIS — N1832 Chronic kidney disease, stage 3b: Secondary | ICD-10-CM | POA: Diagnosis not present

## 2021-06-01 DIAGNOSIS — I25110 Atherosclerotic heart disease of native coronary artery with unstable angina pectoris: Principal | ICD-10-CM | POA: Insufficient documentation

## 2021-06-01 DIAGNOSIS — I4891 Unspecified atrial fibrillation: Secondary | ICD-10-CM | POA: Insufficient documentation

## 2021-06-01 DIAGNOSIS — R072 Precordial pain: Secondary | ICD-10-CM | POA: Diagnosis not present

## 2021-06-01 DIAGNOSIS — E039 Hypothyroidism, unspecified: Secondary | ICD-10-CM | POA: Diagnosis not present

## 2021-06-01 DIAGNOSIS — Z87891 Personal history of nicotine dependence: Secondary | ICD-10-CM | POA: Diagnosis not present

## 2021-06-01 DIAGNOSIS — I202 Refractory angina pectoris: Secondary | ICD-10-CM | POA: Diagnosis not present

## 2021-06-01 DIAGNOSIS — I2510 Atherosclerotic heart disease of native coronary artery without angina pectoris: Secondary | ICD-10-CM

## 2021-06-01 DIAGNOSIS — Z853 Personal history of malignant neoplasm of breast: Secondary | ICD-10-CM | POA: Diagnosis not present

## 2021-06-01 DIAGNOSIS — Z79899 Other long term (current) drug therapy: Secondary | ICD-10-CM | POA: Diagnosis not present

## 2021-06-01 DIAGNOSIS — I10 Essential (primary) hypertension: Secondary | ICD-10-CM | POA: Diagnosis not present

## 2021-06-01 DIAGNOSIS — I130 Hypertensive heart and chronic kidney disease with heart failure and stage 1 through stage 4 chronic kidney disease, or unspecified chronic kidney disease: Secondary | ICD-10-CM | POA: Insufficient documentation

## 2021-06-01 DIAGNOSIS — I200 Unstable angina: Secondary | ICD-10-CM

## 2021-06-01 DIAGNOSIS — Z8639 Personal history of other endocrine, nutritional and metabolic disease: Secondary | ICD-10-CM

## 2021-06-01 DIAGNOSIS — G40909 Epilepsy, unspecified, not intractable, without status epilepticus: Secondary | ICD-10-CM

## 2021-06-01 DIAGNOSIS — F418 Other specified anxiety disorders: Secondary | ICD-10-CM | POA: Diagnosis present

## 2021-06-01 LAB — CBG MONITORING, ED: Glucose-Capillary: 75 mg/dL (ref 70–99)

## 2021-06-01 LAB — BASIC METABOLIC PANEL
Anion gap: 10 (ref 5–15)
BUN: 38 mg/dL — ABNORMAL HIGH (ref 6–20)
CO2: 21 mmol/L — ABNORMAL LOW (ref 22–32)
Calcium: 7.8 mg/dL — ABNORMAL LOW (ref 8.9–10.3)
Chloride: 110 mmol/L (ref 98–111)
Creatinine, Ser: 1.66 mg/dL — ABNORMAL HIGH (ref 0.44–1.00)
GFR, Estimated: 37 mL/min — ABNORMAL LOW (ref >60)
Glucose, Bld: 220 mg/dL — ABNORMAL HIGH (ref 70–99)
Potassium: 4 mmol/L (ref 3.5–5.1)
Sodium: 141 mmol/L (ref 135–145)

## 2021-06-01 LAB — CBC
HCT: 32.7 % — ABNORMAL LOW (ref 36.0–46.0)
Hemoglobin: 10 g/dL — ABNORMAL LOW (ref 12.0–15.0)
MCH: 25.9 pg — ABNORMAL LOW (ref 26.0–34.0)
MCHC: 30.6 g/dL (ref 30.0–36.0)
MCV: 84.7 fL (ref 80.0–100.0)
Platelets: 140 10*3/uL — ABNORMAL LOW (ref 150–400)
RBC: 3.86 MIL/uL — ABNORMAL LOW (ref 3.87–5.11)
RDW: 13.5 % (ref 11.5–15.5)
WBC: 9.2 10*3/uL (ref 4.0–10.5)
nRBC: 0 % (ref 0.0–0.2)

## 2021-06-01 LAB — ECHOCARDIOGRAM COMPLETE
AR max vel: 1.13 cm2
AV Area VTI: 1.06 cm2
AV Area mean vel: 1.11 cm2
AV Mean grad: 5 mmHg
AV Peak grad: 8.8 mmHg
Ao pk vel: 1.48 m/s
Area-P 1/2: 2.52 cm2
Calc EF: 59.1 %
Height: 63 in
S' Lateral: 3.1 cm
Single Plane A2C EF: 63.6 %
Single Plane A4C EF: 53.1 %
Weight: 2380.97 oz

## 2021-06-01 LAB — APTT: aPTT: 30 seconds (ref 24–36)

## 2021-06-01 LAB — PROTIME-INR
INR: 1.1 (ref 0.8–1.2)
Prothrombin Time: 14 seconds (ref 11.4–15.2)

## 2021-06-01 LAB — TROPONIN I (HIGH SENSITIVITY)
Troponin I (High Sensitivity): 22 ng/L — ABNORMAL HIGH (ref <18)
Troponin I (High Sensitivity): 19 ng/L — ABNORMAL HIGH (ref <18)

## 2021-06-01 LAB — GLUCOSE, CAPILLARY: Glucose-Capillary: 116 mg/dL — ABNORMAL HIGH (ref 70–99)

## 2021-06-01 MED ORDER — MIRABEGRON ER 50 MG PO TB24
50.0000 mg | ORAL_TABLET | Freq: Every day | ORAL | Status: DC
  Administered 2021-06-01 – 2021-06-02 (×2): 50 mg via ORAL
  Filled 2021-06-01 (×2): qty 1

## 2021-06-01 MED ORDER — HYDROCORTISONE 10 MG PO TABS
20.0000 mg | ORAL_TABLET | Freq: Two times a day (BID) | ORAL | Status: DC
  Administered 2021-06-01 – 2021-06-02 (×3): 20 mg via ORAL
  Filled 2021-06-01 (×3): qty 2

## 2021-06-01 MED ORDER — OFLOXACIN 0.3 % OP SOLN: 1.0000 [drp] | Freq: Three times a day (TID) | OPHTHALMIC | Status: DC

## 2021-06-01 MED ORDER — MIRTAZAPINE 15 MG PO TABS
7.5000 mg | ORAL_TABLET | Freq: Every day | ORAL | Status: DC
  Administered 2021-06-01: 7.5 mg via ORAL
  Filled 2021-06-01 (×2): qty 1

## 2021-06-01 MED ORDER — ASPIRIN EC 81 MG PO TBEC
81.0000 mg | DELAYED_RELEASE_TABLET | Freq: Every day | ORAL | Status: DC
  Administered 2021-06-02: 81 mg via ORAL
  Filled 2021-06-01: qty 1

## 2021-06-01 MED ORDER — ALPRAZOLAM 0.5 MG PO TABS
1.0000 mg | ORAL_TABLET | Freq: Two times a day (BID) | ORAL | Status: DC | PRN
  Administered 2021-06-01: 1 mg via ORAL
  Filled 2021-06-01: qty 2

## 2021-06-01 MED ORDER — PERFLUTREN LIPID MICROSPHERE
1.0000 mL | INTRAVENOUS | Status: AC | PRN
  Administered 2021-06-01: 3 mL via INTRAVENOUS
  Filled 2021-06-01: qty 10

## 2021-06-01 MED ORDER — GABAPENTIN 300 MG PO CAPS
600.0000 mg | ORAL_CAPSULE | Freq: Two times a day (BID) | ORAL | Status: DC
  Administered 2021-06-01 – 2021-06-02 (×3): 600 mg via ORAL
  Filled 2021-06-01 (×3): qty 2

## 2021-06-01 MED ORDER — LACOSAMIDE 50 MG PO TABS
200.0000 mg | ORAL_TABLET | Freq: Two times a day (BID) | ORAL | Status: DC
  Administered 2021-06-01 – 2021-06-02 (×3): 200 mg via ORAL
  Filled 2021-06-01 (×3): qty 4

## 2021-06-01 MED ORDER — ONDANSETRON HCL 4 MG/2ML IJ SOLN
4.0000 mg | Freq: Four times a day (QID) | INTRAMUSCULAR | Status: DC | PRN
  Administered 2021-06-01: 4 mg via INTRAVENOUS
  Filled 2021-06-01: qty 2

## 2021-06-01 MED ORDER — BACLOFEN 10 MG PO TABS
5.0000 mg | ORAL_TABLET | Freq: Two times a day (BID) | ORAL | Status: DC | PRN
  Filled 2021-06-01: qty 0.5

## 2021-06-01 MED ORDER — CLOPIDOGREL BISULFATE 75 MG PO TABS
75.0000 mg | ORAL_TABLET | Freq: Every day | ORAL | Status: DC
  Administered 2021-06-01 – 2021-06-02 (×2): 75 mg via ORAL
  Filled 2021-06-01 (×2): qty 1

## 2021-06-01 MED ORDER — CARVEDILOL 6.25 MG PO TABS
12.5000 mg | ORAL_TABLET | Freq: Two times a day (BID) | ORAL | Status: DC
  Filled 2021-06-01: qty 2

## 2021-06-01 MED ORDER — MIRTAZAPINE 15 MG PO TABS
7.5000 mg | ORAL_TABLET | Freq: Every day | ORAL | Status: DC
  Administered 2021-06-01: 7.5 mg via ORAL
  Filled 2021-06-01: qty 1

## 2021-06-01 MED ORDER — INSULIN ASPART 100 UNIT/ML IJ SOLN: 0.0000 [IU] | Freq: Every day | INTRAMUSCULAR | Status: DC

## 2021-06-01 MED ORDER — ACETAMINOPHEN 325 MG PO TABS: 650.0000 mg | ORAL_TABLET | ORAL | Status: DC | PRN

## 2021-06-01 MED ORDER — LEVOTHYROXINE SODIUM 25 MCG PO TABS
25.0000 ug | ORAL_TABLET | Freq: Every day | ORAL | Status: DC
  Administered 2021-06-01 – 2021-06-02 (×2): 25 ug via ORAL
  Filled 2021-06-01 (×2): qty 1

## 2021-06-01 MED ORDER — LEVETIRACETAM 500 MG PO TABS
1000.0000 mg | ORAL_TABLET | Freq: Two times a day (BID) | ORAL | Status: DC
  Administered 2021-06-01 – 2021-06-02 (×3): 1000 mg via ORAL
  Filled 2021-06-01 (×3): qty 2

## 2021-06-01 MED ORDER — AMLODIPINE BESYLATE 5 MG PO TABS
5.0000 mg | ORAL_TABLET | Freq: Every day | ORAL | Status: DC
  Filled 2021-06-01: qty 1

## 2021-06-01 MED ORDER — PANTOPRAZOLE SODIUM 40 MG PO TBEC
40.0000 mg | DELAYED_RELEASE_TABLET | Freq: Two times a day (BID) | ORAL | Status: DC
  Administered 2021-06-01 – 2021-06-02 (×3): 40 mg via ORAL
  Filled 2021-06-01 (×3): qty 1

## 2021-06-01 MED ORDER — IOHEXOL 350 MG/ML SOLN
80.0000 mL | Freq: Once | INTRAVENOUS | Status: AC | PRN
Start: 2021-06-01 — End: 2021-06-01
  Administered 2021-06-01: 80 mL via INTRAVENOUS

## 2021-06-01 MED ORDER — ONDANSETRON HCL 4 MG/2ML IJ SOLN
4.0000 mg | Freq: Once | INTRAMUSCULAR | Status: AC
  Administered 2021-06-01: 4 mg via INTRAVENOUS
  Filled 2021-06-01: qty 2

## 2021-06-01 MED ORDER — METOPROLOL TARTRATE 25 MG PO TABS
25.0000 mg | ORAL_TABLET | Freq: Two times a day (BID) | ORAL | Status: DC
  Administered 2021-06-01 – 2021-06-02 (×3): 25 mg via ORAL
  Filled 2021-06-01 (×3): qty 1

## 2021-06-01 MED ORDER — ISOSORBIDE MONONITRATE ER 60 MG PO TB24
60.0000 mg | ORAL_TABLET | Freq: Every day | ORAL | Status: DC
  Administered 2021-06-01 – 2021-06-02 (×2): 60 mg via ORAL
  Filled 2021-06-01 (×2): qty 1

## 2021-06-01 MED ORDER — PROMETHAZINE HCL 25 MG PO TABS
25.0000 mg | ORAL_TABLET | Freq: Four times a day (QID) | ORAL | Status: DC | PRN
  Administered 2021-06-01: 25 mg via ORAL
  Filled 2021-06-01 (×2): qty 1

## 2021-06-01 MED ORDER — NITROGLYCERIN IN D5W 200-5 MCG/ML-% IV SOLN
0.0000 ug/min | INTRAVENOUS | Status: DC
  Administered 2021-06-01: 5 ug/min via INTRAVENOUS
  Filled 2021-06-01: qty 250

## 2021-06-01 MED ORDER — MORPHINE SULFATE (PF) 4 MG/ML IV SOLN
4.0000 mg | Freq: Once | INTRAVENOUS | Status: AC
  Administered 2021-06-01: 4 mg via INTRAVENOUS
  Filled 2021-06-01: qty 1

## 2021-06-01 MED ORDER — HYDROCORTISONE 10 MG PO TABS: 20.0000 mg | ORAL_TABLET | Freq: Two times a day (BID) | ORAL | Status: DC

## 2021-06-01 MED ORDER — HEPARIN BOLUS VIA INFUSION
4000.0000 [IU] | Freq: Once | INTRAVENOUS | Status: AC
  Administered 2021-06-01: 4000 [IU] via INTRAVENOUS
  Filled 2021-06-01: qty 4000

## 2021-06-01 MED ORDER — PREDNISOLONE ACETATE 1 % OP SUSP
1.0000 [drp] | Freq: Every day | OPHTHALMIC | Status: DC
  Administered 2021-06-01 – 2021-06-02 (×2): 1 [drp] via OPHTHALMIC
  Filled 2021-06-01: qty 1

## 2021-06-01 MED ORDER — ATORVASTATIN CALCIUM 80 MG PO TABS
80.0000 mg | ORAL_TABLET | Freq: Every day | ORAL | Status: DC
  Administered 2021-06-01: 80 mg via ORAL
  Filled 2021-06-01: qty 4
  Filled 2021-06-01: qty 1
  Filled 2021-06-01: qty 4

## 2021-06-01 MED ORDER — RANOLAZINE ER 500 MG PO TB12
1000.0000 mg | ORAL_TABLET | Freq: Two times a day (BID) | ORAL | Status: DC
  Administered 2021-06-01 – 2021-06-02 (×3): 1000 mg via ORAL
  Filled 2021-06-01 (×3): qty 2

## 2021-06-01 MED ORDER — SODIUM CHLORIDE 0.9 % IV SOLN: INTRAVENOUS | Status: DC

## 2021-06-01 MED ORDER — INSULIN ASPART 100 UNIT/ML IJ SOLN: 0.0000 [IU] | Freq: Three times a day (TID) | INTRAMUSCULAR | Status: DC

## 2021-06-01 MED ORDER — PREDNISOLONE ACETATE 1 % OP SUSP: 1.0000 [drp] | Freq: Three times a day (TID) | OPHTHALMIC | Status: DC

## 2021-06-01 MED ORDER — HEPARIN (PORCINE) 25000 UT/250ML-% IV SOLN
800.0000 [IU]/h | INTRAVENOUS | Status: DC
  Administered 2021-06-01: 800 [IU]/h via INTRAVENOUS
  Filled 2021-06-01: qty 250

## 2021-06-01 MED ORDER — OFLOXACIN 0.3 % OP SOLN
1.0000 [drp] | Freq: Three times a day (TID) | OPHTHALMIC | Status: DC
  Administered 2021-06-01 – 2021-06-02 (×3): 1 [drp] via OPHTHALMIC
  Filled 2021-06-01: qty 5

## 2021-06-01 NOTE — Consult Note (Signed)
?Cardiology Consultation:  ? ?Patient ID: [name] ?MRN: [id number]; DOB: 09-11-1970 ? ?Admit date: 06/01/2021 ?Date of Consult: 06/01/2021 ? ?PCP:  [name], MD ?  ?[facility] Providers ?Cardiologist:  Ida Rogue, MD      ? ? ?Patient Profile:  ? ?[name] is a 51 y.o. female with a hx of CAD/PCI, who is being seen 06/01/2021 for the evaluation of chest pain at the request of Dr. Damita Dunnings. ? ?History of Present Illness:  ? ?Ms. [place] [name] is a 51 year old female with history of CAD, multiple PCIs, last pci to distal RCA 05/12/2021, hypertension, diabetes, CVA, orthostasis, gastric bypass, seizure disorder, hyperlipidemia presenting with chest pain. ? ?Symptoms began 2 days ago when patient was at home.  Describes sharp pain under her left breast, radiating to her back.  She subsequently developed chest pressure which did not resolve with taking nitroglycerin prompting her to come to the ED. ? ?States being compliant with all her medications.  Recently started on hydrocortisone due to adrenal insufficiency. ? ?Her medications palpitations were recently altered for possible orthostasis.  Carvedilol was stopped, Lopressor started.  Imdur was also reduced to 50 mg from 120 mg.  She states still taking carvedilol at home.  Stopped taking Norvasc as this drops her blood pressure too much. ? ?In the ED, EKG showed sinus bradycardia, LVH, possible old inferior MI, troponins 22, 19.  Systolic blood pressure 160F.  Started on nitroglycerin drip and heparin drip in the ED per ACS protocol. ? ? ?Past Medical History:  ?Diagnosis Date  ? Acute colitis 01/27/2017  ? Acute pyelonephritis   ? Acute upper GI bleed 01/25/2020  ? Anemia   ? iron deficiency anemia  ? Aortic arch aneurysm ([place])   ? BRCA negative 2014  ? CAD (coronary artery disease)   ? a. 08/2003 Cath: LAD 30-40-med Rx; b. 11/2014 PCI: LAD 11m(3.25x23 Xience Alpine DES); c. 06/2015 PCI: D1 (2.25x12 Resolute Integrity DES); d. 06/2017  PCI: Patent mLAD stent, D2 95 (PTCA); e. 09/2017 PCI: D2 99ost (CBA); d. 12/2017 Cath: LM nl, LAD 3325m80d (small), D1 40ost, D2 95ost, LCX 40p, RCA 40ost/p->Med rx for D2 given restenosis.  ? Closed nondisplaced intertrochanteric fracture of left femur (HCWaycross9/10/2018  ? Colitis 06/03/2015  ? Colon polyp   ? [facility]rebral vascular accident) [facility]  ? Left side weakness.   ? Degenerative tear of glenoid labrum of right shoulder 03/15/2017  ? Diabetes mellitus without complication (H[facility]erber  ? Family history of breast cancer   ? BRCA neg 2014  ? Femur fracture, left (HCHoll[facility]9/10/2018  ? Gastric ulcer 04/27/2011  ? Helicobacter pylori infection 11/23/2014  ? History of echocardiogram   ? a. 03/2017 Echo: EF 60-65%, no rwma; b. 02/2018 Echo: EF 60-65%, no rwma. Nl RV fxn. No cardiac source of emboli (admitted w/ stroke).  ? Hypertension   ? Iron deficiency anemia secondary to blood loss (chronic)   ? Malignant melanoma of skin of scalp (HCR[facility]  ? MI, acute, non ST segment elevation ([facility]af[facility]  ? Neuromuscular disorder ([facility]  ? S/P drug eluting coronary stent placement 06/04/2015  ? Sepsis (HCWinton2/14/2019  ? Sepsis secondary to UTI (HCDortches3/22/2022  ? Stroke ([facility]  ? a. 02/2018 MRI: 25m35mate acute/early subacute L medial frontal lobe inarct; b. 02/2018 MRA No large vessel occlusion or aneurysm. Mod to sev L P2 stenosis. thready L vertebral artery, diffusely dzs'd; c. 02/2018  Carotid U/S: <50% bilat ICA dzs.  ? Unspecified atrial fibrillation ([place]) 04/23/2020  ? ? ?Past Surgical History:  ?Procedure Laterality Date  ? APPENDECTOMY    ? BALLOON ENTEROSCOPY  02/06/2020  ? [place]  ? BIOPSY N/A 03/14/2020  ? Procedure: BIOPSY;  Surgeon: [name], MD;  Location: [place];  Service: Endoscopy;  Laterality: N/A;  ? CARDIAC CATHETERIZATION N/A 11/09/2014  ? Procedure: Coronary Angiography;  Surgeon: Minna Merritts, MD;  Location: [place] CV LAB;  Service: Cardiovascular;  Laterality: N/A;  ? CARDIAC CATHETERIZATION N/A 11/12/2014   ? Procedure: Coronary Stent Intervention;  Surgeon: Isaias Cowman, MD;  Location: [place] CV LAB;  Service: Cardiovascular;  Laterality: N/A;  ? CARDIAC CATHETERIZATION N/A 04/18/2015  ? Procedure: Left Heart Cath and Coronary Angiography;  Surgeon: Minna Merritts, MD;  Location: [place] CV LAB;  Service: Cardiovascular;  Laterality: N/A;  ? CARDIAC CATHETERIZATION Left 06/04/2015  ? Procedure: Left Heart Cath and Coronary Angiography;  Surgeon: Wellington Hampshire, MD;  Location: [place] CV LAB;  Service: Cardiovascular;  Laterality: Left;  ? CARDIAC CATHETERIZATION N/A 06/04/2015  ? Procedure: Coronary Stent Intervention;  Surgeon: Wellington Hampshire, MD;  Location: [place] CV LAB;  Service: Cardiovascular;  Laterality: N/A;  ? CESAREAN SECTION  2001  ? CHOLECYSTECTOMY N/A 11/18/2016  ? Procedure: LAPAROSCOPIC CHOLECYSTECTOMY WITH INTRAOPERATIVE CHOLANGIOGRAM;  Surgeon: Christene Lye, MD;  Location: [facility];  Service: General;  Laterality: N/A;  ? COLONOSCOPY WITH PROPOFOL N/A 04/27/2016  ? Procedure: COLONOSCOPY WITH PROPOFOL;  Surgeon: [name], MD;  Location: [place];  Service: Endoscopy;  Laterality: N/A;  ? COLONOSCOPY WITH PROPOFOL N/A 01/12/2018  ? Procedure: COLONOSCOPY WITH PROPOFOL;  Surgeon: Toledo, Benay Pike, MD;  Location: [facility] ENDOSCOPY;  Service: Endoscopy;  Laterality: N/A;  ? COLONOSCOPY WITH PROPOFOL N/A 03/14/2020  ? Procedure: COLONOSCOPY WITH PROPOFOL;  Surgeon: [name], MD;  Location: [place];  Service: Endoscopy;  Laterality: N/A;  Needs to be scheduled after 7:30 due to driver issues  ? CORONARY ANGIOPLASTY    ? CORONARY BALLOON ANGIOPLASTY N/A 06/29/2017  ? Procedure: CORONARY BALLOON ANGIOPLASTY;  Surgeon: Wellington Hampshire, MD;  Location: [place] CV LAB;  Service: Cardiovascular;  Laterality: N/A;  ? CORONARY BALLOON ANGIOPLASTY N/A 09/20/2017  ? Procedure: CORONARY BALLOON ANGIOPLASTY;  Surgeon: Wellington Hampshire, MD;   Location: [place] CV LAB;  Service: Cardiovascular;  Laterality: N/A;  ? CORONARY STENT INTERVENTION N/A 12/13/2019  ? Procedure: CORONARY STENT INTERVENTION;  Surgeon: Wellington Hampshire, MD;  Location: [place] CV LAB;  Service: Cardiovascular;  Laterality: N/A;  ? CORONARY STENT INTERVENTION N/A 05/12/2021  ? Procedure: CORONARY STENT INTERVENTION;  Surgeon: Nelva Bush, MD;  Location: [place] CV LAB;  Service: Cardiovascular;  Laterality: N/A;  ? DILATION AND CURETTAGE OF UTERUS    ? ESOPHAGOGASTRODUODENOSCOPY (EGD) WITH PROPOFOL N/A 09/14/2014  ? Procedure: ESOPHAGOGASTRODUODENOSCOPY (EGD) WITH PROPOFOL;  Surgeon: Josefine Class, MD;  Location[facility] ENDOSCOPY;  Service: Endoscopy;  Laterality: N/A;  ? ESOPHAGOGASTRODUODENOSCOPY (EGD) WITH PROPOFOL N/A 04/27/2016  ? Procedure: ESOPHAGOGASTRODUODENOSCOPY (EGD) WITH PROPOFOL;  Surgeon: [name], MD;  Location: [place];  Service: Endoscopy;  Laterality: N/A;  Diabetic - oral meds  ? ESOPHAGOGASTRODUODENOSCOPY (EGD) WITH PROPOFOL N/A 01/12/2018  ? Procedure: ESOPHAGOGASTRODUODENOSCOPY (EGD) WITH PROPOFOL;  Surgeon: Toledo, Benay Pike, MD;  Location: [facility] ENDOSCOPY;  Service: Endoscopy;  Laterality: N/A;  ? ESOPHAGOGASTRODUODENOSCOPY (EGD) WITH PROPOFOL N/A 04/11/2019  ? Procedure: ESOPHAGOGASTRODUODENOSCOPY (EGD)  WITH PROPOFOL;  Surgeon: [name], MD;  Location[facility] ENDOSCOPY;  Service: Endoscopy;  Laterality: N/A;  ? ESOPHAGOGASTRODUODENOSCOPY (EGD) WITH PROPOFOL N/A 03/08/2020  ? Procedure: ESOPHAGOGASTRODUODENOSCOPY (EGD) WITH PROPOFOL;  Surgeon: [name], MD;  Location[facility] ENDOSCOPY;  Service: Endoscopy;  Laterality: N/A;  ? GASTRIC BYPASS  09/2009  ? [facility]   ? INTRAMEDULLARY (IM) NAIL INTERTROCHANTERIC Left 10/13/2018  ? Procedure: INTRAMEDULLARY (IM) NAIL INTERTROCHANTRIC;  Surgeon: Leandrew Koyanagi, MD;  Location: [place];  Service: Orthopedics;  Laterality: Left;  ? Left Carotid to sublcavian artery  bypass w/ subclavian artery ligation    ? a. Performed @ [facility].  ? LEFT HEART CATH AND CORONARY ANGIOGRAPHY Left 06/29/2017  ? Procedure: LEFT HEART CATH AND CORONARY ANGIOGRAPHY;  Surgeon: Lattie Haw

## 2021-06-01 NOTE — Assessment & Plan Note (Signed)
At baseline 

## 2021-06-01 NOTE — ED Triage Notes (Signed)
Pt presents to ER c/o chest pain that started 4/28.  Pt states pain is in center of chest and radiates into lower back.  Pain is stabbing in nature.  States pain is intermittent in nature.  Pt does endorse associated sob with the pain.  Pt is otherwise A&O x4 at this time in NAD in triage.   ?

## 2021-06-01 NOTE — Assessment & Plan Note (Deleted)
-  Troponins 22, 19, not consistent with ACS.   ?--cardiology consulted, deemed likely MSK etiology ?--d/c nitro gtt ?--d/c heparin gtt ?

## 2021-06-01 NOTE — ED Notes (Signed)
RN to bedside to introduce self to pt. Pt sleeping. IV hep and NTG running.  ?

## 2021-06-01 NOTE — Assessment & Plan Note (Addendum)
Euvolemic.  Continue Coreg ?

## 2021-06-01 NOTE — ED Notes (Signed)
First rn note: per ems pt with central chest pain, stabbing and non radiating. Per ems began yesterday, worsening today and worse with movement. Per ems pt took her own ntg, pt also has pain in lower back with nausea and vomiting. Vitals: 176/77, 60, 98% on ra, fsbs 350 per ems. Ems states pt with 1.43m elevation in leads 2 and 3. 20g in rfa, ems gave '324mg'$  of asa.  ?

## 2021-06-01 NOTE — Assessment & Plan Note (Signed)
Continue levothyroxine 

## 2021-06-01 NOTE — Assessment & Plan Note (Addendum)
--  home coreg switched to Lopressor by cardio ?--cont Imdur ?

## 2021-06-01 NOTE — Assessment & Plan Note (Signed)
Recent diagnosis during hospitalization a week ago ?Continue hydrocortisone 20 mg twice daily ?

## 2021-06-01 NOTE — Assessment & Plan Note (Addendum)
Continue Remeron and Xanax ?

## 2021-06-01 NOTE — Assessment & Plan Note (Signed)
Euvolemic. 

## 2021-06-01 NOTE — Assessment & Plan Note (Signed)
--  multiple PCI's, last PCI 05/12/2021 ?-Continue aspirin, Plavix, Crestor ?

## 2021-06-01 NOTE — Assessment & Plan Note (Addendum)
-  Troponins 22, 19, not consistent with ACS.   ?--cardiology consulted, deemed likely MSK etiology ?Plan: ?--d/c nitro gtt ?--d/c heparin gtt ?-Increase Ranexa to 1000 mg twice daily  ?-No plans for invasive work-up.  ? ?

## 2021-06-01 NOTE — Progress Notes (Signed)
ANTICOAGULATION CONSULT NOTE - Initial Consult ? ?Pharmacy Consult for Heparin  ?Indication: chest pain/ACS ? ?Allergies  ?Allergen Reactions  ? Desyrel [Trazodone] Other (See Comments)  ?  Pt states this medication brings on seizures   ? Lipitor [Atorvastatin] Other (See Comments)  ?  Leg pains  ? ? ?Patient Measurements: ?  ?Heparin Dosing Weight: 66.2 kg  ? ?Vital Signs: ?Temp: 98.2 ?F (36.8 ?C) (04/30 0112) ?Temp Source: Oral (04/30 0112) ?BP: 166/69 (04/30 0400) ?Pulse Rate: 50 (04/30 0400) ? ?Labs: ?Recent Labs  ?  06/01/21 ?0112  ?HGB 10.0*  ?HCT 32.7*  ?PLT 140*  ?CREATININE 1.66*  ?TROPONINIHS 22*  ? ? ?Estimated Creatinine Clearance: 36.7 mL/min (A) (by C-G formula based on SCr of 1.66 mg/dL (H)). ? ? ?Medical History: ?Past Medical History:  ?Diagnosis Date  ? Acute colitis 01/27/2017  ? Acute pyelonephritis   ? Acute upper GI bleed 01/25/2020  ? Anemia   ? iron deficiency anemia  ? Aortic arch aneurysm ([place])   ? BRCA negative 2014  ? CAD (coronary artery disease)   ? a. 08/2003 Cath: LAD 30-40-med Rx; b. 11/2014 PCI: LAD 73m(3.25x23 Xience Alpine DES); c. 06/2015 PCI: D1 (2.25x12 Resolute Integrity DES); d. 06/2017 PCI: Patent mLAD stent, D2 95 (PTCA); e. 09/2017 PCI: D2 99ost (CBA); d. 12/2017 Cath: LM nl, LAD 366m80d (small), D1 40ost, D2 95ost, LCX 40p, RCA 40ost/p->Med rx for D2 given restenosis.  ? Closed nondisplaced intertrochanteric fracture of left femur (HCSpring Creek9/10/2018  ? Colitis 06/03/2015  ? Colon polyp   ? [facility]  ? Left side weakness.   ? Degenerative tear of glenoid labrum of right shoulder 03/15/2017  ? Diabetes mellitus without complication (HCHastings  ? Family history of breast cancer   ? BRCA neg 2014  ? Femur fracture, left (HCSt. Paul9/10/2018  ? Gastric ulcer 04/27/2011  ? Helicobacter pylori infection 11/23/2014  ? History of echocardiogram   ? a. 03/2017 Echo: EF 60-65%, no rwma; b. 02/2018 Echo: EF 60-65%, no rwma. Nl RV fxn. No cardiac source of emboli (admitted w/  stroke).  ? Hypertension   ? Iron deficiency anemia secondary to blood loss (chronic)   ? Malignant melanoma of skin of scalp (HCRiverside  ? MI, acute, non ST segment elevation (HCBroomfield  ? Neuromuscular disorder (HCLincoln Village  ? S/P drug eluting coronary stent placement 06/04/2015  ? Sepsis (HCMeyer2/14/2019  ? Sepsis secondary to UTI (HCDrowning Creek3/22/2022  ? [facility] ([facility]  ? a. 02/2018 MRI: 60m28mate acute/early subacute L medial frontal lobe inarct; b. 02/2018 MRA No large vessel occlusion or aneurysm. Mod to sev L P2 stenosis. thready L vertebral artery, diffusely dzs'd; c. 02/2018 Carotid U/S: <50% bilat ICA dzs.  ? Unspecified atrial fibrillation (HCCSaddle Rock/22/2022  ? ? ?Medications:  ?(Not in a hospital admission)  ? ?Assessment: ?Pharmacy consulted to dose heparin in this [age]ar old female admitted with ACS/NSTEMI.  No prior anticoag noted.  ?CrCl = 36.7 ml/min  ? ?Goal of Therapy:  ?Heparin level 0.3-0.7 units/ml ?Monitor platelets by anticoagulation protocol: Yes ?  ?Plan:  ?Give 4000 units bolus x 1 ?Start heparin infusion at 800 units/hr ?Check anti-Xa level in 6 hours and daily while on heparin ?Continue to monitor H&H and platelets ? ?Jena Tegeler D ?06/01/2021,4:16 AM ? ? ?

## 2021-06-01 NOTE — Progress Notes (Signed)
?   06/01/21 1330  ?Clinical Encounter Type  ?Visited With Patient  ?Visit Type Initial  ?Referral From Nurse  ? ?Chaplain provided and explained paperwork for HCPOA. ?

## 2021-06-01 NOTE — Assessment & Plan Note (Addendum)
--  not currently active.  A1c has been wnl for the past year, and no hypoglycemics on home med list. ?--d/c BG checks and SSI ?

## 2021-06-01 NOTE — Assessment & Plan Note (Signed)
Continue Vimpat and Keppra and Neurontin ?

## 2021-06-01 NOTE — Plan of Care (Signed)

## 2021-06-01 NOTE — H&P (Addendum)
?History and Physical  ? ? ?Patient: [name] WEX:[id number] DOB: 1970/12/18 ?DOA: 06/01/2021 ?DOS: the patient was seen and examined on 06/01/2021 ?PCP: [name], MD  ?Patient coming from: Home ? ?Chief Complaint:  ?Chief Complaint  ?Patient presents with  ? Chest Pain  ? ? ?HPI: [name] is a 51 y.o. female with medical history significant for CAD, diastolic CHF, chronic kidney disease stage IIIb, history of GI bleed, history of CVA, aortic arch aneurysm, type 2 diabetes, obesity status post gastric bypass surgery, seizure disorder essential hypertension, hospitalized on 4/10 with NSTEMI, now s/p DES stent to RCA, and with recent diagnosis of adrenal insufficiency, on hydrocortisone, who presents with 1 day history of intractable chest pain radiating to the back.  She took up to 10 nitros today but the relief is only transient.  She denies nausea or vomiting, shortness of breath or lower extremity pain or swelling. ?ED course and data reviewed review: BP 165/77 and pulse 55 with O2 sat of 100% on room air troponin 19.  WBC 9200, hemoglobin 10, creatinine at baseline at 1.66.  EKG, personally viewed and interpreted with sinus bradycardia at 58 and no acute ST-T wave changes.  CT angio of the chest with no PE aneurysm or dissection. ?Patient started on a nitroglycerin infusion as well as heparin infusion due to high risk chest pain.  Hospitalist consulted for admission. ? ?Review of Systems: As mentioned in the history of present illness. All other systems reviewed and are negative. ?Past Medical History:  ?Diagnosis Date  ? Acute colitis 01/27/2017  ? Acute pyelonephritis   ? Acute upper GI bleed 01/25/2020  ? Anemia   ? iron deficiency anemia  ? Aortic arch aneurysm ([place])   ? BRCA negative 2014  ? CAD (coronary artery disease)   ? a. 08/2003 Cath: LAD 30-40-med Rx; b. 11/2014 PCI: LAD 9m(3.25x23 Xience Alpine DES); c. 06/2015 PCI: D1 (2.25x12 Resolute Integrity DES); d. 06/2017 PCI:  Patent mLAD stent, D2 95 (PTCA); e. 09/2017 PCI: D2 99ost (CBA); d. 12/2017 Cath: LM nl, LAD 353m80d (small), D1 40ost, D2 95ost, LCX 40p, RCA 40ost/p->Med rx for D2 given restenosis.  ? Closed nondisplaced intertrochanteric fracture of left femur (HCBradner9/10/2018  ? Colitis 06/03/2015  ? Colon polyp   ? CV[facility] (cerebral vascular accident) ([facility]  ? Left side weakness.   ? Degenerative tear of glenoid labrum of right shoulder 03/15/2017  ? Diabetes mellitus without complication (HCNorth Pole  ? Family history of breast cancer   ? BRCA neg 2014  ? Femur fracture, left (HCKyle9/10/2018  ? Gastric ulcer 04/27/2011  ? Helicobacter pylori infection 11/23/2014  ? History of echocardiogram   ? a. 03/2017 Echo: EF 60-65%, no rwma; b. 02/2018 Echo: EF 60-65%, no rwma. Nl RV fxn. No cardiac source of emboli (admitted w/ stroke).  ? Hypertension   ? Iron deficiency anemia secondary to blood loss (chronic)   ? Malignant melanoma of skin of scalp (HCParkdale  ? MI, acute, non ST segment elevation (HCDillsboro  ? Neuromuscular disorder (HCVillard  ? S/P drug eluting coronary stent placement 06/04/2015  ? Sepsis (HCKnox2/14/2019  ? Sepsis secondary to UTI (HCBuckeystown3/22/2022  ? Stroke ([facility]  ? a. 02/2018 MRI: 43m38mate acute/early subacute L medial frontal lobe inarct; b. 02/2018 MRA No large vessel occlusion or aneurysm. Mod to sev L P2 stenosis. thready L vertebral artery, diffusely dzs'd; c. 02/2018 Carotid U/S: <  50% bilat ICA dzs.  ? Unspecified atrial fibrillation ([place]) 04/23/2020  ? ?Past Surgical History:  ?Procedure Laterality Date  ? APPENDECTOMY    ? BALLOON ENTEROSCOPY  02/06/2020  ? [place]  ? BIOPSY N/A 03/14/2020  ? Procedure: BIOPSY;  Surgeon: [name], MD;  Location: [place];  Service: Endoscopy;  Laterality: N/A;  ? CARDIAC CATHETERIZATION N/A 11/09/2014  ? Procedure: Coronary Angiography;  Surgeon: Minna Merritts, MD;  Location: [place] CV LAB;  Service: Cardiovascular;  Laterality: N/A;  ? CARDIAC CATHETERIZATION N/A 11/12/2014  ?  Procedure: Coronary Stent Intervention;  Surgeon: Isaias Cowman, MD;  Location: [place] CV LAB;  Service: Cardiovascular;  Laterality: N/A;  ? CARDIAC CATHETERIZATION N/A 04/18/2015  ? Procedure: Left Heart Cath and Coronary Angiography;  Surgeon: Minna Merritts, MD;  Location: [place] CV LAB;  Service: Cardiovascular;  Laterality: N/A;  ? CARDIAC CATHETERIZATION Left 06/04/2015  ? Procedure: Left Heart Cath and Coronary Angiography;  Surgeon: Wellington Hampshire, MD;  Location: [place] CV LAB;  Service: Cardiovascular;  Laterality: Left;  ? CARDIAC CATHETERIZATION N/A 06/04/2015  ? Procedure: Coronary Stent Intervention;  Surgeon: Wellington Hampshire, MD;  Location: [place] CV LAB;  Service: Cardiovascular;  Laterality: N/A;  ? CESAREAN SECTION  2001  ? CHOLECYSTECTOMY N/A 11/18/2016  ? Procedure: LAPAROSCOPIC CHOLECYSTECTOMY WITH INTRAOPERATIVE CHOLANGIOGRAM;  Surgeon: Christene Lye, MD;  Location: [facility];  Service: General;  Laterality: N/A;  ? COLONOSCOPY WITH PROPOFOL N/A 04/27/2016  ? Procedure: COLONOSCOPY WITH PROPOFOL;  Surgeon: [name], MD;  Location: [place];  Service: Endoscopy;  Laterality: N/A;  ? COLONOSCOPY WITH PROPOFOL N/A 01/12/2018  ? Procedure: COLONOSCOPY WITH PROPOFOL;  Surgeon: Toledo, Benay Pike, MD;  Location: [facility] ENDOSCOPY;  Service: Endoscopy;  Laterality: N/A;  ? COLONOSCOPY WITH PROPOFOL N/A 03/14/2020  ? Procedure: COLONOSCOPY WITH PROPOFOL;  Surgeon: [name], MD;  Location: [place];  Service: Endoscopy;  Laterality: N/A;  Needs to be scheduled after 7:30 due to driver issues  ? CORONARY ANGIOPLASTY    ? CORONARY BALLOON ANGIOPLASTY N/A 06/29/2017  ? Procedure: CORONARY BALLOON ANGIOPLASTY;  Surgeon: Wellington Hampshire, MD;  Location: [place] CV LAB;  Service: Cardiovascular;  Laterality: N/A;  ? CORONARY BALLOON ANGIOPLASTY N/A 09/20/2017  ? Procedure: CORONARY BALLOON ANGIOPLASTY;  Surgeon: Wellington Hampshire, MD;   Location: [place] CV LAB;  Service: Cardiovascular;  Laterality: N/A;  ? CORONARY STENT INTERVENTION N/A 12/13/2019  ? Procedure: CORONARY STENT INTERVENTION;  Surgeon: Wellington Hampshire, MD;  Location: [place] CV LAB;  Service: Cardiovascular;  Laterality: N/A;  ? CORONARY STENT INTERVENTION N/A 05/12/2021  ? Procedure: CORONARY STENT INTERVENTION;  Surgeon: Nelva Bush, MD;  Location: [place] CV LAB;  Service: Cardiovascular;  Laterality: N/A;  ? DILATION AND CURETTAGE OF UTERUS    ? ESOPHAGOGASTRODUODENOSCOPY (EGD) WITH PROPOFOL N/A 09/14/2014  ? Procedure: ESOPHAGOGASTRODUODENOSCOPY (EGD) WITH PROPOFOL;  Surgeon: Josefine Class, MD;  [facility]COPY;  Service: Endoscopy;  Laterality: N/A;  ? ESOPHAGOGASTRODUODENOSCOPY (EGD) WITH PROPOFOL N/A 04/27/2016  ? Procedure: ESOPHAGOGASTRODUODENOSCOPY (EGD) WITH PROPOFOL;  Surgeon: [name], MD;  Location: [place];  Service: Endoscopy;  Laterality: N/A;  Diabetic - oral meds  ? ESOPHAGOGASTRODUODENOSCOPY (EGD) WITH PROPOFOL N/A 01/12/2018  ? Procedure: ESOPHAGOGASTRODUODENOSCOPY (EGD) WITH PROPOFOL;  Surgeon: Toledo, Benay Pike, MD;  Location: [facility] ENDOSCOPY;  Service: Endoscopy;  Laterality: N/A;  ? ESOPHAGOGASTRODUODENOSCOPY (EGD) WITH PROPOFOL N/A 04/11/2019  ? Procedure: ESOPHAGOGASTRODUODENOSCOPY (EGD) WITH PROPOFOL;  Surgeon: [name], MD;  [facility] END[facility]COPY;  Service: Endoscopy;  Laterality: N/A;  ? ESOPHAGOGASTRODUODENOSCOPY (EGD) WITH PROPOFOL N/A 03/08/2020  ? Procedure: ESOPHAGOGASTRODUODENOSCOPY (EGD) WITH PROPOFOL;  Surgeon: [name], MD;  Location: [facility] ENDOSCOPY;  Service: Endoscopy;  Laterality: N/A;  ? GASTRIC BYPASS  09/2009  ? [facility]   ? INTRAMEDULLARY (IM) NAIL INTERTROCHANTERIC Left 10/13/2018  ? Procedure: INTRAMEDULLARY (IM) NAIL INTERTROCHANTRIC;  Surgeon: Leandrew Koyanagi, MD;  Location: [place];  Service: Orthopedics;  Laterality: Left;  ? Left Carotid to sublcavian artery  bypass w/ subclavian artery ligation    ? a. Performed @ [facility].  ? LEFT HEART CATH AND CORONARY ANGIOGRAPHY Left 06/29/2017  ? Procedure: LEFT HEART CATH AND CORONARY ANGIOGRAPHY;  Surgeon: Cena Benton

## 2021-06-01 NOTE — ED Provider Notes (Signed)
? ?[name] ?Provider Note ? ? ? Event Date/Time  ? First MD Initiated Contact with Patient 06/01/21 0150   ?  (approximate) ? ? ?History  ? ?Chest Pain ? ? ?HPI ? ?[name] is a 51 y.o. female with a history of CAD status post NSTEMI and catheterization 20 days ago with severe two-vessel disease and PCI to the distal RCA on Plavix, anemia, CVA, diabetes, A-fib who presents for evaluation of chest pain.  Patient reports that she has had chest pain constantly throughout the day today.  She said the pain initially started as a stabbing central pain going into her back.  Then it developed into chest pressure which has been constant throughout most of the day.  She reports taking more than 10 nitros today.  The pain is responsive to nitro but recurs.  She had some shortness of breath associated with it.  She denies abdominal pain, cough, fever.  No prior history of PE or DVT, no leg pain or swelling, no hemoptysis or exogenous hormones. ?  ? ? ?Past Medical History:  ?Diagnosis Date  ? Acute colitis 01/27/2017  ? Acute pyelonephritis   ? Acute upper GI bleed 01/25/2020  ? Anemia   ? iron deficiency anemia  ? Aortic arch aneurysm ([place])   ? BRCA negative 2014  ? CAD (coronary artery disease)   ? a. 08/2003 Cath: LAD 30-40-med Rx; b. 11/2014 PCI: LAD 78m(3.25x23 Xience Alpine DES); c. 06/2015 PCI: D1 (2.25x12 Resolute Integrity DES); d. 06/2017 PCI: Patent mLAD stent, D2 95 (PTCA); e. 09/2017 PCI: D2 99ost (CBA); d. 12/2017 Cath: LM nl, LAD 381m80d (small), D1 40ost, D2 95ost, LCX 40p, RCA 40ost/p->Med rx for D2 given restenosis.  ? Closed nondisplaced intertrochanteric fracture of left femur (HCFort Ransom9/10/2018  ? Colitis 06/03/2015  ? Colon polyp   ? CVA (cerebral vascular accident) (HRichmond State [name]  ? Left side weakness.   ? Degenerative tear of glenoid labrum of right shoulder 03/15/2017  ? Diabetes mellitus without complication (HCDeerfield  ? Family history of breast cancer   ? BRCA neg 2014  ? Femur  fracture, left (HCClallam9/10/2018  ? Gastric ulcer 04/27/2011  ? Helicobacter pylori infection 11/23/2014  ? History of echocardiogram   ? a. 03/2017 Echo: EF 60-65%, no rwma; b. 02/2018 Echo: EF 60-65%, no rwma. Nl RV fxn. No cardiac source of emboli (admitted w/ stroke).  ? Hypertension   ? Iron deficiency anemia secondary to blood loss (chronic)   ? Malignant melanoma of skin of scalp (HCSiasconset  ? MI, acute, non ST segment elevation (HCDadeville  ? Neuromuscular disorder (HCTownsend  ? S/P drug eluting coronary stent placement 06/04/2015  ? Sepsis (HCLake View2/14/2019  ? Sepsis secondary to UTI (HCMcGrath3/22/2022  ? Stroke (HPender Community [name]  ? a. 02/2018 MRI: 16m716mate acute/early subacute L medial frontal lobe inarct; b. 02/2018 MRA No large vessel occlusion or aneurysm. Mod to sev L P2 stenosis. thready L vertebral artery, diffusely dzs'd; c. 02/2018 Carotid U/S: <50% bilat ICA dzs.  ? Unspecified atrial fibrillation (HCCRadium Springs/22/2022  ? ? ?Past Surgical History:  ?Procedure Laterality Date  ? APPENDECTOMY    ? BALLOON ENTEROSCOPY  02/06/2020  ? [place] BIOPSY N/A 03/14/2020  ? Procedure: BIOPSY;  Surgeon: WohLucilla LameD;  [place];  Laterality: N/A;  ? CARDIAC CATHETERIZATION N/A 11/09/2014  ? Procedure: Coronary Angiography;  Surgeon: TimMinna Merritts  MD;  Location: [place] CV LAB;  Service: Cardiovascular;  Laterality: N/A;  ? CARDIAC CATHETERIZATION N/A 11/12/2014  ? Procedure: Coronary Stent Intervention;  Surgeon: Isaias Cowman, MD;  Location: [place] CV LAB;  Service: Cardiovascular;  Laterality: N/A;  ? CARDIAC CATHETERIZATION N/A 04/18/2015  ? Procedure: Left Heart Cath and Coronary Angiography;  Surgeon: Minna Merritts, MD;  Location: [place] CV LAB;  Service: Cardiovascular;  Laterality: N/A;  ? CARDIAC CATHETERIZATION Left 06/04/2015  ? Procedure: Left Heart Cath and Coronary Angiography;  Surgeon: Wellington Hampshire, MD;  Location: [place] CV LAB;  Service: Cardiovascular;   Laterality: Left;  ? CARDIAC CATHETERIZATION N/A 06/04/2015  ? Procedure: Coronary Stent Intervention;  Surgeon: Wellington Hampshire, MD;  Location: [place] CV LAB;  Service: Cardiovascular;  Laterality: N/A;  ? CESAREAN SECTION  2001  ? CHOLECYSTECTOMY N/A 11/18/2016  ? Procedure: LAPAROSCOPIC CHOLECYSTECTOMY WITH INTRAOPERATIVE CHOLANGIOGRAM;  Surgeon: Christene Lye, MD;  Location: [facility];  Service: General;  Laterality: N/A;  ? COLONOSCOPY WITH PROPOFOL N/A 04/27/2016  ? Procedure: COLONOSCOPY WITH PROPOFOL;  Surgeon: [name], MD;  Location: [place];  Service: Endoscopy;  Laterality: N/A;  ? COLONOSCOPY WITH PROPOFOL N/A 01/12/2018  ? Procedure: COLONOSCOPY WITH PROPOFOL;  Surgeon: [place], Benay Pike, MD;  Location: [facility] ENDOSCOPY;  Service: Endoscopy;  Laterality: N/A;  ? COLONOSCOPY WITH PROPOFOL N/A 03/14/2020  ? Procedure: COLONOSCOPY WITH PROPOFOL;  Surgeon: [name], MD;  Location: [place];  Service: Endoscopy;  Laterality: N/A;  Needs to be scheduled after 7:30 due to driver issues  ? CORONARY ANGIOPLASTY    ? CORONARY BALLOON ANGIOPLASTY N/A 06/29/2017  ? Procedure: CORONARY BALLOON ANGIOPLASTY;  Surgeon: Wellington Hampshire, MD;  Location: [place] CV LAB;  Service: Cardiovascular;  Laterality: N/A;  ? CORONARY BALLOON ANGIOPLASTY N/A 09/20/2017  ? Procedure: CORONARY BALLOON ANGIOPLASTY;  Surgeon: Wellington Hampshire, MD;  Location: [place] CV LAB;  Service: Cardiovascular;  Laterality: N/A;  ? CORONARY STENT INTERVENTION N/A 12/13/2019  ? Procedure: CORONARY STENT INTERVENTION;  Surgeon: Wellington Hampshire, MD;  Location: [place] CV LAB;  Service: Cardiovascular;  Laterality: N/A;  ? CORONARY STENT INTERVENTION N/A 05/12/2021  ? Procedure: CORONARY STENT INTERVENTION;  Surgeon: Nelva Bush, MD;  Location: [place] CV LAB;  Service: Cardiovascular;  Laterality: N/A;  ? DILATION AND CURETTAGE OF UTERUS    ? ESOPHAGOGASTRODUODENOSCOPY (EGD) WITH  PROPOFOL N/A 09/14/2014  ? Procedure: ESOPHAGOGASTRODUODENOSCOPY (EGD) WITH PROPOFOL;  Surgeon: Josefine Class, MD;  Location[facility] ENDOSCOPY;  Service: Endoscopy;  Laterality: N/A;  ? ESOPHAGOGASTRODUODENOSCOPY (EGD) WITH PROPOFOL N/A 04/27/2016  ? Procedure: ESOPHAGOGASTRODUODENOSCOPY (EGD) WITH PROPOFOL;  Surgeon: [name], MD;  Location: [place];  Service: Endoscopy;  Laterality: N/A;  Diabetic - oral meds  ? ESOPHAGOGASTRODUODENOSCOPY (EGD) WITH PROPOFOL N/A 01/12/2018  ? Procedure: ESOPHAGOGASTRODUODENOSCOPY (EGD) WITH PROPOFOL;  Surgeon: [place], Benay Pike, MD;  Location: [facility] ENDOSCOPY;  Service: Endoscopy;  Laterality: N/A;  ? ESOPHAGOGASTRODUODENOSCOPY (EGD) WITH PROPOFOL N/A 04/11/2019  ? Procedure: ESOPHAGOGASTRODUODENOSCOPY (EGD) WITH PROPOFOL;  Surgeon: [name], MD;  [name] [facility]COPY;  Service: Endoscopy;  Laterality: N/A;  ? ESOPHAGOGASTRODUODENOSCOPY (EGD) WITH PROPOFOL N/A 03/08/2020  ? Procedure: ESOPHAGOGASTRODUODENOSCOPY (EGD) WITH PROPOFOL;  Surgeon: [name], MD;  Location: [name] ENDOSCOPY;  Service: Endoscopy;  Laterality: N/A;  ? GASTRIC BYPASS  09/2009  ? [name]   ? INTRAMEDULLARY (IM) NAIL INTERTROCHANTERIC Left 10/13/2018  ? Procedure: INTRAMEDULLARY (IM) NAIL INTERTROCHANTRIC;  Surgeon: Erlinda Hong,  Marylynn Pearson, MD;  Location: [place];  Service: Orthopedics;  Laterality: Left;  ? Left Carotid to sublcavian artery bypass w/ subclavian artery ligation    ? a. Performed @ [facility].  ? LEFT HEART CATH AND CORONARY ANGIOGRAPHY Left 06/29/2017  ? Procedure: LEFT HEART CATH AND CORONARY ANGIOGRAPHY;  Surgeon: Wellington Hampshire, MD;  Location: [place] CV LAB;  Service: Cardiovascular;  Laterality: Left;  ? LEFT HEART CATH AND CORONARY ANGIOGRAPHY N/A 09/20/2017  ? Procedure: LEFT HEART CATH AND CORONARY ANGIOGRAPHY;  Surgeon: Wellington Hampshire, MD;  Location: [place] CV LAB;  Service: Cardiovascular;  Laterality: N/A;  ? LEFT HEART CATH AND CORONARY  ANGIOGRAPHY N/A 12/20/2017  ? Procedure: LEFT HEART CATH AND CORONARY ANGIOGRAPHY;  Surgeon: Wellington Hampshire, MD;  Location: [place] CV LAB;  Service: Cardiovascular;  Laterality: N/A;  ? LEFT HEART C

## 2021-06-01 NOTE — Progress Notes (Signed)
?  Progress Note ? ? ?Patient: [name] JWJ:[id number] DOB: Sep 15, 1970 DOA: 06/01/2021     0 ?DOS: the patient was seen and examined on 06/01/2021 ?  ?Brief hospital course: ?No notes on file ? ?Assessment and Plan: ?Chest pain in patient with underlying coronary artery disease ?-Troponins 22, 19, not consistent with ACS.   ?--cardiology consulted, deemed likely MSK etiology ?Plan: ?--d/c nitro gtt ?--d/c heparin gtt ?-Increase Ranexa to 1000 mg twice daily  ?-No plans for invasive work-up.  ? ? ?Adrenal insufficiency ([place]) ?Recent diagnosis during hospitalization a week ago ?Continue hydrocortisone 20 mg twice daily ? ?History of diabetes mellitus ?--not currently active.  A1c has been wnl for the past year, and no hypoglycemics on home med list. ?--d/c BG checks and SSI ? ?CAD S/P percutaneous coronary angioplasty ?--multiple PCI's, last PCI 05/12/2021 ?-Continue aspirin, Plavix, Crestor ? ?Chronic diastolic CHF (congestive heart failure) ([place]) ?Euvolemic ? ?Seizure disorder ([place]) ?Continue Vimpat and Keppra and Neurontin ? ?Stage 3b chronic kidney disease ([place]) ?At baseline ? ?Hypothyroidism ?Continue levothyroxine ? ?Chronic systolic CHF (congestive heart failure) ([place]) ?Euvolemic.  Continue Coreg ? ?Essential hypertension ?--home coreg switched to Lopressor by cardio ?--cont Imdur ? ?Depression with anxiety ?Continue Remeron and Xanax ? ? ? ? ?  ? ?Subjective:  ?Chest pain improved.  Pt now complained of back pain. ? ? ?Physical Exam: ? ?Constitutional: NAD, AAOx3 ?HEENT: conjunctivae and lids normal, EOMI ?CV: No cyanosis.   ?RESP: normal respiratory effort, on RA ?Extremities: No effusions, edema in BLE ?SKIN: warm, dry ?Neuro: II - XII grossly intact.   ?Psych: Normal mood and affect.  Appropriate judgement and reason ? ? ?Data Reviewed: ? ?Family Communication:  ? ?Disposition: ?Status is: Observation ? ? Planned Discharge Destination: Home ? ? ? ?Time spent: 50 minutes ? ?Author: ?Enzo Bi,  MD ?06/01/2021 4:52 PM ? ?For on call review [email or web address].  ?

## 2021-06-02 ENCOUNTER — Inpatient Hospital Stay: Payer: Medicare Other | Admitting: Physician Assistant

## 2021-06-02 ENCOUNTER — Telehealth: Payer: Self-pay | Admitting: Cardiovascular Disease

## 2021-06-02 DIAGNOSIS — R079 Chest pain, unspecified: Secondary | ICD-10-CM | POA: Diagnosis not present

## 2021-06-02 DIAGNOSIS — I208 Other forms of angina pectoris: Secondary | ICD-10-CM | POA: Diagnosis not present

## 2021-06-02 LAB — BASIC METABOLIC PANEL
Anion gap: 8 (ref 5–15)
BUN: 33 mg/dL — ABNORMAL HIGH (ref 6–20)
CO2: 24 mmol/L (ref 22–32)
Calcium: 8 mg/dL — ABNORMAL LOW (ref 8.9–10.3)
Chloride: 110 mmol/L (ref 98–111)
Creatinine, Ser: 1.76 mg/dL — ABNORMAL HIGH (ref 0.44–1.00)
GFR, Estimated: 34 mL/min — ABNORMAL LOW (ref >60)
Glucose, Bld: 125 mg/dL — ABNORMAL HIGH (ref 70–99)
Potassium: 4.9 mmol/L (ref 3.5–5.1)
Sodium: 142 mmol/L (ref 135–145)

## 2021-06-02 LAB — CBC
HCT: 33 % — ABNORMAL LOW (ref 36.0–46.0)
Hemoglobin: 10.1 g/dL — ABNORMAL LOW (ref 12.0–15.0)
MCH: 25.6 pg — ABNORMAL LOW (ref 26.0–34.0)
MCHC: 30.6 g/dL (ref 30.0–36.0)
MCV: 83.8 fL (ref 80.0–100.0)
Platelets: 145 10*3/uL — ABNORMAL LOW (ref 150–400)
RBC: 3.94 MIL/uL (ref 3.87–5.11)
RDW: 13.6 % (ref 11.5–15.5)
WBC: 9.1 10*3/uL (ref 4.0–10.5)
nRBC: 0 % (ref 0.0–0.2)

## 2021-06-02 LAB — MAGNESIUM: Magnesium: 1.9 mg/dL (ref 1.7–2.4)

## 2021-06-02 MED ORDER — AMLODIPINE BESYLATE 10 MG PO TABS
10.0000 mg | ORAL_TABLET | Freq: Every day | ORAL | Status: DC
  Administered 2021-06-02: 10 mg via ORAL
  Filled 2021-06-02: qty 1

## 2021-06-02 MED ORDER — ALPRAZOLAM 1 MG PO TABS
1.0000 mg | ORAL_TABLET | Freq: Two times a day (BID) | ORAL | 5 refills | Status: AC | PRN
Start: 2021-06-02 — End: ?

## 2021-06-02 MED ORDER — CARVEDILOL 12.5 MG PO TABS: 12.5000 mg | ORAL_TABLET | Freq: Two times a day (BID) | ORAL | 2 refills | Status: AC

## 2021-06-02 MED ORDER — DIPHENOXYLATE-ATROPINE 2.5-0.025 MG PO TABS
1.0000 | ORAL_TABLET | Freq: Four times a day (QID) | ORAL | Status: DC | PRN
  Administered 2021-06-02: 1 via ORAL
  Filled 2021-06-02: qty 1

## 2021-06-02 MED ORDER — RANOLAZINE ER 500 MG PO TB12: 1000.0000 mg | ORAL_TABLET | Freq: Two times a day (BID) | ORAL | Status: AC

## 2021-06-02 MED ORDER — LOPERAMIDE HCL 2 MG PO CAPS
4.0000 mg | ORAL_CAPSULE | ORAL | Status: DC | PRN
  Administered 2021-06-02: 4 mg via ORAL
  Filled 2021-06-02: qty 2

## 2021-06-02 MED ORDER — ISOSORBIDE MONONITRATE ER 60 MG PO TB24
60.0000 mg | ORAL_TABLET | Freq: Every day | ORAL | 2 refills | Status: AC
Start: 2021-06-02 — End: 2021-08-31

## 2021-06-02 MED ORDER — CARVEDILOL 12.5 MG PO TABS
12.5000 mg | ORAL_TABLET | Freq: Two times a day (BID) | ORAL | Status: DC
  Administered 2021-06-02: 12.5 mg via ORAL
  Filled 2021-06-02: qty 1

## 2021-06-02 NOTE — Telephone Encounter (Signed)
5-19 appt already in place noted for hospital fu  ?

## 2021-06-02 NOTE — Discharge Summary (Addendum)
? ?Physician Discharge Summary ? ? ?[place]  female DOB: 1970-02-26  ?UEA:[id number] ? ?PCP: [name], MD ? ?Admit date: 06/01/2021 ?Discharge date: 06/02/2021 ? ?Admitted From: home ?Disposition:  home ?CODE STATUS: DNR ? ?Hospital Course:  ?For full details, please see H&P, progress notes, consult notes and ancillary notes.  ?Briefly,  ?Jordon Marita [place] [name] is a 51 y.o. female with medical history significant for CAD, diastolic CHF, chronic kidney disease stage IIIb, history of GI bleed, history of CVA, aortic arch aneurysm, type 2 diabetes, obesity status post gastric bypass surgery, seizure disorder, essential hypertension, hospitalized on 4/10 with NSTEMI, now s/p DES stent to RCA, and with recent diagnosis of adrenal insufficiency, on hydrocortisone, who presented with 1 day history of intractable chest pain radiating to the back.  She took up to 10 nitros today but the relief is only transient. ? ?Chest pain in patient with underlying coronary artery disease ?-Troponins 22, 19, not consistent with ACS.   ?--cardiology consulted, deemed likely MSK etiology.  No plans for invasive work-up.  ?--Increase Imdur from 15 to 60 daily ?--Already taking ranexa 1000 mg BID ?--close followup with cardiology ? ?CAD S/P percutaneous coronary angioplasty ?--multiple PCI's, last PCI 05/12/2021 ?--Continue aspirin, Plavix, Crestor ? ?Essential hypertension ?--switch from Lopressor to coreg 12.5 mg BID due to elevated BP. ?--Increase Imdur from 15 to 60 daily ?--cont home ranexa 1000 mg BID ?--not taking amlodipine PTA ? ?Chronic diastolic CHF (congestive heart failure) ([place]) ?Euvolemic.  On Lasix 20 mg PRN PTA. ?  ?Adrenal insufficiency ([place]) ?Recent diagnosis during hospitalization a week ago ?Continue hydrocortisone 20 mg twice daily ?  ?History of diabetes mellitus ?--not currently active.  A1c has been wnl for the past year, and no hypoglycemics on home med list. ? ?Seizure disorder ([place]) ?Continue  Vimpat and Keppra and Neurontin ?  ?Stage 3b chronic kidney disease ([place]) ?At baseline ?  ?Hypothyroidism ?Continue levothyroxine ?  ?Depression with anxiety ?Continue Remeron and home Xanax PRN ? ?Chronic diarrhea ?--cont home Lomotil ? ? ?Discharge Diagnoses:  ?Active Problems: ?  Chest pain in patient with underlying coronary artery disease ?  Adrenal insufficiency ([place]) ?  History of diabetes mellitus ?  Depression with anxiety ?  Essential hypertension ?  Hypothyroidism ?  Stage 3b chronic kidney disease ([place]) ?  Seizure disorder ([place]) ?  Chronic diastolic CHF (congestive heart failure) ([place]) ?  CAD S/P percutaneous coronary angioplasty ? ? ?30 Day Unplanned Readmission Risk Score   ? ?Flowsheet Row ED to Hosp-Admission (Discharged) from 05/08/2021 in [facility] ICU/CCU  ?30 Day Unplanned Readmission Risk Score (%) 60.28 Filed at 05/13/2021 1200  ? ?  ? ? This score is the patient's risk of an unplanned readmission within 30 days of being discharged (0 -100%). The score is based on dignosis, age, lab data, medications, orders, and past utilization.   ?Low:  0-14.9   Medium: 15-21.9   High: 22-29.9   Extreme: 30 and above ? ?  ? ?  ? ? ?Discharge Instructions: ? ?Allergies as of 06/02/2021   ? ?   Reactions  ? Desyrel [trazodone] Other (See Comments)  ? Pt states this medication brings on seizures   ? Lipitor [atorvastatin] Other (See Comments)  ? Leg pains  ? ?  ? ?  ?Medication List  ?  ? ?STOP taking these medications   ? ?amLODipine 10 MG tablet ?Commonly known as: NORVASC ?  ?furosemide 20 MG tablet ?Commonly  known as: LASIX ?  ?metoprolol tartrate 25 MG tablet ?Commonly known as: LOPRESSOR ?  ?nitrofurantoin 100 MG capsule ?Commonly known as: MACRODANTIN ?  ? ?  ? ?TAKE these medications   ? ?Aimovig 140 MG/ML Soaj ?Generic drug: Erenumab-aooe ?Inject 140 mg into the skin every 28 (twenty-eight) days. ?  ?ALPRAZolam 1 MG tablet ?Commonly known as: Duanne Moron ?Take 1 tablet (1 mg total) by  mouth 2 (two) times daily as needed for anxiety. Home med. ?What changed:  ?when to take this ?reasons to take this ?additional instructions ?  ?aspirin 81 MG EC tablet ?Take 1 tablet (81 mg total) by mouth daily. Swallow whole. ?  ?Baclofen 5 MG Tabs ?Take 5 mg by mouth 2 (two) times daily as needed (muscle spasms). ?  ?carvedilol 12.5 MG tablet ?Commonly known as: COREG ?Take 1 tablet (12.5 mg total) by mouth 2 (two) times daily. Switched from [employer] to [employer] ?What changed: additional instructions ?  ?clopidogrel 75 MG tablet ?Commonly known as: PLAVIX ?Take 1 tablet (75 mg total) by mouth daily. ?  ?desvenlafaxine 50 MG 24 hr tablet ?Commonly known as: PRISTIQ ?Take 1 tablet by mouth every morning ?  ?diclofenac Sodium 1 % Gel ?Commonly known as: VOLTAREN ?Apply 2 g topically 4 (four) times daily as needed (joint paim). ?  ?diphenoxylate-atropine 2.5-0.025 MG tablet ?Commonly known as: Lomotil ?Take 1 tablet by mouth 4 (four) times daily as needed for diarrhea or loose stools. ?  ?estradiol 0.1 MG/GM vaginal cream ?Commonly known as: ESTRACE ?Place 1 Applicatorful vaginally 3 (three) times a week. Estrogen Cream Instruction Discard applicator Apply pea sized amount to tip of finger to urethra before bed. Wash hands well after application. Use Monday, Wednesday and Friday ?  ?gabapentin 300 MG capsule ?Commonly known as: NEURONTIN ?Take 2 capsules by mouth twice daily ?  ?hydrocortisone 20 MG tablet ?Commonly known as: CORTEF ?Take 1 tablet (20 mg total) by mouth 2 (two) times daily. ?  ?isosorbide mononitrate 60 MG 24 hr tablet ?Commonly known as: IMDUR ?Take 1 tablet (60 mg total) by mouth daily. Increased from 15 mg daily. ?What changed:  ?medication strength ?how much to take ?additional instructions ?  ?lacosamide 200 MG Tabs tablet ?Commonly known as: VIMPAT ?Take 1 tablet (200 mg total) by mouth 2 (two) times daily. ?  ?levETIRAcetam 1000 MG tablet ?Commonly known as: KEPPRA ?Take 1 tablet (1,000 mg  total) by mouth 2 (two) times daily. ?  ?levothyroxine 25 MCG tablet ?Commonly known as: SYNTHROID ?Take 1 tablet (25 mcg total) by mouth daily before breakfast. ?  ?mirabegron ER 50 MG Tb24 tablet ?Commonly known as: MYRBETRIQ ?Take 1 tablet (50 mg total) by mouth daily. ?  ?mirtazapine 7.5 MG tablet ?Commonly known as: REMERON ?Take 1 tablet (7.5 mg total) by mouth at bedtime. ?  ?nitroGLYCERIN 0.4 MG SL tablet ?Commonly known as: NITROSTAT ?Dissolve 1 tablet under tongue every 7mns, up to 3 doses, for chest pain. Max 3 doses in 317ms. ?What changed: See the new instructions. ?  ?Nurtec 75 MG Tbdp ?Generic drug: Rimegepant Sulfate ?Take 75 mg by mouth as needed. ?What changed: reasons to take this ?  ?ofloxacin 0.3 % ophthalmic solution ?Commonly known as: OCUFLOX ?Place 1 drop into the right eye 3 (three) times daily. ?  ?pantoprazole 40 MG tablet ?Commonly known as: PROTONIX ?Take 1 tablet by mouth twice daily ?  ?prednisoLONE acetate 1 % ophthalmic suspension ?Commonly known as: PRED FORTE ?Place 1 drop into the right eye  3 (three) times daily. ?  ?promethazine 25 MG tablet ?Commonly known as: PHENERGAN ?Take 1 tablet by mouth every 6 hours as needed for nausea or vomiting. ?  ?ranolazine 500 MG 12 hr tablet ?Commonly known as: Ranexa ?Take 2 tablets (1,000 mg total) by mouth 2 (two) times daily. Home med. ?What changed:  ?how much to take ?additional instructions ?  ?rosuvastatin 40 MG tablet ?Commonly known as: CRESTOR ?Take 1 tablet (40 mg total) by mouth daily at 6 PM. ?  ?SYSTANE OP ?Place 1 drop into both eyes daily as needed (dry eyes). ?  ? ?  ? ? ? Follow-up Information   ? ? [name], MD Follow up.   ?Specialty: Family Medicine ?Why: app on 06/04/21 1:40pm ?Contact information: ?[address] ?[address] ?[place] [address] ?[phone] ? ? ?  ?  ? ? Minna Merritts, MD Follow up in 2 week(s).   ?Specialty: Cardiology ?Why: appointment 5/ ?Contact information: ?[address] ?[place] [address] ?[phone] ? ? ?  ?  ? ?  ?  ? ?  ? ? ?Allergies  ?Allergen Reactions  ? Desyrel [Trazodone] Other (See Comments)  ?  Pt states this medication brings on seizures   ? Lipitor Donell Sievert

## 2021-06-02 NOTE — Telephone Encounter (Signed)
-----   Message from Rise Mu, PA-C sent at 06/02/2021  9:28 AM EDT ----- ?Please schedule hospital follow up with Dr. [name].  ? ?

## 2021-06-02 NOTE — Progress Notes (Signed)
? ? ?Progress Note ? ?Patient Name: [name] ?Date of Encounter: 06/02/2021 ? ?Primary Cardiologist: [name] ? ?Subjective  ? ?She is chest pain-free.  Intermittent palpitations.  She is getting ready to be discharged home. ? ?Inpatient Medications  ?  ?Scheduled Meds: ? amLODipine  10 mg Oral Daily  ? aspirin EC  81 mg Oral Daily  ? atorvastatin  80 mg Oral Daily  ? carvedilol  12.5 mg Oral BID WC  ? clopidogrel  75 mg Oral Daily  ? gabapentin  600 mg Oral BID  ? hydrocortisone  20 mg Oral BID  ? isosorbide mononitrate  60 mg Oral Daily  ? lacosamide  200 mg Oral BID  ? levETIRAcetam  1,000 mg Oral BID  ? levothyroxine  25 mcg Oral Q0600  ? mirabegron ER  50 mg Oral Daily  ? mirtazapine  7.5 mg Oral QHS  ? ofloxacin  1 drop Right Eye TID  ? pantoprazole  40 mg Oral BID  ? prednisoLONE acetate  1 drop Right Eye Daily  ? ranolazine  1,000 mg Oral BID  ? ?Continuous Infusions: ? ?PRN Meds: ?acetaminophen, ALPRAZolam, baclofen, loperamide, ondansetron (ZOFRAN) IV, promethazine  ? ?Vital Signs  ?  ?Vitals:  ? 06/01/21 1953 06/01/21 2327 06/02/21 0355 06/02/21 0720  ?BP: (!) 151/79 (!) 174/87 (!) 154/76 (!) 190/79  ?Pulse: 65 70 (!) 51 (!) 57  ?Resp: '17 16 17 17  '$ ?Temp: 97.8 ?F (36.6 ?C) 98 ?F (36.7 ?C) 98 ?F (36.7 ?C) 98.1 ?F (36.7 ?C)  ?TempSrc:    Oral  ?SpO2: 99% 99% 98% 99%  ?Weight:      ?Height:      ? ? ?Intake/Output Summary (Last 24 hours) at 06/02/2021 0850 ?Last data filed at 06/01/2021 1844 ?Gross per 24 hour  ?Intake 738.16 ml  ?Output 1000 ml  ?Net -261.84 ml  ? ?Filed Weights  ? 06/01/21 1238  ?Weight: 67.5 kg  ? ? ?Telemetry  ?  ?Sinus rhythm- Personally Reviewed ? ?ECG  ?  ?NSR, 75 bpm, LVH, nonspecific st/t changes  - Personally Reviewed ? ?Physical Exam  ? ?GEN: No acute distress.   ?Neck: No JVD. ?Cardiac: Regular but mildly tachycardic, no murmurs, rubs, or gallops.  ?Respiratory: Clear to auscultation bilaterally.  ?GI: Soft, nontender, non-distended.   ?MS: No edema; No deformity. ?Neuro:   Alert and oriented x 3; Nonfocal.  ?Psych: Normal affect. ? ?Labs  ?  ?Chemistry ?Recent Labs  ?Lab 06/01/21 ?0112 06/02/21 ?6644  ?NA 141 142  ?K 4.0 4.9  ?CL 110 110  ?CO2 21* 24  ?GLUCOSE 220* 125*  ?BUN 38* 33*  ?CREATININE 1.66* 1.76*  ?CALCIUM 7.8* 8.0*  ?GFRNONAA 37* 34*  ?ANIONGAP 10 8  ?  ? ?Hematology ?Recent Labs  ?Lab 06/01/21 ?0112 06/02/21 ?0347  ?WBC 9.2 9.1  ?RBC 3.86* 3.94  ?HGB 10.0* 10.1*  ?HCT 32.7* 33.0*  ?MCV 84.7 83.8  ?MCH 25.9* 25.6*  ?MCHC 30.6 30.6  ?RDW 13.5 13.6  ?PLT 140* 145*  ? ? ?Cardiac EnzymesNo results for input(s): TROPONINI in the last 168 hours. No results for input(s): TROPIPOC in the last 168 hours.  ? ?BNPNo results for input(s): BNP, PROBNP in the last 168 hours.  ? ?DDimer No results for input(s): DDIMER in the last 168 hours.  ? ?Radiology  ?  ?DG Chest 2 View ? ?Result Date: 06/01/2021 ?IMPRESSION: No active cardiopulmonary disease. Electronically Signed   By: Inez Catalina M.D.   On: 06/01/2021  01:31  ? ? ?CT Angio Chest/Abd/Pel for Dissection W and/or Wo Contrast ? ?Result Date: 06/01/2021 ?IMPRESSION: No evidence of thoracoabdominal aortic aneurysm or dissection. No evidence of central pulmonary embolism. Trace left pleural effusion. Additional postsurgical and stable ancillary findings as above. Electronically Signed   By: Julian Hy M.D.   On: 06/01/2021 03:40   ? ?Cardiac Studies  ? ?2D echo 06/01/2021: ?1. Left ventricular ejection fraction, by estimation, is 55 to 60%. Left  ?ventricular ejection fraction by 2D MOD biplane is 59.1 %. The left  ?ventricle has normal function. The left ventricle has no regional wall  ?motion abnormalities. Left ventricular  ?diastolic parameters were normal.  ? 2. Right ventricular systolic function is normal. The right ventricular  ?size is normal.  ? 3. The mitral valve is normal in structure. Mild mitral valve  ?regurgitation.  ? 4. Lambl's excrescences present on aortic valve.. The aortic valve was  ?not well visualized.  Aortic valve regurgitation is trivial. Aortic valve  ?sclerosis is present, with no evidence of aortic valve stenosis.  ? 5. The inferior vena cava is normal in size with greater than 50%  ?respiratory variability, suggesting right atrial pressure of 3 mmHg. ?___________ ? ?LHC 05/12/2021: ?Conclusions: ?Severe two-vessel coronary artery disease with 95% distal RCA stenosis as well as multisegmental distal LAD disease of up to 70% and 90% ostial D2 stenosis. ?Moderate, noncritical mid RCA and proximal LCx disease. ?Patent D1 and distal LAD stents.  The distal LAD stent demonstrates up to 30% eccentric in-stent restenosis. ?Mildly elevated left ventricular filling pressure (LVEDP 15-20 mmHg). ?Successful PCI to distal RCA using Onyx Frontier 3.0 x 15 mm drug-eluting stent with 0% residual stenosis and TIMI-3 flow. ?Coronary vasospasm versus relatively small caliber of right radial artery precluding advancement of 70F guide catheter.  Intervention successfully performed using 57F JR4 guide catheter.  Alternative access may need to be considered for future interventions. ?  ?Recommendations: ?Continue indefinite dual antiplatelet therapy with aspirin and clopidogrel. ?Gentle post catheterization hydration to maintain net even fluid balance given acute kidney injury superimposed on chronic kidney disease and mildly elevated LVEDP during catheterization today. ?Aggressive secondary prevention of coronary artery disease. ? ?Patient Profile  ?   ?51 y.o. female with history of CAD status post prior PCI most recently on 05/12/2021 with chronic angina, HFrEF secondary to ICM with subsequent normalization of LV systolic function in 6659, CKD stage III, GI bleed in 01/2020 requiring "10 units" of pRBC, CVAs, left carotid to subclavian artery bypass with subclavian artery ligation, aortic arch aneurysm, anemia, obesity status post gastric bypass, DM with polyneuropathy and history of hypoglycemia, bipolar disorder, migraine  disorder, HTN, HLD, frequent falls/syncope/near syncope, and recent eye infection s/p surgery on 05/07/2021 who is being seen today for the evaluation of chest pain  ? ?Assessment & Plan  ?  ?1. CAD s/p multiple PCIs most recently on 05/12/2021 to the distal RCA with chronic angina and mildly elevated high sensitivity troponin: ?-Recent PCI to the distal RCA on 05/12/2021 with residual mid RCA and proximal LCx disease  ?-Mildly elevated high sensitivity troponin not consistent with ACS ?-Chest pain has been reproducible to palpation  ?-Continue aspirin, Plavix without interruption along with amlodipine, Imdur, Ranexa, and Coreg ?-Echo with preserved LVSF this admission with no wall motion abnormalities. ?-No ischemic work-up is recommended at the present time.  We will arrange for close follow-up visit in the office. ? ?2. CKD stage II: ?-Stable ? ?3. Anemia of chronic disease: ?-  Stable ? ?4. HTN: ?-Blood pressure has been elevated this admission ?-Just received morning medications, monitor  ? ?5. HLD: ?-LDL 95 in 02/2021 ?-Recommend resuming PTA Crestor ?-Documented myalgias to Lipitor ?-Consider addition of ezetimibe in the office ?-May need to consider PCSK9i ?   ? ?For questions or updates, please contact [place] ?Please consult [email or web address] for contact info under Cardiology/STEMI. ?  ? ?Signed, ?Kathlyn Sacramento, MD ?[facility] ?06/02/2021, 8:50 AM ? ?

## 2021-06-02 NOTE — TOC Transition Note (Signed)
Transition of Care (TOC) - CM/SW Discharge Note ? ? ?Patient Details  ?Name: [facility] ?MRN: [id number] ?Date of Birth: 11-18-1970 ? ?Transition of Care (TOC) CM/SW Contact:  ?Laurena Slimmer, RN ?Phone Number: ?06/02/2021, 1:57 PM ? ? ?Clinical Narrative:    ?Spoke with Judson Roch at [facility] [phone] to advised of patient discharge back home today. TOC signing off. ? ?  ?  ? ? ?Patient Goals and CMS Choice ?  ?  ?  ? ?Discharge Placement ?  ?           ?  ?  ?  ?  ? ?Discharge Plan and Services ?  ?  ?           ?  ?  ?  ?  ?  ?  ?  ?  ?  ?  ? ?Social Determinants of Health (SDOH) Interventions ?  ? ? ?Readmission Risk Interventions ? ?  05/09/2021  ?  2:17 PM 11/07/2019  ?  4:28 PM 10/18/2019  ?  1:55 PM  ?Readmission Risk Prevention Plan  ?Transportation Screening Complete Complete Complete  ?Medication Review Press photographer) Complete Complete Complete  ?PCP or Specialist appointment within 3-5 days of discharge   Complete  ?[place] or Home Care Consult Complete Complete Complete  ?SW Recovery Care/Counseling Consult   Complete  ?Palliative Care Screening Not Applicable Not Applicable Not Applicable  ?[place] Not Applicable Not Applicable Not Applicable  ? ? ? ? ? ?

## 2021-06-03 ENCOUNTER — Telehealth: Payer: Self-pay | Admitting: *Deleted

## 2021-06-03 ENCOUNTER — Telehealth: Payer: Self-pay

## 2021-06-04 ENCOUNTER — Inpatient Hospital Stay: Payer: Medicare Other | Admitting: Physician Assistant

## 2021-06-06 ENCOUNTER — Telehealth: Payer: Self-pay

## 2021-06-06 NOTE — Telephone Encounter (Signed)
Copied from [place] [id number]. Topic: General - Other ?>> Jun 06, 2021  1:24 PM Yvette Rack wrote: ?Reason for CRM: Jacquelyne Balint called to request that patient's death certificate be signed asap because the funeral service is scheduled for Sunday with burial to follow. Cb# [phone] ?

## 2021-06-06 NOTE — Telephone Encounter (Signed)
I am unable to sign because her age on the death certificate is wrong and does not match the age calculated from her DOB and DOD. I am not allowed to change the age listed on certificate, it can only be corrected funeral home.  ?

## 2021-06-11 NOTE — Telephone Encounter (Signed)
The [facility] State Records computer system would not allow me to sign it due to an error on the record that was sent to me. I sent it back to the funeral home last weekend, but it's just now  been corrected and signed today.   ?

## 2021-06-11 NOTE — Telephone Encounter (Signed)
Spoke with [name] and informed.  He was already aware and expressed appreciation.   ?

## 2021-06-11 NOTE — Telephone Encounter (Signed)
Pts son called in stating he is really needing PCP to give him a call about this situation, it is very urgent, so they can get the patient buried, please advise.  ?

## 2021-06-11 NOTE — Telephone Encounter (Signed)
Ysidro Evert called to request that patient's death certificate be signed asap. Ysidro Evert stated pt family is threatening with a lawsuit. ? ?I made Ysidro Evert aware of the message from Dr. Caryn Section below, and Ysidro Evert stated the provider filled out documents but did not check the signature box. ? ? ? ? ?Ysidro Evert is requesting a call back today . ? ?[phone] ?

## 2021-06-11 NOTE — Telephone Encounter (Signed)
Caller does not understand why this will not be signed, caller stated the [facility] system automatically calculates everything, caller also stated the family has already gone to the News to report this issue.  ?

## 2021-06-13 ENCOUNTER — Inpatient Hospital Stay: Payer: Medicare Other | Admitting: Family Medicine

## 2021-06-17 ENCOUNTER — Other Ambulatory Visit: Payer: Self-pay | Admitting: Gastroenterology

## 2021-06-20 ENCOUNTER — Ambulatory Visit: Payer: Medicare Other | Admitting: Cardiovascular Disease

## 2021-06-22 ENCOUNTER — Other Ambulatory Visit: Payer: Self-pay | Admitting: Cardiovascular Disease

## 2021-07-03 NOTE — Telephone Encounter (Signed)
Pt is currently at [facility]-  ?

## 2021-07-03 NOTE — Telephone Encounter (Signed)
Received call from Sgt. Sharlene Motts at [facility]. Pt found deceased in home. Alerted PCP on call Dr. Ancil Boozer. Information provided. States she will call Sgt.Sharlene Motts, Number provided.  ?

## 2021-07-03 DEATH — deceased

## 2021-07-20 IMAGING — CR DG HAND COMPLETE 3+V*R*
3 series · 3 of 3 positions shown · non-contrast
Comparison: None.

CLINICAL DATA: Status post fall yesterday with right hand swelling
and pain.

EXAM:
RIGHT HAND - COMPLETE 3+ VIEW

[hand ap]
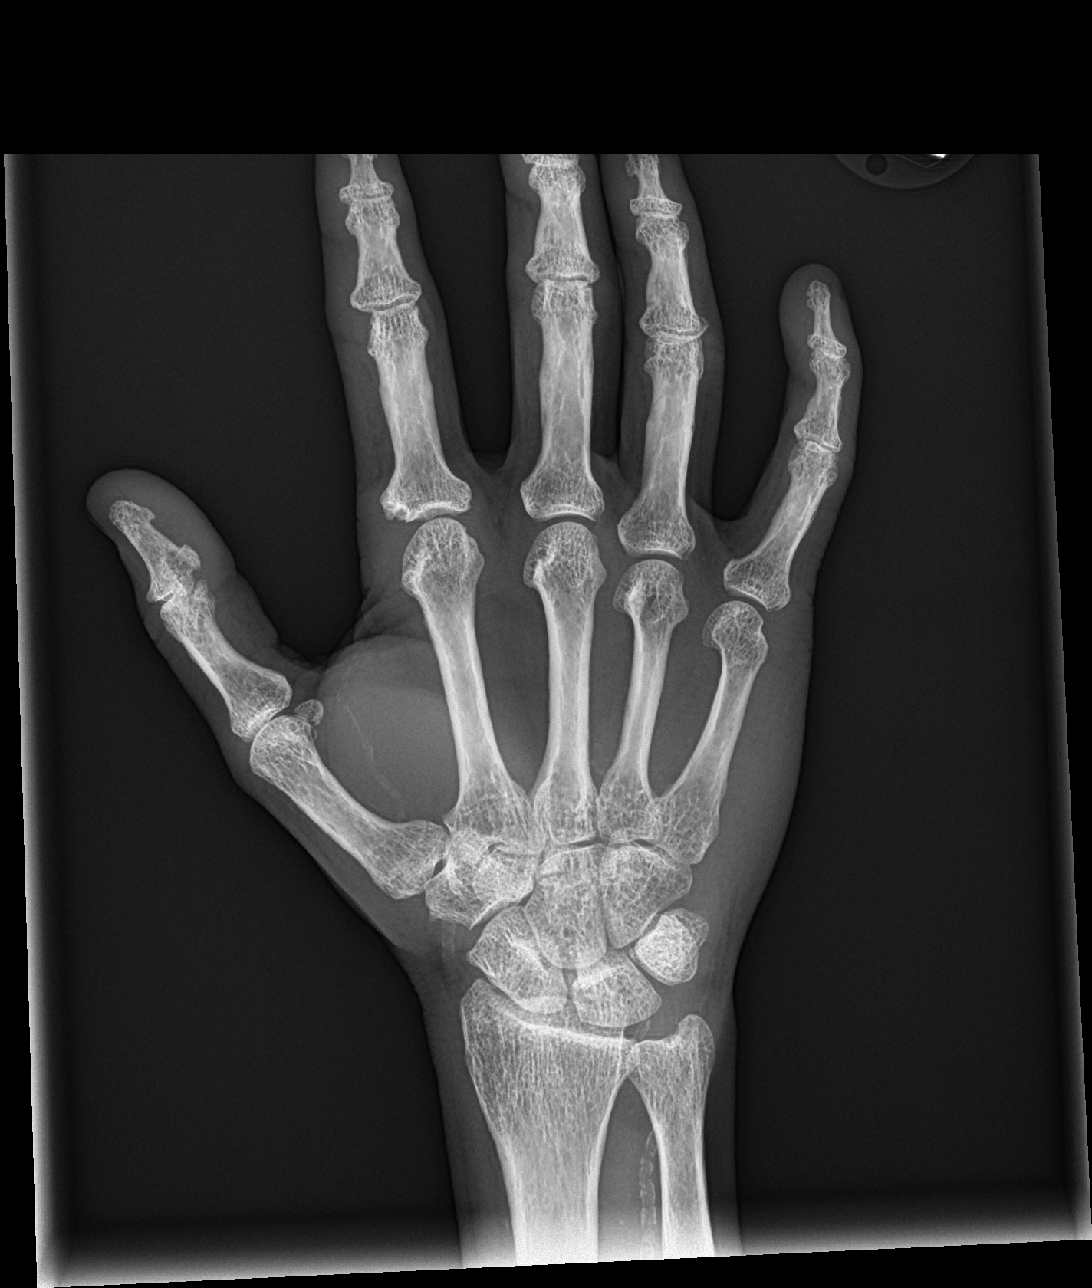

[hand obl]
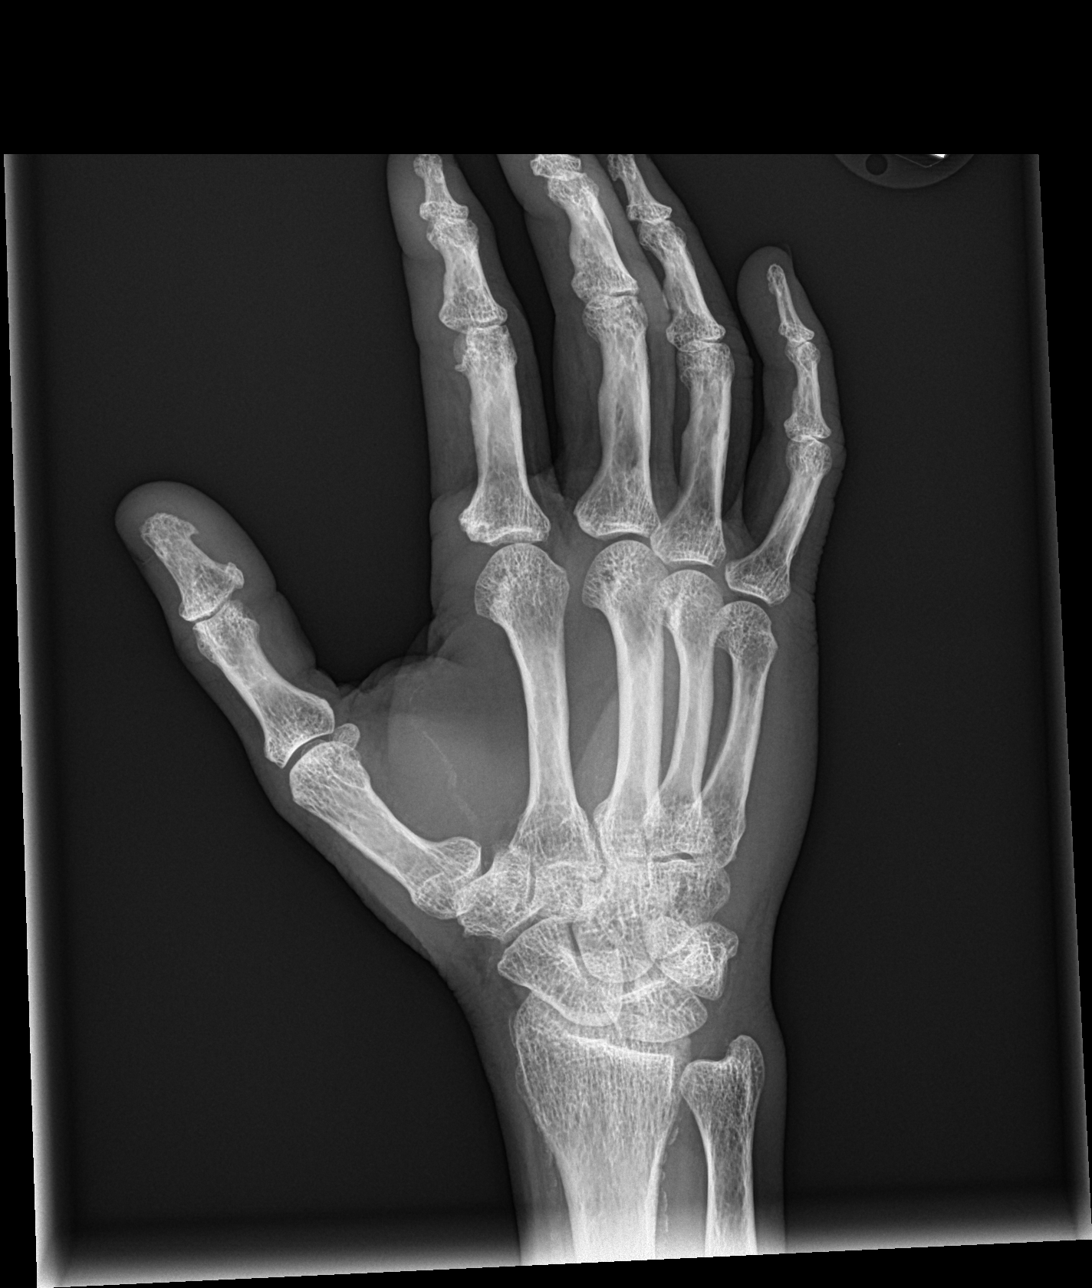

[hand lat]
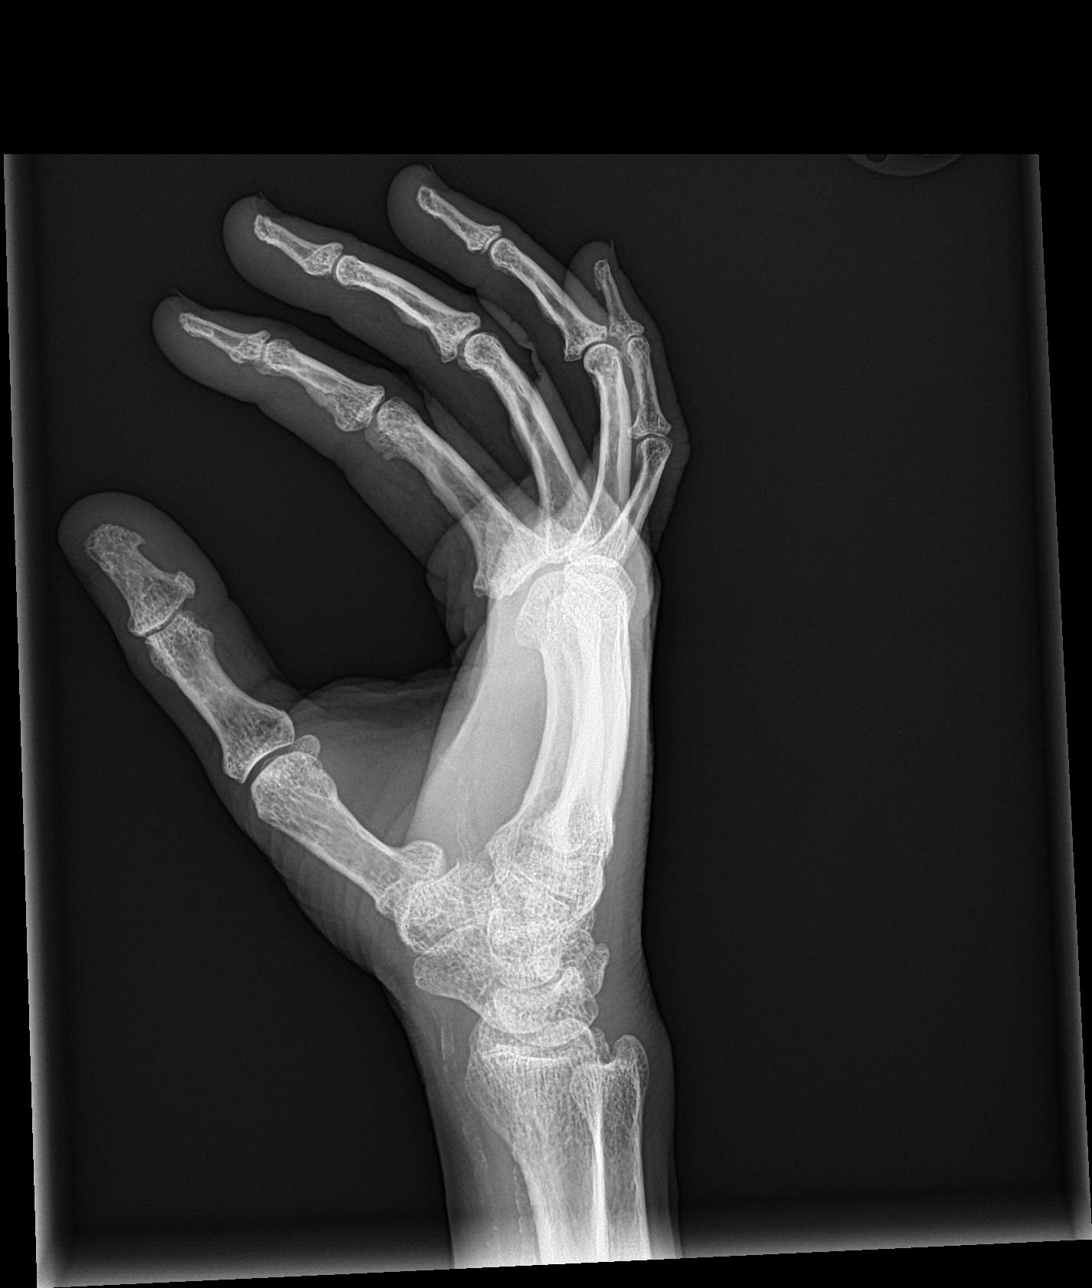

[3 of 3 positions shown; findings below may reference images not displayed]

FINDINGS: There is no evidence of fracture or dislocation. Soft tissues are
unremarkable.
IMPRESSION: No acute fracture or dislocation.

## 2021-07-20 IMAGING — CR DG WRIST COMPLETE 3+V*R*
4 series · 4 of 4 positions shown · non-contrast
Comparison: None.

CLINICAL DATA: Status post fall yesterday with right hand swelling
and pain.

EXAM:
RIGHT WRIST - COMPLETE 3+ VIEW

[wrist pa]
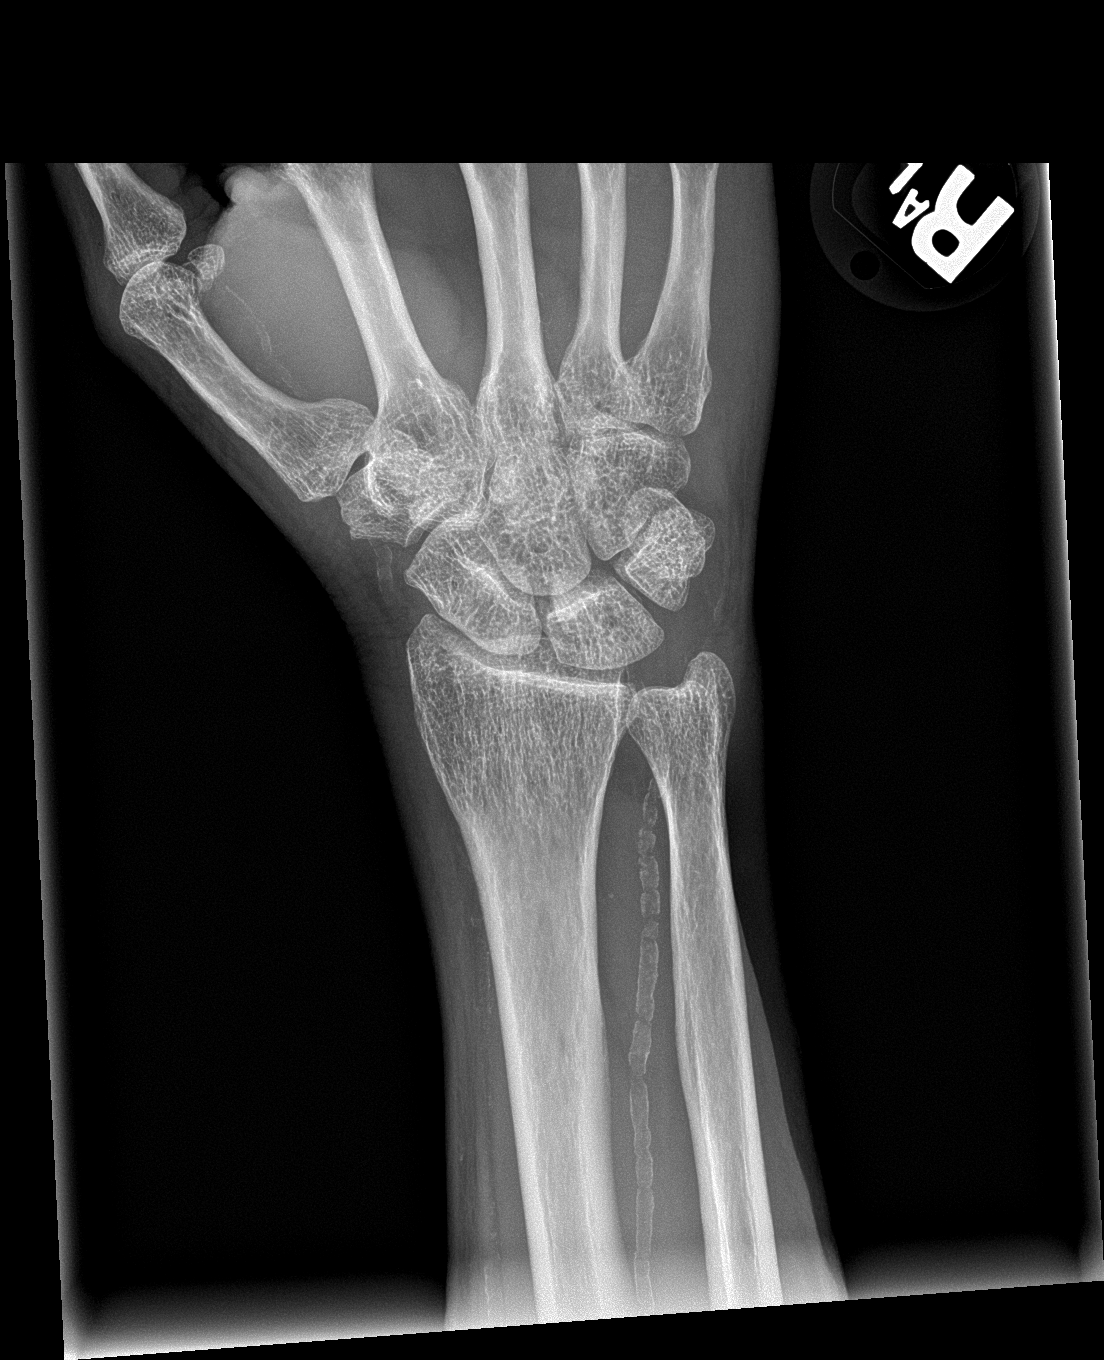

[wrist obl]
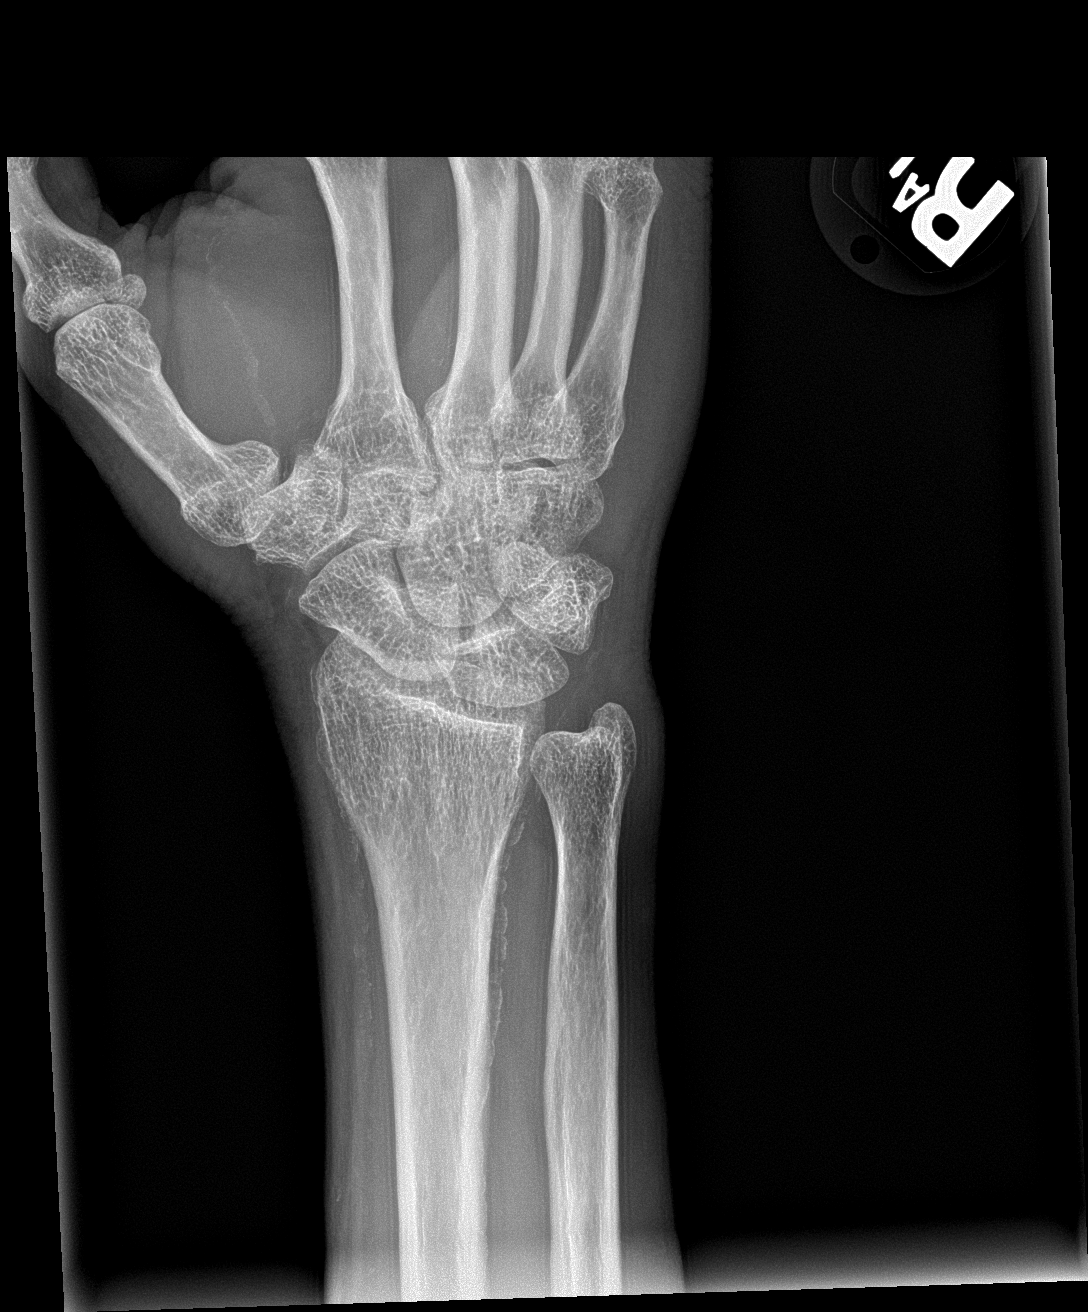

[wrist lat]
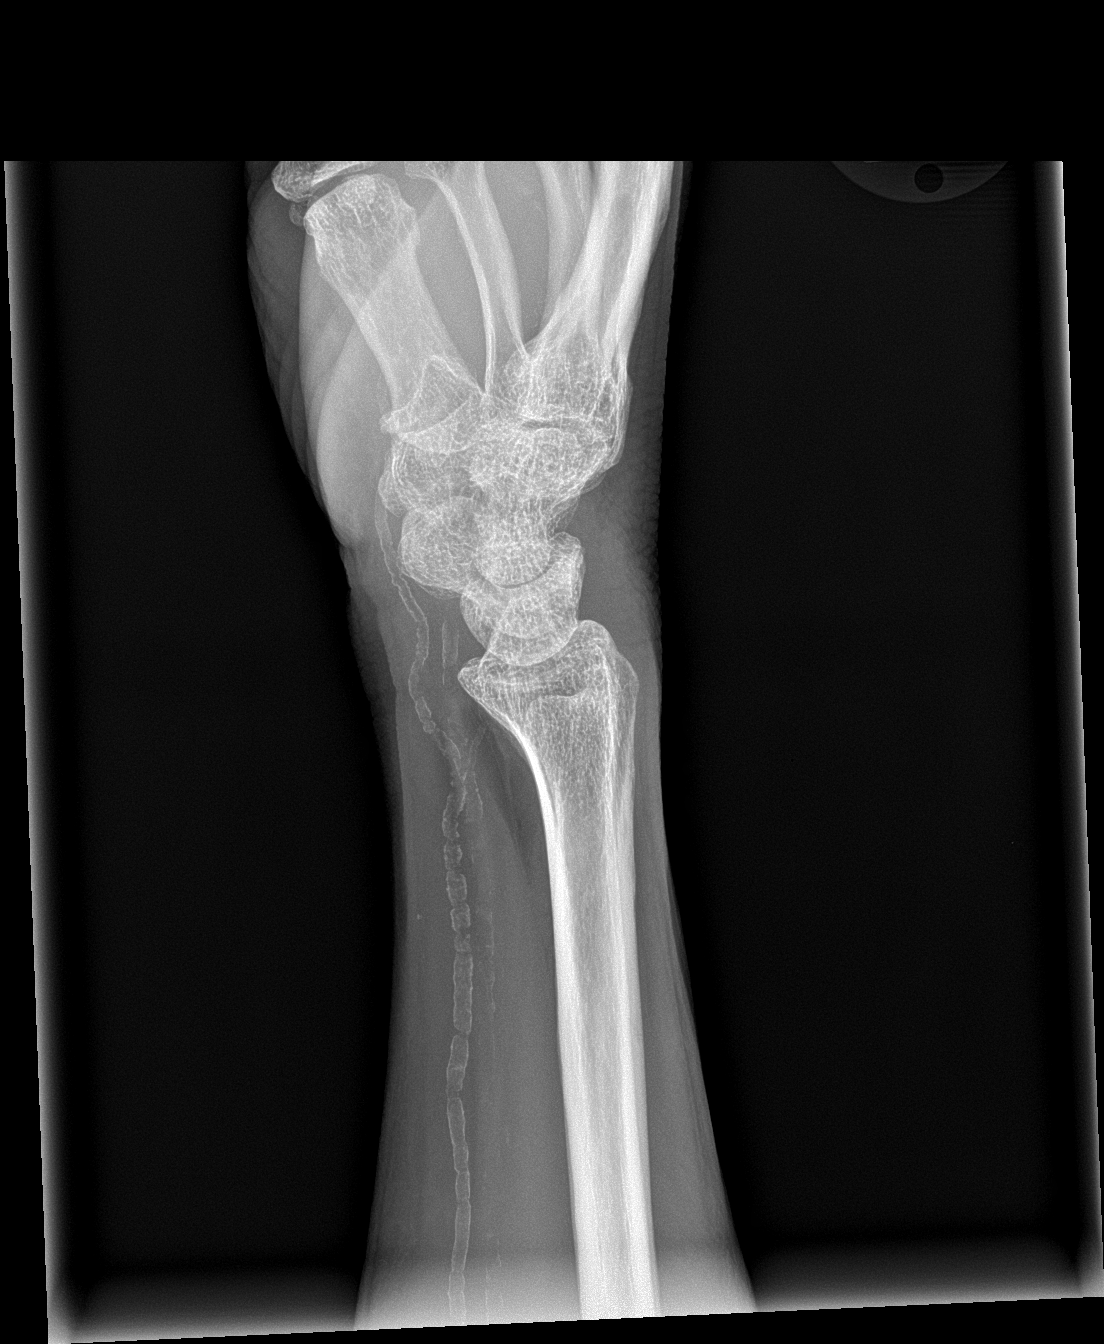

[wrist navicular]
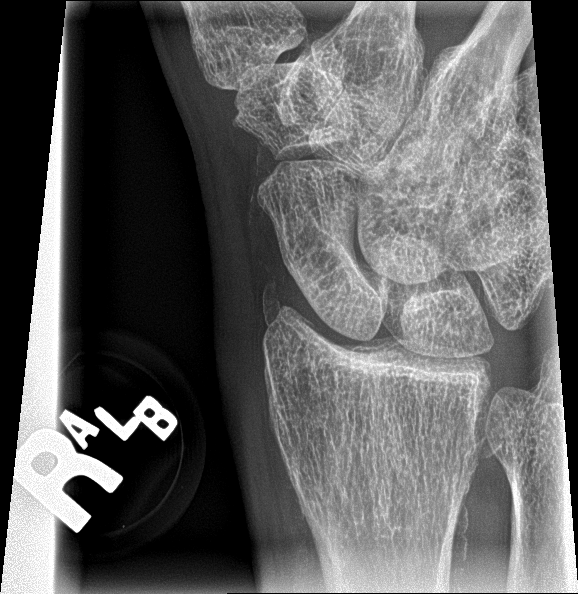

[4 of 4 positions shown; findings below may reference images not displayed]

FINDINGS: There is no evidence of fracture or dislocation. Soft tissues are
unremarkable.
IMPRESSION: No acute fracture or dislocation noted.

## 2021-07-29 ENCOUNTER — Ambulatory Visit: Payer: Medicare Other | Admitting: Neurology

## 2021-08-14 IMAGING — CR DG CHEST 2V
2 series · 2 of 2 positions shown · non-contrast
Comparison: April 18, 2019

CLINICAL DATA: Chest pain

EXAM:
CHEST - 2 VIEW

[chest lat]
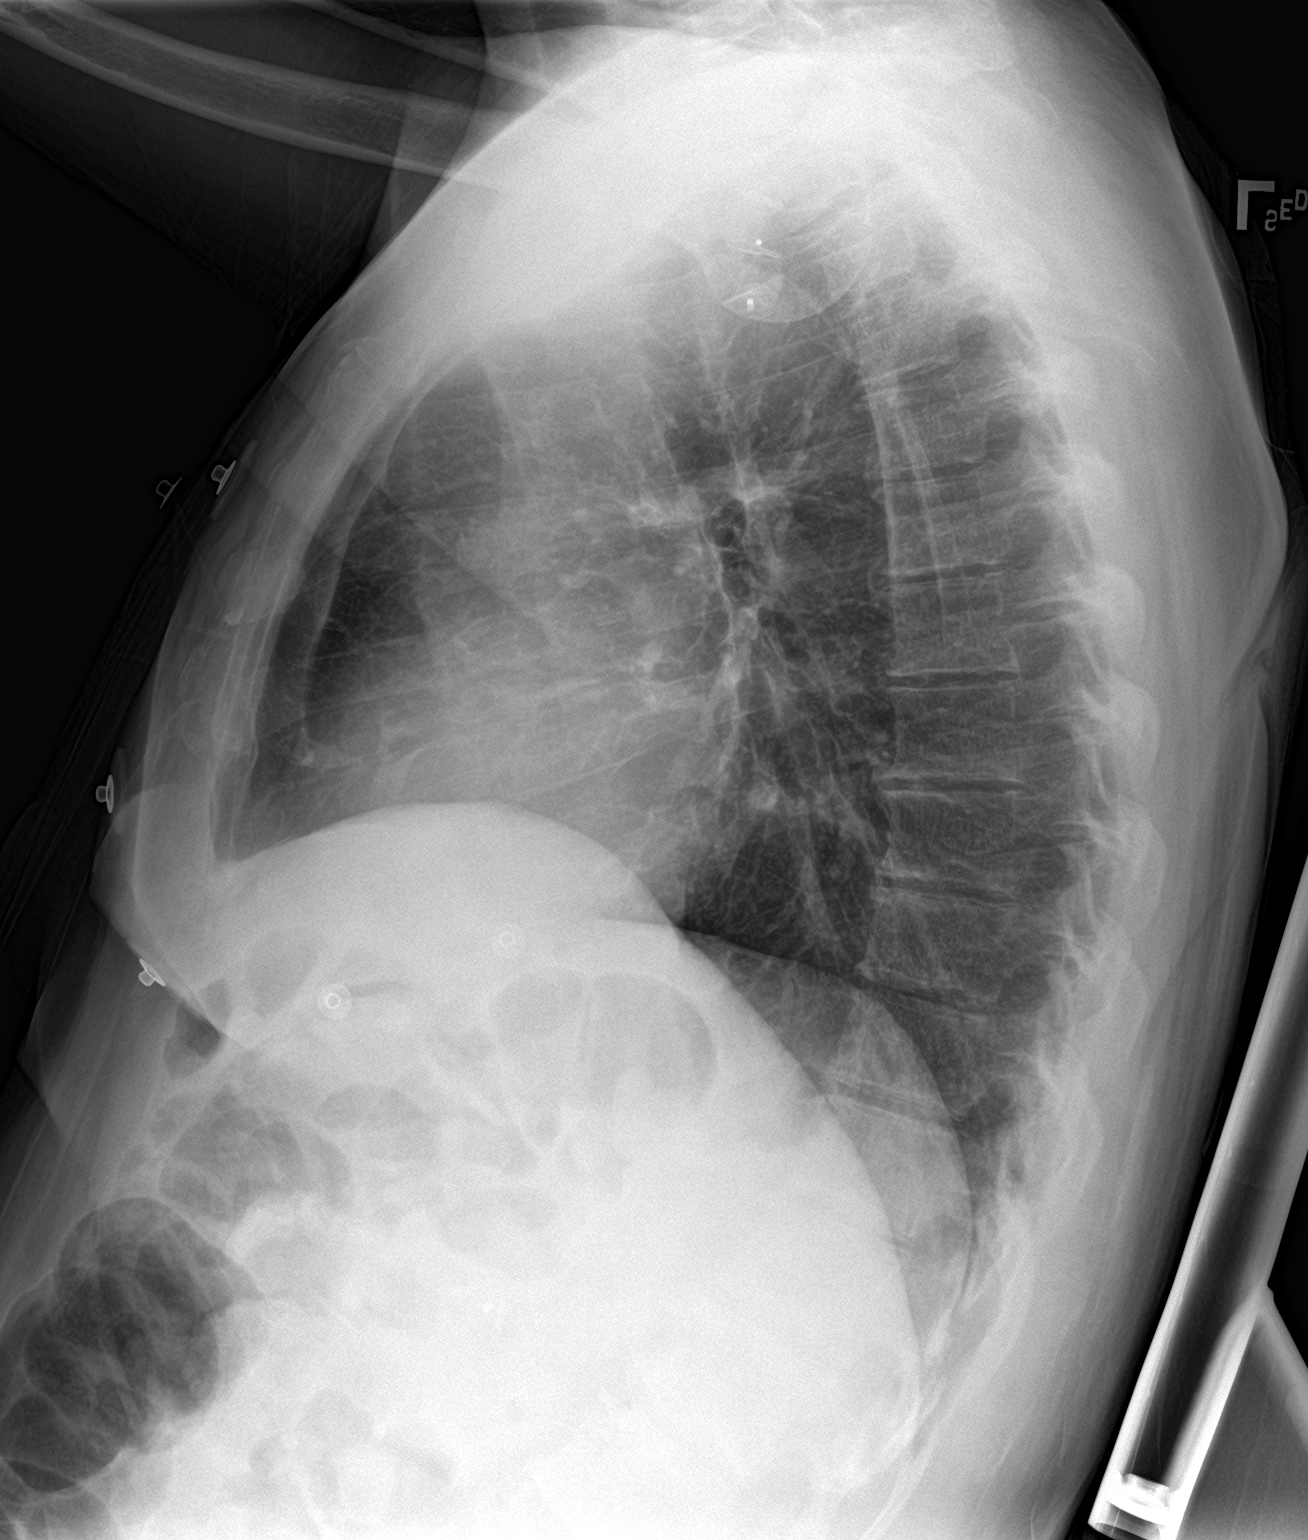

[chest ap]
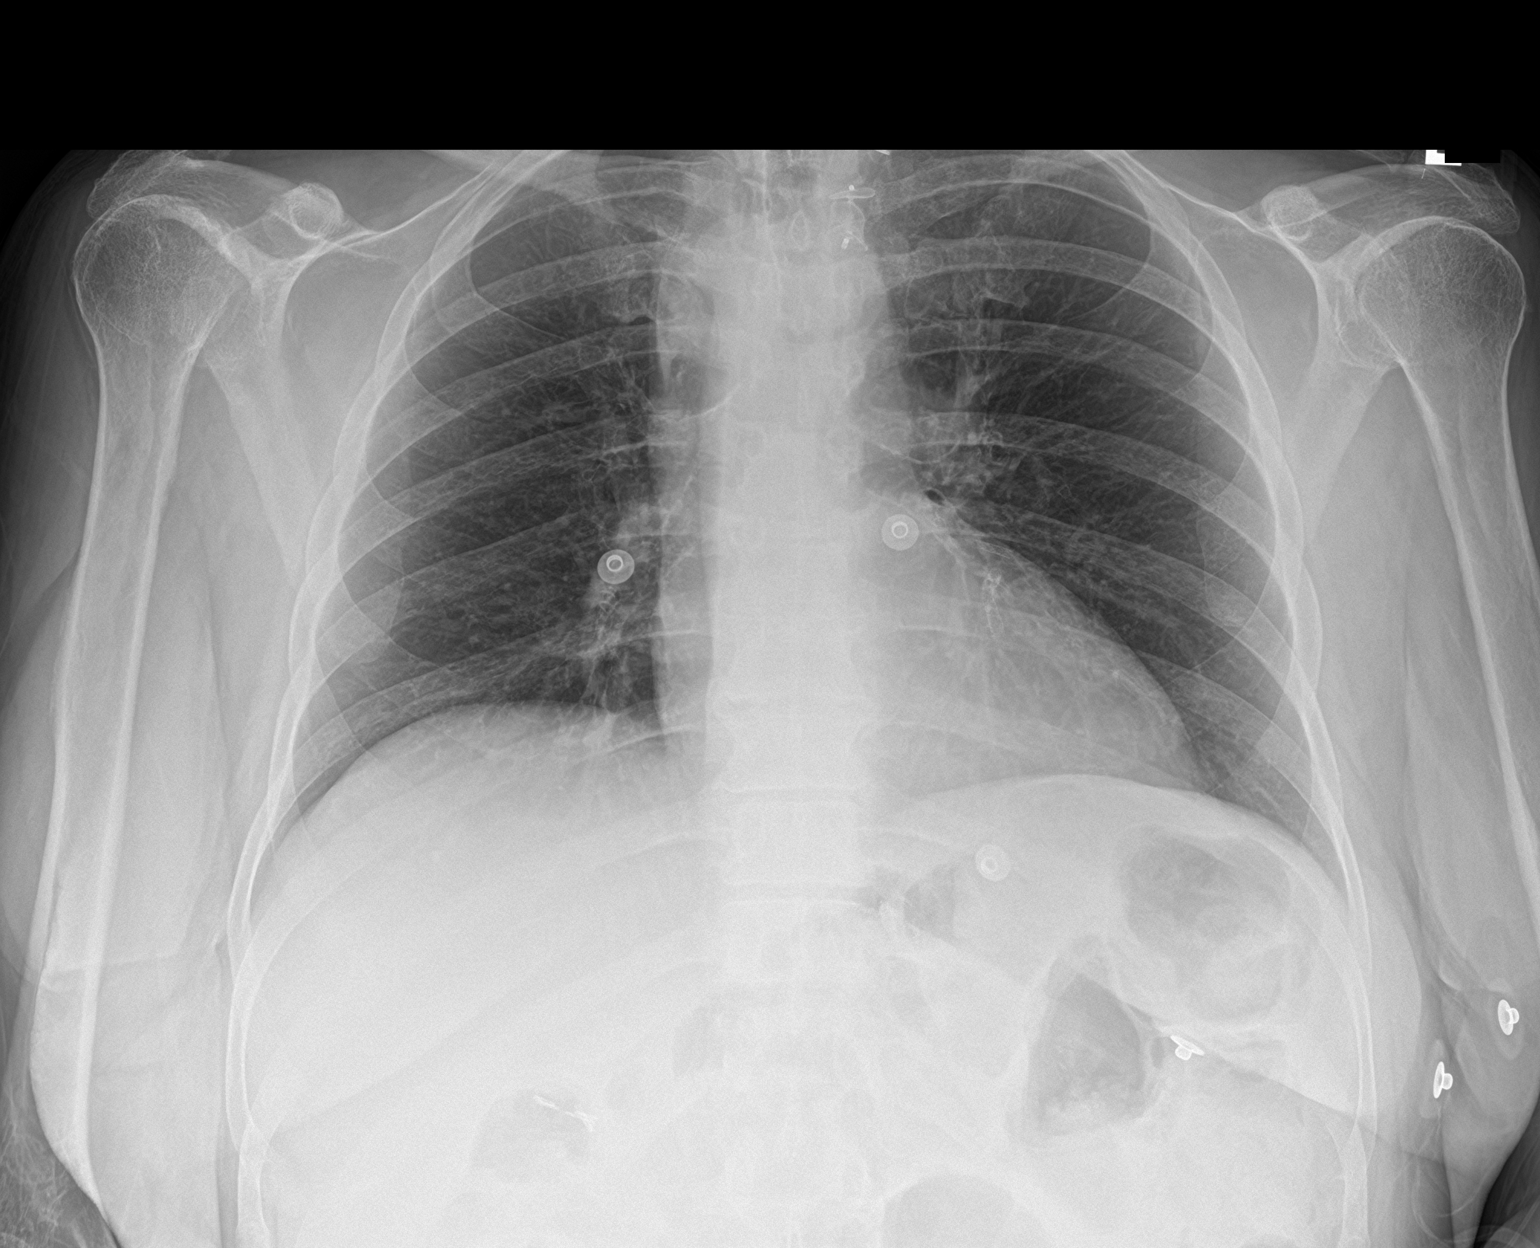

[2 of 2 positions shown; findings below may reference images not displayed]

FINDINGS: Lungs are clear. Heart size and pulmonary vascularity are normal.
Right-sided aortic arch noted. Postoperative change in the upper
left paratracheal region noted. No adenopathy. No bone lesions.
IMPRESSION: Right-sided aortic arch.  Heart size normal.  Lungs clear.

## 2021-08-18 IMAGING — MR MR ABDOMEN WO/W CM
19 of 20 series · 45 of 48 positions shown · IV contrast (gadavist)
Comparison: 03/19/2017 CT abdomen/pelvis.

CLINICAL DATA: Inpatient. Recurrent hypoglycemia. Concern for
insulinoma.

EXAM:
MRI ABDOMEN WITHOUT AND WITH CONTRAST
TECHNIQUE: Multiplanar multisequence MR imaging of the abdomen was performed
both before and after the administration of intravenous contrast.
CONTRAST:  6mL GADAVIST GADOBUTROL 1 MMOL/ML IV SOLN

[Series 2: cor haste · coronal · 6.0mm · 1.19mm/px · 1 of 32 slices shown]
[im 1/32]
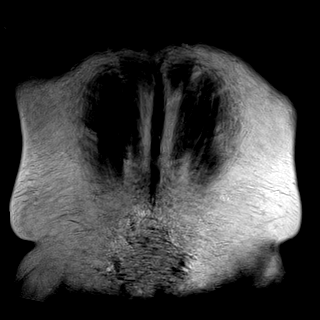

[Series 3: ax haste · axial · 6.0mm · 1.19mm/px · 1 of 36 slices shown]
[im 1/36]
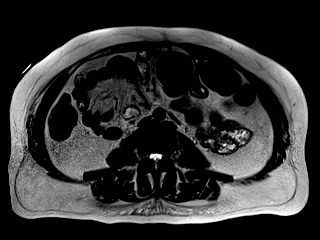

[Series 6: T2 fat-sat · axial · 6.0mm · 1.19mm/px · 1 of 36 slices shown]
[im 1/36]
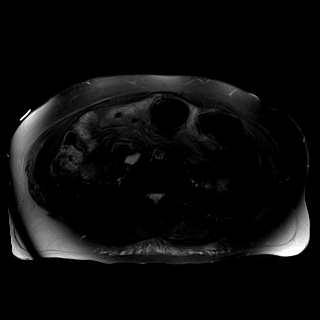

[Series 7: ax dwi_tracew · axial · 6.0mm · 1.42mm/px · z∈[-70,+182]mm · 4 of 108 slices shown]
[im 1/108]
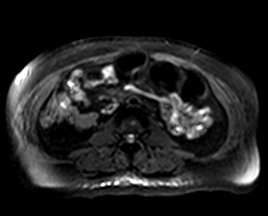
[im 36/108]
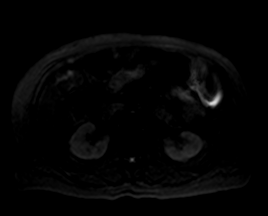
[im 72/108]
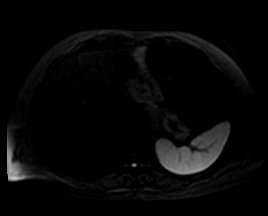
[im 108/108]
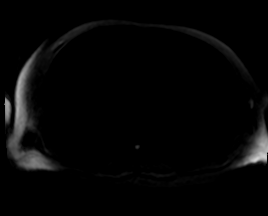

[Series 8: ax dwi_adc · axial · 6.0mm · 1.42mm/px · 1 of 36 slices shown]
[im 1/36]
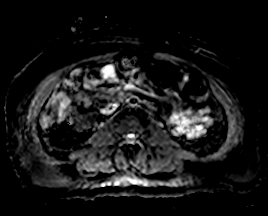

[Series 9: ax in & · axial · 3.5mm · 1.19mm/px · z∈[-72,+177]mm · 6 of 144 slices shown]
[im 1/144]
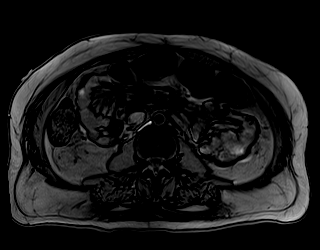
[im 29/144]
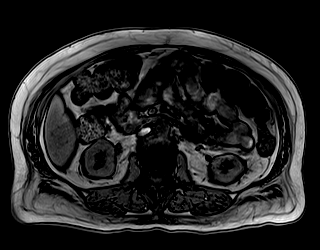
[im 58/144]
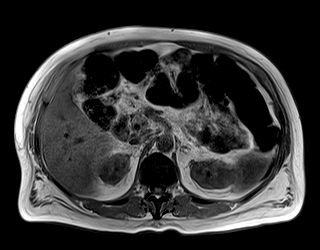
[im 86/144]
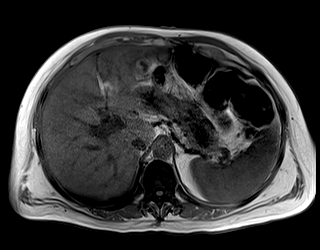
[im 115/144]
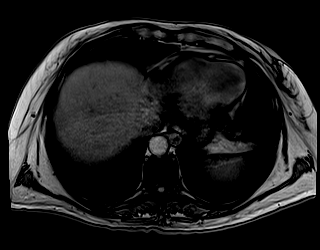
[im 144/144]
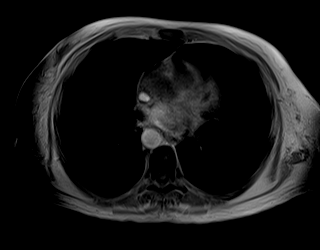

[Series 13: t2_space_cor_cs24_bh_384 · coronal · 1.3mm · 0.49mm/px · 2 of 62 slices shown]
[im 1/62]
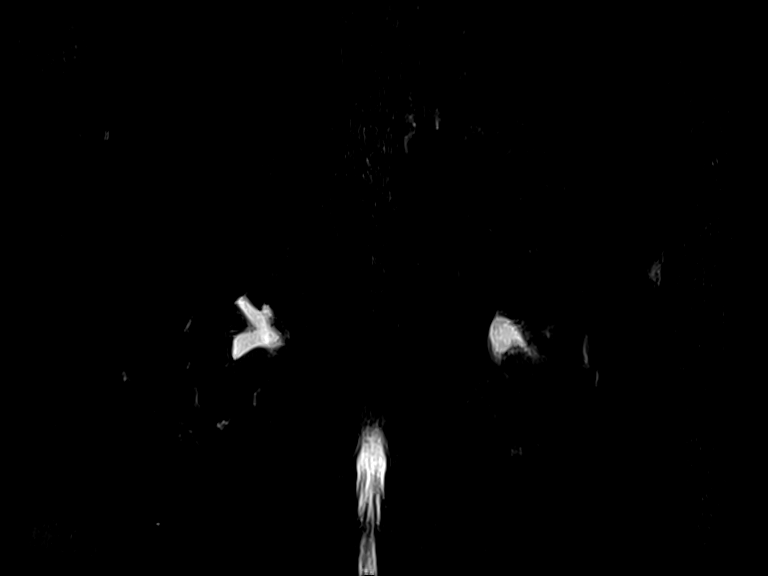
[im 62/62]
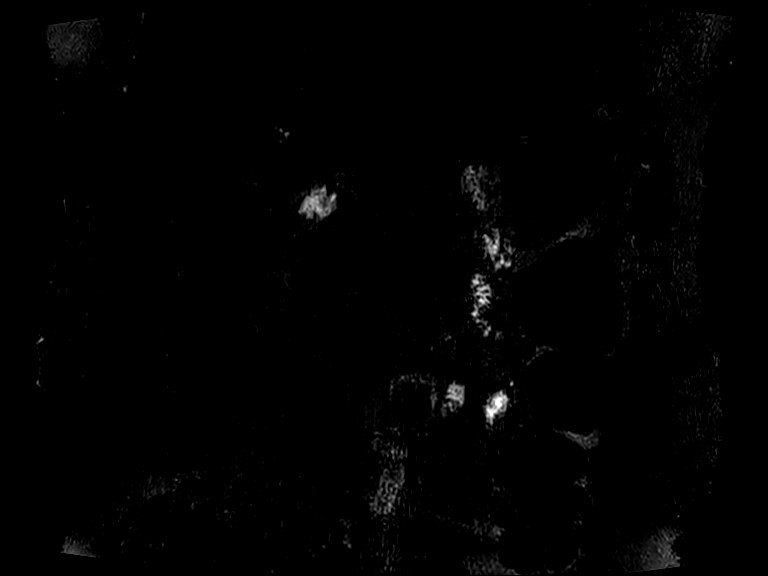

[Series 16: bSSFP · axial · 6.0mm · 0.74mm/px · 1 of 36 slices shown]
[im 1/36]
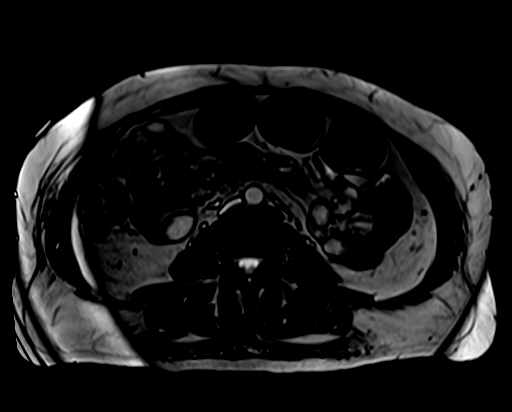

[Series 18: T1 dynamic fat-sat · axial · non-contrast · 3.5mm · 1.19mm/px · z∈[-69,+179]mm · 3 of 72 slices shown]
[im 1/72]
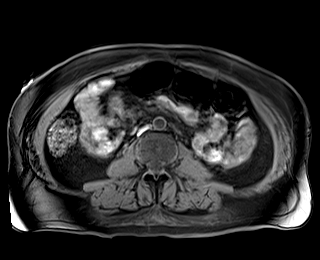
[im 36/72]
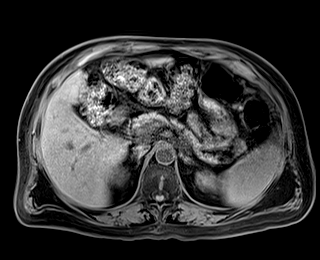
[im 72/72]
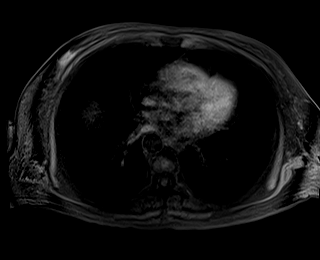

[Series 19: T1 dynamic fat-sat post-contrast · axial · 3.5mm · 1.19mm/px · z∈[-69,+179]mm · 3 of 72 slices shown (1 of 8)]
[im 1/72]
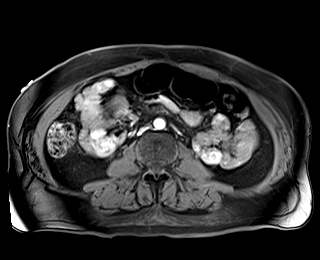
[im 36/72]
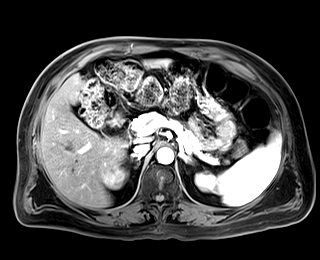
[im 72/72]
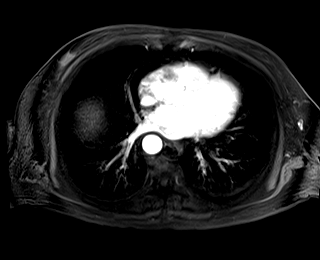

[Series 20: T1 dynamic fat-sat post-contrast · axial · 3.5mm · 1.19mm/px · z∈[-69,+179]mm · 3 of 72 slices shown (2 of 8)]
[im 1/72]
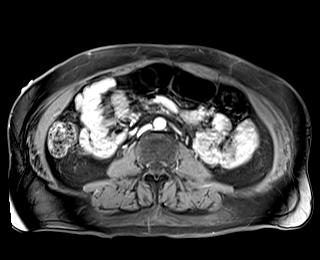
[im 36/72]
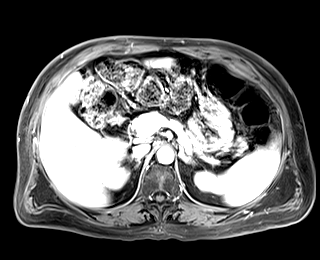
[im 72/72]
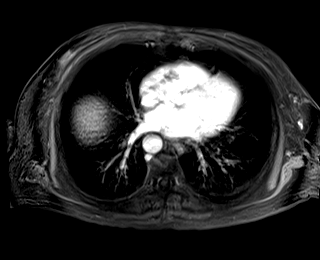

[Series 21: T1 dynamic fat-sat post-contrast · axial · 3.5mm · 1.19mm/px · z∈[-69,+179]mm · 3 of 72 slices shown (3 of 8)]
[im 1/72]
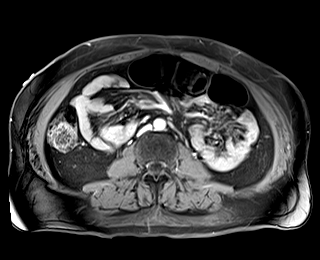
[im 36/72]
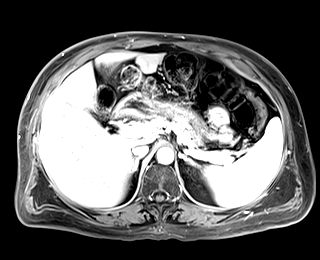
[im 72/72]
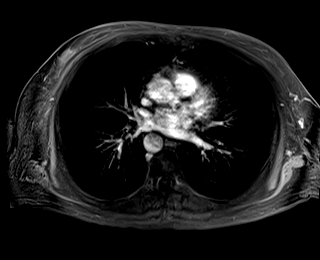

[Series 22: T1 dynamic post-contrast · coronal · 4.0mm · 1.31mm/px · 2 of 60 slices shown]
[im 1/60]
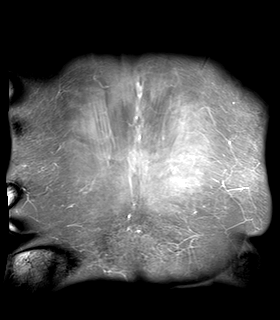
[im 60/60]
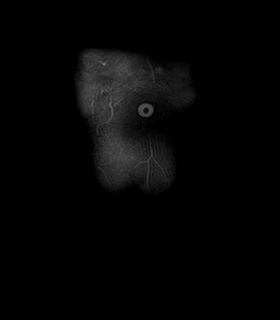

[Series 23: T1 dynamic fat-sat post-contrast · axial · 3.5mm · 1.19mm/px · z∈[-69,+179]mm · 3 of 72 slices shown (4 of 8)]
[im 1/72]
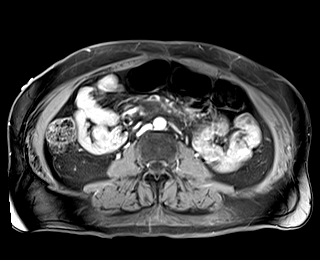
[im 36/72]
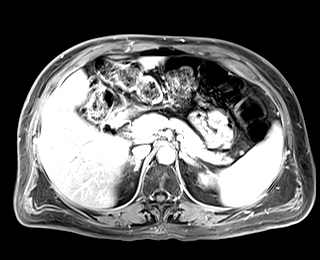
[im 72/72]
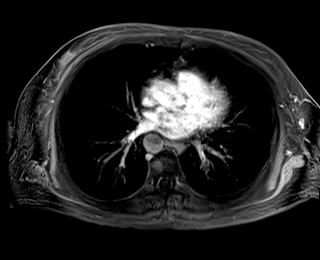

[Series 1025: h-f · axial · 0.5mm · 0.43mm/px · 1 of 15 slices shown]
[im 1/15]
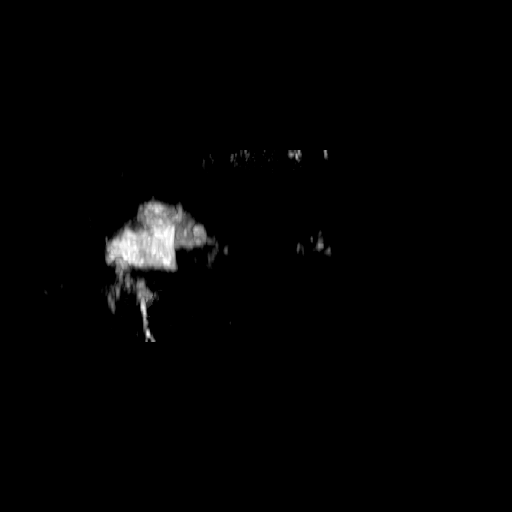

[Series 1037: T1 dynamic fat-sat post-contrast · axial · 3.5mm · 1.19mm/px · z∈[-69,+179]mm · 3 of 72 slices shown (5 of 8)]
[im 1/72]
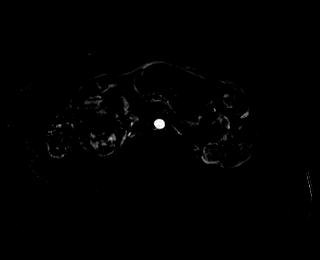
[im 36/72]
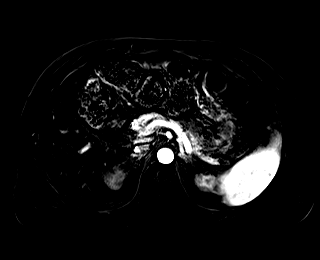
[im 72/72]
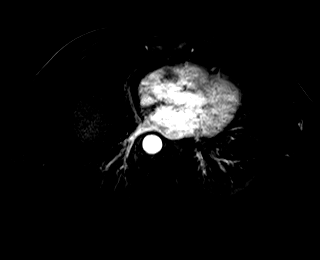

[Series 1041: T1 dynamic fat-sat post-contrast · axial · 3.5mm · 1.19mm/px · z∈[-69,+179]mm · 3 of 72 slices shown (6 of 8)]
[im 1/72]
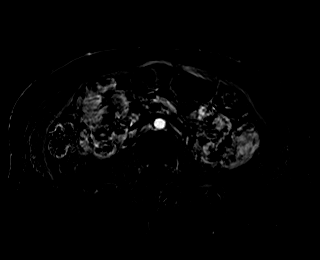
[im 36/72]
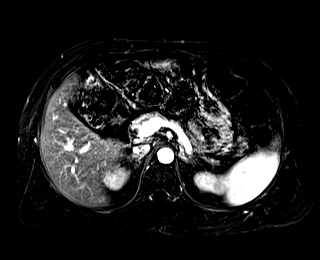
[im 72/72]
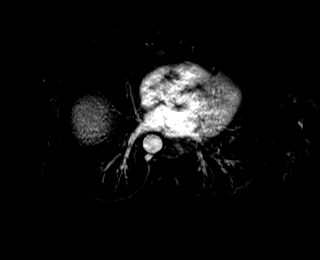

[Series 1046: T1 dynamic fat-sat post-contrast · axial · 3.5mm · 1.19mm/px · z∈[-69,+179]mm · 3 of 72 slices shown (7 of 8)]
[im 1/72]
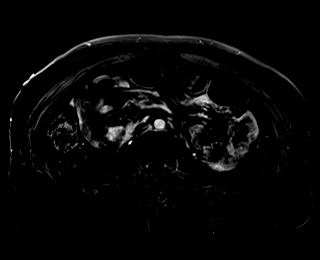
[im 36/72]
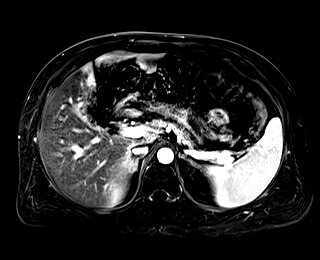
[im 72/72]
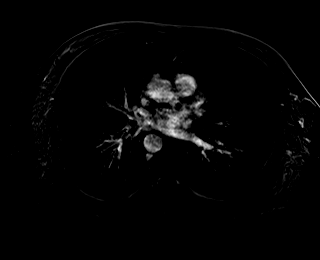

[Series 1052: T1 dynamic fat-sat post-contrast · axial · 3.5mm · 1.19mm/px · 1 of 72 slices shown (8 of 8)]
[im 1/72]
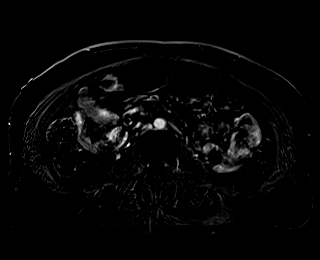

[45 of 48 positions shown; findings below may reference images not displayed]

FINDINGS: Lower chest: No acute abnormality at the lung bases.

Hepatobiliary: Normal liver size and configuration. No hepatic
steatosis. No liver mass. Cholecystectomy. No biliary ductal
dilatation. Common bile duct diameter 5 mm. No choledocholithiasis.
No biliary masses, strictures or beading.

Pancreas: No pancreatic mass or duct dilation. No foci of pancreatic
hyperenhancement. No pancreas divisum.

Spleen: Normal size. No mass.

Adrenals/Urinary Tract: Normal adrenals. No hydronephrosis. Normal
kidneys with no renal mass.

Stomach/Bowel: Expected postsurgical changes from Roux-en-Y gastric
bypass surgery with gastrojejunostomy and collapsed excluded distal
stomach with no acute gastric abnormality. Visualized small and
large bowel is normal caliber, with no bowel wall thickening.

Vascular/Lymphatic: Normal caliber abdominal aorta. Patent portal,
splenic, hepatic and renal veins. No pathologically enlarged lymph
nodes in the abdomen.

Other: No abdominal ascites or focal fluid collection.

Musculoskeletal: No aggressive appearing focal osseous lesions.
IMPRESSION: 1. No evidence of pancreatic mass or other pathologic mass in the
abdomen.
2. Expected postsurgical changes from Roux-en-Y gastric bypass
surgery.
3. Cholecystectomy. No biliary ductal dilatation. No
choledocholithiasis.

## 2021-08-21 IMAGING — DX DG CHEST 1V PORT
1 series · 1 of 1 positions shown · non-contrast
Comparison: 04/18/2019.  10/12/2018.

CLINICAL DATA: Hypoglycemia.  Shortness of breath.

EXAM:
PORTABLE CHEST 1 VIEW

[chest ap]
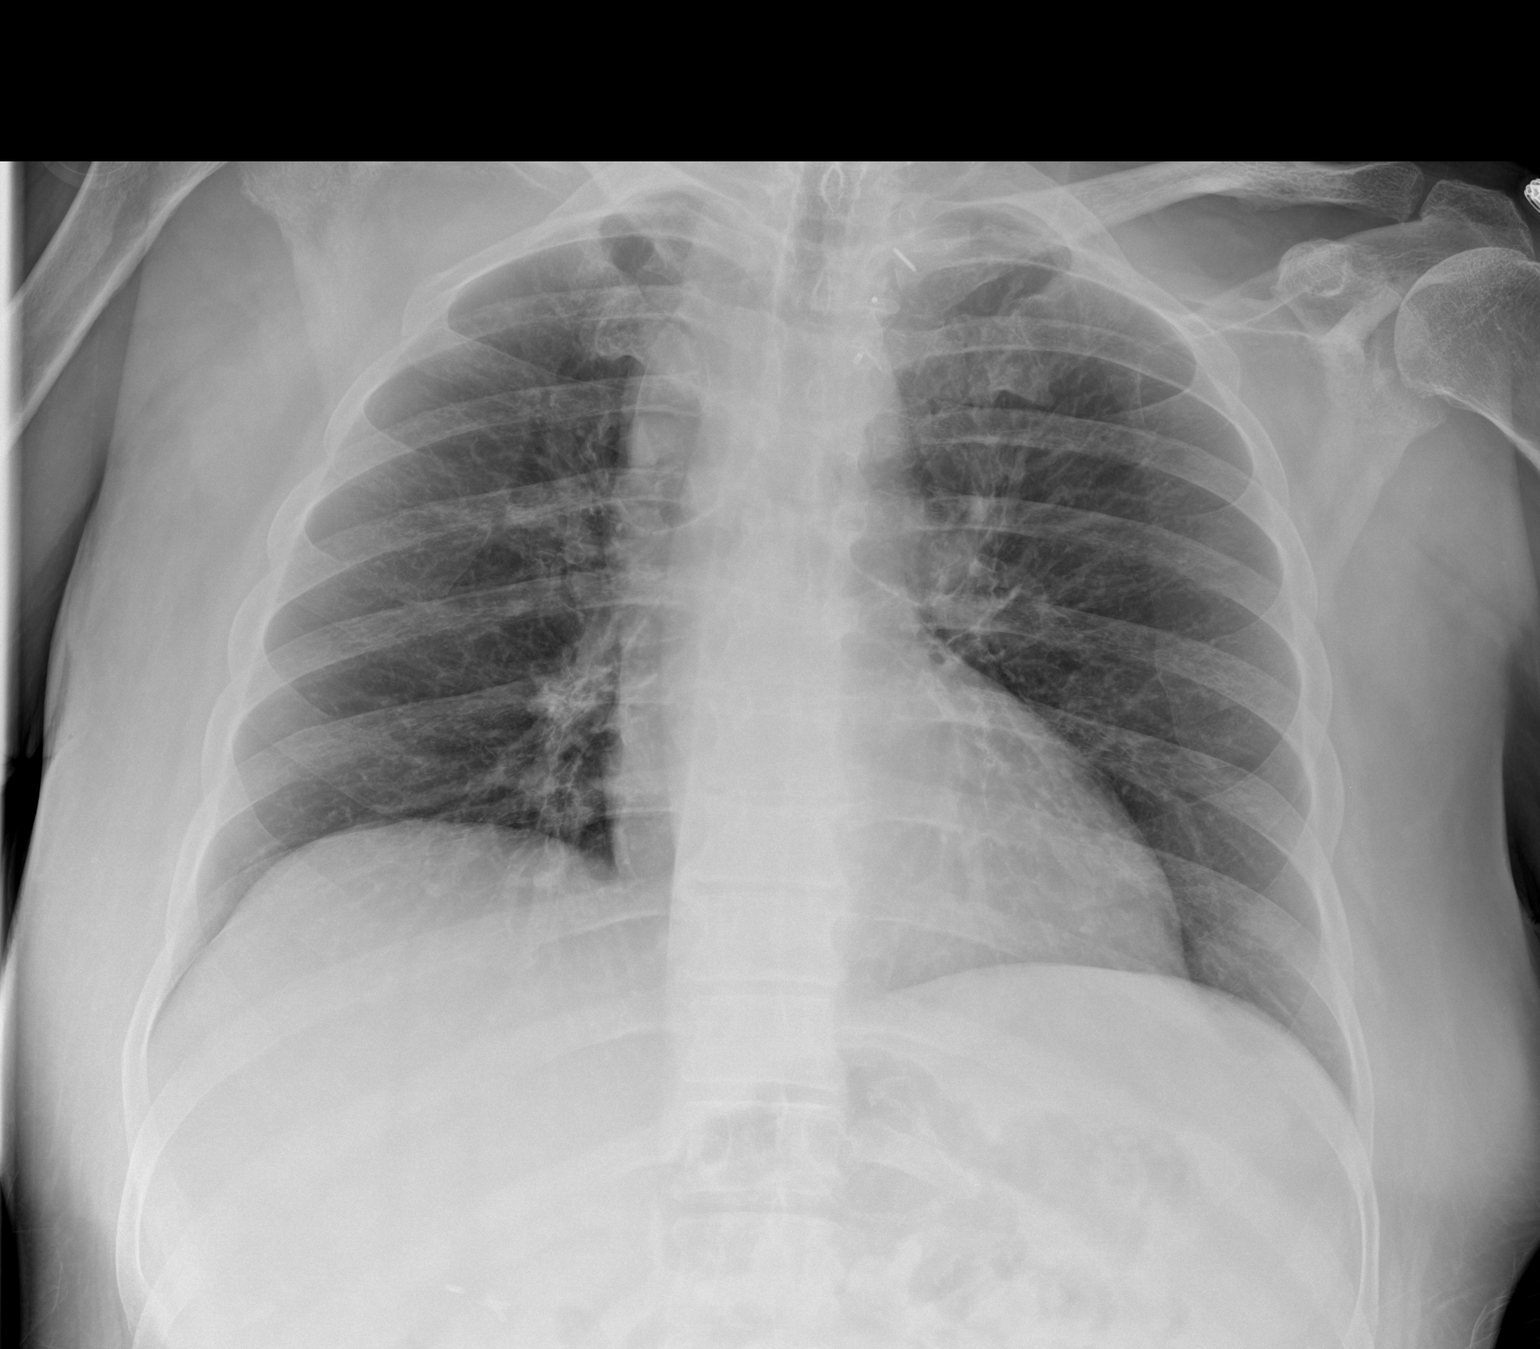

[1 of 1 positions shown; findings below may reference images not displayed]

FINDINGS: Mediastinum and hilar structures normal. Heart size stable.
Right-sided aortic arch again noted. No pulmonary venous congestion.
Low lung volumes. Mild right base subsegmental atelectasis. No focal
alveolar infiltrate. No pleural effusion or pneumothorax. Surgical
clips noted over the left upper chest. Degenerative change thoracic
spine.
IMPRESSION: 1.  Mild right base subsegmental atelectasis.

2. Right-sided aortic arch again noted. Heart size stable. No
pulmonary venous congestion.

## 2021-08-28 ENCOUNTER — Ambulatory Visit: Payer: Medicare Other | Admitting: Internal Medicine

## 2021-09-30 IMAGING — DX DG CHEST 1V
1 series · 1 of 1 positions shown · non-contrast
Comparison: 09/08/2019, CT 01/19/2019

CLINICAL DATA: Chest pain

EXAM:
CHEST  1 VIEW

[chest ap]
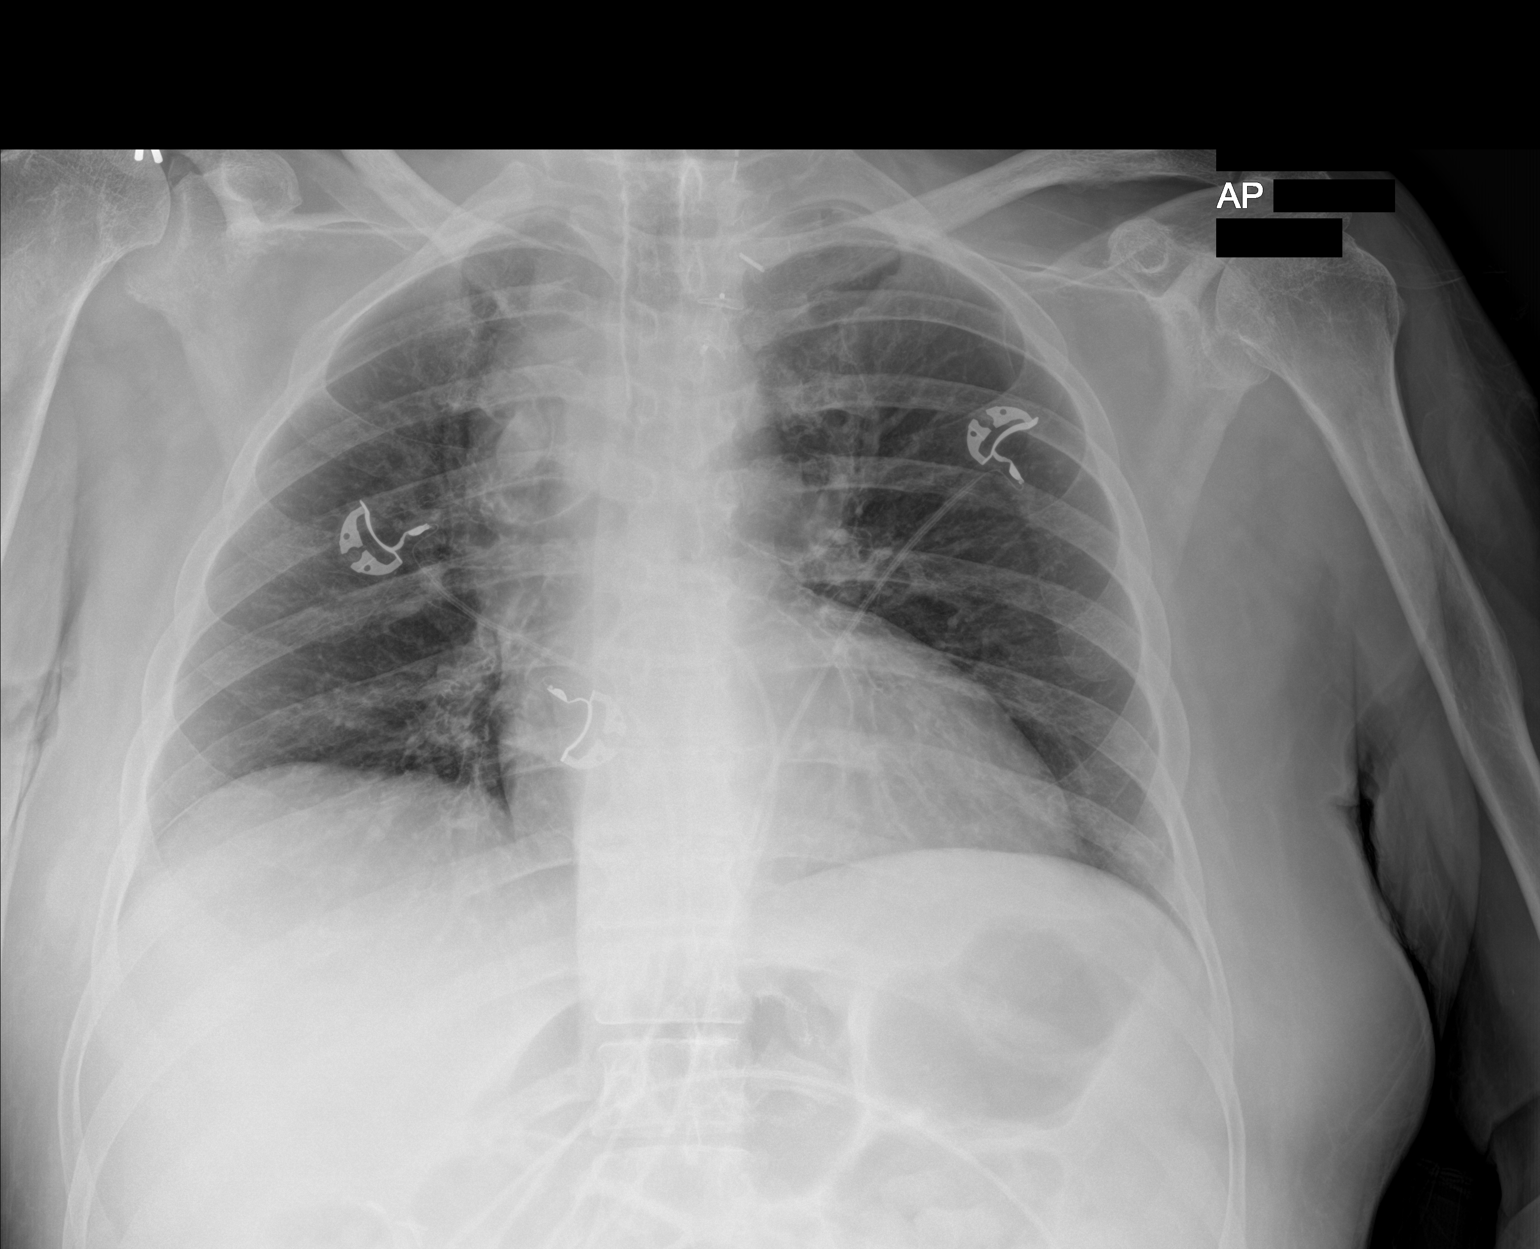

[1 of 1 positions shown; findings below may reference images not displayed]

FINDINGS: No focal opacity or pleural effusion. Stable cardiomediastinal
silhouette with right-sided aortic arch. No pneumothorax. Clips in
the upper mediastinum.
IMPRESSION: No active disease. Right-sided aortic arch.

## 2021-10-18 IMAGING — DX DG CHEST 1V PORT
1 series · 1 of 1 positions shown · non-contrast
Comparison: Portable chest 10/18/2019 and earlier.

CLINICAL DATA: 49-year-old female with chest pain diaphoresis and
lethargy.

EXAM:
PORTABLE CHEST 1 VIEW

[chest ap]
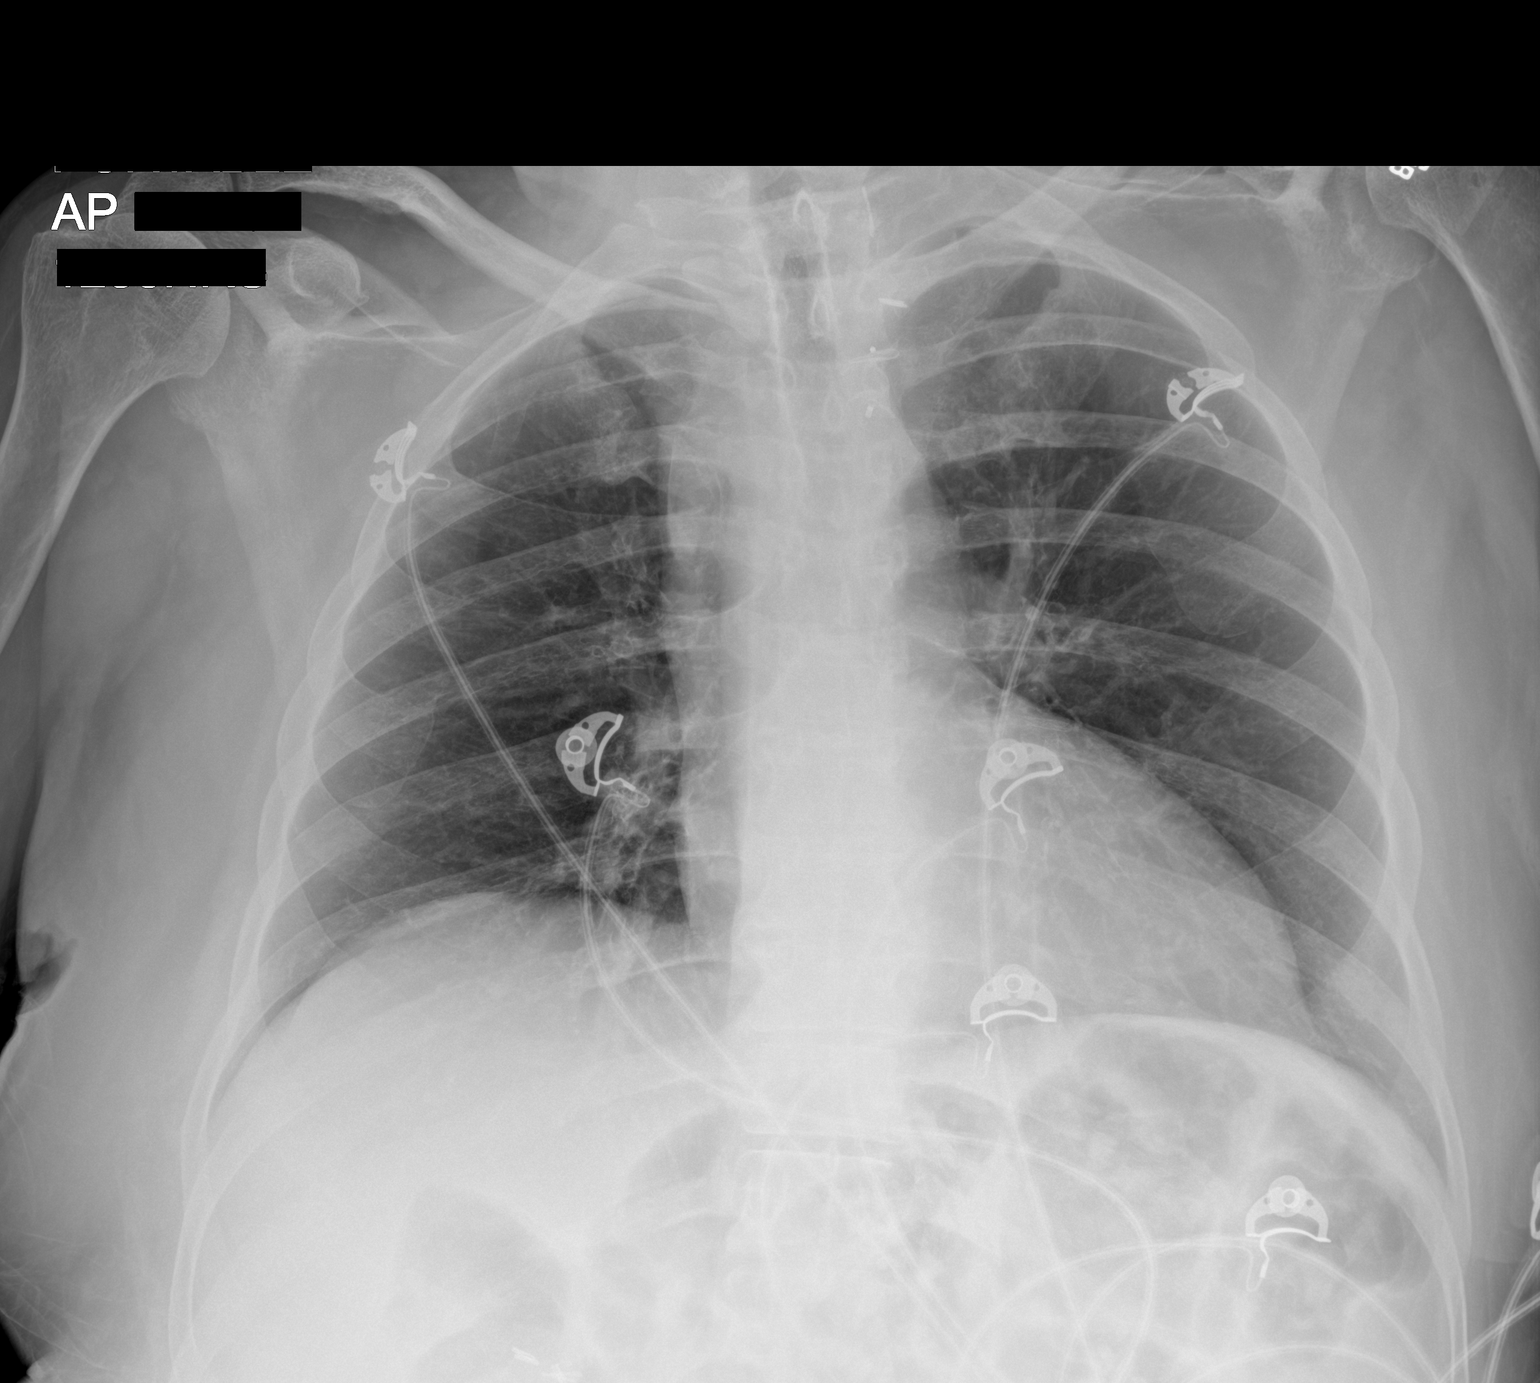

[1 of 1 positions shown; findings below may reference images not displayed]

FINDINGS: Portable AP upright view at 6677 hours. Lung volumes and mediastinal
contours are within normal limits. Right side aortic arch
demonstrated by CT last year. Visualized tracheal air column is
within normal limits. Allowing for portable technique the lungs are
clear. Left thoracic inlet surgical clips associated with chronic
great vessel bypass graph.

No acute osseous abnormality identified. Negative visible bowel gas
pattern.
IMPRESSION: No acute cardiopulmonary abnormality.

History of right side aortic arch and left subclavian bypass.

## 2021-10-18 IMAGING — CT CT HEAD W/O CM
3 series · 16 of 47 positions shown, 19 images · non-contrast
Comparison: CT head 02/18/2019

CLINICAL DATA: Chest pain.  Intermittent loss consciousness.

EXAM:
CT HEAD WITHOUT CONTRAST
TECHNIQUE: Contiguous axial images were obtained from the base of the skull
through the vertex without intravenous contrast.

[Series 2: head wo · axial · 0.41mm/px · z∈[-154,-24]mm · 10 of 32 slices shown, 13 images]
[im 3/32  brain]
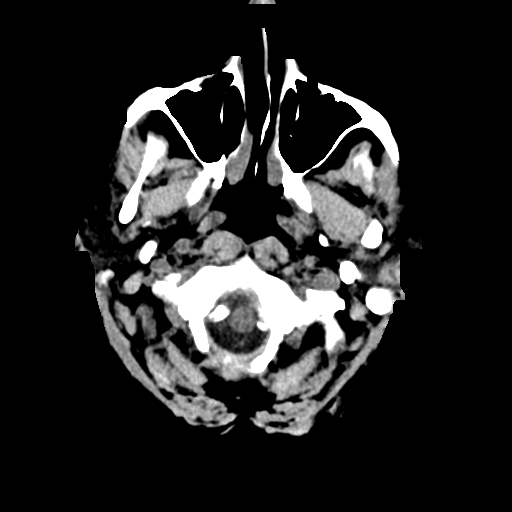
[im 3/32  bone]
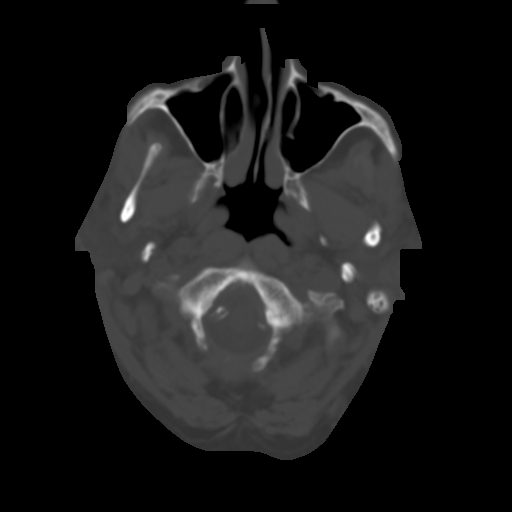
[im 6/32  brain]
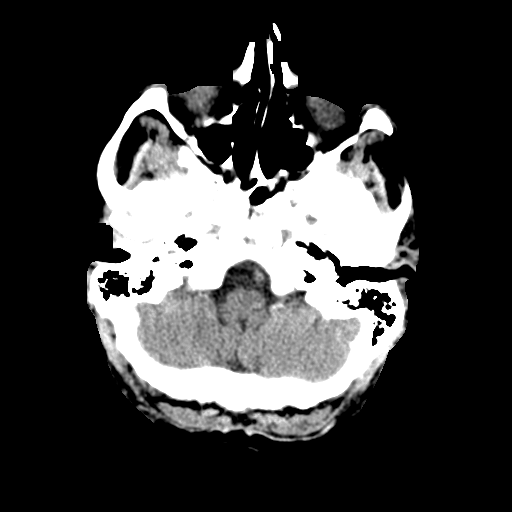
[im 9/32  brain]
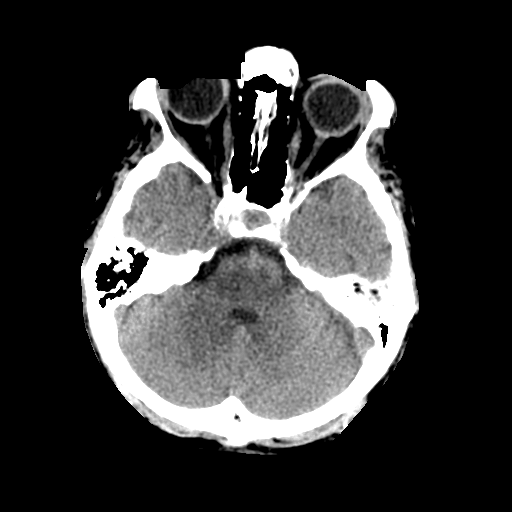
[im 11/32  brain]
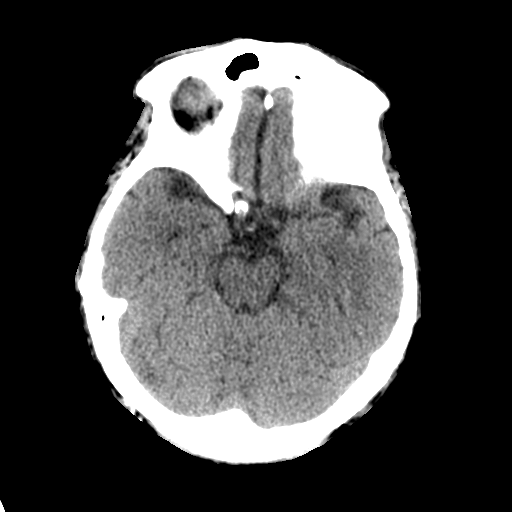
[im 14/32  brain]
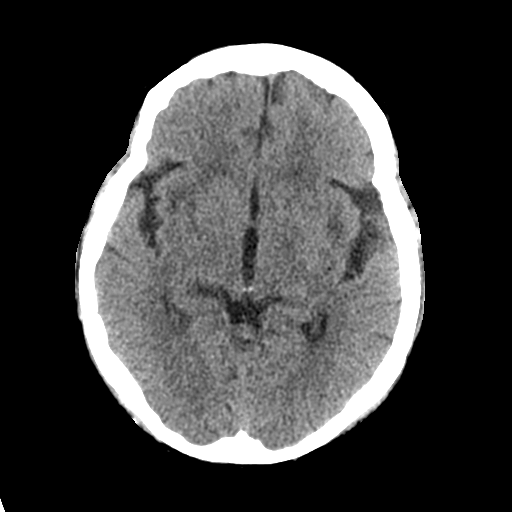
[im 14/32  bone]
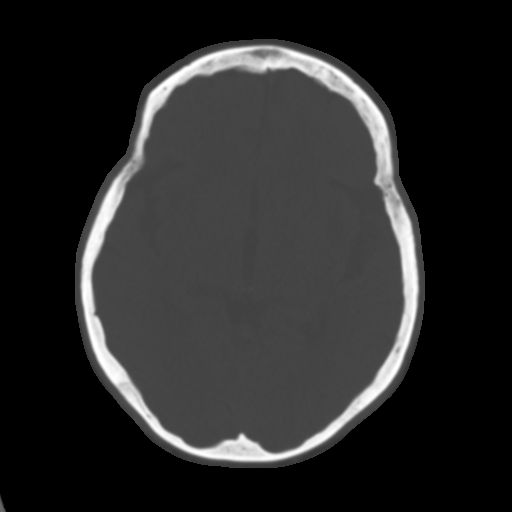
[im 18/32  brain]
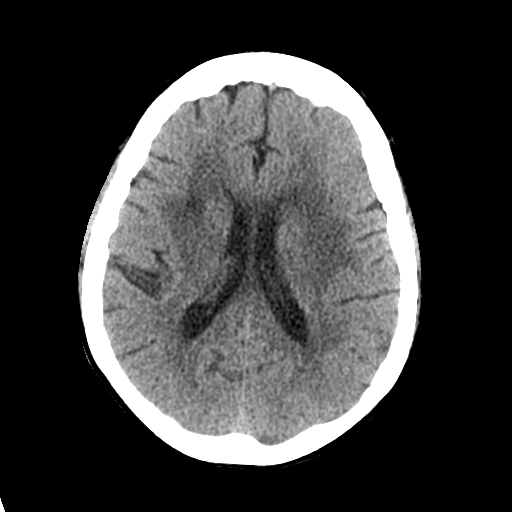
[im 21/32  brain]
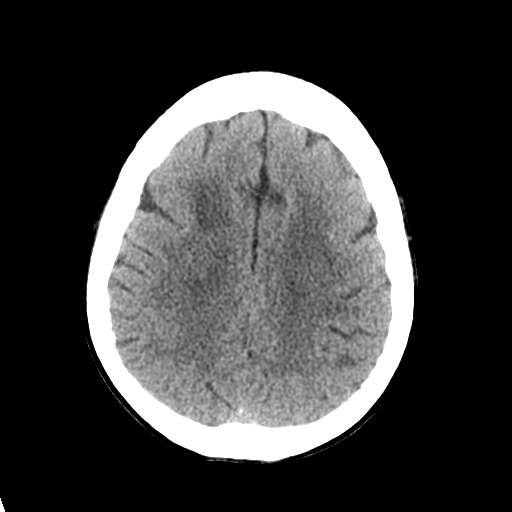
[im 24/32  brain]
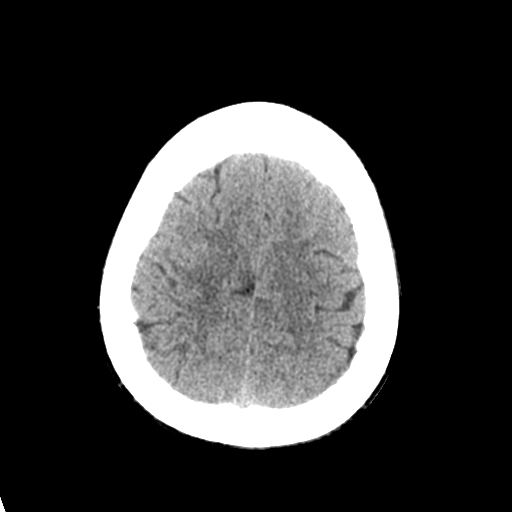
[im 26/32  brain]
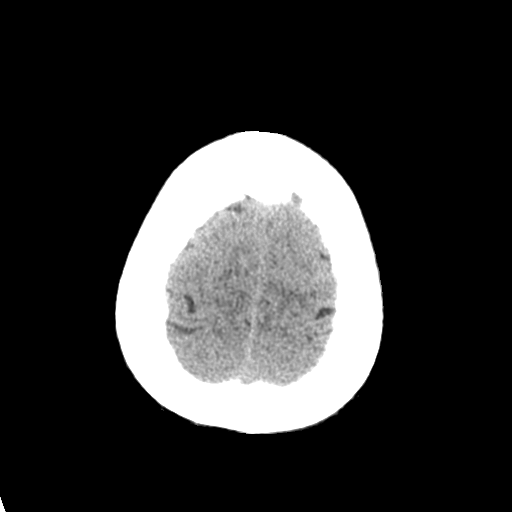
[im 26/32  bone]
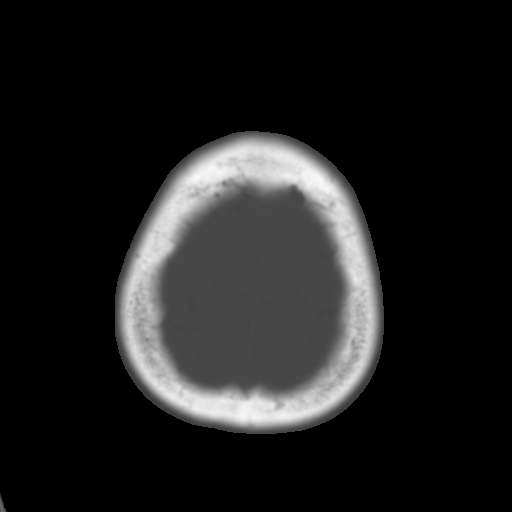
[im 29/32  brain]
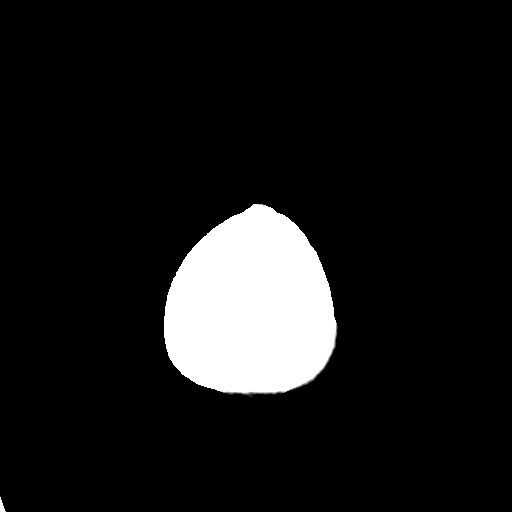

[Series 4: coronal soft tissue · coronal · 0.30mm/px · 3 of 67 slices shown]
[im 23/67  brain]
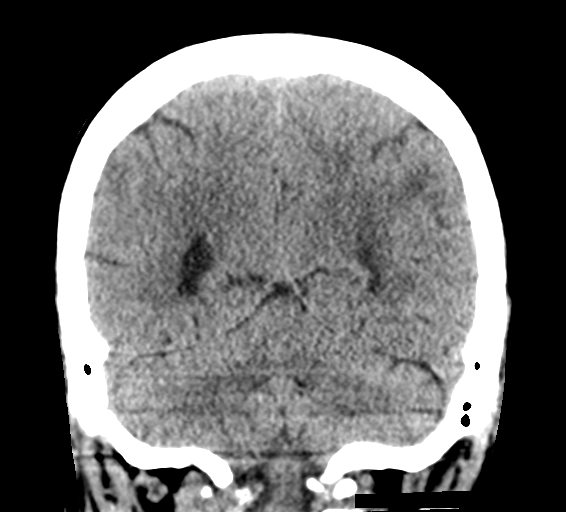
[im 30/67  brain]
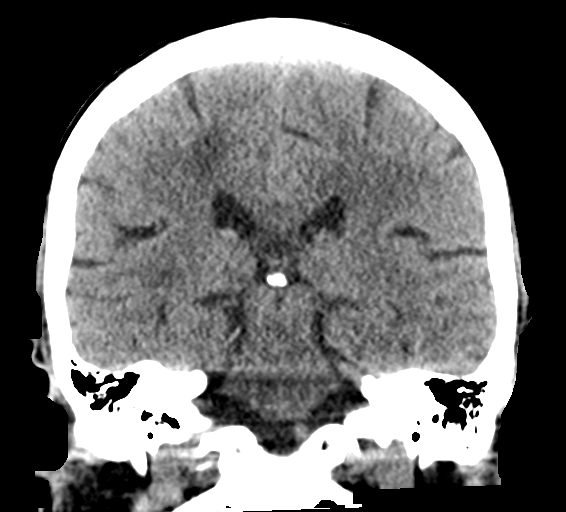
[im 37/67  brain]
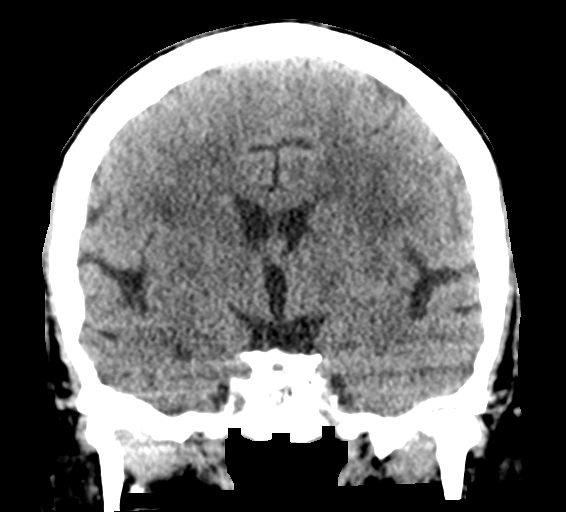

[Series 5: sagittal soft tissue · sagittal · 0.31mm/px · 3 of 56 slices shown]
[im 19/56  brain]
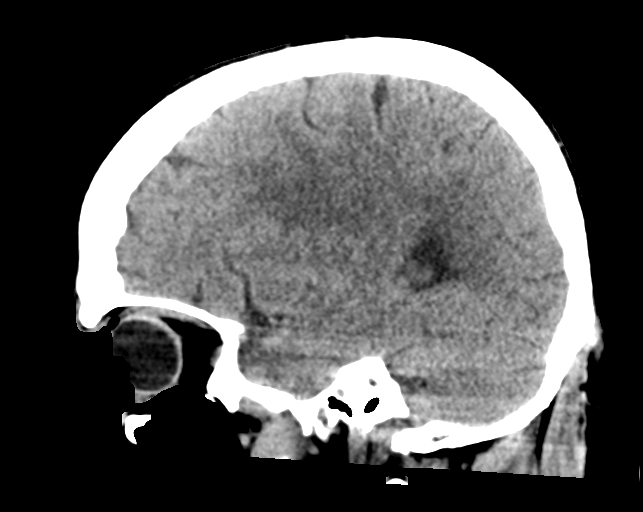
[im 28/56  brain]
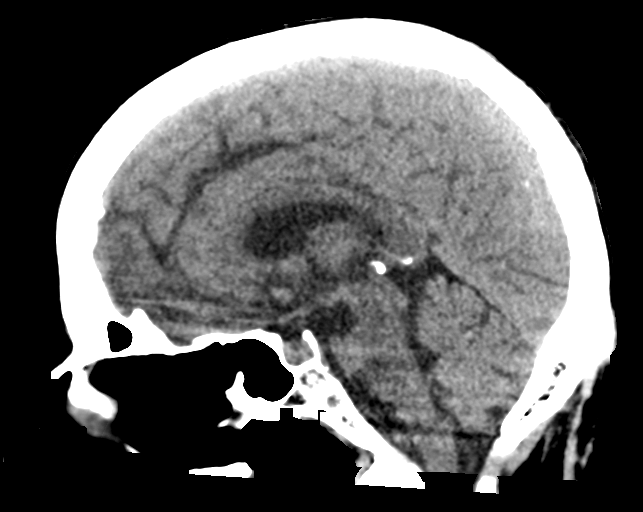
[im 37/56  brain]
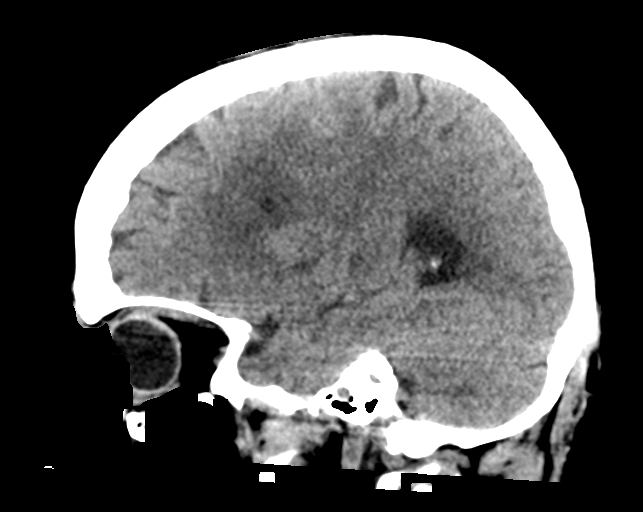

[16 of 47 positions shown; findings below may reference images not displayed]

FINDINGS: Brain: No evidence of acute infarction, hemorrhage, hydrocephalus,
extra-axial collection or mass lesion/mass effect. Old remote
lacunar infarcts in the basal ganglia as well as corona radiata
bilaterally. Confluent periventricular white matter hypodensity
consistent with chronic small vessel ischemic change.

Vascular: No hyperdense vessel or unexpected calcification.

Skull: Normal. Negative for fracture or focal lesion.

Sinuses/Orbits: No acute finding.

Other: None.
IMPRESSION: 1. No acute intracranial findings.
2. Chronic small vessel ischemic change and remote infarcts.

## 2021-10-18 IMAGING — US US EXTREM LOW VENOUS
1 series · 14 of 24 positions shown · non-contrast
Comparison: None.

CLINICAL DATA: Elevated D-dimer.

EXAM:
BILATERAL LOWER EXTREMITY VENOUS DOPPLER ULTRASOUND
TECHNIQUE: Gray-scale sonography with compression, as well as color and duplex
ultrasound, were performed to evaluate the deep venous system(s)
from the level of the common femoral vein through the popliteal and
proximal calf veins.

[Series 1: us venous img lower bilat (dvt) · portal-venous · 14 of 59 slices shown]
[im 1/59]
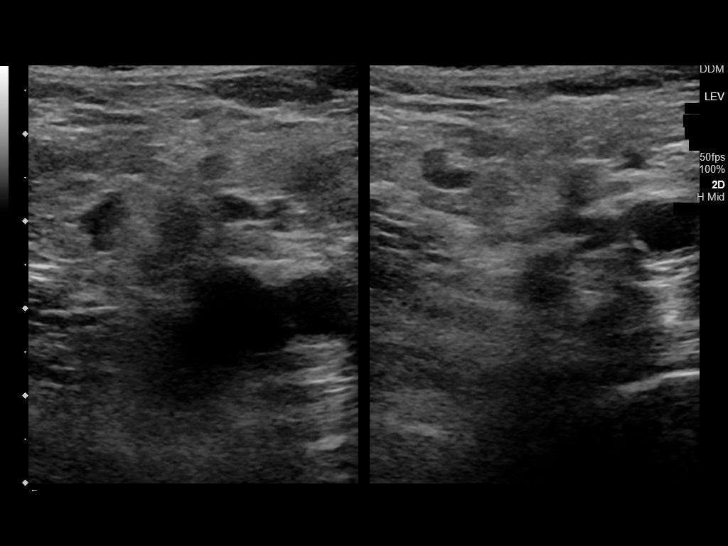
[im 6/59]
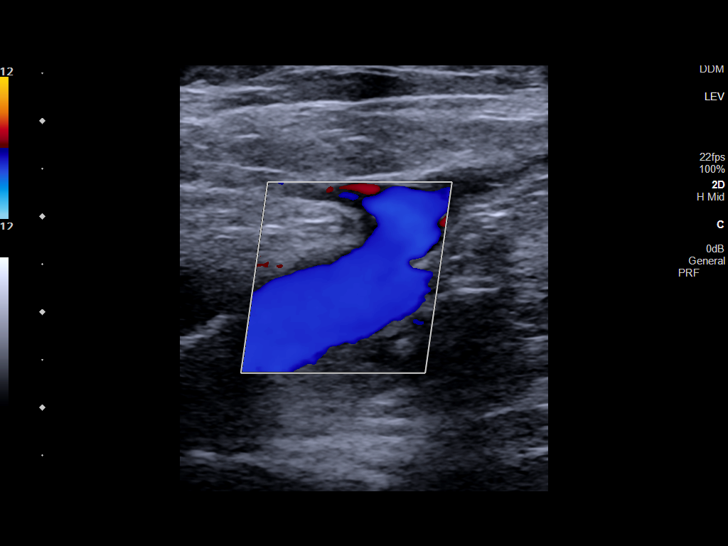
[im 11/59]
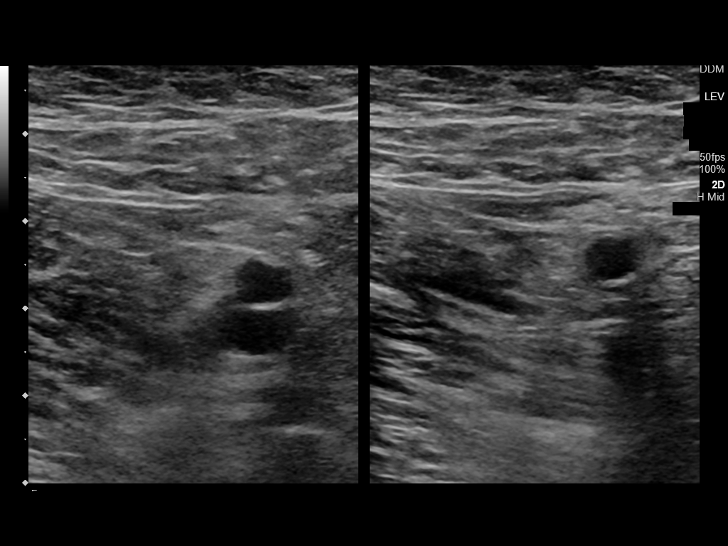
[im 16/59]
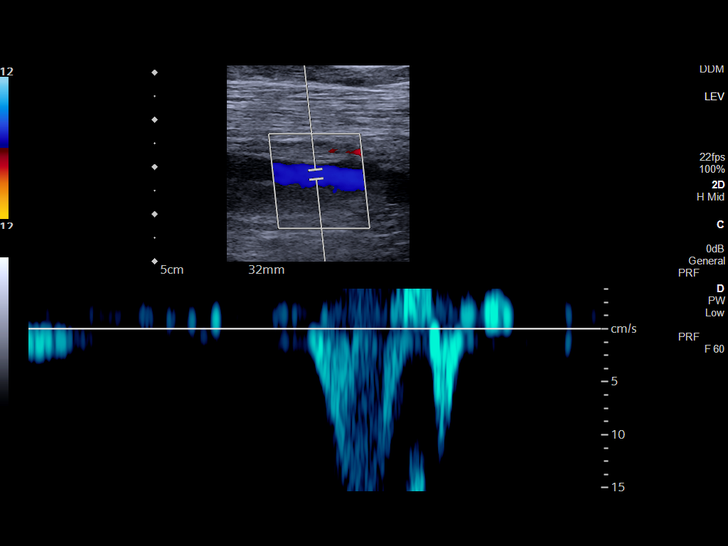
[im 18/59]
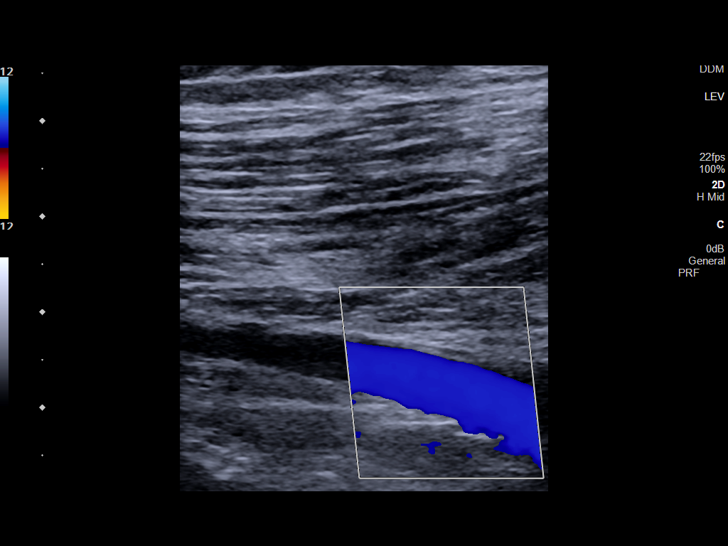
[im 23/59]
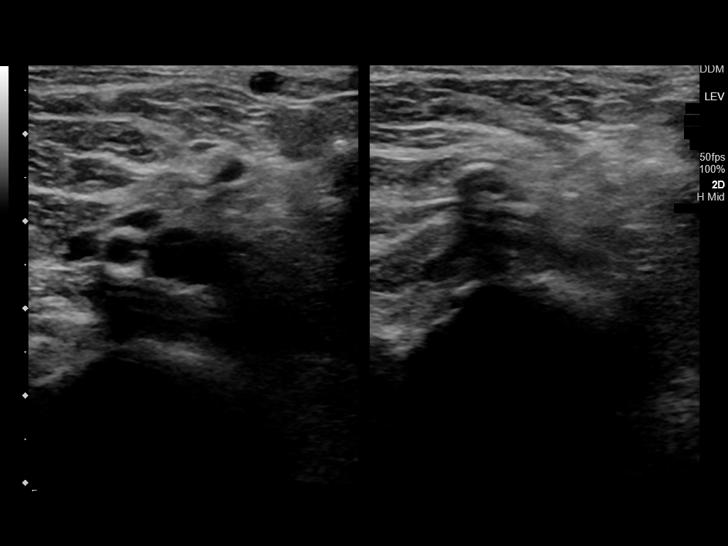
[im 28/59]
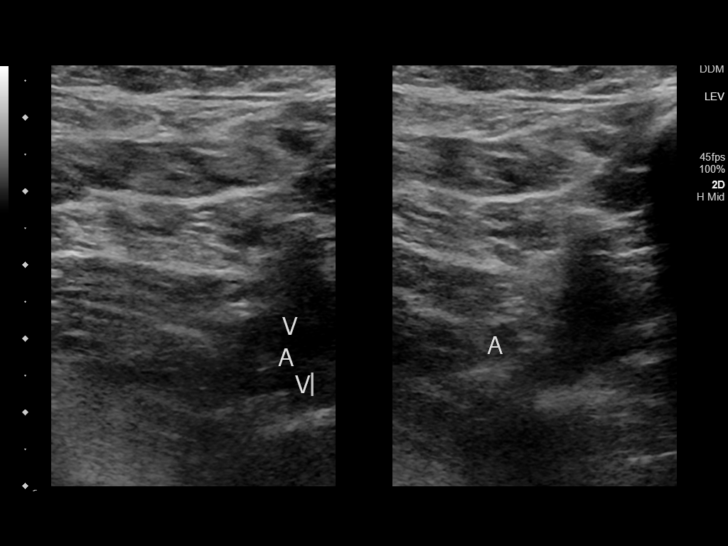
[im 31/59]
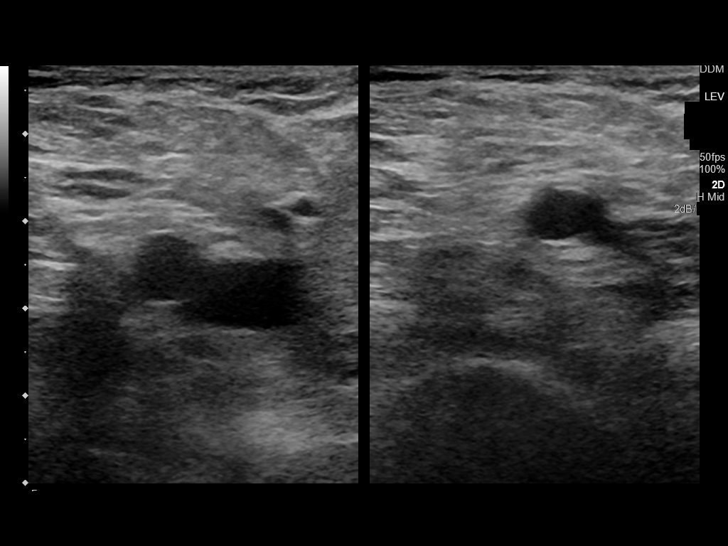
[im 36/59]
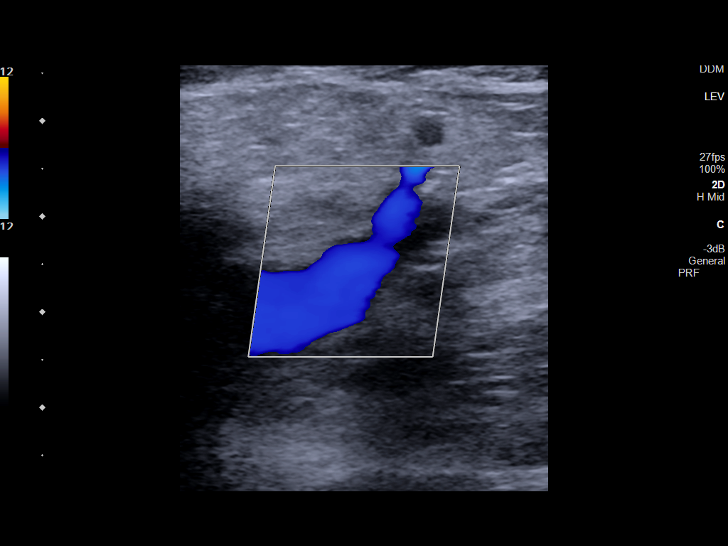
[im 41/59]
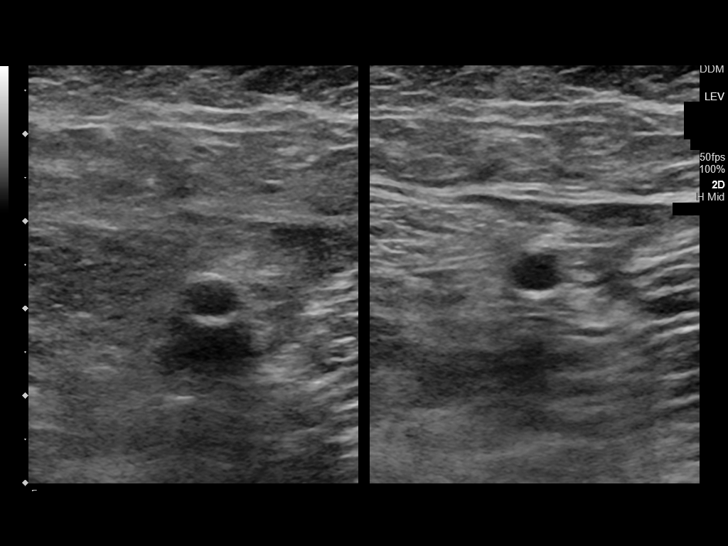
[im 46/59]
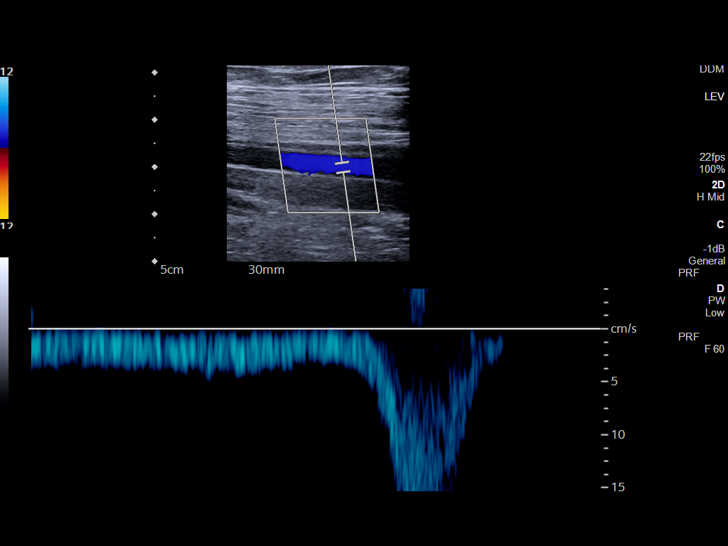
[im 48/59]
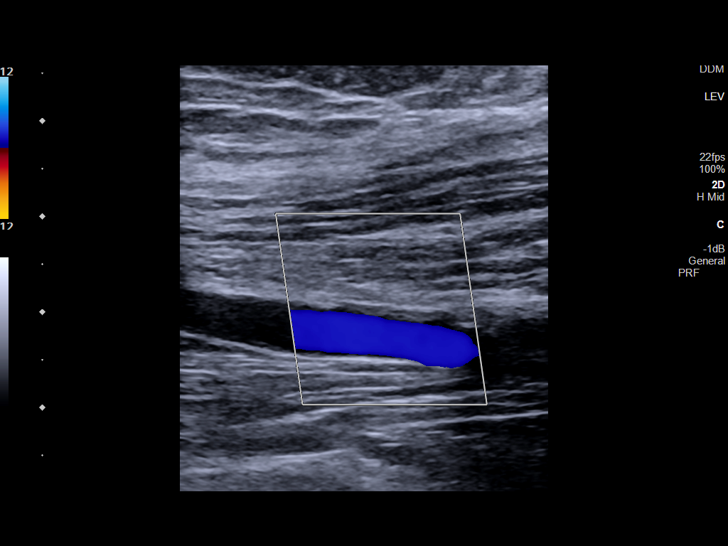
[im 53/59]
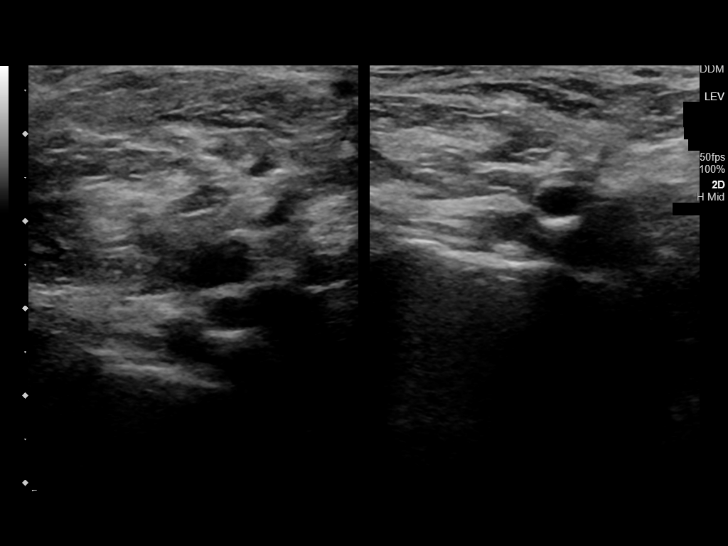
[im 59/59]
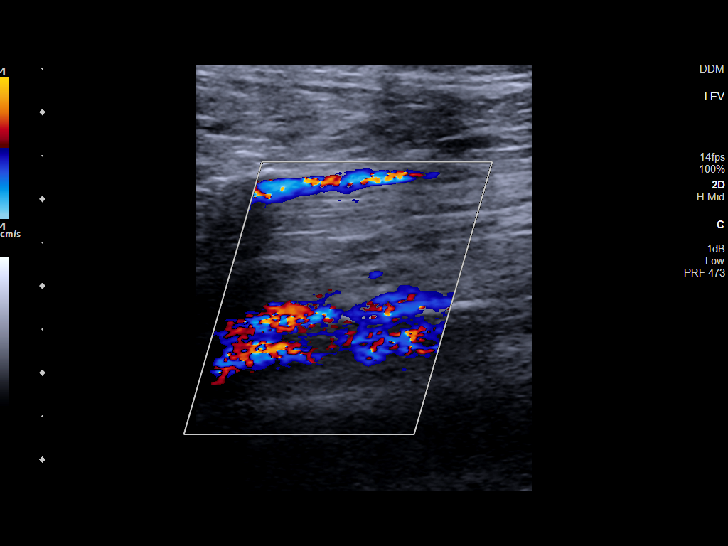

[14 of 24 positions shown; findings below may reference images not displayed]

FINDINGS: VENOUS

Normal compressibility of the common femoral, superficial femoral,
and popliteal veins, as well as the visualized calf veins.
Visualized portions of profunda femoral vein and great saphenous
vein unremarkable. No filling defects to suggest DVT on grayscale or
color Doppler imaging. Doppler waveforms show normal direction of
venous flow, normal respiratory plasticity and response to
augmentation.

Limited views of the contralateral common femoral vein are
unremarkable.

OTHER

None.

Limitations: none
IMPRESSION: Negative.

## 2021-10-18 IMAGING — MR MR HEAD W/O CM
11 series · 41 of 48 positions shown · non-contrast
Comparison: 02/19/2019

CLINICAL DATA: Syncope

EXAM:
MRI HEAD WITHOUT CONTRAST
TECHNIQUE: Multiplanar, multiecho pulse sequences of the brain and surrounding
structures were obtained without intravenous contrast.

[Series 5: ax dwi_tracew · axial · 3.0mm · 0.60mm/px · z∈[-17,+129]mm · 4 of 46 slices shown]
[im 1/46]
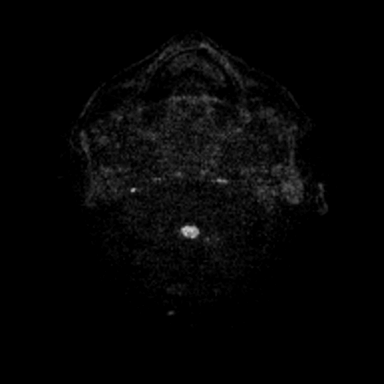
[im 16/46]
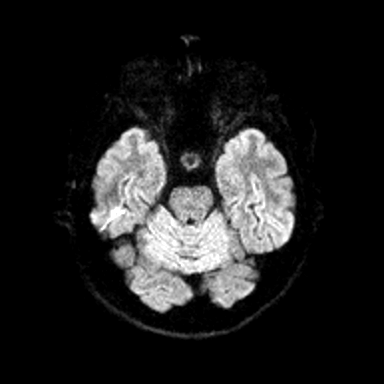
[im 31/46]
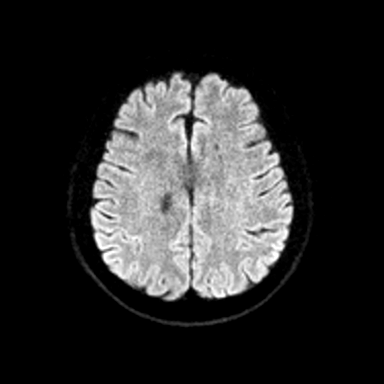
[im 46/46]
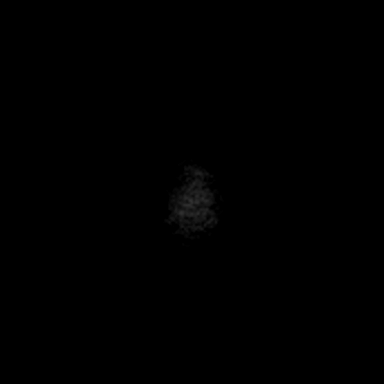

[Series 6: ax dwi_adc · axial · 3.0mm · 0.60mm/px · z∈[-17,+126]mm · 4 of 45 slices shown]
[im 1/45]
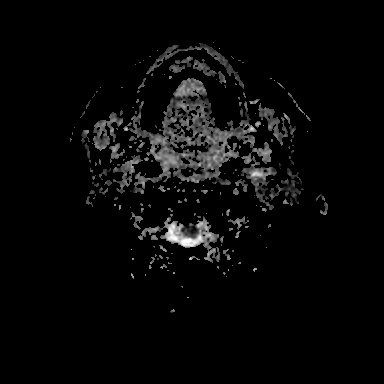
[im 15/45]
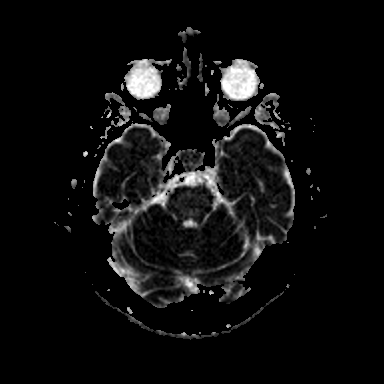
[im 30/45]
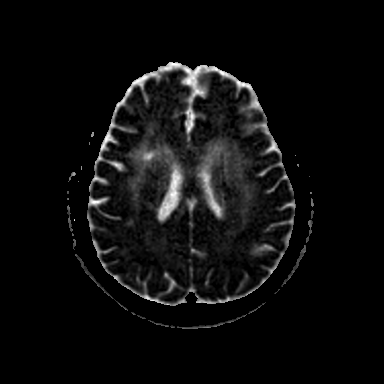
[im 45/45]
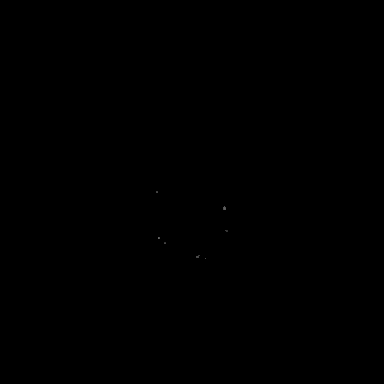

[Series 7: cor dwi_tracew · coronal · 5.0mm · 0.60mm/px · 3 of 34 slices shown]
[im 1/34]
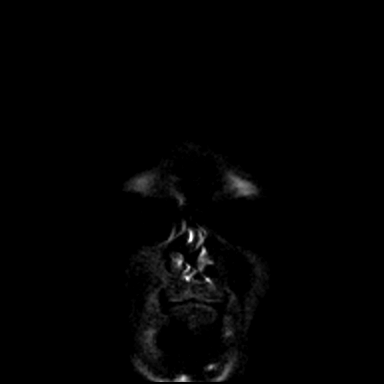
[im 17/34]
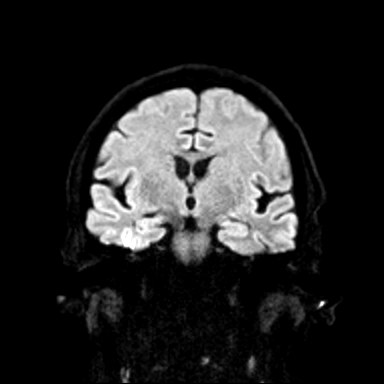
[im 34/34]
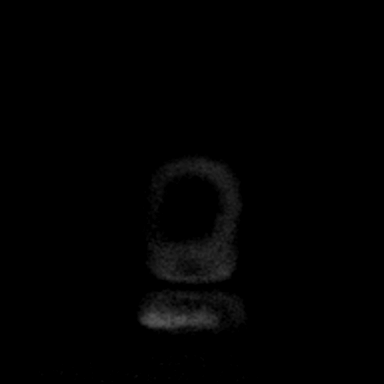

[Series 8: cor dwi_adc · coronal · 5.0mm · 0.60mm/px · 3 of 33 slices shown]
[im 1/33]
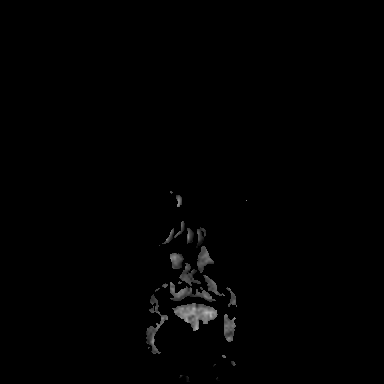
[im 17/33]
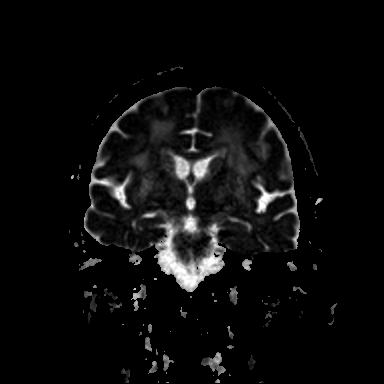
[im 33/33]
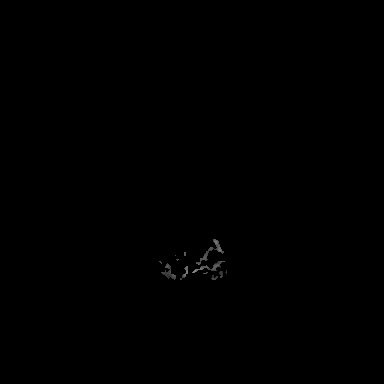

[Series 9: T1 · sagittal · 5.0mm · 0.62mm/px · 2 of 21 slices shown (1 of 2)]
[im 1/21]
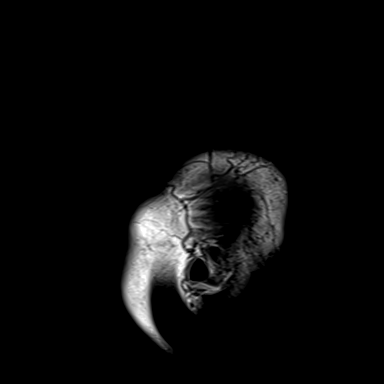
[im 21/21]
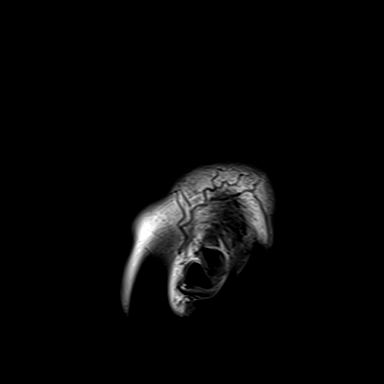

[Series 10: T2 · axial · 5.0mm · 0.53mm/px · z∈[-12,+130]mm · 2 of 25 slices shown (1 of 2)]
[im 1/25]
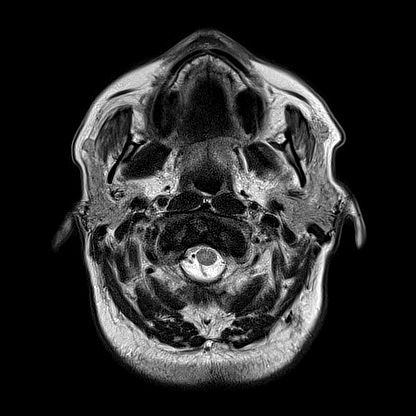
[im 25/25]
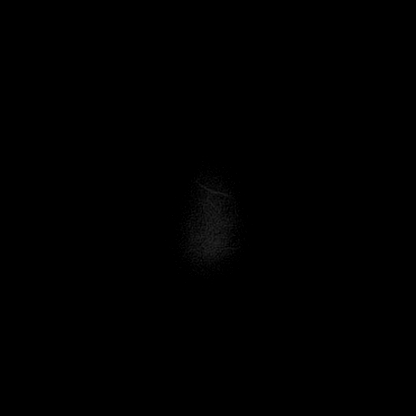

[Series 12: pha_images · axial · 3.0mm · 0.90mm/px · z∈[-27,+144]mm · 5 of 58 slices shown]
[im 1/58]
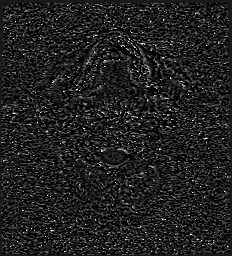
[im 15/58]
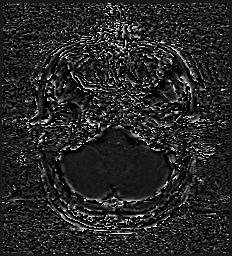
[im 29/58]
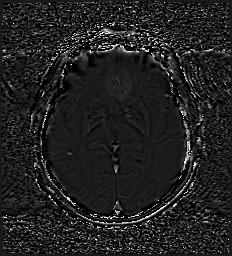
[im 43/58]
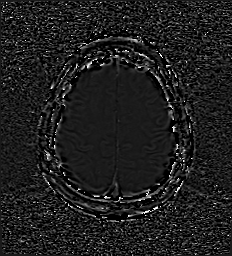
[im 58/58]
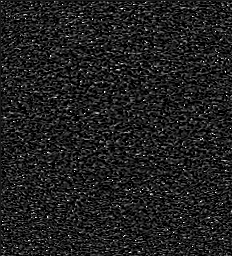

[Series 13: swi_images · axial · 3.0mm · 0.90mm/px · z∈[-27,+58]mm · 3 of 60 slices shown]
[im 1/60]
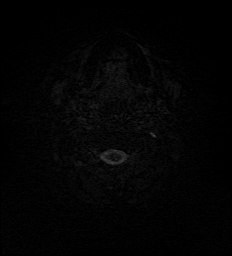
[im 15/60]
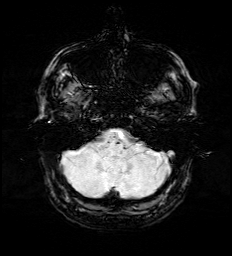
[im 30/60]
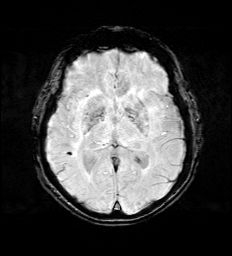

[Series 15: FLAIR · axial · 3.0mm · 0.53mm/px · z∈[-21,+139]mm · 5 of 55 slices shown]
[im 1/55]
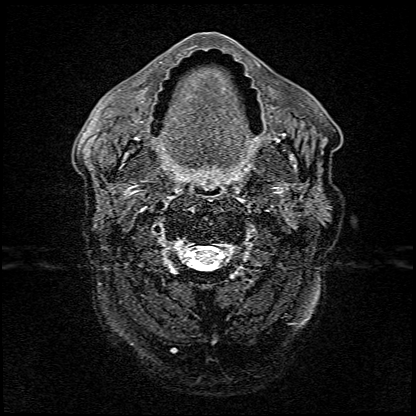
[im 14/55]
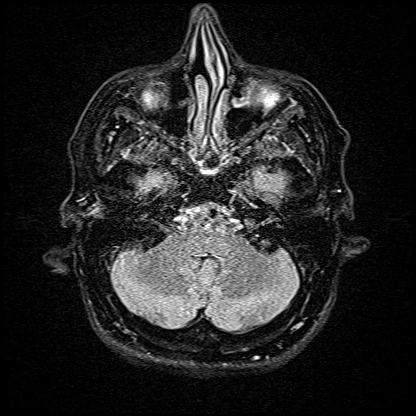
[im 28/55]
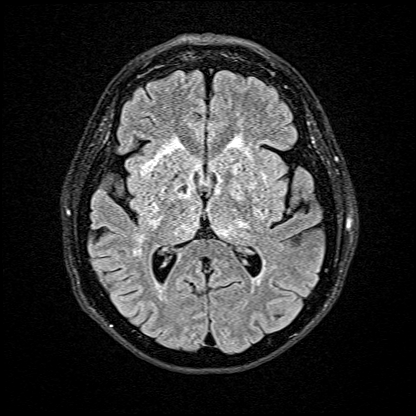
[im 41/55]
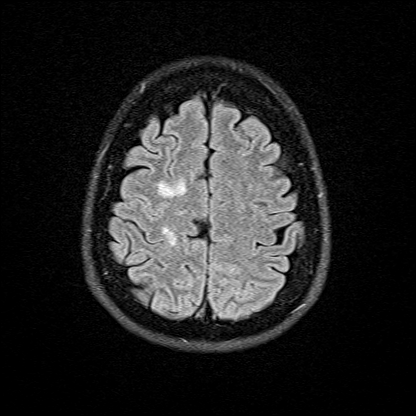
[im 55/55]
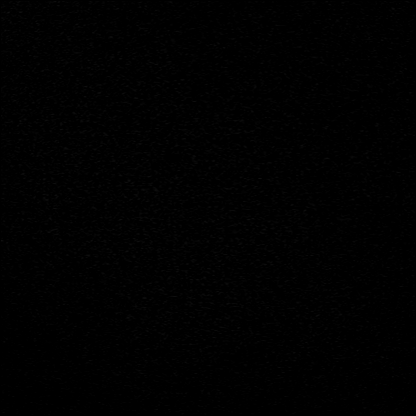

[Series 16: T1 · axial · 1.0mm · 0.98mm/px · z∈[-8,+133]mm · 8 of 144 slices shown (2 of 2)]
[im 1/144]
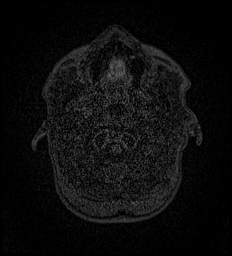
[im 24/144]
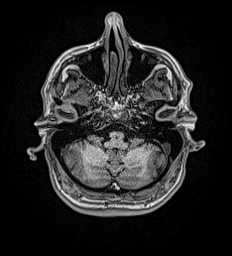
[im 48/144]
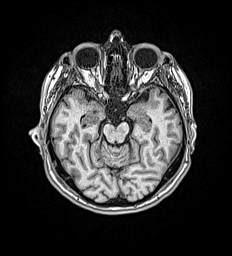
[im 60/144]
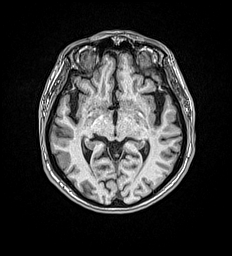
[im 84/144]
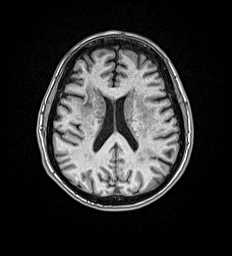
[im 96/144]
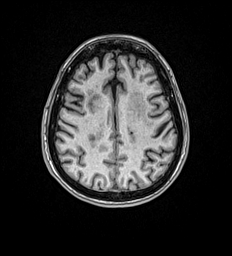
[im 120/144]
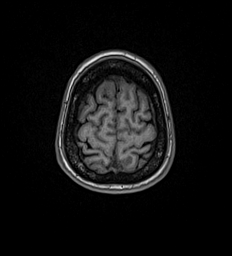
[im 144/144]
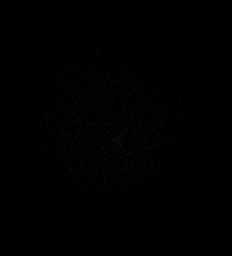

[Series 17: T2 · coronal · 5.0mm · 0.45mm/px · 2 of 27 slices shown (2 of 2)]
[im 1/27]
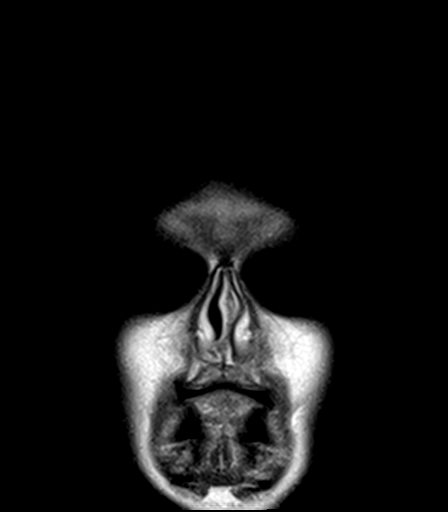
[im 27/27]
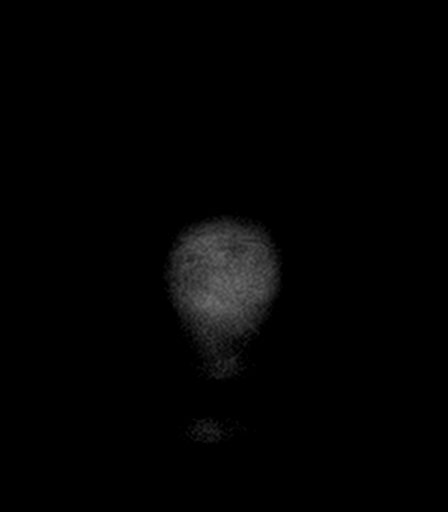

[41 of 48 positions shown; findings below may reference images not displayed]

FINDINGS: Brain: No acute infarct, acute hemorrhage or extra-axial collection.
Diffuse confluent hyperintense T2-weighted signal within the
periventricular, deep and juxtacortical white matter. Multiple
remote small vessel infarcts. Normal volume of CSF spaces. Fewer
than 5 scattered microhemorrhages in a nonspecific pattern. Normal
midline structures.

Vascular: Normal flow voids.

Skull and upper cervical spine: Normal marrow signal.

Sinuses/Orbits: Negative.

Other: None.
IMPRESSION: 1. No acute intracranial abnormality.
2. Diffuse confluent hyperintense T2-weighted signal within the
periventricular, deep and juxtacortical white matter, consistent
with chronic ischemic microangiopathy.

## 2021-10-19 IMAGING — NM NM PULMONARY PERF PARTICULATE
1 series · 8 of 8 positions shown · non-contrast
Comparison: Chest radiograph 11/05/2019

CLINICAL DATA: PE suspected.  Elevated D-dimer.  Chest pain.

EXAM:
NUCLEAR MEDICINE PERFUSION LUNG SCAN
TECHNIQUE: Perfusion images were obtained in multiple projections after
intravenous injection of radiopharmaceutical.
RADIOPHARMACEUTICALS:  3.4 mCi Cc-55m MAA

[Series 1000: lung perfusion · 1.95mm/px · 4 acquisitions, 8 frames shown]
[im 1/4]
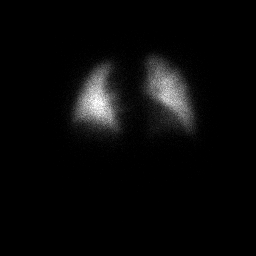
[im 1/4]
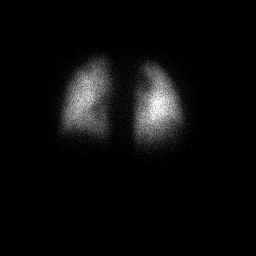
[im 2/4]
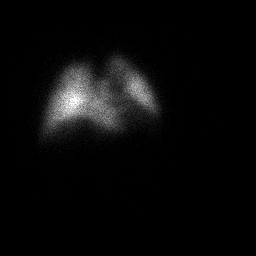
[im 2/4]
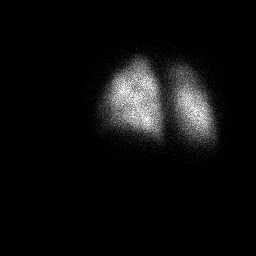
[im 3/4]
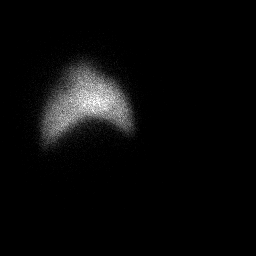
[im 3/4]
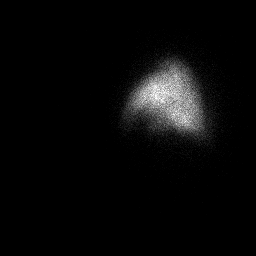
[im 4/4]
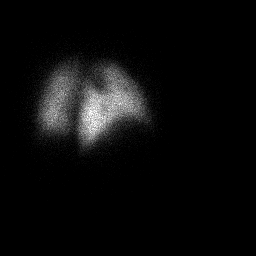
[im 4/4]
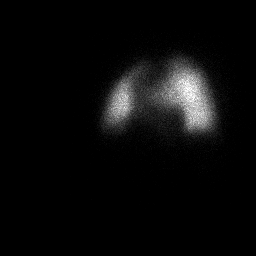

[8 of 8 positions shown; findings below may reference images not displayed]

FINDINGS: Wedge-shaped peripheral filling defect within superior segment of
the RIGHT upper lobe seen on the RPO projection. RIGHT upper lobe
defect also seen on the posterior projection. No corresponding
finding on chest radiograph.
IMPRESSION: Findings most consistent with RIGHT upper lobe pulmonary embolism.

## 2021-10-31 IMAGING — CT CT HEAD W/O CM
3 series · 16 of 47 positions shown, 19 images · non-contrast
Comparison: 11/05/2019

CLINICAL DATA: Altered mental status

EXAM:
CT HEAD WITHOUT CONTRAST
TECHNIQUE: Contiguous axial images were obtained from the base of the skull
through the vertex without intravenous contrast.

[Series 2: head wo · axial · 0.40mm/px · z∈[-116,+9]mm · 10 of 31 slices shown, 13 images]
[im 3/31  brain]
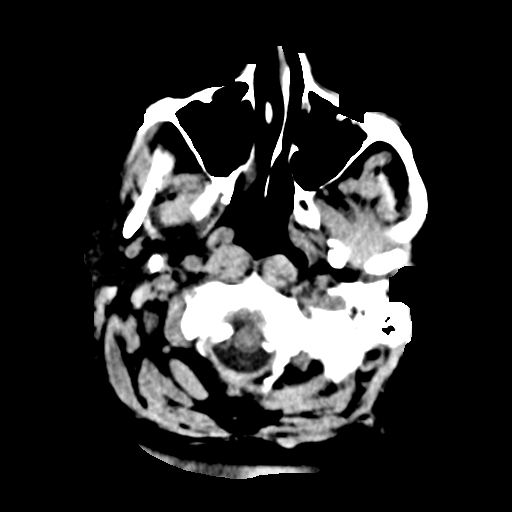
[im 3/31  bone]
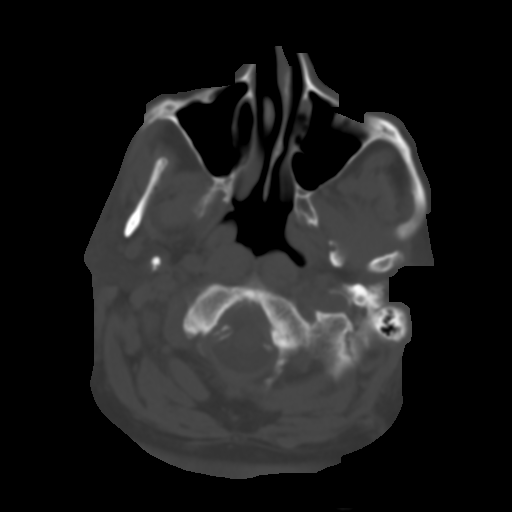
[im 6/31  brain]
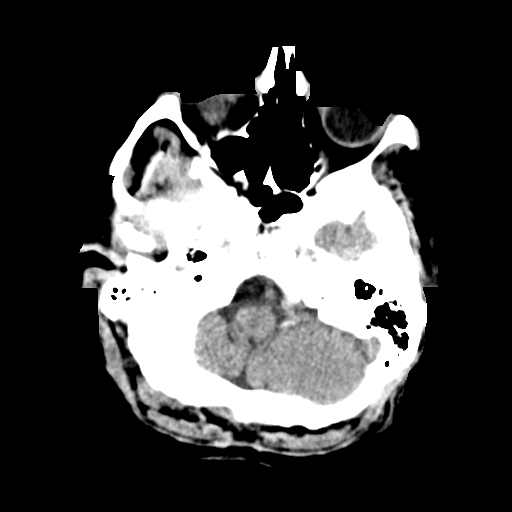
[im 9/31  brain]
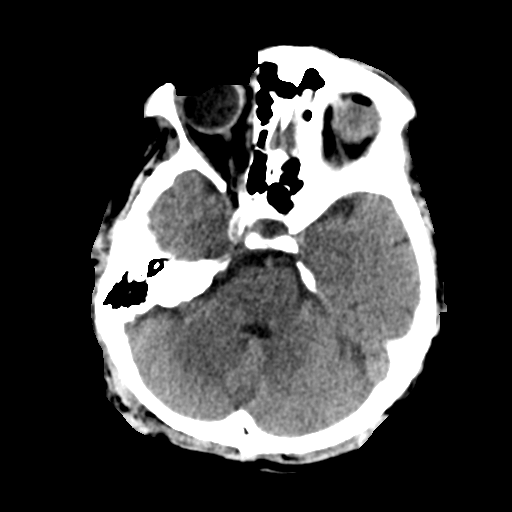
[im 11/31  brain]
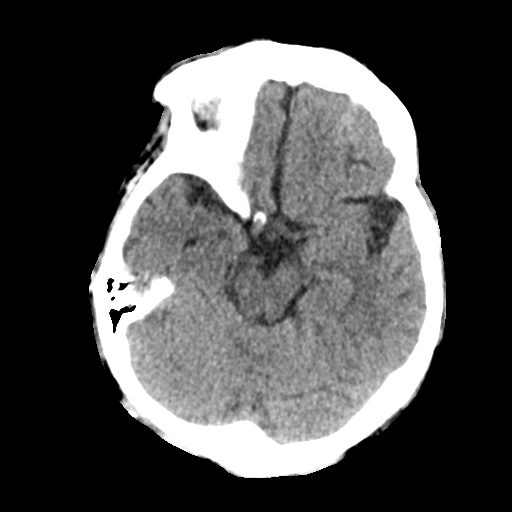
[im 14/31  brain]
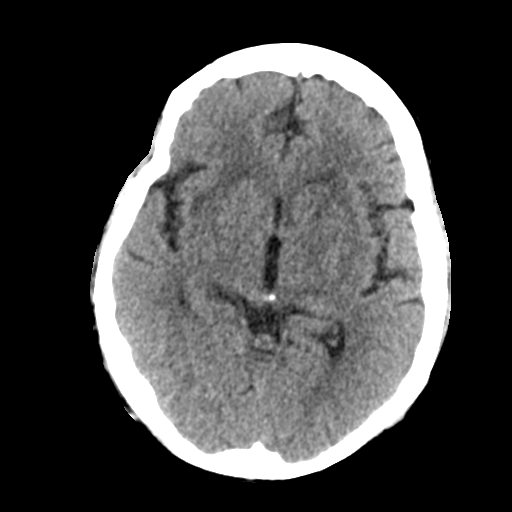
[im 14/31  bone]
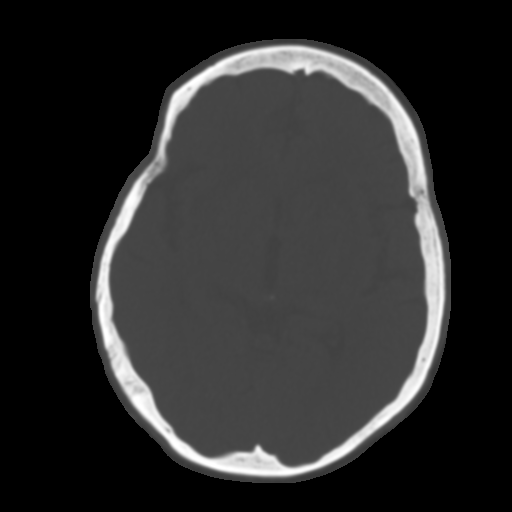
[im 17/31  brain]
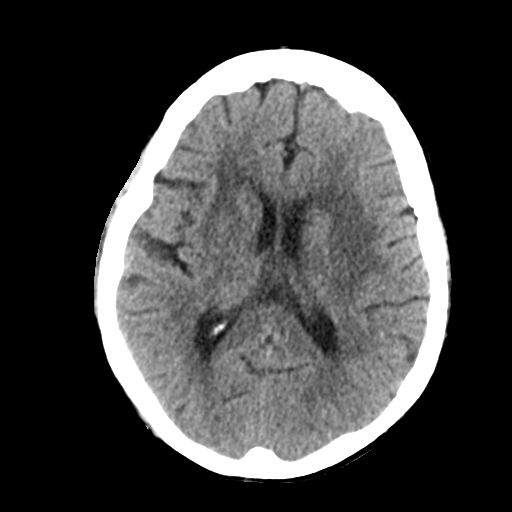
[im 20/31  brain]
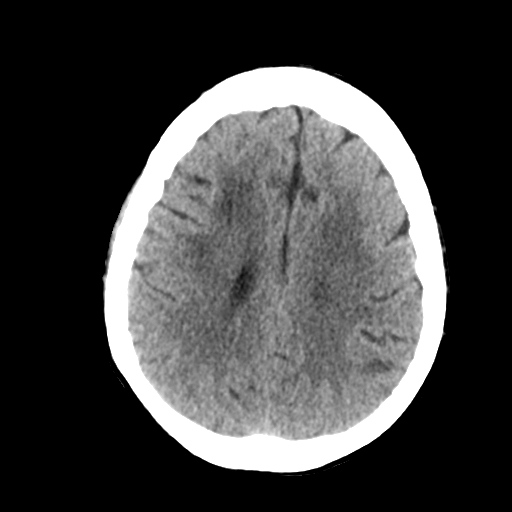
[im 23/31  brain]
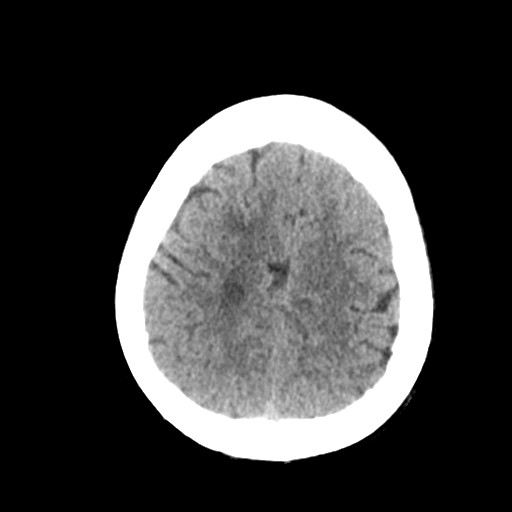
[im 25/31  brain]
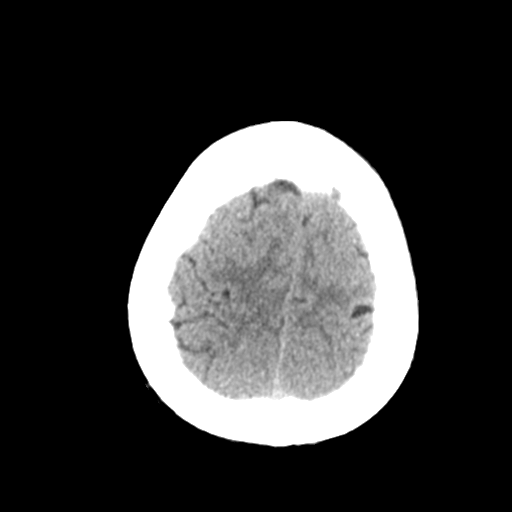
[im 25/31  bone]
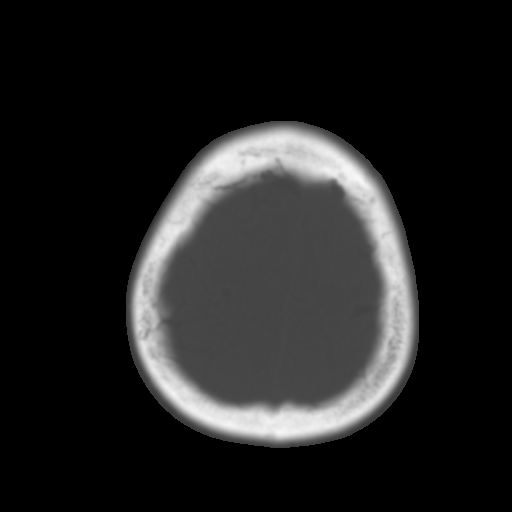
[im 28/31  brain]
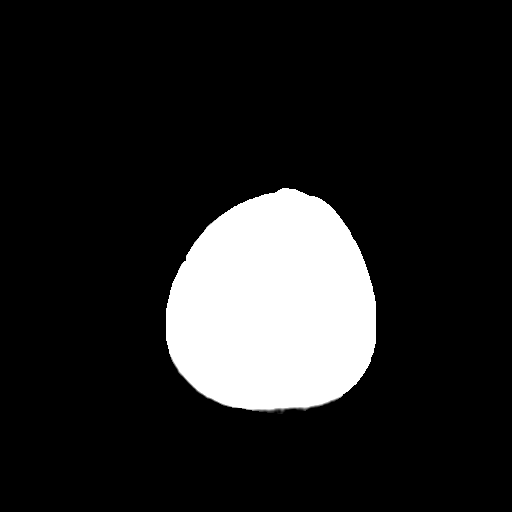

[Series 4: coronal soft tissue · coronal · 0.32mm/px · 3 of 66 slices shown]
[im 22/66  brain]
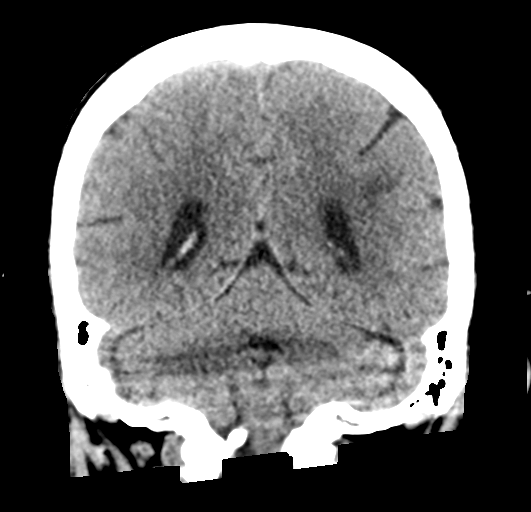
[im 29/66  brain]
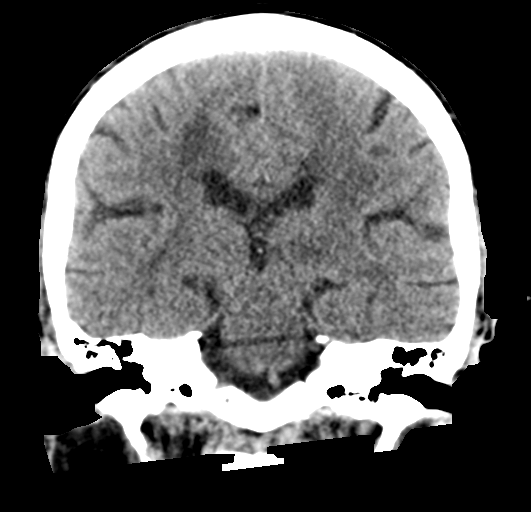
[im 37/66  brain]
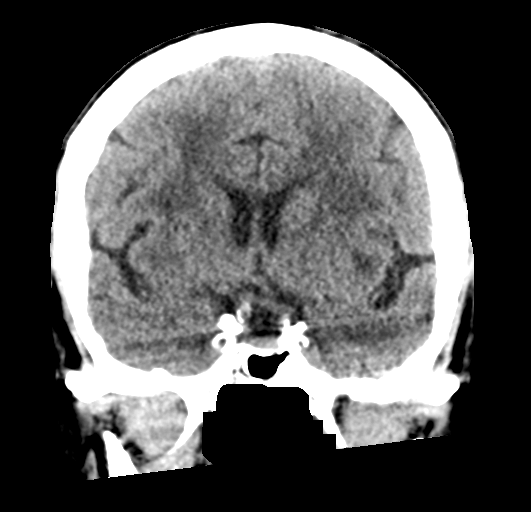

[Series 5: sagittal soft tissue · sagittal · 0.32mm/px · 3 of 55 slices shown]
[im 20/55  brain]
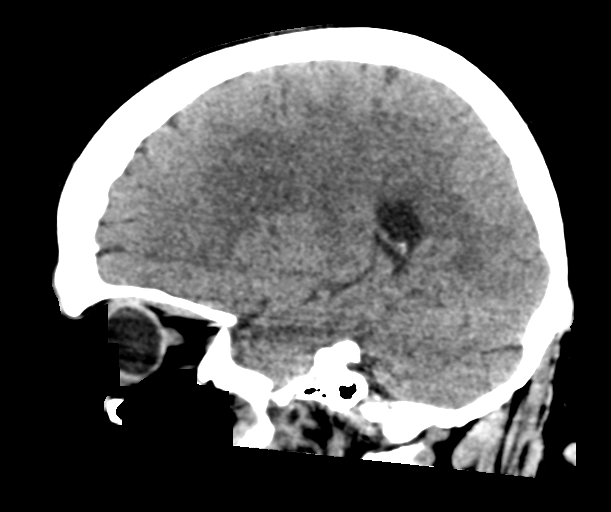
[im 28/55  brain]
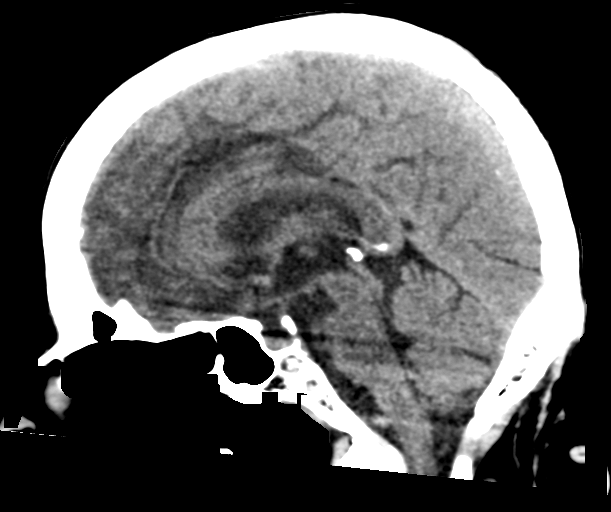
[im 36/55  brain]
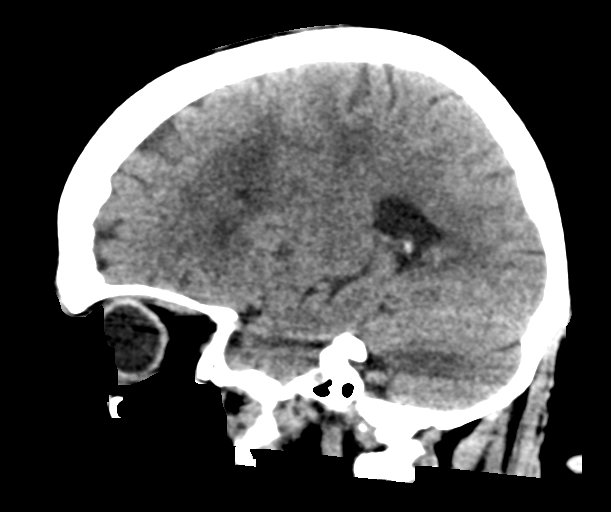

[16 of 47 positions shown; findings below may reference images not displayed]

FINDINGS: Brain: No evidence of acute infarction, hemorrhage, hydrocephalus,
extra-axial collection or mass lesion/mass effect. Extensive
periventricular and deep white matter hypodensity including lacunar
infarctions of the right basal ganglia.

Vascular: No hyperdense vessel or unexpected calcification.

Skull: Normal. Negative for fracture or focal lesion.

Sinuses/Orbits: No acute finding.

Other: None.
IMPRESSION: No acute intracranial pathology. Advanced small-vessel white matter
disease.

## 2021-10-31 IMAGING — CT CT ANGIO CHEST
2 of 6 series · 17 of 46 positions shown · IV contrast (APPLIED)
Comparison: Nuclear medicine perfusion study dated 11/06/2019.
Current chest radiograph. Prior chest CT, 01/19/2019.

CLINICAL DATA: Neck pain radiating down the left shoulder. Recently
discharged with diagnosis of a blood clot to her lungs.

EXAM:
CT ANGIOGRAPHY CHEST WITH CONTRAST
TECHNIQUE: Multidetector CT imaging of the chest was performed using the
standard protocol during bolus administration of intravenous
contrast. Multiplanar CT image reconstructions and MIPs were
obtained to evaluate the vascular anatomy.
CONTRAST:  60mL OMNIPAQUE IOHEXOL 350 MG/ML SOLN

[Series 5: thins · axial · 0.87mm/px · z∈[-83,+186]mm · 14 of 295 slices shown]
[im 13/295  lung]
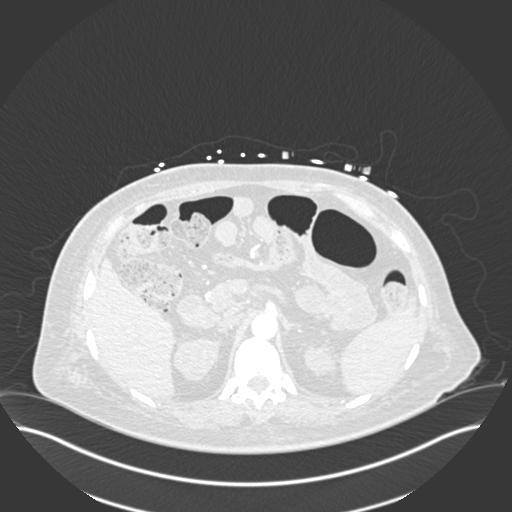
[im 39/295  soft-tissue]
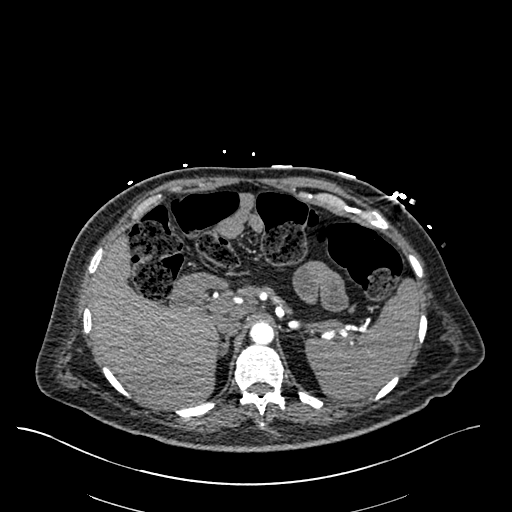
[im 52/295  lung]
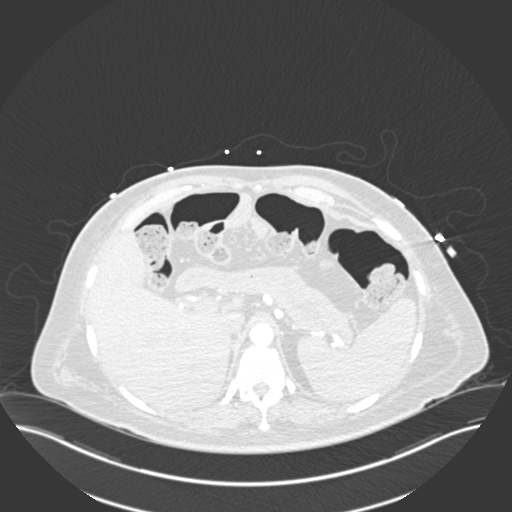
[im 77/295  soft-tissue]
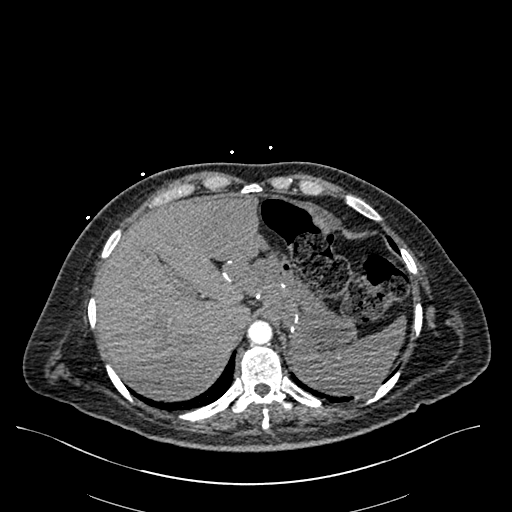
[im 103/295  lung]
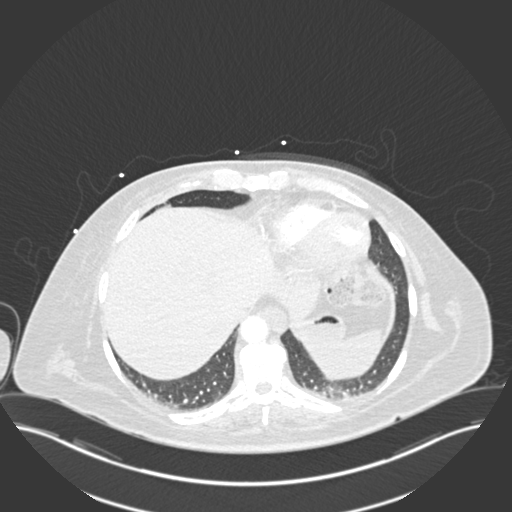
[im 116/295  soft-tissue]
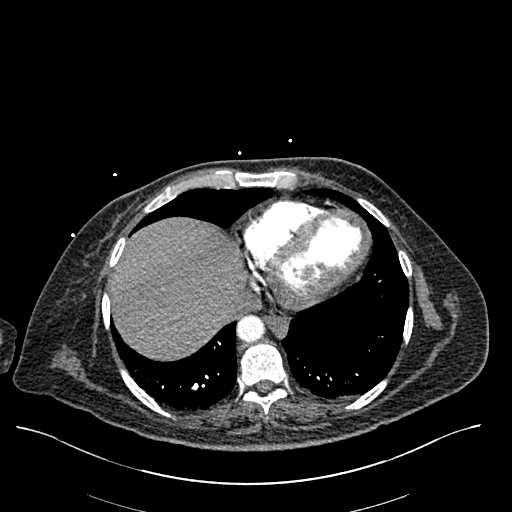
[im 141/295  lung]
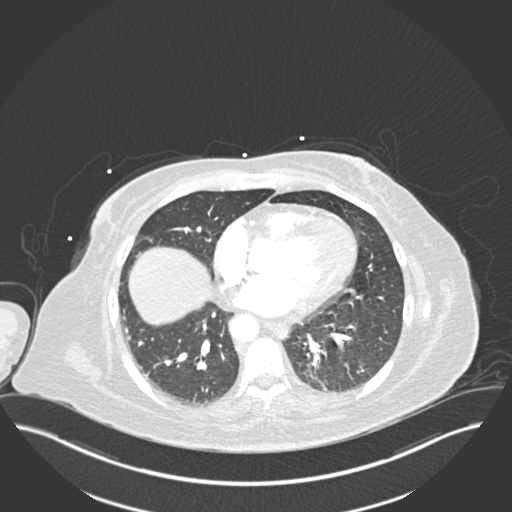
[im 154/295  soft-tissue]
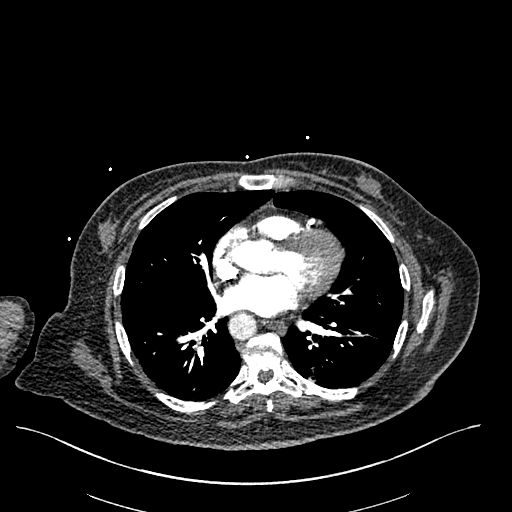
[im 179/295  lung]
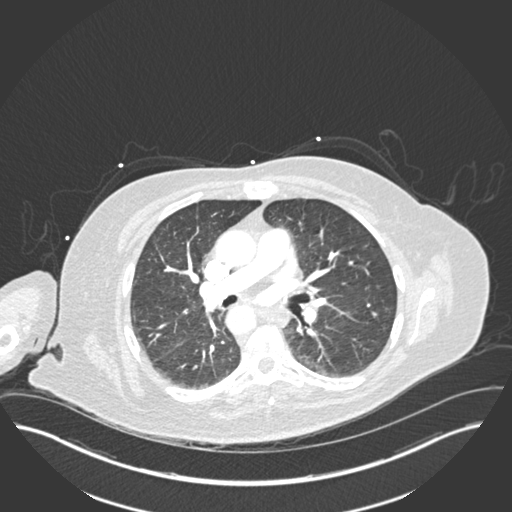
[im 192/295  soft-tissue]
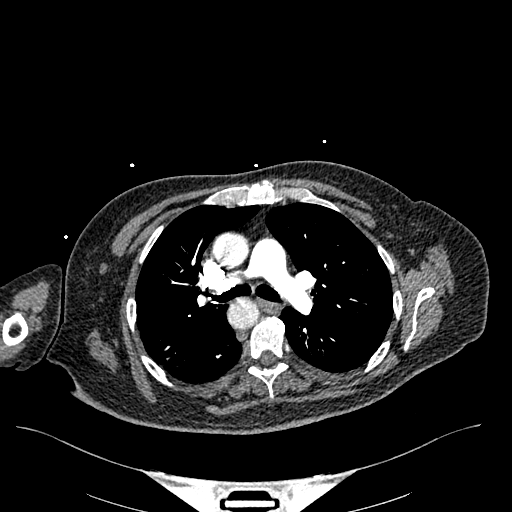
[im 218/295  lung]
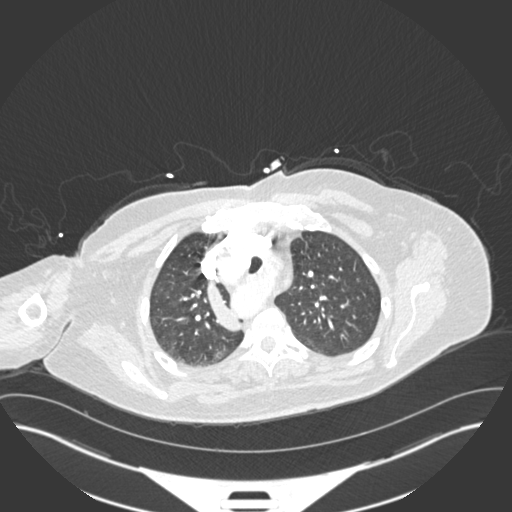
[im 243/295  soft-tissue]
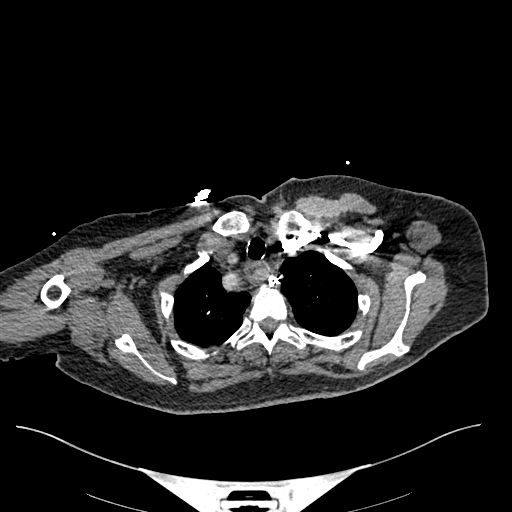
[im 256/295  lung]
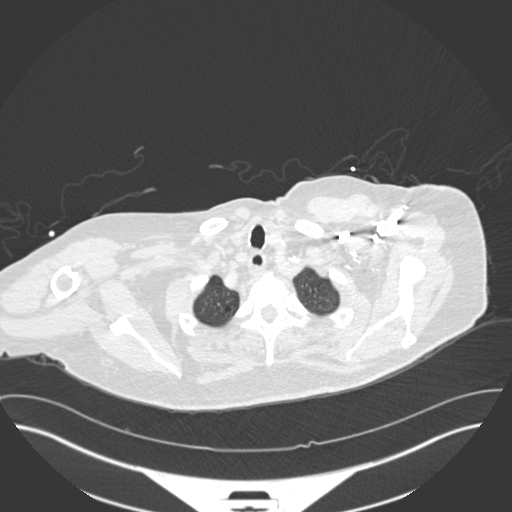
[im 282/295  soft-tissue]
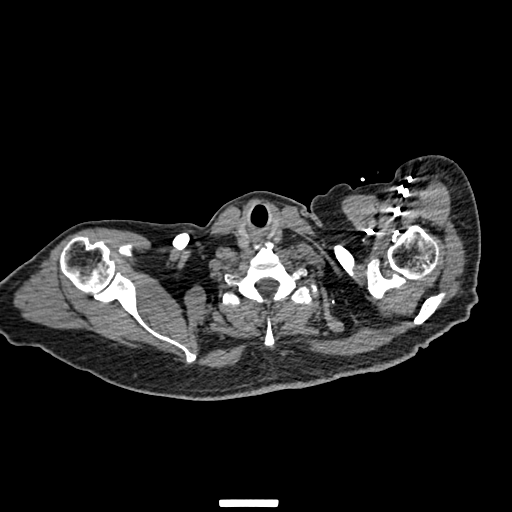

[Series 7: coronal mpr · coronal · 0.58mm/px · 3 of 91 slices shown]
[im 23/91  soft-tissue]
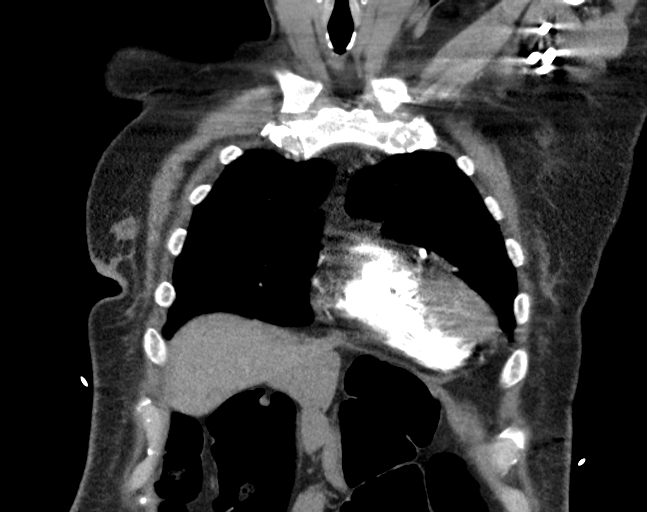
[im 46/91  soft-tissue]
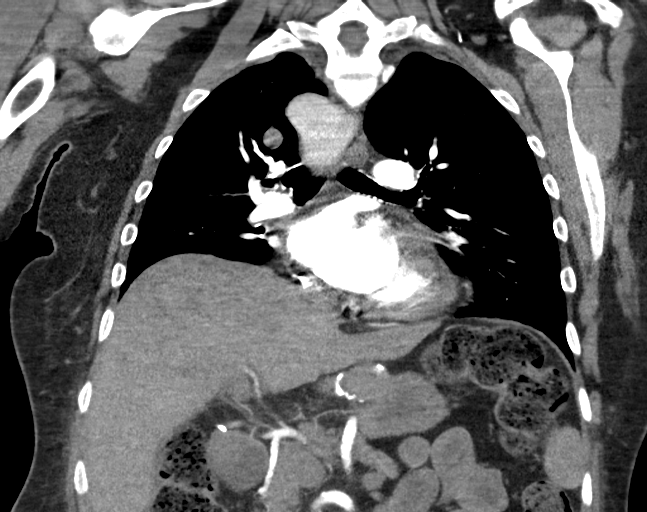
[im 68/91  soft-tissue]
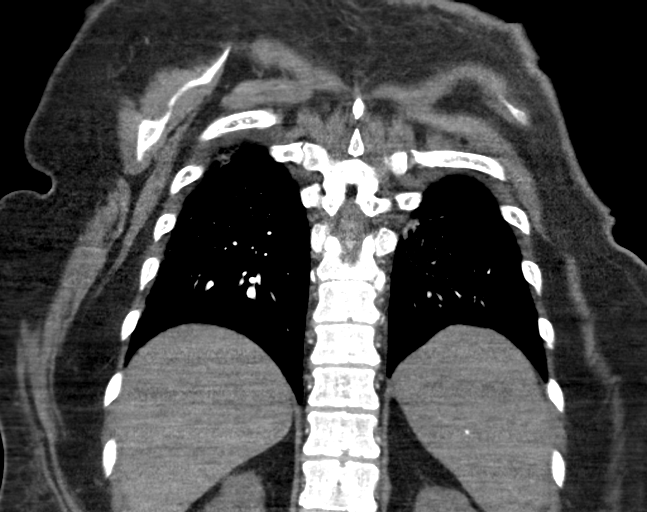

[17 of 46 positions shown; findings below may reference images not displayed]

FINDINGS: Cardiovascular: Satisfactory opacification of the pulmonary arteries
to the segmental level. No evidence of pulmonary embolism.

Heart is normal in size and configuration. No pericardial effusion.
Left coronary artery calcifications. Great vessels are normal in
caliber.

There is a protrusion from the right aspect of the distal aortic
arch. The aortic arch remains on the right. The descending thoracic
aorta also is on the right. These findings are stable from the prior
CT there consistent with a partial or incomplete vascular ring, a
developmental anomaly. There is mild distension of the esophagus
above this.

Mediastinum/Nodes: No neck base, axillary, mediastinal or hilar
masses or enlarged lymph nodes. Surgical clips in the left superior
mediastinum, stable.

Lungs/Pleura: Small area of patchy airspace opacity in the
posterolateral right upper lobe new since the prior CT. Minimal
dependent linear atelectasis in the lower lobes. Remainder of the
lungs is clear. No pleural effusion or pneumothorax.

Upper Abdomen: No acute findings. Stable changes from previous
gastric/hiatal hernia surgery.

Musculoskeletal: No fracture or acute finding.  No bone lesion.

Review of the MIP images confirms the above findings.
IMPRESSION: 1. No evidence of a pulmonary embolism. Specifically, no embolus is
noted to the right upper lobe to correspond to the perfusion defect
noted on the recent pulmonary perfusion exam.
2. Small patchy area of airspace opacity in the posterolateral right
upper lobe. This is consistent with infection in the proper clinical
setting, but could be due to atelectasis.
3. No other evidence of acute cardiopulmonary disease.
4. Developmental anomaly with a right-sided aortic arch and evidence
of a vascular ring. Adjacent vascular clips are consistent with
previous surgical treatment for this anomaly. These findings are
stable from the prior chest CT.
5. Left coronary artery calcifications.

## 2021-10-31 IMAGING — DX DG CHEST 1V
1 series · 1 of 1 positions shown · non-contrast
Comparison: November 05, 2019

CLINICAL DATA: Sleepiness.  Neck pain.

EXAM:
CHEST  1 VIEW

[chest ap]
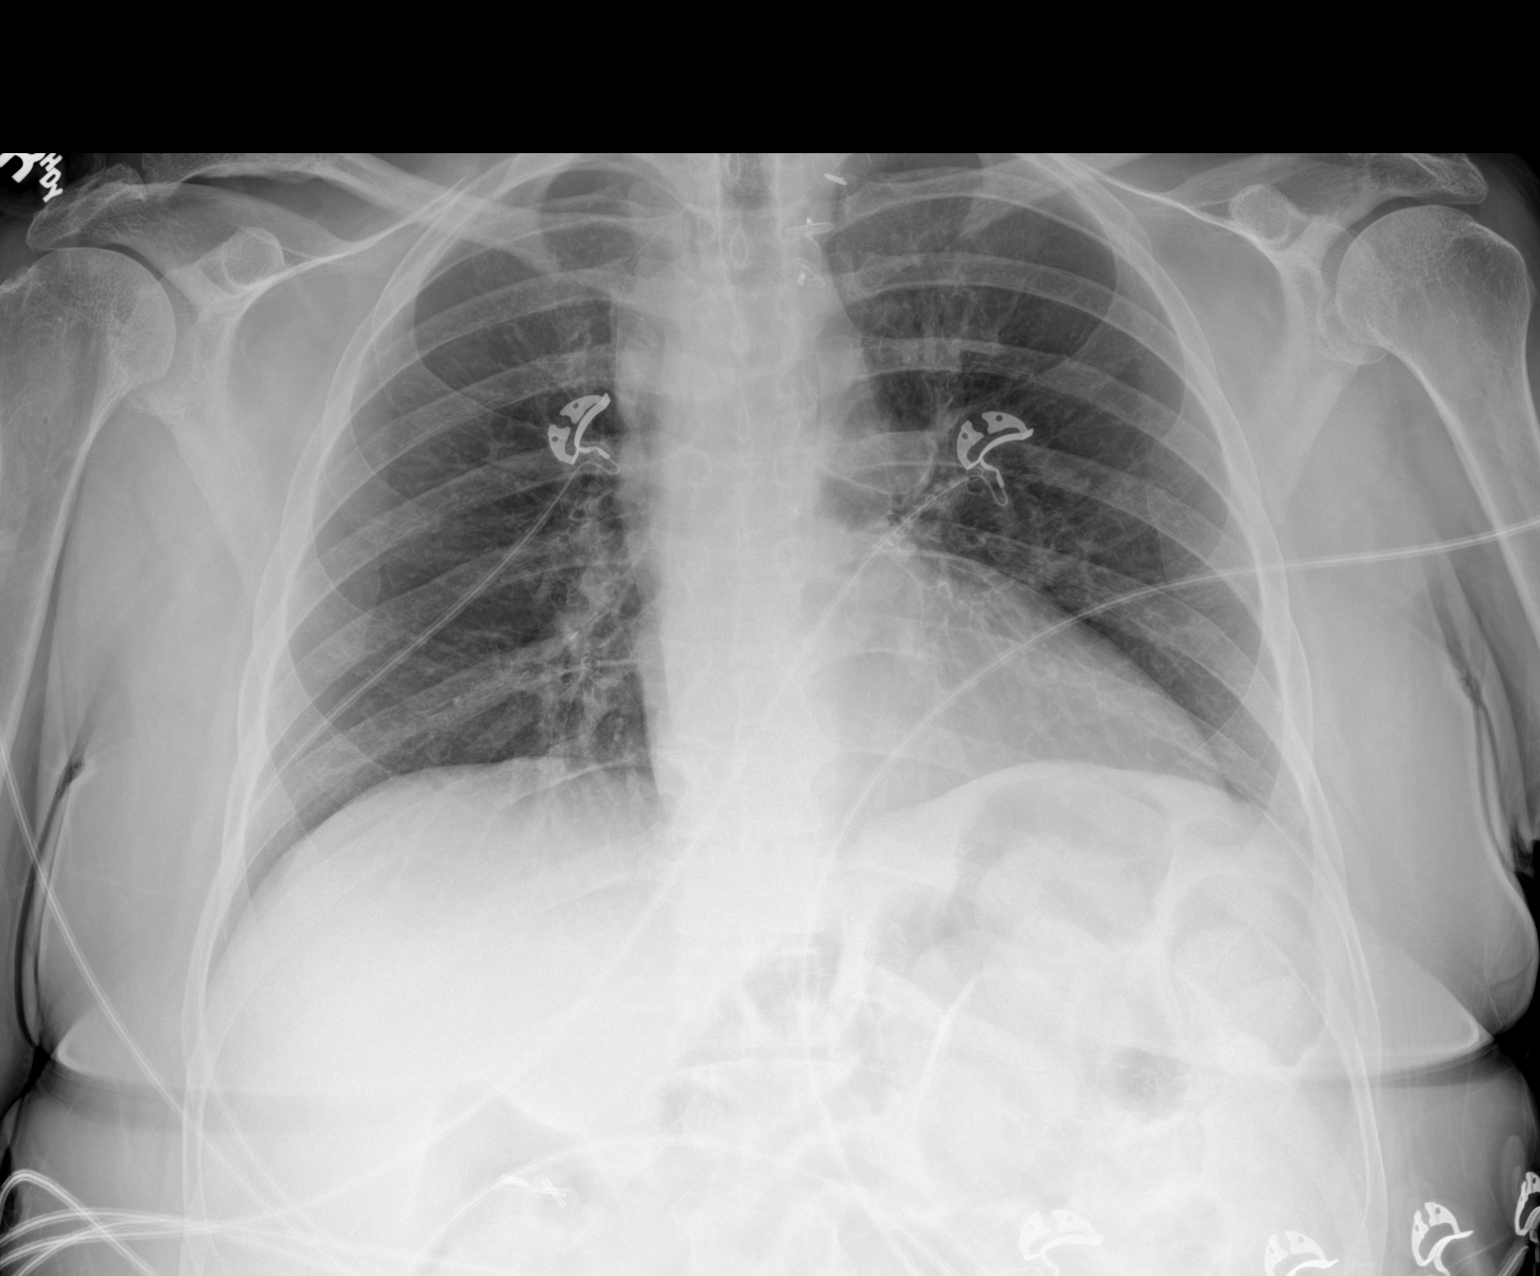

[1 of 1 positions shown; findings below may reference images not displayed]

FINDINGS: The heart size and mediastinal contours are within normal limits.
Both lungs are clear. The visualized skeletal structures are
unremarkable.
IMPRESSION: No active disease.

## 2021-11-09 IMAGING — DX DG KNEE COMPLETE 4+V*L*
5 series · 5 of 5 positions shown · non-contrast
Comparison: Sex LEFT femoral radiographs 01/10/2019

CLINICAL DATA: LEFT knee pain for 3 days after rolling off bed

EXAM:
LEFT KNEE - COMPLETE 4+ VIEW

[knee ap]
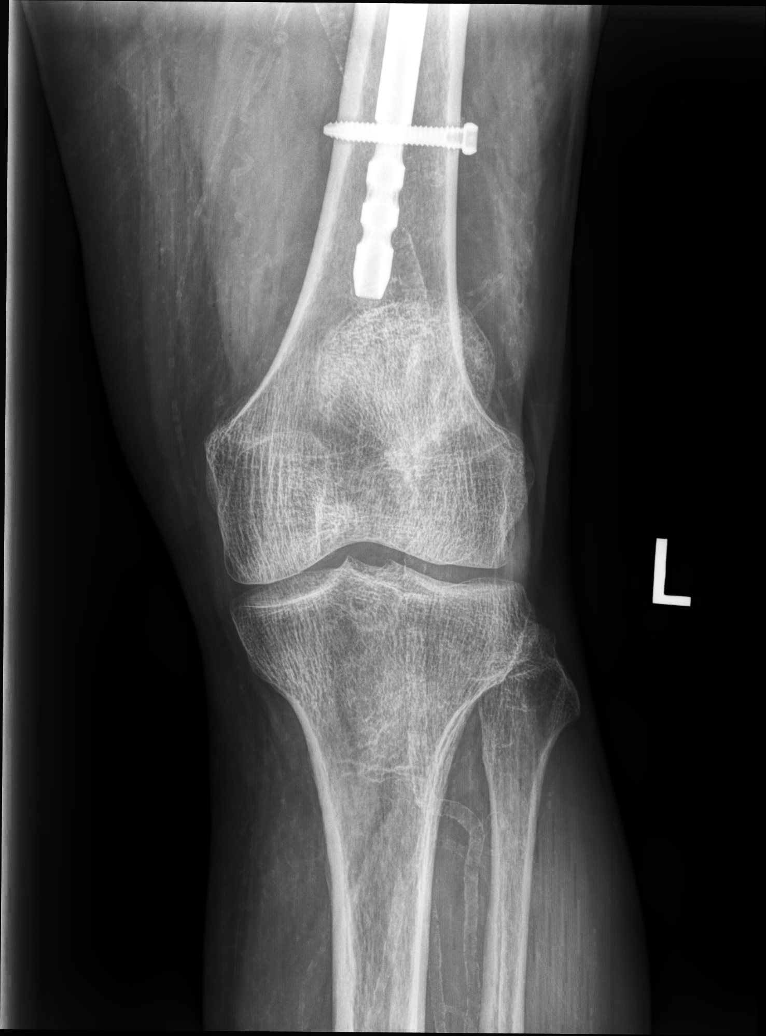

[knee mlo]
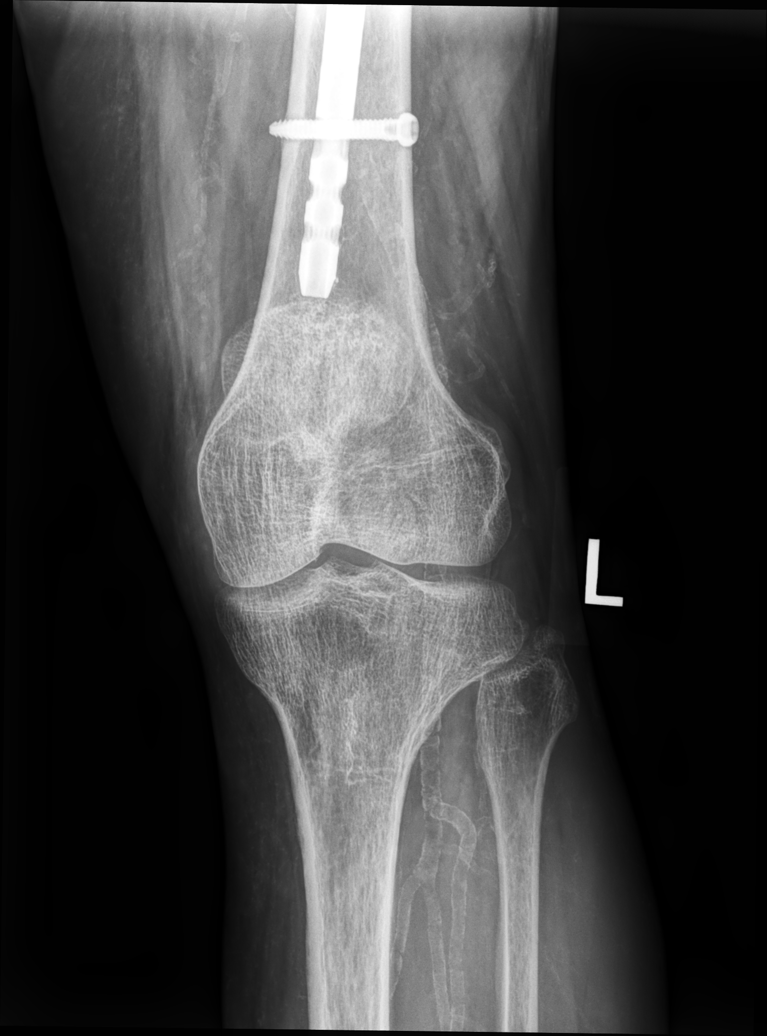

[knee lat]
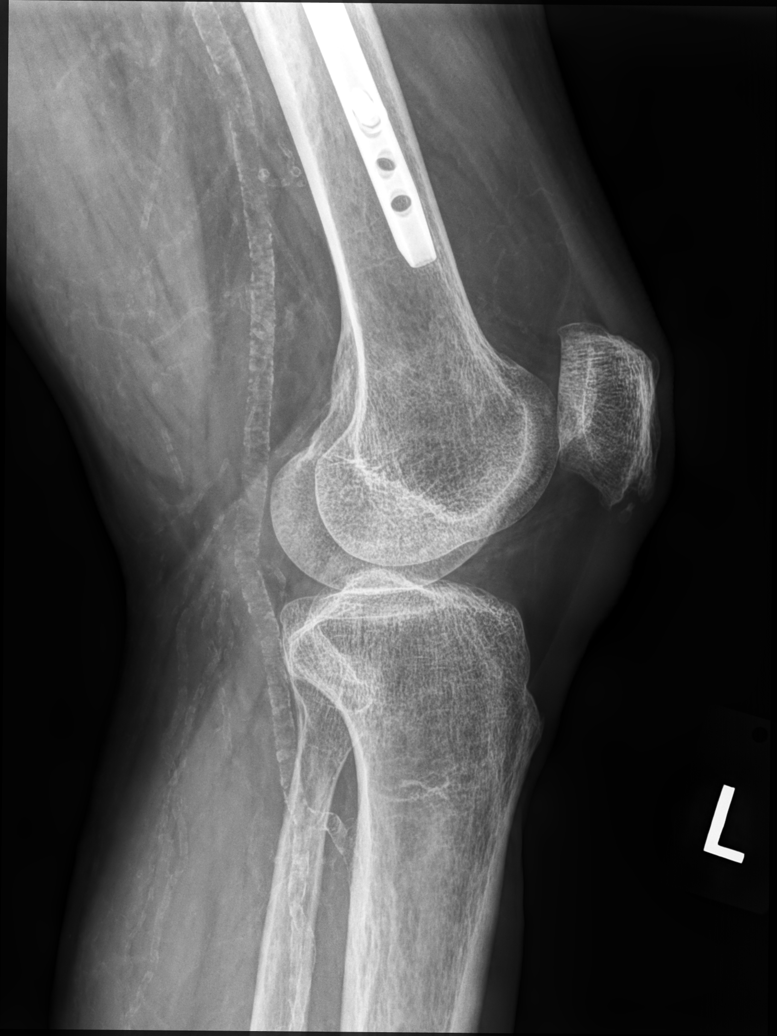

[knee lmo]
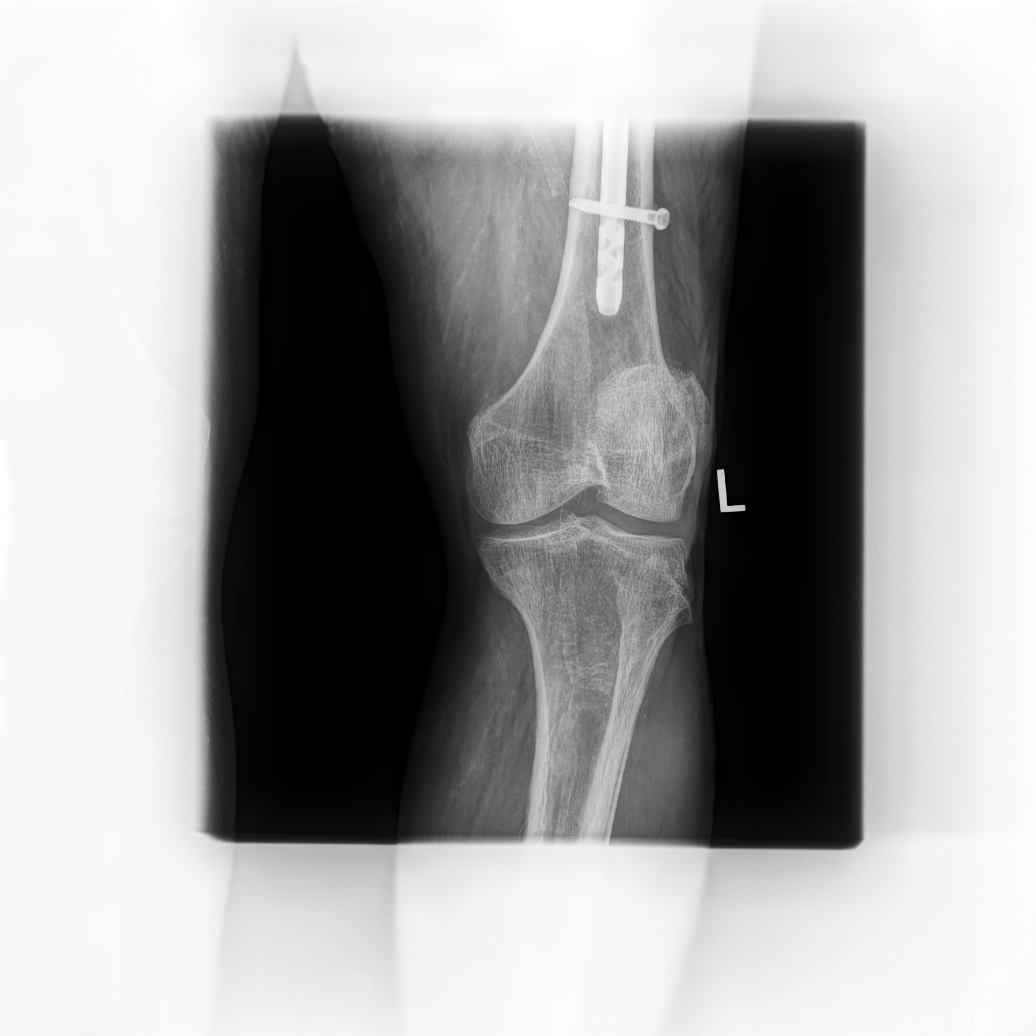

[patella (sunrise) tan]
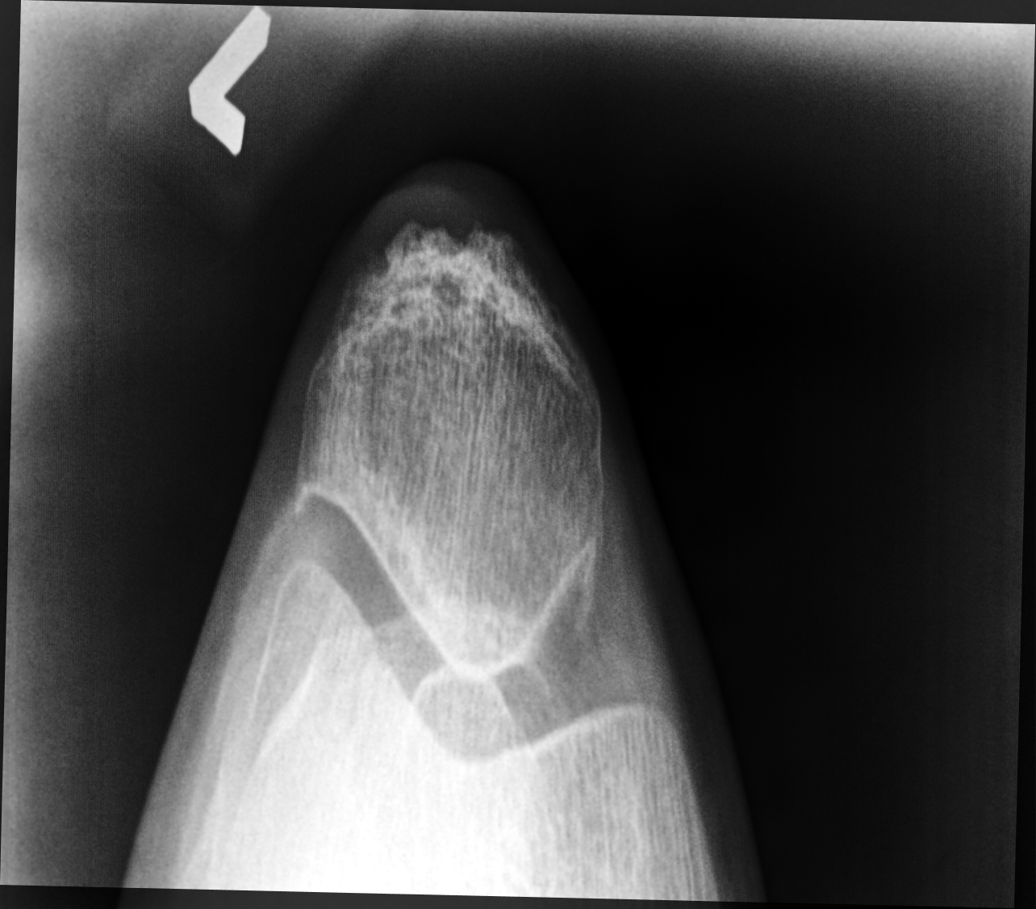

[5 of 5 positions shown; findings below may reference images not displayed]

FINDINGS: IM nail within mid to distal LEFT femur.

Osseous demineralization.

Joint spaces preserved.

No acute fracture, dislocation, or bone destruction.

No joint effusion.

Extensive atherosclerotic calcifications at the distal thigh, knee,
and proximal lower leg.
IMPRESSION: No acute osseous abnormalities.

## 2021-11-24 IMAGING — CR DG CHEST 2V
2 series · 2 of 2 positions shown · non-contrast
Comparison: 11/18/2019.

CLINICAL DATA: Chest pain.

EXAM:
CHEST - 2 VIEW

[chest pa]
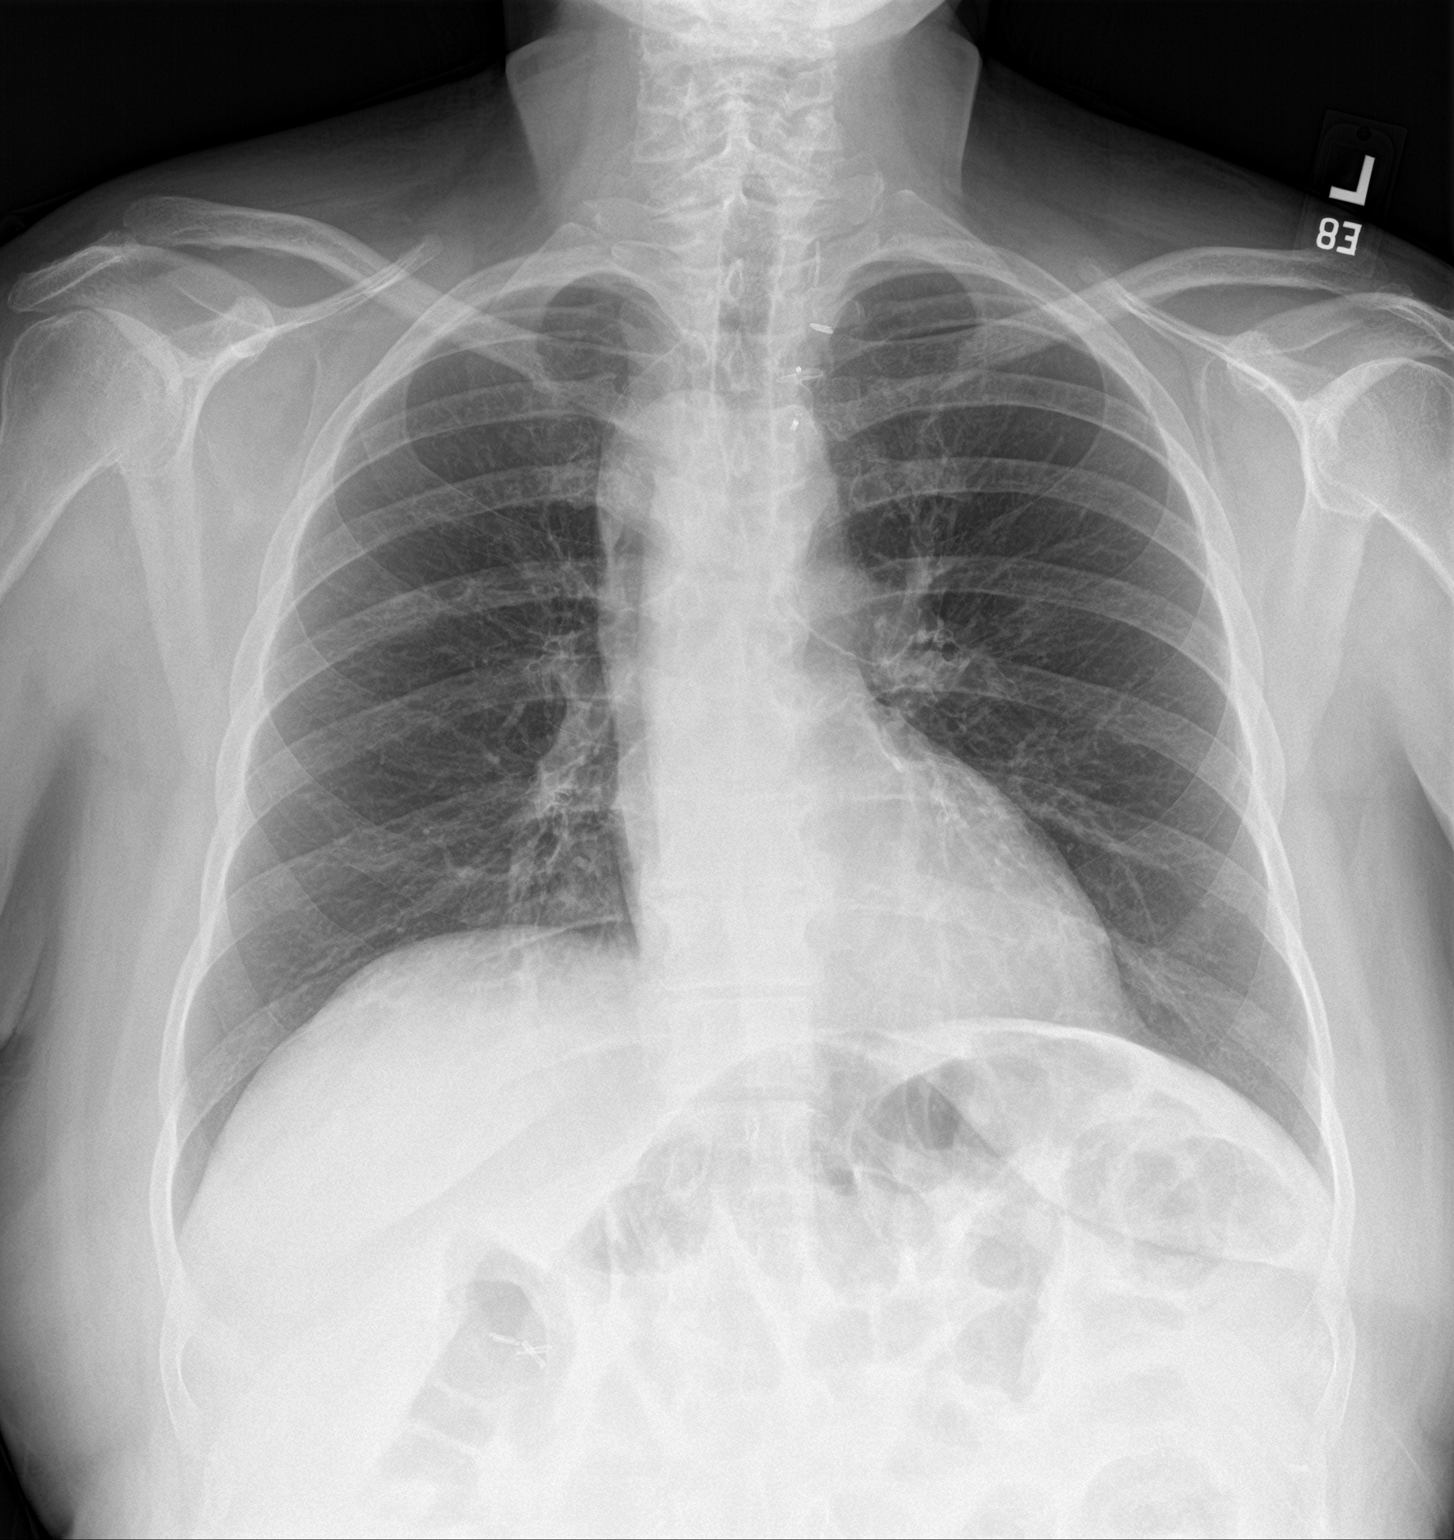

[chest lat]
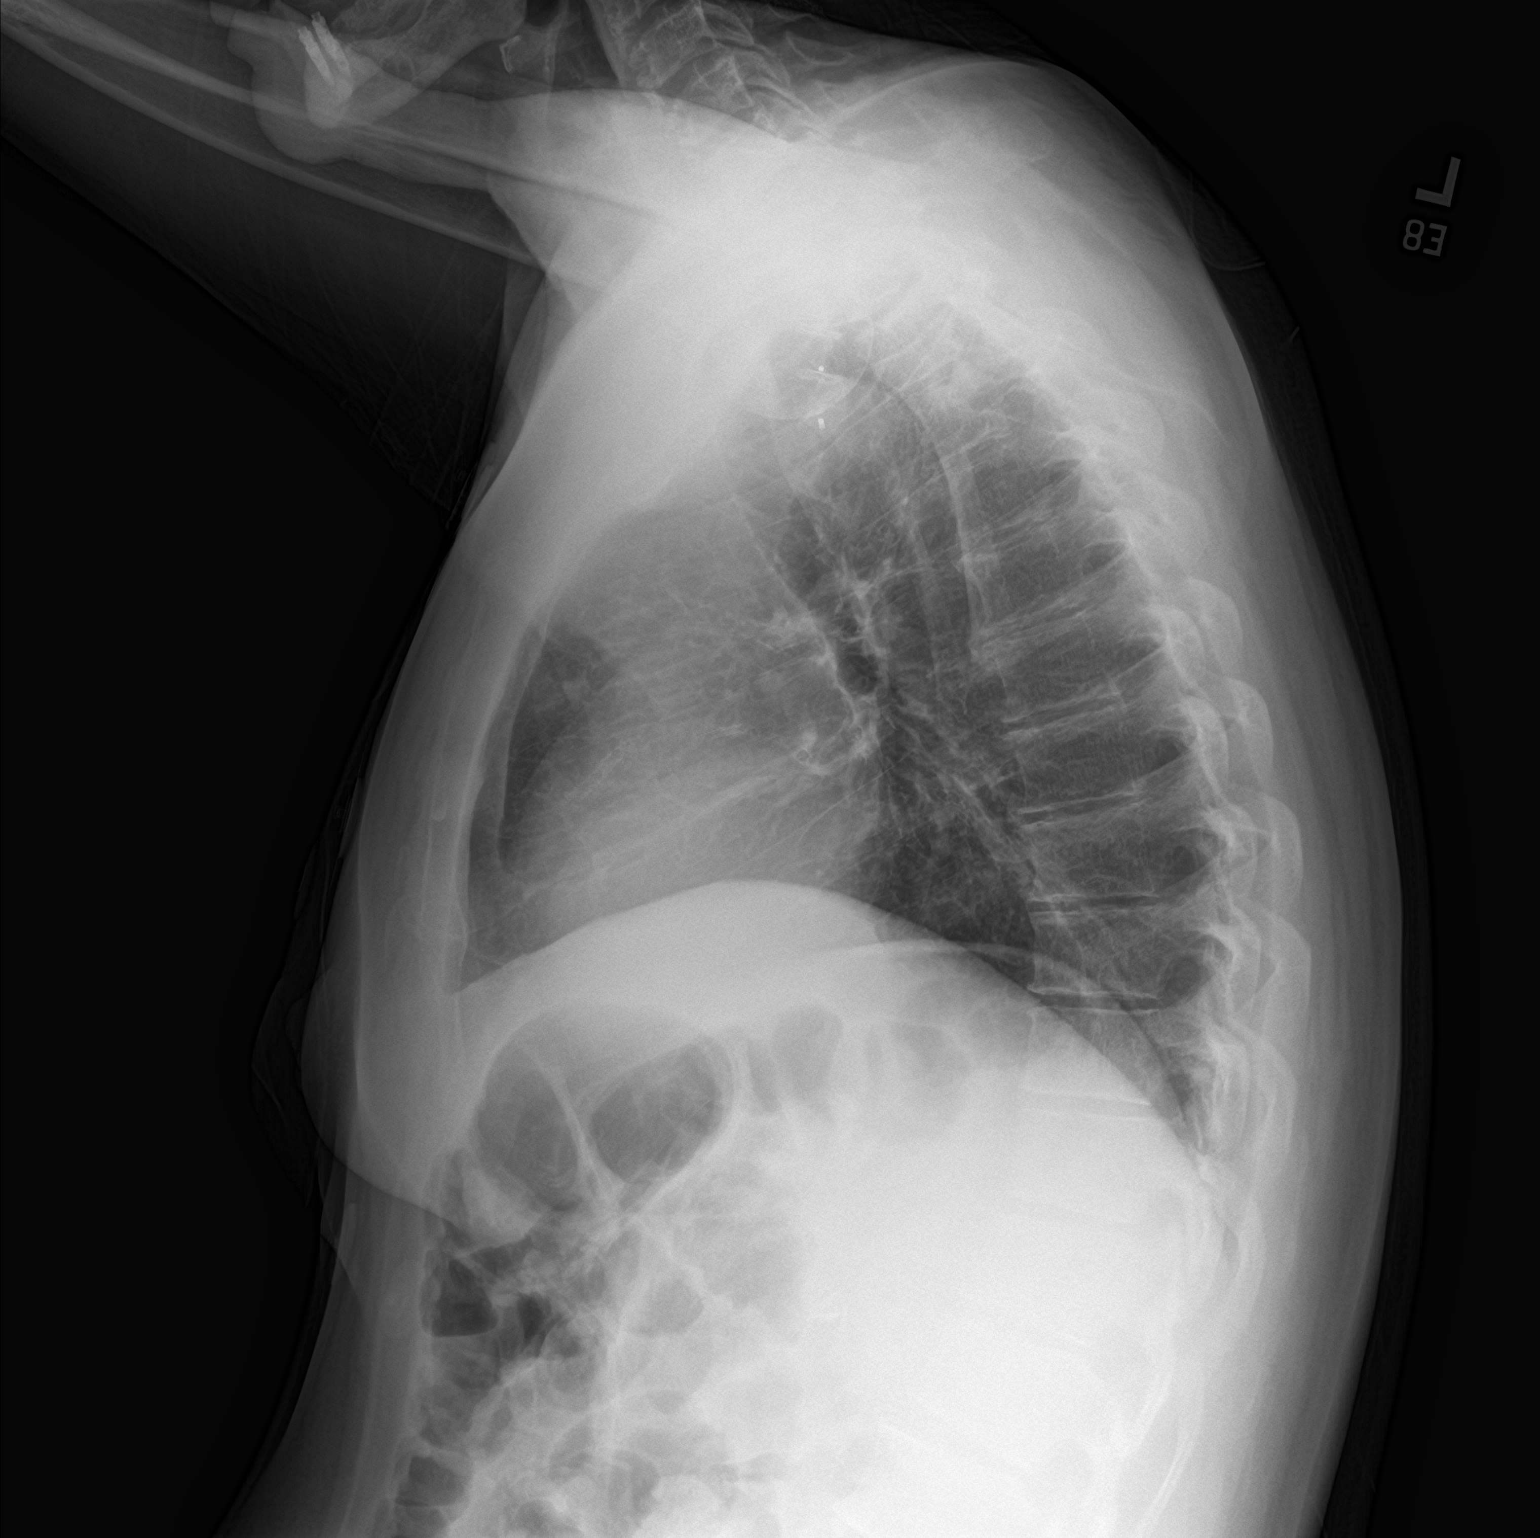

[2 of 2 positions shown; findings below may reference images not displayed]

FINDINGS: Similar cardiac silhouette. Right-sided aortic arch with vascular
ring, better characterized on prior CT chest. Surgical clips project
in the region of the left thoracic inlet. Both lungs are clear. No
visible pleural effusions or pneumothorax. No acute osseous
abnormality. Cholecystectomy clips.
IMPRESSION: No active cardiopulmonary disease.

## 2022-01-02 ENCOUNTER — Encounter (INDEPENDENT_AMBULATORY_CARE_PROVIDER_SITE_OTHER): Payer: Medicare Other

## 2022-01-02 ENCOUNTER — Ambulatory Visit (INDEPENDENT_AMBULATORY_CARE_PROVIDER_SITE_OTHER): Payer: Medicare Other | Admitting: Vascular Surgery

## 2022-01-07 IMAGING — NM NM GI BLOOD LOSS
2 series · 12 of 12 positions shown · non-contrast
Comparison: CT from earlier in the same day.

CLINICAL DATA: Possible duodenal hemorrhage

EXAM:
NUCLEAR MEDICINE GASTROINTESTINAL BLEEDING SCAN
TECHNIQUE: Sequential abdominal images were obtained following intravenous
administration of Vc-HHm labeled red blood cells.
RADIOPHARMACEUTICALS:  19.82 mCi Vc-HHm pertechnetate in-vitro
labeled red cells.

[Series 1000: gi bleed · 4.80mm/px · 6 of 60 frames shown (1 of 2)]
[frame 6/60]
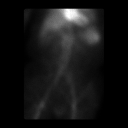
[frame 16/60]
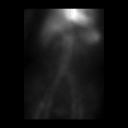
[frame 26/60]
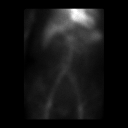
[frame 36/60]
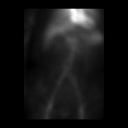
[frame 46/60]
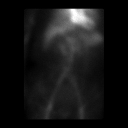
[frame 56/60]
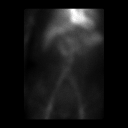

[Series 1000: gi bleed · 4.80mm/px · 6 of 60 frames shown (2 of 2)]
[frame 6/60]
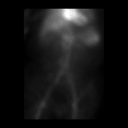
[frame 16/60]
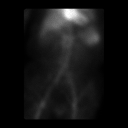
[frame 26/60]
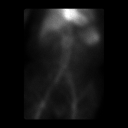
[frame 36/60]
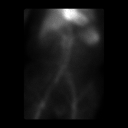
[frame 46/60]
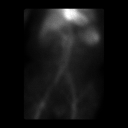
[frame 56/60]
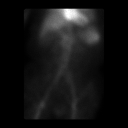

[12 of 12 positions shown; findings below may reference images not displayed]

FINDINGS: Initial images demonstrate opacification of the cardiac blood pool
is well as the liver and spleen. Increased uptake is noted in the
expected region of the residual stomach following gastric bypass
surgery. Although this may represent free technetium the possibility
of acute hemorrhage in the excluded segment deserves consideration.
Imaging into the second hour shows passage of tracer into the
proximal duodenum in the area of concern on recent CT examination.
IMPRESSION: Uptake within the residual excluded stomach which subsequently
passes into the duodenum. Although this may represent free
technetium, given the findings on recent CT, bleeding gastric ulcer
deserves consideration.

## 2022-01-07 IMAGING — CT CT ABD-PELV W/O CM
2 of 4 series · 15 of 46 positions shown, 17 images · non-contrast
Comparison: MRI abdomen dated 09/05/2019. CT abdomen/pelvis dated
03/19/2017.

CLINICAL DATA: Diarrhea, weakness, on Plavix/aspirin

EXAM:
CT ABDOMEN AND PELVIS WITHOUT CONTRAST
TECHNIQUE: Multidetector CT imaging of the abdomen and pelvis was performed
following the standard protocol without IV contrast.

[Series 2: routine abd/pel wo · axial · 0.78mm/px · z∈[-1022,-552]mm · 12 of 104 slices shown, 14 images]
[im 5/104  soft-tissue]
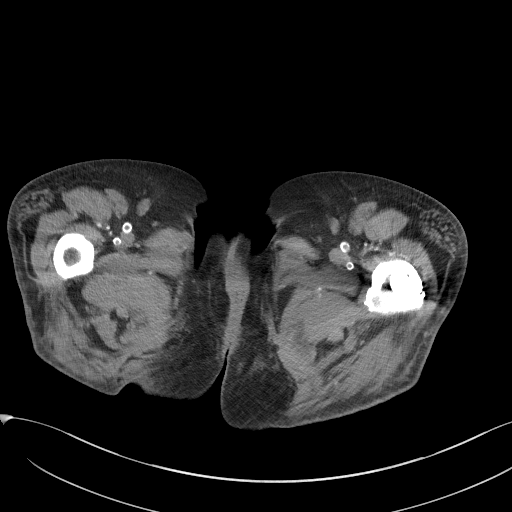
[im 5/104  bone]
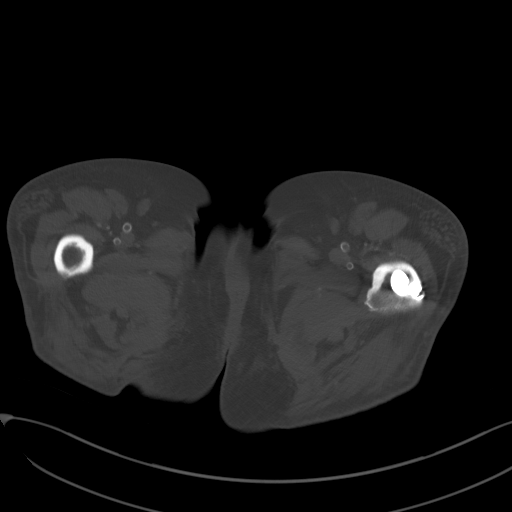
[im 14/104  soft-tissue]
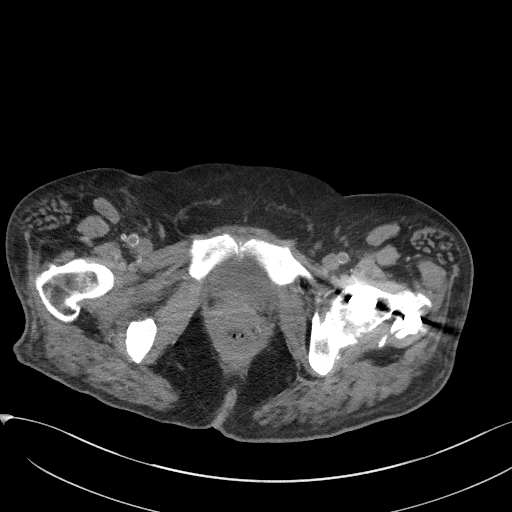
[im 23/104  soft-tissue]
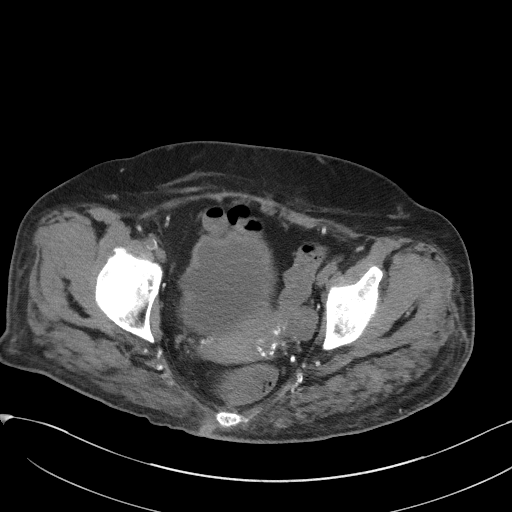
[im 32/104  soft-tissue]
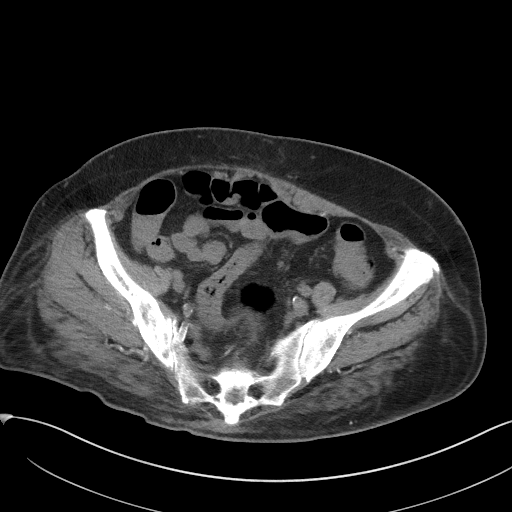
[im 41/104  soft-tissue]
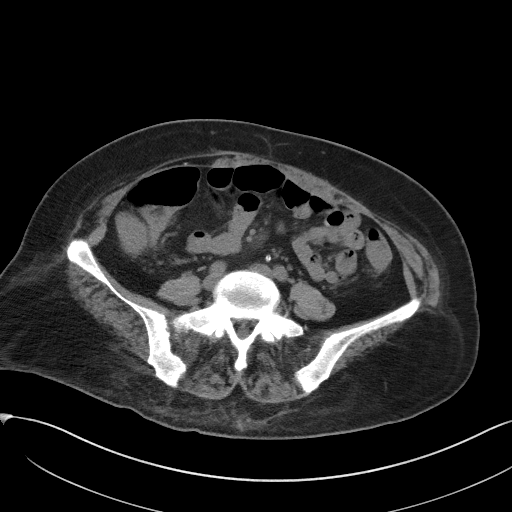
[im 50/104  soft-tissue]
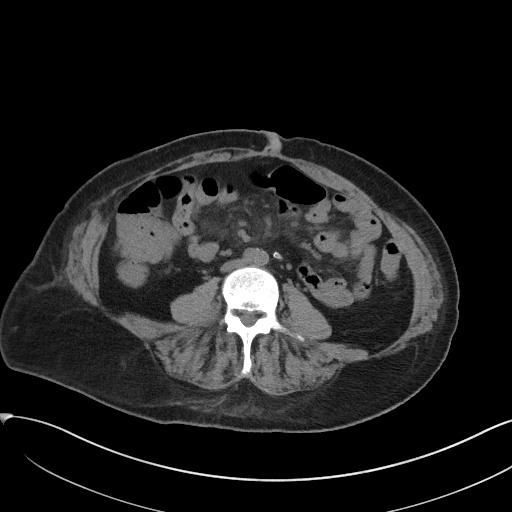
[im 54/104  soft-tissue]
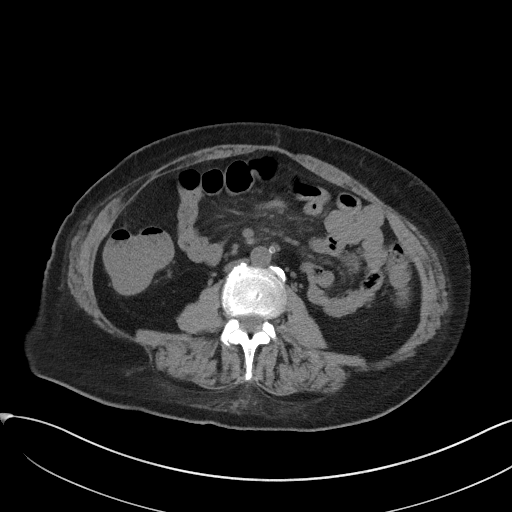
[im 63/104  soft-tissue]
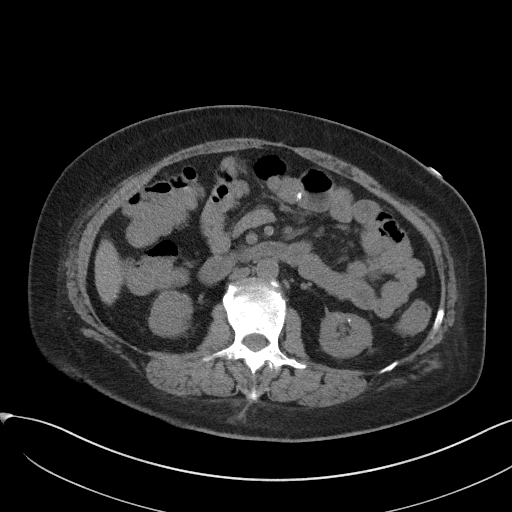
[im 72/104  soft-tissue]
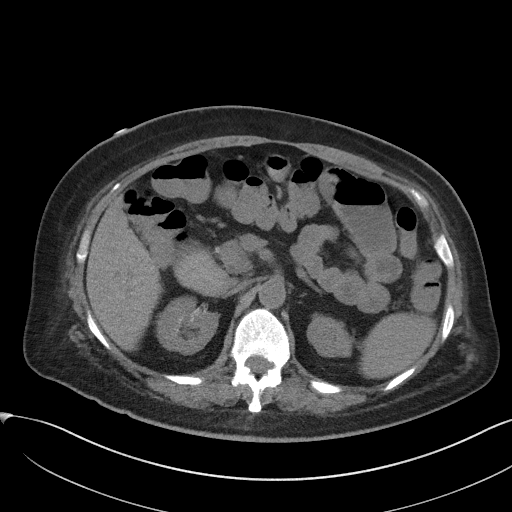
[im 72/104  bone]
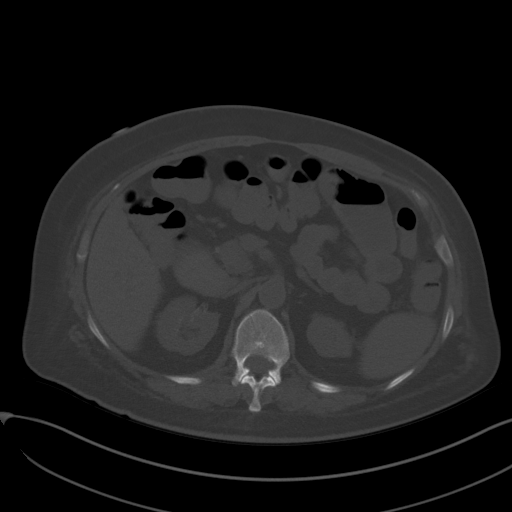
[im 81/104  soft-tissue]
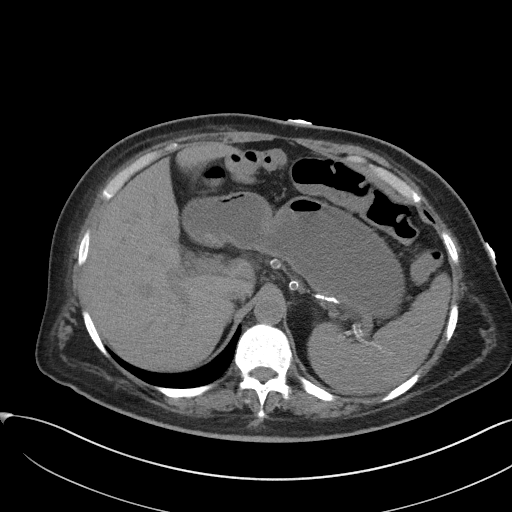
[im 90/104  soft-tissue]
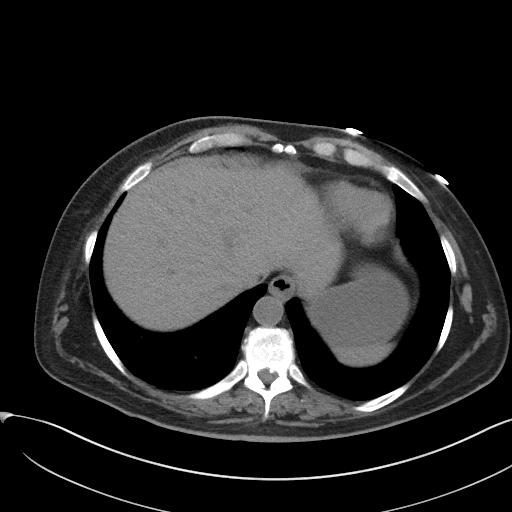
[im 99/104  soft-tissue]
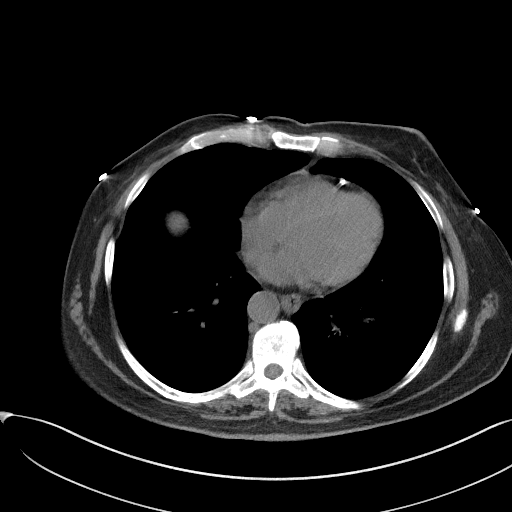

[Series 5: coronal st · coronal · 0.81mm/px · 3 of 91 slices shown]
[im 31/91  soft-tissue]
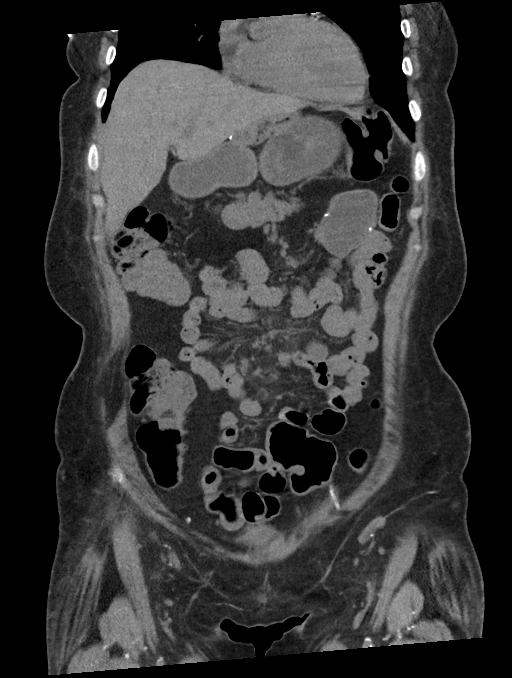
[im 41/91  soft-tissue]
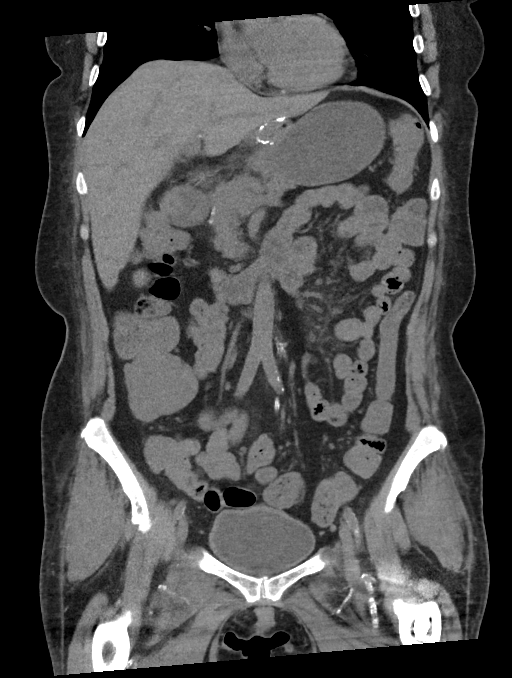
[im 51/91  soft-tissue]
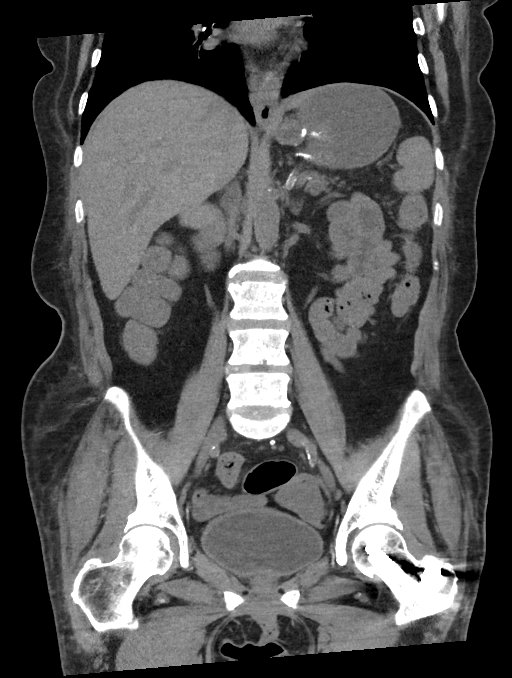

[15 of 46 positions shown; findings below may reference images not displayed]

FINDINGS: Lower chest: Lung bases are clear.

Hepatobiliary: Unenhanced liver is unremarkable.

Status post cholecystectomy. No intrahepatic or extrahepatic ductal
dilatation.

Pancreas: Within normal limits.

Spleen: Within normal limits.

Adrenals/Urinary Tract: Adrenal glands are within normal limits.

Kidneys are notable for renal vascular calcifications but are
otherwise unremarkable. No hydronephrosis.

Bladder is mildly thick-walled/trabeculated.

Stomach/Bowel: Status post gastric bypass.

Distension of the excluded stomach, reflecting some degree of
functional obstruction, with an intramural high density lesion in
the proximal duodenum measuring 3.5 x 4.2 cm (series 2/image 32),
favoring intraluminal duodenal hemorrhage. A pre-existing mass is
not identified (even in retrospect) when correlating with prior MR,
although an underlying ulcer or small lesion is certainly possible.

Bowel is otherwise unremarkable, without evidence of small bowel
obstruction.

Appendix is not discretely visualized.

Vascular/Lymphatic: No evidence of abdominal aortic aneurysm.

Atherosclerotic calcifications of the abdominal aorta and branch
vessels.

No suspicious abdominopelvic lymphadenopathy.

Reproductive: Uterus is within normal limits.

Left ovary is within normal limits.  No right adnexal mass.

Other: No abdominopelvic ascites.

Musculoskeletal: Status post ORIF of the left hip. Very mild
degenerative changes of the lumbar spine.
IMPRESSION: Status post gastric bypass. Distension of the excluded stomach,
reflecting some degree of functional obstruction.

Associated 3.5 x 4.2 cm intramural high density lesion in the
proximal duodenum, favoring intraluminal duodenal hemorrhage. A
pre-existing mass is not identified on the prior MR, although an
underlying ulcer or small lesion is certainly possible.

Given the patient's postsurgical anatomy, it is unclear that direct
visualization of this region is possible endoscopically. If this is
not possible, consider follow-up MR enterography in 4 weeks.

## 2022-02-19 IMAGING — NM NM GI BLOOD LOSS
2 series · 12 of 12 positions shown · non-contrast
Comparison: 01/25/2020

CLINICAL DATA: GI bleed, post gastric bypass surgery 1999, patient
states she is still bleeding and passing dark stool

EXAM:
NUCLEAR MEDICINE GASTROINTESTINAL BLEEDING SCAN
TECHNIQUE: Sequential abdominal images were obtained following intravenous
administration of Fc-PPm labeled red blood cells.
RADIOPHARMACEUTICALS:  22.015 mCi Fc-PPm pertechnetate in-vitro
UltraTag labeled red cells.

[Series 1000: hour 1 gi bleed · 4.80mm/px · 6 of 60 frames shown]
[frame 6/60]
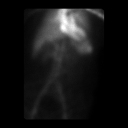
[frame 16/60]
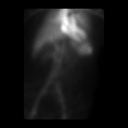
[frame 26/60]
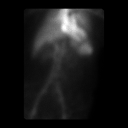
[frame 36/60]
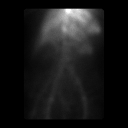
[frame 46/60]
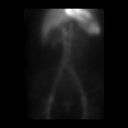
[frame 56/60]
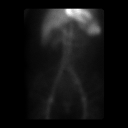

[Series 1000: gi bleed hour 2 · 4.80mm/px · 6 of 60 frames shown]
[frame 6/60]
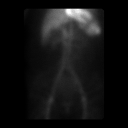
[frame 16/60]
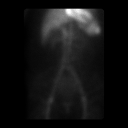
[frame 26/60]
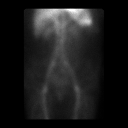
[frame 36/60]
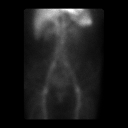
[frame 46/60]
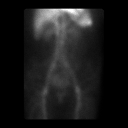
[frame 56/60]
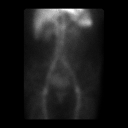

[12 of 12 positions shown; findings below may reference images not displayed]

FINDINGS: Normal blood pool distribution of tracer.

No abnormal gastrointestinal localization of tracer identified to
suggest active GI bleeding.

Small amount of the labeled tracer within urinary bladder.
IMPRESSION: Negative GI bleeding scan.

## 2022-03-14 IMAGING — CR DG HIP (WITH OR WITHOUT PELVIS) 2-3V*L*
1 series · 3 of 3 positions shown · non-contrast
Comparison: Femur radiograph January 10, 2019.

CLINICAL DATA: Pain after fall down Abdilnuri on [REDACTED]

EXAM:
DG HIP (WITH OR WITHOUT PELVIS) 2-3V LEFT

[Series 1: dg hip unilat w or w/o pelvis 2-3 views  · non-contrast · 0.14mm/px · 3 of 3 slices shown]
[im 1/3]
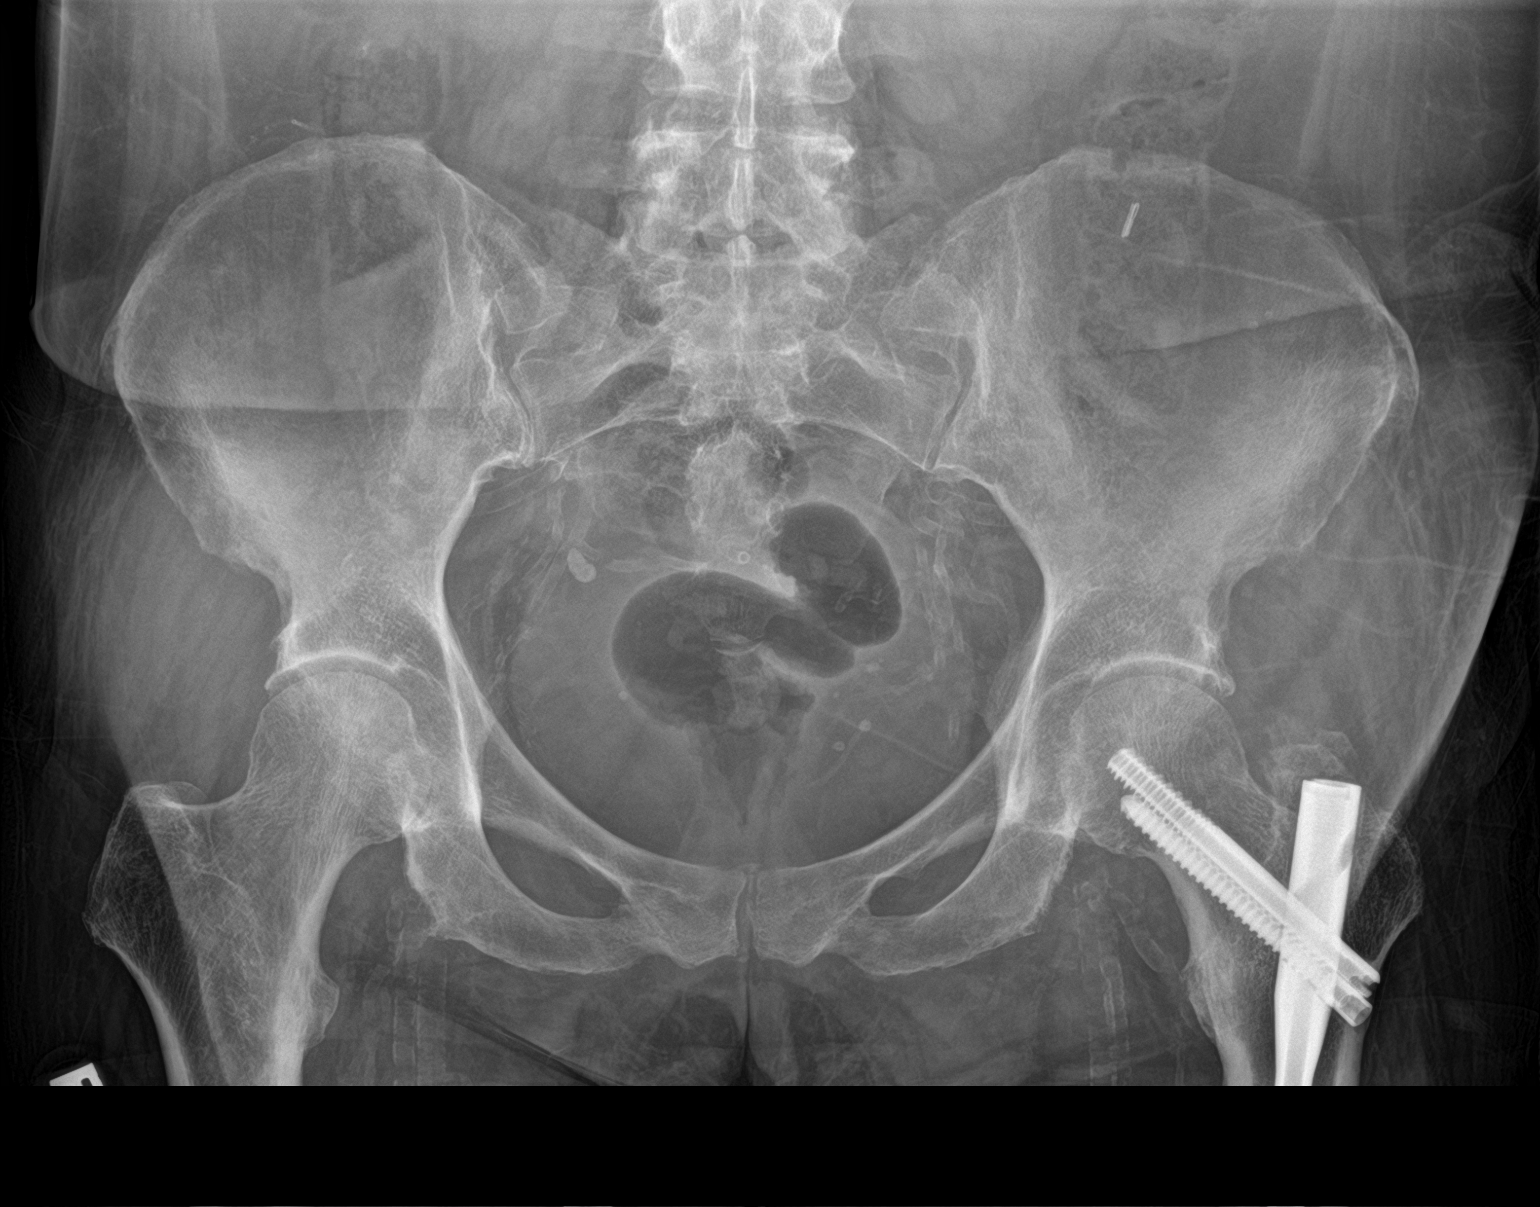
[im 2/3]
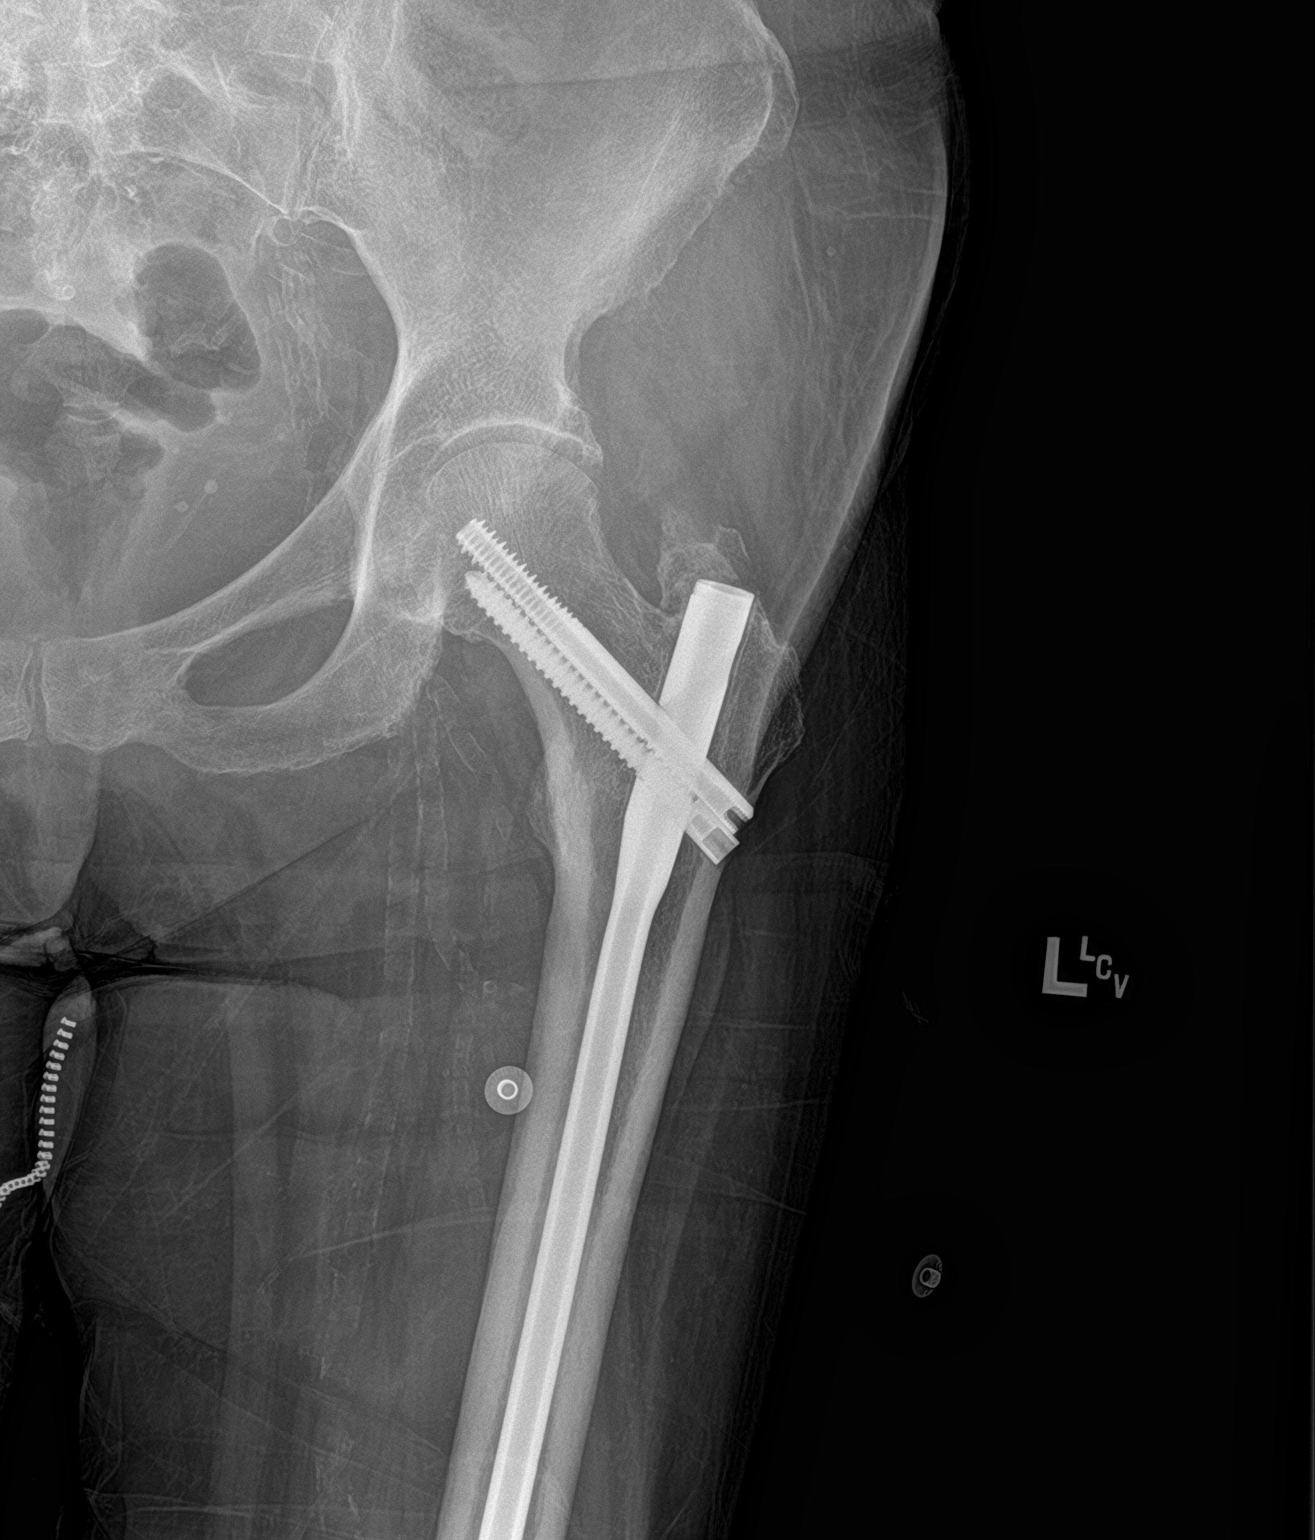
[im 3/3]
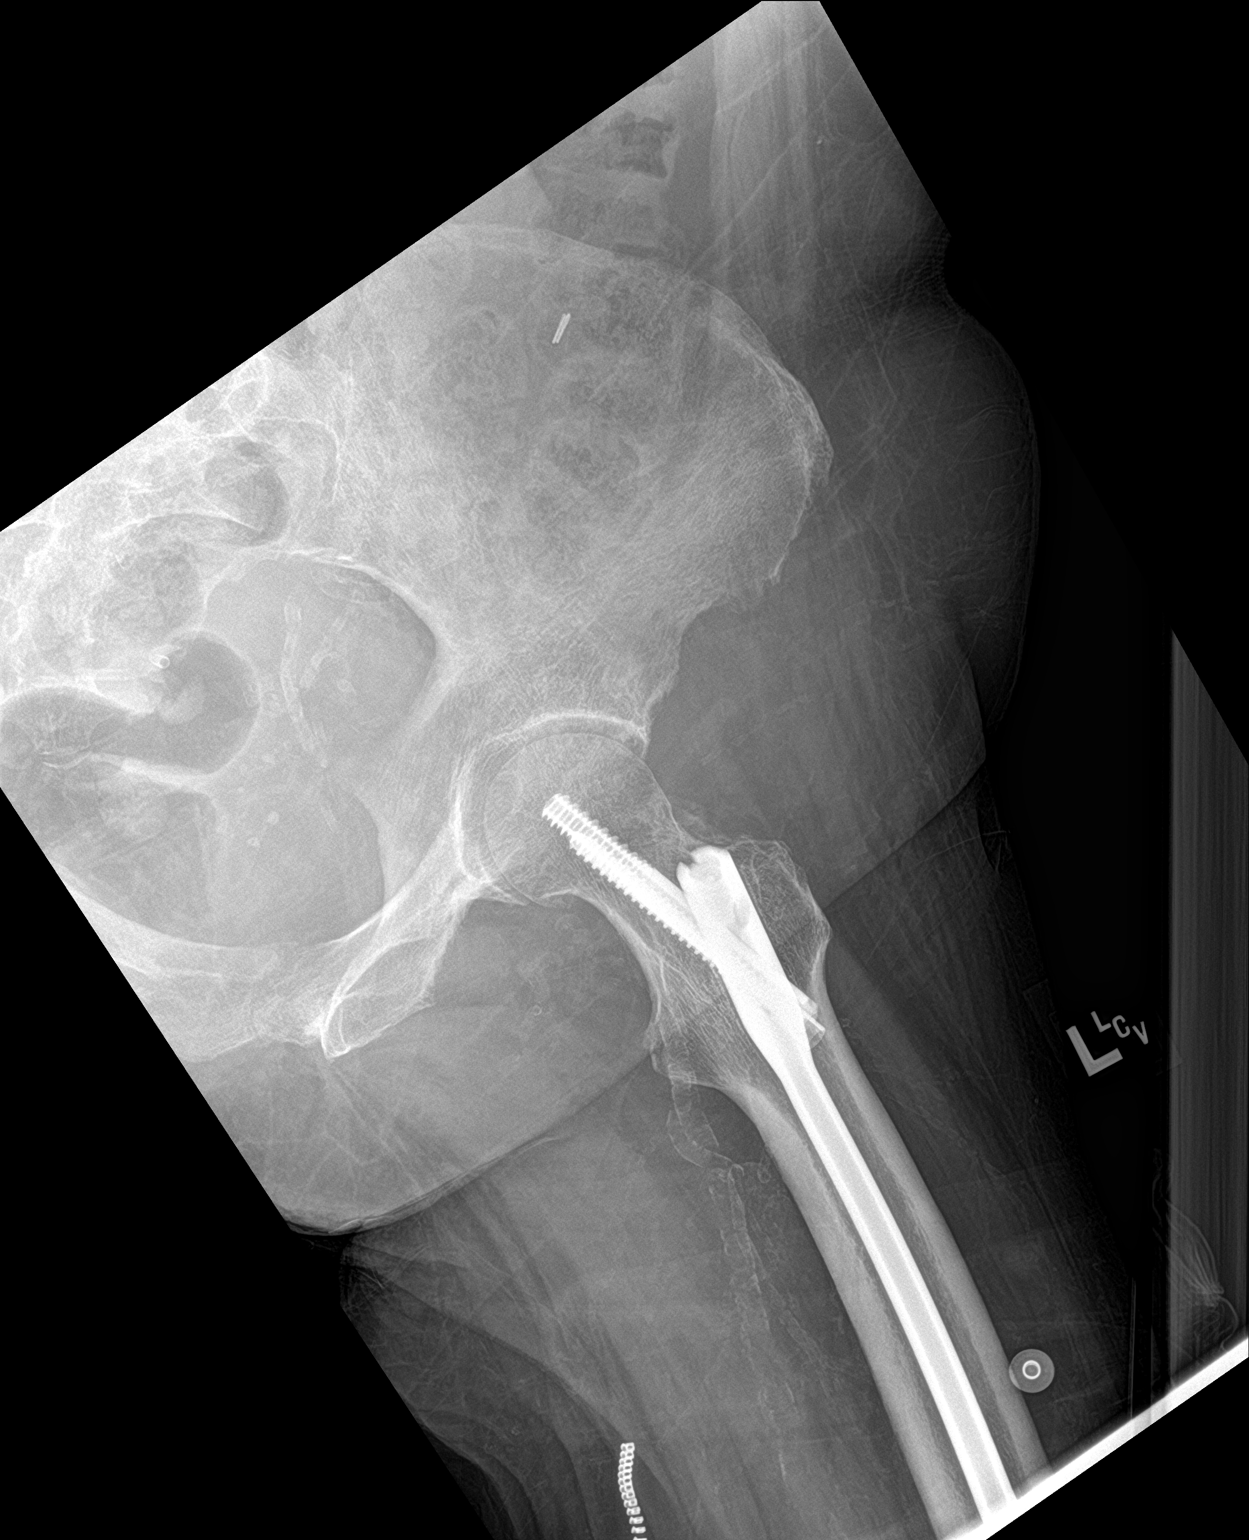

[3 of 3 positions shown; findings below may reference images not displayed]

FINDINGS: Partially visualized intramedullary rod and nail fixation of the
femur. There is no evidence of hip fracture or dislocation. There is
no evidence of arthropathy or other focal bone abnormality. Pelvic
phleboliths. Vascular calcifications. Surgical clip overlies the
left lower quadrant.
IMPRESSION: No acute osseous abnormality.

## 2022-03-14 IMAGING — CR DG LUMBAR SPINE 2-3V
1 series · 3 of 3 positions shown · non-contrast
Comparison: Lumbar spine radiographs November 04, 2017

CLINICAL DATA: Pain after fall down Bixer on [REDACTED]

EXAM:
LUMBAR SPINE - 2-3 VIEW

[Series 1: dg lumbar spine 2-3 views · 0.14mm/px · 3 of 3 slices shown]
[im 1/3]
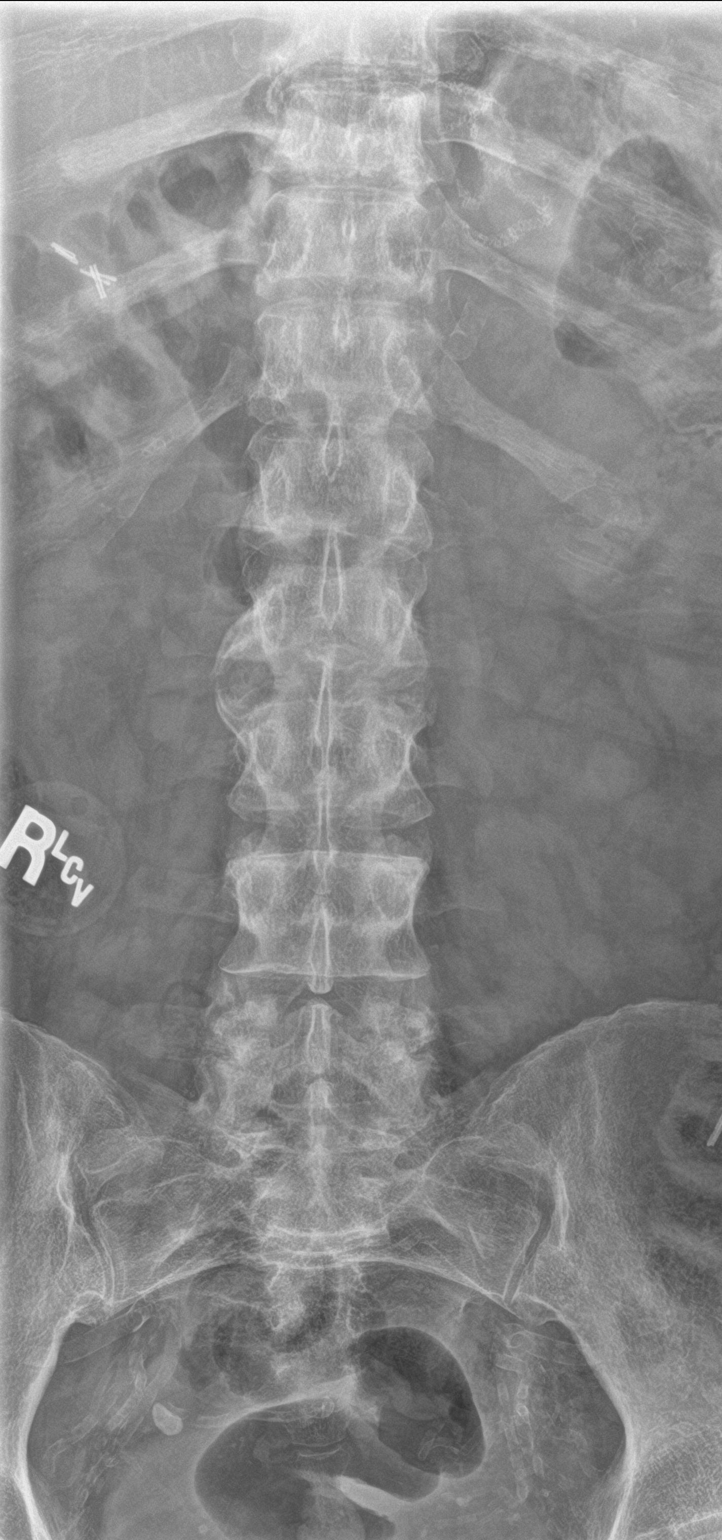
[im 2/3]
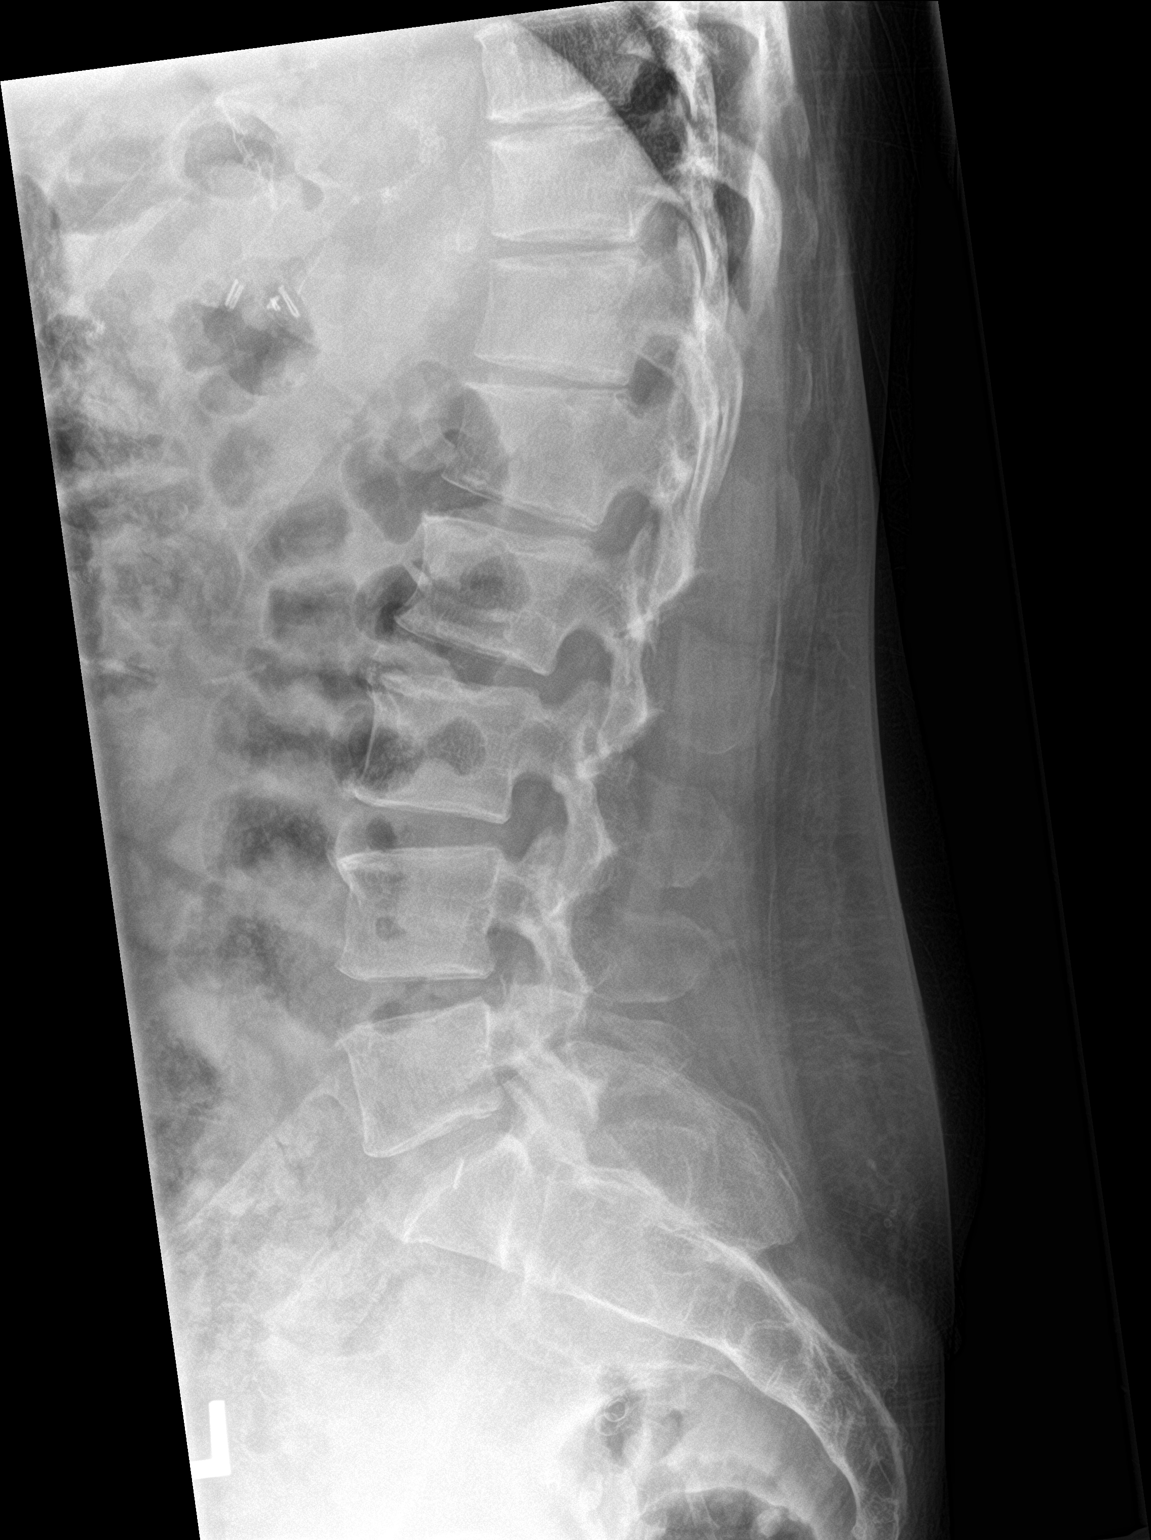
[im 3/3]
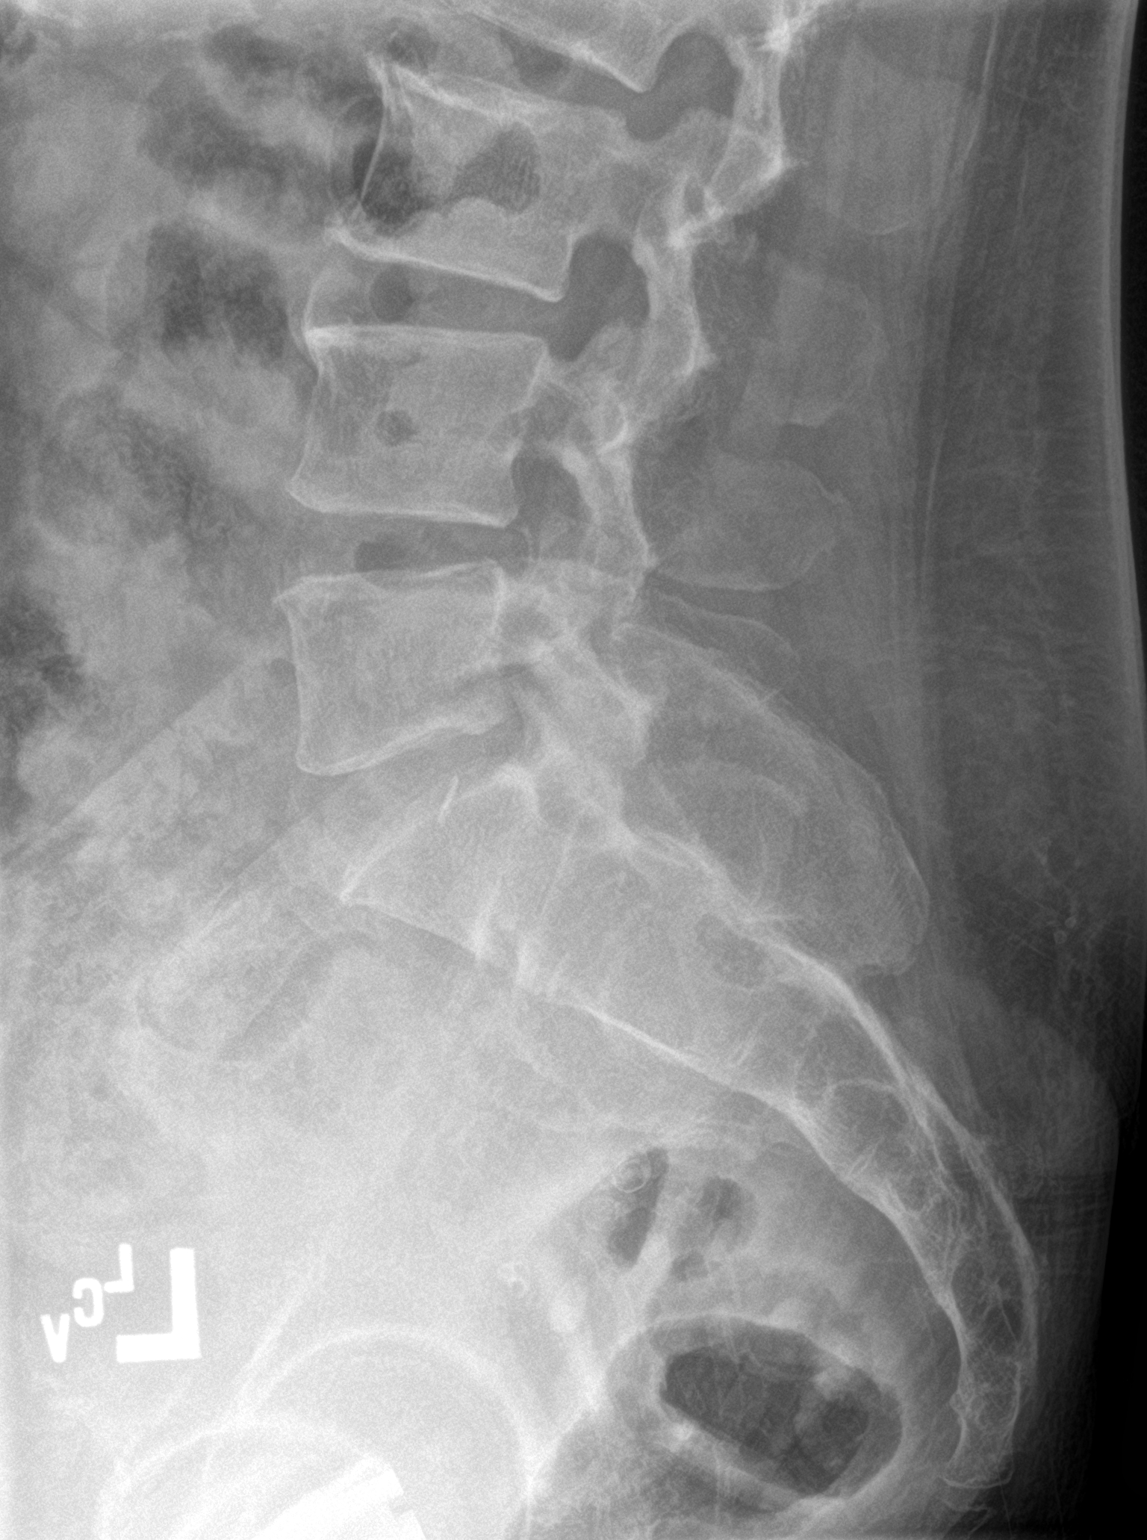

[3 of 3 positions shown; findings below may reference images not displayed]

FINDINGS: There is no evidence of lumbar spine fracture. Alignment is normal.
Similar mild degenerative change. Cholecystectomy clips. Surgical
clip overlies the left lower quadrant.
IMPRESSION: No acute osseous abnormality. Mild degenerative changes, similar to
prior studies

## 2022-03-14 IMAGING — CR DG RIBS 2V*R*
1 series · 4 of 4 positions shown · non-contrast
Comparison: Chest radiograph December 12, 2019

CLINICAL DATA: Pain after fall down Sou on [REDACTED]

EXAM:
RIGHT RIBS - 2 VIEW

[Series 1: dg ribs unilateral w/chest left · 0.14mm/px · 4 of 4 slices shown]
[im 1/4]
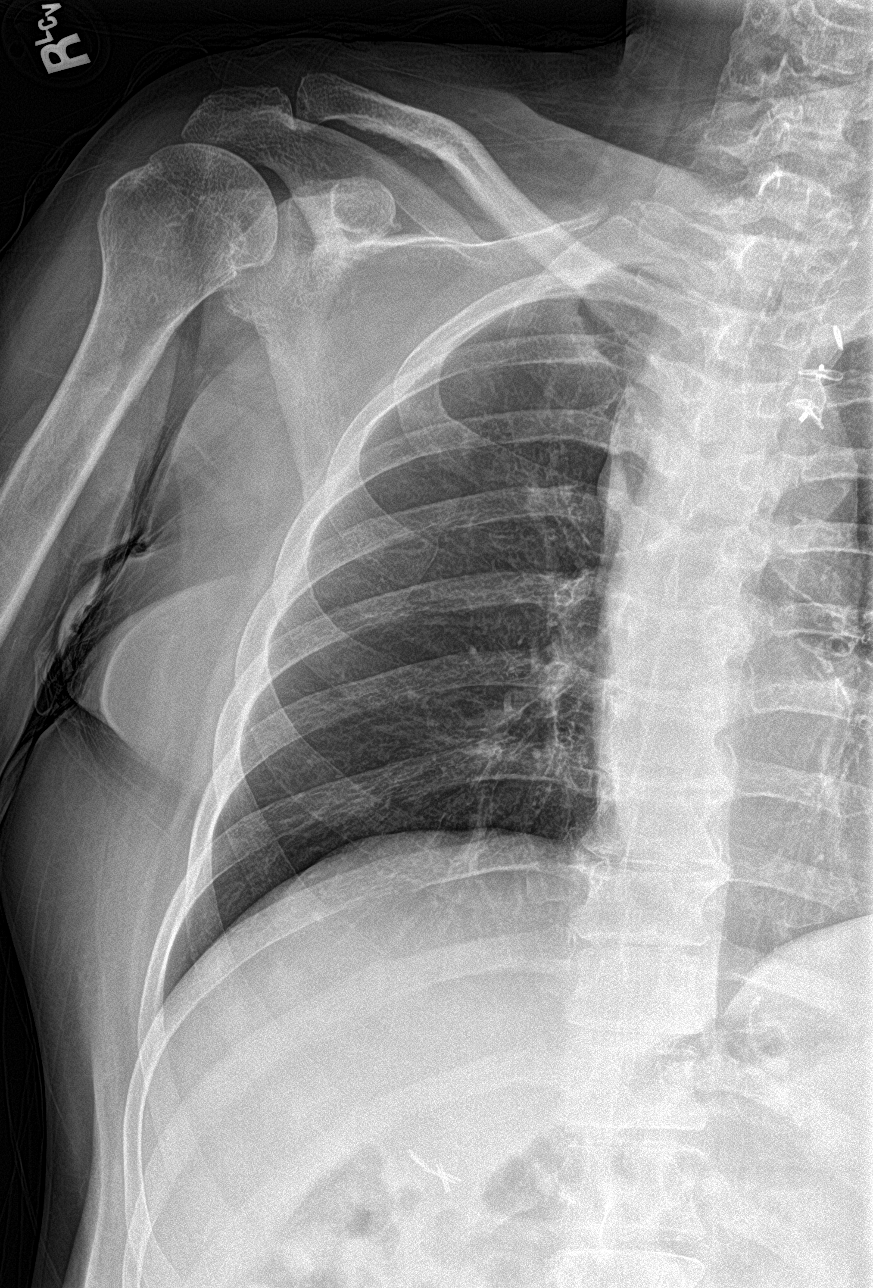
[im 2/4]
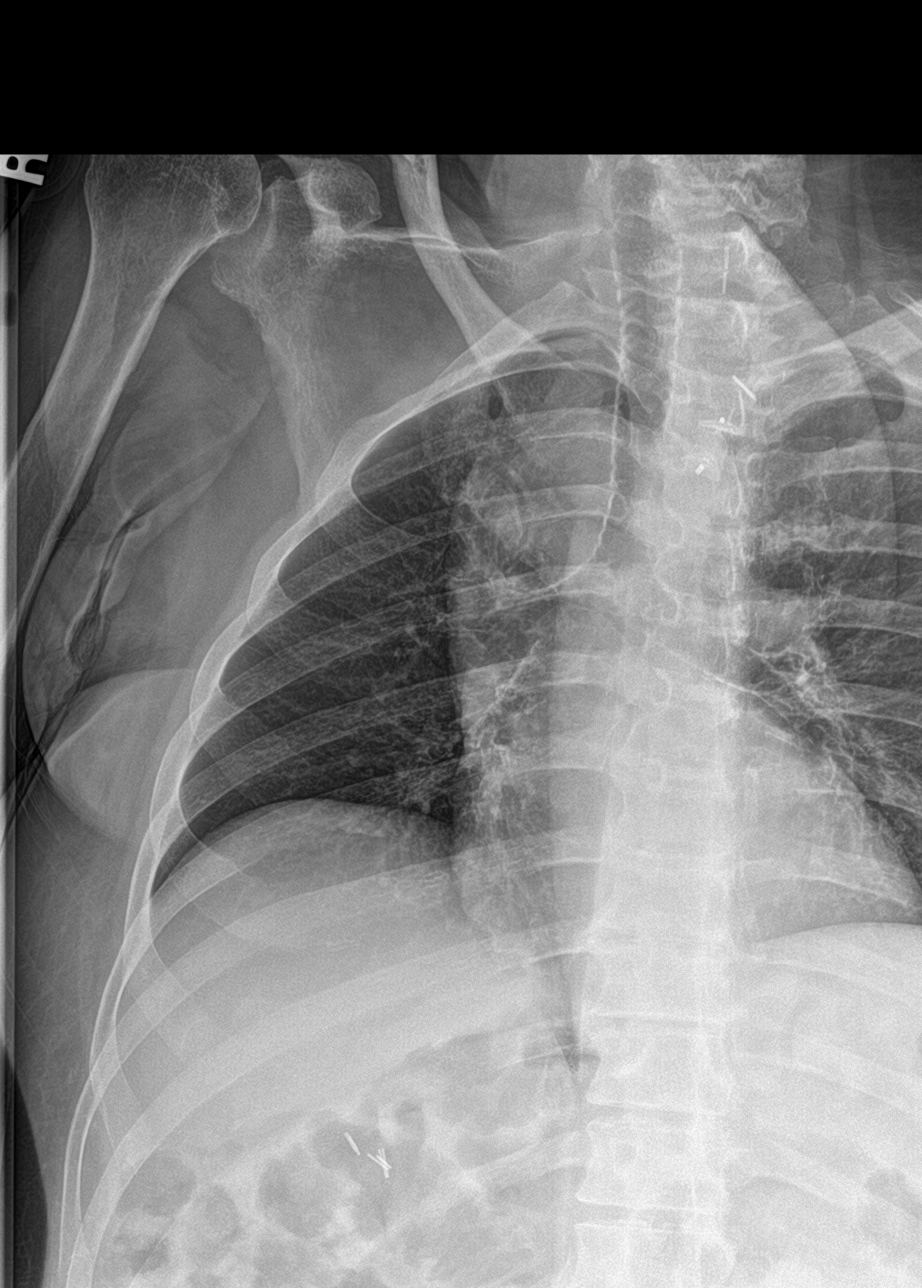
[im 3/4]
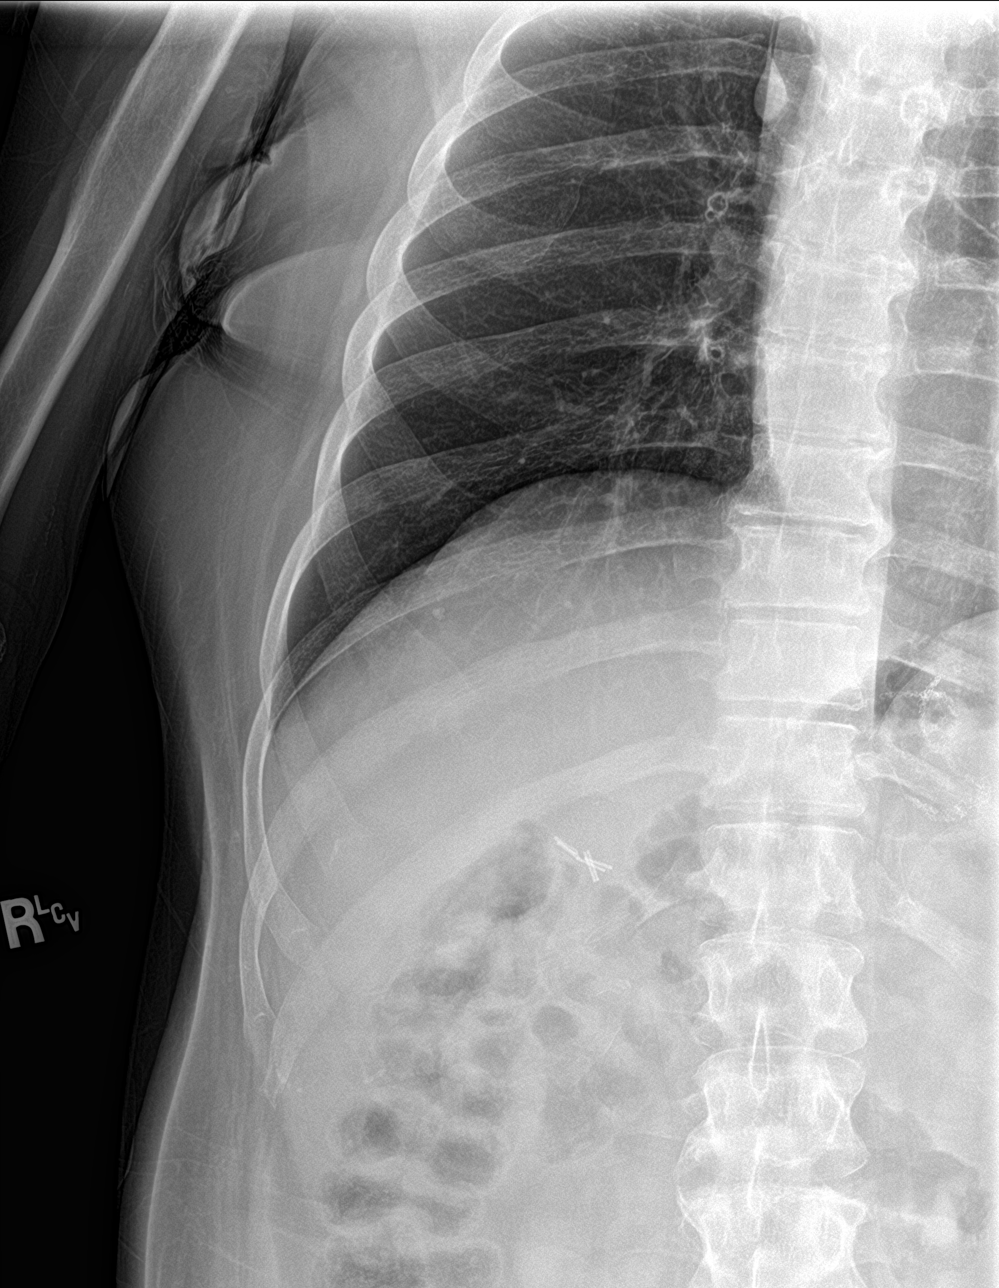
[im 4/4]
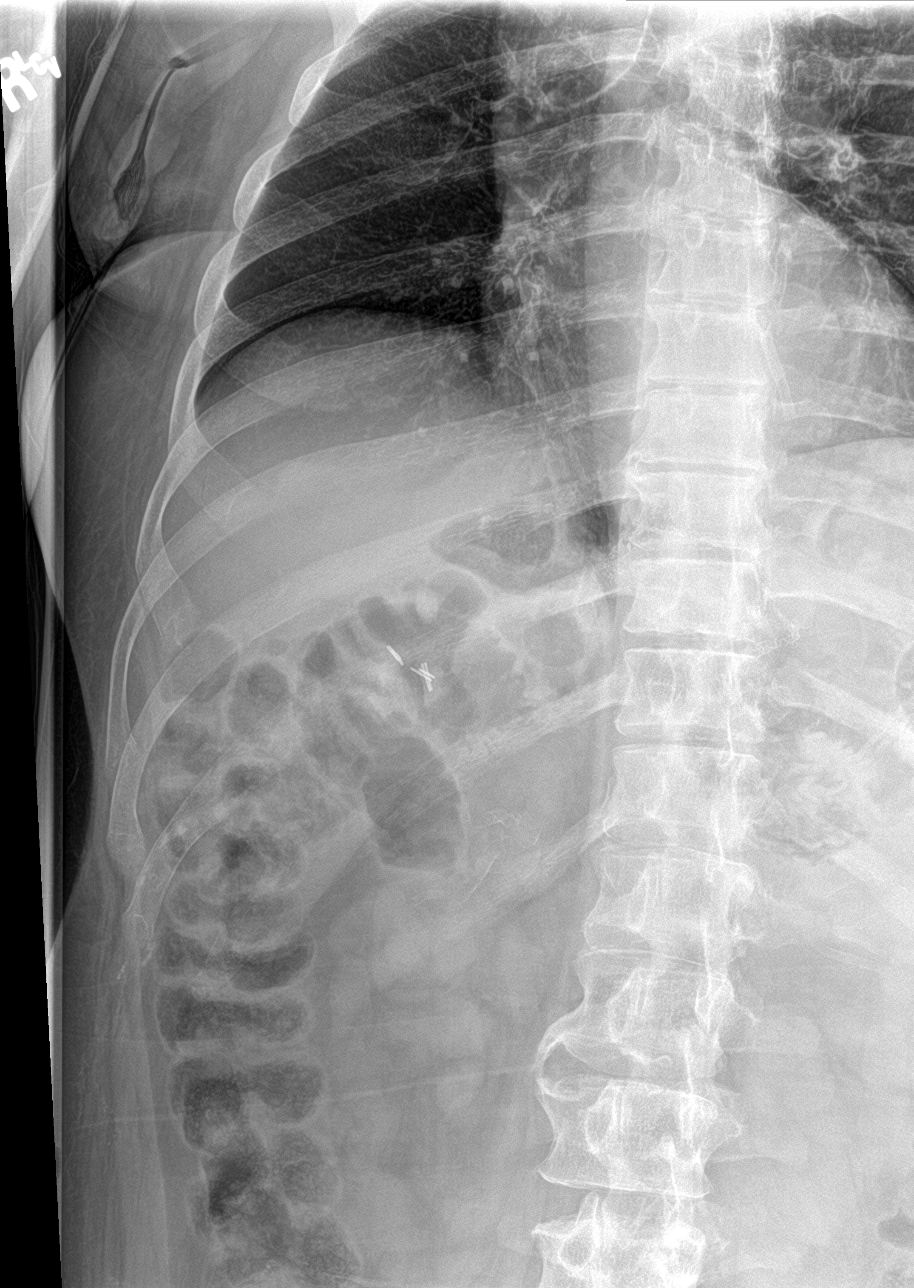

[4 of 4 positions shown; findings below may reference images not displayed]

FINDINGS: No displaced fracture or other bone lesions are seen involving the
ribs. Cholecystectomy clips. Surgical clips project over the left
thoracic inlet.
IMPRESSION: No displaced rib fracture visualized.

## 2022-03-14 IMAGING — CR DG CHEST 2V
1 series · 2 of 2 positions shown · non-contrast
Comparison: Chest radiograph December 12, 2019

CLINICAL DATA: Chest pain after fall down Mrozewski on [REDACTED].

EXAM:
CHEST - 2 VIEW

[Series 1: dg chest 2 view · 0.14mm/px · 2 of 2 slices shown]
[im 1/2]
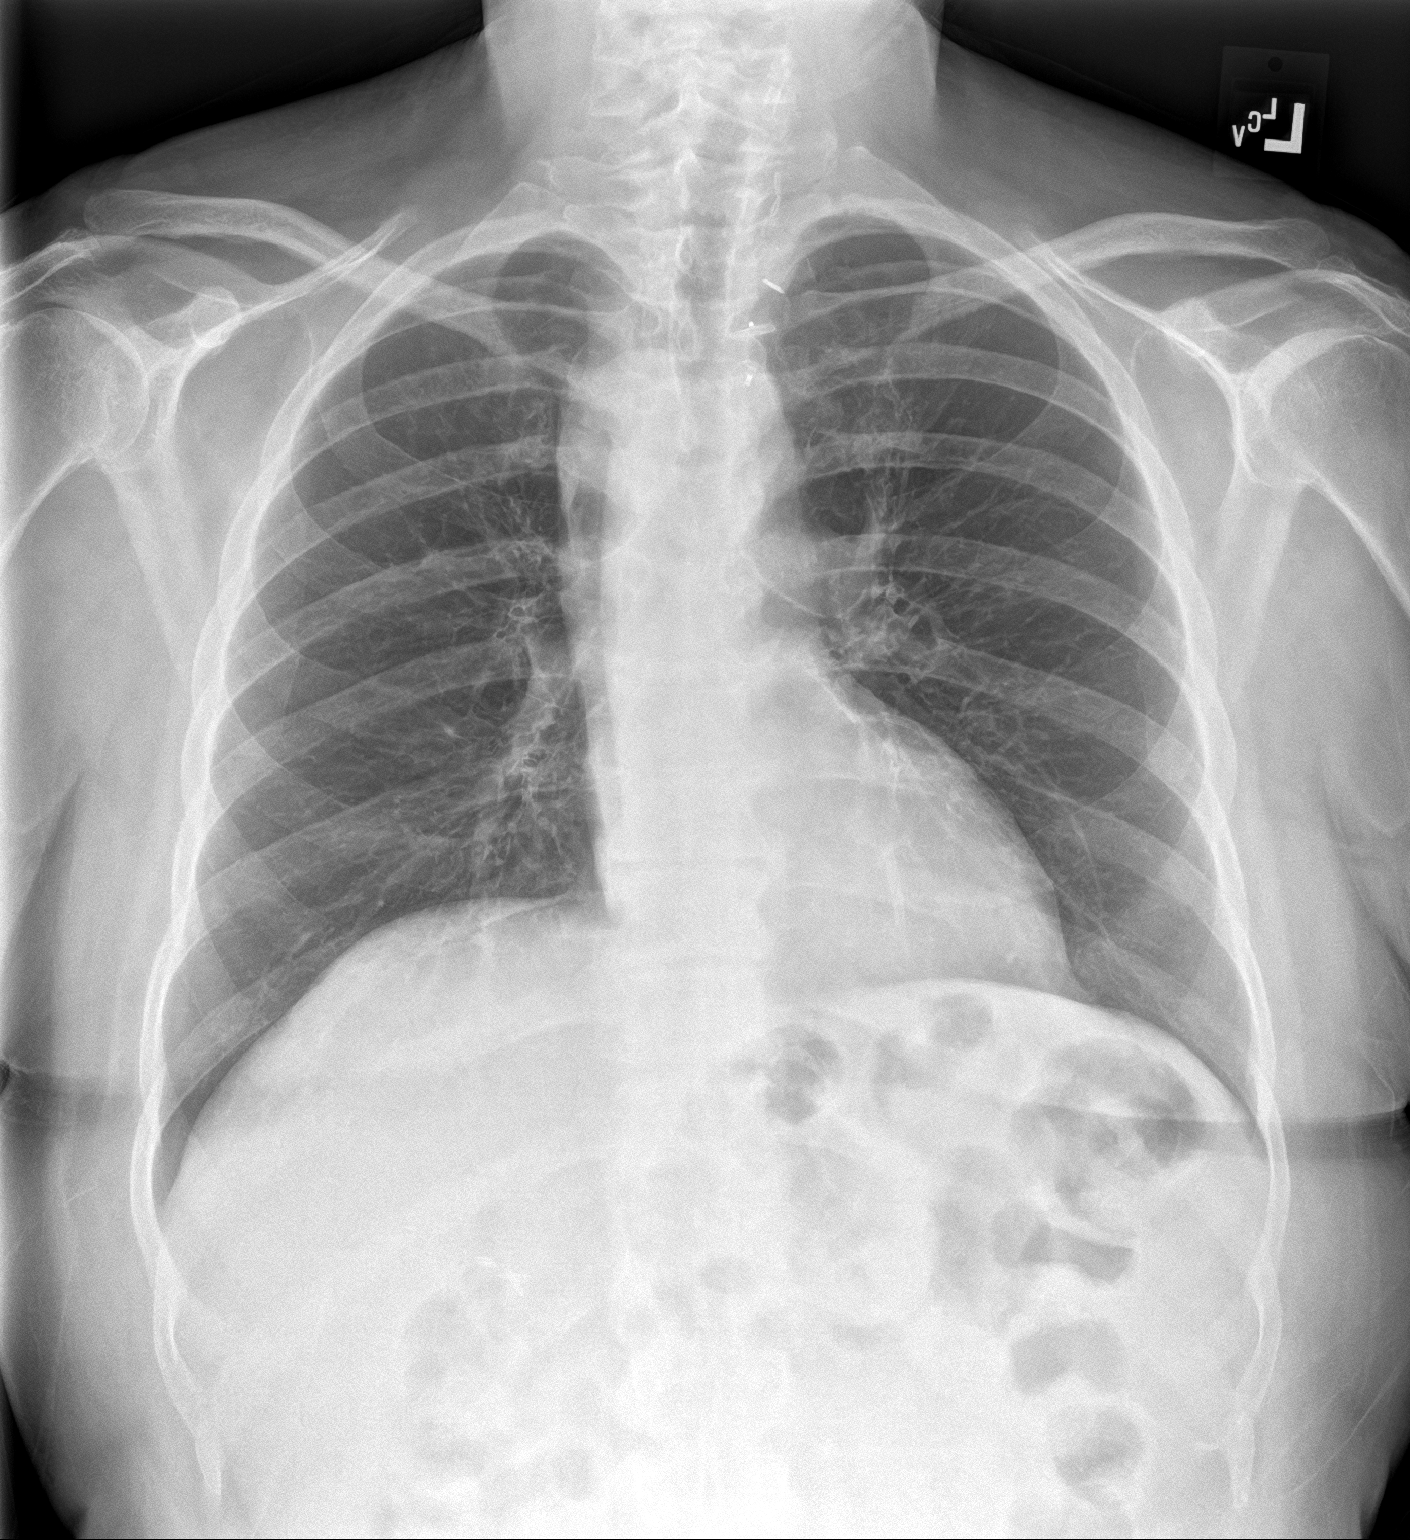
[im 2/2]
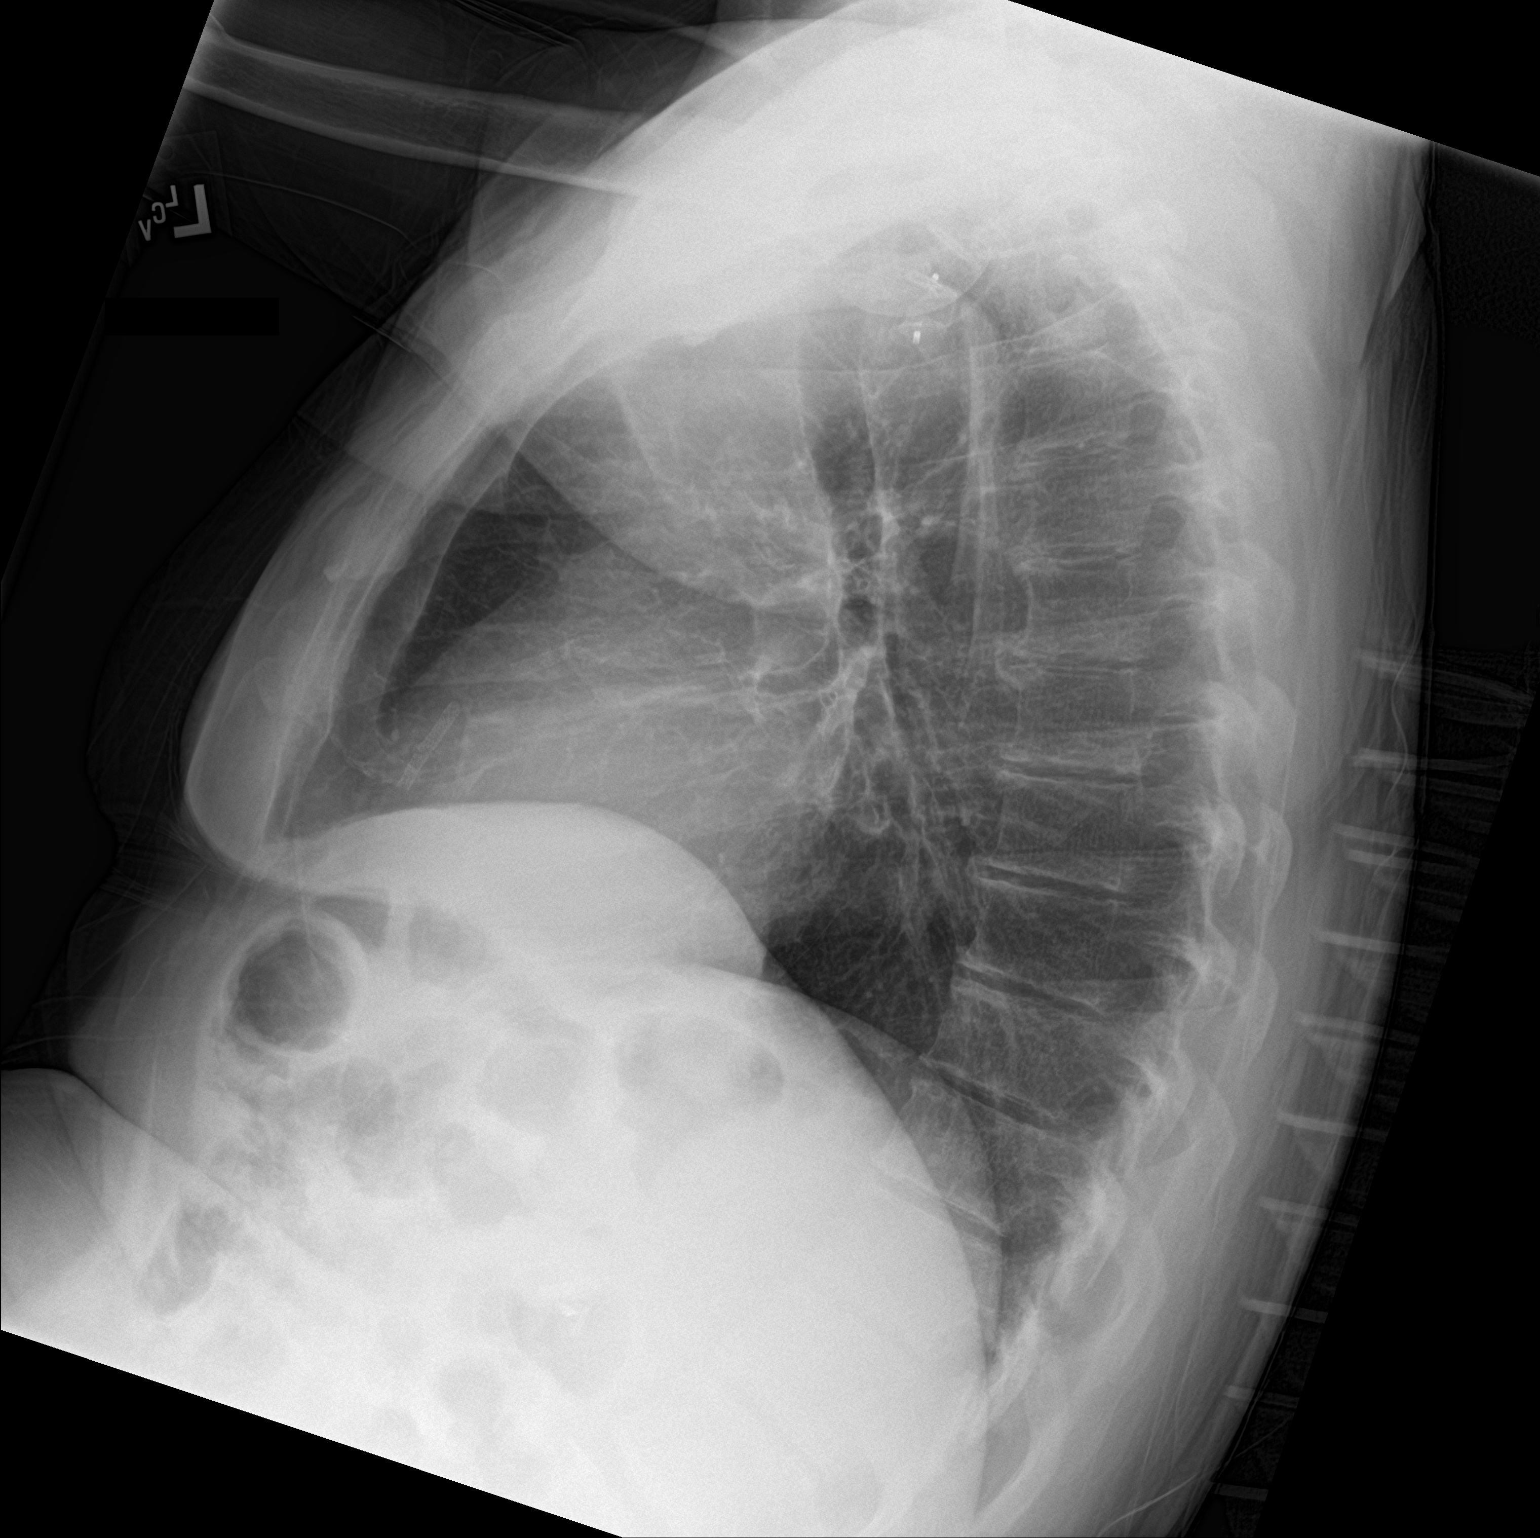

[2 of 2 positions shown; findings below may reference images not displayed]

FINDINGS: The heart size and mediastinal contours are unchanged. Right-sided
aortic arch on vascular ring better characterized on prior CT chest.
Surgical clips project in the region of the left thoracic inlet.
Both lungs are clear. The visualized skeletal structures are
unchanged.
IMPRESSION: No acute cardiopulmonary disease.

## 2022-05-23 IMAGING — CT CT MAXILLOFACIAL W/O CM
3 series · 15 of 47 positions shown, 18 images · non-contrast
Comparison: None.

CLINICAL DATA: Fall.  Facial trauma.  Right-sided facial swelling.

EXAM:
CT HEAD WITHOUT CONTRAST
CT MAXILLOFACIAL WITHOUT CONTRAST
TECHNIQUE: Multidetector CT imaging of the head and maxillofacial structures
were performed using the standard protocol without intravenous
contrast. Multiplanar CT image reconstructions of the maxillofacial
structures were also generated.

[Series 3: max soft · axial · 0.33mm/px · z∈[-219,-93]mm · 9 of 73 slices shown, 12 images]
[im 5/73  brain]
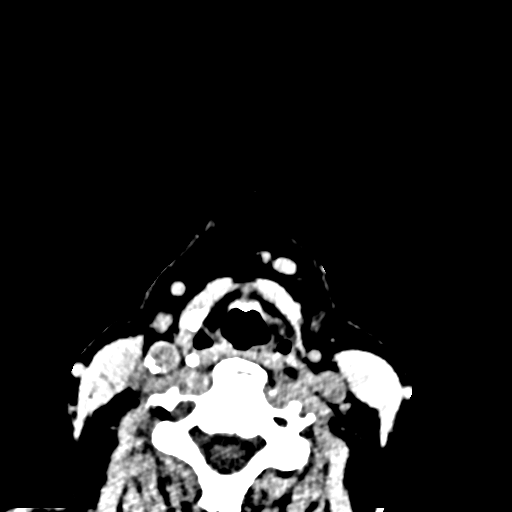
[im 5/73  bone]
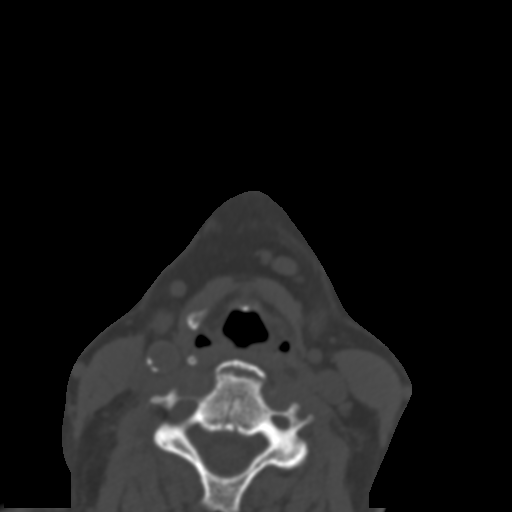
[im 13/73  bone]
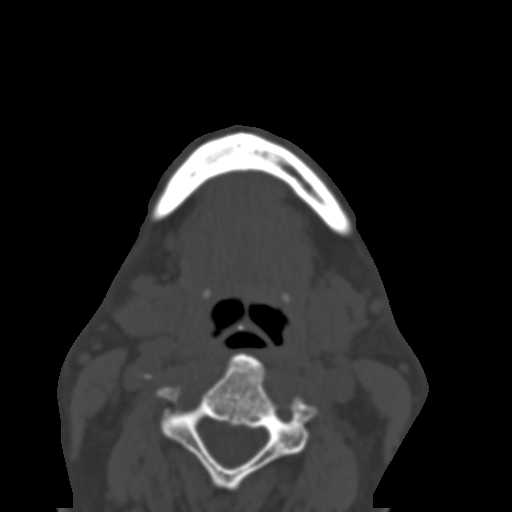
[im 20/73  bone]
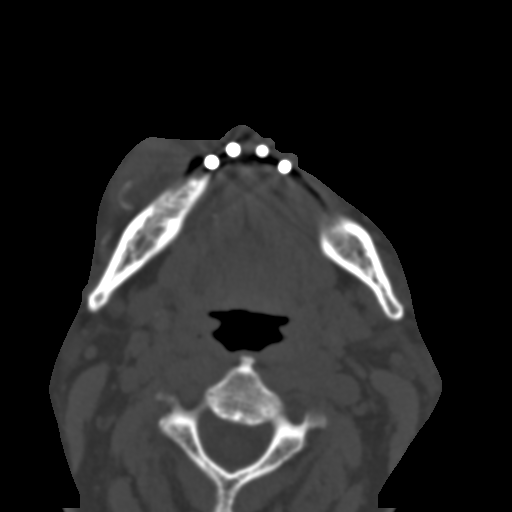
[im 28/73  bone]
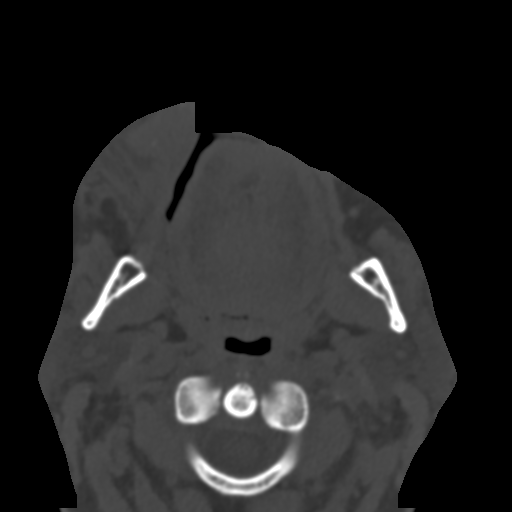
[im 38/73  brain]
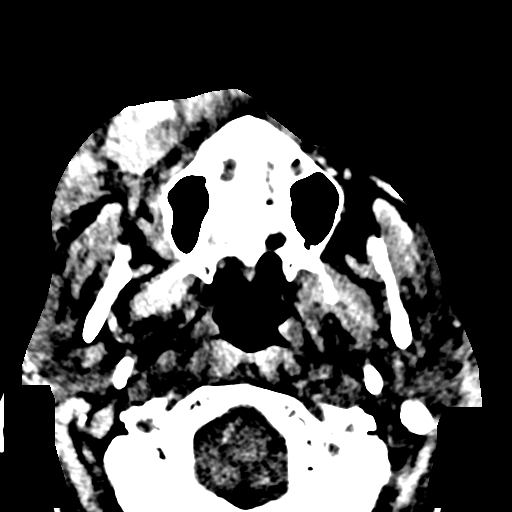
[im 38/73  bone]
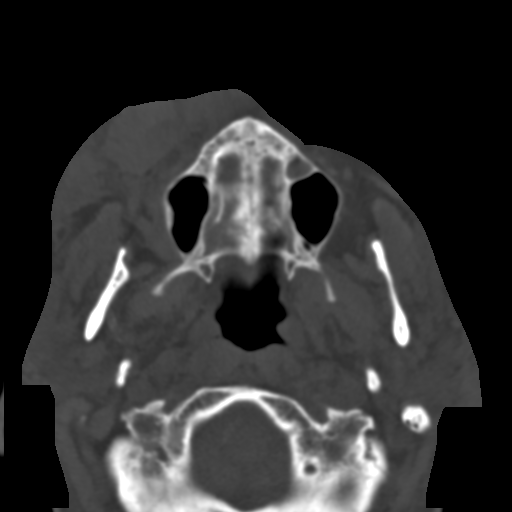
[im 45/73  bone]
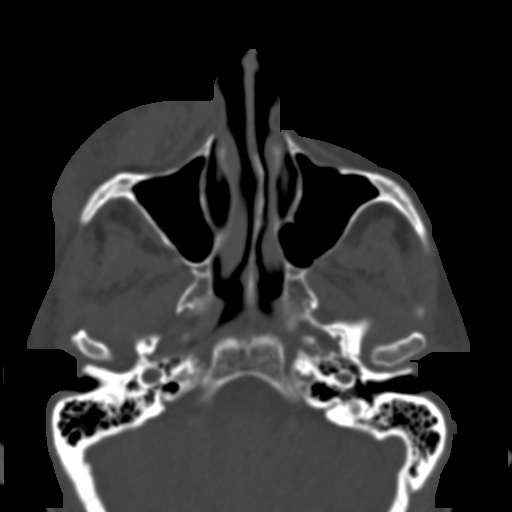
[im 53/73  bone]
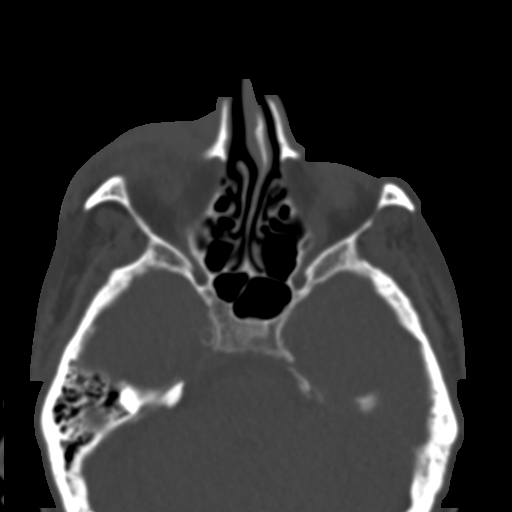
[im 60/73  bone]
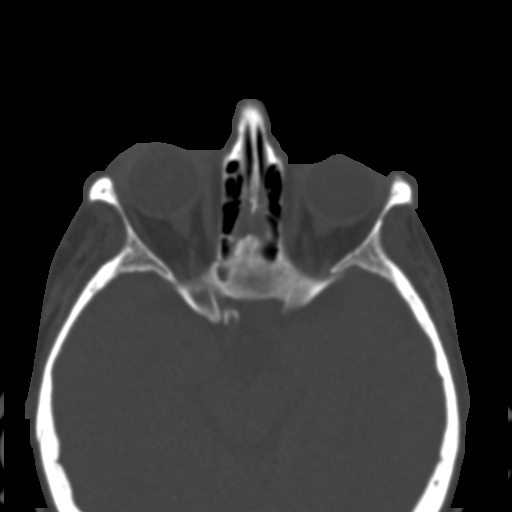
[im 68/73  brain]
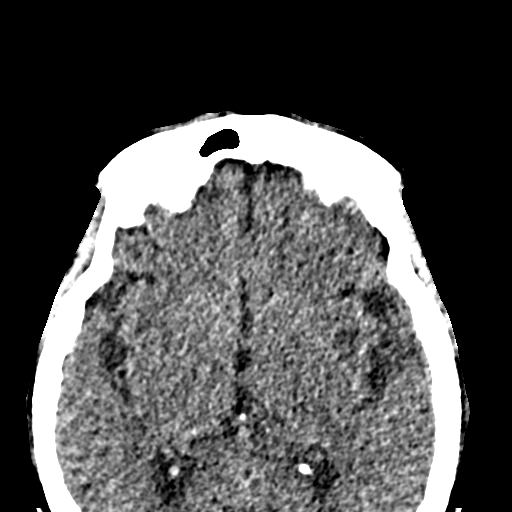
[im 68/73  bone]
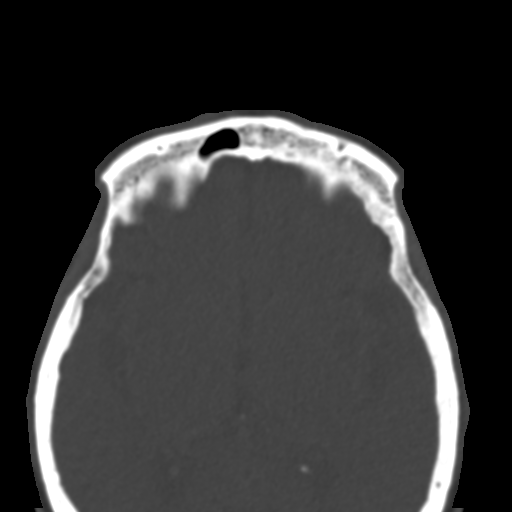

[Series 4: coronal soft · coronal · 0.29mm/px · 3 of 67 slices shown]
[im 23/67  bone]
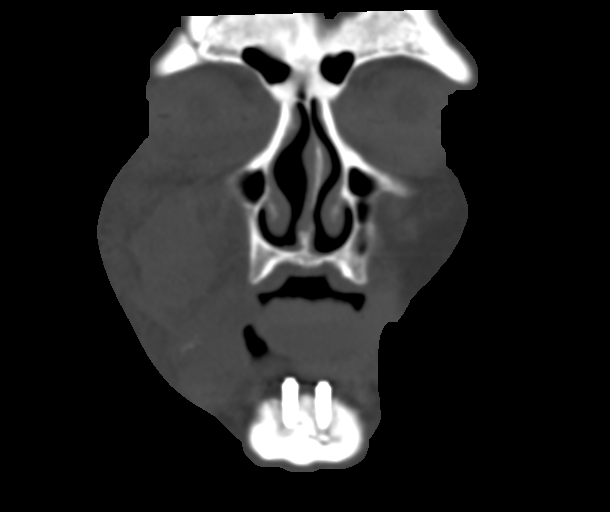
[im 30/67  bone]
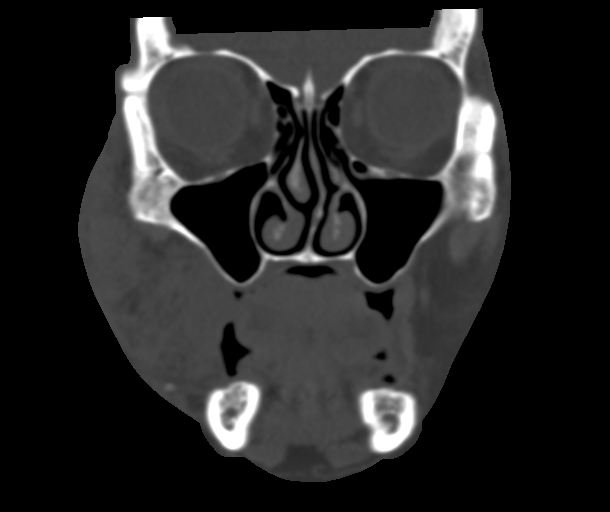
[im 37/67  bone]
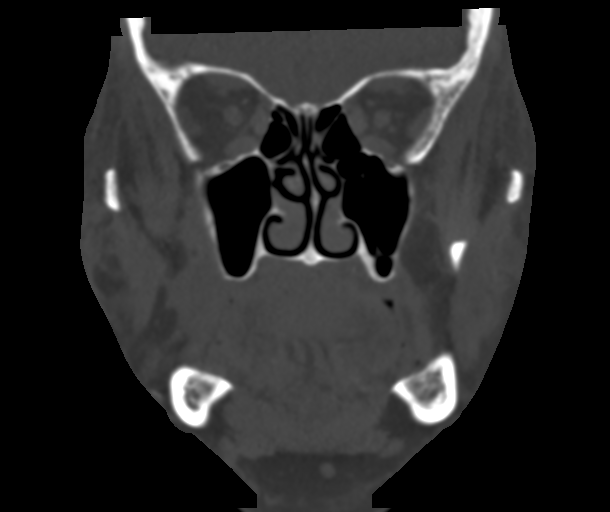

[Series 6: sagittal soft · sagittal · 0.26mm/px · 3 of 80 slices shown]
[im 27/80  bone]
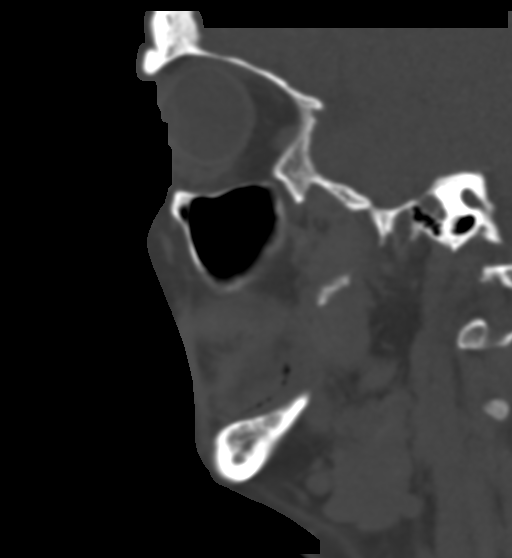
[im 40/80  bone]
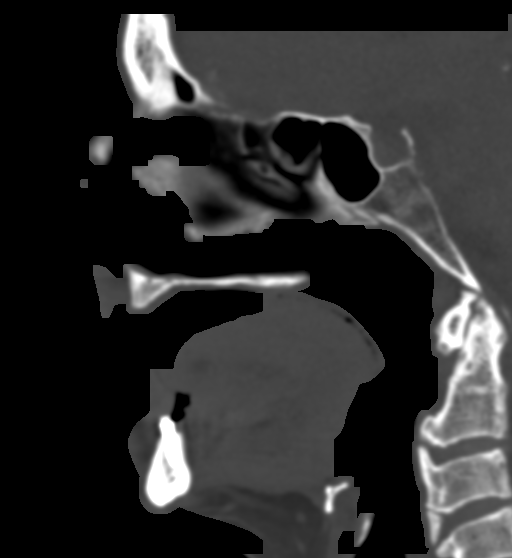
[im 53/80  bone]
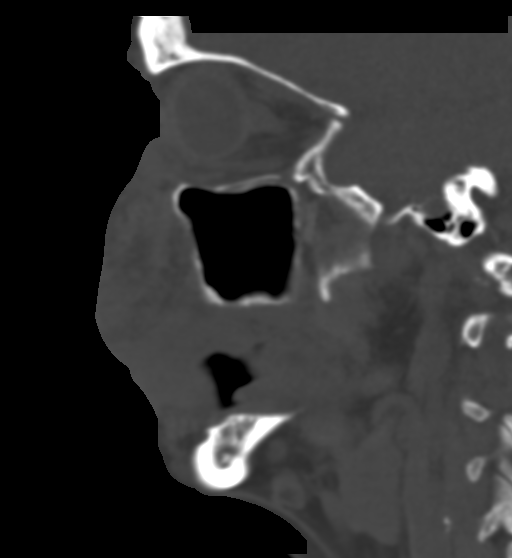

[15 of 47 positions shown; findings below may reference images not displayed]

FINDINGS: CT HEAD FINDINGS

Brain: Mild atrophy and white matter changes are stable. No acute
infarct, hemorrhage, or mass lesion is present. The ventricles are
of normal size. Remote lacunar infarcts noted within the genu of the
right internal capsule. Remote lacunar infarcts present in the
thalami bilaterally, stable. The brainstem and cerebellum are within
normal limits. No significant extraaxial fluid collection is
present.

Vascular: Atherosclerotic calcifications are present within the
cavernous internal carotid arteries. No hyperdense vessel is
present.

Skull: Calvarium is intact. No focal lytic or blastic lesions are
present.

CT MAXILLOFACIAL FINDINGS

Osseous: No acute fracture is present. Mandible is intact and
located. Dental implant anchors noted.

Orbits: Right periorbital hematoma present. Globes are intact.
Intraorbital injury present. Bilateral lens replacements noted.

Sinuses: Paranasal sinuses and mastoid air cells are clear.

Soft tissues: Right facial hematoma present with extensive soft
tissue swelling extending to the level of the mandible. Diffuse
periorbital swelling noted. No underlying fracture.
IMPRESSION: 1. Right facial hematoma with extensive soft tissue swelling
extending to the level of the mandible.
2. No underlying fracture.
3. Soft tissue swelling extends to the periorbital region. No
orbital injury or underlying fracture.
4. Stable atrophy and white matter disease.
5. Remote lacunar infarcts of the thalami bilaterally and genu of
the right internal capsule.
6. No acute intracranial abnormality or significant interval change.

## 2022-05-23 IMAGING — CT CT HEAD W/O CM
3 series · 15 of 47 positions shown, 18 images · non-contrast
Comparison: None.

CLINICAL DATA: Fall.  Facial trauma.  Right-sided facial swelling.

EXAM:
CT HEAD WITHOUT CONTRAST
CT MAXILLOFACIAL WITHOUT CONTRAST
TECHNIQUE: Multidetector CT imaging of the head and maxillofacial structures
were performed using the standard protocol without intravenous
contrast. Multiplanar CT image reconstructions of the maxillofacial
structures were also generated.

[Series 3: head wo · axial · 0.42mm/px · z∈[-132,-7]mm · 9 of 31 slices shown, 12 images]
[im 3/31  brain]
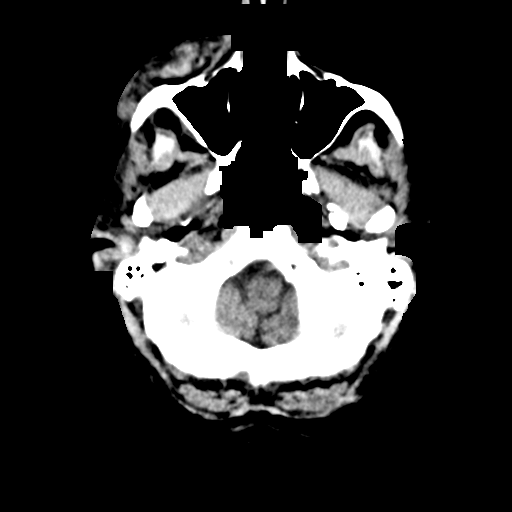
[im 3/31  bone]
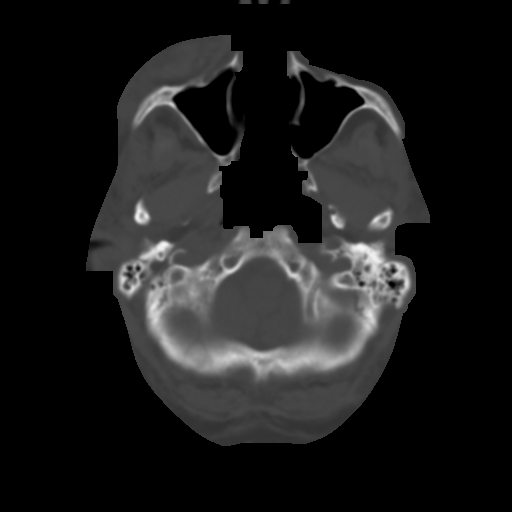
[im 6/31  brain]
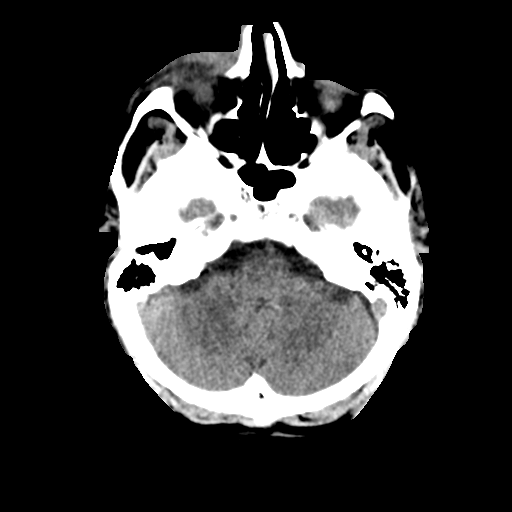
[im 9/31  brain]
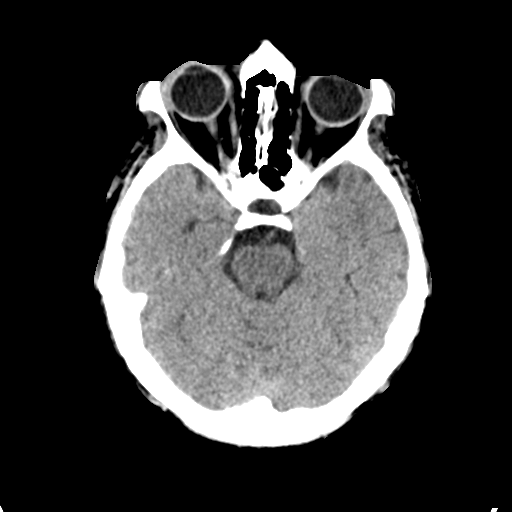
[im 12/31  brain]
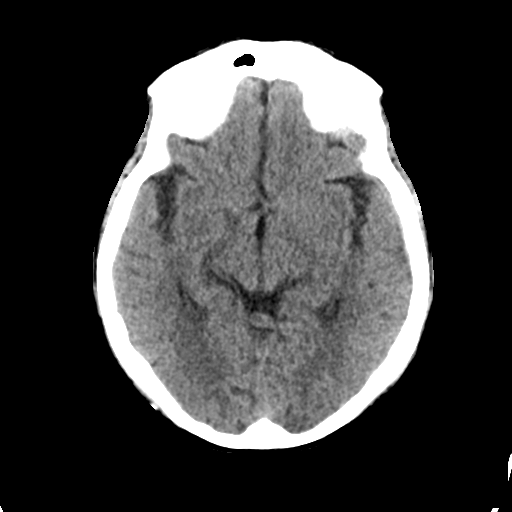
[im 16/31  brain]
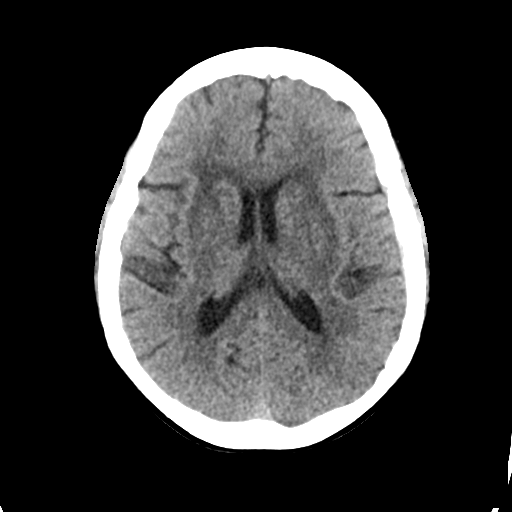
[im 16/31  bone]
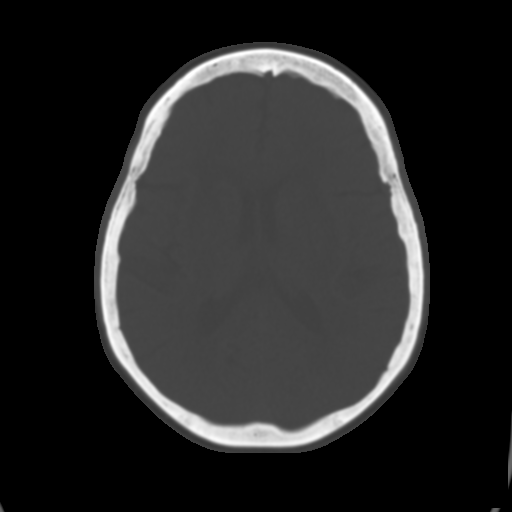
[im 19/31  brain]
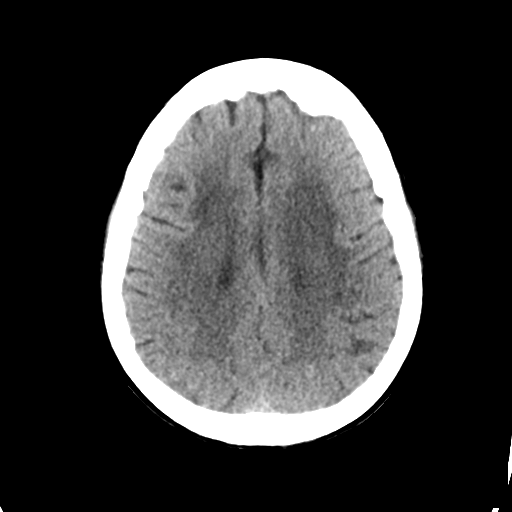
[im 22/31  brain]
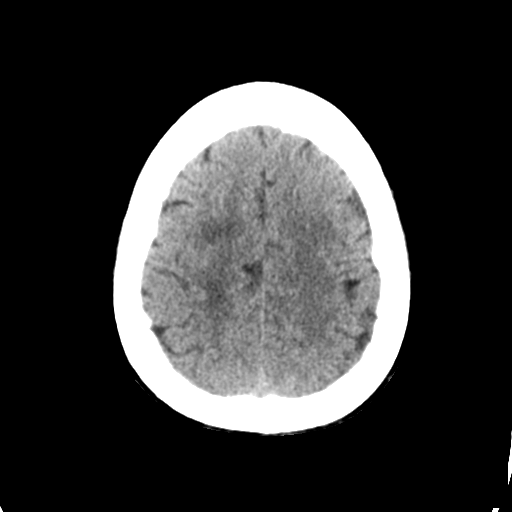
[im 25/31  brain]
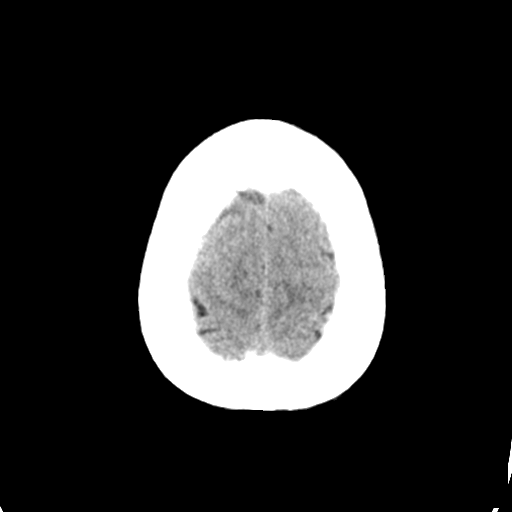
[im 28/31  brain]
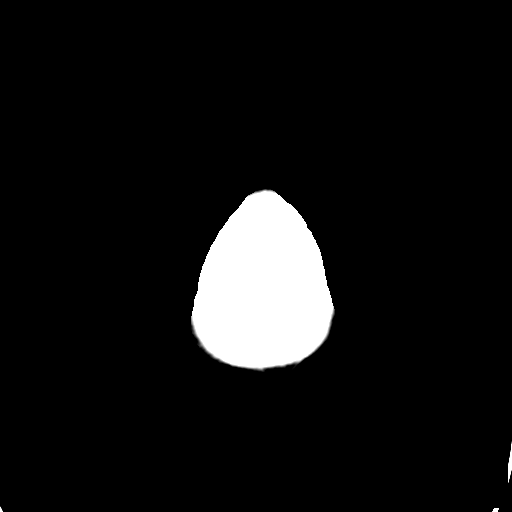
[im 28/31  bone]
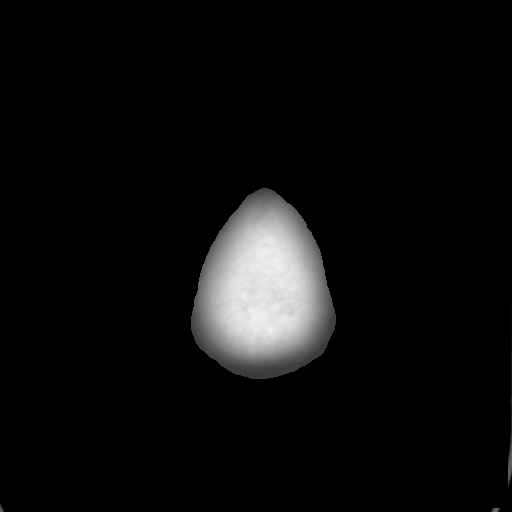

[Series 4: coronal soft tissue · coronal · 0.29mm/px · 3 of 67 slices shown]
[im 30/67  brain]
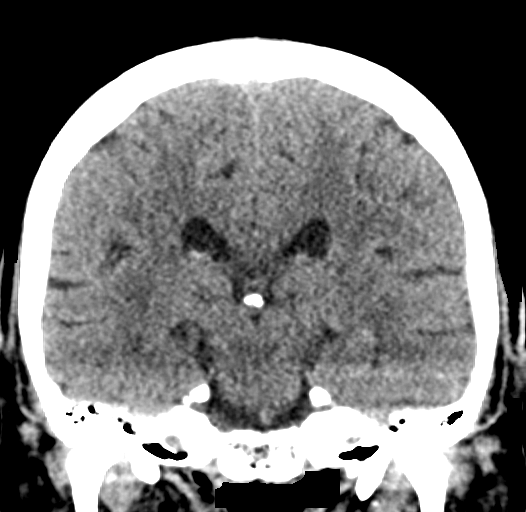
[im 37/67  brain]
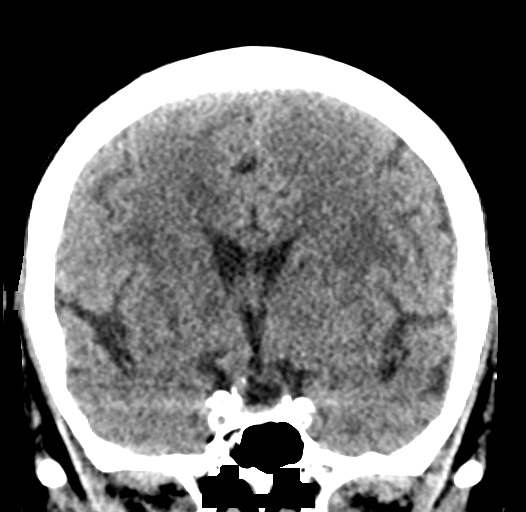
[im 43/67  brain]
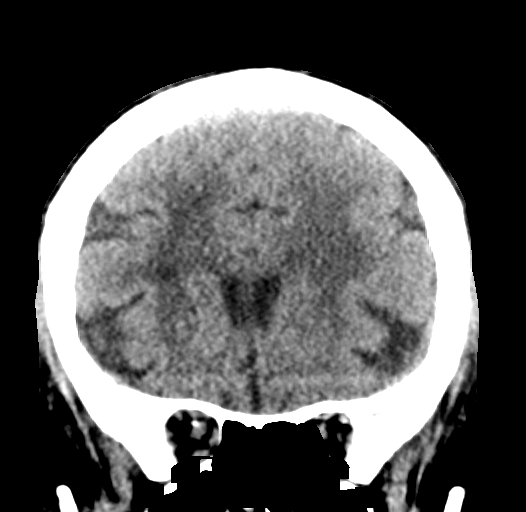

[Series 5: sagittal soft tissue · sagittal · 0.29mm/px · 3 of 52 slices shown]
[im 18/52  brain]
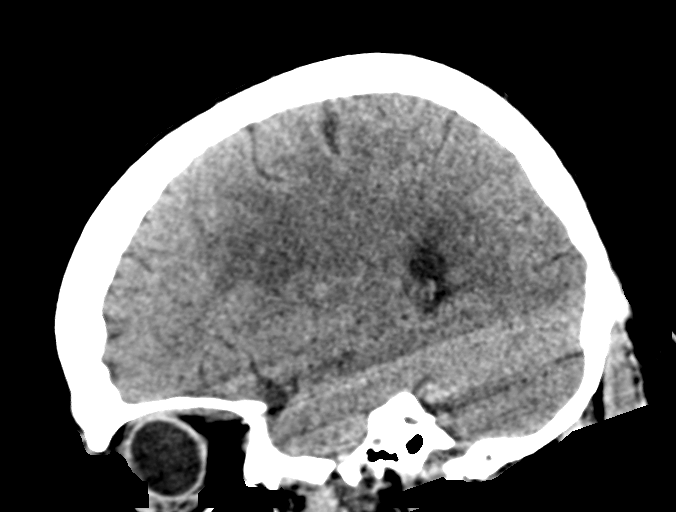
[im 26/52  brain]
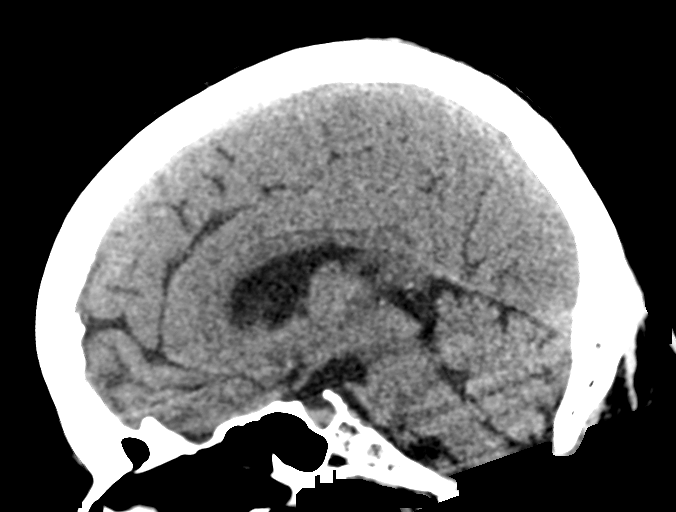
[im 35/52  brain]
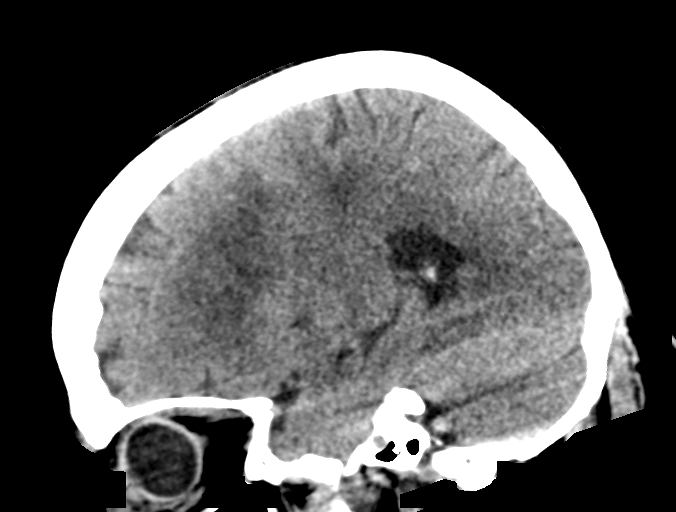

[15 of 47 positions shown; findings below may reference images not displayed]

FINDINGS: CT HEAD FINDINGS

Brain: Mild atrophy and white matter changes are stable. No acute
infarct, hemorrhage, or mass lesion is present. The ventricles are
of normal size. Remote lacunar infarcts noted within the genu of the
right internal capsule. Remote lacunar infarcts present in the
thalami bilaterally, stable. The brainstem and cerebellum are within
normal limits. No significant extraaxial fluid collection is
present.

Vascular: Atherosclerotic calcifications are present within the
cavernous internal carotid arteries. No hyperdense vessel is
present.

Skull: Calvarium is intact. No focal lytic or blastic lesions are
present.

CT MAXILLOFACIAL FINDINGS

Osseous: No acute fracture is present. Mandible is intact and
located. Dental implant anchors noted.

Orbits: Right periorbital hematoma present. Globes are intact.
Intraorbital injury present. Bilateral lens replacements noted.

Sinuses: Paranasal sinuses and mastoid air cells are clear.

Soft tissues: Right facial hematoma present with extensive soft
tissue swelling extending to the level of the mandible. Diffuse
periorbital swelling noted. No underlying fracture.
IMPRESSION: 1. Right facial hematoma with extensive soft tissue swelling
extending to the level of the mandible.
2. No underlying fracture.
3. Soft tissue swelling extends to the periorbital region. No
orbital injury or underlying fracture.
4. Stable atrophy and white matter disease.
5. Remote lacunar infarcts of the thalami bilaterally and genu of
the right internal capsule.
6. No acute intracranial abnormality or significant interval change.

## 2022-05-23 IMAGING — DX DG WRIST COMPLETE 3+V*R*
4 series · 4 of 4 positions shown · non-contrast
Comparison: Radiograph 08/07/2019.

CLINICAL DATA: Wrist pain post fall.

EXAM:
RIGHT WRIST - COMPLETE 3+ VIEW

[wrist ap (1 of 2)]
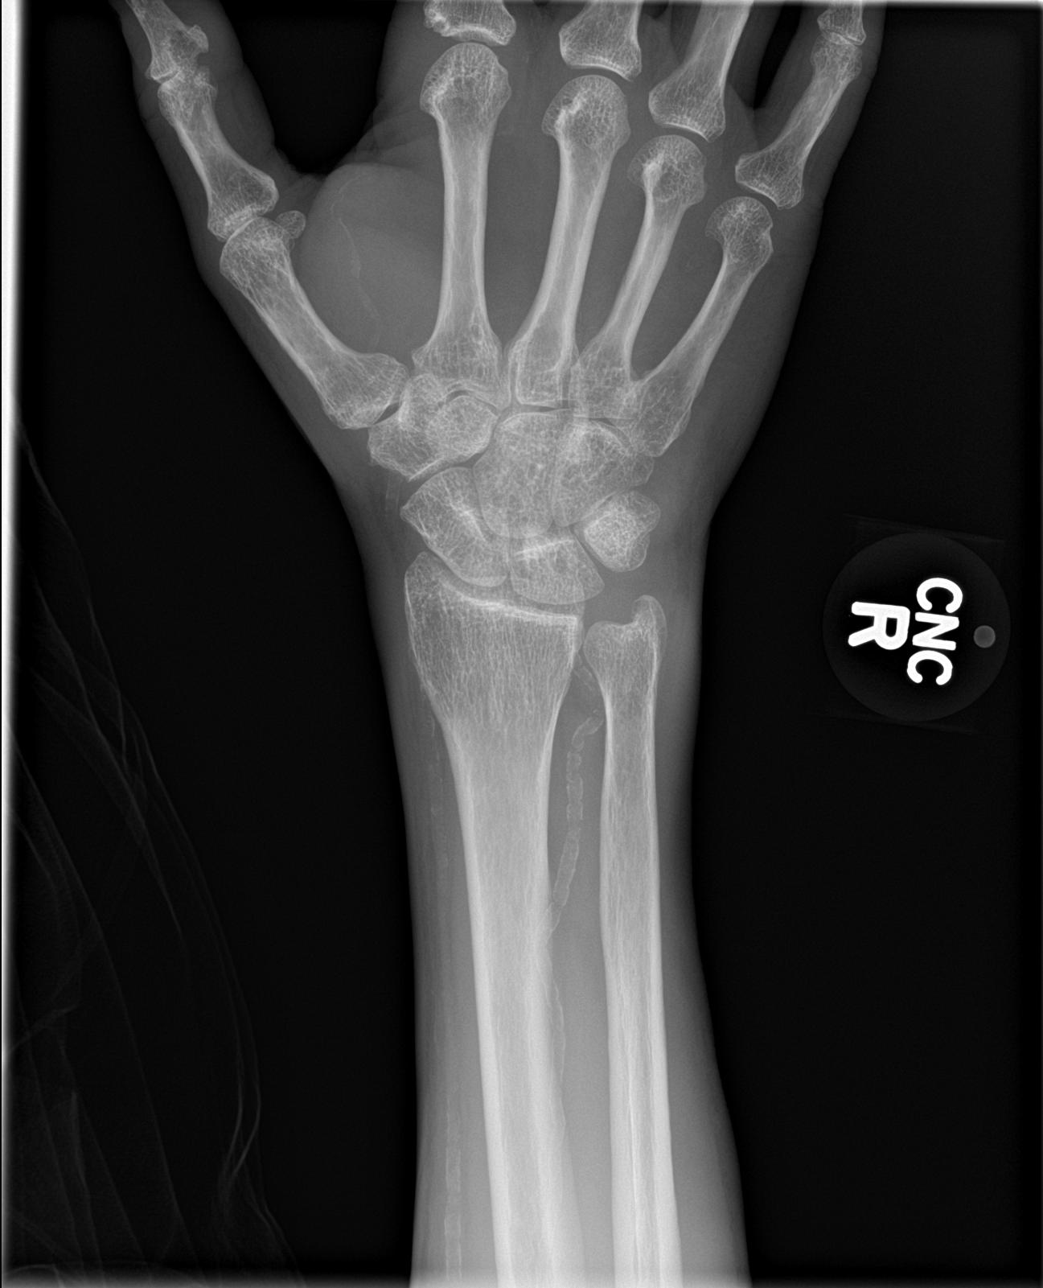

[wrist obl]
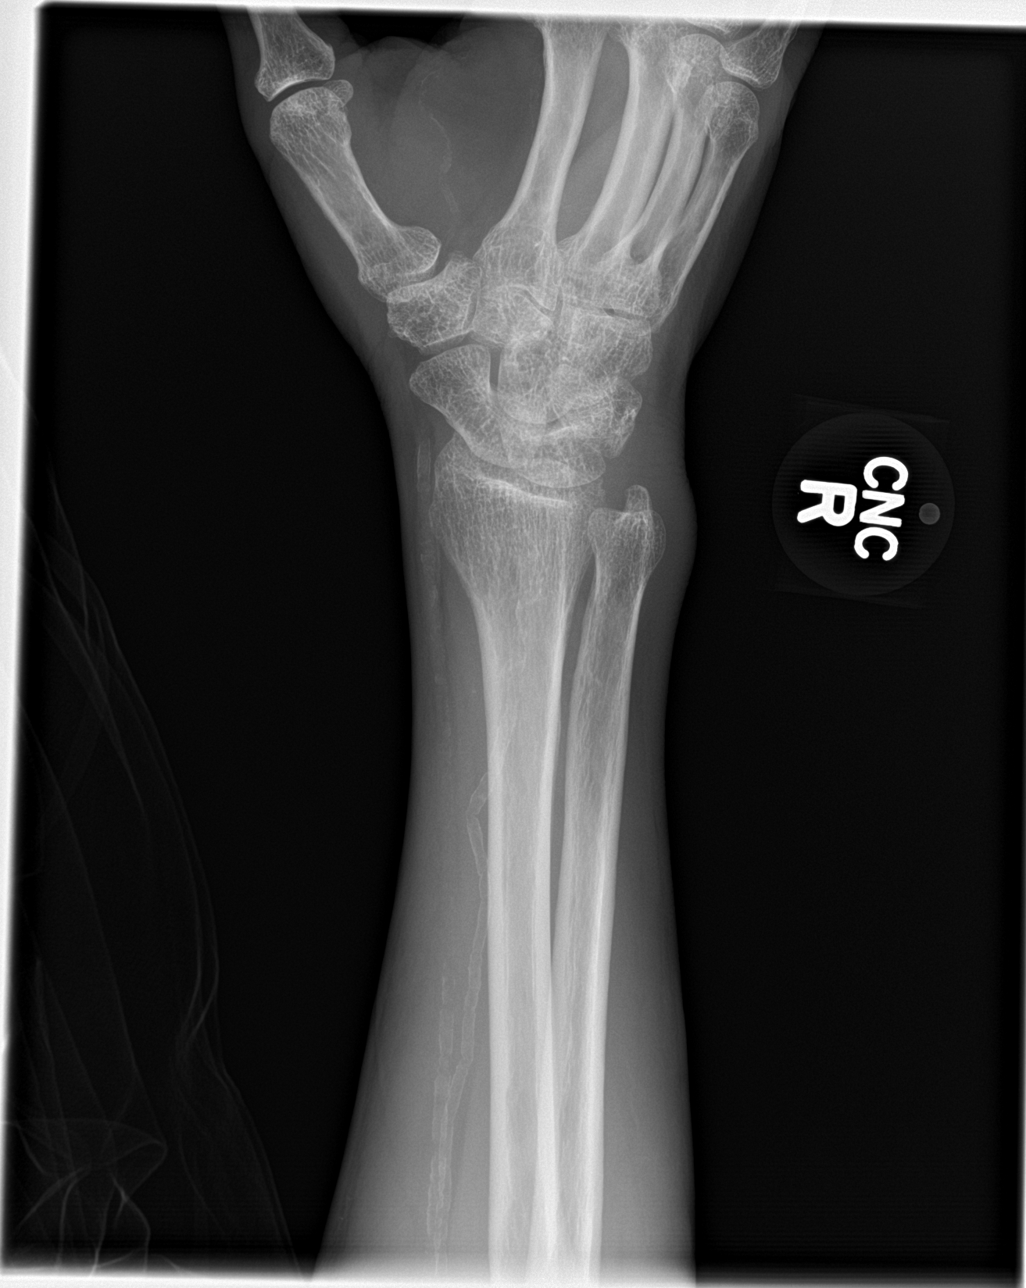

[wrist lat]
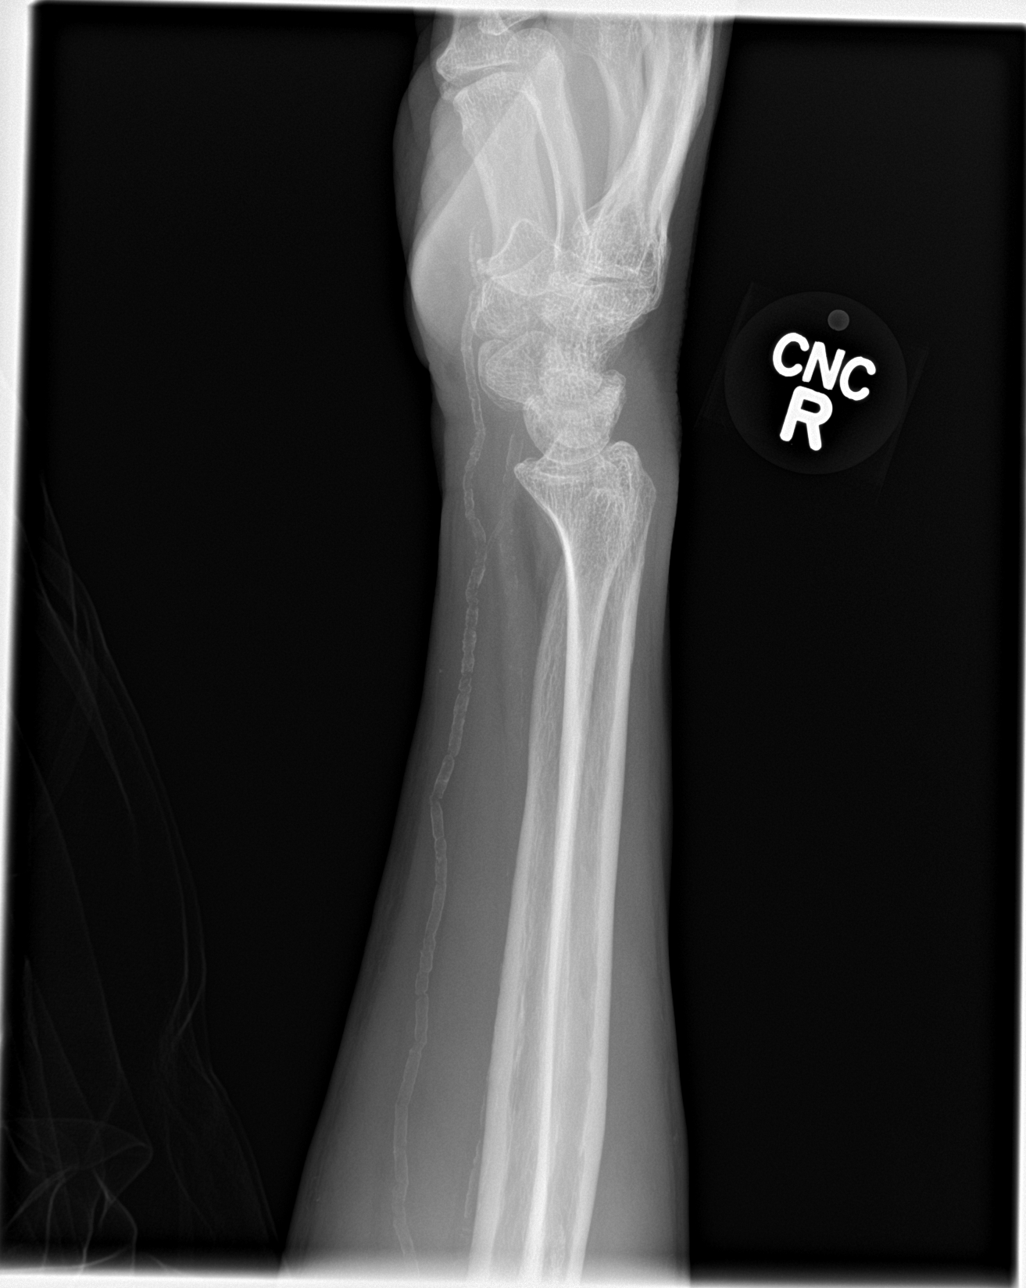

[wrist ap (2 of 2)]
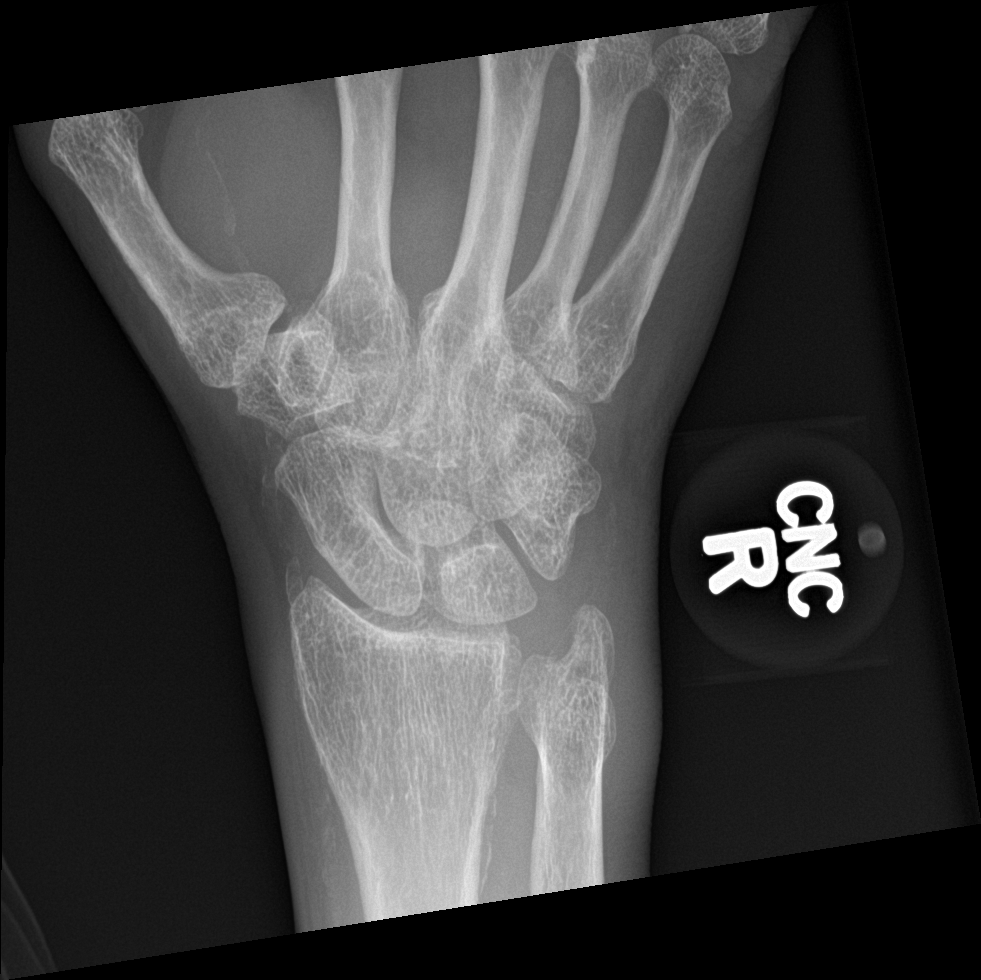

[4 of 4 positions shown; findings below may reference images not displayed]

FINDINGS: The bones appear adequately mineralized. There is no evidence of
acute fracture or dislocation. Scattered mild degenerative changes
are noted. There are prominent vascular calcifications.
IMPRESSION: No acute osseous findings.

## 2022-05-31 IMAGING — CR DG ELBOW COMPLETE 3+V*R*
1 series · 4 of 4 positions shown · non-contrast
Comparison: None.

CLINICAL DATA: Fall on mother's day with persistent elbow pain.

EXAM:
RIGHT ELBOW - COMPLETE 3+ VIEW

[Series 1: x elbow ap · 0.14mm/px · 4 of 4 slices shown]
[im 1/4]
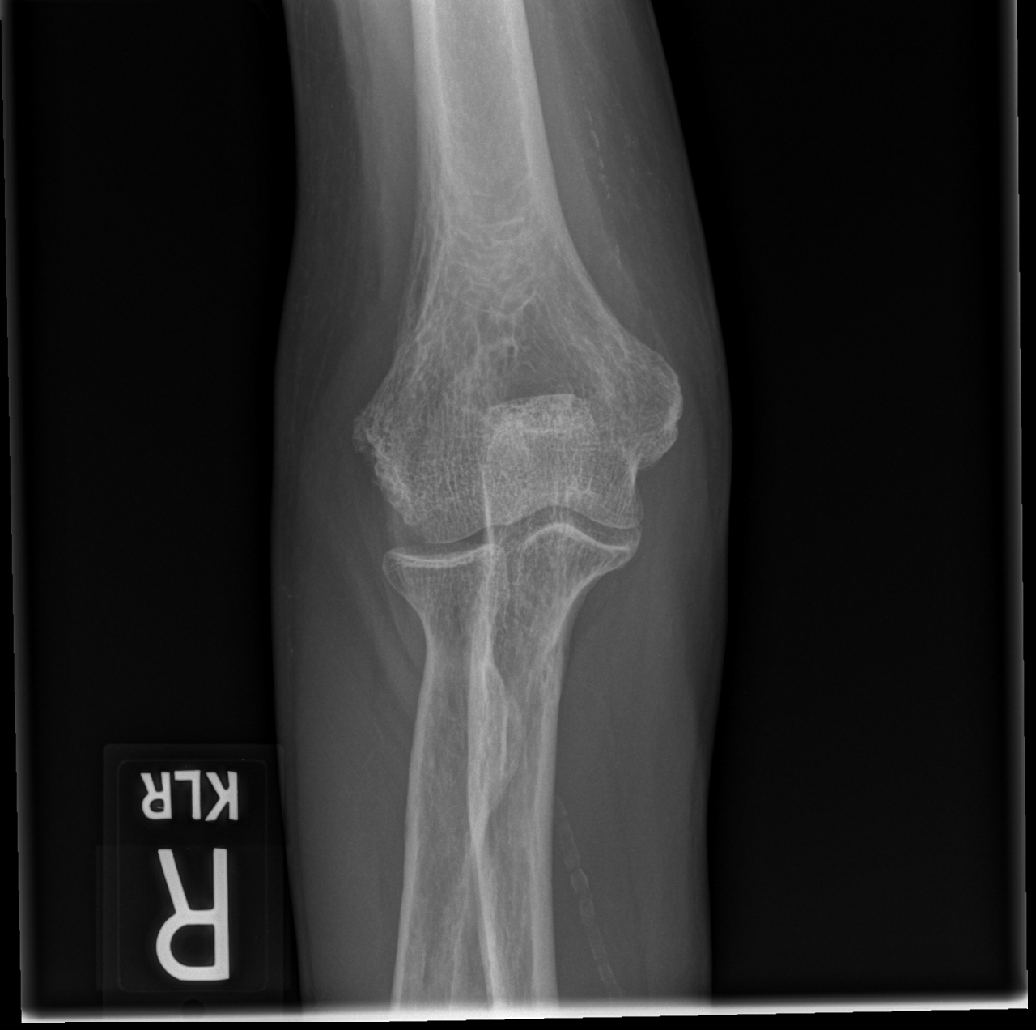
[im 2/4]
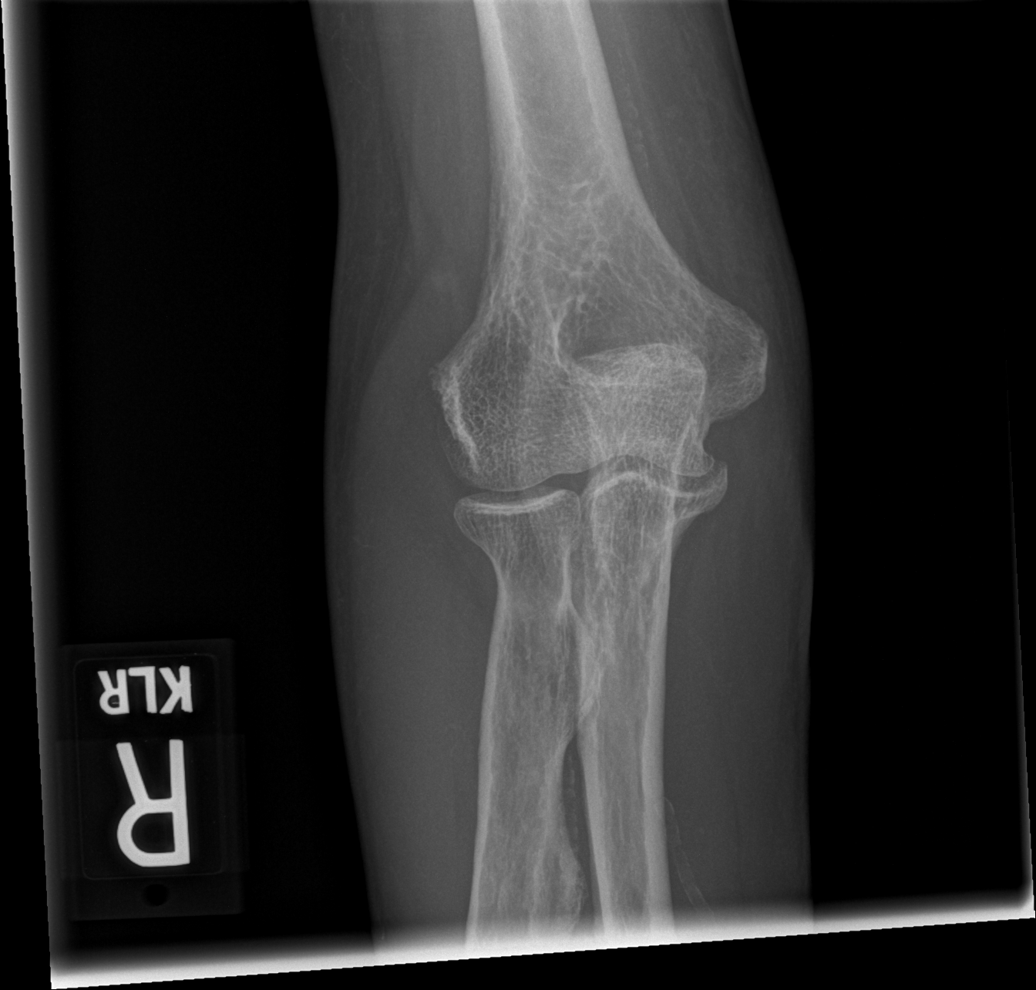
[im 3/4]
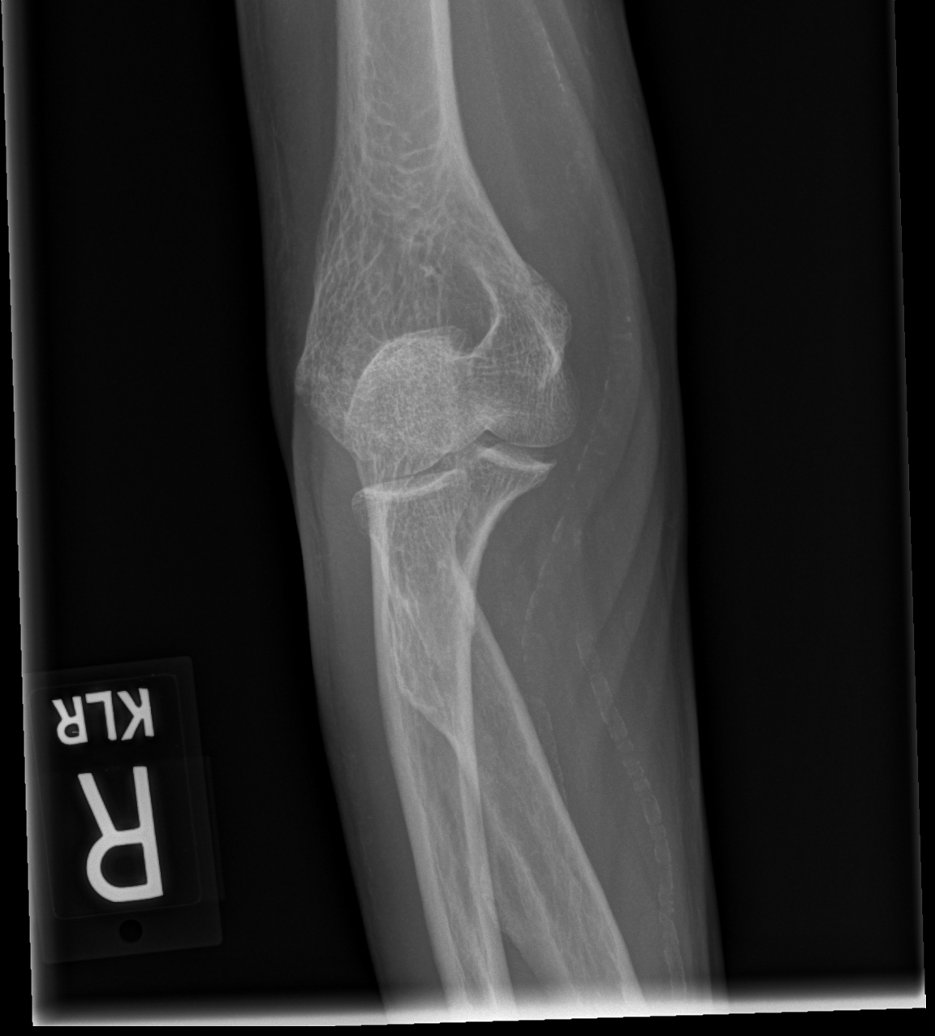
[im 4/4]
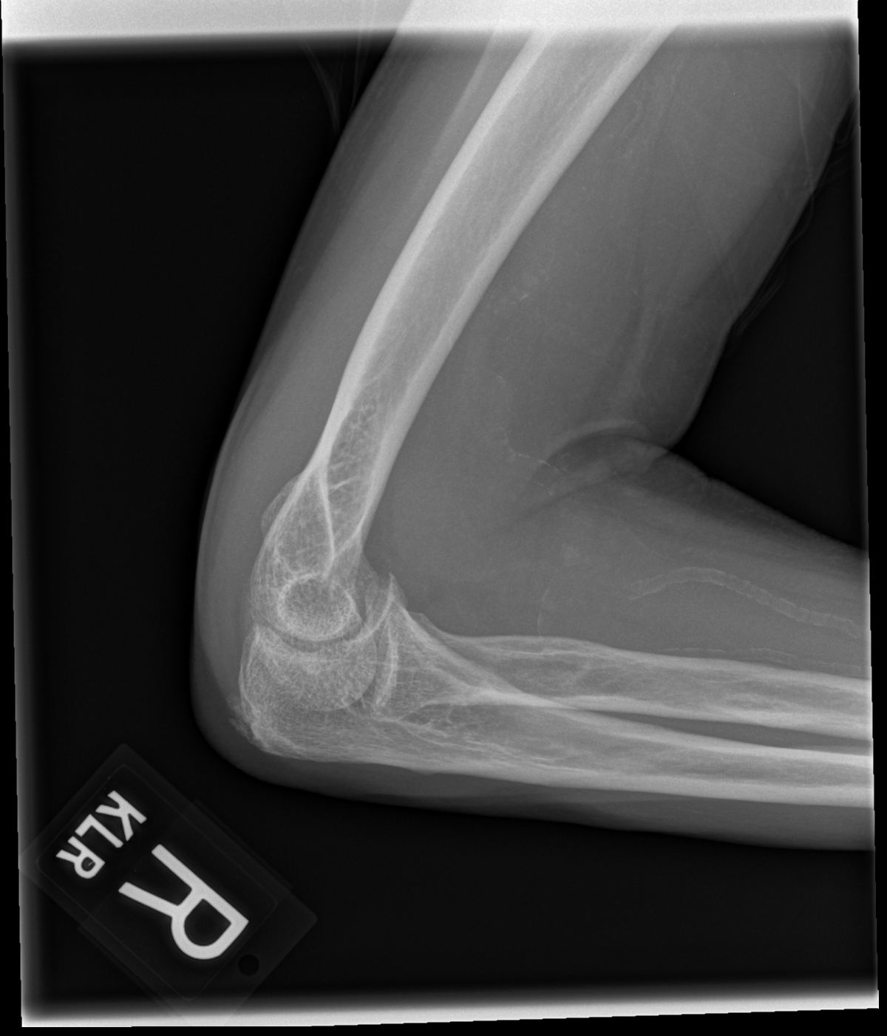

[4 of 4 positions shown; findings below may reference images not displayed]

FINDINGS: Suspected mild soft tissue swelling about the posterior aspect of
the elbow. No associated fracture or elbow joint effusion.
Enthesopathic change involving the triceps tendon insertion site.
Joint spaces appear preserved. Age advanced vascular calcifications.
IMPRESSION: 1. Suspected soft tissue swelling about the posterior aspect of the
elbow without associated fracture or dislocation.
2. Enthesopathic change involving the triceps tendon insertion site.
3. Age advanced vascular calcifications, nonspecific though
typically seen in the setting of diabetes.

## 2022-05-31 IMAGING — CT CT HEAD W/O CM
3 series · 14 of 45 positions shown, 16 images · non-contrast
Comparison: 06/09/2020

CLINICAL DATA: Headache.  Rule out intracranial hemorrhage.

EXAM:
CT HEAD WITHOUT CONTRAST
TECHNIQUE: Contiguous axial images were obtained from the base of the skull
through the vertex without intravenous contrast.

[Series 2: head wo · axial · 0.44mm/px · z∈[-146,-31]mm · 8 of 28 slices shown, 10 images]
[im 3/28  brain]
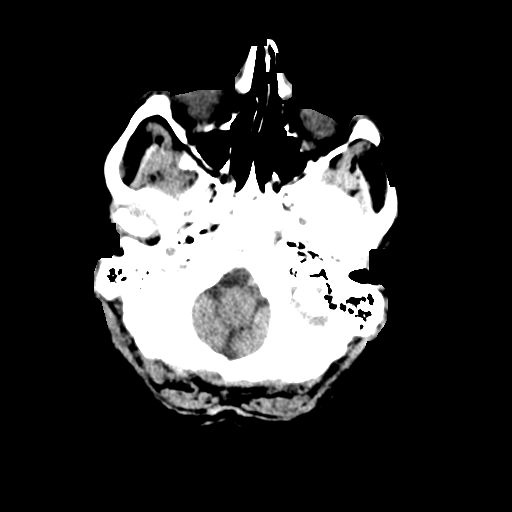
[im 3/28  bone]
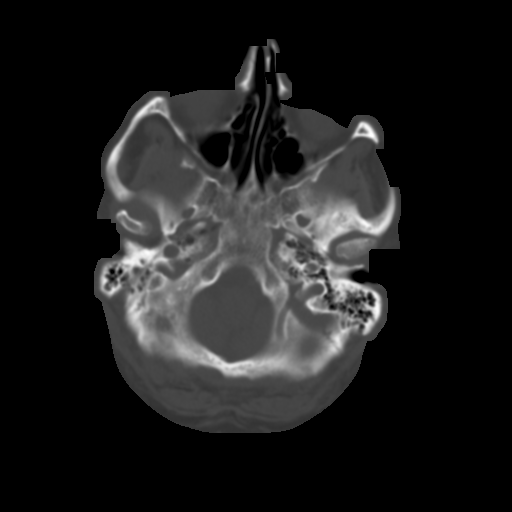
[im 6/28  brain]
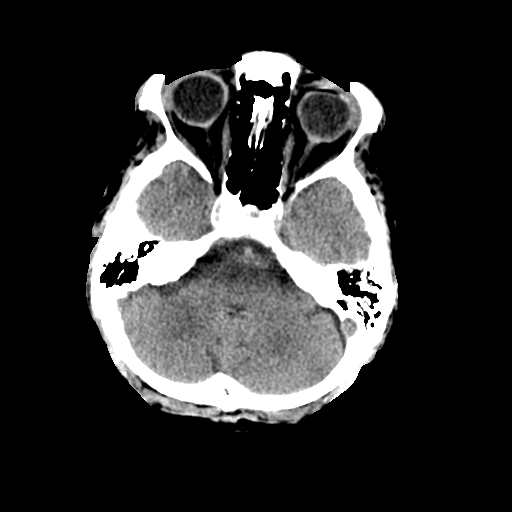
[im 10/28  brain]
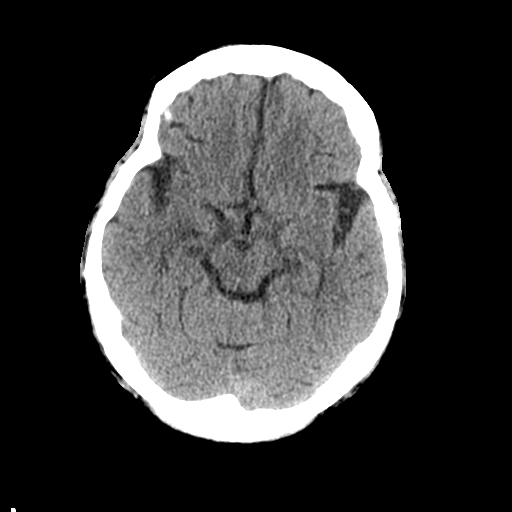
[im 13/28  brain]
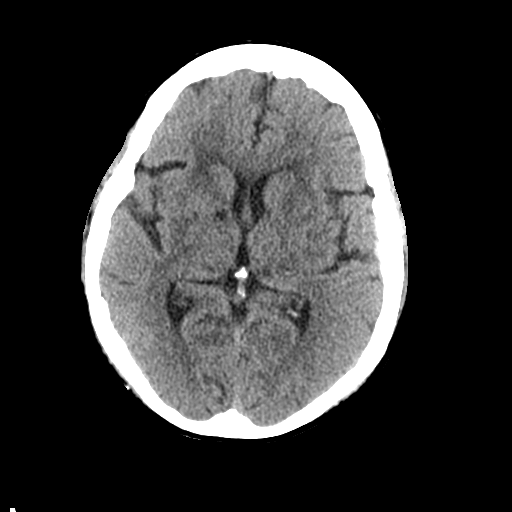
[im 16/28  brain]
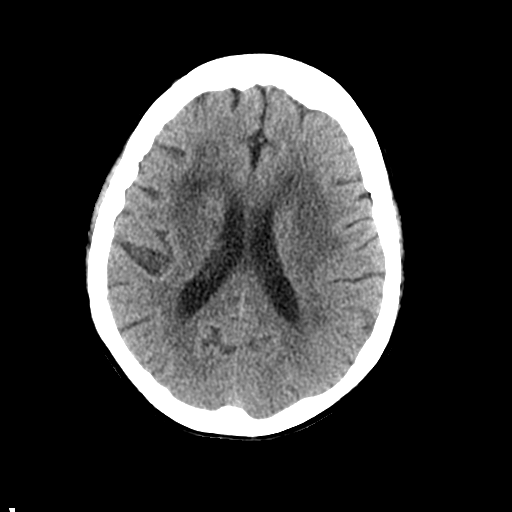
[im 16/28  bone]
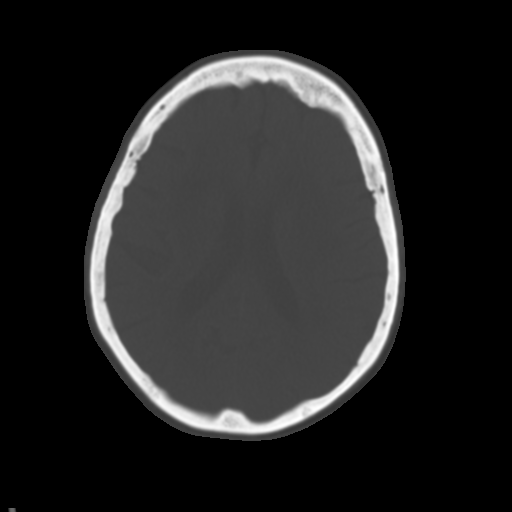
[im 19/28  brain]
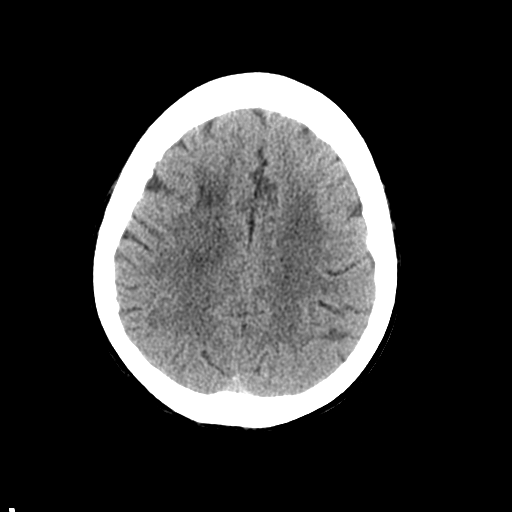
[im 23/28  brain]
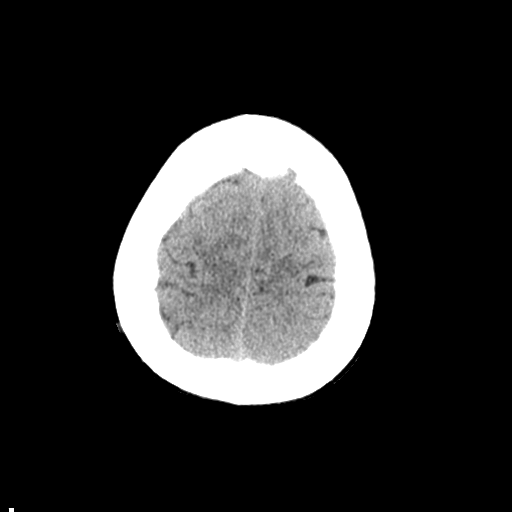
[im 26/28  brain]
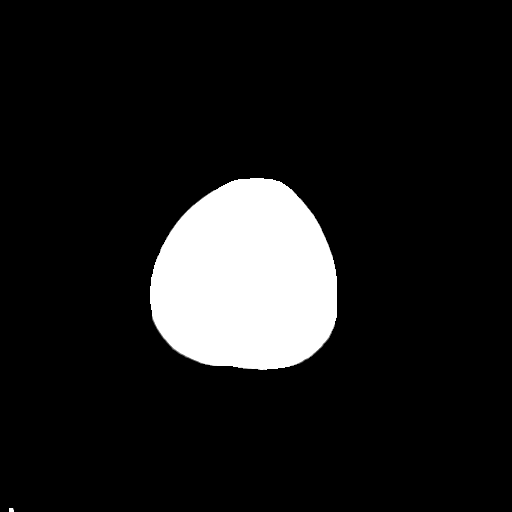

[Series 4: coronal soft tissue · coronal · 0.30mm/px · 3 of 58 slices shown]
[im 20/58  brain]
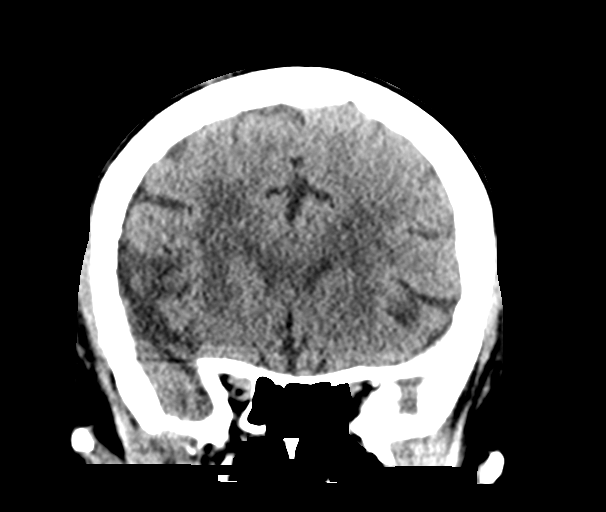
[im 26/58  brain]
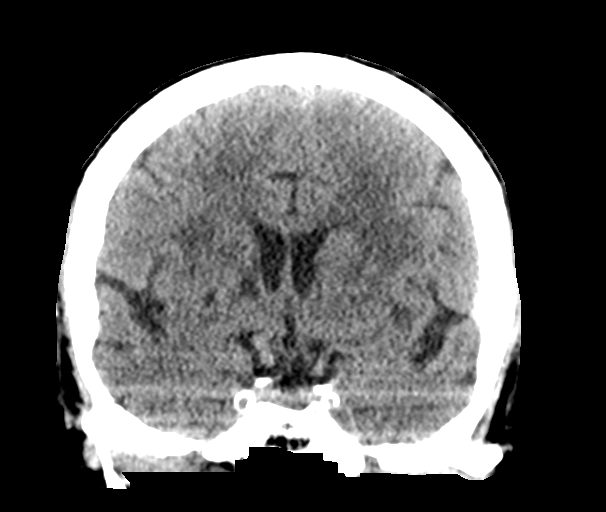
[im 32/58  brain]
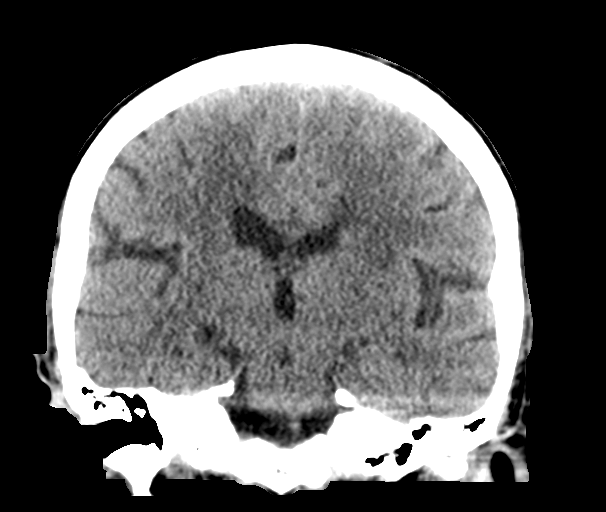

[Series 5: sagittal soft tissue · sagittal · 0.28mm/px · 3 of 50 slices shown]
[im 17/50  brain]
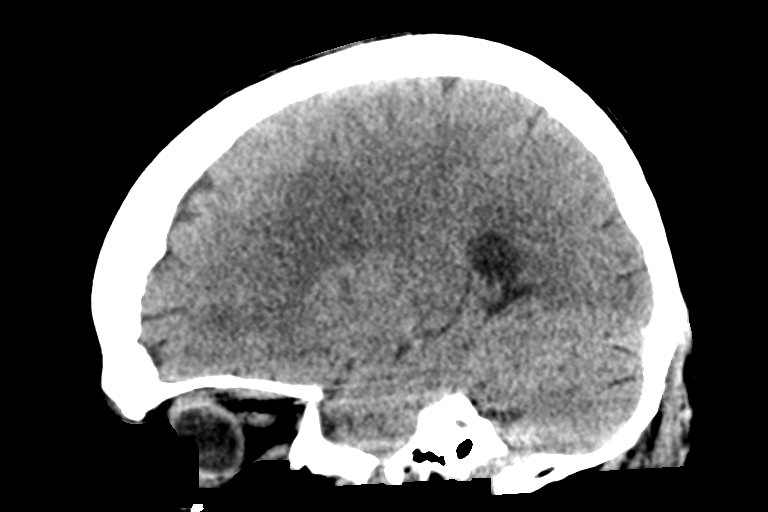
[im 25/50  brain]
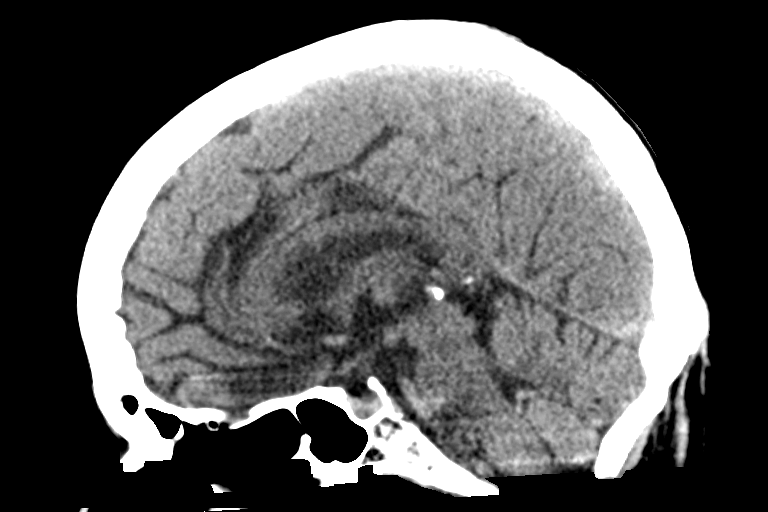
[im 33/50  brain]
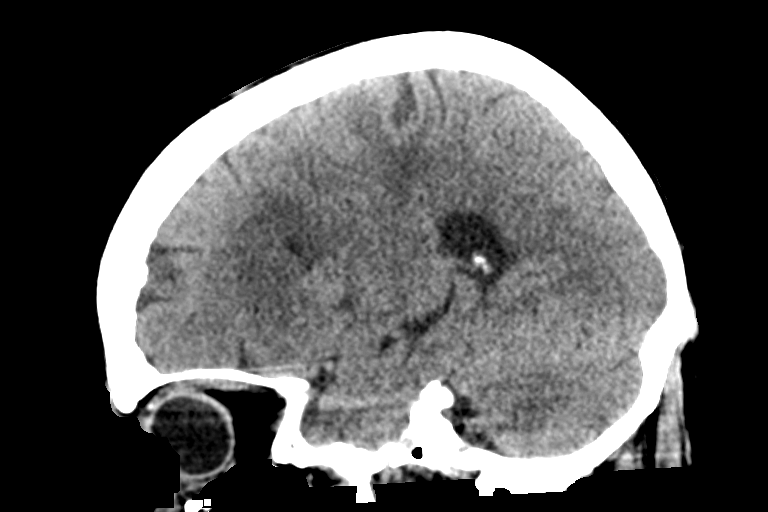

[14 of 45 positions shown; findings below may reference images not displayed]

FINDINGS: Brain: No evidence of acute infarction, hemorrhage, hydrocephalus,
extra-axial collection or mass lesion/mass effect. Small focal area
of encephalomalacia within the right frontal lobe compatible with
remote infarct, image [DATE]. Remote right thalamic lacunar infarct.
There is moderate diffuse low-attenuation within the subcortical and
periventricular white matter compatible with chronic microvascular
disease.

Vascular: No hyperdense vessel or unexpected calcification.

Skull: Normal. Negative for fracture or focal lesion.

Sinuses/Orbits: Paranasal sinuses and mastoid air cells are clear

Other: None.
IMPRESSION: 1. No acute intracranial abnormality.
2. Chronic small vessel ischemic disease and brain atrophy.
3. Remote right frontal lobe and right thalamic infarcts.

## 2022-07-21 IMAGING — MR MR HEAD W/O CM
11 series · 45 of 48 positions shown · non-contrast
Comparison: Head CT 08/07/2020 and MRI 11/05/2019

CLINICAL DATA: Right-sided headache, difficulty speaking, memory
disturbance, right leg weakness, and decreased sensation in both
legs.

EXAM:
MRI HEAD WITHOUT CONTRAST
TECHNIQUE: Multiplanar, multiecho pulse sequences of the brain and surrounding
structures were obtained without intravenous contrast.

[Series 5: ax dwi_tracew · axial · 3.0mm · 0.65mm/px · z∈[-83,+72]mm · 4 of 48 slices shown]
[im 1/48]
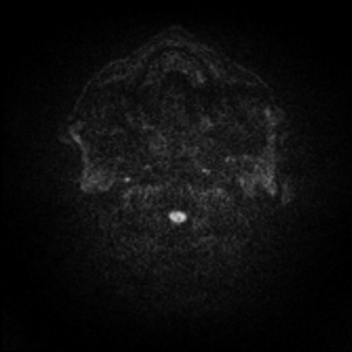
[im 16/48]
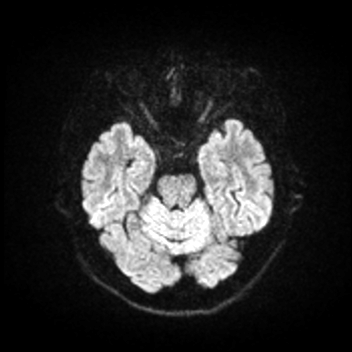
[im 32/48]
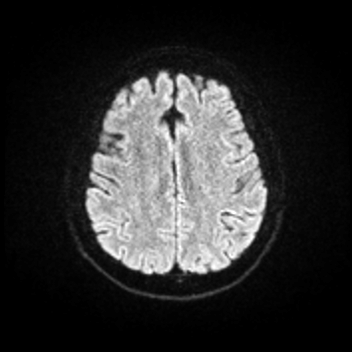
[im 48/48]
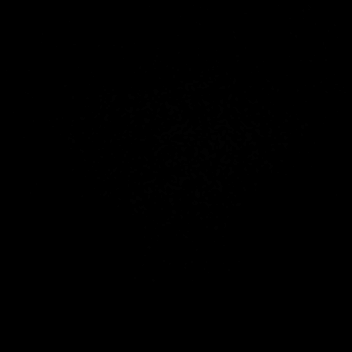

[Series 6: ax dwi_adc · axial · 3.0mm · 0.65mm/px · z∈[-83,+65]mm · 4 of 46 slices shown]
[im 1/46]
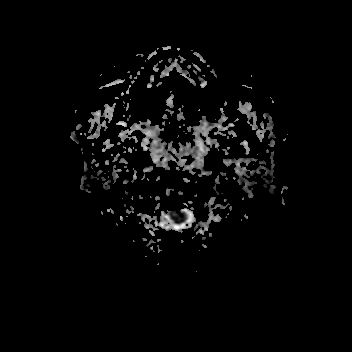
[im 16/46]
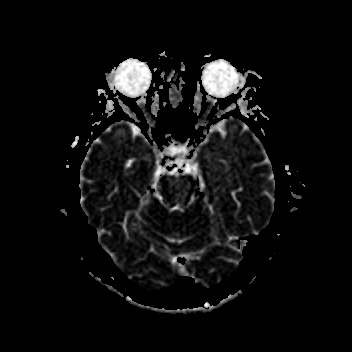
[im 31/46]
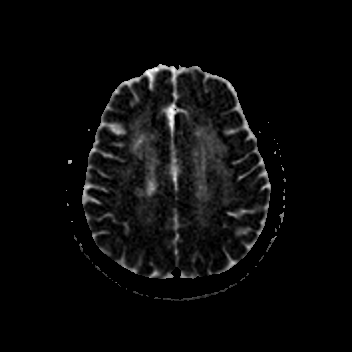
[im 46/46]
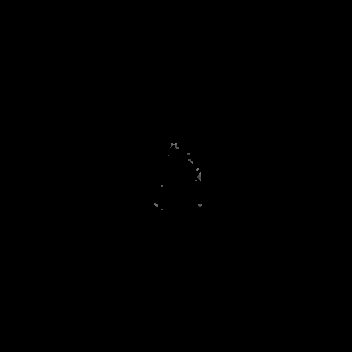

[Series 7: cor dwi_tracew · coronal · 5.0mm · 0.60mm/px · 3 of 38 slices shown]
[im 1/38]
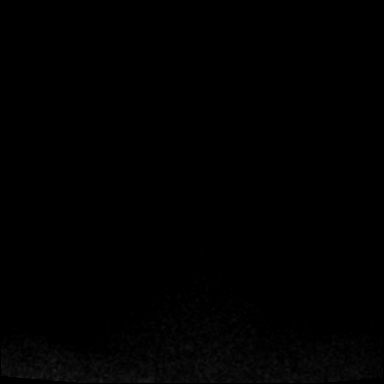
[im 19/38]
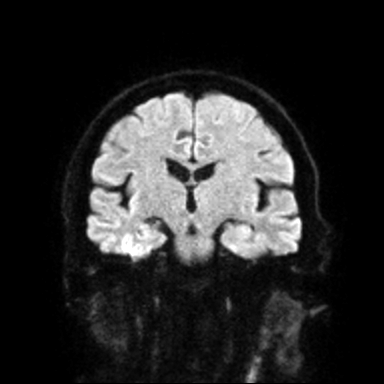
[im 38/38]
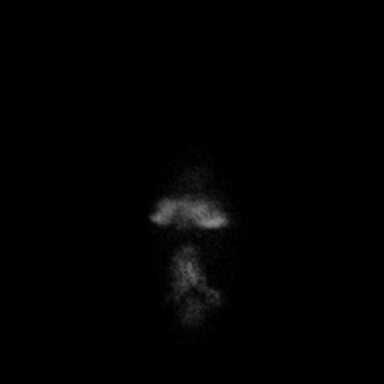

[Series 8: cor dwi_adc · coronal · 5.0mm · 0.60mm/px · 3 of 37 slices shown]
[im 1/37]
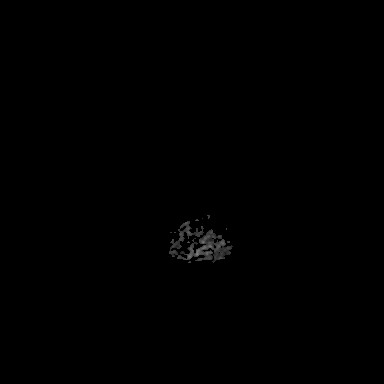
[im 19/37]
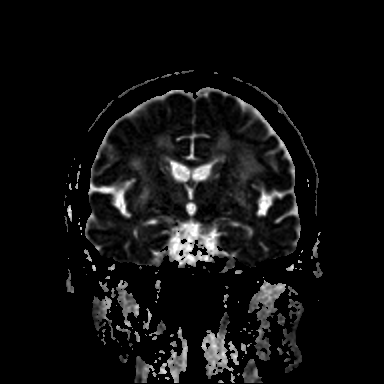
[im 37/37]
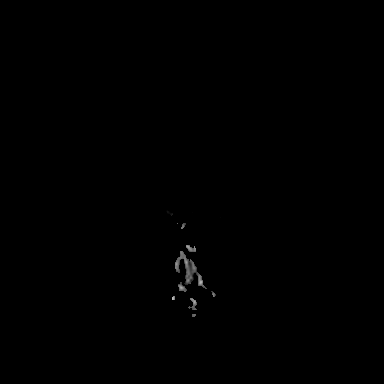

[Series 9: T1 · sagittal · 5.0mm · 0.62mm/px · 2 of 25 slices shown (1 of 2)]
[im 1/25]
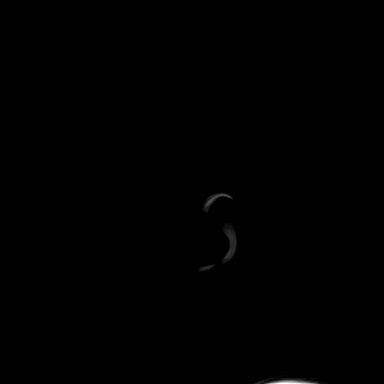
[im 25/25]
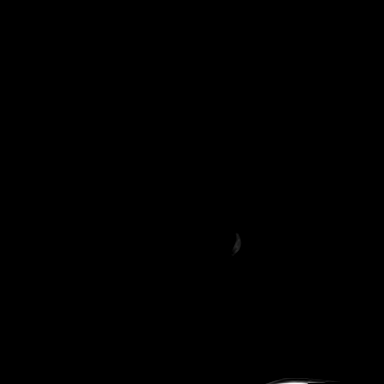

[Series 10: T2 · axial · 5.0mm · 0.53mm/px · z∈[-78,+60]mm · 2 of 24 slices shown (1 of 2)]
[im 1/24]
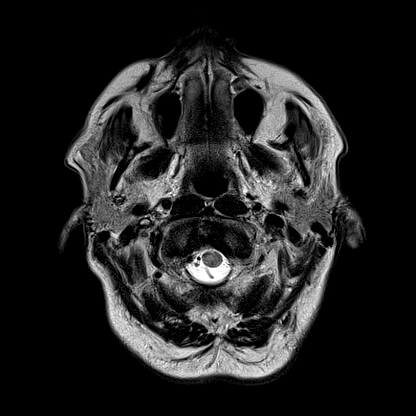
[im 24/24]
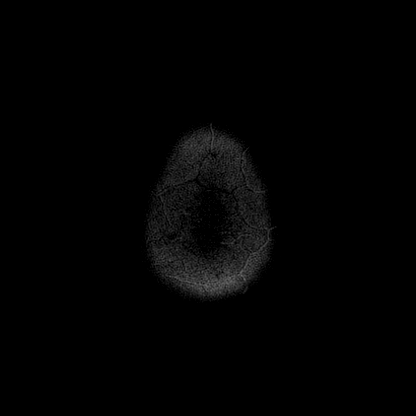

[Series 12: pha_images · axial · 3.0mm · 0.90mm/px · z∈[-94,+80]mm · 5 of 59 slices shown]
[im 1/59]
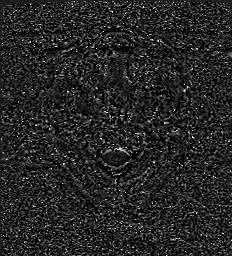
[im 15/59]
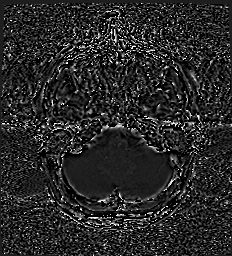
[im 30/59]
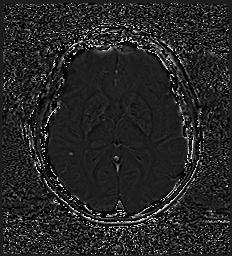
[im 44/59]
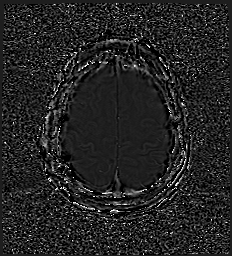
[im 59/59]
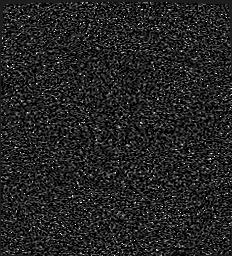

[Series 13: swi_images · axial · 3.0mm · 0.90mm/px · z∈[-94,+83]mm · 5 of 60 slices shown]
[im 1/60]
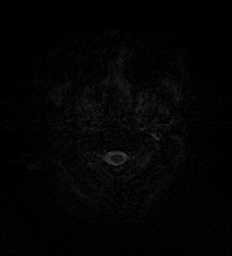
[im 15/60]
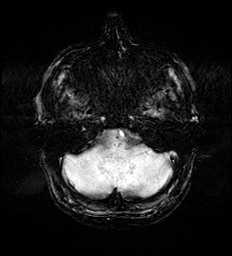
[im 30/60]
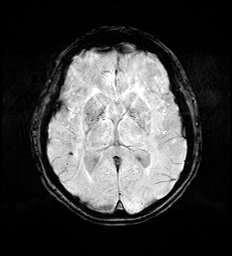
[im 45/60]
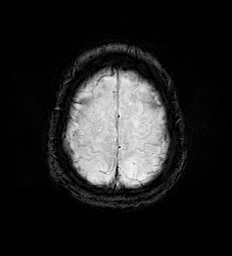
[im 60/60]
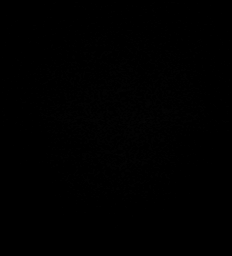

[Series 15: FLAIR · axial · 3.0mm · 0.53mm/px · z∈[-87,+75]mm · 4 of 55 slices shown]
[im 1/55]
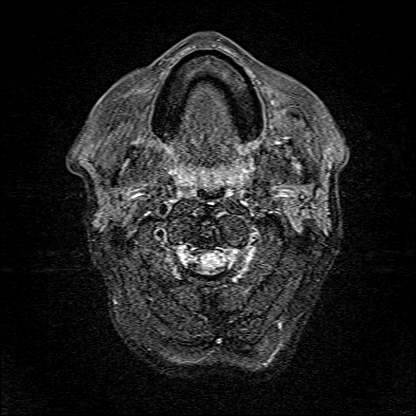
[im 19/55]
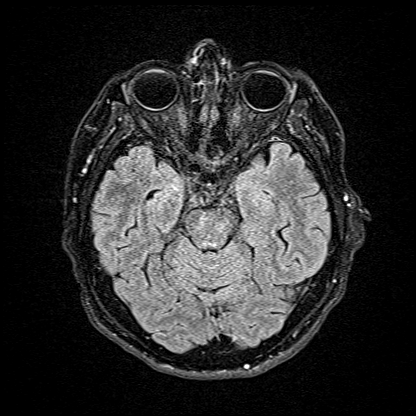
[im 37/55]
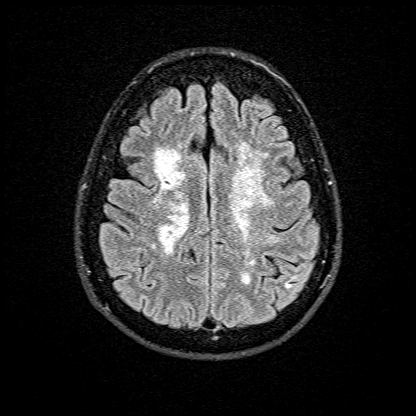
[im 55/55]
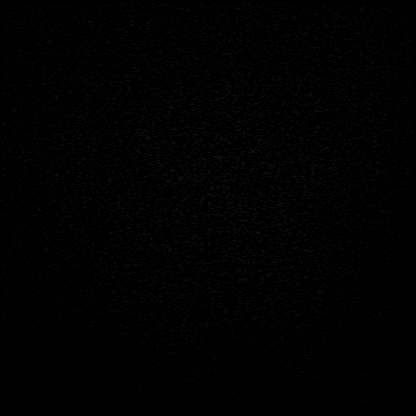

[Series 16: T1 · axial · 1.0mm · 0.98mm/px · z∈[-93,+82]mm · 11 of 174 slices shown (2 of 2)]
[im 1/174]
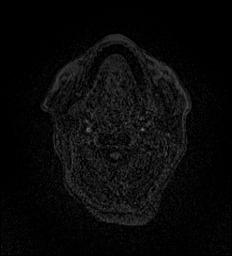
[im 14/174]
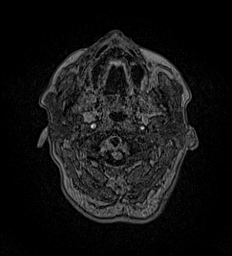
[im 27/174]
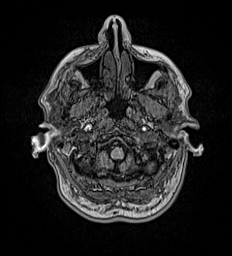
[im 40/174]
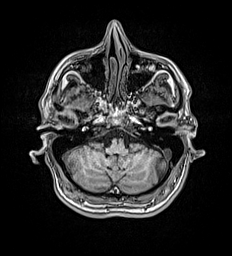
[im 54/174]
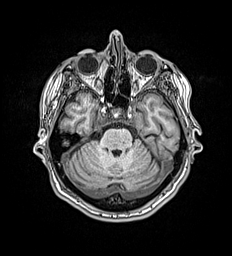
[im 67/174]
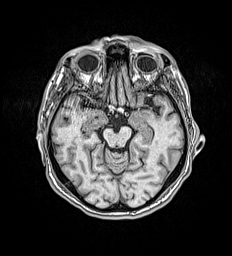
[im 80/174]
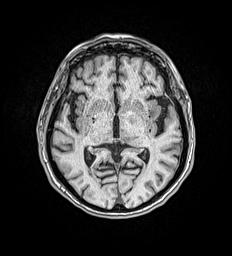
[im 94/174]
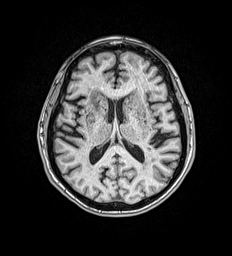
[im 120/174]
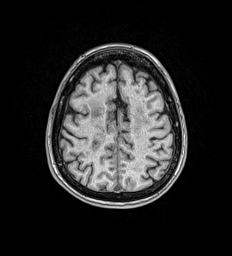
[im 147/174]
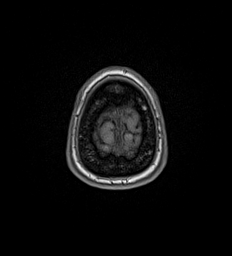
[im 174/174]
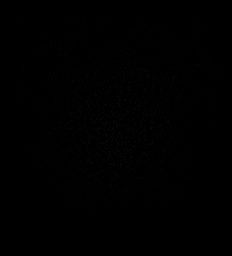

[Series 17: T2 · coronal · 5.0mm · 0.57mm/px · 2 of 29 slices shown (2 of 2)]
[im 1/29]
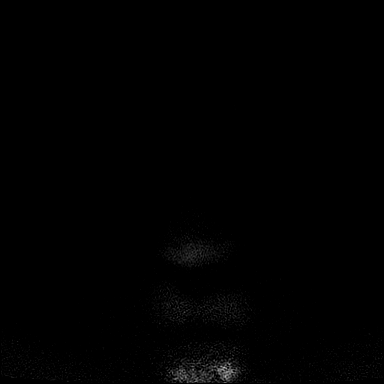
[im 29/29]
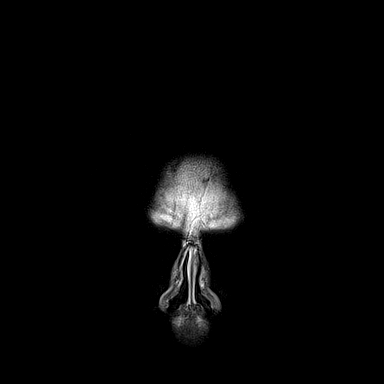

[45 of 48 positions shown; findings below may reference images not displayed]

FINDINGS: Brain: There is no evidence of an acute infarct, mass, midline
shift, or extra-axial fluid collection. A few scattered chronic
cerebral microhemorrhages are again noted in a nonspecific pattern.
Patchy and confluent T2 hyperintensities throughout the cerebral
white matter and pons have not significantly changed from the prior
MRI and are nonspecific but compatible with severe chronic small
vessel ischemic disease. Chronic lacunar infarcts are again seen in
the pons, bilateral basal ganglia, thalami, and deep cerebral white
matter. There also numerous dilated perivascular spaces in the basal
ganglia. The ventricles are normal in size.

Vascular: Major intracranial vascular flow voids are preserved.

Skull and upper cervical spine: Unremarkable bone marrow signal.

Sinuses/Orbits: Bilateral cataract extraction. Paranasal sinuses and
mastoid air cells are clear.

Other: None.
IMPRESSION: 1. No acute intracranial abnormality.
2. Severe chronic small vessel ischemic disease with multiple
chronic lacunar infarcts.

## 2022-07-21 IMAGING — CT CT HEAD W/O CM
3 series · 15 of 47 positions shown, 18 images · non-contrast
Comparison: CT head 06/17/2020

CLINICAL DATA: Neuro deficit, acute stroke suspected.

EXAM:
CT HEAD WITHOUT CONTRAST
TECHNIQUE: Contiguous axial images were obtained from the base of the skull
through the vertex without intravenous contrast.

[Series 2: head wo · axial · 0.41mm/px · z∈[+323,+448]mm · 9 of 30 slices shown, 12 images]
[im 3/30  brain]
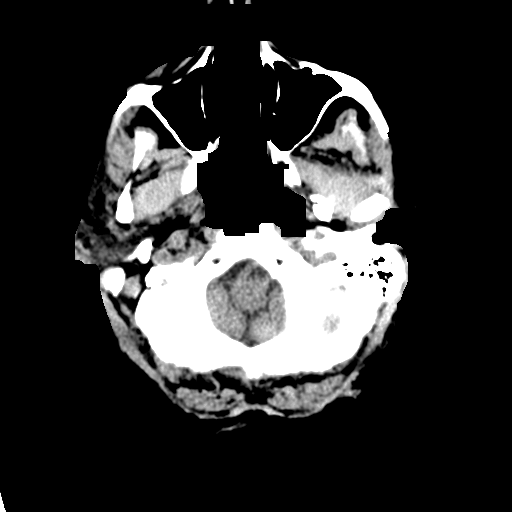
[im 3/30  bone]
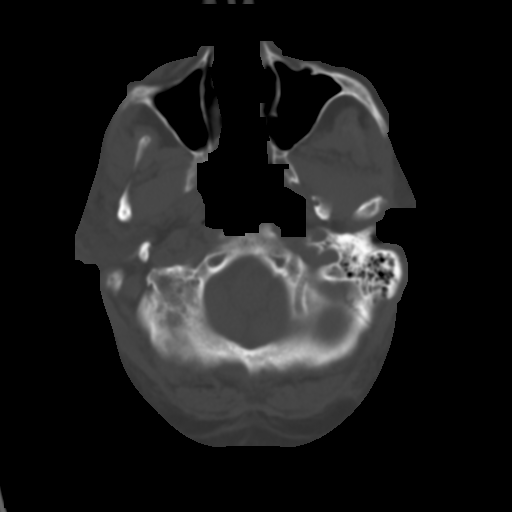
[im 6/30  brain]
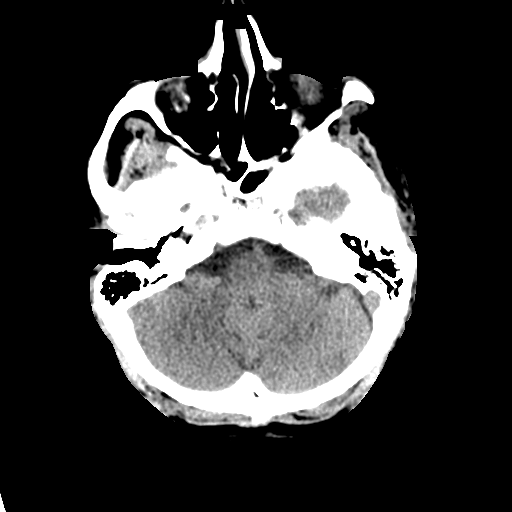
[im 9/30  brain]
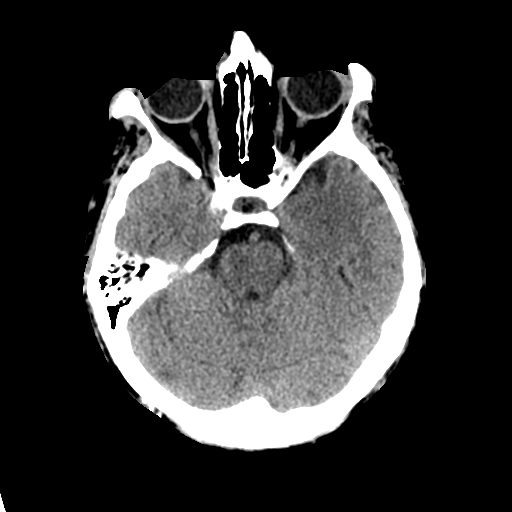
[im 12/30  brain]
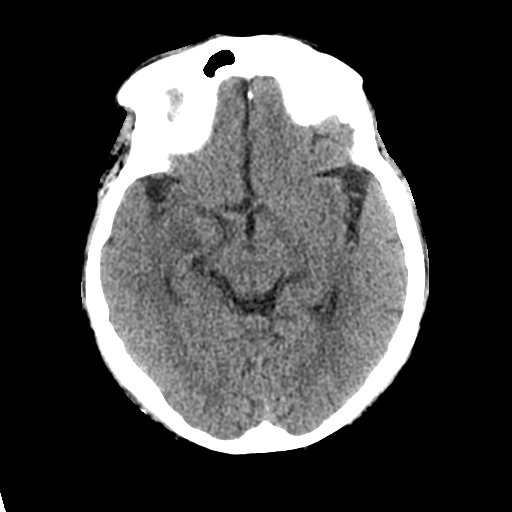
[im 16/30  brain]
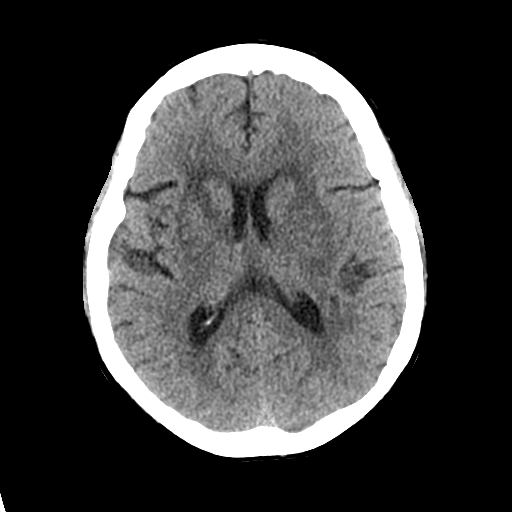
[im 16/30  bone]
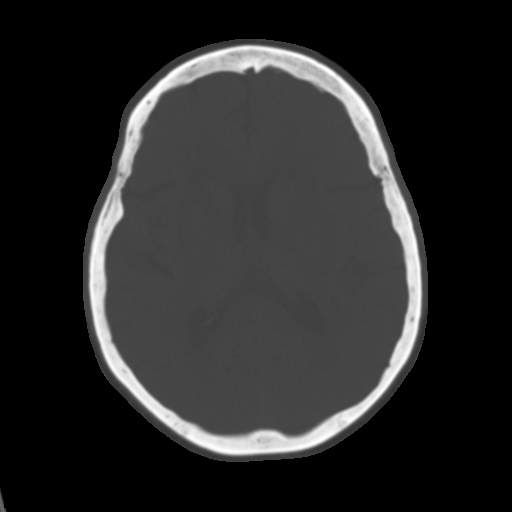
[im 19/30  brain]
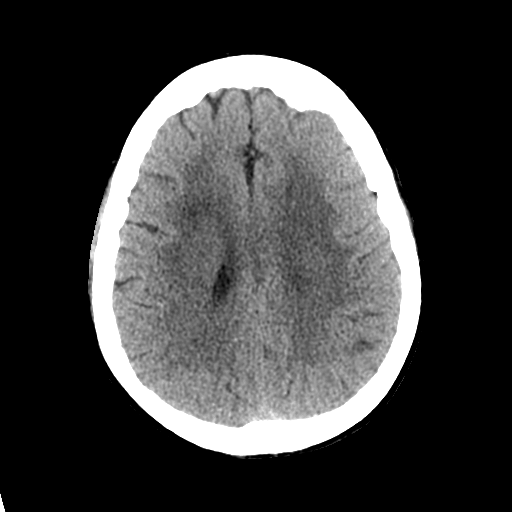
[im 22/30  brain]
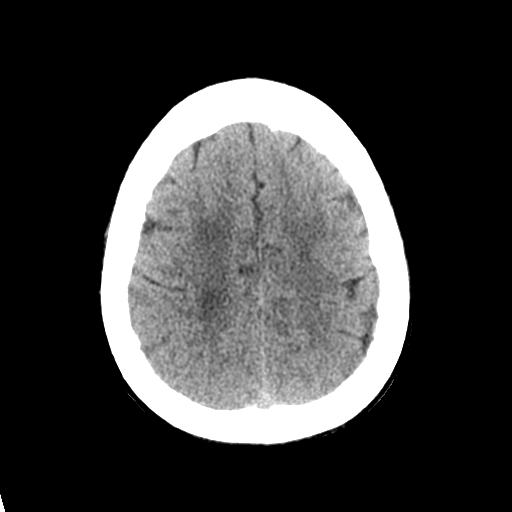
[im 25/30  brain]
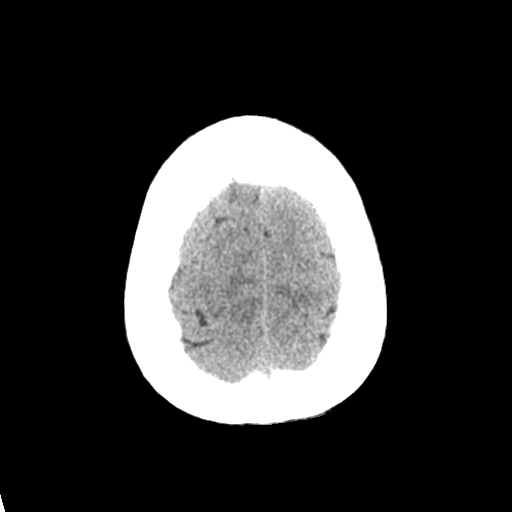
[im 28/30  brain]
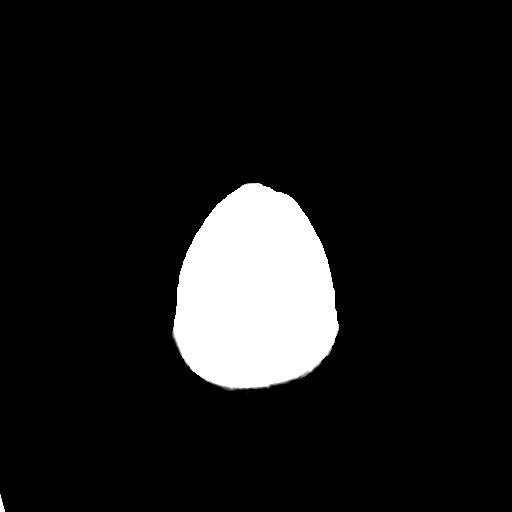
[im 28/30  bone]
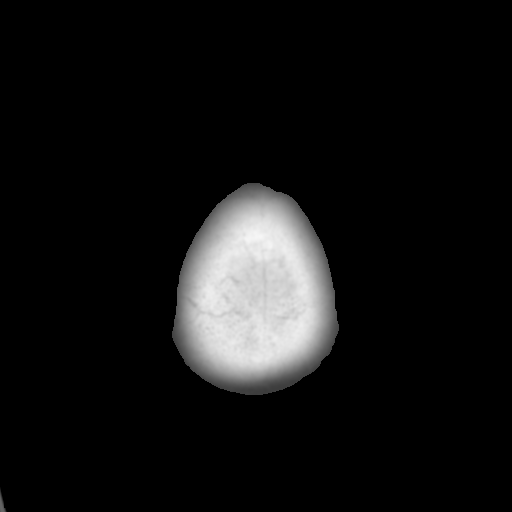

[Series 4: coronal soft tissue · coronal · 0.31mm/px · 3 of 63 slices shown]
[im 21/63  brain]
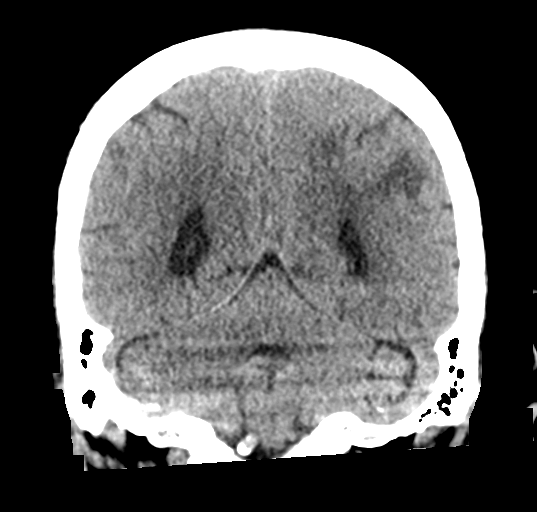
[im 28/63  brain]
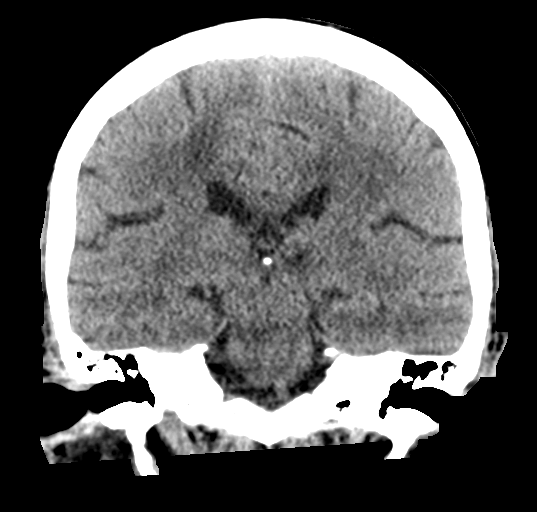
[im 35/63  brain]
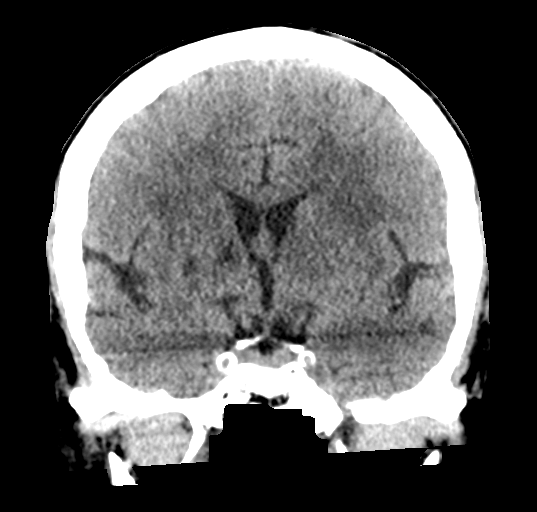

[Series 5: sagittal soft tissue · sagittal · 0.32mm/px · 3 of 53 slices shown]
[im 18/53  brain]
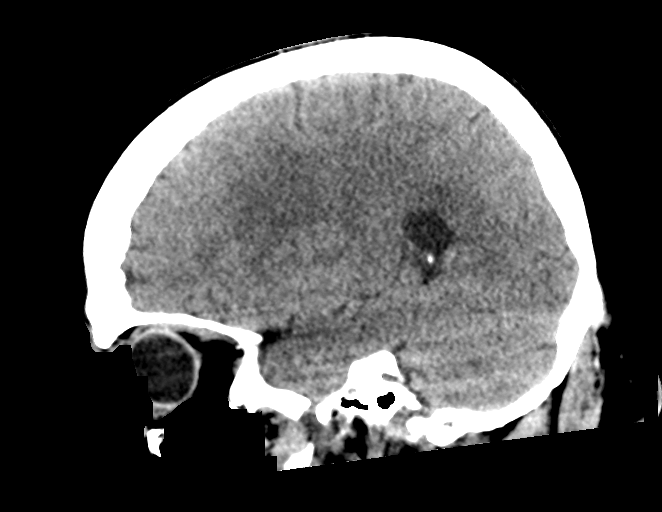
[im 27/53  brain]
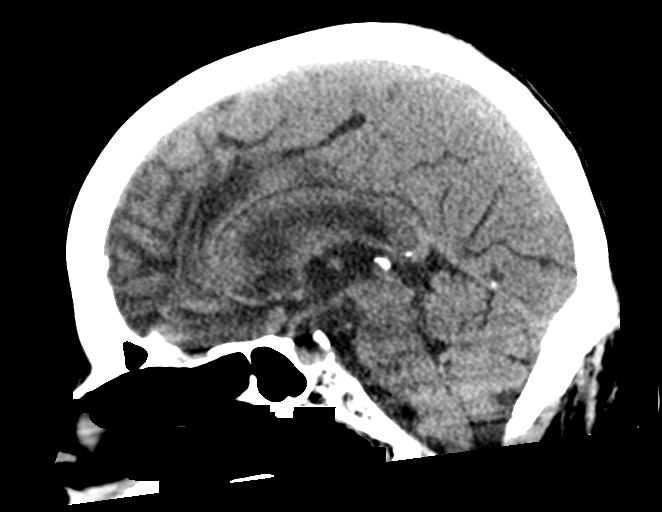
[im 35/53  brain]
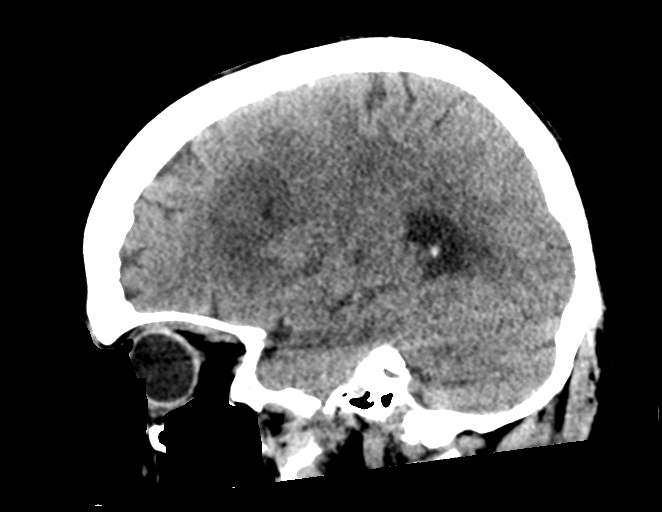

[15 of 47 positions shown; findings below may reference images not displayed]

FINDINGS: Brain:

Patchy and confluent areas of decreased attenuation are noted
throughout the deep and periventricular white matter of the cerebral
hemispheres bilaterally, compatible with chronic microvascular
ischemic disease. Similar-appearing right basilar lacunar
infarction.

No evidence of large-territorial acute infarction. No parenchymal
hemorrhage. No mass lesion. No extra-axial collection.

No mass effect or midline shift. No hydrocephalus. Basilar cisterns
are patent.

Vascular: No hyperdense vessel. Atherosclerotic calcifications are
present within the cavernous internal carotid and vertebral
arteries.

Skull: No acute fracture or focal lesion.

Sinuses/Orbits: Paranasal sinuses and mastoid air cells are clear.
Bilateral lens replacement. Otherwise the orbits are unremarkable.

Other: None.
IMPRESSION: No acute intracranial abnormality.

## 2022-07-22 IMAGING — US US CAROTID DUPLEX BILAT
1 series · 13 of 24 positions shown · non-contrast
Comparison: CT neck 09/20/2019

CLINICAL DATA: CVA. Hypertension. Stroke/TIA. Prior coronary artery
disease. Hyperlipidemia. Diabetes. Vascular surgery.

EXAM:
BILATERAL CAROTID DUPLEX ULTRASOUND
TECHNIQUE: Gray scale imaging, color Doppler and duplex ultrasound were
performed of bilateral carotid and vertebral arteries in the neck.

[Series 1: us carotid bilateral · 13 of 66 slices shown]
[im 1/66]
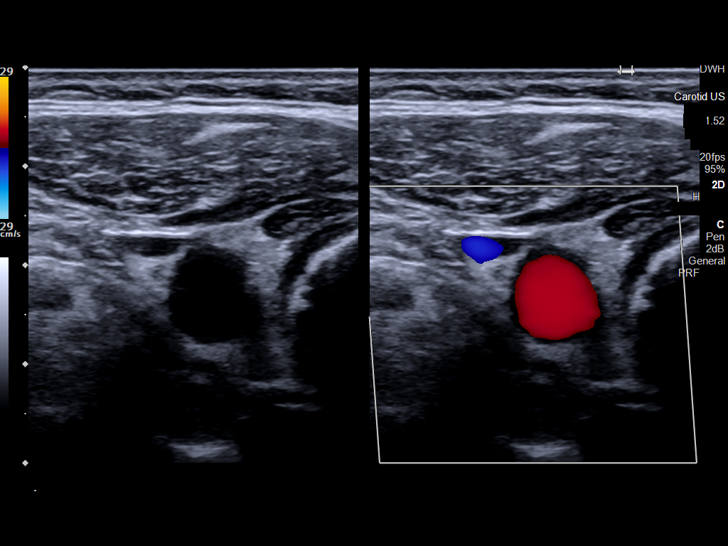
[im 6/66]
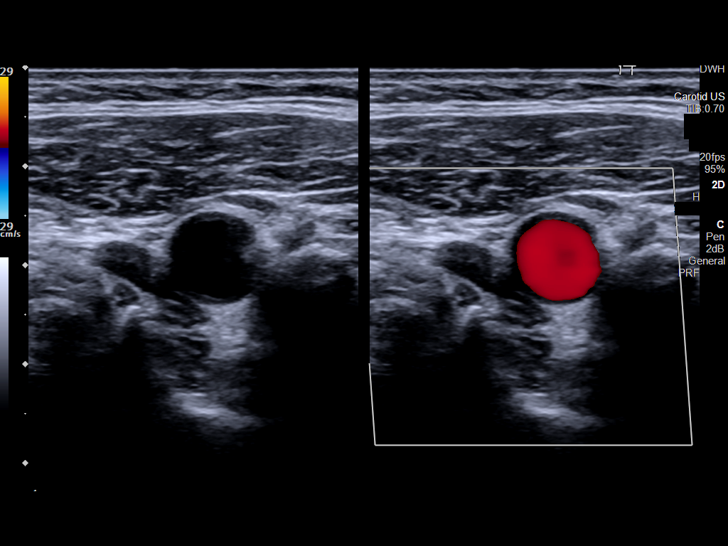
[im 12/66]
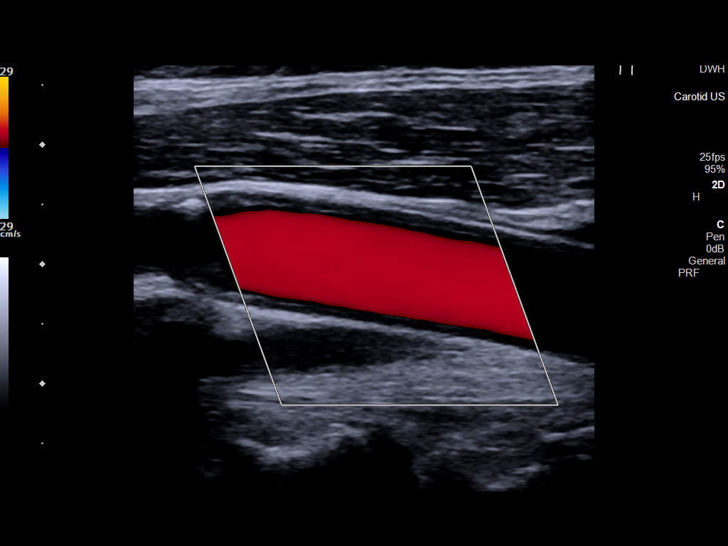
[im 17/66]
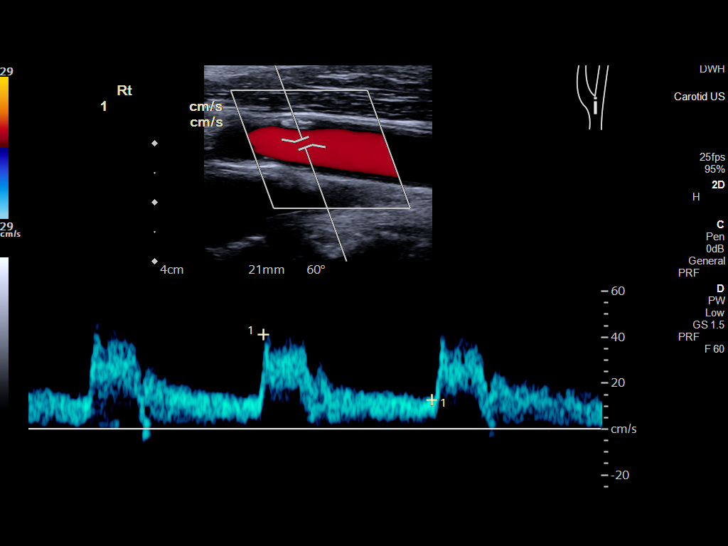
[im 23/66]
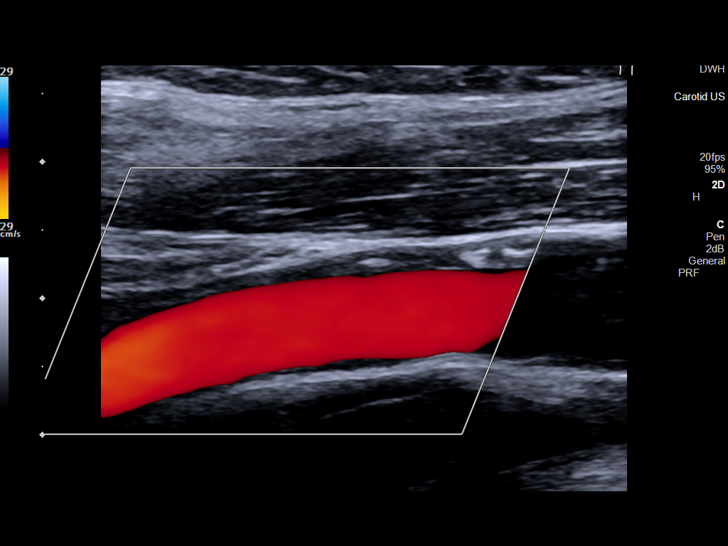
[im 29/66]
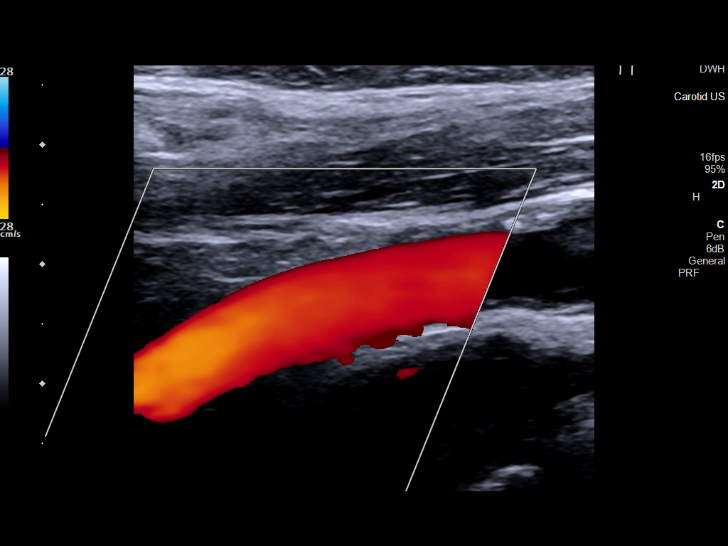
[im 34/66]
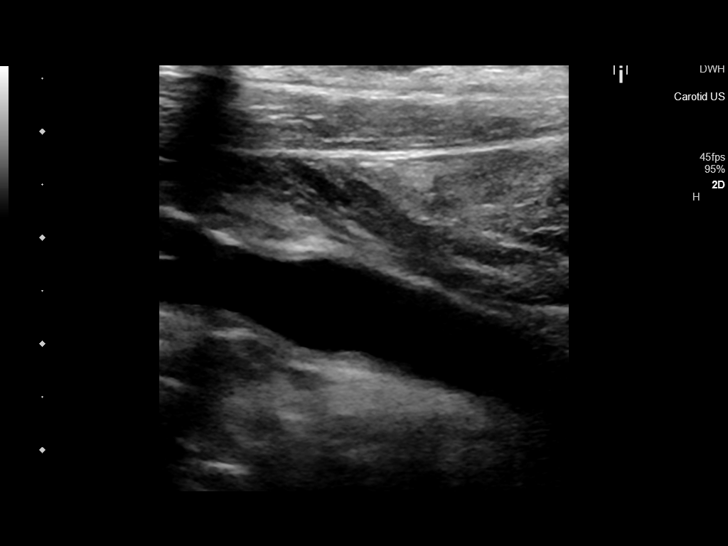
[im 37/66]
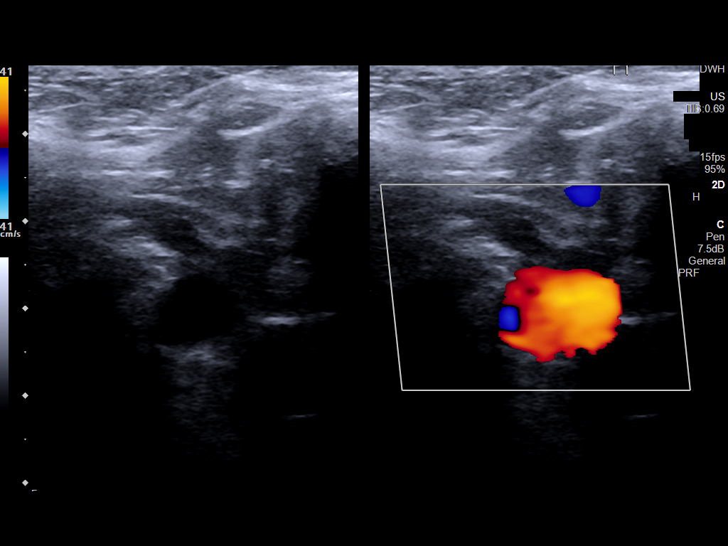
[im 43/66]
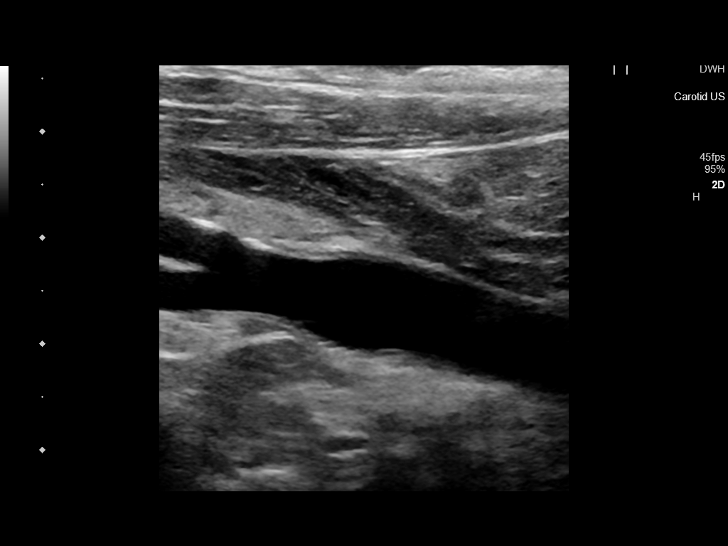
[im 49/66]
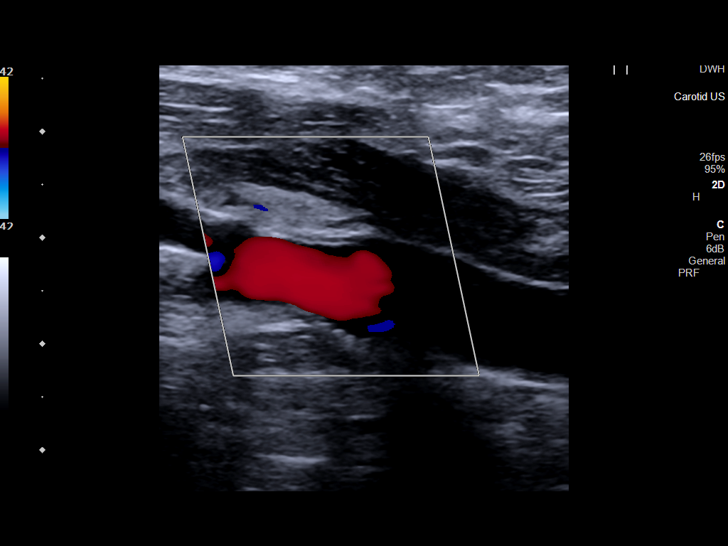
[im 54/66]
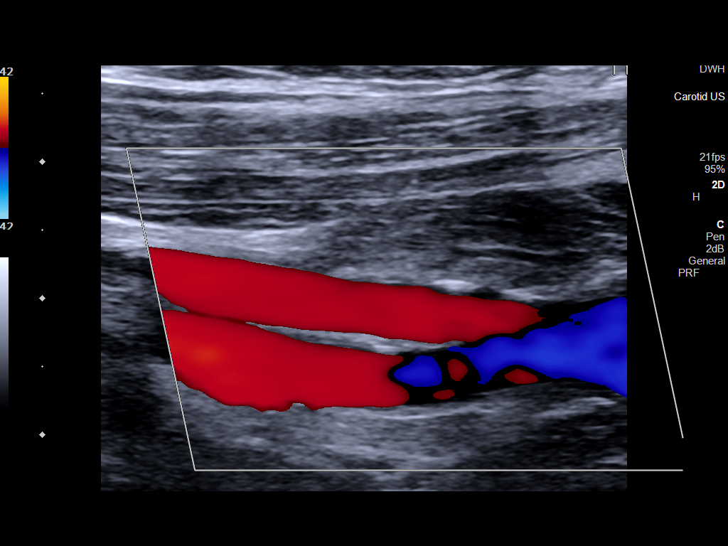
[im 60/66]
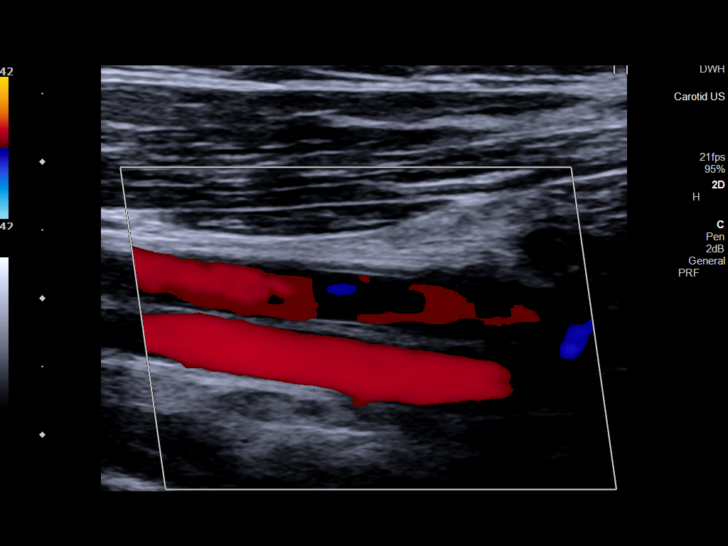
[im 66/66]
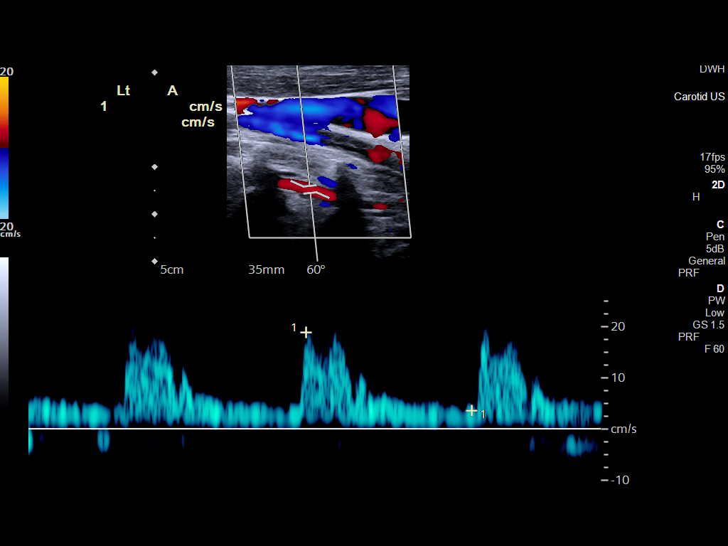

[13 of 24 positions shown; findings below may reference images not displayed]

FINDINGS: Criteria: Quantification of carotid stenosis is based on velocity
parameters that correlate the residual internal carotid diameter
with NASCET-based stenosis levels, using the diameter of the distal
internal carotid lumen as the denominator for stenosis measurement.

The following velocity measurements were obtained:

RIGHT

ICA: 54/20 cm/sec

CCA: 55/13 cm/sec

SYSTOLIC ICA/CCA RATIO:

ECA: 52 cm/sec

LEFT

ICA: 52/15 cm/sec

CCA: 117/5 cm/sec

SYSTOLIC ICA/CCA RATIO:

ECA: 58 cm/sec

RIGHT CAROTID ARTERY: Mild scattered calcific plaque formation is
demonstrated in the carotid bifurcation. Normal homogeneous color
flow with normal waveforms.

RIGHT VERTEBRAL ARTERY:  Antegrade flow direction.

LEFT CAROTID ARTERY: No significant calcific plaque formation.
Normal homogeneous flow on color flow Doppler imaging. Normal
waveforms. Note of low bifurcation of the carotid just superior to
the clavicle.

LEFT VERTEBRAL ARTERY:  Antegrade flow direction.
IMPRESSION: No evidence of hemodynamically significant stenosis of either right
or the left internal carotid artery.

## 2022-07-22 IMAGING — DX DG KNEE 1-2V*R*
2 series · 2 of 2 positions shown · non-contrast
Comparison: None.

CLINICAL DATA: Acute right knee pain.

EXAM:
RIGHT KNEE - 1-2 VIEW

[knee ap]
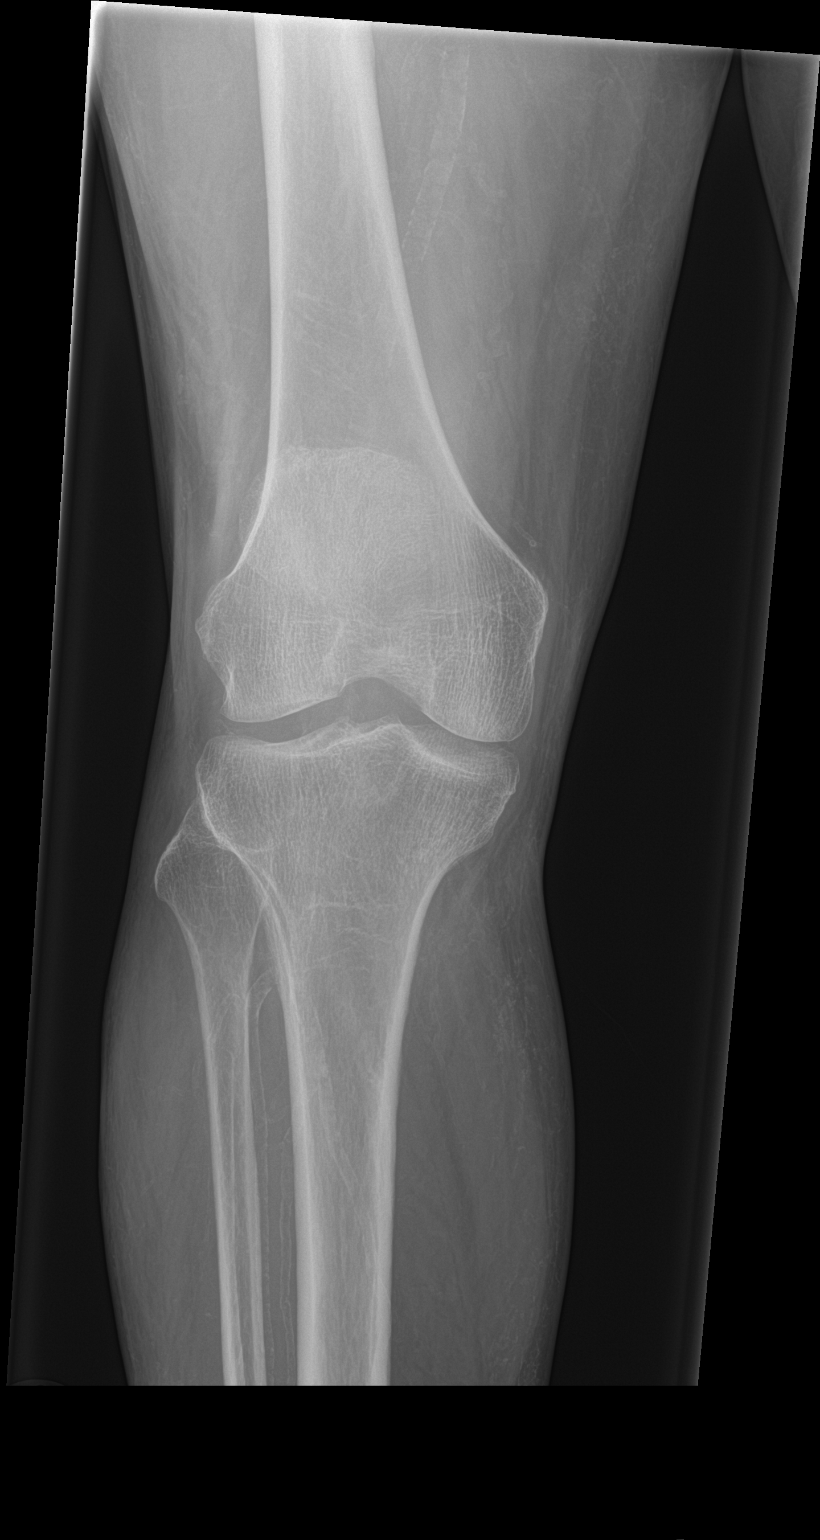

[knee lat]
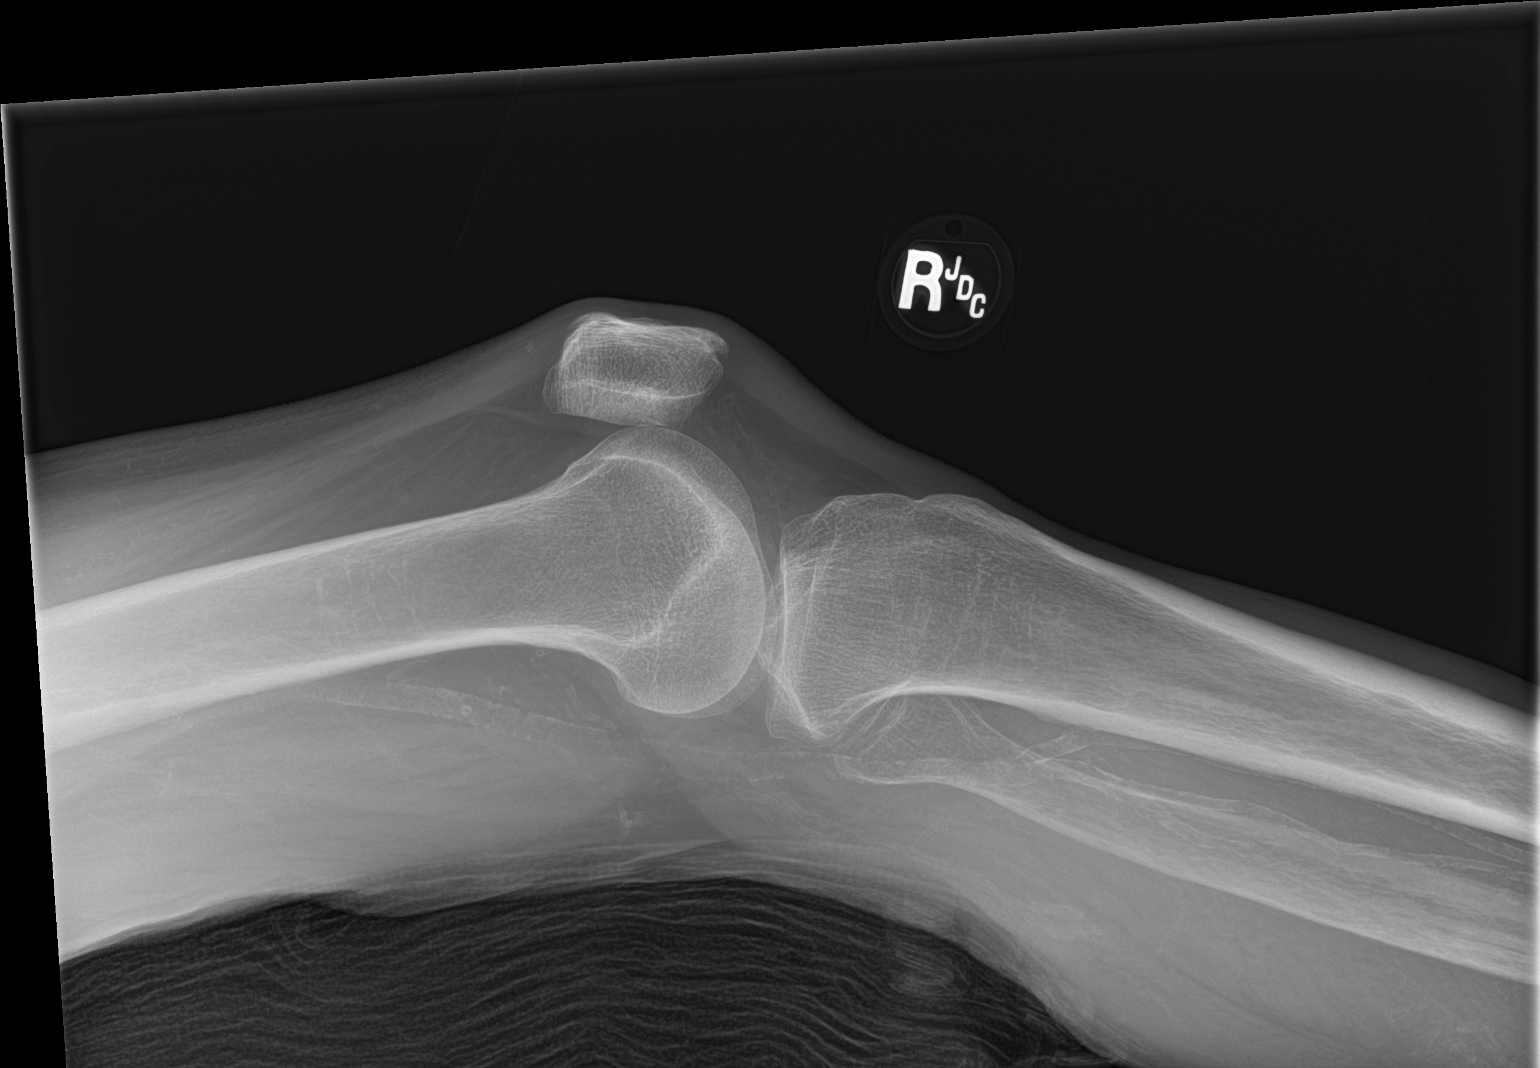

[2 of 2 positions shown; findings below may reference images not displayed]

FINDINGS: No evidence of fracture, dislocation, or joint effusion. No evidence
of arthropathy or other focal bone abnormality. Soft tissues are
unremarkable.
IMPRESSION: Negative.

## 2022-11-06 IMAGING — MR MR WRIST*R* W/O CM
6 series · 40 of 40 positions shown · non-contrast
Comparison: Wrist radiograph 06/09/2020

CLINICAL DATA: Fall, prior fracture.

EXAM:
MR OF THE RIGHT WRIST WITHOUT CONTRAST
TECHNIQUE: Multiplanar, multisequence MR imaging of the right wrist was
performed. No intravenous contrast was administered.

[Series 3: T2 fat-sat · axial · right · 2.0mm · 0.47mm/px · z∈[-55,+21]mm · 10 of 33 slices shown (1 of 2)]
[im 1/33]
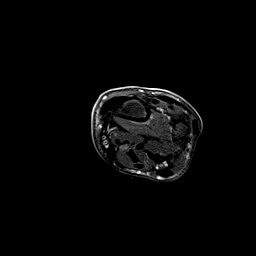
[im 4/33]
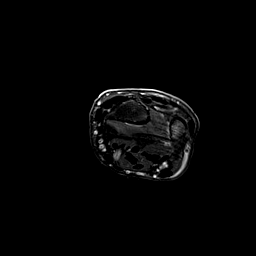
[im 8/33]
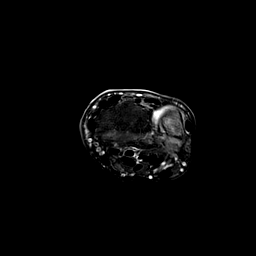
[im 11/33]
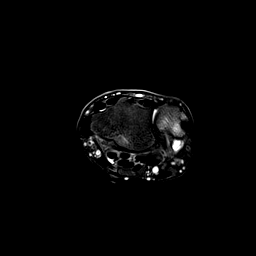
[im 15/33]
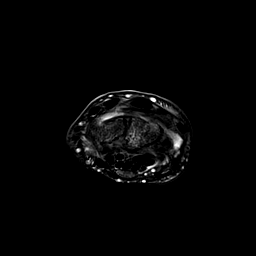
[im 18/33]
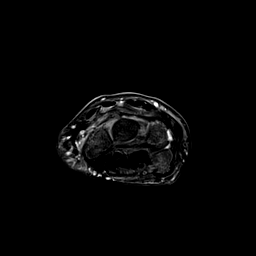
[im 22/33]
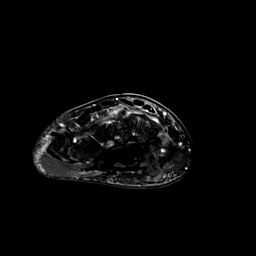
[im 25/33]
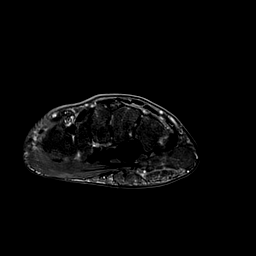
[im 29/33]
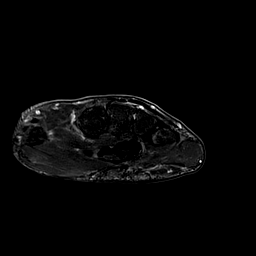
[im 33/33]
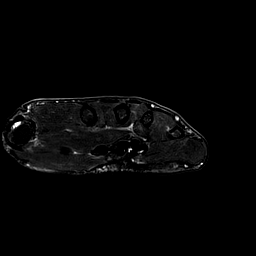

[Series 4: T1 · axial · right · 2.0mm · 0.47mm/px · z∈[-55,+21]mm · 10 of 33 slices shown (1 of 2)]
[im 1/33]
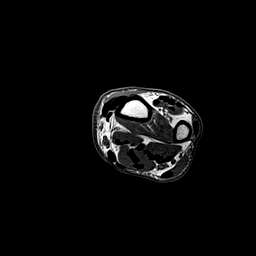
[im 4/33]
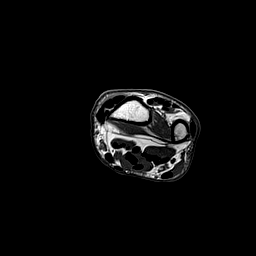
[im 8/33]
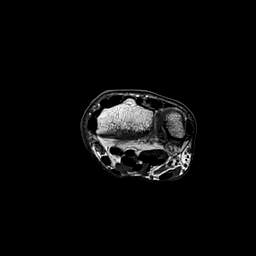
[im 11/33]
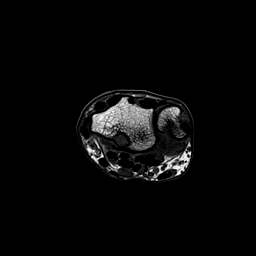
[im 15/33]
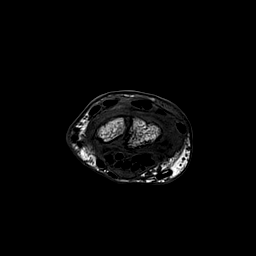
[im 18/33]
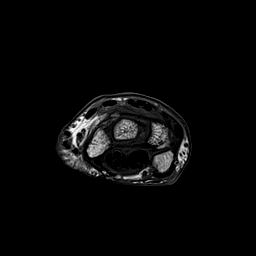
[im 22/33]
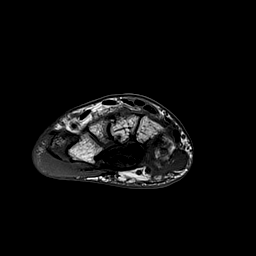
[im 25/33]
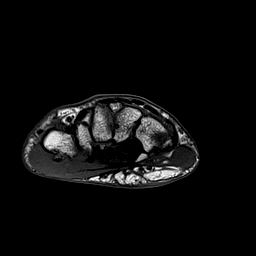
[im 29/33]
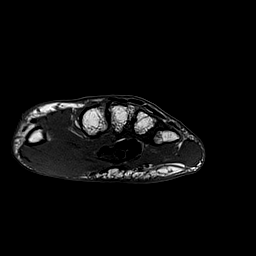
[im 33/33]
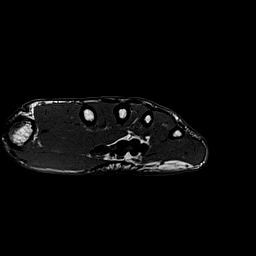

[Series 5: T1 · coronal · right · 3.0mm · 0.39mm/px · 4 of 13 slices shown (2 of 2)]
[im 1/13]
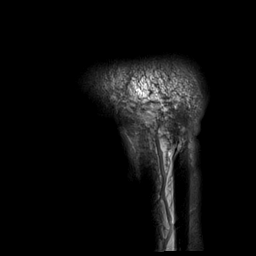
[im 5/13]
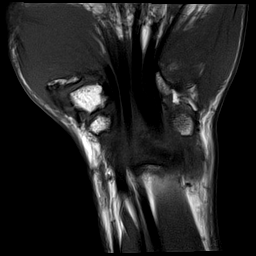
[im 9/13]
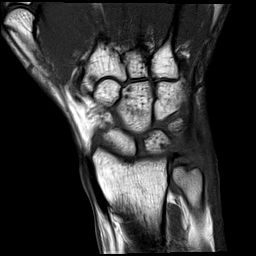
[im 13/13]
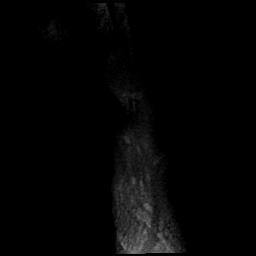

[Series 6: T2 fat-sat · coronal · right · 3.0mm · 0.39mm/px · 4 of 13 slices shown (2 of 2)]
[im 1/13]
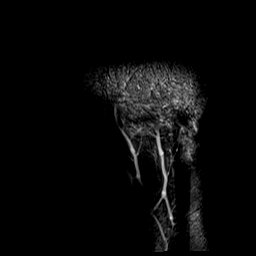
[im 5/13]
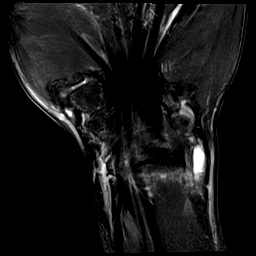
[im 9/13]
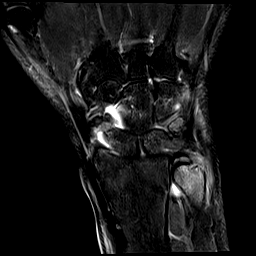
[im 13/13]
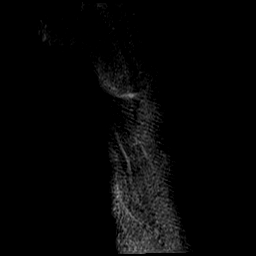

[Series 7: PD fat-sat · coronal · right · 3.0mm · 0.39mm/px · 4 of 13 slices shown (1 of 2)]
[im 1/13]
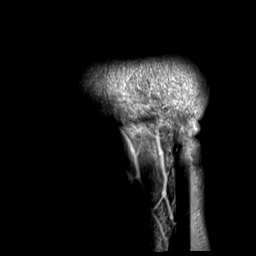
[im 5/13]
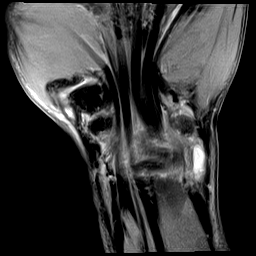
[im 9/13]
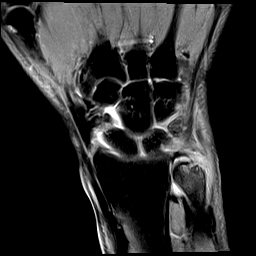
[im 13/13]
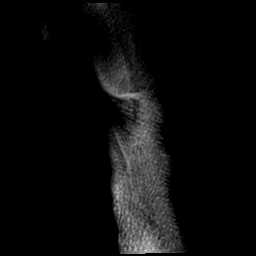

[Series 8: PD fat-sat · sagittal · right · 3.0mm · 0.39mm/px · 8 of 26 slices shown (2 of 2)]
[im 1/26]
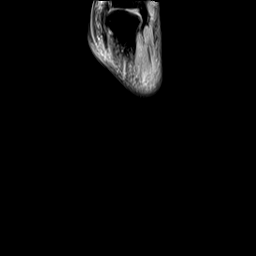
[im 4/26]
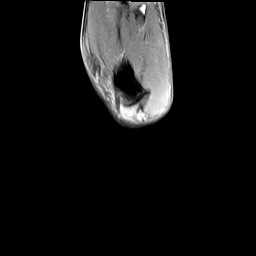
[im 8/26]
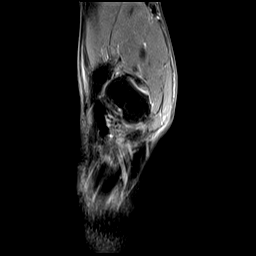
[im 11/26]
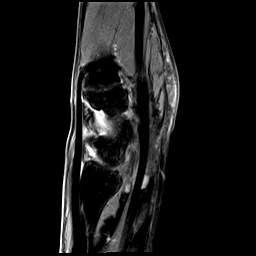
[im 15/26]
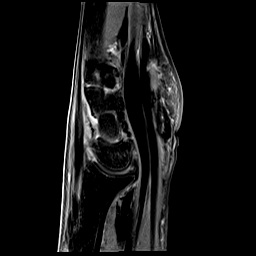
[im 18/26]
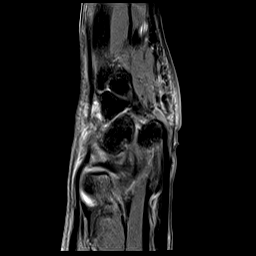
[im 22/26]
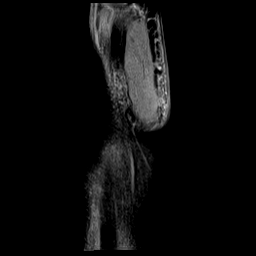
[im 26/26]
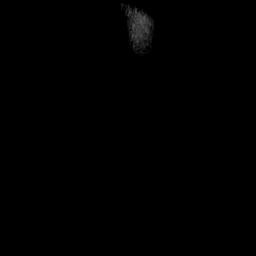

[40 of 40 positions shown; findings below may reference images not displayed]

FINDINGS: Ligaments: Scapholunate and lunotriquetral ligaments are intact.

Triangular fibrocartilage: TFCC degeneration with evidence of
undersurface tearing (coronal T2/PD image 6). The foveal and styloid
attachments on the ulna are visible and intact but demonstrate some
intermediate signal and irregularity.

Tendons: Mild focal tenosynovitis of the second extensor compartment
tendons at the level of Lister's tubercle. There is edema signal
within the ulnar groove which is likely reactive. The ECU tendon is
intact without significant tenosynovitis. The other extensor tendons
are unremarkable. Flexor tendons are unremarkable.

Carpal tunnel/median nerve: Flexor retinaculum is intact. Normal
carpal tunnel without a mass. Median nerve demonstrates normal
signal and caliber.

Guyon's canal: Normal Guyon's canal. Normal ulnar nerve.

Joint/cartilage: There is mild radiocarpal and intercarpal
arthritis. Prominent DRUJ effusion with additional more mild
effusion of the radiocarpal and intercarpal joints.

Bones/carpal alignment: There is extensive bony edema within the
distal ulna, without discrete fracture line. There is subchondral
marrow edema in the distal radius and carpus compatible with marrow
change from arthritis. Likely unfused hook of hamate ossicle versus
chronic nonunion of a nondisplaced fracture.

Other: Muscles are normal. No fluid collection, hematoma, or soft
tissue mass.
IMPRESSION: Evidence of ulnar-sided wrist injury. Bony edema within the distal
ulna at the DRUJ, without discrete fracture, compatible with
contusion. TFCC disc degenerative change with evidence of small
focal undersurface tear/central perforation, and possible prior
injury involving the foveal and styloid attachments, though these
appear intact. Adjacent prominent DRUJ effusion and reactive edema
signal within the ulnar groove. Intact ECU tendon without
significant tenosynovitis.

Mild radiocarpal and intercarpal arthritis with subchondral marrow
edema, cystic change, and with mild effusion.

Mild focal tenosynovitis of the second extensor compartment tendons
at the level of Lister's tubercle.

## 2022-11-18 IMAGING — CT CT ABD-PELV W/ CM
2 of 5 series · 16 of 46 positions shown, 18 images · IV contrast (APPLIED)
Comparison: None.

CLINICAL DATA: Abdominal distension

EXAM:
CT ABDOMEN AND PELVIS WITH CONTRAST
TECHNIQUE: Multidetector CT imaging of the abdomen and pelvis was performed
using the standard protocol following bolus administration of
intravenous contrast.
CONTRAST:  60mL OMNIPAQUE IOHEXOL 350 MG/ML SOLN

[Series 5: routine abd/pel with · axial · 0.98mm/px · z∈[-813,-348]mm · 13 of 105 slices shown, 15 images]
[im 6/105  soft-tissue]
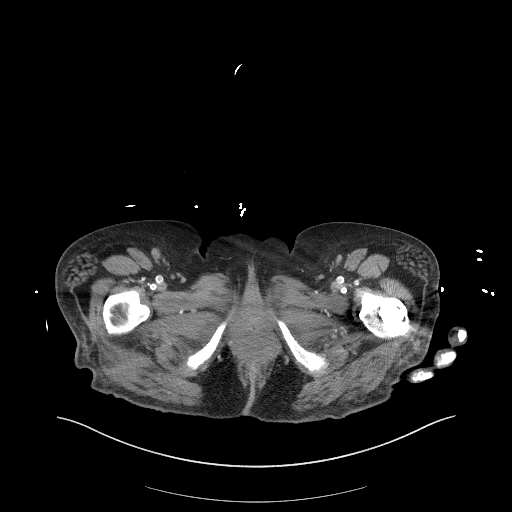
[im 6/105  bone]
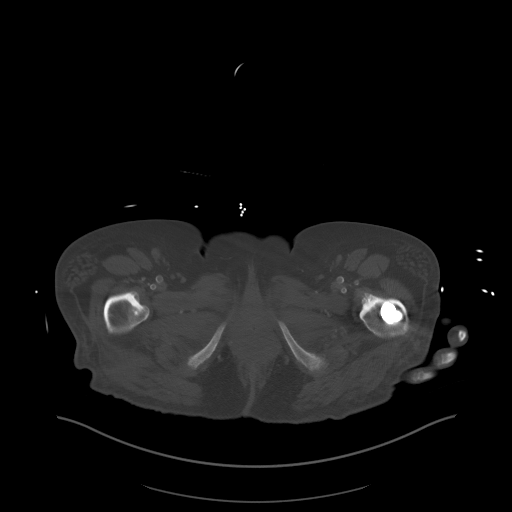
[im 12/105  soft-tissue]
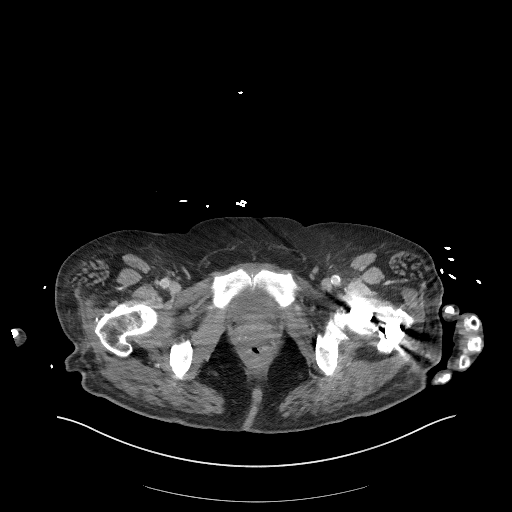
[im 24/105  soft-tissue]
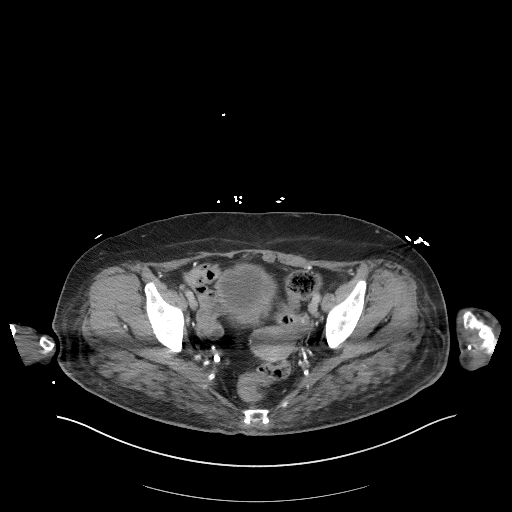
[im 29/105  soft-tissue]
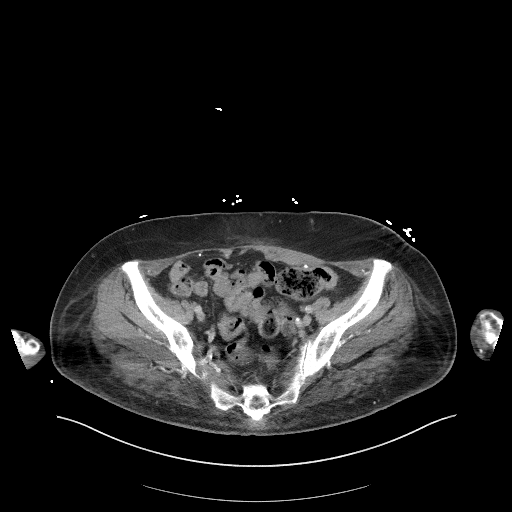
[im 35/105  soft-tissue]
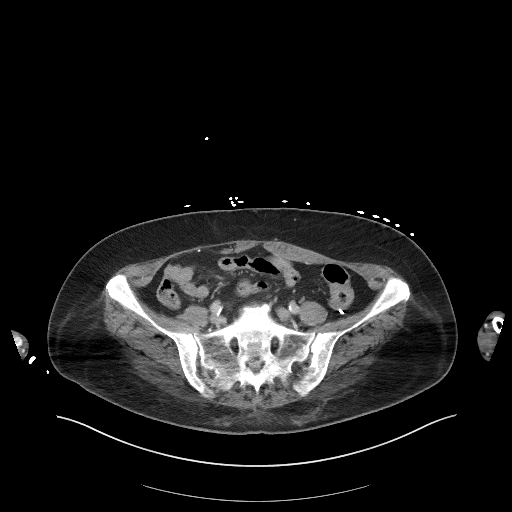
[im 47/105  soft-tissue]
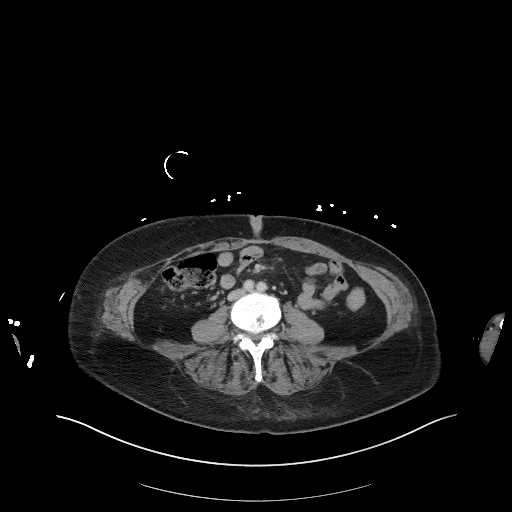
[im 53/105  soft-tissue]
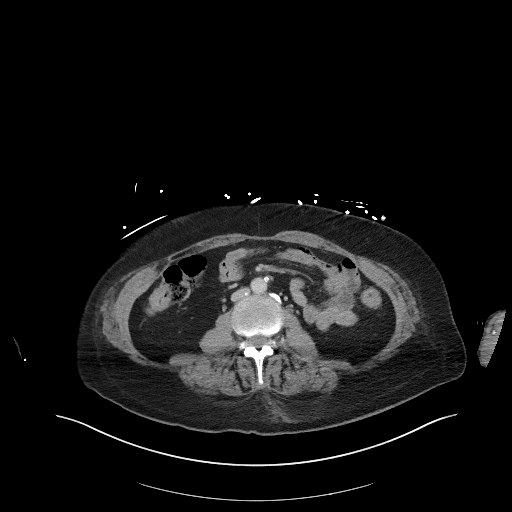
[im 58/105  soft-tissue]
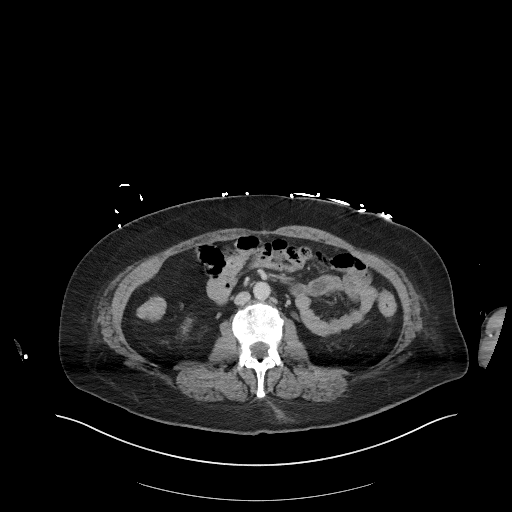
[im 70/105  soft-tissue]
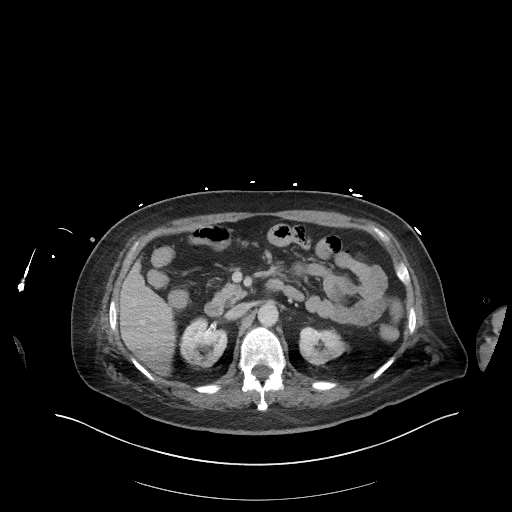
[im 70/105  bone]
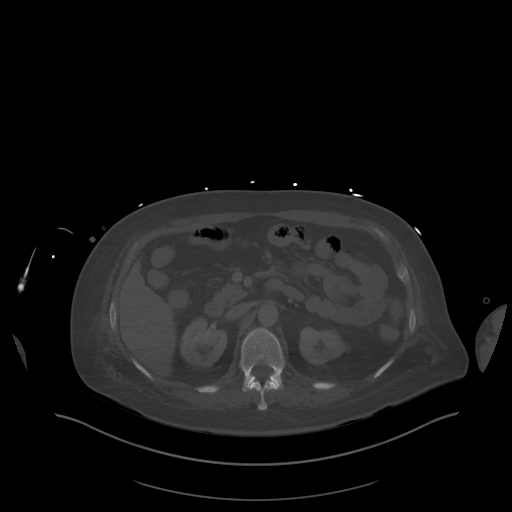
[im 76/105  soft-tissue]
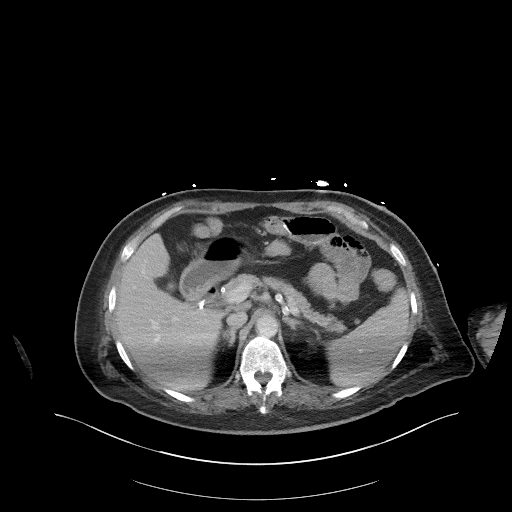
[im 81/105  soft-tissue]
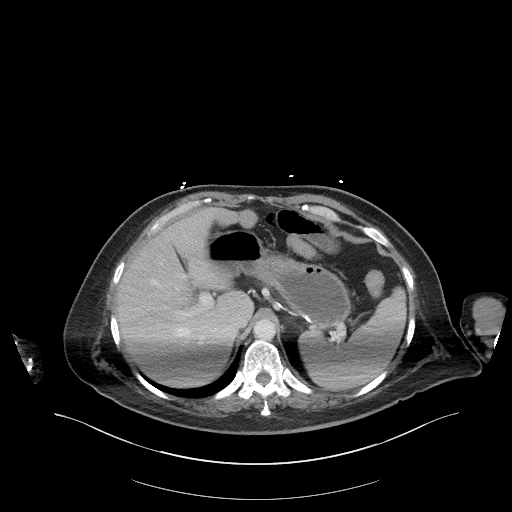
[im 93/105  soft-tissue]
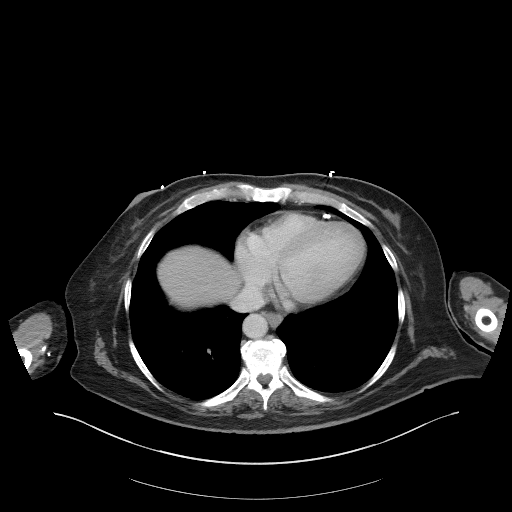
[im 99/105  soft-tissue]
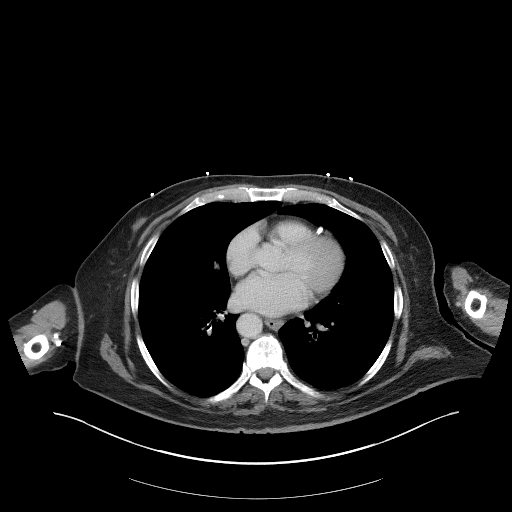

[Series 9: coronal st · coronal · 0.76mm/px · 3 of 88 slices shown]
[im 30/88  soft-tissue]
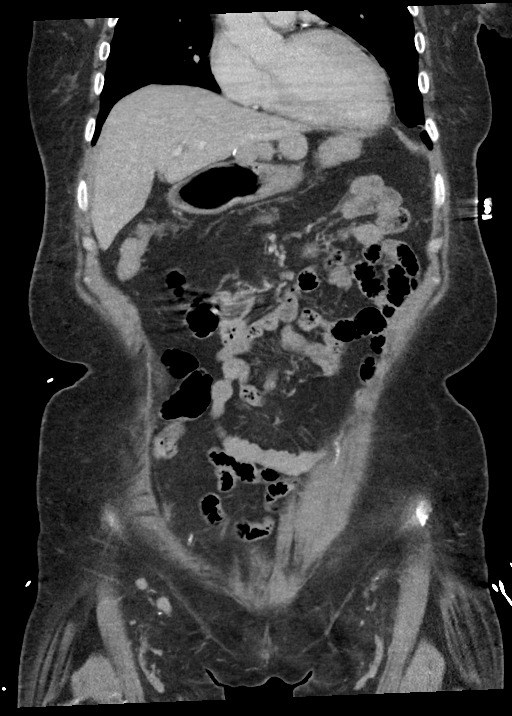
[im 39/88  soft-tissue]
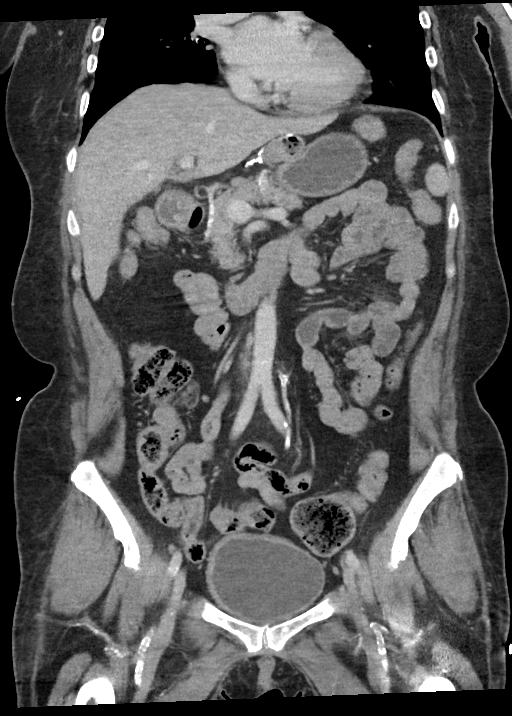
[im 49/88  soft-tissue]
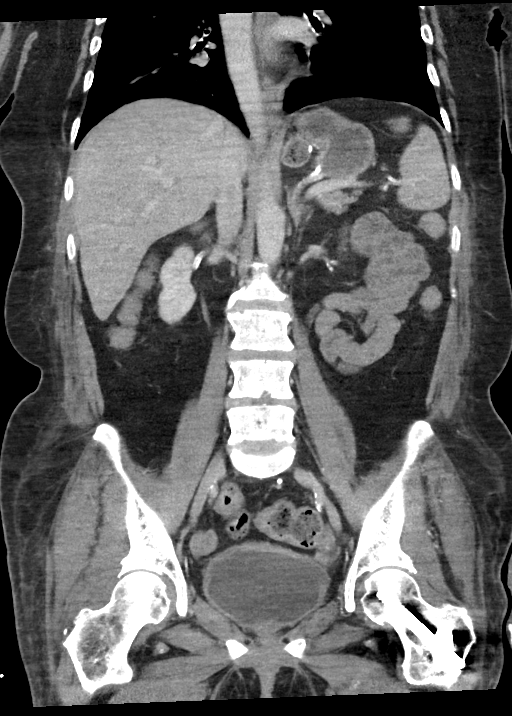

[16 of 46 positions shown; findings below may reference images not displayed]

FINDINGS: Lower chest: No acute abnormality.  Coronary artery calcifications.

Hepatobiliary: No focal liver abnormality is seen. Status post
cholecystectomy. No biliary dilatation.

Pancreas: No focal abnormality or ductal dilatation.

Spleen: No focal abnormality.  Normal size.

Adrenals/Urinary Tract: Scarring and cortical thinning in the
kidneys bilaterally. No hydronephrosis. No renal or adrenal mass.
Urinary bladder unremarkable.

Stomach/Bowel: Postoperative changes in the stomach from gastric
bypass. Hyperdense area noted in the proximal duodenum in the area
of prior hemorrhage measuring 1.3 cm. Cannot exclude duodenal
lesion. No bowel obstruction.

Vascular/Lymphatic: No evidence of aneurysm or adenopathy.

Reproductive: Uterus and adnexa unremarkable.  No mass.

Other: No free fluid or free air.

Musculoskeletal: No acute bony abnormality.
IMPRESSION: 1.3 cm hyperdense focus seen within the proximal duodenum in the
area of prior hemorrhage. Cannot exclude duodenal wall lesion/mass.
Consider GI consultation.

No acute findings in the abdomen or pelvis.

## 2022-11-18 IMAGING — CT CT HEAD W/O CM
3 series · 15 of 47 positions shown, 18 images · non-contrast
Comparison: August 07, 2020

CLINICAL DATA: Altered mental status.

EXAM:
CT HEAD WITHOUT CONTRAST
TECHNIQUE: Contiguous axial images were obtained from the base of the skull
through the vertex without intravenous contrast.

[Series 3: head wo · axial · 0.42mm/px · z∈[+515,+645]mm · 9 of 32 slices shown, 12 images]
[im 3/32  brain]
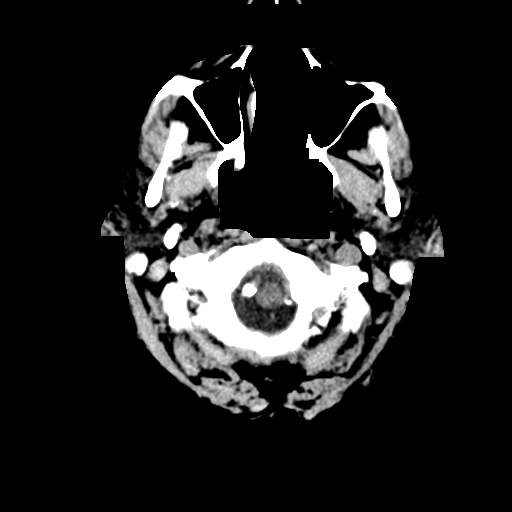
[im 3/32  bone]
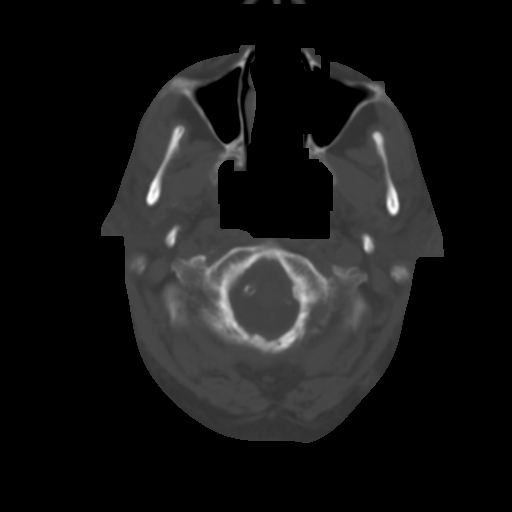
[im 6/32  brain]
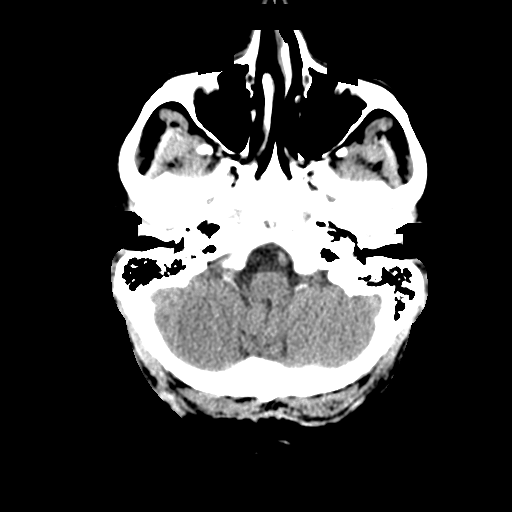
[im 9/32  brain]
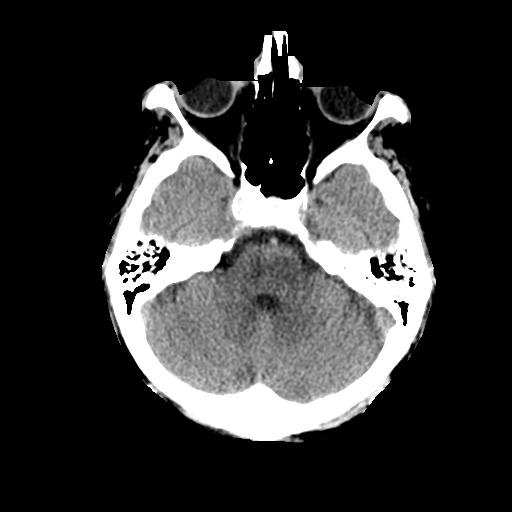
[im 12/32  brain]
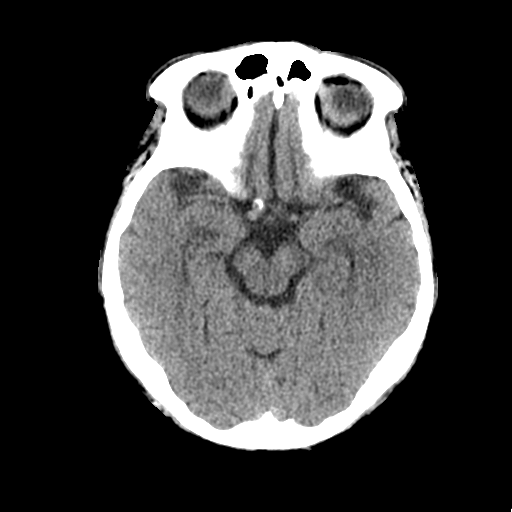
[im 17/32  brain]
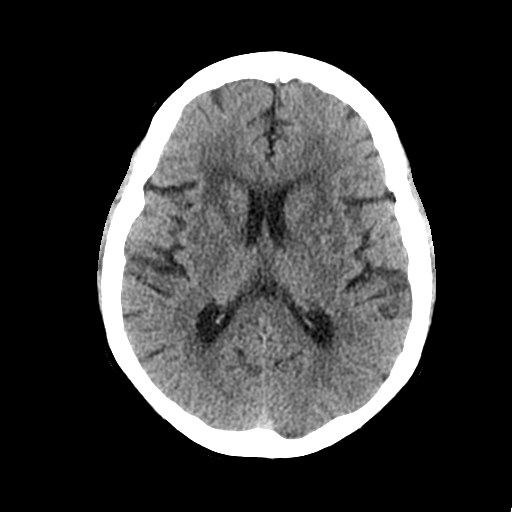
[im 17/32  bone]
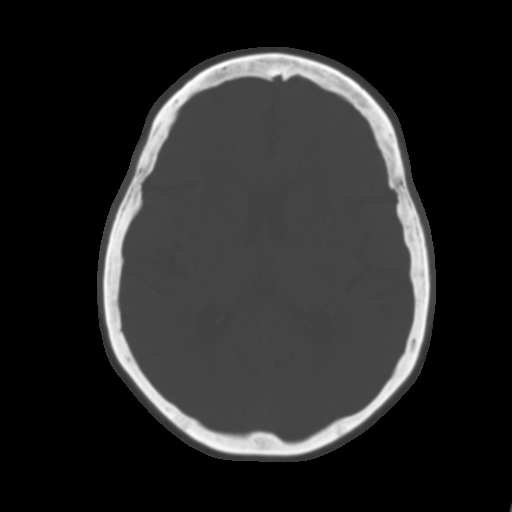
[im 20/32  brain]
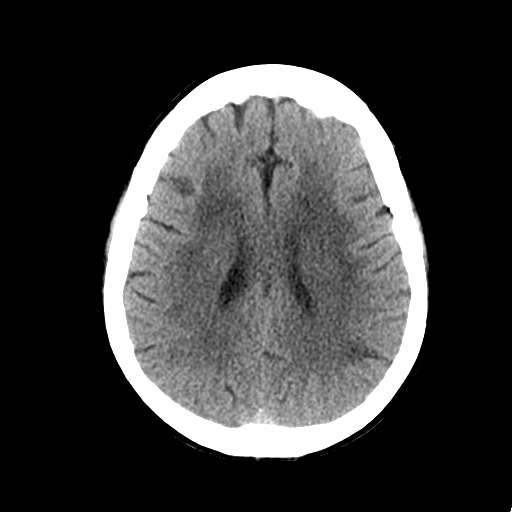
[im 23/32  brain]
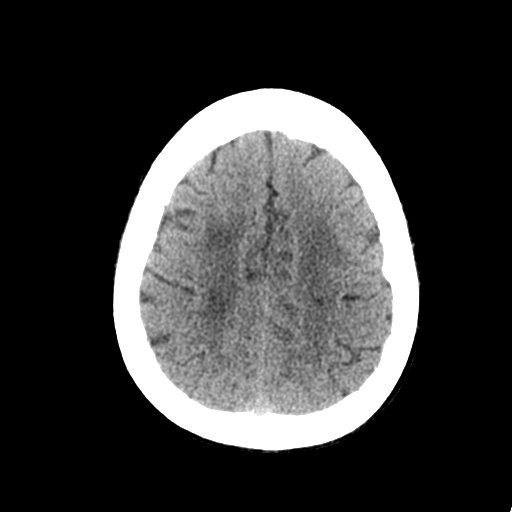
[im 26/32  brain]
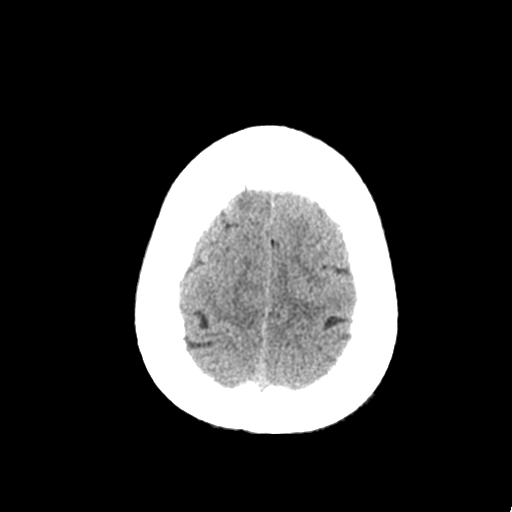
[im 29/32  brain]
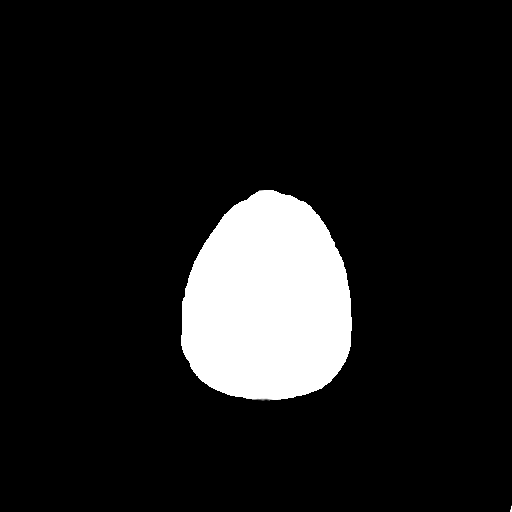
[im 29/32  bone]
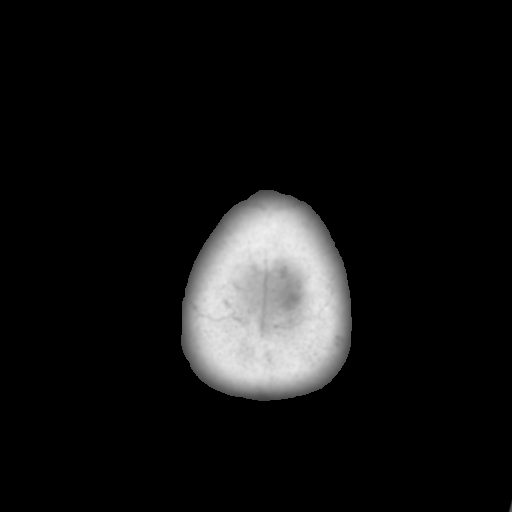

[Series 4: coronal soft tissue · coronal · 0.30mm/px · 3 of 69 slices shown]
[im 23/69  brain]
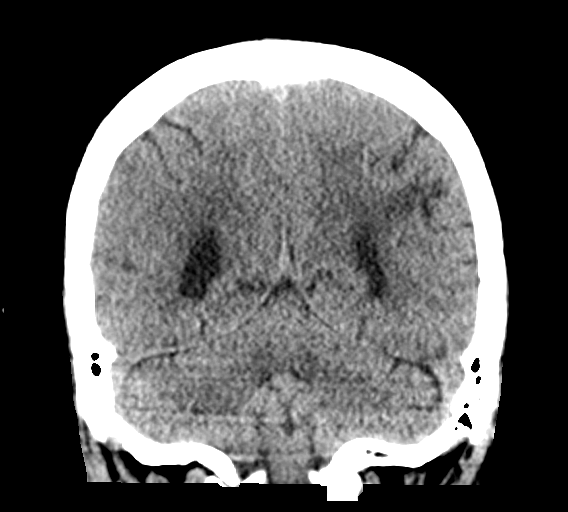
[im 31/69  brain]
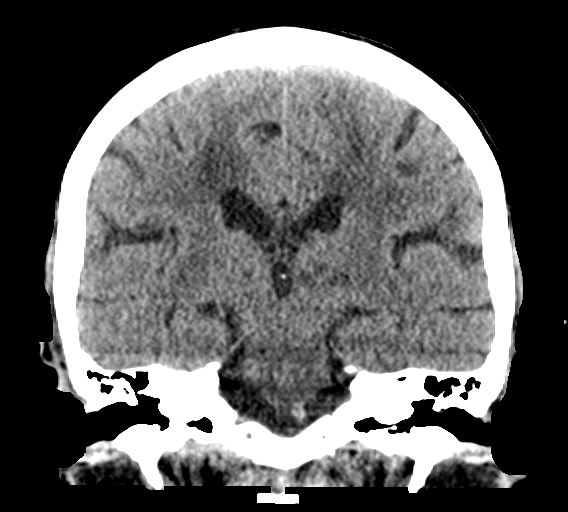
[im 38/69  brain]
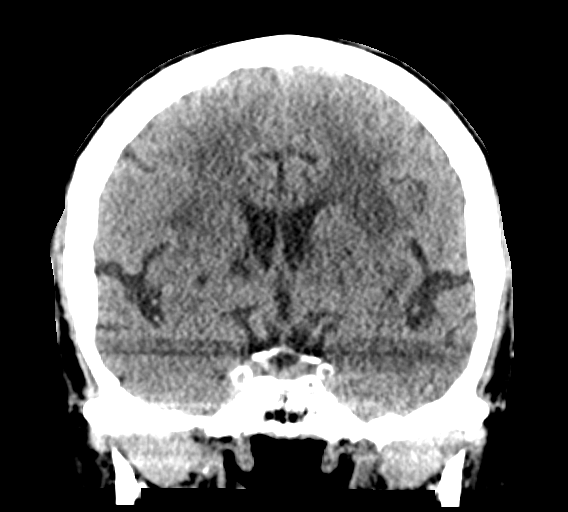

[Series 5: sagittal soft tissue · sagittal · 0.30mm/px · 3 of 58 slices shown]
[im 20/58  brain]
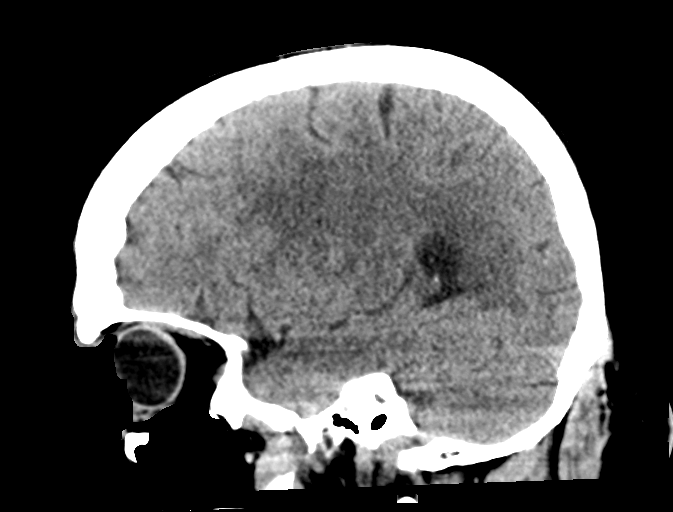
[im 29/58  brain]
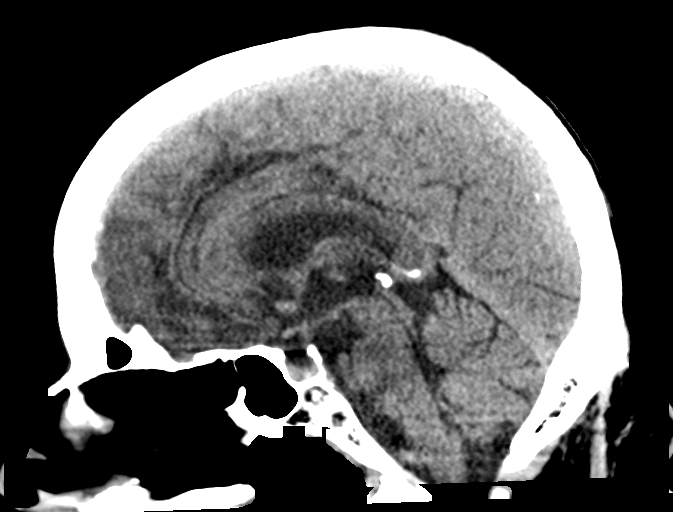
[im 39/58  brain]
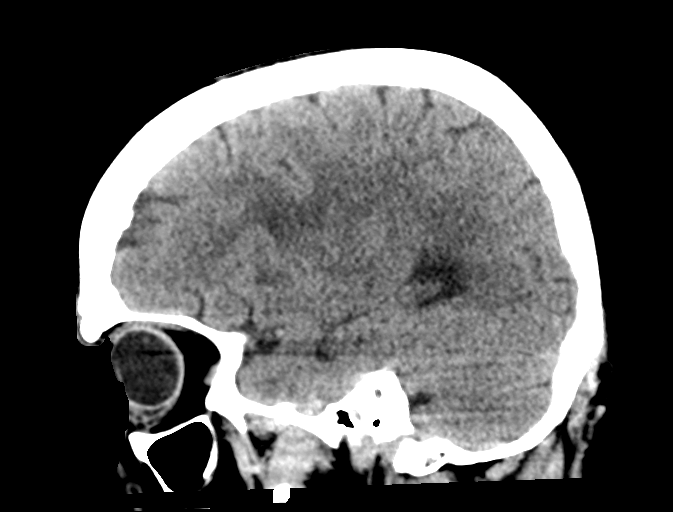

[15 of 47 positions shown; findings below may reference images not displayed]

FINDINGS: Brain: There is mild cerebral atrophy with widening of the
extra-axial spaces and ventricular dilatation.
There are areas of decreased attenuation within the white matter
tracts of the supratentorial brain, consistent with microvascular
disease changes.

Small chronic bilateral basal ganglia lacunar infarcts are seen.

Vascular: No hyperdense vessel or unexpected calcification.

Skull: Normal. Negative for fracture or focal lesion.

Sinuses/Orbits: No acute finding.

Other: None.
IMPRESSION: 1. Generalized cerebral atrophy.
2. Small chronic bilateral basal ganglia lacunar infarcts.
3. No acute intracranial abnormality.

## 2022-11-21 IMAGING — CR DG HIP (WITH OR WITHOUT PELVIS) 2-3V*L*
1 series · 3 of 3 positions shown · non-contrast
Comparison: Abdominopelvic CT 12/05/2020

CLINICAL DATA: Pain.  Recent fall.

EXAM:
DG HIP (WITH OR WITHOUT PELVIS) 2-3V LEFT

[Series 1: dg hip unilat w or w/o pelvis 2-3 views  · non-contrast · 0.14mm/px · 3 of 3 slices shown]
[im 1/3]
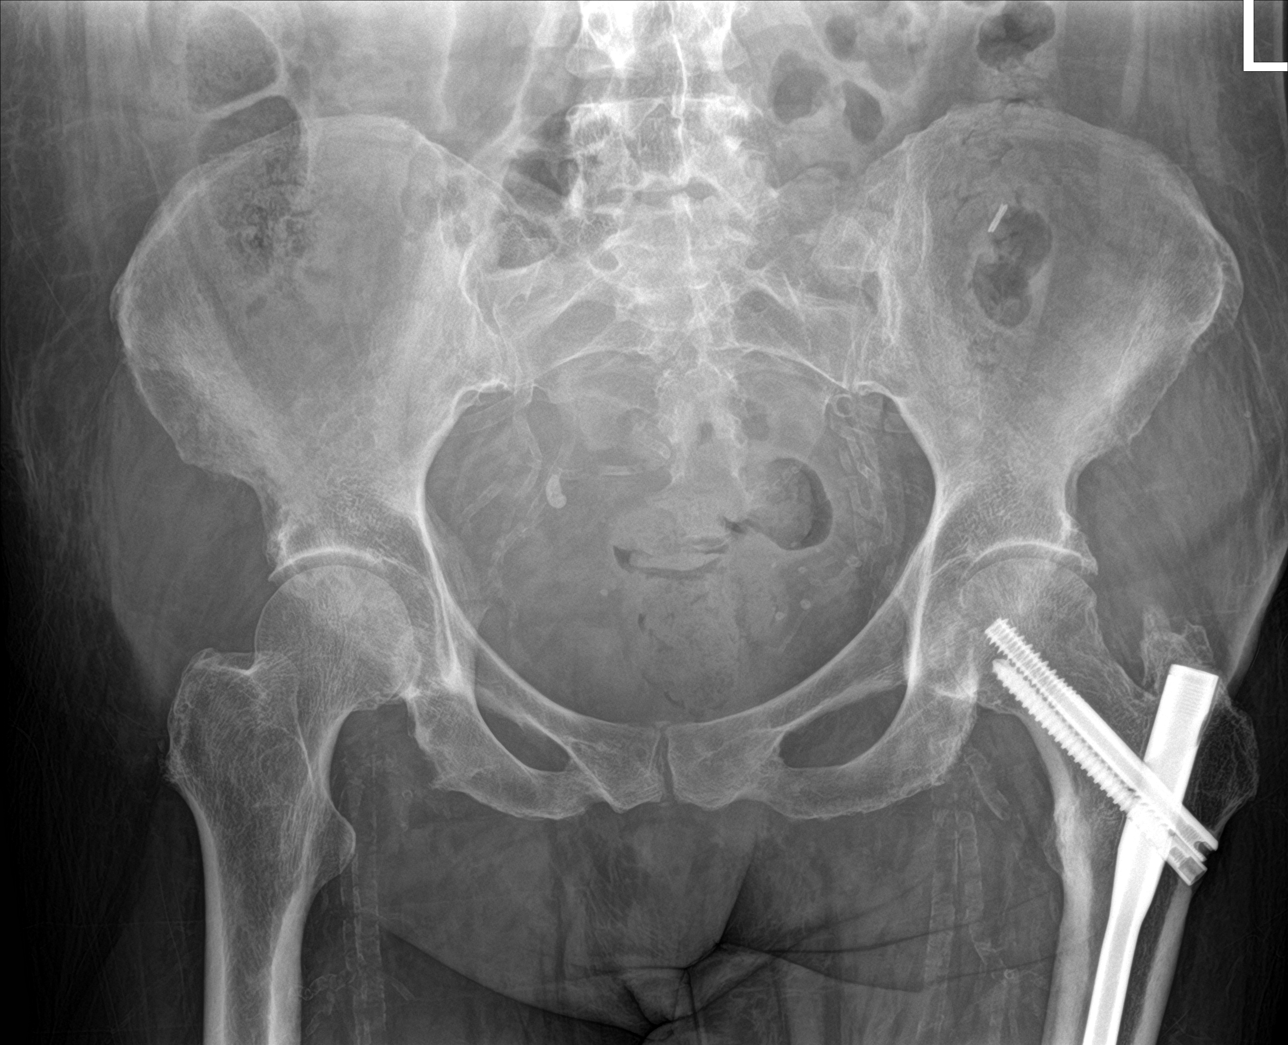
[im 2/3]
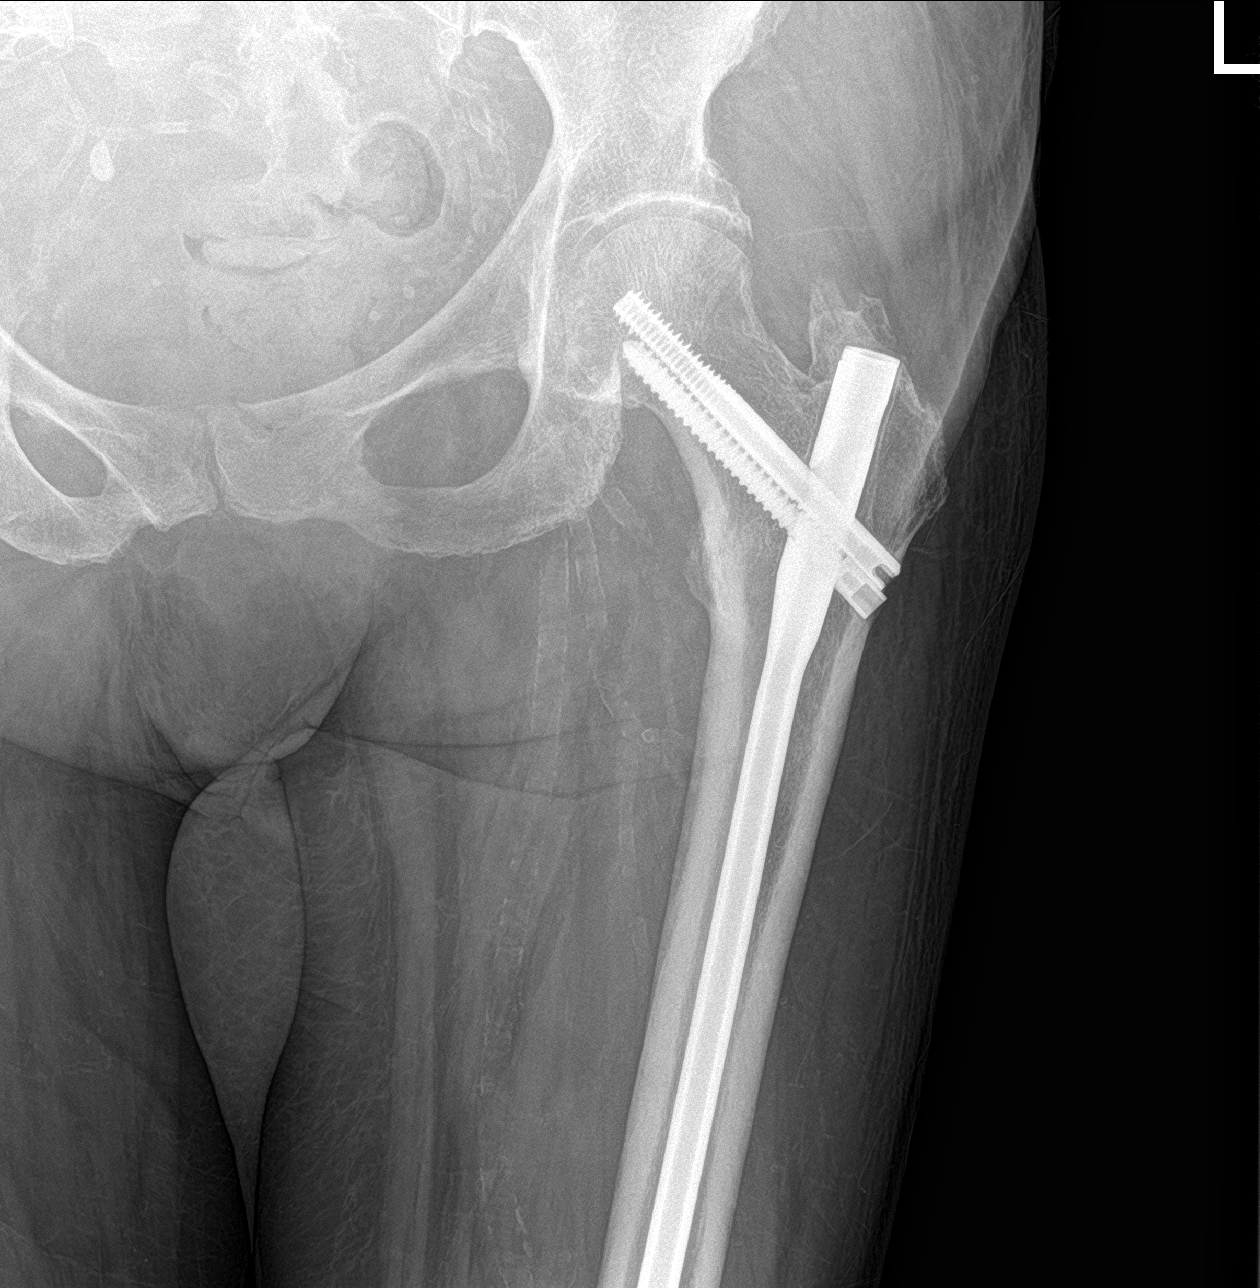
[im 3/3]
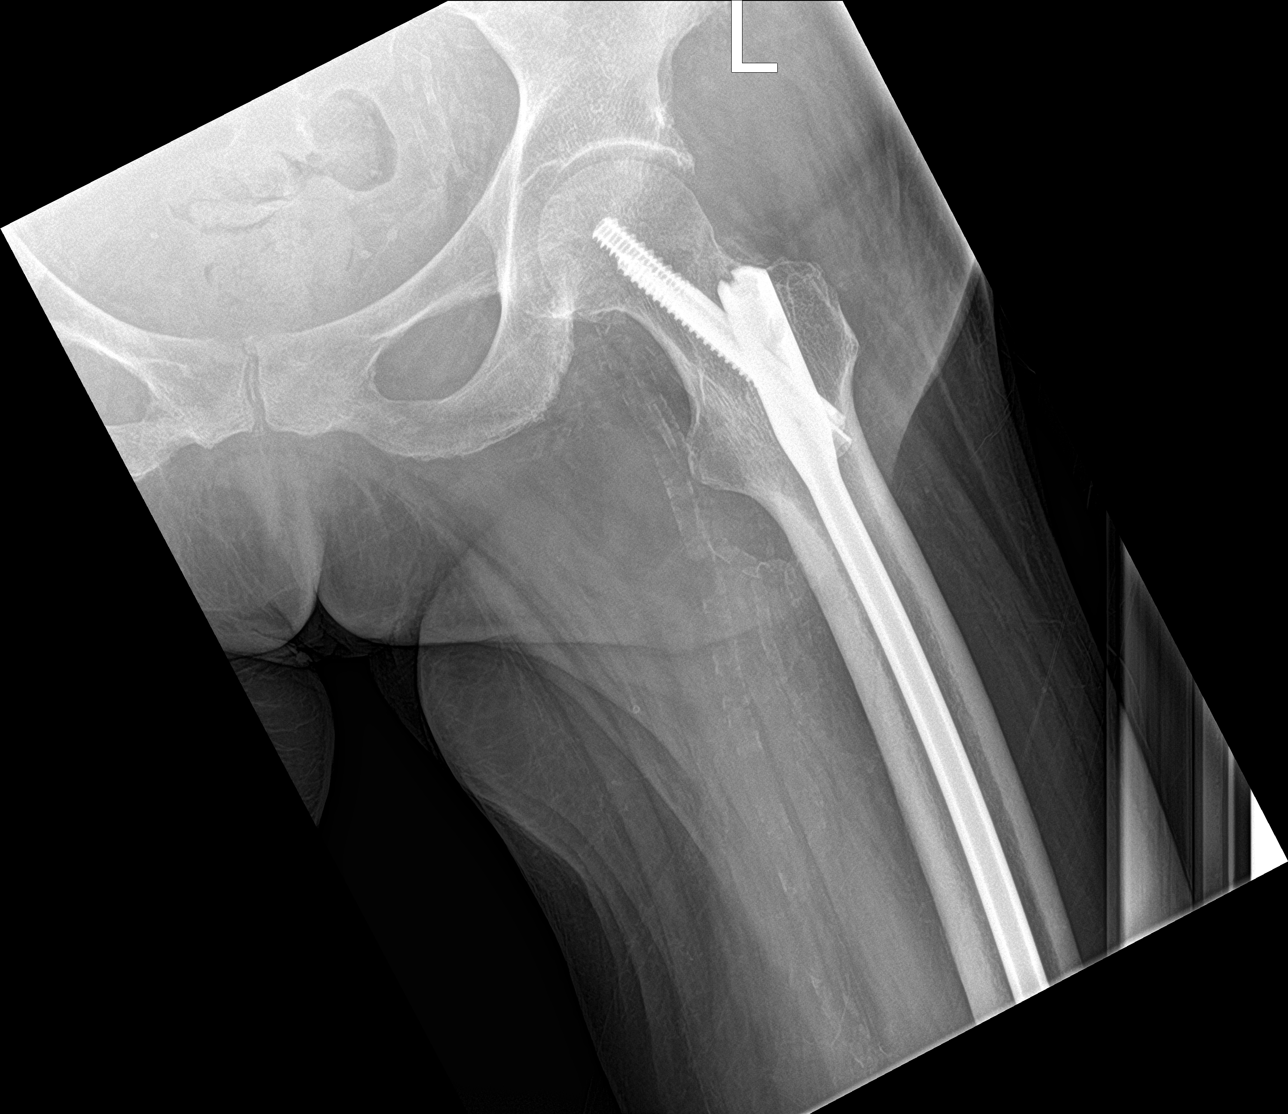

[3 of 3 positions shown; findings below may reference images not displayed]

FINDINGS: Intramedullary nail with trans trochanteric screw fixation of the
proximal left femur. The hardware is intact were visualized. There
is no periprosthetic lucency or fracture. Intact pubic rami. Pubic
symphysis and sacroiliac joints are congruent. The bones are under
mineralized. There is mild bilateral hip osteoarthritis with
acetabular spurring. Age advanced vascular calcifications.
IMPRESSION: 1. No acute fracture of the pelvis or hip.
2. Postsurgical change of the left femur without hardware
complication.

## 2023-01-05 IMAGING — XA DG SPINAL PUNCT LUMBAR DIAG WITH FL CT GUIDANCE
1 series · 1 of 1 positions shown · non-contrast
Comparison: MRI head 12/05/2020

CLINICAL DATA: 50-year-old female with extremity tingling and
numbness. There is clinical concern for possible demyelinating
process. She presents for lumbar puncture to acquire CSF for labs.

EXAM:
DIAGNOSTIC LUMBAR PUNCTURE UNDER FLUOROSCOPIC GUIDANCE

[Series 1: ortho adipose · 1 of 1 slices shown]
[im 1/1]
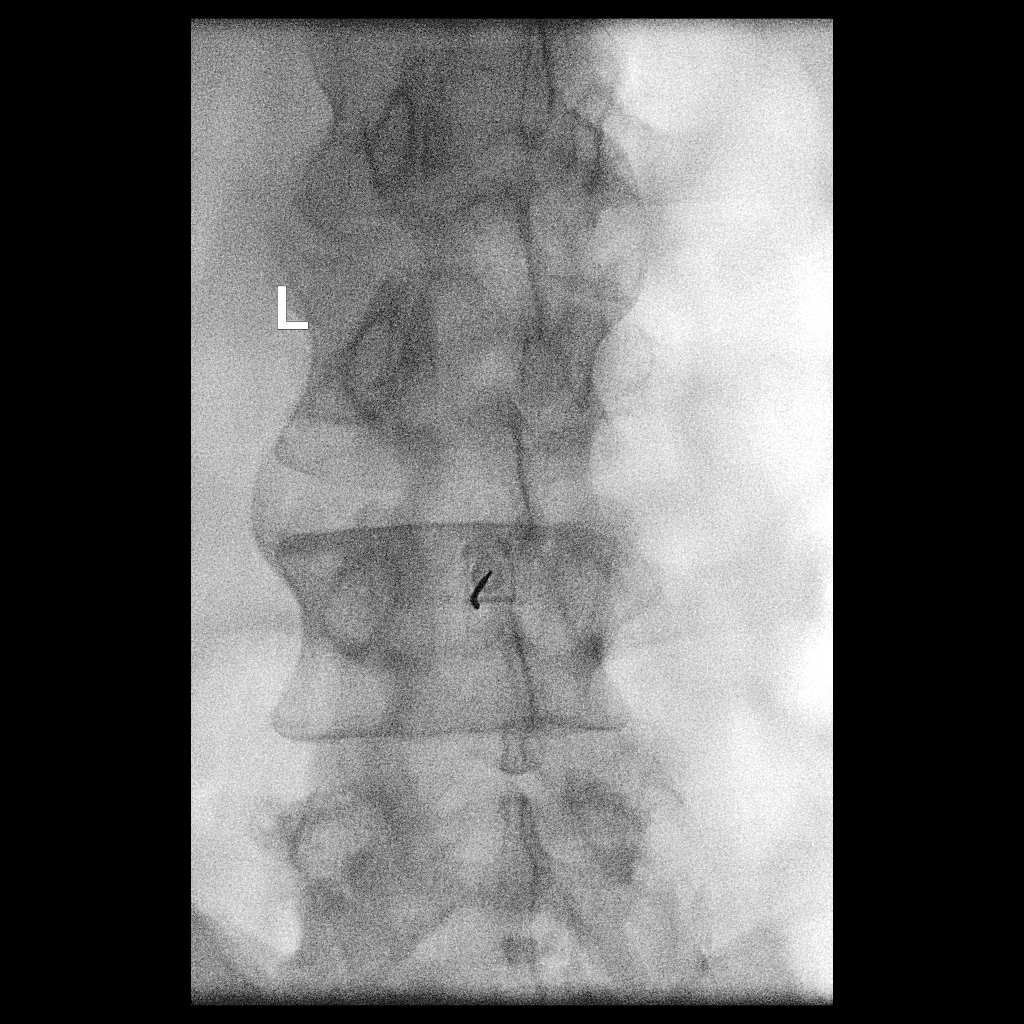

[1 of 1 positions shown; findings below may reference images not displayed]

FLUOROSCOPY TIME:  Fluoroscopy Time:  0 minutes 27 seconds

Radiation Exposure Index (if provided by the fluoroscopic device):
3.6 mGy

Number of Acquired Spot Images: 0

PROCEDURE:
Informed consent was obtained from the patient prior to the
procedure, including potential complications of headache, allergy,
and pain. With the patient prone, the lower back was prepped with
Betadine. 1% Lidocaine was used for local anesthesia. Lumbar
puncture was performed at the L3-L4 level using a 20 gauge needle
with return of clear CSF with an opening pressure of 12 cm water.
Thirteen ml of CSF were obtained for laboratory studies. The patient
tolerated the procedure well and there were no apparent
complications.
IMPRESSION: Technically successful L3-L4 lumbar puncture.

## 2023-01-21 IMAGING — DX DG CHEST 1V PORT
1 series · 1 of 1 positions shown · non-contrast
Comparison: 03/31/2020

CLINICAL DATA: Altered level of consciousness, weakness

EXAM:
PORTABLE CHEST 1 VIEW

[chest ap]
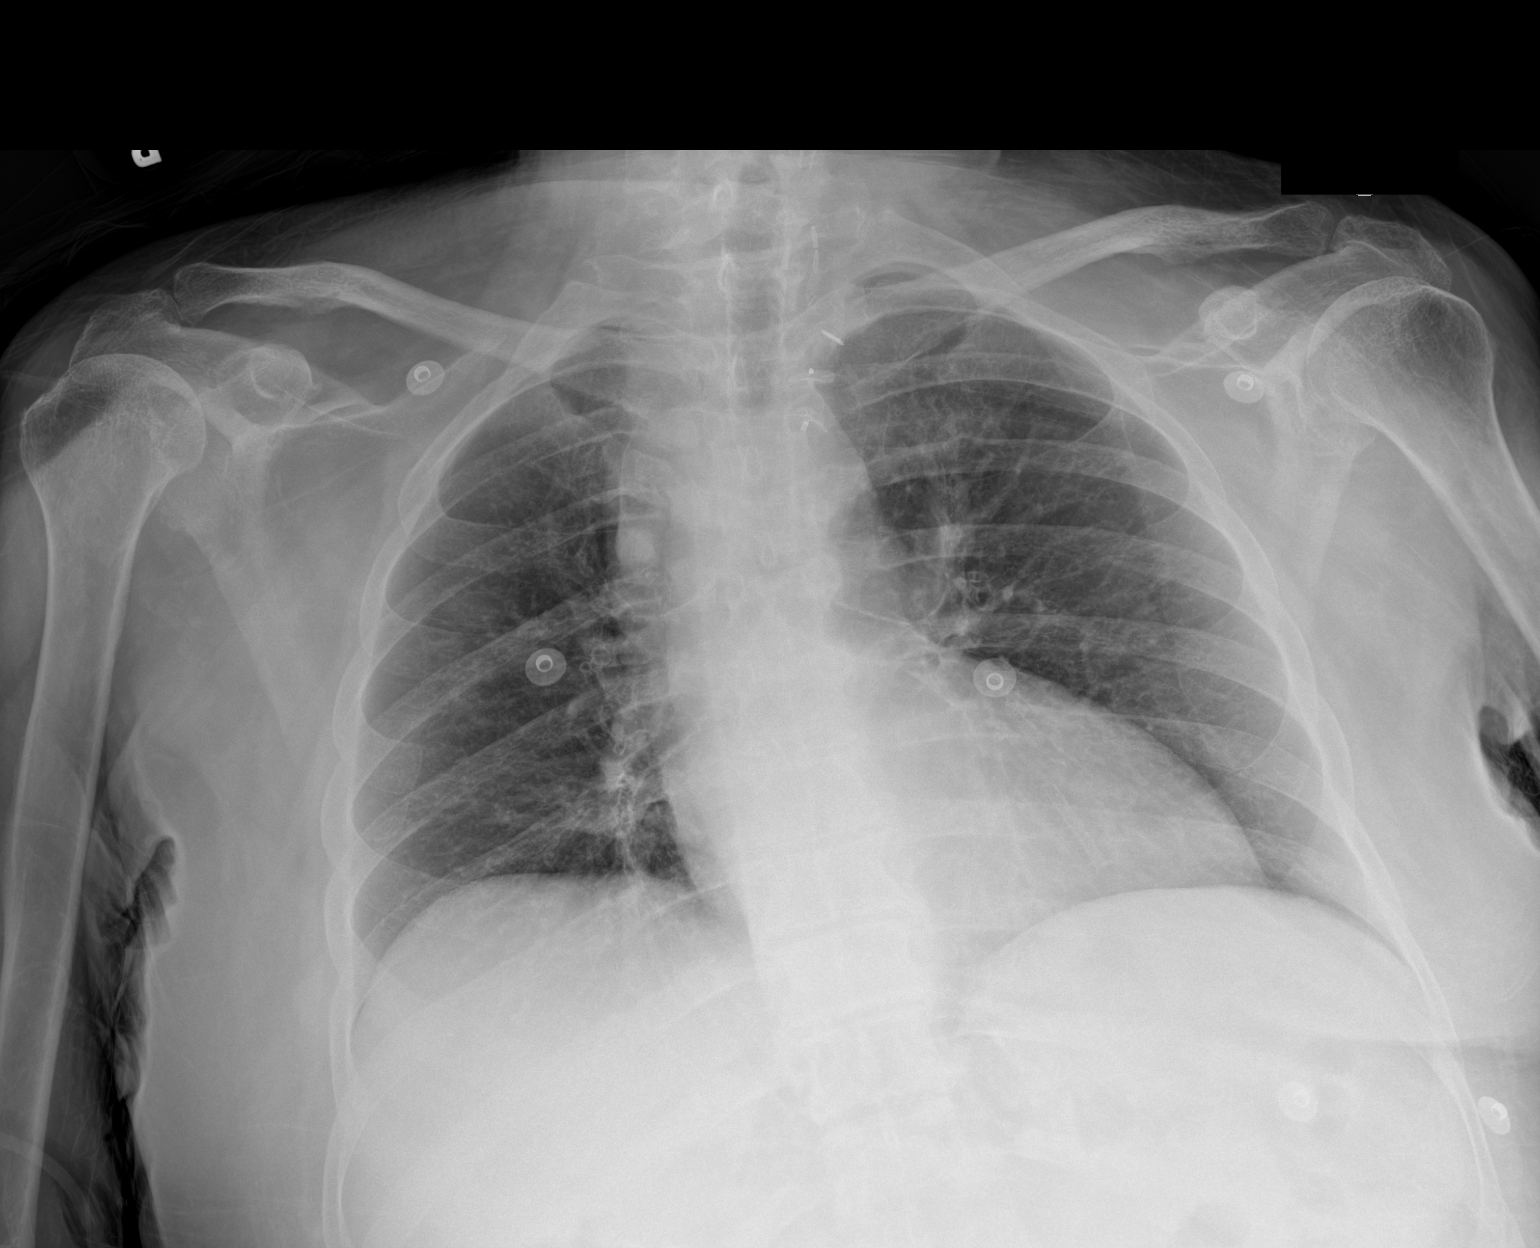

[1 of 1 positions shown; findings below may reference images not displayed]

FINDINGS: Single frontal view of the chest demonstrates an unremarkable
cardiac silhouette. No acute airspace disease, effusion, or
pneumothorax. No acute bony abnormalities.
IMPRESSION: 1. No acute intrathoracic process.

## 2023-01-24 IMAGING — MR MR HEAD W/O CM
6 of 10 series · 29 of 48 positions shown · non-contrast
Comparison: Previous CTA from 02/07/2021 and MRI from 12/05/2020.

CLINICAL DATA: Initial evaluation for acute seizure.

EXAM:
MRI HEAD WITHOUT CONTRAST
TECHNIQUE: Multiplanar, multiecho pulse sequences of the brain and surrounding
structures were obtained without intravenous contrast.

[Series 3: DWI · axial · 3.0mm · 1.09mm/px · z∈[-77,+73]mm · 11 of 102 slices shown (1 of 2)]
[im 1/102]
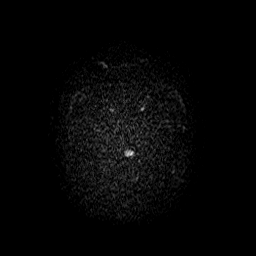
[im 11/102]
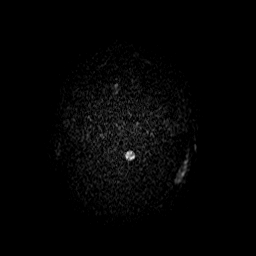
[im 21/102]
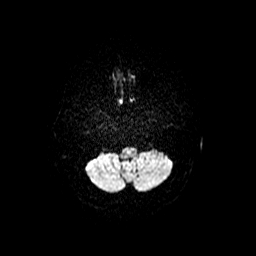
[im 31/102]
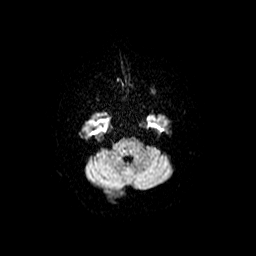
[im 41/102]
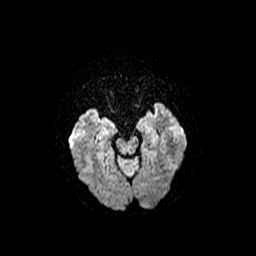
[im 51/102]
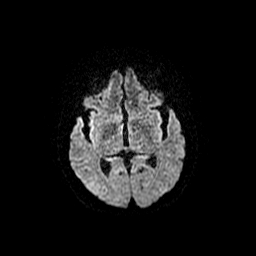
[im 61/102]
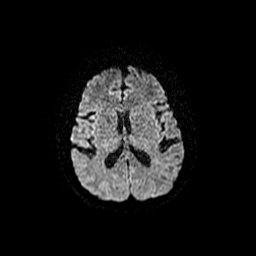
[im 71/102]
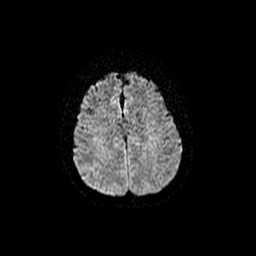
[im 81/102]
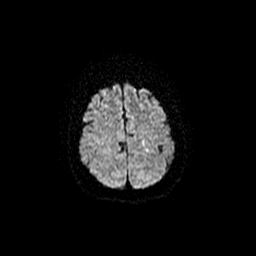
[im 91/102]
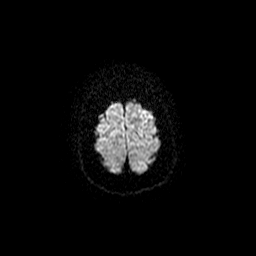
[im 102/102]
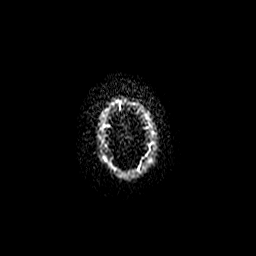

[Series 5: T2 · axial · 5.0mm · 0.45mm/px · z∈[-86,+64]mm · 3 of 26 slices shown (1 of 2)]
[im 1/26]
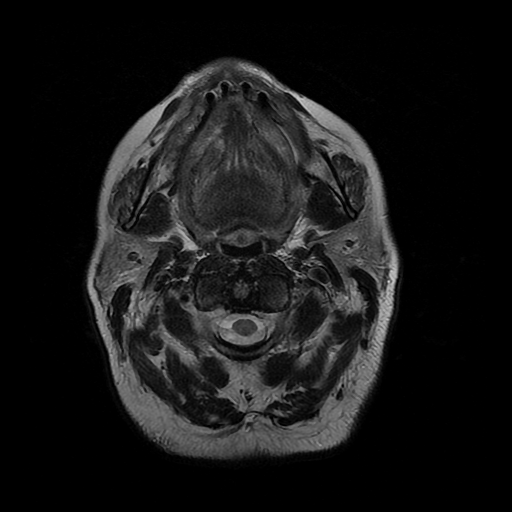
[im 13/26]
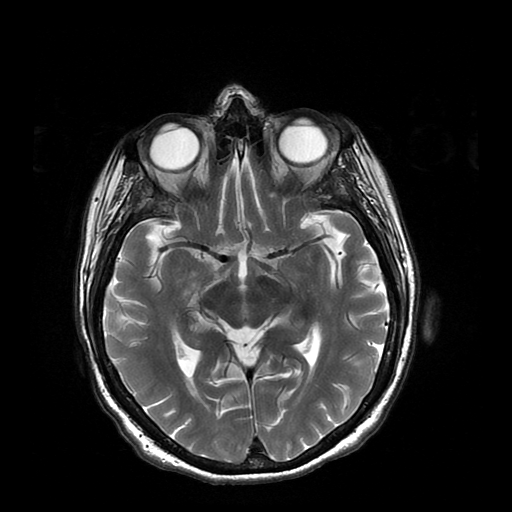
[im 26/26]
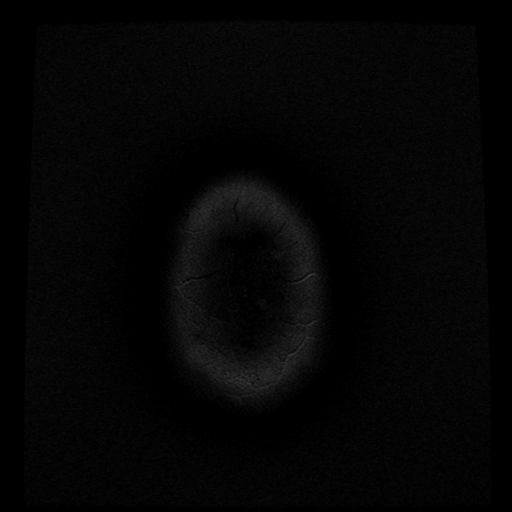

[Series 6: FLAIR · axial · 3.0mm · 0.45mm/px · z∈[-86,+64]mm · 3 of 26 slices shown (1 of 2)]
[im 1/26]
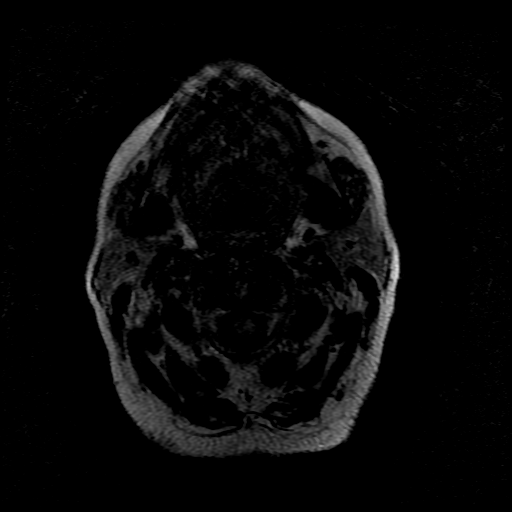
[im 13/26]
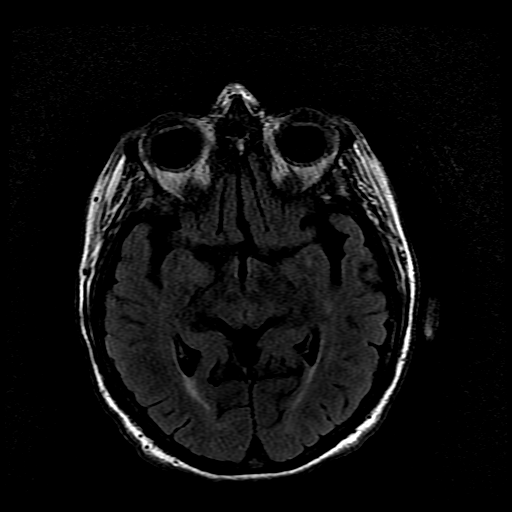
[im 26/26]
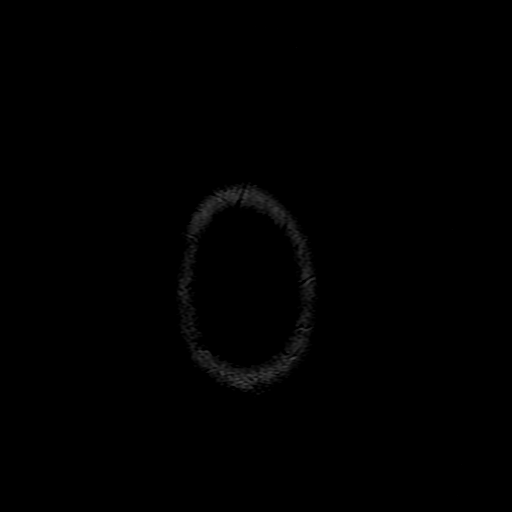

[Series 8: T2 · coronal · 5.0mm · 0.39mm/px · 3 of 26 slices shown (2 of 2)]
[im 1/26]
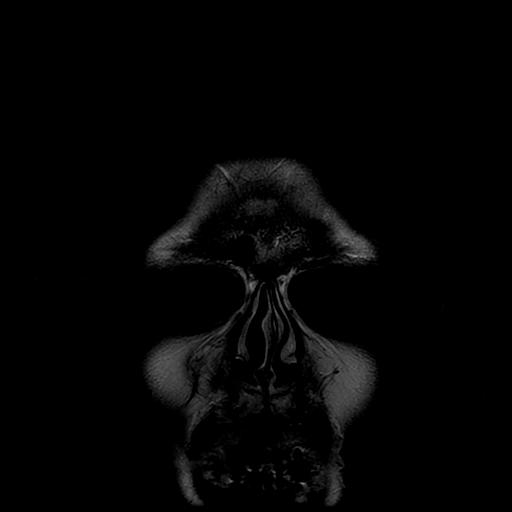
[im 13/26]
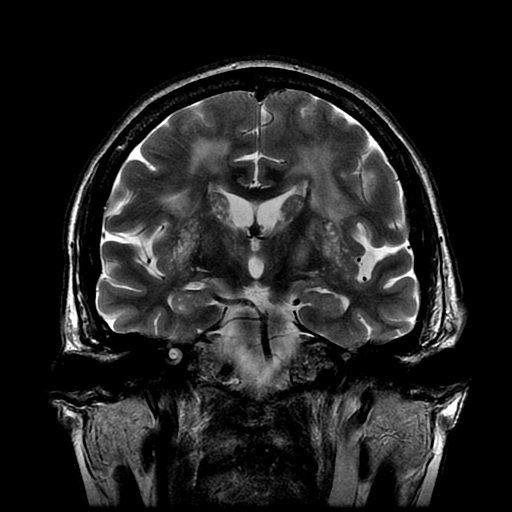
[im 26/26]
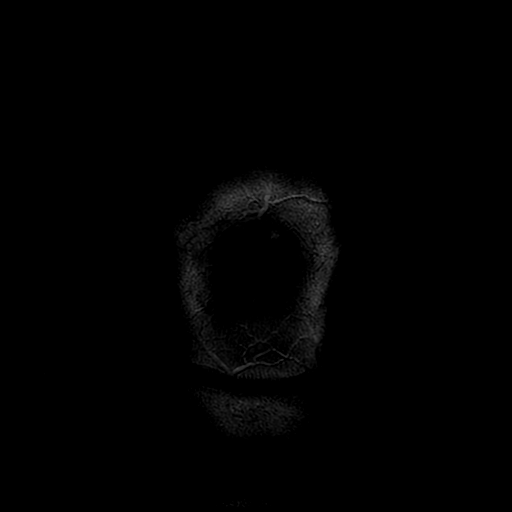

[Series 11: FLAIR · coronal · 3.0mm · 0.39mm/px · 4 of 35 slices shown (2 of 2)]
[im 1/35]
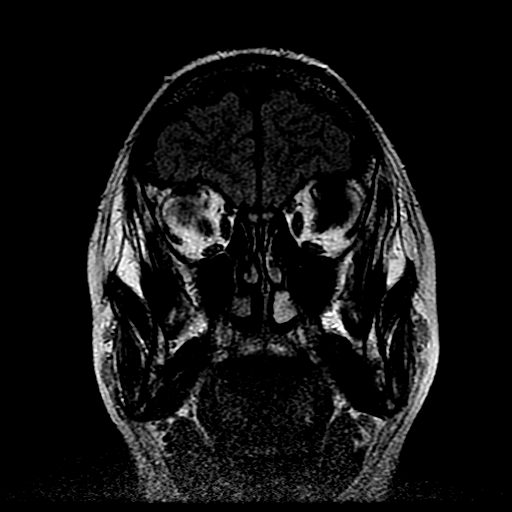
[im 12/35]
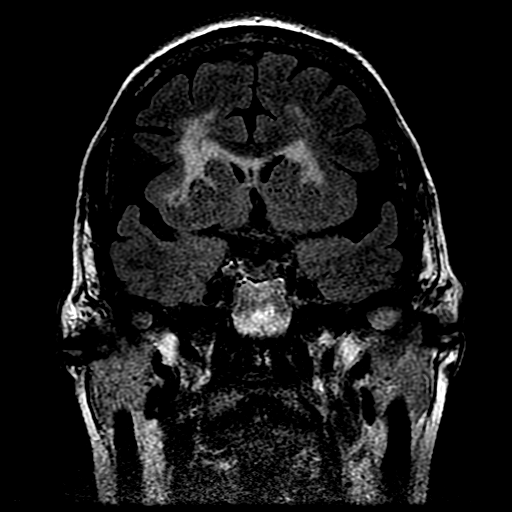
[im 23/35]
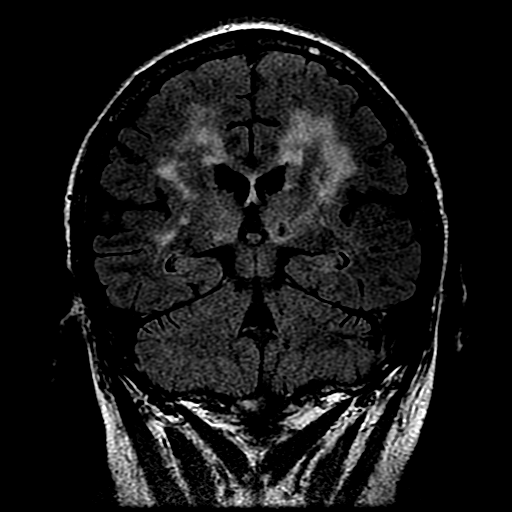
[im 35/35]
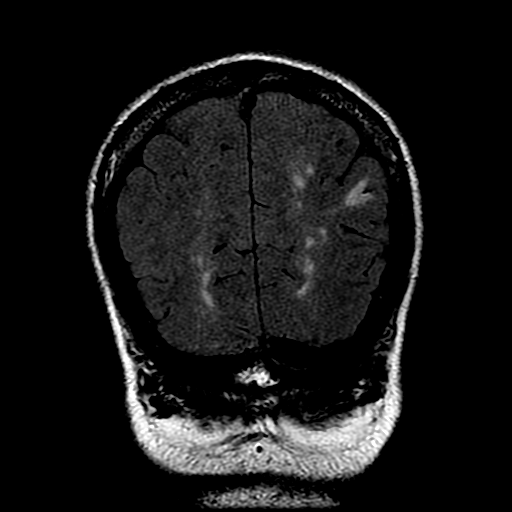

[Series 300: DWI · axial · 3.0mm · 1.09mm/px · z∈[-77,+73]mm · 5 of 51 slices shown (2 of 2)]
[im 1/51]
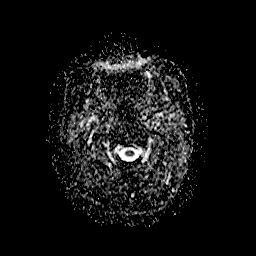
[im 13/51]
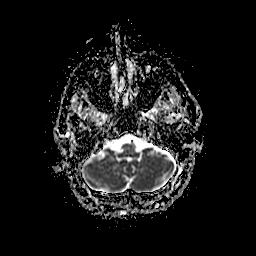
[im 26/51]
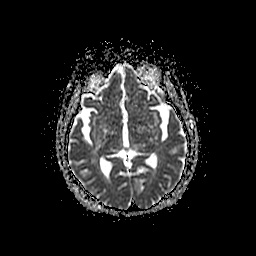
[im 38/51]
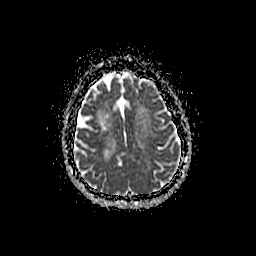
[im 51/51]
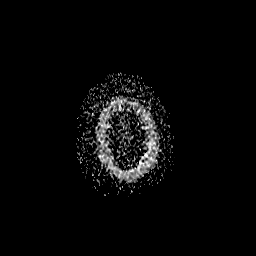

[29 of 48 positions shown; findings below may reference images not displayed]

FINDINGS: Brain: Mildly advanced cerebral atrophy for age. Extensive patchy
and confluent T2/FLAIR hyperintensity involving the periventricular
and deep white matter both cerebral hemispheres as well as the pons,
most consistent with chronic microvascular ischemic disease, fairly
advanced in nature. Multiple scattered superimposed remote lacunar
infarcts present about the hemispheric cerebral white matter, basal
ganglia, thalami, and pons. Remote lacunar infarct present at the
right middle cerebellar peduncle. Small remote cortical to
subcortical infarct noted at the left parieto-occipital region.

Subtle 1.6 cm focus of curvilinear diffusion abnormality involving
the deep white matter of the posterior left frontal centrum semi
ovale (series 3, image 41). No visible ADC correlate. Finding
suspected to reflect a small focus of evolving subacute small vessel
ischemic change, although this could potentially reflect T2 shine
through. Otherwise, no other diffusion abnormality to suggest acute
or subacute ischemia or changes related to seizure. Gray-white
matter differentiation otherwise maintained. No other areas of
chronic cortical infarction. No acute intracranial hemorrhage.
Single probable punctate focus of chronic microhemorrhage noted at
the right temporal region, of doubtful significance in isolation.

No mass lesion, midline shift or mass effect. No hydrocephalus or
extra-axial fluid collection. Pituitary gland suprasellar region
normal. Midline structures intact. No intrinsic temporal lobe
abnormality.

Vascular: Major intracranial vascular flow voids are maintained.

Skull and upper cervical spine: Craniocervical junction within
normal limits. Bone marrow signal intensity normal. No scalp soft
tissue abnormality.

Sinuses/Orbits: Patient status post bilateral ocular lens
replacement. Globes and orbital soft tissues demonstrate no acute
finding. Paranasal sinuses are largely clear. No mastoid effusion.
Inner ear structures grossly normal.

Other: None.
IMPRESSION: 1. 1.6 cm focus of curvilinear diffusion abnormality involving the
deep white matter of the posterior left frontal centrum semi ovale
as above. While this could potentially reflect a small focus of
artifact/T2 shine through, a possible evolving subacute small vessel
ischemic infarct could be considered in the correct clinical
setting.
2. No other acute intracranial abnormality.
3. Age-related cerebral atrophy with advanced chronic microvascular
ischemic disease, with multiple remote lacunar infarcts as above.

## 2023-02-19 IMAGING — CT CT HEAD W/O CM
4 series · 16 of 47 positions shown, 18 images · non-contrast
Comparison: 02/10/2021 MR, 12/05/[address] head CT and prior studies

CLINICAL DATA: 50-year-old female with altered mental status.
History of demyelinating disease and prior CVAs.



[Series 2: head wo · axial · 0.43mm/px · z∈[-318,-198]mm · 7 of 32 slices shown, 9 images]
[im 4/32  brain]
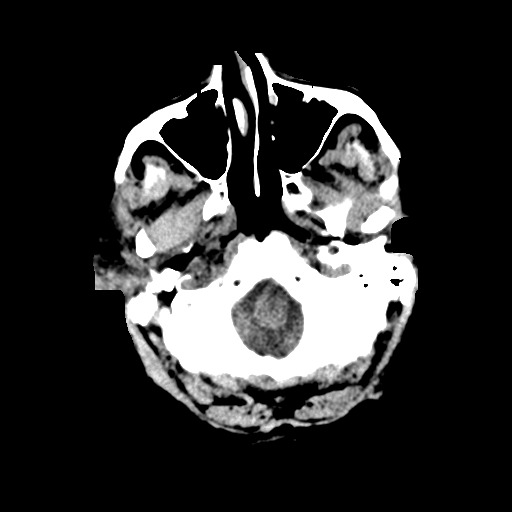
[im 4/32  bone]
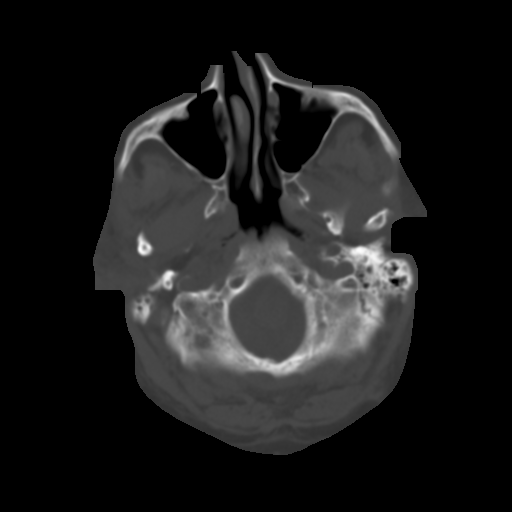
[im 8/32  brain]
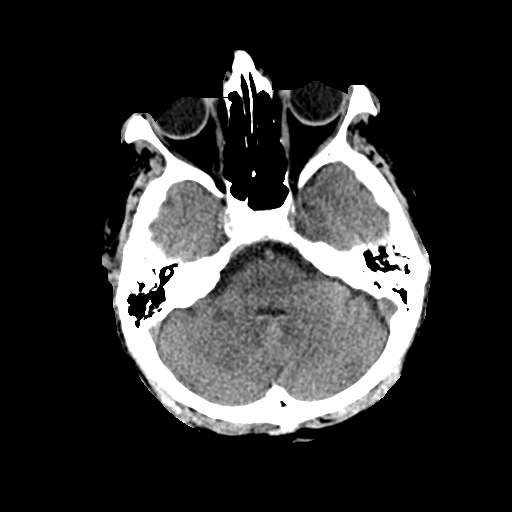
[im 12/32  brain]
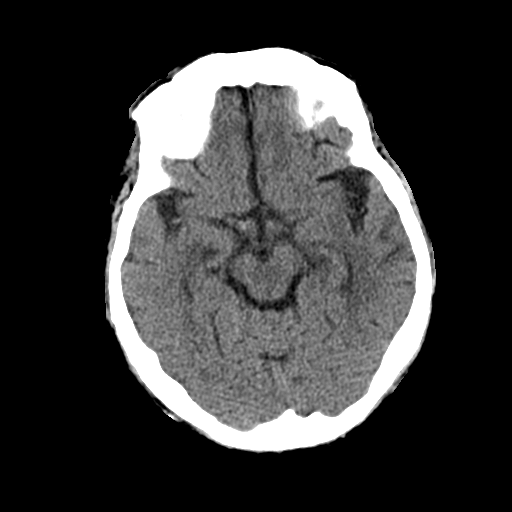
[im 16/32  brain]
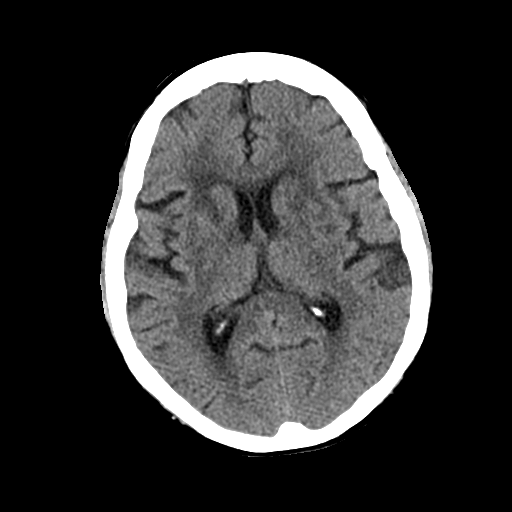
[im 20/32  brain]
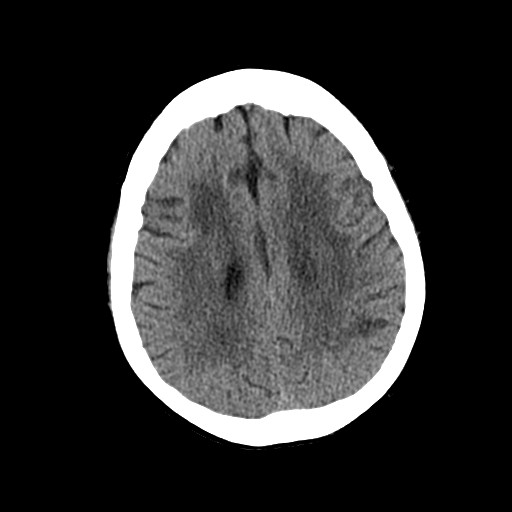
[im 20/32  bone]
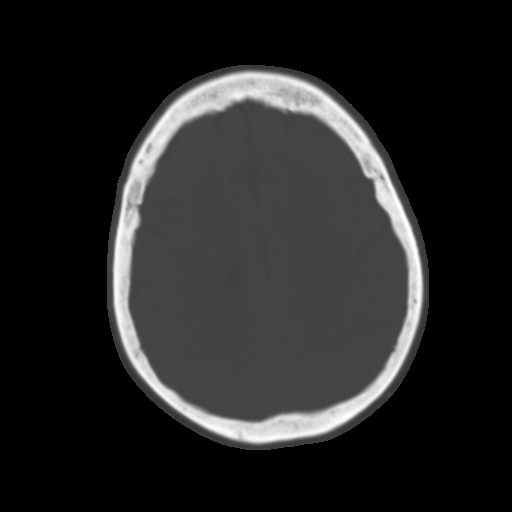
[im 24/32  brain]
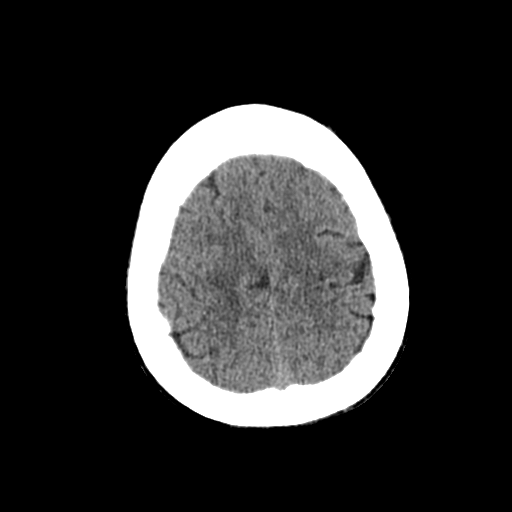
[im 28/32  brain]
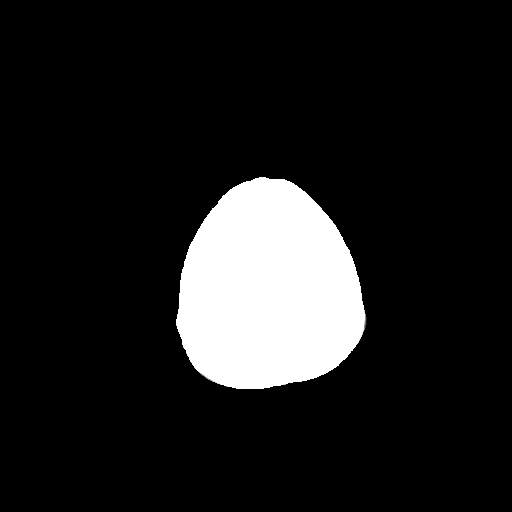

[Series 3: head bone · axial · 0.43mm/px · z∈[-319,-287]mm · 3 of 80 slices shown]
[im 8/80  bone]
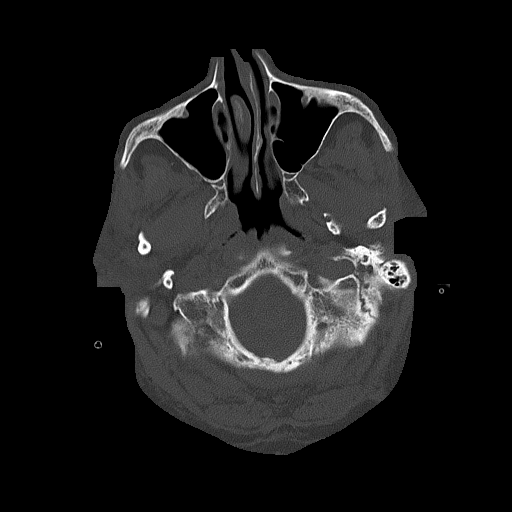
[im 16/80  bone]
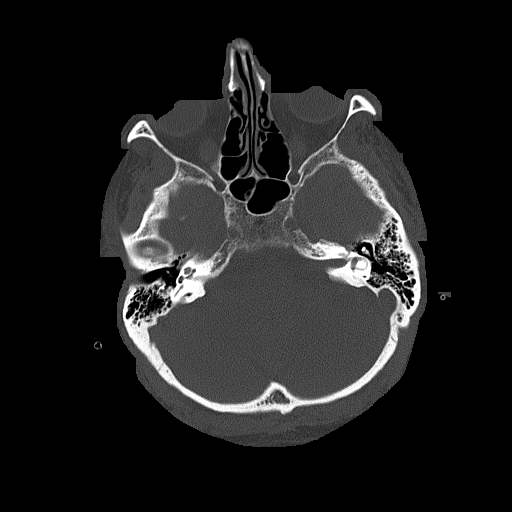
[im 24/80  bone]
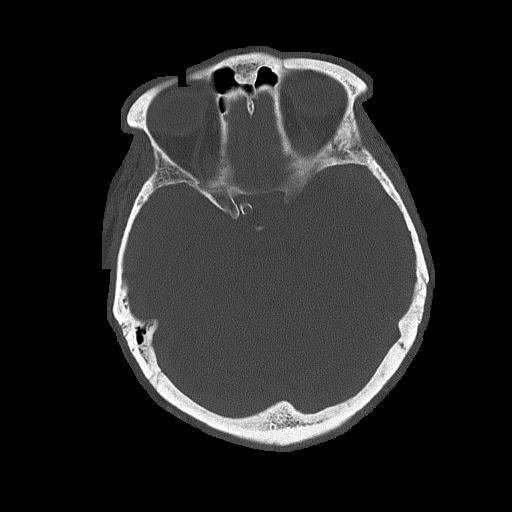

[Series 4: coronal soft tissue · coronal · 0.31mm/px · 3 of 68 slices shown]
[im 23/68  brain]
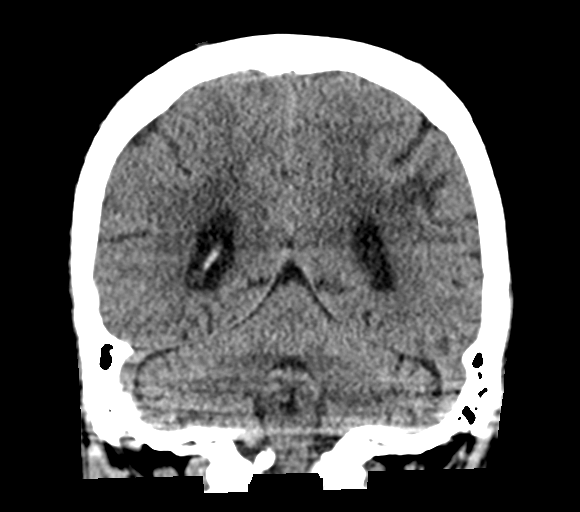
[im 30/68  brain]
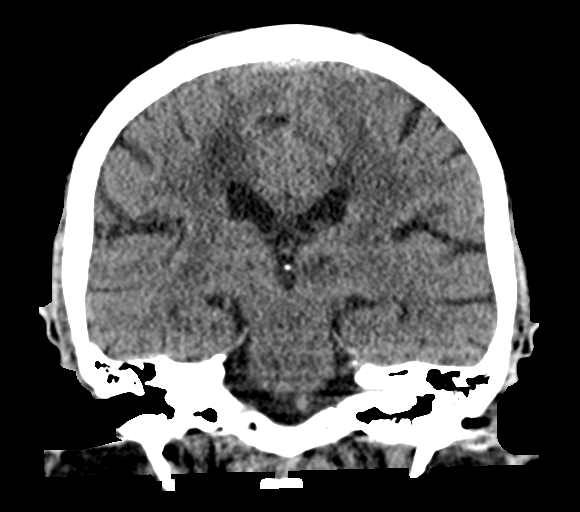
[im 38/68  brain]
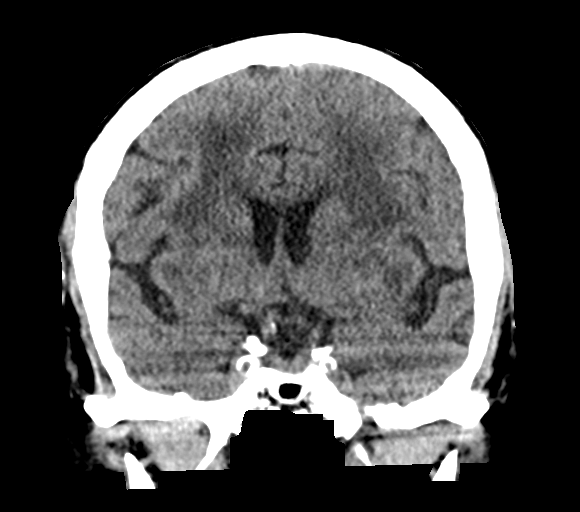

[Series 5: sagittal soft tissue · sagittal · 0.32mm/px · 3 of 58 slices shown]
[im 20/58  brain]
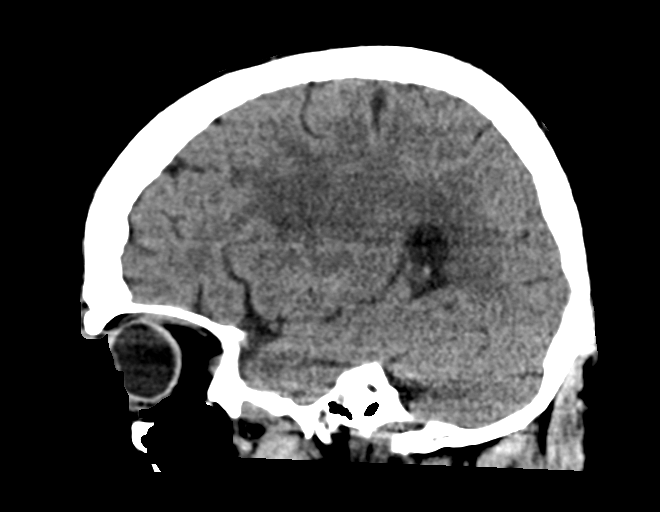
[im 29/58  brain]
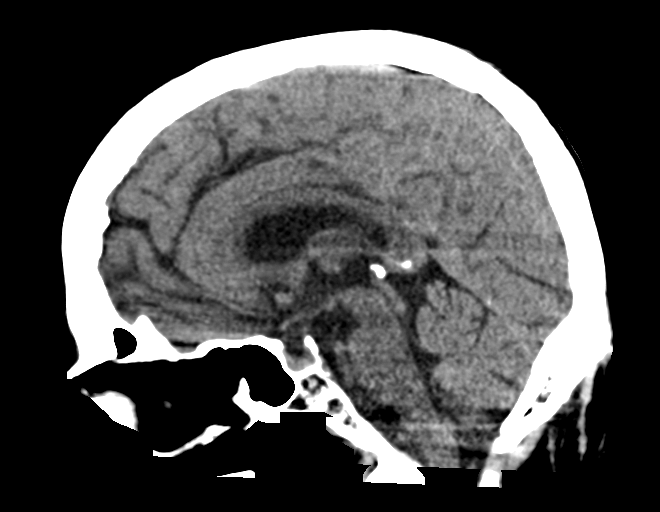
[im 39/58  brain]
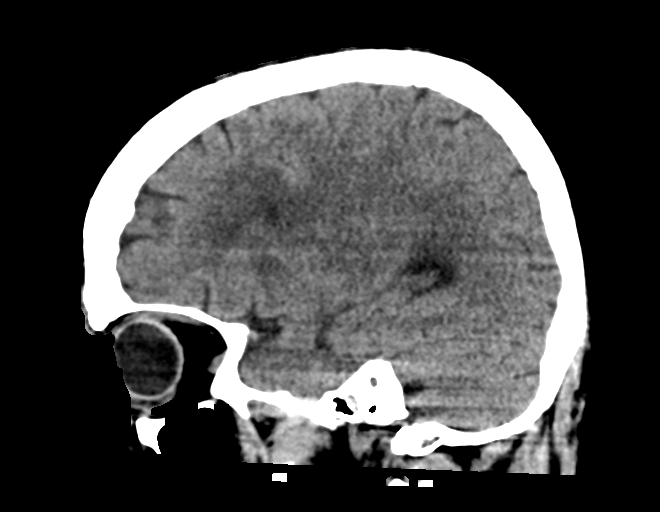

[16 of 47 positions shown; findings below may reference images not displayed]

FINDINGS: Brain: No evidence of acute infarction, hemorrhage, hydrocephalus,
extra-axial collection or mass lesion/mass effect.

Chronic white matter disease and small bilateral remote infarcts are
unchanged. Mild cerebral volume loss is noted.

Vascular: Carotid and vertebral atherosclerotic calcifications are
noted.

Skull: Normal. Negative for fracture or focal lesion.

Sinuses/Orbits: No acute finding.

Other: None.
IMPRESSION: 1. No evidence of acute intracranial abnormality.
2. Chronic white matter disease and small remote bilateral infarcts.

## 2023-02-19 IMAGING — DX DG CHEST 1V PORT
1 series · 1 of 1 positions shown · non-contrast
Comparison: Chest x-rays dated 02/07/2021 and 03/31/2020.

CLINICAL DATA: Altered mental status, seizure.

EXAM:
PORTABLE CHEST 1 VIEW

[chest ap]
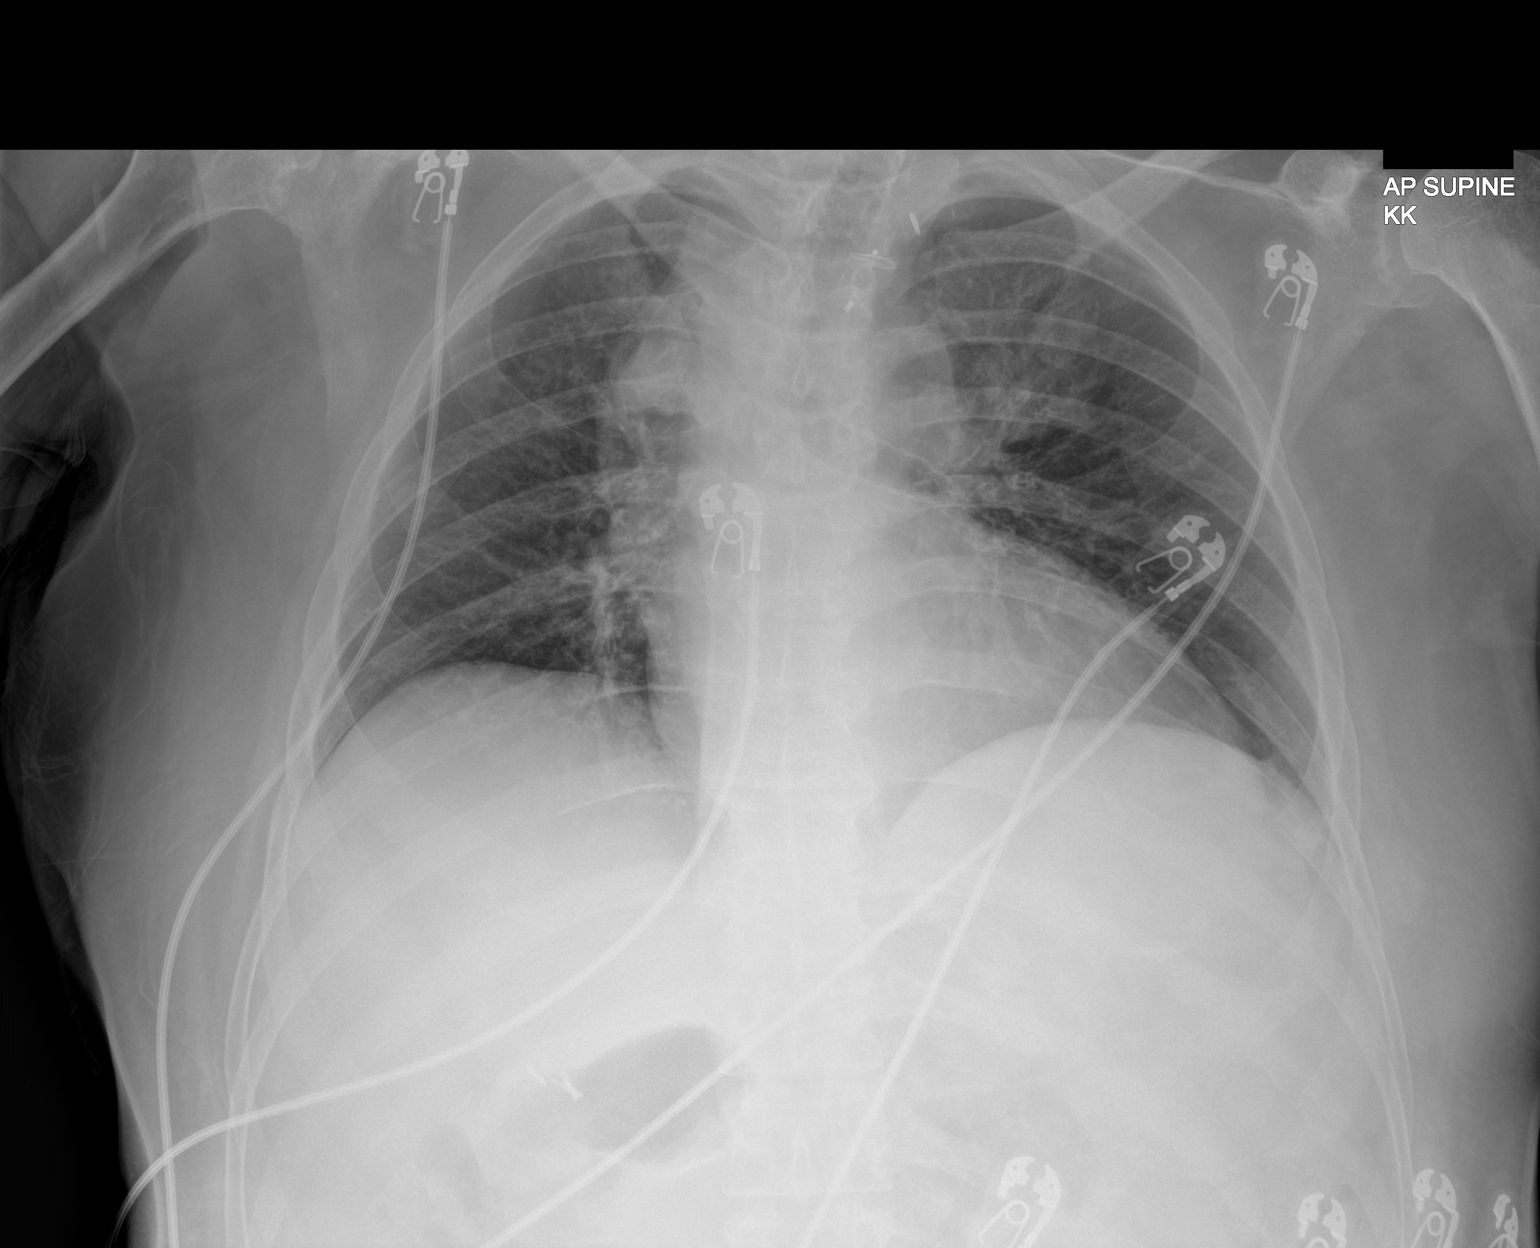

[1 of 1 positions shown; findings below may reference images not displayed]

FINDINGS: Study is hypoinspiratory with crowding of the bilateral perihilar
bronchovascular markings. Given the low lung volumes, heart size and
mediastinal contours are within normal limits and lungs appear
clear. Chronic bronchitic changes noted centrally. Osseous
structures about the chest are unremarkable.
IMPRESSION: Low lung volumes. No active disease. No evidence of pneumonia or
pulmonary edema.

## 2023-02-20 IMAGING — DX DG ABDOMEN 1V
1 series · 1 of 1 positions shown · non-contrast
Comparison: 12/05/2020

CLINICAL DATA: NG tube placement

EXAM:
ABDOMEN - 1 VIEW

[abdomen supine]
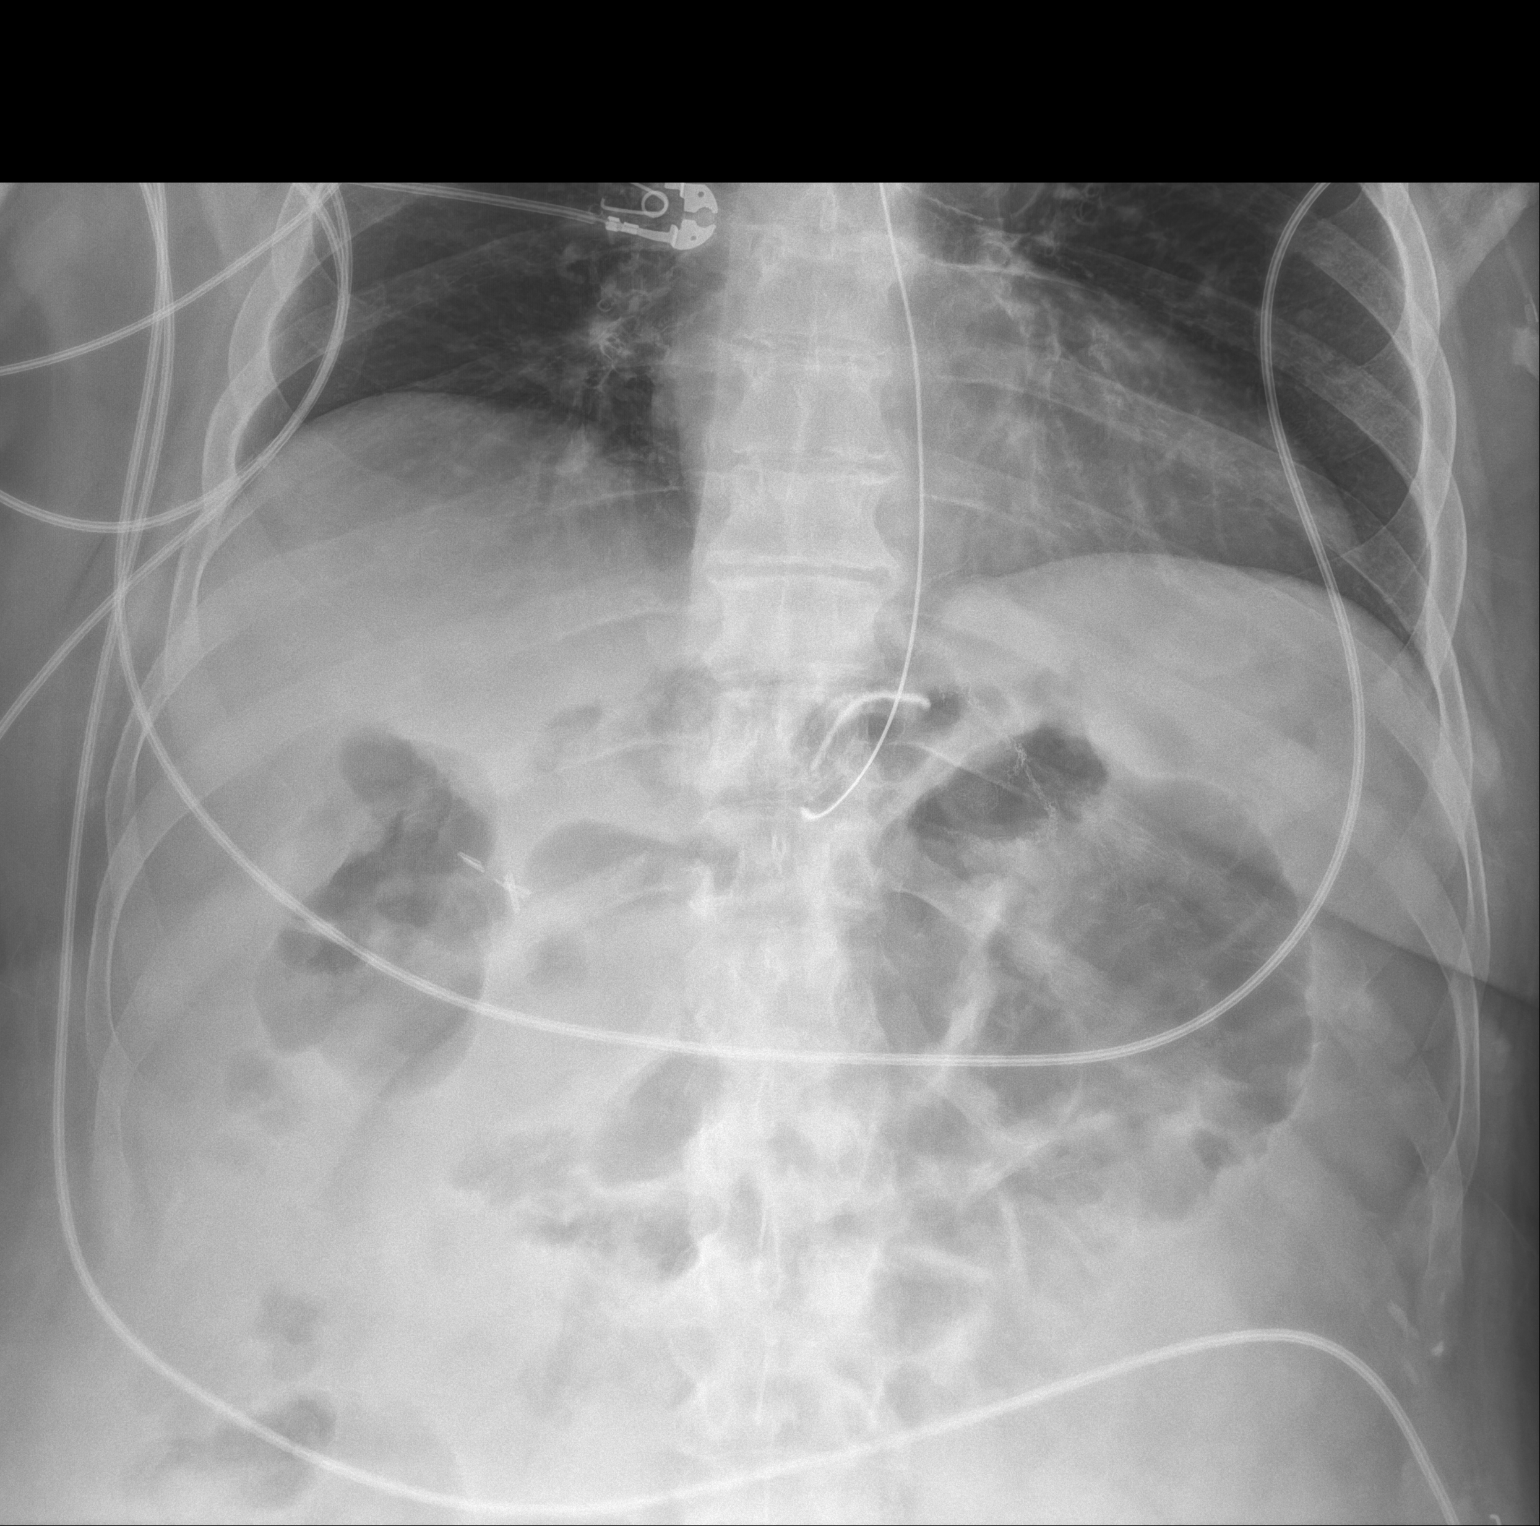

[1 of 1 positions shown; findings below may reference images not displayed]

FINDINGS: Nasogastric tube coiled in the gastric remanent. Gaseous distension
of the small bowel. No evidence of pneumoperitoneum, portal venous
gas or pneumatosis.

No pathologic calcifications along the expected course of the
ureters.

No acute osseous abnormality.
IMPRESSION: 1. Nasogastric tube coiled in the gastric remanent.

## 2023-02-21 IMAGING — DX DG ABD PORTABLE 1V
1 series · 1 of 1 positions shown · non-contrast
Comparison: Previous studies including the examination of
03/09/2021

CLINICAL DATA: Feeding tube placement

EXAM:
PORTABLE ABDOMEN - 1 VIEW

[abdomen]
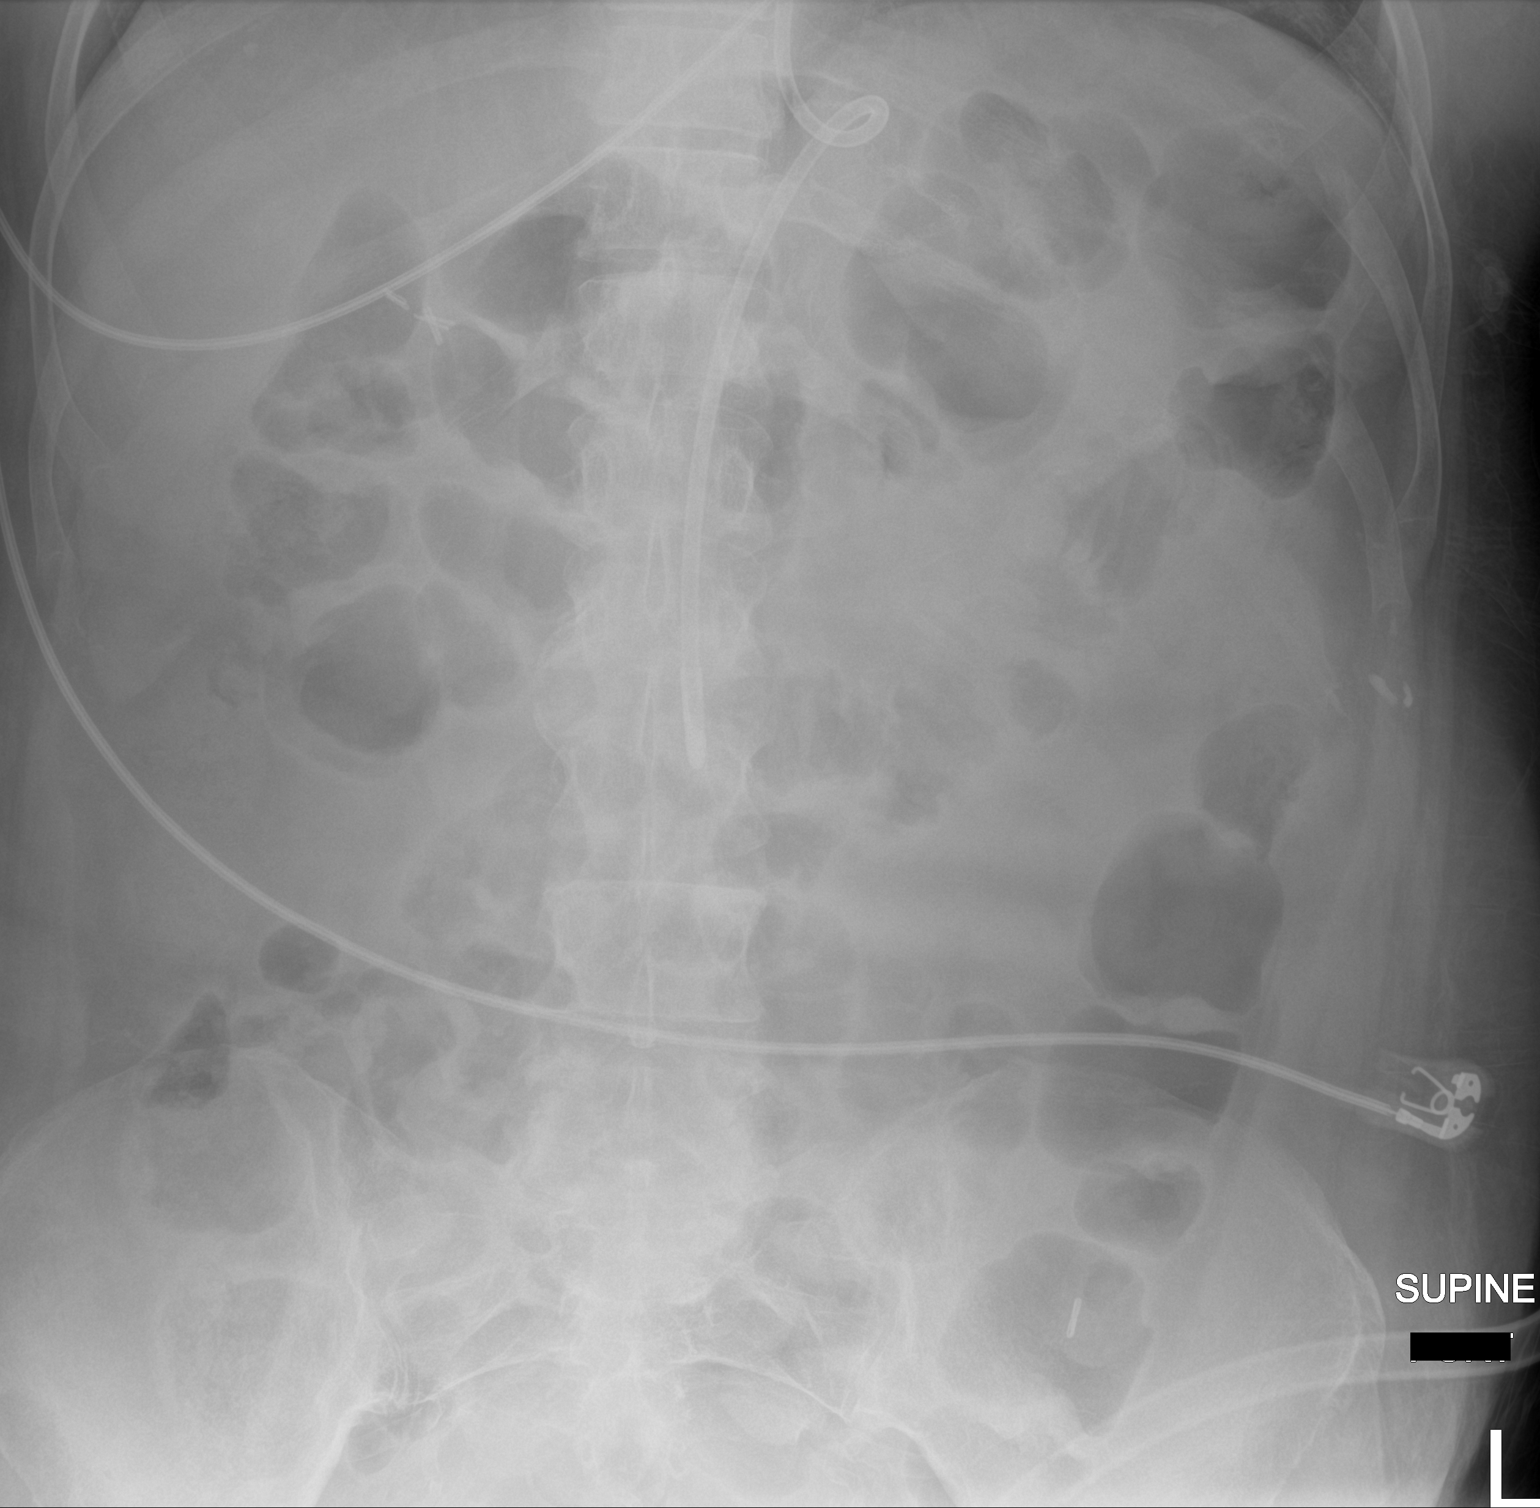

[1 of 1 positions shown; findings below may reference images not displayed]

FINDINGS: Bowel gas pattern is nonspecific. Tip of enteric tube is noted in
the mid abdomen. The tip is possibly in the jejunum passing through
the gastro jejunal anastomosis. Surgical clips are seen in
gallbladder fossa and in the left iliac fossa.
IMPRESSION: Nonspecific bowel gas pattern. Distal course of enteric tube is
noted in the midline with its tip at L3 level. Findings may suggest
tip of feeding tube to be in the jejunum passing through the
gastrojejunal anastomosis. Less likely possibility would be
extraluminal location of tip of feeding tube. If clinically
warranted, repeat radiograph after administration of Gastrografin
through the feeding tube may be considered.

## 2023-02-25 IMAGING — MR MR HEAD W/O CM
7 of 10 series · 39 of 48 positions shown · non-contrast
Comparison: 02/10/2021

CLINICAL DATA: Mental status change, persistent or worsening

EXAM:
MRI HEAD WITHOUT CONTRAST
TECHNIQUE: Multiplanar, multiecho pulse sequences of the brain and surrounding
structures were obtained without intravenous contrast.

[Series 3: DWI · axial · 3.0mm · 1.09mm/px · z∈[-40,+93]mm · 11 of 100 slices shown (1 of 4)]
[im 1/100]
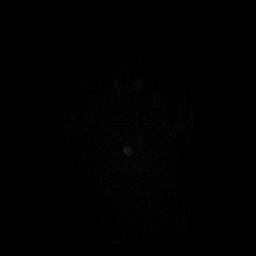
[im 10/100]
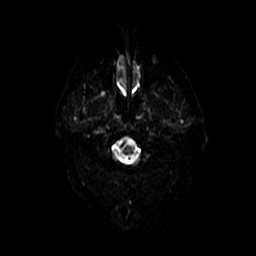
[im 20/100]
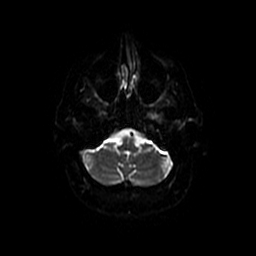
[im 30/100]
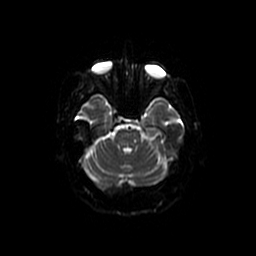
[im 40/100]
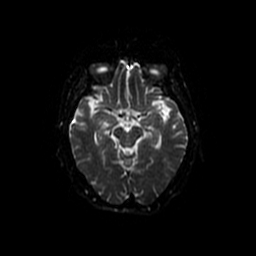
[im 50/100]
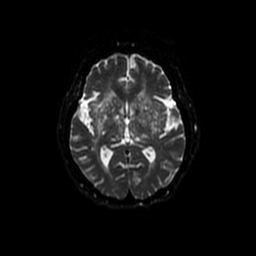
[im 60/100]
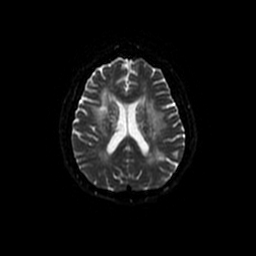
[im 70/100]
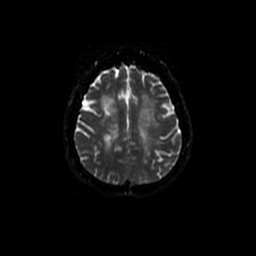
[im 80/100]
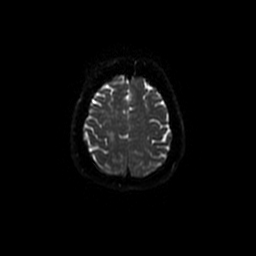
[im 90/100]
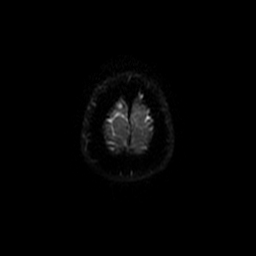
[im 100/100]
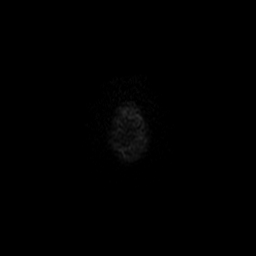

[Series 4: DWI · coronal · 5.0mm · 1.09mm/px · 8 of 70 slices shown (2 of 4)]
[im 1/70]
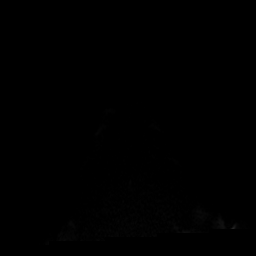
[im 10/70]
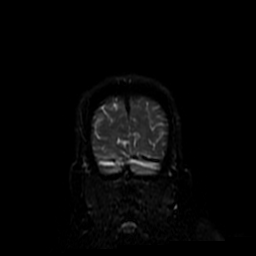
[im 20/70]
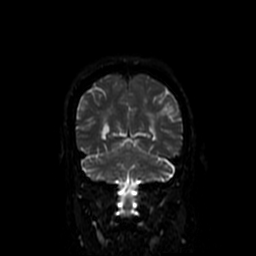
[im 30/70]
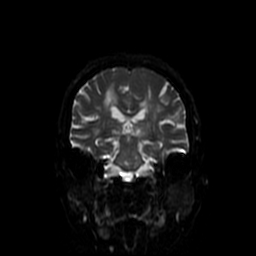
[im 40/70]
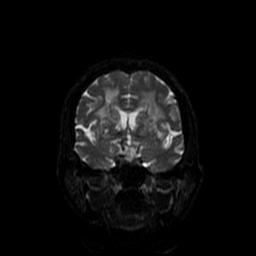
[im 50/70]
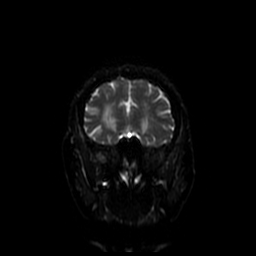
[im 60/70]
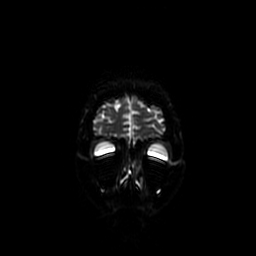
[im 70/70]
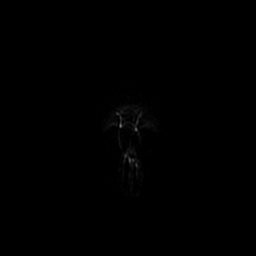

[Series 6: FLAIR · axial · 5.0mm · 0.43mm/px · z∈[-53,+77]mm · 3 of 25 slices shown]
[im 1/25]
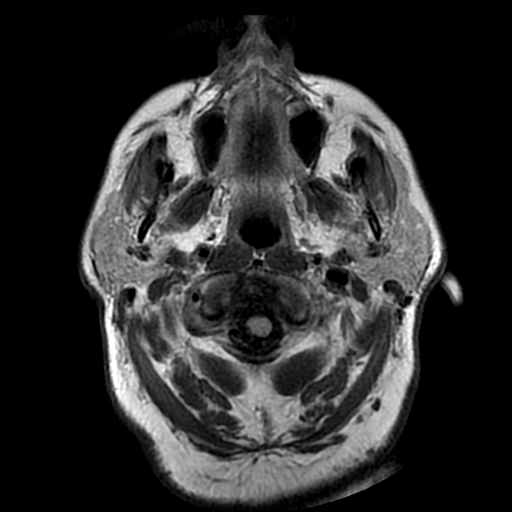
[im 13/25]
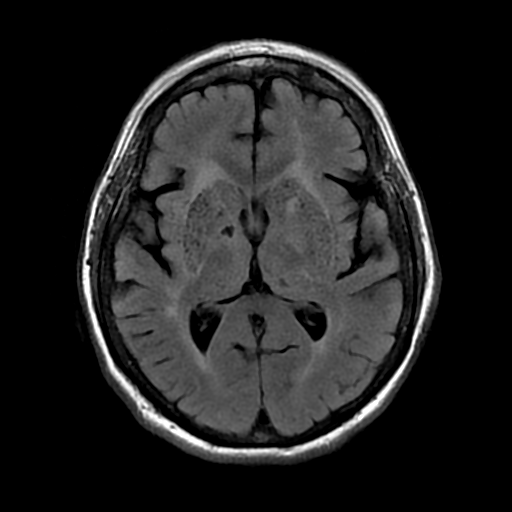
[im 25/25]
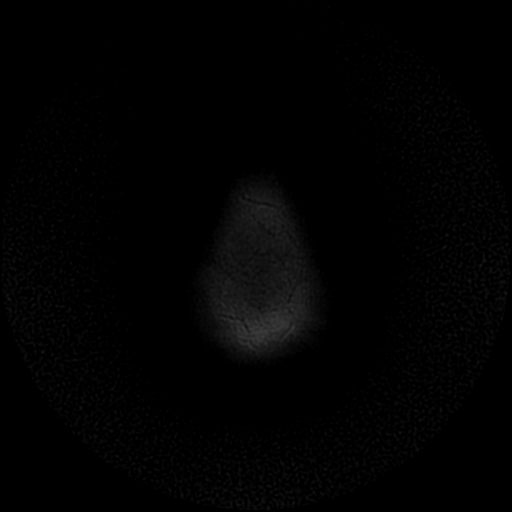

[Series 8: T2 · axial · 5.0mm · 0.43mm/px · z∈[-53,+77]mm · 3 of 25 slices shown (1 of 2)]
[im 1/25]
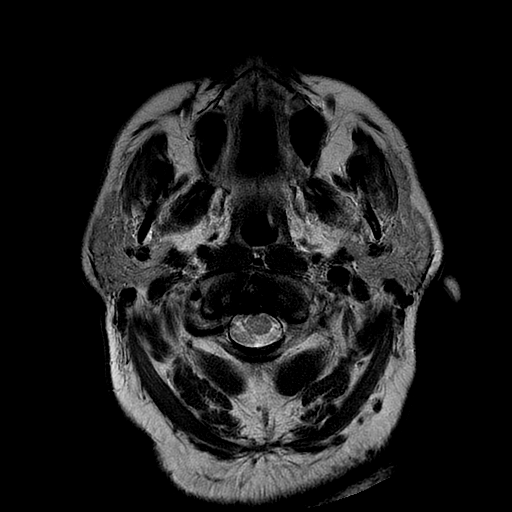
[im 13/25]
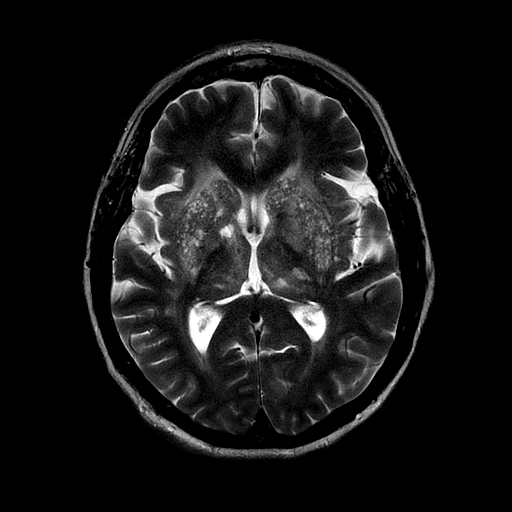
[im 25/25]
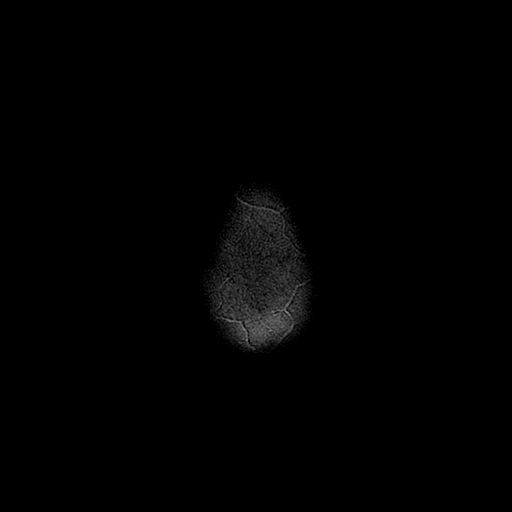

[Series 10: T2 · coronal · 5.0mm · 0.43mm/px · 4 of 30 slices shown (2 of 2)]
[im 1/30]
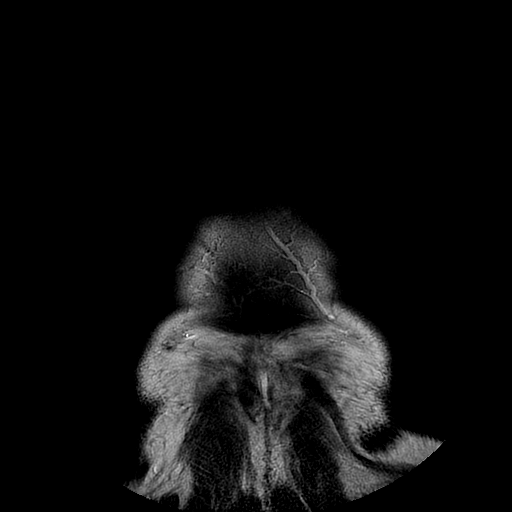
[im 10/30]
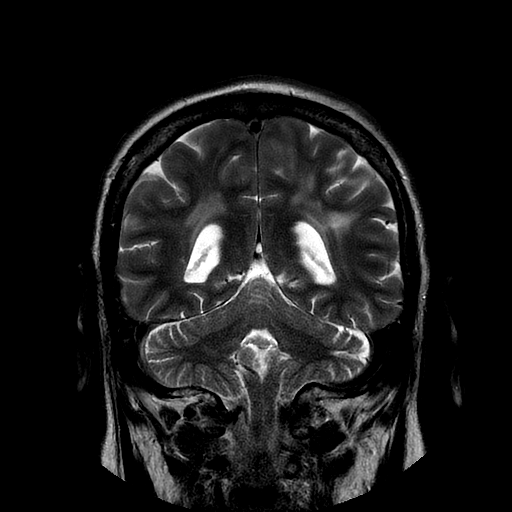
[im 20/30]
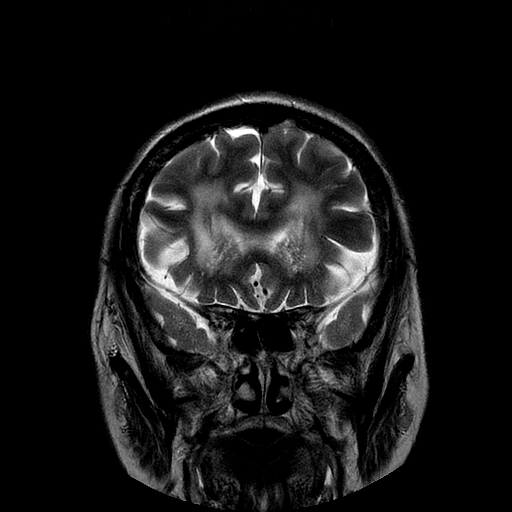
[im 30/30]
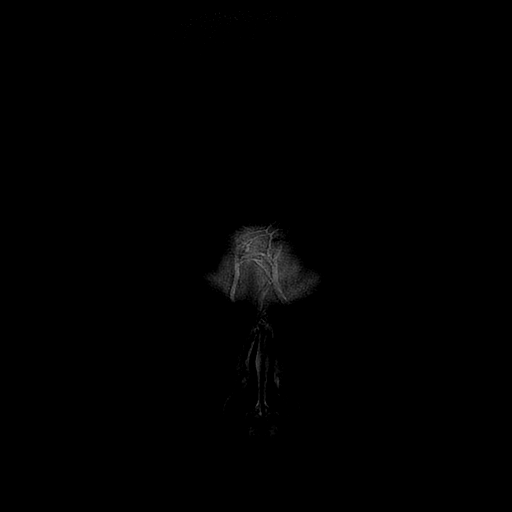

[Series 300: DWI · axial · 3.0mm · 1.09mm/px · z∈[-40,+93]mm · 6 of 50 slices shown (3 of 4)]
[im 1/50]
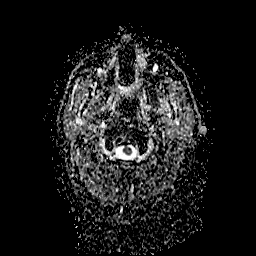
[im 10/50]
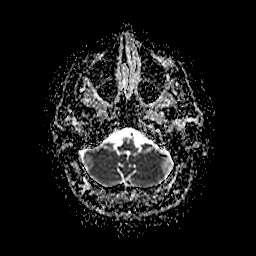
[im 20/50]
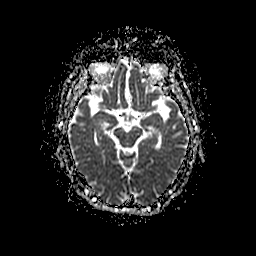
[im 30/50]
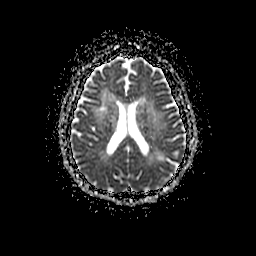
[im 40/50]
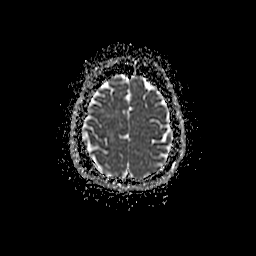
[im 50/50]
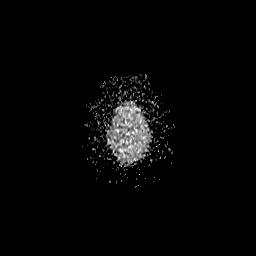

[Series 400: DWI · coronal · 5.0mm · 1.09mm/px · 4 of 35 slices shown (4 of 4)]
[im 1/35]
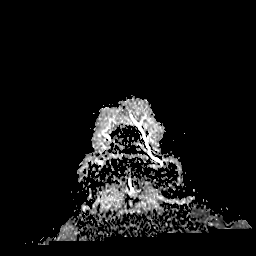
[im 12/35]
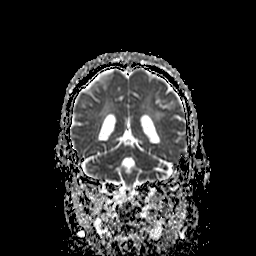
[im 23/35]
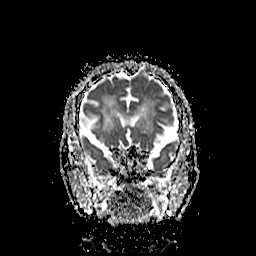
[im 35/35]
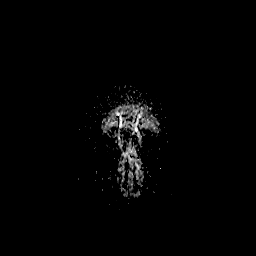

[39 of 48 positions shown; findings below may reference images not displayed]

FINDINGS: Brain: Persistent small ill-defined foci of diffusion hyperintensity
without restriction. No new diffusion abnormality.

Ventricles and sulci are stable in size and configuration. Confluent
areas of T2 hyperintensity are identified in the supratentorial and
pontine white matter, nonspecific but probably reflecting advanced
chronic microvascular ischemic changes. Chronic left parietal
cortical/subcortical infarct. Chronic small vessel infarcts and
prominent perivascular spaces of the central gray nuclei and central
white matter as well as chronic infarcts of the pons and cerebellum.
Minimal punctate foci of susceptibility likely reflecting chronic
microhemorrhages.

No mass, mass effect, hydrocephalus, or extra-axial collection.

Vascular: Major vessel flow voids at the skull base are preserved.

Skull and upper cervical spine: Normal marrow signal is preserved.

Sinuses/Orbits: Paranasal sinuses are aerated. No acute orbital
abnormality.

Other: Sella is unremarkable.  Mastoid air cells are clear.
IMPRESSION: Persistent foci of ill-defined diffusion hyperintensity in the
posterior left frontal centrum semiovale probably reflecting
subacute to chronic infarcts.

No acute or new abnormality. Stable chronic findings detailed above.

## 2023-04-18 IMAGING — CT CT T SPINE W/O CM
3 of 4 series · 12 of 33 positions shown, 14 images · non-contrast
Comparison: None.

CLINICAL DATA: Patient states she has difficulty with balance, fell
3 times yesterday.

EXAM:
CT THORACIC AND LUMBAR SPINE WITHOUT CONTRAST
TECHNIQUE: Multidetector CT imaging of the thoracic and lumbar spine was
performed without intravenous contrast. Multiplanar CT image
reconstructions were also generated.
RADIATION DOSE REDUCTION: This exam was performed according to the
departmental dose-optimization program which includes automated
exposure control, adjustment of the mA and/or kV according to
patient size and/or use of iterative reconstruction technique.

[Series 5: t spine soft · axial · 0.31mm/px · z∈[-788,-614]mm · 4 of 175 slices shown]
[im 30/175  soft-tissue]
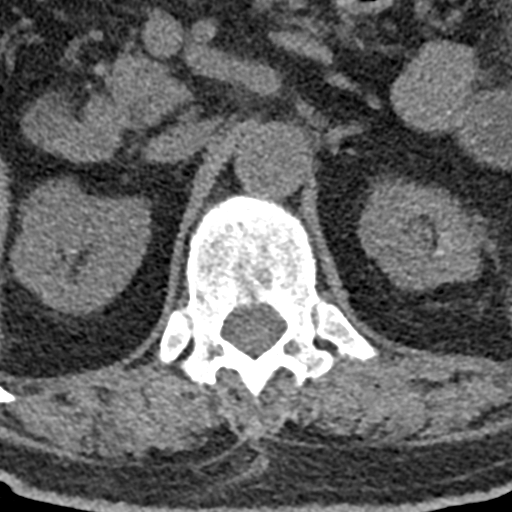
[im 59/175  soft-tissue]
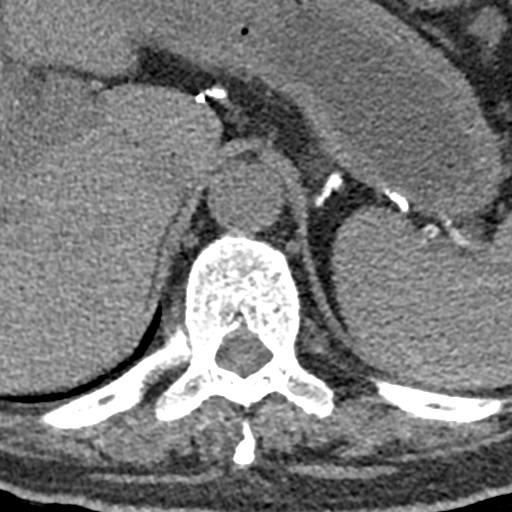
[im 88/175  soft-tissue]
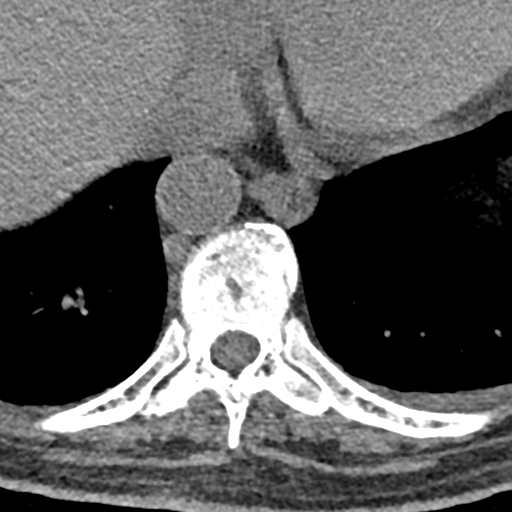
[im 117/175  soft-tissue]
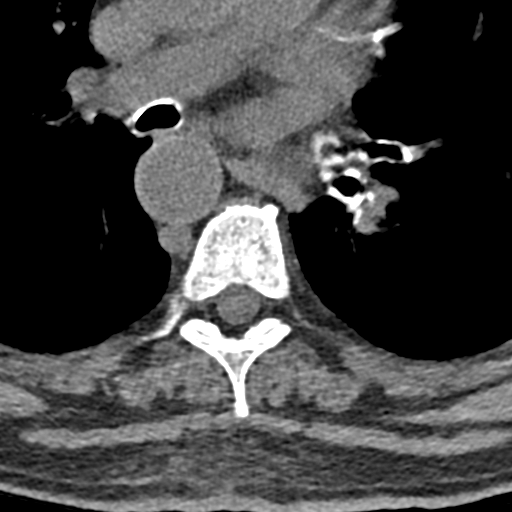

[Series 8: coronal bone · coronal · 0.25mm/px · 3 of 87 slices shown]
[im 18/87  bone]
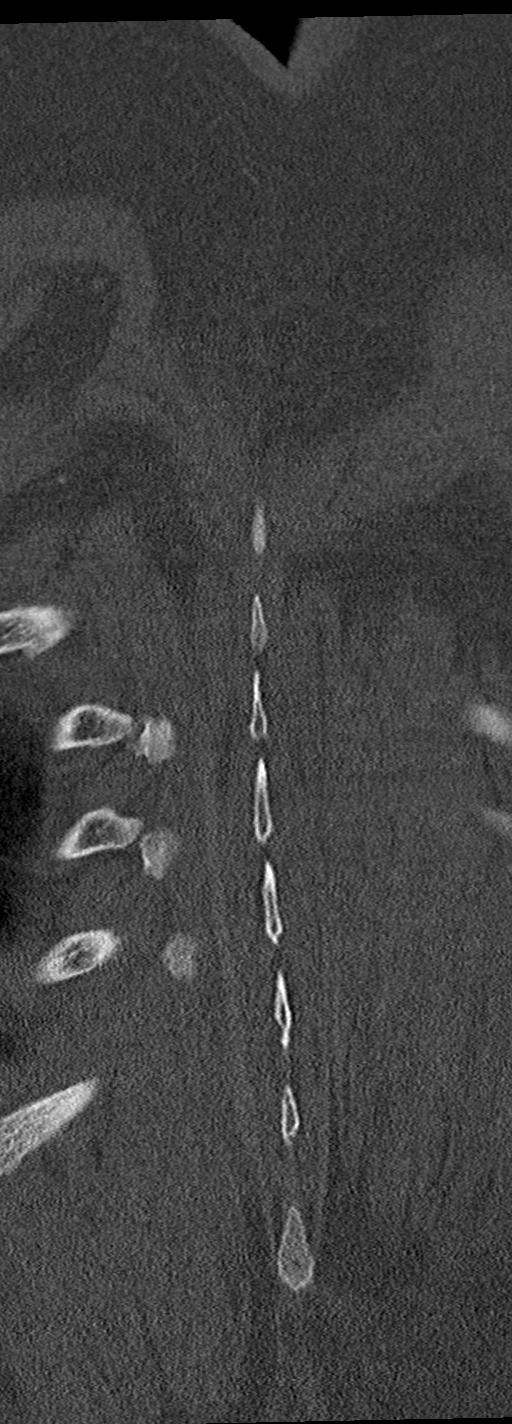
[im 35/87  bone]
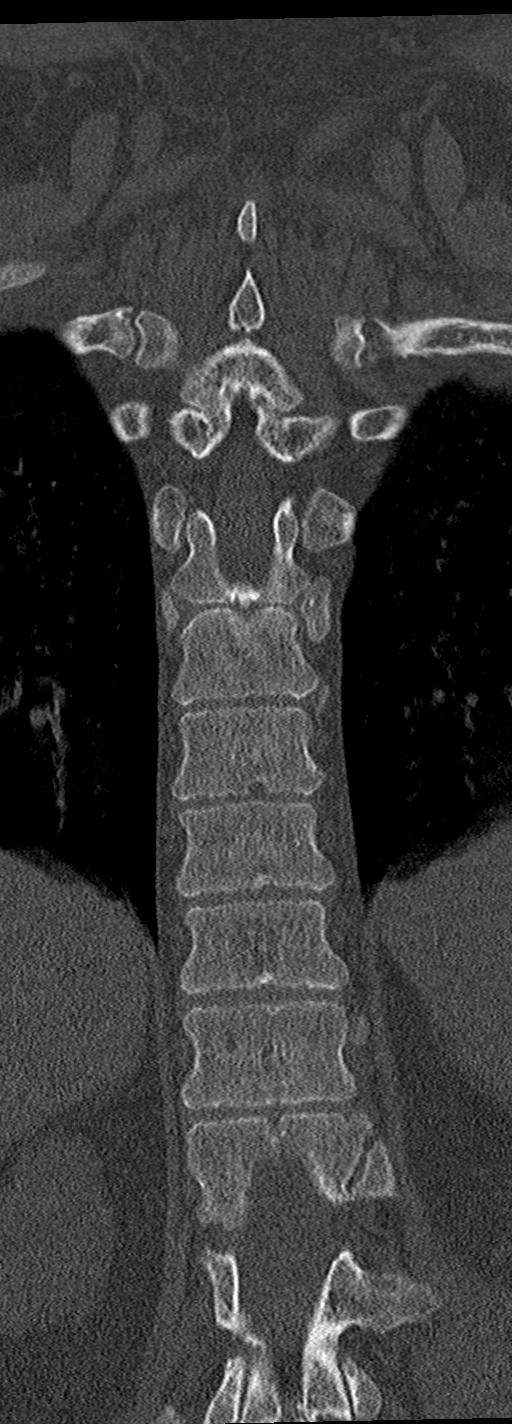
[im 52/87  bone]
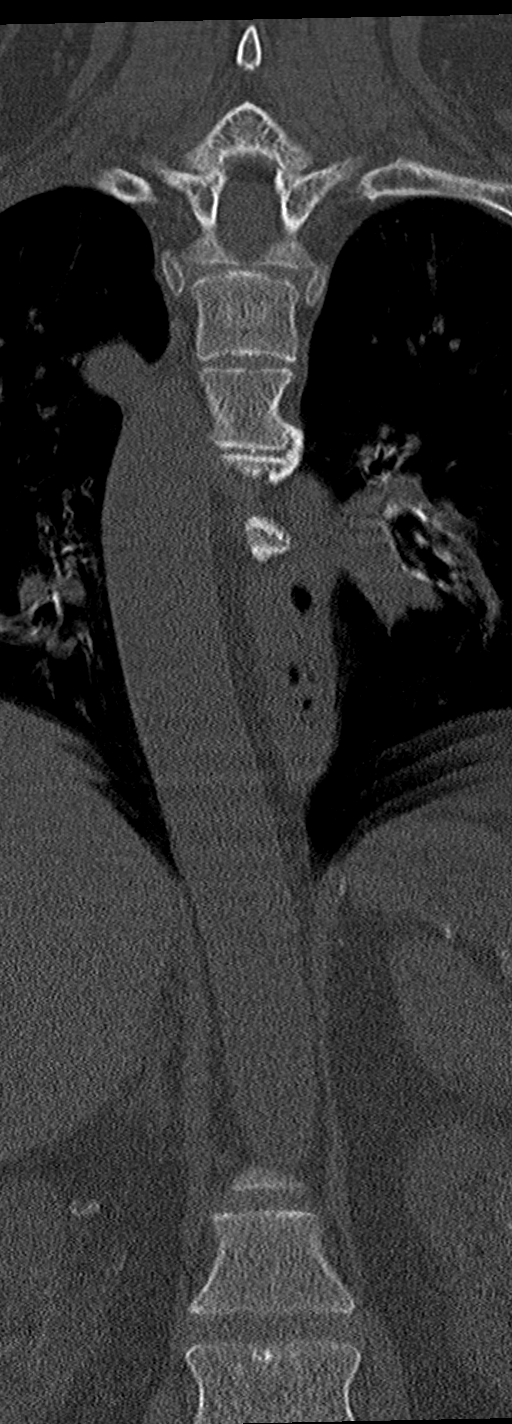

[Series 9: multidisc · axial · 0.21mm/px · z∈[-783,-565]mm · 5 of 184 slices shown, 7 images]
[im 31/184  soft-tissue]
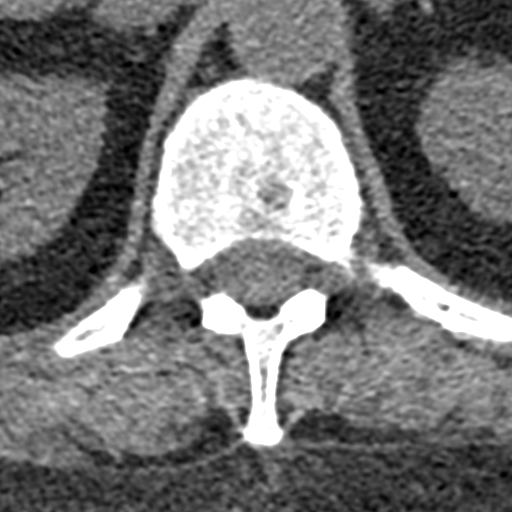
[im 31/184  bone]
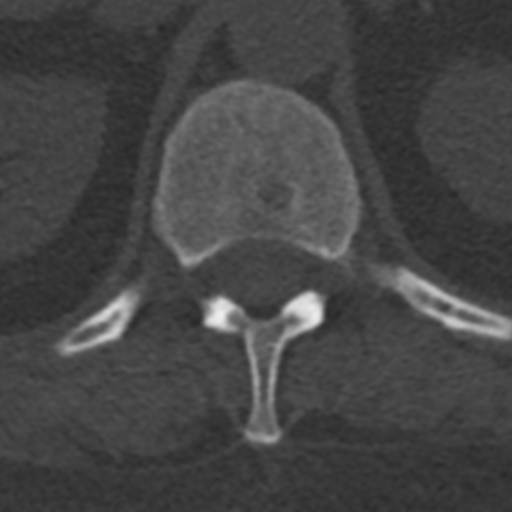
[im 62/184  bone]
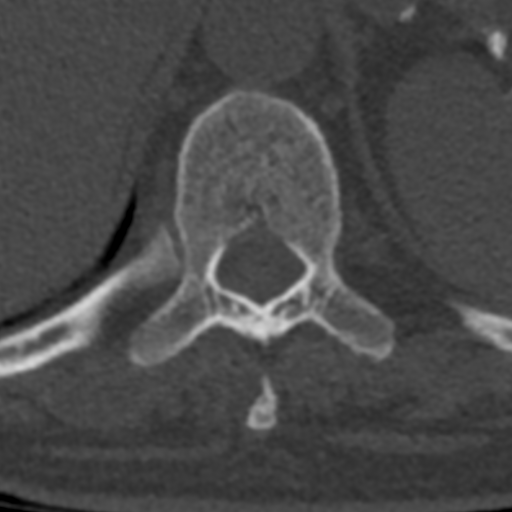
[im 92/184  bone]
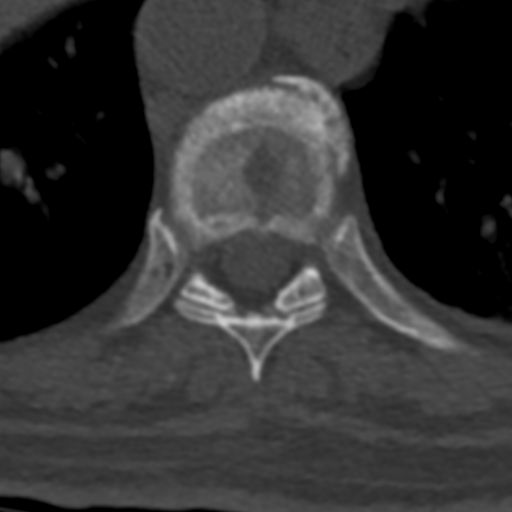
[im 123/184  bone]
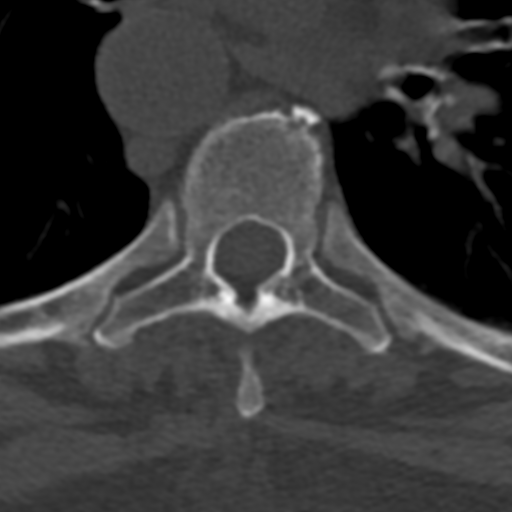
[im 153/184  soft-tissue]
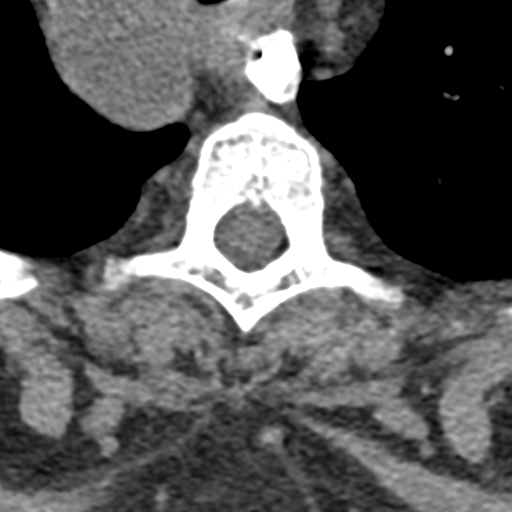
[im 153/184  bone]
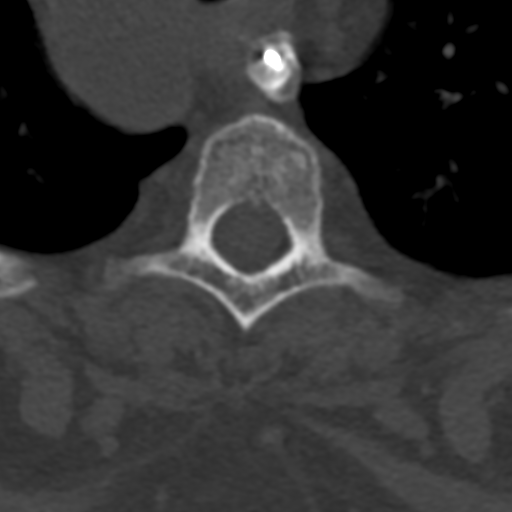

[12 of 33 positions shown; findings below may reference images not displayed]

FINDINGS: CT THORACIC SPINE FINDINGS

Alignment: Normal.

Vertebrae: No acute fracture or focal pathologic process.

Paraspinal and other soft tissues: Right upper lobe ground-glass
opacity concerning for atelectasis or infiltrate.

Disc levels: Multilevel degenerate disc disease with Schmorl node in
the inferior endplate of T5, T6, T8 and T11.

CT LUMBAR SPINE FINDINGS

Segmentation: 5 lumbar type vertebrae.

Alignment: Normal.

Vertebrae: No acute fracture or focal pathologic process.

Paraspinal and other soft tissues: Negative.

Disc levels: Mild multilevel degenerative disc disease of the lumbar
spine. Small anterior osteophytes at multiple levels. Mild facet
joint arthropathy.
IMPRESSION: Thoracic spine:

1.  No evidence of fracture or subluxation.

2. Multilevel degenerate disc disease, with Schmorl nodes at T5, T6,
T8 and T11. No significant spinal canal or neural foraminal
stenosis.

3. Small subcentimeter ground-glass opacities in the right upper
lobe, which may represent atelectasis or infectious process.
Clinical correlation is suggested.

Lumbar spine

4.  No acute fracture or subluxation of the lumbar spine.

5. Degenerate disc disease of the lumbar spine prominent at L4-L5
and L5-S1.

## 2023-04-18 IMAGING — CR DG CHEST 2V
1 series · 2 of 2 positions shown · non-contrast
Comparison: 03/08/2021

CLINICAL DATA: Decreased balance.  Fall.

EXAM:
CHEST - 2 VIEW

[Series 1: dg chest 2 view · 0.14mm/px · 2 of 2 slices shown]
[im 1/2]
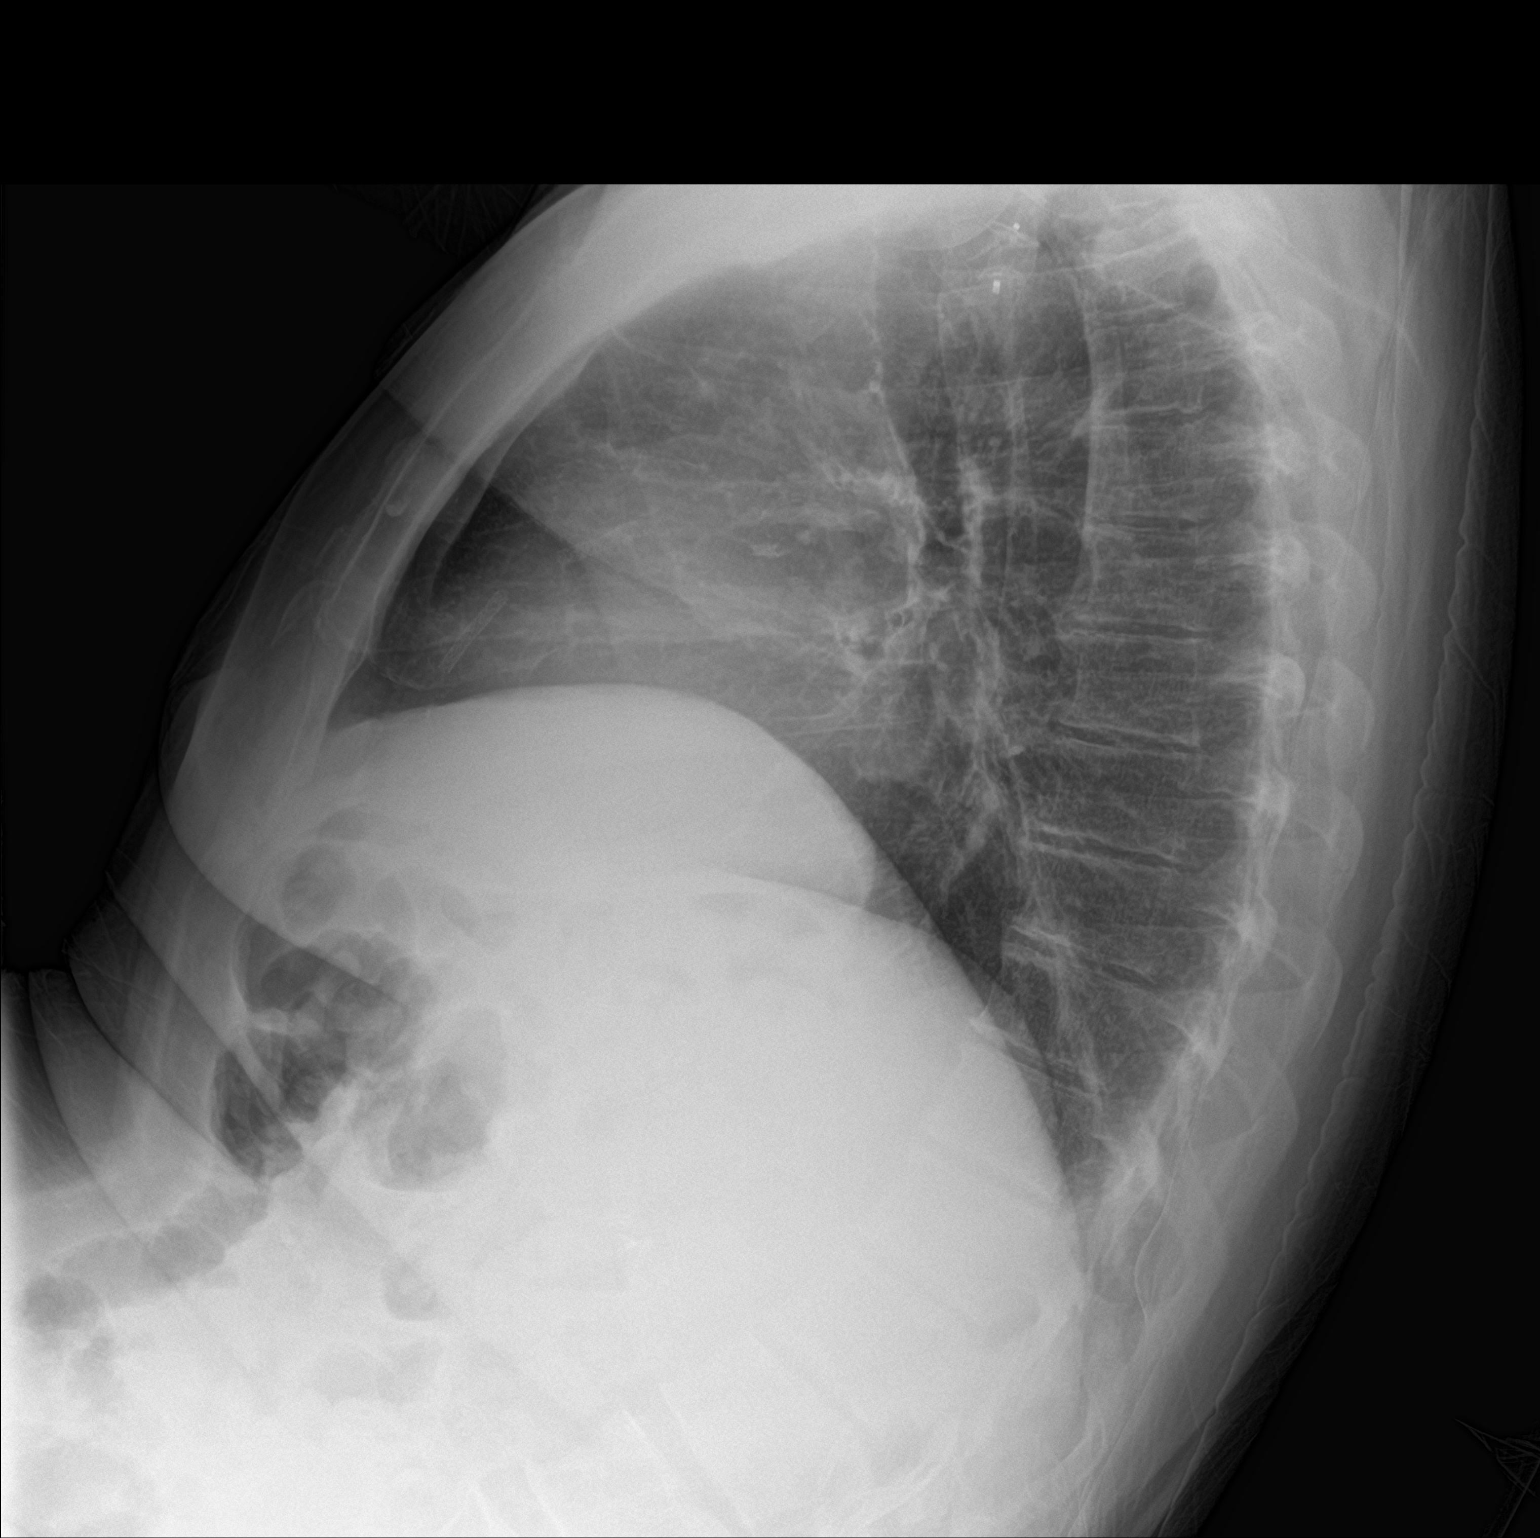
[im 2/2]
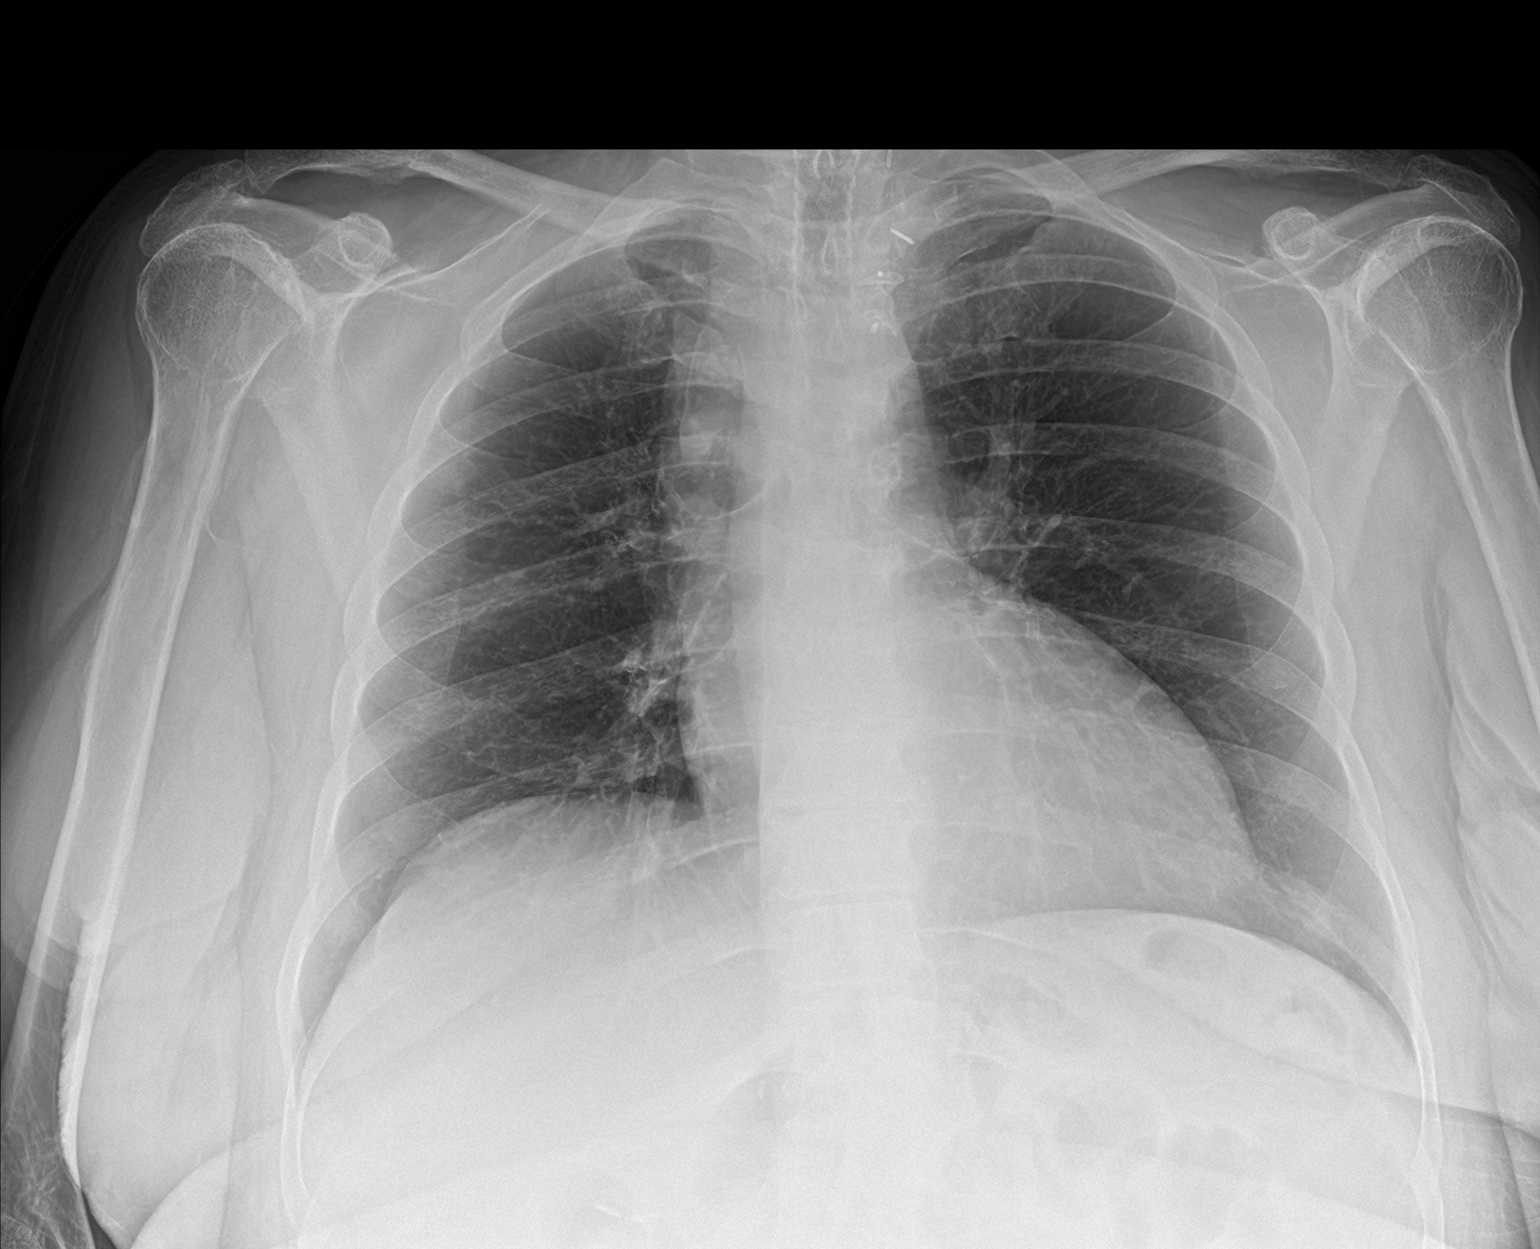

[2 of 2 positions shown; findings below may reference images not displayed]

FINDINGS: Lateral view degraded by patient arm position. Mild right
hemidiaphragm elevation. Midline trachea. Normal heart size. No
pleural effusion or pneumothorax. Surgical clips in the left side of
the thoracic inlet. Right-sided aortic arch with aberrant left-sided
subclavian artery, as on prior CT. No acute osseous abnormality.
IMPRESSION: No acute cardiopulmonary disease.

## 2023-04-18 IMAGING — CT CT L SPINE W/O CM
4 of 5 series · 16 of 33 positions shown, 18 images · non-contrast
Comparison: None.

CLINICAL DATA: Patient states she has difficulty with balance, fell
3 times yesterday.

EXAM:
CT THORACIC AND LUMBAR SPINE WITHOUT CONTRAST
TECHNIQUE: Multidetector CT imaging of the thoracic and lumbar spine was
performed without intravenous contrast. Multiplanar CT image
reconstructions were also generated.
RADIATION DOSE REDUCTION: This exam was performed according to the
departmental dose-optimization program which includes automated
exposure control, adjustment of the mA and/or kV according to
patient size and/or use of iterative reconstruction technique.

[Series 5: l spine soft · axial · 0.34mm/px · z∈[-966,-906]mm · 3 of 90 slices shown]
[im 15/90  soft-tissue]
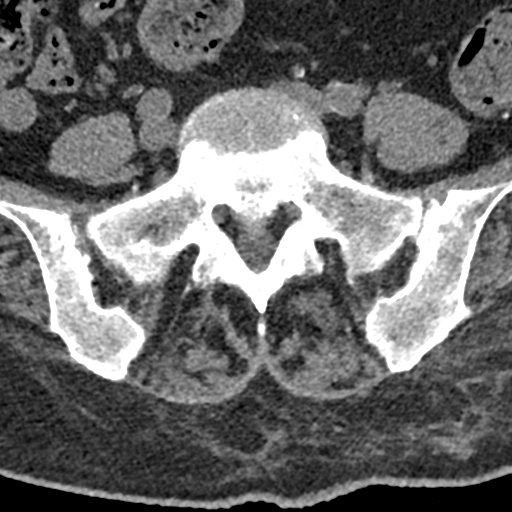
[im 30/90  soft-tissue]
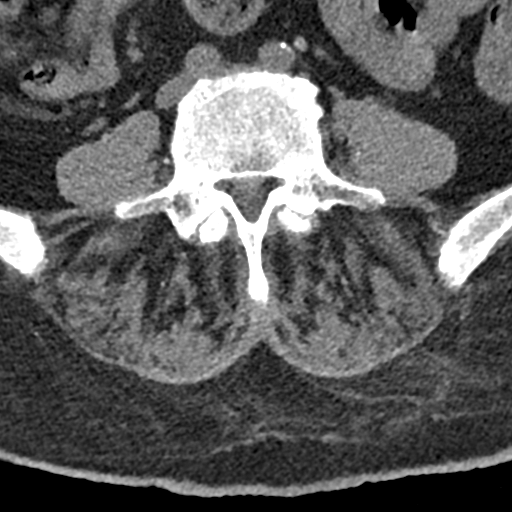
[im 45/90  soft-tissue]
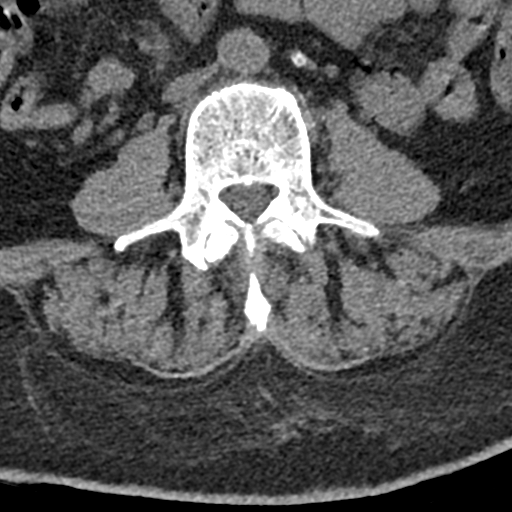

[Series 6: sagittal bone · sagittal · 0.31mm/px · 5 of 90 slices shown]
[im 15/90  bone]
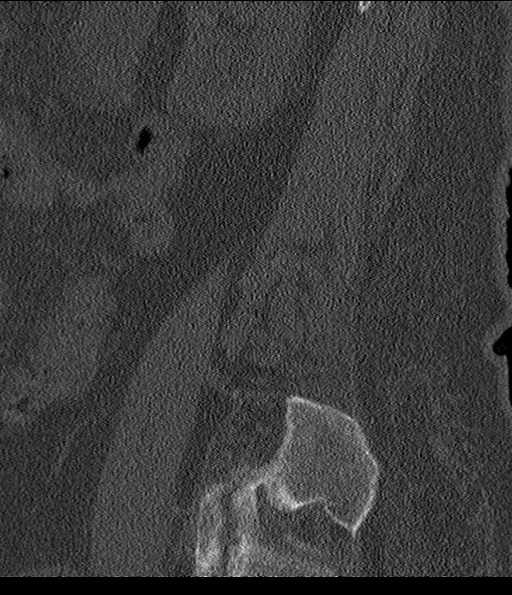
[im 30/90  bone]
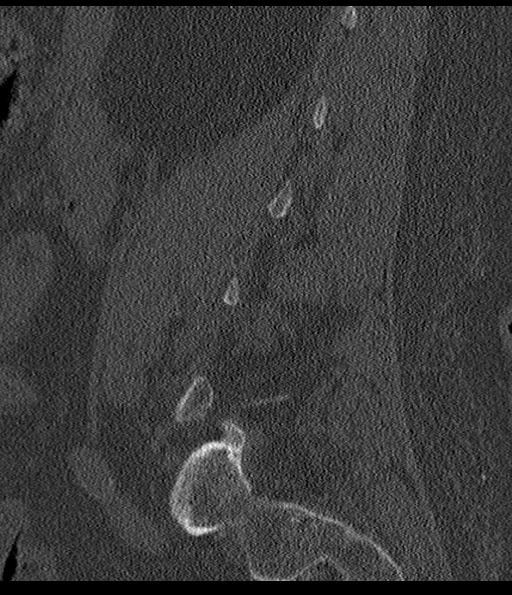
[im 45/90  bone]
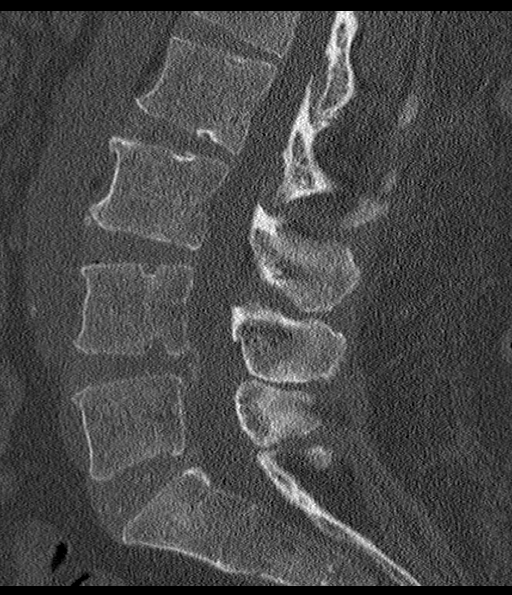
[im 60/90  bone]
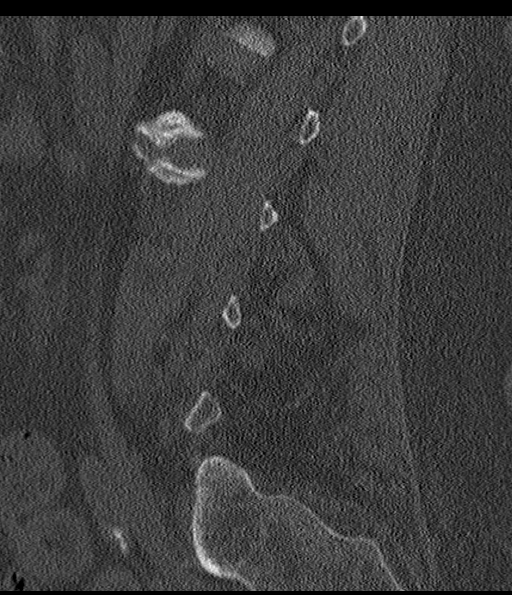
[im 75/90  bone]
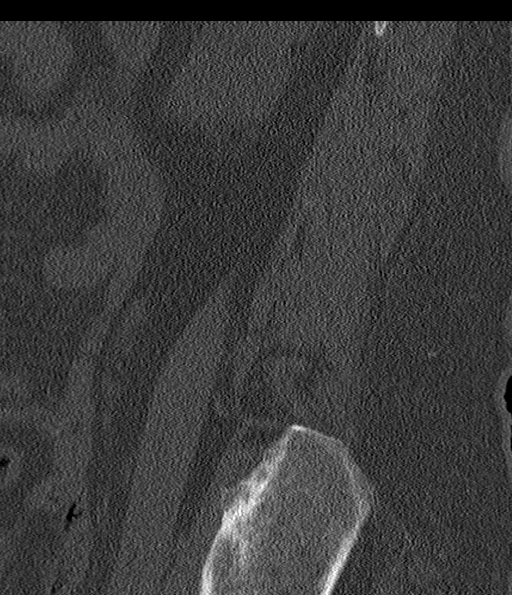

[Series 8: coronal bone · coronal · 0.35mm/px · 3 of 81 slices shown]
[im 17/81  bone]
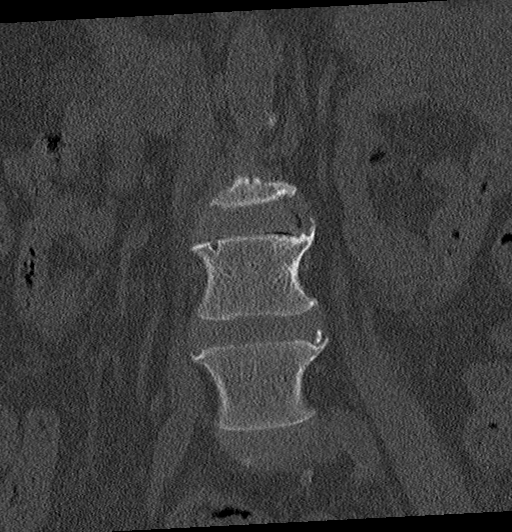
[im 33/81  bone]
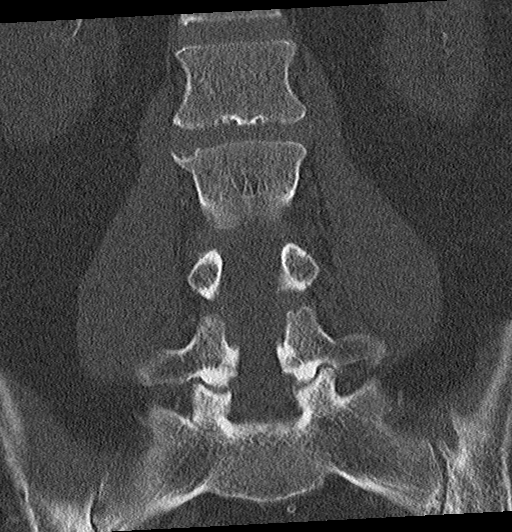
[im 49/81  bone]
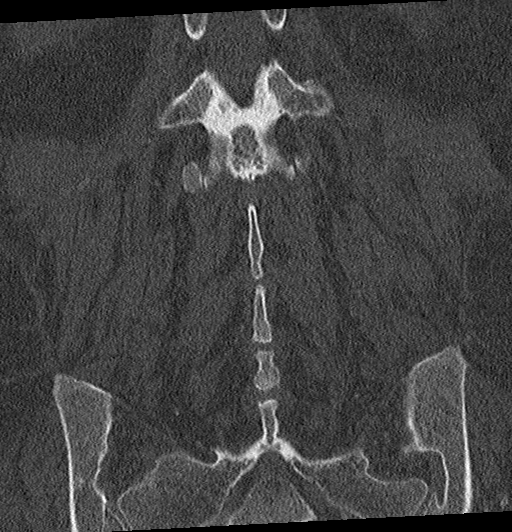

[Series 9: multi disc · axial · 0.27mm/px · z∈[-990,-833]mm · 5 of 93 slices shown, 7 images]
[im 16/93  soft-tissue]
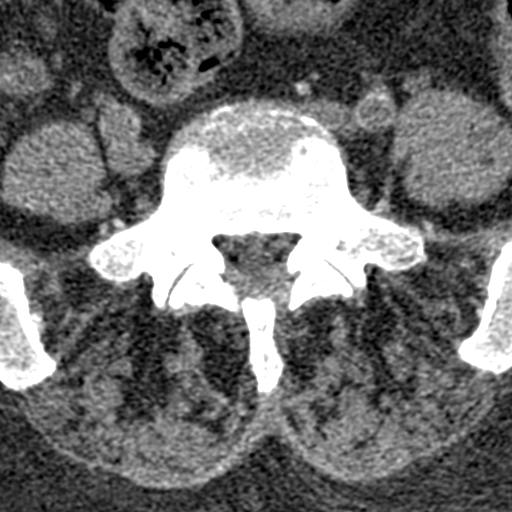
[im 16/93  bone]
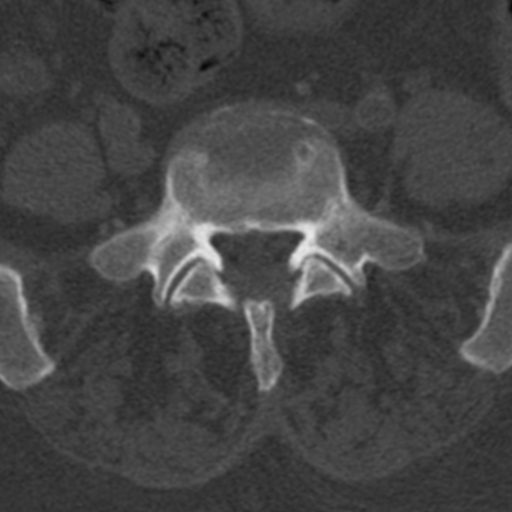
[im 31/93  bone]
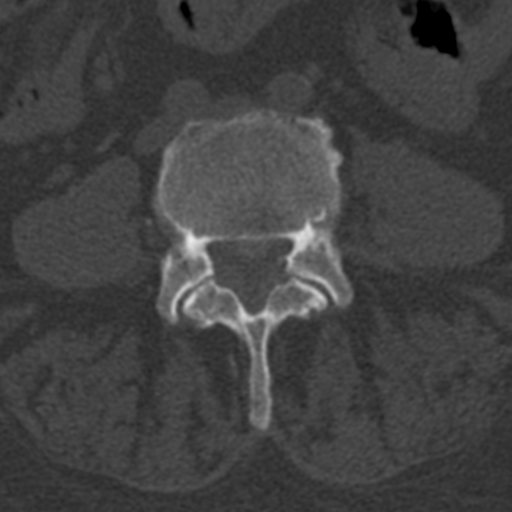
[im 47/93  bone]
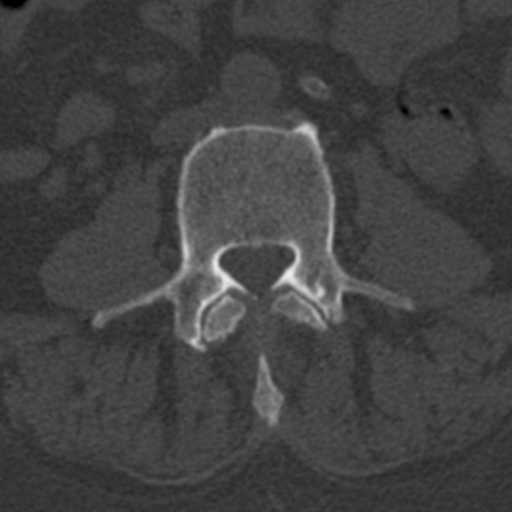
[im 62/93  bone]
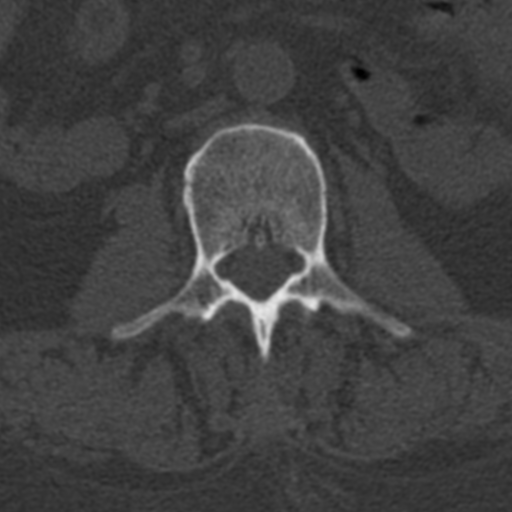
[im 77/93  soft-tissue]
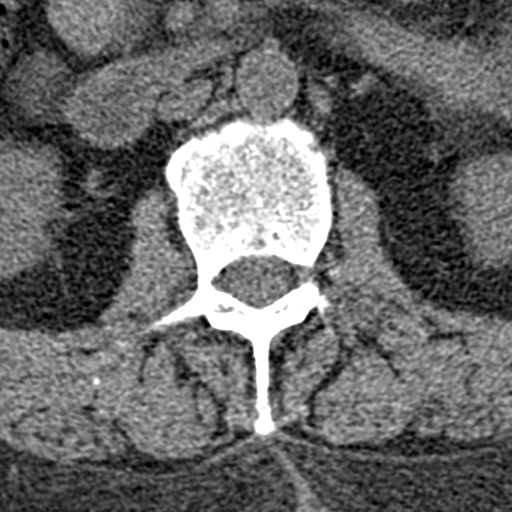
[im 77/93  bone]
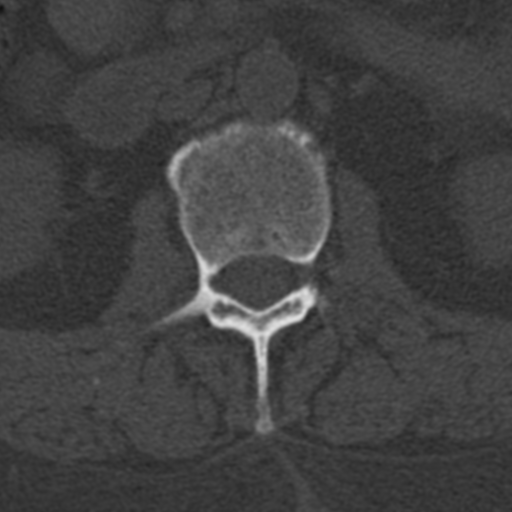

[16 of 33 positions shown; findings below may reference images not displayed]

FINDINGS: CT THORACIC SPINE FINDINGS

Alignment: Normal.

Vertebrae: No acute fracture or focal pathologic process.

Paraspinal and other soft tissues: Right upper lobe ground-glass
opacity concerning for atelectasis or infiltrate.

Disc levels: Multilevel degenerate disc disease with Schmorl node in
the inferior endplate of T5, T6, T8 and T11.

CT LUMBAR SPINE FINDINGS

Segmentation: 5 lumbar type vertebrae.

Alignment: Normal.

Vertebrae: No acute fracture or focal pathologic process.

Paraspinal and other soft tissues: Negative.

Disc levels: Mild multilevel degenerative disc disease of the lumbar
spine. Small anterior osteophytes at multiple levels. Mild facet
joint arthropathy.
IMPRESSION: Thoracic spine:

1.  No evidence of fracture or subluxation.

2. Multilevel degenerate disc disease, with Schmorl nodes at T5, T6,
T8 and T11. No significant spinal canal or neural foraminal
stenosis.

3. Small subcentimeter ground-glass opacities in the right upper
lobe, which may represent atelectasis or infectious process.
Clinical correlation is suggested.

Lumbar spine

4.  No acute fracture or subluxation of the lumbar spine.

5. Degenerate disc disease of the lumbar spine prominent at L4-L5
and L5-S1.

## 2023-04-18 IMAGING — CT CT HEAD W/O CM
4 series · 16 of 47 positions shown, 18 images · non-contrast
Comparison: CT head 03/31/2021, CT cervical spine and CT head
12/05/2020.

CLINICAL DATA: Seizures, balance issues



[Series 2: head bone · axial · 0.39mm/px · z∈[-140,-112]mm · 3 of 72 slices shown]
[im 8/72  bone]
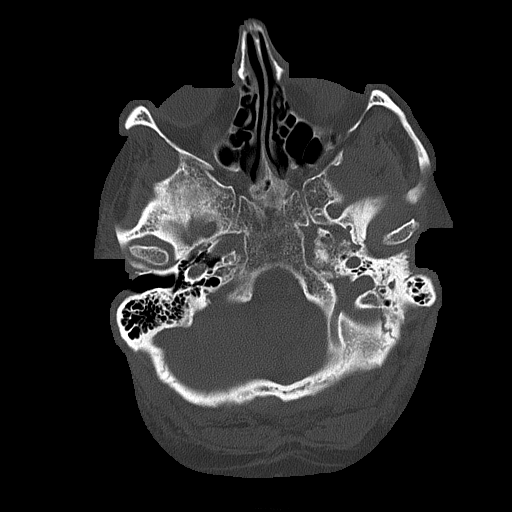
[im 15/72  bone]
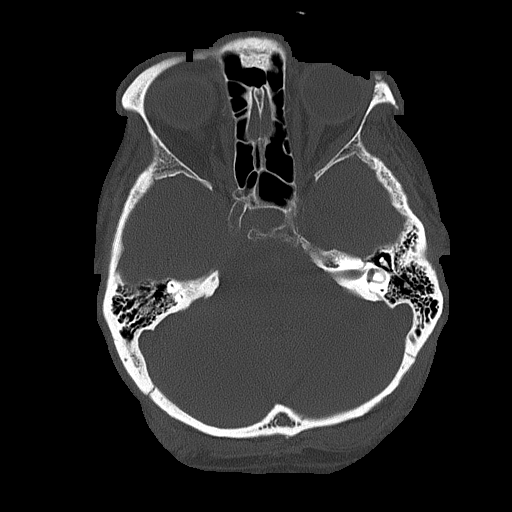
[im 22/72  bone]
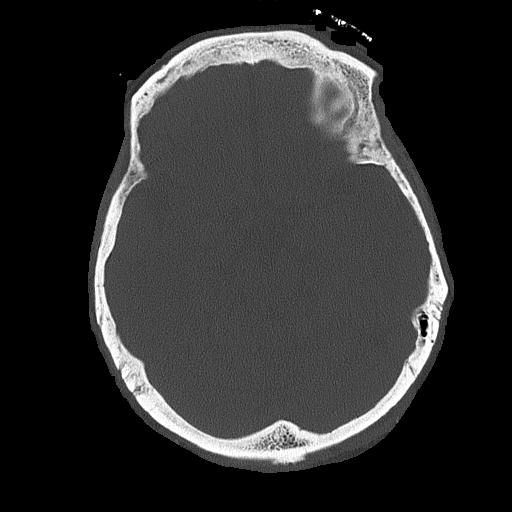

[Series 3: head wo · axial · 0.39mm/px · z∈[-139,-34]mm · 7 of 29 slices shown, 9 images]
[im 4/29  brain]
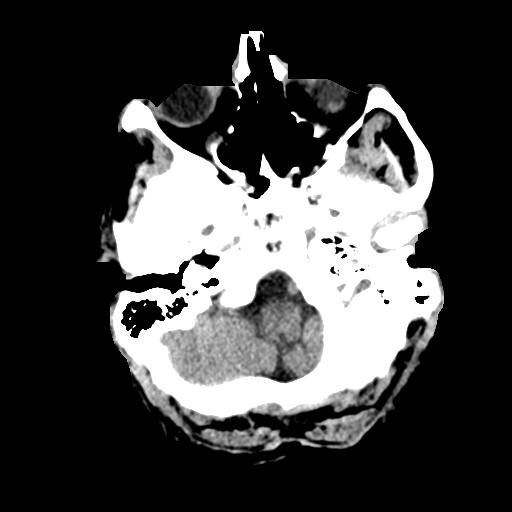
[im 4/29  bone]
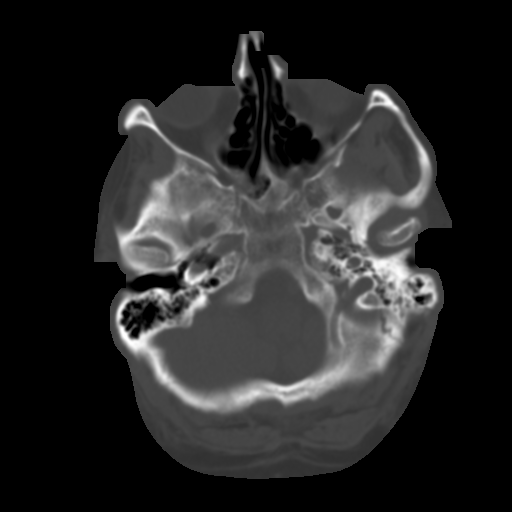
[im 8/29  brain]
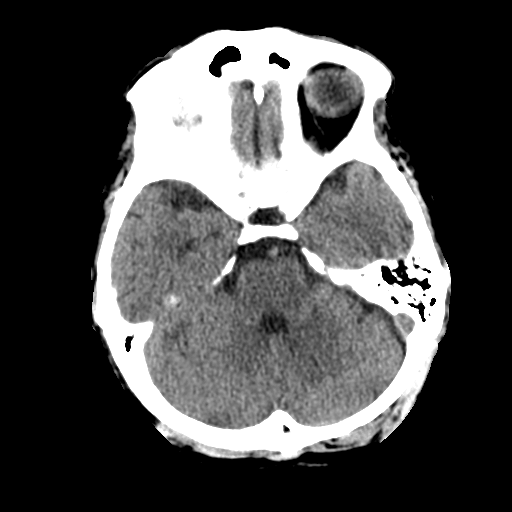
[im 11/29  brain]
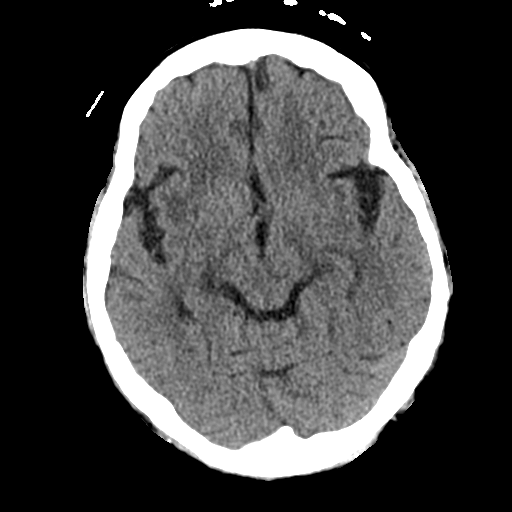
[im 15/29  brain]
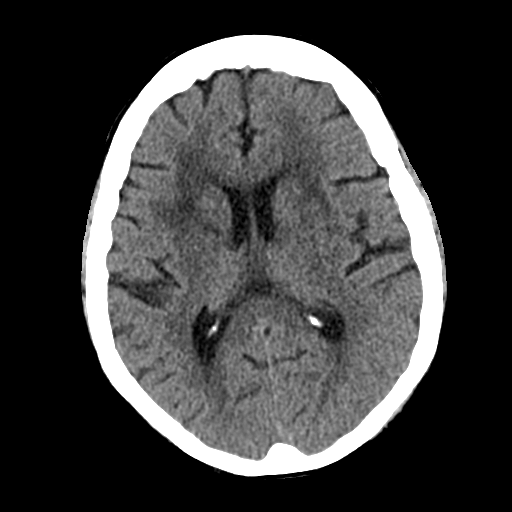
[im 18/29  brain]
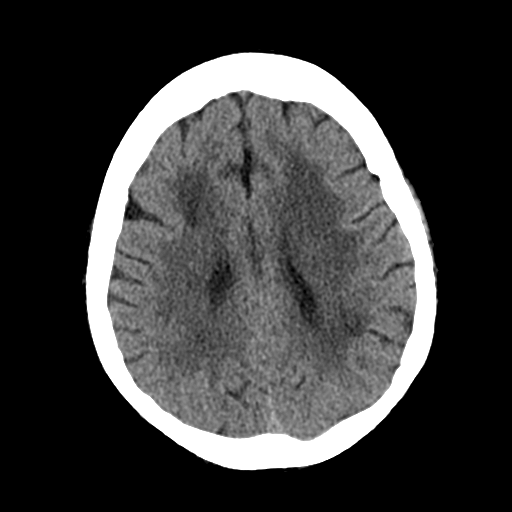
[im 18/29  bone]
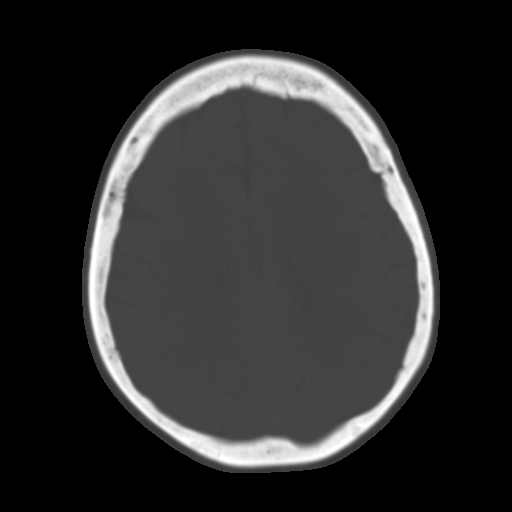
[im 22/29  brain]
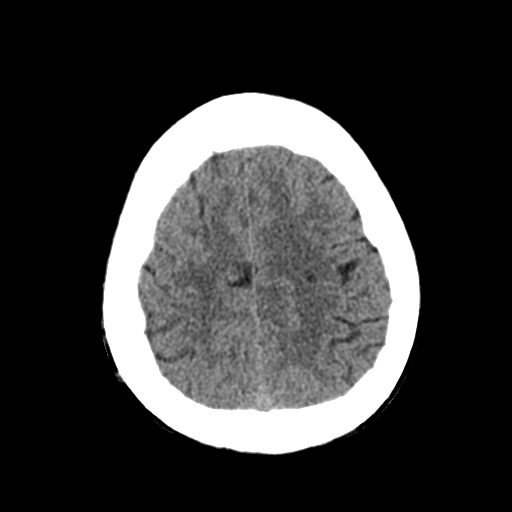
[im 25/29  brain]
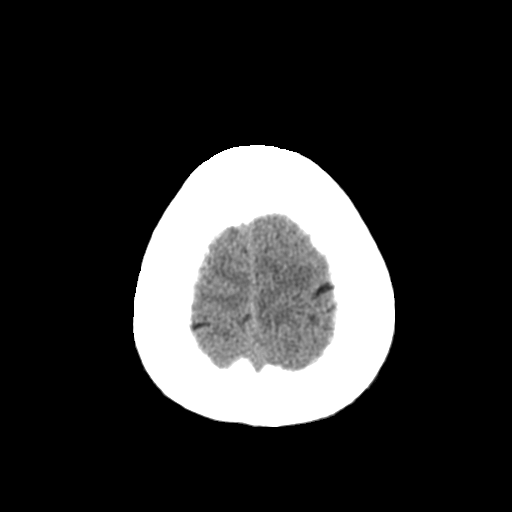

[Series 4: coronal soft tissue · coronal · 0.31mm/px · 3 of 65 slices shown]
[im 22/65  brain]
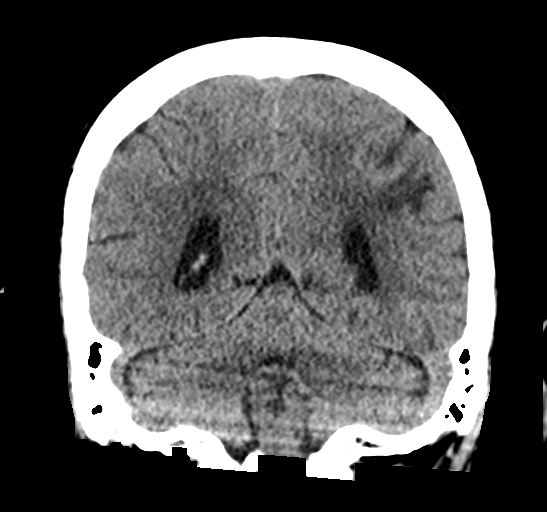
[im 29/65  brain]
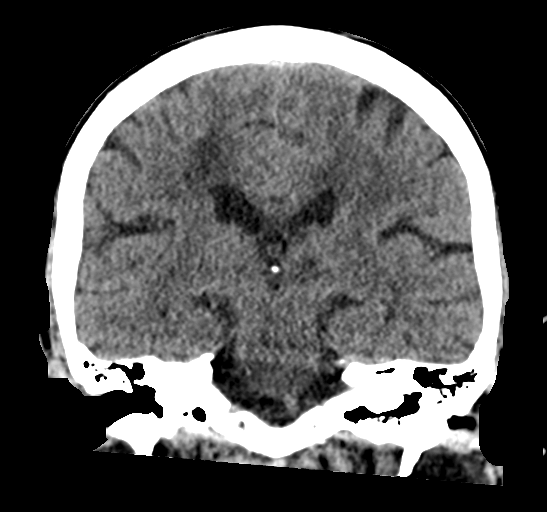
[im 36/65  brain]
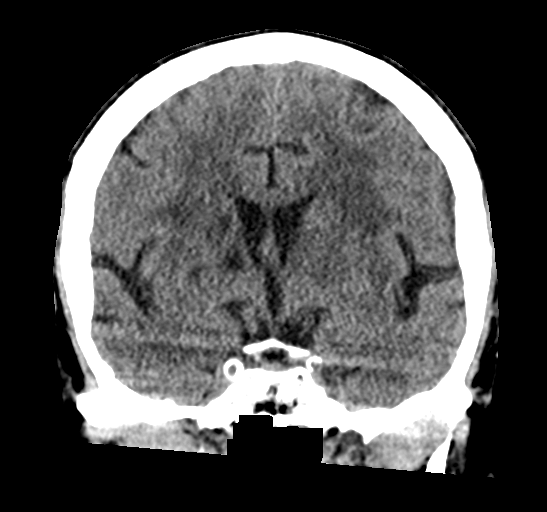

[Series 5: sagittal soft tissue · sagittal · 0.31mm/px · 3 of 57 slices shown]
[im 19/57  brain]
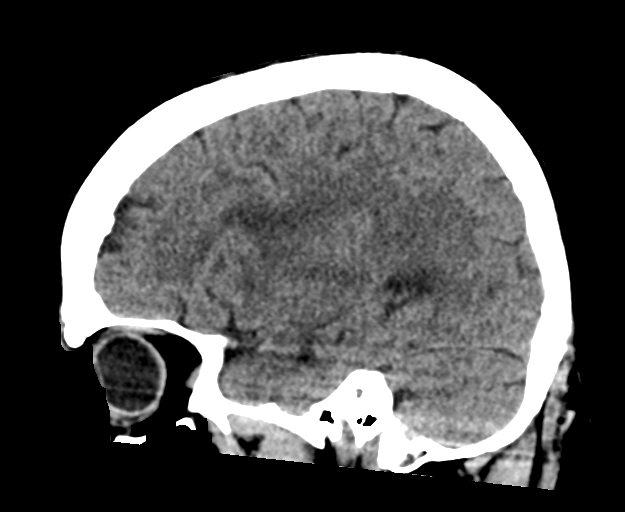
[im 29/57  brain]
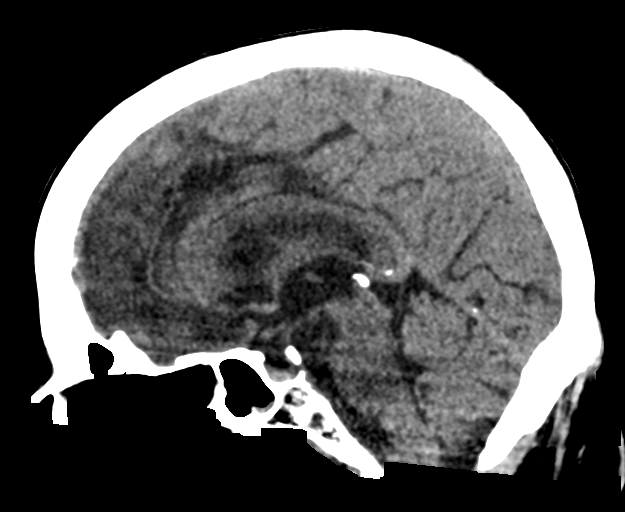
[im 38/57  brain]
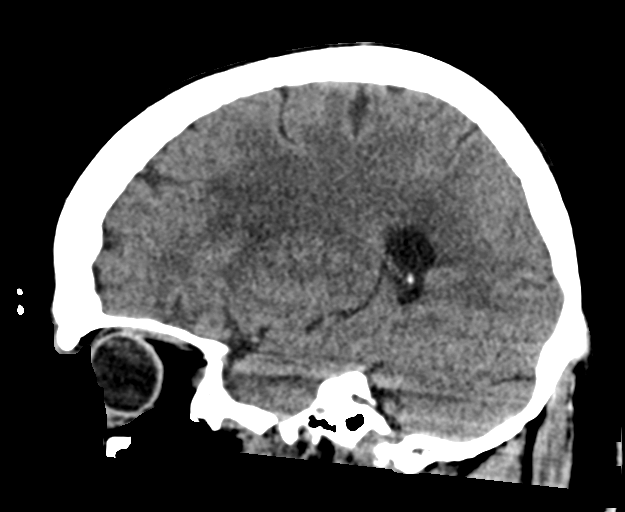

[16 of 47 positions shown; findings below may reference images not displayed]

FINDINGS: CT HEAD FINDINGS

Brain: No evidence of acute infarction, hemorrhage, cerebral edema,
mass, mass effect, or midline shift. No hydrocephalus or extra-axial
fluid collection. Periventricular white matter changes, likely the
sequela of chronic small vessel ischemic disease. Right pons, left
thalamic, and right basal ganglia lacunar infarcts. Remote left
parietal cortical infarct.

Vascular: No hyperdense vessel. Atherosclerotic calcifications in
the intracranial carotid and vertebral arteries.

Skull: Normal. Negative for fracture or focal lesion.

Sinuses/Orbits: Bubbly fluid in the sphenoid sinuses. Otherwise
clear. Status post bilateral lens replacements.

Other: The mastoid air cells are well aerated.

CT CERVICAL SPINE FINDINGS

Alignment: No listhesis.

Skull base and vertebrae: No acute fracture. No primary bone lesion
or focal pathologic process. Large anterior osteophytes.

Soft tissues and spinal canal: No prevertebral fluid or swelling. No
visible canal hematoma.

Disc levels: Relatively preserved disc heights. No high-grade spinal
canal stenosis or neural foraminal narrowing.

Upper chest: Negative.

Other: None.
IMPRESSION: 1.  No acute intracranial process.
2.  No acute fracture or traumatic listhesis in the cervical spine.

## 2023-04-18 IMAGING — CT CT CERVICAL SPINE W/O CM
3 of 4 series · 13 of 33 positions shown, 16 images · non-contrast
Comparison: CT head 03/31/2021, CT cervical spine and CT head
12/05/2020.

CLINICAL DATA: Seizures, balance issues



[Series 6: sagittal bone · sagittal · 0.24mm/px · 5 of 67 slices shown, 6 images]
[im 23/67  bone]
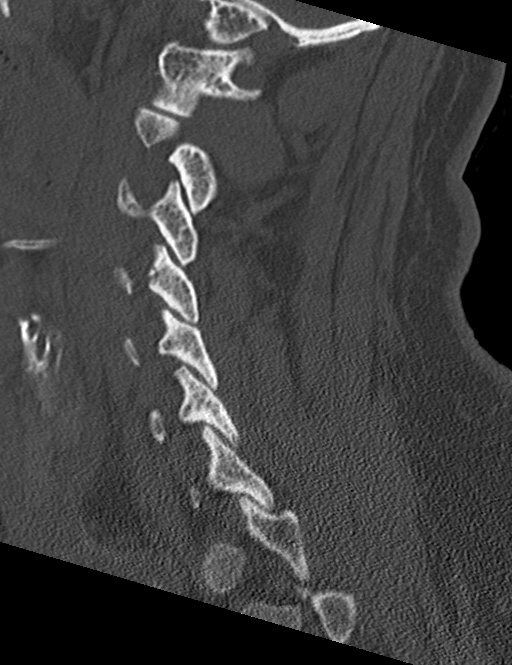
[im 28/67  bone]
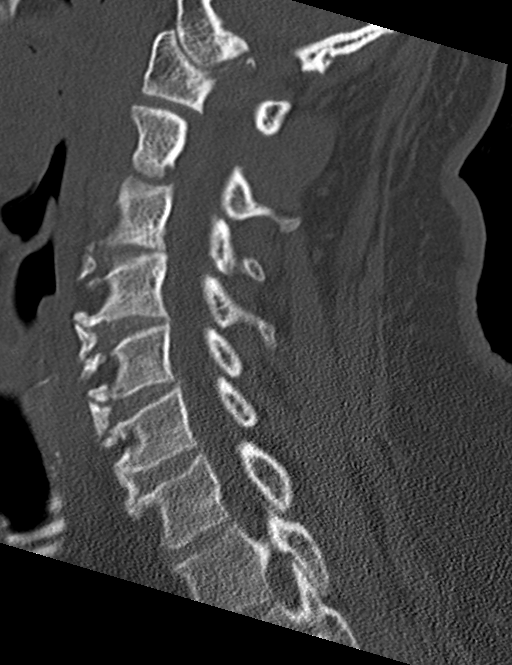
[im 34/67  soft-tissue]
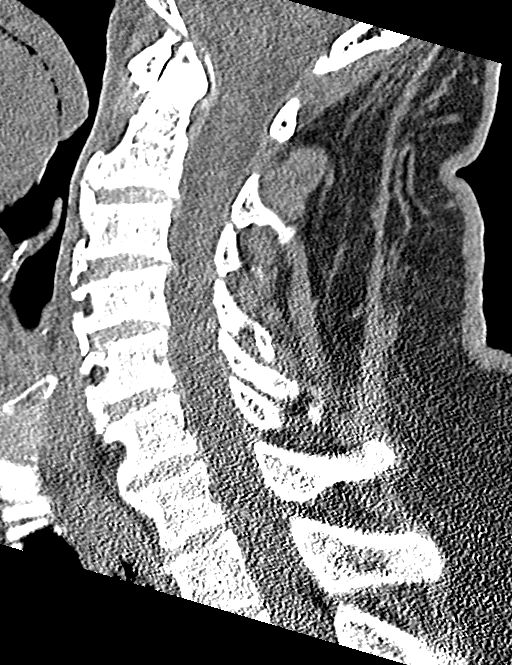
[im 34/67  bone]
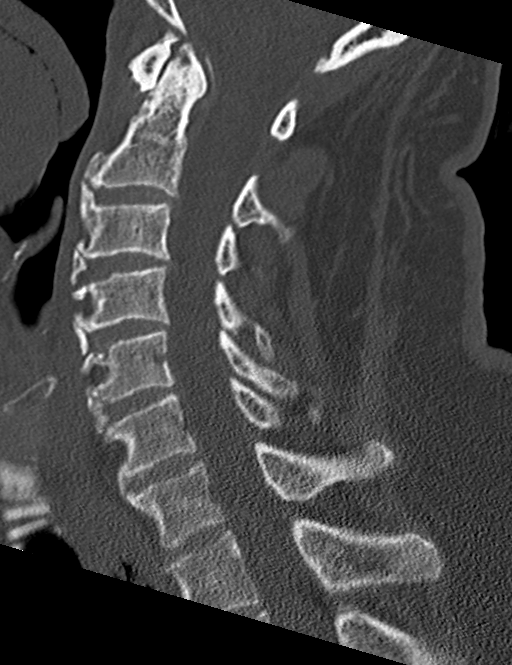
[im 39/67  bone]
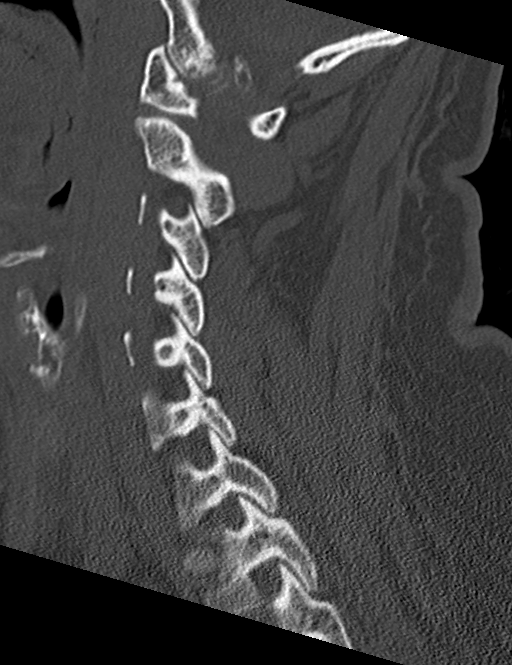
[im 45/67  bone]
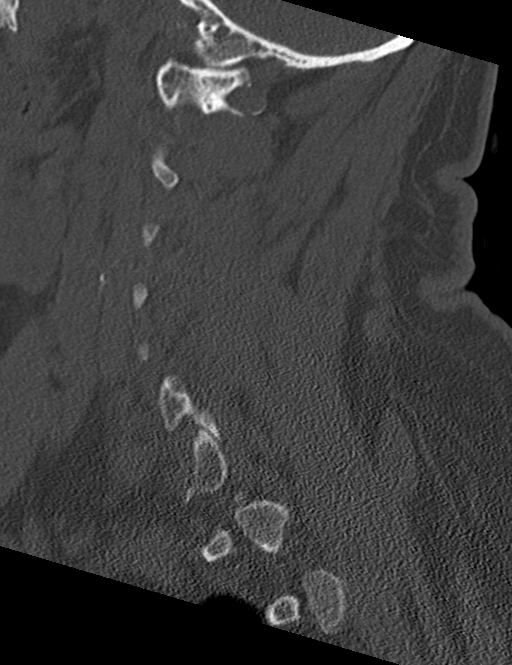

[Series 7: coronal bone · coronal · 0.26mm/px · 3 of 63 slices shown]
[im 13/63  bone]
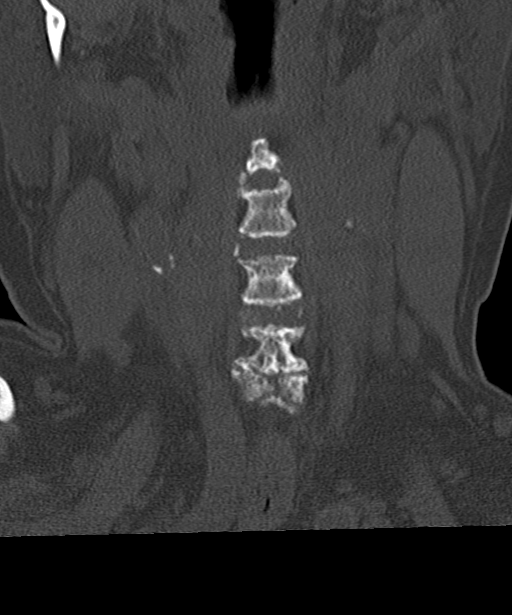
[im 25/63  bone]
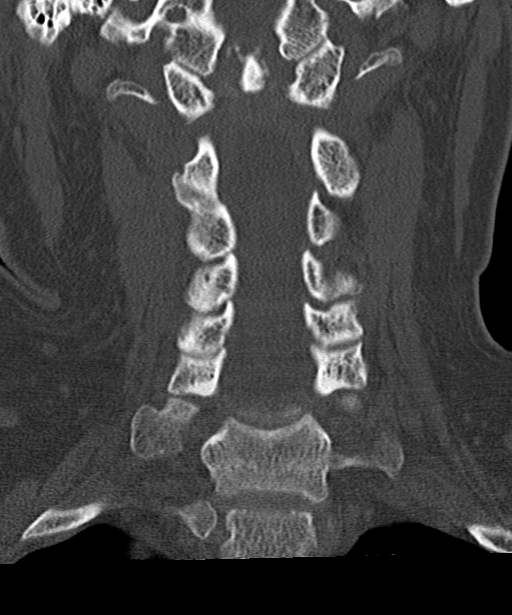
[im 38/63  bone]
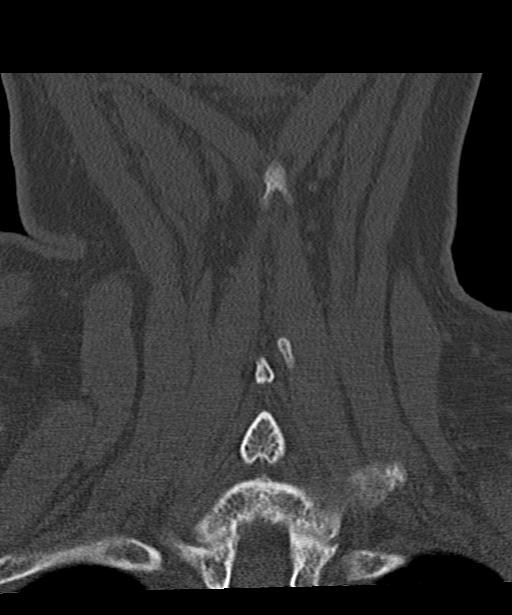

[Series 8: orthogonal bone · axial · 0.24mm/px · z∈[-302,-200]mm · 5 of 81 slices shown, 7 images]
[im 14/81  soft-tissue]
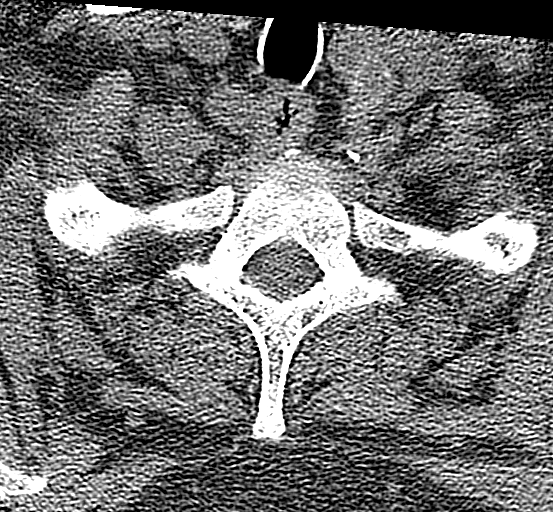
[im 14/81  bone]
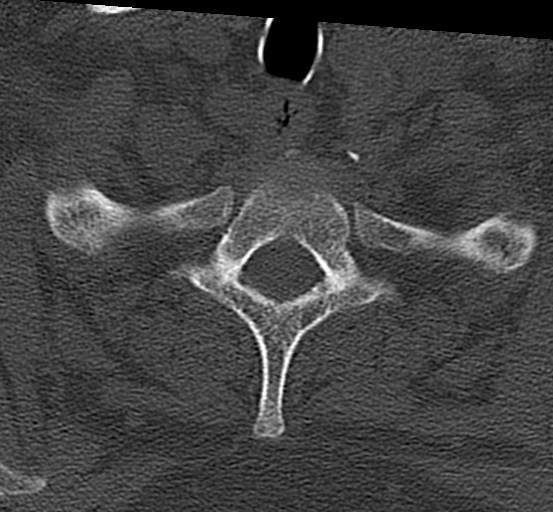
[im 27/81  bone]
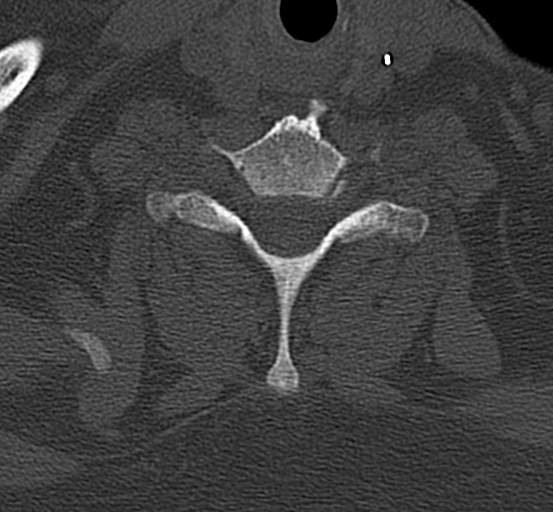
[im 41/81  bone]
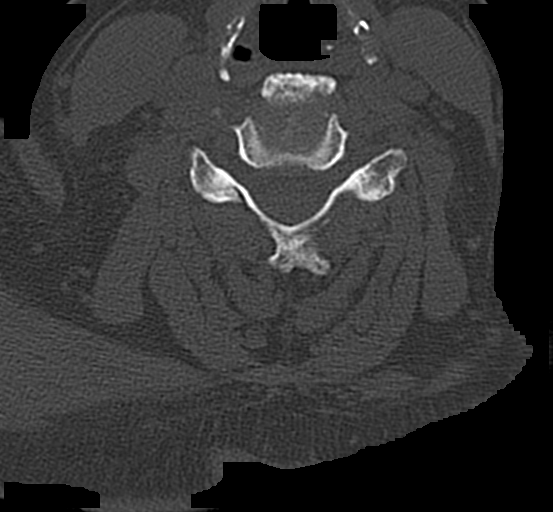
[im 54/81  bone]
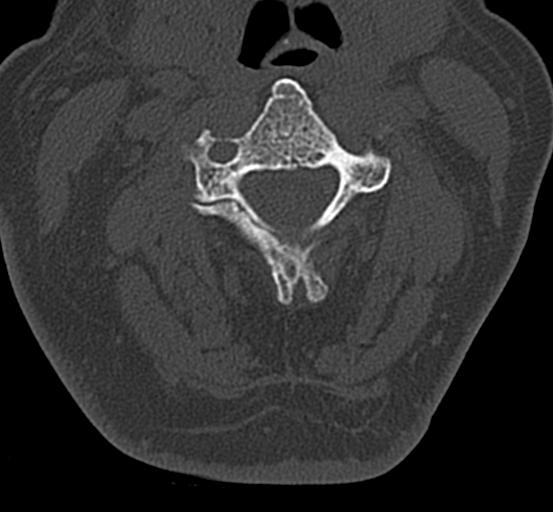
[im 67/81  soft-tissue]
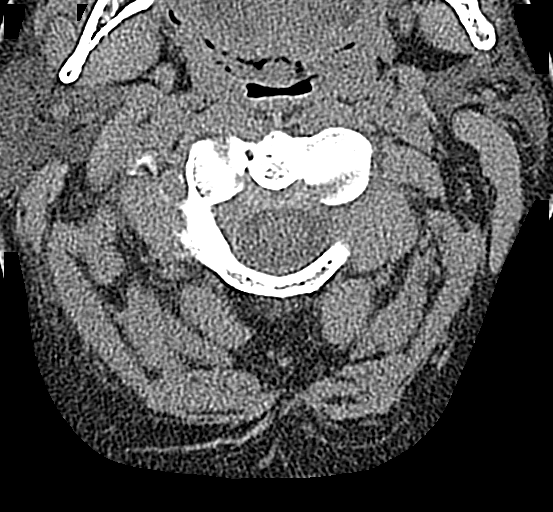
[im 67/81  bone]
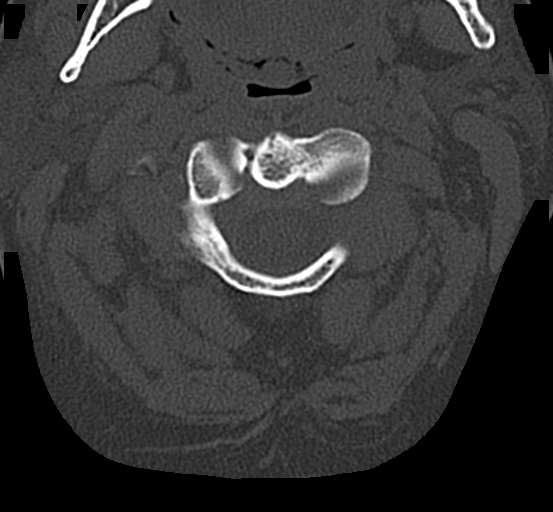

[13 of 33 positions shown; findings below may reference images not displayed]

FINDINGS: CT HEAD FINDINGS

Brain: No evidence of acute infarction, hemorrhage, cerebral edema,
mass, mass effect, or midline shift. No hydrocephalus or extra-axial
fluid collection. Periventricular white matter changes, likely the
sequela of chronic small vessel ischemic disease. Right pons, left
thalamic, and right basal ganglia lacunar infarcts. Remote left
parietal cortical infarct.

Vascular: No hyperdense vessel. Atherosclerotic calcifications in
the intracranial carotid and vertebral arteries.

Skull: Normal. Negative for fracture or focal lesion.

Sinuses/Orbits: Bubbly fluid in the sphenoid sinuses. Otherwise
clear. Status post bilateral lens replacements.

Other: The mastoid air cells are well aerated.

CT CERVICAL SPINE FINDINGS

Alignment: No listhesis.

Skull base and vertebrae: No acute fracture. No primary bone lesion
or focal pathologic process. Large anterior osteophytes.

Soft tissues and spinal canal: No prevertebral fluid or swelling. No
visible canal hematoma.

Disc levels: Relatively preserved disc heights. No high-grade spinal
canal stenosis or neural foraminal narrowing.

Upper chest: Negative.

Other: None.
IMPRESSION: 1.  No acute intracranial process.
2.  No acute fracture or traumatic listhesis in the cervical spine.

## 2023-04-21 IMAGING — CR DG CHEST 2V
2 series · 2 of 2 positions shown · non-contrast
Comparison: None.

CLINICAL DATA: Chest pain

EXAM:
CHEST - 2 VIEW

[chest pa]
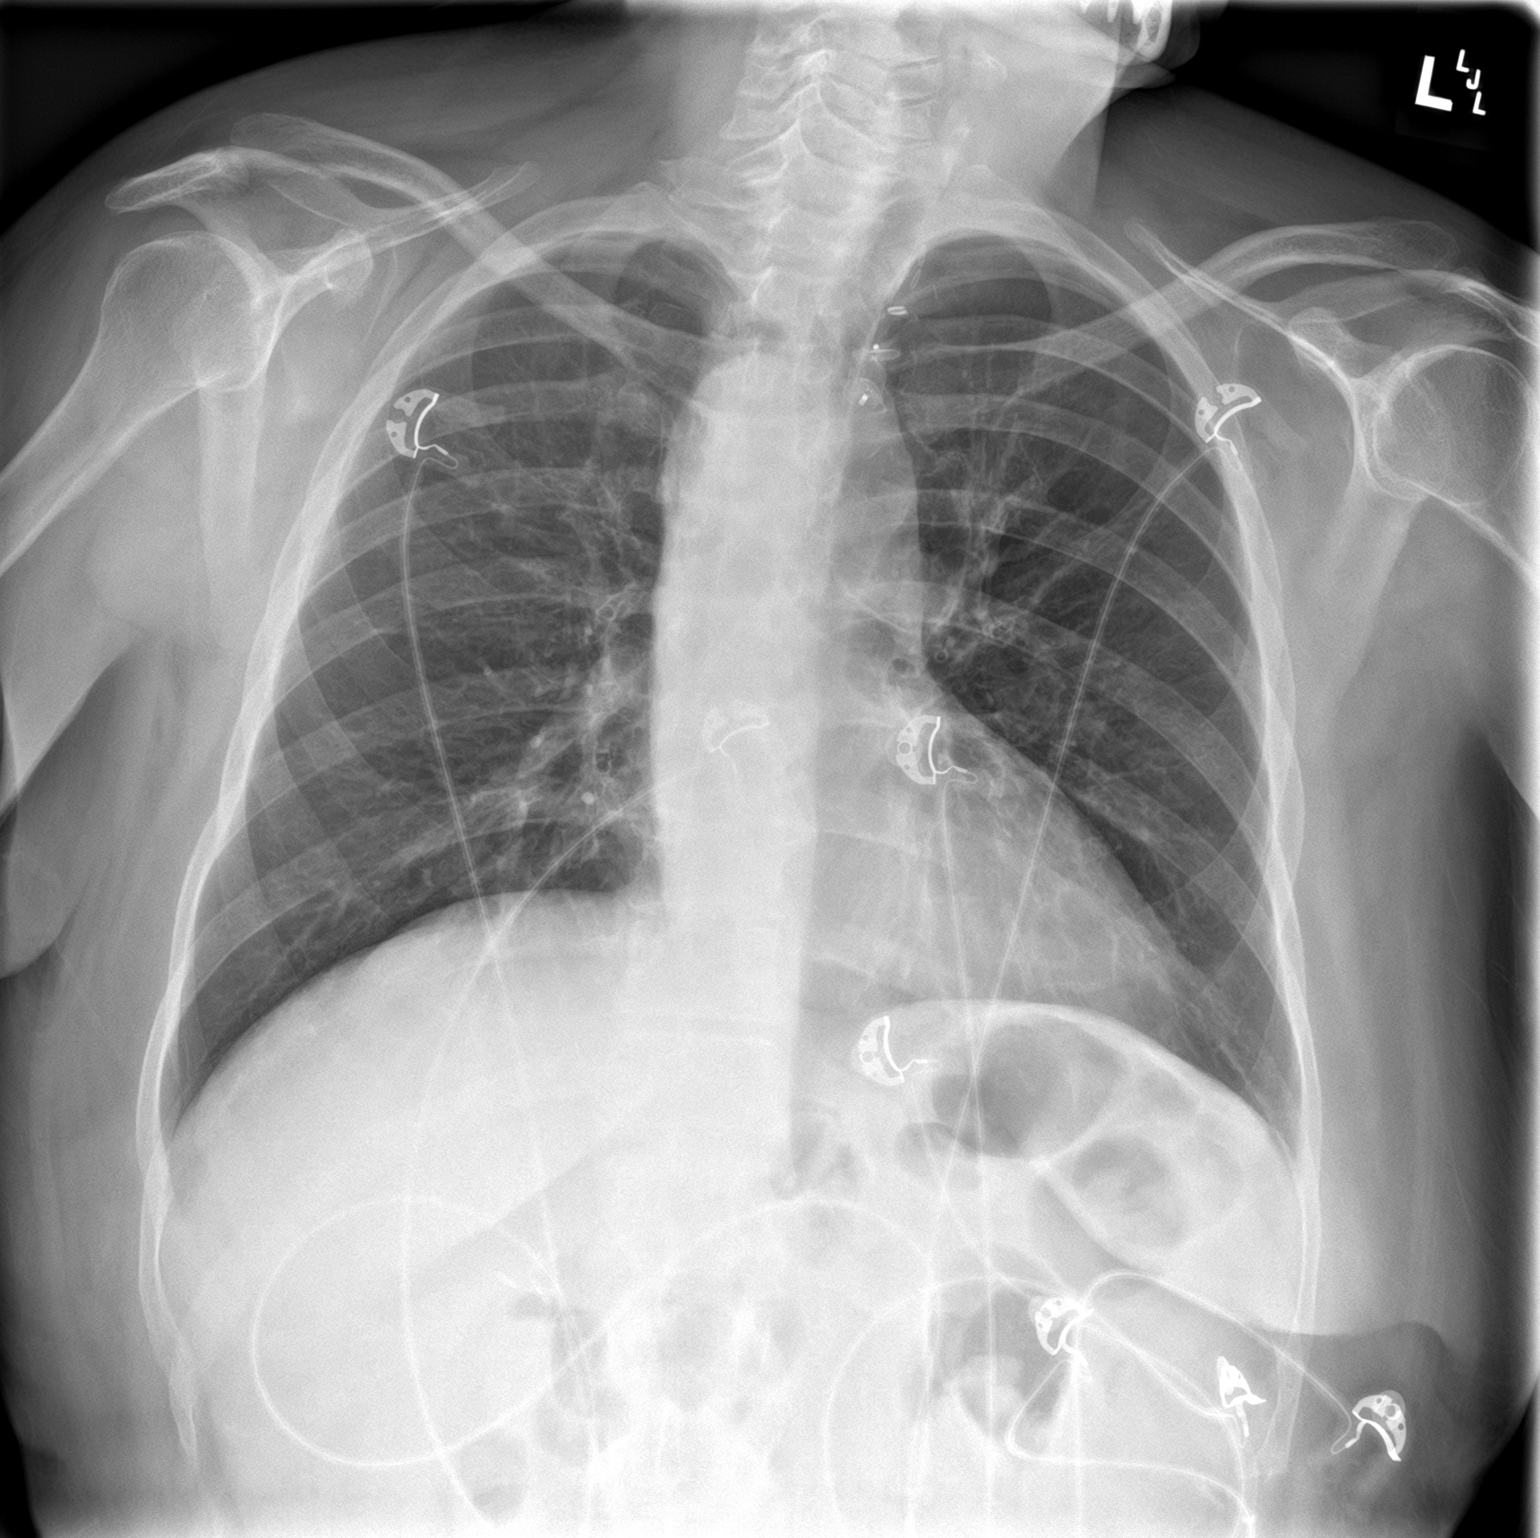

[chest lat]
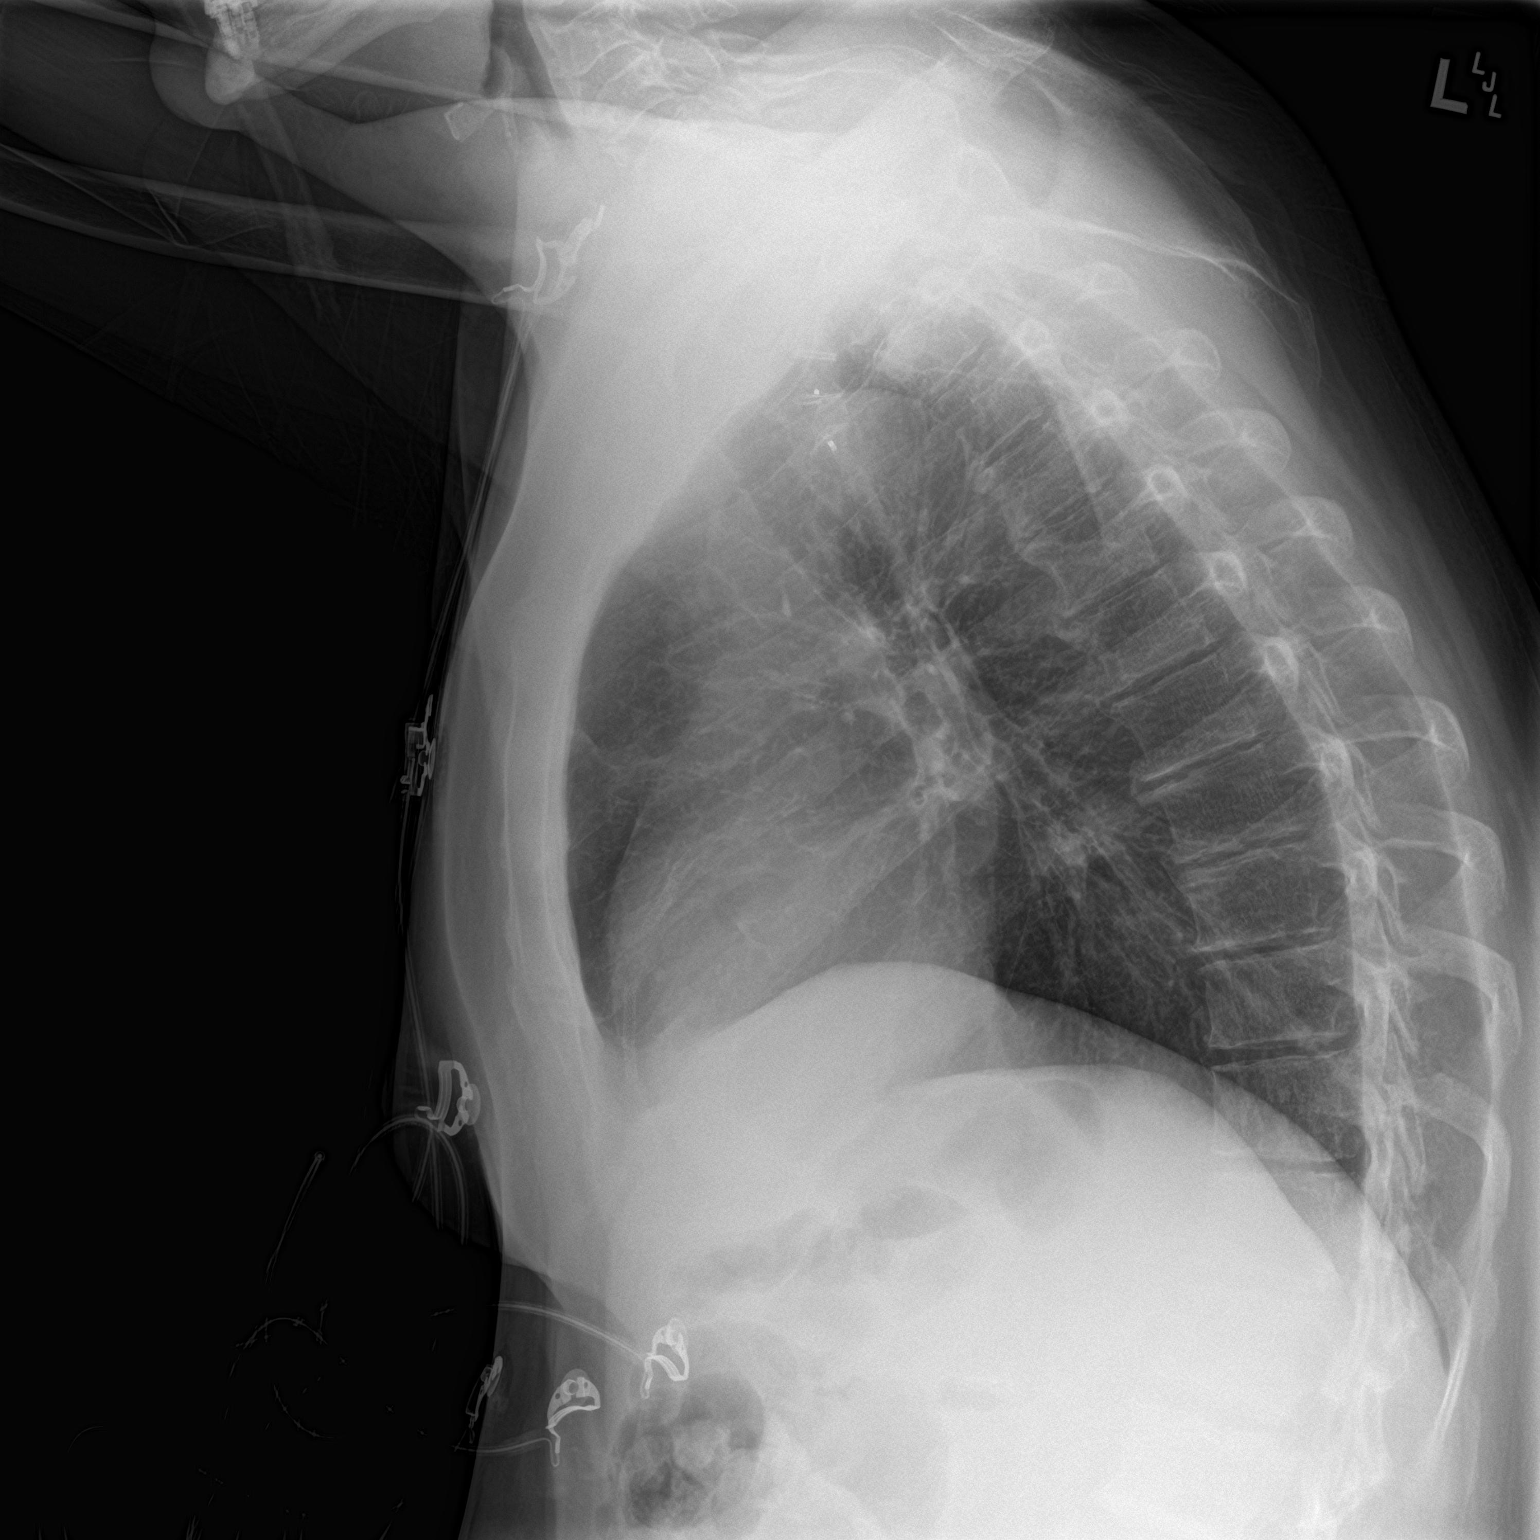

[2 of 2 positions shown; findings below may reference images not displayed]

FINDINGS: The cardiomediastinal silhouette is within normal limits. Unchanged
metallic densities overlying the upper mediastinum. No focal
airspace consolidation. No pleural effusion. No pneumothorax. There
is no acute osseous abnormality. Thoracic spondylosis.
IMPRESSION: No evidence of acute cardiopulmonary disease.

## 2023-04-23 IMAGING — CT CT ANGIO CHEST-ABD-PELV FOR DISSECTION W/ AND WO/W CM
2 of 7 series · 11 of 46 positions shown, 12 images · IV contrast (APPLIED)
Comparison: CTA chest 11/18/2019, abdomen and pelvis CT with
contrast 12/05/2020

CLINICAL DATA: Chest or back pain, aortic dissection suspected. The
patient has severe chest pain with blood pressure 213/112. No
ST-elevation on EKG.

EXAM:
CT ANGIOGRAPHY CHEST, ABDOMEN AND PELVIS
TECHNIQUE: Non-contrast CT of the chest was initially obtained.

[Series 5: arterial · axial · arterial · 0.89mm/px · z∈[-1653,-1125]mm · 8 of 332 slices shown, 9 images]
[im 34/332  soft-tissue]
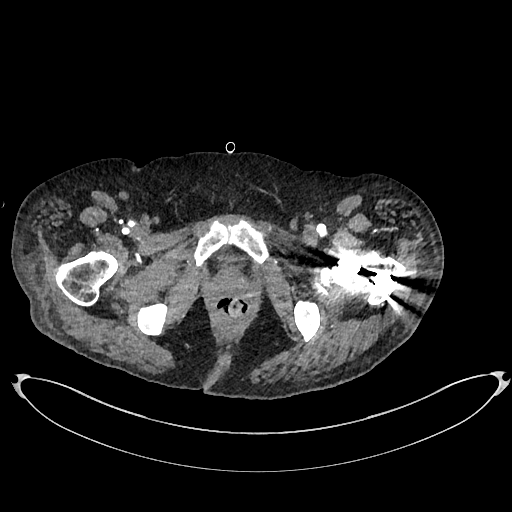
[im 34/332  bone]
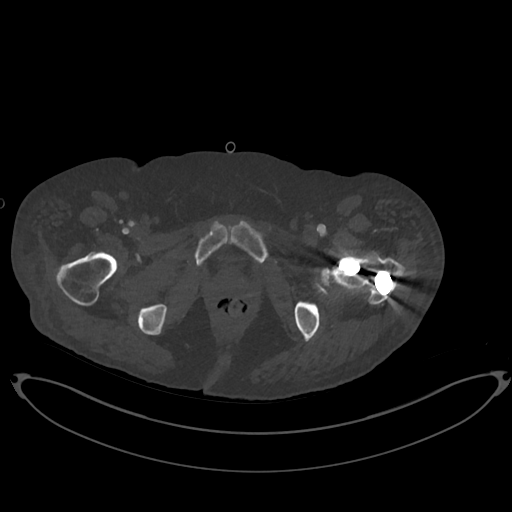
[im 67/332  soft-tissue]
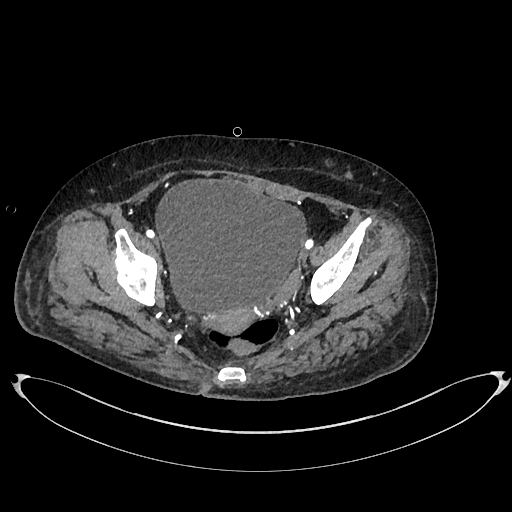
[im 100/332  soft-tissue]
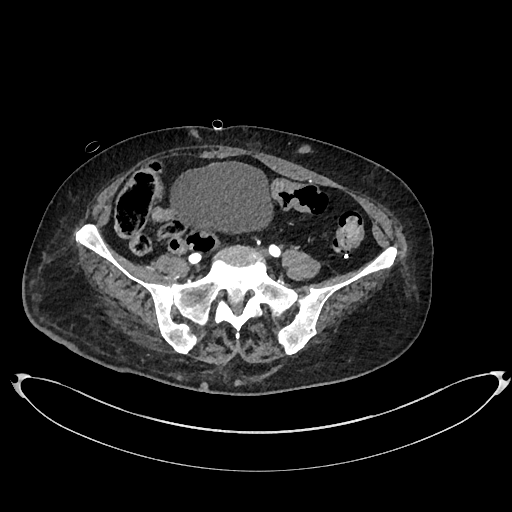
[im 149/332  soft-tissue]
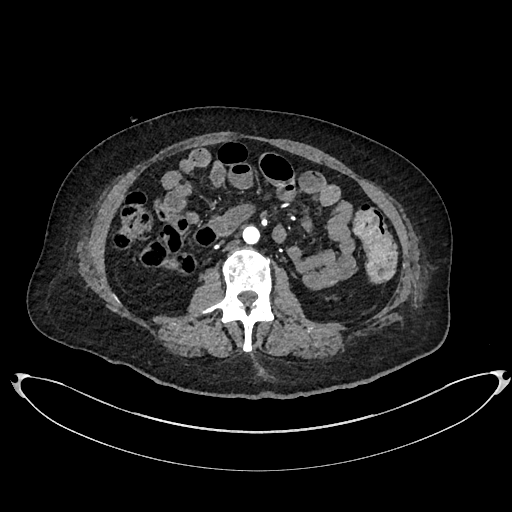
[im 183/332  soft-tissue]
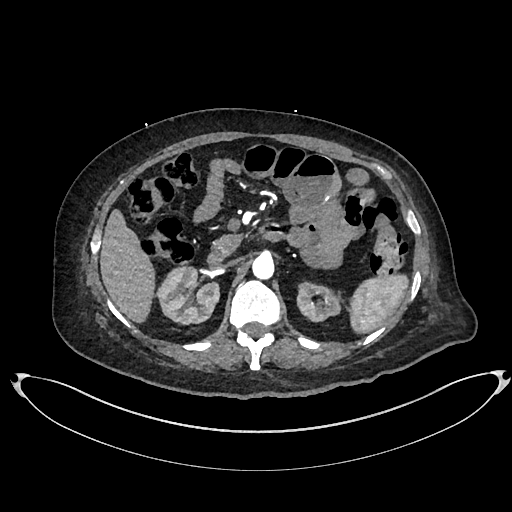
[im 232/332  soft-tissue]
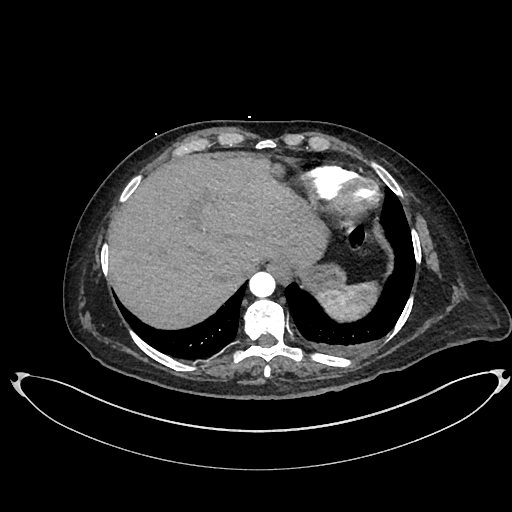
[im 265/332  soft-tissue]
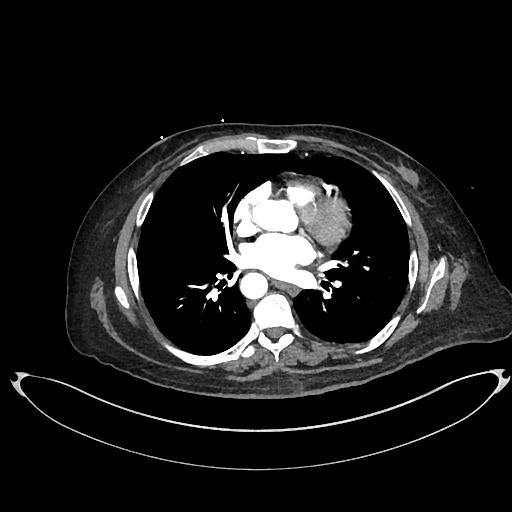
[im 298/332  soft-tissue]
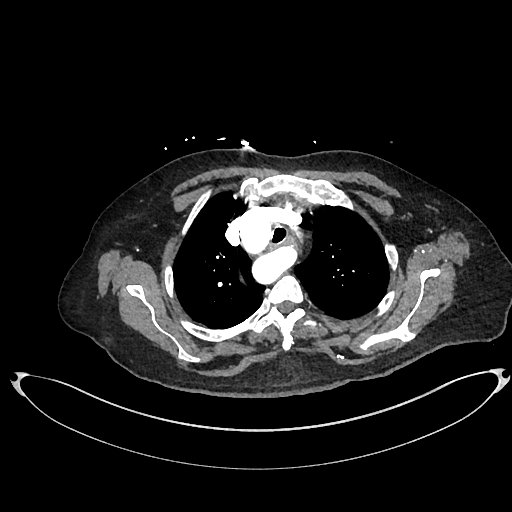

[Series 8: cor · coronal · 0.87mm/px · 3 of 151 slices shown]
[im 38/151  soft-tissue]
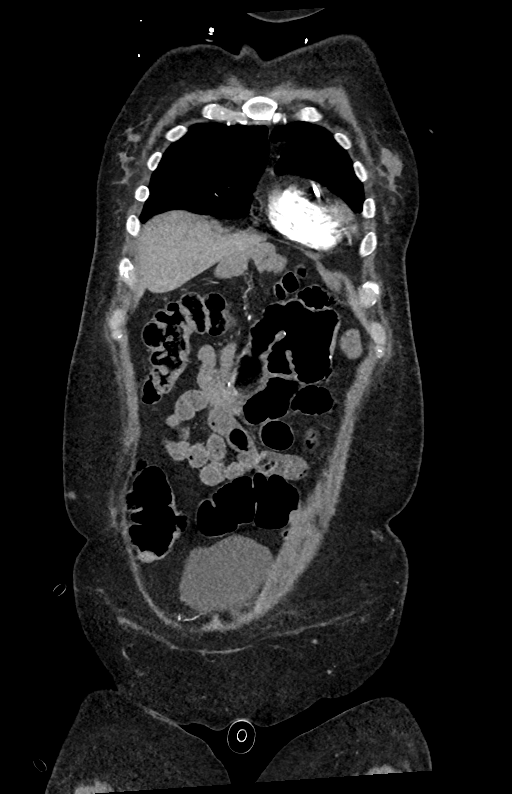
[im 76/151  soft-tissue]
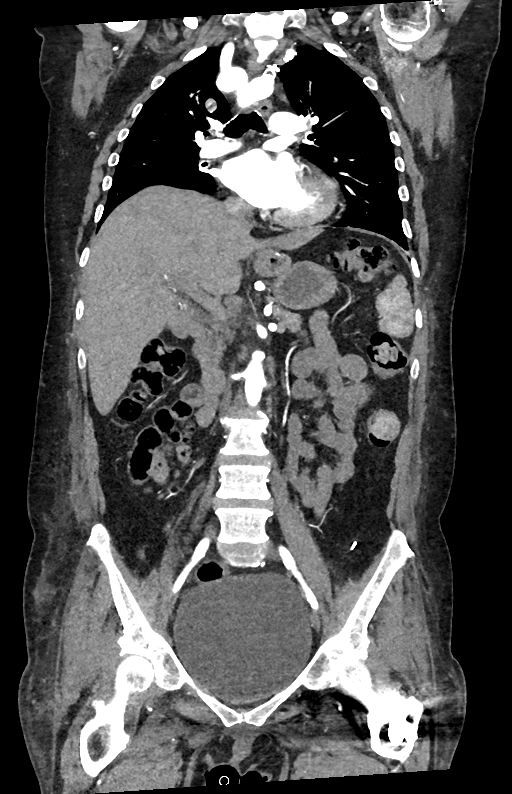
[im 113/151  soft-tissue]
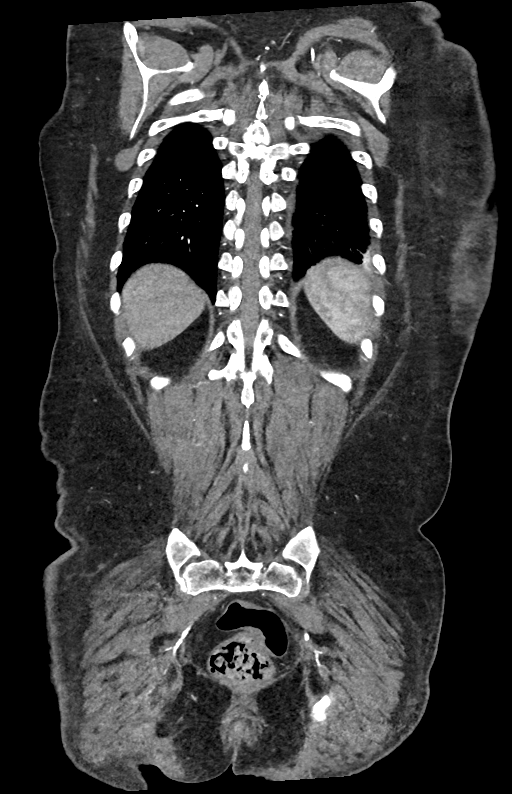

[11 of 46 positions shown; findings below may reference images not displayed]

Multidetector CT imaging through the chest, abdomen and pelvis was
performed using the standard protocol during bolus administration of
intravenous contrast. Multiplanar reconstructed images and MIPs were
obtained and reviewed to evaluate the vascular anatomy.

RADIATION DOSE REDUCTION: This exam was performed according to the
departmental dose-optimization program which includes automated
exposure control, adjustment of the mA and/or kV according to
patient size and/or use of iterative reconstruction technique.

CONTRAST:  75mL OMNIPAQUE IOHEXOL 350 MG/ML SOLN
FINDINGS: CTA CHEST FINDINGS

Cardiovascular: There is mild cardiomegaly with a left chamber
predominance and no findings of acute right heart strain. There is
no pericardial effusion. The pulmonary veins are decompressed. There
is patchy calcification in the LAD and circumflex coronary arteries
trace calcification in the right coronary artery.

Minimal aortic arch atherosclerosis with right-sided arch and
descending segment. There is no aortic hematoma, aneurysm,
dissection or penetrating ulcer or further significant plaques.

Again noted is an aberrant left subclavian artery, embolized with a
closure device with patent bypass graft from the posterolateral left
common carotid artery to the left subclavian artery with good
opacification of both subclavian arteries.

The pulmonary arteries are normal in caliber without evidence of
embolus to the segmental level.

Mediastinum/Nodes: The esophagus is again noted compressed between
the aberrant left subclavian artery and trachea, above this the
proximal thoracic esophagus is increasingly thickened.

Below this the esophagus is mildly thickened and patulous but no
more than previously. There is no thyroid mass, intrathoracic or
axillary adenopathy. There are old surgical clips in the left
superior mediastinum. The trachea is clear.

Lungs/Pleura: Minimal layering left and trace right pleural
effusions are new from prior studies. There is no pneumothorax.
Accessory azygous lobe and fissure again noted medial right apex.
Minimal centrilobular emphysema both lung apices.

There is chronic elevation of the right hemidiaphragm, unchanged.
Minimal atelectasis noted both posterior bases. There is scattered
ground-glass disease in the posterior aspect of the right upper lobe
and in the lateral aspect of the left upper lobe mid field, and in
the superior segment of the left lower lobe. Findings likely due to
multilobar pneumonitis. Similar ground-glass disease was noted
previously in the right upper lobe.

Musculoskeletal: There are degenerative disc changes, spondylosis
and Schmorl's nodes in the mid to lower thoracic spine but no
destructive osseous process. The ribcage is intact.

Review of the MIP images confirms the above findings.

CTA ABDOMEN AND PELVIS FINDINGS

VASCULAR

Aorta: There is minimal scattered calcific plaque without aneurysm,
dissection, vasculitis or stenosis, or penetrating ulcer.

Celiac: Patent without evidence of aneurysm, dissection, vasculitis
or significant stenosis.

SMA: Patent without evidence of aneurysm, dissection, vasculitis or
significant stenosis.

Renals: There are bilateral single renal arteries. There is a 40%
soft plaque stenosis in the proximal 5 mm of the right renal artery,
and 80-90% soft plaque stenosis in the proximal 9 mm of the left
renal artery. Both renal arteries are otherwise clear.

IMA: Patent without evidence of aneurysm, dissection, vasculitis or
significant stenosis.

Inflow: Patent without evidence of aneurysm, dissection, vasculitis
or significant stenosis. Scattered nonstenosing calcific plaques are
again noted in the common iliac and internal iliac arteries.

Veins: No obvious venous abnormality within the limitations of this
arterial phase study.

Review of the MIP images confirms the above findings.

NON-VASCULAR

Hepatobiliary: 20.5 cm length mildly steatotic liver without mass.
Status post cholecystectomy without biliary dilatation.

Pancreas: No focal abnormality or ductal dilatation.

Spleen: No focal abnormality or splenomegaly.

Adrenals/Urinary Tract: There is no adrenal mass. There is bilateral
renal cortical thinning and scarring, more on the left, both kidneys
measuring small length at 8.6 cm. There is no mass enhancement
common no urinary stone or obstruction. The bladder is moderately
distended with the dome reaching the level of L4-5. No focal wall
thickening is seen.

Stomach/Bowel: Old gastric bypass. The unopacified small and large
bowel without pathologic dilatation or wall thickening. An appendix
is not seen. Moderate fecal stasis. Uncomplicated left colonic
diverticulosis.

Lymphatic: No appreciable adenopathy in the abdomen or pelvis. There
are scattered subcutaneous opacities in the anterior abdominal wall
not seen previously, nonspecific but may be sites of subcutaneous
injection.

Reproductive: The uterus is intact.  The ovaries are normal in size.

Other: There is no free air, hemorrhage or fluid

Musculoskeletal: Evidence of prior left hip nailing. Degenerative
change lumbar spine. Moderate acquired spinal stenosis L4-5.

Review of the MIP images confirms the above findings.
IMPRESSION: 1. No acute CTA abnormality. No evidence of thoracic or abdominal
aortic aneurysm.
2. Small amounts of aortic atherosclerosis, right-sided aortic arch
with surgically occluded aberrant left subclavian artery, with
patent left common carotid to left subclavian bypass graft.
3. The only flow-limiting stenoses involve the most proximal renal
arteries, with soft plaque stenosis of 40% on the right and 80-90%
on the left.
4. Coronary artery calcifications heaviest in the LAD.
5. Chronic compression of the esophagus between the aberrant left
subclavian artery and trachea, with increasing thickening in the
more cephalad thoracic esophagus, chronic thickening and patulous
appearance in the more distal thoracic esophagus.
6. Scattered ground-glass opacities of the lungs most likely due to
multifocal pneumonitis including viral and atypical etiologies.
Follow-up study recommended to ensure clearing.
7. Bilateral renal cortical thinning and scarring with small renal
lengths.
8. Hepatic steatosis and enlargement.  Unchanged.
9. Constipation and diverticulosis.  Old gastric bypass.
10. Moderately distended bladder without focal abnormality.
11. Scattered subcutaneous opacities in the abdominal wall not seen
previously, possibly sites of subcutaneous injection but clinical
correlation advised.

## 2023-05-03 ENCOUNTER — Other Ambulatory Visit: Payer: Self-pay

## 2023-05-06 IMAGING — CT CT HEAD W/O CM
4 series · 15 of 47 positions shown, 17 images · non-contrast
Comparison: CT head and cervical spine 05/05/2021

CLINICAL DATA: Fall while transferring from wheelchair to recliner
this morning. Fluctuating orientation.



[Series 2: head bone · axial · 0.41mm/px · z∈[-163,-149]mm · 2 of 73 slices shown]
[im 8/73  bone]
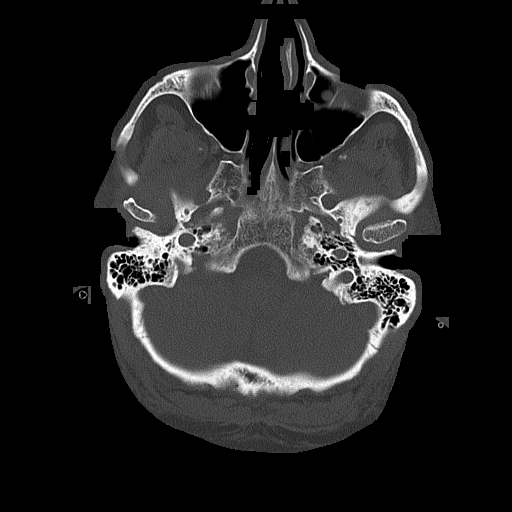
[im 15/73  bone]
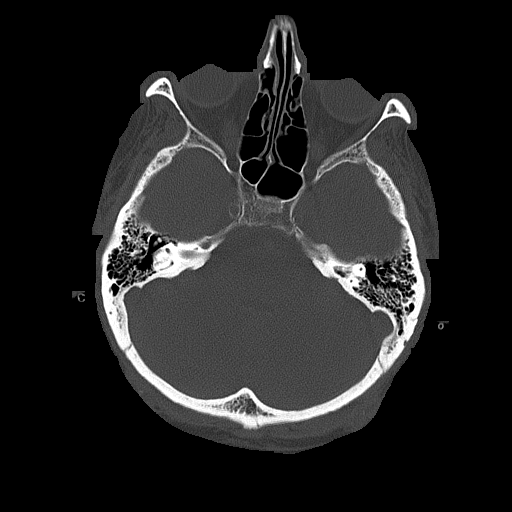

[Series 3: head wo · axial · 0.41mm/px · z∈[-162,-57]mm · 7 of 29 slices shown, 9 images]
[im 4/29  brain]
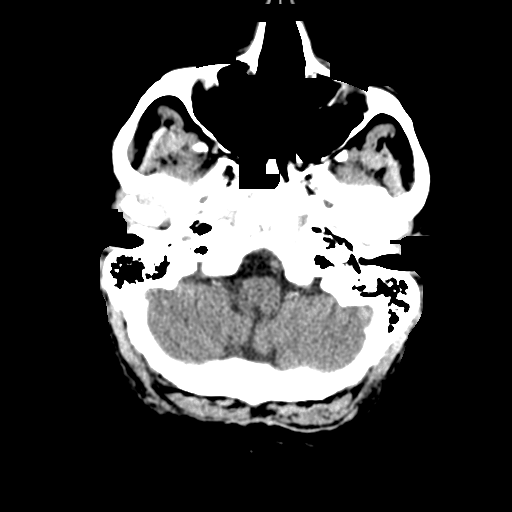
[im 4/29  bone]
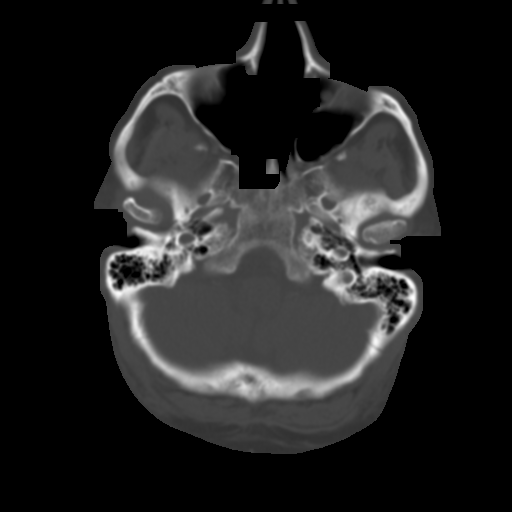
[im 8/29  brain]
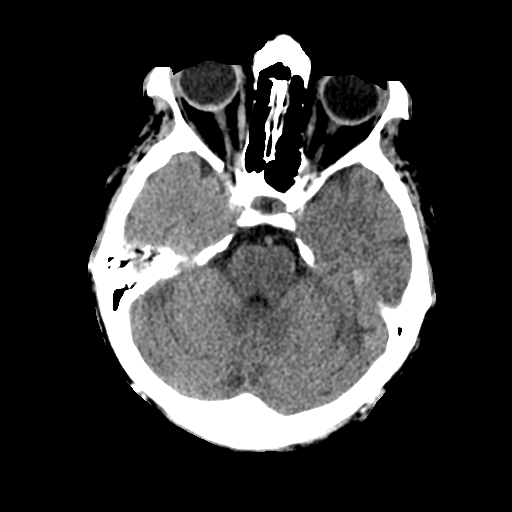
[im 11/29  brain]
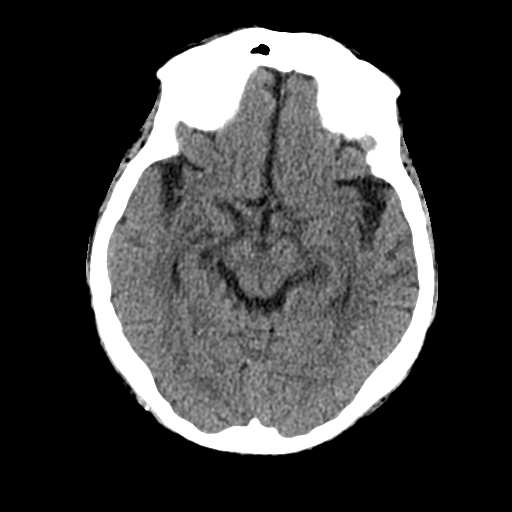
[im 15/29  brain]
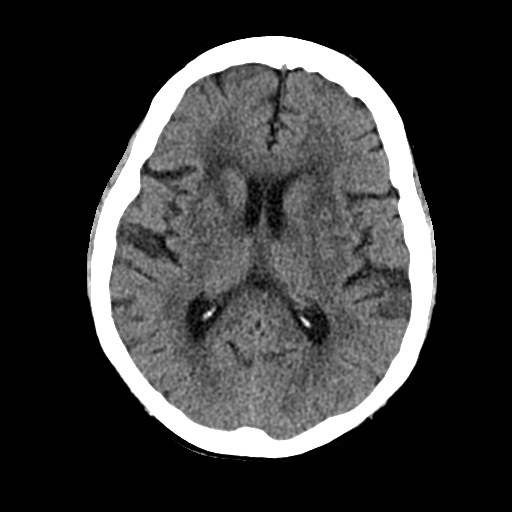
[im 18/29  brain]
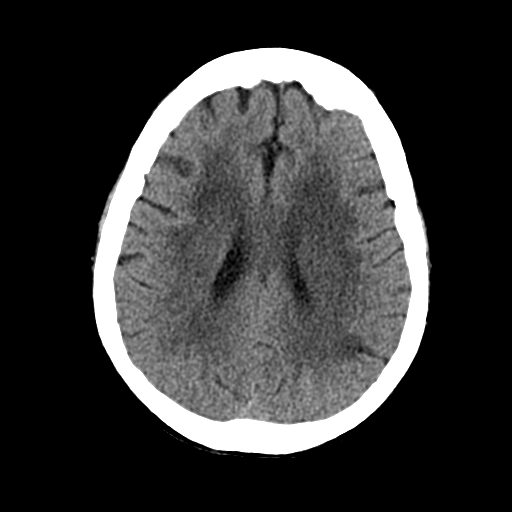
[im 18/29  bone]
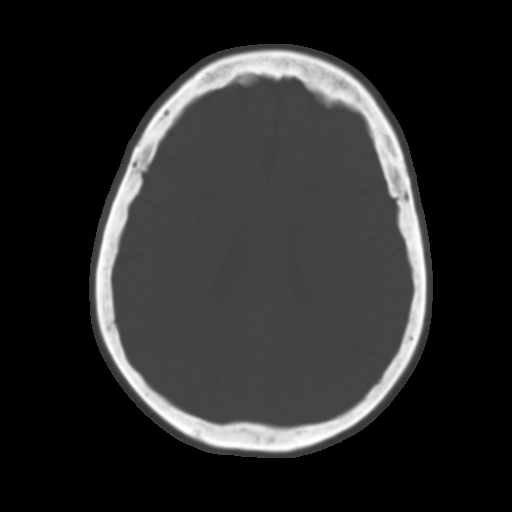
[im 22/29  brain]
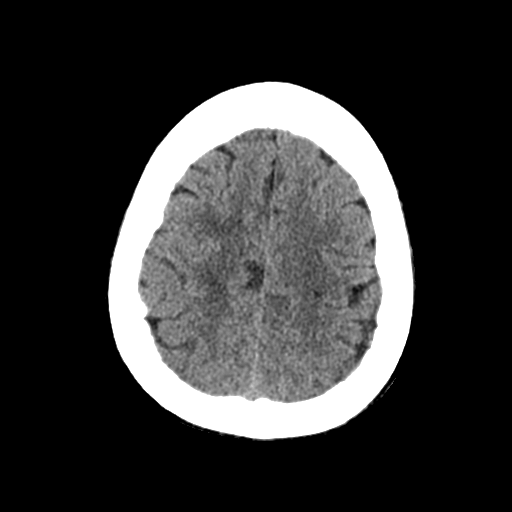
[im 25/29  brain]
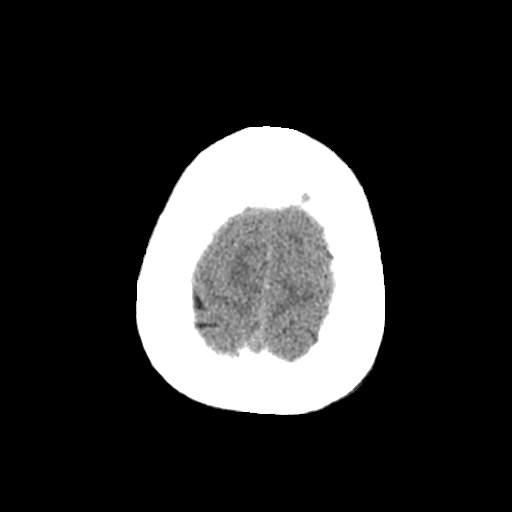

[Series 4: coronal soft tissue · coronal · 0.29mm/px · 3 of 68 slices shown]
[im 23/68  brain]
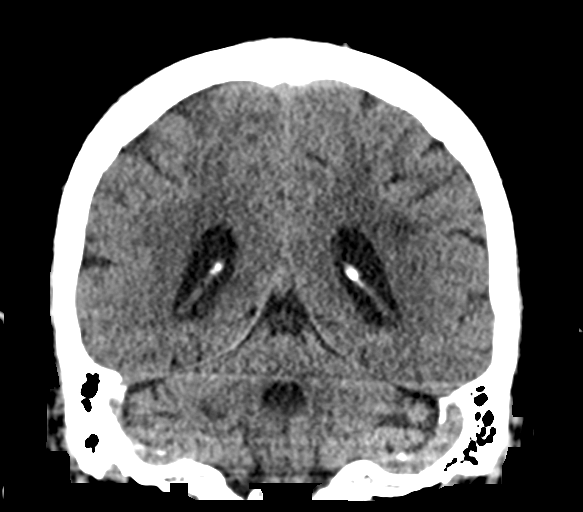
[im 30/68  brain]
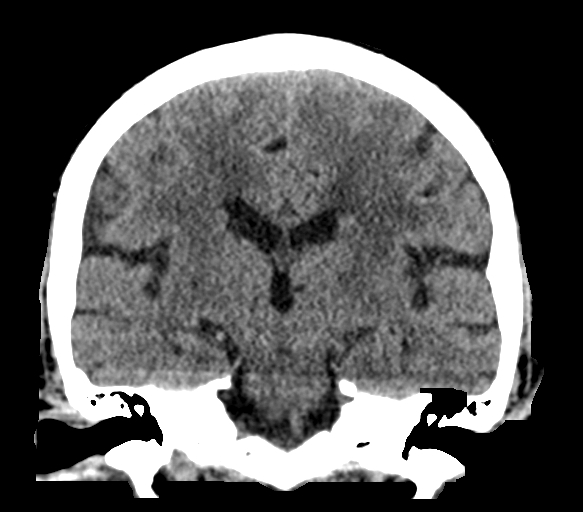
[im 38/68  brain]
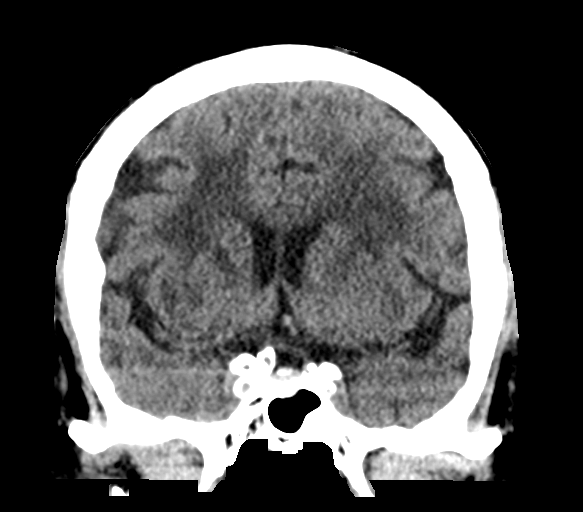

[Series 5: sagittal soft tissue · sagittal · 0.29mm/px · 3 of 57 slices shown]
[im 19/57  brain]
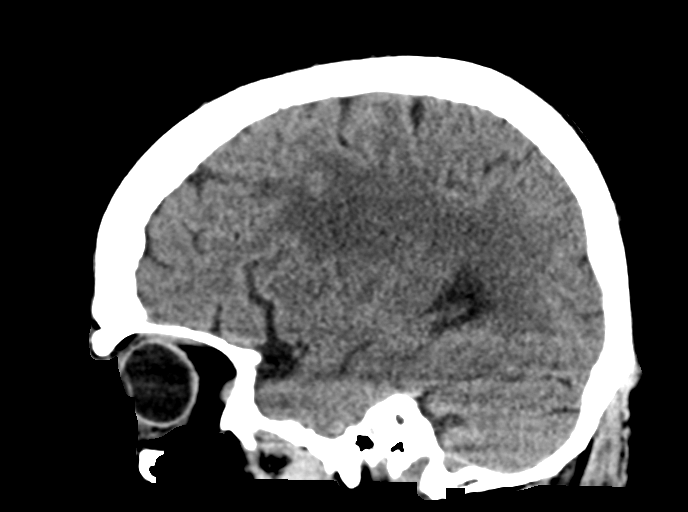
[im 29/57  brain]
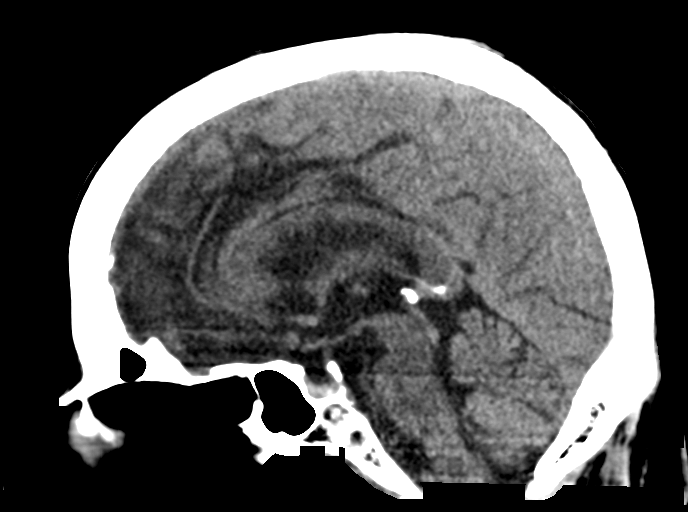
[im 38/57  brain]
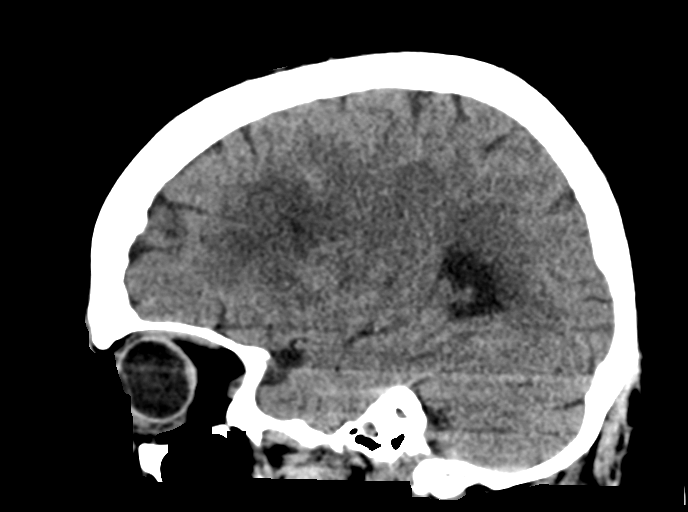

[15 of 47 positions shown; findings below may reference images not displayed]

FINDINGS: CT HEAD FINDINGS

Brain: No evidence of acute infarction, hemorrhage, hydrocephalus,
extra-axial collection or mass lesion/mass effect. Scattered
hypodensities in the periventricular and subcortical white matter,
consistent with chronic small-vessel ischemic changes. Chronic
infarcts in the right basal ganglia, left thalamus, and right pons.

Vascular: Vascular calcifications at the skull base. No hyperdense
vessel.

Skull: Normal. Negative for fracture or focal lesion.

Sinuses/Orbits: No acute finding. Paranasal sinuses and mastoid air
cells are clear.

Other: None.

CT CERVICAL SPINE FINDINGS

Alignment: Normal.

Skull base and vertebrae: No acute fracture. No primary bone lesion
or focal pathologic process.

Soft tissues and spinal canal: No prevertebral fluid or swelling. No
visible canal hematoma.

Disc levels: Severe osteoarthritis at the atlantodental joint. Mild
degenerative disc disease at all cervical levels with anterior
osteophyte formation. Disc space height is relatively preserved. No
significant facet arthropathy or neural foraminal stenosis.

Upper chest: No acute abnormality. Small amount of secretions noted
in the mildly patulous upper esophagus.

Other: None.
IMPRESSION: CT head: No acute intracranial abnormality. Stable exam compared to
05/05/2021.

CT cervical spine: No fracture or traumatic malalignment. Multilevel
mild degenerative disc disease.

## 2023-05-06 IMAGING — MR MR HEAD W/O CM
11 series · 48 of 48 positions shown · non-contrast
Comparison: MRI March 14, 2021.

CLINICAL DATA: Dizziness, persistent/recurrent, cardiac or vascular
cause suspected Mental status change, unknown cause Headache, new or
worsening (Age >= 50y)

EXAM:
MRI HEAD WITHOUT CONTRAST
TECHNIQUE: Multiplanar, multiecho pulse sequences of the brain and surrounding
structures were obtained without intravenous contrast.

[Series 5: ax dwi_tracew · axial · 3.0mm · 1.80mm/px · z∈[-74,+80]mm · 5 of 48 slices shown]
[im 1/48]
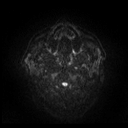
[im 12/48]
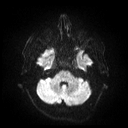
[im 24/48]
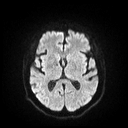
[im 36/48]
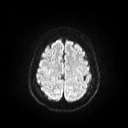
[im 48/48]
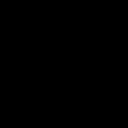

[Series 6: ax dwi_adc · axial · 3.0mm · 1.80mm/px · z∈[-74,+77]mm · 5 of 47 slices shown]
[im 1/47]
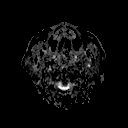
[im 12/47]
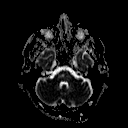
[im 24/47]
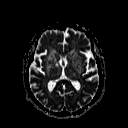
[im 35/47]
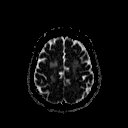
[im 47/47]
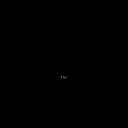

[Series 7: cor dwi_tracew · coronal · 5.0mm · 0.65mm/px · 3 of 40 slices shown]
[im 1/40]
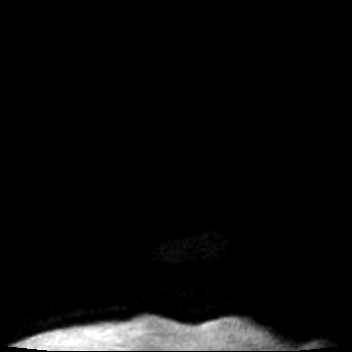
[im 20/40]
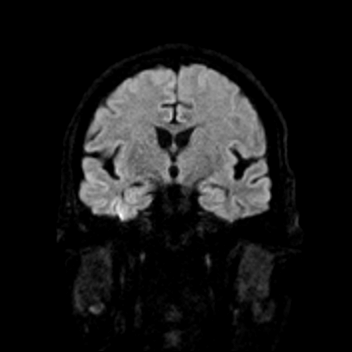
[im 40/40]
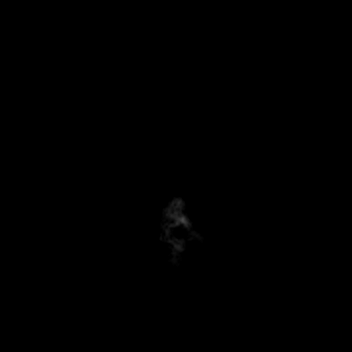

[Series 8: cor dwi_adc · coronal · 5.0mm · 0.65mm/px · 3 of 40 slices shown]
[im 1/40]
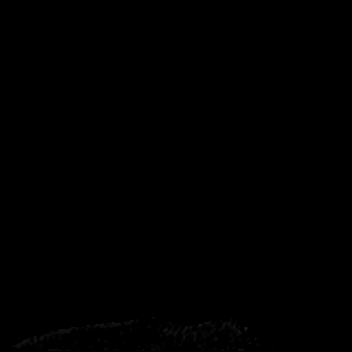
[im 20/40]
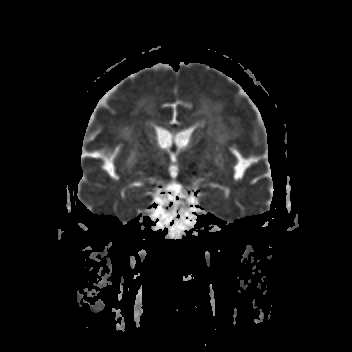
[im 40/40]
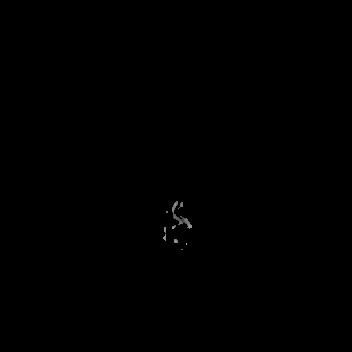

[Series 9: T1 · sagittal · 5.0mm · 0.62mm/px · 2 of 23 slices shown (1 of 2)]
[im 1/23]
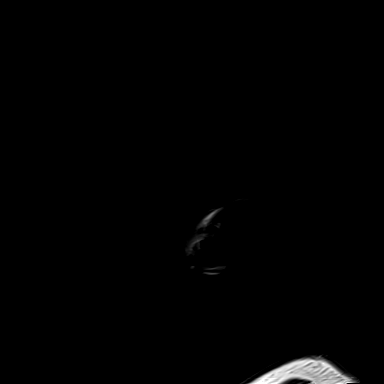
[im 23/23]
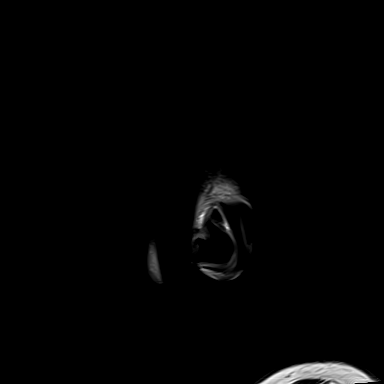

[Series 10: T2 · axial · 5.0mm · 0.86mm/px · z∈[-69,+74]mm · 2 of 25 slices shown (1 of 2)]
[im 1/25]
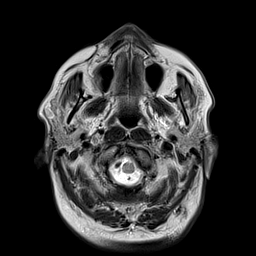
[im 25/25]
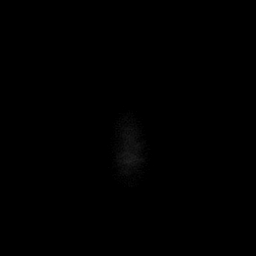

[Series 12: pha_images · axial · 3.0mm · 0.90mm/px · z∈[-70,+73]mm · 4 of 49 slices shown]
[im 1/49]
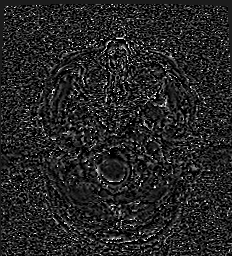
[im 17/49]
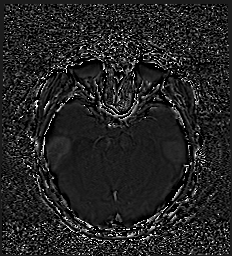
[im 33/49]
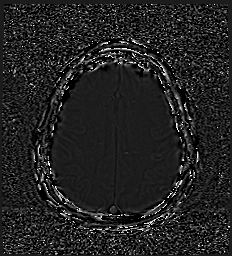
[im 49/49]
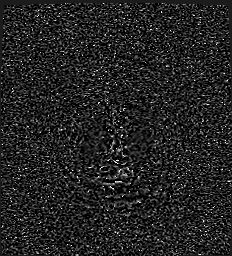

[Series 13: swi_images · axial · 3.0mm · 0.90mm/px · z∈[-73,+79]mm · 4 of 52 slices shown]
[im 1/52]
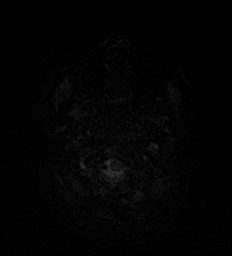
[im 18/52]
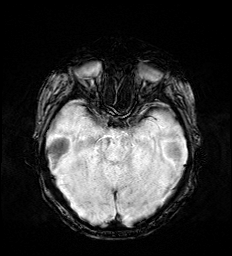
[im 35/52]
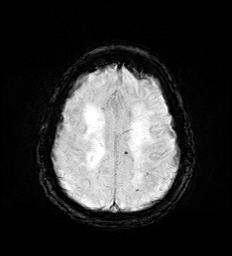
[im 52/52]
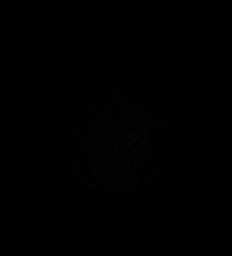

[Series 15: FLAIR · axial · 3.0mm · 0.69mm/px · z∈[-78,+83]mm · 4 of 54 slices shown]
[im 1/54]
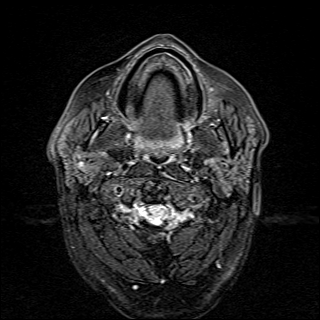
[im 18/54]
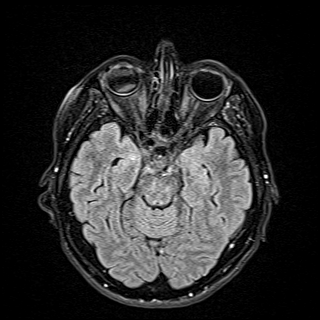
[im 36/54]
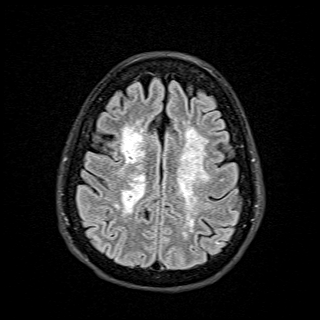
[im 54/54]
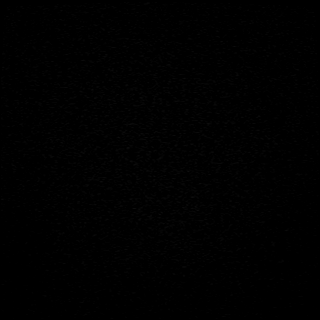

[Series 16: T1 · axial · 1.0mm · 0.98mm/px · z∈[-83,+91]mm · 14 of 172 slices shown (2 of 2)]
[im 1/172]
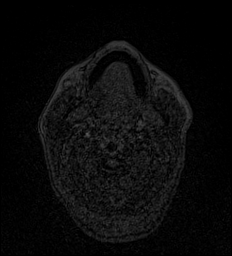
[im 14/172]
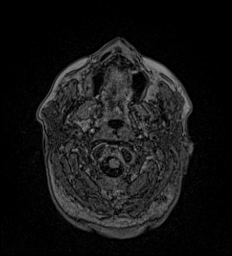
[im 27/172]
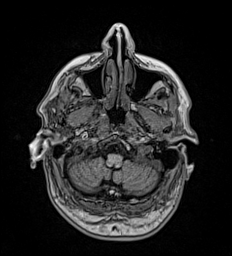
[im 40/172]
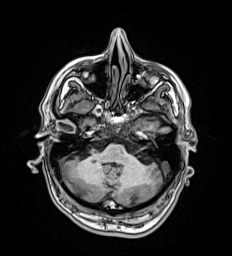
[im 53/172]
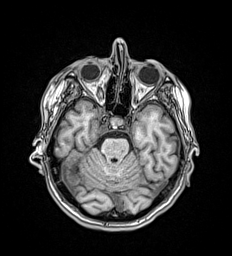
[im 66/172]
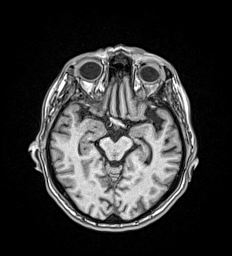
[im 79/172]
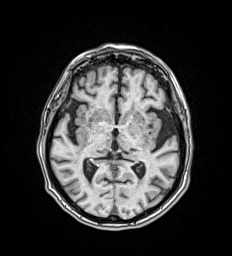
[im 93/172]
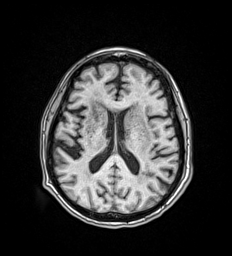
[im 106/172]
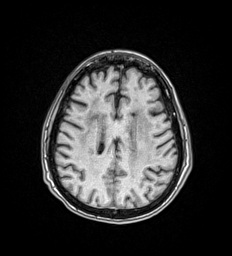
[im 119/172]
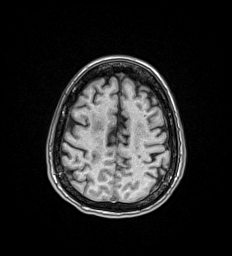
[im 132/172]
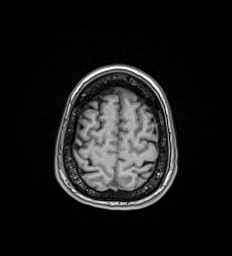
[im 145/172]
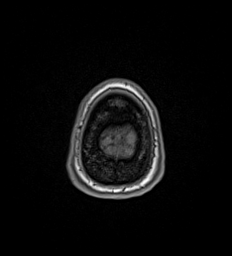
[im 158/172]
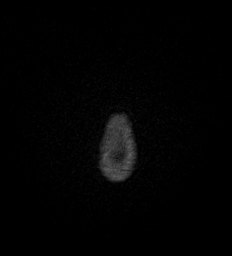
[im 172/172]
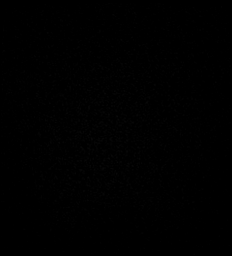

[Series 17: T2 · coronal · 5.0mm · 0.86mm/px · 2 of 29 slices shown (2 of 2)]
[im 1/29]
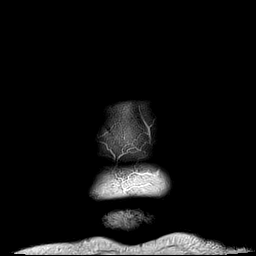
[im 29/29]
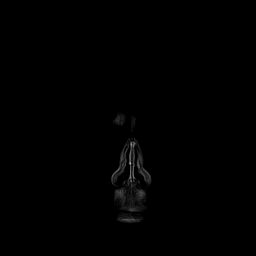

[48 of 48 positions shown; findings below may reference images not displayed]

FINDINGS: Brain: Decreased conspicuity of mild DWI hyperintensity in the left
centrum semiovale with correlate ADC hyperintensity, compatible with
prior infarcts. No evidence of acute infarct. No acute hemorrhage,
hydrocephalus, mass lesion, midline shift, or extra-axial fluid
collection. Chronic left parietal infarct. Similar carotid worklist
for ischemic disease and prominent perivascular spaces of the
central gray nuclei in central white matter. Similar chronic
infarcts in the pons and cerebellum. Mild scattered punctate foci of
susceptibility artifact, compatible with prior microhemorrhages.

Vascular: Major arterial flow voids maintained skull base

Skull and upper cervical spine: Normal marrow signal.

Sinuses/Orbits: Clear sinuses. No acute orbital findings.

Other: No mastoid effusions.
IMPRESSION: 1. No evidence of acute intracranial abnormality.
2. Age-advanced chronic microvascular ischemic disease and remote
infarcts, as detailed above.

## 2023-05-06 IMAGING — CR DG CHEST 1V PORT
1 series · 1 of 1 positions shown · non-contrast
Comparison: May 08, 2021.

CLINICAL DATA: Altered mental status.

EXAM:
PORTABLE CHEST 1 VIEW

[chest ap]
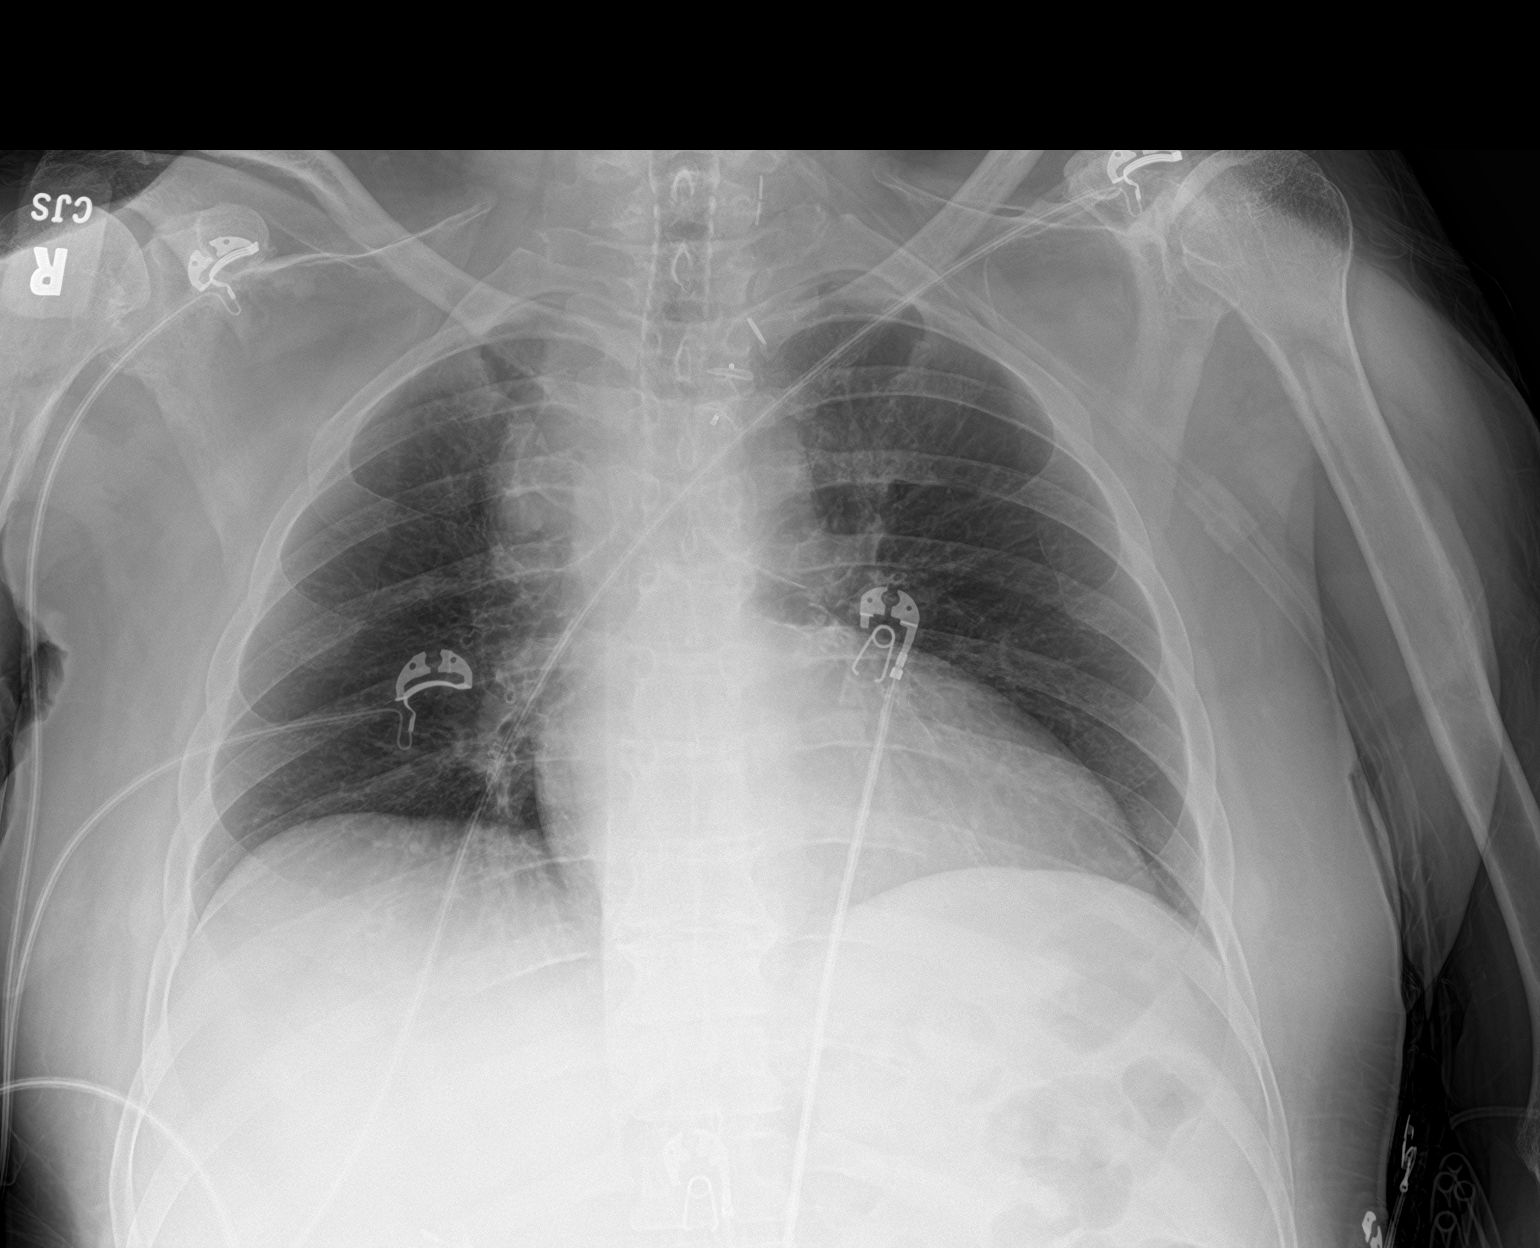

[1 of 1 positions shown; findings below may reference images not displayed]

FINDINGS: The heart size and mediastinal contours are within normal limits.
Both lungs are clear. The visualized skeletal structures are
unremarkable.
IMPRESSION: No active disease.

## 2023-05-08 IMAGING — MR MR ABDOMEN WO/W CM MRCP
21 of 23 series · 45 of 48 positions shown · IV contrast (7.5ml Gadavist)
Comparison: None.

CLINICAL DATA: Persistent hypoglycemia, elevated insulin level,
evaluate for insulinoma

EXAM:
MRI ABDOMEN WITHOUT AND WITH CONTRAST (INCLUDING MRCP)
TECHNIQUE: Multiplanar multisequence MR imaging of the abdomen was performed
both before and after the administration of intravenous contrast.
Heavily T2-weighted images of the biliary and pancreatic ducts were
obtained, and three-dimensional MRCP images were rendered by post
processing.
CONTRAST:  7.5mL GADAVIST GADOBUTROL 1 MMOL/ML IV SOLN

[Series 3: T2 · coronal · 6.0mm · 1.19mm/px · 1 of 30 slices shown (1 of 2)]
[im 1/30]
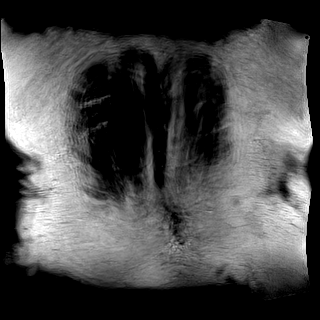

[Series 4: T2 · axial · 6.0mm · 1.19mm/px · 1 of 35 slices shown (2 of 2)]
[im 1/35]
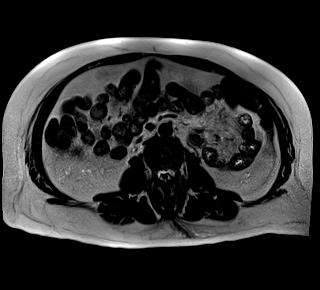

[Series 5: T1 · axial · 3.0mm · 1.19mm/px · z∈[-39,+198]mm · 2 of 80 slices shown (1 of 2)]
[im 1/80]
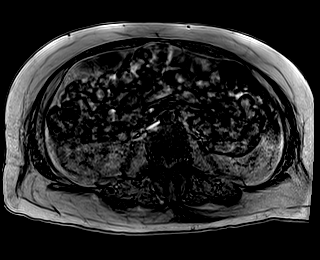
[im 80/80]
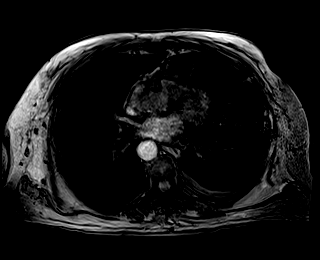

[Series 6: T1 · axial · 3.0mm · 1.19mm/px · z∈[-39,+198]mm · 2 of 80 slices shown (2 of 2)]
[im 1/80]
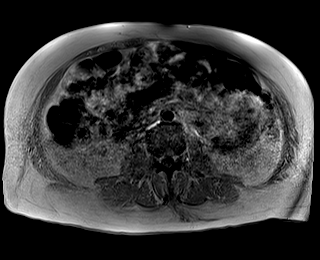
[im 80/80]
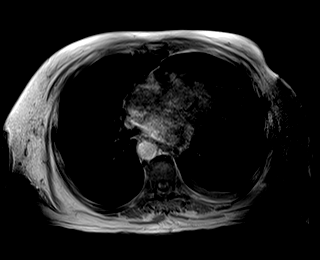

[Series 7: bSSFP · axial · 6.0mm · 0.74mm/px · 1 of 35 slices shown]
[im 1/35]
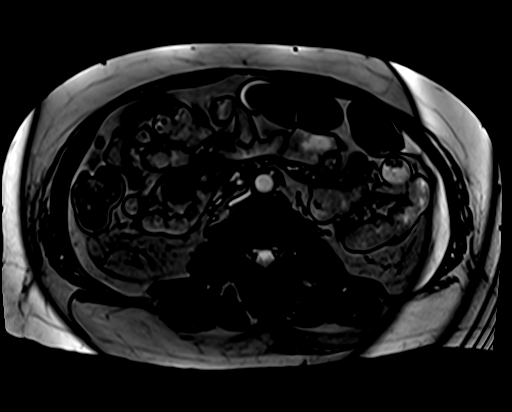

[Series 9: T2 fat-sat · axial · 6.0mm · 1.19mm/px · 1 of 36 slices shown]
[im 1/36]
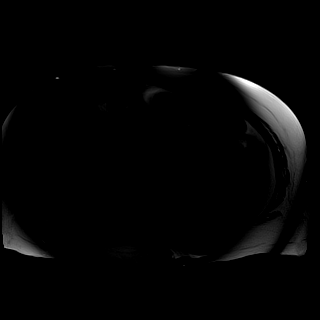

[Series 11: ax dwi_tracew · axial · 6.0mm · 1.42mm/px · z∈[-47,+205]mm · 3 of 108 slices shown]
[im 1/108]
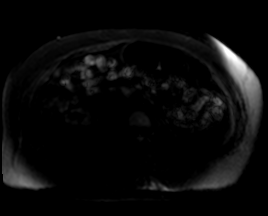
[im 54/108]
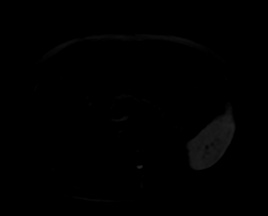
[im 108/108]
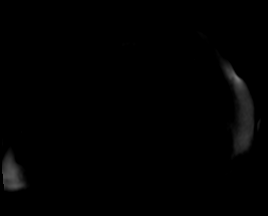

[Series 12: ax dwi_adc · axial · 6.0mm · 1.42mm/px · 1 of 36 slices shown]
[im 1/36]
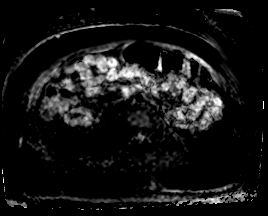

[Series 16: MRCP · coronal · 3.0mm · 1.12mm/px · 1 of 17 slices shown (1 of 2)]
[im 1/17]
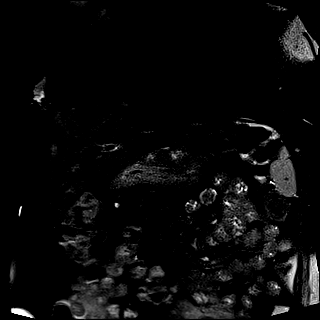

[Series 17: radials · coronal · 50.0mm · 0.78mm/px · 1 of 5 slices shown]
[im 1/5]
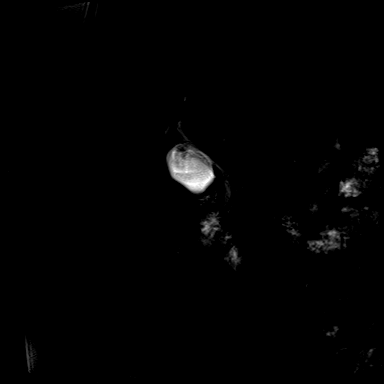

[Series 18: T1 dynamic fat-sat · axial · non-contrast · 3.0mm · 1.19mm/px · z∈[-29,+184]mm · 3 of 72 slices shown (1 of 5)]
[im 1/72]
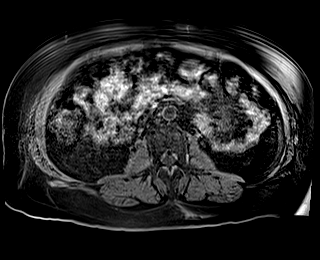
[im 36/72]
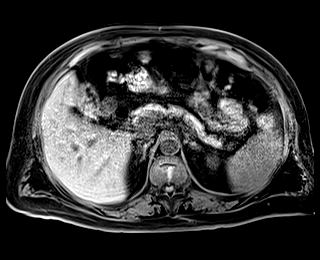
[im 72/72]
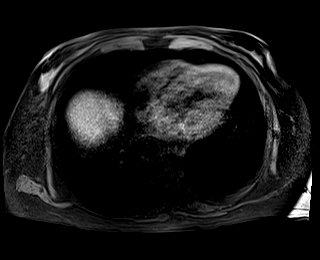

[Series 19: T1 dynamic fat-sat post-contrast · axial · 3.0mm · 1.19mm/px · z∈[-29,+184]mm · 3 of 72 slices shown (1 of 4)]
[im 1/72]
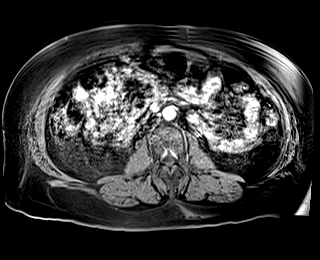
[im 36/72]
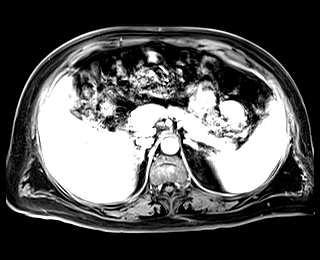
[im 72/72]
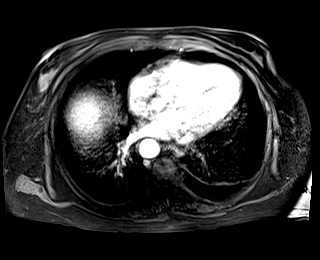

[Series 20: T1 dynamic fat-sat · axial · 3.0mm · 1.19mm/px · z∈[-29,+184]mm · 3 of 72 slices shown (2 of 5)]
[im 1/72]
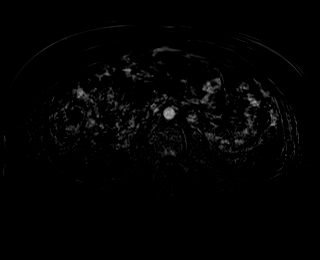
[im 36/72]
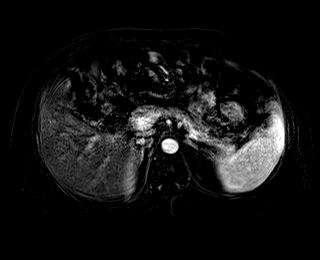
[im 72/72]
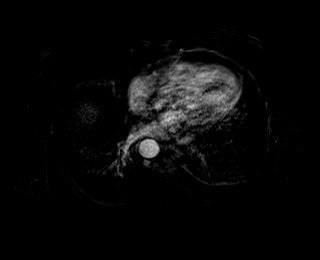

[Series 21: T1 dynamic fat-sat post-contrast · axial · 3.0mm · 1.19mm/px · z∈[-29,+184]mm · 3 of 72 slices shown (2 of 4)]
[im 1/72]
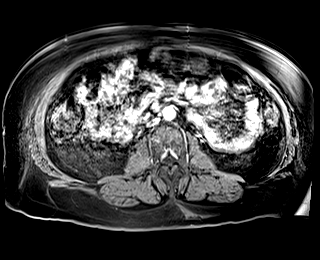
[im 36/72]
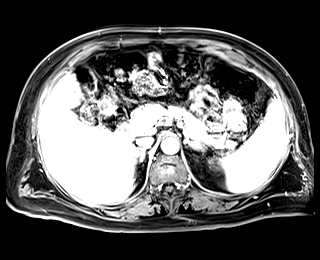
[im 72/72]
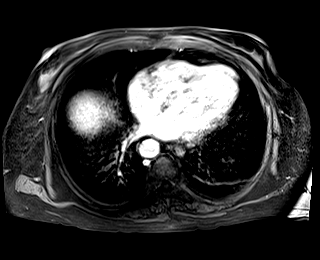

[Series 22: T1 dynamic fat-sat · axial · 3.0mm · 1.19mm/px · z∈[-29,+184]mm · 3 of 72 slices shown (3 of 5)]
[im 1/72]
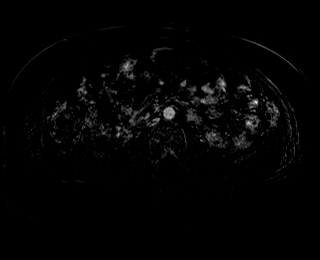
[im 36/72]
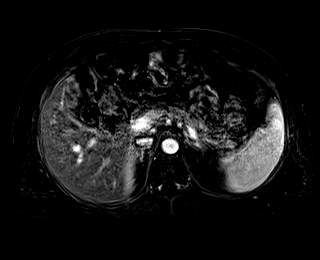
[im 72/72]
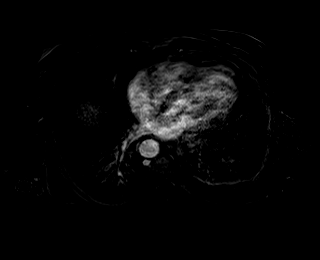

[Series 23: T1 dynamic fat-sat post-contrast · axial · 3.0mm · 1.19mm/px · z∈[-29,+184]mm · 3 of 72 slices shown (3 of 4)]
[im 1/72]
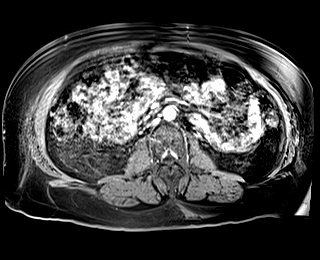
[im 36/72]
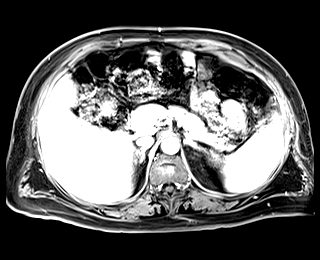
[im 72/72]
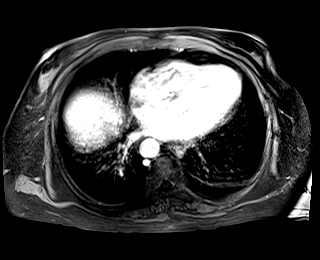

[Series 24: T1 dynamic fat-sat · axial · 3.0mm · 1.19mm/px · z∈[-29,+184]mm · 3 of 72 slices shown (4 of 5)]
[im 1/72]
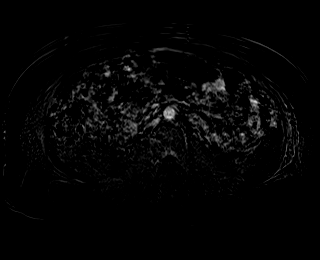
[im 36/72]
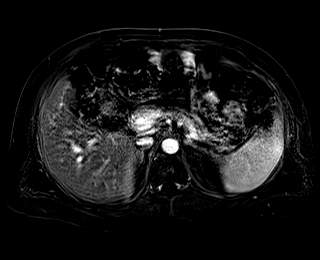
[im 72/72]
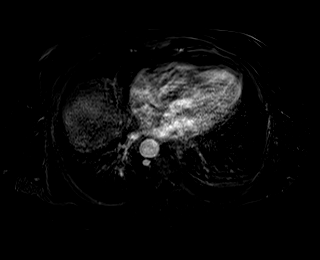

[Series 25: T1 dynamic post-contrast · coronal · 3.0mm · 1.31mm/px · 3 of 72 slices shown]
[im 1/72]
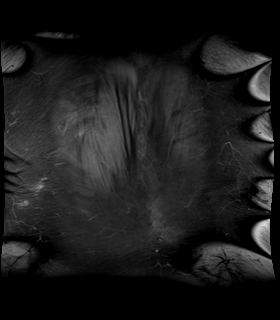
[im 36/72]
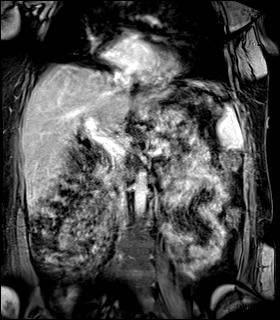
[im 72/72]
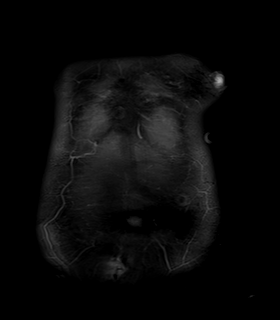

[Series 26: T1 dynamic fat-sat post-contrast · axial · 3.0mm · 1.19mm/px · z∈[-29,+184]mm · 3 of 72 slices shown (4 of 4)]
[im 1/72]
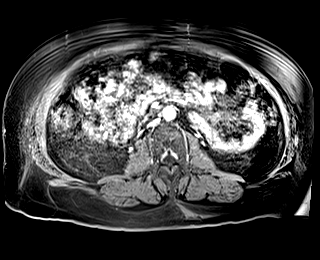
[im 36/72]
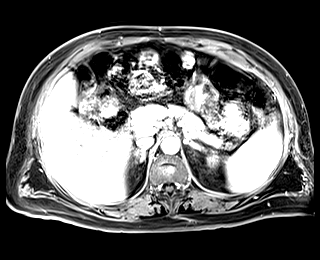
[im 72/72]
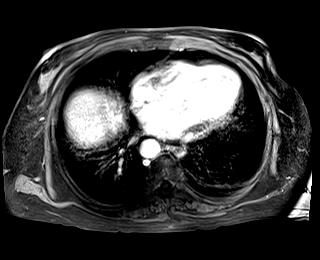

[Series 27: T1 dynamic fat-sat · axial · 3.0mm · 1.19mm/px · z∈[-29,+184]mm · 3 of 72 slices shown (5 of 5)]
[im 1/72]
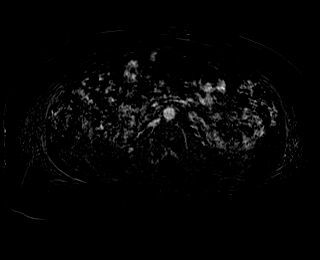
[im 36/72]
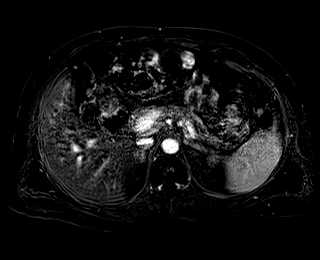
[im 72/72]
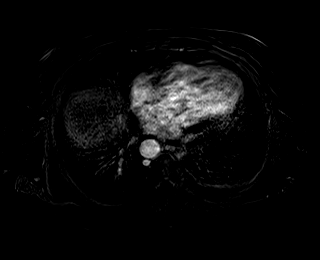

[Series 1040: MRCP · axial · 479.6mm · 0.47mm/px · 1 of 15 slices shown (2 of 2)]
[im 1/15]
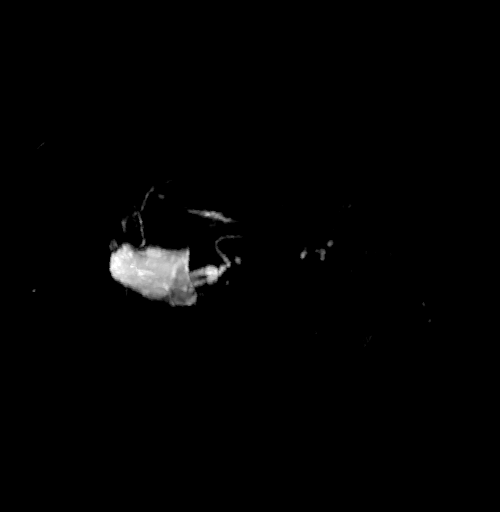

[45 of 48 positions shown; findings below may reference images not displayed]

FINDINGS: Lower chest: Small left pleural effusion.

Hepatobiliary: No mass or other parenchymal abnormality identified.
Status post cholecystectomy. No biliary ductal dilatation.

Pancreas: No mass, inflammatory changes, or other parenchymal
abnormality identified.No pancreatic ductal dilatation.

Spleen:  Within normal limits in size and appearance.

Adrenals/Urinary Tract: Normal adrenal glands. No renal masses or
suspicious contrast enhancement identified. No evidence of
hydronephrosis.

Stomach/Bowel: Visualized portions within the abdomen are
unremarkable.

Vascular/Lymphatic: No pathologically enlarged lymph nodes
identified. No abdominal aortic aneurysm demonstrated.

Other:  None.

Musculoskeletal: No suspicious osseous lesions identified.
IMPRESSION: 1. No evidence of pancreatic or hepatic mass or suspicious contrast
enhancement to suggest macroscopic insulinoma.
2. Small left pleural effusion.
3. Status post cholecystectomy. No biliary ductal dilatation.

## 2023-05-15 IMAGING — CT CT ANGIO CHEST-ABD-PELV FOR DISSECTION W/ AND WO/W CM
2 of 7 series · 14 of 46 positions shown, 16 images · IV contrast (APPLIED)
Comparison: MRI abdomen dated 05/25/2021. CTA chest abdomen pelvis
dated 05/10/2021.

CLINICAL DATA: Chest pain radiating into lower back, evaluate for
acute aortic syndrome

EXAM:
CT ANGIOGRAPHY CHEST, ABDOMEN AND PELVIS
TECHNIQUE: Non-contrast CT of the chest was initially obtained.

[Series 5: arterial · axial · arterial · 0.84mm/px · z∈[-1242,-676]mm · 11 of 319 slices shown, 13 images]
[im 18/319  soft-tissue]
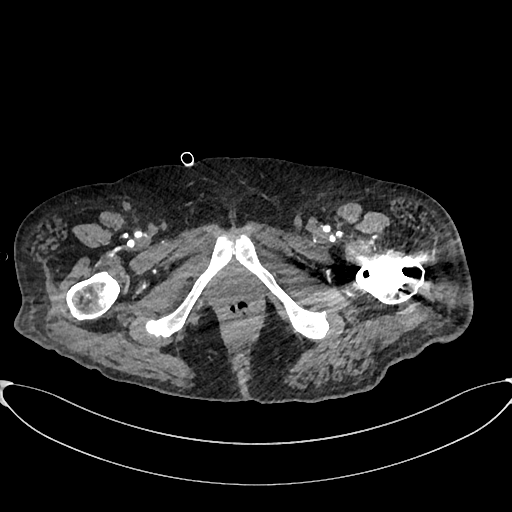
[im 18/319  bone]
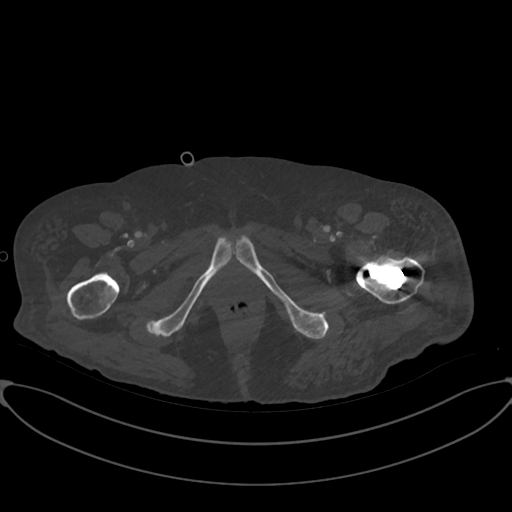
[im 54/319  soft-tissue]
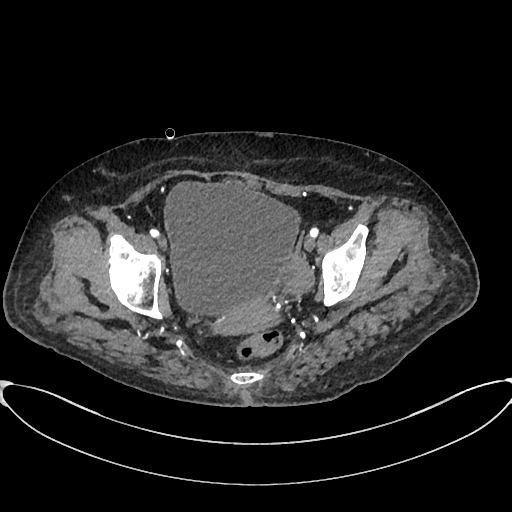
[im 71/319  soft-tissue]
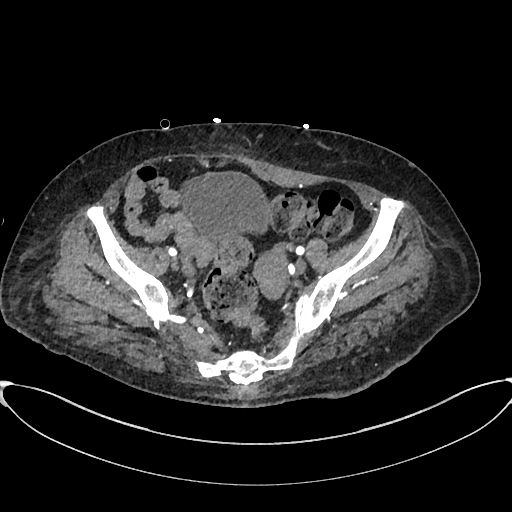
[im 107/319  soft-tissue]
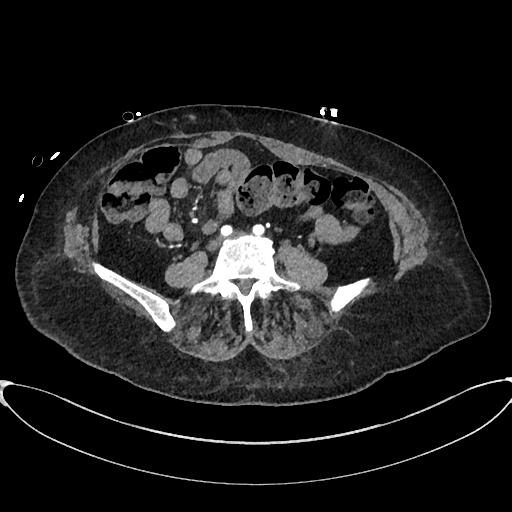
[im 124/319  soft-tissue]
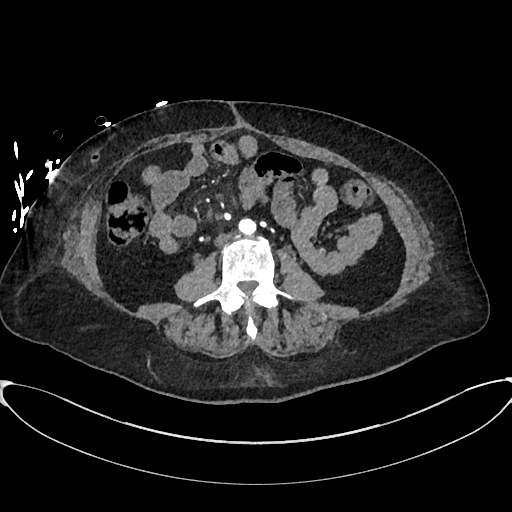
[im 160/319  soft-tissue]
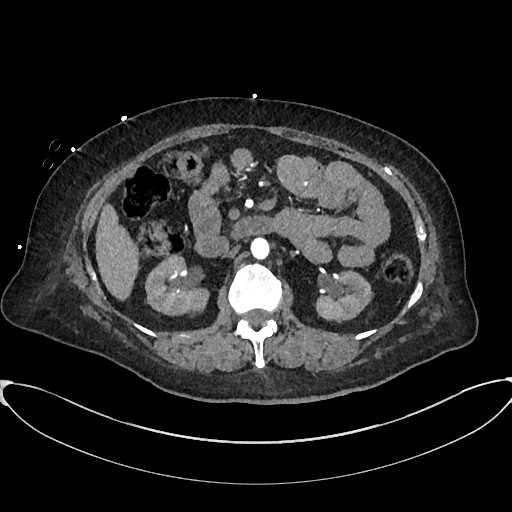
[im 195/319  soft-tissue]
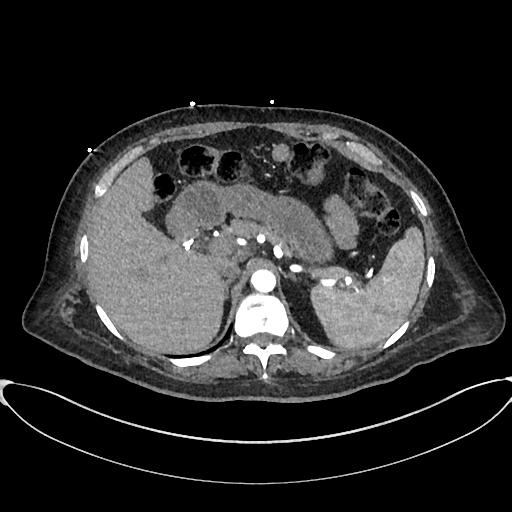
[im 213/319  soft-tissue]
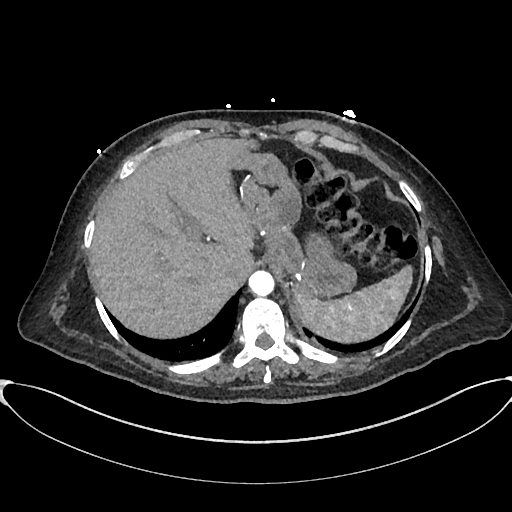
[im 248/319  soft-tissue]
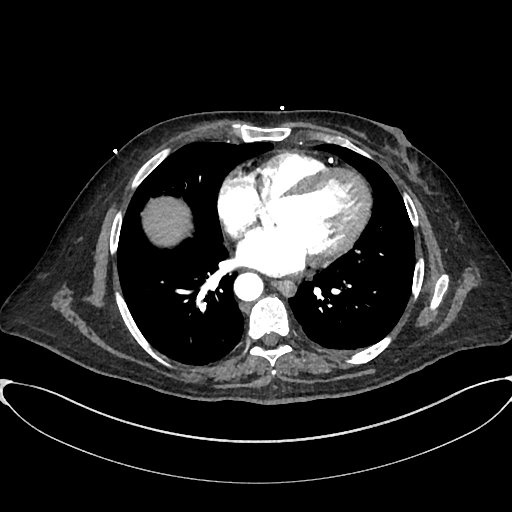
[im 248/319  bone]
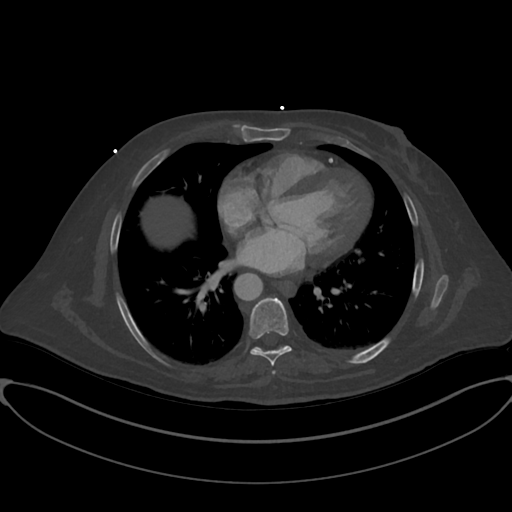
[im 266/319  soft-tissue]
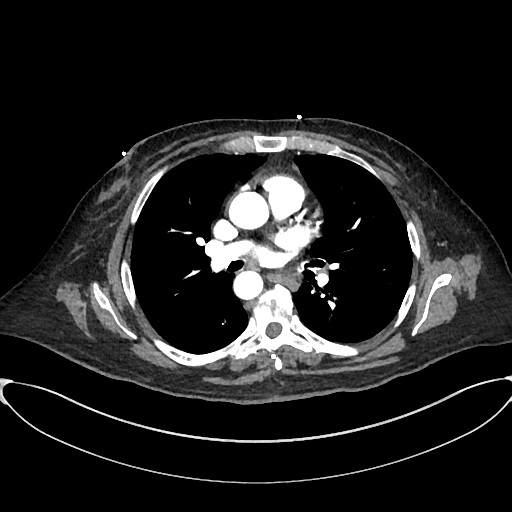
[im 301/319  soft-tissue]
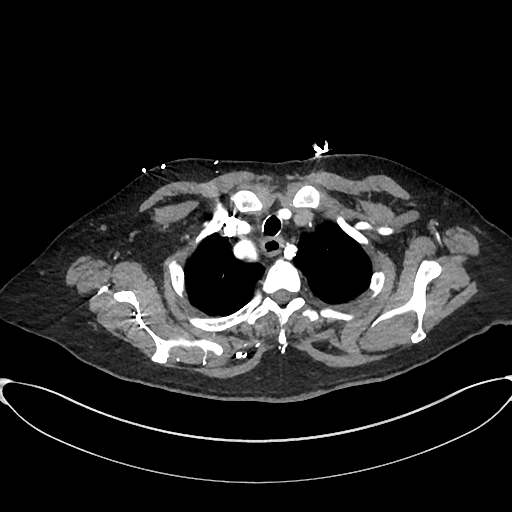

[Series 8: cor · coronal · 0.93mm/px · 3 of 147 slices shown]
[im 37/147  soft-tissue]
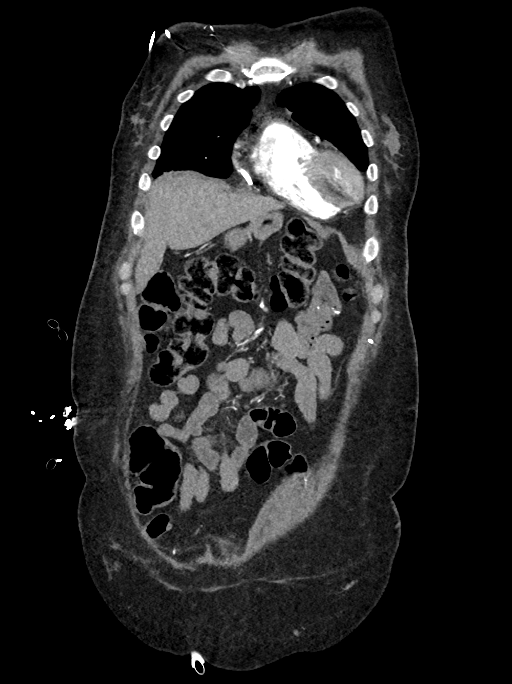
[im 74/147  soft-tissue]
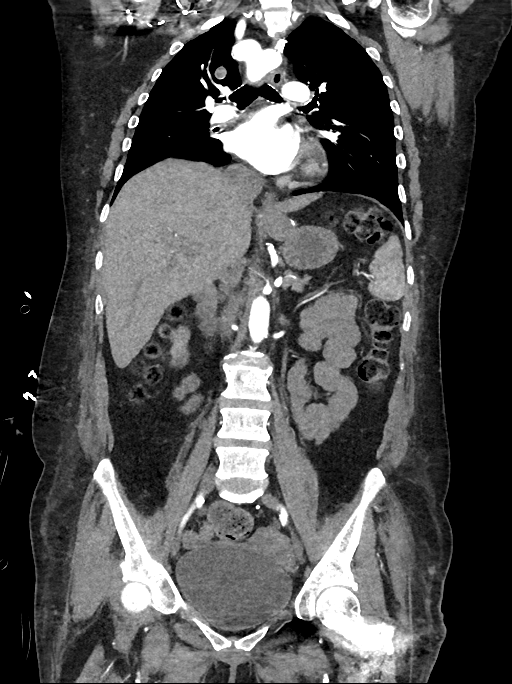
[im 110/147  soft-tissue]
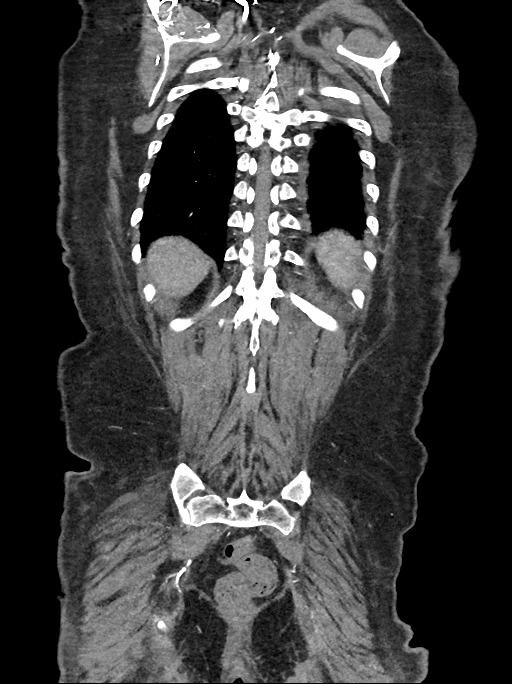

[14 of 46 positions shown; findings below may reference images not displayed]

Multidetector CT imaging through the chest, abdomen and pelvis was
performed using the standard protocol during bolus administration of
intravenous contrast. Multiplanar reconstructed images and MIPs were
obtained and reviewed to evaluate the vascular anatomy.

RADIATION DOSE REDUCTION: This exam was performed according to the
departmental dose-optimization program which includes automated
exposure control, adjustment of the mA and/or kV according to
patient size and/or use of iterative reconstruction technique.

CONTRAST:  80mL OMNIPAQUE IOHEXOL 350 MG/ML SOLN
FINDINGS: CTA CHEST FINDINGS

Cardiovascular: Right aortic arch. On unenhanced CT, there is no
evidence of intramural hematoma.

Prior surgical embolization of an aberrant left subclavian artery to
fix a vascular ring. Surgical bypass graft from the left common
carotid to the left subclavian artery. Following contrast
administration, there is no evidence of thoracic aortic aneurysm or
dissection. Mild atherosclerotic calcifications.

Although not tailored for evaluation of the pulmonary arteries,
there is no evidence of pulmonary embolism.

The heart is normal in size.  No pericardial effusion.

Coronary atherosclerosis of the LAD and right coronary artery.

Mediastinum/Nodes: No suspicious mediastinal lymphadenopathy.

Visualized thyroid is unremarkable.

Lungs/Pleura: No suspicious pulmonary nodules.

No focal consolidation.

Minimal left basilar atelectasis.

Trace left pleural effusion.

No pneumothorax.

Musculoskeletal: Degenerative changes of the thoracic spine.

Review of the MIP images confirms the above findings.

CTA ABDOMEN AND PELVIS FINDINGS

VASCULAR

Aorta: No evidence of thoracoabdominal aortic aneurysm or
dissection. Patent.

Celiac: Patent.  Mild atherosclerotic calcifications.

SMA: Patent.

Renals: Patent bilaterally.

IMA: Patent.

Inflow: Patent bilaterally.  Atherosclerotic calcifications.

Veins: Grossly unremarkable.

Review of the MIP images confirms the above findings.

NON-VASCULAR

Hepatobiliary: Liver is within normal limits.

Status post cholecystectomy. No intrahepatic or extrahepatic ductal
dilatation.

Pancreas: Within normal limits.

Spleen: Within normal limits.

Adrenals/Urinary Tract: Adrenal glands are within normal limits.

Mild left renal cortical scarring. Right kidney is within normal
limits. No hydronephrosis.

Bladder is within normal limits.

Stomach/Bowel: Postsurgical changes related to gastric bypass.

No evidence of bowel obstruction.

Appendix is not discretely visualized.

No colonic wall thickening or inflammatory changes.

Lymphatic: No suspicious abdominopelvic lymphadenopathy.

Reproductive: Uterus is within normal limits.

Bilateral ovaries are within normal limits.

Other: No abdominopelvic ascites.

Musculoskeletal: Injection granulomas along the anterior abdominal
wall.

Status post ORIF of the left hip.

Degenerative changes of the lumbar spine.

Review of the MIP images confirms the above findings.
IMPRESSION: No evidence of thoracoabdominal aortic aneurysm or dissection.

No evidence of central pulmonary embolism.

Trace left pleural effusion.

Additional postsurgical and stable ancillary findings as above.

## 2023-05-15 IMAGING — CR DG CHEST 2V
1 series · 2 of 2 positions shown · non-contrast
Comparison: 05/23/2021

CLINICAL DATA: Chest pain and shortness of breath

EXAM:
CHEST - 2 VIEW

[Series 1: dg chest 2 view · 0.14mm/px · 2 of 2 slices shown]
[im 1/2]
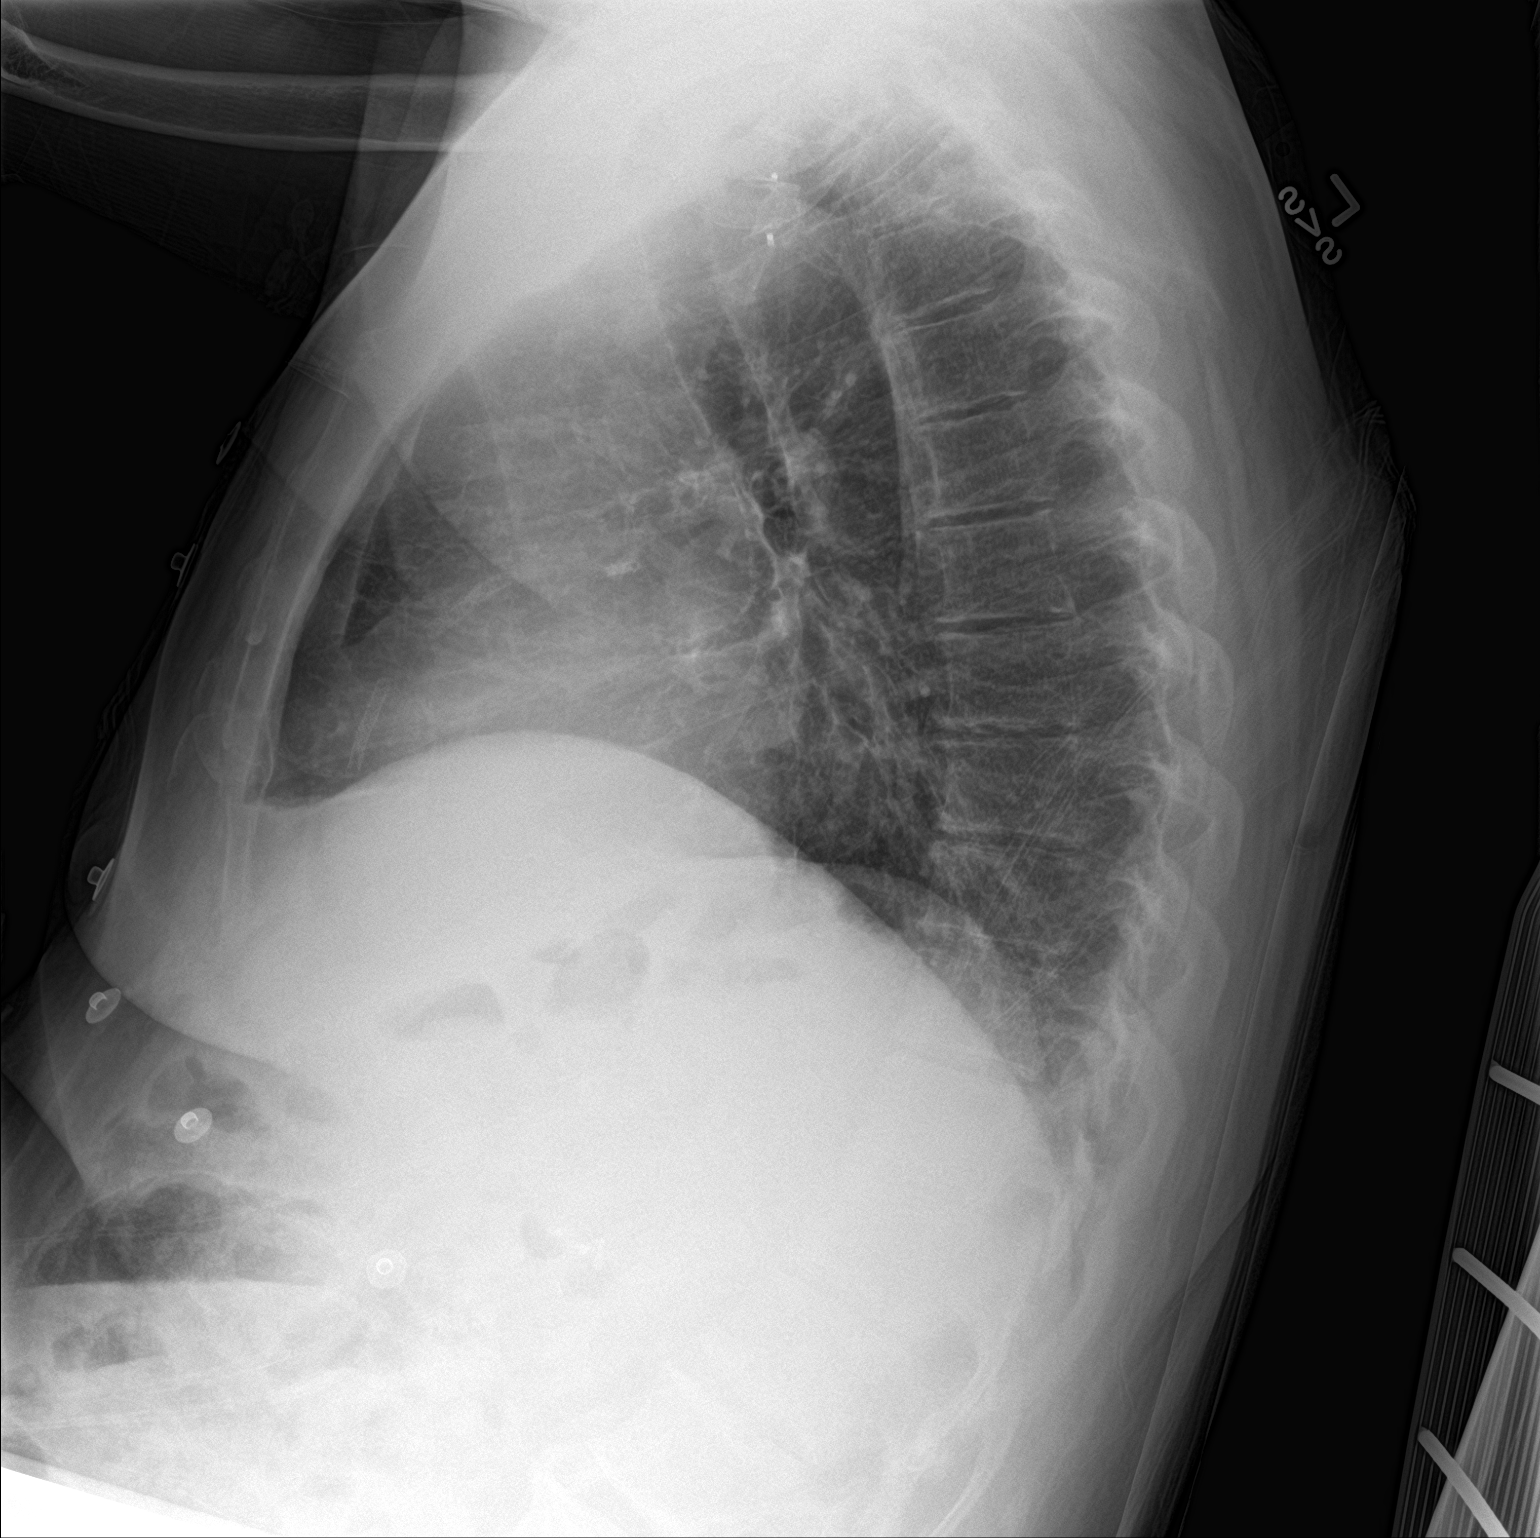
[im 2/2]
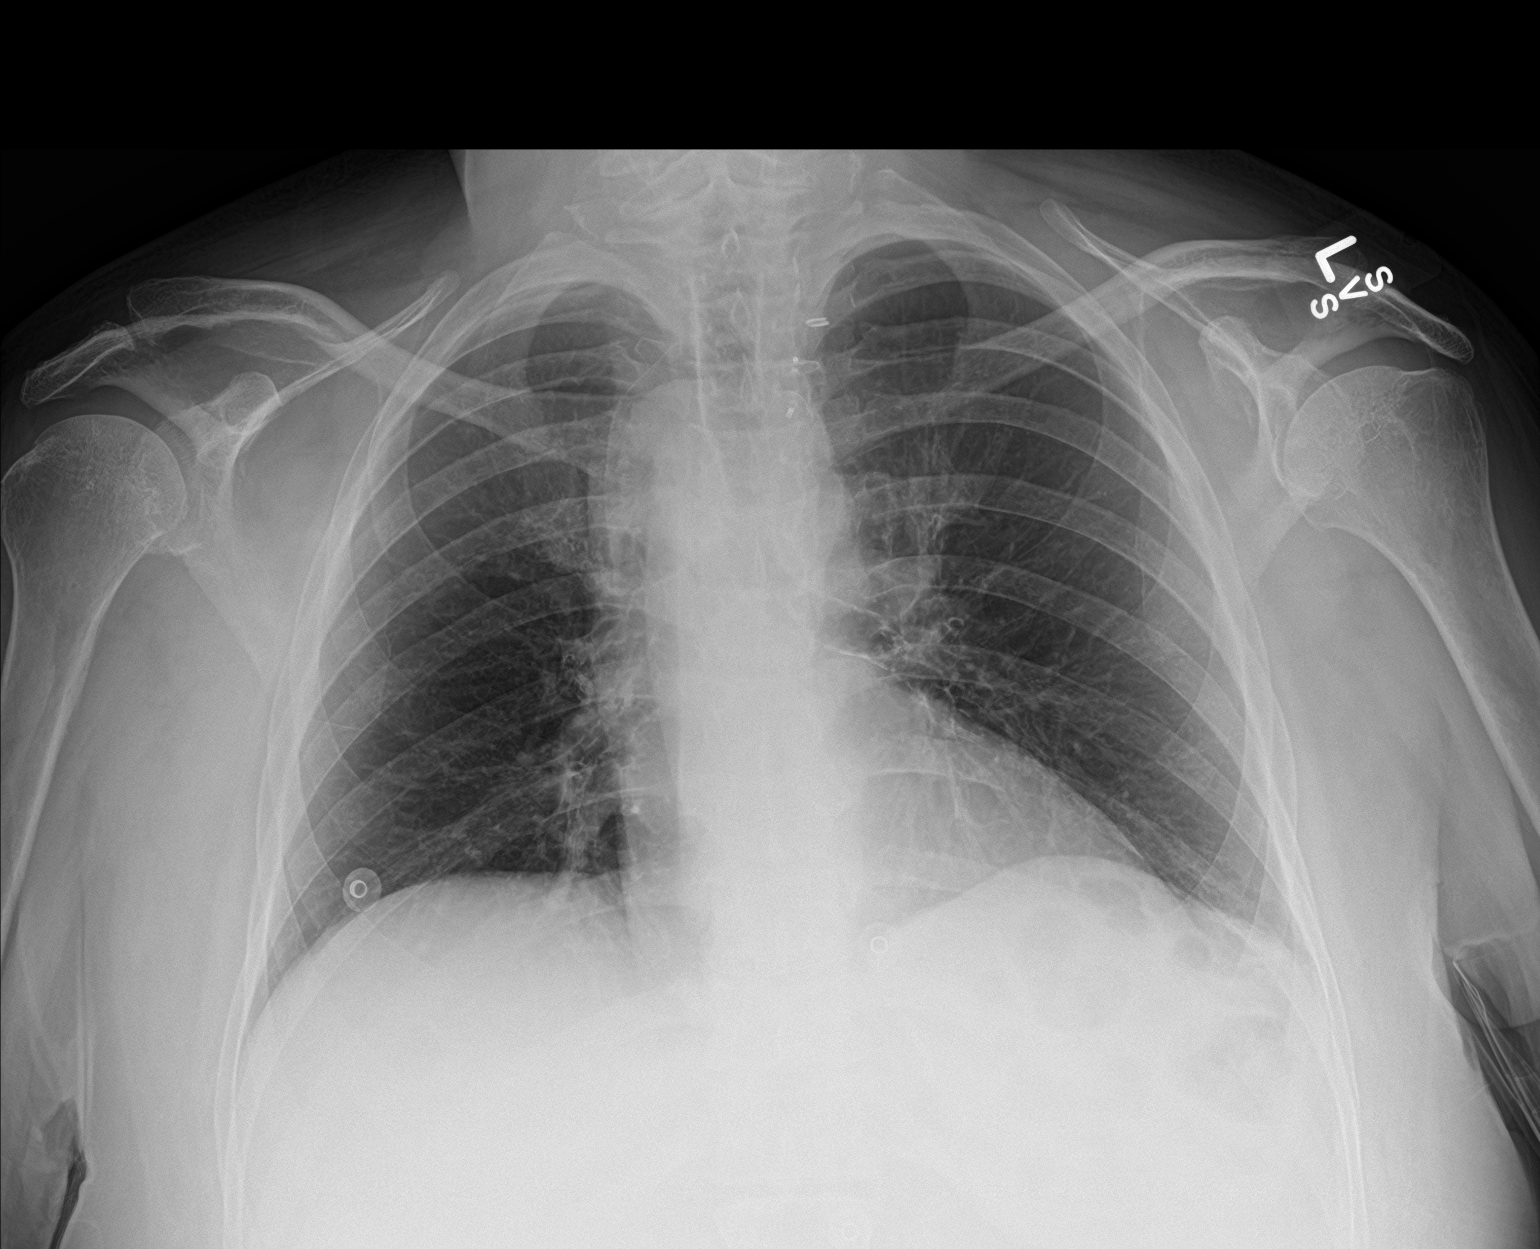

[2 of 2 positions shown; findings below may reference images not displayed]

FINDINGS: The heart size and mediastinal contours are within normal limits.
Both lungs are clear. The visualized skeletal structures are
unremarkable.
IMPRESSION: No active cardiopulmonary disease.
